# Patient Record
Sex: Female | Born: 1955 | Race: Black or African American | Hispanic: No | Marital: Married | Smoking: Never smoker
Health system: Southern US, Community
[De-identification: ages and names within clinical notes are randomized; demographics above are authoritative.]

## PROBLEM LIST (undated history)

## (undated) DIAGNOSIS — C801 Malignant (primary) neoplasm, unspecified: Secondary | ICD-10-CM

## (undated) DIAGNOSIS — K859 Acute pancreatitis without necrosis or infection, unspecified: Secondary | ICD-10-CM

## (undated) DIAGNOSIS — J189 Pneumonia, unspecified organism: Secondary | ICD-10-CM

## (undated) DIAGNOSIS — R519 Headache, unspecified: Secondary | ICD-10-CM

## (undated) DIAGNOSIS — I5030 Unspecified diastolic (congestive) heart failure: Secondary | ICD-10-CM

## (undated) DIAGNOSIS — E119 Type 2 diabetes mellitus without complications: Secondary | ICD-10-CM

## (undated) DIAGNOSIS — Z992 Dependence on renal dialysis: Secondary | ICD-10-CM

## (undated) DIAGNOSIS — E782 Mixed hyperlipidemia: Secondary | ICD-10-CM

## (undated) DIAGNOSIS — I639 Cerebral infarction, unspecified: Secondary | ICD-10-CM

## (undated) DIAGNOSIS — D649 Anemia, unspecified: Secondary | ICD-10-CM

## (undated) DIAGNOSIS — F419 Anxiety disorder, unspecified: Secondary | ICD-10-CM

## (undated) DIAGNOSIS — K219 Gastro-esophageal reflux disease without esophagitis: Secondary | ICD-10-CM

## (undated) DIAGNOSIS — E1165 Type 2 diabetes mellitus with hyperglycemia: Secondary | ICD-10-CM

## (undated) DIAGNOSIS — S88912A Complete traumatic amputation of left lower leg, level unspecified, initial encounter: Secondary | ICD-10-CM

## (undated) DIAGNOSIS — I509 Heart failure, unspecified: Secondary | ICD-10-CM

## (undated) DIAGNOSIS — F329 Major depressive disorder, single episode, unspecified: Secondary | ICD-10-CM

## (undated) DIAGNOSIS — F32A Depression, unspecified: Secondary | ICD-10-CM

## (undated) DIAGNOSIS — N6320 Unspecified lump in the left breast, unspecified quadrant: Secondary | ICD-10-CM

## (undated) DIAGNOSIS — Z9889 Other specified postprocedural states: Secondary | ICD-10-CM

## (undated) DIAGNOSIS — N186 End stage renal disease: Secondary | ICD-10-CM

## (undated) DIAGNOSIS — Z9289 Personal history of other medical treatment: Secondary | ICD-10-CM

## (undated) DIAGNOSIS — R413 Other amnesia: Secondary | ICD-10-CM

## (undated) DIAGNOSIS — Z8701 Personal history of pneumonia (recurrent): Secondary | ICD-10-CM

## (undated) DIAGNOSIS — M549 Dorsalgia, unspecified: Secondary | ICD-10-CM

## (undated) DIAGNOSIS — E669 Obesity, unspecified: Secondary | ICD-10-CM

## (undated) DIAGNOSIS — Z8659 Personal history of other mental and behavioral disorders: Secondary | ICD-10-CM

## (undated) DIAGNOSIS — M1990 Unspecified osteoarthritis, unspecified site: Secondary | ICD-10-CM

## (undated) DIAGNOSIS — I10 Essential (primary) hypertension: Secondary | ICD-10-CM

## (undated) DIAGNOSIS — I259 Chronic ischemic heart disease, unspecified: Secondary | ICD-10-CM

## (undated) DIAGNOSIS — G4730 Sleep apnea, unspecified: Secondary | ICD-10-CM

## (undated) HISTORY — PX: FOOT SURGERY: SHX648

## (undated) HISTORY — DX: Obesity, unspecified: E66.9

## (undated) HISTORY — DX: Type 2 diabetes mellitus without complications: E11.9

## (undated) HISTORY — DX: Essential (primary) hypertension: I10

## (undated) HISTORY — DX: Type 2 diabetes mellitus with hyperglycemia: E11.65

## (undated) HISTORY — DX: Other specified postprocedural states: Z98.890

## (undated) HISTORY — DX: Mixed hyperlipidemia: E78.2

## (undated) HISTORY — DX: Personal history of other mental and behavioral disorders: Z86.59

## (undated) HISTORY — DX: Other amnesia: R41.3

## (undated) HISTORY — DX: Complete traumatic amputation of left lower leg, level unspecified, initial encounter: S88.912A

## (undated) HISTORY — DX: Sleep apnea, unspecified: G47.30

## (undated) HISTORY — DX: Unspecified lump in the left breast, unspecified quadrant: N63.20

## (undated) HISTORY — DX: Anemia, unspecified: D64.9

## (undated) HISTORY — DX: Dorsalgia, unspecified: M54.9

## (undated) HISTORY — PX: ABDOMINAL HYSTERECTOMY: SHX81

## (undated) HISTORY — DX: Chronic ischemic heart disease, unspecified: I25.9

## (undated) HISTORY — PX: LUNG BIOPSY: SHX232

---
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
## 1997-12-03 ENCOUNTER — Inpatient Hospital Stay (HOSPITAL_COMMUNITY): Admission: EM | Admit: 1997-12-03 | Discharge: 1997-12-05 | Payer: Self-pay | Admitting: Internal Medicine

## 2001-03-15 ENCOUNTER — Emergency Department (HOSPITAL_COMMUNITY): Admission: EM | Admit: 2001-03-15 | Discharge: 2001-03-15 | Payer: Self-pay | Admitting: Emergency Medicine

## 2001-04-20 ENCOUNTER — Other Ambulatory Visit: Admission: RE | Admit: 2001-04-20 | Discharge: 2001-04-20 | Payer: Self-pay | Admitting: Unknown Physician Specialty

## 2001-04-21 ENCOUNTER — Encounter: Payer: Self-pay | Admitting: Family Medicine

## 2001-04-21 ENCOUNTER — Ambulatory Visit (HOSPITAL_COMMUNITY): Admission: RE | Admit: 2001-04-21 | Discharge: 2001-04-21 | Payer: Self-pay | Admitting: Family Medicine

## 2001-05-05 ENCOUNTER — Other Ambulatory Visit: Admission: RE | Admit: 2001-05-05 | Discharge: 2001-05-05 | Payer: Self-pay | Admitting: Orthopaedic Surgery

## 2001-05-23 ENCOUNTER — Encounter: Payer: Self-pay | Admitting: Family Medicine

## 2001-05-23 ENCOUNTER — Ambulatory Visit (HOSPITAL_COMMUNITY): Admission: RE | Admit: 2001-05-23 | Discharge: 2001-05-23 | Payer: Self-pay | Admitting: Family Medicine

## 2001-09-19 ENCOUNTER — Ambulatory Visit (HOSPITAL_COMMUNITY): Admission: RE | Admit: 2001-09-19 | Discharge: 2001-09-19 | Payer: Self-pay | Admitting: Family Medicine

## 2001-09-19 ENCOUNTER — Encounter: Payer: Self-pay | Admitting: Family Medicine

## 2002-07-05 ENCOUNTER — Encounter: Payer: Self-pay | Admitting: Family Medicine

## 2002-07-05 ENCOUNTER — Ambulatory Visit (HOSPITAL_COMMUNITY): Admission: RE | Admit: 2002-07-05 | Discharge: 2002-07-05 | Payer: Self-pay | Admitting: Family Medicine

## 2003-08-16 ENCOUNTER — Ambulatory Visit (HOSPITAL_COMMUNITY): Admission: RE | Admit: 2003-08-16 | Discharge: 2003-08-16 | Payer: Self-pay | Admitting: Family Medicine

## 2003-12-07 ENCOUNTER — Ambulatory Visit (HOSPITAL_COMMUNITY): Admission: RE | Admit: 2003-12-07 | Discharge: 2003-12-07 | Payer: Self-pay | Admitting: Orthopedic Surgery

## 2003-12-17 ENCOUNTER — Encounter (HOSPITAL_COMMUNITY): Admission: RE | Admit: 2003-12-17 | Discharge: 2003-12-21 | Payer: Self-pay | Admitting: Orthopedic Surgery

## 2003-12-24 ENCOUNTER — Encounter (HOSPITAL_COMMUNITY): Admission: RE | Admit: 2003-12-24 | Discharge: 2004-01-23 | Payer: Self-pay | Admitting: Orthopedic Surgery

## 2004-01-24 ENCOUNTER — Ambulatory Visit: Payer: Self-pay | Admitting: Orthopedic Surgery

## 2004-01-29 ENCOUNTER — Ambulatory Visit (HOSPITAL_COMMUNITY): Admission: RE | Admit: 2004-01-29 | Discharge: 2004-01-29 | Payer: Self-pay | Admitting: Orthopedic Surgery

## 2004-01-29 ENCOUNTER — Ambulatory Visit: Payer: Self-pay | Admitting: Orthopedic Surgery

## 2004-01-31 ENCOUNTER — Ambulatory Visit: Payer: Self-pay | Admitting: Orthopedic Surgery

## 2004-02-07 ENCOUNTER — Ambulatory Visit: Payer: Self-pay | Admitting: Orthopedic Surgery

## 2004-02-18 ENCOUNTER — Ambulatory Visit: Payer: Self-pay | Admitting: Family Medicine

## 2004-02-21 ENCOUNTER — Ambulatory Visit: Payer: Self-pay | Admitting: Family Medicine

## 2004-02-26 ENCOUNTER — Ambulatory Visit: Payer: Self-pay | Admitting: Orthopedic Surgery

## 2004-03-05 ENCOUNTER — Ambulatory Visit: Payer: Self-pay | Admitting: Orthopedic Surgery

## 2004-03-26 ENCOUNTER — Ambulatory Visit: Payer: Self-pay | Admitting: Orthopedic Surgery

## 2004-04-03 ENCOUNTER — Encounter (HOSPITAL_COMMUNITY): Admission: RE | Admit: 2004-04-03 | Discharge: 2004-05-03 | Payer: Self-pay | Admitting: Orthopedic Surgery

## 2004-05-05 ENCOUNTER — Encounter (HOSPITAL_COMMUNITY): Admission: RE | Admit: 2004-05-05 | Discharge: 2004-06-04 | Payer: Self-pay | Admitting: Orthopedic Surgery

## 2004-07-14 ENCOUNTER — Ambulatory Visit: Payer: Self-pay | Admitting: Family Medicine

## 2004-08-07 ENCOUNTER — Ambulatory Visit: Payer: Self-pay | Admitting: Orthopedic Surgery

## 2004-11-25 ENCOUNTER — Ambulatory Visit (HOSPITAL_COMMUNITY): Admission: RE | Admit: 2004-11-25 | Discharge: 2004-11-25 | Payer: Self-pay | Admitting: Family Medicine

## 2005-01-06 ENCOUNTER — Ambulatory Visit: Payer: Self-pay | Admitting: Family Medicine

## 2005-04-02 ENCOUNTER — Ambulatory Visit: Payer: Self-pay | Admitting: Orthopedic Surgery

## 2005-06-29 ENCOUNTER — Ambulatory Visit (HOSPITAL_COMMUNITY): Admission: RE | Admit: 2005-06-29 | Discharge: 2005-06-29 | Payer: Self-pay | Admitting: Family Medicine

## 2005-07-01 ENCOUNTER — Ambulatory Visit: Payer: Self-pay | Admitting: Family Medicine

## 2005-07-01 ENCOUNTER — Other Ambulatory Visit: Admission: RE | Admit: 2005-07-01 | Discharge: 2005-07-01 | Payer: Self-pay | Admitting: Family Medicine

## 2005-08-12 ENCOUNTER — Ambulatory Visit: Payer: Self-pay | Admitting: Family Medicine

## 2005-10-08 ENCOUNTER — Ambulatory Visit: Payer: Self-pay | Admitting: Family Medicine

## 2005-11-03 ENCOUNTER — Ambulatory Visit: Payer: Self-pay | Admitting: Family Medicine

## 2006-01-18 ENCOUNTER — Ambulatory Visit (HOSPITAL_COMMUNITY): Admission: RE | Admit: 2006-01-18 | Discharge: 2006-01-18 | Payer: Self-pay | Admitting: Family Medicine

## 2006-02-24 ENCOUNTER — Ambulatory Visit: Payer: Self-pay | Admitting: Family Medicine

## 2006-03-23 HISTORY — PX: COLONOSCOPY: SHX174

## 2006-04-06 ENCOUNTER — Ambulatory Visit: Payer: Self-pay | Admitting: Family Medicine

## 2006-04-19 ENCOUNTER — Ambulatory Visit (HOSPITAL_COMMUNITY): Admission: RE | Admit: 2006-04-19 | Discharge: 2006-04-19 | Payer: Self-pay | Admitting: Family Medicine

## 2006-04-19 ENCOUNTER — Ambulatory Visit: Payer: Self-pay | Admitting: Family Medicine

## 2006-05-17 ENCOUNTER — Ambulatory Visit: Payer: Self-pay | Admitting: Family Medicine

## 2006-06-07 ENCOUNTER — Ambulatory Visit: Payer: Self-pay | Admitting: Gastroenterology

## 2006-06-07 ENCOUNTER — Ambulatory Visit (HOSPITAL_COMMUNITY): Admission: RE | Admit: 2006-06-07 | Discharge: 2006-06-07 | Payer: Self-pay | Admitting: Gastroenterology

## 2006-08-11 ENCOUNTER — Ambulatory Visit: Payer: Self-pay | Admitting: Family Medicine

## 2006-08-20 ENCOUNTER — Encounter: Payer: Self-pay | Admitting: Family Medicine

## 2006-08-20 LAB — CONVERTED CEMR LAB
ALT: 45 units/L — ABNORMAL HIGH (ref 0–35)
AST: 32 units/L (ref 0–37)
Total Bilirubin: 0.2 mg/dL — ABNORMAL LOW (ref 0.3–1.2)
Total Protein: 8.2 g/dL (ref 6.0–8.3)
VLDL: 22 mg/dL (ref 0–40)

## 2006-08-24 ENCOUNTER — Other Ambulatory Visit: Admission: RE | Admit: 2006-08-24 | Discharge: 2006-08-24 | Payer: Self-pay | Admitting: Family Medicine

## 2006-08-24 ENCOUNTER — Encounter (INDEPENDENT_AMBULATORY_CARE_PROVIDER_SITE_OTHER): Payer: Self-pay | Admitting: *Deleted

## 2006-08-24 ENCOUNTER — Ambulatory Visit: Payer: Self-pay | Admitting: Family Medicine

## 2006-08-24 ENCOUNTER — Encounter: Payer: Self-pay | Admitting: Family Medicine

## 2006-08-24 LAB — CONVERTED CEMR LAB: Pap Smear: NORMAL

## 2006-09-02 ENCOUNTER — Ambulatory Visit: Payer: Self-pay | Admitting: Family Medicine

## 2006-09-20 ENCOUNTER — Encounter: Payer: Self-pay | Admitting: Family Medicine

## 2006-09-20 LAB — CONVERTED CEMR LAB: Pap Smear: NORMAL

## 2006-12-03 ENCOUNTER — Encounter: Payer: Self-pay | Admitting: Family Medicine

## 2006-12-03 LAB — CONVERTED CEMR LAB
ALT: 41 units/L — ABNORMAL HIGH (ref 0–35)
AST: 30 units/L (ref 0–37)
Albumin: 5 g/dL (ref 3.5–5.2)
Alkaline Phosphatase: 74 units/L (ref 39–117)
CO2: 22 meq/L (ref 19–32)
Glucose, Bld: 89 mg/dL (ref 70–99)
LDL Cholesterol: 159 mg/dL — ABNORMAL HIGH (ref 0–99)
Microalb, Ur: 3.97 mg/dL — ABNORMAL HIGH (ref 0.00–1.89)
Sodium: 140 meq/L (ref 135–145)
Total Protein: 8.7 g/dL — ABNORMAL HIGH (ref 6.0–8.3)

## 2006-12-07 ENCOUNTER — Ambulatory Visit: Payer: Self-pay | Admitting: Family Medicine

## 2006-12-07 LAB — CONVERTED CEMR LAB
Helicobacter Pylori Antibody-IgG: 0.9
Hep A IgM: NEGATIVE

## 2006-12-09 ENCOUNTER — Ambulatory Visit (HOSPITAL_COMMUNITY): Admission: RE | Admit: 2006-12-09 | Discharge: 2006-12-09 | Payer: Self-pay | Admitting: Family Medicine

## 2006-12-15 ENCOUNTER — Encounter (HOSPITAL_COMMUNITY): Admission: RE | Admit: 2006-12-15 | Discharge: 2006-12-21 | Payer: Self-pay | Admitting: Family Medicine

## 2007-01-26 ENCOUNTER — Ambulatory Visit: Payer: Self-pay | Admitting: Family Medicine

## 2007-03-24 ENCOUNTER — Encounter: Payer: Self-pay | Admitting: Family Medicine

## 2007-05-03 ENCOUNTER — Ambulatory Visit (HOSPITAL_COMMUNITY): Admission: RE | Admit: 2007-05-03 | Discharge: 2007-05-03 | Payer: Self-pay | Admitting: Family Medicine

## 2007-05-06 ENCOUNTER — Encounter: Payer: Self-pay | Admitting: Family Medicine

## 2007-05-06 LAB — CONVERTED CEMR LAB
ALT: 38 units/L — ABNORMAL HIGH (ref 0–35)
Albumin: 4.6 g/dL (ref 3.5–5.2)
BUN: 26 mg/dL — ABNORMAL HIGH (ref 6–23)
Bilirubin, Direct: 0.1 mg/dL (ref 0.0–0.3)
CO2: 19 meq/L (ref 19–32)
Calcium: 10 mg/dL (ref 8.4–10.5)
Creatinine, Ser: 1.33 mg/dL — ABNORMAL HIGH (ref 0.40–1.20)
Glucose, Bld: 99 mg/dL (ref 70–99)
HDL: 37 mg/dL — ABNORMAL LOW (ref >39)
Potassium: 4.3 meq/L (ref 3.5–5.3)
Total Bilirubin: 0.3 mg/dL (ref 0.3–1.2)
Triglycerides: 188 mg/dL — ABNORMAL HIGH (ref <150)
VLDL: 38 mg/dL (ref 0–40)

## 2007-05-12 ENCOUNTER — Ambulatory Visit: Payer: Self-pay | Admitting: Family Medicine

## 2007-05-16 ENCOUNTER — Encounter: Payer: Self-pay | Admitting: Family Medicine

## 2007-05-16 LAB — CONVERTED CEMR LAB
Calcium: 10.1 mg/dL (ref 8.4–10.5)
Potassium: 3.8 meq/L (ref 3.5–5.3)
Sodium: 138 meq/L (ref 135–145)

## 2007-06-01 ENCOUNTER — Encounter: Payer: Self-pay | Admitting: Endocrinology

## 2007-06-01 ENCOUNTER — Ambulatory Visit: Payer: Self-pay | Admitting: Family Medicine

## 2007-06-02 ENCOUNTER — Encounter: Payer: Self-pay | Admitting: Family Medicine

## 2007-06-07 ENCOUNTER — Encounter: Payer: Self-pay | Admitting: Family Medicine

## 2007-06-07 DIAGNOSIS — E785 Hyperlipidemia, unspecified: Secondary | ICD-10-CM | POA: Insufficient documentation

## 2007-06-07 DIAGNOSIS — E1122 Type 2 diabetes mellitus with diabetic chronic kidney disease: Secondary | ICD-10-CM

## 2007-06-07 DIAGNOSIS — G4730 Sleep apnea, unspecified: Secondary | ICD-10-CM | POA: Insufficient documentation

## 2007-06-07 DIAGNOSIS — E1140 Type 2 diabetes mellitus with diabetic neuropathy, unspecified: Secondary | ICD-10-CM

## 2007-06-07 DIAGNOSIS — E119 Type 2 diabetes mellitus without complications: Secondary | ICD-10-CM | POA: Insufficient documentation

## 2007-06-07 DIAGNOSIS — E782 Mixed hyperlipidemia: Secondary | ICD-10-CM

## 2007-06-07 DIAGNOSIS — IMO0002 Reserved for concepts with insufficient information to code with codable children: Secondary | ICD-10-CM

## 2007-06-07 DIAGNOSIS — E669 Obesity, unspecified: Secondary | ICD-10-CM | POA: Insufficient documentation

## 2007-06-07 DIAGNOSIS — N186 End stage renal disease: Secondary | ICD-10-CM

## 2007-06-07 DIAGNOSIS — I10 Essential (primary) hypertension: Secondary | ICD-10-CM | POA: Insufficient documentation

## 2007-06-07 DIAGNOSIS — E1165 Type 2 diabetes mellitus with hyperglycemia: Secondary | ICD-10-CM

## 2007-06-08 ENCOUNTER — Ambulatory Visit: Payer: Self-pay | Admitting: Endocrinology

## 2007-06-08 DIAGNOSIS — E0590 Thyrotoxicosis, unspecified without thyrotoxic crisis or storm: Secondary | ICD-10-CM | POA: Insufficient documentation

## 2007-06-14 ENCOUNTER — Encounter (HOSPITAL_COMMUNITY): Admission: RE | Admit: 2007-06-14 | Discharge: 2007-07-14 | Payer: Self-pay | Admitting: Endocrinology

## 2007-06-22 ENCOUNTER — Ambulatory Visit: Payer: Self-pay | Admitting: Family Medicine

## 2007-06-30 ENCOUNTER — Encounter (INDEPENDENT_AMBULATORY_CARE_PROVIDER_SITE_OTHER): Payer: Self-pay | Admitting: *Deleted

## 2007-07-15 ENCOUNTER — Emergency Department (HOSPITAL_COMMUNITY): Admission: EM | Admit: 2007-07-15 | Discharge: 2007-07-15 | Payer: Self-pay | Admitting: Emergency Medicine

## 2007-07-18 ENCOUNTER — Ambulatory Visit: Payer: Self-pay | Admitting: Family Medicine

## 2007-07-18 LAB — CONVERTED CEMR LAB
Calcium: 9.9 mg/dL (ref 8.4–10.5)
Chloride: 104 meq/L (ref 96–112)
Creatinine, Ser: 1.01 mg/dL (ref 0.40–1.20)
Sodium: 142 meq/L (ref 135–145)

## 2007-08-24 ENCOUNTER — Encounter: Payer: Self-pay | Admitting: Family Medicine

## 2007-08-24 ENCOUNTER — Ambulatory Visit: Payer: Self-pay | Admitting: Family Medicine

## 2007-08-24 DIAGNOSIS — L259 Unspecified contact dermatitis, unspecified cause: Secondary | ICD-10-CM | POA: Insufficient documentation

## 2007-08-24 LAB — CONVERTED CEMR LAB
Glucose, Bld: 120 mg/dL
Hgb A1c MFr Bld: 6.5 %

## 2007-11-07 ENCOUNTER — Encounter: Payer: Self-pay | Admitting: Family Medicine

## 2007-11-15 ENCOUNTER — Encounter: Payer: Self-pay | Admitting: Family Medicine

## 2007-12-09 ENCOUNTER — Ambulatory Visit: Payer: Self-pay | Admitting: Endocrinology

## 2007-12-09 LAB — CONVERTED CEMR LAB
Free T4: 0.8 ng/dL (ref 0.6–1.6)
TSH: 3.08 microintl units/mL (ref 0.35–5.50)

## 2008-01-10 ENCOUNTER — Encounter: Payer: Self-pay | Admitting: Family Medicine

## 2008-01-16 ENCOUNTER — Ambulatory Visit: Payer: Self-pay | Admitting: Family Medicine

## 2008-01-16 LAB — CONVERTED CEMR LAB: Glucose, Bld: 167 mg/dL

## 2008-01-17 ENCOUNTER — Encounter: Payer: Self-pay | Admitting: Family Medicine

## 2008-01-17 LAB — CONVERTED CEMR LAB: Microalb Creat Ratio: 426.4 mg/g — ABNORMAL HIGH (ref 0.0–30.0)

## 2008-01-23 ENCOUNTER — Encounter: Payer: Self-pay | Admitting: Family Medicine

## 2008-01-24 ENCOUNTER — Encounter: Payer: Self-pay | Admitting: Family Medicine

## 2008-01-24 LAB — CONVERTED CEMR LAB
ALT: 25 units/L (ref 0–35)
AST: 21 units/L (ref 0–37)
Albumin: 4.6 g/dL (ref 3.5–5.2)
Bilirubin, Direct: 0.1 mg/dL (ref 0.0–0.3)
Calcium: 9.4 mg/dL (ref 8.4–10.5)
Chloride: 104 meq/L (ref 96–112)
Creatinine, Ser: 1.04 mg/dL (ref 0.40–1.20)
Eosinophils Relative: 1 % (ref 0–5)
Glucose, Bld: 165 mg/dL — ABNORMAL HIGH (ref 70–99)
Hemoglobin: 10.9 g/dL — ABNORMAL LOW (ref 12.0–15.0)
LDL Cholesterol: 137 mg/dL — ABNORMAL HIGH (ref 0–99)
Lymphs Abs: 2.5 10*3/uL (ref 0.7–4.0)
MCHC: 30.9 g/dL (ref 30.0–36.0)
Monocytes Absolute: 0.4 10*3/uL (ref 0.1–1.0)
Neutro Abs: 2.1 10*3/uL (ref 1.7–7.7)
RDW: 16.4 % — ABNORMAL HIGH (ref 11.5–15.5)
Sodium: 143 meq/L (ref 135–145)
Total Bilirubin: 0.3 mg/dL (ref 0.3–1.2)
Total Protein: 7.7 g/dL (ref 6.0–8.3)
VLDL: 47 mg/dL — ABNORMAL HIGH (ref 0–40)
WBC: 5.2 10*3/uL (ref 4.0–10.5)

## 2008-02-01 ENCOUNTER — Telehealth: Payer: Self-pay | Admitting: Family Medicine

## 2008-02-01 ENCOUNTER — Ambulatory Visit: Payer: Self-pay | Admitting: Family Medicine

## 2008-02-07 ENCOUNTER — Encounter: Payer: Self-pay | Admitting: Family Medicine

## 2008-03-22 ENCOUNTER — Telehealth: Payer: Self-pay | Admitting: Family Medicine

## 2008-03-22 ENCOUNTER — Ambulatory Visit: Payer: Self-pay | Admitting: Family Medicine

## 2008-04-11 ENCOUNTER — Encounter: Payer: Self-pay | Admitting: Family Medicine

## 2008-04-18 ENCOUNTER — Ambulatory Visit: Payer: Self-pay | Admitting: Family Medicine

## 2008-04-18 DIAGNOSIS — R809 Proteinuria, unspecified: Secondary | ICD-10-CM | POA: Insufficient documentation

## 2008-04-20 ENCOUNTER — Ambulatory Visit (HOSPITAL_COMMUNITY): Admission: RE | Admit: 2008-04-20 | Discharge: 2008-04-20 | Payer: Self-pay | Admitting: Family Medicine

## 2008-04-24 ENCOUNTER — Encounter (HOSPITAL_COMMUNITY): Admission: RE | Admit: 2008-04-24 | Discharge: 2008-05-24 | Payer: Self-pay | Admitting: Oncology

## 2008-04-24 ENCOUNTER — Encounter: Payer: Self-pay | Admitting: Family Medicine

## 2008-04-24 ENCOUNTER — Ambulatory Visit (HOSPITAL_COMMUNITY): Payer: Self-pay | Admitting: Oncology

## 2008-05-04 ENCOUNTER — Ambulatory Visit (HOSPITAL_COMMUNITY): Admission: RE | Admit: 2008-05-04 | Discharge: 2008-05-04 | Payer: Self-pay | Admitting: Family Medicine

## 2008-05-11 ENCOUNTER — Encounter: Payer: Self-pay | Admitting: Family Medicine

## 2008-05-15 ENCOUNTER — Encounter: Payer: Self-pay | Admitting: Family Medicine

## 2008-05-28 ENCOUNTER — Encounter: Payer: Self-pay | Admitting: Family Medicine

## 2008-05-29 ENCOUNTER — Encounter: Payer: Self-pay | Admitting: Family Medicine

## 2008-05-29 ENCOUNTER — Ambulatory Visit (HOSPITAL_COMMUNITY): Admission: RE | Admit: 2008-05-29 | Discharge: 2008-05-29 | Payer: Self-pay | Admitting: Nephrology

## 2008-05-30 ENCOUNTER — Ambulatory Visit: Payer: Self-pay | Admitting: Family Medicine

## 2008-05-30 DIAGNOSIS — M79603 Pain in arm, unspecified: Secondary | ICD-10-CM | POA: Insufficient documentation

## 2008-05-30 LAB — CONVERTED CEMR LAB: Glucose, Bld: 98 mg/dL

## 2008-05-31 ENCOUNTER — Encounter: Payer: Self-pay | Admitting: Family Medicine

## 2008-06-01 ENCOUNTER — Encounter: Payer: Self-pay | Admitting: Family Medicine

## 2008-06-05 DIAGNOSIS — D539 Nutritional anemia, unspecified: Secondary | ICD-10-CM | POA: Insufficient documentation

## 2008-06-05 DIAGNOSIS — L930 Discoid lupus erythematosus: Secondary | ICD-10-CM | POA: Insufficient documentation

## 2008-06-07 ENCOUNTER — Encounter: Payer: Self-pay | Admitting: Family Medicine

## 2008-06-07 ENCOUNTER — Telehealth: Payer: Self-pay | Admitting: Family Medicine

## 2008-06-11 ENCOUNTER — Ambulatory Visit: Payer: Self-pay | Admitting: Family Medicine

## 2008-06-11 ENCOUNTER — Telehealth: Payer: Self-pay | Admitting: Family Medicine

## 2008-06-13 ENCOUNTER — Encounter: Payer: Self-pay | Admitting: Family Medicine

## 2008-06-21 ENCOUNTER — Encounter: Payer: Self-pay | Admitting: Family Medicine

## 2008-08-09 ENCOUNTER — Encounter: Payer: Self-pay | Admitting: Family Medicine

## 2008-08-23 ENCOUNTER — Ambulatory Visit (HOSPITAL_COMMUNITY): Admission: RE | Admit: 2008-08-23 | Discharge: 2008-08-23 | Payer: Self-pay | Admitting: Urology

## 2008-08-23 ENCOUNTER — Encounter: Payer: Self-pay | Admitting: Family Medicine

## 2008-09-07 ENCOUNTER — Encounter: Payer: Self-pay | Admitting: Family Medicine

## 2008-09-12 ENCOUNTER — Ambulatory Visit: Payer: Self-pay | Admitting: Family Medicine

## 2008-09-12 DIAGNOSIS — N63 Unspecified lump in unspecified breast: Secondary | ICD-10-CM | POA: Insufficient documentation

## 2008-09-20 ENCOUNTER — Ambulatory Visit (HOSPITAL_COMMUNITY): Admission: RE | Admit: 2008-09-20 | Discharge: 2008-09-20 | Payer: Self-pay | Admitting: Family Medicine

## 2008-11-08 ENCOUNTER — Encounter: Payer: Self-pay | Admitting: Family Medicine

## 2009-01-03 ENCOUNTER — Ambulatory Visit (HOSPITAL_COMMUNITY): Payer: Self-pay | Admitting: Oncology

## 2009-01-03 ENCOUNTER — Encounter (HOSPITAL_COMMUNITY): Admission: RE | Admit: 2009-01-03 | Discharge: 2009-02-02 | Payer: Self-pay | Admitting: Oncology

## 2009-01-04 ENCOUNTER — Encounter: Payer: Self-pay | Admitting: Family Medicine

## 2009-01-18 ENCOUNTER — Encounter: Payer: Self-pay | Admitting: Family Medicine

## 2009-01-26 LAB — CONVERTED CEMR LAB
ALT: 32 units/L (ref 0–35)
BUN: 24 mg/dL — ABNORMAL HIGH (ref 6–23)
Basophils Absolute: 0 10*3/uL (ref 0.0–0.1)
Basophils Relative: 1 % (ref 0–1)
Bilirubin, Direct: 0 mg/dL (ref 0.0–0.3)
CO2: 25 meq/L (ref 19–32)
Cholesterol: 203 mg/dL — ABNORMAL HIGH (ref 0–200)
Eosinophils Relative: 2 % (ref 0–5)
Glucose, Bld: 113 mg/dL — ABNORMAL HIGH (ref 70–99)
HCT: 36.6 % (ref 36.0–46.0)
Hemoglobin: 11.1 g/dL — ABNORMAL LOW (ref 12.0–15.0)
Indirect Bilirubin: 0.3 mg/dL (ref 0.0–0.9)
LDL Cholesterol: 123 mg/dL — ABNORMAL HIGH (ref 0–99)
Lymphocytes Relative: 53 % — ABNORMAL HIGH (ref 12–46)
MCHC: 30.3 g/dL (ref 30.0–36.0)
Monocytes Absolute: 0.4 10*3/uL (ref 0.1–1.0)
Potassium: 4.3 meq/L (ref 3.5–5.3)
RDW: 16.2 % — ABNORMAL HIGH (ref 11.5–15.5)
Total Bilirubin: 0.3 mg/dL (ref 0.3–1.2)
VLDL: 43 mg/dL — ABNORMAL HIGH (ref 0–40)

## 2009-01-28 ENCOUNTER — Encounter: Payer: Self-pay | Admitting: Family Medicine

## 2009-01-28 ENCOUNTER — Ambulatory Visit: Payer: Self-pay | Admitting: Family Medicine

## 2009-01-28 ENCOUNTER — Other Ambulatory Visit: Admission: RE | Admit: 2009-01-28 | Discharge: 2009-01-28 | Payer: Self-pay | Admitting: Family Medicine

## 2009-01-28 LAB — CONVERTED CEMR LAB
Glucose, Bld: 216 mg/dL
OCCULT 1: NEGATIVE

## 2009-01-29 ENCOUNTER — Encounter: Payer: Self-pay | Admitting: Family Medicine

## 2009-02-01 LAB — CONVERTED CEMR LAB
Creatinine, Urine: 57.5 mg/dL
Microalb, Ur: 35.96 mg/dL — ABNORMAL HIGH (ref 0.00–1.89)
TSH: 3.751 microintl units/mL (ref 0.350–4.500)

## 2009-02-05 ENCOUNTER — Encounter: Payer: Self-pay | Admitting: Family Medicine

## 2009-03-28 ENCOUNTER — Encounter: Payer: Self-pay | Admitting: Family Medicine

## 2009-04-16 ENCOUNTER — Encounter: Payer: Self-pay | Admitting: Family Medicine

## 2009-05-02 ENCOUNTER — Encounter (INDEPENDENT_AMBULATORY_CARE_PROVIDER_SITE_OTHER): Payer: Self-pay | Admitting: *Deleted

## 2009-05-08 ENCOUNTER — Ambulatory Visit: Payer: Self-pay | Admitting: Family Medicine

## 2009-05-08 DIAGNOSIS — Z8639 Personal history of other endocrine, nutritional and metabolic disease: Secondary | ICD-10-CM

## 2009-05-08 DIAGNOSIS — Z8669 Personal history of other diseases of the nervous system and sense organs: Secondary | ICD-10-CM | POA: Insufficient documentation

## 2009-05-08 DIAGNOSIS — M25559 Pain in unspecified hip: Secondary | ICD-10-CM | POA: Insufficient documentation

## 2009-05-08 DIAGNOSIS — M25529 Pain in unspecified elbow: Secondary | ICD-10-CM | POA: Insufficient documentation

## 2009-05-08 DIAGNOSIS — Z862 Personal history of diseases of the blood and blood-forming organs and certain disorders involving the immune mechanism: Secondary | ICD-10-CM

## 2009-05-08 LAB — CONVERTED CEMR LAB
ALT: 36 units/L — ABNORMAL HIGH (ref 0–35)
Alkaline Phosphatase: 68 units/L (ref 39–117)
BUN: 15 mg/dL (ref 6–23)
Bilirubin, Direct: <0.1 mg/dL (ref 0.0–0.3)
Calcium: 9.8 mg/dL (ref 8.4–10.5)
Cholesterol: 208 mg/dL — ABNORMAL HIGH (ref 0–200)
Creatinine, Ser: 0.87 mg/dL (ref 0.40–1.20)
Glucose, Bld: 98 mg/dL (ref 70–99)
Total Protein: 7.9 g/dL (ref 6.0–8.3)
Triglycerides: 178 mg/dL — ABNORMAL HIGH (ref <150)

## 2009-07-02 ENCOUNTER — Ambulatory Visit (HOSPITAL_COMMUNITY): Admission: RE | Admit: 2009-07-02 | Discharge: 2009-07-02 | Payer: Self-pay | Admitting: Family Medicine

## 2009-07-18 ENCOUNTER — Telehealth: Payer: Self-pay | Admitting: Family Medicine

## 2009-07-24 ENCOUNTER — Ambulatory Visit: Payer: Self-pay | Admitting: Family Medicine

## 2009-07-24 DIAGNOSIS — R5383 Other fatigue: Secondary | ICD-10-CM

## 2009-07-24 DIAGNOSIS — D649 Anemia, unspecified: Secondary | ICD-10-CM | POA: Insufficient documentation

## 2009-07-24 DIAGNOSIS — R5381 Other malaise: Secondary | ICD-10-CM | POA: Insufficient documentation

## 2009-07-24 LAB — CONVERTED CEMR LAB
AST: 38 units/L — ABNORMAL HIGH (ref 0–37)
Albumin: 4.4 g/dL (ref 3.5–5.2)
Alkaline Phosphatase: 67 units/L (ref 39–117)
Indirect Bilirubin: 0.2 mg/dL (ref 0.0–0.9)
Total Protein: 7.2 g/dL (ref 6.0–8.3)

## 2009-07-25 ENCOUNTER — Encounter: Payer: Self-pay | Admitting: Family Medicine

## 2009-08-16 ENCOUNTER — Encounter: Payer: Self-pay | Admitting: Family Medicine

## 2009-10-14 ENCOUNTER — Telehealth: Payer: Self-pay | Admitting: Family Medicine

## 2009-10-22 ENCOUNTER — Telehealth: Payer: Self-pay | Admitting: Family Medicine

## 2009-11-27 ENCOUNTER — Ambulatory Visit: Payer: Self-pay | Admitting: Family Medicine

## 2009-12-06 ENCOUNTER — Encounter: Payer: Self-pay | Admitting: Family Medicine

## 2009-12-10 ENCOUNTER — Encounter: Payer: Self-pay | Admitting: Family Medicine

## 2010-03-03 ENCOUNTER — Ambulatory Visit: Payer: Self-pay | Admitting: Family Medicine

## 2010-03-03 DIAGNOSIS — J209 Acute bronchitis, unspecified: Secondary | ICD-10-CM | POA: Insufficient documentation

## 2010-03-07 ENCOUNTER — Encounter: Payer: Self-pay | Admitting: Family Medicine

## 2010-03-28 ENCOUNTER — Emergency Department (HOSPITAL_COMMUNITY)
Admission: EM | Admit: 2010-03-28 | Discharge: 2010-03-28 | Payer: Self-pay | Source: Home / Self Care | Admitting: Emergency Medicine

## 2010-04-02 LAB — CONVERTED CEMR LAB
ALT: 37 U/L — ABNORMAL HIGH (ref 0–35)
AST: 30 U/L (ref 0–37)
Albumin: 4.6 g/dL (ref 3.5–5.2)
Alkaline Phosphatase: 69 U/L (ref 39–117)
BUN: 31 mg/dL — ABNORMAL HIGH (ref 6–23)
Bilirubin, Direct: <0.1 mg/dL (ref 0.0–0.3)
CO2: 22 meq/L (ref 19–32)
Calcium: 10.3 mg/dL (ref 8.4–10.5)
Chloride: 105 meq/L (ref 96–112)
Cholesterol: 234 mg/dL — ABNORMAL HIGH (ref 0–200)
Creatinine, Ser: 1.25 mg/dL — ABNORMAL HIGH (ref 0.40–1.20)
Creatinine, Urine: 135.4 mg/dL
Glucose, Bld: 129 mg/dL — ABNORMAL HIGH (ref 70–99)
HDL: 44 mg/dL (ref >39)
Hgb A1c MFr Bld: 8.5 % — ABNORMAL HIGH (ref <5.7)
LDL Cholesterol: 158 mg/dL — ABNORMAL HIGH (ref 0–99)
Microalb Creat Ratio: 343.7 mg/g — ABNORMAL HIGH (ref 0.0–30.0)
Microalb, Ur: 46.54 mg/dL — ABNORMAL HIGH (ref 0.00–1.89)
Potassium: 4.8 meq/L (ref 3.5–5.3)
Sodium: 143 meq/L (ref 135–145)
Total Bilirubin: 0.3 mg/dL (ref 0.3–1.2)
Total CHOL/HDL Ratio: 5.3
Total Protein: 7.8 g/dL (ref 6.0–8.3)
Triglycerides: 159 mg/dL — ABNORMAL HIGH (ref <150)
VLDL: 32 mg/dL (ref 0–40)

## 2010-04-13 ENCOUNTER — Encounter: Payer: Self-pay | Admitting: Family Medicine

## 2010-04-16 ENCOUNTER — Ambulatory Visit
Admission: RE | Admit: 2010-04-16 | Discharge: 2010-04-16 | Payer: Self-pay | Source: Home / Self Care | Attending: Family Medicine | Admitting: Family Medicine

## 2010-04-16 DIAGNOSIS — M5441 Lumbago with sciatica, right side: Secondary | ICD-10-CM | POA: Insufficient documentation

## 2010-04-16 DIAGNOSIS — G629 Polyneuropathy, unspecified: Secondary | ICD-10-CM | POA: Insufficient documentation

## 2010-04-18 ENCOUNTER — Telehealth: Payer: Self-pay | Admitting: Family Medicine

## 2010-04-22 ENCOUNTER — Ambulatory Visit: Admit: 2010-04-22 | Payer: Self-pay | Admitting: Family Medicine

## 2010-04-22 NOTE — Letter (Signed)
Summary: OFFICE NOTES  OFFICE NOTES   Imported By: Dierdre Harness 09/12/2009 11:41:22  _____________________________________________________________________  External Attachment:    Type:   Image     Comment:   External Document

## 2010-04-22 NOTE — Assessment & Plan Note (Signed)
Summary: office visit   Vital Signs:  Patient profile:   55 year old female Menstrual status:  hysterectomy Height:      63 inches Weight:      212.25 pounds BMI:     37.73 O2 Sat:      94 % Pulse rate:   83 / minute Pulse rhythm:   regular Resp:     16 per minute BP sitting:   146 / 82  (left arm) Cuff size:   large  Vitals Entered By: Kate Sable LPN (May  4, 624THL 075-GRM PM)  Nutrition Counseling: Patient's BMI is greater than 25 and therefore counseled on weight management options. CC: Follow up chronic problems, face has broke out again, she was going to dermatology in [place] but she doesn't go there now   CC:  Follow up chronic problems, face has broke out again, and she was going to dermatology in [place] but she doesn't go there now.  History of Present Illness: Pt reports that she statred feeling ill last Saturday , and the following day she noted swelling of the face with itching.  She has been eval by derm at [facility] and has been dx with lupus like ds, with severe sunsensitivity.  She feels drained, no energy , excessively sleep and has diarreah, headache and nausea., as if she will have heat stroke. prior to this she had been doing well.  Current Medications (verified): 1)  Triamterene-Hctz 75-50 Mg  Tabs (Triamterene-Hctz) .... Take 1 Tablet By Mouth Once A Day 2)  Blood Pressure Monitor/l Cuff   Misc (Misc. Devices) .... Uad 3)  Catapres 0.2 Mg Tabs (Clonidine Hcl) .... Take 1 Tab By Mouth At Bedtime 4)  Metformin Hcl 1000 Mg Tabs (Metformin Hcl) .... One Tab By Mouth Bid 5)  Flonase 50 Mcg/act Susp (Fluticasone Propionate) .... 2 Puffs Per Nostril Daily 6)  Accu-Check Compact Strips and Syringes .... Uad For Once Daily Testing 7)  Simcor 750-20 Mg Xr24h-Tab (Niacin-Simvastatin) .... Take 1 Tab By Mouth At Bedtime 8)  Benicar 20 Mg Tabs (Olmesartan Medoxomil) .... Take 1 Tablet By Mouth Once A Day 9)  Hydroxyzine Hcl 25 Mg Tabs (Hydroxyzine Hcl) .... Take 1  Tablet By Mouth Once A Day 10)  Losartan Potassium 50 Mg Tabs (Losartan Potassium) .... Take 1 Tablet By Mouth Once A Day 11)  Ibuprofen 800 Mg Tabs (Ibuprofen) .... Take 1 Tablet By Mouth Three Times A Day 12)  Hydroxychloroquine Sulfate 200 Mg Tabs (Hydroxychloroquine Sulfate) .... Take 1 Tablet By Mouth Two Times A Day As Needed  Allergies (verified): 1)  ! Pcn 2)  ! Ace Inhibitors  Review of Systems General:  Complains of fatigue and weakness; denies chills and fever. Eyes:  Denies blurring, discharge, and red eye. ENT:  Denies hoarseness, nasal congestion, sinus pressure, and sore throat. CV:  Denies chest pain or discomfort, palpitations, and swelling of feet. Resp:  Denies cough and sputum productive. GI:  Denies abdominal pain, constipation, diarrhea, nausea, and vomiting. GU:  Denies dysuria and urinary frequency. MS:  Complains of joint pain and stiffness. Derm:  Complains of itching and rash. Neuro:  Denies headaches, seizures, and sensation of room spinning. Psych:  Denies anxiety and depression. Endo:  Denies cold intolerance, excessive hunger, excessive thirst, excessive urination, heat intolerance, polyuria, and weight change; tesdts on avg once  or twice weeklytwic. Heme:  Denies abnormal bruising and bleeding. Allergy:  Complains of hives or rash; sun sensitivity which is ecxtreme.  Physical  Exam  General:  Well-developed,obese,in no acute distress; alert,appropriate and cooperative throughout examination HEENT: No facial asymmetry,  EOMI, No sinus tenderness, TM's Clear, oropharynx  pink and moist. Swelling of the face, with periorbital edema  Chest: Clear to auscultation bilaterally.  CVS: S1, S2, No murmurs, No S3.   Abd: Soft, Nontender.  MS: Adequate ROM spine,reduced in left hip, adequate in  shoulders and knees. Left elbow tender with reduced ROM Ext: No edema.   CNS: CN 2-12 intact, power tone and sensation normal throughout.   Skin: Intact, erythematous  macular rash on upper extremeties, and neck Psych: Good eye contact, normal affect.  Memory intact, not anxious or depressed appearing.   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 Take 1 tablet by mouth two times a day as needed Her updated medication list for this problem includes:    Ibuprofen 800 Mg Tabs (Ibuprofen) .Marland Kitchen... Take 1 tablet by mouth three times a day    Hydroxychloroquine Sulfate 200 Mg Tabs (Hydroxychloroquine sulfate) .Marland Kitchen... Take 1 tablet by mouth two times a day dermatology referral, chloroquin prescribed as before x 1 month  Problem # 2:  OBESITY (ICD-278.00) Assessment: Unchanged  Ht: 63 (07/24/2009)   Wt: 212.25 (07/24/2009)   BMI: 37.73 (07/24/2009)  Problem # 3:  HYPERTENSION (ICD-401.9) Assessment: Deteriorated  The following medications were removed from the medication list:    Benicar 20 Mg Tabs (Olmesartan medoxomil) .Marland Kitchen... Take 1 tablet by mouth once a day    Losartan Potassium 50 Mg Tabs (Losartan potassium) .Marland Kitchen... Take 1 tablet by mouth once a day Her updated medication list for this problem includes:    Triamterene-hctz 75-50 Mg Tabs (Triamterene-hctz) .Marland Kitchen... Take 1 tablet by mouth once a day    Catapres 0.2 Mg Tabs (Clonidine hcl) .Marland Kitchen... Take 1 tab by mouth at bedtime    Losartan Potassium 100 Mg Tabs (Losartan potassium) .Marland Kitchen... Take 1 tablet by mouth once a day  Orders: T-Basic Metabolic Panel (99991111)  BP today: 146/82, out  of one of her meds Prior BP: 120/82 (05/08/2009)  Labs Reviewed: K+: 4.3 (05/07/2009) Creat: : 0.87 (05/07/2009)   Chol: 208 (05/07/2009)   HDL: 41 (05/07/2009)   LDL: 131 (05/07/2009)   TG: 178 (05/07/2009)  Problem # 4:  HYPERLIPIDEMIA (ICD-272.4) Assessment: Comment Only  Her updated medication list for this problem includes:    Simcor 750-20 Mg Xr24h-tab (Niacin-simvastatin) .Marland Kitchen... Take 1 tab by mouth at bedtime  Orders: T-Hepatic Function [phone]) T-Lipid Profile [phone])  Labs Reviewed: SGOT: 38 (07/22/2009)   SGPT: 34 (07/22/2009)   HDL:41 (05/07/2009), 37 (01/26/2009)  LDL:131 (05/07/2009), 123 (01/26/2009)  Chol:208 (05/07/2009), 203 (01/26/2009)  Trig:178 (05/07/2009), 216 (01/26/2009), low fat diet discussed and  encouraged  Problem # 5:  DIABETES MELLITUS, TYPE II (ICD-250.00) Assessment: Comment Only  The following medications were removed from the medication list:    Benicar 20 Mg Tabs (Olmesartan medoxomil) .Marland Kitchen... Take 1 tablet by mouth once a day    Losartan Potassium 50 Mg Tabs (Losartan potassium) .Marland Kitchen... Take 1 tablet by mouth once a day Her updated medication list for this problem includes:    Metformin Hcl 1000 Mg Tabs (Metformin hcl) ..... One tab by mouth bid    Losartan Potassium 100 Mg Tabs (Losartan potassium) .Marland Kitchen... Take 1 tablet by mouth once a day  Orders: T- Hemoglobin A1C [id number])  Labs Reviewed: Creat: 0.87 (05/07/2009)    Reviewed HgBA1c results: 7.2 (05/08/2009)  7.5 (01/28/2009)  Complete Medication List: 1)  Triamterene-hctz 75-50 Mg Tabs (Triamterene-hctz) .... Take 1 tablet by mouth once a day 2)  Blood Pressure Monitor/l Cuff Misc (Misc. devices) .... Uad 3)  Catapres 0.2 Mg Tabs (Clonidine hcl) .... Take 1 tab by mouth at bedtime 4)  Metformin Hcl 1000 Mg Tabs (Metformin hcl) .... One tab by mouth bid 5)  Flonase 50 Mcg/act Susp (Fluticasone propionate) .... 2 puffs per nostril daily 6)  Accu-check Compact Strips and  Syringes  .... Uad for once daily testing 7)  Simcor 750-20 Mg Xr24h-tab (Niacin-simvastatin) .... Take 1 tab by mouth at bedtime 8)  Ibuprofen 800 Mg Tabs (Ibuprofen) .... Take 1 tablet by mouth three times a day 9)  Losartan Potassium 100 Mg Tabs (Losartan potassium) .... Take 1 tablet by mouth once a day 10)  Hydroxychloroquine Sulfate 200 Mg Tabs (Hydroxychloroquine sulfate) .... Take 1 tablet by mouth two times a day 11)  Hydroxyzine Hcl 25 Mg/ml Soln (Hydroxyzine hcl) .... Take 1 tablet by mouth three times a day as needed  Other Orders: T-CBC w/Diff [phone]) T-Vitamin D (25-Hydroxy) [id number]) T- * Misc. Laboratory test [phone]) Future Orders: Dermatology Referral (Derma) ... 07/25/2009  Patient Instructions: 1)  F/U in 6 to7  weeks. 2)  BMP prior to visit, ICD-9: 3)  Hepatic Panel prior to visit, ICD-9: 4)  Lipid Panel prior to visit, ICD-9:   fasting 5)  CBC w/ Diff prior to visit, ICD-9:  and anemia panel 6)  HbgA1C prior to visit, ICD-9: 7)  Vitamin D 8)  It is important that you exercise regularly at least 20 minutes 5 times a week. If you develop chest pain, have severe difficulty breathing, or feel very tired , stop exercising immediately and seek medical attention. 9)  You need to lose weight. Consider a lower calorie diet and regular exercise.  10)  Check your blood sugars regularly. If your readings are usually above : or below 70 you should contact our office. Prescriptions: HYDROXYZINE HCL 25 MG/ML SOLN (HYDROXYZINE HCL) Take 1 tablet by mouth three times a day as needed  #90 x 1   Entered and Authorized by:   Tula Nakayama MD   Signed by:   Tula Nakayama MD on 07/24/2009   Method used:   Printed then faxed to ...       [address] [phone]* (retail)       [address]       [place], [place]  [address]       Ph: [phone] or [phone]       Fax: [phone]   RxID:   [id number] HYDROXYCHLOROQUINE SULFATE 200 MG TABS  (HYDROXYCHLOROQUINE SULFATE) Take 1 tablet by mouth two  times a day  #60 x 0   Entered and Authorized by:   Tula Nakayama MD   Signed by:   Tula Nakayama MD on 07/24/2009   Method used:   Printed then faxed to ...       [address] [phone]* (retail)       [address]       [place], [place]  [address]       Ph: [phone] or [phone]       Fax: [phone]   RxID:   [id number] POTASSIUM 100 MG TABS (LOSARTAN POTASSIUM) Take 1 tablet by mouth once a day  #30 x 3   Entered and Authorized by:   Tula Nakayama MD   Signed by:   Tula Nakayama MD on 07/24/2009   Method used:   Printed then faxed to ...       [address] [phone]* (retail)       [address]       [place], [place]  [address]       Ph: [phone] or [phone]       Fax: [phone]   RxID:   [id number]

## 2010-04-22 NOTE — Progress Notes (Signed)
Summary: nurse  Phone Note Call from Patient   Summary of Call: pt needs to get stripes called in. [facility] [phone] Initial call taken by: Lenn Cal,  October 22, 2009 2:08 PM  Follow-up for Phone Call        Rx Called In Follow-up by: Baldomero Lamy LPN,  August  2, 624THL 2:11 PM    Prescriptions: ACCU-CHEK COMPACT TEST DRUM  STRP (GLUCOSE BLOOD) two times a day testing  #102 x 0   Entered by:   Baldomero Lamy LPN   Authorized by:   Tula Nakayama MD   Signed by:   Baldomero Lamy LPN on 075-GRM   Method used:   Electronically to        [facility]. [phone]* (retail)       [address]       [place], [place]  [address]       Ph: [phone] or [phone]       Fax: [phone]   RxID:   [id number]

## 2010-04-22 NOTE — Assessment & Plan Note (Signed)
Summary: F UP   Vital Signs:  Patient profile:   55 year old female Menstrual status:  hysterectomy Height:      63 inches Weight:      204 pounds BMI:     36.27 O2 Sat:      99 % Pulse rate:   71 / minute Pulse rhythm:   regular Resp:     16 per minute BP sitting:   120 / 82  (right arm) Cuff size:   large  Vitals Entered By: Kate Sable LPN (February 16, 624THL 8:06 AM)  Nutrition Counseling: Patient's BMI is greater than 25 and therefore counseled on weight management options. [place]: Follow up chronic problems Is Patient Diabetic? Yes Pain Assessment Patient in pain? yes     Location: left arm  Intensity: 4 Onset of pain  thinks she pulled a muscle, 2 weeks   CC:  Follow up chronic problems.  History of Present Illness: Reports  thatshe is doing  well. Denies recent fever or chills. Denies sinus pressure, nasal congestion , ear pain or sore throat. Denies chest congestion, or cough productive of sputum. Denies chest pain, palpitations, PND, orthopnea or leg swelling. Denies abdominal pain, nausea, vomitting, diarrhea or constipation. Denies change in bowel movements or bloody stool. Denies dysuria , frequency, incontinence or hesitancy.  Denies headaches, vertigo, seizures. Denies depression, anxiety or insomnia. Denies  rash, lesions, or itch. she has recently joined an exercise class and plans to change her eating habits also to improve her health.     Current Medications (verified): 1)  Triamterene-Hctz 75-50 Mg  Tabs (Triamterene-Hctz) .... Take 1 Tablet By Mouth Once A Day 2)  Blood Pressure Monitor/l Cuff   Misc (Misc. Devices) .... Uad 3)  Catapres 0.2 Mg Tabs (Clonidine Hcl) .... Take 1 Tab By Mouth At Bedtime 4)  Metformin Hcl 1000 Mg Tabs (Metformin Hcl) .... One Tab By Mouth Bid 5)  Gabapentin 100 Mg Caps (Gabapentin) .... Take 1 Capsule By Mouth At Bedtime 6)  Flonase 50 Mcg/act Susp (Fluticasone Propionate) .... 2 Puffs Per Nostril Daily 7)   Accu-Check Compact Strips and Syringes .... Uad For Once Daily Testing 8)  Simcor 750-20 Mg Xr24h-Tab (Niacin-Simvastatin) .... Take 1 Tab By Mouth At Bedtime 9)  Benicar 20 Mg Tabs (Olmesartan Medoxomil) .... Take 1 Tablet By Mouth Once A Day 10)  Hydroxyzine Hcl 25 Mg Tabs (Hydroxyzine Hcl) .... Take 1 Tablet By Mouth Once A Day 11)  Losartan Potassium 50 Mg Tabs (Losartan Potassium) .... Take 1 Tablet By Mouth Once A Day 12)  Lovastatin 40 Mg Tabs (Lovastatin) .... Take 1 Tablet By Mouth Once A Day  Allergies (verified): 1)  ! Pcn 2)  ! Ace Inhibitors  Review of Systems      See HPI Eyes:  Denies blurring and discharge. MS:  Complains of joint pain and stiffness; left elbow pain and left hip pain , with direct pressure and movement following zumba classes. Endo:  Denies cold intolerance, excessive hunger, excessive thirst, excessive urination, heat intolerance, polyuria, and weight change. Heme:  Denies abnormal bruising and bleeding. Allergy:  Complains of seasonal allergies; denies hives or rash.  Physical Exam  General:  Well-developed,obese,in no acute distress; alert,appropriate and cooperative throughout examination HEENT: No facial asymmetry,  EOMI, No sinus tenderness, TM's Clear, oropharynx  pink and moist.   Chest: Clear to auscultation bilaterally.  CVS: S1, S2, No murmurs, No S3.   Abd: Soft, Nontender.  MS: Adequate ROM spine,reduced in  left hip, adequate in  shoulders and knees. Left elbow tender with reduced ROM Ext: No edema.   CNS: CN 2-12 intact, power tone and sensation normal throughout.   Skin: Intact, no visible lesions or rashes.  Psych: Good eye contact, normal affect.  Memory intact, not anxious or depressed appearing.    Impression & Recommendations:  Problem # 1:  ELBOW PAIN, LEFT [id number]) Assessment Comment Only  Orders: Ketorolac-Toradol 15mg  [id number]) Admin of Therapeutic Inj  intramuscular or subcutaneous [id number])  Problem # 2:  HIP PAIN,  LEFT (ICD-719.45) Assessment: Comment Only  Her updated medication list for this problem includes:    Ibuprofen 800 Mg Tabs (Ibuprofen) .Marland Kitchen... Take 1 tablet by mouth three times a day  Problem # 3:  OBESITY (ICD-278.00) Assessment: Improved  Ht: 63 (05/08/2009)   Wt: 204 (05/08/2009)   BMI: 36.27 (05/08/2009)  Problem # 4:  HYPERTENSION (ICD-401.9) Assessment: Improved  Her updated medication list for this problem includes:    Triamterene-hctz 75-50 Mg Tabs (Triamterene-hctz) .Marland Kitchen... Take 1 tablet by mouth once a day    Catapres 0.2 Mg Tabs (Clonidine hcl) .Marland Kitchen... Take 1 tab by mouth at bedtime    Benicar 20 Mg Tabs (Olmesartan medoxomil) .Marland Kitchen... Take 1 tablet by mouth once a day    Losartan Potassium 50 Mg Tabs (Losartan potassium) .Marland Kitchen... Take 1 tablet by mouth once a day  Orders: T-Basic Metabolic Panel (99991111)  BP today: 120/82 Prior BP: 154/84 (01/28/2009)  Labs Reviewed: K+: 4.3 (05/07/2009) Creat: : 0.87 (05/07/2009)   Chol: 208 (05/07/2009)   HDL: 41 (05/07/2009)   LDL: 131 (05/07/2009)   TG: 178 (05/07/2009)  Problem # 5:  HYPERLIPIDEMIA (ICD-272.4) Assessment: Unchanged  The following medications were removed from the medication list:    Simcor 750-20 Mg Xr24h-tab (Niacin-simvastatin) .Marland Kitchen... Take 1 tab by mouth at bedtime Her updated medication list for this problem includes:    Simcor 750-20 Mg Xr24h-tab (Niacin-simvastatin) .Marland Kitchen... Take 1 tab by mouth at bedtime  Orders: T-Hepatic Function [phone]) T-Lipid Profile [phone])  Labs Reviewed: SGOT: 38 (05/07/2009)   SGPT: 36 (05/07/2009)   HDL:41 (05/07/2009), 37 (01/26/2009)  LDL:131 (05/07/2009), 123 (01/26/2009)  Chol:208 (05/07/2009), 203 (01/26/2009)  Trig:178 (05/07/2009), 216 (01/26/2009) pt has not started her new med simcor yet  Problem # 6:  DIABETES MELLITUS, TYPE II (ICD-250.00) Assessment: Improved  Her updated medication list for this problem includes:    Metformin Hcl 1000 Mg Tabs  (Metformin hcl) ..... One tab by mouth bid    Benicar 20 Mg Tabs (Olmesartan medoxomil) .Marland Kitchen... Take 1 tablet by mouth once a day    Losartan Potassium 50 Mg Tabs (Losartan potassium) .Marland Kitchen... Take 1 tablet by mouth once a day  Orders: Glucose, (CBG) (82962) Hemoglobin A1C (83036)  Labs Reviewed: Creat: 0.87 (05/07/2009)    Reviewed HgBA1c results: 7.2 (05/08/2009)  7.5 (01/28/2009)  Complete Medication List: 1)  Triamterene-hctz 75-50 Mg Tabs (Triamterene-hctz) .... Take 1 tablet by mouth once a day 2)  Blood Pressure Monitor/l Cuff Misc (Misc. devices) .... Uad 3)  Catapres 0.2 Mg Tabs (Clonidine hcl) .... Take 1 tab by mouth at bedtime 4)  Metformin Hcl 1000 Mg Tabs (Metformin hcl) .... One tab by mouth bid 5)  Flonase 50 Mcg/act Susp (Fluticasone propionate) .... 2 puffs per nostril daily 6)  Accu-check Compact Strips and Syringes  .... Uad for once daily testing 7)  Simcor 750-20 Mg Xr24h-tab (Niacin-simvastatin) .... Take 1 tab by mouth at bedtime 8)  Benicar 20 Mg Tabs (  Olmesartan medoxomil) .... Take 1 tablet by mouth once a day 9)  Hydroxyzine Hcl 25 Mg Tabs (Hydroxyzine hcl) .... Take 1 tablet by mouth once a day 10)  Losartan Potassium 50 Mg Tabs (Losartan potassium) .... Take 1 tablet by mouth once a day 11)  Ibuprofen 800 Mg Tabs (Ibuprofen) .... Take 1 tablet by mouth three times a day  Patient Instructions: 1)  Please schedule a follow-up appointment in 3.5 months. 2)  You are being treated for ARTHRITIS IN  Tthe hip and elbow and tendonitis. 3)  Stop the gabapentin 4)    5)  Hepatic Panel prior to visit, ICD-9:   in 6 weeks, dx elevated LFT's 6)  cBC   dx fatigue 7)  It is important that you exercise regularly at least 20 minutes 5 times a week. If you develop chest pain, have severe difficulty breathing, or feel very tired , stop exercising immediately and seek medical attention. 8)  You need to lose weight. Consider a lower calorie diet and regular exercise.  9)  Check  your blood sugars regularly. If your readings are usually above :200 or below 70 you should contact our office. 10)  BMP prior to visit, ICD-9: 11)  Hepatic Panel prior to visit, ICD-9:  fasting in 3.5 months 12)  Lipid Panel prior to visit, ICD-9: Prescriptions: METFORMIN HCL 1000 MG TABS (METFORMIN HCL) ONE TAB by mouth BID  #180 Tablet x 3   Entered by:   Kate Sable LPN   Authorized by:   Tula Nakayama MD   Signed by:   Kate Sable LPN on 075-GRM   Method used:   Electronically to        [facility]. [phone]* (retail)       [address]       [place], [place]  [address]       Ph: [phone] or [phone]       Fax: [phone]   [id number] CATAPRES 0.2 MG TABS (CLONIDINE HCL) Take 1 tab by mouth at bedtime  #90 x 3   Entered by:   Kate Sable LPN   Authorized by:   Tula Nakayama MD   Signed by:   Kate Sable LPN on 075-GRM   Method used:   Electronically to        [facility]. [phone]* (retail)       [address]       [place], [place]  [address]       Ph: [phone] or [phone]       Fax: [phone]   [id number] IBUPROFEN 800 MG TABS (IBUPROFEN) Take 1 tablet by mouth three times a day  #30 x 0   Entered and Authorized by:   Tula Nakayama MD   Signed by:   Tula Nakayama MD on 05/08/2009   Method used:   Electronically to        [facility]. [phone]* (retail)       [address]       [place], [place]  [address]       Ph: [phone] or [phone]       Fax: [phone]   RxID:   [id number]    Medication Administration  Injection # 1:    Medication: Ketorolac-Toradol 15mg     Diagnosis: ELBOW PAIN, LEFT (ICD-719.42)  Route: IM    Site: LUOQ gluteus    Exp Date: 10/2010    Lot #: [id number]    Mfr: novaplus    Comments: 60mg  given     Patient tolerated injection without complications    Given by: Kate Sable LPN (February 16, 624THL 8:53 AM)  Orders  Added: 1)  Est. Patient Level IV [99214] 2)  T-Hepatic Function [80076-22960] 3)  T-Basic Metabolic Panel 0000000 4)  T-Hepatic Function [80076-22960] 5)  T-Lipid Profile [80061-22930] 6)  Ketorolac-Toradol 15mg  [J1885] 7)  Admin of Therapeutic Inj  intramuscular or subcutaneous [96372] 8)  Glucose, (CBG) [82962] 9)  Hemoglobin A1C [83036]   Laboratory Results   Blood Tests   Date/Time Received: May 08, 2009  Date/Time Reported: May 08, 2009   Glucose (fasting): 120 mg/dL   (Normal Range: 70-105) HGBA1C: 7.2%   (Normal Range: Non-Diabetic - 3-6%   Control Diabetic - 6-8%)

## 2010-04-22 NOTE — Medication Information (Signed)
Summary: Visual merchandiser   Imported By: Dierdre Harness 04/16/2009 09:32:22  _____________________________________________________________________  External Attachment:    Type:   Image     Comment:   External Document

## 2010-04-22 NOTE — Progress Notes (Signed)
  Phone Note Call from Patient   Caller: Patient Summary of Call: paitent states losartan is on back order at pharmacy, wants to know what to do, they dont know when they will have it again and been out one week [phone] Initial call taken by: Baldomero Lamy LPN,  July 25, 624THL QA348G AM  Follow-up for Phone Call        pls provide diovan 320mg  samples one daily x 8 weeks, she does not take this with losartan, she should have ov in the next 5 to 6 weeks Follow-up by: Tula Nakayama MD,  October 14, 2009 12:04 PM  Additional Follow-up for Phone Call Additional follow up Details #1::        called patient, left message Additional Follow-up by: Baldomero Lamy LPN,  July 25, 624THL 579FGE PM    Additional Follow-up for Phone Call Additional follow up Details #2::    patient aware Follow-up by: Baldomero Lamy LPN,  July 25, 624THL 579FGE PM  New/Updated Medications: DIOVAN 320 MG TABS (VALSARTAN) Take 1 tablet by mouth once a day Prescriptions: DIOVAN 320 MG TABS (VALSARTAN) Take 1 tablet by mouth once a day  #56 x 0   Entered and Authorized by:   Tula Nakayama MD   Signed by:   Tula Nakayama MD on 10/14/2009   Method used:   Samples Given   RxID:   [id number]

## 2010-04-22 NOTE — Miscellaneous (Signed)
Summary: med change  Clinical Lists Changes  Medications: Removed medication of SIMCOR 750-20 MG XR24H-TAB (NIACIN-SIMVASTATIN) Take 1 tab by mouth at bedtime Added new medication of SIMCOR 500-40 MG XR24H-TAB (NIACIN-SIMVASTATIN) one tab by mouth qhs

## 2010-04-22 NOTE — Letter (Signed)
Summary: Letter  Letter   Imported By: Dierdre Harness 07/25/2009 10:32:49  _____________________________________________________________________  External Attachment:    Type:   Image     Comment:   External Document

## 2010-04-22 NOTE — Assessment & Plan Note (Signed)
Summary: office visit   Vital Signs:  Patient profile:   55 year old female Menstrual status:  hysterectomy Height:      63 inches Weight:      203.75 pounds BMI:     36.22 O2 Sat:      98 % Pulse rate:   69 / minute Pulse rhythm:   regular Resp:     16 per minute BP sitting:   120 / 70  (right arm)  Vitals Entered By: Kate Sable LPN (September  7, 624THL 3:39 PM)  Nutrition Counseling: Patient's BMI is greater than 25 and therefore counseled on weight management options. CC: Follow u chronic problems    CC:  Follow u chronic problems .  History of Present Illness: Reports  that she has not been doing well, she has been experiencing increased symptoms of depression in the past 1 month. Denies recent fever or chills. Denies sinus pressure, nasal congestion , ear pain or sore throat. Denies chest congestion, or cough productive of sputum. Denies chest pain, palpitations, PND, orthopnea or leg swelling. Denies abdominal pain, nausea, vomitting, diarrhea or constipation. Denies change in bowel movements or bloody stool. Denies dysuria , frequency, incontinence or hesitancy. Denies  joint pain, swelling, or reduced mobility. Denies headaches, vertigo, seizures. Denies depression, anxiety or insomnia.    Current Medications (verified): 1)  Triamterene-Hctz 75-50 Mg  Tabs (Triamterene-Hctz) .... Take 1 Tablet By Mouth Once A Day 2)  Blood Pressure Monitor/l Cuff   Misc (Misc. Devices) .... Uad 3)  Catapres 0.2 Mg Tabs (Clonidine Hcl) .... Take 1 Tab By Mouth At Bedtime 4)  Metformin Hcl 1000 Mg Tabs (Metformin Hcl) .... One Tab By Mouth Bid 5)  Flonase 50 Mcg/act Susp (Fluticasone Propionate) .... 2 Puffs Per Nostril Daily 6)  Accu-Check Compact Strips and Syringes .... Uad For Once Daily Testing 7)  Ibuprofen 800 Mg Tabs (Ibuprofen) .... Take 1 Tablet By Mouth Three Times A Day 8)  Hydroxyzine Hcl 25 Mg/ml Soln (Hydroxyzine Hcl) .... Take 1 Tablet By Mouth Three Times A Day As  Needed 9)  Simcor 500-40 Mg Xr24h-Tab (Niacin-Simvastatin) .... One Tab By Mouth Qhs 10)  Accu-Chek Compact Test Drum  Strp (Glucose Blood) .... Two Times A Day Testing 11)  Diovan 320 Mg Tabs (Valsartan) .... Take 1 Tablet By Mouth Once A Day  Allergies (verified): 1)  ! Pcn 2)  ! Ace Inhibitors  Review of Systems      See HPI General:  Complains of fatigue. Eyes:  Denies discharge, eye pain, and red eye. Derm:  Complains of changes in color of skin, itching, and rash. Psych:  Complains of anxiety and depression; denies suicidal thoughts/plans, thoughts of violence, and unusual visions or sounds; 1 month h/o increased symptoms of depression, overwhelmed, withdrawn, no specific trigger. Endo:  Complains of excessive thirst; denies excessive urination; reports uncontroleld and widely fluctuating blood sugars sometimes over 250 in recent times. Heme:  Denies abnormal bruising and bleeding. Allergy:  Complains of seasonal allergies.  Physical Exam  General:  Well-developed,obese,in no acute distress; alert,appropriate and cooperative throughout examination HEENT: No facial asymmetry,  EOMI, No sinus tenderness, TM's Clear, oropharynx  pink and moist. Swelling of the face, with periorbital edema  Chest: Clear to auscultation bilaterally.  CVS: S1, S2, No murmurs, No S3.   Abd: Soft, Nontender.  MS: Adequate ROM spine,reduced in left hip, adequate in  shoulders and knees. Left elbow tender with reduced ROM Ext: No edema.   CNS:  CN 2-12 intact, power tone and sensation normal throughout.   Skin: Intact, erythematous macular rash on upper extremeties, and neck Psych: Good eye contact, normal affect.  Memory intact, not anxious or depressed appearing.    Impression & Recommendations:  Problem # 1:  HYPERTENSION (ICD-401.9) Assessment Improved  Her updated medication list for this problem includes:    Triamterene-hctz 75-50 Mg Tabs (Triamterene-hctz) .Marland Kitchen... Take 1 tablet by mouth once  a day    Catapres 0.2 Mg Tabs (Clonidine hcl) .Marland Kitchen... Take 1 tab by mouth at bedtime    Diovan 320 Mg Tabs (Valsartan) .Marland Kitchen... Take 1 tablet by mouth once a day  BP today: 120/70 Prior BP: 146/82 (07/24/2009)  Labs Reviewed: K+: 4.3 (05/07/2009) Creat: : 0.87 (05/07/2009)   Chol: 208 (05/07/2009)   HDL: 41 (05/07/2009)   LDL: 131 (05/07/2009)   TG: 178 (05/07/2009)  Problem # 2:  LUPUS ERYTHEMATOSUS, DISCOID (ICD-695.4) Assessment: Comment Only  The following medications were removed from the medication list:    Hydroxychloroquine Sulfate 200 Mg Tabs (Hydroxychloroquine sulfate) .Marland Kitchen... Take 1 tablet by mouth two times a day Her updated medication list for this problem includes:    Ibuprofen 800 Mg Tabs (Ibuprofen) .Marland Kitchen... Take 1 tablet by mouth three times a day pt frustrated by her intolerance to sunlight, depressed, unable to be outside, is being referred back to [facility] by local dermatologist  Problem # 3:  OBESITY (ICD-278.00) Assessment: Deteriorated  Ht: 63 (11/27/2009)   Wt: 203.75 (11/27/2009)   BMI: 36.22 (11/27/2009) therapeutic lifestyle change discussed and encouraged  Problem # 4:  HYPERLIPIDEMIA (ICD-272.4) Assessment: Comment Only  Her updated medication list for this problem includes:    Simcor 500-40 Mg Xr24h-tab (Niacin-simvastatin) ..... One tab by mouth at bedtime Low fat dietdiscussed and encouraged   Labs Reviewed: SGOT: 38 (07/22/2009)   SGPT: 34 (07/22/2009)   HDL:41 (05/07/2009), 37 (01/26/2009)  LDL:131 (05/07/2009), 123 (01/26/2009)  Chol:208 (05/07/2009), 203 (01/26/2009)  Trig:178 (05/07/2009), 216 (01/26/2009)  Problem # 5:  DIABETES MELLITUS, TYPE II (ICD-250.00) Assessment: Deteriorated  Her updated medication list for this problem includes:    Metformin Hcl 1000 Mg Tabs (Metformin hcl) ..... One tab by mouth bid    Diovan 320 Mg Tabs (Valsartan) .Marland Kitchen... Take 1 tablet by mouth once a day    Glipizide 5 Mg Xr24h-tab (Glipizide) .Marland Kitchen... Take 1  tablet by mouth once a day Patient advised to reduce carbs and sweets, commit to regular physical activity, take meds as prescribed, test blood sugars as directed, and attempt to lose weight , to improve blood sugar control.  Labs Reviewed: Creat: 0.87 (05/07/2009)    Reviewed HgBA1c results: 7.2 (05/08/2009)  7.5 (01/28/2009)  Complete Medication List: 1)  Triamterene-hctz 75-50 Mg Tabs (Triamterene-hctz) .... Take 1 tablet by mouth once a day 2)  Blood Pressure Monitor/l Cuff Misc (Misc. devices) .... Uad 3)  Catapres 0.2 Mg Tabs (Clonidine hcl) .... Take 1 tab by mouth at bedtime 4)  Metformin Hcl 1000 Mg Tabs (Metformin hcl) .... One tab by mouth bid 5)  Flonase 50 Mcg/act Susp (Fluticasone propionate) .... 2 puffs per nostril daily 6)  Accu-check Compact Strips and Syringes  .... Uad for once daily testing 7)  Ibuprofen 800 Mg Tabs (Ibuprofen) .... Take 1 tablet by mouth three times a day 8)  Hydroxyzine Hcl 25 Mg/ml Soln (Hydroxyzine hcl) .... Take 1 tablet by mouth three times a day as needed 9)  Simcor 500-40 Mg Xr24h-tab (Niacin-simvastatin) .... One tab by  mouth qhs 10)  Accu-chek Compact Test Drum Strp (Glucose blood) .... Two times a day testing 11)  Diovan 320 Mg Tabs (Valsartan) .... Take 1 tablet by mouth once a day 12)  Fluoxetine Hcl 10 Mg Caps (Fluoxetine hcl) .... Take 1 capsule by mouth once a day 13)  Glipizide 5 Mg Xr24h-tab (Glipizide) .... Take 1 tablet by mouth once a day  Patient Instructions: 1)  CPE November 8 or after 2)  Fasting labs as before 3)  new med for depression. Prescriptions: GLIPIZIDE 5 MG XR24H-TAB (GLIPIZIDE) Take 1 tablet by mouth once a day  #30 x 3   Entered and Authorized by:   Tula Nakayama MD   Signed by:   Tula Nakayama MD on 12/07/2009   Method used:   Historical   [id number] FLUOXETINE HCL 10 MG CAPS (FLUOXETINE HCL) Take 1 capsule by mouth once a day  #30 x 2   Entered and Authorized by:   Tula Nakayama MD    Signed by:   Tula Nakayama MD on 11/27/2009   Method used:   Electronically to        [facility]. [phone]* (retail)       [address]       [place], [place]  [address]       Ph: [phone] or [phone]       Fax: [phone]   [id number] SIMCOR 500-40 MG XR24H-TAB (NIACIN-SIMVASTATIN) one tab by mouth qhs  #13 boxes x 0   Entered by:   Kate Sable LPN   Authorized by:   Tula Nakayama MD   Signed by:   Tula Nakayama MD on 11/27/2009   Method used:   Samples Given   RxID:   [id number]

## 2010-04-22 NOTE — Letter (Signed)
Summary: MISC  MISC   Imported By: Dierdre Harness 09/12/2009 11:40:25  _____________________________________________________________________  External Attachment:    Type:   Image     Comment:   External Document

## 2010-04-22 NOTE — Letter (Signed)
Summary: INS INFORMATION PAPERS  INS INFORMATION PAPERS   Imported By: Dierdre Harness 03/28/2009 09:03:41  _____________________________________________________________________  External Attachment:    Type:   Image     Comment:   External Document

## 2010-04-22 NOTE — Letter (Signed)
Summary: CONSULTS  CONSULTS   Imported By: Dierdre Harness 09/12/2009 11:31:51  _____________________________________________________________________  External Attachment:    Type:   Image     Comment:   External Document

## 2010-04-22 NOTE — Letter (Signed)
Summary: LABS  LABS   Imported By: Dierdre Harness 09/12/2009 11:38:50  _____________________________________________________________________  External Attachment:    Type:   Image     Comment:   External Document

## 2010-04-22 NOTE — Letter (Signed)
Summary: MEDICAL RELEASE  MEDICAL RELEASE   Imported By: Dierdre Harness 12/06/2009 09:16:36  _____________________________________________________________________  External Attachment:    Type:   Image     Comment:   External Document

## 2010-04-22 NOTE — Letter (Signed)
Summary: DEMO  DEMO   Imported By: Dierdre Harness 09/12/2009 11:32:26  _____________________________________________________________________  External Attachment:    Type:   Image     Comment:   External Document

## 2010-04-22 NOTE — Letter (Signed)
Summary: 1st Missed Appt.  [facility]  [address]   [place], [place] [address]   Phone: [phone]  Fax: [phone]    May 02, 2009  MRN: [id number]  [employer] [place], [place]  [address]  Dear Ms. Maranan,  At [facility], we make every attempt to fit patients into our schedule by reserving several appointment slots for same-day appointments.  However, we cannot always make appointments for patients the same day they are calling.  At the end of the day, we look back at our schedule and find that because of last-minute cancellations and patients not showing up for their scheduled appointments, we have several appointment slots that are left open and could have been used by another person who really needed it.  In the past, you may have been one of the patients who could not get in when you needed to.  But recently, you were one of the patients with an appointment that you didn't show up for or canceled too late for [place] to fill it.  We choose not to charge no-show or last minute cancellation fees to our patients, like many other offices do.  We do not wish to institute that policy and hope we never have to.  However, we kindly request that you assist [place] by providing at least 24 hours' notice if you can't make your appointment.  If no-shows or late cancellations become habitual (i.e. Three or more in a one-year period), we may terminate the physician-patient relationship.    Thank you for your consideration and cooperation.   Meyer Cory

## 2010-04-22 NOTE — Progress Notes (Signed)
Summary: samples  Phone Note Call from Patient   Summary of Call: would like to get samples of benicar [phone] Initial call taken by: Lenn Cal,  July 18, 2009 2:05 PM  Follow-up for Phone Call        called pt, no answer. NO samples at this time Follow-up by: Kate Sable LPN,  April 28, 624THL 4:14 PM  Additional Follow-up for Phone Call Additional follow up Details #1::        Patient aware Additional Follow-up by: Kate Sable LPN,  April 28, 624THL 4:16 PM

## 2010-04-22 NOTE — Letter (Signed)
Summary: Letter  Letter   Imported By: Dierdre Harness 12/10/2009 11:21:23  _____________________________________________________________________  External Attachment:    Type:   Image     Comment:   External Document

## 2010-04-22 NOTE — Letter (Signed)
Summary: X RAYS  X RAYS   Imported By: Dierdre Harness 09/12/2009 11:42:59  _____________________________________________________________________  External Attachment:    Type:   Image     Comment:   External Document

## 2010-04-23 ENCOUNTER — Other Ambulatory Visit: Payer: Self-pay | Admitting: Family Medicine

## 2010-04-23 DIAGNOSIS — M549 Dorsalgia, unspecified: Secondary | ICD-10-CM

## 2010-04-24 NOTE — Progress Notes (Signed)
  Phone Note Call from Patient   Summary of Call: Patient recieved oxycodone at the ER and was wanting to know if she could have refill or something similar for her leg pain. [facility] [place] Initial call taken by: Kate Sable LPN,  January 27, [id number] 10:26 AM    New/Updated Medications: VICODIN 5-500 MG TABS (HYDROCODONE-ACETAMINOPHEN) Take 1 tablet by mouth two times a day as needed Prescriptions: VICODIN 5-500 MG TABS (HYDROCODONE-ACETAMINOPHEN) Take 1 tablet by mouth two times a day as needed  #40 x 0   Entered by:   Kate Sable LPN   Authorized by:   Tula Nakayama MD   Signed by:   Kate Sable LPN on [id number]   Method used:   Handwritten   [id number]

## 2010-04-24 NOTE — Assessment & Plan Note (Signed)
Summary: CONGESTION, COUGHING   Vital Signs:  Patient profile:   55 year old female Menstrual status:  hysterectomy Height:      63 inches Weight:      204 pounds BMI:     36.27 O2 Sat:      98 % on Room air Pulse rate:   105 / minute Pulse rhythm:   regular Resp:     16 per minute BP sitting:   160 / 90  (right arm)  Vitals Entered By: Baldomero Lamy LPN (December 12, 624THL 11:38 AM)  Nutrition Counseling: Patient's BMI is greater than 25 and therefore counseled on weight management options.  O2 Flow:  Room air CC: cough, leg pain, heel pain Is Patient Diabetic? Yes Did you bring your meter with you today? No Comments did not bring meds but states list is accurate   CC:  cough, leg pain, and heel pain.  History of Present Illness: head and chest congestion x 2 weeks, drainage now clear, however right maxillary swelling and tenderness with infected upper tooth. Sharp left thigh pains as m,uch as a 10 with no specific aggravating or relieving factors. P also c/o left heel pain.   Allergies (verified): 1)  ! Pcn 2)  ! Ace Inhibitors  Review of Systems      See HPI General:  Complains of chills and fatigue. Eyes:  Denies discharge, eye pain, and red eye. ENT:  Complains of nasal congestion, postnasal drainage, and sinus pressure; denies nosebleeds and sore throat. CV:  Denies chest pain or discomfort, palpitations, and swelling of hands. Resp:  Complains of cough, shortness of breath, sputum productive, and wheezing. GI:  Denies abdominal pain, constipation, diarrhea, nausea, and vomiting. GU:  Denies dysuria and urinary frequency. MS:  Complains of joint pain; left thigh pain and inc right heel pain worse in the past 1 week. Derm:  Complains of rash; denies itching and lesion(s); malar rash. Psych:  Denies anxiety and depression; controlled on meds. Endo:  Denies excessive thirst and excessive urination; blood sugars reportedly still higher than they should be, does not  have meter or log. Heme:  Denies abnormal bruising and bleeding. Allergy:  Complains of seasonal allergies.  Physical Exam  General:  Well-developed,obese,in no acute distress; alert,appropriate and cooperative throughout examination HEENT: No facial asymmetry,  EOMI, No sinus tenderness, TM's Clear, oropharynx  pink and moist. decreased air entry bilateral crckles, no wheezesClear to auscultation bilaterally.  CVS: S1, S2, No murmurs, No S3.   Abd: Soft, Nontender.  MS: Adequate ROM spine, shoulders and knees. decreased right hip mobility, tender left heel Ext: No edema.   CNS: CN 2-12 intact, power tone and sensation normal throughout.   Skin: Intact, no visible lesions or rashes.  Psych: Good eye contact, normal affect.  Memory intact, not anxious or depressed appearing.    Impression & Recommendations:  Problem # 1:  ACUTE BRONCHITIS (ICD-466.0) Assessment Comment Only  Her updated medication list for this problem includes:    Penicillin V Potassium 500 Mg Tabs (Penicillin v potassium) .Marland Kitchen... Take 1 tablet by mouth three times a day    Tessalon Perles 100 Mg Caps (Benzonatate) .Marland Kitchen... Take 1 capsule by mouth three times a day  Orders: Rocephin  250mg  [id number])  Problem # 2:  HEEL PAIN (ICD-729.5) Assessment: Deteriorated  Orders: Depo- Medrol 80mg  (J1040) Ketorolac-Toradol 15mg  [id number])  Problem # 3:  HYPERTENSION (ICD-401.9) Assessment: Deteriorated  Her updated medication list for this problem includes:    Triamterene-hctz  75-50 Mg Tabs (Triamterene-hctz) .Marland Kitchen... Take 1 tablet by mouth once a day    Catapres 0.2 Mg Tabs (Clonidine hcl) .Marland Kitchen... Take 1 tab by mouth at bedtime    Diovan 320 Mg Tabs (Valsartan) .Marland Kitchen... Take 1 tablet by mouth once a day  Orders: T-Basic Metabolic Panel (99991111)  BP today: 160/90 Prior BP: 120/70 (11/27/2009)  Labs Reviewed: K+: 4.1 (11/28/2009) Creat: : 1.26 (11/28/2009)   Chol: 180 (11/28/2009)   HDL: 38 (11/28/2009)   LDL: 102  (11/28/2009)   TG: 198 (11/28/2009)  Problem # 4:  DIABETES MELLITUS, TYPE II (ICD-250.00) Assessment: Comment Only  Her updated medication list for this problem includes:    Metformin Hcl 1000 Mg Tabs (Metformin hcl) ..... One tab by mouth bid    Diovan 320 Mg Tabs (Valsartan) .Marland Kitchen... Take 1 tablet by mouth once a day    Glipizide 5 Mg Xr24h-tab (Glipizide) .Marland Kitchen... Take 1 tablet by mouth once a day  Orders: T- Hemoglobin A1C [id number]) T-Urine Microalbumin w/creat. ratio [phone]) Patient advised to reduce carbs and sweets, commit to regular physical activity, take meds as prescribed, test blood sugars as directed, and attempt to lose weight , to improve blood sugar control. currently involved in study Labs Reviewed: Creat: 1.26 (11/28/2009)    Reviewed HgBA1c results: 8.3 (11/28/2009)  7.2 (05/08/2009)  Problem # 5:  HYPERLIPIDEMIA (ICD-272.4) Assessment: Comment Only  Her updated medication list for this problem includes:    Simcor 500-40 Mg Xr24h-tab (Niacin-simvastatin) ..... One tab by mouth qhs  Orders: T-Lipid Profile [id number]) T-Hepatic Function [phone]) Low fat dietdiscussed and encouraged  Labs Reviewed: SGOT: 29 (11/28/2009)   SGPT: 30 (11/28/2009)   HDL:38 (11/28/2009), 41 (05/07/2009)  LDL:102 (11/28/2009), 131 (05/07/2009)  Chol:180 (11/28/2009), 208 (05/07/2009)  Trig:198 (11/28/2009), 178 (05/07/2009)  Complete Medication List: 1)  Triamterene-hctz 75-50 Mg Tabs (Triamterene-hctz) .... Take 1 tablet by mouth once a day 2)  Blood Pressure Monitor/l Cuff Misc (Misc. devices) .... Uad 3)  Catapres 0.2 Mg Tabs (Clonidine hcl) .... Take 1 tab by mouth at bedtime 4)  Metformin Hcl 1000 Mg Tabs (Metformin hcl) .... One tab by mouth bid 5)  Flonase 50 Mcg/act Susp (Fluticasone propionate) .... 2 puffs per nostril daily 6)  Accu-check Compact Strips and Syringes  .... Uad for once daily testing 7)  Ibuprofen 800 Mg Tabs (Ibuprofen) .... Take 1 tablet  by mouth three times a day 8)  Hydroxyzine Hcl 25 Mg/ml Soln (Hydroxyzine hcl) .... Take 1 tablet by mouth three times a day as needed 9)  Simcor 500-40 Mg Xr24h-tab (Niacin-simvastatin) .... One tab by mouth qhs 10)  Accu-chek Compact Test Drum Strp (Glucose blood) .... Two times a day testing 11)  Diovan 320 Mg Tabs (Valsartan) .... Take 1 tablet by mouth once a day 12)  Fluoxetine Hcl 10 Mg Caps (Fluoxetine hcl) .... Take 1 capsule by mouth once a day 13)  Glipizide 5 Mg Xr24h-tab (Glipizide) .... Take 1 tablet by mouth once a day 14)  Penicillin V Potassium 500 Mg Tabs (Penicillin v potassium) .... Take 1 tablet by mouth three times a day 15)  Tessalon Perles 100 Mg Caps (Benzonatate) .... Take 1 capsule by mouth three times a day 16)  Ibuprofen 800 Mg Tabs (Ibuprofen) .... Take 1 tablet by mouth three times a day for 5 days , then as needed for pain  Patient Instructions: 1)  cPE in 4 to 6 weeks 2)  injections for bronchits , also for heel pain 3)  microalb today. 4)  BMP prior to visit, ICD-9: 5)  Hepatic Panel prior to visit, ICD-9: 6)  Lipid Panel prior to visit, ICD-9:  fasting asap 7)  HbgA1C prior to visit, ICD-9: Prescriptions: IBUPROFEN 800 MG TABS (IBUPROFEN) Take 1 tablet by mouth three times a day for 5 days , then as needed for pain  #40 x 1   Entered and Authorized by:   Tula Nakayama MD   Signed by:   Tula Nakayama MD on 03/03/2010   Method used:   Electronically to        [facility] [phone]* (retail)       [address]       [place], [place]  [address]       Ph: [phone] or [phone]       Fax: [phone]   RxID:   [id number] PREDNISONE (PAK) 5 MG TABS (PREDNISONE) Use as directed  #21 x 0   Entered and Authorized by:   Tula Nakayama MD   Signed by:   Tula Nakayama MD on 03/03/2010   Method used:   Electronically to        [facility]. [phone]* (retail)       [address]       [place],  [place]  [address]       Ph: [phone] or [phone]       Fax: [phone]   RxID:   [id number] TESSALON PERLES 100 MG CAPS (BENZONATATE) Take 1 capsule by mouth three times a day  #30 x 0   Entered and Authorized by:   Tula Nakayama MD   Signed by:   Tula Nakayama MD on 03/03/2010   Method used:   Electronically to        [facility]. [phone]* (retail)       [address]       [place], [place]  [address]       Ph: [phone] or [phone]       Fax: [phone]   RxID:   [id number] PENICILLIN V POTASSIUM 500 MG TABS (PENICILLIN V POTASSIUM) Take 1 tablet by mouth three times a day  #30 x 0   Entered and Authorized by:   Tula Nakayama MD   Signed by:   Tula Nakayama MD on 03/03/2010   Method used:   Electronically to        [facility]. [phone]* (retail)       [address]       [place], [place]  [address]       Ph: [phone] or [phone]       Fax: [phone]   RxID:   [id number]    Medication Administration  Injection # 1:    Medication: Depo- Medrol 80mg     Diagnosis: HEEL PAIN (ICD-729.5)    Route: IM    Site: RUOQ gluteus    Exp Date: 07/12    Lot #: Marily Lente    Mfr: Pharmacia    Patient tolerated injection without complications    Given by: Baldomero Lamy LPN (December 12, 624THL 4:14 PM)  Injection # 2:    Medication: Rocephin  250mg     Diagnosis: ACUTE BRONCHITIS (ICD-466.0)    Route: IM    Site: RUOQ gluteus    Exp Date: 01/14  Lot #: [id number]    Mfr: novaplus    Comments: rocephin 500mg  given    Patient tolerated injection without complications    Given by: Baldomero Lamy LPN (December 12, 624THL 4:16 PM)  Injection # 3:    Medication: Ketorolac-Toradol 15mg     Diagnosis: HEEL PAIN (ICD-729.5)    Route: IM    Site: LUOQ gluteus    Exp Date: 08/22/2011    Lot #: [id number]    Mfr: novaplus    Comments: toradol 60mg  given    Patient tolerated injection without complications    Given by: Baldomero Lamy LPN (December 12, 624THL 4:18 PM)  Orders Added: 1)  Est. Patient Level IV [99214] 2)  T-Basic Metabolic Panel 0000000 3)  T-Lipid Profile [80061-22930] 4)  T-Hepatic Function [80076-22960] 5)  T- Hemoglobin A1C [83036-23375] 6)  T-Urine Microalbumin w/creat. ratio [82043-[address]] 7)  Depo- Medrol 80mg  [J1040] 8)  Rocephin  250mg  [J0696] 9)  Ketorolac-Toradol 15mg  [J1885]     Medication Administration  Injection # 1:    Medication: Depo- Medrol 80mg     Diagnosis: HEEL PAIN (ICD-729.5)    Route: IM    Site: RUOQ gluteus    Exp Date: 07/12    Lot #: Marily Lente    Mfr: Pharmacia    Patient tolerated injection without complications    Given by: Baldomero Lamy LPN (December 12, 624THL 4:14 PM)  Injection # 2:    Medication: Rocephin  250mg     Diagnosis: ACUTE BRONCHITIS (ICD-466.0)    Route: IM    Site: RUOQ gluteus    Exp Date: 01/14    Lot #: [id number]    Mfr: novaplus    Comments: rocephin 500mg  given    Patient tolerated injection without complications    Given by: Baldomero Lamy LPN (December 12, 624THL 4:16 PM)  Injection # 3:    Medication: Ketorolac-Toradol 15mg     Diagnosis: HEEL PAIN (ICD-729.5)    Route: IM    Site: LUOQ gluteus    Exp Date: 08/22/2011    Lot #: [id number]    Mfr: novaplus    Comments: toradol 60mg  given    Patient tolerated injection without complications    Given by: Baldomero Lamy LPN (December 12, 624THL 4:18 PM)  Orders Added: 1)  Est. Patient Level IV [99214] 2)  T-Basic Metabolic Panel 0000000 3)  T-Lipid Profile [80061-22930] 4)  T-Hepatic Function [80076-22960] 5)  T- Hemoglobin A1C [83036-23375] 6)  T-Urine Microalbumin w/creat. ratio [82043-[address]] 7)  Depo- Medrol 80mg  [J1040] 8)  Rocephin  250mg  [J0696] 9)  Ketorolac-Toradol 15mg  [J1885]

## 2010-04-24 NOTE — Letter (Signed)
Summary: medical release  medical release   Imported By: Dierdre Harness 03/07/2010 14:07:09  _____________________________________________________________________  External Attachment:    Type:   Image     Comment:   External Document

## 2010-04-25 ENCOUNTER — Telehealth (INDEPENDENT_AMBULATORY_CARE_PROVIDER_SITE_OTHER): Payer: Self-pay | Admitting: *Deleted

## 2010-04-25 NOTE — Letter (Signed)
Summary: HISTORY AND PHYSICAL  HISTORY AND PHYSICAL   Imported By: Dierdre Harness 09/12/2009 11:33:43  _____________________________________________________________________  External Attachment:    Type:   Image     Comment:   External Document

## 2010-04-28 ENCOUNTER — Encounter (HOSPITAL_COMMUNITY): Payer: Self-pay

## 2010-04-28 ENCOUNTER — Ambulatory Visit (HOSPITAL_COMMUNITY)
Admission: RE | Admit: 2010-04-28 | Discharge: 2010-04-28 | Disposition: A | Discharge status: Home / Self Care | Payer: 59 | Source: Ambulatory Visit | Attending: Family Medicine | Admitting: Family Medicine

## 2010-04-28 DIAGNOSIS — M47817 Spondylosis without myelopathy or radiculopathy, lumbosacral region: Secondary | ICD-10-CM | POA: Insufficient documentation

## 2010-04-28 DIAGNOSIS — M549 Dorsalgia, unspecified: Secondary | ICD-10-CM

## 2010-04-28 DIAGNOSIS — M545 Low back pain, unspecified: Secondary | ICD-10-CM | POA: Insufficient documentation

## 2010-04-28 DIAGNOSIS — M79609 Pain in unspecified limb: Secondary | ICD-10-CM | POA: Insufficient documentation

## 2010-04-30 NOTE — Progress Notes (Signed)
Summary: precert  Phone Note Call from Patient   Summary of Call: pt had to get a precert number on [id number] Initial call taken by: Lenn Cal,  April 25, 2010 1:58 PM

## 2010-05-08 NOTE — Assessment & Plan Note (Signed)
Summary: ov   Vital Signs:  Patient profile:   55 year old female Menstrual status:  hysterectomy Height:      63 inches Weight:      205.50 pounds BMI:     36.53 O2 Sat:      97 % Pulse rate:   83 / minute Pulse rhythm:   regular Resp:     16 per minute BP sitting:   130 / 80  (left arm) Cuff size:   large  Vitals Entered By: Kate Sable LPN   Nutrition Counseling: Patient's BMI is greater than 25 and therefore counseled on weight management options. CC: Follow up chronic problems, ER follow up, left leg pain    CC:  Follow up chronic problems, ER follow up, and left leg pain .  History of Present Illness: Pt was in the ED 2 weeks ago wih lef leg and lowback pain, doing better now, and she got a muscle relaxant and a pain  pill,denies incontnence of stool or urine, does have some tingling in the leg. Reports  that she is otherwise doing fairly well. Denies recent fever or chills. Denies sinus pressure, nasal congestion , ear pain or sore throat. Denies chest congestion, or cough productive of sputum. Denies chest pain, palpitations, PND, orthopnea or leg swelling. Denies abdominal pain, nausea, vomitting, diarrhea or constipation. Denies change in bowel movements or bloody stool. Denies dysuria , frequency, incontinence or hesitancy.  Denies headaches, vertigo, seizures. Denies depression, anxiety or insomnia.Treated adequately with meds. Denies  rash, lesions, or itch.     Current Medications (verified): 1)  Triamterene-Hctz 75-50 Mg  Tabs (Triamterene-Hctz) .... Take 1 Tablet By Mouth Once A Day 2)  Blood Pressure Monitor/l Cuff   Misc (Misc. Devices) .... Uad 3)  Catapres 0.2 Mg Tabs (Clonidine Hcl) .... Take 1 Tab By Mouth At Bedtime 4)  Metformin Hcl 1000 Mg Tabs (Metformin Hcl) .... One Tab By Mouth Bid 5)  Flonase 50 Mcg/act Susp (Fluticasone Propionate) .... 2 Puffs Per Nostril Daily 6)  Accu-Check Compact Strips and Syringes .... Uad For Once Daily  Testing 7)  Hydroxyzine Hcl 25 Mg/ml Soln (Hydroxyzine Hcl) .... Take 1 Tablet By Mouth Three Times A Day As Needed 8)  Simcor 500-40 Mg Xr24h-Tab (Niacin-Simvastatin) .... One Tab By Mouth Qhs 9)  Accu-Chek Compact Test Drum  Strp (Glucose Blood) .... Two Times A Day Testing 10)  Diovan 320 Mg Tabs (Valsartan) .... Take 1 Tablet By Mouth Once A Day 11)  Fluoxetine Hcl 10 Mg Caps (Fluoxetine Hcl) .... Take 1 Capsule By Mouth Once A Day 12)  Glipizide 5 Mg Xr24h-Tab (Glipizide) .... Take 1 Tablet By Mouth Once A Day 13)  Ibuprofen 800 Mg Tabs (Ibuprofen) .... Take 1 Tablet By Mouth Three Times A Day For 5 Days , Then As Needed For Pain 14)  Methocarbamol 500 Mg Tabs (Methocarbamol) .... Take 2 Tabs Four Times Daily  Allergies (verified): 1)  ! Pcn 2)  ! Ace Inhibitors  Review of Systems      See HPI Eyes:  Denies discharge, eye pain, and red eye. MS:  Complains of low back pain, mid back pain, cramps, and muscle weakness. Endo:  Denies excessive thirst and excessive urination; blood sugars atre seldom under 180 when checked. Heme:  Denies abnormal bruising and bleeding. Allergy:  Complains of seasonal allergies.  Physical Exam  General:  Well-developed,obese,in no acute distress; alert,appropriate and cooperative throughout examination HEENT: No facial asymmetry,  EOMI, No sinus  tenderness, TM's Clear, oropharynx  pink and moist.  chest:Clear to auscultation bilaterally.  CVS: S1, S2, No murmurs, No S3.   Abd: Soft, Nontender.  MS: decreased  ROM spine,adequate in  shoulders and knees.  Ext: No edema.   CNS: CN 2-12 intact, power tone and sensation normal throughout.   Skin: Intact, no visible lesions or rashes.  Psych: Good eye contact, normal affect.  Memory intact, not anxious or depressed appearing.    Impression & Recommendations:  Problem # 1:  BACK PAIN WITH RADICULOPATHY (ICD-729.2) Assessment Deteriorated  Future Orders: Radiology Referral (Radiology) ...  04/23/2010  Problem # 2:  HYPERLIPIDEMIA (ICD-272.4) Assessment: Deteriorated  Her updated medication list for this problem includes:    Simcor 500-40 Mg Xr24h-tab (Niacin-simvastatin) ..... One tab by mouth qhs Low fat dietdiscussed and encouraged  Orders: T-Lipid Profile [id number])  Labs Reviewed: SGOT: 30 (04/01/2010)   SGPT: 37 (04/01/2010)   HDL:44 (04/01/2010), 38 (11/28/2009)  LDL:158 (04/01/2010), 102 (11/28/2009)  Chol:234 (04/01/2010), 180 (11/28/2009)  Trig:159 (04/01/2010), 198 (11/28/2009)  Problem # 3:  HYPERTENSION (ICD-401.9) Assessment: Improved  Her updated medication list for this problem includes:    Triamterene-hctz 75-50 Mg Tabs (Triamterene-hctz) .Marland Kitchen... Take 1 tablet by mouth once a day    Catapres 0.2 Mg Tabs (Clonidine hcl) .Marland Kitchen... Take 1 tab by mouth at bedtime    Diovan 320 Mg Tabs (Valsartan) .Marland Kitchen... Take 1 tablet by mouth once a day  Orders: T-CMP with estimated GFR ([id number])  BP today: 130/80 Prior BP: 160/90 (03/03/2010)  Labs Reviewed: K+: 4.8 (04/01/2010) Creat: : 1.25 (04/01/2010)   Chol: 234 (04/01/2010)   HDL: 44 (04/01/2010)   LDL: 158 (04/01/2010)   TG: 159 (04/01/2010)  Problem # 4:  DIABETES MELLITUS, TYPE II (ICD-250.00) Assessment: Deteriorated  Her updated medication list for this problem includes:    Metformin Hcl 1000 Mg Tabs (Metformin hcl) ..... One tab by mouth bid    Diovan 320 Mg Tabs (Valsartan) .Marland Kitchen... Take 1 tablet by mouth once a day    Glipizide 5 Mg Xr24h-tab (Glipizide) .Marland Kitchen... Take 1 tablet by mouth once a day Patient advised to reduce carbs and sweets, commit to regular physical activity, take meds as prescribed, test blood sugars as directed, and attempt to lose weight , to improve blood sugar control. Pt enrolled in study Orders: T- Hemoglobin A1C [id number])  Labs Reviewed: Creat: 1.25 (04/01/2010)    Reviewed HgBA1c results: 8.5 (04/01/2010)  8.3 (11/28/2009)  Problem # 5:  SLEEP APNEA  (ICD-780.57) Assessment: Deteriorated not using CPAP, needs to sched appt for fitting  Complete Medication List: 1)  Triamterene-hctz 75-50 Mg Tabs (Triamterene-hctz) .... Take 1 tablet by mouth once a day 2)  Blood Pressure Monitor/l Cuff Misc (Misc. devices) .... Uad 3)  Catapres 0.2 Mg Tabs (Clonidine hcl) .... Take 1 tab by mouth at bedtime 4)  Metformin Hcl 1000 Mg Tabs (Metformin hcl) .... One tab by mouth bid 5)  Flonase 50 Mcg/act Susp (Fluticasone propionate) .... 2 puffs per nostril daily 6)  Accu-check Compact Strips and Syringes  .... Uad for once daily testing 7)  Hydroxyzine Hcl 25 Mg/ml Soln (Hydroxyzine hcl) .... Take 1 tablet by mouth three times a day as needed 8)  Simcor 500-40 Mg Xr24h-tab (Niacin-simvastatin) .... One tab by mouth qhs 9)  Accu-chek Compact Test Drum Strp (Glucose blood) .... Two times a day testing 10)  Diovan 320 Mg Tabs (Valsartan) .... Take 1 tablet by mouth once a day 11)  Fluoxetine Hcl  10 Mg Caps (Fluoxetine hcl) .... Take 1 capsule by mouth once a day 12)  Glipizide 5 Mg Xr24h-tab (Glipizide) .... Take 1 tablet by mouth once a day 13)  Ibuprofen 800 Mg Tabs (Ibuprofen) .... Take 1 tablet by mouth three times a day for 5 days , then as needed for pain 14)  Methocarbamol 500 Mg Tabs (Methocarbamol) .... 2 tablets four times daily as needed for pain 15)  Vicodin 5-500 Mg Tabs (Hydrocodone-acetaminophen) .... Take 1 tablet by mouth two times a day as needed  Patient Instructions: 1)  CPE in 3 months 2)  Pls sched appt with Dr. Merlene Laughter so you can get your sleep apnea treated, this will improve your energy, help with diet control and weight loss, protect your heart and lungs from damage from untreated sleep apnea. 3)  Yopur cholesterol is too high , pls change your eating to low fat foods and egg white. 4)  The blood sugar meds will need to be changed/increased and you will get copy of your most recent labs 5)  I am referring you for an MRI of your  low back Prescriptions: HYDROXYZINE HCL 25 MG/ML SOLN (HYDROXYZINE HCL) Take 1 tablet by mouth three times a day as needed  #90 x 1   Entered by:   Baldomero Lamy LPN   Authorized by:   Tula Nakayama MD   Signed by:   Baldomero Lamy LPN on 579FGE   Method used:   Electronically to        [facility]. [phone]* (retail)       [address]       [place], [place]  [address]       Ph: [phone] or [phone]       Fax: [phone]   RxID:   [id number] TRIAMTERENE-HCTZ 75-50 MG  TABS (TRIAMTERENE-HCTZ) Take 1 tablet by mouth once a day  #90 Tablet x 1   Entered by:   Baldomero Lamy LPN   Authorized by:   Tula Nakayama MD   Signed by:   Baldomero Lamy LPN on 579FGE   Method used:   Electronically to        [facility]. [phone]* (retail)       [address]       [place], [place]  [address]       Ph: [phone] or [phone]       Fax: [phone]   RxIDXO:[id number] METHOCARBAMOL 500 MG TABS (METHOCARBAMOL) 2 tablets four times daily as needed for pain  #120 x 2   Entered and Authorized by:   Tula Nakayama MD   Signed by:   Tula Nakayama MD on 04/16/2010   Method used:   Electronically to        [facility]. [phone]* (retail)       [address]       [place],   [address]       Ph: [phone] or [phone]       Fax: [phone]   RxID:   [id number]    Orders Added: 1)  Est. Patient Level IV [id number] 2)  T-CMP with estimated GFR [[address]] 3)  T-Lipid Profile [80061-22930] 4)  T- Hemoglobin A1C [83036-23375] 5)  Radiology Referral [Radiology]   diovan 160mg  samples given x12 understands 2 tabs to make her dose F1024 10/12 f1041 12/12  Baldomero Lamy LPN  January 25, [id number] 3:12 PM

## 2010-06-25 ENCOUNTER — Other Ambulatory Visit: Payer: Self-pay | Admitting: Family Medicine

## 2010-06-26 LAB — CBC
HCT: 37.1 % (ref 36.0–46.0)
Hemoglobin: 11.9 g/dL — ABNORMAL LOW (ref 12.0–15.0)
MCHC: 32.1 g/dL (ref 30.0–36.0)
MCV: 70.9 fL — ABNORMAL LOW (ref 78.0–100.0)
RBC: 5.24 MIL/uL — ABNORMAL HIGH (ref 3.87–5.11)
WBC: 5.1 10*3/uL (ref 4.0–10.5)

## 2010-06-30 LAB — CREATININE, SERUM
Creatinine, Ser: 0.89 mg/dL (ref 0.4–1.2)
GFR calc Af Amer: >60 mL/min (ref >60)
GFR calc non Af Amer: >60 mL/min (ref >60)

## 2010-07-08 ENCOUNTER — Encounter: Payer: Self-pay | Admitting: Family Medicine

## 2010-07-08 LAB — CBC
Platelets: 305 10*3/uL (ref 150–400)
RBC: 4.42 MIL/uL (ref 3.87–5.11)
WBC: 6.1 10*3/uL (ref 4.0–10.5)

## 2010-07-08 LAB — DIFFERENTIAL
Basophils Absolute: 0 10*3/uL (ref 0.0–0.1)
Eosinophils Absolute: 0.1 10*3/uL (ref 0.0–0.7)
Eosinophils Relative: 2 % (ref 0–5)
Lymphocytes Relative: 49 % — ABNORMAL HIGH (ref 12–46)
Lymphs Abs: 3 10*3/uL (ref 0.7–4.0)
Monocytes Absolute: 0.5 10*3/uL (ref 0.1–1.0)

## 2010-07-08 LAB — COMPREHENSIVE METABOLIC PANEL
ALT: 34 U/L (ref 0–35)
AST: 35 U/L (ref 0–37)
Albumin: 4.3 g/dL (ref 3.5–5.2)
CO2: 28 mEq/L (ref 19–32)
Chloride: 100 mEq/L (ref 96–112)
Creatinine, Ser: 1.22 mg/dL — ABNORMAL HIGH (ref 0.4–1.2)
GFR calc Af Amer: 56 mL/min — ABNORMAL LOW (ref >60)
GFR calc non Af Amer: 46 mL/min — ABNORMAL LOW (ref >60)
Sodium: 140 mEq/L (ref 135–145)
Total Bilirubin: 0.4 mg/dL (ref 0.3–1.2)

## 2010-07-08 LAB — HEMOGLOBINOPATHY EVALUATION
Hgb A: 97.4 % (ref 96.8–97.8)
Hgb S Quant: 0 % (ref 0.0–0.0)

## 2010-07-08 LAB — ANA: Anti Nuclear Antibody(ANA): NEGATIVE

## 2010-07-08 LAB — PROTEIN ELECTROPH W RFLX QUANT IMMUNOGLOBULINS
Albumin ELP: 56.8 % (ref 55.8–66.1)
Alpha-1-Globulin: 3.4 % (ref 2.9–4.9)
Beta 2: 7 % — ABNORMAL HIGH (ref 3.2–6.5)
Beta Globulin: 6.5 % (ref 4.7–7.2)
Gamma Globulin: 16.6 % (ref 11.1–18.8)

## 2010-07-08 LAB — RETICULOCYTES
RBC.: 4.42 MIL/uL (ref 3.87–5.11)
Retic Count, Absolute: 39.8 10*3/uL (ref 19.0–186.0)
Retic Ct Pct: 0.9 % (ref 0.4–3.1)

## 2010-07-08 LAB — IMMUNOFIXATION ELECTROPHORESIS: IgM, Serum: 77 mg/dL (ref 60–263)

## 2010-07-08 LAB — SEDIMENTATION RATE: Sed Rate: 27 mm/hr — ABNORMAL HIGH (ref 0–22)

## 2010-07-09 ENCOUNTER — Other Ambulatory Visit (HOSPITAL_COMMUNITY)
Admission: RE | Admit: 2010-07-09 | Discharge: 2010-07-09 | Disposition: A | Discharge status: Home / Self Care | Payer: 59 | Source: Ambulatory Visit | Attending: Family Medicine | Admitting: Family Medicine

## 2010-07-09 ENCOUNTER — Encounter: Payer: Self-pay | Admitting: Family Medicine

## 2010-07-09 ENCOUNTER — Ambulatory Visit (INDEPENDENT_AMBULATORY_CARE_PROVIDER_SITE_OTHER): Payer: 59 | Admitting: Family Medicine

## 2010-07-09 ENCOUNTER — Other Ambulatory Visit: Payer: Self-pay | Admitting: Family Medicine

## 2010-07-09 VITALS — BP 150/100 | HR 89 | Resp 16 | Ht 66.0 in | Wt 203.8 lb

## 2010-07-09 DIAGNOSIS — Z1211 Encounter for screening for malignant neoplasm of colon: Secondary | ICD-10-CM

## 2010-07-09 DIAGNOSIS — R5381 Other malaise: Secondary | ICD-10-CM

## 2010-07-09 DIAGNOSIS — E0590 Thyrotoxicosis, unspecified without thyrotoxic crisis or storm: Secondary | ICD-10-CM

## 2010-07-09 DIAGNOSIS — Z139 Encounter for screening, unspecified: Secondary | ICD-10-CM

## 2010-07-09 DIAGNOSIS — E785 Hyperlipidemia, unspecified: Secondary | ICD-10-CM

## 2010-07-09 DIAGNOSIS — I10 Essential (primary) hypertension: Secondary | ICD-10-CM

## 2010-07-09 DIAGNOSIS — E119 Type 2 diabetes mellitus without complications: Secondary | ICD-10-CM

## 2010-07-09 DIAGNOSIS — E039 Hypothyroidism, unspecified: Secondary | ICD-10-CM

## 2010-07-09 DIAGNOSIS — Z124 Encounter for screening for malignant neoplasm of cervix: Secondary | ICD-10-CM

## 2010-07-09 DIAGNOSIS — Z01419 Encounter for gynecological examination (general) (routine) without abnormal findings: Secondary | ICD-10-CM | POA: Insufficient documentation

## 2010-07-09 DIAGNOSIS — R5383 Other fatigue: Secondary | ICD-10-CM

## 2010-07-09 LAB — POC HEMOCCULT BLD/STL (OFFICE/1-CARD/DIAGNOSTIC): Fecal Occult Blood, POC: NEGATIVE

## 2010-07-09 MED ORDER — CLONIDINE HCL 0.2 MG PO TABS: 0.2000 mg | ORAL_TABLET | Freq: Two times a day (BID) | ORAL | Status: DC

## 2010-07-09 MED ORDER — GLIPIZIDE ER 5 MG PO TB24: 5.0000 mg | ORAL_TABLET | Freq: Every day | ORAL | Status: DC

## 2010-07-09 MED ORDER — HYDROXYZINE HCL 25 MG PO TABS: 25.0000 mg | ORAL_TABLET | Freq: Three times a day (TID) | ORAL | Status: DC | PRN

## 2010-07-09 MED ORDER — TRIAMTERENE-HCTZ 75-50 MG PO TABS: 1.0000 | ORAL_TABLET | Freq: Every day | ORAL | Status: DC

## 2010-07-09 NOTE — Patient Instructions (Addendum)
F/U in 6 weeks.  Dose increase on clonidine to TWICE daily.  Antibiotic and expectorant are sent in for your cough, and you will receive claritin samples , take one daily.  It is important that you exercise regularly at least 30 minutes 5 times a week. If you develop chest pain, have severe difficulty breathing, or feel very tired, stop exercising immediately and seek medical attention  A healthy diet is rich in fruit, vegetables and whole grains. Poultry fish, nuts and beans are a healthy choice for protein rather then red meat. A low sodium diet and drinking 64 ounces of water daily is generally recommended. Oils and sweet should be limited. Carbohydrates especially for those who are diabetic or overweight, should be limited to 34-45 gram per meal. It is important to eat on a regular schedule, at least 3 times daily. Snacks should be primarily fruits, vegetables or nuts.  Mammogram is due.  Fasting chem 7 and egfr also include hepatic, HBA1C and CBC asap also tsh

## 2010-07-10 ENCOUNTER — Encounter: Payer: Self-pay | Admitting: Family Medicine

## 2010-07-10 ENCOUNTER — Telehealth: Payer: Self-pay | Admitting: *Deleted

## 2010-07-10 ENCOUNTER — Telehealth: Payer: Self-pay | Admitting: Family Medicine

## 2010-07-10 MED ORDER — VALSARTAN 320 MG PO TABS: 320.0000 mg | ORAL_TABLET | Freq: Every day | ORAL | Status: DC

## 2010-07-10 MED ORDER — NIACIN-SIMVASTATIN ER 500-40 MG PO TB24: 1.0000 | ORAL_TABLET | Freq: Every day | ORAL | Status: DC

## 2010-07-10 NOTE — Telephone Encounter (Signed)
Meds sent

## 2010-07-10 NOTE — Telephone Encounter (Signed)
Dr. Moshe Cipro is aware

## 2010-07-10 NOTE — Progress Notes (Signed)
  Subjective:    Patient ID: Kathryn Beck, female    DOB: December 10, 1955, 55 y.o.   MRN: [id number]  HPI The PT is here for annual exam  and re-evaluation of chronic medical conditions and edication management. Lab and radiology data are past due.  Preventive health is updated, specifically  Cancer screening, Osteoporosis screening and Immunization.   Questions or concerns regarding consultations or procedures which the PT has had in the interim are  addressed. The PT denies any adverse reactions to current medications since the last visit.  There are no new concerns.  She has noted a flare of her tinea versicolor , which is not uncommon as the temperature increases. She also suffers from lupus and heat exhaustion , but neither has become a problem as of this time. Kathryn Beck checks her sugars daily and reports that her fasting sugars range from 120 to 160. She had been trying to enrol in a study , but this fell through. She reports cough productive of sputum for the past 2 weeks which is not clearing     Review of Systems See HPI .Denies recent fever or chills. Denies sinus pressure, nasal congestion, ear pain or sore throat.  Denies chest pains, palpitations, paroxysmal nocturnal dyspnea, orthopnea and leg swelling Denies abdominal pain, nausea, vomiting,diarrhea or constipation.  Denies rectal bleeding or change in bowel movement. Denies dysuria, frequency, hesitancy or incontinence. Denies joint pain, swelling and limitation and mobility. Denies headaches, seizure, numbness, or tingling. Denies depression, anxiety or insomnia.       Objective:   Physical Exam Pleasant well nourished female, alert and oriented x 3, in no cardio-pulmonary distress. Afebrile. HEENT No facial trauma or asymetry.   EOMI, PERTL, fundoscopic exam is negative for hemorhages or exudates. External ears normal, tympanic membranes clear. Oropharynx moist, no exudate, good dentition. Neck: supple, no  adenopathy,JVD or thyromegaly.No bruits.  Chest: Decreased air entry, scattered crackles, no wheezes Non tender to palpation  Breast: No asymetry,no masses. No nipple discharge or inversion. No axillary or supraclavicular adenopathy  Cardiovascular system; Heart sounds normal,  S1 and  S2 ,no S3.  No murmur, or thrill. Apical beat not displaced Peripheral pulses normal.  Abdomen: Soft, non tender, no organomegaly or masses. No bruits. Bowel sounds normal. No guarding, tenderness or rebound.  Rectal:  No mass. guaic negative stool.  GU: External genitalia normal. No lesions. Vaginal canal normal.No discharge. Uterus absent, no adnexal masses, no  adnexal tenderness.  Musculoskeletal exam: Full ROM of spine, hips , shoulders and knees. No deformity ,swelling or crepitus noted. No muscle wasting or atrophy.   Neurologic: Cranial nerves 2 to 12 intact. Power, tone ,sensation and reflexes normal throughout. No disturbance in gait. No tremor.  Skin: Intact,no ulceration. Tinea versicolor affecting  trunk and upper extremities Pigmentation normal throughout  Psych; Normal mood and affect. Judgement and concentration normal       Assessment & Plan:  Hypertension:Uncotrolled, medication compliance addressed. Changes in medication made as needed and the importance of commitment to lifestyle changes to improve blood pressure discussed and encouraged. 2. Type 2 DM: need HBA1C current to asses level of control 3.Obesity: lifestyle changes discussed to improve health and promote weight loss 4.Acute bronchitis: meds prescribed. Routine health: mammogram past due

## 2010-07-10 NOTE — Telephone Encounter (Signed)
noted 

## 2010-07-11 ENCOUNTER — Ambulatory Visit (HOSPITAL_COMMUNITY)
Admission: RE | Admit: 2010-07-11 | Discharge: 2010-07-11 | Disposition: A | Discharge status: Home / Self Care | Payer: 59 | Source: Ambulatory Visit | Attending: Family Medicine | Admitting: Family Medicine

## 2010-07-11 DIAGNOSIS — Z139 Encounter for screening, unspecified: Secondary | ICD-10-CM

## 2010-07-11 DIAGNOSIS — Z1231 Encounter for screening mammogram for malignant neoplasm of breast: Secondary | ICD-10-CM | POA: Insufficient documentation

## 2010-07-14 ENCOUNTER — Encounter: Payer: Self-pay | Admitting: *Deleted

## 2010-08-08 NOTE — Op Note (Signed)
NAMEJAMISON, BACARELLA               ACCOUNT NO.:  [id number]   MEDICAL RECORD NO.:  [id number]          PATIENT TYPE:  AMB   LOCATION:  DAY                           FACILITY:  [facility]   PHYSICIAN:  Carole Civil, M.D.DATE OF BIRTH:  [birth year]   DATE OF PROCEDURE:  01/29/2004  DATE OF DISCHARGE:                                 OPERATIVE REPORT   PREOPERATIVE DIAGNOSIS:  Haglund deformity, Achilles tendinosis, and spur,  left heel.   POSTOPERATIVE DIAGNOSIS:  Haglund deformity, Achilles tendinosis, and spur,  left heel.   ANESTHESIA:  General.   PROCEDURE:  Debridement of heel, removal of Haglund's deformity, debridement  of Achilles tendon.   SURGEON:  Carole Civil, M.D.   ASSISTANT:  None.   SPECIMENS:  Bone from the heel.   ESTIMATED BLOOD LOSS:  None.   COMPLICATIONS:  None.   COUNTS:  Correct.   After surgery, the patient went to PACU in good condition.   DETAILS OF PROCEDURE AND HISTORY AND INDICATIONS:  Kathryn Beck is 55 years  old.  She complained of left heel pain, had nonoperative therapy, did not  improve, and presented for left heel Haglund debridement, spur removal, and  debridement of Achilles tendon.   Indications:  Pain.   The patient was identified in the preoperative holding area as Kathryn Beck.  Her medical record revealed that she had signed a consent form to  have her left calcaneus and Achilles tendon operated on.  She marked her  left ankle and heel as the surgical site, and I signed my initials.  I gave  her a gram of Ancef in the preop holding area.  She was taken to the  operating room and intubated.  Once general anesthesia was satisfactory, she  was placed prone with appropriate padding and her left lower extremity was  prepped and draped as a sterile technique.  At that point we took a  mandatory time out.  We identified the patient by arm band as Kathryn Beck.  We confirmed that she was to have her left calcaneus  debrided, spur removed,  and the Achilles tendon debrided.  Everyone agreed, and we proceeded by  elevating the tourniquet after exsanguinating the limb with a four-inch  Esmarch.  The tourniquet was elevated to 300 mmHg.   A curvilinear incision was made over the heel and along the Achilles tendon,  and the subcutaneous tissue was divided bluntly.  The tendon was exposed,  subperiosteal dissection exposed the Haglund deformity.  The tendon was  retracted and a saw was used to remove the Haglund's bone deformity.  This  area was irrigated and covered with bone wax.  We then split the tendon  longitudinally and removed the intratendinous calcification and spurring  with a rasp.  We repaired the tendon side to side with 2-0 Ethibond and  through bone tunnels in the calcaneus with 2-0 Ethibond.  The wound was  irrigated and closed with 2-0 Monocryl and Steri-Strips.  We injected 30 mL  of Marcaine.  We placed her in an air cast, CryoCuff cooler.  After  we  applied the sterile dressings, we placed her supine and extubated her and  took her to the recovery room in stable condition.   Postop plan:  Nonweightbearing.     Kathryn Beck   SEH/MEDQ  D:  01/29/2004  T:  01/29/2004  Job:  [id number]

## 2010-08-08 NOTE — Op Note (Signed)
NAME:  Kathryn Beck, Kathryn Beck                         ACCOUNT NO.:  [id number]   MEDICAL RECORD NO.:  [id number]                   PATIENT TYPE:  AMB   LOCATION:  DAY                                  FACILITY:  [facility]   PHYSICIAN:  Carole Civil, M.D.           DATE OF BIRTH:  [birth year]   DATE OF PROCEDURE:  12/07/2003  DATE OF DISCHARGE:                                 OPERATIVE REPORT   HISTORY:  A 55 year old female with bilateral ankle pain with retrocalcaneal  bursitis and spurring of the Achilles tendon with tendinitis and tendinosis  of both feet, right worse than left. Pain present for 3 to 4 years. She was  treated with nonoperative therapy, included but limited to rest, bracing,  non weight bearing, injections, physical therapy. She did not improve. She  did not improve. She presents for removal of Haglund's deformity,  bursectomy, debridement of tendon as necessary.   INDICATIONS:  Pain.   PREOPERATIVE DIAGNOSIS:  Achilles tendinosis, tendinitis, and Haglund's  deformity, right ankle.   POSTOPERATIVE DIAGNOSIS:  Achilles tendinosis, tendinitis, and Haglund's  deformity, right ankle.   PROCEDURE:  Removal of Haglund's deformity.   SURGEON:  Dr. Aline Brochure.   ANESTHESIA:  General.   FINDINGS:  Achilles intraseptal spurring and tendinosis with Haglund's  deformity.   PROCEDURE:  [name] was identified by arm band in the preoperative  holding area. She marked her right ankle as the operative site. I signed my  initials over this site, gave her a gram of Ancef, and took her to the  operating room for general anesthesia. She was placed supine with a bump  under the right hip and a strap placed over the abdomen. After sterile prep  and drape, we took a time out as required and confirmed the patient's  identity, extremity, and operative site and procedure and confirmed her  antibiotics were given and proceeded after everyone agreed.   Lateral incision was made.  The subcutaneous tissue was divided. The  superficial nerves were preserved. Subperiosteal dissection exposed the  calcaneus. The Achilles tendon was inspected, found to have some  intrasubstance spurring which was noted by palpation at the insertional  site. We marked off the osteotomy site and osteotomized the dorsal portion  of the calcaneus until it became smooth with the insertional site of the  Achilles tendon.   We irrigated the wound, placed bone wax over the bone surfaces, and injected  with 30 cc of Marcaine, closed with 2-0 Vicryl and running nylon suture, and  placed her in a Cryo Cuff after the sterile dressings were applied. The  tourniquet was released, the foot was viable. Radiographs confirmed that the  bone had been removed to an  acceptable level, and she was taken to the recovery room after extubation in  stable condition. Follow up on Monday for dressing change. She can start  physical therapy on Tuesday.   DISCHARGE MEDICATIONS:  Percocet 5 mg 1 every 4 hours p.r.n. for pain #60.      ___________________________________________                                            Carole Civil, M.D.   SEH/MEDQ  D:  12/07/2003  T:  12/07/2003  Job:  [id number]

## 2010-08-08 NOTE — Op Note (Signed)
Kathryn Beck, Kathryn Beck               ACCOUNT NO.:  [id number]   MEDICAL RECORD NO.:  [id number]          PATIENT TYPE:  AMB   LOCATION:  DAY                           FACILITY:  [facility]   PHYSICIAN:  Caro Hight, M.D.      DATE OF BIRTH:  13-Jul-1955   DATE OF PROCEDURE:  06/07/2006  DATE OF DISCHARGE:                               OPERATIVE REPORT   REFERRING PHYSICIAN:  Norwood Levo. Moshe Cipro, M.D.   INDICATIONS FOR EXAM:  Ms. Leister is a 55 year old female with new-  onset and persistent reflux.  Her symptoms have not responded to H2  blockers.  She presents for average risk colon cancer screening.   PROCEDURE:  1. Colonoscopy.  2. Esophagogastroduodenoscopy.   FINDINGS:  1. Normal colon without evidence of polyps, masses, inflammatory      changes, diverticula, or AVMs.  2. Normal retroflexed view of the rectum.  3. Normal esophagus without evidence of Barrett's, erythema, erosions,      or ulcers.  4. Normal stomach.  5. Normal duodenal bulb and second portion of the duodenum.   RECOMMENDATIONS:  1. Begin omeprazole 20 mg b.i.d. for 2 weeks then once daily.  She      should take the omeprazole 30 minutes before breakfast and 30      minutes before dinner.  2. She is asked to follow the recommendations in the [facility] self-      care gastroesophageal reflux disease handout.  She is given a      handout on today.  She is also asked to follow a low-fat diet.  She      is given a handout on a low-fat diet.  3. She is also asked to follow a high-fiber diet.  She is given a      handout on high-fiber diet.  A high-fiber diet is associated with a      decreased risk of polyp formation and colon cancer.  4. Screening colonoscopy in 10 years.  5. Follow-up appointment with Dr. Caro Hight in 6 weeks.   MEDICATIONS:  1. Demerol 50 mg IV.  2. Versed 5 mg IV.   PROCEDURE TECHNIQUE:  A physical exam was performed and informed consent  was obtained from the patient after explaining  the benefits, risks and  alternatives to the procedure.  The patient's was connected to the  monitor and placed in the left lateral position.  Continuous oxygen was  provided by nasal cannula and IV medicine administered through an  indwelling cannula.  After administration of sedation and a rectal exam,  the patient's rectum was intubated and the scope was advanced under  direct visualization to the cecum.  The scope was subsequently removed  slowly by carefully examining the color, texture, anatomy and integrity  of the mucosa on the way out.   After the colonoscopy, the patient's esophagus was intubated and the  scope was advanced under direct visualization to the second portion of  the duodenum.  The scope was subsequently removed slowly by carefully  examining the color, texture, anatomy and integrity of the mucosa  on the  way out.  The patient was recovered in the endoscopy suite and  discharged home in satisfactory condition.      Caro Hight, M.D.  Electronically Signed     SM/MEDQ  D:  06/07/2006  T:  06/08/2006  Job:  [id number]   cc:   Norwood Levo. Moshe Cipro, M.D.  Fax: [phone]

## 2010-08-08 NOTE — H&P (Signed)
NAMESACHIKO, ALSTROM NO.:  [id number]   MEDICAL RECORD NO.:  [id number]          PATIENT TYPE:  REC   LOCATION:  REH                           FACILITY:  [facility]   PHYSICIAN:  Carole Civil, M.D.DATE OF BIRTH:  02-06-1956   DATE OF ADMISSION:  12/24/2003  DATE OF DISCHARGE:  11/02/2005LH                                HISTORY & PHYSICAL   CHIEF COMPLAINT:  Left ankle pain.   HISTORY:  A 55 year old female status post right retrocalcaneal bursectomy  and debridement of Achilles tendon and Achilles spur, who has had bilateral  pain for several years.  She now presents for a left retrocalcaneal  bursectomy with spur removal and debridement of Achilles tendon.  She had a  full course of nonoperative therapy including but not limited to rest,  bracing, non-weight bearing, injections, and physical therapy.  She  understands the risks and benefits of the procedure.  Her right side is  doing well at this time.   REVIEW OF SYSTEMS:  Negative except for the immunologic system, in which she  reports seasonal allergies.   ALLERGIES:  No allergies.   MEDICAL HISTORY:  Hypertension.   MEDICINES:  1.  Claritin.  2.  Zocor.  3.  Diovan.  4.  Lotrel.  She fills these at the [facility] in [place].   FAMILY HISTORY:  Negative.   FAMILY PHYSICIAN:  Dr. Moshe Cipro.   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  She does not  smoke or drink.  She completed grade 1-12.   EXAMINATION:  VITAL SIGNS:  Shows a weight of 210.  Pulse 82, respiratory  rate of 18.  GENERAL APPEARANCE:  Normal.  CARDIOVASCULAR:  No swelling,  pulses are intact.  EXTREMITIES:  Warm.  CERVICAL LYMPH NODES:  Negative.  MUSCULOSKELETAL:  Gait and station were normal.  The left ankle has  tenderness in the Achilles tendon.  There is retrocalcaneal bursitis with  swelling and tenderness.  There is painful range of motion of the joint.  Her right ankle joint was stable.  Plantar  flexion strength was normal.  SKIN:  Intact.  NEUROPSYCHIATRIC:  Exam was normal.   RADIOGRAPHS:  Show a calcaneal spur with Haglund's deformity, mild.  There  is a spur on the Achilles tendon also noted.   PLAN:  She presents for left retrocalcaneal bursectomy with debridement of  the Achilles tendon and removal of the spur.     Cherlynn Kaiser   SEH/MEDQ  D:  01/24/2004  T:  01/24/2004  Job:  [id number]   cc:   Norwood Levo. Moshe Cipro, M.D.  [address]  [place], [place] [address]  Fax: [facility]  Fax: [phone]

## 2010-08-08 NOTE — H&P (Signed)
NAME:  Kathryn Beck, Kathryn Beck                         ACCOUNT NO.:  [id number]   MEDICAL RECORD NO.:  [id number]                   PATIENT TYPE:  AMB   LOCATION:  DAY                                  FACILITY:  [facility]   PHYSICIAN:  Carole Civil, M.D.           DATE OF BIRTH:  06/29/1955   DATE OF ADMISSION:  DATE OF DISCHARGE:                                HISTORY & PHYSICAL   CHIEF COMPLAINT:  Ankles hurting.   This is a 55 year old female with bilateral ankle pain who has a  retrocalcaneal bursitis with spurring and Achilles tendonitis and tendinosis  in both feet.  She has had pain for 3-4 years.  She has had various forms of  nonoperative therapy including but not limited to rest, bracing,  nonweightbearing, injection, physical therapy, the right side is worse than  the left and she presents for right ankle surgery.   Review of systems are negative except for the immunologic system which she  reports seasonal allergies.  She has a past history with no allergies,  medical history of hypertension, medicines of Claritin, Zocor, Diovan,  Lotrel, she fills these at [facility].   Family history negative.  Family physician Dr. Moshe Cipro.   SOCIAL HISTORY:  She is married, a full time student, does not smoke or  drink, caffeine use yes, grades completed 1-12.   EXAMINATION:  Weight 212, pulse 82, respiratory rate 20.  General appearance  normal.  Cardiovascular exam no swelling, pulses are intact, extremities are  warm, cervical lymph nodes are normal, gait and station are normal.   Both ankles have retrocalcaneal swelling with tenderness, decreased range of  motion which is painful, weakness in plantar flexion, ankles joints are  stable.   Skin is intact.  Neuropsych exam is normal.  Radiographs show calcaneal  spurs with mild Haglund-type deformity and prominence.  Spurs are in the  Achilles tendon insertional area.   PLAN:  She presents for a right retrocalcaneal  bursectomy, debridement of  Achilles tendon and removal of spur.     ___________________________________________                                         Carole Civil, M.D.   SEH/MEDQ  D:  12/04/2003  T:  12/04/2003  Job:  [id number]

## 2010-08-19 ENCOUNTER — Encounter: Payer: Self-pay | Admitting: Family Medicine

## 2010-08-19 ENCOUNTER — Other Ambulatory Visit: Payer: Self-pay | Admitting: Family Medicine

## 2010-08-20 ENCOUNTER — Encounter: Payer: Self-pay | Admitting: Family Medicine

## 2010-08-20 ENCOUNTER — Ambulatory Visit (INDEPENDENT_AMBULATORY_CARE_PROVIDER_SITE_OTHER): Payer: 59 | Admitting: Family Medicine

## 2010-08-20 VITALS — BP 120/80 | HR 73 | Resp 16 | Ht 66.0 in | Wt 202.0 lb

## 2010-08-20 DIAGNOSIS — I10 Essential (primary) hypertension: Secondary | ICD-10-CM

## 2010-08-20 DIAGNOSIS — E785 Hyperlipidemia, unspecified: Secondary | ICD-10-CM

## 2010-08-20 DIAGNOSIS — E119 Type 2 diabetes mellitus without complications: Secondary | ICD-10-CM

## 2010-08-20 DIAGNOSIS — L930 Discoid lupus erythematosus: Secondary | ICD-10-CM

## 2010-08-20 LAB — CBC WITH DIFFERENTIAL/PLATELET
Basophils Relative: 1 % (ref 0–1)
Eosinophils Absolute: 0.1 10*3/uL (ref 0.0–0.7)
Eosinophils Relative: 1 % (ref 0–5)
Lymphs Abs: 3.3 10*3/uL (ref 0.7–4.0)
MCH: 22.1 pg — ABNORMAL LOW (ref 26.0–34.0)
MCHC: 31 g/dL (ref 30.0–36.0)
MCV: 71.5 fL — ABNORMAL LOW (ref 78.0–100.0)
Platelets: 296 10*3/uL (ref 150–400)
RBC: 4.7 MIL/uL (ref 3.87–5.11)

## 2010-08-20 LAB — ANEMIA PANEL
Ferritin: 362 ng/mL — ABNORMAL HIGH (ref 10–291)
Iron: 77 ug/dL (ref 42–145)
Retic Ct Pct: 1.2 % (ref 0.4–3.1)
Vitamin B-12: 239 pg/mL (ref 211–911)

## 2010-08-20 LAB — BASIC METABOLIC PANEL
CO2: 28 mEq/L (ref 19–32)
Chloride: 105 mEq/L (ref 96–112)
Potassium: 4.6 mEq/L (ref 3.5–5.3)
Sodium: 142 mEq/L (ref 135–145)

## 2010-08-20 LAB — HEPATIC FUNCTION PANEL
ALT: 32 U/L (ref 0–35)
AST: 36 U/L (ref 0–37)
Alkaline Phosphatase: 66 U/L (ref 39–117)
Bilirubin, Direct: <0.1 mg/dL (ref 0.0–0.3)

## 2010-08-20 MED ORDER — HYDROXYZINE HCL 25 MG PO TABS: 25.0000 mg | ORAL_TABLET | Freq: Three times a day (TID) | ORAL | Status: DC | PRN

## 2010-08-20 MED ORDER — TRIAMTERENE-HCTZ 75-50 MG PO TABS: 1.0000 | ORAL_TABLET | Freq: Every day | ORAL | Status: DC

## 2010-08-20 MED ORDER — GLIPIZIDE ER 10 MG PO TB24: 10.0000 mg | ORAL_TABLET | Freq: Every day | ORAL | Status: DC

## 2010-08-20 NOTE — Patient Instructions (Addendum)
F/U in 3 months.  You need to increase the metformin to one TWICE daily as prescribed, your blood sugar is uncontrolled. The dose of glipizide is increased to 10mg  once daily, the 5mg  tablets are to be taken TWO once daily till done  It is important that you exercise regularly at least 30 minutes 5 times a week. If you develop chest pain, have severe difficulty breathing, or feel very tired, stop exercising immediately and seek medical attention   PLS test sugars once daily  Fasting range is 80 to 120, and bedtime or 2 hours after any meal is 130 to 170

## 2010-08-20 NOTE — Progress Notes (Signed)
added

## 2010-08-21 LAB — LIPID PANEL
Cholesterol: 233 mg/dL — ABNORMAL HIGH (ref 0–200)
Triglycerides: 196 mg/dL — ABNORMAL HIGH (ref <150)
VLDL: 39 mg/dL (ref 0–40)

## 2010-08-25 MED ORDER — PRAVASTATIN SODIUM 80 MG PO TABS: 80.0000 mg | ORAL_TABLET | Freq: Every evening | ORAL | Status: DC

## 2010-08-25 NOTE — Progress Notes (Signed)
Addended by: Eual Fines on: 08/25/2010 10:41 AM   Modules accepted: Orders, Medications

## 2010-08-31 ENCOUNTER — Encounter: Payer: Self-pay | Admitting: Family Medicine

## 2010-08-31 NOTE — Assessment & Plan Note (Signed)
Controlled, no change in medication  

## 2010-08-31 NOTE — Assessment & Plan Note (Signed)
Unchanged, now that it is warmer , she is having urticarial flare

## 2010-08-31 NOTE — Progress Notes (Signed)
  Subjective:    Patient ID: Kathryn Beck, female    DOB: [birth year], 55 y.o.   MRN: [id number]  HPI  The PT is here for follow up and re-evaluation of chronic medical conditions, medication management and review of recent lab and radiology data.  Preventive health is updated, specifically  Cancer screening, Osteoporosis screening and Immunization.   Questions or concerns regarding consultations or procedures which the PT has had in the interim are  addressed. The PT denies any adverse reactions to current medications since the last visit.  There are no new concerns.  There are no specific complaints    HYPERTENSION Disease Monitoring Blood pressure range-unknown Chest pain- no      Dyspnea- no Medications Compliance- good Lightheadedness- no   Edema- no   DIABETES Disease Monitoring Blood Sugar ranges-fasting between 130 and 140 Polyuria- no New Visual problems- no Medications Compliance- poor, has been off med for the past approx 4 weeks Hypoglycemic symptoms- no   HYPERLIPIDEMIA Disease Monitoring See symptoms for Hypertension Medications Compliance- good RUQ pain- no  Muscle aches- no  Smoking Status noted    Review of Systems Denies recent fever or chills. Denies sinus pressure, nasal congestion, ear pain or sore throat. Denies chest congestion, productive cough or wheezing. Denies chest pains, palpitations, paroxysmal nocturnal dyspnea, orthopnea and leg swelling Denies abdominal pain, nausea, vomiting,diarrhea or constipation.  Denies rectal bleeding or change in bowel movement. Denies dysuria, frequency, hesitancy or incontinence. Denies joint pain, swelling and limitation in mobility. Denies headaches, seizure, numbness, or tingling. Denies depression, anxiety or insomnia. C/o rash with minimal sun exposure, this is not new, and she knows to avoid the heat as much as possible       Objective:   Physical Exam Patient alert and oriented and in no  Cardiopulmonary distress.  HEENT: No facial asymmetry, EOMI, no sinus tenderness, TM's clear, Oropharynx pink and moist.  Neck supple no adenopathy.  Chest: Clear to auscultation bilaterally.  CVS: S1, S2 no murmurs, no S3.  ABD: Soft non tender. Bowel sounds normal.  Ext: No edema  MS: Adequate ROM spine, shoulders, hips and knees.  Skin: Intact, urticaria on forearms  Psych: Good eye contact, normal affect. Memory intact not anxious or depressed appearing.  CNS: CN 2-12 intact, power, tone and sensation normal throughout.        Assessment & Plan:

## 2010-08-31 NOTE — Assessment & Plan Note (Signed)
Deteriorated, med compliance and following a low fat diet strssed

## 2010-08-31 NOTE — Assessment & Plan Note (Signed)
Uncontrolled, pt has been non compliant with med, importance of same is stressed, also regular testing

## 2010-10-27 ENCOUNTER — Ambulatory Visit: Payer: 59 | Admitting: Family Medicine

## 2010-11-21 ENCOUNTER — Encounter: Payer: Self-pay | Admitting: Family Medicine

## 2010-11-25 ENCOUNTER — Other Ambulatory Visit: Payer: Self-pay | Admitting: Family Medicine

## 2010-11-25 ENCOUNTER — Encounter: Payer: Self-pay | Admitting: Family Medicine

## 2010-11-25 ENCOUNTER — Ambulatory Visit (INDEPENDENT_AMBULATORY_CARE_PROVIDER_SITE_OTHER): Payer: 59 | Admitting: Family Medicine

## 2010-11-25 VITALS — BP 110/80 | HR 64 | Resp 14 | Ht 66.0 in | Wt 199.0 lb

## 2010-11-25 DIAGNOSIS — E119 Type 2 diabetes mellitus without complications: Secondary | ICD-10-CM

## 2010-11-25 DIAGNOSIS — E860 Dehydration: Secondary | ICD-10-CM

## 2010-11-25 DIAGNOSIS — R0989 Other specified symptoms and signs involving the circulatory and respiratory systems: Secondary | ICD-10-CM

## 2010-11-25 DIAGNOSIS — Z23 Encounter for immunization: Secondary | ICD-10-CM

## 2010-11-25 DIAGNOSIS — IMO0001 Reserved for inherently not codable concepts without codable children: Secondary | ICD-10-CM

## 2010-11-25 DIAGNOSIS — E785 Hyperlipidemia, unspecified: Secondary | ICD-10-CM

## 2010-11-25 DIAGNOSIS — M549 Dorsalgia, unspecified: Secondary | ICD-10-CM

## 2010-11-25 DIAGNOSIS — N3090 Cystitis, unspecified without hematuria: Secondary | ICD-10-CM

## 2010-11-25 DIAGNOSIS — I10 Essential (primary) hypertension: Secondary | ICD-10-CM

## 2010-11-25 DIAGNOSIS — R42 Dizziness and giddiness: Secondary | ICD-10-CM

## 2010-11-25 DIAGNOSIS — E1165 Type 2 diabetes mellitus with hyperglycemia: Secondary | ICD-10-CM

## 2010-11-25 DIAGNOSIS — IMO0002 Reserved for concepts with insufficient information to code with codable children: Secondary | ICD-10-CM

## 2010-11-25 LAB — POCT URINALYSIS DIPSTICK
Bilirubin, UA: NEGATIVE
Ketones, UA: NEGATIVE
Leukocytes, UA: NEGATIVE
Nitrite, UA: NEGATIVE
pH, UA: 5

## 2010-11-25 LAB — LIPID PANEL
HDL: 42 mg/dL (ref >39)
LDL Cholesterol: 143 mg/dL — ABNORMAL HIGH (ref 0–99)
Total CHOL/HDL Ratio: 5.7 Ratio
Triglycerides: 269 mg/dL — ABNORMAL HIGH (ref <150)
VLDL: 54 mg/dL — ABNORMAL HIGH (ref 0–40)

## 2010-11-25 LAB — HEMOGLOBIN A1C: Mean Plasma Glucose: 197 mg/dL — ABNORMAL HIGH (ref <117)

## 2010-11-25 MED ORDER — MECLIZINE HCL 12.5 MG PO TABS: 12.5000 mg | ORAL_TABLET | Freq: Three times a day (TID) | ORAL | Status: DC | PRN

## 2010-11-25 MED ORDER — CYCLOBENZAPRINE HCL 10 MG PO TABS: ORAL_TABLET | ORAL | Status: DC

## 2010-11-25 MED ORDER — VALSARTAN 320 MG PO TABS: 320.0000 mg | ORAL_TABLET | Freq: Every day | ORAL | Status: DC

## 2010-11-25 MED ORDER — INFLUENZA VAC TYPES A & B PF IM SUSP: 0.5000 mL | Freq: Once | INTRAMUSCULAR | Status: DC

## 2010-11-25 NOTE — Assessment & Plan Note (Signed)
Deteriorated, start muscle relaxant

## 2010-11-25 NOTE — Assessment & Plan Note (Signed)
Symptoms improving, had recent viral illness, antivert prescribed for symptomatic use

## 2010-11-25 NOTE — Assessment & Plan Note (Signed)
Uncontrolled, advised to follow diet and check blood sugars daily as well as attend class

## 2010-11-25 NOTE — Progress Notes (Signed)
  Subjective:    Patient ID: Kathryn Beck, female    DOB: [birth year], 55 y.o.   MRN: [id number]  HPI The PT is here for follow up and re-evaluation of chronic medical conditions, medication management and review of any available recent lab and radiology data.  Preventive health is updated, specifically  Cancer screening and Immunization.   Questions or concerns regarding consultations or procedures which the PT has had in the interim are  addressed. The PT denies any adverse reactions to current medications since the last visit.  Testing sugars on average 3 times weekly, fasting values avg between 140 to 160, denies hypoglycemic episodes, polyuria or polydipsia. Acute severe vertigo x 6 days, had preceding viral illness, symptoms improving slightly. Reports recurrent episodes of light headedness, and near passing out spells, denies chest pain, palpitations, PND , orthopnea or leg swelling    Review of Systems See HPI Denies recent fever or chills. Denies sinus pressure, nasal congestion, ear pain or sore throat. Denies chest congestion, productive cough or wheezing. Denies chest pains, palpitations and leg swelling Denies abdominal pain, nausea, vomiting,diarrhea or constipation.   Denies dysuria, frequency, hesitancy or incontinence. Denies joint pain, swelling and limitation in mobility. Denies headaches, seizures, numbness, or tingling. Denies depression, anxiety or insomnia. Denies skin break down or rash.         Objective:   Physical Exam  Patient alert and oriented and in no cardiopulmonary distress.  HEENT: No facial asymmetry, EOMI, no sinus tenderness,  oropharynx pink and moist.  Neck supple no adenopathy.carotid bruit  Chest: Clear to auscultation bilaterally.  CVS: S1, S2 no murmurs, no S3.  ABD: Soft non tender. Bowel sounds normal.  Ext: No edema  MS: Adequate ROM spine, shoulders, hips and knees.  Skin: Intact, tinea pedis and onychomycosis  Psych:  Good eye contact, normal affect. Memory intact not anxious or depressed appearing.  CNS: CN 2-12 intact, power, tone and sensation normal throughout.  Diabetic Foot Check:  Appearance -  Calluses and tinea pedis Skin - no unusual pallor or redness Sensation - grossly intact to light touch Monofilament testing -  Right - Great toe, medial, central, lateral ball and posterior foot diminished Left - Great toe, medial, central, lateral ball and posterior foot diminished Pulses Left - Dorsalis Pedis and Posterior Tibia normal Right - Dorsalis Pedis and Posterior Tibia normal      Assessment & Plan:

## 2010-11-25 NOTE — Patient Instructions (Addendum)
F/u in mid January.  Flu vaccine today.  Labs today, fasting.   hBA1C and, CMP, lipid fasting  in January BEFORE visit.  LABWORK  NEEDS TO BE DONE BETWEEN 3 TO 7 DAYS BEFORE YOUR NEXT SCEDULED  VISIT.  THIS WILL IMPROVE THE QUALITY OF YOUR CARE.   Medication sent in for "inner ear" or vertigo.  You are being referred for carotid doppler to ensure no blockage in neck arteries  New muscle relaxant for spasm.  Urine is being tested , I will treat if abnormal.  Pls keep eye appt and sched appt with nephrology.  TEST sugars ONCE EVERY day, pls and call and attend group session to improve the way you eat sio your sugars will improve

## 2010-11-25 NOTE — Assessment & Plan Note (Signed)
Controlled, no change in medication  

## 2010-11-25 NOTE — Assessment & Plan Note (Signed)
Symptomatic with recurrent light headedness, carotid doppler ordered

## 2010-11-25 NOTE — Assessment & Plan Note (Signed)
C/o back pain with mild dysuria, urinalysis is normal pt reassured

## 2010-11-25 NOTE — Assessment & Plan Note (Signed)
Uncontrolled, lab today

## 2010-11-26 LAB — COMPLETE METABOLIC PANEL WITH GFR
ALT: 67 U/L — ABNORMAL HIGH (ref 0–35)
AST: 74 U/L — ABNORMAL HIGH (ref 0–37)
Alkaline Phosphatase: 70 U/L (ref 39–117)
Creat: 1.75 mg/dL — ABNORMAL HIGH (ref 0.50–1.10)
Sodium: 139 mEq/L (ref 135–145)
Total Bilirubin: 0.3 mg/dL (ref 0.3–1.2)
Total Protein: 7.9 g/dL (ref 6.0–8.3)

## 2010-11-27 ENCOUNTER — Other Ambulatory Visit: Payer: Self-pay | Admitting: Family Medicine

## 2010-11-27 ENCOUNTER — Telehealth: Payer: Self-pay | Admitting: Family Medicine

## 2010-11-27 ENCOUNTER — Ambulatory Visit (HOSPITAL_COMMUNITY)
Admission: RE | Admit: 2010-11-27 | Discharge: 2010-11-27 | Disposition: A | Discharge status: Home / Self Care | Payer: 59 | Source: Ambulatory Visit | Attending: Family Medicine | Admitting: Family Medicine

## 2010-11-27 DIAGNOSIS — K759 Inflammatory liver disease, unspecified: Secondary | ICD-10-CM

## 2010-11-27 DIAGNOSIS — R55 Syncope and collapse: Secondary | ICD-10-CM | POA: Insufficient documentation

## 2010-11-27 DIAGNOSIS — I10 Essential (primary) hypertension: Secondary | ICD-10-CM | POA: Insufficient documentation

## 2010-11-27 DIAGNOSIS — R0989 Other specified symptoms and signs involving the circulatory and respiratory systems: Secondary | ICD-10-CM | POA: Insufficient documentation

## 2010-11-27 DIAGNOSIS — IMO0001 Reserved for inherently not codable concepts without codable children: Secondary | ICD-10-CM

## 2010-11-27 DIAGNOSIS — H538 Other visual disturbances: Secondary | ICD-10-CM | POA: Insufficient documentation

## 2010-11-27 DIAGNOSIS — E119 Type 2 diabetes mellitus without complications: Secondary | ICD-10-CM | POA: Insufficient documentation

## 2010-11-27 MED ORDER — INSULIN DETEMIR 100 UNIT/ML ~~LOC~~ SOLN: 30.0000 [IU] | Freq: Every day | SUBCUTANEOUS | Status: DC

## 2010-11-27 NOTE — Telephone Encounter (Signed)
Attempted to speak  With pt to verify ok to spk with her daughter, no res[ponse at the numbers, however, daughter states Mom had given verbal permission earlier to office staff, which I accept as true.So i will discuss the result s with her daughter at this time

## 2010-11-27 NOTE — Telephone Encounter (Signed)
Spoke with patient 's daughter she understands what is going, she will make every effort to attend the next session with her mOm

## 2010-11-30 LAB — HEPATITIS PANEL, ACUTE
HCV Ab: NEGATIVE
Hep A IgM: NEGATIVE
Hepatitis B Surface Ag: NEGATIVE

## 2010-12-01 ENCOUNTER — Telehealth: Payer: Self-pay | Admitting: Family Medicine

## 2010-12-02 ENCOUNTER — Other Ambulatory Visit: Payer: Self-pay

## 2010-12-02 ENCOUNTER — Ambulatory Visit (HOSPITAL_COMMUNITY)
Admission: RE | Admit: 2010-12-02 | Discharge: 2010-12-02 | Disposition: A | Discharge status: Home / Self Care | Payer: 59 | Source: Ambulatory Visit | Attending: Family Medicine | Admitting: Family Medicine

## 2010-12-02 DIAGNOSIS — K759 Inflammatory liver disease, unspecified: Secondary | ICD-10-CM

## 2010-12-02 DIAGNOSIS — IMO0001 Reserved for inherently not codable concepts without codable children: Secondary | ICD-10-CM

## 2010-12-02 DIAGNOSIS — R110 Nausea: Secondary | ICD-10-CM | POA: Insufficient documentation

## 2010-12-02 DIAGNOSIS — E119 Type 2 diabetes mellitus without complications: Secondary | ICD-10-CM | POA: Insufficient documentation

## 2010-12-02 DIAGNOSIS — R748 Abnormal levels of other serum enzymes: Secondary | ICD-10-CM | POA: Insufficient documentation

## 2010-12-02 LAB — BASIC METABOLIC PANEL
BUN: 17 mg/dL (ref 6–23)
CO2: 20 mEq/L (ref 19–32)
Calcium: 9.5 mg/dL (ref 8.4–10.5)
Chloride: 105 mEq/L (ref 96–112)
Creat: 0.89 mg/dL (ref 0.50–1.10)

## 2010-12-02 MED ORDER — INSULIN DETEMIR 100 UNIT/ML ~~LOC~~ SOLN: 30.0000 [IU] | Freq: Every day | SUBCUTANEOUS | Status: DC

## 2010-12-02 NOTE — Telephone Encounter (Signed)
Patient was here this morning and was made aware of titration.

## 2010-12-02 NOTE — Progress Notes (Signed)
Addended by: Eual Fines on: 12/02/2010 08:52 AM   Modules accepted: Orders

## 2010-12-02 NOTE — Progress Notes (Signed)
Patient came in for titration teaching. Understands and levemir was sent to [facility]

## 2010-12-03 ENCOUNTER — Telehealth: Payer: Self-pay | Admitting: *Deleted

## 2010-12-03 NOTE — Telephone Encounter (Signed)
Patient aware of test results.

## 2010-12-03 NOTE — Telephone Encounter (Signed)
Patient aware of lab results.

## 2010-12-03 NOTE — Telephone Encounter (Signed)
Message copied by Christia Reading on Wed Dec 03, 2010 10:18 AM ------      Message from: Tula Nakayama MD E      Created: Wed Dec 03, 2010  8:35 AM       pls advise blood test much improved and back to normal values, keep water intake as she is doing now  GREAT!

## 2010-12-03 NOTE — Telephone Encounter (Signed)
Message copied by Christia Reading on Wed Dec 03, 2010 10:17 AM ------      Message from: Tula Nakayama MD E      Created: Tue Dec 02, 2010 10:07 AM       pls advise she appears to have fatty liver, weight loss will help this, no gallstones seen

## 2010-12-16 LAB — BASIC METABOLIC PANEL
BUN: 25 — ABNORMAL HIGH
Calcium: 9.3
GFR calc non Af Amer: 35 — ABNORMAL LOW
Glucose, Bld: 196 — ABNORMAL HIGH
Potassium: 4
Sodium: 139

## 2011-01-08 ENCOUNTER — Encounter: Payer: Self-pay | Admitting: Family Medicine

## 2011-01-13 ENCOUNTER — Encounter: Payer: Self-pay | Admitting: Family Medicine

## 2011-01-13 ENCOUNTER — Ambulatory Visit (INDEPENDENT_AMBULATORY_CARE_PROVIDER_SITE_OTHER): Payer: 59 | Admitting: Family Medicine

## 2011-01-13 VITALS — BP 130/82 | HR 69 | Resp 16 | Ht 66.0 in | Wt 189.1 lb

## 2011-01-13 DIAGNOSIS — E119 Type 2 diabetes mellitus without complications: Secondary | ICD-10-CM

## 2011-01-13 DIAGNOSIS — E785 Hyperlipidemia, unspecified: Secondary | ICD-10-CM

## 2011-01-13 DIAGNOSIS — I10 Essential (primary) hypertension: Secondary | ICD-10-CM

## 2011-01-13 DIAGNOSIS — R42 Dizziness and giddiness: Secondary | ICD-10-CM

## 2011-01-13 DIAGNOSIS — G4730 Sleep apnea, unspecified: Secondary | ICD-10-CM

## 2011-01-13 DIAGNOSIS — N189 Chronic kidney disease, unspecified: Secondary | ICD-10-CM

## 2011-01-13 DIAGNOSIS — N184 Chronic kidney disease, stage 4 (severe): Secondary | ICD-10-CM | POA: Insufficient documentation

## 2011-01-13 MED ORDER — MECLIZINE HCL 12.5 MG PO TABS: 12.5000 mg | ORAL_TABLET | Freq: Three times a day (TID) | ORAL | Status: DC | PRN

## 2011-01-13 MED ORDER — HYDROCODONE-ACETAMINOPHEN 5-500 MG PO TABS: 1.0000 | ORAL_TABLET | Freq: Every day | ORAL | Status: DC | PRN

## 2011-01-13 MED ORDER — INSULIN DETEMIR 100 UNIT/ML ~~LOC~~ SOLN: SUBCUTANEOUS | Status: DC

## 2011-01-13 NOTE — Patient Instructions (Signed)
F/u mid December , pls cancel January appt.  HBA1C, chem 7, lipid, non fasting before visit.  You have been referred to Dr Birdie Sons about your kidneys, pls keep appt.  You NEED to use your CPAP machine every night for sleep apnea.  Increase levimir to 30 units daily starting tomorrow, after one week if blood sugars are still high increase to 35 units daily.  Please follow a low carb diet amd commit to 30 minutes daily of exercise

## 2011-02-10 NOTE — Assessment & Plan Note (Signed)
Controlled, no change in medication  

## 2011-02-10 NOTE — Assessment & Plan Note (Signed)
Low fat diet only, marked elevation in liver enzymes so will not use statins at this time

## 2011-02-10 NOTE — Assessment & Plan Note (Signed)
Due to worsening proteinuria , pt referred for nephrology eval

## 2011-02-10 NOTE — Progress Notes (Signed)
  Subjective:    Patient ID: Kathryn Beck, female    DOB: 02-24-1956, 55 y.o.   MRN: [id number]  HPI The PT is here for follow up and re-evaluation of chronic medical conditions, medication management and review of any available recent lab and radiology data.  Preventive health is updated, specifically  Cancer screening and Immunization.   Questions or concerns regarding consultations or procedures which the PT has had in the interim are  addressed. The PT denies any adverse reactions to current medications since the last visit.  Fasting blood sugars are still elevated and uncontrolled, often over 160. Concerned about the worsening proteinuria and will f/u nephrology, who she has seen in the past C/o fatigue, however , non compliant with CPAP and also not exercising regularly      Review of Systems See HPI Denies recent fever or chills. Denies sinus pressure, nasal congestion, ear pain or sore throat. Denies chest congestion, productive cough or wheezing. Denies chest pains, palpitations and leg swelling Denies abdominal pain, nausea, vomiting,diarrhea or constipation.   Denies dysuria, frequency, hesitancy or incontinence. Denies joint pain, swelling and limitation in mobility. Denies headaches, seizures, numbness, or tingling. Denies depression, anxiety or insomnia.        Objective:   Physical Exam  Patient alert and oriented and in no cardiopulmonary distress.  HEENT: No facial asymmetry, EOMI, no sinus tenderness,  oropharynx pink and moist.  Neck supple no adenopathy.  Chest: Clear to auscultation bilaterally.  CVS: S1, S2 no murmurs, no S3.  ABD: Soft non tender. Bowel sounds normal.  Ext: No edema  MS: Adequate ROM spine, shoulders, hips and knees.  Skin: Intact, no ulcerations or rash noted.  Psych: Good eye contact, normal affect. Memory intact not anxious or depressed appearing.  CNS: CN 2-12 intact, power, tone and sensation normal  throughout.       Assessment & Plan:

## 2011-02-10 NOTE — Assessment & Plan Note (Addendum)
Uncontrolled, dose increase on medication, as well as increased commitment to lifestyle change

## 2011-02-10 NOTE — Assessment & Plan Note (Signed)
Encouraged to use CPAP daily, which will help to protect heart and lungs and improve energy

## 2011-03-04 ENCOUNTER — Encounter: Payer: Self-pay | Admitting: Family Medicine

## 2011-03-10 ENCOUNTER — Ambulatory Visit: Payer: 59 | Admitting: Family Medicine

## 2011-04-06 ENCOUNTER — Ambulatory Visit: Payer: 59 | Admitting: Family Medicine

## 2011-05-11 ENCOUNTER — Encounter: Payer: Self-pay | Admitting: Family Medicine

## 2011-05-11 ENCOUNTER — Ambulatory Visit (INDEPENDENT_AMBULATORY_CARE_PROVIDER_SITE_OTHER): Payer: 59 | Admitting: Family Medicine

## 2011-05-11 VITALS — BP 170/100 | HR 75 | Resp 18 | Ht 66.0 in | Wt 207.1 lb

## 2011-05-11 DIAGNOSIS — E785 Hyperlipidemia, unspecified: Secondary | ICD-10-CM

## 2011-05-11 DIAGNOSIS — E1165 Type 2 diabetes mellitus with hyperglycemia: Secondary | ICD-10-CM

## 2011-05-11 DIAGNOSIS — E119 Type 2 diabetes mellitus without complications: Secondary | ICD-10-CM

## 2011-05-11 DIAGNOSIS — N189 Chronic kidney disease, unspecified: Secondary | ICD-10-CM

## 2011-05-11 DIAGNOSIS — E669 Obesity, unspecified: Secondary | ICD-10-CM

## 2011-05-11 DIAGNOSIS — I10 Essential (primary) hypertension: Secondary | ICD-10-CM

## 2011-05-11 DIAGNOSIS — N058 Unspecified nephritic syndrome with other morphologic changes: Secondary | ICD-10-CM

## 2011-05-11 DIAGNOSIS — E1129 Type 2 diabetes mellitus with other diabetic kidney complication: Secondary | ICD-10-CM

## 2011-05-11 MED ORDER — VALSARTAN 320 MG PO TABS: 320.0000 mg | ORAL_TABLET | Freq: Every day | ORAL | Status: DC

## 2011-05-11 NOTE — Patient Instructions (Signed)
F/u after March 6  Fasting lipid, hepatic, HBA1C on march 4, and results to Dr Dorris Fetch   It is important that you exercise regularly at least 30 minutes 5 times a week. If you develop chest pain, have severe difficulty breathing, or feel very tired, stop exercising immediately and seek medical attention   A healthy diet is rich in fruit, vegetables and whole grains. Poultry fish, nuts and beans are a healthy choice for protein rather then red meat. A low sodium diet and drinking 64 ounces of water daily is generally recommended. Oils and sweet should be limited. Carbohydrates especially for those who are diabetic or overweight, should be limited to 34-45 gram per meal. It is important to eat on a regular schedule, at least 3 times daily. Snacks should be primarily fruits, vegetables or nuts.   You are referred to dr Dorris Fetch, mid March  Blood pressure high, ensure you take clonidine on a schedule every 8 hrs, eg 6am, 2pm and 10pm

## 2011-05-11 NOTE — Assessment & Plan Note (Addendum)
Uncontrolled, the importance of taking clonidine on a regular schedule is stressed

## 2011-05-12 NOTE — Assessment & Plan Note (Signed)
Deteriorated. Patient re-educated about  the importance of commitment to a  minimum of 150 minutes of exercise per week. The importance of healthy food choices with portion control discussed. Encouraged to start a food diary, count calories and to consider  joining a support group. Sample diet sheets offered. Goals set by the patient for the next several months.    

## 2011-05-12 NOTE — Assessment & Plan Note (Signed)
Uncontrolled with nephropathy refer to endocrine

## 2011-05-12 NOTE — Progress Notes (Signed)
  Subjective:    Patient ID: Kathryn Beck, female    DOB: September 01, 1955, 56 y.o.   MRN: [id number]  HPI The PT is here for follow up and re-evaluation of chronic medical conditions, medication management and review of any available recent lab and radiology data.  Preventive health is updated, specifically  Cancer screening and Immunization.   Questions or concerns regarding consultations or procedures which the PT has had in the interim are  addressed. The PT denies any adverse reactions to current medications since the last visit.  Reports blood sugars continue to fluctuate and remain uncontrolled, fasting sugars are seldom under 140     Review of Systems See HPI Denies recent fever or chills. Denies sinus pressure, nasal congestion, ear pain or sore throat. Denies chest congestion, productive cough or wheezing. Denies chest pains, palpitations and leg swelling Denies abdominal pain, nausea, vomiting,diarrhea or constipation.   Denies dysuria, frequency, hesitancy or incontinence. Generalized joint pains Denies headaches, seizures, numbness, or tingling. Denies depression, anxiety or insomnia. Chronic skin rash, non pruritic        Objective:   Physical Exam Patient alert and oriented and in no cardiopulmonary distress.  HEENT: No facial asymmetry, EOMI, no sinus tenderness,  oropharynx pink and moist.  Neck supple no adenopathy.  Chest: Clear to auscultation bilaterally.  CVS: S1, S2 no murmurs, no S3.  ABD: Soft non tender. Bowel sounds normal.  Ext: No edema  MS: Adequate ROM spine, shoulders, hips and knees.  Skin: Intact,hyperpigmented macular  rash noted.  Psych: Good eye contact, normal affect. Memory intact not anxious or depressed appearing.  CNS: CN 2-12 intact, power, tone and sensation normal throughout.        Assessment & Plan:

## 2011-05-12 NOTE — Assessment & Plan Note (Signed)
Now being followed by nephrology

## 2011-05-28 ENCOUNTER — Emergency Department (HOSPITAL_COMMUNITY)
Admission: EM | Admit: 2011-05-28 | Discharge: 2011-05-28 | Disposition: A | Discharge status: Home / Self Care | Payer: 59 | Attending: Emergency Medicine | Admitting: Emergency Medicine

## 2011-05-28 ENCOUNTER — Encounter (HOSPITAL_COMMUNITY): Payer: Self-pay | Admitting: *Deleted

## 2011-05-28 ENCOUNTER — Emergency Department (HOSPITAL_COMMUNITY): Payer: 59

## 2011-05-28 DIAGNOSIS — R51 Headache: Secondary | ICD-10-CM

## 2011-05-28 DIAGNOSIS — I10 Essential (primary) hypertension: Secondary | ICD-10-CM

## 2011-05-28 DIAGNOSIS — E119 Type 2 diabetes mellitus without complications: Secondary | ICD-10-CM | POA: Insufficient documentation

## 2011-05-28 DIAGNOSIS — Z79899 Other long term (current) drug therapy: Secondary | ICD-10-CM | POA: Insufficient documentation

## 2011-05-28 DIAGNOSIS — Z794 Long term (current) use of insulin: Secondary | ICD-10-CM | POA: Insufficient documentation

## 2011-05-28 DIAGNOSIS — E0590 Thyrotoxicosis, unspecified without thyrotoxic crisis or storm: Secondary | ICD-10-CM | POA: Insufficient documentation

## 2011-05-28 DIAGNOSIS — R110 Nausea: Secondary | ICD-10-CM | POA: Insufficient documentation

## 2011-05-28 MED ORDER — HYDROCODONE-ACETAMINOPHEN 5-325 MG PO TABS
1.0000 | ORAL_TABLET | Freq: Once | ORAL | Status: AC
  Administered 2011-05-28: 1 via ORAL
  Filled 2011-05-28: qty 1

## 2011-05-28 MED ORDER — HYDROCODONE-ACETAMINOPHEN 5-325 MG PO TABS: 1.0000 | ORAL_TABLET | Freq: Four times a day (QID) | ORAL | Status: AC | PRN

## 2011-05-28 NOTE — ED Notes (Signed)
Patient transported to CT 

## 2011-05-28 NOTE — Discharge Instructions (Signed)
Follow back up with your primary care Dr. Have blood pressures rechecked head CT today negative blood pressure improved with control of headache blood pressure improved to 161/87, you may still require some adjustment of your blood pressure medicine but nothing needs to be done emergently today. Return for new or worse symptoms.

## 2011-05-28 NOTE — ED Notes (Signed)
Seen MD today due to high BP and was told not to go home and to come here, MD increased BP med just today

## 2011-05-28 NOTE — ED Notes (Signed)
Pt states headache. Sent from PMD office for high blood pressure. BP medication recently changed

## 2011-05-28 NOTE — ED Provider Notes (Signed)
History    Scribed for Mervin Kung, MD, the patient was seen in room APA12/APA12. This chart was scribed by Lyndee Hensen.   CSN: [id number]  Arrival date & time 05/28/11  1215   First MD Initiated Contact with Patient 05/28/11 1252      Chief Complaint  Patient presents with  . Hypertension    (Consider location/radiation/quality/duration/timing/severity/associated sxs/prior treatment) HPI   Pt was seen at 1:16 PM   Kathryn Beck is a 56 y.o. female with hypertension  presents to the Emergency Department for moderately elevated blood pressure today.  Patient sent from PCP office for elevated blood pressure.  Patient states she stopped taking Klondine and was placed on 2 new medications for hypertension by PCP.  Episode associated with headache with nausea for past 3 weeks. Symptoms are gradually improving.  Symptoms are not associated with shortness of breath, stroke symptoms, vision changes or vomiting, chest pain, weakness.  Denies rash, back, neck or abdominal pain and dysuria.    Patient has diabetes mellitus and hyperthyroidism.     PCP Tula Nakayama, MD, MD    Past Medical History  Diagnosis Date  . Diabetes mellitus   . Hypertension   . Back pain   . Anemia   . Hyperthyroidism   . Obesity   . Sleep apnea   . Breast mass, left     Past Surgical History  Procedure Date  . Abdominal hysterectomy   . Lung biopsy   . Foot surgery     bilateral     No family history on file.  History  Substance Use Topics  . Smoking status: Never Smoker   . Smokeless tobacco: Not on file  . Alcohol Use: No    OB History    Grav Para Term Preterm Abortions TAB SAB Ect Mult Living                  Review of Systems  All other systems reviewed and are negative.   Remaining review of systems negative except as noted in the HPI.  Allergies  Ace inhibitors and Penicillins  Home Medications   Current Outpatient Rx  Name Route Sig Dispense Refill  .  VITAMIN D 1000 UNITS PO TABS Oral Take 1,000 Units by mouth daily.    . INSULIN DETEMIR 100 UNIT/ML [place] SOLN Subcutaneous Inject 30 Units into the skin at bedtime.     . INSULIN LISPRO (HUMAN) 100 UNIT/ML [place] SOLN Subcutaneous Inject 10-15 Units into the skin 3 (three) times daily before meals. Patient uses according to sliding scale at home    . LISINOPRIL 10 MG PO TABS Oral Take 10 mg by mouth daily.    Marland Kitchen VALSARTAN 320 MG PO TABS Oral Take 1 tablet (320 mg total) by mouth daily. 90 tablet 1  . HYDROCODONE-ACETAMINOPHEN 5-325 MG PO TABS Oral Take 1-2 tablets by mouth every 6 (six) hours as needed for pain. 10 tablet 0    BP 161/87  Pulse 70  Temp(Src) 98.3 F (36.8 C) (Oral)  Resp 16  Ht 5\' 6"  (1.676 m)  Wt 203 lb (92.08 kg)  BMI 32.76 kg/m2  SpO2 95%  Physical Exam  Nursing note and vitals reviewed. Constitutional: She is oriented to person, place, and time. She appears well-developed and well-nourished. No distress.  HENT:  Head: Normocephalic and atraumatic.  Eyes: Conjunctivae and EOM are normal.  Neck: Neck supple.  Cardiovascular: Normal rate, regular rhythm and normal heart sounds.   No  murmur heard. Pulmonary/Chest: Effort normal and breath sounds normal. No respiratory distress. She has no wheezes.  Abdominal: Soft. Bowel sounds are normal. There is no tenderness.  Musculoskeletal: Normal range of motion. She exhibits no edema.  Neurological: She is alert and oriented to person, place, and time. No cranial nerve deficit or sensory deficit. Coordination normal.  Skin: Skin is warm and dry. No rash noted.  Psychiatric: She has a normal mood and affect. Her behavior is normal.    ED Course  Procedures (including critical care time)   DIAGNOSTIC STUDIES: Oxygen Saturation is 96% on room air, normal by my interpretation.     COORDINATION OF CARE: 12:29 PM BP  193/107  1:21 PM  Physical exam complete.  Will CT head.    1:30 PM  Norco ordered.    2:33 PM  BP  186/86    LABS / RADIOLOGY:   Labs Reviewed - No data to display Ct Head Wo Contrast  05/28/2011  *RADIOLOGY REPORT*  Clinical Data: 56 year old female with headache.  CT HEAD WITHOUT CONTRAST  Technique:  Contiguous axial images were obtained from the base of the skull through the vertex without contrast.  Comparison: None.  Findings: Visualized paranasal sinuses and mastoids are clear. Hyperostosis of the calvarium. No acute osseous abnormality identified.  Visualized orbits and scalp soft tissues are within normal limits.  Cerebral volume is within normal limits for age.  No midline shift, ventriculomegaly, mass effect, evidence of mass lesion, intracranial hemorrhage or evidence of cortically based acute infarction.  Gray-white matter differentiation is within normal limits throughout the brain.  Partially empty sella suspected. No suspicious intracranial vascular hyperdensity.   Extensive dural calcifications.  IMPRESSION: Normal noncontrast CT appearance of the brain for age.  Original Report Authenticated By: Randall An, M.D.         MDM  Patient referred in for headache for several weeks and hypertension patient has known history of hypertension no severe headache no chest pain no shortness of breath no neurological focal deficits consistent with a stroke. CT negative for any evidence of head bleed blood pressure improved with pain medicine in the emergency department from initial blood pressure 193/107 down to blood pressure 161/87. Patient stable to go home to follow primary care provider for further close followup of the blood pressure if headache persist we'll need MRI of brain.  No evidence of endorgan dysfunction no hypertensive crisis.       MEDICATIONS GIVEN IN THE E.D. Scheduled Meds:    . HYDROcodone-acetaminophen  1 tablet Oral Once   Continuous Infusions:      IMPRESSION: 1. Headache   2. Hypertension      NEW MEDICATIONS: New Prescriptions    HYDROCODONE-ACETAMINOPHEN (NORCO) 5-325 MG PER TABLET    Take 1-2 tablets by mouth every 6 (six) hours as needed for pain.      I personally performed the services described in this documentation, which was scribed in my presence. The recorded information has been reviewed and considered.       Mervin Kung, MD 05/28/11 [phone]

## 2011-06-02 ENCOUNTER — Ambulatory Visit (INDEPENDENT_AMBULATORY_CARE_PROVIDER_SITE_OTHER): Payer: 59 | Admitting: Family Medicine

## 2011-06-02 ENCOUNTER — Encounter: Payer: Self-pay | Admitting: Family Medicine

## 2011-06-02 VITALS — BP 200/110 | HR 77 | Resp 16 | Ht 66.0 in | Wt 203.4 lb

## 2011-06-02 DIAGNOSIS — E669 Obesity, unspecified: Secondary | ICD-10-CM

## 2011-06-02 DIAGNOSIS — G4733 Obstructive sleep apnea (adult) (pediatric): Secondary | ICD-10-CM | POA: Insufficient documentation

## 2011-06-02 DIAGNOSIS — IMO0002 Reserved for concepts with insufficient information to code with codable children: Secondary | ICD-10-CM

## 2011-06-02 DIAGNOSIS — I10 Essential (primary) hypertension: Secondary | ICD-10-CM

## 2011-06-02 DIAGNOSIS — G4730 Sleep apnea, unspecified: Secondary | ICD-10-CM

## 2011-06-02 DIAGNOSIS — E785 Hyperlipidemia, unspecified: Secondary | ICD-10-CM

## 2011-06-02 DIAGNOSIS — N058 Unspecified nephritic syndrome with other morphologic changes: Secondary | ICD-10-CM

## 2011-06-02 DIAGNOSIS — E1129 Type 2 diabetes mellitus with other diabetic kidney complication: Secondary | ICD-10-CM

## 2011-06-02 MED ORDER — AMLODIPINE BESYLATE 10 MG PO TABS: 10.0000 mg | ORAL_TABLET | Freq: Every day | ORAL | Status: DC

## 2011-06-02 NOTE — Patient Instructions (Signed)
F/u in 10 days, 03/21 /2013  New additional medication for blood pressure, start today as soon as you get home, amlodipine, take once daily, at the same time every day.  Continue diovan and lisinopril as before.  Rest, no work, no stress, your blood pressure is still too high.  I have authorized the appt with Dr Merlene Laughter re your sleep apnea, this is very important

## 2011-06-02 NOTE — Assessment & Plan Note (Addendum)
Uncontrolled pt to start new medications

## 2011-06-11 ENCOUNTER — Ambulatory Visit (INDEPENDENT_AMBULATORY_CARE_PROVIDER_SITE_OTHER): Payer: 59 | Admitting: Family Medicine

## 2011-06-11 ENCOUNTER — Encounter: Payer: Self-pay | Admitting: Family Medicine

## 2011-06-11 VITALS — BP 150/80 | HR 96 | Resp 18 | Ht 66.0 in | Wt 205.1 lb

## 2011-06-11 DIAGNOSIS — E785 Hyperlipidemia, unspecified: Secondary | ICD-10-CM

## 2011-06-11 DIAGNOSIS — E669 Obesity, unspecified: Secondary | ICD-10-CM

## 2011-06-11 DIAGNOSIS — I10 Essential (primary) hypertension: Secondary | ICD-10-CM

## 2011-06-11 MED ORDER — TRIAMTERENE-HCTZ 37.5-25 MG PO TABS: 1.0000 | ORAL_TABLET | Freq: Every day | ORAL | Status: DC

## 2011-06-11 NOTE — Assessment & Plan Note (Signed)
Improved, though not at goal. Will d/c ACE currently on and start maxzide

## 2011-06-11 NOTE — Progress Notes (Signed)
  Subjective:    Patient ID: Kathryn Beck, female    DOB: 08-Mar-1956, 56 y.o.   MRN: [id number]  HPI Pt here for re evaluation of uncontrolled hypertension. She denies any intolerance to her meds. Of note she denies cough, swelling or difficulty breathing, no light headedness or palpitations or leg edema. Since in the past she has had an ACE allergy, this will be discontinued   Review of Systems See HPI Denies recent fever or chills. Denies sinus pressure, nasal congestion, ear pain or sore throat. Denies chest congestion, productive cough or wheezing. Denies chest pains, palpitations and leg swelling  Denies skin break down or rash.        Objective:   Physical Exam Patient alert and oriented and in no cardiopulmonary distress.  HEENT: No facial asymmetry, EOMI, no sinus tenderness,  oropharynx pink and moist.  Neck supple no adenopathy.  Chest: Clear to auscultation bilaterally.  CVS: S1, S2 no murmurs, no S3.  ABD: Soft non tender. Bowel sounds normal.  Ext: No edema        Assessment & Plan:

## 2011-06-11 NOTE — Patient Instructions (Signed)
F/u in 4 weeks.  Blood pressure is much better.  STOP LISINOPRIL, due to ACE allergy  NEW is maxzide/triamterene ONE  Daily  Chem 7 in 4 weeks  Blood pressure meds are AMLODIPINE, DIOVAN, and MAXZIDE

## 2011-06-13 NOTE — Assessment & Plan Note (Signed)
Deteriorated. Patient re-educated about  the importance of commitment to a  minimum of 150 minutes of exercise per week. The importance of healthy food choices with portion control discussed. Encouraged to start a food diary, count calories and to consider  joining a support group. Sample diet sheets offered. Goals set by the patient for the next several months.    

## 2011-06-13 NOTE — Assessment & Plan Note (Signed)
Hyperlipidemia:Low fat diet discussed and encouraged.   

## 2011-06-14 NOTE — Assessment & Plan Note (Signed)
Deteriorated. Patient re-educated about  the importance of commitment to a  minimum of 150 minutes of exercise per week. The importance of healthy food choices with portion control discussed. Encouraged to start a food diary, count calories and to consider  joining a support group. Sample diet sheets offered. Goals set by the patient for the next several months.    

## 2011-06-14 NOTE — Progress Notes (Signed)
  Subjective:    Patient ID: Kathryn Beck, female    DOB: [birth year], 56 y.o.   MRN: [id number]  HPI The PT is here for follow up and re-evaluation of chronic medical conditions, medication management and review of any available recent lab and radiology data.  Preventive health is updated, specifically  Cancer screening and Immunization.   Questions or concerns regarding consultations or procedures which the PT has had in the interim are  addressed. The PT denies any adverse reactions to current medications since the last visit.  There are no new concerns. Needs to re connect with sleep specialist There are no specific complaints       Review of Systems See HPI Denies recent fever or chills. Denies sinus pressure, nasal congestion, ear pain or sore throat. Denies chest congestion, productive cough or wheezing. Denies chest pains, palpitations and leg swelling Denies abdominal pain, nausea, vomiting,diarrhea or constipation.   Denies dysuria, frequency, hesitancy or incontinence. Denies joint pain, swelling and limitation in mobility. Denies headaches, seizures, numbness, or tingling. Denies depression, anxiety or insomnia. Denies skin break down or rash.        Objective:   Physical Exam Patient alert and oriented and in no cardiopulmonary distress.  HEENT: No facial asymmetry, EOMI, no sinus tenderness,  oropharynx pink and moist.  Neck supple no adenopathy.  Chest: Clear to auscultation bilaterally.  CVS: S1, S2 no murmurs, no S3.  ABD: Soft non tender. Bowel sounds normal.  Ext: No edema  MS: Adequate ROM spine, shoulders, hips and knees.  Skin: Intact, no ulcerations or rash noted.  Psych: Good eye contact, normal affect. Memory intact not anxious or depressed appearing.  CNS: CN 2-12 intact, power, tone and sensation normal throughout.        Assessment & Plan:

## 2011-06-14 NOTE — Assessment & Plan Note (Signed)
Uncontrolled and followed by endo

## 2011-06-14 NOTE — Assessment & Plan Note (Signed)
Uncontrolled, low fat diet discussed

## 2011-07-10 ENCOUNTER — Ambulatory Visit: Payer: 59 | Admitting: Family Medicine

## 2011-09-09 ENCOUNTER — Other Ambulatory Visit: Payer: Self-pay | Admitting: Family Medicine

## 2011-09-09 DIAGNOSIS — Z139 Encounter for screening, unspecified: Secondary | ICD-10-CM

## 2011-09-14 ENCOUNTER — Ambulatory Visit (HOSPITAL_COMMUNITY): Payer: 59

## 2011-09-17 ENCOUNTER — Ambulatory Visit (HOSPITAL_COMMUNITY)
Admission: RE | Admit: 2011-09-17 | Discharge: 2011-09-17 | Disposition: A | Discharge status: Home / Self Care | Payer: 59 | Source: Ambulatory Visit | Attending: Family Medicine | Admitting: Family Medicine

## 2011-09-17 DIAGNOSIS — Z1231 Encounter for screening mammogram for malignant neoplasm of breast: Secondary | ICD-10-CM | POA: Insufficient documentation

## 2011-09-17 DIAGNOSIS — Z139 Encounter for screening, unspecified: Secondary | ICD-10-CM

## 2011-10-15 ENCOUNTER — Telehealth: Payer: Self-pay | Admitting: Family Medicine

## 2011-10-15 DIAGNOSIS — I10 Essential (primary) hypertension: Secondary | ICD-10-CM

## 2011-10-15 DIAGNOSIS — E0590 Thyrotoxicosis, unspecified without thyrotoxic crisis or storm: Secondary | ICD-10-CM

## 2011-10-15 DIAGNOSIS — E785 Hyperlipidemia, unspecified: Secondary | ICD-10-CM

## 2011-10-15 DIAGNOSIS — IMO0002 Reserved for concepts with insufficient information to code with codable children: Secondary | ICD-10-CM

## 2011-10-15 DIAGNOSIS — R5381 Other malaise: Secondary | ICD-10-CM

## 2011-10-15 DIAGNOSIS — D649 Anemia, unspecified: Secondary | ICD-10-CM

## 2011-10-15 DIAGNOSIS — E1165 Type 2 diabetes mellitus with hyperglycemia: Secondary | ICD-10-CM

## 2011-10-15 NOTE — Telephone Encounter (Signed)
What labs need to be ordered 

## 2011-10-15 NOTE — Telephone Encounter (Signed)
Pt aware and her lab has been faxed in. No labs from dr Dorris Fetch

## 2011-10-15 NOTE — Telephone Encounter (Signed)
Needs cbc, fasting lipid, cmp and EGFR, tSH  ASk her if she has had lab from dr Dorris Fetch in past 3 to 4 months, if not needs HBA1C lso I will send the result to him, pLEASE!

## 2011-10-19 ENCOUNTER — Telehealth: Payer: Self-pay | Admitting: Family Medicine

## 2011-10-19 ENCOUNTER — Other Ambulatory Visit: Payer: Self-pay | Admitting: Family Medicine

## 2011-10-19 LAB — COMPLETE METABOLIC PANEL WITH GFR
ALT: 42 U/L — ABNORMAL HIGH (ref 0–35)
Albumin: 4.6 g/dL (ref 3.5–5.2)
Alkaline Phosphatase: 79 U/L (ref 39–117)
CO2: 26 mEq/L (ref 19–32)
GFR, Est African American: 62 mL/min
Glucose, Bld: 159 mg/dL — ABNORMAL HIGH (ref 70–99)
Potassium: 4.7 mEq/L (ref 3.5–5.3)
Sodium: 139 mEq/L (ref 135–145)
Total Bilirubin: 0.3 mg/dL (ref 0.3–1.2)
Total Protein: 8.1 g/dL (ref 6.0–8.3)

## 2011-10-19 LAB — LIPID PANEL
HDL: 40 mg/dL (ref >39)
LDL Cholesterol: 182 mg/dL — ABNORMAL HIGH (ref 0–99)

## 2011-10-20 LAB — CBC WITH DIFFERENTIAL/PLATELET
Basophils Relative: 1 % (ref 0–1)
Eosinophils Absolute: 0.1 10*3/uL (ref 0.0–0.7)
Eosinophils Relative: 1 % (ref 0–5)
Hemoglobin: 11.6 g/dL — ABNORMAL LOW (ref 12.0–15.0)
Lymphs Abs: 2.7 10*3/uL (ref 0.7–4.0)
MCH: 22.1 pg — ABNORMAL LOW (ref 26.0–34.0)
MCHC: 31.2 g/dL (ref 30.0–36.0)
MCV: 71 fL — ABNORMAL LOW (ref 78.0–100.0)
Monocytes Absolute: 0.3 10*3/uL (ref 0.1–1.0)
Monocytes Relative: 6 % (ref 3–12)
RBC: 5.24 MIL/uL — ABNORMAL HIGH (ref 3.87–5.11)

## 2011-10-21 ENCOUNTER — Ambulatory Visit (INDEPENDENT_AMBULATORY_CARE_PROVIDER_SITE_OTHER): Payer: 59 | Admitting: Family Medicine

## 2011-10-21 ENCOUNTER — Encounter: Payer: Self-pay | Admitting: Family Medicine

## 2011-10-21 VITALS — BP 130/82 | HR 96 | Resp 18 | Ht 66.0 in | Wt 201.0 lb

## 2011-10-21 DIAGNOSIS — N058 Unspecified nephritic syndrome with other morphologic changes: Secondary | ICD-10-CM

## 2011-10-21 DIAGNOSIS — L930 Discoid lupus erythematosus: Secondary | ICD-10-CM

## 2011-10-21 DIAGNOSIS — Z1211 Encounter for screening for malignant neoplasm of colon: Secondary | ICD-10-CM

## 2011-10-21 DIAGNOSIS — I10 Essential (primary) hypertension: Secondary | ICD-10-CM

## 2011-10-21 DIAGNOSIS — E1129 Type 2 diabetes mellitus with other diabetic kidney complication: Secondary | ICD-10-CM

## 2011-10-21 DIAGNOSIS — E785 Hyperlipidemia, unspecified: Secondary | ICD-10-CM

## 2011-10-21 DIAGNOSIS — IMO0002 Reserved for concepts with insufficient information to code with codable children: Secondary | ICD-10-CM

## 2011-10-21 MED ORDER — PRAVASTATIN SODIUM 20 MG PO TABS: 20.0000 mg | ORAL_TABLET | Freq: Every evening | ORAL | Status: DC

## 2011-10-21 NOTE — Patient Instructions (Addendum)
F/u in 3.5 to 4 month  Your recent labs will be faxed to Dr Dorris Fetch  Start pravastain 20mg  one every Monday, Wednesday and Friday for cholesterol (script says take every day, just take 3 times per week)  Hepatic panel, in 6 weeks non fasting  Fasting lipid and hepatic in 3.5 months , just before f/u  Stool test today in office  Blood pressure is excellent  Cholesterol extremely high, need to change to .low fat diet  It is important that you exercise regularly at least 30 minutes 5 times a week. If you develop chest pain, have severe difficulty breathing, or feel very tired, stop exercising immediately and seek medical attention  A healthy diet is rich in fruit, vegetables and whole grains. Poultry fish, nuts and beans are a healthy choice for protein rather then red meat. A low sodium diet and drinking 64 ounces of water daily is generally recommended. Oils and sweet should be limited. Carbohydrates especially for those who are diabetic or overweight, should be limited to 30-45 gram per meal. It is important to eat on a regular schedule, at least 3 times daily. Snacks should be primarily fruits, vegetables or nuts.

## 2011-10-22 LAB — VITAMIN D 25 HYDROXY (VIT D DEFICIENCY, FRACTURES): Vit D, 25-Hydroxy: 27 ng/mL — ABNORMAL LOW (ref 30–89)

## 2011-10-23 NOTE — Telephone Encounter (Signed)
Patient came in office after message left for OV

## 2011-10-25 NOTE — Assessment & Plan Note (Signed)
Controlled, no change in medication DASH diet and commitment to daily physical activity for a minimum of 30 minutes discussed and encouraged, as a part of hypertension management. The importance of attaining a healthy weight is also discussed.  

## 2011-10-25 NOTE — Progress Notes (Signed)
  Subjective:    Patient ID: Kathryn Beck, female    DOB: 04/28/1955, 56 y.o.   MRN: [id number]  HPI The PT is here for follow up and re-evaluation of chronic medical conditions, medication management and review of any available recent lab and radiology data.  Preventive health is updated, specifically  Cancer screening and Immunization.   Questions or concerns regarding consultations or procedures which the PT has had in the interim are  addressed. The PT denies any adverse reactions to current medications since the last visit.  States her skin condition and the heat are giving her a hard time. Has not been diligent with diabetic management and her blood sugar is worse      Review of Systems See HPI Denies recent fever or chills.c/o fatigue with the heat Denies sinus pressure, nasal congestion, ear pain or sore throat. Denies chest congestion, productive cough or wheezing. Denies chest pains, palpitations and leg swelling Denies abdominal pain, nausea, vomiting,diarrhea or constipation.   Denies dysuria, frequency, hesitancy or incontinence. Denies joint pain, swelling and limitation in mobility. Denies headaches, seizures, numbness, or tingling. Denies depression, or insomnia.Has anxiety over her health        Objective:   Physical Exam Patient alert and oriented and in no cardiopulmonary distress.  HEENT: No facial asymmetry, EOMI, no sinus tenderness,  oropharynx pink and moist.  Neck supple no adenopathy.  Chest: Clear to auscultation bilaterally.  CVS: S1, S2 no murmurs, no S3.  ABD: Soft non tender. Bowel sounds normal.  Ext: No edema  MS: Adequate ROM spine, shoulders, hips and knees.  Skin: Intact, no ulcerations or rash noted.  Psych: Good eye contact, normal affect. Memory intact  Mildly  anxious not  depressed appearing.  CNS: CN 2-12 intact, power, tone and sensation normal throughout.        Assessment & Plan:

## 2011-10-25 NOTE — Assessment & Plan Note (Signed)
Reports Summer flare of fatigue and rash, is being referred back to a tertiary center by local derm

## 2011-10-25 NOTE — Assessment & Plan Note (Signed)
Deteriorated, pt encouraged to test regualrly, comply with medication and treatment plan and return to endo

## 2011-10-25 NOTE — Assessment & Plan Note (Addendum)
Uncontrolled.Pt to resume pravastatin 3 to 4 times weekly close f/u of heapatic panel  Hyperlipidemia:Low fat diet discussed and encouraged.

## 2011-12-01 ENCOUNTER — Ambulatory Visit (INDEPENDENT_AMBULATORY_CARE_PROVIDER_SITE_OTHER): Payer: 59 | Admitting: Family Medicine

## 2011-12-01 ENCOUNTER — Encounter: Payer: Self-pay | Admitting: Family Medicine

## 2011-12-01 VITALS — BP 148/82 | HR 97 | Resp 16 | Ht 66.0 in | Wt 202.1 lb

## 2011-12-01 DIAGNOSIS — J40 Bronchitis, not specified as acute or chronic: Secondary | ICD-10-CM | POA: Insufficient documentation

## 2011-12-01 DIAGNOSIS — E1165 Type 2 diabetes mellitus with hyperglycemia: Secondary | ICD-10-CM

## 2011-12-01 DIAGNOSIS — I10 Essential (primary) hypertension: Secondary | ICD-10-CM

## 2011-12-01 DIAGNOSIS — R5381 Other malaise: Secondary | ICD-10-CM

## 2011-12-01 DIAGNOSIS — IMO0002 Reserved for concepts with insufficient information to code with codable children: Secondary | ICD-10-CM

## 2011-12-01 DIAGNOSIS — R5383 Other fatigue: Secondary | ICD-10-CM

## 2011-12-01 DIAGNOSIS — E1129 Type 2 diabetes mellitus with other diabetic kidney complication: Secondary | ICD-10-CM

## 2011-12-01 DIAGNOSIS — E785 Hyperlipidemia, unspecified: Secondary | ICD-10-CM

## 2011-12-01 DIAGNOSIS — J019 Acute sinusitis, unspecified: Secondary | ICD-10-CM

## 2011-12-01 DIAGNOSIS — E669 Obesity, unspecified: Secondary | ICD-10-CM

## 2011-12-01 DIAGNOSIS — N058 Unspecified nephritic syndrome with other morphologic changes: Secondary | ICD-10-CM

## 2011-12-01 MED ORDER — SULFAMETHOXAZOLE-TRIMETHOPRIM 800-160 MG PO TABS: 1.0000 | ORAL_TABLET | Freq: Two times a day (BID) | ORAL | Status: AC

## 2011-12-01 MED ORDER — PROMETHAZINE-DM 6.25-15 MG/5ML PO SYRP: ORAL_SOLUTION | ORAL | Status: AC

## 2011-12-01 MED ORDER — BENZONATATE 100 MG PO CAPS: 100.0000 mg | ORAL_CAPSULE | Freq: Three times a day (TID) | ORAL | Status: AC | PRN

## 2011-12-01 NOTE — Patient Instructions (Addendum)
F/u in December.  Please call if you need me before.  Hepatic panel today  Call for flu vaccine in 3 to 4 weeks Medication is sent in for sinusitis and bronchitis, also a cough suppressant.  Please keep appointment  with Dr. Dorris Fetch and ensure you have an eye exam every year.  I am concerned that your blood pressure is still to high, goal is 130/80 or less, please call for sooner appointment if you agree  That you actually do need more medication to get the blood pressure at goal where it should be  Fasting lipid, cmp and EGFR in December before follow up visit

## 2011-12-01 NOTE — Progress Notes (Signed)
  Subjective:    Patient ID: Kathryn Beck, female    DOB: [birth year], 56 y.o.   MRN: [id number]  HPI 2 week h/o head and chest congestion, should have come last week, started to feel better, did not keep appt but is here today. Has also been having back ache with cough. Blood sugars remain uncontrolled, however she is being followed by endo fo this    Review of Systems See HPI' .Denies chest pains, palpitations and leg swelling Denies abdominal pain, nausea, vomiting,diarrhea or constipation.   Denies dysuria, frequency, hesitancy or incontinence. Denies joint pain, swelling and limitation in mobility. Denies headaches, seizures, numbness, or tingling. Denies depression, anxiety or insomnia. Denies skin break down or rash.        Objective:   Physical Exam  Patient alert and oriented and in no cardiopulmonary distress.  HEENT: No facial asymmetry, EOMI, frontal and maxillary  sinus tenderness,  oropharynx pink and moist.  Neck supple no adenopathy.  Chest: decreased air entry, scattered crackles, no wheezes CVS: S1, S2 no murmurs, no S3.  ABD: Soft non tender. Bowel sounds normal.  Ext: No edema  MS: Adequate ROM spine, shoulders, hips and knees.  Skin: Intact, no ulcerations or rash noted.  Psych: Good eye contact, normal affect. Memory intact not anxious or depressed appearing.  CNS: CN 2-12 intact, power, tone and sensation normal throughout.       Assessment & Plan:

## 2011-12-02 LAB — HEPATIC FUNCTION PANEL
Albumin: 4.4 g/dL (ref 3.5–5.2)
Total Bilirubin: 0.3 mg/dL (ref 0.3–1.2)
Total Protein: 8.4 g/dL — ABNORMAL HIGH (ref 6.0–8.3)

## 2011-12-21 DIAGNOSIS — J0100 Acute maxillary sinusitis, unspecified: Secondary | ICD-10-CM | POA: Insufficient documentation

## 2011-12-21 DIAGNOSIS — J0140 Acute pansinusitis, unspecified: Secondary | ICD-10-CM | POA: Insufficient documentation

## 2011-12-21 NOTE — Assessment & Plan Note (Signed)
Antibiotics prescribed 

## 2011-12-21 NOTE — Assessment & Plan Note (Signed)
Hyperlipidemia:Low fat diet discussed and encouraged.  Uncontrolled needs to take med

## 2011-12-21 NOTE — Assessment & Plan Note (Signed)
Deteriorated. Patient re-educated about  the importance of commitment to a  minimum of 150 minutes of exercise per week. The importance of healthy food choices with portion control discussed. Encouraged to start a food diary, count calories and to consider  joining a support group. Sample diet sheets offered. Goals set by the patient for the next several months.    

## 2011-12-21 NOTE — Assessment & Plan Note (Signed)
Acute infection antibiotic prescribed and decongestants

## 2011-12-21 NOTE — Assessment & Plan Note (Signed)
Uncontrolled, dose increase in meds  DASH diet and commitment to daily physical activity for a minimum of 30 minutes discussed and encouraged, as a part of hypertension management. The importance of attaining a healthy weight is also discussed.

## 2012-01-26 ENCOUNTER — Ambulatory Visit: Payer: 59 | Admitting: Family Medicine

## 2012-03-01 LAB — COMPLETE METABOLIC PANEL WITH GFR
ALT: 26 U/L (ref 0–35)
AST: 26 U/L (ref 0–37)
CO2: 27 mEq/L (ref 19–32)
Chloride: 102 mEq/L (ref 96–112)
Creat: 1.09 mg/dL (ref 0.50–1.10)
Sodium: 136 mEq/L (ref 135–145)
Total Bilirubin: 0.3 mg/dL (ref 0.3–1.2)
Total Protein: 7.4 g/dL (ref 6.0–8.3)

## 2012-03-01 LAB — LIPID PANEL
HDL: 41 mg/dL (ref >39)
LDL Cholesterol: 152 mg/dL — ABNORMAL HIGH (ref 0–99)
Triglycerides: 203 mg/dL — ABNORMAL HIGH (ref <150)
VLDL: 41 mg/dL — ABNORMAL HIGH (ref 0–40)

## 2012-03-02 ENCOUNTER — Encounter: Payer: Self-pay | Admitting: Family Medicine

## 2012-03-02 ENCOUNTER — Ambulatory Visit (INDEPENDENT_AMBULATORY_CARE_PROVIDER_SITE_OTHER): Payer: 59 | Admitting: Family Medicine

## 2012-03-02 VITALS — BP 160/92 | HR 106 | Resp 18 | Ht 66.0 in | Wt 202.0 lb

## 2012-03-02 DIAGNOSIS — D638 Anemia in other chronic diseases classified elsewhere: Secondary | ICD-10-CM

## 2012-03-02 DIAGNOSIS — E1129 Type 2 diabetes mellitus with other diabetic kidney complication: Secondary | ICD-10-CM

## 2012-03-02 DIAGNOSIS — E785 Hyperlipidemia, unspecified: Secondary | ICD-10-CM

## 2012-03-02 DIAGNOSIS — E669 Obesity, unspecified: Secondary | ICD-10-CM

## 2012-03-02 DIAGNOSIS — IMO0002 Reserved for concepts with insufficient information to code with codable children: Secondary | ICD-10-CM

## 2012-03-02 DIAGNOSIS — L930 Discoid lupus erythematosus: Secondary | ICD-10-CM

## 2012-03-02 DIAGNOSIS — I10 Essential (primary) hypertension: Secondary | ICD-10-CM

## 2012-03-02 DIAGNOSIS — N058 Unspecified nephritic syndrome with other morphologic changes: Secondary | ICD-10-CM

## 2012-03-02 DIAGNOSIS — E1165 Type 2 diabetes mellitus with hyperglycemia: Secondary | ICD-10-CM

## 2012-03-02 MED ORDER — TRIAMTERENE-HCTZ 75-50 MG PO TABS: 1.0000 | ORAL_TABLET | Freq: Every day | ORAL | Status: DC

## 2012-03-02 NOTE — Patient Instructions (Addendum)
F/u in 6 to 8 weeks.  STOP furosemide please.  Blood pressure is high, NEW DOSE MAXZIDE is 50mg  once daily, please take TWO 25 mg maxzide tablets once daily till done  Cholesterol is much better and liver is fine, increase to FOUR pravastatin tablets per week, from 2. Follow low fat diet  Chem 7 non fasting before next visit  Flu shot today at the pharmacy  It is important that you exercise regularly at least 30 minutes 5 times a week. If you develop chest pain, have severe difficulty breathing, or feel very tired, stop exercising immediately and seek medical attention   A healthy diet is rich in fruit, vegetables and whole grains. Poultry fish, nuts and beans are a healthy choice for protein rather then red meat. A low sodium diet and drinking 64 ounces of water daily is generally recommended. Oils and sweet should be limited. Carbohydrates especially for those who are diabetic or overweight, should be limited to 30-45 gram per meal. It is important to eat on a regular schedule, at least 3 times daily. Snacks should be primarily fruits, vegetables or nuts.

## 2012-03-02 NOTE — Progress Notes (Signed)
  Subjective:    Patient ID: Kathryn Beck, female    DOB: 1955/08/07, 56 y.o.   MRN: [id number]  HPI The PT is here for follow up and re-evaluation of chronic medical conditions, medication management and review of any available recent lab and radiology data.  Preventive health is updated, specifically  Cancer screening and Immunization.   Questions or concerns regarding consultations or procedures which the PT has had in the interim are  addressed. The PT denies any adverse reactions to current medications since the last visit.  There are no new concerns.  There are no specific complaints       Review of Systems See HPI Denies recent fever or chills. Denies sinus pressure, nasal congestion, ear pain or sore throat. Denies chest congestion, productive cough or wheezing. Denies chest pains, palpitations and leg swelling Denies abdominal pain, nausea, vomiting,diarrhea or constipation.   Denies dysuria, frequency, hesitancy or incontinence. Denies joint pain, swelling and limitation in mobility. Denies headaches, seizures, numbness, or tingling. Denies depression, anxiety or insomnia. Denies skin break down or rash.        Objective:   Physical Exam Patient alert and oriented and in no cardiopulmonary distress.  HEENT: No facial asymmetry, EOMI, no sinus tenderness,  oropharynx pink and moist.  Neck supple no adenopathy.  Chest: Clear to auscultation bilaterally.  CVS: S1, S2 no murmurs, no S3.  ABD: Soft non tender. Bowel sounds normal.  Ext: No edema  MS: Adequate ROM spine, shoulders, hips and knees.  Skin: Intact, no ulcerations or rash noted.  Psych: Good eye contact, normal affect. Memory intact not anxious or depressed appearing.  CNS: CN 2-12 intact, power, tone and sensation normal throughout.        Assessment & Plan:

## 2012-03-04 MED ORDER — VALSARTAN 320 MG PO TABS: 320.0000 mg | ORAL_TABLET | Freq: Every day | ORAL | Status: DC

## 2012-03-06 NOTE — Assessment & Plan Note (Signed)
Uncontrolled, dose increase in maxzide DASH diet and commitment to daily physical activity for a minimum of 30 minutes discussed and encouraged, as a part of hypertension management. The importance of attaining a healthy weight is also discussed.  

## 2012-03-06 NOTE — Assessment & Plan Note (Signed)
No acute flare currently

## 2012-03-06 NOTE — Assessment & Plan Note (Signed)
Improved, dose increase in pravachol Hyperlipidemia:Low fat diet discussed and encouraged.

## 2012-03-06 NOTE — Assessment & Plan Note (Signed)
Followed by endo, blood sugar continues to fluctuate, but improved

## 2012-03-06 NOTE — Assessment & Plan Note (Signed)
Unchanged. Patient re-educated about  the importance of commitment to a  minimum of 150 minutes of exercise per week. The importance of healthy food choices with portion control discussed. Encouraged to start a food diary, count calories and to consider  joining a support group. Sample diet sheets offered. Goals set by the patient for the next several months.    

## 2012-03-06 NOTE — Assessment & Plan Note (Signed)
Chronic kidney disease likely te main contributor

## 2012-03-08 ENCOUNTER — Other Ambulatory Visit: Payer: Self-pay

## 2012-03-08 ENCOUNTER — Other Ambulatory Visit: Payer: Self-pay | Admitting: Family Medicine

## 2012-04-05 ENCOUNTER — Telehealth: Payer: Self-pay | Admitting: Family Medicine

## 2012-04-05 NOTE — Telephone Encounter (Signed)
pls let pt know labs from Dr Dorris Fetch show cholesterol tOTALLY out of control. She nEEDS to take the pravachol. 20 mg is on file , I would like to increase the dose to 40mg , and she needs to redcue fried and fatty foods, red meat pls let me know her response so I can send in the order Cholesterol is 265 and LDL 178, pls let her knwo

## 2012-04-06 NOTE — Telephone Encounter (Signed)
I have tried to contact this pt and all numbers have been disconnected. Will await return phonecall

## 2012-04-07 NOTE — Telephone Encounter (Signed)
Mailed letter to contact office

## 2012-04-11 ENCOUNTER — Telehealth: Payer: Self-pay

## 2012-04-11 ENCOUNTER — Other Ambulatory Visit: Payer: Self-pay

## 2012-04-11 DIAGNOSIS — E785 Hyperlipidemia, unspecified: Secondary | ICD-10-CM

## 2012-04-11 MED ORDER — PRAVASTATIN SODIUM 40 MG PO TABS: 40.0000 mg | ORAL_TABLET | Freq: Every evening | ORAL | Status: DC

## 2012-04-11 NOTE — Telephone Encounter (Signed)
Patient aware.

## 2012-04-11 NOTE — Telephone Encounter (Signed)
Liver enz are normal from July to December, should take the pravastatin daily unless having bad s/e like muscle pains, then she should call back and let me know

## 2012-04-27 ENCOUNTER — Ambulatory Visit: Payer: 59 | Admitting: Family Medicine

## 2012-05-13 LAB — BASIC METABOLIC PANEL
CO2: 26 mEq/L (ref 19–32)
Chloride: 101 mEq/L (ref 96–112)
Potassium: 4.5 mEq/L (ref 3.5–5.3)
Sodium: 139 mEq/L (ref 135–145)

## 2012-05-15 ENCOUNTER — Encounter (HOSPITAL_COMMUNITY): Payer: Self-pay | Admitting: Emergency Medicine

## 2012-05-15 ENCOUNTER — Emergency Department (HOSPITAL_COMMUNITY)
Admission: EM | Admit: 2012-05-15 | Discharge: 2012-05-15 | Disposition: A | Discharge status: Home / Self Care | Payer: 59 | Attending: Emergency Medicine | Admitting: Emergency Medicine

## 2012-05-15 ENCOUNTER — Emergency Department (HOSPITAL_COMMUNITY): Payer: 59

## 2012-05-15 DIAGNOSIS — Z8701 Personal history of pneumonia (recurrent): Secondary | ICD-10-CM | POA: Insufficient documentation

## 2012-05-15 DIAGNOSIS — Z79899 Other long term (current) drug therapy: Secondary | ICD-10-CM | POA: Insufficient documentation

## 2012-05-15 DIAGNOSIS — J40 Bronchitis, not specified as acute or chronic: Secondary | ICD-10-CM

## 2012-05-15 DIAGNOSIS — R079 Chest pain, unspecified: Secondary | ICD-10-CM | POA: Insufficient documentation

## 2012-05-15 DIAGNOSIS — I10 Essential (primary) hypertension: Secondary | ICD-10-CM | POA: Insufficient documentation

## 2012-05-15 DIAGNOSIS — E119 Type 2 diabetes mellitus without complications: Secondary | ICD-10-CM | POA: Insufficient documentation

## 2012-05-15 DIAGNOSIS — IMO0001 Reserved for inherently not codable concepts without codable children: Secondary | ICD-10-CM | POA: Insufficient documentation

## 2012-05-15 DIAGNOSIS — Z794 Long term (current) use of insulin: Secondary | ICD-10-CM | POA: Insufficient documentation

## 2012-05-15 DIAGNOSIS — M545 Low back pain, unspecified: Secondary | ICD-10-CM | POA: Insufficient documentation

## 2012-05-15 DIAGNOSIS — R059 Cough, unspecified: Secondary | ICD-10-CM | POA: Insufficient documentation

## 2012-05-15 DIAGNOSIS — Z8742 Personal history of other diseases of the female genital tract: Secondary | ICD-10-CM | POA: Insufficient documentation

## 2012-05-15 DIAGNOSIS — Z862 Personal history of diseases of the blood and blood-forming organs and certain disorders involving the immune mechanism: Secondary | ICD-10-CM | POA: Insufficient documentation

## 2012-05-15 DIAGNOSIS — E0590 Thyrotoxicosis, unspecified without thyrotoxic crisis or storm: Secondary | ICD-10-CM | POA: Insufficient documentation

## 2012-05-15 DIAGNOSIS — E669 Obesity, unspecified: Secondary | ICD-10-CM | POA: Insufficient documentation

## 2012-05-15 LAB — CBC WITH DIFFERENTIAL/PLATELET
Basophils Relative: 0 % (ref 0–1)
Eosinophils Relative: 2 % (ref 0–5)
Hemoglobin: 11.3 g/dL — ABNORMAL LOW (ref 12.0–15.0)
Lymphs Abs: 3.4 10*3/uL (ref 0.7–4.0)
MCH: 22.3 pg — ABNORMAL LOW (ref 26.0–34.0)
MCV: 69.2 fL — ABNORMAL LOW (ref 78.0–100.0)
Monocytes Absolute: 0.5 10*3/uL (ref 0.1–1.0)
RBC: 5.07 MIL/uL (ref 3.87–5.11)

## 2012-05-15 LAB — COMPREHENSIVE METABOLIC PANEL
ALT: 31 U/L (ref 0–35)
CO2: 25 mEq/L (ref 19–32)
Calcium: 10.3 mg/dL (ref 8.4–10.5)
GFR calc Af Amer: 67 mL/min — ABNORMAL LOW (ref >90)
GFR calc non Af Amer: 58 mL/min — ABNORMAL LOW (ref >90)
Glucose, Bld: 201 mg/dL — ABNORMAL HIGH (ref 70–99)
Sodium: 139 mEq/L (ref 135–145)
Total Bilirubin: 0.2 mg/dL — ABNORMAL LOW (ref 0.3–1.2)

## 2012-05-15 MED ORDER — AZITHROMYCIN 250 MG PO TABS: ORAL_TABLET | ORAL | Status: DC

## 2012-05-15 MED ORDER — OXYCODONE-ACETAMINOPHEN 5-325 MG PO TABS: 2.0000 | ORAL_TABLET | ORAL | Status: DC | PRN

## 2012-05-15 MED ORDER — ALBUTEROL SULFATE HFA 108 (90 BASE) MCG/ACT IN AERS: 1.0000 | INHALATION_SPRAY | Freq: Four times a day (QID) | RESPIRATORY_TRACT | Status: DC | PRN

## 2012-05-15 NOTE — ED Notes (Signed)
Pt requesting prescription for pain medication.  Notified Dr. Lacinda Axon.

## 2012-05-15 NOTE — ED Notes (Signed)
Patient with no complaints at this time. Respirations even and unlabored. Skin warm/dry. Discharge instructions reviewed with patient at this time. Patient given opportunity to voice concerns/ask questions. IV removed per policy and band-aid applied to site. Patient discharged at this time and left Emergency Department with steady gait.  

## 2012-05-15 NOTE — ED Notes (Signed)
States that she has been feeling short of breath for several weeks, states today she is having shortness of breath with exertion and chest pain with nausea.  States that it feels like pneumonia to her, similar to what she had in the past.

## 2012-05-15 NOTE — ED Provider Notes (Signed)
History    This chart was scribed for Nat Christen, MD by Marin Comment, ED Scribe. The patient was seen in room APA17/APA17. Patient's care was started at 1046.    CSN: [id number]  Arrival date & time 05/15/12  1028   First MD Initiated Contact with Patient 05/15/12 1046      Chief Complaint  Patient presents with  . Shortness of Breath  . Chest Pain    The history is provided by the patient.   Kathryn Beck is a 57 y.o. female who presents to the Emergency Department complaining of constant, gradually worsening, moderate generalized myalgias that started 2 weeks ago. She reports associated left sided chest pain, lower back pain, productive cough with phlegm and SOB that worsened last night. She has been taking a medication for her cough that Dr. Moshe Cipro prescribed 2 months ago. She reports a h/o pneumonia years ago and states her symptoms today feel similar. She denies any cardiac hx and denies smoking. She has had a flu vaccination.   PCP: Dr. Moshe Cipro   Past Medical History  Diagnosis Date  . Diabetes mellitus   . Hypertension   . Back pain   . Anemia   . Hyperthyroidism   . Obesity   . Sleep apnea   . Breast mass, left     Past Surgical History  Procedure Laterality Date  . Abdominal hysterectomy    . Lung biopsy    . Foot surgery      bilateral     No family history on file.  History  Substance Use Topics  . Smoking status: Never Smoker   . Smokeless tobacco: Not on file  . Alcohol Use: No    OB History   Grav Para Term Preterm Abortions TAB SAB Ect Mult Living                  Review of Systems  Respiratory: Positive for cough and shortness of breath.   Cardiovascular: Positive for chest pain.  Musculoskeletal: Positive for myalgias and back pain.  All other systems reviewed and are negative.    Allergies  Ace inhibitors and Penicillins  Home Medications   Current Outpatient Rx  Name  Route  Sig  Dispense  Refill  . amLODipine  (NORVASC) 10 MG tablet   Oral   Take 1 tablet (10 mg total) by mouth daily.   30 tablet   11   . cyclobenzaprine (FLEXERIL) 10 MG tablet   Oral   Take 10 mg by mouth at bedtime as needed.         Marland Kitchen HYDROcodone-acetaminophen (VICODIN) 5-500 MG per tablet   Oral   Take 1 tablet by mouth daily as needed.         . insulin detemir (LEVEMIR) 100 UNIT/ML injection   Subcutaneous   Inject 40 Units into the skin at bedtime.          . insulin lispro (HUMALOG) 100 UNIT/ML injection   Subcutaneous   Inject 10-15 Units into the skin 3 (three) times daily before meals. Patient uses according to sliding scale at home         . pravastatin (PRAVACHOL) 40 MG tablet   Oral   Take 1 tablet (40 mg total) by mouth every evening.   30 tablet   11     Dose Increase.   . triamterene-hydrochlorothiazide (MAXZIDE) 75-50 MG per tablet   Oral   Take 1 tablet by mouth daily.  30 tablet   11     Dose increase effective 03/02/2012. Discontinue m ...   . valsartan (DIOVAN) 320 MG tablet   Oral   Take 1 tablet (320 mg total) by mouth daily.   90 tablet   1   . Vitamin D, Ergocalciferol, (DRISDOL) 50000 UNITS CAPS   Oral   Take 50,000 Units by mouth every 7 (seven) days.           BP 185/77  Pulse 93  Temp(Src) 98 F (36.7 C) (Oral)  Resp 20  Ht 5\' 8"  (1.727 m)  Wt 204 lb (92.534 kg)  BMI 31.03 kg/m2  SpO2 95%  Physical Exam  Nursing note and vitals reviewed. Constitutional: She is oriented to person, place, and time. She appears well-developed and well-nourished.  HENT:  Head: Normocephalic and atraumatic.  Eyes: Conjunctivae and EOM are normal. Pupils are equal, round, and reactive to light.  Neck: Normal range of motion. Neck supple.  Cardiovascular: Normal rate, regular rhythm and normal heart sounds.   Pulmonary/Chest: Effort normal and breath sounds normal.  Abdominal: Soft. Bowel sounds are normal.  Musculoskeletal: Normal range of motion.  Neurological: She  is alert and oriented to person, place, and time.  Skin: Skin is warm and dry.  Psychiatric: She has a normal mood and affect.    ED Course  Procedures (including critical care time)  DIAGNOSTIC STUDIES: Oxygen Saturation is 95% on room air, adequate by my interpretation.    COORDINATION OF CARE:  11:20-Discussed planned course of treatment with the patient including a chest x-ray, CBC with diff and CMP, who is agreeable at this time.    Results for orders placed during the hospital encounter of 05/15/12  CBC WITH DIFFERENTIAL      Result Value Range   WBC 6.7  4.0 - 10.5 K/uL   RBC 5.07  3.87 - 5.11 MIL/uL   Hemoglobin 11.3 (*) 12.0 - 15.0 g/dL   HCT 35.1 (*) 36.0 - 46.0 %   MCV 69.2 (*) 78.0 - 100.0 fL   MCH 22.3 (*) 26.0 - 34.0 pg   MCHC 32.2  30.0 - 36.0 g/dL   RDW 16.0 (*) 11.5 - 15.5 %   Platelets 316  150 - 400 K/uL   Neutrophils Relative 40 (*) 43 - 77 %   Lymphocytes Relative 51 (*) 12 - 46 %   Monocytes Relative 7  3 - 12 %   Eosinophils Relative 2  0 - 5 %   Basophils Relative 0  0 - 1 %   Neutro Abs 2.7  1.7 - 7.7 K/uL   Lymphs Abs 3.4  0.7 - 4.0 K/uL   Monocytes Absolute 0.5  0.1 - 1.0 K/uL   Eosinophils Absolute 0.1  0.0 - 0.7 K/uL   Basophils Absolute 0.0  0.0 - 0.1 K/uL   RBC Morphology MICROCYTES    COMPREHENSIVE METABOLIC PANEL      Result Value Range   Sodium 139  135 - 145 mEq/L   Potassium 3.8  3.5 - 5.1 mEq/L   Chloride 99  96 - 112 mEq/L   CO2 25  19 - 32 mEq/L   Glucose, Bld 201 (*) 70 - 99 mg/dL   BUN 22  6 - 23 mg/dL   Creatinine, Ser 1.05  0.50 - 1.10 mg/dL   Calcium 10.3  8.4 - 10.5 mg/dL   Total Protein 7.8  6.0 - 8.3 g/dL   Albumin 3.7  3.5 - 5.2 g/dL  AST 25  0 - 37 U/L   ALT 31  0 - 35 U/L   Alkaline Phosphatase 89  39 - 117 U/L   Total Bilirubin 0.2 (*) 0.3 - 1.2 mg/dL   GFR calc non Af Amer 58 (*) >90 mL/min   GFR calc Af Amer 67 (*) >90 mL/min  TROPONIN I      Result Value Range   Troponin I <0.30  <0.30 ng/mL    Dg  Chest 2 View  05/15/2012  *RADIOLOGY REPORT*  Clinical Data: Shortness of breath and chest pain.  CHEST - 2 VIEW  Comparison: 04/20/2008  Findings: Chronic bibasilar scarring present. No evidence of infiltrate, edema or pleural fluid.  Heart size is within normal limits.  Bony thorax shows no significant abnormalities.  IMPRESSION: No active disease.  Chronic bibasilar scarring.   Original Report Authenticated By: Aletta Edouard, M.D.      No diagnosis found.   Date: 05/15/2012  Rate: 88  Rhythm: normal sinus rhythm  QRS Axis: normal  Intervals: normal  ST/T Wave abnormalities: normal  Conduction Disutrbances: none  Narrative Interpretation: unremarkable     MDM  Patient has had cough, productive sputum, with associated dyspnea.   She states it feels like when she had pneumonia in the past. Physical exam was normal. Oxygenation normal. Rx Zithromax and albuterol inhaler. She has primary care follow up     I personally performed the services described in this documentation, which was scribed in my presence. The recorded information has been reviewed and is accurate.     Nat Christen, MD 05/15/12 [phone]

## 2012-05-16 ENCOUNTER — Encounter: Payer: Self-pay | Admitting: Family Medicine

## 2012-05-16 ENCOUNTER — Ambulatory Visit (INDEPENDENT_AMBULATORY_CARE_PROVIDER_SITE_OTHER): Payer: 59 | Admitting: Family Medicine

## 2012-05-16 VITALS — BP 130/84 | HR 90 | Temp 98.5°F | Resp 16 | Ht 66.0 in | Wt 208.0 lb

## 2012-05-16 DIAGNOSIS — IMO0001 Reserved for inherently not codable concepts without codable children: Secondary | ICD-10-CM | POA: Insufficient documentation

## 2012-05-16 DIAGNOSIS — I10 Essential (primary) hypertension: Secondary | ICD-10-CM

## 2012-05-16 DIAGNOSIS — E0590 Thyrotoxicosis, unspecified without thyrotoxic crisis or storm: Secondary | ICD-10-CM

## 2012-05-16 DIAGNOSIS — N058 Unspecified nephritic syndrome with other morphologic changes: Secondary | ICD-10-CM

## 2012-05-16 DIAGNOSIS — E669 Obesity, unspecified: Secondary | ICD-10-CM

## 2012-05-16 DIAGNOSIS — E785 Hyperlipidemia, unspecified: Secondary | ICD-10-CM

## 2012-05-16 DIAGNOSIS — IMO0002 Reserved for concepts with insufficient information to code with codable children: Secondary | ICD-10-CM

## 2012-05-16 DIAGNOSIS — N189 Chronic kidney disease, unspecified: Secondary | ICD-10-CM

## 2012-05-16 DIAGNOSIS — E1129 Type 2 diabetes mellitus with other diabetic kidney complication: Secondary | ICD-10-CM

## 2012-05-16 DIAGNOSIS — J111 Influenza due to unidentified influenza virus with other respiratory manifestations: Secondary | ICD-10-CM

## 2012-05-16 MED ORDER — METHYLPREDNISOLONE ACETATE 80 MG/ML IJ SUSP
80.0000 mg | Freq: Once | INTRAMUSCULAR | Status: AC
  Administered 2012-05-16: 80 mg via INTRAMUSCULAR

## 2012-05-16 MED ORDER — PREDNISONE 5 MG PO TABS: 5.0000 mg | ORAL_TABLET | Freq: Two times a day (BID) | ORAL | Status: DC

## 2012-05-16 MED ORDER — KETOROLAC TROMETHAMINE 60 MG/2ML IM SOLN
60.0000 mg | Freq: Once | INTRAMUSCULAR | Status: AC
  Administered 2012-05-16: 60 mg via INTRAMUSCULAR

## 2012-05-16 MED ORDER — OSELTAMIVIR PHOSPHATE 75 MG PO CAPS: 75.0000 mg | ORAL_CAPSULE | Freq: Two times a day (BID) | ORAL | Status: DC

## 2012-05-16 NOTE — Patient Instructions (Addendum)
F/u in May, call if you need me before  It is important that you exercise regularly at least 30 minutes 5 times a week. If you develop chest pain, have severe difficulty breathing, or feel very tired, stop exercising immediately and seek medical attention    Toradol 60mg  and depo medrol 80mg  in office for generalized aches.  Prednisone sent to pharmacy for 5 days also tamiflu, complete antibiotics from the ED  CPK and CKMB , muscle enzymes will be added to test from Ed since you are having a lot of muscle pain   Fasting lipid, cmp and EGFr for May visit.  Pls bring all medication to next visit

## 2012-05-16 NOTE — Progress Notes (Signed)
  Subjective:    Patient ID: Kathryn Beck, female    DOB: 05-Jul-1955, 57 y.o.   MRN: [id number]  HPI Pt reports that 3 weeks ago she started having left lower back pain intermittentlly, states since last week Monday 1 week ago started having shortness of break, weak, chills and generalized pain. approx 10 day h/o cough productive of cream phlegm, clear nasal drainage which is chronic, but increased amt, intermittent sore throat , no ear pain  Feels as though she may have the flu with generalized aches, chills sore throat, had flu vaccine approx 2 weeks ago. Bad heartburn in the past  has used PPI in the past wants this back  Review of Systems See HPI  Denies chest pains, palpitations and leg swelling Denies nausea, vomiting,diarrhea or constipation.   Denies dysuria, frequency, hesitancy or incontinence. Denies joint pain, swelling and limitation in mobility. Denies headaches, seizures, numbness, or tingling. Denies depression, anxiety or insomnia. Denies skin break down or rash.        Objective:   Physical Exam  Patient alert and oriented and in no cardiopulmonary distress.Ill appearing  HEENT: No facial asymmetry, EOMI, frontal and maxillary  sinus tenderness,  oropharynx erythematous and moist.no exudate  Neck supple bilateral anterior cervical  adenopathy.  Chest: adequate air entry, few crackles no wheezes  CVS: S1, S2 no murmurs, no S3.  ABD: Soft non tender. Bowel sounds normal.  Ext: No edema  MS: Adequate ROM spine, shoulders, hips and knees.Tender to papation over large muscles of back   Skin: Intact, no ulcerations or rash noted.  Psych: Good eye contact, normal affect. Memory intact not anxious or depressed appearing.  CNS: CN 2-12 intact, power, tone and sensation normal throughout.       Assessment & Plan:

## 2012-05-24 NOTE — Assessment & Plan Note (Signed)
unchanged Patient re-educated about  the importance of commitment to a  minimum of 150 minutes of exercise per week. The importance of healthy food choices with portion control discussed. Encouraged to start a food diary, count calories and to consider  joining a support group. Sample diet sheets offered. Goals set by the patient for the next several months.    

## 2012-05-24 NOTE — Assessment & Plan Note (Signed)
Followed by nephrologist

## 2012-05-24 NOTE — Assessment & Plan Note (Signed)
Controlled, no change in medication DASH diet and commitment to daily physical activity for a minimum of 30 minutes discussed and encouraged, as a part of hypertension management. The importance of attaining a healthy weight is also discussed.  

## 2012-05-24 NOTE — Assessment & Plan Note (Signed)
Uncontrolled , treated by endo

## 2012-05-24 NOTE — Assessment & Plan Note (Signed)
Uncontrolled Hyperlipidemia:Low fat diet discussed and encouraged. ] No med change, lab f/u

## 2012-05-24 NOTE — Assessment & Plan Note (Signed)
Followed and treated by endo

## 2012-05-24 NOTE — Assessment & Plan Note (Signed)
Acute myalgias, fever . Sore throat will broaden coverage to include influenza treatment

## 2012-05-24 NOTE — Assessment & Plan Note (Signed)
Anti inflammatories in office and orally

## 2012-06-28 ENCOUNTER — Other Ambulatory Visit: Payer: Self-pay | Admitting: Family Medicine

## 2012-07-01 ENCOUNTER — Other Ambulatory Visit: Payer: Self-pay

## 2012-07-01 DIAGNOSIS — I10 Essential (primary) hypertension: Secondary | ICD-10-CM

## 2012-07-01 MED ORDER — AMLODIPINE BESYLATE 10 MG PO TABS: 10.0000 mg | ORAL_TABLET | Freq: Every day | ORAL | Status: DC

## 2012-07-30 ENCOUNTER — Other Ambulatory Visit: Payer: Self-pay | Admitting: Family Medicine

## 2012-08-17 ENCOUNTER — Ambulatory Visit: Payer: 59 | Admitting: Family Medicine

## 2012-08-19 LAB — COMPLETE METABOLIC PANEL WITH GFR
ALT: 30 U/L (ref 0–35)
Albumin: 4 g/dL (ref 3.5–5.2)
CO2: 28 mEq/L (ref 19–32)
Calcium: 9.4 mg/dL (ref 8.4–10.5)
Chloride: 101 mEq/L (ref 96–112)
Creat: 1.08 mg/dL (ref 0.50–1.10)
GFR, Est African American: 66 mL/min
Potassium: 4.2 mEq/L (ref 3.5–5.3)
Sodium: 139 mEq/L (ref 135–145)
Total Protein: 6.9 g/dL (ref 6.0–8.3)

## 2012-08-19 LAB — LIPID PANEL
LDL Cholesterol: 151 mg/dL — ABNORMAL HIGH (ref 0–99)
Triglycerides: 289 mg/dL — ABNORMAL HIGH (ref <150)

## 2012-08-22 ENCOUNTER — Ambulatory Visit (INDEPENDENT_AMBULATORY_CARE_PROVIDER_SITE_OTHER): Payer: 59 | Admitting: Family Medicine

## 2012-08-22 ENCOUNTER — Encounter: Payer: Self-pay | Admitting: Family Medicine

## 2012-08-22 VITALS — BP 150/84 | HR 102 | Resp 16 | Ht 66.0 in | Wt 208.4 lb

## 2012-08-22 DIAGNOSIS — IMO0002 Reserved for concepts with insufficient information to code with codable children: Secondary | ICD-10-CM

## 2012-08-22 DIAGNOSIS — E669 Obesity, unspecified: Secondary | ICD-10-CM

## 2012-08-22 DIAGNOSIS — I10 Essential (primary) hypertension: Secondary | ICD-10-CM

## 2012-08-22 DIAGNOSIS — J302 Other seasonal allergic rhinitis: Secondary | ICD-10-CM | POA: Insufficient documentation

## 2012-08-22 DIAGNOSIS — E1129 Type 2 diabetes mellitus with other diabetic kidney complication: Secondary | ICD-10-CM

## 2012-08-22 DIAGNOSIS — B351 Tinea unguium: Secondary | ICD-10-CM | POA: Insufficient documentation

## 2012-08-22 DIAGNOSIS — L930 Discoid lupus erythematosus: Secondary | ICD-10-CM

## 2012-08-22 DIAGNOSIS — E785 Hyperlipidemia, unspecified: Secondary | ICD-10-CM

## 2012-08-22 DIAGNOSIS — E1165 Type 2 diabetes mellitus with hyperglycemia: Secondary | ICD-10-CM

## 2012-08-22 DIAGNOSIS — L259 Unspecified contact dermatitis, unspecified cause: Secondary | ICD-10-CM

## 2012-08-22 DIAGNOSIS — K219 Gastro-esophageal reflux disease without esophagitis: Secondary | ICD-10-CM

## 2012-08-22 DIAGNOSIS — L309 Dermatitis, unspecified: Secondary | ICD-10-CM

## 2012-08-22 DIAGNOSIS — G4730 Sleep apnea, unspecified: Secondary | ICD-10-CM

## 2012-08-22 DIAGNOSIS — N058 Unspecified nephritic syndrome with other morphologic changes: Secondary | ICD-10-CM

## 2012-08-22 MED ORDER — METHYLPREDNISOLONE ACETATE 80 MG/ML IJ SUSP
80.0000 mg | Freq: Once | INTRAMUSCULAR | Status: AC
  Administered 2012-08-22: 80 mg via INTRAMUSCULAR

## 2012-08-22 MED ORDER — PRAVASTATIN SODIUM 80 MG PO TABS: 80.0000 mg | ORAL_TABLET | Freq: Every evening | ORAL | Status: DC

## 2012-08-22 MED ORDER — HYDROXYZINE HCL 25 MG PO TABS: 25.0000 mg | ORAL_TABLET | Freq: Three times a day (TID) | ORAL | Status: DC | PRN

## 2012-08-22 MED ORDER — TERBINAFINE HCL 250 MG PO TABS: 250.0000 mg | ORAL_TABLET | Freq: Every day | ORAL | Status: DC

## 2012-08-22 MED ORDER — OMEPRAZOLE 20 MG PO CPDR: 20.0000 mg | DELAYED_RELEASE_CAPSULE | Freq: Every day | ORAL | Status: DC

## 2012-08-22 MED ORDER — PREDNISONE 5 MG PO TABS: 5.0000 mg | ORAL_TABLET | Freq: Two times a day (BID) | ORAL | Status: DC

## 2012-08-22 NOTE — Assessment & Plan Note (Signed)
Uncontrolled, not at goal. No med change at this time DASH diet and commitment to daily physical activity for a minimum of 30 minutes discussed and encouraged, as a part of hypertension management. The importance of attaining a healthy weight is also discussed.

## 2012-08-22 NOTE — Assessment & Plan Note (Signed)
Followed by endo, still uncontrolled and "varies with diet" HBA1C in January was 8.5 Patient advised to reduce carb and sweets, commit to regular physical activity, take meds as prescribed, test blood as directed, and attempt to lose weight, to improve blood sugar control.

## 2012-08-22 NOTE — Assessment & Plan Note (Signed)
Flare x past 3 to  4 weeks, generalized pruritus and rash on face and neck and forearm Short course of prednisone and depo medrol, alsoreferred back to dermatology at [facility]

## 2012-08-22 NOTE — Assessment & Plan Note (Signed)
Bilateral onychomycosis of toenails , also tinea pedis and tinea corporis affecting forearm, terbinafine x 4 month toal

## 2012-08-22 NOTE — Assessment & Plan Note (Signed)
Deteriorated. Hyperlipidemia:Low fat diet discussed and encouraged.  Pt overindulges in cheese reportedly, intends to stop this  Dose increase in pravachol

## 2012-08-22 NOTE — Assessment & Plan Note (Signed)
Uncontrolled, currently non compliant with machine

## 2012-08-22 NOTE — Progress Notes (Signed)
  Subjective:    Patient ID: Kathryn Beck, female    DOB: 11-13-1955, 57 y.o.   MRN: [id number]  HPI The PT is here for follow up and re-evaluation of chronic medical conditions, medication management and review of any available recent lab and radiology data.  Preventive health is updated, specifically  Cancer screening and Immunization.   Questions or concerns regarding consultations or procedures which the PT has had in the interim are  Addressed.She continues to be followed by endo and also nephrology The PT denies any adverse reactions to current medications since the last visit.  2 to 3 week h/o pruritic rash on face, neck upper extremities, has h/o discoid lupus and sun sensitivity, diagnosed and treated at [facility], local dermatologist has told her that the are unable to help her so she needs to return there for current flare C/o thickening and crumbling of toenails Denies commitment to regular exercise, and does report dietary indiscretion with no consistent effort at healthy eating      Review of Systems See HPI Denies recent fever or chills. Denies sinus pressure, nasal congestion, ear pain or sore throat. Denies chest congestion, productive cough or wheezing. Denies chest pains, palpitations and leg swelling C/o increased epigastric [pain and reflux symptoms for the past month, denies dysphagia, denies, nausea, vomiting,diarrhea or constipation.   Denies dysuria, frequency, hesitancy or incontinence. Denies joint pain, swelling and limitation in mobility. Denies headaches, seizures, numbness, or tingling. Denies depression, anxiety or insomnia.        Objective:   Physical Exam  Patient alert and oriented and in no cardiopulmonary distress.  HEENT: No facial asymmetry, EOMI, no sinus tenderness,  oropharynx pink and moist.  Neck supple no adenopathy.  Chest: Clear to auscultation bilaterally.  CVS: S1, S2 no murmurs, no S3.  ABD: Soft non tender. Bowel sounds  normal.  Ext: No edema  MS: Adequate ROM spine, shoulders, hips and knees.  Skin:fungal rash on forearms, tinaeppedis and onychomycosis, also hypopigmented macular rash with irregular border on forearms  Psych: Good eye contact, normal affect. Memory intact not anxious or depressed appearing.  CNS: CN 2-12 intact, power, tone and sensation normal throughout.       Assessment & Plan:

## 2012-08-22 NOTE — Addendum Note (Signed)
Addended by: Eual Fines on: 08/22/2012 01:30 PM   Modules accepted: Orders

## 2012-08-22 NOTE — Assessment & Plan Note (Signed)
Unchanged. Patient re-educated about  the importance of commitment to a  minimum of 150 minutes of exercise per week. The importance of healthy food choices with portion control discussed. Encouraged to start a food diary, count calories and to consider  joining a support group. Sample diet sheets offered. Goals set by the patient for the next several months.    

## 2012-08-22 NOTE — Patient Instructions (Addendum)
F/u in 4 month, pls call if you need me before  Fasting lipid, cmp and EGFR in 4 month  Dose increase in pravachol to 80mg  daily, oK to take TWO 40mg  tabs till done.  Blood pressure is higher than it should be, reduce salt, exercise and lose weight  Please cut back on cheese, oils and canned foods  Six pound weight loss.  Omeprazole for GERD, no caffeine and no eating after 7pm  For skin today, depo medrol 80mg  IM in office, then 2 medications are sent to the pharmacy and you are referred back to [facility] dermatology.  Microalb from office today  It is important that you exercise regularly at least 30 minutes 5 times a week. If you develop chest pain, have severe difficulty breathing, or feel very tired, stop exercising immediately and seek medical attention  A healthy diet is rich in fruit, vegetables and whole grains. Poultry fish, nuts and beans are a healthy choice for protein rather then red meat. A low sodium diet and drinking 64 ounces of water daily is generally recommended. Oils and sweet should be limited. Carbohydrates especially for those who are diabetic or overweight, should be limited to 30-45 gram per meal. It is important to eat on a regular schedule, at least 3 times daily. Snacks should be primarily fruits, vegetables or nuts.

## 2012-08-22 NOTE — Assessment & Plan Note (Signed)
Uncontrolled, with reflux of gastric contents into mouth and nose. Pt to stop caffeine, lose weight and resume PPI, denies dysphagia at this time

## 2012-08-23 ENCOUNTER — Other Ambulatory Visit: Payer: Self-pay

## 2012-08-23 DIAGNOSIS — I10 Essential (primary) hypertension: Secondary | ICD-10-CM

## 2012-08-23 LAB — MICROALBUMIN / CREATININE URINE RATIO
Creatinine, Urine: 124.4 mg/dL
Microalb Creat Ratio: 3258 mg/g — ABNORMAL HIGH (ref 0.0–30.0)

## 2012-08-23 MED ORDER — CYCLOBENZAPRINE HCL 10 MG PO TABS: 10.0000 mg | ORAL_TABLET | Freq: Every evening | ORAL | Status: DC | PRN

## 2012-08-23 MED ORDER — AMLODIPINE BESYLATE 10 MG PO TABS: 10.0000 mg | ORAL_TABLET | Freq: Every day | ORAL | Status: DC

## 2012-08-23 MED ORDER — VALSARTAN 320 MG PO TABS: 320.0000 mg | ORAL_TABLET | Freq: Every day | ORAL | Status: DC

## 2012-09-29 ENCOUNTER — Ambulatory Visit (INDEPENDENT_AMBULATORY_CARE_PROVIDER_SITE_OTHER): Payer: 59 | Admitting: Family Medicine

## 2012-09-29 ENCOUNTER — Encounter: Payer: Self-pay | Admitting: Family Medicine

## 2012-09-29 ENCOUNTER — Other Ambulatory Visit: Payer: Self-pay | Admitting: Family Medicine

## 2012-09-29 VITALS — BP 152/88 | HR 94 | Temp 98.6°F | Resp 16 | Ht 66.0 in | Wt 204.8 lb

## 2012-09-29 DIAGNOSIS — R748 Abnormal levels of other serum enzymes: Secondary | ICD-10-CM

## 2012-09-29 DIAGNOSIS — E1129 Type 2 diabetes mellitus with other diabetic kidney complication: Secondary | ICD-10-CM

## 2012-09-29 DIAGNOSIS — K5289 Other specified noninfective gastroenteritis and colitis: Secondary | ICD-10-CM

## 2012-09-29 DIAGNOSIS — E1165 Type 2 diabetes mellitus with hyperglycemia: Secondary | ICD-10-CM

## 2012-09-29 DIAGNOSIS — R112 Nausea with vomiting, unspecified: Secondary | ICD-10-CM

## 2012-09-29 DIAGNOSIS — I10 Essential (primary) hypertension: Secondary | ICD-10-CM

## 2012-09-29 DIAGNOSIS — N189 Chronic kidney disease, unspecified: Secondary | ICD-10-CM

## 2012-09-29 DIAGNOSIS — L930 Discoid lupus erythematosus: Secondary | ICD-10-CM

## 2012-09-29 DIAGNOSIS — K529 Noninfective gastroenteritis and colitis, unspecified: Secondary | ICD-10-CM | POA: Insufficient documentation

## 2012-09-29 DIAGNOSIS — G4730 Sleep apnea, unspecified: Secondary | ICD-10-CM

## 2012-09-29 DIAGNOSIS — R110 Nausea: Secondary | ICD-10-CM

## 2012-09-29 DIAGNOSIS — E785 Hyperlipidemia, unspecified: Secondary | ICD-10-CM

## 2012-09-29 DIAGNOSIS — IMO0002 Reserved for concepts with insufficient information to code with codable children: Secondary | ICD-10-CM

## 2012-09-29 DIAGNOSIS — N058 Unspecified nephritic syndrome with other morphologic changes: Secondary | ICD-10-CM

## 2012-09-29 MED ORDER — HYOSCYAMINE SULFATE ER 0.375 MG PO TB12: 0.3750 mg | ORAL_TABLET | Freq: Two times a day (BID) | ORAL | Status: DC | PRN

## 2012-09-29 MED ORDER — ONDANSETRON HCL 4 MG/2ML IJ SOLN
4.0000 mg | Freq: Once | INTRAMUSCULAR | Status: AC
  Administered 2012-09-29: 4 mg via INTRAMUSCULAR

## 2012-09-29 MED ORDER — DIPHENOXYLATE-ATROPINE 2.5-0.025 MG PO TABS: 1.0000 | ORAL_TABLET | Freq: Four times a day (QID) | ORAL | Status: DC | PRN

## 2012-09-29 MED ORDER — ONDANSETRON HCL 4 MG PO TABS: 4.0000 mg | ORAL_TABLET | Freq: Every day | ORAL | Status: DC | PRN

## 2012-09-29 NOTE — Progress Notes (Signed)
  Subjective:    Patient ID: Kathryn Beck, female    DOB: 06/11/1955, 57 y.o.   MRN: [id number]  HPI 4 day h/o vomit , diarrheah, chills and body aches, visiting family member had GI bug. Vomiting up to today, did this 6 times yesterday, Watery stool brown up to 10 , no blood or mucus  Taking peptobismol, no med for vomitting. Abd cramps prescent, no blood or mucus in stool    Review of Systems See HPI Denies recent fever or chills. Denies sinus pressure, nasal congestion, ear pain or sore throat. Denies chest congestion, productive cough or wheezing. Denies chest pains, palpitations and leg swelling .   Denies dysuria, frequency, hesitancy or incontinence. Denies joint pain, swelling and limitation in mobility. Denies headaches, seizures, numbness, or tingling. Denies depression, anxiety or insomnia. Skin rash persists, not as pruritic, appt with derm in 2 month      Objective:   Physical Exam Patient alert and oriented and in no cardiopulmonary distress.Ill appearing  HEENT: No facial asymmetry, EOMI, no sinus tenderness,  oropharynx pink and moist.  Neck supple no adenopathy.  Chest: Clear to auscultation bilaterally.  CVS: S1, S2 no murmurs, no S3.  ABD: Soft diffuse superficial tenderness, no guarding or rebound, hyperactive bowel sounds  Ext: No edema  MS: Adequate ROM spine, shoulders, hips and knees.  Skin: Intact, no ulcerations or rash noted.  Psych: Good eye contact, normal affect. Memory intact not anxious or depressed appearing.  CNS: CN 2-12 intact, power, tone and sensation normal throughout.        Assessment & Plan:

## 2012-09-29 NOTE — Patient Instructions (Addendum)
F/u ias before, call if you need me before  Three medications are sent in for symptom management of acute gastroenteritis , whihc is what you are being treated for.  Good hand hygiene is vital to prevent spread.  You are referred to Dr Birdie Sons to follow up kidney function  CMP and EGFR today, copy to Dr Birdie Sons  Call back on Monday if no better, we will provide a stool container for testing for bacterial infection  Please follow the BRAT diet and ensure you stay hydrated  Diet for Diarrhea, Adult Frequent, runny stools (diarrhea) may be caused or worsened by food or drink. Diarrhea may be relieved by changing your diet. Since diarrhea can last up to 7 days, it is easy for you to lose too much fluid from the body and become dehydrated. Fluids that are lost need to be replaced. Along with a modified diet, make sure you drink enough fluids to keep your urine clear or pale yellow. DIET INSTRUCTIONS  Ensure adequate fluid intake (hydration): have 1 cup (8 oz) of fluid for each diarrhea episode. Avoid fluids that contain simple sugars or sports drinks, fruit juices, whole milk products, and sodas. Your urine should be clear or pale yellow if you are drinking enough fluids. Hydrate with an oral rehydration solution that you can purchase at pharmacies, retail stores, and online. You can prepare an oral rehydration solution at home by mixing the following ingredients together:    tsp table salt.   tsp baking soda.   tsp salt substitute containing potassium chloride.  1  tablespoons sugar.  1 L (34 oz) of water.  Certain foods and beverages may increase the speed at which food moves through the gastrointestinal (GI) tract. These foods and beverages should be avoided and include:  Caffeinated and alcoholic beverages.  High-fiber foods, such as raw fruits and vegetables, nuts, seeds, and whole grain breads and cereals.  Foods and beverages sweetened with sugar alcohols, such as xylitol,  sorbitol, and mannitol.  Some foods may be well tolerated and may help thicken stool including:  Starchy foods, such as rice, toast, pasta, low-sugar cereal, oatmeal, grits, baked potatoes, crackers, and bagels.   Bananas.   Applesauce.  Add probiotic-rich foods to help increase healthy bacteria in the GI tract, such as yogurt and fermented milk products. RECOMMENDED FOODS AND BEVERAGES Starches Choose foods with less than 2 g of fiber per serving.  Recommended:  White, [place], and pita breads, plain rolls, buns, bagels. Plain muffins, matzo. Soda, saltine, or graham crackers. Pretzels, melba toast, zwieback. Cooked cereals made with water: cornmeal, farina, cream cereals. Dry cereals: refined corn, wheat, rice. Potatoes prepared any way without skins, refined macaroni, spaghetti, noodles, refined rice.  Avoid:  Bread, rolls, or crackers made with whole wheat, multi-grains, rye, bran seeds, nuts, or coconut. Corn tortillas or taco shells. Cereals containing whole grains, multi-grains, bran, coconut, nuts, raisins. Cooked or dry oatmeal. Coarse wheat cereals, granola. Cereals advertised as "high-fiber." Potato skins. Whole grain pasta, wild or brown rice. Popcorn. Sweet potatoes, yams. Sweet rolls, doughnuts, waffles, pancakes, sweet breads. Vegetables  Recommended: Strained tomato and vegetable juices. Most well-cooked and canned vegetables without seeds. Fresh: Tender lettuce, cucumber without the skin, cabbage, spinach, bean sprouts.  Avoid: Fresh, cooked, or canned: Artichokes, baked beans, beet greens, broccoli, Brussels sprouts, corn, kale, legumes, peas, sweet potatoes. Cooked: Green or red cabbage, spinach. Avoid large servings of any vegetables because vegetables shrink when cooked, and they contain more fiber per serving than  fresh vegetables. Fruit  Recommended: Cooked or canned: Apricots, applesauce, cantaloupe, cherries, fruit cocktail, grapefruit, grapes, kiwi, mandarin  oranges, peaches, pears, plums, watermelon. Fresh: Apples without skin, ripe banana, grapes, cantaloupe, cherries, grapefruit, peaches, oranges, plums. Keep servings limited to  cup or 1 piece.  Avoid: Fresh: Apples with skin, apricots, mangoes, pears, raspberries, strawberries. Prune juice, stewed or dried prunes. Dried fruits, raisins, dates. Large servings of all fresh fruits. Protein  Recommended: Ground or well-cooked tender beef, ham, veal, lamb, pork, or poultry. Eggs. Fish, oysters, shrimp, lobster, other seafoods. Liver, organ meats.  Avoid: Tough, fibrous meats with gristle. Peanut butter, smooth or chunky. Cheese, nuts, seeds, legumes, dried peas, beans, lentils. Dairy  Recommended: Yogurt, lactose-free milk, kefir, drinkable yogurt, buttermilk, soy milk, or plain hard cheese.  Avoid: Milk, chocolate milk, beverages made with milk, such as milkshakes. Soups  Recommended: Bouillon, broth, or soups made from allowed foods. Any strained soup.  Avoid: Soups made from vegetables that are not allowed, cream or milk-based soups. Desserts and Sweets  Recommended: Sugar-free gelatin, sugar-free frozen ice pops made without sugar alcohol.  Avoid: Plain cakes and cookies, pie made with fruit, pudding, custard, cream pie. Gelatin, fruit, ice, sherbet, frozen ice pops. Ice cream, ice milk without nuts. Plain hard candy, honey, jelly, molasses, syrup, sugar, chocolate syrup, gumdrops, marshmallows. Fats and Oils  Recommended: Limit fats to less than 8 tsp per day.  Avoid: Seeds, nuts, olives, avocados. Margarine, butter, cream, mayonnaise, salad oils, plain salad dressings. Plain gravy, crisp bacon without rind. Beverages  Recommended: Water, decaffeinated teas, oral rehydration solutions, sugar-free beverages not sweetened with sugar alcohols.  Avoid: Fruit juices, caffeinated beverages (coffee, tea, soda), alcohol, sports drinks, or lemon-lime soda. Condiments  Recommended:  Ketchup, mustard, horseradish, vinegar, cocoa powder. Spices in moderation: allspice, basil, bay leaves, celery powder or leaves, cinnamon, cumin powder, curry powder, ginger, mace, marjoram, onion or garlic powder, oregano, paprika, parsley flakes, ground pepper, rosemary, sage, savory, tarragon, thyme, turmeric.  Avoid: Coconut, honey. Document Released: 05/30/2003 Document Revised: 12/02/2011 Document Reviewed: 07/24/2011 [facility] Patient Information 2014 [place].

## 2012-09-30 LAB — COMPLETE METABOLIC PANEL WITH GFR
ALT: 90 U/L — ABNORMAL HIGH (ref 0–35)
AST: 101 U/L — ABNORMAL HIGH (ref 0–37)
CO2: 25 mEq/L (ref 19–32)
Calcium: 9.1 mg/dL (ref 8.4–10.5)
Chloride: 106 mEq/L (ref 96–112)
GFR, Est African American: 69 mL/min
Potassium: 3.6 mEq/L (ref 3.5–5.3)
Sodium: 140 mEq/L (ref 135–145)
Total Protein: 6.4 g/dL (ref 6.0–8.3)

## 2012-10-02 DIAGNOSIS — R110 Nausea: Secondary | ICD-10-CM | POA: Insufficient documentation

## 2012-10-02 NOTE — Assessment & Plan Note (Signed)
Rash present , has appt at [facility] in September

## 2012-10-02 NOTE — Assessment & Plan Note (Signed)
Reports compliance with CPAP.

## 2012-10-02 NOTE — Assessment & Plan Note (Signed)
Patient advised to reduce carb and sweets, commit to regular physical activity, take meds as prescribed, test blood as directed, and attempt to lose weight, to improve blood sugar control. Has upcoming appt with endo, blood sugar when last checked was uncontrolled

## 2012-10-02 NOTE — Assessment & Plan Note (Signed)
Acute GE x 3 days, slight improvement x 1 day but still very symptomatic Supportive and symptomatic treatment only, pt to call back if worsens, check labs today Zofran in office

## 2012-10-02 NOTE — Assessment & Plan Note (Signed)
Severe proteinuria, needs to f/u with nephrology

## 2012-10-02 NOTE — Assessment & Plan Note (Signed)
Zofran in office and also prescribed

## 2012-10-02 NOTE — Assessment & Plan Note (Signed)
Marked elevatiupon in liver enzymes, needs to stop pravastatin, low fat diet only

## 2012-10-02 NOTE — Assessment & Plan Note (Signed)
Elevated at this visit , however pt has been having excessive vomiting, no med change

## 2012-10-05 NOTE — Addendum Note (Signed)
Addended by: Eual Fines on: 10/05/2012 12:30 PM   Modules accepted: Orders

## 2012-10-18 ENCOUNTER — Other Ambulatory Visit: Payer: Self-pay

## 2012-10-18 DIAGNOSIS — I10 Essential (primary) hypertension: Secondary | ICD-10-CM

## 2012-10-18 DIAGNOSIS — K219 Gastro-esophageal reflux disease without esophagitis: Secondary | ICD-10-CM

## 2012-10-18 MED ORDER — AMLODIPINE BESYLATE 10 MG PO TABS: 10.0000 mg | ORAL_TABLET | Freq: Every day | ORAL | Status: DC

## 2012-10-18 MED ORDER — OMEPRAZOLE 20 MG PO CPDR: 20.0000 mg | DELAYED_RELEASE_CAPSULE | Freq: Every day | ORAL | Status: DC

## 2012-10-18 MED ORDER — TRIAMTERENE-HCTZ 75-50 MG PO TABS: 1.0000 | ORAL_TABLET | Freq: Every day | ORAL | Status: DC

## 2012-12-02 ENCOUNTER — Other Ambulatory Visit: Payer: Self-pay | Admitting: Family Medicine

## 2012-12-20 ENCOUNTER — Other Ambulatory Visit: Payer: Self-pay | Admitting: Family Medicine

## 2012-12-21 LAB — COMPLETE METABOLIC PANEL WITH GFR
ALT: 31 U/L (ref 0–35)
AST: 32 U/L (ref 0–37)
Albumin: 3.9 g/dL (ref 3.5–5.2)
Alkaline Phosphatase: 80 U/L (ref 39–117)
BUN: 20 mg/dL (ref 6–23)
CO2: 27 mEq/L (ref 19–32)
Calcium: 9.9 mg/dL (ref 8.4–10.5)
Chloride: 101 mEq/L (ref 96–112)
Creat: 1.25 mg/dL — ABNORMAL HIGH (ref 0.50–1.10)
GFR, Est African American: 56 mL/min — ABNORMAL LOW
GFR, Est Non African American: 48 mL/min — ABNORMAL LOW
Glucose, Bld: 125 mg/dL — ABNORMAL HIGH (ref 70–99)
Potassium: 4.2 mEq/L (ref 3.5–5.3)
Sodium: 137 mEq/L (ref 135–145)
Total Bilirubin: 0.3 mg/dL (ref 0.3–1.2)
Total Protein: 7 g/dL (ref 6.0–8.3)

## 2012-12-21 LAB — LIPID PANEL
Cholesterol: 282 mg/dL — ABNORMAL HIGH (ref 0–200)
HDL: 45 mg/dL (ref >39)
Total CHOL/HDL Ratio: 6.3 Ratio
Triglycerides: 441 mg/dL — ABNORMAL HIGH (ref <150)

## 2012-12-21 LAB — LDL CHOLESTEROL, DIRECT: Direct LDL: 166 mg/dL — ABNORMAL HIGH

## 2012-12-22 ENCOUNTER — Encounter: Payer: Self-pay | Admitting: Family Medicine

## 2012-12-22 ENCOUNTER — Ambulatory Visit (INDEPENDENT_AMBULATORY_CARE_PROVIDER_SITE_OTHER): Payer: 59 | Admitting: Family Medicine

## 2012-12-22 VITALS — BP 140/96 | HR 100 | Resp 16 | Wt 207.1 lb

## 2012-12-22 DIAGNOSIS — R2233 Localized swelling, mass and lump, upper limb, bilateral: Secondary | ICD-10-CM

## 2012-12-22 DIAGNOSIS — Z23 Encounter for immunization: Secondary | ICD-10-CM

## 2012-12-22 DIAGNOSIS — R290 Tetany: Secondary | ICD-10-CM

## 2012-12-22 DIAGNOSIS — R252 Cramp and spasm: Secondary | ICD-10-CM

## 2012-12-22 DIAGNOSIS — D171 Benign lipomatous neoplasm of skin and subcutaneous tissue of trunk: Secondary | ICD-10-CM | POA: Insufficient documentation

## 2012-12-22 DIAGNOSIS — E1129 Type 2 diabetes mellitus with other diabetic kidney complication: Secondary | ICD-10-CM

## 2012-12-22 DIAGNOSIS — E785 Hyperlipidemia, unspecified: Secondary | ICD-10-CM

## 2012-12-22 DIAGNOSIS — N058 Unspecified nephritic syndrome with other morphologic changes: Secondary | ICD-10-CM

## 2012-12-22 DIAGNOSIS — G4730 Sleep apnea, unspecified: Secondary | ICD-10-CM

## 2012-12-22 DIAGNOSIS — K219 Gastro-esophageal reflux disease without esophagitis: Secondary | ICD-10-CM

## 2012-12-22 DIAGNOSIS — R229 Localized swelling, mass and lump, unspecified: Secondary | ICD-10-CM

## 2012-12-22 DIAGNOSIS — B351 Tinea unguium: Secondary | ICD-10-CM

## 2012-12-22 DIAGNOSIS — E669 Obesity, unspecified: Secondary | ICD-10-CM

## 2012-12-22 DIAGNOSIS — IMO0002 Reserved for concepts with insufficient information to code with codable children: Secondary | ICD-10-CM

## 2012-12-22 DIAGNOSIS — I10 Essential (primary) hypertension: Secondary | ICD-10-CM

## 2012-12-22 DIAGNOSIS — D1779 Benign lipomatous neoplasm of other sites: Secondary | ICD-10-CM

## 2012-12-22 DIAGNOSIS — L930 Discoid lupus erythematosus: Secondary | ICD-10-CM

## 2012-12-22 DIAGNOSIS — Z1239 Encounter for other screening for malignant neoplasm of breast: Secondary | ICD-10-CM

## 2012-12-22 LAB — MAGNESIUM: Magnesium: 1.8 mg/dL (ref 1.5–2.5)

## 2012-12-22 MED ORDER — CLONIDINE HCL 0.1 MG PO TABS: ORAL_TABLET | ORAL | Status: DC

## 2012-12-22 MED ORDER — AMLODIPINE BESYLATE 10 MG PO TABS: 10.0000 mg | ORAL_TABLET | Freq: Every day | ORAL | Status: DC

## 2012-12-22 MED ORDER — CYCLOBENZAPRINE HCL 10 MG PO TABS: 10.0000 mg | ORAL_TABLET | Freq: Every evening | ORAL | Status: DC | PRN

## 2012-12-22 MED ORDER — OMEPRAZOLE 20 MG PO CPDR: DELAYED_RELEASE_CAPSULE | ORAL | Status: DC

## 2012-12-22 MED ORDER — TRIAMTERENE-HCTZ 75-50 MG PO TABS: 1.0000 | ORAL_TABLET | Freq: Every day | ORAL | Status: DC

## 2012-12-22 MED ORDER — ALBUTEROL SULFATE HFA 108 (90 BASE) MCG/ACT IN AERS: 1.0000 | INHALATION_SPRAY | Freq: Four times a day (QID) | RESPIRATORY_TRACT | Status: DC | PRN

## 2012-12-22 MED ORDER — VALSARTAN 320 MG PO TABS: 320.0000 mg | ORAL_TABLET | Freq: Every day | ORAL | Status: DC

## 2012-12-22 MED ORDER — HYDROXYZINE HCL 25 MG PO TABS: 25.0000 mg | ORAL_TABLET | Freq: Three times a day (TID) | ORAL | Status: DC | PRN

## 2012-12-22 NOTE — Progress Notes (Signed)
  Subjective:    Patient ID: Kathryn Beck, female    DOB: 1955/04/23, 57 y.o.   MRN: [id number]  HPI The PT is here for follow up and re-evaluation of chronic medical conditions, medication management and review of any available recent lab and radiology data.  Preventive health is updated, specifically  Cancer screening and Immunization.   Questions or concerns regarding consultations or procedures which the PT has had in the interim are  Addressed.Has had eye exam , also has upcoming dermatology and endo exams The PT denies any adverse reactions to current medications since the last visit.  C/o persistent hypo pigmented rash on forearms despite oral antifungal, skin also dry and pruritic, States blood sugars are better, and often fasting is under 150, will get appropriate lab shortly C/o painless swellings in armpits and on back wants them removed if possible      Review of Systems See HPI Denies recent fever or chills. Denies sinus pressure, nasal congestion, ear pain or sore throat. Denies chest congestion, productive cough or wheezing. Denies chest pains, palpitations and leg swelling Denies abdominal pain, nausea, vomiting,diarrhea or constipation.   Denies dysuria, frequency, hesitancy or incontinence. Denies joint pain, swelling and limitation in mobility.c/o disabling spasm of hands in past several weeks Denies headaches, seizures, numbness, or tingling. Denies depression, anxiety or insomnia.       Objective:   Physical Exam  Patient alert and oriented and in no cardiopulmonary distress.  HEENT: No facial asymmetry, EOMI, no sinus tenderness,  oropharynx pink and moist.  Neck supple no adenopathy.  Chest: Clear to auscultation bilaterally.  CVS: S1, S2 no murmurs, no S3.  ABD: Soft non tender. Bowel sounds normal.  Ext: No edema  MS: Adequate ROM spine, shoulders, hips and knees.  Skin: Intact, hypopigmented  rash noted.on inner aspect forearms. Lipoma on  back, accessory tissue in both axilla, likely supplementary breast tissue  Psych: Good eye contact, normal affect. Memory intact not anxious or depressed appearing.  CNS: CN 2-12 intact, power, tone and sensation normal throughout.       Assessment & Plan:

## 2012-12-22 NOTE — Patient Instructions (Addendum)
F/u in mid January, call if you need me before  Fasting lipid and cmp and EGFR in mid January.  Change eating as we  Discussed , cut out egg yolks and cheese to improve your cholesterol  Liver is now normal   Discuss skin with dermatologist at [facility] when you see them  Use muscle relaxant one at bedtime to see if this helps with spasms in hands  You are referred to Dr Arnoldo Morale to check the swellings on your body, back and armpits  Stop at checkout for mammogram appt pls

## 2012-12-23 LAB — VITAMIN D 25 HYDROXY (VIT D DEFICIENCY, FRACTURES): Vit D, 25-Hydroxy: 27 ng/mL — ABNORMAL LOW (ref 30–89)

## 2012-12-24 DIAGNOSIS — M62838 Other muscle spasm: Secondary | ICD-10-CM | POA: Insufficient documentation

## 2012-12-24 NOTE — Assessment & Plan Note (Signed)
No electrolyte disturbance, trial of bedtime muscle relaxant , also advised trial of mustard

## 2012-12-24 NOTE — Assessment & Plan Note (Signed)
Controlled, no change in medication  

## 2012-12-24 NOTE — Assessment & Plan Note (Signed)
Deteriorated with discontinuation of statins, however markedly elvated LFT's have now normalized. Dietary management only at this time, counseling done

## 2012-12-24 NOTE — Assessment & Plan Note (Signed)
Continue oral med. Hypopigmentation on arms persists, has upcoming appt with derm at [facility] and will discuss at that time

## 2012-12-24 NOTE — Assessment & Plan Note (Signed)
Refer to genm surg for evaluation of lipoma and also excess axillary tissue

## 2012-12-24 NOTE — Assessment & Plan Note (Signed)
Compliant with CPAP, feels better since using regularly

## 2012-12-24 NOTE — Assessment & Plan Note (Signed)
Unchanged. Patient re-educated about  the importance of commitment to a  minimum of 150 minutes of exercise per week. The importance of healthy food choices with portion control discussed. Encouraged to start a food diary, count calories and to consider  joining a support group. Sample diet sheets offered. Goals set by the patient for the next several months.    

## 2012-12-24 NOTE — Assessment & Plan Note (Signed)
Patient advised to reduce carb and sweets, commit to regular physical activity, take meds as prescribed, test blood as directed, and attempt to lose weight, to improve blood sugar control. Followed by endo and has upcoming appt reportedly, states there has been improvement HBA1C needed for 2014

## 2012-12-26 ENCOUNTER — Ambulatory Visit (HOSPITAL_COMMUNITY): Payer: 59

## 2012-12-27 ENCOUNTER — Ambulatory Visit (HOSPITAL_COMMUNITY)
Admission: RE | Admit: 2012-12-27 | Discharge: 2012-12-27 | Disposition: A | Discharge status: Home / Self Care | Payer: 59 | Source: Ambulatory Visit | Attending: Family Medicine | Admitting: Family Medicine

## 2012-12-27 DIAGNOSIS — Z1239 Encounter for other screening for malignant neoplasm of breast: Secondary | ICD-10-CM

## 2012-12-27 DIAGNOSIS — Z1231 Encounter for screening mammogram for malignant neoplasm of breast: Secondary | ICD-10-CM | POA: Insufficient documentation

## 2012-12-27 NOTE — H&P (Signed)
  NTS SOAP Note  Vital Signs:  Vitals as of: [id number]: Systolic A999333: Diastolic 86: Heart Rate 123XX123: Temp 77F: Height 77ft 6in: Weight 210Lbs 0 Ounces: BMI 33.89  BMI : 33.89 kg/m2  Subjective: This 49 Years 66 Months old Female presents for of lumps in both axillas.  Have been present for some time, but are increasing in size and causing her discomfort.  No drainage noted.  Occ tenderness/irritation noted.  Review of Symptoms:  Constitutional:unremarkable   Head:unremarkable    Eyes:unremarkable   sinus problems Cardiovascular:  unremarkable   Respiratory:unremarkable   Gastrointestin    heartburn Genitourinary:unremarkable       joint and back pain dry Breast:unremarkable   Hematolgic/Lymphatic:unremarkable       hay fever   Past Medical History:    Reviewed   Past Medical History  Surgical History: foot surgery Medical Problems: HTN, NIDDM Allergies: nkda Medications: BP pill, glucose pill   Social History:Reviewed  Social History  Preferred Language: English Race:  Black or African American Ethnicity: Not Hispanic / Latino Age: 57 Years 8 Months Marital Status:  S Alcohol:  No Recreational drug(s):  No   Smoking Status: Never smoker reviewed on 12/27/2012 Functional Status reviewed on mm/dd/yyyy ------------------------------------------------ Bathing: Normal Cooking: Normal Dressing: Normal Driving: Normal Eating: Normal Managing Meds: Normal Oral Care: Normal Shopping: Normal Toileting: Normal Transferring: Normal Walking: Normal Cognitive Status reviewed on mm/dd/yyyy ------------------------------------------------ Attention: Normal Decision Making: Normal Language: Normal Memory: Normal Motor: Normal Perception: Normal Problem Solving: Normal Visual and Spatial: Normal   Family History:  Reviewed  Family Health History Mother, Living; Diabetes mellitus, unspecified type;  Father,  Living; Prostate cancer; Hypertension (high blood pressure);     Objective Information: General:  Well appearing, well nourished in no distress.      Large soft subcutaneous tissue masses in left axillas, mobile, no drainage noted.  No lymphadenopathy.  Also, large >6cm ovoid mass over left scapula, subcutaneous in nature. Neck:  Supple without lymphadenopathy.  Heart:  RRR, no murmur Lungs:    CTA bilaterally, no wheezes, rhonchi, rales.  Breathing unlabored.  Assessment:Soft tissue neoplasm of unknown etiology, axillas  Soft tissue neoplasm, back, appears benign  Diagnosis &amp; Procedure Smart Code   Plan:Scheduled for excision of soft tissue neoplasms, axillas on 01/09/13.   Patient Education:Alternative treatments to surgery were discussed with patient (and family).  Risks and benefits  of procedure were fully explained to the patient (and family) who gave informed consent. Patient/family questions were addressed.  Follow-up:PRN

## 2012-12-29 ENCOUNTER — Encounter (HOSPITAL_COMMUNITY): Payer: Self-pay | Admitting: Pharmacy Technician

## 2013-01-03 ENCOUNTER — Encounter (HOSPITAL_COMMUNITY)
Admission: RE | Admit: 2013-01-03 | Discharge: 2013-01-03 | Disposition: A | Discharge status: Home / Self Care | Payer: 59 | Source: Ambulatory Visit | Attending: General Surgery | Admitting: General Surgery

## 2013-01-03 ENCOUNTER — Encounter (HOSPITAL_COMMUNITY): Payer: Self-pay

## 2013-01-03 DIAGNOSIS — Z01812 Encounter for preprocedural laboratory examination: Secondary | ICD-10-CM | POA: Insufficient documentation

## 2013-01-03 LAB — CBC WITH DIFFERENTIAL/PLATELET
Basophils Relative: 0 % (ref 0–1)
Eosinophils Absolute: 0.1 10*3/uL (ref 0.0–0.7)
Eosinophils Relative: 2 % (ref 0–5)
HCT: 36.9 % (ref 36.0–46.0)
Hemoglobin: 11.9 g/dL — ABNORMAL LOW (ref 12.0–15.0)
MCH: 23 pg — ABNORMAL LOW (ref 26.0–34.0)
MCHC: 32.2 g/dL (ref 30.0–36.0)
MCV: 71.4 fL — ABNORMAL LOW (ref 78.0–100.0)
Monocytes Absolute: 0.4 10*3/uL (ref 0.1–1.0)
Monocytes Relative: 6 % (ref 3–12)

## 2013-01-03 LAB — BASIC METABOLIC PANEL
BUN: 16 mg/dL (ref 6–23)
CO2: 25 mEq/L (ref 19–32)
Chloride: 101 mEq/L (ref 96–112)
Glucose, Bld: 197 mg/dL — ABNORMAL HIGH (ref 70–99)
Potassium: 4.1 mEq/L (ref 3.5–5.1)
Sodium: 140 mEq/L (ref 135–145)

## 2013-01-03 MED ORDER — CHLORHEXIDINE GLUCONATE 4 % EX LIQD: 1.0000 "application " | Freq: Once | CUTANEOUS | Status: DC

## 2013-01-03 NOTE — Patient Instructions (Addendum)
Kathryn Beck  01/03/2013   Your procedure is scheduled on:  01/09/13  Report to [facility] at 06:15 AM.  Call this number if you have problems the morning of surgery: [phone]   Remember:   Do not eat food or drink liquids after midnight.   Take these medicines the morning of surgery with A SIP OF WATER: Amlodipine, Clonidine, Omeprazole, Triamterene-HCTZ and Valsartan. Take only half of your dose of Levemir the night before surgery. Do not take any insulin the morning of surgery.   Do not wear jewelry, make-up or nail polish.  Do not wear lotions, powders, or perfumes.   Do not shave 48 hours prior to surgery. Men may shave face and neck.  Do not bring valuables to the hospital.  [facility] is not responsible for any belongings or valuables.               Contacts, dentures or bridgework may not be worn into surgery.  Leave suitcase in the car. After surgery it may be brought to your room.  For patients admitted to the hospital, discharge time is determined by your treatment team.               Patients discharged the day of surgery will not be allowed to drive home.    Special Instructions: Shower using CHG 2 nights before surgery and the night before surgery.  If you shower the day of surgery use CHG.  Use special wash - you have one bottle of CHG for all showers.  You should use approximately 1/3 of the bottle for each shower.   Please read over the following fact sheets that you were given: Pain Booklet, Surgical Site Infection Prevention, Anesthesia Post-op Instructions and Care and Recovery After Surgery    PATIENT INSTRUCTIONS POST-ANESTHESIA  IMMEDIATELY FOLLOWING SURGERY:  Do not drive or operate machinery for the first twenty four hours after surgery.  Do not make any important decisions for twenty four hours after surgery or while taking narcotic pain medications or sedatives.  If you develop intractable nausea and vomiting or a severe headache please notify your  doctor immediately.  FOLLOW-UP:  Please make an appointment with your surgeon as instructed. You do not need to follow up with anesthesia unless specifically instructed to do so.  WOUND CARE INSTRUCTIONS (if applicable):  Keep a dry clean dressing on the anesthesia/puncture wound site if there is drainage.  Once the wound has quit draining you may leave it open to air.  Generally you should leave the bandage intact for twenty four hours unless there is drainage.  If the epidural site drains for more than 36-48 hours please call the anesthesia department.  QUESTIONS?:  Please feel free to call your physician or the hospital operator if you have any questions, and they will be happy to assist you.

## 2013-01-09 ENCOUNTER — Ambulatory Visit (HOSPITAL_COMMUNITY): Payer: 59 | Admitting: Anesthesiology

## 2013-01-09 ENCOUNTER — Encounter (HOSPITAL_COMMUNITY): Payer: 59 | Admitting: Anesthesiology

## 2013-01-09 ENCOUNTER — Encounter (HOSPITAL_COMMUNITY): Payer: Self-pay | Admitting: *Deleted

## 2013-01-09 ENCOUNTER — Encounter (HOSPITAL_COMMUNITY)
Admission: RE | Disposition: A | Discharge status: Home / Self Care | Payer: Self-pay | Source: Ambulatory Visit | Attending: General Surgery

## 2013-01-09 ENCOUNTER — Ambulatory Visit (HOSPITAL_COMMUNITY)
Admission: RE | Admit: 2013-01-09 | Discharge: 2013-01-09 | Disposition: A | Discharge status: Home / Self Care | Payer: 59 | Source: Ambulatory Visit | Attending: General Surgery | Admitting: General Surgery

## 2013-01-09 DIAGNOSIS — E119 Type 2 diabetes mellitus without complications: Secondary | ICD-10-CM | POA: Insufficient documentation

## 2013-01-09 DIAGNOSIS — Z01812 Encounter for preprocedural laboratory examination: Secondary | ICD-10-CM | POA: Insufficient documentation

## 2013-01-09 DIAGNOSIS — D1779 Benign lipomatous neoplasm of other sites: Secondary | ICD-10-CM | POA: Insufficient documentation

## 2013-01-09 DIAGNOSIS — I10 Essential (primary) hypertension: Secondary | ICD-10-CM | POA: Insufficient documentation

## 2013-01-09 HISTORY — PX: MASS EXCISION: SHX2000

## 2013-01-09 LAB — GLUCOSE, CAPILLARY
Glucose-Capillary: 174 mg/dL — ABNORMAL HIGH (ref 70–99)
Glucose-Capillary: 222 mg/dL — ABNORMAL HIGH (ref 70–99)

## 2013-01-09 SURGERY — EXCISION MASS
Anesthesia: General | Site: Axilla | Laterality: Right | Wound class: Clean

## 2013-01-09 MED ORDER — PROPOFOL 10 MG/ML IV BOLUS
INTRAVENOUS | Status: DC | PRN
  Administered 2013-01-09: 150 mg via INTRAVENOUS

## 2013-01-09 MED ORDER — ONDANSETRON HCL 4 MG/2ML IJ SOLN: 4.0000 mg | Freq: Once | INTRAMUSCULAR | Status: DC | PRN

## 2013-01-09 MED ORDER — LIDOCAINE HCL (PF) 1 % IJ SOLN
INTRAMUSCULAR | Status: AC
  Filled 2013-01-09: qty 5

## 2013-01-09 MED ORDER — EPHEDRINE SULFATE 50 MG/ML IJ SOLN
INTRAMUSCULAR | Status: AC
  Filled 2013-01-09: qty 1

## 2013-01-09 MED ORDER — MIDAZOLAM HCL 2 MG/2ML IJ SOLN
1.0000 mg | INTRAMUSCULAR | Status: DC | PRN
  Administered 2013-01-09: 2 mg via INTRAVENOUS
  Filled 2013-01-09: qty 2

## 2013-01-09 MED ORDER — ONDANSETRON HCL 4 MG/2ML IJ SOLN
4.0000 mg | Freq: Once | INTRAMUSCULAR | Status: AC
  Administered 2013-01-09: 4 mg via INTRAVENOUS
  Filled 2013-01-09: qty 2

## 2013-01-09 MED ORDER — FENTANYL CITRATE 0.05 MG/ML IJ SOLN
INTRAMUSCULAR | Status: AC
  Filled 2013-01-09: qty 5

## 2013-01-09 MED ORDER — LIDOCAINE HCL (CARDIAC) 20 MG/ML IV SOLN
INTRAVENOUS | Status: DC | PRN
  Administered 2013-01-09: 50 mg via INTRAVENOUS

## 2013-01-09 MED ORDER — PROPOFOL 10 MG/ML IV EMUL
INTRAVENOUS | Status: AC
  Filled 2013-01-09: qty 20

## 2013-01-09 MED ORDER — 0.9 % SODIUM CHLORIDE (POUR BTL) OPTIME
TOPICAL | Status: DC | PRN
  Administered 2013-01-09: 1000 mL

## 2013-01-09 MED ORDER — ALBUTEROL SULFATE HFA 108 (90 BASE) MCG/ACT IN AERS
INHALATION_SPRAY | RESPIRATORY_TRACT | Status: DC | PRN
  Administered 2013-01-09: 5 via RESPIRATORY_TRACT

## 2013-01-09 MED ORDER — KETOROLAC TROMETHAMINE 30 MG/ML IJ SOLN
30.0000 mg | Freq: Once | INTRAMUSCULAR | Status: AC
  Administered 2013-01-09: 30 mg via INTRAVENOUS
  Filled 2013-01-09: qty 1

## 2013-01-09 MED ORDER — HYDROCODONE-ACETAMINOPHEN 5-325 MG PO TABS: 1.0000 | ORAL_TABLET | ORAL | Status: DC | PRN

## 2013-01-09 MED ORDER — GLYCOPYRROLATE 0.2 MG/ML IJ SOLN
0.2000 mg | Freq: Once | INTRAMUSCULAR | Status: AC
  Administered 2013-01-09: 0.2 mg via INTRAVENOUS
  Filled 2013-01-09: qty 1

## 2013-01-09 MED ORDER — CEFAZOLIN SODIUM-DEXTROSE 2-3 GM-% IV SOLR
INTRAVENOUS | Status: AC
  Filled 2013-01-09: qty 50

## 2013-01-09 MED ORDER — EPHEDRINE SULFATE 50 MG/ML IJ SOLN
INTRAMUSCULAR | Status: DC | PRN
  Administered 2013-01-09: 5 mg via INTRAVENOUS
  Administered 2013-01-09: 10 mg via INTRAVENOUS

## 2013-01-09 MED ORDER — BUPIVACAINE HCL (PF) 0.5 % IJ SOLN
INTRAMUSCULAR | Status: DC | PRN
  Administered 2013-01-09: 9 mL
  Administered 2013-01-09: 10 mL

## 2013-01-09 MED ORDER — SUCCINYLCHOLINE CHLORIDE 20 MG/ML IJ SOLN
INTRAMUSCULAR | Status: AC
  Filled 2013-01-09: qty 1

## 2013-01-09 MED ORDER — FENTANYL CITRATE 0.05 MG/ML IJ SOLN: 25.0000 ug | INTRAMUSCULAR | Status: DC | PRN

## 2013-01-09 MED ORDER — BUPIVACAINE HCL (PF) 0.5 % IJ SOLN
INTRAMUSCULAR | Status: AC
  Filled 2013-01-09: qty 30

## 2013-01-09 MED ORDER — SODIUM CHLORIDE BACTERIOSTATIC 0.9 % IJ SOLN
INTRAMUSCULAR | Status: AC
  Filled 2013-01-09: qty 10

## 2013-01-09 MED ORDER — ALBUTEROL SULFATE HFA 108 (90 BASE) MCG/ACT IN AERS
INHALATION_SPRAY | RESPIRATORY_TRACT | Status: AC
  Filled 2013-01-09: qty 6.7

## 2013-01-09 MED ORDER — LACTATED RINGERS IV SOLN
INTRAVENOUS | Status: DC
  Administered 2013-01-09: 07:00:00 via INTRAVENOUS

## 2013-01-09 MED ORDER — FENTANYL CITRATE 0.05 MG/ML IJ SOLN
INTRAMUSCULAR | Status: DC | PRN
  Administered 2013-01-09: 50 ug via INTRAVENOUS
  Administered 2013-01-09: 100 ug via INTRAVENOUS
  Administered 2013-01-09: 50 ug via INTRAVENOUS

## 2013-01-09 MED ORDER — LACTATED RINGERS IV SOLN
INTRAVENOUS | Status: DC | PRN
  Administered 2013-01-09 (×2): via INTRAVENOUS

## 2013-01-09 SURGICAL SUPPLY — 29 items
BAG HAMPER (MISCELLANEOUS) ×2 IMPLANT
CLOTH BEACON ORANGE TIMEOUT ST (SAFETY) ×2 IMPLANT
COVER LIGHT HANDLE STERIS (MISCELLANEOUS) ×4 IMPLANT
DECANTER SPIKE VIAL GLASS SM (MISCELLANEOUS) ×2 IMPLANT
DERMABOND ADVANCED (GAUZE/BANDAGES/DRESSINGS) ×2
DERMABOND ADVANCED .7 DNX12 (GAUZE/BANDAGES/DRESSINGS) ×2 IMPLANT
DURAPREP 26ML APPLICATOR (WOUND CARE) ×4 IMPLANT
ELECT NEEDLE TIP 2.8 STRL (NEEDLE) IMPLANT
ELECT REM PT RETURN 9FT ADLT (ELECTROSURGICAL) ×2
ELECTRODE REM PT RTRN 9FT ADLT (ELECTROSURGICAL) ×1 IMPLANT
FORMALIN 10 PREFIL 120ML (MISCELLANEOUS) ×4 IMPLANT
GLOVE BIO SURGEON STRL SZ7.5 (GLOVE) ×2 IMPLANT
GLOVE BIOGEL PI IND STRL 7.0 (GLOVE) ×3 IMPLANT
GLOVE BIOGEL PI INDICATOR 7.0 (GLOVE) ×3
GLOVE OPTIFIT SS 6.5 STRL BRWN (GLOVE) ×2 IMPLANT
GOWN STRL REIN XL XLG (GOWN DISPOSABLE) ×4 IMPLANT
KIT ROOM TURNOVER APOR (KITS) ×2 IMPLANT
MANIFOLD NEPTUNE II (INSTRUMENTS) ×2 IMPLANT
NEEDLE HYPO 25X1 1.5 SAFETY (NEEDLE) ×2 IMPLANT
NS IRRIG 1000ML POUR BTL (IV SOLUTION) ×2 IMPLANT
PACK MINOR (CUSTOM PROCEDURE TRAY) ×2 IMPLANT
PAD ARMBOARD 7.5X6 YLW CONV (MISCELLANEOUS) ×2 IMPLANT
SET BASIN LINEN APH (SET/KITS/TRAYS/PACK) ×2 IMPLANT
SUT ETHILON 3 0 FSL (SUTURE) IMPLANT
SUT PROLENE 4 0 PS 2 18 (SUTURE) IMPLANT
SUT VIC AB 3-0 SH 27 (SUTURE) ×8
SUT VIC AB 3-0 SH 27X BRD (SUTURE) ×8 IMPLANT
SUT VIC AB 4-0 PS2 27 (SUTURE) ×4 IMPLANT
SYR CONTROL 10ML LL (SYRINGE) ×2 IMPLANT

## 2013-01-09 NOTE — Preoperative (Signed)
Beta Blockers   Reason not to administer Beta Blockers:Not Applicable 

## 2013-01-09 NOTE — Op Note (Signed)
Patient:  Kathryn Beck  DOB:  1955/05/03  MRN:  [id number]   Preop Diagnosis:  Soft tissue neoplasms, bilateral axillas  Postop Diagnosis:  Same  Procedure:  Excision of soft tissue neoplasms, bilateral axillas  Surgeon:  Aviva Signs, M.D.  Anes:  General endotracheal  Indications:  Patient is a 57 year old black female presents with enlarging bilateral subcutaneous soft tissue axillary masses. The risks and benefits of the procedure were fully explained to the patient, who gave informed consent.  Procedure note:  The patient is placed the supine position. After induction of general endotracheal anesthesia, the axillas were prepped and draped using usual sterile technique with DuraPrep. Surgical site confirmation was performed.   The right axilla was first approached. An elliptical incision was made around the soft tissue mass which measured greater than 6 cm in its greatest diameter in the right axilla. The incision did include skin in order to close the wound without skin flaps. The dissection was taken down to the subcutaneous tissue. A significant amount of adipose tissue was observed. The skin and subcutaneous tissue was well as deeper soft tissue were removed in total and sent to pathology further examination. A bleeding was controlled using Bovie electrocautery. The subcutaneous layer was reapproximated using 3-0 Vicryl interrupted suture. The skin was closed using a 4-0 Vicryl subcuticular suture. 0.5% Sensorcaine was instilled the surrounding wound. Dermabond was then applied.  The identical procedure was then performed on the left axilla.  The mass also measured greater than 6 cm in its greatest diameter.   All tape and needle counts were correct at the end of the procedures. The patient was extubated in the operating room and transferred to PACU in stable condition.  Complications:  None  EBL:  Minimal  Specimen:  Right axillary soft tissue mass, left axillary soft tissue  mass

## 2013-01-09 NOTE — Interval H&P Note (Signed)
History and Physical Interval Note:  01/09/2013 7:25 AM  Kathryn Beck  has presented today for surgery, with the diagnosis of soft tissue mass bilateral axillas  The various methods of treatment have been discussed with the patient and family. After consideration of risks, benefits and other options for treatment, the patient has consented to  Procedure(s): EXCISION OF NEOPLASM OF BILATERAL AXILLAS (Bilateral) as a surgical intervention .  The patient's history has been reviewed, patient examined, no change in status, stable for surgery.  I have reviewed the patient's chart and labs.  Questions were answered to the patient's satisfaction.     Aviva Signs A

## 2013-01-09 NOTE — Transfer of Care (Signed)
Immediate Anesthesia Transfer of Care Note  Patient: Kathryn Beck  Procedure(s) Performed: Procedure(s) with comments: EXCISION OF NEOPLASM OF RIGHT  AXILLA  AND EXCISION OF NEOPLASM OF LEFT AXILLA (Right) - procedure end @ 08:23  Patient Location: PACU  Anesthesia Type:General  Level of Consciousness: sedated and patient cooperative  Airway & Oxygen Therapy: Patient Spontanous Breathing and Patient connected to face mask oxygen  Post-op Assessment: Report given to PACU RN and Post -op Vital signs reviewed and stable  Post vital signs: Reviewed and stable  Complications: No apparent anesthesia complications

## 2013-01-09 NOTE — Anesthesia Procedure Notes (Signed)
Procedure Name: Intubation Date/Time: 01/09/2013 7:45 AM Performed by: Antony Contras, AMY L Pre-anesthesia Checklist: Patient identified, Patient being monitored, Timeout performed, Emergency Drugs available and Suction available Patient Re-evaluated:Patient Re-evaluated prior to inductionOxygen Delivery Method: Circle System Utilized Preoxygenation: Pre-oxygenation with 100% oxygen Intubation Type: IV induction, Rapid sequence and Cricoid Pressure applied Grade View: Grade I Tube type: Oral Tube size: 7.0 mm Number of attempts: 1 Airway Equipment and Method: stylet and Video-laryngoscopy Placement Confirmation: ETT inserted through vocal cords under direct vision,  positive ETCO2 and breath sounds checked- equal and bilateral Secured at: 21 cm Tube secured with: Tape Dental Injury: Teeth and Oropharynx as per pre-operative assessment  Difficulty Due To: Difficulty was anticipated

## 2013-01-09 NOTE — Anesthesia Preprocedure Evaluation (Signed)
Anesthesia Evaluation  Patient identified by MRN, date of birth, ID band Patient awake    Reviewed: Allergy & Precautions, H&P , NPO status , Patient's Chart, lab work & pertinent test results  Airway Mallampati: III TM Distance: >3 FB Neck ROM: Full    Dental  (+) Teeth Intact   Pulmonary sleep apnea ,  breath sounds clear to auscultation        Cardiovascular hypertension, Pt. on medications Rhythm:Regular Rate:Normal     Neuro/Psych    GI/Hepatic GERD-  Medicated,  Endo/Other  diabetes, Type 2Hyperthyroidism Morbid obesity  Renal/GU Renal disease     Musculoskeletal   Abdominal   Peds  Hematology   Anesthesia Other Findings   Reproductive/Obstetrics                           Anesthesia Physical Anesthesia Plan  ASA: III  Anesthesia Plan: General   Post-op Pain Management:    Induction: Intravenous, Rapid sequence and Cricoid pressure planned  Airway Management Planned: Oral ETT  Additional Equipment:   Intra-op Plan:   Post-operative Plan: Extubation in OR  Informed Consent: I have reviewed the patients History and Physical, chart, labs and discussed the procedure including the risks, benefits and alternatives for the proposed anesthesia with the patient or authorized representative who has indicated his/her understanding and acceptance.     Plan Discussed with:   Anesthesia Plan Comments:         Anesthesia Quick Evaluation

## 2013-01-09 NOTE — Anesthesia Postprocedure Evaluation (Signed)
  Anesthesia Post-op Note  Patient: Kathryn Beck  Procedure(s) Performed: Procedure(s) with comments: EXCISION OF NEOPLASM OF RIGHT  AXILLA  AND EXCISION OF NEOPLASM OF LEFT AXILLA (Right) - procedure end @ 08:23  Patient Location: PACU  Anesthesia Type:General  Level of Consciousness: sedated and patient cooperative  Airway and Oxygen Therapy: Patient Spontanous Breathing and Patient connected to face mask oxygen  Post-op Pain: mild  Post-op Assessment: Post-op Vital signs reviewed, Patient's Cardiovascular Status Stable, Respiratory Function Stable, Patent Airway, No signs of Nausea or vomiting and Pain level controlled  Post-op Vital Signs: Reviewed and stable  Complications: No apparent anesthesia complications

## 2013-01-10 ENCOUNTER — Encounter (HOSPITAL_COMMUNITY): Payer: Self-pay | Admitting: General Surgery

## 2013-01-15 ENCOUNTER — Encounter (HOSPITAL_COMMUNITY): Payer: Self-pay | Admitting: Emergency Medicine

## 2013-01-15 ENCOUNTER — Emergency Department (HOSPITAL_COMMUNITY)
Admission: EM | Admit: 2013-01-15 | Discharge: 2013-01-15 | Disposition: A | Discharge status: Home / Self Care | Payer: 59 | Attending: Emergency Medicine | Admitting: Emergency Medicine

## 2013-01-15 DIAGNOSIS — E669 Obesity, unspecified: Secondary | ICD-10-CM | POA: Insufficient documentation

## 2013-01-15 DIAGNOSIS — E119 Type 2 diabetes mellitus without complications: Secondary | ICD-10-CM | POA: Insufficient documentation

## 2013-01-15 DIAGNOSIS — D649 Anemia, unspecified: Secondary | ICD-10-CM | POA: Insufficient documentation

## 2013-01-15 DIAGNOSIS — Z79899 Other long term (current) drug therapy: Secondary | ICD-10-CM | POA: Insufficient documentation

## 2013-01-15 DIAGNOSIS — G4730 Sleep apnea, unspecified: Secondary | ICD-10-CM | POA: Insufficient documentation

## 2013-01-15 DIAGNOSIS — F411 Generalized anxiety disorder: Secondary | ICD-10-CM | POA: Insufficient documentation

## 2013-01-15 DIAGNOSIS — I10 Essential (primary) hypertension: Secondary | ICD-10-CM | POA: Insufficient documentation

## 2013-01-15 DIAGNOSIS — Y838 Other surgical procedures as the cause of abnormal reaction of the patient, or of later complication, without mention of misadventure at the time of the procedure: Secondary | ICD-10-CM | POA: Insufficient documentation

## 2013-01-15 DIAGNOSIS — Z794 Long term (current) use of insulin: Secondary | ICD-10-CM | POA: Insufficient documentation

## 2013-01-15 DIAGNOSIS — Z8742 Personal history of other diseases of the female genital tract: Secondary | ICD-10-CM | POA: Insufficient documentation

## 2013-01-15 DIAGNOSIS — Z8639 Personal history of other endocrine, nutritional and metabolic disease: Secondary | ICD-10-CM | POA: Insufficient documentation

## 2013-01-15 DIAGNOSIS — IMO0002 Reserved for concepts with insufficient information to code with codable children: Secondary | ICD-10-CM | POA: Insufficient documentation

## 2013-01-15 DIAGNOSIS — T888XXA Other specified complications of surgical and medical care, not elsewhere classified, initial encounter: Secondary | ICD-10-CM

## 2013-01-15 DIAGNOSIS — Z880 Allergy status to penicillin: Secondary | ICD-10-CM | POA: Insufficient documentation

## 2013-01-15 DIAGNOSIS — Z862 Personal history of diseases of the blood and blood-forming organs and certain disorders involving the immune mechanism: Secondary | ICD-10-CM | POA: Insufficient documentation

## 2013-01-15 NOTE — ED Notes (Signed)
Has surgery to removed abscesses under both arms Monday. States one under right arm has split open and is bleeding.

## 2013-01-15 NOTE — ED Notes (Signed)
MD at bedside. 

## 2013-01-15 NOTE — ED Provider Notes (Signed)
CSN: [id number]     Arrival date & time 01/15/13  1032 History  This chart was scribed for Babette Relic, MD by Maree Erie, ED Scribe. The patient was seen in room APA12/[facility]12. Patient's care was started at 10:41 AM.     Chief Complaint  Patient presents with  . Wound Check    The history is provided by the patient. No language interpreter was used.    HPI Comments: Kathryn Beck is a 57 y.o. female who presents to the Emergency Department for a wound check. She had outpatient surgery in [place] on 10/20 for painful bilateral axillary lipomas. Today she has noticed bleeding and slight superficial separation of the right armpit incision site. She came to the ED to get it checked as directed by her surgeon.  She has no fever no surrounding redness no pus drainage no foul odor from her incisions.    Past Medical History  Diagnosis Date  . Diabetes mellitus   . Hypertension   . Back pain   . Anemia   . Hyperthyroidism   . Obesity   . Breast mass, left   . Sleep apnea     supposed to wear CPAP, but doesn't   Past Surgical History  Procedure Laterality Date  . Abdominal hysterectomy    . Lung biopsy    . Foot surgery      bilateral   . Mass excision Right 01/09/2013    Procedure: EXCISION OF NEOPLASM OF RIGHT  AXILLA  AND EXCISION OF NEOPLASM OF LEFT AXILLA;  Surgeon: Jamesetta So, MD;  Location: [facility];  Service: General;  Laterality: Right;  procedure end @ 08:23   History reviewed. No pertinent family history. History  Substance Use Topics  . Smoking status: Never Smoker   . Smokeless tobacco: Not on file  . Alcohol Use: No   OB History   Grav Para Term Preterm Abortions TAB SAB Ect Mult Living                 Review of Systems 10 Systems reviewed and are negative for acute change except as noted in the HPI  Allergies  Ace inhibitors; Penicillins; and Statins  Home Medications   Current Outpatient Rx  Name  Route  Sig  Dispense  Refill  . amLODipine  (NORVASC) 10 MG tablet   Oral   Take 1 tablet (10 mg total) by mouth daily.   90 tablet   1   . cholecalciferol (VITAMIN D) 1000 UNITS tablet   Oral   Take 2,000 Units by mouth daily.         . cyclobenzaprine (FLEXERIL) 10 MG tablet   Oral   Take 1 tablet (10 mg total) by mouth at bedtime as needed for muscle spasms.   90 tablet   1   . hydrOXYzine (ATARAX/VISTARIL) 25 MG tablet   Oral   Take 25 mg by mouth 3 (three) times daily as needed for itching.         . insulin detemir (LEVEMIR) 100 UNIT/ML injection   Subcutaneous   Inject 40 Units into the skin at bedtime.          . insulin lispro (HUMALOG) 100 UNIT/ML injection   Subcutaneous   Inject 10 Units into the skin See admin instructions. Twice daily as needed before meals. Patient uses according to sliding scale at home         . omeprazole (PRILOSEC) 20 MG capsule   Oral  Take 20 mg by mouth daily.         Marland Kitchen terbinafine (LAMISIL) 250 MG tablet   Oral   Take 1 tablet (250 mg total) by mouth daily.   30 tablet   3   . triamterene-hydrochlorothiazide (MAXZIDE) 75-50 MG per tablet   Oral   Take 1 tablet by mouth daily.   90 tablet   2     Dose increase effective 03/02/2012. Discontinue m ...   . valsartan (DIOVAN) 320 MG tablet   Oral   Take 1 tablet (320 mg total) by mouth daily.   90 tablet   2   . albuterol (PROVENTIL HFA;VENTOLIN HFA) 108 (90 BASE) MCG/ACT inhaler   Inhalation   Inhale 1-2 puffs into the lungs every 6 (six) hours as needed for wheezing.   3 Inhaler   2   . cloNIDine (CATAPRES) 0.1 MG tablet   Oral   Take 0.1 mg by mouth at bedtime.         Marland Kitchen HYDROcodone-acetaminophen (NORCO/VICODIN) 5-325 MG per tablet   Oral   Take 1-2 tablets by mouth every 4 (four) hours as needed for pain.   40 tablet   0    Triage Vitals: BP 148/73  Pulse 102  Temp(Src) 98.8 F (37.1 C) (Oral)  Resp 18  SpO2 99%  Physical Exam  Nursing note and vitals reviewed. Constitutional:   Awake, alert, nontoxic appearance.  HENT:  Head: Atraumatic.  Eyes: Right eye exhibits no discharge. Left eye exhibits no discharge.  Neck: Neck supple.  Cardiovascular: Normal rate and regular rhythm.   No murmur heard. Pulmonary/Chest: Effort normal and breath sounds normal. No respiratory distress. She has no wheezes. She has no rales. She exhibits no tenderness.  Abdominal: Soft. There is no tenderness. There is no rebound.  Musculoskeletal: She exhibits no tenderness.  Baseline ROM, no obvious new focal weakness.Right axilla incision has a 1 cm area of superficial dehiscence with slight constant oozing of blood. No tenderness, no surrounding erythema, no purulence drainage, no full-thickness dehiscence.   Neurological:  Mental status and motor strength appears baseline for patient and situation.  Skin: No rash noted.  Psychiatric: She has a normal mood and affect.    ED Course  Procedures (including critical care time)  DIAGNOSTIC STUDIES: Oxygen Saturation is 99% on room air, normal by my interpretation.    COORDINATION OF CARE: 10:46 AM -Placed QR powder on slightly bleeding right incision site. Patient verbalizes understanding and agrees with treatment plan. QR powder applied with good hemostasis. Patient tolerated this well with no apparent immediate complications. 11:30 AM -Bleeding controlled with QR powder. Will discharge patient. Patient verbalizes understanding and agrees with treatment plan.      Labs Review Labs Reviewed - No data to display Imaging Review No results found.  EKG Interpretation   None       MDM   1. Post-op bleeding, initial encounter    I doubt any other EMC precluding discharge at this time including, but not necessarily limited to the following:wound infection.  I personally performed the services described in this documentation, which was scribed in my presence. The recorded information has been reviewed and is  accurate.    Babette Relic, MD 01/22/13 2136

## 2013-03-08 ENCOUNTER — Telehealth: Payer: Self-pay | Admitting: Family Medicine

## 2013-03-08 NOTE — Telephone Encounter (Signed)
Called patient to work in- left message

## 2013-03-08 NOTE — Telephone Encounter (Signed)
Work in [employer], pls let her know am appt

## 2013-03-09 ENCOUNTER — Encounter (INDEPENDENT_AMBULATORY_CARE_PROVIDER_SITE_OTHER): Payer: Self-pay

## 2013-03-09 ENCOUNTER — Ambulatory Visit (INDEPENDENT_AMBULATORY_CARE_PROVIDER_SITE_OTHER): Payer: 59 | Admitting: Family Medicine

## 2013-03-09 ENCOUNTER — Encounter: Payer: Self-pay | Admitting: Family Medicine

## 2013-03-09 VITALS — BP 112/74 | HR 84 | Temp 98.4°F | Resp 16 | Ht 66.0 in | Wt 204.8 lb

## 2013-03-09 DIAGNOSIS — E1129 Type 2 diabetes mellitus with other diabetic kidney complication: Secondary | ICD-10-CM

## 2013-03-09 DIAGNOSIS — I10 Essential (primary) hypertension: Secondary | ICD-10-CM

## 2013-03-09 DIAGNOSIS — J309 Allergic rhinitis, unspecified: Secondary | ICD-10-CM

## 2013-03-09 DIAGNOSIS — IMO0002 Reserved for concepts with insufficient information to code with codable children: Secondary | ICD-10-CM

## 2013-03-09 DIAGNOSIS — A499 Bacterial infection, unspecified: Secondary | ICD-10-CM

## 2013-03-09 DIAGNOSIS — E785 Hyperlipidemia, unspecified: Secondary | ICD-10-CM

## 2013-03-09 DIAGNOSIS — J302 Other seasonal allergic rhinitis: Secondary | ICD-10-CM

## 2013-03-09 DIAGNOSIS — N058 Unspecified nephritic syndrome with other morphologic changes: Secondary | ICD-10-CM

## 2013-03-09 DIAGNOSIS — E669 Obesity, unspecified: Secondary | ICD-10-CM

## 2013-03-09 DIAGNOSIS — B9689 Other specified bacterial agents as the cause of diseases classified elsewhere: Secondary | ICD-10-CM | POA: Insufficient documentation

## 2013-03-09 DIAGNOSIS — J209 Acute bronchitis, unspecified: Secondary | ICD-10-CM

## 2013-03-09 MED ORDER — IPRATROPIUM BROMIDE 0.02 % IN SOLN
0.5000 mg | Freq: Once | RESPIRATORY_TRACT | Status: AC
  Administered 2013-03-09: 0.5 mg via RESPIRATORY_TRACT

## 2013-03-09 MED ORDER — LEVOFLOXACIN 500 MG PO TABS: 500.0000 mg | ORAL_TABLET | Freq: Every day | ORAL | Status: DC

## 2013-03-09 MED ORDER — METHYLPREDNISOLONE ACETATE 80 MG/ML IJ SUSP
80.0000 mg | Freq: Once | INTRAMUSCULAR | Status: AC
  Administered 2013-03-09: 80 mg via INTRAMUSCULAR

## 2013-03-09 MED ORDER — ALBUTEROL SULFATE (2.5 MG/3ML) 0.083% IN NEBU
2.5000 mg | INHALATION_SOLUTION | Freq: Once | RESPIRATORY_TRACT | Status: AC
  Administered 2013-03-09: 2.5 mg via RESPIRATORY_TRACT

## 2013-03-09 MED ORDER — BENZONATATE 100 MG PO CAPS: 100.0000 mg | ORAL_CAPSULE | Freq: Three times a day (TID) | ORAL | Status: DC | PRN

## 2013-03-09 MED ORDER — LORATADINE 10 MG PO TABS: 10.0000 mg | ORAL_TABLET | Freq: Every day | ORAL | Status: DC | PRN

## 2013-03-09 MED ORDER — FLUCONAZOLE 150 MG PO TABS: ORAL_TABLET | ORAL | Status: DC

## 2013-03-09 MED ORDER — PREDNISONE 5 MG PO TABS: 5.0000 mg | ORAL_TABLET | Freq: Two times a day (BID) | ORAL | Status: DC

## 2013-03-09 NOTE — Telephone Encounter (Signed)
Patient seen in office today. 

## 2013-03-09 NOTE — Patient Instructions (Signed)
F/u as before, if no appt in 3 month, pls sched  You are treated for acute bacterial bronchitis and sinusitis and uncontrolled allergies  Neb treatment and depo medrol 80mg  IM in the office and medication  Is sent in

## 2013-03-09 NOTE — Progress Notes (Signed)
   Subjective:    Patient ID: Kathryn Beck, female    DOB: April 07, 1955, 57 y.o.   MRN: [id number]  HPI  4 day h/o increased head congestion , genneralized aches and pains, fever and chills, sneezing , tight chest  Review of Systems     Objective:   Physical Exam        Assessment & Plan:

## 2013-03-10 ENCOUNTER — Telehealth: Payer: Self-pay | Admitting: Family Medicine

## 2013-03-10 ENCOUNTER — Other Ambulatory Visit: Payer: Self-pay

## 2013-03-10 DIAGNOSIS — J302 Other seasonal allergic rhinitis: Secondary | ICD-10-CM | POA: Insufficient documentation

## 2013-03-10 DIAGNOSIS — B9689 Other specified bacterial agents as the cause of diseases classified elsewhere: Secondary | ICD-10-CM

## 2013-03-10 MED ORDER — BENZONATATE 100 MG PO CAPS: 100.0000 mg | ORAL_CAPSULE | Freq: Three times a day (TID) | ORAL | Status: AC | PRN

## 2013-03-10 MED ORDER — FLUCONAZOLE 150 MG PO TABS: ORAL_TABLET | ORAL | Status: AC

## 2013-03-10 MED ORDER — PREDNISONE 5 MG PO TABS: 5.0000 mg | ORAL_TABLET | Freq: Two times a day (BID) | ORAL | Status: AC

## 2013-03-10 MED ORDER — LEVOFLOXACIN 500 MG PO TABS: 500.0000 mg | ORAL_TABLET | Freq: Every day | ORAL | Status: AC

## 2013-03-10 NOTE — Assessment & Plan Note (Signed)
Controlled, no change in medication DASH diet and commitment to daily physical activity for a minimum of 30 minutes discussed and encouraged, as a part of hypertension management. The importance of attaining a healthy weight is also discussed.  

## 2013-03-10 NOTE — Assessment & Plan Note (Signed)
Unchanged. Patient re-educated about  the importance of commitment to a  minimum of 150 minutes of exercise per week. The importance of healthy food choices with portion control discussed. Encouraged to start a food diary, count calories and to consider  joining a support group. Sample diet sheets offered. Goals set by the patient for the next several months.    

## 2013-03-10 NOTE — Assessment & Plan Note (Signed)
Uncontrolled, needs a trial of a statin will call pt to discuss this Hyperlipidemia:Low fat diet discussed and encouraged.

## 2013-03-10 NOTE — Assessment & Plan Note (Signed)
Improving and managed by endo

## 2013-03-10 NOTE — Telephone Encounter (Signed)
Pls call pt ask her if willing to try lowest dose of pravachol 10mg  for her cholesterol, since it is extremely high. I know in past her LFT's have gone up but I think it is worth a try. If she agrees pls send in the pravachol 10mg  tab entered. She will need non fasting LFT 4 weeks adfter startinng , then fasting lipid and hepatic in 3 months, if she does take the med, these will need ordering I believe

## 2013-03-10 NOTE — Assessment & Plan Note (Signed)
Neb treatment and dep omedrol followed by prednisone, decongestant and antibiotic

## 2013-03-10 NOTE — Assessment & Plan Note (Signed)
Uncontrolled, with increased symptoms,  Dep medrol then prednisone dose pack and daily allergy meds with saline nasal flush

## 2013-03-31 ENCOUNTER — Other Ambulatory Visit: Payer: Self-pay

## 2013-03-31 ENCOUNTER — Other Ambulatory Visit: Payer: Self-pay | Admitting: Family Medicine

## 2013-03-31 DIAGNOSIS — E785 Hyperlipidemia, unspecified: Secondary | ICD-10-CM

## 2013-03-31 LAB — COMPLETE METABOLIC PANEL WITH GFR
ALT: 24 U/L (ref 0–35)
AST: 18 U/L (ref 0–37)
Albumin: 4 g/dL (ref 3.5–5.2)
Alkaline Phosphatase: 78 U/L (ref 39–117)
BUN: 23 mg/dL (ref 6–23)
CO2: 27 mEq/L (ref 19–32)
Calcium: 9.6 mg/dL (ref 8.4–10.5)
Chloride: 101 mEq/L (ref 96–112)
Creat: 1.18 mg/dL — ABNORMAL HIGH (ref 0.50–1.10)
GFR, Est African American: 60 mL/min
GFR, Est Non African American: 52 mL/min — ABNORMAL LOW
Glucose, Bld: 167 mg/dL — ABNORMAL HIGH (ref 70–99)
Potassium: 4.4 mEq/L (ref 3.5–5.3)
Sodium: 139 mEq/L (ref 135–145)
Total Bilirubin: 0.3 mg/dL (ref 0.3–1.2)
Total Protein: 7.2 g/dL (ref 6.0–8.3)

## 2013-03-31 LAB — LIPID PANEL
Cholesterol: 301 mg/dL — ABNORMAL HIGH (ref 0–200)
HDL: 49 mg/dL (ref >39)
LDL Cholesterol: 191 mg/dL — ABNORMAL HIGH (ref 0–99)
Total CHOL/HDL Ratio: 6.1 Ratio
Triglycerides: 306 mg/dL — ABNORMAL HIGH (ref <150)
VLDL: 61 mg/dL — ABNORMAL HIGH (ref 0–40)

## 2013-03-31 MED ORDER — PRAVASTATIN SODIUM 10 MG PO TABS: 10.0000 mg | ORAL_TABLET | Freq: Every day | ORAL | Status: DC

## 2013-03-31 NOTE — Telephone Encounter (Signed)
Late response.  Spoke with patient who states that she is willing to try medicine.  Medicine sent the requested pharmacy.  She has and appointment on 04/03/2013.  She is having her labs drawn today.

## 2013-04-03 ENCOUNTER — Ambulatory Visit (INDEPENDENT_AMBULATORY_CARE_PROVIDER_SITE_OTHER): Payer: 59 | Admitting: Family Medicine

## 2013-04-03 ENCOUNTER — Encounter: Payer: Self-pay | Admitting: Family Medicine

## 2013-04-03 VITALS — BP 122/78 | HR 81 | Resp 16 | Ht 66.0 in | Wt 206.0 lb

## 2013-04-03 DIAGNOSIS — R252 Cramp and spasm: Secondary | ICD-10-CM

## 2013-04-03 DIAGNOSIS — E1129 Type 2 diabetes mellitus with other diabetic kidney complication: Secondary | ICD-10-CM

## 2013-04-03 DIAGNOSIS — I10 Essential (primary) hypertension: Secondary | ICD-10-CM

## 2013-04-03 DIAGNOSIS — E669 Obesity, unspecified: Secondary | ICD-10-CM

## 2013-04-03 DIAGNOSIS — R290 Tetany: Secondary | ICD-10-CM

## 2013-04-03 DIAGNOSIS — E785 Hyperlipidemia, unspecified: Secondary | ICD-10-CM

## 2013-04-03 DIAGNOSIS — IMO0002 Reserved for concepts with insufficient information to code with codable children: Secondary | ICD-10-CM

## 2013-04-03 DIAGNOSIS — E1165 Type 2 diabetes mellitus with hyperglycemia: Secondary | ICD-10-CM

## 2013-04-03 LAB — MAGNESIUM: Magnesium: 1.8 mg/dL (ref 1.5–2.5)

## 2013-04-03 NOTE — Patient Instructions (Signed)
CPE in early my, call if you need me before  Blood pressure is excellent  Cholesterol VERY high, no butter, margerine or cooking oils please, and take medication every day as prescribed  It is important that you exercise regularly at least 30 minutes 5 times a week. If you develop chest pain, have severe difficulty breathing, or feel very tired, stop exercising immediately and seek medical attention    A healthy diet is rich in fruit, vegetables and whole grains. Poultry fish, nuts and beans are a healthy choice for protein rather then red meat. A low sodium diet and drinking 64 ounces of water daily is generally recommended. Oils and sweet should be limited. Carbohydrates especially for those who are diabetic or overweight, should be limited to 60-45 gram per meal. It is important to eat on a regular schedule, at least 3 times daily. Snacks should be primarily fruits, vegetables or nuts.  Fasting lipid cmp and eGFr in May before visit  Calcium and magnesium levels will be checked and you will be called with result, trial of OTC potassium 23m tablet and OTC calcium /magnesium tablet one daily may help with the cramps

## 2013-04-30 NOTE — Assessment & Plan Note (Signed)
Severely elevated. Hyperlipidemia:Low fat diet discussed and encouraged.

## 2013-04-30 NOTE — Assessment & Plan Note (Signed)
Ongoing spasms, check magnesium level

## 2013-04-30 NOTE — Assessment & Plan Note (Signed)
Deteriorated. Patient re-educated about  the importance of commitment to a  minimum of 150 minutes of exercise per week. The importance of healthy food choices with portion control discussed. Encouraged to start a food diary, count calories and to consider  joining a support group. Sample diet sheets offered. Goals set by the patient for the next several months.    

## 2013-04-30 NOTE — Progress Notes (Signed)
   Subjective:    Patient ID: Kathryn Beck, female    DOB: 09-05-1955, 58 y.o.   MRN: [id number]  HPI The PT is here for follow up and re-evaluation of chronic medical conditions, medication management and review of any available recent lab and radiology data.  Preventive health is updated, specifically  Cancer screening and Immunization.   Questions or concerns regarding consultations or procedures which the PT has had in the interim are  Addressed.Has upcoming appts with dermatology, re discoid lupus, and also with endo for uncontrolled blood sugar The PT denies any adverse reactions to current medications since the last visit.  There are no new concerns. Pt has completely recovered from recent acute bronchitis, but continues to experience significant spasms in toes There are no specific complaints       Review of Systems See HPI Denies recent fever or chills. Denies sinus pressure, nasal congestion, ear pain or sore throat. Denies chest congestion, productive cough or wheezing. Denies chest pains, palpitations and leg swelling Denies abdominal pain, nausea, vomiting,diarrhea or constipation.   Denies dysuria, frequency, hesitancy or incontinence. Denies joint pain, swelling and limitation in mobility. Denies headaches, seizures, numbness, or tingling. Denies depression, anxiety or insomnia.       Objective:   Physical Exam  Patient alert and oriented and in no cardiopulmonary distress.  HEENT: No facial asymmetry, EOMI, no sinus tenderness,  oropharynx pink and moist.  Neck supple no adenopathy.  Chest: Clear to auscultation bilaterally.  CVS: S1, S2 no murmurs, no S3.  ABD: Soft non tender. Bowel sounds normal.  Ext: No edema  MS: Adequate ROM spine, shoulders, hips and knees.  Skin: Intact, no ulcerations or rash noted.  Psych: Good eye contact, normal affect. Memory intact not anxious or depressed appearing.  CNS: CN 2-12 intact, power, tone and sensation  normal throughout.       Assessment & Plan:  HYPERTENSION Controlled, no change in medication DASH diet and commitment to daily physical activity for a minimum of 30 minutes discussed and encouraged, as a part of hypertension management. The importance of attaining a healthy weight is also discussed.   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   HYPERLIPIDEMIA Severely elevated. Hyperlipidemia:Low fat diet discussed and encouraged.    OBESITY Deteriorated. Patient re-educated about  the importance of commitment to a  minimum of 150 minutes of exercise per week. The importance of healthy food choices with portion control discussed. Encouraged to start a food diary, count calories and to consider  joining a support group. Sample diet sheets offered. Goals set by the patient for the next several months.     Hand or foot spasms Ongoing spasms, check magnesium level

## 2013-04-30 NOTE — Assessment & Plan Note (Signed)
Controlled, no change in medication DASH diet and commitment to daily physical activity for a minimum of 30 minutes discussed and encouraged, as a part of hypertension management. The importance of attaining a healthy weight is also discussed.  

## 2013-04-30 NOTE — Assessment & Plan Note (Signed)
Followed by endo and still not at goal based on scanned labs , Patient advised to reduce carb and sweets, commit to regular physical activity, take meds as prescribed, test blood as directed, and attempt to lose weight, to improve blood sugar control.

## 2013-05-02 ENCOUNTER — Other Ambulatory Visit: Payer: Self-pay | Admitting: Family Medicine

## 2013-05-03 ENCOUNTER — Other Ambulatory Visit: Payer: Self-pay | Admitting: Family Medicine

## 2013-05-22 ENCOUNTER — Telehealth: Payer: Self-pay | Admitting: Family Medicine

## 2013-05-22 NOTE — Telephone Encounter (Signed)
She was seeing Doc at [facility] so I thought she would go back there pls send her wherever she wants to go for the same skin prob already documented on original referral

## 2013-05-22 NOTE — Telephone Encounter (Signed)
Is there a reason why she was referred to [facility]? Wants to go to [place]. Ok to refer?

## 2013-05-30 ENCOUNTER — Telehealth: Payer: Self-pay | Admitting: Family Medicine

## 2013-05-30 NOTE — Telephone Encounter (Signed)
Patient is aware 

## 2013-07-31 ENCOUNTER — Encounter: Payer: Self-pay | Admitting: Family Medicine

## 2013-07-31 ENCOUNTER — Ambulatory Visit (INDEPENDENT_AMBULATORY_CARE_PROVIDER_SITE_OTHER): Payer: 59 | Admitting: Family Medicine

## 2013-07-31 ENCOUNTER — Encounter (INDEPENDENT_AMBULATORY_CARE_PROVIDER_SITE_OTHER): Payer: Self-pay

## 2013-07-31 VITALS — BP 180/82 | HR 94 | Temp 98.7°F | Resp 18 | Ht 66.0 in | Wt 205.0 lb

## 2013-07-31 DIAGNOSIS — J302 Other seasonal allergic rhinitis: Secondary | ICD-10-CM

## 2013-07-31 DIAGNOSIS — G4730 Sleep apnea, unspecified: Secondary | ICD-10-CM

## 2013-07-31 DIAGNOSIS — I10 Essential (primary) hypertension: Secondary | ICD-10-CM

## 2013-07-31 DIAGNOSIS — J309 Allergic rhinitis, unspecified: Secondary | ICD-10-CM

## 2013-07-31 MED ORDER — MONTELUKAST SODIUM 10 MG PO TABS: 10.0000 mg | ORAL_TABLET | Freq: Every day | ORAL | Status: DC

## 2013-07-31 MED ORDER — FLUTICASONE PROPIONATE 50 MCG/ACT NA SUSP: 2.0000 | Freq: Every day | NASAL | Status: DC

## 2013-07-31 MED ORDER — AZITHROMYCIN 250 MG PO TABS: ORAL_TABLET | ORAL | Status: DC

## 2013-07-31 NOTE — Patient Instructions (Addendum)
Physical exam in 4 to 6 weeks call if you need me before  Blood pressure is very high since you are missing tablets it is vital you take medication every day at same time  Try to get CPAP machine as soon as possible  You a re prescribed 2 medication s for uncontrolled allergies and a z pack

## 2013-07-31 NOTE — Progress Notes (Signed)
   Subjective:    Patient ID: Kathryn Beck, female    DOB: 12-13-1955, 58 y.o.   MRN: [id number]  HPI  1 week h/o intermittent sore throat, sneezing, diffuculty with speech, generalized aches, no cough, feels drained, has ahd intermittent chills, no documented fever. Wants to reschedule due to acute illness  Review of Systems See HPI . Denies chest congestion, productive cough or wheezing. Denies chest pains, palpitations and leg swelling Denies abdominal pain, nausea, vomiting,diarrhea or constipation.   Denies dysuria, frequency, hesitancy or incontinence. C/o generalized body aches and poor appetite since feeling ill Denies depression, anxiety or insomnia. States skin is improved, seeing dermatology in [place] who has been treating her for fungal infection and she states she was told she does not have lupus      Objective:   Physical Exam  BP 180/82  Pulse 94  Temp(Src) 98.7 F (37.1 C)  Resp 18  Ht 5\' 6"  (1.676 m)  Wt 205 lb 0.6 oz (93.006 kg)  BMI 33.11 kg/m2  SpO2 97% Patient alert and oriented and in no cardiopulmonary distress.  HEENT: No facial asymmetry, EOMI, frontal and maxillary sinus tenderness,  oropharynx pink and moist.  Neck supple no adenopathy.excessive clear nasal drainage, sneezing , watery eyes and erythema and edema of nasal mucosa  Chest: Clear to auscultation bilaterally.  CVS: S1, S2 no murmurs, no S3.  ABD: Soft non tender. Bowel sounds normal.  Ext: No edema  MS: Adequate ROM spine, shoulders, hips and knees.  CNS: CN 2-12 intact, power,  normal throughout.       Assessment & Plan:  Seasonal allergies Uncontrolled , pt tpo start daily medication and antibiotic also prescribed based on symptoms and high risk of infection as a diabetic  HYPERTENSION Uncontrolled, out of medication, pt to resume meds as prescribed, she understands  Sleep apnea Chronic fatigue, not using CPAP, needs to work on getting this, potential damage to heart  and lungs also discussed

## 2013-08-03 ENCOUNTER — Encounter: Payer: Self-pay | Admitting: Family Medicine

## 2013-08-03 ENCOUNTER — Ambulatory Visit (INDEPENDENT_AMBULATORY_CARE_PROVIDER_SITE_OTHER): Payer: 59 | Admitting: Family Medicine

## 2013-08-03 VITALS — BP 140/80 | HR 93 | Resp 18 | Ht 66.0 in | Wt 205.0 lb

## 2013-08-03 DIAGNOSIS — M25579 Pain in unspecified ankle and joints of unspecified foot: Secondary | ICD-10-CM

## 2013-08-03 DIAGNOSIS — M25571 Pain in right ankle and joints of right foot: Secondary | ICD-10-CM

## 2013-08-03 DIAGNOSIS — R7989 Other specified abnormal findings of blood chemistry: Secondary | ICD-10-CM

## 2013-08-03 DIAGNOSIS — E785 Hyperlipidemia, unspecified: Secondary | ICD-10-CM

## 2013-08-03 DIAGNOSIS — E790 Hyperuricemia without signs of inflammatory arthritis and tophaceous disease: Secondary | ICD-10-CM

## 2013-08-03 DIAGNOSIS — I10 Essential (primary) hypertension: Secondary | ICD-10-CM

## 2013-08-03 MED ORDER — METHYLPREDNISOLONE ACETATE 80 MG/ML IJ SUSP
80.0000 mg | Freq: Once | INTRAMUSCULAR | Status: AC
  Administered 2013-08-03: 80 mg via INTRAMUSCULAR

## 2013-08-03 MED ORDER — PREDNISONE 5 MG PO KIT: PACK | ORAL | Status: DC

## 2013-08-03 MED ORDER — IBUPROFEN 800 MG PO TABS: 800.0000 mg | ORAL_TABLET | Freq: Three times a day (TID) | ORAL | Status: DC

## 2013-08-03 MED ORDER — KETOROLAC TROMETHAMINE 60 MG/2ML IM SOLN
60.0000 mg | Freq: Once | INTRAMUSCULAR | Status: AC
  Administered 2013-08-03: 60 mg via INTRAMUSCULAR

## 2013-08-03 NOTE — Patient Instructions (Addendum)
F/u as before  Blood pressure improved today , not at goal likely due to pain  I believe you may have gout and am treating you for acute arthritic pain in right ankle  Injections in office and medication sent to your pharmacy  Call if no better next week you will need to see orthopedics

## 2013-08-04 DIAGNOSIS — E790 Hyperuricemia without signs of inflammatory arthritis and tophaceous disease: Secondary | ICD-10-CM | POA: Insufficient documentation

## 2013-08-04 DIAGNOSIS — M25579 Pain in unspecified ankle and joints of unspecified foot: Secondary | ICD-10-CM | POA: Insufficient documentation

## 2013-08-04 LAB — BASIC METABOLIC PANEL
BUN: 20 mg/dL (ref 6–23)
CHLORIDE: 97 meq/L (ref 96–112)
CO2: 26 meq/L (ref 19–32)
CREATININE: 1.22 mg/dL — AB (ref 0.50–1.10)
Calcium: 10.9 mg/dL — ABNORMAL HIGH (ref 8.4–10.5)
Glucose, Bld: 144 mg/dL — ABNORMAL HIGH (ref 70–99)
POTASSIUM: 4.2 meq/L (ref 3.5–5.3)
Sodium: 136 mEq/L (ref 135–145)

## 2013-08-04 LAB — URIC ACID: Uric Acid, Serum: 10.1 mg/dL — ABNORMAL HIGH (ref 2.4–7.0)

## 2013-08-04 MED ORDER — ALLOPURINOL 300 MG PO TABS: 300.0000 mg | ORAL_TABLET | Freq: Every day | ORAL | Status: DC

## 2013-08-04 NOTE — Assessment & Plan Note (Signed)
Acte painful warm joint with reduced mobility, no trauma, dorsum of foot also swollen, likely gout. Aggressive anti inflammatory use and check uric acid level. Reports this  As first episode

## 2013-08-04 NOTE — Assessment & Plan Note (Signed)
Chronic fatigue, not using CPAP, needs to work on getting this, potential damage to heart and lungs also discussed

## 2013-08-04 NOTE — Assessment & Plan Note (Signed)
Marked improve,ment , still mildly elevated, but pt in a lot of pain Continue current meds DASH diet and commitment to daily physical activity for a minimum of 30 minutes discussed and encouraged, as a part of hypertension management. The importance of attaining a healthy weight is also discussed.

## 2013-08-04 NOTE — Assessment & Plan Note (Signed)
uncontrolled Hyperlipidemia:Low fat diet discussed and encouraged.  Updated lab needed at/ before next visit.

## 2013-08-04 NOTE — Progress Notes (Signed)
   Subjective:    Patient ID: Kathryn Beck, female    DOB: Jun 11, 1955, 58 y.o.   MRN: [id number]  HPI 3 day h/o acute and progressive pain , swelling and warmth of right foot and ankle. No trauma . First episode. Barely able to weight bear. Now has her BP meds and this is much improved   Review of Systems See HPI Denies recent fever or chills. Denies sinus pressure, nasal congestion, ear pain or sore throat. Denies chest congestion, productive cough or wheezing. Denies chest pains, palpitations and leg swelling Denies abdominal pain, nausea, vomiting,diarrhea or constipation.   Denies dysuria, frequency, hesitancy or incontinence.  Denies headaches, seizures, numbness, or tingling. Denies depression, anxiety , has had  Insomnia due to pain         Objective:   Physical Exam  BP 140/80  Pulse 93  Resp 18  Ht 5\' 6"  (1.676 m)  Wt 205 lb (92.987 kg)  BMI 33.10 kg/m2  SpO2 98% Patient alert and oriented and in no cardiopulmonary distress.Pt in significant pain  HEENT: No facial asymmetry, EOMI, no sinus tenderness,  oropharynx pink and moist.  Neck supple no adenopathy.  Chest: Clear to auscultation bilaterally.  CVS: S1, S2 no murmurs, no S3.  ABD: Soft non tender. Bowel sounds normal.  Ext: No edema  MS: Adequate ROM spine, shoulders, hips and knees.Right ankle and foot tender , warm , red and swollen. Reduced ROM right ankle   Psych: Good eye contact, normal affect. Memory intact not anxious or depressed appearing.        Assessment & Plan:  Pain in joint, ankle and foot Acte painful warm joint with reduced mobility, no trauma, dorsum of foot also swollen, likely gout. Aggressive anti inflammatory use and check uric acid level. Reports this  As first episode  Hyperuricemia Will start allopurinol in next 7 days   HYPERTENSION Marked improve,ment , still mildly elevated, but pt in a lot of pain Continue current meds DASH diet and commitment to daily  physical activity for a minimum of 30 minutes discussed and encouraged, as a part of hypertension management. The importance of attaining a healthy weight is also discussed.   HYPERLIPIDEMIA uncontrolled Hyperlipidemia:Low fat diet discussed and encouraged.  Updated lab needed at/ before next visit.

## 2013-08-04 NOTE — Assessment & Plan Note (Signed)
Uncontrolled , pt tpo start daily medication and antibiotic also prescribed based on symptoms and high risk of infection as a diabetic

## 2013-08-04 NOTE — Assessment & Plan Note (Signed)
Uncontrolled, out of medication, pt to resume meds as prescribed, she understands

## 2013-08-04 NOTE — Assessment & Plan Note (Signed)
Will start allopurinol in next 7 days

## 2013-09-13 ENCOUNTER — Other Ambulatory Visit: Payer: Self-pay | Admitting: Family Medicine

## 2013-09-14 LAB — COMPLETE METABOLIC PANEL WITH GFR
ALK PHOS: 88 U/L (ref 39–117)
ALT: 28 U/L (ref 0–35)
AST: 30 U/L (ref 0–37)
Albumin: 4.3 g/dL (ref 3.5–5.2)
BILIRUBIN TOTAL: 0.3 mg/dL (ref 0.2–1.2)
BUN: 30 mg/dL — AB (ref 6–23)
CO2: 24 meq/L (ref 19–32)
CREATININE: 1.59 mg/dL — AB (ref 0.50–1.10)
Calcium: 10.1 mg/dL (ref 8.4–10.5)
Chloride: 100 mEq/L (ref 96–112)
GFR, EST AFRICAN AMERICAN: 41 mL/min — AB
GFR, EST NON AFRICAN AMERICAN: 36 mL/min — AB
GLUCOSE: 198 mg/dL — AB (ref 70–99)
Potassium: 4.6 mEq/L (ref 3.5–5.3)
Sodium: 137 mEq/L (ref 135–145)
Total Protein: 7.4 g/dL (ref 6.0–8.3)

## 2013-09-14 LAB — LIPID PANEL
CHOL/HDL RATIO: 6.4 ratio
CHOLESTEROL: 293 mg/dL — AB (ref 0–200)
HDL: 46 mg/dL (ref >39)
TRIGLYCERIDES: 402 mg/dL — AB (ref <150)

## 2013-09-14 LAB — LDL CHOLESTEROL, DIRECT: Direct LDL: 149 mg/dL — ABNORMAL HIGH

## 2013-09-18 ENCOUNTER — Other Ambulatory Visit (HOSPITAL_COMMUNITY)
Admission: RE | Admit: 2013-09-18 | Discharge: 2013-09-18 | Disposition: A | Discharge status: Home / Self Care | Payer: 59 | Source: Ambulatory Visit | Attending: Family Medicine | Admitting: Family Medicine

## 2013-09-18 ENCOUNTER — Ambulatory Visit (INDEPENDENT_AMBULATORY_CARE_PROVIDER_SITE_OTHER): Payer: 59 | Admitting: Family Medicine

## 2013-09-18 ENCOUNTER — Encounter: Payer: Self-pay | Admitting: Family Medicine

## 2013-09-18 VITALS — BP 160/82 | HR 84 | Resp 18 | Ht 66.0 in | Wt 201.0 lb

## 2013-09-18 DIAGNOSIS — E1165 Type 2 diabetes mellitus with hyperglycemia: Secondary | ICD-10-CM

## 2013-09-18 DIAGNOSIS — Z1151 Encounter for screening for human papillomavirus (HPV): Secondary | ICD-10-CM | POA: Insufficient documentation

## 2013-09-18 DIAGNOSIS — M5410 Radiculopathy, site unspecified: Secondary | ICD-10-CM | POA: Insufficient documentation

## 2013-09-18 DIAGNOSIS — E785 Hyperlipidemia, unspecified: Secondary | ICD-10-CM

## 2013-09-18 DIAGNOSIS — N183 Chronic kidney disease, stage 3 unspecified: Secondary | ICD-10-CM

## 2013-09-18 DIAGNOSIS — Z1211 Encounter for screening for malignant neoplasm of colon: Secondary | ICD-10-CM

## 2013-09-18 DIAGNOSIS — M549 Dorsalgia, unspecified: Secondary | ICD-10-CM

## 2013-09-18 DIAGNOSIS — E1129 Type 2 diabetes mellitus with other diabetic kidney complication: Secondary | ICD-10-CM

## 2013-09-18 DIAGNOSIS — IMO0002 Reserved for concepts with insufficient information to code with codable children: Secondary | ICD-10-CM

## 2013-09-18 DIAGNOSIS — Z01419 Encounter for gynecological examination (general) (routine) without abnormal findings: Secondary | ICD-10-CM | POA: Insufficient documentation

## 2013-09-18 DIAGNOSIS — Z124 Encounter for screening for malignant neoplasm of cervix: Secondary | ICD-10-CM

## 2013-09-18 DIAGNOSIS — I10 Essential (primary) hypertension: Secondary | ICD-10-CM

## 2013-09-18 DIAGNOSIS — Z0000 Encounter for general adult medical examination without abnormal findings: Secondary | ICD-10-CM

## 2013-09-18 LAB — POC HEMOCCULT BLD/STL (OFFICE/1-CARD/DIAGNOSTIC): Fecal Occult Blood, POC: NEGATIVE

## 2013-09-18 MED ORDER — GABAPENTIN 100 MG PO CAPS: 100.0000 mg | ORAL_CAPSULE | Freq: Three times a day (TID) | ORAL | Status: DC

## 2013-09-18 MED ORDER — HYDROCODONE-ACETAMINOPHEN 5-325 MG PO TABS: ORAL_TABLET | ORAL | Status: DC

## 2013-09-18 MED ORDER — SPIRONOLACTONE 25 MG PO TABS: 25.0000 mg | ORAL_TABLET | Freq: Two times a day (BID) | ORAL | Status: DC

## 2013-09-18 NOTE — Assessment & Plan Note (Signed)
Uncontrolled, add twice daily spironolactone DASH diet and commitment to daily physical activity for a minimum of 30 minutes discussed and encouraged, as a part of hypertension management. The importance of attaining a healthy weight is also discussed.

## 2013-09-18 NOTE — Patient Instructions (Addendum)
**Note De-Identified  Obfuscation** F/u in 4.5 weeks, call if you need me before  New additional medication for bP is spironolactone twice daily, continue current medication  Xray of low back, new for back pain is gabapentin take one at bedtime , inc every 3 days up to 3 at bedtime if needed for pain. Script says one 3 times daily, take only at bedtime  Vicodin for short term use for back pain and xray of back today  You are referred  To kidney specialist for kidney disease  Please join diabetic class   Non fasting chem 7 and EGFR July 13  Pls take cholesterol medication and change eating  Foot exam today is good

## 2013-09-19 ENCOUNTER — Ambulatory Visit (HOSPITAL_COMMUNITY)
Admission: RE | Admit: 2013-09-19 | Discharge: 2013-09-19 | Disposition: A | Discharge status: Home / Self Care | Payer: 59 | Source: Ambulatory Visit | Attending: Family Medicine | Admitting: Family Medicine

## 2013-09-19 DIAGNOSIS — M549 Dorsalgia, unspecified: Secondary | ICD-10-CM

## 2013-09-19 DIAGNOSIS — I889 Nonspecific lymphadenitis, unspecified: Secondary | ICD-10-CM | POA: Insufficient documentation

## 2013-09-19 DIAGNOSIS — M545 Low back pain, unspecified: Secondary | ICD-10-CM | POA: Insufficient documentation

## 2013-09-19 LAB — MICROALBUMIN / CREATININE URINE RATIO
Creatinine, Urine: 71.1 mg/dL
MICROALB UR: 80.83 mg/dL — AB (ref 0.00–1.89)
Microalb Creat Ratio: 1136.8 mg/g — ABNORMAL HIGH (ref 0.0–30.0)

## 2013-09-21 LAB — CYTOLOGY - PAP

## 2013-10-05 ENCOUNTER — Telehealth: Payer: Self-pay | Admitting: Family Medicine

## 2013-10-05 DIAGNOSIS — IMO0001 Reserved for inherently not codable concepts without codable children: Secondary | ICD-10-CM

## 2013-10-05 DIAGNOSIS — E1165 Type 2 diabetes mellitus with hyperglycemia: Principal | ICD-10-CM

## 2013-10-05 NOTE — Telephone Encounter (Signed)
LDL not ordered.  Recently resulted for 6/25 Order for A1c mailed to patient to complete before 8/4 ov with letter explaining.

## 2013-10-06 LAB — HEMOGLOBIN A1C: HEMOGLOBIN-A1C: 10.3

## 2013-10-13 LAB — HEMOGLOBIN A1C
Hgb A1c MFr Bld: 9.5 % — ABNORMAL HIGH (ref <5.7)
Mean Plasma Glucose: 226 mg/dL — ABNORMAL HIGH (ref <117)

## 2013-10-24 ENCOUNTER — Encounter (INDEPENDENT_AMBULATORY_CARE_PROVIDER_SITE_OTHER): Payer: Self-pay

## 2013-10-24 ENCOUNTER — Ambulatory Visit (INDEPENDENT_AMBULATORY_CARE_PROVIDER_SITE_OTHER): Payer: 59 | Admitting: Family Medicine

## 2013-10-24 ENCOUNTER — Encounter: Payer: Self-pay | Admitting: Family Medicine

## 2013-10-24 VITALS — BP 140/82 | HR 82 | Resp 16 | Ht 66.0 in | Wt 196.1 lb

## 2013-10-24 DIAGNOSIS — E785 Hyperlipidemia, unspecified: Secondary | ICD-10-CM

## 2013-10-24 DIAGNOSIS — I10 Essential (primary) hypertension: Secondary | ICD-10-CM

## 2013-10-24 DIAGNOSIS — M549 Dorsalgia, unspecified: Secondary | ICD-10-CM

## 2013-10-24 DIAGNOSIS — E1129 Type 2 diabetes mellitus with other diabetic kidney complication: Secondary | ICD-10-CM

## 2013-10-24 DIAGNOSIS — J302 Other seasonal allergic rhinitis: Secondary | ICD-10-CM

## 2013-10-24 DIAGNOSIS — IMO0002 Reserved for concepts with insufficient information to code with codable children: Secondary | ICD-10-CM

## 2013-10-24 DIAGNOSIS — L0293 Carbuncle, unspecified: Secondary | ICD-10-CM

## 2013-10-24 DIAGNOSIS — L0292 Furuncle, unspecified: Secondary | ICD-10-CM | POA: Insufficient documentation

## 2013-10-24 DIAGNOSIS — J309 Allergic rhinitis, unspecified: Secondary | ICD-10-CM

## 2013-10-24 DIAGNOSIS — E1165 Type 2 diabetes mellitus with hyperglycemia: Secondary | ICD-10-CM

## 2013-10-24 LAB — CBC WITH DIFFERENTIAL/PLATELET
Basophils Absolute: 0 10*3/uL (ref 0.0–0.1)
Basophils Relative: 0 % (ref 0–1)
Eosinophils Absolute: 0.2 10*3/uL (ref 0.0–0.7)
Eosinophils Relative: 3 % (ref 0–5)
HEMATOCRIT: 37.3 % (ref 36.0–46.0)
Hemoglobin: 12 g/dL (ref 12.0–15.0)
LYMPHS PCT: 48 % — AB (ref 12–46)
Lymphs Abs: 3.6 10*3/uL (ref 0.7–4.0)
MCH: 22.1 pg — AB (ref 26.0–34.0)
MCHC: 32.2 g/dL (ref 30.0–36.0)
MCV: 68.7 fL — ABNORMAL LOW (ref 78.0–100.0)
MONO ABS: 0.6 10*3/uL (ref 0.1–1.0)
Monocytes Relative: 8 % (ref 3–12)
Neutro Abs: 3.1 10*3/uL (ref 1.7–7.7)
Neutrophils Relative %: 41 % — ABNORMAL LOW (ref 43–77)
Platelets: 356 10*3/uL (ref 150–400)
RBC: 5.43 MIL/uL — ABNORMAL HIGH (ref 3.87–5.11)
RDW: 17.4 % — ABNORMAL HIGH (ref 11.5–15.5)
WBC: 7.6 10*3/uL (ref 4.0–10.5)

## 2013-10-24 LAB — COMPLETE METABOLIC PANEL WITH GFR
ALT: 29 U/L (ref 0–35)
AST: 23 U/L (ref 0–37)
Albumin: 4.3 g/dL (ref 3.5–5.2)
Alkaline Phosphatase: 84 U/L (ref 39–117)
BILIRUBIN TOTAL: 0.2 mg/dL (ref 0.2–1.2)
BUN: 30 mg/dL — ABNORMAL HIGH (ref 6–23)
CO2: 23 meq/L (ref 19–32)
Calcium: 10.7 mg/dL — ABNORMAL HIGH (ref 8.4–10.5)
Chloride: 103 mEq/L (ref 96–112)
Creat: 1.41 mg/dL — ABNORMAL HIGH (ref 0.50–1.10)
GFR, EST AFRICAN AMERICAN: 48 mL/min — AB
GFR, Est Non African American: 41 mL/min — ABNORMAL LOW
Glucose, Bld: 151 mg/dL — ABNORMAL HIGH (ref 70–99)
Potassium: 5 mEq/L (ref 3.5–5.3)
Sodium: 137 mEq/L (ref 135–145)
TOTAL PROTEIN: 7.9 g/dL (ref 6.0–8.3)

## 2013-10-24 MED ORDER — DOXYCYCLINE HYCLATE 100 MG PO TABS: 100.0000 mg | ORAL_TABLET | Freq: Two times a day (BID) | ORAL | Status: DC

## 2013-10-24 MED ORDER — HYDROCODONE-ACETAMINOPHEN 5-325 MG PO TABS: ORAL_TABLET | ORAL | Status: DC

## 2013-10-24 NOTE — Assessment & Plan Note (Addendum)
3 day h/o left inner thigh boil, antibiotic prescribed

## 2013-10-24 NOTE — Progress Notes (Signed)
   Subjective:    Patient ID: Kathryn Beck, female    DOB: 21-Jan-1956, 58 y.o.   MRN: [id number]  HPI P tin for re evaluation of uncontrolled hypertension and also of back pain recently treated , with no response , actually reports worsened symptoms, chronic buttock pain, left lower extremity weakness and numbness. Pain is limiting ambulation and ability to rest well Increased skin rash with hot climate currently   Review of Systems See HPI Denies recent fever or chills. Denies sinus pressure, nasal congestion, ear pain or sore throat. Denies chest congestion, productive cough or wheezing. Denies chest pains, palpitations and leg swelling Denies abdominal pain, nausea, vomiting,diarrhea or constipation.   Denies dysuria, frequency, hesitancy or incontinence. Denies headaches, seizures, . Denies depression, anxiety or insomnia.         Objective:   Physical Exam BP 140/82  Pulse 82  Resp 16  Ht 5\' 6"  (1.676 m)  Wt 196 lb 1.9 oz (88.959 kg)  BMI 31.67 kg/m2  SpO2 98% Patient alert and oriented and in no cardiopulmonary distress.  HEENT: No facial asymmetry, EOMI,   oropharynx pink and moist.  Neck supple no JVD, no mass.  Chest: Clear to auscultation bilaterally.  CVS: S1, S2 no murmurs, no S3.Regular rate.  ABD: Soft non tender.   Ext: No edema  MS: decreased  ROM thoraco lumbar  spine, adequate in shoulders, hips and knees.  Skin: Intact, boil on left inner, upper thigh, no drainage , redness or warmth from the boil, it is , however , tender  Psych: Good eye contact, normal affect. Memory intact not anxious or depressed appearing.  CNS: CN 2-12 intact, grade 4 power in left lower extremity, grade 5 power in the other 3 extremities. Decreased sensation in left lower extremity, otherwise normal.        Assessment & Plan:  Boil 3 day h/o left inner thigh boil, antibiotic prescribed  Back pain with radiation Disabling back pain, no response to  medication, worsening, associated with left lower extremity weaknwess and numbness, pain also involves right lower extremity at this time, needs MRI, denies incontinence Increase gabapentin to 300mg  daily  HYPERLIPIDEMIA Uncontrolled Hyperlipidemia:Low fat diet discussed and encouraged.  Updated lab needed at/ before next visit.   HYPERTENSION Uncontrolled dose increase in medication DASH diet and commitment to daily physical activity for a minimum of 30 minutes discussed and encouraged, as a part of hypertension management. The importance of attaining a healthy weight is also discussed.   BACK PAIN WITH RADICULOPATHY Worsened, with left lower extremity weakness and numbness, needs MRI No respone to therapy recently tried, will see if gabapentin will help the buttock discomfort she is experiencing  Seasonal allergies Worse in Summer time with excessive heat , pt compliant with treatment

## 2013-10-24 NOTE — Patient Instructions (Addendum)
Nurse visit in 2.5 weeks for BP re check  MD follow up early October  You are referred for MRI of thoracic and lumbar spine  Take gabapentin 144m one in the morning and 2 at bedtime   Doxycycline sent in for boil on inner thigh    CMP and eGFR today  I will be in touch about xray report by the end of this week  Fasting lipid, cmp in October

## 2013-10-25 ENCOUNTER — Encounter: Payer: 59 | Admitting: Family Medicine

## 2013-10-25 ENCOUNTER — Telehealth: Payer: Self-pay | Admitting: Family Medicine

## 2013-10-25 NOTE — Assessment & Plan Note (Signed)
Disabling back pain, no response to medication, worsening, associated with left lower extremity weaknwess and numbness, pain also involves right lower extremity at this time, needs MRI, denies incontinence Increase gabapentin to 300mg  daily

## 2013-10-25 NOTE — Assessment & Plan Note (Signed)
Uncontrolled. Hyperlipidemia:Low fat diet discussed and encouraged.  Updated lab needed at/ before next visit.  

## 2013-10-26 NOTE — Telephone Encounter (Signed)
Noted  

## 2013-10-28 NOTE — Assessment & Plan Note (Addendum)
Worsened, with left lower extremity weakness and numbness, needs MRI No respone to therapy recently tried, will see if gabapentin will help the buttock discomfort she is experiencing

## 2013-10-28 NOTE — Assessment & Plan Note (Signed)
Uncontrolled dose increase in medication DASH diet and commitment to daily physical activity for a minimum of 30 minutes discussed and encouraged, as a part of hypertension management. The importance of attaining a healthy weight is also discussed.

## 2013-10-28 NOTE — Assessment & Plan Note (Signed)
Worse in Summer time with excessive heat , pt compliant with treatment

## 2013-10-31 ENCOUNTER — Ambulatory Visit (HOSPITAL_COMMUNITY)
Admission: RE | Admit: 2013-10-31 | Discharge: 2013-10-31 | Disposition: A | Discharge status: Home / Self Care | Payer: 59 | Source: Ambulatory Visit | Attending: Family Medicine | Admitting: Family Medicine

## 2013-10-31 ENCOUNTER — Encounter (HOSPITAL_COMMUNITY): Payer: Self-pay

## 2013-10-31 DIAGNOSIS — R29898 Other symptoms and signs involving the musculoskeletal system: Secondary | ICD-10-CM | POA: Diagnosis not present

## 2013-10-31 DIAGNOSIS — M51379 Other intervertebral disc degeneration, lumbosacral region without mention of lumbar back pain or lower extremity pain: Secondary | ICD-10-CM | POA: Insufficient documentation

## 2013-10-31 DIAGNOSIS — M549 Dorsalgia, unspecified: Secondary | ICD-10-CM

## 2013-10-31 DIAGNOSIS — M5137 Other intervertebral disc degeneration, lumbosacral region: Secondary | ICD-10-CM | POA: Insufficient documentation

## 2013-10-31 DIAGNOSIS — E119 Type 2 diabetes mellitus without complications: Secondary | ICD-10-CM | POA: Diagnosis not present

## 2013-11-03 ENCOUNTER — Telehealth: Payer: Self-pay

## 2013-11-03 NOTE — Telephone Encounter (Signed)
Patient received injection.  Spoke with md.

## 2013-11-03 NOTE — Telephone Encounter (Signed)
Ok depo medrol 80mg  Im , I also would like to speak with her in a room today while here about her imaging studies, thanks

## 2013-11-15 ENCOUNTER — Ambulatory Visit: Payer: 59

## 2013-11-15 VITALS — BP 134/72

## 2013-11-15 DIAGNOSIS — I10 Essential (primary) hypertension: Secondary | ICD-10-CM

## 2013-11-16 NOTE — Progress Notes (Signed)
Patient in for nurse visit assessment of blood pressure.  Blood pressure taken manually in left arm.  No voiced complaints.  Blood pressure improved with changes in medication.  No further changes

## 2013-12-12 ENCOUNTER — Encounter: Payer: Self-pay | Admitting: *Deleted

## 2013-12-12 LAB — HEMOGLOBIN A1C: A1c: 10.3

## 2013-12-13 ENCOUNTER — Telehealth: Payer: Self-pay

## 2013-12-13 DIAGNOSIS — G4730 Sleep apnea, unspecified: Secondary | ICD-10-CM

## 2013-12-15 ENCOUNTER — Encounter: Payer: 59 | Attending: "Endocrinology | Admitting: Nutrition

## 2013-12-15 DIAGNOSIS — Z713 Dietary counseling and surveillance: Secondary | ICD-10-CM | POA: Insufficient documentation

## 2013-12-15 DIAGNOSIS — E1165 Type 2 diabetes mellitus with hyperglycemia: Principal | ICD-10-CM

## 2013-12-15 DIAGNOSIS — IMO0001 Reserved for inherently not codable concepts without codable children: Secondary | ICD-10-CM | POA: Insufficient documentation

## 2013-12-15 DIAGNOSIS — Z794 Long term (current) use of insulin: Secondary | ICD-10-CM | POA: Diagnosis not present

## 2013-12-15 DIAGNOSIS — E119 Type 2 diabetes mellitus without complications: Secondary | ICD-10-CM

## 2013-12-15 NOTE — Patient Instructions (Signed)
Plan:  Aim for 2-3 Carb Choices per meal (30-45 grams) +/- 1 either way  Aim for 0-1 Carbs per snack if hungry  Include protein in moderation with your meals and snacks Consider  increasing your activity level by 15 for 30 minutes daily as tolerated Consider checking BG at alternate times per day as directed by MD  Consider taking medications of insulin as directed by MD  Goals: 1. Follow the Plate Method. 2. Measure foods out and count carbs. 3. Lose 1 lb per week. 4. Get A1c down to 7% in three months.

## 2013-12-15 NOTE — Progress Notes (Signed)
  Medical Nutrition Therapy:  Appt start time: 1300 end time:  1400.   Assessment:  Primary concerns today: Uncontrolled Diabetes. She notes she has not been taking her insulin as prescribed. She sometimes forgets her insulin and takes it without eating. BS at home have been running in the 200-300's. She does her own cooking and shopping. Most recents A1C 9.7%. Has lost 11 lbs in the last 6 monthss. Taking 40 units of Levemir daily and 10 or more units of Humalog with meals depending on blood sugars. She saw her kidney doctor last week. Not very physically active. Has a job as an [employer]ar[employer] manager. [employer]   Preferred Learning Style:   No preference indicated   Learning Readiness:   Ready  MEDICATIONS: See list   DIETARY INTAKE:  24-hr recall:  B ( AM): egg, 1 slice toast and 2 slices bacon, water  Snk ( AM): none  L ( PM): Glucerna, sometimes skips lunch Snk ( PM): none D ( PM): chicken salad and 8 crackers, water Snk ( PM): varies, cheese, crackrs, chips or misc. Beverages: water mostly  Usual physical activity: ADL's  Estimated energy needs: 1500 calories 170 g carbohydrates 112 g protein 42 g fat  Progress Towards Goal(s):  In progress.   Nutritional Diagnosis:  NB-1.1 Food and nutrition-related knowledge deficit As related to not meeting with a dietitian before with uncotrolled DM.  As evidenced by A1C of 9.7%    Intervention:  Nutrition counseling, meal pattern, SBG, portion sizes and My Plate and benefits of exercise for weight loss.  Plan:  Aim for 2-3 Carb Choices per meal (30-45 grams) +/- 1 either way  Aim for 0-1 Carbs per snack if hungry  Include protein in moderation with your meals and snacks Consider  increasing your activity level by 15 for 30 minutes daily as tolerated Consider checking BG at alternate times per day as directed by MD  Consider taking medications of insulin as directed by MD  Goals: 1. Follow the Plate Method. 2. Measure foods out and count carbs. 3.  Lose 1 lb per week. 4. Get A1c down to 7% in three months.  Teaching Method Utilized:  Visual Auditory Hands on  Handouts given during visit include: Carb Counting Meal Plan Card My Plate  Barriers to learning/adherence to lifestyle change: none  Demonstrated degree of understanding via:  Teach Back   Monitoring/Evaluation:  Dietary intake, exercise, meal planning, My Plate, SBG, medication compliance,, and body weight in 1 month(s).

## 2013-12-18 ENCOUNTER — Other Ambulatory Visit: Payer: Self-pay | Admitting: Family Medicine

## 2013-12-18 NOTE — Telephone Encounter (Signed)
Referral entered  

## 2013-12-19 ENCOUNTER — Other Ambulatory Visit: Payer: Self-pay | Admitting: Family Medicine

## 2013-12-19 DIAGNOSIS — E278 Other specified disorders of adrenal gland: Secondary | ICD-10-CM

## 2013-12-19 DIAGNOSIS — R937 Abnormal findings on diagnostic imaging of other parts of musculoskeletal system: Secondary | ICD-10-CM

## 2013-12-21 ENCOUNTER — Ambulatory Visit: Payer: 59 | Admitting: Family Medicine

## 2013-12-25 LAB — HEMOGLOBIN A1C: A1c: 9.7

## 2014-01-02 ENCOUNTER — Other Ambulatory Visit: Payer: Self-pay | Admitting: Family Medicine

## 2014-01-02 ENCOUNTER — Telehealth: Payer: Self-pay | Admitting: Family Medicine

## 2014-01-02 DIAGNOSIS — Z794 Long term (current) use of insulin: Secondary | ICD-10-CM

## 2014-01-02 DIAGNOSIS — IMO0001 Reserved for inherently not codable concepts without codable children: Secondary | ICD-10-CM

## 2014-01-02 DIAGNOSIS — E786 Lipoprotein deficiency: Secondary | ICD-10-CM

## 2014-01-02 DIAGNOSIS — R0789 Other chest pain: Secondary | ICD-10-CM

## 2014-01-02 DIAGNOSIS — E1165 Type 2 diabetes mellitus with hyperglycemia: Secondary | ICD-10-CM

## 2014-01-02 DIAGNOSIS — T733XXA Exhaustion due to excessive exertion, initial encounter: Secondary | ICD-10-CM

## 2014-01-02 MED ORDER — PRAVASTATIN SODIUM 20 MG PO TABS
20.0000 mg | ORAL_TABLET | Freq: Every day | ORAL | Status: DC
Start: 2014-01-02 — End: 2014-01-17

## 2014-01-02 NOTE — Telephone Encounter (Signed)
Spoke directly with pt, she has marked elevatio i her lipids with high chol/hdl ratio. I have advised reduce /eliminate fried or fatty food, increase to pravachol 20mg  at night  She is being referred for cardioploy eval Pls call explain this to her again, ensure she understands Call on cell  Order non fast hepatic 2nd week in November, needs appt with me the week of Nov 15, pls also get appt info to her

## 2014-01-03 NOTE — Addendum Note (Signed)
Addended by: Denman George B on: 01/03/2014 11:28 AM   Modules accepted: Orders

## 2014-01-09 NOTE — Telephone Encounter (Signed)
Called and left message for patient reviewing message.  Lab order mailed.

## 2014-01-15 ENCOUNTER — Ambulatory Visit: Payer: 59 | Admitting: Nutrition

## 2014-01-15 DIAGNOSIS — Z1211 Encounter for screening for malignant neoplasm of colon: Secondary | ICD-10-CM | POA: Insufficient documentation

## 2014-01-15 DIAGNOSIS — Z0000 Encounter for general adult medical examination without abnormal findings: Secondary | ICD-10-CM | POA: Insufficient documentation

## 2014-01-15 DIAGNOSIS — Z09 Encounter for follow-up examination after completed treatment for conditions other than malignant neoplasm: Secondary | ICD-10-CM | POA: Insufficient documentation

## 2014-01-15 NOTE — Assessment & Plan Note (Signed)
Referred to renal specialist, importance of control of blood pressure and diabetes is stressed also

## 2014-01-15 NOTE — Progress Notes (Signed)
Subjective:    Patient ID: Kathryn Beck, female    DOB: Dec 23, 1955, 58 y.o.   MRN: [id number]  HPI Patient is in for annual physical exam. C/o increased and uncontrolled back pain, new onset, radiating to left foot, no inciting trauma. Review of recent lab shows uncontrolled lipids , worsened renal disease and her blood pressure is uncontrolled . These issues are discussed and addressed also, . Her blood sugar is uncontrolled, she is treated by endocrinology for this  Review of Systems See HPI     Objective:   Physical Exam BP 160/82  Pulse 84  Resp 18  Ht 5\' 6"  (1.676 m)  Wt 201 lb (91.173 kg)  BMI 32.46 kg/m2  SpO2 98%  Pleasant well nourished female, alert and oriented x 3, in no cardio-pulmonary distress. Afebrile. HEENT No facial trauma or asymetry. Sinuses non tender.  Extra occullar muscles intact, pupils equally reactive to light. External ears normal, tympanic membranes clear. Oropharynx moist, no exudate, fairly  good dentition. Neck: supple, no adenopathy,JVD or thyromegaly.No bruits.  Chest: Clear to ascultation bilaterally.No crackles or wheezes. Non tender to palpation  Breast: No asymetry,no masses or lumps. No tenderness. No nipple discharge or inversion. No axillary or supraclavicular adenopathy  Cardiovascular system; Heart sounds normal,  S1 and  S2 ,no S3.  No murmur, or thrill. Apical beat not displaced Peripheral pulses normal.  Abdomen: Soft, non tender, no organomegaly or masses. No bruits. Bowel sounds normal. No guarding, tenderness or rebound.  Rectal:  Normal sphincter tone. No mass.No rectal masses.  Guaiac negative stool.  GU: External genitalia normal female genitalia , female distribution of hair. No lesions. Urethral meatus normal in size, no  Prolapse, no lesions visibly  Present. Bladder non tender. Vagina pink and moist , with no visible lesions , discharge present . Adequate pelvic support no  cystocele or  rectocele noted  Uterus absent, no adnexal masses, no adnexal tenderness.   Musculoskeletal exam: Decreased  ROM of lumbar spine, adewqaute in right hip and right  Knee.Limited in left hip and left knee due to referred pain No deformity ,swelling or crepitus noted. No muscle wasting or atrophy.   Neurologic: Cranial nerves 2 to 12 intact. Power, tone ,sensation and reflexes normal throughout. No disturbance in gait. No tremor.  Skin: Intact, no ulceration or  erythema noted. Hypo Pigmentation in patches  throughout  Psych; Normal mood and affect. Judgement and concentration normal        Assessment & Plan:  Hypertension goal BP (blood pressure) < 130/80 Uncontrolled, add twice daily spironolactone DASH diet and commitment to daily physical activity for a minimum of 30 minutes discussed and encouraged, as a part of hypertension management. The importance of attaining a healthy weight is also discussed.   Encounter for annual physical exam Annual exam as documented. Counseling done  re healthy lifestyle involving commitment to 150 minutes exercise per week, heart healthy diet, and attaining healthy weight.The importance of adequate sleep also discussed. Regular seat belt use and home safety, is also discussed. Changes in health habits are decided on by the patient with goals and time frames  set for achieving them. Immunization and cancer screening needs are specifically addressed at this visit.   Back pain with left-sided radiculopathy uncontrolled pain , keeping pt awake at night. Short course of vicodin and imaging studies Trial of gabapentin for pain management also  CKD (chronic kidney disease) stage 3, GFR 30-59 ml/min Referred to renal specialist, importance of control  of blood pressure and diabetes is stressed also  Hyperlipidemia LDL goal <100 Uncontrolled Hyperlipidemia:Low fat diet discussed and encouraged.    Special screening for malignant neoplasms,  colon No rectal mass Heme negative stool

## 2014-01-15 NOTE — Assessment & Plan Note (Signed)
No rectal mass Heme negative stool

## 2014-01-15 NOTE — Assessment & Plan Note (Signed)
Uncontrolled Hyperlipidemia:Low fat diet discussed and encouraged.

## 2014-01-15 NOTE — Assessment & Plan Note (Addendum)
uncontrolled pain , keeping pt awake at night. Short course of vicodin and imaging studies Trial of gabapentin for pain management also

## 2014-01-15 NOTE — Assessment & Plan Note (Signed)

## 2014-01-17 ENCOUNTER — Ambulatory Visit (INDEPENDENT_AMBULATORY_CARE_PROVIDER_SITE_OTHER): Payer: 59 | Admitting: Cardiology

## 2014-01-17 ENCOUNTER — Encounter (INDEPENDENT_AMBULATORY_CARE_PROVIDER_SITE_OTHER): Payer: Self-pay

## 2014-01-17 ENCOUNTER — Encounter: Payer: Self-pay | Admitting: Cardiology

## 2014-01-17 VITALS — BP 148/78 | HR 83 | Ht 66.0 in | Wt 198.0 lb

## 2014-01-17 DIAGNOSIS — I10 Essential (primary) hypertension: Secondary | ICD-10-CM

## 2014-01-17 DIAGNOSIS — E785 Hyperlipidemia, unspecified: Secondary | ICD-10-CM

## 2014-01-17 DIAGNOSIS — R072 Precordial pain: Secondary | ICD-10-CM

## 2014-01-17 DIAGNOSIS — E782 Mixed hyperlipidemia: Secondary | ICD-10-CM

## 2014-01-17 DIAGNOSIS — R011 Cardiac murmur, unspecified: Secondary | ICD-10-CM

## 2014-01-17 DIAGNOSIS — R0602 Shortness of breath: Secondary | ICD-10-CM

## 2014-01-17 MED ORDER — PRAVASTATIN SODIUM 40 MG PO TABS: 40.0000 mg | ORAL_TABLET | Freq: Every day | ORAL | Status: DC

## 2014-01-17 MED ORDER — EZETIMIBE 10 MG PO TABS
10.0000 mg | ORAL_TABLET | Freq: Every day | ORAL | Status: DC
Start: 2014-01-17 — End: 2014-05-31

## 2014-01-17 NOTE — Patient Instructions (Signed)
Your physician recommends that you schedule a follow-up appointment in: 1 month    Your physician has recommended you make the following change in your medication:     INCREASE Pravachol to 40 mg daily  START Zetia 10 mg daily  Please get FASTING blood work (LIPID,LFT'S) in 8 weeks   Your physician has requested that you have an echocardiogram. Echocardiography is a painless test that uses sound waves to create images of your heart. It provides your doctor with information about the size and shape of your heart and how well your heart's chambers and valves are working. This procedure takes approximately one hour. There are no restrictions for this procedure.     Your physician has requested that you have a lexiscan myoview. For further information please visit [email or web address]. Please follow instruction sheet, as given.    Thank you for choosing [facility] !

## 2014-01-17 NOTE — Assessment & Plan Note (Signed)
Followed by Dr. Moshe Cipro and now Dr. Dorris Fetch. I stressed to her the importance of getting this under control.

## 2014-01-17 NOTE — Progress Notes (Signed)
Reason for visit: Exertional fatigue, chest pain, hyperlipidemia  Clinical Summary Kathryn Beck is a 58 y.o.female referred for cardiology consultation by Dr. Moshe Cipro. She reports a fairly long-standing history of generalized fatigue over the last few years. Intermittently she experiences mild to moderate intensity chest discomfort, leg and arm pain. This is not always with the same level of activity. She does not endorse any specific progression in symptoms. Does report stable dyspnea on exertion at NYHA class II. 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  She is now being followed by Dr. Dorris Fetch for diabetes management, recent adjustments made in insulin. She has a history of hyperlipidemia and previous LFT abnormalities on statin therapy, although reportedly able to tolerate low-dose Pravachol.  Additional medical history is outlined below. She had recent lab work in September showing total cholesterol 314, HDL 46, triglycerides 414, LDL not calculated. Additional studies showed BUN 33, creatinine 1.4, potassium 4.5, normal LFTs, hemoglobin A1c 9.7. It looks like Pravachol was increased to 20 mg daily by Dr. Moshe Cipro.  ECG shows sinus rhythm with poor R-wave progression. She has been told that she has a heart murmur, no ischemic or structural cardiac testing as yet arranged.   Allergies  Allergen Reactions  . Ace Inhibitors Anaphylaxis and Swelling  . Penicillins Itching and Swelling  . Statins Other (See Comments)    elevated LFT's    Current Outpatient Prescriptions  Medication Sig Dispense Refill  . allopurinol (ZYLOPRIM) 300 MG tablet Take 1 tablet (300 mg total) by mouth daily.  30 tablet  3  . Canagliflozin (INVOKANA) 100 MG TABS Take 1 tablet by mouth daily.      . cloNIDine (CATAPRES) 0.1 MG tablet Take 1 tablet by mouth  every evening at 9pm for  blood pressure  90 tablet  0  . doxycycline (VIBRA-TABS) 100 MG tablet Take 1 tablet (100 mg total) by mouth 2 (two) times daily.  14  tablet  0  . fluticasone (FLONASE) 50 MCG/ACT nasal spray Place 2 sprays into both nostrils daily.  16 g  6  . gabapentin (NEURONTIN) 100 MG capsule Take 1 capsule (100 mg total) by mouth 3 (three) times daily.  90 capsule  2  . HYDROcodone-acetaminophen (NORCO/VICODIN) 5-325 MG per tablet One tablet at bedtime, as needed, for uncontrolled back pain Thirty to last 3 months      . insulin detemir (LEVEMIR) 100 UNIT/ML injection Inject 40 Units into the skin at bedtime.       . insulin lispro (HUMALOG) 100 UNIT/ML injection Inject 10 Units into the skin See admin instructions. Twice daily as needed before meals. Patient uses according to sliding scale at home      . loratadine (CLARITIN) 10 MG tablet Take 1 tablet (10 mg total) by mouth daily as needed for allergies.  30 tablet  2  . montelukast (SINGULAIR) 10 MG tablet Take 1 tablet (10 mg total) by mouth at bedtime.  30 tablet  3  . omeprazole (PRILOSEC) 20 MG capsule Take 1 capsule by mouth  daily  90 capsule  0  . pravastatin (PRAVACHOL) 40 MG tablet Take 1 tablet (40 mg total) by mouth daily.  90 tablet  3  . spironolactone (ALDACTONE) 25 MG tablet Take 1 tablet (25 mg total) by mouth 2 (two) times daily.  60 tablet  2  . triamterene-hydrochlorothiazide (MAXZIDE) 75-50 MG per tablet Take 1 tablet by mouth  daily  90 tablet  0  . valsartan (DIOVAN) 320  MG tablet Take 1 tablet by mouth  daily  90 tablet  0  . amLODipine (NORVASC) 10 MG tablet Take 1 tablet (10 mg total) by mouth daily.  90 tablet  1  . ezetimibe (ZETIA) 10 MG tablet Take 1 tablet (10 mg total) by mouth daily.  90 tablet  3  . [DISCONTINUED] FLUoxetine (PROZAC) 10 MG capsule Take 10 mg by mouth daily.        . [DISCONTINUED] glipiZIDE (GLUCOTROL) 10 MG tablet Take 10 mg by mouth 2 (two) times daily before a meal.         Current Facility-Administered Medications  Medication Dose Route Frequency Provider Last Rate Last Dose  . Influenza (>/= 3 years) inactive virus vaccine  (FLVIRIN/FLUZONE) injection SUSP 0.5 mL  0.5 mL Intramuscular Once Fayrene Helper, MD        Past Medical History  Diagnosis Date  . Type 2 diabetes mellitus, uncontrolled   . Essential hypertension   . Back pain   . Anemia   . Obesity   . Axillary masses     Soft tissue - status post excision  . Sleep apnea     Noncompliant with CPAP  . Mixed hyperlipidemia     Past Surgical History  Procedure Laterality Date  . Abdominal hysterectomy    . Lung biopsy    . Foot surgery Bilateral   . Mass excision Right 01/09/2013    Procedure: EXCISION OF NEOPLASM OF RIGHT  AXILLA  AND EXCISION OF NEOPLASM OF LEFT AXILLA;  Surgeon: Jamesetta So, MD;  Location: [facility];  Service: General;  Laterality: Right;  procedure end @ 08:23    History reviewed. No pertinent family history.  Social History [name] reports that she has never smoked. She does not have any smokeless tobacco history on file. [name] reports that she does not drink alcohol.  Review of Systems Complete review of systems negative except as otherwise outlined in the clinical summary and also the following. No claudication.  Physical Examination Filed Vitals:   01/17/14 1014  BP: 148/78  Pulse: 83   Filed Weights   01/17/14 1014  Weight: 198 lb (89.812 kg)   Obese woman, appears comfortable at rest. HEENT: Conjunctiva and lids normal, oropharynx clear. Neck: Supple, no elevated JVP or carotid bruits, no thyromegaly. Lungs: Clear to auscultation, nonlabored breathing at rest. Cardiac: Regular rate and rhythm, no S3, 2/6 systolic murmur at base, no pericardial rub. Abdomen: Soft, nontender, bowel sounds present, no guarding or rebound. Extremities: No pitting edema, distal pulses 2+. Skin: Warm and dry. Musculoskeletal: No kyphosis. Neuropsychiatric: Alert and oriented x3, affect grossly appropriate.   Problem List and Plan   Precordial pain Intermittent, some associated with left arm discomfort,  baseline fatigue and mild dyspnea on exertion. She has significant cardiac risk factors including diabetes mellitus which has not been well controlled, hypertension, and hyperlipidemia. Also sleep apnea, intolerance of CPAP. ECG shows poor R-wave progression. We will obtain a Lexiscan Cardiolite for objective ischemic evaluation and to assess ischemic burden/overall risk.  Cardiac murmur Echocardiogram will be obtained to assess valvular structure, likely sclerotic aortic valve but need to exclude bicuspid.  DM (diabetes mellitus) type II uncontrolled with renal manifestation Followed by Dr. Moshe Cipro and now Dr. Dorris Fetch. I stressed to her the importance of getting this under control.  Mixed hyperlipidemia History reported of statin intolerance related to LFT abnormalities, I do not have the details about which dose or drug at this time  except that she has reportedly tolerated Pravachol. Given her recent baseline lipids, it is unlikely that Pravachol 20 mg will make a significant impact. I have recommended that we increase Pravachol to 40 mg daily and add Zetia, follow-up L LFTs and lipid panel in about 8 weeks. Unless we can make a significant impact on control, will plan to refer her to the lipid clinic.  Essential hypertension No changes made to current regimen. Keep follow-up with Dr. Moshe Cipro.    Satira Sark, M.D., F.A.C.C.

## 2014-01-17 NOTE — Assessment & Plan Note (Signed)
Echocardiogram will be obtained to assess valvular structure, likely sclerotic aortic valve but need to exclude bicuspid.

## 2014-01-17 NOTE — Assessment & Plan Note (Signed)
Intermittent, some associated with left arm discomfort, baseline fatigue and mild dyspnea on exertion. She has significant cardiac risk factors including diabetes mellitus which has not been well controlled, hypertension, and hyperlipidemia. Also sleep apnea, intolerance of CPAP. ECG shows poor R-wave progression. We will obtain a Lexiscan Cardiolite for objective ischemic evaluation and to assess ischemic burden/overall risk.

## 2014-01-17 NOTE — Assessment & Plan Note (Signed)
History reported of statin intolerance related to LFT abnormalities, I do not have the details about which dose or drug at this time except that she has reportedly tolerated Pravachol. Given her recent baseline lipids, it is unlikely that Pravachol 20 mg will make a significant impact. I have recommended that we increase Pravachol to 40 mg daily and add Zetia, follow-up L LFTs and lipid panel in about 8 weeks. Unless we can make a significant impact on control, will plan to refer her to the lipid clinic.

## 2014-01-17 NOTE — Assessment & Plan Note (Signed)
No changes made to current regimen. Keep follow-up with Dr. Moshe Cipro.

## 2014-01-18 ENCOUNTER — Encounter: Payer: Self-pay | Admitting: Cardiology

## 2014-01-24 ENCOUNTER — Ambulatory Visit (HOSPITAL_COMMUNITY)
Admission: RE | Admit: 2014-01-24 | Discharge: 2014-01-24 | Disposition: A | Discharge status: Home / Self Care | Payer: 59 | Source: Ambulatory Visit | Attending: Cardiology | Admitting: Cardiology

## 2014-01-24 ENCOUNTER — Telehealth: Payer: Self-pay | Admitting: *Deleted

## 2014-01-24 ENCOUNTER — Encounter (HOSPITAL_COMMUNITY)
Admission: RE | Admit: 2014-01-24 | Discharge: 2014-01-24 | Disposition: A | Discharge status: Home / Self Care | Payer: 59 | Source: Ambulatory Visit | Attending: Cardiology | Admitting: Cardiology

## 2014-01-24 ENCOUNTER — Encounter (HOSPITAL_COMMUNITY): Payer: Self-pay

## 2014-01-24 DIAGNOSIS — G4730 Sleep apnea, unspecified: Secondary | ICD-10-CM | POA: Insufficient documentation

## 2014-01-24 DIAGNOSIS — R011 Cardiac murmur, unspecified: Secondary | ICD-10-CM

## 2014-01-24 DIAGNOSIS — E669 Obesity, unspecified: Secondary | ICD-10-CM | POA: Insufficient documentation

## 2014-01-24 DIAGNOSIS — R072 Precordial pain: Secondary | ICD-10-CM

## 2014-01-24 DIAGNOSIS — E119 Type 2 diabetes mellitus without complications: Secondary | ICD-10-CM | POA: Diagnosis not present

## 2014-01-24 DIAGNOSIS — E785 Hyperlipidemia, unspecified: Secondary | ICD-10-CM | POA: Insufficient documentation

## 2014-01-24 DIAGNOSIS — I10 Essential (primary) hypertension: Secondary | ICD-10-CM | POA: Diagnosis not present

## 2014-01-24 DIAGNOSIS — R0602 Shortness of breath: Secondary | ICD-10-CM | POA: Insufficient documentation

## 2014-01-24 DIAGNOSIS — I361 Nonrheumatic tricuspid (valve) insufficiency: Secondary | ICD-10-CM | POA: Diagnosis not present

## 2014-01-24 DIAGNOSIS — R079 Chest pain, unspecified: Secondary | ICD-10-CM

## 2014-01-24 DIAGNOSIS — I517 Cardiomegaly: Secondary | ICD-10-CM

## 2014-01-24 DIAGNOSIS — Z6832 Body mass index (BMI) 32.0-32.9, adult: Secondary | ICD-10-CM | POA: Diagnosis not present

## 2014-01-24 MED ORDER — REGADENOSON 0.4 MG/5ML IV SOLN
INTRAVENOUS | Status: AC
  Filled 2014-01-24: qty 5

## 2014-01-24 MED ORDER — TECHNETIUM TC 99M SESTAMIBI - CARDIOLITE
30.0000 | Freq: Once | INTRAVENOUS | Status: AC | PRN
  Administered 2014-01-24: 10:00:00 30 via INTRAVENOUS

## 2014-01-24 MED ORDER — TECHNETIUM TC 99M SESTAMIBI GENERIC - CARDIOLITE
10.0000 | Freq: Once | INTRAVENOUS | Status: AC | PRN
  Administered 2014-01-24: 10 via INTRAVENOUS

## 2014-01-24 MED ORDER — SODIUM CHLORIDE 0.9 % IJ SOLN
10.0000 mL | INTRAMUSCULAR | Status: DC | PRN
  Administered 2014-01-24: 10 mL via INTRAVENOUS
  Filled 2014-01-24: qty 10

## 2014-01-24 MED ORDER — SODIUM CHLORIDE 0.9 % IJ SOLN
INTRAMUSCULAR | Status: AC
  Filled 2014-01-24: qty 10

## 2014-01-24 MED ORDER — REGADENOSON 0.4 MG/5ML IV SOLN
0.4000 mg | Freq: Once | INTRAVENOUS | Status: AC | PRN
  Administered 2014-01-24: 0.4 mg via INTRAVENOUS

## 2014-01-24 NOTE — Telephone Encounter (Signed)
-----   Message from Satira Sark, MD sent at 01/24/2014  1:04 PM EST ----- Reviewed report. Please her know that the stress test looked good overall. No evidence of prior heart attack or blood flow limiting blockages at this time. LVEF is normal at 60%. We will see her back in the office to discuss things further.

## 2014-01-24 NOTE — Telephone Encounter (Signed)
-----   Message from Satira Sark, MD sent at 01/24/2014  1:04 PM EST ----- Reviewed report. LV systolic function is normal. Mild calcification of the aortic and mitral valve, no major valvular abnormalities however. Heart murmur is benign.

## 2014-01-24 NOTE — Progress Notes (Signed)
  Echocardiogram 2D Echocardiogram has been performed.  [place], Hansen 01/24/2014, 12:16 PM

## 2014-01-24 NOTE — Telephone Encounter (Signed)
Pt aware, forwarded to Dr. Moshe Cipro

## 2014-01-24 NOTE — Progress Notes (Signed)
Stress Lab Nurses Notes - [facility]  DANYELA NALLEY 01/24/2014 Reason for doing test: Precordial pain Type of test: Wille Glaser Nurse performing test: Gerrit Halls, RN Nuclear Medicine Tech: Redmond Baseman Echo Tech: Not Applicable MD performing test: Branch/M.Lenze PA Family MD: Moshe Cipro Test explained and consent signed: Yes.   IV started: Saline lock flushed, No redness or edema and Saline lock started in radiology Symptoms: dizziness & flushed Treatment/Intervention: None Reason test stopped: protocol completed After recovery IV was: Discontinued via X-ray tech and No redness or edema Patient to return to Nuc. Med at : 11:45 Patient discharged: Home Patient's Condition upon discharge was: stable Comments: During test BP 111/65 & HR 94.  Recovery BP 126/71 & HR 82.  Symptoms resolved in recovery. Geanie Cooley T

## 2014-02-09 ENCOUNTER — Ambulatory Visit (INDEPENDENT_AMBULATORY_CARE_PROVIDER_SITE_OTHER): Payer: 59 | Admitting: Cardiology

## 2014-02-09 ENCOUNTER — Encounter: Payer: Self-pay | Admitting: Cardiology

## 2014-02-09 VITALS — BP 138/74 | HR 88 | Ht 66.0 in | Wt 201.0 lb

## 2014-02-09 DIAGNOSIS — E782 Mixed hyperlipidemia: Secondary | ICD-10-CM

## 2014-02-09 DIAGNOSIS — R072 Precordial pain: Secondary | ICD-10-CM

## 2014-02-09 NOTE — Assessment & Plan Note (Signed)
She is on Pravachol and Zetia. Follow-up arranged with Dr. Moshe Cipro next month for repeat lab work. Would recommend further up titration as tolerated. If she cannot get her lipid status more optimized, she could be referred to our lipid clinic in [place].

## 2014-02-09 NOTE — Patient Instructions (Signed)
Your physician recommends that you schedule a follow-up appointment in: only as needed    Your physician recommends that you continue on your current medications as directed. Please refer to the Current Medication list given to you today.     Thank you for choosing [facility] !

## 2014-02-09 NOTE — Progress Notes (Signed)
Reason for visit: Follow-up testing  Clinical Summary Ms. Olague is a 58 y.o.female seen recently in late October. She presents to review recent testing. Reports no progressive chest pain or shortness of breath.  Lexiscan Cardiolite from early November showed no evidence of scar or ischemia with LVEF 60%, low risk study. Echocardiogram at that time revealed LVEF 65-70% with grade 1 diastolic dysfunction, mildly sclerotic aortic valve without stenosis, mild left atrial enlargement. We reviewed these results today. Overall reassuring.  Lab work from September showed total cholesterol 314, HDL 46, triglycerides 414, LDL not calculated. Pravachol was increased to 40 g daily and Zetia was added. She states that she has tolerated these medications and will be following up with Dr. Moshe Cipro next month for repeat lab work.  At this point would recommend continued risk factor modification. Certainly if symptoms escalate, we can consider invasive cardiac evaluation.   Allergies  Allergen Reactions  . Ace Inhibitors Anaphylaxis and Swelling  . Penicillins Itching and Swelling  . Statins Other (See Comments)    elevated LFT's    Current Outpatient Prescriptions  Medication Sig Dispense Refill  . allopurinol (ZYLOPRIM) 300 MG tablet Take 1 tablet (300 mg total) by mouth daily. 30 tablet 3  . Canagliflozin (INVOKANA) 100 MG TABS Take 1 tablet by mouth daily.    . cloNIDine (CATAPRES) 0.1 MG tablet Take 1 tablet by mouth  every evening at 9pm for  blood pressure 90 tablet 0  . doxycycline (VIBRA-TABS) 100 MG tablet Take 1 tablet (100 mg total) by mouth 2 (two) times daily. 14 tablet 0  . ezetimibe (ZETIA) 10 MG tablet Take 1 tablet (10 mg total) by mouth daily. 90 tablet 3  . fluticasone (FLONASE) 50 MCG/ACT nasal spray Place 2 sprays into both nostrils daily. 16 g 6  . gabapentin (NEURONTIN) 100 MG capsule Take 1 capsule (100 mg total) by mouth 3 (three) times daily. 90 capsule 2  .  HYDROcodone-acetaminophen (NORCO/VICODIN) 5-325 MG per tablet One tablet at bedtime, as needed, for uncontrolled back pain Thirty to last 3 months    . insulin detemir (LEVEMIR) 100 UNIT/ML injection Inject 40 Units into the skin at bedtime.     . insulin lispro (HUMALOG) 100 UNIT/ML injection Inject 10 Units into the skin See admin instructions. Twice daily as needed before meals. Patient uses according to sliding scale at home    . loratadine (CLARITIN) 10 MG tablet Take 1 tablet (10 mg total) by mouth daily as needed for allergies. 30 tablet 2  . montelukast (SINGULAIR) 10 MG tablet Take 1 tablet (10 mg total) by mouth at bedtime. 30 tablet 3  . omeprazole (PRILOSEC) 20 MG capsule Take 1 capsule by mouth  daily 90 capsule 0  . pravastatin (PRAVACHOL) 40 MG tablet Take 1 tablet (40 mg total) by mouth daily. 90 tablet 3  . spironolactone (ALDACTONE) 25 MG tablet Take 1 tablet (25 mg total) by mouth 2 (two) times daily. 60 tablet 2  . triamterene-hydrochlorothiazide (MAXZIDE) 75-50 MG per tablet Take 1 tablet by mouth  daily 90 tablet 0  . valsartan (DIOVAN) 320 MG tablet Take 1 tablet by mouth  daily 90 tablet 0  . amLODipine (NORVASC) 10 MG tablet Take 1 tablet (10 mg total) by mouth daily. 90 tablet 1  . [DISCONTINUED] FLUoxetine (PROZAC) 10 MG capsule Take 10 mg by mouth daily.      . [DISCONTINUED] glipiZIDE (GLUCOTROL) 10 MG tablet Take 10 mg by mouth 2 (two) times daily  before a meal.       Current Facility-Administered Medications  Medication Dose Route Frequency Provider Last Rate Last Dose  . Influenza (>/= 3 years) inactive virus vaccine (FLVIRIN/FLUZONE) injection SUSP 0.5 mL  0.5 mL Intramuscular Once Fayrene Helper, MD        Past Medical History  Diagnosis Date  . Type 2 diabetes mellitus, uncontrolled   . Essential hypertension   . Back pain   . Anemia   . Obesity   . Axillary masses     Soft tissue - status post excision  . Sleep apnea     Noncompliant with CPAP  .  Mixed hyperlipidemia     Social History Ms. Beaubien reports that she has never smoked. She does not have any smokeless tobacco history on file. Ms. Rajput reports that she does not drink alcohol.  Review of Systems Complete review of systems negative except as otherwise outlined in the clinical summary.  Physical Examination Filed Vitals:   02/09/14 1518  BP: 138/74  Pulse: 88   Filed Weights   02/09/14 1518  Weight: 201 lb (91.173 kg)    Obese woman, appears comfortable at rest. HEENT: Conjunctiva and lids normal, oropharynx clear. Neck: Supple, no elevated JVP or carotid bruits, no thyromegaly. Lungs: Clear to auscultation, nonlabored breathing at rest. Cardiac: Regular rate and rhythm, no S3, 2/6 systolic murmur at base, no pericardial rub. Abdomen: Soft, nontender, bowel sounds present, no guarding or rebound. Extremities: No pitting edema, distal pulses 2+.   Problem List and Plan   Precordial pain Recent cardiac workup overall low risk, no active ischemia by Cardiolite, LVEF normal without major valvular abnormalities. Would recommend risk factor modification strategies. She will keep follow-up Dr. Moshe Cipro, we can certainty see her back if symptoms escalate.  Mixed hyperlipidemia She is on Pravachol and Zetia. Follow-up arranged with Dr. Moshe Cipro next month for repeat lab work. Would recommend further up titration as tolerated. If she cannot get her lipid status more optimized, she could be referred to our lipid clinic in [place].    Satira Sark, M.D., F.A.C.C.

## 2014-02-09 NOTE — Assessment & Plan Note (Signed)
Recent cardiac workup overall low risk, no active ischemia by Cardiolite, LVEF normal without major valvular abnormalities. Would recommend risk factor modification strategies. She will keep follow-up Dr. Moshe Cipro, we can certainty see her back if symptoms escalate.

## 2014-02-13 ENCOUNTER — Other Ambulatory Visit: Payer: Self-pay | Admitting: Family Medicine

## 2014-02-27 ENCOUNTER — Telehealth: Payer: Self-pay | Admitting: *Deleted

## 2014-02-27 MED ORDER — PREDNISONE 5 MG PO TABS: 5.0000 mg | ORAL_TABLET | Freq: Two times a day (BID) | ORAL | Status: DC

## 2014-02-27 NOTE — Telephone Encounter (Signed)
Patient is complaining of chest congestion and cough.  Would like to know if prednisone can be sent in.  Please advise.

## 2014-02-27 NOTE — Telephone Encounter (Signed)
Pt called stating she has a chest cold and she can get phun. Pt states if she can not be seen today can something be called in for her, I made pt aware I will send A message to [facility] we do not have any same day appts.. Please advise

## 2014-02-27 NOTE — Addendum Note (Signed)
Addended by: Denman George B on: 02/27/2014 10:00 AM   Modules accepted: Orders

## 2014-02-27 NOTE — Telephone Encounter (Signed)
Patient aware and med sent to [facility]

## 2014-02-27 NOTE — Telephone Encounter (Signed)
pred 5 mg one twice daily #10, advise needsm to be seenif fever , chills etc

## 2014-03-05 ENCOUNTER — Other Ambulatory Visit: Payer: Self-pay | Admitting: Family Medicine

## 2014-03-06 ENCOUNTER — Other Ambulatory Visit: Payer: Self-pay

## 2014-03-06 MED ORDER — PRAVASTATIN SODIUM 40 MG PO TABS: 40.0000 mg | ORAL_TABLET | Freq: Every day | ORAL | Status: DC

## 2014-03-21 ENCOUNTER — Ambulatory Visit (HOSPITAL_COMMUNITY)
Admission: RE | Admit: 2014-03-21 | Discharge: 2014-03-21 | Disposition: A | Discharge status: Home / Self Care | Payer: 59 | Source: Ambulatory Visit | Attending: Family Medicine | Admitting: Family Medicine

## 2014-03-21 ENCOUNTER — Other Ambulatory Visit: Payer: Self-pay | Admitting: Family Medicine

## 2014-03-21 ENCOUNTER — Encounter (INDEPENDENT_AMBULATORY_CARE_PROVIDER_SITE_OTHER): Payer: Self-pay

## 2014-03-21 ENCOUNTER — Encounter: Payer: Self-pay | Admitting: Family Medicine

## 2014-03-21 ENCOUNTER — Ambulatory Visit (INDEPENDENT_AMBULATORY_CARE_PROVIDER_SITE_OTHER): Payer: 59 | Admitting: Family Medicine

## 2014-03-21 VITALS — BP 158/80 | HR 108 | Temp 98.7°F | Resp 16 | Ht 66.0 in | Wt 202.8 lb

## 2014-03-21 DIAGNOSIS — R0789 Other chest pain: Secondary | ICD-10-CM | POA: Diagnosis not present

## 2014-03-21 DIAGNOSIS — E119 Type 2 diabetes mellitus without complications: Secondary | ICD-10-CM | POA: Insufficient documentation

## 2014-03-21 DIAGNOSIS — I10 Essential (primary) hypertension: Secondary | ICD-10-CM

## 2014-03-21 DIAGNOSIS — J42 Unspecified chronic bronchitis: Secondary | ICD-10-CM

## 2014-03-21 DIAGNOSIS — E1165 Type 2 diabetes mellitus with hyperglycemia: Secondary | ICD-10-CM

## 2014-03-21 DIAGNOSIS — J020 Streptococcal pharyngitis: Secondary | ICD-10-CM

## 2014-03-21 DIAGNOSIS — R6883 Chills (without fever): Secondary | ICD-10-CM | POA: Insufficient documentation

## 2014-03-21 DIAGNOSIS — R0602 Shortness of breath: Secondary | ICD-10-CM | POA: Insufficient documentation

## 2014-03-21 DIAGNOSIS — Z1231 Encounter for screening mammogram for malignant neoplasm of breast: Secondary | ICD-10-CM

## 2014-03-21 DIAGNOSIS — E1065 Type 1 diabetes mellitus with hyperglycemia: Secondary | ICD-10-CM

## 2014-03-21 DIAGNOSIS — Z794 Long term (current) use of insulin: Secondary | ICD-10-CM

## 2014-03-21 DIAGNOSIS — R05 Cough: Secondary | ICD-10-CM | POA: Diagnosis present

## 2014-03-21 DIAGNOSIS — E782 Mixed hyperlipidemia: Secondary | ICD-10-CM

## 2014-03-21 DIAGNOSIS — IMO0001 Reserved for inherently not codable concepts without codable children: Secondary | ICD-10-CM

## 2014-03-21 LAB — POCT RAPID STREP A (OFFICE): Rapid Strep A Screen: POSITIVE — AB

## 2014-03-21 MED ORDER — CLARITHROMYCIN 500 MG PO TABS: 500.0000 mg | ORAL_TABLET | Freq: Two times a day (BID) | ORAL | Status: DC

## 2014-03-21 MED ORDER — BENZONATATE 100 MG PO CAPS: 100.0000 mg | ORAL_CAPSULE | Freq: Two times a day (BID) | ORAL | Status: DC | PRN

## 2014-03-21 NOTE — Progress Notes (Signed)
   Subjective:    Patient ID: Kathryn Beck, female    DOB: [birth year], 58 y.o.   MRN: [id number]  HPI Acute onset of sore throat  And bilateral ear pain, no known sick contact. Intermittent chills and fever for 4 weeks also cough  Cough productive of yellow sputum and also nasal congestion with clear drainage   Review of Systems See HPI  Denies chest pains, palpitations and leg swelling Denies abdominal pain, nausea, vomiting,diarrhea or constipation.   Denies dysuria, frequency, hesitancy or incontinence. Denies joint pain, swelling and limitation in mobility. Denies headaches, seizures, numbness, or tingling. Denies depression, anxiety or insomnia. Denies skin break down or rash.        Objective:   Physical Exam BP 158/80 mmHg  Pulse 108  Temp(Src) 98.7 F (37.1 C) (Oral)  Resp 16  Ht 5\' 6"  (1.676 m)  Wt 202 lb 12.8 oz (91.989 kg)  BMI 32.75 kg/m2  SpO2 96% Patient alert and oriented and in no cardiopulmonary distress.Ill appearing  HEENT: No facial asymmetry, EOMI,   oropharynx erythematous and moist.  Neck supple no JVD, left anterior tender cervical adenitis, TM clear bilaterally, nasal mucosa eryhtematous .  Chest: decreased though adequate air entry, bi basilar crackles, no wheezes  CVS: S1, S2 no murmurs, no S3.Regular rate.  ABD: Soft non tender.   Ext: No edema  MS: Adequate ROM spine, shoulders, hips and knees.  Skin: Intact, no ulcerations or rash noted.  Psych: Good eye contact, normal affect. Memory intact not anxious or depressed appearing.  CNS: CN 2-12 intact, power,  normal throughout.no focal deficits noted.       Assessment & Plan:  Chronic bronchitis Antibiotic and decongestant prescribed, also CXR obtained   Essential hypertension Uncontrolled today , states she has been  Taking an excessive amt of decongestant , will review at next visit DASH diet and commitment to daily physical activity for a minimum of 30 minutes discussed  and encouraged, as a part of hypertension management. The importance of attaining a healthy weight is also discussed.   Diabetes mellitus, insulin dependent (IDDM), uncontrolled Treated by endo, still uncontrolled, but reportedly slightly  improved Patient advised to reduce carb and sweets, commit to regular physical activity, take meds as prescribed, test blood as directed, and attempt to lose weight, to improve blood sugar control.   Mixed hyperlipidemia Hyperlipidemia:Low fat diet discussed and encouraged.  Updated lab needed at/ before next visit.   Strep pharyngitis Documented  Strep infection, clarithromycin prescribed, 24 hour isolation recommended

## 2014-03-21 NOTE — Patient Instructions (Signed)
F/u as before  You are  Treated for strep throat and chronic  Bronchitis, 2 medications are sent to your pharmacy  CXR today   Pls schedule your mammogram since past due  Hope you feel better soon , and all the best for 2016!

## 2014-03-24 DIAGNOSIS — J020 Streptococcal pharyngitis: Secondary | ICD-10-CM | POA: Insufficient documentation

## 2014-03-24 NOTE — Assessment & Plan Note (Signed)
Hyperlipidemia:Low fat diet discussed and encouraged.  Updated lab needed at/ before next visit.  

## 2014-03-24 NOTE — Assessment & Plan Note (Signed)
Uncontrolled today , states she has been  Taking an excessive amt of decongestant , will review at next visit DASH diet and commitment to daily physical activity for a minimum of 30 minutes discussed and encouraged, as a part of hypertension management. The importance of attaining a healthy weight is also discussed.

## 2014-03-24 NOTE — Assessment & Plan Note (Signed)
Treated by endo, still uncontrolled, but reportedly slightly  improved Patient advised to reduce carb and sweets, commit to regular physical activity, take meds as prescribed, test blood as directed, and attempt to lose weight, to improve blood sugar control.

## 2014-03-24 NOTE — Assessment & Plan Note (Signed)
Documented  Strep infection, clarithromycin prescribed, 24 hour isolation recommended

## 2014-03-24 NOTE — Assessment & Plan Note (Signed)
Antibiotic and decongestant prescribed, also CXR obtained

## 2014-04-02 ENCOUNTER — Ambulatory Visit (HOSPITAL_COMMUNITY)
Admission: RE | Admit: 2014-04-02 | Discharge: 2014-04-02 | Disposition: A | Discharge status: Home / Self Care | Payer: 59 | Source: Ambulatory Visit | Attending: Family Medicine | Admitting: Family Medicine

## 2014-04-02 ENCOUNTER — Ambulatory Visit (INDEPENDENT_AMBULATORY_CARE_PROVIDER_SITE_OTHER): Payer: 59 | Admitting: Family Medicine

## 2014-04-02 ENCOUNTER — Encounter: Payer: Self-pay | Admitting: Family Medicine

## 2014-04-02 VITALS — BP 120/72 | HR 94 | Temp 98.6°F | Resp 16 | Ht 66.0 in | Wt 200.0 lb

## 2014-04-02 DIAGNOSIS — Z1231 Encounter for screening mammogram for malignant neoplasm of breast: Secondary | ICD-10-CM | POA: Insufficient documentation

## 2014-04-02 DIAGNOSIS — E1165 Type 2 diabetes mellitus with hyperglycemia: Secondary | ICD-10-CM

## 2014-04-02 DIAGNOSIS — J020 Streptococcal pharyngitis: Secondary | ICD-10-CM

## 2014-04-02 DIAGNOSIS — E790 Hyperuricemia without signs of inflammatory arthritis and tophaceous disease: Secondary | ICD-10-CM

## 2014-04-02 DIAGNOSIS — E782 Mixed hyperlipidemia: Secondary | ICD-10-CM

## 2014-04-02 DIAGNOSIS — Z794 Long term (current) use of insulin: Secondary | ICD-10-CM

## 2014-04-02 DIAGNOSIS — J329 Chronic sinusitis, unspecified: Secondary | ICD-10-CM | POA: Insufficient documentation

## 2014-04-02 DIAGNOSIS — J029 Acute pharyngitis, unspecified: Secondary | ICD-10-CM

## 2014-04-02 DIAGNOSIS — J040 Acute laryngitis: Secondary | ICD-10-CM | POA: Insufficient documentation

## 2014-04-02 DIAGNOSIS — E1065 Type 1 diabetes mellitus with hyperglycemia: Secondary | ICD-10-CM

## 2014-04-02 DIAGNOSIS — IMO0001 Reserved for inherently not codable concepts without codable children: Secondary | ICD-10-CM

## 2014-04-02 DIAGNOSIS — K219 Gastro-esophageal reflux disease without esophagitis: Secondary | ICD-10-CM

## 2014-04-02 DIAGNOSIS — I10 Essential (primary) hypertension: Secondary | ICD-10-CM

## 2014-04-02 DIAGNOSIS — E559 Vitamin D deficiency, unspecified: Secondary | ICD-10-CM

## 2014-04-02 LAB — POCT RAPID STREP A (OFFICE): RAPID STREP A SCREEN: NEGATIVE

## 2014-04-02 MED ORDER — CLINDAMYCIN HCL 150 MG PO CAPS: 150.0000 mg | ORAL_CAPSULE | Freq: Three times a day (TID) | ORAL | Status: DC

## 2014-04-02 NOTE — Progress Notes (Signed)
   Subjective:    Patient ID: Kathryn Beck, female    DOB: November 27, 1955, 59 y.o.   MRN: [id number]  HPI Pt in with c/o continiued head fullness and pressure, sore throat , ongoing green post nasal drainage and sore throat for ovrer 3 weeks , despite course of treatment for strep throat, states her chest congestion is better but still feels  Ill, still has intermittent chills , no documented fever Denies polyuria, polydipsia, blurred vision , or hypoglycemic episodes.    Review of Systems See HPI  Denies chest pains, palpitations and leg swelling Denies abdominal pain, nausea, vomiting,diarrhea or constipation.   Denies dysuria, frequency, hesitancy or incontinence. Denies joint pain, swelling and limitation in mobility. Denies headaches, seizures, numbness, or tingling. Denies depression, anxiety or insomnia. Denies skin break down or rash.        Objective:   Physical Exam BP 120/72 mmHg  Pulse 94  Temp(Src) 98.6 F (37 C) (Oral)  Resp 16  Ht 5\' 6"  (1.676 m)  Wt 200 lb (90.719 kg)  BMI 32.30 kg/m2  SpO2 97% Patient alert and oriented and in no cardiopulmonary distress.Hoares  HEENT: No facial asymmetry, EOMI,   oropharynx pink and moist.  Neck supple no JVD, bilateral tender cervical adenopathy, generalized sinus tenderness, TM clear bilaterally, edema and erythema of nasal mucosa  Chest: Clear to auscultation bilaterally.  CVS: S1, S2 no murmurs, no S3.Regular rate.  ABD: Soft non tender.   Ext: No edema  MS: Adequate ROM spine, shoulders, hips and knees.  Skin: Intact, no ulcerations or rash noted.  Psych: Good eye contact, normal affect. Memory intact not anxious or depressed appearing.  CNS: CN 2-12 intact, power,  normal throughout.no focal deficits noted.        Assessment & Plan:  Sinusitis, chronic Generalized facial pressure with bilateral ear pain and ongoing purulent post nasal drainage per history, despite recent 2 week antibiotic course,  also laryngitis and sore throat, treat with cleocin, obtain sinus xray, refer to ENT, drainage to be tested by c/s as soon as possible   Strep pharyngitis Due to c/o sore throat , swab re tested for strep, test is negative   Laryngitis Loss of voice associated with ongoing infection also GERd, ENT to evaluate   Essential hypertension Controlled, no change in medication DASH diet and commitment to daily physical activity for a minimum of 30 minutes discussed and encouraged, as a part of hypertension management. The importance of attaining a healthy weight is also discussed.    GERD (gastroesophageal reflux disease) Possibly uncontrolled and contributing to c/o laryngitis, ENT to eval   Diabetes mellitus, insulin dependent (IDDM), uncontrolled Uncontrolled, treated by endo Patient advised to reduce carb and sweets, commit to regular physical activity, take meds as prescribed, test blood as directed, and attempt to lose weight, to improve blood sugar control. Will follow along

## 2014-04-02 NOTE — Patient Instructions (Addendum)
F/u last week in January or first week in Feb cancel Jan 21  Antibiotic is prescribed  You will be referred for a scan of your sinuses  Pls submit sputum for culture as soon as possible  You are referred toENT  Blood pressure is excellent,   Fasting lipid, cmp and EGFr, CBc, uric acid and vit D for next visit

## 2014-04-03 ENCOUNTER — Other Ambulatory Visit: Payer: Self-pay

## 2014-04-03 DIAGNOSIS — J329 Chronic sinusitis, unspecified: Secondary | ICD-10-CM

## 2014-04-04 ENCOUNTER — Ambulatory Visit (HOSPITAL_COMMUNITY)
Admission: RE | Admit: 2014-04-04 | Discharge: 2014-04-04 | Disposition: A | Discharge status: Home / Self Care | Payer: 59 | Source: Ambulatory Visit | Attending: Family Medicine | Admitting: Family Medicine

## 2014-04-04 ENCOUNTER — Telehealth: Payer: Self-pay | Admitting: Family Medicine

## 2014-04-04 DIAGNOSIS — J329 Chronic sinusitis, unspecified: Secondary | ICD-10-CM | POA: Insufficient documentation

## 2014-04-04 NOTE — Assessment & Plan Note (Signed)
Controlled, no change in medication DASH diet and commitment to daily physical activity for a minimum of 30 minutes discussed and encouraged, as a part of hypertension management. The importance of attaining a healthy weight is also discussed.  

## 2014-04-04 NOTE — Assessment & Plan Note (Signed)
Uncontrolled, treated by endo Patient advised to reduce carb and sweets, commit to regular physical activity, take meds as prescribed, test blood as directed, and attempt to lose weight, to improve blood sugar control. Will follow along

## 2014-04-04 NOTE — Assessment & Plan Note (Signed)
Loss of voice associated with ongoing infection also GERd, ENT to evaluate

## 2014-04-04 NOTE — Assessment & Plan Note (Signed)
Possibly uncontrolled and contributing to c/o laryngitis, ENT to eval

## 2014-04-04 NOTE — Telephone Encounter (Signed)
pls cancel request for ct sinus, or disregard if unable to cancel. I have entered an xray of sinuses instead, please let pt know she is to get an xray of her sinusres , not a scan, and that she can go to xray dept at any time soon, as the order is already placed

## 2014-04-04 NOTE — Assessment & Plan Note (Signed)
Generalized facial pressure with bilateral ear pain and ongoing purulent post nasal drainage per history, despite recent 2 week antibiotic course, also laryngitis and sore throat, treat with cleocin, obtain sinus xray, refer to ENT, drainage to be tested by c/s as soon as possible

## 2014-04-04 NOTE — Assessment & Plan Note (Signed)
Due to c/o sore throat , swab re tested for strep, test is negative

## 2014-04-06 LAB — RESPIRATORY CULTURE OR RESPIRATORY AND SPUTUM CULTURE
GRAM STAIN: NONE SEEN
Organism ID, Bacteria: NORMAL

## 2014-04-08 ENCOUNTER — Other Ambulatory Visit: Payer: Self-pay | Admitting: Family Medicine

## 2014-04-12 ENCOUNTER — Ambulatory Visit: Payer: 59 | Admitting: Family Medicine

## 2014-04-19 ENCOUNTER — Ambulatory Visit (INDEPENDENT_AMBULATORY_CARE_PROVIDER_SITE_OTHER): Payer: 59 | Admitting: Otolaryngology

## 2014-04-19 DIAGNOSIS — K219 Gastro-esophageal reflux disease without esophagitis: Secondary | ICD-10-CM

## 2014-04-19 DIAGNOSIS — R490 Dysphonia: Secondary | ICD-10-CM

## 2014-04-19 DIAGNOSIS — H9011 Conductive hearing loss, unilateral, right ear, with unrestricted hearing on the contralateral side: Secondary | ICD-10-CM

## 2014-04-19 DIAGNOSIS — H6521 Chronic serous otitis media, right ear: Secondary | ICD-10-CM

## 2014-04-26 ENCOUNTER — Ambulatory Visit (INDEPENDENT_AMBULATORY_CARE_PROVIDER_SITE_OTHER): Payer: 59 | Admitting: Otolaryngology

## 2014-04-27 LAB — HM DIABETES EYE EXAM

## 2014-05-04 ENCOUNTER — Other Ambulatory Visit: Payer: Self-pay | Admitting: Family Medicine

## 2014-05-04 LAB — COMPLETE METABOLIC PANEL WITH GFR
ALBUMIN: 3.8 g/dL (ref 3.5–5.2)
ALK PHOS: 71 U/L (ref 39–117)
ALT: 20 U/L (ref 0–35)
AST: 19 U/L (ref 0–37)
BILIRUBIN TOTAL: 0.3 mg/dL (ref 0.2–1.2)
BUN: 23 mg/dL (ref 6–23)
CHLORIDE: 103 meq/L (ref 96–112)
CO2: 27 mEq/L (ref 19–32)
Calcium: 9.6 mg/dL (ref 8.4–10.5)
Creat: 1.39 mg/dL — ABNORMAL HIGH (ref 0.50–1.10)
GFR, Est African American: 48 mL/min — ABNORMAL LOW
GFR, Est Non African American: 42 mL/min — ABNORMAL LOW
Glucose, Bld: 194 mg/dL — ABNORMAL HIGH (ref 70–99)
Potassium: 4.2 mEq/L (ref 3.5–5.3)
Sodium: 140 mEq/L (ref 135–145)
TOTAL PROTEIN: 7.1 g/dL (ref 6.0–8.3)

## 2014-05-04 LAB — URIC ACID: Uric Acid, Serum: 10.3 mg/dL — ABNORMAL HIGH (ref 2.4–7.0)

## 2014-05-04 LAB — CBC
HCT: 35.6 % — ABNORMAL LOW (ref 36.0–46.0)
Hemoglobin: 10.9 g/dL — ABNORMAL LOW (ref 12.0–15.0)
MCH: 22 pg — ABNORMAL LOW (ref 26.0–34.0)
MCHC: 30.6 g/dL (ref 30.0–36.0)
MCV: 71.9 fL — AB (ref 78.0–100.0)
MPV: 10.7 fL (ref 8.6–12.4)
PLATELETS: 306 10*3/uL (ref 150–400)
RBC: 4.95 MIL/uL (ref 3.87–5.11)
RDW: 17.8 % — AB (ref 11.5–15.5)
WBC: 5.3 10*3/uL (ref 4.0–10.5)

## 2014-05-04 LAB — LIPID PANEL
CHOLESTEROL: 187 mg/dL (ref 0–200)
HDL: 38 mg/dL — AB (ref >39)
LDL Cholesterol: 89 mg/dL (ref 0–99)
TRIGLYCERIDES: 298 mg/dL — AB (ref <150)
Total CHOL/HDL Ratio: 4.9 Ratio
VLDL: 60 mg/dL — AB (ref 0–40)

## 2014-05-04 LAB — VITAMIN D 25 HYDROXY (VIT D DEFICIENCY, FRACTURES): Vit D, 25-Hydroxy: 25 ng/mL — ABNORMAL LOW (ref 30–100)

## 2014-05-07 ENCOUNTER — Ambulatory Visit: Payer: 59 | Admitting: Family Medicine

## 2014-05-07 LAB — IRON AND TIBC
%SAT: 16 % — AB (ref 20–55)
IRON: 52 ug/dL (ref 42–145)
TIBC: 320 ug/dL (ref 250–470)
UIBC: 268 ug/dL (ref 125–400)

## 2014-05-07 LAB — VITAMIN B12: Vitamin B-12: 528 pg/mL (ref 211–911)

## 2014-05-07 LAB — FOLATE: FOLATE: 12.1 ng/mL

## 2014-05-07 LAB — FERRITIN: Ferritin: 336 ng/mL — ABNORMAL HIGH (ref 10–291)

## 2014-05-17 ENCOUNTER — Ambulatory Visit (INDEPENDENT_AMBULATORY_CARE_PROVIDER_SITE_OTHER): Payer: 59 | Admitting: Otolaryngology

## 2014-05-17 DIAGNOSIS — R490 Dysphonia: Secondary | ICD-10-CM

## 2014-05-17 DIAGNOSIS — K219 Gastro-esophageal reflux disease without esophagitis: Secondary | ICD-10-CM

## 2014-05-17 DIAGNOSIS — H9011 Conductive hearing loss, unilateral, right ear, with unrestricted hearing on the contralateral side: Secondary | ICD-10-CM

## 2014-05-17 DIAGNOSIS — H6981 Other specified disorders of Eustachian tube, right ear: Secondary | ICD-10-CM

## 2014-05-17 DIAGNOSIS — H6521 Chronic serous otitis media, right ear: Secondary | ICD-10-CM

## 2014-05-31 ENCOUNTER — Other Ambulatory Visit: Payer: Self-pay

## 2014-05-31 DIAGNOSIS — J302 Other seasonal allergic rhinitis: Secondary | ICD-10-CM

## 2014-05-31 MED ORDER — EZETIMIBE 10 MG PO TABS: 10.0000 mg | ORAL_TABLET | Freq: Every day | ORAL | Status: DC

## 2014-05-31 MED ORDER — MONTELUKAST SODIUM 10 MG PO TABS: 10.0000 mg | ORAL_TABLET | Freq: Every day | ORAL | Status: DC

## 2014-06-14 ENCOUNTER — Ambulatory Visit (INDEPENDENT_AMBULATORY_CARE_PROVIDER_SITE_OTHER): Payer: 59 | Admitting: Otolaryngology

## 2014-06-18 ENCOUNTER — Ambulatory Visit: Payer: 59 | Admitting: Family Medicine

## 2014-06-22 ENCOUNTER — Other Ambulatory Visit: Payer: Self-pay

## 2014-06-22 MED ORDER — SPIRONOLACTONE 25 MG PO TABS: ORAL_TABLET | ORAL | Status: DC

## 2014-07-17 ENCOUNTER — Telehealth: Payer: Self-pay | Admitting: Family Medicine

## 2014-07-17 DIAGNOSIS — E782 Mixed hyperlipidemia: Secondary | ICD-10-CM

## 2014-07-18 NOTE — Telephone Encounter (Signed)
Patient recently had labs in Feb does she need any further before July appointment

## 2014-07-19 NOTE — Telephone Encounter (Signed)
Appointment 7.5.16 at 3:30

## 2014-07-20 NOTE — Telephone Encounter (Signed)
pls advise her Tg are too high. Ask iof taking zetia prescribed, if not and able to afford, pls re send the zetia sent in December, also NEEDS to work [employer] makes a big difference If unable to afford (there are coupons to try ) work just as hard on diet and continue pravachol  Recommend rept fasting lipid and hepatic for July

## 2014-07-23 NOTE — Telephone Encounter (Signed)
Lab order mailed to patient.

## 2014-07-23 NOTE — Telephone Encounter (Signed)
Called and left message for patient to return call.  

## 2014-07-23 NOTE — Addendum Note (Signed)
Addended by: Denman George B on: 07/23/2014 11:24 AM   Modules accepted: Orders

## 2014-09-25 ENCOUNTER — Encounter: Payer: Self-pay | Admitting: *Deleted

## 2014-09-25 ENCOUNTER — Encounter: Payer: 59 | Admitting: Family Medicine

## 2014-09-26 ENCOUNTER — Other Ambulatory Visit: Payer: Self-pay | Admitting: Family Medicine

## 2014-09-26 LAB — HEPATIC FUNCTION PANEL
ALK PHOS: 87 U/L (ref 39–117)
ALT: 20 U/L (ref 0–35)
AST: 16 U/L (ref 0–37)
Albumin: 3.8 g/dL (ref 3.5–5.2)
Bilirubin, Direct: <0.1 mg/dL (ref 0.0–0.3)
TOTAL PROTEIN: 7 g/dL (ref 6.0–8.3)
Total Bilirubin: 0.2 mg/dL (ref 0.2–1.2)

## 2014-09-26 LAB — LIPID PANEL
Cholesterol: 181 mg/dL (ref 0–200)
HDL: 32 mg/dL — ABNORMAL LOW (ref >=46)
Total CHOL/HDL Ratio: 5.7 Ratio
Triglycerides: 607 mg/dL — ABNORMAL HIGH (ref <150)

## 2014-09-27 LAB — LDL CHOLESTEROL, DIRECT: LDL DIRECT: 90 mg/dL

## 2014-10-02 ENCOUNTER — Ambulatory Visit (INDEPENDENT_AMBULATORY_CARE_PROVIDER_SITE_OTHER): Payer: 59 | Admitting: Family Medicine

## 2014-10-02 ENCOUNTER — Encounter: Payer: Self-pay | Admitting: Family Medicine

## 2014-10-02 VITALS — BP 134/78 | HR 98 | Resp 16 | Ht 66.0 in | Wt 196.0 lb

## 2014-10-02 DIAGNOSIS — IMO0001 Reserved for inherently not codable concepts without codable children: Secondary | ICD-10-CM

## 2014-10-02 DIAGNOSIS — E1165 Type 2 diabetes mellitus with hyperglycemia: Secondary | ICD-10-CM

## 2014-10-02 DIAGNOSIS — E1065 Type 1 diabetes mellitus with hyperglycemia: Secondary | ICD-10-CM

## 2014-10-02 DIAGNOSIS — I10 Essential (primary) hypertension: Secondary | ICD-10-CM

## 2014-10-02 DIAGNOSIS — D649 Anemia, unspecified: Secondary | ICD-10-CM

## 2014-10-02 DIAGNOSIS — Z23 Encounter for immunization: Secondary | ICD-10-CM

## 2014-10-02 DIAGNOSIS — E782 Mixed hyperlipidemia: Secondary | ICD-10-CM | POA: Diagnosis not present

## 2014-10-02 DIAGNOSIS — E669 Obesity, unspecified: Secondary | ICD-10-CM

## 2014-10-02 DIAGNOSIS — R6889 Other general symptoms and signs: Secondary | ICD-10-CM

## 2014-10-02 DIAGNOSIS — Z794 Long term (current) use of insulin: Secondary | ICD-10-CM

## 2014-10-02 LAB — COMPLETE METABOLIC PANEL WITH GFR
ALT: 28 U/L (ref 0–35)
AST: 26 U/L (ref 0–37)
Albumin: 4.2 g/dL (ref 3.5–5.2)
Alkaline Phosphatase: 95 U/L (ref 39–117)
BUN: 30 mg/dL — AB (ref 6–23)
CO2: 24 mEq/L (ref 19–32)
Calcium: 10.4 mg/dL (ref 8.4–10.5)
Chloride: 102 mEq/L (ref 96–112)
Creat: 1.6 mg/dL — ABNORMAL HIGH (ref 0.50–1.10)
GFR, EST NON AFRICAN AMERICAN: 35 mL/min — AB
GFR, Est African American: 41 mL/min — ABNORMAL LOW
GLUCOSE: 172 mg/dL — AB (ref 70–99)
Potassium: 5.1 mEq/L (ref 3.5–5.3)
Sodium: 142 mEq/L (ref 135–145)
TOTAL PROTEIN: 7.6 g/dL (ref 6.0–8.3)
Total Bilirubin: 0.3 mg/dL (ref 0.2–1.2)

## 2014-10-02 LAB — CBC
HCT: 37 % (ref 36.0–46.0)
HEMOGLOBIN: 11.7 g/dL — AB (ref 12.0–15.0)
MCH: 21.9 pg — ABNORMAL LOW (ref 26.0–34.0)
MCHC: 31.6 g/dL (ref 30.0–36.0)
MCV: 69.2 fL — ABNORMAL LOW (ref 78.0–100.0)
MPV: 10.4 fL (ref 8.6–12.4)
Platelets: 294 10*3/uL (ref 150–400)
RBC: 5.35 MIL/uL — ABNORMAL HIGH (ref 3.87–5.11)
RDW: 16.6 % — ABNORMAL HIGH (ref 11.5–15.5)
WBC: 5.5 10*3/uL (ref 4.0–10.5)

## 2014-10-02 LAB — FERRITIN: Ferritin: 397 ng/mL — ABNORMAL HIGH (ref 10–291)

## 2014-10-02 LAB — TSH: TSH: 2.848 u[IU]/mL (ref 0.350–4.500)

## 2014-10-02 LAB — IRON: IRON: 71 ug/dL (ref 42–145)

## 2014-10-02 MED ORDER — EZETIMIBE 10 MG PO TABS: 10.0000 mg | ORAL_TABLET | Freq: Every day | ORAL | Status: DC

## 2014-10-02 MED ORDER — ERGOCALCIFEROL 1.25 MG (50000 UT) PO CAPS: 50000.0000 [IU] | ORAL_CAPSULE | ORAL | Status: DC

## 2014-10-02 NOTE — Patient Instructions (Addendum)
F/u in 4 month, call if you need me before  TdAP today   HBA1C, chem 7 and EGFR and TSH, CBC , IRON AND FERRITIN  Microalb todaY   sTART LEVEMIr 70 UNITS AS OF TODAY  It is important that you exercise regularly at least 30 minutes 5 times a week. If you develop chest pain, have severe difficulty breathing, or feel very tired, stop exercising immediately and seek medical attention    pLEASE TRIGLYCERIDES ARE TOO HIGH, STOP EGG YOLKS, RED MEAT, FRIED AND FATTY FOODS AND BUTTER AND MARGERINE   NEW ADDITIONAL MED FOR TG IS ZETIA , CONTINUE PRAVACHOL TAKE VITAMIN D REGULARLY

## 2014-10-03 LAB — MICROALBUMIN / CREATININE URINE RATIO
Creatinine, Urine: 95.7 mg/dL
MICROALB UR: 92.2 mg/dL — AB (ref <2.0)
Microalb Creat Ratio: 963.4 mg/g — ABNORMAL HIGH (ref 0.0–30.0)

## 2014-10-03 LAB — HEMOGLOBIN A1C
HEMOGLOBIN A1C: 9.7 % — AB (ref <5.7)
Mean Plasma Glucose: 232 mg/dL — ABNORMAL HIGH (ref <117)

## 2014-10-07 DIAGNOSIS — Z23 Encounter for immunization: Secondary | ICD-10-CM | POA: Insufficient documentation

## 2014-10-07 DIAGNOSIS — R6889 Other general symptoms and signs: Secondary | ICD-10-CM | POA: Insufficient documentation

## 2014-10-07 NOTE — Assessment & Plan Note (Signed)
Current flare of erythematous rash on face and exposed areas of her arms, will send hydroxyzine for itch

## 2014-10-07 NOTE — Assessment & Plan Note (Signed)
Markedly elevated TG, needs to reduce fried and fatty foods, will try  to add zetia also Hyperlipidemia:Low fat diet discussed and encouraged.   Lipid Panel  Lab Results  Component Value Date   CHOL 181 09/26/2014   HDL 32* 09/26/2014   LDLCALC NOT CALC 09/26/2014   LDLDIRECT 90 09/26/2014   TRIG 607* 09/26/2014   CHOLHDL 5.7 09/26/2014

## 2014-10-07 NOTE — Assessment & Plan Note (Signed)
Controlled, no change in medication DASH diet and commitment to daily physical activity for a minimum of 30 minutes discussed and encouraged, as a part of hypertension management. The importance of attaining a healthy weight is also discussed.  BP/Weight 10/02/2014 04/02/2014 03/21/2014 02/09/2014 01/17/2014 [id number]  Systolic BP Q000111Q 123456 0000000 0000000 123456 Q000111Q [id number]  Diastolic BP 78 72 80 74 78 72 82  Wt. (Lbs) 196 200 202.8 201 198 - 196.12  BMI 31.65 32.3 32.75 32.46 31.97 - 31.67

## 2014-10-07 NOTE — Progress Notes (Signed)
Subjective:    Patient ID: Kathryn Beck, female    DOB: 08/19/1955, 59 y.o.   MRN: [id number]  HPI The PT is here for follow up and re-evaluation of chronic medical conditions, medication management and review of any available recent lab and radiology data.  Preventive health is updated, specifically  Cancer screening and Immunization.   Has not seen endo for months due to problems with ins coverage, states her sugar is uncontrolled, fluctuates a lot. Not able to exercise currently, has a flare of her skin condition with red rash, which occurs during the hot months, has little energy, feels drained. The PT denies any adverse reactions to current medications since the last visit.      Review of Systems See HPI Denies recent fever or chills. Denies sinus pressure, nasal congestion, ear pain or sore throat. Denies chest congestion, productive cough or wheezing. Denies chest pains, palpitations and leg swelling Denies abdominal pain, nausea, vomiting,diarrhea or constipation.   Denies dysuria, frequency, hesitancy or incontinence. Denies joint pain, swelling and limitation in mobility. Denies headaches, seizures, numbness, or tingling. Denies depression, anxiety or insomnia.        Objective:   Physical Exam  BP 134/78 mmHg  Pulse 98  Resp 16  Ht 5\' 6"  (1.676 m)  Wt 196 lb (88.905 kg)  BMI 31.65 kg/m2  SpO2 98% Patient alert and oriented and in no cardiopulmonary distress.  HEENT: No facial asymmetry, EOMI,   oropharynx pink and moist.  Neck supple no JVD, no mass.  Chest: Clear to auscultation bilaterally.  CVS: S1, S2 no murmurs, no S3.Regular rate.  ABD: Soft non tender.   Ext: No edema  MS: Adequate ROM spine, shoulders, hips and knees.  Skin: erythematous rash noted mainly on face and upper extremities, open skin present where pt has scratched excessively  Psych: Good eye contact, normal affect. Memory intact not anxious or depressed appearing.  CNS: CN  2-12 intact, power,  normal throughout.no focal deficits noted.       Assessment & Plan:  Essential hypertension Controlled, no change in medication DASH diet and commitment to daily physical activity for a minimum of 30 minutes discussed and encouraged, as a part of hypertension management. The importance of attaining a healthy weight is also discussed.  BP/Weight 10/02/2014 04/02/2014 03/21/2014 02/09/2014 01/17/2014 [id number]  Systolic BP Q000111Q 123456 0000000 0000000 123456 Q000111Q [id number]  Diastolic BP 78 72 80 74 78 72 82  Wt. (Lbs) 196 200 202.8 201 198 - 196.12  BMI 31.65 32.3 32.75 32.46 31.97 - 31.67        Diabetes mellitus, insulin dependent (IDDM), uncontrolled Kathryn Beck is reminded of the importance of commitment to daily physical activity for 30 minutes or more, as able and the need to limit carbohydrate intake to 30 to 60 grams per meal to help with blood sugar control.   The need to take medication as prescribed, test blood sugar as directed, and to call between visits if there is a concern that blood sugar is uncontrolled is also discussed.   Kathryn Beck is reminded of the importance of daily foot exam, annual eye examination, and good blood sugar, blood pressure and cholesterol control.  Diabetic Labs Latest Ref Rng 10/02/2014 09/26/2014 05/04/2014 10/24/2013 10/13/2013  HbA1c <5.7 % 9.7(H) - - - 9.5(H)  Microalbumin <2.0 mg/dL 92.2(H) - - - -  Micro/Creat Ratio 0.0 - 30.0 mg/g 963.4(H) - - - -  Chol 0 - 200 mg/dL - 181  187 - -  HDL >=46 mg/dL - 32(L) 38(L) - -  Calc LDL 0 - 99 mg/dL - NOT CALC 89 - -  Triglycerides <150 mg/dL - 607(H) 298(H) - -  Creatinine 0.50 - 1.10 mg/dL 1.60(H) - 1.39(H) 1.41(H) -   BP/Weight 10/02/2014 04/02/2014 03/21/2014 02/09/2014 01/17/2014 [id number]  Systolic BP Q000111Q 123456 0000000 0000000 123456 Q000111Q [id number]  Diastolic BP 78 72 80 74 78 72 82  Wt. (Lbs) 196 200 202.8 201 198 - 196.12  BMI 31.65 32.3 32.75 32.46 31.97 - 31.67   Foot/eye exam completion dates  Latest Ref Rng 04/27/2014 03/21/2014  Eye Exam No Retinopathy No Retinopathy -  Foot Form Completion - - Done   Increase levemir dose and needs to re establish with endo      Obesity Improved. Patient re-educated about  the importance of commitment to a  minimum of 150 minutes of exercise per week.  The importance of healthy food choices with portion control discussed. Encouraged to start a food diary, count calories and to consider  joining a support group. Sample diet sheets offered. Goals set by the patient for the next several months.   Weight /BMI 10/02/2014 04/02/2014 03/21/2014  WEIGHT 196 lb 200 lb 202 lb 12.8 oz  HEIGHT 5\' 6"  5\' 6"  5\' 6"   BMI 31.65 kg/m2 32.3 kg/m2 32.75 kg/m2    Current exercise per week 30 minutes.   Mixed hyperlipidemia Markedly elevated TG, needs to reduce fried and fatty foods, will try  to add zetia also Hyperlipidemia:Low fat diet discussed and encouraged.   Lipid Panel  Lab Results  Component Value Date   CHOL 181 09/26/2014   HDL 32* 09/26/2014   LDLCALC NOT CALC 09/26/2014   LDLDIRECT 90 09/26/2014   TRIG 607* 09/26/2014   CHOLHDL 5.7 09/26/2014        Heat sensitivity Current flare of erythematous rash on face and exposed areas of her arms, will send hydroxyzine for itch  Need for diphtheria-tetanus-pertussis (Tdap) vaccine, adult/adolescent After obtaining informed consent, the vaccine is  administered by LPN.

## 2014-10-07 NOTE — Assessment & Plan Note (Signed)
Improved. Patient re-educated about  the importance of commitment to a  minimum of 150 minutes of exercise per week.  The importance of healthy food choices with portion control discussed. Encouraged to start a food diary, count calories and to consider  joining a support group. Sample diet sheets offered. Goals set by the patient for the next several months.   Weight /BMI 10/02/2014 04/02/2014 03/21/2014  WEIGHT 196 lb 200 lb 202 lb 12.8 oz  HEIGHT 5\' 6"  5\' 6"  5\' 6"   BMI 31.65 kg/m2 32.3 kg/m2 32.75 kg/m2    Current exercise per week 30 minutes.

## 2014-10-07 NOTE — Assessment & Plan Note (Signed)
Kathryn Beck is reminded of the importance of commitment to daily physical activity for 30 minutes or more, as able and the need to limit carbohydrate intake to 30 to 60 grams per meal to help with blood sugar control.   The need to take medication as prescribed, test blood sugar as directed, and to call between visits if there is a concern that blood sugar is uncontrolled is also discussed.   Kathryn Beck is reminded of the importance of daily foot exam, annual eye examination, and good blood sugar, blood pressure and cholesterol control.  Diabetic Labs Latest Ref Rng 10/02/2014 09/26/2014 05/04/2014 10/24/2013 10/13/2013  HbA1c <5.7 % 9.7(H) - - - 9.5(H)  Microalbumin <2.0 mg/dL 92.2(H) - - - -  Micro/Creat Ratio 0.0 - 30.0 mg/g 963.4(H) - - - -  Chol 0 - 200 mg/dL - 181 187 - -  HDL >=46 mg/dL - 32(L) 38(L) - -  Calc LDL 0 - 99 mg/dL - NOT CALC 89 - -  Triglycerides <150 mg/dL - 607(H) 298(H) - -  Creatinine 0.50 - 1.10 mg/dL 1.60(H) - 1.39(H) 1.41(H) -   BP/Weight 10/02/2014 04/02/2014 03/21/2014 02/09/2014 01/17/2014 [id number]  Systolic BP Q000111Q 123456 0000000 0000000 123456 Q000111Q [id number]  Diastolic BP 78 72 80 74 78 72 82  Wt. (Lbs) 196 200 202.8 201 198 - 196.12  BMI 31.65 32.3 32.75 32.46 31.97 - 31.67   Foot/eye exam completion dates Latest Ref Rng 04/27/2014 03/21/2014  Eye Exam No Retinopathy No Retinopathy -  Foot Form Completion - - Done   Increase levemir dose and needs to re establish with endo

## 2014-10-07 NOTE — Assessment & Plan Note (Signed)
After obtaining informed consent, the vaccine is  administered by LPN.  

## 2015-01-27 ENCOUNTER — Other Ambulatory Visit: Payer: Self-pay | Admitting: Family Medicine

## 2015-01-30 ENCOUNTER — Ambulatory Visit: Payer: 59 | Admitting: Family Medicine

## 2015-01-31 ENCOUNTER — Other Ambulatory Visit: Payer: Self-pay | Admitting: "Endocrinology

## 2015-03-12 ENCOUNTER — Ambulatory Visit (INDEPENDENT_AMBULATORY_CARE_PROVIDER_SITE_OTHER): Payer: 59 | Admitting: Family Medicine

## 2015-03-12 ENCOUNTER — Other Ambulatory Visit: Payer: Self-pay | Admitting: Family Medicine

## 2015-03-12 ENCOUNTER — Encounter: Payer: Self-pay | Admitting: Family Medicine

## 2015-03-12 VITALS — BP 146/72 | HR 68 | Resp 18 | Wt 198.1 lb

## 2015-03-12 DIAGNOSIS — IMO0002 Reserved for concepts with insufficient information to code with codable children: Secondary | ICD-10-CM

## 2015-03-12 DIAGNOSIS — Z23 Encounter for immunization: Secondary | ICD-10-CM

## 2015-03-12 DIAGNOSIS — I10 Essential (primary) hypertension: Secondary | ICD-10-CM

## 2015-03-12 DIAGNOSIS — E1069 Type 1 diabetes mellitus with other specified complication: Secondary | ICD-10-CM

## 2015-03-12 DIAGNOSIS — E0849 Diabetes mellitus due to underlying condition with other diabetic neurological complication: Secondary | ICD-10-CM

## 2015-03-12 DIAGNOSIS — E1065 Type 1 diabetes mellitus with hyperglycemia: Secondary | ICD-10-CM

## 2015-03-12 DIAGNOSIS — E782 Mixed hyperlipidemia: Secondary | ICD-10-CM

## 2015-03-12 DIAGNOSIS — Z114 Encounter for screening for human immunodeficiency virus [HIV]: Secondary | ICD-10-CM

## 2015-03-12 DIAGNOSIS — J302 Other seasonal allergic rhinitis: Secondary | ICD-10-CM

## 2015-03-12 DIAGNOSIS — E669 Obesity, unspecified: Secondary | ICD-10-CM

## 2015-03-12 MED ORDER — GABAPENTIN 100 MG PO CAPS: 100.0000 mg | ORAL_CAPSULE | Freq: Three times a day (TID) | ORAL | Status: DC

## 2015-03-12 NOTE — Progress Notes (Signed)
Subjective:    Patient ID: Kathryn Beck, female    DOB: 1955-11-15, 59 y.o.   MRN: [id number]  HPI   Kathryn Beck     MRN: [id number]      DOB: Dec 19, 1955   HPI Kathryn Beck is here for follow up and re-evaluation of chronic medical conditions, medication management and review of any available recent lab and radiology data.  Preventive health is updated, specifically  Cancer screening and Immunization.   Needs to  Re establish with endo, reports elevated blood sugars, and she is not testing consistently. The PT denies any adverse reactions to current medications since the last visit.    ROS Denies recent fever or chills. Denies sinus pressure, nasal congestion, ear pain or sore throat. Denies chest congestion, productive cough or wheezing. Denies chest pains, palpitations and leg swelling Denies abdominal pain, nausea, vomiting,diarrhea or constipation.   Denies dysuria, frequency, hesitancy or incontinence. Denies uncontrolled  joint pain, swelling and limitation in mobility. Denies headaches, seizures, numbness, or tingling. Denies depression, anxiety or insomnia. Denies skin break down or rash.   PE  BP 146/72 mmHg  Pulse 68  Resp 18  Wt 198 lb 1.3 oz (89.848 kg)  SpO2 98%  Patient alert and oriented and in no cardiopulmonary distress.  HEENT: No facial asymmetry, EOMI,   oropharynx pink and moist.  Neck supple no JVD, no mass.  Chest: Clear to auscultation bilaterally.  CVS: S1, S2 no murmurs, no S3.Regular rate.  ABD: Soft non tender.   Ext: No edema  MS: Adequate ROM spine, shoulders, hips and knees.  Skin: Intact, no ulcerations or rash noted.  Psych: Good eye contact, normal affect. Memory intact not anxious or depressed appearing.  CNS: CN 2-12 intact, power,  normal throughout.no focal deficits noted.   Assessment & Plan   Diabetes mellitus, insulin dependent (IDDM), uncontrolled Deteriorated, needs to return to endo, states she is  "working on her insurance [name] is reminded of the importance of commitment to daily physical activity for 30 minutes or more, as able and the need to limit carbohydrate intake to 30 to 60 grams per meal to help with blood sugar control.   The need to take medication as prescribed, test blood sugar as directed, and to call between visits if there is a concern that blood sugar is uncontrolled is also discussed.   [name] is reminded of the importance of daily foot exam, annual eye examination, and good blood sugar, blood pressure and cholesterol control.  Diabetic Labs Latest Ref Rng 04/25/2015 03/12/2015 10/02/2014 09/26/2014 05/04/2014  HbA1c <5.7 % - 10.6(H) 9.7(H) - -  Microalbumin <2.0 mg/dL - - 92.2(H) - -  Micro/Creat Ratio 0.0 - 30.0 mg/g - - 963.4(H) - -  Chol 125 - 200 mg/dL - 245(H) - 181 187  HDL >=46 mg/dL - 38(L) - 32(L) 38(L)  Calc LDL <130 mg/dL - 134(H) - NOT CALC 89  Triglycerides <150 mg/dL - 366(H) - 607(H) 298(H)  Creatinine 0.50 - 1.05 mg/dL 1.97(H) - 1.60(H) - 1.39(H)   BP/Weight 04/25/2015 03/12/2015 10/02/2014 04/02/2014 03/21/2014 02/09/2014 [id number]  Systolic BP A999333 123456 Q000111Q 123456 0000000 0000000 123456  Diastolic BP 80 72 78 72 80 74 78  Wt. (Lbs) 203 198.08 196 200 202.8 201 198  BMI 32.78 31.99 31.65 32.3 32.75 32.46 31.97   Foot/eye exam completion dates Latest Ref Rng 03/12/2015 04/27/2014  Eye Exam No Retinopathy - No Retinopathy  Foot Form Completion - Done -  Essential hypertension UnControlled, no change in medication, will hope that Dash diet and weight loss and reduced sodium will help, recently saw nephrology who made no change DASH diet and commitment to daily physical activity for a minimum of 30 minutes discussed and encouraged, as a part of hypertension management. The importance of attaining a healthy weight is also discussed.  BP/Weight 04/25/2015 03/12/2015 10/02/2014 04/02/2014 03/21/2014 02/09/2014 [id number]  Systolic BP A999333 123456 Q000111Q 123456 158  0000000 123456  Diastolic BP 80 72 78 72 80 74 78  Wt. (Lbs) 203 198.08 196 200 202.8 201 198  BMI 32.78 31.99 31.65 32.3 32.75 32.46 31.97        Mixed hyperlipidemia Hyperlipidemia:Low fat diet discussed and encouraged.   Lipid Panel  Lab Results  Component Value Date   CHOL 245* 03/12/2015   HDL 38* 03/12/2015   LDLCALC 134* 03/12/2015   LDLDIRECT 90 09/26/2014   TRIG 366* 03/12/2015   CHOLHDL 6.4* 03/12/2015   Uncontrolled, encouraged to work on diet, statins are contraindicated in her due to elevated LFT    Obesity Deteriorated. Patient re-educated about  the importance of commitment to a  minimum of 150 minutes of exercise per week.  The importance of healthy food choices with portion control discussed. Encouraged to start a food diary, count calories and to consider  joining a support group. Sample diet sheets offered. Goals set by the patient for the next several months.   Weight /BMI 04/25/2015 03/12/2015 10/02/2014  WEIGHT 203 lb 198 lb 1.3 oz 196 lb  HEIGHT 5\' 6"  - 5\' 6"   BMI 32.78 kg/m2 31.99 kg/m2 31.65 kg/m2    Current exercise per week 60 minutes.   Seasonal allergies Controlled, no change in medication        Review of Systems     Objective:   Physical Exam        Assessment & Plan:

## 2015-03-12 NOTE — Patient Instructions (Addendum)
F/u in 4 month, call if you need me sooner  Take all medication daily as prescribed please, blood pressure slightly high today since out of one of your medication  Labs today  Flu vac today  Foot exam is good but please examine feet daily  Please work on good  health habits so that your health will improve. 1. Commitment to daily physical activity for 30 to 60  minutes, if you are able to do this.  2. Commitment to wise food choices. Aim for half of your  food intake to be vegetable and fruit, one quarter starchy foods, and one quarter protein. Try to eat on a regular schedule  3 meals per day, snacking between meals should be limited to vegetables or fruits or small portions of nuts. 64 ounces of water per day is generally recommended, unless you have specific health conditions, like heart failure or kidney failure where you will need to limit fluid intake.  3. Commitment to sufficient and a  good quality of physical and mental rest daily, generally between 6 to 8 hours per day.  WITH PERSISTANCE AND PERSEVERANCE, THE IMPOSSIBLE , BECOMES THE NORM! Thanks for choosing [facility], we consider it a privelige to serve you. All the best for 2017!

## 2015-03-13 LAB — HEMOGLOBIN A1C
HEMOGLOBIN A1C: 10.6 % — AB (ref <5.7)
MEAN PLASMA GLUCOSE: 258 mg/dL — AB (ref <117)

## 2015-03-13 LAB — LIPID PANEL
Cholesterol: 245 mg/dL — ABNORMAL HIGH (ref 125–200)
HDL: 38 mg/dL — ABNORMAL LOW (ref >=46)
LDL CALC: 134 mg/dL — AB (ref <130)
Total CHOL/HDL Ratio: 6.4 Ratio — ABNORMAL HIGH (ref <=5.0)
Triglycerides: 366 mg/dL — ABNORMAL HIGH (ref <150)
VLDL: 73 mg/dL — ABNORMAL HIGH (ref <30)

## 2015-03-13 LAB — HIV ANTIBODY (ROUTINE TESTING W REFLEX): HIV: NONREACTIVE

## 2015-03-14 LAB — HEPATIC FUNCTION PANEL
ALBUMIN: 4.2 g/dL (ref 3.6–5.1)
ALT: 22 U/L (ref 6–29)
AST: 21 U/L (ref 10–35)
Alkaline Phosphatase: 84 U/L (ref 33–130)
Bilirubin, Direct: <0.1 mg/dL (ref <=0.2)
TOTAL PROTEIN: 7.6 g/dL (ref 6.1–8.1)
Total Bilirubin: 0.3 mg/dL (ref 0.2–1.2)

## 2015-03-15 ENCOUNTER — Other Ambulatory Visit: Payer: Self-pay

## 2015-03-15 ENCOUNTER — Telehealth: Payer: Self-pay

## 2015-03-15 DIAGNOSIS — E782 Mixed hyperlipidemia: Secondary | ICD-10-CM

## 2015-03-15 DIAGNOSIS — I10 Essential (primary) hypertension: Secondary | ICD-10-CM

## 2015-03-15 MED ORDER — EZETIMIBE 10 MG PO TABS: 10.0000 mg | ORAL_TABLET | Freq: Every day | ORAL | Status: DC

## 2015-03-15 NOTE — Telephone Encounter (Signed)
Labs ordered and mailed to patient.  

## 2015-03-15 NOTE — Telephone Encounter (Signed)
-----   Message from Fayrene Helper, MD sent at 03/14/2015  3:02 PM EST ----- Normal liver function with very high cholesterol, needs zetia 10 mg, need to PA, has had marked elevation of liver enzymes with muscle cramps in the past Needs rept fasting lipid, and hepatic iin 3 month

## 2015-03-20 ENCOUNTER — Other Ambulatory Visit: Payer: Self-pay | Admitting: "Endocrinology

## 2015-03-29 ENCOUNTER — Other Ambulatory Visit: Payer: Self-pay | Admitting: Family Medicine

## 2015-04-05 ENCOUNTER — Other Ambulatory Visit: Payer: Self-pay | Admitting: Family Medicine

## 2015-04-05 ENCOUNTER — Other Ambulatory Visit: Payer: Self-pay | Admitting: "Endocrinology

## 2015-04-16 ENCOUNTER — Other Ambulatory Visit (HOSPITAL_COMMUNITY): Payer: Self-pay | Admitting: Respiratory Therapy

## 2015-04-16 DIAGNOSIS — F40240 Claustrophobia: Secondary | ICD-10-CM

## 2015-04-16 DIAGNOSIS — G4733 Obstructive sleep apnea (adult) (pediatric): Secondary | ICD-10-CM

## 2015-04-16 DIAGNOSIS — R5383 Other fatigue: Secondary | ICD-10-CM

## 2015-04-16 DIAGNOSIS — I10 Essential (primary) hypertension: Secondary | ICD-10-CM

## 2015-04-22 ENCOUNTER — Other Ambulatory Visit: Payer: Self-pay

## 2015-04-22 MED ORDER — HYDROCODONE-ACETAMINOPHEN 5-325 MG PO TABS: ORAL_TABLET | ORAL | Status: DC

## 2015-04-25 ENCOUNTER — Ambulatory Visit (INDEPENDENT_AMBULATORY_CARE_PROVIDER_SITE_OTHER): Payer: 59 | Admitting: Family Medicine

## 2015-04-25 ENCOUNTER — Encounter: Payer: Self-pay | Admitting: Family Medicine

## 2015-04-25 VITALS — BP 142/80 | HR 102 | Resp 16 | Ht 66.0 in | Wt 203.0 lb

## 2015-04-25 DIAGNOSIS — E1069 Type 1 diabetes mellitus with other specified complication: Secondary | ICD-10-CM

## 2015-04-25 DIAGNOSIS — I10 Essential (primary) hypertension: Secondary | ICD-10-CM

## 2015-04-25 DIAGNOSIS — E790 Hyperuricemia without signs of inflammatory arthritis and tophaceous disease: Secondary | ICD-10-CM

## 2015-04-25 DIAGNOSIS — E1065 Type 1 diabetes mellitus with hyperglycemia: Secondary | ICD-10-CM

## 2015-04-25 DIAGNOSIS — M791 Myalgia, unspecified site: Secondary | ICD-10-CM

## 2015-04-25 DIAGNOSIS — N183 Chronic kidney disease, stage 3 unspecified: Secondary | ICD-10-CM

## 2015-04-25 DIAGNOSIS — E669 Obesity, unspecified: Secondary | ICD-10-CM

## 2015-04-25 DIAGNOSIS — R42 Dizziness and giddiness: Secondary | ICD-10-CM | POA: Diagnosis not present

## 2015-04-25 DIAGNOSIS — R0989 Other specified symptoms and signs involving the circulatory and respiratory systems: Secondary | ICD-10-CM

## 2015-04-25 DIAGNOSIS — R319 Hematuria, unspecified: Secondary | ICD-10-CM

## 2015-04-25 DIAGNOSIS — IMO0002 Reserved for concepts with insufficient information to code with codable children: Secondary | ICD-10-CM

## 2015-04-25 DIAGNOSIS — D539 Nutritional anemia, unspecified: Secondary | ICD-10-CM

## 2015-04-25 DIAGNOSIS — E782 Mixed hyperlipidemia: Secondary | ICD-10-CM

## 2015-04-25 LAB — POCT URINALYSIS DIPSTICK
Bilirubin, UA: NEGATIVE
Glucose, UA: 500
Ketones, UA: NEGATIVE
LEUKOCYTES UA: NEGATIVE
NITRITE UA: NEGATIVE
PH UA: 7
Spec Grav, UA: 1.02
UROBILINOGEN UA: 0.2

## 2015-04-25 NOTE — Patient Instructions (Addendum)
F/u in April as befoore , call if you need me sooner   Labs today, including additional urine test, if there is still blood in urine you wil be referred to urologist  You are referred for ultrasound of kidneys also of the arteries in your neck since you are light headed  INo evidence of kidney infection  Hope that you will be ablle to get  Sleep apnea treated soon , this will inmperove how you feel. No medication changes at this time  [facility] you feel better soon

## 2015-04-25 NOTE — Progress Notes (Signed)
Subjective:    Patient ID: Kathryn Beck, female    DOB: [birth year], 60 y.o.   MRN: [id number]  HPI Cramps and poor sleep , nausea, jittery for 2 weeks, feels weak and shaky at times, smells ammonia, blood sugar when tested is high Reports light headedness intermittently States she recently saw her nephrologist and got a good report. Still neds to get back to endo, states she knows that her blood sugars are high sometimes over 400   Review of Systems See HPI Denies recent fever or chills. Denies sinus pressure, nasal congestion, ear pain or sore throat. Denies chest congestion, productive cough or wheezing. Denies chest pains, palpitations and leg swelling Denies  vomiting,diarrhea or constipation.   . Chronic joint pain unchanged Denies headaches, seizures, Denies depression, anxiety or insomnia. Denies skin break down or rash.        Objective:   Physical Exam BP 142/80 mmHg  Pulse 102  Resp 16  Ht 5\' 6"  (1.676 m)  Wt 203 lb (92.08 kg)  BMI 32.78 kg/m2  SpO2 95% Patient alert and oriented and in no cardiopulmonary distress.  HEENT: No facial asymmetry, EOMI,   oropharynx pink and moist.  Neck supple no JVD, no mass. Bilateral carotid bruits Chest: Clear to auscultation bilaterally.  CVS: S1, S2 no murmurs, no S3.Regular rate.  ABD: Soft non tender.   Ext: No edema  MS: Adequate ROM spine, shoulders, hips and knees.  Skin: Intact, no ulcerations or rash noted.  Psych: Good eye contact, normal affect. Memory intact not anxious or depressed appearing.  CNS: CN 2-12 intact, power,  normal throughout.no focal deficits noted.      Assessment & Plan:  Diabetes mellitus, insulin dependent (IDDM), uncontrolled Ms. Babayan is reminded of the importance of commitment to daily physical activity for 30 minutes or more, as able and the need to limit carbohydrate intake to 30 to 60 grams per meal to help with blood sugar control.   The need to take medication as  prescribed, test blood sugar as directed, and to call between visits if there is a concern that blood sugar is uncontrolled is also discussed.   Ms. Dodrill is reminded of the importance of daily foot exam, annual eye examination, and good blood sugar, blood pressure and cholesterol control. Worsened, pt non compliant with treatment plan of endo and not keeping appts as she should due to insurance purposes reportedly, I encouraged her to call the endo office re elevated blood sugars and geeneral malaise until she can get in  Diabetic Labs Latest Ref Rng 04/25/2015 03/12/2015 10/02/2014 09/26/2014 05/04/2014  HbA1c <5.7 % - 10.6(H) 9.7(H) - -  Microalbumin <2.0 mg/dL - - 92.2(H) - -  Micro/Creat Ratio 0.0 - 30.0 mg/g - - 963.4(H) - -  Chol 125 - 200 mg/dL - 245(H) - 181 187  HDL >=46 mg/dL - 38(L) - 32(L) 38(L)  Calc LDL <130 mg/dL - 134(H) - NOT CALC 89  Triglycerides <150 mg/dL - 366(H) - 607(H) 298(H)  Creatinine 0.50 - 1.05 mg/dL 1.97(H) - 1.60(H) - 1.39(H)   BP/Weight 04/25/2015 03/12/2015 10/02/2014 04/02/2014 03/21/2014 02/09/2014 123XX123  Systolic BP A999333 123456 Q000111Q 123456 0000000 0000000 123456  Diastolic BP 80 72 78 72 80 74 78  Wt. (Lbs) 203 198.08 196 200 202.8 201 198  BMI 32.78 31.99 31.65 32.3 32.75 32.46 31.97   Foot/eye exam completion dates Latest Ref Rng 03/12/2015 04/27/2014  Eye Exam No Retinopathy - No Retinopathy  Foot Form Completion -  Done -         CKD (chronic kidney disease) stage 3, GFR 30-59 ml/min Deteriorating due to uncontrolled diabetes and hypertension, avoidance of NSAIDS stressed and need to improve chronic illness Continue to follow with nephrology Renal US,  UA in office has trace blood will submit for re evaluation  Anemia, deficiency Resolved, lymphocytosis noted, will follow, may refer to hematology in the future  Mixed hyperlipidemia Hyperlipidemia:Low fat diet discussed and encouraged.   Lipid Panel  Lab Results  Component Value Date   CHOL 245*  03/12/2015   HDL 38* 03/12/2015   LDLCALC 134* 03/12/2015   LDLDIRECT 90 09/26/2014   TRIG 366* 03/12/2015   CHOLHDL 6.4* 03/12/2015    uncontrolled     Obesity Deteriorated. Patient re-educated about  the importance of commitment to a  minimum of 150 minutes of exercise per week.  The importance of healthy food choices with portion control discussed. Encouraged to start a food diary, count calories and to consider  joining a support group. Sample diet sheets offered. Goals set by the patient for the next several months.   Weight /BMI 04/25/2015 03/12/2015 10/02/2014  WEIGHT 203 lb 198 lb 1.3 oz 196 lb  HEIGHT 5\' 6"  - 5\' 6"   BMI 32.78 kg/m2 31.99 kg/m2 31.65 kg/m2    Current exercise per week 40 minutes.   Light headedness Light headed, hyperlipidemia and bruit, refer for carotid US

## 2015-04-26 LAB — CBC WITH DIFFERENTIAL/PLATELET
BASOS ABS: 0.1 10*3/uL (ref 0.0–0.1)
Basophils Relative: 1 % (ref 0–1)
EOS PCT: 1 % (ref 0–5)
Eosinophils Absolute: 0.1 10*3/uL (ref 0.0–0.7)
HEMATOCRIT: 38.7 % (ref 36.0–46.0)
Hemoglobin: 12.1 g/dL (ref 12.0–15.0)
LYMPHS ABS: 4 10*3/uL (ref 0.7–4.0)
LYMPHS PCT: 53 % — AB (ref 12–46)
MCH: 22 pg — AB (ref 26.0–34.0)
MCHC: 31.3 g/dL (ref 30.0–36.0)
MCV: 70.4 fL — AB (ref 78.0–100.0)
MONOS PCT: 7 % (ref 3–12)
MPV: 10.6 fL (ref 8.6–12.4)
Monocytes Absolute: 0.5 10*3/uL (ref 0.1–1.0)
NEUTROS PCT: 38 % — AB (ref 43–77)
Neutro Abs: 2.9 10*3/uL (ref 1.7–7.7)
Platelets: 358 10*3/uL (ref 150–400)
RBC: 5.5 MIL/uL — ABNORMAL HIGH (ref 3.87–5.11)
RDW: 16.2 % — ABNORMAL HIGH (ref 11.5–15.5)
WBC: 7.6 10*3/uL (ref 4.0–10.5)

## 2015-04-26 LAB — URINALYSIS
Bilirubin Urine: NEGATIVE
HGB URINE DIPSTICK: NEGATIVE
KETONES UR: NEGATIVE
LEUKOCYTES UA: NEGATIVE
NITRITE: NEGATIVE
Specific Gravity, Urine: 1.018 (ref 1.001–1.035)
pH: 7 (ref 5.0–8.0)

## 2015-04-26 LAB — TSH: TSH: 2.006 u[IU]/mL (ref 0.350–4.500)

## 2015-04-26 LAB — FERRITIN: Ferritin: 315 ng/mL — ABNORMAL HIGH (ref 10–291)

## 2015-04-26 LAB — GLUCOSE, POCT (MANUAL RESULT ENTRY): POC GLUCOSE: 215 mg/dL — AB (ref 70–99)

## 2015-04-26 LAB — VITAMIN D 25 HYDROXY (VIT D DEFICIENCY, FRACTURES): VIT D 25 HYDROXY: 16 ng/mL — AB (ref 30–100)

## 2015-04-27 ENCOUNTER — Ambulatory Visit (HOSPITAL_BASED_OUTPATIENT_CLINIC_OR_DEPARTMENT_OTHER): Payer: 59

## 2015-04-27 LAB — COMPLETE METABOLIC PANEL WITH GFR
ALT: 24 U/L (ref 6–29)
AST: 23 U/L (ref 10–35)
Albumin: 4.1 g/dL (ref 3.6–5.1)
Alkaline Phosphatase: 87 U/L (ref 33–130)
BILIRUBIN TOTAL: 0.2 mg/dL (ref 0.2–1.2)
BUN: 30 mg/dL — ABNORMAL HIGH (ref 7–25)
CHLORIDE: 101 mmol/L (ref 98–110)
CO2: 28 mmol/L (ref 20–31)
CREATININE: 1.97 mg/dL — AB (ref 0.50–1.05)
Calcium: 9.7 mg/dL (ref 8.6–10.4)
GFR, EST AFRICAN AMERICAN: 31 mL/min — AB (ref >=60)
GFR, Est Non African American: 27 mL/min — ABNORMAL LOW (ref >=60)
GLUCOSE: 188 mg/dL — AB (ref 65–99)
Potassium: 5.3 mmol/L (ref 3.5–5.3)
SODIUM: 138 mmol/L (ref 135–146)
TOTAL PROTEIN: 7.2 g/dL (ref 6.1–8.1)

## 2015-04-27 LAB — URIC ACID: URIC ACID, SERUM: 9.7 mg/dL — AB (ref 2.4–7.0)

## 2015-04-27 LAB — IRON: Iron: 56 ug/dL (ref 45–160)

## 2015-04-27 LAB — CK: CK TOTAL: 224 U/L — AB (ref 7–177)

## 2015-04-30 LAB — CREATININE KINASE MB
CK, MB: 0.9 ng/mL (ref 0.0–5.0)
RELATIVE INDEX: 0.4 (ref 0.0–4.0)

## 2015-05-01 ENCOUNTER — Ambulatory Visit (HOSPITAL_COMMUNITY)
Admission: RE | Admit: 2015-05-01 | Discharge: 2015-05-01 | Disposition: A | Discharge status: Home / Self Care | Payer: 59 | Source: Ambulatory Visit | Attending: Family Medicine | Admitting: Family Medicine

## 2015-05-01 DIAGNOSIS — E119 Type 2 diabetes mellitus without complications: Secondary | ICD-10-CM | POA: Insufficient documentation

## 2015-05-01 DIAGNOSIS — N281 Cyst of kidney, acquired: Secondary | ICD-10-CM | POA: Diagnosis not present

## 2015-05-01 DIAGNOSIS — R319 Hematuria, unspecified: Secondary | ICD-10-CM | POA: Insufficient documentation

## 2015-05-01 DIAGNOSIS — I10 Essential (primary) hypertension: Secondary | ICD-10-CM | POA: Diagnosis not present

## 2015-05-03 ENCOUNTER — Other Ambulatory Visit: Payer: Self-pay | Admitting: Family Medicine

## 2015-05-03 ENCOUNTER — Telehealth: Payer: Self-pay | Admitting: Family Medicine

## 2015-05-03 DIAGNOSIS — N281 Cyst of kidney, acquired: Secondary | ICD-10-CM

## 2015-05-03 NOTE — Telephone Encounter (Signed)
Pls try to contact pt today re US kidney and AT THE SAME TIME review her blood test results, as there are medication changes needed, this is in Courtney's box, thanks!

## 2015-05-05 NOTE — Assessment & Plan Note (Signed)
UnControlled, no change in medication, will hope that Dash diet and weight loss and reduced sodium will help, recently saw nephrology who made no change DASH diet and commitment to daily physical activity for a minimum of 30 minutes discussed and encouraged, as a part of hypertension management. The importance of attaining a healthy weight is also discussed.  BP/Weight 04/25/2015 03/12/2015 10/02/2014 04/02/2014 03/21/2014 02/09/2014 123XX123  Systolic BP A999333 123456 Q000111Q 123456 0000000 0000000 123456  Diastolic BP 80 72 78 72 80 74 78  Wt. (Lbs) 203 198.08 196 200 202.8 201 198  BMI 32.78 31.99 31.65 32.3 32.75 32.46 31.97

## 2015-05-05 NOTE — Assessment & Plan Note (Signed)
Controlled, no change in medication  

## 2015-05-05 NOTE — Assessment & Plan Note (Signed)
Deteriorated. Patient re-educated about  the importance of commitment to a  minimum of 150 minutes of exercise per week.  The importance of healthy food choices with portion control discussed. Encouraged to start a food diary, count calories and to consider  joining a support group. Sample diet sheets offered. Goals set by the patient for the next several months.   Weight /BMI 04/25/2015 03/12/2015 10/02/2014  WEIGHT 203 lb 198 lb 1.3 oz 196 lb  HEIGHT 5\' 6"  - 5\' 6"   BMI 32.78 kg/m2 31.99 kg/m2 31.65 kg/m2    Current exercise per week 60 minutes.

## 2015-05-05 NOTE — Assessment & Plan Note (Signed)
Deteriorated, needs to return to endo, states she is "working on her insurance Kathryn Beck is reminded of the importance of commitment to daily physical activity for 30 minutes or more, as able and the need to limit carbohydrate intake to 30 to 60 grams per meal to help with blood sugar control.   The need to take medication as prescribed, test blood sugar as directed, and to call between visits if there is a concern that blood sugar is uncontrolled is also discussed.   Kathryn Beck is reminded of the importance of daily foot exam, annual eye examination, and good blood sugar, blood pressure and cholesterol control.  Diabetic Labs Latest Ref Rng 04/25/2015 03/12/2015 10/02/2014 09/26/2014 05/04/2014  HbA1c <5.7 % - 10.6(H) 9.7(H) - -  Microalbumin <2.0 mg/dL - - 92.2(H) - -  Micro/Creat Ratio 0.0 - 30.0 mg/g - - 963.4(H) - -  Chol 125 - 200 mg/dL - 245(H) - 181 187  HDL >=46 mg/dL - 38(L) - 32(L) 38(L)  Calc LDL <130 mg/dL - 134(H) - NOT CALC 89  Triglycerides <150 mg/dL - 366(H) - 607(H) 298(H)  Creatinine 0.50 - 1.05 mg/dL 1.97(H) - 1.60(H) - 1.39(H)   BP/Weight 04/25/2015 03/12/2015 10/02/2014 04/02/2014 03/21/2014 02/09/2014 [id number]  Systolic BP A999333 123456 Q000111Q 123456 0000000 0000000 123456  Diastolic BP 80 72 78 72 80 74 78  Wt. (Lbs) 203 198.08 196 200 202.8 201 198  BMI 32.78 31.99 31.65 32.3 32.75 32.46 31.97   Foot/eye exam completion dates Latest Ref Rng 03/12/2015 04/27/2014  Eye Exam No Retinopathy - No Retinopathy  Foot Form Completion - Done -

## 2015-05-05 NOTE — Assessment & Plan Note (Signed)
Hyperlipidemia:Low fat diet discussed and encouraged.   Lipid Panel  Lab Results  Component Value Date   CHOL 245* 03/12/2015   HDL 38* 03/12/2015   LDLCALC 134* 03/12/2015   LDLDIRECT 90 09/26/2014   TRIG 366* 03/12/2015   CHOLHDL 6.4* 03/12/2015   Uncontrolled, encouraged to work on diet, statins are contraindicated in her due to elevated LFT

## 2015-05-06 ENCOUNTER — Other Ambulatory Visit: Payer: Self-pay

## 2015-05-06 MED ORDER — GLUCOSE BLOOD VI STRP: ORAL_STRIP | Status: DC

## 2015-05-06 NOTE — Assessment & Plan Note (Signed)
Ms. Yoshioka is reminded of the importance of commitment to daily physical activity for 30 minutes or more, as able and the need to limit carbohydrate intake to 30 to 60 grams per meal to help with blood sugar control.   The need to take medication as prescribed, test blood sugar as directed, and to call between visits if there is a concern that blood sugar is uncontrolled is also discussed.   Ms. Church is reminded of the importance of daily foot exam, annual eye examination, and good blood sugar, blood pressure and cholesterol control. Worsened, pt non compliant with treatment plan of endo and not keeping appts as she should due to insurance purposes reportedly, I encouraged her to call the endo office re elevated blood sugars and geeneral malaise until she can get in  Diabetic Labs Latest Ref Rng 04/25/2015 03/12/2015 10/02/2014 09/26/2014 05/04/2014  HbA1c <5.7 % - 10.6(H) 9.7(H) - -  Microalbumin <2.0 mg/dL - - 92.2(H) - -  Micro/Creat Ratio 0.0 - 30.0 mg/g - - 963.4(H) - -  Chol 125 - 200 mg/dL - 245(H) - 181 187  HDL >=46 mg/dL - 38(L) - 32(L) 38(L)  Calc LDL <130 mg/dL - 134(H) - NOT CALC 89  Triglycerides <150 mg/dL - 366(H) - 607(H) 298(H)  Creatinine 0.50 - 1.05 mg/dL 1.97(H) - 1.60(H) - 1.39(H)   BP/Weight 04/25/2015 03/12/2015 10/02/2014 04/02/2014 03/21/2014 02/09/2014 123XX123  Systolic BP A999333 123456 Q000111Q 123456 0000000 0000000 123456  Diastolic BP 80 72 78 72 80 74 78  Wt. (Lbs) 203 198.08 196 200 202.8 201 198  BMI 32.78 31.99 31.65 32.3 32.75 32.46 31.97   Foot/eye exam completion dates Latest Ref Rng 03/12/2015 04/27/2014  Eye Exam No Retinopathy - No Retinopathy  Foot Form Completion - Done -

## 2015-05-06 NOTE — Telephone Encounter (Signed)
Patient aware of both results

## 2015-05-06 NOTE — Assessment & Plan Note (Signed)
Hyperlipidemia:Low fat diet discussed and encouraged.   Lipid Panel  Lab Results  Component Value Date   CHOL 245* 03/12/2015   HDL 38* 03/12/2015   LDLCALC 134* 03/12/2015   LDLDIRECT 90 09/26/2014   TRIG 366* 03/12/2015   CHOLHDL 6.4* 03/12/2015    uncontrolled

## 2015-05-06 NOTE — Assessment & Plan Note (Signed)
Deteriorating due to uncontrolled diabetes and hypertension, avoidance of NSAIDS stressed and need to improve chronic illness Continue to follow with nephrology Renal US,  UA in office has trace blood will submit for re evaluation

## 2015-05-06 NOTE — Assessment & Plan Note (Addendum)
Resolved, lymphocytosis noted, will follow, may refer to hematology in the future

## 2015-05-06 NOTE — Assessment & Plan Note (Signed)
Deteriorated. Patient re-educated about  the importance of commitment to a  minimum of 150 minutes of exercise per week.  The importance of healthy food choices with portion control discussed. Encouraged to start a food diary, count calories and to consider  joining a support group. Sample diet sheets offered. Goals set by the patient for the next several months.   Weight /BMI 04/25/2015 03/12/2015 10/02/2014  WEIGHT 203 lb 198 lb 1.3 oz 196 lb  HEIGHT 5\' 6"  - 5\' 6"   BMI 32.78 kg/m2 31.99 kg/m2 31.65 kg/m2    Current exercise per week 40 minutes.

## 2015-05-06 NOTE — Assessment & Plan Note (Signed)
Light headed, hyperlipidemia and bruit, refer for carotid US

## 2015-05-09 ENCOUNTER — Ambulatory Visit (HOSPITAL_COMMUNITY)
Admission: RE | Admit: 2015-05-09 | Discharge: 2015-05-09 | Disposition: A | Discharge status: Home / Self Care | Payer: 59 | Source: Ambulatory Visit | Attending: Family Medicine | Admitting: Family Medicine

## 2015-05-09 DIAGNOSIS — R0989 Other specified symptoms and signs involving the circulatory and respiratory systems: Secondary | ICD-10-CM | POA: Insufficient documentation

## 2015-05-09 DIAGNOSIS — I10 Essential (primary) hypertension: Secondary | ICD-10-CM | POA: Insufficient documentation

## 2015-05-09 DIAGNOSIS — E785 Hyperlipidemia, unspecified: Secondary | ICD-10-CM | POA: Diagnosis not present

## 2015-05-09 DIAGNOSIS — R55 Syncope and collapse: Secondary | ICD-10-CM | POA: Insufficient documentation

## 2015-05-09 DIAGNOSIS — R42 Dizziness and giddiness: Secondary | ICD-10-CM

## 2015-05-10 ENCOUNTER — Other Ambulatory Visit: Payer: Self-pay | Admitting: Family Medicine

## 2015-05-14 ENCOUNTER — Other Ambulatory Visit: Payer: Self-pay

## 2015-05-14 MED ORDER — GLUCOSE BLOOD VI STRP: ORAL_STRIP | Status: DC

## 2015-05-14 MED ORDER — ONETOUCH DELICA LANCETS 33G MISC: Status: DC

## 2015-05-14 MED ORDER — ONETOUCH VERIO IQ SYSTEM W/DEVICE KIT: PACK | Status: DC

## 2015-05-17 ENCOUNTER — Other Ambulatory Visit: Payer: Self-pay

## 2015-05-17 MED ORDER — HYDROCODONE-ACETAMINOPHEN 5-325 MG PO TABS: ORAL_TABLET | ORAL | Status: DC

## 2015-05-22 ENCOUNTER — Other Ambulatory Visit: Payer: Self-pay | Admitting: Family Medicine

## 2015-05-25 ENCOUNTER — Other Ambulatory Visit: Payer: Self-pay | Admitting: Family Medicine

## 2015-05-28 ENCOUNTER — Telehealth: Payer: Self-pay | Admitting: Family Medicine

## 2015-05-28 ENCOUNTER — Other Ambulatory Visit: Payer: Self-pay

## 2015-05-28 MED ORDER — INSULIN DETEMIR 100 UNIT/ML FLEXPEN: 60.0000 [IU] | PEN_INJECTOR | Freq: Every day | SUBCUTANEOUS | Status: DC

## 2015-05-28 NOTE — Telephone Encounter (Signed)
Med sent to [facility] as requested  

## 2015-05-28 NOTE — Telephone Encounter (Signed)
Patient needs a 1 time box of levamir until hers comes by mail to [facility] in [place],

## 2015-06-18 ENCOUNTER — Ambulatory Visit (INDEPENDENT_AMBULATORY_CARE_PROVIDER_SITE_OTHER): Payer: 59 | Admitting: Urology

## 2015-06-18 DIAGNOSIS — N281 Cyst of kidney, acquired: Secondary | ICD-10-CM

## 2015-06-27 ENCOUNTER — Other Ambulatory Visit: Payer: Self-pay | Admitting: Family Medicine

## 2015-06-28 ENCOUNTER — Other Ambulatory Visit: Payer: Self-pay | Admitting: Family Medicine

## 2015-07-02 ENCOUNTER — Telehealth: Payer: Self-pay | Admitting: Family Medicine

## 2015-07-02 ENCOUNTER — Emergency Department (HOSPITAL_COMMUNITY)
Admission: EM | Admit: 2015-07-02 | Discharge: 2015-07-02 | Disposition: A | Discharge status: Home / Self Care | Payer: 59 | Attending: Emergency Medicine | Admitting: Emergency Medicine

## 2015-07-02 ENCOUNTER — Encounter (HOSPITAL_COMMUNITY): Payer: Self-pay | Admitting: Emergency Medicine

## 2015-07-02 ENCOUNTER — Emergency Department (HOSPITAL_COMMUNITY): Payer: 59

## 2015-07-02 DIAGNOSIS — Z79891 Long term (current) use of opiate analgesic: Secondary | ICD-10-CM | POA: Diagnosis not present

## 2015-07-02 DIAGNOSIS — E119 Type 2 diabetes mellitus without complications: Secondary | ICD-10-CM | POA: Insufficient documentation

## 2015-07-02 DIAGNOSIS — R531 Weakness: Secondary | ICD-10-CM | POA: Diagnosis present

## 2015-07-02 DIAGNOSIS — J111 Influenza due to unidentified influenza virus with other respiratory manifestations: Secondary | ICD-10-CM | POA: Diagnosis not present

## 2015-07-02 DIAGNOSIS — Z794 Long term (current) use of insulin: Secondary | ICD-10-CM | POA: Insufficient documentation

## 2015-07-02 DIAGNOSIS — Z79899 Other long term (current) drug therapy: Secondary | ICD-10-CM | POA: Diagnosis not present

## 2015-07-02 DIAGNOSIS — E782 Mixed hyperlipidemia: Secondary | ICD-10-CM | POA: Insufficient documentation

## 2015-07-02 DIAGNOSIS — I10 Essential (primary) hypertension: Secondary | ICD-10-CM | POA: Diagnosis not present

## 2015-07-02 DIAGNOSIS — R6889 Other general symptoms and signs: Secondary | ICD-10-CM

## 2015-07-02 DIAGNOSIS — E669 Obesity, unspecified: Secondary | ICD-10-CM | POA: Insufficient documentation

## 2015-07-02 MED ORDER — GUAIFENESIN-CODEINE 100-10 MG/5ML PO SOLN: 10.0000 mL | Freq: Once | ORAL | Status: DC

## 2015-07-02 MED ORDER — AZITHROMYCIN 250 MG PO TABS: 250.0000 mg | ORAL_TABLET | Freq: Every day | ORAL | Status: DC

## 2015-07-02 MED ORDER — GUAIFENESIN-CODEINE 100-10 MG/5ML PO SOLN
10.0000 mL | Freq: Once | ORAL | Status: AC
  Administered 2015-07-02: 10 mL via ORAL
  Filled 2015-07-02: qty 10

## 2015-07-02 NOTE — Telephone Encounter (Signed)
pls give work in appt tomorrow am

## 2015-07-02 NOTE — Telephone Encounter (Signed)
Patient is complaining of cough, congestion, coughing so much that her whole body is sore, shore throat, fever, since Wednesday she is asking for a antibiotic

## 2015-07-02 NOTE — Telephone Encounter (Signed)
Please advise 

## 2015-07-02 NOTE — ED Notes (Signed)
Having cold symptoms since last Wednesday.  Weakness, bodyache, cough (non-productive), earaches, and fever.

## 2015-07-03 NOTE — Telephone Encounter (Signed)
Noted,  Good to know

## 2015-07-03 NOTE — Telephone Encounter (Signed)
Patient called back this morning and stated she ended up in the ER and stated she had the flu she has the Rx for a Z-pak if no better by Friday and also was given cough medication. They took a chest x-ray and it was normal

## 2015-07-04 ENCOUNTER — Encounter: Payer: Self-pay | Admitting: Family Medicine

## 2015-07-04 ENCOUNTER — Ambulatory Visit: Payer: 59 | Admitting: Family Medicine

## 2015-07-10 NOTE — ED Provider Notes (Signed)
CSN: [id number]     Arrival date & time 07/02/15  1509 History   First MD Initiated Contact with Patient 07/02/15 1533     Chief Complaint  Patient presents with  . Generalized Body Aches     (Consider location/radiation/quality/duration/timing/severity/associated sxs/prior Treatment) HPI   60yf with generalized weakness. Onset Wednesday last week. Persistent since then. Body aches. Nonproductive cough. "scratchy" throat. Subjective fever. No rash. No urinary complaints. No sick contacts.   Past Medical History  Diagnosis Date  . Type 2 diabetes mellitus, uncontrolled ([place])   . Essential hypertension   . Back pain   . Anemia   . Obesity   . Axillary masses     Soft tissue - status post excision  . Sleep apnea     Noncompliant with CPAP  . Mixed hyperlipidemia    Past Surgical History  Procedure Laterality Date  . Abdominal hysterectomy    . Lung biopsy    . Foot surgery Bilateral   . Mass excision Right 01/09/2013    Procedure: EXCISION OF NEOPLASM OF RIGHT  AXILLA  AND EXCISION OF NEOPLASM OF LEFT AXILLA;  Surgeon: Jamesetta So, MD;  Location: [facility];  Service: General;  Laterality: Right;  procedure end @ 08:23   History reviewed. No pertinent family history. Social History  Substance Use Topics  . Smoking status: Never Smoker   . Smokeless tobacco: None  . Alcohol Use: No   OB History    No data available     Review of Systems  All systems reviewed and negative, other than as noted in HPI.   Allergies  Ace inhibitors; Penicillins; and Statins  Home Medications   Prior to Admission medications   Medication Sig Start Date End Date Taking? Authorizing Provider  allopurinol (ZYLOPRIM) 300 MG tablet TAKE 1 TABLET (300 MG TOTAL) BY MOUTH DAILY. 05/13/15  Yes Fayrene Helper, MD  amLODipine (NORVASC) 10 MG tablet Take 1 tablet by mouth  daily 05/27/15  Yes Fayrene Helper, MD  cloNIDine (CATAPRES) 0.1 MG tablet Take 1 tablet by mouth  every evening at 9pm  for  blood pressure 05/27/15  Yes Fayrene Helper, MD  cyclobenzaprine (FLEXERIL) 10 MG tablet Take 1 tablet by mouth  every night at bedtime as  needed for muscle spasm(s) 04/01/15  Yes Fayrene Helper, MD  ergocalciferol (VITAMIN D2) 50000 UNITS capsule Take 1 capsule (50,000 Units total) by mouth once a week. One capsule once weekly 10/02/14  Yes Fayrene Helper, MD  fluticasone [facility]) 50 MCG/ACT nasal spray Place 2 sprays into both nostrils daily. 07/31/13  Yes Fayrene Helper, MD  gabapentin (NEURONTIN) 100 MG capsule Take 1 capsule (100 mg total) by mouth 3 (three) times daily. 03/12/15  Yes Fayrene Helper, MD  HYDROcodone-acetaminophen (NORCO/VICODIN) 5-325 MG tablet One tablet at bedtime, as needed, for uncontrolled back pain Thirty to last 3 months Patient taking differently: Take 1 tablet by mouth at bedtime.  05/17/15  Yes Fayrene Helper, MD  insulin detemir (LEVEMIR) 100 UNIT/ML injection Inject 60 Units into the skin at bedtime.  01/13/11  Yes Fayrene Helper, MD  insulin lispro (HUMALOG) 100 UNIT/ML injection Inject 10-25 Units into the skin See admin instructions. Twice daily as needed before meals. Patient uses according to sliding scale at home   Yes Historical Provider, MD  INVOKANA 100 MG TABS tablet TAKE 1 TABLET EVERY DAY 04/08/15  Yes Fayrene Helper, MD  LEVEMIR FLEXTOUCH 100 UNIT/ML Pen  INJECT 60 UNITS INTO THE SKIN DAILY AT 10 PM. 06/28/15  Yes Fayrene Helper, MD  loratadine (CLARITIN) 10 MG tablet Take 1 tablet (10 mg total) by mouth daily as needed for allergies. 03/09/13 07/02/15 Yes Fayrene Helper, MD  montelukast (SINGULAIR) 10 MG tablet TAKE 1 TABLET (10 MG TOTAL) BY MOUTH AT BEDTIME. 06/28/15  Yes Fayrene Helper, MD  omeprazole (PRILOSEC) 20 MG capsule Take 1 capsule by mouth  daily 01/28/15  Yes Fayrene Helper, MD  spironolactone (ALDACTONE) 25 MG tablet Take 1 tablet by mouth two  times daily 05/23/15  Yes Fayrene Helper, MD   triamterene-hydrochlorothiazide [facility]) 75-50 MG tablet Take 1 tablet by mouth  daily 05/27/15  Yes Fayrene Helper, MD  valsartan (DIOVAN) 320 MG tablet Take 1 tablet by mouth  daily 01/28/15  Yes Fayrene Helper, MD  azithromycin (ZITHROMAX) 250 MG tablet Take 1 tablet (250 mg total) by mouth daily. Take first 2 tablets together, then 1 every day until finished. 07/02/15   Virgel Manifold, MD  Blood Glucose Monitoring Suppl [facility] VERIO IQ SYSTEM) w/Device KIT Three times daily testing dx E11.65 05/14/15   Fayrene Helper, MD  glucose blood test strip One touch verio strips, three times daily testing 05/14/15   Fayrene Helper, MD  guaiFENesin-codeine 100-10 MG/5ML syrup Take 10 mLs by mouth once. 07/02/15   Virgel Manifold, MD  [facility] DELICA LANCETS 10C MISC Three times daily testing dx E11.65 05/14/15   Fayrene Helper, MD   BP 137/84 mmHg  Pulse 69  Temp(Src) 98.2 F (36.8 C) (Oral)  Resp 17  Ht 5' 6"  (1.676 m)  Wt 198 lb (89.812 kg)  BMI 31.97 kg/m2  SpO2 96% Physical Exam  Constitutional: She appears well-developed and well-nourished. No distress.  HENT:  Head: Normocephalic and atraumatic.  Eyes: Conjunctivae are normal. Right eye exhibits no discharge. Left eye exhibits no discharge.  Neck: Neck supple.  Cardiovascular: Normal rate, regular rhythm and normal heart sounds.  Exam reveals no gallop and no friction rub.   No murmur heard. Pulmonary/Chest: Effort normal and breath sounds normal. No respiratory distress.  Abdominal: Soft. She exhibits no distension. There is no tenderness.  Musculoskeletal: She exhibits no edema or tenderness.  Lower extremities symmetric as compared to each other. No calf tenderness. Negative Homan's. No palpable cords.   Neurological: She is alert.  Skin: Skin is warm and dry.  Psychiatric: She has a normal mood and affect. Her behavior is normal. Thought content normal.  Nursing note and vitals reviewed.   ED Course  Procedures  (including critical care time) Labs Review Labs Reviewed - No data to display  Imaging Review No results found. I have personally reviewed and evaluated these images and lab results as part of my medical decision-making.   EKG Interpretation None      MDM   Final diagnoses:  Flu-like symptoms    60yF with flu like symptoms. Suspect viral illness. Pt pressing for abx. Ultimately gave script for azithromycin. Urged her not to fill though unless symptoms did not eging to improve in 2-3 days. It has been determined that no acute conditions requiring further emergency intervention are present at this time. The patient has been advised of the diagnosis and plan. I reviewed any labs and imaging including any potential incidental findings. We have discussed signs and symptoms that warrant return to the ED and they are listed in the discharge instructions.  Virgel Manifold, MD 07/10/15 [phone]

## 2015-07-24 LAB — COMPLETE METABOLIC PANEL WITH GFR
ALBUMIN: 3.6 g/dL (ref 3.6–5.1)
ALK PHOS: 84 U/L (ref 33–130)
ALT: 18 U/L (ref 6–29)
AST: 19 U/L (ref 10–35)
BILIRUBIN TOTAL: 0.2 mg/dL (ref 0.2–1.2)
BUN: 19 mg/dL (ref 7–25)
CALCIUM: 9.4 mg/dL (ref 8.6–10.4)
CO2: 26 mmol/L (ref 20–31)
Chloride: 103 mmol/L (ref 98–110)
Creat: 1.37 mg/dL — ABNORMAL HIGH (ref 0.50–1.05)
GFR, EST AFRICAN AMERICAN: 49 mL/min — AB (ref >=60)
GFR, EST NON AFRICAN AMERICAN: 42 mL/min — AB (ref >=60)
GLUCOSE: 138 mg/dL — AB (ref 65–99)
POTASSIUM: 4.2 mmol/L (ref 3.5–5.3)
Sodium: 139 mmol/L (ref 135–146)
TOTAL PROTEIN: 6.6 g/dL (ref 6.1–8.1)

## 2015-07-24 LAB — LIPID PANEL
CHOL/HDL RATIO: 5.6 ratio — AB (ref <=5.0)
CHOLESTEROL: 230 mg/dL — AB (ref 125–200)
HDL: 41 mg/dL — AB (ref >=46)
LDL Cholesterol: 109 mg/dL (ref <130)
TRIGLYCERIDES: 398 mg/dL — AB (ref <150)
VLDL: 80 mg/dL — ABNORMAL HIGH (ref <30)

## 2015-07-24 LAB — URIC ACID: URIC ACID, SERUM: 7.1 mg/dL — AB (ref 2.4–7.0)

## 2015-07-25 ENCOUNTER — Ambulatory Visit (INDEPENDENT_AMBULATORY_CARE_PROVIDER_SITE_OTHER): Payer: 59 | Admitting: Family Medicine

## 2015-07-25 ENCOUNTER — Encounter: Payer: Self-pay | Admitting: Family Medicine

## 2015-07-25 ENCOUNTER — Other Ambulatory Visit: Payer: Self-pay | Admitting: Family Medicine

## 2015-07-25 VITALS — BP 130/82 | HR 84 | Resp 18 | Ht 66.0 in | Wt 201.0 lb

## 2015-07-25 DIAGNOSIS — J302 Other seasonal allergic rhinitis: Secondary | ICD-10-CM

## 2015-07-25 DIAGNOSIS — E669 Obesity, unspecified: Secondary | ICD-10-CM

## 2015-07-25 DIAGNOSIS — Z23 Encounter for immunization: Secondary | ICD-10-CM | POA: Diagnosis not present

## 2015-07-25 DIAGNOSIS — IMO0002 Reserved for concepts with insufficient information to code with codable children: Secondary | ICD-10-CM

## 2015-07-25 DIAGNOSIS — E1065 Type 1 diabetes mellitus with hyperglycemia: Secondary | ICD-10-CM

## 2015-07-25 DIAGNOSIS — K219 Gastro-esophageal reflux disease without esophagitis: Secondary | ICD-10-CM

## 2015-07-25 DIAGNOSIS — I10 Essential (primary) hypertension: Secondary | ICD-10-CM

## 2015-07-25 DIAGNOSIS — E782 Mixed hyperlipidemia: Secondary | ICD-10-CM | POA: Diagnosis not present

## 2015-07-25 DIAGNOSIS — R490 Dysphonia: Secondary | ICD-10-CM

## 2015-07-25 DIAGNOSIS — E1069 Type 1 diabetes mellitus with other specified complication: Secondary | ICD-10-CM | POA: Diagnosis not present

## 2015-07-25 DIAGNOSIS — E790 Hyperuricemia without signs of inflammatory arthritis and tophaceous disease: Secondary | ICD-10-CM

## 2015-07-25 MED ORDER — RANITIDINE HCL 150 MG PO TABS: 150.0000 mg | ORAL_TABLET | Freq: Two times a day (BID) | ORAL | Status: DC

## 2015-07-25 MED ORDER — AZELASTINE HCL 0.1 % NA SOLN: 2.0000 | Freq: Two times a day (BID) | NASAL | Status: DC

## 2015-07-25 NOTE — Patient Instructions (Signed)
Annual physical exam in 4 month, call if you need me sooner  HBa1C today, we will call and discuss results with you Pneumonia 23 today  Change eating habits as discussed  Astelin sent in for daily use for allergies to mail order, Zantac twice daily as additional medication for reflux sent  Call to see ENT if hoarseness persists in next 6 to 8 weeks  Pls ask to have eye exam sent once done next month  Fasting labs for next visit

## 2015-07-25 NOTE — Progress Notes (Signed)
Subjective:    Patient ID: Kathryn Beck, female    DOB: 1955-05-04, 60 y.o.   MRN: [id number]  HPI   Kathryn Beck     MRN: [id number]      DOB: [birth year]   HPI Kathryn Beck is here for follow up and re-evaluation of chronic medical conditions, medication management and review of any available recent lab and radiology data.  Preventive health is updated, specifically  Cancer screening and Immunization.   Still not being treated for her uncontrolled diabetes  By endo, states ins will not cover. The PT denies any adverse reactions to current medications since the last visit.  Treated for viral URI in ED  Last month, still has increased nasal congestion and clear drainge, also excess sneezing and watery eyes, no fever , chills or productive  Cough C/o chronic hoarseness Not testing blood sugar, states has no supplies! Denies polyuria, polydipsia, blurred vision , or hypoglycemic episodes.    ROS Denies recent fever or chills. Denies sinus pressure,  ear pain or sore throat. Denies chest congestion, productive cough or wheezing. Denies chest pains, palpitations and leg swelling Denies abdominal pain, nausea, vomiting,diarrhea or constipation.   Denies dysuria, frequency, hesitancy or incontinence. Denies joint pain, swelling and limitation in mobility. Denies headaches, seizures, numbness, or tingling. Denies depression, anxiety or insomnia. Denies skin break down or rash.   PE  BP 130/82 mmHg  Pulse 84  Resp 18  Ht 5\' 6"  (1.676 m)  Wt 201 lb (91.173 kg)  BMI 32.46 kg/m2  SpO2 96%  Patient alert and oriented and in no cardiopulmonary distress.  HEENT: No facial asymmetry, EOMI,   oropharynx pink and moist.  Neck supple no JVD, no mass. No sinus tenderness, TM clear bilaterally. Erythema and edema of nasal mucosa Chest: Clear to auscultation bilaterally.  CVS: S1, S2 no murmurs, no S3.Regular rate.  ABD: Soft non tender.   Ext: No edema  MS: Adequate ROM  spine, shoulders, hips and knees.  Skin: Intact, no ulcerations or rash noted.  Psych: Good eye contact, normal affect. Memory intact not anxious or depressed appearing.  CNS: CN 2-12 intact, power,  normal throughout.no focal deficits noted.   Assessment & Plan  Essential hypertension Controlled, no change in medication DASH diet and commitment to daily physical activity for a minimum of 30 minutes discussed and encouraged, as a part of hypertension management. The importance of attaining a healthy weight is also discussed.  BP/Weight 07/25/2015 07/02/2015 04/25/2015 03/12/2015 10/02/2014 04/02/2014 Q000111Q  Systolic BP AB-123456789 0000000 A999333 123456 Q000111Q 123456 0000000  Diastolic BP 82 84 80 72 78 72 80  Wt. (Lbs) 201 198 203 198.08 196 200 202.8  BMI 32.46 31.97 32.78 31.99 31.65 32.3 32.75        Seasonal allergies Uncontrolled add Astelin  GERD (gastroesophageal reflux disease) Uncontrolled with symptoms of heartburn and chronic hoarseness. Advised to reduce caffeine, work on weight loss , elevate HOB and zantac added She is to call back in 6 weeks if hoarseness persists for ENT referral  Diabetes mellitus, insulin dependent (IDDM), uncontrolled Uncontrolled. Medication adjusted after visit when updated lab available. Needs to return to endo, gives financial reasons as barrier, states that her ins is not covering, she will follow up further, needs endo Kathryn Beck is reminded of the importance of commitment to daily physical activity for 30 minutes or more, as able and the need to limit carbohydrate intake to 30 to 60 grams per meal to help  with blood sugar control.   The need to take medication as prescribed, test blood sugar as directed, and to call between visits if there is a concern that blood sugar is uncontrolled is also discussed.   Kathryn Beck is reminded of the importance of daily foot exam, annual eye examination, and good blood sugar, blood pressure and cholesterol  control.  Diabetic Labs Latest Ref Rng 07/25/2015 07/23/2015 04/25/2015 03/12/2015 10/02/2014  HbA1c <5.7 % 10.3(H) - - 10.6(H) 9.7(H)  Microalbumin <2.0 mg/dL - - - - 92.2(H)  Micro/Creat Ratio 0.0 - 30.0 mg/g - - - - 963.4(H)  Chol 125 - 200 mg/dL - 230(H) - 245(H) -  HDL >=46 mg/dL - 41(L) - 38(L) -  Calc LDL <130 mg/dL - 109 - 134(H) -  Triglycerides <150 mg/dL - 398(H) - 366(H) -  Creatinine 0.50 - 1.05 mg/dL - 1.37(H) 1.97(H) - 1.60(H)   BP/Weight 07/25/2015 07/02/2015 04/25/2015 03/12/2015 10/02/2014 04/02/2014 Q000111Q  Systolic BP AB-123456789 0000000 A999333 123456 Q000111Q 123456 0000000  Diastolic BP 82 84 80 72 78 72 80  Wt. (Lbs) 201 198 203 198.08 196 200 202.8  BMI 32.46 31.97 32.78 31.99 31.65 32.3 32.75   Foot/eye exam completion dates Latest Ref Rng 03/12/2015 04/27/2014  Eye Exam No Retinopathy - No Retinopathy  Foot Form Completion - Done -         Mixed hyperlipidemia Hyperlipidemia:Low fat diet discussed and encouraged.   Lipid Panel  Lab Results  Component Value Date   CHOL 230* 07/23/2015   HDL 41* 07/23/2015   LDLCALC 109 07/23/2015   LDLDIRECT 90 09/26/2014   TRIG 398* 07/23/2015   CHOLHDL 5.6* 07/23/2015   Not at goal, but improved, would benefit from zetia unable to get coverage for the drug, unable to prescribe statin   Chronic hoarseness Likely due to inadequately treated GERD, will call back in 6 weeks if med management fails to correct this , for ENT referral  Obesity Deteriorated. Patient re-educated about  the importance of commitment to a  minimum of 150 minutes of exercise per week.  The importance of healthy food choices with portion control discussed. Encouraged to start a food diary, count calories and to consider  joining a support group. Sample diet sheets offered. Goals set by the patient for the next several months.   Weight /BMI 07/25/2015 07/02/2015 04/25/2015  WEIGHT 201 lb 198 lb 203 lb  HEIGHT 5\' 6"  5\' 6"  5\' 6"   BMI 32.46 kg/m2 31.97 kg/m2 32.78 kg/m2     Current exercise per week 90 minutes.        Review of Systems     Objective:   Physical Exam        Assessment & Plan:

## 2015-07-26 LAB — HEMOGLOBIN A1C
Hgb A1c MFr Bld: 10.3 % — ABNORMAL HIGH (ref <5.7)
MEAN PLASMA GLUCOSE: 249 mg/dL

## 2015-07-31 MED ORDER — INSULIN DETEMIR 100 UNIT/ML ~~LOC~~ SOLN: 90.0000 [IU] | Freq: Every day | SUBCUTANEOUS | Status: DC

## 2015-07-31 MED ORDER — INSULIN DETEMIR 100 UNIT/ML FLEXPEN: PEN_INJECTOR | SUBCUTANEOUS | Status: DC

## 2015-08-03 DIAGNOSIS — R490 Dysphonia: Secondary | ICD-10-CM | POA: Insufficient documentation

## 2015-08-03 NOTE — Assessment & Plan Note (Signed)
Uncontrolled add Astelin

## 2015-08-03 NOTE — Assessment & Plan Note (Signed)
Uncontrolled with symptoms of heartburn and chronic hoarseness. Advised to reduce caffeine, work on weight loss , elevate HOB and zantac added She is to call back in 6 weeks if hoarseness persists for ENT referral

## 2015-08-03 NOTE — Assessment & Plan Note (Signed)
Likely due to inadequately treated GERD, will call back in 6 weeks if med management fails to correct this , for ENT referral

## 2015-08-03 NOTE — Assessment & Plan Note (Signed)
Uncontrolled. Medication adjusted after visit when updated lab available. Needs to return to endo, gives financial reasons as barrier, states that her ins is not covering, she will follow up further, needs endo Ms. Evjen is reminded of the importance of commitment to daily physical activity for 30 minutes or more, as able and the need to limit carbohydrate intake to 30 to 60 grams per meal to help with blood sugar control.   The need to take medication as prescribed, test blood sugar as directed, and to call between visits if there is a concern that blood sugar is uncontrolled is also discussed.   Ms. Ralph is reminded of the importance of daily foot exam, annual eye examination, and good blood sugar, blood pressure and cholesterol control.  Diabetic Labs Latest Ref Rng 07/25/2015 07/23/2015 04/25/2015 03/12/2015 10/02/2014  HbA1c <5.7 % 10.3(H) - - 10.6(H) 9.7(H)  Microalbumin <2.0 mg/dL - - - - 92.2(H)  Micro/Creat Ratio 0.0 - 30.0 mg/g - - - - 963.4(H)  Chol 125 - 200 mg/dL - 230(H) - 245(H) -  HDL >=46 mg/dL - 41(L) - 38(L) -  Calc LDL <130 mg/dL - 109 - 134(H) -  Triglycerides <150 mg/dL - 398(H) - 366(H) -  Creatinine 0.50 - 1.05 mg/dL - 1.37(H) 1.97(H) - 1.60(H)   BP/Weight 07/25/2015 07/02/2015 04/25/2015 03/12/2015 10/02/2014 04/02/2014 Q000111Q  Systolic BP AB-123456789 0000000 A999333 123456 Q000111Q 123456 0000000  Diastolic BP 82 84 80 72 78 72 80  Wt. (Lbs) 201 198 203 198.08 196 200 202.8  BMI 32.46 31.97 32.78 31.99 31.65 32.3 32.75   Foot/eye exam completion dates Latest Ref Rng 03/12/2015 04/27/2014  Eye Exam No Retinopathy - No Retinopathy  Foot Form Completion - Done -

## 2015-08-03 NOTE — Assessment & Plan Note (Signed)
Controlled, no change in medication DASH diet and commitment to daily physical activity for a minimum of 30 minutes discussed and encouraged, as a part of hypertension management. The importance of attaining a healthy weight is also discussed.  BP/Weight 07/25/2015 07/02/2015 04/25/2015 03/12/2015 10/02/2014 04/02/2014 Q000111Q  Systolic BP AB-123456789 0000000 A999333 123456 Q000111Q 123456 0000000  Diastolic BP 82 84 80 72 78 72 80  Wt. (Lbs) 201 198 203 198.08 196 200 202.8  BMI 32.46 31.97 32.78 31.99 31.65 32.3 32.75

## 2015-08-03 NOTE — Assessment & Plan Note (Signed)
Hyperlipidemia:Low fat diet discussed and encouraged.   Lipid Panel  Lab Results  Component Value Date   CHOL 230* 07/23/2015   HDL 41* 07/23/2015   LDLCALC 109 07/23/2015   LDLDIRECT 90 09/26/2014   TRIG 398* 07/23/2015   CHOLHDL 5.6* 07/23/2015   Not at goal, but improved, would benefit from zetia unable to get coverage for the drug, unable to prescribe statin

## 2015-08-03 NOTE — Assessment & Plan Note (Signed)
Deteriorated. Patient re-educated about  the importance of commitment to a  minimum of 150 minutes of exercise per week.  The importance of healthy food choices with portion control discussed. Encouraged to start a food diary, count calories and to consider  joining a support group. Sample diet sheets offered. Goals set by the patient for the next several months.   Weight /BMI 07/25/2015 07/02/2015 04/25/2015  WEIGHT 201 lb 198 lb 203 lb  HEIGHT 5\' 6"  5\' 6"  5\' 6"   BMI 32.46 kg/m2 31.97 kg/m2 32.78 kg/m2    Current exercise per week 90 minutes.

## 2015-08-12 ENCOUNTER — Other Ambulatory Visit: Payer: Self-pay | Admitting: Family Medicine

## 2015-08-13 ENCOUNTER — Other Ambulatory Visit: Payer: Self-pay | Admitting: Family Medicine

## 2015-08-13 DIAGNOSIS — Z1231 Encounter for screening mammogram for malignant neoplasm of breast: Secondary | ICD-10-CM

## 2015-08-20 ENCOUNTER — Other Ambulatory Visit: Payer: Self-pay

## 2015-08-20 MED ORDER — INSULIN PEN NEEDLE 31G X 5 MM MISC: Status: DC

## 2015-08-20 MED ORDER — ACCU-CHEK FASTCLIX LANCETS MISC: Status: DC

## 2015-08-29 ENCOUNTER — Ambulatory Visit (HOSPITAL_COMMUNITY)
Admission: RE | Admit: 2015-08-29 | Discharge: 2015-08-29 | Disposition: A | Discharge status: Home / Self Care | Payer: 59 | Source: Ambulatory Visit | Attending: Family Medicine | Admitting: Family Medicine

## 2015-08-29 DIAGNOSIS — Z1231 Encounter for screening mammogram for malignant neoplasm of breast: Secondary | ICD-10-CM | POA: Insufficient documentation

## 2015-08-30 ENCOUNTER — Other Ambulatory Visit: Payer: Self-pay

## 2015-08-30 MED ORDER — INSULIN DETEMIR 100 UNIT/ML FLEXPEN: PEN_INJECTOR | SUBCUTANEOUS | Status: DC

## 2015-10-01 ENCOUNTER — Other Ambulatory Visit: Payer: Self-pay | Admitting: "Endocrinology

## 2015-10-01 DIAGNOSIS — E785 Hyperlipidemia, unspecified: Secondary | ICD-10-CM

## 2015-10-01 DIAGNOSIS — E1165 Type 2 diabetes mellitus with hyperglycemia: Secondary | ICD-10-CM

## 2015-10-01 DIAGNOSIS — E559 Vitamin D deficiency, unspecified: Secondary | ICD-10-CM

## 2015-10-01 DIAGNOSIS — E1169 Type 2 diabetes mellitus with other specified complication: Principal | ICD-10-CM

## 2015-10-01 DIAGNOSIS — IMO0002 Reserved for concepts with insufficient information to code with codable children: Secondary | ICD-10-CM

## 2015-10-05 ENCOUNTER — Other Ambulatory Visit: Payer: Self-pay | Admitting: Family Medicine

## 2015-10-07 ENCOUNTER — Telehealth: Payer: Self-pay

## 2015-10-07 DIAGNOSIS — E790 Hyperuricemia without signs of inflammatory arthritis and tophaceous disease: Secondary | ICD-10-CM

## 2015-10-07 LAB — HEMOGLOBIN A1C
Hgb A1c MFr Bld: 10.2 % — ABNORMAL HIGH (ref <5.7)
MEAN PLASMA GLUCOSE: 246 mg/dL

## 2015-10-07 LAB — URIC ACID: Uric Acid, Serum: 9.3 mg/dL — ABNORMAL HIGH (ref 2.5–7.0)

## 2015-10-07 NOTE — Telephone Encounter (Signed)
Labs ordered.

## 2015-10-08 LAB — VITAMIN D 25 HYDROXY (VIT D DEFICIENCY, FRACTURES): VIT D 25 HYDROXY: 15 ng/mL — AB (ref 30–100)

## 2015-10-08 LAB — BASIC METABOLIC PANEL
BUN: 39 mg/dL — ABNORMAL HIGH (ref 7–25)
CALCIUM: 10.3 mg/dL (ref 8.6–10.4)
CO2: 24 mmol/L (ref 20–31)
CREATININE: 2.14 mg/dL — AB (ref 0.50–1.05)
Chloride: 101 mmol/L (ref 98–110)
Glucose, Bld: 95 mg/dL (ref 65–99)
Potassium: 5.2 mmol/L (ref 3.5–5.3)
Sodium: 137 mmol/L (ref 135–146)

## 2015-10-08 LAB — LIPID PANEL
CHOLESTEROL: 304 mg/dL — AB (ref 125–200)
HDL: 54 mg/dL (ref >=46)
TRIGLYCERIDES: 711 mg/dL — AB (ref <150)
Total CHOL/HDL Ratio: 5.6 Ratio — ABNORMAL HIGH (ref <=5.0)

## 2015-10-09 ENCOUNTER — Other Ambulatory Visit (HOSPITAL_COMMUNITY)
Admission: RE | Admit: 2015-10-09 | Discharge: 2015-10-09 | Disposition: A | Discharge status: Home / Self Care | Payer: 59 | Source: Ambulatory Visit | Attending: Family Medicine | Admitting: Family Medicine

## 2015-10-09 ENCOUNTER — Telehealth: Payer: Self-pay | Admitting: Family Medicine

## 2015-10-09 ENCOUNTER — Encounter: Payer: Self-pay | Admitting: Family Medicine

## 2015-10-09 ENCOUNTER — Ambulatory Visit (INDEPENDENT_AMBULATORY_CARE_PROVIDER_SITE_OTHER): Payer: 59 | Admitting: Family Medicine

## 2015-10-09 VITALS — BP 142/86 | HR 89 | Resp 16 | Ht 66.0 in | Wt 199.8 lb

## 2015-10-09 DIAGNOSIS — K219 Gastro-esophageal reflux disease without esophagitis: Secondary | ICD-10-CM

## 2015-10-09 DIAGNOSIS — R290 Tetany: Secondary | ICD-10-CM

## 2015-10-09 DIAGNOSIS — E782 Mixed hyperlipidemia: Secondary | ICD-10-CM | POA: Diagnosis not present

## 2015-10-09 DIAGNOSIS — IMO0002 Reserved for concepts with insufficient information to code with codable children: Secondary | ICD-10-CM

## 2015-10-09 DIAGNOSIS — E1065 Type 1 diabetes mellitus with hyperglycemia: Secondary | ICD-10-CM

## 2015-10-09 DIAGNOSIS — E1069 Type 1 diabetes mellitus with other specified complication: Secondary | ICD-10-CM | POA: Insufficient documentation

## 2015-10-09 DIAGNOSIS — I10 Essential (primary) hypertension: Secondary | ICD-10-CM

## 2015-10-09 DIAGNOSIS — N183 Chronic kidney disease, stage 3 unspecified: Secondary | ICD-10-CM

## 2015-10-09 DIAGNOSIS — E790 Hyperuricemia without signs of inflammatory arthritis and tophaceous disease: Secondary | ICD-10-CM

## 2015-10-09 DIAGNOSIS — R252 Cramp and spasm: Secondary | ICD-10-CM

## 2015-10-09 DIAGNOSIS — R6889 Other general symptoms and signs: Secondary | ICD-10-CM

## 2015-10-09 NOTE — Assessment & Plan Note (Signed)
Uncontrolled, c/o abd pain when she eats, wants to holsd off on GI eval will call beack in next 2 to 3 weeks if persists Will increase omeprazole to 40 mg for next 2 months Will need to call her about this

## 2015-10-09 NOTE — Assessment & Plan Note (Signed)
Uncontrolled, treated by endo Kathryn Beck is reminded of the importance of commitment to daily physical activity for 30 minutes or more, as able and the need to limit carbohydrate intake to 30 to 60 grams per meal to help with blood sugar control.   The need to take medication as prescribed, test blood sugar as directed, and to call between visits if there is a concern that blood sugar is uncontrolled is also discussed.   Kathryn Beck is reminded of the importance of daily foot exam, annual eye examination, and good blood sugar, blood pressure and cholesterol control.  Diabetic Labs Latest Ref Rng 10/07/2015 07/25/2015 07/23/2015 04/25/2015 03/12/2015  HbA1c <5.7 % 10.2(H) 10.3(H) - - 10.6(H)  Microalbumin <2.0 mg/dL - - - - -  Micro/Creat Ratio 0.0 - 30.0 mg/g - - - - -  Chol 125 - 200 mg/dL 304(H) - 230(H) - 245(H)  HDL >=46 mg/dL 54 - 41(L) - 38(L)  Calc LDL <130 mg/dL NOT CALC - 109 - 134(H)  Triglycerides <150 mg/dL 711(H) - 398(H) - 366(H)  Creatinine 0.50 - 1.05 mg/dL 2.14(H) - 1.37(H) 1.97(H) -   BP/Weight 10/09/2015 07/25/2015 07/02/2015 04/25/2015 03/12/2015 10/02/2014 [id number]  Systolic BP A999333 AB-123456789 0000000 A999333 123456 Q000111Q 123456  Diastolic BP 86 82 84 80 72 78 72  Wt. (Lbs) 199.8 201 198 203 198.08 196 200  BMI 32.26 32.46 31.97 32.78 31.99 31.65 32.3   Foot/eye exam completion dates Latest Ref Rng 03/12/2015 04/27/2014  Eye Exam No Retinopathy - No Retinopathy  Foot Form Completion - Done -

## 2015-10-09 NOTE — Telephone Encounter (Signed)
Pls call pt and let her know on further thought , since she is having burning in the stomach with food , I recommend hiogher dose of the omeprazole short term for next 3 months to 40 mg daily, I hav entered 30 day supply only with refills , if mail order will need to change to 90 day if possible, she may take two 20 mg  Caps daily till done, pls send in after you  spk with her

## 2015-10-09 NOTE — Assessment & Plan Note (Signed)
Unchanged, having current flare now that it is the Summer, advised to limit sun exposure

## 2015-10-09 NOTE — Assessment & Plan Note (Signed)
deteriorated , no med change at this time

## 2015-10-09 NOTE — Patient Instructions (Signed)
Annual exam in early Nov , cancel September  ENSURE 64  To 72 ounces water every   Repeat labs NON fasting in 3 weeks, chem 7 and EGFR, CPK,, hepatic panel , magnesium and calcium   Fasting lipid, cmp and EGFR for early Nov  NO fatty foods in your diet please, butter, cheese, egg yolk,  Call if stomach pain continues you need GI to look in your stomac 

## 2015-10-09 NOTE — Assessment & Plan Note (Signed)
Uncontrolled, not at goal , no med change at this time DASH diet and commitment to daily physical activity for a minimum of 30 minutes discussed and encouraged, as a part of hypertension management. The importance of attaining a healthy weight is also discussed.  BP/Weight 10/09/2015 07/25/2015 07/02/2015 04/25/2015 03/12/2015 10/02/2014 [id number]  Systolic BP A999333 AB-123456789 0000000 A999333 123456 Q000111Q 123456  Diastolic BP 86 82 84 80 72 78 72  Wt. (Lbs) 199.8 201 198 203 198.08 196 200  BMI 32.26 32.46 31.97 32.78 31.99 31.65 32.3

## 2015-10-09 NOTE — Assessment & Plan Note (Signed)
deteriroated and statin therapy is contraindicated due to chronic severe myalgia and elevated CPK  Hyperlipidemia:Low fat diet discussed and encouraged.   Lipid Panel  Lab Results  Component Value Date   CHOL 304* 10/07/2015   HDL 54 10/07/2015   LDLCALC NOT CALC 10/07/2015   LDLDIRECT 90 09/26/2014   TRIG 711* 10/07/2015   CHOLHDL 5.6* 10/07/2015      Updated lab needed at/ before next visit.

## 2015-10-09 NOTE — Progress Notes (Signed)
Kathryn Beck     MRN: [id number]      DOB: [birth year]   HPI Kathryn Beck is here stating that she just feels very badly all over, with the heat her cramps are worse ansd she is neither eating much, states stomach hurts when sheeats , and she is not drinking enough water, labs show dehydration and deterioration in kidney function Denies being depressed , but states she worries a lot about her kidneys She also has  Follow up of chronic illnesses  And re evaluation of any  available recent lab and radiology data.  Preventive health is updated, specifically  Cancer screening and Immunization.   Has upcoming endo appt The PT denies any adverse reactions to current medications since the last visit.  ROS Denies recent fever or chills.c/o fatigue and generalized muscle pain Denies sinus pressure, nasal congestion, ear pain or sore throat. Denies chest congestion, productive cough or wheezing. Denies chest pains, palpitations and leg swelling Denies abdominal pain, nausea, vomiting,diarrhea or constipation.   Denies dysuria, frequency, hesitancy or incontinence.  Denies headaches, seizures, numbness, or tingling. C/o rash when exposed to the sun  PE  BP 142/86 mmHg  Pulse 89  Resp 16  Ht 5\' 6"  (1.676 m)  Wt 199 lb 12.8 oz (90.629 kg)  BMI 32.26 kg/m2  SpO2 100%  Patient alert and oriented and in no cardiopulmonary distress.  HEENT: No facial asymmetry, EOMI,   oropharynx pink and moist.  Neck supple no JVD, no mass.  Chest: Clear to auscultation bilaterally.  CVS: S1, S2 no murmurs, no S3.Regular rate.  ABD: Soft non tender.   Ext: No edema  MS: Adequate though reduced  ROM spine, shoulders, hips and knees.  Skin: Intact, no ulcerations or rash noted.  Psych: Good eye contact, normal affect. Memory intact not anxious or depressed appearing.  CNS: CN 2-12 intact, power,  normal throughout.no focal deficits noted.   Assessment & Plan  Essential  hypertension Uncontrolled, not at goal , no med change at this time DASH diet and commitment to daily physical activity for a minimum of 30 minutes discussed and encouraged, as a part of hypertension management. The importance of attaining a healthy weight is also discussed.  BP/Weight 10/09/2015 07/25/2015 07/02/2015 04/25/2015 03/12/2015 10/02/2014 [id number]  Systolic BP A999333 AB-123456789 0000000 A999333 123456 Q000111Q 123456  Diastolic BP 86 82 84 80 72 78 72  Wt. (Lbs) 199.8 201 198 203 198.08 196 200  BMI 32.26 32.46 31.97 32.78 31.99 31.65 32.3        GERD (gastroesophageal reflux disease) Uncontrolled, c/o abd pain when she eats, wants to holsd off on GI eval will call beack in next 2 to 3 weeks if persists Will increase omeprazole to 40 mg for next 2 months Will need to call her about this  Diabetes mellitus, insulin dependent (IDDM), uncontrolled Uncontrolled, treated by endo Kathryn Beck is reminded of the importance of commitment to daily physical activity for 30 minutes or more, as able and the need to limit carbohydrate intake to 30 to 60 grams per meal to help with blood sugar control.   The need to take medication as prescribed, test blood sugar as directed, and to call between visits if there is a concern that blood sugar is uncontrolled is also discussed.   Kathryn Beck is reminded of the importance of daily foot exam, annual eye examination, and good blood sugar, blood pressure and cholesterol control.  Diabetic Labs Latest Ref Rng 10/07/2015 07/25/2015  07/23/2015 04/25/2015 03/12/2015  HbA1c <5.7 % 10.2(H) 10.3(H) - - 10.6(H)  Microalbumin <2.0 mg/dL - - - - -  Micro/Creat Ratio 0.0 - 30.0 mg/g - - - - -  Chol 125 - 200 mg/dL 304(H) - 230(H) - 245(H)  HDL >=46 mg/dL 54 - 41(L) - 38(L)  Calc LDL <130 mg/dL NOT CALC - 109 - 134(H)  Triglycerides <150 mg/dL 711(H) - 398(H) - 366(H)  Creatinine 0.50 - 1.05 mg/dL 2.14(H) - 1.37(H) 1.97(H) -   BP/Weight 10/09/2015 07/25/2015 07/02/2015 04/25/2015 03/12/2015  10/02/2014 [id number]  Systolic BP A999333 AB-123456789 0000000 A999333 123456 Q000111Q 123456  Diastolic BP 86 82 84 80 72 78 72  Wt. (Lbs) 199.8 201 198 203 198.08 196 200  BMI 32.26 32.46 31.97 32.78 31.99 31.65 32.3   Foot/eye exam completion dates Latest Ref Rng 03/12/2015 04/27/2014  Eye Exam No Retinopathy - No Retinopathy  Foot Form Completion - Done -         Mixed hyperlipidemia deteriroated and statin therapy is contraindicated due to chronic severe myalgia and elevated CPK  Hyperlipidemia:Low fat diet discussed and encouraged.   Lipid Panel  Lab Results  Component Value Date   CHOL 304* 10/07/2015   HDL 54 10/07/2015   LDLCALC NOT CALC 10/07/2015   LDLDIRECT 90 09/26/2014   TRIG 711* 10/07/2015   CHOLHDL 5.6* 10/07/2015      Updated lab needed at/ before next visit.   Hyperuricemia deteriorated , no med change at this time  Heat sensitivity Unchanged, having current flare now that it is the Summer, advised to limit sun exposure

## 2015-10-10 ENCOUNTER — Telehealth: Payer: Self-pay | Admitting: Family Medicine

## 2015-10-10 ENCOUNTER — Inpatient Hospital Stay (HOSPITAL_COMMUNITY)
Admission: EM | Admit: 2015-10-10 | Discharge: 2015-10-16 | DRG: 684 | Disposition: A | Discharge status: Home / Self Care | Payer: 59 | Attending: Internal Medicine | Admitting: Internal Medicine

## 2015-10-10 ENCOUNTER — Encounter (HOSPITAL_COMMUNITY): Payer: Self-pay | Admitting: Emergency Medicine

## 2015-10-10 ENCOUNTER — Inpatient Hospital Stay (HOSPITAL_COMMUNITY): Payer: 59

## 2015-10-10 DIAGNOSIS — E1165 Type 2 diabetes mellitus with hyperglycemia: Secondary | ICD-10-CM | POA: Diagnosis present

## 2015-10-10 DIAGNOSIS — R1010 Upper abdominal pain, unspecified: Secondary | ICD-10-CM | POA: Diagnosis not present

## 2015-10-10 DIAGNOSIS — G4730 Sleep apnea, unspecified: Secondary | ICD-10-CM | POA: Diagnosis present

## 2015-10-10 DIAGNOSIS — I129 Hypertensive chronic kidney disease with stage 1 through stage 4 chronic kidney disease, or unspecified chronic kidney disease: Secondary | ICD-10-CM | POA: Diagnosis present

## 2015-10-10 DIAGNOSIS — N183 Chronic kidney disease, stage 3 (moderate): Secondary | ICD-10-CM | POA: Diagnosis present

## 2015-10-10 DIAGNOSIS — K3184 Gastroparesis: Secondary | ICD-10-CM | POA: Diagnosis present

## 2015-10-10 DIAGNOSIS — I10 Essential (primary) hypertension: Secondary | ICD-10-CM | POA: Diagnosis present

## 2015-10-10 DIAGNOSIS — N186 End stage renal disease: Secondary | ICD-10-CM

## 2015-10-10 DIAGNOSIS — E782 Mixed hyperlipidemia: Secondary | ICD-10-CM | POA: Diagnosis present

## 2015-10-10 DIAGNOSIS — Z9119 Patient's noncompliance with other medical treatment and regimen: Secondary | ICD-10-CM

## 2015-10-10 DIAGNOSIS — Z794 Long term (current) use of insulin: Secondary | ICD-10-CM

## 2015-10-10 DIAGNOSIS — K851 Biliary acute pancreatitis without necrosis or infection: Secondary | ICD-10-CM | POA: Diagnosis not present

## 2015-10-10 DIAGNOSIS — E860 Dehydration: Secondary | ICD-10-CM | POA: Diagnosis present

## 2015-10-10 DIAGNOSIS — E785 Hyperlipidemia, unspecified: Secondary | ICD-10-CM | POA: Diagnosis present

## 2015-10-10 DIAGNOSIS — R1314 Dysphagia, pharyngoesophageal phase: Secondary | ICD-10-CM | POA: Diagnosis present

## 2015-10-10 DIAGNOSIS — E1143 Type 2 diabetes mellitus with diabetic autonomic (poly)neuropathy: Secondary | ICD-10-CM | POA: Diagnosis present

## 2015-10-10 DIAGNOSIS — R109 Unspecified abdominal pain: Secondary | ICD-10-CM | POA: Diagnosis not present

## 2015-10-10 DIAGNOSIS — R1310 Dysphagia, unspecified: Secondary | ICD-10-CM | POA: Insufficient documentation

## 2015-10-10 DIAGNOSIS — K859 Acute pancreatitis without necrosis or infection, unspecified: Secondary | ICD-10-CM | POA: Diagnosis present

## 2015-10-10 DIAGNOSIS — R1013 Epigastric pain: Secondary | ICD-10-CM | POA: Diagnosis not present

## 2015-10-10 DIAGNOSIS — N179 Acute kidney failure, unspecified: Secondary | ICD-10-CM | POA: Diagnosis present

## 2015-10-10 DIAGNOSIS — Z90710 Acquired absence of both cervix and uterus: Secondary | ICD-10-CM | POA: Diagnosis not present

## 2015-10-10 DIAGNOSIS — K219 Gastro-esophageal reflux disease without esophagitis: Secondary | ICD-10-CM | POA: Insufficient documentation

## 2015-10-10 DIAGNOSIS — K2970 Gastritis, unspecified, without bleeding: Secondary | ICD-10-CM | POA: Diagnosis present

## 2015-10-10 DIAGNOSIS — K858 Other acute pancreatitis without necrosis or infection: Secondary | ICD-10-CM

## 2015-10-10 DIAGNOSIS — E669 Obesity, unspecified: Secondary | ICD-10-CM | POA: Diagnosis present

## 2015-10-10 DIAGNOSIS — E1122 Type 2 diabetes mellitus with diabetic chronic kidney disease: Secondary | ICD-10-CM | POA: Diagnosis present

## 2015-10-10 DIAGNOSIS — E1029 Type 1 diabetes mellitus with other diabetic kidney complication: Secondary | ICD-10-CM | POA: Diagnosis not present

## 2015-10-10 LAB — URINALYSIS, ROUTINE W REFLEX MICROSCOPIC
BILIRUBIN URINE: NEGATIVE
Glucose, UA: 250 mg/dL — AB
Ketones, ur: NEGATIVE mg/dL
Leukocytes, UA: NEGATIVE
Nitrite: NEGATIVE
Specific Gravity, Urine: 1.015 (ref 1.005–1.030)
pH: 6 (ref 5.0–8.0)

## 2015-10-10 LAB — MICROALBUMIN / CREATININE URINE RATIO
CREATININE, UR: 62.5 mg/dL
Microalb Creat Ratio: 5195 mg/g creat — ABNORMAL HIGH (ref 0.0–30.0)
Microalb, Ur: 3246.9 ug/mL — ABNORMAL HIGH

## 2015-10-10 LAB — COMPREHENSIVE METABOLIC PANEL
ALBUMIN: 3.7 g/dL (ref 3.5–5.0)
ALK PHOS: 95 U/L (ref 38–126)
ALT: 28 U/L (ref 14–54)
ANION GAP: 8 (ref 5–15)
AST: 23 U/L (ref 15–41)
BILIRUBIN TOTAL: 0.4 mg/dL (ref 0.3–1.2)
BUN: 40 mg/dL — AB (ref 6–20)
CO2: 22 mmol/L (ref 22–32)
Calcium: 9.6 mg/dL (ref 8.9–10.3)
Chloride: 103 mmol/L (ref 101–111)
Creatinine, Ser: 2.03 mg/dL — ABNORMAL HIGH (ref 0.44–1.00)
GFR calc Af Amer: 30 mL/min — ABNORMAL LOW (ref >60)
GFR calc non Af Amer: 26 mL/min — ABNORMAL LOW (ref >60)
GLUCOSE: 101 mg/dL — AB (ref 65–99)
POTASSIUM: 5 mmol/L (ref 3.5–5.1)
SODIUM: 133 mmol/L — AB (ref 135–145)
Total Protein: 7.9 g/dL (ref 6.5–8.1)

## 2015-10-10 LAB — CBC
HCT: 40.5 % (ref 36.0–46.0)
Hemoglobin: 13.1 g/dL (ref 12.0–15.0)
MCH: 22.6 pg — AB (ref 26.0–34.0)
MCHC: 32.3 g/dL (ref 30.0–36.0)
MCV: 69.9 fL — ABNORMAL LOW (ref 78.0–100.0)
PLATELETS: 380 10*3/uL (ref 150–400)
RBC: 5.79 MIL/uL — AB (ref 3.87–5.11)
RDW: 16 % — ABNORMAL HIGH (ref 11.5–15.5)
WBC: 8.5 10*3/uL (ref 4.0–10.5)

## 2015-10-10 LAB — GLUCOSE, CAPILLARY: Glucose-Capillary: 144 mg/dL — ABNORMAL HIGH (ref 65–99)

## 2015-10-10 LAB — LIPASE, BLOOD: LIPASE: 133 U/L — AB (ref 11–51)

## 2015-10-10 LAB — URINE MICROSCOPIC-ADD ON

## 2015-10-10 LAB — CBG MONITORING, ED: GLUCOSE-CAPILLARY: 129 mg/dL — AB (ref 65–99)

## 2015-10-10 MED ORDER — AMLODIPINE BESYLATE 5 MG PO TABS
10.0000 mg | ORAL_TABLET | Freq: Every day | ORAL | Status: DC
  Administered 2015-10-11 – 2015-10-16 (×6): 10 mg via ORAL
  Filled 2015-10-10 (×6): qty 2

## 2015-10-10 MED ORDER — INSULIN DETEMIR 100 UNIT/ML ~~LOC~~ SOLN
25.0000 [IU] | Freq: Every day | SUBCUTANEOUS | Status: DC
  Administered 2015-10-10 – 2015-10-12 (×3): 25 [IU] via SUBCUTANEOUS
  Filled 2015-10-10 (×4): qty 0.25

## 2015-10-10 MED ORDER — SODIUM CHLORIDE 0.9 % IV SOLN
INTRAVENOUS | Status: DC
  Administered 2015-10-10 – 2015-10-16 (×11): via INTRAVENOUS

## 2015-10-10 MED ORDER — GABAPENTIN 100 MG PO CAPS
100.0000 mg | ORAL_CAPSULE | Freq: Three times a day (TID) | ORAL | Status: DC
  Administered 2015-10-10 – 2015-10-16 (×19): 100 mg via ORAL
  Filled 2015-10-10 (×19): qty 1

## 2015-10-10 MED ORDER — INSULIN ASPART 100 UNIT/ML ~~LOC~~ SOLN
0.0000 [IU] | SUBCUTANEOUS | Status: DC
  Administered 2015-10-10 (×2): 2 [IU] via SUBCUTANEOUS
  Administered 2015-10-11 (×2): 3 [IU] via SUBCUTANEOUS
  Administered 2015-10-11: 8 [IU] via SUBCUTANEOUS
  Administered 2015-10-11: 3 [IU] via SUBCUTANEOUS
  Administered 2015-10-11: 5 [IU] via SUBCUTANEOUS
  Administered 2015-10-12: 3 [IU] via SUBCUTANEOUS

## 2015-10-10 MED ORDER — CLONIDINE HCL 0.1 MG PO TABS
0.1000 mg | ORAL_TABLET | Freq: Two times a day (BID) | ORAL | Status: DC
  Administered 2015-10-10 – 2015-10-16 (×12): 0.1 mg via ORAL
  Filled 2015-10-10 (×12): qty 1

## 2015-10-10 MED ORDER — ONDANSETRON HCL 4 MG/2ML IJ SOLN
4.0000 mg | Freq: Once | INTRAMUSCULAR | Status: AC
  Administered 2015-10-10: 4 mg via INTRAVENOUS
  Filled 2015-10-10: qty 2

## 2015-10-10 MED ORDER — HEPARIN SODIUM (PORCINE) 5000 UNIT/ML IJ SOLN
5000.0000 [IU] | Freq: Three times a day (TID) | INTRAMUSCULAR | Status: DC
  Administered 2015-10-10 – 2015-10-12 (×5): 5000 [IU] via SUBCUTANEOUS
  Filled 2015-10-10 (×7): qty 1

## 2015-10-10 MED ORDER — MORPHINE SULFATE (PF) 2 MG/ML IV SOLN
2.0000 mg | INTRAVENOUS | Status: DC | PRN
  Administered 2015-10-11 – 2015-10-15 (×6): 2 mg via INTRAVENOUS
  Filled 2015-10-10 (×7): qty 1

## 2015-10-10 MED ORDER — SODIUM CHLORIDE 0.9 % IV SOLN: INTRAVENOUS | Status: DC

## 2015-10-10 MED ORDER — FENTANYL CITRATE (PF) 100 MCG/2ML IJ SOLN
50.0000 ug | Freq: Once | INTRAMUSCULAR | Status: AC
  Administered 2015-10-10: 50 ug via INTRAVENOUS
  Filled 2015-10-10: qty 2

## 2015-10-10 MED ORDER — BISACODYL 10 MG RE SUPP: 10.0000 mg | Freq: Every day | RECTAL | Status: DC | PRN

## 2015-10-10 MED ORDER — HYDRALAZINE HCL 20 MG/ML IJ SOLN: 10.0000 mg | INTRAMUSCULAR | Status: DC | PRN

## 2015-10-10 MED ORDER — DOCUSATE SODIUM 100 MG PO CAPS
100.0000 mg | ORAL_CAPSULE | Freq: Two times a day (BID) | ORAL | Status: DC
  Administered 2015-10-11 – 2015-10-16 (×11): 100 mg via ORAL
  Filled 2015-10-10 (×14): qty 1

## 2015-10-10 NOTE — Telephone Encounter (Signed)
Wondering why she wasn't on cholesterol med and was advised that stain therapy is contraindicated due to elevated CPK and LFT's. Will discuss with hospitalist about starting medication andappt scheduled for follow up next week

## 2015-10-10 NOTE — H&P (Signed)
History and Physical    Kathryn Beck YBO:[id number] DOB: [birth year] DOA: 10/10/2015  PCP: Tula Nakayama, MD  Patient coming from: Home  Chief Complaint: abd pain  HPI: Kathryn Beck is a 60 y.o. female with medical history significant of type 2 diabetes mellitus, hypertension, obesity, sleep apnea, hyperlipidemia who initially presented to PCP on 10/09/2015 with complaints of feeling "bad all over" with abdominal cramping, decreased appetite and dehydration. Specifically, patient reported abdominal pain with eating. Patient was thought to have gastroesophageal reflux disease and was given a course of PPI. Symptoms worsened and patient subsequently presented to the emergency department.  ED Course: In emergency department, patient was noted have a lipase of 133 with triglycerides of 711. Presenting creatinine noted to be 2.03 (was 1.37 on 07/23/2015) disease. Patient was started on IV fluids with analgesics. Hospitalist service was consulted for consideration for admission  Review of Systems:  Review of Systems  Constitutional: Positive for malaise/fatigue. Negative for fever and chills.  HENT: Negative for ear pain and tinnitus.   Eyes: Negative for photophobia and pain.  Respiratory: Negative for sputum production and wheezing.   Cardiovascular: Negative for palpitations and orthopnea.  Gastrointestinal: Positive for abdominal pain. Negative for constipation.  Genitourinary: Negative for frequency and flank pain.  Musculoskeletal: Negative for back pain and falls.  Neurological: Positive for weakness. Negative for tingling, tremors and loss of consciousness.  Psychiatric/Behavioral: Negative for hallucinations and memory loss.     Past Medical History  Diagnosis Date  . Type 2 diabetes mellitus, uncontrolled ([place])   . Essential hypertension   . Back pain   . Anemia   . Obesity   . Axillary masses     Soft tissue - status post excision  . Sleep apnea     Noncompliant  with CPAP  . Mixed hyperlipidemia     Past Surgical History  Procedure Laterality Date  . Abdominal hysterectomy    . Lung biopsy    . Foot surgery Bilateral   . Mass excision Right 01/09/2013    Procedure: EXCISION OF NEOPLASM OF RIGHT  AXILLA  AND EXCISION OF NEOPLASM OF LEFT AXILLA;  Surgeon: Jamesetta So, MD;  Location: [facility];  Service: General;  Laterality: Right;  procedure end @ 08:23     reports that she has never smoked. She does not have any smokeless tobacco history on file. She reports that she does not drink alcohol or use illicit drugs.  Allergies  Allergen Reactions  . Ace Inhibitors Anaphylaxis and Swelling  . Penicillins Itching and Swelling    Has patient had a PCN reaction causing immediate rash, facial/tongue/throat swelling, SOB or lightheadedness with hypotension: Yes Has patient had a PCN reaction causing severe rash involving mucus membranes or skin necrosis: No Has patient had a PCN reaction that required hospitalization No Has patient had a PCN reaction occurring within the last 10 years: No If all of the above answers are "NO", then may proceed with Cephalosporin use.   . Statins Other (See Comments)    elevated LFT's    History reviewed. No pertinent family history.   Prior to Admission medications   Medication Sig Start Date End Date Taking? Authorizing Provider  acetaminophen (TYLENOL) 325 MG tablet Take 650 mg by mouth every 6 (six) hours as needed for mild pain.   Yes Historical Provider, MD  allopurinol (ZYLOPRIM) 300 MG tablet TAKE 1 TABLET (300 MG TOTAL) BY MOUTH DAILY. 05/13/15  Yes Fayrene Helper, MD  amLODipine (  NORVASC) 10 MG tablet Take 1 tablet by mouth  daily 10/07/15  Yes Fayrene Helper, MD  azelastine (ASTELIN) 0.1 % nasal spray Place 2 sprays into both nostrils 2 (two) times daily. Use in each nostril as directed 07/25/15  Yes Fayrene Helper, MD  cloNIDine (CATAPRES) 0.1 MG tablet Take 1 tablet by mouth  every evening at  9pm for  blood pressure 10/07/15  Yes Fayrene Helper, MD  cyclobenzaprine (FLEXERIL) 10 MG tablet Take 1 tablet by mouth  every night at bedtime as  needed for muscle spasm(s) 08/13/15  Yes Fayrene Helper, MD  ergocalciferol (VITAMIN D2) 50000 UNITS capsule Take 1 capsule (50,000 Units total) by mouth once a week. One capsule once weekly 10/02/14  Yes Fayrene Helper, MD  gabapentin (NEURONTIN) 100 MG capsule Take 1 capsule (100 mg total) by mouth 3 (three) times daily. 03/12/15  Yes Fayrene Helper, MD  Insulin Detemir (LEVEMIR FLEXTOUCH) 100 UNIT/ML Pen INJECT 90 UNITS INTO THE SKIN DAILY AT 10 PM. 08/30/15  Yes Fayrene Helper, MD  insulin lispro (HUMALOG) 100 UNIT/ML injection Inject 10-25 Units into the skin See admin instructions. Twice daily as needed before meals. Patient uses according to sliding scale at home   Yes Historical Provider, MD  INVOKANA 100 MG TABS tablet TAKE 1 TABLET EVERY DAY 04/08/15  Yes Fayrene Helper, MD  omeprazole (PRILOSEC) 40 MG capsule Take 1 capsule (40 mg total) by mouth daily. 10/09/15  Yes Fayrene Helper, MD  ranitidine (ZANTAC) 150 MG tablet Take 1 tablet (150 mg total) by mouth 2 (two) times daily. 07/25/15  Yes Fayrene Helper, MD  spironolactone (ALDACTONE) 25 MG tablet Take 1 tablet by mouth two  times daily 10/07/15  Yes Fayrene Helper, MD  triamterene-hydrochlorothiazide [facility]) 75-50 MG tablet Take 1 tablet by mouth  daily 10/07/15  Yes Fayrene Helper, MD  valsartan (DIOVAN) 320 MG tablet Take 1 tablet by mouth  daily 01/28/15  Yes Fayrene Helper, MD  vitamin B-12 (CYANOCOBALAMIN) 1000 MCG tablet Take 1,000 mcg by mouth daily.   Yes Historical Provider, MD  ACCU-CHEK FASTCLIX LANCETS MISC Use as instructed three times daily dx E11.65 08/20/15   Fayrene Helper, MD  Blood Glucose Monitoring Suppl [facility] VERIO IQ SYSTEM) w/Device KIT Three times daily testing dx E11.65 05/14/15   Fayrene Helper, MD  fluticasone [facility])  50 MCG/ACT nasal spray Place 2 sprays into both nostrils daily. Patient not taking: Reported on 10/10/2015 07/31/13   Fayrene Helper, MD  glucose blood test strip One touch verio strips, three times daily testing 05/14/15   Fayrene Helper, MD  HYDROcodone-acetaminophen (NORCO/VICODIN) 5-325 MG tablet One tablet at bedtime, as needed, for uncontrolled back pain Thirty to last 3 months Patient not taking: Reported on 10/10/2015 05/17/15   Fayrene Helper, MD  Insulin Pen Needle (B-D UF III MINI PEN NEEDLES) 31G X 5 MM MISC Use as instructed three times daily 08/20/15   Fayrene Helper, MD  montelukast (SINGULAIR) 10 MG tablet TAKE 1 TABLET (10 MG TOTAL) BY MOUTH AT BEDTIME. Patient not taking: Reported on 10/10/2015 06/28/15   Fayrene Helper, MD  montelukast (SINGULAIR) 10 MG tablet Take 1 tablet by mouth at  bedtime Patient not taking: Reported on 10/10/2015 08/13/15   Fayrene Helper, MD    Physical Exam: Filed Vitals:   10/10/15 1242  BP: 142/81  Pulse: 94  Temp: 98.3 F (36.8 C)  TempSrc: Oral  Resp: 20  Height: 5' 6"  (1.676 m)  Weight: 90.266 kg (199 lb)  SpO2: 97%      Constitutional: NAD, calm, comfortable Filed Vitals:   10/10/15 1242  BP: 142/81  Pulse: 94  Temp: 98.3 F (36.8 C)  TempSrc: Oral  Resp: 20  Height: 5' 6"  (1.676 m)  Weight: 90.266 kg (199 lb)  SpO2: 97%   Eyes: PERRL, lids and conjunctivae normal ENMT: Mucous membranes are moist. Posterior pharynx clear of any exudate or lesions.Normal dentition.  Neck: normal, supple, no masses, no thyromegaly Respiratory: clear to auscultation bilaterally, no wheezing, no crackles. Normal respiratory effort. No accessory muscle use.  Cardiovascular: Regular rate and rhythm.  Abdomen: no tenderness, no masses palpated. No hepatosplenomegaly. Bowel sounds positive.  Musculoskeletal: no clubbing / cyanosis. No joint deformity upper and lower extremities. Good ROM, no contractures. Normal muscle tone.    Skin: no rashes, lesions, ulcers. No induration Neurologic: CN 2-12 grossly intact. Sensation intact, DTR normal. Strength 5/5 in all 4.  Psychiatric: Normal judgment and insight. Alert and oriented x 3. Normal mood.    Labs on Admission: I have personally reviewed following labs and imaging studies  CBC:  Recent Labs Lab 10/10/15 1256  WBC 8.5  HGB 13.1  HCT 40.5  MCV 69.9*  PLT 756   Basic Metabolic Panel:  Recent Labs Lab 10/07/15 1425 10/10/15 1256  NA 137 133*  K 5.2 5.0  CL 101 103  CO2 24 22  GLUCOSE 95 101*  BUN 39* 40*  CREATININE 2.14* 2.03*  CALCIUM 10.3 9.6   GFR: Estimated Creatinine Clearance: 33.8 mL/min (by C-G formula based on Cr of 2.03). Liver Function Tests:  Recent Labs Lab 10/10/15 1256  AST 23  ALT 28  ALKPHOS 95  BILITOT 0.4  PROT 7.9  ALBUMIN 3.7    Recent Labs Lab 10/10/15 1256  LIPASE 133*   No results for input(s): AMMONIA in the last 168 hours. Coagulation Profile: No results for input(s): INR, PROTIME in the last 168 hours. Cardiac Enzymes: No results for input(s): CKTOTAL, CKMB, CKMBINDEX, TROPONINI in the last 168 hours. BNP (last 3 results) No results for input(s): PROBNP in the last 8760 hours. HbA1C: No results for input(s): HGBA1C in the last 72 hours. CBG: No results for input(s): GLUCAP in the last 168 hours. Lipid Profile: No results for input(s): CHOL, HDL, LDLCALC, TRIG, CHOLHDL, LDLDIRECT in the last 72 hours. Thyroid Function Tests: No results for input(s): TSH, T4TOTAL, FREET4, T3FREE, THYROIDAB in the last 72 hours. Anemia Panel: No results for input(s): VITAMINB12, FOLATE, FERRITIN, TIBC, IRON, RETICCTPCT in the last 72 hours. Urine analysis:    Component Value Date/Time   COLORURINE STRAW* 10/10/2015 1250   APPEARANCEUR CLEAR 10/10/2015 1250   LABSPEC 1.015 10/10/2015 1250   PHURINE 6.0 10/10/2015 1250   GLUCOSEU 250* 10/10/2015 1250   HGBUR SMALL* 10/10/2015 1250   BILIRUBINUR NEGATIVE  10/10/2015 1250   BILIRUBINUR neg 04/25/2015 1505   KETONESUR NEGATIVE 10/10/2015 1250   PROTEINUR >300* 10/10/2015 1250   PROTEINUR >300 04/25/2015 1505   UROBILINOGEN 0.2 04/25/2015 1505   NITRITE NEGATIVE 10/10/2015 1250   NITRITE neg 04/25/2015 1505   LEUKOCYTESUR NEGATIVE 10/10/2015 1250   Sepsis Labs: !!!!!!!!!!!!!!!!!!!!!!!!!!!!!!!!!!!!!!!!!!!! @LABRCNTIP (procalcitonin:4,lacticidven:4) )No results found for this or any previous visit (from the past 240 hour(s)).   Radiological Exams on Admission: No results found.  Assessment/Plan Principal Problem:   Pancreatitis Active Problems:   Diabetes mellitus, insulin dependent (IDDM), uncontrolled ([place])  Mixed hyperlipidemia   Obesity   Essential hypertension  Abdominal pain, suspected acute pancreatitis -Mildly elevated lipase on admission with upper quadrant abdominal pain. -Patient Nothing by mouth with hydration and analgesics as needed. -Admit to MedSurg for now -When symptoms improve, consider advancing diet as tolerated at that time  Diabetes mellitus -We'll continue patient on supplemental sliding regimen -Will cont on lower dose of levemir as pt is NPO  Hyperlipidemia -Seems stable at present  Obesity -Stable  Hypertension -Blood pressure stable at present  Acute renal failure -Patient with creatinine of over 2. -Continue patient with IV fluids as per above -Monitor renal function closely  HTN -Hold diuretics and nephrotoxic agents for now -Cont on PRN hydralazine  DVT prophylaxis: Heparin Code Status: Full code Family Communication: Patient in room Disposition Plan: Uncertain at this time Consults called:  Admission status: Admit inpatient med surge   Jasper Hanf, Orpah Melter MD [facility] Pager [phone]  If 7PM-7AM, please contact night-coverage [email or web address] Password [facility]  10/10/2015, 3:53 PM

## 2015-10-10 NOTE — Telephone Encounter (Signed)
Kathryn Beck is asking for a phone call from the nurse in regards to her mother Kathryn Beck, please advise?

## 2015-10-10 NOTE — Telephone Encounter (Signed)
Called patient and left message for them to return call at the office   

## 2015-10-10 NOTE — ED Notes (Signed)
Pt states she has been having generalized pain for the past few days.  Saw pcp yesterday and was told she was dehydrated and to come to ER if no better.  States her stomach hurts everytime she eats or drinks x 2 weeks.

## 2015-10-10 NOTE — ED Provider Notes (Signed)
CSN: [id number]     Arrival date & time 10/10/15  1227 History   First MD Initiated Contact with Patient 10/10/15 1356     Chief Complaint  Patient presents with  . Abdominal Pain     (Consider location/radiation/quality/duration/timing/severity/associated sxs/prior Treatment) HPI   Kathryn Beck is a 60 y.o. female who complains of intermittent, crampy upper abdominal pain, and generalized myalgia, for 2 weeks. He saw her PCP, several days ago and at that time was told that she was "dehydrated". She has been trying to drink more fluids, but having difficulty because, ED, and drinking cause abdominal pain. Her physician also did lipid panel testing, which was notable for marked elevation of cholesterol and triglycerides. Patient has previously been treated for hyper lipidemia, but had to stop medications because of "liver problems". She does not drink alcohol or use tobacco products. No prior history of pancreatitis. No fever, chills, cough, chest pain, focal weakness or paresthesia. There are no other no modifying factors.   Past Medical History  Diagnosis Date  . Type 2 diabetes mellitus, uncontrolled ([place])   . Essential hypertension   . Back pain   . Anemia   . Obesity   . Axillary masses     Soft tissue - status post excision  . Sleep apnea     Noncompliant with CPAP  . Mixed hyperlipidemia    Past Surgical History  Procedure Laterality Date  . Abdominal hysterectomy    . Lung biopsy    . Foot surgery Bilateral   . Mass excision Right 01/09/2013    Procedure: EXCISION OF NEOPLASM OF RIGHT  AXILLA  AND EXCISION OF NEOPLASM OF LEFT AXILLA;  Surgeon: Jamesetta So, MD;  Location: [facility];  Service: General;  Laterality: Right;  procedure end @ 08:23   History reviewed. No pertinent family history. Social History  Substance Use Topics  . Smoking status: Never Smoker   . Smokeless tobacco: None  . Alcohol Use: No   OB History    No data available     Review of Systems   All other systems reviewed and are negative.     Allergies  Ace inhibitors; Penicillins; and Statins  Home Medications   Prior to Admission medications   Medication Sig Start Date End Date Taking? Authorizing Provider  acetaminophen (TYLENOL) 325 MG tablet Take 650 mg by mouth every 6 (six) hours as needed for mild pain.   Yes Historical Provider, MD  allopurinol (ZYLOPRIM) 300 MG tablet TAKE 1 TABLET (300 MG TOTAL) BY MOUTH DAILY. 05/13/15  Yes Fayrene Helper, MD  amLODipine (NORVASC) 10 MG tablet Take 1 tablet by mouth  daily 10/07/15  Yes Fayrene Helper, MD  azelastine (ASTELIN) 0.1 % nasal spray Place 2 sprays into both nostrils 2 (two) times daily. Use in each nostril as directed 07/25/15  Yes Fayrene Helper, MD  cloNIDine (CATAPRES) 0.1 MG tablet Take 1 tablet by mouth  every evening at 9pm for  blood pressure 10/07/15  Yes Fayrene Helper, MD  cyclobenzaprine (FLEXERIL) 10 MG tablet Take 1 tablet by mouth  every night at bedtime as  needed for muscle spasm(s) 08/13/15  Yes Fayrene Helper, MD  ergocalciferol (VITAMIN D2) 50000 UNITS capsule Take 1 capsule (50,000 Units total) by mouth once a week. One capsule once weekly 10/02/14  Yes Fayrene Helper, MD  gabapentin (NEURONTIN) 100 MG capsule Take 1 capsule (100 mg total) by mouth 3 (three) times daily. 03/12/15  Yes Fayrene Helper, MD  Insulin Detemir (LEVEMIR FLEXTOUCH) 100 UNIT/ML Pen INJECT 90 UNITS INTO THE SKIN DAILY AT 10 PM. 08/30/15  Yes Fayrene Helper, MD  insulin lispro (HUMALOG) 100 UNIT/ML injection Inject 10-25 Units into the skin See admin instructions. Twice daily as needed before meals. Patient uses according to sliding scale at home   Yes Historical Provider, MD  INVOKANA 100 MG TABS tablet TAKE 1 TABLET EVERY DAY 04/08/15  Yes Fayrene Helper, MD  omeprazole (PRILOSEC) 40 MG capsule Take 1 capsule (40 mg total) by mouth daily. 10/09/15  Yes Fayrene Helper, MD  ranitidine (ZANTAC) 150 MG  tablet Take 1 tablet (150 mg total) by mouth 2 (two) times daily. 07/25/15  Yes Fayrene Helper, MD  spironolactone (ALDACTONE) 25 MG tablet Take 1 tablet by mouth two  times daily 10/07/15  Yes Fayrene Helper, MD  triamterene-hydrochlorothiazide [facility]) 75-50 MG tablet Take 1 tablet by mouth  daily 10/07/15  Yes Fayrene Helper, MD  valsartan (DIOVAN) 320 MG tablet Take 1 tablet by mouth  daily 01/28/15  Yes Fayrene Helper, MD  vitamin B-12 (CYANOCOBALAMIN) 1000 MCG tablet Take 1,000 mcg by mouth daily.   Yes Historical Provider, MD  ACCU-CHEK FASTCLIX LANCETS MISC Use as instructed three times daily dx E11.65 08/20/15   Fayrene Helper, MD  Blood Glucose Monitoring Suppl [facility] VERIO IQ SYSTEM) w/Device KIT Three times daily testing dx E11.65 05/14/15   Fayrene Helper, MD  fluticasone [facility]) 50 MCG/ACT nasal spray Place 2 sprays into both nostrils daily. Patient not taking: Reported on 10/10/2015 07/31/13   Fayrene Helper, MD  glucose blood test strip One touch verio strips, three times daily testing 05/14/15   Fayrene Helper, MD  HYDROcodone-acetaminophen (NORCO/VICODIN) 5-325 MG tablet One tablet at bedtime, as needed, for uncontrolled back pain Thirty to last 3 months Patient not taking: Reported on 10/10/2015 05/17/15   Fayrene Helper, MD  Insulin Pen Needle (B-D UF III MINI PEN NEEDLES) 31G X 5 MM MISC Use as instructed three times daily 08/20/15   Fayrene Helper, MD  montelukast (SINGULAIR) 10 MG tablet TAKE 1 TABLET (10 MG TOTAL) BY MOUTH AT BEDTIME. Patient not taking: Reported on 10/10/2015 06/28/15   Fayrene Helper, MD  montelukast (SINGULAIR) 10 MG tablet Take 1 tablet by mouth at  bedtime Patient not taking: Reported on 10/10/2015 08/13/15   Fayrene Helper, MD   BP 142/81 mmHg  Pulse 94  Temp(Src) 98.3 F (36.8 C) (Oral)  Resp 20  Ht _0  (1.676 m)  Wt 199 lb (90.266 kg)  BMI 32.13 kg/m2  SpO2 97% Physical Exam  Constitutional: She is  oriented to person, place, and time. She appears well-developed and well-nourished.  HENT:  Head: Normocephalic and atraumatic.  Right Ear: External ear normal.  Left Ear: External ear normal.  Eyes: Conjunctivae and EOM are normal. Pupils are equal, round, and reactive to light.  Neck: Normal range of motion and phonation normal. Neck supple.  Cardiovascular: Normal rate, regular rhythm and normal heart sounds.   Pulmonary/Chest: Effort normal and breath sounds normal. She exhibits no bony tenderness.  Abdominal: Soft. She exhibits no distension and no mass. There is tenderness (Epigastrium, right and left, moderate.). There is no rebound and no guarding.  Musculoskeletal: Normal range of motion. She exhibits no edema or tenderness.  Neurological: She is alert and oriented to person, place, and time. No cranial nerve deficit or  sensory deficit. She exhibits normal muscle tone. Coordination normal.  Skin: Skin is warm, dry and intact.  Psychiatric: She has a normal mood and affect. Her behavior is normal. Judgment and thought content normal.  Nursing note and vitals reviewed.   ED Course  Procedures (including critical care time) Medications  fentaNYL (SUBLIMAZE) injection 50 mcg (not administered)  ondansetron (ZOFRAN) injection 4 mg (not administered)    Patient Vitals for the past 24 hrs:  BP Temp Temp src Pulse Resp SpO2 Height Weight  10/10/15 1242 142/81 mmHg 98.3 F (36.8 C) Oral 94 20 97 % _0  (1.676 m) 199 lb (90.266 kg)    3:00 PM Reevaluation with update and discussion. After initial assessment and treatment, an updated evaluation reveals No change in clinical status. Findings discussed with patient, family members, all questions were answered.Daleen Bo L    3:06 PM-Consult complete with hospitalist. Patient case explained and discussed. He agrees to admit patient for further evaluation and treatment. Call ended at [place], BLOOD  - Abnormal; Notable for the following:    Lipase 133 (*)    All other components within normal limits  COMPREHENSIVE METABOLIC PANEL - Abnormal; Notable for the following:    Sodium 133 (*)    Glucose, Bld 101 (*)    BUN 40 (*)    Creatinine, Ser 2.03 (*)    GFR calc non Af Amer 26 (*)    GFR calc Af Amer 30 (*)    All other components within normal limits  CBC - Abnormal; Notable for the following:    RBC 5.79 (*)    MCV 69.9 (*)    MCH 22.6 (*)    RDW 16.0 (*)    All other components within normal limits  URINALYSIS, ROUTINE W REFLEX MICROSCOPIC (NOT AT [facility]) - Abnormal; Notable for the following:    Color, Urine STRAW (*)    Glucose, UA 250 (*)    Hgb urine dipstick SMALL (*)    Protein, ur >300 (*)    All other components within normal limits  URINE MICROSCOPIC-ADD ON - Abnormal; Notable for the following:    Squamous Epithelial / LPF 6-30 (*)    Bacteria, UA FEW (*)    All other components within normal limits    Imaging Review No results found. I have personally reviewed and evaluated these images and lab results as part of my medical decision-making.   EKG Interpretation None      MDM   Final diagnoses:  Other acute pancreatitis  Hyperlipidemia  AKI (acute kidney injury) ([place])    Acute pancreatitis, symptomatic, with pain, but essentially nontoxic. Likely source is hyperlipidemia. She has apparently failed treatment for hyperlipidemia in the past due to intolerance. She will require admission, for observation, IV fluids to treat AKI, and consideration for treatment of lipid elevation.   Nursing Notes Reviewed/ Care Coordinated, and agree without changes. Applicable Imaging Reviewed.  Interpretation of Laboratory Data incorporated into ED treatment  Plan: Admit for bowel rest, and symptomatic treatment.  Daleen Bo, MD 10/10/15 [phone]

## 2015-10-11 DIAGNOSIS — R109 Unspecified abdominal pain: Secondary | ICD-10-CM | POA: Insufficient documentation

## 2015-10-11 DIAGNOSIS — N179 Acute kidney failure, unspecified: Principal | ICD-10-CM

## 2015-10-11 DIAGNOSIS — R1310 Dysphagia, unspecified: Secondary | ICD-10-CM

## 2015-10-11 DIAGNOSIS — K219 Gastro-esophageal reflux disease without esophagitis: Secondary | ICD-10-CM

## 2015-10-11 DIAGNOSIS — R1013 Epigastric pain: Secondary | ICD-10-CM

## 2015-10-11 DIAGNOSIS — K859 Acute pancreatitis without necrosis or infection, unspecified: Secondary | ICD-10-CM

## 2015-10-11 LAB — COMPREHENSIVE METABOLIC PANEL
ALT: 25 U/L (ref 14–54)
ANION GAP: 7 (ref 5–15)
AST: 22 U/L (ref 15–41)
Albumin: 3 g/dL — ABNORMAL LOW (ref 3.5–5.0)
Alkaline Phosphatase: 81 U/L (ref 38–126)
BUN: 31 mg/dL — ABNORMAL HIGH (ref 6–20)
CHLORIDE: 102 mmol/L (ref 101–111)
CO2: 23 mmol/L (ref 22–32)
CREATININE: 1.84 mg/dL — AB (ref 0.44–1.00)
Calcium: 8.5 mg/dL — ABNORMAL LOW (ref 8.9–10.3)
GFR, EST AFRICAN AMERICAN: 34 mL/min — AB (ref >60)
GFR, EST NON AFRICAN AMERICAN: 29 mL/min — AB (ref >60)
Glucose, Bld: 194 mg/dL — ABNORMAL HIGH (ref 65–99)
POTASSIUM: 6.2 mmol/L — AB (ref 3.5–5.1)
SODIUM: 132 mmol/L — AB (ref 135–145)
Total Bilirubin: 0.3 mg/dL (ref 0.3–1.2)
Total Protein: 6.5 g/dL (ref 6.5–8.1)

## 2015-10-11 LAB — CBC
HEMATOCRIT: 37.2 % (ref 36.0–46.0)
HEMOGLOBIN: 11.7 g/dL — AB (ref 12.0–15.0)
MCH: 22.2 pg — AB (ref 26.0–34.0)
MCHC: 31.5 g/dL (ref 30.0–36.0)
MCV: 70.6 fL — AB (ref 78.0–100.0)
PLATELETS: 369 10*3/uL (ref 150–400)
RBC: 5.27 MIL/uL — AB (ref 3.87–5.11)
RDW: 16 % — ABNORMAL HIGH (ref 11.5–15.5)
WBC: 5.6 10*3/uL (ref 4.0–10.5)

## 2015-10-11 LAB — BASIC METABOLIC PANEL
ANION GAP: 9 (ref 5–15)
BUN: 28 mg/dL — AB (ref 6–20)
CO2: 20 mmol/L — AB (ref 22–32)
Calcium: 8.8 mg/dL — ABNORMAL LOW (ref 8.9–10.3)
Chloride: 102 mmol/L (ref 101–111)
Creatinine, Ser: 1.8 mg/dL — ABNORMAL HIGH (ref 0.44–1.00)
GFR calc Af Amer: 34 mL/min — ABNORMAL LOW (ref >60)
GFR calc non Af Amer: 30 mL/min — ABNORMAL LOW (ref >60)
GLUCOSE: 178 mg/dL — AB (ref 65–99)
Potassium: 5.7 mmol/L — ABNORMAL HIGH (ref 3.5–5.1)
Sodium: 131 mmol/L — ABNORMAL LOW (ref 135–145)

## 2015-10-11 LAB — GLUCOSE, CAPILLARY
GLUCOSE-CAPILLARY: 192 mg/dL — AB (ref 65–99)
GLUCOSE-CAPILLARY: 212 mg/dL — AB (ref 65–99)
GLUCOSE-CAPILLARY: 194 mg/dL — AB (ref 65–99)
GLUCOSE-CAPILLARY: 189 mg/dL — AB (ref 65–99)
Glucose-Capillary: 187 mg/dL — ABNORMAL HIGH (ref 65–99)
Glucose-Capillary: 254 mg/dL — ABNORMAL HIGH (ref 65–99)

## 2015-10-11 MED ORDER — ONDANSETRON HCL 4 MG/2ML IJ SOLN
4.0000 mg | Freq: Four times a day (QID) | INTRAMUSCULAR | Status: DC | PRN
  Administered 2015-10-11: 4 mg via INTRAVENOUS
  Filled 2015-10-11: qty 2

## 2015-10-11 MED ORDER — PANTOPRAZOLE SODIUM 40 MG PO TBEC
40.0000 mg | DELAYED_RELEASE_TABLET | Freq: Every day | ORAL | Status: DC
  Administered 2015-10-11: 40 mg via ORAL
  Filled 2015-10-11: qty 1

## 2015-10-11 MED ORDER — LIDOCAINE VISCOUS 2 % MT SOLN
15.0000 mL | Freq: Four times a day (QID) | OROMUCOSAL | Status: DC | PRN
  Administered 2015-10-11: 15 mL via OROMUCOSAL
  Filled 2015-10-11: qty 15

## 2015-10-11 MED ORDER — PANTOPRAZOLE SODIUM 40 MG PO TBEC
40.0000 mg | DELAYED_RELEASE_TABLET | Freq: Two times a day (BID) | ORAL | Status: DC
  Administered 2015-10-11 – 2015-10-16 (×11): 40 mg via ORAL
  Filled 2015-10-11 (×11): qty 1

## 2015-10-11 NOTE — Progress Notes (Signed)
PROGRESS NOTE    Kathryn Beck  [id number] DOB: 07/21/1955 DOA: 10/10/2015 PCP: Tula Nakayama, MD    Brief Narrative:  60 y.o. female with medical history significant of type 2 diabetes mellitus, hypertension, obesity, sleep apnea, hyperlipidemia who initially presented to PCP on 10/09/2015 with complaints of feeling "bad all over" with abdominal cramping, decreased appetite and dehydration. Specifically, patient reported abdominal pain with eating. Patient was thought to have gastroesophageal reflux disease and was given a course of PPI. Symptoms worsened and patient subsequently presented to the emergency department.   Assessment & Plan:   Principal Problem:   Pancreatitis Active Problems:   Diabetes mellitus, insulin dependent (IDDM), uncontrolled (HCC)   Mixed hyperlipidemia   Obesity   Essential hypertension   Acute pancreatitis   ARF (acute renal failure) (HCC)   Abdominal pain, suspected acute pancreatitis -Mildly elevated lipase on admission with upper quadrant abdominal pain. -Patient currently advancing diet with hydration and analgesics as needed. -Pt still with difficulty tolerating diet. Have consulted GI for assistance.  Diabetes mellitus -We'll continue patient on supplemental sliding regimen -Initially continued on lower dose of levemir as pt is NPO  Hyperlipidemia -Seems stable at present  Obesity -Stable  Hypertension -Blood pressure stable at present  Acute renal failure -Patient with creatinine of over 2 on presentation -Continue patient with IV fluids as per above -Cont to monitor renal function closely  HTN -Hold diuretics and nephrotoxic agents for now -Cont on PRN hydralazine   DVT prophylaxis: Heparin subQ Code Status: Full Family Communication: Pt in room Disposition Plan: Uncertain at this time   Consultants:     Procedures:     Antimicrobials:  Anti-infectives    None           Subjective: Was NPO this  AM, was willing to try clears  Objective: Filed Vitals:   10/10/15 1642 10/10/15 1754 10/10/15 1757 10/10/15 2033  BP: 145/80 158/85  140/66  Pulse: 82 91  88  Temp:  98.3 F (36.8 C)  98.9 F (37.2 C)  TempSrc:  Oral  Oral  Resp: 14 16  20   Height:  5\' 6"  (1.676 m)    Weight:   90.357 kg (199 lb 3.2 oz)   SpO2: 100% 99%  100%   No intake or output data in the 24 hours ending 10/11/15 1636 Filed Weights   10/10/15 1242 10/10/15 1757  Weight: 90.266 kg (199 lb) 90.357 kg (199 lb 3.2 oz)    Examination:  General exam: Appears calm and comfortable, laying in bed  Respiratory system: Clear to auscultation. Respiratory effort normal. Cardiovascular system: S1 & S2 heard, RRR. No JVD Gastrointestinal system: Abdomen is nondistended, soft and nontender. No organomegaly or masses felt. Normal bowel sounds heard. Central nervous system: Alert and oriented. No focal neurological deficits. Extremities: Symmetric 5 x 5 power. Skin: No rashes, lesions or ulcers Psychiatry: Judgement and insight appear normal. Mood & affect appropriate.     Data Reviewed: I have personally reviewed following labs and imaging studies  CBC:  Recent Labs Lab 10/10/15 1256 10/11/15 0520  WBC 8.5 5.6  HGB 13.1 11.7*  HCT 40.5 37.2  MCV 69.9* 70.6*  PLT 380 0000000   Basic Metabolic Panel:  Recent Labs Lab 10/07/15 1425 10/10/15 1256 10/11/15 0520 10/11/15 1241  NA 137 133* 132* 131*  K 5.2 5.0 6.2* 5.7*  CL 101 103 102 102  CO2 24 22 23  20*  GLUCOSE 95 101* 194* 178*  BUN 39* 40*  31* 28*  CREATININE 2.14* 2.03* 1.84* 1.80*  CALCIUM 10.3 9.6 8.5* 8.8*   GFR: Estimated Creatinine Clearance: 38.1 mL/min (by C-G formula based on Cr of 1.8). Liver Function Tests:  Recent Labs Lab 10/10/15 1256 10/11/15 0520  AST 23 22  ALT 28 25  ALKPHOS 95 81  BILITOT 0.4 0.3  PROT 7.9 6.5  ALBUMIN 3.7 3.0*    Recent Labs Lab 10/10/15 1256  LIPASE 133*   No results for input(s): AMMONIA in  the last 168 hours. Coagulation Profile: No results for input(s): INR, PROTIME in the last 168 hours. Cardiac Enzymes: No results for input(s): CKTOTAL, CKMB, CKMBINDEX, TROPONINI in the last 168 hours. BNP (last 3 results) No results for input(s): PROBNP in the last 8760 hours. HbA1C: No results for input(s): HGBA1C in the last 72 hours. CBG:  Recent Labs Lab 10/11/15 0201 10/11/15 0609 10/11/15 0805 10/11/15 1111 10/11/15 1622  GLUCAP 187* 192* 212* 194* 189*   Lipid Profile: No results for input(s): CHOL, HDL, LDLCALC, TRIG, CHOLHDL, LDLDIRECT in the last 72 hours. Thyroid Function Tests: No results for input(s): TSH, T4TOTAL, FREET4, T3FREE, THYROIDAB in the last 72 hours. Anemia Panel: No results for input(s): VITAMINB12, FOLATE, FERRITIN, TIBC, IRON, RETICCTPCT in the last 72 hours. Sepsis Labs: No results for input(s): PROCALCITON, LATICACIDVEN in the last 168 hours.  No results found for this or any previous visit (from the past 240 hour(s)).       Radiology Studies: Ct Abdomen Pelvis Wo Contrast  10/10/2015  CLINICAL DATA:  Abdominal cramping with decreased appetite and dehydration. Pain worse with eating. EXAM: CT ABDOMEN AND PELVIS WITHOUT CONTRAST TECHNIQUE: Multidetector CT imaging of the abdomen and pelvis was performed following the standard protocol without IV contrast. COMPARISON:  MRI abdomen 08/23/2008 and CT 09/19/2001 FINDINGS: Lower chest: Lung bases demonstrate scarring over the left base unchanged. 3 mm nodule right lower lobe. Hepatobiliary: No mass visualized on this un-enhanced exam. Pancreas: No mass or inflammatory process identified on this un-enhanced exam. Spleen: Within normal limits in size. 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 Kidneys are normal in size without hydronephrosis or nephrolithiasis. There is a 4.7 cm cyst over the lower pole right kidney. No  nephrolithiasis. Ureters are normal. The bladder is within normal. Stomach/Bowel: No evidence of obstruction, inflammatory process, or abnormal fluid collections. Stomach and small bowel are within normal. Appendix is normal. Colon is within normal. Vascular/Lymphatic: No pathologically enlarged lymph nodes. No evidence of abdominal aortic aneurysm. Very minimal calcified plaque over the abdominal aorta. Reproductive: Previous hysterectomy. Other: None. Musculoskeletal:  Mild degenerate change of the spine and hips. IMPRESSION: No acute findings in the abdomen/pelvis. 4.7 cm right renal cyst. 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 Minimal aortic atherosclerosis. Electronically Signed   By: Marin Olp M.D.   On: 10/10/2015 19:16        Scheduled Meds: . amLODipine  10 mg Oral Daily  . cloNIDine  0.1 mg Oral BID  . docusate sodium  100 mg Oral BID  . gabapentin  100 mg Oral TID  . heparin  5,000 Units Subcutaneous Q8H  . insulin aspart  0-15 Units Subcutaneous Q4H  . insulin detemir  25 Units Subcutaneous QHS  . pantoprazole  40 mg Oral Daily   Continuous Infusions: . sodium chloride 100 mL/hr at 10/10/15 1817     LOS: 1 day     CHIU, STEPHEN K,  MD [facility] Pager [phone]  If 7PM-7AM, please contact night-coverage [email or web address] Password [facility] 10/11/2015, 4:36 PM

## 2015-10-11 NOTE — Progress Notes (Addendum)
Inpatient Diabetes Program Recommendations  AACE/ADA: New Consensus Statement on Inpatient Glycemic Control (2015)  Target Ranges:  Prepandial:   less than 140 mg/dL      Peak postprandial:   less than 180 mg/dL (1-2 hours)      Critically ill patients:  140 - 180 mg/dL  Results for Kathryn Beck, Kathryn Beck (MRN [id number]) as of 10/11/2015 07:21  Ref. Range 10/10/2015 16:41 10/10/2015 20:30 10/11/2015 02:01 10/11/2015 06:09  Glucose-Capillary Latest Ref Range: 65-99 mg/dL 129 (H) 144 (H) 187 (H) 192 (H)   Results for Kathryn Beck, Kathryn Beck (MRN [id number]) as of 10/11/2015 07:21  Ref. Range 10/07/2015 11:35  Hemoglobin A1C Latest Ref Range: <5.7 % 10.2 (H)   Review of Glycemic Control  Diabetes history: DM2 Outpatient Diabetes medications: Levemir 90 units QHS, Humalog 10-25 units BID with meals Current orders for Inpatient glycemic control: Levemir 25 units QHS, Novolog 0-15 units Q4H  Inpatient Diabetes Program Recommendations: Insulin - Basal: Please consider increasing Lantus to 28 units QHS. HgbA1C: A1C 10.2% on 10/07/2015 indicating an average glucose of 246 mg/dl over the past 2-3 months.  Addendum 10/11/15@13 :53-Spoke with patient about diabetes and home regimen for diabetes control. Patient reports that she is followed by Dr. Dorris Fetch for diabetes management and currently she takes Levemir 90 units QHS and Humalog 10-25 units with meals (uses scale based on glucose) as an outpatient for diabetes control. Patient reports that she is taking insulin as prescribed. Patient reports that she has an appointment with Dr. Dorris Fetch on 10/21/15.  Patient states that she is suppose to check her glucose 2-3 times per day but she admits that she is not checking as often as she is suppose to. Patient reports that when she checks her glucose it usually ranges from 150-300 mg/dl.  Inquired about prior A1C and patient reports that her last A1C value was 8% range. Discussed A1C results (10.2% on 10/07/2015) and explained that her  current A1C indicates an average glucose of 246 mg/dl over the past 2-3 months. Discussed glucose and A1C goals. Discussed importance of checking CBGs and maintaining good CBG control to prevent long-term and short-term complications. Stressed to the patient the importance of improving glycemic control to prevent further complications from uncontrolled diabetes. Discussed impact of nutrition, exercise, stress, sickness, and medications on diabetes control. Encouraged patient to check her glucose 3-4 times per day (before meals and at bedtime) and to keep a log book of glucose readings and insulin taken which she will need to take to doctor appointments. Explained how Dr. Dorris Fetch can use the log book to continue to make insulin adjustments if needed. Patient verbalized understanding of information discussed and she states that she has no further questions at this time related to diabetes.  Thanks, Barnie Alderman, RN, MSN, CDE Diabetes Coordinator Inpatient Diabetes Program [phone] (Team Pager from [place] to [place]) [phone] ([place] office) [phone] [facility] office) [phone] [facility] office)

## 2015-10-11 NOTE — Consult Note (Signed)
Referring Provider: [facility] Primary Care Physician:  Tula Nakayama, MD Primary Gastroenterologist:  Dr. Gala Romney  Date of Admission: 10/10/15 Date of Consultation: 10/11/15  Reason for Consultation:  Dysphagia, abdominal pain/nausea unclear etiology  HPI:  Kathryn Beck is a 60 y.o. female with a past medical history of DM, hypertension, anemia, obesity, hyperlipidemia. She presented tot he ED on recommendation from PCP for feeling "bad all over", abdominal cramping, decreased apetite, dehydration. Pain worse with eating, was started on PPI for possible GERD but symptoms worsened and she came to the ER. In the ER noted lipase 133 and triglycerides 711 (unable to tolerate statin due to elevated CCK and LFTs), CBC without leucocytosis or anemia. CMP with low sodium and creatinine 2.03. CT of the abdomen without contrast (due to acute renal failure) showed no acute process and, specifically, no inflammatory process or mass in the pancreas. Her renal status has improved with dehydration (Cr 1.8 today).  No record of colonoscopy or endoscopy in our system.  Today she states she has a history of GERD. Has been on Prevaciv which has typically helped her symptoms. About 2 months ago she began having worsening breakthrough symptoms despite PPI. 2 weeks ago she began having dysphagia-like symptoms. No regurgitation, but has a constand feeling "of something stuck there." In the past week her pain and nausea started. Has bitter rugurgitation when laying flat out night ("It comes out of my mouth and nose, and is very acid/hot"). Esophageal burning, nausea, epigastric and upper abdominal pain as well. All symptoms are worse after eating or drinking, regardless of what it is. All of this is why she has had decreased po intake in the past week. Denies hematochezia, melena, fever, chills. Spoke with hospitalist who was going to d/c her to home and follow-up with [place] outpatient, but she was unable to  tolerate a diet (clear liquids). No other upper or lower GI symptoms.  Past Medical History  Diagnosis Date  . Type 2 diabetes mellitus, uncontrolled ([place])   . Essential hypertension   . Back pain   . Anemia   . Obesity   . Axillary masses     Soft tissue - status post excision  . Sleep apnea     Noncompliant with CPAP  . Mixed hyperlipidemia     Past Surgical History  Procedure Laterality Date  . Abdominal hysterectomy    . Lung biopsy    . Foot surgery Bilateral   . Mass excision Right 01/09/2013    Procedure: EXCISION OF NEOPLASM OF RIGHT  AXILLA  AND EXCISION OF NEOPLASM OF LEFT AXILLA;  Surgeon: Jamesetta So, MD;  Location: [facility];  Service: General;  Laterality: Right;  procedure end @ 08:23    Prior to Admission medications   Medication Sig Start Date End Date Taking? Authorizing Provider  acetaminophen (TYLENOL) 325 MG tablet Take 650 mg by mouth every 6 (six) hours as needed for mild pain.   Yes Historical Provider, MD  allopurinol (ZYLOPRIM) 300 MG tablet TAKE 1 TABLET (300 MG TOTAL) BY MOUTH DAILY. 05/13/15  Yes Fayrene Helper, MD  amLODipine (NORVASC) 10 MG tablet Take 1 tablet by mouth  daily 10/07/15  Yes Fayrene Helper, MD  azelastine (ASTELIN) 0.1 % nasal spray Place 2 sprays into both nostrils 2 (two) times daily. Use in each nostril as directed 07/25/15  Yes Fayrene Helper, MD  cloNIDine (CATAPRES) 0.1 MG tablet Take 1 tablet by mouth  every evening at 9pm for  blood pressure 10/07/15  Yes Fayrene Helper, MD  cyclobenzaprine (FLEXERIL) 10 MG tablet Take 1 tablet by mouth  every night at bedtime as  needed for muscle spasm(s) 08/13/15  Yes Fayrene Helper, MD  ergocalciferol (VITAMIN D2) 50000 UNITS capsule Take 1 capsule (50,000 Units total) by mouth once a week. One capsule once weekly 10/02/14  Yes Fayrene Helper, MD  gabapentin (NEURONTIN) 100 MG capsule Take 1 capsule (100 mg total) by mouth 3 (three) times daily. 03/12/15  Yes Fayrene Helper, MD  Insulin Detemir (LEVEMIR FLEXTOUCH) 100 UNIT/ML Pen INJECT 90 UNITS INTO THE SKIN DAILY AT 10 PM. 08/30/15  Yes Fayrene Helper, MD  insulin lispro (HUMALOG) 100 UNIT/ML injection Inject 10-25 Units into the skin See admin instructions. Twice daily as needed before meals. Patient uses according to sliding scale at home   Yes Historical Provider, MD  INVOKANA 100 MG TABS tablet TAKE 1 TABLET EVERY DAY 04/08/15  Yes Fayrene Helper, MD  omeprazole (PRILOSEC) 40 MG capsule Take 1 capsule (40 mg total) by mouth daily. 10/09/15  Yes Fayrene Helper, MD  ranitidine (ZANTAC) 150 MG tablet Take 1 tablet (150 mg total) by mouth 2 (two) times daily. 07/25/15  Yes Fayrene Helper, MD  spironolactone (ALDACTONE) 25 MG tablet Take 1 tablet by mouth two  times daily 10/07/15  Yes Fayrene Helper, MD  triamterene-hydrochlorothiazide [facility]) 75-50 MG tablet Take 1 tablet by mouth  daily 10/07/15  Yes Fayrene Helper, MD  valsartan (DIOVAN) 320 MG tablet Take 1 tablet by mouth  daily 01/28/15  Yes Fayrene Helper, MD  vitamin B-12 (CYANOCOBALAMIN) 1000 MCG tablet Take 1,000 mcg by mouth daily.   Yes Historical Provider, MD  ACCU-CHEK FASTCLIX LANCETS MISC Use as instructed three times daily dx E11.65 08/20/15   Fayrene Helper, MD  Blood Glucose Monitoring Suppl [facility] VERIO IQ SYSTEM) w/Device KIT Three times daily testing dx E11.65 05/14/15   Fayrene Helper, MD  fluticasone [facility]) 50 MCG/ACT nasal spray Place 2 sprays into both nostrils daily. Patient not taking: Reported on 10/10/2015 07/31/13   Fayrene Helper, MD  glucose blood test strip One touch verio strips, three times daily testing 05/14/15   Fayrene Helper, MD  HYDROcodone-acetaminophen (NORCO/VICODIN) 5-325 MG tablet One tablet at bedtime, as needed, for uncontrolled back pain Thirty to last 3 months Patient not taking: Reported on 10/10/2015 05/17/15   Fayrene Helper, MD  Insulin Pen Needle (B-D UF III MINI  PEN NEEDLES) 31G X 5 MM MISC Use as instructed three times daily 08/20/15   Fayrene Helper, MD  montelukast (SINGULAIR) 10 MG tablet TAKE 1 TABLET (10 MG TOTAL) BY MOUTH AT BEDTIME. Patient not taking: Reported on 10/10/2015 06/28/15   Fayrene Helper, MD  montelukast (SINGULAIR) 10 MG tablet Take 1 tablet by mouth at  bedtime Patient not taking: Reported on 10/10/2015 08/13/15   Fayrene Helper, MD    Current Facility-Administered Medications  Medication Dose Route Frequency Provider Last Rate Last Dose  . 0.9 %  sodium chloride infusion   Intravenous Continuous Donne Hazel, MD 100 mL/hr at 10/10/15 1817    . amLODipine (NORVASC) tablet 10 mg  10 mg Oral Daily Donne Hazel, MD   10 mg at 10/11/15 0912  . bisacodyl (DULCOLAX) suppository 10 mg  10 mg Rectal Daily PRN Donne Hazel, MD      . cloNIDine (CATAPRES) tablet 0.1 mg  0.1 mg Oral BID Donne Hazel, MD   0.1 mg at 10/11/15 0912  . docusate sodium (COLACE) capsule 100 mg  100 mg Oral BID Donne Hazel, MD   100 mg at 10/11/15 0912  . gabapentin (NEURONTIN) capsule 100 mg  100 mg Oral TID Donne Hazel, MD   100 mg at 10/11/15 0912  . heparin injection 5,000 Units  5,000 Units Subcutaneous Q8H Donne Hazel, MD   5,000 Units at 10/11/15 1347  . hydrALAZINE (APRESOLINE) injection 10 mg  10 mg Intravenous Q4H PRN Donne Hazel, MD      . insulin aspart (novoLOG) injection 0-15 Units  0-15 Units Subcutaneous Q4H Donne Hazel, MD   3 Units at 10/11/15 1205  . insulin detemir (LEVEMIR) injection 25 Units  25 Units Subcutaneous QHS Donne Hazel, MD   25 Units at 10/10/15 2233  . morphine 2 MG/ML injection 2 mg  2 mg Intravenous Q4H PRN Donne Hazel, MD   2 mg at 10/11/15 1347  . ondansetron (ZOFRAN) injection 4 mg  4 mg Intravenous Q6H PRN Donne Hazel, MD   4 mg at 10/11/15 1347  . pantoprazole (PROTONIX) EC tablet 40 mg  40 mg Oral Daily Donne Hazel, MD   40 mg at 10/11/15 1205    Allergies as of 10/10/2015 -  Review Complete 10/10/2015  Allergen Reaction Noted  . Ace inhibitors Anaphylaxis and Swelling 06/07/2007  . Penicillins Itching and Swelling 06/07/2007  . Statins Other (See Comments) 12/24/2012    History reviewed. No pertinent family history.  Social History   Social History  . Marital Status: Married    Spouse Name: N/A  . Number of Children: N/A  . Years of Education: N/A   Occupational History  . Not on file.   Social History Main Topics  . Smoking status: Never Smoker   . Smokeless tobacco: Not on file  . Alcohol Use: No  . Drug Use: No  . Sexual Activity: Not on file   Other Topics Concern  . Not on file   Social History Narrative    Review of Systems: General: Negative for anorexia, weight loss, fever, chills. Eyes: Negative for vision changes.  ENT: Negative for hoarseness, difficulty swallowing, nasal congestion. CV: Negative for chest pain, angina, palpitations, peripheral edema.  Respiratory: Negative for dyspnea at rest, cough, sputum, wheezing.  GI: See history of present illness. Endo: Negative for unusual weight change.  Heme: Negative for bruising or bleeding.  Physical Exam: Vital signs in last 24 hours: Temp:  [98.3 F (36.8 C)-98.9 F (37.2 C)] 98.9 F (37.2 C) (07/20 2033) Pulse Rate:  [82-91] 88 (07/20 2033) Resp:  [14-20] 20 (07/20 2033) BP: (140-158)/(66-85) 140/66 mmHg (07/20 2033) SpO2:  [98 %-100 %] 100 % (07/20 2033) Weight:  [199 lb 3.2 oz (90.357 kg)] 199 lb 3.2 oz (90.357 kg) (07/20 1757) Last BM Date: 10/10/15 General:   Alert,  Well-developed, well-nourished, pleasant and cooperative in NAD Head:  Normocephalic and atraumatic. Eyes:  Sclera clear, no icterus. Conjunctiva pink. Ears:  Normal auditory acuity. Neck:  Supple; no masses or thyromegaly. Lungs:  Clear throughout to auscultation.   No wheezes, crackles, or rhonchi. No acute distress. Heart:  Regular rate and rhythm; no murmurs, clicks, rubs,  or gallops. Abdomen:   Abdomen rounded but soft, and nondistended. Mild to moderate epigastric and upper abdominal TTP. No masses, hepatosplenomegaly or hernias noted. Normal bowel sounds, without guarding, and without rebound.  Rectal:  Deferred.   Msk:  Symmetrical without gross deformities. Normal posture. Pulses:  Normal bilateral DP pulses noted. Extremities:  Mild bilateral LE non-pitting edema (at baseline per patient). Neurologic:  Alert and  oriented x4; grossly normal neurologically. Skin:  Intact without significant lesions or rashes. Psych:  Alert and cooperative. Normal mood and affect.  Intake/Output from previous day:   Intake/Output this shift:    Lab Results:  Recent Labs  10/10/15 1256 10/11/15 0520  WBC 8.5 5.6  HGB 13.1 11.7*  HCT 40.5 37.2  PLT 380 369   BMET  Recent Labs  10/10/15 1256 10/11/15 0520 10/11/15 1241  NA 133* 132* 131*  K 5.0 6.2* 5.7*  CL 103 102 102  CO2 22 23 20*  GLUCOSE 101* 194* 178*  BUN 40* 31* 28*  CREATININE 2.03* 1.84* 1.80*  CALCIUM 9.6 8.5* 8.8*   LFT  Recent Labs  10/10/15 1256 10/11/15 0520  PROT 7.9 6.5  ALBUMIN 3.7 3.0*  AST 23 22  ALT 28 25  ALKPHOS 95 81  BILITOT 0.4 0.3   PT/INR No results for input(s): LABPROT, INR in the last 72 hours. Hepatitis Panel No results for input(s): HEPBSAG, HCVAB, HEPAIGM, HEPBIGM in the last 72 hours. C-Diff No results for input(s): CDIFFTOX in the last 72 hours.  Studies/Results: Ct Abdomen Pelvis Wo Contrast  10/10/2015  CLINICAL DATA:  Abdominal cramping with decreased appetite and dehydration. Pain worse with eating. EXAM: CT ABDOMEN AND PELVIS WITHOUT CONTRAST TECHNIQUE: Multidetector CT imaging of the abdomen and pelvis was performed following the standard protocol without IV contrast. COMPARISON:  MRI abdomen 08/23/2008 and CT 09/19/2001 FINDINGS: Lower chest: Lung bases demonstrate scarring over the left base unchanged. 3 mm nodule right lower lobe. Hepatobiliary: No mass visualized  on this un-enhanced exam. Pancreas: No mass or inflammatory process identified on this un-enhanced exam. Spleen: Within normal limits in size. 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 Kidneys are normal in size without hydronephrosis or nephrolithiasis. There is a 4.7 cm cyst over the lower pole right kidney. No nephrolithiasis. Ureters are normal. The bladder is within normal. Stomach/Bowel: No evidence of obstruction, inflammatory process, or abnormal fluid collections. Stomach and small bowel are within normal. Appendix is normal. Colon is within normal. Vascular/Lymphatic: No pathologically enlarged lymph nodes. No evidence of abdominal aortic aneurysm. Very minimal calcified plaque over the abdominal aorta. Reproductive: Previous hysterectomy. Other: None. Musculoskeletal:  Mild degenerate change of the spine and hips. IMPRESSION: No acute findings in the abdomen/pelvis. 4.7 cm right renal cyst. 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 Minimal aortic atherosclerosis. Electronically Signed   By: Marin Olp M.D.   On: 10/10/2015 19:16    Impression: 60 year old female admitted with acute pancreatitis with elevated lipase 133, no signs of pancreatitis on CT imaging. Describes poorly controlled GERD with worsening symptoms over the past couple months, new onset dysphagia about 2 weeks ago without regurgitation, and severe dyspepsia symptoms in the past week which has caused her to avoid adequate po intake due to pain and other symptoms. Prevacid as outpatient previously worked, per home med list patient is now on omeprazole which doesn't seem to have helped. As inpatient she has been on Protonix 40 mg daily. Failed diet trial today.  Her presentation does not seem consistent with pancreatitis. Likely poorly controlled GERD underlying. Differentials for acute symptoms include GERD exacerbation, esophagitis,  gastritis, PUD, H.  Pylori, duodenitis, duodenal ulcer. Given her other symptoms, dysphagia likely esophagitis in nature, although cannot rule out stricture, web, or ring. What she has eaten passes and she has had bowel movements (last one last night) so low threshold to suspect esophageal obstruction.  Plan: 1. Increase Protonix to 40 mg bid 2. Add viscous lidocaine 15 mL to swallow for symptomatic improvement 3. Will ultimately need EGD, likely as outpatient unless she fails to improve and/or has sudden change in condition. 4. Supportive measures 5. Continue to attempt diet advancement as she improves.   Walden Field, AGNP-C Adult & Gerontological Nurse Practitioner [facility]    LOS: 1 day     10/11/2015, 3:59 PM

## 2015-10-12 DIAGNOSIS — R1010 Upper abdominal pain, unspecified: Secondary | ICD-10-CM

## 2015-10-12 DIAGNOSIS — R1310 Dysphagia, unspecified: Secondary | ICD-10-CM

## 2015-10-12 DIAGNOSIS — R1013 Epigastric pain: Secondary | ICD-10-CM

## 2015-10-12 DIAGNOSIS — R110 Nausea: Secondary | ICD-10-CM

## 2015-10-12 DIAGNOSIS — K2970 Gastritis, unspecified, without bleeding: Secondary | ICD-10-CM

## 2015-10-12 LAB — AMYLASE: AMYLASE: 66 U/L (ref 28–100)

## 2015-10-12 LAB — CBC
HCT: 36.5 % (ref 36.0–46.0)
Hemoglobin: 11.4 g/dL — ABNORMAL LOW (ref 12.0–15.0)
MCH: 22.2 pg — ABNORMAL LOW (ref 26.0–34.0)
MCHC: 31.2 g/dL (ref 30.0–36.0)
MCV: 71.2 fL — ABNORMAL LOW (ref 78.0–100.0)
PLATELETS: 358 10*3/uL (ref 150–400)
RBC: 5.13 MIL/uL — AB (ref 3.87–5.11)
RDW: 15.9 % — ABNORMAL HIGH (ref 11.5–15.5)
WBC: 5.1 10*3/uL (ref 4.0–10.5)

## 2015-10-12 LAB — BASIC METABOLIC PANEL
ANION GAP: 8 (ref 5–15)
BUN: 25 mg/dL — ABNORMAL HIGH (ref 6–20)
CALCIUM: 8.5 mg/dL — AB (ref 8.9–10.3)
CO2: 22 mmol/L (ref 22–32)
CREATININE: 1.77 mg/dL — AB (ref 0.44–1.00)
Chloride: 104 mmol/L (ref 101–111)
GFR calc non Af Amer: 30 mL/min — ABNORMAL LOW (ref >60)
GFR, EST AFRICAN AMERICAN: 35 mL/min — AB (ref >60)
Glucose, Bld: 201 mg/dL — ABNORMAL HIGH (ref 65–99)
Potassium: 5 mmol/L (ref 3.5–5.1)
SODIUM: 134 mmol/L — AB (ref 135–145)

## 2015-10-12 LAB — GLUCOSE, CAPILLARY
GLUCOSE-CAPILLARY: 261 mg/dL — AB (ref 65–99)
GLUCOSE-CAPILLARY: 197 mg/dL — AB (ref 65–99)
GLUCOSE-CAPILLARY: 251 mg/dL — AB (ref 65–99)
GLUCOSE-CAPILLARY: 174 mg/dL — AB (ref 65–99)
GLUCOSE-CAPILLARY: 339 mg/dL — AB (ref 65–99)

## 2015-10-12 LAB — MAGNESIUM: MAGNESIUM: 1.9 mg/dL (ref 1.7–2.4)

## 2015-10-12 LAB — LIPASE, BLOOD: LIPASE: 91 U/L — AB (ref 11–51)

## 2015-10-12 MED ORDER — INSULIN ASPART 100 UNIT/ML ~~LOC~~ SOLN
0.0000 [IU] | Freq: Every day | SUBCUTANEOUS | Status: DC
  Administered 2015-10-12: 4 [IU] via SUBCUTANEOUS
  Administered 2015-10-13 – 2015-10-14 (×2): 3 [IU] via SUBCUTANEOUS
  Administered 2015-10-15: 4 [IU] via SUBCUTANEOUS

## 2015-10-12 MED ORDER — INSULIN ASPART 100 UNIT/ML ~~LOC~~ SOLN
0.0000 [IU] | Freq: Three times a day (TID) | SUBCUTANEOUS | Status: DC
  Administered 2015-10-12: 3 [IU] via SUBCUTANEOUS
  Administered 2015-10-12: 8 [IU] via SUBCUTANEOUS
  Administered 2015-10-13: 3 [IU] via SUBCUTANEOUS
  Administered 2015-10-13: 8 [IU] via SUBCUTANEOUS
  Administered 2015-10-13: 5 [IU] via SUBCUTANEOUS
  Administered 2015-10-14: 8 [IU] via SUBCUTANEOUS
  Administered 2015-10-14: 5 [IU] via SUBCUTANEOUS
  Administered 2015-10-14: 8 [IU] via SUBCUTANEOUS
  Administered 2015-10-15: 2 [IU] via SUBCUTANEOUS
  Administered 2015-10-15: 3 [IU] via SUBCUTANEOUS
  Administered 2015-10-15: 8 [IU] via SUBCUTANEOUS
  Administered 2015-10-16: 2 [IU] via SUBCUTANEOUS
  Administered 2015-10-16: 8 [IU] via SUBCUTANEOUS

## 2015-10-12 MED ORDER — HEPARIN SODIUM (PORCINE) 5000 UNIT/ML IJ SOLN
5000.0000 [IU] | Freq: Three times a day (TID) | INTRAMUSCULAR | Status: AC
  Administered 2015-10-12: 5000 [IU] via SUBCUTANEOUS
  Filled 2015-10-12: qty 1

## 2015-10-12 MED ORDER — METOCLOPRAMIDE HCL 5 MG/ML IJ SOLN
10.0000 mg | Freq: Four times a day (QID) | INTRAMUSCULAR | Status: AC
Start: 2015-10-12 — End: 2015-10-13
  Administered 2015-10-12 – 2015-10-13 (×4): 10 mg via INTRAVENOUS
  Filled 2015-10-12 (×4): qty 2

## 2015-10-12 NOTE — Progress Notes (Signed)
PROGRESS NOTE    Kathryn Beck  [id number] DOB: [birth year] DOA: 10/10/2015 PCP: Tula Nakayama, MD    Brief Narrative:  60 y.o. female with medical history significant of type 2 diabetes mellitus, hypertension, obesity, sleep apnea, hyperlipidemia who initially presented to PCP on 10/09/2015 with complaints of feeling "bad all over" with abdominal cramping, decreased appetite and dehydration. Specifically, patient reported abdominal pain with eating. Patient was thought to have gastroesophageal reflux disease and was given a course of PPI. Symptoms worsened and patient subsequently presented to the emergency department.   Assessment & Plan:   Principal Problem:   Pancreatitis Active Problems:   Diabetes mellitus, insulin dependent (IDDM), uncontrolled ([place])   Mixed hyperlipidemia   Obesity   Essential hypertension   Acute pancreatitis   ARF (acute renal failure) ([facility])   Abdominal pain   AKI (acute kidney injury) ([place])   Esophageal reflux   Dysphagia   Abdominal pain, suspected acute pancreatitis -Mildly elevated lipase on admission with upper quadrant abdominal pain. -Patient currently advancing diet with hydration and analgesics as needed. -Pt still with difficulty tolerating diet. Appreciate input by GI -Suspicion for gastroparesis versus peptic ulcer disease. Plans for Reglan scheduled and EGD tomorrow morning.  Diabetes mellitus -We'll continue patient on supplemental sliding regimen -Initially continued on lower dose of levemir as pt is with limited by mouth intake  Hyperlipidemia -Seems stable at present  Obesity -Stable  Hypertension -Blood pressure stable at present  Acute renal failure -Patient with creatinine of over 2 on presentation -Continue patient with IV fluids as per above -Renal function has shown improvement with hydration  HTN -Hold diuretics and nephrotoxic agents for now -Cont on PRN hydralazine   DVT prophylaxis: Heparin  subQ Code Status: Full Family Communication: Pt in room Disposition Plan: Uncertain at this time   Consultants:   Gastroenterology  Procedures:     Antimicrobials:  Anti-infectives    None          Subjective: Reports tolerating clears, still feeling nauseated though and still feels as if "something stuck in my throat"  Objective: Filed Vitals:   10/10/15 2033 10/11/15 2036 10/12/15 0619 10/12/15 1330  BP: 140/66 131/78 124/71 128/78  Pulse: 88 72 69 66  Temp: 98.9 F (37.2 C) 98.3 F (36.8 C) 98.2 F (36.8 C) 97.6 F (36.4 C)  TempSrc: Oral Oral Oral Oral  Resp: 20 20 20 20   Height:      Weight:      SpO2: 100% 98% 98% 100%    Intake/Output Summary (Last 24 hours) at 10/12/15 1637 Last data filed at 10/12/15 1157  Gross per 24 hour  Intake    360 ml  Output      0 ml  Net    360 ml   Filed Weights   10/10/15 1242 10/10/15 1757  Weight: 90.266 kg (199 lb) 90.357 kg (199 lb 3.2 oz)    Examination:  General exam: Appears calm and comfortable, Sitting in bed  Respiratory system: Clear to auscultation. Respiratory effort normal. Cardiovascular system: S1 & S2 heard, RRR. No JVD Gastrointestinal system: Abdomen is nondistended, soft and nontender. No organomegaly or masses felt. Normal bowel sounds heard. Central nervous system: Alert and oriented. No focal neurological deficits. Extremities: Symmetric 5 x 5 power. Skin: No rashes, lesions Psychiatry: Judgement and insight appear normal. Mood & affect appropriate.     Data Reviewed: I have personally reviewed following labs and imaging studies  CBC:  Recent Labs Lab 10/10/15 1256  10/11/15 0520 10/12/15 0500  WBC 8.5 5.6 5.1  HGB 13.1 11.7* 11.4*  HCT 40.5 37.2 36.5  MCV 69.9* 70.6* 71.2*  PLT 380 369 [id number]   Basic Metabolic Panel:  Recent Labs Lab 10/07/15 1425 10/10/15 1256 10/11/15 0520 10/11/15 1241 10/12/15 0515  NA 137 133* 132* 131* 134*  K 5.2 5.0 6.2* 5.7* 5.0  CL 101 103  102 102 104  CO2 24 22 23  20* 22  GLUCOSE 95 101* 194* 178* 201*  BUN 39* 40* 31* 28* 25*  CREATININE 2.14* 2.03* 1.84* 1.80* 1.77*  CALCIUM 10.3 9.6 8.5* 8.8* 8.5*  MG  --   --   --   --  1.9   GFR: Estimated Creatinine Clearance: 38.7 mL/min (by C-G formula based on Cr of 1.77). Liver Function Tests:  Recent Labs Lab 10/10/15 1256 10/11/15 0520  AST 23 22  ALT 28 25  ALKPHOS 95 81  BILITOT 0.4 0.3  PROT 7.9 6.5  ALBUMIN 3.7 3.0*    Recent Labs Lab 10/10/15 1256  LIPASE 133*   No results for input(s): AMMONIA in the last 168 hours. Coagulation Profile: No results for input(s): INR, PROTIME in the last 168 hours. Cardiac Enzymes: No results for input(s): CKTOTAL, CKMB, CKMBINDEX, TROPONINI in the last 168 hours. BNP (last 3 results) No results for input(s): PROBNP in the last 8760 hours. HbA1C: No results for input(s): HGBA1C in the last 72 hours. CBG:  Recent Labs Lab 10/11/15 1622 10/11/15 2033 10/12/15 0122 10/12/15 0745 10/12/15 1125  GLUCAP 189* 254* 261* 197* 251*   Lipid Profile: No results for input(s): CHOL, HDL, LDLCALC, TRIG, CHOLHDL, LDLDIRECT in the last 72 hours. Thyroid Function Tests: No results for input(s): TSH, T4TOTAL, FREET4, T3FREE, THYROIDAB in the last 72 hours. Anemia Panel: No results for input(s): VITAMINB12, FOLATE, FERRITIN, TIBC, IRON, RETICCTPCT in the last 72 hours. Sepsis Labs: No results for input(s): PROCALCITON, LATICACIDVEN in the last 168 hours.  No results found for this or any previous visit (from the past 240 hour(s)).       Radiology Studies: Ct Abdomen Pelvis Wo Contrast  10/10/2015  CLINICAL DATA:  Abdominal cramping with decreased appetite and dehydration. Pain worse with eating. EXAM: CT ABDOMEN AND PELVIS WITHOUT CONTRAST TECHNIQUE: Multidetector CT imaging of the abdomen and pelvis was performed following the standard protocol without IV contrast. COMPARISON:  MRI abdomen 08/23/2008 and CT 09/19/2001  FINDINGS: Lower chest: Lung bases demonstrate scarring over the left base unchanged. 3 mm nodule right lower lobe. Hepatobiliary: No mass visualized on this un-enhanced exam. Pancreas: No mass or inflammatory process identified on this un-enhanced exam. Spleen: Within normal limits in size. 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 Kidneys are normal in size without hydronephrosis or nephrolithiasis. There is a 4.7 cm cyst over the lower pole right kidney. No nephrolithiasis. Ureters are normal. The bladder is within normal. Stomach/Bowel: No evidence of obstruction, inflammatory process, or abnormal fluid collections. Stomach and small bowel are within normal. Appendix is normal. Colon is within normal. Vascular/Lymphatic: No pathologically enlarged lymph nodes. No evidence of abdominal aortic aneurysm. Very minimal calcified plaque over the abdominal aorta. Reproductive: Previous hysterectomy. Other: None. Musculoskeletal:  Mild degenerate change of the spine and hips. IMPRESSION: No acute findings in the abdomen/pelvis. 4.7 cm right renal cyst. 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 Minimal aortic atherosclerosis. Electronically Signed   By: Marin Olp M.D.  On: 10/10/2015 19:16        Scheduled Meds: . amLODipine  10 mg Oral Daily  . cloNIDine  0.1 mg Oral BID  . docusate sodium  100 mg Oral BID  . gabapentin  100 mg Oral TID  . heparin  5,000 Units Subcutaneous Q8H  . insulin aspart  0-15 Units Subcutaneous TID WC  . insulin aspart  0-5 Units Subcutaneous QHS  . insulin detemir  25 Units Subcutaneous QHS  . metoCLOPramide (REGLAN) injection  10 mg Intravenous Q6H  . pantoprazole  40 mg Oral BID AC   Continuous Infusions: . sodium chloride 100 mL/hr at 10/12/15 1208     LOS: 2 days     Fields Oros, Orpah Melter, MD [facility] Pager [phone]  If 7PM-7AM, please contact  night-coverage [email or web address] Password [facility] 10/12/2015, 4:37 PM

## 2015-10-12 NOTE — Progress Notes (Signed)
  Subjective: Patient continues complain of epigastric pain nausea and dysphagia. She says heartburns not well controlled with therapy. Rebaprazole is not working anymore. She denies melena or rectal bleeding.    Objective: Blood pressure 128/78, pulse 66, temperature 97.6 F (36.4 C), temperature source Oral, resp. rate 20, height 5\' 6"  (1.676 m), weight 199 lb 3.2 oz (90.357 kg), SpO2 100 %. Patient is alert and in no acute distress. Abdomen is full. Bowel sounds are normal. She has mild tenderness across upper abdomen. No LE edema or clubbing noted.  Labs/studies Results:   Recent Labs  10/10/15 1256 10/11/15 0520 10/12/15 0500  WBC 8.5 5.6 5.1  HGB 13.1 11.7* 11.4*  HCT 40.5 37.2 36.5  PLT 380 369 358    BMET   Recent Labs  10/11/15 0520 10/11/15 1241 10/12/15 0515  NA 132* 131* 134*  K 6.2* 5.7* 5.0  CL 102 102 104  CO2 23 20* 22  GLUCOSE 194* 178* 201*  BUN 31* 28* 25*  CREATININE 1.84* 1.80* 1.77*  CALCIUM 8.5* 8.8* 8.5*    LFT   Recent Labs  10/10/15 1256 10/11/15 0520  PROT 7.9 6.5  ALBUMIN 3.7 3.0*  AST 23 22  ALT 28 25  ALKPHOS 95 81  BILITOT 0.4 0.3      Assessment:  #1. Dyspeptic symptoms most likely secondary to gastroparesis but need to rule out peptic ulcer disease. Mildly elevated serum lipase felt to be nonspecific. Pancreas appears normal and unenhanced CT. She has been evaluated twice in the last 9 years with ultrasound and has been negative for cholelithiasis and HIDA scan 8 years ago revealed EF of 42%. #2. Solid food dysphagia. She could have esophageal ring or stricture  Recommendations  Metoclopramide 10 mg IV every 6 hours for 4 doses. Serum amylase and lipase in a.m. esophagogastroduodenoscopy with esophageal dilation tomorrow morning. Will hold off heparin after next dose tonight.

## 2015-10-13 ENCOUNTER — Encounter (HOSPITAL_COMMUNITY)
Admission: EM | Disposition: A | Discharge status: Home / Self Care | Payer: Self-pay | Source: Home / Self Care | Attending: Internal Medicine

## 2015-10-13 ENCOUNTER — Other Ambulatory Visit: Payer: Self-pay | Admitting: Family Medicine

## 2015-10-13 HISTORY — PX: ESOPHAGEAL DILATION: SHX303

## 2015-10-13 HISTORY — PX: ESOPHAGOGASTRODUODENOSCOPY: SHX5428

## 2015-10-13 LAB — COMPREHENSIVE METABOLIC PANEL
ALBUMIN: 2.9 g/dL — AB (ref 3.5–5.0)
ALK PHOS: 80 U/L (ref 38–126)
ALT: 25 U/L (ref 14–54)
ANION GAP: 7 (ref 5–15)
AST: 19 U/L (ref 15–41)
BILIRUBIN TOTAL: 0.3 mg/dL (ref 0.3–1.2)
BUN: 27 mg/dL — ABNORMAL HIGH (ref 6–20)
CALCIUM: 8.6 mg/dL — AB (ref 8.9–10.3)
CO2: 22 mmol/L (ref 22–32)
Chloride: 106 mmol/L (ref 101–111)
Creatinine, Ser: 1.79 mg/dL — ABNORMAL HIGH (ref 0.44–1.00)
GFR, EST AFRICAN AMERICAN: 35 mL/min — AB (ref >60)
GFR, EST NON AFRICAN AMERICAN: 30 mL/min — AB (ref >60)
GLUCOSE: 219 mg/dL — AB (ref 65–99)
POTASSIUM: 4.6 mmol/L (ref 3.5–5.1)
Sodium: 135 mmol/L (ref 135–145)
TOTAL PROTEIN: 6.4 g/dL — AB (ref 6.5–8.1)

## 2015-10-13 LAB — GLUCOSE, CAPILLARY
GLUCOSE-CAPILLARY: 220 mg/dL — AB (ref 65–99)
GLUCOSE-CAPILLARY: 190 mg/dL — AB (ref 65–99)
GLUCOSE-CAPILLARY: 289 mg/dL — AB (ref 65–99)
Glucose-Capillary: 289 mg/dL — ABNORMAL HIGH (ref 65–99)

## 2015-10-13 LAB — AMYLASE: AMYLASE: 71 U/L (ref 28–100)

## 2015-10-13 LAB — LIPASE, BLOOD: Lipase: 119 U/L — ABNORMAL HIGH (ref 11–51)

## 2015-10-13 SURGERY — EGD (ESOPHAGOGASTRODUODENOSCOPY)
Anesthesia: Moderate Sedation

## 2015-10-13 MED ORDER — ALLOPURINOL 300 MG PO TABS
300.0000 mg | ORAL_TABLET | Freq: Every day | ORAL | Status: DC
  Administered 2015-10-13 – 2015-10-16 (×4): 300 mg via ORAL
  Filled 2015-10-13 (×4): qty 1

## 2015-10-13 MED ORDER — MIDAZOLAM HCL 5 MG/5ML IJ SOLN
INTRAMUSCULAR | Status: DC | PRN
  Administered 2015-10-13: 3 mg via INTRAVENOUS
  Administered 2015-10-13: 1 mg via INTRAVENOUS

## 2015-10-13 MED ORDER — INSULIN DETEMIR 100 UNIT/ML ~~LOC~~ SOLN
35.0000 [IU] | Freq: Every day | SUBCUTANEOUS | Status: DC
  Administered 2015-10-13: 35 [IU] via SUBCUTANEOUS
  Filled 2015-10-13 (×2): qty 0.35

## 2015-10-13 MED ORDER — LIDOCAINE VISCOUS 2 % MT SOLN
OROMUCOSAL | Status: AC
  Filled 2015-10-13: qty 15

## 2015-10-13 MED ORDER — ACETAMINOPHEN 325 MG PO TABS
650.0000 mg | ORAL_TABLET | Freq: Four times a day (QID) | ORAL | Status: DC | PRN
  Administered 2015-10-13 – 2015-10-16 (×2): 650 mg via ORAL
  Filled 2015-10-13 (×2): qty 2

## 2015-10-13 MED ORDER — MIDAZOLAM HCL 5 MG/5ML IJ SOLN
INTRAMUSCULAR | Status: AC
  Filled 2015-10-13: qty 10

## 2015-10-13 MED ORDER — MEPERIDINE HCL 50 MG/ML IJ SOLN
INTRAMUSCULAR | Status: AC
  Filled 2015-10-13: qty 1

## 2015-10-13 MED ORDER — MEPERIDINE HCL 50 MG/ML IJ SOLN
INTRAMUSCULAR | Status: DC | PRN
  Administered 2015-10-13 (×2): 25 mg via INTRAVENOUS

## 2015-10-13 MED ORDER — BUTAMBEN-TETRACAINE-BENZOCAINE 2-2-14 % EX AERO
INHALATION_SPRAY | CUTANEOUS | Status: DC | PRN
  Administered 2015-10-13: 1 via TOPICAL

## 2015-10-13 NOTE — Op Note (Signed)
[facility] Patient Name: Kathryn Beck Procedure Date: 10/13/2015 8:41 AM MRN: [id number] Date of Birth: 1956/03/14 Attending MD: Hildred Laser , MD CSN: [id number] Age: 60 Admit Type: Ambulatory Procedure:                Upper GI endoscopy Indications:              Upper abdominal pain, Esophageal dysphagia, Nausea Providers:                Hildred Laser, MD, Otis Peak B. Sharon Seller, RN, Isabella Stalling, Technician Referring MD:             Marylu Lund, MD Medicines:                Cetacaine spray, Meperidine 50 mg IV, Midazolam 4                            mg IV Complications:            No immediate complications. Estimated Blood Loss:     Estimated blood loss was minimal. Procedure:                Pre-Anesthesia Assessment:                           - Prior to the procedure, a History and Physical                            was performed, and patient medications and                            allergies were reviewed. The patient's tolerance of                            previous anesthesia was also reviewed. The risks                            and benefits of the procedure and the sedation                            options and risks were discussed with the patient.                            All questions were answered, and informed consent                            was obtained. Prior Anticoagulants: The patient                            last took heparin 1 day prior to the procedure. ASA                            Grade Assessment: III - A patient with severe  systemic disease. After reviewing the risks and                            benefits, the patient was deemed in satisfactory                            condition to undergo the procedure.                           After obtaining informed consent, the endoscope was                            passed under direct vision. Throughout the   procedure, the patient's blood pressure, pulse, and                            oxygen saturations were monitored continuously. The                            [id number] scope was introduced through the                            mouth, and advanced to the second part of duodenum.                            The upper GI endoscopy was accomplished without                            difficulty. The patient tolerated the procedure                            well. Scope In: 9:27:33 AM Scope Out: 9:38:43 AM Total Procedure Duration: 0 hours 11 minutes 10 seconds  Findings:      The examined esophagus was normal.      The Z-line was regular and was found 38 cm from the incisors.      Patchy mild inflammation characterized by congestion (edema), erythema       and granularity was found in the gastric antrum. Biopsies were taken       with a cold forceps for histology.      The exam of the stomach was otherwise normal.      The duodenal bulb and second portion of the duodenum were normal.      No endoscopic abnormality was evident in the esophagus to explain the       patient's complaint of dysphagia. It was decided, however, to proceed       with dilation of the entire esophagus. The scope was withdrawn. Dilation       was performed with a Maloney dilator with mild resistance at 45 Fr. The       dilation site was examined and showed no bleeding, mucosal tear or       perforation. Impression:               - Normal esophagus.                           -  Z-line regular, 38 cm from the incisors.                           - Gastritis. Biopsied.                           - Normal duodenal bulb and second portion of the                            duodenum.                           - No endoscopic esophageal abnormality to explain                            patient's dysphagia. Esophagus dilated. Dilated. Moderate Sedation:      Moderate (conscious) sedation was administered by the  endoscopy nurse       and supervised by the endoscopist. The following parameters were       monitored: oxygen saturation, heart rate, blood pressure, CO2       capnography and response to care. Total physician intraservice time was       17 minutes. Recommendation:           - Return patient to hospital ward for ongoing care.                           - Diabetic (ADA) diet today.                           - Continue present medications.                           - Await pathology results.                           - Resume heparin at prior dose today. Procedure Code(s):        --- Professional ---                           [phone], Esophagogastroduodenoscopy, flexible,                            transoral; with biopsy, single or multiple                           43450, Dilation of esophagus, by unguided sound or                            bougie, single or multiple passes                           99152, Moderate sedation services provided by the                            same physician or other qualified health care  professional performing the diagnostic or                            therapeutic service that the sedation supports,                            requiring the presence of an independent trained                            observer to assist in the monitoring of the                            patient's level of consciousness and physiological                            status; initial 15 minutes of intraservice time,                            patient age 69 years or older Diagnosis Code(s):        --- Professional ---                           K29.70, Gastritis, unspecified, without bleeding                           R10.10, Upper abdominal pain, unspecified                           R13.14, Dysphagia, pharyngoesophageal phase                           R11.0, Nausea CPT copyright 2016 [employer]. All rights reserved. The codes  documented in this report are preliminary and upon coder review may  be revised to meet current compliance requirements. Hildred Laser, MD Hildred Laser, MD 10/13/2015 9:54:04 AM This report has been signed electronically. Number of Addenda: 0

## 2015-10-13 NOTE — Progress Notes (Signed)
PROGRESS NOTE    Kathryn Beck  [id number] DOB: May 16, 1955 DOA: 10/10/2015 PCP: Tula Nakayama, MD    Brief Narrative:  60 y.o. female with medical history significant of type 2 diabetes mellitus, hypertension, obesity, sleep apnea, hyperlipidemia who initially presented to PCP on 10/09/2015 with complaints of feeling "bad all over" with abdominal cramping, decreased appetite and dehydration. Specifically, patient reported abdominal pain with eating. Patient was thought to have gastroesophageal reflux disease and was given a course of PPI. Symptoms worsened and patient subsequently presented to the emergency department.   Assessment & Plan:   Principal Problem:   Pancreatitis Active Problems:   Diabetes mellitus, insulin dependent (IDDM), uncontrolled ([place])   Mixed hyperlipidemia   Obesity   Essential hypertension   Acute pancreatitis   ARF (acute renal failure) (HCC)   Abdominal pain   AKI (acute kidney injury) ([place])   Esophageal reflux   Dysphagia   Abdominal pain, suspected acute pancreatitis -Mildly elevated lipase on admission with upper quadrant abdominal pain. -Patient currently advancing diet with hydration and analgesics as needed. -Pt still with difficulty tolerating diet. Appreciate input by GI -Patient underwent EGD. Findings of gastritis with a normal-appearing esophagus noted. No endoscopic esophageal abnormality to explain patient's dysphagia noted. -Patient has been continued on a regular diet. Continue to monitor.  Diabetes mellitus -We'll continue patient on supplemental sliding regimen -Initially continued on lower dose of levemir as pt is with limited by mouth intake increase insulin as needed.  Hyperlipidemia -Seems stable at present  Obesity -Stable  Hypertension -Blood pressure stable at present  Acute renal failure -Patient with creatinine of over 2 on presentation -Continue patient with IV fluids as per above -Renal function has  shown improvement with hydration  HTN -Hold diuretics and nephrotoxic agents for now -Cont on PRN hydralazine   DVT prophylaxis: Heparin subQ Code Status: Full Family Communication: Pt in room Disposition Plan: Uncertain at this time   Consultants:   Gastroenterology  Procedures:     Antimicrobials:  Anti-infectives    None      Subjective: Still reports epigastric discomfort this morning.  Objective: Vitals:   10/13/15 0935 10/13/15 0940 10/13/15 0945 10/13/15 1115  BP: (!) 153/70 133/80 132/85   Pulse: 86 74 71   Resp: (!) 22 16 14    Temp:      TempSrc:      SpO2: 100% 100% 100% 95%  Weight:      Height:        Intake/Output Summary (Last 24 hours) at 10/13/15 1120 Last data filed at 10/13/15 0800  Gross per 24 hour  Intake             1380 ml  Output                0 ml  Net             1380 ml   Filed Weights   10/10/15 1242 10/10/15 1757  Weight: 90.3 kg (199 lb) 90.4 kg (199 lb 3.2 oz)    Examination:  General exam: Appears calm and comfortable, Lying in bed  Respiratory system: Clear to auscultation. Respiratory effort normal. Cardiovascular system: S1 & S2 heard, RRR. No JVD Gastrointestinal system: Abdomen is nondistended, soft and nontender. No organomegaly or masses felt. Normal bowel sounds heard. Central nervous system: Alert and oriented. No focal neurological deficits. Extremities: Symmetric 5 x 5 power. Skin: No rashes, lesions Psychiatry: Judgement and insight appear normal. Mood & affect appropriate.  Data Reviewed: I have personally reviewed following labs and imaging studies  CBC:  Recent Labs Lab 10/10/15 1256 10/11/15 0520 10/12/15 0500  WBC 8.5 5.6 5.1  HGB 13.1 11.7* 11.4*  HCT 40.5 37.2 36.5  MCV 69.9* 70.6* 71.2*  PLT 380 369 [id number]   Basic Metabolic Panel:  Recent Labs Lab 10/10/15 1256 10/11/15 0520 10/11/15 1241 10/12/15 0515 10/13/15 0733  NA 133* 132* 131* 134* 135  K 5.0 6.2* 5.7* 5.0 4.6  CL  103 102 102 104 106  CO2 22 23 20* 22 22  GLUCOSE 101* 194* 178* 201* 219*  BUN 40* 31* 28* 25* 27*  CREATININE 2.03* 1.84* 1.80* 1.77* 1.79*  CALCIUM 9.6 8.5* 8.8* 8.5* 8.6*  MG  --   --   --  1.9  --    GFR: Estimated Creatinine Clearance: 38.3 mL/min (by C-G formula based on SCr of 1.79 mg/dL). Liver Function Tests:  Recent Labs Lab 10/10/15 1256 10/11/15 0520 10/13/15 0733  AST 23 22 19   ALT 28 25 25   ALKPHOS 95 81 80  BILITOT 0.4 0.3 0.3  PROT 7.9 6.5 6.4*  ALBUMIN 3.7 3.0* 2.9*    Recent Labs Lab 10/10/15 1256 10/12/15 0500 10/13/15 0733  LIPASE 133* 91* 119*  AMYLASE  --  66 71   No results for input(s): AMMONIA in the last 168 hours. Coagulation Profile: No results for input(s): INR, PROTIME in the last 168 hours. Cardiac Enzymes: No results for input(s): CKTOTAL, CKMB, CKMBINDEX, TROPONINI in the last 168 hours. BNP (last 3 results) No results for input(s): PROBNP in the last 8760 hours. HbA1C: No results for input(s): HGBA1C in the last 72 hours. CBG:  Recent Labs Lab 10/12/15 0745 10/12/15 1125 10/12/15 1650 10/12/15 2231 10/13/15 0724  GLUCAP 197* 251* 174* 339* 220*   Lipid Profile: No results for input(s): CHOL, HDL, LDLCALC, TRIG, CHOLHDL, LDLDIRECT in the last 72 hours. Thyroid Function Tests: No results for input(s): TSH, T4TOTAL, FREET4, T3FREE, THYROIDAB in the last 72 hours. Anemia Panel: No results for input(s): VITAMINB12, FOLATE, FERRITIN, TIBC, IRON, RETICCTPCT in the last 72 hours. Sepsis Labs: No results for input(s): PROCALCITON, LATICACIDVEN in the last 168 hours.  No results found for this or any previous visit (from the past 240 hour(s)).       Radiology Studies: No results found.      Scheduled Meds: . amLODipine  10 mg Oral Daily  . cloNIDine  0.1 mg Oral BID  . docusate sodium  100 mg Oral BID  . gabapentin  100 mg Oral TID  . insulin aspart  0-15 Units Subcutaneous TID WC  . insulin aspart  0-5 Units  Subcutaneous QHS  . insulin detemir  35 Units Subcutaneous QHS  . meperidine      . metoCLOPramide (REGLAN) injection  10 mg Intravenous Q6H  . midazolam      . pantoprazole  40 mg Oral BID AC   Continuous Infusions: . sodium chloride 100 mL/hr at 10/12/15 2233     LOS: 3 days    CHIU, Orpah Melter, MD [facility] Pager [phone]  If 7PM-7AM, please contact night-coverage [email or web address] Password [facility] 10/13/2015, 11:20 AM

## 2015-10-13 NOTE — Progress Notes (Signed)
Report called to Scotty Court RN  To [phone] via strecher

## 2015-10-14 DIAGNOSIS — E1065 Type 1 diabetes mellitus with hyperglycemia: Secondary | ICD-10-CM

## 2015-10-14 DIAGNOSIS — E669 Obesity, unspecified: Secondary | ICD-10-CM

## 2015-10-14 DIAGNOSIS — E1029 Type 1 diabetes mellitus with other diabetic kidney complication: Secondary | ICD-10-CM

## 2015-10-14 LAB — COMPREHENSIVE METABOLIC PANEL
ALBUMIN: 2.7 g/dL — AB (ref 3.5–5.0)
ALT: 25 U/L (ref 14–54)
AST: 21 U/L (ref 15–41)
Alkaline Phosphatase: 74 U/L (ref 38–126)
Anion gap: 7 (ref 5–15)
BUN: 22 mg/dL — AB (ref 6–20)
CHLORIDE: 110 mmol/L (ref 101–111)
CO2: 20 mmol/L — AB (ref 22–32)
CREATININE: 1.68 mg/dL — AB (ref 0.44–1.00)
Calcium: 8.5 mg/dL — ABNORMAL LOW (ref 8.9–10.3)
GFR calc Af Amer: 37 mL/min — ABNORMAL LOW (ref >60)
GFR, EST NON AFRICAN AMERICAN: 32 mL/min — AB (ref >60)
GLUCOSE: 277 mg/dL — AB (ref 65–99)
POTASSIUM: 4.5 mmol/L (ref 3.5–5.1)
SODIUM: 137 mmol/L (ref 135–145)
Total Bilirubin: 0.3 mg/dL (ref 0.3–1.2)
Total Protein: 6 g/dL — ABNORMAL LOW (ref 6.5–8.1)

## 2015-10-14 LAB — GLUCOSE, CAPILLARY
GLUCOSE-CAPILLARY: 280 mg/dL — AB (ref 65–99)
GLUCOSE-CAPILLARY: 236 mg/dL — AB (ref 65–99)
GLUCOSE-CAPILLARY: 265 mg/dL — AB (ref 65–99)
Glucose-Capillary: 287 mg/dL — ABNORMAL HIGH (ref 65–99)

## 2015-10-14 LAB — LIPASE, BLOOD: Lipase: 101 U/L — ABNORMAL HIGH (ref 11–51)

## 2015-10-14 MED ORDER — ONDANSETRON HCL 4 MG/2ML IJ SOLN
4.0000 mg | Freq: Three times a day (TID) | INTRAMUSCULAR | Status: DC
  Administered 2015-10-14 – 2015-10-16 (×8): 4 mg via INTRAVENOUS
  Filled 2015-10-14 (×9): qty 2

## 2015-10-14 MED ORDER — DICYCLOMINE HCL 10 MG PO CAPS
10.0000 mg | ORAL_CAPSULE | Freq: Three times a day (TID) | ORAL | Status: DC
  Administered 2015-10-14 – 2015-10-15 (×4): 10 mg via ORAL
  Filled 2015-10-14 (×4): qty 1

## 2015-10-14 MED ORDER — INSULIN DETEMIR 100 UNIT/ML ~~LOC~~ SOLN
45.0000 [IU] | Freq: Every day | SUBCUTANEOUS | Status: DC
  Administered 2015-10-14: 45 [IU] via SUBCUTANEOUS
  Filled 2015-10-14: qty 0.45

## 2015-10-14 NOTE — Progress Notes (Signed)
Subjective: Overall feels much improved from admission. Some continued abdominal pain and nausea. Worse after eating. On prn Zofran which helps. Deneis hematochezia, melena. No other upper or lower GI symptoms.  Objective: Vital signs in last 24 hours: Temp:  [98 F (36.7 C)-98.7 F (37.1 C)] 98.2 F (36.8 C) (07/24 0525) Pulse Rate:  [67-87] 72 (07/24 0525) Resp:  [14-24] 16 (07/24 0525) BP: (132-157)/(68-94) 135/72 (07/24 0525) SpO2:  [94 %-100 %] 97 % (07/24 0525) Last BM Date: 10/13/15 General:   Obese female, alert and oriented, pleasant Eyes:  No icterus, sclera clear. Conjuctiva pink.  Mouth:  Without lesions, mucosa pink and moist.  Lungs: Clear to auscultation bilaterally, without wheezing, rales, or rhonchi.  Abdomen:  Bowel sounds present, rounded but soft, non-distended. Continued bt improved abdominal pain on palpation. No HSM or hernias noted. No rebound or guarding. No masses appreciated  Neurologic:  Alert and  oriented x4;  grossly normal neurologically. Skin:  Warm and dry, intact without significant lesions.  Psych:  Alert and cooperative. Normal mood and affect.  Intake/Output from previous day: 07/23 0701 - 07/24 0700 In: 480 [P.O.:480] Out: -  Intake/Output this shift: No intake/output data recorded.  Lab Results:  Recent Labs  10/12/15 0500  WBC 5.1  HGB 11.4*  HCT 36.5  PLT 358   BMET  Recent Labs  10/12/15 0515 10/13/15 0733 10/14/15 0627  NA 134* 135 137  K 5.0 4.6 4.5  CL 104 106 110  CO2 22 22 20*  GLUCOSE 201* 219* 277*  BUN 25* 27* 22*  CREATININE 1.77* 1.79* 1.68*  CALCIUM 8.5* 8.6* 8.5*   LFT  Recent Labs  10/13/15 0733 10/14/15 0627  PROT 6.4* 6.0*  ALBUMIN 2.9* 2.7*  AST 19 21  ALT 25 25  ALKPHOS 80 74  BILITOT 0.3 0.3   PT/INR No results for input(s): LABPROT, INR in the last 72 hours. Hepatitis Panel No results for input(s): HEPBSAG, HCVAB, HEPAIGM, HEPBIGM in the last 72 hours.   Studies/Results: No  results found.  Assessment: 60 year old female admitted with acute pancreatitis with elevated lipase 133, no signs of pancreatitis on CT imaging. Describes poorly controlled GERD with worsening symptoms over the past couple months, new onset dysphagia about 2 weeks ago without regurgitation, and severe dyspepsia symptoms in the past week which has caused her to avoid adequate po intake due to pain and other symptoms. Prevacid as outpatient previously worked, per home med list patient is now on omeprazole which doesn't seem to have helped.   Her presentation does not seem consistent with pancreatitis. Differentials for acute symptoms include GERD exacerbation, esophagitis, gastritis, PUD, H. Pylori, duodenitis, duodenal ulcer. Given her other symptoms, dysphagia likely esophagitis in nature, although cannot rule out stricture, web, or ring. What she has eaten passes and she has had bowel movements (last one last night) so low suspicion of esophageal obstruction.  Seen by Dr. Laural Golden over the weekend and noted dyspepsia likely due to gastroparesis but agreed on need to rule out PUD. Previous evaluations for cholelithiasis/cholecystitis noted normal ultrasound and HIDA 8 years ago EF 42%. She was given a 4 dose trial of Reglan, follow-up pancreatic enzyme labs, and arranged for EGD. Amylase normal, lipase mildly elevated (119).   EGD completed 10/13/15 and noted normal esophagus s/p dilation, gastritis s/p biopsy, normal duodenum. Pathology pending.  Today she states she's overall much improved from admission. Continued nausea after eating. Discussed dietary management of possible gastroparesis with her.  Advised to eat smaller amounts throughout the day. No red flag/warning signs/symptoms currently.   Plan: 1. Follow for pathology 2. GES in the future (possibly as outpatient): not likely able to tolerate currently due to pain/nausea 3. Continued PPI bid 4. Nausea management: will scheudle Zofran prior  to meals for better control. 5. Supportive measures.   Walden Field, AGNP-C Adult & Gerontological Nurse Practitioner [facility]     LOS: 4 days    10/14/2015, 9:16 AM

## 2015-10-14 NOTE — Progress Notes (Signed)
PROGRESS NOTE    Kathryn Beck  [id number] DOB: [birth year] DOA: 10/10/2015 PCP: Tula Nakayama, MD    Brief Narrative:  60 y.o. female with medical history significant of type 2 diabetes mellitus, hypertension, obesity, sleep apnea, hyperlipidemia who initially presented to PCP on 10/09/2015 with complaints of feeling "bad all over" with abdominal cramping, decreased appetite and dehydration. Specifically, patient reported abdominal pain with eating. Patient was thought to have gastroesophageal reflux disease and was given a course of PPI. Symptoms worsened and patient subsequently presented to the emergency department.   Assessment & Plan:   Principal Problem:   Pancreatitis Active Problems:   Diabetes mellitus, insulin dependent (IDDM), uncontrolled ([place])   Mixed hyperlipidemia   Obesity   Essential hypertension   Acute pancreatitis   ARF (acute renal failure) (HCC)   Abdominal pain   AKI (acute kidney injury) ([place])   Esophageal reflux   Dysphagia   Abdominal pain, suspected acute pancreatitis -Mildly elevated lipase on admission with upper quadrant abdominal pain. -Patient currently advancing diet with hydration and analgesics as needed. -Pt still with difficulty tolerating diet. Appreciate input by GI -Patient underwent EGD. Findings of gastritis with a normal-appearing esophagus noted. No endoscopic esophageal abnormality to explain patient's dysphagia noted. -Patient has been continued on a regular diet.  -Still complaining of pain. Discussed case with GI. Plan for starting dicyclomine  Diabetes mellitus -Will continue patient on supplemental sliding regimen -Initially continued on lower dose of levemir as pt is with limited by mouth intake increase insulin as needed.  Hyperlipidemia -Seems stable at present  Obesity -Stable  Hypertension -Blood pressure stable at present  Acute renal failure -Patient with creatinine of over 2 on presentation -Continue  patient with IV fluids as per above -Renal function has shown improvement with hydration -Cont to monitor  HTN -Hold diuretics and nephrotoxic agents for now -Cont on PRN hydralazine   DVT prophylaxis: Heparin subQ Code Status: Full Family Communication: Pt in room Disposition Plan: Uncertain at this time   Consultants:   Gastroenterology  Procedures:     Antimicrobials:  Anti-infectives    None      Subjective: Complaining of abd pain and nausea with eating  Objective: Vitals:   10/13/15 2123 10/14/15 0525 10/14/15 1408 10/14/15 1456  BP: (!) 141/68 135/72  (!) 159/84  Pulse: 67 72  69  Resp: 16 16    Temp: 98 F (36.7 C) 98.2 F (36.8 C)  98.2 F (36.8 C)  TempSrc: Oral Oral  Oral  SpO2: 98% 97% 97% 100%  Weight:      Height:        Intake/Output Summary (Last 24 hours) at 10/14/15 1546 Last data filed at 10/14/15 1300  Gross per 24 hour  Intake              720 ml  Output                0 ml  Net              720 ml   Filed Weights   10/10/15 1242 10/10/15 1757  Weight: 90.3 kg (199 lb) 90.4 kg (199 lb 3.2 oz)    Examination:  General exam: Appears calm and comfortable, Lying in bed  Respiratory system: Clear to auscultation. Respiratory effort normal. Cardiovascular system: S1 & S2 heard, RRR. No JVD Gastrointestinal system: Abdomen is nondistended, obese,, soft and nontender. No organomegaly or masses felt. Normal bowel sounds heard. Central nervous system: Alert  and oriented. No focal neurological deficits. Extremities: Symmetric 5 x 5 power. Skin: No rashes, lesions Psychiatry: Judgement and insight appear normal. Mood & affect appropriate.     Data Reviewed: I have personally reviewed following labs and imaging studies  CBC:  Recent Labs Lab 10/10/15 1256 10/11/15 0520 10/12/15 0500  WBC 8.5 5.6 5.1  HGB 13.1 11.7* 11.4*  HCT 40.5 37.2 36.5  MCV 69.9* 70.6* 71.2*  PLT 380 369 [id number]   Basic Metabolic Panel:  Recent  Labs Lab 10/11/15 0520 10/11/15 1241 10/12/15 0515 10/13/15 0733 10/14/15 0627  NA 132* 131* 134* 135 137  K 6.2* 5.7* 5.0 4.6 4.5  CL 102 102 104 106 110  CO2 23 20* 22 22 20*  GLUCOSE 194* 178* 201* 219* 277*  BUN 31* 28* 25* 27* 22*  CREATININE 1.84* 1.80* 1.77* 1.79* 1.68*  CALCIUM 8.5* 8.8* 8.5* 8.6* 8.5*  MG  --   --  1.9  --   --    GFR: Estimated Creatinine Clearance: 40.8 mL/min (by C-G formula based on SCr of 1.68 mg/dL). Liver Function Tests:  Recent Labs Lab 10/10/15 1256 10/11/15 0520 10/13/15 0733 10/14/15 0627  AST 23 22 19 21   ALT 28 25 25 25   ALKPHOS 95 81 80 74  BILITOT 0.4 0.3 0.3 0.3  PROT 7.9 6.5 6.4* 6.0*  ALBUMIN 3.7 3.0* 2.9* 2.7*    Recent Labs Lab 10/10/15 1256 10/12/15 0500 10/13/15 0733 10/14/15 0627  LIPASE 133* 91* 119* 101*  AMYLASE  --  66 71  --    No results for input(s): AMMONIA in the last 168 hours. Coagulation Profile: No results for input(s): INR, PROTIME in the last 168 hours. Cardiac Enzymes: No results for input(s): CKTOTAL, CKMB, CKMBINDEX, TROPONINI in the last 168 hours. BNP (last 3 results) No results for input(s): PROBNP in the last 8760 hours. HbA1C: No results for input(s): HGBA1C in the last 72 hours. CBG:  Recent Labs Lab 10/13/15 1120 10/13/15 1636 10/13/15 2033 10/14/15 0828 10/14/15 1151  GLUCAP 190* 289* 289* 280* 287*   Lipid Profile: No results for input(s): CHOL, HDL, LDLCALC, TRIG, CHOLHDL, LDLDIRECT in the last 72 hours. Thyroid Function Tests: No results for input(s): TSH, T4TOTAL, FREET4, T3FREE, THYROIDAB in the last 72 hours. Anemia Panel: No results for input(s): VITAMINB12, FOLATE, FERRITIN, TIBC, IRON, RETICCTPCT in the last 72 hours. Sepsis Labs: No results for input(s): PROCALCITON, LATICACIDVEN in the last 168 hours.  No results found for this or any previous visit (from the past 240 hour(s)).       Radiology Studies: No results found.      Scheduled Meds: .  allopurinol  300 mg Oral Daily  . amLODipine  10 mg Oral Daily  . cloNIDine  0.1 mg Oral BID  . dicyclomine  10 mg Oral TID AC  . docusate sodium  100 mg Oral BID  . gabapentin  100 mg Oral TID  . insulin aspart  0-15 Units Subcutaneous TID WC  . insulin aspart  0-5 Units Subcutaneous QHS  . insulin detemir  45 Units Subcutaneous QHS  . ondansetron (ZOFRAN) IV  4 mg Intravenous TID AC & HS  . pantoprazole  40 mg Oral BID AC   Continuous Infusions: . sodium chloride 100 mL/hr at 10/14/15 1248     LOS: 4 days    CHIU, Orpah Melter, MD [facility] Pager [phone]  If 7PM-7AM, please contact night-coverage [email or web address] Password [facility] 10/14/2015, 3:46 PM

## 2015-10-14 NOTE — Care Management Note (Signed)
Case Management Note  Patient Details  Name: Kathryn Beck MRN: [id number] Date of Birth: Apr 04, 1955  Subjective/Objective:                  Pt is from home, lives with husband and family and is ind with ADL's. Pt has PCP, transportation to appointments and no difficulty affording medications. Pt plans to return home with self care at DC.   Action/Plan: No CM needs.   Expected Discharge Date:     10/15/2015             Expected Discharge Plan:  Home/Self Care  In-House Referral:  NA  Discharge planning Services  CM Consult  Post Acute Care Choice:    Choice offered to:     DME Arranged:    DME Agency:     HH Arranged:    HH Agency:     Status of Service:  Completed, signed off  If discussed at [employer] of Stay Meetings, dates discussed:    Additional Comments:  Sherald Barge, RN 10/14/2015, 3:33 PM

## 2015-10-15 ENCOUNTER — Telehealth: Payer: Self-pay | Admitting: Gastroenterology

## 2015-10-15 LAB — BASIC METABOLIC PANEL
Anion gap: 3 — ABNORMAL LOW (ref 5–15)
BUN: 20 mg/dL (ref 6–20)
CHLORIDE: 112 mmol/L — AB (ref 101–111)
CO2: 22 mmol/L (ref 22–32)
Calcium: 8.4 mg/dL — ABNORMAL LOW (ref 8.9–10.3)
Creatinine, Ser: 1.53 mg/dL — ABNORMAL HIGH (ref 0.44–1.00)
GFR calc non Af Amer: 36 mL/min — ABNORMAL LOW (ref >60)
GFR, EST AFRICAN AMERICAN: 42 mL/min — AB (ref >60)
Glucose, Bld: 192 mg/dL — ABNORMAL HIGH (ref 65–99)
POTASSIUM: 4.2 mmol/L (ref 3.5–5.1)
SODIUM: 137 mmol/L (ref 135–145)

## 2015-10-15 LAB — GLUCOSE, CAPILLARY
GLUCOSE-CAPILLARY: 166 mg/dL — AB (ref 65–99)
GLUCOSE-CAPILLARY: 303 mg/dL — AB (ref 65–99)
Glucose-Capillary: 269 mg/dL — ABNORMAL HIGH (ref 65–99)

## 2015-10-15 MED ORDER — INSULIN DETEMIR 100 UNIT/ML ~~LOC~~ SOLN
55.0000 [IU] | Freq: Every day | SUBCUTANEOUS | Status: DC
Start: 2015-10-15 — End: 2015-10-15
  Filled 2015-10-15: qty 0.55

## 2015-10-15 MED ORDER — INSULIN DETEMIR 100 UNIT/ML ~~LOC~~ SOLN
23.0000 [IU] | Freq: Every day | SUBCUTANEOUS | Status: DC
  Administered 2015-10-15: 23 [IU] via SUBCUTANEOUS
  Filled 2015-10-15 (×2): qty 0.23

## 2015-10-15 NOTE — Progress Notes (Signed)
    Subjective: Notes postprandial urgency after eating. Abdominal pain in upper abdomen worse after eating. Tolerated breakfast. Required morphine after eating. Some mild improvement with dicyclomine.   Objective: Vital signs in last 24 hours: Temp:  [97.9 F (36.6 C)-98.2 F (36.8 C)] 98.2 F (36.8 C) (07/25 0723) Pulse Rate:  [64-70] 64 (07/25 0723) Resp:  [16-20] 20 (07/25 0723) BP: (133-159)/(62-84) 144/77 (07/25 0723) SpO2:  [97 %-100 %] 100 % (07/25 0723) Last BM Date: 10/13/15 General:   Alert and oriented, pleasant Head:  Normocephalic and atraumatic. Abdomen:  Bowel sounds present, soft, TTP diffusely to abdomen but moreso upper abdomen, distended but soft. Obese.  Msk:  Symmetrical without gross deformities. Normal posture. Psych:  Alert and cooperative. Normal mood and affect.  Intake/Output from previous day: 07/24 0701 - 07/25 0700 In: 480 [P.O.:480] Out: -  Intake/Output this shift: No intake/output data recorded. BMET  Recent Labs  10/13/15 0733 10/14/15 0627 10/15/15 0549  NA 135 137 137  K 4.6 4.5 4.2  CL 106 110 112*  CO2 22 20* 22  GLUCOSE 219* 277* 192*  BUN 27* 22* 20  CREATININE 1.79* 1.68* 1.53*  CALCIUM 8.6* 8.5* 8.4*   LFT  Recent Labs  10/13/15 0733 10/14/15 0627  PROT 6.4* 6.0*  ALBUMIN 2.9* 2.7*  AST 19 21  ALT 25 25  ALKPHOS 80 74  BILITOT 0.3 0.3   Lab Results  Component Value Date   LIPASE 101 (H) 10/14/2015     Assessment: 60 year old female admitted with abdominal pain and found to have non-specific elevated lipase but without findings of acute pancreatitis on NON-contrast CT. No elevation in LFTs. EGD this admission with gastritis s/p biopsy (focal gastritis). Dysphagia improved with empiric dilation.   Abdominal pain persists but she has noted slight improvement with dicyclomine. Some correlation with postprandial BMs after eating. Differentials including gastritis, IBS, unable to exclude biliary etiology as  gallbladder remains in situ. Discussed with patient further work-up as outpatient. Could pursue US/HIDA as outpatient, as this was last completed in remote past. If she continues to tolerate her diet without vomiting, would agree with discharge home and close outpatient follow up in our office.     Plan: Continue Bentyl before meals Continue Protonix  Will arrange outpatient follow-up in next 1-2 weeks for further evaluation Hopeful discharge in next 12-24 hours  Orvil Feil, ANP-BC [facility]     LOS: 5 days    10/15/2015, 10:11 AM

## 2015-10-15 NOTE — Telephone Encounter (Signed)
Attempted to reach patient.  Voicemail is full.   Unable to leave message.  Will await for patient to call office again.

## 2015-10-15 NOTE — Progress Notes (Signed)
PROGRESS NOTE    Kathryn Beck  [id number] DOB: 05-04-1955 DOA: 10/10/2015 PCP: Tula Nakayama, MD    Brief Narrative:  60 y.o. female with medical history significant of type 2 diabetes mellitus, hypertension, obesity, sleep apnea, hyperlipidemia who initially presented to PCP on 10/09/2015 with complaints of feeling "bad all over" with abdominal cramping, decreased appetite and dehydration. Specifically, patient reported abdominal pain with eating. Patient was thought to have gastroesophageal reflux disease and was given a course of PPI. Symptoms worsened and patient subsequently presented to the emergency department.   Assessment & Plan:   Principal Problem:   Pancreatitis Active Problems:   Diabetes mellitus, insulin dependent (IDDM), uncontrolled (HCC)   Mixed hyperlipidemia   Obesity   Essential hypertension   Acute pancreatitis   ARF (acute renal failure) (HCC)   Abdominal pain   AKI (acute kidney injury) ([place])   Esophageal reflux   Dysphagia   Abdominal pain, likely IBS vs gastritis -Mildly elevated lipase on admission with upper quadrant abdominal pain. -Patient underwent EGD. Findings of gastritis with a normal-appearing esophagus noted. No endoscopic esophageal abnormality to explain patient's dysphagia noted. -Patient has been continued on a regular diet, tolerating, however reports continued pain/nausea.  -Appreciate input by GI. Recs for possible outpt US/HIDA -Will d/c morphine as it is not indicated for routine management for either IBS or gastritis   Diabetes mellitus -Will continue patient on supplemental sliding regimen -Initially continued on lower dose of levemir as pt is with limited by mouth intake increase insulin as needed.  Hyperlipidemia -Seems stable at present  Obesity -Stable  Hypertension -Blood pressure stable at present  Acute renal failure -Patient with creatinine of over 2 on presentation -Continue patient with IV fluids as  per above -Renal function has shown improvement with hydration -Cont to monitor  HTN -Hold diuretics and nephrotoxic agents for now -Cont on PRN hydralazine   DVT prophylaxis: Heparin subQ Code Status: Full Family Communication: Pt in room Disposition Plan: Uncertain at this time   Consultants:   Gastroenterology  Procedures:     Antimicrobials:  Anti-infectives    None      Subjective: Reports pain and nausea with food  Objective: Vitals:   10/14/15 1456 10/14/15 2138 10/15/15 0723 10/15/15 1337  BP: (!) 159/84 133/62 (!) 144/77 131/74  Pulse: 69 70 64 (!) 58  Resp:  16 20 18   Temp: 98.2 F (36.8 C) 97.9 F (36.6 C) 98.2 F (36.8 C) 98.3 F (36.8 C)  TempSrc: Oral Oral Oral Oral  SpO2: 100% 100% 100% 98%  Weight:      Height:        Intake/Output Summary (Last 24 hours) at 10/15/15 1357 Last data filed at 10/15/15 1337  Gross per 24 hour  Intake              240 ml  Output                0 ml  Net              240 ml   Filed Weights   10/10/15 1242 10/10/15 1757  Weight: 90.3 kg (199 lb) 90.4 kg (199 lb 3.2 oz)    Examination:  General exam: Appears calm and comfortable, Lying in bed  Respiratory system: Clear to auscultation. Respiratory effort normal. Cardiovascular system: S1 & S2 heard, RRR. No JVD Gastrointestinal system: Abdomen is nondistended, obese,, soft and nontender. No organomegaly or masses felt. Normal bowel sounds heard. Central nervous  system: Alert and oriented. No focal neurological deficits. Extremities: Symmetric 5 x 5 power. Skin: No rashes, lesions Psychiatry: Judgement and insight appear normal. Mood & affect appropriate.     Data Reviewed: I have personally reviewed following labs and imaging studies  CBC:  Recent Labs Lab 10/10/15 1256 10/11/15 0520 10/12/15 0500  WBC 8.5 5.6 5.1  HGB 13.1 11.7* 11.4*  HCT 40.5 37.2 36.5  MCV 69.9* 70.6* 71.2*  PLT 380 369 [id number]   Basic Metabolic Panel:  Recent  Labs Lab 10/11/15 1241 10/12/15 0515 10/13/15 0733 10/14/15 0627 10/15/15 0549  NA 131* 134* 135 137 137  K 5.7* 5.0 4.6 4.5 4.2  CL 102 104 106 110 112*  CO2 20* 22 22 20* 22  GLUCOSE 178* 201* 219* 277* 192*  BUN 28* 25* 27* 22* 20  CREATININE 1.80* 1.77* 1.79* 1.68* 1.53*  CALCIUM 8.8* 8.5* 8.6* 8.5* 8.4*  MG  --  1.9  --   --   --    GFR: Estimated Creatinine Clearance: 44.8 mL/min (by C-G formula based on SCr of 1.53 mg/dL). Liver Function Tests:  Recent Labs Lab 10/10/15 1256 10/11/15 0520 10/13/15 0733 10/14/15 0627  AST 23 22 19 21   ALT 28 25 25 25   ALKPHOS 95 81 80 74  BILITOT 0.4 0.3 0.3 0.3  PROT 7.9 6.5 6.4* 6.0*  ALBUMIN 3.7 3.0* 2.9* 2.7*    Recent Labs Lab 10/10/15 1256 10/12/15 0500 10/13/15 0733 10/14/15 0627  LIPASE 133* 91* 119* 101*  AMYLASE  --  66 71  --    No results for input(s): AMMONIA in the last 168 hours. Coagulation Profile: No results for input(s): INR, PROTIME in the last 168 hours. Cardiac Enzymes: No results for input(s): CKTOTAL, CKMB, CKMBINDEX, TROPONINI in the last 168 hours. BNP (last 3 results) No results for input(s): PROBNP in the last 8760 hours. HbA1C: No results for input(s): HGBA1C in the last 72 hours. CBG:  Recent Labs Lab 10/14/15 1151 10/14/15 1621 10/14/15 2113 10/15/15 0811 10/15/15 1137  GLUCAP 287* 236* 265* 166* 269*   Lipid Profile: No results for input(s): CHOL, HDL, LDLCALC, TRIG, CHOLHDL, LDLDIRECT in the last 72 hours. Thyroid Function Tests: No results for input(s): TSH, T4TOTAL, FREET4, T3FREE, THYROIDAB in the last 72 hours. Anemia Panel: No results for input(s): VITAMINB12, FOLATE, FERRITIN, TIBC, IRON, RETICCTPCT in the last 72 hours. Sepsis Labs: No results for input(s): PROCALCITON, LATICACIDVEN in the last 168 hours.  No results found for this or any previous visit (from the past 240 hour(s)).       Radiology Studies: No results found.      Scheduled Meds: .  allopurinol  300 mg Oral Daily  . amLODipine  10 mg Oral Daily  . cloNIDine  0.1 mg Oral BID  . dicyclomine  10 mg Oral TID AC  . docusate sodium  100 mg Oral BID  . gabapentin  100 mg Oral TID  . insulin aspart  0-15 Units Subcutaneous TID WC  . insulin aspart  0-5 Units Subcutaneous QHS  . insulin detemir  55 Units Subcutaneous QHS  . ondansetron (ZOFRAN) IV  4 mg Intravenous TID AC & HS  . pantoprazole  40 mg Oral BID AC   Continuous Infusions: . sodium chloride 100 mL/hr at 10/15/15 0918     LOS: 5 days    Hasson Gaspard, Orpah Melter, MD [facility] Pager [phone]  If 7PM-7AM, please contact night-coverage [email or web address] Password [email or web address] 10/15/2015, 1:57 PM

## 2015-10-15 NOTE — Telephone Encounter (Signed)
APPT MADE AND CALLED THE NURSE AT [place] AND SHE WILL LET PATIENT KNOW

## 2015-10-15 NOTE — Telephone Encounter (Signed)
Please arrange outpatient visit with myself or Randall Hiss for hospital follow-up in 1-2 weeks.

## 2015-10-16 ENCOUNTER — Inpatient Hospital Stay (HOSPITAL_COMMUNITY): Payer: 59

## 2015-10-16 ENCOUNTER — Ambulatory Visit: Payer: 59 | Admitting: Family Medicine

## 2015-10-16 DIAGNOSIS — R1013 Epigastric pain: Secondary | ICD-10-CM

## 2015-10-16 DIAGNOSIS — I10 Essential (primary) hypertension: Secondary | ICD-10-CM

## 2015-10-16 DIAGNOSIS — K851 Biliary acute pancreatitis without necrosis or infection: Secondary | ICD-10-CM

## 2015-10-16 LAB — GLUCOSE, CAPILLARY
GLUCOSE-CAPILLARY: 139 mg/dL — AB (ref 65–99)
Glucose-Capillary: 212 mg/dL — ABNORMAL HIGH (ref 65–99)
Glucose-Capillary: 291 mg/dL — ABNORMAL HIGH (ref 65–99)

## 2015-10-16 MED ORDER — ONDANSETRON 8 MG PO TBDP: 8.0000 mg | ORAL_TABLET | Freq: Three times a day (TID) | ORAL | 0 refills | Status: DC | PRN

## 2015-10-16 MED ORDER — SODIUM CHLORIDE 0.9% FLUSH
INTRAVENOUS | Status: AC
  Administered 2015-10-16: 10 mL
  Filled 2015-10-16: qty 120

## 2015-10-16 MED ORDER — TECHNETIUM TC 99M MEBROFENIN IV KIT
5.0000 | PACK | Freq: Once | INTRAVENOUS | Status: AC | PRN
  Administered 2015-10-16: 5 via INTRAVENOUS

## 2015-10-16 NOTE — Progress Notes (Signed)
Patient with orders to be discharge home. Discharge instructions given, patient verbalized understanding. Patient stable. Patient left in private vehicle with family.  

## 2015-10-16 NOTE — Discharge Summary (Addendum)
Physician Discharge Summary  [name] LPF:[id number] DOB: 30-Dec-1955 DOA: 10/10/2015  PCP: Kathryn Nakayama, MD  Admit date: 10/10/2015 Discharge date: 10/16/2015  Time spent: 45 minutes  Recommendations for Outpatient Follow-up:  -We'll be discharged home today. -Advised to follow-up with primary care provider 2 weeks and with Dr. Gwenyth Beck scheduled for 8/8.   Discharge Diagnoses:  Principal Problem:   Pancreatitis Active Problems:   Diabetes mellitus, insulin dependent (IDDM), uncontrolled (HCC)   Mixed hyperlipidemia   Obesity   Essential hypertension   ARF (acute renal failure) (HCC)   Abdominal pain   AKI (acute kidney injury) ([place])   Esophageal reflux   Dysphagia   Postprandial epigastric pain   Discharge Condition: Stable and improved  Filed Weights   10/10/15 1242 10/10/15 1757  Weight: 90.3 kg (199 lb) 90.4 kg (199 lb 3.2 oz)    History of present illness:  As per Dr. Wyline Beck on 7/20: Kathryn Beck is a 60 y.o. female with medical history significant of type 2 diabetes mellitus, hypertension, obesity, sleep apnea, hyperlipidemia who initially presented to PCP on 10/09/2015 with complaints of feeling "bad all over" with abdominal cramping, decreased appetite and dehydration. Specifically, patient reported abdominal pain with eating. Patient was thought to have gastroesophageal reflux disease and was given a course of PPI. Symptoms worsened and patient subsequently presented to the emergency department.  ED Course: In emergency department, patient was noted have a lipase of 133 with triglycerides of 711. Presenting creatinine noted to be 2.03 (was 1.37 on 07/23/2015) disease. Patient was started on IV fluids with analgesics. Hospitalist service was consulted for consideration for admission  Hospital Course:   Abdominal pain -Etiology remains unclear at this time. -Patient underwent EGD with findings of gastritis with normal-appearing esophagus, HIDA scan  negative for gallbladder pathology. -Possibly has IBS versus maybe even diabetic gastroparesis. -Pain is improved and she is tolerating diet, anxious for discharge today. -Pancreatitis ruled out on CT scan.  Obesity -Noted  Insulin-dependent diabetes -Fair control, continue outpatient management  Hypertension -Stable  Acute renal failure on chronic kidney disease stage III -Baseline creatinine is around 1.3-1.5, was over 2 on admission, creatinine is down to 1.5 on discharge.  Procedures:  None   Consultations:  Gastroenterology  Discharge Instructions  Discharge Instructions    Diet - low sodium heart healthy    Complete by:  As directed   Increase activity slowly    Complete by:  As directed       Medication List    STOP taking these medications   fluticasone 50 MCG/ACT nasal spray Commonly known as:  FLONASE   HYDROcodone-acetaminophen 5-325 MG tablet Commonly known as:  NORCO/VICODIN   montelukast 10 MG tablet Commonly known as:  SINGULAIR     TAKE these medications   ACCU-CHEK FASTCLIX LANCETS Misc Use as instructed three times daily dx E11.65   acetaminophen 325 MG tablet Commonly known as:  TYLENOL Take 650 mg by mouth every 6 (six) hours as needed for mild pain.   allopurinol 300 MG tablet Commonly known as:  ZYLOPRIM TAKE 1 TABLET (300 MG TOTAL) BY MOUTH DAILY.   amLODipine 10 MG tablet Commonly known as:  NORVASC Take 1 tablet by mouth  daily   azelastine 0.1 % nasal spray Commonly known as:  ASTELIN Place 2 sprays into both nostrils 2 (two) times daily. Use in each nostril as directed   cloNIDine 0.1 MG tablet Commonly known as:  CATAPRES Take 1 tablet by  mouth  every evening at 9pm for  blood pressure   cyclobenzaprine 10 MG tablet Commonly known as:  FLEXERIL Take 1 tablet by mouth  every night at bedtime as  needed for muscle spasm(s)   ergocalciferol 50000 units capsule Commonly known as:  VITAMIN D2 Take 1 capsule (50,000  Units total) by mouth once a week. One capsule once weekly   gabapentin 100 MG capsule Commonly known as:  NEURONTIN Take 1 capsule (100 mg total) by mouth 3 (three) times daily.   glucose blood test strip One touch verio strips, three times daily testing   Insulin Detemir 100 UNIT/ML Pen Commonly known as:  LEVEMIR FLEXTOUCH INJECT 90 UNITS INTO THE SKIN DAILY AT 10 PM.   insulin lispro 100 UNIT/ML injection Commonly known as:  HUMALOG Inject 10-25 Units into the skin See admin instructions. Twice daily as needed before meals. Patient uses according to sliding scale at home   Insulin Pen Needle 31G X 5 MM Misc Commonly known as:  B-D UF III MINI PEN NEEDLES Use as instructed three times daily   INVOKANA 100 MG Tabs tablet Generic drug:  canagliflozin TAKE 1 TABLET EVERY DAY   omeprazole 40 MG capsule Commonly known as:  PRILOSEC Take 1 capsule (40 mg total) by mouth daily.   ondansetron 8 MG disintegrating tablet Commonly known as:  ZOFRAN ODT Take 1 tablet (8 mg total) by mouth every 8 (eight) hours as needed for nausea or vomiting.   ONETOUCH VERIO IQ SYSTEM w/Device Kit Three times daily testing dx E11.65   ranitidine 150 MG tablet Commonly known as:  ZANTAC Take 1 tablet (150 mg total) by mouth 2 (two) times daily.   spironolactone 25 MG tablet Commonly known as:  ALDACTONE Take 1 tablet by mouth two  times daily   triamterene-hydrochlorothiazide 75-50 MG tablet Commonly known as:  MAXZIDE Take 1 tablet by mouth  daily   valsartan 320 MG tablet Commonly known as:  DIOVAN Take 1 tablet by mouth  daily   vitamin B-12 1000 MCG tablet Commonly known as:  CYANOCOBALAMIN Take 1,000 mcg by mouth daily.      Allergies  Allergen Reactions  . Ace Inhibitors Anaphylaxis and Swelling  . Penicillins Itching and Swelling    Has patient had a PCN reaction causing immediate rash, facial/tongue/throat swelling, SOB or lightheadedness with hypotension: Yes Has  patient had a PCN reaction causing severe rash involving mucus membranes or skin necrosis: No Has patient had a PCN reaction that required hospitalization No Has patient had a PCN reaction occurring within the last 10 years: No If all of the above answers are "NO", then may proceed with Cephalosporin use.   . Statins Other (See Comments)    elevated LFT's   Follow-up Information    Laban Emperor, NP Follow up on 10/29/2015.   Specialty:  Gastroenterology Why:  at 11:30 am Contact information: [address] [place] [address] [phone]            The results of significant diagnostics from this hospitalization (including imaging, microbiology, ancillary and laboratory) are listed below for reference.    Significant Diagnostic Studies: Ct Abdomen Pelvis Wo Contrast  Result Date: 10/10/2015 CLINICAL DATA:  Abdominal cramping with decreased appetite and dehydration. Pain worse with eating. EXAM: CT ABDOMEN AND PELVIS WITHOUT CONTRAST TECHNIQUE: Multidetector CT imaging of the abdomen and pelvis was performed following the standard protocol without IV contrast. COMPARISON:  MRI abdomen 08/23/2008 and CT 09/19/2001 FINDINGS: Lower chest:  Lung bases demonstrate scarring over the left base unchanged. 3 mm nodule right lower lobe. Hepatobiliary: No mass visualized on this un-enhanced exam. Pancreas: No mass or inflammatory process identified on this un-enhanced exam. Spleen: Within normal limits in size. 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 Kidneys are normal in size without hydronephrosis or nephrolithiasis. There is a 4.7 cm cyst over the lower pole right kidney. No nephrolithiasis. Ureters are normal. The bladder is within normal. Stomach/Bowel: No evidence of obstruction, inflammatory process, or abnormal fluid collections. Stomach and small bowel are within normal. Appendix is normal. Colon is within  normal. Vascular/Lymphatic: No pathologically enlarged lymph nodes. No evidence of abdominal aortic aneurysm. Very minimal calcified plaque over the abdominal aorta. Reproductive: Previous hysterectomy. Other: None. Musculoskeletal:  Mild degenerate change of the spine and hips. IMPRESSION: No acute findings in the abdomen/pelvis. 4.7 cm right renal cyst. 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 Minimal aortic atherosclerosis. Electronically Signed   By: Marin Olp M.D.   On: 10/10/2015 19:16   Nm Hepatobiliary Liver Func  Result Date: 10/16/2015 CLINICAL DATA:  Severe stomach cramps for 3 weeks. EXAM: NUCLEAR MEDICINE HEPATOBILIARY IMAGING WITH GALLBLADDER EF TECHNIQUE: Sequential images of the abdomen were obtained out to 60 minutes following intravenous administration of radiopharmaceutical. After oral ingestion of Ensure, gallbladder ejection fraction was determined. At 60 min, normal ejection fraction is greater than 33%. RADIOPHARMACEUTICALS:  5.1 mCi Tc-13m Choletec IV COMPARISON:  CT, 10/10/2015 FINDINGS: Prompt uptake and biliary excretion of activity by the liver is seen. Gallbladder activity is visualized, consistent with patency of cystic duct. Biliary activity passes into small bowel, consistent with patent common bile duct. Calculated gallbladder ejection fraction is 82%%. (Normal gallbladder ejection fraction with Ensure is greater than 33%.) IMPRESSION: Normal hepatobiliary imaging with normal gallbladder function. Electronically Signed   By: DLajean ManesM.D.   On: 10/16/2015 11:58   Microbiology: No results found for this or any previous visit (from the past 240 hour(s)).   Labs: Basic Metabolic Panel:  Recent Labs Lab 10/11/15 1241 10/12/15 0515 10/13/15 0733 10/14/15 0627 10/15/15 0549  NA 131* 134* 135 137 137  K 5.7* 5.0 4.6 4.5 4.2  CL 102 104 106 110 112*  CO2 20* 22 22 20* 22  GLUCOSE 178* 201* 219* 277* 192*  BUN 28* 25* 27* 22* 20  CREATININE 1.80*  1.77* 1.79* 1.68* 1.53*  CALCIUM 8.8* 8.5* 8.6* 8.5* 8.4*  MG  --  1.9  --   --   --    Liver Function Tests:  Recent Labs Lab 10/10/15 1256 10/11/15 0520 10/13/15 0733 10/14/15 0627  AST _0 ALT _1 ALKPHOS 95 81 80 74  BILITOT 0.4 0.3 0.3 0.3  PROT 7.9 6.5 6.4* 6.0*  ALBUMIN 3.7 3.0* 2.9* 2.7*    Recent Labs Lab 10/10/15 1256 10/12/15 0500 10/13/15 0733 10/14/15 0627  LIPASE 133* 91* 119* 101*  AMYLASE  --  66 71  --    No results for input(s): AMMONIA in the last 168 hours. CBC:  Recent Labs Lab 10/10/15 1256 10/11/15 0520 10/12/15 0500  WBC 8.5 5.6 5.1  HGB 13.1 11.7* 11.4*  HCT 40.5 37.2 36.5  MCV 69.9* 70.6* 71.2*  PLT 380 369 358   Cardiac Enzymes: No results for input(s): CKTOTAL, CKMB, CKMBINDEX, TROPONINI in the last 168 hours. BNP: BNP (last 3 results) No  results for input(s): BNP in the last 8760 hours.  ProBNP (last 3 results) No results for input(s): PROBNP in the last 8760 hours.  CBG:  Recent Labs Lab 10/15/15 0811 10/15/15 1137 10/15/15 2055 10/16/15 0747 10/16/15 1148  GLUCAP 166* 269* 303* 212* 291*       Signed:  HERNANDEZ ACOSTA,Kathryn Beck  [facility] Pager: [phone] 10/16/2015, 4:31 PM

## 2015-10-16 NOTE — Progress Notes (Signed)
Subjective:  Continues to complain of postprandial upper abdominal pain and nausea.   Objective: Vital signs in last 24 hours: Temp:  [97.8 F (36.6 C)-98.3 F (36.8 C)] 97.8 F (36.6 C) (07/25 2200) Pulse Rate:  [58-65] 65 (07/25 2200) Resp:  [16-18] 16 (07/25 2200) BP: (131-141)/(72-74) 141/72 (07/25 2200) SpO2:  [97 %-98 %] 97 % (07/25 2200) Last BM Date: 10/14/15 General:   Alert,  Well-developed, well-nourished, pleasant and cooperative in NAD Head:  Normocephalic and atraumatic. Eyes:  Sclera clear, no icterus.  Abdomen:  Soft, epigastric tenderness and nondistended. Normal bowel sounds, without guarding, and without rebound.   Extremities:  Without clubbing, deformity or edema. Neurologic:  Alert and  oriented x4;  grossly normal neurologically. Skin:  Intact without significant lesions or rashes. Psych:  Alert and cooperative. Normal mood and affect.  Intake/Output from previous day: 07/25 0701 - 07/26 0700 In: 480 [P.O.:480] Out: -  Intake/Output this shift: No intake/output data recorded.  Lab Results: CBC No results for input(s): WBC, HGB, HCT, MCV, PLT in the last 72 hours. BMET  Recent Labs  10/14/15 0627 10/15/15 0549  NA 137 137  K 4.5 4.2  CL 110 112*  CO2 20* 22  GLUCOSE 277* 192*  BUN 22* 20  CREATININE 1.68* 1.53*  CALCIUM 8.5* 8.4*   LFTs  Recent Labs  10/14/15 0627  BILITOT 0.3  ALKPHOS 74  AST 21  ALT 25  PROT 6.0*  ALBUMIN 2.7*    Recent Labs  10/14/15 0627  LIPASE 101*   PT/INR No results for input(s): LABPROT, INR in the last 72 hours.    Imaging Studies: Ct Abdomen Pelvis Wo Contrast  Result Date: 10/10/2015 CLINICAL DATA:  Abdominal cramping with decreased appetite and dehydration. Pain worse with eating. EXAM: CT ABDOMEN AND PELVIS WITHOUT CONTRAST TECHNIQUE: Multidetector CT imaging of the abdomen and pelvis was performed following the standard protocol without IV contrast. COMPARISON:  MRI abdomen 08/23/2008 and CT  09/19/2001 FINDINGS: Lower chest: Lung bases demonstrate scarring over the left base unchanged. 3 mm nodule right lower lobe. Hepatobiliary: No mass visualized on this un-enhanced exam. Pancreas: No mass or inflammatory process identified on this un-enhanced exam. Spleen: Within normal limits in size. 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 Kidneys are normal in size without hydronephrosis or nephrolithiasis. There is a 4.7 cm cyst over the lower pole right kidney. No nephrolithiasis. Ureters are normal. The bladder is within normal. Stomach/Bowel: No evidence of obstruction, inflammatory process, or abnormal fluid collections. Stomach and small bowel are within normal. Appendix is normal. Colon is within normal. Vascular/Lymphatic: No pathologically enlarged lymph nodes. No evidence of abdominal aortic aneurysm. Very minimal calcified plaque over the abdominal aorta. Reproductive: Previous hysterectomy. Other: None. Musculoskeletal:  Mild degenerate change of the spine and hips. IMPRESSION: No acute findings in the abdomen/pelvis. 4.7 cm right renal cyst. 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 Minimal aortic atherosclerosis. Electronically Signed   By: Marin Olp M.D.   On: 10/10/2015 19:16  [2 weeks]   Assessment: 60 year old female admitted with abdominal pain and found to have non-specific elevated lipase but without findings of acute pancreatitis on NON-contrast CT. No elevation in LFTs. EGD this admission with gastritis s/p biopsy (focal gastritis). Dysphagia improved with empiric dilation.   Abdominal pain persists. Differentials including gastritis, IBS, unable to exclude biliary etiology as gallbladder remains in situ. HIDA scheduled today per Dr. Oneida Alar.  Would agree with discharge home if HIDA unremarkable and close outpatient follow up in our office.   Plan: 1. F/U HIDA.  2. Continue  protonix. 3. Outpatient follow up in 1-2 weeks for further evaluation.   Laureen Ochs. Bernarda Caffey [facility] [phone] 7/26/20179:27 AM     LOS: 6 days

## 2015-10-16 NOTE — Progress Notes (Signed)
Inpatient Diabetes Program Recommendations  AACE/ADA: New Consensus Statement on Inpatient Glycemic Control (2015)  Target Ranges:  Prepandial:   less than 140 mg/dL      Peak postprandial:   less than 180 mg/dL (1-2 hours)      Critically ill patients:  140 - 180 mg/dL   Results for QUIANNA, CRANKSHAW (MRN [id number]) as of 10/16/2015 13:44  Ref. Range 10/15/2015 08:11 10/15/2015 11:37 10/15/2015 20:55 10/16/2015 07:47 10/16/2015 11:48  Glucose-Capillary Latest Ref Range: 65 - 99 mg/dL 166 (H) 269 (H) 303 (H) 212 (H) 291 (H)   Review of Glycemic Control Diabetes history: DM2 Outpatient Diabetes medications: Levemir 90 units QHS, Humalog 10-25 units BID with meals Current orders for Inpatient glycemic control: Levemir 23 units QHS, Novolog 0-15 units TID with meals, Novolog 0-5 units QHS   Inpatient Diabetes Program Recommendations: Insulin - Basal: Noted patient was ordered to receive Levemir 55 units at bedtime on 10/15/15 and then order was changed to Levemir 23 units QHS last night as patient was scheduled for HIDA scan today at 1:30 pm and would be NPO after midnight. If patient remains inpatient, please increase Levemir dose.  Thanks, Barnie Alderman, RN, MSN, CDE Diabetes Coordinator Inpatient Diabetes Program [phone] (Team Pager from [place] to [place]) [phone] ([facility] office) [phone] [facility] office) [phone] [facility] office)

## 2015-10-17 ENCOUNTER — Telehealth: Payer: Self-pay

## 2015-10-17 ENCOUNTER — Encounter (HOSPITAL_COMMUNITY): Payer: Self-pay | Admitting: Internal Medicine

## 2015-10-17 MED ORDER — SUCRALFATE 1 GM/10ML PO SUSP: 1.0000 g | Freq: Three times a day (TID) | ORAL | 0 refills | Status: DC

## 2015-10-17 NOTE — Telephone Encounter (Signed)
Can try carafate for pp abdominal pain if she would like. RX sent.

## 2015-10-17 NOTE — Telephone Encounter (Signed)
pts daughter called- left voicemail- her mother was discharged from the hospital today. She said she wasn't given anything for pain. The pt has appt here on 10/29/15. They want to know if she can either be seen sooner or if we can give her something for pain until she can be seen in the office.   [place]

## 2015-10-17 NOTE — Telephone Encounter (Signed)
pts daughter is aware.

## 2015-10-21 ENCOUNTER — Encounter: Payer: Self-pay | Admitting: "Endocrinology

## 2015-10-21 ENCOUNTER — Ambulatory Visit (INDEPENDENT_AMBULATORY_CARE_PROVIDER_SITE_OTHER): Payer: 59 | Admitting: "Endocrinology

## 2015-10-21 VITALS — BP 168/83 | HR 92 | Ht 66.0 in | Wt 200.8 lb

## 2015-10-21 DIAGNOSIS — E1165 Type 2 diabetes mellitus with hyperglycemia: Secondary | ICD-10-CM | POA: Diagnosis not present

## 2015-10-21 DIAGNOSIS — E782 Mixed hyperlipidemia: Secondary | ICD-10-CM

## 2015-10-21 DIAGNOSIS — Z794 Long term (current) use of insulin: Secondary | ICD-10-CM | POA: Diagnosis not present

## 2015-10-21 DIAGNOSIS — I10 Essential (primary) hypertension: Secondary | ICD-10-CM | POA: Diagnosis not present

## 2015-10-21 DIAGNOSIS — IMO0002 Reserved for concepts with insufficient information to code with codable children: Secondary | ICD-10-CM

## 2015-10-21 DIAGNOSIS — E1122 Type 2 diabetes mellitus with diabetic chronic kidney disease: Secondary | ICD-10-CM

## 2015-10-21 DIAGNOSIS — Z91199 Patient's noncompliance with other medical treatment and regimen due to unspecified reason: Secondary | ICD-10-CM | POA: Insufficient documentation

## 2015-10-21 DIAGNOSIS — Z9119 Patient's noncompliance with other medical treatment and regimen: Secondary | ICD-10-CM

## 2015-10-21 DIAGNOSIS — N183 Chronic kidney disease, stage 3 (moderate): Secondary | ICD-10-CM

## 2015-10-21 NOTE — Patient Instructions (Signed)

## 2015-10-21 NOTE — Progress Notes (Signed)
Subjective:    Patient ID: Kathryn Beck, female    DOB: 1955/07/21, PCP Tula Nakayama, MD   Past Medical History:  Diagnosis Date  . Anemia   . Axillary masses    Soft tissue - status post excision  . Back pain   . Essential hypertension   . Mixed hyperlipidemia   . Obesity   . Sleep apnea    Noncompliant with CPAP  . Type 2 diabetes mellitus, uncontrolled ([place])    Past Surgical History:  Procedure Laterality Date  . ABDOMINAL HYSTERECTOMY    . ESOPHAGEAL DILATION N/A 10/13/2015   Procedure: ESOPHAGEAL DILATION;  Surgeon: Rogene Houston, MD;  Location: [facility]ITE;  Service: Endoscopy;  Laterality: N/A;  . ESOPHAGOGASTRODUODENOSCOPY N/A 10/13/2015   Procedure: ESOPHAGOGASTRODUODENOSCOPY (EGD);  Surgeon: Rogene Houston, MD;  Location: [facility] SUITE;  Service: Endoscopy;  Laterality: N/A;  . FOOT SURGERY Bilateral   . LUNG BIOPSY    . MASS EXCISION Right 01/09/2013   Procedure: EXCISION OF NEOPLASM OF RIGHT  AXILLA  AND EXCISION OF NEOPLASM OF LEFT AXILLA;  Surgeon: Jamesetta So, MD;  Location: [facility] ORS;  Service: General;  Laterality: Right;  procedure end @ 08:23   Social History   Social History  . Marital status: Married    Spouse name: N/A  . Number of children: N/A  . Years of education: N/A   Social History Main Topics  . Smoking status: Never Smoker  . Smokeless tobacco: Never Used  . Alcohol use No  . Drug use: No  . Sexual activity: Not Asked   Other Topics Concern  . None   Social History Narrative  . None   Outpatient Encounter Prescriptions as of 10/21/2015  Medication Sig  . ACCU-CHEK FASTCLIX LANCETS MISC Use as instructed three times daily dx E11.65  . acetaminophen (TYLENOL) 325 MG tablet Take 650 mg by mouth every 6 (six) hours as needed for mild pain.  Marland Kitchen allopurinol (ZYLOPRIM) 300 MG tablet TAKE 1 TABLET (300 MG TOTAL) BY MOUTH DAILY.  Marland Kitchen amLODipine (NORVASC) 10 MG tablet Take 1 tablet by mouth  daily  . azelastine (ASTELIN) 0.1 %  nasal spray Place 2 sprays into both nostrils 2 (two) times daily. Use in each nostril as directed  . Blood Glucose Monitoring Suppl (ONETOUCH VERIO IQ SYSTEM) w/Device KIT Three times daily testing dx E11.65  . cloNIDine (CATAPRES) 0.1 MG tablet Take 1 tablet by mouth  every evening at 9pm for  blood pressure  . cyclobenzaprine (FLEXERIL) 10 MG tablet Take 1 tablet by mouth  every night at bedtime as  needed for muscle spasm(s)  . ergocalciferol (VITAMIN D2) 50000 UNITS capsule Take 1 capsule (50,000 Units total) by mouth once a week. One capsule once weekly  . gabapentin (NEURONTIN) 100 MG capsule Take 1 capsule (100 mg total) by mouth 3 (three) times daily.  Marland Kitchen glucose blood test strip One touch verio strips, three times daily testing  . Insulin Detemir (LEVEMIR FLEXTOUCH [place]) Inject 60 Units into the skin at bedtime.  . insulin lispro (HUMALOG) 100 UNIT/ML injection Inject 10-16 Units into the skin See admin instructions. Twice daily as needed before meals. Patient uses according to sliding scale at home  . Insulin Pen Needle (B-D UF III MINI PEN NEEDLES) 31G X 5 MM MISC Use as instructed three times daily  . omeprazole (PRILOSEC) 40 MG capsule Take 1 capsule (40 mg total) by mouth daily.  . ondansetron (ZOFRAN ODT)  8 MG disintegrating tablet Take 1 tablet (8 mg total) by mouth every 8 (eight) hours as needed for nausea or vomiting.  . ranitidine (ZANTAC) 150 MG tablet Take 1 tablet (150 mg total) by mouth 2 (two) times daily.  Marland Kitchen spironolactone (ALDACTONE) 25 MG tablet Take 1 tablet by mouth two  times daily  . sucralfate (CARAFATE) 1 GM/10ML suspension Take 10 mLs (1 g total) by mouth 4 (four) times daily -  with meals and at bedtime.  . triamterene-hydrochlorothiazide (MAXZIDE) 75-50 MG tablet Take 1 tablet by mouth  daily  . valsartan (DIOVAN) 320 MG tablet Take 1 tablet by mouth  daily  . vitamin B-12 (CYANOCOBALAMIN) 1000 MCG tablet Take 1,000 mcg by mouth daily.  . [DISCONTINUED] Insulin  Detemir (LEVEMIR FLEXTOUCH) 100 UNIT/ML Pen INJECT 90 UNITS INTO THE SKIN DAILY AT 10 PM.  . [DISCONTINUED] INVOKANA 100 MG TABS tablet TAKE 1 TABLET EVERY DAY   No facility-administered encounter medications on file as of 10/21/2015.    ALLERGIES: Allergies  Allergen Reactions  . Ace Inhibitors Anaphylaxis and Swelling  . Penicillins Itching and Swelling    Has patient had a PCN reaction causing immediate rash, facial/tongue/throat swelling, SOB or lightheadedness with hypotension: Yes Has patient had a PCN reaction causing severe rash involving mucus membranes or skin necrosis: No Has patient had a PCN reaction that required hospitalization No Has patient had a PCN reaction occurring within the last 10 years: No If all of the above answers are "NO", then may proceed with Cephalosporin use.   . Statins Other (See Comments)    elevated LFT's   VACCINATION STATUS: Immunization History  Administered Date(s) Administered  . H1N1 01/16/2008  . Influenza Whole 12/28/2004, 02/24/2006, 12/07/2006, 02/01/2008, 01/28/2009, 11/25/2010  . Influenza,inj,Quad PF,36+ Mos 12/22/2012, 03/12/2015  . Pneumococcal Polysaccharide-23 01/28/2009, 07/25/2015  . Td 11/12/2003  . Tdap 10/02/2014    Diabetes  She presents for her follow-up diabetic visit. She has type 2 diabetes mellitus. Onset time: Kathryn Beck card was diagnosed with type 2 diabetes at approximate age of 27 years. Her disease course has been worsening (She saw me in 2015 at which time she was given basal/bolus insulin regimen. She subsequently no showed to clinic visits for various reasons including insurance issues.). There are no hypoglycemic associated symptoms. Pertinent negatives for hypoglycemia include no confusion, headaches, pallor or seizures. Associated symptoms include blurred vision, fatigue, polydipsia and polyuria. Pertinent negatives for diabetes include no chest pain and no polyphagia. There are no hypoglycemic complications.  Symptoms are worsening. Diabetic complications include nephropathy. Risk factors for coronary artery disease include diabetes mellitus, dyslipidemia, family history, obesity, hypertension and sedentary lifestyle. Current diabetic treatment includes insulin injections (She claims to have been using Levemir 90 units at bedtime and NovoLog 25 units 3 times a day before meals. However, she did not bring any meter nor logs to review today. And she admits that she does not monitor blood glucose regularly.). Her weight is stable. She is following a generally unhealthy diet. When asked about meal planning, she reported none. She has not had a previous visit with a dietitian. She never participates in exercise. An ACE inhibitor/angiotensin II receptor blocker is being taken. Eye exam is current.  Hypertension  This is a chronic problem. The current episode started more than 1 year ago. The problem is uncontrolled. Associated symptoms include blurred vision. Pertinent negatives include no chest pain, headaches, palpitations or shortness of breath. Risk factors for coronary artery disease include diabetes mellitus,  obesity and family history. Past treatments include angiotensin blockers. Compliance problems include diet, exercise and psychosocial issues.  Hypertensive end-organ damage includes kidney disease.     Review of Systems  Constitutional: Positive for fatigue. Negative for activity change, chills, fever and unexpected weight change.  HENT: Negative for trouble swallowing and voice change.   Eyes: Positive for blurred vision. Negative for visual disturbance.  Respiratory: Negative for cough, shortness of breath and wheezing.   Cardiovascular: Negative for chest pain, palpitations and leg swelling.  Gastrointestinal: Negative for diarrhea, nausea and vomiting.  Endocrine: Positive for polydipsia and polyuria. Negative for cold intolerance, heat intolerance and polyphagia.  Genitourinary: Positive for  frequency. Negative for dysuria and flank pain.  Musculoskeletal: Negative for arthralgias and myalgias.  Skin: Negative for color change, pallor, rash and wound.  Neurological: Negative for seizures and headaches.  Psychiatric/Behavioral: Negative for confusion, dysphoric mood and suicidal ideas.  All other systems reviewed and are negative.   Objective:    BP (!) 168/83 (BP Location: Right Arm, Patient Position: Sitting, Cuff Size: Large)   Pulse 92   Ht 5' 6"  (1.676 m)   Wt 200 lb 12.8 oz (91.1 kg)   SpO2 98%   BMI 32.41 kg/m   Wt Readings from Last 3 Encounters:  10/21/15 200 lb 12.8 oz (91.1 kg)  10/10/15 199 lb 3.2 oz (90.4 kg)  10/09/15 199 lb 12.8 oz (90.6 kg)    Physical Exam  Constitutional: She is oriented to person, place, and time. She appears well-developed.  HENT:  Head: Normocephalic and atraumatic.  Eyes: EOM are normal.  Neck: Normal range of motion. Neck supple. No tracheal deviation present. No thyromegaly present.  Cardiovascular: Normal rate and regular rhythm.   Pulmonary/Chest: Effort normal and breath sounds normal.  Abdominal: Soft. Bowel sounds are normal. There is no tenderness. There is no guarding.  Musculoskeletal: Normal range of motion. She exhibits no edema.  Neurological: She is alert and oriented to person, place, and time. She has normal reflexes. No cranial nerve deficit. Coordination normal.  Skin: Skin is warm and dry. No rash noted. No erythema. No pallor.  Psychiatric: She has a normal mood and affect. Judgment normal.     CMP ( most recent) CMP     Component Value Date/Time   NA 137 10/15/2015 0549   K 4.2 10/15/2015 0549   CL 112 (H) 10/15/2015 0549   CO2 22 10/15/2015 0549   GLUCOSE 192 (H) 10/15/2015 0549   BUN 20 10/15/2015 0549   CREATININE 1.53 (H) 10/15/2015 0549   CREATININE 2.14 (H) 10/07/2015 1425   CALCIUM 8.4 (L) 10/15/2015 0549   PROT 6.0 (L) 10/14/2015 0627   ALBUMIN 2.7 (L) 10/14/2015 0627   AST 21  10/14/2015 0627   ALT 25 10/14/2015 0627   ALKPHOS 74 10/14/2015 0627   BILITOT 0.3 10/14/2015 0627   GFRNONAA 36 (L) 10/15/2015 0549   GFRNONAA 42 (L) 07/23/2015 1115   GFRAA 42 (L) 10/15/2015 0549   GFRAA 49 (L) 07/23/2015 1115     Diabetic Labs (most recent): Lab Results  Component Value Date   HGBA1C 10.2 (H) 10/07/2015   HGBA1C 10.3 (H) 07/25/2015   HGBA1C 10.6 (H) 03/12/2015     Lipid Panel ( most recent) Lipid Panel     Component Value Date/Time   CHOL 304 (H) 10/07/2015 1422   TRIG 711 (H) 10/07/2015 1422   HDL 54 10/07/2015 1422   CHOLHDL 5.6 (H) 10/07/2015 1422   VLDL NOT  CALC 10/07/2015 1422   LDLCALC NOT CALC 10/07/2015 1422   LDLDIRECT 90 09/26/2014 0729         Assessment & Plan:   1. 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 These are all discussed in detail with the patient.  Patient came without the meter nor blood glucose profile, and  recent A1c of 10.2 %.    Recent labs reviewed.   - I have re-counseled the patient on diet management and weight loss  by adopting a carbohydrate restricted / protein rich  Diet.  - Suggestion is made for patient to avoid simple carbohydrates   from their diet including Cakes , Desserts, Ice Cream,  Soda (  diet and regular) , Sweet Tea , Candies,  Chips, Cookies, Artificial Sweeteners,   and "Sugar-free" Products .  This will help patient to have stable blood glucose profile and potentially avoid unintended  Weight gain.  - Patient is advised to stick to a routine mealtimes to eat 3 meals  a day and avoid unnecessary snacks ( to snack only to correct hypoglycemia).  - The patient  will be  scheduled with Jearld Fenton, RDN, CDE for  individualized DM education.  - I have approached patient with the following individualized plan to manage diabetes and patient agrees.  - Since she did not bring any meter nor log to review, it would be difficult to continue with the reported blah dose of insulin to her. - Advised her to lower Levemir to 60 units daily at bedtime, lower NovoLog to 10 units 3 times a day before meals for pre-meal BG readings of 90-119m/dl, plus patient specific correction dose of rapid acting insulin  for unexpected hyperglycemia above 1555mdl, associated with strict monitoring of glucose  AC and HS. - Patient is warned not to take insulin without proper monitoring per orders. -Adjustment parameters are given for hypo and hyperglycemia in writing. - Her care was discussed with her adult daughter , who is an LPN, in the room who is offering to help. -Patient is encouraged to call clinic for blood glucose levels less than 70 or above 300 mg /dl.  -Patient is not a candidate for SGLT 2 inhibitors nor metformin due to CKD. - I write her to discontinue Invokana. - Patient will be considered for incretin therapy as appropriate next visit. - Patient specific target  for A1c; LDL, HDL, Triglycerides, and  Waist Circumference were discussed in detail.  2) BP/HTN:  Uncontrolled. Continue current medications including ARB. 3) Lipids/HPL: Uncontrolled, due to high triglycerides in 700s her LDL was not calculated. She will need statin therapy. We'll consider during her next visit. 4)  Weight/Diet: CDE consult in progress, exercise, and carbohydrates information provided.  5) Chronic Care/Health Maintenance:  -Patient is on ARB medications and encouraged to continue to follow up with Ophthalmology, Podiatrist at least yearly or according to recommendations, and advised to  stay away from smoking. I have recommended yearly flu vaccine and pneumonia vaccination at least every 5 years; moderate intensity exercise for up to  150 minutes weekly; and  sleep for at least 7 hours a day.  - 25 minutes of time was spent on the care of this patient , 50% of which was applied for  counseling on diabetes complications and their preventions.  - I advised patient to maintain close follow up with Tula Nakayama, MD for primary care needs.  Patient is asked to bring meter and  blood glucose logs during their next visit.   Follow up plan: -Return in about 1 week (around 10/28/2015) for follow up with meter and logs- no labs.  Glade Lloyd, MD Phone: [phone]  Fax: [phone]   10/21/2015, 5:23 PM

## 2015-10-22 ENCOUNTER — Ambulatory Visit (INDEPENDENT_AMBULATORY_CARE_PROVIDER_SITE_OTHER): Payer: 59 | Admitting: Family Medicine

## 2015-10-22 ENCOUNTER — Encounter (INDEPENDENT_AMBULATORY_CARE_PROVIDER_SITE_OTHER): Payer: Self-pay

## 2015-10-22 VITALS — BP 146/82 | Resp 18 | Ht 66.0 in | Wt 199.1 lb

## 2015-10-22 DIAGNOSIS — Z09 Encounter for follow-up examination after completed treatment for conditions other than malignant neoplasm: Secondary | ICD-10-CM | POA: Diagnosis not present

## 2015-10-22 DIAGNOSIS — E1165 Type 2 diabetes mellitus with hyperglycemia: Secondary | ICD-10-CM

## 2015-10-22 DIAGNOSIS — M62838 Other muscle spasm: Secondary | ICD-10-CM

## 2015-10-22 DIAGNOSIS — Z794 Long term (current) use of insulin: Secondary | ICD-10-CM

## 2015-10-22 DIAGNOSIS — E559 Vitamin D deficiency, unspecified: Secondary | ICD-10-CM

## 2015-10-22 DIAGNOSIS — E782 Mixed hyperlipidemia: Secondary | ICD-10-CM

## 2015-10-22 DIAGNOSIS — N183 Chronic kidney disease, stage 3 (moderate): Secondary | ICD-10-CM

## 2015-10-22 DIAGNOSIS — G4730 Sleep apnea, unspecified: Secondary | ICD-10-CM

## 2015-10-22 DIAGNOSIS — I10 Essential (primary) hypertension: Secondary | ICD-10-CM

## 2015-10-22 DIAGNOSIS — E1122 Type 2 diabetes mellitus with diabetic chronic kidney disease: Secondary | ICD-10-CM

## 2015-10-22 DIAGNOSIS — IMO0002 Reserved for concepts with insufficient information to code with codable children: Secondary | ICD-10-CM

## 2015-10-22 DIAGNOSIS — R1084 Generalized abdominal pain: Secondary | ICD-10-CM

## 2015-10-22 MED ORDER — CYCLOBENZAPRINE HCL 10 MG PO TABS: 10.0000 mg | ORAL_TABLET | Freq: Every day | ORAL | 3 refills | Status: DC

## 2015-10-22 NOTE — Assessment & Plan Note (Addendum)
Nocturnal back spasm, start bedtime muscle relaxant

## 2015-10-22 NOTE — Patient Instructions (Addendum)
F/u in 3 month, call if you need me before  PLEASE follow healthy eating plan, so that your blood sugar and cholesterol improve, and keep apptswith Dr Dorris Fetch  Muscle relaxant for bedtime use only  Fasting lipid, cmp and EGFr, Vit D and K total for next visit please   Please use CPAP machine at night   Thank you  for choosing [facility]. We consider it a privelige to serve you.  Delivering excellent health care in a caring and  compassionate way is our goal.  Partnering with you,  so that together we can achieve this goal is our strategy.

## 2015-10-27 ENCOUNTER — Encounter: Payer: Self-pay | Admitting: Family Medicine

## 2015-10-27 DIAGNOSIS — Z09 Encounter for follow-up examination after completed treatment for conditions other than malignant neoplasm: Secondary | ICD-10-CM | POA: Insufficient documentation

## 2015-10-27 NOTE — Assessment & Plan Note (Addendum)
Admitted with dx of abdominal pain from 7/20 to 7/26, d/d is acute pancreatitis, further GI eval waranted as still unclear . Reports improved abdominal pain

## 2015-10-27 NOTE — Assessment & Plan Note (Addendum)
Needs to commit to CPAP usage, currently inadequately treated due to non compliance

## 2015-10-27 NOTE — Assessment & Plan Note (Signed)
Kathryn Beck is reminded of the importance of commitment to daily physical activity for 30 minutes or more, as able and the need to limit carbohydrate intake to 30 to 60 grams per meal to help with blood sugar control.   The need to take medication as prescribed, test blood sugar as directed, and to call between visits if there is a concern that blood sugar is uncontrolled is also discussed.  Uncontrolled followed by endo  Kathryn Beck is reminded of the importance of daily foot exam, annual eye examination, and good blood sugar, blood pressure and cholesterol control.  Diabetic Labs Latest Ref Rng & Units 10/15/2015 10/14/2015 10/13/2015 10/12/2015 10/11/2015  HbA1c <5.7 % - - - - -  Microalbumin Not Estab. ug/mL - - - - -  Micro/Creat Ratio 0.0 - 30.0 mg/g creat - - - - -  Chol 125 - 200 mg/dL - - - - -  HDL >=46 mg/dL - - - - -  Calc LDL <130 mg/dL - - - - -  Triglycerides <150 mg/dL - - - - -  Creatinine 0.44 - 1.00 mg/dL 1.53(H) 1.68(H) 1.79(H) 1.77(H) 1.80(H)   BP/Weight 10/22/2015 10/21/2015 10/16/2015 10/10/2015 10/09/2015 07/25/2015 [id number]  Systolic BP [id number] - [id number]  Diastolic BP 82 83 77 - 86 82 84  Wt. (Lbs) 199.12 200.8 - 199.2 199.8 201 198  BMI 32.14 32.41 - 32.17 32.26 32.46 31.97   Foot/eye exam completion dates Latest Ref Rng & Units 03/12/2015 04/27/2014  Eye Exam No Retinopathy - No Retinopathy  Foot Form Completion - Done -

## 2015-10-27 NOTE — Assessment & Plan Note (Signed)
Needs to commit to regular use of CPAP machine and control of blood sugar

## 2015-10-27 NOTE — Assessment & Plan Note (Signed)
Improved however GI evaluation is ongoing , pt encouraged to keep f/u next week, and she intends to do so

## 2015-10-27 NOTE — Progress Notes (Signed)
Kathryn Beck     MRN: [id number]      DOB: 04-09-1955   HPI Kathryn Beck  Patient in for follow up of recent hospitalization. Discharge summary, and laboratory and radiology data are reviewed, and any questions or concerns about recent hospitalization are discussed. Specific issues requiring follow up are specifically addressed. C/o uncontrolled back spasm esp at nigthj, disturbing sleep  ROS Denies recent fever or chills. Denies sinus pressure, nasal congestion, ear pain or sore throat. Denies chest congestion, productive cough or wheezing. Denies chest pains, palpitations and leg swelling Denies abdominal pain, nausea, vomiting,diarrhea or constipation.   Denies dysuria, frequency, hesitancy or incontinence.  Denies headaches, seizures, numbness, or tingling.  Denies skin break down or rash.   PE  BP (!) 146/82   Resp 18   Ht 5\' 6"  (1.676 m)   Wt 199 lb 1.9 oz (90.3 kg)   SpO2 97%   BMI 32.14 kg/m   Patient alert and oriented and in no cardiopulmonary distress.  HEENT: No facial asymmetry, EOMI,   oropharynx pink and moist.  Neck supple no JVD, no mass.  Chest: Clear to auscultation bilaterally.  CVS: S1, S2 no murmurs, no S3.Regular rate.  ABD: Soft non tender.   Ext: No edema  MS: Adequate though reduced  ROM spine, normal in  shoulders, hips and knees.  Skin: Intact, no ulcerations or rash noted.  Psych: Good eye contact, normal affect. Memory intact not anxious or depressed appearing.  CNS: CN 2-12 intact, power,  normal throughout.no focal deficits noted.   Assessment & Plan  Muscle spasm Nocturnal back spasm, start bedtime muscle relaxant  Hospital discharge follow-up Admitted with dx of abdominal pain from 7/20 to 7/26, d/d is acute pancreatitis, further GI eval waranted as still unclear . Reports improved abdominal pain  Sleep apnea Needs to commit to CPAP usage, currently inadequately treated due to non compliance  Essential  hypertension Sub optimal control, no change in medication  Uncontrolled type 2 diabetes mellitus with stage 3 chronic kidney disease ([place]) Kathryn Beck is reminded of the importance of commitment to daily physical activity for 30 minutes or more, as able and the need to limit carbohydrate intake to 30 to 60 grams per meal to help with blood sugar control.   The need to take medication as prescribed, test blood sugar as directed, and to call between visits if there is a concern that blood sugar is uncontrolled is also discussed.  Uncontrolled followed by endo  Kathryn Beck is reminded of the importance of daily foot exam, annual eye examination, and good blood sugar, blood pressure and cholesterol control.  Diabetic Labs Latest Ref Rng & Units 10/15/2015 10/14/2015 10/13/2015 10/12/2015 10/11/2015  HbA1c <5.7 % - - - - -  Microalbumin Not Estab. ug/mL - - - - -  Micro/Creat Ratio 0.0 - 30.0 mg/g creat - - - - -  Chol 125 - 200 mg/dL - - - - -  HDL >=46 mg/dL - - - - -  Calc LDL <130 mg/dL - - - - -  Triglycerides <150 mg/dL - - - - -  Creatinine 0.44 - 1.00 mg/dL 1.53(H) 1.68(H) 1.79(H) 1.77(H) 1.80(H)   BP/Weight 10/22/2015 10/21/2015 10/16/2015 10/10/2015 10/09/2015 07/25/2015 [id number]  Systolic BP 123456 [id number] - [id number]  Diastolic BP 82 83 77 - 86 82 84  Wt. (Lbs) 199.12 200.8 - 199.2 199.8 201 198  BMI 32.14 32.41 - 32.17 32.26 32.46 31.97   Foot/eye  exam completion dates Latest Ref Rng & Units 03/12/2015 04/27/2014  Eye Exam No Retinopathy - No Retinopathy  Foot Form Completion - Done -        Mixed hyperlipidemia Hyperlipidemia:Low fat diet discussed and encouraged.   Lipid Panel  Lab Results  Component Value Date   CHOL 304 (H) 10/07/2015   HDL 54 10/07/2015   LDLCALC NOT CALC 10/07/2015   LDLDIRECT 90 09/26/2014   TRIG 711 (H) 10/07/2015   CHOLHDL 5.6 (H) 10/07/2015  need to reduce fried and fatty food   FATIGUE Needs to commit to regular use of CPAP machine and control  of blood sugar  Abdominal pain Improved however GI evaluation is ongoing , pt encouraged to keep f/u next week, and she intends to do so

## 2015-10-27 NOTE — Assessment & Plan Note (Signed)
Sub optimal control, no change in medication

## 2015-10-27 NOTE — Assessment & Plan Note (Signed)
Hyperlipidemia:Low fat diet discussed and encouraged.   Lipid Panel  Lab Results  Component Value Date   CHOL 304 (H) 10/07/2015   HDL 54 10/07/2015   LDLCALC NOT CALC 10/07/2015   LDLDIRECT 90 09/26/2014   TRIG 711 (H) 10/07/2015   CHOLHDL 5.6 (H) 10/07/2015  need to reduce fried and fatty food

## 2015-10-28 ENCOUNTER — Other Ambulatory Visit: Payer: Self-pay | Admitting: Family Medicine

## 2015-10-29 ENCOUNTER — Ambulatory Visit (INDEPENDENT_AMBULATORY_CARE_PROVIDER_SITE_OTHER): Payer: 59 | Admitting: "Endocrinology

## 2015-10-29 ENCOUNTER — Encounter: Payer: Self-pay | Admitting: "Endocrinology

## 2015-10-29 ENCOUNTER — Ambulatory Visit (INDEPENDENT_AMBULATORY_CARE_PROVIDER_SITE_OTHER): Payer: 59 | Admitting: Gastroenterology

## 2015-10-29 ENCOUNTER — Encounter: Payer: Self-pay | Admitting: Gastroenterology

## 2015-10-29 VITALS — BP 160/88 | HR 77 | Temp 98.3°F | Ht 66.0 in | Wt 200.2 lb

## 2015-10-29 VITALS — BP 162/89 | HR 86 | Ht 66.0 in | Wt 201.0 lb

## 2015-10-29 DIAGNOSIS — Z9119 Patient's noncompliance with other medical treatment and regimen: Secondary | ICD-10-CM

## 2015-10-29 DIAGNOSIS — E1165 Type 2 diabetes mellitus with hyperglycemia: Secondary | ICD-10-CM | POA: Diagnosis not present

## 2015-10-29 DIAGNOSIS — Z794 Long term (current) use of insulin: Secondary | ICD-10-CM

## 2015-10-29 DIAGNOSIS — Z91199 Patient's noncompliance with other medical treatment and regimen due to unspecified reason: Secondary | ICD-10-CM

## 2015-10-29 DIAGNOSIS — N183 Chronic kidney disease, stage 3 (moderate): Secondary | ICD-10-CM

## 2015-10-29 DIAGNOSIS — K2970 Gastritis, unspecified, without bleeding: Secondary | ICD-10-CM

## 2015-10-29 DIAGNOSIS — I10 Essential (primary) hypertension: Secondary | ICD-10-CM

## 2015-10-29 DIAGNOSIS — E782 Mixed hyperlipidemia: Secondary | ICD-10-CM | POA: Diagnosis not present

## 2015-10-29 DIAGNOSIS — R1084 Generalized abdominal pain: Secondary | ICD-10-CM | POA: Diagnosis not present

## 2015-10-29 DIAGNOSIS — IMO0002 Reserved for concepts with insufficient information to code with codable children: Secondary | ICD-10-CM

## 2015-10-29 DIAGNOSIS — E1122 Type 2 diabetes mellitus with diabetic chronic kidney disease: Secondary | ICD-10-CM | POA: Diagnosis not present

## 2015-10-29 MED ORDER — PANTOPRAZOLE SODIUM 40 MG PO TBEC: 40.0000 mg | DELAYED_RELEASE_TABLET | Freq: Every day | ORAL | 3 refills | Status: DC

## 2015-10-29 MED ORDER — DICYCLOMINE HCL 10 MG PO CAPS: 10.0000 mg | ORAL_CAPSULE | Freq: Three times a day (TID) | ORAL | 3 refills | Status: DC

## 2015-10-29 NOTE — Patient Instructions (Signed)

## 2015-10-29 NOTE — Patient Instructions (Signed)
I have sent in Protonix to take once each morning, 30 minutes before breakfast.   I sent in Bentyl to take as needed, 30 minutes before meals and at bedtime.  You may stop Carafate if it is not helping.  Please complete the stool sample. If it is positive, we will proceed with a colonoscopy.   We will see you back in 3 months regardless!

## 2015-10-29 NOTE — Progress Notes (Signed)
Referring Provider: Fayrene Helper, MD Primary Care Physician:  Tula Nakayama, MD  Primary GI: Dr. Oneida Alar   Chief Complaint  Patient presents with  . Hospitalization Follow-up    abd pain     HPI:   Kathryn Beck is a 60 y.o. female presenting today in hospital follow-up, admitted with abdominal pain late July and found to have non-specific elevated lipase (highest 133)  but without findings of acute pancreatitis on non-contrast CT. No elevated LFTs. EGD with Dr. Laural Golden: chronic gastritis on path, no H.pylori. Empiric dilation completed due to reports of dysphagia. HIDA while inpatient noted normal EF of 82%. Last US abdomen in 2012 with fatty liver.    Cramps sometimes worse with eating. Sometimes will turn a certain way and will catch it in her legs, hands, all over her body. Cramps all over abdomen, upper and bottom of belly. Sometimes with loose stool, sometimes cramping goes away. Sometimes with normal BMs. Usually every time she eats, within 20-30 minutes will go to the bathroom. Very seldom constipated. Usually Bristol scale #4. Not taking Bentyl currently, as she was not discharged with this. Not on a PPI.  Noticed some improvement with Bentyl in the hospital. Stomach not as sore as it was while in the hospital. No rectal bleeding. Last colonoscopy over 10 years ago at [facility] by Dr. Oneida Alar was normal.   Past Medical History:  Diagnosis Date  . Anemia   . Axillary masses    Soft tissue - status post excision  . Back pain   . Essential hypertension   . Mixed hyperlipidemia   . Obesity   . Sleep apnea    Noncompliant with CPAP  . Type 2 diabetes mellitus, uncontrolled ([place])     Past Surgical History:  Procedure Laterality Date  . ABDOMINAL HYSTERECTOMY    . COLONOSCOPY  2008   Dr. Oneida Alar: normal   . ESOPHAGEAL DILATION N/A 10/13/2015   Procedure: ESOPHAGEAL DILATION;  Surgeon: Rogene Houston, MD;  Location: [facility];  Service: Endoscopy;  Laterality:  N/A;  . ESOPHAGOGASTRODUODENOSCOPY N/A 10/13/2015   Dr. Laural Golden: chronic gastritis on path, no H.pylori. Empiric dilation   . FOOT SURGERY Bilateral   . LUNG BIOPSY    . MASS EXCISION Right 01/09/2013   Procedure: EXCISION OF NEOPLASM OF RIGHT  AXILLA  AND EXCISION OF NEOPLASM OF LEFT AXILLA;  Surgeon: Jamesetta So, MD;  Location: [facility] ORS;  Service: General;  Laterality: Right;  procedure end @ 08:23    Current Outpatient Prescriptions  Medication Sig Dispense Refill  . allopurinol (ZYLOPRIM) 300 MG tablet Take 300 mg by mouth daily.  3  . amLODipine (NORVASC) 10 MG tablet     . azelastine (ASTELIN) 0.1 % nasal spray     . CARAFATE 1 GM/10ML suspension As needed    . cloNIDine (CATAPRES) 0.1 MG tablet daily.     . cyclobenzaprine (FLEXERIL) 10 MG tablet Take 1 tablet (10 mg total) by mouth at bedtime. 30 tablet 3  . Insulin Aspart (NOVOLOG FLEXPEN [place]) Inject 15-21 Units into the skin 3 (three) times daily with meals.    . Insulin Detemir (LEVEMIR FLEXTOUCH [place]) Inject 60 Units into the skin at bedtime.     . ondansetron (ZOFRAN-ODT) 8 MG disintegrating tablet every 8 (eight) hours as needed.     Marland Kitchen spironolactone (ALDACTONE) 25 MG tablet Take 25 mg by mouth 2 (two) times daily.    Marland Kitchen triamterene-hydrochlorothiazide (MAXZIDE) 75-50 MG  tablet daily.     . Vitamin D, Ergocalciferol, (DRISDOL) 50000 units CAPS capsule every 7 (seven) days.     Marland Kitchen allopurinol (ZYLOPRIM) 300 MG tablet TAKE 1 TABLET (300 MG TOTAL) BY MOUTH DAILY. 30 tablet 3  . dicyclomine (BENTYL) 10 MG capsule Take 1 capsule (10 mg total) by mouth 4 (four) times daily -  before meals and at bedtime. 120 capsule 3  . pantoprazole (PROTONIX) 40 MG tablet Take 1 tablet (40 mg total) by mouth daily. Take 30 minutes prior to breakfast each day. 90 tablet 3   No current facility-administered medications for this visit.     Allergies as of 10/29/2015 - Review Complete 10/29/2015  Allergen Reaction Noted  . Ace inhibitors Anaphylaxis  and Swelling 06/07/2007  . Penicillins Itching and Swelling 06/07/2007  . Statins Other (See Comments) 12/24/2012    No family history on file.  Social History   Social History  . Marital status: Married    Spouse name: N/A  . Number of children: N/A  . Years of education: N/A   Social History Main Topics  . Smoking status: Never Smoker  . Smokeless tobacco: Never Used  . Alcohol use No  . Drug use: No  . Sexual activity: Not Asked   Other Topics Concern  . None   Social History Narrative  . None    Review of Systems: As mentioned in HPI.   Physical Exam: BP (!) 160/88   Pulse 77   Temp 98.3 F (36.8 C) (Oral)   Ht 5\' 6"  (1.676 m)   Wt 200 lb 3.2 oz (90.8 kg)   BMI 32.31 kg/m  General:   Alert and oriented. No distress noted. Pleasant and cooperative.  Head:  Normocephalic and atraumatic. Eyes:  Conjuctiva clear without scleral icterus. Heart:  S1, S2 present without murmurs, rubs, or gallops. Regular rate and rhythm. Abdomen:  +BS, soft, non-tender and non-distended. No rebound or guarding. No HSM or masses noted. Msk:  Symmetrical without gross deformities. Normal posture. Extremities:  Without edema. Neurologic:  Alert and  oriented x4;  grossly normal neurologically. Psych:  Alert and cooperative. Normal mood and affect.

## 2015-10-29 NOTE — Assessment & Plan Note (Signed)
60 year old with non-specific elevated lipase while inpatient but without radiological findings of pancreatitis. Doubt acute pancreatitis. EGD with chronic gastritis, negative H.pylori. Empiric dilation with improved dysphagia symptoms. Currently not on a PPI. Seems to have an IBS presentation with abdominal cramping and bowel urgency, with improvement thereafter. Improvement in symptoms noted on  Bentyl, although she was not discharged with this.   1. Resume Bentyl before meals and at bedtime. 2. Resume Protonix once daily before breakfast 3. Check ifobt as her last colonoscopy was in 2008. If positive, proceed with colonoscopy with Dr. Oneida Alar (who performed her last procedure).  3 month return

## 2015-10-29 NOTE — Progress Notes (Signed)
Subjective:    Patient ID: Kathryn Beck, female    DOB: 1956/01/31, PCP Kathryn Nakayama, MD   Past Medical History:  Diagnosis Date  . Anemia   . Axillary masses    Soft tissue - status post excision  . Back pain   . Essential hypertension   . Mixed hyperlipidemia   . Obesity   . Sleep apnea    Noncompliant with CPAP  . Type 2 diabetes mellitus, uncontrolled ([place])    Past Surgical History:  Procedure Laterality Date  . ABDOMINAL HYSTERECTOMY    . ESOPHAGEAL DILATION N/A 10/13/2015   Procedure: ESOPHAGEAL DILATION;  Surgeon: Rogene Houston, MD;  [facility];  Service: Endoscopy;  Laterality: N/A;  . ESOPHAGOGASTRODUODENOSCOPY N/A 10/13/2015   Procedure: ESOPHAGOGASTRODUODENOSCOPY (EGD);  Surgeon: Rogene Houston, MD;  Location[facility];  Service: Endoscopy;  Laterality: N/A;  . FOOT SURGERY Bilateral   . LUNG BIOPSY    . MASS EXCISION Right 01/09/2013   Procedure: EXCISION OF NEOPLASM OF RIGHT  AXILLA  AND EXCISION OF NEOPLASM OF LEFT AXILLA;  Surgeon: Jamesetta So, MD;  Location: [facility] ORS;  Service: General;  Laterality: Right;  procedure end @ 08:23   Social History   Social History  . Marital status: Married    Spouse name: N/A  . Number of children: N/A  . Years of education: N/A   Social History Main Topics  . Smoking status: Never Smoker  . Smokeless tobacco: Never Used  . Alcohol use No  . Drug use: No  . Sexual activity: Not Asked   Other Topics Concern  . None   Social History Narrative  . None   Outpatient Encounter Prescriptions as of 10/29/2015  Medication Sig  . Insulin Aspart (NOVOLOG FLEXPEN [place]) Inject 15-21 Units into the skin 3 (three) times daily with meals.  . Insulin Detemir (LEVEMIR FLEXTOUCH [place]) Inject 70 Units into the skin at bedtime.  Marland Kitchen allopurinol (ZYLOPRIM) 300 MG tablet Take 300 mg by mouth daily.  Marland Kitchen allopurinol (ZYLOPRIM) 300 MG tablet TAKE 1 TABLET (300 MG TOTAL) BY MOUTH DAILY.  Marland Kitchen amLODipine (NORVASC) 10 MG  tablet   . azelastine (ASTELIN) 0.1 % nasal spray   . CARAFATE 1 GM/10ML suspension   . cloNIDine (CATAPRES) 0.1 MG tablet   . cyclobenzaprine (FLEXERIL) 10 MG tablet Take 1 tablet (10 mg total) by mouth at bedtime.  . ondansetron (ZOFRAN-ODT) 8 MG disintegrating tablet   . spironolactone (ALDACTONE) 25 MG tablet Take 25 mg by mouth 2 (two) times daily.  Marland Kitchen triamterene-hydrochlorothiazide (MAXZIDE) 75-50 MG tablet   . Vitamin D, Ergocalciferol, (DRISDOL) 50000 units CAPS capsule    No facility-administered encounter medications on file as of 10/29/2015.    ALLERGIES: Allergies  Allergen Reactions  . Ace Inhibitors Anaphylaxis and Swelling  . Penicillins Itching and Swelling    Has patient had a PCN reaction causing immediate rash, facial/tongue/throat swelling, SOB or lightheadedness with hypotension: Yes Has patient had a PCN reaction causing severe rash involving mucus membranes or skin necrosis: No Has patient had a PCN reaction that required hospitalization No Has patient had a PCN reaction occurring within the last 10 years: No If all of the above answers are "NO", then may proceed with Cephalosporin use.   . Statins Other (See Comments)    elevated LFT's   VACCINATION STATUS: Immunization History  Administered Date(s) Administered  . H1N1 01/16/2008  . Influenza Whole 12/28/2004, 02/24/2006, 12/07/2006, 02/01/2008, 01/28/2009,  11/25/2010  . Influenza,inj,Quad PF,36+ Mos 12/22/2012, 03/12/2015  . Pneumococcal Polysaccharide-23 01/28/2009, 07/25/2015  . Td 11/12/2003  . Tdap 10/02/2014    Diabetes  She presents for her follow-up diabetic visit. She has type 2 diabetes mellitus. Onset time: [name] card was diagnosed with type 2 diabetes at approximate age of 30 years. Her disease course has been stable (She saw me in 2015 at which time she was given basal/bolus insulin regimen. She subsequently no showed to clinic visits for various reasons including insurance issues.). There  are no hypoglycemic associated symptoms. Pertinent negatives for hypoglycemia include no confusion, headaches, pallor or seizures. Associated symptoms include blurred vision, fatigue, polydipsia and polyuria. Pertinent negatives for diabetes include no chest pain and no polyphagia. There are no hypoglycemic complications. Symptoms are stable. Diabetic complications include nephropathy. Risk factors for coronary artery disease include diabetes mellitus, dyslipidemia, family history, obesity, hypertension and sedentary lifestyle. Current diabetic treatment includes insulin injections (She claims to have been using Levemir 90 units at bedtime and NovoLog 25 units 3 times a day before meals. However, she did not bring any meter nor logs to review today. And she admits that she does not monitor blood glucose regularly.). Her weight is stable. She is following a generally unhealthy diet. When asked about meal planning, she reported none. She has not had a previous visit with a dietitian. She never participates in exercise. Her breakfast blood glucose range is generally >200 mg/dl. Her lunch blood glucose range is generally >200 mg/dl. Her dinner blood glucose range is generally >200 mg/dl. Her overall blood glucose range is >200 mg/dl. An ACE inhibitor/angiotensin II receptor blocker is being taken. Eye exam is current.  Hypertension  This is a chronic problem. The current episode started more than 1 year ago. The problem is uncontrolled. Associated symptoms include blurred vision. Pertinent negatives include no chest pain, headaches, palpitations or shortness of breath. Risk factors for coronary artery disease include diabetes mellitus, obesity and family history. Past treatments include angiotensin blockers. Compliance problems include diet, exercise and psychosocial issues.  Hypertensive end-organ damage includes kidney disease.     Review of Systems  Constitutional: Positive for fatigue. Negative for activity  change, chills, fever and unexpected weight change.  HENT: Negative for trouble swallowing and voice change.   Eyes: Positive for blurred vision. Negative for visual disturbance.  Respiratory: Negative for cough, shortness of breath and wheezing.   Cardiovascular: Negative for chest pain, palpitations and leg swelling.  Gastrointestinal: Negative for diarrhea, nausea and vomiting.  Endocrine: Positive for polydipsia and polyuria. Negative for cold intolerance, heat intolerance and polyphagia.  Genitourinary: Positive for frequency. Negative for dysuria and flank pain.  Musculoskeletal: Negative for arthralgias and myalgias.  Skin: Negative for color change, pallor, rash and wound.  Neurological: Negative for seizures and headaches.  Psychiatric/Behavioral: Negative for confusion, dysphoric mood and suicidal ideas.  All other systems reviewed and are negative.   Objective:    BP (!) 162/89   Pulse 86   Ht 5\' 6"  (1.676 m)   Wt 201 lb (91.2 kg)   BMI 32.44 kg/m   Wt Readings from Last 3 Encounters:  10/29/15 201 lb (91.2 kg)  10/22/15 199 lb 1.9 oz (90.3 kg)  10/21/15 200 lb 12.8 oz (91.1 kg)    Physical Exam  Constitutional: She is oriented to person, place, and time. She appears well-developed.  HENT:  Head: Normocephalic and atraumatic.  Eyes: EOM are normal.  Neck: Normal range of motion. Neck supple.  No tracheal deviation present. No thyromegaly present.  Cardiovascular: Normal rate and regular rhythm.   Pulmonary/Chest: Effort normal and breath sounds normal.  Abdominal: Soft. Bowel sounds are normal. There is no tenderness. There is no guarding.  Musculoskeletal: Normal range of motion. She exhibits no edema.  Neurological: She is alert and oriented to person, place, and time. She has normal reflexes. No cranial nerve deficit. Coordination normal.  Skin: Skin is warm and dry. No rash noted. No erythema. No pallor.  Psychiatric: She has a normal mood and affect. Judgment  normal.    CMP     Component Value Date/Time   NA 137 10/15/2015 0549   K 4.2 10/15/2015 0549   CL 112 (H) 10/15/2015 0549   CO2 22 10/15/2015 0549   GLUCOSE 192 (H) 10/15/2015 0549   BUN 20 10/15/2015 0549   CREATININE 1.53 (H) 10/15/2015 0549   CREATININE 2.14 (H) 10/07/2015 1425   CALCIUM 8.4 (L) 10/15/2015 0549   PROT 6.0 (L) 10/14/2015 0627   ALBUMIN 2.7 (L) 10/14/2015 0627   AST 21 10/14/2015 0627   ALT 25 10/14/2015 0627   ALKPHOS 74 10/14/2015 0627   BILITOT 0.3 10/14/2015 0627   GFRNONAA 36 (L) 10/15/2015 0549   GFRNONAA 42 (L) 07/23/2015 1115   GFRAA 42 (L) 10/15/2015 0549   GFRAA 49 (L) 07/23/2015 1115     Diabetic Labs (most recent): Lab Results  Component Value Date   HGBA1C 10.2 (H) 10/07/2015   HGBA1C 10.3 (H) 07/25/2015   HGBA1C 10.6 (H) 03/12/2015     Lipid Panel ( most recent) Lipid Panel     Component Value Date/Time   CHOL 304 (H) 10/07/2015 1422   TRIG 711 (H) 10/07/2015 1422   HDL 54 10/07/2015 1422   CHOLHDL 5.6 (H) 10/07/2015 1422   VLDL NOT CALC 10/07/2015 1422   LDLCALC NOT CALC 10/07/2015 1422   LDLDIRECT 90 09/26/2014 0729       Assessment & Plan:   1. 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 These are all discussed in detail with the patient.  Patient came without the meter nor blood glucose profile, and  recent A1c of 10.2 %.    Recent labs reviewed.   - I have re-counseled the patient on diet management and weight loss  by adopting a carbohydrate restricted / protein rich  Diet.  - Suggestion is made for patient to avoid simple carbohydrates   from their diet including Cakes , Desserts, Ice Cream,  Soda (  diet and regular) , Sweet Tea , Candies,   Chips, Cookies, Artificial Sweeteners,   and "Sugar-free" Products .  This will help patient to have stable blood glucose profile and potentially avoid unintended  Weight gain.  - Patient is advised to stick to a routine mealtimes to eat 3 meals  a day and avoid unnecessary snacks ( to snack only to correct hypoglycemia).  - The patient  will be  scheduled with Jearld Fenton, RDN, CDE for individualized DM education.  - I have approached patient with the following individualized plan to manage diabetes and patient agrees.   - Advised her to increase Levemir to 70 units daily at bedtime, increase NovoLog to 15 units 3 times a day before  meals for pre-meal BG readings of 90-150mg /dl, plus patient specific correction dose of rapid acting insulin  for unexpected hyperglycemia above 150mg /dl, associated with strict monitoring of glucose  AC and HS. - Patient is warned not to take insulin without proper monitoring per orders. -Adjustment parameters are given for hypo and hyperglycemia in writing. - Her care was discussed with her adult daughter , who is an LPN, in the room who is offering to help. -Patient is encouraged to call clinic for blood glucose levels less than 70 or above 300 mg /dl.  -Patient is not a candidate for SGLT 2 inhibitors nor metformin due to CKD. - I write her to discontinue Invokana. - Patient will be considered for incretin therapy as appropriate next visit. - Patient specific target  for A1c; LDL, HDL, Triglycerides, and  Waist Circumference were discussed in detail.  2) BP/HTN:  Uncontrolled. Continue current medications including ARB. 3) Lipids/HPL: Uncontrolled, due to high triglycerides in 700s her LDL was not calculated. She reports intolerance to statins. 4)  Weight/Diet: CDE consult in progress, exercise, and carbohydrates information provided.  5) Chronic Care/Health Maintenance:  -Patient is on ARB medications and encouraged to continue to follow up with  Ophthalmology, Podiatrist at least yearly or according to recommendations, and advised to  stay away from smoking. I have recommended yearly flu vaccine and pneumonia vaccination at least every 5 years; moderate intensity exercise for up to 150 minutes weekly; and  sleep for at least 7 hours a day.  - 25 minutes of time was spent on the care of this patient , 50% of which was applied for counseling on diabetes complications and their preventions.  - I advised patient to maintain close follow up with Kathryn Nakayama, MD for primary care needs.  Patient is asked to bring meter and  blood glucose logs during their next visit.   Follow up plan: -Return in about 4 weeks (around 11/26/2015) for follow up with meter and logs- no labs.  Glade Lloyd, MD Phone: [phone]  Fax: [phone]   10/29/2015, 10:58 AM

## 2015-10-30 ENCOUNTER — Encounter: Payer: 59 | Attending: "Endocrinology | Admitting: Nutrition

## 2015-10-30 VITALS — Ht 66.0 in | Wt 202.0 lb

## 2015-10-30 DIAGNOSIS — E118 Type 2 diabetes mellitus with unspecified complications: Secondary | ICD-10-CM

## 2015-10-30 DIAGNOSIS — E1165 Type 2 diabetes mellitus with hyperglycemia: Secondary | ICD-10-CM

## 2015-10-30 DIAGNOSIS — Z794 Long term (current) use of insulin: Secondary | ICD-10-CM

## 2015-10-30 DIAGNOSIS — Z713 Dietary counseling and surveillance: Secondary | ICD-10-CM | POA: Diagnosis not present

## 2015-10-30 DIAGNOSIS — E669 Obesity, unspecified: Secondary | ICD-10-CM | POA: Insufficient documentation

## 2015-10-30 DIAGNOSIS — E119 Type 2 diabetes mellitus without complications: Secondary | ICD-10-CM | POA: Diagnosis not present

## 2015-10-30 DIAGNOSIS — IMO0002 Reserved for concepts with insufficient information to code with codable children: Secondary | ICD-10-CM

## 2015-10-30 NOTE — Progress Notes (Signed)
  Medical Nutrition Therapy:  Appt start time: 1330 end time:  1430.  Assessment:  Primary concerns today: DM and Obesity. Her sister is here with her today. A1C 10.2%. LIves with her husband. She does the cooking.  Most foods are baked and broiled. 60 units of Levemir and 15 units of Novolog with meals plus sliding scale. FBS: 168 mg/dl. Eats 3 meals per day but usually forgets to eat some meals and forgets insulin at night or with meals if she falls asleep or eats out..  Was in the hospital a few weeks ago with stomach issues.  Motivate to make changes with diet.  DIet is inconsistent in carbs, protein and low in fresh fruits, vegetables and whole grains.   Lab Results  Component Value Date   HGBA1C 10.2 (H) 10/07/2015    Preferred Learning Style:  No preference indicated   Learning Readiness:  Not ready  Contemplating  Ready  Change in progress   MEDICATIONS:   DIETARY INTAKE:    24-hr recall:  B ( AM): Oatmeal or cereal or fruit, yogurt or eggs Snk ( AM): canteloupe L ( PM)  Chicken salad with water,  Snk ( PM): Misc... nabs D ( PM): Chicken wings, baked 6, canteloupe, water Snk ( PM): water; or yogurt Beverages:water  Usual physical activity: ADL   Estimated energy needs: 1500 calories 170  g carbohydrates 112 g protein 42 g fat  Progress Towards Goal(s):  In progress.   Nutritional Diagnosis:  NB-1.1 Food and nutrition-related knowledge deficit As related to Diabetes.  As evidenced by A1C 10.2%..    Intervention:  Nutrition and Diabetes education provided on My Plate, CHO counting, meal planning, portion sizes, timing of meals, avoiding snacks between meals unless having a low blood sugar, target ranges for A1C and blood sugars, signs/symptoms and treatment of hyper/hypoglycemia, monitoring blood sugars, taking medications as prescribed, benefits of exercising 30 minutes per day and prevention of complications of DM.  Goals .1. Follow My Plate 2 Eat  meals on time. 3. Eat 2-3 carb choices per meal 4. Take medications as prescribed. 5. Take insulin with you if you eat out along with your meter. 6. Drink only water 7. Get A1C down to 7% in three months 8. Exercise 30 minutes  Three times per work  Teaching Method Utilized:  S[employer] Hands on  Handouts given during visit include:  The Plate Method  Meal Plan  Diabetes Instructions.   Barriers to learning/adherence to lifestyle change: NOne  Demonstrated degree of understanding via:  Teach Back   Monitoring/Evaluation:  Dietary intake, exercise, meal planning, SBG, and body weight in 1 month(s).

## 2015-10-30 NOTE — Patient Instructions (Signed)
1. Follow My Plate 2 Eat meals on time. 3. Eat 2-3 carb choices per meal 4. Take medications as prescribed. 5. Take insulin with you if you eat out along with your meter. 6. Drink only water 7. Get A1C down to 7% in three months 8. Exercise 30 minutes  Three times per work

## 2015-10-30 NOTE — Progress Notes (Signed)
cc'ed to pcp °

## 2015-11-07 ENCOUNTER — Ambulatory Visit (INDEPENDENT_AMBULATORY_CARE_PROVIDER_SITE_OTHER): Payer: 59

## 2015-11-07 DIAGNOSIS — D539 Nutritional anemia, unspecified: Secondary | ICD-10-CM | POA: Diagnosis not present

## 2015-11-07 LAB — IFOBT (OCCULT BLOOD): IMMUNOLOGICAL FECAL OCCULT BLOOD TEST: NEGATIVE

## 2015-11-12 NOTE — Progress Notes (Signed)
Tried to call pt and Vm not set up. Mailed a letter with results.

## 2015-11-12 NOTE — Progress Notes (Signed)
ifobt is negative. Return in 3 months.

## 2015-11-27 ENCOUNTER — Encounter: Payer: 59 | Admitting: Family Medicine

## 2015-12-02 ENCOUNTER — Encounter: Payer: Self-pay | Admitting: Nutrition

## 2015-12-02 ENCOUNTER — Ambulatory Visit (INDEPENDENT_AMBULATORY_CARE_PROVIDER_SITE_OTHER): Payer: 59 | Admitting: "Endocrinology

## 2015-12-02 ENCOUNTER — Encounter: Payer: 59 | Attending: "Endocrinology | Admitting: Nutrition

## 2015-12-02 ENCOUNTER — Encounter: Payer: Self-pay | Admitting: "Endocrinology

## 2015-12-02 VITALS — Ht 66.0 in | Wt 206.0 lb

## 2015-12-02 VITALS — BP 160/76 | HR 69 | Ht 66.0 in | Wt 207.0 lb

## 2015-12-02 DIAGNOSIS — E119 Type 2 diabetes mellitus without complications: Secondary | ICD-10-CM | POA: Diagnosis not present

## 2015-12-02 DIAGNOSIS — E1122 Type 2 diabetes mellitus with diabetic chronic kidney disease: Secondary | ICD-10-CM | POA: Diagnosis not present

## 2015-12-02 DIAGNOSIS — Z91199 Patient's noncompliance with other medical treatment and regimen due to unspecified reason: Secondary | ICD-10-CM

## 2015-12-02 DIAGNOSIS — E1165 Type 2 diabetes mellitus with hyperglycemia: Secondary | ICD-10-CM

## 2015-12-02 DIAGNOSIS — Z9119 Patient's noncompliance with other medical treatment and regimen: Secondary | ICD-10-CM | POA: Diagnosis not present

## 2015-12-02 DIAGNOSIS — E669 Obesity, unspecified: Secondary | ICD-10-CM | POA: Diagnosis not present

## 2015-12-02 DIAGNOSIS — I10 Essential (primary) hypertension: Secondary | ICD-10-CM | POA: Diagnosis not present

## 2015-12-02 DIAGNOSIS — Z794 Long term (current) use of insulin: Secondary | ICD-10-CM

## 2015-12-02 DIAGNOSIS — Z713 Dietary counseling and surveillance: Secondary | ICD-10-CM | POA: Insufficient documentation

## 2015-12-02 DIAGNOSIS — N183 Chronic kidney disease, stage 3 (moderate): Secondary | ICD-10-CM

## 2015-12-02 DIAGNOSIS — IMO0002 Reserved for concepts with insufficient information to code with codable children: Secondary | ICD-10-CM

## 2015-12-02 DIAGNOSIS — E118 Type 2 diabetes mellitus with unspecified complications: Secondary | ICD-10-CM

## 2015-12-02 MED ORDER — INSULIN ASPART 100 UNIT/ML FLEXPEN: 15.0000 [IU] | PEN_INJECTOR | Freq: Three times a day (TID) | SUBCUTANEOUS | 2 refills | Status: DC

## 2015-12-02 NOTE — Progress Notes (Signed)
Subjective:    Patient ID: Kathryn Beck, female    DOB: Feb 06, 1956, PCP Tula Nakayama, MD   Past Medical History:  Diagnosis Date  . Anemia   . Axillary masses    Soft tissue - status post excision  . Back pain   . Essential hypertension   . Mixed hyperlipidemia   . Obesity   . Sleep apnea    Noncompliant with CPAP  . Type 2 diabetes mellitus, uncontrolled ([place])    Past Surgical History:  Procedure Laterality Date  . ABDOMINAL HYSTERECTOMY    . COLONOSCOPY  2008   Dr. Oneida Alar: normal   . ESOPHAGEAL DILATION N/A 10/13/2015   Procedure: ESOPHAGEAL DILATION;  Surgeon: Rogene Houston, MD;  Location: [facility];  Service: Endoscopy;  Laterality: N/A;  . ESOPHAGOGASTRODUODENOSCOPY N/A 10/13/2015   Dr. Laural Golden: chronic gastritis on path, no H.pylori. Empiric dilation   . FOOT SURGERY Bilateral   . LUNG BIOPSY    . MASS EXCISION Right 01/09/2013   Procedure: EXCISION OF NEOPLASM OF RIGHT  AXILLA  AND EXCISION OF NEOPLASM OF LEFT AXILLA;  Surgeon: Jamesetta So, MD;  Location: [facility] ORS;  Service: General;  Laterality: Right;  procedure end @ 08:23   Social History   Social History  . Marital status: Married    Spouse name: N/A  . Number of children: N/A  . Years of education: N/A   Social History Main Topics  . Smoking status: Never Smoker  . Smokeless tobacco: Never Used  . Alcohol use No  . Drug use: No  . Sexual activity: Not Asked   Other Topics Concern  . None   Social History Narrative  . None   Outpatient Encounter Prescriptions as of 12/02/2015  Medication Sig  . allopurinol (ZYLOPRIM) 300 MG tablet TAKE 1 TABLET (300 MG TOTAL) BY MOUTH DAILY.  Marland Kitchen amLODipine (NORVASC) 10 MG tablet   . azelastine (ASTELIN) 0.1 % nasal spray   . CARAFATE 1 GM/10ML suspension As needed  . cloNIDine (CATAPRES) 0.1 MG tablet daily.   . cyclobenzaprine (FLEXERIL) 10 MG tablet Take 1 tablet (10 mg total) by mouth at bedtime.  . dicyclomine (BENTYL) 10 MG capsule Take 1  capsule (10 mg total) by mouth 4 (four) times daily -  before meals and at bedtime.  . insulin aspart (NOVOLOG FLEXPEN) 100 UNIT/ML FlexPen Inject 15-21 Units into the skin 3 (three) times daily with meals.  . Insulin Detemir (LEVEMIR FLEXTOUCH [place]) Inject 80 Units into the skin at bedtime.  . ondansetron (ZOFRAN-ODT) 8 MG disintegrating tablet every 8 (eight) hours as needed.   . pantoprazole (PROTONIX) 40 MG tablet Take 1 tablet (40 mg total) by mouth daily. Take 30 minutes prior to breakfast each day.  . spironolactone (ALDACTONE) 25 MG tablet Take 25 mg by mouth 2 (two) times daily.  Marland Kitchen triamterene-hydrochlorothiazide (MAXZIDE) 75-50 MG tablet daily.   . Vitamin D, Ergocalciferol, (DRISDOL) 50000 units CAPS capsule every 7 (seven) days.   . [DISCONTINUED] allopurinol (ZYLOPRIM) 300 MG tablet Take 300 mg by mouth daily.  . [DISCONTINUED] Insulin Aspart (NOVOLOG FLEXPEN [place]) Inject 15-21 Units into the skin 3 (three) times daily with meals.   No facility-administered encounter medications on file as of 12/02/2015.    ALLERGIES: Allergies  Allergen Reactions  . Ace Inhibitors Anaphylaxis and Swelling  . Penicillins Itching and Swelling    Has patient had a PCN reaction causing immediate rash, facial/tongue/throat swelling, SOB or lightheadedness with hypotension: Yes  Has patient had a PCN reaction causing severe rash involving mucus membranes or skin necrosis: No Has patient had a PCN reaction that required hospitalization No Has patient had a PCN reaction occurring within the last 10 years: No If all of the above answers are "NO", then may proceed with Cephalosporin use.   . Statins Other (See Comments)    elevated LFT's   VACCINATION STATUS: Immunization History  Administered Date(s) Administered  . H1N1 01/16/2008  . Influenza Whole 12/28/2004, 02/24/2006, 12/07/2006, 02/01/2008, 01/28/2009, 11/25/2010  . Influenza,inj,Quad PF,36+ Mos 12/22/2012, 03/12/2015  . Pneumococcal  Polysaccharide-23 01/28/2009, 07/25/2015  . Td 11/12/2003  . Tdap 10/02/2014    Diabetes  She presents for her follow-up diabetic visit. She has type 2 diabetes mellitus. Onset time: Mrs. Kathryn Beck card was diagnosed with type 2 diabetes at approximate age of 16 years. Her disease course has been improving (She saw me in 2015 at which time she was given basal/bolus insulin regimen. She subsequently no showed to clinic visits for various reasons including insurance issues.). There are no hypoglycemic associated symptoms. Pertinent negatives for hypoglycemia include no confusion, headaches, pallor or seizures. Associated symptoms include blurred vision, fatigue, polydipsia and polyuria. Pertinent negatives for diabetes include no chest pain and no polyphagia. There are no hypoglycemic complications. Symptoms are improving. Diabetic complications include nephropathy. Risk factors for coronary artery disease include diabetes mellitus, dyslipidemia, family history, obesity, hypertension and sedentary lifestyle. Current diabetic treatment includes insulin injections (She claims to have been using Levemir 90 units at bedtime and NovoLog 25 units 3 times a day before meals. However, she did not bring any meter nor logs to review today. And she admits that she does not monitor blood glucose regularly.). Her weight is stable. She is following a generally unhealthy diet. When asked about meal planning, she reported none. She has not had a previous visit with a dietitian. She never participates in exercise. Her breakfast blood glucose range is generally >200 mg/dl. Her lunch blood glucose range is generally >200 mg/dl. Her dinner blood glucose range is generally >200 mg/dl. Her overall blood glucose range is >200 mg/dl. An ACE inhibitor/angiotensin II receptor blocker is being taken. Eye exam is current.  Hypertension  This is a chronic problem. The current episode started more than 1 year ago. The problem is uncontrolled.  Associated symptoms include blurred vision. Pertinent negatives include no chest pain, headaches, palpitations or shortness of breath. Risk factors for coronary artery disease include diabetes mellitus, obesity and family history. Past treatments include angiotensin blockers. Compliance problems include diet, exercise and psychosocial issues.  Hypertensive end-organ damage includes kidney disease.     Review of Systems  Constitutional: Positive for fatigue. Negative for activity change, chills, fever and unexpected weight change.  HENT: Negative for trouble swallowing and voice change.   Eyes: Positive for blurred vision. Negative for visual disturbance.  Respiratory: Negative for cough, shortness of breath and wheezing.   Cardiovascular: Negative for chest pain, palpitations and leg swelling.  Gastrointestinal: Negative for diarrhea, nausea and vomiting.  Endocrine: Positive for polydipsia and polyuria. Negative for cold intolerance, heat intolerance and polyphagia.  Genitourinary: Positive for frequency. Negative for dysuria and flank pain.  Musculoskeletal: Negative for arthralgias and myalgias.  Skin: Negative for color change, pallor, rash and wound.  Neurological: Negative for seizures and headaches.  Psychiatric/Behavioral: Negative for confusion, dysphoric mood and suicidal ideas.  All other systems reviewed and are negative.   Objective:    BP (!) 160/76   Pulse  69   Ht 5\' 6"  (1.676 m)   Wt 207 lb (93.9 kg)   BMI 33.41 kg/m   Wt Readings from Last 3 Encounters:  12/02/15 207 lb (93.9 kg)  10/30/15 202 lb (91.6 kg)  10/29/15 200 lb 3.2 oz (90.8 kg)    Physical Exam  Constitutional: She is oriented to person, place, and time. She appears well-developed.  HENT:  Head: Normocephalic and atraumatic.  Eyes: EOM are normal.  Neck: Normal range of motion. Neck supple. No tracheal deviation present. No thyromegaly present.  Cardiovascular: Normal rate and regular rhythm.    Pulmonary/Chest: Effort normal and breath sounds normal.  Abdominal: Soft. Bowel sounds are normal. There is no tenderness. There is no guarding.  Musculoskeletal: Normal range of motion. She exhibits no edema.  Neurological: She is alert and oriented to person, place, and time. She has normal reflexes. No cranial nerve deficit. Coordination normal.  Skin: Skin is warm and dry. No rash noted. No erythema. No pallor.  Psychiatric: She has a normal mood and affect. Judgment normal.    CMP     Component Value Date/Time   NA 137 10/15/2015 0549   K 4.2 10/15/2015 0549   CL 112 (H) 10/15/2015 0549   CO2 22 10/15/2015 0549   GLUCOSE 192 (H) 10/15/2015 0549   BUN 20 10/15/2015 0549   CREATININE 1.53 (H) 10/15/2015 0549   CREATININE 2.14 (H) 10/07/2015 1425   CALCIUM 8.4 (L) 10/15/2015 0549   PROT 6.0 (L) 10/14/2015 0627   ALBUMIN 2.7 (L) 10/14/2015 0627   AST 21 10/14/2015 0627   ALT 25 10/14/2015 0627   ALKPHOS 74 10/14/2015 0627   BILITOT 0.3 10/14/2015 0627   GFRNONAA 36 (L) 10/15/2015 0549   GFRNONAA 42 (L) 07/23/2015 1115   GFRAA 42 (L) 10/15/2015 0549   GFRAA 49 (L) 07/23/2015 1115     Diabetic Labs (most recent): Lab Results  Component Value Date   HGBA1C 10.2 (H) 10/07/2015   HGBA1C 10.3 (H) 07/25/2015   HGBA1C 10.6 (H) 03/12/2015     Lipid Panel ( most recent) Lipid Panel     Component Value Date/Time   CHOL 304 (H) 10/07/2015 1422   TRIG 711 (H) 10/07/2015 1422   HDL 54 10/07/2015 1422   CHOLHDL 5.6 (H) 10/07/2015 1422   VLDL NOT CALC 10/07/2015 1422   LDLCALC NOT CALC 10/07/2015 1422   LDLDIRECT 90 09/26/2014 0729       Assessment & Plan:   1. Uncontrolled type 2 diabetes mellitus with stage 3 chronic kidney disease, with long-term current use of insulin (HCC) Her diabetes is  complicated by Chronic kidney disease and patient remains at a high risk for more acute and chronic complications of diabetes which include CAD, CVA, CKD, retinopathy, and  neuropathy. These are all discussed in detail with the patient.  Patient came without the meter nor blood glucose profile, and  recent A1c of 10.2 %.    Recent labs reviewed.   - I have re-counseled the patient on diet management and weight loss  by adopting a carbohydrate restricted / protein rich  Diet.  - Suggestion is made for patient to avoid simple carbohydrates   from their diet including Cakes , Desserts, Ice Cream,  Soda (  diet and regular) , Sweet Tea , Candies,  Chips, Cookies, Artificial Sweeteners,   and "Sugar-free" Products .  This will help patient to have stable blood glucose profile and potentially avoid unintended  Weight gain.  -  Patient is advised to stick to a routine mealtimes to eat 3 meals  a day and avoid unnecessary snacks ( to snack only to correct hypoglycemia).  - The patient  will be  scheduled with Jearld Fenton, RDN, CDE for individualized DM education.  - I have approached patient with the following individualized plan to manage diabetes and patient agrees.   - Advised her to increase Levemir to 80 units daily at bedtime, increase NovoLog to 15 units 3 times a day before meals for pre-meal BG readings of 90-150mg /dl, plus patient specific correction dose of rapid acting insulin  for unexpected hyperglycemia above 150mg /dl, associated with strict monitoring of glucose  AC and HS. - Patient is warned not to take insulin without proper monitoring per orders. -Adjustment parameters are given for hypo and hyperglycemia in writing. - Her care was discussed with her adult daughter , who is an LPN, in the room who is offering to help. -Patient is encouraged to call clinic for blood glucose levels less than 70 or above 300 mg /dl.  -Patient is not a candidate for SGLT 2 inhibitors nor metformin due to CKD.  - Patient will be considered for incretin therapy as appropriate next visit. - Patient specific target  for A1c; LDL, HDL, Triglycerides, and  Waist  Circumference were discussed in detail.  2) BP/HTN:  Uncontrolled. Continue current medications including ARB. 3) Lipids/HPL: Uncontrolled, due to high triglycerides in 700s her LDL was not calculated. She reports intolerance to statins. 4)  Weight/Diet: CDE consult in progress, exercise, and carbohydrates information provided.  5) Chronic Care/Health Maintenance:  -Patient is on ARB medications and encouraged to continue to follow up with Ophthalmology, Podiatrist at least yearly or according to recommendations, and advised to  stay away from smoking. I have recommended yearly flu vaccine and pneumonia vaccination at least every 5 years; moderate intensity exercise for up to 150 minutes weekly; and  sleep for at least 7 hours a day.  - 25 minutes of time was spent on the care of this patient , 50% of which was applied for counseling on diabetes complications and their preventions.  - I advised patient to maintain close follow up with Tula Nakayama, MD for primary care needs.  Patient is asked to bring meter and  blood glucose logs during their next visit.   Follow up plan: -Return in about 3 months (around 03/02/2016) for follow up with pre-visit labs, meter, and logs.  Glade Lloyd, MD Phone: [phone]  Fax: [phone]   12/02/2015, 12:14 PM

## 2015-12-02 NOTE — Patient Instructions (Signed)
Goals 1. Eat three meals per day 2. Don't skip meals 3. Increase fresh fruits and vegetables. 4.Drink 5 bottles of water daily 5. Exercise 30 minutes x 5 days a week 6. Get A1C down to 8% or lower. 7. Lose 1-2 lbs per week.

## 2015-12-02 NOTE — Progress Notes (Signed)
  Medical Nutrition Therapy:  Appt start time: 1200 end time:  1230 Assessment:  Primary concerns today: DM and Obesity.   On Levrmir  70 units  and increased to 80 today by Dr. Dorris Fetch and Novolog 15 units TID.  Forgettting to eat at times due to work scheudle. Needs to eat meals on schedule.  Sometimes forgets to take her insulin at lunch time. Works as a Quarry manager  And gets busy and doesn't eat meals on time. Blister on left eye.. Going to make appt with eye doctor.  She notes she has made some changes with diet and better food choices but struggles with eating the right foods at the right times daily. Admits to need to exercise and going to start tonight.    Downloaded meter reveals AVG 245 mg/dl. DIet is inconsistent in carbs, protein and low in fresh fruits, vegetables and whole grains.   Lab Results  Component Value Date   HGBA1C 10.2 (H) 10/07/2015   Wt Readings from Last 3 Encounters:  12/02/15 206 lb (93.4 kg)  12/02/15 207 lb (93.9 kg)  10/30/15 202 lb (91.6 kg)   Ht Readings from Last 3 Encounters:  12/02/15 5\' 6"  (1.676 m)  12/02/15 5\' 6"  (1.676 m)  10/30/15 5\' 6"  (1.676 m)   Body mass index is 33.25 kg/m.  Preferred Learning Style:  No preference indicated   Learning Readiness:  Not ready  Contemplating  Ready  Change in progress   MEDICATIONS:   DIETARY INTAKE:    24-hr recall:  B ( AM): Sausage and coffee Snk ( AM): L ( PM)  Hamburger patty, coleslaw and baked beans,  8 oz juice Snk ( PM):  D ( PM): Hamburger patty. Snk ( PM):  Beverages:water  Usual physical activity: ADL   Estimated energy needs: 1500 calories 170  g carbohydrates 112 g protein 42 g fat  Progress Towards Goal(s):  In progress.   Nutritional Diagnosis:  NB-1.1 Food and nutrition-related knowledge deficit As related to Diabetes.  As evidenced by A1C 10.2%..    Intervention:  Nutrition and Diabetes education provided on My Plate, CHO counting, meal planning, portion sizes,  timing of meals, avoiding snacks between meals unless having a low blood sugar, target ranges for A1C and blood sugars, signs/symptoms and treatment of hyper/hypoglycemia, monitoring blood sugars, taking medications as prescribed, benefits of exercising 30 minutes per day and prevention of complications of DM.  Goals 1. Eat three meals per day 2. Don't skip meals 3. Increase fresh fruits and vegetables. 4.Drink 5 bottles of water daily 5. Exercise 30 minutes x 5 days a week 6. Get A1C down to 8% or lower. 7. Lose 1-2 lbs per week.  Teaching Method Utilized:  Visual Auditory Hands on  Handouts given during visit include:  The Plate Method  Meal Plan  Diabetes Instructions.   Barriers to learning/adherence to lifestyle change: NOne  Demonstrated degree of understanding via:  Teach Back   Monitoring/Evaluation:  Dietary intake, exercise, meal planning, SBG, and body weight in 3 month(s).

## 2015-12-02 NOTE — Patient Instructions (Signed)

## 2015-12-17 ENCOUNTER — Other Ambulatory Visit: Payer: Self-pay

## 2015-12-17 MED ORDER — INSULIN LISPRO 100 UNIT/ML (KWIKPEN): 15.0000 [IU] | PEN_INJECTOR | Freq: Three times a day (TID) | SUBCUTANEOUS | 2 refills | Status: DC

## 2015-12-18 ENCOUNTER — Other Ambulatory Visit: Payer: Self-pay | Admitting: "Endocrinology

## 2016-01-13 LAB — HM DIABETES EYE EXAM

## 2016-01-14 ENCOUNTER — Encounter: Payer: Self-pay | Admitting: Family Medicine

## 2016-01-14 ENCOUNTER — Other Ambulatory Visit: Payer: Self-pay | Admitting: Family Medicine

## 2016-01-14 ENCOUNTER — Ambulatory Visit (INDEPENDENT_AMBULATORY_CARE_PROVIDER_SITE_OTHER): Payer: 59 | Admitting: Family Medicine

## 2016-01-14 VITALS — BP 150/88 | HR 110 | Ht 66.0 in | Wt 211.4 lb

## 2016-01-14 DIAGNOSIS — E782 Mixed hyperlipidemia: Secondary | ICD-10-CM

## 2016-01-14 DIAGNOSIS — Z794 Long term (current) use of insulin: Secondary | ICD-10-CM

## 2016-01-14 DIAGNOSIS — E1165 Type 2 diabetes mellitus with hyperglycemia: Secondary | ICD-10-CM

## 2016-01-14 DIAGNOSIS — I10 Essential (primary) hypertension: Secondary | ICD-10-CM | POA: Diagnosis not present

## 2016-01-14 DIAGNOSIS — R600 Localized edema: Secondary | ICD-10-CM

## 2016-01-14 DIAGNOSIS — Z23 Encounter for immunization: Secondary | ICD-10-CM | POA: Diagnosis not present

## 2016-01-14 DIAGNOSIS — E1365 Other specified diabetes mellitus with hyperglycemia: Secondary | ICD-10-CM

## 2016-01-14 DIAGNOSIS — E1322 Other specified diabetes mellitus with diabetic chronic kidney disease: Secondary | ICD-10-CM

## 2016-01-14 DIAGNOSIS — E1122 Type 2 diabetes mellitus with diabetic chronic kidney disease: Secondary | ICD-10-CM

## 2016-01-14 DIAGNOSIS — IMO0002 Reserved for concepts with insufficient information to code with codable children: Secondary | ICD-10-CM

## 2016-01-14 DIAGNOSIS — N184 Chronic kidney disease, stage 4 (severe): Secondary | ICD-10-CM

## 2016-01-14 DIAGNOSIS — N183 Chronic kidney disease, stage 3 (moderate): Secondary | ICD-10-CM

## 2016-01-14 MED ORDER — FUROSEMIDE 40 MG PO TABS: 40.0000 mg | ORAL_TABLET | Freq: Every day | ORAL | 3 refills | Status: DC

## 2016-01-14 MED ORDER — FUROSEMIDE 40 MG PO TABS: 40.0000 mg | ORAL_TABLET | Freq: Two times a day (BID) | ORAL | 0 refills | Status: DC

## 2016-01-14 NOTE — Patient Instructions (Addendum)
F/u in November as before  Flu vaccine today  New medication to help with swelling lasix twice daily  pLEASE reduce salt intake, eat mainly fresh or frozen vegetables, no canned foods,, trade animal protein (meat) to plant protein , BEANS   Labs today  Rept chem 7 and eGFR in 1 week also  You are referred to cardiology. BP is high and this is causing strain on both kidneys and heart Since morning blood sugar stays high, need to increase levemir dose, ALWAYS contact Dr Dorris Fetch as he recommends if blood sugars are high. High blood sugars are causing kidneys to worsen , be more damaged, and increasing heart disease risk  Info re bariatric surgery will be provided, looking at this to corect diabetes and uncontrolled blood pressure may be your best option

## 2016-01-14 NOTE — Assessment & Plan Note (Signed)
Uncontrolled, has not had medication this am, encouraged compliance DASH diet and commitment to daily physical activity for a minimum of 30 minutes discussed and encouraged, as a part of hypertension management. The importance of attaining a healthy weight is also discussed.  BP/Weight 01/14/2016 12/02/2015 12/02/2015 10/30/2015 10/29/2015 07/26/14 06/23/9035  Systolic BP 955 - 831 - 674 255 258  Diastolic BP 88 - 76 - 89 88 82  Wt. (Lbs) 211.4 206 207 202 201 200.2 199.12  BMI 34.12 33.25 33.41 32.6 32.44 32.31 32.14

## 2016-01-15 LAB — COMPLETE METABOLIC PANEL WITH GFR
ALBUMIN: 3.3 g/dL — AB (ref 3.6–5.1)
ALT: 20 U/L (ref 6–29)
AST: 18 U/L (ref 10–35)
Alkaline Phosphatase: 84 U/L (ref 33–130)
BUN: 38 mg/dL — AB (ref 7–25)
CHLORIDE: 105 mmol/L (ref 98–110)
CO2: 23 mmol/L (ref 20–31)
Calcium: 9.5 mg/dL (ref 8.6–10.4)
Creat: 1.94 mg/dL — ABNORMAL HIGH (ref 0.50–1.05)
GFR, EST NON AFRICAN AMERICAN: 28 mL/min — AB (ref >=60)
GFR, Est African American: 32 mL/min — ABNORMAL LOW (ref >=60)
GLUCOSE: 207 mg/dL — AB (ref 65–99)
POTASSIUM: 5.1 mmol/L (ref 3.5–5.3)
SODIUM: 138 mmol/L (ref 135–146)
Total Bilirubin: 0.2 mg/dL (ref 0.2–1.2)
Total Protein: 6.3 g/dL (ref 6.1–8.1)

## 2016-01-15 LAB — LIPID PANEL
CHOL/HDL RATIO: 6.6 ratio — AB (ref <=5.0)
CHOLESTEROL: 308 mg/dL — AB (ref 125–200)
HDL: 47 mg/dL (ref >=46)
TRIGLYCERIDES: 591 mg/dL — AB (ref <150)

## 2016-01-16 LAB — HEMOGLOBIN A1C
Hgb A1c MFr Bld: 10 % — ABNORMAL HIGH (ref <5.7)
Mean Plasma Glucose: 240 mg/dL

## 2016-01-17 ENCOUNTER — Other Ambulatory Visit: Payer: Self-pay | Admitting: Family Medicine

## 2016-01-17 LAB — LDL CHOLESTEROL, DIRECT: Direct LDL: 145 mg/dL — ABNORMAL HIGH (ref <130)

## 2016-01-24 LAB — BASIC METABOLIC PANEL WITH GFR
BUN: 49 mg/dL — ABNORMAL HIGH (ref 7–25)
CALCIUM: 10 mg/dL (ref 8.6–10.4)
CO2: 23 mmol/L (ref 20–31)
CREATININE: 2.51 mg/dL — AB (ref 0.50–1.05)
Chloride: 103 mmol/L (ref 98–110)
GFR, EST AFRICAN AMERICAN: 23 mL/min — AB (ref >=60)
GFR, EST NON AFRICAN AMERICAN: 20 mL/min — AB (ref >=60)
Glucose, Bld: 200 mg/dL — ABNORMAL HIGH (ref 65–99)
Potassium: 4.6 mmol/L (ref 3.5–5.3)
SODIUM: 137 mmol/L (ref 135–146)

## 2016-01-26 ENCOUNTER — Encounter: Payer: Self-pay | Admitting: Family Medicine

## 2016-01-26 DIAGNOSIS — R600 Localized edema: Secondary | ICD-10-CM | POA: Insufficient documentation

## 2016-01-26 NOTE — Assessment & Plan Note (Signed)
Hyperlipidemia:Low fat diet discussed and encouraged.   Lipid Panel  Lab Results  Component Value Date   CHOL 308 (H) 01/14/2016   HDL 47 01/14/2016   LDLCALC NOT CALC 01/14/2016   LDLDIRECT 145 (H) 01/14/2016   TRIG 591 (H) 01/14/2016   CHOLHDL 6.6 (H) 01/14/2016   Markedly elevated lipids and statin intolerant , start zetia , cardiology to assist with lipid management also

## 2016-01-26 NOTE — Progress Notes (Signed)
   Kathryn Beck     MRN: [id number]      DOB: 1955/05/31   HPI Ms. Croft is here for follow up and re-evaluation of chronic medical conditions, medication management and review of any available recent lab and radiology data.  Preventive health is updated, specifically  Cancer screening and Immunization.   C/o worsening leg swelling, over past 2 months Reports fluctuation in b;lood sugar, generally higher than goal when checked ROS Denies recent fever or chills. Denies sinus pressure, nasal congestion, ear pain or sore throat. Denies chest congestion, productive cough or wheezing. Denies chest pains, palpitations  Denies abdominal pain, nausea, vomiting,diarrhea or constipation.   Denies dysuria, frequency, hesitancy or incontinence. Denies skin break down or rash.   PE  BP (!) 150/88   Pulse (!) 110   Ht 5\' 6"  (1.676 m)   Wt 211 lb 6.4 oz (95.9 kg)   SpO2 96%   BMI 34.12 kg/m   Patient alert and oriented and in no cardiopulmonary distress.  HEENT: No facial asymmetry, EOMI,   oropharynx pink and moist.  Neck supple no JVD, no mass.  Chest: Clear to auscultation bilaterally.  CVS: S1, S2 no murmurs, no S3.Regular rate.  ABD: Soft non tender.   Ext:Two plus pitting edema  MS: Adequate ROM spine, shoulders, hips and knees.  Skin: Intact, no ulcerations or rash noted.  Psych: Good eye contact, normal affect. Memory intact not anxious or depressed appearing.  CNS: CN 2-12 intact, power,  normal throughout.no focal deficits noted.   Assessment & Plan Essential hypertension Uncontrolled, has not had medication this am, encouraged compliance DASH diet and commitment to daily physical activity for a minimum of 30 minutes discussed and encouraged, as a part of hypertension management. The importance of attaining a healthy weight is also discussed.  BP/Weight 01/14/2016 12/02/2015 12/02/2015 10/30/2015 10/29/2015 04/26/5359 06/24/3152  Systolic BP 008 - 676 - 195 093 267    Diastolic BP 88 - 76 - 89 88 82  Wt. 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   Lipid Panel  Lab Results  Component Value Date   CHOL 308 (H) 01/14/2016   HDL 47 01/14/2016   LDLCALC NOT CALC 01/14/2016   LDLDIRECT 145 (H) 01/14/2016   TRIG 591 (H) 01/14/2016   CHOLHDL 6.6 (H) 01/14/2016   Markedly elevated lipids and statin intolerant , start zetia , cardiology to assist with lipid management also

## 2016-01-26 NOTE — Assessment & Plan Note (Signed)
Progressive leg swelling in pt with multiple CV risk, refer to cardiology for re eval for heart failure and CVD, also assistance with BP management, has progressive renal disease

## 2016-01-26 NOTE — Assessment & Plan Note (Addendum)
Worsening, needs follow up and more frequent management  by nephrology,  Lasix dose increased and will Needs re evaluation by nephrology, referral entered after visit

## 2016-01-28 NOTE — Progress Notes (Signed)
Cardiology Office Note   Date:  01/29/2016   ID:  Kathryn Beck, DOB March 13, 1956, MRN [id number]  PCP:  Tula Nakayama, MD  Cardiologist:   Johnny Bridge   No chief complaint on file.     History of Present Illness: Kathryn Beck is a 60 y.o. female who presents for evaluation of LE edema. History of HTN, elevated lipids , poorly  Controlled DM , Obesity and CRF.  Recent baseline Cr worse at 2.5 LDL 145.  Compliance issues with meds. Reviewed Notes from Dr Moshe Cipro 01/14/16 and she was concerned that progressive leg swelling may indicate heart failure as opposed to volume overload from renal failure that is more likely  Seen by Dr Skeet Latch in 2015 for chest pain and dyspnea W/u benign and felt risk factor modification all that is needed  Last echo reviewed from 01/24/14 EF 65-70% grade one Diastolic dysfunction no significant valve disease septal thickness only 9 mm   Myovue ordered normal with no ischemia EF 60% History of carotid bruit and duplex 05/09/15 plaque no stenosis   She has had more edema since July Dependant no history of DVT no PND orthopnea  Poor diet with carbs and salt   Past Medical History:  Diagnosis Date  . Anemia   . Axillary masses    Soft tissue - status post excision  . Back pain   . Essential hypertension   . Mixed hyperlipidemia   . Obesity   . Sleep apnea    Noncompliant with CPAP  . Type 2 diabetes mellitus, uncontrolled ([place])     Past Surgical History:  Procedure Laterality Date  . ABDOMINAL HYSTERECTOMY    . COLONOSCOPY  2008   Dr. Oneida Alar: normal   . ESOPHAGEAL DILATION N/A 10/13/2015   Procedure: ESOPHAGEAL DILATION;  Surgeon: Rogene Houston, MD;  Location: [facility];  Service: Endoscopy;  Laterality: N/A;  . ESOPHAGOGASTRODUODENOSCOPY N/A 10/13/2015   Dr. Laural Golden: chronic gastritis on path, no H.pylori. Empiric dilation   . FOOT SURGERY Bilateral   . LUNG BIOPSY    . MASS EXCISION Right 01/09/2013   Procedure: EXCISION OF  NEOPLASM OF RIGHT  AXILLA  AND EXCISION OF NEOPLASM OF LEFT AXILLA;  Surgeon: Jamesetta So, MD;  Location: [facility];  Service: General;  Laterality: Right;  procedure end @ 08:23     Current Outpatient Prescriptions  Medication Sig Dispense Refill  . allopurinol (ZYLOPRIM) 300 MG tablet TAKE 1 TABLET (300 MG TOTAL) BY MOUTH DAILY. 30 tablet 3  . amLODipine (NORVASC) 10 MG tablet     . azelastine (ASTELIN) 0.1 % nasal spray     . CARAFATE 1 GM/10ML suspension As needed    . cloNIDine (CATAPRES) 0.1 MG tablet daily.     . cyclobenzaprine (FLEXERIL) 10 MG tablet Take 1 tablet (10 mg total) by mouth at bedtime. 30 tablet 3  . dicyclomine (BENTYL) 10 MG capsule Take 1 capsule (10 mg total) by mouth 4 (four) times daily -  before meals and at bedtime. 120 capsule 3  . ezetimibe (ZETIA) 10 MG tablet Take 1 tablet (10 mg total) by mouth daily. 30 tablet 5  . furosemide (LASIX) 40 MG tablet Take 1 tablet (40 mg total) by mouth 2 (two) times daily. 60 tablet 0  . HUMALOG KWIKPEN 100 UNIT/ML KiwkPen INJECT SUBCUTANEOUSLY UP TO 24 UNITS 3 TIMES DAILY  BEFORE MEALS 75 mL 0  . insulin aspart (NOVOLOG FLEXPEN) 100 UNIT/ML FlexPen Inject 15-21 Units  into the skin 3 (three) times daily with meals. 20 mL 2  . Insulin Detemir (LEVEMIR FLEXTOUCH [place]) Inject 80 Units into the skin at bedtime.    . ondansetron (ZOFRAN-ODT) 8 MG disintegrating tablet every 8 (eight) hours as needed.     . pantoprazole (PROTONIX) 40 MG tablet Take 1 tablet (40 mg total) by mouth daily. Take 30 minutes prior to breakfast each day. 90 tablet 3  . pravastatin (PRAVACHOL) 20 MG tablet Take 1 tablet (20 mg total) by mouth daily. 30 tablet 3  . spironolactone (ALDACTONE) 25 MG tablet Take 25 mg by mouth 2 (two) times daily.    Marland Kitchen triamterene-hydrochlorothiazide (MAXZIDE) 75-50 MG tablet daily.     . Vitamin D, Ergocalciferol, (DRISDOL) 50000 units CAPS capsule every 7 (seven) days.      No current facility-administered medications for this  visit.     Allergies:   Ace inhibitors; Penicillins; and Statins    Social History:  The patient  reports that she has never smoked. She has never used smokeless tobacco. She reports that she does not drink alcohol or use drugs.   Family History:  The patient's family history includes Hypertension in her father.    ROS:  Please see the history of present illness.   Otherwise, review of systems are positive for none.   All other systems are reviewed and negative.    PHYSICAL EXAM: VS:  BP (!) 150/80   Pulse (!) 109   Ht 5\' 5"  (1.651 m)   Wt 94.8 kg (209 lb)   SpO2 98%   BMI 34.78 kg/m  , BMI Body mass index is 34.78 kg/m. Affect appropriate Obese black female  HEENT: normal Neck supple with no adenopathy JVP normal no bruits no thyromegaly Lungs clear with no wheezing and good diaphragmatic motion Heart:  S1/S2 no murmur, no rub, gallop or click PMI normal Abdomen: benighn, BS positve, no tenderness, no AAA no bruit.  No HSM or HJR Distal pulses intact with no bruits Plus one LE edema Neuro non-focal Skin warm and dry No muscular weakness    EKG:  01/18/14 NSR rate 78 normal low precordial voltage 01/29/16  SR rate 100 low lateral voltage From body habitus    Recent Labs: 04/25/2015: TSH 2.006 10/12/2015: Hemoglobin 11.4; Magnesium 1.9; Platelets 358 01/14/2016: ALT 20 01/23/2016: BUN 49; Creat 2.51; Potassium 4.6; Sodium 137    Lipid Panel    Component Value Date/Time   CHOL 308 (H) 01/14/2016 1033   TRIG 591 (H) 01/14/2016 1033   HDL 47 01/14/2016 1033   CHOLHDL 6.6 (H) 01/14/2016 1033   VLDL NOT CALC 01/14/2016 1033   LDLCALC NOT CALC 01/14/2016 1033   LDLDIRECT 145 (H) 01/14/2016 1033      Wt Readings from Last 3 Encounters:  01/29/16 94.8 kg (209 lb)  01/14/16 95.9 kg (211 lb 6.4 oz)  12/02/15 93.4 kg (206 lb)      Other studies Reviewed: Additional studies/ records that were reviewed today include: Notes Dr Moshe Cipro labs ECG and old myovue echo  .    ASSESSMENT AND PLAN:  1.  Edema:dependant related to edema obesity and CRF Continue diuretic low sodium diet 2. HTN:  Well controlled.  Continue current medications and low sodium Dash type diet.   3. CRF  Concerning f/u nephrology she will be on dialysis if she doesn't take better care of herself 4. DM  Discussed low carb diet.  Target hemoglobin A1c is 6.5 or less.  Continue  current medications. 5. Chol  Continue statin     Current medicines are reviewed at length with the patient today.  The patient does not have concerns regarding medicines.  The following changes have been made:  no change  Labs/ tests ordered today include: Echo   Orders Placed This Encounter  Procedures  . EKG 12-Lead  . ECHOCARDIOGRAM COMPLETE     Disposition:   FU with [place] PRN      Signed, Jenkins Rouge, MD  01/29/2016 1:28 PM    [facility] [address], [place], [place]  [address] Phone: [phone]; Fax: [phone]

## 2016-01-29 ENCOUNTER — Encounter: Payer: Self-pay | Admitting: Cardiovascular Disease

## 2016-01-29 ENCOUNTER — Ambulatory Visit (INDEPENDENT_AMBULATORY_CARE_PROVIDER_SITE_OTHER): Payer: 59 | Admitting: Cardiovascular Disease

## 2016-01-29 VITALS — BP 150/80 | HR 109 | Ht 65.0 in | Wt 209.0 lb

## 2016-01-29 DIAGNOSIS — I10 Essential (primary) hypertension: Secondary | ICD-10-CM | POA: Diagnosis not present

## 2016-01-29 DIAGNOSIS — R609 Edema, unspecified: Secondary | ICD-10-CM

## 2016-01-29 NOTE — Patient Instructions (Signed)
Medication Instructions:  Your physician recommends that you continue on your current medications as directed. Please refer to the Current Medication list given to you today.    Labwork: none  Testing/Procedures: Your physician has requested that you have an echocardiogram. Echocardiography is a painless test that uses sound waves to create images of your heart. It provides your doctor with information about the size and shape of your heart and how well your heart's chambers and valves are working. This procedure takes approximately one hour. There are no restrictions for this procedure.    Follow-Up: Your physician recommends that you schedule a follow-up appointment in: AS NEEDED    Any Other Special Instructions Will Be Listed Below (If Applicable).     If you need a refill on your cardiac medications before your next appointment, please call your pharmacy.

## 2016-01-31 ENCOUNTER — Telehealth: Payer: Self-pay

## 2016-01-31 DIAGNOSIS — E782 Mixed hyperlipidemia: Secondary | ICD-10-CM

## 2016-01-31 DIAGNOSIS — I10 Essential (primary) hypertension: Secondary | ICD-10-CM

## 2016-01-31 MED ORDER — EZETIMIBE 10 MG PO TABS: 10.0000 mg | ORAL_TABLET | Freq: Every day | ORAL | 5 refills | Status: DC

## 2016-01-31 MED ORDER — PRAVASTATIN SODIUM 20 MG PO TABS: 20.0000 mg | ORAL_TABLET | Freq: Every day | ORAL | 3 refills | Status: DC

## 2016-01-31 NOTE — Telephone Encounter (Signed)
-----   Message from Fayrene Helper, MD sent at 01/17/2016  6:51 AM EDT ----- pls add LDL Call pt with results, needs to get sooner appt with endo a her blood sugar is not significantly better, I will also send him a note asking to have appt moved fwd Cholesterol still too high, liver enz are good.I recommend trial of low dose statin pravachol 20 mg and she should see if can get zetia free form pharmaceutical company, no effect on liver and will help TG, I am entering both Needs rept hepatic panel in 6 weeks and 12 weeks if takes medications , and fasting lipid in 12 weeks  She has an appt with me in Nov so can get lab orders then ?? pls ask

## 2016-02-03 ENCOUNTER — Ambulatory Visit: Payer: 59 | Admitting: Family Medicine

## 2016-02-04 ENCOUNTER — Encounter: Payer: Self-pay | Admitting: Gastroenterology

## 2016-02-04 ENCOUNTER — Ambulatory Visit (INDEPENDENT_AMBULATORY_CARE_PROVIDER_SITE_OTHER): Payer: 59 | Admitting: Gastroenterology

## 2016-02-04 VITALS — BP 130/68 | HR 101 | Temp 98.4°F | Ht 65.0 in | Wt 209.6 lb

## 2016-02-04 DIAGNOSIS — K589 Irritable bowel syndrome without diarrhea: Secondary | ICD-10-CM | POA: Insufficient documentation

## 2016-02-04 DIAGNOSIS — K219 Gastro-esophageal reflux disease without esophagitis: Secondary | ICD-10-CM

## 2016-02-04 DIAGNOSIS — K580 Irritable bowel syndrome with diarrhea: Secondary | ICD-10-CM | POA: Diagnosis not present

## 2016-02-04 NOTE — Patient Instructions (Signed)
We will call you to schedule a colonoscopy over the phone in March 2018.   We will see you back in 1 year!

## 2016-02-04 NOTE — Progress Notes (Signed)
Referring Provider: Fayrene Helper, MD Primary Care Physician:  Tula Nakayama, MD Primary GI: Dr. Oneida Alar   Chief Complaint  Patient presents with  . Follow-up    HPI:   Kathryn Beck is a 60 y.o. female presenting today with a history of chronic gastritis, IBS, here for follow-up.    Taking Bentyl as needed and doing well with this. Very rare abdominal cramping. Still with postprandial symptoms but rare diarrhea. Protonix once each morning. Good appetite, states it is "too good". No N/V. No dysphagia. Very rare breakthrough reflux. Heme negative recently. Due for colonoscopy March 2018.     Past Medical History:  Diagnosis Date  . Anemia   . Axillary masses    Soft tissue - status post excision  . Back pain   . Essential hypertension   . Mixed hyperlipidemia   . Obesity   . Sleep apnea    Noncompliant with CPAP  . Type 2 diabetes mellitus, uncontrolled ([place])     Past Surgical History:  Procedure Laterality Date  . ABDOMINAL HYSTERECTOMY    . COLONOSCOPY  2008   Dr. Oneida Alar: normal   . ESOPHAGEAL DILATION N/A 10/13/2015   Procedure: ESOPHAGEAL DILATION;  Surgeon: Rogene Houston, MD;  [facility];  Service: Endoscopy;  Laterality: N/A;  . ESOPHAGOGASTRODUODENOSCOPY N/A 10/13/2015   Dr. Laural Golden: chronic gastritis on path, no H.pylori. Empiric dilation   . FOOT SURGERY Bilateral   . LUNG BIOPSY    . MASS EXCISION Right 01/09/2013   Procedure: EXCISION OF NEOPLASM OF RIGHT  AXILLA  AND EXCISION OF NEOPLASM OF LEFT AXILLA;  Surgeon: Jamesetta So, MD;  Location: [facility];  Service: General;  Laterality: Right;  procedure end @ 08:23    Current Outpatient Prescriptions  Medication Sig Dispense Refill  . allopurinol (ZYLOPRIM) 300 MG tablet TAKE 1 TABLET (300 MG TOTAL) BY MOUTH DAILY. 30 tablet 3  . amLODipine (NORVASC) 10 MG tablet     . azelastine (ASTELIN) 0.1 % nasal spray     . CARAFATE 1 GM/10ML suspension As needed    . cloNIDine (CATAPRES)  0.1 MG tablet daily.     . cyclobenzaprine (FLEXERIL) 10 MG tablet Take 1 tablet (10 mg total) by mouth at bedtime. 30 tablet 3  . dicyclomine (BENTYL) 10 MG capsule Take 1 capsule (10 mg total) by mouth 4 (four) times daily -  before meals and at bedtime. 120 capsule 3  . ezetimibe (ZETIA) 10 MG tablet Take 1 tablet (10 mg total) by mouth daily. 30 tablet 5  . furosemide (LASIX) 40 MG tablet Take 1 tablet (40 mg total) by mouth 2 (two) times daily. 60 tablet 0  . HUMALOG KWIKPEN 100 UNIT/ML KiwkPen INJECT SUBCUTANEOUSLY UP TO 24 UNITS 3 TIMES DAILY  BEFORE MEALS 75 mL 0  . insulin aspart (NOVOLOG FLEXPEN) 100 UNIT/ML FlexPen Inject 15-21 Units into the skin 3 (three) times daily with meals. 20 mL 2  . Insulin Detemir (LEVEMIR FLEXTOUCH [place]) Inject 80 Units into the skin at bedtime.    . ondansetron (ZOFRAN-ODT) 8 MG disintegrating tablet every 8 (eight) hours as needed.     . pantoprazole (PROTONIX) 40 MG tablet Take 1 tablet (40 mg total) by mouth daily. Take 30 minutes prior to breakfast each day. 90 tablet 3  . pravastatin (PRAVACHOL) 20 MG tablet Take 1 tablet (20 mg total) by mouth daily. 30 tablet 3  . spironolactone (ALDACTONE) 25 MG tablet Take 25 mg  by mouth 2 (two) times daily.    Marland Kitchen triamterene-hydrochlorothiazide (MAXZIDE) 75-50 MG tablet daily.     . Vitamin D, Ergocalciferol, (DRISDOL) 50000 units CAPS capsule every 7 (seven) days.      No current facility-administered medications for this visit.     Allergies as of 02/04/2016 - Review Complete 02/04/2016  Allergen Reaction Noted  . Ace inhibitors Anaphylaxis and Swelling 06/07/2007  . Penicillins Itching and Swelling 06/07/2007  . Statins Other (See Comments) 12/24/2012    Family History  Problem Relation Age of Onset  . Hypertension Father     Social History   Social History  . Marital status: Married    Spouse name: N/A  . Number of children: N/A  . Years of education: N/A   Social History Main Topics  . Smoking  status: Never Smoker  . Smokeless tobacco: Never Used  . Alcohol use No  . Drug use: No  . Sexual activity: Not Currently   Other Topics Concern  . None   Social History Narrative  . None    Review of Systems: As mentioned in HPI   Physical Exam: BP 130/68   Pulse (!) 101   Temp 98.4 F (36.9 C) (Oral)   Ht 5\' 5"  (1.651 m)   Wt 209 lb 9.6 oz (95.1 kg)   BMI 34.88 kg/m  General:   Alert and oriented. No distress noted. Pleasant and cooperative.  Head:  Normocephalic and atraumatic. Eyes:  Conjuctiva clear without scleral icterus. Mouth:  Oral mucosa pink and moist. Good dentition. No lesions. Abdomen:  +BS, soft, non-tender and non-distended. No rebound or guarding. No HSM or masses noted. Msk:  Symmetrical without gross deformities. Normal posture. Extremities:  Without edema. Neurologic:  Alert and  oriented x4;  grossly normal neurologically. Psych:  Alert and cooperative. Normal mood and affect.

## 2016-02-05 ENCOUNTER — Other Ambulatory Visit: Payer: Self-pay | Admitting: Family Medicine

## 2016-02-05 ENCOUNTER — Other Ambulatory Visit: Payer: Self-pay | Admitting: "Endocrinology

## 2016-02-05 DIAGNOSIS — J302 Other seasonal allergic rhinitis: Secondary | ICD-10-CM

## 2016-02-05 NOTE — Assessment & Plan Note (Signed)
Doing well, continue Protonix once daily.

## 2016-02-05 NOTE — Assessment & Plan Note (Signed)
Continue Bentyl prn. Routine screening colonoscopy March 2018; we will triage over the phone. Return in 1 year.

## 2016-02-06 NOTE — Progress Notes (Signed)
cc'ed to pcp °

## 2016-02-10 ENCOUNTER — Other Ambulatory Visit (HOSPITAL_COMMUNITY): Payer: 59

## 2016-02-19 ENCOUNTER — Telehealth: Payer: Self-pay

## 2016-02-19 ENCOUNTER — Encounter: Payer: 59 | Admitting: Family Medicine

## 2016-02-19 NOTE — Telephone Encounter (Signed)
Kathryn Beck has a process that patients need to go through before scheduling surgery and noone has anything by the end of the year and I left that message on the patients voicemail

## 2016-02-19 NOTE — Telephone Encounter (Signed)
Noted, thanks!

## 2016-02-19 NOTE — Telephone Encounter (Signed)
States she wants to be referred to a surgeon who can do her bariatric surgery by the end of this year so she can get it for no cost- per her insurance. She said that [facility] wasn't scheduling anymore surgeries by the end of the year. I tried to explain to her that the surgeons required a "process" and evaluation before they would schedule surgery and it most likely couldn't be done by the end of the year. She is still insisting we find a Psychologist, sport and exercise in her network that will do her surgery asap. Please advise

## 2016-02-19 NOTE — Telephone Encounter (Signed)
pls  See if she can get "sooner appt eg at [facility], otherwise remind her that there is a wait time for all non emnergency appointments ,

## 2016-02-21 ENCOUNTER — Other Ambulatory Visit: Payer: Self-pay | Admitting: Family Medicine

## 2016-02-28 ENCOUNTER — Other Ambulatory Visit: Payer: Self-pay | Admitting: "Endocrinology

## 2016-02-28 LAB — LIPID PANEL
CHOLESTEROL: 324 mg/dL — AB (ref <200)
HDL: 53 mg/dL (ref >50)
TRIGLYCERIDES: 827 mg/dL — AB (ref <150)
Total CHOL/HDL Ratio: 6.1 Ratio — ABNORMAL HIGH (ref <5.0)

## 2016-02-28 LAB — COMPLETE METABOLIC PANEL WITH GFR
ALBUMIN: 3.8 g/dL (ref 3.6–5.1)
ALK PHOS: 83 U/L (ref 33–130)
ALT: 24 U/L (ref 6–29)
AST: 18 U/L (ref 10–35)
BUN: 37 mg/dL — AB (ref 7–25)
CALCIUM: 9.8 mg/dL (ref 8.6–10.4)
CHLORIDE: 106 mmol/L (ref 98–110)
CO2: 25 mmol/L (ref 20–31)
Creat: 1.91 mg/dL — ABNORMAL HIGH (ref 0.50–1.05)
GFR, EST AFRICAN AMERICAN: 33 mL/min — AB (ref >=60)
GFR, EST NON AFRICAN AMERICAN: 28 mL/min — AB (ref >=60)
Glucose, Bld: 141 mg/dL — ABNORMAL HIGH (ref 65–99)
POTASSIUM: 4.6 mmol/L (ref 3.5–5.3)
Sodium: 139 mmol/L (ref 135–146)
Total Bilirubin: 0.3 mg/dL (ref 0.2–1.2)
Total Protein: 7.1 g/dL (ref 6.1–8.1)

## 2016-02-29 LAB — MICROALBUMIN / CREATININE URINE RATIO
CREATININE, URINE: 134 mg/dL (ref 20–320)
MICROALB UR: 75.9 mg/dL
MICROALB/CREAT RATIO: 566 ug/mg{creat} — AB (ref <30)

## 2016-02-29 LAB — HEMOGLOBIN A1C
Hgb A1c MFr Bld: 11.2 % — ABNORMAL HIGH (ref <5.7)
MEAN PLASMA GLUCOSE: 275 mg/dL

## 2016-03-02 ENCOUNTER — Ambulatory Visit (INDEPENDENT_AMBULATORY_CARE_PROVIDER_SITE_OTHER): Payer: 59 | Admitting: "Endocrinology

## 2016-03-02 ENCOUNTER — Encounter: Payer: 59 | Attending: Radiology | Admitting: Nutrition

## 2016-03-02 ENCOUNTER — Encounter: Payer: Self-pay | Admitting: "Endocrinology

## 2016-03-02 VITALS — Ht 65.0 in | Wt 210.0 lb

## 2016-03-02 VITALS — BP 156/81 | HR 118 | Ht 65.0 in | Wt 210.0 lb

## 2016-03-02 DIAGNOSIS — N183 Chronic kidney disease, stage 3 (moderate): Secondary | ICD-10-CM | POA: Insufficient documentation

## 2016-03-02 DIAGNOSIS — Z713 Dietary counseling and surveillance: Secondary | ICD-10-CM | POA: Insufficient documentation

## 2016-03-02 DIAGNOSIS — Z794 Long term (current) use of insulin: Secondary | ICD-10-CM | POA: Insufficient documentation

## 2016-03-02 DIAGNOSIS — Z6834 Body mass index (BMI) 34.0-34.9, adult: Secondary | ICD-10-CM | POA: Insufficient documentation

## 2016-03-02 DIAGNOSIS — E1122 Type 2 diabetes mellitus with diabetic chronic kidney disease: Secondary | ICD-10-CM | POA: Diagnosis present

## 2016-03-02 DIAGNOSIS — IMO0002 Reserved for concepts with insufficient information to code with codable children: Secondary | ICD-10-CM

## 2016-03-02 DIAGNOSIS — E1322 Other specified diabetes mellitus with diabetic chronic kidney disease: Secondary | ICD-10-CM

## 2016-03-02 DIAGNOSIS — E118 Type 2 diabetes mellitus with unspecified complications: Secondary | ICD-10-CM

## 2016-03-02 DIAGNOSIS — Z91199 Patient's noncompliance with other medical treatment and regimen due to unspecified reason: Secondary | ICD-10-CM

## 2016-03-02 DIAGNOSIS — E782 Mixed hyperlipidemia: Secondary | ICD-10-CM

## 2016-03-02 DIAGNOSIS — E1365 Other specified diabetes mellitus with hyperglycemia: Secondary | ICD-10-CM

## 2016-03-02 DIAGNOSIS — E1165 Type 2 diabetes mellitus with hyperglycemia: Secondary | ICD-10-CM

## 2016-03-02 DIAGNOSIS — Z9119 Patient's noncompliance with other medical treatment and regimen: Secondary | ICD-10-CM | POA: Diagnosis not present

## 2016-03-02 DIAGNOSIS — N184 Chronic kidney disease, stage 4 (severe): Secondary | ICD-10-CM

## 2016-03-02 DIAGNOSIS — I10 Essential (primary) hypertension: Secondary | ICD-10-CM | POA: Diagnosis not present

## 2016-03-02 DIAGNOSIS — E785 Hyperlipidemia, unspecified: Secondary | ICD-10-CM

## 2016-03-02 DIAGNOSIS — E781 Pure hyperglyceridemia: Secondary | ICD-10-CM

## 2016-03-02 NOTE — Patient Instructions (Signed)

## 2016-03-02 NOTE — Progress Notes (Signed)
Medical Nutrition Therapy:  Appt start time: 1030 end time:  1100Assessment:  Primary concerns today: DM and Obesity. A1C 11.2%.  Levemir 80 units and Novolog 15 units TID plus slding scale. She notes she is under a lot of stress and can't seem to get herself on track with her medications and meals.. She misses her meal time insulin and skips meals at times.  She is concerned about her kidney function and doesn't want to have to go on dialysis.  BS are running 200-300's most of the time.  Diet is inconsistent to meet her needs and improve her blood sugars.  Diet is high in saturated fat, salt, and low in fresh fruits, whole grains and high fiber foods. Diet is high in processed foods contributing to her elevated BS, cholesterol and kidney issues.  Going to see heart doctor for her high lipid profile.   CMP Latest Ref Rng & Units 02/28/2016 01/23/2016 01/14/2016  Glucose 65 - 99 mg/dL 141(H) 200(H) 207(H)  BUN 7 - 25 mg/dL 37(H) 49(H) 38(H)  Creatinine 0.50 - 1.05 mg/dL 1.91(H) 2.51(H) 1.94(H)  Sodium 135 - 146 mmol/L 139 137 138  Potassium 3.5 - 5.3 mmol/L 4.6 4.6 5.1  Chloride 98 - 110 mmol/L 106 103 105  CO2 20 - 31 mmol/L 25 23 23   Calcium 8.6 - 10.4 mg/dL 9.8 10.0 9.5  Total Protein 6.1 - 8.1 g/dL 7.1 - 6.3  Total Bilirubin 0.2 - 1.2 mg/dL 0.3 - 0.2  Alkaline Phos 33 - 130 U/L 83 - 84  AST 10 - 35 U/L 18 - 18  ALT 6 - 29 U/L 24 - 20    Lab Results  Component Value Date   HGBA1C 11.2 (H) 02/28/2016   . Lipid Panel     Component Value Date/Time   CHOL 324 (H) 02/28/2016 1114   TRIG 827 (H) 02/28/2016 1114   HDL 53 02/28/2016 1114   CHOLHDL 6.1 (H) 02/28/2016 1114   VLDL NOT CALC 02/28/2016 1114   LDLCALC NOT CALC 02/28/2016 1114   LDLDIRECT 145 (H) 01/14/2016 1033       Lab Results  Component Value Date   HGBA1C 11.2 (H) 02/28/2016   Wt Readings from Last 3 Encounters:  03/02/16 210 lb (95.3 kg)  02/04/16 209 lb 9.6 oz (95.1 kg)  01/29/16 209 lb (94.8 kg)   Ht  Readings from Last 3 Encounters:  03/02/16 5\' 5"  (1.651 m)  02/04/16 5\' 5"  (1.651 m)  01/29/16 5\' 5"  (1.651 m)   There is no height or weight on file to calculate BMI.  Preferred Learning Style:  No preference indicated   Learning Readiness:  Not ready  Contemplating  Ready  Change in progress   MEDICATIONS:   DIETARY INTAKE:    24-hr recall:  B ( AM): Grapes, Coffee OR Eggs, spam, pancake with sygrup, Coffee Snk ( AM): nothing L ( PM)  Skipped  Wasn't feeling good Snk ( PM):  D ( PM):  2 pieces baked pork chop and 2 slice bread, water Snk ( PM):   Beverages:water  Usual physical activity: ADL  Estimated energy needs: 1500 calories 170  g carbohydrates 112 g protein 42 g fat  Progress Towards Goal(s):  In progress.   Nutritional Diagnosis:  NB-1.1 Food and nutrition-related knowledge deficit As related to Diabetes.  As evidenced by A1C 10.2%..    Intervention:  Nutrition and Diabetes education provided on My Plate, CHO counting, meal planning, portion sizes, timing of meals, avoiding snacks  between meals unless having a low blood sugar, target ranges for A1C and blood sugars, signs/symptoms and treatment of hyper/hypoglycemia, monitoring blood sugars, taking medications as prescribed, benefits of exercising 30 minutes per day and prevention of complications of DM.  Marland KitchenGoals Follow My Plate Method Avoid processed foods and salty foods. Eat 3 carb choices per meal Eat meals on times as discussed Do not skip meals Take insulin as prescribed before meals and bedtime-use sliding scale correctly for meal time insulin Drink gallon water daily.  Cut out diet sodas Plan meals ahead and take meals to work Get A1C down to8% in three months Lose 1-2 lbs per week Call if questions.  INcrease low carb vegetables -2 per lunch and dinner daily.  Teaching Method Utilized:  Visual Auditory Hands on  Handouts given during visit include:  The Plate Method  Meal  Plan  Diabetes Instructions.   Barriers to learning/adherence to lifestyle change: NOne  Demonstrated degree of understanding via:  Teach Back   Monitoring/Evaluation:  Dietary intake, exercise, meal planning, SBG, and body weight in 3 month(s).

## 2016-03-02 NOTE — Progress Notes (Signed)
Subjective:    Patient ID: Kathryn Beck, female    DOB: Sep 10, 1955, PCP Kathryn Nakayama, MD   Past Medical History:  Diagnosis Date  . Anemia   . Axillary masses    Soft tissue - status post excision  . Back pain   . Essential hypertension   . Mixed hyperlipidemia   . Obesity   . Sleep apnea    Noncompliant with CPAP  . Type 2 diabetes mellitus, uncontrolled ([place])    Past Surgical History:  Procedure Laterality Date  . ABDOMINAL HYSTERECTOMY    . COLONOSCOPY  2008   Dr. Oneida Alar: normal   . ESOPHAGEAL DILATION N/A 10/13/2015   Procedure: ESOPHAGEAL DILATION;  Surgeon: Rogene Houston, MD;  Location: [facility];  Service: Endoscopy;  Laterality: N/A;  . ESOPHAGOGASTRODUODENOSCOPY N/A 10/13/2015   Dr. Laural Golden: chronic gastritis on path, no H.pylori. Empiric dilation   . FOOT SURGERY Bilateral   . LUNG BIOPSY    . MASS EXCISION Right 01/09/2013   Procedure: EXCISION OF NEOPLASM OF RIGHT  AXILLA  AND EXCISION OF NEOPLASM OF LEFT AXILLA;  Surgeon: Jamesetta So, MD;  Location: [facility];  Service: General;  Laterality: Right;  procedure end @ 08:23   Social History   Social History  . Marital status: Married    Spouse name: N/A  . Number of children: N/A  . Years of education: N/A   Social History Main Topics  . Smoking status: Never Smoker  . Smokeless tobacco: Never Used  . Alcohol use No  . Drug use: No  . Sexual activity: Not Currently   Other Topics Concern  . None   Social History Narrative  . None   Outpatient Encounter Prescriptions as of 03/02/2016  Medication Sig  . ACCU-CHEK FASTCLIX LANCETS MISC USE AS INSTRUCTED THREE  TIMES DAILY  . allopurinol (ZYLOPRIM) 300 MG tablet TAKE 1 TABLET (300 MG TOTAL) BY MOUTH DAILY.  Marland Kitchen amLODipine (NORVASC) 10 MG tablet TAKE 1 TABLET BY MOUTH  DAILY  . azelastine (ASTELIN) 0.1 % nasal spray   . azelastine (ASTELIN) 0.1 % nasal spray PLACE 2 SPRAYS INTO BOTH  NOSTRILS 2 (TWO) TIMES  DAILY AS DIRECTED  . CARAFATE 1  GM/10ML suspension As needed  . cloNIDine (CATAPRES) 0.1 MG tablet TAKE 1 TABLET BY MOUTH  EVERY EVENING AT 9PM FOR  BLOOD PRESSURE  . cyclobenzaprine (FLEXERIL) 10 MG tablet Take 1 tablet (10 mg total) by mouth at bedtime.  . dicyclomine (BENTYL) 10 MG capsule Take 1 capsule (10 mg total) by mouth 4 (four) times daily -  before meals and at bedtime.  Marland Kitchen ezetimibe (ZETIA) 10 MG tablet Take 1 tablet (10 mg total) by mouth daily.  . furosemide (LASIX) 40 MG tablet Take 1 tablet (40 mg total) by mouth 2 (two) times daily.  . insulin aspart (NOVOLOG FLEXPEN) 100 UNIT/ML FlexPen Inject 15-21 Units into the skin 3 (three) times daily with meals.  . Insulin Detemir (LEVEMIR FLEXTOUCH Kathryn Beck) Inject 80 Units into the skin at bedtime.  . ondansetron (ZOFRAN-ODT) 8 MG disintegrating tablet every 8 (eight) hours as needed.   . pantoprazole (PROTONIX) 40 MG tablet Take 1 tablet (40 mg total) by mouth daily. Take 30 minutes prior to breakfast each day.  . pravastatin (PRAVACHOL) 20 MG tablet Take 1 tablet (20 mg total) by mouth daily.  Marland Kitchen spironolactone (ALDACTONE) 25 MG tablet TAKE 1 TABLET BY MOUTH TWO  TIMES DAILY  . triamterene-hydrochlorothiazide (MAXZIDE) 75-50 MG  tablet TAKE 1 TABLET BY MOUTH  DAILY  . Vitamin D, Ergocalciferol, (DRISDOL) 50000 units CAPS capsule every 7 (seven) days.   . [DISCONTINUED] HUMALOG KWIKPEN 100 UNIT/ML KiwkPen INJECT SUBCUTANEOUSLY UP TO 24 UNITS 3 TIMES DAILY  BEFORE MEALS   No facility-administered encounter medications on file as of 03/02/2016.    ALLERGIES: Allergies  Allergen Reactions  . Ace Inhibitors Anaphylaxis and Swelling  . Penicillins Itching and Swelling    Has patient had a PCN reaction causing immediate rash, facial/tongue/throat swelling, SOB or lightheadedness with hypotension: Yes Has patient had a PCN reaction causing severe rash involving mucus membranes or skin necrosis: No Has patient had a PCN reaction that required hospitalization No Has patient had  a PCN reaction occurring within the last 10 years: No If all of the above answers are "NO", then may proceed with Cephalosporin use.   . Statins Other (See Comments)    elevated LFT's   VACCINATION STATUS: Immunization History  Administered Date(s) Administered  . H1N1 01/16/2008  . Influenza Whole 12/28/2004, 02/24/2006, 12/07/2006, 02/01/2008, 01/28/2009, 11/25/2010  . Influenza,inj,Quad PF,36+ Mos 12/22/2012, 03/12/2015, 01/14/2016  . Pneumococcal Polysaccharide-23 01/28/2009, 07/25/2015  . Td 11/12/2003  . Tdap 10/02/2014    Diabetes  She presents for her follow-up diabetic visit. She has type 2 diabetes mellitus. Onset time: Kathryn Beck card was diagnosed with type 2 diabetes at approximate age of 89 years. Her disease course has been worsening (She saw me in 2015 at which time she was given basal/bolus insulin regimen. She subsequently no showed to clinic visits for various reasons including insurance issues.). There are no hypoglycemic associated symptoms. Pertinent negatives for hypoglycemia include no confusion, headaches, pallor or seizures. Associated symptoms include blurred vision, fatigue, polydipsia and polyuria. Pertinent negatives for diabetes include no chest pain and no polyphagia. There are no hypoglycemic complications. Symptoms are worsening. Diabetic complications include nephropathy. Risk factors for coronary artery disease include diabetes mellitus, dyslipidemia, family history, obesity, hypertension and sedentary lifestyle. Current diabetic treatment includes insulin injections (She claims to have been using Levemir 90 units at bedtime and NovoLog 25 units 3 times a day before meals. However, she did not bring any meter nor logs to review today. And she admits that she does not monitor blood glucose regularly.). Her weight is stable. She is following a generally unhealthy diet. When asked about meal planning, she reported none. She has not had a previous visit with a dietitian.  She never participates in exercise. Her overall blood glucose range is >200 mg/dl. (She came with incomplete monitoring, 18 readings in the last 14 days averaging 290.) An ACE inhibitor/angiotensin II receptor blocker is being taken. Eye exam is current.  Hypertension  This is a chronic problem. The current episode started more than 1 year ago. The problem is uncontrolled. Associated symptoms include blurred vision. Pertinent negatives include no chest pain, headaches, palpitations or shortness of breath. Risk factors for coronary artery disease include diabetes mellitus, obesity and family history. Past treatments include angiotensin blockers. Compliance problems include diet, exercise and psychosocial issues.  Hypertensive end-organ damage includes kidney disease.     Review of Systems  Constitutional: Positive for fatigue. Negative for activity change, chills, fever and unexpected weight change.  HENT: Negative for trouble swallowing and voice change.   Eyes: Positive for blurred vision. Negative for visual disturbance.  Respiratory: Negative for cough, shortness of breath and wheezing.   Cardiovascular: Negative for chest pain, palpitations and leg swelling.  Gastrointestinal: Negative for diarrhea,  nausea and vomiting.  Endocrine: Positive for polydipsia and polyuria. Negative for cold intolerance, heat intolerance and polyphagia.  Genitourinary: Positive for frequency. Negative for dysuria and flank pain.  Musculoskeletal: Negative for arthralgias and myalgias.  Skin: Negative for color change, pallor, rash and wound.  Neurological: Negative for seizures and headaches.  Psychiatric/Behavioral: Negative for confusion, dysphoric mood and suicidal ideas.  All other systems reviewed and are negative.   Objective:    BP (!) 156/81   Pulse (!) 118   Ht 5\' 5"  (1.651 m)   Wt 210 lb (95.3 kg)   BMI 34.95 kg/m   Wt Readings from Last 3 Encounters:  03/02/16 210 lb (95.3 kg)  02/04/16 209  lb 9.6 oz (95.1 kg)  01/29/16 209 lb (94.8 kg)    Physical Exam  Constitutional: She is oriented to person, place, and time. She appears well-developed.  HENT:  Head: Normocephalic and atraumatic.  Eyes: EOM are normal.  Neck: Normal range of motion. Neck supple. No tracheal deviation present. No thyromegaly present.  Cardiovascular: Normal rate and regular rhythm.   Pulmonary/Chest: Effort normal and breath sounds normal.  Abdominal: Soft. Bowel sounds are normal. There is no tenderness. There is no guarding.  Musculoskeletal: Normal range of motion. She exhibits no edema.  Neurological: She is alert and oriented to person, place, and time. She has normal reflexes. No cranial nerve deficit. Coordination normal.  Skin: Skin is warm and dry. No rash noted. No erythema. No pallor.  Psychiatric: She has a normal mood and affect. Judgment normal.    CMP     Component Value Date/Time   NA 139 02/28/2016 1114   K 4.6 02/28/2016 1114   CL 106 02/28/2016 1114   CO2 25 02/28/2016 1114   GLUCOSE 141 (H) 02/28/2016 1114   BUN 37 (H) 02/28/2016 1114   CREATININE 1.91 (H) 02/28/2016 1114   CALCIUM 9.8 02/28/2016 1114   PROT 7.1 02/28/2016 1114   ALBUMIN 3.8 02/28/2016 1114   AST 18 02/28/2016 1114   ALT 24 02/28/2016 1114   ALKPHOS 83 02/28/2016 1114   BILITOT 0.3 02/28/2016 1114   GFRNONAA 28 (L) 02/28/2016 1114   GFRAA 33 (L) 02/28/2016 1114     Diabetic Labs (most recent): Lab Results  Component Value Date   HGBA1C 11.2 (H) 02/28/2016   HGBA1C 10.0 (H) 01/14/2016   HGBA1C 10.2 (H) 10/07/2015     Lipid Panel ( most recent) Lipid Panel     Component Value Date/Time   CHOL 324 (H) 02/28/2016 1114   TRIG 827 (H) 02/28/2016 1114   HDL 53 02/28/2016 1114   CHOLHDL 6.1 (H) 02/28/2016 1114   VLDL NOT CALC 02/28/2016 1114   LDLCALC NOT CALC 02/28/2016 1114   LDLDIRECT 145 (H) 01/14/2016 1033       Assessment & Plan:   1. Uncontrolled type 2 diabetes mellitus with stage 3  chronic kidney disease, with long-term current use of insulin ([facility]) Her diabetes is  complicated by Chronic kidney disease and patient remains at a high risk for more acute and chronic complications of diabetes which include CAD, CVA, CKD, retinopathy, and neuropathy. These are all discussed in detail with the patient.   - Her A1c is higher at 11.2% increasing from 10.2% last visit.  -   Recent labs reviewed.   - I have re-counseled the patient on diet management and weight loss  by adopting a carbohydrate restricted / protein rich  Diet.  - Suggestion is made for  patient to avoid simple carbohydrates   from their diet including Cakes , Desserts, Ice Cream,  Soda (  diet and regular) , Sweet Tea , Candies,  Chips, Cookies, Artificial Sweeteners,   and "Sugar-free" Products .  This will help patient to have stable blood glucose profile and potentially avoid unintended  Weight gain.  - Patient is advised to stick to a routine mealtimes to eat 3 meals  a day and avoid unnecessary snacks ( to snack only to correct hypoglycemia).  - The patient  will be  scheduled with Jearld Fenton, RDN, CDE for individualized DM education.  - I have approached patient with the following individualized plan to manage diabetes and patient agrees.  - Unfortunately, Kathryn Beck reverses back to serious noncompliance/nonadherence. She monitored only 18 times in the last 14 days which would not be enough for her to use insulin 70 and optimally.  - There is no need to adjust her insulin dose until she sees the effect of the prescribed insulin regimen based on the current schedule.   - I advised her to continue Levemir  80 units daily at bedtime, continue NovoLog  15 units 3 times a day before meals for pre-meal BG readings of 90-150mg /dl, plus patient specific correction dose of rapid acting insulin  for unexpected hyperglycemia above 150mg /dl, associated with strict monitoring of glucose  AC and HS. - Patient is warned  not to take insulin without proper monitoring per orders. -Adjustment parameters are given for hypo and hyperglycemia in writing.  -Patient is encouraged to call clinic for blood glucose levels less than 70 or above 300 mg /dl.  -Patient is not a candidate for SGLT 2 inhibitors nor metformin due to CKD.  - Patient will be considered for incretin therapy as appropriate next visit. - Patient specific target  for A1c; LDL, HDL, Triglycerides, and  Waist Circumference were discussed in detail.  2) BP/HTN:  Uncontrolled. Continue current medications including ARB. 3) Lipids/HPL: Uncontrolled, due to high triglycerides in 700s her LDL was not calculated. She reports intolerance to statins. 4)  Weight/Diet: CDE consult in progress, exercise, and carbohydrates information provided.  5) Chronic Care/Health Maintenance:  -Patient is on ARB medications and encouraged to continue to follow up with Ophthalmology, Podiatrist at least yearly or according to recommendations, and advised to  stay away from smoking. I have recommended yearly flu vaccine and pneumonia vaccination at least every 5 years; moderate intensity exercise for up to 150 minutes weekly; and  sleep for at least 7 hours a day.  - 25 minutes of time was spent on the care of this patient , 50% of which was applied for counseling on diabetes complications and their preventions.  - I advised patient to maintain close follow up with Kathryn Nakayama, MD for primary care needs.  Patient is asked to bring meter and  blood glucose logs during their next visit.   Follow up plan: -Return in about 3 months (around 05/31/2016) for follow up with pre-visit labs, meter, and logs.  Glade Lloyd, MD Phone: [phone]  Fax: [phone]   03/02/2016, 10:34 AM

## 2016-03-02 NOTE — Patient Instructions (Addendum)
Goals Follow My Plate Method Avoid processed foods and salty foods. Eat 3 carb choices per meal Eat meals on times as discussed Do not skip meals Take insulin as prescribed before meals and bedtime-use sliding scale correctly for meal time insulin Drink gallon water daily.  Cut out diet sodas Plan meals ahead and take meals to work Get A1C down to8% in three months Lose 1-2 lbs per week Call if questions.  INcrease low carb vegetables -2 per lunch and dinner daily.

## 2016-03-04 ENCOUNTER — Other Ambulatory Visit: Payer: Self-pay | Admitting: Family Medicine

## 2016-03-04 ENCOUNTER — Ambulatory Visit (HOSPITAL_COMMUNITY)
Admission: RE | Admit: 2016-03-04 | Discharge: 2016-03-04 | Disposition: A | Discharge status: Home / Self Care | Payer: 59 | Source: Ambulatory Visit | Attending: Cardiovascular Disease | Admitting: Cardiovascular Disease

## 2016-03-04 ENCOUNTER — Other Ambulatory Visit: Payer: Self-pay | Admitting: "Endocrinology

## 2016-03-04 DIAGNOSIS — I10 Essential (primary) hypertension: Secondary | ICD-10-CM | POA: Diagnosis not present

## 2016-03-04 NOTE — Progress Notes (Signed)
*  PRELIMINARY RESULTS* Echocardiogram 2D Echocardiogram has been performed.  Leavy Cella 03/04/2016, 12:06 PM

## 2016-03-09 ENCOUNTER — Ambulatory Visit: Payer: 59 | Admitting: "Endocrinology

## 2016-04-02 ENCOUNTER — Ambulatory Visit: Payer: 59 | Admitting: Nutrition

## 2016-04-16 ENCOUNTER — Ambulatory Visit: Payer: 59 | Admitting: Nutrition

## 2016-04-16 LAB — HEPATIC FUNCTION PANEL
ALT: 20 U/L (ref 6–29)
AST: 17 U/L (ref 10–35)
Albumin: 3.6 g/dL (ref 3.6–5.1)
Alkaline Phosphatase: 79 U/L (ref 33–130)
BILIRUBIN DIRECT: 0 mg/dL (ref <=0.2)
BILIRUBIN INDIRECT: 0.2 mg/dL (ref 0.2–1.2)
BILIRUBIN TOTAL: 0.2 mg/dL (ref 0.2–1.2)
Total Protein: 6.5 g/dL (ref 6.1–8.1)

## 2016-04-21 ENCOUNTER — Other Ambulatory Visit: Payer: Self-pay | Admitting: Family Medicine

## 2016-04-27 ENCOUNTER — Other Ambulatory Visit: Payer: Self-pay | Admitting: Family Medicine

## 2016-04-27 ENCOUNTER — Telehealth: Payer: Self-pay | Admitting: Gastroenterology

## 2016-04-27 NOTE — Telephone Encounter (Signed)
Recall for tcs °

## 2016-04-28 NOTE — Telephone Encounter (Signed)
Letter mailed to pt.  

## 2016-05-19 ENCOUNTER — Other Ambulatory Visit: Payer: Self-pay | Admitting: Family Medicine

## 2016-05-20 ENCOUNTER — Other Ambulatory Visit: Payer: Self-pay | Admitting: Family Medicine

## 2016-05-20 ENCOUNTER — Other Ambulatory Visit: Payer: Self-pay | Admitting: Urology

## 2016-05-20 ENCOUNTER — Other Ambulatory Visit: Payer: Self-pay | Admitting: "Endocrinology

## 2016-05-20 DIAGNOSIS — N281 Cyst of kidney, acquired: Secondary | ICD-10-CM

## 2016-05-20 LAB — LIPID PANEL
CHOL/HDL RATIO: 5.5 ratio — AB (ref <5.0)
Cholesterol: 249 mg/dL — ABNORMAL HIGH (ref <200)
HDL: 45 mg/dL — ABNORMAL LOW (ref >50)
TRIGLYCERIDES: 674 mg/dL — AB (ref <150)

## 2016-05-20 LAB — LDL CHOLESTEROL, DIRECT: LDL DIRECT: 107 mg/dL (ref <130)

## 2016-05-28 ENCOUNTER — Ambulatory Visit: Payer: 59 | Admitting: Family Medicine

## 2016-06-04 ENCOUNTER — Encounter: Payer: 59 | Attending: Family Medicine | Admitting: Nutrition

## 2016-06-04 ENCOUNTER — Ambulatory Visit: Payer: 59 | Admitting: "Endocrinology

## 2016-06-04 VITALS — Ht 64.0 in | Wt 213.0 lb

## 2016-06-04 DIAGNOSIS — N183 Chronic kidney disease, stage 3 (moderate): Secondary | ICD-10-CM | POA: Insufficient documentation

## 2016-06-04 DIAGNOSIS — Z713 Dietary counseling and surveillance: Secondary | ICD-10-CM | POA: Diagnosis not present

## 2016-06-04 DIAGNOSIS — Z794 Long term (current) use of insulin: Secondary | ICD-10-CM

## 2016-06-04 DIAGNOSIS — E1121 Type 2 diabetes mellitus with diabetic nephropathy: Secondary | ICD-10-CM

## 2016-06-04 DIAGNOSIS — E1122 Type 2 diabetes mellitus with diabetic chronic kidney disease: Secondary | ICD-10-CM | POA: Insufficient documentation

## 2016-06-04 DIAGNOSIS — Z6836 Body mass index (BMI) 36.0-36.9, adult: Secondary | ICD-10-CM | POA: Diagnosis not present

## 2016-06-04 NOTE — Progress Notes (Signed)
Medical Nutrition Therapy:  Appt start time: 1050 am end time:  1100 Assessment:  Primary concerns today: DM and Obesity. Wt up today.. Holding fluid. Saw kidney doctor yesterday Dr. Curt Bears and he is going to adjust her fluid pills.  Meter readings: 7 day avg 341 mg/dl and  14 days 349 mg/dl and 30 day 325 mg/dl.  Was suppose to get her labs for Dr. Dorris Fetch and missed appt to get it done. Admits to symptoms of fatigue, increased thirst, frequent urination, hunger and some depression. Needs better compliance with medications for improved blood sugars.     Is looking into gastric bypass with [facility]. Went thru the information session and working on taking care of paperwork now. She is working on eating healthier meals slowly. Doesn't have an appetite at times. Levemir 80 units once and Novolog 15 units with sliding scales  with meals. Has been missing doses, forgets her insulin pen at home and often doesn't get insulin at lunch or dinner. Uses sliding scale when she remembers her insulin. Sometimes forgets evening dose.   She admits she noticed tea and lemonade run her BS up. Now drinking only water.     Lipid profile is elevated.  Due to underlying depressed feelings, she would benefit from a referral to see a behavioral counselor.  Lipid Panel     Component Value Date/Time   CHOL 249 (H) 05/19/2016 1344   TRIG 674 (H) 05/19/2016 1344   HDL 45 (L) 05/19/2016 1344   CHOLHDL 5.5 (H) 05/19/2016 1344   VLDL NOT CALC 05/19/2016 1344   LDLCALC NOT CALC 05/19/2016 1344   LDLDIRECT 107 05/19/2016 1344    CMP Latest Ref Rng & Units 04/15/2016 02/28/2016 01/23/2016  Glucose 65 - 99 mg/dL - 141(H) 200(H)  BUN 7 - 25 mg/dL - 37(H) 49(H)  Creatinine 0.50 - 1.05 mg/dL - 1.91(H) 2.51(H)  Sodium 135 - 146 mmol/L - 139 137  Potassium 3.5 - 5.3 mmol/L - 4.6 4.6  Chloride 98 - 110 mmol/L - 106 103  CO2 20 - 31 mmol/L - 25 23  Calcium 8.6 - 10.4 mg/dL - 9.8 10.0  Total Protein 6.1 - 8.1 g/dL 6.5 7.1 -  Total  Bilirubin 0.2 - 1.2 mg/dL 0.2 0.3 -  Alkaline Phos 33 - 130 U/L 79 83 -  AST 10 - 35 U/L 17 18 -  ALT 6 - 29 U/L 20 24 -    Lab Results  Component Value Date   HGBA1C 11.2 (H) 02/28/2016   . Lipid Panel     Component Value Date/Time   CHOL 249 (H) 05/19/2016 1344   TRIG 674 (H) 05/19/2016 1344   HDL 45 (L) 05/19/2016 1344   CHOLHDL 5.5 (H) 05/19/2016 1344   VLDL NOT CALC 05/19/2016 1344   LDLCALC NOT CALC 05/19/2016 1344   LDLDIRECT 107 05/19/2016 1344       Lab Results  Component Value Date   HGBA1C 11.2 (H) 02/28/2016   Wt Readings from Last 3 Encounters:  03/02/16 210 lb (95.3 kg)  03/02/16 210 lb (95.3 kg)  02/04/16 209 lb 9.6 oz (95.1 kg)   Ht Readings from Last 3 Encounters:  03/02/16 5\' 5"  (1.651 m)  03/02/16 5\' 5"  (1.651 m)  02/04/16 5\' 5"  (1.651 m)    Preferred Learning Style:  No preference indicated   Learning Readiness:  Not ready  Contemplating  Ready  Change in progress   MEDICATIONS:   DIETARY INTAKE:    24-hr recall:  B ( AM): Generic breakfast drink or oatmeal, fruit or yogurt or egg   Snk ( AM): nothing L ( PM)   [place] sandwich with lettuce/tomato on white bread, 1 c yogurt dannon light, water Snk ( PM):  D ( PM):  Flounder fried, 1 slice bread, water Snk ( PM):   Beverages:water  Usual physical activity: ADL  Estimated energy needs: 1500 calories 170  g carbohydrates 112 g protein 42 g fat  Progress Towards Goal(s):  In progress.   Nutritional Diagnosis:  NB-1.1 Food and nutrition-related knowledge deficit As related to Diabetes.  As evidenced by A1C 10.2%..    Intervention:  Nutrition and Diabetes education provided on My Plate, CHO counting, meal planning, portion sizes, timing of meals, avoiding snacks between meals unless having a low blood sugar, target ranges for A1C and blood sugars, signs/symptoms and treatment of hyper/hypoglycemia, monitoring blood sugars, taking medications as prescribed, benefits of  exercising 30 minutes per day and prevention of complications of DM. Low Salt Heart Healthy Diet, importance of medication compliance and reducing complications to kidneys.  Goals 1. Take meal time insulin with you to work every day 2. Don't skip doses of insuiln or skip meals 3. Drink water only and no lemonaide or juices 4. Increase low carb vegetables. Get lab work done in the next  Week for Dr. Dorris Fetch Bring meter and BS log with you to all appts   Teaching Method Utilized:  Visual Auditory Hands on  Handouts given during visit include:  The Plate Method  Meal Plan  Diabetes Instructions.   Barriers to learning/adherence to lifestyle change: NOne  Demonstrated degree of understanding via:  Teach Back   Monitoring/Evaluation:  Dietary intake, exercise, meal planning, SBG, and body weight in 3 month(s) Recommend referral to a behavioral counseling to address depressed feeling and improved self care.Marland Kitchen

## 2016-06-04 NOTE — Patient Instructions (Signed)
Goals 1. Take meal time insulin with you to work every day 2. Don't skip doses of insuiln or skip meals 3. Drink water only and no lemonaide or juices 4. Increase low carb vegetables. Get lab work done in the next  Week for Dr. Dorris Fetch Bring meter and BS log with you to all appts

## 2016-06-09 ENCOUNTER — Other Ambulatory Visit: Payer: Self-pay | Admitting: "Endocrinology

## 2016-06-09 ENCOUNTER — Ambulatory Visit (HOSPITAL_COMMUNITY)
Admission: RE | Admit: 2016-06-09 | Discharge: 2016-06-09 | Disposition: A | Discharge status: Home / Self Care | Payer: 59 | Source: Ambulatory Visit | Attending: Urology | Admitting: Urology

## 2016-06-09 DIAGNOSIS — N281 Cyst of kidney, acquired: Secondary | ICD-10-CM | POA: Insufficient documentation

## 2016-06-10 LAB — COMPREHENSIVE METABOLIC PANEL
ALT: 24 U/L (ref 6–29)
AST: 19 U/L (ref 10–35)
Albumin: 4.1 g/dL (ref 3.6–5.1)
Alkaline Phosphatase: 79 U/L (ref 33–130)
BILIRUBIN TOTAL: 0.3 mg/dL (ref 0.2–1.2)
BUN: 47 mg/dL — AB (ref 7–25)
CALCIUM: 10.1 mg/dL (ref 8.6–10.4)
CHLORIDE: 103 mmol/L (ref 98–110)
CO2: 18 mmol/L — AB (ref 20–31)
CREATININE: 2.43 mg/dL — AB (ref 0.50–0.99)
Glucose, Bld: 253 mg/dL — ABNORMAL HIGH (ref 65–99)
Potassium: 5.9 mmol/L — ABNORMAL HIGH (ref 3.5–5.3)
Sodium: 134 mmol/L — ABNORMAL LOW (ref 135–146)
TOTAL PROTEIN: 7.3 g/dL (ref 6.1–8.1)

## 2016-06-10 LAB — HEMOGLOBIN A1C
Hgb A1c MFr Bld: 11.1 % — ABNORMAL HIGH (ref <5.7)
Mean Plasma Glucose: 272 mg/dL

## 2016-06-16 ENCOUNTER — Ambulatory Visit: Payer: 59 | Admitting: Urology

## 2016-06-17 ENCOUNTER — Ambulatory Visit (INDEPENDENT_AMBULATORY_CARE_PROVIDER_SITE_OTHER): Payer: 59 | Admitting: "Endocrinology

## 2016-06-17 ENCOUNTER — Encounter: Payer: Self-pay | Admitting: "Endocrinology

## 2016-06-17 VITALS — BP 147/98 | HR 91 | Ht 65.0 in | Wt 212.0 lb

## 2016-06-17 DIAGNOSIS — IMO0002 Reserved for concepts with insufficient information to code with codable children: Secondary | ICD-10-CM

## 2016-06-17 DIAGNOSIS — I10 Essential (primary) hypertension: Secondary | ICD-10-CM

## 2016-06-17 DIAGNOSIS — E1322 Other specified diabetes mellitus with diabetic chronic kidney disease: Secondary | ICD-10-CM

## 2016-06-17 DIAGNOSIS — N184 Chronic kidney disease, stage 4 (severe): Secondary | ICD-10-CM

## 2016-06-17 DIAGNOSIS — E782 Mixed hyperlipidemia: Secondary | ICD-10-CM

## 2016-06-17 DIAGNOSIS — E1365 Other specified diabetes mellitus with hyperglycemia: Secondary | ICD-10-CM | POA: Diagnosis not present

## 2016-06-17 MED ORDER — INSULIN DETEMIR 100 UNIT/ML FLEXPEN: 100.0000 [IU] | PEN_INJECTOR | Freq: Every day | SUBCUTANEOUS | 2 refills | Status: DC

## 2016-06-17 NOTE — Progress Notes (Signed)
Subjective:    Patient ID: Kathryn Beck, female    DOB: 04-21-1955, PCP Tula Nakayama, MD   Past Medical History:  Diagnosis Date  . Anemia   . Axillary masses    Soft tissue - status post excision  . Back pain   . Essential hypertension   . Mixed hyperlipidemia   . Obesity   . Sleep apnea    Noncompliant with CPAP  . Type 2 diabetes mellitus, uncontrolled ([place])    Past Surgical History:  Procedure Laterality Date  . ABDOMINAL HYSTERECTOMY    . COLONOSCOPY  2008   Dr. Oneida Alar: normal   . ESOPHAGEAL DILATION N/A 10/13/2015   Procedure: ESOPHAGEAL DILATION;  Surgeon: Rogene Houston, MD;  Location: [facility];  Service: Endoscopy;  Laterality: N/A;  . ESOPHAGOGASTRODUODENOSCOPY N/A 10/13/2015   Dr. Laural Golden: chronic gastritis on path, no H.pylori. Empiric dilation   . FOOT SURGERY Bilateral   . LUNG BIOPSY    . MASS EXCISION Right 01/09/2013   Procedure: EXCISION OF NEOPLASM OF RIGHT  AXILLA  AND EXCISION OF NEOPLASM OF LEFT AXILLA;  Surgeon: Jamesetta So, MD;  Location: [facility];  Service: General;  Laterality: Right;  procedure end @ 08:23   Social History   Social History  . Marital status: Married    Spouse name: N/A  . Number of children: N/A  . Years of education: N/A   Social History Main Topics  . Smoking status: Never Smoker  . Smokeless tobacco: Never Used  . Alcohol use No  . Drug use: No  . Sexual activity: Not Currently   Other Topics Concern  . None   Social History Narrative  . None   Outpatient Encounter Prescriptions as of 06/17/2016  Medication Sig  . ACCU-CHEK AVIVA PLUS test strip USE 4 TIMES DAILY  . ACCU-CHEK FASTCLIX LANCETS MISC USE AS INSTRUCTED THREE  TIMES DAILY  . allopurinol (ZYLOPRIM) 300 MG tablet TAKE 1 TABLET (300 MG TOTAL) BY MOUTH DAILY.  Marland Kitchen amLODipine (NORVASC) 10 MG tablet TAKE 1 TABLET BY MOUTH  DAILY  . azelastine (ASTELIN) 0.1 % nasal spray   . azelastine (ASTELIN) 0.1 % nasal spray PLACE 2 SPRAYS INTO BOTH   NOSTRILS 2 (TWO) TIMES  DAILY AS DIRECTED  . azelastine (ASTELIN) 0.1 % nasal spray USE 2 SPRAYS INTO BOTH  NOSTRILS 2 (TWO) TIMES  DAILY AS DIRECTED  . B-D UF III MINI PEN NEEDLES 31G X 5 MM MISC USE AS INSTRUCTED THREE  TIMES DAILY  . CARAFATE 1 GM/10ML suspension As needed  . cloNIDine (CATAPRES) 0.1 MG tablet TAKE 1 TABLET BY MOUTH  EVERY EVENING AT 9PM FOR  BLOOD PRESSURE  . cyclobenzaprine (FLEXERIL) 10 MG tablet Take 1 tablet (10 mg total) by mouth at bedtime.  . dicyclomine (BENTYL) 10 MG capsule Take 1 capsule (10 mg total) by mouth 4 (four) times daily -  before meals and at bedtime.  Marland Kitchen ezetimibe (ZETIA) 10 MG tablet Take 1 tablet (10 mg total) by mouth daily.  . furosemide (LASIX) 40 MG tablet Take 1 tablet (40 mg total) by mouth 2 (two) times daily.  Marland Kitchen HUMALOG KWIKPEN 100 UNIT/ML KiwkPen INJECT SUBCUTANEOUSLY UP TO 24 UNITS 3 TIMES DAILY  BEFORE MEALS  . insulin aspart (NOVOLOG FLEXPEN) 100 UNIT/ML FlexPen Inject 15-21 Units into the skin 3 (three) times daily with meals.  . Insulin Detemir (LEVEMIR FLEXTOUCH) 100 UNIT/ML Pen Inject 100 Units into the skin at bedtime.  . ondansetron (  ZOFRAN-ODT) 8 MG disintegrating tablet every 8 (eight) hours as needed.   . pantoprazole (PROTONIX) 40 MG tablet Take 1 tablet (40 mg total) by mouth daily. Take 30 minutes prior to breakfast each day.  . pravastatin (PRAVACHOL) 20 MG tablet Take 1 tablet (20 mg total) by mouth daily.  Marland Kitchen spironolactone (ALDACTONE) 25 MG tablet TAKE 1 TABLET BY MOUTH TWO  TIMES DAILY  . triamterene-hydrochlorothiazide (MAXZIDE) 75-50 MG tablet TAKE 1 TABLET BY MOUTH  DAILY  . Vitamin D, Ergocalciferol, (DRISDOL) 50000 units CAPS capsule every 7 (seven) days.   . [DISCONTINUED] Insulin Detemir (LEVEMIR FLEXTOUCH [place]) Inject 100 Units into the skin at bedtime.   No facility-administered encounter medications on file as of 06/17/2016.    ALLERGIES: Allergies  Allergen Reactions  . Ace Inhibitors Anaphylaxis and Swelling   . Penicillins Itching and Swelling    Has patient had a PCN reaction causing immediate rash, facial/tongue/throat swelling, SOB or lightheadedness with hypotension: Yes Has patient had a PCN reaction causing severe rash involving mucus membranes or skin necrosis: No Has patient had a PCN reaction that required hospitalization No Has patient had a PCN reaction occurring within the last 10 years: No If all of the above answers are "NO", then may proceed with Cephalosporin use.   . Statins Other (See Comments)    elevated LFT's   VACCINATION STATUS: Immunization History  Administered Date(s) Administered  . H1N1 01/16/2008  . Influenza Whole 12/28/2004, 02/24/2006, 12/07/2006, 02/01/2008, 01/28/2009, 11/25/2010  . Influenza,inj,Quad PF,36+ Mos 12/22/2012, 03/12/2015, 01/14/2016  . Pneumococcal Polysaccharide-23 01/28/2009, 07/25/2015  . Td 11/12/2003  . Tdap 10/02/2014    Diabetes  She presents for her follow-up diabetic visit. She has type 2 diabetes mellitus. Onset time: Kathryn Beck card was diagnosed with type 2 diabetes at approximate age of 36 years. Her disease course has been worsening (She saw me in 2015 at which time she was given basal/bolus insulin regimen. She subsequently no showed to clinic visits for various reasons including insurance issues.). There are no hypoglycemic associated symptoms. Pertinent negatives for hypoglycemia include no confusion, headaches, pallor or seizures. Associated symptoms include blurred vision, fatigue, polydipsia and polyuria. Pertinent negatives for diabetes include no chest pain and no polyphagia. There are no hypoglycemic complications. Symptoms are worsening. Diabetic complications include nephropathy. Risk factors for coronary artery disease include diabetes mellitus, dyslipidemia, family history, obesity, hypertension and sedentary lifestyle. Current diabetic treatment includes insulin injections (She claims to have been using Levemir 90 units at  bedtime and NovoLog 25 units 3 times a day before meals. However, she did not bring any meter nor logs to review today. And she admits that she does not monitor blood glucose regularly.). Her weight is stable. She is following a generally unhealthy diet. When asked about meal planning, she reported none. She has not had a previous visit with a dietitian. She never participates in exercise. Her overall blood glucose range is >200 mg/dl. (She came with incomplete monitoring, 2-3 times a day. Her average blood glucose is 218. She misses at least a third of her insulin opportunities. ) An ACE inhibitor/angiotensin II receptor blocker is being taken. Eye exam is current.  Hypertension  This is a chronic problem. The current episode started more than 1 year ago. The problem is uncontrolled. Associated symptoms include blurred vision. Pertinent negatives include no chest pain, headaches, palpitations or shortness of breath. Risk factors for coronary artery disease include diabetes mellitus, obesity and family history. Past treatments include angiotensin blockers. Compliance  problems include diet, exercise and psychosocial issues.  Hypertensive end-organ damage includes kidney disease.     Review of Systems  Constitutional: Positive for fatigue. Negative for activity change, chills, fever and unexpected weight change.  HENT: Negative for trouble swallowing and voice change.   Eyes: Positive for blurred vision. Negative for visual disturbance.  Respiratory: Negative for cough, shortness of breath and wheezing.   Cardiovascular: Negative for chest pain, palpitations and leg swelling.  Gastrointestinal: Negative for diarrhea, nausea and vomiting.  Endocrine: Positive for polydipsia and polyuria. Negative for cold intolerance, heat intolerance and polyphagia.  Genitourinary: Positive for frequency. Negative for dysuria and flank pain.  Musculoskeletal: Negative for arthralgias and myalgias.  Skin: Negative for  color change, pallor, rash and wound.  Neurological: Negative for seizures and headaches.  Psychiatric/Behavioral: Negative for confusion, dysphoric mood and suicidal ideas.  All other systems reviewed and are negative.   Objective:    BP (!) 147/98   Pulse 91   Ht 5\' 5"  (1.651 m)   Wt 212 lb (96.2 kg)   BMI 35.28 kg/m   Wt Readings from Last 3 Encounters:  06/17/16 212 lb (96.2 kg)  06/04/16 213 lb (96.6 kg)  03/02/16 210 lb (95.3 kg)    Physical Exam  Constitutional: She is oriented to person, place, and time. She appears well-developed.  HENT:  Head: Normocephalic and atraumatic.  Eyes: EOM are normal.  Neck: Normal range of motion. Neck supple. No tracheal deviation present. No thyromegaly present.  Cardiovascular: Normal rate and regular rhythm.   Pulmonary/Chest: Effort normal and breath sounds normal.  Abdominal: Soft. Bowel sounds are normal. There is no tenderness. There is no guarding.  Musculoskeletal: Normal range of motion. She exhibits no edema.  Neurological: She is alert and oriented to person, place, and time. She has normal reflexes. No cranial nerve deficit. Coordination normal.  Skin: Skin is warm and dry. No rash noted. No erythema. No pallor.  Psychiatric: She has a normal mood and affect. Judgment normal.    CMP     Component Value Date/Time   NA 134 (L) 06/09/2016 1450   K 5.9 (H) 06/09/2016 1450   CL 103 06/09/2016 1450   CO2 18 (L) 06/09/2016 1450   GLUCOSE 253 (H) 06/09/2016 1450   BUN 47 (H) 06/09/2016 1450   CREATININE 2.43 (H) 06/09/2016 1450   CALCIUM 10.1 06/09/2016 1450   PROT 7.3 06/09/2016 1450   ALBUMIN 4.1 06/09/2016 1450   AST 19 06/09/2016 1450   ALT 24 06/09/2016 1450   ALKPHOS 79 06/09/2016 1450   BILITOT 0.3 06/09/2016 1450   GFRNONAA 28 (L) 02/28/2016 1114   GFRAA 33 (L) 02/28/2016 1114     Diabetic Labs (most recent): Lab Results  Component Value Date   HGBA1C 11.1 (H) 06/09/2016   HGBA1C 11.2 (H) 02/28/2016    HGBA1C 10.0 (H) 01/14/2016     Lipid Panel ( most recent) Lipid Panel     Component Value Date/Time   CHOL 249 (H) 05/19/2016 1344   TRIG 674 (H) 05/19/2016 1344   HDL 45 (L) 05/19/2016 1344   CHOLHDL 5.5 (H) 05/19/2016 1344   VLDL NOT CALC 05/19/2016 1344   LDLCALC NOT CALC 05/19/2016 1344   LDLDIRECT 107 05/19/2016 1344       Assessment & Plan:   1. Uncontrolled type 2 diabetes mellitus with stage 3 chronic kidney disease, with long-term current use of insulin ([place]) Her diabetes is  complicated by Chronic kidney disease and patient  remains at a high risk for more acute and chronic complications of diabetes which include CAD, CVA, CKD, retinopathy, and neuropathy. These are all discussed in detail with the patient.   - Her A1c is higher at 11.1% increasing from 10.2% last visit.  -   Recent labs reviewed.   - I have re-counseled the patient on diet management and weight loss  by adopting a carbohydrate restricted / protein rich  Diet.  - Suggestion is made for patient to avoid simple carbohydrates   from their diet including Cakes , Desserts, Ice Cream,  Soda (  diet and regular) , Sweet Tea , Candies,  Chips, Cookies, Artificial Sweeteners,   and "Sugar-free" Products .  This will help patient to have stable blood glucose profile and potentially avoid unintended  Weight gain.  - Patient is advised to stick to a routine mealtimes to eat 3 meals  a day and avoid unnecessary snacks ( to snack only to correct hypoglycemia).  - The patient  will be  scheduled with Jearld Fenton, RDN, CDE for individualized DM education.  - I have approached patient with the following individualized plan to manage diabetes and patient agrees.  - Unfortunately, Ms. Cindi Carbon Remains noncompliant to dietary recommendations and monitoring schedule.  - She misses at least a third of her insulin opportunities.   - I advised her to increase Levemir  to 100 units daily at bedtime, increase NovoLog to 20   units 3 times a day before meals for pre-meal BG readings of 90-150mg /dl, plus patient specific correction dose of rapid acting insulin  for unexpected hyperglycemia above 150mg /dl, associated with strict monitoring of glucose  AC and HS. - Patient is warned not to take insulin without proper monitoring per orders. -Adjustment parameters are given for hypo and hyperglycemia in writing.  -Patient is encouraged to call clinic for blood glucose levels less than 70 or above 300 mg /dl.  -Patient is not a candidate for SGLT 2 inhibitors nor metformin due to CKD.  - Patient will be considered for incretin therapy as appropriate next visit. - Patient specific target  for A1c; LDL, HDL, Triglycerides, and  Waist Circumference were discussed in detail.  2) BP/HTN:  Uncontrolled. Continue current medications including ARB. 3) Lipids/HPL: Uncontrolled, due to high triglycerides in 700s her LDL was not calculated. She reports intolerance to statins. 4)  Weight/Diet: CDE consult in progress, exercise, and carbohydrates information provided.  5) Chronic Care/Health Maintenance:  -Patient is on ARB medications and encouraged to continue to follow up with Ophthalmology, Podiatrist at least yearly or according to recommendations, and advised to  stay away from smoking. I have recommended yearly flu vaccine and pneumonia vaccination at least every 5 years; moderate intensity exercise for up to 150 minutes weekly; and  sleep for at least 7 hours a day.  - 25 minutes of time was spent on the care of this patient , 50% of which was applied for counseling on diabetes complications and their preventions.  - I advised patient to maintain close follow up with Tula Nakayama, MD for primary care needs.  Patient is asked to bring meter and  blood glucose logs during their next visit.   Follow up plan: -Return in about 3 months (around 09/17/2016) for follow up with pre-visit labs, meter, and logs.  Glade Lloyd,  MD Phone: [phone]  Fax: [phone]   06/17/2016, 3:43 PM

## 2016-06-18 ENCOUNTER — Ambulatory Visit (INDEPENDENT_AMBULATORY_CARE_PROVIDER_SITE_OTHER): Payer: 59 | Admitting: Family Medicine

## 2016-06-18 ENCOUNTER — Encounter: Payer: Self-pay | Admitting: Family Medicine

## 2016-06-18 VITALS — BP 168/90 | HR 84 | Resp 14 | Ht 65.0 in | Wt 214.0 lb

## 2016-06-18 DIAGNOSIS — E782 Mixed hyperlipidemia: Secondary | ICD-10-CM | POA: Diagnosis not present

## 2016-06-18 DIAGNOSIS — H9201 Otalgia, right ear: Secondary | ICD-10-CM

## 2016-06-18 DIAGNOSIS — IMO0002 Reserved for concepts with insufficient information to code with codable children: Secondary | ICD-10-CM

## 2016-06-18 DIAGNOSIS — G4733 Obstructive sleep apnea (adult) (pediatric): Secondary | ICD-10-CM | POA: Diagnosis not present

## 2016-06-18 DIAGNOSIS — I10 Essential (primary) hypertension: Secondary | ICD-10-CM

## 2016-06-18 DIAGNOSIS — N184 Chronic kidney disease, stage 4 (severe): Secondary | ICD-10-CM

## 2016-06-18 DIAGNOSIS — E790 Hyperuricemia without signs of inflammatory arthritis and tophaceous disease: Secondary | ICD-10-CM

## 2016-06-18 DIAGNOSIS — E1322 Other specified diabetes mellitus with diabetic chronic kidney disease: Secondary | ICD-10-CM

## 2016-06-18 DIAGNOSIS — R6889 Other general symptoms and signs: Secondary | ICD-10-CM

## 2016-06-18 DIAGNOSIS — D509 Iron deficiency anemia, unspecified: Secondary | ICD-10-CM

## 2016-06-18 DIAGNOSIS — E1365 Other specified diabetes mellitus with hyperglycemia: Secondary | ICD-10-CM

## 2016-06-18 MED ORDER — ROSUVASTATIN CALCIUM 5 MG PO TABS: 5.0000 mg | ORAL_TABLET | Freq: Every day | ORAL | 3 refills | Status: DC

## 2016-06-18 MED ORDER — LABETALOL HCL 200 MG PO TABS: 200.0000 mg | ORAL_TABLET | Freq: Two times a day (BID) | ORAL | 3 refills | Status: DC

## 2016-06-18 NOTE — Patient Instructions (Addendum)
f/u early May, call if you need me sooner  New for blood pressure is labetalol one twice daily, continue other medication  You are referred to ENT re ear pain  New for cholesterol is crestor, continue zetia  NEED to test and record as directed , call Dr Dorris Fetch if numbers are not good , he will let you know what to do  You are being referred back to the heart specialist , I will also be in touch with him  Non fasting cmp and eGFR, tSH, cBC and anemia panel, vitamin D , uric acid 1 week before follow  Need to use CPAP every night  Together  And with all the help that you can get, you WILL improve

## 2016-06-18 NOTE — Progress Notes (Signed)
Kathryn Beck     MRN: [id number]      DOB: 19-Dec-1955   HPI Kathryn Beck in with 4 month h/o excess fatigue, states unable to walk even a short distance without feeling very tired, and unable to do housework, just wants to sit around all the time. She is accompanied by her 2 children. States blood sugar remains too high, and she is unfortunately not testing as she should, children say she is "hard headed" however, she clearly needs help to cope wit this illness which she has been unable to get ahead of since she has been diabetic Though  She none seen on exam, and appears as though cottoas a CPAP machine she has not been using it as regularly as she should , and I explain the connection between her fatigue and untreated sleep apnea and encourage her to commit to regular use  Has multiple cardiac risk factors, and though she has had an echocardiogram in the past year, with her current complaint , I do feel that further evaluation is warranted and will refer her back c/o right ear pain x 2 weeks, states a  Tube was placed in the past , no clear recall of tube falling out.  ROS  See HPI  Denies recent fever or chills. Denies sinus pressure, nasal congestion, or sore throat. Denies chest congestion, productive cough or wheezing. Denies PND, orthopnea, palpitations and leg swelling Denies abdominal pain, nausea, vomiting,diarrhea or constipation.   Denies dysuria, frequency, hesitancy or incontinence. Denies joint pain, swelling and limitation in mobility. Denies headaches, seizures,   PE BP (!) 168/90   Pulse 84   Resp 14   Ht 5\' 5"  (1.651 m)   Wt 214 lb (97.1 kg)   BMI 35.61 kg/m   Patient alert and oriented and in no cardiopulmonary distress.  HEENT: No facial asymmetry, EOMI,   oropharynx pink and moist.  Neck supple no JVD, no mass. right TM dull, no tube visible, cotton seen in ear canal Chest: Clear to auscultation bilaterally.  CVS: S1, S2 no murmurs, no S3.Regular  rate.  ABD: Soft non tender.   Ext: No edema  MS: Adequate ROM spine, shoulders, hips and knees.  Skin: Intact, no ulcerations or rash noted.  Psych: Good eye contact, normal affect. Memory intact not anxious , slightly tearful and  depressed appearing.  CNS: CN 2-12 intact, power,  normal throughout.no focal deficits noted.   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 Kathryn Beck is reminded of the importance of commitment to daily physical activity for 30 minutes or more, as able and the need to limit carbohydrate intake to 30 to 60 grams per meal to help with blood sugar control.   The need to take medication as prescribed, test blood sugar as directed, and to call between visits if there is a concern that blood sugar is uncontrolled  is also discussed.   Kathryn Beck is reminded of the importance of daily foot exam, annual eye examination, and good blood sugar, blood pressure and cholesterol control.  Diabetic Labs Latest Ref Rng & Units 06/09/2016 05/19/2016 02/28/2016 01/23/2016 01/14/2016  HbA1c <5.7 % 11.1(H) - 11.2(H) - 10.0(H)  Microalbumin Not estab mg/dL - - 75.9 - -  Micro/Creat Ratio <30 mcg/mg creat - - 566(H) - -  Chol <200 mg/dL - 249(H) 324(H) - 308(H)  HDL >50 mg/dL - 45(L) 53 - 47  Calc LDL <100 mg/dL - NOT CALC NOT CALC - NOT CALC  Triglycerides <150 mg/dL - 674(H) 827(H) - 591(H)  Creatinine  0.50 - 0.99 mg/dL 2.43(H) - 1.91(H) 2.51(H) 1.94(H)   BP/Weight 06/18/2016 06/17/2016 06/04/2016 03/02/2016 03/02/2016 02/04/2016 15/03/7614  Systolic BP 073 710 - 626 - 948 546  Diastolic BP 90 98 - 81 - 68 80  Wt. (Lbs) 214 212 213 210 210 209.6 209  BMI 35.61 35.28 36.56 34.95 34.95 34.88 34.78   Foot/eye exam completion dates Latest Ref Rng & Units 01/13/2016 03/12/2015  Eye Exam No Retinopathy No Retinopathy -  Foot Form Completion - - Done        Mixed hyperlipidemia Hyperlipidemia:Low fat diet discussed and encouraged.   Lipid Panel  Lab Results  Component Value Date   CHOL 249 (H) 05/19/2016   HDL 45 (L) 05/19/2016   LDLCALC NOT CALC 05/19/2016   LDLDIRECT 107 05/19/2016   TRIG 674 (H) 05/19/2016   CHOLHDL 5.5 (H) 05/19/2016  not at goal wit marked elevated TG, add low dose crestor     Otalgia, right 3 week h/o pain, h/o tube placement, none seen on exam, refer to ENT for re eval  Essential hypertension Uncontrolled, I add labetalol DASH diet and commitment to daily physical activity for a minimum of 30 minutes discussed and encouraged, as a part of hypertension management. The importance of attaining a healthy weight is also discussed.  BP/Weight 06/18/2016 06/17/2016 06/04/2016 03/02/2016 03/02/2016 02/04/2016 27/0/3500  Systolic BP 938 182 - 993 - 716 967  Diastolic BP 90 98 - 81 - 68 80  Wt. (Lbs) 214 212 213 210 210 209.6 209  BMI 35.61 35.28 36.56 34.95 34.95 34.88 34.78   f/u in 6 weeks

## 2016-06-20 DIAGNOSIS — H9201 Otalgia, right ear: Secondary | ICD-10-CM | POA: Insufficient documentation

## 2016-06-20 NOTE — Assessment & Plan Note (Signed)
3 week h/o pain, h/o tube placement, none seen on exam, refer to ENT for re eval

## 2016-06-20 NOTE — Assessment & Plan Note (Signed)
Reports non compliance with CPAP , uses it approx 50% of the time and has c/o worsened fatigue, I explained for approx 7 mins the importance of regular use to protect her heart , lungs and brain

## 2016-06-20 NOTE — Assessment & Plan Note (Signed)
Four month h/o worsened fatigue with extremely poor exercise tolerance in  Pt with multiple CV risk factors, needs re eval by cardiology asap

## 2016-06-20 NOTE — Assessment & Plan Note (Signed)
Deteriorated despite endo , needs to take a more active role in her disease and she needs the support of her children who accompany her, I explained this at the visit. Kathryn Beck is reminded of the importance of commitment to daily physical activity for 30 minutes or more, as able and the need to limit carbohydrate intake to 30 to 60 grams per meal to help with blood sugar control.   The need to take medication as prescribed, test blood sugar as directed, and to call between visits if there is a concern that blood sugar is uncontrolled is also discussed.   Kathryn Beck is reminded of the importance of daily foot exam, annual eye examination, and good blood sugar, blood pressure and cholesterol control.  Diabetic Labs Latest Ref Rng & Units 06/09/2016 05/19/2016 02/28/2016 01/23/2016 01/14/2016  HbA1c <5.7 % 11.1(H) - 11.2(H) - 10.0(H)  Microalbumin Not estab mg/dL - - 75.9 - -  Micro/Creat Ratio <30 mcg/mg creat - - 566(H) - -  Chol <200 mg/dL - 249(H) 324(H) - 308(H)  HDL >50 mg/dL - 45(L) 53 - 47  Calc LDL <100 mg/dL - NOT CALC NOT CALC - NOT CALC  Triglycerides <150 mg/dL - 674(H) 827(H) - 591(H)  Creatinine 0.50 - 0.99 mg/dL 2.43(H) - 1.91(H) 2.51(H) 1.94(H)   BP/Weight 06/18/2016 06/17/2016 06/04/2016 03/02/2016 03/02/2016 02/04/2016 56/09/139  Systolic BP 030 131 - 438 - 887 579  Diastolic BP 90 98 - 81 - 68 80  Wt. (Lbs) 214 212 213 210 210 209.6 209  BMI 35.61 35.28 36.56 34.95 34.95 34.88 34.78   Foot/eye exam completion dates Latest Ref Rng & Units 01/13/2016 03/12/2015  Eye Exam No Retinopathy No Retinopathy -  Foot Form Completion - - Done

## 2016-06-20 NOTE — Assessment & Plan Note (Signed)
Hyperlipidemia:Low fat diet discussed and encouraged.   Lipid Panel  Lab Results  Component Value Date   CHOL 249 (H) 05/19/2016   HDL 45 (L) 05/19/2016   LDLCALC NOT CALC 05/19/2016   LDLDIRECT 107 05/19/2016   TRIG 674 (H) 05/19/2016   CHOLHDL 5.5 (H) 05/19/2016  not at goal wit marked elevated TG, add low dose crestor

## 2016-06-20 NOTE — Assessment & Plan Note (Signed)
Uncontrolled, I add labetalol DASH diet and commitment to daily physical activity for a minimum of 30 minutes discussed and encouraged, as a part of hypertension management. The importance of attaining a healthy weight is also discussed.  BP/Weight 06/18/2016 06/17/2016 06/04/2016 03/02/2016 03/02/2016 02/04/2016 62/04/2977  Systolic BP 892 119 - 417 - 408 144  Diastolic BP 90 98 - 81 - 68 80  Wt. (Lbs) 214 212 213 210 210 209.6 209  BMI 35.61 35.28 36.56 34.95 34.95 34.88 34.78   f/u in 6 weeks

## 2016-06-24 NOTE — Progress Notes (Signed)
Cardiology Office Note   Date:  06/25/2016   ID:  Kathryn Beck, DOB March 18, 1956, MRN [id number]  PCP:  Kathryn Nakayama, MD  Cardiologist:   Kathryn Beck   No chief complaint on file.     History of Present Illness: Kathryn Beck is a 61 y.o. female who presents for f/u  of LE edema. History of HTN, elevated lipids , poorly controlled DM , Obesity and CRF.  Recent baseline Cr worse at 2.5 LDL 145.  Compliance issues with meds.  Reviewed notes from Dr Moshe Cipro 01/14/16 and she was concerned that progressive leg swelling may indicate heart failure as opposed to volume overload from renal failure that is more likely  Seen by Dr Skeet Latch in 2015 for chest pain and dyspnea W/u benign and felt risk factor modification all that is needed  Echo reviewed from 01/24/14 EF 65-70% grade one Diastolic dysfunction no significant valve disease septal thickness only 9 mm   Myovue ordered normal with no ischemia EF 60% History of carotid bruit and duplex 05/09/15 plaque no stenosis   She has had more edema since July Dependant no history of DVT no PND orthopnea  Poor diet with carbs and salt   Echo updated EF 65-70% grade one diastolic no significant valve dx  She has not had recent stress test DM not well controlled with A1c over 11   Past Medical History:  Diagnosis Date  . Anemia   . Axillary masses    Soft tissue - status post excision  . Back pain   . Essential hypertension   . Mixed hyperlipidemia   . Obesity   . Sleep apnea    Noncompliant with CPAP  . Type 2 diabetes mellitus, uncontrolled ([place])     Past Surgical History:  Procedure Laterality Date  . ABDOMINAL HYSTERECTOMY    . COLONOSCOPY  2008   Dr. Oneida Alar: normal   . ESOPHAGEAL DILATION N/A 10/13/2015   Procedure: ESOPHAGEAL DILATION;  Surgeon: Rogene Houston, MD;  [facility];  Service: Endoscopy;  Laterality: N/A;  . ESOPHAGOGASTRODUODENOSCOPY N/A 10/13/2015   Dr. Laural Golden: chronic gastritis on path, no  H.pylori. Empiric dilation   . FOOT SURGERY Bilateral   . LUNG BIOPSY    . MASS EXCISION Right 01/09/2013   Procedure: EXCISION OF NEOPLASM OF RIGHT  AXILLA  AND EXCISION OF NEOPLASM OF LEFT AXILLA;  Surgeon: Jamesetta So, MD;  Location: [facility];  Service: General;  Laterality: Right;  procedure end @ 08:23     Current Outpatient Prescriptions  Medication Sig Dispense Refill  . ACCU-CHEK AVIVA PLUS test strip USE 4 TIMES DAILY 300 each 2  . ACCU-CHEK FASTCLIX LANCETS MISC USE AS INSTRUCTED THREE  TIMES DAILY 306 each 5  . allopurinol (ZYLOPRIM) 300 MG tablet TAKE 1 TABLET (300 MG TOTAL) BY MOUTH DAILY. 30 tablet 3  . amLODipine (NORVASC) 10 MG tablet TAKE 1 TABLET BY MOUTH  DAILY 90 tablet 1  . azelastine (ASTELIN) 0.1 % nasal spray     . azelastine (ASTELIN) 0.1 % nasal spray USE 2 SPRAYS INTO BOTH  NOSTRILS 2 (TWO) TIMES  DAILY AS DIRECTED 60 mL 3  . B-D UF III MINI PEN NEEDLES 31G X 5 MM MISC USE AS INSTRUCTED THREE  TIMES DAILY 200 each 0  . cloNIDine (CATAPRES) 0.1 MG tablet TAKE 1 TABLET BY MOUTH  EVERY EVENING AT 9PM FOR  BLOOD PRESSURE 90 tablet 1  . cyclobenzaprine (FLEXERIL) 10 MG tablet  Take 1 tablet (10 mg total) by mouth at bedtime. 30 tablet 3  . dicyclomine (BENTYL) 10 MG capsule Take 1 capsule (10 mg total) by mouth 4 (four) times daily -  before meals and at bedtime. 120 capsule 3  . ezetimibe (ZETIA) 10 MG tablet Take 1 tablet (10 mg total) by mouth daily. 30 tablet 5  . furosemide (LASIX) 40 MG tablet Take 1 tablet (40 mg total) by mouth 2 (two) times daily. 60 tablet 0  . HUMALOG KWIKPEN 100 UNIT/ML KiwkPen INJECT SUBCUTANEOUSLY UP TO 24 UNITS 3 TIMES DAILY  BEFORE MEALS 75 mL 0  . Insulin Detemir (LEVEMIR FLEXTOUCH) 100 UNIT/ML Pen Inject 100 Units into the skin at bedtime. 30 pen 2  . labetalol (NORMODYNE) 200 MG tablet Take 1 tablet (200 mg total) by mouth 2 (two) times daily. 60 tablet 3  . montelukast (SINGULAIR) 10 MG tablet Take 10 mg by mouth at bedtime.    .  pravastatin (PRAVACHOL) 20 MG tablet Take 1 tablet (20 mg total) by mouth daily. 30 tablet 3  . rosuvastatin (CRESTOR) 5 MG tablet Take 1 tablet (5 mg total) by mouth daily. 30 tablet 3  . spironolactone (ALDACTONE) 25 MG tablet TAKE 1 TABLET BY MOUTH TWO  TIMES DAILY 180 tablet 1  . triamterene-hydrochlorothiazide (MAXZIDE) 75-50 MG tablet TAKE 1 TABLET BY MOUTH  DAILY 90 tablet 1  . valsartan (DIOVAN) 320 MG tablet Take 320 mg by mouth daily.    . Vitamin D, Ergocalciferol, (DRISDOL) 50000 units CAPS capsule every 7 (seven) days.      No current facility-administered medications for this visit.     Allergies:   Ace inhibitors; Penicillins; and Statins    Social History:  The patient  reports that she has never smoked. She has never used smokeless tobacco. She reports that she does not drink alcohol or use drugs.   Family History:  The patient's family history includes Hypertension in her father.    ROS:  Please see the history of present illness.   Otherwise, review of systems are positive for none.   All other systems are reviewed and negative.    PHYSICAL EXAM: VS:  BP (!) 144/82 (BP Location: Left Arm)   Pulse 96   Ht 5\' 4"  (1.626 m)   Wt 212 lb (96.2 kg)   SpO2 97%   BMI 36.39 kg/m  , BMI Body mass index is 36.39 kg/m. Affect appropriate Obese black female  HEENT: normal Neck supple with no adenopathy JVP normal no bruits no thyromegaly Lungs clear with no wheezing and good diaphragmatic motion Heart:  S1/S2 no murmur, no rub, gallop or click PMI normal Abdomen: benighn, BS positve, no tenderness, no AAA no bruit.  No HSM or HJR Distal pulses intact with no bruits Plus one LE edema improved  Neuro non-focal Skin warm and dry No muscular weakness    EKG:  01/18/14 NSR rate 78 normal low precordial voltage 01/29/16  SR rate 100 low lateral voltage From body habitus    Recent Labs: 10/12/2015: Hemoglobin 11.4; Magnesium 1.9; Platelets 358 06/09/2016: ALT 24; BUN  47; Creat 2.43; Potassium 5.9; Sodium 134    Lipid Panel    Component Value Date/Time   CHOL 249 (H) 05/19/2016 1344   TRIG 674 (H) 05/19/2016 1344   HDL 45 (L) 05/19/2016 1344   CHOLHDL 5.5 (H) 05/19/2016 1344   VLDL NOT CALC 05/19/2016 1344   LDLCALC NOT CALC 05/19/2016 1344   LDLDIRECT 107 05/19/2016  1344      Wt Readings from Last 3 Encounters:  06/25/16 212 lb (96.2 kg)  06/18/16 214 lb (97.1 kg)  06/17/16 212 lb (96.2 kg)      Other studies Reviewed: Additional studies/ records that were reviewed today include: Notes Dr Moshe Cipro labs ECG and old myovue echo .    ASSESSMENT AND PLAN:  1.  Edema:dependant related to edema obesity and CRF Continue diuretic low sodium diet 2. HTN:  Well controlled.  Continue current medications and low sodium Dash type diet.   3. CRF  Concerning f/u nephrology she will be on dialysis if she doesn't take better care of herself 4. DM  Discussed low carb diet.  Target hemoglobin A1c is 6.5 or less.  Continue current medications. 5. Chol  Continue statin  6. CAD: high risk given poorly controlled DM f/u lexiscan myovue  7. Fatigue related to polypharmacy and poorly controlled DM f/u Dr Moshe Cipro     Current medicines are reviewed at length with the patient today.  The patient does not have concerns regarding medicines.  The following changes have been made:  no change  Labs/ tests ordered today include:  Myovue   Orders Placed This Encounter  Procedures  . NM Myocar Multi W/Spect W/Wall Motion / EF     Disposition:   FU with [place] PRN      Signed, Jenkins Rouge, MD  06/25/2016 10:55 AM    [facility] [place], [place], [place]  [address] Phone: [phone]; Fax: [phone]

## 2016-06-25 ENCOUNTER — Ambulatory Visit (INDEPENDENT_AMBULATORY_CARE_PROVIDER_SITE_OTHER): Payer: 59 | Admitting: Cardiovascular Disease

## 2016-06-25 ENCOUNTER — Encounter: Payer: Self-pay | Admitting: Cardiovascular Disease

## 2016-06-25 VITALS — BP 144/82 | HR 96 | Ht 64.0 in | Wt 212.0 lb

## 2016-06-25 DIAGNOSIS — R0600 Dyspnea, unspecified: Secondary | ICD-10-CM | POA: Diagnosis not present

## 2016-06-25 NOTE — Patient Instructions (Signed)
Your physician wants you to follow-up in: 1 year Dr Blima Singer will receive a reminder letter in the mail two months in advance. If you don't receive a letter, please call our office to schedule the follow-up appointment.    Your physician recommends that you continue on your current medications as directed. Please refer to the Current Medication list given to you today.    Your physician has requested that you have a lexiscan myoview. For further information please visit [email or web address]. Please follow instruction sheet, as given.      Thank you for choosing [facility] !

## 2016-07-02 ENCOUNTER — Ambulatory Visit: Payer: 59 | Admitting: Family Medicine

## 2016-07-06 ENCOUNTER — Inpatient Hospital Stay (HOSPITAL_COMMUNITY): Admission: RE | Admit: 2016-07-06 | Payer: 59 | Source: Ambulatory Visit

## 2016-07-06 ENCOUNTER — Encounter (HOSPITAL_COMMUNITY)
Admission: RE | Admit: 2016-07-06 | Discharge: 2016-07-06 | Disposition: A | Discharge status: Home / Self Care | Payer: 59 | Source: Ambulatory Visit | Attending: Cardiovascular Disease | Admitting: Cardiovascular Disease

## 2016-07-06 ENCOUNTER — Encounter (HOSPITAL_COMMUNITY): Payer: Self-pay

## 2016-07-06 DIAGNOSIS — R0600 Dyspnea, unspecified: Secondary | ICD-10-CM | POA: Diagnosis not present

## 2016-07-06 LAB — NM MYOCAR MULTI W/SPECT W/WALL MOTION / EF
CHL CUP NUCLEAR SDS: 6
LHR: 0.52
LVDIAVOL: 75 mL (ref 46–106)
LVSYSVOL: 31 mL
NUC STRESS TID: 1.09
Peak HR: 109 {beats}/min
Rest HR: 72 {beats}/min
SRS: 4
SSS: 10

## 2016-07-06 MED ORDER — SODIUM CHLORIDE 0.9% FLUSH
INTRAVENOUS | Status: AC
  Administered 2016-07-06: 10 mL via INTRAVENOUS
  Filled 2016-07-06: qty 10

## 2016-07-06 MED ORDER — REGADENOSON 0.4 MG/5ML IV SOLN
INTRAVENOUS | Status: AC
  Administered 2016-07-06: 0.4 mg via INTRAVENOUS
  Filled 2016-07-06: qty 5

## 2016-07-06 MED ORDER — TECHNETIUM TC 99M TETROFOSMIN IV KIT
30.0000 | PACK | Freq: Once | INTRAVENOUS | Status: AC | PRN
  Administered 2016-07-06: 30.2 via INTRAVENOUS

## 2016-07-06 MED ORDER — TECHNETIUM TC 99M TETROFOSMIN IV KIT
10.0000 | PACK | Freq: Once | INTRAVENOUS | Status: AC | PRN
  Administered 2016-07-06: 10 via INTRAVENOUS

## 2016-07-08 NOTE — Progress Notes (Signed)
Cardiology Office Note  Date: 07/09/2016   ID: Kathryn Beck, DOB March 19, 1956, MRN [id number]  PCP: Tula Nakayama, MD  Primary Cardiologist: Rozann Lesches, MD   Chief Complaint  Patient presents with  . Follow-up testing    History of Present Illness: Kathryn Beck is a 61 y.o. female that I have not seen since November 2015. She has had follow-up with Dr. Johnsie Cancel most recently including November 2017 and just recently in April. I reviewed his recent note. He arranged a follow-up Lexiscan Myoview for ischemic evaluation. Additional concerns included dependent edema and obesity, also stage 3-4 CKD with creatinine 2.4 most recently. Stress test was abnormal as detailed below, and she was scheduled to come in and see me to discuss the results.  She is here today with her daughter who is an Corporate treasurer and works at [employer]. [name] does not report any chest pain but has dyspnea on exertion, also leg swelling. She states that she follows with Dr. Lowanda Foster for her renal disease on a regular basis.  I reviewed her medications. She is on multimodal antihypertensive therapy which includes diuretics and also ARB. Due to degree of renal insufficiency I did discuss with her risks for contrast nephropathy were we to consider a cardiac catheterization at this time. We will attempt medication adjustments first.   Past Medical History:  Diagnosis Date  . Anemia   . Axillary masses    Soft tissue - status post excision  . Back pain   . Essential hypertension   . Mixed hyperlipidemia   . Obesity   . Sleep apnea    Noncompliant with CPAP  . Type 2 diabetes mellitus, uncontrolled ([place])     Past Surgical History:  Procedure Laterality Date  . ABDOMINAL HYSTERECTOMY    . COLONOSCOPY  2008   Dr. Oneida Alar: normal   . ESOPHAGEAL DILATION N/A 10/13/2015   Procedure: ESOPHAGEAL DILATION;  Surgeon: Rogene Houston, MD;  [facility];  Service: Endoscopy;  Laterality: N/A;  .  ESOPHAGOGASTRODUODENOSCOPY N/A 10/13/2015   Dr. Laural Golden: chronic gastritis on path, no H.pylori. Empiric dilation   . FOOT SURGERY Bilateral   . LUNG BIOPSY    . MASS EXCISION Right 01/09/2013   Procedure: EXCISION OF NEOPLASM OF RIGHT  AXILLA  AND EXCISION OF NEOPLASM OF LEFT AXILLA;  Surgeon: Jamesetta So, MD;  Location: [facility];  Service: General;  Laterality: Right;  procedure end @ 08:23    Current Outpatient Prescriptions  Medication Sig Dispense Refill  . ACCU-CHEK AVIVA PLUS test strip USE 4 TIMES DAILY 300 each 2  . ACCU-CHEK FASTCLIX LANCETS MISC USE AS INSTRUCTED THREE  TIMES DAILY 306 each 5  . allopurinol (ZYLOPRIM) 300 MG tablet TAKE 1 TABLET (300 MG TOTAL) BY MOUTH DAILY. 30 tablet 3  . amLODipine (NORVASC) 10 MG tablet TAKE 1 TABLET BY MOUTH  DAILY 90 tablet 1  . azelastine (ASTELIN) 0.1 % nasal spray USE 2 SPRAYS INTO BOTH  NOSTRILS 2 (TWO) TIMES  DAILY AS DIRECTED 60 mL 3  . B-D UF III MINI PEN NEEDLES 31G X 5 MM MISC USE AS INSTRUCTED THREE  TIMES DAILY 200 each 0  . cloNIDine (CATAPRES) 0.1 MG tablet TAKE 1 TABLET BY MOUTH  EVERY EVENING AT 9PM FOR  BLOOD PRESSURE 90 tablet 1  . cyclobenzaprine (FLEXERIL) 10 MG tablet Take 1 tablet (10 mg total) by mouth at bedtime. 30 tablet 3  . dicyclomine (BENTYL) 10 MG capsule Take 1  capsule (10 mg total) by mouth 4 (four) times daily -  before meals and at bedtime. 120 capsule 3  . ezetimibe (ZETIA) 10 MG tablet Take 1 tablet (10 mg total) by mouth daily. 30 tablet 5  . furosemide (LASIX) 40 MG tablet Take 1 tablet (40 mg total) by mouth 2 (two) times daily. 60 tablet 0  . HUMALOG KWIKPEN 100 UNIT/ML KiwkPen INJECT SUBCUTANEOUSLY UP TO 24 UNITS 3 TIMES DAILY  BEFORE MEALS 75 mL 0  . Insulin Detemir (LEVEMIR FLEXTOUCH) 100 UNIT/ML Pen Inject 100 Units into the skin at bedtime. 30 pen 2  . montelukast (SINGULAIR) 10 MG tablet Take 10 mg by mouth at bedtime.    . rosuvastatin (CRESTOR) 5 MG tablet Take 1 tablet (5 mg total) by mouth  daily. 30 tablet 3  . spironolactone (ALDACTONE) 25 MG tablet TAKE 1 TABLET BY MOUTH TWO  TIMES DAILY 180 tablet 1  . triamterene-hydrochlorothiazide (MAXZIDE) 75-50 MG tablet TAKE 1 TABLET BY MOUTH  DAILY 90 tablet 1  . valsartan (DIOVAN) 320 MG tablet Take 320 mg by mouth daily.    . Vitamin D, Ergocalciferol, (DRISDOL) 50000 units CAPS capsule every 7 (seven) days.     Marland Kitchen labetalol (NORMODYNE) 200 MG tablet Take 2 tablets (400 mg total) by mouth 2 (two) times daily. 360 tablet 3   No current facility-administered medications for this visit.    Allergies:  Ace inhibitors; Penicillins; and Statins   Social History: The patient  reports that she has never smoked. She has never used smokeless tobacco. She reports that she does not drink alcohol or use drugs.   ROS:  Please see the history of present illness. Otherwise, complete review of systems is positive for intermittent leg swelling.  All other systems are reviewed and negative.   Physical Exam: VS:  BP (!) 188/98   Pulse (!) 101   Ht 5\' 4"  (1.626 m)   Wt 226 lb (102.5 kg)   SpO2 97%   BMI 38.79 kg/m , BMI Body mass index is 38.79 kg/m.  Wt Readings from Last 3 Encounters:  07/09/16 226 lb (102.5 kg)  06/25/16 212 lb (96.2 kg)  06/18/16 214 lb (97.1 kg)    Obese woman, appears comfortable at rest. HEENT: Conjunctiva and lids normal, oropharynx clear. Neck: Supple, no elevated JVP or carotid bruits, no thyromegaly. Lungs: Clear to auscultation, nonlabored breathing at rest. Cardiac: Regular rate and rhythm, no S3, 2/6 systolic murmur at base, no pericardial rub. Abdomen: Soft, nontender, bowel sounds present, no guarding or rebound. Extremities: Mild leg edema, distal pulses 2+. Skin: Warm and dry. Musculoskeletal: No kyphosis. Neuropsychiatric: Alert and oriented 3, affect appropriate.  ECG: I personally reviewed the tracing from 01/29/2016 which showed sinus rhythm with poor R-wave progression.  Recent  Labwork: 10/12/2015: Hemoglobin 11.4; Magnesium 1.9; Platelets 358 06/09/2016: ALT 24; AST 19; BUN 47; Creat 2.43; Potassium 5.9; Sodium 134     Component Value Date/Time   CHOL 249 (H) 05/19/2016 1344   TRIG 674 (H) 05/19/2016 1344   HDL 45 (L) 05/19/2016 1344   CHOLHDL 5.5 (H) 05/19/2016 1344   VLDL NOT CALC 05/19/2016 1344   LDLCALC NOT CALC 05/19/2016 1344   LDLDIRECT 107 05/19/2016 1344    Other Studies Reviewed Today:  Lexiscan Myoview 07/06/2016:  There was no ST segment deviation noted during stress.  Findings consistent with prior moderate anterior myocardial infarction with moderate peri-infarct ischemia. SSS 10 SRS 4 SDS 6  The left ventricular ejection fraction  is normal (55-65%).  This is an intermediate risk study.  Echocardiogram 03/04/2016: Study Conclusions  - Left ventricle: The cavity size was normal. Wall thickness was   increased in a pattern of moderate LVH. Systolic function was   vigorous. The estimated ejection fraction was in the range of 65%   to 70%. Wall motion was normal; there were no regional wall   motion abnormalities. Doppler parameters are consistent with   abnormal left ventricular relaxation (grade 1 diastolic   dysfunction). LA pressure is indeterminant. - Aortic valve: Valve area (VTI): 2.39 cm^2. Valve area (Vmax):   2.83 cm^2. - Technically adequate study.  Assessment and Plan:  1. Dyspnea on exertion, likely multifactorial. She has uncontrolled hypertension, obesity, OSA without CPAP use, diabetes mellitus, chronic kidney disease, and also evidence of ischemic heart disease based on recent Myoview. Her risk for contrast nephropathy with cardiac catheterization is significant, we discussed this today. Plan will be adjustment in medical therapy first. Increase labetalol to 400 mg twice daily. Might add Imdur and possibly hydralazine with discontinuation of some of her other baseline therapies presuming this is okay with Dr. Lowanda Foster.  Office follow-up arranged.  2. CKD, stage 3-4. Last creatinine 2.4. She follows with Dr. Lowanda Foster.  3. Hyperlipidemia. She is on Crestor with recent addition of Zetia per Dr. Moshe Cipro. Last LDL 107. She also has significant hypertriglyceridemia in the setting of diabetes mellitus.  4. Uncontrolled hypertension on multimodal therapy. She is following with Dr. Moshe Cipro and also Dr. Lowanda Foster. Control is complicated by chronic kidney disease. Continue to work on medication adjustments.  Current medicines were reviewed with the patient today.  Disposition: Follow up in 4-6 weeks.  Signed, Satira Sark, MD, [facility] 07/09/2016 9:06 AM    [place] [facility] [address] [place], [place] [address] Phone: [phone]; Fax: [phone]

## 2016-07-09 ENCOUNTER — Ambulatory Visit (INDEPENDENT_AMBULATORY_CARE_PROVIDER_SITE_OTHER): Payer: 59 | Admitting: Cardiology

## 2016-07-09 ENCOUNTER — Other Ambulatory Visit: Payer: Self-pay | Admitting: Family Medicine

## 2016-07-09 ENCOUNTER — Encounter: Payer: Self-pay | Admitting: Cardiology

## 2016-07-09 VITALS — BP 188/98 | HR 101 | Ht 64.0 in | Wt 226.0 lb

## 2016-07-09 DIAGNOSIS — N183 Chronic kidney disease, stage 3 unspecified: Secondary | ICD-10-CM

## 2016-07-09 DIAGNOSIS — R0609 Other forms of dyspnea: Secondary | ICD-10-CM

## 2016-07-09 DIAGNOSIS — I10 Essential (primary) hypertension: Secondary | ICD-10-CM | POA: Diagnosis not present

## 2016-07-09 DIAGNOSIS — R9439 Abnormal result of other cardiovascular function study: Secondary | ICD-10-CM

## 2016-07-09 DIAGNOSIS — E782 Mixed hyperlipidemia: Secondary | ICD-10-CM

## 2016-07-09 MED ORDER — LABETALOL HCL 200 MG PO TABS: 400.0000 mg | ORAL_TABLET | Freq: Two times a day (BID) | ORAL | 3 refills | Status: DC

## 2016-07-09 NOTE — Patient Instructions (Addendum)
Your physician recommends that you schedule a follow-up appointment in: 4-6 weeks    INCREASE Labetalol to 400 mg twice a day- take two (200 mg) tablets twice a day    STOP Pravastatin, continue Crestor and Zetia     Thank you for choosing [facility] !

## 2016-07-13 ENCOUNTER — Ambulatory Visit (INDEPENDENT_AMBULATORY_CARE_PROVIDER_SITE_OTHER): Payer: 59 | Admitting: Otolaryngology

## 2016-07-13 DIAGNOSIS — H6983 Other specified disorders of Eustachian tube, bilateral: Secondary | ICD-10-CM

## 2016-07-13 DIAGNOSIS — H6121 Impacted cerumen, right ear: Secondary | ICD-10-CM

## 2016-07-27 ENCOUNTER — Telehealth: Payer: Self-pay | Admitting: Family Medicine

## 2016-07-27 ENCOUNTER — Other Ambulatory Visit: Payer: Self-pay

## 2016-07-27 ENCOUNTER — Ambulatory Visit (INDEPENDENT_AMBULATORY_CARE_PROVIDER_SITE_OTHER): Payer: 59 | Admitting: Otolaryngology

## 2016-07-27 DIAGNOSIS — I10 Essential (primary) hypertension: Secondary | ICD-10-CM

## 2016-07-27 MED ORDER — VITAMIN D (ERGOCALCIFEROL) 1.25 MG (50000 UNIT) PO CAPS: 50000.0000 [IU] | ORAL_CAPSULE | ORAL | 0 refills | Status: DC

## 2016-07-27 MED ORDER — VALSARTAN 320 MG PO TABS: 320.0000 mg | ORAL_TABLET | Freq: Every day | ORAL | 0 refills | Status: DC

## 2016-07-27 MED ORDER — FUROSEMIDE 40 MG PO TABS: 40.0000 mg | ORAL_TABLET | Freq: Two times a day (BID) | ORAL | 0 refills | Status: DC

## 2016-07-27 NOTE — Telephone Encounter (Signed)
meds sent

## 2016-07-27 NOTE — Telephone Encounter (Signed)
Patient normally uses [facility] mail order pharmacy but is complete out of the following and is asking for 30 day supply for Valsartan 320mg  , Vitamin D-2 (50000 iu), and Furosemide 40mg .  Please call in @ [facility].  Please call if there is any problem with this request.

## 2016-07-27 NOTE — Progress Notes (Signed)
meds sent

## 2016-08-04 ENCOUNTER — Ambulatory Visit: Payer: 59 | Admitting: Family Medicine

## 2016-08-10 ENCOUNTER — Telehealth: Payer: Self-pay

## 2016-08-10 NOTE — Telephone Encounter (Signed)
Spoke with daughter explained her mom missed appt next week, has appt next week with cardiology and I had even tried to call her after the fact to find out why and was unable to speak with ehr. Her next appt is with cardiology next week and with me on June 4, I gave her appt inf and encouraged her to sign up on my chart to keep up with her mom's appointments

## 2016-08-10 NOTE — Telephone Encounter (Signed)
Daughter called stating her mom has increased pedal edema.  cb # [phone]

## 2016-08-11 ENCOUNTER — Telehealth: Payer: Self-pay | Admitting: Family Medicine

## 2016-08-11 NOTE — Telephone Encounter (Signed)
I called pt this morning to f/u on leg swelling. She states she has been doubling up on the lasix intermittently as needed, with success as directed by cardiology, and that she has sufficient medication to last until her appointments. She also states that her blood sugar is better now that her insulin is up to 100 and she is eating better. She states she did not miss her last appt , got confused, went to office downstairs, my "old office" then came upstairs to window at 4:15 and was told she was too late for her 4pm appt even after she explained that she had become confused when she came to the building initially. She also stated that she knew that she had not even been called to the back as in we were not ready to see her since all of her registration info was being taken at the front when she was attempting to check in I explained that we do have a policy of late arrivals, however I will also ask the office manager to give her a call as she did  Explain to front desk that she initially was in the wrong office and arrived 5 mins later than "cut off" toime

## 2016-08-12 NOTE — Telephone Encounter (Signed)
I called pt and talked with her regarding the visit to our office last week.  I ask her to talk with me is she has any other issue.  I have talked with the staff at the front desk and advised they need to check with the Physician prior to sending a patient out that was late do to the issues Kathryn Beck discussed.

## 2016-08-12 NOTE — Telephone Encounter (Signed)
I have spoken with daughter and pt

## 2016-08-17 ENCOUNTER — Encounter: Payer: Self-pay | Admitting: Cardiology

## 2016-08-17 NOTE — Progress Notes (Signed)
Cardiology Office Note  Date: 08/18/2016   ID: Kathryn Beck, DOB 09-10-1955, MRN [id number]  PCP: Kathryn Helper, MD  Primary Cardiologist: Kathryn Lesches, MD   Chief Complaint  Patient presents with  . Cardiac follow-up    History of Present Illness: Kathryn Beck is a 61 y.o. female last seen in April 2018. She is here with her daughter for a follow-up visit. States that she is doing reasonably well, no chest pain. Has had some mild leg edema, improves when she puts her legs up. We discussed a trial of compression hose today. Would be reluctant to push her diuretics too much higher in light of chronic kidney disease.  She continues to follow with Dr. Lowanda Beck.  We discussed her current medications. She continues on clonidine, Norvasc, Zetia, Lasix, low-dose Crestor, Aldactone, and Diovan.  Past Medical History:  Diagnosis Date  . Anemia   . Axillary masses    Soft tissue - status post excision  . Back pain   . Essential hypertension   . Ischemic heart disease    Abnormal Myoview April 2018 - medical therapy  . Mixed hyperlipidemia   . Obesity   . Sleep apnea    Noncompliant with CPAP  . Type 2 diabetes mellitus, uncontrolled ([name])     Past Surgical History:  Procedure Laterality Date  . ABDOMINAL HYSTERECTOMY    . COLONOSCOPY  2008   Dr. Oneida Beck: normal   . ESOPHAGEAL DILATION N/A 10/13/2015   Procedure: ESOPHAGEAL DILATION;  Surgeon: Kathryn Houston, MD;  Location: [facility];  Service: Endoscopy;  Laterality: N/A;  . ESOPHAGOGASTRODUODENOSCOPY N/A 10/13/2015   Dr. Laural Beck: chronic gastritis on path, no H.pylori. Empiric dilation   . FOOT SURGERY Bilateral   . LUNG BIOPSY    . MASS EXCISION Right 01/09/2013   Procedure: EXCISION OF NEOPLASM OF RIGHT  AXILLA  AND EXCISION OF NEOPLASM OF LEFT AXILLA;  Surgeon: Kathryn So, MD;  Location: [facility]S;  Service: General;  Laterality: Right;  procedure end @ 08:23    Current Outpatient Prescriptions    Medication Sig Dispense Refill  . ACCU-CHEK AVIVA PLUS test strip USE 4 TIMES DAILY 300 each 2  . ACCU-CHEK FASTCLIX LANCETS MISC USE AS INSTRUCTED THREE  TIMES DAILY 306 each 5  . allopurinol (ZYLOPRIM) 300 MG tablet TAKE 1 TABLET (300 MG TOTAL) BY MOUTH DAILY. 30 tablet 3  . amLODipine (NORVASC) 10 MG tablet TAKE 1 TABLET BY MOUTH  DAILY 90 tablet 1  . azelastine (ASTELIN) 0.1 % nasal spray USE 2 SPRAYS INTO BOTH  NOSTRILS 2 (TWO) TIMES  DAILY AS DIRECTED 60 mL 3  . B-D UF III MINI PEN NEEDLES 31G X 5 MM MISC USE AS INSTRUCTED THREE  TIMES DAILY 200 each 0  . cloNIDine (CATAPRES) 0.1 MG tablet TAKE 1 TABLET BY MOUTH  EVERY EVENING AT 9PM FOR  BLOOD PRESSURE 90 tablet 1  . cyclobenzaprine (FLEXERIL) 10 MG tablet Take 1 tablet (10 mg total) by mouth at bedtime. 30 tablet 3  . dicyclomine (BENTYL) 10 MG capsule Take 1 capsule (10 mg total) by mouth 4 (four) times daily -  before meals and at bedtime. 120 capsule 3  . ezetimibe (ZETIA) 10 MG tablet Take 1 tablet (10 mg total) by mouth daily. 30 tablet 5  . furosemide (LASIX) 40 MG tablet Take 1 tablet (40 mg total) by mouth 2 (two) times daily. 60 tablet 0  . HUMALOG KWIKPEN 100 UNIT/ML KiwkPen  INJECT SUBCUTANEOUSLY UP TO 24 UNITS 3 TIMES DAILY  BEFORE MEALS 75 mL 0  . Insulin Detemir (LEVEMIR FLEXTOUCH) 100 UNIT/ML Pen Inject 100 Units into the skin at bedtime. 30 pen 2  . labetalol (NORMODYNE) 200 MG tablet Take 2 tablets (400 mg total) by mouth 2 (two) times daily. 360 tablet 3  . montelukast (SINGULAIR) 10 MG tablet Take 10 mg by mouth at bedtime.    . rosuvastatin (CRESTOR) 5 MG tablet Take 1 tablet (5 mg total) by mouth daily. 30 tablet 3  . spironolactone (ALDACTONE) 25 MG tablet TAKE 1 TABLET BY MOUTH TWO  TIMES DAILY 180 tablet 1  . triamterene-hydrochlorothiazide (MAXZIDE) 75-50 MG tablet TAKE 1 TABLET BY MOUTH  DAILY 90 tablet 1  . valsartan (DIOVAN) 320 MG tablet Take 1 tablet (320 mg total) by mouth daily. 30 tablet 0  . Vitamin D,  Ergocalciferol, (DRISDOL) 50000 units CAPS capsule Take 1 capsule (50,000 Units total) by mouth every 7 (seven) days. 4 capsule 0   No current facility-administered medications for this visit.    Allergies:  Ace inhibitors; Penicillins; and Statins   Social History: The patient  reports that she has never smoked. She has never used smokeless tobacco. She reports that she does not drink alcohol or use drugs.   ROS:  Please see the history of present illness. Otherwise, complete review of systems is positive for NYHA class II dyspnea on exertion.  All other systems are reviewed and negative.   Physical Exam: VS:  BP (!) 158/88   Pulse 94   Ht 5\' 4"  (1.626 m)   Wt 222 lb (100.7 kg)   SpO2 97%   BMI 38.11 kg/m , BMI Body mass index is 38.11 kg/m.  Wt Readings from Last 3 Encounters:  08/18/16 222 lb (100.7 kg)  07/09/16 226 lb (102.5 kg)  06/25/16 212 lb (96.2 kg)    Obese woman, appears comfortable at rest. HEENT: Conjunctiva and lids normal, oropharynx clear. Neck: Supple, no elevated JVP or carotid bruits, no thyromegaly. Lungs: Clear to auscultation, nonlabored breathing at rest. Cardiac: Regular rate and rhythm, no S3, 2/6 systolic murmur at base, no pericardial rub. Abdomen: Soft, nontender, bowel sounds present, no guarding or rebound. Extremities: 1-2+ leg edema, distal pulses 2+. Skin: Warm and dry. Musculoskeletal: No kyphosis. Neuropsychiatric: Alert and oriented 3, affect appropriate.  ECG: I personally reviewed the tracing from 01/29/2016 which showed sinus rhythm with poor R-wave progression.  Recent Labwork: 10/12/2015: Hemoglobin 11.4; Magnesium 1.9; Platelets 358 06/09/2016: ALT 24; AST 19; BUN 47; Creat 2.43; Potassium 5.9; Sodium 134     Component Value Date/Time   CHOL 249 (H) 05/19/2016 1344   TRIG 674 (H) 05/19/2016 1344   HDL 45 (L) 05/19/2016 1344   CHOLHDL 5.5 (H) 05/19/2016 1344   VLDL NOT CALC 05/19/2016 1344   LDLCALC NOT CALC 05/19/2016 1344    LDLDIRECT 107 05/19/2016 1344    Other Studies Reviewed Today:  Lexiscan Myoview 07/06/2016:  There was no ST segment deviation noted during stress.  Findings consistent with prior moderate anterior myocardial infarction with moderate peri-infarct ischemia. SSS 10 SRS 4 SDS 6  The left ventricular ejection fraction is normal (55-65%).  This is an intermediate risk study.  Echocardiogram 03/04/2016: Study Conclusions  - Left ventricle: The cavity size was normal. Wall thickness was increased in a pattern of moderate LVH. Systolic function was vigorous. The estimated ejection fraction was in the range of 65% to 70%. Wall motion was normal;  there were no regional wall motion abnormalities. Doppler parameters are consistent with abnormal left ventricular relaxation (grade 1 diastolic dysfunction). LA pressure is indeterminant. - Aortic valve: Valve area (VTI): 2.39 cm^2. Valve area (Vmax): 2.83 cm^2. - Technically adequate study.  Assessment and Plan:  1. Ischemic heart disease based on abnormal Myoview study as outlined above. Plan is to continue medical therapy at this point with increased risk of contrast nephropathy were we to pursue a cardiac catheterization. She does not report active angina. Could add Imdur next if needed.  2. 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 Lowanda Beck, creatinine 2.5 range.  3. Hyperlipidemia on low-dose Crestor and Zetia. Would also consider omega-3 supplements in light of hypertriglyceridemia. Keep follow-up with Dr. Moshe Cipro.  4. Essential hypertension. No changes made present regimen.  5. Intermittent leg edema. Prescription given for low-level compression stockings.  Current medicines were reviewed with the patient today.  Disposition: Follow-up in 6 months.  Signed, Satira Sark, MD, [facility] 08/18/2016 10:39 AM    [facility] [place]. [address], [place], [place]  [address] Phone: [phone]; Fax: [phone]

## 2016-08-18 ENCOUNTER — Encounter: Payer: Self-pay | Admitting: Cardiology

## 2016-08-18 ENCOUNTER — Ambulatory Visit (INDEPENDENT_AMBULATORY_CARE_PROVIDER_SITE_OTHER): Payer: 59 | Admitting: Cardiology

## 2016-08-18 VITALS — BP 158/88 | HR 94 | Ht 64.0 in | Wt 222.0 lb

## 2016-08-18 DIAGNOSIS — N183 Chronic kidney disease, stage 3 unspecified: Secondary | ICD-10-CM

## 2016-08-18 DIAGNOSIS — R600 Localized edema: Secondary | ICD-10-CM | POA: Diagnosis not present

## 2016-08-18 DIAGNOSIS — I10 Essential (primary) hypertension: Secondary | ICD-10-CM

## 2016-08-18 DIAGNOSIS — E782 Mixed hyperlipidemia: Secondary | ICD-10-CM

## 2016-08-18 DIAGNOSIS — I259 Chronic ischemic heart disease, unspecified: Secondary | ICD-10-CM | POA: Diagnosis not present

## 2016-08-18 NOTE — Patient Instructions (Signed)
Your physician wants you to follow-up in:  6 months Dr.McDowell You will receive a reminder letter in the mail two months in advance. If you don't receive a letter, please call our office to schedule the follow-up appointment.    Your physician recommends that you continue on your current medications as directed. Please refer to the Current Medication list given to you today.    If you need a refill on your cardiac medications before your next appointment, please call your pharmacy.     Please get compression stockings for leg swelling,I have given you a prescription for these       Thank you for choosing [facility] !

## 2016-08-22 ENCOUNTER — Other Ambulatory Visit: Payer: Self-pay | Admitting: Family Medicine

## 2016-08-24 ENCOUNTER — Encounter: Payer: Self-pay | Admitting: Family Medicine

## 2016-08-24 ENCOUNTER — Other Ambulatory Visit: Payer: Self-pay | Admitting: Family Medicine

## 2016-08-24 ENCOUNTER — Ambulatory Visit (INDEPENDENT_AMBULATORY_CARE_PROVIDER_SITE_OTHER): Payer: 59 | Admitting: Family Medicine

## 2016-08-24 VITALS — BP 150/70 | HR 98 | Resp 16 | Ht 64.0 in | Wt 226.0 lb

## 2016-08-24 DIAGNOSIS — M25512 Pain in left shoulder: Secondary | ICD-10-CM | POA: Diagnosis not present

## 2016-08-24 DIAGNOSIS — E1322 Other specified diabetes mellitus with diabetic chronic kidney disease: Secondary | ICD-10-CM | POA: Diagnosis not present

## 2016-08-24 DIAGNOSIS — IMO0002 Reserved for concepts with insufficient information to code with codable children: Secondary | ICD-10-CM

## 2016-08-24 DIAGNOSIS — E782 Mixed hyperlipidemia: Secondary | ICD-10-CM

## 2016-08-24 DIAGNOSIS — G8929 Other chronic pain: Secondary | ICD-10-CM | POA: Diagnosis not present

## 2016-08-24 DIAGNOSIS — N184 Chronic kidney disease, stage 4 (severe): Secondary | ICD-10-CM | POA: Diagnosis not present

## 2016-08-24 DIAGNOSIS — E1365 Other specified diabetes mellitus with hyperglycemia: Secondary | ICD-10-CM | POA: Diagnosis not present

## 2016-08-24 DIAGNOSIS — J3089 Other allergic rhinitis: Secondary | ICD-10-CM

## 2016-08-24 DIAGNOSIS — I10 Essential (primary) hypertension: Secondary | ICD-10-CM

## 2016-08-24 DIAGNOSIS — G4733 Obstructive sleep apnea (adult) (pediatric): Secondary | ICD-10-CM

## 2016-08-24 NOTE — Patient Instructions (Addendum)
Annual physical exam in October, call if you need me sooner  Fasting lipid, cmp and eGFr 1 week before visit    Please get x ray of left shoulder and schedule your mammogram today    Please schedule yourcolonoscopy I am referring you to your GI Doc  Please try to use CPAP every night this will help heart and lngs  Thank you  for choosing [facility]. We consider it a privelige to serve you.  Delivering excellent health care in a caring and  compassionate way is our goal.  Partnering with you,  so that together we can achieve this goal is our strategy.   Foot exam today is goood, toenails need cutting

## 2016-08-24 NOTE — Assessment & Plan Note (Addendum)
6 month history of pain and reduced mobility, likely from pathology in c spine, will x ray shoulder per pt request

## 2016-08-25 ENCOUNTER — Ambulatory Visit (HOSPITAL_COMMUNITY)
Admission: RE | Admit: 2016-08-25 | Discharge: 2016-08-25 | Disposition: A | Discharge status: Home / Self Care | Payer: 59 | Source: Ambulatory Visit | Attending: Family Medicine | Admitting: Family Medicine

## 2016-08-25 DIAGNOSIS — G8929 Other chronic pain: Secondary | ICD-10-CM

## 2016-08-25 DIAGNOSIS — M25512 Pain in left shoulder: Secondary | ICD-10-CM

## 2016-08-25 DIAGNOSIS — M19012 Primary osteoarthritis, left shoulder: Secondary | ICD-10-CM | POA: Insufficient documentation

## 2016-08-29 ENCOUNTER — Encounter: Payer: Self-pay | Admitting: Family Medicine

## 2016-08-29 NOTE — Assessment & Plan Note (Signed)
Uncontrolled and not at goal, will not icrease medication however DASH diet and commitment to daily physical activity for a minimum of 30 minutes discussed and encouraged, as a part of hypertension management. The importance of attaining a healthy weight is also discussed. Currently on multiple meds BP/Weight 08/24/2016 08/18/2016 07/09/2016 06/25/2016 06/18/2016 06/17/2016 4/88/3014  Systolic BP 159 733 125 087 199 412 -  Diastolic BP 70 88 98 82 90 98 -  Wt. (Lbs) 226 222 226 212 214 212 213  BMI 38.79 38.11 38.79 36.39 35.61 35.28 36.56

## 2016-08-29 NOTE — Assessment & Plan Note (Signed)
Importance of regular use is discussed and stressed, still not committed to nightly use

## 2016-08-29 NOTE — Assessment & Plan Note (Signed)
Managed by endo, reports improvement but still remains uncontrolled Kathryn Beck is reminded of the importance of commitment to daily physical activity for 30 minutes or more, as able and the need to limit carbohydrate intake to 30 to 60 grams per meal to help with blood sugar control.   The need to take medication as prescribed, test blood sugar as directed, and to call between visits if there is a concern that blood sugar is uncontrolled is also discussed.   Kathryn Beck is reminded of the importance of daily foot exam, annual eye examination, and good blood sugar, blood pressure and cholesterol control.  Diabetic Labs Latest Ref Rng & Units 06/09/2016 05/19/2016 02/28/2016 01/23/2016 01/14/2016  HbA1c <5.7 % 11.1(H) - 11.2(H) - 10.0(H)  Microalbumin Not estab mg/dL - - 75.9 - -  Micro/Creat Ratio <30 mcg/mg creat - - 566(H) - -  Chol <200 mg/dL - 249(H) 324(H) - 308(H)  HDL >50 mg/dL - 45(L) 53 - 47  Calc LDL <100 mg/dL - NOT CALC NOT CALC - NOT CALC  Triglycerides <150 mg/dL - 674(H) 827(H) - 591(H)  Creatinine 0.50 - 0.99 mg/dL 2.43(H) - 1.91(H) 2.51(H) 1.94(H)   BP/Weight 08/24/2016 08/18/2016 07/09/2016 06/25/2016 06/18/2016 06/17/2016 10/31/9145  Systolic BP 829 562 130 865 784 696 -  Diastolic BP 70 88 98 82 90 98 -  Wt. (Lbs) 226 222 226 212 214 212 213  BMI 38.79 38.11 38.79 36.39 35.61 35.28 36.56   Foot/eye exam completion dates Latest Ref Rng & Units 08/24/2016 01/13/2016  Eye Exam No Retinopathy - No Retinopathy  Foot Form Completion - Done -

## 2016-08-29 NOTE — Progress Notes (Signed)
Kathryn Beck     MRN: [id number]      DOB: [birth year]   HPI Kathryn Beck is here for follow up and re-evaluation of chronic medical conditions, medication management and review of any available recent lab and radiology data.  Preventive health is updated, specifically  Cancer screening and Immunization.   Questions or concerns regarding consultations or procedures which the PT has had in the interim are  Addressed.Has seen both cardiology and endo since her last visit, states blood sugar is improved The PT denies any adverse reactions to current medications since the last visit.  6 month h/o left shoulder and upper arm pain C/o rash and whelps with sun exposure which is usual for her  ROS Denies recent fever or chills. Denies sinus pressure, nasal congestion, ear pain or sore throat. Denies chest congestion, productive cough or wheezing. Denies chest pains, palpitations and leg swelling Denies abdominal pain, nausea, vomiting,diarrhea or constipation.   Denies dysuria, frequency, hesitancy or incontinence. . Denies headaches, seizures, numbness, or tingling. Denies depression, anxiety or insomnia.  PE  BP (!) 150/70   Pulse 98   Resp 16   Ht 5\' 4"  (1.626 m)   Wt 226 lb (102.5 kg)   SpO2 95%   BMI 38.79 kg/m   Patient alert and oriented and in no cardiopulmonary distress.  HEENT: No facial asymmetry, EOMI,   oropharynx pink and moist.  Neck decreased ROM no JVD, no mass.  Chest: Clear to auscultation bilaterally.  CVS: S1, S2 no murmurs, no S3.Regular rate.  ABD: Soft non tender.   Ext: No edema  MS: Adequate ROM spine,decreased in left  Shoulder, normal in hips and knees.  Skin: Intact, no ulcerations or rash noted.  Psych: Good eye contact, normal affect. Memory intact not anxious or depressed appearing.  CNS: CN 2-12 intact, power,  normal throughout.no focal deficits noted.   Assessment & Plan  Shoulder pain, left 6 month history of pain and reduced  mobility, likely from pathology in c spine, will x ray shoulder per pt request  Essential hypertension Uncontrolled and not at goal, will not icrease medication however DASH diet and commitment to daily physical activity for a minimum of 30 minutes discussed and encouraged, as a part of hypertension management. The importance of attaining a healthy weight is also discussed. Currently on multiple meds BP/Weight 08/24/2016 08/18/2016 07/09/2016 06/25/2016 06/18/2016 06/17/2016 06/29/8117  Systolic BP 147 829 562 130 865 784 -  Diastolic BP 70 88 98 82 90 98 -  Wt. 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   The need to take medication as prescribed, test blood sugar as directed, and to call between visits if there is a concern that blood sugar is uncontrolled is also discussed.   Kathryn Beck is reminded of the importance of daily foot exam, annual eye examination, and good blood sugar, blood pressure and cholesterol control.  Diabetic Labs Latest Ref Rng & Units  06/09/2016 05/19/2016 02/28/2016 01/23/2016 01/14/2016  HbA1c <5.7 % 11.1(H) - 11.2(H) - 10.0(H)  Microalbumin Not estab mg/dL - - 75.9 - -  Micro/Creat Ratio <30 mcg/mg creat - - 566(H) - -  Chol <200 mg/dL - 249(H) 324(H) - 308(H)  HDL >50 mg/dL - 45(L) 53 - 47  Calc LDL <100 mg/dL - NOT CALC NOT CALC - NOT CALC  Triglycerides <150 mg/dL - 674(H) 827(H) - 591(H)  Creatinine 0.50 - 0.99 mg/dL 2.43(H) - 1.91(H) 2.51(H) 1.94(H)    BP/Weight 08/24/2016 08/18/2016 07/09/2016 06/25/2016 06/18/2016 06/17/2016 9/39/0300  Systolic BP 923 300 762 263 335 456 -  Diastolic BP 70 88 98 82 90 98 -  Wt. (Lbs) 226 222 226 212 214 212 213  BMI 38.79 38.11 38.79 36.39 35.61 35.28 36.56   Foot/eye exam completion dates Latest Ref Rng & Units 08/24/2016 01/13/2016  Eye Exam No Retinopathy - No Retinopathy  Foot Form Completion - Done -        Seasonal allergies Controlled, no change in medication   Obesity Deteriorated. Patient re-educated about  the importance of commitment to a  minimum of 150 minutes of exercise per week.  The importance of healthy food choices with portion control discussed. Encouraged to start a food diary, count calories and to consider  joining a support group. Sample diet sheets offered. Goals set by the patient for the next several months.   Weight /BMI 08/24/2016 08/18/2016 07/09/2016  WEIGHT 226 lb 222 lb 226 lb  HEIGHT 5\' 4"  5\' 4"  5\' 4"   BMI 38.79 kg/m2 38.11 kg/m2 38.79 kg/m2      Mixed hyperlipidemia Uncontrolled Updated lab needed at/ before next visit. Hyperlipidemia:Low fat diet discussed and encouraged.   Lipid Panel  Lab Results  Component Value Date   CHOL 249 (H) 05/19/2016   HDL 45 (L) 05/19/2016   LDLCALC NOT CALC 05/19/2016   LDLDIRECT 107 05/19/2016   TRIG 674 (H) 05/19/2016   CHOLHDL 5.5 (H) 05/19/2016

## 2016-08-29 NOTE — Assessment & Plan Note (Signed)
Deteriorated. Patient re-educated about  the importance of commitment to a  minimum of 150 minutes of exercise per week.  The importance of healthy food choices with portion control discussed. Encouraged to start a food diary, count calories and to consider  joining a support group. Sample diet sheets offered. Goals set by the patient for the next several months.   Weight /BMI 08/24/2016 08/18/2016 07/09/2016  WEIGHT 226 lb 222 lb 226 lb  HEIGHT 5\' 4"  5\' 4"  5\' 4"   BMI 38.79 kg/m2 38.11 kg/m2 38.79 kg/m2

## 2016-08-29 NOTE — Assessment & Plan Note (Signed)
Uncontrolled Updated lab needed at/ before next visit. Hyperlipidemia:Low fat diet discussed and encouraged.   Lipid Panel  Lab Results  Component Value Date   CHOL 249 (H) 05/19/2016   HDL 45 (L) 05/19/2016   LDLCALC NOT CALC 05/19/2016   LDLDIRECT 107 05/19/2016   TRIG 674 (H) 05/19/2016   CHOLHDL 5.5 (H) 05/19/2016

## 2016-08-29 NOTE — Assessment & Plan Note (Signed)
Controlled, no change in medication  

## 2016-09-02 ENCOUNTER — Telehealth: Payer: Self-pay

## 2016-09-02 NOTE — Telephone Encounter (Signed)
lmom regarding last visit at our office and getting turned around in another office making her late to our appointment.

## 2016-09-07 ENCOUNTER — Telehealth: Payer: Self-pay | Admitting: "Endocrinology

## 2016-09-07 MED ORDER — GLUCOSE BLOOD VI STRP: ORAL_STRIP | 2 refills | Status: DC

## 2016-09-07 NOTE — Telephone Encounter (Signed)
Needs Test Strips for Accu-chek Guide test 4 x's daily called to [facility] in [place]

## 2016-09-09 ENCOUNTER — Ambulatory Visit: Payer: 59 | Admitting: Nutrition

## 2016-09-15 ENCOUNTER — Ambulatory Visit (INDEPENDENT_AMBULATORY_CARE_PROVIDER_SITE_OTHER): Payer: 59 | Admitting: Family Medicine

## 2016-09-15 ENCOUNTER — Encounter: Payer: Self-pay | Admitting: Family Medicine

## 2016-09-15 VITALS — BP 138/84 | HR 87 | Resp 16 | Ht 64.0 in | Wt 225.1 lb

## 2016-09-15 DIAGNOSIS — G4733 Obstructive sleep apnea (adult) (pediatric): Secondary | ICD-10-CM

## 2016-09-15 DIAGNOSIS — R600 Localized edema: Secondary | ICD-10-CM | POA: Diagnosis not present

## 2016-09-15 DIAGNOSIS — E1322 Other specified diabetes mellitus with diabetic chronic kidney disease: Secondary | ICD-10-CM | POA: Diagnosis not present

## 2016-09-15 DIAGNOSIS — I10 Essential (primary) hypertension: Secondary | ICD-10-CM | POA: Diagnosis not present

## 2016-09-15 DIAGNOSIS — E1365 Other specified diabetes mellitus with hyperglycemia: Secondary | ICD-10-CM

## 2016-09-15 DIAGNOSIS — N184 Chronic kidney disease, stage 4 (severe): Secondary | ICD-10-CM

## 2016-09-15 DIAGNOSIS — IMO0002 Reserved for concepts with insufficient information to code with codable children: Secondary | ICD-10-CM

## 2016-09-15 MED ORDER — FUROSEMIDE 40 MG PO TABS: 40.0000 mg | ORAL_TABLET | Freq: Every day | ORAL | 0 refills | Status: DC

## 2016-09-15 NOTE — Patient Instructions (Signed)
F/u as before , call if you need me sooner  CLEAN out the house please  Reduce your blood pressure pills back to original once daily  New for one week only is furosemide 40 mg to help with swelling  Non fasting chem 7 in 1 week  ELEVATE legs and wear stocking all day as able  Surgery will benefit overall health  NO SALT 1200mg  /day  recommended

## 2016-09-15 NOTE — Assessment & Plan Note (Addendum)
Bilateral leg edema, lasix 40 mg daily for 1 week, leg elevation and sodium restriction

## 2016-09-19 ENCOUNTER — Other Ambulatory Visit: Payer: Self-pay | Admitting: Family Medicine

## 2016-09-21 NOTE — Telephone Encounter (Signed)
Seen 6 26 18

## 2016-09-29 NOTE — Assessment & Plan Note (Addendum)
Re educated pt re need to work very hard on diabetes and hypertension to prolong life of kidneys she has severe kidney disease whih is a majot contributor tpo her bilaterla leg swelling Kathryn Beck is reminded of the importance of commitment to daily physical activity for 30 minutes or more, as able and the need to limit carbohydrate intake to 30 to 60 grams per meal to help with blood sugar control.   The need to take medication as prescribed, test blood sugar as directed, and to call between visits if there is a concern that blood sugar is uncontrolled is also discussed.   Kathryn Beck is reminded of the importance of daily foot exam, annual eye examination, and good blood sugar, blood pressure and cholesterol control.  Diabetic Labs Latest Ref Rng & Units 06/09/2016 05/19/2016 02/28/2016 01/23/2016 01/14/2016  HbA1c <5.7 % 11.1(H) - 11.2(H) - 10.0(H)  Microalbumin Not estab mg/dL - - 75.9 - -  Micro/Creat Ratio <30 mcg/mg creat - - 566(H) - -  Chol <200 mg/dL - 249(H) 324(H) - 308(H)  HDL >50 mg/dL - 45(L) 53 - 47  Calc LDL <100 mg/dL - NOT CALC NOT CALC - NOT CALC  Triglycerides <150 mg/dL - 674(H) 827(H) - 591(H)  Creatinine 0.50 - 0.99 mg/dL 2.43(H) - 1.91(H) 2.51(H) 1.94(H)   BP/Weight 09/15/2016 08/24/2016 08/18/2016 07/09/2016 06/25/2016 06/18/2016 06/03/9700  Systolic BP 637 858 850 277 412 878 676  Diastolic BP 84 70 88 98 82 90 98  Wt. (Lbs) 225.12 226 222 226 212 214 212  BMI 38.64 38.79 38.11 38.79 36.39 35.61 35.28   Foot/eye exam completion dates Latest Ref Rng & Units 08/24/2016 01/13/2016  Eye Exam No Retinopathy - No Retinopathy  Foot Form Completion - Done -   managed by endo

## 2016-09-29 NOTE — Assessment & Plan Note (Signed)
Controlled, no change in medication DASH diet and commitment to daily physical activity for a minimum of 30 minutes discussed and encouraged, as a part of hypertension management. The importance of attaining a healthy weight is also discussed.  BP/Weight 09/15/2016 08/24/2016 08/18/2016 07/09/2016 06/25/2016 06/18/2016 8/67/5449  Systolic BP 201 007 121 975 883 254 982  Diastolic BP 84 70 88 98 82 90 98  Wt. (Lbs) 225.12 226 222 226 212 214 212  BMI 38.64 38.79 38.11 38.79 36.39 35.61 35.28

## 2016-09-29 NOTE — Assessment & Plan Note (Signed)
Still inconsistently using CPAP, re educated re need to use every night

## 2016-09-29 NOTE — Assessment & Plan Note (Signed)
Deteriorated. Patient re-educated about  the importance of commitment to a  minimum of 150 minutes of exercise per week.  The importance of healthy food choices with portion control discussed. Encouraged to start a food diary, count calories and to consider  joining a support group. Sample diet sheets offered. Goals set by the patient for the next several months.   Weight /BMI 09/15/2016 08/24/2016 08/18/2016  WEIGHT 225 lb 1.9 oz 226 lb 222 lb  HEIGHT 5\' 4"  5\' 4"  5\' 4"   BMI 38.64 kg/m2 38.79 kg/m2 38.11 kg/m2

## 2016-09-29 NOTE — Progress Notes (Signed)
Kathryn Beck     MRN: [id number]      DOB: April 11, 1955   HPI Kathryn Beck is here with her spouse, man c/o progressive bilateral leg swelling to the extent that her skin is cracking. Persistent dietary indiscretion is reported , in the form of excessive snacks, sweets and  Denies PND or orthopnea and recent cardiology eval does report diastolic dysfunction    ROS Denies recent fever or chills. Denies sinus pressure, nasal congestion, ear pain or sore throat. Denies chest congestion, productive cough or wheezing. Denies chest pains, palpitations  Denies abdominal pain, nausea, vomiting,diarrhea or constipation.   Denies dysuria, frequency, hesitancy or incontinence. Denies joint pain, swelling and limitation in mobility. Denies headaches, seizures, numbness, or tingling. Denies depression, anxiety or insomnia. Denies skin break down or rash.   PE  BP 138/84   Pulse 87   Resp 16   Ht 5\' 4"  (1.626 m)   Wt 225 lb 1.9 oz (102.1 kg)   SpO2 96%   BMI 38.64 kg/m   Patient alert and oriented and in no cardiopulmonary distress.  HEENT: No facial asymmetry, EOMI,   oropharynx pink and moist.  Neck supple no JVD, no mass.  Chest: Clear to auscultation bilaterally.  CVS: S1, S2 no murmurs, no S3.Regular rate.  ABD: Soft non tender.   Ext: 2 plus pitting  edema  MS: Adequate ROM spine, shoulders, hips and knees.  Skin: Intact, no ulcerations or rash noted.  Psych: Good eye contact, normal affect. Memory intact not anxious or depressed appearing.  CNS: CN 2-12 intact, power,  normal throughout.no focal deficits noted.   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   The need to take medication as prescribed, test blood sugar as directed, and to call between visits if there is a concern that blood sugar is uncontrolled is also discussed.   Kathryn Beck is reminded of the importance of daily foot exam, annual eye examination, and good blood sugar, blood pressure and cholesterol control.  Diabetic Labs Latest Ref Rng & Units 06/09/2016 05/19/2016 02/28/2016 01/23/2016 01/14/2016  HbA1c <5.7 % 11.1(H) - 11.2(H) - 10.0(H)  Microalbumin Not estab mg/dL - - 75.9 - -  Micro/Creat Ratio <30 mcg/mg creat - - 566(H) - -  Chol <200 mg/dL - 249(H) 324(H) - 308(H)  HDL >50 mg/dL - 45(L) 53 - 47  Calc LDL <100 mg/dL - NOT CALC NOT CALC - NOT CALC  Triglycerides <150 mg/dL - 674(H) 827(H) - 591(H)  Creatinine 0.50 - 0.99 mg/dL 2.43(H) - 1.91(H) 2.51(H) 1.94(H)   BP/Weight 09/15/2016 08/24/2016 08/18/2016 07/09/2016 06/25/2016 06/18/2016 12/14/2681  Systolic BP 419 622 297 989 211 941 740  Diastolic BP 84 70 88 98 82 90 98  Wt. (Lbs) 225.12 226 222 226 212 214 212  BMI 38.64 38.79 38.11 38.79 36.39 35.61 35.28   Foot/eye exam completion dates Latest Ref Rng & Units 08/24/2016  01/13/2016  Eye Exam No Retinopathy - No Retinopathy  Foot Form Completion - Done -   managed by endo     Sleep apnea Still inconsistently using CPAP, re educated re need to use every night  Essential hypertension Controlled, no change in medication DASH diet and commitment to daily physical activity for a minimum of 30 minutes discussed and encouraged, as a part of hypertension management. The importance of attaining a healthy weight is also discussed.  BP/Weight 09/15/2016 08/24/2016 08/18/2016 07/09/2016 06/25/2016 06/18/2016 05/14/7979  Systolic BP 025 486 282 417  530 104 045  Diastolic BP 84 70 88 98 82 90 98  Wt. (Lbs) 225.12 226 222 226 212 214 212  BMI 38.64 38.79 38.11 38.79 36.39 35.61 35.28       Obesity Deteriorated. Patient re-educated about  the importance of commitment to a  minimum of 150 minutes of exercise per week.  The importance of healthy food choices with portion control discussed. Encouraged to start a food diary, count calories and to consider  joining a support group. Sample diet sheets offered. Goals set by the patient for the next several months.   Weight /BMI 09/15/2016 08/24/2016 08/18/2016  WEIGHT 225 lb 1.9 oz 226 lb 222 lb  HEIGHT 5\' 4"  5\' 4"  5\' 4"   BMI 38.64 kg/m2 38.79 kg/m2 38.11 kg/m2

## 2016-10-05 ENCOUNTER — Ambulatory Visit: Payer: 59 | Admitting: "Endocrinology

## 2016-10-12 ENCOUNTER — Ambulatory Visit: Payer: 59 | Admitting: "Endocrinology

## 2016-10-15 ENCOUNTER — Telehealth: Payer: Self-pay

## 2016-10-15 NOTE — Telephone Encounter (Signed)
Pt  Was on recall for her next colonoscopy. I have her scheduled an OV with Roseanne Kaufman, NP on 11/05/2016 at 9:30 AM for low hemoglobin.  I have called and LMOM for pt to return call and discuss if she has had any recent issues with anemia.  After discussing with Vicente Males, pt might just need to be triaged and the OV cancelled.

## 2016-10-16 ENCOUNTER — Telehealth: Payer: Self-pay

## 2016-10-16 NOTE — Telephone Encounter (Signed)
Pt was returning a call from DS. I told her that DS was off today and would be back on Monday. Please call patient at [phone]

## 2016-10-19 NOTE — Telephone Encounter (Signed)
cc'ed to pcp °

## 2016-10-19 NOTE — Telephone Encounter (Signed)
I spoke to pt and she has constipation to diarrhea. She feels she also needs upper endoscopy due to indigestion and it feels like her food does not go down good. She has hx of anemia, but thinks she doing very well now with that.  We will keep the previous appt on 11/05/2016 at 9:30 Am with Roseanne Kaufman, NP.

## 2016-10-20 NOTE — Telephone Encounter (Signed)
I spoke to the pt. She is not having any problems with anemia at this time, but she is having BM problems, intermittent diarrhea to constipation. Also, indigestion and thinks she might need EGD as well. So we are keeping the appt on 11/05/2016.

## 2016-10-22 ENCOUNTER — Ambulatory Visit (INDEPENDENT_AMBULATORY_CARE_PROVIDER_SITE_OTHER): Payer: 59 | Admitting: Otolaryngology

## 2016-10-22 ENCOUNTER — Other Ambulatory Visit: Payer: Self-pay | Admitting: "Endocrinology

## 2016-10-22 DIAGNOSIS — H9011 Conductive hearing loss, unilateral, right ear, with unrestricted hearing on the contralateral side: Secondary | ICD-10-CM

## 2016-10-22 DIAGNOSIS — H6121 Impacted cerumen, right ear: Secondary | ICD-10-CM

## 2016-10-23 ENCOUNTER — Other Ambulatory Visit: Payer: Self-pay

## 2016-10-23 MED ORDER — INSULIN ASPART 100 UNIT/ML FLEXPEN: 20.0000 [IU] | PEN_INJECTOR | Freq: Three times a day (TID) | SUBCUTANEOUS | 0 refills | Status: DC

## 2016-10-31 ENCOUNTER — Other Ambulatory Visit: Payer: Self-pay | Admitting: Family Medicine

## 2016-11-02 ENCOUNTER — Other Ambulatory Visit: Payer: Self-pay

## 2016-11-02 MED ORDER — INSULIN ASPART 100 UNIT/ML FLEXPEN: 20.0000 [IU] | PEN_INJECTOR | Freq: Three times a day (TID) | SUBCUTANEOUS | 0 refills | Status: DC

## 2016-11-05 ENCOUNTER — Encounter: Payer: Self-pay | Admitting: Gastroenterology

## 2016-11-05 ENCOUNTER — Other Ambulatory Visit: Payer: Self-pay

## 2016-11-05 ENCOUNTER — Ambulatory Visit (INDEPENDENT_AMBULATORY_CARE_PROVIDER_SITE_OTHER): Payer: 59 | Admitting: Gastroenterology

## 2016-11-05 VITALS — BP 182/90 | HR 101 | Temp 97.3°F | Ht 64.0 in | Wt 220.0 lb

## 2016-11-05 DIAGNOSIS — K5900 Constipation, unspecified: Secondary | ICD-10-CM

## 2016-11-05 DIAGNOSIS — R110 Nausea: Secondary | ICD-10-CM

## 2016-11-05 DIAGNOSIS — K581 Irritable bowel syndrome with constipation: Secondary | ICD-10-CM

## 2016-11-05 DIAGNOSIS — R1310 Dysphagia, unspecified: Secondary | ICD-10-CM

## 2016-11-05 MED ORDER — ONDANSETRON HCL 4 MG PO TABS: 4.0000 mg | ORAL_TABLET | Freq: Three times a day (TID) | ORAL | 1 refills | Status: DC | PRN

## 2016-11-05 MED ORDER — NA SULFATE-K SULFATE-MG SULF 17.5-3.13-1.6 GM/177ML PO SOLN: 1.0000 | ORAL | 0 refills | Status: DC

## 2016-11-05 NOTE — Assessment & Plan Note (Addendum)
Recurrent esophageal dysphagia in setting of GERD. I do not see a PPI listed on medication list, but she reports taking one and unable to remember the name. Last EGD with empiric dilation July 2017 by Dr. Laural Golden. No BPE on file. Likely multifactorial in setting of GERD, question on PPI or not, possible underlying motility disorder.   Patient to call me with what she is taking (PPI) BPE Consider EGD if needed at time of colonoscopy although I doubt this will be necessary.

## 2016-11-05 NOTE — Patient Instructions (Addendum)
Please call me with what reflux medication you are taking. Stop taking Bentyl. This can cause constipation.   I have sent in nausea medication called Zofran to take every 8 hours as needed. Please follow the gastroparesis diet attached.   For constipation: we have given samples of Linzess. Take 1 capsule each morning, 30 minutes before breakfast. This can cause loose stool at first, but it should get better. If not, call me.   We have scheduled you for an xray of your esophagus. This will help [place] know if you need an upper endoscopy or not.  You have been scheduled for a colonoscopy with possible upper endoscopy/dilation with Dr. Oneida Alar. Only take 1/2 dose of Levemir the evening before the procedure.   We will see you back in 3 months!

## 2016-11-05 NOTE — Assessment & Plan Note (Signed)
Query diabetic gastroparesis. No vomiting, weight increasing. No alarm symptoms. EGD on file last year. Add Zofran as needed, follow gastroparesis diet, will have patient call to inform me of what PPI she is taking.

## 2016-11-05 NOTE — Progress Notes (Signed)
cc'd to pcp 

## 2016-11-05 NOTE — Progress Notes (Addendum)
REVIEWED-NO ADDITIONAL RECOMMENDATIONS.   Referring Provider: Fayrene Helper, MD Primary Care Physician:  Fayrene Helper, MD Primary GI: Dr. Oneida Alar    Chief Complaint  Patient presents with  . Gastroesophageal Reflux    feels like food gets stuck in chest  . Constipation  . Diarrhea  . Abdominal Pain    lower abd when eats    HPI:   Kathryn Beck is a 61 y.o. female presenting today with a history of chronic gastritis, IBS, here for follow-up. Due for routine screening colonoscopy now. Last EGD in July 2017 by Dr. Laural Golden with chronic gastritis, empiric dilation.   Notes lower abdominal pain after eating, similar to symptoms last year when she went to the hospital. Has heartburn. Food stops mid esophagus. "Sours before digesting". Tastes sour. No vomiting. Gets nauseated on stomach. Nausea happening "right regular now". Feels nauseated after eating. Has not had weight loss. Review of epic reveals weight gain since last year. States she does take a medication for acid reflux but it is not on the list.   Will be constipated sometimes and then have diarrhea. Feels like constipation is more predominant. Sometimes will take a laxative. Used to deal with postprandial stool, now more constipated. BM once a day. Has to strain. Stomach hurting. Still taking Bentyl BID.   States she is allergic to the sun. Feels like she gets sick when she is out in the sun. Still works as [employer]arry manager. Feels tired. Wants to stop as she is so tired after working.    Past Medical History:  Diagnosis Date  . Anemia   . Axillary masses    Soft tissue - status post excision  . Back pain   . Essential hypertension   . Ischemic heart disease    Abnormal Myoview April 2018 - medical therapy  . Mixed hyperlipidemia   . Obesity   . Sleep apnea    Noncompliant with CPAP  . Type 2 diabetes mellitus, uncontrolled ([place])     Past Surgical History:  Procedure Laterality Date  . ABDOMINAL HYSTERECTOMY      . COLONOSCOPY  2008   Dr. Oneida Alar: normal   . ESOPHAGEAL DILATION N/A 10/13/2015   Procedure: ESOPHAGEAL DILATION;  Surgeon: Rogene Houston, MD;  Location: [facility];  Service: Endoscopy;  Laterality: N/A;  . ESOPHAGOGASTRODUODENOSCOPY N/A 10/13/2015   Dr. Laural Golden: chronic gastritis on path, no H.pylori. Empiric dilation   . FOOT SURGERY Bilateral   . LUNG BIOPSY    . MASS EXCISION Right 01/09/2013   Procedure: EXCISION OF NEOPLASM OF RIGHT  AXILLA  AND EXCISION OF NEOPLASM OF LEFT AXILLA;  Surgeon: Jamesetta So, MD;  Location: [facility]S;  Service: General;  Laterality: Right;  procedure end @ 08:23    Current Outpatient Prescriptions  Medication Sig Dispense Refill  . ACCU-CHEK FASTCLIX LANCETS MISC USE AS INSTRUCTED THREE  TIMES DAILY 306 each 5  . allopurinol (ZYLOPRIM) 300 MG tablet TAKE 1 TABLET (300 MG TOTAL) BY MOUTH DAILY. 30 tablet 3  . amLODipine (NORVASC) 10 MG tablet TAKE 1 TABLET BY MOUTH  DAILY 90 tablet 1  . azelastine (ASTELIN) 0.1 % nasal spray USE 2 SPRAYS INTO BOTH  NOSTRILS 2 (TWO) TIMES  DAILY AS DIRECTED (Patient taking differently: USE 2 SPRAYS INTO BOTH  NOSTRILS 2 (TWO) TIMES  DAILY AS DIRECTED. Takes as needed) 60 mL 3  . B-D UF III MINI PEN NEEDLES 31G X 5 MM MISC USE AS INSTRUCTED THREE  TIMES DAILY 200 each 0  . calcitRIOL (ROCALTROL) 0.25 MCG capsule One capsule three times weekly 36 capsule 0  . cloNIDine (CATAPRES) 0.1 MG tablet TAKE 1 TABLET BY MOUTH  EVERY EVENING AT 9PM FOR  BLOOD PRESSURE 90 tablet 1  . cyclobenzaprine (FLEXERIL) 10 MG tablet Take 1 tablet (10 mg total) by mouth at bedtime. 30 tablet 3  . dicyclomine (BENTYL) 10 MG capsule Take 1 capsule (10 mg total) by mouth 4 (four) times daily -  before meals and at bedtime. 120 capsule 3  . ezetimibe (ZETIA) 10 MG tablet Take 1 tablet (10 mg total) by mouth daily. 30 tablet 5  . furosemide (LASIX) 40 MG tablet Take 1 tablet (40 mg total) by mouth daily. 7 tablet 0  . glucose blood (ACCU-CHEK AVIVA  PLUS) test strip USE 4 TIMES DAILY E11.65 300 each 2  . glucose blood (ACCU-CHEK AVIVA PLUS) test strip USE 4 TIMES DAILY E11.65 300 each 2  . insulin aspart (NOVOLOG FLEXPEN) 100 UNIT/ML FlexPen Inject 20-26 Units into the skin 3 (three) times daily with meals. 90 mL 0  . Insulin Detemir (LEVEMIR FLEXTOUCH) 100 UNIT/ML Pen Inject 100 Units into the skin at bedtime. 30 pen 2  . labetalol (NORMODYNE) 200 MG tablet Take 2 tablets (400 mg total) by mouth 2 (two) times daily. 360 tablet 3  . montelukast (SINGULAIR) 10 MG tablet Take 10 mg by mouth at bedtime.    . rosuvastatin (CRESTOR) 5 MG tablet TAKE 1 TABLET (5 MG TOTAL) BY MOUTH DAILY. 30 tablet 3  . spironolactone (ALDACTONE) 25 MG tablet TAKE 1 TABLET BY MOUTH TWO  TIMES DAILY 180 tablet 1  . triamterene-hydrochlorothiazide (MAXZIDE) 75-50 MG tablet TAKE 1 TABLET BY MOUTH  DAILY 90 tablet 1  . valsartan (DIOVAN) 320 MG tablet TAKE 1 TABLET BY MOUTH EVERY DAY 90 tablet 1  . valsartan (DIOVAN) 320 MG tablet TAKE 1 TABLET BY MOUTH EVERY DAY 90 tablet 1  . Vitamin D, Ergocalciferol, (DRISDOL) 50000 units CAPS capsule TAKE 1 CAPSULE (50,000 UNITS TOTAL) BY MOUTH EVERY 7 (SEVEN) DAYS. 4 capsule 0  . ondansetron (ZOFRAN) 4 MG tablet Take 1 tablet (4 mg total) by mouth every 8 (eight) hours as needed for nausea or vomiting. 30 tablet 1   No current facility-administered medications for this visit.     Allergies as of 11/05/2016 - Review Complete 11/05/2016  Allergen Reaction Noted  . Ace inhibitors Anaphylaxis and Swelling 06/07/2007  . Penicillins Itching and Swelling 06/07/2007  . Statins Other (See Comments) 12/24/2012    Family History  Problem Relation Age of Onset  . Hypertension Father   . Colon cancer Neg Hx   . Colon polyps Neg Hx     Social History   Social History  . Marital status: Married    Spouse name: N/A  . Number of children: N/A  . Years of education: N/A   Social History Main Topics  . Smoking status: Never  Smoker  . Smokeless tobacco: Never Used  . Alcohol use No  . Drug use: No  . Sexual activity: Not Currently   Other Topics Concern  . None   Social History Narrative  . None    Review of Systems: As mentioned in HPI   Physical Exam: BP (!) 182/90 (did not take BP medication this morning)  Pulse (!) 101   Temp (!) 97.3 F (36.3 C) (Oral)   Ht 5\' 4"  (1.626 m)   Wt 220 lb (99.8  kg)   BMI 37.76 kg/m  General:   Alert and oriented. No distress noted. Pleasant and cooperative.  Head:  Normocephalic and atraumatic. Eyes:  Conjuctiva clear without scleral icterus. Mouth:  Oral mucosa pink and moist.  Lungs: Clear to auscultation bilaterally  Abdomen:  +BS, soft, obese, difficult to appreciate HSM due to larger AP diameter. non-tender and non-distended. No rebound or guarding.  Msk:  Symmetrical without gross deformities. Normal posture. Extremities:  With 1+ lower extremity edema  Neurologic:  Alert and  oriented x4 Psych:  Alert and cooperative. Normal mood and affect.

## 2016-11-05 NOTE — Assessment & Plan Note (Signed)
61 year old female with history of IBS more diarrhea predominant, now dealing with constipation. Lower abdominal discomfort likely as result of IBS-C. No alarm signs. Due for routine screening colonoscopy now. Stop Bentyl, as this could be contributing to constipation.   Proceed with colonoscopy with Dr. Oneida Alar in the near future. The risks, benefits, and alternatives have been discussed in detail with the patient. They state understanding and desire to proceed.  Phenergan 12.5 mg IV on call

## 2016-11-09 ENCOUNTER — Ambulatory Visit (HOSPITAL_COMMUNITY): Payer: 59

## 2016-11-12 ENCOUNTER — Ambulatory Visit (INDEPENDENT_AMBULATORY_CARE_PROVIDER_SITE_OTHER): Payer: 59 | Admitting: Otolaryngology

## 2016-11-13 ENCOUNTER — Other Ambulatory Visit (HOSPITAL_COMMUNITY): Payer: Self-pay | Admitting: Surgery

## 2016-11-17 LAB — BASIC METABOLIC PANEL
BUN: 32 mg/dL — AB (ref 7–25)
CO2: 22 mmol/L (ref 20–32)
Calcium: 9.1 mg/dL (ref 8.6–10.4)
Chloride: 104 mmol/L (ref 98–110)
Creat: 2.39 mg/dL — ABNORMAL HIGH (ref 0.50–0.99)
GLUCOSE: 309 mg/dL — AB (ref 65–99)
POTASSIUM: 4.6 mmol/L (ref 3.5–5.3)
SODIUM: 137 mmol/L (ref 135–146)

## 2016-11-19 ENCOUNTER — Other Ambulatory Visit: Payer: Self-pay | Admitting: "Endocrinology

## 2016-11-19 ENCOUNTER — Telehealth: Payer: Self-pay | Admitting: Family Medicine

## 2016-11-19 ENCOUNTER — Telehealth: Payer: Self-pay | Admitting: "Endocrinology

## 2016-11-19 NOTE — Telephone Encounter (Signed)
Pt notified that she will not be able to get any refills until she is seen by Dr Dorris Fetch.

## 2016-11-19 NOTE — Telephone Encounter (Signed)
noted 

## 2016-11-19 NOTE — Telephone Encounter (Signed)
Please see below. Patient left message on nurse line, she is wanting Dr. Moshe Cipro to write rx for insulin.

## 2016-11-19 NOTE — Telephone Encounter (Signed)
Patient is asking if Dr. Dorris Fetch would call her Humalog to 90 day supply andInsulin Detemir (LEVEMIR FLEXTOUCH) 100 UNIT/ML Pen for 90 days, please advise? [facility] [place]

## 2016-11-20 ENCOUNTER — Other Ambulatory Visit: Payer: Self-pay | Admitting: Family Medicine

## 2016-11-20 ENCOUNTER — Telehealth: Payer: Self-pay

## 2016-11-20 ENCOUNTER — Telehealth: Payer: Self-pay | Admitting: Family Medicine

## 2016-11-20 MED ORDER — INSULIN ASPART 100 UNIT/ML FLEXPEN: PEN_INJECTOR | SUBCUTANEOUS | 11 refills | Status: DC

## 2016-11-20 MED ORDER — INSULIN DETEMIR 100 UNIT/ML FLEXPEN: 100.0000 [IU] | PEN_INJECTOR | Freq: Every day | SUBCUTANEOUS | 11 refills | Status: DC

## 2016-11-20 NOTE — Telephone Encounter (Signed)
Spoke to patient. Advised to make appt.

## 2016-11-20 NOTE — Telephone Encounter (Signed)
This is already taken care of  I have sent in the medication and left a message on the patient's machine

## 2016-11-20 NOTE — Telephone Encounter (Signed)
Addressed by dr Moshe Cipro

## 2016-11-20 NOTE — Telephone Encounter (Signed)
Patient is out of her insulin (3rd day without).   Insurance cancelled and Dr. Liliane Channel office was working to find Rx cheaper.  Patient has to keep a log with meter readings in order to see Dr. Dorris Fetch. Please advise.   Patient cancelled last appt because she could not afford visit.  Patient in waiting area now requesting Rx now.  Would like to explain to the nurse because she feels that it is not her fault and wants someone to help her.

## 2016-11-20 NOTE — Telephone Encounter (Signed)
Please call in insulin today

## 2016-11-20 NOTE — Telephone Encounter (Signed)
Message left omn pt's machine as unable to speak directly with her that prescriptions for insulin have been sent to [place]

## 2016-11-20 NOTE — Progress Notes (Unsigned)
levemir pen scri[pt sent in

## 2016-11-20 NOTE — Telephone Encounter (Signed)
Noted  

## 2016-11-20 NOTE — Progress Notes (Unsigned)
novolog flex

## 2016-11-24 ENCOUNTER — Ambulatory Visit (INDEPENDENT_AMBULATORY_CARE_PROVIDER_SITE_OTHER): Payer: 59 | Admitting: Family Medicine

## 2016-11-24 ENCOUNTER — Encounter: Payer: Self-pay | Admitting: Family Medicine

## 2016-11-24 VITALS — BP 180/100 | HR 110 | Temp 98.4°F | Resp 14 | Ht 64.0 in | Wt 213.0 lb

## 2016-11-24 DIAGNOSIS — I10 Essential (primary) hypertension: Secondary | ICD-10-CM | POA: Diagnosis not present

## 2016-11-24 DIAGNOSIS — G4733 Obstructive sleep apnea (adult) (pediatric): Secondary | ICD-10-CM

## 2016-11-24 DIAGNOSIS — E782 Mixed hyperlipidemia: Secondary | ICD-10-CM

## 2016-11-24 DIAGNOSIS — Z9119 Patient's noncompliance with other medical treatment and regimen: Secondary | ICD-10-CM

## 2016-11-24 DIAGNOSIS — E1322 Other specified diabetes mellitus with diabetic chronic kidney disease: Secondary | ICD-10-CM | POA: Diagnosis not present

## 2016-11-24 DIAGNOSIS — Z91199 Patient's noncompliance with other medical treatment and regimen due to unspecified reason: Secondary | ICD-10-CM

## 2016-11-24 DIAGNOSIS — Z23 Encounter for immunization: Secondary | ICD-10-CM

## 2016-11-24 DIAGNOSIS — E1365 Other specified diabetes mellitus with hyperglycemia: Secondary | ICD-10-CM | POA: Diagnosis not present

## 2016-11-24 DIAGNOSIS — IMO0002 Reserved for concepts with insufficient information to code with codable children: Secondary | ICD-10-CM

## 2016-11-24 DIAGNOSIS — N184 Chronic kidney disease, stage 4 (severe): Secondary | ICD-10-CM

## 2016-11-24 MED ORDER — OLMESARTAN MEDOXOMIL 40 MG PO TABS: 40.0000 mg | ORAL_TABLET | Freq: Every day | ORAL | 1 refills | Status: DC

## 2016-11-24 MED ORDER — INSULIN LISPRO PROT & LISPRO (75-25 MIX) 100 UNIT/ML KWIKPEN: PEN_INJECTOR | SUBCUTANEOUS | 11 refills | Status: DC

## 2016-11-24 MED ORDER — TRIAMTERENE-HCTZ 75-50 MG PO TABS: 1.0000 | ORAL_TABLET | Freq: Every day | ORAL | 1 refills | Status: DC

## 2016-11-24 MED ORDER — SPIRONOLACTONE 25 MG PO TABS: 25.0000 mg | ORAL_TABLET | Freq: Two times a day (BID) | ORAL | 1 refills | Status: DC

## 2016-11-24 MED ORDER — INSULIN DETEMIR 100 UNIT/ML FLEXPEN: 100.0000 [IU] | PEN_INJECTOR | Freq: Every day | SUBCUTANEOUS | 11 refills | Status: DC

## 2016-11-24 MED ORDER — LABETALOL HCL 200 MG PO TABS: 400.0000 mg | ORAL_TABLET | Freq: Two times a day (BID) | ORAL | 3 refills | Status: DC

## 2016-11-24 MED ORDER — CLONIDINE HCL 0.1 MG PO TABS: ORAL_TABLET | ORAL | 1 refills | Status: DC

## 2016-11-24 NOTE — Patient Instructions (Signed)
F/u with MD in 6 weeks  Need to take all BP medications as printed and as reviewed with the nurse  You are referred to new endocrinologist in [place], their office will call  NEED TO test and record 4 times daily, the sliding scale you use is te one provided by Dr Dorris Fetch until you see new endo  If bedtime blood sugarr is 130 or less hav a snack of peanut butter acrackker x 2  Non fasting cmp and EGFR and hBA1C today when you leave   Flu vaccine today

## 2016-11-24 NOTE — Assessment & Plan Note (Signed)
Uncontrolled , needs to establish with new endo, will refer, needs to commit to 4 times daily testing with recording numbers., change to humalog for coverage

## 2016-11-25 ENCOUNTER — Ambulatory Visit: Payer: 59 | Admitting: Skilled Nursing Facility1

## 2016-11-26 LAB — HEMOGLOBIN A1C
Hgb A1c MFr Bld: 12.5 % of total Hgb — ABNORMAL HIGH (ref <5.7)
Mean Plasma Glucose: 312 (calc)
eAG (mmol/L): 17.3 (calc)

## 2016-11-26 LAB — COMPLETE METABOLIC PANEL WITH GFR
AG Ratio: 1 (calc) (ref 1.0–2.5)
ALBUMIN MSPROF: 2.9 g/dL — AB (ref 3.6–5.1)
ALKALINE PHOSPHATASE (APISO): 97 U/L (ref 33–130)
ALT: 21 U/L (ref 6–29)
AST: 18 U/L (ref 10–35)
BUN/Creatinine Ratio: 15 (calc) (ref 6–22)
BUN: 39 mg/dL — AB (ref 7–25)
CALCIUM: 9.2 mg/dL (ref 8.6–10.4)
CO2: 22 mmol/L (ref 20–32)
CREATININE: 2.63 mg/dL — AB (ref 0.50–0.99)
Chloride: 106 mmol/L (ref 98–110)
GFR, EST NON AFRICAN AMERICAN: 19 mL/min/{1.73_m2} — AB (ref >=60)
GFR, Est African American: 22 mL/min/{1.73_m2} — ABNORMAL LOW (ref >=60)
GLUCOSE: 318 mg/dL — AB (ref 65–99)
Globulin: 3 g/dL (calc) (ref 1.9–3.7)
Potassium: 5 mmol/L (ref 3.5–5.3)
Sodium: 138 mmol/L (ref 135–146)
TOTAL PROTEIN: 5.9 g/dL — AB (ref 6.1–8.1)
Total Bilirubin: 0.2 mg/dL (ref 0.2–1.2)

## 2016-11-30 ENCOUNTER — Encounter: Payer: Self-pay | Admitting: Skilled Nursing Facility1

## 2016-11-30 ENCOUNTER — Encounter: Payer: 59 | Attending: Surgery | Admitting: Skilled Nursing Facility1

## 2016-11-30 DIAGNOSIS — Z6836 Body mass index (BMI) 36.0-36.9, adult: Secondary | ICD-10-CM | POA: Diagnosis not present

## 2016-11-30 DIAGNOSIS — E119 Type 2 diabetes mellitus without complications: Secondary | ICD-10-CM

## 2016-11-30 NOTE — Progress Notes (Signed)
Pre-Op Assessment Visit:  Pre-Operative RYGB Surgery  Medical Nutrition Therapy:  Appt start time: 9:30  End time:  10:30  Patient was seen on 11/30/2016 for Pre-Operative Nutrition Assessment. Assessment and letter of approval faxed to [facility] coordinator on 11/30/2016.   Pt arrives about 6 hours early for her appointment and had trouble signing the necessary paperwork. Pt was recently in the hospital for dehydration stating she is allergic to the sun stating she gets blisters when it is hot (dietitian clarified this pt states it only occurs when it is hot). Pt states she has been retaining fluid in her legs and feet. Pt states she has had the kidney disease for about 2-3 years and had the diabetes for about 10 years. Pt states she is supposed to check her blood sugars 4 times a day but  Does not but does check it every day: fasting and before a meal: 220-230 and 200+. Pt states she has had her blood sugar so high the meter did not register. Pt states she has been working with an endocrinologist and a CDE for over a year. Pt states her husband has diabetes as well.  Pt states she does not wear her C-PAP but states she is doing better than she was. Pts A1C 12.5. Pt states she is tired all the time and has trouble breathing. Pts abdomen appears distended and tight. Pt states she has been trying to move more and watching what she is eating. Pt states she needs the surgery and is ready to make the changes. Pt states she cannot do it on her own and needs the surgery to help her make the change. Pt states she has been working with Dr. Moshe Cipro to be more compliant with her C-PAP machine and her medications. Pts wt increased from 140 pounds to over 200 pounds within 1 year when her adult weight was steady around 150 pounds well into her 50's.  Pt expectation of surgery: to feel better and have better weight and get off some medications  Pt expectation of Dietitian: pt  unable to answer  Start weight at [facility]: 213.8 BMI: 36.13: discussed with the pt  24 hr Dietary Recall: First Meal: sausage biscuit Snack:  Second Meal: squash and chicken casserole and black eyed peas Snack:  Third Meal: squash and chicken casserole and black eyed peas Snack:  Beverages: water, soda, coffee  Encouraged to engage in 150 minutes of moderate physical activity including cardiovascular and weight baring weekly  Handouts given during visit include:  . Pre-Op Goals . Bariatric Surgery Protein Shakes During the appointment today the following Pre-Op Goals were reviewed with the patient: . Maintain or lose weight as instructed by your surgeon . Make healthy food choices . Begin to limit portion sizes . Limited concentrated sugars and fried foods . Keep fat/sugar in the single digits per serving on             food labels . Practice CHEWING your food  (aim for 30 chews per bite or until applesauce consistency) . Practice not drinking 15 minutes before, during, and 30 minutes after each meal/snack . Avoid all carbonated beverages  . Avoid/limit caffeinated beverages  . Avoid all sugar-sweetened beverages . Consume 3 meals per day; eat every 3-5 hours . Make a list of non-food related activities . Aim for 64-100 ounces of FLUID daily  . Aim for at least 60-80 grams of PROTEIN daily . Look for a liquid protein source  that contain ?15 g protein and ?5 g carbohydrate  (ex: shakes, drinks, shots)  -Follow diet recommendations listed below   Energy and Macronutrient Recomendations: Calories: 1500 Carbohydrate: 170 Protein: 112 Fat: 42  Demonstrated degree of understanding via:  Teach Back  Teaching Method Utilized:  Visual Auditory Hands on  Barriers to learning/adherence to lifestyle change: unidentified at this time  Patient to call the Nutrition and Diabetes Education Services to enroll in Pre-Op and Post-Op Nutrition Education when surgery date is  scheduled.

## 2016-12-02 ENCOUNTER — Ambulatory Visit (HOSPITAL_COMMUNITY)
Admission: RE | Admit: 2016-12-02 | Discharge: 2016-12-02 | Disposition: A | Discharge status: Home / Self Care | Payer: 59 | Source: Ambulatory Visit | Attending: Surgery | Admitting: Surgery

## 2016-12-02 DIAGNOSIS — I700 Atherosclerosis of aorta: Secondary | ICD-10-CM | POA: Insufficient documentation

## 2016-12-02 DIAGNOSIS — J984 Other disorders of lung: Secondary | ICD-10-CM | POA: Diagnosis not present

## 2016-12-02 DIAGNOSIS — Z01810 Encounter for preprocedural cardiovascular examination: Secondary | ICD-10-CM | POA: Insufficient documentation

## 2016-12-06 ENCOUNTER — Encounter: Payer: Self-pay | Admitting: Family Medicine

## 2016-12-06 NOTE — Assessment & Plan Note (Signed)
Importance of compliance stressed

## 2016-12-06 NOTE — Assessment & Plan Note (Signed)
Uncontrolled due to non compliance DASH diet and commitment to daily physical activity for a minimum of 30 minutes discussed and encouraged, as a part of hypertension management. The importance of attaining a healthy weight is also discussed.  BP/Weight 11/30/2016 11/24/2016 11/05/2016 09/15/2016 08/24/2016 08/18/2016 5/52/5894  Systolic BP - 834 758 307 460 029 847  Diastolic BP - 308 90 84 70 88 98  Wt. (Lbs) 213.8 213 220 225.12 226 222 226  BMI 36.13 36.56 37.76 38.64 38.79 38.11 38.79

## 2016-12-06 NOTE — Assessment & Plan Note (Signed)
Uncontrolled Hyperlipidemia:Low fat diet discussed and encouraged.   Lipid Panel  Lab Results  Component Value Date   CHOL 249 (H) 05/19/2016   HDL 45 (L) 05/19/2016   LDLCALC NOT CALC 05/19/2016   LDLDIRECT 107 05/19/2016   TRIG 674 (H) 05/19/2016   CHOLHDL 5.5 (H) 05/19/2016  Updated lab needed at/ before next visit.

## 2016-12-06 NOTE — Assessment & Plan Note (Signed)
Has not been taking antihypertensive medication as prescribed and presents again with elevated blood pressure, re educated re the dangers of this behavior

## 2016-12-06 NOTE — Progress Notes (Signed)
Kathryn Beck     MRN: [id number]      DOB: [birth year]   HPI [name] is here for follow up and re-evaluation of chronic medical conditions, medication management and review of any available recent lab and radiology data.  Preventive health is updated, specifically  Cancer screening and Immunization.   Pt needs to establish with a new endocrinologist and she continues to be severely challenged as far as blood sugar control is concerned, she has had significant difficulty obtaining affordable medication d her blood sugar is uncontrolled. Has seen bariatric surgon who is being cautious about moving forward as [name] will need to demonstrate greater commitment to her chronic disease management before such a big commitment as bariatric surgery ROS Denies recent fever or chills Denies sinus pressure, nasal congestion, ear pain or sore throat. Denies chest congestion, productive cough or wheezing. Denies chest pains, palpitations and leg swelling Denies abdominal pain, nausea, vomiting,diarrhea or constipation.   Denies dysuria, frequency, hesitancy or incontinence. Denies joint pain, swelling and limitation in mobility. Denies headaches, seizures, numbness, or tingling. Denies depression, does have  anxiety over her health, she denies  insomnia. Denies skin break down or rash.   PE  BP (!) 180/100 (BP Location: Left Arm, Patient Position: Sitting, Cuff Size: Large)   Pulse (!) 110   Temp 98.4 F (36.9 C)   Ht 5\' 4"  (1.626 m)   Wt 213 lb (96.6 kg)   SpO2 97%   BMI 36.56 kg/m   Patient alert and oriented and in no cardiopulmonary distress.  HEENT: No facial asymmetry, EOMI,   oropharynx pink and moist.  Neck supple no JVD, no mass.  Chest: Clear to auscultation bilaterally.  CVS: S1, S2 no murmurs, no S3.Regular rate.  ABD: Soft non tender.   Ext: No edema  MS: Adequate ROM spine, shoulders, hips and knees.  Skin: Intact, no ulcerations or rash noted.  Psych: Good  eye contact, normal affect. Memory intact not anxious or depressed appearing.  CNS: CN 2-12 intact, power,  normal throughout.no focal deficits noted.   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) (HCC) Uncontrolled , needs to establish with new endo, will refer, needs to commit to 4 times daily testing with recording numbers., change to humalog for coverage  Personal history of noncompliance with medical treatment, presenting hazards to health Has not been taking antihypertensive medication as prescribed and presents again with elevated blood pressure, re educated re the dangers of this behavior  Essential hypertension Uncontrolled due to non compliance DASH diet and commitment to daily physical activity for a minimum of 30 minutes discussed and encouraged, as a part of hypertension management. The importance of attaining a healthy weight is also discussed.  BP/Weight 11/30/2016 11/24/2016 11/05/2016 09/15/2016 08/24/2016 08/18/2016 6/33/3545  Systolic BP - 625 638 937 342 876 811  Diastolic BP - 572 90 84 70 88 98  Wt. (Lbs) 213.8 213 220 225.12 226 222 226  BMI 36.13 36.56 37.76 38.64 38.79 38.11 38.79       Mixed hyperlipidemia Uncontrolled Hyperlipidemia:Low fat diet discussed and encouraged.   Lipid Panel  Lab Results  Component Value Date   CHOL 249 (H) 05/19/2016   HDL 45 (L) 05/19/2016   LDLCALC NOT CALC 05/19/2016   LDLDIRECT 107 05/19/2016   TRIG 674 (H) 05/19/2016   CHOLHDL 5.5 (H) 05/19/2016  Updated lab needed at/ before next visit.     Sleep apnea Importance  of compliance stressed

## 2016-12-07 ENCOUNTER — Telehealth: Payer: Self-pay | Admitting: General Practice

## 2016-12-07 NOTE — Telephone Encounter (Signed)
Kathryn Beck with [facility] (Dr. Moshe Cipro) calling to check in on patient's referral to Dr. Cruzita Lederer. Please call and advise.

## 2016-12-09 ENCOUNTER — Telehealth: Payer: Self-pay

## 2016-12-09 NOTE — Telephone Encounter (Signed)
Left message advising patient to try claritin or zyrtec and if not better she needs an appointment.

## 2016-12-09 NOTE — Telephone Encounter (Signed)
OTC claritin or zyrtec one daily o

## 2016-12-09 NOTE — Telephone Encounter (Signed)
Patient called requesting something for a sinus infection.  She complains of pressure between her ears.  Please advise.

## 2016-12-15 ENCOUNTER — Other Ambulatory Visit: Payer: Self-pay | Admitting: Family Medicine

## 2016-12-15 DIAGNOSIS — Z1231 Encounter for screening mammogram for malignant neoplasm of breast: Secondary | ICD-10-CM

## 2016-12-16 ENCOUNTER — Ambulatory Visit (HOSPITAL_COMMUNITY)
Admission: RE | Admit: 2016-12-16 | Discharge: 2016-12-16 | Disposition: A | Discharge status: Home / Self Care | Payer: 59 | Source: Ambulatory Visit | Attending: Gastroenterology | Admitting: Gastroenterology

## 2016-12-16 ENCOUNTER — Ambulatory Visit (HOSPITAL_COMMUNITY)
Admission: RE | Admit: 2016-12-16 | Discharge: 2016-12-16 | Disposition: A | Discharge status: Home / Self Care | Payer: 59 | Source: Ambulatory Visit | Attending: Family Medicine | Admitting: Family Medicine

## 2016-12-16 DIAGNOSIS — Z1231 Encounter for screening mammogram for malignant neoplasm of breast: Secondary | ICD-10-CM

## 2016-12-16 DIAGNOSIS — R1310 Dysphagia, unspecified: Secondary | ICD-10-CM | POA: Diagnosis present

## 2016-12-17 ENCOUNTER — Ambulatory Visit (HOSPITAL_COMMUNITY): Payer: 59

## 2016-12-18 ENCOUNTER — Encounter (HOSPITAL_COMMUNITY): Payer: Self-pay | Admitting: *Deleted

## 2016-12-18 ENCOUNTER — Encounter (HOSPITAL_COMMUNITY)
Admission: RE | Disposition: A | Discharge status: Home Health | Payer: Self-pay | Source: Ambulatory Visit | Attending: Gastroenterology

## 2016-12-18 ENCOUNTER — Ambulatory Visit (HOSPITAL_COMMUNITY)
Admission: RE | Admit: 2016-12-18 | Discharge: 2016-12-18 | Disposition: A | Discharge status: Home Health | Payer: 59 | Source: Ambulatory Visit | Attending: Gastroenterology | Admitting: Gastroenterology

## 2016-12-18 DIAGNOSIS — Z6836 Body mass index (BMI) 36.0-36.9, adult: Secondary | ICD-10-CM | POA: Diagnosis not present

## 2016-12-18 DIAGNOSIS — Z79899 Other long term (current) drug therapy: Secondary | ICD-10-CM | POA: Insufficient documentation

## 2016-12-18 DIAGNOSIS — Q438 Other specified congenital malformations of intestine: Secondary | ICD-10-CM | POA: Diagnosis not present

## 2016-12-18 DIAGNOSIS — E669 Obesity, unspecified: Secondary | ICD-10-CM | POA: Insufficient documentation

## 2016-12-18 DIAGNOSIS — K5900 Constipation, unspecified: Secondary | ICD-10-CM

## 2016-12-18 DIAGNOSIS — N189 Chronic kidney disease, unspecified: Secondary | ICD-10-CM | POA: Diagnosis not present

## 2016-12-18 DIAGNOSIS — Z1211 Encounter for screening for malignant neoplasm of colon: Secondary | ICD-10-CM

## 2016-12-18 DIAGNOSIS — Z880 Allergy status to penicillin: Secondary | ICD-10-CM | POA: Insufficient documentation

## 2016-12-18 DIAGNOSIS — I129 Hypertensive chronic kidney disease with stage 1 through stage 4 chronic kidney disease, or unspecified chronic kidney disease: Secondary | ICD-10-CM | POA: Insufficient documentation

## 2016-12-18 DIAGNOSIS — I259 Chronic ischemic heart disease, unspecified: Secondary | ICD-10-CM | POA: Insufficient documentation

## 2016-12-18 DIAGNOSIS — R1310 Dysphagia, unspecified: Secondary | ICD-10-CM

## 2016-12-18 DIAGNOSIS — E1122 Type 2 diabetes mellitus with diabetic chronic kidney disease: Secondary | ICD-10-CM | POA: Insufficient documentation

## 2016-12-18 DIAGNOSIS — G4730 Sleep apnea, unspecified: Secondary | ICD-10-CM | POA: Insufficient documentation

## 2016-12-18 DIAGNOSIS — Z90710 Acquired absence of both cervix and uterus: Secondary | ICD-10-CM | POA: Insufficient documentation

## 2016-12-18 DIAGNOSIS — E782 Mixed hyperlipidemia: Secondary | ICD-10-CM | POA: Insufficient documentation

## 2016-12-18 DIAGNOSIS — K2970 Gastritis, unspecified, without bleeding: Secondary | ICD-10-CM | POA: Diagnosis not present

## 2016-12-18 DIAGNOSIS — D128 Benign neoplasm of rectum: Secondary | ICD-10-CM | POA: Diagnosis not present

## 2016-12-18 DIAGNOSIS — Z9119 Patient's noncompliance with other medical treatment and regimen: Secondary | ICD-10-CM | POA: Insufficient documentation

## 2016-12-18 DIAGNOSIS — Z888 Allergy status to other drugs, medicaments and biological substances status: Secondary | ICD-10-CM | POA: Diagnosis not present

## 2016-12-18 DIAGNOSIS — K648 Other hemorrhoids: Secondary | ICD-10-CM | POA: Diagnosis not present

## 2016-12-18 DIAGNOSIS — Z794 Long term (current) use of insulin: Secondary | ICD-10-CM | POA: Insufficient documentation

## 2016-12-18 HISTORY — PX: COLONOSCOPY: SHX5424

## 2016-12-18 HISTORY — PX: SAVORY DILATION: SHX5439

## 2016-12-18 HISTORY — PX: ESOPHAGOGASTRODUODENOSCOPY: SHX5428

## 2016-12-18 LAB — GLUCOSE, CAPILLARY
GLUCOSE-CAPILLARY: 191 mg/dL — AB (ref 65–99)
Glucose-Capillary: 255 mg/dL — ABNORMAL HIGH (ref 65–99)

## 2016-12-18 SURGERY — COLONOSCOPY
Anesthesia: Moderate Sedation

## 2016-12-18 MED ORDER — DEXLANSOPRAZOLE 60 MG PO CPDR: DELAYED_RELEASE_CAPSULE | ORAL | 11 refills | Status: DC

## 2016-12-18 MED ORDER — LIDOCAINE VISCOUS 2 % MT SOLN
OROMUCOSAL | Status: DC | PRN
  Administered 2016-12-18: 1 via OROMUCOSAL

## 2016-12-18 MED ORDER — PROMETHAZINE HCL 25 MG/ML IJ SOLN: 12.5000 mg | Freq: Once | INTRAMUSCULAR | Status: DC

## 2016-12-18 MED ORDER — STERILE WATER FOR IRRIGATION IR SOLN
Status: DC | PRN
  Administered 2016-12-18: 2.5 mL

## 2016-12-18 MED ORDER — FENTANYL CITRATE (PF) 100 MCG/2ML IJ SOLN
INTRAMUSCULAR | Status: AC
  Filled 2016-12-18: qty 2

## 2016-12-18 MED ORDER — SODIUM CHLORIDE 0.9% FLUSH
INTRAVENOUS | Status: AC
  Filled 2016-12-18: qty 10

## 2016-12-18 MED ORDER — MIDAZOLAM HCL 5 MG/5ML IJ SOLN
INTRAMUSCULAR | Status: AC
  Filled 2016-12-18: qty 10

## 2016-12-18 MED ORDER — MIDAZOLAM HCL 5 MG/5ML IJ SOLN
INTRAMUSCULAR | Status: DC | PRN
  Administered 2016-12-18 (×3): 2 mg via INTRAVENOUS
  Administered 2016-12-18: 1 mg via INTRAVENOUS

## 2016-12-18 MED ORDER — LIDOCAINE VISCOUS 2 % MT SOLN
OROMUCOSAL | Status: AC
  Filled 2016-12-18: qty 15

## 2016-12-18 MED ORDER — DICYCLOMINE HCL 10 MG PO CAPS: ORAL_CAPSULE | ORAL | 3 refills | Status: DC

## 2016-12-18 MED ORDER — FENTANYL CITRATE (PF) 100 MCG/2ML IJ SOLN
INTRAMUSCULAR | Status: DC | PRN
  Administered 2016-12-18 (×2): 25 ug via INTRAVENOUS
  Administered 2016-12-18: 50 ug via INTRAVENOUS

## 2016-12-18 MED ORDER — PROMETHAZINE HCL 25 MG/ML IJ SOLN
INTRAMUSCULAR | Status: AC
Start: 2016-12-18 — End: 2016-12-18
  Administered 2016-12-18: 12.5 mg
  Filled 2016-12-18: qty 1

## 2016-12-18 MED ORDER — SODIUM CHLORIDE 0.9 % IV SOLN
INTRAVENOUS | Status: DC
  Administered 2016-12-18: 1000 mL via INTRAVENOUS

## 2016-12-18 NOTE — H&P (Signed)
Primary Care Physician:  Fayrene Helper, MD Primary Gastroenterologist:  Dr. Oneida Alar  Pre-Procedure History & Physical: HPI:  Kathryn Beck is a 61 y.o. female here for DYSPHAGIA/screening.   Past Medical History:  Diagnosis Date  . Anemia   . Axillary masses    Soft tissue - status post excision  . Back pain   . Chronic kidney disease   . Essential hypertension   . Ischemic heart disease    Abnormal Myoview April 2018 - medical therapy  . Mixed hyperlipidemia   . Obesity   . Sleep apnea    Noncompliant with CPAP  . Type 2 diabetes mellitus, uncontrolled ([place])     Past Surgical History:  Procedure Laterality Date  . ABDOMINAL HYSTERECTOMY    . COLONOSCOPY  2008   Dr. Oneida Alar: normal   . ESOPHAGEAL DILATION N/A 10/13/2015   Procedure: ESOPHAGEAL DILATION;  Surgeon: Rogene Houston, MD;  Location: [facility];  Service: Endoscopy;  Laterality: N/A;  . ESOPHAGOGASTRODUODENOSCOPY N/A 10/13/2015   Dr. Laural Golden: chronic gastritis on path, no H.pylori. Empiric dilation   . FOOT SURGERY Bilateral   . LUNG BIOPSY    . MASS EXCISION Right 01/09/2013   Procedure: EXCISION OF NEOPLASM OF RIGHT  AXILLA  AND EXCISION OF NEOPLASM OF LEFT AXILLA;  Surgeon: Jamesetta So, MD;  Location: [facility] ORS;  Service: General;  Laterality: Right;  procedure end @ 08:23    Prior to Admission medications   Medication Sig Start Date End Date Taking? Authorizing Provider  ACCU-CHEK FASTCLIX LANCETS MISC USE AS INSTRUCTED THREE  TIMES DAILY 03/05/16  Yes Fayrene Helper, MD  allopurinol (ZYLOPRIM) 300 MG tablet TAKE 1 TABLET (300 MG TOTAL) BY MOUTH DAILY. 10/28/15  Yes Fayrene Helper, MD  amLODipine (NORVASC) 10 MG tablet TAKE 1 TABLET BY MOUTH  DAILY 03/05/16  Yes Fayrene Helper, MD  azelastine (ASTELIN) 0.1 % nasal spray Place 2 sprays into both nostrils 2 (two) times daily as needed for rhinitis. Use in each nostril as directed   Yes [provider]  B-D UF III MINI PEN NEEDLES  31G X 5 MM MISC USE AS INSTRUCTED THREE  TIMES DAILY 05/20/16  Yes Fayrene Helper, MD  calcitRIOL (ROCALTROL) 0.25 MCG capsule One capsule three times weekly ( Monday, Wednesday, Friday) 09/15/16  Yes Fayrene Helper, MD  cloNIDine (CATAPRES) 0.1 MG tablet TAKE 1 TABLET BY MOUTH  EVERY EVENING AT 9PM FOR  BLOOD PRESSURE 11/24/16  Yes Fayrene Helper, MD  cyclobenzaprine (FLEXERIL) 10 MG tablet Take 1 tablet (10 mg total) by mouth at bedtime. 10/22/15  Yes Fayrene Helper, MD  ezetimibe (ZETIA) 10 MG tablet Take 1 tablet (10 mg total) by mouth daily. 01/31/16  Yes Fayrene Helper, MD  furosemide (LASIX) 40 MG tablet Take 1 tablet (40 mg total) by mouth daily. 09/15/16  Yes Fayrene Helper, MD  Insulin Detemir (LEVEMIR FLEXTOUCH) 100 UNIT/ML Pen Inject 100 Units into the skin at bedtime. 06/17/16  Yes Nida, Marella Chimes, MD  Insulin Lispro Prot & Lispro (HUMALOG MIX 75/25 KWIKPEN) (75-25) 100 UNIT/ML Kwikpen Inject  20 to 26 units before each meal per sliding scale coverage 11/24/16  Yes Fayrene Helper, MD  labetalol (NORMODYNE) 200 MG tablet Take 2 tablets (400 mg total) by mouth 2 (two) times daily. 11/24/16  Yes Fayrene Helper, MD  montelukast (SINGULAIR) 10 MG tablet Take 10 mg by mouth at bedtime.   Yes [provider]  olmesartan (BENICAR) 40 MG tablet Take 1 tablet (40 mg total) by mouth daily. 11/24/16  Yes Fayrene Helper, MD  rosuvastatin (CRESTOR) 5 MG tablet TAKE 1 TABLET (5 MG TOTAL) BY MOUTH DAILY. 11/02/16  Yes Fayrene Helper, MD  spironolactone (ALDACTONE) 25 MG tablet Take 1 tablet (25 mg total) by mouth 2 (two) times daily. 11/24/16  Yes Fayrene Helper, MD  triamterene-hydrochlorothiazide (MAXZIDE) 75-50 MG tablet Take 1 tablet by mouth daily. 11/24/16  Yes Fayrene Helper, MD  Vitamin D, Ergocalciferol, (DRISDOL) 50000 units CAPS capsule TAKE 1 CAPSULE (50,000 UNITS TOTAL) BY MOUTH EVERY 7 (SEVEN) DAYS. Patient taking differently: Take  50,000 Units by mouth every 7 (seven) days. Takes on Monday 08/24/16  Yes Fayrene Helper, MD  acetaminophen (TYLENOL) 500 MG tablet Take 1,000 mg by mouth every 6 (six) hours as needed for mild pain, moderate pain or headache.    [provider]  ciprofloxacin-dexamethasone (CIPRODEX) OTIC suspension Place 4 drops into both ears 2 (two) times daily.    [provider]  dicyclomine (BENTYL) 10 MG capsule Take 1 capsule (10 mg total) by mouth 4 (four) times daily -  before meals and at bedtime. 10/29/15   Annitta Needs, NP  glucose blood (ACCU-CHEK AVIVA PLUS) test strip USE 4 TIMES DAILY E11.65 09/07/16   Cassandria Anger, MD  ondansetron (ZOFRAN) 4 MG tablet Take 1 tablet (4 mg total) by mouth every 8 (eight) hours as needed for nausea or vomiting. 11/05/16   Annitta Needs, NP    Allergies as of 11/05/2016 - Review Complete 11/05/2016  Allergen Reaction Noted  . Ace inhibitors Anaphylaxis and Swelling 06/07/2007  . Penicillins Itching and Swelling 06/07/2007  . Statins Other (See Comments) 12/24/2012    Family History  Problem Relation Age of Onset  . Hypertension Father   . Hypercholesterolemia Father   . Arthritis Father   . Hypertension Sister   . Hypercholesterolemia Sister   . Breast cancer Sister   . Hypertension Sister   . Colon cancer Neg Hx   . Colon polyps Neg Hx     Social History   Social History  . Marital status: Married    Spouse name: N/A  . Number of children: N/A  . Years of education: N/A   Occupational History  . Not on file.   Social History Main Topics  . Smoking status: Never Smoker  . Smokeless tobacco: Never Used  . Alcohol use No  . Drug use: No  . Sexual activity: Not Currently   Other Topics Concern  . Not on file   Social History Narrative  . No narrative on file    Review of Systems: See HPI, otherwise negative ROS   Physical Exam: BP 138/76   Pulse 93   Temp 98.8 F (37.1 C) (Oral)   Resp 18   Ht 5\' 4"   (1.626 m)   Wt 212 lb (96.2 kg)   SpO2 100%   BMI 36.39 kg/m  General:   Alert,  pleasant and cooperative in NAD Head:  Normocephalic and atraumatic. Neck:  Supple; Lungs:  Clear throughout to auscultation.    Heart:  Regular rate and rhythm. Abdomen:  Soft, nontender and nondistended. Normal bowel sounds, without guarding, and without rebound.   Neurologic:  Alert and  oriented x4;  grossly normal neurologically.  Impression/Plan:     DYSPHAGIA/screening  PLAN:  EGD/DIL/tcs TODAY DISCUSSED PROCEDURE, BENEFITS, & RISKS: < 1% chance of  medication reaction, bleeding, perforation, or rupture of spleen/liver.

## 2016-12-18 NOTE — Op Note (Signed)
[facility] Patient Name: Kathryn Beck Procedure Date: 12/18/2016 6:24 PM MRN: [id number] Date of Birth: 26-Sep-1955 Attending MD: Barney Drain MD, MD CSN: [id number] Age: 61 Admit Type: Outpatient Procedure:                Upper GI endoscopy with BRAVO CAPSULE PLACEMENT Indications:              Dysphagia Providers:                Barney Drain MD, MD, Janeece Riggers, RN, Lurline Del, RN Referring MD:             Norwood Levo. Simpson MD, MD Medicines:                TCS + Meperidine 25 mg IV, Midazolam 2 mg IV Complications:            No immediate complications. Estimated Blood Loss:     Estimated blood loss: none. Procedure:                Pre-Anesthesia Assessment:                           - Prior to the procedure, a History and Physical                            was performed, and patient medications and                            allergies were reviewed. The patient's tolerance of                            previous anesthesia was also reviewed. The risks                            and benefits of the procedure and the sedation                            options and risks were discussed with the patient.                            All questions were answered, and informed consent                            was obtained. Prior Anticoagulants: The patient has                            taken no previous anticoagulant or antiplatelet                            agents. ASA Grade Assessment: II - A patient with                            mild systemic disease. After reviewing the risks                            and benefits, the patient was deemed in  satisfactory condition to undergo the procedure.                            After obtaining informed consent, the endoscope was                            passed under direct vision. Throughout the                            procedure, the patient's blood pressure, pulse, and                            oxygen  saturations were monitored continuously. The                            [id number] ([id number]) scope was introduced through the                            mouth, and advanced to the second part of duodenum.                            The upper GI endoscopy was technically difficult                            and complex due to the patient's body habitus. The                            patient tolerated the procedure fairly well. Scope In: 6:30:23 PM Scope Out: 6:38:13 PM Total Procedure Duration: 0 hours 7 minutes 50 seconds  Findings:      The examined esophagus was normal. BRAVO CASPSULE PLACE 31 CM FROM THE       INCISORS. EGJ 37 CM FROM THE INCISORS.      Mild inflammation characterized by congestion (edema) and erythema was       found in the gastric fundus and in the gastric antrum. NO PYLORIC       STENOSIS      The examined duodenum was normal. Impression:               - NO SOURCE FOR DYSPHAGIA IDENTIFIED                           - MILD Gastritis. Moderate Sedation:      Moderate (conscious) sedation was administered by the endoscopy nurse       and supervised by the endoscopist. The following parameters were       monitored: oxygen saturation, heart rate, blood pressure, and response       to care. Total physician intraservice time was 53 minutes. Recommendation:           - High fiber diet.                           - Continue present medications.                           - Await pathology results. RETURN BRAVO  ON OCT 1.                           - Return to my office in 4 months.                           - Patient has a contact number available for                            emergencies. The signs and symptoms of potential                            delayed complications were discussed with the                            patient. Return to normal activities tomorrow.                            Written discharge instructions were provided to the                             patient. Procedure Code(s):        --- Professional ---                           [phone], Esophagogastroduodenoscopy, flexible,                            transoral; diagnostic, including collection of                            specimen(s) by brushing or washing, when performed                            (separate procedure)                           99152, Moderate sedation services provided by the                            same physician or other qualified health care                            professional performing the diagnostic or                            therapeutic service that the sedation supports,                            requiring the presence of an independent trained                            observer to assist in the monitoring of the                            patient's level  of consciousness and physiological                            status; initial 15 minutes of intraservice time,                            patient age 44 years or older                           [phone], Moderate sedation services; each additional                            15 minutes intraservice time                           99153, Moderate sedation services; each additional                            15 minutes intraservice time                           99153, Moderate sedation services; each additional                            15 minutes intraservice time Diagnosis Code(s):        --- Professional ---                           K29.70, Gastritis, unspecified, without bleeding                           R13.10, Dysphagia, unspecified CPT copyright 2016 [employer] Medical Association. All rights reserved. The codes documented in this report are preliminary and upon coder review may  be revised to meet current compliance requirements. Barney Drain, MD Barney Drain MD, MD 12/18/2016 6:55:06 PM This report has been signed electronically. Number of Addenda: 0

## 2016-12-18 NOTE — Op Note (Signed)
[facility] Patient Name: Kathryn Beck Procedure Date: 12/18/2016 5:54 PM MRN: [id number] Date of Birth: 1956/02/27 Attending MD: Barney Drain MD, MD CSN: [id number] Age: 61 Admit Type: Outpatient Procedure:                Colonoscopy with COLD FORCEPS/SNARE & SNARE CAUTERY                            POLYPECTOMY Indications:              Screening for colorectal malignant neoplasm Providers:                Barney Drain MD, MD, Janeece Riggers, RN, Lurline Del, RN Referring MD:             Norwood Levo. Simpson MD, MD Medicines:                Promethazine 12.5 mg IV, Meperidine 100 mg IV,                            Midazolam 5 mg IV Complications:            No immediate complications. Estimated Blood Loss:     Estimated blood loss was minimal. Procedure:                Pre-Anesthesia Assessment:                           - Prior to the procedure, a History and Physical                            was performed, and patient medications and                            allergies were reviewed. The patient's tolerance of                            previous anesthesia was also reviewed. The risks                            and benefits of the procedure and the sedation                            options and risks were discussed with the patient.                            All questions were answered, and informed consent                            was obtained. Prior Anticoagulants: The patient has                            taken no previous anticoagulant or antiplatelet                            agents. ASA Grade Assessment: II - A patient with  mild systemic disease. After reviewing the risks                            and benefits, the patient was deemed in                            satisfactory condition to undergo the procedure.                            After obtaining informed consent, the colonoscope                            was passed under direct  vision. Throughout the                            procedure, the patient's blood pressure, pulse, and                            oxygen saturations were monitored continuously. The                            [id number] ([id number]) scope was introduced through                            the anus and advanced to the the cecum, identified                            by appendiceal orifice and ileocecal valve. The                            colonoscopy was somewhat difficult due to a                            tortuous colon. Successful completion of the                            procedure was aided by straightening and shortening                            the scope to obtain bowel loop reduction and                            COLOWRAP. The patient tolerated the procedure                            fairly well. The quality of the bowel preparation                            was good. The ileocecal valve, appendiceal orifice,                            and rectum were photographed. Scope In: 6:01:11 PM Scope Out: 6:21:51 PM Scope Withdrawal Time: 0 hours 15 minutes 20 seconds  Total  Procedure Duration: 0 hours 20 minutes 40 seconds  Findings:      A 4 mm polyp was found in the rectum. The polyp was sessile. The polyp       was removed with a cold biopsy forceps. Resection and retrieval were       complete.      A 5 mm polyp was found in the rectum. The polyp was sessile. The polyp       was removed with a cold snare. Resection and retrieval were complete.      A 6 mm polyp was found in the rectum. The polyp was sessile. The polyp       was removed with a hot snare. Resection and retrieval were complete.      The recto-sigmoid colon and sigmoid colon were moderately redundant.      Internal hemorrhoids were found during retroflexion. The hemorrhoids       were large. Impression:               - One 4 mm polyp in the rectum, removed with a cold                            biopsy forceps. Resected  and retrieved.                           - One 5 mm polyp in the rectum, removed with a cold                            snare. Resected and retrieved.                           - One 6 mm polyp in the rectum, removed with a hot                            snare. Resected and retrieved.                           - Redundant colon.                           - Internal hemorrhoids. Moderate Sedation:      Moderate (conscious) sedation was administered by the endoscopy nurse       and supervised by the endoscopist. The following parameters were       monitored: oxygen saturation, heart rate, blood pressure, and response       to care. Total physician intraservice time was 53 minutes. Recommendation:           - Repeat colonoscopy in 5-10 years for surveillance.                           - High fiber diet.                           - Continue present medications.                           - Await pathology results.                           -  Patient has a contact number available for                            emergencies. The signs and symptoms of potential                            delayed complications were discussed with the                            patient. Return to normal activities tomorrow.                            Written discharge instructions were provided to the                            patient. Procedure Code(s):        --- Professional ---                           [phone], Colonoscopy, flexible; with removal of                            tumor(s), polyp(s), or other lesion(s) by snare                            technique                           [id number], 59, Colonoscopy, flexible; with biopsy,                            single or multiple                           99152, Moderate sedation services provided by the                            same physician or other qualified health care                            professional performing the diagnostic or                             therapeutic service that the sedation supports,                            requiring the presence of an independent trained                            observer to assist in the monitoring of the                            patient's level of consciousness and physiological                            status;  initial 15 minutes of intraservice time,                            patient age 51 years or older                           [phone], Moderate sedation services; each additional                            15 minutes intraservice time                           [phone], Moderate sedation services; each additional                            15 minutes intraservice time                           99153, Moderate sedation services; each additional                            15 minutes intraservice time Diagnosis Code(s):        --- Professional ---                           Z12.11, Encounter for screening for malignant                            neoplasm of colon                           K62.1, Rectal polyp                           K64.8, Other hemorrhoids                           Q43.8, Other specified congenital malformations of                            intestine CPT copyright 2016 American Medical Association. All rights reserved. The codes documented in this report are preliminary and upon coder review may  be revised to meet current compliance requirements. Barney Drain, MD Barney Drain MD, MD 12/18/2016 6:49:17 PM This report has been signed electronically. Number of Addenda: 0

## 2016-12-21 DIAGNOSIS — K219 Gastro-esophageal reflux disease without esophagitis: Secondary | ICD-10-CM

## 2016-12-22 ENCOUNTER — Encounter: Payer: Self-pay | Admitting: "Endocrinology

## 2016-12-22 ENCOUNTER — Ambulatory Visit: Payer: 59 | Admitting: "Endocrinology

## 2016-12-22 ENCOUNTER — Encounter (HOSPITAL_COMMUNITY): Payer: Self-pay | Admitting: Gastroenterology

## 2016-12-24 ENCOUNTER — Ambulatory Visit (INDEPENDENT_AMBULATORY_CARE_PROVIDER_SITE_OTHER): Payer: Self-pay | Admitting: Family Medicine

## 2016-12-24 ENCOUNTER — Encounter: Payer: Self-pay | Admitting: Family Medicine

## 2016-12-24 VITALS — BP 130/70 | HR 76 | Temp 97.1°F | Resp 16 | Ht 64.0 in | Wt 223.2 lb

## 2016-12-24 DIAGNOSIS — Z0000 Encounter for general adult medical examination without abnormal findings: Secondary | ICD-10-CM

## 2016-12-24 DIAGNOSIS — Z23 Encounter for immunization: Secondary | ICD-10-CM

## 2016-12-24 DIAGNOSIS — E782 Mixed hyperlipidemia: Secondary | ICD-10-CM

## 2016-12-24 LAB — LIPID PANEL
CHOL/HDL RATIO: 6.3 (calc) — AB (ref <5.0)
Cholesterol: 334 mg/dL — ABNORMAL HIGH (ref <200)
HDL: 53 mg/dL (ref >50)
NON-HDL CHOLESTEROL (CALC): 281 mg/dL — AB (ref <130)
Triglycerides: 1350 mg/dL — ABNORMAL HIGH (ref <150)

## 2016-12-24 LAB — TSH: TSH: 4.16 mIU/L (ref 0.40–4.50)

## 2016-12-24 MED ORDER — AZELASTINE HCL 0.1 % NA SOLN: 2.0000 | Freq: Two times a day (BID) | NASAL | 2 refills | Status: DC

## 2016-12-24 MED ORDER — INSULIN DETEMIR 100 UNIT/ML FLEXPEN: 100.0000 [IU] | PEN_INJECTOR | Freq: Every day | SUBCUTANEOUS | 11 refills | Status: DC

## 2016-12-24 NOTE — Patient Instructions (Addendum)
F/u in 4  Month, call if you need me before  Shingrix today  Please take script for the Estrella Deeds printed to your local pharmacy today with you  New additional allergy nasal spray Astelin twice daily  Continue with specialist appointments   Lipid, TSH today   .e1 Please work on good  health habits so that your health will improve. 1. Commitment to daily physical activity for 30 to 60  minutes, if you are able to do this.  2. Commitment to wise food choices. Aim for half of your  food intake to be vegetable and fruit, one quarter starchy foods, and one quarter protein. Try to eat on a regular schedule  3 meals per day, snacking between meals should be limited to vegetables or fruits or small portions of nuts. 64 ounces of water per day is generally recommended, unless you have specific health conditions, like heart failure or kidney failure where you will need to limit fluid intake.  3. Commitment to sufficient and a  good quality of physical and mental rest daily, generally between 6 to 8 hours per day.  WITH PERSISTANCE AND PERSEVERANCE, THE IMPOSSIBLE , BECOMES THE NORM! Thank you  for choosing [facility]. We consider it a privelige to serve you.  Delivering excellent health care in a caring and  compassionate way is our goal.  Partnering with you,  so that together we can achieve this goal is our strategy.

## 2016-12-28 ENCOUNTER — Other Ambulatory Visit: Payer: Self-pay | Admitting: Family Medicine

## 2016-12-28 ENCOUNTER — Telehealth: Payer: Self-pay | Admitting: Family Medicine

## 2016-12-28 DIAGNOSIS — E782 Mixed hyperlipidemia: Secondary | ICD-10-CM

## 2016-12-28 MED ORDER — ATORVASTATIN CALCIUM 20 MG PO TABS: 20.0000 mg | ORAL_TABLET | Freq: Every day | ORAL | 3 refills | Status: DC

## 2016-12-28 NOTE — Telephone Encounter (Signed)
-----   Message from Fayrene Helper, MD sent at 12/28/2016  6:10 AM EDT ----- Your cholesterol and triglyceride levels are EXTREMELY high, please discontinue all fried and fatty foods, butter, cheese, and I recommend trying a low dose cholesterol medication again , please let me know if you agree , so I can prescribe lipitor 20 mg daily

## 2016-12-28 NOTE — Addendum Note (Signed)
Addended by: Merceda Elks on: 12/28/2016 12:52 PM   Modules accepted: Orders

## 2016-12-28 NOTE — Telephone Encounter (Signed)
Please mail fasting lipid and hepatic to be drawn 1 week before her Feb follow up appt with me, I discussed with her

## 2016-12-28 NOTE — Telephone Encounter (Signed)
Patient informed of message below, verbalized understanding. Agreeable to medication

## 2016-12-30 ENCOUNTER — Telehealth: Payer: Self-pay | Admitting: Gastroenterology

## 2016-12-30 NOTE — Telephone Encounter (Signed)
Please call pt. She had THREE simple adenomas removed. HER BRAVO STUDY SHOWS OMEPRAZOLE DOES NOT CONTROL HER ACID REFLUX. HER DIFFICULTY SWALLOWING IS MOST LIKELY DUE TO UNCONTROLLED REFLUX.  SHE NEEDS TO AVOID TRIGGERS FOR REFLUX. IF YOUR DIABETES IS OUT OF CONTROL YOU WILL HAVE TROUBLE CONTROLLING YOUR REFLUX BECAUSE YOUR STOMACH WILL NOT EMPTY QUICKLY. CONTINUE DEXILANT.  FOLLOW A LOW FAT/DIABETIC DIET.  CONTINUE YOUR WEIGHT LOSS EFFORTS.  FOLLOW UP IN 4 MOS E30 GERD/DYSPHAGIA.  NEXT COLONOSCOPY IN 3 YEARS.

## 2016-12-30 NOTE — Discharge Instructions (Signed)
I PLACED A CAPSULE IN YOUR ESOPHAGUS.  IT WILL FALL OFF & PASS IN YOUR STOOL. You have mild gastritis & A SMALL HIATAL HERNIA. YOU HAD 3 POLYPS REMOVED. YOU HAVE LARGE INTERNAL HEMORRHOIDS.   IF YOUR DIABETES IS OUT OF CONTROL YOU WILL HAVE TROUBLE CONTROLLING YOUR REFLUX BECAUSE YOUR STOMACH WILL NOT EMPTY QUICKLY.  TAKE YOUR omeprazole tonight and a dose in the morning SEP 29 AND EVENING SEP 29. Pick up DEXILANT ON SEP 29 and start on SUN morning SEP 30.   RECORD EVERYTHING THAT GOES INTO YOUR MOUTH FOR THE NEXT 48 HOURS.  RETURN THE RECORDER ON  MON OCT 1.  FOLLOW A LOW FAT/DIABETIC DIET. SEE INFO BELOW.  CONTINUE YOUR WEIGHT LOSS EFFORTS.  WHILE I DO NOT WANT TO ALARM YOU, YOUR BODY MASS INDEX IS OVER 30 WHICH MEANS YOU ARE OBESE. OBESITY ACTIVATES CANCER GENES. OBESITY IS ASSOCIATED WITH AN INCREASED RISK FOR CIRRHOSIS AND FOR ALL CANCERS, INCLUDING ESOPHAGEAL AND COLON CANCER. A WEIGHT OF 165-170 LBS  WILL GET YOUR BODY MASS INDEX(BMI) UNDER 30.  YOUR BIOPSY RESULTS WILL BE AVAILABLE IN MY CHART AFTER  OCT 2 AND MY OFFICE WILL CONTACT YOU IN 10-14 DAYS WITH YOUR RESULTS.    FOLLOW UP IN 4 MOS.  NEXT COLONOSCOPY IN 5-10 YEARS.    ENDOSCOPY Care After Read the instructions outlined below and refer to this sheet in the next week. These discharge instructions provide you with general information on caring for yourself after you leave the hospital. While your treatment has been planned according to the most current medical practices available, unavoidable complications occasionally occur. If you have any problems or questions after discharge, call DR. Norvil Martensen, [phone].  ACTIVITY  You may resume your regular activity, but move at a slower pace for the next 24 hours.   Take frequent rest periods for the next 24 hours.   Walking will help get rid of the air and reduce the bloated feeling in your belly (abdomen).   No driving for 24 hours (because of the medicine (anesthesia)  used during the test).   You may shower.   Do not sign any important legal documents or operate any machinery for 24 hours (because of the anesthesia used during the test).    NUTRITION  Drink plenty of fluids.   You may resume your normal diet as instructed by your doctor.   Begin with a light meal and progress to your normal diet. Heavy or fried foods are harder to digest and may make you feel sick to your stomach (nauseated).   Avoid alcoholic beverages for 24 hours or as instructed.    MEDICATIONS  You may resume your normal medications.   WHAT YOU CAN EXPECT TODAY  Some feelings of bloating in the abdomen.   Passage of more gas than usual.   Spotting of blood in your stool or on the toilet paper  .  IF YOU HAD POLYPS REMOVED DURING THE ENDOSCOPY:  Eat a soft diet IF YOU HAVE NAUSEA, BLOATING, ABDOMINAL PAIN, OR VOMITING.    FINDING OUT THE RESULTS OF YOUR TEST Not all test results are available during your visit. DR. Oneida Alar WILL CALL YOU WITHIN 14 DAYS OF YOUR PROCEDUE WITH YOUR RESULTS. Do not assume everything is normal if you have not heard from DR. Caid Radin IN ONE WEEK, CALL HER OFFICE AT [phone].  SEEK IMMEDIATE MEDICAL ATTENTION AND CALL THE OFFICE: [phone] IF:  You have more than a spotting of blood in  your stool.   Your belly is swollen (abdominal distention).   You are nauseated or vomiting.   You have a temperature over 101F.   You have abdominal pain or discomfort that is severe or gets worse throughout the day.   Lifestyle and home remedies TO CONTROL REFLUX AND HEARTBURN  You may eliminate or reduce the frequency of heartburn by making the following lifestyle changes:  Control your weight. Being overweight is a major risk factor for heartburn and GERD. Excess pounds put pressure on your abdomen, pushing up your stomach and causing acid to back up into your esophagus.   Eat smaller meals. 4 TO 6 MEALS A DAY. This reduces pressure on  the lower esophageal sphincter, helping to prevent the valve from opening and acid from washing back into your esophagus.   Loosen your belt. Clothes that fit tightly around your waist put pressure on your abdomen and the lower esophageal sphincter.   Eliminate heartburn triggers. Everyone has specific triggers. Common triggers such as fatty or fried foods, spicy food, tomato sauce, carbonated beverages, alcohol, chocolate, mint, garlic, onion, caffeine and nicotine may make heartburn worse.   Avoid stooping or bending. Tying your shoes is OK. Bending over for longer periods to weed your garden isn't, especially soon after eating.   Don't lie down after a meal. Wait at least three to four hours after eating before going to bed, and don't lie down right after eating.   WHEN YOU SLEEP KEEP YOUR HEAD ABOVE YOUR HEART BY PUTTING THE HEAD OF YOUR BED ON 6 INCH BLOCKS OR BY SLEEPING ON A WEDGE.  Alternative medicine  Several home remedies exist for treating GERD, but they provide only temporary relief. They include drinking baking soda (sodium bicarbonate) added to water or drinking other fluids such as baking soda mixed with cream of tartar and water.  Although these liquids create temporary relief by neutralizing, washing away or buffering acids, eventually they aggravate the situation by adding gas and fluid to your stomach, increasing pressure and causing more acid reflux. Further, adding more sodium to your diet may increase your blood pressure and add stress to your heart, and excessive bicarbonate ingestion can alter the acid-base balance in your body.  Low-Fat Diet BREADS, CEREALS, PASTA, RICE, DRIED PEAS, AND BEANS These products are high in carbohydrates and most are low in fat. Therefore, they can be increased in the diet as substitutes for fatty foods. They too, however, contain calories and should not be eaten in excess. Cereals can be eaten for snacks as well as for breakfast.  Include  foods that contain fiber (fruits, vegetables, whole grains, and legumes). Research shows that fiber may lower blood cholesterol levels, especially the water-soluble fiber found in fruits, vegetables, oat products, and legumes.  FRUITS AND VEGETABLES It is good to eat fruits and vegetables. Besides being sources of fiber, both are rich in vitamins and some minerals. They help you get the daily allowances of these nutrients. Fruits and vegetables can be used for snacks and desserts.  MEATS Limit lean meat, chicken, [place], and fish to no more than 6 ounces per day.  Beef, Pork, and Lamb Use lean cuts of beef, pork, and lamb. Lean cuts include:  Extra-lean ground beef.  Arm roast.  Sirloin tip.  Center-cut ham.  Round steak.  Loin chops.  Rump roast.  Tenderloin.  Trim all fat off the outside of meats before cooking. It is not necessary to severely decrease the intake of red  meat, but lean choices should be made. Lean meat is rich in protein and contains a highly absorbable form of iron. Premenopausal women, in particular, should avoid reducing lean red meat because this could increase the risk for low red blood cells (iron-deficiency anemia).  Chicken and [place] These are good sources of protein. The fat of poultry can be reduced by removing the skin and underlying fat layers before cooking. Chicken and [place] can be substituted for lean red meat in the diet. Poultry should not be fried or covered with high-fat sauces.  Fish and Shellfish Fish is a good source of protein. Shellfish contain cholesterol, but they usually are low in saturated fatty acids. The preparation of fish is important. Like chicken and [place], they should not be fried or covered with high-fat sauces.  EGGS Egg whites contain no fat or cholesterol. They can be eaten often. Try 1 to 2 egg whites instead of whole eggs in recipes or use egg substitutes that do not contain yolk.  MILK AND DAIRY PRODUCTS Use skim or 1% milk  instead of 2% or whole milk. Decrease whole milk, natural, and processed cheeses. Use nonfat or low-fat (2%) cottage cheese or low-fat cheeses made from vegetable oils. Choose nonfat or low-fat (1 to 2%) yogurt. Experiment with evaporated skim milk in recipes that call for heavy cream. Substitute low-fat yogurt or low-fat cottage cheese for sour cream in dips and salad dressings. Have at least 2 servings of low-fat dairy products, such as 2 glasses of skim (or 1%) milk each day to help get your daily calcium intake.  FATS AND OILS Reduce the total intake of fats, especially saturated fat. Butterfat, lard, and beef fats are high in saturated fat and cholesterol. These should be avoided as much as possible. Vegetable fats do not contain cholesterol, but certain vegetable fats, such as coconut oil, palm oil, and palm kernel oil are very high in saturated fats. These should be limited. These fats are often used in bakery goods, processed foods, popcorn, oils, and nondairy creamers. Vegetable shortenings and some peanut butters contain hydrogenated oils, which are also saturated fats. Read the labels on these foods and check for saturated vegetable oils.  Unsaturated vegetable oils and fats do not raise blood cholesterol. However, they should be limited because they are fats and are high in calories. Total fat should still be limited to 30% of your daily caloric intake. Desirable liquid vegetable oils are corn oil, cottonseed oil, olive oil, canola oil, safflower oil, soybean oil, and sunflower oil. Peanut oil is not as good, but small amounts are acceptable. Buy a heart-healthy tub margarine that has no partially hydrogenated oils in the ingredients. Mayonnaise and salad dressings often are made from unsaturated fats, but they should also be limited because of their high calorie and fat content.  OTHER EATING TIPS Snacks  Most sweets should be limited as snacks. They tend to be rich in calories and fats, and  their caloric content outweighs their nutritional value. Some good choices in snacks are graham crackers, melba toast, soda crackers, bagels (no egg), English muffins, fruits, and vegetables. These snacks are preferable to snack crackers, [place] fries, and chips. Popcorn should be air-popped or cooked in small amounts of liquid vegetable oil.  Desserts Eat fruit, low-fat yogurt, and fruit ices instead of pastries, cake, and cookies. Sherbet, angel food cake, gelatin dessert, frozen low-fat yogurt, or other frozen products that do not contain saturated fat (pure fruit juice bars, frozen ice pops) are  also acceptable.   COOKING METHODS Choose those methods that use little or no fat. They include: Poaching.  Braising.  Steaming.  Grilling.  Baking.  Stir-frying.  Broiling.  Microwaving.  Foods can be cooked in a nonstick pan without added fat, or use a nonfat cooking spray in regular cookware. Limit fried foods and avoid frying in saturated fat. Add moisture to lean meats by using water, broth, cooking wines, and other nonfat or low-fat sauces along with the cooking methods mentioned above. Soups and stews should be chilled after cooking. The fat that forms on top after a few hours in the refrigerator should be skimmed off. When preparing meals, avoid using excess salt. Salt can contribute to raising blood pressure in some people . EATING AWAY FROM HOME Order entres, potatoes, and vegetables without sauces or butter. When meat exceeds the size of a deck of cards (3 to 4 ounces), the rest can be taken home for another meal.  Choose vegetable or fruit salads and ask for low-calorie salad dressings to be served on the side. Use dressings sparingly. Limit high-fat toppings, such as bacon, crumbled eggs, cheese, sunflower seeds, and olives. Ask for heart-healthy tub margarine instead of butter.

## 2016-12-31 NOTE — Telephone Encounter (Signed)
Reminder in epic °

## 2017-01-03 ENCOUNTER — Encounter: Payer: Self-pay | Admitting: Family Medicine

## 2017-01-03 DIAGNOSIS — Z23 Encounter for immunization: Secondary | ICD-10-CM | POA: Insufficient documentation

## 2017-01-03 NOTE — Assessment & Plan Note (Signed)
After obtaining informed consent, the vaccine is  administered by LPN.  

## 2017-01-03 NOTE — Progress Notes (Signed)
    Kathryn Beck     MRN: [id number]      DOB: [birth year]  HPI: Patient is in for annual physical exam. No other health concerns are expressed or addressed at the visit. Recent labs, if available are reviewed. Immunization is reviewed , and  updated if needed.   PE: Pleasant  female, alert and oriented x 3, in no cardio-pulmonary distress. Afebrile. HEENT No facial trauma or asymetry. Sinuses non tender.  Extra occullar muscles intact, pupils equally reactive to light. External ears normal, tympanic membranes clear. Oropharynx moist, no exudate. Neck: supple, no adenopathy,JVD or thyromegaly.No bruits.  Chest: Clear to ascultation bilaterally.No crackles or wheezes. Non tender to palpation  Breast: No asymetry,no masses or lumps. No tenderness. No nipple discharge or inversion. No axillary or supraclavicular adenopathy  Cardiovascular system; Heart sounds normal,  S1 and  S2 ,no S3.  No murmur, or thrill. Apical beat not displaced Peripheral pulses normal.  Abdomen: Soft, non tender, no organomegaly or masses. No bruits. Bowel sounds normal. No guarding, tenderness or rebound.  Rectal:  Not examined due to recent colonoscopy  GU: Not examined , asymptomatic  Musculoskeletal exam: Full ROM of spine, hips , shoulders and knees. No deformity ,swelling or crepitus noted. No muscle wasting or atrophy.   Neurologic: Cranial nerves 2 to 12 intact. Power, tone ,sensation and reflexes normal throughout. No disturbance in gait. No tremor.  Skin: Intact, no ulceration, erythema , scaling or rash noted. Pigmentation normal throughout  Psych; Normal mood and affect. Judgement and concentration normal   Assessment & Plan:  Annual physical exam Annual exam as documented. Counseling done  re healthy lifestyle involving commitment to 150 minutes exercise per week, heart healthy diet, and attaining healthy weight.The importance of adequate sleep also  discussed.  Immunization and cancer screening needs are specifically addressed at this visit.   Need for shingles vaccine After obtaining informed consent, the vaccine is  administered by LPN.

## 2017-01-03 NOTE — Assessment & Plan Note (Signed)
Annual exam as documented. Counseling done  re healthy lifestyle involving commitment to 150 minutes exercise per week, heart healthy diet, and attaining healthy weight.The importance of adequate sleep also discussed.  Immunization and cancer screening needs are specifically addressed at this visit.  

## 2017-01-04 NOTE — Telephone Encounter (Signed)
LMOM to call.

## 2017-01-04 NOTE — Telephone Encounter (Signed)
Pt is aware and said the Dexilant cost her a little over $100.00. She still has enough for about 2 weeks but would like to get something cheaper.  Please advise!

## 2017-01-05 MED ORDER — PANTOPRAZOLE SODIUM 40 MG PO TBEC: DELAYED_RELEASE_TABLET | ORAL | 11 refills | Status: DC

## 2017-01-05 NOTE — Telephone Encounter (Signed)
Pt is aware.  

## 2017-01-05 NOTE — Telephone Encounter (Signed)
PLEASE CALL PT. SHE CAN TRY PROTONIX BID. AVOID REFLUX TRIGGERS.

## 2017-01-05 NOTE — Addendum Note (Signed)
Addended by: Danie Binder on: 01/05/2017 02:32 PM   Modules accepted: Orders

## 2017-01-11 ENCOUNTER — Telehealth: Payer: Self-pay | Admitting: *Deleted

## 2017-01-11 NOTE — Telephone Encounter (Signed)
Fax received from [facility]: Alternative requested: pt paying $110 for dexilant DR 60 mg capsule. Pt requesting lower out of pocket cost. Alternatives: Omeprazole 20mg  - $30, Pantoprazol40mg  - $40

## 2017-01-12 NOTE — Telephone Encounter (Signed)
Called spoke with patient. She received pantoprazole 40 mg in the mail. Looking back in the chart this was sent in on 01/05/17 by Dr. Oneida Alar. Patient is going to try the pant 40 mg and see how this works. Nothing further needed.

## 2017-01-12 NOTE — Telephone Encounter (Signed)
It appears she's previously tried omeprazole and pantoprazole. She was likely put on Dexilant because they're not effective. Does she have commercial insurance or Medicare/Medicaid/VA, etc? If commercial she can pickup a copay card.   I can send in for one of the alternatives, but again, she's tried them. Let me know if she wants it sent in and which one she prefers.

## 2017-01-18 ENCOUNTER — Encounter: Payer: 59 | Attending: Surgery | Admitting: Registered"

## 2017-01-18 DIAGNOSIS — E669 Obesity, unspecified: Secondary | ICD-10-CM

## 2017-01-18 DIAGNOSIS — Z6836 Body mass index (BMI) 36.0-36.9, adult: Secondary | ICD-10-CM | POA: Diagnosis not present

## 2017-01-19 NOTE — Progress Notes (Signed)
  Pre-Operative Nutrition Class:  Appt start time: 8:15   End time:  9:15.  Patient was seen on 01/18/2017 for Pre-Operative Bariatric Surgery Education at [facility].   Surgery date: TBD Surgery type: RYGB Start weight at [facility]: 213.8 Weight today: 206.1   Samples given per MNT protocol. Patient educated on appropriate usage: Bariatric Advantage Multivitamin Lot # [id number] Exp: 09/2017  Bariatric Advantage Calcium Citrate (chocolate) Lot # [id number] Exp: 12/24/2017  Bariatric Advantage Calcium Citrate (lemon) Lot # [id number] Exp: 12/03/2017  Renee Pain Protein Shake Lot # [id number] Exp: 08/15/2017   The following the learning objectives were met by the patient during this course:  Identify Pre-Op Dietary Goals and will begin 2 weeks pre-operatively  Identify appropriate sources of fluids and proteins   State protein recommendations and appropriate sources pre and post-operatively  Identify Post-Operative Dietary Goals and will follow for 2 weeks post-operatively  Identify appropriate multivitamin and calcium sources  Describe the need for physical activity post-operatively and will follow MD recommendations  State when to call healthcare provider regarding medication questions or post-operative complications  Handouts given during class include:  Pre-Op Bariatric Surgery Diet Handout  Protein Shake Handout  Post-Op Bariatric Surgery Nutrition Handout  BELT Program Information Flyer  Support Group Information Flyer  [facility] Bariatric Supplements Price List  Follow-Up Plan: Patient will follow-up at [facility] 2 weeks post operatively for diet advancement per MD.

## 2017-01-27 ENCOUNTER — Encounter: Payer: Self-pay | Admitting: Internal Medicine

## 2017-01-27 ENCOUNTER — Ambulatory Visit (INDEPENDENT_AMBULATORY_CARE_PROVIDER_SITE_OTHER): Payer: 59 | Admitting: Internal Medicine

## 2017-01-27 VITALS — BP 142/82 | HR 108 | Wt 200.0 lb

## 2017-01-27 DIAGNOSIS — Z794 Long term (current) use of insulin: Secondary | ICD-10-CM

## 2017-01-27 DIAGNOSIS — N184 Chronic kidney disease, stage 4 (severe): Secondary | ICD-10-CM | POA: Diagnosis not present

## 2017-01-27 DIAGNOSIS — E1122 Type 2 diabetes mellitus with diabetic chronic kidney disease: Secondary | ICD-10-CM | POA: Diagnosis not present

## 2017-01-27 MED ORDER — INSULIN PEN NEEDLE 31G X 5 MM MISC: 5 refills | Status: DC

## 2017-01-27 MED ORDER — INSULIN LISPRO 200 UNIT/ML ~~LOC~~ SOPN: 20.0000 [IU] | PEN_INJECTOR | Freq: Three times a day (TID) | SUBCUTANEOUS | 5 refills | Status: DC

## 2017-01-27 NOTE — Progress Notes (Signed)
Patient ID: Kathryn Beck, female   DOB: 06/09/1955, 61 y.o.   MRN: [id number]  HPI: Kathryn Beck is a 61 y.o.-year-old female, referred by her PCP, Dr. Moshe Cipro, for management of DM2, dx in 2008, insulin-dependent since ~2015, uncontrolled, with complications (CKD stage 4, PN). She daw Dr. Dorris Fetch - last OV with him 05/2016.  Last hemoglobin A1c was: Lab Results  Component Value Date   HGBA1C 12.5 (H) 11/25/2016   HGBA1C 11.1 (H) 06/09/2016   HGBA1C 11.2 (H) 02/28/2016   Pt is on a regimen of: - Humalog 75/25 20-26 units before each meal, 2-3X a day depending on the CBG before the meal  - Levemir 100 units at bedtime  Pt checks her sugars 3-4 a day and they are: - am: 200-300, HI - 2h after b'fast: n/c - before lunch: 200s - 2h after lunch: n/c - before dinner: 200-300 - 2h after dinner: n/c - bedtime: 200-300s - nighttime: n/c Lowest sugar was 150; ? hypoglycemia awareness. Highest sugar was HI.  Glucometer: Accuchek  She is obese >> seeing nutrition, in preparation for R en Y GBP later this year. She lost 23 lbs in last month  - eliminated fluid.  Pt's meals are: - Breakfast: eggs, bacon, oatmeal, grits - Lunch: salads - Dinner: meat + veggie or sandwich + salad or nabs - Snacks: nabs, fruit cups, jello  - + stage 4 CKD (Dr. Juleen China), last BUN/creatinine:  Lab Results  Component Value Date   BUN 39 (H) 11/25/2016   BUN 32 (H) 11/16/2016   CREATININE 2.63 (H) 11/25/2016   CREATININE 2.39 (H) 11/16/2016  On Olmesartan 40. - + HTG; last set of lipids: Lab Results  Component Value Date   CHOL 334 (H) 12/24/2016   HDL 53 12/24/2016   LDLCALC NOT CALC 05/19/2016   LDLDIRECT 107 05/19/2016   TRIG 1,350 (H) 12/24/2016   CHOLHDL 6.3 (H) 12/24/2016  On Zetia 10, was on Crestor 5 >>  Now Lipitor 40. - last eye exam was on 01/13/2016 >> No DR.  - no numbness but + tingling in her feet.  Pt has FH of DM in sister.  She also has HTN.  ROS: Constitutional: no  weight gain/loss, no fatigue, no subjective hyperthermia/hypothermia Eyes: no blurry vision, no xerophthalmia ENT: no sore throat, no nodules palpated in throat, no dysphagia/odynophagia, no hoarseness Cardiovascular: no CP/SOB/palpitations/+ leg swelling (now resolved) Respiratory: no cough/SOB Gastrointestinal: + N/no V/+ D/no C Musculoskeletal: no muscle/joint aches Skin: no rashes Neurological: no tremors/numbness/tingling/dizziness Psychiatric: no depression/anxiety  Past Medical History:  Diagnosis Date  . Anemia   . Axillary masses    Soft tissue - status post excision  . Back pain   . Chronic kidney disease   . Essential hypertension   . Ischemic heart disease    Abnormal Myoview April 2018 - medical therapy  . Mixed hyperlipidemia   . Obesity   . Sleep apnea    Noncompliant with CPAP  . Type 2 diabetes mellitus, uncontrolled ([place])    Past Surgical History:  Procedure Laterality Date  . ABDOMINAL HYSTERECTOMY    . COLONOSCOPY  2008   Dr. Oneida Alar: normal   . FOOT SURGERY Bilateral   . LUNG BIOPSY     Social History   Socioeconomic History  . Marital status: Married    Spouse name: Not on file  . Number of children: 2  Occupational History  . CNA  Tobacco Use  . Smoking status: Never Smoker  .  Smokeless tobacco: Never Used  Substance and Sexual Activity  . Alcohol use: No  . Drug use: No   Current Outpatient Medications on File Prior to Visit  Medication Sig Dispense Refill  . ACCU-CHEK FASTCLIX LANCETS MISC USE AS INSTRUCTED THREE  TIMES DAILY 306 each 5  . acetaminophen (TYLENOL) 500 MG tablet Take 1,000 mg by mouth every 6 (six) hours as needed for mild pain, moderate pain or headache.    . allopurinol (ZYLOPRIM) 300 MG tablet TAKE 1 TABLET (300 MG TOTAL) BY MOUTH DAILY. 30 tablet 3  . amLODipine (NORVASC) 10 MG tablet TAKE 1 TABLET BY MOUTH  DAILY 90 tablet 1  . atorvastatin (LIPITOR) 20 MG tablet Take 1 tablet (20 mg total) by mouth daily. 90 tablet 3   . azelastine (ASTELIN) 0.1 % nasal spray Place 2 sprays into both nostrils 2 (two) times daily. Use in each nostril as directed 30 mL 2  . B-D UF III MINI PEN NEEDLES 31G X 5 MM MISC USE AS INSTRUCTED THREE  TIMES DAILY 200 each 0  . calcitRIOL (ROCALTROL) 0.25 MCG capsule One capsule three times weekly ( Monday, Wednesday, Friday) 36 capsule 0  . ciprofloxacin-dexamethasone (CIPRODEX) OTIC suspension Place 4 drops into both ears 2 (two) times daily.    . cloNIDine (CATAPRES) 0.1 MG tablet TAKE 1 TABLET BY MOUTH  EVERY EVENING AT 9PM FOR  BLOOD PRESSURE 90 tablet 1  . cyclobenzaprine (FLEXERIL) 10 MG tablet Take 1 tablet (10 mg total) by mouth at bedtime. 30 tablet 3  . dexlansoprazole (DEXILANT) 60 MG capsule 1 PO EVERY MORNING WITH BREAKFAST. 30 capsule 11  . dicyclomine (BENTYL) 10 MG capsule 1 po qachs if needed to prevent diarrhea and abdominal cramps 120 capsule 3  . ezetimibe (ZETIA) 10 MG tablet Take 1 tablet (10 mg total) by mouth daily. 30 tablet 5  . furosemide (LASIX) 40 MG tablet Take 1 tablet (40 mg total) by mouth daily. 7 tablet 0  . glucose blood (ACCU-CHEK AVIVA PLUS) test strip USE 4 TIMES DAILY E11.65 300 each 2  . Insulin Detemir (LEVEMIR) 100 UNIT/ML Pen Inject 100 Units into the skin daily at 10 pm. 30 mL 11  . Insulin Lispro Prot & Lispro (HUMALOG MIX 75/25 KWIKPEN) (75-25) 100 UNIT/ML Kwikpen Inject [place] 20 to 26 units before each meal per sliding scale coverage 15 mL 11  . labetalol (NORMODYNE) 200 MG tablet Take 2 tablets (400 mg total) by mouth 2 (two) times daily. 360 tablet 3  . montelukast (SINGULAIR) 10 MG tablet Take 10 mg by mouth at bedtime.    Marland Kitchen olmesartan (BENICAR) 40 MG tablet Take 1 tablet (40 mg total) by mouth daily. 90 tablet 1  . ondansetron (ZOFRAN) 4 MG tablet Take 1 tablet (4 mg total) by mouth every 8 (eight) hours as needed for nausea or vomiting. 30 tablet 1  . pantoprazole (PROTONIX) 40 MG tablet 1 PO 30 MINUTES PRIOR TO MEALS BID 60 tablet 11  .  rosuvastatin (CRESTOR) 5 MG tablet TAKE 1 TABLET (5 MG TOTAL) BY MOUTH DAILY. 30 tablet 3  . spironolactone (ALDACTONE) 25 MG tablet Take 1 tablet (25 mg total) by mouth 2 (two) times daily. 180 tablet 1  . triamterene-hydrochlorothiazide (MAXZIDE) 75-50 MG tablet Take 1 tablet by mouth daily. 90 tablet 1  . Vitamin D, Ergocalciferol, (DRISDOL) 50000 units CAPS capsule TAKE 1 CAPSULE (50,000 UNITS TOTAL) BY MOUTH EVERY 7 (SEVEN) DAYS. (Patient taking differently: Take 50,000 Units by mouth every  7 (seven) days. Takes on Monday) 4 capsule 0  . [DISCONTINUED] FLUoxetine (PROZAC) 10 MG capsule Take 10 mg by mouth daily.      . [DISCONTINUED] glipiZIDE (GLUCOTROL) 10 MG tablet Take 10 mg by mouth 2 (two) times daily before a meal.       No current facility-administered medications on file prior to visit.    Allergies  Allergen Reactions  . Ace Inhibitors Anaphylaxis and Swelling  . Penicillins Itching and Swelling    Has patient had a PCN reaction causing immediate rash, facial/tongue/throat swelling, SOB or lightheadedness with hypotension: Yes Has patient had a PCN reaction causing severe rash involving mucus membranes or skin necrosis: No Has patient had a PCN reaction that required hospitalization No Has patient had a PCN reaction occurring within the last 10 years: No If all of the above answers are "NO", then may proceed with Cephalosporin use.   . Statins Other (See Comments)    elevated LFT's   Family History  Problem Relation Age of Onset  . Hypertension Father   . Hypercholesterolemia Father   . Arthritis Father   . Hypertension Sister   . Hypercholesterolemia Sister   . Breast cancer Sister   . Hypertension Sister   . Colon cancer Neg Hx   . Colon polyps Neg Hx     PE: BP (!) 142/82 (BP Location: Left Arm, Patient Position: Sitting)   Pulse (!) 108   Wt 200 lb (90.7 kg)   SpO2 95%   BMI 34.33 kg/m  Wt Readings from Last 3 Encounters:  01/27/17 200 lb (90.7 kg)   01/19/17 206 lb 1.6 oz (93.5 kg)  12/24/16 223 lb 4 oz (101.3 kg)   Constitutional: obese, in NAD Eyes: PERRLA, EOMI, no exophthalmos ENT: moist mucous membranes, no thyromegaly, no cervical lymphadenopathy Cardiovascular: tachycardia, RR, No MRG Respiratory: CTA B Gastrointestinal: abdomen soft, NT, ND, BS+ Musculoskeletal: no deformities, strength intact in all 4 Skin: moist, warm, no rashes Neurological: no tremor with outstretched hands, DTR normal in all 4  ASSESSMENT: 1. DM2, insulin-dependent, uncontrolled, with complications - CKD stage 4 - PN  PLAN:  1. Patient with long-standing, uncontrolled diabetes, on premixed insulin + long acting insulin regimen, which became insufficient. Her sugars are very high and latest HbA1c 1.5 mo ago: 12.5%, also very high. - we discussed about the concept of insulin resistance And I made specific suggestions about improving her diet. - We also discussed that she is on a very large amount of long-acting insulin, both from Levemir and the 75/25 premixed insulin, but not enough mealtime insulin.  At this visit, we will stop the 75/25 insulin and switch to Humalog insulin before each meal.  I gave her a flexible regimen depending on the size of her meal.   - We also discussed about changing the way that she is injecting Levemir at night.  But she keeps the pen in while dialing up the dose and I advised her to separate the dose in 2 injections. - We also discussed about CBG management in situations in which she does not eat as she is not hungry or feels nauseated - I explained that this is only a starting point, and we need to adjust the doses based on how she responds to the regimen.  I encouraged her to contact me with her sugars through my chart. - I suggested to:  Patient Instructions  Please split Levemir in 50 units x 2 injections at bedtime.  Stop 75/25  Humalog.  Start Humalog 3x a day 15 min before a meal: - 20 units before a smaller  meal - 26 units before a larger meal  If you do not eat, you may need to inject ~10 units Humalog every 4 hours, keeping an eye on your sugars.  Please let me know if the sugars are consistently <80 or >200.  Please return in 1.5 months with your sugar log.   - Strongly advised her to start checking sugars at different times of the day - check 2-3 times a day, rotating checks - given sugar log and advised how to fill it and to bring it at next appt  - given foot care handout and explained the principles  - given instructions for hypoglycemia management "15-15 rule"  - advised for yearly eye exams  - Return to clinic in 1.5 mo with sugar log   [name], MD PhD [facility]

## 2017-01-27 NOTE — Patient Instructions (Signed)
Please split Levemir in 50 units x 2 injections at bedtime.  Stop 75/25 Humalog.  Start Humalog 3x a day 15 min before a meal: - 20 units before a smaller meal - 26 units before a larger meal  If you do not eat, you may need to inject ~10 units Humalog every 4 hours, keeping an eye on your sugars.  Please let me know if the sugars are consistently <80 or >200.  Please return in 1.5 months with your sugar log.   PATIENT INSTRUCTIONS FOR TYPE 2 DIABETES:  **Please join MyChart!** - see attached instructions about how to join if you have not done so already.  DIET AND EXERCISE Diet and exercise is an important part of diabetic treatment.  We recommended aerobic exercise in the form of brisk walking (working between 40-60% of maximal aerobic capacity, similar to brisk walking) for 150 minutes per week (such as 30 minutes five days per week) along with 3 times per week performing 'resistance' training (using various gauge rubber tubes with handles) 5-10 exercises involving the major muscle groups (upper body, lower body and core) performing 10-15 repetitions (or near fatigue) each exercise. Start at half the above goal but build slowly to reach the above goals. If limited by weight, joint pain, or disability, we recommend daily walking in a swimming pool with water up to waist to reduce pressure from joints while allow for adequate exercise.    BLOOD GLUCOSES Monitoring your blood glucoses is important for continued management of your diabetes. Please check your blood glucoses 2-4 times a day: fasting, before meals and at bedtime (you can rotate these measurements - e.g. one day check before the 3 meals, the next day check before 2 of the meals and before bedtime, etc.).   HYPOGLYCEMIA (low blood sugar) Hypoglycemia is usually a reaction to not eating, exercising, or taking too much insulin/ other diabetes drugs.  Symptoms include tremors, sweating, hunger, confusion, headache, etc. Treat  IMMEDIATELY with 15 grams of Carbs: . 4 glucose tablets .  cup regular juice/soda . 2 tablespoons raisins . 4 teaspoons sugar . 1 tablespoon honey Recheck blood glucose in 15 mins and repeat above if still symptomatic/blood glucose <100.  RECOMMENDATIONS TO REDUCE YOUR RISK OF DIABETIC COMPLICATIONS: * Take your prescribed MEDICATION(S) * Follow a DIABETIC diet: Complex carbs, fiber rich foods, (monounsaturated and polyunsaturated) fats * AVOID saturated/trans fats, high fat foods, >2,300 mg salt per day. * EXERCISE at least 5 times a week for 30 minutes or preferably daily.  * DO NOT SMOKE OR DRINK more than 1 drink a day. * Check your FEET every day. Do not wear tightfitting shoes. Contact us if you develop an ulcer * See your EYE doctor once a year or more if needed * Get a FLU shot once a year * Get a PNEUMONIA vaccine once before and once after age 43 years  GOALS:  * Your Hemoglobin A1c of <7%  * fasting sugars need to be <130 * after meals sugars need to be <180 (2h after you start eating) * Your Systolic BP should be 893 or lower  * Your Diastolic BP should be 80 or lower  * Your HDL (Good Cholesterol) should be 40 or higher  * Your LDL (Bad Cholesterol) should be 100 or lower. * Your Triglycerides should be 150 or lower  * Your Urine microalbumin (kidney function) should be <30 * Your Body Mass Index should be 25 or lower    Please consider  the following ways to cut down carbs and fat and increase fiber and micronutrients in your diet: - substitute whole grain for white bread or pasta - substitute brown rice for white rice - substitute 90-calorie flat bread pieces for slices of bread when possible - substitute sweet potatoes or yams for white potatoes - substitute humus for margarine - substitute tofu for cheese when possible - substitute almond or rice milk for regular milk (would not drink soy milk daily due to concern for soy estrogen influence on breast cancer  risk) - substitute dark chocolate for other sweets when possible - substitute water - can add lemon or orange slices for taste - for diet sodas (artificial sweeteners will trick your body that you can eat sweets without getting calories and will lead you to overeating and weight gain in the long run) - do not skip breakfast or other meals (this will slow down the metabolism and will result in more weight gain over time)  - can try smoothies made from fruit and almond/rice milk in am instead of regular breakfast - can also try old-fashioned (not instant) oatmeal made with almond/rice milk in am - order the dressing on the side when eating salad at a restaurant (pour less than half of the dressing on the salad) - eat as little meat as possible - can try juicing, but should not forget that juicing will get rid of the fiber, so would alternate with eating raw veg./fruits or drinking smoothies - use as little oil as possible, even when using olive oil - can dress a salad with a mix of balsamic vinegar and lemon juice, for e.g. - use agave nectar, stevia sugar, or regular sugar rather than artificial sweateners - steam or broil/roast veggies  - snack on veggies/fruit/nuts (unsalted, preferably) when possible, rather than processed foods - reduce or eliminate aspartame in diet (it is in diet sodas, chewing gum, etc) Read the labels!  Try to read Dr. Janene Harvey book: "Program for Reversing Diabetes" for other ideas for healthy eating.

## 2017-01-28 NOTE — Procedures (Signed)
DIET DIARY: SAUSAGE/EGGS, CHICKEN, STRING BEANS, MAC & CHEESE, GRITS, WEINER, BBQ, STRING BEANS, SALAD, RIBS. EATS AT 8 PM & LAYS DOWN AT 10P.  PROCEDURE:  Procedure(s): BRAVO PH STUDY ON OMEPRAZOLE   SURGEON:  Surgeon(s): Dorothyann Peng, MD  FINDINGS:  PT HAD RECORDER FOR 48 HOURS. STUDY DURATION: 19:48   FRACTION Ph <4 TOTAL: 15.4    # OF REFLUXES: 75   LONG REFLUX > 5: 6   LONGEST REFLUX: 54 MINS   REFLUX TABLE: UPRIGHT 106 SUPINE 33 EPISODES  DEMEESTER SCORE DAY 1:  68.5 (NL < 14.72)  DEMEESTER SCORE DAY 2:  55.4 (NL < 14.72) DEMEESTER SCORE TOTAL SCORE:  63.9(NL < 14.72)  DIAGNOSIS: UNCONTROLLED GERD  PLAN: 1. AVOID TRIGGERS FOR REFLUX. . 2. CONTINUE DEXILANT.  3. FOLLOW A LOW FAT/DIABETIC DIET.  4. CONTINUE YOUR WEIGHT LOSS EFFORTS.  5. FOLLOW UP IN 4 MOS E30 GERD/DYSPHAGIA.

## 2017-02-10 ENCOUNTER — Encounter: Payer: Self-pay | Admitting: Gastroenterology

## 2017-02-10 ENCOUNTER — Ambulatory Visit (INDEPENDENT_AMBULATORY_CARE_PROVIDER_SITE_OTHER): Payer: 59 | Admitting: Gastroenterology

## 2017-02-10 VITALS — BP 177/81 | HR 74 | Temp 97.3°F | Ht 64.0 in | Wt 207.8 lb

## 2017-02-10 DIAGNOSIS — K219 Gastro-esophageal reflux disease without esophagitis: Secondary | ICD-10-CM | POA: Diagnosis not present

## 2017-02-10 DIAGNOSIS — Z860100 Personal history of colon polyps, unspecified: Secondary | ICD-10-CM | POA: Insufficient documentation

## 2017-02-10 HISTORY — DX: Personal history of colon polyps, unspecified: Z86.0100

## 2017-02-10 NOTE — Assessment & Plan Note (Signed)
Multiple polyps with surveillance due in 2021.

## 2017-02-10 NOTE — Assessment & Plan Note (Signed)
61 year old with improved reflux control and resolved dysphagia on Protonix once daily instead of Prilosec. Both EGD and Bravo study on file. Continue dietary and behavior modifications and Protonix once daily. Return in 1 year.

## 2017-02-10 NOTE — Patient Instructions (Signed)
I am glad you are doing well with Protonix! Continue to take this once each morning, 30 minutes before breakfast.  Have a wonderful holiday season. Please call us if you need anything!  We will see you in 1 year!

## 2017-02-10 NOTE — Progress Notes (Signed)
Referring Provider: Fayrene Helper, MD Primary Care Physician:  Fayrene Helper, MD Primary GI: Dr. Oneida Alar   Chief Complaint  Patient presents with  . Gastroesophageal Reflux    Protonix helping    HPI:   Kathryn Beck is a 61 y.o. female presenting today with a history of dysphagia, GERD, multiple adenomas with surveillance due in 2021. Bravo study completed several months ago with uncontrolled reflux on Prilosec.   Taking Protonix once each morning. No dysphagia. Rare abdominal pain. No constipation. Very seldom has to take Bentyl. Rare nausea. Nausea depends on what she eats : fatty food, spicy foods.    Past Medical History:  Diagnosis Date  . Anemia   . Axillary masses    Soft tissue - status post excision  . Back pain   . Chronic kidney disease   . Essential hypertension   . Ischemic heart disease    Abnormal Myoview April 2018 - medical therapy  . Mixed hyperlipidemia   . Obesity   . Sleep apnea    Noncompliant with CPAP  . Type 2 diabetes mellitus, uncontrolled ([place])     Past Surgical History:  Procedure Laterality Date  . ABDOMINAL HYSTERECTOMY    . COLONOSCOPY  2008   Dr. Oneida Alar: normal   . COLONOSCOPY N/A 12/18/2016   Dr. Oneida Alar: multiple tubular adenomas, internal hemorrhoids. Surveillance in 3 years   . ESOPHAGEAL DILATION N/A 10/13/2015   Procedure: ESOPHAGEAL DILATION;  Surgeon: Rogene Houston, MD;  [facility];  Service: Endoscopy;  Laterality: N/A;  . ESOPHAGOGASTRODUODENOSCOPY N/A 10/13/2015   Dr. Laural Golden: chronic gastritis on path, no H.pylori. Empiric dilation   . ESOPHAGOGASTRODUODENOSCOPY N/A 12/18/2016   Dr. Oneida Alar: mild gastritis. BRAVO study revealed uncontrolled GERD. Dysphagia secondary to uncontrolled reflux  . FOOT SURGERY Bilateral   . LUNG BIOPSY    . MASS EXCISION Right 01/09/2013   Procedure: EXCISION OF NEOPLASM OF RIGHT  AXILLA  AND EXCISION OF NEOPLASM OF LEFT AXILLA;  Surgeon: Jamesetta So, MD;   Location: [facility];  Service: General;  Laterality: Right;  procedure end @ 08:23  . SAVORY DILATION N/A 12/18/2016   Procedure: SAVORY DILATION;  Surgeon: Danie Binder, MD;  Location: [facility];  Service: Endoscopy;  Laterality: N/A;    Current Outpatient Medications  Medication Sig Dispense Refill  . ACCU-CHEK FASTCLIX LANCETS MISC USE AS INSTRUCTED THREE  TIMES DAILY 306 each 5  . acetaminophen (TYLENOL) 500 MG tablet Take 1,000 mg by mouth every 6 (six) hours as needed for mild pain, moderate pain or headache.    . allopurinol (ZYLOPRIM) 300 MG tablet TAKE 1 TABLET (300 MG TOTAL) BY MOUTH DAILY. 30 tablet 3  . amLODipine (NORVASC) 10 MG tablet TAKE 1 TABLET BY MOUTH  DAILY 90 tablet 1  . atorvastatin (LIPITOR) 20 MG tablet Take 1 tablet (20 mg total) by mouth daily. 90 tablet 3  . azelastine (ASTELIN) 0.1 % nasal spray Place 2 sprays into both nostrils 2 (two) times daily. Use in each nostril as directed 30 mL 2  . calcitRIOL (ROCALTROL) 0.25 MCG capsule One capsule three times weekly ( Monday, Wednesday, Friday) 36 capsule 0  . ciprofloxacin-dexamethasone (CIPRODEX) OTIC suspension Place 4 drops into both ears 2 (two) times daily.    . cloNIDine (CATAPRES) 0.1 MG tablet TAKE 1 TABLET BY MOUTH  EVERY EVENING AT 9PM FOR  BLOOD PRESSURE 90 tablet 1  . cyclobenzaprine (FLEXERIL) 10 MG tablet  Take 1 tablet (10 mg total) by mouth at bedtime. 30 tablet 3  . dicyclomine (BENTYL) 10 MG capsule 1 po qachs if needed to prevent diarrhea and abdominal cramps 120 capsule 3  . ezetimibe (ZETIA) 10 MG tablet Take 1 tablet (10 mg total) by mouth daily. 30 tablet 5  . furosemide (LASIX) 40 MG tablet Take 1 tablet (40 mg total) by mouth daily. 7 tablet 0  . glucose blood (ACCU-CHEK AVIVA PLUS) test strip USE 4 TIMES DAILY E11.65 300 each 2  . Insulin Detemir (LEVEMIR) 100 UNIT/ML Pen Inject 100 Units into the skin daily at 10 pm. 30 mL 11  . Insulin Lispro (HUMALOG KWIKPEN) 200 UNIT/ML SOPN Inject 20-26  Units 3 (three) times daily before meals into the skin. 10 pen 5  . Insulin Pen Needle (B-D UF III MINI PEN NEEDLES) 31G X 5 MM MISC USE AS INSTRUCTED THREE  TIMES DAILY 300 each 5  . labetalol (NORMODYNE) 200 MG tablet Take 2 tablets (400 mg total) by mouth 2 (two) times daily. 360 tablet 3  . montelukast (SINGULAIR) 10 MG tablet Take 10 mg by mouth at bedtime.    Marland Kitchen olmesartan (BENICAR) 40 MG tablet Take 1 tablet (40 mg total) by mouth daily. 90 tablet 1  . ondansetron (ZOFRAN) 4 MG tablet Take 1 tablet (4 mg total) by mouth every 8 (eight) hours as needed for nausea or vomiting. 30 tablet 1  . pantoprazole (PROTONIX) 40 MG tablet 1 PO 30 MINUTES PRIOR TO MEALS BID 60 tablet 11  . spironolactone (ALDACTONE) 25 MG tablet Take 1 tablet (25 mg total) by mouth 2 (two) times daily. 180 tablet 1  . triamterene-hydrochlorothiazide (MAXZIDE) 75-50 MG tablet Take 1 tablet by mouth daily. 90 tablet 1  . Vitamin D, Ergocalciferol, (DRISDOL) 50000 units CAPS capsule TAKE 1 CAPSULE (50,000 UNITS TOTAL) BY MOUTH EVERY 7 (SEVEN) DAYS. (Patient taking differently: Take 50,000 Units by mouth every 7 (seven) days. Takes on Monday) 4 capsule 0   No current facility-administered medications for this visit.     Allergies as of 02/10/2017 - Review Complete 02/10/2017  Allergen Reaction Noted  . Ace inhibitors Anaphylaxis and Swelling 06/07/2007  . Penicillins Itching and Swelling 06/07/2007  . Statins Other (See Comments) 12/24/2012    Family History  Problem Relation Age of Onset  . Hypertension Father   . Hypercholesterolemia Father   . Arthritis Father   . Hypertension Sister   . Hypercholesterolemia Sister   . Breast cancer Sister   . Hypertension Sister   . Colon cancer Neg Hx   . Colon polyps Neg Hx     Social History   Socioeconomic History  . Marital status: Married    Spouse name: None  . Number of children: None  . Years of education: None  . Highest education level: None  Social Needs   . Financial resource strain: None  . Food insecurity - worry: None  . Food insecurity - inability: None  . Transportation needs - medical: None  . Transportation needs - non-medical: None  Occupational History  . None  Tobacco Use  . Smoking status: Never Smoker  . Smokeless tobacco: Never Used  Substance and Sexual Activity  . Alcohol use: No  . Drug use: No  . Sexual activity: Not Currently  Other Topics Concern  . None  Social History Narrative  . None    Review of Systems: Gen: Denies fever, chills, anorexia. Denies fatigue, weakness, weight loss.  CV:  Denies chest pain, palpitations, syncope, peripheral edema, and claudication. Resp: Denies dyspnea at rest, cough, wheezing, coughing up blood, and pleurisy. GI: see HPI  Derm: Denies rash, itching, dry skin Psych: Denies depression, anxiety, memory loss, confusion. No homicidal or suicidal ideation.  Heme: Denies bruising, bleeding, and enlarged lymph nodes.  Physical Exam: BP (!) 177/81   Pulse 74   Temp (!) 97.3 F (36.3 C) (Oral)   Ht 5\' 4"  (1.626 m)   Wt 207 lb 12.8 oz (94.3 kg)   BMI 35.67 kg/m  General:   Alert and oriented. No distress noted. Pleasant and cooperative.  Head:  Normocephalic and atraumatic. Eyes:  Conjuctiva clear without scleral icterus. Mouth:  Oral mucosa pink and moist.  Abdomen:  +BS, soft, non-tender and non-distended. No rebound or guarding. No HSM or masses noted. Msk:  Symmetrical without gross deformities. Normal posture. Extremities:  Without edema. Neurologic:  Alert and  oriented x4 Psych:  Alert and cooperative. Normal mood and affect.

## 2017-02-14 ENCOUNTER — Emergency Department (HOSPITAL_COMMUNITY)
Admission: EM | Admit: 2017-02-14 | Discharge: 2017-02-14 | Disposition: A | Discharge status: Home / Self Care | Payer: 59 | Attending: Emergency Medicine | Admitting: Emergency Medicine

## 2017-02-14 ENCOUNTER — Other Ambulatory Visit: Payer: Self-pay

## 2017-02-14 ENCOUNTER — Encounter (HOSPITAL_COMMUNITY): Payer: Self-pay

## 2017-02-14 ENCOUNTER — Emergency Department (HOSPITAL_COMMUNITY): Payer: 59

## 2017-02-14 DIAGNOSIS — Z79899 Other long term (current) drug therapy: Secondary | ICD-10-CM | POA: Insufficient documentation

## 2017-02-14 DIAGNOSIS — R0789 Other chest pain: Secondary | ICD-10-CM | POA: Insufficient documentation

## 2017-02-14 DIAGNOSIS — Z794 Long term (current) use of insulin: Secondary | ICD-10-CM | POA: Insufficient documentation

## 2017-02-14 DIAGNOSIS — E1122 Type 2 diabetes mellitus with diabetic chronic kidney disease: Secondary | ICD-10-CM | POA: Insufficient documentation

## 2017-02-14 DIAGNOSIS — R0600 Dyspnea, unspecified: Secondary | ICD-10-CM | POA: Diagnosis not present

## 2017-02-14 DIAGNOSIS — I129 Hypertensive chronic kidney disease with stage 1 through stage 4 chronic kidney disease, or unspecified chronic kidney disease: Secondary | ICD-10-CM | POA: Diagnosis not present

## 2017-02-14 DIAGNOSIS — N184 Chronic kidney disease, stage 4 (severe): Secondary | ICD-10-CM | POA: Diagnosis not present

## 2017-02-14 LAB — TROPONIN I: Troponin I: <0.03 ng/mL (ref <0.03)

## 2017-02-14 LAB — BASIC METABOLIC PANEL
Anion gap: 12 (ref 5–15)
BUN: 52 mg/dL — AB (ref 6–20)
CO2: 21 mmol/L — ABNORMAL LOW (ref 22–32)
CREATININE: 2.97 mg/dL — AB (ref 0.44–1.00)
Calcium: 9.5 mg/dL (ref 8.9–10.3)
Chloride: 104 mmol/L (ref 101–111)
GFR calc Af Amer: 19 mL/min — ABNORMAL LOW (ref >60)
GFR, EST NON AFRICAN AMERICAN: 16 mL/min — AB (ref >60)
GLUCOSE: 436 mg/dL — AB (ref 65–99)
Potassium: 4.9 mmol/L (ref 3.5–5.1)
SODIUM: 137 mmol/L (ref 135–145)

## 2017-02-14 LAB — CBC
HCT: 31.5 % — ABNORMAL LOW (ref 36.0–46.0)
Hemoglobin: 9.3 g/dL — ABNORMAL LOW (ref 12.0–15.0)
MCH: 22 pg — ABNORMAL LOW (ref 26.0–34.0)
MCHC: 29.5 g/dL — AB (ref 30.0–36.0)
MCV: 74.6 fL — ABNORMAL LOW (ref 78.0–100.0)
PLATELETS: 406 10*3/uL — AB (ref 150–400)
RBC: 4.22 MIL/uL (ref 3.87–5.11)
RDW: 16.7 % — AB (ref 11.5–15.5)
WBC: 7.1 10*3/uL (ref 4.0–10.5)

## 2017-02-14 LAB — HEPATIC FUNCTION PANEL
ALBUMIN: 3.1 g/dL — AB (ref 3.5–5.0)
ALT: 22 U/L (ref 14–54)
AST: 22 U/L (ref 15–41)
Alkaline Phosphatase: 113 U/L (ref 38–126)
BILIRUBIN TOTAL: 0.5 mg/dL (ref 0.3–1.2)
Bilirubin, Direct: <0.1 mg/dL — ABNORMAL LOW (ref 0.1–0.5)
TOTAL PROTEIN: 7.2 g/dL (ref 6.5–8.1)

## 2017-02-14 LAB — BRAIN NATRIURETIC PEPTIDE: B NATRIURETIC PEPTIDE 5: 51 pg/mL (ref 0.0–100.0)

## 2017-02-14 MED ORDER — ACETAMINOPHEN 325 MG PO TABS
650.0000 mg | ORAL_TABLET | Freq: Once | ORAL | Status: AC
  Administered 2017-02-14: 650 mg via ORAL
  Filled 2017-02-14: qty 2

## 2017-02-14 MED ORDER — SODIUM CHLORIDE 0.9 % IV BOLUS (SEPSIS)
1000.0000 mL | Freq: Once | INTRAVENOUS | Status: AC
  Administered 2017-02-14: 1000 mL via INTRAVENOUS

## 2017-02-14 NOTE — ED Provider Notes (Signed)
[facility] EMERGENCY DEPARTMENT Provider Note   CSN: [id number] Arrival date & time: 02/14/17  1102     History   Chief Complaint Chief Complaint  Patient presents with  . Chest Pain    HPI Kathryn Beck is a 61 y.o. female.  HPI Chest pain. She notes that she actually developed back pain 3 days ago, but over the course of the illness has developed superior chest pain as well. Back pain was initially in the right flank, but spread to include the entire back, and was sore. The chest pain is also sore, nonradiating, and the upper chest. Some associated dyspnea, cough, but no fever.  There is nausea, but no vomiting.  Patient acknowledges multiple medical issues including chronic kidney disease, hypertension, diabetes. She has not taken any medication for pain relief, but continues to take all regularly prescribed medication as directed. Past Medical History:  Diagnosis Date  . Anemia   . Axillary masses    Soft tissue - status post excision  . Back pain   . Chronic kidney disease   . Essential hypertension   . Ischemic heart disease    Abnormal Myoview April 2018 - medical therapy  . Mixed hyperlipidemia   . Obesity   . Sleep apnea    Noncompliant with CPAP  . Type 2 diabetes mellitus, uncontrolled ([place])     Patient Active Problem List   Diagnosis Date Noted  . History of colonic polyps 02/10/2017  . Need for shingles vaccine 01/03/2017  . Dysphagia 11/05/2016  . IBS (irritable bowel syndrome) 02/04/2016  . Personal history of noncompliance with medical treatment, presenting hazards to health 10/21/2015  . Postprandial epigastric pain   . Heat sensitivity 10/07/2014  . Precordial pain 01/17/2014  . Cardiac murmur 01/17/2014  . Annual physical exam 01/15/2014  . Colon cancer screening 01/15/2014  . Hyperuricemia 08/04/2013  . Seasonal allergies 03/10/2013  . Muscle spasm 12/24/2012  . Lipoma of back 12/22/2012  . Nausea without vomiting 10/02/2012  . GERD  (gastroesophageal reflux disease) 08/22/2012  . Sleep apnea 06/02/2011  . BACK PAIN WITH RADICULOPATHY 04/16/2010  . FATIGUE 07/24/2009  . LIVER FUNCTION TESTS, ABNORMAL, HX OF 05/08/2009  . Anemia, deficiency 06/05/2008  . LUPUS ERYTHEMATOSUS, DISCOID 06/05/2008  . Type 2 diabetes mellitus with stage 4 chronic kidney disease, with long-term current use of insulin ([place]) 06/07/2007  . Mixed hyperlipidemia 06/07/2007  . Obesity 06/07/2007  . Essential hypertension 06/07/2007    Past Surgical History:  Procedure Laterality Date  . ABDOMINAL HYSTERECTOMY    . COLONOSCOPY  2008   Dr. Oneida Alar: normal   . COLONOSCOPY N/A 12/18/2016   Dr. Oneida Alar: multiple tubular adenomas, internal hemorrhoids. Surveillance in 3 years   . ESOPHAGEAL DILATION N/A 10/13/2015   Procedure: ESOPHAGEAL DILATION;  Surgeon: Rogene Houston, MD;  [facility];  Service: Endoscopy;  Laterality: N/A;  . ESOPHAGOGASTRODUODENOSCOPY N/A 10/13/2015   Dr. Laural Golden: chronic gastritis on path, no H.pylori. Empiric dilation   . ESOPHAGOGASTRODUODENOSCOPY N/A 12/18/2016   Dr. Oneida Alar: mild gastritis. BRAVO study revealed uncontrolled GERD. Dysphagia secondary to uncontrolled reflux  . FOOT SURGERY Bilateral   . LUNG BIOPSY    . MASS EXCISION Right 01/09/2013   Procedure: EXCISION OF NEOPLASM OF RIGHT  AXILLA  AND EXCISION OF NEOPLASM OF LEFT AXILLA;  Surgeon: Jamesetta So, MD;  Location: [facility];  Service: General;  Laterality: Right;  procedure end @ 08:23  . SAVORY DILATION N/A 12/18/2016   Procedure:  SAVORY DILATION;  Surgeon: Danie Binder, MD;  Location: [facility];  Service: Endoscopy;  Laterality: N/A;    OB History    No data available       Home Medications    Prior to Admission medications   Medication Sig Start Date End Date Taking? Authorizing Provider  ACCU-CHEK FASTCLIX LANCETS MISC USE AS INSTRUCTED THREE  TIMES DAILY 03/05/16   Fayrene Helper, MD  acetaminophen (TYLENOL) 500 MG tablet  Take 1,000 mg by mouth every 6 (six) hours as needed for mild pain, moderate pain or headache.    [provider]  allopurinol (ZYLOPRIM) 300 MG tablet TAKE 1 TABLET (300 MG TOTAL) BY MOUTH DAILY. 10/28/15   Fayrene Helper, MD  amLODipine (NORVASC) 10 MG tablet TAKE 1 TABLET BY MOUTH  DAILY 03/05/16   Fayrene Helper, MD  atorvastatin (LIPITOR) 20 MG tablet Take 1 tablet (20 mg total) by mouth daily. 12/28/16   Fayrene Helper, MD  azelastine (ASTELIN) 0.1 % nasal spray Place 2 sprays into both nostrils 2 (two) times daily. Use in each nostril as directed 12/24/16   Fayrene Helper, MD  calcitRIOL (ROCALTROL) 0.25 MCG capsule One capsule three times weekly ( Monday, Wednesday, Friday) 09/15/16   Fayrene Helper, MD  ciprofloxacin-dexamethasone (CIPRODEX) OTIC suspension Place 4 drops into both ears 2 (two) times daily.    [provider]  cloNIDine (CATAPRES) 0.1 MG tablet TAKE 1 TABLET BY MOUTH  EVERY EVENING AT 9PM FOR  BLOOD PRESSURE 11/24/16   Fayrene Helper, MD  cyclobenzaprine (FLEXERIL) 10 MG tablet Take 1 tablet (10 mg total) by mouth at bedtime. 10/22/15   Fayrene Helper, MD  dicyclomine (BENTYL) 10 MG capsule 1 po qachs if needed to prevent diarrhea and abdominal cramps 12/18/16   Fields, Marga Melnick, MD  ezetimibe (ZETIA) 10 MG tablet Take 1 tablet (10 mg total) by mouth daily. 01/31/16   Fayrene Helper, MD  furosemide (LASIX) 40 MG tablet Take 1 tablet (40 mg total) by mouth daily. 09/15/16   Fayrene Helper, MD  glucose blood (ACCU-CHEK AVIVA PLUS) test strip USE 4 TIMES DAILY E11.65 09/07/16   Cassandria Anger, MD  Insulin Detemir (LEVEMIR) 100 UNIT/ML Pen Inject 100 Units into the skin daily at 10 pm. 12/24/16   Fayrene Helper, MD  Insulin Lispro (HUMALOG KWIKPEN) 200 UNIT/ML SOPN Inject 20-26 Units 3 (three) times daily before meals into the skin. 01/27/17   [name], MD  Insulin Pen Needle (B-D UF III MINI PEN NEEDLES) 31G X 5  MM MISC USE AS INSTRUCTED THREE  TIMES DAILY 01/27/17   [name], MD  labetalol (NORMODYNE) 200 MG tablet Take 2 tablets (400 mg total) by mouth 2 (two) times daily. 11/24/16   Fayrene Helper, MD  montelukast (SINGULAIR) 10 MG tablet Take 10 mg by mouth at bedtime.    [provider]  olmesartan (BENICAR) 40 MG tablet Take 1 tablet (40 mg total) by mouth daily. 11/24/16   Fayrene Helper, MD  ondansetron (ZOFRAN) 4 MG tablet Take 1 tablet (4 mg total) by mouth every 8 (eight) hours as needed for nausea or vomiting. 11/05/16   Annitta Needs, NP  pantoprazole (PROTONIX) 40 MG tablet 1 PO 30 MINUTES PRIOR TO MEALS BID 01/05/17   Fields, Marga Melnick, MD  spironolactone (ALDACTONE) 25 MG tablet Take 1 tablet (25 mg total) by mouth 2 (two) times daily. 11/24/16   Moshe Cipro,  Norwood Levo, MD  triamterene-hydrochlorothiazide (MAXZIDE) 75-50 MG tablet Take 1 tablet by mouth daily. 11/24/16   Fayrene Helper, MD  Vitamin D, Ergocalciferol, (DRISDOL) 50000 units CAPS capsule TAKE 1 CAPSULE (50,000 UNITS TOTAL) BY MOUTH EVERY 7 (SEVEN) DAYS. Patient taking differently: Take 50,000 Units by mouth every 7 (seven) days. Takes on Monday 08/24/16   Fayrene Helper, MD    Family History Family History  Problem Relation Age of Onset  . Hypertension Father   . Hypercholesterolemia Father   . Arthritis Father   . Hypertension Sister   . Hypercholesterolemia Sister   . Breast cancer Sister   . Hypertension Sister   . Colon cancer Neg Hx   . Colon polyps Neg Hx     Social History Social History   Tobacco Use  . Smoking status: Never Smoker  . Smokeless tobacco: Never Used  Substance Use Topics  . Alcohol use: No  . Drug use: No     Allergies   Ace inhibitors; Penicillins; and Statins   Review of Systems Review of Systems  Constitutional:       Per HPI, otherwise negative  HENT:       Per HPI, otherwise negative  Respiratory:       Per HPI, otherwise negative  Cardiovascular:        Per HPI, otherwise negative  Gastrointestinal: Negative for vomiting.  Endocrine:       Negative aside from HPI  Genitourinary:       Neg aside from HPI   Musculoskeletal:       Per HPI, otherwise negative  Skin: Negative.   Neurological: Negative for syncope.     Physical Exam Updated Vital Signs BP (!) 177/85 (BP Location: Right Arm)   Pulse 87   Temp 98 F (36.7 C) (Oral)   Resp 16   Ht 5\' 4"  (1.626 m)   Wt 92.1 kg (203 lb)   SpO2 100%   BMI 34.84 kg/m   Physical Exam  Constitutional: She is oriented to person, place, and time. She appears well-developed and well-nourished. No distress.  HENT:  Head: Normocephalic and atraumatic.  Eyes: Conjunctivae and EOM are normal.  Cardiovascular: Normal rate and regular rhythm.  Pulmonary/Chest: Effort normal and breath sounds normal. No stridor. No respiratory distress.  Abdominal: She exhibits no distension.  Musculoskeletal: She exhibits no edema.  Neurological: She is alert and oriented to person, place, and time. No cranial nerve deficit.  Skin: Skin is warm and dry.  Psychiatric: She has a normal mood and affect.  Nursing note and vitals reviewed.    ED Treatments / Results  Labs (all labs ordered are listed, but only abnormal results are displayed) Labs Reviewed  BASIC METABOLIC PANEL - Abnormal; Notable for the following components:      Result Value   CO2 21 (*)    Glucose, Bld 436 (*)    BUN 52 (*)    Creatinine, Ser 2.97 (*)    GFR calc non Af Amer 16 (*)    GFR calc Af Amer 19 (*)    All other components within normal limits  CBC - Abnormal; Notable for the following components:   Hemoglobin 9.3 (*)    HCT 31.5 (*)    MCV 74.6 (*)    MCH 22.0 (*)    MCHC 29.5 (*)    RDW 16.7 (*)    Platelets 406 (*)    All other components within normal limits  TROPONIN I  EKG  EKG Interpretation  Date/Time:  Sunday February 14 2017 11:09:44 EST Ventricular Rate:  80 PR Interval:  142 QRS  Duration: 82 QT Interval:  374 QTC Calculation: 431 R Axis:   -3 Text Interpretation:  Normal sinus rhythm Normal ECG Normal ECG Confirmed by Carmin Muskrat [phone]) on 02/14/2017 11:40:50 AM       Radiology Dg Chest 2 View  Result Date: 02/14/2017 CLINICAL DATA:  Chest pain since 02/12/2017. EXAM: CHEST  2 VIEW COMPARISON:  PA and lateral chest 12/02/2016 and 07/02/2015. FINDINGS: Small focus of discoid atelectasis in the lingula is noted. Lungs are otherwise clear. No pneumothorax or pleural effusion. Heart size is upper normal. No acute bony abnormality. IMPRESSION: No acute disease. Electronically Signed   By: Inge Rise M.D.   On: 02/14/2017 12:15    Procedures Procedures (including critical care time)  Medications Ordered in ED Medications  acetaminophen (TYLENOL) tablet 650 mg (650 mg Oral Given 02/14/17 1243)  sodium chloride 0.9 % bolus 1,000 mL (1,000 mLs Intravenous New Bag/Given 02/14/17 1413)     Initial Impression / Assessment and Plan / ED Course  I have reviewed the triage vital signs and the nursing notes.  Pertinent labs & imaging results that were available during my care of the patient were reviewed by me and considered in my medical decision making (see chart for details).  3:22 PM Patient notes that she feels substantially better, with minimal discomfort, no dyspnea, no nausea. I have previously discussed the initial results with her in multiple family members, and there is some suspicion for worsening renal function/dehydration is contributing to her soreness in multiple areas, but given the otherwise reassuring findings, with no evidence for ACS, pneumonia, pneumothorax, PE, and with improvement here following fluid resuscitation, the patient is appropriate for discharge with close outpatient follow-up.  Final Clinical Impressions(s) / ED Diagnoses   Final diagnoses:  Atypical chest pain     Carmin Muskrat, MD 02/14/17 1523

## 2017-02-14 NOTE — ED Triage Notes (Signed)
Reports of upper back pain that radiates into chest since Friday. States pain is worse when taking a deep breath in.

## 2017-02-14 NOTE — Discharge Instructions (Signed)
As discussed, your evaluation today has been largely reassuring.  But, it is important that you monitor your condition carefully, and do not hesitate to return to the ED if you develop new, or concerning changes in your condition. ? ?Otherwise, please follow-up with your physician for appropriate ongoing care. ? ?

## 2017-02-15 ENCOUNTER — Telehealth: Payer: Self-pay | Admitting: Gastroenterology

## 2017-02-15 ENCOUNTER — Other Ambulatory Visit (HOSPITAL_COMMUNITY)
Admission: RE | Admit: 2017-02-15 | Discharge: 2017-02-15 | Disposition: A | Discharge status: Home / Self Care | Payer: 59 | Source: Ambulatory Visit | Attending: Family Medicine | Admitting: Family Medicine

## 2017-02-15 ENCOUNTER — Other Ambulatory Visit: Payer: Self-pay

## 2017-02-15 ENCOUNTER — Encounter: Payer: Self-pay | Admitting: Family Medicine

## 2017-02-15 ENCOUNTER — Ambulatory Visit (HOSPITAL_COMMUNITY)
Admission: RE | Admit: 2017-02-15 | Discharge: 2017-02-15 | Disposition: A | Discharge status: Home / Self Care | Payer: 59 | Source: Ambulatory Visit | Attending: Family Medicine | Admitting: Family Medicine

## 2017-02-15 ENCOUNTER — Ambulatory Visit (INDEPENDENT_AMBULATORY_CARE_PROVIDER_SITE_OTHER): Payer: 59 | Admitting: Family Medicine

## 2017-02-15 VITALS — BP 130/82 | HR 82 | Resp 16 | Ht 64.0 in | Wt 206.0 lb

## 2017-02-15 DIAGNOSIS — I10 Essential (primary) hypertension: Secondary | ICD-10-CM | POA: Diagnosis not present

## 2017-02-15 DIAGNOSIS — R110 Nausea: Secondary | ICD-10-CM | POA: Diagnosis not present

## 2017-02-15 DIAGNOSIS — E782 Mixed hyperlipidemia: Secondary | ICD-10-CM

## 2017-02-15 DIAGNOSIS — R1011 Right upper quadrant pain: Secondary | ICD-10-CM | POA: Insufficient documentation

## 2017-02-15 DIAGNOSIS — K219 Gastro-esophageal reflux disease without esophagitis: Secondary | ICD-10-CM

## 2017-02-15 DIAGNOSIS — K769 Liver disease, unspecified: Secondary | ICD-10-CM | POA: Diagnosis not present

## 2017-02-15 DIAGNOSIS — N184 Chronic kidney disease, stage 4 (severe): Secondary | ICD-10-CM | POA: Diagnosis not present

## 2017-02-15 DIAGNOSIS — Z794 Long term (current) use of insulin: Secondary | ICD-10-CM | POA: Diagnosis not present

## 2017-02-15 DIAGNOSIS — Z1211 Encounter for screening for malignant neoplasm of colon: Secondary | ICD-10-CM

## 2017-02-15 DIAGNOSIS — R109 Unspecified abdominal pain: Secondary | ICD-10-CM

## 2017-02-15 DIAGNOSIS — E1122 Type 2 diabetes mellitus with diabetic chronic kidney disease: Secondary | ICD-10-CM

## 2017-02-15 DIAGNOSIS — R1310 Dysphagia, unspecified: Secondary | ICD-10-CM

## 2017-02-15 LAB — LIPASE, BLOOD: LIPASE: 163 U/L — AB (ref 11–51)

## 2017-02-15 LAB — POC HEMOCCULT BLD/STL (OFFICE/1-CARD/DIAGNOSTIC): Fecal Occult Blood, POC: NEGATIVE

## 2017-02-15 LAB — POCT URINALYSIS DIPSTICK
BILIRUBIN UA: NEGATIVE
GLUCOSE UA: 100
KETONES UA: NEGATIVE
Leukocytes, UA: NEGATIVE
Nitrite, UA: NEGATIVE
PH UA: 6 (ref 5.0–8.0)
Protein, UA: >300
SPEC GRAV UA: 1.02 (ref 1.010–1.025)
Urobilinogen, UA: 0.2 E.U./dL

## 2017-02-15 LAB — AMYLASE: Amylase: 82 U/L (ref 28–100)

## 2017-02-15 NOTE — Telephone Encounter (Signed)
[facility]. Appt scheduled for 04/16/17 at 1:00pm with Dr. Roosvelt Maser. She will receive packet in the mail from [place] with appt info. Referral info faxed to [facility] and informed pt of appt.

## 2017-02-15 NOTE — Patient Instructions (Addendum)
Please keep February appointment and call if you need me sooner  I am concerned that your abdominal pain may be related to acute pancreatitis, or gall stone problems  Rectal exam today shows no blood in the stool  Urine test shows no significant blood or sign of infection  I will convey to your GI Doc that you report difficulty swallowing mostly solids for at least 3 months  Labs today at the hospital on urgent basis at hospital are amylase, lipse and chem 7 and eGFR.you and your daughter will be contacted with the result, because if very abnormal you will need to go to the hospital [facility]  [phone], Kathryn Beck [phone])   [facility] are being  Referred for an [place] of your gall bladder

## 2017-02-15 NOTE — Telephone Encounter (Signed)
CONTACTED BY DR. Moshe Cipro. PT SEEN [facility] FOR CHEST PAIN AND DYSPHAGIA. EXPLAINED TO DR. Moshe Cipro THAT Pt just had an EGD/BRAVO STUDY. WHEN SHE EATS THE WRONG THINGS HER REFLUX IS UNCONTROLLED. THAT'S PROBABLY WHAT HAPPENED OVER THANKSGIVING. I EXPLAINED TO HER SHE CAN'T EAT ANYTHING AND EVERYTHING. IF HER REFLUX IS OUT OF CONTROL SHE WILL HAVE TROUBLE SWALLOWING.   REFER HER TO [facility] FOR DYSPHAGIA AND UNCONTROLLED REFLUX. REFER TO [facility].

## 2017-02-15 NOTE — Progress Notes (Signed)
cc'ed to pcp °

## 2017-02-25 LAB — HEPATIC FUNCTION PANEL
AG RATIO: 1.1 (calc) (ref 1.0–2.5)
ALT: 22 U/L (ref 6–29)
AST: 15 U/L (ref 10–35)
Albumin: 3.3 g/dL — ABNORMAL LOW (ref 3.6–5.1)
Alkaline phosphatase (APISO): 94 U/L (ref 33–130)
BILIRUBIN TOTAL: 0.2 mg/dL (ref 0.2–1.2)
Bilirubin, Direct: 0 mg/dL (ref 0.0–0.2)
GLOBULIN: 3.1 g/dL (ref 1.9–3.7)
Indirect Bilirubin: 0.2 mg/dL (calc) (ref 0.2–1.2)
TOTAL PROTEIN: 6.4 g/dL (ref 6.1–8.1)

## 2017-02-25 LAB — LIPID PANEL
CHOLESTEROL: 234 mg/dL — AB (ref <200)
HDL: 38 mg/dL — ABNORMAL LOW (ref >50)
Non-HDL Cholesterol (Calc): 196 mg/dL (calc) — ABNORMAL HIGH (ref <130)
Total CHOL/HDL Ratio: 6.2 (calc) — ABNORMAL HIGH (ref <5.0)
Triglycerides: 596 mg/dL — ABNORMAL HIGH (ref <150)

## 2017-03-01 ENCOUNTER — Other Ambulatory Visit: Payer: Self-pay | Admitting: Family Medicine

## 2017-03-02 ENCOUNTER — Encounter: Payer: Self-pay | Admitting: Family Medicine

## 2017-03-02 DIAGNOSIS — R10A1 Flank pain, right side: Secondary | ICD-10-CM | POA: Insufficient documentation

## 2017-03-02 DIAGNOSIS — R109 Unspecified abdominal pain: Secondary | ICD-10-CM | POA: Insufficient documentation

## 2017-03-02 NOTE — Progress Notes (Signed)
Kathryn Beck     MRN: [id number]      DOB: [birth year]   HPI Kathryn Beck is here for follow up of recent ED visit, was seen yesterday with c/o abdominal pain, here this morning stating the pain persists, radiates from back around to RUQ, associated with uncontrolled nausea and poor appetite. Reports uncontrolled GERD symptoms, states medication she is being treated with is ineffective, was just evaluated by GI 1 week ago, of note she has  markedly uncontrolled  IDDM complicated by gastroparesis which clearly contributes to her GI symptoms  ROS Denies recent fever or chills. Denies sinus pressure, nasal congestion, ear pain or sore throat. Denies chest congestion, productive cough or wheezing. Denies chest pains, palpitations and leg swelling Denies ,diarrhea or constipation.   Denies dysuria, frequency, hesitancy or incontinence. C/o  joint pain, denies  swelling  Does have some limitation in mobility. Denies headaches, seizures, numbness, or tingling. Denies depression, anxiety or insomnia. Denies skin break down or rash.   PE  BP 130/82   Pulse 82   Resp 16   Ht 5\' 4"  (1.626 m)   Wt 206 lb (93.4 kg)   SpO2 97%   BMI 35.36 kg/m   Patient alert and oriented and in no cardiopulmonary distress.Pt in pain  HEENT: No facial asymmetry, EOMI,   oropharynx pink and moist.  Neck supple no JVD, no mass.  Chest: Clear to auscultation bilaterally.  CVS: S1, S2 no murmurs, no S3.Regular rate.  ABD: Soft epigastric and RUQ tenderness , no guarding or rebound, normal BS, mild renal angle tenderness on right, no suprapubic tenderness Rectal: no mass, heme negative stool  Ext: No edema  MS: Adequate ROM spine, shoulders, hips and knees.  Skin: Intact, no ulcerations or rash noted.  Psych: Good eye contact, normal affect. Memory intact not anxious or depressed appearing.  CNS: CN 2-12 intact, power,  normal throughout.no focal deficits noted.   Assessment & Plan  Colicky RUQ  abdominal pain Needs [place] of gall bladder to evaluate for gall stones  Nausea without vomiting Check amylase and lipase to evaluate for pancreatitis  Acute right flank pain Office urinalysis to evaluate for blood or infection is negative  Essential hypertension Controlled, no change in medication DASH diet and commitment to daily physical activity for a minimum of 30 minutes discussed and encouraged, as a part of hypertension management. The importance of attaining a healthy weight is also discussed.  BP/Weight 02/15/2017 02/14/2017 02/10/2017 01/27/2017 01/18/2017 12/24/2016 1/91/4782  Systolic BP 956 213 086 578 - 469 629  Diastolic BP 82 66 81 82 - 70 61  Wt. (Lbs) 206 203 207.8 200 206.1 223.25 212  BMI 35.36 34.84 35.67 34.33 35.38 38.32 36.39       Mixed hyperlipidemia Uncontrolled Updated lab needed at/ before next visit.   GERD (gastroesophageal reflux disease) Uncontrolled with  C/o dysphagia, needs further GI evaluation  Type 2 diabetes mellitus with stage 4 chronic kidney disease, with long-term current use of insulin ([place]) Uncontrolled with complications, managed by endo Kathryn Beck is reminded of the importance of commitment to daily physical activity for 30 minutes or more, as able and the need to limit carbohydrate intake to 30 to 60 grams per meal to help with blood sugar control.   The need to take medication as prescribed, test blood sugar as directed, and to call between visits if there is a concern that blood sugar is uncontrolled is also discussed.   Kathryn Beck  is reminded of the importance of daily foot exam, annual eye examination, and good blood sugar, blood pressure and cholesterol control.  Diabetic Labs Latest Ref Rng & Units 02/25/2017 02/14/2017 12/24/2016 11/25/2016 11/16/2016  HbA1c <5.7 % of total Hgb - - - 12.5(H) -  Microalbumin Not estab mg/dL - - - - -  Micro/Creat Ratio <30 mcg/mg creat - - - - -  Chol <200 mg/dL 234(H) - 334(H) - -  HDL >50  mg/dL 38(L) - 53 - -  Calc LDL <100 mg/dL - - - - -  Triglycerides <150 mg/dL 596(H) - 1,350(H) - -  Creatinine 0.44 - 1.00 mg/dL - 2.97(H) - 2.63(H) 2.39(H)   BP/Weight 02/15/2017 02/14/2017 02/10/2017 01/27/2017 01/18/2017 12/24/2016 09/12/6331  Systolic BP 545 625 638 937 - 342 876  Diastolic BP 82 66 81 82 - 70 61  Wt. (Lbs) 206 203 207.8 200 206.1 223.25 212  BMI 35.36 34.84 35.67 34.33 35.38 38.32 36.39   Foot/eye exam completion dates Latest Ref Rng & Units 08/24/2016 01/13/2016  Eye Exam No Retinopathy - No Retinopathy  Foot Form Completion - Done -

## 2017-03-02 NOTE — Assessment & Plan Note (Signed)
Controlled, no change in medication DASH diet and commitment to daily physical activity for a minimum of 30 minutes discussed and encouraged, as a part of hypertension management. The importance of attaining a healthy weight is also discussed.  BP/Weight 02/15/2017 02/14/2017 02/10/2017 01/27/2017 01/18/2017 12/24/2016 11/19/5619  Systolic BP 308 657 846 962 - 952 841  Diastolic BP 82 66 81 82 - 70 61  Wt. (Lbs) 206 203 207.8 200 206.1 223.25 212  BMI 35.36 34.84 35.67 34.33 35.38 38.32 36.39

## 2017-03-02 NOTE — Assessment & Plan Note (Signed)
Office urinalysis to evaluate for blood or infection is negative

## 2017-03-02 NOTE — Assessment & Plan Note (Signed)
Uncontrolled with complications, managed by endo Kathryn Beck is reminded of the importance of commitment to daily physical activity for 30 minutes or more, as able and the need to limit carbohydrate intake to 30 to 60 grams per meal to help with blood sugar control.   The need to take medication as prescribed, test blood sugar as directed, and to call between visits if there is a concern that blood sugar is uncontrolled is also discussed.   Kathryn Beck is reminded of the importance of daily foot exam, annual eye examination, and good blood sugar, blood pressure and cholesterol control.  Diabetic Labs Latest Ref Rng & Units 02/25/2017 02/14/2017 12/24/2016 11/25/2016 11/16/2016  HbA1c <5.7 % of total Hgb - - - 12.5(H) -  Microalbumin Not estab mg/dL - - - - -  Micro/Creat Ratio <30 mcg/mg creat - - - - -  Chol <200 mg/dL 234(H) - 334(H) - -  HDL >50 mg/dL 38(L) - 53 - -  Calc LDL <100 mg/dL - - - - -  Triglycerides <150 mg/dL 596(H) - 1,350(H) - -  Creatinine 0.44 - 1.00 mg/dL - 2.97(H) - 2.63(H) 2.39(H)   BP/Weight 02/15/2017 02/14/2017 02/10/2017 01/27/2017 01/18/2017 12/24/2016 05/28/6576  Systolic BP 469 629 528 413 - 244 010  Diastolic BP 82 66 81 82 - 70 61  Wt. (Lbs) 206 203 207.8 200 206.1 223.25 212  BMI 35.36 34.84 35.67 34.33 35.38 38.32 36.39   Foot/eye exam completion dates Latest Ref Rng & Units 08/24/2016 01/13/2016  Eye Exam No Retinopathy - No Retinopathy  Foot Form Completion - Done -

## 2017-03-02 NOTE — Assessment & Plan Note (Signed)
Uncontrolled with  C/o dysphagia, needs further GI evaluation

## 2017-03-02 NOTE — Assessment & Plan Note (Signed)
Uncontrolled Updated lab needed at/ before next visit.  

## 2017-03-02 NOTE — Assessment & Plan Note (Signed)
Check amylase and lipase to evaluate for pancreatitis

## 2017-03-02 NOTE — Assessment & Plan Note (Signed)
Needs [place] of gall bladder to evaluate for gall stones

## 2017-03-11 ENCOUNTER — Ambulatory Visit (INDEPENDENT_AMBULATORY_CARE_PROVIDER_SITE_OTHER): Payer: PRIVATE HEALTH INSURANCE | Admitting: Psychiatry

## 2017-03-11 DIAGNOSIS — F509 Eating disorder, unspecified: Secondary | ICD-10-CM | POA: Diagnosis not present

## 2017-03-19 ENCOUNTER — Encounter: Payer: Self-pay | Admitting: Internal Medicine

## 2017-03-19 ENCOUNTER — Ambulatory Visit (INDEPENDENT_AMBULATORY_CARE_PROVIDER_SITE_OTHER): Payer: 59 | Admitting: Internal Medicine

## 2017-03-19 VITALS — BP 180/90 | HR 100 | Ht 64.0 in | Wt 208.0 lb

## 2017-03-19 DIAGNOSIS — N184 Chronic kidney disease, stage 4 (severe): Secondary | ICD-10-CM | POA: Diagnosis not present

## 2017-03-19 DIAGNOSIS — E782 Mixed hyperlipidemia: Secondary | ICD-10-CM

## 2017-03-19 DIAGNOSIS — Z794 Long term (current) use of insulin: Secondary | ICD-10-CM | POA: Diagnosis not present

## 2017-03-19 DIAGNOSIS — E1122 Type 2 diabetes mellitus with diabetic chronic kidney disease: Secondary | ICD-10-CM

## 2017-03-19 LAB — POCT GLYCOSYLATED HEMOGLOBIN (HGB A1C): Hemoglobin A1C: 11.5

## 2017-03-19 MED ORDER — INSULIN LISPRO 200 UNIT/ML ~~LOC~~ SOPN: 26.0000 [IU] | PEN_INJECTOR | Freq: Three times a day (TID) | SUBCUTANEOUS | 3 refills | Status: DC

## 2017-03-19 MED ORDER — INSULIN DETEMIR 100 UNIT/ML FLEXPEN: 100.0000 [IU] | PEN_INJECTOR | Freq: Every day | SUBCUTANEOUS | 3 refills | Status: DC

## 2017-03-19 MED ORDER — INSULIN PEN NEEDLE 31G X 5 MM MISC: 5 refills | Status: DC

## 2017-03-19 NOTE — Progress Notes (Signed)
Patient ID: Kathryn Beck, female   DOB: [birth year], 61 y.o.   MRN: [id number]  HPI: Kathryn Beck is a 61 y.o.-year-old female, initially referred by her PCP, Dr. Moshe Cipro, returning for follow-up for of DM2, dx in 2008, insulin-dependent since ~2015, uncontrolled, with complications (CKD stage 4, PN). She daw Dr. Dorris Fetch - last OV with him 05/2016.  Last visit with me 1.5 months ago.  She is preparing for gastric bypass surgery >> will be scheduled next year.  She had abdominal pain and chest pain related to acid reflux.  She was referred to GI at [facility].  She has a URI >> sugars higher.  Last hemoglobin A1c was: Lab Results  Component Value Date   HGBA1C 12.5 (H) 11/25/2016   HGBA1C 11.1 (H) 06/09/2016   HGBA1C 11.2 (H) 02/28/2016   Pt was on a regimen of: - Humalog 75/25 20-26 units before each meal, 2-3X a day depending on the CBG before the meal  - Levemir 100 units at bedtime  At last visit, we changed to: - Levemir 50 units x2 injections at bedtime. - Humalog 3x a day 15 min before a meal: - 20 units before a smaller meal - 26 units before a larger meal If you do not eat, you may need to inject ~10 units Humalog every 4 hours, keeping an eye on your sugars.  Pt checks her sugars 1-3 times a day: - am: 200-300, HI >> 165-387, 509, 540 (OJ) - 2h after b'fast: n/c - before lunch: 200s >> 161-250, 370 - 2h after lunch: n/c - before dinner: 200-300 >> 598 - 2h after dinner: n/c - bedtime: 200-300s >> 267 - nighttime: n/c Lowest sugar was 150 >> 161; ? hypoglycemia awareness. Highest sugar was HI >> 598  Glucometer: Accuchek  Pt's meals are: - Breakfast: eggs, bacon, oatmeal, grits - Lunch: salads - Dinner: meat + veggie or sandwich + salad or nabs - Snacks: nabs, fruit cups, jello  -Stage IV CKD (Dr. Juleen China), last BUN/creatinine:  Lab Results  Component Value Date   BUN 52 (H) 02/14/2017   BUN 39 (H) 11/25/2016   CREATININE 2.97 (H) 02/14/2017   CREATININE  2.63 (H) 11/25/2016  On  olmesartan 40 -She has HTG; last set of lipids: Lab Results  Component Value Date   CHOL 234 (H) 02/25/2017   HDL 38 (L) 02/25/2017   LDLCALC NOT CALC 05/19/2016   LDLDIRECT 107 05/19/2016   TRIG 596 (H) 02/25/2017   CHOLHDL 6.2 (H) 02/25/2017  On Zetia 10 and was previously on Crestor, now on Lipitor 40. - last eye exam was on 12/2015: No DR.  -No numbness but she has tingling in her feet.  She also has HTN.  ROS: Constitutional: no weight gain/no weight loss, no fatigue, no subjective hyperthermia, no subjective hypothermia Eyes: no blurry vision, no xerophthalmia ENT: no sore throat, no nodules palpated in throat, no dysphagia, no odynophagia, + hoarseness Cardiovascular: no CP/no SOB/no palpitations/no leg swelling Respiratory: + cough/no SOB/no wheezing Gastrointestinal: no N/no V/no D/no C/no acid reflux Musculoskeletal: no muscle aches/no joint aches Skin: no rashes, no hair loss Neurological: no tremors/no numbness/no tingling/no dizziness  I reviewed pt's medications, allergies, PMH, social hx, family hx, and changes were documented in the history of present illness. Otherwise, unchanged from my initial visit note.  Past Medical History:  Diagnosis Date  . Anemia   . Axillary masses    Soft tissue - status post excision  . Back pain   .  Chronic kidney disease   . Essential hypertension   . Ischemic heart disease    Abnormal Myoview April 2018 - medical therapy  . Mixed hyperlipidemia   . Obesity   . Sleep apnea    Noncompliant with CPAP  . Type 2 diabetes mellitus, uncontrolled ([place])    Past Surgical History:  Procedure Laterality Date  . ABDOMINAL HYSTERECTOMY    . COLONOSCOPY  2008   Dr. Oneida Alar: normal   . COLONOSCOPY N/A 12/18/2016   Dr. Oneida Alar: multiple tubular adenomas, internal hemorrhoids. Surveillance in 3 years   . ESOPHAGEAL DILATION N/A 10/13/2015   Procedure: ESOPHAGEAL DILATION;  Surgeon: Rogene Houston, MD;   Location: [facility];  Service: Endoscopy;  Laterality: N/A;  . ESOPHAGOGASTRODUODENOSCOPY N/A 10/13/2015   Dr. Laural Golden: chronic gastritis on path, no H.pylori. Empiric dilation   . ESOPHAGOGASTRODUODENOSCOPY N/A 12/18/2016   Dr. Oneida Alar: mild gastritis. BRAVO study revealed uncontrolled GERD. Dysphagia secondary to uncontrolled reflux  . FOOT SURGERY Bilateral   . LUNG BIOPSY    . MASS EXCISION Right 01/09/2013   Procedure: EXCISION OF NEOPLASM OF RIGHT  AXILLA  AND EXCISION OF NEOPLASM OF LEFT AXILLA;  Surgeon: Jamesetta So, MD;  Location: [facility] OR[facility];  Service: General;  Laterality: Right;  procedure end @ 08:23  . SAVORY DILATION N/A 12/18/2016   Procedure: SAVORY DILATION;  Surgeon: Danie Binder, MD;  Location: [facility];  Service: Endoscopy;  Laterality: N/A;   Social History   Socioeconomic History  . Marital status: Married    Spouse name: Not on file  . Number of children: 2  Occupational History  . CNA  Tobacco Use  . Smoking status: Never Smoker  . Smokeless tobacco: Never Used  Substance and Sexual Activity  . Alcohol use: No  . Drug use: No   Current Outpatient Medications on File Prior to Visit  Medication Sig Dispense Refill  . ACCU-CHEK FASTCLIX LANCETS MISC USE AS INSTRUCTED THREE  TIMES DAILY 306 each 5  . acetaminophen (TYLENOL) 500 MG tablet Take 1,000 mg by mouth every 6 (six) hours as needed for mild pain, moderate pain or headache.    . allopurinol (ZYLOPRIM) 300 MG tablet TAKE 1 TABLET (300 MG TOTAL) BY MOUTH DAILY. 30 tablet 3  . amLODipine (NORVASC) 10 MG tablet TAKE 1 TABLET BY MOUTH  DAILY 90 tablet 1  . atorvastatin (LIPITOR) 20 MG tablet Take 1 tablet (20 mg total) by mouth daily. 90 tablet 3  . azelastine (ASTELIN) 0.1 % nasal spray Place 2 sprays into both nostrils 2 (two) times daily. Use in each nostril as directed 30 mL 2  . calcitRIOL (ROCALTROL) 0.25 MCG capsule One capsule three times weekly ( Monday, Wednesday, Friday) 36 capsule 0  .  ciprofloxacin-dexamethasone (CIPRODEX) OTIC suspension Place 4 drops into both ears 2 (two) times daily.    . cloNIDine (CATAPRES) 0.1 MG tablet TAKE 1 TABLET BY MOUTH  EVERY EVENING AT 9PM FOR  BLOOD PRESSURE 90 tablet 1  . cyclobenzaprine (FLEXERIL) 10 MG tablet Take 1 tablet (10 mg total) by mouth at bedtime. 30 tablet 3  . dicyclomine (BENTYL) 10 MG capsule 1 po qachs if needed to prevent diarrhea and abdominal cramps 120 capsule 3  . ezetimibe (ZETIA) 10 MG tablet Take 1 tablet (10 mg total) by mouth daily. 30 tablet 5  . furosemide (LASIX) 40 MG tablet Take 1 tablet (40 mg total) by mouth daily. 7 tablet 0  . glucose blood (ACCU-CHEK AVIVA  PLUS) test strip USE 4 TIMES DAILY E11.65 300 each 2  . Insulin Detemir (LEVEMIR) 100 UNIT/ML Pen Inject 100 Units into the skin daily at 10 pm. 30 mL 11  . Insulin Lispro (HUMALOG KWIKPEN) 200 UNIT/ML SOPN Inject 20-26 Units 3 (three) times daily before meals into the skin. 10 pen 5  . Insulin Pen Needle (B-D UF III MINI PEN NEEDLES) 31G X 5 MM MISC USE AS INSTRUCTED THREE  TIMES DAILY 300 each 5  . labetalol (NORMODYNE) 200 MG tablet Take 2 tablets (400 mg total) by mouth 2 (two) times daily. 360 tablet 3  . montelukast (SINGULAIR) 10 MG tablet Take 10 mg by mouth at bedtime.    Marland Kitchen olmesartan (BENICAR) 40 MG tablet Take 1 tablet (40 mg total) by mouth daily. 90 tablet 1  . ondansetron (ZOFRAN) 4 MG tablet Take 1 tablet (4 mg total) by mouth every 8 (eight) hours as needed for nausea or vomiting. 30 tablet 1  . pantoprazole (PROTONIX) 40 MG tablet 1 PO 30 MINUTES PRIOR TO MEALS BID 60 tablet 11  . spironolactone (ALDACTONE) 25 MG tablet Take 1 tablet (25 mg total) by mouth 2 (two) times daily. 180 tablet 1  . triamterene-hydrochlorothiazide (MAXZIDE) 75-50 MG tablet Take 1 tablet by mouth daily. 90 tablet 1  . Vitamin D, Ergocalciferol, (DRISDOL) 50000 units CAPS capsule TAKE 1 CAPSULE (50,000 UNITS TOTAL) BY MOUTH EVERY 7 (SEVEN) DAYS. 4 capsule 0  .  [DISCONTINUED] FLUoxetine (PROZAC) 10 MG capsule Take 10 mg by mouth daily.      . [DISCONTINUED] glipiZIDE (GLUCOTROL) 10 MG tablet Take 10 mg by mouth 2 (two) times daily before a meal.       No current facility-administered medications on file prior to visit.    Allergies  Allergen Reactions  . Ace Inhibitors Anaphylaxis and Swelling  . Penicillins Itching and Swelling    Has patient had a PCN reaction causing immediate rash, facial/tongue/throat swelling, SOB or lightheadedness with hypotension: Yes Has patient had a PCN reaction causing severe rash involving mucus membranes or skin necrosis: No Has patient had a PCN reaction that required hospitalization No Has patient had a PCN reaction occurring within the last 10 years: No If all of the above answers are "NO", then may proceed with Cephalosporin use.   . Statins Other (See Comments)    elevated LFT's   Family History  Problem Relation Age of Onset  . Hypertension Father   . Hypercholesterolemia Father   . Arthritis Father   . Hypertension Sister   . Hypercholesterolemia Sister   . Breast cancer Sister   . Hypertension Sister   . Colon cancer Neg Hx   . Colon polyps Neg Hx     PE: BP (!) 180/90   Pulse 100   Ht 5\' 4"  (1.626 m)   Wt 208 lb (94.3 kg)   SpO2 95%   BMI 35.70 kg/m  Wt Readings from Last 3 Encounters:  03/19/17 208 lb (94.3 kg)  02/15/17 206 lb (93.4 kg)  02/14/17 203 lb (92.1 kg)   Constitutional: Obese, in NAD Eyes: PERRLA, EOMI, no exophthalmos ENT: moist mucous membranes, no thyromegaly, no cervical lymphadenopathy Cardiovascular: RRR, No MRG Respiratory: CTA B Gastrointestinal: abdomen soft, NT, ND, BS+ Musculoskeletal: no deformities, strength intact in all 4 Skin: moist, warm, no rashes Neurological: no tremor with outstretched hands, DTR normal in all 4  ASSESSMENT: 1. DM2, insulin-dependent, uncontrolled, with complications - CKD stage 4 - PN  2. HL  PLAN:  1. Patient with  long-standing, uncontrolled, diabetes, previously on premixed insulin and long-acting insulin, changed at last visit to basal-bolus insulin regimen.  She was taking the entire long-acting insulin dosing one injection and we discussed that this is not conducive to good absorption from the injection site so we split this injection into.  We also designed a more flexible regimen for her Humalog, adjusted depending on the size of her meals we also discussed at length about improving her diet..  - sugars improved after we made the above changes, but she now has a URI for the last week >> sugars higher. She is also hydrating with OJ >> advised to stop. She is not eating well and not injecting insulin but we discussed about the need to bolus Humalog q4h if sugars high as she does not eat. - I suggested to:  Patient Instructions  Please continue: - Levemir 50 units x2 injections at bedtime.  Please increase - Humalog 3x a day 15 min before a meal: - 26 units before a smaller meal - 30 units before a regular meal - 34 units before a larger meal  If you do not eat, you may need to inject ~10-15 units Humalog every 4 hours, keeping an eye on your sugars.  No juice!  Please return in 3 months with your sugar log.   - today, HbA1c is 11.5% (slightly better) - continue checking sugars at different times of the day - check 3x a day, rotating checks - advised for yearly eye exams >> she is UTD - Return to clinic in 3 mo with sugar log   2. HL - Reviewed latest lipid panel from earlier this month: Triglycerides very high,   HDL low, LDL could not be calculated - Continues on Zetia and Lipitor.  No side effects. - Discussed the importance of a low-fat diet, also low in concentrated sweets.  [name], MD PhD [facility]

## 2017-03-19 NOTE — Addendum Note (Signed)
Addended by: Drucilla Schmidt on: 03/19/2017 01:57 PM   Modules accepted: Orders

## 2017-03-19 NOTE — Patient Instructions (Addendum)
Please continue: - Levemir 50 units x2 injections at bedtime.  Please increase - Humalog 3x a day 15 min before a meal: - 26 units before a smaller meal - 30 units before a regular meal - 34 units before a larger meal  If you do not eat, you may need to inject ~10-15 units Humalog every 4 hours, keeping an eye on your sugars.  No juice!  Please return in 3 months with your sugar log.

## 2017-03-22 ENCOUNTER — Telehealth: Payer: Self-pay

## 2017-03-22 NOTE — Telephone Encounter (Signed)
Right foot gout flare x 2 weeks. Hurts to put pressure on her right foot. Wants med sent to [facility]

## 2017-03-22 NOTE — Telephone Encounter (Signed)
Pt is requesting Dr Moshe Cipro call in something for gout today is any way possible.

## 2017-03-23 DIAGNOSIS — Z8701 Personal history of pneumonia (recurrent): Secondary | ICD-10-CM

## 2017-03-23 HISTORY — DX: Personal history of pneumonia (recurrent): Z87.01

## 2017-03-24 ENCOUNTER — Emergency Department (HOSPITAL_COMMUNITY): Payer: 59

## 2017-03-24 ENCOUNTER — Encounter (HOSPITAL_COMMUNITY): Payer: Self-pay | Admitting: Emergency Medicine

## 2017-03-24 ENCOUNTER — Other Ambulatory Visit: Payer: Self-pay

## 2017-03-24 ENCOUNTER — Other Ambulatory Visit: Payer: Self-pay | Admitting: Family Medicine

## 2017-03-24 ENCOUNTER — Inpatient Hospital Stay (HOSPITAL_COMMUNITY)
Admission: EM | Admit: 2017-03-24 | Discharge: 2017-03-31 | DRG: 439 | Disposition: A | Discharge status: Home / Self Care | Payer: 59 | Attending: Family Medicine | Admitting: Family Medicine

## 2017-03-24 DIAGNOSIS — Z6835 Body mass index (BMI) 35.0-35.9, adult: Secondary | ICD-10-CM | POA: Diagnosis not present

## 2017-03-24 DIAGNOSIS — K8500 Idiopathic acute pancreatitis without necrosis or infection: Secondary | ICD-10-CM | POA: Diagnosis not present

## 2017-03-24 DIAGNOSIS — E1121 Type 2 diabetes mellitus with diabetic nephropathy: Secondary | ICD-10-CM

## 2017-03-24 DIAGNOSIS — Z79899 Other long term (current) drug therapy: Secondary | ICD-10-CM | POA: Diagnosis not present

## 2017-03-24 DIAGNOSIS — G4733 Obstructive sleep apnea (adult) (pediatric): Secondary | ICD-10-CM | POA: Diagnosis present

## 2017-03-24 DIAGNOSIS — K859 Acute pancreatitis without necrosis or infection, unspecified: Secondary | ICD-10-CM | POA: Diagnosis not present

## 2017-03-24 DIAGNOSIS — D509 Iron deficiency anemia, unspecified: Secondary | ICD-10-CM | POA: Diagnosis present

## 2017-03-24 DIAGNOSIS — E1165 Type 2 diabetes mellitus with hyperglycemia: Secondary | ICD-10-CM | POA: Diagnosis present

## 2017-03-24 DIAGNOSIS — N186 End stage renal disease: Secondary | ICD-10-CM

## 2017-03-24 DIAGNOSIS — D649 Anemia, unspecified: Secondary | ICD-10-CM | POA: Diagnosis present

## 2017-03-24 DIAGNOSIS — I2510 Atherosclerotic heart disease of native coronary artery without angina pectoris: Secondary | ICD-10-CM | POA: Diagnosis present

## 2017-03-24 DIAGNOSIS — G4730 Sleep apnea, unspecified: Secondary | ICD-10-CM | POA: Diagnosis present

## 2017-03-24 DIAGNOSIS — K219 Gastro-esophageal reflux disease without esophagitis: Secondary | ICD-10-CM | POA: Diagnosis present

## 2017-03-24 DIAGNOSIS — E1122 Type 2 diabetes mellitus with diabetic chronic kidney disease: Secondary | ICD-10-CM | POA: Diagnosis present

## 2017-03-24 DIAGNOSIS — I129 Hypertensive chronic kidney disease with stage 1 through stage 4 chronic kidney disease, or unspecified chronic kidney disease: Secondary | ICD-10-CM | POA: Diagnosis present

## 2017-03-24 DIAGNOSIS — I10 Essential (primary) hypertension: Secondary | ICD-10-CM | POA: Diagnosis not present

## 2017-03-24 DIAGNOSIS — N184 Chronic kidney disease, stage 4 (severe): Secondary | ICD-10-CM | POA: Diagnosis present

## 2017-03-24 DIAGNOSIS — Z794 Long term (current) use of insulin: Secondary | ICD-10-CM | POA: Diagnosis not present

## 2017-03-24 DIAGNOSIS — K210 Gastro-esophageal reflux disease with esophagitis: Secondary | ICD-10-CM | POA: Diagnosis not present

## 2017-03-24 DIAGNOSIS — D508 Other iron deficiency anemias: Secondary | ICD-10-CM | POA: Diagnosis not present

## 2017-03-24 DIAGNOSIS — E782 Mixed hyperlipidemia: Secondary | ICD-10-CM | POA: Diagnosis present

## 2017-03-24 DIAGNOSIS — K589 Irritable bowel syndrome without diarrhea: Secondary | ICD-10-CM | POA: Diagnosis present

## 2017-03-24 DIAGNOSIS — D638 Anemia in other chronic diseases classified elsewhere: Secondary | ICD-10-CM | POA: Diagnosis present

## 2017-03-24 DIAGNOSIS — R1013 Epigastric pain: Secondary | ICD-10-CM | POA: Diagnosis present

## 2017-03-24 DIAGNOSIS — E669 Obesity, unspecified: Secondary | ICD-10-CM | POA: Diagnosis present

## 2017-03-24 DIAGNOSIS — E1143 Type 2 diabetes mellitus with diabetic autonomic (poly)neuropathy: Secondary | ICD-10-CM | POA: Diagnosis present

## 2017-03-24 DIAGNOSIS — K3184 Gastroparesis: Secondary | ICD-10-CM | POA: Diagnosis present

## 2017-03-24 DIAGNOSIS — J302 Other seasonal allergic rhinitis: Secondary | ICD-10-CM | POA: Diagnosis present

## 2017-03-24 LAB — URINALYSIS, ROUTINE W REFLEX MICROSCOPIC
BILIRUBIN URINE: NEGATIVE
Glucose, UA: >=500 mg/dL — AB
HGB URINE DIPSTICK: NEGATIVE
KETONES UR: NEGATIVE mg/dL
LEUKOCYTES UA: NEGATIVE
NITRITE: NEGATIVE
Protein, ur: >=300 mg/dL — AB
Specific Gravity, Urine: 1.022 (ref 1.005–1.030)
pH: 5 (ref 5.0–8.0)

## 2017-03-24 LAB — CBC WITH DIFFERENTIAL/PLATELET
Basophils Absolute: 0 10*3/uL (ref 0.0–0.1)
Basophils Relative: 0 %
Eosinophils Absolute: 0.1 10*3/uL (ref 0.0–0.7)
Eosinophils Relative: 1 %
HEMATOCRIT: 32.9 % — AB (ref 36.0–46.0)
HEMOGLOBIN: 10 g/dL — AB (ref 12.0–15.0)
LYMPHS ABS: 1.9 10*3/uL (ref 0.7–4.0)
Lymphocytes Relative: 20 %
MCH: 22.4 pg — AB (ref 26.0–34.0)
MCHC: 30.4 g/dL (ref 30.0–36.0)
MCV: 73.6 fL — ABNORMAL LOW (ref 78.0–100.0)
MONOS PCT: 8 %
Monocytes Absolute: 0.7 10*3/uL (ref 0.1–1.0)
NEUTROS ABS: 6.9 10*3/uL (ref 1.7–7.7)
NEUTROS PCT: 71 %
Platelets: 379 10*3/uL (ref 150–400)
RBC: 4.47 MIL/uL (ref 3.87–5.11)
RDW: 16 % — ABNORMAL HIGH (ref 11.5–15.5)
WBC: 9.6 10*3/uL (ref 4.0–10.5)

## 2017-03-24 LAB — IRON AND TIBC
IRON: 28 ug/dL (ref 28–170)
SATURATION RATIOS: 12 % (ref 10.4–31.8)
TIBC: 232 ug/dL — AB (ref 250–450)
UIBC: 204 ug/dL

## 2017-03-24 LAB — COMPREHENSIVE METABOLIC PANEL
ALT: 21 U/L (ref 14–54)
ANION GAP: 12 (ref 5–15)
AST: 14 U/L — ABNORMAL LOW (ref 15–41)
Albumin: 2.5 g/dL — ABNORMAL LOW (ref 3.5–5.0)
Alkaline Phosphatase: 106 U/L (ref 38–126)
BUN: 33 mg/dL — ABNORMAL HIGH (ref 6–20)
CHLORIDE: 104 mmol/L (ref 101–111)
CO2: 21 mmol/L — AB (ref 22–32)
Calcium: 9 mg/dL (ref 8.9–10.3)
Creatinine, Ser: 2.38 mg/dL — ABNORMAL HIGH (ref 0.44–1.00)
GFR calc non Af Amer: 21 mL/min — ABNORMAL LOW (ref >60)
GFR, EST AFRICAN AMERICAN: 24 mL/min — AB (ref >60)
Glucose, Bld: 374 mg/dL — ABNORMAL HIGH (ref 65–99)
POTASSIUM: 4.4 mmol/L (ref 3.5–5.1)
SODIUM: 137 mmol/L (ref 135–145)
Total Bilirubin: 0.5 mg/dL (ref 0.3–1.2)
Total Protein: 6.7 g/dL (ref 6.5–8.1)

## 2017-03-24 LAB — TROPONIN I

## 2017-03-24 LAB — LIPASE, BLOOD: Lipase: 107 U/L — ABNORMAL HIGH (ref 11–51)

## 2017-03-24 LAB — TRIGLYCERIDES: Triglycerides: 414 mg/dL — ABNORMAL HIGH (ref <150)

## 2017-03-24 LAB — GLUCOSE, CAPILLARY: Glucose-Capillary: 299 mg/dL — ABNORMAL HIGH (ref 65–99)

## 2017-03-24 MED ORDER — DICYCLOMINE HCL 10 MG PO CAPS
10.0000 mg | ORAL_CAPSULE | Freq: Three times a day (TID) | ORAL | Status: DC
  Administered 2017-03-24 – 2017-03-31 (×25): 10 mg via ORAL
  Filled 2017-03-24 (×25): qty 1

## 2017-03-24 MED ORDER — INSULIN DETEMIR 100 UNIT/ML ~~LOC~~ SOLN
100.0000 [IU] | Freq: Every day | SUBCUTANEOUS | Status: DC
  Administered 2017-03-24: 50 [IU] via SUBCUTANEOUS
  Administered 2017-03-25 – 2017-03-27 (×3): 100 [IU] via SUBCUTANEOUS
  Filled 2017-03-24 (×5): qty 1

## 2017-03-24 MED ORDER — ACETAMINOPHEN 650 MG RE SUPP: 650.0000 mg | Freq: Four times a day (QID) | RECTAL | Status: DC | PRN

## 2017-03-24 MED ORDER — INSULIN ASPART 100 UNIT/ML ~~LOC~~ SOLN
0.0000 [IU] | SUBCUTANEOUS | Status: DC
  Administered 2017-03-24: 11 [IU] via SUBCUTANEOUS
  Administered 2017-03-25: 5 [IU] via SUBCUTANEOUS
  Administered 2017-03-25: 7 [IU] via SUBCUTANEOUS
  Administered 2017-03-25: 5 [IU] via SUBCUTANEOUS
  Administered 2017-03-25: 3 [IU] via SUBCUTANEOUS
  Administered 2017-03-25 (×2): 7 [IU] via SUBCUTANEOUS
  Administered 2017-03-26 (×2): 4 [IU] via SUBCUTANEOUS
  Administered 2017-03-26 (×2): 3 [IU] via SUBCUTANEOUS
  Administered 2017-03-27: 4 [IU] via SUBCUTANEOUS
  Administered 2017-03-27: 3 [IU] via SUBCUTANEOUS
  Administered 2017-03-27: 4 [IU] via SUBCUTANEOUS
  Administered 2017-03-28 – 2017-03-29 (×2): 3 [IU] via SUBCUTANEOUS

## 2017-03-24 MED ORDER — ONDANSETRON HCL 4 MG PO TABS: 4.0000 mg | ORAL_TABLET | Freq: Four times a day (QID) | ORAL | Status: DC | PRN

## 2017-03-24 MED ORDER — HYDROMORPHONE HCL 1 MG/ML IJ SOLN
1.0000 mg | Freq: Once | INTRAMUSCULAR | Status: AC
Start: 2017-03-24 — End: 2017-03-24
  Administered 2017-03-24: 1 mg via INTRAVENOUS
  Filled 2017-03-24: qty 1

## 2017-03-24 MED ORDER — HYDRALAZINE HCL 20 MG/ML IJ SOLN: 10.0000 mg | Freq: Four times a day (QID) | INTRAMUSCULAR | Status: DC | PRN

## 2017-03-24 MED ORDER — IRBESARTAN 300 MG PO TABS
300.0000 mg | ORAL_TABLET | Freq: Every day | ORAL | Status: DC
  Administered 2017-03-24 – 2017-03-31 (×8): 300 mg via ORAL
  Filled 2017-03-24 (×8): qty 1

## 2017-03-24 MED ORDER — SODIUM CHLORIDE 0.9 % IV SOLN
INTRAVENOUS | Status: DC
  Administered 2017-03-24 – 2017-03-30 (×15): via INTRAVENOUS

## 2017-03-24 MED ORDER — HEPARIN SODIUM (PORCINE) 5000 UNIT/ML IJ SOLN
5000.0000 [IU] | Freq: Three times a day (TID) | INTRAMUSCULAR | Status: DC
  Administered 2017-03-24 – 2017-03-31 (×16): 5000 [IU] via SUBCUTANEOUS
  Filled 2017-03-24 (×16): qty 1

## 2017-03-24 MED ORDER — AZELASTINE HCL 0.1 % NA SOLN
2.0000 | Freq: Two times a day (BID) | NASAL | Status: DC | PRN
  Administered 2017-03-30 – 2017-03-31 (×2): 2 via NASAL
  Filled 2017-03-24: qty 30

## 2017-03-24 MED ORDER — LABETALOL HCL 200 MG PO TABS
400.0000 mg | ORAL_TABLET | Freq: Two times a day (BID) | ORAL | Status: DC
  Administered 2017-03-24 – 2017-03-31 (×14): 400 mg via ORAL
  Filled 2017-03-24 (×14): qty 2

## 2017-03-24 MED ORDER — CALCITRIOL 0.25 MCG PO CAPS
0.2500 ug | ORAL_CAPSULE | ORAL | Status: DC
  Administered 2017-03-26 – 2017-03-31 (×3): 0.25 ug via ORAL
  Filled 2017-03-24 (×3): qty 1

## 2017-03-24 MED ORDER — FUROSEMIDE 40 MG PO TABS
40.0000 mg | ORAL_TABLET | Freq: Every day | ORAL | Status: DC
  Administered 2017-03-24 – 2017-03-31 (×8): 40 mg via ORAL
  Filled 2017-03-24: qty 1
  Filled 2017-03-24: qty 2
  Filled 2017-03-24 (×4): qty 1
  Filled 2017-03-24: qty 2
  Filled 2017-03-24: qty 1

## 2017-03-24 MED ORDER — CYCLOBENZAPRINE HCL 10 MG PO TABS
10.0000 mg | ORAL_TABLET | Freq: Every day | ORAL | Status: DC
  Administered 2017-03-24 – 2017-03-30 (×7): 10 mg via ORAL
  Filled 2017-03-24 (×7): qty 1

## 2017-03-24 MED ORDER — PREDNISONE 5 MG PO TABS: 5.0000 mg | ORAL_TABLET | Freq: Two times a day (BID) | ORAL | 0 refills | Status: DC

## 2017-03-24 MED ORDER — EZETIMIBE 10 MG PO TABS
10.0000 mg | ORAL_TABLET | Freq: Every day | ORAL | Status: DC
  Administered 2017-03-24 – 2017-03-31 (×8): 10 mg via ORAL
  Filled 2017-03-24 (×8): qty 1

## 2017-03-24 MED ORDER — MORPHINE SULFATE (PF) 2 MG/ML IV SOLN
2.0000 mg | INTRAVENOUS | Status: DC | PRN
  Administered 2017-03-24 – 2017-03-28 (×6): 2 mg via INTRAVENOUS
  Filled 2017-03-24 (×6): qty 1

## 2017-03-24 MED ORDER — ISOSORBIDE MONONITRATE ER 60 MG PO TB24
30.0000 mg | ORAL_TABLET | Freq: Every day | ORAL | Status: DC
  Administered 2017-03-24 – 2017-03-31 (×8): 30 mg via ORAL
  Filled 2017-03-24 (×8): qty 1

## 2017-03-24 MED ORDER — MORPHINE SULFATE (PF) 4 MG/ML IV SOLN
4.0000 mg | Freq: Once | INTRAVENOUS | Status: AC
Start: 2017-03-24 — End: 2017-03-24
  Administered 2017-03-24: 4 mg via INTRAVENOUS
  Filled 2017-03-24: qty 1

## 2017-03-24 MED ORDER — MONTELUKAST SODIUM 10 MG PO TABS
10.0000 mg | ORAL_TABLET | Freq: Every day | ORAL | Status: DC
  Administered 2017-03-24 – 2017-03-30 (×7): 10 mg via ORAL
  Filled 2017-03-24 (×7): qty 1

## 2017-03-24 MED ORDER — ALLOPURINOL 300 MG PO TABS
300.0000 mg | ORAL_TABLET | Freq: Every day | ORAL | Status: DC
  Administered 2017-03-24 – 2017-03-31 (×8): 300 mg via ORAL
  Filled 2017-03-24 (×8): qty 1

## 2017-03-24 MED ORDER — ACETAMINOPHEN 325 MG PO TABS
650.0000 mg | ORAL_TABLET | Freq: Four times a day (QID) | ORAL | Status: DC | PRN
  Administered 2017-03-25: 650 mg via ORAL
  Filled 2017-03-24: qty 2

## 2017-03-24 MED ORDER — CLONIDINE HCL 0.1 MG PO TABS
0.1000 mg | ORAL_TABLET | ORAL | Status: DC
  Administered 2017-03-24 – 2017-03-30 (×7): 0.1 mg via ORAL
  Filled 2017-03-24 (×7): qty 1

## 2017-03-24 MED ORDER — ONDANSETRON HCL 4 MG/2ML IJ SOLN
4.0000 mg | Freq: Four times a day (QID) | INTRAMUSCULAR | Status: DC | PRN
  Administered 2017-03-25 – 2017-03-29 (×6): 4 mg via INTRAVENOUS
  Filled 2017-03-24 (×6): qty 2

## 2017-03-24 MED ORDER — ATORVASTATIN CALCIUM 20 MG PO TABS
20.0000 mg | ORAL_TABLET | Freq: Every day | ORAL | Status: DC
  Administered 2017-03-24 – 2017-03-30 (×6): 20 mg via ORAL
  Filled 2017-03-24 (×6): qty 1

## 2017-03-24 MED ORDER — CIPROFLOXACIN-DEXAMETHASONE 0.3-0.1 % OT SUSP
4.0000 [drp] | Freq: Two times a day (BID) | OTIC | Status: DC
  Administered 2017-03-24 – 2017-03-31 (×12): 4 [drp] via OTIC
  Filled 2017-03-24 (×2): qty 7.5

## 2017-03-24 MED ORDER — MORPHINE SULFATE (PF) 4 MG/ML IV SOLN
4.0000 mg | Freq: Once | INTRAVENOUS | Status: AC
  Administered 2017-03-24: 4 mg via INTRAVENOUS
  Filled 2017-03-24: qty 1

## 2017-03-24 MED ORDER — SODIUM CHLORIDE 0.9 % IV SOLN
INTRAVENOUS | Status: DC
  Administered 2017-03-24: 15:00:00 via INTRAVENOUS

## 2017-03-24 NOTE — ED Provider Notes (Signed)
[facility] EMERGENCY DEPARTMENT Provider Note   CSN: [id number] Arrival date & time: 03/24/17  7673     History   Chief Complaint Chief Complaint  Patient presents with  . Back Pain    HPI Kathryn Beck is a 62 y.o. female.  Planes of diffuse back pain and diffuse abdominal pain for the past 4 days.  She vomited one time yesterday she denies nausea present.  Nausea is exacerbated by eating.  No treatment prior to coming here.  Back pain is worse by changing positions.  Improved with remaining still.  Other associated symptoms include cough productive of green sputum for approximately 3 weeks.  She denies any shortness of breath.  She reports temperature of 100.3 approximately a week ago.  No treatment prior to coming here.  No other associated symptoms  HPI  Past Medical History:  Diagnosis Date  . Anemia   . Axillary masses    Soft tissue - status post excision  . Back pain   . Chronic kidney disease   . Essential hypertension   . Ischemic heart disease    Abnormal Myoview April 2018 - medical therapy  . Mixed hyperlipidemia   . Obesity   . Sleep apnea    Noncompliant with CPAP  . Type 2 diabetes mellitus, uncontrolled ([place])     Patient Active Problem List   Diagnosis Date Noted  . Acute right flank pain 03/02/2017  . Colicky RUQ abdominal pain 02/15/2017  . History of colonic polyps 02/10/2017  . Need for shingles vaccine 01/03/2017  . Dysphagia 11/05/2016  . IBS (irritable bowel syndrome) 02/04/2016  . Personal history of noncompliance with medical treatment, presenting hazards to health 10/21/2015  . Postprandial epigastric pain   . Heat sensitivity 10/07/2014  . Precordial pain 01/17/2014  . Cardiac murmur 01/17/2014  . Colon cancer screening 01/15/2014  . Hyperuricemia 08/04/2013  . Seasonal allergies 03/10/2013  . Muscle spasm 12/24/2012  . Lipoma of back 12/22/2012  . Nausea without vomiting 10/02/2012  . GERD (gastroesophageal reflux disease)  08/22/2012  . Sleep apnea 06/02/2011  . BACK PAIN WITH RADICULOPATHY 04/16/2010  . FATIGUE 07/24/2009  . LIVER FUNCTION TESTS, ABNORMAL, HX OF 05/08/2009  . Anemia, deficiency 06/05/2008  . LUPUS ERYTHEMATOSUS, DISCOID 06/05/2008  . Type 2 diabetes mellitus with stage 4 chronic kidney disease, with long-term current use of insulin ([place]) 06/07/2007  . Mixed hyperlipidemia 06/07/2007  . Obesity 06/07/2007  . Essential hypertension 06/07/2007    Past Surgical History:  Procedure Laterality Date  . ABDOMINAL HYSTERECTOMY    . COLONOSCOPY  2008   Dr. Oneida Alar: normal   . COLONOSCOPY N/A 12/18/2016   Dr. Oneida Alar: multiple tubular adenomas, internal hemorrhoids. Surveillance in 3 years   . ESOPHAGEAL DILATION N/A 10/13/2015   Procedure: ESOPHAGEAL DILATION;  Surgeon: Rogene Houston, MD;  Location: [facility] SUITE;  Service: Endoscopy;  Laterality: N/A;  . ESOPHAGOGASTRODUODENOSCOPY N/A 10/13/2015   Dr. Laural Golden: chronic gastritis on path, no H.pylori. Empiric dilation   . ESOPHAGOGASTRODUODENOSCOPY N/A 12/18/2016   Dr. Oneida Alar: mild gastritis. BRAVO study revealed uncontrolled GERD. Dysphagia secondary to uncontrolled reflux  . FOOT SURGERY Bilateral   . LUNG BIOPSY    . MASS EXCISION Right 01/09/2013   Procedure: EXCISION OF NEOPLASM OF RIGHT  AXILLA  AND EXCISION OF NEOPLASM OF LEFT AXILLA;  Surgeon: Jamesetta So, MD;  Location: [facility] ORS;  Service: General;  Laterality: Right;  procedure end @ 08:23  . SAVORY DILATION N/A 12/18/2016  Procedure: SAVORY DILATION;  Surgeon: Danie Binder, MD;  Location: [facility] SUITE;  Service: Endoscopy;  Laterality: N/A;    OB History    No data available       Home Medications    Prior to Admission medications   Medication Sig Start Date End Date Taking? Authorizing Provider  ACCU-CHEK FASTCLIX LANCETS MISC USE AS INSTRUCTED THREE  TIMES DAILY 03/05/16   Fayrene Helper, MD  acetaminophen (TYLENOL) 500 MG tablet Take 1,000 mg by mouth every 6 (six)  hours as needed for mild pain, moderate pain or headache.    [provider]  allopurinol (ZYLOPRIM) 300 MG tablet TAKE 1 TABLET (300 MG TOTAL) BY MOUTH DAILY. 10/28/15   Fayrene Helper, MD  amLODipine (NORVASC) 10 MG tablet TAKE 1 TABLET BY MOUTH  DAILY 03/05/16   Fayrene Helper, MD  atorvastatin (LIPITOR) 20 MG tablet Take 1 tablet (20 mg total) by mouth daily. 12/28/16   Fayrene Helper, MD  azelastine (ASTELIN) 0.1 % nasal spray Place 2 sprays into both nostrils 2 (two) times daily. Use in each nostril as directed 12/24/16   Fayrene Helper, MD  calcitRIOL (ROCALTROL) 0.25 MCG capsule One capsule three times weekly ( Monday, Wednesday, Friday) 09/15/16   Fayrene Helper, MD  ciprofloxacin-dexamethasone (CIPRODEX) OTIC suspension Place 4 drops into both ears 2 (two) times daily.    [provider]  cloNIDine (CATAPRES) 0.1 MG tablet TAKE 1 TABLET BY MOUTH  EVERY EVENING AT 9PM FOR  BLOOD PRESSURE 11/24/16   Fayrene Helper, MD  cyclobenzaprine (FLEXERIL) 10 MG tablet Take 1 tablet (10 mg total) by mouth at bedtime. 10/22/15   Fayrene Helper, MD  dicyclomine (BENTYL) 10 MG capsule 1 po qachs if needed to prevent diarrhea and abdominal cramps 12/18/16   Fields, Marga Melnick, MD  ezetimibe (ZETIA) 10 MG tablet Take 1 tablet (10 mg total) by mouth daily. 01/31/16   Fayrene Helper, MD  furosemide (LASIX) 40 MG tablet Take 1 tablet (40 mg total) by mouth daily. 09/15/16   Fayrene Helper, MD  glucose blood (ACCU-CHEK AVIVA PLUS) test strip USE 4 TIMES DAILY E11.65 09/07/16   Cassandria Anger, MD  Insulin Detemir (LEVEMIR) 100 UNIT/ML Pen Inject 100 Units into the skin daily at 10 pm. 03/19/17   [name], MD  Insulin Lispro (HUMALOG KWIKPEN) 200 UNIT/ML SOPN Inject 26-34 Units into the skin 3 (three) times daily before meals. 03/19/17   [name], MD  Insulin Pen Needle (B-D UF III MINI PEN NEEDLES) 31G X 5 MM MISC USE AS INSTRUCTED THREE   TIMES DAILY 03/19/17   [name], MD  labetalol (NORMODYNE) 200 MG tablet Take 2 tablets (400 mg total) by mouth 2 (two) times daily. 11/24/16   Fayrene Helper, MD  montelukast (SINGULAIR) 10 MG tablet Take 10 mg by mouth at bedtime.    [provider]  olmesartan (BENICAR) 40 MG tablet Take 1 tablet (40 mg total) by mouth daily. 11/24/16   Fayrene Helper, MD  ondansetron (ZOFRAN) 4 MG tablet Take 1 tablet (4 mg total) by mouth every 8 (eight) hours as needed for nausea or vomiting. 11/05/16   Annitta Needs, NP  pantoprazole (PROTONIX) 40 MG tablet 1 PO 30 MINUTES PRIOR TO MEALS BID 01/05/17   Fields, Marga Melnick, MD  spironolactone (ALDACTONE) 25 MG tablet Take 1 tablet (25 mg total) by mouth 2 (two) times daily. 11/24/16   Moshe Cipro,  Norwood Levo, MD  triamterene-hydrochlorothiazide (MAXZIDE) 75-50 MG tablet Take 1 tablet by mouth daily. 11/24/16   Fayrene Helper, MD  Vitamin D, Ergocalciferol, (DRISDOL) 50000 units CAPS capsule TAKE 1 CAPSULE (50,000 UNITS TOTAL) BY MOUTH EVERY 7 (SEVEN) DAYS. 03/02/17   Fayrene Helper, MD    Family History Family History  Problem Relation Age of Onset  . Hypertension Father   . Hypercholesterolemia Father   . Arthritis Father   . Hypertension Sister   . Hypercholesterolemia Sister   . Breast cancer Sister   . Hypertension Sister   . Colon cancer Neg Hx   . Colon polyps Neg Hx     Social History Social History   Tobacco Use  . Smoking status: Never Smoker  . Smokeless tobacco: Never Used  Substance Use Topics  . Alcohol use: No  . Drug use: No     Allergies   Ace inhibitors; Penicillins; and Statins   Review of Systems Review of Systems  Respiratory: Positive for cough.   Gastrointestinal: Positive for abdominal pain.  Musculoskeletal: Positive for back pain.       Walks with crutches presently.  Had attack of gout in her foot a few days ago.  No longer with foot pain  Allergic/Immunologic: Positive for  immunocompromised state.       Diabetic  Neurological: Positive for numbness.       Chronic tingling in both feet for the past 4 months  All other systems reviewed and are negative.    Physical Exam Updated Vital Signs BP (!) 218/93 (BP Location: Right Arm)   Pulse 84   Temp 98.3 F (36.8 C) (Oral)   Resp (!) 24   SpO2 99%   Physical Exam  Constitutional: She is oriented to person, place, and time. She appears well-developed and well-nourished.  HENT:  Head: Normocephalic and atraumatic.  Eyes: Conjunctivae are normal. Pupils are equal, round, and reactive to light.  Neck: Neck supple. No tracheal deviation present. No thyromegaly present.  Cardiovascular: Normal rate, regular rhythm and normal heart sounds.  No murmur heard. Pulmonary/Chest: Effort normal and breath sounds normal.  Abdominal: Soft. Bowel sounds are normal. She exhibits no distension. There is no tenderness.  Morbidly obese mild diffuse tenderness  Musculoskeletal: Normal range of motion. She exhibits no edema or tenderness.  entire spine nontender.  All 4 extremities without redness swelling or tenderness neurovascular intact.  Neurological: She is alert and oriented to person, place, and time. Coordination normal.  Skin: Skin is warm and dry. No rash noted.  Psychiatric: She has a normal mood and affect.  Nursing note and vitals reviewed.    ED Treatments / Results  Labs (all labs ordered are listed, but only abnormal results are displayed) Labs Reviewed  URINALYSIS, ROUTINE W REFLEX MICROSCOPIC  COMPREHENSIVE METABOLIC PANEL  CBC WITH DIFFERENTIAL/PLATELET  LIPASE, BLOOD  TROPONIN I    EKG  EKG Interpretation  Date/Time:  Wednesday March 24 2017 10:18:14 EST Ventricular Rate:  76 PR Interval:    QRS Duration: 85 QT Interval:  387 QTC Calculation: 436 R Axis:   35 Text Interpretation:  Sinus rhythm Low voltage, precordial leads Baseline wander in lead(s) I III aVL aVF No significant  change since last tracing Confirmed by Orlie Dakin [phone]) on 03/24/2017 10:22:01 AM      Results for orders placed or performed during the hospital encounter of 03/24/17  Urinalysis, Routine w reflex microscopic  Result Value Ref Range   Color, Urine  YELLOW YELLOW   APPearance HAZY (A) CLEAR   Specific Gravity, Urine 1.022 1.005 - 1.030   pH 5.0 5.0 - 8.0   Glucose, UA >=500 (A) NEGATIVE mg/dL   Hgb urine dipstick NEGATIVE NEGATIVE   Bilirubin Urine NEGATIVE NEGATIVE   Ketones, ur NEGATIVE NEGATIVE mg/dL   Protein, ur >=300 (A) NEGATIVE mg/dL   Nitrite NEGATIVE NEGATIVE   Leukocytes, UA NEGATIVE NEGATIVE   RBC / HPF 0-5 0 - 5 RBC/hpf   WBC, UA 0-5 0 - 5 WBC/hpf   Bacteria, UA RARE (A) NONE SEEN   Squamous Epithelial / LPF 0-5 (A) NONE SEEN   Mucus PRESENT   Comprehensive metabolic panel  Result Value Ref Range   Sodium 137 135 - 145 mmol/L   Potassium 4.4 3.5 - 5.1 mmol/L   Chloride 104 101 - 111 mmol/L   CO2 21 (L) 22 - 32 mmol/L   Glucose, Bld 374 (H) 65 - 99 mg/dL   BUN 33 (H) 6 - 20 mg/dL   Creatinine, Ser 2.38 (H) 0.44 - 1.00 mg/dL   Calcium 9.0 8.9 - 10.3 mg/dL   Total Protein 6.7 6.5 - 8.1 g/dL   Albumin 2.5 (L) 3.5 - 5.0 g/dL   AST 14 (L) 15 - 41 U/L   ALT 21 14 - 54 U/L   Alkaline Phosphatase 106 38 - 126 U/L   Total Bilirubin 0.5 0.3 - 1.2 mg/dL   GFR calc non Af Amer 21 (L) >60 mL/min   GFR calc Af Amer 24 (L) >60 mL/min   Anion gap 12 5 - 15  CBC with Differential/Platelet  Result Value Ref Range   WBC 9.6 4.0 - 10.5 K/uL   RBC 4.47 3.87 - 5.11 MIL/uL   Hemoglobin 10.0 (L) 12.0 - 15.0 g/dL   HCT 32.9 (L) 36.0 - 46.0 %   MCV 73.6 (L) 78.0 - 100.0 fL   MCH 22.4 (L) 26.0 - 34.0 pg   MCHC 30.4 30.0 - 36.0 g/dL   RDW 16.0 (H) 11.5 - 15.5 %   Platelets 379 150 - 400 K/uL   Neutrophils Relative % 71 %   Neutro Abs 6.9 1.7 - 7.7 K/uL   Lymphocytes Relative 20 %   Lymphs Abs 1.9 0.7 - 4.0 K/uL   Monocytes Relative 8 %   Monocytes Absolute 0.7 0.1 - 1.0  K/uL   Eosinophils Relative 1 %   Eosinophils Absolute 0.1 0.0 - 0.7 K/uL   Basophils Relative 0 %   Basophils Absolute 0.0 0.0 - 0.1 K/uL  Lipase, blood  Result Value Ref Range   Lipase 107 (H) 11 - 51 U/L  Troponin I  Result Value Ref Range   Troponin I <0.03 <0.03 ng/mL   Ct Abdomen Pelvis Wo Contrast  Result Date: 03/24/2017 CLINICAL DATA:  Back pain and vomiting. EXAM: CT ABDOMEN AND PELVIS WITHOUT CONTRAST TECHNIQUE: Multidetector CT imaging of the abdomen and pelvis was performed following the standard protocol without IV contrast. COMPARISON:  CT 10/10/2015 FINDINGS: Lower chest: Lung bases are clear. Hepatobiliary:  No focal hepatic lesion.  Gallbladder normal Pancreas: fullness in the pancreatic head measuring 4.0 x 4.7 cm (image 34, series 2). There is mild inflammatory stranding in the adjacent retroperitoneal fat. No organized fluid collections. The body and tail the pancreas normal. No duct dilatation. Spleen: Normal spleen Adrenals/urinary tract: Low-density enlargement of the LEFT adrenal gland consistent small benign adenoma. Similar finding in the RIGHT adrenal gland which is slightly higher density cannot  be fully characterized as benign adenoma but favor such. Kidneys ureters and bladder normal. Simple fluid attenuation cyst in the lower pole of the RIGHT kidney Stomach/Bowel: Stomach, duodenum, small-bowel and cecum normal. Appendix is normal. The colon and rectosigmoid colon are normal. Vascular/Lymphatic: Abdominal aorta is normal caliber. There is no retroperitoneal or periportal lymphadenopathy. No pelvic lymphadenopathy. Reproductive: Post hysterectomy Other: No free fluid. Musculoskeletal: No aggressive osseous lesion IMPRESSION: 1. New enlargement of the pancreatic head with peripancreatic inflammation is most consistent with focal pancreatitis. Recommend serum biochemical correlation. Additionally, recommend follow-up CT abdomen with contrast following resolution of acute  symptomology (with 1 to 2 months) to exclude unlikely underlying lesion. 2. Bilateral adrenal adenomas. Electronically Signed   By: Suzy Bouchard M.D.   On: 03/24/2017 13:33   Dg Chest 2 View  Result Date: 03/24/2017 CLINICAL DATA:  Cough.  Hypertension. EXAM: CHEST  2 VIEW COMPARISON:  02/14/2017 FINDINGS: The heart size and mediastinal contours are within normal limits. Scarring identified within the left base. The visualized skeletal structures are unremarkable. IMPRESSION: No active cardiopulmonary disease. Electronically Signed   By: Kerby Moors M.D.   On: 03/24/2017 11:17   Radiology No results found.  Procedures Procedures (including critical care time)  Medications Ordered in ED Medications  morphine 4 MG/ML injection 4 mg (not administered)     Initial Impression / Assessment and Plan / ED Course  I have reviewed the triage vital signs and the nursing notes.  Pertinent labs & imaging results that were available during my care of the patient were reviewed by me and considered in my medical decision making (see chart for details).     10:30 AM pain improved after treatment with intravenous morphine 2:10 p.m. requesting additional pain medicine.  Intravenous hydromorphone ordered.  As well as intravenous normal saline drip hospitalist service consulted for admission.  Renal insufficiency is chronic.  I consulted Dr.Nishant from hospitalist service will arrange for overnight stay.  Patient should remain n.p.o. Final Clinical Impressions(s) / ED Diagnoses  Dx is #1 acute pancreatitis #2 hyperglycemia #3 chronic renal insufficiency Final diagnoses:  None    ED Discharge Orders    None       Orlie Dakin, MD 03/24/17 1520

## 2017-03-24 NOTE — Telephone Encounter (Signed)
I left a message on the listed home phone today that medication was sent to her pharmacy ([facility]), I have not been able to speak with her , 5 days prednisone sent, I said to call  And ask for Velna Hatchet if she has questions

## 2017-03-24 NOTE — ED Notes (Signed)
ED Provider at bedside. 

## 2017-03-24 NOTE — Progress Notes (Signed)
pred 

## 2017-03-24 NOTE — ED Triage Notes (Signed)
Pain off an on since Christmas.  Hurts to turn to left and right.  No radiation of pain, no pain on urination

## 2017-03-24 NOTE — ED Notes (Signed)
Patient to CT.

## 2017-03-24 NOTE — ED Notes (Signed)
Pt informed of need for urine sample, pt states she cannot provide one at this time due to pain. Informed pt that we could wait until after she has been given pain medication for sample.

## 2017-03-24 NOTE — H&P (Signed)
TRH H&P   Patient Demographics:    Neenah Canter, is a 62 y.o. female  MRN: [id number]   DOB - [birth year]  Admit Date - 03/24/2017  Outpatient Primary MD for the patient is Fayrene Helper, MD  Referring MD: Dr. Cathleen Fears  Outpatient Specialists:  Dr. Oneida Alar (GI)  Dr. Domenic Polite (cardiology)  Patient coming from: Home  Chief Complaint  Patient presents with  . Back Pain      HPI:    Yuette Putnam  is a 62 y.o. female, with history of IBS,? 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 Patient reports sharp epigastric pain frequently radiating to the mid back worsened with food intake. Symptoms have been progressive in the past 2 weeks associated with nausea and 2 episodes of vomiting yesterday. She denies any fevers, chills, headache, blurred vision, dizziness, chest pain, shortness of breath, palpitations, diarrhea, dysuria, muscle or joint aches. Denies weight loss. Patient reports having ongoing GERD symptoms for which she's had EGD and BRAVO capsule study showed mild gastritis only. She has been referred to [place] for ongoing GERD symptoms (has appointment on 1/25). Patient denies use of alcohol, recent change in her medications or NSAIDs use. Course in the ED Patient was hypertensive with blood pressure as high as 218/93 mmHg, blood work showed normal WBC, hemoglobin of 10, chemistry showed BUN of 33 and creatinine of 2.38 (around baseline), normal LFTs and mildly elevated lipase of 107. Blood glucose was elevated to  374.  CT of the abdomen and pelvis without contrast showed enlargement of the pancreatic head with prevent getting inflammation consistent with focal pancreatitis.  Patient given IV fluids and IV Dilaudid. Hospitalists consulted for admission to medical floor.   Review of systems:    In addition to the HPI above, (positive symptoms in bold) No Fever-chills, No Headache, No changes with Vision or hearing, No problems swallowing food or Liquids, No Chest pain, Cough or Shortness of Breath, Epigastric abdominal pain, nausea and vomiting, Bowel movements are regular, No Blood in stool or Urine, No dysuria, No new skin rashes or bruises, No new joints pains-aches,  No new weakness, tingling, numbness in any extremity, No recent weight gain or loss, No polyuria, polydypsia or polyphagia, No significant Mental Stressors.    With Past History of the following :    Past Medical History:  Diagnosis Date  . Anemia   . Axillary masses    Soft tissue - status post excision  . Back pain   . Chronic kidney disease   . Essential hypertension   . Ischemic heart  disease    Abnormal Myoview April 2018 - medical therapy  . Mixed hyperlipidemia   . Obesity   . Sleep apnea    Noncompliant with CPAP  . Type 2 diabetes mellitus, uncontrolled ([place])       Past Surgical History:  Procedure Laterality Date  . ABDOMINAL HYSTERECTOMY    . COLONOSCOPY  2008   Dr. Oneida Alar: normal   . COLONOSCOPY N/A 12/18/2016   Dr. Oneida Alar: multiple tubular adenomas, internal hemorrhoids. Surveillance in 3 years   . ESOPHAGEAL DILATION N/A 10/13/2015   Procedure: ESOPHAGEAL DILATION;  Surgeon: Rogene Houston, MD;  Location: [facility];  Service: Endoscopy;  Laterality: N/A;  . ESOPHAGOGASTRODUODENOSCOPY N/A 10/13/2015   Dr. Laural Golden: chronic gastritis on path, no H.pylori. Empiric dilation   . ESOPHAGOGASTRODUODENOSCOPY N/A 12/18/2016   Dr. Oneida Alar: mild gastritis. BRAVO study revealed uncontrolled GERD.  Dysphagia secondary to uncontrolled reflux  . FOOT SURGERY Bilateral   . LUNG BIOPSY    . MASS EXCISION Right 01/09/2013   Procedure: EXCISION OF NEOPLASM OF RIGHT  AXILLA  AND EXCISION OF NEOPLASM OF LEFT AXILLA;  Surgeon: Jamesetta So, MD;  Location: [facility];  Service: General;  Laterality: Right;  procedure end @ 08:23  . SAVORY DILATION N/A 12/18/2016   Procedure: SAVORY DILATION;  Surgeon: Danie Binder, MD;  Location: [facility];  Service: Endoscopy;  Laterality: N/A;      Social History:     Social History   Tobacco Use  . Smoking status: Never Smoker  . Smokeless tobacco: Never Used  Substance Use Topics  . Alcohol use: No     Lives - home  Mobility - independent     Family History :     Family History  Problem Relation Age of Onset  . Hypertension Father   . Hypercholesterolemia Father   . Arthritis Father   . Hypertension Sister   . Hypercholesterolemia Sister   . Breast cancer Sister   . Hypertension Sister   . Colon cancer Neg Hx   . Colon polyps Neg Hx      Home Medications:   Prior to Admission medications   Medication Sig Start Date End Date Taking? Authorizing Provider  acetaminophen (TYLENOL) 325 MG tablet Take 500 mg by mouth daily as needed for moderate pain.   Yes [provider]  allopurinol (ZYLOPRIM) 300 MG tablet TAKE 1 TABLET (300 MG TOTAL) BY MOUTH DAILY. 10/28/15  Yes Fayrene Helper, MD  amLODipine (NORVASC) 10 MG tablet TAKE 1 TABLET BY MOUTH  DAILY 03/05/16  Yes Fayrene Helper, MD  atorvastatin (LIPITOR) 20 MG tablet Take 1 tablet (20 mg total) by mouth daily. 12/28/16  Yes Fayrene Helper, MD  azelastine (ASTELIN) 0.1 % nasal spray Place 2 sprays into both nostrils 2 (two) times daily. Use in each nostril as directed Patient taking differently: Place 2 sprays into both nostrils 2 (two) times daily as needed for allergies. Use in each nostril as directed 12/24/16  Yes Fayrene Helper, MD  calcitRIOL  (ROCALTROL) 0.25 MCG capsule One capsule three times weekly ( Monday, Wednesday, Friday) 09/15/16  Yes Fayrene Helper, MD  ciprofloxacin-dexamethasone (CIPRODEX) OTIC suspension Place 4 drops into both ears 2 (two) times daily.   Yes [provider]  cloNIDine (CATAPRES) 0.1 MG tablet TAKE 1 TABLET BY MOUTH  EVERY EVENING AT 9PM FOR  BLOOD PRESSURE 11/24/16  Yes Fayrene Helper, MD  cyclobenzaprine (FLEXERIL) 10 MG tablet Take 1  tablet (10 mg total) by mouth at bedtime. 10/22/15  Yes Fayrene Helper, MD  dicyclomine (BENTYL) 10 MG capsule 1 po qachs if needed to prevent diarrhea and abdominal cramps 12/18/16  Yes Fields, Marga Melnick, MD  ezetimibe (ZETIA) 10 MG tablet Take 1 tablet (10 mg total) by mouth daily. 01/31/16  Yes Fayrene Helper, MD  furosemide (LASIX) 40 MG tablet Take 1 tablet (40 mg total) by mouth daily. 09/15/16  Yes Fayrene Helper, MD  Insulin Detemir (LEVEMIR) 100 UNIT/ML Pen Inject 100 Units into the skin daily at 10 pm. 03/19/17  Yes [name], MD  Insulin Lispro (HUMALOG KWIKPEN) 200 UNIT/ML SOPN Inject 26-34 Units into the skin 3 (three) times daily before meals. 03/19/17  Yes [name], MD  labetalol (NORMODYNE) 200 MG tablet Take 2 tablets (400 mg total) by mouth 2 (two) times daily. 11/24/16  Yes Fayrene Helper, MD  montelukast (SINGULAIR) 10 MG tablet Take 10 mg by mouth at bedtime.   Yes [provider]  olmesartan (BENICAR) 40 MG tablet Take 1 tablet (40 mg total) by mouth daily. 11/24/16  Yes Fayrene Helper, MD  pantoprazole (PROTONIX) 40 MG tablet 1 PO 30 MINUTES PRIOR TO MEALS BID 01/05/17  Yes Fields, Sandi L, MD  Phenyleph-Doxylamine-DM-APAP (ALKA-SELTZER PLS ALLERGY & [facility] PO) Take 2 tablets by mouth daily as needed (cold).   Yes [provider]  pseudoephedrine (SUDAFED) 30 MG tablet Take 30 mg by mouth daily as needed for congestion.   Yes [provider]  spironolactone (ALDACTONE) 25 MG tablet  Take 1 tablet (25 mg total) by mouth 2 (two) times daily. 11/24/16  Yes Fayrene Helper, MD  triamterene-hydrochlorothiazide (MAXZIDE) 75-50 MG tablet Take 1 tablet by mouth daily. 11/24/16  Yes Fayrene Helper, MD  Vitamin D, Ergocalciferol, (DRISDOL) 50000 units CAPS capsule TAKE 1 CAPSULE (50,000 UNITS TOTAL) BY MOUTH EVERY 7 (SEVEN) DAYS. Patient taking differently: Take 50,000 Units by mouth every 7 (seven) days. Take on Mondays. 03/02/17  Yes Fayrene Helper, MD  VITAMIN E PO Take 1 tablet by mouth daily.   Yes [provider]  ACCU-CHEK FASTCLIX LANCETS MISC USE AS INSTRUCTED THREE  TIMES DAILY 03/05/16   Fayrene Helper, MD  glucose blood (ACCU-CHEK AVIVA PLUS) test strip USE 4 TIMES DAILY E11.65 09/07/16   Cassandria Anger, MD  Insulin Pen Needle (B-D UF III MINI PEN NEEDLES) 31G X 5 MM MISC USE AS INSTRUCTED THREE  TIMES DAILY 03/19/17   [name], MD  predniSONE (DELTASONE) 5 MG tablet Take 1 tablet (5 mg total) by mouth 2 (two) times daily for 4 days. 03/24/17 03/28/17  Fayrene Helper, MD     Allergies:     Allergies  Allergen Reactions  . Ace Inhibitors Anaphylaxis and Swelling  . Penicillins Itching and Swelling    Has patient had a PCN reaction causing immediate rash, facial/tongue/throat swelling, SOB or lightheadedness with hypotension: Yes Has patient had a PCN reaction causing severe rash involving mucus membranes or skin necrosis: No Has patient had a PCN reaction that required hospitalization No Has patient had a PCN reaction occurring within the last 10 years: No If all of the above answers are "NO", then may proceed with Cephalosporin use.   . Statins Other (See Comments)    elevated LFT's     Physical Exam:   Vitals  Blood pressure (!) 171/94, pulse 79, temperature 98.3 F (36.8 C), temperature source Oral, resp. rate Marland Kitchen)  23, SpO2 95 %.   Gen.: Middle aged obese female appears fatigued, not in distress HEENT: Pupils  reactive bilaterally, EOMI, Pallor present, no icterus, dry oral mucosa, supple neck, no cervical lymphadenopathy, Chest: Clear to auscultation bilaterally, no added sounds CVS: Normal S1 and S2, no murmurs rub or gallop GI: Soft, nondistended, bowel sounds present, epigastric tenderness to pressure Musculoskeletal: Warm, no edema, normal skin CNS: Alert and oriented  Data Review:    CBC Recent Labs  Lab 03/24/17 1027  WBC 9.6  HGB 10.0*  HCT 32.9*  PLT 379  MCV 73.6*  MCH 22.4*  MCHC 30.4  RDW 16.0*  LYMPHSABS 1.9  MONOABS 0.7  EOSABS 0.1  BASOSABS 0.0   ------------------------------------------------------------------------------------------------------------------  Chemistries  Recent Labs  Lab 03/24/17 1027  NA 137  K 4.4  CL 104  CO2 21*  GLUCOSE 374*  BUN 33*  CREATININE 2.38*  CALCIUM 9.0  AST 14*  ALT 21  ALKPHOS 106  BILITOT 0.5   ------------------------------------------------------------------------------------------------------------------ estimated creatinine clearance is 28 mL/min (A) (by C-G formula based on SCr of 2.38 mg/dL (H)). ------------------------------------------------------------------------------------------------------------------ No results for input(s): TSH, T4TOTAL, T3FREE, THYROIDAB in the last 72 hours.  Invalid input(s): FREET3  Coagulation profile No results for input(s): INR, PROTIME in the last 168 hours. ------------------------------------------------------------------------------------------------------------------- No results for input(s): DDIMER in the last 72 hours. -------------------------------------------------------------------------------------------------------------------  Cardiac Enzymes Recent Labs  Lab 03/24/17 1027  TROPONINI <0.03   ------------------------------------------------------------------------------------------------------------------    Component Value Date/Time   BNP 51.0  02/14/2017 1122     ---------------------------------------------------------------------------------------------------------------  Urinalysis    Component Value Date/Time   COLORURINE YELLOW 03/24/2017 0951   APPEARANCEUR HAZY (A) 03/24/2017 0951   LABSPEC 1.022 03/24/2017 0951   PHURINE 5.0 03/24/2017 0951   GLUCOSEU >=500 (A) 03/24/2017 0951   HGBUR NEGATIVE 03/24/2017 0951   BILIRUBINUR NEGATIVE 03/24/2017 0951   BILIRUBINUR neg 02/15/2017 0913   KETONESUR NEGATIVE 03/24/2017 0951   PROTEINUR >=300 (A) 03/24/2017 0951   UROBILINOGEN 0.2 02/15/2017 0913   NITRITE NEGATIVE 03/24/2017 0951   LEUKOCYTESUR NEGATIVE 03/24/2017 0951    ----------------------------------------------------------------------------------------------------------------   Imaging Results:    Ct Abdomen Pelvis Wo Contrast  Result Date: 03/24/2017 CLINICAL DATA:  Back pain and vomiting. EXAM: CT ABDOMEN AND PELVIS WITHOUT CONTRAST TECHNIQUE: Multidetector CT imaging of the abdomen and pelvis was performed following the standard protocol without IV contrast. COMPARISON:  CT 10/10/2015 FINDINGS: Lower chest: Lung bases are clear. Hepatobiliary:  No focal hepatic lesion.  Gallbladder normal Pancreas: fullness in the pancreatic head measuring 4.0 x 4.7 cm (image 34, series 2). There is mild inflammatory stranding in the adjacent retroperitoneal fat. No organized fluid collections. The body and tail the pancreas normal. No duct dilatation. Spleen: Normal spleen Adrenals/urinary tract: Low-density enlargement of the LEFT adrenal gland consistent small benign adenoma. Similar finding in the RIGHT adrenal gland which is slightly higher density cannot be fully characterized as benign adenoma but favor such. Kidneys ureters and bladder normal. Simple fluid attenuation cyst in the lower pole of the RIGHT kidney Stomach/Bowel: Stomach, duodenum, small-bowel and cecum normal. Appendix is normal. The colon and rectosigmoid colon  are normal. Vascular/Lymphatic: Abdominal aorta is normal caliber. There is no retroperitoneal or periportal lymphadenopathy. No pelvic lymphadenopathy. Reproductive: Post hysterectomy Other: No free fluid. Musculoskeletal: No aggressive osseous lesion IMPRESSION: 1. New enlargement of the pancreatic head with peripancreatic inflammation is most consistent with focal pancreatitis. Recommend serum biochemical correlation. Additionally, recommend follow-up CT abdomen with contrast following resolution of acute symptomology (  with 1 to 2 months) to exclude unlikely underlying lesion. 2. Bilateral adrenal adenomas. Electronically Signed   By: Suzy Bouchard M.D.   On: 03/24/2017 13:33   Dg Chest 2 View  Result Date: 03/24/2017 CLINICAL DATA:  Cough.  Hypertension. EXAM: CHEST  2 VIEW COMPARISON:  02/14/2017 FINDINGS: The heart size and mediastinal contours are within normal limits. Scarring identified within the left base. The visualized skeletal structures are unremarkable. IMPRESSION: No active cardiopulmonary disease. Electronically Signed   By: Kerby Moors M.D.   On: 03/24/2017 11:17    My personal review of EKG: Sinus rhythm at 76, no ST-T changes.   Assessment & Plan:    Principal Problem:   Acute pancreatitis Etiology unclear. Has history of elevated triglycerides.. Was last hospitalized in July 2017 with similar symptoms. Keep nothing by mouth. Pain control with when necessary IV morphine. IV fluids. Serial abdominal exam and monitor lipase. Will discuss with her gastroenterologist tomorrow.    Active Problems:   Uncontrolled diabetes mellitus with diabetic nephropathy, with long-term current use of insulin (HCC) Hyperglycemia on presentation. Patient is nothing by mouth. I will resume her home dose Lantus (100 units). She was seen by endocrinologist Dr Cruzita Lederer recently who increased her pre-meal Humalog dose. A1c is 11.5.  Uncontrolled hypertension On multiple medications. Will resume  her clonidine, ARB, amlodipine and Lasix. I will discontinue her HCTZ-triamterene and Aldactone and given her CAD and Imdur and when necessary hydralazine. Will discuss antihypertensive regimen with her nephrologist Dr. Lowanda Foster during this hospital stay.   Hyperlipidemia Continue statin and Zetia. Check triglycerides.    Obesity Reportedly planning on gastric bypass this year.      Sleep apnea CPAP at night.    GERD (gastroesophageal reflux disease)/ ? Diabetic gastroparesis Persistent reflux symptoms. Had EGD with BRAVO capsule study for GERD and dysphagia in September 2018. Appears that Protonix does help with her symptoms of GERD. Will place on IV Protonix. She has been referred to [facility] [place] [place] and has appointment later this month. When necessary Zofran for nausea/vomiting.    IBS (irritable bowel syndrome) Continue dicyclomine.    Uncontrolled hypertension   Anemia of chronic disease/microcytic anemia  low MCV. Check iron panel.      DVT Prophylaxis : Subcutaneous heparin  AM Labs Ordered, also please review Full Orders  Family Communication: Admission, patients condition and plan of care including tests being ordered have been discussed with the patient and her husband at bedside   Code Status : Full code  Likely DC to  home  Condition: [place] called: None   Admission status: Inpatient   Time spent in minutes : 60   Anijah Spohr M.D on 03/24/2017 at 4:32 PM  Between 7am to 7pm - Pager - [phone]. After 7pm go to [email or web address] - password [facility]  [facility] - Office  [phone]

## 2017-03-25 ENCOUNTER — Other Ambulatory Visit: Payer: Self-pay

## 2017-03-25 DIAGNOSIS — D508 Other iron deficiency anemias: Secondary | ICD-10-CM

## 2017-03-25 LAB — BASIC METABOLIC PANEL
Anion gap: 11 (ref 5–15)
BUN: 34 mg/dL — ABNORMAL HIGH (ref 6–20)
CHLORIDE: 107 mmol/L (ref 101–111)
CO2: 20 mmol/L — ABNORMAL LOW (ref 22–32)
Calcium: 8.4 mg/dL — ABNORMAL LOW (ref 8.9–10.3)
Creatinine, Ser: 2.47 mg/dL — ABNORMAL HIGH (ref 0.44–1.00)
GFR calc non Af Amer: 20 mL/min — ABNORMAL LOW (ref >60)
GFR, EST AFRICAN AMERICAN: 23 mL/min — AB (ref >60)
Glucose, Bld: 201 mg/dL — ABNORMAL HIGH (ref 65–99)
POTASSIUM: 4.6 mmol/L (ref 3.5–5.1)
SODIUM: 138 mmol/L (ref 135–145)

## 2017-03-25 LAB — CBC
HEMATOCRIT: 28.9 % — AB (ref 36.0–46.0)
HEMOGLOBIN: 8.7 g/dL — AB (ref 12.0–15.0)
MCH: 22.4 pg — ABNORMAL LOW (ref 26.0–34.0)
MCHC: 30.1 g/dL (ref 30.0–36.0)
MCV: 74.5 fL — ABNORMAL LOW (ref 78.0–100.0)
Platelets: 375 10*3/uL (ref 150–400)
RBC: 3.88 MIL/uL (ref 3.87–5.11)
RDW: 16.2 % — ABNORMAL HIGH (ref 11.5–15.5)
WBC: 9.3 10*3/uL (ref 4.0–10.5)

## 2017-03-25 LAB — HIV ANTIBODY (ROUTINE TESTING W REFLEX): HIV Screen 4th Generation wRfx: NONREACTIVE

## 2017-03-25 LAB — GLUCOSE, CAPILLARY
GLUCOSE-CAPILLARY: 214 mg/dL — AB (ref 65–99)
GLUCOSE-CAPILLARY: 172 mg/dL — AB (ref 65–99)
GLUCOSE-CAPILLARY: 155 mg/dL — AB (ref 65–99)
GLUCOSE-CAPILLARY: 136 mg/dL — AB (ref 65–99)
Glucose-Capillary: 228 mg/dL — ABNORMAL HIGH (ref 65–99)
Glucose-Capillary: 241 mg/dL — ABNORMAL HIGH (ref 65–99)

## 2017-03-25 LAB — LIPASE, BLOOD: Lipase: 79 U/L — ABNORMAL HIGH (ref 11–51)

## 2017-03-25 MED ORDER — GUAIFENESIN-DM 100-10 MG/5ML PO SYRP
5.0000 mL | ORAL_SOLUTION | ORAL | Status: DC | PRN
  Administered 2017-03-25 – 2017-03-28 (×6): 5 mL via ORAL
  Filled 2017-03-25 (×7): qty 5

## 2017-03-25 MED ORDER — HYDROCODONE-ACETAMINOPHEN 5-325 MG PO TABS
2.0000 | ORAL_TABLET | ORAL | Status: DC | PRN
  Administered 2017-03-25 – 2017-03-29 (×9): 2 via ORAL
  Filled 2017-03-25 (×10): qty 2

## 2017-03-25 NOTE — Progress Notes (Signed)
Contacted Dr. Clementeen Graham that son is here and would like to talk to him.  Patient continues with nausea but has not vomited and is now on clear liquid diet

## 2017-03-25 NOTE — Progress Notes (Signed)
PROGRESS NOTE                                                                                                                                                                                                             Patient Demographics:    Kathryn Beck, is a 62 y.o. female, DOB - March 30, 1955, MPN:[id number]  Admit date - 03/24/2017   Admitting Physician Louellen Molder, MD  Outpatient Primary MD for the patient is Fayrene Helper, MD  LOS - 1  Outpatient Specialists: Dr. Oneida Alar (GI) Dr Cruzita Lederer (Endocrinology)  Chief Complaint  Patient presents with  . 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    Subjective:   Patient still having epigastric pain which is better since admission. Started on full liquid diet which caused her to have nausea.   Assessment  & Plan :    Principal Problem:   Acute pancreatitis No clear etiology except for hepatitis arrives (in 300s this admission, was >1300 , 1 month back. Continue IV hydration, pain control with when necessary morphine, added Vicodin for ongoing pain. Clear liquid diet. Zofran when necessary for nausea. Serial abdominal exam. Lipase improved in a.m. Labs.    Active Problems:   Uncontrolled diabetes mellitus with diabetic nephropathy, with long-term current use of insulin (HCC) Continue home dose Lantus with sliding scale coverage. I would add pre-meal aspart 7 units 3 times a day and titrate to her home dose based on me intake. Last A1c of  11.5.  Uncontrolled GERD +/-diabetic gastroparesis Has persistent reflux symptoms. Underwent EGD with BRAVO capsule study in September 2018 for dysphagia. Continue PPI. Has been referred to [facility] for second opinion and has appointment later this month.  Essential hypertension On multiple blood pressure medications for uncontrolled hypertension. Resume her clonidine, ARB, Lasix and amlodipine. Discontinued HCTZ-triamterene and Aldactone given her chronic kidney disease and added Imdur along with when necessary hydralazine.  Hypertriglyceridemia Continue Zetia. add gemfibrozil upon discharge.  Sleep apnea Nighttime CPAP  Iron deficiency anemia Low normal iron level. add supplement upon discharge. Noted for drop in H&H. Check stool for Hemoccult.   Code Status : Full code  Family Communication  : Son and daughter at bedside  Disposition Plan  : Home pending clinical improvement, possibly in the next 48 hours  Barriers For Discharge : Active symptoms  Consults  :  None  Procedures  : CT abdomen  DVT Prophylaxis  : Subcutaneous heparin  Lab Results  Component Value Date   PLT 375 03/25/2017    Antibiotics  :    Anti-infectives (From admission, onward)   None        Objective:   Vitals:   03/24/17 1852 03/24/17 1950 03/24/17 2000 03/25/17 0409  BP: (!) 186/80  (!) 179/80 109/63  Pulse: 69  82 73  Resp: 18  18 18   Temp: 98.4 F (36.9 C)  98.2 F (36.8 C) 98.9 F (37.2 C)  TempSrc: Oral  Oral Oral  SpO2: 96%  97% 98%  Weight:  95 kg (209 lb 7 oz)    Height:  5\' 4"  (1.626 m)      Wt Readings from Last 3 Encounters:  03/24/17 95 kg (209 lb 7 oz)  03/19/17 94.3 kg (208 lb)  02/15/17 93.4 kg (206 lb)     Intake/Output Summary (Last 24 hours) at 03/25/2017 1417 Last data filed at 03/25/2017 0700 Gross per 24 hour  Intake 1251.67 ml  Output -  Net 1251.67 ml     Physical Exam  Gen: In some distress with pain HEENT: Pallor present, dry mucosa,  supple neck Chest: clear b/l, no added sounds CVS: N S1&S2, no murmurs,  GI: soft, nondistended, epigastric tenderness to pressure, bowel sounds present Musculoskeletal: warm, no edema     Data Review:    CBC Recent Labs  Lab 03/24/17 1027 03/25/17 0408  WBC 9.6 9.3  HGB 10.0* 8.7*  HCT 32.9* 28.9*  PLT 379 375  MCV 73.6* 74.5*  MCH 22.4* 22.4*  MCHC 30.4 30.1  RDW 16.0* 16.2*  LYMPHSABS 1.9  --   MONOABS 0.7  --   EOSABS 0.1  --   BASOSABS 0.0  --     Chemistries  Recent Labs  Lab 03/24/17 1027 03/25/17 0408  NA 137 138  K 4.4 4.6  CL 104 107  CO2 21* 20*  GLUCOSE 374* 201*  BUN 33* 34*  CREATININE 2.38* 2.47*  CALCIUM 9.0 8.4*  AST 14*  --   ALT 21  --   ALKPHOS 106  --   BILITOT 0.5  --    ------------------------------------------------------------------------------------------------------------------ Recent Labs    03/24/17 1632  TRIG 414*    Lab Results  Component Value Date   HGBA1C 11.5 03/19/2017   ------------------------------------------------------------------------------------------------------------------ No results for input(s): TSH, T4TOTAL, T3FREE, THYROIDAB in the last 72 hours.  Invalid input(s): FREET3 ------------------------------------------------------------------------------------------------------------------ Recent Labs    03/24/17 1632  TIBC 232*  IRON 28    Coagulation profile No results for input(s): INR, PROTIME in the last 168 hours.  No results for input(s): DDIMER in the last 72 hours.  Cardiac Enzymes Recent Labs  Lab 03/24/17 1027  TROPONINI <0.03   ------------------------------------------------------------------------------------------------------------------    Component Value Date/Time   BNP 51.0 02/14/2017 1122    Inpatient Medications  Scheduled Meds: . allopurinol  300 mg Oral Daily  . atorvastatin  20 mg Oral q1800  . [START ON 03/26/2017] calcitRIOL  0.25 mcg Oral Once  per day on  Mon Wed Fri  . ciprofloxacin-dexamethasone  4 drop Both EARS BID  . cloNIDine  0.1 mg Oral Q24H  . cyclobenzaprine  10 mg Oral QHS  . dicyclomine  10 mg Oral TID AC & HS  . ezetimibe  10 mg Oral Daily  . furosemide  40 mg Oral Daily  . heparin  5,000 Units Subcutaneous Q8H  . insulin aspart  0-20 Units Subcutaneous Q4H  . insulin detemir  100 Units Subcutaneous Q2200  . irbesartan  300 mg Oral Daily  . isosorbide mononitrate  30 mg Oral Daily  . labetalol  400 mg Oral BID  . montelukast  10 mg Oral QHS   Continuous Infusions: . sodium chloride 100 mL/hr at 03/25/17 1005   PRN Meds:.acetaminophen **OR** acetaminophen, azelastine, hydrALAZINE, HYDROcodone-acetaminophen, morphine injection, ondansetron **OR** ondansetron (ZOFRAN) IV  Micro Results No results found for this or any previous visit (from the past 240 hour(s)).  Radiology Reports Ct Abdomen Pelvis Wo Contrast  Result Date: 03/24/2017 CLINICAL DATA:  Back pain and vomiting. EXAM: CT ABDOMEN AND PELVIS WITHOUT CONTRAST TECHNIQUE: Multidetector CT imaging of the abdomen and pelvis was performed following the standard protocol without IV contrast. COMPARISON:  CT 10/10/2015 FINDINGS: Lower chest: Lung bases are clear. Hepatobiliary:  No focal hepatic lesion.  Gallbladder normal Pancreas: fullness in the pancreatic head measuring 4.0 x 4.7 cm (image 34, series 2). There is mild inflammatory stranding in the adjacent retroperitoneal fat. No organized fluid collections. The body and tail the pancreas normal. No duct dilatation. Spleen: Normal spleen Adrenals/urinary tract: Low-density enlargement of the LEFT adrenal gland consistent small benign adenoma. Similar finding in the RIGHT adrenal gland which is slightly higher density cannot be fully characterized as benign adenoma but favor such. Kidneys ureters and bladder normal. Simple fluid attenuation cyst in the lower pole of the RIGHT kidney Stomach/Bowel: Stomach, duodenum, small-bowel  and cecum normal. Appendix is normal. The colon and rectosigmoid colon are normal. Vascular/Lymphatic: Abdominal aorta is normal caliber. There is no retroperitoneal or periportal lymphadenopathy. No pelvic lymphadenopathy. Reproductive: Post hysterectomy Other: No free fluid. Musculoskeletal: No aggressive osseous lesion IMPRESSION: 1. New enlargement of the pancreatic head with peripancreatic inflammation is most consistent with focal pancreatitis. Recommend serum biochemical correlation. Additionally, recommend follow-up CT abdomen with contrast following resolution of acute symptomology (with 1 to 2 months) to exclude unlikely underlying lesion. 2. Bilateral adrenal adenomas. Electronically Signed   By: Suzy Bouchard M.D.   On: 03/24/2017 13:33   Dg Chest 2 View  Result Date: 03/24/2017 CLINICAL DATA:  Cough.  Hypertension. EXAM: CHEST  2 VIEW COMPARISON:  02/14/2017 FINDINGS: The heart size and mediastinal contours are within normal limits. Scarring identified within the left base. The visualized skeletal structures are unremarkable. IMPRESSION: No active cardiopulmonary disease. Electronically Signed   By: Kerby Moors M.D.   On: 03/24/2017 11:17    Time Spent in minutes  25   Tashawnda Bleiler M.D on 03/25/2017 at 2:17 PM  Between 7am to 7pm - Pager - [phone]  After 7pm go to [email or web address] - password [email or web address]  [facility] -  Office  [phone]

## 2017-03-25 NOTE — Progress Notes (Signed)
Dr. Francesco Sor called and ordered to give the bp medications.  Patient started on full liqiuds and ate some breakfast and then began to get nauseated.  Received zofran. Contacted Dr. Clementeen Graham

## 2017-03-25 NOTE — Progress Notes (Addendum)
Inpatient Diabetes Program Recommendations  AACE/ADA: New Consensus Statement on Inpatient Glycemic Control (2015)  Target Ranges:  Prepandial:   less than 140 mg/dL      Peak postprandial:   less than 180 mg/dL (1-2 hours)      Critically ill patients:  140 - 180 mg/dL  Results for Kathryn Beck, Kathryn Beck (MRN [id number]) as of 03/25/2017 08:06  Ref. Range 03/24/2017 20:03 03/25/2017 00:04 03/25/2017 04:07 03/25/2017 07:48  Glucose-Capillary Latest Ref Range: 65 - 99 mg/dL 299 (H)  Novolog 11 units  Levemir 50 units @ 23:34 241 (H)  Novolog 7 units 214 (H)  Novolog 7 units 172 (H)   Results for Kathryn Beck, Kathryn Beck (MRN [id number]) as of 03/25/2017 08:06  Ref. Range 11/25/2016 08:02 03/19/2017 13:56  Hemoglobin A1C Unknown 12.5 (H) 11.5    Review of Glycemic Control  Diabetes history: DM2 Outpatient Diabetes medications: Levemir 100 units QHS, Humalog 22-26 units TID with meals Current orders for Inpatient glycemic control: Levemir 100 units QHS, Novolog 0-20 units Q4H  Inpatient Diabetes Program Recommendations:  Insulin - Basal: Patient would only take Levemir 50 units last night at bedtime and patient has received a total of Novolog 14 units for correction since receiving Levemir. Please consider decreasing Levemir to 60 units QHS. HgbA1C: A1C 11.5% on 03/19/17 indicating an average glucose of 283 mg/d. Prior A1C was 12.5% on 11/25/16. Patient is followed by Dr. Cruzita Lederer (Endocrinologist) and needs to follow up regarding DM control.  Addendum 03/25/17@10 :03-Spoke with patient about diabetes and home regimen for diabetes control. Patient reports that she is followed by Dr. Cruzita Lederer for diabetes management and currently she takes Levemir 100 units QHS and Novolog 22-26 units TID with meals (and 11-12 units if not eating a meal) as an outpatient for diabetes control. Patient reports that she is taking insulin as prescribed. Patient states that she checks her glucose 3 times per day and that it is usually in the  200's mg/dl even in the morning (fasting). Discussed las A1C results in the chart (11.5% on 03/19/17) and explained that her most current A1C indicates an average glucose of 283 mg/dl over the past 2-3 months. Discussed glucose and A1C goals. Discussed importance of checking CBGs and maintaining good CBG control to prevent long-term and short-term complications. Explained how hyperglycemia leads to damage within blood vessels which lead to the common complications seen with uncontrolled diabetes. Stressed to the patient the importance of improving glycemic control to prevent further complications from uncontrolled diabetes. Discussed impact of nutrition, exercise, stress, sickness, and medications on diabetes control. Patient states that she tries to follow a Carb Modified diet but admits that she drinks a regular soda about once a day. Discussed carbohydrates, carbohydrate goals per day and meal, along with portion sizes. Encouraged patient to eliminate sugary beverages. Encouraged patient to check her glucose 4 times per day (before meals and at bedtime) and to keep a log book of glucose readings and insulin taken which she will need to take to doctor appointments. Explained how Dr. Cruzita Lederer can use the log book to continue to make insulin adjustments if needed. Explained importance of writing down exactly how much insulin she is taking so Dr. Cruzita Lederer will know what she is taking and discussed how it could help Dr. Cruzita Lederer make insulin adjustments to help improve DM control. Patient verbalized understanding of information discussed and she states that she has no further questions at this time related to diabetes. Patient reports that she has been having abdominal pain  for about 2 weeks and she reports that pain has been worse today since she had full liquid for breakfast. Patient reported that RN gave her pain mediation about 7am today. Encouraged patient to call RN to let them know she is continuing to have  pain.  Thanks, Barnie Alderman, RN, MSN, CDE Diabetes Coordinator Inpatient Diabetes Program [phone] (Team Pager from 8am to 5pm)

## 2017-03-25 NOTE — Progress Notes (Signed)
Gave norco for pain.  Continues to feel somewhat nauseated.  Sister giving bath at this time . Sitting on side of bed.

## 2017-03-25 NOTE — Progress Notes (Signed)
BP 109/63 pulse 73.  Texted Dr. Clementeen Graham concerning giving BP medication

## 2017-03-26 LAB — CBC
HCT: 27.8 % — ABNORMAL LOW (ref 36.0–46.0)
Hemoglobin: 8.1 g/dL — ABNORMAL LOW (ref 12.0–15.0)
MCH: 21.8 pg — AB (ref 26.0–34.0)
MCHC: 29.1 g/dL — AB (ref 30.0–36.0)
MCV: 74.9 fL — AB (ref 78.0–100.0)
PLATELETS: 383 10*3/uL (ref 150–400)
RBC: 3.71 MIL/uL — ABNORMAL LOW (ref 3.87–5.11)
RDW: 16.6 % — ABNORMAL HIGH (ref 11.5–15.5)
WBC: 8.2 10*3/uL (ref 4.0–10.5)

## 2017-03-26 LAB — GLUCOSE, CAPILLARY
GLUCOSE-CAPILLARY: 140 mg/dL — AB (ref 65–99)
GLUCOSE-CAPILLARY: 172 mg/dL — AB (ref 65–99)
GLUCOSE-CAPILLARY: 88 mg/dL (ref 65–99)
Glucose-Capillary: 100 mg/dL — ABNORMAL HIGH (ref 65–99)
Glucose-Capillary: 131 mg/dL — ABNORMAL HIGH (ref 65–99)
Glucose-Capillary: 177 mg/dL — ABNORMAL HIGH (ref 65–99)

## 2017-03-26 LAB — BASIC METABOLIC PANEL
Anion gap: 11 (ref 5–15)
BUN: 35 mg/dL — AB (ref 6–20)
CO2: 20 mmol/L — ABNORMAL LOW (ref 22–32)
CREATININE: 2.57 mg/dL — AB (ref 0.44–1.00)
Calcium: 8 mg/dL — ABNORMAL LOW (ref 8.9–10.3)
Chloride: 110 mmol/L (ref 101–111)
GFR calc Af Amer: 22 mL/min — ABNORMAL LOW (ref >60)
GFR calc non Af Amer: 19 mL/min — ABNORMAL LOW (ref >60)
GLUCOSE: 142 mg/dL — AB (ref 65–99)
Potassium: 4.4 mmol/L (ref 3.5–5.1)
SODIUM: 141 mmol/L (ref 135–145)

## 2017-03-26 NOTE — Progress Notes (Addendum)
PROGRESS NOTE                                                                                                                                                                                                             Patient Demographics:    Kathryn Beck, is a 62 y.o. female, DOB - 1955-08-16, TKZ:[id number]  Admit date - 03/24/2017   Admitting Physician Louellen Molder, MD  Outpatient Primary MD for the patient is Fayrene Helper, MD  LOS - 2  Outpatient Specialists: Dr. Oneida Alar (GI) Dr Cruzita Lederer (Endocrinology)  Chief Complaint  Patient presents with  . 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    Subjective:   Patient tolerating diet advance to full liquid but still having off-and-on epigastric pain.  Assessment  & Plan :    Principal Problem:   Acute pancreatitis No clear etiology except for chronically elevated triglycerides (in 300s this admission, was >1300 , 1 month back. Continue IV hydration, pain control with when necessary morphine, added Vicodin for ongoing pain. Diet advance to full liquid. Zofran when necessary for nausea. Serial abdominal exam.     Active Problems:   Uncontrolled diabetes mellitus with diabetic nephropathy, with long-term current use of insulin (HCC) Continue home dose Lantus with sliding scale coverage. CBG stable. Last A1c of 11.5.  Uncontrolled GERD +/-diabetic gastroparesis Has persistent reflux symptoms. Underwent EGD with BRAVO capsule study in September 2018  for dysphagia. Continue PPI. Has been referred to [facility] for second opinion and has appointment later this month.  Essential hypertension On multiple blood pressure medications for uncontrolled hypertension. Continue clonidine ARB, Lasix and amlodipine. Discontinued HCTZ-triamterene and Aldactone given her chronic kidney disease and added Imdur along with when necessary hydralazine. Blood pressure currently stable.  Hypertriglyceridemia Continue Zetia. add gemfibrozil upon discharge.    Sleep apnea Nighttime CPAP  Iron deficiency anemia Low normal iron level. add supplement upon discharge. Noted for drop in H&H. Stool for Hemoccult still pending.  Code Status : Full code  Family Communication  : None at bedside today  Disposition Plan  : Home possibly  tomorrow if tolerating advanced diet and pain improved.  Barriers For Discharge : Active symptoms  Consults  :  None  Procedures  : CT abdomen  DVT Prophylaxis  : Subcutaneous heparin  Lab Results  Component Value Date   PLT 383 03/26/2017    Antibiotics  :    Anti-infectives (From admission, onward)   None        Objective:   Vitals:   03/25/17 0409 03/25/17 1448 03/25/17 2135 03/26/17 0531  BP: 109/63 117/64 137/68 138/68  Pulse: 73 74 68 68  Resp: 18 18 18 20   Temp: 98.9 F (37.2 C) 98.3 F (36.8 C) 98.2 F (36.8 C) 97.8 F (36.6 C)  TempSrc: Oral Oral Oral Oral  SpO2: 98% 95% 97% 98%  Weight:      Height:        Wt Readings from Last 3 Encounters:  03/24/17 95 kg (209 lb 7 oz)  03/19/17 94.3 kg (208 lb)  02/15/17 93.4 kg (206 lb)     Intake/Output Summary (Last 24 hours) at 03/26/2017 1536 Last data filed at 03/26/2017 1000 Gross per 24 hour  Intake 2380 ml  Output -  Net 2380 ml     Physical Exam General: Obese female not in distress HEENT: Pallor present, moist mucosa, supple neck Chest: Clear bilaterally CVS: Normal S1 and S2, no murmurs GI: Soft, epigastric tenderness,  nondistended, bowel sounds present Musculoskeletal: Warm, no edema     Data Review:    CBC Recent Labs  Lab 03/24/17 1027 03/25/17 0408 03/26/17 0445  WBC 9.6 9.3 8.2  HGB 10.0* 8.7* 8.1*  HCT 32.9* 28.9* 27.8*  PLT 379 375 383  MCV 73.6* 74.5* 74.9*  MCH 22.4* 22.4* 21.8*  MCHC 30.4 30.1 29.1*  RDW 16.0* 16.2* 16.6*  LYMPHSABS 1.9  --   --   MONOABS 0.7  --   --   EOSABS 0.1  --   --   BASOSABS 0.0  --   --     Chemistries  Recent Labs  Lab 03/24/17 1027 03/25/17 0408 03/26/17 0445  NA 137 138 141  K 4.4 4.6 4.4  CL 104 107 110  CO2 21* 20* 20*  GLUCOSE 374* 201* 142*  BUN 33* 34* 35*  CREATININE 2.38* 2.47* 2.57*  CALCIUM 9.0 8.4* 8.0*  AST 14*  --   --   ALT 21  --   --   ALKPHOS 106  --   --   BILITOT 0.5  --   --    ------------------------------------------------------------------------------------------------------------------ Recent Labs    03/24/17 1632  TRIG 414*    Lab Results  Component Value Date   HGBA1C 11.5 03/19/2017   ------------------------------------------------------------------------------------------------------------------ No results for input(s): TSH, T4TOTAL, T3FREE, THYROIDAB in the last 72 hours.  Invalid input(s): FREET3 ------------------------------------------------------------------------------------------------------------------ Recent Labs    03/24/17 1632  TIBC 232*  IRON 28    Coagulation profile No results for input(s): INR, PROTIME in the last 168 hours.  No results for input(s): DDIMER in the last 72 hours.  Cardiac Enzymes Recent Labs  Lab 03/24/17 1027  TROPONINI <0.03   ------------------------------------------------------------------------------------------------------------------    Component Value Date/Time   BNP 51.0 02/14/2017 1122    Inpatient Medications  Scheduled Meds: . allopurinol  300 mg Oral Daily  . atorvastatin  20 mg Oral q1800  . calcitRIOL  0.25 mcg  Oral Once per  day on Mon Wed Fri  . ciprofloxacin-dexamethasone  4 drop Both EARS BID  . cloNIDine  0.1 mg Oral Q24H  . cyclobenzaprine  10 mg Oral QHS  . dicyclomine  10 mg Oral TID AC & HS  . ezetimibe  10 mg Oral Daily  . furosemide  40 mg Oral Daily  . heparin  5,000 Units Subcutaneous Q8H  . insulin aspart  0-20 Units Subcutaneous Q4H  . insulin detemir  100 Units Subcutaneous Q2200  . irbesartan  300 mg Oral Daily  . isosorbide mononitrate  30 mg Oral Daily  . labetalol  400 mg Oral BID  . montelukast  10 mg Oral QHS   Continuous Infusions: . sodium chloride 100 mL/hr at 03/26/17 1451   PRN Meds:.acetaminophen **OR** acetaminophen, azelastine, guaiFENesin-dextromethorphan, hydrALAZINE, HYDROcodone-acetaminophen, morphine injection, ondansetron **OR** ondansetron (ZOFRAN) IV  Micro Results No results found for this or any previous visit (from the past 240 hour(s)).  Radiology Reports Ct Abdomen Pelvis Wo Contrast  Result Date: 03/24/2017 CLINICAL DATA:  Back pain and vomiting. EXAM: CT ABDOMEN AND PELVIS WITHOUT CONTRAST TECHNIQUE: Multidetector CT imaging of the abdomen and pelvis was performed following the standard protocol without IV contrast. COMPARISON:  CT 10/10/2015 FINDINGS: Lower chest: Lung bases are clear. Hepatobiliary:  No focal hepatic lesion.  Gallbladder normal Pancreas: fullness in the pancreatic head measuring 4.0 x 4.7 cm (image 34, series 2). There is mild inflammatory stranding in the adjacent retroperitoneal fat. No organized fluid collections. The body and tail the pancreas normal. No duct dilatation. Spleen: Normal spleen Adrenals/urinary tract: Low-density enlargement of the LEFT adrenal gland consistent small benign adenoma. Similar finding in the RIGHT adrenal gland which is slightly higher density cannot be fully characterized as benign adenoma but favor such. Kidneys ureters and bladder normal. Simple fluid attenuation cyst in the lower pole of the RIGHT kidney  Stomach/Bowel: Stomach, duodenum, small-bowel and cecum normal. Appendix is normal. The colon and rectosigmoid colon are normal. Vascular/Lymphatic: Abdominal aorta is normal caliber. There is no retroperitoneal or periportal lymphadenopathy. No pelvic lymphadenopathy. Reproductive: Post hysterectomy Other: No free fluid. Musculoskeletal: No aggressive osseous lesion IMPRESSION: 1. New enlargement of the pancreatic head with peripancreatic inflammation is most consistent with focal pancreatitis. Recommend serum biochemical correlation. Additionally, recommend follow-up CT abdomen with contrast following resolution of acute symptomology (with 1 to 2 months) to exclude unlikely underlying lesion. 2. Bilateral adrenal adenomas. Electronically Signed   By: Suzy Bouchard M.D.   On: 03/24/2017 13:33   Dg Chest 2 View  Result Date: 03/24/2017 CLINICAL DATA:  Cough.  Hypertension. EXAM: CHEST  2 VIEW COMPARISON:  02/14/2017 FINDINGS: The heart size and mediastinal contours are within normal limits. Scarring identified within the left base. The visualized skeletal structures are unremarkable. IMPRESSION: No active cardiopulmonary disease. Electronically Signed   By: Kerby Moors M.D.   On: 03/24/2017 11:17    Time Spent in minutes  25   Valaria Kohut M.D on 03/26/2017 at 3:36 PM  Between 7am to 7pm - Pager - [phone]  After 7pm go to [email or web address] - password [facility]  [facility] -  Office  [phone]

## 2017-03-26 NOTE — Progress Notes (Signed)
RN has educated pt multiple times on needing a stool specimen to see if there is blood in her stool. Pt has went to bathroom x2 in the toilet when the [facility] is beside the bed and both times she flushed her stool. Will continue to monitor pt

## 2017-03-27 LAB — GLUCOSE, CAPILLARY
GLUCOSE-CAPILLARY: 117 mg/dL — AB (ref 65–99)
GLUCOSE-CAPILLARY: 182 mg/dL — AB (ref 65–99)
Glucose-Capillary: 183 mg/dL — ABNORMAL HIGH (ref 65–99)
Glucose-Capillary: 161 mg/dL — ABNORMAL HIGH (ref 65–99)
Glucose-Capillary: 137 mg/dL — ABNORMAL HIGH (ref 65–99)
Glucose-Capillary: 110 mg/dL — ABNORMAL HIGH (ref 65–99)
Glucose-Capillary: 115 mg/dL — ABNORMAL HIGH (ref 65–99)

## 2017-03-27 LAB — CBC
HCT: 27 % — ABNORMAL LOW (ref 36.0–46.0)
Hemoglobin: 8 g/dL — ABNORMAL LOW (ref 12.0–15.0)
MCH: 22.3 pg — AB (ref 26.0–34.0)
MCHC: 29.6 g/dL — ABNORMAL LOW (ref 30.0–36.0)
MCV: 75.2 fL — AB (ref 78.0–100.0)
Platelets: 398 10*3/uL (ref 150–400)
RBC: 3.59 MIL/uL — AB (ref 3.87–5.11)
RDW: 16.9 % — AB (ref 11.5–15.5)
WBC: 8.2 10*3/uL (ref 4.0–10.5)

## 2017-03-27 NOTE — Progress Notes (Signed)
PROGRESS NOTE                                                                                                                                                                                                             Patient Demographics:    Kathryn Beck, is a 62 y.o. female, DOB - [birth year], TSV:[id number]  Admit date - 03/24/2017   Admitting Physician Louellen Molder, MD  Outpatient Primary MD for the patient is Fayrene Helper, MD  LOS - 3  Outpatient Specialists: Dr. Oneida Alar (GI) Dr Cruzita Lederer (Endocrinology)  Chief Complaint  Patient presents with  . 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    Subjective:   Reports having a lot of pain worsened with food intake. Denies nausea or vomiting.  Assessment  & Plan :    Principal Problem:   Acute pancreatitis No clear etiology except for chronically elevated triglycerides (in 300s this admission, was >1300 , 1 month back. Pain worsened with diet advanced. Will make her nothing by mouth and continue IV hydration and pain control with when necessary Percocet and morphine. Serial abdominal exam. Discussed with her gastroenterologist (Dr. fields) recommend that she will need EUS as outpatient and will arrange it.   Active Problems:   Uncontrolled diabetes mellitus with diabetic nephropathy, with long-term current use of insulin (HCC) CBG stable, I will reduce her Lantus dose by 40% patient will be nothing by mouth. Also on pre-meal aspart at home which is  held and monitor on sliding scale coverage. Last A1c of 11.5.  Uncontrolled GERD +/-diabetic gastroparesis Has persistent reflux symptoms. Underwent EGD with BRAVO capsule study in September 2018 for dysphagia. Continue PPI. Has been referred to [place] for second opinion and has appointment later this month.  Essential hypertension On multiple blood pressure medications for uncontrolled hypertension. Continue clonidine, ARB, Lasix and amlodipine. Discontinued HCTZ-triamterene and Aldactone given her chronic kidney disease and added Imdur along with when necessary hydralazine. Blood pressure currently  stable.  Hypertriglyceridemia Continue Zetia. add gemfibrozil upon discharge.    Sleep apnea Nighttime CPAP  Iron deficiency anemia Low normal iron level. add supplement upon discharge. Noted for drop in H&H. Stool for Hemoccult still pending.    Code Status : Full code  Family Communication  : None at bedside today  Disposition Plan  : Home possibly  tomorrow if tolerating advanced diet and pain improved.  Barriers For Discharge : Active symptoms  Consults  :  None  Procedures  : CT abdomen  DVT Prophylaxis  : Subcutaneous heparin  Lab Results  Component Value Date   PLT 398 03/27/2017    Antibiotics  :    Anti-infectives (From admission, onward)   None        Objective:   Vitals:   03/26/17 0531 03/26/17 1500 03/26/17 2147 03/27/17 0600  BP: 138/68 133/74 135/68 133/65  Pulse: 68 70 92 77  Resp: 20 19 18 18   Temp: 97.8 F (36.6 C) 98.3 F (36.8 C) 98.3 F (36.8 C) 98 F (36.7 C)  TempSrc: Oral Oral Oral Oral  SpO2: 98% 97% 96% 99%  Weight:      Height:        Wt Readings from Last 3 Encounters:  03/24/17 95 kg (209 lb 7 oz)  03/19/17 94.3 kg (208 lb)  02/15/17 93.4 kg (206 lb)     Intake/Output Summary (Last 24 hours) at 03/27/2017 1457 Last data filed at 03/27/2017 1000 Gross per 24 hour  Intake 3120 ml  Output 2 ml  Net 3118 ml      Physical Exam General: Obese female in some distress with pain HEENT: Pallor present, moist mucosa, supple neck Chest: Clear bilaterally CVS: Normal S1 and S2, no murmurs GI: Soft, nondistended, bowel sounds present, epigastric tenderness to pressure Musculoskeletal: Warm, no edema     Data Review:    CBC Recent Labs  Lab 03/24/17 1027 03/25/17 0408 03/26/17 0445 03/27/17 0815  WBC 9.6 9.3 8.2 8.2  HGB 10.0* 8.7* 8.1* 8.0*  HCT 32.9* 28.9* 27.8* 27.0*  PLT 379 375 383 398  MCV 73.6* 74.5* 74.9* 75.2*  MCH 22.4* 22.4* 21.8* 22.3*  MCHC 30.4 30.1 29.1* 29.6*  RDW 16.0* 16.2* 16.6* 16.9*  LYMPHSABS 1.9  --   --   --   MONOABS 0.7  --   --   --   EOSABS 0.1  --   --   --   BASOSABS 0.0  --   --   --     Chemistries  Recent Labs  Lab 03/24/17 1027 03/25/17 0408 03/26/17 0445  NA 137 138 141  K 4.4 4.6 4.4  CL 104 107 110  CO2 21* 20* 20*  GLUCOSE 374* 201* 142*  BUN 33* 34* 35*  CREATININE 2.38* 2.47* 2.57*  CALCIUM 9.0 8.4* 8.0*  AST 14*  --   --   ALT 21  --   --   ALKPHOS 106  --   --   BILITOT 0.5  --   --    ------------------------------------------------------------------------------------------------------------------ Recent Labs    03/24/17 1632  TRIG 414*    Lab Results  Component Value Date   HGBA1C 11.5 03/19/2017   ------------------------------------------------------------------------------------------------------------------ No results for input(s): TSH, T4TOTAL, T3FREE, THYROIDAB in the last 72 hours.  Invalid input(s): FREET3 ------------------------------------------------------------------------------------------------------------------ Recent Labs    03/24/17 1632  TIBC 232*  IRON 28    Coagulation profile No results for input(s): INR, PROTIME in the last 168  hours.  No results for input(s): DDIMER in the last 72 hours.  Cardiac Enzymes Recent Labs  Lab 03/24/17 1027  TROPONINI <0.03    ------------------------------------------------------------------------------------------------------------------    Component Value Date/Time   BNP 51.0 02/14/2017 1122    Inpatient Medications  Scheduled Meds: . allopurinol  300 mg Oral Daily  . atorvastatin  20 mg Oral q1800  . calcitRIOL  0.25 mcg Oral Once per day on Mon Wed Fri  . ciprofloxacin-dexamethasone  4 drop Both EARS BID  . cloNIDine  0.1 mg Oral Q24H  . cyclobenzaprine  10 mg Oral QHS  . dicyclomine  10 mg Oral TID AC & HS  . ezetimibe  10 mg Oral Daily  . furosemide  40 mg Oral Daily  . heparin  5,000 Units Subcutaneous Q8H  . insulin aspart  0-20 Units Subcutaneous Q4H  . insulin detemir  100 Units Subcutaneous Q2200  . irbesartan  300 mg Oral Daily  . isosorbide mononitrate  30 mg Oral Daily  . labetalol  400 mg Oral BID  . montelukast  10 mg Oral QHS   Continuous Infusions: . sodium chloride 100 mL/hr at 03/27/17 1126   PRN Meds:.acetaminophen **OR** acetaminophen, azelastine, guaiFENesin-dextromethorphan, hydrALAZINE, HYDROcodone-acetaminophen, morphine injection, ondansetron **OR** ondansetron (ZOFRAN) IV  Micro Results No results found for this or any previous visit (from the past 240 hour(s)).  Radiology Reports Ct Abdomen Pelvis Wo Contrast  Result Date: 03/24/2017 CLINICAL DATA:  Back pain and vomiting. EXAM: CT ABDOMEN AND PELVIS WITHOUT CONTRAST TECHNIQUE: Multidetector CT imaging of the abdomen and pelvis was performed following the standard protocol without IV contrast. COMPARISON:  CT 10/10/2015 FINDINGS: Lower chest: Lung bases are clear. Hepatobiliary:  No focal hepatic lesion.  Gallbladder normal Pancreas: fullness in the pancreatic head measuring 4.0 x 4.7 cm (image 34, series 2). There is mild inflammatory stranding in the adjacent retroperitoneal fat. No organized fluid collections. The body and tail the pancreas normal. No duct dilatation. Spleen: Normal spleen Adrenals/urinary tract:  Low-density enlargement of the LEFT adrenal gland consistent small benign adenoma. Similar finding in the RIGHT adrenal gland which is slightly higher density cannot be fully characterized as benign adenoma but favor such. Kidneys ureters and bladder normal. Simple fluid attenuation cyst in the lower pole of the RIGHT kidney Stomach/Bowel: Stomach, duodenum, small-bowel and cecum normal. Appendix is normal. The colon and rectosigmoid colon are normal. Vascular/Lymphatic: Abdominal aorta is normal caliber. There is no retroperitoneal or periportal lymphadenopathy. No pelvic lymphadenopathy. Reproductive: Post hysterectomy Other: No free fluid. Musculoskeletal: No aggressive osseous lesion IMPRESSION: 1. New enlargement of the pancreatic head with peripancreatic inflammation is most consistent with focal pancreatitis. Recommend serum biochemical correlation. Additionally, recommend follow-up CT abdomen with contrast following resolution of acute symptomology (with 1 to 2 months) to exclude unlikely underlying lesion. 2. Bilateral adrenal adenomas. Electronically Signed   By: Suzy Bouchard M.D.   On: 03/24/2017 13:33   Dg Chest 2 View  Result Date: 03/24/2017 CLINICAL DATA:  Cough.  Hypertension. EXAM: CHEST  2 VIEW COMPARISON:  02/14/2017 FINDINGS: The heart size and mediastinal contours are within normal limits. Scarring identified within the left base. The visualized skeletal structures are unremarkable. IMPRESSION: No active cardiopulmonary disease. Electronically Signed   By: Kerby Moors M.D.   On: 03/24/2017 11:17    Time Spent in minutes  25   Kasia Trego M.D on 03/27/2017 at 2:57 PM  Between 7am to 7pm - Pager - [phone]  After 7pm go to [email or web address] -  password [facility]  [facility] -  Office  [phone]

## 2017-03-28 DIAGNOSIS — K219 Gastro-esophageal reflux disease without esophagitis: Secondary | ICD-10-CM

## 2017-03-28 LAB — CBC
HCT: 27.9 % — ABNORMAL LOW (ref 36.0–46.0)
Hemoglobin: 8.2 g/dL — ABNORMAL LOW (ref 12.0–15.0)
MCH: 22.2 pg — AB (ref 26.0–34.0)
MCHC: 29.4 g/dL — ABNORMAL LOW (ref 30.0–36.0)
MCV: 75.6 fL — AB (ref 78.0–100.0)
Platelets: 427 10*3/uL — ABNORMAL HIGH (ref 150–400)
RBC: 3.69 MIL/uL — AB (ref 3.87–5.11)
RDW: 17.3 % — ABNORMAL HIGH (ref 11.5–15.5)
WBC: 8.3 10*3/uL (ref 4.0–10.5)

## 2017-03-28 LAB — OCCULT BLOOD X 1 CARD TO LAB, STOOL: Fecal Occult Bld: NEGATIVE

## 2017-03-28 LAB — BASIC METABOLIC PANEL
Anion gap: 9 (ref 5–15)
BUN: 30 mg/dL — AB (ref 6–20)
CO2: 21 mmol/L — AB (ref 22–32)
Calcium: 8.5 mg/dL — ABNORMAL LOW (ref 8.9–10.3)
Chloride: 116 mmol/L — ABNORMAL HIGH (ref 101–111)
Creatinine, Ser: 2.47 mg/dL — ABNORMAL HIGH (ref 0.44–1.00)
GFR calc Af Amer: 23 mL/min — ABNORMAL LOW (ref >60)
GFR, EST NON AFRICAN AMERICAN: 20 mL/min — AB (ref >60)
GLUCOSE: 87 mg/dL (ref 65–99)
POTASSIUM: 4.4 mmol/L (ref 3.5–5.1)
Sodium: 146 mmol/L — ABNORMAL HIGH (ref 135–145)

## 2017-03-28 LAB — GLUCOSE, CAPILLARY
GLUCOSE-CAPILLARY: 99 mg/dL (ref 65–99)
GLUCOSE-CAPILLARY: 69 mg/dL (ref 65–99)
GLUCOSE-CAPILLARY: 139 mg/dL — AB (ref 65–99)
Glucose-Capillary: 84 mg/dL (ref 65–99)
Glucose-Capillary: 83 mg/dL (ref 65–99)

## 2017-03-28 MED ORDER — DM-GUAIFENESIN ER 30-600 MG PO TB12
1.0000 | ORAL_TABLET | Freq: Two times a day (BID) | ORAL | Status: DC
  Administered 2017-03-28 – 2017-03-31 (×6): 1 via ORAL
  Filled 2017-03-28 (×6): qty 1

## 2017-03-28 MED ORDER — INSULIN DETEMIR 100 UNIT/ML ~~LOC~~ SOLN
60.0000 [IU] | Freq: Every day | SUBCUTANEOUS | Status: DC
  Administered 2017-03-28 – 2017-03-30 (×2): 60 [IU] via SUBCUTANEOUS
  Filled 2017-03-28 (×4): qty 0.6

## 2017-03-28 NOTE — Progress Notes (Signed)
PROGRESS NOTE                                                                                                                                                                                                             Patient Demographics:    Kathryn Beck, is a 62 y.o. female, DOB - 09-01-1955, [id number]  Admit date - 03/24/2017   Admitting Physician Louellen Molder, MD  Outpatient Primary MD for the patient is Fayrene Helper, MD  LOS - 4  Outpatient Specialists: Dr. Oneida Alar (GI) Dr Cruzita Lederer (Endocrinology)  Chief Complaint  Patient presents with  . 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    Subjective:   Abdominal pain psych he better after she was made nothing by mouth yesterday. Nausea or vomiting.  Assessment  & Plan :    Principal Problem:   Acute pancreatitis No clear etiology except for chronically elevated triglycerides (in 300s this admission, was >1300 , 1 month back. Pain worsened with diet advanced. Made nothing by mouth. continue IV hydration and pain control with when necessary Percocet and morphine. Serial abdominal exam. Discussed with her gastroenterologist (Dr. fields) recommend that she will need EUS as outpatient and will arrange it.   Active Problems:   Uncontrolled diabetes mellitus with diabetic nephropathy, with long-term current use of insulin (HCC) CBG stable, reduced her Lantus dose by 40% patient will be nothing by mouth. Also on pre-meal aspart at home which is held  and monitor on sliding scale coverage. Last A1c of 11.5.  Uncontrolled GERD +/-diabetic gastroparesis Has persistent reflux symptoms. Underwent EGD with BRAVO capsule study in September 2018 for dysphagia. Continue PPI. Has been referred to [place] for second opinion and has appointment later this month.  Essential hypertension On multiple blood pressure medications for uncontrolled hypertension. Continue clonidine, ARB, Lasix and amlodipine. Discontinued HCTZ-triamterene and Aldactone given her chronic kidney disease and added Imdur along with when necessary hydralazine. Blood pressure currently stable.  Hypertriglyceridemia  Continue Zetia. add gemfibrozil upon discharge.    Sleep apnea Nighttime CPAP  Iron deficiency anemia Low normal iron level. add supplement upon discharge. Noted for drop in H&H. Stool for Hemoccult still pending.    Code Status : Full code  Family Communication  : Discussed with son at bedside yesterday.  Disposition Plan  : Home in 2 days if able to resume diet and advance  Barriers For Discharge : Active symptoms  Consults  :  None  Procedures  : CT abdomen  DVT Prophylaxis  : Subcutaneous heparin  Lab Results  Component Value Date   PLT 427 (H) 03/28/2017    Antibiotics  :    Anti-infectives (From admission, onward)   None        Objective:   Vitals:   03/27/17 0600 03/27/17 1500 03/27/17 2022 03/28/17 0453  BP: 133/65 130/72 (!) 159/74 (!) 156/75  Pulse: 77 83 63 62  Resp: 18 19 18 18   Temp: 98 F (36.7 C) 98.6 F (37 C) 98.4 F (36.9 C)   TempSrc: Oral Oral Oral   SpO2: 99% 95% 94% 97%  Weight:      Height:        Wt Readings from Last 3 Encounters:  03/24/17 95 kg (209 lb 7 oz)  03/19/17 94.3 kg (208 lb)  02/15/17 93.4 kg (206 lb)     Intake/Output Summary (Last 24 hours) at 03/28/2017 1030 Last data filed at 03/28/2017 0500 Gross per 24 hour  Intake 2620 ml  Output -  Net 2620 ml     Physical Exam Gen.: Obese  female in no acute distress HEENT: Moist regular, supple neck Chest: Clear bilaterally   CVS: Normal S1 and S2, no murmurs GI: Soft, nondistended, bowel sounds present, epigastric tenderness to pressure (better from yesterday) Musculoskeletal: Warm, no edema     Data Review:    CBC Recent Labs  Lab 03/24/17 1027 03/25/17 0408 03/26/17 0445 03/27/17 0815 03/28/17 0717  WBC 9.6 9.3 8.2 8.2 8.3  HGB 10.0* 8.7* 8.1* 8.0* 8.2*  HCT 32.9* 28.9* 27.8* 27.0* 27.9*  PLT 379 375 383 398 427*  MCV 73.6* 74.5* 74.9* 75.2* 75.6*  MCH 22.4* 22.4* 21.8* 22.3* 22.2*  MCHC 30.4 30.1 29.1* 29.6* 29.4*  RDW 16.0* 16.2* 16.6* 16.9* 17.3*  LYMPHSABS 1.9  --   --   --   --   MONOABS 0.7  --   --   --   --   EOSABS 0.1  --   --   --   --   BASOSABS 0.0  --   --   --   --     Chemistries  Recent Labs  Lab 03/24/17 1027 03/25/17 0408 03/26/17 0445 03/28/17 0717  NA 137 138 141 146*  K 4.4 4.6 4.4 4.4  CL 104 107 110 116*  CO2 21* 20* 20* 21*  GLUCOSE 374* 201* 142* 87  BUN 33* 34* 35* 30*  CREATININE 2.38* 2.47* 2.57* 2.47*  CALCIUM 9.0 8.4* 8.0* 8.5*  AST 14*  --   --   --   ALT 21  --   --   --   ALKPHOS 106  --   --   --   BILITOT 0.5  --   --   --    ------------------------------------------------------------------------------------------------------------------ No results for input(s): CHOL, HDL, LDLCALC, TRIG, CHOLHDL, LDLDIRECT in the last 72 hours.  Lab Results  Component Value Date   HGBA1C 11.5 03/19/2017   ------------------------------------------------------------------------------------------------------------------  No results for input(s): TSH, T4TOTAL, T3FREE, THYROIDAB in the last 72 hours.  Invalid input(s): FREET3 ------------------------------------------------------------------------------------------------------------------ No results for input(s): VITAMINB12, FOLATE, FERRITIN, TIBC, IRON, RETICCTPCT in the last 72 hours.  Coagulation profile No  results for input(s): INR, PROTIME in the last 168 hours.  No results for input(s): DDIMER in the last 72 hours.  Cardiac Enzymes Recent Labs  Lab 03/24/17 1027  TROPONINI <0.03   ------------------------------------------------------------------------------------------------------------------    Component Value Date/Time   BNP 51.0 02/14/2017 1122    Inpatient Medications  Scheduled Meds: . allopurinol  300 mg Oral Daily  . atorvastatin  20 mg Oral q1800  . calcitRIOL  0.25 mcg Oral Once per day on Mon Wed Fri  . ciprofloxacin-dexamethasone  4 drop Both EARS BID  . cloNIDine  0.1 mg Oral Q24H  . cyclobenzaprine  10 mg Oral QHS  . dicyclomine  10 mg Oral TID AC & HS  . ezetimibe  10 mg Oral Daily  . furosemide  40 mg Oral Daily  . heparin  5,000 Units Subcutaneous Q8H  . insulin aspart  0-20 Units Subcutaneous Q4H  . insulin detemir  100 Units Subcutaneous Q2200  . irbesartan  300 mg Oral Daily  . isosorbide mononitrate  30 mg Oral Daily  . labetalol  400 mg Oral BID  . montelukast  10 mg Oral QHS   Continuous Infusions: . sodium chloride 100 mL/hr at 03/27/17 2321   PRN Meds:.acetaminophen **OR** acetaminophen, azelastine, guaiFENesin-dextromethorphan, hydrALAZINE, HYDROcodone-acetaminophen, morphine injection, ondansetron **OR** ondansetron (ZOFRAN) IV  Micro Results No results found for this or any previous visit (from the past 240 hour(s)).  Radiology Reports Ct Abdomen Pelvis Wo Contrast  Result Date: 03/24/2017 CLINICAL DATA:  Back pain and vomiting. EXAM: CT ABDOMEN AND PELVIS WITHOUT CONTRAST TECHNIQUE: Multidetector CT imaging of the abdomen and pelvis was performed following the standard protocol without IV contrast. COMPARISON:  CT 10/10/2015 FINDINGS: Lower chest: Lung bases are clear. Hepatobiliary:  No focal hepatic lesion.  Gallbladder normal Pancreas: fullness in the pancreatic head measuring 4.0 x 4.7 cm (image 34, series 2). There is mild inflammatory  stranding in the adjacent retroperitoneal fat. No organized fluid collections. The body and tail the pancreas normal. No duct dilatation. Spleen: Normal spleen Adrenals/urinary tract: Low-density enlargement of the LEFT adrenal gland consistent small benign adenoma. Similar finding in the RIGHT adrenal gland which is slightly higher density cannot be fully characterized as benign adenoma but favor such. Kidneys ureters and bladder normal. Simple fluid attenuation cyst in the lower pole of the RIGHT kidney Stomach/Bowel: Stomach, duodenum, small-bowel and cecum normal. Appendix is normal. The colon and rectosigmoid colon are normal. Vascular/Lymphatic: Abdominal aorta is normal caliber. There is no retroperitoneal or periportal lymphadenopathy. No pelvic lymphadenopathy. Reproductive: Post hysterectomy Other: No free fluid. Musculoskeletal: No aggressive osseous lesion IMPRESSION: 1. New enlargement of the pancreatic head with peripancreatic inflammation is most consistent with focal pancreatitis. Recommend serum biochemical correlation. Additionally, recommend follow-up CT abdomen with contrast following resolution of acute symptomology (with 1 to 2 months) to exclude unlikely underlying lesion. 2. Bilateral adrenal adenomas. Electronically Signed   By: Suzy Bouchard M.D.   On: 03/24/2017 13:33   Dg Chest 2 View  Result Date: 03/24/2017 CLINICAL DATA:  Cough.  Hypertension. EXAM: CHEST  2 VIEW COMPARISON:  02/14/2017 FINDINGS: The heart size and mediastinal contours are within normal limits. Scarring identified within the left base. The visualized skeletal structures are unremarkable. IMPRESSION: No active cardiopulmonary disease. Electronically Signed  By: Kerby Moors M.D.   On: 03/24/2017 11:17    Time Spent in minutes  25   Dirk Vanaman M.D on 03/28/2017 at 10:30 AM  Between 7am to 7pm - Pager - [phone]  After 7pm go to [email or web address] - password [facility]  [facility] -  Office   [phone]

## 2017-03-29 LAB — GLUCOSE, CAPILLARY
GLUCOSE-CAPILLARY: 100 mg/dL — AB (ref 65–99)
GLUCOSE-CAPILLARY: 128 mg/dL — AB (ref 65–99)
GLUCOSE-CAPILLARY: 97 mg/dL (ref 65–99)
Glucose-Capillary: 88 mg/dL (ref 65–99)
Glucose-Capillary: 82 mg/dL (ref 65–99)
Glucose-Capillary: 105 mg/dL — ABNORMAL HIGH (ref 65–99)

## 2017-03-29 MED ORDER — INSULIN ASPART 100 UNIT/ML ~~LOC~~ SOLN
0.0000 [IU] | Freq: Every day | SUBCUTANEOUS | Status: DC
  Administered 2017-03-30: 4 [IU] via SUBCUTANEOUS

## 2017-03-29 MED ORDER — AMLODIPINE BESYLATE 5 MG PO TABS
10.0000 mg | ORAL_TABLET | Freq: Every day | ORAL | Status: DC
  Administered 2017-03-29 – 2017-03-31 (×3): 10 mg via ORAL
  Filled 2017-03-29 (×3): qty 2

## 2017-03-29 MED ORDER — INSULIN ASPART 100 UNIT/ML ~~LOC~~ SOLN
0.0000 [IU] | Freq: Three times a day (TID) | SUBCUTANEOUS | Status: DC
  Administered 2017-03-30 – 2017-03-31 (×3): 8 [IU] via SUBCUTANEOUS
  Administered 2017-03-31: 5 [IU] via SUBCUTANEOUS

## 2017-03-29 MED ORDER — LOPERAMIDE HCL 2 MG PO CAPS
2.0000 mg | ORAL_CAPSULE | ORAL | Status: DC | PRN
  Administered 2017-03-29: 2 mg via ORAL
  Filled 2017-03-29: qty 1

## 2017-03-29 NOTE — Progress Notes (Addendum)
PROGRESS NOTE                                                                                                                                                                                                             Patient Demographics:    Kathryn Beck, is a 62 y.o. female, DOB - 12/26/1955, FIE:[id number]  Admit date - 03/24/2017   Admitting Physician Louellen Molder, MD  Outpatient Primary MD for the patient is Fayrene Helper, MD  LOS - 5  Outpatient Specialists: Dr. Oneida Alar (GI) Dr Cruzita Lederer (Endocrinology)  Chief Complaint  Patient presents with  . 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    Subjective:   Reports her abdominal pain has continued to improve. Willing to try some clears today.  Assessment  & Plan :    Principal Problem:   Acute pancreatitis No clear etiology except for chronically elevated triglycerides (in 300s this admission, was >1300 , 1 month back. Pain worsened with diet advanced so was made nothing by mouth and given IV fluids and supportive care with pain medications. -Symptoms better after making her nothing by mouth last 2 days, will start clears today and slowly advanced. Discussed with her gastroenterologist (Dr. fields) recommend that she will need EUS as outpatient and will arrange it.   Active Problems:   Uncontrolled diabetes mellitus with diabetic nephropathy, with long-term current use of insulin (HCC) CBG stable, reduced her Lantus dose by 40% as patient remains  nothing by mouth. Also on pre-meal aspart at home which is held and monitor on sliding scale coverage. Last A1c of 11.5.  Uncontrolled GERD +/-diabetic gastroparesis Has persistent reflux symptoms. Underwent EGD with BRAVO capsule study in September 2018 for dysphagia. Continue PPI. Has been referred to [facility] for second opinion and has appointment later this month.  Essential hypertension On multiple blood pressure medications for uncontrolled hypertension. Continue clonidine, ARB, Lasix and amlodipine. Discontinued HCTZ-triamterene and Aldactone given her chronic kidney disease and  added Imdur along with when necessary hydralazine. Blood pressure currently stable.  Hypertriglyceridemia Continue Zetia. add gemfibrozil upon discharge.    Sleep apnea Nighttime CPAP  Iron deficiency anemia Low normal iron level. Needs supplement upon discharge. Noted for drop in H&H. Stool for Hemoccult negative.   Code Status : Full code  Family Communication  : None at bedside today  Disposition Plan  : Home possibly in next 2 days if able to tolerate diet with advancement.  Barriers For Discharge : Active symptoms  Consults  :  None  Procedures  : CT abdomen  DVT Prophylaxis  : Subcutaneous heparin  Lab Results  Component Value Date   PLT 427 (H) 03/28/2017    Antibiotics  :    Anti-infectives (From admission, onward)   None        Objective:   Vitals:   03/28/17 1400 03/28/17 2000 03/28/17 2310 03/29/17 0424  BP: (!) 161/76 (!) 151/62 (!) 157/75 (!) 154/78  Pulse: 62 87 68 72  Resp: 18   (!) 21  Temp: 98.3 F (36.8 C) 98 F (36.7 C)  98.4 F (36.9 C)  TempSrc: Oral Oral  Oral  SpO2: 95% 99%  95%  Weight:      Height:        Wt Readings from Last 3 Encounters:  03/24/17 95 kg (209 lb 7 oz)  03/19/17 94.3 kg (208 lb)  02/15/17 93.4 kg (206 lb)     Intake/Output Summary (Last 24 hours) at 03/29/2017 1127 Last data filed at 03/29/2017 0945 Gross per 24 hour    Intake 1545 ml  Output -  Net 1545 ml     Physical Exam Gen.: Obese female not in distress HEENT: Moist mucosa, supple neck Chest: Clear bilaterally  CVS: Normal S1 and S2, no murmurs GI: Soft, nondistended, nontender, bowel sounds present, minimal epigastric tenderness (improved from yesterday) Musculoskeletal : Warm, no edema      Data Review:    CBC Recent Labs  Lab 03/24/17 1027 03/25/17 0408 03/26/17 0445 03/27/17 0815 03/28/17 0717  WBC 9.6 9.3 8.2 8.2 8.3  HGB 10.0* 8.7* 8.1* 8.0* 8.2*  HCT 32.9* 28.9* 27.8* 27.0* 27.9*  PLT 379 375 383 398 427*  MCV 73.6* 74.5* 74.9* 75.2* 75.6*  MCH 22.4* 22.4* 21.8* 22.3* 22.2*  MCHC 30.4 30.1 29.1* 29.6* 29.4*  RDW 16.0* 16.2* 16.6* 16.9* 17.3*  LYMPHSABS 1.9  --   --   --   --   MONOABS 0.7  --   --   --   --   EOSABS 0.1  --   --   --   --   BASOSABS 0.0  --   --   --   --     Chemistries  Recent Labs  Lab 03/24/17 1027 03/25/17 0408 03/26/17 0445 03/28/17 0717  NA 137 138 141 146*  K 4.4 4.6 4.4 4.4  CL 104 107 110 116*  CO2 21* 20* 20* 21*  GLUCOSE 374* 201* 142* 87  BUN 33* 34* 35* 30*  CREATININE 2.38* 2.47* 2.57* 2.47*  CALCIUM 9.0 8.4* 8.0* 8.5*  AST 14*  --   --   --   ALT 21  --   --   --   ALKPHOS 106  --   --   --   BILITOT 0.5  --   --   --    ------------------------------------------------------------------------------------------------------------------ No results for input(s): CHOL, HDL, LDLCALC, TRIG, CHOLHDL, LDLDIRECT in the last 72  hours.  Lab Results  Component Value Date   HGBA1C 11.5 03/19/2017   ------------------------------------------------------------------------------------------------------------------ No results for input(s): TSH, T4TOTAL, T3FREE, THYROIDAB in the last 72 hours.  Invalid input(s): FREET3 ------------------------------------------------------------------------------------------------------------------ No results for input(s): VITAMINB12, FOLATE,  FERRITIN, TIBC, IRON, RETICCTPCT in the last 72 hours.  Coagulation profile No results for input(s): INR, PROTIME in the last 168 hours.  No results for input(s): DDIMER in the last 72 hours.  Cardiac Enzymes Recent Labs  Lab 03/24/17 1027  TROPONINI <0.03   ------------------------------------------------------------------------------------------------------------------    Component Value Date/Time   BNP 51.0 02/14/2017 1122    Inpatient Medications  Scheduled Meds: . allopurinol  300 mg Oral Daily  . atorvastatin  20 mg Oral q1800  . calcitRIOL  0.25 mcg Oral Once per day on Mon Wed Fri  . ciprofloxacin-dexamethasone  4 drop Both EARS BID  . cloNIDine  0.1 mg Oral Q24H  . cyclobenzaprine  10 mg Oral QHS  . dextromethorphan-guaiFENesin  1 tablet Oral BID  . dicyclomine  10 mg Oral TID AC & HS  . ezetimibe  10 mg Oral Daily  . furosemide  40 mg Oral Daily  . heparin  5,000 Units Subcutaneous Q8H  . insulin aspart  0-15 Units Subcutaneous TID WC  . insulin aspart  0-5 Units Subcutaneous QHS  . insulin detemir  60 Units Subcutaneous Q2200  . irbesartan  300 mg Oral Daily  . isosorbide mononitrate  30 mg Oral Daily  . labetalol  400 mg Oral BID  . montelukast  10 mg Oral QHS   Continuous Infusions: . sodium chloride 100 mL/hr at 03/29/17 0949   PRN Meds:.acetaminophen **OR** acetaminophen, azelastine, guaiFENesin-dextromethorphan, hydrALAZINE, HYDROcodone-acetaminophen, morphine injection, ondansetron **OR** ondansetron (ZOFRAN) IV  Micro Results No results found for this or any previous visit (from the past 240 hour(s)).  Radiology Reports Ct Abdomen Pelvis Wo Contrast  Result Date: 03/24/2017 CLINICAL DATA:  Back pain and vomiting. EXAM: CT ABDOMEN AND PELVIS WITHOUT CONTRAST TECHNIQUE: Multidetector CT imaging of the abdomen and pelvis was performed following the standard protocol without IV contrast. COMPARISON:  CT 10/10/2015 FINDINGS: Lower chest: Lung bases are  clear. Hepatobiliary:  No focal hepatic lesion.  Gallbladder normal Pancreas: fullness in the pancreatic head measuring 4.0 x 4.7 cm (image 34, series 2). There is mild inflammatory stranding in the adjacent retroperitoneal fat. No organized fluid collections. The body and tail the pancreas normal. No duct dilatation. Spleen: Normal spleen Adrenals/urinary tract: Low-density enlargement of the LEFT adrenal gland consistent small benign adenoma. Similar finding in the RIGHT adrenal gland which is slightly higher density cannot be fully characterized as benign adenoma but favor such. Kidneys ureters and bladder normal. Simple fluid attenuation cyst in the lower pole of the RIGHT kidney Stomach/Bowel: Stomach, duodenum, small-bowel and cecum normal. Appendix is normal. The colon and rectosigmoid colon are normal. Vascular/Lymphatic: Abdominal aorta is normal caliber. There is no retroperitoneal or periportal lymphadenopathy. No pelvic lymphadenopathy. Reproductive: Post hysterectomy Other: No free fluid. Musculoskeletal: No aggressive osseous lesion IMPRESSION: 1. New enlargement of the pancreatic head with peripancreatic inflammation is most consistent with focal pancreatitis. Recommend serum biochemical correlation. Additionally, recommend follow-up CT abdomen with contrast following resolution of acute symptomology (with 1 to 2 months) to exclude unlikely underlying lesion. 2. Bilateral adrenal adenomas. Electronically Signed   By: Suzy Bouchard M.D.   On: 03/24/2017 13:33   Dg Chest 2 View  Result Date: 03/24/2017 CLINICAL DATA:  Cough.  Hypertension. EXAM: CHEST  2 VIEW  COMPARISON:  02/14/2017 FINDINGS: The heart size and mediastinal contours are within normal limits. Scarring identified within the left base. The visualized skeletal structures are unremarkable. IMPRESSION: No active cardiopulmonary disease. Electronically Signed   By: Kerby Moors M.D.   On: 03/24/2017 11:17    Time Spent in minutes   25   Raeli Wiens M.D on 03/29/2017 at 11:27 AM  Between 7am to 7pm - Pager - [phone]  After 7pm go to [email or web address] - password [facility]  [facility] -  Office  [phone]

## 2017-03-30 LAB — GLUCOSE, CAPILLARY
GLUCOSE-CAPILLARY: 167 mg/dL — AB (ref 65–99)
Glucose-Capillary: 284 mg/dL — ABNORMAL HIGH (ref 65–99)
Glucose-Capillary: 277 mg/dL — ABNORMAL HIGH (ref 65–99)
Glucose-Capillary: 302 mg/dL — ABNORMAL HIGH (ref 65–99)

## 2017-03-30 NOTE — Care Management Note (Signed)
Case Management Note  Patient Details  Name: Kathryn Beck MRN: [id number] Date of Birth: 18-Feb-1956  If discussed at Long Length of Stay Meetings, dates discussed:  03/30/2017  Additional Comments:  Rawn Quiroa, Chauncey Reading, RN 03/30/2017, 12:06 PM

## 2017-03-30 NOTE — Plan of Care (Signed)
Pt progressing

## 2017-03-30 NOTE — Progress Notes (Signed)
PROGRESS NOTE                                                                                                                                                                                                             Patient Demographics:    Kathryn Beck, is a 62 y.o. female, DOB - 11-23-1955, [id number]  Admit date - 03/24/2017   Admitting Physician Louellen Molder, MD  Outpatient Primary MD for the patient is Fayrene Helper, MD  LOS - 6  Outpatient Specialists: Dr. Oneida Alar (GI) Dr Cruzita Lederer (Endocrinology)  Chief Complaint  Patient presents with  . 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    Subjective:   Abdominal pain continues to improve. Tolerating clears.  Assessment  & Plan :    Principal Problem:   Acute pancreatitis No clear etiology except for chronically elevated triglycerides (in 300s this admission, was >1300 , 1 month back. Pain worsened with diet advanced so was made nothing by mouth and given IV fluids and supportive care with pain medications. -Symptoms better after making her nothing by mouth last 2 days, started clears yesterday and tolerating well. We'll advance to full liquid today, then possibly to soft diet tomorrow and discharge her home.  Discussed with her gastroenterologist (Dr. fields) recommend that she will need EUS as outpatient and will arrange it.   Active Problems:   Uncontrolled diabetes mellitus with diabetic nephropathy, with long-term current use of insulin  (HCC) CBG stable, reduced her Lantus dose by 40% as patient remains nothing by mouth. Also on pre-meal aspart at home which is held and monitor on sliding scale coverage. Last A1c of 11.5.  Uncontrolled GERD +/-diabetic gastroparesis Has persistent reflux symptoms. Underwent EGD with BRAVO capsule study in September 2018 for dysphagia. Continue PPI. Has been referred to [place] for second opinion and has appointment later this month.  Essential hypertension On multiple blood pressure medications for uncontrolled hypertension. Continue clonidine, ARB, Lasix and amlodipine. Discontinued  HCTZ-triamterene and Aldactone given her chronic kidney disease and added Imdur along with when necessary hydralazine. Blood pressure currently stable.  Hypertriglyceridemia Continue Zetia. add gemfibrozil upon discharge.    Sleep apnea Nighttime CPAP  Iron deficiency anemia Low normal iron level. Needs supplement upon discharge. Noted for drop in H&H. Stool for Hemoccult negative.   Code Status : Full code  Family Communication  : None at bedside today. Son involved in care.  Disposition Plan  : Home possibly tomorrow if tolerating advanced diet.  Barriers For Discharge : Active symptoms  Consults  :  None  Procedures  : CT abdomen  DVT Prophylaxis  : Subcutaneous heparin  Lab Results  Component Value Date   PLT 427 (H) 03/28/2017    Antibiotics  :    Anti-infectives (From admission, onward)   None        Objective:   Vitals:   03/29/17 1843 03/29/17 2052 03/30/17 0558 03/30/17 1108  BP: (!) 164/76 (!) 156/64 (!) 148/64 (!) 154/60  Pulse: 68 69 66   Resp:  18 18   Temp:  97.8 F (36.6 C) 98.4 F (36.9 C)   TempSrc:  Oral Oral   SpO2:  97% 98%   Weight:      Height:        Wt Readings from Last 3 Encounters:  03/24/17 95 kg (209 lb 7 oz)  03/19/17 94.3 kg (208 lb)  02/15/17 93.4 kg (206 lb)     Intake/Output Summary (Last 24 hours) at 03/30/2017 1122 Last data  filed at 03/30/2017 0300 Gross per 24 hour  Intake 1725 ml  Output -  Net 1725 ml     Physical Exam General: Obese female not in distress HEENT: Moist mucosa, supple neck Chest: Clear bilaterally CVS: Normal S1 and S2, no murmurs GI: Soft, nondistended, mild epigastric tenderness, bowel sounds present neck line musculoskeletal : Warm, no edema      Data Review:    CBC Recent Labs  Lab 03/24/17 1027 03/25/17 0408 03/26/17 0445 03/27/17 0815 03/28/17 0717  WBC 9.6 9.3 8.2 8.2 8.3  HGB 10.0* 8.7* 8.1* 8.0* 8.2*  HCT 32.9* 28.9* 27.8* 27.0* 27.9*  PLT 379 375 383 398 427*  MCV 73.6* 74.5* 74.9* 75.2* 75.6*  MCH 22.4* 22.4* 21.8* 22.3* 22.2*  MCHC 30.4 30.1 29.1* 29.6* 29.4*  RDW 16.0* 16.2* 16.6* 16.9* 17.3*  LYMPHSABS 1.9  --   --   --   --   MONOABS 0.7  --   --   --   --   EOSABS 0.1  --   --   --   --   BASOSABS 0.0  --   --   --   --     Chemistries  Recent Labs  Lab 03/24/17 1027 03/25/17 0408 03/26/17 0445 03/28/17 0717  NA 137 138 141 146*  K 4.4 4.6 4.4 4.4  CL 104 107 110 116*  CO2 21* 20* 20* 21*  GLUCOSE 374* 201* 142* 87  BUN 33* 34* 35* 30*  CREATININE 2.38* 2.47* 2.57* 2.47*  CALCIUM 9.0 8.4* 8.0* 8.5*  AST 14*  --   --   --   ALT 21  --   --   --   ALKPHOS 106  --   --   --   BILITOT 0.5  --   --   --    ------------------------------------------------------------------------------------------------------------------ No results for input(s): CHOL, HDL, LDLCALC, TRIG, CHOLHDL, LDLDIRECT in the last 72 hours.  Lab Results  Component Value Date   HGBA1C 11.5 03/19/2017   ------------------------------------------------------------------------------------------------------------------ No results for input(s): TSH, T4TOTAL, T3FREE, THYROIDAB in the last 72 hours.  Invalid input(s): FREET3 ------------------------------------------------------------------------------------------------------------------ No results for input(s): VITAMINB12,  FOLATE, FERRITIN, TIBC, IRON, RETICCTPCT in the last 72 hours.  Coagulation profile No results for input(s): INR, PROTIME in the last 168 hours.  No results for input(s): DDIMER in the last 72 hours.  Cardiac Enzymes Recent Labs  Lab 03/24/17 1027  TROPONINI <0.03   ------------------------------------------------------------------------------------------------------------------    Component Value Date/Time   BNP 51.0 02/14/2017 1122    Inpatient Medications  Scheduled Meds: . allopurinol  300 mg Oral Daily  . amLODipine  10 mg Oral Daily  . atorvastatin  20 mg Oral q1800  . calcitRIOL  0.25 mcg Oral Once per day on Mon Wed Fri  . ciprofloxacin-dexamethasone  4 drop Both EARS BID  . cloNIDine  0.1 mg Oral Q24H  . cyclobenzaprine  10 mg Oral QHS  . dextromethorphan-guaiFENesin  1 tablet Oral BID  . dicyclomine  10 mg Oral TID AC & HS  . ezetimibe  10 mg Oral Daily  . furosemide  40 mg Oral Daily  . heparin  5,000 Units Subcutaneous Q8H  . insulin aspart  0-15 Units Subcutaneous TID WC  . insulin aspart  0-5 Units Subcutaneous QHS  . insulin detemir  60 Units Subcutaneous Q2200  . irbesartan  300 mg Oral Daily  . isosorbide mononitrate  30 mg Oral Daily  . labetalol  400 mg Oral BID  . montelukast  10 mg Oral QHS   Continuous Infusions: . sodium chloride 100 mL/hr at 03/30/17 0732   PRN Meds:.acetaminophen **OR** acetaminophen, azelastine, guaiFENesin-dextromethorphan, hydrALAZINE, HYDROcodone-acetaminophen, loperamide, morphine injection, ondansetron **OR** ondansetron (ZOFRAN) IV  Micro Results No results found for this or any previous visit (from the past 240 hour(s)).  Radiology Reports Ct Abdomen Pelvis Wo Contrast  Result Date: 03/24/2017 CLINICAL DATA:  Back pain and vomiting. EXAM: CT ABDOMEN AND PELVIS WITHOUT CONTRAST TECHNIQUE: Multidetector CT imaging of the abdomen and pelvis was performed following the standard protocol without IV contrast. COMPARISON:   CT 10/10/2015 FINDINGS: Lower chest: Lung bases are clear. Hepatobiliary:  No focal hepatic lesion.  Gallbladder normal Pancreas: fullness in the pancreatic head measuring 4.0 x 4.7 cm (image 34, series 2). There is mild inflammatory stranding in the adjacent retroperitoneal fat. No organized fluid collections. The body and tail the pancreas normal. No duct dilatation. Spleen: Normal spleen Adrenals/urinary tract: Low-density enlargement of the LEFT adrenal gland consistent small benign adenoma. Similar finding in the RIGHT adrenal gland which is slightly higher density cannot be fully characterized as benign adenoma but favor such. Kidneys ureters and bladder normal. Simple fluid attenuation cyst in the lower pole of the RIGHT kidney Stomach/Bowel: Stomach, duodenum, small-bowel and cecum normal. Appendix is normal. The colon and rectosigmoid colon are normal. Vascular/Lymphatic: Abdominal aorta is normal caliber. There is no retroperitoneal or periportal lymphadenopathy. No pelvic lymphadenopathy. Reproductive: Post hysterectomy Other: No free fluid. Musculoskeletal: No aggressive osseous lesion IMPRESSION: 1. New enlargement of the pancreatic head with peripancreatic inflammation is most consistent with focal pancreatitis. Recommend serum biochemical correlation. Additionally, recommend follow-up CT abdomen with contrast following resolution of acute symptomology (with 1 to 2 months) to exclude unlikely underlying lesion. 2. Bilateral adrenal adenomas. Electronically Signed   By: Suzy Bouchard M.D.   On: 03/24/2017 13:33   Dg Chest 2 View  Result Date: 03/24/2017 CLINICAL DATA:  Cough.  Hypertension. EXAM: CHEST  2 VIEW COMPARISON:  02/14/2017 FINDINGS: The heart size and mediastinal contours are within normal limits. Scarring identified within the left base. The visualized skeletal structures are unremarkable. IMPRESSION: No active cardiopulmonary disease. Electronically Signed   By: Kerby Moors M.D.    On: 03/24/2017 11:17    Time Spent in minutes  25   Lannette Avellino M.D on 03/30/2017 at 11:22 AM  Between 7am to 7pm - Pager - [phone]  After 7pm go to [email or web address] - password [facility]  [facility] -  Office  [phone]

## 2017-03-31 DIAGNOSIS — K210 Gastro-esophageal reflux disease with esophagitis: Secondary | ICD-10-CM

## 2017-03-31 DIAGNOSIS — I10 Essential (primary) hypertension: Secondary | ICD-10-CM

## 2017-03-31 DIAGNOSIS — D638 Anemia in other chronic diseases classified elsewhere: Secondary | ICD-10-CM

## 2017-03-31 DIAGNOSIS — K8500 Idiopathic acute pancreatitis without necrosis or infection: Secondary | ICD-10-CM

## 2017-03-31 LAB — COMPREHENSIVE METABOLIC PANEL
ALBUMIN: 2.5 g/dL — AB (ref 3.5–5.0)
ALK PHOS: 117 U/L (ref 38–126)
ALT: 24 U/L (ref 14–54)
ANION GAP: 9 (ref 5–15)
AST: 21 U/L (ref 15–41)
BUN: 20 mg/dL (ref 6–20)
CHLORIDE: 113 mmol/L — AB (ref 101–111)
CO2: 21 mmol/L — AB (ref 22–32)
Calcium: 8.4 mg/dL — ABNORMAL LOW (ref 8.9–10.3)
Creatinine, Ser: 2.22 mg/dL — ABNORMAL HIGH (ref 0.44–1.00)
GFR calc non Af Amer: 23 mL/min — ABNORMAL LOW (ref >60)
GFR, EST AFRICAN AMERICAN: 27 mL/min — AB (ref >60)
GLUCOSE: 270 mg/dL — AB (ref 65–99)
POTASSIUM: 3.6 mmol/L (ref 3.5–5.1)
SODIUM: 143 mmol/L (ref 135–145)
Total Bilirubin: 0.1 mg/dL — ABNORMAL LOW (ref 0.3–1.2)
Total Protein: 6 g/dL — ABNORMAL LOW (ref 6.5–8.1)

## 2017-03-31 LAB — LIPASE, BLOOD: Lipase: 143 U/L — ABNORMAL HIGH (ref 11–51)

## 2017-03-31 LAB — GLUCOSE, CAPILLARY
GLUCOSE-CAPILLARY: 286 mg/dL — AB (ref 65–99)
Glucose-Capillary: 245 mg/dL — ABNORMAL HIGH (ref 65–99)

## 2017-03-31 MED ORDER — GEMFIBROZIL 600 MG PO TABS: 600.0000 mg | ORAL_TABLET | Freq: Two times a day (BID) | ORAL | 1 refills | Status: DC

## 2017-03-31 MED ORDER — FERROUS SULFATE 325 (65 FE) MG PO TABS: 325.0000 mg | ORAL_TABLET | Freq: Every day | ORAL | Status: DC

## 2017-03-31 MED ORDER — ISOSORBIDE MONONITRATE ER 30 MG PO TB24: 30.0000 mg | ORAL_TABLET | Freq: Every day | ORAL | 0 refills | Status: DC

## 2017-03-31 MED ORDER — FERROUS SULFATE 325 (65 FE) MG PO TABS: 325.0000 mg | ORAL_TABLET | Freq: Every day | ORAL | 3 refills | Status: DC

## 2017-03-31 MED ORDER — GEMFIBROZIL 600 MG PO TABS: 600.0000 mg | ORAL_TABLET | Freq: Two times a day (BID) | ORAL | Status: DC

## 2017-03-31 NOTE — Plan of Care (Signed)
Pt progressing

## 2017-03-31 NOTE — Discharge Summary (Signed)
Physician Discharge Summary      Patient: Kathryn Beck                   Admit date: 03/24/2017   DOB: 1955-06-16             Discharge date:03/31/2017/11:01 AM OMB:[id number]                           PCP: Fayrene Helper, MD Recommendations for Outpatient Follow-up:   1. Please follow-up with your primary care physician within 1-2 weeks.   Discharge Condition: Stable  CODE STATUS:  Full code  Diet recommendation:  Cardiac diet / Diabetic diet ----------------------------------------------------------------------------------------------------------------------  Discharge Diagnoses:   Principal Problem:   Acute pancreatitis Active Problems:   Uncontrolled diabetes mellitus with diabetic nephropathy, with long-term current use of insulin (HCC)   Obesity   Essential hypertension   Sleep apnea   GERD (gastroesophageal reflux disease)   IBS (irritable bowel syndrome)   Uncontrolled hypertension   Anemia of chronic disease   History of present illness : Kathryn Beck  is a 62 y.o. female, with history of IBS,? 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 Patient reports sharp epigastric pain frequently radiating to the mid back worsened with food intake. Symptoms have been progressive in the past 2 weeks associated with nausea and 2 episodes of vomiting yesterday. She denies any fevers, chills, headache, blurred vision, dizziness, chest pain, shortness of breath, palpitations, diarrhea, dysuria, muscle or joint aches. Denies weight loss. Patient reports having ongoing GERD symptoms for which she's had EGD and BRAVO capsule  study showed mild gastritis only. She has been referred to [facility] for ongoing GERD symptoms (has appointment on 1/25). Patient denies use of alcohol, recent change in her medications or NSAIDs use. Course in the ED Patient was hypertensive with blood pressure as high as 218/93 mmHg, blood work showed normal WBC, hemoglobin of 10, chemistry showed BUN of 33 and creatinine of 2.38 (around baseline), normal LFTs and mildly elevated lipase of 107. Blood glucose was elevated to 374.  CT of the abdomen and pelvis without contrast showed enlargement of the pancreatic head with prevent getting inflammation consistent with focal pancreatitis.  Hospital course / Brief Summary:  Acute pancreatitis -No clear etiology except for chronically elevated triglycerides (in 300s this admission, was >1300 , 1 month back. Pain worsened with diet advanced so was made nothing by mouth and given IV fluids and supportive care with pain medications -    -Symptoms better after making her nothing by mouth last 2 days, started clears yesterday and tolerating well. Was advanced to full liquid , then to soft diet, Now she is tolerating Full diet PO  and ready to be discharged her home.  Discussed with her gastroenterologist (Dr. fields) recommend that she will need EUS as outpatient and will arrange it.      Uncontrolled diabetes mellitus with diabetic nephropathy, with long-term current use of insulin ([facility]) since since she is still advancing her diet, able recommending to continue current home regimen Recommended to keep a log of her blood sugar checks, medications need to be adjusted for better  glycemic control. Strict diabetic diet recommended Last A1c of 11.5.  Uncontrolled GERD +/-diabetic gastroparesis Has persistent reflux symptoms. Underwent EGD with BRAVO capsule study in September 2018 for dysphagia. Continue PPI. Has been referred to [place] for second opinion and has appointment later this  month.  Essential hypertension On multiple blood pressure medications for uncontrolled hypertension. Continue clonidine, ARB, Lasix and amlodipine. Discontinued HCTZ-triamterene and Aldactone given her chronic kidney disease and added Imdur along with when necessary hydralazine. Blood pressure currently stable.  Hypertriglyceridemia Continue Zetia. add gemfibrozil upon discharge.  Sleep apnea - Nighttime CPA   Iron deficiency anemia Low normal iron level. Needs supplement upon discharge. Noted for drop in H&H. Stool for Hemoccult negative.   Consultations: None   Procedures: No admission procedures for hospital encounter.   ----------------------------------------------------------------------------------------------------------------------  Discharge Instructions:   Discharge Instructions    Activity as tolerated - No restrictions   Complete by:  As directed    Call MD for:  difficulty breathing, headache or visual disturbances   Complete by:  As directed    Call MD for:  hives   Complete by:  As directed    Call MD for:  persistant nausea and vomiting   Complete by:  As directed    Call MD for:  redness, tenderness, or signs of infection (pain, swelling, redness, odor or green/yellow discharge around incision site)   Complete by:  As directed    Call MD for:  severe uncontrolled pain   Complete by:  As directed    Diet - low sodium heart healthy   Complete by:  As directed    Diet Carb Modified   Complete by:  As directed    Discharge instructions   Complete by:  As directed    F/up with PCP in 1 wk   Increase activity slowly   Complete by:  As directed        Medication List    STOP taking these medications   ACCU-CHEK FASTCLIX LANCETS Misc   acetaminophen 325 MG tablet Commonly known as:  TYLENOL   cyclobenzaprine 10 MG tablet Commonly known as:  FLEXERIL   pantoprazole 40 MG tablet Commonly known as:  PROTONIX   pseudoephedrine 30 MG  tablet Commonly known as:  SUDAFED   spironolactone 25 MG tablet Commonly known as:  ALDACTONE   triamterene-hydrochlorothiazide 75-50 MG tablet Commonly known as:  MAXZIDE     TAKE these medications   ALKA-SELTZER PLS ALLERGY & CGH PO Take 2 tablets by mouth daily as needed (cold).   allopurinol 300 MG tablet Commonly known as:  ZYLOPRIM TAKE 1 TABLET (300 MG TOTAL) BY MOUTH DAILY.   amLODipine 10 MG tablet Commonly known as:  NORVASC TAKE 1 TABLET BY MOUTH  DAILY   atorvastatin 20 MG tablet Commonly known as:  LIPITOR Take 1 tablet (20 mg total) by mouth daily.   azelastine 0.1 % nasal spray Commonly known as:  ASTELIN Place 2 sprays into both nostrils 2 (two) times daily. Use in each nostril as directed What changed:    when to take this  reasons to take this  additional instructions   calcitRIOL 0.25 MCG capsule Commonly known as:  ROCALTROL One capsule three times weekly ( Monday, Wednesday, Friday)   ciprofloxacin-dexamethasone OTIC suspension Commonly known as:  [place] 4 drops into both ears 2 (two) times daily.   cloNIDine 0.1 MG tablet Commonly known as:  CATAPRES TAKE 1 TABLET BY MOUTH  EVERY EVENING AT 9PM  FOR  BLOOD PRESSURE   dicyclomine 10 MG capsule Commonly known as:  BENTYL 1 po qachs if needed to prevent diarrhea and abdominal cramps   ezetimibe 10 MG tablet Commonly known as:  ZETIA Take 1 tablet (10 mg total) by mouth daily.   furosemide 40 MG tablet Commonly known as:  LASIX Take 1 tablet (40 mg total) by mouth daily.   glucose blood test strip Commonly known as:  ACCU-CHEK AVIVA PLUS USE 4 TIMES DAILY E11.65   Insulin Detemir 100 UNIT/ML Pen Commonly known as:  LEVEMIR Inject 100 Units into the skin daily at 10 pm.   Insulin Lispro 200 UNIT/ML Sopn Commonly known as:  HUMALOG KWIKPEN Inject 26-34 Units into the skin 3 (three) times daily before meals.   Insulin Pen Needle 31G X 5 MM Misc Commonly known as:  B-D UF  III MINI PEN NEEDLES USE AS INSTRUCTED THREE  TIMES DAILY   isosorbide mononitrate 30 MG 24 hr tablet Commonly known as:  IMDUR Take 1 tablet (30 mg total) by mouth daily. Start taking on:  04/01/2017   labetalol 200 MG tablet Commonly known as:  NORMODYNE Take 2 tablets (400 mg total) by mouth 2 (two) times daily.   montelukast 10 MG tablet Commonly known as:  SINGULAIR Take 10 mg by mouth at bedtime.   olmesartan 40 MG tablet Commonly known as:  BENICAR Take 1 tablet (40 mg total) by mouth daily.   Vitamin D (Ergocalciferol) 50000 units Caps capsule Commonly known as:  DRISDOL TAKE 1 CAPSULE (50,000 UNITS TOTAL) BY MOUTH EVERY 7 (SEVEN) DAYS. What changed:  additional instructions   VITAMIN E PO Take 1 tablet by mouth daily.       Allergies  Allergen Reactions  . Ace Inhibitors Anaphylaxis and Swelling  . Penicillins Itching and Swelling    Has patient had a PCN reaction causing immediate rash, facial/tongue/throat swelling, SOB or lightheadedness with hypotension: Yes Has patient had a PCN reaction causing severe rash involving mucus membranes or skin necrosis: No Has patient had a PCN reaction that required hospitalization No Has patient had a PCN reaction occurring within the last 10 years: No If all of the above answers are "NO", then may proceed with Cephalosporin use.   . Statins Other (See Comments)    elevated LFT's      Procedures/Studies: Ct Abdomen Pelvis Wo Contrast  Result Date: 03/24/2017 CLINICAL DATA:  Back pain and vomiting. EXAM: CT ABDOMEN AND PELVIS WITHOUT CONTRAST TECHNIQUE: Multidetector CT imaging of the abdomen and pelvis was performed following the standard protocol without IV contrast. COMPARISON:  CT 10/10/2015 FINDINGS: Lower chest: Lung bases are clear. Hepatobiliary:  No focal hepatic lesion.  Gallbladder normal Pancreas: fullness in the pancreatic head measuring 4.0 x 4.7 cm (image 34, series 2). There is mild inflammatory stranding in  the adjacent retroperitoneal fat. No organized fluid collections. The body and tail the pancreas normal. No duct dilatation. Spleen: Normal spleen Adrenals/urinary tract: Low-density enlargement of the LEFT adrenal gland consistent small benign adenoma. Similar finding in the RIGHT adrenal gland which is slightly higher density cannot be fully characterized as benign adenoma but favor such. Kidneys ureters and bladder normal. Simple fluid attenuation cyst in the lower pole of the RIGHT kidney Stomach/Bowel: Stomach, duodenum, small-bowel and cecum normal. Appendix is normal. The colon and rectosigmoid colon are normal. Vascular/Lymphatic: Abdominal aorta is normal caliber. There is no retroperitoneal or periportal lymphadenopathy. No pelvic lymphadenopathy. Reproductive: Post hysterectomy Other: No free fluid.  Musculoskeletal: No aggressive osseous lesion IMPRESSION: 1. New enlargement of the pancreatic head with peripancreatic inflammation is most consistent with focal pancreatitis. Recommend serum biochemical correlation. Additionally, recommend follow-up CT abdomen with contrast following resolution of acute symptomology (with 1 to 2 months) to exclude unlikely underlying lesion. 2. Bilateral adrenal adenomas. Electronically Signed   By: Suzy Bouchard M.D.   On: 03/24/2017 13:33   Dg Chest 2 View  Result Date: 03/24/2017 CLINICAL DATA:  Cough.  Hypertension. EXAM: CHEST  2 VIEW COMPARISON:  02/14/2017 FINDINGS: The heart size and mediastinal contours are within normal limits. Scarring identified within the left base. The visualized skeletal structures are unremarkable. IMPRESSION: No active cardiopulmonary disease. Electronically Signed   By: Kerby Moors M.D.   On: 03/24/2017 11:17      Subjective: Patient was seen and examined 03/31/2017, 11:01 AM Patient stable  Today. No acute distress.  No issues overnight Stable for discharge.  Discharge Exam:  Vitals:   03/30/17 1800 03/30/17 2201  03/31/17 0545 03/31/17 0900  BP: (!) 126/56 116/86 (!) 171/89 (!) 155/70  Pulse: 86 99 75 80  Resp: 17 16 16 15   Temp: 98.6 F (37 C) 99.4 F (37.4 C) 98.8 F (37.1 C) 98.5 F (36.9 C)  TempSrc: Oral Oral Oral Oral  SpO2: 98% 98% 100% 99%  Weight:      Height:        General: Pt lying comfortably in bed & appears in no obvious distress. Cardiovascular: S1 & S2 heard, RRR, S1/S2 +. No murmurs, rubs, gallops or clicks. No JVD or pedal edema. Respiratory: Clear to auscultation without wheezing, rhonchi or crackles. No increased work of breathing. Abdominal:  Non distended, non tender & soft. No organomegaly or masses appreciated. Normal bowel sounds heard. CNS: Alert and oriented. No focal deficits. Extremities: no edema, no cyanosis    The results of significant diagnostics from this hospitalization (including imaging, microbiology, ancillary and laboratory) are listed below for reference.     Microbiology: No results found for this or any previous visit (from the past 240 hour(s)).   Labs: CBC: Recent Labs  Lab 03/25/17 0408 03/26/17 0445 03/27/17 0815 03/28/17 0717  WBC 9.3 8.2 8.2 8.3  HGB 8.7* 8.1* 8.0* 8.2*  HCT 28.9* 27.8* 27.0* 27.9*  MCV 74.5* 74.9* 75.2* 75.6*  PLT 375 383 398 409*   Basic Metabolic Panel: Recent Labs  Lab 03/25/17 0408 03/26/17 0445 03/28/17 0717 03/31/17 0845  NA 138 141 146* 143  K 4.6 4.4 4.4 3.6  CL 107 110 116* 113*  CO2 20* 20* 21* 21*  GLUCOSE 201* 142* 87 270*  BUN 34* 35* 30* 20  CREATININE 2.47* 2.57* 2.47* 2.22*  CALCIUM 8.4* 8.0* 8.5* 8.4*   Liver Function Tests: Recent Labs  Lab 03/31/17 0845  AST 21  ALT 24  ALKPHOS 117  BILITOT 0.1*  PROT 6.0*  ALBUMIN 2.5*   BNP (last 3 results) Recent Labs    02/14/17 1122  BNP 51.0   Cardiac Enzymes: No results for input(s): CKTOTAL, CKMB, CKMBINDEX, TROPONINI in the last 168 hours. CBG: Recent Labs  Lab 03/30/17 0733 03/30/17 1136 03/30/17 1659  03/30/17 2159 03/31/17 0734  GLUCAP 167* 284* 277* 302* 245*   Hgb A1c No results for input(s): HGBA1C in the last 72 hours. Lipid Profile No results for input(s): CHOL, HDL, LDLCALC, TRIG, CHOLHDL, LDLDIRECT in the last 72 hours. Thyroid function studies No results for input(s): TSH, T4TOTAL, T3FREE, THYROIDAB in the last 72 hours.  Invalid input(s): FREET3 Anemia work up No results for input(s): VITAMINB12, FOLATE, FERRITIN, TIBC, IRON, RETICCTPCT in the last 72 hours. Urinalysis    Component Value Date/Time   COLORURINE YELLOW 03/24/2017 0951   APPEARANCEUR HAZY (A) 03/24/2017 0951   LABSPEC 1.022 03/24/2017 0951   PHURINE 5.0 03/24/2017 0951   GLUCOSEU >=500 (A) 03/24/2017 0951   HGBUR NEGATIVE 03/24/2017 0951   BILIRUBINUR NEGATIVE 03/24/2017 0951   BILIRUBINUR neg 02/15/2017 0913   KETONESUR NEGATIVE 03/24/2017 0951   PROTEINUR >=300 (A) 03/24/2017 0951   UROBILINOGEN 0.2 02/15/2017 0913   NITRITE NEGATIVE 03/24/2017 0951   LEUKOCYTESUR NEGATIVE 03/24/2017 0951      Time coordinating discharge: Over 30 minutes  SIGNED: Deatra James, MD, FACP, FHM. [facility] Pager [phone]  If 7PM-7AM, please contact night-coverage [email or web address] Password [email or web address] 03/31/2017, 11:01 AM

## 2017-03-31 NOTE — Progress Notes (Signed)
Inpatient Diabetes Program Recommendations  AACE/ADA: New Consensus Statement on Inpatient Glycemic Control (2015)  Target Ranges:  Prepandial:   less than 140 mg/dL      Peak postprandial:   less than 180 mg/dL (1-2 hours)      Critically ill patients:  140 - 180 mg/dL   Results for Kathryn Beck, Kathryn Beck (MRN [id number]) as of 03/31/2017 09:40  Ref. Range 03/30/2017 07:33 03/30/2017 11:36 03/30/2017 16:59 03/30/2017 21:59 03/31/2017 07:34  Glucose-Capillary Latest Ref Range: 65 - 99 mg/dL 167 (H) 284 (H) 277 (H) 302 (H) 245 (H)   Review of Glycemic Control  Diabetes history: DM2 Outpatient Diabetes medications: Levemir 100 units QHS, Humalog 22-26 units TID with meals Current orders for Inpatient glycemic control: Levemir 60 units QHS, Novolog 0-15 units TID with meals, Novolog 0-5 units QHS  Inpatient Diabetes Program Recommendations:  Insulin - Basal:  Please consider increasing Levemir to 63 units QHS. Insulin-Meal Coverage: If patient is eating at least 50% of meals, please consider ordering Novolog 5 units TID with meals for meal coverage. HgbA1C: A1C 11.5% on 03/19/17 indicating an average glucose of 283 mg/d. Prior A1C was 12.5% on 11/25/16. Patient is followed by Dr. Cruzita Lederer (Endocrinologist) and needs to follow up regarding DM control.  Thanks, Barnie Alderman, RN, MSN, CDE Diabetes Coordinator Inpatient Diabetes Program [phone] (Team Pager from 8am to 5pm)

## 2017-04-05 ENCOUNTER — Ambulatory Visit (INDEPENDENT_AMBULATORY_CARE_PROVIDER_SITE_OTHER): Payer: 59 | Admitting: Otolaryngology

## 2017-04-05 DIAGNOSIS — H6123 Impacted cerumen, bilateral: Secondary | ICD-10-CM

## 2017-04-05 DIAGNOSIS — H9011 Conductive hearing loss, unilateral, right ear, with unrestricted hearing on the contralateral side: Secondary | ICD-10-CM

## 2017-04-05 DIAGNOSIS — H6981 Other specified disorders of Eustachian tube, right ear: Secondary | ICD-10-CM

## 2017-04-05 DIAGNOSIS — H6521 Chronic serous otitis media, right ear: Secondary | ICD-10-CM | POA: Diagnosis not present

## 2017-04-06 ENCOUNTER — Other Ambulatory Visit: Payer: Self-pay | Admitting: Otolaryngology

## 2017-04-07 ENCOUNTER — Telehealth: Payer: Self-pay | Admitting: Gastroenterology

## 2017-04-07 NOTE — Telephone Encounter (Signed)
Pt is aware.  

## 2017-04-07 NOTE — Telephone Encounter (Signed)
LMOM to call.

## 2017-04-07 NOTE — Telephone Encounter (Signed)
PLEASE CALL PT. SHE HAD AN EPISODE OF PANCREATITIS. SHE NEEDS TO STRICTLY FOLLOW A LOW FAT/DIABETIC DIET. SHE NEEDS AN ENDOSCOPIC ULTRASOUND TO LOOK AT HER PANCREAS AFTER FEB 2. SHE HAS TO GO TO [place] FOR THIS. IF SHE NEEDS A HANDOUT REGARDING THE LOW FAT PORTION OF THE DIET, SHE CAN PICK ONE UP OR WE CAN MAIL IT. SHE CAN  SEE ME IN THE OFC TO DISCUSS THE PLAN JAN 24 @1130 .

## 2017-04-07 NOTE — Telephone Encounter (Signed)
Pt is aware and I am mailing her a low fat diet. She would like the appt on 04/15/2017 at 11:30 AM with Dr. Oneida Alar. ( forwarding to [place] to put on schedule). She is aware she will need the Endoscopic [place] after 04/24/2017 in [place].  She will discuss with Dr. Oneida Alar on 04/15/2017. Pt said she already has an appt at [facility] on 04/16/2017 and wants to know if she should keep it. Dr. Oneida Alar, please advise!

## 2017-04-07 NOTE — Telephone Encounter (Signed)
YES SHE SHOULD KEEP APPT AT [facility].

## 2017-04-08 NOTE — Telephone Encounter (Signed)
PATIENT PUT ON SCHEDULE

## 2017-04-15 ENCOUNTER — Other Ambulatory Visit: Payer: Self-pay

## 2017-04-15 ENCOUNTER — Encounter: Payer: Self-pay | Admitting: Gastroenterology

## 2017-04-15 ENCOUNTER — Ambulatory Visit (INDEPENDENT_AMBULATORY_CARE_PROVIDER_SITE_OTHER): Payer: 59 | Admitting: Gastroenterology

## 2017-04-15 DIAGNOSIS — K8500 Idiopathic acute pancreatitis without necrosis or infection: Secondary | ICD-10-CM

## 2017-04-15 DIAGNOSIS — R1310 Dysphagia, unspecified: Secondary | ICD-10-CM

## 2017-04-15 DIAGNOSIS — K219 Gastro-esophageal reflux disease without esophagitis: Secondary | ICD-10-CM | POA: Diagnosis not present

## 2017-04-15 DIAGNOSIS — R1319 Other dysphagia: Secondary | ICD-10-CM

## 2017-04-15 MED ORDER — DEXLANSOPRAZOLE 60 MG PO CPDR: DELAYED_RELEASE_CAPSULE | ORAL | 11 refills | Status: DC

## 2017-04-15 NOTE — Progress Notes (Signed)
Subjective:    Patient ID: Kathryn Beck, female    DOB: 12-30-1955, 62 y.o.   MRN: [id number]  Fayrene Helper, MD  HPI Trouble swallowing solids and liquids. ALWAYS FEEL S LIKE SOMETHING STUCK IN HER CHEST. NO VOMITING BUT CAN BE NAUSEATED. CAN'T FINISH A WHOLE MEAL. FELT LIKE STRETCHING IN 2017 AND 2018 MADE THINGS BETTER BUT SYMPTOMS DID NOT RESOLVE. NOT REALLY HUNGRY. IT STARTS OFF LIKE PRESSURE/ACHY AND THEN IT HURTS. SYMPTOMS MAY LAST 30-40 MINS AT A TIME AND THEN GOES AWAY. MAY BE TRIGGERED BY MEDS, WATER, OR FOOD.   WENT TO ED FOR BACK AND STOMACH PAIN. SHE GOT FLUIDS AND PAIN MEDS FOR PANCREATITIS. CAN GO TO [place] FOR PANCREAS EVALUATION. NEVER TOBACCO OR ETOH. BMs: GOOD, MOSTLY NO PROBLEMS.STOMACH PAIN: IN FRONT, 2-3 TIMES A DAY PR WEEK, LASTS 3-4 HRS A DAY AND GOES AWAY AND THEN COMES BACK AGAIN AND STAYS, MOSTLY BACK FIRST STARTS TO HURT AND THEN STOMACH PAIN. IT DRAINS HER. SOMETIMES FEELS LIKE SHE LOSES HER BREATH FOR A MINUTE.HEARTBURN: BAD EVERY DAY. LAST MEDICINE DOESN'T HELP. YESETERDAY-BREAKFAST: EGG SANDWICH/BOLOGNA/TOMATO, LUNCH: NOTHING, BAKED CHICKEN, PINTOS. USUALLY DRINK WATER. RARE SODA. COFFEE: 1/2 CUP SOME MORNING. PROTONIX TWICE A DAY BUT DOESN'T DO GOOD.  PT DENIES FEVER, CHILLS, HEMATOCHEZIA, HEMATEMESIS, melena, diarrhea, CHANGE IN BOWEL IN HABITS, constipation, OR problems with sedation.  UNABLE TO WORK SINCE GETTING OUT OF THE HOSPITAL.  Past Medical History:  Diagnosis Date  . Anemia   . Axillary masses    Soft tissue - status post excision  . Back pain   . Chronic kidney disease   . Essential hypertension   . Ischemic heart disease    Abnormal Myoview April 2018 - medical therapy  . Mixed hyperlipidemia   . Obesity   . Sleep apnea    Noncompliant with CPAP  . Type 2 diabetes mellitus, uncontrolled ([place])    Past Surgical History:  Procedure Laterality Date  . ABDOMINAL HYSTERECTOMY    . COLONOSCOPY  2008   Dr. Oneida Alar: normal   .  COLONOSCOPY N/A 12/18/2016   Dr. Oneida Alar: multiple tubular adenomas, internal hemorrhoids. Surveillance in 3 years   . ESOPHAGEAL DILATION N/A 10/13/2015   Procedure: ESOPHAGEAL DILATION;  Surgeon: Rogene Houston, MD;  Location: [facility];  Service: Endoscopy;  Laterality: N/A;  . ESOPHAGOGASTRODUODENOSCOPY N/A 10/13/2015   Dr. Laural Golden: chronic gastritis on path, no H.pylori. Empiric dilation   . ESOPHAGOGASTRODUODENOSCOPY N/A 12/18/2016   Dr. Oneida Alar: mild gastritis. BRAVO study revealed uncontrolled GERD. Dysphagia secondary to uncontrolled reflux  . FOOT SURGERY Bilateral   . LUNG BIOPSY    . MASS EXCISION Right 01/09/2013   Procedure: EXCISION OF NEOPLASM OF RIGHT  AXILLA  AND EXCISION OF NEOPLASM OF LEFT AXILLA;  Surgeon: Jamesetta So, MD;  Location: [facility] ORS;  Service: General;  Laterality: Right;  procedure end @ 08:23  . SAVORY DILATION N/A 12/18/2016   Procedure: SAVORY DILATION;  Surgeon: Danie Binder, MD;  Location: [facility];  Service: Endoscopy;  Laterality: N/A;   Allergies  Allergen Reactions  . Ace Inhibitors Anaphylaxis and Swelling  . Penicillins Itching and Swelling      . Statins Other (See Comments)    elevated LFT's   Current Outpatient Medications  Medication Sig Dispense Refill  . allopurinol (ZYLOPRIM) 300 MG tablet TAKE 1 TABLET (300 MG TOTAL) BY MOUTH DAILY.    Marland Kitchen amLODipine (NORVASC) 10 MG tablet TAKE 1 TABLET BY MOUTH  DAILY    . atorvastatin (LIPITOR) 20 MG tablet Take 1 tablet (20 mg total) by mouth daily.    Marland Kitchen azelastine (ASTELIN) 0.1 % nasal spray Place 2 sprays into both needed for allergies. Use in each nostril as directed)    . calcitRIOL (ROCALTROL) 0.25 MCG capsule One capsule three times weekly ( Monday, Wednesday, Friday)    . ciprofloxacin-dexamethasone (CIPRODEX) OTIC suspension Place 4 drops into both ears 2 (two) times daily.    . cloNIDine (CATAPRES) 0.1 MG tablet TAKE 1 TABLET BY MOUTH  EVERY EVENING AT 9PM FOR  BLOOD PRESSURE    .  dicyclomine (BENTYL) 10 MG capsule 1 po qachs if needed to prevent diarrhea and abdominal cramps    . ezetimibe (ZETIA) 10 MG tablet Take 1 tablet (10 mg total) by mouth daily.    . ferrous sulfate 325 (65 FE) MG tablet Take 1 tablet (325 mg total) by mouth daily with breakfast.    . furosemide (LASIX) 40 MG tablet Take 1 tablet (40 mg total) by mouth daily.    Marland Kitchen gemfibrozil (LOPID) 600 MG tablet Take 1 tablet (600 mg total) by mouth 2 (two) times daily before a meal.    . glucose blood (ACCU-CHEK AVIVA PLUS) test strip USE 4 TIMES DAILY E11.65    . Insulin Detemir (LEVEMIR) 100 UNIT/ML Pen Inject 100 Units into the skin daily at 10 pm.    . Insulin Lispro (HUMALOG KWIKPEN) 200 UNIT/ML SOPN Inject 26-34 Units into the skin 3 (three) times daily before meals.    . Insulin Pen Needle (B-D UF III MINI PEN NEEDLES) 31G X 5 MM MISC USE AS INSTRUCTED THREE  TIMES DAILY    . isosorbide mononitrate (IMDUR) 30 MG 24 hr tablet Take 1 tablet (30 mg total) by mouth daily.    Marland Kitchen labetalol (NORMODYNE) 200 MG tablet Take 2 tablets (400 mg total) by mouth 2 (two) times daily.    . montelukast (SINGULAIR) 10 MG tablet Take 10 mg by mouth at bedtime.    Marland Kitchen olmesartan (BENICAR) 40 MG tablet Take 1 tablet (40 mg total) by mouth daily.    Marland Kitchen Phenyleph-Doxylamine-DM-APAP (ALKA-SELTZER PLS ALLERGY & CGH PO) Take 2 tablets by mouth daily as needed (cold).    . Vitamin D, Ergocalciferol, (DRISDOL) 50000 units CAPS capsule TAKE 1 CAPSULE (50,000 UNITS TOTAL) BY MOUTH EVERY 7 (SEVEN) DAYS. (Patient taking differently: Take 50,000 Units by mouth every 7 (seven) days. Take on Mondays.)    . PROTONIX BID     Review of Systems PER HPI OTHERWISE ALL SYSTEMS ARE NEGATIVE.    Objective:   Physical Exam  Constitutional: She is oriented to person, place, and time. She appears well-developed and well-nourished. No distress.  HENT:  Head: Normocephalic and atraumatic.  Mouth/Throat: Oropharynx is clear and moist. No oropharyngeal  exudate.  Eyes: Pupils are equal, round, and reactive to light. No scleral icterus.  Neck: Normal range of motion. Neck supple.  Cardiovascular: Normal rate, regular rhythm and normal heart sounds.  Pulmonary/Chest: Effort normal and breath sounds normal. No respiratory distress.  Abdominal: Soft. Bowel sounds are normal. She exhibits no distension. There is tenderness. There is no rebound and no guarding.  MILD to moderate tenderness in all 4 quadrants  Musculoskeletal: She exhibits no edema.  Lymphadenopathy:    She has no cervical adenopathy.  Neurological: She is alert and oriented to person, place, and time.  NO  NEW FOCAL DEFICITS  Psychiatric:  FLAT AFFECT, NORMAL MOOD  Vitals reviewed.     Assessment & Plan:

## 2017-04-15 NOTE — Assessment & Plan Note (Signed)
SYMPTOMS CONTROLLED/RESOLVED AND FIRST EPISODE OF MILD UNCOMPLICATED PANCREATITIS. LAST HGA1C 11.5.  CONTINUE YOUR WEIGHT LOSS EFFORTS. TO COMPLETE THE WORKUP FOR YOUR PANCREAS, COMPLETE ENDOSCOPIC ULTRASOUND IN 2-3 WEEK SWITH DR. Paulita Fujita. FOLLOW UP IN 2 MOS.

## 2017-04-15 NOTE — Progress Notes (Signed)
cc'ed to pcp °

## 2017-04-15 NOTE — Assessment & Plan Note (Signed)
SYMPTOMS NOT CONTROLLED DUE TO LIFESTYLE CHOICES. FEELS PROTONIX DOES NOT WORK BUT DEXILANT DID.   CONTINUE YOUR WEIGHT LOSS EFFORTS. AVOID REFLUX TRIGGERS.  HANDOUT GIVEN AND DISCUSSED. PT VOICED HER UNDERSTANDING. FOLLOW A LOW FAT/DIABETIC DIET. MEATS SHOULD BE BAKED, BROILED, OR BOILED. AVOID FRIED FOODS.  HANDOUT GIVEN.. CONTINUE [place] OR PROTONIX. TAKE 30 MINUTES PRIOR TO MEALS TWICE DAILY. FOLLOW UP IN 2 MOS.

## 2017-04-15 NOTE — Progress Notes (Signed)
Patient scheduled.

## 2017-04-15 NOTE — Patient Instructions (Addendum)
CONTINUE YOUR WEIGHT LOSS EFFORTS.  AVOID REFLUX TRIGGERS. SEE INFO BELOW.   FOLLOW A LOW FAT/DIABETIC DIET. MEATS SHOULD BE BAKED, BROILED, OR BOILED. AVOID FRIED FOODS.SEE INFO BELOW.  CONTINUE DEXILANT OR PROTONIX. TAKE 30 MINUTES PRIOR TO MEALS TWICE DAILY.   TO COMPLETE THE WORKUP FOR YOUR PANCREAS, COMPLETE ENDOSCOPIC ULTRASOUND IN 2-3 WEEK SWITH DR. Paulita Fujita.  FOLLOW UP IN 2 MOS.    Lifestyle and home remedies TO CONTROL HEARTBURN/REFLUX You may eliminate or reduce the frequency of heartburn by making the following lifestyle changes:  . Control your weight. Being overweight is a major risk factor for heartburn and GERD. Excess pounds put pressure on your abdomen, pushing up your stomach and causing acid to back up into your esophagus.   . Eat smaller meals. 4 TO 6 MEALS A DAY. This reduces pressure on the lower esophageal sphincter, helping to prevent the valve from opening and acid from washing back into your esophagus.   Dolphus Jenny your belt. Clothes that fit tightly around your waist put pressure on your abdomen and the lower esophageal sphincter.   . Eliminate heartburn triggers. Everyone has specific triggers. Common triggers such as fatty or fried foods, spicy food, tomato sauce, carbonated beverages, alcohol, chocolate, mint, garlic, onion, caffeine and nicotine may make heartburn worse.   Marland Kitchen Avoid stooping or bending. Tying your shoes is OK. Bending over for longer periods to weed your garden isn't, especially soon after eating.   . Don't lie down after a meal. Wait at least three to four hours after eating before going to bed, and don't lie down right after eating.   Alternative medicine . Several home remedies exist for treating GERD, but they provide only temporary relief. They include drinking baking soda (sodium bicarbonate) added to water or drinking other fluids such as baking soda mixed with cream of tartar and water. . Although these liquids create temporary relief  by neutralizing, washing away or buffering acids, eventually they aggravate the situation by adding gas and fluid to your stomach, increasing pressure and causing more acid reflux. Further, adding more sodium to your diet may increase your blood pressure and add stress to your heart, and excessive bicarbonate ingestion can alter the acid-base balance in your body.   Low-Fat Diet BREADS, CEREALS, PASTA, RICE, DRIED PEAS, AND BEANS These products are high in carbohydrates and most are low in fat. Therefore, they can be increased in the diet as substitutes for fatty foods. They too, however, contain calories and should not be eaten in excess. Cereals can be eaten for snacks as well as for breakfast.   FRUITS AND VEGETABLES It is good to eat fruits and vegetables. Besides being sources of fiber, both are rich in vitamins and some minerals. They help you get the daily allowances of these nutrients. Fruits and vegetables can be used for snacks and desserts.  MEATS Limit lean meat, chicken, [place], and fish to no more than 6 ounces per day. Beef, Pork, and Lamb Use lean cuts of beef, pork, and lamb. Lean cuts include:  Extra-lean ground beef.  Arm roast.  Sirloin tip.  Center-cut ham.  Round steak.  Loin chops.  Rump roast.  Tenderloin.  Trim all fat off the outside of meats before cooking. It is not necessary to severely decrease the intake of red meat, but lean choices should be made. Lean meat is rich in protein and contains a highly absorbable form of iron. Premenopausal women, in particular, should avoid reducing lean red  meat because this could increase the risk for low red blood cells (iron-deficiency anemia).  Chicken and [place] These are good sources of protein. The fat of poultry can be reduced by removing the skin and underlying fat layers before cooking. Chicken and [place] can be substituted for lean red meat in the diet. Poultry should not be fried or covered with high-fat  sauces. Fish and Shellfish Fish is a good source of protein. Shellfish contain cholesterol, but they usually are low in saturated fatty acids. The preparation of fish is important. Like chicken and [place], they should not be fried or covered with high-fat sauces. EGGS Egg whites contain no fat or cholesterol. They can be eaten often. Try 1 to 2 egg whites instead of whole eggs in recipes or use egg substitutes that do not contain yolk. MILK AND DAIRY PRODUCTS Use skim or 1% milk instead of 2% or whole milk. Decrease whole milk, natural, and processed cheeses. Use nonfat or low-fat (2%) cottage cheese or low-fat cheeses made from vegetable oils. Choose nonfat or low-fat (1 to 2%) yogurt. Experiment with evaporated skim milk in recipes that call for heavy cream. Substitute low-fat yogurt or low-fat cottage cheese for sour cream in dips and salad dressings. Have at least 2 servings of low-fat dairy products, such as 2 glasses of skim (or 1%) milk each day to help get your daily calcium intake. FATS AND OILS Reduce the total intake of fats, especially saturated fat. Butterfat, lard, and beef fats are high in saturated fat and cholesterol. These should be avoided as much as possible. Vegetable fats do not contain cholesterol, but certain vegetable fats, such as coconut oil, palm oil, and palm kernel oil are very high in saturated fats. These should be limited. These fats are often used in bakery goods, processed foods, popcorn, oils, and nondairy creamers. Vegetable shortenings and some peanut butters contain hydrogenated oils, which are also saturated fats. Read the labels on these foods and check for saturated vegetable oils. Unsaturated vegetable oils and fats do not raise blood cholesterol. However, they should be limited because they are fats and are high in calories. Total fat should still be limited to 30% of your daily caloric intake. Desirable liquid vegetable oils are corn oil, cottonseed oil, olive  oil, canola oil, safflower oil, soybean oil, and sunflower oil. Peanut oil is not as good, but small amounts are acceptable. Buy a heart-healthy tub margarine that has no partially hydrogenated oils in the ingredients. Mayonnaise and salad dressings often are made from unsaturated fats, but they should also be limited because of their high calorie and fat content. Seeds, nuts, peanut butter, olives, and avocados are high in fat, but the fat is mainly the unsaturated type. These foods should be limited mainly to avoid excess calories and fat. OTHER EATING TIPS Snacks  Most sweets should be limited as snacks. They tend to be rich in calories and fats, and their caloric content outweighs their nutritional value. Some good choices in snacks are graham crackers, melba toast, soda crackers, bagels (no egg), English muffins, fruits, and vegetables. These snacks are preferable to snack crackers, [place] fries, TORTILLA CHIPS, and POTATO chips. Popcorn should be air-popped or cooked in small amounts of liquid vegetable oil. Desserts Eat fruit, low-fat yogurt, and fruit ices instead of pastries, cake, and cookies. Sherbet, angel food cake, gelatin dessert, frozen low-fat yogurt, or other frozen products that do not contain saturated fat (pure fruit juice bars, frozen ice pops) are also acceptable.  COOKING METHODS Choose those methods that use little or no fat. They include: Poaching.  Braising.  Steaming.  Grilling.  Baking.  Stir-frying.  Broiling.  Microwaving.  Foods can be cooked in a nonstick pan without added fat, or use a nonfat cooking spray in regular cookware. Limit fried foods and avoid frying in saturated fat. Add moisture to lean meats by using water, broth, cooking wines, and other nonfat or low-fat sauces along with the cooking methods mentioned above. Soups and stews should be chilled after cooking. The fat that forms on top after a few hours in the refrigerator should be skimmed off. When  preparing meals, avoid using excess salt. Salt can contribute to raising blood pressure in some people.  EATING AWAY FROM HOME Order entres, potatoes, and vegetables without sauces or butter. When meat exceeds the size of a deck of cards (3 to 4 ounces), the rest can be taken home for another meal. Choose vegetable or fruit salads and ask for low-calorie salad dressings to be served on the side. Use dressings sparingly. Limit high-fat toppings, such as bacon, crumbled eggs, cheese, sunflower seeds, and olives. Ask for heart-healthy tub margarine instead of butter.

## 2017-04-15 NOTE — Assessment & Plan Note (Addendum)
SYMPTOMS NOT CONTROLLED IN SETTING ON UNCONTROLLED REFLUX. BRAVO SHOWS REFLUX/REGURGITATION AND LAST HGA1C WAS 11.24 Feb 2017.  CONTINUE YOUR WEIGHT LOSS EFFORTS. AVOID REFLUX TRIGGERS.  HANDOUT GIVEN. FOLLOW A LOW FAT/DIABETIC DIET. MEATS SHOULD BE BAKED, BROILED, OR BOILED. AVOID FRIED FOODS.  HANDOUT GIVEN. CONTINUE DEXILANT OR PROTONIX. TAKE 30 MINUTES PRIOR TO MEALS TWICE DAILY. TO COMPLETE THE WORKUP FOR DYSPHAGIA, SEE GI DOCS AT [facility].  FOLLOW UP IN 2 MOS. CONSIDER GES.

## 2017-04-18 ENCOUNTER — Other Ambulatory Visit: Payer: Self-pay | Admitting: Family Medicine

## 2017-04-18 DIAGNOSIS — E782 Mixed hyperlipidemia: Secondary | ICD-10-CM

## 2017-04-27 ENCOUNTER — Encounter: Payer: Self-pay | Admitting: Family Medicine

## 2017-04-27 ENCOUNTER — Ambulatory Visit (INDEPENDENT_AMBULATORY_CARE_PROVIDER_SITE_OTHER): Payer: 59 | Admitting: Family Medicine

## 2017-04-27 ENCOUNTER — Encounter (HOSPITAL_COMMUNITY): Payer: Self-pay | Admitting: General Practice

## 2017-04-27 VITALS — BP 168/84 | HR 89 | Resp 16 | Ht 64.0 in | Wt 200.0 lb

## 2017-04-27 DIAGNOSIS — I10 Essential (primary) hypertension: Secondary | ICD-10-CM | POA: Diagnosis not present

## 2017-04-27 DIAGNOSIS — E782 Mixed hyperlipidemia: Secondary | ICD-10-CM

## 2017-04-27 DIAGNOSIS — Z09 Encounter for follow-up examination after completed treatment for conditions other than malignant neoplasm: Secondary | ICD-10-CM | POA: Diagnosis not present

## 2017-04-27 NOTE — Progress Notes (Signed)
Anesthesia Chart Review:  Pt is a same day work up  Pt is a 62 year old female scheduled for R myringotomy tube placement on 04/28/2017 with Leta Baptist, MD  Providers:  - PCP is Tula Nakayama, MD - Endocrinologist is [name], MD. Last office visit note 03/19/17 documents "long standing uncontrolled diabetes" - GI is Barney Drain, MD - Cardiologist is Rozann Lesches, MD. Last office visit 08/18/16; 6 month f/u recommended but did not happen.  - Nephrologist is Dr. Lowanda Foster  PMH includes:  Ischemic heart disease (based on abnormal stress test 07/06/16: medical therapy planned in light of pt's advanced CKD), HTN, DM, hyperlipidemia, CKD (stage IV), OSA, pancreatitis, GERD. Never smoker. 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  - HbA1c was 11.5 on 03/19/18 - Pt reported during phone call with pre-admission testing RN that her fasting blood glucose is usually 250-300 (RN notified Dr. Deeann Saint office of uncontrolled DM)  CXR 03/24/17: No active cardiopulmonary disease.  EKG 03/24/17: Sinus rhythm. Low voltage, precordial leads. Baseline wander in lead(s) I III aVL aVF  Nuclear stress test 07/06/16:   There was no ST segment deviation noted during stress.  Findings consistent with prior moderate anterior myocardial infarction with moderate peri-infarct ischemia. SSS 10 SRS 4 SDS 6  The left ventricular ejection fraction is normal (55-65%).  This is an intermediate risk study.  Echo 03/04/16:  - Left ventricle: The cavity size was normal. Wall thickness was increased in a pattern of moderate LVH. Systolic function was vigorous. The estimated ejection fraction was in the range of 65% to 70%. Wall motion was normal; there were no regional  wall motion abnormalities. Doppler parameters are consistent with abnormal left ventricular relaxation (grade 1 diastolic dysfunction). LA pressure is indeterminant. - Aortic valve: Valve area (VTI): 2.39 cm^2. Valve area (Vmax): 2.83 cm^2. - Technically adequate study.  Carotid duplex 05/09/15:  - Minor carotid intimal thickening. No significant atherosclerosis. No hemodynamically significant ICA stenosis. Degree of narrowing less than 50% bilaterally  Reviewed case with Dr. Marcie Bal.  Pt with known uncontrolled DM, and suspected CAD based on 07/06/16 stress test results.  Per cardiology notes, cardiac cath/intervention deferred due to advanced CKD (not yet on dialysis).  No CV symptoms documented in pre-admission phone call note.  Pt will need further assessment by the assigned anesthesiologist day of surgery. If no acute CV symptoms and labs acceptable, I anticipate pt can proceed with surgery as scheduled.    Willeen Cass, FNP-BC [facility]/Anesthesiology Phone: [phone] 04/27/2017 4:11 PM

## 2017-04-27 NOTE — Patient Instructions (Signed)
F/u in end May, call if you need me before  Please take morning blood pressure medication  Between 8 am and 9am, the you may also take at 12 md , and twice daily blood pressure medication at 9am and 9pm , also evening bP medication at 9pm  Need to take consistently  Atorvastatin is your cholesterol medication  Furosemide is once daily    Fasting lip[id, cmp and eGFr and cBC 3 days before next visit  All the best with procedure on right ear and with procedure at [facility]

## 2017-04-27 NOTE — Progress Notes (Signed)
PCP - Dr. Tula Nakayama Endocrinologist - Dr. Cruzita Lederer Cardiologist -  Pt states she sees Dr. Domenic Polite or Dr. Johnsie Cancel  EKG -03/24/17 CXR - 03/24/17 Echo - 03/04/16 Stress test - 11/205   Patient states that her fasting glucose is 250-300; A1C 11.5 on 03/19/17.  Myra Gianotti and Willeen Cass made aware of patient's blood sugar.  Also, notified Timea at Dr. Deeann Saint office who stated that she was aware but would also notify Dr. Benjamine Mola.   Patient informed that if blood sugars are that elevated on DOS that she could be canceled.     Patient states that she has recently started seeing Dr. Cruzita Lederer but there have not been any changes to medications.  Patient informed nurse that she takes 100 units of Levemir at bedtime and has sliding scale Humalog and she checks her blood sugar before every meal.    Patient is seeing her PCP today for a follow-up from her recent hospitalization.  Patient complains of a cough with no fever for approximately 1 week.

## 2017-04-28 ENCOUNTER — Other Ambulatory Visit: Payer: Self-pay

## 2017-04-28 ENCOUNTER — Encounter (HOSPITAL_COMMUNITY): Payer: Self-pay | Admitting: Anesthesiology

## 2017-04-28 ENCOUNTER — Ambulatory Visit (HOSPITAL_COMMUNITY): Payer: 59 | Admitting: Emergency Medicine

## 2017-04-28 ENCOUNTER — Encounter (HOSPITAL_COMMUNITY)
Admission: RE | Disposition: A | Discharge status: Home / Self Care | Payer: Self-pay | Source: Ambulatory Visit | Attending: Otolaryngology

## 2017-04-28 ENCOUNTER — Ambulatory Visit (HOSPITAL_COMMUNITY)
Admission: RE | Admit: 2017-04-28 | Discharge: 2017-04-28 | Disposition: A | Discharge status: Home / Self Care | Payer: 59 | Source: Ambulatory Visit | Attending: Otolaryngology | Admitting: Otolaryngology

## 2017-04-28 DIAGNOSIS — Z794 Long term (current) use of insulin: Secondary | ICD-10-CM | POA: Insufficient documentation

## 2017-04-28 DIAGNOSIS — H65493 Other chronic nonsuppurative otitis media, bilateral: Secondary | ICD-10-CM | POA: Diagnosis not present

## 2017-04-28 DIAGNOSIS — E119 Type 2 diabetes mellitus without complications: Secondary | ICD-10-CM | POA: Insufficient documentation

## 2017-04-28 DIAGNOSIS — Z79899 Other long term (current) drug therapy: Secondary | ICD-10-CM | POA: Diagnosis not present

## 2017-04-28 DIAGNOSIS — Z888 Allergy status to other drugs, medicaments and biological substances status: Secondary | ICD-10-CM | POA: Insufficient documentation

## 2017-04-28 DIAGNOSIS — Z880 Allergy status to penicillin: Secondary | ICD-10-CM | POA: Insufficient documentation

## 2017-04-28 DIAGNOSIS — I10 Essential (primary) hypertension: Secondary | ICD-10-CM | POA: Insufficient documentation

## 2017-04-28 DIAGNOSIS — H9011 Conductive hearing loss, unilateral, right ear, with unrestricted hearing on the contralateral side: Secondary | ICD-10-CM | POA: Insufficient documentation

## 2017-04-28 DIAGNOSIS — H6983 Other specified disorders of Eustachian tube, bilateral: Secondary | ICD-10-CM | POA: Insufficient documentation

## 2017-04-28 HISTORY — DX: Gastro-esophageal reflux disease without esophagitis: K21.9

## 2017-04-28 HISTORY — PX: MYRINGOTOMY WITH TUBE PLACEMENT: SHX5663

## 2017-04-28 HISTORY — DX: Unspecified osteoarthritis, unspecified site: M19.90

## 2017-04-28 HISTORY — DX: Acute pancreatitis without necrosis or infection, unspecified: K85.90

## 2017-04-28 LAB — BASIC METABOLIC PANEL
Anion gap: 18 — ABNORMAL HIGH (ref 5–15)
BUN: 42 mg/dL — ABNORMAL HIGH (ref 6–20)
CO2: 14 mmol/L — AB (ref 22–32)
Calcium: 9 mg/dL (ref 8.9–10.3)
Chloride: 112 mmol/L — ABNORMAL HIGH (ref 101–111)
Creatinine, Ser: 2.91 mg/dL — ABNORMAL HIGH (ref 0.44–1.00)
GFR calc non Af Amer: 16 mL/min — ABNORMAL LOW (ref >60)
GFR, EST AFRICAN AMERICAN: 19 mL/min — AB (ref >60)
Glucose, Bld: 138 mg/dL — ABNORMAL HIGH (ref 65–99)
Potassium: 4.1 mmol/L (ref 3.5–5.1)
SODIUM: 144 mmol/L (ref 135–145)

## 2017-04-28 LAB — GLUCOSE, CAPILLARY
Glucose-Capillary: 150 mg/dL — ABNORMAL HIGH (ref 65–99)
Glucose-Capillary: 152 mg/dL — ABNORMAL HIGH (ref 65–99)

## 2017-04-28 LAB — CBC
HEMATOCRIT: 30.1 % — AB (ref 36.0–46.0)
Hemoglobin: 8.9 g/dL — ABNORMAL LOW (ref 12.0–15.0)
MCH: 21.9 pg — ABNORMAL LOW (ref 26.0–34.0)
MCHC: 29.6 g/dL — AB (ref 30.0–36.0)
MCV: 74.1 fL — ABNORMAL LOW (ref 78.0–100.0)
Platelets: UNDETERMINED 10*3/uL (ref 150–400)
RBC: 4.06 MIL/uL (ref 3.87–5.11)
RDW: 17.1 % — ABNORMAL HIGH (ref 11.5–15.5)
WBC: 10 10*3/uL (ref 4.0–10.5)

## 2017-04-28 SURGERY — MYRINGOTOMY WITH TUBE PLACEMENT
Anesthesia: Monitor Anesthesia Care | Site: Ear | Laterality: Bilateral

## 2017-04-28 MED ORDER — CIPROFLOXACIN-DEXAMETHASONE 0.3-0.1 % OT SUSP
OTIC | Status: AC
  Filled 2017-04-28: qty 7.5

## 2017-04-28 MED ORDER — LIDOCAINE HCL (CARDIAC) 20 MG/ML IV SOLN
INTRAVENOUS | Status: DC | PRN
  Administered 2017-04-28: 50 mg via INTRAVENOUS

## 2017-04-28 MED ORDER — CIPROFLOXACIN-DEXAMETHASONE 0.3-0.1 % OT SUSP
OTIC | Status: AC
Start: 2017-04-28 — End: ?
  Filled 2017-04-28: qty 7.5

## 2017-04-28 MED ORDER — SODIUM CHLORIDE 0.9 % IV SOLN
INTRAVENOUS | Status: DC
  Administered 2017-04-28: 10 mL/h via INTRAVENOUS

## 2017-04-28 MED ORDER — FENTANYL CITRATE (PF) 100 MCG/2ML IJ SOLN
INTRAMUSCULAR | Status: DC | PRN
  Administered 2017-04-28 (×2): 50 ug via INTRAVENOUS

## 2017-04-28 MED ORDER — MIDAZOLAM HCL 2 MG/2ML IJ SOLN
INTRAMUSCULAR | Status: AC
  Filled 2017-04-28: qty 2

## 2017-04-28 MED ORDER — FENTANYL CITRATE (PF) 250 MCG/5ML IJ SOLN
INTRAMUSCULAR | Status: AC
  Filled 2017-04-28: qty 5

## 2017-04-28 MED ORDER — CIPROFLOXACIN-DEXAMETHASONE 0.3-0.1 % OT SUSP
OTIC | Status: DC | PRN
  Administered 2017-04-28: 4 [drp] via OTIC

## 2017-04-28 MED ORDER — STERILE WATER FOR IRRIGATION IR SOLN
Status: DC | PRN
  Administered 2017-04-28: 500 mL

## 2017-04-28 MED ORDER — MIDAZOLAM HCL 5 MG/5ML IJ SOLN
INTRAMUSCULAR | Status: DC | PRN
  Administered 2017-04-28: 2 mg via INTRAVENOUS

## 2017-04-28 MED ORDER — LIDOCAINE 2% (20 MG/ML) 5 ML SYRINGE
INTRAMUSCULAR | Status: AC
  Filled 2017-04-28: qty 5

## 2017-04-28 MED ORDER — PROPOFOL 10 MG/ML IV BOLUS
INTRAVENOUS | Status: DC | PRN
  Administered 2017-04-28 (×3): 20 mg via INTRAVENOUS
  Administered 2017-04-28: 10 mg via INTRAVENOUS
  Administered 2017-04-28 (×2): 20 mg via INTRAVENOUS
  Administered 2017-04-28: 10 mg via INTRAVENOUS
  Administered 2017-04-28: 20 mg via INTRAVENOUS

## 2017-04-28 MED ORDER — OXYMETAZOLINE HCL 0.05 % NA SOLN
NASAL | Status: AC
  Filled 2017-04-28: qty 15

## 2017-04-28 SURGICAL SUPPLY — 20 items
BLADE MYRINGOTOMY 6 SPEAR HDL (BLADE) ×2 IMPLANT
BLADE SURG 15 STRL LF DISP TIS (BLADE) IMPLANT
BLADE SURG 15 STRL SS (BLADE)
CANISTER SUCT 3000ML PPV (MISCELLANEOUS) ×2 IMPLANT
COTTONBALL LRG STERILE PKG (GAUZE/BANDAGES/DRESSINGS) ×2 IMPLANT
COVER MAYO STAND STRL (DRAPES) ×2 IMPLANT
DRAPE HALF SHEET 40X57 (DRAPES) IMPLANT
GLOVE ECLIPSE 7.5 STRL STRAW (GLOVE) ×2 IMPLANT
GLOVE SURG SS PI 6.0 STRL IVOR (GLOVE) ×2 IMPLANT
KIT ROOM TURNOVER OR (KITS) ×2 IMPLANT
NEEDLE HYPO 25GX1X1/2 BEV (NEEDLE) IMPLANT
PAD ARMBOARD 7.5X6 YLW CONV (MISCELLANEOUS) ×4 IMPLANT
SYR BULB 3OZ (MISCELLANEOUS) IMPLANT
TOWEL OR 17X24 6PK STRL BLUE (TOWEL DISPOSABLE) ×2 IMPLANT
TUBE CONNECTING 12X1/4 (SUCTIONS) ×2 IMPLANT
TUBE EAR ARMSTRONG FL 1.14X3.5 (OTOLOGIC RELATED) IMPLANT
TUBE EAR SHEEHY BUTTON 1.27 (OTOLOGIC RELATED) IMPLANT
TUBE EAR T MOD 1.32X4.8 BL (OTOLOGIC RELATED) ×4 IMPLANT
TUBE EAR VENT PAPARELLA 1.02MM (OTOLOGIC RELATED) IMPLANT
TUBING EXTENTION W/L.L. (IV SETS) IMPLANT

## 2017-04-28 NOTE — Transfer of Care (Signed)
Immediate Anesthesia Transfer of Care Note  Patient: NONIE LOCHNER  Procedure(s) Performed: BILATERAL MYRINGOTOMY WITH TUBE PLACEMENT (Bilateral Ear)  Patient Location: PACU  Anesthesia Type:MAC  Level of Consciousness: awake and alert   Airway & Oxygen Therapy: Patient Spontanous Breathing and Patient connected to face mask oxygen  Post-op Assessment: Report given to RN and Post -op Vital signs reviewed and stable  Post vital signs: Reviewed and stable  Last Vitals:  Vitals:   04/28/17 0730  BP: (!) 178/78  Pulse: 73  Resp: 18  Temp: (!) 36.4 C  SpO2: 99%    Last Pain:  Vitals:   04/28/17 0730  TempSrc: Oral      Patients Stated Pain Goal: 3 (54/98/26 4158)  Complications: No apparent anesthesia complications

## 2017-04-28 NOTE — Anesthesia Preprocedure Evaluation (Signed)
Anesthesia Evaluation  Patient identified by MRN, date of birth, ID band Patient awake    Reviewed: Allergy & Precautions, H&P , Patient's Chart, lab work & pertinent test results  Airway Mallampati: II  TM Distance: <3 FB Neck ROM: full    Dental no notable dental hx. (+) Dental Advidsory Given   Pulmonary resolved,  No CPAP   Pulmonary exam normal        Cardiovascular hypertension, On Medications + Valvular Problems/Murmurs  Rhythm:regular  EF 70% per ECHO 2017   Neuro/Psych    GI/Hepatic Medicated,  Endo/Other  diabetes, Well Controlled, Type 2  Renal/GU      Musculoskeletal   Abdominal   Peds  Hematology  (+) anemia ,   Anesthesia Other Findings   Reproductive/Obstetrics negative OB ROS                             Anesthesia Physical Anesthesia Plan  ASA: III  Anesthesia Plan: MAC   Post-op Pain Management:    Induction:   PONV Risk Score and Plan: Ondansetron  Airway Management Planned:   Additional Equipment:   Intra-op Plan:   Post-operative Plan:   Informed Consent: I have reviewed the patients History and Physical, chart, labs and discussed the procedure including the risks, benefits and alternatives for the proposed anesthesia with the patient or authorized representative who has indicated his/her understanding and acceptance.   Dental Advisory Given  Plan Discussed with: Anesthesiologist  Anesthesia Plan Comments:         Anesthesia Quick Evaluation

## 2017-04-28 NOTE — Anesthesia Postprocedure Evaluation (Signed)
Anesthesia Post Note  Patient: Kathryn Beck  Procedure(s) Performed: BILATERAL MYRINGOTOMY WITH TUBE PLACEMENT (Bilateral Ear)     Patient location during evaluation: PACU Anesthesia Type: MAC Level of consciousness: awake and alert Pain management: pain level controlled Vital Signs Assessment: post-procedure vital signs reviewed and stable Respiratory status: spontaneous breathing, nonlabored ventilation, respiratory function stable and patient connected to nasal cannula oxygen Cardiovascular status: stable and blood pressure returned to baseline Postop Assessment: no apparent nausea or vomiting Anesthetic complications: no    Last Vitals:  Vitals:   04/28/17 1015 04/28/17 1030  BP: (!) 161/89 (!) 164/99  Pulse: 82 79  Resp: (!) 21 18  Temp:    SpO2: 98% 95%    Last Pain:  Vitals:   04/28/17 1000  TempSrc:   PainSc: 0-No pain                 Emilynn Srinivasan DAVID

## 2017-04-28 NOTE — Discharge Instructions (Addendum)
POSTOPERATIVE INSTRUCTIONS FOR PATIENTS HAVING MYRINGOTOMY AND TUBES  1. Please use the ear drops in each ear with a new tube as instructed. Use the drops as prescribed by your doctor, placing the drops into the outer opening of the ear canal with the head tilted to the opposite side. Place a clean piece of cotton into the ear after using drops. A small amount of blood tinged drainage is not uncommon for several days after the tubes are inserted. 2. Nausea and vomiting may be expected the first 6 hours after surgery. Offer liquids initially. If there is no nausea, small light meals are usually best tolerated the day of surgery. A normal diet may be resumed once nausea has passed. 3. The patient may experience mild ear discomfort the day of surgery, which is usually relieved by Tylenol. 4. A small amount of clear or blood-tinged drainage from the ears may occur a few days after surgery. If this should persists or become thick, green, yellow, or foul smelling, please contact our office at [phone]. 5. If you see clear, green, or yellow drainage from your childs ear during colds, clean the outer ear gently with a soft, damp washcloth. Begin the prescribed ear drops (4 drops, twice a day) for one week, as previously instructed.  The drainage should stop within 48 hours after starting the ear drops. If the drainage continues or becomes yellow or green, please call our office. If your child develops a fever greater than 102 F, or has and persistent bleeding from the ear(s), please call us. 6. Try to avoid getting water in the ears. Swimming is permitted as long as there is no deep diving or swimming under water deeper than 3 feet. If you think water has gotten into the ear(s), either bathing or swimming, place 4 drops of the prescribed ear drops into the ear in question. We do recommend drops after swimming in the ocean, rivers, or lakes. 7. It is important for you to return for your scheduled appointment  so that the status of the tubes can be determined.

## 2017-04-28 NOTE — H&P (Signed)
Cc: Right ear hearing loss, ear pain  HPI: The patient is a 62 year old female who returns today complaining of right ear hearing loss.  She also complains of bilateral otalgia.  She describes her pain as a pressure sensation.  The patient was last seen in 10/2016.  At that time, she was noted to have dense right cerumen impaction.  She was placed on Ciprodex eardrops.  However, she was lost to subsequent followup.  The patient currently denies any otorrhea.  She has a history of right eustachian tube dysfunction and right middle ear effusion. She previously underwent right-sided myringotomy and tube placement. No other ENT, GI, or respiratory issue noted since the last visit.   Exam: General: Communicates without difficulty, well nourished, no acute distress. Head: Normocephalic, no evidence injury, no tenderness, facial buttresses intact without stepoff. Eyes: PERRL, EOMI. No scleral icterus, conjunctivae clear. Neuro: CN II exam reveals vision grossly intact. No nystagmus at any point of gaze. Ears: Bilateral cerumen impaction.  Under the operating microscope, the cerumen is carefully removed with a combination of cerumen currette, alligator forceps, and suction catheters.  After the cerumen is removed, the TMs are noted to be intact. Middle ear effusion is noted on the right side. Nose: External evaluation reveals normal support and skin without lesions. Dorsum is intact. Anterior rhinoscopy reveals healthy pink mucosa over anterior aspect of inferior turbinates and intact septum. No purulence noted. Oral:  Oral cavity and oropharynx are intact, symmetric, without erythema or edema. Mucosa is moist without lesions. Neck: Full range of motion without pain. There is no significant lymphadenopathy. No masses palpable. Thyroid bed within normal limits to palpation. Parotid glands and submandibular glands equal bilaterally without mass. Trachea is midline. Neuro:  CN 2-12 grossly intact. Gait normal.  Vestibular: No nystagmus at any point of gaze.   AUDIOMETRIC TESTING:  I reviewed the audiometric result. The test shows right ear conductive hearing loss. The speech reception threshold is 45dB AD and 25dB AS. The discrimination score is 100% AD and 92% AS. The tympanogram is flat on the right.   Assessment 1.  Bilateral cerumen impaction.  The right tube has extruded, and is completely encased in the cerumen.   2.  After the cerumen disimpaction procedure, both tympanic membranes are noted to be intact.  Middle ear effusion is noted on the right side.  3.  Right ear conductive hearing loss.  The left ear hearing is normal.    Plan  1.  Otomicroscopy with bilateral cerumen disimpaction.  The right ventilating tube is also removed.   2.  The physical exam findings and the hearing test results are reviewed with the patient.  3.  In light of her recurrent middle ear effusion and conductive hearing loss, she will benefit from undergoing revision right myringotomy and tube placement.  The risks, benefits, details of the procedure are reviewed.  4.  The patient would like to proceed with the procedure.

## 2017-04-28 NOTE — Op Note (Signed)
DATE OF PROCEDURE:  04/28/2017                              OPERATIVE REPORT  SURGEON:  Leta Baptist, MD  PREOPERATIVE DIAGNOSES: 1. Right eustachian tube dysfunction. 2. Right chronic otitis media with effusion.  POSTOPERATIVE DIAGNOSES: 1. Bilateral eustachian tube dysfunction. 2. Bilateral chronic otitis media with effusion  PROCEDURE PERFORMED: 1) Bilateral myringotomy and tube placement.          ANESTHESIA:  General facemask anesthesia.  COMPLICATIONS:  None.  ESTIMATED BLOOD LOSS:  Minimal.  INDICATION FOR PROCEDURE:   Kathryn Beck is a 62 y.o. female with a history of eustachian tube dysfunction and right middle ear effusion. She was previously treated with right myringotomy and tube placement. The tube has since extruded. Since the tube extrusion, she was noted to have recurrent right middle ear effusion and conductive hearing loss. The decision was therefore made to proceed with revision right myringotomy and tube placement. According to the patient, she has also noted increasing clogging sensation in her left ear over the past week. Intraoperatively, she was noted to have a moderate amount of left middle ear effusion as well. As a result, bilateral myringotomy and tube placement was performed today. Preoperatively, the risks, benefits, alternatives, and details of the procedure were discussed with the patient.  Questions were invited and answered.  Informed consent was obtained.  DESCRIPTION:  The patient was taken to the operating room and placed supine on the operating table.  General facemask anesthesia was administered by the anesthesiologist.  Under the operating microscope, the right ear canal was cleaned of all cerumen.  The tympanic membrane was noted to be intact but mildly retracted.  A standard myringotomy incision was made at the anterior-inferior quadrant on the tympanic membrane.  A copious amount of serous fluid was suctioned from behind the tympanic membrane. A T tube  was placed, followed by antibiotic eardrops in the ear canal.  The same procedure was repeated on the left side without exception. She was noted to have a moderate amount of serous effusion in the left ear as well. The care of the patient was turned over to the anesthesiologist.  The patient was awakened from anesthesia without difficulty.  The patient was transferred to the recovery room in good condition.  OPERATIVE FINDINGS:  Serous middle ear effusion was noted bilaterally.  SPECIMEN:  None.  FOLLOWUP CARE:  The patient will be placed on Ciprodex eardrops 4 drops each ear b.i.d. for 3 days.  The patient will follow up in my office in approximately 4 weeks.  Alexanderia Gorby WOOI 04/28/2017

## 2017-04-28 NOTE — Anesthesia Procedure Notes (Signed)
Procedure Name: MAC Date/Time: 04/28/2017 9:37 AM Performed by: Lieutenant Diego, CRNA Pre-anesthesia Checklist: Patient identified, Emergency Drugs available, Suction available, Patient being monitored and Timeout performed Patient Re-evaluated:Patient Re-evaluated prior to induction Oxygen Delivery Method: Simple face mask

## 2017-04-29 ENCOUNTER — Telehealth: Payer: Self-pay

## 2017-04-29 ENCOUNTER — Encounter (HOSPITAL_COMMUNITY): Payer: Self-pay | Admitting: Otolaryngology

## 2017-04-29 NOTE — Telephone Encounter (Signed)
Called [facility] to f/u on referral for EUS. They tried to contact pt 04/19/17 and 04/26/17 but she hasn't called them back. Called and spoke to pt. She stated she has been at work. Gave her phone number for [facility].

## 2017-05-04 ENCOUNTER — Telehealth: Payer: Self-pay | Admitting: Gastroenterology

## 2017-05-04 NOTE — Telephone Encounter (Signed)
[facility] called to say that patient has OV with Dr Paulita Fujita on 05/19/2017 at 1015 with Dr Paulita Fujita. She said this was a referral that we sent.

## 2017-05-04 NOTE — Telephone Encounter (Signed)
noted 

## 2017-05-05 NOTE — Assessment & Plan Note (Signed)
Hospital course reviewed at the visit. Hopefully, when she is further evaluated at [facility] she will be able to avoid a recurrence of such a sever case of pancreatitis and have improved intestina health Medications are reviewed and sorted out with the patient at the visit

## 2017-05-05 NOTE — Assessment & Plan Note (Signed)
Hyperlipidemia:Low fat diet discussed and encouraged.   Lipid Panel  Lab Results  Component Value Date   CHOL 234 (H) 02/25/2017   HDL 38 (L) 02/25/2017   LDLCALC NOT CALC 05/19/2016   LDLDIRECT 107 05/19/2016   TRIG 414 (H) 03/24/2017   CHOLHDL 6.2 (H) 02/25/2017    atorvastaitn as prescribed and needs to reduce fat in diet

## 2017-05-05 NOTE — Progress Notes (Signed)
   Kathryn Beck     MRN: [id number]      DOB: 1955-09-08   HPI Kathryn Beck is here for follow up of general health and recent hospitalization from 01/02/to 03/31/2017 for acute pancreatitis. She was to have come in sooner but elected to defer the appt. Has been gradually improving with no complications Has brought in all her  medication bottles and wants to review and remove what is not needed, indeed she has duplicate bottles of some Has f/u of her pancreatic disease at [facility] and procedure on right ear to replace tube in th next several days ROS Denies recent fever or chills. Denies sinus pressure, nasal congestion, ear pain or sore throat. Denies chest congestion, productive cough or wheezing. Denies chest pains, palpitations and leg swelling .   Denies dysuria, frequency, hesitancy or incontinence. Denies joint pain, swelling and limitation in mobility. Denies c/o Denies depression and  anxiety  Because of health problems, not suicidal or homicidal, interested in therapy when she has more time Denies skin break down or rash.   PE  BP (!) 168/84   Pulse 89   Resp 16   Ht 5\' 4"  (1.626 m)   Wt 200 lb (90.7 kg)   SpO2 98%   BMI 34.33 kg/m   Patient alert and oriented and in no cardiopulmonary distress.  HEENT: No facial asymmetry, EOMI,   oropharynx pink and moist.  Neck supple no JVD, no mass.  Chest: Clear to auscultation bilaterally.  CVS: S1, S2 no murmurs, no S3.Regular rate.  ABD: Soft , obese, no localized tenderness, or guarding, no rebound, normal bowel spounds Ext: No edema  MS: Adequate ROM spine, shoulders, hips and knees.  Skin: Intact, no ulcerations or rash noted.  Psych: Good eye contact, normal affect. Memory intact not anxious or depressed appearing.  CNS: CN 2-12 intact, power,  normal throughout.no focal deficits noted.   Assessment & Plan  Malignant hypertension Pt on multiple meds and compliance is a challenge. Reviewed a schedule as  to when she should take the meds and laid them out for her Uncontrolled and currently not at goal  DASH diet and commitment to daily physical activity for a minimum of 30 minutes discussed and encouraged, as a part of hypertension management. The importance of attaining a healthy weight is also discussed.  BP/Weight 04/28/2017 04/27/2017 04/15/2017 03/31/2017 03/24/2017 03/19/2017 18/29/9371  Systolic BP 696 789 381 017 - 510 258  Diastolic BP 84 84 84 70 - 90 82  Wt. (Lbs) 200 200 203.6 - 209.44 208 206  BMI 34.33 34.33 34.95 - 35.95 35.7 35.36        Mixed hyperlipidemia Hyperlipidemia:Low fat diet discussed and encouraged.   Lipid Panel  Lab Results  Component Value Date   CHOL 234 (H) 02/25/2017   HDL 38 (L) 02/25/2017   LDLCALC NOT CALC 05/19/2016   LDLDIRECT 107 05/19/2016   TRIG 414 (H) 03/24/2017   CHOLHDL 6.2 (H) 02/25/2017    atorvastaitn as prescribed and needs to reduce fat in diet   Hospital discharge follow-up Hospital course reviewed at the visit. Hopefully, when she is further evaluated at [facility] she will be able to avoid a recurrence of such a sever case of pancreatitis and have improved intestina health Medications are reviewed and sorted out with the patient at the visit

## 2017-05-05 NOTE — Assessment & Plan Note (Addendum)
Pt on multiple meds and compliance is a challenge. Reviewed a schedule as to when she should take the meds and laid them out for her Uncontrolled and currently not at goal  DASH diet and commitment to daily physical activity for a minimum of 30 minutes discussed and encouraged, as a part of hypertension management. The importance of attaining a healthy weight is also discussed.  BP/Weight 04/28/2017 04/27/2017 04/15/2017 03/31/2017 03/24/2017 03/19/2017 41/14/6431  Systolic BP 427 670 110 034 - 961 164  Diastolic BP 84 84 84 70 - 90 82  Wt. (Lbs) 200 200 203.6 - 209.44 208 206  BMI 34.33 34.33 34.95 - 35.95 35.7 35.36

## 2017-05-19 ENCOUNTER — Other Ambulatory Visit: Payer: Self-pay | Admitting: Family Medicine

## 2017-05-19 ENCOUNTER — Other Ambulatory Visit: Payer: Self-pay | Admitting: Gastroenterology

## 2017-05-19 DIAGNOSIS — K861 Other chronic pancreatitis: Secondary | ICD-10-CM

## 2017-05-28 ENCOUNTER — Ambulatory Visit
Admission: RE | Admit: 2017-05-28 | Discharge: 2017-05-28 | Disposition: A | Discharge status: Home / Self Care | Payer: 59 | Source: Ambulatory Visit | Attending: Gastroenterology | Admitting: Gastroenterology

## 2017-05-28 DIAGNOSIS — K861 Other chronic pancreatitis: Secondary | ICD-10-CM

## 2017-05-31 ENCOUNTER — Other Ambulatory Visit: Payer: Self-pay

## 2017-05-31 ENCOUNTER — Ambulatory Visit (INDEPENDENT_AMBULATORY_CARE_PROVIDER_SITE_OTHER): Payer: 59 | Admitting: Otolaryngology

## 2017-05-31 ENCOUNTER — Telehealth: Payer: Self-pay

## 2017-05-31 DIAGNOSIS — Z794 Long term (current) use of insulin: Principal | ICD-10-CM

## 2017-05-31 DIAGNOSIS — E1165 Type 2 diabetes mellitus with hyperglycemia: Principal | ICD-10-CM

## 2017-05-31 DIAGNOSIS — H903 Sensorineural hearing loss, bilateral: Secondary | ICD-10-CM

## 2017-05-31 DIAGNOSIS — H6983 Other specified disorders of Eustachian tube, bilateral: Secondary | ICD-10-CM

## 2017-05-31 DIAGNOSIS — E1121 Type 2 diabetes mellitus with diabetic nephropathy: Secondary | ICD-10-CM

## 2017-05-31 DIAGNOSIS — H7203 Central perforation of tympanic membrane, bilateral: Secondary | ICD-10-CM

## 2017-05-31 DIAGNOSIS — E782 Mixed hyperlipidemia: Secondary | ICD-10-CM

## 2017-05-31 DIAGNOSIS — IMO0002 Reserved for concepts with insufficient information to code with codable children: Secondary | ICD-10-CM

## 2017-05-31 MED ORDER — LABETALOL HCL 200 MG PO TABS: 400.0000 mg | ORAL_TABLET | Freq: Two times a day (BID) | ORAL | 5 refills | Status: DC

## 2017-05-31 MED ORDER — OLMESARTAN MEDOXOMIL 40 MG PO TABS: 40.0000 mg | ORAL_TABLET | Freq: Every day | ORAL | 5 refills | Status: DC

## 2017-05-31 MED ORDER — ISOSORBIDE MONONITRATE ER 30 MG PO TB24: 30.0000 mg | ORAL_TABLET | Freq: Every day | ORAL | 5 refills | Status: DC

## 2017-05-31 MED ORDER — AMLODIPINE BESYLATE 10 MG PO TABS: 10.0000 mg | ORAL_TABLET | Freq: Every day | ORAL | 5 refills | Status: DC

## 2017-05-31 MED ORDER — VITAMIN D (ERGOCALCIFEROL) 1.25 MG (50000 UNIT) PO CAPS: 50000.0000 [IU] | ORAL_CAPSULE | ORAL | 5 refills | Status: DC

## 2017-05-31 MED ORDER — CLONIDINE HCL 0.1 MG PO TABS: ORAL_TABLET | ORAL | 5 refills | Status: DC

## 2017-05-31 MED ORDER — ATORVASTATIN CALCIUM 20 MG PO TABS: 20.0000 mg | ORAL_TABLET | Freq: Every day | ORAL | 5 refills | Status: DC

## 2017-05-31 MED ORDER — EZETIMIBE 10 MG PO TABS: 10.0000 mg | ORAL_TABLET | Freq: Every day | ORAL | 5 refills | Status: DC

## 2017-05-31 MED ORDER — AZELASTINE HCL 0.1 % NA SOLN: 2.0000 | Freq: Two times a day (BID) | NASAL | 5 refills | Status: DC

## 2017-05-31 NOTE — Telephone Encounter (Signed)
Requesting a referral to podiatry for foot exam

## 2017-05-31 NOTE — Telephone Encounter (Signed)
pls enter referal I will sign to whoever she wants and whoever takes hger ins , she is diabetic,  thanks

## 2017-06-01 NOTE — Telephone Encounter (Signed)
Referral entered  

## 2017-06-02 ENCOUNTER — Other Ambulatory Visit: Payer: Self-pay | Admitting: Family Medicine

## 2017-06-08 ENCOUNTER — Telehealth: Payer: Self-pay | Admitting: Family Medicine

## 2017-06-08 DIAGNOSIS — E782 Mixed hyperlipidemia: Secondary | ICD-10-CM

## 2017-06-08 DIAGNOSIS — I10 Essential (primary) hypertension: Secondary | ICD-10-CM

## 2017-06-08 NOTE — Telephone Encounter (Signed)
Please call Adam with [facility] regarding 2057-01-01 --[phone]

## 2017-06-09 ENCOUNTER — Other Ambulatory Visit: Payer: Self-pay | Admitting: Family Medicine

## 2017-06-09 ENCOUNTER — Encounter: Payer: Self-pay | Admitting: Gastroenterology

## 2017-06-09 ENCOUNTER — Ambulatory Visit (INDEPENDENT_AMBULATORY_CARE_PROVIDER_SITE_OTHER): Payer: 59 | Admitting: Gastroenterology

## 2017-06-09 ENCOUNTER — Other Ambulatory Visit: Payer: Self-pay

## 2017-06-09 DIAGNOSIS — K219 Gastro-esophageal reflux disease without esophagitis: Secondary | ICD-10-CM

## 2017-06-09 DIAGNOSIS — R1310 Dysphagia, unspecified: Secondary | ICD-10-CM | POA: Diagnosis not present

## 2017-06-09 DIAGNOSIS — K581 Irritable bowel syndrome with constipation: Secondary | ICD-10-CM | POA: Diagnosis not present

## 2017-06-09 DIAGNOSIS — R1319 Other dysphagia: Secondary | ICD-10-CM

## 2017-06-09 MED ORDER — HYDROCODONE-ACETAMINOPHEN 5-325 MG PO TABS: ORAL_TABLET | ORAL | 0 refills | Status: DC

## 2017-06-09 MED ORDER — FUROSEMIDE 40 MG PO TABS: 40.0000 mg | ORAL_TABLET | Freq: Every day | ORAL | 3 refills | Status: DC

## 2017-06-09 MED ORDER — GEMFIBROZIL 600 MG PO TABS: 600.0000 mg | ORAL_TABLET | Freq: Two times a day (BID) | ORAL | 3 refills | Status: DC

## 2017-06-09 MED ORDER — LIDOCAINE VISCOUS 2 % MT SOLN: OROMUCOSAL | 1 refills | Status: DC

## 2017-06-09 NOTE — Telephone Encounter (Signed)
I refilled and she will need   Fasting lipid, cmp and eGFR 1 week before her appt with me in July  since her lipids are very high she needs the med. Pls call the pharmacist tomorrow and refill, I tried to tel;l the pt , her telephone not available

## 2017-06-09 NOTE — Assessment & Plan Note (Signed)
WITH CONSTIPATION. SYMPTOMS FAIRLY WELL CONTROLLED.  DRINK WATER TO KEEP YOUR URINE LIGHT YELLOW. EAT FIBER USE LINZESS TO AVOID CONSTIPATION. FOLLOW UP IN 6 MOS.

## 2017-06-09 NOTE — Assessment & Plan Note (Signed)
SYMPTOMS IMPROVED. EVALUATION PENDING AT [facility].  CONTINUE WEIGHT LOSS EFFORTS. DRINK WATER TO KEEP YOUR URINE LIGHT YELLOW. FOR ENERGY, TRY HOT LEMON WATER WITH TWO TSP BRAGGS VINEGAR 2 TO 3 TIMES A DAY. AVOID REFLUX RIGGERS. IF YOU DO NOT AVOID TRIGGERS YOU WILL THROW UP.  HANDOUT GIVEN. IF NEEDED, USE VISCOUS LIDOCAINE 2 TSP TO MANAGE FLARES OF HEARTBURN, CHEST PAIN, OR UPPER ABDOMINAL PAIN. YOU CAN RE[EAT A DOSE EVERY 4 HOURS. IT WILL NUMB YOUR ESOPHAGUS AND STOMACH. I CAN GIVE YOU A ONE TIME PRESCRIPTION FOR PAIN MEDS.  DO NOT TAKE Tylenol and VICODIN AT THE SAME TIME. IT WILL KILL YOUR LIVER. USE ONE OR THE OTHER AND MAKE SURE DOSES ARE 6 HOURS APART.  COMPLETE YOUR EVALUATION AT [facility]. FOLLOW UP IN 6 MOS.

## 2017-06-09 NOTE — Progress Notes (Signed)
cc'ed to pcp °

## 2017-06-09 NOTE — Patient Instructions (Addendum)
   FOR ENERGY, TRY HOT LEMON WATER WITH TWO TSP BRAGGS VINEGAR 2 TO 3 TIMES A DAY. OTHERWISE DRINK WATER TO KEEP YOUR URINE LIGHT YELLOW.   CONTINUE YOUR WEIGHT LOSS EFFORTS.  WHILE I DO NOT WANT TO ALARM YOU, YOUR BODY MASS INDEX IS OVER 30 WHICH MEANS YOU ARE OBESE. OBESITY IS ASSOCIATED WITH AN INCREASED RISK FOR UNCONTROLLED REFLUX AND CIRRHOSIS. A WEIGHT OF 170 LBS   WILL GET YOUR BODY MASS INDEX(BMI) UNDER 30.  AVOID REFLUX TRIGGERS. IF YOU DO NOT AVOID TRIGGERS YOU WILL THROW UP. SEE INFO BELOW.  IF NEEDED, USE VISCOUS LIDOCAINE 2 TSP TO MANAGE FLARES OF HEARTBURN, CHEST PAIN, OR UPPER ABDOMINAL PAIN. YOU CAN RE[EAT A DOSE EVERY 4 HOURS. IT WILL NUMB YOUR ESOPHAGUS AND STOMACH.  I CAN GIVE YOU A ONE TIME PRESCRIPTION FOR PAIN MEDS.  DO NOT TAKE Tylenol and VICODIN AT THE SAME TIME. IT WILL KILL YOUR LIVER. USE ONE OR THE OTHER AND MAKE SURE DOSES ARE 6 HOURS APART.    USE LINZESS TO AVOID CONSTIPATION.  COMPLETE YOUR EVALUATION AT [facility].   FOLLOW UP IN 6 MOS.       Lifestyle and home remedies TO MANAGE REFLUX/CHEST PAIN  You may eliminate or reduce the frequency of heartburn by making the following lifestyle changes:  . Control your weight. Being overweight is a major risk factor for heartburn and GERD. Excess pounds put pressure on your abdomen, pushing up your stomach and causing acid to back up into your esophagus.   . Eat smaller meals. 4 TO 6 MEALS A DAY. This reduces pressure on the lower esophageal sphincter, helping to prevent the valve from opening and acid from washing back into your esophagus.   Dolphus Jenny your belt. Clothes that fit tightly around your waist put pressure on your abdomen and the lower esophageal sphincter.   . Eliminate heartburn triggers. Everyone has specific triggers. Common triggers such as fatty or fried foods, spicy food, tomato sauce, carbonated beverages, alcohol, chocolate, mint, garlic, onion, caffeine and nicotine may make heartburn  worse.   Marland Kitchen Avoid stooping or bending. Tying your shoes is OK. Bending over for longer periods to weed your garden isn't, especially soon after eating.   . Don't lie down after a meal. Wait at least three to four hours after eating before going to bed, and don't lie down right after eating.   Marland Kitchen PUT THE HEAD OF YOUR BED ON 6 INCH BLOCKS.   Alternative medicine . Several home remedies exist for treating GERD, but they provide only temporary relief. They include drinking baking soda (sodium bicarbonate) added to water or drinking other fluids such as baking soda mixed with cream of tartar and water.  . Although these liquids create temporary relief by neutralizing, washing away or buffering acids, eventually they aggravate the situation by adding gas and fluid to your stomach, increasing pressure and causing more acid reflux. Further, adding more sodium to your diet may increase your blood pressure and add stress to your heart, and excessive bicarbonate ingestion can alter the acid-base balance in your body.

## 2017-06-09 NOTE — Assessment & Plan Note (Signed)
SYMPTOMS NOT IDEALLY CONTROLLED BUT CLINICALLY IMPROVED. REMAINS SYMPTOMATIC LIKELY DUE TO DIETARY NON-ADHERENCE. WEIGHT DOWN 10 LBS IN 2 YRS BUT BMI > 30.  CONTINUE WEIGHT LOSS EFFORTS. DRINK WATER TO KEEP YOUR URINE LIGHT YELLOW. FOR ENERGY, TRY HOT LEMON WATER WITH TWO TSP BRAGGS VINEGAR 2 TO 3 TIMES A DAY. AVOID REFLUX RIGGERS. IF YOU DO NOT AVOID TRIGGERS YOU WILL THROW UP.  HANDOUT GIVEN. IF NEEDED, USE VISCOUS LIDOCAINE 2 TSP TO MANAGE FLARES OF HEARTBURN, CHEST PAIN, OR UPPER ABDOMINAL PAIN. YOU CAN RE[EAT A DOSE EVERY 4 HOURS. IT WILL NUMB YOUR ESOPHAGUS AND STOMACH. I CAN GIVE YOU A ONE TIME PRESCRIPTION FOR PAIN MEDS.  DO NOT TAKE Tylenol and VICODIN AT THE SAME TIME. IT WILL KILL YOUR LIVER. USE ONE OR THE OTHER AND MAKE SURE DOSES ARE 6 HOURS APART.  USE LINZESS TO AVOID CONSTIPATION. COMPLETE YOUR EVALUATION AT [facility].  FOLLOW UP IN 6 MOS.    Marland Kitchen

## 2017-06-09 NOTE — Progress Notes (Signed)
ON RECALL  °

## 2017-06-09 NOTE — Telephone Encounter (Signed)
Pharmacy wants to know if you want to refill her lopid 600mg  rx. It was filled by another dr last year but he is no longer at the officer the pharmacy contacted. Please advise if you want to refill

## 2017-06-09 NOTE — Progress Notes (Signed)
Subjective:    Patient ID: Kathryn Beck, female    DOB: 06-20-1955, 62 y.o.   MRN: [id number]  Fayrene Helper, MD   HPI Monday FIRST TIME THREW UP SINCE BEEN HURTING. NAUSEA A LOT. HAPPENED TWICE(NO BLOOD). STOMACH A LITTLE BETTER. HAPPENED AFTER EATING BREAKFAST. ATE A BEEF HOT DOG PRIOR TO EPISODE. COFFEE: DECAF. STAYS COLD. BMS: EVERY DAY. NO PAIN IN [place] TODAY. LAST PAIN YESTERDAY. RIGHT ACROSS THE MIDDLE. HAS ANOTHER ENDOSCOPY AT THE END OF April. CAN HAVE HEARTBURN AFTER SHE EATS. TAKING PROTONIX  ONCE A DAY BUT GOING TO TWICE DAILY. SINCE 2017 WEIGHT DOWN FROM 209 LBS TO 199 LBS. REQUESTING PAIN MEDS. MAY HAVE CHEST PAIN BUT THINKS IT'S FROM ACID REFLUX.   PT DENIES FEVER, CHILLS, HEMATOCHEZIA, HEMATEMESIS, melena, diarrhea,  SHORTNESS OF BREATH, CHANGE IN BOWEL IN HABITS, constipation, problems swallowing, problems with sedation, OR heartburn or indigestion.  Past Medical History:  Diagnosis Date  . Acid reflux   . Anemia   . Arthritis   . Axillary masses    Soft tissue - status post excision  . Back pain   . Chronic kidney disease   . Essential hypertension   . Ischemic heart disease    Abnormal Myoview April 2018 - medical therapy  . Mixed hyperlipidemia   . Obesity   . Pancreatitis   . Sleep apnea    Noncompliant with CPAP  . Type 2 diabetes mellitus, uncontrolled ([place])    Past Surgical History:  Procedure Laterality Date  . ABDOMINAL HYSTERECTOMY    . COLONOSCOPY  2008   Dr. Oneida Alar: normal   . COLONOSCOPY N/A 12/18/2016   Dr. Oneida Alar: multiple tubular adenomas, internal hemorrhoids. Surveillance in 3 years   . ESOPHAGEAL DILATION N/A 10/13/2015   Procedure: ESOPHAGEAL DILATION;  Surgeon: Rogene Houston, MD;  Location: [facility];  Service: Endoscopy;  Laterality: N/A;  . ESOPHAGOGASTRODUODENOSCOPY N/A 10/13/2015   Dr. Laural Golden: chronic gastritis on path, no H.pylori. Empiric dilation   . ESOPHAGOGASTRODUODENOSCOPY N/A 12/18/2016   Dr. Oneida Alar: mild  gastritis. BRAVO study revealed uncontrolled GERD. Dysphagia secondary to uncontrolled reflux  . FOOT SURGERY Bilateral   . LUNG BIOPSY    . MASS EXCISION Right 01/09/2013   Procedure: EXCISION OF NEOPLASM OF RIGHT  AXILLA  AND EXCISION OF NEOPLASM OF LEFT AXILLA;  Surgeon: Jamesetta So, MD;  Location: [facility];  Service: General;  Laterality: Right;  procedure end @ 08:23  . MYRINGOTOMY WITH TUBE PLACEMENT Bilateral 04/28/2017   Procedure: BILATERAL MYRINGOTOMY WITH TUBE PLACEMENT;  Surgeon: Leta Baptist, MD;  Location: [facility];  Service: ENT;  Laterality: Bilateral;  . SAVORY DILATION N/A 12/18/2016   Procedure: SAVORY DILATION;  Surgeon: Danie Binder, MD;  Location: [facility];  Service: Endoscopy;  Laterality: N/A;   Allergies  Allergen Reactions  . Ace Inhibitors Anaphylaxis and Swelling  . Penicillins Itching, Swelling and Other (See Comments)    Has patient had a PCN reaction causing immediate rash, facial/tongue/throat swelling, SOB or lightheadedness with hypotension: Yes Has patient had a PCN reaction causing severe rash involving mucus membranes or skin necrosis: No Has patient had a PCN reaction that required hospitalization No Has patient had a PCN reaction occurring within the last 10 years: No If all of the above answers are "NO", then may proceed with Cephalosporin use.   . Statins Other (See Comments)    elevated LFT's    Current Outpatient Medications  Medication Sig    .  acetaminophen (TYLENOL) 500 MG tablet Take 1,000 mg by mouth every 6 (six) hours as needed for moderate pain or headache.    . allopurinol (ZYLOPRIM) 300 MG tablet TAKE 1 TABLET (300 MG TOTAL) BY MOUTH DAILY. (Patient not taking: Reported on 04/27/2017)    . amLODipine (NORVASC) 10 MG tablet Take 1 tablet (10 mg total) by mouth daily.    . Artificial Tear Ointment (DRY EYES OP) Place 2 drops into both eyes daily as needed (for dry eyes).    Marland Kitchen atorvastatin (LIPITOR) 20 MG tablet Take 1 tablet (20 mg total)  by mouth daily.    Marland Kitchen azelastine (ASTELIN) 0.1 % nasal spray Place 2 sprays into both nostrils 2 (two) times daily. Use in each nostril as directed    . calcitRIOL (ROCALTROL) 0.25 MCG capsule Take 0.25 mcg by mouth every Monday, Wednesday, and Friday.     . Cholecalciferol (VITAMIN D3 PO) Take 1 capsule by mouth daily.    . cloNIDine (CATAPRES) 0.1 MG tablet TAKE 1 TABLET BY MOUTH  EVERY EVENING AT 9PM FOR  BLOOD PRESSURE    . cyclobenzaprine (FLEXERIL) 10 MG tablet Take 10 mg by mouth daily as needed for muscle spasms.    .      . PROTONIX DAILY    . ezetimibe (ZETIA) 10 MG tablet Take 1 tablet (10 mg total) by mouth daily.    . ferrous sulfate 325 (65 FE) MG tablet Take 1 tablet (325 mg total) by mouth daily with breakfast.    . furosemide (LASIX) 40 MG tablet Take 1 tablet (40 mg total) by mouth daily.    Marland Kitchen gabapentin (NEURONTIN) 100 MG capsule Take 100 mg by mouth 3 (three) times daily.    Marland Kitchen gemfibrozil (LOPID) 600 MG tablet Take 1 tablet (600 mg total) by mouth 2 (two) times daily before a meal.    . hydrOXYzine (ATARAX/VISTARIL) 25 MG tablet Take 25 mg by mouth 3 (three) times daily.    . Insulin Detemir (LEVEMIR) 100 UNIT/ML Pen Inject 100 Units into the skin daily at 10 pm.    . Insulin Lispro (HUMALOG KWIKPEN) 200 UNIT/ML SOPN Inject 26-34 Units into the skin 3 (three) times daily before meals. (Patient taking differently: Inject 8-28 Units into the skin 3 (three) times daily before meals. Per sliding scale)    . isosorbide mononitrate (IMDUR) 30 MG 24 hr tablet Take 1 tablet (30 mg total) by mouth daily.    Marland Kitchen labetalol (NORMODYNE) 200 MG tablet Take 2 tablets (400 mg total) by mouth 2 (two) times daily.    Marland Kitchen linaclotide (LINZESS) 145 MCG CAPS capsule Take 145 mcg by mouth daily before breakfast. SAMPLES   . montelukast (SINGULAIR) 10 MG tablet Take 10 mg by mouth at bedtime.    Marland Kitchen olmesartan (BENICAR) 40 MG tablet Take 1 tablet (40 mg total) by mouth daily.    .      .  Phenyleph-Doxylamine-DM-APAP (ALKA-SELTZER PLS ALLERGY & [facility] PO) Take 2 tablets by mouth daily as needed (for cold).     . rosuvastatin (CRESTOR) 5 MG tablet Take 5 mg by mouth daily.    Marland Kitchen spironolactone (ALDACTONE) 25 MG tablet Take 25 mg by mouth 2 (two) times daily.    Marland Kitchen triamterene-hydrochlorothiazide (MAXZIDE) 75-50 MG tablet Take 1 tablet by mouth daily.    . valsartan (DIOVAN) 320 MG tablet Take 320 mg by mouth daily.    . Vitamin D, Ergocalciferol, (DRISDOL) 50000 units CAPS capsule Take 1 capsule (50,000  Units total) by mouth every 7 (seven) days.    . Vitamin D, Ergocalciferol, (DRISDOL) 50000 units CAPS capsule TAKE 1 CAPSULE (50,000 UNITS TOTAL) BY MOUTH EVERY 7 (SEVEN) DAYS.     Review of Systems PER HPI OTHERWISE ALL SYSTEMS ARE NEGATIVE.    Objective:   Physical Exam  Constitutional: She is oriented to person, place, and time. She appears well-developed and well-nourished. No distress.  HENT:  Head: Normocephalic and atraumatic.  Mouth/Throat: Oropharynx is clear and moist. No oropharyngeal exudate.  Eyes: Pupils are equal, round, and reactive to light. No scleral icterus.  Neck: Normal range of motion. Neck supple.  Cardiovascular: Normal rate, regular rhythm and normal heart sounds.  Pulmonary/Chest: Effort normal and breath sounds normal. No respiratory distress.  Abdominal: Soft. Bowel sounds are normal. She exhibits no distension. There is tenderness. There is no rebound and no guarding.  MILD TTP x4  Musculoskeletal: She exhibits no edema.  Lymphadenopathy:    She has no cervical adenopathy.  Neurological: She is alert and oriented to person, place, and time.  NO  NEW FOCAL DEFICITS  Psychiatric:  FLAT AFFECT, NL MOOD  Vitals reviewed.     Assessment & Plan:

## 2017-06-10 ENCOUNTER — Ambulatory Visit: Payer: PRIVATE HEALTH INSURANCE | Admitting: Psychiatry

## 2017-06-15 ENCOUNTER — Telehealth: Payer: Self-pay | Admitting: Family Medicine

## 2017-06-15 NOTE — Telephone Encounter (Signed)
Message sent to pharmacy to contact cardiology

## 2017-06-15 NOTE — Addendum Note (Signed)
Addended by: Merceda Elks on: 06/15/2017 03:44 PM   Modules accepted: Orders

## 2017-06-15 NOTE — Telephone Encounter (Signed)
Please have pharmacist contact the original pre scriber , cardiology Practice , even if the prescribe is not there, for refill on gemfibrozil since they are questioning safety. This pt has severe hyperlipidemia

## 2017-06-17 ENCOUNTER — Other Ambulatory Visit: Payer: Self-pay

## 2017-06-17 ENCOUNTER — Telehealth: Payer: Self-pay

## 2017-06-17 NOTE — Telephone Encounter (Signed)
I last saw Kathryn Beck in May 2018.  At that time she was only on Crestor and Zetia.  I did mention the possibility of using omega-3 supplements, but at no point did I order gemfibrozil.  I am not going to make recommendations on a medication that I have not ordered.  It would seem to me that Dr. Moshe Cipro should be the one to address this.

## 2017-06-17 NOTE — Telephone Encounter (Signed)
Adam from [facility] called to ask if the patient should continue gemfibrozil 600 mg BID. Dr. Moshe Cipro originally ordered this medication, but when he called to ask her she deferred the question to her cardiologist since she is also taking atorvastatin 20 mg daily. Please advise as the pharmacist is trying to get her medications in the bubble packaging.

## 2017-06-17 NOTE — Telephone Encounter (Signed)
Noted, we are getting the lipid panel and cmp and will address this when the pt is next seen at her visit

## 2017-06-17 NOTE — Telephone Encounter (Signed)
Called Adam, advised him to defer medication questions to Dr. Moshe Cipro.

## 2017-06-18 ENCOUNTER — Encounter: Payer: Self-pay | Admitting: Internal Medicine

## 2017-06-18 ENCOUNTER — Ambulatory Visit (INDEPENDENT_AMBULATORY_CARE_PROVIDER_SITE_OTHER): Payer: 59 | Admitting: Internal Medicine

## 2017-06-18 VITALS — BP 180/100 | HR 105 | Ht 64.0 in | Wt 197.6 lb

## 2017-06-18 DIAGNOSIS — E1121 Type 2 diabetes mellitus with diabetic nephropathy: Secondary | ICD-10-CM

## 2017-06-18 DIAGNOSIS — E782 Mixed hyperlipidemia: Secondary | ICD-10-CM | POA: Diagnosis not present

## 2017-06-18 DIAGNOSIS — Z794 Long term (current) use of insulin: Secondary | ICD-10-CM

## 2017-06-18 DIAGNOSIS — E1165 Type 2 diabetes mellitus with hyperglycemia: Secondary | ICD-10-CM | POA: Diagnosis not present

## 2017-06-18 DIAGNOSIS — IMO0002 Reserved for concepts with insufficient information to code with codable children: Secondary | ICD-10-CM

## 2017-06-18 LAB — POCT GLYCOSYLATED HEMOGLOBIN (HGB A1C): Hemoglobin A1C: 11.2

## 2017-06-18 MED ORDER — INSULIN GLARGINE 300 UNIT/ML ~~LOC~~ SOPN: 100.0000 [IU] | PEN_INJECTOR | Freq: Every day | SUBCUTANEOUS | 11 refills | Status: DC

## 2017-06-18 MED ORDER — INSULIN LISPRO 200 UNIT/ML ~~LOC~~ SOPN: 32.0000 [IU] | PEN_INJECTOR | Freq: Three times a day (TID) | SUBCUTANEOUS | 3 refills | Status: DC

## 2017-06-18 NOTE — Patient Instructions (Addendum)
Please continue: - Levemir 50 units x 2 injections at bedtime But try to use instead, Toujeo 50 units x2 injections  Please increase: - Humalog 3x a day 15 min before a meal: - 32 units before a smaller meal - 36 units before a larger meal If you do not eat, you may need to inject ~10-15 units Humalog every 4 hours, keeping an eye on your sugars.  Try not to take Humalog before EVERY meal.  Please return in 3 months with your sugar log.

## 2017-06-18 NOTE — Addendum Note (Signed)
Addended by: Milford Cage on: 06/18/2017 03:47 PM   Modules accepted: Orders

## 2017-06-18 NOTE — Progress Notes (Signed)
Patient ID: Kathryn Beck, female   DOB: 23-Apr-1955, 62 y.o.   MRN: [id number]  HPI: Kathryn Beck is a 62 y.o.-year-old female, initially referred by her PCP, Dr. Moshe Cipro, returning for follow-up for of DM2, dx in 2008, insulin-dependent since ~2015, uncontrolled, with complications (CKD stage 4, PN). She daw Dr. Dorris Fetch - last OV with him 05/2016.  Last visit with me 1.5 months ago.  She decided for gastric bypass surgery>> will be scheduled next year.  However, we need to get her HbA1c down before then.  Last hemoglobin A1c was: Lab Results  Component Value Date   HGBA1C 11.5 03/19/2017   HGBA1C 12.5 (H) 11/25/2016   HGBA1C 11.1 (H) 06/09/2016   Pt was on a regimen of: - Humalog 75/25 20-26 units before each meal, 2-3X a day depending on the CBG before the meal  - Levemir 100 units at bedtime  At last visit, we changed to: - Levemir 50 units x 2 injections at bedtime - Humalog 3x a day 15 min before a meal - but not using the scale at gave her!:  Takes: 26-30 depending on the CBG before meals If you do not eat, you may need to inject ~10-15 units Humalog every 4 hours, keeping an eye on your sugars.  Pt checks her sugars 1-3 times a day -per review of her glucometer download: - am: 200-300, HI >> 165-387, 509, 540 (OJ) >> 232-493 - 2h after b'fast: n/c >> 202, 296, 462 - before lunch: 200s >> 161-250, 370 >> 164, 181, 487 - 2h after lunch: n/c - before dinner: 200-300 >> 598 >> 280-436 - 2h after dinner: n/c - bedtime: 200-300s >> 267 >> 422, 448 - nighttime: n/c Lowest sugar was 150 >> 161 >> 164; it is unclear at which level she has hypoglycemia awareness Highest sugar was HI >> 598 >> 487  Glucometer: Accuchek  Pt's meals are: - Breakfast: eggs, bacon, oatmeal, grits - Lunch: salads - Dinner: meat + veggie or sandwich + salad or nabs - Snacks: nabs, fruit cups, jello  -+ Stage IV CKD (Dr. Juleen China), last BUN/creatinine:  Lab Results  Component Value Date   BUN 42  (H) 04/28/2017   BUN 20 03/31/2017   CREATININE 2.91 (H) 04/28/2017   CREATININE 2.22 (H) 03/31/2017  On olmesartan 40. -+ HL; last set of lipids: Component     Latest Ref Rng & Units 02/25/2017  Cholesterol     <200 mg/dL 234 (H)  HDL Cholesterol     >50 mg/dL 38 (L)  Triglycerides     <150 mg/dL 596 (H)  LDL Cholesterol (Calc)     mg/dL (calc)   Total CHOL/HDL Ratio     <5.0 (calc) 6.2 (H)  Non-HDL Cholesterol (Calc)     <130 mg/dL (calc) 196 (H)   Previously: Component     Latest Ref Rng & Units 12/24/2016  Cholesterol     <200 mg/dL 334 (H)  HDL Cholesterol     >50 mg/dL 53  Triglycerides     <150 mg/dL 1,350 (H)  LDL Cholesterol (Calc)     mg/dL (calc)   Total CHOL/HDL Ratio     <5.0 (calc) 6.3 (H)  Non-HDL Cholesterol (Calc)     <130 mg/dL (calc) 281 (H)   On Zetia 10 and Lipitor 40. - last eye exam was on 09/2016: No DR -no numbness but she has tingling in her feet.  She also has HTN.  ROS: Constitutional: no weight gain/no weight  loss, + fatigue, no subjective hyperthermia, no subjective hypothermia, + nocturia Eyes: no blurry vision, no xerophthalmia ENT: no sore throat, no nodules palpated in throat, no dysphagia, no odynophagia, no hoarseness Cardiovascular: no CP/no SOB/no palpitations/+ leg swelling Respiratory: no cough/no SOB/no wheezing Gastrointestinal: + Nausea, heartburn, diarrhea, constipation Musculoskeletal: no muscle aches/no joint aches Skin: no rashes, no hair loss Neurological: no tremors/no numbness/no tingling/no dizziness  I reviewed pt's medications, allergies, PMH, social hx, family hx, and changes were documented in the history of present illness. Otherwise, unchanged from my initial visit note.  Past Medical History:  Diagnosis Date  . Acid reflux   . Anemia   . Arthritis   . Axillary masses    Soft tissue - status post excision  . Back pain   . Chronic kidney disease   . Essential hypertension   . Ischemic heart disease     Abnormal Myoview April 2018 - medical therapy  . Mixed hyperlipidemia   . Obesity   . Pancreatitis   . Sleep apnea    Noncompliant with CPAP  . Type 2 diabetes mellitus, uncontrolled ([place])    Past Surgical History:  Procedure Laterality Date  . ABDOMINAL HYSTERECTOMY    . COLONOSCOPY  2008   Dr. Oneida Alar: normal   . COLONOSCOPY N/A 12/18/2016   Dr. Oneida Alar: multiple tubular adenomas, internal hemorrhoids. Surveillance in 3 years   . ESOPHAGEAL DILATION N/A 10/13/2015   Procedure: ESOPHAGEAL DILATION;  Surgeon: Rogene Houston, MD;  Location: [facility];  Service: Endoscopy;  Laterality: N/A;  . ESOPHAGOGASTRODUODENOSCOPY N/A 10/13/2015   Dr. Laural Golden: chronic gastritis on path, no H.pylori. Empiric dilation   . ESOPHAGOGASTRODUODENOSCOPY N/A 12/18/2016   Dr. Oneida Alar: mild gastritis. BRAVO study revealed uncontrolled GERD. Dysphagia secondary to uncontrolled reflux  . FOOT SURGERY Bilateral   . LUNG BIOPSY    . MASS EXCISION Right 01/09/2013   Procedure: EXCISION OF NEOPLASM OF RIGHT  AXILLA  AND EXCISION OF NEOPLASM OF LEFT AXILLA;  Surgeon: Jamesetta So, MD;  Location: [facility];  Service: General;  Laterality: Right;  procedure end @ 08:23  . MYRINGOTOMY WITH TUBE PLACEMENT Bilateral 04/28/2017   Procedure: BILATERAL MYRINGOTOMY WITH TUBE PLACEMENT;  Surgeon: Leta Baptist, MD;  Location: [facility];  Service: ENT;  Laterality: Bilateral;  . SAVORY DILATION N/A 12/18/2016   Procedure: SAVORY DILATION;  Surgeon: Danie Binder, MD;  Location: [facility];  Service: Endoscopy;  Laterality: N/A;   Social History   Socioeconomic History  . Marital status: Married    Spouse name: Not on file  . Number of children: 2  Occupational History  . CNA  Tobacco Use  . Smoking status: Never Smoker  . Smokeless tobacco: Never Used  Substance and Sexual Activity  . Alcohol use: No  . Drug use: No   Current Outpatient Medications on File Prior to Visit  Medication Sig Dispense Refill  .  acetaminophen (TYLENOL) 500 MG tablet Take 1,000 mg by mouth every 6 (six) hours as needed for moderate pain or headache.    . allopurinol (ZYLOPRIM) 300 MG tablet TAKE 1 TABLET (300 MG TOTAL) BY MOUTH DAILY. 30 tablet 3  . amLODipine (NORVASC) 10 MG tablet Take 1 tablet (10 mg total) by mouth daily. 30 tablet 5  . Artificial Tear Ointment (DRY EYES OP) Place 2 drops into both eyes daily as needed (for dry eyes).    Marland Kitchen atorvastatin (LIPITOR) 20 MG tablet Take 1 tablet (20 mg total) by mouth  daily. 30 tablet 5  . azelastine (ASTELIN) 0.1 % nasal spray Place 2 sprays into both nostrils 2 (two) times daily. Use in each nostril as directed 30 mL 5  . calcitRIOL (ROCALTROL) 0.25 MCG capsule Take 0.25 mcg by mouth every Monday, Wednesday, and Friday.  36 capsule 0  . Cholecalciferol (VITAMIN D3 PO) Take 1 capsule by mouth daily.    . cloNIDine (CATAPRES) 0.1 MG tablet TAKE 1 TABLET BY MOUTH  EVERY EVENING AT 9PM FOR  BLOOD PRESSURE 30 tablet 5  . cyclobenzaprine (FLEXERIL) 10 MG tablet Take 10 mg by mouth daily as needed for muscle spasms.    Marland Kitchen dexlansoprazole (DEXILANT) 60 MG capsule 1 PO EVERY MORNING WITH BREAKFAST. 30 capsule 11  . dicyclomine (BENTYL) 10 MG capsule 1 po qachs if needed to prevent diarrhea and abdominal cramps 120 capsule 3  . ezetimibe (ZETIA) 10 MG tablet Take 1 tablet (10 mg total) by mouth daily. 30 tablet 5  . ferrous sulfate 325 (65 FE) MG tablet Take 1 tablet (325 mg total) by mouth daily with breakfast. 30 tablet 3  . furosemide (LASIX) 40 MG tablet Take 1 tablet (40 mg total) by mouth daily. 30 tablet 3  . gabapentin (NEURONTIN) 100 MG capsule Take 100 mg by mouth 3 (three) times daily.    Marland Kitchen HYDROcodone-acetaminophen (NORCO/VICODIN) 5-325 MG tablet 1/2 OR 1 EVERY 6 H PRN FOR PAIN 15 tablet 0  . hydrOXYzine (ATARAX/VISTARIL) 25 MG tablet Take 25 mg by mouth 3 (three) times daily.    . Insulin Detemir (LEVEMIR) 100 UNIT/ML Pen Inject 100 Units into the skin daily at 10 pm. 15  pen 3  . Insulin Lispro (HUMALOG KWIKPEN) 200 UNIT/ML SOPN Inject 26-34 Units into the skin 3 (three) times daily before meals. 15 pen 3  . isosorbide mononitrate (IMDUR) 30 MG 24 hr tablet Take 1 tablet (30 mg total) by mouth daily. 30 tablet 5  . labetalol (NORMODYNE) 200 MG tablet Take 2 tablets (400 mg total) by mouth 2 (two) times daily. 360 tablet 5  . lidocaine (XYLOCAINE) 2 % solution 2 TSP  WHEN NEEDED TO RELIVE FOR ABDOMINAL OR CHEST PAIN. MAY REPEAT DOSE EVERY 4 HOURS. NO MORE THAN 8 DOSES A DAY. 300 mL 1  . linaclotide (LINZESS) 145 MCG CAPS capsule Take 145 mcg by mouth daily before breakfast.    . montelukast (SINGULAIR) 10 MG tablet Take 10 mg by mouth at bedtime.    Marland Kitchen olmesartan (BENICAR) 40 MG tablet Take 1 tablet (40 mg total) by mouth daily. 30 tablet 5  . pantoprazole (PROTONIX) 40 MG tablet Take 40 mg by mouth 2 (two) times daily before a meal.     . Phenyleph-Doxylamine-DM-APAP (ALKA-SELTZER PLS ALLERGY & [facility] PO) Take 2 tablets by mouth daily as needed (for cold).     . rosuvastatin (CRESTOR) 5 MG tablet Take 5 mg by mouth daily.    Marland Kitchen spironolactone (ALDACTONE) 25 MG tablet Take 25 mg by mouth 2 (two) times daily.    Marland Kitchen triamterene-hydrochlorothiazide (MAXZIDE) 75-50 MG tablet Take 1 tablet by mouth daily.    . valsartan (DIOVAN) 320 MG tablet Take 320 mg by mouth daily.    . Vitamin D, Ergocalciferol, (DRISDOL) 50000 units CAPS capsule Take 1 capsule (50,000 Units total) by mouth every 7 (seven) days. 4 capsule 5  . Vitamin D, Ergocalciferol, (DRISDOL) 50000 units CAPS capsule TAKE 1 CAPSULE (50,000 UNITS TOTAL) BY MOUTH EVERY 7 (SEVEN) DAYS. 4 capsule 0  . [  DISCONTINUED] FLUoxetine (PROZAC) 10 MG capsule Take 10 mg by mouth daily.      . [DISCONTINUED] glipiZIDE (GLUCOTROL) 10 MG tablet Take 10 mg by mouth 2 (two) times daily before a meal.       No current facility-administered medications on file prior to visit.    Allergies  Allergen Reactions  . Ace Inhibitors  Anaphylaxis and Swelling  . Penicillins Itching, Swelling and Other (See Comments)    Has patient had a PCN reaction causing immediate rash, facial/tongue/throat swelling, SOB or lightheadedness with hypotension: Yes Has patient had a PCN reaction causing severe rash involving mucus membranes or skin necrosis: No Has patient had a PCN reaction that required hospitalization No Has patient had a PCN reaction occurring within the last 10 years: No If all of the above answers are "NO", then may proceed with Cephalosporin use.   . Statins Other (See Comments)    elevated LFT's   Family History  Problem Relation Age of Onset  . Hypertension Father   . Hypercholesterolemia Father   . Arthritis Father   . Hypertension Sister   . Hypercholesterolemia Sister   . Breast cancer Sister   . Hypertension Sister   . Colon cancer Neg Hx   . Colon polyps Neg Hx     PE: BP (!) 180/100 (BP Location: Left Arm, Cuff Size: Normal)   Pulse (!) 105   Ht 5\' 4"  (1.626 m)   Wt 197 lb 9.6 oz (89.6 kg)   SpO2 98%   BMI 33.92 kg/m  Wt Readings from Last 3 Encounters:  06/18/17 197 lb 9.6 oz (89.6 kg)  06/09/17 199 lb 6.4 oz (90.4 kg)  04/28/17 200 lb (90.7 kg)   Constitutional: overweight, in NAD Eyes: PERRLA, EOMI, no exophthalmos ENT: moist mucous membranes, no thyromegaly, no cervical lymphadenopathy Cardiovascular: Tachycardia, RR, No MRG Respiratory: CTA B Gastrointestinal: abdomen soft, NT, ND, BS+ Musculoskeletal: no deformities, strength intact in all 4 Skin: moist, warm, no rashes Neurological: no tremor with outstretched hands, DTR normal in all 4  ASSESSMENT: 1. DM2, insulin-dependent, uncontrolled, with complications - CKD stage 4 - PN  2. HL  3. HTN  PLAN:  1. Patient with long-standing, uncontrolled, diabetes, previously on premixed insulin and long-acting insulin, changed to basal-bolus insulin regimen before last visit.  Sugars improved but at last visit she was still  drinking OJ and I advised her to stop.  She was skipping insulin doses, also as she was not eating well due to a URI.  We discussed about sick day rules - At this visit, she is not using the recommended doses of Humalog, but uses a sliding scale.  She cannot remember specifics.  She is also missing many Humalog doses especially when she is out.  We discussed about taking the pen of Humalog with her and taking it before every single meal.  We will also increase her Humalog and advised her how to use it. - We will also try to switch from Levemir to [facility] for better site absorption since she is using high doses. - I suggested to:  Patient Instructions  Please continue: - Levemir 50 units x 2 injections at bedtime But try to use instead, Toujeo 50 units x2 injections  Please increase: - Humalog 3x a day 15 min before a meal: - 32 units before a smaller meal - 36 units before a larger meal If you do not eat, you may need to inject ~10-15 units Humalog every 4 hours, keeping  an eye on your sugars.  Try not to take Humalog before EVERY meal.  Please return in 3 months with your sugar log.    - today, HbA1c is 11.2% (still high) - continue checking sugars at different times of the day - check 4x a day, rotating checks - advised for yearly eye exams >> she is UTD - Return to clinic in 3 mo with sugar log    2. HL -Reviewed latest lipid panel from 02/2017: Triglycerides still very high, but improving.  LDL could not be calculated. - Continue Zetia and Lipitor without side effects -Again discussed about the importance of a low fat diet, also low in concentrated sweets  3. HTN -Missed blood pressure medicines in the last 3 days -Advised her to go home and immediately start taking them  [name], MD PhD [facility] Endocrinology

## 2017-06-18 NOTE — Telephone Encounter (Signed)
Per dr, I d/c'd the gemfibrozil that was refilled from our office and advised [place] to tell pt to discuss with cardiologist

## 2017-06-24 ENCOUNTER — Other Ambulatory Visit: Payer: Self-pay

## 2017-06-24 DIAGNOSIS — IMO0002 Reserved for concepts with insufficient information to code with codable children: Secondary | ICD-10-CM

## 2017-06-24 DIAGNOSIS — E1121 Type 2 diabetes mellitus with diabetic nephropathy: Secondary | ICD-10-CM

## 2017-06-24 DIAGNOSIS — E1165 Type 2 diabetes mellitus with hyperglycemia: Principal | ICD-10-CM

## 2017-06-24 MED ORDER — BASAGLAR KWIKPEN 100 UNIT/ML ~~LOC~~ SOPN: 100.0000 [IU] | PEN_INJECTOR | Freq: Every day | SUBCUTANEOUS | 11 refills | Status: DC

## 2017-07-05 ENCOUNTER — Ambulatory Visit (INDEPENDENT_AMBULATORY_CARE_PROVIDER_SITE_OTHER): Payer: 59 | Admitting: Podiatry

## 2017-07-05 ENCOUNTER — Encounter: Payer: Self-pay | Admitting: Podiatry

## 2017-07-05 DIAGNOSIS — E0843 Diabetes mellitus due to underlying condition with diabetic autonomic (poly)neuropathy: Secondary | ICD-10-CM

## 2017-07-05 DIAGNOSIS — M79673 Pain in unspecified foot: Secondary | ICD-10-CM | POA: Diagnosis not present

## 2017-07-05 MED ORDER — GABAPENTIN 100 MG PO CAPS: 100.0000 mg | ORAL_CAPSULE | Freq: Three times a day (TID) | ORAL | 3 refills | Status: DC

## 2017-07-08 ENCOUNTER — Encounter: Payer: Self-pay | Admitting: Family Medicine

## 2017-07-08 ENCOUNTER — Ambulatory Visit (INDEPENDENT_AMBULATORY_CARE_PROVIDER_SITE_OTHER): Payer: 59 | Admitting: Family Medicine

## 2017-07-08 VITALS — BP 120/70 | HR 73 | Resp 16 | Ht 64.0 in | Wt 212.0 lb

## 2017-07-08 DIAGNOSIS — R296 Repeated falls: Secondary | ICD-10-CM | POA: Diagnosis not present

## 2017-07-08 DIAGNOSIS — N184 Chronic kidney disease, stage 4 (severe): Secondary | ICD-10-CM

## 2017-07-08 DIAGNOSIS — R2681 Unsteadiness on feet: Secondary | ICD-10-CM | POA: Diagnosis not present

## 2017-07-08 DIAGNOSIS — D631 Anemia in chronic kidney disease: Secondary | ICD-10-CM | POA: Diagnosis not present

## 2017-07-08 DIAGNOSIS — E782 Mixed hyperlipidemia: Secondary | ICD-10-CM

## 2017-07-08 DIAGNOSIS — Z794 Long term (current) use of insulin: Secondary | ICD-10-CM | POA: Diagnosis not present

## 2017-07-08 DIAGNOSIS — E66812 Obesity, class 2: Secondary | ICD-10-CM

## 2017-07-08 DIAGNOSIS — E1121 Type 2 diabetes mellitus with diabetic nephropathy: Secondary | ICD-10-CM

## 2017-07-08 DIAGNOSIS — IMO0002 Reserved for concepts with insufficient information to code with codable children: Secondary | ICD-10-CM

## 2017-07-08 DIAGNOSIS — I10 Essential (primary) hypertension: Secondary | ICD-10-CM | POA: Diagnosis not present

## 2017-07-08 DIAGNOSIS — E1165 Type 2 diabetes mellitus with hyperglycemia: Secondary | ICD-10-CM | POA: Diagnosis not present

## 2017-07-08 DIAGNOSIS — Z6838 Body mass index (BMI) 38.0-38.9, adult: Secondary | ICD-10-CM | POA: Diagnosis not present

## 2017-07-08 MED ORDER — VITAMIN D (ERGOCALCIFEROL) 1.25 MG (50000 UNIT) PO CAPS: 50000.0000 [IU] | ORAL_CAPSULE | ORAL | 5 refills | Status: DC

## 2017-07-08 NOTE — Patient Instructions (Addendum)
F/u in 4 months, call if you need me sooner  Need eye exam  CBC, fasting lipid, cmp and EGFr as soon as possible    pls make and keep appt with nephrology , I will refer   Excellent bP  Lasix is changed to twice daily as needed  You are being referred to PT twice weekly for 6 weeks out patient  Thank you  for choosing [facility]. We consider it a privelige to serve you.  Delivering excellent health care in a caring and  compassionate way is our goal.  Partnering with you,  so that together we can achieve this goal is our strategy.

## 2017-07-08 NOTE — Progress Notes (Signed)
Kathryn Beck     MRN: [id number]      DOB: [birth year]   HPI Kathryn Beck is here for follow up and re-evaluation of chronic medical conditions, medication management and review of any available recent lab and radiology data.  Preventive health is updated, specifically  Cancer screening and Immunization.   Questions or concerns regarding consultations or procedures which the PT has had in the interim are  Addressed. Feels good about referrals, more hopeful as far far as her diabetes is concerned, has had cardiology evaluation and needs eye exam Needs to see nephrology, c/o bilateral leg swelling, she has CKD The PT denies any adverse reactions to current medications since the last visit.  Lower extremity weakness and approximately 3 falls in past 3 months, weakness noted especially in left leg fell last week when she was working in her flowers  ROS Denies recent fever or chills. Denies sinus pressure, nasal congestion, ear pain or sore throat. Denies chest congestion, productive cough or wheezing. Denies chest pains, palpitations , PND or orthopnea Denies abdominal pain, nausea, vomiting,diarrhea or constipation.   Denies dysuria, frequency, hesitancy or incontinence. Denies joint pain, swelling . Denies headaches, seizures, numbness, or tingling. Denies depression, anxiety or insomnia. Denies skin break down or rash.   PE  BP 138/82   Pulse 73   Resp 16   Ht 5\' 4"  (1.626 m)   Wt 212 lb (96.2 kg)   SpO2 98%   BMI 36.39 kg/m   Patient alert and oriented and in no cardiopulmonary distress.  HEENT: No facial asymmetry, EOMI,   oropharynx pink and moist.  Neck supple no JVD, no mass.  Chest: Clear to auscultation bilaterally.  CVS: S1, S2 no murmurs, no S3.Regular rate.  ABD: Soft non tender.   Ext: 2 plus pitting edema  MS: Adequate ROM spine, shoulders, hips and knees.  Skin: Intact, no ulcerations or rash noted.  Psych: Good eye contact, normal affect. Memory  intact not anxious or depressed appearing.  CNS: CN 2-12 intact, power,  normal throughout.no focal deficits noted.   Assessment & Plan  CKD (chronic kidney disease) stage 4, GFR 15-29 ml/min (HCC) diabetic and hypertensive nephropathy with anemia, needs to re establish with nephrology. Importance improved blood sugar control is stressed.and plant based low sodium diet Pt has bilateral edema, increase furosemide to twice daily, she reports improved diuresis on that dose   FATIGUE Multifactorial, however, anemia of CKD undoubtedly playing a part, and when this is addressed, she will undoubtedly improve  Mixed hyperlipidemia Hyperlipidemia:Low fat diet discussed and encouraged. Updated lab needed at/ before next visit. uncontrolled   Lipid Panel  Lab Results  Component Value Date   CHOL 234 (H) 02/25/2017   HDL 38 (L) 02/25/2017   [place]  02/25/2017     Comment:     . LDL cholesterol not calculated. Triglyceride levels greater than 400 mg/dL invalidate calculated LDL results. . Reference range: <100 . Desirable range <100 mg/dL for primary prevention;   <70 mg/dL for patients with CHD or diabetic patients  with > or = 2 CHD risk factors. Marland Kitchen LDL-C is now calculated using the Martin-Hopkins  calculation, which is a validated novel method providing  better accuracy than the Friedewald equation in the  estimation of LDL-C.  Cresenciano Genre et al. Annamaria Helling. 0109;323(55): 2061-2068  (http://education.[employer].com/faq/FAQ164)    LDLDIRECT 107 05/19/2016   TRIG 414 (H) 03/24/2017   CHOLHDL 6.2 (H) 02/25/2017       Malignant  hypertension Controlled, no change in medication DASH diet and commitment to daily physical activity for a minimum of 30 minutes discussed and encouraged, as a part of hypertension management. The importance of attaining a healthy weight is also discussed.  BP/Weight 07/08/2017 06/18/2017 06/09/2017 04/28/2017 04/27/2017 1/60/1093 04/26/5571  Systolic BP 220  254 270 623 762 831 517  Diastolic BP 70 616 86 84 84 84 70  Wt. (Lbs) 212 197.6 199.4 200 200 203.6 -  BMI 36.39 33.92 34.23 34.33 34.33 34.95 -       Uncontrolled diabetes mellitus with diabetic nephropathy, with long-term current use of insulin (HCC) Uncontrolled, needs eye exam Reports being more engaged and feeling better about her diabetes with a new Provider Kathryn Beck is reminded of the importance of commitment to daily physical activity for 30 minutes or more, as able and the need to limit carbohydrate intake to 30 to 60 grams per meal to help with blood sugar control.   The need to take medication as prescribed, test blood sugar as directed, and to call between visits if there is a concern that blood sugar is uncontrolled is also discussed.   Kathryn Beck is reminded of the importance of daily foot exam, annual eye examination, and good blood sugar, blood pressure and cholesterol control.  Diabetic Labs Latest Ref Rng & Units 06/18/2017 04/28/2017 03/31/2017 03/28/2017 03/26/2017  HbA1c - 11.2 - - - -  Microalbumin Not estab mg/dL - - - - -  Micro/Creat Ratio <30 mcg/mg creat - - - - -  Chol <200 mg/dL - - - - -  HDL >50 mg/dL - - - - -  Calc LDL <100 mg/dL - - - - -  Triglycerides <150 mg/dL - - - - -  Creatinine 0.44 - 1.00 mg/dL - 2.91(H) 2.22(H) 2.47(H) 2.57(H)   BP/Weight 07/08/2017 06/18/2017 06/09/2017 04/28/2017 04/27/2017 0/73/7106 04/29/9483  Systolic BP 462 703 500 938 182 993 716  Diastolic BP 70 967 86 84 84 84 70  Wt. (Lbs) 212 197.6 199.4 200 200 203.6 -  BMI 36.39 33.92 34.23 34.33 34.33 34.95 -   Foot/eye exam completion dates Latest Ref Rng & Units 08/24/2016 01/13/2016  Eye Exam No Retinopathy - No Retinopathy  Foot Form Completion - Done -         Obesity Deteriorated. Patient re-educated about  the importance of commitment to a  minimum of 150 minutes of exercise per week.  The importance of healthy food choices with portion control discussed. Encouraged to  start a food diary, count calories and to consider  joining a support group. Sample diet sheets offered. Goals set by the patient for the next several months.   Weight /BMI 07/08/2017 06/18/2017 06/09/2017  WEIGHT 212 lb 197 lb 9.6 oz 199 lb 6.4 oz  HEIGHT 5\' 4"  5\' 4"  5\' 4"   BMI 36.39 kg/m2 33.92 kg/m2 34.23 kg/m2      Recurrent falls Pt  Reports recurrent falls  And lower extremity weakness in the past 3 months, requests physical therapy to improve gait and reduce fall risk

## 2017-07-09 NOTE — Progress Notes (Signed)
   HPI: 62 year old female with PMHx of T2DM presenting today as a new patient with a chief complaint of tingling to the plantar aspects of bilateral feet that began about 7 months ago. There are no modifying factors noted. She has not had any treatment for the symptoms. Patient is here for further evaluation and treatment.   Past Medical History:  Diagnosis Date  . Acid reflux   . Anemia   . Arthritis   . Axillary masses    Soft tissue - status post excision  . Back pain   . Chronic kidney disease   . Essential hypertension   . Ischemic heart disease    Abnormal Myoview April 2018 - medical therapy  . Mixed hyperlipidemia   . Obesity   . Pancreatitis   . Sleep apnea    Noncompliant with CPAP  . Type 2 diabetes mellitus, uncontrolled ([place])      Physical Exam: General: The patient is alert and oriented x3 in no acute distress.  Dermatology: Skin is warm, dry and supple bilateral lower extremities. Negative for open lesions or macerations.  Vascular: Palpable pedal pulses bilaterally. No edema or erythema noted. Capillary refill within normal limits.  Neurological: Epicritic and protective threshold diminished bilaterally.   Musculoskeletal Exam: Range of motion within normal limits to all pedal and ankle joints bilateral. Muscle strength 5/5 in all groups bilateral.   Assessment: 1. T2DM with peripheral neuropathy    Plan of Care:  1. Patient evaluated.   2. Prescription for gabapentin 100 mg TID provided to patient.  3. Recommended New Balance walking shoes.  4. Return to clinic annually.       Edrick Kins, DPM [facility]  Dr. Edrick Kins, DPM    [address], [place] [address]                Office [phone]  Fax [phone]

## 2017-07-11 DIAGNOSIS — R296 Repeated falls: Secondary | ICD-10-CM | POA: Insufficient documentation

## 2017-07-11 DIAGNOSIS — R2681 Unsteadiness on feet: Secondary | ICD-10-CM | POA: Insufficient documentation

## 2017-07-11 MED ORDER — FUROSEMIDE 40 MG PO TABS: ORAL_TABLET | ORAL | 3 refills | Status: DC

## 2017-07-11 NOTE — Assessment & Plan Note (Signed)
Controlled, no change in medication DASH diet and commitment to daily physical activity for a minimum of 30 minutes discussed and encouraged, as a part of hypertension management. The importance of attaining a healthy weight is also discussed.  BP/Weight 07/08/2017 06/18/2017 06/09/2017 04/28/2017 04/27/2017 5/36/6440 05/25/7423  Systolic BP 956 387 564 332 951 884 166  Diastolic BP 70 063 86 84 84 84 70  Wt. (Lbs) 212 197.6 199.4 200 200 203.6 -  BMI 36.39 33.92 34.23 34.33 34.33 34.95 -

## 2017-07-11 NOTE — Assessment & Plan Note (Signed)
Deteriorated. Patient re-educated about  the importance of commitment to a  minimum of 150 minutes of exercise per week.  The importance of healthy food choices with portion control discussed. Encouraged to start a food diary, count calories and to consider  joining a support group. Sample diet sheets offered. Goals set by the patient for the next several months.   Weight /BMI 07/08/2017 06/18/2017 06/09/2017  WEIGHT 212 lb 197 lb 9.6 oz 199 lb 6.4 oz  HEIGHT 5\' 4"  5\' 4"  5\' 4"   BMI 36.39 kg/m2 33.92 kg/m2 34.23 kg/m2

## 2017-07-11 NOTE — Assessment & Plan Note (Addendum)
diabetic and hypertensive nephropathy with anemia, needs to re establish with nephrology. Importance improved blood sugar control is stressed.and plant based low sodium diet Pt has bilateral edema, increase furosemide to twice daily, she reports improved diuresis on that dose

## 2017-07-11 NOTE — Assessment & Plan Note (Signed)
Uncontrolled, needs eye exam Reports being more engaged and feeling better about her diabetes with a new Provider [name] is reminded of the importance of commitment to daily physical activity for 30 minutes or more, as able and the need to limit carbohydrate intake to 30 to 60 grams per meal to help with blood sugar control.   The need to take medication as prescribed, test blood sugar as directed, and to call between visits if there is a concern that blood sugar is uncontrolled is also discussed.   Ms. Favor is reminded of the importance of daily foot exam, annual eye examination, and good blood sugar, blood pressure and cholesterol control.  Diabetic Labs Latest Ref Rng & Units 06/18/2017 04/28/2017 03/31/2017 03/28/2017 03/26/2017  HbA1c - 11.2 - - - -  Microalbumin Not estab mg/dL - - - - -  Micro/Creat Ratio <30 mcg/mg creat - - - - -  Chol <200 mg/dL - - - - -  HDL >50 mg/dL - - - - -  Calc LDL <100 mg/dL - - - - -  Triglycerides <150 mg/dL - - - - -  Creatinine 0.44 - 1.00 mg/dL - 2.91(H) 2.22(H) 2.47(H) 2.57(H)   BP/Weight 07/08/2017 06/18/2017 06/09/2017 04/28/2017 04/27/2017 0/63/0160 1/0/9323  Systolic BP 557 322 025 427 062 376 283  Diastolic BP 70 151 86 84 84 84 70  Wt. (Lbs) 212 197.6 199.4 200 200 203.6 -  BMI 36.39 33.92 34.23 34.33 34.33 34.95 -   Foot/eye exam completion dates Latest Ref Rng & Units 08/24/2016 01/13/2016  Eye Exam No Retinopathy - No Retinopathy  Foot Form Completion - Done -

## 2017-07-11 NOTE — Assessment & Plan Note (Signed)
Hyperlipidemia:Low fat diet discussed and encouraged. Updated lab needed at/ before next visit. uncontrolled   Lipid Panel  Lab Results  Component Value Date   CHOL 234 (H) 02/25/2017   HDL 38 (L) 02/25/2017   [place]  02/25/2017     Comment:     . LDL cholesterol not calculated. Triglyceride levels greater than 400 mg/dL invalidate calculated LDL results. . Reference range: <100 . Desirable range <100 mg/dL for primary prevention;   <70 mg/dL for patients with CHD or diabetic patients  with > or = 2 CHD risk factors. Marland Kitchen LDL-C is now calculated using the Martin-Hopkins  calculation, which is a validated novel method providing  better accuracy than the Friedewald equation in the  estimation of LDL-C.  Cresenciano Genre et al. Annamaria Helling. 9191;660(60): 2061-2068  (http://education.[facility].com/faq/FAQ164)    LDLDIRECT 107 05/19/2016   TRIG 414 (H) 03/24/2017   CHOLHDL 6.2 (H) 02/25/2017

## 2017-07-11 NOTE — Assessment & Plan Note (Signed)
Multifactorial, however, anemia of CKD undoubtedly playing a part, and when this is addressed, she will undoubtedly improve

## 2017-07-11 NOTE — Assessment & Plan Note (Signed)
Pt  Reports recurrent falls  And lower extremity weakness in the past 3 months, requests physical therapy to improve gait and reduce fall risk

## 2017-07-12 NOTE — Addendum Note (Signed)
Addended by: Eual Fines on: 07/12/2017 08:16 AM   Modules accepted: Orders

## 2017-07-15 ENCOUNTER — Telehealth: Payer: Self-pay | Admitting: Family Medicine

## 2017-07-15 NOTE — Telephone Encounter (Signed)
Mitzi Hansen, pharmacist at [facility] in [place], left message on nurse line regarding patient. He states patient is taking olmesartan which is currently on back order. He states that irbesartan is also on back order. He states he has losartan, that is not affected by the recall, in stock, as well as candesartan and valsartan. He states olmesartan should be back in stock June 1st. Please advise.  Callback# [id number]

## 2017-07-16 ENCOUNTER — Other Ambulatory Visit: Payer: Self-pay

## 2017-07-16 MED ORDER — OLMESARTAN MEDOXOMIL 40 MG PO TABS: 40.0000 mg | ORAL_TABLET | Freq: Every day | ORAL | 2 refills | Status: DC

## 2017-07-16 NOTE — Telephone Encounter (Signed)
Will send to [facility]. Pt is aware

## 2017-07-16 NOTE — Telephone Encounter (Signed)
pls call [facility] and see if they have access to benicar 40 mg 2 month supply if so advise pt to have the prescription filled there and send there I will cosign,pefer not to keep changing to med equivalents, thanks, send me back if a problem please

## 2017-07-20 ENCOUNTER — Ambulatory Visit (INDEPENDENT_AMBULATORY_CARE_PROVIDER_SITE_OTHER): Payer: PRIVATE HEALTH INSURANCE | Admitting: Psychiatry

## 2017-07-20 ENCOUNTER — Telehealth: Payer: Self-pay

## 2017-07-20 DIAGNOSIS — F509 Eating disorder, unspecified: Secondary | ICD-10-CM

## 2017-07-20 NOTE — Telephone Encounter (Signed)
Adam with [facility] calling regarding patients Benicar. Please call back. [phone]

## 2017-07-20 NOTE — Telephone Encounter (Signed)
Spoke with Kathryn Beck at [facility]. He said they received the fax stating Benicar had been sent to another pharmacy because it was on back order for them. He is letting us know that this patient participates in the compliance package program (the medication is bubble packaged for her). If Dr. Moshe Cipro is willing to change the prescription, they can add it to her packages. She only has a couple of days left. I told him I would find out and let him know either way.

## 2017-07-20 NOTE — Telephone Encounter (Signed)
Spoke with Velna Hatchet regarding patient and her Benicar and she stated Dr.Simpson wanted to keep patient on Benicar and the prescription into [place] and patient had been notified to pick it up. I called Adam with [facility] and let him know.

## 2017-07-28 ENCOUNTER — Encounter (HOSPITAL_COMMUNITY): Payer: Self-pay

## 2017-07-28 ENCOUNTER — Ambulatory Visit (HOSPITAL_COMMUNITY): Payer: 59 | Attending: Family Medicine

## 2017-07-28 DIAGNOSIS — M6281 Muscle weakness (generalized): Secondary | ICD-10-CM | POA: Diagnosis not present

## 2017-07-28 DIAGNOSIS — R262 Difficulty in walking, not elsewhere classified: Secondary | ICD-10-CM

## 2017-07-28 DIAGNOSIS — Z9181 History of falling: Secondary | ICD-10-CM | POA: Diagnosis present

## 2017-07-28 NOTE — Therapy (Signed)
[place], [place], [address] Phone: [phone]   Fax:  [phone]  Physical Therapy Evaluation  Patient Details  Name: Kathryn Beck MRN: [id number] Date of Birth: 28-Mar-1955 Referring Provider: Fayrene Helper, MD   Encounter Date: 07/28/2017  PT End of Session - 07/28/17 1435    Visit Number  1    Number of Visits  13    Date for PT Re-Evaluation  09/08/17 mini reassess 08/18/17    Authorization Type  [employer]    Authorization Time Period  07/28/17 to 09/08/16    Authorization - Visit Number  1    Authorization - Number of Visits  15    PT Start Time  5462 pt late    PT Stop Time  1344    PT Time Calculation (min)  27 min    Equipment Utilized During Treatment  Gait belt    Activity Tolerance  Patient tolerated treatment well    Behavior During Therapy  [facility] for tasks assessed/performed       Past Medical History:  Diagnosis Date  . Acid reflux   . Anemia   . Arthritis   . Axillary masses    Soft tissue - status post excision  . Back pain   . Chronic kidney disease   . Essential hypertension   . Ischemic heart disease    Abnormal Myoview April 2018 - medical therapy  . Mixed hyperlipidemia   . Obesity   . Pancreatitis   . Sleep apnea    Noncompliant with CPAP  . Type 2 diabetes mellitus, uncontrolled ([place])     Past Surgical History:  Procedure Laterality Date  . ABDOMINAL HYSTERECTOMY    . COLONOSCOPY  2008   Dr. Oneida Alar: normal   . COLONOSCOPY N/A 12/18/2016   Dr. Oneida Alar: multiple tubular adenomas, internal hemorrhoids. Surveillance in 3 years   . ESOPHAGEAL DILATION N/A 10/13/2015   Procedure: ESOPHAGEAL DILATION;  Surgeon: Rogene Houston, MD;  Location: [facility];  Service: Endoscopy;  Laterality: N/A;  . ESOPHAGOGASTRODUODENOSCOPY N/A 10/13/2015   Dr. Laural Golden: chronic gastritis on path, no H.pylori. Empiric dilation   . ESOPHAGOGASTRODUODENOSCOPY N/A 12/18/2016   Dr.  Oneida Alar: mild gastritis. BRAVO study revealed uncontrolled GERD. Dysphagia secondary to uncontrolled reflux  . FOOT SURGERY Bilateral   . LUNG BIOPSY    . MASS EXCISION Right 01/09/2013   Procedure: EXCISION OF NEOPLASM OF RIGHT  AXILLA  AND EXCISION OF NEOPLASM OF LEFT AXILLA;  Surgeon: Jamesetta So, MD;  Location: [facility];  Service: General;  Laterality: Right;  procedure end @ 08:23  . MYRINGOTOMY WITH TUBE PLACEMENT Bilateral 04/28/2017   Procedure: BILATERAL MYRINGOTOMY WITH TUBE PLACEMENT;  Surgeon: Leta Baptist, MD;  Location: [place] [place];  Service: ENT;  Laterality: Bilateral;  . SAVORY DILATION N/A 12/18/2016   Procedure: SAVORY DILATION;  Surgeon: Danie Binder, MD;  Location: [facility];  Service: Endoscopy;  Laterality: N/A;    There were no vitals filed for this visit.   Subjective Assessment - 07/28/17 1319    Subjective  Pt states that her legs give out and they don't have any strenght in them. Her ankles and feet are real weak; sometimes she has to use her BUE to assist with moving/lifting her legs. She reports that this has been going on for 6 months, maybe longer, and it started insidiously. She has had probably 2 falls in the last 6 months, when her  legs gave out or she had difficulty picking up her legs and tripped over a threshold. She denies any bouts of vertigo or dizziness when she has fallen.     Limitations  Walking;Standing    How long can you sit comfortably?  no issues    How long can you stand comfortably?  about 30 mins    How long can you walk comfortably?  5-10 mins    Patient Stated Goals  legs not bother me and walk better    Currently in Pain?  No/denies         [facility] PT Assessment - 07/28/17 0001      Assessment   Medical Diagnosis  unsteady gait, recurrent falls    Referring Provider  Fayrene Helper, MD    Onset Date/Surgical Date  01/28/17    Next MD Visit  June 2019    Prior Therapy  years ago for ankles      Precautions   Precautions  Fall       Balance Screen   Has the patient fallen in the past 6 months  Yes    How many times?  2    Has the patient had a decrease in activity level because of a fear of falling?   Yes    Is the patient reluctant to leave their home because of a fear of falling?   No      Prior Function   Level of Independence  Independent    Vocation  Full time employment    Vocation Requirements  CNA    Leisure  garden, [place], church      Observation/Other Assessments   Focus on Therapeutic Outcomes (FOTO)   to be completed next visit      Sensation   Light Touch  Appears Intact      Functional Tests   Functional tests  Sit to Stand      Sit to Stand   Comments  30 sec chair rise test: 10      ROM / Strength   AROM / PROM / Strength  Strength      Strength   Strength Assessment Site  Hip;Knee;Ankle    Right Hip Flexion  3-/5    Right Hip Extension  3+/5    Right Hip ABduction  3+/5    Left Hip Flexion  3-/5    Left Hip Extension  3+/5    Left Hip ABduction  3-/5    Right Knee Flexion  4+/5    Right Knee Extension  5/5    Left Knee Flexion  4+/5    Left Knee Extension  5/5    Right Ankle Dorsiflexion  5/5    Left Ankle Dorsiflexion  4+/5      Balance   Balance Assessed  Yes      Static Standing Balance   Static Standing - Balance Support  No upper extremity supported    Static Standing Balance -  Activities   Single Leg Stance - Right Leg;Single Leg Stance - Left Leg    Static Standing - Comment/# of Minutes  R: 1 sec or < L: 2 sec or <      Standardized Balance Assessment   Standardized Balance Assessment  Dynamic Gait Index      Dynamic Gait Index   Level Surface  Normal    Change in Gait Speed  Mild Impairment    Gait with Horizontal Head Turns  Normal    Gait  with Vertical Head Turns  Mild Impairment    Gait and Pivot Turn  Normal    Step Over Obstacle  Moderate Impairment    Step Around Obstacles  Normal    Steps  Mild Impairment    Total Score  19             Objective measurements completed on examination: See above findings.        PT Short Term Goals - 07/28/17 1439      PT SHORT TERM GOAL #1   Title  Pt will be independent with HEP and perform consistently in order to improve strength and derease risk for falls.    Time  3    Period  Weeks    Status  New    Target Date  08/18/17      PT SHORT TERM GOAL #2   Title  Ptw will score 22/24 on the DGI to demo improved dynamic balance and decrease risk for falls during ambulation.    Time  3    Period  Weeks    Status  New      PT SHORT TERM GOAL #3   Title  Pt will be able to perform bil SLS for 5 sec wiht no UE to maximize gait and demo improved balance and functional strength.    Time  3    Period  Weeks    Status  New      PT SHORT TERM GOAL #4   Title  --        PT Long Term Goals - 07/28/17 1443      PT LONG TERM GOAL #1   Title  Pt will have improved MMT of proximal hip musculature to at least 4/5 throughout in order to maximize pt's gait and ability to ambulate stairs with greater ease.    Time  6    Period  Weeks    Status  New    Target Date  09/08/17      PT LONG TERM GOAL #2   Title  Pt will be able to perform bil SLS for 10 sec or > with no UE to further demo improved balance maximize gait and decrease risk for falls.    Time  6    Period  Weeks    Status  New      PT LONG TERM GOAL #3   Title  Pt will be able to perform 13 or more STS during 30s chair rise test in order to demo improved functional strength and balance to decrease her risk for falls.    Time  6    Period  Weeks    Status  New      PT LONG TERM GOAL #4   Title  Pt will improve in the 6MWT by 130ft or > without requring a rest break in order to demo improved muscular endurance, tolerance to ambulation, and maximize her community access.     Time  6    Period  Weeks    Status  New             Plan - 07/28/17 1436    Clinical Impression Statement  Pt is pleasant 62YO F who  presents to OPPT with c/o BLE weakness and 2 falls in last 6 months. Evaluation limited as she arrived late for her appointment so further objective measures will be performed at her f/u visit. She currently presents with deficits in BLE strength, especially  of proximal hip musculature, balance, functional strength, and dynamic balance. She is at increased risk for falls AEB SLS time, performing <12 STS during 30 sec chair rise test, and 19/24 on the DGI. Pt needs skilled PT intervention to address these deficits in order to improve strength and decrease risk for falls.     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 Moshe Cipro following    Clinical Presentation  Stable    Clinical Presentation due to:  MMT, SLS, gait, functional strength, 30s chair rise test, DGI    Clinical Decision Making  Low    Rehab Potential  Fair    PT Frequency  2x / week    PT Duration  6 weeks    PT Treatment/Interventions  ADLs/Self Care Home Management;Cryotherapy;Electrical Stimulation;Moist Heat;Traction;Ultrasound;DME Instruction;Gait training;Stair training;Functional mobility training;Therapeutic activities;Therapeutic exercise;Balance training;Neuromuscular re-education;Patient/family education;Manual techniques;Manual lymph drainage;Compression bandaging;Passive range of motion;Dry needling;Energy conservation;Taping;Vestibular    PT Next Visit Plan  review goals; perform FOTO, 6MWT; initiate BLE, core, functinoal strengthening and balance training within pt's tolerance    PT Home Exercise Plan  eval: STS no UE, bridging    Consulted and Agree with Plan of Care  Patient       Patient will benefit from skilled therapeutic intervention in order to improve the following deficits  and impairments:  Decreased activity tolerance, Decreased balance, Decreased endurance, Decreased mobility, Decreased strength, Difficulty walking, Hypomobility, Increased edema, Postural dysfunction, Improper body mechanics, Pain, Obesity  Visit Diagnosis: Muscle weakness (generalized) - Plan: PT plan of care cert/re-cert  History of falling - Plan: PT plan of care cert/re-cert  Difficulty in walking, not elsewhere classified - Plan: PT plan of care cert/re-cert     Problem List Patient Active Problem List   Diagnosis Date Noted  . Unsteady gait 07/11/2017  . Recurrent falls 07/11/2017  . Anemia of chronic disease 03/24/2017  . History of colonic polyps 02/10/2017  . Dysphagia 11/05/2016  . IBS (irritable bowel syndrome) 02/04/2016  . Personal history of noncompliance with medical treatment, presenting hazards to health 10/21/2015  . Postprandial epigastric pain   . Heat sensitivity 10/07/2014  . Cardiac murmur 01/17/2014  . Hyperuricemia 08/04/2013  . Seasonal allergies 03/10/2013  . Lipoma of back 12/22/2012  . GERD (gastroesophageal reflux disease) 08/22/2012  . Sleep apnea 06/02/2011  . CKD (chronic kidney disease) stage 4, GFR 15-29 ml/min (HCC) 01/13/2011  . BACK PAIN WITH RADICULOPATHY 04/16/2010  . FATIGUE 07/24/2009  . LIVER FUNCTION TESTS, ABNORMAL, HX OF 05/08/2009  . LUPUS ERYTHEMATOSUS, DISCOID 06/05/2008  . Uncontrolled diabetes mellitus with diabetic nephropathy, with long-term current use of insulin ([place]) 06/07/2007  . Mixed hyperlipidemia 06/07/2007  . Obesity 06/07/2007  . Malignant hypertension 06/07/2007        Geraldine Solar PT, DPT  [facility] [address] [place], [place], [address] Phone: [phone]   Fax:  [phone]  Name: Kathryn Beck MRN: [id number] Date of Birth: 04/28/1955

## 2017-08-04 ENCOUNTER — Encounter (HOSPITAL_COMMUNITY): Payer: Self-pay

## 2017-08-04 ENCOUNTER — Telehealth (HOSPITAL_COMMUNITY): Payer: Self-pay | Admitting: Family Medicine

## 2017-08-04 ENCOUNTER — Ambulatory Visit (HOSPITAL_COMMUNITY): Payer: 59

## 2017-08-04 DIAGNOSIS — Z9181 History of falling: Secondary | ICD-10-CM

## 2017-08-04 DIAGNOSIS — R262 Difficulty in walking, not elsewhere classified: Secondary | ICD-10-CM

## 2017-08-04 DIAGNOSIS — M6281 Muscle weakness (generalized): Secondary | ICD-10-CM | POA: Diagnosis not present

## 2017-08-04 NOTE — Telephone Encounter (Signed)
08/04/17  pt cx because she said she had another appt tomorrow 5/16

## 2017-08-04 NOTE — Therapy (Signed)
[place], [place], [address] Phone: [phone]   Fax:  [phone]  Physical Therapy Treatment  Patient Details  Name: Kathryn Beck MRN: [id number] Date of Birth: 12/07/1955 Referring Provider: Fayrene Helper, MD   Encounter Date: 08/04/2017  PT End of Session - 08/04/17 1516    Visit Number  2    Number of Visits  13    Date for PT Re-Evaluation  09/08/17 [place] 08/18/17    Authorization Type  [employer]    Authorization Time Period  07/28/17 to 09/08/16    Authorization - Visit Number  2    Authorization - Number of Visits  60    PT Start Time  1440 pt late for apt    PT Stop Time  1518    PT Time Calculation (min)  38 min    Activity Tolerance  Patient limited by fatigue;Patient tolerated treatment well    Behavior During Therapy  [facility] for tasks assessed/performed       Past Medical History:  Diagnosis Date  . Acid reflux   . Anemia   . Arthritis   . Axillary masses    Soft tissue - status post excision  . Back pain   . Chronic kidney disease   . Essential hypertension   . Ischemic heart disease    Abnormal Myoview April 2018 - medical therapy  . Mixed hyperlipidemia   . Obesity   . Pancreatitis   . Sleep apnea    Noncompliant with CPAP  . Type 2 diabetes mellitus, uncontrolled ([place])     Past Surgical History:  Procedure Laterality Date  . ABDOMINAL HYSTERECTOMY    . COLONOSCOPY  2008   Dr. Oneida Alar: normal   . COLONOSCOPY N/A 12/18/2016   Dr. Oneida Alar: multiple tubular adenomas, internal hemorrhoids. Surveillance in 3 years   . ESOPHAGEAL DILATION N/A 10/13/2015   Procedure: ESOPHAGEAL DILATION;  Surgeon: Rogene Houston, MD;  Location: [facility];  Service: Endoscopy;  Laterality: N/A;  . ESOPHAGOGASTRODUODENOSCOPY N/A 10/13/2015   Dr. Laural Golden: chronic gastritis on path, no H.pylori. Empiric dilation   . ESOPHAGOGASTRODUODENOSCOPY N/A 12/18/2016   Dr. Oneida Alar: mild gastritis.  BRAVO study revealed uncontrolled GERD. Dysphagia secondary to uncontrolled reflux  . FOOT SURGERY Bilateral   . LUNG BIOPSY    . MASS EXCISION Right 01/09/2013   Procedure: EXCISION OF NEOPLASM OF RIGHT  AXILLA  AND EXCISION OF NEOPLASM OF LEFT AXILLA;  Surgeon: Jamesetta So, MD;  Location: [facility];  Service: General;  Laterality: Right;  procedure end @ 08:23  . MYRINGOTOMY WITH TUBE PLACEMENT Bilateral 04/28/2017   Procedure: BILATERAL MYRINGOTOMY WITH TUBE PLACEMENT;  Surgeon: Leta Baptist, MD;  Location: [facility];  Service: ENT;  Laterality: Bilateral;  . SAVORY DILATION N/A 12/18/2016   Procedure: SAVORY DILATION;  Surgeon: Danie Binder, MD;  Location: [facility];  Service: Endoscopy;  Laterality: N/A;    There were no vitals filed for this visit.  Subjective Assessment - 08/04/17 1443    Subjective  Pt stated her legs have increased pain and swelling today, currently taking fluid pills.  Current pain scale 4/10 bil LE.    Patient Stated Goals  legs not bother me and walk better    Currently in Pain?  Yes    Pain Score  4     Pain Location  Leg    Pain Orientation  Right;Left    Pain Descriptors / Indicators  Aching    Pain Type  Chronic pain    Pain Onset  More than a month ago    Pain Frequency  Constant    Aggravating Factors   unsure    Pain Relieving Factors  fluid pills    Effect of Pain on Daily Activities  slows down         [facility] PT Assessment - 08/04/17 0001      Assessment   Medical Diagnosis  unsteady gait, recurrent falls    Referring Provider  Fayrene Helper, MD    Onset Date/Surgical Date  01/28/17    Next MD Visit  June 2019    Prior Therapy  years ago for ankles      Precautions   Precautions  Fall      Observation/Other Assessments   Focus on Therapeutic Outcomes (FOTO)   60% limitation      Ambulation/Gait   Ambulation/Gait  Yes    Ambulation/Gait Assistance  5: Supervision    Ambulation/Gait Assistance Details  678 6 min walk test, no AD no  LOB episodes      6 Minute Walk- Baseline   6 Minute Walk- Baseline  yes    HR (bpm)  88    02 Sat (%RA)  98 %                   [facility] Adult PT Treatment/Exercise - 08/04/17 0001      Exercises   Exercises  Knee/Hip      Knee/Hip Exercises: Standing   Heel Raises  10 reps    Functional Squat  10 reps    Functional Squat Limitations  tactile/verbal cueing for proper form    Gait Training  6MWT 660ft, no AD    Other Standing Knee Exercises  marching 10x 5"      Knee/Hip Exercises: Seated   Sit to Sand  10 reps;without UE support      Knee/Hip Exercises: Supine   Bridges  2 sets;10 reps 3" holds             PT Education - 08/04/17 1524    Education provided  Yes    Education Details  Reviewed goals, educated importance of increased compliance with HEP and copy of eval given to pt.      Person(s) Educated  Patient    Methods  Explanation;Handout;Demonstration    Comprehension  Verbalized understanding;Returned demonstration       PT Short Term Goals - 07/28/17 1439      PT SHORT TERM GOAL #1   Title  Pt will be independent with HEP and perform consistently in order to improve strength and derease risk for falls.    Time  3    Period  Weeks    Status  New    Target Date  08/18/17      PT SHORT TERM GOAL #2   Title  Ptw will score 22/24 on the DGI to demo improved dynamic balance and decrease risk for falls during ambulation.    Time  3    Period  Weeks    Status  New      PT SHORT TERM GOAL #3   Title  Pt will be able to perform bil SLS for 5 sec wiht no UE to maximize gait and demo improved balance and functional strength.    Time  3    Period  Weeks    Status  New  PT SHORT TERM GOAL #4   Title  --        PT Long Term Goals - 07/28/17 1443      PT LONG TERM GOAL #1   Title  Pt will have improved MMT of proximal hip musculature to at least 4/5 throughout in order to maximize pt's gait and ability to ambulate stairs with greater  ease.    Time  6    Period  Weeks    Status  New    Target Date  09/08/17      PT LONG TERM GOAL #2   Title  Pt will be able to perform bil SLS for 10 sec or > with no UE to further demo improved balance maximize gait and decrease risk for falls.    Time  6    Period  Weeks    Status  New      PT LONG TERM GOAL #3   Title  Pt will be able to perform 13 or more STS during 30s chair rise test in order to demo improved functional strength and balance to decrease her risk for falls.    Time  6    Period  Weeks    Status  New      PT LONG TERM GOAL #4   Title  Pt will improve in the 6MWT by 116ft or > without requring a rest break in order to demo improved muscular endurance, tolerance to ambulation, and maximize her community access.     Time  6    Period  Weeks    Status  New            Plan - 08/04/17 1517    Clinical Impression Statement  Reviewed goals, assured compliance iwth HEP and copy of eval given to pt.  Pt educated on benefits of increasing compliance with HEP for maximal benefits.  6MWT complete in 630ft, pt c/o SOB and required close to a minute seated rest break before able to complete test.  Vitals checked upon s/s on SOB with O2sat at 98% and pulse at 88bmp.  FOTO complete this session with 60% limitations.  Session focus on LE functional strengthening incorporating some balance as well with SLS activities like marching, HHA required for safety.  EOS pt reports fatigue, no reoprts of increased pain through session.      Rehab Potential  Fair    PT Duration  6 weeks    PT Treatment/Interventions  ADLs/Self Care Home Management;Cryotherapy;Electrical Stimulation;Moist Heat;Traction;Ultrasound;DME Instruction;Gait training;Stair training;Functional mobility training;Therapeutic activities;Therapeutic exercise;Balance training;Neuromuscular re-education;Patient/family education;Manual techniques;Manual lymph drainage;Compression bandaging;Passive range of motion;Dry  needling;Energy conservation;Taping;Vestibular    PT Next Visit Plan  Continue to progress BLE, core, functinoal strengthening and balance training within pt's tolerance.  Add standing abduction and rockerboard next session.  Assess compliance iwth HEP.      PT Home Exercise Plan  eval: STS no UE, bridging       Patient will benefit from skilled therapeutic intervention in order to improve the following deficits and impairments:  Decreased activity tolerance, Decreased balance, Decreased endurance, Decreased mobility, Decreased strength, Difficulty walking, Hypomobility, Increased edema, Postural dysfunction, Improper body mechanics, Pain, Obesity  Visit Diagnosis: Muscle weakness (generalized)  History of falling  Difficulty in walking, not elsewhere classified     Problem List Patient Active Problem List   Diagnosis Date Noted  . Unsteady gait 07/11/2017  . Recurrent falls 07/11/2017  . Anemia of chronic disease 03/24/2017  . History of  colonic polyps 02/10/2017  . Dysphagia 11/05/2016  . IBS (irritable bowel syndrome) 02/04/2016  . Personal history of noncompliance with medical treatment, presenting hazards to health 10/21/2015  . Postprandial epigastric pain   . Heat sensitivity 10/07/2014  . Cardiac murmur 01/17/2014  . Hyperuricemia 08/04/2013  . Seasonal allergies 03/10/2013  . Lipoma of back 12/22/2012  . GERD (gastroesophageal reflux disease) 08/22/2012  . Sleep apnea 06/02/2011  . CKD (chronic kidney disease) stage 4, GFR 15-29 ml/min ([facility]) 01/13/2011  . BACK PAIN WITH RADICULOPATHY 04/16/2010  . FATIGUE 07/24/2009  . LIVER FUNCTION TESTS, ABNORMAL, HX OF 05/08/2009  . LUPUS ERYTHEMATOSUS, DISCOID 06/05/2008  . Uncontrolled diabetes mellitus with diabetic nephropathy, with long-term current use of insulin ([place]) 06/07/2007  . Mixed hyperlipidemia 06/07/2007  . Obesity 06/07/2007  . Malignant hypertension 06/07/2007   Ihor Austin, [place];  [place]  Aldona Lento 08/04/2017, 3:26 PM  [place] [name] [place], [place], [address] Phone: [phone]   Fax:  [phone]  Name: Kathryn Beck MRN: [id number] Date of Birth: 1955-08-20

## 2017-08-05 ENCOUNTER — Encounter (HOSPITAL_COMMUNITY): Payer: Self-pay

## 2017-08-09 ENCOUNTER — Ambulatory Visit: Payer: Self-pay | Admitting: Family Medicine

## 2017-08-11 ENCOUNTER — Ambulatory Visit (HOSPITAL_COMMUNITY): Payer: 59

## 2017-08-11 ENCOUNTER — Telehealth (HOSPITAL_COMMUNITY): Payer: Self-pay

## 2017-08-11 NOTE — Telephone Encounter (Signed)
I called the patient mobile phone number and left a message letting her know she had missed her appointment for today at 1:45 PM. I reminded her that her next appointment is this Friday at 4:45 PM and asked that she call our front office at [phone] if she cannot make the appointment.  Kipp Brood, PT, DPT Physical Therapist with [facility]  08/11/2017 2:12 PM

## 2017-08-13 ENCOUNTER — Telehealth (HOSPITAL_COMMUNITY): Payer: Self-pay | Admitting: Family Medicine

## 2017-08-13 ENCOUNTER — Ambulatory Visit (HOSPITAL_COMMUNITY): Payer: 59

## 2017-08-13 NOTE — Telephone Encounter (Signed)
08/13/17  pt left a message and said that she was still sick and can't come today

## 2017-08-15 ENCOUNTER — Emergency Department (HOSPITAL_COMMUNITY): Payer: 59

## 2017-08-15 ENCOUNTER — Emergency Department (HOSPITAL_COMMUNITY)
Admission: EM | Admit: 2017-08-15 | Discharge: 2017-08-15 | Disposition: A | Discharge status: Home / Self Care | Payer: 59 | Source: Home / Self Care | Attending: Emergency Medicine | Admitting: Emergency Medicine

## 2017-08-15 ENCOUNTER — Encounter (HOSPITAL_COMMUNITY): Payer: Self-pay | Admitting: Cardiology

## 2017-08-15 DIAGNOSIS — E1140 Type 2 diabetes mellitus with diabetic neuropathy, unspecified: Secondary | ICD-10-CM | POA: Diagnosis not present

## 2017-08-15 DIAGNOSIS — J189 Pneumonia, unspecified organism: Secondary | ICD-10-CM | POA: Diagnosis not present

## 2017-08-15 DIAGNOSIS — I129 Hypertensive chronic kidney disease with stage 1 through stage 4 chronic kidney disease, or unspecified chronic kidney disease: Secondary | ICD-10-CM | POA: Insufficient documentation

## 2017-08-15 DIAGNOSIS — R609 Edema, unspecified: Secondary | ICD-10-CM | POA: Insufficient documentation

## 2017-08-15 DIAGNOSIS — K219 Gastro-esophageal reflux disease without esophagitis: Secondary | ICD-10-CM | POA: Diagnosis not present

## 2017-08-15 DIAGNOSIS — J181 Lobar pneumonia, unspecified organism: Secondary | ICD-10-CM | POA: Insufficient documentation

## 2017-08-15 DIAGNOSIS — Z79899 Other long term (current) drug therapy: Secondary | ICD-10-CM

## 2017-08-15 DIAGNOSIS — G4733 Obstructive sleep apnea (adult) (pediatric): Secondary | ICD-10-CM | POA: Diagnosis not present

## 2017-08-15 DIAGNOSIS — Z794 Long term (current) use of insulin: Secondary | ICD-10-CM | POA: Insufficient documentation

## 2017-08-15 DIAGNOSIS — R0602 Shortness of breath: Secondary | ICD-10-CM | POA: Diagnosis present

## 2017-08-15 DIAGNOSIS — E669 Obesity, unspecified: Secondary | ICD-10-CM | POA: Diagnosis not present

## 2017-08-15 DIAGNOSIS — E1165 Type 2 diabetes mellitus with hyperglycemia: Secondary | ICD-10-CM

## 2017-08-15 DIAGNOSIS — N184 Chronic kidney disease, stage 4 (severe): Secondary | ICD-10-CM | POA: Insufficient documentation

## 2017-08-15 DIAGNOSIS — E872 Acidosis: Secondary | ICD-10-CM | POA: Diagnosis not present

## 2017-08-15 DIAGNOSIS — Z6835 Body mass index (BMI) 35.0-35.9, adult: Secondary | ICD-10-CM | POA: Diagnosis not present

## 2017-08-15 DIAGNOSIS — E1122 Type 2 diabetes mellitus with diabetic chronic kidney disease: Secondary | ICD-10-CM | POA: Diagnosis not present

## 2017-08-15 DIAGNOSIS — I10 Essential (primary) hypertension: Secondary | ICD-10-CM

## 2017-08-15 DIAGNOSIS — N179 Acute kidney failure, unspecified: Secondary | ICD-10-CM | POA: Diagnosis not present

## 2017-08-15 DIAGNOSIS — R739 Hyperglycemia, unspecified: Secondary | ICD-10-CM

## 2017-08-15 DIAGNOSIS — Z7951 Long term (current) use of inhaled steroids: Secondary | ICD-10-CM | POA: Diagnosis not present

## 2017-08-15 DIAGNOSIS — E782 Mixed hyperlipidemia: Secondary | ICD-10-CM | POA: Diagnosis not present

## 2017-08-15 DIAGNOSIS — N183 Chronic kidney disease, stage 3 (moderate): Secondary | ICD-10-CM | POA: Diagnosis not present

## 2017-08-15 LAB — CBC WITH DIFFERENTIAL/PLATELET
BASOS PCT: 0 %
Basophils Absolute: 0 10*3/uL (ref 0.0–0.1)
Eosinophils Absolute: 0.1 10*3/uL (ref 0.0–0.7)
Eosinophils Relative: 1 %
HCT: 31.7 % — ABNORMAL LOW (ref 36.0–46.0)
Hemoglobin: 9.5 g/dL — ABNORMAL LOW (ref 12.0–15.0)
Lymphocytes Relative: 27 %
Lymphs Abs: 2.5 10*3/uL (ref 0.7–4.0)
MCH: 22 pg — AB (ref 26.0–34.0)
MCHC: 30 g/dL (ref 30.0–36.0)
MCV: 73.5 fL — ABNORMAL LOW (ref 78.0–100.0)
MONO ABS: 0.6 10*3/uL (ref 0.1–1.0)
MONOS PCT: 6 %
NEUTROS ABS: 6.2 10*3/uL (ref 1.7–7.7)
Neutrophils Relative %: 66 %
Platelets: 492 10*3/uL — ABNORMAL HIGH (ref 150–400)
RBC: 4.31 MIL/uL (ref 3.87–5.11)
RDW: 15.5 % (ref 11.5–15.5)
WBC: 9.4 10*3/uL (ref 4.0–10.5)

## 2017-08-15 LAB — COMPREHENSIVE METABOLIC PANEL
ALT: 23 U/L (ref 14–54)
ANION GAP: 10 (ref 5–15)
AST: 17 U/L (ref 15–41)
Albumin: 2.5 g/dL — ABNORMAL LOW (ref 3.5–5.0)
Alkaline Phosphatase: 98 U/L (ref 38–126)
BUN: 28 mg/dL — ABNORMAL HIGH (ref 6–20)
CHLORIDE: 103 mmol/L (ref 101–111)
CO2: 25 mmol/L (ref 22–32)
Calcium: 8.5 mg/dL — ABNORMAL LOW (ref 8.9–10.3)
Creatinine, Ser: 2.8 mg/dL — ABNORMAL HIGH (ref 0.44–1.00)
GFR, EST AFRICAN AMERICAN: 20 mL/min — AB (ref >60)
GFR, EST NON AFRICAN AMERICAN: 17 mL/min — AB (ref >60)
Glucose, Bld: 445 mg/dL — ABNORMAL HIGH (ref 65–99)
Potassium: 3.9 mmol/L (ref 3.5–5.1)
Sodium: 138 mmol/L (ref 135–145)
Total Bilirubin: 0.6 mg/dL (ref 0.3–1.2)
Total Protein: 6.6 g/dL (ref 6.5–8.1)

## 2017-08-15 LAB — CBG MONITORING, ED: GLUCOSE-CAPILLARY: 439 mg/dL — AB (ref 65–99)

## 2017-08-15 LAB — BRAIN NATRIURETIC PEPTIDE: B NATRIURETIC PEPTIDE 5: 198 pg/mL — AB (ref 0.0–100.0)

## 2017-08-15 MED ORDER — INSULIN ASPART 100 UNIT/ML ~~LOC~~ SOLN
10.0000 [IU] | Freq: Once | SUBCUTANEOUS | Status: AC
  Administered 2017-08-15: 10 [IU] via SUBCUTANEOUS
  Filled 2017-08-15: qty 1

## 2017-08-15 MED ORDER — LEVOFLOXACIN 500 MG PO TABS
500.0000 mg | ORAL_TABLET | Freq: Once | ORAL | Status: AC
  Administered 2017-08-15: 500 mg via ORAL
  Filled 2017-08-15: qty 1

## 2017-08-15 MED ORDER — LABETALOL HCL 200 MG PO TABS
400.0000 mg | ORAL_TABLET | Freq: Two times a day (BID) | ORAL | Status: DC
  Administered 2017-08-15: 400 mg via ORAL
  Filled 2017-08-15: qty 2

## 2017-08-15 MED ORDER — AMLODIPINE BESYLATE 5 MG PO TABS
10.0000 mg | ORAL_TABLET | Freq: Every day | ORAL | Status: DC
  Administered 2017-08-15: 10 mg via ORAL
  Filled 2017-08-15: qty 2

## 2017-08-15 MED ORDER — ALBUTEROL (5 MG/ML) CONTINUOUS INHALATION SOLN
10.0000 mg/h | INHALATION_SOLUTION | RESPIRATORY_TRACT | Status: AC
  Administered 2017-08-15: 10 mg/h via RESPIRATORY_TRACT
  Filled 2017-08-15: qty 20

## 2017-08-15 MED ORDER — PREDNISONE 20 MG PO TABS
40.0000 mg | ORAL_TABLET | Freq: Once | ORAL | Status: AC
  Administered 2017-08-15: 40 mg via ORAL
  Filled 2017-08-15: qty 2

## 2017-08-15 MED ORDER — IPRATROPIUM-ALBUTEROL 0.5-2.5 (3) MG/3ML IN SOLN
RESPIRATORY_TRACT | Status: AC
  Administered 2017-08-15: 3 mL
  Filled 2017-08-15: qty 3

## 2017-08-15 MED ORDER — CLONIDINE HCL 0.2 MG PO TABS
0.2000 mg | ORAL_TABLET | ORAL | Status: AC
  Administered 2017-08-15: 0.2 mg via ORAL
  Filled 2017-08-15: qty 1

## 2017-08-15 MED ORDER — BENZONATATE 100 MG PO CAPS: 100.0000 mg | ORAL_CAPSULE | Freq: Three times a day (TID) | ORAL | 0 refills | Status: DC

## 2017-08-15 MED ORDER — IPRATROPIUM-ALBUTEROL 0.5-2.5 (3) MG/3ML IN SOLN: 3.0000 mL | Freq: Once | RESPIRATORY_TRACT | Status: DC

## 2017-08-15 MED ORDER — BENZONATATE 100 MG PO CAPS
200.0000 mg | ORAL_CAPSULE | Freq: Once | ORAL | Status: AC
  Administered 2017-08-15: 200 mg via ORAL
  Filled 2017-08-15: qty 2

## 2017-08-15 MED ORDER — LEVOFLOXACIN 500 MG PO TABS: 500.0000 mg | ORAL_TABLET | Freq: Every day | ORAL | 0 refills | Status: DC

## 2017-08-15 MED ORDER — ALBUTEROL SULFATE HFA 108 (90 BASE) MCG/ACT IN AERS: 2.0000 | INHALATION_SPRAY | RESPIRATORY_TRACT | 3 refills | Status: DC | PRN

## 2017-08-15 NOTE — Discharge Instructions (Signed)
Your testing today shows that you have a pneumonia, please take Levaquin daily for 7 days Tessalon 100 mg every 8 hours as needed for coughing Albuterol inhaler 2 puffs every 4 hours as needed for wheezing or coughing Your blood sugar was elevated over 400, please make sure that you are eating a healthy low sugar diabetic diet and taking her medications exactly as prescribed Your blood pressure was also elevated but she did not take her medications this morning, please make sure that you take them exactly as prescribed starting with your next dose of medications this evening as we gave you your daily medicines here Emergency department for severe or worsening symptoms including difficulty breathing coughing wheezing fevers chest pain or any other severe or worsening symptoms

## 2017-08-15 NOTE — ED Provider Notes (Signed)
[facility] EMERGENCY DEPARTMENT Provider Note   CSN: [id number] Arrival date & time: 08/15/17  5053     History   Chief Complaint Chief Complaint  Patient presents with  . Shortness of Breath    HPI Kathryn Beck is a 62 y.o. female.  HPI  The patient is a 62 year old female, she has a known history of type 2 diabetes, high blood pressure, she denies any prior history of tobacco use COPD or asthma.  She has had several days of worsening shortness of breath with coughing and wheezing, she has had productive phlegm, she denies any increased swelling of her legs.  Her shortness of breath has been persistent, the coughing is persistent, it is become severe.  She has not had any medications at home to treat this.    She reports that she was told that she may have some ischemic heart disease and had an abnormal stress test in April 2018 with medical therapy recommended at that time.  Review of the medical record shows that the patient was seen by the cardiology service on Aug 18, 2016 and at that time she reported that she was following with Dr. Lowanda Foster for her kidneys, was taking Norvasc and clonidine Aldactone and Diovan.  The Leane Call was reported on July 06, 2016 to have no ST segment deviation, there was some findings that was consistent with a prior moderate anterior myocardial infarction but a normal left ventricular ejection fraction.  They were hesitant to pursue cardiac catheterization due to the risk of contrast nephropathy.  The patient was not having any chest pain at that time.  Creatinine was around 2.5 at that time.  Medical therapy was recommended and controlling blood pressure, hyperlipidemia and was told to wear compression stockings at that time.  She has not followed up since that time with cardiology based on the medical record.  Past Medical History:  Diagnosis Date  . Acid reflux   . Anemia   . Arthritis   . Axillary masses    Soft tissue - status post  excision  . Back pain   . Chronic kidney disease   . Essential hypertension   . Ischemic heart disease    Abnormal Myoview April 2018 - medical therapy  . Mixed hyperlipidemia   . Obesity   . Pancreatitis   . Sleep apnea    Noncompliant with CPAP  . Type 2 diabetes mellitus, uncontrolled ([place])     Patient Active Problem List   Diagnosis Date Noted  . Unsteady gait 07/11/2017  . Recurrent falls 07/11/2017  . Anemia of chronic disease 03/24/2017  . History of colonic polyps 02/10/2017  . Dysphagia 11/05/2016  . IBS (irritable bowel syndrome) 02/04/2016  . Personal history of noncompliance with medical treatment, presenting hazards to health 10/21/2015  . Postprandial epigastric pain   . Heat sensitivity 10/07/2014  . Cardiac murmur 01/17/2014  . Hyperuricemia 08/04/2013  . Seasonal allergies 03/10/2013  . Lipoma of back 12/22/2012  . GERD (gastroesophageal reflux disease) 08/22/2012  . Sleep apnea 06/02/2011  . CKD (chronic kidney disease) stage 4, GFR 15-29 ml/min (HCC) 01/13/2011  . BACK PAIN WITH RADICULOPATHY 04/16/2010  . FATIGUE 07/24/2009  . LIVER FUNCTION TESTS, ABNORMAL, HX OF 05/08/2009  . LUPUS ERYTHEMATOSUS, DISCOID 06/05/2008  . Uncontrolled diabetes mellitus with diabetic nephropathy, with long-term current use of insulin ([place]) 06/07/2007  . Mixed hyperlipidemia 06/07/2007  . Obesity 06/07/2007  . Malignant hypertension 06/07/2007    Past Surgical History:  Procedure  Laterality Date  . ABDOMINAL HYSTERECTOMY    . COLONOSCOPY  2008   Dr. Oneida Alar: normal   . COLONOSCOPY N/A 12/18/2016   Dr. Oneida Alar: multiple tubular adenomas, internal hemorrhoids. Surveillance in 3 years   . ESOPHAGEAL DILATION N/A 10/13/2015   Procedure: ESOPHAGEAL DILATION;  Surgeon: Rogene Houston, MD;  Location: [facility] END[facility] SUITE;  Service: Endoscopy;  Laterality: N/A;  . ESOPHAGOGASTRODUODENOSCOPY N/A 10/13/2015   Dr. Laural Golden: chronic gastritis on path, no H.pylori. Empiric dilation   .  ESOPHAGOGASTRODUODENOSCOPY N/A 12/18/2016   Dr. Oneida Alar: mild gastritis. BRAVO study revealed uncontrolled GERD. Dysphagia secondary to uncontrolled reflux  . FOOT SURGERY Bilateral   . LUNG BIOPSY    . MASS EXCISION Right 01/09/2013   Procedure: EXCISION OF NEOPLASM OF RIGHT  AXILLA  AND EXCISION OF NEOPLASM OF LEFT AXILLA;  Surgeon: Jamesetta So, MD;  Location: [facility] ORS;  Service: General;  Laterality: Right;  procedure end @ 08:23  . MYRINGOTOMY WITH TUBE PLACEMENT Bilateral 04/28/2017   Procedure: BILATERAL MYRINGOTOMY WITH TUBE PLACEMENT;  Surgeon: Leta Baptist, MD;  Location: [place];  Service: ENT;  Laterality: Bilateral;  . SAVORY DILATION N/A 12/18/2016   Procedure: SAVORY DILATION;  Surgeon: Danie Binder, MD;  Location: [facility];  Service: Endoscopy;  Laterality: N/A;     OB History   None      Home Medications    Prior to Admission medications   Medication Sig Start Date End Date Taking? Authorizing Provider  acetaminophen (TYLENOL) 500 MG tablet Take 1,000 mg by mouth every 6 (six) hours as needed for moderate pain or headache.   Yes [provider]  amLODipine (NORVASC) 10 MG tablet Take 1 tablet (10 mg total) by mouth daily. 05/31/17  Yes Fayrene Helper, MD  atorvastatin (LIPITOR) 20 MG tablet Take 1 tablet (20 mg total) by mouth daily. 05/31/17  Yes Fayrene Helper, MD  azelastine (ASTELIN) 0.1 % nasal spray Place 2 sprays into both nostrils 2 (two) times daily. Use in each nostril as directed 05/31/17  Yes Fayrene Helper, MD  calcitRIOL (ROCALTROL) 0.25 MCG capsule Take 0.25 mcg by mouth every Monday, Wednesday, and Friday.  09/15/16  Yes Fayrene Helper, MD  cloNIDine (CATAPRES) 0.1 MG tablet TAKE 1 TABLET BY MOUTH  EVERY EVENING AT 9PM FOR  BLOOD PRESSURE 05/31/17  Yes Fayrene Helper, MD  dexlansoprazole (DEXILANT) 60 MG capsule 1 PO EVERY MORNING WITH BREAKFAST. 04/15/17  Yes Fields, Marga Melnick, MD  dicyclomine (BENTYL) 10 MG capsule 1 po qachs if  needed to prevent diarrhea and abdominal cramps 12/18/16  Yes Fields, Marga Melnick, MD  ezetimibe (ZETIA) 10 MG tablet Take 1 tablet (10 mg total) by mouth daily. 05/31/17  Yes Fayrene Helper, MD  furosemide (LASIX) 40 MG tablet One tablet two times daily, as needed, for leg swelling 07/11/17  Yes Fayrene Helper, MD  gabapentin (NEURONTIN) 100 MG capsule Take 1 capsule (100 mg total) by mouth 3 (three) times daily. 07/05/17  Yes Edrick Kins, DPM  HYDROcodone-acetaminophen (NORCO/VICODIN) 5-325 MG tablet 1/2 OR 1 EVERY 6 H PRN FOR PAIN 06/09/17  Yes Fields, Marga Melnick, MD  Insulin Detemir (LEVEMIR) 100 UNIT/ML Pen Inject 100 Units into the skin daily at 10 pm. 03/19/17  Yes [name], MD  Insulin Lispro (HUMALOG KWIKPEN) 200 UNIT/ML SOPN Inject 32-36 Units into the skin 3 (three) times daily before meals. 06/18/17  Yes [name], MD  isosorbide mononitrate (IMDUR) 30 MG  24 hr tablet Take 1 tablet (30 mg total) by mouth daily. 05/31/17  Yes Fayrene Helper, MD  labetalol (NORMODYNE) 200 MG tablet Take 2 tablets (400 mg total) by mouth 2 (two) times daily. 05/31/17  Yes Fayrene Helper, MD  olmesartan (BENICAR) 40 MG tablet Take 1 tablet (40 mg total) by mouth daily. 07/16/17  Yes Fayrene Helper, MD  Vitamin D, Ergocalciferol, (DRISDOL) 50000 units CAPS capsule Take 1 capsule (50,000 Units total) by mouth every 7 (seven) days. 07/08/17  Yes Fayrene Helper, MD  albuterol (PROVENTIL HFA;VENTOLIN HFA) 108 (90 Base) MCG/ACT inhaler Inhale 2 puffs into the lungs every 4 (four) hours as needed for wheezing or shortness of breath. 08/15/17   Noemi Chapel, MD  benzonatate (TESSALON) 100 MG capsule Take 1 capsule (100 mg total) by mouth every 8 (eight) hours. 08/15/17   Noemi Chapel, MD  levofloxacin (LEVAQUIN) 500 MG tablet Take 1 tablet (500 mg total) by mouth daily. 08/15/17   Noemi Chapel, MD  FLUoxetine (PROZAC) 10 MG capsule Take 10 mg by mouth daily.    05/28/11  [provider]  glipiZIDE (GLUCOTROL) 10 MG tablet Take 10 mg by mouth 2 (two) times daily before a meal.    05/28/11  [provider]    Family History Family History  Problem Relation Age of Onset  . Hypertension Father   . Hypercholesterolemia Father   . Arthritis Father   . Hypertension Sister   . Hypercholesterolemia Sister   . Breast cancer Sister   . Hypertension Sister   . Colon cancer Neg Hx   . Colon polyps Neg Hx     Social History Social History   Tobacco Use  . Smoking status: Never Smoker  . Smokeless tobacco: Never Used  Substance Use Topics  . Alcohol use: No  . Drug use: No     Allergies   Ace inhibitors; Penicillins; and Statins   Review of Systems Review of Systems  All other systems reviewed and are negative.    Physical Exam Updated Vital Signs BP (!) 202/96 (BP Location: Right Arm)   Pulse (!) 118   Temp 98.5 F (36.9 C) (Oral)   Resp 19   Ht 5\' 4"  (1.626 m)   Wt 94.3 kg (208 lb)   SpO2 95%   BMI 35.70 kg/m   Physical Exam  Constitutional: She appears well-developed and well-nourished. No distress.  HENT:  Head: Normocephalic and atraumatic.  Mouth/Throat: Oropharynx is clear and moist. No oropharyngeal exudate.  Eyes: Pupils are equal, round, and reactive to light. Conjunctivae and EOM are normal. Right eye exhibits no discharge. Left eye exhibits no discharge. No scleral icterus.  Neck: Normal range of motion. Neck supple. No JVD present. No thyromegaly present.  Cardiovascular: Normal rate, regular rhythm, normal heart sounds and intact distal pulses. Exam reveals no gallop and no friction rub.  No murmur heard. Pulmonary/Chest: Effort normal. No respiratory distress. She has wheezes. She has rales.  The patient is able to speak in full sentences, she does not appear to be in respiratory distress.  She does have diffuse expiratory wheezing with some rales at the bases left greater than right.  There is a slight prolonged  expiratory phase.  She coughs occasionally.  She does have some productive phlegm.  Abdominal: Soft. Bowel sounds are normal. She exhibits no distension and no mass. There is no tenderness.  Musculoskeletal: Normal range of motion. She exhibits edema ( Symmetrical bilateral lower extremity  edema, mild). She exhibits no tenderness.  Lymphadenopathy:    She has no cervical adenopathy.  Neurological: She is alert. Coordination normal.  Skin: Skin is warm and dry. No rash noted. No erythema.  Psychiatric: She has a normal mood and affect. Her behavior is normal.  Nursing note and vitals reviewed.    ED Treatments / Results  Labs (all labs ordered are listed, but only abnormal results are displayed) Labs Reviewed  CBC WITH DIFFERENTIAL/PLATELET - Abnormal; Notable for the following components:      Result Value   Hemoglobin 9.5 (*)    HCT 31.7 (*)    MCV 73.5 (*)    MCH 22.0 (*)    Platelets 492 (*)    All other components within normal limits  BRAIN NATRIURETIC PEPTIDE - Abnormal; Notable for the following components:   B Natriuretic Peptide 198.0 (*)    All other components within normal limits  COMPREHENSIVE METABOLIC PANEL - Abnormal; Notable for the following components:   Glucose, Bld 445 (*)    BUN 28 (*)    Creatinine, Ser 2.80 (*)    Calcium 8.5 (*)    Albumin 2.5 (*)    GFR calc non Af Amer 17 (*)    GFR calc Af Amer 20 (*)    All other components within normal limits  CBG MONITORING, ED - Abnormal; Notable for the following components:   Glucose-Capillary 439 (*)    All other components within normal limits    EKG EKG Interpretation  Date/Time:  Sunday Aug 15 2017 09:37:01 EDT Ventricular Rate:  78 PR Interval:  144 QRS Duration: 76 QT Interval:  400 QTC Calculation: 456 R Axis:   44 Text Interpretation:  Normal sinus rhythm Nonspecific T wave abnormality Abnormal ECG since last tracing no significant change Confirmed by Noemi Chapel [phone]) on 08/15/2017  9:54:21 AM   Radiology Dg Chest 2 View  Result Date: 08/15/2017 CLINICAL DATA:  Cough, wheezing, SOB today EXAM: CHEST - 2 VIEW COMPARISON:  03/24/2017 FINDINGS: Ill-defined airspace opacities in the left mid lung and both lung bases left greater than right, increased since prior study. Heart size upper limits normal. No effusion. Visualized bones unremarkable. IMPRESSION: 1. Patchy bibasilar airspace opacities left greater than right, possibly early pneumonia. Consider follow-up to confirm appropriate resolution. Electronically Signed   By: Lucrezia Europe M.D.   On: 08/15/2017 10:42    Procedures Procedures (including critical care time)  Medications Ordered in ED Medications  ipratropium-albuterol (DUONEB) 0.5-2.5 (3) MG/3ML nebulizer solution 3 mL (3 mLs Nebulization Not Given 08/15/17 0943)  albuterol (PROVENTIL,VENTOLIN) solution continuous neb (0 mg/hr Nebulization Stopped 08/15/17 1223)  amLODipine (NORVASC) tablet 10 mg (10 mg Oral Given 08/15/17 1218)  labetalol (NORMODYNE) tablet 400 mg (400 mg Oral Given 08/15/17 1218)  ipratropium-albuterol (DUONEB) 0.5-2.5 (3) MG/3ML nebulizer solution (3 mLs  Given 08/15/17 0943)  predniSONE (DELTASONE) tablet 40 mg (40 mg Oral Given 08/15/17 1048)  benzonatate (TESSALON) capsule 200 mg (200 mg Oral Given 08/15/17 1048)  levofloxacin (LEVAQUIN) tablet 500 mg (500 mg Oral Given 08/15/17 1217)  cloNIDine (CATAPRES) tablet 0.2 mg (0.2 mg Oral Given 08/15/17 1218)  insulin aspart (novoLOG) injection 10 Units (10 Units Subcutaneous Given 08/15/17 1232)     Initial Impression / Assessment and Plan / ED Course  I have reviewed the triage vital signs and the nursing notes.  Pertinent labs & imaging results that were available during my care of the patient were reviewed by me and considered in  my medical decision making (see chart for details).  Clinical Course as of Aug 16 1331  Sun Aug 15, 2017  1225 CBG is elevated - > 400, she has no AG acidosis -  insulin ordered   [BM]    Clinical Course User Index [BM] Noemi Chapel, MD   Of particular concern is the patient's ongoing respiratory symptoms despite getting 2 nebulized treatments prior to my pulmonary evaluation.  She has some rales in the bases which could indicate a pneumonia.  We will add some lab work, her EKG is unchanged and nonischemic and she is not having any particular chest pain, just the tightness with her breathing.  Prednisone, albuterol, labs, more aggressive blood pressure control as she is currently significantly hypertensive which may be related to both not taking medications this morning as well as her increasing shortness of breath and bronchodilator therapy which would cause some hypertension.  The patient ambulated with oxygen dropping only to 96 to 97%.  She still has some mild expiratory wheezing but has significantly improved after nebulized treatments and antibiotics for what appears to be a pneumonia on the x-ray.  There is no leukocytosis, no significant change in renal dysfunction, in fact her blood work today is similar to baseline, creatinine is elevated at 2.8 which is at her baseline  The patient was given insulin, she was given her morning blood pressure medications due to her hypertension, she feels better after the interventions and is stable going home, she agrees with this plan  Final Clinical Impressions(s) / ED Diagnoses   Final diagnoses:  Community acquired pneumonia of left lower lobe of lung ([place])  Essential hypertension  Hyperglycemia    ED Discharge Orders        Ordered    levofloxacin (LEVAQUIN) 500 MG tablet  Daily     08/15/17 1331    albuterol (PROVENTIL HFA;VENTOLIN HFA) 108 (90 Base) MCG/ACT inhaler  Every 4 hours PRN     08/15/17 1331    benzonatate (TESSALON) 100 MG capsule  Every 8 hours     08/15/17 1331       Noemi Chapel, MD 08/15/17 1333

## 2017-08-15 NOTE — ED Triage Notes (Signed)
Coughing and sob times one week.

## 2017-08-15 NOTE — ED Notes (Signed)
Ambulated patient to bathroom with pulse ox. Pt's o2 stayed at 97% during ambulation

## 2017-08-17 ENCOUNTER — Telehealth: Payer: Self-pay | Admitting: Family Medicine

## 2017-08-17 NOTE — Telephone Encounter (Signed)
Still having trouble breathing, pt was diagnosis with pneumonia over the weekend [facility]) still having trouble breathing and would like to be seen, any suggestions on a time. --about 20 mins from office.  [phone]

## 2017-08-17 NOTE — Telephone Encounter (Signed)
June 3 or one day next week ,  is the soonest available, takes time for the antibiotic to work, BUT if she continues to have significant shortness of breath or feels worse,  she should return to the ED for re evaulation, please explain

## 2017-08-18 ENCOUNTER — Other Ambulatory Visit: Payer: Self-pay

## 2017-08-18 ENCOUNTER — Telehealth (HOSPITAL_COMMUNITY): Payer: Self-pay | Admitting: Family Medicine

## 2017-08-18 ENCOUNTER — Encounter (HOSPITAL_COMMUNITY): Payer: Self-pay | Admitting: Emergency Medicine

## 2017-08-18 ENCOUNTER — Emergency Department (HOSPITAL_COMMUNITY): Payer: 59

## 2017-08-18 ENCOUNTER — Observation Stay (HOSPITAL_COMMUNITY)
Admission: EM | Admit: 2017-08-18 | Discharge: 2017-08-20 | DRG: 194 | Disposition: A | Discharge status: Home / Self Care | Payer: 59 | Attending: Internal Medicine | Admitting: Internal Medicine

## 2017-08-18 ENCOUNTER — Ambulatory Visit (HOSPITAL_COMMUNITY): Payer: 59

## 2017-08-18 DIAGNOSIS — N183 Chronic kidney disease, stage 3 unspecified: Secondary | ICD-10-CM | POA: Diagnosis present

## 2017-08-18 DIAGNOSIS — E669 Obesity, unspecified: Secondary | ICD-10-CM | POA: Diagnosis present

## 2017-08-18 DIAGNOSIS — Z6835 Body mass index (BMI) 35.0-35.9, adult: Secondary | ICD-10-CM

## 2017-08-18 DIAGNOSIS — E782 Mixed hyperlipidemia: Secondary | ICD-10-CM | POA: Diagnosis not present

## 2017-08-18 DIAGNOSIS — E1121 Type 2 diabetes mellitus with diabetic nephropathy: Secondary | ICD-10-CM

## 2017-08-18 DIAGNOSIS — Z794 Long term (current) use of insulin: Secondary | ICD-10-CM

## 2017-08-18 DIAGNOSIS — R0602 Shortness of breath: Secondary | ICD-10-CM

## 2017-08-18 DIAGNOSIS — N179 Acute kidney failure, unspecified: Secondary | ICD-10-CM | POA: Diagnosis present

## 2017-08-18 DIAGNOSIS — E1165 Type 2 diabetes mellitus with hyperglycemia: Secondary | ICD-10-CM

## 2017-08-18 DIAGNOSIS — E872 Acidosis: Secondary | ICD-10-CM | POA: Diagnosis present

## 2017-08-18 DIAGNOSIS — R739 Hyperglycemia, unspecified: Secondary | ICD-10-CM

## 2017-08-18 DIAGNOSIS — G4733 Obstructive sleep apnea (adult) (pediatric): Secondary | ICD-10-CM | POA: Diagnosis present

## 2017-08-18 DIAGNOSIS — J181 Lobar pneumonia, unspecified organism: Secondary | ICD-10-CM | POA: Diagnosis not present

## 2017-08-18 DIAGNOSIS — Z79899 Other long term (current) drug therapy: Secondary | ICD-10-CM

## 2017-08-18 DIAGNOSIS — J302 Other seasonal allergic rhinitis: Secondary | ICD-10-CM | POA: Diagnosis present

## 2017-08-18 DIAGNOSIS — E1140 Type 2 diabetes mellitus with diabetic neuropathy, unspecified: Secondary | ICD-10-CM | POA: Diagnosis present

## 2017-08-18 DIAGNOSIS — K219 Gastro-esophageal reflux disease without esophagitis: Secondary | ICD-10-CM | POA: Diagnosis present

## 2017-08-18 DIAGNOSIS — Z7951 Long term (current) use of inhaled steroids: Secondary | ICD-10-CM

## 2017-08-18 DIAGNOSIS — G4730 Sleep apnea, unspecified: Secondary | ICD-10-CM | POA: Diagnosis present

## 2017-08-18 DIAGNOSIS — E1122 Type 2 diabetes mellitus with diabetic chronic kidney disease: Secondary | ICD-10-CM | POA: Diagnosis present

## 2017-08-18 DIAGNOSIS — IMO0002 Reserved for concepts with insufficient information to code with codable children: Secondary | ICD-10-CM | POA: Insufficient documentation

## 2017-08-18 DIAGNOSIS — I129 Hypertensive chronic kidney disease with stage 1 through stage 4 chronic kidney disease, or unspecified chronic kidney disease: Secondary | ICD-10-CM | POA: Diagnosis present

## 2017-08-18 DIAGNOSIS — J189 Pneumonia, unspecified organism: Principal | ICD-10-CM | POA: Diagnosis present

## 2017-08-18 DIAGNOSIS — E785 Hyperlipidemia, unspecified: Secondary | ICD-10-CM

## 2017-08-18 LAB — BASIC METABOLIC PANEL
ANION GAP: 10 (ref 5–15)
BUN: 39 mg/dL — ABNORMAL HIGH (ref 6–20)
CO2: 22 mmol/L (ref 22–32)
Calcium: 8.1 mg/dL — ABNORMAL LOW (ref 8.9–10.3)
Chloride: 106 mmol/L (ref 101–111)
Creatinine, Ser: 3.73 mg/dL — ABNORMAL HIGH (ref 0.44–1.00)
GFR calc Af Amer: 14 mL/min — ABNORMAL LOW (ref >60)
GFR, EST NON AFRICAN AMERICAN: 12 mL/min — AB (ref >60)
Glucose, Bld: 509 mg/dL (ref 65–99)
POTASSIUM: 4.1 mmol/L (ref 3.5–5.1)
SODIUM: 138 mmol/L (ref 135–145)

## 2017-08-18 LAB — CBC WITH DIFFERENTIAL/PLATELET
BASOS ABS: 0 10*3/uL (ref 0.0–0.1)
BASOS PCT: 0 %
EOS ABS: 0 10*3/uL (ref 0.0–0.7)
Eosinophils Relative: 0 %
HEMATOCRIT: 28.2 % — AB (ref 36.0–46.0)
Hemoglobin: 8.4 g/dL — ABNORMAL LOW (ref 12.0–15.0)
LYMPHS ABS: 1.4 10*3/uL (ref 0.7–4.0)
Lymphocytes Relative: 16 %
MCH: 21.8 pg — AB (ref 26.0–34.0)
MCHC: 29.8 g/dL — ABNORMAL LOW (ref 30.0–36.0)
MCV: 73.1 fL — ABNORMAL LOW (ref 78.0–100.0)
MONOS PCT: 5 %
Monocytes Absolute: 0.4 10*3/uL (ref 0.1–1.0)
NEUTROS ABS: 7.1 10*3/uL (ref 1.7–7.7)
Neutrophils Relative %: 79 %
Platelets: 469 10*3/uL — ABNORMAL HIGH (ref 150–400)
RBC: 3.86 MIL/uL — ABNORMAL LOW (ref 3.87–5.11)
RDW: 16 % — AB (ref 11.5–15.5)
WBC: 8.9 10*3/uL (ref 4.0–10.5)

## 2017-08-18 LAB — MAGNESIUM: Magnesium: 1.4 mg/dL — ABNORMAL LOW (ref 1.7–2.4)

## 2017-08-18 LAB — URINALYSIS, ROUTINE W REFLEX MICROSCOPIC
BILIRUBIN URINE: NEGATIVE
Glucose, UA: >=500 mg/dL — AB
Ketones, ur: NEGATIVE mg/dL
Leukocytes, UA: NEGATIVE
NITRITE: NEGATIVE
Protein, ur: >=300 mg/dL — AB
Specific Gravity, Urine: 1.018 (ref 1.005–1.030)
pH: 5 (ref 5.0–8.0)

## 2017-08-18 LAB — GLUCOSE, CAPILLARY
GLUCOSE-CAPILLARY: 215 mg/dL — AB (ref 65–99)
GLUCOSE-CAPILLARY: 209 mg/dL — AB (ref 65–99)

## 2017-08-18 LAB — HEMOGLOBIN A1C
Hgb A1c MFr Bld: 11 % — ABNORMAL HIGH (ref 4.8–5.6)
Mean Plasma Glucose: 269 mg/dL

## 2017-08-18 LAB — BRAIN NATRIURETIC PEPTIDE: B NATRIURETIC PEPTIDE 5: 77 pg/mL (ref 0.0–100.0)

## 2017-08-18 LAB — PHOSPHORUS: PHOSPHORUS: 3.8 mg/dL (ref 2.5–4.6)

## 2017-08-18 LAB — LACTIC ACID, PLASMA
LACTIC ACID, VENOUS: 2.7 mmol/L — AB (ref 0.5–1.9)
LACTIC ACID, VENOUS: 2.9 mmol/L — AB (ref 0.5–1.9)

## 2017-08-18 LAB — TROPONIN I: Troponin I: <0.03 ng/mL (ref <0.03)

## 2017-08-18 MED ORDER — ISOSORBIDE MONONITRATE ER 60 MG PO TB24
30.0000 mg | ORAL_TABLET | Freq: Every day | ORAL | Status: DC
  Administered 2017-08-19 – 2017-08-20 (×2): 30 mg via ORAL
  Filled 2017-08-18 (×2): qty 1

## 2017-08-18 MED ORDER — SODIUM CHLORIDE 0.9 % IV SOLN
INTRAVENOUS | Status: DC
  Administered 2017-08-18 – 2017-08-20 (×5): via INTRAVENOUS

## 2017-08-18 MED ORDER — ALBUTEROL SULFATE (2.5 MG/3ML) 0.083% IN NEBU
2.5000 mg | INHALATION_SOLUTION | Freq: Once | RESPIRATORY_TRACT | Status: AC
  Administered 2017-08-18: 2.5 mg via RESPIRATORY_TRACT
  Filled 2017-08-18: qty 3

## 2017-08-18 MED ORDER — LEVOFLOXACIN IN D5W 750 MG/150ML IV SOLN: 750.0000 mg | INTRAVENOUS | Status: DC

## 2017-08-18 MED ORDER — AZELASTINE HCL 0.1 % NA SOLN
2.0000 | Freq: Two times a day (BID) | NASAL | Status: DC
  Administered 2017-08-18 – 2017-08-20 (×4): 2 via NASAL
  Filled 2017-08-18 (×2): qty 30

## 2017-08-18 MED ORDER — DM-GUAIFENESIN ER 30-600 MG PO TB12
1.0000 | ORAL_TABLET | Freq: Two times a day (BID) | ORAL | Status: DC
  Administered 2017-08-18 – 2017-08-20 (×5): 1 via ORAL
  Filled 2017-08-18 (×5): qty 1

## 2017-08-18 MED ORDER — IPRATROPIUM BROMIDE 0.02 % IN SOLN: 0.5000 mg | Freq: Once | RESPIRATORY_TRACT | Status: DC

## 2017-08-18 MED ORDER — ALBUTEROL SULFATE (2.5 MG/3ML) 0.083% IN NEBU
2.5000 mg | INHALATION_SOLUTION | Freq: Once | RESPIRATORY_TRACT | Status: AC
Start: 2017-08-18 — End: 2017-08-18
  Administered 2017-08-18: 2.5 mg via RESPIRATORY_TRACT
  Filled 2017-08-18: qty 3

## 2017-08-18 MED ORDER — LEVOFLOXACIN IN D5W 750 MG/150ML IV SOLN
750.0000 mg | Freq: Once | INTRAVENOUS | Status: AC
  Administered 2017-08-18: 750 mg via INTRAVENOUS
  Filled 2017-08-18: qty 150

## 2017-08-18 MED ORDER — CLONIDINE HCL 0.1 MG PO TABS
0.1000 mg | ORAL_TABLET | ORAL | Status: DC
  Administered 2017-08-18 – 2017-08-19 (×2): 0.1 mg via ORAL
  Filled 2017-08-18 (×2): qty 1

## 2017-08-18 MED ORDER — IPRATROPIUM-ALBUTEROL 0.5-2.5 (3) MG/3ML IN SOLN
3.0000 mL | Freq: Four times a day (QID) | RESPIRATORY_TRACT | Status: DC
  Administered 2017-08-18 – 2017-08-20 (×8): 3 mL via RESPIRATORY_TRACT
  Filled 2017-08-18 (×8): qty 3

## 2017-08-18 MED ORDER — ACETAMINOPHEN 650 MG RE SUPP: 650.0000 mg | Freq: Four times a day (QID) | RECTAL | Status: DC | PRN

## 2017-08-18 MED ORDER — AMLODIPINE BESYLATE 5 MG PO TABS
10.0000 mg | ORAL_TABLET | Freq: Every day | ORAL | Status: DC
  Administered 2017-08-19 – 2017-08-20 (×2): 10 mg via ORAL
  Filled 2017-08-18 (×2): qty 2

## 2017-08-18 MED ORDER — BUDESONIDE 0.5 MG/2ML IN SUSP
0.5000 mg | Freq: Two times a day (BID) | RESPIRATORY_TRACT | Status: DC
  Administered 2017-08-18 – 2017-08-20 (×4): 0.5 mg via RESPIRATORY_TRACT
  Filled 2017-08-18 (×8): qty 2

## 2017-08-18 MED ORDER — ACETAMINOPHEN 325 MG PO TABS
650.0000 mg | ORAL_TABLET | Freq: Four times a day (QID) | ORAL | Status: DC | PRN
  Administered 2017-08-19 – 2017-08-20 (×3): 650 mg via ORAL
  Filled 2017-08-18 (×3): qty 2

## 2017-08-18 MED ORDER — DICYCLOMINE HCL 10 MG PO CAPS
10.0000 mg | ORAL_CAPSULE | Freq: Three times a day (TID) | ORAL | Status: DC
  Administered 2017-08-19 – 2017-08-20 (×6): 10 mg via ORAL
  Filled 2017-08-18 (×6): qty 1

## 2017-08-18 MED ORDER — EZETIMIBE 10 MG PO TABS
10.0000 mg | ORAL_TABLET | Freq: Every day | ORAL | Status: DC
  Administered 2017-08-19 – 2017-08-20 (×2): 10 mg via ORAL
  Filled 2017-08-18 (×2): qty 1

## 2017-08-18 MED ORDER — HEPARIN SODIUM (PORCINE) 5000 UNIT/ML IJ SOLN
5000.0000 [IU] | Freq: Three times a day (TID) | INTRAMUSCULAR | Status: DC
  Administered 2017-08-18 – 2017-08-20 (×7): 5000 [IU] via SUBCUTANEOUS
  Filled 2017-08-18 (×7): qty 1

## 2017-08-18 MED ORDER — INSULIN ASPART 100 UNIT/ML ~~LOC~~ SOLN
0.0000 [IU] | Freq: Every day | SUBCUTANEOUS | Status: DC
  Administered 2017-08-18: 2 [IU] via SUBCUTANEOUS

## 2017-08-18 MED ORDER — BENZONATATE 100 MG PO CAPS: 100.0000 mg | ORAL_CAPSULE | Freq: Three times a day (TID) | ORAL | Status: DC | PRN

## 2017-08-18 MED ORDER — ALBUTEROL SULFATE (2.5 MG/3ML) 0.083% IN NEBU
5.0000 mg | INHALATION_SOLUTION | Freq: Once | RESPIRATORY_TRACT | Status: AC
  Administered 2017-08-18: 5 mg via RESPIRATORY_TRACT
  Filled 2017-08-18: qty 6

## 2017-08-18 MED ORDER — INSULIN ASPART 100 UNIT/ML ~~LOC~~ SOLN
4.0000 [IU] | Freq: Three times a day (TID) | SUBCUTANEOUS | Status: DC
  Administered 2017-08-18 – 2017-08-20 (×7): 4 [IU] via SUBCUTANEOUS

## 2017-08-18 MED ORDER — GABAPENTIN 100 MG PO CAPS
100.0000 mg | ORAL_CAPSULE | Freq: Three times a day (TID) | ORAL | Status: DC
  Administered 2017-08-18 – 2017-08-20 (×6): 100 mg via ORAL
  Filled 2017-08-18 (×6): qty 1

## 2017-08-18 MED ORDER — VITAMIN D (ERGOCALCIFEROL) 1.25 MG (50000 UNIT) PO CAPS
50000.0000 [IU] | ORAL_CAPSULE | ORAL | Status: DC
  Administered 2017-08-19: 50000 [IU] via ORAL
  Filled 2017-08-18 (×2): qty 1

## 2017-08-18 MED ORDER — PANTOPRAZOLE SODIUM 40 MG PO TBEC
40.0000 mg | DELAYED_RELEASE_TABLET | Freq: Every day | ORAL | Status: DC
  Administered 2017-08-19 – 2017-08-20 (×2): 40 mg via ORAL
  Filled 2017-08-18 (×2): qty 1

## 2017-08-18 MED ORDER — ALBUTEROL SULFATE (2.5 MG/3ML) 0.083% IN NEBU: 5.0000 mg | INHALATION_SOLUTION | Freq: Once | RESPIRATORY_TRACT | Status: DC

## 2017-08-18 MED ORDER — ATORVASTATIN CALCIUM 20 MG PO TABS
20.0000 mg | ORAL_TABLET | Freq: Every day | ORAL | Status: DC
  Administered 2017-08-18 – 2017-08-20 (×3): 20 mg via ORAL
  Filled 2017-08-18 (×3): qty 1

## 2017-08-18 MED ORDER — INSULIN DETEMIR 100 UNIT/ML ~~LOC~~ SOLN
65.0000 [IU] | Freq: Every day | SUBCUTANEOUS | Status: DC
  Administered 2017-08-18: 65 [IU] via SUBCUTANEOUS
  Filled 2017-08-18 (×3): qty 0.65

## 2017-08-18 MED ORDER — IPRATROPIUM-ALBUTEROL 0.5-2.5 (3) MG/3ML IN SOLN
3.0000 mL | Freq: Once | RESPIRATORY_TRACT | Status: AC
  Administered 2017-08-18: 3 mL via RESPIRATORY_TRACT
  Filled 2017-08-18: qty 3

## 2017-08-18 MED ORDER — ONDANSETRON HCL 4 MG/2ML IJ SOLN: 4.0000 mg | Freq: Four times a day (QID) | INTRAMUSCULAR | Status: DC | PRN

## 2017-08-18 MED ORDER — ONDANSETRON HCL 4 MG PO TABS: 4.0000 mg | ORAL_TABLET | Freq: Four times a day (QID) | ORAL | Status: DC | PRN

## 2017-08-18 MED ORDER — LABETALOL HCL 200 MG PO TABS
400.0000 mg | ORAL_TABLET | Freq: Two times a day (BID) | ORAL | Status: DC
  Administered 2017-08-18 – 2017-08-20 (×4): 400 mg via ORAL
  Filled 2017-08-18 (×4): qty 2

## 2017-08-18 MED ORDER — SODIUM CHLORIDE 0.9 % IV BOLUS
500.0000 mL | Freq: Once | INTRAVENOUS | Status: AC
  Administered 2017-08-18: 500 mL via INTRAVENOUS

## 2017-08-18 MED ORDER — INSULIN ASPART 100 UNIT/ML ~~LOC~~ SOLN
0.0000 [IU] | Freq: Three times a day (TID) | SUBCUTANEOUS | Status: DC
  Administered 2017-08-18 – 2017-08-19 (×2): 5 [IU] via SUBCUTANEOUS
  Administered 2017-08-19: 3 [IU] via SUBCUTANEOUS
  Administered 2017-08-19: 5 [IU] via SUBCUTANEOUS
  Administered 2017-08-20: 3 [IU] via SUBCUTANEOUS
  Administered 2017-08-20: 2 [IU] via SUBCUTANEOUS

## 2017-08-18 MED ORDER — INSULIN ASPART 100 UNIT/ML ~~LOC~~ SOLN
10.0000 [IU] | Freq: Once | SUBCUTANEOUS | Status: AC
  Administered 2017-08-18: 10 [IU] via SUBCUTANEOUS
  Filled 2017-08-18: qty 1

## 2017-08-18 NOTE — Progress Notes (Signed)
Pt states she only uses her home CPAP sometimes. Pt refused to use hospital CPAP when offered one for this hospital stay. Pt encouraged to use CPAP but continues to refuse.

## 2017-08-18 NOTE — Telephone Encounter (Signed)
08/18/17  someone called for patient and cx because patient has pneumonia

## 2017-08-18 NOTE — H&P (Signed)
History and Physical    Kathryn Beck EXB:[id number] DOB: [birth year] DOA: 08/18/2017  PCP: Fayrene Helper, MD   I have briefly reviewed patients previous medical reports in [facility]  Patient coming from: home  Chief Complaint: SOB, anorexia, not feeling good.   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  Patient was found with community-acquired pneumonia and started on antibiotics.  She received IV fluids and so nebulizer treatment prior to be discharged.  In the last 2 days after that visit to the emergency department patient reported that her symptom has worsened, even that she is taking the antibiotics prescribed she is having difficulty eating, having nausea, no feeling good with worsening breathing especially with activity.  Patient reports continued to have productive cough and is spiking fevers at home.  Due to lack of response to outpatient treatment she contacted her PCP and was instructed to return to the emergency department for further evaluation.  Patient felt that she can no take care of herself currently. She denies headaches, focal deficits, vomiting, melena, hematochezia, hematuria, hemoptysis, hematemesis, abdominal pain or any other acute complaints.  ED Course: Chest x-ray and CT scan of the chest repeated; no pulmonary embolism, positive changes demonstrating left lung community-acquired pneumonia.  Patient also found with acute on chronic renal failure and hyperglycemia (CBG 549, normal anion gap).  Positive nausea and anorexia.  Due to new component of worsening renal function patient will be admitted to the hospital for further evaluation and management. IVF's initiated,  cultures taken and antibiotics started.   Review of Systems:  All other systems reviewed and apart from HPI, are negative.  Past Medical History:  Diagnosis Date  . Acid reflux   . Anemia   . Arthritis   . Axillary masses    Soft tissue - status post excision  . Back pain   . Chronic kidney disease   . Essential hypertension   . Ischemic heart disease    Abnormal Myoview April 2018 - medical therapy  . Mixed hyperlipidemia   . Obesity   . Pancreatitis   . Sleep apnea    Noncompliant with CPAP  . Type 2 diabetes mellitus, uncontrolled ([place])     Past Surgical History:  Procedure Laterality Date  . ABDOMINAL HYSTERECTOMY    . COLONOSCOPY  2008   Dr. Oneida Alar: normal   . COLONOSCOPY N/A 12/18/2016   Dr. Oneida Alar: multiple tubular adenomas, internal hemorrhoids. Surveillance in 3 years   . ESOPHAGEAL DILATION N/A 10/13/2015   Procedure: ESOPHAGEAL DILATION;  Surgeon: Rogene Houston, MD;  Location: [facility];  Service: Endoscopy;  Laterality: N/A;  . ESOPHAGOGASTRODUODENOSCOPY N/A 10/13/2015   Dr. Laural Golden: chronic gastritis on path, no H.pylori. Empiric dilation   . ESOPHAGOGASTRODUODENOSCOPY N/A 12/18/2016   Dr. Oneida Alar: mild gastritis. BRAVO study revealed uncontrolled GERD. Dysphagia secondary to uncontrolled reflux  . FOOT SURGERY Bilateral   . LUNG BIOPSY    . MASS EXCISION Right 01/09/2013   Procedure: EXCISION OF NEOPLASM OF RIGHT  AXILLA  AND EXCISION OF NEOPLASM OF LEFT AXILLA;  Surgeon: Jamesetta So, MD;  Location: [facility];  Service: General;  Laterality: Right;  procedure end @ 08:23  . MYRINGOTOMY WITH TUBE PLACEMENT Bilateral  04/28/2017   Procedure: BILATERAL MYRINGOTOMY WITH TUBE PLACEMENT;  Surgeon: Leta Baptist, MD;  Location: [place];  Service: ENT;  Laterality: Bilateral;  . SAVORY DILATION N/A 12/18/2016   Procedure: SAVORY DILATION;  Surgeon: Danie Binder, MD;  Location: [facility];  Service: Endoscopy;  Laterality: N/A;    Social History  reports that she has  never smoked. She has never used smokeless tobacco. She reports that she does not drink alcohol or use drugs.  Allergies  Allergen Reactions  . Ace Inhibitors Anaphylaxis and Swelling  . Penicillins Itching, Swelling and Other (See Comments)    Has patient had a PCN reaction causing immediate rash, facial/tongue/throat swelling, SOB or lightheadedness with hypotension: Yes Has patient had a PCN reaction causing severe rash involving mucus membranes or skin necrosis: No Has patient had a PCN reaction that required hospitalization No Has patient had a PCN reaction occurring within the last 10 years: No If all of the above answers are "NO", then may proceed with Cephalosporin use.   . Statins Other (See Comments)    elevated LFT's    Family History  Problem Relation Age of Onset  . Hypertension Father   . Hypercholesterolemia Father   . Arthritis Father   . Hypertension Sister   . Hypercholesterolemia Sister   . Breast cancer Sister   . Hypertension Sister   . Colon cancer Neg Hx   . Colon polyps Neg Hx      Prior to Admission medications   Medication Sig Start Date End Date Taking? Authorizing Provider  acetaminophen (TYLENOL) 500 MG tablet Take 1,000 mg by mouth every 6 (six) hours as needed for moderate pain or headache.   Yes [provider]  albuterol (PROVENTIL HFA;VENTOLIN HFA) 108 (90 Base) MCG/ACT inhaler Inhale 2 puffs into the lungs every 4 (four) hours as needed for wheezing or shortness of breath. 08/15/17  Yes Noemi Chapel, MD  amLODipine (NORVASC) 10 MG tablet Take 1 tablet (10 mg total) by mouth daily. 05/31/17  Yes Fayrene Helper, MD  atorvastatin (LIPITOR) 20 MG tablet Take 1 tablet (20 mg total) by mouth daily. 05/31/17  Yes Fayrene Helper, MD  azelastine (ASTELIN) 0.1 % nasal spray Place 2 sprays into both nostrils 2 (two) times daily. Use in each nostril as directed 05/31/17  Yes Fayrene Helper, MD  benzonatate (TESSALON) 100 MG capsule Take  1 capsule (100 mg total) by mouth every 8 (eight) hours. 08/15/17  Yes Noemi Chapel, MD  calcitRIOL (ROCALTROL) 0.25 MCG capsule Take 0.25 mcg by mouth every Monday, Wednesday, and Friday.  09/15/16  Yes Fayrene Helper, MD  cloNIDine (CATAPRES) 0.1 MG tablet TAKE 1 TABLET BY MOUTH  EVERY EVENING AT 9PM FOR  BLOOD PRESSURE 05/31/17  Yes Fayrene Helper, MD  dexlansoprazole (DEXILANT) 60 MG capsule 1 PO EVERY MORNING WITH BREAKFAST. 04/15/17  Yes Fields, Marga Melnick, MD  dicyclomine (BENTYL) 10 MG capsule 1 po qachs if needed to prevent diarrhea and abdominal cramps 12/18/16  Yes Fields, Marga Melnick, MD  ezetimibe (ZETIA) 10 MG tablet Take 1 tablet (10 mg total) by mouth daily. 05/31/17  Yes Fayrene Helper, MD  furosemide (LASIX) 40 MG tablet One tablet two times daily, as needed, for leg swelling 07/11/17  Yes Fayrene Helper, MD  gabapentin (NEURONTIN) 100 MG capsule Take 1 capsule (100 mg total) by mouth 3 (three) times daily. 07/05/17  Yes Edrick Kins, DPM  Insulin Detemir (LEVEMIR) 100 UNIT/ML Pen  Inject 100 Units into the skin daily at 10 pm. 03/19/17  Yes [name], MD  Insulin Lispro (HUMALOG KWIKPEN) 200 UNIT/ML SOPN Inject 32-36 Units into the skin 3 (three) times daily before meals. 06/18/17  Yes [name], MD  isosorbide mononitrate (IMDUR) 30 MG 24 hr tablet Take 1 tablet (30 mg total) by mouth daily. 05/31/17  Yes Fayrene Helper, MD  labetalol (NORMODYNE) 200 MG tablet Take 2 tablets (400 mg total) by mouth 2 (two) times daily. 05/31/17  Yes Fayrene Helper, MD  levofloxacin (LEVAQUIN) 500 MG tablet Take 1 tablet (500 mg total) by mouth daily. 08/15/17  Yes Noemi Chapel, MD  olmesartan (BENICAR) 40 MG tablet Take 1 tablet (40 mg total) by mouth daily. 07/16/17  Yes Fayrene Helper, MD  Vitamin D, Ergocalciferol, (DRISDOL) 50000 units CAPS capsule Take 1 capsule (50,000 Units total) by mouth every 7 (seven) days. 07/08/17  Yes Fayrene Helper, MD    FLUoxetine (PROZAC) 10 MG capsule Take 10 mg by mouth daily.    05/28/11  [provider]  glipiZIDE (GLUCOTROL) 10 MG tablet Take 10 mg by mouth 2 (two) times daily before a meal.    05/28/11  [provider]    Physical Exam: Vitals:   08/18/17 1500 08/18/17 1530 08/18/17 1600 08/18/17 1647  BP: (!) 174/84 (!) 169/81 (!) 172/79 (!) 173/82  Pulse: 84 81 76 73  Resp: (!) 22 (!) 35  20  Temp:    98.2 F (36.8 C)  TempSrc:    Oral  SpO2: 98% 98% 98% 100%  Weight:      Height:    5\' 4"  (1.626 m)    Constitutional: Feeling short of breath especially with exertion, no chest pain, no nausea, no vomiting.  Afebrile. Eyes: PERTLA, lids and conjunctivae normal, no icterus, no nystagmus. ENMT: Mucous membranes are dried and examined. Posterior pharynx clear of any exudate or lesions. No thrush. Neck: supple, no masses, no thyromegaly, no JVD Respiratory: Decreased breath sounds at the bases, positive rhonchi and scattered mild end expiratory wheezing; no crackles, no using accessory muscles. Cardiovascular: S1 & S2 heard, regular rate and rhythm, no murmurs / rubs / gallops. No extremity edema. 2+ pedal pulses. No carotid bruits.  Abdomen: Obese, no distension, no tenderness, no masses palpated. No hepatosplenomegaly. Bowel sounds normal.  Musculoskeletal: no clubbing / cyanosis. No joint deformity upper and lower extremities. Good ROM, no contractures. Normal muscle tone.  Skin: no rashes, lesions, ulcers. No induration Neurologic: CN 2-12 grossly intact. Sensation intact, DTR normal. Strength 5/5 in all 4 limbs.  Psychiatric: Normal judgment and insight. Alert and oriented x 3. Normal mood.   Labs on Admission: I have personally reviewed following labs and imaging studies  CBC: Recent Labs  Lab 08/15/17 1018 08/18/17 1210  WBC 9.4 8.9  NEUTROABS 6.2 7.1  HGB 9.5* 8.4*  HCT 31.7* 28.2*  MCV 73.5* 73.1*  PLT 492* 528*   Basic Metabolic Panel: Recent Labs  Lab  08/15/17 1018 08/18/17 1210  NA 138 138  K 3.9 4.1  CL 103 106  CO2 25 22  GLUCOSE 445* 509*  BUN 28* 39*  CREATININE 2.80* 3.73*  CALCIUM 8.5* 8.1*   Liver Function Tests: Recent Labs  Lab 08/15/17 1018  AST 17  ALT 23  ALKPHOS 98  BILITOT 0.6  PROT 6.6  ALBUMIN 2.5*   Cardiac Enzymes: Recent Labs  Lab 08/18/17 1210  TROPONINI <0.03   CBG: Recent Labs  Lab 08/15/17 1223 08/18/17 1720  GLUCAP 439* 215*   Urine analysis:    Component Value Date/Time   COLORURINE YELLOW 08/18/2017 1129   APPEARANCEUR HAZY (A) 08/18/2017 1129   LABSPEC 1.018 08/18/2017 1129   PHURINE 5.0 08/18/2017 1129   GLUCOSEU >=500 (A) 08/18/2017 1129   HGBUR SMALL (A) 08/18/2017 1129   BILIRUBINUR NEGATIVE 08/18/2017 1129   BILIRUBINUR neg 02/15/2017 0913   KETONESUR NEGATIVE 08/18/2017 1129   PROTEINUR >=300 (A) 08/18/2017 1129   UROBILINOGEN 0.2 02/15/2017 0913   NITRITE NEGATIVE 08/18/2017 1129   LEUKOCYTESUR NEGATIVE 08/18/2017 1129   Radiological Exams on Admission: Dg Chest 2 View  Result Date: 08/18/2017 CLINICAL DATA:  Shortness of breath, cough, chest soreness EXAM: CHEST - 2 VIEW COMPARISON:  08/17/2017 FINDINGS: Heart is borderline in size. Lingular linear atelectasis or scarring. Right lung clear. No effusions or acute bony abnormality. IMPRESSION: Lingular atelectasis or scarring. Electronically Signed   By: Rolm Baptise M.D.   On: 08/18/2017 11:53   Ct Chest Wo Contrast  Result Date: 08/18/2017 CLINICAL DATA:  Recent diagnosis of pneumonia with persistent symptoms. Shortness of breath. EXAM: CT CHEST WITHOUT CONTRAST TECHNIQUE: Multidetector CT imaging of the chest was performed following the standard protocol without IV contrast. COMPARISON:  Chest radiography same day. FINDINGS: Cardiovascular: Heart size upper limits of normal. No pericardial fluid. Minimal visible coronary artery calcification. Mild calcification in the region of the aortic valve and aortic arch.  Mediastinum/Nodes: No mass or lymphadenopathy. Lungs/Pleura: The right lung is clear and normal. No pleural fluid on the right. The left lung shows mild patchy density in the left upper lobe, lingula and left lower lobe consistent with mild or resolving pneumonia. There is a small amount of pleural fluid on the left with mild dependent atelectasis. No finding to suggest underlying mass lesion. Upper Abdomen: Bilateral adrenal enlargement. This could represent adrenal hyperplasia or bilateral adrenal masses. These are unchanged from the previous abdominal CT of January 2019. Pancreatic head not included in the region studied. Musculoskeletal: Ordinary thoracic degenerative changes. IMPRESSION: Patchy density and volume loss in the left upper lobe, lingula and left lower lobe with a small left effusion. Findings would be consistent with resolving pneumonia. No specific features. Aortic atherosclerosis.  Mild coronary artery calcification. Bilateral adrenal enlargement redemonstrated, not visibly changed since the abdominal CT of January. Electronically Signed   By: Nelson Chimes M.D.   On: 08/18/2017 14:40    EKG: No acute ischemic changes.  Normal axis, sinus rhythm.  Assessment/Plan 1-community acquired pneumonia: Patient with diagnosis of community-acquired pneumonia that was confirmed by chest x-ray and CT scan. -No pulmonary embolism appreciated on CT scan -Will be admitted for IV antibiotics, Pulmicort nebulizer, as needed DuoNeb, flutter valve and supportive care. -expecting full recovery and improvement. -follow sputum culture, blood culture, urine strep antigen and Legionella antigen  2-lactic acidosis -Lactic acid 2.9 -Most likely in the setting of acute pneumonia along with significant use of albuterol nebulization -Will provide IV fluids -minimize The use of albuterol -follow clinical response -no signs of systemic infection or sepsis.  3-HLD (hyperlipidemia) -Continue statins and  Zetia  4-obesity: In the setting of increased calorie intake -Body mass index is 35.7 kg/m. -low calorie diet, portion control and increase physical activities discussed with patient   5-Sleep apnea -no using CPAP -will monitor -advise to lose weight   6-GERD (gastroesophageal reflux disease) -continue PPI  7-Uncontrolled type 2 diabetes mellitus with nephropathy (HCC) -CBG elevated in the setting of  no using insulin as prescribed as she was not eating good prior to admission -will resume levemir at 65 units -start SSI and meal coverage -low carb diet -IVF"s -follow CBG's -further adjustments to her hypoglycemic regimen bse on CBG fluctuation -patient started to be follow by endocrinologist in [place] (Dr. Cruzita Lederer)  8-Acute renal failure superimposed on stage 3 chronic kidney disease ([place]) -pre-renal in nature -will hold diuretics and ARB -will provide IVF's -follow renal function trend    Time: 70 minutes.   DVT prophylaxis: Heparin Code Status: Full code Family Communication: No family at bedside Disposition Plan: Patient discharged home once breathing status improved and renal function back to baseline. Consults called: None Admission status: Inpatient, MedSurg bed, LOS more than 2 midnights.   Barton Dubois MD [facility] Pager [phone]  If 7PM-7AM, please contact night-coverage [email or web address] Password [facility]  08/18/2017, 7:34 PM

## 2017-08-18 NOTE — ED Notes (Signed)
Date and time results received: 08/18/17 1504 (use smartphrase ".now" to insert current time)  Test: lac acid Critical Value: 2.9  Name of Provider Notified: [email or web address]  Orders Received? Or Actions Taken?: none

## 2017-08-18 NOTE — ED Triage Notes (Signed)
Patient states she was diagnosed with pneumonia last Saturday and told to come back if symptoms do not improve. Patient presents in triage short of breath with labored breathing. Patient state she can't catch her breath even at rest and can't sleep due to shortness of breath.

## 2017-08-18 NOTE — ED Provider Notes (Signed)
[facility] EMERGENCY DEPARTMENT Provider Note   CSN: [id number] Arrival date & time: 08/18/17  1058     History   Chief Complaint Chief Complaint  Patient presents with  . Shortness of Breath    HPI Kathryn Beck is a 62 y.o. female.   Shortness of Breath     Pt was seen at 1130. Per pt, c/o gradual onset and persistence of constant cough, wheezing, and SOB for the past 1.5 weeks. Pt was evaluated by her PMD last week and in the ED (3 days ago) for her symptoms. Pt states she was dx pneumonia and rx antibiotics and MDI. Pt states she has been taking the medications as prescribed and does not feel any better. Denies CP/palpitations, no back pain, no abd pain, no N/V/D, no fevers, no calf/LE pain or unilateral swelling.   Past Medical History:  Diagnosis Date  . Acid reflux   . Anemia   . Arthritis   . Axillary masses    Soft tissue - status post excision  . Back pain   . Chronic kidney disease   . Essential hypertension   . Ischemic heart disease    Abnormal Myoview April 2018 - medical therapy  . Mixed hyperlipidemia   . Obesity   . Pancreatitis   . Sleep apnea    Noncompliant with CPAP  . Type 2 diabetes mellitus, uncontrolled ([place])     Patient Active Problem List   Diagnosis Date Noted  . Unsteady gait 07/11/2017  . Recurrent falls 07/11/2017  . Anemia of chronic disease 03/24/2017  . History of colonic polyps 02/10/2017  . Dysphagia 11/05/2016  . IBS (irritable bowel syndrome) 02/04/2016  . Personal history of noncompliance with medical treatment, presenting hazards to health 10/21/2015  . Postprandial epigastric pain   . Heat sensitivity 10/07/2014  . Cardiac murmur 01/17/2014  . Hyperuricemia 08/04/2013  . Seasonal allergies 03/10/2013  . Lipoma of back 12/22/2012  . GERD (gastroesophageal reflux disease) 08/22/2012  . Sleep apnea 06/02/2011  . CKD (chronic kidney disease) stage 4, GFR 15-29 ml/min (HCC) 01/13/2011  . BACK PAIN WITH RADICULOPATHY  04/16/2010  . FATIGUE 07/24/2009  . LIVER FUNCTION TESTS, ABNORMAL, HX OF 05/08/2009  . LUPUS ERYTHEMATOSUS, DISCOID 06/05/2008  . Uncontrolled diabetes mellitus with diabetic nephropathy, with long-term current use of insulin ([place]) 06/07/2007  . Mixed hyperlipidemia 06/07/2007  . Obesity 06/07/2007  . Malignant hypertension 06/07/2007    Past Surgical History:  Procedure Laterality Date  . ABDOMINAL HYSTERECTOMY    . COLONOSCOPY  2008   Dr. Oneida Alar: normal   . COLONOSCOPY N/A 12/18/2016   Dr. Oneida Alar: multiple tubular adenomas, internal hemorrhoids. Surveillance in 3 years   . ESOPHAGEAL DILATION N/A 10/13/2015   Procedure: ESOPHAGEAL DILATION;  Surgeon: Rogene Houston, MD;  Location: [facility];  Service: Endoscopy;  Laterality: N/A;  . ESOPHAGOGASTRODUODENOSCOPY N/A 10/13/2015   Dr. Laural Golden: chronic gastritis on path, no H.pylori. Empiric dilation   . ESOPHAGOGASTRODUODENOSCOPY N/A 12/18/2016   Dr. Oneida Alar: mild gastritis. BRAVO study revealed uncontrolled GERD. Dysphagia secondary to uncontrolled reflux  . FOOT SURGERY Bilateral   . LUNG BIOPSY    . MASS EXCISION Right 01/09/2013   Procedure: EXCISION OF NEOPLASM OF RIGHT  AXILLA  AND EXCISION OF NEOPLASM OF LEFT AXILLA;  Surgeon: Jamesetta So, MD;  Location: [facility];  Service: General;  Laterality: Right;  procedure end @ 08:23  . MYRINGOTOMY WITH TUBE PLACEMENT Bilateral 04/28/2017   Procedure: BILATERAL MYRINGOTOMY WITH TUBE  PLACEMENT;  Surgeon: Leta Baptist, MD;  Location: [place];  Service: ENT;  Laterality: Bilateral;  . SAVORY DILATION N/A 12/18/2016   Procedure: SAVORY DILATION;  Surgeon: Danie Binder, MD;  Location: [facility];  Service: Endoscopy;  Laterality: N/A;     OB History   None      Home Medications    Prior to Admission medications   Medication Sig Start Date End Date Taking? Authorizing Provider  acetaminophen (TYLENOL) 500 MG tablet Take 1,000 mg by mouth every 6 (six) hours as needed for moderate pain  or headache.   Yes [provider]  albuterol (PROVENTIL HFA;VENTOLIN HFA) 108 (90 Base) MCG/ACT inhaler Inhale 2 puffs into the lungs every 4 (four) hours as needed for wheezing or shortness of breath. 08/15/17  Yes Noemi Chapel, MD  amLODipine (NORVASC) 10 MG tablet Take 1 tablet (10 mg total) by mouth daily. 05/31/17  Yes Fayrene Helper, MD  atorvastatin (LIPITOR) 20 MG tablet Take 1 tablet (20 mg total) by mouth daily. 05/31/17  Yes Fayrene Helper, MD  azelastine (ASTELIN) 0.1 % nasal spray Place 2 sprays into both nostrils 2 (two) times daily. Use in each nostril as directed 05/31/17  Yes Fayrene Helper, MD  benzonatate (TESSALON) 100 MG capsule Take 1 capsule (100 mg total) by mouth every 8 (eight) hours. 08/15/17  Yes Noemi Chapel, MD  calcitRIOL (ROCALTROL) 0.25 MCG capsule Take 0.25 mcg by mouth every Monday, Wednesday, and Friday.  09/15/16  Yes Fayrene Helper, MD  cloNIDine (CATAPRES) 0.1 MG tablet TAKE 1 TABLET BY MOUTH  EVERY EVENING AT 9PM FOR  BLOOD PRESSURE 05/31/17  Yes Fayrene Helper, MD  dexlansoprazole (DEXILANT) 60 MG capsule 1 PO EVERY MORNING WITH BREAKFAST. 04/15/17  Yes Fields, Marga Melnick, MD  dicyclomine (BENTYL) 10 MG capsule 1 po qachs if needed to prevent diarrhea and abdominal cramps 12/18/16  Yes Fields, Marga Melnick, MD  ezetimibe (ZETIA) 10 MG tablet Take 1 tablet (10 mg total) by mouth daily. 05/31/17  Yes Fayrene Helper, MD  furosemide (LASIX) 40 MG tablet One tablet two times daily, as needed, for leg swelling 07/11/17  Yes Fayrene Helper, MD  gabapentin (NEURONTIN) 100 MG capsule Take 1 capsule (100 mg total) by mouth 3 (three) times daily. 07/05/17  Yes Edrick Kins, DPM  Insulin Detemir (LEVEMIR) 100 UNIT/ML Pen Inject 100 Units into the skin daily at 10 pm. 03/19/17  Yes [name], MD  Insulin Lispro (HUMALOG KWIKPEN) 200 UNIT/ML SOPN Inject 32-36 Units into the skin 3 (three) times daily before meals. 06/18/17  Yes [name], MD  isosorbide mononitrate (IMDUR) 30 MG 24 hr tablet Take 1 tablet (30 mg total) by mouth daily. 05/31/17  Yes Fayrene Helper, MD  labetalol (NORMODYNE) 200 MG tablet Take 2 tablets (400 mg total) by mouth 2 (two) times daily. 05/31/17  Yes Fayrene Helper, MD  levofloxacin (LEVAQUIN) 500 MG tablet Take 1 tablet (500 mg total) by mouth daily. 08/15/17  Yes Noemi Chapel, MD  olmesartan (BENICAR) 40 MG tablet Take 1 tablet (40 mg total) by mouth daily. 07/16/17  Yes Fayrene Helper, MD  Vitamin D, Ergocalciferol, (DRISDOL) 50000 units CAPS capsule Take 1 capsule (50,000 Units total) by mouth every 7 (seven) days. 07/08/17  Yes Fayrene Helper, MD  FLUoxetine (PROZAC) 10 MG capsule Take 10 mg by mouth daily.    05/28/11  [provider]  glipiZIDE (GLUCOTROL) 10 MG tablet  Take 10 mg by mouth 2 (two) times daily before a meal.    05/28/11  [provider]    Family History Family History  Problem Relation Age of Onset  . Hypertension Father   . Hypercholesterolemia Father   . Arthritis Father   . Hypertension Sister   . Hypercholesterolemia Sister   . Breast cancer Sister   . Hypertension Sister   . Colon cancer Neg Hx   . Colon polyps Neg Hx     Social History Social History   Tobacco Use  . Smoking status: Never Smoker  . Smokeless tobacco: Never Used  Substance Use Topics  . Alcohol use: No  . Drug use: No     Allergies   Ace inhibitors; Penicillins; and Statins   Review of Systems Review of Systems  Respiratory: Positive for shortness of breath.   ROS: Statement: All systems negative except as marked or noted in the HPI; Constitutional: Negative for fever and chills. ; ; Eyes: Negative for eye pain, redness and discharge. ; ; ENMT: Negative for ear pain, hoarseness, nasal congestion, sinus pressure and sore throat. ; ; Cardiovascular: Negative for chest pain, palpitations, diaphoresis, and peripheral edema. ; ; Respiratory: +SOB, cough,  wheezing. Negative for stridor. ; ; Gastrointestinal: Negative for nausea, vomiting, diarrhea, abdominal pain, blood in stool, hematemesis, jaundice and rectal bleeding. . ; ; Genitourinary: Negative for dysuria, flank pain and hematuria. ; ; Musculoskeletal: Negative for back pain and neck pain. Negative for swelling and trauma.; ; Skin: Negative for pruritus, rash, abrasions, blisters, bruising and skin lesion.; ; Neuro: Negative for headache, lightheadedness and neck stiffness. Negative for weakness, altered level of consciousness, altered mental status, extremity weakness, paresthesias, involuntary movement, seizure and syncope.       Physical Exam Updated Vital Signs BP (!) 198/89 (BP Location: Right Arm)   Pulse (!) 107   Temp 98.9 F (37.2 C) (Oral)   Resp (!) 22   Ht 5\' 4"  (1.626 m)   Wt 94.3 kg (208 lb)   SpO2 99%   BMI 35.70 kg/m    Patient Vitals for the past 24 hrs:  BP Temp Temp src Pulse Resp SpO2 Height Weight  08/18/17 1447 (!) 175/88 - - - - - - -  08/18/17 1442 - - - - - 97 % - -  08/18/17 1355 (!) 157/80 97.9 F (36.6 C) Oral 86 18 99 % - -  08/18/17 1330 - - - 95 (!) 28 99 % - -  08/18/17 1230 - - - 89 (!) 23 100 % - -  08/18/17 1215 - - - - - 99 % - -  08/18/17 1202 - - - - - 96 % - -  08/18/17 1116 - - - - - - 5\' 4"  (1.626 m) 94.3 kg (208 lb)  08/18/17 1110 (!) 198/89 98.9 F (37.2 C) Oral (!) 107 (!) 22 98 % - -     Physical Exam 1135: Physical examination:  Nursing notes reviewed; Vital signs and O2 SAT reviewed;  Constitutional: Well developed, Well nourished, Well hydrated, Uncomfortable appearing.;; Head:  Normocephalic, atraumatic; Eyes: EOMI, PERRL, No scleral icterus; ENMT: TM's clear bilat. +edemetous nasal turbinates bilat with clear rhinorrhea. Mouth and pharynx normal, Mucous membranes moist; Neck: Supple, Full range of motion, No lymphadenopathy; Cardiovascular: Tachycardic rate and rhythm, No gallop; Respiratory: Breath sounds coarse & equal  bilaterally, insp/exp wheezes bilat. No audible wheezing. +frequent coughing during exam. Speaking short sentences, sitting upright, tachypneic.; Chest: Nontender,  Movement normal; Abdomen: Soft, Nontender, Nondistended, Normal bowel sounds; Genitourinary: No CVA tenderness; Extremities: Pulses normal, No tenderness, +1 pedal edema bilat. No calf asymmetry.; Neuro: AA&Ox3, Major CN grossly intact.  Speech clear. No gross focal motor or sensory deficits in extremities.; Skin: Color normal, Warm, Dry.   ED Treatments / Results  Labs (all labs ordered are listed, but only abnormal results are displayed)   EKG EKG Interpretation  Date/Time:  Wednesday Aug 18 2017 11:20:35 EDT Ventricular Rate:  98 PR Interval:    QRS Duration: 93 QT Interval:  391 QTC Calculation: 500 R Axis:   66 Text Interpretation:  Sinus rhythm Low voltage, precordial leads Borderline repolarization abnormality Borderline prolonged QT interval Baseline wander When compared with ECG of 08/15/2017 QT has lengthened Confirmed by Francine Graven [phone]) on 08/18/2017 1:06:13 PM   Radiology   Procedures Procedures (including critical care time)  Medications Ordered in ED Medications  levofloxacin (LEVAQUIN) IVPB 750 mg (750 mg Intravenous New Bag/Given 08/18/17 1453)  0.9 %  sodium chloride infusion ( Intravenous New Bag/Given 08/18/17 1542)  ipratropium-albuterol (DUONEB) 0.5-2.5 (3) MG/3ML nebulizer solution 3 mL (has no administration in time range)  albuterol (PROVENTIL) (2.5 MG/3ML) 0.083% nebulizer solution 5 mg (5 mg Nebulization Given 08/18/17 1158)  ipratropium-albuterol (DUONEB) 0.5-2.5 (3) MG/3ML nebulizer solution 3 mL (3 mLs Nebulization Given 08/18/17 1214)  albuterol (PROVENTIL) (2.5 MG/3ML) 0.083% nebulizer solution 2.5 mg (2.5 mg Nebulization Given 08/18/17 1215)  ipratropium-albuterol (DUONEB) 0.5-2.5 (3) MG/3ML nebulizer solution 3 mL (3 mLs Nebulization Given 08/18/17 1440)  sodium chloride 0.9 % bolus  500 mL (0 mLs Intravenous Stopped 08/18/17 1533)  insulin aspart (novoLOG) injection 10 Units (10 Units Subcutaneous Given 08/18/17 1346)  albuterol (PROVENTIL) (2.5 MG/3ML) 0.083% nebulizer solution 2.5 mg (2.5 mg Nebulization Given 08/18/17 1441)  sodium chloride 0.9 % bolus 500 mL (0 mLs Intravenous Stopped 08/18/17 1446)     Initial Impression / Assessment and Plan / ED Course  I have reviewed the triage vital signs and the nursing notes.  Pertinent labs & imaging results that were available during my care of the patient were reviewed by me and considered in my medical decision making (see chart for details).  MDM Reviewed: previous chart, nursing note and vitals Reviewed previous: labs, ECG and x-ray Interpretation: labs, ECG, x-ray and CT scan Total time providing critical care: 30-74 minutes. This excludes time spent performing separately reportable procedures and services. Consults: admitting MD   CRITICAL CARE Performed by: Alfonzo Feller Total critical care time: 35 minutes Critical care time was exclusive of separately billable procedures and treating other patients. Critical care was necessary to treat or prevent imminent or life-threatening deterioration. Critical care was time spent personally by me on the following activities: development of treatment plan with patient and/or surrogate as well as nursing, discussions with consultants, evaluation of patient's response to treatment, examination of patient, obtaining history from patient or surrogate, ordering and performing treatments and interventions, ordering and review of laboratory studies, ordering and review of radiographic studies, pulse oximetry and re-evaluation of patient's condition.   Results for orders placed or performed during the hospital encounter of 52/84/13  Basic metabolic panel  Result Value Ref Range   Sodium 138 135 - 145 mmol/L   Potassium 4.1 3.5 - 5.1 mmol/L   Chloride 106 101 - 111 mmol/L    CO2 22 22 - 32 mmol/L   Glucose, Bld 509 (HH) 65 - 99 mg/dL   BUN 39 (H) 6 - 20 mg/dL  Creatinine, Ser 3.73 (H) 0.44 - 1.00 mg/dL   Calcium 8.1 (L) 8.9 - 10.3 mg/dL   GFR calc non Af Amer 12 (L) >60 mL/min   GFR calc Af Amer 14 (L) >60 mL/min   Anion gap 10 5 - 15  Brain natriuretic peptide  Result Value Ref Range   B Natriuretic Peptide 77.0 0.0 - 100.0 pg/mL  Troponin I  Result Value Ref Range   Troponin I <0.03 <0.03 ng/mL  Lactic acid, plasma  Result Value Ref Range   Lactic Acid, Venous 2.7 (HH) 0.5 - 1.9 mmol/L  Lactic acid, plasma  Result Value Ref Range   Lactic Acid, Venous 2.9 (HH) 0.5 - 1.9 mmol/L  CBC with Differential  Result Value Ref Range   WBC 8.9 4.0 - 10.5 K/uL   RBC 3.86 (L) 3.87 - 5.11 MIL/uL   Hemoglobin 8.4 (L) 12.0 - 15.0 g/dL   HCT 28.2 (L) 36.0 - 46.0 %   MCV 73.1 (L) 78.0 - 100.0 fL   MCH 21.8 (L) 26.0 - 34.0 pg   MCHC 29.8 (L) 30.0 - 36.0 g/dL   RDW 16.0 (H) 11.5 - 15.5 %   Platelets 469 (H) 150 - 400 K/uL   Neutrophils Relative % 79 %   Lymphocytes Relative 16 %   Monocytes Relative 5 %   Eosinophils Relative 0 %   Basophils Relative 0 %   Neutro Abs 7.1 1.7 - 7.7 K/uL   Lymphs Abs 1.4 0.7 - 4.0 K/uL   Monocytes Absolute 0.4 0.1 - 1.0 K/uL   Eosinophils Absolute 0.0 0.0 - 0.7 K/uL   Basophils Absolute 0.0 0.0 - 0.1 K/uL   RBC Morphology MORPHOLOGY UNREMARKABLE   Urinalysis, Routine w reflex microscopic  Result Value Ref Range   Color, Urine YELLOW YELLOW   APPearance HAZY (A) CLEAR   Specific Gravity, Urine 1.018 1.005 - 1.030   pH 5.0 5.0 - 8.0   Glucose, UA >=500 (A) NEGATIVE mg/dL   Hgb urine dipstick SMALL (A) NEGATIVE   Bilirubin Urine NEGATIVE NEGATIVE   Ketones, ur NEGATIVE NEGATIVE mg/dL   Protein, ur >=300 (A) NEGATIVE mg/dL   Nitrite NEGATIVE NEGATIVE   Leukocytes, UA NEGATIVE NEGATIVE   RBC / HPF 0-5 0 - 5 RBC/hpf   WBC, UA 0-5 0 - 5 WBC/hpf   Bacteria, UA RARE (A) NONE SEEN   Squamous Epithelial / LPF 0-5 0 - 5    Mucus PRESENT    Hyaline Casts, UA PRESENT    Dg Chest 2 View Result Date: 08/18/2017 CLINICAL DATA:  Shortness of breath, cough, chest soreness EXAM: CHEST - 2 VIEW COMPARISON:  08/17/2017 FINDINGS: Heart is borderline in size. Lingular linear atelectasis or scarring. Right lung clear. No effusions or acute bony abnormality. IMPRESSION: Lingular atelectasis or scarring. Electronically Signed   By: Rolm Baptise M.D.   On: 08/18/2017 11:53    Ct Chest Wo Contrast Result Date: 08/18/2017 CLINICAL DATA:  Recent diagnosis of pneumonia with persistent symptoms. Shortness of breath. EXAM: CT CHEST WITHOUT CONTRAST TECHNIQUE: Multidetector CT imaging of the chest was performed following the standard protocol without IV contrast. COMPARISON:  Chest radiography same day. FINDINGS: Cardiovascular: Heart size upper limits of normal. No pericardial fluid. Minimal visible coronary artery calcification. Mild calcification in the region of the aortic valve and aortic arch. Mediastinum/Nodes: No mass or lymphadenopathy. Lungs/Pleura: The right lung is clear and normal. No pleural fluid on the right. The left lung shows mild patchy density in the  left upper lobe, lingula and left lower lobe consistent with mild or resolving pneumonia. There is a small amount of pleural fluid on the left with mild dependent atelectasis. No finding to suggest underlying mass lesion. Upper Abdomen: Bilateral adrenal enlargement. This could represent adrenal hyperplasia or bilateral adrenal masses. These are unchanged from the previous abdominal CT of January 2019. Pancreatic head not included in the region studied. Musculoskeletal: Ordinary thoracic degenerative changes. IMPRESSION: Patchy density and volume loss in the left upper lobe, lingula and left lower lobe with a small left effusion. Findings would be consistent with resolving pneumonia. No specific features. Aortic atherosclerosis.  Mild coronary artery calcification. Bilateral adrenal  enlargement redemonstrated, not visibly changed since the abdominal CT of January. Electronically Signed   By: Nelson Chimes M.D.   On: 08/18/2017 14:40    Results for DELMA, DRONE (MRN [id number]) as of 08/18/2017 16:00  Ref. Range 03/26/2017 04:45 03/28/2017 07:17 03/31/2017 08:45 04/28/2017 07:53 08/15/2017 10:18 08/18/2017 12:10  BUN Latest Ref Range: 6 - 20 mg/dL 35 (H) 30 (H) 20 42 (H) 28 (H) 39 (H)  Creatinine Latest Ref Range: 0.44 - 1.00 mg/dL 2.57 (H) 2.47 (H) 2.22 (H) 2.91 (H) 2.80 (H) 3.73 (H)   Results for KARLYN, GLASCO (MRN [id number]) as of 08/18/2017 16:00  Ref. Range 03/26/2017 04:45 03/27/2017 08:15 03/28/2017 07:17 04/28/2017 07:53 08/15/2017 10:18 08/18/2017 12:10  Hemoglobin Latest Ref Range: 12.0 - 15.0 g/dL 8.1 (L) 8.0 (L) 8.2 (L) 8.9 (L) 9.5 (L) 8.4 (L)  HCT Latest Ref Range: 36.0 - 46.0 % 27.8 (L) 27.0 (L) 27.9 (L) 30.1 (L) 31.7 (L) 28.2 (L)    1445: On arrival: pt sitting upright, tachypneic, tachycardic, Sats 98 % R/A, lungs coarse with wheezing and frequent coughing. No hx COPD/asthma, so steroids held and 3 short nebs given. After nebs: pt appears more comfortable at rest, less tachypneic, Sats 96-99 % R/A, lungs continue coarse. Pt ambulated in hallway: pt's O2 Sats maintained 95-97 % R/A but pt c/o increasing SOB, with increasing RR and HR to 140's. Pt escorted back to stretcher with O2 Sats slowly increasing to 97-99 % R/A. IV levaquin give for CAP.  Glucose elevated but AG normal; IVF and SQ insulin given. IVF continued for new AKI.  H/H per baseline. Dx and testing d/w pt and family.  Questions answered.  Verb understanding, agreeable to admit. T/C returned from [facility] Dr. Dyann Kief, case discussed, including:  HPI, pertinent PM/SHx, VS/PE, dx testing, ED course and treatment:  Agreeable to admit.     Final Clinical Impressions(s) / ED Diagnoses   Final diagnoses:  None    ED Discharge Orders    None       Francine Graven, DO 08/21/17 1438

## 2017-08-18 NOTE — ED Notes (Signed)
Date and time results received: 08/18/17 1510 (use smartphrase ".now" to insert current time)  Test: lactic acid Critical Value: 2.9  Name of Provider Notified: Dr. Dyann Kief   Orders Received? Or Actions Taken?: yes

## 2017-08-18 NOTE — ED Notes (Signed)
CRITICAL VALUE ALERT  Critical Value:  Lactic Acid 2.7, Glucose 509  Date & Time Notied:  08/18/17, 1308  Provider Notified: Dr. Thurnell Garbe  Orders Received/Actions taken: no new orders at this time

## 2017-08-19 ENCOUNTER — Telehealth (HOSPITAL_COMMUNITY): Payer: Self-pay | Admitting: Family Medicine

## 2017-08-19 DIAGNOSIS — J181 Lobar pneumonia, unspecified organism: Secondary | ICD-10-CM | POA: Diagnosis not present

## 2017-08-19 DIAGNOSIS — N179 Acute kidney failure, unspecified: Secondary | ICD-10-CM | POA: Diagnosis not present

## 2017-08-19 DIAGNOSIS — K219 Gastro-esophageal reflux disease without esophagitis: Secondary | ICD-10-CM | POA: Diagnosis not present

## 2017-08-19 DIAGNOSIS — E782 Mixed hyperlipidemia: Secondary | ICD-10-CM | POA: Diagnosis not present

## 2017-08-19 LAB — CBC
HCT: 26.4 % — ABNORMAL LOW (ref 36.0–46.0)
HEMOGLOBIN: 7.9 g/dL — AB (ref 12.0–15.0)
MCH: 21.9 pg — ABNORMAL LOW (ref 26.0–34.0)
MCHC: 29.9 g/dL — AB (ref 30.0–36.0)
MCV: 73.1 fL — ABNORMAL LOW (ref 78.0–100.0)
Platelets: 451 10*3/uL — ABNORMAL HIGH (ref 150–400)
RBC: 3.61 MIL/uL — ABNORMAL LOW (ref 3.87–5.11)
RDW: 16 % — ABNORMAL HIGH (ref 11.5–15.5)
WBC: 9.8 10*3/uL (ref 4.0–10.5)

## 2017-08-19 LAB — GLUCOSE, CAPILLARY
GLUCOSE-CAPILLARY: 219 mg/dL — AB (ref 65–99)
GLUCOSE-CAPILLARY: 169 mg/dL — AB (ref 65–99)
Glucose-Capillary: 214 mg/dL — ABNORMAL HIGH (ref 65–99)
Glucose-Capillary: 171 mg/dL — ABNORMAL HIGH (ref 65–99)

## 2017-08-19 LAB — BASIC METABOLIC PANEL
ANION GAP: 10 (ref 5–15)
BUN: 38 mg/dL — AB (ref 6–20)
CALCIUM: 7.8 mg/dL — AB (ref 8.9–10.3)
CO2: 22 mmol/L (ref 22–32)
CREATININE: 3.49 mg/dL — AB (ref 0.44–1.00)
Chloride: 111 mmol/L (ref 101–111)
GFR calc Af Amer: 15 mL/min — ABNORMAL LOW (ref >60)
GFR calc non Af Amer: 13 mL/min — ABNORMAL LOW (ref >60)
Glucose, Bld: 239 mg/dL — ABNORMAL HIGH (ref 65–99)
Potassium: 3.6 mmol/L (ref 3.5–5.1)
Sodium: 143 mmol/L (ref 135–145)

## 2017-08-19 LAB — STREP PNEUMONIAE URINARY ANTIGEN: Strep Pneumo Urinary Antigen: NEGATIVE

## 2017-08-19 LAB — LACTIC ACID, PLASMA: LACTIC ACID, VENOUS: 1.7 mmol/L (ref 0.5–1.9)

## 2017-08-19 MED ORDER — INSULIN DETEMIR 100 UNIT/ML ~~LOC~~ SOLN
75.0000 [IU] | Freq: Every day | SUBCUTANEOUS | Status: DC
  Administered 2017-08-19 – 2017-08-20 (×2): 75 [IU] via SUBCUTANEOUS
  Filled 2017-08-19 (×5): qty 0.75

## 2017-08-19 MED ORDER — LEVOFLOXACIN IN D5W 500 MG/100ML IV SOLN
500.0000 mg | INTRAVENOUS | Status: DC
  Administered 2017-08-20: 500 mg via INTRAVENOUS
  Filled 2017-08-19: qty 100

## 2017-08-19 NOTE — Progress Notes (Signed)
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  Patient was found with community-acquired pneumonia and started on antibiotics.  She received IV fluids and so nebulizer treatment prior to be discharged.  In the last 2 days after that visit to the emergency department patient reported that her symptom has worsened, even that she is taking the antibiotics prescribed she is having difficulty eating, having nausea, no feeling good with worsening breathing especially with activity.  Patient reports continued to have productive cough and is spiking fevers at home.  Due to lack of response to outpatient treatment she contacted her PCP and was instructed to return to the emergency department for further evaluation.  Patient felt that she can no take care of herself currently. She denies headaches, focal deficits, vomiting, melena, hematochezia, hematuria, hemoptysis, hematemesis, abdominal pain or any other acute complaints  Assessment/Plan: 1-community-acquired pneumonia: -No requiring oxygen supplementation Still feeling short of breath and complaining of productive cough Afebrile -Continue current antibiotics -continue flutter valve, pulmicort and PRN antitussives  -follow cultures   2-lactic acidosis -resolved with IVF's  3-HLD -continue statins and Zetia  4-class 2 obesity -Body mass index is 35.7 kg/m. -low calorie diet and diet emphasized   5- OSA -patient decline to use CPAP while inpatient -education about importance using machine provided and explained as well potential  complications of not using it (like pulmonary HTN, right heart failure and arrhythmias)  6-GERD -continue PPI  7-acute on chronic renal failure: stage 3 at baseline -Cr trending down -will continue holding/minimizing nephrotoxic agents -follow renal function trend  -continue IVF's  8-uncontrolled type 2 diabetes with nephropathy  -continue levemir and SSI -follow CBG's and adjust regimen as needed   9-HTN -stable overall -continue current antihypertensive regimen    Code Status: Full Code Family Communication: Son at bedside Disposition Plan: Remains inpatient, continue current antibiotics, continue IV fluids, continue holding nephrotoxic agents.  Repeat basic metabolic panel in a.m.   Consultants:  None  Procedures:  See below for x-ray reports  Antibiotics:  Levaquin  HPI/Subjective: Feeling better, denying nausea or vomiting, no chest pain; reports breathing continued improving.  Still complaining of coughing spells.  Objective: Vitals:   08/19/17 0804 08/19/17 0806  BP:    Pulse:    Resp:    Temp:    SpO2: 98% 98%    Intake/Output Summary (Last 24 hours) at 08/19/2017 0957 Last data filed at 08/19/2017 0500 Gross per 24 hour  Intake 1330 ml  Output -  Net 1330 ml   Filed Weights   08/18/17 1116  Weight: 94.3 kg (208 lb)    Exam:   General: Afebrile, no chest pain, no nausea, no vomiting; breathing continue improving.  Reports still productive coughing spells.  Cardiovascular: No murmurs, no rubs, no gallops, regular rate and rhythm.  Respiratory: No using accessory muscles, positive rhonchi, mild and expiratory wheezing, improved air movement overall.  Abdomen: soft, nontender, nondistended, positive bowel sounds  Musculoskeletal: No edema, no cyanosis, no clubbing.  Data Reviewed: Basic Metabolic Panel: Recent Labs  Lab 08/15/17 1018 08/18/17 1210 08/19/17 0606  NA 138 138 143  K 3.9 4.1 3.6  CL 103 106 111  CO2 25 22 22    GLUCOSE 445* 509* 239*  BUN 28* 39* 38*  CREATININE 2.80* 3.73* 3.49*  CALCIUM 8.5* 8.1* 7.8*  MG  --  1.4*  --   PHOS  --  3.8  --    Liver Function Tests: Recent Labs  Lab 08/15/17 1018  AST 17  ALT 23  ALKPHOS 98  BILITOT 0.6  PROT 6.6  ALBUMIN 2.5*   CBC: Recent Labs  Lab 08/15/17 1018 08/18/17 1210 08/19/17 0606  WBC 9.4 8.9 9.8  NEUTROABS 6.2 7.1  --   HGB 9.5* 8.4* 7.9*  HCT 31.7* 28.2* 26.4*  MCV 73.5* 73.1* 73.1*  PLT 492* 469* 451*   Cardiac Enzymes: Recent Labs  Lab 08/18/17 1210  TROPONINI <0.03   BNP (last 3 results) Recent Labs    02/14/17 1122 08/15/17 1018 08/18/17 1211  BNP 51.0 198.0* 77.0    CBG: Recent Labs  Lab 08/15/17 1223 08/18/17 1720 08/18/17 2038 08/19/17 0720  GLUCAP 439* 215* 209* 214*    Recent Results (from the past 240 hour(s))  Culture, blood (routine x 2) Call MD if unable to obtain prior to antibiotics being given     Status: None (Preliminary result)   Collection Time: 08/18/17  9:21 PM  Result Value Ref Range Status   Specimen Description BLOOD LEFT ARM  Final   Special Requests   Final    BOTTLES DRAWN AEROBIC AND ANAEROBIC Blood Culture adequate volume   Culture   Final    NO GROWTH < 12 HOURS Performed at [facility], [address], [place], [place] [address]    Report Status PENDING  Incomplete  Culture, blood (routine x 2) Call MD if unable to obtain prior to antibiotics being given     Status: None (Preliminary result)   Collection Time: 08/18/17  9:27 PM  Result Value Ref Range Status   Specimen Description BLOOD LEFT ARM  Final   Special Requests   Final    BOTTLES DRAWN AEROBIC AND ANAEROBIC Blood Culture adequate volume   Culture   Final    NO GROWTH < 12 HOURS Performed at [facility], [address], [place], [place] [address]    Report Status PENDING  Incomplete     Studies: Dg Chest 2 View  Result Date: 08/18/2017 CLINICAL DATA:  Shortness of breath, cough, chest soreness EXAM:  CHEST - 2 VIEW COMPARISON:  08/17/2017 FINDINGS: Heart is borderline in size. Lingular linear atelectasis or scarring. Right lung clear. No effusions or acute bony abnormality. IMPRESSION: Lingular atelectasis or scarring. Electronically Signed   By: Rolm Baptise M.D.   On: 08/18/2017 11:53   Ct Chest Wo Contrast  Result Date: 08/18/2017 CLINICAL DATA:  Recent diagnosis of pneumonia with persistent symptoms. Shortness of breath. EXAM: CT CHEST WITHOUT CONTRAST TECHNIQUE: Multidetector CT imaging of the chest was performed following the standard protocol without IV contrast. COMPARISON:  Chest radiography same day. FINDINGS: Cardiovascular: Heart size upper limits of normal. No pericardial fluid. Minimal visible coronary artery calcification. Mild calcification in the region of the aortic valve and aortic arch. Mediastinum/Nodes: No mass or lymphadenopathy. Lungs/Pleura: The right lung is clear and normal. No pleural fluid on the right. The left lung shows mild patchy density in the left upper lobe, lingula and left lower lobe consistent with mild or resolving pneumonia. There is a small amount of pleural fluid on the left with mild dependent atelectasis. No finding to suggest underlying mass  lesion. Upper Abdomen: Bilateral adrenal enlargement. This could represent adrenal hyperplasia or bilateral adrenal masses. These are unchanged from the previous abdominal CT of January 2019. Pancreatic head not included in the region studied. Musculoskeletal: Ordinary thoracic degenerative changes. IMPRESSION: Patchy density and volume loss in the left upper lobe, lingula and left lower lobe with a small left effusion. Findings would be consistent with resolving pneumonia. No specific features. Aortic atherosclerosis.  Mild coronary artery calcification. Bilateral adrenal enlargement redemonstrated, not visibly changed since the abdominal CT of January. Electronically Signed   By: Nelson Chimes M.D.   On: 08/18/2017 14:40     Scheduled Meds: . amLODipine  10 mg Oral Daily  . atorvastatin  20 mg Oral Daily  . azelastine  2 spray Each Nare BID  . budesonide (PULMICORT) nebulizer solution  0.5 mg Nebulization BID  . cloNIDine  0.1 mg Oral Q24H  . dextromethorphan-guaiFENesin  1 tablet Oral BID  . dicyclomine  10 mg Oral TID AC  . ezetimibe  10 mg Oral Daily  . gabapentin  100 mg Oral TID  . heparin injection (subcutaneous)  5,000 Units Subcutaneous Q8H  . insulin aspart  0-15 Units Subcutaneous TID WC  . insulin aspart  0-5 Units Subcutaneous QHS  . insulin aspart  4 Units Subcutaneous TID WC  . insulin detemir  75 Units Subcutaneous Daily  . ipratropium-albuterol  3 mL Nebulization Q6H  . isosorbide mononitrate  30 mg Oral Daily  . labetalol  400 mg Oral BID  . pantoprazole  40 mg Oral Daily  . Vitamin D (Ergocalciferol)  50,000 Units Oral Q7 days   Continuous Infusions: . sodium chloride 100 mL/hr at 08/18/17 2350  . [START ON 08/20/2017] levofloxacin (LEVAQUIN) IV      Principal Problem:   Community acquired pneumonia Active Problems:   HLD (hyperlipidemia)   Obesity   Sleep apnea   GERD (gastroesophageal reflux disease)   Uncontrolled type 2 diabetes mellitus with nephropathy ([place])   Acute renal failure superimposed on stage 3 chronic kidney disease ([place])    Time spent: 30 minutes    [facility] Pager [phone]. If 7PM-7AM, please contact night-coverage at [email or web address], password [facility] 08/19/2017, 9:57 AM  LOS: 1 day

## 2017-08-19 NOTE — Telephone Encounter (Signed)
Pt admitted to hospital yesterday.

## 2017-08-19 NOTE — Progress Notes (Signed)
Inpatient Diabetes Program Recommendations  AACE/ADA: New Consensus Statement on Inpatient Glycemic Control (2015)  Target Ranges:  Prepandial:   less than 140 mg/dL      Peak postprandial:   less than 180 mg/dL (1-2 hours)      Critically ill patients:  140 - 180 mg/dL   Lab Results  Component Value Date   GLUCAP 219 (H) 08/19/2017   HGBA1C 11.0 (H) 08/18/2017    Review of Glycemic Control  Diabetes history: DM 2, Sees Dr. Cruzita Lederer Outpatient Diabetes medications: Levemir 100 units qhs, Humalog 32-36 units tid with meals Current orders for Inpatient glycemic control: Levemir 75 units, Novolog Moderate Correction 0-15 units, Novolog HS scale 0-5 units, Novolog 4 units tide meal coverage  Inpatient Diabetes Program Recommendations:    Patient's A1c this admission 11% unchanged from last appointment with Dr. Cruzita Lederer on 3/29. Patient not taking insulin when sick due to not eating. Will attach sick day guidelines to d/c paperwork for patient to refer to.  Spoke with patient over the phone. Patient is skipping meals sometimes but when she is feeling sick she is atill taking her long acting insulin. Discussed with patient about using a sliding scale for when she is unable to eat to cover her hyperglycemia. Patient tells me that any little thing she eats increases her sugar. Explained what foods had carbohydrate in them. Discussed some meal options during the day based on what she told me she ate (rice crispies in the morning and sometimes a sandwich for lunch). Will attach meal planning information for patient as well. Informed patient A1c is unchanged from check at Dr. Arman Filter office.   Thanks,  Tama Headings RN, MSN, BC-ADM, [facility] Inpatient Diabetes Coordinator Team Pager [phone] (8a-5p)

## 2017-08-19 NOTE — Telephone Encounter (Signed)
08/19/17  pt called and is in the hospital with pneumonia and asked that we put her on hold for now

## 2017-08-20 ENCOUNTER — Encounter (HOSPITAL_COMMUNITY): Payer: Self-pay

## 2017-08-20 DIAGNOSIS — J181 Lobar pneumonia, unspecified organism: Secondary | ICD-10-CM | POA: Diagnosis not present

## 2017-08-20 DIAGNOSIS — R0602 Shortness of breath: Secondary | ICD-10-CM | POA: Diagnosis not present

## 2017-08-20 DIAGNOSIS — K219 Gastro-esophageal reflux disease without esophagitis: Secondary | ICD-10-CM | POA: Diagnosis not present

## 2017-08-20 DIAGNOSIS — N179 Acute kidney failure, unspecified: Secondary | ICD-10-CM | POA: Diagnosis not present

## 2017-08-20 LAB — BASIC METABOLIC PANEL
ANION GAP: 5 (ref 5–15)
BUN: 35 mg/dL — ABNORMAL HIGH (ref 6–20)
CO2: 21 mmol/L — AB (ref 22–32)
Calcium: 8 mg/dL — ABNORMAL LOW (ref 8.9–10.3)
Chloride: 119 mmol/L — ABNORMAL HIGH (ref 101–111)
Creatinine, Ser: 3.28 mg/dL — ABNORMAL HIGH (ref 0.44–1.00)
GFR calc non Af Amer: 14 mL/min — ABNORMAL LOW (ref >60)
GFR, EST AFRICAN AMERICAN: 16 mL/min — AB (ref >60)
GLUCOSE: 133 mg/dL — AB (ref 65–99)
POTASSIUM: 4 mmol/L (ref 3.5–5.1)
Sodium: 145 mmol/L (ref 135–145)

## 2017-08-20 LAB — GLUCOSE, CAPILLARY
Glucose-Capillary: 132 mg/dL — ABNORMAL HIGH (ref 65–99)
Glucose-Capillary: 190 mg/dL — ABNORMAL HIGH (ref 65–99)
Glucose-Capillary: 107 mg/dL — ABNORMAL HIGH (ref 65–99)

## 2017-08-20 LAB — URINE CULTURE

## 2017-08-20 LAB — HIV ANTIBODY (ROUTINE TESTING W REFLEX): HIV SCREEN 4TH GENERATION: NONREACTIVE

## 2017-08-20 MED ORDER — OXYCODONE HCL 5 MG PO TABS
5.0000 mg | ORAL_TABLET | Freq: Once | ORAL | Status: AC
  Administered 2017-08-20: 5 mg via ORAL
  Filled 2017-08-20: qty 1

## 2017-08-20 MED ORDER — LEVOFLOXACIN 500 MG PO TABS: 500.0000 mg | ORAL_TABLET | ORAL | 0 refills | Status: AC

## 2017-08-20 MED ORDER — OLMESARTAN MEDOXOMIL 40 MG PO TABS: 40.0000 mg | ORAL_TABLET | Freq: Every day | ORAL | Status: DC

## 2017-08-20 MED ORDER — DM-GUAIFENESIN ER 30-600 MG PO TB12
1.0000 | ORAL_TABLET | Freq: Two times a day (BID) | ORAL | 0 refills | Status: DC
Start: 2017-08-20 — End: 2018-01-10

## 2017-08-20 MED ORDER — METHOCARBAMOL 500 MG PO TABS: 500.0000 mg | ORAL_TABLET | Freq: Three times a day (TID) | ORAL | 0 refills | Status: DC | PRN

## 2017-08-20 NOTE — Discharge Summary (Signed)
Physician Discharge Summary  Kathryn Beck QQV:[id number] DOB: [birth year] DOA: 08/18/2017  PCP: Fayrene Helper, MD  Admit date: 08/18/2017 Discharge date: 08/20/2017  Time spent: 35 minutes  Recommendations for Outpatient Follow-up:  1. Repeat basic metabolic panel to follow electrolytes and renal function 2. Repeat chest x-ray in 4-6 weeks to assure complete resolution of infiltrates. 3. Patient to continue close follow-up with her endocrinologist as an outpatient for further adjustment to her hypoglycemic regimen as needed.   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  Patient discharged home with instruction to follow-up with PCP in 10 days.  Diet recommendation: Low calorie diet, heart healthy diet, modified carbohydrates.  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 Patient was found with community-acquired pneumonia and started on antibiotics. She received IV fluids and so nebulizer treatment prior to be discharged. In the last 2 days after that visit to the emergency department patient reported that her symptom has worsened, even that she is taking the antibiotics prescribed she is having difficulty eating, having nausea, no feeling good with worsening breathing especially with  activity. Patient reports continued to have productive cough and is spiking fevers at home. Due to lack of response to outpatient treatment she contacted her PCP and was instructed to return to the emergency department for further evaluation. Patient felt that she can no take care of herself currently. She denies headaches, focal deficits, vomiting, melena, hematochezia, hematuria, hemoptysis, hematemesis, abdominal pain or any other acute complaints  Hospital Course:  1-community-acquired pneumonia: -No requiring oxygen supplementation; good O2 sat on RA. -reports breathing much improved and able to tolerate diet and meds w/o problems.  -Afebrile and improved. -continue flutter valve, home PRN inhaler and PRN antitussives  -will discharge on oral levaquin to complete 3 more doses (given q48 hours due to renal function) -cultures remained negative at time of discharge.  2-lactic acidosis -resolved with IVF's  3-HLD -continue statins and Zetia -heart healthy diet encouraged -once sugar under better control, that would also help controlling her cholesterolemia.  4-class 2 obesity -Body mass index is 35.7 kg/m. -low calorie diet and diet emphasized   5- OSA -patient decline to use CPAP and expressed that due to claustrophobia she had not been very compliant with these at home. -education about importance using machine provided and explained as well potential complications of not using it (like pulmonary HTN, right heart failure and arrhythmias) -Patient planning to do better after discharge.  6-GERD -continue PPI  7-acute on chronic renal failure: stage 3 at baseline -Cr trending down (3.2) and inflated close to her baseline (creatinine 2.8 - 2.9). -will continue holding/minimizing nephrotoxic agents (she will hold Benicar to follow-up with her PCP). -Repeat basic metabolic panel to follow renal function trend  -Advised to keep herself well-hydrated.  8-uncontrolled type  2 diabetes with  nephropathy  -Resume home Levemir and NovoLog (as prescribed by her endocrinologist). -Patient advised to keep herself well-hydrated (mainly drinking water) -And instructed to follow modified carbohydrate diet.  9-HTN -stable overall -continue current antihypertensive regimen -advise to follow heart healthy diet   10-left side musculoskeletal pain/spasm -Continue Tylenol for pain control and as needed use of Robaxin.   Procedures:  See below for x-ray reports   Consultations:  None   Discharge Exam: Vitals:   08/20/17 1405 08/20/17 1410  BP: (!) 145/76   Pulse: 60   Resp: 18   Temp: (!) 97.5 F (36.4 C)   SpO2: 92% 96%     General: Afebrile, no chest pain, no nausea, no vomiting; breathing much better and tolerating diet. Significant improvement in her coughing spells reported.   Cardiovascular: No murmurs, no rubs, no gallops, regular rate and rhythm.  Respiratory: No using accessory muscles, positive scattered rhonchi, mild end expiratory wheezing, improved air movement bilaterally.  Abdomen: soft, nontender, nondistended, positive bowel sounds  Musculoskeletal: No edema, no cyanosis, no clubbing.  Discharge Instructions   Discharge Instructions    Diet - low sodium heart healthy   Complete by:  As directed    Diet Carb Modified   Complete by:  As directed    Discharge instructions   Complete by:  As directed    Medication as prescribed Arrange follow-up with PCP in 10 days Keep yourself well-hydrated Remember to hold your Benicar medication until follow-up with PCP. Follow heart healthy diet (watching the amount of sodium and try not to ingest more than 2500 mg/day) and follow modified carbohydrate diet.     Allergies as of 08/20/2017      Reactions   Ace Inhibitors Anaphylaxis, Swelling   Penicillins Itching, Swelling, Other (See Comments)   Has patient had a PCN reaction causing immediate rash, facial/tongue/throat swelling, SOB or  lightheadedness with hypotension: Yes Has patient had a PCN reaction causing severe rash involving mucus membranes or skin necrosis: No Has patient had a PCN reaction that required hospitalization No Has patient had a PCN reaction occurring within the last 10 years: No If all of the above answers are "NO", then may proceed with Cephalosporin use.   Statins Other (See Comments)   elevated LFT's      Medication List    STOP taking these medications   benzonatate 100 MG capsule Commonly known as:  TESSALON     TAKE these medications   acetaminophen 500 MG tablet Commonly known as:  TYLENOL Take 1,000 mg by mouth every 6 (six) hours as needed for moderate pain or headache.   albuterol 108 (90 Base) MCG/ACT inhaler Commonly known as:  PROVENTIL HFA;VENTOLIN HFA Inhale 2 puffs into the lungs every 4 (four) hours as needed for wheezing or shortness of breath.   amLODipine 10 MG tablet Commonly known as:  NORVASC Take 1 tablet (10 mg total) by mouth daily.   atorvastatin 20 MG tablet Commonly known as:  LIPITOR Take 1 tablet (20 mg total) by mouth daily.   azelastine 0.1 % nasal spray Commonly known as:  ASTELIN Place 2 sprays into both nostrils 2 (two) times daily. Use in each nostril as directed   calcitRIOL 0.25 MCG capsule Commonly known as:  ROCALTROL Take 0.25 mcg by mouth every Monday, Wednesday, and Friday.   cloNIDine 0.1 MG tablet Commonly known as:  CATAPRES TAKE 1 TABLET BY MOUTH  EVERY EVENING AT 9PM FOR  BLOOD PRESSURE   dexlansoprazole 60 MG  capsule Commonly known as:  DEXILANT 1 PO EVERY MORNING WITH BREAKFAST.   dextromethorphan-guaiFENesin 30-600 MG 12hr tablet Commonly known as:  MUCINEX DM Take 1 tablet by mouth 2 (two) times daily.   dicyclomine 10 MG capsule Commonly known as:  BENTYL 1 po qachs if needed to prevent diarrhea and abdominal cramps   ezetimibe 10 MG tablet Commonly known as:  ZETIA Take 1 tablet (10 mg total) by mouth daily.    furosemide 40 MG tablet Commonly known as:  LASIX One tablet two times daily, as needed, for leg swelling   gabapentin 100 MG capsule Commonly known as:  NEURONTIN Take 1 capsule (100 mg total) by mouth 3 (three) times daily.   Insulin Detemir 100 UNIT/ML Pen Commonly known as:  LEVEMIR Inject 100 Units into the skin daily at 10 pm.   Insulin Lispro 200 UNIT/ML Sopn Commonly known as:  HUMALOG KWIKPEN Inject 32-36 Units into the skin 3 (three) times daily before meals.   isosorbide mononitrate 30 MG 24 hr tablet Commonly known as:  IMDUR Take 1 tablet (30 mg total) by mouth daily.   labetalol 200 MG tablet Commonly known as:  NORMODYNE Take 2 tablets (400 mg total) by mouth 2 (two) times daily.   levofloxacin 500 MG tablet Commonly known as:  LEVAQUIN Take 1 tablet (500 mg total) by mouth every other day for 3 doses. What changed:  when to take this   methocarbamol 500 MG tablet Commonly known as:  ROBAXIN Take 1 tablet (500 mg total) by mouth every 8 (eight) hours as needed for muscle spasms.   olmesartan 40 MG tablet Commonly known as:  BENICAR Take 1 tablet (40 mg total) by mouth daily. Hold until follow up with PCP What changed:  additional instructions   Vitamin D (Ergocalciferol) 50000 units Caps capsule Commonly known as:  DRISDOL Take 1 capsule (50,000 Units total) by mouth every 7 (seven) days.      Allergies  Allergen Reactions  . Ace Inhibitors Anaphylaxis and Swelling  . Penicillins Itching, Swelling and Other (See Comments)    Has patient had a PCN reaction causing immediate rash, facial/tongue/throat swelling, SOB or lightheadedness with hypotension: Yes Has patient had a PCN reaction causing severe rash involving mucus membranes or skin necrosis: No Has patient had a PCN reaction that required hospitalization No Has patient had a PCN reaction occurring within the last 10 years: No If all of the above answers are "NO", then may proceed with  Cephalosporin use.   . Statins Other (See Comments)    elevated LFT's   Follow-up Information    Fayrene Helper, MD. Schedule an appointment as soon as possible for a visit in 10 day(s).   Specialty:  Family Medicine Contact information: [address], [place] [address] [phone]           The results of significant diagnostics from this hospitalization (including imaging, microbiology, ancillary and laboratory) are listed below for reference.    Significant Diagnostic Studies: Dg Chest 2 View  Result Date: 08/18/2017 CLINICAL DATA:  Shortness of breath, cough, chest soreness EXAM: CHEST - 2 VIEW COMPARISON:  08/17/2017 FINDINGS: Heart is borderline in size. Lingular linear atelectasis or scarring. Right lung clear. No effusions or acute bony abnormality. IMPRESSION: Lingular atelectasis or scarring. Electronically Signed   By: Rolm Baptise M.D.   On: 08/18/2017 11:53   Dg Chest 2 View  Result Date: 08/15/2017 CLINICAL DATA:  Cough, wheezing, SOB today  EXAM: CHEST - 2 VIEW COMPARISON:  03/24/2017 FINDINGS: Ill-defined airspace opacities in the left mid lung and both lung bases left greater than right, increased since prior study. Heart size upper limits normal. No effusion. Visualized bones unremarkable. IMPRESSION: 1. Patchy bibasilar airspace opacities left greater than right, possibly early pneumonia. Consider follow-up to confirm appropriate resolution. Electronically Signed   By: Lucrezia Europe M.D.   On: 08/15/2017 10:42   Ct Chest Wo Contrast  Result Date: 08/18/2017 CLINICAL DATA:  Recent diagnosis of pneumonia with persistent symptoms. Shortness of breath. EXAM: CT CHEST WITHOUT CONTRAST TECHNIQUE: Multidetector CT imaging of the chest was performed following the standard protocol without IV contrast. COMPARISON:  Chest radiography same day. FINDINGS: Cardiovascular: Heart size upper limits of normal. No pericardial fluid. Minimal visible coronary artery  calcification. Mild calcification in the region of the aortic valve and aortic arch. Mediastinum/Nodes: No mass or lymphadenopathy. Lungs/Pleura: The right lung is clear and normal. No pleural fluid on the right. The left lung shows mild patchy density in the left upper lobe, lingula and left lower lobe consistent with mild or resolving pneumonia. There is a small amount of pleural fluid on the left with mild dependent atelectasis. No finding to suggest underlying mass lesion. Upper Abdomen: Bilateral adrenal enlargement. This could represent adrenal hyperplasia or bilateral adrenal masses. These are unchanged from the previous abdominal CT of January 2019. Pancreatic head not included in the region studied. Musculoskeletal: Ordinary thoracic degenerative changes. IMPRESSION: Patchy density and volume loss in the left upper lobe, lingula and left lower lobe with a small left effusion. Findings would be consistent with resolving pneumonia. No specific features. Aortic atherosclerosis.  Mild coronary artery calcification. Bilateral adrenal enlargement redemonstrated, not visibly changed since the abdominal CT of January. Electronically Signed   By: Nelson Chimes M.D.   On: 08/18/2017 14:40    Microbiology: Recent Results (from the past 240 hour(s))  Urine culture     Status: Abnormal   Collection Time: 08/18/17  1:09 PM  Result Value Ref Range Status   Specimen Description   Final    URINE, CLEAN CATCH Performed at [facility], [address], [place], [place] [address]    Special Requests   Final    NONE Performed at [facility], [address] [place], [place] [address]    Culture MULTIPLE SPECIES PRESENT, SUGGEST RECOLLECTION (A)  Final   Report Status 08/20/2017 FINAL  Final  Culture, blood (routine x 2) Call MD if unable to obtain prior to antibiotics being given     Status: None (Preliminary result)   Collection Time: 08/18/17  9:21 PM  Result Value Ref Range Status   Specimen Description  BLOOD LEFT ARM  Final   Special Requests   Final    BOTTLES DRAWN AEROBIC AND ANAEROBIC Blood Culture adequate volume   Culture   Final    NO GROWTH 2 DAYS Performed at [facility], [address], [place], [place] [address]    Report Status PENDING  Incomplete  Culture, blood (routine x 2) Call MD if unable to obtain prior to antibiotics being given     Status: None (Preliminary result)   Collection Time: 08/18/17  9:27 PM  Result Value Ref Range Status   Specimen Description BLOOD LEFT ARM  Final   Special Requests   Final    BOTTLES DRAWN AEROBIC AND ANAEROBIC Blood Culture adequate volume   Culture   Final    NO GROWTH 2 DAYS Performed  at [facility], [address], [place], [place] [address]    Report Status PENDING  Incomplete     Labs: Basic Metabolic Panel: Recent Labs  Lab 08/15/17 1018 08/18/17 1210 08/19/17 0606 08/20/17 0825  NA 138 138 143 145  K 3.9 4.1 3.6 4.0  CL 103 106 111 119*  CO2 25 22 22  21*  GLUCOSE 445* 509* 239* 133*  BUN 28* 39* 38* 35*  CREATININE 2.80* 3.73* 3.49* 3.28*  CALCIUM 8.5* 8.1* 7.8* 8.0*  MG  --  1.4*  --   --   PHOS  --  3.8  --   --    Liver Function Tests: Recent Labs  Lab 08/15/17 1018  AST 17  ALT 23  ALKPHOS 98  BILITOT 0.6  PROT 6.6  ALBUMIN 2.5*   CBC: Recent Labs  Lab 08/15/17 1018 08/18/17 1210 08/19/17 0606  WBC 9.4 8.9 9.8  NEUTROABS 6.2 7.1  --   HGB 9.5* 8.4* 7.9*  HCT 31.7* 28.2* 26.4*  MCV 73.5* 73.1* 73.1*  PLT 492* 469* 451*   Cardiac Enzymes: Recent Labs  Lab 08/18/17 1210  TROPONINI <0.03   BNP: BNP (last 3 results) Recent Labs    02/14/17 1122 08/15/17 1018 08/18/17 1211  BNP 51.0 198.0* 77.0    CBG: Recent Labs  Lab 08/19/17 1127 08/19/17 1654 08/19/17 2124 08/20/17 0718 08/20/17 1139  GLUCAP 219* 169* 171* 132* 190*    Signed:  Barton Dubois MD.  [facility] 08/20/2017, 4:53 PM

## 2017-08-20 NOTE — Progress Notes (Signed)
IV removed and discharge instructions reviewed with patient and daughter.  FU appointments made and scripts given

## 2017-08-23 ENCOUNTER — Telehealth: Payer: Self-pay

## 2017-08-23 ENCOUNTER — Ambulatory Visit (INDEPENDENT_AMBULATORY_CARE_PROVIDER_SITE_OTHER): Payer: 59 | Admitting: Family Medicine

## 2017-08-23 ENCOUNTER — Encounter: Payer: Self-pay | Admitting: Family Medicine

## 2017-08-23 VITALS — BP 170/84 | HR 102 | Resp 16 | Ht 64.0 in | Wt 226.0 lb

## 2017-08-23 DIAGNOSIS — Z09 Encounter for follow-up examination after completed treatment for conditions other than malignant neoplasm: Secondary | ICD-10-CM

## 2017-08-23 DIAGNOSIS — D3500 Benign neoplasm of unspecified adrenal gland: Secondary | ICD-10-CM

## 2017-08-23 DIAGNOSIS — J189 Pneumonia, unspecified organism: Secondary | ICD-10-CM

## 2017-08-23 DIAGNOSIS — I10 Essential (primary) hypertension: Secondary | ICD-10-CM

## 2017-08-23 LAB — CBC
HEMATOCRIT: 26.6 % — AB (ref 35.0–45.0)
HEMOGLOBIN: 8 g/dL — AB (ref 11.7–15.5)
MCH: 21.6 pg — AB (ref 27.0–33.0)
MCHC: 30.1 g/dL — ABNORMAL LOW (ref 32.0–36.0)
MCV: 71.9 fL — AB (ref 80.0–100.0)
MPV: 10.5 fL (ref 7.5–12.5)
PLATELETS: 455 10*3/uL — AB (ref 140–400)
RBC: 3.7 10*6/uL — AB (ref 3.80–5.10)
RDW: 16.1 % — ABNORMAL HIGH (ref 11.0–15.0)
WBC: 9.1 10*3/uL (ref 3.8–10.8)

## 2017-08-23 LAB — CULTURE, BLOOD (ROUTINE X 2)
CULTURE: NO GROWTH
Culture: NO GROWTH
SPECIAL REQUESTS: ADEQUATE
Special Requests: ADEQUATE

## 2017-08-23 LAB — BASIC METABOLIC PANEL WITH GFR
BUN/Creatinine Ratio: 10 (calc) (ref 6–22)
BUN: 36 mg/dL — AB (ref 7–25)
CHLORIDE: 110 mmol/L (ref 98–110)
CO2: 26 mmol/L (ref 20–32)
CREATININE: 3.74 mg/dL — AB (ref 0.50–0.99)
Calcium: 8.2 mg/dL — ABNORMAL LOW (ref 8.6–10.4)
GFR, EST AFRICAN AMERICAN: 14 mL/min/{1.73_m2} — AB (ref >=60)
GFR, EST NON AFRICAN AMERICAN: 12 mL/min/{1.73_m2} — AB (ref >=60)
Glucose, Bld: 352 mg/dL — ABNORMAL HIGH (ref 65–139)
POTASSIUM: 4.2 mmol/L (ref 3.5–5.3)
SODIUM: 145 mmol/L (ref 135–146)

## 2017-08-23 NOTE — Patient Instructions (Addendum)
F/U in August as before, call if you need me sooner  Labs today CBC , chem 7 and EGFR  Please complete antibiotic  CXR is ordered for first week in August 5 or after  I will tell endocrine and kidney Doctor about scan and adrenal; glands  Please complete the antibiotic course  We will send for [facility] d/c summary  OK to use the inhaler  Need to return  for appt with Dr Birdie Sons after she gets labs today

## 2017-08-23 NOTE — Telephone Encounter (Signed)
Transition Care Management Follow-up Telephone Call   Date discharged?  08/20/17              How have you been since you were released from the hospital? Tired   Do you understand why you were in the hospital? Yes, pneumonia   Do you understand the discharge instructions? yes   Where were you discharged to? home   Items Reviewed:  Medications reviewed: yes  Allergies reviewed: yes  Dietary changes reviewed: yes  Referrals reviewed: yes, none made   Functional Questionnaire:   Activities of Daily Living (ADLs): has help at home    Any transportation issues/concerns?: no   Any patient concerns? no   Confirmed importance and date/time of follow-up visits scheduled yes, patient was seen in the office today     Confirmed with patient if condition begins to worsen call PCP or go to the ER.  Patient was given the office number and encouraged to call back with question or concerns. Yes with verbal understanding

## 2017-08-25 ENCOUNTER — Encounter (HOSPITAL_COMMUNITY): Payer: Self-pay

## 2017-08-27 ENCOUNTER — Encounter (HOSPITAL_COMMUNITY): Payer: Self-pay

## 2017-08-31 ENCOUNTER — Encounter (HOSPITAL_COMMUNITY): Payer: Self-pay

## 2017-08-31 ENCOUNTER — Ambulatory Visit: Payer: Self-pay | Admitting: Family Medicine

## 2017-08-31 ENCOUNTER — Emergency Department (HOSPITAL_COMMUNITY): Payer: 59

## 2017-08-31 ENCOUNTER — Other Ambulatory Visit: Payer: Self-pay

## 2017-08-31 ENCOUNTER — Inpatient Hospital Stay (HOSPITAL_COMMUNITY)
Admission: EM | Admit: 2017-08-31 | Discharge: 2017-09-08 | DRG: 264 | Disposition: A | Discharge status: Skilled Nursing Facility | Payer: 59 | Attending: Family Medicine | Admitting: Family Medicine

## 2017-08-31 DIAGNOSIS — E1121 Type 2 diabetes mellitus with diabetic nephropathy: Secondary | ICD-10-CM | POA: Diagnosis present

## 2017-08-31 DIAGNOSIS — D638 Anemia in other chronic diseases classified elsewhere: Secondary | ICD-10-CM | POA: Diagnosis present

## 2017-08-31 DIAGNOSIS — M549 Dorsalgia, unspecified: Secondary | ICD-10-CM | POA: Diagnosis present

## 2017-08-31 DIAGNOSIS — I259 Chronic ischemic heart disease, unspecified: Secondary | ICD-10-CM | POA: Diagnosis present

## 2017-08-31 DIAGNOSIS — Z6835 Body mass index (BMI) 35.0-35.9, adult: Secondary | ICD-10-CM | POA: Diagnosis not present

## 2017-08-31 DIAGNOSIS — Z880 Allergy status to penicillin: Secondary | ICD-10-CM

## 2017-08-31 DIAGNOSIS — D509 Iron deficiency anemia, unspecified: Secondary | ICD-10-CM | POA: Diagnosis present

## 2017-08-31 DIAGNOSIS — R5381 Other malaise: Secondary | ICD-10-CM | POA: Diagnosis present

## 2017-08-31 DIAGNOSIS — I5033 Acute on chronic diastolic (congestive) heart failure: Secondary | ICD-10-CM | POA: Diagnosis present

## 2017-08-31 DIAGNOSIS — K219 Gastro-esophageal reflux disease without esophagitis: Secondary | ICD-10-CM | POA: Diagnosis present

## 2017-08-31 DIAGNOSIS — R609 Edema, unspecified: Secondary | ICD-10-CM | POA: Diagnosis not present

## 2017-08-31 DIAGNOSIS — E8809 Other disorders of plasma-protein metabolism, not elsewhere classified: Secondary | ICD-10-CM | POA: Diagnosis present

## 2017-08-31 DIAGNOSIS — I5032 Chronic diastolic (congestive) heart failure: Secondary | ICD-10-CM | POA: Diagnosis not present

## 2017-08-31 DIAGNOSIS — I509 Heart failure, unspecified: Secondary | ICD-10-CM

## 2017-08-31 DIAGNOSIS — Z01810 Encounter for preprocedural cardiovascular examination: Secondary | ICD-10-CM | POA: Diagnosis not present

## 2017-08-31 DIAGNOSIS — N179 Acute kidney failure, unspecified: Secondary | ICD-10-CM

## 2017-08-31 DIAGNOSIS — IMO0002 Reserved for concepts with insufficient information to code with codable children: Secondary | ICD-10-CM

## 2017-08-31 DIAGNOSIS — E11649 Type 2 diabetes mellitus with hypoglycemia without coma: Secondary | ICD-10-CM | POA: Diagnosis present

## 2017-08-31 DIAGNOSIS — N186 End stage renal disease: Secondary | ICD-10-CM

## 2017-08-31 DIAGNOSIS — G4730 Sleep apnea, unspecified: Secondary | ICD-10-CM | POA: Diagnosis present

## 2017-08-31 DIAGNOSIS — E1122 Type 2 diabetes mellitus with diabetic chronic kidney disease: Secondary | ICD-10-CM

## 2017-08-31 DIAGNOSIS — N2581 Secondary hyperparathyroidism of renal origin: Secondary | ICD-10-CM | POA: Diagnosis present

## 2017-08-31 DIAGNOSIS — Z79899 Other long term (current) drug therapy: Secondary | ICD-10-CM

## 2017-08-31 DIAGNOSIS — E119 Type 2 diabetes mellitus without complications: Secondary | ICD-10-CM | POA: Diagnosis not present

## 2017-08-31 DIAGNOSIS — D631 Anemia in chronic kidney disease: Secondary | ICD-10-CM | POA: Diagnosis present

## 2017-08-31 DIAGNOSIS — E782 Mixed hyperlipidemia: Secondary | ICD-10-CM | POA: Diagnosis present

## 2017-08-31 DIAGNOSIS — D649 Anemia, unspecified: Secondary | ICD-10-CM | POA: Diagnosis present

## 2017-08-31 DIAGNOSIS — R630 Anorexia: Secondary | ICD-10-CM | POA: Diagnosis present

## 2017-08-31 DIAGNOSIS — G4733 Obstructive sleep apnea (adult) (pediatric): Secondary | ICD-10-CM | POA: Diagnosis present

## 2017-08-31 DIAGNOSIS — N185 Chronic kidney disease, stage 5: Secondary | ICD-10-CM | POA: Diagnosis present

## 2017-08-31 DIAGNOSIS — N184 Chronic kidney disease, stage 4 (severe): Secondary | ICD-10-CM | POA: Diagnosis present

## 2017-08-31 DIAGNOSIS — E1165 Type 2 diabetes mellitus with hyperglycemia: Secondary | ICD-10-CM | POA: Diagnosis present

## 2017-08-31 DIAGNOSIS — Z794 Long term (current) use of insulin: Secondary | ICD-10-CM | POA: Diagnosis not present

## 2017-08-31 DIAGNOSIS — I5030 Unspecified diastolic (congestive) heart failure: Secondary | ICD-10-CM | POA: Diagnosis not present

## 2017-08-31 DIAGNOSIS — I132 Hypertensive heart and chronic kidney disease with heart failure and with stage 5 chronic kidney disease, or end stage renal disease: Principal | ICD-10-CM | POA: Diagnosis present

## 2017-08-31 DIAGNOSIS — R262 Difficulty in walking, not elsewhere classified: Secondary | ICD-10-CM | POA: Diagnosis present

## 2017-08-31 DIAGNOSIS — E785 Hyperlipidemia, unspecified: Secondary | ICD-10-CM | POA: Diagnosis present

## 2017-08-31 DIAGNOSIS — Z888 Allergy status to other drugs, medicaments and biological substances status: Secondary | ICD-10-CM

## 2017-08-31 DIAGNOSIS — Z9119 Patient's noncompliance with other medical treatment and regimen: Secondary | ICD-10-CM

## 2017-08-31 HISTORY — DX: Unspecified diastolic (congestive) heart failure: I50.30

## 2017-08-31 LAB — URINALYSIS, ROUTINE W REFLEX MICROSCOPIC
Bilirubin Urine: NEGATIVE
Glucose, UA: >=500 mg/dL — AB
Ketones, ur: NEGATIVE mg/dL
Leukocytes, UA: NEGATIVE
NITRITE: NEGATIVE
SPECIFIC GRAVITY, URINE: 1.016 (ref 1.005–1.030)
pH: 5 (ref 5.0–8.0)

## 2017-08-31 LAB — HEPATIC FUNCTION PANEL
ALBUMIN: 2 g/dL — AB (ref 3.5–5.0)
ALT: 18 U/L (ref 14–54)
AST: 16 U/L (ref 15–41)
Alkaline Phosphatase: 94 U/L (ref 38–126)
BILIRUBIN TOTAL: 0.5 mg/dL (ref 0.3–1.2)
Bilirubin, Direct: <0.1 mg/dL — ABNORMAL LOW (ref 0.1–0.5)
Total Protein: 6.3 g/dL — ABNORMAL LOW (ref 6.5–8.1)

## 2017-08-31 LAB — I-STAT CHEM 8, ED
BUN: 42 mg/dL — AB (ref 6–20)
CALCIUM ION: 1.15 mmol/L (ref 1.15–1.40)
Chloride: 106 mmol/L (ref 101–111)
Creatinine, Ser: 4 mg/dL — ABNORMAL HIGH (ref 0.44–1.00)
GLUCOSE: 208 mg/dL — AB (ref 65–99)
HCT: 25 % — ABNORMAL LOW (ref 36.0–46.0)
Hemoglobin: 8.5 g/dL — ABNORMAL LOW (ref 12.0–15.0)
Potassium: 3.7 mmol/L (ref 3.5–5.1)
SODIUM: 144 mmol/L (ref 135–145)
TCO2: 25 mmol/L (ref 22–32)

## 2017-08-31 LAB — CBC
HCT: 26.5 % — ABNORMAL LOW (ref 36.0–46.0)
Hemoglobin: 7.7 g/dL — ABNORMAL LOW (ref 12.0–15.0)
MCH: 21.5 pg — AB (ref 26.0–34.0)
MCHC: 29.1 g/dL — ABNORMAL LOW (ref 30.0–36.0)
MCV: 74 fL — ABNORMAL LOW (ref 78.0–100.0)
PLATELETS: 403 10*3/uL — AB (ref 150–400)
RBC: 3.58 MIL/uL — AB (ref 3.87–5.11)
RDW: 15.9 % — AB (ref 11.5–15.5)
WBC: 14 10*3/uL — AB (ref 4.0–10.5)

## 2017-08-31 LAB — BRAIN NATRIURETIC PEPTIDE: B NATRIURETIC PEPTIDE 5: 84.5 pg/mL (ref 0.0–100.0)

## 2017-08-31 LAB — BASIC METABOLIC PANEL
Anion gap: 12 (ref 5–15)
BUN: 42 mg/dL — ABNORMAL HIGH (ref 6–20)
CALCIUM: 8.8 mg/dL — AB (ref 8.9–10.3)
CO2: 26 mmol/L (ref 22–32)
CREATININE: 4.32 mg/dL — AB (ref 0.44–1.00)
Chloride: 107 mmol/L (ref 101–111)
GFR calc non Af Amer: 10 mL/min — ABNORMAL LOW (ref >60)
GFR, EST AFRICAN AMERICAN: 12 mL/min — AB (ref >60)
Glucose, Bld: 202 mg/dL — ABNORMAL HIGH (ref 65–99)
Potassium: 3.7 mmol/L (ref 3.5–5.1)
Sodium: 145 mmol/L (ref 135–145)

## 2017-08-31 LAB — GLUCOSE, CAPILLARY
GLUCOSE-CAPILLARY: 78 mg/dL (ref 65–99)
Glucose-Capillary: 58 mg/dL — ABNORMAL LOW (ref 65–99)

## 2017-08-31 LAB — I-STAT TROPONIN, ED: TROPONIN I, POC: 0.04 ng/mL (ref 0.00–0.08)

## 2017-08-31 LAB — CBG MONITORING, ED: Glucose-Capillary: 85 mg/dL (ref 65–99)

## 2017-08-31 LAB — PHOSPHORUS: PHOSPHORUS: 4.4 mg/dL (ref 2.5–4.6)

## 2017-08-31 MED ORDER — SODIUM CHLORIDE 0.9% FLUSH
3.0000 mL | INTRAVENOUS | Status: DC | PRN
  Administered 2017-09-06: 3 mL via INTRAVENOUS
  Filled 2017-08-31: qty 3

## 2017-08-31 MED ORDER — ENOXAPARIN SODIUM 30 MG/0.3ML ~~LOC~~ SOLN
30.0000 mg | SUBCUTANEOUS | Status: DC
  Administered 2017-08-31 – 2017-09-07 (×8): 30 mg via SUBCUTANEOUS
  Filled 2017-08-31 (×9): qty 0.3

## 2017-08-31 MED ORDER — INSULIN DETEMIR 100 UNIT/ML ~~LOC~~ SOLN
100.0000 [IU] | Freq: Every day | SUBCUTANEOUS | Status: DC
  Filled 2017-08-31: qty 1

## 2017-08-31 MED ORDER — ALBUTEROL SULFATE (2.5 MG/3ML) 0.083% IN NEBU: 3.0000 mL | INHALATION_SOLUTION | RESPIRATORY_TRACT | Status: DC | PRN

## 2017-08-31 MED ORDER — ISOSORBIDE MONONITRATE ER 30 MG PO TB24
30.0000 mg | ORAL_TABLET | Freq: Every day | ORAL | Status: DC
  Administered 2017-09-01 – 2017-09-08 (×8): 30 mg via ORAL
  Filled 2017-08-31 (×8): qty 1

## 2017-08-31 MED ORDER — GABAPENTIN 100 MG PO CAPS
100.0000 mg | ORAL_CAPSULE | Freq: Three times a day (TID) | ORAL | Status: DC
  Administered 2017-08-31 – 2017-09-08 (×25): 100 mg via ORAL
  Filled 2017-08-31 (×25): qty 1

## 2017-08-31 MED ORDER — PANTOPRAZOLE SODIUM 40 MG PO TBEC
40.0000 mg | DELAYED_RELEASE_TABLET | Freq: Every day | ORAL | Status: DC
  Administered 2017-09-01 – 2017-09-08 (×8): 40 mg via ORAL
  Filled 2017-08-31 (×8): qty 1

## 2017-08-31 MED ORDER — CALCIUM CARBONATE ANTACID 1250 MG/5ML PO SUSP
500.0000 mg | Freq: Four times a day (QID) | ORAL | Status: DC | PRN
  Filled 2017-08-31: qty 5

## 2017-08-31 MED ORDER — SORBITOL 70 % SOLN
30.0000 mL | Status: DC | PRN
  Filled 2017-08-31: qty 30

## 2017-08-31 MED ORDER — EZETIMIBE 10 MG PO TABS
10.0000 mg | ORAL_TABLET | Freq: Every day | ORAL | Status: DC
  Administered 2017-09-01 – 2017-09-08 (×8): 10 mg via ORAL
  Filled 2017-08-31 (×8): qty 1

## 2017-08-31 MED ORDER — ACETAMINOPHEN 325 MG PO TABS
650.0000 mg | ORAL_TABLET | ORAL | Status: DC | PRN
  Administered 2017-08-31 – 2017-09-08 (×8): 650 mg via ORAL
  Filled 2017-08-31 (×8): qty 2

## 2017-08-31 MED ORDER — SODIUM CHLORIDE 0.9 % IV SOLN
510.0000 mg | Freq: Once | INTRAVENOUS | Status: AC
  Administered 2017-09-01: 510 mg via INTRAVENOUS
  Filled 2017-08-31: qty 17

## 2017-08-31 MED ORDER — CAMPHOR-MENTHOL 0.5-0.5 % EX LOTN
1.0000 "application " | TOPICAL_LOTION | Freq: Three times a day (TID) | CUTANEOUS | Status: DC | PRN
  Filled 2017-08-31: qty 222

## 2017-08-31 MED ORDER — SODIUM CHLORIDE 0.9 % IV SOLN
INTRAVENOUS | Status: DC
  Administered 2017-08-31: 18:00:00 via INTRAVENOUS

## 2017-08-31 MED ORDER — SODIUM CHLORIDE 0.9% FLUSH
3.0000 mL | Freq: Two times a day (BID) | INTRAVENOUS | Status: DC
  Administered 2017-08-31 – 2017-09-08 (×15): 3 mL via INTRAVENOUS

## 2017-08-31 MED ORDER — SODIUM CHLORIDE 0.9 % IV SOLN: 250.0000 mL | INTRAVENOUS | Status: DC | PRN

## 2017-08-31 MED ORDER — CALCITRIOL 0.25 MCG PO CAPS
0.2500 ug | ORAL_CAPSULE | ORAL | Status: DC
  Administered 2017-09-01 – 2017-09-08 (×4): 0.25 ug via ORAL
  Filled 2017-08-31 (×4): qty 1

## 2017-08-31 MED ORDER — CLONIDINE HCL 0.1 MG PO TABS
0.1000 mg | ORAL_TABLET | ORAL | Status: DC
  Administered 2017-08-31 – 2017-09-08 (×9): 0.1 mg via ORAL
  Filled 2017-08-31 (×9): qty 1

## 2017-08-31 MED ORDER — ASPIRIN EC 81 MG PO TBEC
81.0000 mg | DELAYED_RELEASE_TABLET | Freq: Every day | ORAL | Status: DC
  Administered 2017-09-01 – 2017-09-08 (×8): 81 mg via ORAL
  Filled 2017-08-31 (×8): qty 1

## 2017-08-31 MED ORDER — ATORVASTATIN CALCIUM 20 MG PO TABS
20.0000 mg | ORAL_TABLET | Freq: Every day | ORAL | Status: DC
  Administered 2017-09-01 – 2017-09-08 (×8): 20 mg via ORAL
  Filled 2017-08-31 (×8): qty 1

## 2017-08-31 MED ORDER — INSULIN ASPART 100 UNIT/ML ~~LOC~~ SOLN
0.0000 [IU] | Freq: Three times a day (TID) | SUBCUTANEOUS | Status: DC
  Administered 2017-09-01: 5 [IU] via SUBCUTANEOUS
  Administered 2017-09-01: 2 [IU] via SUBCUTANEOUS
  Administered 2017-09-02: 8 [IU] via SUBCUTANEOUS
  Administered 2017-09-03: 15 [IU] via SUBCUTANEOUS
  Administered 2017-09-03: 8 [IU] via SUBCUTANEOUS
  Administered 2017-09-03: 5 [IU] via SUBCUTANEOUS
  Administered 2017-09-04 (×2): 8 [IU] via SUBCUTANEOUS
  Administered 2017-09-04: 5 [IU] via SUBCUTANEOUS
  Administered 2017-09-05 (×3): 3 [IU] via SUBCUTANEOUS
  Administered 2017-09-06: 5 [IU] via SUBCUTANEOUS
  Administered 2017-09-06: 3 [IU] via SUBCUTANEOUS
  Administered 2017-09-06: 5 [IU] via SUBCUTANEOUS
  Administered 2017-09-07 (×2): 2 [IU] via SUBCUTANEOUS

## 2017-08-31 MED ORDER — LABETALOL HCL 200 MG PO TABS
400.0000 mg | ORAL_TABLET | Freq: Two times a day (BID) | ORAL | Status: DC
  Administered 2017-08-31 – 2017-09-08 (×17): 400 mg via ORAL
  Filled 2017-08-31 (×17): qty 2

## 2017-08-31 MED ORDER — METHOCARBAMOL 500 MG PO TABS
500.0000 mg | ORAL_TABLET | Freq: Three times a day (TID) | ORAL | Status: DC | PRN
  Administered 2017-09-06 – 2017-09-07 (×2): 500 mg via ORAL
  Filled 2017-08-31 (×2): qty 1

## 2017-08-31 MED ORDER — NEPRO/CARBSTEADY PO LIQD
237.0000 mL | Freq: Three times a day (TID) | ORAL | Status: DC | PRN
  Filled 2017-08-31: qty 237

## 2017-08-31 MED ORDER — HYDROXYZINE HCL 25 MG PO TABS: 25.0000 mg | ORAL_TABLET | Freq: Three times a day (TID) | ORAL | Status: DC | PRN

## 2017-08-31 MED ORDER — ZOLPIDEM TARTRATE 5 MG PO TABS
5.0000 mg | ORAL_TABLET | Freq: Every evening | ORAL | Status: DC | PRN
  Administered 2017-09-06 – 2017-09-07 (×2): 5 mg via ORAL
  Filled 2017-08-31 (×2): qty 1

## 2017-08-31 MED ORDER — DOCUSATE SODIUM 283 MG RE ENEM
1.0000 | ENEMA | RECTAL | Status: DC | PRN
  Filled 2017-08-31: qty 1

## 2017-08-31 MED ORDER — FUROSEMIDE 10 MG/ML IJ SOLN
120.0000 mg | Freq: Three times a day (TID) | INTRAVENOUS | Status: DC
  Administered 2017-08-31 – 2017-09-03 (×8): 120 mg via INTRAVENOUS
  Filled 2017-08-31 (×5): qty 12
  Filled 2017-08-31: qty 2
  Filled 2017-08-31: qty 12
  Filled 2017-08-31: qty 10
  Filled 2017-08-31: qty 12
  Filled 2017-08-31: qty 10
  Filled 2017-08-31: qty 12
  Filled 2017-08-31: qty 10

## 2017-08-31 MED ORDER — ONDANSETRON HCL 4 MG/2ML IJ SOLN: 4.0000 mg | Freq: Four times a day (QID) | INTRAMUSCULAR | Status: DC | PRN

## 2017-08-31 MED ORDER — INSULIN ASPART 100 UNIT/ML ~~LOC~~ SOLN
0.0000 [IU] | Freq: Every day | SUBCUTANEOUS | Status: DC
  Administered 2017-09-01: 3 [IU] via SUBCUTANEOUS
  Administered 2017-09-02 – 2017-09-03 (×2): 4 [IU] via SUBCUTANEOUS

## 2017-08-31 NOTE — Progress Notes (Signed)
Hypoglycemic Event  CBG: 58  Treatment: 15 GM carbohydrate snack  Symptoms: None  Follow-up CBG: Time:2316 CBG Result:78  Possible Reasons for Event: Inadequate meal intake  Comments/MD notified:per protocol, K. Schorr    Branda Chaudhary L

## 2017-08-31 NOTE — ED Triage Notes (Signed)
[facility]- pt was d/c last Friday from [facility] diagnosed with pneumonia. Pt states she has been having bilateral leg/foot soreness. Swelling noted to the legs. Pt has increased her lasix and takes 120mg  in the AM and 80mg  at night. Vitals stable with EMS.

## 2017-08-31 NOTE — ED Provider Notes (Addendum)
[facility] EMERGENCY DEPARTMENT Provider Note   CSN: [id number] Arrival date & time: 08/31/17  1329     History   Chief Complaint Chief Complaint  Patient presents with  . Shortness of Breath    HPI Kathryn Beck is a 62 y.o. female hx of CKD, HTN, diastolic heart failure, here presenting with shortness of breath.  Patient was admitted to the hospital about a week ago with pneumonia.  Patient was given IV fluids as well as Levaquin.  Patient just finished a course of Levaquin.  Patient states that for the last 3 to 4 days, she has worsening leg swelling and shortness of breath.  She went to [facility] 3 days ago and was told that her kidney function is at 15%.  She continues to take Lasix 120 mg in the morning and 80 mg in the evening.  Denies any persistent cough or fevers.  The history is provided by the patient.    Past Medical History:  Diagnosis Date  . Acid reflux   . Anemia   . Arthritis   . Axillary masses    Soft tissue - status post excision  . Back pain   . Chronic kidney disease   . Essential hypertension   . Ischemic heart disease    Abnormal Myoview April 2018 - medical therapy  . Mixed hyperlipidemia   . Obesity   . Pancreatitis   . Sleep apnea    Noncompliant with CPAP  . Type 2 diabetes mellitus, uncontrolled ([place])     Patient Active Problem List   Diagnosis Date Noted  . Shortness of breath   . Community acquired pneumonia 08/18/2017  . Uncontrolled type 2 diabetes mellitus with nephropathy ([place]) 08/18/2017  . Acute renal failure superimposed on stage 3 chronic kidney disease ([place]) 08/18/2017  . Unsteady gait 07/11/2017  . Recurrent falls 07/11/2017  . Anemia of chronic disease 03/24/2017  . History of colonic polyps 02/10/2017  . Dysphagia 11/05/2016  . IBS (irritable bowel syndrome) 02/04/2016  . Personal history of noncompliance with medical treatment, presenting hazards to health 10/21/2015  . Postprandial  epigastric pain   . Heat sensitivity 10/07/2014  . Cardiac murmur 01/17/2014  . Hyperuricemia 08/04/2013  . Seasonal allergies 03/10/2013  . Lipoma of back 12/22/2012  . GERD (gastroesophageal reflux disease) 08/22/2012  . Sleep apnea 06/02/2011  . CKD (chronic kidney disease) stage 4, GFR 15-29 ml/min ([facility]) 01/13/2011  . BACK PAIN WITH RADICULOPATHY 04/16/2010  . FATIGUE 07/24/2009  . LIVER FUNCTION TESTS, ABNORMAL, HX OF 05/08/2009  . LUPUS ERYTHEMATOSUS, DISCOID 06/05/2008  . Uncontrolled diabetes mellitus with diabetic nephropathy, with long-term current use of insulin ([place]) 06/07/2007  . HLD (hyperlipidemia) 06/07/2007  . Obesity 06/07/2007  . Malignant hypertension 06/07/2007    Past Surgical History:  Procedure Laterality Date  . ABDOMINAL HYSTERECTOMY    . COLONOSCOPY  2008   Dr. Oneida Alar: normal   . COLONOSCOPY N/A 12/18/2016   Dr. Oneida Alar: multiple tubular adenomas, internal hemorrhoids. Surveillance in 3 years   . ESOPHAGEAL DILATION N/A 10/13/2015   Procedure: ESOPHAGEAL DILATION;  Surgeon: Rogene Houston, MD;  Location: [facility];  Service: Endoscopy;  Laterality: N/A;  . ESOPHAGOGASTRODUODENOSCOPY N/A 10/13/2015   Dr. Laural Golden: chronic gastritis on path, no H.pylori. Empiric dilation   . ESOPHAGOGASTRODUODENOSCOPY N/A 12/18/2016   Dr. Oneida Alar: mild gastritis. BRAVO study revealed uncontrolled GERD. Dysphagia secondary to uncontrolled reflux  . FOOT SURGERY Bilateral   . LUNG BIOPSY    .  MASS EXCISION Right 01/09/2013   Procedure: EXCISION OF NEOPLASM OF RIGHT  AXILLA  AND EXCISION OF NEOPLASM OF LEFT AXILLA;  Surgeon: Jamesetta So, MD;  Location: [facility] ORS;  Service: General;  Laterality: Right;  procedure end @ 08:23  . MYRINGOTOMY WITH TUBE PLACEMENT Bilateral 04/28/2017   Procedure: BILATERAL MYRINGOTOMY WITH TUBE PLACEMENT;  Surgeon: Leta Baptist, MD;  Location: [facility];  Service: ENT;  Laterality: Bilateral;  . SAVORY DILATION N/A 12/18/2016   Procedure: SAVORY DILATION;   Surgeon: Danie Binder, MD;  Location: [facility];  Service: Endoscopy;  Laterality: N/A;     OB History   None      Home Medications    Prior to Admission medications   Medication Sig Start Date End Date Taking? Authorizing Provider  acetaminophen (TYLENOL) 500 MG tablet Take 1,000 mg by mouth every 6 (six) hours as needed for moderate pain or headache.    [provider]  albuterol (PROVENTIL HFA;VENTOLIN HFA) 108 (90 Base) MCG/ACT inhaler Inhale 2 puffs into the lungs every 4 (four) hours as needed for wheezing or shortness of breath. 08/15/17   Noemi Chapel, MD  amLODipine (NORVASC) 10 MG tablet Take 1 tablet (10 mg total) by mouth daily. 05/31/17   Fayrene Helper, MD  atorvastatin (LIPITOR) 20 MG tablet Take 1 tablet (20 mg total) by mouth daily. 05/31/17   Fayrene Helper, MD  azelastine (ASTELIN) 0.1 % nasal spray Place 2 sprays into both nostrils 2 (two) times daily. Use in each nostril as directed 05/31/17   Fayrene Helper, MD  calcitRIOL (ROCALTROL) 0.25 MCG capsule Take 0.25 mcg by mouth every Monday, Wednesday, and Friday.  09/15/16   Fayrene Helper, MD  cloNIDine (CATAPRES) 0.1 MG tablet TAKE 1 TABLET BY MOUTH  EVERY EVENING AT 9PM FOR  BLOOD PRESSURE 05/31/17   Fayrene Helper, MD  dexlansoprazole (DEXILANT) 60 MG capsule 1 PO EVERY MORNING WITH BREAKFAST. 04/15/17   Fields, Marga Melnick, MD  dextromethorphan-guaiFENesin (MUCINEX DM) 30-600 MG 12hr tablet Take 1 tablet by mouth 2 (two) times daily. 08/20/17   Barton Dubois, MD  dicyclomine (BENTYL) 10 MG capsule 1 po qachs if needed to prevent diarrhea and abdominal cramps 12/18/16   Fields, Marga Melnick, MD  ezetimibe (ZETIA) 10 MG tablet Take 1 tablet (10 mg total) by mouth daily. 05/31/17   Fayrene Helper, MD  furosemide (LASIX) 40 MG tablet One tablet two times daily, as needed, for leg swelling 07/11/17   Fayrene Helper, MD  gabapentin (NEURONTIN) 100 MG capsule Take 1 capsule (100 mg total) by  mouth 3 (three) times daily. 07/05/17   Edrick Kins, DPM  Insulin Detemir (LEVEMIR) 100 UNIT/ML Pen Inject 100 Units into the skin daily at 10 pm. 03/19/17   [name], MD  Insulin Lispro (HUMALOG KWIKPEN) 200 UNIT/ML SOPN Inject 32-36 Units into the skin 3 (three) times daily before meals. 06/18/17   [name], MD  isosorbide mononitrate (IMDUR) 30 MG 24 hr tablet Take 1 tablet (30 mg total) by mouth daily. 05/31/17   Fayrene Helper, MD  labetalol (NORMODYNE) 200 MG tablet Take 2 tablets (400 mg total) by mouth 2 (two) times daily. 05/31/17   Fayrene Helper, MD  methocarbamol (ROBAXIN) 500 MG tablet Take 1 tablet (500 mg total) by mouth every 8 (eight) hours as needed for muscle spasms. 08/20/17   Barton Dubois, MD  olmesartan (BENICAR) 40 MG tablet Take 1 tablet (40 mg  total) by mouth daily. Hold until follow up with PCP 08/20/17   Barton Dubois, MD  Vitamin D, Ergocalciferol, (DRISDOL) 50000 units CAPS capsule Take 1 capsule (50,000 Units total) by mouth every 7 (seven) days. 07/08/17   Fayrene Helper, MD  FLUoxetine (PROZAC) 10 MG capsule Take 10 mg by mouth daily.    05/28/11  [provider]  glipiZIDE (GLUCOTROL) 10 MG tablet Take 10 mg by mouth 2 (two) times daily before a meal.    05/28/11  [provider]    Family History Family History  Problem Relation Age of Onset  . Hypertension Father   . Hypercholesterolemia Father   . Arthritis Father   . Hypertension Sister   . Hypercholesterolemia Sister   . Breast cancer Sister   . Hypertension Sister   . Colon cancer Neg Hx   . Colon polyps Neg Hx     Social History Social History   Tobacco Use  . Smoking status: Never Smoker  . Smokeless tobacco: Never Used  Substance Use Topics  . Alcohol use: No  . Drug use: No     Allergies   Ace inhibitors; Albuterol; Penicillins; and Statins   Review of Systems Review of Systems  Respiratory: Positive for shortness of breath.   All  other systems reviewed and are negative.    Physical Exam Updated Vital Signs BP 120/65 (BP Location: Left Arm)   Pulse 84   Temp 98.5 F (36.9 C) (Oral)   Resp (!) 22   SpO2 98%   Physical Exam  Constitutional:  Chronically ill   HENT:  Head: Normocephalic.  Mouth/Throat: Oropharynx is clear and moist.  Eyes: Pupils are equal, round, and reactive to light. EOM are normal.  Neck: Normal range of motion.  Cardiovascular: Normal rate.  Pulmonary/Chest:  Crackles bilateral bases   Abdominal: Soft. Bowel sounds are normal.  Musculoskeletal:       Right lower leg: Normal. She exhibits edema.       Left lower leg: Normal. She exhibits edema.  2+ edema bilaterally   Neurological: She is alert.  Skin: Skin is warm.  Psychiatric: She has a normal mood and affect.  Nursing note and vitals reviewed.    ED Treatments / Results  Labs (all labs ordered are listed, but only abnormal results are displayed) Labs Reviewed  BASIC METABOLIC PANEL - Abnormal; Notable for the following components:      Result Value   Glucose, Bld 202 (*)    BUN 42 (*)    Creatinine, Ser 4.32 (*)    Calcium 8.8 (*)    GFR calc non Af Amer 10 (*)    GFR calc Af Amer 12 (*)    All other components within normal limits  CBC - Abnormal; Notable for the following components:   WBC 14.0 (*)    RBC 3.58 (*)    Hemoglobin 7.7 (*)    HCT 26.5 (*)    MCV 74.0 (*)    MCH 21.5 (*)    MCHC 29.1 (*)    RDW 15.9 (*)    Platelets 403 (*)    All other components within normal limits  HEPATIC FUNCTION PANEL - Abnormal; Notable for the following components:   Total Protein 6.3 (*)    Albumin 2.0 (*)    Bilirubin, Direct <0.1 (*)    All other components within normal limits  I-STAT CHEM 8, ED - Abnormal; Notable for the following components:   BUN 42 (*)  Creatinine, Ser 4.00 (*)    Glucose, Bld 208 (*)    Hemoglobin 8.5 (*)    HCT 25.0 (*)    All other components within normal limits  BRAIN  NATRIURETIC PEPTIDE  URINALYSIS, ROUTINE W REFLEX MICROSCOPIC  I-STAT TROPONIN, ED    EKG EKG Interpretation  Date/Time:  Tuesday August 31 2017 13:37:05 EDT Ventricular Rate:  83 PR Interval:    QRS Duration: 84 QT Interval:  378 QTC Calculation: 445 R Axis:   29 Text Interpretation:  Sinus rhythm Abnormal T, consider ischemia, lateral leads No significant change since last tracing Confirmed by Wandra Arthurs ([id number]) on 08/31/2017 1:44:39 PM   Radiology Dg Chest 2 View  Result Date: 08/31/2017 CLINICAL DATA:  Recent diagnosis of pneumonia. Bilateral lower extremity swelling and pain. EXAM: CHEST - 2 VIEW COMPARISON:  Chest radiograph Aug 18, 2017 FINDINGS: Cardiac silhouette is mildly enlarged. Mediastinal silhouette is not suspicious. Mildly calcified aortic arch. Pulmonary vascular congestion and mild interstitial prominence. LEFT lung base pleural thickening without pleural effusion or focal consolidation. No pneumothorax. Soft tissue planes and included osseous structures are unchanged. IMPRESSION: Mild cardiomegaly. Mild interstitial prominence, possible pulmonary edema. Aortic Atherosclerosis (ICD10-I70.0). Electronically Signed   By: Elon Alas M.D.   On: 08/31/2017 14:12    Procedures Procedures (including critical care time)  Medications Ordered in ED Medications - No data to display   Initial Impression / Assessment and Plan / ED Course  I have reviewed the triage vital signs and the nursing notes.  Pertinent labs & imaging results that were available during my care of the patient were reviewed by me and considered in my medical decision making (see chart for details).     ZANAYA BAIZE is a 63 y.o. female here with SOB. Worse SOB after being admitted for pneumonia. No infectious symptoms. She is on lasix 120 mg in AM and 80 mg in PM. Will check labs, BNP, CXR.   2:30 pm CXR showed pulmonary edema. Unfortunately, her Cr went up to 4 and she is on large doses of  lasix. I called cardiology to see patient regarding how best to diurese patient. They recommend that patient be admitted to medicine service and they will consult. Will admit to hospitalist service for CHF exacerbation, renal failure.   3:14 PM Dr. Lorin Mercy to admit. Recommend nephrology consult given worsening renal failure. She is not on dialysis and has no access for dialysis. I called Dr. Joelyn Oms to see patient as consult.   Final Clinical Impressions(s) / ED Diagnoses   Final diagnoses:  AKI (acute kidney injury) ([place])  Acute on chronic diastolic congestive heart failure [facility])    ED Discharge Orders    None       Drenda Freeze, MD 08/31/17 1437    Drenda Freeze, MD 08/31/17 [phone]

## 2017-08-31 NOTE — ED Notes (Signed)
Light green, dark green, blue, and lavender tops collected. Dark green top on rocker in [employer]. The rest are in the Pt's room.

## 2017-08-31 NOTE — ED Notes (Signed)
ED TO INPATIENT HANDOFF REPORT  Name/Age/Gender Kathryn Beck 62 y.o. female  Code Status    Code Status Orders  (From admission, onward)        Start     Ordered   08/31/17 1635  Full code  Continuous     08/31/17 1635    Code Status History    Date Active Date Inactive Code Status Order ID Comments User Context   08/18/2017 1933 08/20/2017 2137 Full Code [id number]  Barton Dubois, MD Inpatient   03/24/2017 1622 03/31/2017 1604 Full Code [id number]  Louellen Molder, MD ED   10/10/2015 1623 10/16/2015 2021 Full Code [id number]  Donne Hazel, MD ED      Home/SNF/Other Home  Chief Complaint chf sob   Level of Care/Admitting Diagnosis ED Disposition    ED Disposition Condition Comment   [place]: [facility] [[id number]]  Level of Care: Telemetry [5]  Diagnosis: Acute exacerbation of CHF (congestive heart failure) [facility]) [[id number]]  Admitting Physician: Karmen Bongo [[id number]]  Attending Physician: Karmen Bongo [[id number]]  Estimated length of stay: 3 - 4 days  Certification:: I certify this patient will need inpatient services for at least 2 midnights  PT Class (Do Not Modify): Inpatient [101]  PT Acc Code (Do Not Modify): Private [1]       Medical History Past Medical History:  Diagnosis Date  . Acid reflux   . Anemia   . Arthritis   . Axillary masses    Soft tissue - status post excision  . Back pain   . Chronic kidney disease   . Diastolic heart failure ([place])   . Essential hypertension   . Ischemic heart disease    Abnormal Myoview April 2018 - medical therapy  . Mixed hyperlipidemia   . Obesity   . Pancreatitis   . Sleep apnea    Noncompliant with CPAP  . Type 2 diabetes mellitus, uncontrolled (H[facility])     Allergies Allergies  Allergen Reactions  . Ace Inhibitors Anaphylaxis and Swelling  . Albuterol Swelling  . Penicillins Itching, Swelling and Other (See Comments)    Has patient had a PCN reaction causing immediate rash,  facial/tongue/throat swelling, SOB or lightheadedness with hypotension: Yes Has patient had a PCN reaction causing severe rash involving mucus membranes or skin necrosis: No Has patient had a PCN reaction that required hospitalization No Has patient had a PCN reaction occurring within the last 10 years: No If all of the above answers are "NO", then may proceed with Cephalosporin use.   . Statins Other (See Comments)    elevated LFT's    IV Location/Drains/Wounds Patient Lines/Drains/Airways Status   Active Line/Drains/Airways    Name:   Placement date:   Placement time:   Site:   Days:   Myringotomy Tube   04/28/17    0950    Bilateral Ears   125   Incision 01/09/13 Axilla Right   01/09/13    0844     1695   Incision 01/09/13 Axilla Left   01/09/13    0844     1695   Incision (Closed) 04/28/17 Ear Other (Comment)   04/28/17    0950     125          Labs/Imaging Results for orders placed or performed during the hospital encounter of 08/31/17 (from the past 48 hour(s))  Basic metabolic panel     Status: Abnormal   Collection Time: 08/31/17  1:43 PM  Result Value Ref Range   Sodium 145 135 - 145 mmol/L   Potassium 3.7 3.5 - 5.1 mmol/L   Chloride 107 101 - 111 mmol/L   CO2 26 22 - 32 mmol/L   Glucose, Bld 202 (H) 65 - 99 mg/dL   BUN 42 (H) 6 - 20 mg/dL   Creatinine, Ser 4.32 (H) 0.44 - 1.00 mg/dL   Calcium 8.8 (L) 8.9 - 10.3 mg/dL   GFR calc non Af Amer 10 (L) >60 mL/min   GFR calc Af Amer 12 (L) >60 mL/min    Comment: (NOTE) The eGFR has been calculated using the CKD EPI equation. This calculation has not been validated in all clinical situations. eGFR's persistently <60 mL/min signify possible Chronic Kidney Disease.    Anion gap 12 5 - 15    Comment: Performed at [facility] [address], [place], [place] [address]  CBC     Status: Abnormal   Collection Time: 08/31/17  1:43 PM  Result Value Ref Range   WBC 14.0 (H) 4.0 - 10.5 K/uL   RBC 3.58 (L) 3.87 - 5.11  MIL/uL   Hemoglobin 7.7 (L) 12.0 - 15.0 g/dL   HCT 26.5 (L) 36.0 - 46.0 %   MCV 74.0 (L) 78.0 - 100.0 fL   MCH 21.5 (L) 26.0 - 34.0 pg   MCHC 29.1 (L) 30.0 - 36.0 g/dL   RDW 15.9 (H) 11.5 - 15.5 %   Platelets 403 (H) 150 - 400 K/uL    Comment: Performed at [facility], [place] [address] [place], [place] [address]  Brain natriuretic peptide     Status: None   Collection Time: 08/31/17  1:43 PM  Result Value Ref Range   B Natriuretic Peptide 84.5 0.0 - 100.0 pg/mL    Comment: Performed at [facility] [address] [place], [place] [address]  Hepatic function panel     Status: Abnormal   Collection Time: 08/31/17  1:43 PM  Result Value Ref Range   Total Protein 6.3 (L) 6.5 - 8.1 g/dL   Albumin 2.0 (L) 3.5 - 5.0 g/dL   AST 16 15 - 41 U/L   ALT 18 14 - 54 U/L   Alkaline Phosphatase 94 38 - 126 U/L   Total Bilirubin 0.5 0.3 - 1.2 mg/dL   Bilirubin, Direct <0.1 (L) 0.1 - 0.5 mg/dL   Indirect Bilirubin NOT CALCULATED 0.3 - 0.9 mg/dL    Comment: Performed at [facility] [address], [place],  [address]  I-stat troponin, ED     Status: None   Collection Time: 08/31/17  1:50 PM  Result Value Ref Range   Troponin i, poc 0.04 0.00 - 0.08 ng/mL   Comment 3            Comment: Due to the release kinetics of cTnI, a negative result within the first hours of the onset of symptoms does not rule out myocardial infarction with certainty. If myocardial infarction is still suspected, repeat the test at appropriate intervals.   I-stat chem 8, ed     Status: Abnormal   Collection Time: 08/31/17  1:52 PM  Result Value Ref Range   Sodium 144 135 - 145 mmol/L   Potassium 3.7 3.5 - 5.1 mmol/L   Chloride 106 101 - 111 mmol/L   BUN 42 (H) 6 - 20 mg/dL   Creatinine, Ser 4.00 (H) 0.44 - 1.00 mg/dL   Glucose, Bld 208 (H)  65 - 99 mg/dL   Calcium, Ion 1.15 1.15 - 1.40 mmol/L   TCO2 25 22 - 32 mmol/L   Hemoglobin 8.5 (L) 12.0 - 15.0 g/dL   HCT 25.0 (L) 36.0 - 46.0 %   Urinalysis, Routine w reflex microscopic     Status: Abnormal   Collection Time: 08/31/17  1:56 PM  Result Value Ref Range   Color, Urine YELLOW YELLOW   APPearance HAZY (A) CLEAR   Specific Gravity, Urine 1.016 1.005 - 1.030   pH 5.0 5.0 - 8.0   Glucose, UA >=500 (A) NEGATIVE mg/dL   Hgb urine dipstick SMALL (A) NEGATIVE   Bilirubin Urine NEGATIVE NEGATIVE   Ketones, ur NEGATIVE NEGATIVE mg/dL   Protein, ur >=300 (A) NEGATIVE mg/dL   Nitrite NEGATIVE NEGATIVE   Leukocytes, UA NEGATIVE NEGATIVE   RBC / HPF 0-5 0 - 5 RBC/hpf   WBC, UA >50 (H) 0 - 5 WBC/hpf   Bacteria, UA FEW (A) NONE SEEN   Squamous Epithelial / LPF 11-20 0 - 5   Mucus PRESENT     Comment: Performed at [facility], [place] [address] [place], [place] [address]  CBG monitoring, ED     Status: None   Collection Time: 08/31/17  5:46 PM  Result Value Ref Range   Glucose-Capillary 85 65 - 99 mg/dL   Dg Chest 2 View  Result Date: 08/31/2017 CLINICAL DATA:  Recent diagnosis of pneumonia. Bilateral lower extremity swelling and pain. EXAM: CHEST - 2 VIEW COMPARISON:  Chest radiograph Aug 18, 2017 FINDINGS: Cardiac silhouette is mildly enlarged. Mediastinal silhouette is not suspicious. Mildly calcified aortic arch. Pulmonary vascular congestion and mild interstitial prominence. LEFT lung base pleural thickening without pleural effusion or focal consolidation. No pneumothorax. Soft tissue planes and included osseous structures are unchanged. IMPRESSION: Mild cardiomegaly. Mild interstitial prominence, possible pulmonary edema. Aortic Atherosclerosis (ICD10-I70.0). Electronically Signed   By: Elon Alas M.D.   On: 08/31/2017 14:12    Pending Labs Unresulted Labs (From admission, onward)   Start     Ordered   09/01/17 0500  Parathyroid hormone, intact (no Ca)  Tomorrow morning,   R     08/31/17 1617   09/01/17 0500  Iron and TIBC  Tomorrow morning,   R     08/31/17 1630   09/01/17 0500  Ferritin  Tomorrow  morning,   R     08/31/17 1630   09/01/17 8527  Basic metabolic panel  Daily,   R     08/31/17 1635   09/01/17 0000  CBC WITH DIFFERENTIAL  Tomorrow morning,   R     08/31/17 1635   08/31/17 1618  Phosphorus  Add-on,   R     08/31/17 1617   08/31/17 1617  Protein / creatinine ratio, urine  Once,   R     08/31/17 1617      Vitals/Pain Today's Vitals   08/31/17 1639 08/31/17 1645 08/31/17 1745 08/31/17 1755  BP:      Pulse:  95 100   Resp:  18 (!) 23   Temp:      TempSrc:      SpO2:  96% 95%   PainSc: 0-No pain   0-No pain    Isolation Precautions No active isolations  Medications Medications  furosemide (LASIX) 120 mg in dextrose 5 % 50 mL IVPB (0 mg Intravenous Stopped 08/31/17 1843)  albuterol (PROVENTIL) (2.5 MG/3ML) 0.083% nebulizer solution 3 mL (has no administration in time  range)  atorvastatin (LIPITOR) tablet 20 mg (has no administration in time range)  calcitRIOL (ROCALTROL) capsule 0.25 mcg (has no administration in time range)  cloNIDine (CATAPRES) tablet 0.1 mg (has no administration in time range)  pantoprazole (PROTONIX) EC tablet 40 mg (has no administration in time range)  ezetimibe (ZETIA) tablet 10 mg (has no administration in time range)  gabapentin (NEURONTIN) capsule 100 mg (100 mg Oral Given 08/31/17 1743)  insulin detemir (LEVEMIR) injection 100 Units (has no administration in time range)  isosorbide mononitrate (IMDUR) 24 hr tablet 30 mg (has no administration in time range)  labetalol (NORMODYNE) tablet 400 mg (has no administration in time range)  methocarbamol (ROBAXIN) tablet 500 mg (has no administration in time range)  sodium chloride flush (NS) 0.9 % injection 3 mL (has no administration in time range)  sodium chloride flush (NS) 0.9 % injection 3 mL (has no administration in time range)  0.9 %  sodium chloride infusion (has no administration in time range)  aspirin EC tablet 81 mg (has no administration in time range)  acetaminophen  (TYLENOL) tablet 650 mg (has no administration in time range)  ondansetron (ZOFRAN) injection 4 mg (has no administration in time range)  insulin aspart (novoLOG) injection 0-15 Units (0 Units Subcutaneous Not Given 08/31/17 1752)  zolpidem (AMBIEN) tablet 5 mg (has no administration in time range)  sorbitol 70 % solution 30 mL (has no administration in time range)  docusate sodium (ENEMEEZ) enema 283 mg (has no administration in time range)  camphor-menthol (SARNA) lotion 1 application (has no administration in time range)    And  hydrOXYzine (ATARAX/VISTARIL) tablet 25 mg (has no administration in time range)  calcium carbonate (dosed in mg elemental calcium) suspension 500 mg of elemental calcium (has no administration in time range)  feeding supplement (NEPRO CARB STEADY) liquid 237 mL (has no administration in time range)  enoxaparin (LOVENOX) injection 30 mg (has no administration in time range)  insulin aspart (novoLOG) injection 0-5 Units (has no administration in time range)  0.9 %  sodium chloride infusion ( Intravenous Stopped 08/31/17 1908)  ferumoxytol (FERAHEME) 510 mg in sodium chloride 0.9 % 100 mL IVPB (has no administration in time range)    Mobility walks

## 2017-08-31 NOTE — Consult Note (Signed)
Kathryn Beck Admit Date: 08/31/2017 08/31/2017 Rexene Agent Requesting Physician:  Darl Householder MD  Reason for Consult:  Progressive CKD, Edema, Dyspnea HPI:  62 year old female presented to the emergency room today with progressive dyspnea and lower extremity edema.  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  On calcitriol for secondary hyperparathyroidism  Patient was seen in the emergency room with husband and daughter.  She recently had labs with primary care with a creatinine around 4 and was referred to see nephrology, Dr. Lowanda Foster.  That visit was last week and furosemide was increased to 120 mg in the morning and 80 mg in the evening.  Despite increased diuretics her edema worsened.  Of note she was also scheduled for an infusion of iron for her anemia.  Patient denies any nausea, vomiting.  She has some anorexia.  She does not follow a low-salt diet.  Denies angina.  No nonsteroidal use.  No recent contrast exposures.  2V CXR with possible edema.     Creat (mg/dL)  Date Value  08/23/2017 3.74 (H)  11/25/2016 2.63 (H)  11/16/2016 2.39 (H)  06/09/2016 2.43 (H)  02/28/2016 1.91 (H)  01/23/2016 2.51 (H)  01/14/2016 1.94 (H)  10/07/2015 2.14 (H)  07/23/2015 1.37 (H)  04/25/2015 1.97 (H)   Creatinine, Ser (mg/dL)  Date Value  08/31/2017 4.00 (H)  08/31/2017 4.32 (H)  08/20/2017 3.28 (H)  08/19/2017 3.49 (H)  08/18/2017 3.73 (H)  08/15/2017 2.80 (H)  04/28/2017 2.91 (H)  03/31/2017 2.22 (H)  03/28/2017 2.47 (H)   03/26/2017 2.57 (H)  ] ROS NSAIDS: denies IV Contrast no exposure TMP/SMX no exposure Hypotension not present Balance of 12 systems is negative w/ exceptions as above  PMH  Past Medical History:  Diagnosis Date  . Acid reflux   . Anemia   . Arthritis   . Axillary masses    Soft tissue - status post excision  . Back pain   . Chronic kidney disease   . Diastolic heart failure ([place])   . Essential hypertension   . Ischemic heart disease    Abnormal Myoview April 2018 - medical therapy  . Mixed hyperlipidemia   . Obesity   . Pancreatitis   . Sleep apnea    Noncompliant with CPAP  . Type 2 diabetes mellitus, uncontrolled ([place])    PSH  Past Surgical History:  Procedure Laterality Date  . ABDOMINAL HYSTERECTOMY    . COLONOSCOPY  2008   Dr. Oneida Alar: normal   . COLONOSCOPY N/A 12/18/2016   Dr. Oneida Alar: multiple tubular adenomas, internal hemorrhoids. Surveillance in 3 years   . ESOPHAGEAL DILATION N/A 10/13/2015   Procedure: ESOPHAGEAL DILATION;  Surgeon: Rogene Houston, MD;  Location: [facility];  Service: Endoscopy;  Laterality: N/A;  . ESOPHAGOGASTRODUODENOSCOPY N/A 10/13/2015   Dr. Laural Golden: chronic gastritis on path, no H.pylori. Empiric dilation   . ESOPHAGOGASTRODUODENOSCOPY N/A 12/18/2016   Dr. Oneida Alar: mild gastritis. BRAVO study revealed uncontrolled GERD. Dysphagia secondary to uncontrolled reflux  . FOOT SURGERY Bilateral   . LUNG BIOPSY    . MASS EXCISION Right 01/09/2013   Procedure: EXCISION OF  NEOPLASM OF RIGHT  AXILLA  AND EXCISION OF NEOPLASM OF LEFT AXILLA;  Surgeon: Jamesetta So, MD;  Location: [facility];  Service: General;  Laterality: Right;  procedure end @ 08:23  . MYRINGOTOMY WITH TUBE PLACEMENT Bilateral 04/28/2017   Procedure: BILATERAL MYRINGOTOMY WITH TUBE PLACEMENT;  Surgeon: Leta Baptist, MD;  Location: [facility];  Service: ENT;  Laterality: Bilateral;  . SAVORY DILATION N/A 12/18/2016   Procedure: SAVORY DILATION;  Surgeon: Danie Binder, MD;  Location: [facility];  Service: Endoscopy;  Laterality: N/A;   FH  Family History  Problem Relation Age of Onset  . Hypertension Father   . Hypercholesterolemia Father   . Arthritis Father   . Hypertension Sister   . Hypercholesterolemia Sister   . Breast cancer Sister   . Hypertension Sister   . Colon cancer Neg Hx   . Colon polyps Neg Hx    SH  reports that she has never smoked. She has never used smokeless tobacco. She reports that she does not drink alcohol or use drugs. Allergies  Allergies  Allergen Reactions  . Ace Inhibitors Anaphylaxis and Swelling  . Albuterol Swelling  . Penicillins Itching, Swelling and Other (See Comments)    Has patient had a PCN reaction causing immediate rash, facial/tongue/throat swelling, SOB or lightheadedness with hypotension: Yes Has patient had a PCN reaction causing severe rash involving mucus membranes or skin necrosis: No Has patient had a PCN reaction that required hospitalization No Has patient had a PCN reaction occurring within the last 10 years: No If all of the above answers are "NO", then may proceed with Cephalosporin use.   . Statins Other (See Comments)    elevated LFT's   Home medications Prior to Admission medications   Medication Sig Start Date End Date Taking? Authorizing Provider  acetaminophen (TYLENOL) 500 MG tablet Take 1,000 mg by mouth every 6 (six) hours as needed for moderate pain or headache.   Yes [provider]  albuterol (PROVENTIL HFA;VENTOLIN HFA) 108 (90 Base) MCG/ACT inhaler Inhale 2 puffs into the lungs every 4 (four) hours as needed for wheezing or shortness of breath. 08/15/17  Yes Noemi Chapel, MD  amLODipine (NORVASC) 10 MG tablet Take 1 tablet (10 mg total) by mouth daily. 05/31/17  Yes Fayrene Helper, MD  atorvastatin (LIPITOR) 20 MG tablet Take 1 tablet (20 mg total) by mouth daily. 05/31/17  Yes Fayrene Helper, MD  azelastine (ASTELIN) 0.1 % nasal spray Place 2 sprays into both nostrils 2  (two) times daily. Use in each nostril as directed Patient taking differently: Place 2 sprays into both nostrils 2 (two) times daily as needed for rhinitis or allergies. Use in each nostril as directed 05/31/17  Yes Fayrene Helper, MD  calcitRIOL (ROCALTROL) 0.25 MCG capsule Take 0.25 mcg by mouth every Monday, Wednesday, and Friday.  09/15/16  Yes Fayrene Helper, MD  cloNIDine (CATAPRES) 0.1 MG tablet TAKE 1 TABLET BY MOUTH  EVERY EVENING AT 9PM FOR  BLOOD PRESSURE Patient taking differently: Take 0.1 mg by mouth every evening.  05/31/17  Yes Fayrene Helper, MD  dexlansoprazole (DEXILANT) 60 MG capsule 1 PO EVERY MORNING WITH BREAKFAST. Patient taking differently: Take 60 mg by mouth daily. . 04/15/17  Yes Fields, Sandi L, MD  dextromethorphan-guaiFENesin (MUCINEX DM) 30-600 MG 12hr tablet Take 1 tablet by mouth 2 (two) times daily. Patient taking differently: Take 1 tablet by mouth daily.  08/20/17  Yes Barton Dubois, MD  diclofenac (VOLTAREN) 75 MG EC tablet Take 75 mg by mouth 2 (two) times daily as needed for mild pain or moderate pain.  08/21/17  Yes [provider]  dicyclomine (BENTYL) 10 MG capsule 1 po qachs if needed to prevent diarrhea and abdominal cramps Patient taking differently: Take 10 mg by mouth at bedtime as needed. as needed to prevent diarrhea and abdominal cramps. 12/18/16  Yes Fields, Sandi L, MD  ezetimibe (ZETIA) 10 MG tablet Take 1 tablet (10 mg total) by mouth daily. 05/31/17  Yes Fayrene Helper, MD  furosemide (LASIX) 40 MG tablet One tablet two times daily, as needed, for leg swelling Patient taking differently: Take 40 mg by mouth 2 (two) times daily.  07/11/17  Yes Fayrene Helper, MD  gabapentin (NEURONTIN) 100 MG capsule Take 1 capsule (100 mg total) by mouth 3 (three) times daily. 07/05/17  Yes Edrick Kins, DPM  Insulin Detemir (LEVEMIR) 100 UNIT/ML Pen Inject 100 Units into the skin daily at 10 pm. Patient taking differently: Inject 100  Units into the skin daily.  03/19/17  Yes [name], MD  Insulin Lispro (HUMALOG KWIKPEN) 200 UNIT/ML SOPN Inject 32-36 Units into the skin 3 (three) times daily before meals. Patient taking differently: Inject 32-36 Units into the skin 3 (three) times daily before meals. Per sliding scale 06/18/17  Yes [name], MD  isosorbide mononitrate (IMDUR) 30 MG 24 hr tablet Take 1 tablet (30 mg total) by mouth daily. 05/31/17  Yes Fayrene Helper, MD  labetalol (NORMODYNE) 200 MG tablet Take 2 tablets (400 mg total) by mouth 2 (two) times daily. 05/31/17  Yes Fayrene Helper, MD  methocarbamol (ROBAXIN) 500 MG tablet Take 1 tablet (500 mg total) by mouth every 8 (eight) hours as needed for muscle spasms. 08/20/17  Yes Barton Dubois, MD  olmesartan (BENICAR) 40 MG tablet Take 1 tablet (40 mg total) by mouth daily. Hold until follow up with PCP Patient taking differently: Take 40 mg by mouth every evening.  08/20/17  Yes Barton Dubois, MD  Vitamin D, Ergocalciferol, (DRISDOL) 50000 units CAPS capsule Take 1 capsule (50,000 Units total) by mouth every 7 (seven) days. Patient taking differently: Take 50,000 Units by mouth every 7 (seven) days. Take every Tuesday 07/08/17  Yes Fayrene Helper, MD  FLUoxetine (PROZAC) 10 MG capsule Take 10 mg by mouth daily.    05/28/11  [provider]  glipiZIDE (GLUCOTROL) 10 MG tablet Take 10 mg by mouth 2 (two) times daily before a meal.    05/28/11  [provider]    Current Medications Scheduled Meds: Continuous Infusions: . furosemide     PRN Meds:.  CBC Recent Labs  Lab 08/31/17 1343 08/31/17 1352  WBC 14.0*  --   HGB 7.7* 8.5*  HCT 26.5* 25.0*  MCV 74.0*  --   PLT 403*  --    Basic Metabolic Panel Recent Labs  Lab 08/31/17 1343 08/31/17 1352  NA 145 144  K 3.7 3.7  CL 107 106  CO2 26  --   GLUCOSE 202* 208*  BUN 42* 42*  CREATININE 4.32* 4.00*  CALCIUM 8.8*  --     Physical Exam  Blood pressure  120/65, pulse 84, temperature 98.5 F (36.9 C), temperature source Oral, resp. rate (!) 22, SpO2 98 %. 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  1. 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 Hypervoelmia / LEE / SOB / Pulm Edema likely primarily from #1 3. Anemia, microcytic, was told had IDA 4. HTN on CCB and clonidine daily 5. Recent CAP of LLL s/p levofloxacin 6. ? 2HPTH, on C3 7. GERD  Plan 1. Lasix 120mg  IV TID 2. Dietician consult for dietary Na education 3. Check PTH, PHos, TSAT, Ferritin 4. Broached why might need to discuss dialysis, how proactive approach benefits her; might need HD prior to DC if diuretics fail; might need AVF prior to discharge as well 5. ESA and Fe based on anemia w/u   Pearson Grippe MD [phone] pgr 08/31/2017, 4:18 PM

## 2017-08-31 NOTE — ED Notes (Signed)
Admitting MD at bedside.

## 2017-08-31 NOTE — ED Notes (Signed)
Cardiology at bedside.

## 2017-08-31 NOTE — Consult Note (Addendum)
Cardiology Consultation:   Patient ID: SAMHITA KRETSCH; [id number]; 1956/02/05   Admit date: 08/31/2017 Date of Consult: 08/31/2017  Primary Care Provider: Fayrene Helper, MD Primary Cardiologist: Rozann Lesches, MD Primary Electrophysiologist:  None   Patient Profile:   JANIYLAH HANNIS is a 62 y.o. female with a PMH of chest pain,  chronic diastolic CHF, HTN, HLD, DM type 2, CKD stage III-IV, OSA not compliant with CPAP, obesity, and recent admission for PNA (discharged 08/20/17), who is being seen today for the evaluation of LE edema and SOB at the request of Dr. Darl Householder.  History of Present Illness:   Ms. Critz has been feeling poorly for 2 weeks now. She initially presented to [facility] on 08/18/17 with complaints of SOB and a productive cough, found to have PNA on CT chest and was admitted to the hospital for IVF, antibiotics, and nebulizer treatments. She was discharged home 08/20/17, however continued to feel poorly so she presented to [facility] where she was discharged from the ED with the diagnosis of pleurisy. Since that time, she states her SOB has improved, however she has had progressively worsening LE edema. She was evaluated by her Nephrologist (Dr. Lowanda Foster) on 08/25/17 and her po lasix was increased to 120mg  qAM and 80mg  qPM. She states her weight has remained stable if not slightly decreased since that visit (218lbs this AM), however her LE edema has progressed, making it difficult to ambulate.   Her last echocardiogram 02/2016 revealed EF 65-70% and G1DD. She was last evaluated outpatient by Dr. Domenic Polite 07/2016 at which time she had mild LE edema. Given her CKD, he was reluctant to push her diuretics higher and no medication changes occurred at that visit   In  06/2016  She had lexiscan myovue   This was read as anterior scar with periinfarct ischemia  With periinfarct ischemia  LVEF normal with normal wall motion   Plan  for medical management.    At the time of  this evaluation, she states her breathing is stable and has been improving over the past week, however her LE edema is her primary complaint. She has chronic orthopnea and PND and is generally non-compliant with her CPAP. She has occasional DOE which she states is unchanged. She denies chest pain, dizziness, lightheadedness, syncope, or abdominal pain.  ED course: VSS. Labs notable for electrolytes wnl, Cr 4.0 (baseline has been steadily increasing this year - 2.82.4 03/2017 to 3.5 07/2017), Hgb 8.5 (at baseline), PLT 403, trop 0.04. CXR with possible pulmonary edema. EKG with sinus rhythm with TWI in lateral leads (new from previous), no STE/D. Cardiology asked to evaluate for acute on chronic diastolic CHF.  Past Medical History:  Diagnosis Date  . Acid reflux   . Anemia   . Arthritis   . Axillary masses    Soft tissue - status post excision  . Back pain   . Chronic kidney disease   . Essential hypertension   . Ischemic heart disease    Abnormal Myoview April 2018 - medical therapy  . Mixed hyperlipidemia   . Obesity   . Pancreatitis   . Sleep apnea    Noncompliant with CPAP  . Type 2 diabetes mellitus, uncontrolled ([place])     Past Surgical History:  Procedure Laterality Date  . ABDOMINAL HYSTERECTOMY    . COLONOSCOPY  2008   Dr. Oneida Alar: normal   . COLONOSCOPY N/A 12/18/2016   Dr. Oneida Alar: multiple tubular adenomas, internal hemorrhoids. Surveillance in  3 years   . ESOPHAGEAL DILATION N/A 10/13/2015   Procedure: ESOPHAGEAL DILATION;  Surgeon: Rogene Houston, MD;  Location: [facility];  Service: Endoscopy;  Laterality: N/A;  . ESOPHAGOGASTRODUODENOSCOPY N/A 10/13/2015   Dr. Laural Golden: chronic gastritis on path, no H.pylori. Empiric dilation   . ESOPHAGOGASTRODUODENOSCOPY N/A 12/18/2016   Dr. Oneida Alar: mild gastritis. BRAVO study revealed uncontrolled GERD. Dysphagia secondary to uncontrolled reflux  . FOOT SURGERY Bilateral   . LUNG BIOPSY    . MASS EXCISION Right 01/09/2013    Procedure: EXCISION OF NEOPLASM OF RIGHT  AXILLA  AND EXCISION OF NEOPLASM OF LEFT AXILLA;  Surgeon: Jamesetta So, MD;  Location: [facility];  Service: General;  Laterality: Right;  procedure end @ 08:23  . MYRINGOTOMY WITH TUBE PLACEMENT Bilateral 04/28/2017   Procedure: BILATERAL MYRINGOTOMY WITH TUBE PLACEMENT;  Surgeon: Leta Baptist, MD;  Location: [facility];  Service: ENT;  Laterality: Bilateral;  . SAVORY DILATION N/A 12/18/2016   Procedure: SAVORY DILATION;  Surgeon: Danie Binder, MD;  Location: [facility];  Service: Endoscopy;  Laterality: N/A;     Home Medications:  Prior to Admission medications   Medication Sig Start Date End Date Taking? Authorizing Provider  acetaminophen (TYLENOL) 500 MG tablet Take 1,000 mg by mouth every 6 (six) hours as needed for moderate pain or headache.   Yes [provider]  albuterol (PROVENTIL HFA;VENTOLIN HFA) 108 (90 Base) MCG/ACT inhaler Inhale 2 puffs into the lungs every 4 (four) hours as needed for wheezing or shortness of breath. 08/15/17  Yes Noemi Chapel, MD  amLODipine (NORVASC) 10 MG tablet Take 1 tablet (10 mg total) by mouth daily. 05/31/17  Yes Fayrene Helper, MD  atorvastatin (LIPITOR) 20 MG tablet Take 1 tablet (20 mg total) by mouth daily. 05/31/17  Yes Fayrene Helper, MD  azelastine (ASTELIN) 0.1 % nasal spray Place 2 sprays into both nostrils 2 (two) times daily. Use in each nostril as directed Patient taking differently: Place 2 sprays into both nostrils 2 (two) times daily as needed for rhinitis or allergies. Use in each nostril as directed 05/31/17  Yes Fayrene Helper, MD  calcitRIOL (ROCALTROL) 0.25 MCG capsule Take 0.25 mcg by mouth every Monday, Wednesday, and Friday.  09/15/16  Yes Fayrene Helper, MD  cloNIDine (CATAPRES) 0.1 MG tablet TAKE 1 TABLET BY MOUTH  EVERY EVENING AT 9PM FOR  BLOOD PRESSURE Patient taking differently: Take 0.1 mg by mouth every evening.  05/31/17  Yes Fayrene Helper, MD    dexlansoprazole (DEXILANT) 60 MG capsule 1 PO EVERY MORNING WITH BREAKFAST. Patient taking differently: Take 60 mg by mouth daily. . 04/15/17  Yes Fields, Sandi L, MD  dextromethorphan-guaiFENesin (MUCINEX DM) 30-600 MG 12hr tablet Take 1 tablet by mouth 2 (two) times daily. Patient taking differently: Take 1 tablet by mouth daily.  08/20/17  Yes Barton Dubois, MD  diclofenac (VOLTAREN) 75 MG EC tablet Take 75 mg by mouth 2 (two) times daily as needed for mild pain or moderate pain.  08/21/17  Yes [provider]  dicyclomine (BENTYL) 10 MG capsule 1 po qachs if needed to prevent diarrhea and abdominal cramps Patient taking differently: Take 10 mg by mouth at bedtime as needed. as needed to prevent diarrhea and abdominal cramps. 12/18/16  Yes Fields, Sandi L, MD  ezetimibe (ZETIA) 10 MG tablet Take 1 tablet (10 mg total) by mouth daily. 05/31/17  Yes Fayrene Helper, MD  furosemide (LASIX) 40 MG tablet One  tablet two times daily, as needed, for leg swelling Patient taking differently: Take 40 mg by mouth 2 (two) times daily.  07/11/17  Yes Fayrene Helper, MD  gabapentin (NEURONTIN) 100 MG capsule Take 1 capsule (100 mg total) by mouth 3 (three) times daily. 07/05/17  Yes Edrick Kins, DPM  Insulin Detemir (LEVEMIR) 100 UNIT/ML Pen Inject 100 Units into the skin daily at 10 pm. Patient taking differently: Inject 100 Units into the skin daily.  03/19/17  Yes [name], MD  Insulin Lispro (HUMALOG KWIKPEN) 200 UNIT/ML SOPN Inject 32-36 Units into the skin 3 (three) times daily before meals. Patient taking differently: Inject 32-36 Units into the skin 3 (three) times daily before meals. Per sliding scale 06/18/17  Yes [name], MD  isosorbide mononitrate (IMDUR) 30 MG 24 hr tablet Take 1 tablet (30 mg total) by mouth daily. 05/31/17  Yes Fayrene Helper, MD  labetalol (NORMODYNE) 200 MG tablet Take 2 tablets (400 mg total) by mouth 2 (two) times daily. 05/31/17  Yes  Fayrene Helper, MD  methocarbamol (ROBAXIN) 500 MG tablet Take 1 tablet (500 mg total) by mouth every 8 (eight) hours as needed for muscle spasms. 08/20/17  Yes Barton Dubois, MD  olmesartan (BENICAR) 40 MG tablet Take 1 tablet (40 mg total) by mouth daily. Hold until follow up with PCP Patient taking differently: Take 40 mg by mouth every evening.  08/20/17  Yes Barton Dubois, MD  Vitamin D, Ergocalciferol, (DRISDOL) 50000 units CAPS capsule Take 1 capsule (50,000 Units total) by mouth every 7 (seven) days. Patient taking differently: Take 50,000 Units by mouth every 7 (seven) days. Take every Tuesday 07/08/17  Yes Fayrene Helper, MD  FLUoxetine (PROZAC) 10 MG capsule Take 10 mg by mouth daily.    05/28/11  [provider]  glipiZIDE (GLUCOTROL) 10 MG tablet Take 10 mg by mouth 2 (two) times daily before a meal.    05/28/11  [provider]    Inpatient Medications: Scheduled Meds:  Continuous Infusions:  PRN Meds:   Allergies:    Allergies  Allergen Reactions  . Ace Inhibitors Anaphylaxis and Swelling  . Albuterol Swelling  . Penicillins Itching, Swelling and Other (See Comments)    Has patient had a PCN reaction causing immediate rash, facial/tongue/throat swelling, SOB or lightheadedness with hypotension: Yes Has patient had a PCN reaction causing severe rash involving mucus membranes or skin necrosis: No Has patient had a PCN reaction that required hospitalization No Has patient had a PCN reaction occurring within the last 10 years: No If all of the above answers are "NO", then may proceed with Cephalosporin use.   . Statins Other (See Comments)    elevated LFT's    Social History:   Social History   Socioeconomic History  . Marital status: Married    Spouse name: Not on file  . Number of children: Not on file  . Years of education: Not on file  . Highest education level: Not on file  Occupational History  . Not on file  Social Needs  .  Financial resource strain: Not on file  . Food insecurity:    Worry: Not on file    Inability: Not on file  . Transportation needs:    Medical: Not on file    Non-medical: Not on file  Tobacco Use  . Smoking status: Never Smoker  . Smokeless tobacco: Never Used  Substance and Sexual Activity  . Alcohol use: No  .  Drug use: No  . Sexual activity: Not Currently  Lifestyle  . Physical activity:    Days per week: Not on file    Minutes per session: Not on file  . Stress: Not on file  Relationships  . Social connections:    Talks on phone: Not on file    Gets together: Not on file    Attends religious service: Not on file    Active member of club or organization: Not on file    Attends meetings of clubs or organizations: Not on file    Relationship status: Not on file  . Intimate partner violence:    Fear of current or ex partner: Not on file    Emotionally abused: Not on file    Physically abused: Not on file    Forced sexual activity: Not on file  Other Topics Concern  . Not on file  Social History Narrative  . Not on file    Family History:    Family History  Problem Relation Age of Onset  . Hypertension Father   . Hypercholesterolemia Father   . Arthritis Father   . Hypertension Sister   . Hypercholesterolemia Sister   . Breast cancer Sister   . Hypertension Sister   . Colon cancer Neg Hx   . Colon polyps Neg Hx      ROS:  Please see the history of present illness.   All other ROS reviewed and negative.     Physical Exam/Data:   Vitals:   08/31/17 1330 08/31/17 1338  BP:  120/65  Pulse:  84  Resp:  (!) 22  Temp:  98.5 F (36.9 C)  TempSrc:  Oral  SpO2: 97% 98%   No intake or output data in the 24 hours ending 08/31/17 1510 There were no vitals filed for this visit. There is no height or weight on file to calculate BMI.  General:  Obese AAF laying in bed in no acute distress HEENT: sclera anicteric  Neck: unable to assess JVD given body  habitus Endocrine:  No thryomegaly Vascular: No carotid bruits; distal pulses 2+ bilaterally Cardiac:  normal S1, S2; RRR; no murmurs, gallops, or rubs Lungs:  clear to auscultation bilaterally, no wheezing, rhonchi or rales  Abd: NABS, obese, soft, nontender, no hepatomegaly Ext: 2+ b/l LE edema Musculoskeletal:  No deformities, BUE and BLE strength normal and equal Skin: warm and dry  Neuro:  CNs 2-12 intact, no focal abnormalities noted Psych:  Normal affect   EKG:  The EKG was personally reviewed and demonstrates:  sinus rhythm with TWI in lateral leads (new from previous), no STE/D. Telemetry:  Telemetry was personally reviewed and demonstrates:  Sinus rhythm  Relevant CV Studies: Echocardiogram 02/2016: Study Conclusions  - Left ventricle: The cavity size was normal. Wall thickness was   increased in a pattern of moderate LVH. Systolic function was   vigorous. The estimated ejection fraction was in the range of 65%   to 70%. Wall motion was normal; there were no regional wall   motion abnormalities. Doppler parameters are consistent with   abnormal left ventricular relaxation (grade 1 diastolic   dysfunction). LA pressure is indeterminant. - Aortic valve: Valve area (VTI): 2.39 cm^2. Valve area (Vmax):   2.83 cm^2. - Technically adequate study.  Nuclear Stress Test 06/2017: Study Result    There was no ST segment deviation noted during stress.  Findings consistent with prior moderate anterior myocardial infarction with moderate peri-infarct ischemia. SSS 10 SRS 4  SDS 6  The left ventricular ejection fraction is normal (55-65%).  This is an intermediate risk study.      Laboratory Data:  Chemistry Recent Labs  Lab 08/31/17 1343 08/31/17 1352  NA 145 144  K 3.7 3.7  CL 107 106  CO2 26  --   GLUCOSE 202* 208*  BUN 42* 42*  CREATININE 4.32* 4.00*  CALCIUM 8.8*  --   GFRNONAA 10*  --   GFRAA 12*  --   ANIONGAP 12  --     Recent Labs  Lab  08/31/17 1343  PROT 6.3*  ALBUMIN 2.0*  AST 16  ALT 18  ALKPHOS 94  BILITOT 0.5   Hematology Recent Labs  Lab 08/31/17 1343 08/31/17 1352  WBC 14.0*  --   RBC 3.58*  --   HGB 7.7* 8.5*  HCT 26.5* 25.0*  MCV 74.0*  --   MCH 21.5*  --   MCHC 29.1*  --   RDW 15.9*  --   PLT 403*  --    Cardiac EnzymesNo results for input(s): TROPONINI in the last 168 hours.  Recent Labs  Lab 08/31/17 1350  TROPIPOC 0.04    BNP Recent Labs  Lab 08/31/17 1343  BNP 84.5    DDimer No results for input(s): DDIMER in the last 168 hours.  Radiology/Studies:  Dg Chest 2 View  Result Date: 08/31/2017 CLINICAL DATA:  Recent diagnosis of pneumonia. Bilateral lower extremity swelling and pain. EXAM: CHEST - 2 VIEW COMPARISON:  Chest radiograph Aug 18, 2017 FINDINGS: Cardiac silhouette is mildly enlarged. Mediastinal silhouette is not suspicious. Mildly calcified aortic arch. Pulmonary vascular congestion and mild interstitial prominence. LEFT lung base pleural thickening without pleural effusion or focal consolidation. No pneumothorax. Soft tissue planes and included osseous structures are unchanged. IMPRESSION: Mild cardiomegaly. Mild interstitial prominence, possible pulmonary edema. Aortic Atherosclerosis (ICD10-I70.0). Electronically Signed   By: Elon Alas M.D.   On: 08/31/2017 14:12    Assessment and Plan:   1. Acute on chronic diastolic CHF: patient presents with worsening LE edema, not responsive to higher po lasix dosing (120mg  qAM and 80mg  qPM). CXR this admission with possible pulmonary edema. Her lungs are clear on exam but she has 1-2+ LE edema. Last echo 02/2016 with EF 65-70% with G1DD. Acute exacerbation could be 2/2 recent admission and IVF administration. Unfortunately her Cr is up to 4.0 today.   - Will repeat echocardiogram to reconfirm LVEF with exacerbation of edema - Recommend starting IV lasix 120mg  now and monitor for response  - Recommend nephrology consult for  assistance with diuresis going forward given progression of kidney disease.  - Continue labetalol - Would continue to hold home olmesartan (likely this has not been restarted since discharge from the hospital 08/20/17)  2. Hx chest pain; Pt denies at present   Pt underwent nuclear stress test in 2018   This showed anterior changes that could be interpretted as soft tissue attenuation (breast) with no significant ischemia  LVEF was noraml with normal wlall motion  She has never been cathed  CT of chest shows minimal coronary calcification   Follow   Continue statin  3. Acute on chronic renal insufficiency: Cr 4.0 on presentation; baseline has been steadily increasing this year - 2.82.4 03/2017 to 3.5 07/2017. Follows with Dr. Lowanda Foster, last seen 08/25/17 at which time her lasix was increased to 120mg  qAM and 80mg  qPM given c/o LE edema - Recommend nephrology consult for assistance with diuresis given progression of  kidney disease.   4. HTN: BP stable on presentation to the ED; has been difficult to control in the past  - Continue amlodipine, clonidine, and labetalol   5. Anemia: recently referred to hematology outpatient and was scheduled to receive IV iron tomorrow. Hgb 8.5 today (about baseline) - Management per primary team  6. HLD:  - Continue crestor and zetia  7. DM type 2: poorly controlled - last A1C 11 08/18/17; goal <7 - Continue management per primary team  For questions or updates, please contact [place] Please consult [email or web address] for contact info under Cardiology/STEMI.   Signed, Abigail Butts, PA-C  08/31/2017 3:10 PM [phone]   Pt seen and examined   I have amended note above by Teodoro Kil above to reflect my findings   Pt presents to ED with worsening LE edema ON exam:  Pti is morbidly obese.   Neck:  JVP is increased Lungs are Clear with minimal ralres at L base   Cardiac exam:  RRR   Ext with 1 to 2+ edema     Impression: May reflect diastolic  dysfunction in setting of severe renal insufficiency Renal service is followingf and making recommendations for diuresis.   May need dialysis if not effective   Will continue to follow   Dorris Carnes

## 2017-08-31 NOTE — H&P (Signed)
History and Physical    Kathryn Beck TMH:[id number] DOB: 1955/05/19 DOA: 08/31/2017  PCP: Fayrene Helper, MD Consultants:  Lowanda Foster - nephrology; Harl Bowie - cardiology Patient coming from:  Home - lives with husband; NOK: Husband, [phone]; daughter, [phone]  Chief Complaint:  Edema, SOB  HPI: Kathryn Beck is a 62 y.o. female with medical history significant of DM; OSA not on CPAP; obesity; HLD: HTN; and CKD presenting with SOB.   LE edema, very sore, unable to bear weight.  Initially they went to the ER and were hospitalized through the end of May, treated for PNA.  She was d/c on 5/31 and she awoke the next AM with "even more pain" in her right chest, side, and flank.  They went to [facility] and she was discharged; blood work showed >300 proteinuria; 250 glycosuria; small hematuria; WBC 12.2; Hgb 8.5; Plt 483; BUN 34/creatinine 3.91/GFR 14.  She went to Dr. Moshe Cipro that Monday and had blood work and she was sent to Dr. Lowanda Foster.  She saw him that Wednesday and he increased Lasix to 120 mg in the AM and 80 in the evening.  She was able to bear weight at that point, although she did have edema.  He also ordered IV iron and hematology appointment.  Since then, edema is progressive.  She has an appt for IV iron tomorrow in [place].  She is now no longer able to walk, more SOB.  No cough.  She is excessively fatigued.  Poor appetite.   ED Course:  Very difficult diuresis.  Cardiology is consulting.  H/o diastolic CHF with worsening SOB - received IVF and abx in hospital.  [facility] Majestic to [facility] - edema, SOB, increased Lasix but worsening edema and SOB.  Creatinine worse, 4 from 3.5.  Cardiology is consulting.  They will dose the Lasix.  They will also consult nephrology.    Review of Systems: As per HPI; otherwise review of systems reviewed and negative.   Ambulatory Status:  Ambulated without assistance prior  Past Medical History:  Diagnosis Date  . Acid reflux   . Anemia   .  Arthritis   . Axillary masses    Soft tissue - status post excision  . Back pain   . Chronic kidney disease   . Diastolic heart failure ([place])   . Essential hypertension   . Ischemic heart disease    Abnormal Myoview April 2018 - medical therapy  . Mixed hyperlipidemia   . Obesity   . Pancreatitis   . Sleep apnea    Noncompliant with CPAP  . Type 2 diabetes mellitus, uncontrolled ([place])     Past Surgical History:  Procedure Laterality Date  . ABDOMINAL HYSTERECTOMY    . COLONOSCOPY  2008   Dr. Oneida Alar: normal   . COLONOSCOPY N/A 12/18/2016   Dr. Oneida Alar: multiple tubular adenomas, internal hemorrhoids. Surveillance in 3 years   . ESOPHAGEAL DILATION N/A 10/13/2015   Procedure: ESOPHAGEAL DILATION;  Surgeon: Rogene Houston, MD;  Location: [facility];  Service: Endoscopy;  Laterality: N/A;  . ESOPHAGOGASTRODUODENOSCOPY N/A 10/13/2015   Dr. Laural Golden: chronic gastritis on path, no H.pylori. Empiric dilation   . ESOPHAGOGASTRODUODENOSCOPY N/A 12/18/2016   Dr. Oneida Alar: mild gastritis. BRAVO study revealed uncontrolled GERD. Dysphagia secondary to uncontrolled reflux  . FOOT SURGERY Bilateral   . LUNG BIOPSY    . MASS EXCISION Right 01/09/2013   Procedure: EXCISION OF NEOPLASM OF RIGHT  AXILLA  AND EXCISION OF NEOPLASM OF LEFT AXILLA;  Surgeon:  Jamesetta So, MD;  Location: [facility] OR[facility];  Service: General;  Laterality: Right;  procedure end @ 08:23  . MYRINGOTOMY WITH TUBE PLACEMENT Bilateral 04/28/2017   Procedure: BILATERAL MYRINGOTOMY WITH TUBE PLACEMENT;  Surgeon: Leta Baptist, MD;  Location: [place];  Service: ENT;  Laterality: Bilateral;  . SAVORY DILATION N/A 12/18/2016   Procedure: SAVORY DILATION;  Surgeon: Danie Binder, MD;  Location: [facility];  Service: Endoscopy;  Laterality: N/A;    Social History   Socioeconomic History  . Marital status: Married    Spouse name: Not on file  . Number of children: Not on file  . Years of education: Not on file  . Highest education level: Not  on file  Occupational History  . Not on file  Social Needs  . Financial resource strain: Not on file  . Food insecurity:    Worry: Not on file    Inability: Not on file  . Transportation needs:    Medical: Not on file    Non-medical: Not on file  Tobacco Use  . Smoking status: Never Smoker  . Smokeless tobacco: Never Used  Substance and Sexual Activity  . Alcohol use: No  . Drug use: No  . Sexual activity: Not Currently  Lifestyle  . Physical activity:    Days per week: Not on file    Minutes per session: Not on file  . Stress: Not on file  Relationships  . Social connections:    Talks on phone: Not on file    Gets together: Not on file    Attends religious service: Not on file    Active member of club or organization: Not on file    Attends meetings of clubs or organizations: Not on file    Relationship status: Not on file  . Intimate partner violence:    Fear of current or ex partner: Not on file    Emotionally abused: Not on file    Physically abused: Not on file    Forced sexual activity: Not on file  Other Topics Concern  . Not on file  Social History Narrative  . Not on file    Allergies  Allergen Reactions  . Ace Inhibitors Anaphylaxis and Swelling  . Albuterol Swelling  . Penicillins Itching, Swelling and Other (See Comments)    Has patient had a PCN reaction causing immediate rash, facial/tongue/throat swelling, SOB or lightheadedness with hypotension: Yes Has patient had a PCN reaction causing severe rash involving mucus membranes or skin necrosis: No Has patient had a PCN reaction that required hospitalization No Has patient had a PCN reaction occurring within the last 10 years: No If all of the above answers are "NO", then may proceed with Cephalosporin use.   . Statins Other (See Comments)    elevated LFT's    Family History  Problem Relation Age of Onset  . Hypertension Father   . Hypercholesterolemia Father   . Arthritis Father   .  Hypertension Sister   . Hypercholesterolemia Sister   . Breast cancer Sister   . Hypertension Sister   . Colon cancer Neg Hx   . Colon polyps Neg Hx     Prior to Admission medications   Medication Sig Start Date End Date Taking? Authorizing Provider  acetaminophen (TYLENOL) 500 MG tablet Take 1,000 mg by mouth every 6 (six) hours as needed for moderate pain or headache.    [provider]  albuterol (PROVENTIL HFA;VENTOLIN HFA) 108 (90 Base) MCG/ACT inhaler  Inhale 2 puffs into the lungs every 4 (four) hours as needed for wheezing or shortness of breath. 08/15/17   Noemi Chapel, MD  amLODipine (NORVASC) 10 MG tablet Take 1 tablet (10 mg total) by mouth daily. 05/31/17   Fayrene Helper, MD  atorvastatin (LIPITOR) 20 MG tablet Take 1 tablet (20 mg total) by mouth daily. 05/31/17   Fayrene Helper, MD  azelastine (ASTELIN) 0.1 % nasal spray Place 2 sprays into both nostrils 2 (two) times daily. Use in each nostril as directed 05/31/17   Fayrene Helper, MD  calcitRIOL (ROCALTROL) 0.25 MCG capsule Take 0.25 mcg by mouth every Monday, Wednesday, and Friday.  09/15/16   Fayrene Helper, MD  cloNIDine (CATAPRES) 0.1 MG tablet TAKE 1 TABLET BY MOUTH  EVERY EVENING AT 9PM FOR  BLOOD PRESSURE 05/31/17   Fayrene Helper, MD  dexlansoprazole (DEXILANT) 60 MG capsule 1 PO EVERY MORNING WITH BREAKFAST. 04/15/17   Fields, Marga Melnick, MD  dextromethorphan-guaiFENesin (MUCINEX DM) 30-600 MG 12hr tablet Take 1 tablet by mouth 2 (two) times daily. 08/20/17   Barton Dubois, MD  dicyclomine (BENTYL) 10 MG capsule 1 po qachs if needed to prevent diarrhea and abdominal cramps 12/18/16   Fields, Marga Melnick, MD  ezetimibe (ZETIA) 10 MG tablet Take 1 tablet (10 mg total) by mouth daily. 05/31/17   Fayrene Helper, MD  furosemide (LASIX) 40 MG tablet One tablet two times daily, as needed, for leg swelling 07/11/17   Fayrene Helper, MD  gabapentin (NEURONTIN) 100 MG capsule Take 1 capsule (100 mg  total) by mouth 3 (three) times daily. 07/05/17   Edrick Kins, DPM  Insulin Detemir (LEVEMIR) 100 UNIT/ML Pen Inject 100 Units into the skin daily at 10 pm. 03/19/17   [name], MD  Insulin Lispro (HUMALOG KWIKPEN) 200 UNIT/ML SOPN Inject 32-36 Units into the skin 3 (three) times daily before meals. 06/18/17   [name], MD  isosorbide mononitrate (IMDUR) 30 MG 24 hr tablet Take 1 tablet (30 mg total) by mouth daily. 05/31/17   Fayrene Helper, MD  labetalol (NORMODYNE) 200 MG tablet Take 2 tablets (400 mg total) by mouth 2 (two) times daily. 05/31/17   Fayrene Helper, MD  methocarbamol (ROBAXIN) 500 MG tablet Take 1 tablet (500 mg total) by mouth every 8 (eight) hours as needed for muscle spasms. 08/20/17   Barton Dubois, MD  olmesartan (BENICAR) 40 MG tablet Take 1 tablet (40 mg total) by mouth daily. Hold until follow up with PCP 08/20/17   Barton Dubois, MD  Vitamin D, Ergocalciferol, (DRISDOL) 50000 units CAPS capsule Take 1 capsule (50,000 Units total) by mouth every 7 (seven) days. 07/08/17   Fayrene Helper, MD  FLUoxetine (PROZAC) 10 MG capsule Take 10 mg by mouth daily.    05/28/11  [provider]  glipiZIDE (GLUCOTROL) 10 MG tablet Take 10 mg by mouth 2 (two) times daily before a meal.    05/28/11  [provider]    Physical Exam: Vitals:   08/31/17 1330 08/31/17 1338 08/31/17 1627  BP:  120/65 115/62  Pulse:  84 93  Resp:  (!) 22 18  Temp:  98.5 F (36.9 C)   TempSrc:  Oral   SpO2: 97% 98% 96%     General:  Appears calm and comfortable and is NAD but she is somnolent and intermittently sleeping during evaluation Eyes:  PERRL, EOMI, normal lids, iris ENT:  grossly normal hearing, lips & tongue,  mmm Neck:  no LAD, masses or thyromegaly Cardiovascular:  RRR, no m/r/g. Respiratory:   CTA bilaterally with no wheezes/rales/rhonchi.  Normal respiratory effort. Abdomen:  soft, NT, ND, NABS Back:   normal alignment, no CVAT Skin:  no  rash or induration seen on limited exam Musculoskeletal:  grossly normal tone BUE/BLE, good ROM, no bony abnormality Lower extremity: Taut LE edema, borderline anasarca.  Limited foot exam with no ulcerations.  2+ distal pulses. Psychiatric:  grossly normal mood and affect, speech fluent and appropriate, AOx3; she is, however, somnolent Neurologic:  CN 2-12 grossly intact, moves all extremities in coordinated fashion, sensation intact    Radiological Exams on Admission: Dg Chest 2 View  Result Date: 08/31/2017 CLINICAL DATA:  Recent diagnosis of pneumonia. Bilateral lower extremity swelling and pain. EXAM: CHEST - 2 VIEW COMPARISON:  Chest radiograph Aug 18, 2017 FINDINGS: Cardiac silhouette is mildly enlarged. Mediastinal silhouette is not suspicious. Mildly calcified aortic arch. Pulmonary vascular congestion and mild interstitial prominence. LEFT lung base pleural thickening without pleural effusion or focal consolidation. No pneumothorax. Soft tissue planes and included osseous structures are unchanged. IMPRESSION: Mild cardiomegaly. Mild interstitial prominence, possible pulmonary edema. Aortic Atherosclerosis (ICD10-I70.0). Electronically Signed   By: Elon Alas M.D.   On: 08/31/2017 14:12    EKG: Independently reviewed.  NSR with rate 83; nonspecific ST changes with no evidence of acute ischemia; NSCSLT   Labs on Admission: I have personally reviewed the available labs and imaging studies at the time of the admission.  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 (HCC) Active Problems:   Uncontrolled diabetes mellitus with diabetic nephropathy, with long-term current use of insulin (HCC)   HLD (hyperlipidemia)   Sleep apnea   Anemia of chronic disease   Acute  exacerbation of CHF (congestive heart failure) (HCC)   Edema due to hypoalbuminemia   CKD -Patient with progressive CKD, now with volume overload -She has resultant very low albumin, which is leading to third spacing -Nephrology has been consulted; the patient is progressing towards need for HD due to ESRD -For now, will admit for aggressive attempt at diuresis with 120 mg IV Lasix TID - with close nephrology supervision  -Likely needs to d/c home Losartan as well as diclofenac -Continue calcitriol  CHF exacerbation -Resulting from primarily progressive renal failure, the patient has volume overload that causes her to appear to be in CHF -She did receive recent IVF during hospitalization for PNA and this may also be contributing -Cardiology has consulted -Echo is pending -Continue BB -Patient with suspected ischemic heart disease that is being medically managed to avoid use of contrast dye for heart cath -Likely needs to d/c home Losartan -Hold Norvasc due to edema side effect  Anemia of chronic disease -Appears to be fairly stable -She is likely to benefit from erythropoietin but will defer to nephrology  DM -This is likely the underlying etiology for her progressive renal failure -Her last A1c, on 5/29, was 11 -Obviously, she needs improved control -Continue Levemir -Cover with moderate-scale SSI  OSA -Trial of CPAP with autopap  HTN -Hold Norvasc -d/c Losartan -Continue Clonidine and Labetalol -BP is expected to improve with aggressive diuresis and so will not add an additional agent at this time    DVT prophylaxis:  Lovenox  Code Status: Full - confirmed with patient/family Family Communication: Daughter and husband were present throughout entire encounter  Disposition Plan:  Home once clinically improved Consults called: Nephrology; Cardiology; CM; Nutrition  Admission status: Admit - It is my clinical opinion that admission to INPATIENT is reasonable and  necessary because of the expectation that this patient will require hospital care that crosses at least 2 midnights to treat this condition based on the medical complexity of the problems presented.  Given the aforementioned information, the predictability of an adverse outcome is felt to be significant.    Karmen Bongo MD [facility]  If note is complete, please contact covering daytime or nighttime physician. [email or web address] Password [facility]  08/31/2017, 4:56 PM

## 2017-09-01 ENCOUNTER — Encounter (HOSPITAL_COMMUNITY): Payer: Self-pay

## 2017-09-01 ENCOUNTER — Inpatient Hospital Stay (HOSPITAL_COMMUNITY): Payer: 59

## 2017-09-01 ENCOUNTER — Encounter (HOSPITAL_COMMUNITY)
Admission: RE | Admit: 2017-09-01 | Discharge: 2017-09-01 | Disposition: A | Discharge status: Home / Self Care | Payer: 59 | Source: Ambulatory Visit | Attending: Nephrology | Admitting: Nephrology

## 2017-09-01 DIAGNOSIS — E1165 Type 2 diabetes mellitus with hyperglycemia: Secondary | ICD-10-CM

## 2017-09-01 DIAGNOSIS — N185 Chronic kidney disease, stage 5: Secondary | ICD-10-CM

## 2017-09-01 DIAGNOSIS — N189 Chronic kidney disease, unspecified: Secondary | ICD-10-CM | POA: Insufficient documentation

## 2017-09-01 DIAGNOSIS — I5030 Unspecified diastolic (congestive) heart failure: Secondary | ICD-10-CM

## 2017-09-01 DIAGNOSIS — R609 Edema, unspecified: Secondary | ICD-10-CM

## 2017-09-01 DIAGNOSIS — E782 Mixed hyperlipidemia: Secondary | ICD-10-CM

## 2017-09-01 DIAGNOSIS — E119 Type 2 diabetes mellitus without complications: Secondary | ICD-10-CM

## 2017-09-01 DIAGNOSIS — D638 Anemia in other chronic diseases classified elsewhere: Secondary | ICD-10-CM

## 2017-09-01 DIAGNOSIS — K219 Gastro-esophageal reflux disease without esophagitis: Secondary | ICD-10-CM | POA: Insufficient documentation

## 2017-09-01 DIAGNOSIS — M1990 Unspecified osteoarthritis, unspecified site: Secondary | ICD-10-CM | POA: Insufficient documentation

## 2017-09-01 DIAGNOSIS — I5032 Chronic diastolic (congestive) heart failure: Secondary | ICD-10-CM | POA: Insufficient documentation

## 2017-09-01 DIAGNOSIS — Z794 Long term (current) use of insulin: Secondary | ICD-10-CM

## 2017-09-01 DIAGNOSIS — K861 Other chronic pancreatitis: Secondary | ICD-10-CM | POA: Insufficient documentation

## 2017-09-01 DIAGNOSIS — Z01810 Encounter for preprocedural cardiovascular examination: Secondary | ICD-10-CM

## 2017-09-01 DIAGNOSIS — E8809 Other disorders of plasma-protein metabolism, not elsewhere classified: Secondary | ICD-10-CM

## 2017-09-01 DIAGNOSIS — E1121 Type 2 diabetes mellitus with diabetic nephropathy: Secondary | ICD-10-CM

## 2017-09-01 LAB — CBC WITH DIFFERENTIAL/PLATELET
Abs Immature Granulocytes: 0.1 10*3/uL (ref 0.0–0.1)
BASOS ABS: 0 10*3/uL (ref 0.0–0.1)
BASOS PCT: 0 %
EOS ABS: 0.1 10*3/uL (ref 0.0–0.7)
EOS PCT: 1 %
HCT: 23 % — ABNORMAL LOW (ref 36.0–46.0)
HEMOGLOBIN: 6.8 g/dL — AB (ref 12.0–15.0)
Immature Granulocytes: 1 %
Lymphocytes Relative: 19 %
Lymphs Abs: 2.6 10*3/uL (ref 0.7–4.0)
MCH: 21.7 pg — AB (ref 26.0–34.0)
MCHC: 29.6 g/dL — AB (ref 30.0–36.0)
MCV: 73.2 fL — ABNORMAL LOW (ref 78.0–100.0)
MONO ABS: 1.6 10*3/uL — AB (ref 0.1–1.0)
Monocytes Relative: 11 %
Neutro Abs: 9.5 10*3/uL — ABNORMAL HIGH (ref 1.7–7.7)
Neutrophils Relative %: 68 %
Platelets: 363 10*3/uL (ref 150–400)
RBC: 3.14 MIL/uL — ABNORMAL LOW (ref 3.87–5.11)
RDW: 15.4 % (ref 11.5–15.5)
WBC: 13.8 10*3/uL — AB (ref 4.0–10.5)

## 2017-09-01 LAB — BASIC METABOLIC PANEL
Anion gap: 10 (ref 5–15)
BUN: 45 mg/dL — AB (ref 6–20)
CHLORIDE: 109 mmol/L (ref 101–111)
CO2: 24 mmol/L (ref 22–32)
Calcium: 8.3 mg/dL — ABNORMAL LOW (ref 8.9–10.3)
Creatinine, Ser: 4.45 mg/dL — ABNORMAL HIGH (ref 0.44–1.00)
GFR calc Af Amer: 11 mL/min — ABNORMAL LOW (ref >60)
GFR calc non Af Amer: 10 mL/min — ABNORMAL LOW (ref >60)
GLUCOSE: 78 mg/dL (ref 65–99)
POTASSIUM: 4.2 mmol/L (ref 3.5–5.1)
SODIUM: 143 mmol/L (ref 135–145)

## 2017-09-01 LAB — ECHOCARDIOGRAM COMPLETE
Height: 64 in
Weight: 3418.01 oz

## 2017-09-01 LAB — IRON AND TIBC
IRON: 7 ug/dL — AB (ref 28–170)
Saturation Ratios: 4 % — ABNORMAL LOW (ref 10.4–31.8)
TIBC: 158 ug/dL — AB (ref 250–450)
UIBC: 151 ug/dL

## 2017-09-01 LAB — FERRITIN: Ferritin: 288 ng/mL (ref 11–307)

## 2017-09-01 LAB — GLUCOSE, CAPILLARY
GLUCOSE-CAPILLARY: 137 mg/dL — AB (ref 65–99)
GLUCOSE-CAPILLARY: 211 mg/dL — AB (ref 65–99)
Glucose-Capillary: 94 mg/dL (ref 65–99)
Glucose-Capillary: 264 mg/dL — ABNORMAL HIGH (ref 65–99)

## 2017-09-01 LAB — HEMOGLOBIN AND HEMATOCRIT, BLOOD
HCT: 25.9 % — ABNORMAL LOW (ref 36.0–46.0)
Hemoglobin: 7.8 g/dL — ABNORMAL LOW (ref 12.0–15.0)

## 2017-09-01 LAB — ABO/RH: ABO/RH(D): B POS

## 2017-09-01 LAB — PROTEIN / CREATININE RATIO, URINE
CREATININE, URINE: 64.12 mg/dL
PROTEIN CREATININE RATIO: 8.69 mg/mg{creat} — AB (ref 0.00–0.15)
Total Protein, Urine: 557 mg/dL

## 2017-09-01 LAB — PREPARE RBC (CROSSMATCH)

## 2017-09-01 MED ORDER — VANCOMYCIN HCL IN DEXTROSE 1-5 GM/200ML-% IV SOLN
1000.0000 mg | INTRAVENOUS | Status: AC
  Administered 2017-09-02: 1000 mg via INTRAVENOUS
  Filled 2017-09-01: qty 200

## 2017-09-01 MED ORDER — SODIUM CHLORIDE 0.9 % IV SOLN: Freq: Once | INTRAVENOUS | Status: DC

## 2017-09-01 NOTE — Progress Notes (Signed)
Progress Note  Patient Name: ENDIA MONCUR Date of Encounter: 09/01/2017  Primary Cardiologist: Rozann Lesches, MD   Subjective   No CP  Breathing is unchanged    Inpatient Medications    Scheduled Meds: . aspirin EC  81 mg Oral Daily  . atorvastatin  20 mg Oral Daily  . calcitRIOL  0.25 mcg Oral Q M,W,F  . cloNIDine  0.1 mg Oral Q24H  . enoxaparin (LOVENOX) injection  30 mg Subcutaneous Q24H  . ezetimibe  10 mg Oral Daily  . gabapentin  100 mg Oral TID  . insulin aspart  0-15 Units Subcutaneous TID WC  . insulin aspart  0-5 Units Subcutaneous QHS  . isosorbide mononitrate  30 mg Oral Daily  . labetalol  400 mg Oral BID  . pantoprazole  40 mg Oral Daily  . sodium chloride flush  3 mL Intravenous Q12H   Continuous Infusions: . sodium chloride    . sodium chloride Stopped (08/31/17 1908)  . sodium chloride    . ferumoxytol    . furosemide 120 mg (09/01/17 3790)   PRN Meds: sodium chloride, acetaminophen, albuterol, calcium carbonate (dosed in mg elemental calcium), camphor-menthol **AND** hydrOXYzine, docusate sodium, feeding supplement (NEPRO CARB STEADY), methocarbamol, ondansetron (ZOFRAN) IV, sodium chloride flush, sorbitol, zolpidem   Vital Signs    Vitals:   09/01/17 0300 09/01/17 0558 09/01/17 0700 09/01/17 0750  BP: 111/64 123/68  138/67  Pulse: 85 93  91  Resp: 18 16  17   Temp: 99.3 F (37.4 C) 98.7 F (37.1 C)  98.7 F (37.1 C)  TempSrc: Oral Oral  Oral  SpO2: 97% 96%  98%  Weight:   96.9 kg (213 lb 10 oz)   Height:        Intake/Output Summary (Last 24 hours) at 09/01/2017 0914 Last data filed at 09/01/2017 0711 Gross per 24 hour  Intake 485.74 ml  Output 900 ml  Net -414.26 ml   Filed Weights   08/31/17 2014 09/01/17 0700  Weight: 95.8 kg (211 lb 3.2 oz) 96.9 kg (213 lb 10 oz)    Telemetry    SR  - Personally Reviewed  ECG      Physical Exam   GEN: No acute distress.   Neck: JVP is increased    Cardiac: RRR, no murmurs,  rubs, or gallops.  Respiratory: Rales at R base   GI: Soft, nontender, non-distended  MS: 1+ edema; No deformity. Neuro:  Nonfocal  Psych: Normal affect   Labs    Chemistry Recent Labs  Lab 08/31/17 1343 08/31/17 1352 09/01/17 0542  NA 145 144 143  K 3.7 3.7 4.2  CL 107 106 109  CO2 26  --  24  GLUCOSE 202* 208* 78  BUN 42* 42* 45*  CREATININE 4.32* 4.00* 4.45*  CALCIUM 8.8*  --  8.3*  PROT 6.3*  --   --   ALBUMIN 2.0*  --   --   AST 16  --   --   ALT 18  --   --   ALKPHOS 94  --   --   BILITOT 0.5  --   --   GFRNONAA 10*  --  10*  GFRAA 12*  --  11*  ANIONGAP 12  --  10     Hematology Recent Labs  Lab 08/31/17 1343 08/31/17 1352 08/31/17 2330 09/01/17 0747  WBC 14.0*  --  13.8*  --   RBC 3.58*  --  3.14*  --  HGB 7.7* 8.5* 6.8* 7.8*  HCT 26.5* 25.0* 23.0* 25.9*  MCV 74.0*  --  73.2*  --   MCH 21.5*  --  21.7*  --   MCHC 29.1*  --  29.6*  --   RDW 15.9*  --  15.4  --   PLT 403*  --  363  --     Cardiac EnzymesNo results for input(s): TROPONINI in the last 168 hours.  Recent Labs  Lab 08/31/17 1350  TROPIPOC 0.04     BNP Recent Labs  Lab 08/31/17 1343  BNP 84.5     DDimer No results for input(s): DDIMER in the last 168 hours.   Radiology    Dg Chest 2 View  Result Date: 08/31/2017 CLINICAL DATA:  Recent diagnosis of pneumonia. Bilateral lower extremity swelling and pain. EXAM: CHEST - 2 VIEW COMPARISON:  Chest radiograph Aug 18, 2017 FINDINGS: Cardiac silhouette is mildly enlarged. Mediastinal silhouette is not suspicious. Mildly calcified aortic arch. Pulmonary vascular congestion and mild interstitial prominence. LEFT lung base pleural thickening without pleural effusion or focal consolidation. No pneumothorax. Soft tissue planes and included osseous structures are unchanged. IMPRESSION: Mild cardiomegaly. Mild interstitial prominence, possible pulmonary edema. Aortic Atherosclerosis (ICD10-I70.0). Electronically Signed   By: Elon Alas  M.D.   On: 08/31/2017 14:12    Cardiac Studies    Patient Profile     61 y.o. female hx of CP, diastolic CHF, CKD (Cr 4) admitted with worsening LE edema    Assessment & Plan    1  Acute on chronic diastolic CHF On IV lasix   Wt and I/O up from yesterday    Cr elevated today at 4.45   Renal is seeling patient Will wait for furhter recommendations   Echo pending to reassess LVEF    2  CKD  Cr up to 4.45   Renal is seein pt   3  Hx CP  No CP at present   No documented CAD   (Review of myovue from 2018, not cliearly infarct.   Sugg of breast attenuation   LVEF normal at time)  4  HTN  BP is well controlled    5  Anemia  Hg 7.7 yesterday then 6.8   Now 7.8   Defer to primary team  Was sched for Fe infusion   ? GI eval   Defer work up to primary service   Goal to keep closer to 8 or higher   6  HL    For questions or updates, please contact [place] Please consult [email or web address] for contact info under Cardiology/STEMI.      Signed, Dorris Carnes, MD  09/01/2017, 9:14 AM

## 2017-09-01 NOTE — Progress Notes (Signed)
CRITICAL VALUE ALERT  Critical Value:  Hemoglobin 6.8  Date & Time Notied:  09/01/17 0044  Provider Notified: Lamar Blinks  Orders Received/Actions taken: awaiting

## 2017-09-01 NOTE — Progress Notes (Signed)
Inpatient Diabetes Program Recommendations  AACE/ADA: New Consensus Statement on Inpatient Glycemic Control (2015)  Target Ranges:  Prepandial:   less than 140 mg/dL      Peak postprandial:   less than 180 mg/dL (1-2 hours)      Critically ill patients:  140 - 180 mg/dL   Review of Glycemic Control  Diabetes history: DM 2 Outpatient Diabetes medications: Levemir 100 units Daily, Humalog 32-36 units tid with meals Current orders for Inpatient glycemic control: Novolog Moderate Correction 0-15 units tid + Novolog HS scale 0-5 units  Inpatient Diabetes Program Recommendations:    Due to increased renal function, please decrease Novolog Correction to sensitive 0-9 units tid. Noted patient has not had insulin since admission. Patient is on a large amount of insulin at home. Patient will need to contact and follow up with Dr. Cruzita Lederer outpatient for insulin dosing when discharged as patient would not need to be on such high doses.  Once glucose reaches >180 mg/dl all the time would consider a portion of patients home Levemir that is weight based.  Thanks,  Tama Headings RN, MSN, BC-ADM, [facility] Inpatient Diabetes Coordinator Team Pager [phone] (8a-5p)

## 2017-09-01 NOTE — Care Management Note (Signed)
Case Management Note  Patient Details  Name: Kathryn Beck MRN: [id number] Date of Birth: 11/12/1955  Subjective/Objective:  CKD                 Action/Plan: Patient lives at home; PCP: Fayrene Helper, MD; has private insurance with [facility] with prescription drug coverage; patient is for vein mapping today; CM will continue to follow for progression of care.   Expected Discharge Date:  Possibly 09/05/2017             Expected Discharge Plan:  [place] possibly, will await for Physical Therapy eval for disposition needs  Discharge planning Services  CM Consult  Status of Service:  In process, will continue to follow  Sherrilyn Rist [phone] 09/01/2017, 3:21 PM

## 2017-09-01 NOTE — Progress Notes (Signed)
Bilateral Upper Extremity Vein Map  Right Upper Extremity Vein Map  Cephalic  Segment Diameter Depth Comment  1. Axilla 0.50 cm 1.19 cm   2. Mid upper arm 0.58 cm 0.32 cm   3. Above AC 0.55 cm 0.58 cm Branch  4. In AC 0.70 cm 0.30 cm Branch  5. Below AC 0.64 cm 0.76 cm Branch  6. Mid forearm 0.38 cm 0.79 cm   7. Wrist 0.33 cm 0.42 cm    Left Upper Extremity Vein Map  Cephalic  Segment Diameter Depth Comment  1. Axilla 0.50 cm 1.46 cm   2. Mid upper arm 0.53 cm 0.61 cm Branch  3. Above AC 0.61 cm 0.38 cm   4. In AC 0.62 cm 0.69 cm Branch  5. Below AC 0.37 cm 1.10 cm Branch  6. Mid forearm 0.31 cm 0.29 cm   7. Wrist 0.35 cm 0.40 cm    09/01/17 4:31 PM Kathryn Beck RVT

## 2017-09-01 NOTE — Evaluation (Signed)
Physical Therapy Evaluation Patient Details Name: Kathryn Beck MRN: [id number] DOB: 1955/04/04 Today's Date: 09/01/2017   History of Present Illness  62 y.o. female admitted 6/11 with SOB and BLE swelling. PMH includes: HTN, Diastolic CHF, DM2, Anemia, CKD, Foot surgery.     Clinical Impression  Pt admitted with above diagnosis. Pt currently with functional limitations due to the deficits listed below (see PT Problem List). PTA, pt independent living with husband, going to OP PT. Upon eval pt limited with high levels of BLE pain. Refusing transfers and OOB mobility this session citing 10/10 pain with motion. Focused on education and bed level therex. Anticipate patient will progress well with therapy once pain is resolved, and be able to safely return home. If patient does not improve will need ST-SNF to recover.   Pt will benefit from skilled PT to increase their independence and safety with mobility to allow discharge to the venue listed below.   .        Follow Up Recommendations Home health PT;Supervision/Assistance - 24 hour (pending progress).     Equipment Recommendations  (TBD)    Recommendations for Other Services OT consult     Precautions / Restrictions Precautions Precautions: Fall Restrictions Weight Bearing Restrictions: No      Mobility  Bed Mobility Overal bed mobility: Needs Assistance             General bed mobility comments: Patient unable to tolerate bed mobility this visit 2/2 pain   Transfers                    Ambulation/Gait             General Gait Details: defered 2/2 10/10 pain in BLE   Stairs            Wheelchair Mobility    Modified Rankin (Stroke Patients Only)       Balance Overall balance assessment: (N/T)                                           Pertinent Vitals/Pain Pain Assessment: Faces Faces Pain Scale: Hurts a little bit Pain Location: BLE Pain Descriptors / Indicators:  Aching;Throbbing Pain Intervention(s): Limited activity within patient's tolerance;Monitored during session;Repositioned    Home Living Family/patient expects to be discharged to:: Private residence Living Arrangements: Spouse/significant other Available Help at Discharge: Family;Available 24 hours/day Type of Home: House Home Access: Stairs to enter Entrance Stairs-Rails: Can reach both Entrance Stairs-Number of Steps: 2 Home Layout: One level Home Equipment: Tub bench;Walker - 2 wheels      Prior Function Level of Independence: Independent         Comments: Community ambulation, driving, independent with ADL, going to OP PT for BLE weakness.      Hand Dominance        Extremity/Trunk Assessment   Upper Extremity Assessment Upper Extremity Assessment: Overall WFL for tasks assessed    Lower Extremity Assessment Lower Extremity Assessment: (N/T due to pain)       Communication   Communication: No difficulties  Cognition Arousal/Alertness: Awake/alert Behavior During Therapy: WFL for tasks assessed/performed Overall Cognitive Status: Within Functional Limits for tasks assessed  General Comments General comments (skin integrity, edema, etc.): pt and family report improvement in BLE edema    Exercises General Exercises - Lower Extremity Ankle Circles/Pumps: 10 reps Quad Sets: 10 reps Heel Slides: 10 reps Hip ABduction/ADduction: 10 reps Straight Leg Raises: 5 reps;AAROM   Assessment/Plan    PT Assessment Patient needs continued PT services  PT Problem List Decreased strength;Decreased range of motion;Decreased activity tolerance;Decreased mobility;Pain;Obesity       PT Treatment Interventions DME instruction;Gait training;Functional mobility training;Stair training;Therapeutic exercise;Therapeutic activities    PT Goals (Current goals can be found in the Care Plan section)  Acute Rehab PT  Goals Patient Stated Goal: reduce pain, return home with husband care PT Goal Formulation: With patient Time For Goal Achievement: 09/08/17 Potential to Achieve Goals: Fair    Frequency Min 3X/week   Barriers to discharge        Co-evaluation               AM-PAC PT "6 Clicks" Daily Activity  Outcome Measure Difficulty turning over in bed (including adjusting bedclothes, sheets and blankets)?: Unable Difficulty moving from lying on back to sitting on the side of the bed? : Unable Difficulty sitting down on and standing up from a chair with arms (e.g., wheelchair, bedside commode, etc,.)?: Unable Help needed moving to and from a bed to chair (including a wheelchair)?: Total Help needed walking in hospital room?: Total Help needed climbing 3-5 steps with a railing? : Total 6 Click Score: 6    End of Session   Activity Tolerance: Patient limited by pain Patient left: in bed;with call bell/phone within reach;with nursing/sitter in room Nurse Communication: Mobility status PT Visit Diagnosis: Pain;Muscle weakness (generalized) (M62.81) Pain - Right/Left: (BLE) Pain - part of body: Leg    Time: 1445-1510 PT Time Calculation (min) (ACUTE ONLY): 25 min   Charges:   PT Evaluation $PT Eval Low Complexity: 1 Low PT Treatments $Therapeutic Exercise: 8-22 mins   PT G Codes:        Reinaldo Berber, PT, DPT Acute Rehab Services Pager: [phone]    Reinaldo Berber 09/01/2017, 3:41 PM

## 2017-09-01 NOTE — Plan of Care (Signed)
Nutrition Education Note  RD consulted for nutrition education regarding CHF.  Lab Results  Component Value Date   HGBA1C 11.0 (H) 08/18/2017   CBGS: 94. Inpatient orders for glycemic control are 0-15 units insulin aspart TID with meals and 0-5 units insulin aspart q HS.   PTA DM medications are 100 units insulin determir q HS, 32-36 insulin lispro TID, and 10 mg gluctorol BID (however, pt reports taking only insulins PTA).   Spoke with pt at bedside, who reports declining appetite over the past week related to taste changes. Pt shares "nothing tastes right". Pt usually consumes 3 meals per day (Breakfast: eggs with bacon or hot dog OR cereal (cheerios) OR grits and water; Lunch: sandwich OR salad OR vegetable plate; DInner: similar to lunch). Commonly consumed beverages are water, low calorie drink mixes, and regular soda (which she reports she consumes 3 times per week at maximum).   Pt reports her DM has always been poorly controlled. She recently switched endocrinology providers to  Dr, Cruzita Lederer in [place]. Pt shares minimal improvement in blood sugar control "by trying to watch what I eat". Pt is waiting to schedule bariatric surgery (she has already been approved per her report). Her her report she is trying to follow her bariatric diet, however, her intake has drastically changed over the past 1-2 weeks as pt does not feel well enough to cook, so she has been going out to eat more often (commonly visited restaurants are placed such as [employer], [employer], and [employer]). Pt shares she tries to get vegetables or a grilled chicken salad, as she is trying to reduce intake on fried foods.   Had long discussion with pt about how excess sodium in diet can lead to fluid overload. Discussed ways pt could decrease sodium in diet, including rinsing canned vegetables (or using frozen or fresh), sodium-free seasonings, and healthier alternatives when eating out. Encouraged continued  follow-up with outpatient RD for further support.   RD provided "Heart Healthy, Consistent Carbohydrate Nutrition Therapy" handout from the Academy of [employer]. Reviewed patient's dietary recall. Provided examples on ways to decrease sodium intake in diet. Discouraged intake of processed foods and use of salt shaker. Encouraged fresh fruits and vegetables as well as whole grain sources of carbohydrates to maximize fiber intake.   RD discussed why it is important for patient to adhere to diet recommendations, and emphasized the role of fluids, foods to avoid, and importance of weighing self daily.   Discussed different food groups and their effects on blood sugar, emphasizing carbohydrate-containing foods. Provided list of carbohydrates and recommended serving sizes of common foods.  Discussed importance of controlled and consistent carbohydrate intake throughout the day. Provided examples of ways to balance meals/snacks and encouraged intake of high-fiber, whole grain complex carbohydrates. Teach back method used.  Expect fair to poor compliance.  Body mass index is 36.67 kg/m. Pt meets criteria for obesity, class II based on current BMI.  Current diet order is Heart Healthy/ carb modified, patient is consuming approximately 50% of meals at this time. Labs and medications reviewed. No further nutrition interventions warranted at this time. RD contact information provided. If additional nutrition issues arise, please re-consult RD.   Kathryn Beck A. Kathryn Beck, RD, LDN, CDE Pager: [phone] After hours Pager: [phone]

## 2017-09-01 NOTE — Consult Note (Addendum)
Hospital Consult    Reason for Consult:  In need of dialysis access Requesting Physician:  Joelyn Oms MRN #:  [id number]  History of Present Illness: This is a 62 y.o. female who presented to the ED yesterday with increasing shortness of breath and lower extremity swelling.  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  She has a hx of OSA but is not compliant with her CPAP.  The pt is on a statin for cholesterol management. She is on insulin for diabetes.  PTA, she was on a beta blocker, ARB, & CCB for blood pressure control.    She is right hand dominant.   Past Medical History:  Diagnosis Date  . Acid reflux   . Anemia   . Arthritis   . Axillary masses    Soft tissue - status post excision  . Back pain   . Chronic kidney disease   . Diastolic heart failure ([place])   . Essential hypertension   . Ischemic heart disease    Abnormal Myoview April 2018 - medical therapy  . Mixed hyperlipidemia   . Obesity   . Pancreatitis   . Sleep apnea    Noncompliant with CPAP  . Type 2 diabetes mellitus, uncontrolled ([place])     Past Surgical History:  Procedure Laterality Date  . ABDOMINAL HYSTERECTOMY    . COLONOSCOPY  2008   Dr. Oneida Alar: normal   . COLONOSCOPY N/A 12/18/2016   Dr. Oneida Alar: multiple tubular adenomas, internal hemorrhoids. Surveillance in 3 years   . ESOPHAGEAL DILATION N/A 10/13/2015   Procedure: ESOPHAGEAL DILATION;  Surgeon: Rogene Houston, MD;  Location: [facility];  Service: Endoscopy;  Laterality: N/A;  . ESOPHAGOGASTRODUODENOSCOPY N/A 10/13/2015   Dr. Laural Golden: chronic gastritis on path, no H.pylori. Empiric dilation   . ESOPHAGOGASTRODUODENOSCOPY N/A 12/18/2016   Dr. Oneida Alar: mild gastritis. BRAVO study revealed uncontrolled GERD. Dysphagia secondary to uncontrolled reflux  . FOOT SURGERY Bilateral   . LUNG BIOPSY    . MASS EXCISION Right 01/09/2013   Procedure: EXCISION OF NEOPLASM OF RIGHT  AXILLA  AND EXCISION OF NEOPLASM OF LEFT AXILLA;  Surgeon: Jamesetta So,  MD;  Location: [facility];  Service: General;  Laterality: Right;  procedure end @ 08:23  . MYRINGOTOMY WITH TUBE PLACEMENT Bilateral 04/28/2017   Procedure: BILATERAL MYRINGOTOMY WITH TUBE PLACEMENT;  Surgeon: Leta Baptist, MD;  Location: [facility];  Service: ENT;  Laterality: Bilateral;  . SAVORY DILATION N/A 12/18/2016   Procedure: SAVORY DILATION;  Surgeon: Danie Binder, MD;  Location: [facility];  Service: Endoscopy;  Laterality: N/A;    Allergies  Allergen Reactions  . Ace Inhibitors Anaphylaxis and Swelling  . Albuterol Swelling  . Penicillins Itching, Swelling and Other (See Comments)    Has patient had a PCN reaction causing immediate rash, facial/tongue/throat swelling, SOB or lightheadedness with hypotension: Yes Has patient had a PCN reaction causing severe rash involving mucus membranes or skin necrosis: No Has patient had a PCN reaction that required hospitalization No Has patient had a PCN reaction occurring within the last 10 years: No If all of the above answers are "NO", then may proceed with Cephalosporin use.   . Statins Other (See Comments)    elevated LFT's    Prior to Admission medications   Medication Sig Start Date End Date Taking? Authorizing Provider  acetaminophen (TYLENOL) 500 MG tablet Take 1,000 mg by mouth every 6 (six) hours as needed for moderate pain or headache.   Yes [provider]  albuterol (PROVENTIL HFA;VENTOLIN HFA) 108 (90 Base) MCG/ACT inhaler Inhale 2 puffs into the lungs every 4 (four) hours as needed for wheezing or shortness of breath. 08/15/17  Yes Noemi Chapel, MD  amLODipine (NORVASC) 10 MG tablet Take 1 tablet (10 mg total) by mouth daily. 05/31/17  Yes Fayrene Helper, MD  atorvastatin (LIPITOR) 20 MG tablet Take 1 tablet (20 mg total) by mouth daily. 05/31/17  Yes Fayrene Helper, MD  azelastine (ASTELIN) 0.1 % nasal spray Place 2 sprays into both nostrils 2 (two) times daily. Use in each nostril as directed Patient taking  differently: Place 2 sprays into both nostrils 2 (two) times daily as needed for rhinitis or allergies. Use in each nostril as directed 05/31/17  Yes Fayrene Helper, MD  calcitRIOL (ROCALTROL) 0.25 MCG capsule Take 0.25 mcg by mouth every Monday, Wednesday, and Friday.  09/15/16  Yes Fayrene Helper, MD  cloNIDine (CATAPRES) 0.1 MG tablet TAKE 1 TABLET BY MOUTH  EVERY EVENING AT 9PM FOR  BLOOD PRESSURE Patient taking differently: Take 0.1 mg by mouth every evening.  05/31/17  Yes Fayrene Helper, MD  dexlansoprazole (DEXILANT) 60 MG capsule 1 PO EVERY MORNING WITH BREAKFAST. Patient taking differently: Take 60 mg by mouth daily. . 04/15/17  Yes Fields, Sandi L, MD  dextromethorphan-guaiFENesin (MUCINEX DM) 30-600 MG 12hr tablet Take 1 tablet by mouth 2 (two) times daily. Patient taking differently: Take 1 tablet by mouth daily.  08/20/17  Yes Barton Dubois, MD  dicyclomine (BENTYL) 10 MG capsule 1 po qachs if needed to prevent diarrhea and abdominal cramps Patient taking differently: Take 10 mg by mouth at bedtime as needed. as needed to prevent diarrhea and abdominal cramps. 12/18/16  Yes Fields, Sandi L, MD  ezetimibe (ZETIA) 10 MG tablet Take 1 tablet (10 mg total) by mouth daily. 05/31/17  Yes Fayrene Helper, MD  furosemide (LASIX) 40 MG tablet One tablet two times daily, as needed, for leg swelling Patient taking differently: Take 40 mg by mouth 2 (two) times daily.  07/11/17  Yes Fayrene Helper, MD  gabapentin (NEURONTIN) 100 MG capsule Take 1 capsule (100 mg total) by mouth 3 (three) times daily. 07/05/17  Yes Edrick Kins, DPM  Insulin Detemir (LEVEMIR) 100 UNIT/ML Pen Inject 100 Units into the skin daily at 10 pm. Patient taking differently: Inject 100 Units into the skin daily.  03/19/17  Yes [name], MD  Insulin Lispro (HUMALOG KWIKPEN) 200 UNIT/ML SOPN Inject 32-36 Units into the skin 3 (three) times daily before meals. Patient taking differently: Inject 32-36  Units into the skin 3 (three) times daily before meals. Per sliding scale 06/18/17  Yes [name], MD  isosorbide mononitrate (IMDUR) 30 MG 24 hr tablet Take 1 tablet (30 mg total) by mouth daily. 05/31/17  Yes Fayrene Helper, MD  labetalol (NORMODYNE) 200 MG tablet Take 2 tablets (400 mg total) by mouth 2 (two) times daily. 05/31/17  Yes Fayrene Helper, MD  methocarbamol (ROBAXIN) 500 MG tablet Take 1 tablet (500 mg total) by mouth every 8 (eight) hours as needed for muscle spasms. 08/20/17  Yes Barton Dubois, MD  olmesartan (BENICAR) 40 MG tablet Take 1 tablet (40 mg total) by mouth daily. Hold until follow up with PCP Patient taking differently: Take 40 mg by mouth every evening.  08/20/17  Yes Barton Dubois, MD  Vitamin D, Ergocalciferol, (DRISDOL) 50000 units CAPS capsule Take 1 capsule (50,000 Units total) by mouth  every 7 (seven) days. Patient taking differently: Take 50,000 Units by mouth every 7 (seven) days. Take every Tuesday 07/08/17  Yes Fayrene Helper, MD  FLUoxetine (PROZAC) 10 MG capsule Take 10 mg by mouth daily.    05/28/11  [provider]  glipiZIDE (GLUCOTROL) 10 MG tablet Take 10 mg by mouth 2 (two) times daily before a meal.    05/28/11  [provider]    Social History   Socioeconomic History  . Marital status: Married    Spouse name: Not on file  . Number of children: Not on file  . Years of education: Not on file  . Highest education level: Not on file  Occupational History  . Not on file  Social Needs  . Financial resource strain: Not on file  . Food insecurity:    Worry: Not on file    Inability: Not on file  . Transportation needs:    Medical: Not on file    Non-medical: Not on file  Tobacco Use  . Smoking status: Never Smoker  . Smokeless tobacco: Never Used  Substance and Sexual Activity  . Alcohol use: No  . Drug use: No  . Sexual activity: Not Currently  Lifestyle  . Physical activity:    Days per week: Not on  file    Minutes per session: Not on file  . Stress: Not on file  Relationships  . Social connections:    Talks on phone: Not on file    Gets together: Not on file    Attends religious service: Not on file    Active member of club or organization: Not on file    Attends meetings of clubs or organizations: Not on file    Relationship status: Not on file  . Intimate partner violence:    Fear of current or ex partner: Not on file    Emotionally abused: Not on file    Physically abused: Not on file    Forced sexual activity: Not on file  Other Topics Concern  . Not on file  Social History Narrative  . Not on file     Family History  Problem Relation Age of Onset  . Hypertension Father   . Hypercholesterolemia Father   . Arthritis Father   . Hypertension Sister   . Hypercholesterolemia Sister   . Breast cancer Sister   . Hypertension Sister   . Colon cancer Neg Hx   . Colon polyps Neg Hx     ROS: [x]  Positive   [ ]  Negative   [ ]  All sytems reviewed and are negative  Cardiac: []  chest pain/pressure []  palpitations [x]  SOB  [x]  DOE [x]  CAD  Vascular: []  pain in legs while walking []  pain in legs at rest []  pain in legs at night []  non-healing ulcers []  hx of DVT [x]  swelling in legs  Pulmonary: []  productive cough []  asthma/wheezing []  home O2 [x]  OSA  Neurologic: []  weakness in []  arms []  legs []  numbness in []  arms []  legs []  hx of CVA []  mini stroke [] difficulty speaking or slurred speech []  temporary loss of vision in one eye []  dizziness  Hematologic: []  hx of cancer []  bleeding problems []  problems with blood clotting easily  Endocrine:   [x]  diabetes []  thyroid disease  GI []  vomiting blood []  blood in stool [x]  acid reflux  GU: [x]  CKD/renal failure []  HD--[]  M/W/F or []  T/T/S []  burning with urination []  blood in urine  Psychiatric: []   anxiety []  depression  Musculoskeletal: [x]  arthritis []  joint pain  Integumentary: []   rashes []  ulcers  Constitutional: []  fever []  chills   Physical Examination  Vitals:   09/01/17 0750 09/01/17 1028  BP: 138/67 136/62  Pulse: 91 100  Resp: 17   Temp: 98.7 F (37.1 C)   SpO2: 98% 96%   Body mass index is 36.67 kg/m.  General:  WDWN in NAD Gait: Not observed HENT: WNL, normocephalic Pulmonary: normal non-labored breathing, without Rales, rhonchi,  wheezing Cardiac: regula Abdomen:  obese Skin: without rashes Vascular Exam/Pulses:  Right Left  Radial 2+ (normal) 2+ (normal)  Ulnar 2+ (normal) 2+ (normal)  DP 2+ (normal) 2+ (normal)  PT Unable to palpate  Unable to palpate    Extremities: without ischemic changes, without Gangrene , without cellulitis; without open wounds; BLE edema; Iv in left forearm Musculoskeletal: no muscle wasting or atrophy  Neurologic: A&O X 3;  No focal weakness or paresthesias are detected; speech is fluent/normal Psychiatric:  The pt has Normal affect.  CBC    Component Value Date/Time   WBC 13.8 (H) 08/31/2017 2330   RBC 3.14 (L) 08/31/2017 2330   HGB 7.8 (L) 09/01/2017 0747   HCT 25.9 (L) 09/01/2017 0747   PLT 363 08/31/2017 2330   MCV 73.2 (L) 08/31/2017 2330   MCH 21.7 (L) 08/31/2017 2330   MCHC 29.6 (L) 08/31/2017 2330   RDW 15.4 08/31/2017 2330   LYMPHSABS 2.6 08/31/2017 2330   MONOABS 1.6 (H) 08/31/2017 2330   EOSABS 0.1 08/31/2017 2330   BASOSABS 0.0 08/31/2017 2330    BMET    Component Value Date/Time   NA 143 09/01/2017 0542   K 4.2 09/01/2017 0542   CL 109 09/01/2017 0542   CO2 24 09/01/2017 0542   GLUCOSE 78 09/01/2017 0542   BUN 45 (H) 09/01/2017 0542   CREATININE 4.45 (H) 09/01/2017 0542   CREATININE 3.74 (H) 08/23/2017 1407   CALCIUM 8.3 (L) 09/01/2017 0542   GFRNONAA 10 (L) 09/01/2017 0542   GFRNONAA 12 (L) 08/23/2017 1407   GFRAA 11 (L) 09/01/2017 0542   GFRAA 14 (L) 08/23/2017 1407    COAGS: No results found for: INR, PROTIME   Non-Invasive Vascular Imaging:   BUE Vein mapping  is pending  Statin:  Yes.   Beta Blocker:  Yes.   Aspirin:  No. ACEI:  No. ARB:  Yes.   CCB use:  Yes Other antiplatelets/anticoagulants:  Yes.   Lovenox for DVT prophylaxis   ASSESSMENT/PLAN: This is a 62 y.o. female admitted with acute on chronic diastolic heart failure and acute on chronic insufficiency in need of dialysis access   -pt is right hand dominant.  BUE vein mapping is pending.  Will determine access once this is done.  I have spoken to the vascular lab and they will try to do this today to plan for surgery tomorrow.   Leontine Locket, PA-C [facility] [phone]   I agree with the above.  I have seen and evaluated the patient.  She is in need of permanent access.  I will discuss proceeding with a left arm fistula.  Risks and benefits including the risk of not maturity, the need for future interventions, the risk of steal syndrome were discussed with the patient.  She will be n.p.o. after midnight.  Wells Azarius Lambson  Bilateral Upper Extremity Vein Map  Right Upper Extremity Vein Map  Cephalic  Segment Diameter Depth Comment  1. Axilla 0.50 cm 1.19 cm  2. Mid upper arm 0.58 cm 0.32 cm   3. Above AC 0.55 cm 0.58 cm Branch  4. In AC 0.70 cm 0.30 cm Branch  5. Below AC 0.64 cm 0.76 cm Branch  6. Mid forearm 0.38 cm 0.79 cm   7. Wrist 0.33 cm 0.42 cm    Left Upper Extremity Vein Map  Cephalic  Segment Diameter Depth Comment  1. Axilla 0.50 cm 1.46 cm   2. Mid upper arm 0.53 cm 0.61 cm Branch  3. Above AC 0.61 cm 0.38 cm   4. In AC 0.62 cm 0.69 cm Branch  5. Below AC 0.37 cm 1.10 cm Branch  6. Mid forearm 0.31 cm 0.29 cm   7. Wrist 0.35 cm 0.40 cm

## 2017-09-01 NOTE — Progress Notes (Signed)
PROGRESS NOTE  Kathryn Beck  AQT:[id number] DOB: 11-19-1955 DOA: 08/31/2017 PCP: Fayrene Helper, MD   Brief Narrative: Kathryn Beck is a 62 y.o. female with medical history significant of T2DM, Stage IV CKD, chronic HFpEF, HTN, HLD, OSA not compliant with CPAP, morbid obesity whot presented to the ED for dyspnea and leg swelling for the past 2 weeks following recent admission for pneumonia (got IVF's, discharged 5/31). She was evaluated by her Nephrologist (Dr. Lowanda Foster) on 08/25/17 and her po lasix was increased to 120mg  qAM and 80mg  qPM. She states her weight has remained stable if not slightly decreased since that visit (218lbs this AM), however her LE edema has progressed, making it difficult to ambulate. Evaluation showed creatinine 4, steadily increased.   Assessment & Plan: Principal Problem:   CKD (chronic kidney disease) stage 5, GFR less than 15 ml/min (HCC) Active Problems:   Uncontrolled diabetes mellitus with diabetic nephropathy, with long-term current use of insulin (HCC)   HLD (hyperlipidemia)   Sleep apnea   Anemia of chronic disease   Acute exacerbation of CHF (congestive heart failure) (HCC)   Edema due to hypoalbuminemia  Progressive CKD stage IV-V: Suspected diabetic nephropathy with nephrotic-range proteinuria.  - Plan vein mapping today, access placement tomorrow per [facility]. - Hold NSAIDs, ARB.   Acute on chronic diastolic CHF: DC weight 562BWL, currently 213lbs.  - Continue lasix IV TID per nephrology. Will likely need HD in the near future.  - Appreciate cardiology assistance  T2DM: HbA1c 11% (poor control). Initially insulin held due to hypoglycemia.  - Will cover with moderate SSI for now given erratic po intake and hypoglycemia on arrival. Home doses of levemir and humalog suggest this will not be nearly enough once po intake picks up.  Iron deficiency anemia: Scheduled to get feraheme today as outpatient.  - Given 1u PRBCs, up to 7.8g/dl. Monitor in  AM. - Will give IV feraheme x1, EPO per nephrology  HTN: At goal. - Continue home norvasc, clonidine, labetalol. Consider DC norvasc to r/o side effect of edema.  HLD:  - Continue statin, zetia  DVT prophylaxis: Lovenox Code Status: Full Family Communication: Son at bedside this AM Disposition Plan: Home when completed work up and management.  Consultants:   Nephrology  Cardiology  Procedures:   Echocardiogram 6/12:  - Left ventricle: The cavity size was normal. Systolic function was   normal. The estimated ejection fraction was in the range of 55%   to 60%. Wall motion was normal; there were no regional wall   motion abnormalities. Doppler parameters are consistent with   abnormal left ventricular relaxation (grade 1 diastolic   dysfunction). - Left atrium: The atrium was mildly dilated.  Antimicrobials:  None   Subjective: Swelling nearly the same, fair diuresis. No chest pain or dyspnea. Swelling is "all over."   Objective: Vitals:   09/01/17 0558 09/01/17 0700 09/01/17 0750 09/01/17 1028  BP: 123/68  138/67 136/62  Pulse: 93  91 100  Resp: 16  17   Temp: 98.7 F (37.1 C)  98.7 F (37.1 C)   TempSrc: Oral  Oral   SpO2: 96%  98% 96%  Weight:  96.9 kg (213 lb 10 oz)    Height:        Intake/Output Summary (Last 24 hours) at 09/01/2017 1747 Last data filed at 09/01/2017 1629 Gross per 24 hour  Intake 485.74 ml  Output 2450 ml  Net -1964.26 ml   Filed Weights   08/31/17 2014  09/01/17 0700  Weight: 95.8 kg (211 lb 3.2 oz) 96.9 kg (213 lb 10 oz)    Gen: Obese chronically ill-appearing female in no distress Pulm: Non-labored breathing. Crackles bilaterally at bases CV: Regular rate and rhythm. No murmur, rub, or gallop. No JVD, 3+ LE edema. GI: Abdomen soft, non-tender, non-distended, with normoactive bowel sounds. No organomegaly or masses felt. Ext: Warm, no deformities Skin: No rashes, lesions, ulcers Neuro: Alert and oriented. No focal neurological  deficits. Psych: Judgement and insight appear normal. Mood & affect appropriate.   Data Reviewed: I have personally reviewed following labs and imaging studies  CBC: Recent Labs  Lab 08/31/17 1343 08/31/17 1352 08/31/17 2330 09/01/17 0747  WBC 14.0*  --  13.8*  --   NEUTROABS  --   --  9.5*  --   HGB 7.7* 8.5* 6.8* 7.8*  HCT 26.5* 25.0* 23.0* 25.9*  MCV 74.0*  --  73.2*  --   PLT 403*  --  363  --    Basic Metabolic Panel: Recent Labs  Lab 08/31/17 1343 08/31/17 1352 08/31/17 2009 09/01/17 0542  NA 145 144  --  143  K 3.7 3.7  --  4.2  CL 107 106  --  109  CO2 26  --   --  24  GLUCOSE 202* 208*  --  78  BUN 42* 42*  --  45*  CREATININE 4.32* 4.00*  --  4.45*  CALCIUM 8.8*  --   --  8.3*  PHOS  --   --  4.4  --    GFR: Estimated Creatinine Clearance: 15 mL/min (A) (by C-G formula based on SCr of 4.45 mg/dL (H)). Liver Function Tests: Recent Labs  Lab 08/31/17 1343  AST 16  ALT 18  ALKPHOS 94  BILITOT 0.5  PROT 6.3*  ALBUMIN 2.0*   No results for input(s): LIPASE, AMYLASE in the last 168 hours. No results for input(s): AMMONIA in the last 168 hours. Coagulation Profile: No results for input(s): INR, PROTIME in the last 168 hours. Cardiac Enzymes: No results for input(s): CKTOTAL, CKMB, CKMBINDEX, TROPONINI in the last 168 hours. BNP (last 3 results) No results for input(s): PROBNP in the last 8760 hours. HbA1C: No results for input(s): HGBA1C in the last 72 hours. CBG: Recent Labs  Lab 08/31/17 2250 08/31/17 2316 09/01/17 0915 09/01/17 1138 09/01/17 1706  GLUCAP 58* 78 94 137* 211*   Lipid Profile: No results for input(s): CHOL, HDL, LDLCALC, TRIG, CHOLHDL, LDLDIRECT in the last 72 hours. Thyroid Function Tests: No results for input(s): TSH, T4TOTAL, FREET4, T3FREE, THYROIDAB in the last 72 hours. Anemia Panel: Recent Labs    09/01/17 0542  FERRITIN 288  TIBC 158*  IRON 7*   Urine analysis:    Component Value Date/Time   COLORURINE  YELLOW 08/31/2017 1356   APPEARANCEUR HAZY (A) 08/31/2017 1356   LABSPEC 1.016 08/31/2017 1356   PHURINE 5.0 08/31/2017 1356   GLUCOSEU >=500 (A) 08/31/2017 1356   HGBUR SMALL (A) 08/31/2017 1356   BILIRUBINUR NEGATIVE 08/31/2017 1356   BILIRUBINUR neg 02/15/2017 0913   KETONESUR NEGATIVE 08/31/2017 1356   PROTEINUR >=300 (A) 08/31/2017 1356   UROBILINOGEN 0.2 02/15/2017 0913   NITRITE NEGATIVE 08/31/2017 1356   LEUKOCYTESUR NEGATIVE 08/31/2017 1356   No results found for this or any previous visit (from the past 240 hour(s)).    Radiology Studies: Dg Chest 2 View  Result Date: 08/31/2017 CLINICAL DATA:  Recent diagnosis of pneumonia. Bilateral lower extremity swelling  and pain. EXAM: CHEST - 2 VIEW COMPARISON:  Chest radiograph Aug 18, 2017 FINDINGS: Cardiac silhouette is mildly enlarged. Mediastinal silhouette is not suspicious. Mildly calcified aortic arch. Pulmonary vascular congestion and mild interstitial prominence. LEFT lung base pleural thickening without pleural effusion or focal consolidation. No pneumothorax. Soft tissue planes and included osseous structures are unchanged. IMPRESSION: Mild cardiomegaly. Mild interstitial prominence, possible pulmonary edema. Aortic Atherosclerosis (ICD10-I70.0). Electronically Signed   By: Elon Alas M.D.   On: 08/31/2017 14:12    Scheduled Meds: . aspirin EC  81 mg Oral Daily  . atorvastatin  20 mg Oral Daily  . calcitRIOL  0.25 mcg Oral Q M,W,F  . cloNIDine  0.1 mg Oral Q24H  . enoxaparin (LOVENOX) injection  30 mg Subcutaneous Q24H  . ezetimibe  10 mg Oral Daily  . gabapentin  100 mg Oral TID  . insulin aspart  0-15 Units Subcutaneous TID WC  . insulin aspart  0-5 Units Subcutaneous QHS  . isosorbide mononitrate  30 mg Oral Daily  . labetalol  400 mg Oral BID  . pantoprazole  40 mg Oral Daily  . sodium chloride flush  3 mL Intravenous Q12H   Continuous Infusions: . sodium chloride    . sodium chloride Stopped (08/31/17  1908)  . sodium chloride    . furosemide Stopped (09/01/17 1418)  . [START ON 09/02/2017] vancomycin       LOS: 1 day   Time spent: 25 minutes.  Patrecia Pour, MD [facility] [email or web address] Password [facility] 09/01/2017, 5:47 PM

## 2017-09-01 NOTE — Progress Notes (Signed)
  Echocardiogram 2D Echocardiogram has been performed.  Jennette Dubin 09/01/2017, 8:54 AM

## 2017-09-01 NOTE — H&P (View-Only) (Signed)
Hospital Consult    Reason for Consult:  In need of dialysis access Requesting Physician:  Joelyn Oms MRN #:  [id number]  History of Present Illness: This is a 62 y.o. female who presented to the ED yesterday with increasing shortness of breath and lower extremity swelling.  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  She has a hx of OSA but is not compliant with her CPAP.  The pt is on a statin for cholesterol management. She is on insulin for diabetes.  PTA, she was on a beta blocker, ARB, & CCB for blood pressure control.    She is right hand dominant.   Past Medical History:  Diagnosis Date  . Acid reflux   . Anemia   . Arthritis   . Axillary masses    Soft tissue - status post excision  . Back pain   . Chronic kidney disease   . Diastolic heart failure ([place])   . Essential hypertension   . Ischemic heart disease    Abnormal Myoview April 2018 - medical therapy  . Mixed hyperlipidemia   . Obesity   . Pancreatitis   . Sleep apnea    Noncompliant with CPAP  . Type 2 diabetes mellitus, uncontrolled ([place])     Past Surgical History:  Procedure Laterality Date  . ABDOMINAL HYSTERECTOMY    . COLONOSCOPY  2008   Dr. Oneida Alar: normal   . COLONOSCOPY N/A 12/18/2016   Dr. Oneida Alar: multiple tubular adenomas, internal hemorrhoids. Surveillance in 3 years   . ESOPHAGEAL DILATION N/A 10/13/2015   Procedure: ESOPHAGEAL DILATION;  Surgeon: Rogene Houston, MD;  Location: [facility];  Service: Endoscopy;  Laterality: N/A;  . ESOPHAGOGASTRODUODENOSCOPY N/A 10/13/2015   Dr. Laural Golden: chronic gastritis on path, no H.pylori. Empiric dilation   . ESOPHAGOGASTRODUODENOSCOPY N/A 12/18/2016   Dr. Oneida Alar: mild gastritis. BRAVO study revealed uncontrolled GERD. Dysphagia secondary to uncontrolled reflux  . FOOT SURGERY Bilateral   . LUNG BIOPSY    . MASS EXCISION Right 01/09/2013   Procedure: EXCISION OF NEOPLASM OF RIGHT  AXILLA  AND EXCISION OF NEOPLASM OF LEFT AXILLA;  Surgeon: Jamesetta So,  MD;  Location: [facility];  Service: General;  Laterality: Right;  procedure end @ 08:23  . MYRINGOTOMY WITH TUBE PLACEMENT Bilateral 04/28/2017   Procedure: BILATERAL MYRINGOTOMY WITH TUBE PLACEMENT;  Surgeon: Leta Baptist, MD;  Location: [place];  Service: ENT;  Laterality: Bilateral;  . SAVORY DILATION N/A 12/18/2016   Procedure: SAVORY DILATION;  Surgeon: Danie Binder, MD;  Location: [facility];  Service: Endoscopy;  Laterality: N/A;    Allergies  Allergen Reactions  . Ace Inhibitors Anaphylaxis and Swelling  . Albuterol Swelling  . Penicillins Itching, Swelling and Other (See Comments)    Has patient had a PCN reaction causing immediate rash, facial/tongue/throat swelling, SOB or lightheadedness with hypotension: Yes Has patient had a PCN reaction causing severe rash involving mucus membranes or skin necrosis: No Has patient had a PCN reaction that required hospitalization No Has patient had a PCN reaction occurring within the last 10 years: No If all of the above answers are "NO", then may proceed with Cephalosporin use.   . Statins Other (See Comments)    elevated LFT's    Prior to Admission medications   Medication Sig Start Date End Date Taking? Authorizing Provider  acetaminophen (TYLENOL) 500 MG tablet Take 1,000 mg by mouth every 6 (six) hours as needed for moderate pain or headache.   Yes [provider]  albuterol (PROVENTIL HFA;VENTOLIN HFA) 108 (90 Base) MCG/ACT inhaler Inhale 2 puffs into the lungs every 4 (four) hours as needed for wheezing or shortness of breath. 08/15/17  Yes Noemi Chapel, MD  amLODipine (NORVASC) 10 MG tablet Take 1 tablet (10 mg total) by mouth daily. 05/31/17  Yes Fayrene Helper, MD  atorvastatin (LIPITOR) 20 MG tablet Take 1 tablet (20 mg total) by mouth daily. 05/31/17  Yes Fayrene Helper, MD  azelastine (ASTELIN) 0.1 % nasal spray Place 2 sprays into both nostrils 2 (two) times daily. Use in each nostril as directed Patient taking  differently: Place 2 sprays into both nostrils 2 (two) times daily as needed for rhinitis or allergies. Use in each nostril as directed 05/31/17  Yes Fayrene Helper, MD  calcitRIOL (ROCALTROL) 0.25 MCG capsule Take 0.25 mcg by mouth every Monday, Wednesday, and Friday.  09/15/16  Yes Fayrene Helper, MD  cloNIDine (CATAPRES) 0.1 MG tablet TAKE 1 TABLET BY MOUTH  EVERY EVENING AT 9PM FOR  BLOOD PRESSURE Patient taking differently: Take 0.1 mg by mouth every evening.  05/31/17  Yes Fayrene Helper, MD  dexlansoprazole (DEXILANT) 60 MG capsule 1 PO EVERY MORNING WITH BREAKFAST. Patient taking differently: Take 60 mg by mouth daily. . 04/15/17  Yes Fields, Sandi L, MD  dextromethorphan-guaiFENesin (MUCINEX DM) 30-600 MG 12hr tablet Take 1 tablet by mouth 2 (two) times daily. Patient taking differently: Take 1 tablet by mouth daily.  08/20/17  Yes Barton Dubois, MD  dicyclomine (BENTYL) 10 MG capsule 1 po qachs if needed to prevent diarrhea and abdominal cramps Patient taking differently: Take 10 mg by mouth at bedtime as needed. as needed to prevent diarrhea and abdominal cramps. 12/18/16  Yes Fields, Sandi L, MD  ezetimibe (ZETIA) 10 MG tablet Take 1 tablet (10 mg total) by mouth daily. 05/31/17  Yes Fayrene Helper, MD  furosemide (LASIX) 40 MG tablet One tablet two times daily, as needed, for leg swelling Patient taking differently: Take 40 mg by mouth 2 (two) times daily.  07/11/17  Yes Fayrene Helper, MD  gabapentin (NEURONTIN) 100 MG capsule Take 1 capsule (100 mg total) by mouth 3 (three) times daily. 07/05/17  Yes Edrick Kins, DPM  Insulin Detemir (LEVEMIR) 100 UNIT/ML Pen Inject 100 Units into the skin daily at 10 pm. Patient taking differently: Inject 100 Units into the skin daily.  03/19/17  Yes [name], MD  Insulin Lispro (HUMALOG KWIKPEN) 200 UNIT/ML SOPN Inject 32-36 Units into the skin 3 (three) times daily before meals. Patient taking differently: Inject 32-36  Units into the skin 3 (three) times daily before meals. Per sliding scale 06/18/17  Yes [name], MD  isosorbide mononitrate (IMDUR) 30 MG 24 hr tablet Take 1 tablet (30 mg total) by mouth daily. 05/31/17  Yes Fayrene Helper, MD  labetalol (NORMODYNE) 200 MG tablet Take 2 tablets (400 mg total) by mouth 2 (two) times daily. 05/31/17  Yes Fayrene Helper, MD  methocarbamol (ROBAXIN) 500 MG tablet Take 1 tablet (500 mg total) by mouth every 8 (eight) hours as needed for muscle spasms. 08/20/17  Yes Barton Dubois, MD  olmesartan (BENICAR) 40 MG tablet Take 1 tablet (40 mg total) by mouth daily. Hold until follow up with PCP Patient taking differently: Take 40 mg by mouth every evening.  08/20/17  Yes Barton Dubois, MD  Vitamin D, Ergocalciferol, (DRISDOL) 50000 units CAPS capsule Take 1 capsule (50,000 Units total) by mouth  every 7 (seven) days. Patient taking differently: Take 50,000 Units by mouth every 7 (seven) days. Take every Tuesday 07/08/17  Yes Fayrene Helper, MD  FLUoxetine (PROZAC) 10 MG capsule Take 10 mg by mouth daily.    05/28/11  [provider]  glipiZIDE (GLUCOTROL) 10 MG tablet Take 10 mg by mouth 2 (two) times daily before a meal.    05/28/11  [provider]    Social History   Socioeconomic History  . Marital status: Married    Spouse name: Not on file  . Number of children: Not on file  . Years of education: Not on file  . Highest education level: Not on file  Occupational History  . Not on file  Social Needs  . Financial resource strain: Not on file  . Food insecurity:    Worry: Not on file    Inability: Not on file  . Transportation needs:    Medical: Not on file    Non-medical: Not on file  Tobacco Use  . Smoking status: Never Smoker  . Smokeless tobacco: Never Used  Substance and Sexual Activity  . Alcohol use: No  . Drug use: No  . Sexual activity: Not Currently  Lifestyle  . Physical activity:    Days per week: Not on  file    Minutes per session: Not on file  . Stress: Not on file  Relationships  . Social connections:    Talks on phone: Not on file    Gets together: Not on file    Attends religious service: Not on file    Active member of club or organization: Not on file    Attends meetings of clubs or organizations: Not on file    Relationship status: Not on file  . Intimate partner violence:    Fear of current or ex partner: Not on file    Emotionally abused: Not on file    Physically abused: Not on file    Forced sexual activity: Not on file  Other Topics Concern  . Not on file  Social History Narrative  . Not on file     Family History  Problem Relation Age of Onset  . Hypertension Father   . Hypercholesterolemia Father   . Arthritis Father   . Hypertension Sister   . Hypercholesterolemia Sister   . Breast cancer Sister   . Hypertension Sister   . Colon cancer Neg Hx   . Colon polyps Neg Hx     ROS: [x]  Positive   [ ]  Negative   [ ]  All sytems reviewed and are negative  Cardiac: []  chest pain/pressure []  palpitations [x]  SOB  [x]  DOE [x]  CAD  Vascular: []  pain in legs while walking []  pain in legs at rest []  pain in legs at night []  non-healing ulcers []  hx of DVT [x]  swelling in legs  Pulmonary: []  productive cough []  asthma/wheezing []  home O2 [x]  OSA  Neurologic: []  weakness in []  arms []  legs []  numbness in []  arms []  legs []  hx of CVA []  mini stroke [] difficulty speaking or slurred speech []  temporary loss of vision in one eye []  dizziness  Hematologic: []  hx of cancer []  bleeding problems []  problems with blood clotting easily  Endocrine:   [x]  diabetes []  thyroid disease  GI []  vomiting blood []  blood in stool [x]  acid reflux  GU: [x]  CKD/renal failure []  HD--[]  M/W/F or []  T/T/S []  burning with urination []  blood in urine  Psychiatric: []   anxiety []  depression  Musculoskeletal: [x]  arthritis []  joint pain  Integumentary: []   rashes []  ulcers  Constitutional: []  fever []  chills   Physical Examination  Vitals:   09/01/17 0750 09/01/17 1028  BP: 138/67 136/62  Pulse: 91 100  Resp: 17   Temp: 98.7 F (37.1 C)   SpO2: 98% 96%   Body mass index is 36.67 kg/m.  General:  WDWN in NAD Gait: Not observed HENT: WNL, normocephalic Pulmonary: normal non-labored breathing, without Rales, rhonchi,  wheezing Cardiac: regula Abdomen:  obese Skin: without rashes Vascular Exam/Pulses:  Right Left  Radial 2+ (normal) 2+ (normal)  Ulnar 2+ (normal) 2+ (normal)  DP 2+ (normal) 2+ (normal)  PT Unable to palpate  Unable to palpate    Extremities: without ischemic changes, without Gangrene , without cellulitis; without open wounds; BLE edema; Iv in left forearm Musculoskeletal: no muscle wasting or atrophy  Neurologic: A&O X 3;  No focal weakness or paresthesias are detected; speech is fluent/normal Psychiatric:  The pt has Normal affect.  CBC    Component Value Date/Time   WBC 13.8 (H) 08/31/2017 2330   RBC 3.14 (L) 08/31/2017 2330   HGB 7.8 (L) 09/01/2017 0747   HCT 25.9 (L) 09/01/2017 0747   PLT 363 08/31/2017 2330   MCV 73.2 (L) 08/31/2017 2330   MCH 21.7 (L) 08/31/2017 2330   MCHC 29.6 (L) 08/31/2017 2330   RDW 15.4 08/31/2017 2330   LYMPHSABS 2.6 08/31/2017 2330   MONOABS 1.6 (H) 08/31/2017 2330   EOSABS 0.1 08/31/2017 2330   BASOSABS 0.0 08/31/2017 2330    BMET    Component Value Date/Time   NA 143 09/01/2017 0542   K 4.2 09/01/2017 0542   CL 109 09/01/2017 0542   CO2 24 09/01/2017 0542   GLUCOSE 78 09/01/2017 0542   BUN 45 (H) 09/01/2017 0542   CREATININE 4.45 (H) 09/01/2017 0542   CREATININE 3.74 (H) 08/23/2017 1407   CALCIUM 8.3 (L) 09/01/2017 0542   GFRNONAA 10 (L) 09/01/2017 0542   GFRNONAA 12 (L) 08/23/2017 1407   GFRAA 11 (L) 09/01/2017 0542   GFRAA 14 (L) 08/23/2017 1407    COAGS: No results found for: INR, PROTIME   Non-Invasive Vascular Imaging:   BUE Vein mapping  is pending  Statin:  Yes.   Beta Blocker:  Yes.   Aspirin:  No. ACEI:  No. ARB:  Yes.   CCB use:  Yes Other antiplatelets/anticoagulants:  Yes.   Lovenox for DVT prophylaxis   ASSESSMENT/PLAN: This is a 62 y.o. female admitted with acute on chronic diastolic heart failure and acute on chronic insufficiency in need of dialysis access   -pt is right hand dominant.  BUE vein mapping is pending.  Will determine access once this is done.  I have spoken to the vascular lab and they will try to do this today to plan for surgery tomorrow.   Leontine Locket, PA-C [facility] [phone]   I agree with the above.  I have seen and evaluated the patient.  She is in need of permanent access.  I will discuss proceeding with a left arm fistula.  Risks and benefits including the risk of not maturity, the need for future interventions, the risk of steal syndrome were discussed with the patient.  She will be n.p.o. after midnight.  Wells Braya Habermehl  Bilateral Upper Extremity Vein Map  Right Upper Extremity Vein Map  Cephalic  Segment Diameter Depth Comment  1. Axilla 0.50 cm 1.19 cm  2. Mid upper arm 0.58 cm 0.32 cm   3. Above AC 0.55 cm 0.58 cm Branch  4. In AC 0.70 cm 0.30 cm Branch  5. Below AC 0.64 cm 0.76 cm Branch  6. Mid forearm 0.38 cm 0.79 cm   7. Wrist 0.33 cm 0.42 cm    Left Upper Extremity Vein Map  Cephalic  Segment Diameter Depth Comment  1. Axilla 0.50 cm 1.46 cm   2. Mid upper arm 0.53 cm 0.61 cm Branch  3. Above AC 0.61 cm 0.38 cm   4. In AC 0.62 cm 0.69 cm Branch  5. Below AC 0.37 cm 1.10 cm Branch  6. Mid forearm 0.31 cm 0.29 cm   7. Wrist 0.35 cm 0.40 cm

## 2017-09-01 NOTE — Progress Notes (Signed)
Pt refused to wear CPAP for the night. RT will continue to monitor as needed.

## 2017-09-01 NOTE — Progress Notes (Signed)
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  Subjective:   Decent initial diuresis thus far  Son in room updated  Discussed possibility of needing to start dialysis prior to discharge or soon thereafter; discussed modalities; her father was on hemodialysis; she has a strong interest in peritoneal dialysis.  Discussed rationale for back-up AV fistula, she understands and agrees to pursue  Feels that swelling has improved.  Met with dietitian, discussed dietary sodium  SCr up to 4.45  Rx Fereheme today already  06/11 0701 - 06/12 0700 In: 485.7 [I.V.:14.7; Blood:345; IV Piggyback:126.1] Out: -   Filed Weights   08/31/17 2014 09/01/17 0700  Weight: 95.8 kg (211 lb 3.2 oz) 96.9 kg (213 lb 10 oz)    Scheduled Meds: . aspirin EC  81 mg Oral Daily  . atorvastatin  20 mg Oral Daily  . calcitRIOL  0.25 mcg Oral Q M,W,F  . cloNIDine  0.1 mg Oral Q24H  . enoxaparin (LOVENOX) injection  30 mg Subcutaneous Q24H  . ezetimibe  10 mg Oral Daily  . gabapentin  100 mg Oral TID  . insulin aspart  0-15 Units Subcutaneous TID WC  . insulin aspart  0-5 Units Subcutaneous QHS  . isosorbide mononitrate  30 mg Oral Daily  . labetalol  400 mg Oral BID  . pantoprazole  40 mg Oral Daily  . sodium chloride flush  3 mL Intravenous Q12H   Continuous Infusions: . sodium chloride    . sodium chloride Stopped (08/31/17 1908)  . sodium chloride    . furosemide 120 mg (09/01/17 1318)   PRN Meds:.sodium chloride, acetaminophen, albuterol, calcium carbonate (dosed in mg elemental calcium), camphor-menthol **AND** hydrOXYzine, docusate sodium, feeding supplement (NEPRO CARB STEADY), methocarbamol, ondansetron (ZOFRAN) IV, sodium chloride flush, sorbitol, zolpidem  Current Labs: reviewed  Results for MODUPE, SHAMPINE (MRN [id number]) as of 09/01/2017 14:46  Ref. Range 09/01/2017 05:42  Saturation Ratios Latest Ref Range: 10.4  - 31.8 % 4 (L)  Ferritin Latest Ref Range: 11 - 307 ng/mL 288   Physical Exam:  Blood pressure 136/62, pulse 100, temperature 98.7 F (37.1 C), temperature source Oral, resp. rate 17, height 5' 4" (1.626 m), weight 96.9 kg (213 lb 10 oz), SpO2 96 %. GEN: chronically ill appearing, NAD ENT: Poor dentition NAD EYES: EOMI CV: RRR no rub, nl s1s2 PULM: diminished in bases with crackles, clear in upper lobes ABD: s/nt/nd SKIN: no rashes/lesions EXT:3+ LEE, pitting into knees -- improved this AM  A 1. 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 Hypervoelmia / LEE / SOB / Pulm Edema likely primarily from #1 3. Anemia, microcytic, was told had IDA - s/p 532m IV Fereheme 09/01/17 4. HTN on CCB and clonidine daily 5. Recent CAP of LLL s/p levofloxacin 6. ? 2HPTH, on C3; PTH pending, P WNL 7. GERD  P 1. No immediate dialysis needs, patient with strong preference for peritoneal dialysis, will need close follow-up with outpatient nephrology to make this happen 2. I think she should have fistula placed prior to discharge to be used either if PD is not an option or as back-up; VVS consulted; vein mapping ordered 3. Continue diuretics at current dosing 4. Daily weights, Daily Renal Panel, Strict I/Os, Avoid nephrotoxins (NSAIDs, judicious IV Contrast)  Medication Issues;  Preferred narcotic agents for pain control are hydromorphone, fentanyl, and methadone. Morphine should not be used. Oxydone is not preferred.   Baclofen should  not be used.  Avoid Fleets and Magnesium Citrate Bowel Preps    Pearson Grippe MD 09/01/2017, 2:44 PM  Recent Labs  Lab 08/31/17 1343 08/31/17 1352 08/31/17 2009 09/01/17 0542  NA 145 144  --  143  K 3.7 3.7  --  4.2  CL 107 106  --  109  CO2 26  --   --  24  GLUCOSE 202* 208*  --  78  BUN 42* 42*  --  45*  CREATININE 4.32* 4.00*  --  4.45*  CALCIUM 8.8*  --   --  8.3*  PHOS  --   --  4.4  --    Recent Labs  Lab 08/31/17 1343  08/31/17 1352 08/31/17 2330 09/01/17 0747  WBC 14.0*  --  13.8*  --   NEUTROABS  --   --  9.5*  --   HGB 7.7* 8.5* 6.8* 7.8*  HCT 26.5* 25.0* 23.0* 25.9*  MCV 74.0*  --  73.2*  --   PLT 403*  --  363  --

## 2017-09-01 NOTE — Progress Notes (Signed)
Patient is in the hospital at this time.  Will need to reschedule Feraheme.

## 2017-09-02 ENCOUNTER — Inpatient Hospital Stay (HOSPITAL_COMMUNITY): Payer: 59 | Admitting: Certified Registered"

## 2017-09-02 ENCOUNTER — Telehealth: Payer: Self-pay | Admitting: Surgery

## 2017-09-02 ENCOUNTER — Encounter (HOSPITAL_COMMUNITY)
Admission: EM | Disposition: A | Discharge status: Skilled Nursing Facility | Payer: Self-pay | Source: Home / Self Care | Attending: Family Medicine

## 2017-09-02 ENCOUNTER — Encounter (HOSPITAL_COMMUNITY): Payer: Self-pay | Admitting: *Deleted

## 2017-09-02 HISTORY — PX: AV FISTULA PLACEMENT: SHX1204

## 2017-09-02 LAB — CBC
HCT: 24.5 % — ABNORMAL LOW (ref 36.0–46.0)
Hemoglobin: 7.3 g/dL — ABNORMAL LOW (ref 12.0–15.0)
MCH: 22.6 pg — ABNORMAL LOW (ref 26.0–34.0)
MCHC: 29.8 g/dL — AB (ref 30.0–36.0)
MCV: 75.9 fL — ABNORMAL LOW (ref 78.0–100.0)
Platelets: 350 10*3/uL (ref 150–400)
RBC: 3.23 MIL/uL — ABNORMAL LOW (ref 3.87–5.11)
RDW: 16.2 % — AB (ref 11.5–15.5)
WBC: 11.3 10*3/uL — ABNORMAL HIGH (ref 4.0–10.5)

## 2017-09-02 LAB — TYPE AND SCREEN
ABO/RH(D): B POS
Antibody Screen: NEGATIVE
Unit division: 0

## 2017-09-02 LAB — GLUCOSE, CAPILLARY
GLUCOSE-CAPILLARY: 315 mg/dL — AB (ref 65–99)
GLUCOSE-CAPILLARY: 232 mg/dL — AB (ref 65–99)
GLUCOSE-CAPILLARY: 234 mg/dL — AB (ref 65–99)
GLUCOSE-CAPILLARY: 274 mg/dL — AB (ref 65–99)
GLUCOSE-CAPILLARY: 301 mg/dL — AB (ref 65–99)
Glucose-Capillary: 312 mg/dL — ABNORMAL HIGH (ref 65–99)
Glucose-Capillary: 283 mg/dL — ABNORMAL HIGH (ref 65–99)
Glucose-Capillary: 258 mg/dL — ABNORMAL HIGH (ref 65–99)

## 2017-09-02 LAB — BASIC METABOLIC PANEL
Anion gap: 14 (ref 5–15)
BUN: 50 mg/dL — ABNORMAL HIGH (ref 6–20)
CALCIUM: 8.6 mg/dL — AB (ref 8.9–10.3)
CO2: 27 mmol/L (ref 22–32)
CREATININE: 4.61 mg/dL — AB (ref 0.44–1.00)
Chloride: 102 mmol/L (ref 101–111)
GFR, EST AFRICAN AMERICAN: 11 mL/min — AB (ref >60)
GFR, EST NON AFRICAN AMERICAN: 9 mL/min — AB (ref >60)
Glucose, Bld: 315 mg/dL — ABNORMAL HIGH (ref 65–99)
Potassium: 4.2 mmol/L (ref 3.5–5.1)
SODIUM: 143 mmol/L (ref 135–145)

## 2017-09-02 LAB — BPAM RBC
Blood Product Expiration Date: 201907062359
ISSUE DATE / TIME: 201906120237
Unit Type and Rh: 7300

## 2017-09-02 LAB — PARATHYROID HORMONE, INTACT (NO CA): PTH: 128 pg/mL — AB (ref 15–65)

## 2017-09-02 LAB — SURGICAL PCR SCREEN
MRSA, PCR: NEGATIVE
Staphylococcus aureus: NEGATIVE

## 2017-09-02 LAB — PROTIME-INR
INR: 1.17
PROTHROMBIN TIME: 14.8 s (ref 11.4–15.2)

## 2017-09-02 SURGERY — ARTERIOVENOUS (AV) FISTULA CREATION
Anesthesia: Monitor Anesthesia Care | Site: Arm Upper | Laterality: Left

## 2017-09-02 MED ORDER — ONDANSETRON HCL 4 MG/2ML IJ SOLN
INTRAMUSCULAR | Status: DC | PRN
  Administered 2017-09-02: 4 mg via INTRAVENOUS

## 2017-09-02 MED ORDER — SODIUM CHLORIDE 0.9 % IV SOLN
INTRAVENOUS | Status: DC | PRN
  Administered 2017-09-02: 500 mL

## 2017-09-02 MED ORDER — PROPOFOL 500 MG/50ML IV EMUL
INTRAVENOUS | Status: DC | PRN
  Administered 2017-09-02: 75 ug/kg/min via INTRAVENOUS

## 2017-09-02 MED ORDER — LIDOCAINE-EPINEPHRINE (PF) 1 %-1:200000 IJ SOLN
INTRAMUSCULAR | Status: DC | PRN
  Administered 2017-09-02: 14 mL

## 2017-09-02 MED ORDER — SODIUM CHLORIDE 0.9 % IV SOLN
INTRAVENOUS | Status: AC
  Filled 2017-09-02: qty 1.2

## 2017-09-02 MED ORDER — LIDOCAINE-EPINEPHRINE (PF) 1 %-1:200000 IJ SOLN
INTRAMUSCULAR | Status: AC
  Filled 2017-09-02: qty 30

## 2017-09-02 MED ORDER — HEMOSTATIC AGENTS (NO CHARGE) OPTIME
TOPICAL | Status: DC | PRN
  Administered 2017-09-02: 1 via TOPICAL

## 2017-09-02 MED ORDER — PROPOFOL 10 MG/ML IV BOLUS
INTRAVENOUS | Status: AC
  Filled 2017-09-02: qty 20

## 2017-09-02 MED ORDER — PROPOFOL 10 MG/ML IV BOLUS
INTRAVENOUS | Status: DC | PRN
  Administered 2017-09-02: 20 mg via INTRAVENOUS

## 2017-09-02 MED ORDER — INSULIN DETEMIR 100 UNIT/ML ~~LOC~~ SOLN
15.0000 [IU] | Freq: Every day | SUBCUTANEOUS | Status: DC
  Filled 2017-09-02: qty 0.15

## 2017-09-02 MED ORDER — MIDAZOLAM HCL 2 MG/2ML IJ SOLN
INTRAMUSCULAR | Status: AC
  Filled 2017-09-02: qty 2

## 2017-09-02 MED ORDER — INSULIN ASPART 100 UNIT/ML ~~LOC~~ SOLN
SUBCUTANEOUS | Status: AC
  Filled 2017-09-02: qty 1

## 2017-09-02 MED ORDER — 0.9 % SODIUM CHLORIDE (POUR BTL) OPTIME
TOPICAL | Status: DC | PRN
  Administered 2017-09-02: 1000 mL

## 2017-09-02 MED ORDER — INSULIN DETEMIR 100 UNIT/ML ~~LOC~~ SOLN
15.0000 [IU] | Freq: Every day | SUBCUTANEOUS | Status: DC
  Administered 2017-09-02: 15 [IU] via SUBCUTANEOUS
  Filled 2017-09-02 (×3): qty 0.15

## 2017-09-02 MED ORDER — MIDAZOLAM HCL 5 MG/5ML IJ SOLN
INTRAMUSCULAR | Status: DC | PRN
  Administered 2017-09-02: 2 mg via INTRAVENOUS

## 2017-09-02 MED ORDER — FENTANYL CITRATE (PF) 250 MCG/5ML IJ SOLN
INTRAMUSCULAR | Status: AC
  Filled 2017-09-02: qty 5

## 2017-09-02 MED ORDER — FENTANYL CITRATE (PF) 250 MCG/5ML IJ SOLN
INTRAMUSCULAR | Status: DC | PRN
  Administered 2017-09-02: 50 ug via INTRAVENOUS

## 2017-09-02 MED ORDER — ONDANSETRON HCL 4 MG/2ML IJ SOLN
INTRAMUSCULAR | Status: AC
  Filled 2017-09-02: qty 2

## 2017-09-02 MED ORDER — SODIUM CHLORIDE 0.9 % IV SOLN
INTRAVENOUS | Status: DC
  Administered 2017-09-02: 10:00:00 via INTRAVENOUS

## 2017-09-02 MED ORDER — INSULIN ASPART 100 UNIT/ML ~~LOC~~ SOLN
10.0000 [IU] | Freq: Once | SUBCUTANEOUS | Status: AC
  Administered 2017-09-02: 10 [IU] via SUBCUTANEOUS

## 2017-09-02 MED ORDER — INSULIN ASPART 100 UNIT/ML ~~LOC~~ SOLN
10.0000 [IU] | Freq: Once | SUBCUTANEOUS | Status: AC
  Administered 2017-09-02: 10 [IU] via SUBCUTANEOUS
  Filled 2017-09-02: qty 0.1

## 2017-09-02 SURGICAL SUPPLY — 40 items
ARMBAND PINK RESTRICT EXTREMIT (MISCELLANEOUS) ×4 IMPLANT
CANISTER SUCT 3000ML PPV (MISCELLANEOUS) ×2 IMPLANT
CLIP VESOCCLUDE MED 6/CT (CLIP) ×2 IMPLANT
CLIP VESOCCLUDE SM WIDE 6/CT (CLIP) ×2 IMPLANT
COVER PROBE W GEL 5X96 (DRAPES) ×2 IMPLANT
DERMABOND ADHESIVE PROPEN (GAUZE/BANDAGES/DRESSINGS) ×1
DERMABOND ADVANCED (GAUZE/BANDAGES/DRESSINGS) ×1
DERMABOND ADVANCED .7 DNX12 (GAUZE/BANDAGES/DRESSINGS) ×1 IMPLANT
DERMABOND ADVANCED .7 DNX6 (GAUZE/BANDAGES/DRESSINGS) IMPLANT
ELECT REM PT RETURN 9FT ADLT (ELECTROSURGICAL) ×2
ELECTRODE REM PT RTRN 9FT ADLT (ELECTROSURGICAL) ×1 IMPLANT
GLOVE BIO SURGEON STRL SZ 6.5 (GLOVE) ×1 IMPLANT
GLOVE BIOGEL PI IND STRL 6.5 (GLOVE) IMPLANT
GLOVE BIOGEL PI IND STRL 7.0 (GLOVE) IMPLANT
GLOVE BIOGEL PI IND STRL 7.5 (GLOVE) ×1 IMPLANT
GLOVE BIOGEL PI INDICATOR 6.5 (GLOVE) ×1
GLOVE BIOGEL PI INDICATOR 7.0 (GLOVE) ×2
GLOVE BIOGEL PI INDICATOR 7.5 (GLOVE) ×1
GLOVE ECLIPSE 7.0 STRL STRAW (GLOVE) ×1 IMPLANT
GLOVE SURG SS PI 6.5 STRL IVOR (GLOVE) ×1 IMPLANT
GLOVE SURG SS PI 7.5 STRL IVOR (GLOVE) ×2 IMPLANT
GOWN STRL REUS W/ TWL LRG LVL3 (GOWN DISPOSABLE) ×2 IMPLANT
GOWN STRL REUS W/ TWL XL LVL3 (GOWN DISPOSABLE) ×1 IMPLANT
GOWN STRL REUS W/TWL LRG LVL3 (GOWN DISPOSABLE) ×2
GOWN STRL REUS W/TWL XL LVL3 (GOWN DISPOSABLE) ×1
HEMOSTAT SNOW SURGICEL 2X4 (HEMOSTASIS) ×1 IMPLANT
KIT BASIN OR (CUSTOM PROCEDURE TRAY) ×2 IMPLANT
KIT TURNOVER KIT B (KITS) ×2 IMPLANT
NS IRRIG 1000ML POUR BTL (IV SOLUTION) ×2 IMPLANT
PACK CV ACCESS (CUSTOM PROCEDURE TRAY) ×2 IMPLANT
PAD ARMBOARD 7.5X6 YLW CONV (MISCELLANEOUS) ×4 IMPLANT
SUT PROLENE 6 0 BV (SUTURE) ×2 IMPLANT
SUT PROLENE 6 0 CC (SUTURE) ×3 IMPLANT
SUT PROLENE 7 0 BV 1 (SUTURE) ×1 IMPLANT
SUT VIC AB 3-0 SH 27 (SUTURE) ×2
SUT VIC AB 3-0 SH 27X BRD (SUTURE) ×1 IMPLANT
SUT VICRYL 4-0 PS2 18IN ABS (SUTURE) ×3 IMPLANT
TOWEL GREEN STERILE (TOWEL DISPOSABLE) ×2 IMPLANT
UNDERPAD 30X30 (UNDERPADS AND DIAPERS) ×2 IMPLANT
WATER STERILE IRR 1000ML POUR (IV SOLUTION) ×2 IMPLANT

## 2017-09-02 NOTE — Telephone Encounter (Signed)
sch appt spk to daughter 10/11/17 8am Dialysis Duplex p/o MD s/p Failed L RC AVF / L BC AVF

## 2017-09-02 NOTE — Op Note (Signed)
Patient name: Kathryn Beck MRN: [id number] DOB: 1955/07/04 Sex: female  09/02/2017 Pre-operative Diagnosis: CKD 5 Post-operative diagnosis:  Same Surgeon:  Annamarie Major Assistants:  Laurence Slate Procedure:   #1: Failed left radiocephalic fistula   #2: Left brachiocephalic fistula Anesthesia: MAC Blood Loss: Minimal Specimens: None  Findings: The cephalic vein at the wrist looked adequate on ultrasound however once it was exposed it had gone into spasm and was very small.  I did perform a radiocephalic fistula however the vein did not distend following fistula creation, and therefore I elected to place a left brachiocephalic fistula.  The cephalic vein at this level was of excellent caliber measuring greater than 4 mm.  Indications: We have been asked to provide dialysis access.  Preoperative vein mapping revealed an adequate left cephalic vein.  I discussed the risks and benefits of the operation with the patient and family prior to the procedure.  They were willing to proceed.  Procedure:  The patient was identified in the holding area and taken to [facility]  The patient was then placed supine on the table. MAC anesthesia was administered.  The patient was prepped and draped in the usual sterile fashion.  A time out was called and antibiotics were administered.  Ultrasound was used to evaluate the left cephalic vein in the upper arm.  It appeared to be adequate down to the level of the wrist and therefore I elected to proceed with a left radiocephalic fistula.  A longitudinal incision was made just proximal to the wrist after anesthetizing the skin with 1% lidocaine.  I dissected out the cephalic vein.  It was much smaller when I had exposed and what it appeared on ultrasound.  I suspected that it had gone into spasm.  I then dissected the radial artery which was a 2.5 mm disease-free artery.  I elected to attempt a radiocephalic fistula.  I marked the vein for orientation and ligated it  distally.  It was instilled with heparin saline but did not distend as I had expected.  I occluded the radial artery with vascular clamps and used a #11 blade to make an arteriotomy which was extended longitudinally with Potts scissors.  The vein was then spatulated and a running end-to-side anastomosis was created with 7-0 Prolene.  Once this was completed the clamps were released.  The vein did not distend and I did not feel that this was going to be a usable fistula.  I therefore evaluated the left cephalic vein at the antecubital crease.  It was adequate at this level measuring about 4 mm.  I made a transverse incision at the antecubital crease after anesthetizing the skin with 1% lidocaine.  I dissected out the cephalic vein at this level.  It was a healthy vein at approximately 4 mm in diameter.  It was marked for orientation.  I then dissected out the brachial artery.  This was a 3.5 mm artery which was disease-free.  I then ligated the vein distally and instilled heparinized saline into the cephalic vein which distended nicely.  The brachial artery was then occluded with vascular clamps and a #11 blade was used to make an arteriotomy which was extended longitudinally with Potts scissors.  The vein was cut the appropriate length and spatulated to fit the size of the arteriotomy.  A running anastomosis was created with 6-0 Prolene.  Prior to completion the appropriate flushing maneuvers were performed and the anastomosis was completed.  The vein  distended nicely.  I inspected the course of the vein to make sure there were no kinks.  I divided a large cephalic vein branch.  The patient had a palpable radial pulse.  There was excellent Doppler flow in the radial and ulnar artery as well as the cephalic vein.  Hemostasis was then achieved.  The 2 incisions were then closed with a layer of 3 oh followed by 4-0 Vicryl and Dermabond.  There were no immediate complications.   Disposition: To PACU stable   V.  Annamarie Major, M.D. [facility] Office: [phone] Pager:  [phone]

## 2017-09-02 NOTE — Progress Notes (Signed)
Called report to Short Stay.

## 2017-09-02 NOTE — Progress Notes (Signed)
PROGRESS NOTE  [name]  EPP:[id number] DOB: [birth year] DOA: 08/31/2017 PCP: Fayrene Helper, MD   Brief Narrative: Kathryn Beck is a 62 y.o. female with medical history significant of T2DM, Stage IV CKD, chronic HFpEF, HTN, HLD, OSA not compliant with CPAP, morbid obesity whot presented to the ED for dyspnea and leg swelling for the past 2 weeks following recent admission for pneumonia (got IVF's, discharged 5/31). She was evaluated by her Nephrologist (Dr. Lowanda Foster) on 08/25/17 and her po lasix was increased to 120mg  qAM and 80mg  qPM. She states her weight has remained stable if not slightly decreased since that visit (218lbs this AM), however her LE edema has progressed, making it difficult to ambulate. Evaluation showed creatinine 4, steadily increased.   Assessment & Plan: Principal Problem:   CKD (chronic kidney disease) stage 5, GFR less than 15 ml/min (HCC) Active Problems:   Uncontrolled diabetes mellitus with diabetic nephropathy, with long-term current use of insulin (HCC)   HLD (hyperlipidemia)   Sleep apnea   Anemia of chronic disease   Acute exacerbation of CHF (congestive heart failure) (HCC)   Edema due to hypoalbuminemia  Progressive CKD stage V: Suspected diabetic nephropathy with nephrotic-range proteinuria.  - Left brachiocephalic fistula placed 08/27/3014 by Dr. Trula Slade for back up, as pt wishes to have peritoneal dialysis primarily. Will follow up with Dr. Joelyn Oms as outpatient. Having good UOP in response to lasix with only mild creatinine creep upward. No urgent HD needs.  - Hold NSAIDs, ARB.   Acute on chronic diastolic CHF: DC weight [id number]. Has improved 213lbs > 208lbs during this hospitalization. Echo 6/12 w/EF 55-60%, G1DD, no WMA's. - Continue lasix 120mg  IV q8h per nephrology. - Appreciate cardiology assistance  T2DM: HbA1c 11% (poor control). Initially insulin held due to hypoglycemia.  - Will cover with moderate SSI for now given erratic po  intake and hypoglycemia on arrival. Home doses of levemir and humalog suggest this will not be nearly enough once po intake picks up. Started levemir 15u (renal weight-based dosing) this AM and will titrate as needed.  Iron deficiency anemia: Scheduled to get feraheme today as outpatient.  - Given 1u PRBCs, up to 7.8g/dl. Monitor in AM, may need additional unit. - Given feraheme x1, EPO per nephrology  HTN: At goal. - Continue clonidine, labetalol.   HLD:  - Continue statin, zetia  DVT prophylaxis: Lovenox Code Status: Full Family Communication: Daughter at bedside this AM Disposition Plan: Home when completed work up and management.  Consultants:   Nephrology  Cardiology  Procedures:   Echocardiogram 6/12:  - Left ventricle: The cavity size was normal. Systolic function was   normal. The estimated ejection fraction was in the range of 55%   to 60%. Wall motion was normal; there were no regional wall   motion abnormalities. Doppler parameters are consistent with   abnormal left ventricular relaxation (grade 1 diastolic   dysfunction). - Left atrium: The atrium was mildly dilated.  09/02/2017 Dr. Trula Slade:  #1: Failed left radiocephalic fistula #2: Left brachiocephalic fistula  Antimicrobials:  None   Subjective: Leg swelling improved, no trouble breathing. Making good amounts of urine. Feels like she has no energy.   Objective: BP 116/61 (BP Location: Right Arm)   Pulse 74   Temp 97.8 F (36.6 C) (Oral)   Resp 14   Ht 5\' 4"  (1.626 m)   Wt 94.3 kg (208 lb)   SpO2 97%   BMI 35.70 kg/m    Gen: Obese  chronically ill-appearing female in no distress Pulm: Non-labored breathing. Crackles bilaterally at bases CV: Regular rate and rhythm. No murmur, rub, or gallop. No JVD, 1+ LE edema. GI: Abdomen soft, non-tender, non-distended, with normoactive bowel sounds. No organomegaly or masses felt. Ext: Warm, no deformities Skin: No rashes, lesions, ulcers Neuro: Alert  and oriented. No focal neurological deficits. Psych: Judgement and insight appear normal. Mood & affect appropriate.   Data Reviewed: I have personally reviewed following labs and imaging studies  CBC: Recent Labs  Lab 08/31/17 1343 08/31/17 1352 08/31/17 2330 09/01/17 0747 09/02/17 0628  WBC 14.0*  --  13.8*  --  11.3*  NEUTROABS  --   --  9.5*  --   --   HGB 7.7* 8.5* 6.8* 7.8* 7.3*  HCT 26.5* 25.0* 23.0* 25.9* 24.5*  MCV 74.0*  --  73.2*  --  75.9*  PLT 403*  --  363  --  270   Basic Metabolic Panel: Recent Labs  Lab 08/31/17 1343 08/31/17 1352 08/31/17 2009 09/01/17 0542 09/02/17 0628  NA 145 144  --  143 143  K 3.7 3.7  --  4.2 4.2  CL 107 106  --  109 102  CO2 26  --   --  24 27  GLUCOSE 202* 208*  --  78 315*  BUN 42* 42*  --  45* 50*  CREATININE 4.32* 4.00*  --  4.45* 4.61*  CALCIUM 8.8*  --   --  8.3* 8.6*  PHOS  --   --  4.4  --   --    GFR: Estimated Creatinine Clearance: 14.3 mL/min (A) (by C-G formula based on SCr of 4.61 mg/dL (H)). Liver Function Tests: Recent Labs  Lab 08/31/17 1343  AST 16  ALT 18  ALKPHOS 94  BILITOT 0.5  PROT 6.3*  ALBUMIN 2.0*   No results for input(s): LIPASE, AMYLASE in the last 168 hours. No results for input(s): AMMONIA in the last 168 hours. Coagulation Profile: Recent Labs  Lab 09/02/17 0628  INR 1.17   Cardiac Enzymes: No results for input(s): CKTOTAL, CKMB, CKMBINDEX, TROPONINI in the last 168 hours. BNP (last 3 results) No results for input(s): PROBNP in the last 8760 hours. HbA1C: No results for input(s): HGBA1C in the last 72 hours. CBG: Recent Labs  Lab 09/02/17 0927 09/02/17 1017 09/02/17 1226 09/02/17 1330 09/02/17 1503  GLUCAP 315* 283* 258* 232* 234*   Lipid Profile: No results for input(s): CHOL, HDL, LDLCALC, TRIG, CHOLHDL, LDLDIRECT in the last 72 hours. Thyroid Function Tests: No results for input(s): TSH, T4TOTAL, FREET4, T3FREE, THYROIDAB in the last 72 hours. Anemia  Panel: Recent Labs    09/01/17 0542  FERRITIN 288  TIBC 158*  IRON 7*   Urine analysis:    Component Value Date/Time   COLORURINE YELLOW 08/31/2017 1356   APPEARANCEUR HAZY (A) 08/31/2017 1356   LABSPEC 1.016 08/31/2017 1356   PHURINE 5.0 08/31/2017 1356   GLUCOSEU >=500 (A) 08/31/2017 1356   HGBUR SMALL (A) 08/31/2017 1356   BILIRUBINUR NEGATIVE 08/31/2017 1356   BILIRUBINUR neg 02/15/2017 0913   KETONESUR NEGATIVE 08/31/2017 1356   PROTEINUR >=300 (A) 08/31/2017 1356   UROBILINOGEN 0.2 02/15/2017 0913   NITRITE NEGATIVE 08/31/2017 1356   LEUKOCYTESUR NEGATIVE 08/31/2017 1356   Recent Results (from the past 240 hour(s))  Surgical pcr screen     Status: None   Collection Time: 09/01/17 11:05 PM  Result Value Ref Range Status   MRSA, PCR NEGATIVE NEGATIVE  Final   Staphylococcus aureus NEGATIVE NEGATIVE Final    Comment: (NOTE) The Xpert SA Assay (FDA approved for NASAL specimens in patients 65 years of age and older), is one component of a comprehensive surveillance program. It is not intended to diagnose infection nor to guide or monitor treatment. Performed at [facility], [place] [address] [place], [place] [address]       Radiology Studies: No results found.  Scheduled Meds: . aspirin EC  81 mg Oral Daily  . atorvastatin  20 mg Oral Daily  . calcitRIOL  0.25 mcg Oral Q M,W,F  . cloNIDine  0.1 mg Oral Q24H  . enoxaparin (LOVENOX) injection  30 mg Subcutaneous Q24H  . ezetimibe  10 mg Oral Daily  . gabapentin  100 mg Oral TID  . insulin aspart      . insulin aspart  0-15 Units Subcutaneous TID WC  . insulin aspart  0-5 Units Subcutaneous QHS  . insulin detemir  15 Units Subcutaneous Daily  . isosorbide mononitrate  30 mg Oral Daily  . labetalol  400 mg Oral BID  . pantoprazole  40 mg Oral Daily  . sodium chloride flush  3 mL Intravenous Q12H   Continuous Infusions: . sodium chloride    . sodium chloride Stopped (08/31/17 1908)  . sodium chloride     . sodium chloride 10 mL/hr at 09/02/17 1020  . furosemide Stopped (09/01/17 2243)     LOS: 2 days   Time spent: 25 minutes.  Kathryn Pour, MD [facility] [email or web address] Password [facility] 09/02/2017, 3:18 PM

## 2017-09-02 NOTE — Anesthesia Postprocedure Evaluation (Signed)
Anesthesia Post Note  Patient: Kathryn Beck  Procedure(s) Performed: creation of left arm ARTERIOVENOUS (AV) FISTULA (Left Arm Upper)     Patient location during evaluation: PACU Anesthesia Type: MAC Level of consciousness: awake and alert Pain management: pain level controlled Vital Signs Assessment: post-procedure vital signs reviewed and stable Respiratory status: spontaneous breathing, nonlabored ventilation, respiratory function stable and patient connected to nasal cannula oxygen Cardiovascular status: stable and blood pressure returned to baseline Postop Assessment: no apparent nausea or vomiting Anesthetic complications: no    Last Vitals:  Vitals:   09/02/17 1252 09/02/17 1322  BP: 117/68 116/61  Pulse: 80 74  Resp: 14   Temp: 36.6 C 36.6 C  SpO2: 90% 97%    Last Pain:  Vitals:   09/02/17 1510  TempSrc:   PainSc: 3                  [name]

## 2017-09-02 NOTE — Progress Notes (Signed)
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  Subjective:   Seen with daughter and husband in room  For AV fistula today, appreciate assistance of vascular surgery  Confirmed desire to pursue peritoneal dialysis of primary ESRD treatment  Diuresing well on current regimen, edema improving, potassium 4.2  Expresses desire to continue care with me upon discharge  Weight down 5lb from yesterday  06/12 0701 - 06/13 0700 In: 302 [P.O.:240; IV Piggyback:62] Out: 3850 [Urine:3850]  Filed Weights   09/01/17 0700 09/02/17 0111 09/02/17 0920  Weight: 96.9 kg (213 lb 10 oz) 94.8 kg (208 lb 15.9 oz) 94.3 kg (208 lb)    Scheduled Meds: . [MAR Hold] aspirin EC  81 mg Oral Daily  . [MAR Hold] atorvastatin  20 mg Oral Daily  . [MAR Hold] calcitRIOL  0.25 mcg Oral Q M,W,F  . [MAR Hold] cloNIDine  0.1 mg Oral Q24H  . [MAR Hold] enoxaparin (LOVENOX) injection  30 mg Subcutaneous Q24H  . [MAR Hold] ezetimibe  10 mg Oral Daily  . [MAR Hold] gabapentin  100 mg Oral TID  . [MAR Hold] insulin aspart  0-15 Units Subcutaneous TID WC  . [MAR Hold] insulin aspart  0-5 Units Subcutaneous QHS  . insulin detemir  15 Units Subcutaneous Daily  . [MAR Hold] isosorbide mononitrate  30 mg Oral Daily  . [MAR Hold] labetalol  400 mg Oral BID  . [MAR Hold] pantoprazole  40 mg Oral Daily  . [MAR Hold] sodium chloride flush  3 mL Intravenous Q12H   Continuous Infusions: . [MAR Hold] sodium chloride    . sodium chloride Stopped (08/31/17 1908)  . [MAR Hold] sodium chloride    . sodium chloride 10 mL/hr at 09/02/17 1020  . [MAR Hold] furosemide Stopped (09/01/17 2243)   PRN Meds:.[MAR Hold] sodium chloride, 0.9 % irrigation (POUR BTL), [MAR Hold] acetaminophen, [MAR Hold] albuterol, [MAR Hold] calcium carbonate (dosed in mg elemental calcium), [MAR Hold] camphor-menthol **AND** [MAR Hold] hydrOXYzine, [MAR Hold] docusate sodium, [MAR  Hold] feeding supplement (NEPRO CARB STEADY), heparin irrigation 6000 unit, lidocaine-EPINEPHrine, [MAR Hold] methocarbamol, [MAR Hold] ondansetron (ZOFRAN) IV, [MAR Hold] sodium chloride flush, [MAR Hold] sorbitol, [MAR Hold] zolpidem  Current Labs: reviewed  Results for RANIA, PROTHERO (MRN [id number]) as of 09/01/2017 14:46  Ref. Range 09/01/2017 05:42  Saturation Ratios Latest Ref Range: 10.4 - 31.8 % 4 (L)  Ferritin Latest Ref Range: 11 - 307 ng/mL 288   Physical Exam:  Blood pressure 132/69, pulse 84, temperature 98.6 F (37 C), temperature source Oral, resp. rate 20, height 5\' 4"  (1.626 m), weight 94.3 kg (208 lb), SpO2 97 %. GEN: chronically ill appearing, NAD ENT: Poor dentition NAD EYES: EOMI CV: RRR no rub, nl s1s2 PULM: diminished in bases with crackles, clear in upper lobes ABD: s/nt/nd SKIN: no rashes/lesions EXT:3+ LEE, pitting into knees -- improved this AM  A 1. 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 Hypervoelmia / LEE / SOB / Pulm Edema likely primarily from #1; improving with diuresis 3. Anemia, microcytic, was told had IDA - s/p 510mg  IV Madilyn Hook 09/01/17; not on ESA 4. HTN on CCB and clonidine daily 5. Recent CAP of LLL s/p levofloxacin 6. ? 2HPTH, on C3; PTH 128, P WNL 7. GERD  P 1. No immediate dialysis needs, patient with strong preference for peritoneal dialysis,  2. As she has requested to follow-up with me upon discharge I have reached  out to home therapies and they will begin plans for peritoneal dialysis, first home visit; very likely will need to pursue PD catheter in the very near future if does not start dialysis during this admission 3. Given that renal function is holding stable with aggressive diuresis I am hopeful we will not need to start hemodialysis during this admission 4. I am glad that she is pursuing AV fistula as back-up today 5. If hemoglobin does not improve with IV iron she will need another dose as well as initiation of  erythropoietin 6. Continue diuretics at current dosing 7. Daily weights, Daily Renal Panel, Strict I/Os, Avoid nephrotoxins (NSAIDs, judicious IV Contrast)  Medication Issues;  Preferred narcotic agents for pain control are hydromorphone, fentanyl, and methadone. Morphine should not be used. Oxydone is not preferred.   Baclofen should not be used.  Avoid Fleets and Magnesium Citrate Bowel Preps    Pearson Grippe MD 09/02/2017, 11:27 AM  Recent Labs  Lab 08/31/17 1343 08/31/17 1352 08/31/17 2009 09/01/17 0542 09/02/17 0628  NA 145 144  --  143 143  K 3.7 3.7  --  4.2 4.2  CL 107 106  --  109 102  CO2 26  --   --  24 27  GLUCOSE 202* 208*  --  78 315*  BUN 42* 42*  --  45* 50*  CREATININE 4.32* 4.00*  --  4.45* 4.61*  CALCIUM 8.8*  --   --  8.3* 8.6*  PHOS  --   --  4.4  --   --    Recent Labs  Lab 08/31/17 1343  08/31/17 2330 09/01/17 0747 09/02/17 0628  WBC 14.0*  --  13.8*  --  11.3*  NEUTROABS  --   --  9.5*  --   --   HGB 7.7*   < > 6.8* 7.8* 7.3*  HCT 26.5*   < > 23.0* 25.9* 24.5*  MCV 74.0*  --  73.2*  --  75.9*  PLT 403*  --  363  --  350   < > = values in this interval not displayed.

## 2017-09-02 NOTE — Progress Notes (Signed)
Called anesthesia regarding CBG 315. MD stated to give Novolog 10 units SQ and Levemir 15 units SQ now and recheck CBG in 30 minutes. Orders received and carried out.

## 2017-09-02 NOTE — Transfer of Care (Signed)
Immediate Anesthesia Transfer of Care Note  Patient: Kathryn Beck  Procedure(s) Performed: creation of left arm ARTERIOVENOUS (AV) FISTULA (Left Arm Upper)  Patient Location: PACU  Anesthesia Type:MAC  Level of Consciousness: drowsy and patient cooperative  Airway & Oxygen Therapy: Patient Spontanous Breathing and Patient connected to face mask oxygen  Post-op Assessment: Report given to RN, Post -op Vital signs reviewed and stable and Patient moving all extremities X 4  Post vital signs: Reviewed and stable  Last Vitals:  Vitals Value Taken Time  BP 117/67 09/02/2017 12:22 PM  Temp 36.2 C 09/02/2017 12:22 PM  Pulse 81 09/02/2017 12:25 PM  Resp 21 09/02/2017 12:25 PM  SpO2 99 % 09/02/2017 12:25 PM  Vitals shown include unvalidated device data.  Last Pain:  Vitals:   09/02/17 1222  TempSrc:   PainSc: (P) 0-No pain         Complications: No apparent anesthesia complications

## 2017-09-02 NOTE — Interval H&P Note (Signed)
History and Physical Interval Note:  09/02/2017 9:40 AM  Kathryn Beck  has presented today for surgery, with the diagnosis of CHRONIC KIDNEY DISEASE V FOR HEMODIALYSIS ACCESS  The various methods of treatment have been discussed with the patient and family. After consideration of risks, benefits and other options for treatment, the patient has consented to  Procedure(s): ARTERIOVENOUS (AV) FISTULA CREATION VERSUS GRAFT LEFT ARM (Left) as a surgical intervention .  The patient's history has been reviewed, patient examined, no change in status, stable for surgery.  I have reviewed the patient's chart and labs.  Questions were answered to the patient's satisfaction.     Annamarie Major

## 2017-09-02 NOTE — Anesthesia Preprocedure Evaluation (Signed)
Anesthesia Evaluation  Patient identified by MRN, date of birth, ID band Patient awake    Reviewed: Allergy & Precautions, H&P , NPO status , Patient's Chart, lab work & pertinent test results  History of Anesthesia Complications Negative for: history of anesthetic complications  Airway Mallampati: IV  TM Distance: <3 FB Neck ROM: full    Dental  (+) Dental Advidsory Given   Pulmonary shortness of breath and at rest, sleep apnea ,  No CPAP   breath sounds clear to auscultation       Cardiovascular hypertension, On Medications and Pt. on home beta blockers +CHF  + Valvular Problems/Murmurs  Rhythm:regular  EF 70% per ECHO 2017   Neuro/Psych negative neurological ROS  negative psych ROS   GI/Hepatic GERD  ,  Endo/Other  diabetes, Well Controlled, Type 2, Insulin Dependent  Renal/GU CRFRenal disease     Musculoskeletal   Abdominal   Peds  Hematology  (+) anemia ,   Anesthesia Other Findings   Reproductive/Obstetrics negative OB ROS                             Anesthesia Physical Anesthesia Plan  ASA: III  Anesthesia Plan: MAC   Post-op Pain Management:    Induction: Intravenous  PONV Risk Score and Plan: 2 and Ondansetron and Treatment may vary due to age or medical condition  Airway Management Planned: Nasal Cannula  Additional Equipment: None  Intra-op Plan:   Post-operative Plan:   Informed Consent: I have reviewed the patients History and Physical, chart, labs and discussed the procedure including the risks, benefits and alternatives for the proposed anesthesia with the patient or authorized representative who has indicated his/her understanding and acceptance.   Dental advisory given  Plan Discussed with: CRNA and Surgeon  Anesthesia Plan Comments:         Anesthesia Quick Evaluation

## 2017-09-03 ENCOUNTER — Encounter (HOSPITAL_COMMUNITY): Payer: Self-pay

## 2017-09-03 DIAGNOSIS — D3500 Benign neoplasm of unspecified adrenal gland: Secondary | ICD-10-CM | POA: Insufficient documentation

## 2017-09-03 LAB — CBC
HEMATOCRIT: 24 % — AB (ref 36.0–46.0)
HEMOGLOBIN: 7.2 g/dL — AB (ref 12.0–15.0)
MCH: 22.6 pg — ABNORMAL LOW (ref 26.0–34.0)
MCHC: 30 g/dL (ref 30.0–36.0)
MCV: 75.2 fL — ABNORMAL LOW (ref 78.0–100.0)
Platelets: 353 10*3/uL (ref 150–400)
RBC: 3.19 MIL/uL — AB (ref 3.87–5.11)
RDW: 16.3 % — ABNORMAL HIGH (ref 11.5–15.5)
WBC: 8.4 10*3/uL (ref 4.0–10.5)

## 2017-09-03 LAB — BASIC METABOLIC PANEL
ANION GAP: 11 (ref 5–15)
BUN: 54 mg/dL — ABNORMAL HIGH (ref 6–20)
CO2: 26 mmol/L (ref 22–32)
Calcium: 8.5 mg/dL — ABNORMAL LOW (ref 8.9–10.3)
Chloride: 104 mmol/L (ref 101–111)
Creatinine, Ser: 4.63 mg/dL — ABNORMAL HIGH (ref 0.44–1.00)
GFR, EST AFRICAN AMERICAN: 11 mL/min — AB (ref >60)
GFR, EST NON AFRICAN AMERICAN: 9 mL/min — AB (ref >60)
GLUCOSE: 278 mg/dL — AB (ref 65–99)
POTASSIUM: 4 mmol/L (ref 3.5–5.1)
Sodium: 141 mmol/L (ref 135–145)

## 2017-09-03 LAB — GLUCOSE, CAPILLARY
GLUCOSE-CAPILLARY: 276 mg/dL — AB (ref 65–99)
GLUCOSE-CAPILLARY: 239 mg/dL — AB (ref 65–99)
GLUCOSE-CAPILLARY: 401 mg/dL — AB (ref 65–99)
Glucose-Capillary: 358 mg/dL — ABNORMAL HIGH (ref 65–99)
Glucose-Capillary: 335 mg/dL — ABNORMAL HIGH (ref 65–99)

## 2017-09-03 MED ORDER — INSULIN DETEMIR 100 UNIT/ML ~~LOC~~ SOLN
40.0000 [IU] | Freq: Every day | SUBCUTANEOUS | Status: DC
  Administered 2017-09-03: 40 [IU] via SUBCUTANEOUS
  Filled 2017-09-03 (×2): qty 0.4

## 2017-09-03 MED ORDER — INSULIN ASPART 100 UNIT/ML ~~LOC~~ SOLN
10.0000 [IU] | Freq: Three times a day (TID) | SUBCUTANEOUS | Status: DC
  Administered 2017-09-03: 10 [IU] via SUBCUTANEOUS

## 2017-09-03 MED ORDER — SODIUM CHLORIDE 0.9 % IV SOLN
510.0000 mg | Freq: Once | INTRAVENOUS | Status: AC
  Administered 2017-09-04: 510 mg via INTRAVENOUS
  Filled 2017-09-03 (×2): qty 17

## 2017-09-03 MED ORDER — FUROSEMIDE 10 MG/ML IJ SOLN
160.0000 mg | Freq: Three times a day (TID) | INTRAVENOUS | Status: DC
  Administered 2017-09-03 – 2017-09-07 (×12): 160 mg via INTRAVENOUS
  Filled 2017-09-03 (×2): qty 16
  Filled 2017-09-03: qty 2
  Filled 2017-09-03 (×3): qty 16
  Filled 2017-09-03: qty 14
  Filled 2017-09-03: qty 10
  Filled 2017-09-03 (×4): qty 16
  Filled 2017-09-03: qty 14

## 2017-09-03 MED ORDER — HYDROMORPHONE HCL 2 MG PO TABS
1.0000 mg | ORAL_TABLET | ORAL | Status: DC | PRN
  Administered 2017-09-03 – 2017-09-04 (×2): 1 mg via ORAL
  Filled 2017-09-03 (×2): qty 1

## 2017-09-03 MED ORDER — INSULIN ASPART 100 UNIT/ML ~~LOC~~ SOLN
5.0000 [IU] | Freq: Three times a day (TID) | SUBCUTANEOUS | Status: DC
  Administered 2017-09-03: 5 [IU] via SUBCUTANEOUS

## 2017-09-03 MED ORDER — DARBEPOETIN ALFA 60 MCG/0.3ML IJ SOSY
60.0000 ug | PREFILLED_SYRINGE | INTRAMUSCULAR | Status: DC
  Administered 2017-09-03: 60 ug via SUBCUTANEOUS
  Filled 2017-09-03 (×2): qty 0.3

## 2017-09-03 NOTE — Progress Notes (Signed)
Progress Note  Patient Name: ALEJAH ARISTIZABAL Date of Encounter: 09/03/2017  Primary Cardiologist: Rozann Lesches, MD   Subjective   Patient feels better   NO CP  No SOB  Inpatient Medications    Scheduled Meds: . aspirin EC  81 mg Oral Daily  . atorvastatin  20 mg Oral Daily  . calcitRIOL  0.25 mcg Oral Q M,W,F  . cloNIDine  0.1 mg Oral Q24H  . enoxaparin (LOVENOX) injection  30 mg Subcutaneous Q24H  . ezetimibe  10 mg Oral Daily  . gabapentin  100 mg Oral TID  . insulin aspart  0-15 Units Subcutaneous TID WC  . insulin aspart  0-5 Units Subcutaneous QHS  . insulin detemir  15 Units Subcutaneous Daily  . isosorbide mononitrate  30 mg Oral Daily  . labetalol  400 mg Oral BID  . pantoprazole  40 mg Oral Daily  . sodium chloride flush  3 mL Intravenous Q12H   Continuous Infusions: . sodium chloride    . sodium chloride Stopped (08/31/17 1908)  . sodium chloride    . sodium chloride 10 mL/hr at 09/02/17 1020  . furosemide 120 mg (09/03/17 0552)   PRN Meds: sodium chloride, acetaminophen, albuterol, calcium carbonate (dosed in mg elemental calcium), camphor-menthol **AND** hydrOXYzine, docusate sodium, feeding supplement (NEPRO CARB STEADY), methocarbamol, ondansetron (ZOFRAN) IV, sodium chloride flush, sorbitol, zolpidem   Vital Signs    Vitals:   09/02/17 2003 09/02/17 2006 09/02/17 2256 09/03/17 0442  BP: 127/61  130/65 128/65  Pulse: 81  80 81  Resp: 18   20  Temp:  98.4 F (36.9 C)  97.6 F (36.4 C)  TempSrc:  Oral  Oral  SpO2: 94%   95%  Weight:    96.6 kg (212 lb 15.4 oz)  Height:        Intake/Output Summary (Last 24 hours) at 09/03/2017 0625 Last data filed at 09/03/2017 0557 Gross per 24 hour  Intake 1073 ml  Output 1375 ml  Net -302 ml   Filed Weights   09/02/17 0111 09/02/17 0920 09/03/17 0442  Weight: 94.8 kg (208 lb 15.9 oz) 94.3 kg (208 lb) 96.6 kg (212 lb 15.4 oz)    Telemetry    SR  - Personally Reviewed  ECG      Physical Exam     GEN: No acute distress.   Neck: JVP is increased    Cardiac: RRR, no murmurs, rubs, or gallops.  Respiratory: Rales at R base   GI: Soft, nontender, non-distended  MS: 1+ edema; No deformity. Neuro:  Nonfocal  Psych: Normal affect   Labs    Chemistry Recent Labs  Lab 08/31/17 1343 08/31/17 1352 09/01/17 0542 09/02/17 0628  NA 145 144 143 143  K 3.7 3.7 4.2 4.2  CL 107 106 109 102  CO2 26  --  24 27  GLUCOSE 202* 208* 78 315*  BUN 42* 42* 45* 50*  CREATININE 4.32* 4.00* 4.45* 4.61*  CALCIUM 8.8*  --  8.3* 8.6*  PROT 6.3*  --   --   --   ALBUMIN 2.0*  --   --   --   AST 16  --   --   --   ALT 18  --   --   --   ALKPHOS 94  --   --   --   BILITOT 0.5  --   --   --   GFRNONAA 10*  --  10* 9*  GFRAA 12*  --  11* 11*  ANIONGAP 12  --  10 14     Hematology Recent Labs  Lab 08/31/17 1343  08/31/17 2330 09/01/17 0747 09/02/17 0628  WBC 14.0*  --  13.8*  --  11.3*  RBC 3.58*  --  3.14*  --  3.23*  HGB 7.7*   < > 6.8* 7.8* 7.3*  HCT 26.5*   < > 23.0* 25.9* 24.5*  MCV 74.0*  --  73.2*  --  75.9*  MCH 21.5*  --  21.7*  --  22.6*  MCHC 29.1*  --  29.6*  --  29.8*  RDW 15.9*  --  15.4  --  16.2*  PLT 403*  --  363  --  350   < > = values in this interval not displayed.    Cardiac EnzymesNo results for input(s): TROPONINI in the last 168 hours.  Recent Labs  Lab 08/31/17 1350  TROPIPOC 0.04     BNP Recent Labs  Lab 08/31/17 1343  BNP 84.5     DDimer No results for input(s): DDIMER in the last 168 hours.   Radiology    No results found.  Cardiac Studies   Echo 6/12:  LVEF 55 to 36%  Gr 1 diasltilic dysfunction    Patient Profile     62 y.o. female hx of CP, diastolic CHF, CKD (Cr 4) admitted with worsening LE edema    Assessment & Plan    1  Acute on chronic diastolic CHF  Clinically improved   Diuresing  Renal following with recommendations for lasix dosing    Pt after d/c will follow up with Myles Gip in [place] office    2  CKD  Pt  being prepped for peritoneal dialysis   Renal following     3  Hx CP  No CP at present   No documented CAD   (Review of myovue from 2018, not cliearly infarct.   Sugg of breast attenuation   LVEF normal at time)  4  HTN  Good control      5  Anemia   Hgb 7.2 today    6  HL  Continue medical Rx    For questions or updates, please contact [place] Please consult [email or web address] for contact info under Cardiology/STEMI.      Signed, Dorris Carnes, MD  09/03/2017, 6:25 AM

## 2017-09-03 NOTE — Progress Notes (Signed)
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  Subjective:   SCr stable this AM, K 4.0  1.3L UOP reported  S/p L BC AVF, L RC Did not work  Weights really not showing decline  Less edema, denies dyspnea  Needs PT to see today   06/13 0701 - 06/14 0700 In: 1073 [P.O.:720; I.V.:253; IV Piggyback:100] Out: 1610 [Urine:1300; Blood:75]  Filed Weights   09/02/17 0111 09/02/17 0920 09/03/17 0442  Weight: 94.8 kg (208 lb 15.9 oz) 94.3 kg (208 lb) 96.6 kg (212 lb 15.4 oz)    Scheduled Meds: . aspirin EC  81 mg Oral Daily  . atorvastatin  20 mg Oral Daily  . calcitRIOL  0.25 mcg Oral Q M,W,F  . cloNIDine  0.1 mg Oral Q24H  . enoxaparin (LOVENOX) injection  30 mg Subcutaneous Q24H  . ezetimibe  10 mg Oral Daily  . gabapentin  100 mg Oral TID  . insulin aspart  0-15 Units Subcutaneous TID WC  . insulin aspart  0-5 Units Subcutaneous QHS  . insulin aspart  5 Units Subcutaneous TID WC  . insulin detemir  40 Units Subcutaneous Daily  . isosorbide mononitrate  30 mg Oral Daily  . labetalol  400 mg Oral BID  . pantoprazole  40 mg Oral Daily  . sodium chloride flush  3 mL Intravenous Q12H   Continuous Infusions: . sodium chloride    . sodium chloride Stopped (08/31/17 1908)  . sodium chloride    . sodium chloride 10 mL/hr at 09/02/17 1020  . furosemide 120 mg (09/03/17 0552)   PRN Meds:.sodium chloride, acetaminophen, albuterol, calcium carbonate (dosed in mg elemental calcium), camphor-menthol **AND** hydrOXYzine, docusate sodium, feeding supplement (NEPRO CARB STEADY), methocarbamol, ondansetron (ZOFRAN) IV, sodium chloride flush, sorbitol, zolpidem  Current Labs: reviewed  Results for Kathryn Beck, Kathryn Beck (MRN [id number]) as of 09/01/2017 14:46  Ref. Range 09/01/2017 05:42  Saturation Ratios Latest Ref Range: 10.4 - 31.8 % 4 (L)  Ferritin Latest Ref Range: 11 - 307 ng/mL 288   Results for Kathryn Beck, Kathryn Beck (MRN [id number]) as of 09/03/2017 09:00  Ref. Range 08/31/2017 22:57  Protein Creatinine Ratio Latest Ref Range: 0.00 - 0.15 mg/mgCre 8.69 (H)   Physical Exam:  Blood pressure 128/65, pulse 81, temperature 97.6 F (36.4 C), temperature source Oral, resp. rate 20, height 5\' 4"  (1.626 m), weight 96.6 kg (212 lb 15.4 oz), SpO2 95 %. GEN: chronically ill appearing, NAD ENT: Poor dentition NAD EYES: EOMI CV: RRR no rub, nl s1s2 PULM: diminished in bases with crackles, clear in upper lobes ABD: s/nt/nd SKIN: no rashes/lesions EXT:2+ LEE, pitting into knees -- improved this AM LUE AVF, +B/T  A 1. 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 Hypervoelmia / LEE / SOB / Pulm Edema likely primarily from #1; suboptimal diuresis 3. Anemia, microcytic, was told had IDA - s/p 510mg  IV Kathryn Beck 09/01/17; starrt ESA today 4. HTN on CCB and clonidine daily 5. Recent CAP of LLL s/p levofloxacin 6. ? 2HPTH, on C3; PTH pending, P WNL 7. GERD 8. S/p LUE BC AVF 09/02/17  P 1. Inc lasix to 160mg  today 2. Begin  aranesp 82mcg qFriday 3. Will need Fereheme 2nd dose tomorrow 4. PT 5. No immediate dialysis needs, patient with strong preference for peritoneal dialysis, will need close follow-up with outpatient nephrology to make this happen; she wishes to cont nephrology f/u with me/[facility] 6. Have begun referral to  HT 7. Daily weights, Daily Renal Panel, Strict I/Os, Avoid nephrotoxins (NSAIDs, judicious IV Contrast)  8. Medication Issues;  Preferred narcotic agents for pain control are hydromorphone, fentanyl, and methadone. Morphine should not be used. Oxydone is not preferred.   Baclofen should not be used.  Avoid Fleets and Magnesium Citrate Bowel Preps    Kathryn Grippe MD 09/03/2017, 8:46 AM  Recent Labs  Lab 08/31/17 2009 09/01/17 0542 09/02/17 0628 09/03/17 0534  NA  --  143 143 141  K  --  4.2 4.2 4.0  CL  --  109 102 104  CO2  --  24 27 26   GLUCOSE  --  78 315* 278*  BUN   --  45* 50* 54*  CREATININE  --  4.45* 4.61* 4.63*  CALCIUM  --  8.3* 8.6* 8.5*  PHOS 4.4  --   --   --    Recent Labs  Lab 08/31/17 2330 09/01/17 0747 09/02/17 0628 09/03/17 0534  WBC 13.8*  --  11.3* 8.4  NEUTROABS 9.5*  --   --   --   HGB 6.8* 7.8* 7.3* 7.2*  HCT 23.0* 25.9* 24.5* 24.0*  MCV 73.2*  --  75.9* 75.2*  PLT 363  --  350 353

## 2017-09-03 NOTE — Progress Notes (Signed)
Physical Therapy Treatment Patient Details Name: SEREN CHALOUX MRN: [id number] DOB: [birth year] Today's Date: 09/03/2017    History of Present Illness 62 y.o. female admitted 6/11 with SOB and BLE swelling. PMH includes: HTN, Diastolic CHF, DM2, Anemia, CKD, Foot surgery.     PT Comments    Pt less limited by pain this visit, able to mobilize to EOB and tolerate standing for 10 minutes inside RW. Patient very weak and reports stiffness in BLE, unable to safely ambulate this visit. Patient required mod A to assist with standing from elevated bed, and was able to side step along EOB with min guard. As patient wishes to return home, she will need to progress OOB mobility over next several days. Discussed with RN and encouraged more mobility between PT visits. Reviewed therex with pt and sister.      Follow Up Recommendations  Home health PT;Supervision/Assistance - 24 hour     Equipment Recommendations  (TBD)    Recommendations for Other Services OT consult     Precautions / Restrictions Precautions Precautions: Fall Restrictions Weight Bearing Restrictions: No    Mobility  Bed Mobility Overal bed mobility: Needs Assistance Bed Mobility: Supine to Sit     Supine to sit: Supervision     General bed mobility comments: increased time and effort able to supine to sit with physical assistance.  Transfers Overall transfer level: Needs assistance Equipment used: Rolling walker (2 wheeled) Transfers: Sit to/from Stand Sit to Stand: Mod assist         General transfer comment: Mod A to help power up off elevated bed.   Ambulation/Gait             General Gait Details: unable to 2/2 weakness/dizziness   Stairs             Wheelchair Mobility    Modified Rankin (Stroke Patients Only)       Balance                                            Cognition Arousal/Alertness: Awake/alert Behavior During Therapy: WFL for tasks  assessed/performed Overall Cognitive Status: Within Functional Limits for tasks assessed                                        Exercises General Exercises - Lower Extremity Long Arc Quad: 10 reps;Right;Left    General Comments        Pertinent Vitals/Pain Pain Assessment: Faces Faces Pain Scale: Hurts a little bit Pain Location: BLE Pain Descriptors / Indicators: Aching;Throbbing Pain Intervention(s): Limited activity within patient's tolerance    Home Living                      Prior Function            PT Goals (current goals can now be found in the care plan section) Acute Rehab PT Goals Patient Stated Goal: reduce pain, return home with husband care PT Goal Formulation: With patient Time For Goal Achievement: 09/08/17 Potential to Achieve Goals: Fair Progress towards PT goals: Progressing toward goals    Frequency    Min 3X/week      PT Plan Current plan remains appropriate    Co-evaluation  AM-PAC PT "6 Clicks" Daily Activity  Outcome Measure  Difficulty turning over in bed (including adjusting bedclothes, sheets and blankets)?: A Little Difficulty moving from lying on back to sitting on the side of the bed? : A Little Difficulty sitting down on and standing up from a chair with arms (e.g., wheelchair, bedside commode, etc,.)?: A Little Help needed moving to and from a bed to chair (including a wheelchair)?: A Little Help needed walking in hospital room?: Total Help needed climbing 3-5 steps with a railing? : Total 6 Click Score: 14    End of Session Equipment Utilized During Treatment: Gait belt Activity Tolerance: Patient limited by pain Patient left: in bed;with call bell/phone within reach;with nursing/sitter in room Nurse Communication: Mobility status PT Visit Diagnosis: Pain;Muscle weakness (generalized) (M62.81) Pain - Right/Left: (BLE) Pain - part of body: Leg     Time: 1445-1510 PT Time  Calculation (min) (ACUTE ONLY): 25 min  Charges:  $Therapeutic Exercise: 8-22 mins                    G Codes:       Reinaldo Berber, PT, DPT Acute Rehab Services Pager: [phone]     Reinaldo Berber 09/03/2017, 3:29 PM

## 2017-09-03 NOTE — Progress Notes (Signed)
Kathryn Beck     MRN: [id number]      DOB: 1956-01-13   HPI Kathryn Beck is here for follow up of recent hospitalization for CAP from 5/29 to 08/20/2017.   Her active problems noted include uncontrolled IDDM and acute on chronic renal failure. Kathryn Beck is accompanied by her son, who states that the day following her discharge from  The hospital , she had to go to [name] ED in [place] because of increasing difficulty breathing where she was nebulized and advised that she needed to be started on breathing treatments at home [facility] continues to c/o difficulty breathing, leg swelling and back pain. Of note she has not seen her nephrologist for some time, and unfortunately her renal function is severely compromised resulting in significant leg swelling ,and  anemia,   I  I spent some time during the visit explaining to Kathryn Beck and her son how important it is that she get her diabetes under control to try to prolong the life of her kidneys and also the urgent need to see nephrology and he real possibility that she may require dialysis, and that she also needs medication to help to stimulate red cell production , an appointment was obtained prior to conc;lusion of the visit  Review of abdominal scan described an adrenal nodule which will need to be followed/ evaluated by her nephrologist and endocrinologist    ROS See HPI  Denies current fever or chills.c/o fatigue,  Denies sinus pressure, nasal congestion , sore throat  C/o  chest congestion,  cough  And shortness of breath. C/o posterior  chest pains,  and leg swelling Denies abdominal pain, nausea, vomiting,diarrhea or constipation. Has poor appetitie  Denies dysuria, frequency, hesitancy or incontinence. C/o generalized joint pain and poor mobility  Denies headaches, seizures, c/o lower extremity  numbness,  And ingling. Denies depression, anxiety or insomnia. Denies skin break down or rash.   PE  BP (!) 170/84   Pulse (!) 102   Resp  16   Ht 5\' 4"  (1.626 m)   Wt 226 lb (102.5 kg)   SpO2 97%   BMI 38.79 kg/m   Patient alert and oriented, chronically ill appearing  and in mild to moderate cardiopulmonary distress.  HEENT: No facial asymmetry, EOMI,   oropharynx pink and moist.  Neck supple no JVD, no mass.  Chest: Clear to auscultation bilaterally.decreased no wheezes heard  CVS: S1, S2 no murmurs, no S3.Regular rate.  ABD: Soft non tender.   Ext: bilateral pitting edema, one plus pitting   Kathryn: Adequate though reduced 5/29 to 5/31 ROM spine, shoulders, hips and knees.  Skin: Intact, no ulcerations or rash noted.  Psych: Good eye contact, normal affect. Memory intact not anxious or depressed appearing.  CNS: CN 2-12 intact, power,  normal throughout.no focal deficits noted.   [facility] discharge follow-up Patient hospitalized with CAP from 5/29 to 07/23/2017 for CAP, with acute on chronic renal failure , still very symptomatic , however , main complaint is of shortness of breath and leg swelling, overall clinical picture at visit is more concerning for worsening renal failure. Appt made for pt to be evaluate by her nephrologist in 2 days and labs ordered per recommendation . No indication for neb machine at home as per ED recommendation, and without a clear indication/ diagnosis to support this.th her uncontrolled hypertension I would not elect to go this route without a clear  Adrenal hyperplasia noted on scans  should be further addressed by her endocrinologist and nephrologist  Malignant hypertension Elevated at visit, however pt reports that she has not taken medication on the day of the isit, no adjustment made in her medication

## 2017-09-03 NOTE — Assessment & Plan Note (Addendum)
Patient hospitalized with CAP from 5/29 to 07/23/2017 for CAP, with acute on chronic renal failure , still very symptomatic , however , main complaint is of shortness of breath and leg swelling, overall clinical picture at visit is more concerning for worsening renal failure. Appt made for pt to be evaluate by her nephrologist in 2 days and labs ordered per recommendation . No indication for neb machine at home as per ED recommendation, and without a clear indication/ diagnosis to support this.th her uncontrolled hypertension I would not elect to go this route without a clear  Adrenal hyperplasia noted on scans should be further addressed by her endocrinologist and nephrologist

## 2017-09-03 NOTE — Progress Notes (Signed)
PROGRESS NOTE  Kathryn Beck  FYB:[id number] DOB: [birth year] DOA: 08/31/2017 PCP: Fayrene Helper, MD   Brief Narrative: Kathryn Beck is a 62 y.o. female with medical history significant of T2DM, Stage IV CKD, chronic HFpEF, HTN, HLD, OSA not compliant with CPAP, morbid obesity whot presented to the ED for dyspnea and leg swelling for the past 2 weeks following recent admission for pneumonia (got IVF's, discharged 5/31). She was evaluated by her Nephrologist (Dr. Lowanda Foster) on 08/25/17 and her po lasix was increased to 120mg  qAM and 80mg  qPM. She states her weight has remained stable if not slightly decreased since that visit (218lbs this AM), however her LE edema has progressed, making it difficult to ambulate. Evaluation showed creatinine 4, having steadily increased. Nephrology was consulted and after discussion with patient the decision was made to initiate dialysis. LUE AVF was placed 6/13 as back up to the patient's preferred method, peritoneal dialysis. Outpatient nephrology reported an M spike in urine, so SPEP and light chains were sent and pending. Lasix has been ineffective in diuresis so dose is increased by nephrology.  Assessment & Plan: Principal Problem:   CKD (chronic kidney disease) stage 5, GFR less than 15 ml/min (HCC) Active Problems:   Uncontrolled diabetes mellitus with diabetic nephropathy, with long-term current use of insulin (HCC)   HLD (hyperlipidemia)   Sleep apnea   Anemia of chronic disease   Acute exacerbation of CHF (congestive heart failure) (HCC)   Edema due to hypoalbuminemia  Progressive CKD stage V: Suspected diabetic nephropathy with nephrotic-range proteinuria.  - Left brachiocephalic fistula placed 08/17/7822 by Dr. Trula Slade for back up, as pt wishes to have peritoneal dialysis primarily. Will give tylenol and low dose po hydromorphone for pain control. - Will follow up with Dr. Joelyn Oms as outpatient. No urgent HD needs, Cr stable today.  - Hold  NSAIDs, ARB.  - SPEP, kappa/lambda light chains sent to further evaluate M spike on urine as outpatient. Note Ca is low, not elevated.   Acute on chronic diastolic CHF: DC weight 235TIR. Has improved 213lbs > 208lbs during this hospitalization. Echo 6/12 w/EF 55-60%, G1DD, no WMA's. - Wt stable, still edematous, lasix increased today. - Appreciate cardiology assistance  T2DM: HbA1c 11% (poor control). Initially insulin held due to hypoglycemia.  - Increasing CBGs, will give levemir 40u this AM and monitor, will likely need additional PM dose. Add mealtime novolog and titrate as needed. Home doses of levemir and humalog suggest this will not be nearly enough once po intake picks up.  Iron deficiency anemia and anemia of chronic renal disease:  - Given 1u PRBCs, up to 7.8g/dl and slowly trending downward without bleeding.  - Given feraheme x1, aranesp qFriday; repeat feraheme 6/15.   HTN: At goal. - Continue clonidine, labetalol.   HLD:  - Continue statin, zetia  DVT prophylaxis: Lovenox Code Status: Full Family Communication: None at bedside this AM Disposition Plan: Home when completed work up and management.  Consultants:   Nephrology  Cardiology  Procedures:   Echocardiogram 6/12:  - Left ventricle: The cavity size was normal. Systolic function was   normal. The estimated ejection fraction was in the range of 55%   to 60%. Wall motion was normal; there were no regional wall   motion abnormalities. Doppler parameters are consistent with   abnormal left ventricular relaxation (grade 1 diastolic   dysfunction). - Left atrium: The atrium was mildly dilated.  09/02/2017 Dr. Trula Slade:  #1: Failed left radiocephalic fistula #2:  Left brachiocephalic fistula  Antimicrobials:  None   Subjective: Leg swelling stable today. Denies dyspnea, chest pain. Left arm pain surrounding incision sites is severe and not relieved by tylenol. Denies any bleeding.   Objective: BP (!)  130/57 (BP Location: Right Arm)   Pulse 87   Temp 98.2 F (36.8 C) (Oral)   Resp 20   Ht 5\' 4"  (1.626 m)   Wt 96.6 kg (212 lb 15.4 oz)   SpO2 99%   BMI 36.56 kg/m    Gen: Obese chronically ill-appearing female in no distress Pulm: Non-labored breathing. Clear CV: Regular rate and rhythm. No murmur, rub, or gallop. No JVD, 1+ edema in legs stable from prior exam. GI: Abdomen soft, non-tender, non-distended, with normoactive bowel sounds. No organomegaly or masses felt. Ext: Warm, no deformities Skin: No rashes, lesions, ulcers. No bleeding noted. Neuro: Alert and oriented. No focal neurological deficits. Psych: Judgement and insight appear normal. Mood & affect appropriate.   Data Reviewed: I have personally reviewed following labs and imaging studies  CBC: Recent Labs  Lab 08/31/17 1343 08/31/17 1352 08/31/17 2330 09/01/17 0747 09/02/17 0628 09/03/17 0534  WBC 14.0*  --  13.8*  --  11.3* 8.4  NEUTROABS  --   --  9.5*  --   --   --   HGB 7.7* 8.5* 6.8* 7.8* 7.3* 7.2*  HCT 26.5* 25.0* 23.0* 25.9* 24.5* 24.0*  MCV 74.0*  --  73.2*  --  75.9* 75.2*  PLT 403*  --  363  --  350 993   Basic Metabolic Panel: Recent Labs  Lab 08/31/17 1343 08/31/17 1352 08/31/17 2009 09/01/17 0542 09/02/17 0628 09/03/17 0534  NA 145 144  --  143 143 141  K 3.7 3.7  --  4.2 4.2 4.0  CL 107 106  --  109 102 104  CO2 26  --   --  24 27 26   GLUCOSE 202* 208*  --  78 315* 278*  BUN 42* 42*  --  45* 50* 54*  CREATININE 4.32* 4.00*  --  4.45* 4.61* 4.63*  CALCIUM 8.8*  --   --  8.3* 8.6* 8.5*  PHOS  --   --  4.4  --   --   --    GFR: Estimated Creatinine Clearance: 14.4 mL/min (A) (by C-G formula based on SCr of 4.63 mg/dL (H)). Liver Function Tests: Recent Labs  Lab 08/31/17 1343  AST 16  ALT 18  ALKPHOS 94  BILITOT 0.5  PROT 6.3*  ALBUMIN 2.0*   No results for input(s): LIPASE, AMYLASE in the last 168 hours. No results for input(s): AMMONIA in the last 168 hours. Coagulation  Profile: Recent Labs  Lab 09/02/17 0628  INR 1.17   Cardiac Enzymes: No results for input(s): CKTOTAL, CKMB, CKMBINDEX, TROPONINI in the last 168 hours. BNP (last 3 results) No results for input(s): PROBNP in the last 8760 hours. HbA1C: No results for input(s): HGBA1C in the last 72 hours. CBG: Recent Labs  Lab 09/02/17 1503 09/02/17 1647 09/02/17 2127 09/03/17 0748 09/03/17 1150  GLUCAP 234* 274* 301* 276* 358*   Lipid Profile: No results for input(s): CHOL, HDL, LDLCALC, TRIG, CHOLHDL, LDLDIRECT in the last 72 hours. Thyroid Function Tests: No results for input(s): TSH, T4TOTAL, FREET4, T3FREE, THYROIDAB in the last 72 hours. Anemia Panel: Recent Labs    09/01/17 0542  FERRITIN 288  TIBC 158*  IRON 7*   Urine analysis:    Component Value Date/Time  COLORURINE YELLOW 08/31/2017 1356   APPEARANCEUR HAZY (A) 08/31/2017 1356   LABSPEC 1.016 08/31/2017 1356   PHURINE 5.0 08/31/2017 1356   GLUCOSEU >=500 (A) 08/31/2017 1356   HGBUR SMALL (A) 08/31/2017 1356   BILIRUBINUR NEGATIVE 08/31/2017 1356   BILIRUBINUR neg 02/15/2017 0913   KETONESUR NEGATIVE 08/31/2017 1356   PROTEINUR >=300 (A) 08/31/2017 1356   UROBILINOGEN 0.2 02/15/2017 0913   NITRITE NEGATIVE 08/31/2017 1356   LEUKOCYTESUR NEGATIVE 08/31/2017 1356   Recent Results (from the past 240 hour(s))  Surgical pcr screen     Status: None   Collection Time: 09/01/17 11:05 PM  Result Value Ref Range Status   MRSA, PCR NEGATIVE NEGATIVE Final   Staphylococcus aureus NEGATIVE NEGATIVE Final    Comment: (NOTE) The Xpert SA Assay (FDA approved for NASAL specimens in patients 4 years of age and older), is one component of a comprehensive surveillance program. It is not intended to diagnose infection nor to guide or monitor treatment. Performed at [facility], [place] [address], [place], [place] [address]       Radiology Studies: No results found.  Scheduled Meds: . aspirin EC  81 mg Oral Daily  .  atorvastatin  20 mg Oral Daily  . calcitRIOL  0.25 mcg Oral Q M,W,F  . cloNIDine  0.1 mg Oral Q24H  . darbepoetin (ARANESP) injection - NON-DIALYSIS  60 mcg Subcutaneous Q Fri-1800  . enoxaparin (LOVENOX) injection  30 mg Subcutaneous Q24H  . ezetimibe  10 mg Oral Daily  . gabapentin  100 mg Oral TID  . insulin aspart  0-15 Units Subcutaneous TID WC  . insulin aspart  0-5 Units Subcutaneous QHS  . insulin aspart  10 Units Subcutaneous TID WC  . insulin detemir  40 Units Subcutaneous Daily  . isosorbide mononitrate  30 mg Oral Daily  . labetalol  400 mg Oral BID  . pantoprazole  40 mg Oral Daily  . sodium chloride flush  3 mL Intravenous Q12H   Continuous Infusions: . sodium chloride    . sodium chloride Stopped (08/31/17 1908)  . sodium chloride    . sodium chloride 10 mL/hr at 09/02/17 1020  . [START ON 09/04/2017] ferumoxytol    . furosemide       LOS: 3 days   Time spent: 25 minutes.  Patrecia Pour, MD [facility] [email or web address] Password [email or web address] 09/03/2017, 1:46 PM

## 2017-09-03 NOTE — Progress Notes (Addendum)
[facility]  Subjective  - doing well without pain, weakness, or numbness.   Objective 128/65 81 97.6 F (36.4 C) (Oral) 20 95%  Intake/Output Summary (Last 24 hours) at 09/03/2017 0806 Last data filed at 09/03/2017 0557 Gross per 24 hour  Intake 1073 ml  Output 1375 ml  Net -302 ml    Left UE with well healing incision, palpable thrill in fistula Palpable radial pulse, sensation intact with grip 5/5  Assessment/Planning: POD # 1 left AV fistula creation   Stable disposition Plan scheduled f/u in 6 weeks for fistula duplex.  Do not stick for at least 12 weeks.   Roxy Horseman 09/03/2017 8:06 AM --  Laboratory Lab Results: Recent Labs    09/02/17 0628 09/03/17 0534  WBC 11.3* 8.4  HGB 7.3* 7.2*  HCT 24.5* 24.0*  PLT 350 353   BMET Recent Labs    09/02/17 0628 09/03/17 0534  NA 143 141  K 4.2 4.0  CL 102 104  CO2 27 26  GLUCOSE 315* 278*  BUN 50* 54*  CREATININE 4.61* 4.63*  CALCIUM 8.6* 8.5*    COAG Lab Results  Component Value Date   INR 1.17 09/02/2017   No results found for: PTT   I have independently interviewed and examined patient and agree with PA assessment and plan above.   Shuntavia Yerby C. Donzetta Matters, MD [facility] Office: [phone] Pager: [phone]

## 2017-09-03 NOTE — Progress Notes (Signed)
Patient c/o surgical pain in left arm at fistula insertion site and requested stronger pain med other than Acetaminophen.  DR. Bonner Puna informed via text page and order for Dilaudid 1mg  every 4 hours given.t dose of prn Dilaudid given.  Patient instructed she needs to inform staff when she needs pain med since it is ordered prn.  Patient stated understanding.

## 2017-09-03 NOTE — Progress Notes (Signed)
Inpatient Diabetes Program Recommendations  AACE/ADA: New Consensus Statement on Inpatient Glycemic Control (2015)  Target Ranges:  Prepandial:   less than 140 mg/dL      Peak postprandial:   less than 180 mg/dL (1-2 hours)      Critically ill patients:  140 - 180 mg/dL   Results for JARRAH, SEHER (MRN [id number]) as of 09/03/2017 12:37  Ref. Range 09/02/2017 07:19 09/02/2017 09:27 09/02/2017 10:17 09/02/2017 12:26 09/02/2017 13:30 09/02/2017 15:03 09/02/2017 16:47 09/02/2017 21:27  Glucose-Capillary Latest Ref Range: 65 - 99 mg/dL 312 (H) 315 (H)  10 units NOVOLOG  283 (H)  15 units LEVEMIR 258 (H)  10 units NOVOLOG  232 (H) 234 (H) 274 (H)  8 units NOVOLOG  301 (H)  4 units NOVOLOG    Results for MARIKO, NOWAKOWSKI (MRN [id number]) as of 09/03/2017 12:37  Ref. Range 09/03/2017 07:48 09/03/2017 11:50  Glucose-Capillary Latest Ref Range: 65 - 99 mg/dL 276 (H)  8 units NOVOLOG +  40 units LEVEMIR at 10am  358 (H)  20 units NOVOLOG     Home DM Meds: Levemir 100 units daily       Humalog 32-36 units TID with meals  Current Insulin Orders: Levemir 40 units daily      Novolog Moderate Correction Scale/ SSI (0-15 units) TID AC + HS      Novolog 5 units TID with meals      Last saw her Endocrinologist Dr. [name]) on 06/18/2017.  At that visit, patient was instructed to take: Toujeo 50 units BID Humalog 32 units with small meals Humalog 36 units with large meals  Has Follow-up visit with Dr. Cruzita Lederer scheduled for 09/30/2017.    MD- Note that patient takes significantly more insulin at home.  May consider the following adjustments if CBGs remain elevated despite adjustments made today:  1. Increase Levemir to 40 units BID (80% total home dose)  2. Increase Novolog Meal Coverage to: Novolog 10 units TID with meals (hold if pt eats <50% of meal) This would be approximately 1/3 total home dose of rapid-acting insulin      --Will follow  patient during hospitalization--  Wyn Quaker RN, MSN, CDE Diabetes Coordinator Inpatient Glycemic Control Team Team Pager: [phone] (8a-5p)

## 2017-09-04 ENCOUNTER — Encounter: Payer: Self-pay | Admitting: Family Medicine

## 2017-09-04 LAB — BASIC METABOLIC PANEL
ANION GAP: 9 (ref 5–15)
BUN: 59 mg/dL — AB (ref 6–20)
CALCIUM: 8.6 mg/dL — AB (ref 8.9–10.3)
CO2: 27 mmol/L (ref 22–32)
Chloride: 105 mmol/L (ref 101–111)
Creatinine, Ser: 4.22 mg/dL — ABNORMAL HIGH (ref 0.44–1.00)
GFR calc Af Amer: 12 mL/min — ABNORMAL LOW (ref >60)
GFR calc non Af Amer: 10 mL/min — ABNORMAL LOW (ref >60)
GLUCOSE: 323 mg/dL — AB (ref 65–99)
Potassium: 4 mmol/L (ref 3.5–5.1)
SODIUM: 141 mmol/L (ref 135–145)

## 2017-09-04 LAB — GLUCOSE, CAPILLARY
GLUCOSE-CAPILLARY: 292 mg/dL — AB (ref 65–99)
GLUCOSE-CAPILLARY: 298 mg/dL — AB (ref 65–99)
GLUCOSE-CAPILLARY: 194 mg/dL — AB (ref 65–99)
Glucose-Capillary: 232 mg/dL — ABNORMAL HIGH (ref 65–99)

## 2017-09-04 MED ORDER — INSULIN DETEMIR 100 UNIT/ML ~~LOC~~ SOLN
50.0000 [IU] | Freq: Two times a day (BID) | SUBCUTANEOUS | Status: DC
  Administered 2017-09-05 – 2017-09-06 (×3): 50 [IU] via SUBCUTANEOUS
  Filled 2017-09-04 (×3): qty 0.5

## 2017-09-04 MED ORDER — TRAMADOL HCL 50 MG PO TABS
50.0000 mg | ORAL_TABLET | Freq: Two times a day (BID) | ORAL | Status: DC | PRN
  Administered 2017-09-05 – 2017-09-08 (×5): 50 mg via ORAL
  Filled 2017-09-04 (×6): qty 1

## 2017-09-04 MED ORDER — INSULIN DETEMIR 100 UNIT/ML ~~LOC~~ SOLN
60.0000 [IU] | Freq: Every day | SUBCUTANEOUS | Status: DC
  Administered 2017-09-04: 60 [IU] via SUBCUTANEOUS
  Filled 2017-09-04: qty 0.6

## 2017-09-04 MED ORDER — INSULIN ASPART 100 UNIT/ML ~~LOC~~ SOLN
15.0000 [IU] | Freq: Three times a day (TID) | SUBCUTANEOUS | Status: DC
  Administered 2017-09-04 – 2017-09-07 (×12): 15 [IU] via SUBCUTANEOUS

## 2017-09-04 MED ORDER — INSULIN DETEMIR 100 UNIT/ML ~~LOC~~ SOLN
40.0000 [IU] | Freq: Every day | SUBCUTANEOUS | Status: AC
  Administered 2017-09-04: 40 [IU] via SUBCUTANEOUS
  Filled 2017-09-04 (×2): qty 0.4

## 2017-09-04 NOTE — Progress Notes (Signed)
Physical Therapy Treatment Patient Details Name: Kathryn Beck MRN: [id number] DOB: [birth year] Today's Date: 09/04/2017    History of Present Illness 62 y.o. female admitted 6/11 with SOB and BLE swelling. PMH includes: HTN, Diastolic CHF, DM2, Anemia, CKD, Foot surgery.     PT Comments    Patient with cont slow yet consistent progression. Pt now supervision level with standing, performed x5 sit to stands, encouraged continued mobility with nursing staff. Able to take first few steps in days with min guard. Still limited by pain and weakness in BLE, improving.  Will cont to follow and progress as tolerated.    Follow Up Recommendations  Home health PT;Supervision/Assistance - 24 hour     Equipment Recommendations  (TBD)    Recommendations for Other Services OT consult     Precautions / Restrictions Precautions Precautions: Fall Restrictions Weight Bearing Restrictions: No    Mobility  Bed Mobility               General bed mobility comments: in chair at entry  Transfers Overall transfer level: Needs assistance Equipment used: Rolling walker (2 wheeled) Transfers: Sit to/from Stand Sit to Stand: Supervision         General transfer comment: Pt with increased time and effort but able to sit<>stand from lower surface today without physical assistance  Ambulation/Gait                 Stairs             Wheelchair Mobility    Modified Rankin (Stroke Patients Only)       Balance                                            Cognition Arousal/Alertness: Awake/alert Behavior During Therapy: WFL for tasks assessed/performed Overall Cognitive Status: Within Functional Limits for tasks assessed                                        Exercises      General Comments        Pertinent Vitals/Pain Pain Assessment: Faces Faces Pain Scale: Hurts a little bit Pain Location: BLE Pain Descriptors /  Indicators: Aching;Throbbing Pain Intervention(s): Limited activity within patient's tolerance;Monitored during session;Premedicated before session    Home Living                      Prior Function            PT Goals (current goals can now be found in the care plan section) Acute Rehab PT Goals Patient Stated Goal: reduce pain, return home with husband care PT Goal Formulation: With patient Time For Goal Achievement: 09/08/17 Potential to Achieve Goals: Fair Progress towards PT goals: Progressing toward goals    Frequency    Min 3X/week      PT Plan Current plan remains appropriate    Co-evaluation              AM-PAC PT "6 Clicks" Daily Activity  Outcome Measure  Difficulty turning over in bed (including adjusting bedclothes, sheets and blankets)?: A Little Difficulty moving from lying on back to sitting on the side of the bed? : A Little Difficulty sitting down on and standing up from a chair with  arms (e.g., wheelchair, bedside commode, etc,.)?: A Little Help needed moving to and from a bed to chair (including a wheelchair)?: A Little Help needed walking in hospital room?: A Lot Help needed climbing 3-5 steps with a railing? : Total 6 Click Score: 15    End of Session Equipment Utilized During Treatment: Gait belt Activity Tolerance: Patient limited by pain Patient left: in bed;with call bell/phone within reach;with nursing/sitter in room Nurse Communication: Mobility status PT Visit Diagnosis: Pain;Muscle weakness (generalized) (M62.81) Pain - part of body: Leg     Time: 1140-1200 PT Time Calculation (min) (ACUTE ONLY): 20 min  Charges:  $Therapeutic Activity: 8-22 mins                    G Codes:      Reinaldo Berber, PT, DPT Acute Rehab Services Pager: [phone]     Reinaldo Berber 09/04/2017, 12:32 PM

## 2017-09-04 NOTE — Progress Notes (Signed)
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  Subjective:   Feels better, able to stand up some but not ambulating very well  Swelling definitely improved in the legs  Weights really have not shown a downtrend, net In-N-Out has shown that negative more than 3 L  Stable renal function this morning  1.8 L urine output yesterday  Saw PT yesterday   06/14 0701 - 06/15 0700 In: 1322 [P.O.:1080; IV Piggyback:242] Out: 1750 [WOEHO:1224]  Filed Weights   09/02/17 0920 09/03/17 0442 09/04/17 0518  Weight: 94.3 kg (208 lb) 96.6 kg (212 lb 15.4 oz) 96.2 kg (212 lb)    Scheduled Meds: . aspirin EC  81 mg Oral Daily  . atorvastatin  20 mg Oral Daily  . calcitRIOL  0.25 mcg Oral Q M,W,F  . cloNIDine  0.1 mg Oral Q24H  . darbepoetin (ARANESP) injection - NON-DIALYSIS  60 mcg Subcutaneous Q Fri-1800  . enoxaparin (LOVENOX) injection  30 mg Subcutaneous Q24H  . ezetimibe  10 mg Oral Daily  . gabapentin  100 mg Oral TID  . insulin aspart  0-15 Units Subcutaneous TID WC  . insulin aspart  0-5 Units Subcutaneous QHS  . insulin aspart  15 Units Subcutaneous TID WC  . insulin detemir  60 Units Subcutaneous Daily  . isosorbide mononitrate  30 mg Oral Daily  . labetalol  400 mg Oral BID  . pantoprazole  40 mg Oral Daily  . sodium chloride flush  3 mL Intravenous Q12H   Continuous Infusions: . sodium chloride    . sodium chloride Stopped (08/31/17 1908)  . sodium chloride    . sodium chloride 10 mL/hr at 09/02/17 1020  . furosemide Stopped (09/04/17 0753)   PRN Meds:.sodium chloride, acetaminophen, albuterol, calcium carbonate (dosed in mg elemental calcium), camphor-menthol **AND** hydrOXYzine, docusate sodium, feeding supplement (NEPRO CARB STEADY), HYDROmorphone, methocarbamol, ondansetron (ZOFRAN) IV, sodium chloride flush, sorbitol, zolpidem  Current Labs: reviewed  Results for Kathryn, Beck (MRN  [id number]) as of 09/01/2017 14:46  Ref. Range 09/01/2017 05:42  Saturation Ratios Latest Ref Range: 10.4 - 31.8 % 4 (L)  Ferritin Latest Ref Range: 11 - 307 ng/mL 288   Results for Kathryn, Beck (MRN [id number]) as of 09/03/2017 09:00  Ref. Range 08/31/2017 22:57  Protein Creatinine Ratio Latest Ref Range: 0.00 - 0.15 mg/mgCre 8.69 (H)   Physical Exam:  Blood pressure (!) 120/57, pulse 68, temperature (!) 97.5 F (36.4 C), temperature source Oral, resp. rate 20, height 5\' 4"  (1.626 m), weight 96.2 kg (212 lb), SpO2 98 %. GEN: chronically ill appearing, NAD ENT: Poor dentition NAD EYES: EOMI CV: RRR no rub, nl s1s2 PULM: diminished in bases with crackles, clear in upper lobes ABD: s/nt/nd SKIN: no rashes/lesions EXT:2+ LEE, pitting into knees -- improved this AM LUE AVF, +B/T  A 1. 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 Hypervoelmia / LEE / SOB / Pulm Edema likely primarily from #1;  3. Anemia, microcytic, was told had IDA - s/p 510mg  IV Madilyn Hook 09/01/17; starrt ESA 6/14 4. HTN on CCB and clonidine daily 5. Recent CAP of LLL s/p levofloxacin 6. ? 2HPTH, on C3; PTH 128, P WNL 7. GERD 8. S/p LUE BC AVF 09/02/17 9. ? M Spike on UPEP at outpt [facility] eval, repeat SPEP and sFLC pending  P 1. Cont lasix as she is clinically much improved 2. Essentially transition to oral diuretics tomorrow, would start  torsemide 100 mg each morning 3. Will need Fereheme 2nd dose today 4. PT 5. No immediate dialysis needs, patient with strong preference for peritoneal dialysis, will need close follow-up with outpatient nephrology to make this happen; she wishes to cont nephrology f/u with me/CK[facility] 6. Have begun referral to HT  7. Daily weights, Daily Renal Panel, Strict I/Os, Avoid nephrotoxins (NSAIDs, judicious IV Contrast)  8. Medication Issues;  Preferred narcotic agents for pain control are hydromorphone, fentanyl, and methadone. Morphine should not be used. Oxydone is not  preferred.   Baclofen should not be used.  Avoid Fleets and Magnesium Citrate Bowel Preps    Pearson Grippe MD 09/04/2017, 12:29 PM  Recent Labs  Lab 08/31/17 2009  09/02/17 7473 09/03/17 0534 09/04/17 0800  NA  --    < > 143 141 141  K  --    < > 4.2 4.0 4.0  CL  --    < > 102 104 105  CO2  --    < > 27 26 27   GLUCOSE  --    < > 315* 278* 323*  BUN  --    < > 50* 54* 59*  CREATININE  --    < > 4.61* 4.63* 4.22*  CALCIUM  --    < > 8.6* 8.5* 8.6*  PHOS 4.4  --   --   --   --    < > = values in this interval not displayed.   Recent Labs  Lab 08/31/17 2330 09/01/17 0747 09/02/17 0628 09/03/17 0534  WBC 13.8*  --  11.3* 8.4  NEUTROABS 9.5*  --   --   --   HGB 6.8* 7.8* 7.3* 7.2*  HCT 23.0* 25.9* 24.5* 24.0*  MCV 73.2*  --  75.9* 75.2*  PLT 363  --  350 353

## 2017-09-04 NOTE — Progress Notes (Signed)
RT NOTE:  Pt refuses to wear CPAP. Pt states she does not wear @ home.

## 2017-09-04 NOTE — Plan of Care (Signed)
  Problem: Cardiac: Goal: Ability to achieve and maintain adequate cardiopulmonary perfusion will improve Outcome: Completed/Met

## 2017-09-04 NOTE — Assessment & Plan Note (Signed)
Elevated at visit, however pt reports that she has not taken medication on the day of the isit, no adjustment made in her medication

## 2017-09-04 NOTE — Progress Notes (Signed)
PROGRESS NOTE  Kathryn Beck  ALP:[id number] DOB: Jun 29, 1955 DOA: 08/31/2017 PCP: Fayrene Helper, MD   Brief Narrative: Kathryn Beck is a 62 y.o. female with medical history significant of T2DM, Stage IV CKD, chronic HFpEF, HTN, HLD, OSA not compliant with CPAP, morbid obesity whot presented to the ED for dyspnea and leg swelling for the past 2 weeks following recent admission for pneumonia (got IVF's, discharged 5/31). She was evaluated by her Nephrologist (Dr. Lowanda Foster) on 08/25/17 and her po lasix was increased to 120mg  qAM and 80mg  qPM. She states her weight has remained stable if not slightly decreased since that visit (218lbs this AM), however her LE edema has progressed, making it difficult to ambulate. Evaluation showed creatinine 4, having steadily increased. Nephrology was consulted and after discussion with patient the decision was made to initiate dialysis. LUE AVF was placed 6/13 as back up to the patient's preferred method, peritoneal dialysis. Outpatient nephrology reported an M spike in urine, so SPEP and light chains were sent and pending. Lasix has been ineffective in diuresis so dose is increased by nephrology.  Assessment & Plan: Principal Problem:   CKD (chronic kidney disease) stage 5, GFR less than 15 ml/min (HCC) Active Problems:   Uncontrolled diabetes mellitus with diabetic nephropathy, with long-term current use of insulin (HCC)   HLD (hyperlipidemia)   Sleep apnea   Anemia of chronic disease   Acute exacerbation of CHF (congestive heart failure) (HCC)   Edema due to hypoalbuminemia  Progressive CKD stage V: Suspected diabetic nephropathy with nephrotic-range proteinuria.  - Left brachiocephalic fistula placed 3/53/2992 by Dr. Trula Slade for back up, as pt wishes to have peritoneal dialysis primarily. Will give tylenol and deescalate to tramadol prn severe pain - Will follow up with Dr. Joelyn Oms as outpatient. No urgent HD needs, Cr stabilizing. Diuresis per  nephrology as below. - Hold NSAIDs, ARB.  - SPEP, kappa/lambda light chains sent to further evaluate M spike on urine as outpatient. Note Ca is low, not elevated.   Acute on chronic diastolic CHF: Last DC weight 208lbs. Echo 6/12 w/EF 55-60%, G1DD, no WMA's. - Wt stable but edema significantly improving. Per nephrology, continue IV diuresis with hope to transition to torsemide 100mg  daily 6/16.  T2DM: HbA1c 11% (poor control). Initially insulin held due to hypoglycemia, though patient and family report high sugars as outpatient.  - Increasing levemir to 60u this AM, will give 40u this PM. Increased mealtime insulin and continuing SSI.   Iron deficiency anemia and anemia of chronic renal disease:  - Given 1u PRBCs, up to 7.8g/dl and slowly trending downward without bleeding.  - Given feraheme x2, aranesp qFriday   HTN: At goal. - Continue clonidine, labetalol.   HLD:  - Continue statin, zetia  DVT prophylaxis: Lovenox Code Status: Full Family Communication: Sister and son at bedside this AM Disposition Plan: Home when stable on po regimen, possibly 6/16 or 6/17.  Consultants:   Nephrology  Cardiology  Procedures:   Echocardiogram 6/12:  - Left ventricle: The cavity size was normal. Systolic function was   normal. The estimated ejection fraction was in the range of 55%   to 60%. Wall motion was normal; there were no regional wall   motion abnormalities. Doppler parameters are consistent with   abnormal left ventricular relaxation (grade 1 diastolic   dysfunction). - Left atrium: The atrium was mildly dilated.  09/02/2017 Dr. Trula Slade:  #1: Failed left radiocephalic fistula #2: Left brachiocephalic fistula  Antimicrobials:  None  Subjective: Leg swelling has improved. Got up with PT to the chair, stood for 10 minutes but did not feel steady enough to walk. No dyspnea. Pain in left arm described as soreness.   Objective: BP (!) 120/57 (BP Location: Left Arm)   Pulse  68   Temp (!) 97.5 F (36.4 C) (Oral)   Resp 20   Ht 5\' 4"  (1.626 m)   Wt 96.2 kg (212 lb)   SpO2 98%   BMI 36.39 kg/m    Gen: Pleasant, chronically ill-appearing female in no distress Pulm: Nonlabored breathing room air. Clear without crackles. CV: Regular rate and rhythm. No murmur, rub, or gallop. No JVD, trace LE pitting dependent edema. GI: Abdomen soft, non-tender, non-distended, with normoactive bowel sounds.  Ext: Left arm with incision sites for RC AVF attempt c/d/i with appropriate mild tenderness to palpation, no erythema or induration. BC site c/d/i without significant erythema or swelling, +thrill proximally.  Skin: As above.  Neuro: Alert and oriented. No focal neurological deficits. Psych: Judgement and insight appear fair. Mood euthymic & affect congruent. Behavior is appropriate.    Data Reviewed: I have personally reviewed following labs and imaging studies  CBC: Recent Labs  Lab 08/31/17 1343 08/31/17 1352 08/31/17 2330 09/01/17 0747 09/02/17 0628 09/03/17 0534  WBC 14.0*  --  13.8*  --  11.3* 8.4  NEUTROABS  --   --  9.5*  --   --   --   HGB 7.7* 8.5* 6.8* 7.8* 7.3* 7.2*  HCT 26.5* 25.0* 23.0* 25.9* 24.5* 24.0*  MCV 74.0*  --  73.2*  --  75.9* 75.2*  PLT 403*  --  363  --  350 536   Basic Metabolic Panel: Recent Labs  Lab 08/31/17 1343 08/31/17 1352 08/31/17 2009 09/01/17 0542 09/02/17 0628 09/03/17 0534 09/04/17 0800  NA 145 144  --  143 143 141 141  K 3.7 3.7  --  4.2 4.2 4.0 4.0  CL 107 106  --  109 102 104 105  CO2 26  --   --  24 27 26 27   GLUCOSE 202* 208*  --  78 315* 278* 323*  BUN 42* 42*  --  45* 50* 54* 59*  CREATININE 4.32* 4.00*  --  4.45* 4.61* 4.63* 4.22*  CALCIUM 8.8*  --   --  8.3* 8.6* 8.5* 8.6*  PHOS  --   --  4.4  --   --   --   --    GFR: Estimated Creatinine Clearance: 15.8 mL/min (A) (by C-G formula based on SCr of 4.22 mg/dL (H)). Liver Function Tests: Recent Labs  Lab 08/31/17 1343  AST 16  ALT 18  ALKPHOS  94  BILITOT 0.5  PROT 6.3*  ALBUMIN 2.0*   No results for input(s): LIPASE, AMYLASE in the last 168 hours. No results for input(s): AMMONIA in the last 168 hours. Coagulation Profile: Recent Labs  Lab 09/02/17 0628  INR 1.17   Cardiac Enzymes: No results for input(s): CKTOTAL, CKMB, CKMBINDEX, TROPONINI in the last 168 hours. BNP (last 3 results) No results for input(s): PROBNP in the last 8760 hours. HbA1C: No results for input(s): HGBA1C in the last 72 hours. CBG: Recent Labs  Lab 09/03/17 1632 09/03/17 2140 09/03/17 2248 09/04/17 0747 09/04/17 1150  GLUCAP 239* 401* 335* 292* 298*   Lipid Profile: No results for input(s): CHOL, HDL, LDLCALC, TRIG, CHOLHDL, LDLDIRECT in the last 72 hours. Thyroid Function Tests: No results for input(s): TSH, T4TOTAL, FREET4,  T3FREE, THYROIDAB in the last 72 hours. Anemia Panel: No results for input(s): VITAMINB12, FOLATE, FERRITIN, TIBC, IRON, RETICCTPCT in the last 72 hours. Urine analysis:    Component Value Date/Time   COLORURINE YELLOW 08/31/2017 1356   APPEARANCEUR HAZY (A) 08/31/2017 1356   LABSPEC 1.016 08/31/2017 1356   PHURINE 5.0 08/31/2017 1356   GLUCOSEU >=500 (A) 08/31/2017 1356   HGBUR SMALL (A) 08/31/2017 1356   BILIRUBINUR NEGATIVE 08/31/2017 1356   BILIRUBINUR neg 02/15/2017 0913   KETONESUR NEGATIVE 08/31/2017 1356   PROTEINUR >=300 (A) 08/31/2017 1356   UROBILINOGEN 0.2 02/15/2017 0913   NITRITE NEGATIVE 08/31/2017 1356   LEUKOCYTESUR NEGATIVE 08/31/2017 1356   Recent Results (from the past 240 hour(s))  Surgical pcr screen     Status: None   Collection Time: 09/01/17 11:05 PM  Result Value Ref Range Status   MRSA, PCR NEGATIVE NEGATIVE Final   Staphylococcus aureus NEGATIVE NEGATIVE Final    Comment: (NOTE) The Xpert SA Assay (FDA approved for NASAL specimens in patients 43 years of age and older), is one component of a comprehensive surveillance program. It is not intended to diagnose infection nor  to guide or monitor treatment. Performed at [facility], [address] [place], [place] [address]       Radiology Studies: No results found.  Scheduled Meds: . aspirin EC  81 mg Oral Daily  . atorvastatin  20 mg Oral Daily  . calcitRIOL  0.25 mcg Oral Q M,W,F  . cloNIDine  0.1 mg Oral Q24H  . darbepoetin (ARANESP) injection - NON-DIALYSIS  60 mcg Subcutaneous Q Fri-1800  . enoxaparin (LOVENOX) injection  30 mg Subcutaneous Q24H  . ezetimibe  10 mg Oral Daily  . gabapentin  100 mg Oral TID  . insulin aspart  0-15 Units Subcutaneous TID WC  . insulin aspart  0-5 Units Subcutaneous QHS  . insulin aspart  15 Units Subcutaneous TID WC  . insulin detemir  60 Units Subcutaneous Daily  . isosorbide mononitrate  30 mg Oral Daily  . labetalol  400 mg Oral BID  . pantoprazole  40 mg Oral Daily  . sodium chloride flush  3 mL Intravenous Q12H   Continuous Infusions: . sodium chloride    . sodium chloride Stopped (08/31/17 1908)  . sodium chloride    . sodium chloride 10 mL/hr at 09/02/17 1020  . furosemide 160 mg (09/04/17 1336)     LOS: 4 days   Time spent: 25 minutes.  Patrecia Pour, MD [facility] [email or web address] Password [facility] 09/04/2017, 3:33 PM

## 2017-09-05 LAB — BASIC METABOLIC PANEL
ANION GAP: 11 (ref 5–15)
BUN: 59 mg/dL — ABNORMAL HIGH (ref 6–20)
CALCIUM: 9.1 mg/dL (ref 8.9–10.3)
CO2: 28 mmol/L (ref 22–32)
Chloride: 105 mmol/L (ref 101–111)
Creatinine, Ser: 4.22 mg/dL — ABNORMAL HIGH (ref 0.44–1.00)
GFR, EST AFRICAN AMERICAN: 12 mL/min — AB (ref >60)
GFR, EST NON AFRICAN AMERICAN: 10 mL/min — AB (ref >60)
Glucose, Bld: 171 mg/dL — ABNORMAL HIGH (ref 65–99)
Potassium: 3.7 mmol/L (ref 3.5–5.1)
Sodium: 144 mmol/L (ref 135–145)

## 2017-09-05 LAB — GLUCOSE, CAPILLARY
GLUCOSE-CAPILLARY: 178 mg/dL — AB (ref 65–99)
GLUCOSE-CAPILLARY: 198 mg/dL — AB (ref 65–99)
GLUCOSE-CAPILLARY: 165 mg/dL — AB (ref 65–99)
GLUCOSE-CAPILLARY: 122 mg/dL — AB (ref 65–99)

## 2017-09-05 LAB — CBC
HCT: 25.6 % — ABNORMAL LOW (ref 36.0–46.0)
HEMOGLOBIN: 7.5 g/dL — AB (ref 12.0–15.0)
MCH: 22.2 pg — ABNORMAL LOW (ref 26.0–34.0)
MCHC: 29.3 g/dL — ABNORMAL LOW (ref 30.0–36.0)
MCV: 75.7 fL — ABNORMAL LOW (ref 78.0–100.0)
Platelets: 426 10*3/uL — ABNORMAL HIGH (ref 150–400)
RBC: 3.38 MIL/uL — AB (ref 3.87–5.11)
RDW: 16.6 % — ABNORMAL HIGH (ref 11.5–15.5)
WBC: 8.9 10*3/uL (ref 4.0–10.5)

## 2017-09-05 MED ORDER — METOLAZONE 5 MG PO TABS
5.0000 mg | ORAL_TABLET | Freq: Once | ORAL | Status: AC
  Administered 2017-09-05: 5 mg via ORAL
  Filled 2017-09-05: qty 1

## 2017-09-05 NOTE — Progress Notes (Signed)
RT Note :  Patient refused CPAP. Does not use at home.

## 2017-09-05 NOTE — Progress Notes (Signed)
Physical Therapy Treatment Patient Details Name: Kathryn Beck MRN: [id number] DOB: 24-Apr-1955 Today's Date: 09/05/2017    History of Present Illness 62 y.o. female admitted 6/11 with SOB and BLE swelling. PMH includes: HTN, Diastolic CHF, DM2, Anemia, CKD, Foot surgery.     PT Comments    Patient with slow progress with therapy. Continues to be limited by pain and stiffness in legs with increased time and effort requiring hands on assistance. Ambulating several steps to bathroom in hospital room only much more fatigued from short bouts of activity than her baseline, unable to safely progress to stair training for home entry at this time. Family present, discussed concerns and level of physical assistance needed to prevent falling. Family reports they are comfortable with guarding. Still believe over next several days patient can progress well enough to get home, if not may need SNF for reconditioning which she does not want. Will continue to assess this week prior to D/C.    Follow Up Recommendations  Home health PT;Supervision/Assistance - 24 hour     Equipment Recommendations  Rolling walker with 5" wheels    Recommendations for Other Services OT consult     Precautions / Restrictions Precautions Precautions: Fall Restrictions Weight Bearing Restrictions: No    Mobility  Bed Mobility Overal bed mobility: Needs Assistance Bed Mobility: Supine to Sit     Supine to sit: Supervision        Transfers Overall transfer level: Needs assistance Equipment used: Rolling walker (2 wheeled) Transfers: Sit to/from Stand Sit to Stand: Supervision         General transfer comment: patient with more effort than prior visits, reports she is tired from going to bathroom earlier   Ambulation/Gait Ambulation/Gait assistance: Min guard Gait Distance (Feet): 10 Feet Assistive device: Rolling walker (2 wheeled) Gait Pattern/deviations: Step-to pattern Gait velocity: decreased    General Gait Details: patient ambulating extremly slow and painful with limited mobility due to pain and stiffness. Pt with guarding, no overt LOB but unsafe to do alone at this point.    Stairs             Wheelchair Mobility    Modified Rankin (Stroke Patients Only)       Balance                                            Cognition Arousal/Alertness: Awake/alert Behavior During Therapy: WFL for tasks assessed/performed Overall Cognitive Status: Within Functional Limits for tasks assessed                                        Exercises General Exercises - Lower Extremity Long Arc Quad: 10 reps;Right;Left    General Comments        Pertinent Vitals/Pain Pain Assessment: Faces Faces Pain Scale: Hurts little more Pain Location: BLE Pain Descriptors / Indicators: Aching;Throbbing Pain Intervention(s): Limited activity within patient's tolerance;Monitored during session;Premedicated before session;Repositioned    Home Living                      Prior Function            PT Goals (current goals can now be found in the care plan section) Acute Rehab PT Goals Patient Stated Goal: reduce pain,  return home with husband care PT Goal Formulation: With patient Time For Goal Achievement: 09/08/17 Potential to Achieve Goals: Fair Progress towards PT goals: Progressing toward goals    Frequency    Min 3X/week      PT Plan Current plan remains appropriate    Co-evaluation              AM-PAC PT "6 Clicks" Daily Activity  Outcome Measure  Difficulty turning over in bed (including adjusting bedclothes, sheets and blankets)?: A Little Difficulty moving from lying on back to sitting on the side of the bed? : A Little Difficulty sitting down on and standing up from a chair with arms (e.g., wheelchair, bedside commode, etc,.)?: A Little Help needed moving to and from a bed to chair (including a wheelchair)?: A  Little Help needed walking in hospital room?: A Lot Help needed climbing 3-5 steps with a railing? : Total 6 Click Score: 15    End of Session Equipment Utilized During Treatment: Gait belt Activity Tolerance: Patient limited by pain Patient left: in bed;with call bell/phone within reach;with nursing/sitter in room Nurse Communication: Mobility status PT Visit Diagnosis: Pain;Muscle weakness (generalized) (M62.81) Pain - part of body: Leg     Time: 6116-4353 PT Time Calculation (min) (ACUTE ONLY): 17 min  Charges:  $Gait Training: 8-22 mins                    G Codes:       Reinaldo Berber, PT, DPT Acute Rehab Services Pager: [place] 09/05/2017, 10:22 PM

## 2017-09-05 NOTE — Progress Notes (Signed)
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  Subjective:   Worked with therapy yesterday, able to stand up with quite a bit of pain in her feet  Edema overall is improved but significant pedal edema persists  Weight down 7 pounds from admission  4.2 L net negative since admission  Not a very robust diuresis with high-dose furosemide   06/15 0701 - 06/16 0700 In: 4097 [P.O.:840; I.V.:3; IV Piggyback:299] Out: 1600 [Urine:1600]  Filed Weights   09/03/17 0442 09/04/17 0518 09/05/17 0443  Weight: 96.6 kg (212 lb 15.4 oz) 96.2 kg (212 lb) 92.7 kg (204 lb 4.8 oz)    Scheduled Meds: . aspirin EC  81 mg Oral Daily  . atorvastatin  20 mg Oral Daily  . calcitRIOL  0.25 mcg Oral Q M,W,F  . cloNIDine  0.1 mg Oral Q24H  . darbepoetin (ARANESP) injection - NON-DIALYSIS  60 mcg Subcutaneous Q Fri-1800  . enoxaparin (LOVENOX) injection  30 mg Subcutaneous Q24H  . ezetimibe  10 mg Oral Daily  . gabapentin  100 mg Oral TID  . insulin aspart  0-15 Units Subcutaneous TID WC  . insulin aspart  0-5 Units Subcutaneous QHS  . insulin aspart  15 Units Subcutaneous TID WC  . insulin detemir  50 Units Subcutaneous BID  . isosorbide mononitrate  30 mg Oral Daily  . labetalol  400 mg Oral BID  . metolazone  5 mg Oral Once  . pantoprazole  40 mg Oral Daily  . sodium chloride flush  3 mL Intravenous Q12H   Continuous Infusions: . sodium chloride    . sodium chloride Stopped (08/31/17 1908)  . sodium chloride    . sodium chloride 10 mL/hr at 09/02/17 1020  . furosemide Stopped (09/05/17 0706)   PRN Meds:.sodium chloride, acetaminophen, albuterol, calcium carbonate (dosed in mg elemental calcium), camphor-menthol **AND** hydrOXYzine, docusate sodium, feeding supplement (NEPRO CARB STEADY), methocarbamol, ondansetron (ZOFRAN) IV, sodium chloride flush, sorbitol, traMADol, zolpidem  Current Labs: reviewed  Results for  SAMAIYA, AWADALLAH (MRN [id number]) as of 09/01/2017 14:46  Ref. Range 09/01/2017 05:42  Saturation Ratios Latest Ref Range: 10.4 - 31.8 % 4 (L)  Ferritin Latest Ref Range: 11 - 307 ng/mL 288   Results for VERENISE, MOULIN (MRN [id number]) as of 09/03/2017 09:00  Ref. Range 08/31/2017 22:57  Protein Creatinine Ratio Latest Ref Range: 0.00 - 0.15 mg/mgCre 8.69 (H)   Physical Exam:  Blood pressure (!) 144/75, pulse 74, temperature 98.3 F (36.8 C), temperature source Oral, resp. rate 18, height 5\' 4"  (1.626 m), weight 92.7 kg (204 lb 4.8 oz), SpO2 94 %. GEN: chronically ill appearing, NAD ENT: Poor dentition NAD EYES: EOMI CV: RRR no rub, nl s1s2 PULM: diminished in bases with crackles, clear in upper lobes ABD: s/nt/nd SKIN: no rashes/lesions EXT:1+ LEE, esp pedal edema LUE AVF, +B/T  A 1. 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 Hypervoelmia / LEE / SOB / Pulm Edema likely primarily from #1;  3. Anemia, microcytic, was told had IDA - s/p 510mg  IV Fereheme 09/01/17 + 6/15; started ESA 6/14 4. HTN on CCB and clonidine daily 5. Recent CAP of LLL s/p levofloxacin 6. ? 2HPTH, on C3; PTH 128, P WNL 7. GERD 8. S/p LUE BC AVF 09/02/17 9. ? M Spike on UPEP at outpt [place] eval, repeat SPEP and sFLC pending  P 1. Overall much better than when she presented but still with  edema and suboptimal diuresis; continue Lasix at current dosing but will give single dose of metolazone today 2. Not ready to transition to oral diuretics 3. No immediate dialysis needs, patient with strong preference for peritoneal dialysis, she wishes to follow-up with me upon discharge.  Home therapies is aware of need for rapid home visits and referral for PD catheter placement; I think her best interest is for a transition to PD without delay, she and family are in agreement 4. Daily weights, Daily Renal Panel, Strict I/Os, Avoid nephrotoxins (NSAIDs, judicious IV Contrast)  5. Medication Issues;  Preferred  narcotic agents for pain control are hydromorphone, fentanyl, and methadone. Morphine should not be used. Oxydone is not preferred.   Baclofen should not be used.  Avoid Fleets and Magnesium Citrate Bowel Preps    Pearson Grippe MD 09/05/2017, 9:15 AM  Recent Labs  Lab 08/31/17 2009  09/03/17 0534 09/04/17 0800 09/05/17 0652  NA  --    < > 141 141 144  K  --    < > 4.0 4.0 3.7  CL  --    < > 104 105 105  CO2  --    < > 26 27 28   GLUCOSE  --    < > 278* 323* 171*  BUN  --    < > 54* 59* 59*  CREATININE  --    < > 4.63* 4.22* 4.22*  CALCIUM  --    < > 8.5* 8.6* 9.1  PHOS 4.4  --   --   --   --    < > = values in this interval not displayed.   Recent Labs  Lab 08/31/17 2330  09/02/17 0628 09/03/17 0534 09/05/17 0652  WBC 13.8*  --  11.3* 8.4 8.9  NEUTROABS 9.5*  --   --   --   --   HGB 6.8*   < > 7.3* 7.2* 7.5*  HCT 23.0*   < > 24.5* 24.0* 25.6*  MCV 73.2*  --  75.9* 75.2* 75.7*  PLT 363  --  350 353 426*   < > = values in this interval not displayed.

## 2017-09-05 NOTE — Progress Notes (Signed)
PROGRESS NOTE  Kathryn Beck  GQQ:[id number] DOB: 1955-11-10 DOA: 08/31/2017 PCP: Fayrene Helper, MD   Brief Narrative: Kathryn Beck is a 62 y.o. female with medical history significant of T2DM, Stage IV CKD, chronic HFpEF, HTN, HLD, OSA not compliant with CPAP, morbid obesity whot presented to the ED for dyspnea and leg swelling for the past 2 weeks following recent admission for pneumonia (got IVF's, discharged 5/31). She was evaluated by her Nephrologist (Dr. Lowanda Foster) on 08/25/17 and her po lasix was increased to 120mg  qAM and 80mg  qPM. She states her weight has remained stable if not slightly decreased since that visit (218lbs this AM), however her LE edema has progressed, making it difficult to ambulate. Evaluation showed creatinine 4, having steadily increased. Nephrology was consulted and after discussion with patient the decision was made to initiate dialysis. LUE AVF was placed 6/13 as back up to the patient's preferred method, peritoneal dialysis. Outpatient nephrology reported an M spike in urine, so SPEP and light chains were sent and pending. High dose IV lasix has been required for diuresis with slow improvement. Plan is to initiate PD following discharge once she's stable on a po regimen.  Assessment & Plan: Principal Problem:   CKD (chronic kidney disease) stage 5, GFR less than 15 ml/min (HCC) Active Problems:   Uncontrolled diabetes mellitus with diabetic nephropathy, with long-term current use of insulin (HCC)   HLD (hyperlipidemia)   Sleep apnea   Anemia of chronic disease   Acute exacerbation of CHF (congestive heart failure) (HCC)   Edema due to hypoalbuminemia  Progressive CKD stage V: Suspected diabetic nephropathy with nephrotic-range proteinuria with now ESRD will start peritoneal dialysis following discharge. - Left brachiocephalic fistula placed 6/71/2458 by Dr. Trula Slade for back up, as pt wishes to have peritoneal dialysis primarily.  - Will follow up with Dr.  Joelyn Oms as outpatient.  - Hold NSAIDs, ARB.  - SPEP, kappa/lambda light chains sent to further evaluate M spike on urine as outpatient. Note Ca is low, not elevated.   Acute on chronic diastolic CHF: Last DC weight 208lbs. Echo 6/12 w/EF 55-60%, G1DD, no WMA's. - Requiring high dose IV lasix for diuresis, 1.6L out yesterday weight trend is erratic but improving. Continue IV diuresis and added metolazone today.  - Monitor I/O, weights.  T2DM: HbA1c 11% (poor control). Initially insulin held due to hypoglycemia, though patient and family report high sugars as outpatient.  - Much better control w/levemir 50u BID, 15u novolog TIDWC + mod SSI. Continue and monitor.  Iron deficiency anemia and anemia of chronic renal disease:  - Given 1u PRBCs, up to 7.8g/dl and stable - Given feraheme x2, aranesp qFriday   HTN:  - Continue clonidine, labetalol.   HLD:  - Continue statin, zetia  DVT prophylaxis: Lovenox Code Status: Full Family Communication: Multiple family members at bedside Disposition Plan: Home when stable on po regimen, possibly 6/17-6/18.  Consultants:   Nephrology  Cardiology  Procedures:   Echocardiogram 6/12:  - Left ventricle: The cavity size was normal. Systolic function was   normal. The estimated ejection fraction was in the range of 55%   to 60%. Wall motion was normal; there were no regional wall   motion abnormalities. Doppler parameters are consistent with   abnormal left ventricular relaxation (grade 1 diastolic   dysfunction). - Left atrium: The atrium was mildly dilated.  09/02/2017 Dr. Trula Slade:  #1: Failed left radiocephalic fistula #2: Left brachiocephalic fistula  Antimicrobials:  None   Subjective:  Legs/feet hurt when walking around, swelling improved but remains worst in feet. No trouble breathing.   Objective: BP 140/65 (BP Location: Right Arm)   Pulse 76   Temp 98.2 F (36.8 C) (Oral)   Resp 18   Ht 5\' 4"  (1.626 m)   Wt 92.7 kg (204  lb 4.8 oz)   SpO2 96%   BMI 35.07 kg/m    Gen: 62 y.o. female in no distress Pulm: Nonlabored breathing room air. Clear. CV: Regular rate and rhythm. No murmur, rub, or gallop. No JVD, TED hose bilateral legs with pitting pedal edema. Ext: Left arm incisions appear normal, +thrill on palpation to [facility] site.   Data Reviewed: I have personally reviewed following labs and imaging studies  CBC: Recent Labs  Lab 08/31/17 1343  08/31/17 2330 09/01/17 0747 09/02/17 0628 09/03/17 0534 09/05/17 0652  WBC 14.0*  --  13.8*  --  11.3* 8.4 8.9  NEUTROABS  --   --  9.5*  --   --   --   --   HGB 7.7*   < > 6.8* 7.8* 7.3* 7.2* 7.5*  HCT 26.5*   < > 23.0* 25.9* 24.5* 24.0* 25.6*  MCV 74.0*  --  73.2*  --  75.9* 75.2* 75.7*  PLT 403*  --  363  --  350 353 426*   < > = values in this interval not displayed.   Basic Metabolic Panel: Recent Labs  Lab 08/31/17 2009 09/01/17 0542 09/02/17 0628 09/03/17 0534 09/04/17 0800 09/05/17 0652  NA  --  143 143 141 141 144  K  --  4.2 4.2 4.0 4.0 3.7  CL  --  109 102 104 105 105  CO2  --  24 27 26 27 28   GLUCOSE  --  78 315* 278* 323* 171*  BUN  --  45* 50* 54* 59* 59*  CREATININE  --  4.45* 4.61* 4.63* 4.22* 4.22*  CALCIUM  --  8.3* 8.6* 8.5* 8.6* 9.1  PHOS 4.4  --   --   --   --   --    GFR: Estimated Creatinine Clearance: 15.4 mL/min (A) (by C-G formula based on SCr of 4.22 mg/dL (H)). Liver Function Tests: Recent Labs  Lab 08/31/17 1343  AST 16  ALT 18  ALKPHOS 94  BILITOT 0.5  PROT 6.3*  ALBUMIN 2.0*   No results for input(s): LIPASE, AMYLASE in the last 168 hours. No results for input(s): AMMONIA in the last 168 hours. Coagulation Profile: Recent Labs  Lab 09/02/17 0628  INR 1.17   Cardiac Enzymes: No results for input(s): CKTOTAL, CKMB, CKMBINDEX, TROPONINI in the last 168 hours. BNP (last 3 results) No results for input(s): PROBNP in the last 8760 hours. HbA1C: No results for input(s): HGBA1C in the last 72  hours. CBG: Recent Labs  Lab 09/04/17 1150 09/04/17 1637 09/04/17 2114 09/05/17 0754 09/05/17 1143  GLUCAP 298* 232* 194* 178* 198*   Lipid Profile: No results for input(s): CHOL, HDL, LDLCALC, TRIG, CHOLHDL, LDLDIRECT in the last 72 hours. Thyroid Function Tests: No results for input(s): TSH, T4TOTAL, FREET4, T3FREE, THYROIDAB in the last 72 hours. Anemia Panel: No results for input(s): VITAMINB12, FOLATE, FERRITIN, TIBC, IRON, RETICCTPCT in the last 72 hours. Urine analysis:    Component Value Date/Time   COLORURINE YELLOW 08/31/2017 1356   APPEARANCEUR HAZY (A) 08/31/2017 1356   LABSPEC 1.016 08/31/2017 1356   PHURINE 5.0 08/31/2017 1356   GLUCOSEU >=500 (A) 08/31/2017 1356  HGBUR SMALL (A) 08/31/2017 1356   BILIRUBINUR NEGATIVE 08/31/2017 1356   BILIRUBINUR neg 02/15/2017 0913   KETONESUR NEGATIVE 08/31/2017 1356   PROTEINUR >=300 (A) 08/31/2017 1356   UROBILINOGEN 0.2 02/15/2017 0913   NITRITE NEGATIVE 08/31/2017 1356   LEUKOCYTESUR NEGATIVE 08/31/2017 1356   Recent Results (from the past 240 hour(s))  Surgical pcr screen     Status: None   Collection Time: 09/01/17 11:05 PM  Result Value Ref Range Status   MRSA, PCR NEGATIVE NEGATIVE Final   Staphylococcus aureus NEGATIVE NEGATIVE Final    Comment: (NOTE) The Xpert SA Assay (FDA approved for NASAL specimens in patients 98 years of age and older), is one component of a comprehensive surveillance program. It is not intended to diagnose infection nor to guide or monitor treatment. Performed at [facility], [place] [address] [place], [place] [address]       Radiology Studies: No results found.  Scheduled Meds: . aspirin EC  81 mg Oral Daily  . atorvastatin  20 mg Oral Daily  . calcitRIOL  0.25 mcg Oral Q M,W,F  . cloNIDine  0.1 mg Oral Q24H  . darbepoetin (ARANESP) injection - NON-DIALYSIS  60 mcg Subcutaneous Q Fri-1800  . enoxaparin (LOVENOX) injection  30 mg Subcutaneous Q24H  . ezetimibe  10 mg  Oral Daily  . gabapentin  100 mg Oral TID  . insulin aspart  0-15 Units Subcutaneous TID WC  . insulin aspart  0-5 Units Subcutaneous QHS  . insulin aspart  15 Units Subcutaneous TID WC  . insulin detemir  50 Units Subcutaneous BID  . isosorbide mononitrate  30 mg Oral Daily  . labetalol  400 mg Oral BID  . pantoprazole  40 mg Oral Daily  . sodium chloride flush  3 mL Intravenous Q12H   Continuous Infusions: . sodium chloride    . sodium chloride Stopped (08/31/17 1908)  . sodium chloride    . sodium chloride 10 mL/hr at 09/02/17 1020  . furosemide 160 mg (09/05/17 1400)     LOS: 5 days   Time spent: 15 minutes.  Patrecia Pour, MD [facility] [email or web address] Password [facility] 09/05/2017, 3:04 PM

## 2017-09-06 ENCOUNTER — Encounter (HOSPITAL_COMMUNITY): Payer: Self-pay | Admitting: Surgery

## 2017-09-06 LAB — GLUCOSE, CAPILLARY
GLUCOSE-CAPILLARY: 227 mg/dL — AB (ref 65–99)
Glucose-Capillary: 199 mg/dL — ABNORMAL HIGH (ref 65–99)
Glucose-Capillary: 207 mg/dL — ABNORMAL HIGH (ref 65–99)
Glucose-Capillary: 183 mg/dL — ABNORMAL HIGH (ref 65–99)

## 2017-09-06 LAB — CBC
HCT: 25.6 % — ABNORMAL LOW (ref 36.0–46.0)
Hemoglobin: 7.5 g/dL — ABNORMAL LOW (ref 12.0–15.0)
MCH: 22.2 pg — ABNORMAL LOW (ref 26.0–34.0)
MCHC: 29.3 g/dL — ABNORMAL LOW (ref 30.0–36.0)
MCV: 75.7 fL — AB (ref 78.0–100.0)
PLATELETS: 441 10*3/uL — AB (ref 150–400)
RBC: 3.38 MIL/uL — ABNORMAL LOW (ref 3.87–5.11)
RDW: 16.9 % — AB (ref 11.5–15.5)
WBC: 10.3 10*3/uL (ref 4.0–10.5)

## 2017-09-06 LAB — PROTEIN ELECTROPHORESIS, SERUM
A/G RATIO SPE: 0.6 — AB (ref 0.7–1.7)
ALPHA-2-GLOBULIN: 1.1 g/dL — AB (ref 0.4–1.0)
Albumin ELP: 2 g/dL — ABNORMAL LOW (ref 2.9–4.4)
Alpha-1-Globulin: 0.4 g/dL (ref 0.0–0.4)
BETA GLOBULIN: 1.3 g/dL (ref 0.7–1.3)
Gamma Globulin: 0.6 g/dL (ref 0.4–1.8)
Globulin, Total: 3.4 g/dL (ref 2.2–3.9)
M-Spike, %: 0.2 g/dL — ABNORMAL HIGH
Total Protein ELP: 5.4 g/dL — ABNORMAL LOW (ref 6.0–8.5)

## 2017-09-06 LAB — BASIC METABOLIC PANEL
Anion gap: 12 (ref 5–15)
BUN: 58 mg/dL — AB (ref 6–20)
CALCIUM: 9.1 mg/dL (ref 8.9–10.3)
CHLORIDE: 101 mmol/L (ref 101–111)
CO2: 29 mmol/L (ref 22–32)
CREATININE: 4.04 mg/dL — AB (ref 0.44–1.00)
GFR calc Af Amer: 13 mL/min — ABNORMAL LOW (ref >60)
GFR, EST NON AFRICAN AMERICAN: 11 mL/min — AB (ref >60)
Glucose, Bld: 315 mg/dL — ABNORMAL HIGH (ref 65–99)
Potassium: 3.5 mmol/L (ref 3.5–5.1)
SODIUM: 142 mmol/L (ref 135–145)

## 2017-09-06 LAB — KAPPA/LAMBDA LIGHT CHAINS
KAPPA, LAMDA LIGHT CHAIN RATIO: 2.04 — AB (ref 0.26–1.65)
Kappa free light chain: 176.3 mg/L — ABNORMAL HIGH (ref 3.3–19.4)
Lambda free light chains: 86.5 mg/L — ABNORMAL HIGH (ref 5.7–26.3)

## 2017-09-06 MED ORDER — METOLAZONE 5 MG PO TABS
5.0000 mg | ORAL_TABLET | Freq: Once | ORAL | Status: AC
  Administered 2017-09-06: 5 mg via ORAL
  Filled 2017-09-06: qty 1

## 2017-09-06 MED ORDER — INSULIN DETEMIR 100 UNIT/ML ~~LOC~~ SOLN
55.0000 [IU] | Freq: Two times a day (BID) | SUBCUTANEOUS | Status: DC
  Administered 2017-09-06 – 2017-09-08 (×4): 55 [IU] via SUBCUTANEOUS
  Filled 2017-09-06 (×5): qty 0.55

## 2017-09-06 NOTE — Progress Notes (Signed)
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  Subjective:   Feels better than previously- "lungs are clearer"  Pt and family updated at bedside- dtr with good questions about PD initiation  Pt's legs are still sore but LE edema better   06/16 0701 - 06/17 0700 In: 1319 [P.O.:1200; I.V.:3; IV Piggyback:116] Out: 1950 [HYQMV:7846]  Filed Weights   09/04/17 0518 09/05/17 0443 09/06/17 0422  Weight: 96.2 kg (212 lb) 92.7 kg (204 lb 4.8 oz) 92 kg (202 lb 13.2 oz)    Scheduled Meds: . aspirin EC  81 mg Oral Daily  . atorvastatin  20 mg Oral Daily  . calcitRIOL  0.25 mcg Oral Q M,W,F  . cloNIDine  0.1 mg Oral Q24H  . darbepoetin (ARANESP) injection - NON-DIALYSIS  60 mcg Subcutaneous Q Fri-1800  . enoxaparin (LOVENOX) injection  30 mg Subcutaneous Q24H  . ezetimibe  10 mg Oral Daily  . gabapentin  100 mg Oral TID  . insulin aspart  0-15 Units Subcutaneous TID WC  . insulin aspart  0-5 Units Subcutaneous QHS  . insulin aspart  15 Units Subcutaneous TID WC  . insulin detemir  50 Units Subcutaneous BID  . isosorbide mononitrate  30 mg Oral Daily  . labetalol  400 mg Oral BID  . pantoprazole  40 mg Oral Daily  . sodium chloride flush  3 mL Intravenous Q12H   Continuous Infusions: . sodium chloride    . sodium chloride Stopped (08/31/17 1908)  . sodium chloride    . sodium chloride Stopped (09/06/17 1017)  . furosemide Stopped (09/06/17 0710)   PRN Meds:.sodium chloride, acetaminophen, albuterol, calcium carbonate (dosed in mg elemental calcium), camphor-menthol **AND** hydrOXYzine, docusate sodium, feeding supplement (NEPRO CARB STEADY), methocarbamol, ondansetron (ZOFRAN) IV, sodium chloride flush, sorbitol, traMADol, zolpidem  Current Labs: reviewed  Results for BROCHA, GILLIAM (MRN [id number]) as of 09/01/2017 14:46  Ref. Range 09/01/2017 05:42  Saturation Ratios Latest Ref Range: 10.4 - 31.8 % 4  (L)  Ferritin Latest Ref Range: 11 - 307 ng/mL 288   Results for SUSI, GOSLIN (MRN [id number]) as of 09/03/2017 09:00  Ref. Range 08/31/2017 22:57  Protein Creatinine Ratio Latest Ref Range: 0.00 - 0.15 mg/mgCre 8.69 (H)   Physical Exam:  Blood pressure (!) 144/66, pulse 78, temperature 97.9 F (36.6 C), temperature source Oral, resp. rate 14, height 5\' 4"  (1.626 m), weight 92 kg (202 lb 13.2 oz), SpO2 96 %. GEN: chronically ill appearing, NAD ENT: Poor dentition NAD EYES: EOMI CV: RRR no rub, nl s1s2 PULM: diminished in bases, no frank crackles this AM ABD: s/nt/nd SKIN: no rashes/lesions EXT:1+ pedal edema, improved LUE AVF, +B/T  A/P 1. 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  She wishes to do PD- pt has number for HT, dtr has already been in contact.  No immediate need for dialysis right now.  2. Hypervoelmia / LEE / SOB / Pulm Edema likely primarily from #1- continue Lasix 160 IV TID and give another dose of metolazone today 6/17.  Follow daily weights and fluid restriction closely.  Hopeful transition in the next 1-2 days  3. Anemia, microcytic, was told had IDA - s/p 510mg  IV Fereheme 09/01/17 + 6/15; started ESA 6/14 4. HTN on CCB and clonidine daily 5. Recent CAP of LLL s/p levofloxacin 6. ? 2HPTH, on C3; PTH 128, P WNL 7. GERD 8. S/p LUE BC AVF 09/02/17 9. ? M Spike  on UPEP at outpt [facility] eval, repeat SPEP and sFLC pending 10. Dispo: pending transition to oral diuretics.   Madelon Lips MD 09/06/2017, 10:19 AM  Recent Labs  Lab 08/31/17 2009  09/04/17 0800 09/05/17 0652 09/06/17 0450  NA  --    < > 141 144 142  K  --    < > 4.0 3.7 3.5  CL  --    < > 105 105 101  CO2  --    < > 27 28 29   GLUCOSE  --    < > 323* 171* 315*  BUN  --    < > 59* 59* 58*  CREATININE  --    < > 4.22* 4.22* 4.04*  CALCIUM  --    < > 8.6* 9.1 9.1  PHOS 4.4  --   --   --   --    < > = values in this interval not displayed.   Recent Labs  Lab 08/31/17 2330   09/03/17 0534 09/05/17 0652 09/06/17 0450  WBC 13.8*   < > 8.4 8.9 10.3  NEUTROABS 9.5*  --   --   --   --   HGB 6.8*   < > 7.2* 7.5* 7.5*  HCT 23.0*   < > 24.0* 25.6* 25.6*  MCV 73.2*   < > 75.2* 75.7* 75.7*  PLT 363   < > 353 426* 441*   < > = values in this interval not displayed.

## 2017-09-06 NOTE — Progress Notes (Signed)
PROGRESS NOTE  Kathryn Beck  EXB:[id number] DOB: 10/29/1955 DOA: 08/31/2017 PCP: Fayrene Helper, MD   Brief Narrative: Kathryn Beck is a 63 y.o. female with medical history significant of T2DM, Stage IV CKD, chronic HFpEF, HTN, HLD, OSA not compliant with CPAP, morbid obesity whot presented to the ED for dyspnea and leg swelling for the past 2 weeks following recent admission for pneumonia (got IVF's, discharged 5/31). She was evaluated by her Nephrologist (Dr. Lowanda Foster) on 08/25/17 and her po lasix was increased to 120mg  qAM and 80mg  qPM. She states her weight has remained stable if not slightly decreased since that visit (218lbs this AM), however her LE edema has progressed, making it difficult to ambulate. Evaluation showed creatinine 4, having steadily increased. Nephrology was consulted and after discussion with patient the decision was made to initiate dialysis. LUE AVF was placed 6/13 as back up to the patient's preferred method, peritoneal dialysis. Outpatient nephrology reported an M spike in urine, so SPEP and light chains were sent and pending. High dose IV lasix has been required for diuresis with slow improvement now that metolazone has been added. Plan is to initiate PD following discharge once she's stable on a po regimen.  Assessment & Plan: Principal Problem:   CKD (chronic kidney disease) stage 5, GFR less than 15 ml/min (HCC) Active Problems:   Uncontrolled diabetes mellitus with diabetic nephropathy, with long-term current use of insulin (HCC)   HLD (hyperlipidemia)   Sleep apnea   Anemia of chronic disease   Acute exacerbation of CHF (congestive heart failure) (HCC)   Edema due to hypoalbuminemia  Progressive CKD stage V: Suspected diabetic nephropathy with nephrotic-range proteinuria with now ESRD will start peritoneal dialysis following discharge. - Left brachiocephalic fistula placed 03/25/7251 (failed attempted left radiocephalic fistula) by Dr. Trula Slade for back  up, as pt wishes to have peritoneal dialysis primarily.  - Will follow up with Dr. Joelyn Oms as outpatient.  - Hold NSAIDs, ARB.  - SPEP, kappa/lambda light chains sent to further evaluate M spike on urine as outpatient. These tests are still pending.  Acute on chronic diastolic CHF: Last DC weight 208lbs. Echo 6/12 w/EF 55-60%, G1DD, no WMA's. - Requiring high dose IV lasix for diuresis with additional metolazone has started turning the corner. Cr improved modestly and edema improved as well. No pulmonary involvement currently. 2L out yesterday. I think she will need another day or two of diuresis. - Monitor I/O, weights.  T2DM: HbA1c 11% (poor control). Having fasting CBGs still elevated, daytime sugars pretty well controlled. Though regimen is heavy on long-acting, will increase this modestly and monitor.  - Levemir 50u BID > 55u BID, continue 15u novolog TIDWC + mod SSI.  Iron deficiency anemia and anemia of chronic renal disease:  - Given 1u PRBCs, up to 7.8g/dl and stable - Given feraheme x2, aranesp qFriday   HTN:  - Continue clonidine, labetalol.   HLD:  - Continue statin, zetia  DVT prophylaxis: Lovenox 30mg  Code Status: Full Family Communication: None at bedside this AM Disposition Plan: Home when stable on po regimen, possibly 6/18-6/19  Consultants:   Nephrology  Cardiology  Procedures:   Echocardiogram 6/12:  - Left ventricle: The cavity size was normal. Systolic function was   normal. The estimated ejection fraction was in the range of 55%   to 60%. Wall motion was normal; there were no regional wall   motion abnormalities. Doppler parameters are consistent with   abnormal left ventricular relaxation (grade 1 diastolic  dysfunction). - Left atrium: The atrium was mildly dilated.  09/02/2017 Dr. Trula Slade:  #1: Failed left radiocephalic fistula #2: Left brachiocephalic fistula  Antimicrobials:  None   Subjective: Feet tender when bearing weight. Not  consistent with prior gout attacks. Gradual onset, actually has been improving over past day or two. No trouble breathing.   Objective: BP (!) 118/53 (BP Location: Right Arm)   Pulse 76   Temp 98.4 F (36.9 C) (Oral)   Resp 20   Ht 5\' 4"  (1.626 m)   Wt 92 kg (202 lb 13.2 oz)   SpO2 97%   BMI 34.81 kg/m   Gen: 62 y.o. female in no distress Pulm: Nonlabored, mildly tachypneic. No crackles or wheezes. CV: Regular rate and rhythm. No murmur, rub, or gallop. No JVD, 1+ pitting pedal edema bilaterally GI: Abdomen soft, non-tender, non-distended, with normoactive bowel sounds.  Ext: Warm, no deformities or tenderness to palpation. No erythema or joint swelling. Left arm incision sites c/d/i without significant erythema or fluctuance. +Thrill.  Skin: No rashes, lesions or ulcers on visualized skin.  Neuro: Alert and oriented. No focal neurological deficits. Psych: Judgement and insight appear fair. Mood euthymic & affect congruent. Behavior is appropriate.    Data Reviewed: I have personally reviewed following labs and imaging studies  CBC: Recent Labs  Lab 08/31/17 2330 09/01/17 0747 09/02/17 0628 09/03/17 0534 09/05/17 0652 09/06/17 0450  WBC 13.8*  --  11.3* 8.4 8.9 10.3  NEUTROABS 9.5*  --   --   --   --   --   HGB 6.8* 7.8* 7.3* 7.2* 7.5* 7.5*  HCT 23.0* 25.9* 24.5* 24.0* 25.6* 25.6*  MCV 73.2*  --  75.9* 75.2* 75.7* 75.7*  PLT 363  --  350 353 426* 741*   Basic Metabolic Panel: Recent Labs  Lab 08/31/17 2009  09/02/17 0628 09/03/17 0534 09/04/17 0800 09/05/17 0652 09/06/17 0450  NA  --    < > 143 141 141 144 142  K  --    < > 4.2 4.0 4.0 3.7 3.5  CL  --    < > 102 104 105 105 101  CO2  --    < > 27 26 27 28 29   GLUCOSE  --    < > 315* 278* 323* 171* 315*  BUN  --    < > 50* 54* 59* 59* 58*  CREATININE  --    < > 4.61* 4.63* 4.22* 4.22* 4.04*  CALCIUM  --    < > 8.6* 8.5* 8.6* 9.1 9.1  PHOS 4.4  --   --   --   --   --   --    < > = values in this interval not  displayed.   GFR: Estimated Creatinine Clearance: 16.1 mL/min (A) (by C-G formula based on SCr of 4.04 mg/dL (H)). Liver Function Tests: Recent Labs  Lab 08/31/17 1343  AST 16  ALT 18  ALKPHOS 94  BILITOT 0.5  PROT 6.3*  ALBUMIN 2.0*   No results for input(s): LIPASE, AMYLASE in the last 168 hours. No results for input(s): AMMONIA in the last 168 hours. Coagulation Profile: Recent Labs  Lab 09/02/17 0628  INR 1.17   Cardiac Enzymes: No results for input(s): CKTOTAL, CKMB, CKMBINDEX, TROPONINI in the last 168 hours. BNP (last 3 results) No results for input(s): PROBNP in the last 8760 hours. HbA1C: No results for input(s): HGBA1C in the last 72 hours. CBG: Recent Labs  Lab 09/05/17 1143  09/05/17 1625 09/05/17 2135 09/06/17 0733 09/06/17 1129  GLUCAP 198* 165* 122* 227* 199*   Lipid Profile: No results for input(s): CHOL, HDL, LDLCALC, TRIG, CHOLHDL, LDLDIRECT in the last 72 hours. Thyroid Function Tests: No results for input(s): TSH, T4TOTAL, FREET4, T3FREE, THYROIDAB in the last 72 hours. Anemia Panel: No results for input(s): VITAMINB12, FOLATE, FERRITIN, TIBC, IRON, RETICCTPCT in the last 72 hours. Urine analysis:    Component Value Date/Time   COLORURINE YELLOW 08/31/2017 1356   APPEARANCEUR HAZY (A) 08/31/2017 1356   LABSPEC 1.016 08/31/2017 1356   PHURINE 5.0 08/31/2017 1356   GLUCOSEU >=500 (A) 08/31/2017 1356   HGBUR SMALL (A) 08/31/2017 1356   BILIRUBINUR NEGATIVE 08/31/2017 1356   BILIRUBINUR neg 02/15/2017 0913   KETONESUR NEGATIVE 08/31/2017 1356   PROTEINUR >=300 (A) 08/31/2017 1356   UROBILINOGEN 0.2 02/15/2017 0913   NITRITE NEGATIVE 08/31/2017 1356   LEUKOCYTESUR NEGATIVE 08/31/2017 1356   Recent Results (from the past 240 hour(s))  Surgical pcr screen     Status: None   Collection Time: 09/01/17 11:05 PM  Result Value Ref Range Status   MRSA, PCR NEGATIVE NEGATIVE Final   Staphylococcus aureus NEGATIVE NEGATIVE Final    Comment:  (NOTE) The Xpert SA Assay (FDA approved for NASAL specimens in patients 103 years of age and older), is one component of a comprehensive surveillance program. It is not intended to diagnose infection nor to guide or monitor treatment. Performed at [facility], [place] [address] [place], [place] [address]       Radiology Studies: No results found.  Scheduled Meds: . aspirin EC  81 mg Oral Daily  . atorvastatin  20 mg Oral Daily  . calcitRIOL  0.25 mcg Oral Q M,W,F  . cloNIDine  0.1 mg Oral Q24H  . darbepoetin (ARANESP) injection - NON-DIALYSIS  60 mcg Subcutaneous Q Fri-1800  . enoxaparin (LOVENOX) injection  30 mg Subcutaneous Q24H  . ezetimibe  10 mg Oral Daily  . gabapentin  100 mg Oral TID  . insulin aspart  0-15 Units Subcutaneous TID WC  . insulin aspart  0-5 Units Subcutaneous QHS  . insulin aspart  15 Units Subcutaneous TID WC  . insulin detemir  50 Units Subcutaneous BID  . isosorbide mononitrate  30 mg Oral Daily  . labetalol  400 mg Oral BID  . pantoprazole  40 mg Oral Daily  . sodium chloride flush  3 mL Intravenous Q12H   Continuous Infusions: . sodium chloride    . sodium chloride Stopped (08/31/17 1908)  . sodium chloride    . sodium chloride Stopped (09/06/17 1017)  . furosemide Stopped (09/06/17 0710)     LOS: 6 days   Time spent: 25 minutes.  Patrecia Pour, MD [facility] [email or web address] Password [facility] 09/06/2017, 12:37 PM

## 2017-09-06 NOTE — Plan of Care (Signed)
Nutrition Education Note  RD consulted for Renal Education.   Case discussed with RN prior to visit, who confirms pt plan to transition to PD at discharge. Pt family continues to bring in high sodium foods, such as [place] to pt.   Spoke with pt and sister. Per pt, pt daughter (who plans to stay with pt at discharge) has many questions about diet when pt discharges. Per pt, she recalls last RD visit but "I'm forgetful" and relies on family members to assist with education. Spent a lot of time with pt and sister reinforcing low sodium diet education, which is the basis on renal diet. Discussed importance of complying with low sodium diet to help prevent further readmissions.   Pt unsure when daughter will be available. RD provided handout with RD contact information. RD will attempt to follow-up with education when daughter is present.   Provided "[place] For Patient with Kidney disease" to patient/family. Reviewed food groups and provided written recommended serving sizes specifically determined for patient's current nutritional status.   Explained why diet restrictions are needed and provided lists of foods to limit/avoid that are high potassium, sodium, and phosphorus. Provided specific recommendations on safer alternatives of these foods. Strongly encouraged compliance of this diet.   Discussed importance of protein intake at each meal and snack. Provided examples of how to maximize protein intake throughout the day. Discussed need for fluid restriction with dialysis, importance of minimizing weight gain between HD treatments, and renal-friendly beverage options.  Encouraged pt to discuss specific diet questions/concerns with RD at HD outpatient facility. Teach back method used.  Expect fair compliance.  Body mass index is 34.81 kg/m. Pt meets criteria for obesity, class II based on current BMI.  Current diet order is renal with 1200 ml fluid restriction, patient is  consuming approximately 30-100% of meals at this time. Labs and medications reviewed. No further nutrition interventions warranted at this time. RD contact information provided. If additional nutrition issues arise, please re-consult RD.  Keiandra Sullenger A. [name], RD, LDN, CDE Pager: [phone] After hours Pager: [phone]

## 2017-09-07 ENCOUNTER — Other Ambulatory Visit: Payer: Self-pay

## 2017-09-07 DIAGNOSIS — Z48812 Encounter for surgical aftercare following surgery on the circulatory system: Secondary | ICD-10-CM

## 2017-09-07 DIAGNOSIS — N185 Chronic kidney disease, stage 5: Secondary | ICD-10-CM

## 2017-09-07 LAB — BASIC METABOLIC PANEL
Anion gap: 12 (ref 5–15)
BUN: 58 mg/dL — ABNORMAL HIGH (ref 6–20)
CALCIUM: 9.2 mg/dL (ref 8.9–10.3)
CHLORIDE: 101 mmol/L (ref 101–111)
CO2: 29 mmol/L (ref 22–32)
CREATININE: 4.1 mg/dL — AB (ref 0.44–1.00)
GFR calc non Af Amer: 11 mL/min — ABNORMAL LOW (ref >60)
GFR, EST AFRICAN AMERICAN: 13 mL/min — AB (ref >60)
GLUCOSE: 180 mg/dL — AB (ref 65–99)
Potassium: 3.4 mmol/L — ABNORMAL LOW (ref 3.5–5.1)
Sodium: 142 mmol/L (ref 135–145)

## 2017-09-07 LAB — HEPATITIS B SURFACE ANTIGEN: Hepatitis B Surface Ag: NEGATIVE

## 2017-09-07 LAB — GLUCOSE, CAPILLARY
GLUCOSE-CAPILLARY: 139 mg/dL — AB (ref 65–99)
GLUCOSE-CAPILLARY: 149 mg/dL — AB (ref 65–99)
Glucose-Capillary: 120 mg/dL — ABNORMAL HIGH (ref 65–99)
Glucose-Capillary: 144 mg/dL — ABNORMAL HIGH (ref 65–99)

## 2017-09-07 MED ORDER — POTASSIUM CHLORIDE 20 MEQ PO PACK
40.0000 meq | PACK | Freq: Every day | ORAL | Status: DC
  Administered 2017-09-07 – 2017-09-08 (×2): 40 meq via ORAL
  Filled 2017-09-07 (×2): qty 2

## 2017-09-07 MED ORDER — METOLAZONE 5 MG PO TABS
5.0000 mg | ORAL_TABLET | Freq: Once | ORAL | Status: AC
  Administered 2017-09-07: 5 mg via ORAL
  Filled 2017-09-07: qty 1

## 2017-09-07 MED ORDER — TORSEMIDE 20 MG PO TABS
100.0000 mg | ORAL_TABLET | Freq: Two times a day (BID) | ORAL | Status: DC
Start: 2017-09-07 — End: 2017-09-08
  Administered 2017-09-07 – 2017-09-08 (×3): 100 mg via ORAL
  Filled 2017-09-07 (×3): qty 5

## 2017-09-07 NOTE — Progress Notes (Signed)
Brief Nutrition Follow-Up Note  Re-visited pt today; noted pt was sleeping soundly and pt sister was on phone at time of visit. Handout provided yesterday has been removed from bedside table. Pt has received education on both 09/01/16 and 09/06/17; compliance of low sodium diet was reinforced at both sessions.   Noted new plan to discharge to short term SNF. RD at SNF facility can also assist with pt education when pt transitions to home (pt will also have access to RD at outpatient dialysis facility).   No further educational needs identified at this time, but can reinforce education with pt daughter upon request.   Temisha Murley A. [name], RD, LDN, CDE Pager: [phone] After hours Pager: [phone]

## 2017-09-07 NOTE — Progress Notes (Signed)
PROGRESS NOTE  Kathryn Beck  KGM:[id number] DOB: 1955/10/16 DOA: 08/31/2017 PCP: Fayrene Helper, MD   Brief Narrative: Kathryn Beck is a 62 y.o. female with medical history significant of T2DM, Stage IV CKD, chronic HFpEF, HTN, HLD, OSA not compliant with CPAP, morbid obesity whot presented to the ED for dyspnea and leg swelling for the past 2 weeks following recent admission for pneumonia (got IVF's, discharged 5/31). She was evaluated by her Nephrologist (Dr. Lowanda Foster) on 08/25/17 and her po lasix was increased to 120mg  qAM and 80mg  qPM. She states her weight has remained stable if not slightly decreased since that visit (218lbs this AM), however her LE edema has progressed, making it difficult to ambulate. Evaluation showed creatinine 4, having steadily increased. Nephrology was consulted and after discussion with patient the decision was made to initiate dialysis. LUE AVF was placed 6/13 as back up to the patient's preferred method, peritoneal dialysis. Outpatient nephrology reported an M spike in urine, so SPEP and light chains were sent and pending. High dose IV lasix has been required for diuresis with slow improvement now that metolazone has been added. Plan is to initiate PD following discharge once she's stable on a po regimen. With increased swelling in the legs, the patient has been much less mobile and seems to have grown deconditioned to the point the SNF rehabilitation may be necessary prior to return home. Social work has been consulted.  Assessment & Plan: Principal Problem:   CKD (chronic kidney disease) stage 5, GFR less than 15 ml/min (HCC) Active Problems:   Uncontrolled diabetes mellitus with diabetic nephropathy, with long-term current use of insulin (HCC)   HLD (hyperlipidemia)   Sleep apnea   Anemia of chronic disease   Acute exacerbation of CHF (congestive heart failure) (HCC)   Edema due to hypoalbuminemia  Progressive CKD stage V: Suspected diabetic nephropathy  with nephrotic-range proteinuria with now ESRD will start peritoneal dialysis following discharge. - Left brachiocephalic fistula placed 6/44/0347 (failed attempted left radiocephalic fistula) by Dr. Trula Slade for back up, as pt wishes to have peritoneal dialysis primarily.  - Plans PD as outpatient, though this may be delayed if she needs to discharge to SNF and may necessitate HD initially. She plans to follow up with Dr. Joelyn Oms as outpatient.  - Hold NSAIDs, ARB.  - SPEP w/M-spike 0.2 and expected K/L LC ratio in CKD. Will need outpatient evaluation.  Acute on chronic diastolic CHF: Last DC weight 208lbs. Echo 6/12 w/EF 55-60%, G1DD, no WMA's. - Transition to torsemide 100mg  po BID + 5mg  metolazone and monitor diuresis. SCr stable. - Monitor I/O, weights.  T2DM: HbA1c 11% (poor control). Having fasting CBGs still elevated, daytime sugars pretty well controlled. Though regimen is heavy on long-acting, will increase this modestly and monitor.  - Levemir 50u BID > 55u BID, continue 15u novolog TIDWC + mod SSI. Inpatient values much improved with titration.  Iron deficiency anemia and anemia of chronic renal disease:  - Given 1u PRBCs, up to 7.8g/dl and stable - Given feraheme x2, aranesp qFriday   HTN:  - Continue clonidine, labetalol.   HLD:  - Continue statin, zetia  DVT prophylaxis: Lovenox 30mg  Code Status: Full Family Communication: At bedside this morning. Disposition Plan: Pending stability, improvement on po diuretics. Recheck volume status, renal function in AM. PT recommending SNF, so CSW performing bed search.  Consultants:   Nephrology  Cardiology  Procedures:   Echocardiogram 6/12:  - Left ventricle: The cavity size was normal. Systolic  function was   normal. The estimated ejection fraction was in the range of 55%   to 60%. Wall motion was normal; there were no regional wall   motion abnormalities. Doppler parameters are consistent with   abnormal left ventricular  relaxation (grade 1 diastolic   dysfunction). - Left atrium: The atrium was mildly dilated.  09/02/2017 Dr. Trula Slade:  #1: Failed left radiocephalic fistula #2: Left brachiocephalic fistula  Antimicrobials:  None   Subjective: Overall swelling in legs is significantly improved as is pain in them, but still has pain that limits her ability/motivation to walk. Denies any breathing troubles. Making good urine (2.75L yest)   Objective: BP (!) 112/59 (BP Location: Right Arm)   Pulse 68   Temp 98.7 F (37.1 C) (Oral)   Resp 18   Ht 5\' 4"  (1.626 m)   Wt 91.6 kg (201 lb 15.1 oz)   SpO2 98%   BMI 34.66 kg/m    Gen: 62 y.o. female in no distress Pulm: Nonlabored breathing room air. Clear without crackles. CV: Regular rate and rhythm. No murmur, rub, or gallop. No JVD, 1+ pitting symmetric LE dependent mostly pedal edema. GI: Abdomen soft, non-tender, non-distended, with normoactive bowel sounds.  Ext: Warm, no deformities Skin: Left arm incisions w/dermabond c/d/i, no erythema or discharge. +thrill in [facility] site Neuro: Alert and oriented. No focal neurological deficits. Psych: Judgement and insight appear fair. Mood euthymic & affect congruent. Behavior is appropriate.    Data Reviewed: I have personally reviewed following labs and imaging studies  CBC: Recent Labs  Lab 08/31/17 2330 09/01/17 0747 09/02/17 0628 09/03/17 0534 09/05/17 0652 09/06/17 0450  WBC 13.8*  --  11.3* 8.4 8.9 10.3  NEUTROABS 9.5*  --   --   --   --   --   HGB 6.8* 7.8* 7.3* 7.2* 7.5* 7.5*  HCT 23.0* 25.9* 24.5* 24.0* 25.6* 25.6*  MCV 73.2*  --  75.9* 75.2* 75.7* 75.7*  PLT 363  --  350 353 426* 790*   Basic Metabolic Panel: Recent Labs  Lab 08/31/17 2009  09/03/17 0534 09/04/17 0800 09/05/17 0652 09/06/17 0450 09/07/17 0429  NA  --    < > 141 141 144 142 142  K  --    < > 4.0 4.0 3.7 3.5 3.4*  CL  --    < > 104 105 105 101 101  CO2  --    < > 26 27 28 29 29   GLUCOSE  --    < > 278* 323* 171*  315* 180*  BUN  --    < > 54* 59* 59* 58* 58*  CREATININE  --    < > 4.63* 4.22* 4.22* 4.04* 4.10*  CALCIUM  --    < > 8.5* 8.6* 9.1 9.1 9.2  PHOS 4.4  --   --   --   --   --   --    < > = values in this interval not displayed.   GFR: Estimated Creatinine Clearance: 15.8 mL/min (A) (by C-G formula based on SCr of 4.1 mg/dL (H)). Liver Function Tests: No results for input(s): AST, ALT, ALKPHOS, BILITOT, PROT, ALBUMIN in the last 168 hours. No results for input(s): LIPASE, AMYLASE in the last 168 hours. No results for input(s): AMMONIA in the last 168 hours. Coagulation Profile: Recent Labs  Lab 09/02/17 0628  INR 1.17   Cardiac Enzymes: No results for input(s): CKTOTAL, CKMB, CKMBINDEX, TROPONINI in the last 168 hours. BNP (last  3 results) No results for input(s): PROBNP in the last 8760 hours. HbA1C: No results for input(s): HGBA1C in the last 72 hours. CBG: Recent Labs  Lab 09/06/17 1129 09/06/17 1622 09/06/17 2059 09/07/17 0738 09/07/17 1141  GLUCAP 199* 207* 183* 139* 149*   Lipid Profile: No results for input(s): CHOL, HDL, LDLCALC, TRIG, CHOLHDL, LDLDIRECT in the last 72 hours. Thyroid Function Tests: No results for input(s): TSH, T4TOTAL, FREET4, T3FREE, THYROIDAB in the last 72 hours. Anemia Panel: No results for input(s): VITAMINB12, FOLATE, FERRITIN, TIBC, IRON, RETICCTPCT in the last 72 hours. Urine analysis:    Component Value Date/Time   COLORURINE YELLOW 08/31/2017 1356   APPEARANCEUR HAZY (A) 08/31/2017 1356   LABSPEC 1.016 08/31/2017 1356   PHURINE 5.0 08/31/2017 1356   GLUCOSEU >=500 (A) 08/31/2017 1356   HGBUR SMALL (A) 08/31/2017 1356   BILIRUBINUR NEGATIVE 08/31/2017 1356   BILIRUBINUR neg 02/15/2017 0913   KETONESUR NEGATIVE 08/31/2017 1356   PROTEINUR >=300 (A) 08/31/2017 1356   UROBILINOGEN 0.2 02/15/2017 0913   NITRITE NEGATIVE 08/31/2017 1356   LEUKOCYTESUR NEGATIVE 08/31/2017 1356   Recent Results (from the past 240 hour(s))    Surgical pcr screen     Status: None   Collection Time: 09/01/17 11:05 PM  Result Value Ref Range Status   MRSA, PCR NEGATIVE NEGATIVE Final   Staphylococcus aureus NEGATIVE NEGATIVE Final    Comment: (NOTE) The Xpert SA Assay (FDA approved for NASAL specimens in patients 44 years of age and older), is one component of a comprehensive surveillance program. It is not intended to diagnose infection nor to guide or monitor treatment. Performed at [facility], [place] [address] [place], [place] [address]       Radiology Studies: No results found.  Scheduled Meds: . aspirin EC  81 mg Oral Daily  . atorvastatin  20 mg Oral Daily  . calcitRIOL  0.25 mcg Oral Q M,W,F  . cloNIDine  0.1 mg Oral Q24H  . darbepoetin (ARANESP) injection - NON-DIALYSIS  60 mcg Subcutaneous Q Fri-1800  . enoxaparin (LOVENOX) injection  30 mg Subcutaneous Q24H  . ezetimibe  10 mg Oral Daily  . gabapentin  100 mg Oral TID  . insulin aspart  0-15 Units Subcutaneous TID WC  . insulin aspart  0-5 Units Subcutaneous QHS  . insulin aspart  15 Units Subcutaneous TID WC  . insulin detemir  55 Units Subcutaneous BID  . isosorbide mononitrate  30 mg Oral Daily  . labetalol  400 mg Oral BID  . pantoprazole  40 mg Oral Daily  . potassium chloride  40 mEq Oral Daily  . sodium chloride flush  3 mL Intravenous Q12H  . torsemide  100 mg Oral BID   Continuous Infusions: . sodium chloride    . sodium chloride Stopped (08/31/17 1908)  . sodium chloride    . sodium chloride Stopped (09/06/17 1017)     LOS: 7 days   Time spent: 25 minutes.  Patrecia Pour, MD [facility] [email or web address] Password [email or web address] 09/07/2017, 2:15 PM

## 2017-09-07 NOTE — Progress Notes (Signed)
Patient refused CPAP.

## 2017-09-07 NOTE — Clinical Social Work Note (Signed)
Clinical Social Work Assessment  Patient Details  Name: Kathryn Beck MRN: [id number] Date of Birth: 09/26/1955  Date of referral:  09/07/17               Reason for consult:  Facility Placement, Discharge Planning                Permission sought to share information with:  Family Supports Permission granted to share information::  Yes, Verbal Permission Granted  Name::     Larry Ruhe  Agency::  snf  Relationship::  spouse  Contact Information:  [phone]  Housing/Transportation Living arrangements for the past 2 months:  Single Family Home Source of Information:  Patient Patient Interpreter Needed:  None Criminal Activity/Legal Involvement Pertinent to Current Situation/Hospitalization:    Significant Relationships:  Adult Children, Other Family Members, Spouse, Siblings Lives with:  Spouse Do you feel safe going back to the place where you live?  Yes Need for family participation in patient care:  Yes (Comment)  Care giving concerns:  CSW met pateint at bedside but patient was asleep. Patient had daughter in law and grandchildren at bedside. Daughter in law stated that CSW will need to talk to patients daughter since patient is currently asleep.  Social Worker assessment / plan:  CSW spoke with daughter via phone. CSW went over the process of SNF with daughter. Daughter stated that patients lives at home with spouse and has support from family in the area. Daughter stated she would like patient to go to rehab in the [place] area since she will be close to support system. Daughter stated she will talk to patients spouse and his brother before picking a SNF bed. Daughter stated she will also talk to family about Home Health. CSW stated that Home Health will not be at home everyday. Daughter stated she will talk to family and reach back out to floor CSW once decision has been made. CSW has worked up patient and faxed her out to facilities in the [place] area.  Employment  status:    Insurance information:  Other (Comment Required)([employer]) PT Recommendations:  Skilled Nursing Facility Information / Referral to community resources:  Skilled Nursing Facility  Patient/Family's Response to care:  Family has clear understanding of patients diagnoses. Family supportive of patients care  Patient/Family's Understanding of and Emotional Response to Diagnosis, Current Treatment, and Prognosis:  Family will discuss patients discharge plan Emotional Assessment Appearance:  Appears stated age Attitude/Demeanor/Rapport:  Unable to Assess Affect (typically observed):  Unable to Assess Orientation:  Oriented to Self, Oriented to Place, Oriented to  Time, Oriented to Situation Alcohol / Substance use:  Not Applicable Psych involvement (Current and /or in the community):  No (Comment)  Discharge Needs  Concerns to be addressed:  Care Coordination Readmission within the last 30 days:  No Current discharge risk:  None Barriers to Discharge:  Continued Medical Work up    C , LCSW 09/07/2017, 4:34 PM  

## 2017-09-07 NOTE — Care Management Note (Signed)
Case Management Note  Patient Details  Name: Kathryn Beck MRN: [id number] Date of Birth: April 11, 1955  Subjective/Objective:                 Spoke w patient and sister at bedside. Patient lives at home w spouse. At current time she is too weak to ambulate in house safely, MD requested CM to speak to patient about SNF. Patient and sister stated they are agreeable to SNF, they prefer SNF in [place]. They understand this would help maximize mobility prior to returning home with spouse. UPdated CSW and laced consult for placement.    Action/Plan:   Expected Discharge Date:  09/02/17               Expected Discharge Plan:  Skilled Nursing Facility  In-House Referral:  Clinical Social Work  Discharge planning Services  CM Consult  Post Acute Care Choice:    Choice offered to:     DME Arranged:    DME Agency:     HH Arranged:    [place] Agency:     Status of Service:  In process, will continue to follow  If discussed at Long Length of Stay Meetings, dates discussed:    Additional Comments:  Carles Collet, RN 09/07/2017, 11:28 AM

## 2017-09-07 NOTE — NC FL2 (Signed)
[place] MEDICAID FL2 LEVEL OF CARE SCREENING TOOL     IDENTIFICATION  Patient Name: Kathryn Beck Birthdate: [birth year] Sex: female Admission Date (Current Location): 08/31/2017  [facility] and [place] Number:  Herbalist and Address:  [facility], [place] [address] [place],  [address]      Provider Number: [id number]  Attending Physician Name and Address:  Patrecia Pour, MD  Relative Name and Phone Number:  Artemisia Auvil [phone]    Current Level of Care: Hospital Recommended Level of Care: [place] Prior Approval Number:    Date Approved/Denied:   PASRR Number: [id number]  Discharge Plan: SNF    Current Diagnoses: Patient Active Problem List   Diagnosis Date Noted  . Adrenal adenoma 09/03/2017  . Acute exacerbation of CHF (congestive heart failure) ([place]) 08/31/2017  . Edema due to hypoalbuminemia 08/31/2017  . Shortness of breath   . Community acquired pneumonia 08/18/2017  . Uncontrolled type 2 diabetes mellitus with nephropathy ([place]) 08/18/2017  . Acute renal failure superimposed on stage 3 chronic Beck disease ([place]) 08/18/2017  . Unsteady gait 07/11/2017  . Recurrent falls 07/11/2017  . Anemia of chronic disease 03/24/2017  . History of colonic polyps 02/10/2017  . Dysphagia 11/05/2016  . IBS (irritable bowel syndrome) 02/04/2016  . Hospital discharge follow-up 10/27/2015  . Personal history of noncompliance with medical treatment, presenting hazards to health 10/21/2015  . Postprandial epigastric pain   . Heat sensitivity 10/07/2014  . Cardiac murmur 01/17/2014  . Hyperuricemia 08/04/2013  . Seasonal allergies 03/10/2013  . Lipoma of back 12/22/2012  . GERD (gastroesophageal reflux disease) 08/22/2012  . Sleep apnea 06/02/2011  . CKD (chronic Beck disease) stage 5, GFR less than 15 ml/min (HCC) 01/13/2011  . BACK PAIN WITH RADICULOPATHY 04/16/2010  . FATIGUE 07/24/2009  . LIVER FUNCTION  TESTS, ABNORMAL, HX OF 05/08/2009  . LUPUS ERYTHEMATOSUS, DISCOID 06/05/2008  . Uncontrolled diabetes mellitus with diabetic nephropathy, with long-term current use of insulin ([place]) 06/07/2007  . HLD (hyperlipidemia) 06/07/2007  . Obesity 06/07/2007  . Malignant hypertension 06/07/2007    Orientation RESPIRATION BLADDER Height & Weight     Self, Time, Situation, Place  Normal External catheter Weight: 201 lb 15.1 oz (91.6 kg) Height:  5\' 4"  (162.6 cm)  BEHAVIORAL SYMPTOMS/MOOD NEUROLOGICAL BOWEL NUTRITION STATUS      Continent Diet(renal with fluid restriction)  AMBULATORY STATUS COMMUNICATION OF NEEDS Skin   Limited Assist Verbally                         Personal Care Assistance Level of Assistance  Bathing, Feeding, Dressing Bathing Assistance: Limited assistance Feeding assistance: Independent Dressing Assistance: Limited assistance     Functional Limitations Info  Sight, Speech, Hearing Sight Info: Adequate Hearing Info: Adequate Speech Info: Adequate    SPECIAL CARE FACTORS FREQUENCY  PT (By licensed PT)     PT Frequency: 5x wk              Contractures Contractures Info: Not present    Additional Factors Info  Code Status, Allergies Code Status Info: Full code Allergies Info: ACE INHIBITORS, PENICILLINS, STATINS, ALBUTEROL           Current Medications (09/07/2017):  This is the current hospital active medication list Current Facility-Administered Medications  Medication Dose Route Frequency Provider Last Rate Last Dose  . 0.9 %  sodium chloride infusion  250 mL Intravenous PRN Ulyses Amor, PA-C      .  0.9 %  sodium chloride infusion   Intravenous Continuous Ulyses Amor, PA-C   Stopped at 08/31/17 1908  . 0.9 %  sodium chloride infusion   Intravenous Once Marquez M, PA-C      . 0.9 %  sodium chloride infusion   Intravenous Continuous Ulyses Amor, PA-C   Stopped at 09/06/17 1017  . acetaminophen (TYLENOL) tablet 650 mg  650 mg  Oral Q4H PRN Ulyses Amor, PA-C   650 mg at 09/07/17 1229  . albuterol (PROVENTIL) (2.5 MG/3ML) 0.083% nebulizer solution 3 mL  3 mL Inhalation Q4H PRN Laurence Slate M, PA-C      . aspirin EC tablet 81 mg  81 mg Oral Daily Laurence Slate M, PA-C   81 mg at 09/07/17 0910  . atorvastatin (LIPITOR) tablet 20 mg  20 mg Oral Daily Laurence Slate M, PA-C   20 mg at 09/07/17 0910  . calcitRIOL (ROCALTROL) capsule 0.25 mcg  0.25 mcg Oral Q M,W,F Laurence Slate M, PA-C   0.25 mcg at 09/06/17 1010  . calcium carbonate (dosed in mg elemental calcium) suspension 500 mg of elemental calcium  500 mg of elemental calcium Oral Q6H PRN Laurence Slate M, PA-C      . camphor-menthol [facility]) lotion 1 application  1 application Topical X1G PRN Laurence Slate M, PA-C       And  . hydrOXYzine (ATARAX/VISTARIL) tablet 25 mg  25 mg Oral Q8H PRN Laurence Slate M, PA-C      . cloNIDine (CATAPRES) tablet 0.1 mg  0.1 mg Oral Q24H Laurence Slate M, PA-C   0.1 mg at 09/06/17 2128  . Darbepoetin Alfa (ARANESP) injection 60 mcg  60 mcg Subcutaneous Q Fri-1800 Pearson Grippe B, MD   60 mcg at 09/03/17 2057  . docusate sodium (ENEMEEZ) enema 283 mg  1 enema Rectal PRN Ulyses Amor, PA-C      . enoxaparin (LOVENOX) injection 30 mg  30 mg Subcutaneous Q24H Laurence Slate M, PA-C   30 mg at 09/06/17 2128  . ezetimibe (ZETIA) tablet 10 mg  10 mg Oral Daily Laurence Slate M, PA-C   10 mg at 09/07/17 0910  . feeding supplement (NEPRO CARB STEADY) liquid 237 mL  237 mL Oral TID PRN Ulyses Amor, PA-C      . gabapentin (NEURONTIN) capsule 100 mg  100 mg Oral TID Ulyses Amor, PA-C   100 mg at 09/07/17 0910  . insulin aspart (novoLOG) injection 0-15 Units  0-15 Units Subcutaneous TID WC Ulyses Amor, PA-C   2 Units at 09/07/17 1230  . insulin aspart (novoLOG) injection 0-5 Units  0-5 Units Subcutaneous QHS Ulyses Amor, PA-C   4 Units at 09/03/17 2300  . insulin aspart (novoLOG) injection 15 Units  15 Units Subcutaneous TID WC Patrecia Pour, MD   15 Units at 09/07/17 1230  . insulin detemir (LEVEMIR) injection 55 Units  55 Units Subcutaneous BID Patrecia Pour, MD   55 Units at 09/07/17 [phone]  . isosorbide mononitrate (IMDUR) 24 hr tablet 30 mg  30 mg Oral Daily Laurence Slate M, PA-C   30 mg at 09/07/17 4854  . labetalol (NORMODYNE) tablet 400 mg  400 mg Oral BID Laurence Slate M, PA-C   400 mg at 09/07/17 0910  . methocarbamol (ROBAXIN) tablet 500 mg  500 mg Oral Q8H PRN Ulyses Amor, PA-C   500 mg at 09/07/17 1229  . ondansetron (ZOFRAN) injection 4 mg  4 mg Intravenous Q6H PRN Laurence Slate M, PA-C      . pantoprazole (PROTONIX) EC tablet 40 mg  40 mg Oral Daily Laurence Slate M, PA-C   40 mg at 09/07/17 0910  . potassium chloride (KLOR-CON) packet 40 mEq  40 mEq Oral Daily Madelon Lips, MD   40 mEq at 09/07/17 1301  . sodium chloride flush (NS) 0.9 % injection 3 mL  3 mL Intravenous Q12H Laurence Slate M, PA-C   3 mL at 09/07/17 1232  . sodium chloride flush (NS) 0.9 % injection 3 mL  3 mL Intravenous PRN Ulyses Amor, PA-C   3 mL at 09/06/17 1435  . sorbitol 70 % solution 30 mL  30 mL Oral PRN Ulyses Amor, PA-C      . torsemide [facility]) tablet 100 mg  100 mg Oral BID Madelon Lips, MD      . traMADol [facility]at[facility]) tablet 50 mg  50 mg Oral Q12H PRN Patrecia Pour, MD   50 mg at 09/07/17 5102  . zolpidem (AMBIEN) tablet 5 mg  5 mg Oral QHS PRN Ulyses Amor, PA-C   5 mg at 09/06/17 2140     Discharge Medications: Please see discharge summary for a list of discharge medications.  Relevant Imaging Results:  Relevant Lab Results:   Additional Information SS# [id number]  Wende Neighbors, LCSW

## 2017-09-07 NOTE — Progress Notes (Signed)
[facility] Progress Note    Assessment/ Plan:   1. 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  She wishes to do PD- pt has number for HT, dtr has already been in contact and they have appt on Friday.  No immediate need for dialysis right now.  If she does go to SNF will need to wait until at home for initiation of PD, so may need HD in the interim if progresses to needing dialysis as OP.  Cr 4.1 today. 2. Hypervoelmia / LEE / SOB / Pulm Edema likely primarily from #1-  transitioning to torsemide 100 BID with metolazone 5 mg. 3. Anemia, microcytic, was told had IDA - s/p 510mg  IV Fereheme 09/01/17 + 6/15; started ESA 6/14 4. HTN on CCB and clonidine daily 5. Recent CAP of LLL s/p levofloxacin 6. ? 2HPTH, on C3; PTH 128, P WNL 7. GERD 8. S/p LUE BC AVF 09/02/17 9. ? M Spike on UPEP at outpt [facility] eval, repeat SPEP here with M-spike 0.2 and expected K/L light chain ratio in CKD--> can do OP eval.   10. Dispo: pending transition to oral diuretics.     Subjective:    Good diuresis.  Getting up to chair- not a lot of motivation to move.  If she is discharged to SNF, she can't train for PD immediately as planned previously--> will have to see how she responds to PO diuretics for dispo planning   Objective:   BP (!) 112/59 (BP Location: Right Arm)   Pulse 68   Temp 98.7 F (37.1 C) (Oral)   Resp 18   Ht 5\' 4"  (1.626 m)   Wt 91.6 kg (201 lb 15.1 oz)   SpO2 98%   BMI 34.66 kg/m   Intake/Output Summary (Last 24 hours) at 09/07/2017 1313 Last data filed at 09/07/2017 1300 Gross per 24 hour  Intake 1742 ml  Output 2650 ml  Net -908 ml   Weight change: -0.4 kg (-14.1 oz)  Physical Exam: OIB:[id number] ill appearing, NAD HWT:UUEK dentition NAD EYES:EOMI CV:RRR no rub, nl s1s2 PULM:clear bilaterally, off O2  ABD:s/nt/nd SKIN:no rashes/lesions EXT:1+ pedal edema, improved LUE AVF, +B/T    Imaging: No results found.  Labs: BMET Recent  Labs  Lab 08/31/17 2009 09/01/17 0542 09/02/17 8003 09/03/17 0534 09/04/17 0800 09/05/17 0652 09/06/17 0450 09/07/17 0429  NA  --  143 143 141 141 144 142 142  K  --  4.2 4.2 4.0 4.0 3.7 3.5 3.4*  CL  --  109 102 104 105 105 101 101  CO2  --  24 27 26 27 28 29 29   GLUCOSE  --  78 315* 278* 323* 171* 315* 180*  BUN  --  45* 50* 54* 59* 59* 58* 58*  CREATININE  --  4.45* 4.61* 4.63* 4.22* 4.22* 4.04* 4.10*  CALCIUM  --  8.3* 8.6* 8.5* 8.6* 9.1 9.1 9.2  PHOS 4.4  --   --   --   --   --   --   --    CBC Recent Labs  Lab 08/31/17 2330  09/02/17 0628 09/03/17 0534 09/05/17 0652 09/06/17 0450  WBC 13.8*  --  11.3* 8.4 8.9 10.3  NEUTROABS 9.5*  --   --   --   --   --   HGB 6.8*   < > 7.3* 7.2* 7.5* 7.5*  HCT 23.0*   < > 24.5* 24.0* 25.6* 25.6*  MCV 73.2*  --  75.9* 75.2* 75.7* 75.7*  PLT 363  --  350 353 426* 441*   < > = values in this interval not displayed.    Medications:    . aspirin EC  81 mg Oral Daily  . atorvastatin  20 mg Oral Daily  . calcitRIOL  0.25 mcg Oral Q M,W,F  . cloNIDine  0.1 mg Oral Q24H  . darbepoetin (ARANESP) injection - NON-DIALYSIS  60 mcg Subcutaneous Q Fri-1800  . enoxaparin (LOVENOX) injection  30 mg Subcutaneous Q24H  . ezetimibe  10 mg Oral Daily  . gabapentin  100 mg Oral TID  . insulin aspart  0-15 Units Subcutaneous TID WC  . insulin aspart  0-5 Units Subcutaneous QHS  . insulin aspart  15 Units Subcutaneous TID WC  . insulin detemir  55 Units Subcutaneous BID  . isosorbide mononitrate  30 mg Oral Daily  . labetalol  400 mg Oral BID  . pantoprazole  40 mg Oral Daily  . potassium chloride  40 mEq Oral Daily  . sodium chloride flush  3 mL Intravenous Q12H  . torsemide  100 mg Oral BID      Madelon Lips, MD 09/07/2017, 1:13 PM

## 2017-09-07 NOTE — Progress Notes (Signed)
Physical Therapy Treatment Patient Details Name: Kathryn Beck MRN: [id number] DOB: 07/18/1955 Today's Date: 09/07/2017    History of Present Illness Pt is a 62 y.o. female admitted 08/31/17 with SOB and BLE swelling.  Worked up for progressive CKD stage V, suspected diabetic nephropathy with nephrotic-range proteinuria with now ESRD; plan to start peritoneal dialysis following discharge. L brachiocephalic fistula placed 9/79. PMH includes HTN, diastolic CHF, DM2, anemia, CKD, foot sx.   PT Comments    Pt slowly progressing with mobility. Required minA to stand and take pivotal steps to recliner with RW; significantly fatigued after this minimal activity. Continues to c/o BLE pain limiting mobility. Educ on BLE therex while seated in recliner as pt declining ambulation this session. Discussed recommendation for short-term SNF-level therapies prior to return home due to continued limitations with functional mobility; pt and family agreeable to this. Discharged recommendations update to reflect this. Will continue to follow acutely.   Follow Up Recommendations  SNF;Supervision for mobility/OOB     Equipment Recommendations  Rolling walker with 5" wheels    Recommendations for Other Services       Precautions / Restrictions Precautions Precautions: Fall Restrictions Weight Bearing Restrictions: No    Mobility  Bed Mobility Overal bed mobility: Needs Assistance Bed Mobility: Supine to Sit     Supine to sit: Min guard        Transfers Overall transfer level: Needs assistance Equipment used: Rolling walker (2 wheeled) Transfers: Sit to/from Stand Sit to Stand: Min assist         General transfer comment: Pt reports significant increased effort to stand and transfer to recliner  Ambulation/Gait             General Gait Details: Pt just transferred to recliner with minA from NT, declining further mobility/ambulation secondary to fatigue and BLE pain   Stairs              Wheelchair Mobility    Modified Rankin (Stroke Patients Only)       Balance Overall balance assessment: Needs assistance   Sitting balance-Leahy Scale: Good       Standing balance-Leahy Scale: Poor Standing balance comment: Reliant on UE support                            Cognition Arousal/Alertness: Awake/alert Behavior During Therapy: WFL for tasks assessed/performed Overall Cognitive Status: Within Functional Limits for tasks assessed                                        Exercises General Exercises - Lower Extremity Ankle Circles/Pumps: AROM;Both;10 reps Other Exercises Other Exercises: Reviewed ankle pumps, heel slides, LAQ, seated marching to perform throughout day (family present for education)    General Comments General comments (skin integrity, edema, etc.): Pt and family agreeable to SNF      Pertinent Vitals/Pain Pain Assessment: Faces Faces Pain Scale: Hurts whole lot Pain Location: BLE Pain Descriptors / Indicators: Aching;Throbbing Pain Intervention(s): Monitored during session;Limited activity within patient's tolerance    Home Living                      Prior Function            PT Goals (current goals can now be found in the care plan section) Acute Rehab PT Goals Patient Stated  Goal: Get stronger with rehab before returning home; less BLE pain PT Goal Formulation: With patient Time For Goal Achievement: 09/14/17 Potential to Achieve Goals: Good Progress towards PT goals: Progressing toward goals    Frequency    Min 2X/week      PT Plan Discharge plan needs to be updated;Frequency needs to be updated    Co-evaluation              AM-PAC PT "6 Clicks" Daily Activity  Outcome Measure  Difficulty turning over in bed (including adjusting bedclothes, sheets and blankets)?: A Little Difficulty moving from lying on back to sitting on the side of the bed? : A Little Difficulty  sitting down on and standing up from a chair with arms (e.g., wheelchair, bedside commode, etc,.)?: Unable Help needed moving to and from a bed to chair (including a wheelchair)?: A Little Help needed walking in hospital room?: A Lot Help needed climbing 3-5 steps with a railing? : A Lot 6 Click Score: 14    End of Session Equipment Utilized During Treatment: Gait belt Activity Tolerance: Patient limited by pain;Patient limited by fatigue Patient left: in chair;with call bell/phone within reach;with nursing/sitter in room;with family/visitor present Nurse Communication: Mobility status PT Visit Diagnosis: Pain;Muscle weakness (generalized) (M62.81) Pain - part of body: Leg     Time: 6067-7034 PT Time Calculation (min) (ACUTE ONLY): 12 min  Charges:  $Therapeutic Exercise: 8-22 mins                    G Codes:      Mabeline Caras, PT, DPT Acute Rehab Services  Pager: [place] 09/07/2017, 10:02 AM

## 2017-09-08 ENCOUNTER — Encounter (HOSPITAL_COMMUNITY): Payer: Self-pay

## 2017-09-08 ENCOUNTER — Encounter (HOSPITAL_COMMUNITY): Payer: 59

## 2017-09-08 ENCOUNTER — Inpatient Hospital Stay
Admission: RE | Admit: 2017-09-08 | Discharge: 2017-09-16 | Disposition: A | Discharge status: Nursing Home (Includes ICF) | Payer: 59 | Source: Ambulatory Visit | Attending: Internal Medicine | Admitting: Internal Medicine

## 2017-09-08 LAB — CBC
HEMATOCRIT: 28.5 % — AB (ref 36.0–46.0)
Hemoglobin: 8.3 g/dL — ABNORMAL LOW (ref 12.0–15.0)
MCH: 22.4 pg — ABNORMAL LOW (ref 26.0–34.0)
MCHC: 29.1 g/dL — ABNORMAL LOW (ref 30.0–36.0)
MCV: 77 fL — ABNORMAL LOW (ref 78.0–100.0)
PLATELETS: 489 10*3/uL — AB (ref 150–400)
RBC: 3.7 MIL/uL — ABNORMAL LOW (ref 3.87–5.11)
RDW: 17.3 % — AB (ref 11.5–15.5)
WBC: 10.6 10*3/uL — AB (ref 4.0–10.5)

## 2017-09-08 LAB — BASIC METABOLIC PANEL
Anion gap: 13 (ref 5–15)
BUN: 61 mg/dL — AB (ref 6–20)
CALCIUM: 9 mg/dL (ref 8.9–10.3)
CO2: 28 mmol/L (ref 22–32)
CREATININE: 4.33 mg/dL — AB (ref 0.44–1.00)
Chloride: 98 mmol/L — ABNORMAL LOW (ref 101–111)
GFR calc Af Amer: 12 mL/min — ABNORMAL LOW (ref >60)
GFR, EST NON AFRICAN AMERICAN: 10 mL/min — AB (ref >60)
GLUCOSE: 155 mg/dL — AB (ref 65–99)
POTASSIUM: 3.8 mmol/L (ref 3.5–5.1)
SODIUM: 139 mmol/L (ref 135–145)

## 2017-09-08 LAB — GLUCOSE, CAPILLARY
GLUCOSE-CAPILLARY: 236 mg/dL — AB (ref 65–99)
GLUCOSE-CAPILLARY: 332 mg/dL — AB (ref 65–99)
Glucose-Capillary: 137 mg/dL — ABNORMAL HIGH (ref 65–99)

## 2017-09-08 MED ORDER — ONDANSETRON 4 MG PO TBDP: 4.0000 mg | ORAL_TABLET | Freq: Three times a day (TID) | ORAL | 0 refills | Status: DC | PRN

## 2017-09-08 MED ORDER — POTASSIUM CHLORIDE 20 MEQ PO PACK: 40.0000 meq | PACK | Freq: Every day | ORAL | 4 refills | Status: DC

## 2017-09-08 MED ORDER — INSULIN ASPART 100 UNIT/ML ~~LOC~~ SOLN: 0.0000 [IU] | Freq: Three times a day (TID) | SUBCUTANEOUS | Status: DC

## 2017-09-08 MED ORDER — INSULIN ASPART 100 UNIT/ML ~~LOC~~ SOLN: 15.0000 [IU] | Freq: Three times a day (TID) | SUBCUTANEOUS | 11 refills | Status: DC

## 2017-09-08 MED ORDER — INSULIN ASPART 100 UNIT/ML ~~LOC~~ SOLN
15.0000 [IU] | Freq: Three times a day (TID) | SUBCUTANEOUS | Status: DC
  Administered 2017-09-08 (×2): 15 [IU] via SUBCUTANEOUS

## 2017-09-08 MED ORDER — DARBEPOETIN ALFA 100 MCG/0.5ML IJ SOSY: 100.0000 ug | PREFILLED_SYRINGE | INTRAMUSCULAR | Status: DC

## 2017-09-08 MED ORDER — GABAPENTIN 100 MG PO CAPS: 100.0000 mg | ORAL_CAPSULE | Freq: Two times a day (BID) | ORAL | 3 refills | Status: DC

## 2017-09-08 MED ORDER — HYDROXYZINE HCL 25 MG PO TABS: 25.0000 mg | ORAL_TABLET | Freq: Three times a day (TID) | ORAL | 0 refills | Status: DC | PRN

## 2017-09-08 MED ORDER — TORSEMIDE 100 MG PO TABS: 100.0000 mg | ORAL_TABLET | Freq: Two times a day (BID) | ORAL | 4 refills | Status: DC

## 2017-09-08 MED ORDER — INSULIN ASPART 100 UNIT/ML ~~LOC~~ SOLN: 0.0000 [IU] | Freq: Three times a day (TID) | SUBCUTANEOUS | 11 refills | Status: DC

## 2017-09-08 MED ORDER — INSULIN ASPART 100 UNIT/ML ~~LOC~~ SOLN
0.0000 [IU] | Freq: Three times a day (TID) | SUBCUTANEOUS | Status: DC
  Administered 2017-09-08: 11 [IU] via SUBCUTANEOUS
  Administered 2017-09-08: 2 [IU] via SUBCUTANEOUS
  Administered 2017-09-08: 5 [IU] via SUBCUTANEOUS

## 2017-09-08 MED ORDER — INSULIN ASPART 100 UNIT/ML ~~LOC~~ SOLN: 0.0000 [IU] | Freq: Every day | SUBCUTANEOUS | 11 refills | Status: DC

## 2017-09-08 MED ORDER — METOLAZONE 5 MG PO TABS: ORAL_TABLET | ORAL | 1 refills | Status: DC

## 2017-09-08 MED ORDER — CAMPHOR-MENTHOL 0.5-0.5 % EX LOTN: 1.0000 "application " | TOPICAL_LOTION | Freq: Three times a day (TID) | CUTANEOUS | 0 refills | Status: DC | PRN

## 2017-09-08 MED ORDER — INSULIN DETEMIR 100 UNIT/ML ~~LOC~~ SOLN: 55.0000 [IU] | Freq: Two times a day (BID) | SUBCUTANEOUS | 11 refills | Status: DC

## 2017-09-08 MED ORDER — NEPRO/CARBSTEADY PO LIQD: 237.0000 mL | Freq: Three times a day (TID) | ORAL | 0 refills | Status: DC | PRN

## 2017-09-08 MED ORDER — CALCIUM CARBONATE ANTACID 1250 MG/5ML PO SUSP: 500.0000 mg | Freq: Four times a day (QID) | ORAL | 1 refills | Status: DC | PRN

## 2017-09-08 MED ORDER — SENNOSIDES-DOCUSATE SODIUM 8.6-50 MG PO TABS: 2.0000 | ORAL_TABLET | Freq: Every evening | ORAL | 1 refills | Status: DC | PRN

## 2017-09-08 MED ORDER — ASPIRIN 81 MG PO TBEC: 81.0000 mg | DELAYED_RELEASE_TABLET | Freq: Every day | ORAL | 1 refills | Status: DC

## 2017-09-08 NOTE — Clinical Social Work Note (Addendum)
Received call from patient's daughter. Patient told her [facility] is full. They have offered a bed so CSW left voicemail for admissions coordinator to check on bed availability.  Dayton Scrape, [place] [phone]  11:30 am [facility] does have beds available and will start insurance authorization. CSW called and updated patient's daughter.  Dayton Scrape, [place] [phone]  1:14 pm Patient's insurance company has concerns about patient refusing PT yesterday and need more information on ADL's. This information can be provided by PT rather than doing an OT evaluation. CSW paged PT that has worked with patient multiple times to see if he can provide this information.  Dayton Scrape, [place]  2:04 pm Correction: insurance company has concerns about patient refusing to ambulate with PT yesterday. CSW spoke with that physical therapist. She will have physical therapist that has worked with patient more call CSW.  Dayton Scrape, [place] [phone]  2:33 pm Spoke with physical therapist. He will call SNF admissions coordinator to answer questions insurance company had about ADL's. CSW will follow up with SNF later.  Dayton Scrape, [place] [phone]  3:53 pm Insurance authorization approved. SNF needs discharge summary by 4:30 pm. MD aware. CSW updated patient, daughter at bedside, and daughter Donald Prose by phone. Patient will need PTAR.  Dayton Scrape, [place]

## 2017-09-08 NOTE — Clinical Social Work Note (Signed)
CSW facilitated patient discharge including contacting patient family and facility to confirm patient discharge plans. Clinical information faxed to facility and family agreeable with plan. CSW arranged ambulance transport via [place] to [facility] at 7:45 pm per family request. Daughter and husband want to be here when she transfers. RN to call report prior to discharge [phone]).  CSW will sign off for now as social work intervention is no longer needed. Please consult [place] again if new needs arise.  Kathryn Beck, [place]

## 2017-09-08 NOTE — Discharge Instructions (Signed)
1)Very low-salt diet advised 2)Weigh yourself daily, call if you gain more than 3 pounds in 1 day or more than 5 pounds in 1 week as your diuretic medications may need to be adjusted 3)Limit your Fluid  intake to no more than 60 ounces (1.8 Liters) per day 4)Avoid ibuprofen/Advil/Aleve/Motrin/Goody Powders/Naproxen/BC powders/Meloxicam/Diclofenac/Indomethacin and other Nonsteroidal anti-inflammatory medications as these will make you more likely to bleed and can cause stomach ulcers, can also cause Kidney problems.  5)Repeat BMP on Friday 09/10/17 6)Follow-up with Nephrologist Dr. Joelyn Oms next week as previously advised 7)Epo Agent (Aranesp) as outpatient as per nephrologist

## 2017-09-08 NOTE — Clinical Social Work Placement (Signed)
   CLINICAL SOCIAL WORK PLACEMENT  NOTE  Date:  09/08/2017  Patient Details  Name: Kathryn Beck MRN: [id number] Date of Birth: 07/19/1955  Clinical Social Work is seeking post-discharge placement for this patient at the [place] level of care (*CSW will initial, date and re-position this form in  chart as items are completed):  Yes   Patient/family provided with [place] Work Department's list of facilities offering this level of care within the geographic area requested by the patient (or if unable, by the patient's family).  Yes   Patient/family informed of their freedom to choose among providers that offer the needed level of care, that participate in Medicare, Medicaid or managed care program needed by the patient, have an available bed and are willing to accept the patient.  Yes   Patient/family informed of [place]'s ownership interest in [facility] and [facility], as well as of the fact that they are under no obligation to receive care at these facilities.  PASRR submitted to EDS on 09/07/17     PASRR number received on 09/07/17     Existing PASRR number confirmed on       FL2 transmitted to all facilities in geographic area requested by pt/family on 09/07/17     FL2 transmitted to all facilities within larger geographic area on       Patient informed that his/her managed care company has contracts with or will negotiate with certain facilities, including the following:        Yes   Patient/family informed of bed offers received.  Patient chooses bed at [facility]     Physician recommends and patient chooses bed at      Patient to be transferred to [facility] on 09/08/17.  Patient to be transferred to facility by PTAR     Patient family notified on 09/08/17 of transfer.  Name of family member notified:  Dagoberto Ligas     PHYSICIAN Please prepare prescriptions     Additional Comment:     _______________________________________________ Candie Chroman, LCSW 09/08/2017, 4:41 PM

## 2017-09-08 NOTE — Progress Notes (Signed)
[facility] Progress Note    Assessment/ Plan:   1. 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  She wishes to do PD and to follow with Dr. Joelyn Oms. pt has number for HT, dtr has already been in contact and they have appt on Friday.  No immediate need for dialysis right now.  If she does go to SNF will need to wait until at home for initiation of PD, so may need HD in the interim if progresses to needing dialysis as OP.  Cr 4.1--4.3.  Doing OK on PO diuretics- has close f/u with Dr. Joelyn Oms per dtr, will confirm.  Hopeful discharge today/ tomorrow. 2. Hypervoelmia / LEE / SOB / Pulm Edema likely primarily from #1-  transitioning to torsemide 100 BID with metolazone 5 mg. 3. Anemia, microcytic, was told had IDA - s/p 510mg  IV Fereheme 09/01/17 + 6/15; started ESA 6/14 4. HTN on CCB and clonidine daily 5. Recent CAP of LLL s/p levofloxacin 6. ? 2HPTH, on C3; PTH 128, P WNL 7. GERD 8. S/p LUE BC AVF 09/02/17 9. ? M Spike on UPEP at outpt [facility] eval, repeat SPEP here with M-spike 0.2 and expected K/L light chain ratio in CKD--> can do OP eval.   10. Dispo: pending transition to oral diuretics- OK from our perspective to d/c with close followup     Subjective:    Adequate response to PO diuretics. Cr essentially the same.  Dtr updated via facetime today- hoping to go to SNF for a week or so with PT.  Per dtr, has appt with Dr. Joelyn Oms next Monday and HT this coming Friday. Pt still having some leg pain.   Objective:   BP 139/66 (BP Location: Right Arm)   Pulse 79   Temp 98.3 F (36.8 C) (Oral)   Resp 18   Ht 5\' 4"  (1.626 m)   Wt 91.6 kg (201 lb 15.1 oz)   SpO2 96%   BMI 34.66 kg/m   Intake/Output Summary (Last 24 hours) at 09/08/2017 1105 Last data filed at 09/08/2017 1038 Gross per 24 hour  Intake 966 ml  Output 1725 ml  Net -759 ml   Weight change: 0 kg (0 lb)  Physical Exam: HYQ:MVHQIONGEXB ill appearing, NAD, lying in bed MWU:XLKG dentition  NAD EYES:EOMI CV:RRR no rub, nl s1s2 PULM:clear bilaterally, off O2  ABD:s/nt/nd SKIN:no rashes/lesions MWN:UUVOZ pedal edema, improved LUE AVF, +B/T    Imaging: No results found.  Labs: BMET Recent Labs  Lab 09/02/17 0628 09/03/17 0534 09/04/17 0800 09/05/17 0652 09/06/17 0450 09/07/17 0429 09/08/17 0454  NA 143 141 141 144 142 142 139  K 4.2 4.0 4.0 3.7 3.5 3.4* 3.8  CL 102 104 105 105 101 101 98*  CO2 27 26 27 28 29 29 28   GLUCOSE 315* 278* 323* 171* 315* 180* 155*  BUN 50* 54* 59* 59* 58* 58* 61*  CREATININE 4.61* 4.63* 4.22* 4.22* 4.04* 4.10* 4.33*  CALCIUM 8.6* 8.5* 8.6* 9.1 9.1 9.2 9.0   CBC Recent Labs  Lab 09/03/17 0534 09/05/17 0652 09/06/17 0450 09/08/17 0454  WBC 8.4 8.9 10.3 10.6*  HGB 7.2* 7.5* 7.5* 8.3*  HCT 24.0* 25.6* 25.6* 28.5*  MCV 75.2* 75.7* 75.7* 77.0*  PLT 353 426* 441* 489*    Medications:    . aspirin EC  81 mg Oral Daily  . atorvastatin  20 mg Oral Daily  . calcitRIOL  0.25 mcg Oral Q M,W,F  . cloNIDine  0.1 mg Oral Q24H  . [  START ON 09/10/2017] darbepoetin (ARANESP) injection - NON-DIALYSIS  100 mcg Subcutaneous Q Fri-1800  . enoxaparin (LOVENOX) injection  30 mg Subcutaneous Q24H  . ezetimibe  10 mg Oral Daily  . gabapentin  100 mg Oral TID  . insulin aspart  0-15 Units Subcutaneous TID WC  . insulin aspart  0-5 Units Subcutaneous QHS  . insulin aspart  15 Units Subcutaneous TID WC  . insulin detemir  55 Units Subcutaneous BID  . isosorbide mononitrate  30 mg Oral Daily  . labetalol  400 mg Oral BID  . pantoprazole  40 mg Oral Daily  . potassium chloride  40 mEq Oral Daily  . sodium chloride flush  3 mL Intravenous Q12H  . torsemide  100 mg Oral BID      Madelon Lips, MD 09/08/2017, 11:05 AM

## 2017-09-08 NOTE — Discharge Summary (Signed)
Kathryn Beck, is a 62 y.o. female  DOB 01-19-1956  MRN [id number].  Admission date:  08/31/2017  Admitting Physician  Karmen Bongo, MD  Discharge Date:  09/08/2017   Primary MD  Fayrene Helper, MD  Recommendations for primary care physician for things to follow:   1)Very low-salt diet advised 2)Weigh yourself daily, call if you gain more than 3 pounds in 1 day or more than 5 pounds in 1 week as your diuretic medications may need to be adjusted 3)Limit your Fluid  intake to no more than 60 ounces (1.8 Liters) per day 4)Avoid ibuprofen/Advil/Aleve/Motrin/Goody Powders/Naproxen/BC powders/Meloxicam/Diclofenac/Indomethacin and other Nonsteroidal anti-inflammatory medications as these will make you more likely to bleed and can cause stomach ulcers, can also cause Kidney problems.  5)Repeat BMP on Friday 09/10/17 6)Follow-up with Nephrologist Dr. Joelyn Oms next week as previously advised 7)Epo Agent (Aranesp) as outpatient as per nephrologist   Admission Diagnosis  Acute on chronic diastolic congestive heart failure ([place]) [I50.33] AKI (acute kidney injury) ([place]) [N17.9]   Discharge Diagnosis  Acute on chronic diastolic congestive heart failure ([place]) [I50.33] AKI (acute kidney injury) ([place]) [N17.9]    Principal Problem:   CKD (chronic kidney disease) stage 5, GFR less than 15 ml/min (HCC) Active Problems:   Uncontrolled diabetes mellitus with diabetic nephropathy, with long-term current use of insulin (HCC)   HLD (hyperlipidemia)   Sleep apnea   Anemia of chronic disease   Acute exacerbation of CHF (congestive heart failure) ([place])   Edema due to hypoalbuminemia      Past Medical History:  Diagnosis Date  . Acid reflux   . Anemia   . Arthritis   . Axillary masses    Soft tissue - status post excision  . Back pain   . Chronic kidney disease   . Diastolic heart failure ([place])   . Essential  hypertension   . Ischemic heart disease    Abnormal Myoview April 2018 - medical therapy  . Mixed hyperlipidemia   . Obesity   . Pancreatitis   . Sleep apnea    Noncompliant with CPAP  . Type 2 diabetes mellitus, uncontrolled ([place])     Past Surgical History:  Procedure Laterality Date  . ABDOMINAL HYSTERECTOMY    . AV FISTULA PLACEMENT Left 09/02/2017   Procedure: creation of left arm ARTERIOVENOUS (AV) FISTULA;  Surgeon: Serafina Mitchell, MD;  Location: [facility] OR;  Service: Vascular;  Laterality: Left;  . COLONOSCOPY  2008   Dr. Oneida Alar: normal   . COLONOSCOPY N/A 12/18/2016   Dr. Oneida Alar: multiple tubular adenomas, internal hemorrhoids. Surveillance in 3 years   . ESOPHAGEAL DILATION N/A 10/13/2015   Procedure: ESOPHAGEAL DILATION;  Surgeon: Rogene Houston, MD;  Location[facility] END[facility] SU[facility];  Service: Endoscopy;  Laterality: N/A;  . ESOPHAGOGASTRODUODENOSCOPY N/A 10/13/2015   Dr. Laural Golden: chronic gastritis on path, no H.pylori. Empiric dilation   . ESOPHAGOGASTRODUODENOSCOPY N/A 12/18/2016   Dr. Oneida Alar: mild gastritis. BRAVO study revealed uncontrolled GERD. Dysphagia secondary to uncontrolled reflux  . FOOT SURGERY  Bilateral   . LUNG BIOPSY    . MASS EXCISION Right 01/09/2013   Procedure: EXCISION OF NEOPLASM OF RIGHT  AXILLA  AND EXCISION OF NEOPLASM OF LEFT AXILLA;  Surgeon: Jamesetta So, MD;  Location: [facility] ORS;  Service: General;  Laterality: Right;  procedure end @ 08:23  . MYRINGOTOMY WITH TUBE PLACEMENT Bilateral 04/28/2017   Procedure: BILATERAL MYRINGOTOMY WITH TUBE PLACEMENT;  Surgeon: Leta Baptist, MD;  Location: [facility];  Service: ENT;  Laterality: Bilateral;  . SAVORY DILATION N/A 12/18/2016   Procedure: SAVORY DILATION;  Surgeon: Danie Binder, MD;  Location: [facility];  Service: Endoscopy;  Laterality: N/A;       HPI  from the history and physical done on the day of admission:     Chief Complaint:  Edema, SOB  HPI: Kathryn Beck is a 62 y.o. female with medical history  significant of DM; OSA not on CPAP; obesity; HLD: HTN; and CKD presenting with SOB.   LE edema, very sore, unable to bear weight.  Initially they went to the ER and were hospitalized through the end of May, treated for PNA.  She was d/c on 5/31 and she awoke the next AM with "even more pain" in her right chest, side, and flank.  They went to [facility] and she was discharged; blood work showed >300 proteinuria; 250 glycosuria; small hematuria; WBC 12.2; Hgb 8.5; Plt 483; BUN 34/creatinine 3.91/GFR 14.  She went to Dr. Moshe Cipro that Monday and had blood work and she was sent to Dr. Lowanda Foster.  She saw him that Wednesday and he increased Lasix to 120 mg in the AM and 80 in the evening.  She was able to bear weight at that point, although she did have edema.  He also ordered IV iron and hematology appointment.  Since then, edema is progressive.  She has an appt for IV iron tomorrow in [place].  She is now no longer able to walk, more SOB.  No cough.  She is excessively fatigued.  Poor appetite.   ED Course:  Very difficult diuresis.  Cardiology is consulting.  H/o diastolic CHF with worsening SOB - received IVF and abx in hospital.  Martin Majestic to [facility] - edema, SOB, increased Lasix but worsening edema and SOB.  Creatinine worse, 4 from 3.5.  Cardiology is consulting.  They will dose the Lasix.  They will also consult nephrology.        Hospital Course:    Brief Narrative: [name] a 62 y.o.femalewith medical history significant ofT2DM, Stage IV CKD, chronic HFpEF, HTN, HLD, OSA not compliant with CPAP, morbid obesity whot presented to the ED for dyspnea and leg swelling for the past 2 weeks following recent admission for pneumonia (got IVF's, discharged 5/31). She was evaluated by her Nephrologist (Dr. Lowanda Foster) on 08/25/17 and her po lasix was increased to 120mg  qAM and 80mg  qPM. She states her weight has remained stable if not slightly decreased since that visit (218lbs this AM), however her LE  edema has progressed, making it difficult to ambulate. Evaluation showed creatinine 4, having steadily increased. Nephrology was consulted and after discussion with patient the decision was made to initiate dialysis. LUE AVF was placed 6/13 as back up to the patient's preferred method, peritoneal dialysis. Outpatient nephrology reported an M spike in urine, so SPEP and light chains were sent and pending. High dose IV lasix has been required for diuresis with slow improvement now that metolazone has been added. Plan is  to initiate PD following discharge once she's stable on a po regimen. With increased swelling in the legs, the patient has been much less mobile and seems to have grown deconditioned to the point the SNF rehabilitation may be necessary prior to return home   Plan:- 1) acute on chronic kidney disease with progressive nephrotic CKD 4-5- presumed to be secondary to diabetic nephropathy with nephrotic range proteinuria, left arm AV fistula placed, plan is for peritoneal dialysis down the road, should patient decompensate in the meantime she may need hemodialysis as outpatient.  Patient to follow-up with Dr. Joelyn Oms on Friday, nephrologist recommends torsemide 100 mg twice daily and metolazone 5 mg as ordered  2) chronic microcytic anemia----history of iron deficiency,, treated with IV Fereheme, received Aranesp on 09/03/2017, Hgb stable around 8  3)HTN-improved, continue labetalol 400 mg twice daily, isosorbide 30 mg daily, clonidine 0.1 mg nightly and diuretics as above  4)HFpEF--acute on chronic diastolic CHF exacerbation, last known EF is 55 to 60% with grade 1 diastolic dysfunction, echo without wall motion abnormalities, volume overload status was addressed with diuretics, nephrologist advised discharge on torsemide 100 mg twice daily along with metolazone 5 mg as ordered  5)S/p LUE BC AVF 09/02/17 by Dr Trula Slade    6)DM2--uncontrolled type 2 diabetes, A1c 11, Levemir adjusted to 55 units  twice daily, give NovoLog insulin 15 units 3 times daily with meals along with sliding scale coverage  7)? M Spike on UPEP at outpt [place] eval, repeat SPEP here with M-spike 0.2 and expected K/L light chain ratio in CKD--> can do OP eval.   8)Dyslipidemia--continue Lipitor 20 mg daily, Zetia and aspirin 81 mg daily   Consults- Nephrology, vascular surgery and cardiology   Discharge Condition: Stable  Follow UP   Contact information for follow-up providers    Serafina Mitchell, MD Follow up in 6 week(s).   Specialties:  Vascular Surgery, Cardiology Contact information: [address] [place] [address] [phone]            Contact information for after-discharge care    [facility] .   Service:  Skilled Nursing Contact information: [address] [place] [address] [phone]                   Diet and Activity recommendation:  As advised, renal diet, carb modified  Discharge Instructions     Discharge Instructions    (HEART FAILURE PATIENTS) Call MD:  Anytime you have any of the following symptoms: 1) 3 pound weight gain in 24 hours or 5 pounds in 1 week 2) shortness of breath, with or without a dry hacking cough 3) swelling in the hands, feet or stomach 4) if you have to sleep on extra pillows at night in order to breathe.   Complete by:  As directed    Ambulatory referral to Nutrition and Diabetic Education   Complete by:  As directed    Call MD for:  difficulty breathing, headache or visual disturbances   Complete by:  As directed    Call MD for:  persistant dizziness or light-headedness   Complete by:  As directed    Call MD for:  persistant nausea and vomiting   Complete by:  As directed    Call MD for:  severe uncontrolled pain   Complete by:  As directed    Call MD for:  temperature >100.4   Complete by:  As directed  Diet - low sodium heart healthy   Complete by:  As directed     Discharge instructions   Complete by:  As directed    1)Very low-salt diet advised 2)Weigh yourself daily, call if you gain more than 3 pounds in 1 day or more than 5 pounds in 1 week as your diuretic medications may need to be adjusted 3)Limit your Fluid  intake to no more than 60 ounces (1.8 Liters) per day 4)Avoid ibuprofen/Advil/Aleve/Motrin/Goody Powders/Naproxen/BC powders/Meloxicam/Diclofenac/Indomethacin and other Nonsteroidal anti-inflammatory medications as these will make you more likely to bleed and can cause stomach ulcers, can also cause Kidney problems.  5)Repeat BMP on Friday 09/10/17 6)Follow-up with Nephrologist Dr. Joelyn Oms next week as previously advised 7)Epo Agent (Aranesp) as outpatient as per nephrologist   Increase activity slowly   Complete by:  As directed         Discharge Medications     Allergies as of 09/08/2017      Reactions   Ace Inhibitors Anaphylaxis, Swelling   Penicillins Itching, Swelling, Other (See Comments)   SWELLING REACTION UNSPECIFIED  PATIENT HAS HAD A PCN REACTION WITH IMMEDIATE RASH, FACIAL/TONGUE/THROAT SWELLING, SOB, OR LIGHTHEADEDNESS WITH HYPOTENSION:  #  #  YES  #  #  Has patient had a PCN reaction causing severe rash involving mucus membranes or skin necrosis: No Has patient had a PCN reaction that required hospitalization No Has patient had a PCN reaction occurring within the last 10 years: No If all of the above answers are "NO", then may proceed with Cephalosporin use.   Statins Other (See Comments)   elevated LFT's   Albuterol Swelling   SWELLING REACTION UNSPECIFIED       Medication List    STOP taking these medications   amLODipine 10 MG tablet Commonly known as:  NORVASC   furosemide 40 MG tablet Commonly known as:  LASIX   Insulin Lispro 200 UNIT/ML Sopn Commonly known as:  HUMALOG KWIKPEN   olmesartan 40 MG tablet Commonly known as:  BENICAR     TAKE these medications   acetaminophen 500 MG  tablet Commonly known as:  TYLENOL Take 1,000 mg by mouth every 6 (six) hours as needed for moderate pain or headache.   albuterol 108 (90 Base) MCG/ACT inhaler Commonly known as:  PROVENTIL HFA;VENTOLIN HFA Inhale 2 puffs into the lungs every 4 (four) hours as needed for wheezing or shortness of breath.   aspirin 81 MG EC tablet Take 1 tablet (81 mg total) by mouth daily. With Food Start taking on:  09/09/2017   atorvastatin 20 MG tablet Commonly known as:  LIPITOR Take 1 tablet (20 mg total) by mouth daily.   azelastine 0.1 % nasal spray Commonly known as:  ASTELIN Place 2 sprays into both nostrils 2 (two) times daily. Use in each nostril as directed What changed:    when to take this  reasons to take this  additional instructions   calcitRIOL 0.25 MCG capsule Commonly known as:  ROCALTROL Take 0.25 mcg by mouth every Monday, Wednesday, and Friday.   calcium carbonate (dosed in mg elemental calcium) 1250 MG/5ML Susp Take 5 mLs (500 mg of elemental calcium total) by mouth every 6 (six) hours as needed for indigestion.   camphor-menthol lotion Commonly known as:  SARNA Apply 1 application topically every 8 (eight) hours as needed for itching.   cloNIDine 0.1 MG tablet Commonly known as:  CATAPRES TAKE 1 TABLET BY MOUTH  EVERY EVENING AT 9PM FOR  BLOOD  PRESSURE What changed:    how much to take  how to take this  when to take this  additional instructions   dexlansoprazole 60 MG capsule Commonly known as:  DEXILANT 1 PO EVERY MORNING WITH BREAKFAST. What changed:    how much to take  how to take this  when to take this  additional instructions   dextromethorphan-guaiFENesin 30-600 MG 12hr tablet Commonly known as:  MUCINEX DM Take 1 tablet by mouth 2 (two) times daily. What changed:  when to take this   dicyclomine 10 MG capsule Commonly known as:  BENTYL 1 po qachs if needed to prevent diarrhea and abdominal cramps What changed:    how much to  take  how to take this  when to take this  reasons to take this  additional instructions   ezetimibe 10 MG tablet Commonly known as:  ZETIA Take 1 tablet (10 mg total) by mouth daily.   feeding supplement (NEPRO CARB STEADY) Liqd Take 237 mLs by mouth 3 (three) times daily as needed (Supplement).   gabapentin 100 MG capsule Commonly known as:  NEURONTIN Take 1 capsule (100 mg total) by mouth 2 (two) times daily. What changed:  when to take this   hydrOXYzine 25 MG tablet Commonly known as:  ATARAX/VISTARIL Take 1 tablet (25 mg total) by mouth every 8 (eight) hours as needed for itching.   insulin aspart 100 UNIT/ML injection Commonly known as:  novoLOG Inject 0-15 Units into the skin 3 (three) times daily with meals.   insulin aspart 100 UNIT/ML injection Commonly known as:  novoLOG Inject 0-5 Units into the skin at bedtime.   insulin aspart 100 UNIT/ML injection Commonly known as:  novoLOG Inject 15 Units into the skin 3 (three) times daily with meals.   insulin detemir 100 UNIT/ML injection Commonly known as:  LEVEMIR Inject 0.55 mLs (55 Units total) into the skin 2 (two) times daily. What changed:    how much to take  when to take this   isosorbide mononitrate 30 MG 24 hr tablet Commonly known as:  IMDUR Take 1 tablet (30 mg total) by mouth daily.   labetalol 200 MG tablet Commonly known as:  NORMODYNE Take 2 tablets (400 mg total) by mouth 2 (two) times daily.   methocarbamol 500 MG tablet Commonly known as:  ROBAXIN Take 1 tablet (500 mg total) by mouth every 8 (eight) hours as needed for muscle spasms.   metolazone 5 MG tablet Commonly known as:  ZAROXOLYN Take 1 tablet 5 mg every Monday, Wednesday, Friday and Sundays   ondansetron 4 MG disintegrating tablet Commonly known as:  ZOFRAN ODT Take 1 tablet (4 mg total) by mouth every 8 (eight) hours as needed for nausea or vomiting.   potassium chloride 20 MEQ packet Commonly known as:   KLOR-CON Take 40 mEq by mouth daily. Start taking on:  09/09/2017   senna-docusate 8.6-50 MG tablet Commonly known as:  Senokot-S Take 2 tablets by mouth at bedtime as needed for mild constipation or moderate constipation.   torsemide 100 MG tablet Commonly known as:  DEMADEX Take 1 tablet (100 mg total) by mouth 2 (two) times daily.   Vitamin D (Ergocalciferol) 50000 units Caps capsule Commonly known as:  DRISDOL Take 1 capsule (50,000 Units total) by mouth every 7 (seven) days. What changed:  additional instructions       Major procedures and Radiology Reports - PLEASE review detailed and final reports for all details, in brief -  Dg Chest 2 View  Result Date: 08/31/2017 CLINICAL DATA:  Recent diagnosis of pneumonia. Bilateral lower extremity swelling and pain. EXAM: CHEST - 2 VIEW COMPARISON:  Chest radiograph Aug 18, 2017 FINDINGS: Cardiac silhouette is mildly enlarged. Mediastinal silhouette is not suspicious. Mildly calcified aortic arch. Pulmonary vascular congestion and mild interstitial prominence. LEFT lung base pleural thickening without pleural effusion or focal consolidation. No pneumothorax. Soft tissue planes and included osseous structures are unchanged. IMPRESSION: Mild cardiomegaly. Mild interstitial prominence, possible pulmonary edema. Aortic Atherosclerosis (ICD10-I70.0). Electronically Signed   By: Elon Alas M.D.   On: 08/31/2017 14:12   Dg Chest 2 View  Result Date: 08/18/2017 CLINICAL DATA:  Shortness of breath, cough, chest soreness EXAM: CHEST - 2 VIEW COMPARISON:  08/17/2017 FINDINGS: Heart is borderline in size. Lingular linear atelectasis or scarring. Right lung clear. No effusions or acute bony abnormality. IMPRESSION: Lingular atelectasis or scarring. Electronically Signed   By: Rolm Baptise M.D.   On: 08/18/2017 11:53   Dg Chest 2 View  Result Date: 08/15/2017 CLINICAL DATA:  Cough, wheezing, SOB today EXAM: CHEST - 2 VIEW COMPARISON:   03/24/2017 FINDINGS: Ill-defined airspace opacities in the left mid lung and both lung bases left greater than right, increased since prior study. Heart size upper limits normal. No effusion. Visualized bones unremarkable. IMPRESSION: 1. Patchy bibasilar airspace opacities left greater than right, possibly early pneumonia. Consider follow-up to confirm appropriate resolution. Electronically Signed   By: Lucrezia Europe M.D.   On: 08/15/2017 10:42   Ct Chest Wo Contrast  Result Date: 08/18/2017 CLINICAL DATA:  Recent diagnosis of pneumonia with persistent symptoms. Shortness of breath. EXAM: CT CHEST WITHOUT CONTRAST TECHNIQUE: Multidetector CT imaging of the chest was performed following the standard protocol without IV contrast. COMPARISON:  Chest radiography same day. FINDINGS: Cardiovascular: Heart size upper limits of normal. No pericardial fluid. Minimal visible coronary artery calcification. Mild calcification in the region of the aortic valve and aortic arch. Mediastinum/Nodes: No mass or lymphadenopathy. Lungs/Pleura: The right lung is clear and normal. No pleural fluid on the right. The left lung shows mild patchy density in the left upper lobe, lingula and left lower lobe consistent with mild or resolving pneumonia. There is a small amount of pleural fluid on the left with mild dependent atelectasis. No finding to suggest underlying mass lesion. Upper Abdomen: Bilateral adrenal enlargement. This could represent adrenal hyperplasia or bilateral adrenal masses. These are unchanged from the previous abdominal CT of January 2019. Pancreatic head not included in the region studied. Musculoskeletal: Ordinary thoracic degenerative changes. IMPRESSION: Patchy density and volume loss in the left upper lobe, lingula and left lower lobe with a small left effusion. Findings would be consistent with resolving pneumonia. No specific features. Aortic atherosclerosis.  Mild coronary artery calcification. Bilateral adrenal  enlargement redemonstrated, not visibly changed since the abdominal CT of January. Electronically Signed   By: Nelson Chimes M.D.   On: 08/18/2017 14:40    Micro Results     Recent Results (from the past 240 hour(s))  Surgical pcr screen     Status: None   Collection Time: 09/01/17 11:05 PM  Result Value Ref Range Status   MRSA, PCR NEGATIVE NEGATIVE Final   Staphylococcus aureus NEGATIVE NEGATIVE Final    Comment: (NOTE) The Xpert SA Assay (FDA approved for NASAL specimens in patients 12 years of age and older), is one component of a comprehensive surveillance program. It is not intended to diagnose infection nor to guide or monitor  treatment. Performed at [facility], [place] [address], [place], [place] [address]        Today   Subjective    Tocara Mennen today has no new complaints, daughter at bedside, questions answered          Patient has been seen and examined prior to discharge   Objective   Blood pressure 112/64, pulse 75, temperature 98.2 F (36.8 C), temperature source Oral, resp. rate 18, height 5\' 4"  (1.626 m), weight 91.6 kg (201 lb 15.1 oz), SpO2 96 %.   Intake/Output Summary (Last 24 hours) at 09/08/2017 1630 Last data filed at 09/08/2017 1038 Gross per 24 hour  Intake 723 ml  Output 1725 ml  Net -1002 ml    Exam Gen:- Awake Alert,  In no apparent distress, speaking in complete sentences HEENT:- .AT, No sclera icterus Neck-Supple Neck,No JVD,.  Lungs-  CTAB , metrical and movement CV- S1, S2 normal Abd-  +ve B.Sounds, Abd Soft, No tenderness,    Extremity/Skin:-Upper extremity AV fistula area with thrill , lower extremities with trace to +1 pitting edema psych-affect is appropriate, oriented x3 Neuro-no new focal deficits, no tremors   Data Review   CBC w Diff:  Lab Results  Component Value Date   WBC 10.6 (H) 09/08/2017   HGB 8.3 (L) 09/08/2017   HCT 28.5 (L) 09/08/2017   PLT 489 (H) 09/08/2017   LYMPHOPCT 19 08/31/2017    MONOPCT 11 08/31/2017   EOSPCT 1 08/31/2017   BASOPCT 0 08/31/2017    CMP:  Lab Results  Component Value Date   NA 139 09/08/2017   K 3.8 09/08/2017   CL 98 (L) 09/08/2017   CO2 28 09/08/2017   BUN 61 (H) 09/08/2017   CREATININE 4.33 (H) 09/08/2017   CREATININE 3.74 (H) 08/23/2017   PROT 6.3 (L) 08/31/2017   ALBUMIN 2.0 (L) 08/31/2017   BILITOT 0.5 08/31/2017   ALKPHOS 94 08/31/2017   AST 16 08/31/2017   ALT 18 08/31/2017  .   Total Discharge time is about 33 minutes  Roxan Hockey M.D on 09/08/2017 at 4:30 PM  [facility]   Office  [phone]  Voice Recognition Viviann Spare dictation system was used to create this note, attempts have been made to correct errors. Please contact the author with questions and/or clarifications.

## 2017-09-08 NOTE — Progress Notes (Signed)
Inpatient Diabetes Program Recommendations  AACE/ADA: New Consensus Statement on Inpatient Glycemic Control (2015)  Target Ranges:  Prepandial:   less than 140 mg/dL      Peak postprandial:   less than 180 mg/dL (1-2 hours)      Critically ill patients:  140 - 180 mg/dL   Lab Results  Component Value Date   GLUCAP 236 (H) 09/08/2017   HGBA1C 11.0 (H) 08/18/2017    Review of Glycemic Control Results for Kathryn Beck, Kathryn Beck (MRN [id number]) as of 09/08/2017 15:18  Ref. Range 09/07/2017 16:45 09/07/2017 21:26 09/08/2017 07:36 09/08/2017 11:41  Glucose-Capillary Latest Ref Range: 65 - 99 mg/dL 120 (H) 144 (H) 137 (H) 236 (H)   Diabetes history: Type 2 DM Outpatient Diabetes medications: Levemir 120 units QD, Humalog 32-36 units TID Current orders for Inpatient glycemic control: Novolog 0-15 units TID, Novolog 15 units TID, Levemir 55 units BID, Novolog 0-5 units QHS  Inpatient Diabetes Program Recommendations:    Spoke with patient regarding outpatient diabetes management. Patient sees Kathryn Beck, endocrinologist for outpatient and has plans for follow up 09/30/17. Patient reports that she feels that her insulin "still isn't right." Patient also reports that Kathryn Beck just increased Levemir to 120 units QHS. Patient admits to missing doses a couple of times per week because of errands, etc.  Reviewed patient's current A1c of 11.0%. Explained what a A1c is and what it measures. Also reviewed goal A1c with patient, importance of good glucose control @ home, and blood sugar goals. Reviewed patho of DM, need for insulin, role of pancreas, vascular changes over time and long term co morbidities associated with poor control.  Patient reports checking BS 2-3 times per day with a range of 170-300's. Encouraged to write these values down in a journal and take them back to Kathryn Beck for additional adjustments if necessary. Encouraged to find creative ways to work towards health goals such as, not missing insulin  doses, checking BS, healthy eating and eliminating sugary beverages. Brainstormed on possible solutions.   Educated patient about outpatient education for additional reinforcement and patient is interested. Will plan to coordinate this following her stay at SNF. Additionally, patient does not remember meeting with a dietitian and has questions.   Thanks, Bronson Curb, MSN, RNC-OB Diabetes Coordinator [phone] (8a-5p)

## 2017-09-08 NOTE — Progress Notes (Signed)
Reviewed AVS with patient. Answered her questions. Pt's states she talked with her daughter, so I do not need to. Called report to [facility] in [place] and gave report to nurse, Myna Bright. Pt is stable and ready for discharge. Waiting on PTAR.

## 2017-09-08 NOTE — Progress Notes (Signed)
Brief Nutrition Education Follow-Up Note  Received another consult for diet education.   RD has educated pt multiple times during this admission (refer to notes dated 09/01/17, 09/06/17, and 09/07/17). Of note, pt is very forgetful and is relying on family members to assist with care. Pt daughter plans to assist pt with meal planning once she returns home. Pt family has multiple educational handouts and RD contact information.   Noted plan to discharge to SNF. RD at SNF can provide additional reinforcement of education.   RD will sign off, but re-consult if further nutritional needs arise.   Theola Cuellar A. [name], RD, LDN, CDE Pager: [phone] After hours Pager: [phone]

## 2017-09-09 ENCOUNTER — Non-Acute Institutional Stay (SKILLED_NURSING_FACILITY): Payer: 59 | Admitting: Internal Medicine

## 2017-09-09 ENCOUNTER — Encounter: Payer: Self-pay | Admitting: Internal Medicine

## 2017-09-09 DIAGNOSIS — E1121 Type 2 diabetes mellitus with diabetic nephropathy: Secondary | ICD-10-CM | POA: Diagnosis not present

## 2017-09-09 DIAGNOSIS — E1165 Type 2 diabetes mellitus with hyperglycemia: Secondary | ICD-10-CM | POA: Diagnosis not present

## 2017-09-09 DIAGNOSIS — N179 Acute kidney failure, unspecified: Secondary | ICD-10-CM | POA: Diagnosis not present

## 2017-09-09 DIAGNOSIS — N183 Chronic kidney disease, stage 3 (moderate): Secondary | ICD-10-CM | POA: Diagnosis not present

## 2017-09-09 DIAGNOSIS — I5033 Acute on chronic diastolic (congestive) heart failure: Secondary | ICD-10-CM | POA: Diagnosis not present

## 2017-09-09 DIAGNOSIS — R29898 Other symptoms and signs involving the musculoskeletal system: Secondary | ICD-10-CM | POA: Insufficient documentation

## 2017-09-09 DIAGNOSIS — E782 Mixed hyperlipidemia: Secondary | ICD-10-CM

## 2017-09-09 DIAGNOSIS — N185 Chronic kidney disease, stage 5: Secondary | ICD-10-CM | POA: Diagnosis not present

## 2017-09-09 DIAGNOSIS — IMO0002 Reserved for concepts with insufficient information to code with codable children: Secondary | ICD-10-CM

## 2017-09-09 DIAGNOSIS — D638 Anemia in other chronic diseases classified elsewhere: Secondary | ICD-10-CM

## 2017-09-09 NOTE — Progress Notes (Signed)
Provider:  Veleta Miners  Location:   [facility] Room Number: 143/P Place of Service:  SNF (31)  PCP: Fayrene Helper, MD Patient Care Team: Fayrene Helper, MD as PCP - General Domenic Polite Aloha Gell, MD as PCP - Cardiology (Cardiology) Gala Romney Cristopher Estimable, MD as Consulting Physician (Gastroenterology)  Extended Emergency Contact Information Primary Emergency Contact: Galan,Larry Address: [address]          [place], [place] [address] [place] Phone: [phone] Mobile Phone: [phone] Relation: Spouse Secondary Emergency Contact: Deon Pilling , [place] Phone: [phone] Mobile Phone: [phone] Relation: Daughter  Code Status: Full Code Goals of Care: Advanced Directive information Advanced Directives 09/09/2017  Does Patient Have a Medical Advance Directive? Yes  Type of Advance Directive (No Data)  Does patient want to make changes to medical advance directive? No - Patient declined  Would patient like information on creating a medical advance directive? No - Patient declined  Pre-existing out of facility DNR order (yellow form or pink MOST form) -      Chief Complaint  Patient presents with  . New Admit To SNF    New Admission Visit    HPI: Patient is a 62 y.o. female seen today for admission to SNF for therapy after staying in the hospital from 06/11-06/19 with Worsening Renal Function and LE edema.  Patient has h/o CKD Stage 5 s/p AV fistula, Uncontrolled Diabetes, HTN, HLD, Diastolic CHF, Anemia She was admitted to the Hospital after she continued to have worsening LE edema and SOB with inability to walk. Per Patient and her family she was Doing well until her discharge from the hospital in 05/19 for CAP. Since then she has been progressively getting weak and her Legs are progressively getting swollen. She was seen by her Nephrologist and increased  Diuretics but she  continued to get worse and was admitted to the hospital. In the Hospital she was diuresed aggressively and had AV fistula Placed on 06/13. She has done well with Diuresis. Denies any Cough or SOB or Chest pain . Her main complain is Bilateral Leg Pain. With inability to walk due to pain and Weakness. She lives with her husband and Supportive Kids. They are planning to try  Peritoneal Dialysis. She was working before and was independent.  Past Medical History:  Diagnosis Date  . Acid reflux   . Anemia   . Arthritis   . Axillary masses    Soft tissue - status post excision  . Back pain   . Chronic kidney disease   . Diastolic heart failure ([place])   . Essential hypertension   . Ischemic heart disease    Abnormal Myoview April 2018 - medical therapy  . Mixed hyperlipidemia   . Obesity   . Pancreatitis   . Sleep apnea    Noncompliant with CPAP  . Type 2 diabetes mellitus, uncontrolled ([place])    Past Surgical History:  Procedure Laterality Date  . ABDOMINAL HYSTERECTOMY    . AV FISTULA PLACEMENT Left 09/02/2017   Procedure: creation of left arm ARTERIOVENOUS (AV) FISTULA;  Surgeon: Serafina Mitchell, MD;  Location: [facility] OR;  Service: Vascular;  Laterality: Left;  . COLONOSCOPY  2008   Dr. Oneida Alar: normal   . COLONOSCOPY N/A 12/18/2016   Dr. Oneida Alar: multiple tubular adenomas, internal hemorrhoids. Surveillance in 3 years   . ESOPHAGEAL DILATION N/A 10/13/2015  Procedure: ESOPHAGEAL DILATION;  Surgeon: Rogene Houston, MD;  Location[facility];  Service: Endoscopy;  Laterality: N/A;  . ESOPHAGOGASTRODUODENOSCOPY N/A 10/13/2015   Dr. Laural Golden: chronic gastritis on path, no H.pylori. Empiric dilation   . ESOPHAGOGASTRODUODENOSCOPY N/A 12/18/2016   Dr. Oneida Alar: mild gastritis. BRAVO study revealed uncontrolled GERD. Dysphagia secondary to uncontrolled reflux  . FOOT SURGERY Bilateral   . LUNG BIOPSY    . MASS EXCISION Right 01/09/2013   Procedure: EXCISION OF NEOPLASM OF RIGHT  AXILLA  AND  EXCISION OF NEOPLASM OF LEFT AXILLA;  Surgeon: Jamesetta So, MD;  Location: [facility] ORS;  Service: General;  Laterality: Right;  procedure end @ 08:23  . MYRINGOTOMY WITH TUBE PLACEMENT Bilateral 04/28/2017   Procedure: BILATERAL MYRINGOTOMY WITH TUBE PLACEMENT;  Surgeon: Leta Baptist, MD;  Location: [facility];  Service: ENT;  Laterality: Bilateral;  . SAVORY DILATION N/A 12/18/2016   Procedure: SAVORY DILATION;  Surgeon: Danie Binder, MD;  Location: [facility];  Service: Endoscopy;  Laterality: N/A;    reports that she has never smoked. She has never used smokeless tobacco. She reports that she does not drink alcohol or use drugs. Social History   Socioeconomic History  . Marital status: Married    Spouse name: Not on file  . Number of children: Not on file  . Years of education: Not on file  . Highest education level: Not on file  Occupational History  . Not on file  Social Needs  . Financial resource strain: Not on file  . Food insecurity:    Worry: Not on file    Inability: Not on file  . Transportation needs:    Medical: Not on file    Non-medical: Not on file  Tobacco Use  . Smoking status: Never Smoker  . Smokeless tobacco: Never Used  Substance and Sexual Activity  . Alcohol use: No  . Drug use: No  . Sexual activity: Not Currently  Lifestyle  . Physical activity:    Days per week: Not on file    Minutes per session: Not on file  . Stress: Not on file  Relationships  . Social connections:    Talks on phone: Not on file    Gets together: Not on file    Attends religious service: Not on file    Active member of club or organization: Not on file    Attends meetings of clubs or organizations: Not on file    Relationship status: Not on file  . Intimate partner violence:    Fear of current or ex partner: Not on file    Emotionally abused: Not on file    Physically abused: Not on file    Forced sexual activity: Not on file  Other Topics Concern  . Not on file  Social  History Narrative  . Not on file    Functional Status Survey:    Family History  Problem Relation Age of Onset  . Hypertension Father   . Hypercholesterolemia Father   . Arthritis Father   . Hypertension Sister   . Hypercholesterolemia Sister   . Breast cancer Sister   . Hypertension Sister   . Colon cancer Neg Hx   . Colon polyps Neg Hx     Health Maintenance  Topic Date Due  . FOOT EXAM  08/29/2017  . OPHTHALMOLOGY EXAM  10/09/2017 (Originally 01/12/2017)  . URINE MICROALBUMIN  10/09/2017 (Originally 02/27/2017)  . PAP SMEAR  01/28/2023 (Originally 09/18/2016)  . INFLUENZA  VACCINE  10/21/2017  . HEMOGLOBIN A1C  02/18/2018  . MAMMOGRAM  12/17/2018  . TETANUS/TDAP  10/01/2024  . COLONOSCOPY  12/19/2026  . PNEUMOCOCCAL POLYSACCHARIDE VACCINE  Completed  . Hepatitis C Screening  Completed  . HIV Screening  Completed    Allergies  Allergen Reactions  . Ace Inhibitors Anaphylaxis and Swelling  . Penicillins Itching, Swelling and Other (See Comments)    SWELLING REACTION UNSPECIFIED   PATIENT HAS HAD A PCN REACTION WITH IMMEDIATE RASH, FACIAL/TONGUE/THROAT SWELLING, SOB, OR LIGHTHEADEDNESS WITH HYPOTENSION:  #  #  YES  #  #  Has patient had a PCN reaction causing severe rash involving mucus membranes or skin necrosis: No Has patient had a PCN reaction that required hospitalization No Has patient had a PCN reaction occurring within the last 10 years: No If all of the above answers are "NO", then may proceed with Cephalosporin use.   . Statins Other (See Comments)    elevated LFT's  . Albuterol Swelling    SWELLING REACTION UNSPECIFIED     Allergies as of 09/09/2017      Reactions   Ace Inhibitors Anaphylaxis, Swelling   Penicillins Itching, Swelling, Other (See Comments)   SWELLING REACTION UNSPECIFIED  PATIENT HAS HAD A PCN REACTION WITH IMMEDIATE RASH, FACIAL/TONGUE/THROAT SWELLING, SOB, OR LIGHTHEADEDNESS WITH HYPOTENSION:  #  #  YES  #  #  Has patient had a PCN  reaction causing severe rash involving mucus membranes or skin necrosis: No Has patient had a PCN reaction that required hospitalization No Has patient had a PCN reaction occurring within the last 10 years: No If all of the above answers are "NO", then may proceed with Cephalosporin use.   Statins Other (See Comments)   elevated LFT's   Albuterol Swelling   SWELLING REACTION UNSPECIFIED       Medication List        Accurate as of 09/09/17  9:43 AM. Always use your most recent med list.          acetaminophen 500 MG tablet Commonly known as:  TYLENOL Take 1,000 mg by mouth every 6 (six) hours as needed for moderate pain or headache.   aspirin 81 MG EC tablet Take 1 tablet (81 mg total) by mouth daily. With Food   atorvastatin 20 MG tablet Commonly known as:  LIPITOR Take 1 tablet (20 mg total) by mouth daily.   azelastine 0.1 % nasal spray Commonly known as:  ASTELIN Place 2 sprays into both nostrils 2 (two) times daily. Use in each nostril as directed   BENTYL 10 MG capsule Generic drug:  dicyclomine Take 10 mg by mouth 4 (four) times daily as needed for spasms.   calcitRIOL 0.25 MCG capsule Commonly known as:  ROCALTROL Take 0.25 mcg by mouth every Monday, Wednesday, and Friday.   calcium carbonate (dosed in mg elemental calcium) 1250 MG/5ML Susp Take 5 mLs (500 mg of elemental calcium total) by mouth every 6 (six) hours as needed for indigestion.   camphor-menthol lotion Commonly known as:  SARNA Apply 1 application topically every 8 (eight) hours as needed for itching.   cloNIDine 0.1 MG tablet Commonly known as:  CATAPRES TAKE 1 TABLET BY MOUTH  EVERY EVENING AT 9PM FOR  BLOOD PRESSURE   dexlansoprazole 60 MG capsule Commonly known as:  DEXILANT 1 PO EVERY MORNING WITH BREAKFAST.   dextromethorphan-guaiFENesin 30-600 MG 12hr tablet Commonly known as:  MUCINEX DM Take 1 tablet by mouth 2 (two) times daily.  ezetimibe 10 MG tablet Commonly known as:   ZETIA Take 1 tablet (10 mg total) by mouth daily.   gabapentin 100 MG capsule Commonly known as:  NEURONTIN Take 1 capsule (100 mg total) by mouth 2 (two) times daily.   hydrOXYzine 25 MG tablet Commonly known as:  ATARAX/VISTARIL Take 1 tablet (25 mg total) by mouth every 8 (eight) hours as needed for itching.   insulin aspart 100 UNIT/ML injection Commonly known as:  novoLOG Inject 0-15 Units into the skin 3 (three) times daily with meals.   insulin detemir 100 UNIT/ML injection Commonly known as:  LEVEMIR Inject 0.55 mLs (55 Units total) into the skin 2 (two) times daily.   isosorbide mononitrate 30 MG 24 hr tablet Commonly known as:  IMDUR Take 1 tablet (30 mg total) by mouth daily.   labetalol 200 MG tablet Commonly known as:  NORMODYNE Take 2 tablets (400 mg total) by mouth 2 (two) times daily.   methocarbamol 500 MG tablet Commonly known as:  ROBAXIN Take 1 tablet (500 mg total) by mouth every 8 (eight) hours as needed for muscle spasms.   metolazone 5 MG tablet Commonly known as:  ZAROXOLYN Take 1 tablet 5 mg every Monday, Wednesday, Friday and Sundays   ondansetron 4 MG disintegrating tablet Commonly known as:  ZOFRAN ODT Take 1 tablet (4 mg total) by mouth every 8 (eight) hours as needed for nausea or vomiting.   potassium chloride 20 MEQ packet Commonly known as:  KLOR-CON Take 40 mEq by mouth daily.   senna-docusate 8.6-50 MG tablet Commonly known as:  Senokot-S Take 2 tablets by mouth at bedtime as needed for mild constipation or moderate constipation.   torsemide 100 MG tablet Commonly known as:  DEMADEX Take 1 tablet (100 mg total) by mouth 2 (two) times daily.   Vitamin D (Ergocalciferol) 50000 units Caps capsule Commonly known as:  DRISDOL Take 1 capsule (50,000 Units total) by mouth every 7 (seven) days.       Review of Systems Review of Systems  Constitutional: Negative for activity change, appetite change, chills, diaphoresis, fatigue and  fever.  HENT: Negative for mouth sores, postnasal drip, rhinorrhea, sinus pain and sore throat.   Respiratory: Negative for apnea, cough, chest tightness, shortness of breath and wheezing.   Cardiovascular: Negative for chest pain, palpitations and leg swelling.  Gastrointestinal: Negative for abdominal distention, abdominal pain, constipation, diarrhea, nausea and vomiting.  Genitourinary: Negative for dysuria and frequency.  Musculoskeletal: Negative for arthralgias, joint swelling and myalgias.  Skin: Negative for rash.  Neurological: Negative for dizziness, syncope, weakness, light-headedness and numbness.  Psychiatric/Behavioral: Negative for behavioral problems, confusion and sleep disturbance.     Vitals:   09/09/17 0942  BP: (!) 150/70  Pulse: 77  Resp: 20  Temp: 98.6 F (37 C)  TempSrc: Oral   There is no height or weight on file to calculate BMI. Physical Exam  Constitutional: She is oriented to person, place, and time. She appears well-developed and well-nourished.  HENT:  Head: Normocephalic.  Mouth/Throat: Oropharynx is clear and moist.  Eyes: Pupils are equal, round, and reactive to light.  Neck: Neck supple.  Cardiovascular: Normal rate and regular rhythm.  Pulmonary/Chest: Effort normal and breath sounds normal. No stridor. No respiratory distress. She has no wheezes.  Abdominal: Soft. Bowel sounds are normal. She exhibits no distension. There is no tenderness. There is no guarding.  Musculoskeletal:  Trace edema  Neurological: She is alert and oriented to person, place, and  time.  Good strength in UE   Bilateral LE 3/5  Skin: Skin is warm and dry.  Psychiatric: She has a normal mood and affect. Her behavior is normal. Thought content normal.    Labs reviewed: Basic Metabolic Panel: Recent Labs    08/18/17 1210  08/31/17 2009  09/06/17 0450 09/07/17 0429 09/08/17 0454  NA 138   < >  --    < > 142 142 139  K 4.1   < >  --    < > 3.5 3.4* 3.8  CL 106    < >  --    < > 101 101 98*  CO2 22   < >  --    < > 29 29 28   GLUCOSE 509*   < >  --    < > 315* 180* 155*  BUN 39*   < >  --    < > 58* 58* 61*  CREATININE 3.73*   < >  --    < > 4.04* 4.10* 4.33*  CALCIUM 8.1*   < >  --    < > 9.1 9.2 9.0  MG 1.4*  --   --   --   --   --   --   PHOS 3.8  --  4.4  --   --   --   --    < > = values in this interval not displayed.   Liver Function Tests: Recent Labs    03/31/17 0845 08/15/17 1018 08/31/17 1343  AST 21 17 16   ALT 24 23 18   ALKPHOS 117 98 94  BILITOT 0.1* 0.6 0.5  PROT 6.0* 6.6 6.3*  ALBUMIN 2.5* 2.5* 2.0*   Recent Labs    02/15/17 1031 03/24/17 1027 03/25/17 0408 03/31/17 0845  LIPASE 163* 107* 79* 143*  AMYLASE 82  --   --   --    No results for input(s): AMMONIA in the last 8760 hours. CBC: Recent Labs    08/15/17 1018 08/18/17 1210  08/31/17 2330  09/05/17 0652 09/06/17 0450 09/08/17 0454  WBC 9.4 8.9   < > 13.8*   < > 8.9 10.3 10.6*  NEUTROABS 6.2 7.1  --  9.5*  --   --   --   --   HGB 9.5* 8.4*   < > 6.8*   < > 7.5* 7.5* 8.3*  HCT 31.7* 28.2*   < > 23.0*   < > 25.6* 25.6* 28.5*  MCV 73.5* 73.1*   < > 73.2*   < > 75.7* 75.7* 77.0*  PLT 492* 469*   < > 363   < > 426* 441* 489*   < > = values in this interval not displayed.   Cardiac Enzymes: Recent Labs    02/14/17 1122 03/24/17 1027 08/18/17 1210  TROPONINI <0.03 <0.03 <0.03   BNP: Invalid input(s): POCBNP Lab Results  Component Value Date   HGBA1C 11.0 (H) 08/18/2017   Lab Results  Component Value Date   TSH 4.16 12/24/2016   Lab Results  Component Value Date   VITAMINB12 528 05/04/2014   Lab Results  Component Value Date   FOLATE 12.1 05/04/2014   Lab Results  Component Value Date   IRON 7 (L) 09/01/2017   TIBC 158 (L) 09/01/2017   FERRITIN 288 09/01/2017    Imaging and Procedures obtained prior to SNF admission: No results found.  Assessment/Plan  Acute on chronic diastolic CHF with LE edema Due to Worsening Renal  Function Her Echo showed EF of 55% She is on High Dose of Diuretics. Continue to monitor weights   CKD (chronic kidney disease) stage 5,  S/P Fistula. Patient planning to start Dialysis soon. Continue Volume restrictions and Daily Weights Plan for follow up With Nephrology  Uncontrolled type 2 diabetes mellitus On High Dose of Insulin A!C was 11 in hospital. Continue Accu Checks Will restart on Bolus Insulin  Mixed hyperlipidemia On Lipitor  Anemia of chronic disease And Iron Def On IV Aranesp HGB is stable  Essential Hypertension Controlled on Labetalol,Clonidine and Duretics Bilateral leg weakness Patient is  significantly weak in both lower extremities We will start her on therapy. She is also complaining of pain.  Her family does not want her on any narcotics as she gets confused and drowsy. Will try some Ultram as needed Already on Neurontin Discharge planning Patient family planning to take her home when she is more independent in transfers    Family/ staff Communication:   Labs/tests ordered: Total time spent in this patient care encounter was 45_ minutes; greater than 50% of the visit spent counseling patient, reviewing records , Labs and coordinating care for problems addressed at this encounter.

## 2017-09-10 ENCOUNTER — Encounter (HOSPITAL_COMMUNITY): Payer: Self-pay

## 2017-09-10 ENCOUNTER — Non-Acute Institutional Stay (SKILLED_NURSING_FACILITY): Payer: 59 | Admitting: Internal Medicine

## 2017-09-10 ENCOUNTER — Encounter: Payer: Self-pay | Admitting: Internal Medicine

## 2017-09-10 DIAGNOSIS — N185 Chronic kidney disease, stage 5: Secondary | ICD-10-CM | POA: Diagnosis not present

## 2017-09-10 DIAGNOSIS — I5033 Acute on chronic diastolic (congestive) heart failure: Secondary | ICD-10-CM | POA: Diagnosis not present

## 2017-09-10 DIAGNOSIS — M79604 Pain in right leg: Secondary | ICD-10-CM

## 2017-09-10 DIAGNOSIS — M79605 Pain in left leg: Secondary | ICD-10-CM | POA: Diagnosis not present

## 2017-09-10 DIAGNOSIS — F329 Major depressive disorder, single episode, unspecified: Secondary | ICD-10-CM

## 2017-09-10 DIAGNOSIS — F32A Depression, unspecified: Secondary | ICD-10-CM

## 2017-09-10 NOTE — Progress Notes (Signed)
Location:   [facility] Room Number: 143/P Place of Service:  SNF [phone]) Provider:  Tera Helper, MD  Patient Care Team: Fayrene Helper, MD as PCP - General Domenic Polite Aloha Gell, MD as PCP - Cardiology (Cardiology) Gala Romney Cristopher Estimable, MD as Consulting Physician (Gastroenterology)  Extended Emergency Contact Information Primary Emergency Contact: Poet,Larry Address: [address]          [place], [place] [address] [address] [place] Phone: [phone] Mobile Phone: [phone] Relation: Spouse Secondary Emergency Contact: Deon Pilling , [place] Phone: [phone] Mobile Phone: [phone] Relation: Daughter  Code Status:  Full Code Goals of care: Advanced Directive information Advanced Directives 09/10/2017  Does Patient Have a Medical Advance Directive? Yes  Type of Advance Directive (No Data)  Does patient want to make changes to medical advance directive? No - Patient declined  Would patient like information on creating a medical advance directive? No - Patient declined  Pre-existing out of facility DNR order (yellow form or pink MOST form) -     Chief Complaint  Patient presents with  . Acute Visit    Patients c/o Depression  Follow-up CHF with history of renal insufficiency  HPI:  Pt is a 61 y.o. female seen today for an acute visit for depression- as well as follow-up with CHF with renal insufficiency She has just come to the facility earlier this week after hospitalization for worsening renal function with lower extremity edema Patient has h/o CKD Stage 5 s/p AV fistula, Uncontrolled Diabetes, HTN, HLD, Diastolic CHF, Anemia She was admitted to the Hospital after she continued to have worsening LE edema and SOB with inability to walk. Per Patient and her family she was Doing well until her discharge from the hospital in 05/19 for CAP. Since then she has been progressively  getting weak and her Legs are progressively getting swollen. She was seen by her Nephrologist and increased  Diuretics but she continued to get worse and was admitted to the hospital. In the Hospital she was diuresed aggressively and had AV fistula Placed on 06/13. She has done well with Diuresis  Yesterday she did complain of leg pain to Dr. Lyndel Safe Family does not want her to be on any narcotics because she gets confused and appears drowsy.  She has been prescribed tramadol as needed she is also on Neurontin it looks like she is doing a bit better today there is concern all the changes recently medically that patient somewhat in a down mood.  Speaking with her this afternoon she confirms this she is open to trying an antidepressant- she does not exhibit severe depression but has felt somewhat down dealing with her recent medical issues.  Her weight is somewhat down from her admission weight she is on numerous diuretics and appears to be tolerating this well --eventual plan is to go on dialysis       Past Medical History:  Diagnosis Date  . Acid reflux   . Anemia   . Arthritis   . Axillary masses    Soft tissue - status post excision  . Back pain   . Chronic kidney disease   . Diastolic heart failure ([place])   . Essential hypertension   . Ischemic heart disease    Abnormal Myoview April 2018 - medical therapy  . Mixed hyperlipidemia   . Obesity   . Pancreatitis   .  Sleep apnea    Noncompliant with CPAP  . Type 2 diabetes mellitus, uncontrolled ([place])    Past Surgical History:  Procedure Laterality Date  . ABDOMINAL HYSTERECTOMY    . AV FISTULA PLACEMENT Left 09/02/2017   Procedure: creation of left arm ARTERIOVENOUS (AV) FISTULA;  Surgeon: Serafina Mitchell, MD;  Location: [facility] OR;  Service: Vascular;  Laterality: Left;  . COLONOSCOPY  2008   Dr. Oneida Alar: normal   . COLONOSCOPY N/A 12/18/2016   Dr. Oneida Alar: multiple tubular adenomas, internal hemorrhoids. Surveillance in 3 years   .  ESOPHAGEAL DILATION N/A 10/13/2015   Procedure: ESOPHAGEAL DILATION;  Surgeon: Rogene Houston, MD;  [facility];  Service: Endoscopy;  Laterality: N/A;  . ESOPHAGOGASTRODUODENOSCOPY N/A 10/13/2015   Dr. Laural Golden: chronic gastritis on path, no H.pylori. Empiric dilation   . ESOPHAGOGASTRODUODENOSCOPY N/A 12/18/2016   Dr. Oneida Alar: mild gastritis. BRAVO study revealed uncontrolled GERD. Dysphagia secondary to uncontrolled reflux  . FOOT SURGERY Bilateral   . LUNG BIOPSY    . MASS EXCISION Right 01/09/2013   Procedure: EXCISION OF NEOPLASM OF RIGHT  AXILLA  AND EXCISION OF NEOPLASM OF LEFT AXILLA;  Surgeon: Jamesetta So, MD;  Location: [facility] ORS;  Service: General;  Laterality: Right;  procedure end @ 08:23  . MYRINGOTOMY WITH TUBE PLACEMENT Bilateral 04/28/2017   Procedure: BILATERAL MYRINGOTOMY WITH TUBE PLACEMENT;  Surgeon: Leta Baptist, MD;  Location: [facility];  Service: ENT;  Laterality: Bilateral;  . SAVORY DILATION N/A 12/18/2016   Procedure: SAVORY DILATION;  Surgeon: Danie Binder, MD;  Location: [facility];  Service: Endoscopy;  Laterality: N/A;    Allergies  Allergen Reactions  . Ace Inhibitors Anaphylaxis and Swelling  . Penicillins Itching, Swelling and Other (See Comments)    SWELLING REACTION UNSPECIFIED   PATIENT HAS HAD A PCN REACTION WITH IMMEDIATE RASH, FACIAL/TONGUE/THROAT SWELLING, SOB, OR LIGHTHEADEDNESS WITH HYPOTENSION:  #  #  YES  #  #  Has patient had a PCN reaction causing severe rash involving mucus membranes or skin necrosis: No Has patient had a PCN reaction that required hospitalization No Has patient had a PCN reaction occurring within the last 10 years: No If all of the above answers are "NO", then may proceed with Cephalosporin use.   . Statins Other (See Comments)    elevated LFT's  . Albuterol Swelling    SWELLING REACTION UNSPECIFIED     Outpatient Encounter Medications as of 09/10/2017  Medication Sig  . acetaminophen (TYLENOL) 500 MG tablet Take  1,000 mg by mouth every 6 (six) hours as needed for moderate pain or headache.  Marland Kitchen aspirin EC 81 MG EC tablet Take 1 tablet (81 mg total) by mouth daily. With Food  . atorvastatin (LIPITOR) 20 MG tablet Take 1 tablet (20 mg total) by mouth daily.  Marland Kitchen azelastine (ASTELIN) 0.1 % nasal spray Place 2 sprays into both nostrils 2 (two) times daily. Use in each nostril as directed  . calcitRIOL (ROCALTROL) 0.25 MCG capsule Take 0.25 mcg by mouth every Monday, Wednesday, and Friday.   . Calcium Carbonate Antacid (CALCIUM CARBONATE, DOSED IN MG ELEMENTAL CALCIUM,) 1250 MG/5ML SUSP Take 5 mLs (500 mg of elemental calcium total) by mouth every 6 (six) hours as needed for indigestion.  . camphor-menthol (SARNA) lotion Apply 1 application topically every 8 (eight) hours as needed for itching.  . cloNIDine (CATAPRES) 0.1 MG tablet TAKE 1 TABLET BY MOUTH  EVERY EVENING AT 9PM FOR  BLOOD PRESSURE  .  dexlansoprazole (DEXILANT) 60 MG capsule 1 PO EVERY MORNING WITH BREAKFAST.  Marland Kitchen dextromethorphan-guaiFENesin (MUCINEX DM) 30-600 MG 12hr tablet Take 1 tablet by mouth 2 (two) times daily.  Marland Kitchen dicyclomine (BENTYL) 10 MG capsule Take 10 mg by mouth 4 (four) times daily as needed for spasms.  Marland Kitchen ezetimibe (ZETIA) 10 MG tablet Take 1 tablet (10 mg total) by mouth daily.  Marland Kitchen gabapentin (NEURONTIN) 100 MG capsule Take 1 capsule (100 mg total) by mouth 2 (two) times daily.  . hydrOXYzine (ATARAX/VISTARIL) 25 MG tablet Take 1 tablet (25 mg total) by mouth every 8 (eight) hours as needed for itching.  . insulin aspart (NOVOLOG) 100 UNIT/ML injection Inject 8 Units into the skin 3 (three) times daily with meals.  . insulin detemir (LEVEMIR) 100 UNIT/ML injection Inject 0.55 mLs (55 Units total) into the skin 2 (two) times daily.  . isosorbide mononitrate (IMDUR) 30 MG 24 hr tablet Take 1 tablet (30 mg total) by mouth daily.  Marland Kitchen labetalol (NORMODYNE) 200 MG tablet Take 2 tablets (400 mg total) by mouth 2 (two) times daily.  .  methocarbamol (ROBAXIN) 500 MG tablet Take 1 tablet (500 mg total) by mouth every 8 (eight) hours as needed for muscle spasms.  . metolazone (ZAROXOLYN) 5 MG tablet Take 1 tablet 5 mg every Monday, Wednesday, Friday and Sundays  . ondansetron (ZOFRAN ODT) 4 MG disintegrating tablet Take 1 tablet (4 mg total) by mouth every 8 (eight) hours as needed for nausea or vomiting.  . potassium chloride (KLOR-CON) 20 MEQ packet Take 40 mEq by mouth daily.  Marland Kitchen senna-docusate (SENOKOT-S) 8.6-50 MG tablet Take 2 tablets by mouth at bedtime as needed for mild constipation or moderate constipation.  . torsemide (DEMADEX) 100 MG tablet Take 1 tablet (100 mg total) by mouth 2 (two) times daily.  . traMADol (ULTRAM) 50 MG tablet Take 25 mg by mouth every 6 (six) hours as needed.  . Vitamin D, Ergocalciferol, (DRISDOL) 50000 units CAPS capsule Take 1 capsule (50,000 Units total) by mouth every 7 (seven) days.  . [DISCONTINUED] FLUoxetine (PROZAC) 10 MG capsule Take 10 mg by mouth daily.    . [DISCONTINUED] glipiZIDE (GLUCOTROL) 10 MG tablet Take 10 mg by mouth 2 (two) times daily before a meal.    . [DISCONTINUED] insulin aspart (NOVOLOG) 100 UNIT/ML injection Inject 0-15 Units into the skin 3 (three) times daily with meals. (Patient taking differently: Inject 8 Units into the skin 3 (three) times daily with meals. )   No facility-administered encounter medications on file as of 09/10/2017.     Review of Systems   General she is not complaining of fever or chills has had some mild weight loss.  Skin does not complain of rashes or itching.  Ears eyes nose mouth and throat does not complain of visual changes difficulty swallowing or sore throat.  Respiratory does not complain of increased shortness of breath or cough.  Cardiac is not complaining of chest pain appears to have relatively baseline lower extremity edema.  GI does not complain of abdominal discomfort nausea vomiting diarrhea or constipation GU does  not complain of dysuria.  Musculoskeletal complaint of leg pain but apparently this is a bit better today.  Neurologic is not complaining of dizziness or headache.  Psych noted above does admit to some feelings of depression  Immunization History  Administered Date(s) Administered  . H1N1 01/16/2008  . Influenza Whole 12/28/2004, 02/24/2006, 12/07/2006, 02/01/2008, 01/28/2009, 11/25/2010  . Influenza,inj,Quad PF,6+ Mos 12/22/2012, 03/12/2015, 01/14/2016, 11/24/2016  .  Pneumococcal Polysaccharide-23 01/28/2009, 07/25/2015  . Td 11/12/2003  . Tdap 10/02/2014  . Zoster Recombinat (Shingrix) 12/24/2016   Pertinent  Health Maintenance Due  Topic Date Due  . FOOT EXAM  08/29/2017  . OPHTHALMOLOGY EXAM  10/09/2017 (Originally 01/12/2017)  . URINE MICROALBUMIN  10/09/2017 (Originally 02/27/2017)  . PAP SMEAR  01/28/2023 (Originally 09/18/2016)  . INFLUENZA VACCINE  10/21/2017  . HEMOGLOBIN A1C  02/18/2018  . MAMMOGRAM  12/17/2018  . COLONOSCOPY  12/19/2026   Fall Risk  07/08/2017 11/24/2016 06/17/2016 06/04/2016 03/02/2016  Falls in the past year? Yes Yes No No No  Number falls in past yr: 2 or more 2 or more - - -  Injury with Fall? - No - - -  Follow up - - - - -   Functional Status Survey:    Temperature is 97.9 pulse 79 respirations of 18 blood pressure 140/74  Physical Exam In general this is a pleasant middle-age female distress lying in bed.  Skin is warm and dry.  Eyes visual acuity appears to be intact sclera and conjunctive are clear.  Chest is clear to auscultation there is no labored breathing.  Heart is regular rate and rhythm with somewhat distant heart sounds she has mild lower extremity edema.  Abdomen is soft nontender with positive bowel sounds.  Muscle--skeletal has some lower extremity weakness compared to upper extremities is able to move all extremities  Neurologic is grossly intact her speech is clear no lateralizing findings  Psych she is alert and  oriented pleasant and appropriate--but when asked says she does feel somewhat down by her recent medical issues and would be open to trying an antidepressant  Labs reviewed: Recent Labs    08/18/17 1210  08/31/17 2009  09/06/17 0450 09/07/17 0429 09/08/17 0454  NA 138   < >  --    < > 142 142 139  K 4.1   < >  --    < > 3.5 3.4* 3.8  CL 106   < >  --    < > 101 101 98*  CO2 22   < >  --    < > 29 29 28   GLUCOSE 509*   < >  --    < > 315* 180* 155*  BUN 39*   < >  --    < > 58* 58* 61*  CREATININE 3.73*   < >  --    < > 4.04* 4.10* 4.33*  CALCIUM 8.1*   < >  --    < > 9.1 9.2 9.0  MG 1.4*  --   --   --   --   --   --   PHOS 3.8  --  4.4  --   --   --   --    < > = values in this interval not displayed.   Recent Labs    03/31/17 0845 08/15/17 1018 08/31/17 1343  AST 21 17 16   ALT 24 23 18   ALKPHOS 117 98 94  BILITOT 0.1* 0.6 0.5  PROT 6.0* 6.6 6.3*  ALBUMIN 2.5* 2.5* 2.0*   Recent Labs    08/15/17 1018 08/18/17 1210  08/31/17 2330  09/05/17 0652 09/06/17 0450 09/08/17 0454  WBC 9.4 8.9   < > 13.8*   < > 8.9 10.3 10.6*  NEUTROABS 6.2 7.1  --  9.5*  --   --   --   --   HGB 9.5* 8.4*   < >  6.8*   < > 7.5* 7.5* 8.3*  HCT 31.7* 28.2*   < > 23.0*   < > 25.6* 25.6* 28.5*  MCV 73.5* 73.1*   < > 73.2*   < > 75.7* 75.7* 77.0*  PLT 492* 469*   < > 363   < > 426* 441* 489*   < > = values in this interval not displayed.   Lab Results  Component Value Date   TSH 4.16 12/24/2016   Lab Results  Component Value Date   HGBA1C 11.0 (H) 08/18/2017   Lab Results  Component Value Date   CHOL 234 (H) 02/25/2017   HDL 38 (L) 02/25/2017   [place]  02/25/2017     Comment:     . LDL cholesterol not calculated. Triglyceride levels greater than 400 mg/dL invalidate calculated LDL results. . Reference range: <100 . Desirable range <100 mg/dL for primary prevention;   <70 mg/dL for patients with CHD or diabetic patients  with > or = 2 CHD risk factors. Marland Kitchen LDL-C is now  calculated using the Martin-Hopkins  calculation, which is a validated novel method providing  better accuracy than the Friedewald equation in the  estimation of LDL-C.  Cresenciano Genre et al. Annamaria Helling. 2248;250(03): 2061-2068  (http://education.[facility].com/faq/FAQ164)    LDLDIRECT 107 05/19/2016   TRIG 414 (H) 03/24/2017   CHOLHDL 6.2 (H) 02/25/2017    Significant Diagnostic Results in last 30 days:  Dg Chest 2 View  Result Date: 08/31/2017 CLINICAL DATA:  Recent diagnosis of pneumonia. Bilateral lower extremity swelling and pain. EXAM: CHEST - 2 VIEW COMPARISON:  Chest radiograph Aug 18, 2017 FINDINGS: Cardiac silhouette is mildly enlarged. Mediastinal silhouette is not suspicious. Mildly calcified aortic arch. Pulmonary vascular congestion and mild interstitial prominence. LEFT lung base pleural thickening without pleural effusion or focal consolidation. No pneumothorax. Soft tissue planes and included osseous structures are unchanged. IMPRESSION: Mild cardiomegaly. Mild interstitial prominence, possible pulmonary edema. Aortic Atherosclerosis (ICD10-I70.0). Electronically Signed   By: Elon Alas M.D.   On: 08/31/2017 14:12   Dg Chest 2 View  Result Date: 08/18/2017 CLINICAL DATA:  Shortness of breath, cough, chest soreness EXAM: CHEST - 2 VIEW COMPARISON:  08/17/2017 FINDINGS: Heart is borderline in size. Lingular linear atelectasis or scarring. Right lung clear. No effusions or acute bony abnormality. IMPRESSION: Lingular atelectasis or scarring. Electronically Signed   By: Rolm Baptise M.D.   On: 08/18/2017 11:53   Dg Chest 2 View  Result Date: 08/15/2017 CLINICAL DATA:  Cough, wheezing, SOB today EXAM: CHEST - 2 VIEW COMPARISON:  03/24/2017 FINDINGS: Ill-defined airspace opacities in the left mid lung and both lung bases left greater than right, increased since prior study. Heart size upper limits normal. No effusion. Visualized bones unremarkable. IMPRESSION: 1. Patchy bibasilar  airspace opacities left greater than right, possibly early pneumonia. Consider follow-up to confirm appropriate resolution. Electronically Signed   By: Lucrezia Europe M.D.   On: 08/15/2017 10:42   Ct Chest Wo Contrast  Result Date: 08/18/2017 CLINICAL DATA:  Recent diagnosis of pneumonia with persistent symptoms. Shortness of breath. EXAM: CT CHEST WITHOUT CONTRAST TECHNIQUE: Multidetector CT imaging of the chest was performed following the standard protocol without IV contrast. COMPARISON:  Chest radiography same day. FINDINGS: Cardiovascular: Heart size upper limits of normal. No pericardial fluid. Minimal visible coronary artery calcification. Mild calcification in the region of the aortic valve and aortic arch. Mediastinum/Nodes: No mass or lymphadenopathy. Lungs/Pleura: The right lung is clear and normal. No pleural fluid on the  right. The left lung shows mild patchy density in the left upper lobe, lingula and left lower lobe consistent with mild or resolving pneumonia. There is a small amount of pleural fluid on the left with mild dependent atelectasis. No finding to suggest underlying mass lesion. Upper Abdomen: Bilateral adrenal enlargement. This could represent adrenal hyperplasia or bilateral adrenal masses. These are unchanged from the previous abdominal CT of January 2019. Pancreatic head not included in the region studied. Musculoskeletal: Ordinary thoracic degenerative changes. IMPRESSION: Patchy density and volume loss in the left upper lobe, lingula and left lower lobe with a small left effusion. Findings would be consistent with resolving pneumonia. No specific features. Aortic atherosclerosis.  Mild coronary artery calcification. Bilateral adrenal enlargement redemonstrated, not visibly changed since the abdominal CT of January. Electronically Signed   By: Nelson Chimes M.D.   On: 08/18/2017 14:40    Assessment/Plan  #1- acute on chronic diastolic CHF with lower extremity edema---on metolazone  5 mg Sundays Mondays Wednesdays and Fridays- as well as on torsemide 100 mg twice a day--her weight appears to be trending slightly down-at this point will monitor  2 chronic kidney disease stage V status post fistula placement again plan is to start dialysis at some point- at this point continue volume restrictions and monitor weights--update lab is pending on Monday, June 24  #3-leg pain she has been started on low-dose tramadol --25 mg every 6 hours as needed she does not appear to be drowsy or confused today -but this will have to be monitored--she is also on Neurontin twice a day.  4.-  Depressive symptoms- will start low-dose Zoloft 25 mg a day reevaluate in approximately 1 week- does not appear to be acutely depressed but has dealt with a number of medical issues recently which are significant  [id number]- of note greater than 25 minutes spent assessing patient-reviewing her chart and labs- coordinating and formulating a plan of care- of note greater than 50% of time spent coordinating a plan of care with input as noted above

## 2017-09-11 ENCOUNTER — Encounter: Payer: Self-pay | Admitting: Internal Medicine

## 2017-09-13 ENCOUNTER — Encounter (HOSPITAL_COMMUNITY)
Admission: RE | Admit: 2017-09-13 | Discharge: 2017-09-13 | Disposition: A | Discharge status: Home / Self Care | Payer: 59 | Source: Skilled Nursing Facility | Attending: *Deleted | Admitting: *Deleted

## 2017-09-13 ENCOUNTER — Encounter (HOSPITAL_COMMUNITY): Payer: Self-pay

## 2017-09-13 ENCOUNTER — Telehealth (HOSPITAL_COMMUNITY): Payer: Self-pay | Admitting: Family Medicine

## 2017-09-13 ENCOUNTER — Telehealth: Payer: Self-pay

## 2017-09-13 DIAGNOSIS — M1990 Unspecified osteoarthritis, unspecified site: Secondary | ICD-10-CM | POA: Diagnosis not present

## 2017-09-13 DIAGNOSIS — N189 Chronic kidney disease, unspecified: Secondary | ICD-10-CM | POA: Diagnosis present

## 2017-09-13 DIAGNOSIS — I5032 Chronic diastolic (congestive) heart failure: Secondary | ICD-10-CM | POA: Diagnosis not present

## 2017-09-13 DIAGNOSIS — K861 Other chronic pancreatitis: Secondary | ICD-10-CM | POA: Diagnosis not present

## 2017-09-13 DIAGNOSIS — K219 Gastro-esophageal reflux disease without esophagitis: Secondary | ICD-10-CM | POA: Diagnosis not present

## 2017-09-13 LAB — CBC
HEMATOCRIT: 31.8 % — AB (ref 36.0–46.0)
HEMOGLOBIN: 9.4 g/dL — AB (ref 12.0–15.0)
MCH: 22.9 pg — ABNORMAL LOW (ref 26.0–34.0)
MCHC: 29.6 g/dL — ABNORMAL LOW (ref 30.0–36.0)
MCV: 77.6 fL — AB (ref 78.0–100.0)
Platelets: 610 10*3/uL — ABNORMAL HIGH (ref 150–400)
RBC: 4.1 MIL/uL (ref 3.87–5.11)
RDW: 17.2 % — ABNORMAL HIGH (ref 11.5–15.5)
WBC: 9.2 10*3/uL (ref 4.0–10.5)

## 2017-09-13 LAB — COMPREHENSIVE METABOLIC PANEL
ALT: 38 U/L (ref 14–54)
ANION GAP: 16 — AB (ref 5–15)
AST: 34 U/L (ref 15–41)
Albumin: 2.8 g/dL — ABNORMAL LOW (ref 3.5–5.0)
Alkaline Phosphatase: 160 U/L — ABNORMAL HIGH (ref 38–126)
BILIRUBIN TOTAL: 0.4 mg/dL (ref 0.3–1.2)
BUN: 103 mg/dL — ABNORMAL HIGH (ref 6–20)
CHLORIDE: 96 mmol/L — AB (ref 101–111)
CO2: 27 mmol/L (ref 22–32)
Calcium: 9.9 mg/dL (ref 8.9–10.3)
Creatinine, Ser: 5.89 mg/dL — ABNORMAL HIGH (ref 0.44–1.00)
GFR calc Af Amer: 8 mL/min — ABNORMAL LOW (ref >60)
GFR, EST NON AFRICAN AMERICAN: 7 mL/min — AB (ref >60)
Glucose, Bld: 289 mg/dL — ABNORMAL HIGH (ref 65–99)
POTASSIUM: 5 mmol/L (ref 3.5–5.1)
Sodium: 139 mmol/L (ref 135–145)
Total Protein: 8.4 g/dL — ABNORMAL HIGH (ref 6.5–8.1)

## 2017-09-13 LAB — PHOSPHORUS: Phosphorus: 7.9 mg/dL — ABNORMAL HIGH (ref 2.5–4.6)

## 2017-09-13 NOTE — Telephone Encounter (Signed)
Just needs to be advised of standard procedure for FMLA forms, that are done

## 2017-09-13 NOTE — Therapy (Signed)
[facility] [place], [place], [address] Phone: [phone]   Fax:  [phone]  Patient Details  Name: Kathryn Beck MRN: [id number] Date of Birth: [birth year] Referring Provider:  No ref. provider found  Encounter Date: 09/13/2017    Pt currently in [facility] receiving therapy and pt's dtr said to go ahead and discharge her from therapy.   PHYSICAL THERAPY DISCHARGE SUMMARY  Visits from Start of Care: 2  Current functional level related to goals / functional outcomes: See last treatment note   Remaining deficits: See last treatment note   Education / Equipment: See last treatment note  Plan: Patient agrees to discharge.  Patient goals were not met. Patient is being discharged due to the patient's request.  ?????     Geraldine Solar PT, [place] [address] [place], [place], [address] Phone: [phone]   Fax:  [phone]

## 2017-09-13 NOTE — Telephone Encounter (Signed)
Patients daughter [name] came in and stated that her mother was going to be placed on home dialysis and her and her brother were required to attend a week long class to receive training on how to care for their mother and assist her with this. [name] is able to take time off but her brother, Hilda Blades needs a short term FMLA filled out so he can attend the class for the dialysis. She will provide the dates of the classes but just wanted you to me aware now so that everything would go smoothly when the time came. Please advise

## 2017-09-13 NOTE — Telephone Encounter (Signed)
09/13/17  I was asked to call and see if patient wanted to continue with therapy or be discharged.... I spoke to her daughter and she said that she was currently in the [facility] receiving therapy and to go ahead and discharge her.

## 2017-09-13 NOTE — Telephone Encounter (Signed)
Message left on daughters voice mail. 

## 2017-09-16 ENCOUNTER — Emergency Department (HOSPITAL_COMMUNITY)
Admission: EM | Admit: 2017-09-16 | Discharge: 2017-09-16 | Disposition: A | Discharge status: Home / Self Care | Payer: 59 | Attending: Emergency Medicine | Admitting: Emergency Medicine

## 2017-09-16 ENCOUNTER — Emergency Department (HOSPITAL_COMMUNITY): Payer: 59

## 2017-09-16 ENCOUNTER — Encounter (HOSPITAL_COMMUNITY): Payer: Self-pay | Admitting: Emergency Medicine

## 2017-09-16 ENCOUNTER — Other Ambulatory Visit: Payer: Self-pay

## 2017-09-16 DIAGNOSIS — N185 Chronic kidney disease, stage 5: Secondary | ICD-10-CM | POA: Diagnosis not present

## 2017-09-16 DIAGNOSIS — E1122 Type 2 diabetes mellitus with diabetic chronic kidney disease: Secondary | ICD-10-CM | POA: Diagnosis not present

## 2017-09-16 DIAGNOSIS — Z79899 Other long term (current) drug therapy: Secondary | ICD-10-CM | POA: Insufficient documentation

## 2017-09-16 DIAGNOSIS — I951 Orthostatic hypotension: Secondary | ICD-10-CM

## 2017-09-16 DIAGNOSIS — I120 Hypertensive chronic kidney disease with stage 5 chronic kidney disease or end stage renal disease: Secondary | ICD-10-CM | POA: Diagnosis not present

## 2017-09-16 DIAGNOSIS — E860 Dehydration: Secondary | ICD-10-CM | POA: Insufficient documentation

## 2017-09-16 DIAGNOSIS — R55 Syncope and collapse: Secondary | ICD-10-CM | POA: Diagnosis present

## 2017-09-16 LAB — CBC WITH DIFFERENTIAL/PLATELET
Basophils Absolute: 0 10*3/uL (ref 0.0–0.1)
Basophils Relative: 0 %
EOS PCT: 2 %
Eosinophils Absolute: 0.3 10*3/uL (ref 0.0–0.7)
HCT: 30.6 % — ABNORMAL LOW (ref 36.0–46.0)
Hemoglobin: 9.3 g/dL — ABNORMAL LOW (ref 12.0–15.0)
LYMPHS ABS: 2.9 10*3/uL (ref 0.7–4.0)
LYMPHS PCT: 28 %
MCH: 23.1 pg — AB (ref 26.0–34.0)
MCHC: 30.4 g/dL (ref 30.0–36.0)
MCV: 75.9 fL — AB (ref 78.0–100.0)
MONOS PCT: 8 %
Monocytes Absolute: 0.9 10*3/uL (ref 0.1–1.0)
Neutro Abs: 6.5 10*3/uL (ref 1.7–7.7)
Neutrophils Relative %: 62 %
PLATELETS: 555 10*3/uL — AB (ref 150–400)
RBC: 4.03 MIL/uL (ref 3.87–5.11)
RDW: 16.8 % — ABNORMAL HIGH (ref 11.5–15.5)
WBC: 10.6 10*3/uL — AB (ref 4.0–10.5)

## 2017-09-16 LAB — BASIC METABOLIC PANEL
ANION GAP: 16 — AB (ref 5–15)
BUN: 57 mg/dL — ABNORMAL HIGH (ref 8–23)
CHLORIDE: 99 mmol/L (ref 98–111)
CO2: 26 mmol/L (ref 22–32)
Calcium: 10 mg/dL (ref 8.9–10.3)
Creatinine, Ser: 7.03 mg/dL — ABNORMAL HIGH (ref 0.44–1.00)
GFR calc non Af Amer: 6 mL/min — ABNORMAL LOW (ref >60)
GFR, EST AFRICAN AMERICAN: 7 mL/min — AB (ref >60)
Glucose, Bld: 177 mg/dL — ABNORMAL HIGH (ref 70–99)
POTASSIUM: 4.6 mmol/L (ref 3.5–5.1)
Sodium: 141 mmol/L (ref 135–145)

## 2017-09-16 LAB — URINALYSIS, ROUTINE W REFLEX MICROSCOPIC
BILIRUBIN URINE: NEGATIVE
GLUCOSE, UA: 50 mg/dL — AB
Ketones, ur: NEGATIVE mg/dL
LEUKOCYTES UA: NEGATIVE
NITRITE: NEGATIVE
PH: 5 (ref 5.0–8.0)
Protein, ur: >=300 mg/dL — AB
Specific Gravity, Urine: 1.014 (ref 1.005–1.030)

## 2017-09-16 LAB — TROPONIN I: Troponin I: <0.03 ng/mL (ref <0.03)

## 2017-09-16 LAB — CBG MONITORING, ED: Glucose-Capillary: 177 mg/dL — ABNORMAL HIGH (ref 70–99)

## 2017-09-16 MED ORDER — SODIUM CHLORIDE 0.9 % IV SOLN
INTRAVENOUS | Status: DC
  Administered 2017-09-16: 15:00:00 via INTRAVENOUS

## 2017-09-16 MED ORDER — METOLAZONE 5 MG PO TABS: ORAL_TABLET | ORAL | 1 refills | Status: DC

## 2017-09-16 NOTE — ED Notes (Signed)
Eating meal. Kathryn Beck.

## 2017-09-16 NOTE — ED Provider Notes (Signed)
[facility] EMERGENCY DEPARTMENT Provider Note   CSN: [id number] Arrival date & time: 09/16/17  1437     History   Chief Complaint Chief Complaint  Patient presents with  . Loss of Consciousness    HPI Kathryn Beck is a 62 y.o. female.  Pt presents to the ED today with loc.  Pt admitted to [facility] from 6/11-19 for CHF, LE edema and difficulty ambulating.  While in the hospital, she had a left UE AVF placed due to her renal failure (although she has not yet started dialysis).  She had her diuretic changed from lasix to torsemide and metolazone and was finally able to diurese some fluid.  The pt was d/c from [facility] to the [facility] center as she had grown so deconditioned that she was unable to walk.  The pt had been doing well at the [facility] center and weight had been trending down.  The pt was sitting in the bath tub when she had a syncopal event.  This was witnessed by staff.  Pt was orthostatic at the [facility].  The pt denies any pain, she just feels tired.       Past Medical History:  Diagnosis Date  . Acid reflux   . Anemia   . Arthritis   . Axillary masses    Soft tissue - status post excision  . Back pain   . Chronic kidney disease   . Diastolic heart failure ([place])   . Essential hypertension   . Ischemic heart disease    Abnormal Myoview April 2018 - medical therapy  . Mixed hyperlipidemia   . Obesity   . Pancreatitis   . Sleep apnea    Noncompliant with CPAP  . Type 2 diabetes mellitus, uncontrolled ([place])     Patient Active Problem List   Diagnosis Date Noted  . Bilateral leg weakness 09/09/2017  . Adrenal adenoma 09/03/2017  . Acute exacerbation of CHF (congestive heart failure) ([place]) 08/31/2017  . Edema due to hypoalbuminemia 08/31/2017  . Shortness of breath   . Community acquired pneumonia 08/18/2017  . Uncontrolled type 2 diabetes mellitus with nephropathy ([place]) 08/18/2017  . Acute renal failure superimposed on stage 3 chronic kidney disease ([place])  08/18/2017  . Unsteady gait 07/11/2017  . Recurrent falls 07/11/2017  . Anemia of chronic disease 03/24/2017  . History of colonic polyps 02/10/2017  . Dysphagia 11/05/2016  . IBS (irritable bowel syndrome) 02/04/2016  . Hospital discharge follow-up 10/27/2015  . Personal history of noncompliance with medical treatment, presenting hazards to health 10/21/2015  . Postprandial epigastric pain   . Heat sensitivity 10/07/2014  . Cardiac murmur 01/17/2014  . Hyperuricemia 08/04/2013  . Seasonal allergies 03/10/2013  . Lipoma of back 12/22/2012  . GERD (gastroesophageal reflux disease) 08/22/2012  . Sleep apnea 06/02/2011  . CKD (chronic kidney disease) stage 5, GFR less than 15 ml/min (HCC) 01/13/2011  . BACK PAIN WITH RADICULOPATHY 04/16/2010  . FATIGUE 07/24/2009  . LIVER FUNCTION TESTS, ABNORMAL, HX OF 05/08/2009  . LUPUS ERYTHEMATOSUS, DISCOID 06/05/2008  . Uncontrolled diabetes mellitus with diabetic nephropathy, with long-term current use of insulin ([place]) 06/07/2007  . HLD (hyperlipidemia) 06/07/2007  . Obesity 06/07/2007  . Malignant hypertension 06/07/2007    Past Surgical History:  Procedure Laterality Date  . ABDOMINAL HYSTERECTOMY    . AV FISTULA PLACEMENT Left 09/02/2017   Procedure: creation of left arm ARTERIOVENOUS (AV) FISTULA;  Surgeon: Serafina Mitchell, MD;  Location: [place];  Service: Vascular;  Laterality: Left;  .  COLONOSCOPY  2008   Dr. Oneida Alar: normal   . COLONOSCOPY N/A 12/18/2016   Dr. Oneida Alar: multiple tubular adenomas, internal hemorrhoids. Surveillance in 3 years   . ESOPHAGEAL DILATION N/A 10/13/2015   Procedure: ESOPHAGEAL DILATION;  Surgeon: Rogene Houston, MD;  [facility];  Service: Endoscopy;  Laterality: N/A;  . ESOPHAGOGASTRODUODENOSCOPY N/A 10/13/2015   Dr. Laural Golden: chronic gastritis on path, no H.pylori. Empiric dilation   . ESOPHAGOGASTRODUODENOSCOPY N/A 12/18/2016   Dr. Oneida Alar: mild gastritis. BRAVO study revealed uncontrolled GERD.  Dysphagia secondary to uncontrolled reflux  . FOOT SURGERY Bilateral   . LUNG BIOPSY    . MASS EXCISION Right 01/09/2013   Procedure: EXCISION OF NEOPLASM OF RIGHT  AXILLA  AND EXCISION OF NEOPLASM OF LEFT AXILLA;  Surgeon: Jamesetta So, MD;  Location: [facility];  Service: General;  Laterality: Right;  procedure end @ 08:23  . MYRINGOTOMY WITH TUBE PLACEMENT Bilateral 04/28/2017   Procedure: BILATERAL MYRINGOTOMY WITH TUBE PLACEMENT;  Surgeon: Leta Baptist, MD;  Location: [place];  Service: ENT;  Laterality: Bilateral;  . SAVORY DILATION N/A 12/18/2016   Procedure: SAVORY DILATION;  Surgeon: Danie Binder, MD;  [facility];  Service: Endoscopy;  Laterality: N/A;     OB History   None      Home Medications    Prior to Admission medications   Medication Sig Start Date End Date Taking? Authorizing Provider  acetaminophen (TYLENOL) 500 MG tablet Take 1,000 mg by mouth every 6 (six) hours as needed for moderate pain or headache.    [provider]  aspirin EC 81 MG EC tablet Take 1 tablet (81 mg total) by mouth daily. With Food 09/09/17   Roxan Hockey, MD  atorvastatin (LIPITOR) 20 MG tablet Take 1 tablet (20 mg total) by mouth daily. 05/31/17   Fayrene Helper, MD  azelastine (ASTELIN) 0.1 % nasal spray Place 2 sprays into both nostrils 2 (two) times daily. Use in each nostril as directed 05/31/17   Fayrene Helper, MD  calcitRIOL (ROCALTROL) 0.25 MCG capsule Take 0.25 mcg by mouth every Monday, Wednesday, and Friday.  09/15/16   Fayrene Helper, MD  Calcium Carbonate Antacid (CALCIUM CARBONATE, DOSED IN MG ELEMENTAL CALCIUM,) 1250 MG/5ML SUSP Take 5 mLs (500 mg of elemental calcium total) by mouth every 6 (six) hours as needed for indigestion. 09/08/17   Roxan Hockey, MD  camphor-menthol [facility]) lotion Apply 1 application topically every 8 (eight) hours as needed for itching. 09/08/17   Roxan Hockey, MD  cloNIDine (CATAPRES) 0.1 MG tablet TAKE 1 TABLET BY MOUTH   EVERY EVENING AT 9PM FOR  BLOOD PRESSURE 05/31/17   Fayrene Helper, MD  dexlansoprazole (DEXILANT) 60 MG capsule 1 PO EVERY MORNING WITH BREAKFAST. 04/15/17   Fields, Marga Melnick, MD  dextromethorphan-guaiFENesin (MUCINEX DM) 30-600 MG 12hr tablet Take 1 tablet by mouth 2 (two) times daily. 08/20/17   Barton Dubois, MD  dicyclomine (BENTYL) 10 MG capsule Take 10 mg by mouth 4 (four) times daily as needed for spasms.    [provider]  ezetimibe (ZETIA) 10 MG tablet Take 1 tablet (10 mg total) by mouth daily. 05/31/17   Fayrene Helper, MD  gabapentin (NEURONTIN) 100 MG capsule Take 1 capsule (100 mg total) by mouth 2 (two) times daily. 09/08/17   Roxan Hockey, MD  hydrOXYzine (ATARAX/VISTARIL) 25 MG tablet Take 1 tablet (25 mg total) by mouth every 8 (eight) hours as needed for itching. 09/08/17  Roxan Hockey, MD  insulin aspart (NOVOLOG) 100 UNIT/ML injection Inject 8 Units into the skin 3 (three) times daily with meals.    [provider]  insulin detemir (LEVEMIR) 100 UNIT/ML injection Inject 0.55 mLs (55 Units total) into the skin 2 (two) times daily. 09/08/17   Roxan Hockey, MD  isosorbide mononitrate (IMDUR) 30 MG 24 hr tablet Take 1 tablet (30 mg total) by mouth daily. 05/31/17   Fayrene Helper, MD  labetalol (NORMODYNE) 200 MG tablet Take 2 tablets (400 mg total) by mouth 2 (two) times daily. 05/31/17   Fayrene Helper, MD  methocarbamol (ROBAXIN) 500 MG tablet Take 1 tablet (500 mg total) by mouth every 8 (eight) hours as needed for muscle spasms. 08/20/17   Barton Dubois, MD  metolazone (ZAROXOLYN) 5 MG tablet Take 1 tablet 5 mg every Monday, Wednesday, Friday 09/16/17   Isla Pence, MD  ondansetron (ZOFRAN ODT) 4 MG disintegrating tablet Take 1 tablet (4 mg total) by mouth every 8 (eight) hours as needed for nausea or vomiting. 09/08/17   Roxan Hockey, MD  potassium chloride (KLOR-CON) 20 MEQ packet Take 40 mEq by mouth daily. 09/09/17   Roxan Hockey, MD  senna-docusate (SENOKOT-S) 8.6-50 MG tablet Take 2 tablets by mouth at bedtime as needed for mild constipation or moderate constipation. 09/08/17 09/08/18  Roxan Hockey, MD  torsemide (DEMADEX) 100 MG tablet Take 1 tablet (100 mg total) by mouth 2 (two) times daily. 09/08/17   Roxan Hockey, MD  traMADol (ULTRAM) 50 MG tablet Take 25 mg by mouth every 6 (six) hours as needed.    [provider]  Vitamin D, Ergocalciferol, (DRISDOL) 50000 units CAPS capsule Take 1 capsule (50,000 Units total) by mouth every 7 (seven) days. 07/08/17   Fayrene Helper, MD  FLUoxetine (PROZAC) 10 MG capsule Take 10 mg by mouth daily.    05/28/11  [provider]  glipiZIDE (GLUCOTROL) 10 MG tablet Take 10 mg by mouth 2 (two) times daily before a meal.    05/28/11  [provider]    Family History Family History  Problem Relation Age of Onset  . Hypertension Father   . Hypercholesterolemia Father   . Arthritis Father   . Hypertension Sister   . Hypercholesterolemia Sister   . Breast cancer Sister   . Hypertension Sister   . Colon cancer Neg Hx   . Colon polyps Neg Hx     Social History Social History   Tobacco Use  . Smoking status: Never Smoker  . Smokeless tobacco: Never Used  Substance Use Topics  . Alcohol use: No  . Drug use: No     Allergies   Ace inhibitors; Penicillins; Statins; and Albuterol   Review of Systems Review of Systems  Neurological: Positive for syncope.  All other systems reviewed and are negative.    Physical Exam Updated Vital Signs BP (!) 148/77   Pulse 70   Temp 98 F (36.7 C) (Oral)   Resp 10   SpO2 98%   Physical Exam  Constitutional: She is oriented to person, place, and time. She appears well-developed and well-nourished.  HENT:  Head: Normocephalic and atraumatic.  Right Ear: External ear normal.  Left Ear: External ear normal.  Nose: Nose normal.  Mouth/Throat: Oropharynx is clear and moist.  Eyes:  Pupils are equal, round, and reactive to light. Conjunctivae and EOM are normal.  Neck: Normal range of motion. Neck supple.  Cardiovascular: Normal rate, regular rhythm, normal heart  sounds and intact distal pulses.  Pulmonary/Chest: Effort normal and breath sounds normal.  Abdominal: Soft. Bowel sounds are normal.  Neurological: She is alert and oriented to person, place, and time.  Skin: Skin is warm. Capillary refill takes less than 2 seconds.  Psychiatric: She has a normal mood and affect. Her behavior is normal. Judgment and thought content normal.  Nursing note and vitals reviewed.    ED Treatments / Results  Labs (all labs ordered are listed, but only abnormal results are displayed) Labs Reviewed  BASIC METABOLIC PANEL - Abnormal; Notable for the following components:      Result Value   Glucose, Bld 177 (*)    BUN 57 (*)    Creatinine, Ser 7.03 (*)    GFR calc non Af Amer 6 (*)    GFR calc Af Amer 7 (*)    Anion gap 16 (*)    All other components within normal limits  URINALYSIS, ROUTINE W REFLEX MICROSCOPIC - Abnormal; Notable for the following components:   APPearance HAZY (*)    Glucose, UA 50 (*)    Hgb urine dipstick SMALL (*)    Protein, ur >=300 (*)    Bacteria, UA RARE (*)    All other components within normal limits  CBC WITH DIFFERENTIAL/PLATELET - Abnormal; Notable for the following components:   WBC 10.6 (*)    Hemoglobin 9.3 (*)    HCT 30.6 (*)    MCV 75.9 (*)    MCH 23.1 (*)    RDW 16.8 (*)    Platelets 555 (*)    All other components within normal limits  CBG MONITORING, ED - Abnormal; Notable for the following components:   Glucose-Capillary 177 (*)    All other components within normal limits  TROPONIN I    EKG EKG Interpretation  Date/Time:  Thursday September 16 2017 14:42:42 EDT Ventricular Rate:  67 PR Interval:    QRS Duration: 90 QT Interval:  433 QTC Calculation: 458 R Axis:   59 Text Interpretation:  Sinus rhythm Baseline wander in  lead(s) II III aVF Confirmed by Isla Pence [phone]) on 09/16/2017 5:23:01 PM   Radiology Dg Chest 2 View  Result Date: 09/16/2017 CLINICAL DATA:  Syncopal episode while sitting today. EXAM: CHEST - 2 VIEW COMPARISON:  08/31/2017 FINDINGS: Lungs are hypoinflated and otherwise clear. Cardiomediastinal silhouette and remainder of the exam is unchanged. IMPRESSION: Hypoinflation without acute cardiopulmonary disease. Electronically Signed   By: Marin Olp M.D.   On: 09/16/2017 15:41    Procedures Procedures (including critical care time)  Medications Ordered in ED Medications  0.9 %  sodium chloride infusion ( Intravenous New Bag/Given 09/16/17 1501)     Initial Impression / Assessment and Plan / ED Course  I have reviewed the triage vital signs and the nursing notes.  Pertinent labs & imaging results that were available during my care of the patient were reviewed by me and considered in my medical decision making (see chart for details).    Kidney function is worse.  Other labs are stable.  Pt has been diuresed too much, so she was gently hydrated while here.  The pt is told to decrease metolazone from sun, mwf to just mwf.  Return if worse.  Final Clinical Impressions(s) / ED Diagnoses   Final diagnoses:  Orthostatic hypotension  CKD (chronic kidney disease) stage 5, GFR less than 15 ml/min (HCC)  Dehydration    ED Discharge Orders  Ordered    metolazone (ZAROXOLYN) 5 MG tablet     09/16/17 1804       Isla Pence, MD 09/16/17 1806

## 2017-09-16 NOTE — ED Triage Notes (Signed)
Pt resident at [facility]. Had syncopal episode in w/c and did not fall. Was witnessed by staff. A/o. Pt c/o being tired before loc and now. C/o chronic leg pain. Nad. Pt had positive orthostatics at [facility].

## 2017-09-16 NOTE — ED Notes (Addendum)
Pt dc with [facility] staff via w/c. nad

## 2017-09-16 NOTE — Discharge Instructions (Signed)
Decrease metolazone to MWF.

## 2017-09-17 ENCOUNTER — Encounter: Payer: Self-pay | Admitting: Internal Medicine

## 2017-09-17 ENCOUNTER — Non-Acute Institutional Stay (SKILLED_NURSING_FACILITY): Payer: 59 | Admitting: Internal Medicine

## 2017-09-17 ENCOUNTER — Encounter (HOSPITAL_COMMUNITY)
Admission: RE | Admit: 2017-09-17 | Discharge: 2017-09-17 | Disposition: A | Discharge status: Home / Self Care | Payer: 59 | Source: Skilled Nursing Facility | Attending: *Deleted | Admitting: *Deleted

## 2017-09-17 DIAGNOSIS — M79605 Pain in left leg: Secondary | ICD-10-CM

## 2017-09-17 DIAGNOSIS — I5033 Acute on chronic diastolic (congestive) heart failure: Secondary | ICD-10-CM | POA: Diagnosis not present

## 2017-09-17 DIAGNOSIS — M79604 Pain in right leg: Secondary | ICD-10-CM

## 2017-09-17 DIAGNOSIS — N185 Chronic kidney disease, stage 5: Secondary | ICD-10-CM | POA: Diagnosis not present

## 2017-09-17 DIAGNOSIS — R55 Syncope and collapse: Secondary | ICD-10-CM

## 2017-09-17 LAB — RENAL FUNCTION PANEL
ALBUMIN: 2.9 g/dL — AB (ref 3.5–5.0)
Anion gap: 15 (ref 5–15)
BUN: 118 mg/dL — ABNORMAL HIGH (ref 8–23)
CO2: 24 mmol/L (ref 22–32)
Calcium: 9.7 mg/dL (ref 8.9–10.3)
Chloride: 102 mmol/L (ref 98–111)
Creatinine, Ser: 7.07 mg/dL — ABNORMAL HIGH (ref 0.44–1.00)
GFR calc Af Amer: 6 mL/min — ABNORMAL LOW (ref >60)
GFR, EST NON AFRICAN AMERICAN: 6 mL/min — AB (ref >60)
GLUCOSE: 275 mg/dL — AB (ref 70–99)
PHOSPHORUS: 8.1 mg/dL — AB (ref 2.5–4.6)
POTASSIUM: 4.6 mmol/L (ref 3.5–5.1)
Sodium: 141 mmol/L (ref 135–145)

## 2017-09-17 NOTE — Progress Notes (Signed)
Location:   [facility] Room Number: 143/P Place of Service:  SNF [phone]) Provider:  Tera Helper, MD  Patient Care Team: Fayrene Helper, MD as PCP - General Domenic Polite Aloha Gell, MD as PCP - Cardiology (Cardiology) Gala Romney Cristopher Estimable, MD as Consulting Physician (Gastroenterology)  Extended Emergency Contact Information Primary Emergency Contact: Slaymaker,Larry Address: [address]          [place], [place] [address] [place] Phone: [phone] Mobile Phone: [phone] Relation: Spouse Secondary Emergency Contact: Deon Pilling , [place] Phone: [phone] Mobile Phone: [phone] Relation: Daughter  Code Status:  Full Code Goals of care: Advanced Directive information Advanced Directives 09/17/2017  Does Patient Have a Medical Advance Directive? Yes  Type of Advance Directive (No Data)  Does patient want to make changes to medical advance directive? No - Patient declined  Would patient like information on creating a medical advance directive? No - Patient declined  Pre-existing out of facility DNR order (yellow form or pink MOST form) -     Chief complaint-acute visit follow-up ER visit yesterday for syncopal episode hypotension renal insufficiency  HPI:  Pt is a 62 y.o. female seen today for an acute visit for follow-up of an ER visit yesterday.  Patient is here for rehab and strengthening after hospitalization for worsening renal function with lower extremity edema.  Patient has h/o CKD Stage 5 s/p AV fistula, Uncontrolled Diabetes, HTN, HLD, Diastolic CHF, Anemia She was admitted to the Hospital after she continued to have worsening LE edema and SOB with inability to walk. Per Patient and her family she was Doing well until her discharge from the hospital in 05/19 for CAP. Since then she has been progressively getting weak and her Legs are progressively getting swollen.  She was seen by her Nephrologist and increased Diuretics but she continued to get worse and was admitted to the hospital. In the Hospital she was diuresed aggressively and had AV fistula Placed on 06/13. --She did well with diuresis- but did see nephrology earlier this week and her diuretics were decreased secondary to concerns of worsening renal function.  Her metolazone was held and Demadex was reduced to 100 mg once a day    Yesterday in facility patient was noted to have a presyncopal episode-blood pressure was taken was was low with systolic in the 62E.   was sent to the ER for evaluation  Work-up in ER showed some worsening renal function with a creatinine of 7.09 and BUN of 57--  She was hydrated- also orders were to decrease her metolazone however it appears her metolazone is already on hold  Chest x-ray did not show any acute process- other laboratory work appear to be baseline.  Today she says she is feeling significantly better she actually tolerated therapy well with no evidence of syncope- did take her blood pressure and got 140/80 manually  Updated labs today show relative stability with yesterday except BUN is now 118- creatinine is 7.07-sodium 141 and potassium 4.6    Past Medical History:  Diagnosis Date  . Acid reflux   . Anemia   . Arthritis   . Axillary masses    Soft tissue - status post excision  . Back pain   . Chronic kidney disease   . Diastolic heart failure ([place])   . Essential hypertension   . Ischemic heart disease  Abnormal Myoview April 2018 - medical therapy  . Mixed hyperlipidemia   . Obesity   . Pancreatitis   . Sleep apnea    Noncompliant with CPAP  . Type 2 diabetes mellitus, uncontrolled ([place])    Past Surgical History:  Procedure Laterality Date  . ABDOMINAL HYSTERECTOMY    . AV FISTULA PLACEMENT Left 09/02/2017   Procedure: creation of left arm ARTERIOVENOUS (AV) FISTULA;  Surgeon: Serafina Mitchell, MD;  Location: [facility] OR;  Service:  Vascular;  Laterality: Left;  . COLONOSCOPY  2008   Dr. Oneida Alar: normal   . COLONOSCOPY N/A 12/18/2016   Dr. Oneida Alar: multiple tubular adenomas, internal hemorrhoids. Surveillance in 3 years   . ESOPHAGEAL DILATION N/A 10/13/2015   Procedure: ESOPHAGEAL DILATION;  Surgeon: Rogene Houston, MD;  Location: [facility]O SUITE;  Service: Endoscopy;  Laterality: N/A;  . ESOPHAGOGASTRODUODENOSCOPY N/A 10/13/2015   Dr. Laural Golden: chronic gastritis on path, no H.pylori. Empiric dilation   . ESOPHAGOGASTRODUODENOSCOPY N/A 12/18/2016   Dr. Oneida Alar: mild gastritis. BRAVO study revealed uncontrolled GERD. Dysphagia secondary to uncontrolled reflux  . FOOT SURGERY Bilateral   . LUNG BIOPSY    . MASS EXCISION Right 01/09/2013   Procedure: EXCISION OF NEOPLASM OF RIGHT  AXILLA  AND EXCISION OF NEOPLASM OF LEFT AXILLA;  Surgeon: Jamesetta So, MD;  Location: [facility] ORS;  Service: General;  Laterality: Right;  procedure end @ 08:23  . MYRINGOTOMY WITH TUBE PLACEMENT Bilateral 04/28/2017   Procedure: BILATERAL MYRINGOTOMY WITH TUBE PLACEMENT;  Surgeon: Leta Baptist, MD;  Location: [facility];  Service: ENT;  Laterality: Bilateral;  . SAVORY DILATION N/A 12/18/2016   Procedure: SAVORY DILATION;  Surgeon: Danie Binder, MD;  Location: [facility];  Service: Endoscopy;  Laterality: N/A;    Allergies  Allergen Reactions  . Ace Inhibitors Anaphylaxis and Swelling  . Penicillins Itching, Swelling and Other (See Comments)    SWELLING REACTION UNSPECIFIED   PATIENT HAS HAD A PCN REACTION WITH IMMEDIATE RASH, FACIAL/TONGUE/THROAT SWELLING, SOB, OR LIGHTHEADEDNESS WITH HYPOTENSION:  #  #  YES  #  #  Has patient had a PCN reaction causing severe rash involving mucus membranes or skin necrosis: No Has patient had a PCN reaction that required hospitalization No Has patient had a PCN reaction occurring within the last 10 years: No If all of the above answers are "NO", then may proceed with Cephalosporin use.   . Statins Other (See Comments)     elevated LFT's  . Albuterol Swelling    SWELLING REACTION UNSPECIFIED     Outpatient Encounter Medications as of 09/17/2017  Medication Sig  . acetaminophen (TYLENOL) 500 MG tablet Take 1,000 mg by mouth every 6 (six) hours as needed for moderate pain or headache.  Marland Kitchen aspirin EC 81 MG EC tablet Take 1 tablet (81 mg total) by mouth daily. With Food  . atorvastatin (LIPITOR) 20 MG tablet Take 1 tablet (20 mg total) by mouth daily.  Marland Kitchen azelastine (ASTELIN) 0.1 % nasal spray Place 2 sprays into both nostrils 2 (two) times daily. Use in each nostril as directed  . calcitRIOL (ROCALTROL) 0.25 MCG capsule Take 0.25 mcg by mouth every Monday, Wednesday, and Friday.   . Calcium Carbonate Antacid (CALCIUM CARBONATE, DOSED IN MG ELEMENTAL CALCIUM,) 1250 MG/5ML SUSP Take 5 mLs (500 mg of elemental calcium total) by mouth every 6 (six) hours as needed for indigestion.  . camphor-menthol (SARNA) lotion Apply 1 application topically every 8 (eight) hours as needed for itching.  Marland Kitchen  cloNIDine (CATAPRES) 0.1 MG tablet TAKE 1 TABLET BY MOUTH  EVERY EVENING AT 9PM FOR  BLOOD PRESSURE  . dexlansoprazole (DEXILANT) 60 MG capsule 1 PO EVERY MORNING WITH BREAKFAST.  Marland Kitchen dextromethorphan-guaiFENesin (MUCINEX DM) 30-600 MG 12hr tablet Take 1 tablet by mouth 2 (two) times daily.  Marland Kitchen dicyclomine (BENTYL) 10 MG capsule Take 10 mg by mouth 4 (four) times daily as needed for spasms.  Marland Kitchen ezetimibe (ZETIA) 10 MG tablet Take 1 tablet (10 mg total) by mouth daily.  Marland Kitchen gabapentin (NEURONTIN) 100 MG capsule Take 1 capsule (100 mg total) by mouth 2 (two) times daily.  . hydrOXYzine (ATARAX/VISTARIL) 25 MG tablet Take 1 tablet (25 mg total) by mouth every 8 (eight) hours as needed for itching.  . insulin aspart (NOVOLOG) 100 UNIT/ML injection Inject 8 Units into the skin 3 (three) times daily with meals.  . insulin detemir (LEVEMIR) 100 UNIT/ML injection Inject 0.55 mLs (55 Units total) into the skin 2 (two) times daily.  . isosorbide  mononitrate (IMDUR) 30 MG 24 hr tablet Take 1 tablet (30 mg total) by mouth daily.  Marland Kitchen labetalol (NORMODYNE) 200 MG tablet Take 2 tablets (400 mg total) by mouth 2 (two) times daily.  . methocarbamol (ROBAXIN) 500 MG tablet Take 1 tablet (500 mg total) by mouth every 8 (eight) hours as needed for muscle spasms.  . ondansetron (ZOFRAN ODT) 4 MG disintegrating tablet Take 1 tablet (4 mg total) by mouth every 8 (eight) hours as needed for nausea or vomiting.  . senna-docusate (SENOKOT-S) 8.6-50 MG tablet Take 2 tablets by mouth at bedtime as needed for mild constipation or moderate constipation.  . sertraline (ZOLOFT) 25 MG tablet Take 25 mg by mouth daily.  Marland Kitchen torsemide (DEMADEX) 100 MG tablet Take 100 mg by mouth daily.  . traMADol (ULTRAM) 50 MG tablet Take 25 mg by mouth every 6 (six) hours as needed for moderate pain or severe pain.   . Vitamin D, Ergocalciferol, (DRISDOL) 50000 units CAPS capsule Take 1 capsule (50,000 Units total) by mouth every 7 (seven) days.  . [DISCONTINUED] FLUoxetine (PROZAC) 10 MG capsule Take 10 mg by mouth daily.    . [DISCONTINUED] glipiZIDE (GLUCOTROL) 10 MG tablet Take 10 mg by mouth 2 (two) times daily before a meal.    . [DISCONTINUED] metolazone (ZAROXOLYN) 5 MG tablet Take 1 tablet 5 mg every Monday, Wednesday, Friday (Patient taking differently: Take 5 mg by mouth See admin instructions. Take 1 tablet 5 mg every Sunday, Monday, Wednesday, Friday)  . [DISCONTINUED] potassium chloride (KLOR-CON) 20 MEQ packet Take 40 mEq by mouth daily.  . [DISCONTINUED] torsemide (DEMADEX) 100 MG tablet Take 1 tablet (100 mg total) by mouth 2 (two) times daily. (Patient taking differently: Take 100 mg by mouth daily. )   No facility-administered encounter medications on file as of 09/17/2017.     Review of Systems   General she is not complaining of any fever chills weight appears to be relatively stabilized.  Skin does not complain of rashes itching or diaphoresis.  Head  ears eyes nose mouth and throat does not complain of visual changes occultly swallowing or sore throat.  Respiratory is not complaining of being short of breath at this time or having a cough.  Cardiac is not complaining of chest pain edema appears to be relatively baseline with previous exams.  No presyncopal episode noted today.  GI is not complaining of abdominal discomfort nausea vomiting diarrhea constipation.  GU does not complain of dysuria again  she does have significant renal disease.  Musculoskeletal says her leg pain is somewhat improved.  Neurologic does not complain of dizziness headache or syncope today.  Psych does not complain of being depressed or anxious  Immunization History  Administered Date(s) Administered  . H1N1 01/16/2008  . Influenza Whole 12/28/2004, 02/24/2006, 12/07/2006, 02/01/2008, 01/28/2009, 11/25/2010  . Influenza,inj,Quad PF,6+ Mos 12/22/2012, 03/12/2015, 01/14/2016, 11/24/2016  . Pneumococcal Polysaccharide-23 01/28/2009, 07/25/2015  . Td 11/12/2003  . Tdap 10/02/2014  . Zoster Recombinat (Shingrix) 12/24/2016   Pertinent  Health Maintenance Due  Topic Date Due  . FOOT EXAM  08/29/2017  . OPHTHALMOLOGY EXAM  10/09/2017 (Originally 01/12/2017)  . URINE MICROALBUMIN  10/09/2017 (Originally 02/27/2017)  . PAP SMEAR  01/28/2023 (Originally 09/18/2016)  . INFLUENZA VACCINE  10/21/2017  . HEMOGLOBIN A1C  02/18/2018  . MAMMOGRAM  12/17/2018  . COLONOSCOPY  12/19/2026   Fall Risk  07/08/2017 11/24/2016 06/17/2016 06/04/2016 03/02/2016  Falls in the past year? Yes Yes No No No  Number falls in past yr: 2 or more 2 or more - - -  Injury with Fall? - No - - -  Follow up - - - - -   Functional Status Survey:    Temperature is 98.0 pulse is 84 respirations of 20 blood pressure is 140/80- weight is 182.2  Physical Exam   In general this is a very pleasant middle-age female in no distress lying comfortably in bed she says she feels better  today.  Her skin is warm and dry.  Eyes visual acuity appears to be intact sclera and conjunctive are clear.  Oropharynx is clear mucous membranes moist.  Chest is clear to auscultation there is no labored breathing.  Heart is regular rate and rhythm without murmur gallop or rub she has baseline lower extremity edema.  Abdomen is obese soft nontender with positive bowel sounds.  Musculoskeletal Limited exam since she is in bed but strength appears to be baseline all 4 extremities.  Neurologic is grossly intact her speech is clear and could not really appreciate lateralizing findings.  Psych she is alert and oriented pleasant and appropriate  Labs reviewed: Recent Labs    08/18/17 1210  08/31/17 2009  09/13/17 0710 09/16/17 1503 09/17/17 0733  NA 138   < >  --    < > 139 141 141  K 4.1   < >  --    < > 5.0 4.6 4.6  CL 106   < >  --    < > 96* 99 102  CO2 22   < >  --    < > 27 26 24   GLUCOSE 509*   < >  --    < > 289* 177* 275*  BUN 39*   < >  --    < > 103* 57* 118*  CREATININE 3.73*   < >  --    < > 5.89* 7.03* 7.07*  CALCIUM 8.1*   < >  --    < > 9.9 10.0 9.7  MG 1.4*  --   --   --   --   --   --   PHOS 3.8  --  4.4  --  7.9*  --  8.1*   < > = values in this interval not displayed.   Recent Labs    08/15/17 1018 08/31/17 1343 09/13/17 0710 09/17/17 0733  AST 17 16 34  --   ALT 23 18 38  --   ALKPHOS  98 94 160*  --   BILITOT 0.6 0.5 0.4  --   PROT 6.6 6.3* 8.4*  --   ALBUMIN 2.5* 2.0* 2.8* 2.9*   Recent Labs    08/18/17 1210  08/31/17 2330  09/08/17 0454 09/13/17 0710 09/16/17 1503  WBC 8.9   < > 13.8*   < > 10.6* 9.2 10.6*  NEUTROABS 7.1  --  9.5*  --   --   --  6.5  HGB 8.4*   < > 6.8*   < > 8.3* 9.4* 9.3*  HCT 28.2*   < > 23.0*   < > 28.5* 31.8* 30.6*  MCV 73.1*   < > 73.2*   < > 77.0* 77.6* 75.9*  PLT 469*   < > 363   < > 489* 610* 555*   < > = values in this interval not displayed.   Lab Results  Component Value Date   TSH 4.16 12/24/2016    Lab Results  Component Value Date   HGBA1C 11.0 (H) 08/18/2017   Lab Results  Component Value Date   CHOL 234 (H) 02/25/2017   HDL 38 (L) 02/25/2017   Natural Steps  02/25/2017     Comment:     . LDL cholesterol not calculated. Triglyceride levels greater than 400 mg/dL invalidate calculated LDL results. . Reference range: <100 . Desirable range <100 mg/dL for primary prevention;   <70 mg/dL for patients with CHD or diabetic patients  with > or = 2 CHD risk factors. Marland Kitchen LDL-C is now calculated using the Martin-Hopkins  calculation, which is a validated novel method providing  better accuracy than the Friedewald equation in the  estimation of LDL-C.  Cresenciano Genre et al. Annamaria Helling. 7106;269(48): 2061-2068  (http://education.[facility].com/faq/FAQ164)    LDLDIRECT 107 05/19/2016   TRIG 414 (H) 03/24/2017   CHOLHDL 6.2 (H) 02/25/2017    Significant Diagnostic Results in last 30 days:  Dg Chest 2 View  Result Date: 09/16/2017 CLINICAL DATA:  Syncopal episode while sitting today. EXAM: CHEST - 2 VIEW COMPARISON:  08/31/2017 FINDINGS: Lungs are hypoinflated and otherwise clear. Cardiomediastinal silhouette and remainder of the exam is unchanged. IMPRESSION: Hypoinflation without acute cardiopulmonary disease. Electronically Signed   By: Marin Olp M.D.   On: 09/16/2017 15:41   Dg Chest 2 View  Result Date: 08/31/2017 CLINICAL DATA:  Recent diagnosis of pneumonia. Bilateral lower extremity swelling and pain. EXAM: CHEST - 2 VIEW COMPARISON:  Chest radiograph Aug 18, 2017 FINDINGS: Cardiac silhouette is mildly enlarged. Mediastinal silhouette is not suspicious. Mildly calcified aortic arch. Pulmonary vascular congestion and mild interstitial prominence. LEFT lung base pleural thickening without pleural effusion or focal consolidation. No pneumothorax. Soft tissue planes and included osseous structures are unchanged. IMPRESSION: Mild cardiomegaly. Mild interstitial prominence, possible  pulmonary edema. Aortic Atherosclerosis (ICD10-I70.0). Electronically Signed   By: Elon Alas M.D.   On: 08/31/2017 14:12    Assessment/Plan  #1-history of presyncope question dehydration- nephrology has been contacted recommendation is to continue to hold the metolazone and continue Demadex at reduced dose  They have ordered labs for next Monday and said to monitor for any changes including bad taste in mouth shortness of breath-signs of worsening renal function.  Today she appears to be feeling significantly better than yesterday she is visiting with friends and family in the room but will monitor and will await updated labs.    Blood pressure appears to be stable she is on clonidine low-dose at night as well as labetalol-400 mg twice  daily- she appears to be tolerating this well but will have to be watched.  2.  History of diastolic CHF-again she is now on Demadex 100 mg a day diuretics have been reduced nonetheless weight appears to be relatively stable again this is a challenging Robaxin as needed and Tylenol as needed situation with her renal issues-but at this point appears relatively stabilized--she continues on a beta-blocker as well   #3 leg pain this appears to be controlled speaking with her nurse she did not receive her tramadol to day-she is on Neurontin twice a day as well as Robaxin as needed and Tylenol as needed- apparently she got her muscle relaxer and Tylenol and did well- at this point will hold the tramadol- unclear if this contributed at all to her issues yesterday but if she can tolerate pain without tramadol would be preferable.  [id number]

## 2017-09-18 ENCOUNTER — Encounter: Payer: Self-pay | Admitting: Internal Medicine

## 2017-09-19 ENCOUNTER — Emergency Department (HOSPITAL_COMMUNITY): Payer: 59

## 2017-09-19 ENCOUNTER — Non-Acute Institutional Stay (SKILLED_NURSING_FACILITY): Payer: 59 | Admitting: Internal Medicine

## 2017-09-19 ENCOUNTER — Encounter (HOSPITAL_COMMUNITY): Payer: Self-pay | Admitting: Emergency Medicine

## 2017-09-19 ENCOUNTER — Inpatient Hospital Stay (HOSPITAL_COMMUNITY)
Admission: EM | Admit: 2017-09-19 | Discharge: 2017-09-23 | DRG: 312 | Disposition: A | Discharge status: Skilled Nursing Facility | Payer: 59 | Source: Skilled Nursing Facility | Attending: Internal Medicine | Admitting: Internal Medicine

## 2017-09-19 ENCOUNTER — Encounter: Payer: Self-pay | Admitting: Internal Medicine

## 2017-09-19 ENCOUNTER — Encounter (HOSPITAL_COMMUNITY): Payer: Self-pay

## 2017-09-19 DIAGNOSIS — K219 Gastro-esophageal reflux disease without esophagitis: Secondary | ICD-10-CM | POA: Diagnosis present

## 2017-09-19 DIAGNOSIS — Z95828 Presence of other vascular implants and grafts: Secondary | ICD-10-CM

## 2017-09-19 DIAGNOSIS — J302 Other seasonal allergic rhinitis: Secondary | ICD-10-CM | POA: Diagnosis present

## 2017-09-19 DIAGNOSIS — Z794 Long term (current) use of insulin: Secondary | ICD-10-CM

## 2017-09-19 DIAGNOSIS — N189 Chronic kidney disease, unspecified: Secondary | ICD-10-CM

## 2017-09-19 DIAGNOSIS — N179 Acute kidney failure, unspecified: Secondary | ICD-10-CM | POA: Diagnosis present

## 2017-09-19 DIAGNOSIS — E870 Hyperosmolality and hypernatremia: Secondary | ICD-10-CM | POA: Diagnosis not present

## 2017-09-19 DIAGNOSIS — M5410 Radiculopathy, site unspecified: Secondary | ICD-10-CM | POA: Diagnosis present

## 2017-09-19 DIAGNOSIS — R569 Unspecified convulsions: Secondary | ICD-10-CM | POA: Diagnosis not present

## 2017-09-19 DIAGNOSIS — Z7982 Long term (current) use of aspirin: Secondary | ICD-10-CM

## 2017-09-19 DIAGNOSIS — R55 Syncope and collapse: Secondary | ICD-10-CM

## 2017-09-19 DIAGNOSIS — R4182 Altered mental status, unspecified: Secondary | ICD-10-CM

## 2017-09-19 DIAGNOSIS — G8929 Other chronic pain: Secondary | ICD-10-CM | POA: Diagnosis present

## 2017-09-19 DIAGNOSIS — I951 Orthostatic hypotension: Principal | ICD-10-CM

## 2017-09-19 DIAGNOSIS — E669 Obesity, unspecified: Secondary | ICD-10-CM | POA: Diagnosis present

## 2017-09-19 DIAGNOSIS — E875 Hyperkalemia: Secondary | ICD-10-CM | POA: Diagnosis present

## 2017-09-19 DIAGNOSIS — G4733 Obstructive sleep apnea (adult) (pediatric): Secondary | ICD-10-CM | POA: Diagnosis present

## 2017-09-19 DIAGNOSIS — Z79899 Other long term (current) drug therapy: Secondary | ICD-10-CM

## 2017-09-19 DIAGNOSIS — Z9181 History of falling: Secondary | ICD-10-CM

## 2017-09-19 DIAGNOSIS — E1140 Type 2 diabetes mellitus with diabetic neuropathy, unspecified: Secondary | ICD-10-CM | POA: Diagnosis present

## 2017-09-19 DIAGNOSIS — Z803 Family history of malignant neoplasm of breast: Secondary | ICD-10-CM

## 2017-09-19 DIAGNOSIS — I5032 Chronic diastolic (congestive) heart failure: Secondary | ICD-10-CM

## 2017-09-19 DIAGNOSIS — Z8349 Family history of other endocrine, nutritional and metabolic diseases: Secondary | ICD-10-CM

## 2017-09-19 DIAGNOSIS — E785 Hyperlipidemia, unspecified: Secondary | ICD-10-CM | POA: Diagnosis present

## 2017-09-19 DIAGNOSIS — N184 Chronic kidney disease, stage 4 (severe): Secondary | ICD-10-CM | POA: Diagnosis present

## 2017-09-19 DIAGNOSIS — Z8261 Family history of arthritis: Secondary | ICD-10-CM

## 2017-09-19 DIAGNOSIS — I10 Essential (primary) hypertension: Secondary | ICD-10-CM | POA: Diagnosis present

## 2017-09-19 DIAGNOSIS — E782 Mixed hyperlipidemia: Secondary | ICD-10-CM | POA: Diagnosis present

## 2017-09-19 DIAGNOSIS — Z8249 Family history of ischemic heart disease and other diseases of the circulatory system: Secondary | ICD-10-CM

## 2017-09-19 DIAGNOSIS — Z888 Allergy status to other drugs, medicaments and biological substances status: Secondary | ICD-10-CM

## 2017-09-19 DIAGNOSIS — N185 Chronic kidney disease, stage 5: Secondary | ICD-10-CM | POA: Diagnosis present

## 2017-09-19 DIAGNOSIS — N2581 Secondary hyperparathyroidism of renal origin: Secondary | ICD-10-CM | POA: Diagnosis present

## 2017-09-19 DIAGNOSIS — R5381 Other malaise: Secondary | ICD-10-CM | POA: Diagnosis present

## 2017-09-19 DIAGNOSIS — M62838 Other muscle spasm: Secondary | ICD-10-CM | POA: Diagnosis present

## 2017-09-19 DIAGNOSIS — I132 Hypertensive heart and chronic kidney disease with heart failure and with stage 5 chronic kidney disease, or end stage renal disease: Secondary | ICD-10-CM | POA: Diagnosis present

## 2017-09-19 DIAGNOSIS — D631 Anemia in chronic kidney disease: Secondary | ICD-10-CM | POA: Diagnosis present

## 2017-09-19 DIAGNOSIS — Z90710 Acquired absence of both cervix and uterus: Secondary | ICD-10-CM

## 2017-09-19 DIAGNOSIS — Z9119 Patient's noncompliance with other medical treatment and regimen: Secondary | ICD-10-CM

## 2017-09-19 DIAGNOSIS — Z6831 Body mass index (BMI) 31.0-31.9, adult: Secondary | ICD-10-CM | POA: Diagnosis not present

## 2017-09-19 DIAGNOSIS — Z86010 Personal history of colonic polyps: Secondary | ICD-10-CM

## 2017-09-19 DIAGNOSIS — Z880 Allergy status to penicillin: Secondary | ICD-10-CM

## 2017-09-19 DIAGNOSIS — L930 Discoid lupus erythematosus: Secondary | ICD-10-CM | POA: Diagnosis present

## 2017-09-19 DIAGNOSIS — E1122 Type 2 diabetes mellitus with diabetic chronic kidney disease: Secondary | ICD-10-CM

## 2017-09-19 DIAGNOSIS — N186 End stage renal disease: Secondary | ICD-10-CM

## 2017-09-19 LAB — CBG MONITORING, ED: GLUCOSE-CAPILLARY: 282 mg/dL — AB (ref 70–99)

## 2017-09-19 LAB — CBC
HEMATOCRIT: 30.9 % — AB (ref 36.0–46.0)
HEMOGLOBIN: 9.4 g/dL — AB (ref 12.0–15.0)
MCH: 22.8 pg — ABNORMAL LOW (ref 26.0–34.0)
MCHC: 30.4 g/dL (ref 30.0–36.0)
MCV: 75 fL — ABNORMAL LOW (ref 78.0–100.0)
Platelets: 499 10*3/uL — ABNORMAL HIGH (ref 150–400)
RBC: 4.12 MIL/uL (ref 3.87–5.11)
RDW: 17.1 % — ABNORMAL HIGH (ref 11.5–15.5)
WBC: 8.8 10*3/uL (ref 4.0–10.5)

## 2017-09-19 LAB — BASIC METABOLIC PANEL
ANION GAP: 16 — AB (ref 5–15)
BUN: 116 mg/dL — ABNORMAL HIGH (ref 8–23)
CALCIUM: 9.9 mg/dL (ref 8.9–10.3)
CO2: 22 mmol/L (ref 22–32)
Chloride: 101 mmol/L (ref 98–111)
Creatinine, Ser: 7.02 mg/dL — ABNORMAL HIGH (ref 0.44–1.00)
GFR, EST AFRICAN AMERICAN: 7 mL/min — AB (ref >60)
GFR, EST NON AFRICAN AMERICAN: 6 mL/min — AB (ref >60)
Glucose, Bld: 322 mg/dL — ABNORMAL HIGH (ref 70–99)
POTASSIUM: 4.2 mmol/L (ref 3.5–5.1)
Sodium: 139 mmol/L (ref 135–145)

## 2017-09-19 LAB — URINALYSIS, ROUTINE W REFLEX MICROSCOPIC
Bilirubin Urine: NEGATIVE
GLUCOSE, UA: 150 mg/dL — AB
KETONES UR: NEGATIVE mg/dL
LEUKOCYTES UA: NEGATIVE
NITRITE: NEGATIVE
PH: 5 (ref 5.0–8.0)
Protein, ur: >=300 mg/dL — AB
SPECIFIC GRAVITY, URINE: 1.015 (ref 1.005–1.030)

## 2017-09-19 LAB — TROPONIN I: Troponin I: <0.03 ng/mL (ref <0.03)

## 2017-09-19 LAB — GLUCOSE, CAPILLARY
Glucose-Capillary: 327 mg/dL — ABNORMAL HIGH (ref 70–99)
Glucose-Capillary: 284 mg/dL — ABNORMAL HIGH (ref 70–99)

## 2017-09-19 MED ORDER — ATORVASTATIN CALCIUM 20 MG PO TABS
20.0000 mg | ORAL_TABLET | Freq: Every day | ORAL | Status: DC
  Administered 2017-09-20 – 2017-09-22 (×3): 20 mg via ORAL
  Filled 2017-09-19 (×3): qty 1

## 2017-09-19 MED ORDER — AZELASTINE HCL 0.1 % NA SOLN
2.0000 | Freq: Two times a day (BID) | NASAL | Status: DC
  Administered 2017-09-19 – 2017-09-23 (×6): 2 via NASAL
  Filled 2017-09-19 (×2): qty 30

## 2017-09-19 MED ORDER — ASPIRIN EC 81 MG PO TBEC
81.0000 mg | DELAYED_RELEASE_TABLET | Freq: Every day | ORAL | Status: DC
  Administered 2017-09-20 – 2017-09-23 (×4): 81 mg via ORAL
  Filled 2017-09-19 (×4): qty 1

## 2017-09-19 MED ORDER — ACETAMINOPHEN 325 MG PO TABS: 650.0000 mg | ORAL_TABLET | Freq: Four times a day (QID) | ORAL | Status: DC | PRN

## 2017-09-19 MED ORDER — INSULIN ASPART 100 UNIT/ML ~~LOC~~ SOLN: 8.0000 [IU] | Freq: Three times a day (TID) | SUBCUTANEOUS | Status: DC

## 2017-09-19 MED ORDER — METHOCARBAMOL 500 MG PO TABS
500.0000 mg | ORAL_TABLET | Freq: Three times a day (TID) | ORAL | Status: DC | PRN
  Administered 2017-09-20: 500 mg via ORAL
  Filled 2017-09-19: qty 1

## 2017-09-19 MED ORDER — CALCIUM CARBONATE ANTACID 1250 MG/5ML PO SUSP: 500.0000 mg | Freq: Four times a day (QID) | ORAL | Status: DC | PRN

## 2017-09-19 MED ORDER — VITAMIN D (ERGOCALCIFEROL) 1.25 MG (50000 UNIT) PO CAPS
50000.0000 [IU] | ORAL_CAPSULE | ORAL | Status: DC
  Administered 2017-09-22: 50000 [IU] via ORAL
  Filled 2017-09-19: qty 1

## 2017-09-19 MED ORDER — SENNOSIDES-DOCUSATE SODIUM 8.6-50 MG PO TABS: 2.0000 | ORAL_TABLET | Freq: Every evening | ORAL | Status: DC | PRN

## 2017-09-19 MED ORDER — HYDROXYZINE HCL 25 MG PO TABS: 25.0000 mg | ORAL_TABLET | Freq: Three times a day (TID) | ORAL | Status: DC | PRN

## 2017-09-19 MED ORDER — INSULIN DETEMIR 100 UNIT/ML ~~LOC~~ SOLN
40.0000 [IU] | Freq: Two times a day (BID) | SUBCUTANEOUS | Status: DC
  Administered 2017-09-19 – 2017-09-20 (×2): 40 [IU] via SUBCUTANEOUS
  Filled 2017-09-19 (×4): qty 0.4

## 2017-09-19 MED ORDER — ENOXAPARIN SODIUM 30 MG/0.3ML ~~LOC~~ SOLN
30.0000 mg | SUBCUTANEOUS | Status: DC
  Administered 2017-09-20 – 2017-09-23 (×4): 30 mg via SUBCUTANEOUS
  Filled 2017-09-19 (×4): qty 0.3

## 2017-09-19 MED ORDER — EZETIMIBE 10 MG PO TABS
10.0000 mg | ORAL_TABLET | Freq: Every day | ORAL | Status: DC
  Administered 2017-09-20 – 2017-09-23 (×4): 10 mg via ORAL
  Filled 2017-09-19 (×4): qty 1

## 2017-09-19 MED ORDER — TRAMADOL HCL 50 MG PO TABS
25.0000 mg | ORAL_TABLET | Freq: Four times a day (QID) | ORAL | Status: DC | PRN
  Administered 2017-09-22: 25 mg via ORAL
  Filled 2017-09-19: qty 1

## 2017-09-19 MED ORDER — SODIUM CHLORIDE 0.9 % IV SOLN: INTRAVENOUS | Status: DC

## 2017-09-19 MED ORDER — CALCITRIOL 0.25 MCG PO CAPS
0.2500 ug | ORAL_CAPSULE | ORAL | Status: DC
  Administered 2017-09-20 – 2017-09-22 (×2): 0.25 ug via ORAL
  Filled 2017-09-19 (×2): qty 1

## 2017-09-19 MED ORDER — INSULIN ASPART 100 UNIT/ML ~~LOC~~ SOLN
0.0000 [IU] | Freq: Three times a day (TID) | SUBCUTANEOUS | Status: DC
  Administered 2017-09-19 – 2017-09-20 (×3): 7 [IU] via SUBCUTANEOUS
  Administered 2017-09-20 – 2017-09-21 (×4): 3 [IU] via SUBCUTANEOUS
  Administered 2017-09-22 (×2): 2 [IU] via SUBCUTANEOUS
  Administered 2017-09-22: 1 [IU] via SUBCUTANEOUS
  Administered 2017-09-23 (×2): 2 [IU] via SUBCUTANEOUS

## 2017-09-19 MED ORDER — GABAPENTIN 100 MG PO CAPS: 100.0000 mg | ORAL_CAPSULE | Freq: Two times a day (BID) | ORAL | Status: DC

## 2017-09-19 MED ORDER — DM-GUAIFENESIN ER 30-600 MG PO TB12: 1.0000 | ORAL_TABLET | Freq: Two times a day (BID) | ORAL | Status: DC

## 2017-09-19 MED ORDER — ATORVASTATIN CALCIUM 10 MG PO TABS: 20.0000 mg | ORAL_TABLET | Freq: Every day | ORAL | Status: DC

## 2017-09-19 MED ORDER — SODIUM CHLORIDE 0.9 % IV SOLN
INTRAVENOUS | Status: DC
  Administered 2017-09-19 – 2017-09-21 (×5): via INTRAVENOUS

## 2017-09-19 MED ORDER — ONDANSETRON 4 MG PO TBDP
4.0000 mg | ORAL_TABLET | Freq: Three times a day (TID) | ORAL | Status: DC | PRN
  Administered 2017-09-21 (×2): 4 mg via ORAL
  Filled 2017-09-19 (×2): qty 1

## 2017-09-19 MED ORDER — SERTRALINE HCL 50 MG PO TABS: 25.0000 mg | ORAL_TABLET | Freq: Every day | ORAL | Status: DC

## 2017-09-19 MED ORDER — GABAPENTIN 100 MG PO CAPS
100.0000 mg | ORAL_CAPSULE | Freq: Every day | ORAL | Status: DC
  Administered 2017-09-19 – 2017-09-22 (×4): 100 mg via ORAL
  Filled 2017-09-19 (×4): qty 1

## 2017-09-19 MED ORDER — DICYCLOMINE HCL 10 MG PO CAPS: 10.0000 mg | ORAL_CAPSULE | Freq: Four times a day (QID) | ORAL | Status: DC | PRN

## 2017-09-19 MED ORDER — SERTRALINE HCL 50 MG PO TABS
25.0000 mg | ORAL_TABLET | Freq: Every day | ORAL | Status: DC
  Administered 2017-09-20 – 2017-09-23 (×4): 25 mg via ORAL
  Filled 2017-09-19 (×4): qty 1

## 2017-09-19 MED ORDER — INSULIN DETEMIR 100 UNIT/ML ~~LOC~~ SOLN: 55.0000 [IU] | Freq: Two times a day (BID) | SUBCUTANEOUS | Status: DC

## 2017-09-19 NOTE — ED Triage Notes (Signed)
EMS reports pt had a syncopal episode while in the bathroom at the [facility].  States pt was having a bowel movement when this happened and pt was diaphoretic and hypotensive.  Pt states she just feels weak all over and this is the same that happened a few days ago.

## 2017-09-19 NOTE — ED Notes (Signed)
Patient returned from CT

## 2017-09-19 NOTE — Progress Notes (Signed)
This is an acute visit.  Level of care is skilled.  Facility is [facility] complaint-acute visit secondary to change in mental status-questionable seizure  History of present illness.  Patient is a pleasant 62 year old female- she is here for rehab and strengthening after hospitalization for worsening renal function and lower extremity edema.  She has a complicated history including stage V chronic kidney disease status post AV fistula- controlled diabetes- as well as hypertension hyperlipidemia diastolic CHF and anemia.  She was admitted to the hospital for worsening lower extremity edema and shortness of breath- he was discharged from the hospital recently after being treated for pneumonia  However she became progressively weak and had edema- nephrology increased her diuretics but she still got worse and was admitted to the hospital.  She was diuresed aggressively and had an AV fistula placed on June 13  She did well with diuresis but did see nephrology subsequently and diuretics were decreased because of concerns of worsening renal function  Earlier this week she was noted to have a presyncopal episode and was sent to the ER--  \Work-up there showed worsening renal function chest x-ray did not show any acute process other laboratory work appear to be baseline-  There were orders to reduce metolazone but appears  she had already been taken off metolazone Diuretics are  now down to Demadex 100 mg a day   she did improve-- blood pressures appeared to be stable--per chart review systolics in the 423-536R range--- she was participating apparently well with therapy  She still had significant renal issues--with renal panel showing a BUN of 118 and creatinine of 7.07 on lab done 2 days ago   Nephrology was made aware and recommendation was to monitor for variation in condition update labs tomorrow  She appeared to be doing well this weekend and even earlier this  morning--but apparently was doing personal care and washing her face she had what nursing staff described as somewhat of a seizure episode-- eyes rolling back in her head-decreased responsiveness--and then apparently snoring  After couple minutes apparently she became more responsive--by the time I arrived in room she was answering questions slowly but appropriately  Blood pressure was noted to be low with a systolic high 44R- O2 saturation was satisfactory at 96%--blood sugar was in the 200s-- pulse appear to be around 80 and largely regular-she did not really complain of shortness of breath or chest pain-but did say she felt really weak   Past Medical History:  Diagnosis Date  . Acid reflux   . Anemia   . Arthritis   . Axillary masses    Soft tissue - status post excision  . Back pain   . Chronic kidney disease   . Diastolic heart failure ([place])   . Essential hypertension   . Ischemic heart disease    Abnormal Myoview April 2018 - medical therapy  . Mixed hyperlipidemia   . Obesity   . Pancreatitis   . Sleep apnea    Noncompliant with CPAP  . Type 2 diabetes mellitus, uncontrolled ([place])         Past Surgical History:  Procedure Laterality Date  . ABDOMINAL HYSTERECTOMY    . AV FISTULA PLACEMENT Left 09/02/2017   Procedure: creation of left arm ARTERIOVENOUS (AV) FISTULA;  Surgeon: Serafina Mitchell, MD;  Location: [place];  Service: Vascular;  Laterality: Left;  . COLONOSCOPY  2008   Dr. Oneida Alar: normal   . COLONOSCOPY N/A 12/18/2016  Dr. Oneida Alar: multiple tubular adenomas, internal hemorrhoids. Surveillance in 3 years   . ESOPHAGEAL DILATION N/A 10/13/2015   Procedure: ESOPHAGEAL DILATION;  Surgeon: Rogene Houston, MD;  [facility];  Service: Endoscopy;  Laterality: N/A;  . ESOPHAGOGASTRODUODENOSCOPY N/A 10/13/2015   Dr. Laural Golden: chronic gastritis on path, no H.pylori. Empiric dilation   . ESOPHAGOGASTRODUODENOSCOPY N/A 12/18/2016   Dr.  Oneida Alar: mild gastritis. BRAVO study revealed uncontrolled GERD. Dysphagia secondary to uncontrolled reflux  . FOOT SURGERY Bilateral   . LUNG BIOPSY    . MASS EXCISION Right 01/09/2013   Procedure: EXCISION OF NEOPLASM OF RIGHT  AXILLA  AND EXCISION OF NEOPLASM OF LEFT AXILLA;  Surgeon: Jamesetta So, MD;  Location: [facility];  Service: General;  Laterality: Right;  procedure end @ 08:23  . MYRINGOTOMY WITH TUBE PLACEMENT Bilateral 04/28/2017   Procedure: BILATERAL MYRINGOTOMY WITH TUBE PLACEMENT;  Surgeon: Leta Baptist, MD;  Location: [place];  Service: ENT;  Laterality: Bilateral;  . SAVORY DILATION N/A 12/18/2016   Procedure: SAVORY DILATION;  Surgeon: Danie Binder, MD;  [facility]: [facility];  Service: Endoscopy;  Laterality: N/A;         Allergies  Allergen Reactions  . Ace Inhibitors Anaphylaxis and Swelling  . Penicillins Itching, Swelling and Other (See Comments)    SWELLING REACTION UNSPECIFIED   PATIENT HAS HAD A PCN REACTION WITH IMMEDIATE RASH, FACIAL/TONGUE/THROAT SWELLING, SOB, OR LIGHTHEADEDNESS WITH HYPOTENSION:  #  #  YES  #  #  Has patient had a PCN reaction causing severe rash involving mucus membranes or skin necrosis: No Has patient had a PCN reaction that required hospitalization No Has patient had a PCN reaction occurring within the last 10 years: No If all of the above answers are "NO", then may proceed with Cephalosporin use.   . Statins Other (See Comments)    elevated LFT's  . Albuterol Swelling    SWELLING REACTION UNSPECIFIED         Outpatient Encounter Medications as of 09/17/2017  Medication Sig  . acetaminophen (TYLENOL) 500 MG tablet Take 1,000 mg by mouth every 6 (six) hours as needed for moderate pain or headache.  Marland Kitchen aspirin EC 81 MG EC tablet Take 1 tablet (81 mg total) by mouth daily. With Food  . atorvastatin (LIPITOR) 20 MG tablet Take 1 tablet (20 mg total) by mouth daily.  Marland Kitchen azelastine (ASTELIN) 0.1 % nasal spray Place 2 sprays  into both nostrils 2 (two) times daily. Use in each nostril as directed  . calcitRIOL (ROCALTROL) 0.25 MCG capsule Take 0.25 mcg by mouth every Monday, Wednesday, and Friday.   . Calcium Carbonate Antacid (CALCIUM CARBONATE, DOSED IN MG ELEMENTAL CALCIUM,) 1250 MG/5ML SUSP Take 5 mLs (500 mg of elemental calcium total) by mouth every 6 (six) hours as needed for indigestion.  . camphor-menthol (SARNA) lotion Apply 1 application topically every 8 (eight) hours as needed for itching.  . cloNIDine (CATAPRES) 0.1 MG tablet TAKE 1 TABLET BY MOUTH  EVERY EVENING AT 9PM FOR  BLOOD PRESSURE  . dexlansoprazole (DEXILANT) 60 MG capsule 1 PO EVERY MORNING WITH BREAKFAST.  Marland Kitchen dextromethorphan-guaiFENesin (MUCINEX DM) 30-600 MG 12hr tablet Take 1 tablet by mouth 2 (two) times daily.  Marland Kitchen dicyclomine (BENTYL) 10 MG capsule Take 10 mg by mouth 4 (four) times daily as needed for spasms.  Marland Kitchen ezetimibe (ZETIA) 10 MG tablet Take 1 tablet (10 mg total) by mouth daily.  Marland Kitchen gabapentin (NEURONTIN) 100 MG capsule Take 1  capsule (100 mg total) by mouth 2 (two) times daily.  . hydrOXYzine (ATARAX/VISTARIL) 25 MG tablet Take 1 tablet (25 mg total) by mouth every 8 (eight) hours as needed for itching.  . insulin aspart (NOVOLOG) 100 UNIT/ML injection Inject 8 Units into the skin 3 (three) times daily with meals.  . insulin detemir (LEVEMIR) 100 UNIT/ML injection Inject 0.55 mLs (55 Units total) into the skin 2 (two) times daily.  . isosorbide mononitrate (IMDUR) 30 MG 24 hr tablet Take 1 tablet (30 mg total) by mouth daily.  Marland Kitchen labetalol (NORMODYNE) 200 MG tablet Take 2 tablets (400 mg total) by mouth 2 (two) times daily.  . methocarbamol (ROBAXIN) 500 MG tablet Take 1 tablet (500 mg total) by mouth every 8 (eight) hours as needed for muscle spasms.  . ondansetron (ZOFRAN ODT) 4 MG disintegrating tablet Take 1 tablet (4 mg total) by mouth every 8 (eight) hours as needed for nausea or vomiting.  . senna-docusate (SENOKOT-S) 8.6-50 MG  tablet Take 2 tablets by mouth at bedtime as needed for mild constipation or moderate constipation.  . sertraline (ZOLOFT) 25 MG tablet Take 25 mg by mouth daily.  Marland Kitchen torsemide (DEMADEX) 100 MG tablet Take 100 mg by mouth daily.  . traMADol (ULTRAM) 50 MG tablet Take 25 mg by mouth every 6 (six) hours as needed for moderate pain or severe pain.   . Vitamin D, Ergocalciferol, (DRISDOL) 50000 units CAPS capsule Take 1 capsule (50,000 Units total) by mouth every 7 (seven) days.  . [DISCONTINUED] FLUoxetine (PROZAC) 10 MG capsule Take 10 mg by mouth daily.    . [DISCONTINUED] glipiZIDE (GLUCOTROL) 10 MG tablet Take 10 mg by mouth 2 (two) times daily before a meal.    . [DISCONTINUED] metolazone (ZAROXOLYN) 5 MG tablet Take 1 tablet 5 mg every Monday, Wednesday, Friday (Patient taking differently: Take 5 mg by mouth See admin instructions. Take 1 tablet 5 mg every Sunday, Monday, Wednesday, Friday)  . [DISCONTINUED] potassium chloride (KLOR-CON) 20 MEQ packet Take 40 mEq by mouth daily.  . [DISCONTINUED] torsemide (DEMADEX) 100 MG tablet Take 1 tablet (100 mg total) by mouth 2 (two) times daily. (Patient taking differently: Take 100 mg by mouth daily. )   No facility-administered encounter medications on file as of 09/17/2017.     Review of systems  This is limited since patient is not speaking much --but she is denying chest pain or shortness of breath--her main complaint is she feels really weak    Physical exam.  Temperature  97.2 pulse 68 respirations 18 blood pressure 77/53-O2 saturation was 96%  In general this is a weak appearing elderly female does not appear to be in acute distress but very weak-  Her skin is warm and slightly clammy.  Eyes visual acuity appears intact sclera and conjunctive are clear.  Oropharynx appears relatively clear tongue is midline with what appears to be full range of motion.  Chest is clear to auscultation with somewhat poor respiratory effort-there is  no labored breathing  Heart is largely regular rate and rhythm somewhat distant heart sounds  Abdomen is obese soft nontender with somewhat hypoactive bowel sounds agraffe  Musculoskeletal appears able to move her extremities but quite weak- grip strength appears to be intact  Neurologic she is now alert but somewhat slow to speak cannot really appreciate true lateralizing findings.  Psych-she appears alert largely oriented but is slow to speak but does respond appropriately   Labs.  September 17, 2017  Sodium 141  potassium 4.6 CO2 level 24  BUN 118--creatinine 7.07  September 16, 2017    WBC 10.6 hemoglobin 9.3 platelets 555   Assessment and plan.  1.  Change in mental status-question seizure-question syncopal episode---with history of stage V chronic kidney disease-- we will send her to the ER for expedient evaluation.  Patient status continues to be quite fragile with her renal issues-complicated with what appears to be at times hypotensive episodes-  She is now alert and responding--but cannot rule out an acute process  [id number] -

## 2017-09-19 NOTE — ED Provider Notes (Signed)
[facility] EMERGENCY DEPARTMENT Provider Note   CSN: [id number] Arrival date & time: 09/19/17  1134     History   Chief Complaint Chief Complaint  Patient presents with  . Loss of Consciousness    HPI Kathryn Beck is a 62 y.o. female.  HPI  Pt was seen at 1345. Per NH report and pt: c/o sudden onset and resolution of one episode of syncope that occurred today PTA. Pt states she was sitting on the commode after having a BM "then I just woke up sitting on the commode." NH staff states pt's "eyes rolled back," she had snoring resps, was diaphoretic and hypotensive during this episode. BP was "77/53." Pt did not fall off the commode.  Pt states she "just feels weak all over" on arrival to ED. Pt was evaluated in the ED 3 days ago for similar symptoms, with diuretic medications adjusted due to orthostatic hypotension. Pt states she is planning on started dialysis, but has not yet. AV fistula was placed 09/02/2017 and pt has not had PD catheter placed yet. Denies CP/SOB, no abd pain, no N/V/D, no focal motor weakness, no tingling/numbness in extremities.   Past Medical History:  Diagnosis Date  . Acid reflux   . Anemia   . Arthritis   . Axillary masses    Soft tissue - status post excision  . Back pain   . Chronic kidney disease   . Diastolic heart failure ([place])   . Essential hypertension   . Ischemic heart disease    Abnormal Myoview April 2018 - medical therapy  . Mixed hyperlipidemia   . Obesity   . Pancreatitis   . Sleep apnea    Noncompliant with CPAP  . Type 2 diabetes mellitus, uncontrolled ([place])     Patient Active Problem List   Diagnosis Date Noted  . Bilateral leg weakness 09/09/2017  . Adrenal adenoma 09/03/2017  . Acute exacerbation of CHF (congestive heart failure) ([place]) 08/31/2017  . Edema due to hypoalbuminemia 08/31/2017  . Shortness of breath   . Community acquired pneumonia 08/18/2017  . Uncontrolled type 2 diabetes mellitus with nephropathy ([place])  08/18/2017  . Acute renal failure superimposed on stage 3 chronic kidney disease ([place]) 08/18/2017  . Unsteady gait 07/11/2017  . Recurrent falls 07/11/2017  . Anemia of chronic disease 03/24/2017  . History of colonic polyps 02/10/2017  . Dysphagia 11/05/2016  . IBS (irritable bowel syndrome) 02/04/2016  . Hospital discharge follow-up 10/27/2015  . Personal history of noncompliance with medical treatment, presenting hazards to health 10/21/2015  . Postprandial epigastric pain   . Heat sensitivity 10/07/2014  . Cardiac murmur 01/17/2014  . Hyperuricemia 08/04/2013  . Seasonal allergies 03/10/2013  . Lipoma of back 12/22/2012  . GERD (gastroesophageal reflux disease) 08/22/2012  . Sleep apnea 06/02/2011  . CKD (chronic kidney disease) stage 5, GFR less than 15 ml/min ([facility]) 01/13/2011  . BACK PAIN WITH RADICULOPATHY 04/16/2010  . FATIGUE 07/24/2009  . LIVER FUNCTION TESTS, ABNORMAL, HX OF 05/08/2009  . LUPUS ERYTHEMATOSUS, DISCOID 06/05/2008  . Uncontrolled diabetes mellitus with diabetic nephropathy, with long-term current use of insulin ([place]) 06/07/2007  . HLD (hyperlipidemia) 06/07/2007  . Obesity 06/07/2007  . Malignant hypertension 06/07/2007    Past Surgical History:  Procedure Laterality Date  . ABDOMINAL HYSTERECTOMY    . AV FISTULA PLACEMENT Left 09/02/2017   Procedure: creation of left arm ARTERIOVENOUS (AV) FISTULA;  Surgeon: Serafina Mitchell, MD;  Location: [place];  Service: Vascular;  Laterality: Left;  .  COLONOSCOPY  2008   Dr. Oneida Alar: normal   . COLONOSCOPY N/A 12/18/2016   Dr. Oneida Alar: multiple tubular adenomas, internal hemorrhoids. Surveillance in 3 years   . ESOPHAGEAL DILATION N/A 10/13/2015   Procedure: ESOPHAGEAL DILATION;  Surgeon: Rogene Houston, MD;  [facility];  Service: Endoscopy;  Laterality: N/A;  . ESOPHAGOGASTRODUODENOSCOPY N/A 10/13/2015   Dr. Laural Golden: chronic gastritis on path, no H.pylori. Empiric dilation   . ESOPHAGOGASTRODUODENOSCOPY  N/A 12/18/2016   Dr. Oneida Alar: mild gastritis. BRAVO study revealed uncontrolled GERD. Dysphagia secondary to uncontrolled reflux  . FOOT SURGERY Bilateral   . LUNG BIOPSY    . MASS EXCISION Right 01/09/2013   Procedure: EXCISION OF NEOPLASM OF RIGHT  AXILLA  AND EXCISION OF NEOPLASM OF LEFT AXILLA;  Surgeon: Jamesetta So, MD;  Location: [facility];  Service: General;  Laterality: Right;  procedure end @ 08:23  . MYRINGOTOMY WITH TUBE PLACEMENT Bilateral 04/28/2017   Procedure: BILATERAL MYRINGOTOMY WITH TUBE PLACEMENT;  Surgeon: Leta Baptist, MD;  Location: [place];  Service: ENT;  Laterality: Bilateral;  . SAVORY DILATION N/A 12/18/2016   Procedure: SAVORY DILATION;  Surgeon: Danie Binder, MD;  Location: [facility];  Service: Endoscopy;  Laterality: N/A;     OB History   None      Home Medications    Prior to Admission medications   Medication Sig Start Date End Date Taking? Authorizing Provider  acetaminophen (TYLENOL) 500 MG tablet Take 1,000 mg by mouth every 6 (six) hours as needed for moderate pain or headache.   Yes [provider]  aspirin EC 81 MG EC tablet Take 1 tablet (81 mg total) by mouth daily. With Food 09/09/17  Yes Emokpae, Courage, MD  atorvastatin (LIPITOR) 20 MG tablet Take 1 tablet (20 mg total) by mouth daily. 05/31/17  Yes Fayrene Helper, MD  azelastine (ASTELIN) 0.1 % nasal spray Place 2 sprays into both nostrils 2 (two) times daily. Use in each nostril as directed 05/31/17  Yes Fayrene Helper, MD  calcitRIOL (ROCALTROL) 0.25 MCG capsule Take 0.25 mcg by mouth every Monday, Wednesday, and Friday.  09/15/16  Yes Fayrene Helper, MD  Calcium Carbonate Antacid (CALCIUM CARBONATE, DOSED IN MG ELEMENTAL CALCIUM,) 1250 MG/5ML SUSP Take 5 mLs (500 mg of elemental calcium total) by mouth every 6 (six) hours as needed for indigestion. 09/08/17  Yes Roxan Hockey, MD  camphor-menthol (SARNA) lotion Apply 1 application topically every 8 (eight) hours as needed  for itching. 09/08/17  Yes Emokpae, Courage, MD  cloNIDine (CATAPRES) 0.1 MG tablet TAKE 1 TABLET BY MOUTH  EVERY EVENING AT 9PM FOR  BLOOD PRESSURE 05/31/17  Yes Fayrene Helper, MD  dexlansoprazole (DEXILANT) 60 MG capsule 1 PO EVERY MORNING WITH BREAKFAST. 04/15/17  Yes Fields, Sandi L, MD  dextromethorphan-guaiFENesin (MUCINEX DM) 30-600 MG 12hr tablet Take 1 tablet by mouth 2 (two) times daily. 08/20/17  Yes Barton Dubois, MD  dicyclomine (BENTYL) 10 MG capsule Take 10 mg by mouth 4 (four) times daily as needed for spasms.   Yes [provider]  ezetimibe (ZETIA) 10 MG tablet Take 1 tablet (10 mg total) by mouth daily. 05/31/17  Yes Fayrene Helper, MD  gabapentin (NEURONTIN) 100 MG capsule Take 1 capsule (100 mg total) by mouth 2 (two) times daily. 09/08/17  Yes Roxan Hockey, MD  hydrOXYzine (ATARAX/VISTARIL) 25 MG tablet Take 1 tablet (25 mg total) by mouth every 8 (eight) hours as needed for itching. 09/08/17  Yes Emokpae, Courage, MD  insulin aspart (NOVOLOG) 100 UNIT/ML injection Inject 8 Units into the skin 3 (three) times daily with meals.   Yes [provider]  insulin detemir (LEVEMIR) 100 UNIT/ML injection Inject 0.55 mLs (55 Units total) into the skin 2 (two) times daily. 09/08/17  Yes Emokpae, Courage, MD  isosorbide mononitrate (IMDUR) 30 MG 24 hr tablet Take 1 tablet (30 mg total) by mouth daily. 05/31/17  Yes Fayrene Helper, MD  labetalol (NORMODYNE) 200 MG tablet Take 2 tablets (400 mg total) by mouth 2 (two) times daily. 05/31/17  Yes Fayrene Helper, MD  methocarbamol (ROBAXIN) 500 MG tablet Take 1 tablet (500 mg total) by mouth every 8 (eight) hours as needed for muscle spasms. 08/20/17  Yes Barton Dubois, MD  ondansetron (ZOFRAN ODT) 4 MG disintegrating tablet Take 1 tablet (4 mg total) by mouth every 8 (eight) hours as needed for nausea or vomiting. 09/08/17  Yes Emokpae, Courage, MD  senna-docusate (SENOKOT-S) 8.6-50 MG tablet Take 2 tablets by  mouth at bedtime as needed for mild constipation or moderate constipation. 09/08/17 09/08/18 Yes Emokpae, Courage, MD  sertraline (ZOLOFT) 25 MG tablet Take 25 mg by mouth daily.   Yes [provider]  torsemide (DEMADEX) 100 MG tablet Take 100 mg by mouth daily.   Yes [provider]  Vitamin D, Ergocalciferol, (DRISDOL) 50000 units CAPS capsule Take 1 capsule (50,000 Units total) by mouth every 7 (seven) days. 07/08/17  Yes Fayrene Helper, MD  traMADol (ULTRAM) 50 MG tablet Take 25 mg by mouth every 6 (six) hours as needed for moderate pain or severe pain.     [provider]  FLUoxetine (PROZAC) 10 MG capsule Take 10 mg by mouth daily.    05/28/11  [provider]  glipiZIDE (GLUCOTROL) 10 MG tablet Take 10 mg by mouth 2 (two) times daily before a meal.    05/28/11  [provider]    Family History Family History  Problem Relation Age of Onset  . Hypertension Father   . Hypercholesterolemia Father   . Arthritis Father   . Hypertension Sister   . Hypercholesterolemia Sister   . Breast cancer Sister   . Hypertension Sister   . Colon cancer Neg Hx   . Colon polyps Neg Hx     Social History Social History   Tobacco Use  . Smoking status: Never Smoker  . Smokeless tobacco: Never Used  Substance Use Topics  . Alcohol use: No  . Drug use: No     Allergies   Ace inhibitors; Penicillins; Statins; and Albuterol   Review of Systems Review of Systems ROS: Statement: All systems negative except as marked or noted in the HPI; Constitutional: Negative for fever and chills. ; ; Eyes: Negative for eye pain, redness and discharge. ; ; ENMT: Negative for ear pain, hoarseness, nasal congestion, sinus pressure and sore throat. ; ; Cardiovascular: Negative for chest pain, palpitations, diaphoresis, dyspnea and peripheral edema. ; ; Respiratory: Negative for cough, wheezing and stridor. ; ; Gastrointestinal: Negative for nausea, vomiting, diarrhea,  abdominal pain, blood in stool, hematemesis, jaundice and rectal bleeding. . ; ; Genitourinary: Negative for dysuria, flank pain and hematuria. ; ; Musculoskeletal: Negative for back pain and neck pain. Negative for swelling and trauma.; ; Skin: Negative for pruritus, rash, abrasions, blisters, bruising and skin lesion.; ; Neuro: Negative for headache, lightheadedness and neck stiffness. Negative for extremity weakness, paresthesias, involuntary movement, seizure and +generalized weakness, syncope.  Physical Exam Updated Vital Signs BP 128/64   Pulse 80   Temp 97.9 F (36.6 C) (Oral)   Resp 15   Ht 5\' 4"  (1.626 m)   Wt 91.2 kg (201 lb)   SpO2 98%   BMI 34.50 kg/m    Patient Vitals for the past 24 hrs:  BP Temp Temp src Pulse Resp SpO2 Height Weight  09/19/17 1530 128/64 - - 80 15 98 % - -  09/19/17 1500 128/69 - - 77 16 96 % - -  09/19/17 1445 - - - 78 (!) 21 95 % - -  09/19/17 1430 - - - 75 12 97 % - -  09/19/17 1415 - - - 77 13 95 % - -  09/19/17 1400 136/67 - - 72 16 96 % - -  09/19/17 1345 - - - 71 19 96 % - -  09/19/17 1330 128/60 - - 72 14 96 % - -  09/19/17 1315 - - - 73 17 95 % - -  09/19/17 1300 (!) 120/48 - - 73 17 97 % - -  09/19/17 1139 123/65 97.9 F (36.6 C) Oral 74 14 95 % - -  09/19/17 1138 - - - - - - 5\' 4"  (1.626 m) 91.2 kg (201 lb)   14:32 Orthostatic Vital Signs VB  Orthostatic Lying   BP- Lying: 112/50   Pulse- Lying: 79       Orthostatic Sitting  BP- Sitting: 113/57   Pulse- Sitting: 81       Orthostatic Standing at 0 minutes  BP- Standing at 0 minutes: (Pt states she is unable to stand)      Physical Exam 1350: Physical examination:  Nursing notes reviewed; Vital signs and O2 SAT reviewed;  Constitutional: Well developed, Well nourished, Well hydrated, In no acute distress; Head:  Normocephalic, atraumatic. +mild edema to face.; Eyes: EOMI, PERRL, No scleral icterus; ENMT: Mouth and pharynx normal, Mucous membranes moist; Neck: Supple,  Full range of motion, No lymphadenopathy; Cardiovascular: Regular rate and rhythm, No gallop; Respiratory: Breath sounds clear & equal bilaterally, No wheezes.  Speaking full sentences with ease, Normal respiratory effort/excursion; Chest: Nontender, Movement normal; Abdomen: Soft, Nontender, Nondistended, Normal bowel sounds; Genitourinary: No CVA tenderness; Extremities: Peripheral pulses normal, No tenderness, +1 pedal edema, No calf asymmetry.; Neuro: AA&Ox3, Major CN grossly intact.  Speech clear. No gross focal motor or sensory deficits in extremities.; Skin: Color normal, Warm, Dry.   ED Treatments / Results  Labs (all labs ordered are listed, but only abnormal results are displayed)   EKG EKG Interpretation  Date/Time:  Sunday September 19 2017 11:39:35 EDT Ventricular Rate:  74 PR Interval:    QRS Duration: 86 QT Interval:  424 QTC Calculation: 471 R Axis:   71 Text Interpretation:  Sinus rhythm Borderline T abnormalities, lateral leads Baseline wander Artifact When compared with ECG of 09/16/2017 No significant change was found Confirmed by Francine Graven [phone]) on 09/19/2017 2:57:47 PM   Radiology   Procedures Procedures (including critical care time)  Medications Ordered in ED Medications - No data to display   Initial Impression / Assessment and Plan / ED Course  I have reviewed the triage vital signs and the nursing notes.  Pertinent labs & imaging results that were available during my care of the patient were reviewed by me and considered in my medical decision making (see chart for details).  MDM Reviewed: previous chart, nursing note and vitals Reviewed previous: labs and ECG Interpretation: labs,  ECG, x-ray and CT scan   Results for orders placed or performed during the hospital encounter of 09/13/74  Basic metabolic panel  Result Value Ref Range   Sodium 139 135 - 145 mmol/L   Potassium 4.2 3.5 - 5.1 mmol/L   Chloride 101 98 - 111 mmol/L   CO2 22 22 -  32 mmol/L   Glucose, Bld 322 (H) 70 - 99 mg/dL   BUN 116 (H) 8 - 23 mg/dL   Creatinine, Ser 7.02 (H) 0.44 - 1.00 mg/dL   Calcium 9.9 8.9 - 10.3 mg/dL   GFR calc non Af Amer 6 (L) >60 mL/min   GFR calc Af Amer 7 (L) >60 mL/min   Anion gap 16 (H) 5 - 15  CBC  Result Value Ref Range   WBC 8.8 4.0 - 10.5 K/uL   RBC 4.12 3.87 - 5.11 MIL/uL   Hemoglobin 9.4 (L) 12.0 - 15.0 g/dL   HCT 30.9 (L) 36.0 - 46.0 %   MCV 75.0 (L) 78.0 - 100.0 fL   MCH 22.8 (L) 26.0 - 34.0 pg   MCHC 30.4 30.0 - 36.0 g/dL   RDW 17.1 (H) 11.5 - 15.5 %   Platelets 499 (H) 150 - 400 K/uL  Urinalysis, Routine w reflex microscopic  Result Value Ref Range   Color, Urine YELLOW YELLOW   APPearance HAZY (A) CLEAR   Specific Gravity, Urine 1.015 1.005 - 1.030   pH 5.0 5.0 - 8.0   Glucose, UA 150 (A) NEGATIVE mg/dL   Hgb urine dipstick SMALL (A) NEGATIVE   Bilirubin Urine NEGATIVE NEGATIVE   Ketones, ur NEGATIVE NEGATIVE mg/dL   Protein, ur >=300 (A) NEGATIVE mg/dL   Nitrite NEGATIVE NEGATIVE   Leukocytes, UA NEGATIVE NEGATIVE   RBC / HPF 0-5 0 - 5 RBC/hpf   WBC, UA 0-5 0 - 5 WBC/hpf   Bacteria, UA RARE (A) NONE SEEN   Squamous Epithelial / LPF 11-20 0 - 5   Mucus PRESENT    Budding Yeast PRESENT    Hyaline Casts, UA PRESENT   Troponin I  Result Value Ref Range   Troponin I <0.03 <0.03 ng/mL  CBG monitoring, ED  Result Value Ref Range   Glucose-Capillary 282 (H) 70 - 99 mg/dL   Dg Chest 1 View Result Date: 09/19/2017 CLINICAL DATA:  Recent syncopal episode EXAM: CHEST  1 VIEW COMPARISON:  09/16/2017 FINDINGS: Cardiac shadow is stable. The lungs are well aerated bilaterally. No focal infiltrate or sizable effusion is seen. No acute bony abnormality is noted. IMPRESSION: No active disease. Electronically Signed   By: Inez Catalina M.D.   On: 09/19/2017 15:33    Ct Head Wo Contrast Result Date: 09/19/2017 CLINICAL DATA:  Syncopal episode during bowel movement EXAM: CT HEAD WITHOUT CONTRAST TECHNIQUE: Contiguous  axial images were obtained from the base of the skull through the vertex without intravenous contrast. COMPARISON:  05/28/2011 FINDINGS: Brain: No evidence of acute infarction, hemorrhage, hydrocephalus, extra-axial collection or mass lesion/mass effect. Vascular: No hyperdense vessel or unexpected calcification. Skull: Normal. Negative for fracture or focal lesion. Sinuses/Orbits: No acute finding. Other: None. IMPRESSION: Normal head CT for age Electronically Signed   By: Inez Catalina M.D.   On: 09/19/2017 15:31   Results for Kathryn Beck, Kathryn Beck (MRN [id number]) as of 09/19/2017 16:36  Ref. Range 09/08/2017 04:54 09/13/2017 07:10 09/16/2017 15:03 09/17/2017 07:33 09/19/2017 11:44  BUN Latest Ref Range: 8 - 23 mg/dL 61 (H) 103 (H) 57 (H) 118 (H) 116 (H)  Creatinine Latest Ref Range: 0.44 - 1.00 mg/dL 4.33 (H) 5.89 (H) 7.03 (H) 7.07 (H) 7.02 (H)    1555:  BUN/Cr gradually increasing. No acute fluid overload on CXR and electrolytes normal. No resp distress. Neuro exam remains intact. Pt unable to stand for orthostatic VS due to generalized weakness. T/C returned from [facility] Dr. Lowanda Foster, case discussed, including:  HPI, pertinent PM/SHx, VS/PE, dx testing, ED course and treatment:  No emergent need for HD at this time, he will consult.   1600:  T/C returned from [facility] Dr. Hampton Abbot, case discussed, including:  HPI, pertinent PM/SHx, VS/PE, dx testing, ED course and treatment:  Agreeable to admit.      Final Clinical Impressions(s) / ED Diagnoses   Final diagnoses:  Syncope    ED Discharge Orders    None       Francine Graven, DO 09/23/17 1442

## 2017-09-19 NOTE — Consult Note (Signed)
Reason for Consult: Acute in chronic rnal failure Referring Physician: Wing Gfeller is an 62 y.o. female.  HPI: She is a patient with history of sleep apnea, Diabetes,ischemic heart diseas, chronic renal failure stge V being follwed by [place] [facility] presently came with complaint of syncopy and weeknes. When evaluated patient was hypotesnive with significanlty elavate BUN and creatinine hence consult is called. Patient denies any nausea or vomiting. Her appetite is good. No difficulty in brreathing  Past Medical History:  Diagnosis Date  . Acid reflux   . Anemia   . Arthritis   . Axillary masses    Soft tissue - status post excision  . Back pain   . Chronic kidney disease   . Diastolic heart failure ([place])   . Essential hypertension   . Ischemic heart disease    Abnormal Myoview April 2018 - medical therapy  . Mixed hyperlipidemia   . Obesity   . Pancreatitis   . Sleep apnea    Noncompliant with CPAP  . Type 2 diabetes mellitus, uncontrolled ([place])     Past Surgical History:  Procedure Laterality Date  . ABDOMINAL HYSTERECTOMY    . AV FISTULA PLACEMENT Left 09/02/2017   Procedure: creation of left arm ARTERIOVENOUS (AV) FISTULA;  Surgeon: Serafina Mitchell, MD;  Location: [facility] OR;  Service: Vascular;  Laterality: Left;  . COLONOSCOPY  2008   Dr. Oneida Alar: normal   . COLONOSCOPY N/A 12/18/2016   Dr. Oneida Alar: multiple tubular adenomas, internal hemorrhoids. Surveillance in 3 years   . ESOPHAGEAL DILATION N/A 10/13/2015   Procedure: ESOPHAGEAL DILATION;  Surgeon: Rogene Houston, MD;  [facility]: [facility];  Service: Endoscopy;  Laterality: N/A;  . ESOPHAGOGASTRODUODENOSCOPY N/A 10/13/2015   Dr. Laural Golden: chronic gastritis on path, no H.pylori. Empiric dilation   . ESOPHAGOGASTRODUODENOSCOPY N/A 12/18/2016   Dr. Oneida Alar: mild gastritis. BRAVO study revealed uncontrolled GERD. Dysphagia secondary to uncontrolled reflux  . FOOT SURGERY Bilateral   . LUNG BIOPSY    .  MASS EXCISION Right 01/09/2013   Procedure: EXCISION OF NEOPLASM OF RIGHT  AXILLA  AND EXCISION OF NEOPLASM OF LEFT AXILLA;  Surgeon: Jamesetta So, MD;  Location: [facility] ORS;  Service: General;  Laterality: Right;  procedure end @ 08:23  . MYRINGOTOMY WITH TUBE PLACEMENT Bilateral 04/28/2017   Procedure: BILATERAL MYRINGOTOMY WITH TUBE PLACEMENT;  Surgeon: Leta Baptist, MD;  Location: [facility];  Service: ENT;  Laterality: Bilateral;  . SAVORY DILATION N/A 12/18/2016   Procedure: SAVORY DILATION;  Surgeon: Danie Binder, MD;  [facility];  Service: Endoscopy;  Laterality: N/A;    Family History  Problem Relation Age of Onset  . Hypertension Father   . Hypercholesterolemia Father   . Arthritis Father   . Hypertension Sister   . Hypercholesterolemia Sister   . Breast cancer Sister   . Hypertension Sister   . Colon cancer Neg Hx   . Colon polyps Neg Hx     Social History:  reports that she has never smoked. She has never used smokeless tobacco. She reports that she does not drink alcohol or use drugs.  Allergies:  Allergies  Allergen Reactions  . Ace Inhibitors Anaphylaxis and Swelling  . Penicillins Itching, Swelling and Other (See Comments)    SWELLING REACTION UNSPECIFIED   PATIENT HAS HAD A PCN REACTION WITH IMMEDIATE RASH, FACIAL/TONGUE/THROAT SWELLING, SOB, OR LIGHTHEADEDNESS WITH HYPOTENSION:  #  #  YES  #  #  Has patient had a  PCN reaction causing severe rash involving mucus membranes or skin necrosis: No Has patient had a PCN reaction that required hospitalization No Has patient had a PCN reaction occurring within the last 10 years: No If all of the above answers are "NO", then may proceed with Cephalosporin use.   . Statins Other (See Comments)    elevated LFT's  . Albuterol Swelling    SWELLING REACTION UNSPECIFIED     Medications: I have reviewed the patient's current medications.  Results for orders placed or performed during the hospital encounter of 09/19/17  (from the past 48 hour(s))  CBG monitoring, ED     Status: Abnormal   Collection Time: 09/19/17 11:37 AM  Result Value Ref Range   Glucose-Capillary 282 (H) 70 - 99 mg/dL  Basic metabolic panel     Status: Abnormal   Collection Time: 09/19/17 11:44 AM  Result Value Ref Range   Sodium 139 135 - 145 mmol/L   Potassium 4.2 3.5 - 5.1 mmol/L   Chloride 101 98 - 111 mmol/L    Comment: Please note change in reference range.   CO2 22 22 - 32 mmol/L   Glucose, Bld 322 (H) 70 - 99 mg/dL    Comment: Please note change in reference range.   BUN 116 (H) 8 - 23 mg/dL    Comment: RESULTS CONFIRMED BY MANUAL DILUTION   Creatinine, Ser 7.02 (H) 0.44 - 1.00 mg/dL   Calcium 9.9 8.9 - 10.3 mg/dL   GFR calc non Af Amer 6 (L) >60 mL/min   GFR calc Af Amer 7 (L) >60 mL/min    Comment: (NOTE) The eGFR has been calculated using the CKD EPI equation. This calculation has not been validated in all clinical situations. eGFR's persistently <60 mL/min signify possible Chronic Kidney Disease.    Anion gap 16 (H) 5 - 15    Comment: Performed at [facility], [address], [place], [place] [address]  CBC     Status: Abnormal   Collection Time: 09/19/17 11:44 AM  Result Value Ref Range   WBC 8.8 4.0 - 10.5 K/uL   RBC 4.12 3.87 - 5.11 MIL/uL   Hemoglobin 9.4 (L) 12.0 - 15.0 g/dL   HCT 30.9 (L) 36.0 - 46.0 %   MCV 75.0 (L) 78.0 - 100.0 fL   MCH 22.8 (L) 26.0 - 34.0 pg   MCHC 30.4 30.0 - 36.0 g/dL   RDW 17.1 (H) 11.5 - 15.5 %   Platelets 499 (H) 150 - 400 K/uL    Comment: Performed at [facility], [address], [place], [place] [address]  Urinalysis, Routine w reflex microscopic     Status: Abnormal   Collection Time: 09/19/17 11:44 AM  Result Value Ref Range   Color, Urine YELLOW YELLOW   APPearance HAZY (A) CLEAR   Specific Gravity, Urine 1.015 1.005 - 1.030   pH 5.0 5.0 - 8.0   Glucose, UA 150 (A) NEGATIVE mg/dL   Hgb urine dipstick SMALL (A) NEGATIVE   Bilirubin Urine NEGATIVE NEGATIVE    Ketones, ur NEGATIVE NEGATIVE mg/dL   Protein, ur >=300 (A) NEGATIVE mg/dL   Nitrite NEGATIVE NEGATIVE   Leukocytes, UA NEGATIVE NEGATIVE   RBC / HPF 0-5 0 - 5 RBC/hpf   WBC, UA 0-5 0 - 5 WBC/hpf   Bacteria, UA RARE (A) NONE SEEN   Squamous Epithelial / LPF 11-20 0 - 5   Mucus PRESENT    Budding Yeast PRESENT    Hyaline Casts, UA  PRESENT     Comment: Performed at [facility], [address], [place], [place] [address]  Troponin I     Status: None   Collection Time: 09/19/17  2:38 PM  Result Value Ref Range   Troponin I <0.03 <0.03 ng/mL    Comment: Performed at [facility], [address] [place],  [address]    Dg Chest 1 View  Result Date: 09/19/2017 CLINICAL DATA:  Recent syncopal episode EXAM: CHEST  1 VIEW COMPARISON:  09/16/2017 FINDINGS: Cardiac shadow is stable. The lungs are well aerated bilaterally. No focal infiltrate or sizable effusion is seen. No acute bony abnormality is noted. IMPRESSION: No active disease. Electronically Signed   By: Inez Catalina M.D.   On: 09/19/2017 15:33   Ct Head Wo Contrast  Result Date: 09/19/2017 CLINICAL DATA:  Syncopal episode during bowel movement EXAM: CT HEAD WITHOUT CONTRAST TECHNIQUE: Contiguous axial images were obtained from the base of the skull through the vertex without intravenous contrast. COMPARISON:  05/28/2011 FINDINGS: Brain: No evidence of acute infarction, hemorrhage, hydrocephalus, extra-axial collection or mass lesion/mass effect. Vascular: No hyperdense vessel or unexpected calcification. Skull: Normal. Negative for fracture or focal lesion. Sinuses/Orbits: No acute finding. Other: None. IMPRESSION: Normal head CT for age Electronically Signed   By: Inez Catalina M.D.   On: 09/19/2017 15:31    Review of Systems  Respiratory: Negative for shortness of breath.   Cardiovascular: Negative for orthopnea.  Gastrointestinal: Negative for nausea and vomiting.  Neurological: Positive for dizziness and weakness.        Syncopal episode   Blood pressure 118/64, pulse 80, temperature 97.9 F (36.6 C), temperature source Oral, resp. rate 12, height 5' 4"  (1.626 m), weight 91.2 kg (201 lb), SpO2 99 %. Physical Exam  Constitutional: She is oriented to person, place, and time. No distress.  Neck: No JVD present.  Cardiovascular: Regular rhythm.  Respiratory: No respiratory distress. She has no wheezes.  GI: She exhibits no distension. There is no tenderness.  Musculoskeletal: She exhibits no edema.  Neurological: She is alert and oriented to person, place, and time.    Assessment/Plan: 1] Acute kidney injury super imposed on chronic. Her creatinine has been increasing slowly for the last couple of  From 3.49 on 08/19/17 to 4.63 on 09/03/17 about 2 weeks ago to presnt level of 7.1. Possibly due to prerenal synderome/ATN 2] Chronic renal failure: Possibly stage V. Patient with new fistula. Etiology Diabetes/Hypertension 3] Hypotension : Patient on multiple antihypertenisve medication 4]DM 5] Sleep apnea: On CPAP 6] Weekness and syncope: ? hypotesion 7] Anemia Plan: We will hold all antihypertensive medication 2]D/C Demadex 3] Normal saline at 125 cc/hr 4] We will consider diuretics if urine out is inadquate 5]  Renal panel in am 6] If renal function does not recover we will consider initiating hemodialysis    Marijke Guadiana S 09/19/2017, 4:48 PM

## 2017-09-19 NOTE — H&P (Signed)
History and Physical  Kathryn Beck XLK:[id number] DOB: [birth year] DOA: 09/19/2017 1134  PCP: Fayrene Helper, MD  Outpatient Specialists: Francene Finders (nephrology)  Chief Complaint: syncope and hypotension  HPI: Kathryn Beck is a 62 y.o. female with hx of HFpEF, OSA (not on CPAP), CKD stage 4-5 (new AV fistula in LUE placed ~ 2 weeks ago), IDDM who presented with recurrent syncope x 2 episodes in the past 3 days. Patient had a recent hospital admission for (HFpEF) CHF exacerbation and underwent IV diuresis and was on maintenance torsemide 100mg  daily, then discharged to [facility] SNF due to overall weakness and deconditioning. The first episode was while she was taking a bath (witnessed, while in the bath tub); this time, she was on the toilet when she felt acutely dizzy then lost consciousness. Regained consciousness within 2 minutes. Initial concern for seizure due to patient's "eyes rolling back" during the event. However, she reportedly was responsive within 5 minutes of the event. No incontinence or tongue biting reported. Her BP was notably 70s/50s.  Still makes urine although reports UOP has decreased over the past week. Also notes reduced appetite. Denies fever, cough or dysuria. No GI sx. No chest pain or palpitations. She reports that over the past few weeks she has had near-syncope and dizziness with postural changes ie lying to sitting.   Regarding her CKD approaching ESRD -- she has AV fistula placed on 09/02/17 but has been in discussion to start peritoneal dialysis and would prefer PD over HD.    ED course: BP 123/65; T 97.9 HR 74; RR 14; SO2 95% on RA; weight 201 lbs   Review of Systems: + syncope and orthostasis sx - no fevers/chills - no cough - no chest pain, dyspnea on exertion - no edema, PND, orthopnea - no nausea/vomiting; no tarry, melanotic or bloody stools - no dysuria;  - no weight changes  Rest of systems reviewed are negative, except as per above  history.   Past Medical History:  Diagnosis Date  . Acid reflux   . Anemia   . Arthritis   . Axillary masses    Soft tissue - status post excision  . Back pain   . Chronic kidney disease   . Diastolic heart failure ([place])   . Essential hypertension   . Ischemic heart disease    Abnormal Myoview April 2018 - medical therapy  . Mixed hyperlipidemia   . Obesity   . Pancreatitis   . Sleep apnea    Noncompliant with CPAP  . Type 2 diabetes mellitus, uncontrolled ([place])    Past Surgical History:  Procedure Laterality Date  . ABDOMINAL HYSTERECTOMY    . AV FISTULA PLACEMENT Left 09/02/2017   Procedure: creation of left arm ARTERIOVENOUS (AV) FISTULA;  Surgeon: Serafina Mitchell, MD;  Location: [facility] OR;  Service: Vascular;  Laterality: Left;  . COLONOSCOPY  2008   Dr. Oneida Alar: normal   . COLONOSCOPY N/A 12/18/2016   Dr. Oneida Alar: multiple tubular adenomas, internal hemorrhoids. Surveillance in 3 years   . ESOPHAGEAL DILATION N/A 10/13/2015   Procedure: ESOPHAGEAL DILATION;  Surgeon: Rogene Houston, MD;  Location: [facility];  Service: Endoscopy;  Laterality: N/A;  . ESOPHAGOGASTRODUODENOSCOPY N/A 10/13/2015   Dr. Laural Golden: chronic gastritis on path, no H.pylori. Empiric dilation   . ESOPHAGOGASTRODUODENOSCOPY N/A 12/18/2016   Dr. Oneida Alar: mild gastritis. BRAVO study revealed uncontrolled GERD. Dysphagia secondary to uncontrolled reflux  . FOOT SURGERY Bilateral   . LUNG BIOPSY    .  MASS EXCISION Right 01/09/2013   Procedure: EXCISION OF NEOPLASM OF RIGHT  AXILLA  AND EXCISION OF NEOPLASM OF LEFT AXILLA;  Surgeon: Jamesetta So, MD;  Location: [facility] ORS;  Service: General;  Laterality: Right;  procedure end @ 08:23  . MYRINGOTOMY WITH TUBE PLACEMENT Bilateral 04/28/2017   Procedure: BILATERAL MYRINGOTOMY WITH TUBE PLACEMENT;  Surgeon: Leta Baptist, MD;  Location: [place];  Service: ENT;  Laterality: Bilateral;  . SAVORY DILATION N/A 12/18/2016   Procedure: SAVORY DILATION;  Surgeon: Danie Binder, MD;   Location: [facility];  Service: Endoscopy;  Laterality: N/A;   Social History:  reports that she has never smoked. She has never used smokeless tobacco. She reports that she does not drink alcohol or use drugs.  Allergies  Allergen Reactions  . Ace Inhibitors Anaphylaxis and Swelling  . Penicillins Itching, Swelling and Other (See Comments)    SWELLING REACTION UNSPECIFIED   PATIENT HAS HAD A PCN REACTION WITH IMMEDIATE RASH, FACIAL/TONGUE/THROAT SWELLING, SOB, OR LIGHTHEADEDNESS WITH HYPOTENSION:  #  #  YES  #  #  Has patient had a PCN reaction causing severe rash involving mucus membranes or skin necrosis: No Has patient had a PCN reaction that required hospitalization No Has patient had a PCN reaction occurring within the last 10 years: No If all of the above answers are "NO", then may proceed with Cephalosporin use.   . Statins Other (See Comments)    elevated LFT's  . Albuterol Swelling    SWELLING REACTION UNSPECIFIED     Family History  Problem Relation Age of Onset  . Hypertension Father   . Hypercholesterolemia Father   . Arthritis Father   . Hypertension Sister   . Hypercholesterolemia Sister   . Breast cancer Sister   . Hypertension Sister   . Colon cancer Neg Hx   . Colon polyps Neg Hx       Prior to Admission medications   Medication Sig Start Date End Date Taking? Authorizing Provider  acetaminophen (TYLENOL) 500 MG tablet Take 1,000 mg by mouth every 6 (six) hours as needed for moderate pain or headache.   Yes [provider]  aspirin EC 81 MG EC tablet Take 1 tablet (81 mg total) by mouth daily. With Food 09/09/17  Yes Emokpae, Courage, MD  atorvastatin (LIPITOR) 20 MG tablet Take 1 tablet (20 mg total) by mouth daily. 05/31/17  Yes Fayrene Helper, MD  azelastine (ASTELIN) 0.1 % nasal spray Place 2 sprays into both nostrils 2 (two) times daily. Use in each nostril as directed 05/31/17  Yes Fayrene Helper, MD  calcitRIOL (ROCALTROL) 0.25 MCG  capsule Take 0.25 mcg by mouth every Monday, Wednesday, and Friday.  09/15/16  Yes Fayrene Helper, MD  Calcium Carbonate Antacid (CALCIUM CARBONATE, DOSED IN MG ELEMENTAL CALCIUM,) 1250 MG/5ML SUSP Take 5 mLs (500 mg of elemental calcium total) by mouth every 6 (six) hours as needed for indigestion. 09/08/17  Yes Roxan Hockey, MD  camphor-menthol (SARNA) lotion Apply 1 application topically every 8 (eight) hours as needed for itching. 09/08/17  Yes Emokpae, Courage, MD  cloNIDine (CATAPRES) 0.1 MG tablet TAKE 1 TABLET BY MOUTH  EVERY EVENING AT 9PM FOR  BLOOD PRESSURE 05/31/17  Yes Fayrene Helper, MD  dexlansoprazole (DEXILANT) 60 MG capsule 1 PO EVERY MORNING WITH BREAKFAST. 04/15/17  Yes Fields, Sandi L, MD  dextromethorphan-guaiFENesin (MUCINEX DM) 30-600 MG 12hr tablet Take 1 tablet by mouth 2 (two) times daily. 08/20/17  Yes  Barton Dubois, MD  dicyclomine (BENTYL) 10 MG capsule Take 10 mg by mouth 4 (four) times daily as needed for spasms.   Yes [provider]  ezetimibe (ZETIA) 10 MG tablet Take 1 tablet (10 mg total) by mouth daily. 05/31/17  Yes Fayrene Helper, MD  gabapentin (NEURONTIN) 100 MG capsule Take 1 capsule (100 mg total) by mouth 2 (two) times daily. 09/08/17  Yes Roxan Hockey, MD  hydrOXYzine (ATARAX/VISTARIL) 25 MG tablet Take 1 tablet (25 mg total) by mouth every 8 (eight) hours as needed for itching. 09/08/17  Yes Emokpae, Courage, MD  insulin aspart (NOVOLOG) 100 UNIT/ML injection Inject 8 Units into the skin 3 (three) times daily with meals.   Yes [provider]  insulin detemir (LEVEMIR) 100 UNIT/ML injection Inject 0.55 mLs (55 Units total) into the skin 2 (two) times daily. 09/08/17  Yes Emokpae, Courage, MD  isosorbide mononitrate (IMDUR) 30 MG 24 hr tablet Take 1 tablet (30 mg total) by mouth daily. 05/31/17  Yes Fayrene Helper, MD  labetalol (NORMODYNE) 200 MG tablet Take 2 tablets (400 mg total) by mouth 2 (two) times daily. 05/31/17   Yes Fayrene Helper, MD  methocarbamol (ROBAXIN) 500 MG tablet Take 1 tablet (500 mg total) by mouth every 8 (eight) hours as needed for muscle spasms. 08/20/17  Yes Barton Dubois, MD  ondansetron (ZOFRAN ODT) 4 MG disintegrating tablet Take 1 tablet (4 mg total) by mouth every 8 (eight) hours as needed for nausea or vomiting. 09/08/17  Yes Emokpae, Courage, MD  senna-docusate (SENOKOT-S) 8.6-50 MG tablet Take 2 tablets by mouth at bedtime as needed for mild constipation or moderate constipation. 09/08/17 09/08/18 Yes Emokpae, Courage, MD  sertraline (ZOLOFT) 25 MG tablet Take 25 mg by mouth daily.   Yes [provider]  torsemide (DEMADEX) 100 MG tablet Take 100 mg by mouth daily.   Yes [provider]  Vitamin D, Ergocalciferol, (DRISDOL) 50000 units CAPS capsule Take 1 capsule (50,000 Units total) by mouth every 7 (seven) days. 07/08/17  Yes Fayrene Helper, MD  traMADol (ULTRAM) 50 MG tablet Take 25 mg by mouth every 6 (six) hours as needed for moderate pain or severe pain.     [provider]  FLUoxetine (PROZAC) 10 MG capsule Take 10 mg by mouth daily.    05/28/11  [provider]  glipiZIDE (GLUCOTROL) 10 MG tablet Take 10 mg by mouth 2 (two) times daily before a meal.    05/28/11  [provider]    Temp:  [97.2 F (36.2 C)-98 F (36.7 C)] 97.2 F (36.2 C) (06/30 1208) Pulse Rate:  [68-84] 80 (06/30 1630) Resp:  [11-21] 12 (06/30 1630) BP: (77-140)/(48-80) 118/64 (06/30 1630) SpO2:  [95 %-99 %] 99 % (06/30 1630) Weight:  [91.2 kg (201 lb)] 91.2 kg (201 lb) (06/30 1138)  PHYSICAL EXAM:  BP 118/64   Pulse 80   Temp 97.9 F (36.6 C) (Oral)   Resp 12   Ht 5\' 4"  (1.626 m)   Wt 91.2 kg (201 lb)   SpO2 99%   BMI 34.50 kg/m   GEN obese young african-american female; resting in bed comfortably, appears chronically ill  HEENT NCAT EOM PERRL; clear oropharynx, no cervical LAD; moist mucus membranes  JVP estimated 4-5 cm H2O above RA; no HJR  ; no carotid bruits b/l ;  CV regular normal rate; normal S1 and S2; +1/9 LUSB systolic murmur, non radiating; no S3/S4; PMI non displaced; no parasternal  heave  RESP CTA b/l; breathing unlabored and symmetric  ABD soft NT ND +normoactive BS  EXT warm throughout b/l; no peripheral edema b/l  PULSES  DP and radials intact b/l; LUE AV fistula in place with faint thrill  SKIN/MSK no rashes or lesions  NEURO/PSYCH no focal deficits; AAOx4 and conversant   LABS ON ADMISSION:  Basic Metabolic Panel: Recent Labs  Lab 09/13/17 0710 09/16/17 1503 09/17/17 0733 09/19/17 1144  NA 139 141 141 139  K 5.0 4.6 4.6 4.2  CL 96* 99 102 101  CO2 27 26 24 22   GLUCOSE 289* 177* 275* 322*  BUN 103* 57* 118* 116*  CREATININE 5.89* 7.03* 7.07* 7.02*  CALCIUM 9.9 10.0 9.7 9.9  PHOS 7.9*  --  8.1*  --    CBC: Recent Labs  Lab 09/13/17 0710 09/16/17 1503 09/19/17 1144  WBC 9.2 10.6* 8.8  NEUTROABS  --  6.5  --   HGB 9.4* 9.3* 9.4*  HCT 31.8* 30.6* 30.9*  MCV 77.6* 75.9* 75.0*  PLT 610* 555* 499*   Liver Function Tests: Recent Labs  Lab 09/13/17 0710 09/17/17 0733  AST 34  --   ALT 38  --   ALKPHOS 160*  --   BILITOT 0.4  --   PROT 8.4*  --   ALBUMIN 2.8* 2.9*   No results for input(s): LIPASE, AMYLASE in the last 168 hours. No results for input(s): AMMONIA in the last 168 hours. Coagulation:  Lab Results  Component Value Date   INR 1.17 09/02/2017   No results found for: PTT Lactic Acid, Venous:     Component Value Date/Time   LATICACIDVEN 1.7 08/19/2017 0606   Cardiac Enzymes: Recent Labs  Lab 09/16/17 1503 09/19/17 1438  TROPONINI <0.03 <0.03    BNP (last 3 results) No results for input(s): PROBNP in the last 8760 hours. CBG: Recent Labs  Lab 09/16/17 1445 09/19/17 1137  GLUCAP 177* 282*    Radiological Exams on Admission: Dg Chest 1 View  Result Date: 09/19/2017 CLINICAL DATA:  Recent syncopal episode EXAM: CHEST  1 VIEW COMPARISON:  09/16/2017 FINDINGS:  Cardiac shadow is stable. The lungs are well aerated bilaterally. No focal infiltrate or sizable effusion is seen. No acute bony abnormality is noted. IMPRESSION: No active disease. Electronically Signed   By: Inez Catalina M.D.   On: 09/19/2017 15:33   Ct Head Wo Contrast  Result Date: 09/19/2017 CLINICAL DATA:  Syncopal episode during bowel movement EXAM: CT HEAD WITHOUT CONTRAST TECHNIQUE: Contiguous axial images were obtained from the base of the skull through the vertex without intravenous contrast. COMPARISON:  05/28/2011 FINDINGS: Brain: No evidence of acute infarction, hemorrhage, hydrocephalus, extra-axial collection or mass lesion/mass effect. Vascular: No hyperdense vessel or unexpected calcification. Skull: Normal. Negative for fracture or focal lesion. Sinuses/Orbits: No acute finding. Other: None. IMPRESSION: Normal head CT for age Electronically Signed   By: Inez Catalina M.D.   On: 09/19/2017 15:31    EKG: Independently reviewed. NSR with rate 74 bpm  Assessment/Plan  Kathryn Beck is a 62 y.o. female with hx of HFpEF, OSA (not on CPAP), CKD stage 4-5 (new AV fistula in LUE placed ~ 2 weeks ago), IDDM who had recent admission for lower extremity edema and was started on diuretics in May-June this year. Home regimen had included metolazone and toresemide. Although edema had resolved, she began experiencing syncope/near syncope likely secondary to orthostasis and documented low blood pressures with systolics as low as 15-72I. Creatinine also  jumped from her prior baseline in May-June of 3.5-4.2 to Cr 7.0 on this admission (with BUN in 110s). Suspect AKI on CKD due to overdiuresis and concurrent home anti-HTN regimen. Potassium is stable at 4.2. Still making urine although reports UOP has decreased to about ~300cc/day. Appears euvolemic to slightly dry on exam. Mentating appropriately. No evidence of infection at this time. Initial BP in 120/50s while in ED and no longer orthostatic. We will  hold her labetalol/clonidine/imdur and PO diuretics and do gentle hydration overnight. At this time, no acute indication for emergent dialysis; however pending renal function tomorrow, she may need initiation of dialysis on this admission via placement of a tunneled catheter. If renal function improves back closer to baseline of Cr ~ 4s, can then proceed with outpatient planning for PD, which patient has already discussed with nephrology. Of note, AV fistula has not matured yet.   Active Problems:   AKI (acute kidney injury) ([place])   # AKI on CKD - suspect prerenal due to recent diuretics  > Baseline Cr 3.7-4.5 in May-June. On admission Cr 7 and K 4.2. Dry weight estimated 202-205 lbs - discontinued home torsemide (100mg  qd)  - overnight NS at 100 cc/hr for total 8 hours = 800 cc - urine urea, Na, Cr pending - pending repeat BMP, will decide need for dialysis on this admission - repeat orthostats tomorrow - holding home anti-HTN meds as below - strict ins/outs and daily weights  # IDDM - home regimen 55 units levemir BID  - given renal function, starting at 40 units BID levemir and can uptitrate pending sugars - fingersticks ACHS and low dose sliding scale  # HTN - home regimen labetalol 400mg  BID; imdur 30mg  daily; clonidine 0.1mg  BID > hypotensive at SNF and normotensive at ED - holding all HTN meds as above - can add back lower dose of labetalol ie 200mg  bid if BP persistently increased > 150/90  # Neuropathic pain - on home gabapentin at 100mg  BID - reduced dosing to 100mg  qhs given AKI  # Hypocalcemia 2/2 CKD - resume calcitriol 0.25 mcg MWF  # Muscle spasms and chronic pain - robaxin 500mg  q6h prn for spasm - tramadol and tylenol prn  # Anemia 2/2 CKD  - stable, continue to monitor daily CBC   # HLD - resumed home atorvastatin and zetia  # Recent concern for depressive sx per PCP note, started on zoloft - resume sertraline at 25mg  daily  # Weakness, deconditioning.  Patient was receiving rehab at [facility] - PT/OT and SW consult ordered - will likely benefit from continued PT at SNF for discharge   DVT Prophylaxis: lovenox Code Status:  Full Code Family Communication: husband and daughter at bedside  Disposition Plan:   Extended Emergency Contact Information Primary Emergency Contact: Francis,Larry Address: [address]          [place], [place] [address] [place] Phone: [phone] Mobile Phone: [phone] Relation: Spouse Secondary Emergency Contact: Deon Pilling , [place] Phone: [phone] Mobile Phone: [phone] Relation: Daughter  Time spent: > 55 minutes  Colbert Ewing, MD [facility] Pager [phone]  If 7PM-7AM, please contact night-coverage [email or web address] Password [facility] 09/19/2017, 4:48 PM

## 2017-09-20 ENCOUNTER — Other Ambulatory Visit: Payer: Self-pay

## 2017-09-20 ENCOUNTER — Ambulatory Visit: Payer: Self-pay | Admitting: Cardiology

## 2017-09-20 DIAGNOSIS — I951 Orthostatic hypotension: Secondary | ICD-10-CM

## 2017-09-20 DIAGNOSIS — R55 Syncope and collapse: Secondary | ICD-10-CM

## 2017-09-20 DIAGNOSIS — N185 Chronic kidney disease, stage 5: Secondary | ICD-10-CM

## 2017-09-20 DIAGNOSIS — I5032 Chronic diastolic (congestive) heart failure: Secondary | ICD-10-CM

## 2017-09-20 LAB — GLUCOSE, CAPILLARY
GLUCOSE-CAPILLARY: 313 mg/dL — AB (ref 70–99)
GLUCOSE-CAPILLARY: 322 mg/dL — AB (ref 70–99)
Glucose-Capillary: 215 mg/dL — ABNORMAL HIGH (ref 70–99)
Glucose-Capillary: 264 mg/dL — ABNORMAL HIGH (ref 70–99)

## 2017-09-20 LAB — CBC
HCT: 30.6 % — ABNORMAL LOW (ref 36.0–46.0)
Hemoglobin: 9.3 g/dL — ABNORMAL LOW (ref 12.0–15.0)
MCH: 23 pg — ABNORMAL LOW (ref 26.0–34.0)
MCHC: 30.4 g/dL (ref 30.0–36.0)
MCV: 75.6 fL — ABNORMAL LOW (ref 78.0–100.0)
Platelets: 476 10*3/uL — ABNORMAL HIGH (ref 150–400)
RBC: 4.05 MIL/uL (ref 3.87–5.11)
RDW: 17 % — ABNORMAL HIGH (ref 11.5–15.5)
WBC: 9.8 10*3/uL (ref 4.0–10.5)

## 2017-09-20 LAB — RENAL FUNCTION PANEL
ALBUMIN: 2.9 g/dL — AB (ref 3.5–5.0)
ANION GAP: 15 (ref 5–15)
BUN: 116 mg/dL — ABNORMAL HIGH (ref 8–23)
CO2: 23 mmol/L (ref 22–32)
Calcium: 9.8 mg/dL (ref 8.9–10.3)
Chloride: 104 mmol/L (ref 98–111)
Creatinine, Ser: 6.45 mg/dL — ABNORMAL HIGH (ref 0.44–1.00)
GFR calc Af Amer: 7 mL/min — ABNORMAL LOW (ref >60)
GFR calc non Af Amer: 6 mL/min — ABNORMAL LOW (ref >60)
GLUCOSE: 210 mg/dL — AB (ref 70–99)
PHOSPHORUS: 6.3 mg/dL — AB (ref 2.5–4.6)
Potassium: 3.9 mmol/L (ref 3.5–5.1)
Sodium: 142 mmol/L (ref 135–145)

## 2017-09-20 LAB — URINE CULTURE: Culture: <10000 — AB

## 2017-09-20 LAB — MAGNESIUM: MAGNESIUM: 1.8 mg/dL (ref 1.7–2.4)

## 2017-09-20 MED ORDER — LABETALOL HCL 200 MG PO TABS
200.0000 mg | ORAL_TABLET | Freq: Two times a day (BID) | ORAL | Status: DC
  Administered 2017-09-20 – 2017-09-23 (×7): 200 mg via ORAL
  Filled 2017-09-20 (×7): qty 1

## 2017-09-20 MED ORDER — INSULIN DETEMIR 100 UNIT/ML ~~LOC~~ SOLN
45.0000 [IU] | Freq: Two times a day (BID) | SUBCUTANEOUS | Status: DC
  Administered 2017-09-20 – 2017-09-21 (×2): 45 [IU] via SUBCUTANEOUS
  Filled 2017-09-20 (×4): qty 0.45

## 2017-09-20 MED ORDER — FUROSEMIDE 10 MG/ML IJ SOLN
80.0000 mg | Freq: Two times a day (BID) | INTRAMUSCULAR | Status: DC
  Administered 2017-09-20 (×2): 80 mg via INTRAVENOUS
  Filled 2017-09-20 (×2): qty 8

## 2017-09-20 MED ORDER — SEVELAMER CARBONATE 800 MG PO TABS
800.0000 mg | ORAL_TABLET | ORAL | Status: DC
  Administered 2017-09-20: 800 mg via ORAL
  Filled 2017-09-20: qty 1

## 2017-09-20 MED ORDER — SEVELAMER CARBONATE 800 MG PO TABS
1600.0000 mg | ORAL_TABLET | Freq: Three times a day (TID) | ORAL | Status: DC
Start: 2017-09-20 — End: 2017-09-21
  Administered 2017-09-20 (×2): 1600 mg via ORAL
  Filled 2017-09-20 (×2): qty 2

## 2017-09-20 MED ORDER — ACETAMINOPHEN 325 MG PO TABS
650.0000 mg | ORAL_TABLET | Freq: Four times a day (QID) | ORAL | Status: DC | PRN
Start: 2017-09-20 — End: 2017-09-23
  Administered 2017-09-20: 650 mg via ORAL
  Filled 2017-09-20: qty 2

## 2017-09-20 NOTE — Progress Notes (Signed)
Pt has home CPAP. CPAP plugged into red outlet with 2L O2 in line. No signs of damage

## 2017-09-20 NOTE — Evaluation (Signed)
Physical Therapy Evaluation Patient Details Name: Kathryn Beck MRN: [id number] DOB: [birth year] Today's Date: 09/20/2017   History of Present Illness  Kathryn Beck is a 62 y.o. female with hx of HFpEF, OSA (not on CPAP), CKD stage 4-5 (new AV fistula in LUE placed ~ 2 weeks ago), IDDM who presented with recurrent syncope x 2 episodes in the past 3 days. Patient had a recent hospital admission for (HFpEF) CHF exacerbation and underwent IV diuresis and was on maintenance torsemide 100mg  daily, then discharged to [facility] SNF due to overall weakness and deconditioning. The first episode was while she was taking a bath (witnessed, while in the bath tub); this time, she was on the toilet when she felt acutely dizzy then lost consciousness. Regained consciousness within 2 minutes. Initial concern for seizure due to patient's "eyes rolling back" during the event. However, she reportedly was responsive within 5 minutes of the event. No incontinence or tongue biting reported. Her BP was notably 70s/50s.  Still makes urine although reports UOP has decreased over the past week. Also notes reduced appetite. Denies fever, cough or dysuria. No GI sx. No chest pain or palpitations. She reports that over the past few weeks she has had near-syncope and dizziness with postural changes ie lying to sitting  Clinical Impression  Pt states that it is to painful to stand.  Explained to pt that her pain is at a 10/10 already so she has nothing to lose but pt still will not attempt ambulation.    Follow Up Recommendations SNF    Equipment Recommendations  None recommended by PT    Recommendations for Other Services       Precautions / Restrictions Precautions Precautions: Fall Restrictions Weight Bearing Restrictions: No      Mobility  Bed Mobility Overal bed mobility: Needs Assistance Bed Mobility: Supine to Sit     Supine to sit: Supervision        Transfers Overall transfer level: Needs  assistance Equipment used: Rolling walker (2 wheeled) Transfers: Sit to/from Stand Sit to Stand: Mod assist         General transfer comment: bed at elevated postition; attempted to side step at bed but pt would not.    Ambulation/Gait             General Gait Details: Pt just transferred to recliner with minA from NT, declining further mobility/ambulation secondary to fatigue and BLE pain            Pertinent Vitals/Pain Pain Assessment: No/denies pain Faces Pain Scale: Hurts worst Pain Location: B LE  Pain Descriptors / Indicators: Aching Pain Intervention(s): Limited activity within patient's tolerance    Home Living Family/patient expects to be discharged to:: Private residence Living Arrangements: Spouse/significant other Available Help at Discharge: Family Type of Home: House Home Access: Stairs to enter   Technical brewer of Steps: 2 Home Layout: Able to live on main level with bedroom/bathroom        Prior Function Level of Independence: Needs assistance   Gait / Transfers Assistance Needed: for three weeks pt has borrowed her sisters walker due to pain in both legs left more than right.  Her legs swelled  and have been painful ever since   ADL's / Homemaking Assistance Needed: needs assistance            Extremity/Trunk Assessment        Lower Extremity Assessment Lower Extremity Assessment: Generalized weakness       Communication  Communication: No difficulties  Cognition Arousal/Alertness: Awake/alert Behavior During Therapy: WFL for tasks assessed/performed Overall Cognitive Status: Within Functional Limits for tasks assessed                                           Exercises General Exercises - Lower Extremity Ankle Circles/Pumps: 5 reps Quad Sets: 5 reps;Both Gluteal Sets: Both;5 reps Heel Slides: 5 reps;Both Hip ABduction/ADduction: 5 reps;Both Straight Leg Raises: 5 reps;Both   Assessment/Plan     PT Assessment Patient needs continued PT services  PT Problem List Decreased strength;Decreased activity tolerance;Decreased balance;Pain;Decreased mobility       PT Treatment Interventions Gait training;Therapeutic activities;Functional mobility training;Therapeutic exercise    PT Goals (Current goals can be found in the Care Plan section)  Acute Rehab PT Goals Patient Stated Goal: Get stronger with rehab before returning home; less BLE pain PT Goal Formulation: With patient Time For Goal Achievement: 09/14/17 Potential to Achieve Goals: Good    Frequency Min 5X/week           End of Session Equipment Utilized During Treatment: Gait belt Activity Tolerance: Patient tolerated treatment well Patient left: in bed   PT Visit Diagnosis: Unsteadiness on feet (R26.81);Repeated falls (R29.6);Muscle weakness (generalized) (M62.81);History of falling (Z91.81);Difficulty in walking, not elsewhere classified (R26.2)    Time: 4782-9562 PT Time Calculation (min) (ACUTE ONLY): 37 min   Charges:   PT Evaluation $PT Eval Moderate Complexity: 1 Mod     PT G CodesRayetta Humphrey, PT CLT [phone] 09/20/2017, 4:42 PM

## 2017-09-20 NOTE — Progress Notes (Addendum)
Subjective: Interval History: has no complaint of nausea or vomiting.  Patient also denies any difficulty in breathing..  Objective: Vital signs in last 24 hours: Temp:  [97.2 F (36.2 C)-98.4 F (36.9 C)] 98.4 F (36.9 C) (07/01 0616) Pulse Rate:  [68-96] 96 (07/01 0616) Resp:  [11-21] 18 (07/01 0616) BP: (77-173)/(48-71) 173/67 (07/01 0616) SpO2:  [95 %-99 %] 99 % (07/01 0616) Weight:  [83.9 kg (184 lb 15.5 oz)-91.2 kg (201 lb)] 83.9 kg (184 lb 15.5 oz) (07/01 0616) Weight change:   Intake/Output from previous day: 06/30 0701 - 07/01 0700 In: 1314.6 [I.V.:1314.6] Out: -  Intake/Output this shift: No intake/output data recorded.  General appearance: alert, cooperative and no distress Resp: clear to auscultation bilaterally Cardio: regular rate and rhythm Extremities: No edema  Lab Results: Recent Labs    09/19/17 1144 09/20/17 0450  WBC 8.8 9.8  HGB 9.4* 9.3*  HCT 30.9* 30.6*  PLT 499* 476*   BMET:  Recent Labs    09/19/17 1144 09/20/17 0450  NA 139 142  K 4.2 3.9  CL 101 104  CO2 22 23  GLUCOSE 322* 210*  BUN 116* 116*  CREATININE 7.02* 6.45*  CALCIUM 9.9 9.8   No results for input(s): PTH in the last 72 hours. Iron Studies: No results for input(s): IRON, TIBC, TRANSFERRIN, FERRITIN in the last 72 hours.  Studies/Results: Dg Chest 1 View  Result Date: 09/19/2017 CLINICAL DATA:  Recent syncopal episode EXAM: CHEST  1 VIEW COMPARISON:  09/16/2017 FINDINGS: Cardiac shadow is stable. The lungs are well aerated bilaterally. No focal infiltrate or sizable effusion is seen. No acute bony abnormality is noted. IMPRESSION: No active disease. Electronically Signed   By: Inez Catalina M.D.   On: 09/19/2017 15:33   Ct Head Wo Contrast  Result Date: 09/19/2017 CLINICAL DATA:  Syncopal episode during bowel movement EXAM: CT HEAD WITHOUT CONTRAST TECHNIQUE: Contiguous axial images were obtained from the base of the skull through the vertex without intravenous contrast.  COMPARISON:  05/28/2011 FINDINGS: Brain: No evidence of acute infarction, hemorrhage, hydrocephalus, extra-axial collection or mass lesion/mass effect. Vascular: No hyperdense vessel or unexpected calcification. Skull: Normal. Negative for fracture or focal lesion. Sinuses/Orbits: No acute finding. Other: None. IMPRESSION: Normal head CT for age Electronically Signed   By: Inez Catalina M.D.   On: 09/19/2017 15:31    I have reviewed the patient's current medications.  Assessment/Plan: Acute kidney injury superimposed on chronic.  Her BUN is 110 remains high but her creatinine is coming down to 6.45.  Presently patient does not have any uremic signs and symptoms.  Potassium is normal. 2] chronic renal failure: Stage V.  Possibly secondary to diabetes. 3] hypertension: Her blood pressure seems to be increasing.  Her antihypertensive medications were discontinued because of hypotension. 4] anemia: Her hemoglobin is below our target goal. 5] bone and mineral disorder: Her calcium is range but phosphorus is high. 6] history of CHF: Patient is he denies any difficulty breathing.  Patient states that she is making urine but his output is not documented.  Plan: 1] we will start her on Lasix 80 mg IV twice daily 2] we will put her back on labetalol 200 mg p.o. twice daily 3] we will start her Renvela 800 mg 2 tablet p.o. 3 times daily with meals one with snack 4] we will check her renal panel and CBC in the morning.  LOS: 1 day   Kaylenn Civil S 09/20/2017,9:01 AM

## 2017-09-20 NOTE — Clinical Social Work Note (Signed)
Patient is currently at [facility] for short term rehab.  She has been there for a couple of weeks.  Patient is totally independent at baseline. Patient can return to the facility at discharge and will not need a new FL2.     Kathryn Beck, Clydene Pugh, LCSW

## 2017-09-20 NOTE — Progress Notes (Signed)
PROGRESS NOTE    Kathryn Beck  XFG:[id number] DOB: 1956-02-01 DOA: 09/19/2017 PCP: Fayrene Helper, MD     Brief Narrative:  62 year old woman admitted from skilled nursing facility on 6/30 with complaints of syncope and hypotension.  She has a history significant for diastolic heart failure, obstructive sleep apnea, stage IV-V kidney disease and insulin-dependent diabetes who has had at least 2 syncopal episodes in the past 3 days.  Patient had a recent hospital admission for diastolic heart failure exacerbation, underwent IV diuresis and was on maintenance torsemide and then discharged to the [facility] skilled nursing facility due to overall weakness and deconditioning.  She was found to be significantly hypotensive and orthostatic and admission was requested.   Assessment & Plan:   Principal Problem:   Orthostatic hypotension Active Problems:   Syncope   Uncontrolled diabetes mellitus with diabetic nephropathy, with long-term current use of insulin (HCC)   HLD (hyperlipidemia)   Malignant hypertension   CKD (chronic kidney disease) stage 5, GFR less than 15 ml/min (HCC)   GERD (gastroesophageal reflux disease)   AKI (acute kidney injury) (HCC)   Chronic diastolic CHF (congestive heart failure) (HCC)   Syncope -Suspect due to orthostasis likely due to overdiuresis given her recent admission for diastolic heart failure. -Holding blood pressure medication for now. -Nephrology has started IV Lasix today, monitor closely with orthostasis, may need to back off if no improvement in orthostasis.  Acute on chronic kidney disease stage V -Nephrology is on board, creatinine is improved today as compared to yesterday. -Patient does not have any uremic signs, potassium is normal, no acute indication for hemodialysis. -She has a left fistula that was done on 6/13, however it is still immature and not ready to use for dialysis.  Insulin-dependent diabetes -CBGs remain  elevated. -We will increase Levemir from 40 to 45 units, continue to monitor and adjust as needed.  Hypertension -Labetalol, Imdur, clonidine are on hold. -Continue to follow, if blood pressure stable may consider  reinitiation of some of her blood pressure medications over the next 24 hours.  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  Hypocalcemia -Secondary to chronic kidney disease -On calcitriol.  Hyperlipidemia -Continue atorvastatin and Zetia.  Weakness and deconditioning -Should return back to skilled nursing once medically stable.   DVT prophylaxis: Lovenox   Code Status: Full code Family Communication: Patient and son at bedside updated on plan of care and all questions answered Disposition Plan: Back to SNF once medically stable  Consultants:   Nephrology  Procedures:   None  Antimicrobials:  Anti-infectives (From admission, onward)   None       Subjective: Lying in bed, no complaints, denies chest pain or shortness of breath.  Objective: Vitals:   09/19/17 2042 09/19/17 2327 09/20/17 0616 09/20/17 1320  BP: (!) 152/63  (!) 173/67 (!) 158/70  Pulse: 78 83 96 100  Resp: 15 16 18 18   Temp: 98.2 F (36.8 C)  98.4 F (36.9 C) 98.7 F (37.1 C)  TempSrc: Oral  Oral Oral  SpO2: 99% 98% 99% 98%  Weight:   83.9 kg (184 lb 15.5 oz)   Height:        Intake/Output Summary (Last 24 hours) at 09/20/2017 1617 Last data filed at 09/20/2017 1400 Gross per 24 hour  Intake 2844.58 ml  Output -  Net 2844.58 ml   Filed Weights   09/19/17 1138 09/20/17 0616  Weight: 91.2 kg (201 lb) 83.9 kg (184 lb 15.5 oz)  Examination:  General exam: Alert, awake, oriented x 3, morbidly obese Respiratory system: Clear to auscultation. Respiratory effort normal. Cardiovascular system:RRR. No murmurs, rubs, gallops. Gastrointestinal system: Abdomen is nondistended, soft and nontender. No organomegaly or masses felt. Normal bowel sounds heard. Central  nervous system: Alert and oriented. No focal neurological deficits. Extremities: No C/C/E, +pedal pulses Skin: No rashes, lesions or ulcers Psychiatry: Judgement and insight appear normal. Mood & affect appropriate.     Data Reviewed: I have personally reviewed following labs and imaging studies  CBC: Recent Labs  Lab 09/16/17 1503 09/19/17 1144 09/20/17 0450  WBC 10.6* 8.8 9.8  NEUTROABS 6.5  --   --   HGB 9.3* 9.4* 9.3*  HCT 30.6* 30.9* 30.6*  MCV 75.9* 75.0* 75.6*  PLT 555* 499* 001*   Basic Metabolic Panel: Recent Labs  Lab 09/16/17 1503 09/17/17 0733 09/19/17 1144 09/20/17 0450  NA 141 141 139 142  K 4.6 4.6 4.2 3.9  CL 99 102 101 104  CO2 26 24 22 23   GLUCOSE 177* 275* 322* 210*  BUN 57* 118* 116* 116*  CREATININE 7.03* 7.07* 7.02* 6.45*  CALCIUM 10.0 9.7 9.9 9.8  MG  --   --   --  1.8  PHOS  --  8.1*  --  6.3*   GFR: Estimated Creatinine Clearance: 9.6 mL/min (A) (by C-G formula based on SCr of 6.45 mg/dL (H)). Liver Function Tests: Recent Labs  Lab 09/17/17 0733 09/20/17 0450  ALBUMIN 2.9* 2.9*   No results for input(s): LIPASE, AMYLASE in the last 168 hours. No results for input(s): AMMONIA in the last 168 hours. Coagulation Profile: No results for input(s): INR, PROTIME in the last 168 hours. Cardiac Enzymes: Recent Labs  Lab 09/16/17 1503 09/19/17 1438  TROPONINI <0.03 <0.03   BNP (last 3 results) No results for input(s): PROBNP in the last 8760 hours. HbA1C: No results for input(s): HGBA1C in the last 72 hours. CBG: Recent Labs  Lab 09/19/17 1137 09/19/17 1851 09/19/17 2045 09/20/17 0731 09/20/17 1106  GLUCAP 282* 327* 284* 215* 313*   Lipid Profile: No results for input(s): CHOL, HDL, LDLCALC, TRIG, CHOLHDL, LDLDIRECT in the last 72 hours. Thyroid Function Tests: No results for input(s): TSH, T4TOTAL, FREET4, T3FREE, THYROIDAB in the last 72 hours. Anemia Panel: No results for input(s): VITAMINB12, FOLATE, FERRITIN, TIBC,  IRON, RETICCTPCT in the last 72 hours. Urine analysis:    Component Value Date/Time   COLORURINE YELLOW 09/19/2017 1144   APPEARANCEUR HAZY (A) 09/19/2017 1144   LABSPEC 1.015 09/19/2017 1144   PHURINE 5.0 09/19/2017 1144   GLUCOSEU 150 (A) 09/19/2017 1144   HGBUR SMALL (A) 09/19/2017 1144   BILIRUBINUR NEGATIVE 09/19/2017 1144   BILIRUBINUR neg 02/15/2017 0913   KETONESUR NEGATIVE 09/19/2017 1144   PROTEINUR >=300 (A) 09/19/2017 1144   UROBILINOGEN 0.2 02/15/2017 0913   NITRITE NEGATIVE 09/19/2017 1144   LEUKOCYTESUR NEGATIVE 09/19/2017 1144   Sepsis Labs: @LABRCNTIP (procalcitonin:4,lacticidven:4)  ) Recent Results (from the past 240 hour(s))  Urine culture     Status: Abnormal   Collection Time: 09/19/17  2:35 PM  Result Value Ref Range Status   Specimen Description   Final    URINE, CLEAN CATCH Performed at [facility], [address], [place], [place] [address]    Special Requests   Final    NONE Performed at [facility], [address] [place], [place] [address]    Culture (A)  Final    <10,000 COLONIES/mL INSIGNIFICANT GROWTH Performed at [facility], [place] [address], [place], [place] [address]    Report Status 09/20/2017 FINAL  Final         Radiology Studies: Dg Chest 1 View  Result Date: 09/19/2017 CLINICAL DATA:  Recent syncopal episode EXAM: CHEST  1 VIEW COMPARISON:  09/16/2017 FINDINGS: Cardiac shadow is stable. The lungs are well aerated bilaterally. No focal infiltrate or sizable effusion is seen. No acute bony abnormality is noted. IMPRESSION: No active disease. Electronically Signed   By: Inez Catalina M.D.   On: 09/19/2017 15:33   Ct Head Wo Contrast  Result Date: 09/19/2017 CLINICAL DATA:  Syncopal episode during bowel movement EXAM: CT HEAD WITHOUT CONTRAST TECHNIQUE: Contiguous axial images were obtained from the base of the skull through the vertex without intravenous contrast. COMPARISON:  05/28/2011 FINDINGS: Brain: No evidence of  acute infarction, hemorrhage, hydrocephalus, extra-axial collection or mass lesion/mass effect. Vascular: No hyperdense vessel or unexpected calcification. Skull: Normal. Negative for fracture or focal lesion. Sinuses/Orbits: No acute finding. Other: None. IMPRESSION: Normal head CT for age Electronically Signed   By: Inez Catalina M.D.   On: 09/19/2017 15:31        Scheduled Meds: . aspirin EC  81 mg Oral Daily  . atorvastatin  20 mg Oral q1800  . azelastine  2 spray Each Nare BID  . calcitRIOL  0.25 mcg Oral Q M,W,F  . enoxaparin (LOVENOX) injection  30 mg Subcutaneous Q24H  . ezetimibe  10 mg Oral Daily  . furosemide  80 mg Intravenous BID  . gabapentin  100 mg Oral QHS  . insulin aspart  0-9 Units Subcutaneous TID WC  . insulin detemir  40 Units Subcutaneous BID  . labetalol  200 mg Oral BID  . sertraline  25 mg Oral Daily  . sevelamer carbonate  1,600 mg Oral TID WC  . sevelamer carbonate  800 mg Oral With snacks  . [START ON 09/22/2017] Vitamin D (Ergocalciferol)  50,000 Units Oral Q7 days   Continuous Infusions: . sodium chloride 125 mL/hr at 09/20/17 1223     LOS: 1 day    Time spent: 35 minutes. Greater than 50% of this time was spent in direct contact with the patient and with patient's son, coordinating care and discussing relevant ongoing clinical issues, including acute on chronic kidney disease, management of fluid status.     Lelon Frohlich, MD [facility] Pager [phone]  If 7PM-7AM, please contact night-coverage [email or web address] Password [facility] 09/20/2017, 4:17 PM

## 2017-09-21 LAB — CBC
HCT: 30.1 % — ABNORMAL LOW (ref 36.0–46.0)
HEMOGLOBIN: 9 g/dL — AB (ref 12.0–15.0)
MCH: 22.4 pg — AB (ref 26.0–34.0)
MCHC: 29.9 g/dL — AB (ref 30.0–36.0)
MCV: 75.1 fL — AB (ref 78.0–100.0)
PLATELETS: 459 10*3/uL — AB (ref 150–400)
RBC: 4.01 MIL/uL (ref 3.87–5.11)
RDW: 16.9 % — AB (ref 11.5–15.5)
WBC: 10.8 10*3/uL — ABNORMAL HIGH (ref 4.0–10.5)

## 2017-09-21 LAB — GLUCOSE, CAPILLARY
GLUCOSE-CAPILLARY: 218 mg/dL — AB (ref 70–99)
Glucose-Capillary: 235 mg/dL — ABNORMAL HIGH (ref 70–99)
Glucose-Capillary: 234 mg/dL — ABNORMAL HIGH (ref 70–99)
Glucose-Capillary: 180 mg/dL — ABNORMAL HIGH (ref 70–99)

## 2017-09-21 LAB — RENAL FUNCTION PANEL
Albumin: 2.7 g/dL — ABNORMAL LOW (ref 3.5–5.0)
Anion gap: 14 (ref 5–15)
BUN: 95 mg/dL — ABNORMAL HIGH (ref 8–23)
CHLORIDE: 106 mmol/L (ref 98–111)
CO2: 23 mmol/L (ref 22–32)
Calcium: 9.7 mg/dL (ref 8.9–10.3)
Creatinine, Ser: 4.99 mg/dL — ABNORMAL HIGH (ref 0.44–1.00)
GFR calc non Af Amer: 9 mL/min — ABNORMAL LOW (ref >60)
GFR, EST AFRICAN AMERICAN: 10 mL/min — AB (ref >60)
Glucose, Bld: 257 mg/dL — ABNORMAL HIGH (ref 70–99)
POTASSIUM: 3.7 mmol/L (ref 3.5–5.1)
Phosphorus: 4.6 mg/dL (ref 2.5–4.6)
Sodium: 143 mmol/L (ref 135–145)

## 2017-09-21 LAB — MAGNESIUM: Magnesium: 1.6 mg/dL — ABNORMAL LOW (ref 1.7–2.4)

## 2017-09-21 MED ORDER — INSULIN DETEMIR 100 UNIT/ML ~~LOC~~ SOLN
48.0000 [IU] | Freq: Two times a day (BID) | SUBCUTANEOUS | Status: DC
  Administered 2017-09-21 – 2017-09-23 (×4): 48 [IU] via SUBCUTANEOUS
  Filled 2017-09-21 (×6): qty 0.48

## 2017-09-21 MED ORDER — SODIUM CHLORIDE 0.9 % IV SOLN
INTRAVENOUS | Status: DC
  Administered 2017-09-21 – 2017-09-22 (×2): via INTRAVENOUS

## 2017-09-21 MED ORDER — TORSEMIDE 20 MG PO TABS
40.0000 mg | ORAL_TABLET | Freq: Every day | ORAL | Status: DC
  Administered 2017-09-21: 40 mg via ORAL
  Filled 2017-09-21: qty 2

## 2017-09-21 MED ORDER — SEVELAMER CARBONATE 800 MG PO TABS
800.0000 mg | ORAL_TABLET | Freq: Three times a day (TID) | ORAL | Status: DC
  Administered 2017-09-21 – 2017-09-23 (×7): 800 mg via ORAL
  Filled 2017-09-21 (×8): qty 1

## 2017-09-21 MED ORDER — ONDANSETRON HCL 4 MG/2ML IJ SOLN
4.0000 mg | Freq: Four times a day (QID) | INTRAMUSCULAR | Status: DC | PRN
  Administered 2017-09-21 – 2017-09-22 (×3): 4 mg via INTRAVENOUS
  Filled 2017-09-21 (×3): qty 2

## 2017-09-21 NOTE — Progress Notes (Signed)
Subjective: Interval History: Patient is feeling much better.  She had episode of nausea and vomiting this morning..  Denies also any difficulty breathing.  Objective: Vital signs in last 24 hours: Temp:  [98.7 F (37.1 C)-99.7 F (37.6 C)] 99.3 F (37.4 C) (07/01 2130) Pulse Rate:  [89-100] 89 (07/01 2130) Resp:  [17-24] 17 (07/01 2130) BP: (142-178)/(67-78) 178/73 (07/01 2130) SpO2:  [94 %-99 %] 97 % (07/01 2130) Weight:  [82.7 kg (182 lb 5.1 oz)] 82.7 kg (182 lb 5.1 oz) (07/02 0500) Weight change: -8.473 kg (-18 lb 10.9 oz)  Intake/Output from previous day: 07/01 0701 - 07/02 0700 In: 1770 [P.O.:720; I.V.:1050] Out: 500 [Urine:500] Intake/Output this shift: No intake/output data recorded.  General appearance: alert, cooperative and no distress Resp: clear to auscultation bilaterally Cardio: regular rate and rhythm Extremities: No edema  Lab Results: Recent Labs    09/20/17 0450 09/21/17 0451  WBC 9.8 10.8*  HGB 9.3* 9.0*  HCT 30.6* 30.1*  PLT 476* 459*   BMET:  Recent Labs    09/20/17 0450 09/21/17 0451  NA 142 143  K 3.9 3.7  CL 104 106  CO2 23 23  GLUCOSE 210* 257*  BUN 116* 95*  CREATININE 6.45* 4.99*  CALCIUM 9.8 9.7   No results for input(s): PTH in the last 72 hours. Iron Studies: No results for input(s): IRON, TIBC, TRANSFERRIN, FERRITIN in the last 72 hours.  Studies/Results: Dg Chest 1 View  Result Date: 09/19/2017 CLINICAL DATA:  Recent syncopal episode EXAM: CHEST  1 VIEW COMPARISON:  09/16/2017 FINDINGS: Cardiac shadow is stable. The lungs are well aerated bilaterally. No focal infiltrate or sizable effusion is seen. No acute bony abnormality is noted. IMPRESSION: No active disease. Electronically Signed   By: Inez Catalina M.D.   On: 09/19/2017 15:33   Ct Head Wo Contrast  Result Date: 09/19/2017 CLINICAL DATA:  Syncopal episode during bowel movement EXAM: CT HEAD WITHOUT CONTRAST TECHNIQUE: Contiguous axial images were obtained from the  base of the skull through the vertex without intravenous contrast. COMPARISON:  05/28/2011 FINDINGS: Brain: No evidence of acute infarction, hemorrhage, hydrocephalus, extra-axial collection or mass lesion/mass effect. Vascular: No hyperdense vessel or unexpected calcification. Skull: Normal. Negative for fracture or focal lesion. Sinuses/Orbits: No acute finding. Other: None. IMPRESSION: Normal head CT for age Electronically Signed   By: Inez Catalina M.D.   On: 09/19/2017 15:31    I have reviewed the patient's current medications.  Assessment/Plan: Acute kidney injury superimposed on chronic.  Her renal function continue to improve.  Her BUN is 95 and creatinine is 4.99.  Probably her baseline. 2] chronic renal failure: Stage V.  Possibly secondary to diabetes. 3] hypertension: Her blood pressure is high.  She was started back on labetalol. 4] anemia: Her hemoglobin is below our target goal. 5] bone and mineral disorder: Her calcium and phosphorus is range.  Patient is started on binder. 6] history of CHF: Patient is he denies any difficulty breathing.  She is on Lasix.  She is nonoliguric. Plan: 1] we will decrease IV fluid to 50 cc/h and DC Lasix 2] we will decrease Renvela to 800 mg 1 tablet p.o. 3 times daily with meals 4] we will check her renal panel and CBC in the morning. 5] at this point patient does require dialysis and could be referred back to be followed as an outpatient by her primary nephrologist. 6] we will start her on Demadex 40 mg p.o. daily  LOS: 2 days  Oslo Huntsman S 09/21/2017,7:25 AM

## 2017-09-21 NOTE — Progress Notes (Signed)
Physical Therapy Treatment Patient Details Name: Kathryn Beck MRN: [id number] DOB: 08/13/1955 Today's Date: 09/21/2017    History of Present Illness Kathryn Beck is a 62 y.o. female with hx of HFpEF, OSA (not on CPAP), CKD stage 4-5 (new AV fistula in LUE placed ~ 2 weeks ago), IDDM who presented with recurrent syncope x 2 episodes in the past 3 days. Patient had a recent hospital admission for (HFpEF) CHF exacerbation and underwent IV diuresis and was on maintenance torsemide 100mg  daily, then discharged to [facility] SNF due to overall weakness and deconditioning. The first episode was while she was taking a bath (witnessed, while in the bath tub); this time, she was on the toilet when she felt acutely dizzy then lost consciousness. Regained consciousness within 2 minutes. Initial concern for seizure due to patient's "eyes rolling back" during the event. However, she reportedly was responsive within 5 minutes of the event. No incontinence or tongue biting reported. Her BP was notably 70s/50s.  Still makes urine although reports UOP has decreased over the past week. Also notes reduced appetite. Denies fever, cough or dysuria. No GI sx. No chest pain or palpitations. She reports that over the past few weeks she has had near-syncope and dizziness with postural changes ie lying to sitting    PT Comments    Patient received supine in bed and denied pain at start of session, agreeable to participating in physical therapy session. Patient continues to require Mod assist for sit to stand transfers and verbal/tacile cuing for safe sequencing and positioning. She initiated gait training today and was able to ambulate with RW to bathroom. Verbal cues for sequencing step pattern and assistance required for management of RW. Patient requested to remain on toilet for a little while to use the bathroom as she reported feeling constipated. I instructed her to use the pull/call bell on the bathroom wall when she  was ready to get up and move to her bedside recliner. I notified her RN, Lonn Georgia, who was outside of the patients room and available to assist her when patient completed toileting.  Acute PT will follow throughout hospital stay to progress functional gait and mobility.    Follow Up Recommendations  SNF     Equipment Recommendations  None recommended by PT    Recommendations for Other Services OT consult     Precautions / Restrictions Precautions Precautions: Fall Restrictions Weight Bearing Restrictions: No    Mobility  Bed Mobility Overal bed mobility: Needs Assistance Bed Mobility: Supine to Sit     Transfers Overall transfer level: Needs assistance Equipment used: Rolling walker (2 wheeled) Transfers: Sit to/from Stand Sit to Stand: Mod assist;From elevated surface      General transfer comment: bed at elevated postition  Ambulation/Gait Ambulation/Gait assistance: Min assist Gait Distance (Feet): 12 Feet Assistive device: Rolling walker (2 wheeled) Gait Pattern/deviations: Step-to pattern;Decreased step length - right;Decreased step length - left;Decreased stride length;Trunk flexed;Wide base of support Gait velocity: slow and labored Gait velocity interpretation: <1.31 ft/sec, indicative of household ambulator General Gait Details: Min assist with cues and manual assistance to manage RW, and verbal cues for sequencin step pattern, patient ambulated to bathroom       Balance Overall balance assessment: Needs assistance Sitting-balance support: No upper extremity supported;Feet supported Sitting balance-Leahy Scale: Good     Standing balance support: Bilateral upper extremity supported Standing balance-Leahy Scale: Poor Standing balance comment: Reliant on UE support     Cognition Arousal/Alertness: Awake/alert Behavior During  Therapy: WFL for tasks assessed/performed Overall Cognitive Status: Within Functional Limits for tasks assessed                Pertinent Vitals/Pain Pain Assessment: Faces Faces Pain Scale: Hurts even more Pain Location: B LE  Pain Descriptors / Indicators: Aching Pain Intervention(s): Limited activity within patient's tolerance;Monitored during session           PT Goals (current goals can now be found in the care plan section) Acute Rehab PT Goals Patient Stated Goal: Get stronger with rehab before returning home; less BLE pain PT Goal Formulation: With patient Time For Goal Achievement: 09/14/17 Potential to Achieve Goals: Good Progress towards PT goals: Progressing toward goals    Frequency    Min 5X/week      PT Plan Current plan remains appropriate       AM-PAC PT "6 Clicks" Daily Activity  Outcome Measure  Difficulty turning over in bed (including adjusting bedclothes, sheets and blankets)?: A Little Difficulty moving from lying on back to sitting on the side of the bed? : A Little Difficulty sitting down on and standing up from a chair with arms (e.g., wheelchair, bedside commode, etc,.)?: Unable Help needed moving to and from a bed to chair (including a wheelchair)?: A Little Help needed walking in hospital room?: A Little Help needed climbing 3-5 steps with a railing? : A Lot 6 Click Score: 15    End of Session Equipment Utilized During Treatment: Gait belt Activity Tolerance: Patient tolerated treatment well Patient left: with call bell/phone within reach;Other (comment)(patient on toilet in bathroom, RN notifeid and outside of room ready to assist patinet when finished) Nurse Communication: Mobility status PT Visit Diagnosis: Unsteadiness on feet (R26.81);Repeated falls (R29.6);Muscle weakness (generalized) (M62.81);History of falling (Z91.81);Difficulty in walking, not elsewhere classified (R26.2) Pain - Right/Left: (Bil LE) Pain - part of body: Leg     Time: 4854-6270 PT Time Calculation (min) (ACUTE ONLY): 26 min  Charges:  $Gait Training: 8-22 mins $Therapeutic  Activity: 8-22 mins                    G Codes:       Kipp Brood, PT, DPT Physical Therapist with [facility]  09/21/2017 11:56 AM

## 2017-09-21 NOTE — Progress Notes (Signed)
OT Cancellation Note  Patient Details Name: Kathryn Beck MRN: [id number] DOB: 1955-12-28   Cancelled Treatment:    Reason Eval/Treat Not Completed: Patient at procedure or test/ unavailable. Attempted evaluation x2 this am, pt with MD on first attempt, working with nsg staff on second attempt. Will check back on pt at a later time today or tomorrow morning.   Guadelupe Sabin, OTR/L  [phone] 09/21/2017, 9:09 AM

## 2017-09-21 NOTE — Progress Notes (Signed)
PROGRESS NOTE    Kathryn Beck  ZOX:[id number] DOB: 1955/04/17 DOA: 09/19/2017 PCP: Fayrene Helper, MD     Brief Narrative:  62 year old woman admitted from skilled nursing facility on 6/30 with complaints of syncope and hypotension.  She has a history significant for diastolic heart failure, obstructive sleep apnea, stage IV-V kidney disease and insulin-dependent diabetes who has had at least 2 syncopal episodes in the past 3 days.  Patient had a recent hospital admission for diastolic heart failure exacerbation, underwent IV diuresis and was on maintenance torsemide and then discharged to the [facility] skilled nursing facility due to overall weakness and deconditioning.  She was found to be significantly hypotensive and orthostatic and admission was requested.   Assessment & Plan:   Principal Problem:   Orthostatic hypotension Active Problems:   Syncope   Uncontrolled diabetes mellitus with diabetic nephropathy, with long-term current use of insulin (HCC)   HLD (hyperlipidemia)   Malignant hypertension   CKD (chronic kidney disease) stage 5, GFR less than 15 ml/min (HCC)   GERD (gastroesophageal reflux disease)   AKI (acute kidney injury) (HCC)   Chronic diastolic CHF (congestive heart failure) (HCC)   Syncope -Suspect due to orthostasis likely due to overdiuresis given her recent admission for diastolic heart failure. -Holding blood pressure medication for now. -Nephrology has discontinued lasix and started torsemide, unfortunately she continues to be symptomatic and dizzy upon standing (still orthostatic as well). Will hold demadex today.  Acute on chronic kidney disease stage V -Nephrology is on board, creatinine is improved today as compared to yesterday. -Patient does not have any uremic signs, potassium is normal, no acute indication for hemodialysis. -She has a left fistula that was done on 6/13, however it is still immature and not ready to use for  dialysis.  Insulin-dependent diabetes -CBGs remain elevated but improved with increased lantus dosing. -Will further increase Levemir from 45 to 48 units, continue to monitor and adjust as needed.  Hypertension -Labetalol, Imdur, clonidine are on hold. -Continue to follow, if blood pressure stable may consider  reinitiation of some of her blood pressure medications over the next 24 hours.  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  Hypocalcemia -Secondary to chronic kidney disease -On calcitriol.  Hyperlipidemia -Continue atorvastatin and Zetia.  Weakness and deconditioning -Should return back to skilled nursing once medically stable.   DVT prophylaxis: Lovenox   Code Status: Full code Family Communication: Patient only Disposition Plan: Back to SNF once medically stable, anticipate 48-72 hours.  Consultants:   Nephrology  Procedures:   None  Antimicrobials:  Anti-infectives (From admission, onward)   None       Subjective: In bed, nauseous today.  Objective: Vitals:   09/20/17 2130 09/21/17 0500 09/21/17 1254 09/21/17 1322  BP: (!) 178/73  (!) 162/72   Pulse: 89  88   Resp: 17  18   Temp: 99.3 F (37.4 C)  97.9 F (36.6 C)   TempSrc: Oral  Oral   SpO2: 97%  95% 96%  Weight:  82.7 kg (182 lb 5.1 oz)    Height:        Intake/Output Summary (Last 24 hours) at 09/21/2017 1552 Last data filed at 09/21/2017 1534 Gross per 24 hour  Intake 815.83 ml  Output 1400 ml  Net -584.17 ml   Filed Weights   09/19/17 1138 09/20/17 0616 09/21/17 0500  Weight: 91.2 kg (201 lb) 83.9 kg (184 lb 15.5 oz) 82.7 kg (182 lb 5.1 oz)  Examination:  General exam: Alert, awake, oriented x 3 Respiratory system: Clear to auscultation. Respiratory effort normal. Cardiovascular system:RRR. No murmurs, rubs, gallops. Gastrointestinal system: Abdomen is nondistended, soft and nontender. No organomegaly or masses felt. Normal bowel sounds heard. Central nervous  system: Alert and oriented. No focal neurological deficits. Extremities: No C/C/E, +pedal pulses Skin: No rashes, lesions or ulcers Psychiatry: Judgement and insight appear normal. Mood & affect appropriate.      Data Reviewed: I have personally reviewed following labs and imaging studies  CBC: Recent Labs  Lab 09/16/17 1503 09/19/17 1144 09/20/17 0450 09/21/17 0451  WBC 10.6* 8.8 9.8 10.8*  NEUTROABS 6.5  --   --   --   HGB 9.3* 9.4* 9.3* 9.0*  HCT 30.6* 30.9* 30.6* 30.1*  MCV 75.9* 75.0* 75.6* 75.1*  PLT 555* 499* 476* 093*   Basic Metabolic Panel: Recent Labs  Lab 09/16/17 1503 09/17/17 0733 09/19/17 1144 09/20/17 0450 09/21/17 0451  NA 141 141 139 142 143  K 4.6 4.6 4.2 3.9 3.7  CL 99 102 101 104 106  CO2 26 24 22 23 23   GLUCOSE 177* 275* 322* 210* 257*  BUN 57* 118* 116* 116* 95*  CREATININE 7.03* 7.07* 7.02* 6.45* 4.99*  CALCIUM 10.0 9.7 9.9 9.8 9.7  MG  --   --   --  1.8 1.6*  PHOS  --  8.1*  --  6.3* 4.6   GFR: Estimated Creatinine Clearance: 12.3 mL/min (A) (by C-G formula based on SCr of 4.99 mg/dL (H)). Liver Function Tests: Recent Labs  Lab 09/17/17 0733 09/20/17 0450 09/21/17 0451  ALBUMIN 2.9* 2.9* 2.7*   No results for input(s): LIPASE, AMYLASE in the last 168 hours. No results for input(s): AMMONIA in the last 168 hours. Coagulation Profile: No results for input(s): INR, PROTIME in the last 168 hours. Cardiac Enzymes: Recent Labs  Lab 09/16/17 1503 09/19/17 1438  TROPONINI <0.03 <0.03   BNP (last 3 results) No results for input(s): PROBNP in the last 8760 hours. HbA1C: No results for input(s): HGBA1C in the last 72 hours. CBG: Recent Labs  Lab 09/20/17 1106 09/20/17 1630 09/20/17 2129 09/21/17 0758 09/21/17 1109  GLUCAP 313* 322* 264* 235* 234*   Lipid Profile: No results for input(s): CHOL, HDL, LDLCALC, TRIG, CHOLHDL, LDLDIRECT in the last 72 hours. Thyroid Function Tests: No results for input(s): TSH, T4TOTAL, FREET4,  T3FREE, THYROIDAB in the last 72 hours. Anemia Panel: No results for input(s): VITAMINB12, FOLATE, FERRITIN, TIBC, IRON, RETICCTPCT in the last 72 hours. Urine analysis:    Component Value Date/Time   COLORURINE YELLOW 09/19/2017 1144   APPEARANCEUR HAZY (A) 09/19/2017 1144   LABSPEC 1.015 09/19/2017 1144   PHURINE 5.0 09/19/2017 1144   GLUCOSEU 150 (A) 09/19/2017 1144   HGBUR SMALL (A) 09/19/2017 1144   BILIRUBINUR NEGATIVE 09/19/2017 1144   BILIRUBINUR neg 02/15/2017 0913   KETONESUR NEGATIVE 09/19/2017 1144   PROTEINUR >=300 (A) 09/19/2017 1144   UROBILINOGEN 0.2 02/15/2017 0913   NITRITE NEGATIVE 09/19/2017 1144   LEUKOCYTESUR NEGATIVE 09/19/2017 1144   Sepsis Labs: @LABRCNTIP (procalcitonin:4,lacticidven:4)  ) Recent Results (from the past 240 hour(s))  Urine culture     Status: Abnormal   Collection Time: 09/19/17  2:35 PM  Result Value Ref Range Status   Specimen Description   Final    URINE, CLEAN CATCH Performed at [facility], [address] [place], [place] [address]    Special Requests   Final    NONE Performed at [facility]  [address] [place], [place] [address]    Culture (A)  Final    <10,000 COLONIES/mL INSIGNIFICANT GROWTH Performed at [facility] [address], [place], [place] [address]    Report Status 09/20/2017 FINAL  Final         Radiology Studies: No results found.      Scheduled Meds: . aspirin EC  81 mg Oral Daily  . atorvastatin  20 mg Oral q1800  . azelastine  2 spray Each Nare BID  . calcitRIOL  0.25 mcg Oral Q M,W,F  . enoxaparin (LOVENOX) injection  30 mg Subcutaneous Q24H  . ezetimibe  10 mg Oral Daily  . gabapentin  100 mg Oral QHS  . insulin aspart  0-9 Units Subcutaneous TID WC  . insulin detemir  45 Units Subcutaneous BID  . labetalol  200 mg Oral BID  . sertraline  25 mg Oral Daily  . sevelamer carbonate  800 mg Oral TID WC  . torsemide  40 mg Oral Daily  . [START ON 09/22/2017] Vitamin D  (Ergocalciferol)  50,000 Units Oral Q7 days   Continuous Infusions: . sodium chloride 50 mL/hr at 09/21/17 0851     LOS: 2 days    Time spent: 25 minutes.     Lelon Frohlich, MD [facility] Pager [phone]  If 7PM-7AM, please contact night-coverage [email or web address] Password [email or web address] 09/21/2017, 3:52 PM

## 2017-09-22 DIAGNOSIS — I951 Orthostatic hypotension: Principal | ICD-10-CM

## 2017-09-22 LAB — RENAL FUNCTION PANEL
Albumin: 2.6 g/dL — ABNORMAL LOW (ref 3.5–5.0)
Anion gap: 11 (ref 5–15)
BUN: 85 mg/dL — ABNORMAL HIGH (ref 8–23)
CHLORIDE: 111 mmol/L (ref 98–111)
CO2: 25 mmol/L (ref 22–32)
CREATININE: 4.3 mg/dL — AB (ref 0.44–1.00)
Calcium: 10.1 mg/dL (ref 8.9–10.3)
GFR calc Af Amer: 12 mL/min — ABNORMAL LOW (ref >60)
GFR calc non Af Amer: 10 mL/min — ABNORMAL LOW (ref >60)
GLUCOSE: 180 mg/dL — AB (ref 70–99)
Phosphorus: 3.7 mg/dL (ref 2.5–4.6)
Potassium: 4.1 mmol/L (ref 3.5–5.1)
Sodium: 147 mmol/L — ABNORMAL HIGH (ref 135–145)

## 2017-09-22 LAB — GLUCOSE, CAPILLARY
Glucose-Capillary: 163 mg/dL — ABNORMAL HIGH (ref 70–99)
Glucose-Capillary: 193 mg/dL — ABNORMAL HIGH (ref 70–99)
Glucose-Capillary: 136 mg/dL — ABNORMAL HIGH (ref 70–99)
Glucose-Capillary: 205 mg/dL — ABNORMAL HIGH (ref 70–99)

## 2017-09-22 LAB — MAGNESIUM: MAGNESIUM: 1.6 mg/dL — AB (ref 1.7–2.4)

## 2017-09-22 MED ORDER — LACTATED RINGERS IV BOLUS
500.0000 mL | Freq: Once | INTRAVENOUS | Status: AC
  Administered 2017-09-22: 500 mL via INTRAVENOUS

## 2017-09-22 MED ORDER — ISOSORBIDE MONONITRATE ER 60 MG PO TB24
30.0000 mg | ORAL_TABLET | Freq: Every day | ORAL | Status: DC
  Administered 2017-09-22: 30 mg via ORAL
  Filled 2017-09-22 (×3): qty 1

## 2017-09-22 MED ORDER — CLONIDINE HCL 0.1 MG PO TABS
0.1000 mg | ORAL_TABLET | Freq: Every day | ORAL | Status: DC
  Administered 2017-09-22 – 2017-09-23 (×2): 0.1 mg via ORAL
  Filled 2017-09-22 (×2): qty 1

## 2017-09-22 MED ORDER — LACTATED RINGERS IV BOLUS
250.0000 mL | Freq: Once | INTRAVENOUS | Status: AC
  Administered 2017-09-22: 250 mL via INTRAVENOUS

## 2017-09-22 NOTE — Progress Notes (Signed)
PT Cancellation Note  Patient Details Name: KAO BERKHEIMER MRN: [id number] DOB: 02-05-1956   Cancelled Treatment:    Reason Eval/Treat Not Completed: Other (comment)(Pt with several visiters and nursing staff in room, will try again later if available)  Ihor Austin, [place]; [place]  Aldona Lento 09/22/2017, 4:15 PM

## 2017-09-22 NOTE — Plan of Care (Signed)
Patient has had poor appetite today with nausea this shift. She was however able to tolerate a cup of chicken broth.

## 2017-09-22 NOTE — Progress Notes (Addendum)
PROGRESS NOTE    Kathryn Beck  SAY:[id number] DOB: 03/24/1955 DOA: 09/19/2017 PCP: Fayrene Helper, MD   Brief Narrative:   62 year old woman admitted from skilled nursing facility on 6/30 with complaints of syncope and hypotension.  She has a history significant for diastolic heart failure, obstructive sleep apnea, stage IV-V kidney disease and insulin-dependent diabetes who has had at least 2 syncopal episodes in the past 3 days.  Patient had a recent hospital admission for diastolic heart failure exacerbation, underwent IV diuresis and was on maintenance torsemide and then discharged to the [facility] skilled nursing facility due to overall weakness and deconditioning.  She was found to be significantly hypotensive and orthostatic and admission was requested.  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 Syncope likely secondary to orthostasis.  Patient continues to have orthostasis as demonstrated on orthostatic vitals today.  Will DC IV fluid as currently ordered due to some developing hypernatremia and hyperchloremia.  Will administer 250 cc boluses with recheck on orthostatics this afternoon to see if this starts to help.  Appreciate further nephrology recommendations regarding volume management. 2. AKI on CKD stage V.  Appreciate nephrology recommendations with likely plans for hemodialysis initiation in the outpatient setting. 3. IDDM.  Blood glucose demonstrates improvement with Levemir to 48 units yesterday.  Continue on current dose as she has poor  appetite. 4. Hypertension.  Initiate home medications of Imdur, and clonidine as blood pressure has now elevated.  Continue to hold labetalol due to orthostasis to assess heart rate response.  Heart rate was noted to be elevated at 116 bpm in the standing position with blood pressure dropping to 139/82 compared to 182/82 supine at 94 bpm. 5. Diabetic neuropathy.  Continue gabapentin. 6. Hypocalcemia secondary to chronic kidney disease.  Maintain on calcitriol. 7. Dyslipidemia.  Continue atorvastatin and Zetia. 8. Weakness and deconditioning.  Return to skilled nursing facility once medically stable.   DVT prophylaxis:Lovenox Code Status: Full Family Communication: Cousin at bedside Disposition Plan: SNF once stable, hopefully 24-48 hours   Consultants:   Nephrology  Procedures:   None  Antimicrobials:   None   Subjective: Patient seen and evaluated today with no new acute complaints or concerns. No acute concerns or events noted overnight.  She continues to feel weak and dizzy upon standing.  Objective: Vitals:   09/22/17 0902 09/22/17 0949 09/22/17 0953 09/22/17 1000  BP: (!) 179/74 (!) 182/82 (!) 172/77 139/82  Pulse: 88 94 99 (!) 116  Resp: 17 18    Temp: 97.7 F (36.5 C)     TempSrc: Oral     SpO2: 98% 97% 96% 95%  Weight:      Height:        Intake/Output Summary (Last 24 hours) at 09/22/2017 1305 Last data filed at 09/22/2017 0910 Gross per 24 hour  Intake 815.83 ml  Output 650 ml  Net 165.83 ml   Filed Weights   09/20/17 0616 09/21/17 0500 09/22/17 0452  Weight: 83.9 kg (184 lb 15.5 oz) 82.7 kg (182 lb 5.1 oz) 83.9 kg (184 lb 15.5 oz)  Examination:  General exam: Appears calm and comfortable  Respiratory system: Clear to auscultation. Respiratory effort normal. Cardiovascular system: S1 & S2 heard, RRR. No JVD, murmurs, rubs, gallops or clicks. No pedal edema. Gastrointestinal system: Abdomen is nondistended, soft and nontender. No organomegaly or  masses felt. Normal bowel sounds heard. Central nervous system: Alert and oriented. No focal neurological deficits. Extremities: Symmetric 5 x 5 power. Skin: No rashes, lesions or ulcers Psychiatry: Judgement and insight appear normal. Mood & affect appropriate.     Data Reviewed: I have personally reviewed following labs and imaging studies  CBC: Recent Labs  Lab 09/16/17 1503 09/19/17 1144 09/20/17 0450 09/21/17 0451  WBC 10.6* 8.8 9.8 10.8*  NEUTROABS 6.5  --   --   --   HGB 9.3* 9.4* 9.3* 9.0*  HCT 30.6* 30.9* 30.6* 30.1*  MCV 75.9* 75.0* 75.6* 75.1*  PLT 555* 499* 476* 408*   Basic Metabolic Panel: Recent Labs  Lab 09/17/17 0733 09/19/17 1144 09/20/17 0450 09/21/17 0451 09/22/17 0436  NA 141 139 142 143 147*  K 4.6 4.2 3.9 3.7 4.1  CL 102 101 104 106 111  CO2 24 22 23 23 25   GLUCOSE 275* 322* 210* 257* 180*  BUN 118* 116* 116* 95* 85*  CREATININE 7.07* 7.02* 6.45* 4.99* 4.30*  CALCIUM 9.7 9.9 9.8 9.7 10.1  MG  --   --  1.8 1.6* 1.6*  PHOS 8.1*  --  6.3* 4.6 3.7   GFR: Estimated Creatinine Clearance: 14.4 mL/min (A) (by C-G formula based on SCr of 4.3 mg/dL (H)). Liver Function Tests: Recent Labs  Lab 09/17/17 0733 09/20/17 0450 09/21/17 0451 09/22/17 0436  ALBUMIN 2.9* 2.9* 2.7* 2.6*   No results for input(s): LIPASE, AMYLASE in the last 168 hours. No results for input(s): AMMONIA in the last 168 hours. Coagulation Profile: No results for input(s): INR, PROTIME in the last 168 hours. Cardiac Enzymes: Recent Labs  Lab 09/16/17 1503 09/19/17 1438  TROPONINI <0.03 <0.03   BNP (last 3 results) No results for input(s): PROBNP in the last 8760 hours. HbA1C: No results for input(s): HGBA1C in the last 72 hours. CBG: Recent Labs  Lab 09/21/17 1109 09/21/17 1621 09/21/17 2104 09/22/17 0725 09/22/17 1119  GLUCAP 234* 218* 180* 163* 193*   Lipid Profile: No results for input(s): CHOL, HDL, LDLCALC, TRIG, CHOLHDL, LDLDIRECT in the last 72  hours. Thyroid Function Tests: No results for input(s): TSH, T4TOTAL, FREET4, T3FREE, THYROIDAB in the last 72 hours. Anemia Panel: No results for input(s): VITAMINB12, FOLATE, FERRITIN, TIBC, IRON, RETICCTPCT in the last 72 hours. Sepsis Labs: No results for input(s): PROCALCITON, LATICACIDVEN in the last 168 hours.  Recent Results (from the past 240 hour(s))  Urine culture     Status: Abnormal   Collection Time: 09/19/17  2:35 PM  Result Value Ref Range Status   Specimen Description   Final    URINE, CLEAN CATCH Performed at [facility], [address], [place], [place] [address]    Special Requests   Final    NONE Performed at [facility], [address], [place], [place] [address]    Culture (A)  Final    <10,000 COLONIES/mL INSIGNIFICANT GROWTH Performed at [facility] [address], [place], [place] [address]    Report Status 09/20/2017 FINAL  Final         Radiology Studies: No results found.      Scheduled Meds: . aspirin EC  81 mg Oral Daily  .  atorvastatin  20 mg Oral q1800  . azelastine  2 spray Each Nare BID  . calcitRIOL  0.25 mcg Oral Q M,W,F  . enoxaparin (LOVENOX) injection  30 mg Subcutaneous Q24H  . ezetimibe  10 mg Oral Daily  . gabapentin  100 mg Oral QHS  . insulin aspart  0-9 Units Subcutaneous TID WC  . insulin detemir  48 Units Subcutaneous BID  . labetalol  200 mg Oral BID  . sertraline  25 mg Oral Daily  . sevelamer carbonate  800 mg Oral TID WC  . Vitamin D (Ergocalciferol)  50,000 Units Oral Q7 days   Continuous Infusions: . lactated ringers 250 mL (09/22/17 1156)     LOS: 3 days    Time spent: 30 minutes    Pratik Darleen Crocker, DO [facility] Pager [phone]  If 7PM-7AM, please contact night-coverage [email or web address] Password [email or web address] 09/22/2017, 1:05 PM

## 2017-09-22 NOTE — Progress Notes (Signed)
Patient sleeping on right side. No distress noted. IV infusing. Family member at bedside. Call bell within reach, bed in low position. Will continue to monitor.  Harless Litten, RN 09/22/2017 3:58 AM

## 2017-09-22 NOTE — Progress Notes (Signed)
Kathryn Beck  MRN: [id number]  DOB/AGE: 1955-09-30 62 y.o.  Primary Care Physician:Simpson, Norwood Levo, MD  Admit date: 09/19/2017  Chief Complaint:  Chief Complaint  Patient presents with  . Loss of Consciousness    S-Pt presented on  09/19/2017 with  Chief Complaint  Patient presents with  . Loss of Consciousness  .    Pt today feels better.    Pt offers no new complaints.    Pt daughter and son are in the room.    Pt family asked  extensive questions about dialysis, need/ benefit. Different modalities of dialysis.   I answered the questions to best of my ability. I reviewed pt creat data going back to  2008.   Meds . aspirin EC  81 mg Oral Daily  . atorvastatin  20 mg Oral q1800  . azelastine  2 spray Each Nare BID  . calcitRIOL  0.25 mcg Oral Q M,W,F  . enoxaparin (LOVENOX) injection  30 mg Subcutaneous Q24H  . ezetimibe  10 mg Oral Daily  . gabapentin  100 mg Oral QHS  . insulin aspart  0-9 Units Subcutaneous TID WC  . insulin detemir  48 Units Subcutaneous BID  . labetalol  200 mg Oral BID  . sertraline  25 mg Oral Daily  . sevelamer carbonate  800 mg Oral TID WC  . Vitamin D (Ergocalciferol)  50,000 Units Oral Q7 days     Physical Exam: Vital signs in last 24 hours: Temp:  [97.7 F (36.5 C)-99 F (37.2 C)] 97.7 F (36.5 C) (07/03 0902) Pulse Rate:  [86-116] 116 (07/03 1000) Resp:  [17-18] 18 (07/03 0949) BP: (139-183)/(70-82) 139/82 (07/03 1000) SpO2:  [95 %-99 %] 95 % (07/03 1000) Weight:  [184 lb 15.5 oz (83.9 kg)] 184 lb 15.5 oz (83.9 kg) (07/03 0452) Weight change: 2 lb 10.3 oz (1.2 kg) Last BM Date: 09/21/17  Intake/Output from previous day: 07/02 0701 - 07/03 0700 In: 815.8 [P.O.:480; I.V.:335.8] Out: 1150 [Urine:1150] Total I/O In: 240 [P.O.:240] Out: -    Physical Exam: General- pt is awake,alert, oriented to time place and person Resp- No acute REsp distress, NO Rhonchi CVS- S1S2 regular ij rate and rhythm GIT- BS+, soft, NT,  ND EXT- NO LE Edema, NO Cyanosis Access- left AVF ( bruit present)  Lab Results: CBC Recent Labs    09/20/17 0450 09/21/17 0451  WBC 9.8 10.8*  HGB 9.3* 9.0*  HCT 30.6* 30.1*  PLT 476* 459*    BMET Recent Labs    09/21/17 0451 09/22/17 0436  NA 143 147*  K 3.7 4.1  CL 106 111  CO2 23 25  GLUCOSE 257* 180*  BUN 95* 85*  CREATININE 4.99* 4.30*  CALCIUM 9.7 10.1   Creat trend 2019 4.0=> 7.0=> 4.9=>4.3     2.8--3.7  2018  2.3--2.6 2017  1.4--2.5 2016  1.4--1.6 2014  1.0--1.2 2012  0.9--1.75 2010  0.8--1.3 2009  1.1--1.5 2008 1.26   MICRO Recent Results (from the past 240 hour(s))  Urine culture     Status: Abnormal   Collection Time: 09/19/17  2:35 PM  Result Value Ref Range Status   Specimen Description   Final    URINE, CLEAN CATCH Performed at [facility], [address], [place], [place] [address]    Special Requests   Final    NONE Performed at [facility], [address] [place], [place] [address]    Culture (A)  Final    <10,000 COLONIES/mL INSIGNIFICANT  GROWTH Performed at [facility], [place] [address], [place], [place] [address]    Report Status 09/20/2017 FINAL  Final      Lab Results  Component Value Date   PTH 128 (H) 09/01/2017   CALCIUM 10.1 09/22/2017   CAION 1.15 08/31/2017   PHOS 3.7 09/22/2017               Impression: 1)Renal  AKI secondary to ATN                AKI on CKD               CKD stage 5.               CKD since 2008               CKD secondary to DM/HTN                Progression of CKD marked with multiple AKI                Proteinura  Present              2)HTN  Medication- On Alpha and beta Blockers  3)Anemia HGb at goal (9--11)   4)CKD Mineral-Bone Disorder PTH acceptable Secondary Hyperparathyroidism  present  Phosphorus at goal.   5)CHF-distolic Primary MD following  6)Electrolytes  Normokalemic  Hypernatremic   Sec to IVF  7)Acid base Co2 at  goal     Plan:  Will encourage free water intake Will continue the current tx Will ask for BMET. I discussed CKD/Dialysis/differnet modalities of dialysis . I spent more than 45 mins answering the questions to best of my ability.     Kharter Sestak S 09/22/2017, 10:01 AM

## 2017-09-23 ENCOUNTER — Encounter: Payer: Self-pay | Admitting: Internal Medicine

## 2017-09-23 ENCOUNTER — Inpatient Hospital Stay
Admission: RE | Admit: 2017-09-23 | Discharge: 2017-10-01 | Disposition: A | Discharge status: Home / Self Care | Payer: 59 | Source: Ambulatory Visit | Attending: Internal Medicine | Admitting: Internal Medicine

## 2017-09-23 ENCOUNTER — Non-Acute Institutional Stay (SKILLED_NURSING_FACILITY): Payer: 59 | Admitting: Internal Medicine

## 2017-09-23 DIAGNOSIS — E1121 Type 2 diabetes mellitus with diabetic nephropathy: Secondary | ICD-10-CM

## 2017-09-23 DIAGNOSIS — I5032 Chronic diastolic (congestive) heart failure: Secondary | ICD-10-CM

## 2017-09-23 DIAGNOSIS — N185 Chronic kidney disease, stage 5: Secondary | ICD-10-CM | POA: Diagnosis not present

## 2017-09-23 DIAGNOSIS — IMO0002 Reserved for concepts with insufficient information to code with codable children: Secondary | ICD-10-CM

## 2017-09-23 DIAGNOSIS — R55 Syncope and collapse: Secondary | ICD-10-CM | POA: Diagnosis not present

## 2017-09-23 DIAGNOSIS — I951 Orthostatic hypotension: Secondary | ICD-10-CM

## 2017-09-23 DIAGNOSIS — E1165 Type 2 diabetes mellitus with hyperglycemia: Secondary | ICD-10-CM

## 2017-09-23 DIAGNOSIS — D638 Anemia in other chronic diseases classified elsewhere: Secondary | ICD-10-CM

## 2017-09-23 LAB — BASIC METABOLIC PANEL
Anion gap: 9 (ref 5–15)
BUN: 71 mg/dL — AB (ref 8–23)
CHLORIDE: 111 mmol/L (ref 98–111)
CO2: 25 mmol/L (ref 22–32)
Calcium: 9.8 mg/dL (ref 8.9–10.3)
Creatinine, Ser: 3.95 mg/dL — ABNORMAL HIGH (ref 0.44–1.00)
GFR calc Af Amer: 13 mL/min — ABNORMAL LOW (ref >60)
GFR calc non Af Amer: 11 mL/min — ABNORMAL LOW (ref >60)
Glucose, Bld: 174 mg/dL — ABNORMAL HIGH (ref 70–99)
POTASSIUM: 4.3 mmol/L (ref 3.5–5.1)
SODIUM: 145 mmol/L (ref 135–145)

## 2017-09-23 LAB — CBC
HEMATOCRIT: 29.1 % — AB (ref 36.0–46.0)
HEMOGLOBIN: 8.5 g/dL — AB (ref 12.0–15.0)
MCH: 22.2 pg — ABNORMAL LOW (ref 26.0–34.0)
MCHC: 29.2 g/dL — ABNORMAL LOW (ref 30.0–36.0)
MCV: 76 fL — AB (ref 78.0–100.0)
Platelets: 381 10*3/uL (ref 150–400)
RBC: 3.83 MIL/uL — AB (ref 3.87–5.11)
RDW: 16.9 % — AB (ref 11.5–15.5)
WBC: 9.6 10*3/uL (ref 4.0–10.5)

## 2017-09-23 LAB — GLUCOSE, CAPILLARY
GLUCOSE-CAPILLARY: 157 mg/dL — AB (ref 70–99)
Glucose-Capillary: 193 mg/dL — ABNORMAL HIGH (ref 70–99)

## 2017-09-23 LAB — MAGNESIUM: Magnesium: 1.6 mg/dL — ABNORMAL LOW (ref 1.7–2.4)

## 2017-09-23 MED ORDER — SODIUM CHLORIDE 0.9 % IV BOLUS
500.0000 mL | Freq: Once | INTRAVENOUS | Status: AC
  Administered 2017-09-23: 500 mL via INTRAVENOUS

## 2017-09-23 MED ORDER — TORSEMIDE 20 MG PO TABS: 40.0000 mg | ORAL_TABLET | Freq: Every day | ORAL | 0 refills | Status: DC | PRN

## 2017-09-23 MED ORDER — ACETAMINOPHEN 325 MG PO TABS: 650.0000 mg | ORAL_TABLET | Freq: Four times a day (QID) | ORAL | 0 refills | Status: DC | PRN

## 2017-09-23 MED ORDER — SEVELAMER CARBONATE 800 MG PO TABS: 800.0000 mg | ORAL_TABLET | Freq: Three times a day (TID) | ORAL | 0 refills | Status: AC

## 2017-09-23 NOTE — Progress Notes (Signed)
Physical Therapy Treatment Patient Details Name: Kathryn Beck MRN: [id number] DOB: [birth year] Today's Date: 09/23/2017    History of Present Illness (P) Kathryn Beck is a 62 y.o. female with hx of HFpEF, OSA (not on CPAP), CKD stage 4-5 (new AV fistula in LUE placed ~ 2 weeks ago), IDDM who presented with recurrent syncope x 2 episodes in the past 3 days. Patient had a recent hospital admission for (HFpEF) CHF exacerbation and underwent IV diuresis and was on maintenance torsemide 100mg  daily, then discharged to [facility] due to overall weakness and deconditioning. The first episode was while she was taking a bath (witnessed, while in the bath tub); this time, she was on the toilet when she felt acutely dizzy then lost consciousness. Regained consciousness within 2 minutes. Initial concern for seizure due to patient's "eyes rolling back" during the event. However, she reportedly was responsive within 5 minutes of the event. No incontinence or tongue biting reported. Her BP was notably 70s/50s.  Still makes urine although reports UOP has decreased over the past week. Also notes reduced appetite. Denies fever, cough or dysuria. No GI sx. No chest pain or palpitations. She reports that over the past few weeks she has had near-syncope and dizziness with postural changes ie lying to sitting    PT Comments    Pt sitting in chair with family in room at entrance.  Pt willing to participate with therapy today.  Noted Bil ankle edema present with some difficulty as family members put on shoes.  Mod A with sit to stand and min A with gait training.  Pt increased distance with gait to 20 ft slow and labored, no LOB episodes.  EOS pt left in chair with call bell within reach.  Pt LE elevated and instructed ankle pumps to assist with edema/pain control.    Follow Up Recommendations  SNF     Equipment Recommendations  None recommended by PT    Recommendations for Other Services       Precautions  / Restrictions Precautions Precautions: (P) Fall Restrictions Weight Bearing Restrictions: (P) No    Mobility  Bed Mobility                  Transfers                    Ambulation/Gait                 Stairs             Wheelchair Mobility    Modified Rankin (Stroke Patients Only)       Balance                                            Cognition                                              Exercises General Exercises - Lower Extremity Ankle Circles/Pumps: AROM;10 reps;Seated    General Comments        Pertinent Vitals/Pain Pain Score: 4  Pain Location: B LE  Pain Descriptors / Indicators: Aching(Edema present Bil ankle) Pain Intervention(s): Limited activity within patient's tolerance;Monitored during session;Repositioned(LE elevated and instructed ankle pumps)    Home  Living Family/patient expects to be discharged to:: (P) Private residence Living Arrangements: (P) Spouse/significant other Available Help at Discharge: (P) Family Type of Home: (P) House Home Access: (P) Stairs to enter Entrance Stairs-Rails: (P) Can reach both Home Layout: (P) Able to live on main level with bedroom/bathroom        Prior Function            PT Goals (current goals can now be found in the care plan section)      Frequency    Min 5X/week      PT Plan Current plan remains appropriate    Co-evaluation              AM-PAC PT "6 Clicks" Daily Activity  Outcome Measure  Difficulty turning over in bed (including adjusting bedclothes, sheets and blankets)?: A Little Difficulty moving from lying on back to sitting on the side of the bed? : A Little Difficulty sitting down on and standing up from a chair with arms (e.g., wheelchair, bedside commode, etc,.)?: Unable Help needed moving to and from a bed to chair (including a wheelchair)?: A Little Help needed walking in hospital room?: A  Little Help needed climbing 3-5 steps with a railing? : A Lot 6 Click Score: 15    End of Session Equipment Utilized During Treatment: Gait belt Activity Tolerance: Patient tolerated treatment well Patient left: in chair;with call bell/phone within reach;with chair alarm set;with family/visitor present Nurse Communication: Mobility status PT Visit Diagnosis: Unsteadiness on feet (R26.81);Repeated falls (R29.6);Muscle weakness (generalized) (M62.81);History of falling (Z91.81);Difficulty in walking, not elsewhere classified (R26.2) Pain - part of body: Leg     Time: 9323-5573 PT Time Calculation (min) (ACUTE ONLY): 20 min  Charges:  $Therapeutic Activity: 8-22 mins                    G Codes:       Ihor Austin, LPTA; CBIS [phone]   Aldona Lento 09/23/2017, 10:53 AM

## 2017-09-23 NOTE — Progress Notes (Signed)
This is an acute visit  Level of care is skilled.  Facility is [facility], operational complaint.  Acute visit status post hospitalization for syncope and hypotension  History of present illness.  Patient is a pleasant 62 year old female recently admitted to skilled nursing with a history of stage IV-5 chronic kidney disease- complicated with diastolic CHF-   She was recently hospitalized for diastolic CHF exacerbation- underwent IV diuresis and was on maintenance Demadex  She was originally admitted to skilled nursing with weakness and deconditioning.  She did have.  Syncope however in skilled nursing with suspected orthostatic hypotension.  Her diuretics had been reduced but continued to have a syncopal episode  She was readmitted to the hospital and started on IV fluid hydration.  Apparently she improved.--- Diuretics were stopped-with recommendation to restart torsemide 40 mg--if weight gain greater than 3 pounds in the day-5 pounds in a week  Her orthostatics apparently improved.  Renal function also improved somewhat-with a creatinine of 3.95 and BUN of 71 on discharge.  Her labetalol also has been held she continues on clonidine at night she is also on Imdur  This afternoon she says she feels significantly better-apparently she did ambulate some in the hospital earlier today---she is currently resting comfortably.  Blood pressure taken manually is 140/62  Past Medical History:  Diagnosis Date  . Acid reflux   . Anemia   . Arthritis   . Axillary masses    Soft tissue - status post excision  . Back pain   . Chronic kidney disease   . Diastolic heart failure ([facility])   . Essential hypertension   . Ischemic heart disease    Abnormal Myoview April 2018 - medical therapy  . Mixed hyperlipidemia   . Obesity   . Pancreatitis   . Sleep apnea    Noncompliant with CPAP  . Type 2 diabetes mellitus, uncontrolled ([place])     Patient Active Problem List     Diagnosis Date Noted  . Bilateral leg weakness 09/09/2017  . Adrenal adenoma 09/03/2017  . Acute exacerbation of CHF (congestive heart failure) ([place]) 08/31/2017  . Edema due to hypoalbuminemia 08/31/2017  . Shortness of breath   . Community acquired pneumonia 08/18/2017  . Uncontrolled type 2 diabetes mellitus with nephropathy ([place]) 08/18/2017  . Acute renal failure superimposed on stage 3 chronic kidney disease ([place]) 08/18/2017  . Unsteady gait 07/11/2017  . Recurrent falls 07/11/2017  . Anemia of chronic disease 03/24/2017  . History of colonic polyps 02/10/2017  . Dysphagia 11/05/2016  . IBS (irritable bowel syndrome) 02/04/2016  . Hospital discharge follow-up 10/27/2015  . Personal history of noncompliance with medical treatment, presenting hazards to health 10/21/2015  . Postprandial epigastric pain   . Heat sensitivity 10/07/2014  . Cardiac murmur 01/17/2014  . Hyperuricemia 08/04/2013  . Seasonal allergies 03/10/2013  . Lipoma of back 12/22/2012  . GERD (gastroesophageal reflux disease) 08/22/2012  . Sleep apnea 06/02/2011  . CKD (chronic kidney disease) stage 5, GFR less than 15 ml/min ([facility]) 01/13/2011  . BACK PAIN WITH RADICULOPATHY 04/16/2010  . FATIGUE 07/24/2009  . LIVER FUNCTION TESTS, ABNORMAL, HX OF 05/08/2009  . LUPUS ERYTHEMATOSUS, DISCOID 06/05/2008  . Uncontrolled diabetes mellitus with diabetic nephropathy, with long-term current use of insulin ([place]) 06/07/2007  . HLD (hyperlipidemia) 06/07/2007  . Obesity 06/07/2007  . Malignant hypertension 06/07/2007         Past Surgical History:  Procedure Laterality Date  . ABDOMINAL HYSTERECTOMY    . AV  FISTULA PLACEMENT Left 09/02/2017   Procedure: creation of left arm ARTERIOVENOUS (AV) FISTULA;  Surgeon: Serafina Mitchell, MD;  Location: [facility] OR;  Service: Vascular;  Laterality: Left;  . COLONOSCOPY  2008   Dr. Oneida Alar: normal   . COLONOSCOPY N/A 12/18/2016   Dr. Oneida Alar: multiple tubular adenomas,  internal hemorrhoids. Surveillance in 3 years   . ESOPHAGEAL DILATION N/A 10/13/2015   Procedure: ESOPHAGEAL DILATION;  Surgeon: Rogene Houston, MD;  Location: [facility] SUITE;  Service: Endoscopy;  Laterality: N/A;  . ESOPHAGOGASTRODUODENOSCOPY N/A 10/13/2015   Dr. Laural Golden: chronic gastritis on path, no H.pylori. Empiric dilation   . ESOPHAGOGASTRODUODENOSCOPY N/A 12/18/2016   Dr. Oneida Alar: mild gastritis. BRAVO study revealed uncontrolled GERD. Dysphagia secondary to uncontrolled reflux  . FOOT SURGERY Bilateral   . LUNG BIOPSY    . MASS EXCISION Right 01/09/2013   Procedure: EXCISION OF NEOPLASM OF RIGHT  AXILLA  AND EXCISION OF NEOPLASM OF LEFT AXILLA;  Surgeon: Jamesetta So, MD;  Location: [facility] ORS;  Service: General;  Laterality: Right;  procedure end @ 08:23  . MYRINGOTOMY WITH TUBE PLACEMENT Bilateral 04/28/2017   Procedure: BILATERAL MYRINGOTOMY WITH TUBE PLACEMENT;  Surgeon: Leta Baptist, MD;  Location: [place];  Service: ENT;  Laterality: Bilateral;  . SAVORY DILATION N/A 12/18/2016   Procedure: SAVORY DILATION;  Surgeon: Danie Binder, MD;  Location: [facility];  Service: Endoscopy;  Laterality: N/A;                OB History   None     MEDICATIONS   acetaminophen 325 MG tablet Commonly known as:  TYLENOL Take 2 tablets (650 mg total) by mouth every 6 (six) hours as needed for mild pain or headache. What changed:    medication strength  how much to take  reasons to take this   aspirin 81 MG EC tablet Take 1 tablet (81 mg total) by mouth daily. With Food   atorvastatin 20 MG tablet Commonly known as:  LIPITOR Take 1 tablet (20 mg total) by mouth daily.   azelastine 0.1 % nasal spray Commonly known as:  ASTELIN Place 2 sprays into both nostrils 2 (two) times daily. Use in each nostril as directed   BENTYL 10 MG capsule Generic drug:  dicyclomine Take 10 mg by mouth 4 (four) times daily as needed for spasms.   calcitRIOL 0.25 MCG capsule Commonly known  as:  ROCALTROL Take 0.25 mcg by mouth every Monday, Wednesday, and Friday.   calcium carbonate (dosed in mg elemental calcium) 1250 MG/5ML Susp Take 5 mLs (500 mg of elemental calcium total) by mouth every 6 (six) hours as needed for indigestion.   camphor-menthol lotion Commonly known as:  SARNA Apply 1 application topically every 8 (eight) hours as needed for itching.   cloNIDine 0.1 MG tablet Commonly known as:  CATAPRES TAKE 1 TABLET BY MOUTH  EVERY EVENING AT 9PM FOR  BLOOD PRESSURE   dexlansoprazole 60 MG capsule Commonly known as:  DEXILANT 1 PO EVERY MORNING WITH BREAKFAST.   dextromethorphan-guaiFENesin 30-600 MG 12hr tablet Commonly known as:  MUCINEX DM Take 1 tablet by mouth 2 (two) times daily.   ezetimibe 10 MG tablet Commonly known as:  ZETIA Take 1 tablet (10 mg total) by mouth daily.   gabapentin 100 MG capsule Commonly known as:  NEURONTIN Take 1 capsule (100 mg total) by mouth 2 (two) times daily.   hydrOXYzine 25 MG tablet Commonly known as:  ATARAX/VISTARIL Take 1 tablet (  25 mg total) by mouth every 8 (eight) hours as needed for itching.   insulin detemir 100 UNIT/ML injection Commonly known as:  LEVEMIR Inject 0.55 mLs (55 Units total) into the skin 2 (two) times daily.   isosorbide mononitrate 30 MG 24 hr tablet Commonly known as:  IMDUR Take 1 tablet (30 mg total) by mouth daily.   NOVOLOG 100 UNIT/ML injection Generic drug:  insulin aspart Inject 8 Units into the skin 3 (three) times daily with meals.   ondansetron 4 MG disintegrating tablet Commonly known as:  ZOFRAN ODT Take 1 tablet (4 mg total) by mouth every 8 (eight) hours as needed for nausea or vomiting.   senna-docusate 8.6-50 MG tablet Commonly known as:  Senokot-S Take 2 tablets by mouth at bedtime as needed for mild constipation or moderate constipation.   sertraline 25 MG tablet Commonly known as:  ZOLOFT Take 25 mg by mouth daily.   sevelamer carbonate 800  MG tablet Commonly known as:  RENVELA Take 1 tablet (800 mg total) by mouth 3 (three) times daily with meals.   torsemide 20 MG tablet Commonly known as:  DEMADEX Take 2 tablets (40 mg total) by mouth daily as needed (edema or weight gain). What changed:    medication strength  how much to take  when to take this  reasons to take this   traMADol 50 MG tablet Commonly known as:  ULTRAM Take 25 mg by mouth every 6 (six) hours as needed for moderate pain or severe pain.   Vitamin D (Ergocalciferol) 50000 units Caps capsule Commonly known as:  DRISDOL Take 1 capsule (50,000 Units total) by mouth every 7 (seven) days.       Review of systems.  In general she says she is feeling significantly better does not complain of fever chills.  Skin is not complaining of rashes or itching.  Head ears eyes nose mouth and throat is not complaining of sore throat or visual changes.  Respiratory does not complain of being short of breath or having a cough at this time.  Cardiac does not complain of chest pain appears to have some fairly baseline mild lower extremity edema  GI is not complaining of abdominal pain nausea vomiting diarrhea or constipation  GU is not complaining of dysuria she does have chronic kidney disease  Musculoskeletal complains of leg pain at times- Otherwise is not complaining of pain  Neurologic is not complaining of dizziness headache or syncope-does have a history of neuropathy   Psych does not complain of being depressed or anxious appears to be in good spirits       Physical exam.  Temp is 99.5-pulse is 80-respirations 18- blood pressure 140/62   In general this is a pleasant middle-age female in no distress lying comfortably in bed  Her skin is warm and dry   Eyes visual acuity appears to be intact sclera and conjunctive are clear  Oropharynx is clear mucous membranes moist   Chest is clear to auscultation there is no labored  breathing  Heart is regular rate and rhythm without murmur gallop or rub she has mild lower extremity edema.  Abdomen is obese soft nontender with positive bowel sounds  Muscle skeletal limited exam since she is in bed but is able to move all extremities x4 appears to be intact all extremities with some deconditioning l with resulting lower extremity weakness   Neurologic appears to be grossly intact no lateralizing findings her speech is clear she is alert  Psych she is alert and oriented pleasant and appropriate   Labs.  September 23, 2017  WBC 9.6-hemoglobin 8.5 platelets 381  Sodium 145 potassium 4.3 BUN 71 creatinine 3.95  Magnesium 1.6   Assessment and plan.  1.  History of syncope thought secondary to orthostasis- diuretics have been held labetalol also is on hold- she has improved significantly-with recommendation to avoid aggressive diuresis.   In regards to CHF She does have an order for torsemide 40 mg for significant weight gain of greater than 3 pounds in a day or 5 pounds in a week  #2 acute on chronic kidney disease stage V- nephrology has followed her with recommendations for likely dialysis at some point in the future in the outpatient setting- follow-up apparently will be done in the next 2 weeks Creatinine was improved at discharge 3.95 with BUN of 71  #3 history of insulin-dependent diabetes-she is on Lantus 5 units twice daily as well as NovoLog 8 units at lunch and supper if CBG is greater than 1 50-at this point will monitor blood sugars it appears today in the hospital were in the higher mid 100s   #4- hypertension at this point will monitor again hypotension has been an issue in the past- manual blood pressure today was 140/80- recommendation is to continue Imdur also has clonidine at night holding the labetalol as well as the diuretic.  5.  History of diabetic neuropathy she does continue on Neurontin  #6 history of hypocalcemia this was thought  secondary to chronic kidney disease she is on calcium supplementation-is on Calcitrol.  7 history of hyperlipidemia she is on a statin as well as Zetia.  8.  History of GERD continues on Dexilant.  9.  History of leg pain she does have an order for tramadol as needed- at this point will monitor.  10 Anemia of chronic disease at this point will monitor hemoglobin was 8.5 at discharge  Again she does have nephrology follow-up scheduled as well as endocrinology follow-up  Updated labs are pending for Saturday, July 6 including a CBC and metabolic panel-will add a magnesium level as well   [id number]-of note greater than 40 minutes spent assessing patient-reviewing her chart and labs- coordinating and formulating a plan of care for numerous diagnoses- of note greater than 50% of time spent coordinating a plan of care

## 2017-09-23 NOTE — Progress Notes (Signed)
Report called into the [facility] at 1240. IV removed, patient tolerated procedure well. Updated daughter at bedside.

## 2017-09-23 NOTE — Discharge Summary (Signed)
Physician Discharge Summary  Kathryn Beck GHW:[id number] DOB: July 14, 1955 DOA: 09/19/2017  PCP: Fayrene Helper, MD  Admit date: 09/19/2017  Discharge date: 09/23/2017  Admitted From:SNF  Disposition:  SNF  Recommendations for Outpatient Follow-up:  1. Follow up with PCP in 1 month 2. Follow-up with nephrology in 2 weeks as scheduled; patient and daughter to decide where to follow-up  Home Health: None  Equipment/Devices: None  Discharge Condition: Stable  CODE STATUS: Full  Diet recommendation: Heart Healthy-no fluid restriction  Brief/Interim Summary:  62 year old woman admitted from skilled nursing facility on 6/30 with complaints of syncope and hypotension. She has a history significant for diastolic heart failure, obstructive sleep apnea, stage IV-V kidney disease and insulin-dependent diabetes who has had at least 2 syncopal episodes in the past 3 days. Patient had a recent hospital admission for diastolic heart failure exacerbation, underwent IV diuresis and was on maintenance torsemide and then discharged to the [facility] skilled nursing facility due to overall weakness and deconditioning. She was found to be significantly hypotensive and orthostatic for which she was started on IV fluid hydration with gradual improvement noted.  Her orthostatics are improved this morning and she has no further symptomatology upon sitting and standing.  Function has continued to improve with BUN 71 and creatinine 3.95 on day of discharge.  She was previously on torsemide 100 mg daily which appears to have been too much diuresis.  She will now be discharged on torsemide 40 mg as needed for significant weight gain of 3 pounds over the course of 24 hours or 5 pounds over the course of [redacted] week along with symptoms of shortness of breath or leg edema.  Additionally, she will not be on her labetalol but will continue the rest of her blood pressure medications.  She has been counseled by nephrology  on initiation of dialysis with close follow-up recommended per Dr. Lowanda Foster.   Discharge Diagnoses:  Principal Problem:   Orthostatic hypotension Active Problems:   Uncontrolled diabetes mellitus with diabetic nephropathy, with long-term current use of insulin (HCC)   HLD (hyperlipidemia)   Malignant hypertension   CKD (chronic kidney disease) stage 5, GFR less than 15 ml/min (HCC)   GERD (gastroesophageal reflux disease)   AKI (acute kidney injury) ([place])   Syncope   Chronic diastolic CHF (congestive heart failure) ([place])   1. Syncope secondary to orthostasis.  No further orthostasis noted after fluid bolusing.  She is much less symptomatic and otherwise feeling better this morning.  Avoid aggressive diuresis and use torsemide 40 mg as needed. 2. AKI on CKD stage V.  Appreciate nephrology recommendations with likely plans for hemodialysis initiation in the outpatient setting.  This was discussed with patient and daughter with plans for follow-up in the next 2 weeks. 3. IDDM.    Continue on home medications. 4. Hypertension.    Continue home medications of Imdur and clonidine and hold labetalol for orthostasis.  Torsemide as needed as described above. 5. Diabetic neuropathy.  Continue gabapentin. 6. Hypocalcemia secondary to chronic kidney disease.  Maintain on calcitriol. 7. Dyslipidemia.  Continue atorvastatin and Zetia. 8. Weakness and deconditioning.  Return to skilled nursing facility today.Marland Kitchen    Discharge Instructions  Discharge Instructions    Diet - low sodium heart healthy   Complete by:  As directed    Increase activity slowly   Complete by:  As directed      Allergies as of 09/23/2017      Reactions   Ace Inhibitors  Anaphylaxis, Swelling   Penicillins Itching, Swelling, Other (See Comments)   SWELLING REACTION UNSPECIFIED  PATIENT HAS HAD A PCN REACTION WITH IMMEDIATE RASH, FACIAL/TONGUE/THROAT SWELLING, SOB, OR LIGHTHEADEDNESS WITH HYPOTENSION:  #  #  YES  #  #  Has  patient had a PCN reaction causing severe rash involving mucus membranes or skin necrosis: No Has patient had a PCN reaction that required hospitalization No Has patient had a PCN reaction occurring within the last 10 years: No If all of the above answers are "NO", then may proceed with Cephalosporin use.   Statins Other (See Comments)   elevated LFT's   Albuterol Swelling   SWELLING REACTION UNSPECIFIED       Medication List    STOP taking these medications   labetalol 200 MG tablet Commonly known as:  NORMODYNE   methocarbamol 500 MG tablet Commonly known as:  ROBAXIN     TAKE these medications   acetaminophen 325 MG tablet Commonly known as:  TYLENOL Take 2 tablets (650 mg total) by mouth every 6 (six) hours as needed for mild pain or headache. What changed:    medication strength  how much to take  reasons to take this   aspirin 81 MG EC tablet Take 1 tablet (81 mg total) by mouth daily. With Food   atorvastatin 20 MG tablet Commonly known as:  LIPITOR Take 1 tablet (20 mg total) by mouth daily.   azelastine 0.1 % nasal spray Commonly known as:  ASTELIN Place 2 sprays into both nostrils 2 (two) times daily. Use in each nostril as directed   BENTYL 10 MG capsule Generic drug:  dicyclomine Take 10 mg by mouth 4 (four) times daily as needed for spasms.   calcitRIOL 0.25 MCG capsule Commonly known as:  ROCALTROL Take 0.25 mcg by mouth every Monday, Wednesday, and Friday.   calcium carbonate (dosed in mg elemental calcium) 1250 MG/5ML Susp Take 5 mLs (500 mg of elemental calcium total) by mouth every 6 (six) hours as needed for indigestion.   camphor-menthol lotion Commonly known as:  SARNA Apply 1 application topically every 8 (eight) hours as needed for itching.   cloNIDine 0.1 MG tablet Commonly known as:  CATAPRES TAKE 1 TABLET BY MOUTH  EVERY EVENING AT 9PM FOR  BLOOD PRESSURE   dexlansoprazole 60 MG capsule Commonly known as:  DEXILANT 1 PO EVERY  MORNING WITH BREAKFAST.   dextromethorphan-guaiFENesin 30-600 MG 12hr tablet Commonly known as:  MUCINEX DM Take 1 tablet by mouth 2 (two) times daily.   ezetimibe 10 MG tablet Commonly known as:  ZETIA Take 1 tablet (10 mg total) by mouth daily.   gabapentin 100 MG capsule Commonly known as:  NEURONTIN Take 1 capsule (100 mg total) by mouth 2 (two) times daily.   hydrOXYzine 25 MG tablet Commonly known as:  ATARAX/VISTARIL Take 1 tablet (25 mg total) by mouth every 8 (eight) hours as needed for itching.   insulin detemir 100 UNIT/ML injection Commonly known as:  LEVEMIR Inject 0.55 mLs (55 Units total) into the skin 2 (two) times daily.   isosorbide mononitrate 30 MG 24 hr tablet Commonly known as:  IMDUR Take 1 tablet (30 mg total) by mouth daily.   NOVOLOG 100 UNIT/ML injection Generic drug:  insulin aspart Inject 8 Units into the skin 3 (three) times daily with meals.   ondansetron 4 MG disintegrating tablet Commonly known as:  ZOFRAN ODT Take 1 tablet (4 mg total) by mouth every 8 (eight) hours  as needed for nausea or vomiting.   senna-docusate 8.6-50 MG tablet Commonly known as:  Senokot-S Take 2 tablets by mouth at bedtime as needed for mild constipation or moderate constipation.   sertraline 25 MG tablet Commonly known as:  ZOLOFT Take 25 mg by mouth daily.   sevelamer carbonate 800 MG tablet Commonly known as:  RENVELA Take 1 tablet (800 mg total) by mouth 3 (three) times daily with meals.   torsemide 20 MG tablet Commonly known as:  DEMADEX Take 2 tablets (40 mg total) by mouth daily as needed (edema or weight gain). What changed:    medication strength  how much to take  when to take this  reasons to take this   traMADol 50 MG tablet Commonly known as:  ULTRAM Take 25 mg by mouth every 6 (six) hours as needed for moderate pain or severe pain.   Vitamin D (Ergocalciferol) 50000 units Caps capsule Commonly known as:  DRISDOL Take 1 capsule  (50,000 Units total) by mouth every 7 (seven) days.      Follow-up Information    Fran Lowes, MD Follow up in 2 week(s).   Specialty:  Nephrology Contact information: [address] [address] [phone]        Fayrene Helper, MD Follow up in 1 month(s).   Specialty:  Family Medicine Contact information: [address] [place] [address] [phone]        Satira Sark, MD .   Specialty:  Cardiology Contact information: [place] [address] [phone]          Allergies  Allergen Reactions  . Ace Inhibitors Anaphylaxis and Swelling  . Penicillins Itching, Swelling and Other (See Comments)    SWELLING REACTION UNSPECIFIED   PATIENT HAS HAD A PCN REACTION WITH IMMEDIATE RASH, FACIAL/TONGUE/THROAT SWELLING, SOB, OR LIGHTHEADEDNESS WITH HYPOTENSION:  #  #  YES  #  #  Has patient had a PCN reaction causing severe rash involving mucus membranes or skin necrosis: No Has patient had a PCN reaction that required hospitalization No Has patient had a PCN reaction occurring within the last 10 years: No If all of the above answers are "NO", then may proceed with Cephalosporin use.   . Statins Other (See Comments)    elevated LFT's  . Albuterol Swelling    SWELLING REACTION UNSPECIFIED     Consultations:  Nephrology   Procedures/Studies: Dg Chest 1 View  Result Date: 09/19/2017 CLINICAL DATA:  Recent syncopal episode EXAM: CHEST  1 VIEW COMPARISON:  09/16/2017 FINDINGS: Cardiac shadow is stable. The lungs are well aerated bilaterally. No focal infiltrate or sizable effusion is seen. No acute bony abnormality is noted. IMPRESSION: No active disease. Electronically Signed   By: Inez Catalina M.D.   On: 09/19/2017 15:33   Dg Chest 2 View  Result Date: 09/16/2017 CLINICAL DATA:  Syncopal episode while sitting today. EXAM: CHEST - 2 VIEW COMPARISON:  08/31/2017 FINDINGS: Lungs are hypoinflated and  otherwise clear. Cardiomediastinal silhouette and remainder of the exam is unchanged. IMPRESSION: Hypoinflation without acute cardiopulmonary disease. Electronically Signed   By: Marin Olp M.D.   On: 09/16/2017 15:41   Dg Chest 2 View  Result Date: 08/31/2017 CLINICAL DATA:  Recent diagnosis of pneumonia. Bilateral lower extremity swelling and pain. EXAM: CHEST - 2 VIEW COMPARISON:  Chest radiograph Aug 18, 2017 FINDINGS: Cardiac silhouette is mildly enlarged. Mediastinal silhouette is not suspicious. Mildly calcified aortic arch. Pulmonary  vascular congestion and mild interstitial prominence. LEFT lung base pleural thickening without pleural effusion or focal consolidation. No pneumothorax. Soft tissue planes and included osseous structures are unchanged. IMPRESSION: Mild cardiomegaly. Mild interstitial prominence, possible pulmonary edema. Aortic Atherosclerosis (ICD10-I70.0). Electronically Signed   By: Elon Alas M.D.   On: 08/31/2017 14:12   Ct Head Wo Contrast  Result Date: 09/19/2017 CLINICAL DATA:  Syncopal episode during bowel movement EXAM: CT HEAD WITHOUT CONTRAST TECHNIQUE: Contiguous axial images were obtained from the base of the skull through the vertex without intravenous contrast. COMPARISON:  05/28/2011 FINDINGS: Brain: No evidence of acute infarction, hemorrhage, hydrocephalus, extra-axial collection or mass lesion/mass effect. Vascular: No hyperdense vessel or unexpected calcification. Skull: Normal. Negative for fracture or focal lesion. Sinuses/Orbits: No acute finding. Other: None. IMPRESSION: Normal head CT for age Electronically Signed   By: Inez Catalina M.D.   On: 09/19/2017 15:31   Discharge Exam: Vitals:   09/23/17 0546 09/23/17 0550  BP: (!) 149/79 125/68  Pulse: 88 94  Resp:    Temp:    SpO2: 100% 99%   Vitals:   09/23/17 0500 09/23/17 0545 09/23/17 0546 09/23/17 0550  BP:  (!) 142/75 (!) 149/79 125/68  Pulse:  88 88 94  Resp:      Temp:  98.3 F (36.8  C)    TempSrc:  Oral    SpO2:  100% 100% 99%  Weight: 83.6 kg (184 lb 4.9 oz)     Height:        General: Pt is alert, awake, not in acute distress Cardiovascular: RRR, S1/S2 +, no rubs, no gallops Respiratory: CTA bilaterally, no wheezing, no rhonchi Abdominal: Soft, NT, ND, bowel sounds + Extremities: no edema, no cyanosis    The results of significant diagnostics from this hospitalization (including imaging, microbiology, ancillary and laboratory) are listed below for reference.     Microbiology: Recent Results (from the past 240 hour(s))  Urine culture     Status: Abnormal   Collection Time: 09/19/17  2:35 PM  Result Value Ref Range Status   Specimen Description   Final    URINE, CLEAN CATCH Performed at [facility], [address], [place], [place] [address]    Special Requests   Final    NONE Performed at [facility], [address] [place], [place] [address]    Culture (A)  Final    <10,000 COLONIES/mL INSIGNIFICANT GROWTH Performed at [facility] [address], [place], [place] [address]    Report Status 09/20/2017 FINAL  Final     Labs: BNP (last 3 results) Recent Labs    08/15/17 1018 08/18/17 1211 08/31/17 1343  BNP 198.0* 77.0 56.3   Basic Metabolic Panel: Recent Labs  Lab 09/17/17 0733 09/19/17 1144 09/20/17 0450 09/21/17 0451 09/22/17 0436 09/23/17 0451  NA 141 139 142 143 147* 145  K 4.6 4.2 3.9 3.7 4.1 4.3  CL 102 101 104 106 111 111  CO2 24 22 23 23 25 25   GLUCOSE 275* 322* 210* 257* 180* 174*  BUN 118* 116* 116* 95* 85* 71*  CREATININE 7.07* 7.02* 6.45* 4.99* 4.30* 3.95*  CALCIUM 9.7 9.9 9.8 9.7 10.1 9.8  MG  --   --  1.8 1.6* 1.6* 1.6*  PHOS 8.1*  --  6.3* 4.6 3.7  --    Liver Function Tests: Recent Labs  Lab 09/17/17 0733 09/20/17 0450 09/21/17 0451 09/22/17 0436  ALBUMIN 2.9* 2.9* 2.7* 2.6*   No results for input(s):  LIPASE, AMYLASE in the last 168 hours. No results for input(s): AMMONIA in the last 168  hours. CBC: Recent Labs  Lab 09/16/17 1503 09/19/17 1144 09/20/17 0450 09/21/17 0451 09/23/17 0451  WBC 10.6* 8.8 9.8 10.8* 9.6  NEUTROABS 6.5  --   --   --   --   HGB 9.3* 9.4* 9.3* 9.0* 8.5*  HCT 30.6* 30.9* 30.6* 30.1* 29.1*  MCV 75.9* 75.0* 75.6* 75.1* 76.0*  PLT 555* 499* 476* 459* 381   Cardiac Enzymes: Recent Labs  Lab 09/16/17 1503 09/19/17 1438  TROPONINI <0.03 <0.03   BNP: Invalid input(s): POCBNP CBG: Recent Labs  Lab 09/22/17 0725 09/22/17 1119 09/22/17 1721 09/22/17 2104 09/23/17 0746  GLUCAP 163* 193* 136* 205* 157*   D-Dimer No results for input(s): DDIMER in the last 72 hours. Hgb A1c No results for input(s): HGBA1C in the last 72 hours. Lipid Profile No results for input(s): CHOL, HDL, LDLCALC, TRIG, CHOLHDL, LDLDIRECT in the last 72 hours. Thyroid function studies No results for input(s): TSH, T4TOTAL, T3FREE, THYROIDAB in the last 72 hours.  Invalid input(s): FREET3 Anemia work up No results for input(s): VITAMINB12, FOLATE, FERRITIN, TIBC, IRON, RETICCTPCT in the last 72 hours. Urinalysis    Component Value Date/Time   COLORURINE YELLOW 09/19/2017 1144   APPEARANCEUR HAZY (A) 09/19/2017 1144   LABSPEC 1.015 09/19/2017 1144   PHURINE 5.0 09/19/2017 1144   GLUCOSEU 150 (A) 09/19/2017 1144   HGBUR SMALL (A) 09/19/2017 1144   BILIRUBINUR NEGATIVE 09/19/2017 1144   BILIRUBINUR neg 02/15/2017 0913   KETONESUR NEGATIVE 09/19/2017 1144   PROTEINUR >=300 (A) 09/19/2017 1144   UROBILINOGEN 0.2 02/15/2017 0913   NITRITE NEGATIVE 09/19/2017 1144   LEUKOCYTESUR NEGATIVE 09/19/2017 1144   Sepsis Labs Invalid input(s): PROCALCITONIN,  WBC,  LACTICIDVEN Microbiology Recent Results (from the past 240 hour(s))  Urine culture     Status: Abnormal   Collection Time: 09/19/17  2:35 PM  Result Value Ref Range Status   Specimen Description   Final    URINE, CLEAN CATCH Performed at [facility], [address] [place], [place] [address]     Special Requests   Final    NONE Performed at [facility], [address] [place], [place] [address]    Culture (A)  Final    <10,000 COLONIES/mL INSIGNIFICANT GROWTH Performed at [facility], [place] [address] [place], [place] [address]    Report Status 09/20/2017 FINAL  Final     Time coordinating discharge: 40 minutes  SIGNED:   Rodena Goldmann, DO [facility] 09/23/2017, 10:35 AM Pager [phone]  If 7PM-7AM, please contact night-coverage [email or web address] Password [email or web address]

## 2017-09-23 NOTE — Progress Notes (Signed)
Orthostatic blood pressure taken, see flowsheet. [facility] notified of orthostasis and history of CKD and CHF. Will continue to monitor. Orders placed.

## 2017-09-23 NOTE — Progress Notes (Signed)
Subjective: Interval History: Patient continued to feel better.  She denies any difficulty breathing.  Denies any dizziness or lightheadedness and no difficulty breathing.  Objective: Vital signs in last 24 hours: Temp:  [98.3 F (36.8 C)-99.7 F (37.6 C)] 98.3 F (36.8 C) (07/04 0545) Pulse Rate:  [82-116] 94 (07/04 0550) Resp:  [18-20] 20 (07/03 1500) BP: (125-182)/(65-87) 125/68 (07/04 0550) SpO2:  [95 %-100 %] 99 % (07/04 0550) Weight:  [83.6 kg (184 lb 4.9 oz)] 83.6 kg (184 lb 4.9 oz) (07/04 0500) Weight change: -0.3 kg (-10.6 oz)  Intake/Output from previous day: 07/03 0701 - 07/04 0700 In: 592.1 [P.O.:240; IV Piggyback:352.1] Out: 550 [Urine:550] Intake/Output this shift: No intake/output data recorded.  General appearance: alert, cooperative and no distress Resp: clear to auscultation bilaterally Cardio: regular rate and rhythm Extremities: No edema  Lab Results: Recent Labs    09/21/17 0451 09/23/17 0451  WBC 10.8* 9.6  HGB 9.0* 8.5*  HCT 30.1* 29.1*  PLT 459* 381   BMET:  Recent Labs    09/22/17 0436 09/23/17 0451  NA 147* 145  K 4.1 4.3  CL 111 111  CO2 25 25  GLUCOSE 180* 174*  BUN 85* 71*  CREATININE 4.30* 3.95*  CALCIUM 10.1 9.8   No results for input(s): PTH in the last 72 hours. Iron Studies: No results for input(s): IRON, TIBC, TRANSFERRIN, FERRITIN in the last 72 hours.  Studies/Results: No results found.  I have reviewed the patient's current medications.  Assessment/Plan: Acute kidney injury superimposed on chronic.  Her renal function continue to improve.  Her BUN is 71 and creatinine is 3.95 today.  Patient presently less symptomatic. 2] chronic renal failure: Stage V.  Possibly secondary to diabetes. 3] hypertension: Her blood pressure is reasonably controlled.. 4] anemia: Her hemoglobin is below our target goal.  Most likely secondary to chronic renal failure.  Her hemoglobin is stable. 5] bone and mineral disorder: Her calcium  and phosphorus is range.  Patient is started on binder. 6] history of CHF: Patient is he denies any difficulty breathing.  She is on Demadex 40 mg. Plan: 1] we we will continue his Demadex 40 mg once a day as needed.  I have discussed with the patient and also her daughter. 2]we will check her renal panel and CBC in the morning. 3] since the patient has been followed in my office for long period  before she was admitted to [facility] and had fistula placed she asked where she will be going to dialysis.  I told her she need to make a decision where she need to be followed as an outpatient since she has an appointment from [facility].  If she decide to be followed by exam she need to go to [facility] dialysis unit if not then she need to come to [facility] clinic [place].  I have left the decision to the patient.  Her daughter was also present during that time.  I have answered also about PD. 4] if she decided to come to the office we will see her in 2 weeks  LOS: 4 days   Lanna Labella S 09/23/2017,9:36 AM

## 2017-09-23 NOTE — Clinical Social Work Note (Signed)
Pt stable for dc back to [facility] today per MD. Had spoken with Marianna Fuss at [facility] yesterday to update. Per Marianna Fuss, they had been given the insurance authorization for pt to re-admit. Pt's insurance had reminded Marianna Fuss that pt only has 30 days of SNF rehab benefits in a calendar year and they wanted pt to be updated on that.   LCSW, Ambrose Pancoast, informed pt of insurance coverage benefits for rehab yesterday and pt indicated that she understood and that she wanted to return to [facility] for more rehab.   Notified DON at [facility] that pt has dc orders for today. DC clinical will be sent through the hub. Updated RN who will call report and assist pt in transferring over there.  There are no other CSW needs for dc.

## 2017-09-23 NOTE — Evaluation (Signed)
Occupational Therapy Evaluation Patient Details Name: Kathryn Beck MRN: [id number] DOB: 1955/12/08 Today's Date: 09/23/2017    History of Present Illness Kathryn Beck is a 62 y.o. female with hx of HFpEF, OSA (not on CPAP), CKD stage 4-5 (new AV fistula in LUE placed ~ 2 weeks ago), IDDM who presented with recurrent syncope x 2 episodes in the past 3 days. Patient had a recent hospital admission for (HFpEF) CHF exacerbation and underwent IV diuresis and was on maintenance torsemide 100mg  daily, then discharged to [facility] SNF due to overall weakness and deconditioning. The first episode was while she was taking a bath (witnessed, while in the bath tub); this time, she was on the toilet when she felt acutely dizzy then lost consciousness. Regained consciousness within 2 minutes. Initial concern for seizure due to patient's "eyes rolling back" during the event. However, she reportedly was responsive within 5 minutes of the event. No incontinence or tongue biting reported. Her BP was notably 70s/50s.  Still makes urine although reports UOP has decreased over the past week. Also notes reduced appetite. Denies fever, cough or dysuria. No GI sx. No chest pain or palpitations. She reports that over the past few weeks she has had near-syncope and dizziness with postural changes ie lying to sitting   Clinical Impression   Patient evaluated by Occupational Therapy with no further acute OT needs identified. All education has been completed and the patient has no further questions. See below for any follow-up Occupational Therapy or equipment needs. OT is signing off. Thank you for this referral.     Follow Up Recommendations  SNF    Equipment Recommendations       Recommendations for Other Services       Precautions / Restrictions Precautions Precautions: Fall Restrictions Weight Bearing Restrictions: No      Mobility Bed Mobility                  Transfers                      Balance                                           ADL either performed or assessed with clinical judgement   ADL                                         General ADL Comments: patient reports completing all BADLs independently after setup while at [facility] prior to admit.  patient was not agreeable to washing her face or brushing her hair this date, reporting that she has no trouble completing these tasks.      Vision Baseline Vision/History: Wears glasses Vision Assessment?: No apparent visual deficits     Perception Perception Perception Tested?: No   Praxis Praxis Praxis tested?: Not tested    Pertinent Vitals/Pain Pain Assessment: 0-10 Pain Score: 0-No pain Pain Location: B LE  Pain Descriptors / Indicators: Aching(Edema present Bil ankle) Pain Intervention(s): Limited activity within patient's tolerance;Monitored during session;Repositioned(LE elevated and instructed ankle pumps)     Hand Dominance     Extremity/Trunk Assessment Upper Extremity Assessment Upper Extremity Assessment: Overall WFL for tasks assessed   Lower Extremity Assessment Lower Extremity Assessment: Defer to PT evaluation  Communication Communication Communication: No difficulties   Cognition Arousal/Alertness: Awake/alert Behavior During Therapy: WFL for tasks assessed/performed Overall Cognitive Status: Within Functional Limits for tasks assessed                                     General Comments       Exercises General Exercises - Lower Extremity Ankle Circles/Pumps: AROM;10 reps;Seated   Shoulder Instructions      Home Living Family/patient expects to be discharged to:: Private residence Living Arrangements: Spouse/significant other Available Help at Discharge: Family Type of Home: House Home Access: Stairs to enter Technical brewer of Steps: 2 Entrance Stairs-Rails: Can reach both Home Layout: Able to live  on main level with bedroom/bathroom     Bathroom Shower/Tub: Teacher, early years/pre: Standard Bathroom Accessibility: Yes   Home Equipment: Tub bench;Walker - 2 wheels          Prior Functioning/Environment Level of Independence: Needs assistance  Gait / Transfers Assistance Needed: for three weeks pt has borrowed her sisters walker due to pain in both legs left more than right.  Her legs swelled  and have been painful ever since  ADL's / [place] Needed: needs assistance  Communication / Swallowing Assistance Needed: I  Comments: Community ambulation, driving, independent with ADL, going to OP PT for BLE weakness.         OT Problem List:        OT Treatment/Interventions:      OT Goals(Current goals can be found in the care plan section)    OT Frequency:     Barriers to D/C:            Co-evaluation              AM-PAC PT "6 Clicks" Daily Activity     Outcome Measure Help from another person eating meals?: A Little Help from another person taking care of personal grooming?: A Little Help from another person toileting, which includes using toliet, bedpan, or urinal?: A Little Help from another person bathing (including washing, rinsing, drying)?: A Little Help from another person to put on and taking off regular upper body clothing?: A Little Help from another person to put on and taking off regular lower body clothing?: A Little 6 Click Score: 18   End of Session    Activity Tolerance:   Patient left:    OT Visit Diagnosis: Muscle weakness (generalized) (M62.81)                Time: 7026-3785 OT Time Calculation (min): 10 min Charges:  OT General Charges $OT Visit: 1 Visit OT Evaluation $OT Eval Low Complexity: 1 Low G-Codes:     Vangie Bicker, [place], OTR/L [phone]   Arbutus Ped 09/23/2017, 11:09 AM

## 2017-09-24 ENCOUNTER — Encounter: Payer: Self-pay | Admitting: Internal Medicine

## 2017-09-24 NOTE — Progress Notes (Signed)
Location:   [place] Room Number: 143/P Place of Service:  SNF [phone]) Provider:  Tera Helper, MD  Patient Care Team: Fayrene Helper, MD as PCP - General Domenic Polite Aloha Gell, MD as PCP - Cardiology (Cardiology) Gala Romney Cristopher Estimable, MD as Consulting Physician (Gastroenterology)  Extended Emergency Contact Information Primary Emergency Contact: Stipes,Larry Address: [address]          [place], [place] [address] Johnnette L[place] of [place] Phone: [phone] Mobile Phone: [phone] Relation: Spouse Secondary Emergency Contact: Deon Pilling , [place] Phone: [phone] Mobile Phone: [phone] Relation: Daughter  Code Status:  Full Code Goals of care: Advanced Directive information Advanced Directives 09/24/2017  Does Patient Have a Medical Advance Directive? Yes  Type of Advance Directive (No Data)  Does patient want to make changes to medical advance directive? No - Patient declined  Would patient like information on creating a medical advance directive? No - Patient declined  Pre-existing out of facility DNR order (yellow form or pink MOST form) -     Chief Complaint  Patient presents with  . Hospitalization Follow-up    Patient is being seen for Hospitalization F.U    HPI:  Pt is a 62 y.o. female seen today for a hospital f/u s/p admission from  Past Medical History:  Diagnosis Date  . Acid reflux   . Anemia   . Arthritis   . Axillary masses    Soft tissue - status post excision  . Back pain   . Chronic kidney disease   . Diastolic heart failure ([place])   . Essential hypertension   . Ischemic heart disease    Abnormal Myoview April 2018 - medical therapy  . Mixed hyperlipidemia   . Obesity   . Pancreatitis   . Sleep apnea    Noncompliant with CPAP  . Type 2 diabetes mellitus, uncontrolled ([place])    Past Surgical History:  Procedure Laterality Date  . ABDOMINAL  HYSTERECTOMY    . AV FISTULA PLACEMENT Left 09/02/2017   Procedure: creation of left arm ARTERIOVENOUS (AV) FISTULA;  Surgeon: Serafina Mitchell, MD;  Location: [facility] OR;  Service: Vascular;  Laterality: Left;  . COLONOSCOPY  2008   Dr. Oneida Alar: normal   . COLONOSCOPY N/A 12/18/2016   Dr. Oneida Alar: multiple tubular adenomas, internal hemorrhoids. Surveillance in 3 years   . ESOPHAGEAL DILATION N/A 10/13/2015   Procedure: ESOPHAGEAL DILATION;  Surgeon: Rogene Houston, MD;  Location[facility];  Service: Endoscopy;  Laterality: N/A;  . ESOPHAGOGASTRODUODENOSCOPY N/A 10/13/2015   Dr. Laural Golden: chronic gastritis on path, no H.pylori. Empiric dilation   . ESOPHAGOGASTRODUODENOSCOPY N/A 12/18/2016   Dr. Oneida Alar: mild gastritis. BRAVO study revealed uncontrolled GERD. Dysphagia secondary to uncontrolled reflux  . FOOT SURGERY Bilateral   . LUNG BIOPSY    . MASS EXCISION Right 01/09/2013   Procedure: EXCISION OF NEOPLASM OF RIGHT  AXILLA  AND EXCISION OF NEOPLASM OF LEFT AXILLA;  Surgeon: Jamesetta So, MD;  Location: [facility] ORS;  Service: General;  Laterality: Right;  procedure end @ 08:23  . MYRINGOTOMY WITH TUBE PLACEMENT Bilateral 04/28/2017   Procedure: BILATERAL MYRINGOTOMY WITH TUBE PLACEMENT;  Surgeon: Leta Baptist, MD;  Location: [place];  Service: ENT;  Laterality: Bilateral;  . SAVORY DILATION N/A 12/18/2016   Procedure: SAVORY DILATION;  Surgeon: Danie Binder, MD;  Location: [facility];  Service:  Endoscopy;  Laterality: N/A;    Allergies  Allergen Reactions  . Ace Inhibitors Anaphylaxis and Swelling  . Penicillins Itching, Swelling and Other (See Comments)    SWELLING REACTION UNSPECIFIED   PATIENT HAS HAD A PCN REACTION WITH IMMEDIATE RASH, FACIAL/TONGUE/THROAT SWELLING, SOB, OR LIGHTHEADEDNESS WITH HYPOTENSION:  #  #  YES  #  #  Has patient had a PCN reaction causing severe rash involving mucus membranes or skin necrosis: No Has patient had a PCN reaction that required hospitalization No Has  patient had a PCN reaction occurring within the last 10 years: No If all of the above answers are "NO", then may proceed with Cephalosporin use.   . Statins Other (See Comments)    elevated LFT's  . Albuterol Swelling    SWELLING REACTION UNSPECIFIED     Allergies as of 09/24/2017      Reactions   Ace Inhibitors Anaphylaxis, Swelling   Penicillins Itching, Swelling, Other (See Comments)   SWELLING REACTION UNSPECIFIED  PATIENT HAS HAD A PCN REACTION WITH IMMEDIATE RASH, FACIAL/TONGUE/THROAT SWELLING, SOB, OR LIGHTHEADEDNESS WITH HYPOTENSION:  #  #  YES  #  #  Has patient had a PCN reaction causing severe rash involving mucus membranes or skin necrosis: No Has patient had a PCN reaction that required hospitalization No Has patient had a PCN reaction occurring within the last 10 years: No If all of the above answers are "NO", then may proceed with Cephalosporin use.   Statins Other (See Comments)   elevated LFT's   Albuterol Swelling   SWELLING REACTION UNSPECIFIED       Medication List        Accurate as of 09/24/17  9:27 AM. Always use your most recent med list.          aspirin 81 MG EC tablet Take 1 tablet (81 mg total) by mouth daily. With Food   azelastine 0.1 % nasal spray Commonly known as:  ASTELIN Place 2 sprays into both nostrils 2 (two) times daily. Use in each nostril as directed   BENTYL 10 MG capsule Generic drug:  dicyclomine Take 10 mg by mouth 4 (four) times daily as needed for spasms.   calcitRIOL 0.25 MCG capsule Commonly known as:  ROCALTROL Take 0.25 mcg by mouth every Monday, Wednesday, and Friday.   calcium carbonate (dosed in mg elemental calcium) 1250 MG/5ML Susp Take 5 mLs (500 mg of elemental calcium total) by mouth every 6 (six) hours as needed for indigestion.   camphor-menthol lotion Commonly known as:  SARNA Apply 1 application topically every 8 (eight) hours as needed for itching.   cloNIDine 0.1 MG tablet Commonly known as:   CATAPRES TAKE 1 TABLET BY MOUTH  EVERY EVENING AT 9PM FOR  BLOOD PRESSURE   dexlansoprazole 60 MG capsule Commonly known as:  DEXILANT 1 PO EVERY MORNING WITH BREAKFAST.   dextromethorphan-guaiFENesin 30-600 MG 12hr tablet Commonly known as:  MUCINEX DM Take 1 tablet by mouth 2 (two) times daily.   ezetimibe 10 MG tablet Commonly known as:  ZETIA Take 1 tablet (10 mg total) by mouth daily.   gabapentin 100 MG capsule Commonly known as:  NEURONTIN Take 1 capsule (100 mg total) by mouth 2 (two) times daily.   hydrOXYzine 25 MG tablet Commonly known as:  ATARAX/VISTARIL Take 1 tablet (25 mg total) by mouth every 8 (eight) hours as needed for itching.   insulin detemir 100 UNIT/ML injection Commonly known as:  LEVEMIR Inject 0.55 mLs (55  Units total) into the skin 2 (two) times daily.   isosorbide mononitrate 30 MG 24 hr tablet Commonly known as:  IMDUR Take 1 tablet (30 mg total) by mouth daily.   NOVOLOG 100 UNIT/ML injection Generic drug:  insulin aspart Inject 8 Units into the skin 3 (three) times daily with meals.   ondansetron 4 MG disintegrating tablet Commonly known as:  ZOFRAN ODT Take 1 tablet (4 mg total) by mouth every 8 (eight) hours as needed for nausea or vomiting.   senna-docusate 8.6-50 MG tablet Commonly known as:  Senokot-S Take 2 tablets by mouth at bedtime as needed for mild constipation or moderate constipation.   sertraline 25 MG tablet Commonly known as:  ZOLOFT Take 25 mg by mouth daily.   sevelamer carbonate 800 MG tablet Commonly known as:  RENVELA Take 1 tablet (800 mg total) by mouth 3 (three) times daily with meals.   torsemide 20 MG tablet Commonly known as:  DEMADEX Take 2 tablets (40 mg total) by mouth daily as needed (edema or weight gain).   traMADol 50 MG tablet Commonly known as:  ULTRAM Take 25 mg by mouth every 6 (six) hours as needed for moderate pain or severe pain.   Vitamin D (Ergocalciferol) 50000 units Caps  capsule Commonly known as:  DRISDOL Take 1 capsule (50,000 Units total) by mouth every 7 (seven) days.       Review of Systems  Immunization History  Administered Date(s) Administered  . H1N1 01/16/2008  . Influenza Whole 12/28/2004, 02/24/2006, 12/07/2006, 02/01/2008, 01/28/2009, 11/25/2010  . Influenza,inj,Quad PF,6+ Mos 12/22/2012, 03/12/2015, 01/14/2016, 11/24/2016  . Pneumococcal Polysaccharide-23 01/28/2009, 07/25/2015  . Td 11/12/2003  . Tdap 10/02/2014  . Zoster Recombinat (Shingrix) 12/24/2016   Pertinent  Health Maintenance Due  Topic Date Due  . FOOT EXAM  08/29/2017  . OPHTHALMOLOGY EXAM  10/09/2017 (Originally 01/12/2017)  . URINE MICROALBUMIN  10/09/2017 (Originally 02/27/2017)  . PAP SMEAR  01/28/2023 (Originally 09/18/2016)  . INFLUENZA VACCINE  10/21/2017  . HEMOGLOBIN A1C  02/18/2018  . MAMMOGRAM  12/17/2018  . COLONOSCOPY  12/19/2026   Fall Risk  07/08/2017 11/24/2016 06/17/2016 06/04/2016 03/02/2016  Falls in the past year? Yes Yes No No No  Number falls in past yr: 2 or more 2 or more - - -  Injury with Fall? - No - - -  Follow up - - - - -   Functional Status Survey:    Vitals:   09/24/17 0854  BP: (!) 144/68  Pulse: 75  Resp: 20  Temp: 98.2 F (36.8 C)  TempSrc: Oral  Weight: 182 lb 12.8 oz (82.9 kg)   Body mass index is 31.38 kg/m. Physical Exam  Labs reviewed: Recent Labs    09/20/17 0450 09/21/17 0451 09/22/17 0436 09/23/17 0451  NA 142 143 147* 145  K 3.9 3.7 4.1 4.3  CL 104 106 111 111  CO2 23 23 25 25   GLUCOSE 210* 257* 180* 174*  BUN 116* 95* 85* 71*  CREATININE 6.45* 4.99* 4.30* 3.95*  CALCIUM 9.8 9.7 10.1 9.8  MG 1.8 1.6* 1.6* 1.6*  PHOS 6.3* 4.6 3.7  --    Recent Labs    08/15/17 1018 08/31/17 1343 09/13/17 0710  09/20/17 0450 09/21/17 0451 09/22/17 0436  AST 17 16 34  --   --   --   --   ALT 23 18 38  --   --   --   --   ALKPHOS 98 94 160*  --   --   --   --  BILITOT 0.6 0.5 0.4  --   --   --   --   PROT  6.6 6.3* 8.4*  --   --   --   --   ALBUMIN 2.5* 2.0* 2.8*   < > 2.9* 2.7* 2.6*   < > = values in this interval not displayed.   Recent Labs    08/18/17 1210  08/31/17 2330  09/16/17 1503  09/20/17 0450 09/21/17 0451 09/23/17 0451  WBC 8.9   < > 13.8*   < > 10.6*   < > 9.8 10.8* 9.6  NEUTROABS 7.1  --  9.5*  --  6.5  --   --   --   --   HGB 8.4*   < > 6.8*   < > 9.3*   < > 9.3* 9.0* 8.5*  HCT 28.2*   < > 23.0*   < > 30.6*   < > 30.6* 30.1* 29.1*  MCV 73.1*   < > 73.2*   < > 75.9*   < > 75.6* 75.1* 76.0*  PLT 469*   < > 363   < > 555*   < > 476* 459* 381   < > = values in this interval not displayed.   Lab Results  Component Value Date   TSH 4.16 12/24/2016   Lab Results  Component Value Date   HGBA1C 11.0 (H) 08/18/2017   Lab Results  Component Value Date   CHOL 234 (H) 02/25/2017   HDL 38 (L) 02/25/2017   [place]  02/25/2017     Comment:     . LDL cholesterol not calculated. Triglyceride levels greater than 400 mg/dL invalidate calculated LDL results. . Reference range: <100 . Desirable range <100 mg/dL for primary prevention;   <70 mg/dL for patients with CHD or diabetic patients  with > or = 2 CHD risk factors. Marland Kitchen LDL-C is now calculated using the Martin-Hopkins  calculation, which is a validated novel method providing  better accuracy than the Friedewald equation in the  estimation of LDL-C.  Cresenciano Genre et al. Annamaria Helling. 3536;144(31): 2061-2068  (http://education.[facility].com/faq/FAQ164)    LDLDIRECT 107 05/19/2016   TRIG 414 (H) 03/24/2017   CHOLHDL 6.2 (H) 02/25/2017    Significant Diagnostic Results in last 30 days:  Dg Chest 1 View  Result Date: 09/19/2017 CLINICAL DATA:  Recent syncopal episode EXAM: CHEST  1 VIEW COMPARISON:  09/16/2017 FINDINGS: Cardiac shadow is stable. The lungs are well aerated bilaterally. No focal infiltrate or sizable effusion is seen. No acute bony abnormality is noted. IMPRESSION: No active disease. Electronically Signed    By: Inez Catalina M.D.   On: 09/19/2017 15:33   Ct Head Wo Contrast  Result Date: 09/19/2017 CLINICAL DATA:  Syncopal episode during bowel movement EXAM: CT HEAD WITHOUT CONTRAST TECHNIQUE: Contiguous axial images were obtained from the base of the skull through the vertex without intravenous contrast. COMPARISON:  05/28/2011 FINDINGS: Brain: No evidence of acute infarction, hemorrhage, hydrocephalus, extra-axial collection or mass lesion/mass effect. Vascular: No hyperdense vessel or unexpected calcification. Skull: Normal. Negative for fracture or focal lesion. Sinuses/Orbits: No acute finding. Other: None. IMPRESSION: Normal head CT for age Electronically Signed   By: Inez Catalina M.D.   On: 09/19/2017 15:31    Assessment/Plan There are no diagnoses linked to this encounter.   Family/ staff Communication:   Labs/tests ordered:       This encounter was created in error - please disregard.

## 2017-09-27 ENCOUNTER — Encounter: Payer: Self-pay | Admitting: Internal Medicine

## 2017-09-27 ENCOUNTER — Other Ambulatory Visit (HOSPITAL_COMMUNITY)
Admission: RE | Admit: 2017-09-27 | Discharge: 2017-09-27 | Disposition: A | Discharge status: Home / Self Care | Payer: 59 | Source: Skilled Nursing Facility | Attending: Internal Medicine | Admitting: Internal Medicine

## 2017-09-27 DIAGNOSIS — N189 Chronic kidney disease, unspecified: Secondary | ICD-10-CM | POA: Insufficient documentation

## 2017-09-27 DIAGNOSIS — K861 Other chronic pancreatitis: Secondary | ICD-10-CM | POA: Insufficient documentation

## 2017-09-27 DIAGNOSIS — K219 Gastro-esophageal reflux disease without esophagitis: Secondary | ICD-10-CM | POA: Insufficient documentation

## 2017-09-27 DIAGNOSIS — I5032 Chronic diastolic (congestive) heart failure: Secondary | ICD-10-CM | POA: Insufficient documentation

## 2017-09-27 DIAGNOSIS — M1990 Unspecified osteoarthritis, unspecified site: Secondary | ICD-10-CM | POA: Insufficient documentation

## 2017-09-27 LAB — CBC WITH DIFFERENTIAL/PLATELET
BASOS PCT: 0 %
Basophils Absolute: 0 10*3/uL (ref 0.0–0.1)
EOS ABS: 0.2 10*3/uL (ref 0.0–0.7)
Eosinophils Relative: 2 %
HEMATOCRIT: 29.1 % — AB (ref 36.0–46.0)
Hemoglobin: 8.6 g/dL — ABNORMAL LOW (ref 12.0–15.0)
Lymphocytes Relative: 36 %
Lymphs Abs: 3.2 10*3/uL (ref 0.7–4.0)
MCH: 22.1 pg — ABNORMAL LOW (ref 26.0–34.0)
MCHC: 29.6 g/dL — AB (ref 30.0–36.0)
MCV: 74.8 fL — ABNORMAL LOW (ref 78.0–100.0)
MONO ABS: 0.8 10*3/uL (ref 0.1–1.0)
Monocytes Relative: 9 %
NEUTROS ABS: 4.7 10*3/uL (ref 1.7–7.7)
Neutrophils Relative %: 53 %
PLATELETS: 409 10*3/uL — AB (ref 150–400)
RBC: 3.89 MIL/uL (ref 3.87–5.11)
RDW: 16.9 % — AB (ref 11.5–15.5)
WBC: 8.9 10*3/uL (ref 4.0–10.5)

## 2017-09-27 LAB — BASIC METABOLIC PANEL
Anion gap: 9 (ref 5–15)
BUN: 54 mg/dL — ABNORMAL HIGH (ref 8–23)
CO2: 26 mmol/L (ref 22–32)
CREATININE: 3.17 mg/dL — AB (ref 0.44–1.00)
Calcium: 9.7 mg/dL (ref 8.9–10.3)
Chloride: 111 mmol/L (ref 98–111)
GFR calc non Af Amer: 15 mL/min — ABNORMAL LOW (ref >60)
GFR, EST AFRICAN AMERICAN: 17 mL/min — AB (ref >60)
Glucose, Bld: 124 mg/dL — ABNORMAL HIGH (ref 70–99)
Potassium: 4 mmol/L (ref 3.5–5.1)
SODIUM: 146 mmol/L — AB (ref 135–145)

## 2017-09-27 LAB — MAGNESIUM: Magnesium: 1.9 mg/dL (ref 1.7–2.4)

## 2017-09-27 NOTE — Progress Notes (Signed)
Provider:  Veleta Miners Location:   [place] Room Number: 143/P Place of Service:  SNF (31)  PCP: Fayrene Helper, MD Patient Care Team: Fayrene Helper, MD as PCP - General Domenic Polite Aloha Gell, MD as PCP - Cardiology (Cardiology) Gala Romney Cristopher Estimable, MD as Consulting Physician (Gastroenterology)  Extended Emergency Contact Information Primary Emergency Contact: Rosello,Larry Address: [address]          [place], [place] [address] [place] Phone: [phone] Mobile Phone: [phone] Relation: Spouse Secondary Emergency Contact: Deon Pilling , [place] Phone: [phone] Mobile Phone: [phone] Relation: Daughter  Code Status: Full Code Goals of Care: Advanced Directive information Advanced Directives 09/27/2017  Does Patient Have a Medical Advance Directive? Yes  Type of Advance Directive (No Data)  Does patient want to make changes to medical advance directive? No - Patient declined  Would patient like information on creating a medical advance directive? No - Patient declined  Pre-existing out of facility DNR order (yellow form or pink MOST form) -      Chief Complaint  Patient presents with  . Readmit To SNF    Patient is being seen for Readmission Visit  . Acute Visit    Patient c/o Ankle Pain    HPI: Patient is a 62 y.o. female seen today for admission to  Past Medical History:  Diagnosis Date  . Acid reflux   . Anemia   . Arthritis   . Axillary masses    Soft tissue - status post excision  . Back pain   . Chronic kidney disease   . Diastolic heart failure ([place])   . Essential hypertension   . Ischemic heart disease    Abnormal Myoview April 2018 - medical therapy  . Mixed hyperlipidemia   . Obesity   . Pancreatitis   . Sleep apnea    Noncompliant with CPAP  . Type 2 diabetes mellitus, uncontrolled ([place])    Past Surgical History:  Procedure Laterality Date    . ABDOMINAL HYSTERECTOMY    . AV FISTULA PLACEMENT Left 09/02/2017   Procedure: creation of left arm ARTERIOVENOUS (AV) FISTULA;  Surgeon: Serafina Mitchell, MD;  Location: [facility] OR;  Service: Vascular;  Laterality: Left;  . COLONOSCOPY  2008   Dr. Oneida Alar: normal   . COLONOSCOPY N/A 12/18/2016   Dr. Oneida Alar: multiple tubular adenomas, internal hemorrhoids. Surveillance in 3 years   . ESOPHAGEAL DILATION N/A 10/13/2015   Procedure: ESOPHAGEAL DILATION;  Surgeon: Rogene Houston, MD;  Location: [facility];  Service: Endoscopy;  Laterality: N/A;  . ESOPHAGOGASTRODUODENOSCOPY N/A 10/13/2015   Dr. Laural Golden: chronic gastritis on path, no H.pylori. Empiric dilation   . ESOPHAGOGASTRODUODENOSCOPY N/A 12/18/2016   Dr. Oneida Alar: mild gastritis. BRAVO study revealed uncontrolled GERD. Dysphagia secondary to uncontrolled reflux  . FOOT SURGERY Bilateral   . LUNG BIOPSY    . MASS EXCISION Right 01/09/2013   Procedure: EXCISION OF NEOPLASM OF RIGHT  AXILLA  AND EXCISION OF NEOPLASM OF LEFT AXILLA;  Surgeon: Jamesetta So, MD;  Location: [facility] ORS;  Service: General;  Laterality: Right;  procedure end @ 08:23  . MYRINGOTOMY WITH TUBE PLACEMENT Bilateral 04/28/2017   Procedure: BILATERAL MYRINGOTOMY WITH TUBE PLACEMENT;  Surgeon: Leta Baptist, MD;  Location: [place];  Service: ENT;  Laterality: Bilateral;  . SAVORY DILATION N/A 12/18/2016   Procedure: SAVORY DILATION;  Surgeon: Danie Binder,  MD;  Location: [facility];  Service: Endoscopy;  Laterality: N/A;    reports that she has never smoked. She has never used smokeless tobacco. She reports that she does not drink alcohol or use drugs. Social History   Socioeconomic History  . Marital status: Married    Spouse name: Not on file  . Number of children: Not on file  . Years of education: Not on file  . Highest education level: Not on file  Occupational History  . Not on file  Social Needs  . Financial resource strain: Not on file  . Food insecurity:    Worry:  Not on file    Inability: Not on file  . Transportation needs:    Medical: Not on file    Non-medical: Not on file  Tobacco Use  . Smoking status: Never Smoker  . Smokeless tobacco: Never Used  Substance and Sexual Activity  . Alcohol use: No  . Drug use: No  . Sexual activity: Not Currently  Lifestyle  . Physical activity:    Days per week: Not on file    Minutes per session: Not on file  . Stress: Not on file  Relationships  . Social connections:    Talks on phone: Not on file    Gets together: Not on file    Attends religious service: Not on file    Active member of club or organization: Not on file    Attends meetings of clubs or organizations: Not on file    Relationship status: Not on file  . Intimate partner violence:    Fear of current or ex partner: Not on file    Emotionally abused: Not on file    Physically abused: Not on file    Forced sexual activity: Not on file  Other Topics Concern  . Not on file  Social History Narrative  . Not on file    Functional Status Survey:    Family History  Problem Relation Age of Onset  . Hypertension Father   . Hypercholesterolemia Father   . Arthritis Father   . Hypertension Sister   . Hypercholesterolemia Sister   . Breast cancer Sister   . Hypertension Sister   . Colon cancer Neg Hx   . Colon polyps Neg Hx     Health Maintenance  Topic Date Due  . FOOT EXAM  08/29/2017  . OPHTHALMOLOGY EXAM  10/09/2017 (Originally 01/12/2017)  . URINE MICROALBUMIN  10/09/2017 (Originally 02/27/2017)  . PAP SMEAR  01/28/2023 (Originally 09/18/2016)  . INFLUENZA VACCINE  10/21/2017  . HEMOGLOBIN A1C  02/18/2018  . MAMMOGRAM  12/17/2018  . TETANUS/TDAP  10/01/2024  . COLONOSCOPY  12/19/2026  . PNEUMOCOCCAL POLYSACCHARIDE VACCINE  Completed  . Hepatitis C Screening  Completed  . HIV Screening  Completed    Allergies  Allergen Reactions  . Ace Inhibitors Anaphylaxis and Swelling  . Penicillins Itching, Swelling and Other  (See Comments)    SWELLING REACTION UNSPECIFIED   PATIENT HAS HAD A PCN REACTION WITH IMMEDIATE RASH, FACIAL/TONGUE/THROAT SWELLING, SOB, OR LIGHTHEADEDNESS WITH HYPOTENSION:  #  #  YES  #  #  Has patient had a PCN reaction causing severe rash involving mucus membranes or skin necrosis: No Has patient had a PCN reaction that required hospitalization No Has patient had a PCN reaction occurring within the last 10 years: No If all of the above answers are "NO", then may proceed with Cephalosporin use.   . Statins Other (See Comments)  elevated LFT's  . Albuterol Swelling    SWELLING REACTION UNSPECIFIED     Allergies as of 09/27/2017      Reactions   Ace Inhibitors Anaphylaxis, Swelling   Penicillins Itching, Swelling, Other (See Comments)   SWELLING REACTION UNSPECIFIED  PATIENT HAS HAD A PCN REACTION WITH IMMEDIATE RASH, FACIAL/TONGUE/THROAT SWELLING, SOB, OR LIGHTHEADEDNESS WITH HYPOTENSION:  #  #  YES  #  #  Has patient had a PCN reaction causing severe rash involving mucus membranes or skin necrosis: No Has patient had a PCN reaction that required hospitalization No Has patient had a PCN reaction occurring within the last 10 years: No If all of the above answers are "NO", then may proceed with Cephalosporin use.   Statins Other (See Comments)   elevated LFT's   Albuterol Swelling   SWELLING REACTION UNSPECIFIED       Medication List        Accurate as of 09/27/17  9:38 AM. Always use your most recent med list.          aspirin 81 MG EC tablet Take 1 tablet (81 mg total) by mouth daily. With Food   azelastine 0.1 % nasal spray Commonly known as:  ASTELIN Place 2 sprays into both nostrils 2 (two) times daily. Use in each nostril as directed   BENTYL 10 MG capsule Generic drug:  dicyclomine Take 10 mg by mouth 4 (four) times daily as needed for spasms.   calcitRIOL 0.25 MCG capsule Commonly known as:  ROCALTROL Take 0.25 mcg by mouth every Monday, Wednesday, and  Friday.   calcium carbonate (dosed in mg elemental calcium) 1250 MG/5ML Susp Take 5 mLs (500 mg of elemental calcium total) by mouth every 6 (six) hours as needed for indigestion.   camphor-menthol lotion Commonly known as:  SARNA Apply 1 application topically every 8 (eight) hours as needed for itching.   cloNIDine 0.1 MG tablet Commonly known as:  CATAPRES TAKE 1 TABLET BY MOUTH  EVERY EVENING AT 9PM FOR  BLOOD PRESSURE   dexlansoprazole 60 MG capsule Commonly known as:  DEXILANT 1 PO EVERY MORNING WITH BREAKFAST.   dextromethorphan-guaiFENesin 30-600 MG 12hr tablet Commonly known as:  MUCINEX DM Take 1 tablet by mouth 2 (two) times daily.   ezetimibe 10 MG tablet Commonly known as:  ZETIA Take 1 tablet (10 mg total) by mouth daily.   gabapentin 100 MG capsule Commonly known as:  NEURONTIN Take 1 capsule (100 mg total) by mouth 2 (two) times daily.   hydrOXYzine 25 MG tablet Commonly known as:  ATARAX/VISTARIL Take 1 tablet (25 mg total) by mouth every 8 (eight) hours as needed for itching.   insulin detemir 100 UNIT/ML injection Commonly known as:  LEVEMIR Inject 0.55 mLs (55 Units total) into the skin 2 (two) times daily.   isosorbide mononitrate 30 MG 24 hr tablet Commonly known as:  IMDUR Take 1 tablet (30 mg total) by mouth daily.   NOVOLOG 100 UNIT/ML injection Generic drug:  insulin aspart Inject 8 Units into the skin 3 (three) times daily with meals.   ondansetron 4 MG disintegrating tablet Commonly known as:  ZOFRAN ODT Take 1 tablet (4 mg total) by mouth every 8 (eight) hours as needed for nausea or vomiting.   senna-docusate 8.6-50 MG tablet Commonly known as:  Senokot-S Take 2 tablets by mouth at bedtime as needed for mild constipation or moderate constipation.   sertraline 25 MG tablet Commonly known as:  ZOLOFT Take 25 mg  by mouth daily.   sevelamer carbonate 800 MG tablet Commonly known as:  RENVELA Take 1 tablet (800 mg total) by mouth 3  (three) times daily with meals.   torsemide 20 MG tablet Commonly known as:  DEMADEX Take 2 tablets (40 mg total) by mouth daily as needed (edema or weight gain).   traMADol 50 MG tablet Commonly known as:  ULTRAM Take 25 mg by mouth every 6 (six) hours as needed for moderate pain or severe pain.   Vitamin D (Ergocalciferol) 50000 units Caps capsule Commonly known as:  DRISDOL Take 1 capsule (50,000 Units total) by mouth every 7 (seven) days.       Review of Systems  Vitals:   09/27/17 0928  BP: (!) 144/75  Pulse: 73  Resp: 18  Temp: 98.2 F (36.8 C)  TempSrc: Oral   There is no height or weight on file to calculate BMI. Physical Exam  Labs reviewed: Basic Metabolic Panel: Recent Labs    09/20/17 0450 09/21/17 0451 09/22/17 0436 09/23/17 0451 09/27/17 0833  NA 142 143 147* 145 146*  K 3.9 3.7 4.1 4.3 4.0  CL 104 106 111 111 111  CO2 23 23 25 25 26   GLUCOSE 210* 257* 180* 174* 124*  BUN 116* 95* 85* 71* 54*  CREATININE 6.45* 4.99* 4.30* 3.95* 3.17*  CALCIUM 9.8 9.7 10.1 9.8 9.7  MG 1.8 1.6* 1.6* 1.6* 1.9  PHOS 6.3* 4.6 3.7  --   --    Liver Function Tests: Recent Labs    08/15/17 1018 08/31/17 1343 09/13/17 0710  09/20/17 0450 09/21/17 0451 09/22/17 0436  AST 17 16 34  --   --   --   --   ALT 23 18 38  --   --   --   --   ALKPHOS 98 94 160*  --   --   --   --   BILITOT 0.6 0.5 0.4  --   --   --   --   PROT 6.6 6.3* 8.4*  --   --   --   --   ALBUMIN 2.5* 2.0* 2.8*   < > 2.9* 2.7* 2.6*   < > = values in this interval not displayed.   Recent Labs    02/15/17 1031 03/24/17 1027 03/25/17 0408 03/31/17 0845  LIPASE 163* 107* 79* 143*  AMYLASE 82  --   --   --    No results for input(s): AMMONIA in the last 8760 hours. CBC: Recent Labs    08/31/17 2330  09/16/17 1503  09/21/17 0451 09/23/17 0451 09/27/17 0833  WBC 13.8*   < > 10.6*   < > 10.8* 9.6 8.9  NEUTROABS 9.5*  --  6.5  --   --   --  PENDING  HGB 6.8*   < > 9.3*   < > 9.0* 8.5*  8.6*  HCT 23.0*   < > 30.6*   < > 30.1* 29.1* 29.1*  MCV 73.2*   < > 75.9*   < > 75.1* 76.0* 74.8*  PLT 363   < > 555*   < > 459* 381 409*   < > = values in this interval not displayed.   Cardiac Enzymes: Recent Labs    08/18/17 1210 09/16/17 1503 09/19/17 1438  TROPONINI <0.03 <0.03 <0.03   BNP: Invalid input(s): POCBNP Lab Results  Component Value Date   HGBA1C 11.0 (H) 08/18/2017   Lab Results  Component Value Date  TSH 4.16 12/24/2016   Lab Results  Component Value Date   VITAMINB12 528 05/04/2014   Lab Results  Component Value Date   FOLATE 12.1 05/04/2014   Lab Results  Component Value Date   IRON 7 (L) 09/01/2017   TIBC 158 (L) 09/01/2017   FERRITIN 288 09/01/2017    Imaging and Procedures obtained prior to SNF admission: No results found.  Assessment/Plan There are no diagnoses linked to this encounter.   Family/ staff Communication:   Labs/tests ordered:   This encounter was created in error - please disregard.

## 2017-09-28 ENCOUNTER — Encounter (HOSPITAL_COMMUNITY)
Admission: RE | Admit: 2017-09-28 | Discharge: 2017-09-28 | Disposition: A | Discharge status: Home / Self Care | Payer: 59 | Source: Skilled Nursing Facility | Attending: Internal Medicine | Admitting: Internal Medicine

## 2017-09-28 DIAGNOSIS — K219 Gastro-esophageal reflux disease without esophagitis: Secondary | ICD-10-CM | POA: Insufficient documentation

## 2017-09-28 DIAGNOSIS — K861 Other chronic pancreatitis: Secondary | ICD-10-CM | POA: Insufficient documentation

## 2017-09-28 DIAGNOSIS — I951 Orthostatic hypotension: Secondary | ICD-10-CM | POA: Insufficient documentation

## 2017-09-28 DIAGNOSIS — N189 Chronic kidney disease, unspecified: Secondary | ICD-10-CM | POA: Insufficient documentation

## 2017-09-28 DIAGNOSIS — I5032 Chronic diastolic (congestive) heart failure: Secondary | ICD-10-CM | POA: Insufficient documentation

## 2017-09-29 LAB — VITAMIN D 25 HYDROXY (VIT D DEFICIENCY, FRACTURES): VIT D 25 HYDROXY: 30.9 ng/mL (ref 30.0–100.0)

## 2017-09-30 ENCOUNTER — Ambulatory Visit: Payer: Self-pay | Admitting: Internal Medicine

## 2017-09-30 ENCOUNTER — Encounter: Payer: Self-pay | Admitting: Internal Medicine

## 2017-09-30 ENCOUNTER — Non-Acute Institutional Stay (SKILLED_NURSING_FACILITY): Payer: 59 | Admitting: Internal Medicine

## 2017-09-30 DIAGNOSIS — Z87898 Personal history of other specified conditions: Secondary | ICD-10-CM

## 2017-09-30 DIAGNOSIS — I10 Essential (primary) hypertension: Secondary | ICD-10-CM | POA: Diagnosis not present

## 2017-09-30 DIAGNOSIS — E1121 Type 2 diabetes mellitus with diabetic nephropathy: Secondary | ICD-10-CM | POA: Diagnosis not present

## 2017-09-30 DIAGNOSIS — N185 Chronic kidney disease, stage 5: Secondary | ICD-10-CM | POA: Diagnosis not present

## 2017-09-30 DIAGNOSIS — I5032 Chronic diastolic (congestive) heart failure: Secondary | ICD-10-CM

## 2017-09-30 DIAGNOSIS — E1165 Type 2 diabetes mellitus with hyperglycemia: Secondary | ICD-10-CM

## 2017-09-30 DIAGNOSIS — IMO0002 Reserved for concepts with insufficient information to code with codable children: Secondary | ICD-10-CM

## 2017-09-30 NOTE — Progress Notes (Signed)
Location:    [place] Room Number: 143/P Place of Service:  SNF [phone])  Provider: Granville Lewis PA-C  PCP: Fayrene Helper, MD Patient Care Team: Fayrene Helper, MD as PCP - General Satira Sark, MD as PCP - Cardiology (Cardiology) Gala Romney Cristopher Estimable, MD as Consulting Physician (Gastroenterology)  Extended Emergency Contact Information Primary Emergency Contact: Devera,Larry Address: [address]          [place], [place] [address] [place] Phone: [phone] Mobile Phone: [phone] Relation: Spouse Secondary Emergency Contact: Deon Pilling , [place] Phone: [phone] Mobile Phone: [phone] Relation: Daughter  Code Status: Full Code Goals of care:  Advanced Directive information Advanced Directives 09/30/2017  Does Patient Have a Medical Advance Directive? Yes  Type of Advance Directive (No Data)  Does patient want to make changes to medical advance directive? No - Patient declined  Would patient like information on creating a medical advance directive? No - Patient declined  Pre-existing out of facility DNR order (yellow form or pink MOST form) -     Allergies  Allergen Reactions  . Ace Inhibitors Anaphylaxis and Swelling  . Penicillins Itching, Swelling and Other (See Comments)    SWELLING REACTION UNSPECIFIED   PATIENT HAS HAD A PCN REACTION WITH IMMEDIATE RASH, FACIAL/TONGUE/THROAT SWELLING, SOB, OR LIGHTHEADEDNESS WITH HYPOTENSION:  #  #  YES  #  #  Has patient had a PCN reaction causing severe rash involving mucus membranes or skin necrosis: No Has patient had a PCN reaction that required hospitalization No Has patient had a PCN reaction occurring within the last 10 years: No If all of the above answers are "NO", then may proceed with Cephalosporin use.   . Statins Other (See Comments)    elevated LFT's  . Albuterol Swelling    SWELLING REACTION UNSPECIFIED      Chief Complaint  Patient presents with  . Discharge Note    Patient is being seen for Discharge Visit    HPI:  62 y.o. female seen today for discharge scheduled for tomorrow   She has a complex medical history but lately appears to be stabilized.  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  During her initial hospitalization she was hospitalized for diastolic CHF exacerbation.  She underwent IV diuresis and was maintained on Demadex.  She did have syncopal episodes however in skilled nursing-with suspected orthostatic hypotension.  Her diuretics were reduced but she had another syncopal episode and was readmitted to the hospital  She received IV fluids diuretics were held with recommendation to restart Demadex 40 mg if her weight gain was greater than 3 pounds- this has not really occurred.   Her kidney function also improved somewhat continues to improve with a creatinine 3.17 BUN before on lab done July 8  During her hospitalization her labetalol also was held she was continued on clonidine 0.1 mg at night for hypertension as well as Imdur  Clinically she is been stable since her discharge and appears to be getting stronger- there is been no further syncopal episodes.  She also is a diabetic on Levemir 55 units twice a day as well as NovoLog 8 units at lunch and supper if her CBG is greater than 150    Blood sugars in the morning appear to be more in the mid  100s- later in the day more variability at noon averaging more in the higher 100s- at 530 quite a bit of variability ranging from 72 up to 237- at bedtime blood sugars are usually in the higher 100s averaging  She also has a history of hypercalcemia and is on calcium supplementation-as well as Calcitrol--calcium level was 9.7 on July 8 lab  She also has a history of anemia of chronic disease hemoglobin is relatively stable at 8.6 on  lab done earlier this week as well.  She does have nephrology follow-up scheduled as well as endocrinology-apparently canceled her endocrinology appointment today but will reschedule this.  She does have a home from family who is very supportive she will need continued PT OT for strengthening as well as nursing support for her numerous medical issues CNA support to help with activities of daily living- she will need a transferred to a bench in wheelchair secondary to some continued weakness and difficulties ambulating.  She has no complaints says she is feeling significantly better and looking forward to going home--manual blood pressure today was 170/8 appearsat times she does have some elevated systolics in the 025+ area-again we have been concerned about hypotension.  In the past with syncope       Past Medical History:  Diagnosis Date  . Acid reflux   . Anemia   . Arthritis   . Axillary masses    Soft tissue - status post excision  . Back pain   . Chronic kidney disease   . Diastolic heart failure ([place])   . Essential hypertension   . Ischemic heart disease    Abnormal Myoview April 2018 - medical therapy  . Mixed hyperlipidemia   . Obesity   . Pancreatitis   . Sleep apnea    Noncompliant with CPAP  . Type 2 diabetes mellitus, uncontrolled ([place])     Past Surgical History:  Procedure Laterality Date  . ABDOMINAL HYSTERECTOMY    . AV FISTULA PLACEMENT Left 09/02/2017   Procedure: creation of left arm ARTERIOVENOUS (AV) FISTULA;  Surgeon: Serafina Mitchell, MD;  Location: [facility] OR;  Service: Vascular;  Laterality: Left;  . COLONOSCOPY  2008   Dr. Oneida Alar: normal   . COLONOSCOPY N/A 12/18/2016   Dr. Oneida Alar: multiple tubular adenomas, internal hemorrhoids. Surveillance in 3 years   . ESOPHAGEAL DILATION N/A 10/13/2015   Procedure: ESOPHAGEAL DILATION;  Surgeon: Rogene Houston, MD;  Location: [facility];  Service: Endoscopy;  Laterality: N/A;  . ESOPHAGOGASTRODUODENOSCOPY N/A  10/13/2015   Dr. Laural Golden: chronic gastritis on path, no H.pylori. Empiric dilation   . ESOPHAGOGASTRODUODENOSCOPY N/A 12/18/2016   Dr. Oneida Alar: mild gastritis. BRAVO study revealed uncontrolled GERD. Dysphagia secondary to uncontrolled reflux  . FOOT SURGERY Bilateral   . LUNG BIOPSY    . MASS EXCISION Right 01/09/2013   Procedure: EXCISION OF NEOPLASM OF RIGHT  AXILLA  AND EXCISION OF NEOPLASM OF LEFT AXILLA;  Surgeon: Jamesetta So, MD;  Location: [facility] ORS;  Service: General;  Laterality: Right;  procedure end @ 08:23  . MYRINGOTOMY WITH TUBE PLACEMENT Bilateral 04/28/2017   Procedure: BILATERAL MYRINGOTOMY WITH TUBE PLACEMENT;  Surgeon: Leta Baptist, MD;  Location: [place];  Service: ENT;  Laterality: Bilateral;  . SAVORY DILATION N/A 12/18/2016   Procedure: SAVORY DILATION;  Surgeon: Danie Binder, MD;  Location: [facility];  Service: Endoscopy;  Laterality: N/A;      reports that she has never smoked. She has never used smokeless tobacco. She  reports that she does not drink alcohol or use drugs. Social History   Socioeconomic History  . Marital status: Married    Spouse name: Not on file  . Number of children: Not on file  . Years of education: Not on file  . Highest education level: Not on file  Occupational History  . Not on file  Social Needs  . Financial resource strain: Not on file  . Food insecurity:    Worry: Not on file    Inability: Not on file  . Transportation needs:    Medical: Not on file    Non-medical: Not on file  Tobacco Use  . Smoking status: Never Smoker  . Smokeless tobacco: Never Used  Substance and Sexual Activity  . Alcohol use: No  . Drug use: No  . Sexual activity: Not Currently  Lifestyle  . Physical activity:    Days per week: Not on file    Minutes per session: Not on file  . Stress: Not on file  Relationships  . Social connections:    Talks on phone: Not on file    Gets together: Not on file    Attends religious service: Not on file    Active  member of club or organization: Not on file    Attends meetings of clubs or organizations: Not on file    Relationship status: Not on file  . Intimate partner violence:    Fear of current or ex partner: Not on file    Emotionally abused: Not on file    Physically abused: Not on file    Forced sexual activity: Not on file  Other Topics Concern  . Not on file  Social History Narrative  . Not on file   Functional Status Survey:    Allergies  Allergen Reactions  . Ace Inhibitors Anaphylaxis and Swelling  . Penicillins Itching, Swelling and Other (See Comments)    SWELLING REACTION UNSPECIFIED   PATIENT HAS HAD A PCN REACTION WITH IMMEDIATE RASH, FACIAL/TONGUE/THROAT SWELLING, SOB, OR LIGHTHEADEDNESS WITH HYPOTENSION:  #  #  YES  #  #  Has patient had a PCN reaction causing severe rash involving mucus membranes or skin necrosis: No Has patient had a PCN reaction that required hospitalization No Has patient had a PCN reaction occurring within the last 10 years: No If all of the above answers are "NO", then may proceed with Cephalosporin use.   . Statins Other (See Comments)    elevated LFT's  . Albuterol Swelling    SWELLING REACTION UNSPECIFIED     Pertinent  Health Maintenance Due  Topic Date Due  . FOOT EXAM  08/29/2017  . OPHTHALMOLOGY EXAM  10/09/2017 (Originally 01/12/2017)  . URINE MICROALBUMIN  10/09/2017 (Originally 02/27/2017)  . PAP SMEAR  01/28/2023 (Originally 09/18/2016)  . INFLUENZA VACCINE  10/21/2017  . HEMOGLOBIN A1C  02/18/2018  . MAMMOGRAM  12/17/2018  . COLONOSCOPY  12/19/2026    Medications: Outpatient Encounter Medications as of 09/30/2017  Medication Sig  . aspirin EC 81 MG EC tablet Take 1 tablet (81 mg total) by mouth daily. With Food  . azelastine (ASTELIN) 0.1 % nasal spray Place 2 sprays into both nostrils 2 (two) times daily. Use in each nostril as directed  . calcitRIOL (ROCALTROL) 0.25 MCG capsule Take 0.25 mcg by mouth every Monday,  Wednesday, and Friday.   . Calcium Carbonate Antacid (CALCIUM CARBONATE, DOSED IN MG ELEMENTAL CALCIUM,) 1250 MG/5ML SUSP Take 5 mLs (500 mg of elemental calcium  total) by mouth every 6 (six) hours as needed for indigestion.  . camphor-menthol (SARNA) lotion Apply 1 application topically every 8 (eight) hours as needed for itching.  . cloNIDine (CATAPRES) 0.1 MG tablet TAKE 1 TABLET BY MOUTH  EVERY EVENING AT 9PM FOR  BLOOD PRESSURE  . dexlansoprazole (DEXILANT) 60 MG capsule 1 PO EVERY MORNING WITH BREAKFAST.  Marland Kitchen dextromethorphan-guaiFENesin (MUCINEX DM) 30-600 MG 12hr tablet Take 1 tablet by mouth 2 (two) times daily.  Marland Kitchen dicyclomine (BENTYL) 10 MG capsule Take 10 mg by mouth 4 (four) times daily as needed for spasms.  Marland Kitchen ezetimibe (ZETIA) 10 MG tablet Take 1 tablet (10 mg total) by mouth daily.  Marland Kitchen gabapentin (NEURONTIN) 100 MG capsule Take 1 capsule (100 mg total) by mouth 2 (two) times daily.  . hydrOXYzine (ATARAX/VISTARIL) 25 MG tablet Take 1 tablet (25 mg total) by mouth every 8 (eight) hours as needed for itching.  . insulin aspart (NOVOLOG) 100 UNIT/ML injection Inject 8 Units into the skin 3 (three) times daily with meals.  . insulin detemir (LEVEMIR) 100 UNIT/ML injection Inject 0.55 mLs (55 Units total) into the skin 2 (two) times daily.  . isosorbide mononitrate (IMDUR) 30 MG 24 hr tablet Take 1 tablet (30 mg total) by mouth daily.  . ondansetron (ZOFRAN ODT) 4 MG disintegrating tablet Take 1 tablet (4 mg total) by mouth every 8 (eight) hours as needed for nausea or vomiting.  . senna-docusate (SENOKOT-S) 8.6-50 MG tablet Take 2 tablets by mouth at bedtime as needed for mild constipation or moderate constipation.  . sertraline (ZOLOFT) 25 MG tablet Take 25 mg by mouth daily.  . sevelamer carbonate (RENVELA) 800 MG tablet Take 1 tablet (800 mg total) by mouth 3 (three) times daily with meals.  . torsemide (DEMADEX) 20 MG tablet Take 2 tablets (40 mg total) by mouth daily as needed (edema or  weight gain).  . traMADol (ULTRAM) 50 MG tablet Take 50 mg by mouth every 6 (six) hours as needed for moderate pain or severe pain.   . traMADol (ULTRAM) 50 MG tablet Take 50 mg by mouth at bedtime.  . Vitamin D, Ergocalciferol, (DRISDOL) 50000 units CAPS capsule Take 1 capsule (50,000 Units total) by mouth every 7 (seven) days.  . [DISCONTINUED] FLUoxetine (PROZAC) 10 MG capsule Take 10 mg by mouth daily.    . [DISCONTINUED] glipiZIDE (GLUCOTROL) 10 MG tablet Take 10 mg by mouth 2 (two) times daily before a meal.     No facility-administered encounter medications on file as of 09/30/2017.      Review of Systems   General she is not complaining of any fever chills-her weight appears to be stable  Skin does not complain of rashes itching or diaphoresis.  Head ears eyes nose mouth and throat does not complain of a sore throat or visual changes.  RESP- does not complain of being short of breath or having increased cough  Cardiac does not complain of chest pain edema appears to be stable and fairly mild  GI does not complain of abdominal pain nausea vomiting diarrhea constipation  GU does not complain of dysuria.   Musculoskeletal says her leg pain is significantly improved  Neurologic does not complain of dizziness headache does have a history of peripheral neuropathy does not complain of syncope  Psych is not complaining of being anxious or depressed she is looking forward to going home.    Vitals:   09/30/17 1304  BP: (!) 166/85  Pulse: 68  Resp:  20  Temp: (!) 97.5 F (36.4 C)  TempSrc: Oral  Manual blood pressure was 170/82 Weight is 186 pounds Physical Exam  In general this is a pleasant obese middle-aged female in no distress  Her skin is warm and dry.  Eyes visual acuity appears to be intact sclera and conjunctive are clear  Oropharynx is clear mucous membranes moist  Chest is clear to auscultation there is no labored breathing  Heart is regular rate and  rhythm without murmur gallop or rub she has mild lower extremity edema  Abdomen is obese soft nontender with positive bowel sounds.  Muscle skeletal is able to move all extremities x4 strength appears to be intact-she is ambulating about 70 feet with a rolling walker  Neurologic is grossly intact her speech is clear no lateralizing findings.  Psych she is alert and oriented pleasant and appropriate     Labs reviewed: Basic Metabolic Panel: Recent Labs    09/20/17 0450 09/21/17 0451 09/22/17 0436 09/23/17 0451 09/27/17 0833  NA 142 143 147* 145 146*  K 3.9 3.7 4.1 4.3 4.0  CL 104 106 111 111 111  CO2 23 23 25 25 26   GLUCOSE 210* 257* 180* 174* 124*  BUN 116* 95* 85* 71* 54*  CREATININE 6.45* 4.99* 4.30* 3.95* 3.17*  CALCIUM 9.8 9.7 10.1 9.8 9.7  MG 1.8 1.6* 1.6* 1.6* 1.9  PHOS 6.3* 4.6 3.7  --   --    Liver Function Tests: Recent Labs    08/15/17 1018 08/31/17 1343 09/13/17 0710  09/20/17 0450 09/21/17 0451 09/22/17 0436  AST 17 16 34  --   --   --   --   ALT 23 18 38  --   --   --   --   ALKPHOS 98 94 160*  --   --   --   --   BILITOT 0.6 0.5 0.4  --   --   --   --   PROT 6.6 6.3* 8.4*  --   --   --   --   ALBUMIN 2.5* 2.0* 2.8*   < > 2.9* 2.7* 2.6*   < > = values in this interval not displayed.   Recent Labs    02/15/17 1031 03/24/17 1027 03/25/17 0408 03/31/17 0845  LIPASE 163* 107* 79* 143*  AMYLASE 82  --   --   --    No results for input(s): AMMONIA in the last 8760 hours. CBC: Recent Labs    08/31/17 2330  09/16/17 1503  09/21/17 0451 09/23/17 0451 09/27/17 0833  WBC 13.8*   < > 10.6*   < > 10.8* 9.6 8.9  NEUTROABS 9.5*  --  6.5  --   --   --  4.7  HGB 6.8*   < > 9.3*   < > 9.0* 8.5* 8.6*  HCT 23.0*   < > 30.6*   < > 30.1* 29.1* 29.1*  MCV 73.2*   < > 75.9*   < > 75.1* 76.0* 74.8*  PLT 363   < > 555*   < > 459* 381 409*   < > = values in this interval not displayed.   Cardiac Enzymes: Recent Labs    08/18/17 1210 09/16/17 1503  09/19/17 1438  TROPONINI <0.03 <0.03 <0.03   BNP: Invalid input(s): POCBNP CBG: Recent Labs    09/22/17 2104 09/23/17 0746 09/23/17 1147  GLUCAP 205* 157* 193*    Procedures and Imaging Studies During Stay: Dg Chest 1  View  Result Date: 09/19/2017 CLINICAL DATA:  Recent syncopal episode EXAM: CHEST  1 VIEW COMPARISON:  09/16/2017 FINDINGS: Cardiac shadow is stable. The lungs are well aerated bilaterally. No focal infiltrate or sizable effusion is seen. No acute bony abnormality is noted. IMPRESSION: No active disease. Electronically Signed   By: Inez Catalina M.D.   On: 09/19/2017 15:33   Dg Chest 2 View  Result Date: 09/16/2017 CLINICAL DATA:  Syncopal episode while sitting today. EXAM: CHEST - 2 VIEW COMPARISON:  08/31/2017 FINDINGS: Lungs are hypoinflated and otherwise clear. Cardiomediastinal silhouette and remainder of the exam is unchanged. IMPRESSION: Hypoinflation without acute cardiopulmonary disease. Electronically Signed   By: Marin Olp M.D.   On: 09/16/2017 15:41   Dg Chest 2 View  Result Date: 08/31/2017 CLINICAL DATA:  Recent diagnosis of pneumonia. Bilateral lower extremity swelling and pain. EXAM: CHEST - 2 VIEW COMPARISON:  Chest radiograph Aug 18, 2017 FINDINGS: Cardiac silhouette is mildly enlarged. Mediastinal silhouette is not suspicious. Mildly calcified aortic arch. Pulmonary vascular congestion and mild interstitial prominence. LEFT lung base pleural thickening without pleural effusion or focal consolidation. No pneumothorax. Soft tissue planes and included osseous structures are unchanged. IMPRESSION: Mild cardiomegaly. Mild interstitial prominence, possible pulmonary edema. Aortic Atherosclerosis (ICD10-I70.0). Electronically Signed   By: Elon Alas M.D.   On: 08/31/2017 14:12   Ct Head Wo Contrast  Result Date: 09/19/2017 CLINICAL DATA:  Syncopal episode during bowel movement EXAM: CT HEAD WITHOUT CONTRAST TECHNIQUE: Contiguous axial images were  obtained from the base of the skull through the vertex without intravenous contrast. COMPARISON:  05/28/2011 FINDINGS: Brain: No evidence of acute infarction, hemorrhage, hydrocephalus, extra-axial collection or mass lesion/mass effect. Vascular: No hyperdense vessel or unexpected calcification. Skull: Normal. Negative for fracture or focal lesion. Sinuses/Orbits: No acute finding. Other: None. IMPRESSION: Normal head CT for age Electronically Signed   By: Inez Catalina M.D.   On: 09/19/2017 15:31    Assessment/Plan:    #1 history of syncope-secondary to orthostasis-there have been no further occurrences since diuretics were discontinued-in blood pressure medicine reduce down to clonidine 0.1 mg at night.  Will need follow-up by primary care provider- her blood pressure is somewhat elevated at times as noted above but would like to avoid further syncopal episodes especially since she is about to go home- will need expedient follow-up by primary care provider.   2.  History of CHF she does have an order for Demadex 40 mg for significant weight gain of greater than 3 pounds in a day or 5 pounds in a week- this has not occurred since her readmission from hospital  Will need expedient follow-up again by primary care provider but she has done well since her readmission.  3.  History of acute on chronic kidney disease stage V- recommendation by nephrology is likely dialysis at some point they will determine this apparently as an outpatient.  Recent labs look somewhat improved will need follow-up again by nephrology   #4- history of insulin-dependent diabetes-she continues on Levemir 55 units twice a day as well as NovoLog 8 units at lunch and supper if her CBG is greater than 150- as noted above this appears relatively stable with some variability later in the day-she is followed by endocrinology and apparently will have a follow-up appointment- apparently her appointment today was canceled but will be  rescheduled  #5-history of diabetic neuropathy she is on Neurontin and this is been stable during her stay here.  6.  History of hypocalcemia  this is being supplemented as noted above thought secondary to chronic kidney disease- calcium on lab earlier this week was 9.7.  7.  History of hyperlipidemia she is on a statin as well as Zetia.  8.  History of GERD she is on Dexilant this is been stable during her stay.  9 history of leg pain this appears to be improved she does receive tramadol at night as well as as needed during the day she says her legs are feeling much better  #10-depression she continues on Zoloft this appears stable.  #11 history of anemia of chronic disease hemoglobin of 8.6 on lab done earlier this week appears relatively stable  Again she will be going home she has a very supportive family will need extensive PT and OT for strengthening as well as nursing support for her multiple medical issues as well as CNA support to help with activities of daily living- she will need a transfer tub bench in a wheelchair secondary to continued weakness also will need expedient follow-up with endocrinology as well as nephrology her primary care provider.  [id number]- of note greater than 30 minutes spent on this discharge summary- greater than 50% of time spent coordinating a plan of care for numerous diagnoses

## 2017-10-04 ENCOUNTER — Telehealth: Payer: Self-pay | Admitting: Family Medicine

## 2017-10-04 NOTE — Telephone Encounter (Signed)
Caryl Pina from [employer] calling to request verbal order for skill nursing. [phone]

## 2017-10-05 NOTE — Telephone Encounter (Signed)
Spoke with Caryl Pina from [employer] and gave verbal order for skilled nursing for patient

## 2017-10-11 ENCOUNTER — Encounter: Payer: Self-pay | Admitting: Surgery

## 2017-10-11 ENCOUNTER — Encounter (HOSPITAL_COMMUNITY): Payer: Self-pay

## 2017-10-11 ENCOUNTER — Encounter: Payer: Self-pay | Admitting: Cardiology

## 2017-10-11 NOTE — Progress Notes (Deleted)
Cardiology Office Note  Date: 10/11/2017   ID: MARLISA Beck, DOB 1955/06/23, MRN [id number]  PCP: Fayrene Helper, MD  Primary Cardiologist: Rozann Lesches, MD   No chief complaint on file.   History of Present Illness: Kathryn Beck is a 62 y.o. female last seen in May 2018.  Records indicate recent hospitalization with orthostasis and syncope.  She was hydrated with adjustments in medical therapy during hospital stay, renal function as of discharge had improved somewhat with BUN/creatinine 71 and 3.95.  Discharge dose of diuretics were down titrated and she continued to have follow-up assessment with nephrology.  Recent follow-up echocardiogram and ECG are outlined below.  Past Medical History:  Diagnosis Date  . Acid reflux   . Anemia   . Arthritis   . Axillary masses    Soft tissue - status post excision  . Back pain   . CKD (chronic kidney disease) stage 5, GFR less than 15 ml/min (HCC)   . Diastolic heart failure ([place])   . Essential hypertension   . Ischemic heart disease    Abnormal Myoview April 2018 - medical therapy  . Mixed hyperlipidemia   . Obesity   . Pancreatitis   . Sleep apnea    Noncompliant with CPAP  . Type 2 diabetes mellitus, uncontrolled ([place])     Past Surgical History:  Procedure Laterality Date  . ABDOMINAL HYSTERECTOMY    . AV FISTULA PLACEMENT Left 09/02/2017   Procedure: creation of left arm ARTERIOVENOUS (AV) FISTULA;  Surgeon: Serafina Mitchell, MD;  Location: [facility] OR;  Service: Vascular;  Laterality: Left;  . COLONOSCOPY  2008   Dr. Oneida Alar: normal   . COLONOSCOPY N/A 12/18/2016   Dr. Oneida Alar: multiple tubular adenomas, internal hemorrhoids. Surveillance in 3 years   . ESOPHAGEAL DILATION N/A 10/13/2015   Procedure: ESOPHAGEAL DILATION;  Surgeon: Rogene Houston, MD;  Location: [facility];  Service: Endoscopy;  Laterality: N/A;  . ESOPHAGOGASTRODUODENOSCOPY N/A 10/13/2015   Dr. Laural Golden: chronic gastritis on path, no H.pylori.  Empiric dilation   . ESOPHAGOGASTRODUODENOSCOPY N/A 12/18/2016   Dr. Oneida Alar: mild gastritis. BRAVO study revealed uncontrolled GERD. Dysphagia secondary to uncontrolled reflux  . FOOT SURGERY Bilateral   . LUNG BIOPSY    . MASS EXCISION Right 01/09/2013   Procedure: EXCISION OF NEOPLASM OF RIGHT  AXILLA  AND EXCISION OF NEOPLASM OF LEFT AXILLA;  Surgeon: Jamesetta So, MD;  Location: [facility];  Service: General;  Laterality: Right;  procedure end @ 08:23  . MYRINGOTOMY WITH TUBE PLACEMENT Bilateral 04/28/2017   Procedure: BILATERAL MYRINGOTOMY WITH TUBE PLACEMENT;  Surgeon: Leta Baptist, MD;  Location: [facility];  Service: ENT;  Laterality: Bilateral;  . SAVORY DILATION N/A 12/18/2016   Procedure: SAVORY DILATION;  Surgeon: Danie Binder, MD;  Location: [facility];  Service: Endoscopy;  Laterality: N/A;    Current Outpatient Medications  Medication Sig Dispense Refill  . aspirin EC 81 MG EC tablet Take 1 tablet (81 mg total) by mouth daily. With Food 30 tablet 1  . azelastine (ASTELIN) 0.1 % nasal spray Place 2 sprays into both nostrils 2 (two) times daily. Use in each nostril as directed 30 mL 5  . calcitRIOL (ROCALTROL) 0.25 MCG capsule Take 0.25 mcg by mouth every Monday, Wednesday, and Friday.  36 capsule 0  . Calcium Carbonate Antacid (CALCIUM CARBONATE, DOSED IN MG ELEMENTAL CALCIUM,) 1250 MG/5ML SUSP Take 5 mLs (500 mg of elemental calcium total) by mouth every 6 (  six) hours as needed for indigestion. 450 mL 1  . camphor-menthol (SARNA) lotion Apply 1 application topically every 8 (eight) hours as needed for itching. 222 mL 0  . cloNIDine (CATAPRES) 0.1 MG tablet TAKE 1 TABLET BY MOUTH  EVERY EVENING AT 9PM FOR  BLOOD PRESSURE 30 tablet 5  . dexlansoprazole (DEXILANT) 60 MG capsule 1 PO EVERY MORNING WITH BREAKFAST. 30 capsule 11  . dextromethorphan-guaiFENesin (MUCINEX DM) 30-600 MG 12hr tablet Take 1 tablet by mouth 2 (two) times daily. 20 tablet 0  . dicyclomine (BENTYL) 10 MG capsule Take  10 mg by mouth 4 (four) times daily as needed for spasms.    Marland Kitchen ezetimibe (ZETIA) 10 MG tablet Take 1 tablet (10 mg total) by mouth daily. 30 tablet 5  . gabapentin (NEURONTIN) 100 MG capsule Take 1 capsule (100 mg total) by mouth 2 (two) times daily. 90 capsule 3  . hydrOXYzine (ATARAX/VISTARIL) 25 MG tablet Take 1 tablet (25 mg total) by mouth every 8 (eight) hours as needed for itching. 30 tablet 0  . insulin aspart (NOVOLOG) 100 UNIT/ML injection Inject 8 Units into the skin 3 (three) times daily with meals.    . insulin detemir (LEVEMIR) 100 UNIT/ML injection Inject 0.55 mLs (55 Units total) into the skin 2 (two) times daily. 10 mL 11  . isosorbide mononitrate (IMDUR) 30 MG 24 hr tablet Take 1 tablet (30 mg total) by mouth daily. 30 tablet 5  . ondansetron (ZOFRAN ODT) 4 MG disintegrating tablet Take 1 tablet (4 mg total) by mouth every 8 (eight) hours as needed for nausea or vomiting. 20 tablet 0  . senna-docusate (SENOKOT-S) 8.6-50 MG tablet Take 2 tablets by mouth at bedtime as needed for mild constipation or moderate constipation. 120 tablet 1  . sertraline (ZOLOFT) 25 MG tablet Take 25 mg by mouth daily.    . sevelamer carbonate (RENVELA) 800 MG tablet Take 1 tablet (800 mg total) by mouth 3 (three) times daily with meals. 90 tablet 0  . torsemide (DEMADEX) 20 MG tablet Take 2 tablets (40 mg total) by mouth daily as needed (edema or weight gain). 30 tablet 0  . traMADol (ULTRAM) 50 MG tablet Take 50 mg by mouth every 6 (six) hours as needed for moderate pain or severe pain.     . traMADol (ULTRAM) 50 MG tablet Take 50 mg by mouth at bedtime.    . Vitamin D, Ergocalciferol, (DRISDOL) 50000 units CAPS capsule Take 1 capsule (50,000 Units total) by mouth every 7 (seven) days. 4 capsule 5   No current facility-administered medications for this visit.    Allergies:  Ace inhibitors; Penicillins; Statins; and Albuterol   Social History: The patient  reports that she has never smoked. She has  never used smokeless tobacco. She reports that she does not drink alcohol or use drugs.   Family History: The patient's family history includes Arthritis in her father; Breast cancer in her sister; Hypercholesterolemia in her father and sister; Hypertension in her father, sister, and sister.   ROS:  Please see the history of present illness. Otherwise, complete review of systems is positive for {NONE DEFAULTED:18576::"none"}.  All other systems are reviewed and negative.   Physical Exam: VS:  There were no vitals taken for this visit., BMI There is no height or weight on file to calculate BMI.  Wt Readings from Last 3 Encounters:  09/24/17 182 lb 12.8 oz (82.9 kg)  09/23/17 184 lb 4.9 oz (83.6 kg)  09/08/17 201 lb  15.1 oz (91.6 kg)    General: Patient appears comfortable at rest. HEENT: Conjunctiva and lids normal, oropharynx clear with moist mucosa. Neck: Supple, no elevated JVP or carotid bruits, no thyromegaly. Lungs: Clear to auscultation, nonlabored breathing at rest. Cardiac: Regular rate and rhythm, no S3 or significant systolic murmur, no pericardial rub. Abdomen: Soft, nontender, no hepatomegaly, bowel sounds present, no guarding or rebound. Extremities: No pitting edema, distal pulses 2+. Skin: Warm and dry. Musculoskeletal: No kyphosis. Neuropsychiatric: Alert and oriented x3, affect grossly appropriate.  ECG: I personally reviewed the tracing from 08/23/2017 which showed sinus rhythm with nonspecific T wave changes.  Recent Labwork: 12/24/2016: TSH 4.16 08/31/2017: B Natriuretic Peptide 84.5 09/13/2017: ALT 38; AST 34 09/27/2017: BUN 54; Creatinine, Ser 3.17; Hemoglobin 8.6; Magnesium 1.9; Platelets 409; Potassium 4.0; Sodium 146     Component Value Date/Time   CHOL 234 (H) 02/25/2017 0804   TRIG 414 (H) 03/24/2017 1632   HDL 38 (L) 02/25/2017 0804   CHOLHDL 6.2 (H) 02/25/2017 0804   VLDL NOT CALC 05/19/2016 1344   [place]  02/25/2017 0804     Comment:     . LDL  cholesterol not calculated. Triglyceride levels greater than 400 mg/dL invalidate calculated LDL results. . Reference range: <100 . Desirable range <100 mg/dL for primary prevention;   <70 mg/dL for patients with CHD or diabetic patients  with > or = 2 CHD risk factors. Marland Kitchen LDL-C is now calculated using the Martin-Hopkins  calculation, which is a validated novel method providing  better accuracy than the Friedewald equation in the  estimation of LDL-C.  Cresenciano Genre et al. Annamaria Helling. 8588;502(77): 2061-2068  (http://education.[employer].com/faq/FAQ164)    LDLDIRECT 107 05/19/2016 1344    Other Studies Reviewed Today:  Echocardiogram 09/01/2017: Study Conclusions  - Left ventricle: The cavity size was normal. Systolic function was   normal. The estimated ejection fraction was in the range of 55%   to 60%. Wall motion was normal; there were no regional wall   motion abnormalities. Doppler parameters are consistent with   abnormal left ventricular relaxation (grade 1 diastolic   dysfunction). - Left atrium: The atrium was mildly dilated.  Assessment and Plan:   Current medicines were reviewed with the patient today.  No orders of the defined types were placed in this encounter.   Disposition:  Signed, Satira Sark, MD, [facility] 10/11/2017 2:02 PM    [facility] [address] [place], [place] [address] Phone: [phone]; Fax: [phone]

## 2017-10-12 ENCOUNTER — Ambulatory Visit: Payer: Self-pay | Admitting: Cardiology

## 2017-10-13 ENCOUNTER — Encounter: Payer: Self-pay | Admitting: Family Medicine

## 2017-10-13 ENCOUNTER — Ambulatory Visit (INDEPENDENT_AMBULATORY_CARE_PROVIDER_SITE_OTHER): Payer: 59 | Admitting: Family Medicine

## 2017-10-13 ENCOUNTER — Other Ambulatory Visit: Payer: Self-pay

## 2017-10-13 ENCOUNTER — Encounter: Payer: Self-pay | Admitting: Cardiology

## 2017-10-13 VITALS — BP 188/80 | HR 79 | Ht 64.0 in | Wt 183.0 lb

## 2017-10-13 DIAGNOSIS — IMO0002 Reserved for concepts with insufficient information to code with codable children: Secondary | ICD-10-CM

## 2017-10-13 DIAGNOSIS — N184 Chronic kidney disease, stage 4 (severe): Secondary | ICD-10-CM

## 2017-10-13 DIAGNOSIS — I10 Essential (primary) hypertension: Secondary | ICD-10-CM

## 2017-10-13 DIAGNOSIS — I5032 Chronic diastolic (congestive) heart failure: Secondary | ICD-10-CM

## 2017-10-13 DIAGNOSIS — E1121 Type 2 diabetes mellitus with diabetic nephropathy: Secondary | ICD-10-CM | POA: Diagnosis not present

## 2017-10-13 DIAGNOSIS — E1165 Type 2 diabetes mellitus with hyperglycemia: Secondary | ICD-10-CM

## 2017-10-13 DIAGNOSIS — R296 Repeated falls: Secondary | ICD-10-CM

## 2017-10-13 DIAGNOSIS — Z794 Long term (current) use of insulin: Secondary | ICD-10-CM

## 2017-10-13 MED ORDER — HYDROCHLOROTHIAZIDE 12.5 MG PO CAPS: 12.5000 mg | ORAL_CAPSULE | Freq: Every day | ORAL | 3 refills | Status: DC

## 2017-10-13 MED ORDER — CLONIDINE HCL 0.2 MG PO TABS: ORAL_TABLET | ORAL | 3 refills | Status: DC

## 2017-10-13 NOTE — Patient Instructions (Signed)
F/u in 3.weeks , call if you need me sooner  Increase in clonidine to 0.2 mg one at bedtime Additional blood pressure medication is HCTZ 12.5 mg one every morning, Contunue Imdur one every day as before  You are referred to Dr Birdie Sons , his office will call you

## 2017-10-15 ENCOUNTER — Telehealth: Payer: Self-pay | Admitting: Family Medicine

## 2017-10-15 ENCOUNTER — Encounter (HOSPITAL_COMMUNITY)
Admission: RE | Admit: 2017-10-15 | Discharge: 2017-10-15 | Disposition: A | Discharge status: Home / Self Care | Payer: 59 | Source: Ambulatory Visit | Attending: Nephrology | Admitting: Nephrology

## 2017-10-15 ENCOUNTER — Encounter (HOSPITAL_COMMUNITY)
Admission: RE | Admit: 2017-10-15 | Discharge: 2017-10-15 | Disposition: A | Discharge status: Home / Self Care | Payer: 59 | Source: Skilled Nursing Facility | Attending: Nephrology | Admitting: Nephrology

## 2017-10-15 ENCOUNTER — Encounter (HOSPITAL_COMMUNITY): Payer: Self-pay

## 2017-10-15 ENCOUNTER — Ambulatory Visit (HOSPITAL_COMMUNITY): Payer: Self-pay

## 2017-10-15 DIAGNOSIS — K219 Gastro-esophageal reflux disease without esophagitis: Secondary | ICD-10-CM | POA: Diagnosis not present

## 2017-10-15 DIAGNOSIS — K861 Other chronic pancreatitis: Secondary | ICD-10-CM | POA: Insufficient documentation

## 2017-10-15 DIAGNOSIS — M1990 Unspecified osteoarthritis, unspecified site: Secondary | ICD-10-CM | POA: Diagnosis not present

## 2017-10-15 DIAGNOSIS — N189 Chronic kidney disease, unspecified: Secondary | ICD-10-CM | POA: Insufficient documentation

## 2017-10-15 DIAGNOSIS — I5032 Chronic diastolic (congestive) heart failure: Secondary | ICD-10-CM | POA: Diagnosis not present

## 2017-10-15 LAB — RENAL FUNCTION PANEL
ANION GAP: 10 (ref 5–15)
Albumin: 3 g/dL — ABNORMAL LOW (ref 3.5–5.0)
BUN: 49 mg/dL — AB (ref 8–23)
CHLORIDE: 109 mmol/L (ref 98–111)
CO2: 24 mmol/L (ref 22–32)
Calcium: 9.4 mg/dL (ref 8.9–10.3)
Creatinine, Ser: 2.98 mg/dL — ABNORMAL HIGH (ref 0.44–1.00)
GFR calc Af Amer: 18 mL/min — ABNORMAL LOW (ref >60)
GFR, EST NON AFRICAN AMERICAN: 16 mL/min — AB (ref >60)
GLUCOSE: 166 mg/dL — AB (ref 70–99)
PHOSPHORUS: 2.9 mg/dL (ref 2.5–4.6)
POTASSIUM: 4.5 mmol/L (ref 3.5–5.1)
Sodium: 143 mmol/L (ref 135–145)

## 2017-10-15 LAB — FERRITIN: FERRITIN: 760 ng/mL — AB (ref 11–307)

## 2017-10-15 LAB — IRON AND TIBC
IRON: 47 ug/dL (ref 28–170)
SATURATION RATIOS: 18 % (ref 10.4–31.8)
TIBC: 266 ug/dL (ref 250–450)
UIBC: 219 ug/dL

## 2017-10-15 LAB — POCT HEMOGLOBIN-HEMACUE: Hemoglobin: 8.6 g/dL — ABNORMAL LOW (ref 12.0–15.0)

## 2017-10-15 MED ORDER — DARBEPOETIN ALFA 100 MCG/0.5ML IJ SOSY
100.0000 ug | PREFILLED_SYRINGE | Freq: Once | INTRAMUSCULAR | Status: AC
  Administered 2017-10-15: 100 ug via SUBCUTANEOUS

## 2017-10-15 MED ORDER — DARBEPOETIN ALFA 100 MCG/0.5ML IJ SOSY
PREFILLED_SYRINGE | INTRAMUSCULAR | Status: AC
  Filled 2017-10-15: qty 0.5

## 2017-10-15 NOTE — Discharge Instructions (Signed)

## 2017-10-15 NOTE — Progress Notes (Signed)
Patient is new to [facility].  Teaching done with she and her daughter.  Written material provided.  Verbalized understanding.

## 2017-10-15 NOTE — Telephone Encounter (Signed)
Dan Physical Therapist w/ [facility] left voicemail requesting verbal for 1x for 1 week, then 3x for 2 weeks. Cb# [id number]/ [id number]

## 2017-10-15 NOTE — Telephone Encounter (Signed)
Verbal order given  

## 2017-10-18 LAB — PTH, INTACT AND CALCIUM
CALCIUM TOTAL (PTH): 9.4 mg/dL (ref 8.7–10.3)
PTH: 78 pg/mL — ABNORMAL HIGH (ref 15–65)

## 2017-10-19 ENCOUNTER — Telehealth: Payer: Self-pay | Admitting: Family Medicine

## 2017-10-19 DIAGNOSIS — N185 Chronic kidney disease, stage 5: Secondary | ICD-10-CM

## 2017-10-19 DIAGNOSIS — E1122 Type 2 diabetes mellitus with diabetic chronic kidney disease: Secondary | ICD-10-CM

## 2017-10-19 DIAGNOSIS — I5032 Chronic diastolic (congestive) heart failure: Secondary | ICD-10-CM

## 2017-10-19 DIAGNOSIS — I951 Orthostatic hypotension: Secondary | ICD-10-CM

## 2017-10-19 DIAGNOSIS — E1140 Type 2 diabetes mellitus with diabetic neuropathy, unspecified: Secondary | ICD-10-CM

## 2017-10-19 DIAGNOSIS — E669 Obesity, unspecified: Secondary | ICD-10-CM

## 2017-10-19 DIAGNOSIS — D631 Anemia in chronic kidney disease: Secondary | ICD-10-CM

## 2017-10-19 DIAGNOSIS — M545 Low back pain: Secondary | ICD-10-CM

## 2017-10-19 DIAGNOSIS — I132 Hypertensive heart and chronic kidney disease with heart failure and with stage 5 chronic kidney disease, or end stage renal disease: Secondary | ICD-10-CM

## 2017-10-19 DIAGNOSIS — G4733 Obstructive sleep apnea (adult) (pediatric): Secondary | ICD-10-CM

## 2017-10-19 DIAGNOSIS — K219 Gastro-esophageal reflux disease without esophagitis: Secondary | ICD-10-CM

## 2017-10-19 DIAGNOSIS — Z992 Dependence on renal dialysis: Secondary | ICD-10-CM

## 2017-10-19 DIAGNOSIS — M1990 Unspecified osteoarthritis, unspecified site: Secondary | ICD-10-CM

## 2017-10-19 NOTE — Telephone Encounter (Signed)
Message regarding Kathryn Beck home health pt being discontinued because patient is doing great is noted

## 2017-10-19 NOTE — Telephone Encounter (Signed)
Cat with [facility] is calling to advise PT is doing great, and they are discontinuing the Home Health at this time.

## 2017-10-20 ENCOUNTER — Other Ambulatory Visit: Payer: Self-pay | Admitting: Family Medicine

## 2017-10-22 ENCOUNTER — Encounter: Payer: Self-pay | Admitting: Family Medicine

## 2017-10-22 NOTE — Assessment & Plan Note (Addendum)
Currently receiving PT/ OT following prolonged hospitalization and nursing home care in home Home safety reviewed briefly

## 2017-10-22 NOTE — Assessment & Plan Note (Signed)
Currently stable, no s/s of decompensation currently

## 2017-10-22 NOTE — Progress Notes (Signed)
Kathryn Beck     MRN: [id number]      DOB: [birth year]   HPI Kathryn Beck is here for follow up and re-evaluation of chronic medical conditions, medication management and review of any available recent lab and radiology data.  She is coming from home , but had prolonged  Illness requiring multiple hospitalizations and nursing home stay over a  6 .week period. SNF stay following hospitalization from 5/29 to 08/20/2017 with community acquired pneumonia, then from 06/11 to 6/19 for acute on chronic diastolic congestive heart failure and acute kidney injury Transferred to [facility] from 6/20 readmitted to hospital on 06/30 with syncope from [facility], discharged back to the center on 09/23/2017 and discharged home on 09/30/2017 Currently has left arm aV fistula placed on 09/02/2017 in anticipation of the need for dialysis, but is still voiding on her own Arrangements for PT/OT  At home are in place She reports gradually regaining strength, denies leg swelling, appetite is fair Concerned about blood pressure  ROS Denies recent fever or chills.c/o fatigue and generalized weakness Denies sinus pressure, nasal congestion, ear pain or sore throat. Denies chest congestion, productive cough or wheezing. Denies chest pains, palpitations and leg swelling Denies abdominal pain, nausea, vomiting,diarrhea or constipation.   Denies dysuria, frequency, hesitancy or incontinence. C/o  limitation in mobility. Denies headaches, seizures, c/o lower extremity numbness,and  tingling. Denies uncontrolled  depression, anxiety or insomnia. Denies skin break down or rash.   PE  BP (!) 188/80   Pulse 79   Ht 5\' 4"  (1.626 m)   Wt 183 lb (83 kg)   SpO2 97%   BMI 31.41 kg/m   Patient alert and oriented, chronically ill appearing  HEENT: No facial asymmetry, EOMI,   oropharynx pink and moist.  Neck decreased ROM no JVD, no mass.  Chest: Clear to auscultation bilaterally.  CVS: S1, S2 no murmurs,  no S3.Regular rate.  ABD: Soft non tender.   Ext: No edema  MS: Decreased ROM spine, shoulders, hips and knees.  Skin: Intact, no ulcerations or rash noted.  Psych: Good eye contact, normal affect. Memory intact not anxious or depressed appearing.  CNS: CN 2-12 intact, power,  normal throughout.no focal deficits noted.   Assessment & Plan  Malignant hypertension Uncontrolled with medication adjustment made, and f/u in 3 weeks  CKD (chronic kidney disease) stage 4, GFR 15-29 ml/min (HCC) Needs follow up with nephrology asap. Fistula in place but currently adequately diuresisng  Uncontrolled diabetes mellitus with diabetic nephropathy, with long-term current use of insulin ([place]) Followed by endocrinology andneeds follow up appt Kathryn Beck is reminded of the importance of commitment to daily physical activity for 30 minutes or more, as able and the need to limit carbohydrate intake to 30 to 60 grams per meal to help with blood sugar control.   The need to take medication as prescribed, test blood sugar as directed, and to call between visits if there is a concern that blood sugar is uncontrolled is also discussed.   Kathryn Beck is reminded of the importance of daily foot exam, annual eye examination, and good blood sugar, blood pressure and cholesterol control.  Diabetic Labs Latest Ref Rng & Units 10/15/2017 09/27/2017 09/23/2017 09/22/2017 09/21/2017  HbA1c 4.8 - 5.6 % - - - - -  Microalbumin Not estab mg/dL - - - - -  Micro/Creat Ratio <30 mcg/mg creat - - - - -  Chol <200 mg/dL - - - - -  HDL >  50 mg/dL - - - - -  Calc LDL <100 mg/dL - - - - -  Triglycerides <150 mg/dL - - - - -  Creatinine 0.44 - 1.00 mg/dL 2.98(H) 3.17(H) 3.95(H) 4.30(H) 4.99(H)   BP/Weight 10/15/2017 10/13/2017 09/30/2017 09/27/2017 09/24/2017 08/29/6166 05/27/2900  Systolic BP 111 552 080 223 361 224 497  Diastolic BP 72 80 85 75 68 62 68  Wt. (Lbs) - 183 - - 182.8 - 184.3  BMI - 31.41 - - 31.38 - 31.64   Foot/eye  exam completion dates Latest Ref Rng & Units 08/24/2016 01/13/2016  Eye Exam No Retinopathy - No Retinopathy  Foot Form Completion - Done -         Chronic diastolic CHF (congestive heart failure) (HCC) Currently stable, no s/s of decompensation currently  Recurrent falls Currently receiving PT/ OT following prolonged hospitalization and nursing home care in home Home safety reviewed briefly

## 2017-10-22 NOTE — Assessment & Plan Note (Signed)
Uncontrolled with medication adjustment made, and f/u in 3 weeks

## 2017-10-22 NOTE — Assessment & Plan Note (Signed)
Followed by endocrinology andneeds follow up appt Kathryn Beck is reminded of the importance of commitment to daily physical activity for 30 minutes or more, as able and the need to limit carbohydrate intake to 30 to 60 grams per meal to help with blood sugar control.   The need to take medication as prescribed, test blood sugar as directed, and to call between visits if there is a concern that blood sugar is uncontrolled is also discussed.   Kathryn Beck is reminded of the importance of daily foot exam, annual eye examination, and good blood sugar, blood pressure and cholesterol control.  Diabetic Labs Latest Ref Rng & Units 10/15/2017 09/27/2017 09/23/2017 09/22/2017 09/21/2017  HbA1c 4.8 - 5.6 % - - - - -  Microalbumin Not estab mg/dL - - - - -  Micro/Creat Ratio <30 mcg/mg creat - - - - -  Chol <200 mg/dL - - - - -  HDL >50 mg/dL - - - - -  Calc LDL <100 mg/dL - - - - -  Triglycerides <150 mg/dL - - - - -  Creatinine 0.44 - 1.00 mg/dL 2.98(H) 3.17(H) 3.95(H) 4.30(H) 4.99(H)   BP/Weight 10/15/2017 10/13/2017 09/30/2017 09/27/2017 09/24/2017 05/25/4560 07/27/3891  Systolic BP 734 287 681 157 262 035 597  Diastolic BP 72 80 85 75 68 62 68  Wt. (Lbs) - 183 - - 182.8 - 184.3  BMI - 31.41 - - 31.38 - 31.64   Foot/eye exam completion dates Latest Ref Rng & Units 08/24/2016 01/13/2016  Eye Exam No Retinopathy - No Retinopathy  Foot Form Completion - Done -

## 2017-10-22 NOTE — Assessment & Plan Note (Signed)
Needs follow up with nephrology asap. Fistula in place but currently adequately diuresisng

## 2017-10-25 ENCOUNTER — Other Ambulatory Visit: Payer: Self-pay

## 2017-10-25 ENCOUNTER — Ambulatory Visit (HOSPITAL_COMMUNITY)
Admit: 2017-10-25 | Discharge: 2017-10-25 | Disposition: A | Discharge status: Home / Self Care | Payer: 59 | Attending: Surgery | Admitting: Surgery

## 2017-10-25 ENCOUNTER — Ambulatory Visit (INDEPENDENT_AMBULATORY_CARE_PROVIDER_SITE_OTHER): Payer: Self-pay | Admitting: Surgery

## 2017-10-25 ENCOUNTER — Encounter: Payer: Self-pay | Admitting: Surgery

## 2017-10-25 VITALS — BP 179/72 | HR 72 | Temp 97.6°F | Resp 16 | Ht 64.0 in | Wt 186.0 lb

## 2017-10-25 DIAGNOSIS — N185 Chronic kidney disease, stage 5: Secondary | ICD-10-CM

## 2017-10-25 DIAGNOSIS — Z48812 Encounter for surgical aftercare following surgery on the circulatory system: Secondary | ICD-10-CM | POA: Diagnosis not present

## 2017-10-25 NOTE — Progress Notes (Signed)
Patient name: Kathryn Beck MRN: [id number] DOB: Jul 22, 1955 Sex: female  REASON FOR VISIT:     post op  HISTORY OF PRESENT ILLNESS:   Kathryn Beck is a 62 y.o. female who returns today for follow up.  She is status post left brachiocephalic fistula on 1/32/4401.  She is not yet on dialysis.  She has no complaints of steal  CURRENT MEDICATIONS:    Current Outpatient Medications  Medication Sig Dispense Refill  . aspirin EC 81 MG EC tablet Take 1 tablet (81 mg total) by mouth daily. With Food 30 tablet 1  . azelastine (ASTELIN) 0.1 % nasal spray Place 2 sprays into both nostrils 2 (two) times daily. Use in each nostril as directed 30 mL 5  . calcitRIOL (ROCALTROL) 0.25 MCG capsule Take 0.25 mcg by mouth every Monday, Wednesday, and Friday.  36 capsule 0  . Calcium Carbonate Antacid (CALCIUM CARBONATE, DOSED IN MG ELEMENTAL CALCIUM,) 1250 MG/5ML SUSP Take 5 mLs (500 mg of elemental calcium total) by mouth every 6 (six) hours as needed for indigestion. 450 mL 1  . camphor-menthol (SARNA) lotion Apply 1 application topically every 8 (eight) hours as needed for itching. 222 mL 0  . cloNIDine (CATAPRES) 0.2 MG tablet One tablet at bedtime every night for blood pressure at 9 pm 30 tablet 3  . dexlansoprazole (DEXILANT) 60 MG capsule TAKE 1 CAPSULE IN THE MORNING WITH FOOD 28 capsule 0  . dextromethorphan-guaiFENesin (MUCINEX DM) 30-600 MG 12hr tablet Take 1 tablet by mouth 2 (two) times daily. 20 tablet 0  . dicyclomine (BENTYL) 10 MG capsule TAKE (1) CAPSULE FOUR TIMES DAILY AS NEEDED FOR DIARRHEA OR ABDOMINALCRAMPS. 30 capsule 0  . ezetimibe (ZETIA) 10 MG tablet Take 1 tablet (10 mg total) by mouth daily. 30 tablet 5  . gabapentin (NEURONTIN) 100 MG capsule TAKE (1) CAPSULE BY MOUTH TWICE DAILY. 56 capsule 0  . HUMALOG KWIKPEN 100 UNIT/ML KiwkPen INJECT 8 UNITS WITH BREAKFAST LUNCH AND SUPPER IF SUGAR IS ABOVE 150. 6 mL 0  . hydrochlorothiazide  (MICROZIDE) 12.5 MG capsule Take 1 capsule (12.5 mg total) by mouth daily. 30 capsule 3  . hydrOXYzine (ATARAX/VISTARIL) 25 MG tablet Take 1 tablet (25 mg total) by mouth every 8 (eight) hours as needed for itching. 30 tablet 0  . insulin aspart (NOVOLOG) 100 UNIT/ML injection Inject 8 Units into the skin 3 (three) times daily with meals.    . insulin detemir (LEVEMIR) 100 UNIT/ML injection Inject 0.55 mLs (55 Units total) into the skin 2 (two) times daily. 10 mL 11  . isosorbide mononitrate (IMDUR) 30 MG 24 hr tablet Take 1 tablet (30 mg total) by mouth daily. 30 tablet 5  . LEVEMIR FLEXTOUCH 100 UNIT/ML Pen INJECT 55 UNITS INTO THE SKIN TWICE DAILY 30 mL 0  . ondansetron (ZOFRAN ODT) 4 MG disintegrating tablet Take 1 tablet (4 mg total) by mouth every 8 (eight) hours as needed for nausea or vomiting. 20 tablet 0  . senna-docusate (SENOKOT-S) 8.6-50 MG tablet Take 2 tablets by mouth at bedtime as needed for mild constipation or moderate constipation. 120 tablet 1  . sertraline (ZOLOFT) 25 MG tablet TAKE 1 TABLET BY MOUTH ONCE DAILY. 28 tablet 0  . torsemide (DEMADEX) 20 MG tablet TAKE (2) TABLETS BY MOUTH ONCE DAILY AS NEEDED (EDEMA OR WEIGHT GAIN;3 POUNDS IN A DAY OR 5 POUNDS IN A WEEK). 30 tablet 0  . traMADol (ULTRAM) 50 MG tablet Take 50 mg by mouth every  6 (six) hours as needed for moderate pain or severe pain.     Marland Kitchen ULTICARE MINI PEN NEEDLES 31G X 6 MM MISC USE AS DIRECTED 100 each 0  . Vitamin D, Ergocalciferol, (DRISDOL) 50000 units CAPS capsule Take 1 capsule (50,000 Units total) by mouth every 7 (seven) days. 4 capsule 5   No current facility-administered medications for this visit.     REVIEW OF SYSTEMS:   [X]  denotes positive finding, [ ]  denotes negative finding Cardiac  Comments:  Chest pain or chest pressure:    Shortness of breath upon exertion:    Short of breath when lying flat:    Irregular heart rhythm:    Constitutional    Fever or chills:      PHYSICAL EXAM:    Vitals:   10/25/17 1029  BP: (!) 179/72  Pulse: 72  Resp: 16  Temp: 97.6 F (36.4 C)  TempSrc: Oral  SpO2: 98%  Weight: 186 lb (84.4 kg)  Height: 5\' 4"  (1.626 m)    GENERAL: The patient is a well-nourished female, in no acute distress. The vital signs are documented above. CARDIOVASCULAR: There is a regular rate and rhythm. PULMONARY: Non-labored respirations All incisions have healed nicely.  There is an excellent thrill within the fistula  STUDIES:   +------------+----------+-------------+----------+--------------+ OUTFLOW VEINPSV (cm/s)Diameter (cm)Depth (cm)  Describe   +------------+----------+-------------+----------+--------------+ Prox UA     224    0.68     1.12          +------------+----------+-------------+----------+--------------+ Mid UA     154    0.74     0.48  branch 0.365cm +------------+----------+-------------+----------+--------------+ Dist UA     184    0.51     0.48          +------------+----------+-------------+----------+--------------+ AC Fossa    445    1.07     0.27          +------------+----------+-------------+----------+--------------+   MEDICAL ISSUES:   Status post left brachiocephalic fistula.  This has matured nicely.  He will be ready for use on December 03, 2017.  Annamarie Major, MD [facility] [phone] Pager [phone]

## 2017-10-26 ENCOUNTER — Other Ambulatory Visit: Payer: Self-pay | Admitting: Family Medicine

## 2017-10-28 ENCOUNTER — Encounter: Payer: Self-pay | Admitting: Internal Medicine

## 2017-11-01 ENCOUNTER — Ambulatory Visit (INDEPENDENT_AMBULATORY_CARE_PROVIDER_SITE_OTHER): Payer: 59 | Admitting: Orthopedic Surgery

## 2017-11-01 ENCOUNTER — Encounter: Payer: Self-pay | Admitting: Orthopedic Surgery

## 2017-11-01 ENCOUNTER — Ambulatory Visit (INDEPENDENT_AMBULATORY_CARE_PROVIDER_SITE_OTHER): Payer: 59

## 2017-11-01 ENCOUNTER — Ambulatory Visit (INDEPENDENT_AMBULATORY_CARE_PROVIDER_SITE_OTHER): Payer: 59 | Admitting: Family Medicine

## 2017-11-01 ENCOUNTER — Encounter: Payer: Self-pay | Admitting: Family Medicine

## 2017-11-01 VITALS — BP 178/84 | HR 88 | Resp 16 | Ht 64.0 in | Wt 189.0 lb

## 2017-11-01 VITALS — Temp 97.8°F | Resp 16 | Ht 64.0 in | Wt 187.0 lb

## 2017-11-01 DIAGNOSIS — I10 Essential (primary) hypertension: Secondary | ICD-10-CM

## 2017-11-01 DIAGNOSIS — M25511 Pain in right shoulder: Secondary | ICD-10-CM | POA: Diagnosis not present

## 2017-11-01 DIAGNOSIS — I5032 Chronic diastolic (congestive) heart failure: Secondary | ICD-10-CM | POA: Diagnosis not present

## 2017-11-01 DIAGNOSIS — M25519 Pain in unspecified shoulder: Secondary | ICD-10-CM | POA: Insufficient documentation

## 2017-11-01 MED ORDER — ACETAMINOPHEN-CODEINE #3 300-30 MG PO TABS: 1.0000 | ORAL_TABLET | Freq: Four times a day (QID) | ORAL | 0 refills | Status: DC | PRN

## 2017-11-01 NOTE — Assessment & Plan Note (Addendum)
5 day h/o acute right shoulder pain and swelling with limited mobility, no trauma, refer to orthopedics for acute management due to severity of pain and limitation in mobility

## 2017-11-01 NOTE — Patient Instructions (Addendum)
F/u as before , call if yu tyou need me sooner  You  Have an appointment  This evening with Dr Aline Brochure, please keep the appointment

## 2017-11-01 NOTE — Progress Notes (Signed)
   Kathryn Beck     MRN: [id number]      DOB: [birth year]   HPI Kathryn Beck is here with a 4 day h/o pain and swelling of right shoulder severe;ly  limiting mobility , no trauma, first episode   ROS Denies recent fever or chills. Denies sinus pressure, nasal congestion, ear pain or sore throat. Denies chest congestion, productive cough or wheezing. Denies chest pains, palpitations and leg swelling Denies abdominal pain, nausea, vomiting,diarrhea or constipation.   Denies dysuria, frequency, hesitancy or incontinence. Denies headaches, seizures, numbness, or tingling. Denies depression, anxiety or insomnia. Denies skin break down or rash.   PE  BP (!) 178/84   Pulse 88   Resp 16   Ht 5\' 4"  (1.626 m)   Wt 189 lb (85.7 kg)   SpO2 99%   BMI 32.44 kg/m   Patient alert and oriented and in no cardiopulmonary distress.Pt in severe pain with marked limitation  / no mobility in right shoulder  HEENT: No facial asymmetry, EOMI,   oropharynx pink and moist.  Neck supple no JVD, no mass.  Chest: Clear to auscultation bilaterally.  CVS: S1, S2 no murmurs, no S3.Regular rate.  ABD: Soft non tender.   Ext: No edema  MS: Adequate ROM spine, , hips and knees. Decreased ROM right shoulder wich is swollen  Skin: Intact, no ulcerations or rash noted.  Psych: Good eye contact, normal affect. Memory intact not anxious or depressed appearing.  CNS: CN 2-12 intact, power,  normal throughout.no focal deficits noted.   Assessment & Plan  Nontraumatic shoulder pain, right 5 day h/o acute right shoulder pain and swelling with limited mobility, no trauma, refer to orthopedics for acute management due to severity of pain and limitation in mobility  Malignant hypertension Uncontrolled , however,  Patient in severe pain,no change in medication at this viist  Chronic diastolic CHF (congestive heart failure) (HCC) Stable, no signs/ symptoms of decompensation at this  visit

## 2017-11-01 NOTE — Progress Notes (Signed)
Chief Complaint  Patient presents with  . Shoulder Pain    right shoulder pain and swelling since 10/29/17 no injury     Patient new problem  Acute consult request by Dr. Moshe Cipro  62 year old female diabetic kidney disease recently placed AV fistula on the left recently released from the hospital and using a walker for the last 3 to 4 weeks comes in with acute pain right shoulder over the acromioclavicular joint since Friday no history of trauma patient has pain over the [facility] joint right shoulder right upper arm with decreased range of motion and inability to dress  Pain is severe dull aching nonradiating.   Current Outpatient Medications:  .  aspirin EC 81 MG EC tablet, Take 1 tablet (81 mg total) by mouth daily. With Food, Disp: 30 tablet, Rfl: 1 .  azelastine (ASTELIN) 0.1 % nasal spray, Place 2 sprays into both nostrils 2 (two) times daily. Use in each nostril as directed, Disp: 30 mL, Rfl: 5 .  calcitRIOL (ROCALTROL) 0.25 MCG capsule, Take 0.25 mcg by mouth every Monday, Wednesday, and Friday. , Disp: 36 capsule, Rfl: 0 .  Calcium Carbonate Antacid (CALCIUM CARBONATE, DOSED IN MG ELEMENTAL CALCIUM,) 1250 MG/5ML SUSP, Take 5 mLs (500 mg of elemental calcium total) by mouth every 6 (six) hours as needed for indigestion., Disp: 450 mL, Rfl: 1 .  camphor-menthol (SARNA) lotion, Apply 1 application topically every 8 (eight) hours as needed for itching., Disp: 222 mL, Rfl: 0 .  cloNIDine (CATAPRES) 0.2 MG tablet, One tablet at bedtime every night for blood pressure at 9 pm, Disp: 30 tablet, Rfl: 3 .  dexlansoprazole (DEXILANT) 60 MG capsule, TAKE 1 CAPSULE IN THE MORNING WITH FOOD, Disp: 28 capsule, Rfl: 0 .  dextromethorphan-guaiFENesin (MUCINEX DM) 30-600 MG 12hr tablet, Take 1 tablet by mouth 2 (two) times daily., Disp: 20 tablet, Rfl: 0 .  dicyclomine (BENTYL) 10 MG capsule, TAKE (1) CAPSULE FOUR TIMES DAILY AS NEEDED FOR DIARRHEA OR ABDOMINALCRAMPS., Disp: 30 capsule, Rfl: 0 .  ezetimibe  (ZETIA) 10 MG tablet, Take 1 tablet (10 mg total) by mouth daily., Disp: 30 tablet, Rfl: 5 .  gabapentin (NEURONTIN) 100 MG capsule, TAKE (1) CAPSULE BY MOUTH TWICE DAILY., Disp: 56 capsule, Rfl: 0 .  HUMALOG KWIKPEN 100 UNIT/ML KiwkPen, INJECT 8 UNITS WITH BREAKFAST LUNCH AND SUPPER IF SUGAR IS ABOVE 150., Disp: 6 mL, Rfl: 0 .  hydrochlorothiazide (MICROZIDE) 12.5 MG capsule, Take 1 capsule (12.5 mg total) by mouth daily., Disp: 30 capsule, Rfl: 3 .  hydrOXYzine (ATARAX/VISTARIL) 25 MG tablet, Take 1 tablet (25 mg total) by mouth every 8 (eight) hours as needed for itching., Disp: 30 tablet, Rfl: 0 .  insulin aspart (NOVOLOG) 100 UNIT/ML injection, Inject 8 Units into the skin 3 (three) times daily with meals., Disp: , Rfl:  .  isosorbide mononitrate (IMDUR) 30 MG 24 hr tablet, Take 1 tablet (30 mg total) by mouth daily., Disp: 30 tablet, Rfl: 5 .  LEVEMIR FLEXTOUCH 100 UNIT/ML Pen, INJECT 55 UNITS INTO THE SKIN TWICE DAILY, Disp: 30 mL, Rfl: 0 .  ondansetron (ZOFRAN ODT) 4 MG disintegrating tablet, Take 1 tablet (4 mg total) by mouth every 8 (eight) hours as needed for nausea or vomiting., Disp: 20 tablet, Rfl: 0 .  senna-docusate (SENOKOT-S) 8.6-50 MG tablet, Take 2 tablets by mouth at bedtime as needed for mild constipation or moderate constipation., Disp: 120 tablet, Rfl: 1 .  sertraline (ZOLOFT) 25 MG tablet, TAKE 1 TABLET BY MOUTH ONCE DAILY.,  Disp: 28 tablet, Rfl: 0 .  torsemide (DEMADEX) 20 MG tablet, TAKE (2) TABLETS BY MOUTH ONCE DAILY AS NEEDED (EDEMA OR WEIGHT GAIN;3 POUNDS IN A DAY OR 5 POUNDS IN A WEEK)., Disp: 30 tablet, Rfl: 0 .  traMADol (ULTRAM) 50 MG tablet, TAKE ONE TABLET BY MOUTH AT BEDTIME. MAY TAKE 1 TABLET UP TO EVERY 6 HOURS AS NEEDED FOR PAIN. (MAX OF 4 PER DAY), Disp: 30 tablet, Rfl: 0 .  ULTICARE MINI PEN NEEDLES 31G X 6 MM MISC, USE AS DIRECTED, Disp: 100 each, Rfl: 0 .  Vitamin D, Ergocalciferol, (DRISDOL) 50000 units CAPS capsule, Take 1 capsule (50,000 Units total) by  mouth every 7 (seven) days., Disp: 4 capsule, Rfl: 5 .  insulin detemir (LEVEMIR) 100 UNIT/ML injection, Inject 0.55 mLs (55 Units total) into the skin 2 (two) times daily. (Patient not taking: Reported on 11/01/2017), Disp: 10 mL, Rfl: 11   Allergies  Allergen Reactions  . Ace Inhibitors Anaphylaxis and Swelling  . Penicillins Itching, Swelling and Other (See Comments)    SWELLING REACTION UNSPECIFIED   PATIENT HAS HAD A PCN REACTION WITH IMMEDIATE RASH, FACIAL/TONGUE/THROAT SWELLING, SOB, OR LIGHTHEADEDNESS WITH HYPOTENSION:  #  #  YES  #  #  Has patient had a PCN reaction causing severe rash involving mucus membranes or skin necrosis: No Has patient had a PCN reaction that required hospitalization No Has patient had a PCN reaction occurring within the last 10 years: No If all of the above answers are "NO", then may proceed with Cephalosporin use.   . Statins Other (See Comments)    elevated LFT's  . Albuterol Swelling    SWELLING REACTION UNSPECIFIED     Past Medical History:  Diagnosis Date  . Acid reflux   . Anemia   . Arthritis   . Axillary masses    Soft tissue - status post excision  . Back pain   . CKD (chronic kidney disease) stage 5, GFR less than 15 ml/min (HCC)   . Diastolic heart failure ([place])   . Essential hypertension   . Ischemic heart disease    Abnormal Myoview April 2018 - medical therapy  . Mixed hyperlipidemia   . Obesity   . Pancreatitis   . Sleep apnea    Noncompliant with CPAP  . Type 2 diabetes mellitus, uncontrolled (HCC)     Temp 97.8 F (36.6 C)   Resp 16   Ht 5\' 4"  (1.626 m)   Wt 187 lb (84.8 kg)   BMI 32.10 kg/m    Patient is in a wheelchair today.  Grooming hygiene normal oriented x3 mood pleasant affect flat gait again is supported by walker not observed patient in wheelchair.  Inspection of the right shoulder shows no redness erythema no warmth but tenderness and prominence of the distal clavicle and tenderness of the [facility] joint she  has pain and limited range of motion because of the pain in all planes of the right shoulder basically not examinable.  Stability could not be tested muscle tone normal skin looks normal.  Pulse and perfusion normal lymph nodes are negative and sensation is intact  Left shoulder limited external rotation palpable functional AV fistula in the left distal upper arm  X-ray shows normal glenohumeral joint prominence of the distal clavicle without any bony erosion  Inject right AC joint  Patient consented for right acromioclavicular joint injection  Sterile prep 25-gauge needle 40 mg Deprol 3 to 4 cc of lidocaine tolerated well no complications  follow-up in a week next Wednesday afternoon stop tramadol start Tylenol with codeine   [email or web address] search completed  10/20/2017  3  10/20/2017  Tramadol Hcl 50 Mg Tablet  30.00 8 Ma Sim  [id number]  [place] [phone])  1/1 18.75 MME Comm Ins  [place] 10/01/2017  3  10/01/2017  Tramadol Hcl 50 Mg Tablet  30.00 8 An Gup  [id number]  [place] ([id number])  1/1 18.75 MME Comm Ins  [place] 09/28/2017  2  09/27/2017  Tramadol Hcl 50 Mg Tablet  45.00 9 An Gup  [id number]  [place] ([id number])  1/1 25.00 MME Medicare  [place] 09/09/2017  2  09/09/2017  Tramadol Hcl 50 Mg Tablet  30.00 15 An Gup  [id number]  [place] ([id number])  1/1 10.00 MME Medicare  [place] 06/10/2017  1  06/09/2017  Hydrocodone-Acetamin 5-325 Mg  15.00 4 [place]  [id number]  [place] ([id number])  1/1 18.75 MME Comm Ins  [place]

## 2017-11-02 ENCOUNTER — Telehealth: Payer: Self-pay | Admitting: Family Medicine

## 2017-11-02 NOTE — Telephone Encounter (Signed)
PT son Kalayah Leske dropped of FMLA Copied Sleeved  NOTED

## 2017-11-04 ENCOUNTER — Encounter: Payer: Self-pay | Admitting: Family Medicine

## 2017-11-04 NOTE — Assessment & Plan Note (Signed)
Uncontrolled , however,  Patient in severe pain,no change in medication at this viist

## 2017-11-04 NOTE — Assessment & Plan Note (Signed)
Stable, no signs/ symptoms of decompensation at this  visit

## 2017-11-04 NOTE — Telephone Encounter (Signed)
Tavis stopped in the office, wanted to know if the forms was completed. I advised that the Dr had 10 days to complete the forms.

## 2017-11-05 ENCOUNTER — Ambulatory Visit (HOSPITAL_COMMUNITY)
Admission: RE | Admit: 2017-11-05 | Discharge: 2017-11-05 | Disposition: A | Discharge status: Home / Self Care | Payer: 59 | Source: Ambulatory Visit | Attending: Nurse Practitioner | Admitting: Nurse Practitioner

## 2017-11-05 ENCOUNTER — Encounter (HOSPITAL_COMMUNITY): Payer: Self-pay | Admitting: Hematology

## 2017-11-05 ENCOUNTER — Inpatient Hospital Stay (HOSPITAL_COMMUNITY): Payer: 59 | Attending: Hematology | Admitting: Hematology

## 2017-11-05 ENCOUNTER — Inpatient Hospital Stay (HOSPITAL_COMMUNITY): Payer: 59

## 2017-11-05 ENCOUNTER — Other Ambulatory Visit: Payer: Self-pay

## 2017-11-05 ENCOUNTER — Telehealth: Payer: Self-pay | Admitting: Family Medicine

## 2017-11-05 VITALS — BP 168/73 | HR 82 | Temp 98.7°F | Resp 16

## 2017-11-05 DIAGNOSIS — Z79899 Other long term (current) drug therapy: Secondary | ICD-10-CM | POA: Insufficient documentation

## 2017-11-05 DIAGNOSIS — Z8781 Personal history of (healed) traumatic fracture: Secondary | ICD-10-CM | POA: Diagnosis not present

## 2017-11-05 DIAGNOSIS — M25511 Pain in right shoulder: Secondary | ICD-10-CM | POA: Diagnosis not present

## 2017-11-05 DIAGNOSIS — R29898 Other symptoms and signs involving the musculoskeletal system: Secondary | ICD-10-CM

## 2017-11-05 DIAGNOSIS — E1122 Type 2 diabetes mellitus with diabetic chronic kidney disease: Secondary | ICD-10-CM | POA: Diagnosis not present

## 2017-11-05 DIAGNOSIS — M129 Arthropathy, unspecified: Secondary | ICD-10-CM | POA: Diagnosis not present

## 2017-11-05 DIAGNOSIS — I5032 Chronic diastolic (congestive) heart failure: Secondary | ICD-10-CM | POA: Diagnosis not present

## 2017-11-05 DIAGNOSIS — D472 Monoclonal gammopathy: Secondary | ICD-10-CM

## 2017-11-05 DIAGNOSIS — R2681 Unsteadiness on feet: Secondary | ICD-10-CM

## 2017-11-05 DIAGNOSIS — E0590 Thyrotoxicosis, unspecified without thyrotoxic crisis or storm: Secondary | ICD-10-CM

## 2017-11-05 DIAGNOSIS — E669 Obesity, unspecified: Secondary | ICD-10-CM

## 2017-11-05 DIAGNOSIS — K219 Gastro-esophageal reflux disease without esophagitis: Secondary | ICD-10-CM | POA: Insufficient documentation

## 2017-11-05 DIAGNOSIS — M7989 Other specified soft tissue disorders: Secondary | ICD-10-CM | POA: Diagnosis not present

## 2017-11-05 DIAGNOSIS — R5383 Other fatigue: Secondary | ICD-10-CM | POA: Insufficient documentation

## 2017-11-05 DIAGNOSIS — E782 Mixed hyperlipidemia: Secondary | ICD-10-CM | POA: Insufficient documentation

## 2017-11-05 DIAGNOSIS — D649 Anemia, unspecified: Secondary | ICD-10-CM

## 2017-11-05 DIAGNOSIS — M1990 Unspecified osteoarthritis, unspecified site: Secondary | ICD-10-CM | POA: Diagnosis not present

## 2017-11-05 DIAGNOSIS — E785 Hyperlipidemia, unspecified: Secondary | ICD-10-CM

## 2017-11-05 DIAGNOSIS — D509 Iron deficiency anemia, unspecified: Secondary | ICD-10-CM | POA: Diagnosis not present

## 2017-11-05 DIAGNOSIS — K5900 Constipation, unspecified: Secondary | ICD-10-CM | POA: Diagnosis not present

## 2017-11-05 DIAGNOSIS — Z794 Long term (current) use of insulin: Secondary | ICD-10-CM | POA: Diagnosis not present

## 2017-11-05 DIAGNOSIS — I132 Hypertensive heart and chronic kidney disease with heart failure and with stage 5 chronic kidney disease, or end stage renal disease: Secondary | ICD-10-CM

## 2017-11-05 DIAGNOSIS — G4730 Sleep apnea, unspecified: Secondary | ICD-10-CM

## 2017-11-05 DIAGNOSIS — N184 Chronic kidney disease, stage 4 (severe): Secondary | ICD-10-CM

## 2017-11-05 LAB — LACTATE DEHYDROGENASE: LDH: 290 U/L — ABNORMAL HIGH (ref 98–192)

## 2017-11-05 NOTE — Telephone Encounter (Signed)
They are complete. He wants his FMLA faxed back today and can come collect the original

## 2017-11-05 NOTE — Telephone Encounter (Signed)
Left patient a voicemail that her paperwork was ready for pickup.

## 2017-11-05 NOTE — Assessment & Plan Note (Signed)
1.  Monoclonal gammopathy of uncertain significance: - Patient with history of CKD stage IV, work-up for monoclonal gammopathy on 08/09/2017 showed an M spike of 0.1 g/dL of IgG kappa monoclonal protein.  Free kappa light chains were elevated at 171 and lambda light chains 75.7.  Ratio was 2.27.  24-hour urine showed nephrotic range proteinuria.  This was 18 g.  M spike was positive. -Patient had a recent admission to the hospital with pneumonia, spent a month in rehab, currently at home for the past 1 month.  Denies any new onset pains except right shoulder pain for which she had a shot. - Currently walks with the help of walker at home.  Denies any B symptoms including fevers, night sweats or weight loss. - I have discussed with the patient and her family and her daughter over the phone about MGUS, normal prognosis and management. -I plan to repeat SPEP, free light chain assay, beta-2 microglobulin, LDH and a skeletal survey.  I will see her back after these results. -If MGUS is confirmed, she will need follow-up visits once every 6 months with repeat blood work.  We will repeat skeletal survey once a year.  1% of people with MGUS per year transform to myeloma.  2.  CKD: Thought to be secondary to long history of diabetes, hypertension, obesity related glomerulopathy. -She also has nephrotic range proteinuria.  She follows up with Dr. Theador Hawthorne.  3.  Microcytic anemia: - She has been receiving intermittent iron infusions.  She is still microcytic.  She could have a component of thalassemia trait.

## 2017-11-05 NOTE — Progress Notes (Signed)
CONSULT NOTE  Patient Care Team: Fayrene Helper, MD as PCP - General Domenic Polite Aloha Gell, MD as PCP - Cardiology (Cardiology) Gala Romney Cristopher Estimable, MD as Consulting Physician (Gastroenterology) Fran Lowes, MD as Consulting Physician (Nephrology)  CHIEF COMPLAINTS/PURPOSE OF CONSULTATION:  M spike on serum protein after versus.  HISTORY OF PRESENTING ILLNESS:  Kathryn Beck 62 y.o. female is seen in consultation today at the request of Dr. Mahala Menghini for further work-up of M spike.  She has stage IV chronic kidney disease thought to be from underlying long-standing diabetes and hypertension.  She underwent serum protein electrophoresis on 08/09/2017 which showed an M spike of 0.1 g/dL.  Immunofixation showed IgG kappa light chain specificity.  Free kappa light chains were elevated at 171.9, free lambda light chain 75.7 and ratio of 2.27.  24-hour urine showed nephrotic range proteinuria of 18.7 g.  24-hour urine also showed M spike and positive immunofixation.  She was recently admitted to the hospital for pneumonia.  She is spent about a month in the rehab.  She was finally discharged home in a month ago.  She is also undergoing rehab at home.  She is able to walk with the help of walker.  Denies any fevers, night sweats or weight loss in the last 6 months.  Denies any nausea, vomiting, diarrhea, bleeding per rectum.  She did have some right shoulder pain for which she received a shot recently.  She is independent of all ADLs.  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  Denies any chemical exposure. Family history does not reveal any sickle cell anemia or thalassemia or myeloma.   MEDICAL HISTORY:  Past Medical History:  Diagnosis Date  . Acid reflux   . Anemia   . Arthritis   . Axillary masses    Soft tissue - status post excision  . Back pain   . CKD (chronic kidney disease) stage 5, GFR less than 15 ml/min (HCC)   . Diastolic heart failure ([place])   . Essential  hypertension   . Ischemic heart disease    Abnormal Myoview April 2018 - medical therapy  . Mixed hyperlipidemia   . Obesity   . Pancreatitis   . Sleep apnea    Noncompliant with CPAP  . Type 2 diabetes mellitus, uncontrolled ([place])     SURGICAL HISTORY: Past Surgical History:  Procedure Laterality Date  . ABDOMINAL HYSTERECTOMY    . AV FISTULA PLACEMENT Left 09/02/2017   Procedure: creation of left arm ARTERIOVENOUS (AV) FISTULA;  Surgeon: Serafina Mitchell, MD;  Location: [facility] OR;  Service: Vascular;  Laterality: Left;  . COLONOSCOPY  2008   Dr. Oneida Alar: normal   . COLONOSCOPY N/A 12/18/2016   Dr. Oneida Alar: multiple tubular adenomas, internal hemorrhoids. Surveillance in 3 years   . ESOPHAGEAL DILATION N/A 10/13/2015   Procedure: ESOPHAGEAL DILATION;  Surgeon: Rogene Houston, MD;  Location: [facility];  Service: Endoscopy;  Laterality: N/A;  . ESOPHAGOGASTRODUODENOSCOPY N/A 10/13/2015   Dr. Laural Golden: chronic gastritis on path, no H.pylori. Empiric dilation   . ESOPHAGOGASTRODUODENOSCOPY N/A 12/18/2016   Dr. Oneida Alar: mild gastritis. BRAVO study revealed uncontrolled GERD. Dysphagia secondary to uncontrolled reflux  . FOOT SURGERY Bilateral   . LUNG BIOPSY    . MASS EXCISION Right 01/09/2013   Procedure: EXCISION OF NEOPLASM OF RIGHT  AXILLA  AND EXCISION OF NEOPLASM OF LEFT AXILLA;  Surgeon: Jamesetta So, MD;  Location: [facility] ORS;  Service: General;  Laterality: Right;  procedure end @ 08:23  . MYRINGOTOMY WITH TUBE PLACEMENT Bilateral 04/28/2017   Procedure: BILATERAL MYRINGOTOMY WITH TUBE PLACEMENT;  Surgeon: Leta Baptist, MD;  Location: [facility];  Service: ENT;  Laterality: Bilateral;  . SAVORY DILATION N/A 12/18/2016   Procedure: SAVORY DILATION;  Surgeon: Danie Binder, MD;  Location: [facility];  Service: Endoscopy;  Laterality: N/A;    SOCIAL HISTORY: Social History   Socioeconomic History  . Marital status: Married    Spouse name: Not on file  . Number of children: Not on file  .  Years of education: Not on file  . Highest education level: Not on file  Occupational History  . Not on file  Social Needs  . Financial resource strain: Not on file  . Food insecurity:    Worry: Not on file    Inability: Not on file  . Transportation needs:    Medical: Not on file    Non-medical: Not on file  Tobacco Use  . Smoking status: Never Smoker  . Smokeless tobacco: Never Used  Substance and Sexual Activity  . Alcohol use: No  . Drug use: No  . Sexual activity: Not Currently  Lifestyle  . Physical activity:    Days per week: Not on file    Minutes per session: Not on file  . Stress: Not on file  Relationships  . Social connections:    Talks on phone: Not on file    Gets together: Not on file    Attends religious service: Not on file    Active member of club or organization: Not on file    Attends meetings of clubs or organizations: Not on file    Relationship status: Not on file  . Intimate partner violence:    Fear of current or ex partner: Not on file    Emotionally abused: Not on file    Physically abused: Not on file    Forced sexual activity: Not on file  Other Topics Concern  . Not on file  Social History Narrative  . Not on file    FAMILY HISTORY: Family History  Problem Relation Age of Onset  . Hypertension Father   . Hypercholesterolemia Father   . Arthritis Father   . Hypertension Sister   . Hypercholesterolemia Sister   . Breast cancer Sister   . Hypertension Sister   . Colon cancer Neg Hx   . Colon polyps Neg Hx     ALLERGIES:  is allergic to ace inhibitors; penicillins; statins; and albuterol.  MEDICATIONS:  Current Outpatient Medications  Medication Sig Dispense Refill  . acetaminophen (TYLENOL) 500 MG tablet Take by mouth.    Marland Kitchen acetaminophen-codeine (TYLENOL #3) 300-30 MG tablet Take 1 tablet by mouth every 6 (six) hours as needed for moderate pain. 30 tablet 0  . amLODipine (NORVASC) 10 MG tablet Take by mouth.    Marland Kitchen aspirin EC  81 MG EC tablet Take 1 tablet (81 mg total) by mouth daily. With Food 30 tablet 1  . atorvastatin (LIPITOR) 20 MG tablet Take by mouth.    Marland Kitchen azelastine (ASTELIN) 0.1 % nasal spray Place 2 sprays into both nostrils 2 (two) times daily. Use in each nostril as directed 30 mL 5  . calcitRIOL (ROCALTROL) 0.25 MCG capsule Take 0.25 mcg by mouth every Monday, Wednesday, and Friday.  36 capsule 0  . Calcium Carbonate Antacid (CALCIUM CARBONATE, DOSED IN MG ELEMENTAL CALCIUM,) 1250 MG/5ML SUSP Take 5 mLs (500 mg of elemental calcium  total) by mouth every 6 (six) hours as needed for indigestion. 450 mL 1  . camphor-menthol (SARNA) lotion Apply 1 application topically every 8 (eight) hours as needed for itching. 222 mL 0  . cloNIDine (CATAPRES) 0.2 MG tablet One tablet at bedtime every night for blood pressure at 9 pm 30 tablet 3  . dexlansoprazole (DEXILANT) 60 MG capsule TAKE 1 CAPSULE IN THE MORNING WITH FOOD 28 capsule 0  . dextromethorphan-guaiFENesin (MUCINEX DM) 30-600 MG 12hr tablet Take 1 tablet by mouth 2 (two) times daily. 20 tablet 0  . dicyclomine (BENTYL) 10 MG capsule TAKE (1) CAPSULE FOUR TIMES DAILY AS NEEDED FOR DIARRHEA OR ABDOMINALCRAMPS. 30 capsule 0  . ezetimibe (ZETIA) 10 MG tablet Take 1 tablet (10 mg total) by mouth daily. 30 tablet 5  . furosemide (LASIX) 40 MG tablet Take by mouth.    . gabapentin (NEURONTIN) 100 MG capsule TAKE (1) CAPSULE BY MOUTH TWICE DAILY. 56 capsule 0  . HUMALOG KWIKPEN 100 UNIT/ML KiwkPen INJECT 8 UNITS WITH BREAKFAST LUNCH AND SUPPER IF SUGAR IS ABOVE 150. 6 mL 0  . hydrochlorothiazide (MICROZIDE) 12.5 MG capsule Take 1 capsule (12.5 mg total) by mouth daily. 30 capsule 3  . hydrOXYzine (ATARAX/VISTARIL) 25 MG tablet Take 1 tablet (25 mg total) by mouth every 8 (eight) hours as needed for itching. 30 tablet 0  . insulin aspart (NOVOLOG) 100 UNIT/ML injection Inject 8 Units into the skin 3 (three) times daily with meals.    . insulin detemir (LEVEMIR) 100  UNIT/ML injection Inject 0.55 mLs (55 Units total) into the skin 2 (two) times daily. 10 mL 11  . isosorbide mononitrate (IMDUR) 30 MG 24 hr tablet Take 1 tablet (30 mg total) by mouth daily. 30 tablet 5  . LEVEMIR FLEXTOUCH 100 UNIT/ML Pen INJECT 55 UNITS INTO THE SKIN TWICE DAILY 30 mL 0  . methocarbamol (ROBAXIN) 500 MG tablet Take by mouth.    . ondansetron (ZOFRAN ODT) 4 MG disintegrating tablet Take 1 tablet (4 mg total) by mouth every 8 (eight) hours as needed for nausea or vomiting. 20 tablet 0  . senna-docusate (SENOKOT-S) 8.6-50 MG tablet Take 2 tablets by mouth at bedtime as needed for mild constipation or moderate constipation. 120 tablet 1  . sertraline (ZOLOFT) 25 MG tablet TAKE 1 TABLET BY MOUTH ONCE DAILY. 28 tablet 0  . torsemide (DEMADEX) 20 MG tablet TAKE (2) TABLETS BY MOUTH ONCE DAILY AS NEEDED (EDEMA OR WEIGHT GAIN;3 POUNDS IN A DAY OR 5 POUNDS IN A WEEK). 30 tablet 0  . traMADol (ULTRAM) 50 MG tablet TAKE ONE TABLET BY MOUTH AT BEDTIME. MAY TAKE 1 TABLET UP TO EVERY 6 HOURS AS NEEDED FOR PAIN. (MAX OF 4 PER DAY) 30 tablet 0  . ULTICARE MINI PEN NEEDLES 31G X 6 MM MISC USE AS DIRECTED 100 each 0  . Vitamin D, Ergocalciferol, (DRISDOL) 50000 units CAPS capsule Take 1 capsule (50,000 Units total) by mouth every 7 (seven) days. 4 capsule 5   No current facility-administered medications for this visit.     REVIEW OF SYSTEMS:   Constitutional: Denies fevers, chills or abnormal night sweats.  Fatigue. Eyes: Denies blurriness of vision, double vision or watery eyes Ears, nose, mouth, throat, and face: Denies mucositis or sore throat Respiratory: Denies cough, dyspnea or wheezes Cardiovascular: Denies palpitation, chest discomfort or lower extremity swelling Gastrointestinal:  Denies nausea, heartburn or change in bowel habits Skin: Denies abnormal skin rashes Lymphatics: Denies new lymphadenopathy or easy bruising  Neurological: Denies any new weakness.  Numbness in the feet  positive.  Behavioral/Psych: Mood is stable, no new changes  All other systems were reviewed with the patient and are negative.  PHYSICAL EXAMINATION: ECOG PERFORMANCE STATUS: 1 - Symptomatic but completely ambulatory  Vitals:   11/05/17 1412  BP: (!) 168/73  Pulse: 82  Resp: 16  Temp: 98.7 F (37.1 C)  SpO2: 100%   There were no vitals filed for this visit.  GENERAL:alert, no distress and comfortable SKIN: skin color, texture, turgor are normal, no rashes or significant lesions EYES: normal, conjunctiva are pink and non-injected, sclera clear OROPHARYNX:no exudate, no erythema and lips, buccal mucosa, and tongue normal  NECK: supple, thyroid normal size, non-tender, without nodularity LYMPH:  no palpable lymphadenopathy in the cervical, axillary or inguinal LUNGS: clear to auscultation and percussion with normal breathing effort HEART: regular rate & rhythm and no murmurs and positive for lower extremity edema.  ABDOMEN:abdomen soft, non-tender and normal bowel sounds Musculoskeletal:no cyanosis of digits and no clubbing  PSYCH: alert & oriented x 3 with fluent speech NEURO: no focal motor/sensory deficits  LABORATORY DATA:  I have reviewed the data as listed I have also obtained and reviewed her labs from the referring doctor.  RADIOGRAPHIC STUDIES: I have personally reviewed the radiological images as listed and agreed with the findings in the report. Dg Shoulder Right  Result Date: 11/01/2017 [facility] lateral right shoulder [facility] Right shoulder pain and swelling X-ray shows normal glenohumeral joint space normal greater tuberosity no fracture dislocation or head abnormality or graph prominent distal clavicle degenerative changes right acromioclavicular joint Impression acromioclavicular joint arthrosis   ASSESSMENT & PLAN:  MGUS (monoclonal gammopathy of unknown significance) 1.  Monoclonal gammopathy of uncertain significance: - Patient with history  of CKD stage IV, work-up for monoclonal gammopathy on 08/09/2017 showed an M spike of 0.1 g/dL of IgG kappa monoclonal protein.  Free kappa light chains were elevated at 171 and lambda light chains 75.7.  Ratio was 2.27.  24-hour urine showed nephrotic range proteinuria.  This was 18 g.  M spike was positive. -Patient had a recent admission to the hospital with pneumonia, spent a month in rehab, currently at home for the past 1 month.  Denies any new onset pains except right shoulder pain for which she had a shot. - Currently walks with the help of walker at home.  Denies any B symptoms including fevers, night sweats or weight loss. - I have discussed with the patient and her family and her daughter over the phone about MGUS, normal prognosis and management. -I plan to repeat SPEP, free light chain assay, beta-2 microglobulin, LDH and a skeletal survey.  I will see her back after these results. -If MGUS is confirmed, she will need follow-up visits once every 6 months with repeat blood work.  We will repeat skeletal survey once a year.  1% of people with MGUS per year transform to myeloma.  2.  CKD: Thought to be secondary to long history of diabetes, hypertension, obesity related glomerulopathy. -She also has nephrotic range proteinuria.  She follows up with Dr. Theador Hawthorne.  3.  Microcytic anemia: - She has been receiving intermittent iron infusions.  She is still microcytic.  She could have a component of thalassemia trait.    All questions were answered. The patient knows to call the clinic with any problems, questions or concerns.     Derek Jack, MD 11/05/17 3:24 PM

## 2017-11-06 LAB — BETA 2 MICROGLOBULIN, SERUM: Beta-2 Microglobulin: 9.9 mg/L — ABNORMAL HIGH (ref 0.6–2.4)

## 2017-11-07 ENCOUNTER — Emergency Department (HOSPITAL_COMMUNITY)
Admission: EM | Admit: 2017-11-07 | Discharge: 2017-11-08 | Disposition: A | Discharge status: Home / Self Care | Payer: 59 | Attending: Emergency Medicine | Admitting: Emergency Medicine

## 2017-11-07 ENCOUNTER — Other Ambulatory Visit: Payer: Self-pay

## 2017-11-07 ENCOUNTER — Encounter (HOSPITAL_COMMUNITY): Payer: Self-pay | Admitting: Emergency Medicine

## 2017-11-07 DIAGNOSIS — Z794 Long term (current) use of insulin: Secondary | ICD-10-CM | POA: Diagnosis not present

## 2017-11-07 DIAGNOSIS — N185 Chronic kidney disease, stage 5: Secondary | ICD-10-CM | POA: Diagnosis not present

## 2017-11-07 DIAGNOSIS — Z79899 Other long term (current) drug therapy: Secondary | ICD-10-CM | POA: Insufficient documentation

## 2017-11-07 DIAGNOSIS — I132 Hypertensive heart and chronic kidney disease with heart failure and with stage 5 chronic kidney disease, or end stage renal disease: Secondary | ICD-10-CM | POA: Diagnosis not present

## 2017-11-07 DIAGNOSIS — M5431 Sciatica, right side: Secondary | ICD-10-CM | POA: Insufficient documentation

## 2017-11-07 DIAGNOSIS — E1122 Type 2 diabetes mellitus with diabetic chronic kidney disease: Secondary | ICD-10-CM | POA: Insufficient documentation

## 2017-11-07 DIAGNOSIS — R1031 Right lower quadrant pain: Secondary | ICD-10-CM | POA: Diagnosis present

## 2017-11-07 DIAGNOSIS — I5032 Chronic diastolic (congestive) heart failure: Secondary | ICD-10-CM | POA: Diagnosis not present

## 2017-11-07 DIAGNOSIS — Z7982 Long term (current) use of aspirin: Secondary | ICD-10-CM | POA: Diagnosis not present

## 2017-11-07 LAB — CBC
HCT: 29.5 % — ABNORMAL LOW (ref 36.0–46.0)
HEMOGLOBIN: 8.7 g/dL — AB (ref 12.0–15.0)
MCH: 21.9 pg — AB (ref 26.0–34.0)
MCHC: 29.5 g/dL — ABNORMAL LOW (ref 30.0–36.0)
MCV: 74.3 fL — AB (ref 78.0–100.0)
Platelets: 490 10*3/uL — ABNORMAL HIGH (ref 150–400)
RBC: 3.97 MIL/uL (ref 3.87–5.11)
RDW: 20.2 % — ABNORMAL HIGH (ref 11.5–15.5)
WBC: 10.3 10*3/uL (ref 4.0–10.5)

## 2017-11-07 LAB — URINALYSIS, ROUTINE W REFLEX MICROSCOPIC
Bilirubin Urine: NEGATIVE
Glucose, UA: 150 mg/dL — AB
Hgb urine dipstick: NEGATIVE
Ketones, ur: NEGATIVE mg/dL
Leukocytes, UA: NEGATIVE
Nitrite: NEGATIVE
Protein, ur: >=300 mg/dL — AB
SPECIFIC GRAVITY, URINE: 1.017 (ref 1.005–1.030)
pH: 5 (ref 5.0–8.0)

## 2017-11-07 LAB — BASIC METABOLIC PANEL
ANION GAP: 10 (ref 5–15)
BUN: 75 mg/dL — ABNORMAL HIGH (ref 8–23)
CHLORIDE: 104 mmol/L (ref 98–111)
CO2: 26 mmol/L (ref 22–32)
Calcium: 9.1 mg/dL (ref 8.9–10.3)
Creatinine, Ser: 3.32 mg/dL — ABNORMAL HIGH (ref 0.44–1.00)
GFR calc non Af Amer: 14 mL/min — ABNORMAL LOW (ref >60)
GFR, EST AFRICAN AMERICAN: 16 mL/min — AB (ref >60)
Glucose, Bld: 208 mg/dL — ABNORMAL HIGH (ref 70–99)
Potassium: 4.7 mmol/L (ref 3.5–5.1)
Sodium: 140 mmol/L (ref 135–145)

## 2017-11-07 NOTE — ED Triage Notes (Signed)
Pt c/o right flank pain that radiates down to ankle x 1 day

## 2017-11-08 ENCOUNTER — Emergency Department (HOSPITAL_COMMUNITY): Payer: 59

## 2017-11-08 ENCOUNTER — Other Ambulatory Visit: Payer: Self-pay | Admitting: Family Medicine

## 2017-11-08 LAB — PROTEIN ELECTROPHORESIS, SERUM
A/G RATIO SPE: 0.8 (ref 0.7–1.7)
ALBUMIN ELP: 3 g/dL (ref 2.9–4.4)
ALPHA-2-GLOBULIN: 1 g/dL (ref 0.4–1.0)
Alpha-1-Globulin: 0.2 g/dL (ref 0.0–0.4)
Beta Globulin: 1.6 g/dL — ABNORMAL HIGH (ref 0.7–1.3)
GLOBULIN, TOTAL: 3.8 g/dL (ref 2.2–3.9)
Gamma Globulin: 1 g/dL (ref 0.4–1.8)
M-Spike, %: 0.4 g/dL — ABNORMAL HIGH
Total Protein ELP: 6.8 g/dL (ref 6.0–8.5)

## 2017-11-08 LAB — KAPPA/LAMBDA LIGHT CHAINS
KAPPA FREE LGHT CHN: 186 mg/L — AB (ref 3.3–19.4)
Kappa, lambda light chain ratio: 1.76 — ABNORMAL HIGH (ref 0.26–1.65)
Lambda free light chains: 105.8 mg/L — ABNORMAL HIGH (ref 5.7–26.3)

## 2017-11-08 LAB — IMMUNOFIXATION ELECTROPHORESIS
IGA: 438 mg/dL — AB (ref 87–352)
IGG (IMMUNOGLOBIN G), SERUM: 1222 mg/dL (ref 700–1600)
IgM (Immunoglobulin M), Srm: 63 mg/dL (ref 26–217)
Total Protein ELP: 6.7 g/dL (ref 6.0–8.5)

## 2017-11-08 MED ORDER — PREDNISONE 10 MG PO TABS
60.0000 mg | ORAL_TABLET | Freq: Once | ORAL | Status: AC
  Administered 2017-11-08: 60 mg via ORAL
  Filled 2017-11-08: qty 1

## 2017-11-08 MED ORDER — HYDROMORPHONE HCL 1 MG/ML IJ SOLN
1.0000 mg | Freq: Once | INTRAMUSCULAR | Status: AC
  Administered 2017-11-08: 1 mg via INTRAMUSCULAR
  Filled 2017-11-08: qty 1

## 2017-11-08 MED ORDER — DIAZEPAM 5 MG PO TABS
5.0000 mg | ORAL_TABLET | Freq: Once | ORAL | Status: AC
  Administered 2017-11-08: 5 mg via ORAL
  Filled 2017-11-08: qty 1

## 2017-11-08 MED ORDER — PREDNISONE 50 MG PO TABS: ORAL_TABLET | ORAL | 0 refills | Status: DC

## 2017-11-08 NOTE — ED Notes (Signed)
Ambulated pt with walker,pt moved slow and steady.
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
## 2017-11-08 NOTE — Discharge Instructions (Signed)
Watch your blood sugar closely while taking the steroids.  He may need to increase your insulin.  Take the pain medication that you have at home but use caution with the Tylenol (acetaminophen) component as you should not have more than 3000 total milligrams of acetaminophen per day. Follow-up with your doctor.  Return to the ED if you have worsening pain, weakness, numbness, bowel or bladder incontinence, fever or any other concerns.

## 2017-11-09 ENCOUNTER — Ambulatory Visit (INDEPENDENT_AMBULATORY_CARE_PROVIDER_SITE_OTHER): Payer: 59 | Admitting: Family Medicine

## 2017-11-09 ENCOUNTER — Encounter: Payer: Self-pay | Admitting: Family Medicine

## 2017-11-09 VITALS — BP 178/88 | HR 91 | Resp 16 | Ht 64.0 in | Wt 193.0 lb

## 2017-11-09 DIAGNOSIS — M5441 Lumbago with sciatica, right side: Secondary | ICD-10-CM | POA: Diagnosis not present

## 2017-11-09 DIAGNOSIS — I10 Essential (primary) hypertension: Secondary | ICD-10-CM

## 2017-11-09 DIAGNOSIS — Z794 Long term (current) use of insulin: Secondary | ICD-10-CM

## 2017-11-09 DIAGNOSIS — Z23 Encounter for immunization: Secondary | ICD-10-CM

## 2017-11-09 DIAGNOSIS — E782 Mixed hyperlipidemia: Secondary | ICD-10-CM

## 2017-11-09 DIAGNOSIS — E279 Disorder of adrenal gland, unspecified: Secondary | ICD-10-CM

## 2017-11-09 DIAGNOSIS — E1165 Type 2 diabetes mellitus with hyperglycemia: Secondary | ICD-10-CM

## 2017-11-09 DIAGNOSIS — E1065 Type 1 diabetes mellitus with hyperglycemia: Secondary | ICD-10-CM | POA: Diagnosis not present

## 2017-11-09 DIAGNOSIS — E278 Other specified disorders of adrenal gland: Secondary | ICD-10-CM | POA: Insufficient documentation

## 2017-11-09 DIAGNOSIS — IMO0002 Reserved for concepts with insufficient information to code with codable children: Secondary | ICD-10-CM

## 2017-11-09 DIAGNOSIS — E1121 Type 2 diabetes mellitus with diabetic nephropathy: Secondary | ICD-10-CM

## 2017-11-09 LAB — URINE CULTURE: Culture: <10000 — AB

## 2017-11-09 MED ORDER — AMLODIPINE BESYLATE 5 MG PO TABS: 5.0000 mg | ORAL_TABLET | Freq: Every day | ORAL | 5 refills | Status: DC

## 2017-11-09 NOTE — Patient Instructions (Addendum)
F/U early October, call if you need me before  Flu vaccine today  Please calll Dr Monna Fam for appointment, and remember to discuss adrenal mass ( get tele number)  Add amlodipine 5 mg one  Daily for blood pressure start tomorrow  Fasting lipid, hBA1C tomorrow and I will request labs be sent to your endocrine Doc as well  Thankful better for acute scuiatic pain, take all meds as diirected  Blood sugar seems improved and YOU ALSO!!!

## 2017-11-09 NOTE — Progress Notes (Signed)
Kathryn Beck     MRN: [id number]      DOB: 1955-08-30   HPI Kathryn Beck is here for follow up of recent ED visit 2 days ago when she was evaluated for disabling severe back pain radiating to RLE. 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 She reports improved blood sugars, and states feels better overall ROS Denies recent fever or chills. Denies sinus pressure, nasal congestion, ear pain or sore throat. Denies chest congestion, productive cough or wheezing. Denies chest pains, palpitations and leg swelling Denies abdominal pain, nausea, vomiting,diarrhea or constipation.   Denies dysuria, frequency, hesitancy or incontinence.  Denies headaches, seizures, numbness, or tingling. Denies depression, anxiety or insomnia. Denies skin break down or rash.   PE  BP (!) 178/88   Pulse 91   Resp 16   Ht 5\' 4"  (1.626 m)   Wt 193 lb (87.5 kg)   SpO2 98%   BMI 33.13 kg/m   Patient alert and oriented and in no cardiopulmonary distress.Pt in pain  HEENT: No facial asymmetry, EOMI,   oropharynx pink and moist.  Neck supple no JVD, no mass.  Chest: Clear to auscultation bilaterally.  CVS: S1, S2 no murmurs, no S3.Regular rate.  ABD: Soft non tender.   Ext: No edema  [id number]  ROM lumbar spine, adequate in shoulders, hips and knees.  Skin: Intact, no ulcerations or rash noted.  Psych: Good eye contact, normal affect. Memory intact not anxious or depressed appearing.  CNS: CN 2-12 intact, power,  normal throughout.no focal deficits noted.   Assessment & Plan  Adrenal mass, left ([place]) Noted since 2003, slowly enlarging needs endo f/u will refer  Malignant hypertension Uncontrolled ,ad amlodipine 5 mg  DASH diet and commitment to daily physical  activity for a minimum of 30 minutes discussed and encouraged, as a part of hypertension management. The importance of attaining a healthy weight is also discussed.  BP/Weight 11/26/2017 11/18/2017 11/10/2017 11/09/2017 11/08/2017 11/07/2017 4/48/1856  Systolic BP 314 970 263 785 885 - 027  Diastolic BP 74 73 80 88 79 - 73  Wt. (Lbs) - 199 186 193 - 184 -  BMI - 34.16 31.93 33.13 - 31.58 -       Acute back pain with sciatica, right F/u from recent ED visit, improved, continue tramadol for use for pain  Uncontrolled diabetes mellitus with diabetic nephropathy, with long-term current use of insulin (HCC) Uncontrolled , needs to schedule f/u with endo, has been hospitalized for a long period , then was in SNF. Blood sugar is improving , but still not at goal Kathryn Beck is reminded of the importance of commitment to daily physical activity for 30 minutes or more, as able and the need to limit carbohydrate intake to 30 to 60 grams per meal to help with blood sugar control.   The need to take medication as prescribed, test blood sugar as directed, and to call between visits if there is a concern that blood sugar is uncontrolled is also discussed.   Kathryn Beck is reminded of the importance of daily foot exam, annual eye examination, and good blood sugar, blood pressure and cholesterol control.  Diabetic Labs Latest Ref Rng & Units 11/26/2017 11/18/2017 11/12/2017 11/07/2017 10/15/2017  HbA1c <5.7 % of total Hgb - -  8.3(H) - -  Microalbumin Not estab mg/dL - - - - -  Micro/Creat Ratio <30 mcg/mg creat - - - - -  Chol <200 mg/dL - - 198 - -  HDL >50 mg/dL - - 48(L) - -  Calc LDL mg/dL (calc) - - 109(H) - -  Triglycerides <150 mg/dL - - 305(H) - -  Creatinine 0.44 - 1.00 mg/dL 3.10(H) 2.88(H) - 3.32(H) 2.98(H)   BP/Weight 11/26/2017 11/18/2017 11/10/2017 11/09/2017 11/08/2017 11/07/2017 1/61/0960  Systolic BP 454 098 119 147 829 - 562  Diastolic BP 74 73 80 88 79 - 73  Wt. (Lbs) - 199 186 193 - 184 -  BMI  - 34.16 31.93 33.13 - 31.58 -   Foot/eye exam completion dates Latest Ref Rng & Units 08/24/2016 01/13/2016  Eye Exam No Retinopathy - No Retinopathy  Foot Form Completion - Done -         Morbid obesity (HCC) Deteriorated. Patient re-educated about  the importance of commitment to a  minimum of 150 minutes of exercise per week.  The importance of healthy food choices with portion control discussed. Encouraged to start a food diary, count calories and to consider  joining a support group. Sample diet sheets offered. Goals set by the patient for the next several months.   Weight /BMI 11/18/2017 11/10/2017 11/09/2017  WEIGHT 199 lb 186 lb 193 lb  HEIGHT - 5\' 4"  5\' 4"   BMI 34.16 kg/m2 31.93 kg/m2 33.13 kg/m2

## 2017-11-09 NOTE — Assessment & Plan Note (Signed)
Noted since 2003, slowly enlarging needs endo f/u will refer

## 2017-11-10 ENCOUNTER — Ambulatory Visit (INDEPENDENT_AMBULATORY_CARE_PROVIDER_SITE_OTHER): Payer: 59 | Admitting: Orthopedic Surgery

## 2017-11-10 VITALS — BP 148/80 | HR 80 | Ht 64.0 in | Wt 186.0 lb

## 2017-11-10 DIAGNOSIS — M7551 Bursitis of right shoulder: Secondary | ICD-10-CM | POA: Diagnosis not present

## 2017-11-10 NOTE — Progress Notes (Signed)
Chief Complaint  Patient presents with  . Follow-up    Recheck on right shoulder.     62 history of pain right shoulder treated with injection for possible bursitis comes in pain-free  She has regained her range of motion she has no tenderness  Encounter Diagnosis  Name Primary?  . Bursitis of right shoulder Yes    Follow-up as needed

## 2017-11-12 ENCOUNTER — Ambulatory Visit (HOSPITAL_COMMUNITY): Payer: Self-pay

## 2017-11-12 ENCOUNTER — Other Ambulatory Visit (HOSPITAL_COMMUNITY): Payer: Self-pay

## 2017-11-13 LAB — LIPID PANEL
CHOLESTEROL: 198 mg/dL (ref <200)
HDL: 48 mg/dL — ABNORMAL LOW (ref >50)
LDL Cholesterol (Calc): 109 mg/dL (calc) — ABNORMAL HIGH
Non-HDL Cholesterol (Calc): 150 mg/dL (calc) — ABNORMAL HIGH (ref <130)
Total CHOL/HDL Ratio: 4.1 (calc) (ref <5.0)
Triglycerides: 305 mg/dL — ABNORMAL HIGH (ref <150)

## 2017-11-13 LAB — HEMOGLOBIN A1C
Hgb A1c MFr Bld: 8.3 % of total Hgb — ABNORMAL HIGH (ref <5.7)
Mean Plasma Glucose: 192 (calc)
eAG (mmol/L): 10.6 (calc)

## 2017-11-18 ENCOUNTER — Inpatient Hospital Stay (HOSPITAL_BASED_OUTPATIENT_CLINIC_OR_DEPARTMENT_OTHER): Payer: 59 | Admitting: Hematology

## 2017-11-18 ENCOUNTER — Other Ambulatory Visit: Payer: Self-pay

## 2017-11-18 ENCOUNTER — Encounter (HOSPITAL_COMMUNITY): Payer: Self-pay | Admitting: Hematology

## 2017-11-18 ENCOUNTER — Inpatient Hospital Stay (HOSPITAL_COMMUNITY): Payer: 59

## 2017-11-18 VITALS — BP 182/73 | HR 81 | Temp 98.1°F | Resp 18 | Wt 199.0 lb

## 2017-11-18 DIAGNOSIS — I5032 Chronic diastolic (congestive) heart failure: Secondary | ICD-10-CM

## 2017-11-18 DIAGNOSIS — D472 Monoclonal gammopathy: Secondary | ICD-10-CM

## 2017-11-18 DIAGNOSIS — K5900 Constipation, unspecified: Secondary | ICD-10-CM

## 2017-11-18 DIAGNOSIS — N184 Chronic kidney disease, stage 4 (severe): Secondary | ICD-10-CM

## 2017-11-18 DIAGNOSIS — Z79899 Other long term (current) drug therapy: Secondary | ICD-10-CM

## 2017-11-18 DIAGNOSIS — E1122 Type 2 diabetes mellitus with diabetic chronic kidney disease: Secondary | ICD-10-CM

## 2017-11-18 DIAGNOSIS — M129 Arthropathy, unspecified: Secondary | ICD-10-CM

## 2017-11-18 DIAGNOSIS — M25511 Pain in right shoulder: Secondary | ICD-10-CM

## 2017-11-18 DIAGNOSIS — R5383 Other fatigue: Secondary | ICD-10-CM

## 2017-11-18 DIAGNOSIS — D509 Iron deficiency anemia, unspecified: Secondary | ICD-10-CM

## 2017-11-18 DIAGNOSIS — K219 Gastro-esophageal reflux disease without esophagitis: Secondary | ICD-10-CM

## 2017-11-18 DIAGNOSIS — I132 Hypertensive heart and chronic kidney disease with heart failure and with stage 5 chronic kidney disease, or end stage renal disease: Secondary | ICD-10-CM | POA: Diagnosis not present

## 2017-11-18 DIAGNOSIS — E669 Obesity, unspecified: Secondary | ICD-10-CM

## 2017-11-18 DIAGNOSIS — E782 Mixed hyperlipidemia: Secondary | ICD-10-CM

## 2017-11-18 DIAGNOSIS — Z8781 Personal history of (healed) traumatic fracture: Secondary | ICD-10-CM

## 2017-11-18 DIAGNOSIS — Z794 Long term (current) use of insulin: Secondary | ICD-10-CM

## 2017-11-18 DIAGNOSIS — M1990 Unspecified osteoarthritis, unspecified site: Secondary | ICD-10-CM

## 2017-11-18 DIAGNOSIS — D649 Anemia, unspecified: Secondary | ICD-10-CM

## 2017-11-18 DIAGNOSIS — G4730 Sleep apnea, unspecified: Secondary | ICD-10-CM

## 2017-11-18 DIAGNOSIS — M7989 Other specified soft tissue disorders: Secondary | ICD-10-CM

## 2017-11-18 LAB — RETICULOCYTES
RBC.: 4.23 MIL/uL (ref 3.87–5.11)
RETIC COUNT ABSOLUTE: 88.8 10*3/uL (ref 19.0–186.0)
Retic Ct Pct: 2.1 % (ref 0.4–3.1)

## 2017-11-18 LAB — COMPREHENSIVE METABOLIC PANEL
ALBUMIN: 2.9 g/dL — AB (ref 3.5–5.0)
ALT: 34 U/L (ref 0–44)
AST: 21 U/L (ref 15–41)
Alkaline Phosphatase: 130 U/L — ABNORMAL HIGH (ref 38–126)
Anion gap: 9 (ref 5–15)
BUN: 50 mg/dL — AB (ref 8–23)
CHLORIDE: 109 mmol/L (ref 98–111)
CO2: 26 mmol/L (ref 22–32)
Calcium: 9.4 mg/dL (ref 8.9–10.3)
Creatinine, Ser: 2.88 mg/dL — ABNORMAL HIGH (ref 0.44–1.00)
GFR calc Af Amer: 19 mL/min — ABNORMAL LOW (ref >60)
GFR calc non Af Amer: 17 mL/min — ABNORMAL LOW (ref >60)
GLUCOSE: 157 mg/dL — AB (ref 70–99)
POTASSIUM: 4.8 mmol/L (ref 3.5–5.1)
Sodium: 144 mmol/L (ref 135–145)
Total Bilirubin: 0.6 mg/dL (ref 0.3–1.2)
Total Protein: 7.3 g/dL (ref 6.5–8.1)

## 2017-11-18 LAB — CBC WITH DIFFERENTIAL/PLATELET
Basophils Absolute: 0 10*3/uL (ref 0.0–0.1)
Basophils Relative: 0 %
EOS PCT: 1 %
Eosinophils Absolute: 0.1 10*3/uL (ref 0.0–0.7)
HCT: 31.4 % — ABNORMAL LOW (ref 36.0–46.0)
Hemoglobin: 9.3 g/dL — ABNORMAL LOW (ref 12.0–15.0)
LYMPHS ABS: 2.8 10*3/uL (ref 0.7–4.0)
LYMPHS PCT: 34 %
MCH: 22 pg — ABNORMAL LOW (ref 26.0–34.0)
MCHC: 29.6 g/dL — ABNORMAL LOW (ref 30.0–36.0)
MCV: 74.2 fL — AB (ref 78.0–100.0)
MONO ABS: 0.7 10*3/uL (ref 0.1–1.0)
Monocytes Relative: 8 %
Neutro Abs: 4.8 10*3/uL (ref 1.7–7.7)
Neutrophils Relative %: 57 %
PLATELETS: 370 10*3/uL (ref 150–400)
RBC: 4.23 MIL/uL (ref 3.87–5.11)
RDW: 21 % — AB (ref 11.5–15.5)
WBC: 8.4 10*3/uL (ref 4.0–10.5)

## 2017-11-18 LAB — IRON AND TIBC
IRON: 57 ug/dL (ref 28–170)
SATURATION RATIOS: 21 % (ref 10.4–31.8)
TIBC: 271 ug/dL (ref 250–450)
UIBC: 214 ug/dL

## 2017-11-18 LAB — LACTATE DEHYDROGENASE: LDH: 276 U/L — AB (ref 98–192)

## 2017-11-18 LAB — FERRITIN: FERRITIN: 673 ng/mL — AB (ref 11–307)

## 2017-11-18 LAB — VITAMIN B12: VITAMIN B 12: 407 pg/mL (ref 180–914)

## 2017-11-18 NOTE — Patient Instructions (Signed)
[facility] at [facility] Discharge Instructions     Thank you for choosing [facility] at [facility] to provide your oncology and hematology care.  To afford each patient quality time with our provider, please arrive at least 15 minutes before your scheduled appointment time.   If you have a lab appointment with the Cancer Center please come in thru the  Main Entrance and check in at the main information desk  You need to re-schedule your appointment should you arrive 10 or more minutes late.  We strive to give you quality time with our providers, and arriving late affects you and other patients whose appointments are after yours.  Also, if you no show three or more times for appointments you may be dismissed from the clinic at the providers discretion.     Again, thank you for choosing [facility].  Our hope is that these requests will decrease the amount of time that you wait before being seen by our physicians.       _____________________________________________________________  Should you have questions after your visit to [facility], please contact our office at [phone] between the hours of 8:00 a.m. and 4:30 p.m.  Voicemails left after 4:00 p.m. will not be returned until the following business day.  For prescription refill requests, have your pharmacy contact our office and allow 72 hours.    Cancer Center Support Programs:   > Cancer Support Group  2nd Tuesday of the month 1pm-2pm, Journey Room    

## 2017-11-18 NOTE — Progress Notes (Signed)
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  CURRENT THERAPY: Observation for MGUS and procrit injections weekly and intermittent iron infusions   INTERVAL HISTORY:  [name] 62 y.o. female returns for routine follow-up for MGUS. Patient is here today with her daughter. Patient is fatiuged throughout the day. She feels weak and walks with a walker. She tries to remain active at home and performs all her own ADLs. Patient has intermittent constipation. She has numbness in her toes which is stable at this time. She reports her appetite at 100% and she is maintaining her weight. Her energy is 50%.    REVIEW OF SYSTEMS:  Review of Systems  Constitutional: Positive for fatigue.  Cardiovascular: Positive for leg swelling.  Gastrointestinal: Positive for constipation.  Skin: Positive for itching.  Neurological: Positive for extremity weakness and numbness (feet).  All other systems reviewed and are negative.    PAST MEDICAL/SURGICAL HISTORY:  Past Medical History:  Diagnosis Date  . Acid reflux   . Anemia   . Arthritis   . Axillary masses    Soft tissue - status post excision  . Back pain   . CKD (chronic kidney disease) stage 5, GFR less than 15 ml/min (HCC)   . Diastolic heart failure ([place])   . Essential hypertension   . Ischemic heart disease    Abnormal Myoview April 2018 - medical therapy  . Mixed hyperlipidemia   . Obesity   . Pancreatitis   . Sleep apnea    Noncompliant with CPAP  . Type 2 diabetes mellitus, uncontrolled ([place])    Past Surgical History:  Procedure Laterality Date  . ABDOMINAL HYSTERECTOMY    . AV FISTULA PLACEMENT Left 09/02/2017   Procedure: creation of left arm ARTERIOVENOUS (AV) FISTULA;  Surgeon: Serafina Mitchell, MD;  Location: [facility] OR;  Service: Vascular;  Laterality: Left;  . COLONOSCOPY  2008   Dr. Oneida Alar: normal   . COLONOSCOPY N/A 12/18/2016   Dr. Oneida Alar: multiple tubular adenomas, internal hemorrhoids. Surveillance in 3 years   . ESOPHAGEAL DILATION N/A 10/13/2015   Procedure: ESOPHAGEAL DILATION;  Surgeon: Rogene Houston, MD;  Location: [facility] SU[facility];  Service: Endoscopy;  Laterality: N/A;  . ESOPHAGOGASTRODUODENOSCOPY N/A 10/13/2015   Dr. Laural Golden: chronic gastritis on path, no H.pylori. Empiric dilation   . ESOPHAGOGASTRODUODENOSCOPY N/A 12/18/2016   Dr. Oneida Alar: mild gastritis. BRAVO study revealed uncontrolled GERD. Dysphagia secondary to uncontrolled reflux  . FOOT SURGERY Bilateral   . LUNG BIOPSY    . MASS EXCISION Right 01/09/2013   Procedure: EXCISION OF NEOPLASM OF RIGHT  AXILLA  AND EXCISION OF NEOPLASM OF LEFT AXILLA;  Surgeon: Jamesetta So, MD;  Location: [facility] ORS;  Service: General;  Laterality: Right;  procedure end @ 08:23  . MYRINGOTOMY WITH TUBE PLACEMENT Bilateral 04/28/2017   Procedure: BILATERAL MYRINGOTOMY WITH TUBE PLACEMENT;  Surgeon: Leta Baptist, MD;  Location: [facility];  Service: ENT;  Laterality: Bilateral;  . SAVORY DILATION N/A 12/18/2016   Procedure: SAVORY DILATION;  Surgeon: Danie Binder, MD;  Location: [facility] SUITE;  Service: Endoscopy;  Laterality: N/A;     SOCIAL HISTORY:  Social History   Socioeconomic History  . Marital status: Married    Spouse name: Not on file  .  Number of children: Not on file  . Years of education: Not on file  . Highest education level: Not on file  Occupational History  . Not on file  Social Needs  . Financial resource strain: Not on file  . Food insecurity:    Worry: Not on file    Inability: Not on file  . Transportation needs:    Medical: Not on file    Non-medical: Not on file  Tobacco Use  . Smoking status: Never Smoker  . Smokeless tobacco: Never Used  Substance and Sexual Activity  . Alcohol use: No  . Drug  use: No  . Sexual activity: Not Currently  Lifestyle  . Physical activity:    Days per week: Not on file    Minutes per session: Not on file  . Stress: Not on file  Relationships  . Social connections:    Talks on phone: Not on file    Gets together: Not on file    Attends religious service: Not on file    Active member of club or organization: Not on file    Attends meetings of clubs or organizations: Not on file    Relationship status: Not on file  . Intimate partner violence:    Fear of current or ex partner: Not on file    Emotionally abused: Not on file    Physically abused: Not on file    Forced sexual activity: Not on file  Other Topics Concern  . Not on file  Social History Narrative  . Not on file    FAMILY HISTORY:  Family History  Problem Relation Age of Onset  . Hypertension Father   . Hypercholesterolemia Father   . Arthritis Father   . Hypertension Sister   . Hypercholesterolemia Sister   . Breast cancer Sister   . Hypertension Sister   . Colon cancer Neg Hx   . Colon polyps Neg Hx     CURRENT MEDICATIONS:  Outpatient Encounter Medications as of 11/18/2017  Medication Sig  . acetaminophen (TYLENOL) 500 MG tablet Take by mouth.  Marland Kitchen acetaminophen-codeine (TYLENOL #3) 300-30 MG tablet Take 1 tablet by mouth every 6 (six) hours as needed for moderate pain.  Marland Kitchen amLODipine (NORVASC) 5 MG tablet Take 1 tablet (5 mg total) by mouth daily.  Marland Kitchen azelastine (ASTELIN) 0.1 % nasal spray Place 2 sprays into both nostrils 2 (two) times daily. Use in each nostril as directed  . calcitRIOL (ROCALTROL) 0.25 MCG capsule Take 0.25 mcg by mouth every Monday, Wednesday, and Friday.   . Calcium Carbonate Antacid (CALCIUM CARBONATE, DOSED IN MG ELEMENTAL CALCIUM,) 1250 MG/5ML SUSP Take 5 mLs (500 mg of elemental calcium total) by mouth every 6 (six) hours as needed for indigestion.  . camphor-menthol (SARNA) lotion Apply 1 application topically every 8 (eight) hours as needed for  itching.  . cloNIDine (CATAPRES) 0.2 MG tablet One tablet at bedtime every night for blood pressure at 9 pm  . dexlansoprazole (DEXILANT) 60 MG capsule TAKE 1 CAPSULE IN THE MORNING WITH FOOD  . dextromethorphan-guaiFENesin (MUCINEX DM) 30-600 MG 12hr tablet Take 1 tablet by mouth 2 (two) times daily.  Marland Kitchen ezetimibe (ZETIA) 10 MG tablet Take 1 tablet (10 mg total) by mouth daily.  Marland Kitchen gabapentin (NEURONTIN) 100 MG capsule TAKE (1) CAPSULE BY MOUTH TWICE DAILY.  Marland Kitchen HUMALOG KWIKPEN 100 UNIT/ML KiwkPen INJECT 8 UNITS WITH BREAKFAST LUNCH AND SUPPER IF SUGAR IS ABOVE 150.  . hydrochlorothiazide (MICROZIDE) 12.5 MG capsule Take 1  capsule (12.5 mg total) by mouth daily.  . hydrOXYzine (ATARAX/VISTARIL) 25 MG tablet Take 1 tablet (25 mg total) by mouth every 8 (eight) hours as needed for itching.  . insulin aspart (NOVOLOG) 100 UNIT/ML injection Inject 8 Units into the skin 3 (three) times daily with meals.  . insulin detemir (LEVEMIR) 100 UNIT/ML injection Inject 0.55 mLs (55 Units total) into the skin 2 (two) times daily.  . isosorbide mononitrate (IMDUR) 30 MG 24 hr tablet Take 1 tablet (30 mg total) by mouth daily.  . ondansetron (ZOFRAN ODT) 4 MG disintegrating tablet Take 1 tablet (4 mg total) by mouth every 8 (eight) hours as needed for nausea or vomiting.  . predniSONE (DELTASONE) 50 MG tablet 1 tablet PO daily  . senna-docusate (SENOKOT-S) 8.6-50 MG tablet Take 2 tablets by mouth at bedtime as needed for mild constipation or moderate constipation.  . sertraline (ZOLOFT) 25 MG tablet TAKE 1 TABLET BY MOUTH ONCE DAILY.  Marland Kitchen torsemide (DEMADEX) 20 MG tablet TAKE (2) TABLETS BY MOUTH ONCE DAILY AS NEEDED (EDEMA OR WEIGHT GAIN;3 POUNDS IN A DAY OR 5 POUNDS IN A WEEK).  . traMADol (ULTRAM) 50 MG tablet TAKE ONE TABLET BY MOUTH AT BEDTIME. MAY TAKE 1 TABLET UP TO EVERY 6 HOURS AS NEEDED FOR PAIN. (MAX OF 4 PER DAY)  . ULTICARE MINI PEN NEEDLES 31G X 6 MM MISC USE AS DIRECTED  . Vitamin D, Ergocalciferol,  (DRISDOL) 50000 units CAPS capsule Take 1 capsule (50,000 Units total) by mouth every 7 (seven) days.  . [DISCONTINUED] FLUoxetine (PROZAC) 10 MG capsule Take 10 mg by mouth daily.    . [DISCONTINUED] glipiZIDE (GLUCOTROL) 10 MG tablet Take 10 mg by mouth 2 (two) times daily before a meal.     No facility-administered encounter medications on file as of 11/18/2017.     ALLERGIES:  Allergies  Allergen Reactions  . Ace Inhibitors Anaphylaxis and Swelling  . Penicillins Itching, Swelling and Other (See Comments)    SWELLING REACTION UNSPECIFIED   PATIENT HAS HAD A PCN REACTION WITH IMMEDIATE RASH, FACIAL/TONGUE/THROAT SWELLING, SOB, OR LIGHTHEADEDNESS WITH HYPOTENSION:  #  #  YES  #  #  Has patient had a PCN reaction causing severe rash involving mucus membranes or skin necrosis: No Has patient had a PCN reaction that required hospitalization No Has patient had a PCN reaction occurring within the last 10 years: No If all of the above answers are "NO", then may proceed with Cephalosporin use.   . Statins Other (See Comments)    elevated LFT's  . Albuterol Swelling    SWELLING REACTION UNSPECIFIED      PHYSICAL EXAM:  ECOG Performance status: 1  Vitals:   11/18/17 1014  BP: (!) 182/73  Pulse: 81  Resp: 18  Temp: 98.1 F (36.7 C)  SpO2: 100%   Filed Weights   11/18/17 1014  Weight: 199 lb (90.3 kg)    Physical Exam  Constitutional: She is oriented to person, place, and time. She appears well-developed and well-nourished.  Cardiovascular: Normal rate, regular rhythm and normal heart sounds.  Pulmonary/Chest: Effort normal and breath sounds normal.  Neurological: She is alert and oriented to person, place, and time.  Skin: Skin is warm and dry.     LABORATORY DATA:  I have reviewed the labs as listed.  CBC    Component Value Date/Time   WBC 8.4 11/18/2017 1109   RBC 4.23 11/18/2017 1109   RBC 4.23 11/18/2017 1109   HGB 9.3 (  L) 11/18/2017 1109   HCT 31.4 (L)  11/18/2017 1109   PLT 370 11/18/2017 1109   MCV 74.2 (L) 11/18/2017 1109   MCH 22.0 (L) 11/18/2017 1109   MCHC 29.6 (L) 11/18/2017 1109   RDW 21.0 (H) 11/18/2017 1109   LYMPHSABS 2.8 11/18/2017 1109   MONOABS 0.7 11/18/2017 1109   EOSABS 0.1 11/18/2017 1109   BASOSABS 0.0 11/18/2017 1109   CMP Latest Ref Rng & Units 11/18/2017 11/07/2017 10/15/2017  Glucose 70 - 99 mg/dL 157(H) 208(H) 166(H)  BUN 8 - 23 mg/dL 50(H) 75(H) 49(H)  Creatinine 0.44 - 1.00 mg/dL 2.88(H) 3.32(H) 2.98(H)  Sodium 135 - 145 mmol/L 144 140 143  Potassium 3.5 - 5.1 mmol/L 4.8 4.7 4.5  Chloride 98 - 111 mmol/L 109 104 109  CO2 22 - 32 mmol/L 26 26 24   Calcium 8.9 - 10.3 mg/dL 9.4 9.1 9.4  Total Protein 6.5 - 8.1 g/dL 7.3 - -  Total Bilirubin 0.3 - 1.2 mg/dL 0.6 - -  Alkaline Phos 38 - 126 U/L 130(H) - -  AST 15 - 41 U/L 21 - -  ALT 0 - 44 U/L 34 - -       DIAGNOSTIC IMAGING:  I have independently reviewed images of the x-rays.     ASSESSMENT & PLAN:   MGUS (monoclonal gammopathy of unknown significance) 1.  Monoclonal gammopathy of uncertain significance: - Patient with history of CKD stage IV, work-up for monoclonal gammopathy on 08/09/2017 showed an M spike of 0.1 g/dL of IgG kappa monoclonal protein.  Free kappa light chains were elevated at 171 and lambda light chains 75.7.  Ratio was 2.27.  24-hour urine showed nephrotic range proteinuria.  This was 18 g.  M spike was positive. -Patient had a recent admission to the hospital with pneumonia, spent a month in rehab, currently at home for the past 1 month.  Denies any new onset pains except right shoulder pain for which she had a shot. - Currently walks with the help of walker at home.  Denies any B symptoms including fevers, night sweats or weight loss. - Today we discussed the results of the blood work done on 11/05/2017.  M spike was 0.4, up from 0.2 on prior testing. -Serum immunofixation shows polyclonal gammopathy with IgA kappa and lambda typing  appear increased. - Free light chain ratio is 1.76, down from 2.04 at last visit.  LDH was elevated at 290.  Beta-2 microglobulin was elevated at 9.9. -I have reviewed the results of the skeletal survey which did not reveal any lytic lesions. -I plan to repeat SPEP, SIFX and FLC in 3 to 4 months.  2.  CKD: Thought to be secondary to long history of diabetes, hypertension, obesity related glomerulopathy. 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 - Creatinine is more or less stable.  3.  Microcytic anemia: - She did receive 2 units of PRBC during hospitalization in June.  She also received Feraheme at that time. -Today hemoglobin is 8.7 with an MCV of 74.3.  LDH was elevated at 290.  We will do iron panel and ferritin. - Patient would like to get iron and Procrit infusions at our office.  We have called her insurance company and got prior authorization for both. -We will schedule her for Feraheme x1 and Procrit injections at 20,000 units weekly.  Will titrated up as needed.  We discussed side effects of Feraheme and Procrit in detail. -I will see her back in 2  months for follow-up with repeat labs.       Orders placed this encounter:  Orders Placed This Encounter  Procedures  . CBC with Differential/Platelet  . Comprehensive metabolic panel  . Lactate dehydrogenase  . Reticulocytes  . Iron and TIBC  . Ferritin  . Vitamin B12  . Lactate dehydrogenase  . Reticulocytes  . CBC with Differential/Platelet  . Comprehensive metabolic panel  . Ferritin  . Iron and TIBC  . Vitamin B12  . Protein electrophoresis, serum  . Kappa/lambda light chains      Derek Jack, [place] [phone]

## 2017-11-18 NOTE — Assessment & Plan Note (Signed)
1.  Monoclonal gammopathy of uncertain significance: - Patient with history of CKD stage IV, work-up for monoclonal gammopathy on 08/09/2017 showed an M spike of 0.1 g/dL of IgG kappa monoclonal protein.  Free kappa light chains were elevated at 171 and lambda light chains 75.7.  Ratio was 2.27.  24-hour urine showed nephrotic range proteinuria.  This was 18 g.  M spike was positive. -Patient had a recent admission to the hospital with pneumonia, spent a month in rehab, currently at home for the past 1 month.  Denies any new onset pains except right shoulder pain for which she had a shot. - Currently walks with the help of walker at home.  Denies any B symptoms including fevers, night sweats or weight loss. - Today we discussed the results of the blood work done on 11/05/2017.  M spike was 0.4, up from 0.2 on prior testing. -Serum immunofixation shows polyclonal gammopathy with IgA kappa and lambda typing appear increased. - Free light chain ratio is 1.76, down from 2.04 at last visit.  LDH was elevated at 290.  Beta-2 microglobulin was elevated at 9.9. -I have reviewed the results of the skeletal survey which did not reveal any lytic lesions. -I plan to repeat SPEP, SIFX and FLC in 3 to 4 months.  2.  CKD: Thought to be secondary to long history of diabetes, hypertension, obesity related glomerulopathy. 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 - Creatinine is more or less stable.  3.  Microcytic anemia: - She did receive 2 units of PRBC during hospitalization in June.  She also received Feraheme at that time. -Today hemoglobin is 8.7 with an MCV of 74.3.  LDH was elevated at 290.  We will do iron panel and ferritin. - Patient would like to get iron and Procrit infusions at our office.  We have called her insurance company and got prior authorization for both. -We will schedule her for Feraheme x1 and Procrit injections at 20,000 units weekly.  Will titrated up as needed.  We  discussed side effects of Feraheme and Procrit in detail. -I will see her back in 2 months for follow-up with repeat labs.

## 2017-11-23 ENCOUNTER — Other Ambulatory Visit: Payer: Self-pay | Admitting: Family Medicine

## 2017-11-24 ENCOUNTER — Other Ambulatory Visit: Payer: Self-pay | Admitting: Family Medicine

## 2017-11-24 ENCOUNTER — Other Ambulatory Visit (HOSPITAL_COMMUNITY): Payer: Self-pay

## 2017-11-24 DIAGNOSIS — D472 Monoclonal gammopathy: Secondary | ICD-10-CM

## 2017-11-26 ENCOUNTER — Inpatient Hospital Stay (HOSPITAL_COMMUNITY): Payer: 59 | Attending: Hematology

## 2017-11-26 ENCOUNTER — Other Ambulatory Visit (HOSPITAL_COMMUNITY): Payer: Self-pay

## 2017-11-26 ENCOUNTER — Inpatient Hospital Stay (HOSPITAL_COMMUNITY): Payer: 59

## 2017-11-26 ENCOUNTER — Encounter (HOSPITAL_COMMUNITY): Payer: Self-pay

## 2017-11-26 ENCOUNTER — Other Ambulatory Visit: Payer: Self-pay | Admitting: Family Medicine

## 2017-11-26 VITALS — BP 174/74 | HR 72 | Temp 97.5°F | Resp 18

## 2017-11-26 DIAGNOSIS — D472 Monoclonal gammopathy: Secondary | ICD-10-CM

## 2017-11-26 DIAGNOSIS — Z79899 Other long term (current) drug therapy: Secondary | ICD-10-CM | POA: Diagnosis not present

## 2017-11-26 DIAGNOSIS — N184 Chronic kidney disease, stage 4 (severe): Secondary | ICD-10-CM | POA: Insufficient documentation

## 2017-11-26 DIAGNOSIS — I129 Hypertensive chronic kidney disease with stage 1 through stage 4 chronic kidney disease, or unspecified chronic kidney disease: Secondary | ICD-10-CM | POA: Insufficient documentation

## 2017-11-26 DIAGNOSIS — D631 Anemia in chronic kidney disease: Secondary | ICD-10-CM | POA: Insufficient documentation

## 2017-11-26 DIAGNOSIS — D638 Anemia in other chronic diseases classified elsewhere: Secondary | ICD-10-CM

## 2017-11-26 LAB — COMPREHENSIVE METABOLIC PANEL
ALK PHOS: 121 U/L (ref 38–126)
ALT: 34 U/L (ref 0–44)
AST: 18 U/L (ref 15–41)
Albumin: 2.9 g/dL — ABNORMAL LOW (ref 3.5–5.0)
Anion gap: 8 (ref 5–15)
BILIRUBIN TOTAL: 0.6 mg/dL (ref 0.3–1.2)
BUN: 51 mg/dL — AB (ref 8–23)
CHLORIDE: 110 mmol/L (ref 98–111)
CO2: 24 mmol/L (ref 22–32)
CREATININE: 3.1 mg/dL — AB (ref 0.44–1.00)
Calcium: 8.9 mg/dL (ref 8.9–10.3)
GFR calc Af Amer: 18 mL/min — ABNORMAL LOW (ref >60)
GFR, EST NON AFRICAN AMERICAN: 15 mL/min — AB (ref >60)
Glucose, Bld: 233 mg/dL — ABNORMAL HIGH (ref 70–99)
Potassium: 4.5 mmol/L (ref 3.5–5.1)
Sodium: 142 mmol/L (ref 135–145)
Total Protein: 6.8 g/dL (ref 6.5–8.1)

## 2017-11-26 LAB — CBC WITH DIFFERENTIAL/PLATELET
BASOS ABS: 0 10*3/uL (ref 0.0–0.1)
Basophils Relative: 0 %
EOS PCT: 1 %
Eosinophils Absolute: 0.1 10*3/uL (ref 0.0–0.7)
HEMATOCRIT: 30.5 % — AB (ref 36.0–46.0)
Hemoglobin: 9 g/dL — ABNORMAL LOW (ref 12.0–15.0)
LYMPHS ABS: 2.4 10*3/uL (ref 0.7–4.0)
LYMPHS PCT: 31 %
MCH: 22 pg — AB (ref 26.0–34.0)
MCHC: 29.5 g/dL — AB (ref 30.0–36.0)
MCV: 74.6 fL — AB (ref 78.0–100.0)
MONOS PCT: 9 %
Monocytes Absolute: 0.7 10*3/uL (ref 0.1–1.0)
NEUTROS ABS: 4.5 10*3/uL (ref 1.7–7.7)
Neutrophils Relative %: 59 %
PLATELETS: 344 10*3/uL (ref 150–400)
RBC: 4.09 MIL/uL (ref 3.87–5.11)
RDW: 21 % — ABNORMAL HIGH (ref 11.5–15.5)
WBC: 7.8 10*3/uL (ref 4.0–10.5)

## 2017-11-26 LAB — IRON AND TIBC
IRON: 66 ug/dL (ref 28–170)
SATURATION RATIOS: 24 % (ref 10.4–31.8)
TIBC: 274 ug/dL (ref 250–450)
UIBC: 208 ug/dL

## 2017-11-26 LAB — LACTATE DEHYDROGENASE: LDH: 280 U/L — ABNORMAL HIGH (ref 98–192)

## 2017-11-26 LAB — FERRITIN: Ferritin: 576 ng/mL — ABNORMAL HIGH (ref 11–307)

## 2017-11-26 MED ORDER — EPOETIN ALFA 20000 UNIT/ML IJ SOLN
20000.0000 [IU] | Freq: Once | INTRAMUSCULAR | Status: AC
  Administered 2017-11-26: 20000 [IU] via SUBCUTANEOUS
  Filled 2017-11-26: qty 1

## 2017-11-26 MED ORDER — FULVESTRANT 250 MG/5ML IM SOLN
INTRAMUSCULAR | Status: AC
  Filled 2017-11-26: qty 5

## 2017-11-26 NOTE — Patient Instructions (Signed)
[facility] at [facility] Discharge Instructions  Received Procrit injection today. Follow-up as scheduled. Call clinic for any questions or concerns   Thank you for choosing [facility] at [facility] to provide your oncology and hematology care.  To afford each patient quality time with our provider, please arrive at least 15 minutes before your scheduled appointment time.   If you have a lab appointment with the Cancer Center please come in thru the  Main Entrance and check in at the main information desk  You need to re-schedule your appointment should you arrive 10 or more minutes late.  We strive to give you quality time with our providers, and arriving late affects you and other patients whose appointments are after yours.  Also, if you no show three or more times for appointments you may be dismissed from the clinic at the providers discretion.     Again, thank you for choosing [facility].  Our hope is that these requests will decrease the amount of time that you wait before being seen by our physicians.       _____________________________________________________________  Should you have questions after your visit to [facility], please contact our office at [phone] between the hours of 8:00 a.m. and 4:30 p.m.  Voicemails left after 4:00 p.m. will not be returned until the following business day.  For prescription refill requests, have your pharmacy contact our office and allow 72 hours.    Cancer Center Support Programs:   > Cancer Support Group  2nd Tuesday of the month 1pm-2pm, Journey Room   

## 2017-11-26 NOTE — Progress Notes (Signed)
Kathryn Beck tolerated Procrit injection well without complaints or incident. Hgb 9 today. VSS Pt discharged self ambulatory using a cane in satisfactory condition accompanied by a family member

## 2017-11-27 ENCOUNTER — Encounter: Payer: Self-pay | Admitting: Family Medicine

## 2017-11-27 NOTE — Assessment & Plan Note (Signed)
Uncontrolled , needs to schedule f/u with endo, has been hospitalized for a long period , then was in SNF. Blood sugar is improving , but still not at goal Kathryn Beck is reminded of the importance of commitment to daily physical activity for 30 minutes or more, as able and the need to limit carbohydrate intake to 30 to 60 grams per meal to help with blood sugar control.   The need to take medication as prescribed, test blood sugar as directed, and to call between visits if there is a concern that blood sugar is uncontrolled is also discussed.   Kathryn Beck is reminded of the importance of daily foot exam, annual eye examination, and good blood sugar, blood pressure and cholesterol control.  Diabetic Labs Latest Ref Rng & Units 11/26/2017 11/18/2017 11/12/2017 11/07/2017 10/15/2017  HbA1c <5.7 % of total Hgb - - 8.3(H) - -  Microalbumin Not estab mg/dL - - - - -  Micro/Creat Ratio <30 mcg/mg creat - - - - -  Chol <200 mg/dL - - 198 - -  HDL >50 mg/dL - - 48(L) - -  Calc LDL mg/dL (calc) - - 109(H) - -  Triglycerides <150 mg/dL - - 305(H) - -  Creatinine 0.44 - 1.00 mg/dL 3.10(H) 2.88(H) - 3.32(H) 2.98(H)   BP/Weight 11/26/2017 11/18/2017 11/10/2017 11/09/2017 11/08/2017 11/07/2017 9/62/9528  Systolic BP 413 244 010 272 536 - 644  Diastolic BP 74 73 80 88 79 - 73  Wt. (Lbs) - 199 186 193 - 184 -  BMI - 34.16 31.93 33.13 - 31.58 -   Foot/eye exam completion dates Latest Ref Rng & Units 08/24/2016 01/13/2016  Eye Exam No Retinopathy - No Retinopathy  Foot Form Completion - Done -

## 2017-11-27 NOTE — Assessment & Plan Note (Signed)
Uncontrolled ,ad amlodipine 5 mg  DASH diet and commitment to daily physical activity for a minimum of 30 minutes discussed and encouraged, as a part of hypertension management. The importance of attaining a healthy weight is also discussed.  BP/Weight 11/26/2017 11/18/2017 11/10/2017 11/09/2017 11/08/2017 11/07/2017 04/24/3341  Systolic BP 568 616 837 290 211 - 155  Diastolic BP 74 73 80 88 79 - 73  Wt. (Lbs) - 199 186 193 - 184 -  BMI - 34.16 31.93 33.13 - 31.58 -

## 2017-11-27 NOTE — Assessment & Plan Note (Signed)
Deteriorated. Patient re-educated about  the importance of commitment to a  minimum of 150 minutes of exercise per week.  The importance of healthy food choices with portion control discussed. Encouraged to start a food diary, count calories and to consider  joining a support group. Sample diet sheets offered. Goals set by the patient for the next several months.   Weight /BMI 11/18/2017 11/10/2017 11/09/2017  WEIGHT 199 lb 186 lb 193 lb  HEIGHT - 5\' 4"  5\' 4"   BMI 34.16 kg/m2 31.93 kg/m2 33.13 kg/m2

## 2017-11-27 NOTE — Assessment & Plan Note (Signed)
F/u from recent ED visit, improved, continue tramadol for use for pain

## 2017-11-29 ENCOUNTER — Telehealth: Payer: Self-pay | Admitting: Family Medicine

## 2017-11-29 ENCOUNTER — Other Ambulatory Visit: Payer: Self-pay

## 2017-11-29 ENCOUNTER — Ambulatory Visit (INDEPENDENT_AMBULATORY_CARE_PROVIDER_SITE_OTHER): Payer: Self-pay | Admitting: Otolaryngology

## 2017-11-29 ENCOUNTER — Encounter (HOSPITAL_COMMUNITY): Payer: Self-pay

## 2017-11-29 ENCOUNTER — Inpatient Hospital Stay (HOSPITAL_COMMUNITY): Payer: 59

## 2017-11-29 VITALS — BP 159/70 | HR 78 | Temp 98.0°F | Resp 18

## 2017-11-29 DIAGNOSIS — N184 Chronic kidney disease, stage 4 (severe): Secondary | ICD-10-CM

## 2017-11-29 DIAGNOSIS — D638 Anemia in other chronic diseases classified elsewhere: Secondary | ICD-10-CM

## 2017-11-29 DIAGNOSIS — I129 Hypertensive chronic kidney disease with stage 1 through stage 4 chronic kidney disease, or unspecified chronic kidney disease: Secondary | ICD-10-CM | POA: Diagnosis not present

## 2017-11-29 MED ORDER — ISOSORBIDE MONONITRATE ER 30 MG PO TB24: 30.0000 mg | ORAL_TABLET | Freq: Every day | ORAL | 5 refills | Status: DC

## 2017-11-29 MED ORDER — EZETIMIBE 10 MG PO TABS: 10.0000 mg | ORAL_TABLET | Freq: Every day | ORAL | 5 refills | Status: DC

## 2017-11-29 MED ORDER — SODIUM CHLORIDE 0.9 % IV SOLN
510.0000 mg | Freq: Once | INTRAVENOUS | Status: AC
  Administered 2017-11-29: 510 mg via INTRAVENOUS
  Filled 2017-11-29: qty 17

## 2017-11-29 MED ORDER — SODIUM CHLORIDE 0.9 % IV SOLN
INTRAVENOUS | Status: DC
  Administered 2017-11-29: 14:00:00 via INTRAVENOUS

## 2017-11-29 NOTE — Telephone Encounter (Signed)
Pharmacy called and said that Kathryn Beck is out of med.  This was on our voice mail and they did not leave the type of med she was out of.  Call back # is [phone]

## 2017-11-29 NOTE — Patient Instructions (Signed)
[facility] at [facility] Discharge Instructions  Received Feraheme infusion today. Follow-up as scheduled. Call clinic for any questions or concerns   Thank you for choosing  [facility] at [facility] to provide your oncology and hematology care.  To afford each patient quality time with our provider, please arrive at least 15 minutes before your scheduled appointment time.   If you have a lab appointment with the Cancer Center please come in thru the  Main Entrance and check in at the main information desk  You need to re-schedule your appointment should you arrive 10 or more minutes late.  We strive to give you quality time with our providers, and arriving late affects you and other patients whose appointments are after yours.  Also, if you no show three or more times for appointments you may be dismissed from the clinic at the providers discretion.     Again, thank you for choosing [facility].  Our hope is that these requests will decrease the amount of time that you wait before being seen by our physicians.       _____________________________________________________________  Should you have questions after your visit to [facility], please contact our office at [phone] between the hours of 8:00 a.m. and 4:30 p.m.  Voicemails left after 4:00 p.m. will not be returned until the following business day.  For prescription refill requests, have your pharmacy contact our office and allow 72 hours.    Cancer Center Support Programs:   > Cancer Support Group  2nd Tuesday of the month 1pm-2pm, Journey Room   

## 2017-11-29 NOTE — Telephone Encounter (Signed)
Nephrology to prescribe

## 2017-11-29 NOTE — Progress Notes (Signed)
Kathryn Beck tolerated Feraheme infusion well without complaints or incident. VSS upon discharge. Pt discharged self ambulatory using cane in satisfactory condition accompanied by her husband

## 2017-11-29 NOTE — Telephone Encounter (Signed)
Pharmacy called and said that pt is currently out of renvella 800mg  1 tab tid. She was getting it when she was in rehab but now she is out and the pharmacist wants to know if you will fill it or if it should be another dr she is seeing? Please advise

## 2017-11-30 ENCOUNTER — Ambulatory Visit (HOSPITAL_COMMUNITY): Payer: Self-pay

## 2017-11-30 ENCOUNTER — Encounter: Payer: Self-pay | Admitting: Internal Medicine

## 2017-11-30 ENCOUNTER — Telehealth: Payer: Self-pay | Admitting: Internal Medicine

## 2017-11-30 ENCOUNTER — Ambulatory Visit (INDEPENDENT_AMBULATORY_CARE_PROVIDER_SITE_OTHER): Payer: 59 | Admitting: Internal Medicine

## 2017-11-30 VITALS — BP 140/80 | HR 83 | Ht 64.0 in | Wt 199.0 lb

## 2017-11-30 DIAGNOSIS — D3500 Benign neoplasm of unspecified adrenal gland: Secondary | ICD-10-CM | POA: Diagnosis not present

## 2017-11-30 DIAGNOSIS — N184 Chronic kidney disease, stage 4 (severe): Secondary | ICD-10-CM

## 2017-11-30 DIAGNOSIS — E1122 Type 2 diabetes mellitus with diabetic chronic kidney disease: Secondary | ICD-10-CM | POA: Diagnosis not present

## 2017-11-30 DIAGNOSIS — E782 Mixed hyperlipidemia: Secondary | ICD-10-CM

## 2017-11-30 DIAGNOSIS — Z794 Long term (current) use of insulin: Secondary | ICD-10-CM | POA: Diagnosis not present

## 2017-11-30 LAB — POTASSIUM: Potassium: 4 mEq/L (ref 3.5–5.1)

## 2017-11-30 MED ORDER — INSULIN LISPRO 100 UNIT/ML (KWIKPEN): PEN_INJECTOR | SUBCUTANEOUS | 3 refills | Status: DC

## 2017-11-30 MED ORDER — DEXAMETHASONE 1 MG PO TABS: ORAL_TABLET | ORAL | 0 refills | Status: DC

## 2017-11-30 MED ORDER — INSULIN DETEMIR 100 UNIT/ML FLEXPEN: PEN_INJECTOR | SUBCUTANEOUS | 3 refills | Status: DC

## 2017-11-30 NOTE — Progress Notes (Signed)
Patient ID: Kathryn Beck, female   DOB: [birth year], 62 y.o.   MRN: [id number]  HPI: Kathryn Beck is a 62 y.o.-year-old female, initially referred by her PCP, Dr. Moshe Cipro, returning for follow-up for of DM2, dx in 2008, insulin-dependent since ~2015, uncontrolled, with complications (CKD stage 4, PN). She daw Dr. Dorris Fetch - last OV with him 05/2016.  Last visit with me 3.5 months ago.  Her kidney fxn is worse >> got fistula in L arm >> may need to start HD soon.  Plans for GBP Sx are on hold.  PCP also recommended to evaluate her for an adrenal mass.  05/28/2017: MRI abd: Bilateral adrenal nodules with signal dropout on out of phase imaging, consistent with adenomas.   Example at 2.5 cm on the left and on the order of 1.6 cm on the right.  10/10/2015: CT abdomen and pelvis without contrast: Stable 2.2 cm left adrenal nodule with Hounsfield unit measurements 28 unchanged likely a lipid poor adenoma  08/23/2008: MRI of the abdomen with and without contrast: Left adrenal mass measuring 1.6 x 1.5 cm (previously measuring 1.4 x 1.6 cm on the CT scan from 2003 -stable in size),  Of note, she has an enlarging pancreatic head mass - pt reports a h/o pancreatitis. (This will need to be eval. By GI)  DM2: Last hemoglobin A1c was: Lab Results  Component Value Date   HGBA1C 8.3 (H) 11/12/2017   HGBA1C 11.0 (H) 08/18/2017   HGBA1C 11.2 06/18/2017   Pt was on a regimen of: - Humalog 75/25 20-26 units before each meal, 2-3X a day depending on the CBG before the meal  - Levemir 100 units at bedtime  Then on: - Levemir 50 units x2 injections at bedtime  >> 50 units 2x a day (changed inhouse) - Humalog 3x a day 15 min before a meal: -32 units before a smaller meal -36 units before a larger meal  Pt checks her sugars 1-3 times a day per review of her meter download: - am: 165-387, 509, 540 (OJ) >> 232-493 >> 93-336 - 2h after b'fast: n/c >> 202, 296, 462 >> n/c - before lunch: 161-250, 370 >> 164,  181, 487 >> 31, 89-127 - 2h after lunch: n/c - before dinner: 200-300 >> 598 >> 280-436 >> 217, 300 - 2h after dinner: n/c - bedtime: 200-300s >> 267 >> 422, 448 >> 167-242, 362 - nighttime: n/c Lowest sugar was 164 >> 31 (???); it is unclear at which level she has hypoglycemia awareness. Highest sugar was 487 >> 362  Glucometer: Accuchek  Pt's meals are: - Breakfast: eggs, bacon, oatmeal, grits - Lunch: salads - Dinner: meat + veggie or sandwich + salad or nabs - Snacks: nabs, fruit cups, jello  -Stage IV CKD (Dr. Lowanda Foster now), last BUN/creatinine:  Lab Results  Component Value Date   BUN 51 (H) 11/26/2017   BUN 50 (H) 11/18/2017   CREATININE 3.10 (H) 11/26/2017   CREATININE 2.88 (H) 11/18/2017  On olmesartan 40. -+ HL; last set of lipids: Lab Results  Component Value Date   CHOL 198 11/12/2017   HDL 48 (L) 11/12/2017   LDLCALC 109 (H) 11/12/2017   LDLDIRECT 107 05/19/2016   TRIG 305 (H) 11/12/2017   CHOLHDL 4.1 11/12/2017  On Zetia 10, Lipitor 40. - last eye exam was on 08/2017: No DR reportedly. -No numbness but she has tingling in her feet.  She also has HTN.  ROS: Constitutional: + weight loss, no fatigue, no subjective  hyperthermia, no subjective hypothermia, + nocturia Eyes: no blurry vision, no xerophthalmia ENT: no sore throat, no nodules palpated in throat, no dysphagia, no odynophagia, + hoarseness Cardiovascular: no CP/no SOB/no palpitations/+ leg swelling Respiratory: no cough/no SOB/no wheezing Gastrointestinal: no N/no V/no D/no C/no acid reflux Musculoskeletal: + muscle aches/n+ joint aches Skin: no rashes, no hair loss Neurological: no tremors/no numbness/no tingling/no dizziness  I reviewed pt's medications, allergies, PMH, social hx, family hx, and changes were documented in the history of present illness. Otherwise, unchanged from my initial visit note.  Past Medical History:  Diagnosis Date  . Acid reflux   . Anemia   . Arthritis   .  Axillary masses    Soft tissue - status post excision  . Back pain   . CKD (chronic kidney disease) stage 5, GFR less than 15 ml/min (HCC)   . Diastolic heart failure ([place])   . Essential hypertension   . Ischemic heart disease    Abnormal Myoview April 2018 - medical therapy  . Mixed hyperlipidemia   . Obesity   . Pancreatitis   . Sleep apnea    Noncompliant with CPAP  . Type 2 diabetes mellitus, uncontrolled ([place])    Past Surgical History:  Procedure Laterality Date  . ABDOMINAL HYSTERECTOMY    . AV FISTULA PLACEMENT Left 09/02/2017   Procedure: creation of left arm ARTERIOVENOUS (AV) FISTULA;  Surgeon: Serafina Mitchell, MD;  Location: [facility] OR;  Service: Vascular;  Laterality: Left;  . COLONOSCOPY  2008   Dr. Oneida Alar: normal   . COLONOSCOPY N/A 12/18/2016   Dr. Oneida Alar: multiple tubular adenomas, internal hemorrhoids. Surveillance in 3 years   . ESOPHAGEAL DILATION N/A 10/13/2015   Procedure: ESOPHAGEAL DILATION;  Surgeon: Rogene Houston, MD;  Location: [facility];  Service: Endoscopy;  Laterality: N/A;  . ESOPHAGOGASTRODUODENOSCOPY N/A 10/13/2015   Dr. Laural Golden: chronic gastritis on path, no H.pylori. Empiric dilation   . ESOPHAGOGASTRODUODENOSCOPY N/A 12/18/2016   Dr. Oneida Alar: mild gastritis. BRAVO study revealed uncontrolled GERD. Dysphagia secondary to uncontrolled reflux  . FOOT SURGERY Bilateral   . LUNG BIOPSY    . MASS EXCISION Right 01/09/2013   Procedure: EXCISION OF NEOPLASM OF RIGHT  AXILLA  AND EXCISION OF NEOPLASM OF LEFT AXILLA;  Surgeon: Jamesetta So, MD;  Location: [facility] ORS;  Service: General;  Laterality: Right;  procedure end @ 08:23  . MYRINGOTOMY WITH TUBE PLACEMENT Bilateral 04/28/2017   Procedure: BILATERAL MYRINGOTOMY WITH TUBE PLACEMENT;  Surgeon: Leta Baptist, MD;  Location: [facility];  Service: ENT;  Laterality: Bilateral;  . SAVORY DILATION N/A 12/18/2016   Procedure: SAVORY DILATION;  Surgeon: Danie Binder, MD;  Location: [facility];  Service: Endoscopy;   Laterality: N/A;   Social History   Socioeconomic History  . Marital status: Married    Spouse name: Not on file  . Number of children: 2  Occupational History  . CNA  Tobacco Use  . Smoking status: Never Smoker  . Smokeless tobacco: Never Used  Substance and Sexual Activity  . Alcohol use: No  . Drug use: No   Current Outpatient Medications on File Prior to Visit  Medication Sig Dispense Refill  . acetaminophen (TYLENOL) 500 MG tablet Take by mouth.    Marland Kitchen acetaminophen-codeine (TYLENOL #3) 300-30 MG tablet Take 1 tablet by mouth every 6 (six) hours as needed for moderate pain. 30 tablet 0  . amLODipine (NORVASC) 5 MG tablet Take 1 tablet (5 mg total) by mouth daily. [place]  tablet 5  . azelastine (ASTELIN) 0.1 % nasal spray Place 2 sprays into both nostrils 2 (two) times daily. Use in each nostril as directed 30 mL 5  . calcitRIOL (ROCALTROL) 0.25 MCG capsule Take 0.25 mcg by mouth every Monday, Wednesday, and Friday.  36 capsule 0  . Calcium Carbonate Antacid (CALCIUM CARBONATE, DOSED IN MG ELEMENTAL CALCIUM,) 1250 MG/5ML SUSP Take 5 mLs (500 mg of elemental calcium total) by mouth every 6 (six) hours as needed for indigestion. 450 mL 1  . camphor-menthol (SARNA) lotion Apply 1 application topically every 8 (eight) hours as needed for itching. 222 mL 0  . cloNIDine (CATAPRES) 0.2 MG tablet One tablet at bedtime every night for blood pressure at 9 pm 30 tablet 3  . dexlansoprazole (DEXILANT) 60 MG capsule TAKE 1 CAPSULE IN THE MORNING WITH FOOD 30 capsule 3  . dextromethorphan-guaiFENesin (MUCINEX DM) 30-600 MG 12hr tablet Take 1 tablet by mouth 2 (two) times daily. 20 tablet 0  . ezetimibe (ZETIA) 10 MG tablet Take 1 tablet (10 mg total) by mouth daily. 30 tablet 5  . gabapentin (NEURONTIN) 100 MG capsule TAKE (1) CAPSULE BY MOUTH TWICE DAILY. 60 capsule 3  . HUMALOG KWIKPEN 100 UNIT/ML KiwkPen INJECT 8 UNITS WITH BREAKFAST LUNCH AND SUPPER IF SUGAR IS ABOVE 150. 6 mL 3  .  hydrochlorothiazide (MICROZIDE) 12.5 MG capsule Take 1 capsule (12.5 mg total) by mouth daily. 30 capsule 3  . hydrOXYzine (ATARAX/VISTARIL) 25 MG tablet Take 1 tablet (25 mg total) by mouth every 8 (eight) hours as needed for itching. 30 tablet 0  . insulin aspart (NOVOLOG) 100 UNIT/ML injection Inject 8 Units into the skin 3 (three) times daily with meals.    . insulin detemir (LEVEMIR) 100 UNIT/ML injection Inject 0.55 mLs (55 Units total) into the skin 2 (two) times daily. 10 mL 11  . isosorbide mononitrate (IMDUR) 30 MG 24 hr tablet Take 1 tablet (30 mg total) by mouth daily. 30 tablet 5  . LEVEMIR FLEXTOUCH 100 UNIT/ML Pen INJECT 55 UNITS INTO THE SKIN TWICE DAILY 30 mL 3  . ondansetron (ZOFRAN ODT) 4 MG disintegrating tablet Take 1 tablet (4 mg total) by mouth every 8 (eight) hours as needed for nausea or vomiting. 20 tablet 0  . predniSONE (DELTASONE) 50 MG tablet 1 tablet PO daily 4 tablet 0  . senna-docusate (SENOKOT-S) 8.6-50 MG tablet Take 2 tablets by mouth at bedtime as needed for mild constipation or moderate constipation. 120 tablet 1  . sertraline (ZOLOFT) 25 MG tablet TAKE 1 TABLET BY MOUTH ONCE DAILY. 30 tablet 3  . torsemide (DEMADEX) 20 MG tablet TAKE (2) TABLETS BY MOUTH ONCE DAILY AS NEEDED (EDEMA OR WEIGHT GAIN;3 POUNDS IN A DAY OR 5 POUNDS IN A WEEK). 30 tablet 5  . traMADol (ULTRAM) 50 MG tablet TAKE ONE TABLET BY MOUTH AT BEDTIME. MAY TAKE 1 TABLET UP TO EVERY 6 HOURS AS NEEDED FOR PAIN. (MAX OF 4 PER DAY) 30 tablet 0  . ULTICARE MINI PEN NEEDLES 31G X 6 MM MISC USE AS DIRECTED 100 each 0  . Vitamin D, Ergocalciferol, (DRISDOL) 50000 units CAPS capsule Take 1 capsule (50,000 Units total) by mouth every 7 (seven) days. 4 capsule 5  . [DISCONTINUED] FLUoxetine (PROZAC) 10 MG capsule Take 10 mg by mouth daily.      . [DISCONTINUED] glipiZIDE (GLUCOTROL) 10 MG tablet Take 10 mg by mouth 2 (two) times daily before a meal.       No  current facility-administered medications on  file prior to visit.    Allergies  Allergen Reactions  . Ace Inhibitors Anaphylaxis and Swelling  . Penicillins Itching, Swelling and Other (See Comments)    SWELLING REACTION UNSPECIFIED   PATIENT HAS HAD A PCN REACTION WITH IMMEDIATE RASH, FACIAL/TONGUE/THROAT SWELLING, SOB, OR LIGHTHEADEDNESS WITH HYPOTENSION:  #  #  YES  #  #  Has patient had a PCN reaction causing severe rash involving mucus membranes or skin necrosis: No Has patient had a PCN reaction that required hospitalization No Has patient had a PCN reaction occurring within the last 10 years: No If all of the above answers are "NO", then may proceed with Cephalosporin use.   . Statins Other (See Comments)    elevated LFT's  . Albuterol Swelling    SWELLING REACTION UNSPECIFIED    Family History  Problem Relation Age of Onset  . Hypertension Father   . Hypercholesterolemia Father   . Arthritis Father   . Hypertension Sister   . Hypercholesterolemia Sister   . Breast cancer Sister   . Hypertension Sister   . Colon cancer Neg Hx   . Colon polyps Neg Hx     PE: BP 140/80   Pulse 83   Ht 5\' 4"  (1.626 m)   Wt 199 lb (90.3 kg)   SpO2 96%   BMI 34.16 kg/m  Wt Readings from Last 3 Encounters:  11/30/17 199 lb (90.3 kg)  11/18/17 199 lb (90.3 kg)  11/10/17 186 lb (84.4 kg)   Constitutional: overweight, in NAD Eyes: PERRLA, EOMI, no exophthalmos ENT: moist mucous membranes, no thyromegaly, no cervical lymphadenopathy Cardiovascular: RRR, No MRG, + B pitting edema Respiratory: CTA B Gastrointestinal: abdomen soft, NT, ND, BS+ Musculoskeletal: no deformities, strength intact in all 4 Skin: moist, warm, no rashes Neurological: no tremor with outstretched hands, DTR normal in all 4  ASSESSMENT: 1. DM2, insulin-dependent, uncontrolled, with complications - CKD stage 4 - PN  2. HL  3. B adrenal adenomas  PLAN:  1. Patient with long-standing, uncontrolled, type 2 diabetes, previously on premixed insulin and  long-acting insulin, changed to basal-bolus insulin regimen.  At last visit, she was not using the recommended doses of Humalog only a sliding scale.  She also could not remember the specifics of the sliding scale.  She was also missing many insulin doses especially when she was eating out.  I strongly advised her to take the insulin pen with her and take it before every single meal.  We also increased her Humalog doses and switched from Levemir to [facility] for better site absorption since she is on high doses of basal insulin -Recent HbA1c obtained a month ago was much better, at 8.3%, decreased from 11.2% - I suggested to:  Patient Instructions  Please continue: - Toujeo 50 units x2 injections at bedtime - Humalog 3x a day 15 min before a meal: -32 units before a smaller meal -36 units before a larger meal If you do not eat, you may need to inject ~10-15 units Humalog every 4 hours, keeping an eye on your sugars.  Please return in 3 months with your sugar log.    - continue checking sugars at different times of the day - check 4x a day, rotating checks - advised for yearly eye exams >> she is UTD - Return to clinic in 3 mo with sugar log     2. HL - Reviewed latest lipid panel from 10/2017: LDL still above target, triglycerides  high. Lab Results  Component Value Date   CHOL 198 11/12/2017   HDL 48 (L) 11/12/2017   LDLCALC 109 (H) 11/12/2017   LDLDIRECT 107 05/19/2016   TRIG 305 (H) 11/12/2017   CHOLHDL 4.1 11/12/2017  - Continues Zetia and Lipitor without side effects. - At last visit, we discussed about the importance of a low-fat diet, also low in concentrated sweets.  3. B adrenal adenomas - new pb for me -Patient has bilateral adrenal adenomas per review of her recent abdominal MRI.  The imaging characteristics point towards benign tumors, of 2.5 and 1.6 cm respectively.  However, they have appeared to have increased in size over time.  Reviewing the MRI of the abdomen from 2010  and the CT of the abdomen and pelvis from 2017, only one nodule was seen then, on the left adrenal. - There is no clear sign of hormone production, but we discussed about checking her for:  Cushing syndrome, by checking a dexamethasone suppression test  Primary aldosteronoma, by checking an aldosterone, renin activity, and a potassium level  Pheochromocytoma, by taking plasma fractionated metanephrines and catecholamines - At today's visit, we will check the following: Orders Placed This Encounter  Procedures  . Metanephrines, plasma  . Catecholamines, fractionated, plasma  . Aldosterone + renin activity w/ ratio  . Potassium  - I sent a prescription for dexamethasone to her pharmacy and she will return at 8 AM for a cortisol level after taking dexamethasone at 11 PM the night before.  Ideally cortisol level is lower than 2.5, but tolerated up to 5.  - time spent with the patient: 40 minutes, of which >50% was spent in obtaining information about her symptoms, reviewing her previous labs, evaluations, and treatments, counseling her about her conditions (please see the discussed topics above), and developing a plan to further investigate and treat them; she had a number of questions which I addressed.  Component     Latest Ref Rng & Units 11/30/2017          Epinephrine     pg/mL see note  Norepinephrine     pg/mL 99  Dopamine     pg/mL see note  Catecholamines, Total     pg/mL 99  Metanephrine, Pl     <=57 pg/mL <25  Normetanephrine, Pl     <=148 pg/mL 71  Total Metanephrines-Plasma     <=205 pg/mL 71  ALDOSTERONE     ng/dL 3  Renin Activity     0.25 - 5.82 ng/mL/h 1.20  ALDO / PRA Ratio     0.9 - 28.9 Ratio 2.5  Potassium     3.5 - 5.1 mEq/L 4.0   Cortisol level pending.  The rest of the adrenal labs are normal.  [name], MD PhD [facility] Endocrinology

## 2017-11-30 NOTE — Patient Instructions (Addendum)
Please continue: - Levemir 50 units x2 a day  Please change: - Humalog 3x a day 15 min before a meal: -36 units before a smaller meal -40 units before a larger meal  Please stop at the lab.  Please take Dexamethasone at 11 pm the night before coming for labs at 8 am.  Please return in 3 months with your sugar log.

## 2017-11-30 NOTE — Telephone Encounter (Signed)
error 

## 2017-11-30 NOTE — Telephone Encounter (Signed)
insulin lispro (HUMALOG KWIKPEN) 100 UNIT/ML [place] is calling to clarify the dosage that was sent in for patient  Ph- [phone] Mitzi Hansen

## 2017-11-30 NOTE — Telephone Encounter (Signed)
Verified that the dose is correct

## 2017-12-01 NOTE — Telephone Encounter (Signed)
Message sent to pharmacy for neph to prescribe

## 2017-12-03 ENCOUNTER — Encounter (HOSPITAL_COMMUNITY): Payer: Self-pay

## 2017-12-03 ENCOUNTER — Inpatient Hospital Stay (HOSPITAL_COMMUNITY): Payer: 59

## 2017-12-03 ENCOUNTER — Other Ambulatory Visit: Payer: Self-pay

## 2017-12-03 VITALS — BP 119/52 | HR 92 | Temp 98.8°F | Resp 18

## 2017-12-03 DIAGNOSIS — I129 Hypertensive chronic kidney disease with stage 1 through stage 4 chronic kidney disease, or unspecified chronic kidney disease: Secondary | ICD-10-CM | POA: Diagnosis not present

## 2017-12-03 DIAGNOSIS — N184 Chronic kidney disease, stage 4 (severe): Secondary | ICD-10-CM

## 2017-12-03 DIAGNOSIS — D638 Anemia in other chronic diseases classified elsewhere: Secondary | ICD-10-CM

## 2017-12-03 DIAGNOSIS — D472 Monoclonal gammopathy: Secondary | ICD-10-CM

## 2017-12-03 LAB — CBC WITH DIFFERENTIAL/PLATELET
BASOS ABS: 0 10*3/uL (ref 0.0–0.1)
Basophils Relative: 0 %
EOS PCT: 0 %
Eosinophils Absolute: 0 10*3/uL (ref 0.0–0.7)
HEMATOCRIT: 31.1 % — AB (ref 36.0–46.0)
Hemoglobin: 9.2 g/dL — ABNORMAL LOW (ref 12.0–15.0)
LYMPHS ABS: 0.8 10*3/uL (ref 0.7–4.0)
Lymphocytes Relative: 8 %
MCH: 22.2 pg — ABNORMAL LOW (ref 26.0–34.0)
MCHC: 29.6 g/dL — AB (ref 30.0–36.0)
MCV: 75.1 fL — ABNORMAL LOW (ref 78.0–100.0)
Monocytes Absolute: 0.6 10*3/uL (ref 0.1–1.0)
Monocytes Relative: 6 %
NEUTROS ABS: 8.1 10*3/uL — AB (ref 1.7–7.7)
Neutrophils Relative %: 86 %
Platelets: 334 10*3/uL (ref 150–400)
RBC: 4.14 MIL/uL (ref 3.87–5.11)
RDW: 22.2 % — AB (ref 11.5–15.5)
WBC: 9.5 10*3/uL (ref 4.0–10.5)

## 2017-12-03 MED ORDER — EPOETIN ALFA 20000 UNIT/ML IJ SOLN
INTRAMUSCULAR | Status: AC
  Filled 2017-12-03: qty 1

## 2017-12-03 MED ORDER — EPOETIN ALFA 20000 UNIT/ML IJ SOLN
20000.0000 [IU] | Freq: Once | INTRAMUSCULAR | Status: AC
  Administered 2017-12-03: 20000 [IU] via SUBCUTANEOUS

## 2017-12-03 NOTE — Progress Notes (Signed)
Kathryn Beck presents today for injection per the provider's orders.  Procrit administration without incident; see MAR for injection details.  Patient tolerated procedure well and without incident.  No questions or complaints noted at this time.  Discharged via wheelchair in c/o family.

## 2017-12-08 ENCOUNTER — Other Ambulatory Visit (HOSPITAL_COMMUNITY): Payer: Self-pay

## 2017-12-08 DIAGNOSIS — D638 Anemia in other chronic diseases classified elsewhere: Secondary | ICD-10-CM

## 2017-12-08 DIAGNOSIS — D472 Monoclonal gammopathy: Secondary | ICD-10-CM

## 2017-12-08 DIAGNOSIS — N184 Chronic kidney disease, stage 4 (severe): Secondary | ICD-10-CM

## 2017-12-09 LAB — CATECHOLAMINES, FRACTIONATED, PLASMA
Catecholamines, Total: 99 pg/mL
Norepinephrine: 99 pg/mL

## 2017-12-09 LAB — ALDOSTERONE + RENIN ACTIVITY W/ RATIO
ALDO / PRA Ratio: 2.5 Ratio (ref 0.9–28.9)
Aldosterone: 3 ng/dL
Renin Activity: 1.2 ng/mL/h (ref 0.25–5.82)

## 2017-12-09 LAB — METANEPHRINES, PLASMA
NORMETANEPHRINE FREE: 71 pg/mL (ref <=148)
TOTAL METANEPHRINES-PLASMA: 71 pg/mL (ref <=205)

## 2017-12-10 ENCOUNTER — Inpatient Hospital Stay (HOSPITAL_COMMUNITY): Payer: 59

## 2017-12-10 ENCOUNTER — Encounter (HOSPITAL_COMMUNITY): Payer: Self-pay

## 2017-12-10 VITALS — BP 166/67 | HR 78 | Temp 97.8°F | Resp 18

## 2017-12-10 DIAGNOSIS — D638 Anemia in other chronic diseases classified elsewhere: Secondary | ICD-10-CM

## 2017-12-10 DIAGNOSIS — N184 Chronic kidney disease, stage 4 (severe): Secondary | ICD-10-CM

## 2017-12-10 DIAGNOSIS — D472 Monoclonal gammopathy: Secondary | ICD-10-CM

## 2017-12-10 DIAGNOSIS — I129 Hypertensive chronic kidney disease with stage 1 through stage 4 chronic kidney disease, or unspecified chronic kidney disease: Secondary | ICD-10-CM | POA: Diagnosis not present

## 2017-12-10 LAB — CBC WITH DIFFERENTIAL/PLATELET
Basophils Absolute: 0.1 10*3/uL (ref 0.0–0.1)
Basophils Relative: 1 %
EOS ABS: 0.1 10*3/uL (ref 0.0–0.7)
EOS PCT: 1 %
HEMATOCRIT: 33.7 % — AB (ref 36.0–46.0)
Hemoglobin: 9.9 g/dL — ABNORMAL LOW (ref 12.0–15.0)
LYMPHS ABS: 2.7 10*3/uL (ref 0.7–4.0)
LYMPHS PCT: 34 %
MCH: 22.2 pg — ABNORMAL LOW (ref 26.0–34.0)
MCHC: 29.4 g/dL — AB (ref 30.0–36.0)
MCV: 75.7 fL — AB (ref 78.0–100.0)
MONO ABS: 0.6 10*3/uL (ref 0.1–1.0)
Monocytes Relative: 7 %
Neutro Abs: 4.6 10*3/uL (ref 1.7–7.7)
Neutrophils Relative %: 57 %
PLATELETS: 401 10*3/uL — AB (ref 150–400)
RBC: 4.45 MIL/uL (ref 3.87–5.11)
RDW: 21.6 % — AB (ref 11.5–15.5)
WBC: 8 10*3/uL (ref 4.0–10.5)

## 2017-12-10 MED ORDER — EPOETIN ALFA 20000 UNIT/ML IJ SOLN
20000.0000 [IU] | Freq: Once | INTRAMUSCULAR | Status: AC
  Administered 2017-12-10: 20000 [IU] via SUBCUTANEOUS

## 2017-12-10 MED ORDER — EPOETIN ALFA 20000 UNIT/ML IJ SOLN
INTRAMUSCULAR | Status: AC
  Filled 2017-12-10: qty 1

## 2017-12-10 NOTE — Patient Instructions (Signed)
[facility] at [facility] Discharge Instructions  Received Procrit injection today. Follow-up as scheduled. Call clinic for any questions or concerns   Thank you for choosing [facility] at [facility] to provide your oncology and hematology care.  To afford each patient quality time with our provider, please arrive at least 15 minutes before your scheduled appointment time.   If you have a lab appointment with the Cancer Center please come in thru the  Main Entrance and check in at the main information desk  You need to re-schedule your appointment should you arrive 10 or more minutes late.  We strive to give you quality time with our providers, and arriving late affects you and other patients whose appointments are after yours.  Also, if you no show three or more times for appointments you may be dismissed from the clinic at the providers discretion.     Again, thank you for choosing [facility].  Our hope is that these requests will decrease the amount of time that you wait before being seen by our physicians.       _____________________________________________________________  Should you have questions after your visit to [facility], please contact our office at [phone] between the hours of 8:00 a.m. and 4:30 p.m.  Voicemails left after 4:00 p.m. will not be returned until the following business day.  For prescription refill requests, have your pharmacy contact our office and allow 72 hours.    Cancer Center Support Programs:   > Cancer Support Group  2nd Tuesday of the month 1pm-2pm, Journey Room   

## 2017-12-10 NOTE — Progress Notes (Signed)
Kathryn Beck tolerated Procrit injection well without complaints or incident. Hgb 9.9 today. VSS Pt discharged via wheelchair in satisfactory condition accompanied by a family member

## 2017-12-14 ENCOUNTER — Telehealth: Payer: Self-pay

## 2017-12-14 NOTE — Telephone Encounter (Signed)
Notified patient of message from Dr. Gherghe, patient expressed understanding and agreement. No further questions.  

## 2017-12-14 NOTE — Telephone Encounter (Signed)
-----   Message from [name], MD sent at 12/10/2017 11:42 AM EDT ----- [name], can you please call pt: Her adrenal labs are normal.  I am still waiting for the cortisol level to come back.

## 2017-12-15 ENCOUNTER — Ambulatory Visit (INDEPENDENT_AMBULATORY_CARE_PROVIDER_SITE_OTHER): Payer: 59 | Admitting: Cardiology

## 2017-12-15 ENCOUNTER — Encounter: Payer: Self-pay | Admitting: Cardiology

## 2017-12-15 VITALS — BP 152/78 | HR 68 | Ht 66.0 in | Wt 197.0 lb

## 2017-12-15 DIAGNOSIS — N184 Chronic kidney disease, stage 4 (severe): Secondary | ICD-10-CM | POA: Diagnosis not present

## 2017-12-15 DIAGNOSIS — E782 Mixed hyperlipidemia: Secondary | ICD-10-CM | POA: Diagnosis not present

## 2017-12-15 DIAGNOSIS — I10 Essential (primary) hypertension: Secondary | ICD-10-CM | POA: Diagnosis not present

## 2017-12-15 DIAGNOSIS — I259 Chronic ischemic heart disease, unspecified: Secondary | ICD-10-CM

## 2017-12-15 NOTE — Patient Instructions (Addendum)
Your physician wants you to follow-up in: 1 year with Dr.McDowell You will receive a reminder letter in the mail two months in advance. If you don't receive a letter, please call our office to schedule the follow-up appointment.    Take Aspirin 81 mg daily    If you need a refill on your cardiac medications before your next appointment, please call your pharmacy.    No lab work or testing ordered today.      Thank you for choosing [facility] !

## 2017-12-15 NOTE — Progress Notes (Signed)
Cardiology Office Note  Date: 12/15/2017   ID: Kathryn Beck, DOB [birth year], MRN [id number]  PCP: Fayrene Helper, MD  Primary Cardiologist: Rozann Lesches, MD   Chief Complaint  Patient presents with  . Cardiac follow-up    History of Present Illness: Kathryn Beck is a 62 y.o. female last seen in May 2018.  She presents for a routine follow-up visit.  She does not report any obvious angina symptoms, no progressive shortness of breath with baseline ADLs.  She is following with Dr. Delton Coombes for monoclonal gammopathy of uncertain significance.  She also has CKD stage IV and follows with Dr. Lowanda Foster.  She has undergone placement of a left brachiocephalic fistula as of June in anticipation of hemodialysis.  I reviewed interval lab work which is outlined below.  We went over her medications.  Current cardiac regimen includes aspirin, Norvasc, clonidine, Benicar, Zetia, Imdur, and Demadex.  I reviewed her interval ECG from July as described below.  We continue medical therapy and observation with previous abnormal Myoview from April of last year suggestive of underlying ischemic heart disease with anterior infarct scar and moderate peri-infarct ischemia.  Past Medical History:  Diagnosis Date  . Acid reflux   . Anemia   . Arthritis   . Axillary masses    Soft tissue - status post excision  . Back pain   . CKD (chronic kidney disease) stage 5, GFR less than 15 ml/min (HCC)   . Diastolic heart failure ([place])   . Essential hypertension   . Ischemic heart disease    Abnormal Myoview April 2018 - medical therapy  . Mixed hyperlipidemia   . Obesity   . Pancreatitis   . Sleep apnea    Noncompliant with CPAP  . Type 2 diabetes mellitus, uncontrolled ([place])     Past Surgical History:  Procedure Laterality Date  . ABDOMINAL HYSTERECTOMY    . AV FISTULA PLACEMENT Left 09/02/2017   Procedure: creation of left arm ARTERIOVENOUS (AV) FISTULA;  Surgeon: Serafina Mitchell, MD;   Location: [facility] OR;  Service: Vascular;  Laterality: Left;  . COLONOSCOPY  2008   Dr. Oneida Alar: normal   . COLONOSCOPY N/A 12/18/2016   Dr. Oneida Alar: multiple tubular adenomas, internal hemorrhoids. Surveillance in 3 years   . ESOPHAGEAL DILATION N/A 10/13/2015   Procedure: ESOPHAGEAL DILATION;  Surgeon: Rogene Houston, MD;  Location: [facility] END[facility] SUITE;  Service: Endoscopy;  Laterality: N/A;  . ESOPHAGOGASTRODUODENOSCOPY N/A 10/13/2015   Dr. Laural Golden: chronic gastritis on path, no H.pylori. Empiric dilation   . ESOPHAGOGASTRODUODENOSCOPY N/A 12/18/2016   Dr. Oneida Alar: mild gastritis. BRAVO study revealed uncontrolled GERD. Dysphagia secondary to uncontrolled reflux  . FOOT SURGERY Bilateral   . LUNG BIOPSY    . MASS EXCISION Right 01/09/2013   Procedure: EXCISION OF NEOPLASM OF RIGHT  AXILLA  AND EXCISION OF NEOPLASM OF LEFT AXILLA;  Surgeon: Jamesetta So, MD;  Location: [facility] ORS;  Service: General;  Laterality: Right;  procedure end @ 08:23  . MYRINGOTOMY WITH TUBE PLACEMENT Bilateral 04/28/2017   Procedure: BILATERAL MYRINGOTOMY WITH TUBE PLACEMENT;  Surgeon: Leta Baptist, MD;  Location: [facility];  Service: ENT;  Laterality: Bilateral;  . SAVORY DILATION N/A 12/18/2016   Procedure: SAVORY DILATION;  Surgeon: Danie Binder, MD;  Location: [facility];  Service: Endoscopy;  Laterality: N/A;    Current Outpatient Medications  Medication Sig Dispense Refill  . acetaminophen (TYLENOL) 500 MG tablet Take by mouth.    Marland Kitchen acetaminophen-codeine (  TYLENOL #3) 300-30 MG tablet Take 1 tablet by mouth every 6 (six) hours as needed for moderate pain. 30 tablet 0  . amLODipine (NORVASC) 5 MG tablet Take 1 tablet (5 mg total) by mouth daily. 30 tablet 5  . aspirin EC 81 MG tablet Take 81 mg by mouth daily.    Marland Kitchen azelastine (ASTELIN) 0.1 % nasal spray Place 2 sprays into both nostrils 2 (two) times daily. Use in each nostril as directed 30 mL 5  . calcitRIOL (ROCALTROL) 0.25 MCG capsule Take 0.25 mcg by mouth every Monday,  Wednesday, and Friday.  36 capsule 0  . Calcium Carbonate Antacid (CALCIUM CARBONATE, DOSED IN MG ELEMENTAL CALCIUM,) 1250 MG/5ML SUSP Take 5 mLs (500 mg of elemental calcium total) by mouth every 6 (six) hours as needed for indigestion. 450 mL 1  . camphor-menthol (SARNA) lotion Apply 1 application topically every 8 (eight) hours as needed for itching. 222 mL 0  . cloNIDine (CATAPRES) 0.2 MG tablet One tablet at bedtime every night for blood pressure at 9 pm 30 tablet 3  . dexamethasone (DECADRON) 1 MG tablet Take 1 tablet by mouth once at 11 pm, before coming for labs at 8 am the next morning 1 tablet 0  . dexlansoprazole (DEXILANT) 60 MG capsule TAKE 1 CAPSULE IN THE MORNING WITH FOOD 30 capsule 3  . dextromethorphan-guaiFENesin (MUCINEX DM) 30-600 MG 12hr tablet Take 1 tablet by mouth 2 (two) times daily. 20 tablet 0  . ezetimibe (ZETIA) 10 MG tablet Take 1 tablet (10 mg total) by mouth daily. 30 tablet 5  . gabapentin (NEURONTIN) 100 MG capsule TAKE (1) CAPSULE BY MOUTH TWICE DAILY. 60 capsule 3  . hydrochlorothiazide (MICROZIDE) 12.5 MG capsule Take 1 capsule (12.5 mg total) by mouth daily. 30 capsule 3  . hydrOXYzine (ATARAX/VISTARIL) 25 MG tablet Take 1 tablet (25 mg total) by mouth every 8 (eight) hours as needed for itching. 30 tablet 0  . insulin aspart (NOVOLOG) 100 UNIT/ML injection Inject 8 Units into the skin 3 (three) times daily with meals.    . Insulin Detemir (LEVEMIR FLEXTOUCH) 100 UNIT/ML Pen INJECT 50 UNITS INTO THE SKIN TWICE DAILY 90 mL 3  . insulin lispro (HUMALOG KWIKPEN) 100 UNIT/ML KiwkPen INJECT 36-40 units 3x a day before meals 90 mL 3  . isosorbide mononitrate (IMDUR) 30 MG 24 hr tablet Take 1 tablet (30 mg total) by mouth daily. 30 tablet 5  . olmesartan (BENICAR) 40 MG tablet Take 40 mg by mouth daily.    . ondansetron (ZOFRAN ODT) 4 MG disintegrating tablet Take 1 tablet (4 mg total) by mouth every 8 (eight) hours as needed for nausea or vomiting. 20 tablet 0  .  predniSONE (DELTASONE) 50 MG tablet 1 tablet PO daily 4 tablet 0  . senna-docusate (SENOKOT-S) 8.6-50 MG tablet Take 2 tablets by mouth at bedtime as needed for mild constipation or moderate constipation. 120 tablet 1  . sertraline (ZOLOFT) 25 MG tablet TAKE 1 TABLET BY MOUTH ONCE DAILY. 30 tablet 3  . sevelamer carbonate (RENVELA) 800 MG tablet   6  . torsemide (DEMADEX) 20 MG tablet TAKE (2) TABLETS BY MOUTH ONCE DAILY AS NEEDED (EDEMA OR WEIGHT GAIN;3 POUNDS IN A DAY OR 5 POUNDS IN A WEEK). 30 tablet 5  . traMADol (ULTRAM) 50 MG tablet TAKE ONE TABLET BY MOUTH AT BEDTIME. MAY TAKE 1 TABLET UP TO EVERY 6 HOURS AS NEEDED FOR PAIN. (MAX OF 4 PER DAY) 30 tablet 0  .  ULTICARE MINI PEN NEEDLES 31G X 6 MM MISC USE AS DIRECTED 100 each 0  . Vitamin D, Ergocalciferol, (DRISDOL) 50000 units CAPS capsule Take 1 capsule (50,000 Units total) by mouth every 7 (seven) days. 4 capsule 5   No current facility-administered medications for this visit.    Allergies:  Ace inhibitors; Penicillins; Statins; and Albuterol   Social History: The patient  reports that she has never smoked. She has never used smokeless tobacco. She reports that she does not drink alcohol or use drugs.   ROS:  Please see the history of present illness. Otherwise, complete review of systems is positive for fatigue.  All other systems are reviewed and negative.   Physical Exam: VS:  BP (!) 152/78 (BP Location: Right Arm)   Pulse 68   Ht 5\' 6"  (1.676 m)   Wt 197 lb (89.4 kg)   SpO2 95%   BMI 31.80 kg/m , BMI Body mass index is 31.8 kg/m.  Wt Readings from Last 3 Encounters:  12/15/17 197 lb (89.4 kg)  11/30/17 199 lb (90.3 kg)  11/18/17 199 lb (90.3 kg)    General: Patient appears comfortable at rest.  Using a walker. HEENT: Conjunctiva and lids normal, oropharynx clear. Neck: Supple, no elevated JVP or carotid bruits, no thyromegaly. Lungs: Clear to auscultation, nonlabored breathing at rest. Cardiac: Regular rate and  rhythm, no S3, 2/6 systolic murmur. Abdomen: Soft, nontender, bowel sounds present. Extremities: Mild lower leg edema, distal pulses 2+.  AV fistula left forearm. Skin: Warm and dry. Musculoskeletal: No kyphosis. Neuropsychiatric: Alert and oriented x3, affect grossly appropriate.  ECG: I personally reviewed the tracing from 09/19/2017 which showed sinus rhythm with nonspecific T wave changes.  Recent Labwork: 12/24/2016: TSH 4.16 08/31/2017: B Natriuretic Peptide 84.5 09/27/2017: Magnesium 1.9 11/26/2017: ALT 34; AST 18; BUN 51; Creatinine, Ser 3.10; Sodium 142 11/30/2017: Potassium 4.0 12/10/2017: Hemoglobin 9.9; Platelets 401     Component Value Date/Time   CHOL 198 11/12/2017 0936   TRIG 305 (H) 11/12/2017 0936   HDL 48 (L) 11/12/2017 0936   CHOLHDL 4.1 11/12/2017 0936   VLDL NOT CALC 05/19/2016 1344   LDLCALC 109 (H) 11/12/2017 0936   LDLDIRECT 107 05/19/2016 1344    Other Studies Reviewed Today:  Echocardiogram 09/01/2017: Study Conclusions  - Left ventricle: The cavity size was normal. Systolic function was   normal. The estimated ejection fraction was in the range of 55%   to 60%. Wall motion was normal; there were no regional wall   motion abnormalities. Doppler parameters are consistent with   abnormal left ventricular relaxation (grade 1 diastolic   dysfunction). - Left atrium: The atrium was mildly dilated.  Chest x-ray 09/19/2017: FINDINGS: Cardiac shadow is stable. The lungs are well aerated bilaterally. No focal infiltrate or sizable effusion is seen. No acute bony abnormality is noted.  IMPRESSION: No active disease.  Assessment and Plan:  1.  Ischemic heart disease based on noninvasive myocardial perfusion imaging from last year.  She does not report any active angina symptoms on medical therapy.  We have not pursued cardiac catheterization in light of no progressive symptoms and with concurrent CKD stage IV.  Continue with current medical therapy.  2.   CKD stage IV, following with Dr. Lowanda Foster.  She now has left forearm AV fistula in anticipation of hemodialysis ultimately.  3.  Essential hypertension, continue with multimodal therapy.  Keep follow-up with Dr. Moshe Cipro.  4.  Mixed hyperlipidemia, currently on Zetia with follow-up per Dr. Moshe Cipro.  Recent LDL 109.  Current medicines were reviewed with the patient today.  Disposition: Follow-up in 1 year, sooner if needed.  Signed, Satira Sark, MD, [facility] 12/15/2017 10:25 AM    [place] at [facility] [address], [place], [place] [address] Phone: [phone]; Fax: [phone]

## 2017-12-17 ENCOUNTER — Other Ambulatory Visit (HOSPITAL_COMMUNITY): Payer: Self-pay

## 2017-12-17 DIAGNOSIS — N184 Chronic kidney disease, stage 4 (severe): Secondary | ICD-10-CM

## 2017-12-17 DIAGNOSIS — D638 Anemia in other chronic diseases classified elsewhere: Secondary | ICD-10-CM

## 2017-12-17 DIAGNOSIS — D472 Monoclonal gammopathy: Secondary | ICD-10-CM

## 2017-12-20 ENCOUNTER — Inpatient Hospital Stay (HOSPITAL_COMMUNITY): Payer: 59

## 2017-12-20 ENCOUNTER — Other Ambulatory Visit: Payer: Self-pay

## 2017-12-20 ENCOUNTER — Encounter (HOSPITAL_COMMUNITY): Payer: Self-pay

## 2017-12-20 ENCOUNTER — Other Ambulatory Visit: Payer: Self-pay | Admitting: Family Medicine

## 2017-12-20 ENCOUNTER — Ambulatory Visit (INDEPENDENT_AMBULATORY_CARE_PROVIDER_SITE_OTHER): Payer: 59 | Admitting: Family Medicine

## 2017-12-20 ENCOUNTER — Encounter: Payer: Self-pay | Admitting: Family Medicine

## 2017-12-20 VITALS — BP 180/80 | HR 93 | Resp 12 | Ht 63.0 in | Wt 195.0 lb

## 2017-12-20 VITALS — BP 159/76 | HR 76 | Temp 98.4°F | Resp 18

## 2017-12-20 DIAGNOSIS — M25562 Pain in left knee: Secondary | ICD-10-CM

## 2017-12-20 DIAGNOSIS — E1122 Type 2 diabetes mellitus with diabetic chronic kidney disease: Secondary | ICD-10-CM

## 2017-12-20 DIAGNOSIS — I10 Essential (primary) hypertension: Secondary | ICD-10-CM | POA: Diagnosis not present

## 2017-12-20 DIAGNOSIS — D638 Anemia in other chronic diseases classified elsewhere: Secondary | ICD-10-CM

## 2017-12-20 DIAGNOSIS — Z1231 Encounter for screening mammogram for malignant neoplasm of breast: Secondary | ICD-10-CM

## 2017-12-20 DIAGNOSIS — D472 Monoclonal gammopathy: Secondary | ICD-10-CM

## 2017-12-20 DIAGNOSIS — N184 Chronic kidney disease, stage 4 (severe): Secondary | ICD-10-CM

## 2017-12-20 DIAGNOSIS — Z794 Long term (current) use of insulin: Secondary | ICD-10-CM

## 2017-12-20 DIAGNOSIS — I129 Hypertensive chronic kidney disease with stage 1 through stage 4 chronic kidney disease, or unspecified chronic kidney disease: Secondary | ICD-10-CM | POA: Diagnosis not present

## 2017-12-20 LAB — CBC WITH DIFFERENTIAL/PLATELET
Basophils Absolute: 0 10*3/uL (ref 0.0–0.1)
Basophils Relative: 0 %
EOS ABS: 0.1 10*3/uL (ref 0.0–0.7)
Eosinophils Relative: 1 %
HCT: 35.4 % — ABNORMAL LOW (ref 36.0–46.0)
HEMOGLOBIN: 10.5 g/dL — AB (ref 12.0–15.0)
Lymphocytes Relative: 36 %
Lymphs Abs: 2.5 10*3/uL (ref 0.7–4.0)
MCH: 22.7 pg — ABNORMAL LOW (ref 26.0–34.0)
MCHC: 29.7 g/dL — AB (ref 30.0–36.0)
MCV: 76.6 fL — ABNORMAL LOW (ref 78.0–100.0)
MONOS PCT: 11 %
Monocytes Absolute: 0.8 10*3/uL (ref 0.1–1.0)
NEUTROS PCT: 52 %
Neutro Abs: 3.7 10*3/uL (ref 1.7–7.7)
Platelets: 439 10*3/uL — ABNORMAL HIGH (ref 150–400)
RBC: 4.62 MIL/uL (ref 3.87–5.11)
RDW: 20.9 % — ABNORMAL HIGH (ref 11.5–15.5)
WBC: 7.1 10*3/uL (ref 4.0–10.5)

## 2017-12-20 MED ORDER — EPOETIN ALFA 20000 UNIT/ML IJ SOLN
20000.0000 [IU] | Freq: Once | INTRAMUSCULAR | Status: AC
  Administered 2017-12-20: 20000 [IU] via SUBCUTANEOUS
  Filled 2017-12-20: qty 1

## 2017-12-20 MED ORDER — AMLODIPINE BESYLATE 10 MG PO TABS: 10.0000 mg | ORAL_TABLET | Freq: Every day | ORAL | 3 refills | Status: DC

## 2017-12-20 NOTE — Assessment & Plan Note (Signed)
Unchanged Patient re-educated about  the importance of commitment to a  minimum of 150 minutes of exercise per week.  The importance of healthy food choices with portion control discussed. Encouraged to start a food diary, count calories and to consider  joining a support group. Sample diet sheets offered. Goals set by the patient for the next several months.   Weight /BMI 12/20/2017 12/15/2017 11/30/2017  WEIGHT 195 lb 0.6 oz 197 lb 199 lb  HEIGHT 5\' 3"  5\' 6"  5\' 4"   BMI 34.55 kg/m2 31.8 kg/m2 34.16 kg/m2

## 2017-12-20 NOTE — Assessment & Plan Note (Signed)
Uncontrolled, increase amlodipine to 10 mg  DASH diet and commitment to daily physical activity for a minimum of 30 minutes discussed and encouraged, as a part of hypertension management. The importance of attaining a healthy weight is also discussed.  BP/Weight 12/20/2017 12/20/2017 12/15/2017 12/10/2017 12/03/2017 7/95/3692 04/26/95  Systolic BP 949 971 820 990 689 340 684  Diastolic BP 80 76 78 67 52 80 70  Wt. (Lbs) 195.04 - 197 - - 199 -  BMI 34.55 - 31.8 - - 34.16 -     F/u in 2 to 4 weeks

## 2017-12-20 NOTE — Progress Notes (Signed)
Kathryn Beck     MRN: [id number]      DOB: 11/12/1955   HPI Kathryn Beck is here for follow up ov uncontrolled blood pressure and re-evaluation of chronic medical conditions, medication management and review of any available recent lab and radiology data.  Preventive health is updated, specifically  Diabetic foot exam is completed and she does qualify for shoes as she has neuropathy and flat feet .   Heme/ Oncology has asked that she come in for blood pressure management as her blood pressure is elevated at several visits, and she also confirms that her home readings are 150 and above Denies polyuria, polydipsia, blurred vision , or hypoglycemic episodes. Blood sugar has improved, but still eating excess carbs with spikes ROS Denies recent fever or chills. Denies sinus pressure, nasal congestion, ear pain or sore throat. Denies chest congestion, productive cough or wheezing. Denies chest pains, palpitations and leg swelling Denies abdominal pain, nausea, vomiting,diarrhea or constipation.   Denies dysuria, frequency, hesitancy or incontinence. C/o left knee pain and instability, nor recent trauma, no falls, now using a cane for safety, needs ortho eval. Denies headaches, seizures, numbness, or tingling. Denies uncontrolled depression, anxiety or insomnia. Denies skin break down or rash.   PE  BP (!) 180/80   Pulse 93   Resp 12   Ht 5\' 3"  (1.6 m)   Wt 195 lb 0.6 oz (88.5 kg)   SpO2 97% Comment: room air  BMI 34.55 kg/m   Patient alert and oriented and in no cardiopulmonary distress.  HEENT: No facial asymmetry, EOMI,   oropharynx pink and moist.  Neck supple no JVD, no mass.  Chest: Clear to auscultation bilaterally.  CVS: S1, S2 no murmurs, no S3.Regular rate.  ABD: Soft non tender.   Ext: No edema  MS: Adequate ROM spine, shoulders, hips and reduced in left knee with tenderness on lateral aspect.and crepitus, mild swelling noted  Skin: Intact, no ulcerations or  rash noted.  Psych: Good eye contact, normal affect. Memory intact not anxious or depressed appearing.  CNS: CN 2-12 intact, power,  normal throughout.no focal deficits noted.   Assessment & Plan  Malignant hypertension Uncontrolled, increase amlodipine to 10 mg  DASH diet and commitment to daily physical activity for a minimum of 30 minutes discussed and encouraged, as a part of hypertension management. The importance of attaining a healthy weight is also discussed.  BP/Weight 12/20/2017 12/20/2017 12/15/2017 12/10/2017 12/03/2017 5/46/2703 5/0/0938  Systolic BP 182 993 716 967 893 810 175  Diastolic BP 80 76 78 67 52 80 70  Wt. (Lbs) 195.04 - 197 - - 199 -  BMI 34.55 - 31.8 - - 34.16 -     F/u in 2 to 4 weeks  Anemia of chronic disease Being treated with Procrit with improvement  Type 2 diabetes mellitus with stage 4 chronic kidney disease, with long-term current use of insulin (HCC) Followed by endo and has improved, though still not at goal Kathryn Beck is reminded of the importance of commitment to daily physical activity for 30 minutes or more, as able and the need to limit carbohydrate intake to 30 to 60 grams per meal to help with blood sugar control.   The need to take medication as prescribed, test blood sugar as directed, and to call between visits if there is a concern that blood sugar is uncontrolled is also discussed.   Kathryn Beck is reminded of the importance of daily foot exam, annual eye examination,  and good blood sugar, blood pressure and cholesterol control.  Diabetic Labs Latest Ref Rng & Units 11/26/2017 11/18/2017 11/12/2017 11/07/2017 10/15/2017  HbA1c <5.7 % of total Hgb - - 8.3(H) - -  Microalbumin Not estab mg/dL - - - - -  Micro/Creat Ratio <30 mcg/mg creat - - - - -  Chol <200 mg/dL - - 198 - -  HDL >50 mg/dL - - 48(L) - -  Calc LDL mg/dL (calc) - - 109(H) - -  Triglycerides <150 mg/dL - - 305(H) - -  Creatinine 0.44 - 1.00 mg/dL 3.10(H) 2.88(H) - 3.32(H)  2.98(H)   BP/Weight 12/20/2017 12/20/2017 12/15/2017 12/10/2017 12/03/2017 12/06/3844 08/25/9933  Systolic BP 701 779 390 300 923 300 762  Diastolic BP 80 76 78 67 52 80 70  Wt. (Lbs) 195.04 - 197 - - 199 -  BMI 34.55 - 31.8 - - 34.16 -   Foot/eye exam completion dates Latest Ref Rng & Units 12/20/2017 08/24/2016  Eye Exam No Retinopathy - -  Foot Form Completion - Done Done        Morbid obesity (HCC) Unchanged Patient re-educated about  the importance of commitment to a  minimum of 150 minutes of exercise per week.  The importance of healthy food choices with portion control discussed. Encouraged to start a food diary, count calories and to consider  joining a support group. Sample diet sheets offered. Goals set by the patient for the next several months.   Weight /BMI 12/20/2017 12/15/2017 11/30/2017  WEIGHT 195 lb 0.6 oz 197 lb 199 lb  HEIGHT 5\' 3"  5\' 6"  5\' 4"   BMI 34.55 kg/m2 31.8 kg/m2 34.16 kg/m2      Acute pain of left knee 3 week h/o unprovoked left knee pain and instability, using cane for safety. Tender on lateral aspect, refer ortho for management asap

## 2017-12-20 NOTE — Assessment & Plan Note (Signed)
3 week h/o unprovoked left knee pain and instability, using cane for safety. Tender on lateral aspect, refer ortho for management asap

## 2017-12-20 NOTE — Patient Instructions (Addendum)
F/Un in 3 weeks , call if you need me before  Blood pressure is high, new dose of amlodipine is 10 mg one daily, start when next due, no later than tomorrow  You are referred to Dr Aline Brochure re left knee pain and instability x  3 weeks his office will call you  You  DO qualify for diabetic shoes, you can check [facility], PLS examine feet every day  Congrats on improved blood pressure  Thank you  for choosing [facility]. We consider it a privelige to serve you.  Delivering excellent health care in a caring and  compassionate way is our goal.  Partnering with you,  so that together we can achieve this goal is our strategy.

## 2017-12-20 NOTE — Assessment & Plan Note (Signed)
Followed by endo and has improved, though still not at goal Kathryn Beck is reminded of the importance of commitment to daily physical activity for 30 minutes or more, as able and the need to limit carbohydrate intake to 30 to 60 grams per meal to help with blood sugar control.   The need to take medication as prescribed, test blood sugar as directed, and to call between visits if there is a concern that blood sugar is uncontrolled is also discussed.   Kathryn Beck is reminded of the importance of daily foot exam, annual eye examination, and good blood sugar, blood pressure and cholesterol control.  Diabetic Labs Latest Ref Rng & Units 11/26/2017 11/18/2017 11/12/2017 11/07/2017 10/15/2017  HbA1c <5.7 % of total Hgb - - 8.3(H) - -  Microalbumin Not estab mg/dL - - - - -  Micro/Creat Ratio <30 mcg/mg creat - - - - -  Chol <200 mg/dL - - 198 - -  HDL >50 mg/dL - - 48(L) - -  Calc LDL mg/dL (calc) - - 109(H) - -  Triglycerides <150 mg/dL - - 305(H) - -  Creatinine 0.44 - 1.00 mg/dL 3.10(H) 2.88(H) - 3.32(H) 2.98(H)   BP/Weight 12/20/2017 12/20/2017 12/15/2017 12/10/2017 12/03/2017 6/96/2952 10/25/1322  Systolic BP 401 027 253 664 403 474 259  Diastolic BP 80 76 78 67 52 80 70  Wt. (Lbs) 195.04 - 197 - - 199 -  BMI 34.55 - 31.8 - - 34.16 -   Foot/eye exam completion dates Latest Ref Rng & Units 12/20/2017 08/24/2016  Eye Exam No Retinopathy - -  Foot Form Completion - Done Done

## 2017-12-20 NOTE — Assessment & Plan Note (Signed)
Being treated with Procrit with improvement

## 2017-12-20 NOTE — Progress Notes (Signed)
Gilberte L Rieves tolerated Procrit injection well without complaints or incident. Hgb 10.5 today. VSS Pt discharged via wheelchair in satisfactory condition accompanied by family member

## 2017-12-20 NOTE — Patient Instructions (Signed)
[place] at [facility] Discharge Instructions  Received Procrit injection. Follow-up as scheduled. Call clinic for any questions or concerns   Thank you for choosing [place] at [facility] to provide your oncology and hematology care.  To afford each patient quality time with our provider, please arrive at least 15 minutes before your scheduled appointment time.   If you have a lab appointment with the [facility] please come in thru the  Main Entrance and check in at the main information desk  You need to re-schedule your appointment should you arrive 10 or more minutes late.  We strive to give you quality time with our providers, and arriving late affects you and other patients whose appointments are after yours.  Also, if you no show three or more times for appointments you may be dismissed from the clinic at the providers discretion.     Again, thank you for choosing [facility].  Our hope is that these requests will decrease the amount of time that you wait before being seen by our physicians.       _____________________________________________________________  Should you have questions after your visit to [facility], please contact our office at [phone] between the hours of 8:00 a.m. and 4:30 p.m.  Voicemails left after 4:00 p.m. will not be returned until the following business day.  For prescription refill requests, have your pharmacy contact our office and allow 72 hours.    Cancer Center Support Programs:   > Cancer Support Group  2nd Tuesday of the month 1pm-2pm, Journey Room

## 2017-12-24 ENCOUNTER — Other Ambulatory Visit (HOSPITAL_COMMUNITY): Payer: Self-pay

## 2017-12-24 DIAGNOSIS — N184 Chronic kidney disease, stage 4 (severe): Secondary | ICD-10-CM

## 2017-12-24 DIAGNOSIS — D638 Anemia in other chronic diseases classified elsewhere: Secondary | ICD-10-CM

## 2017-12-26 ENCOUNTER — Other Ambulatory Visit (HOSPITAL_COMMUNITY): Payer: Self-pay | Admitting: Hematology

## 2017-12-27 ENCOUNTER — Inpatient Hospital Stay (HOSPITAL_COMMUNITY): Payer: 59 | Attending: Hematology

## 2017-12-27 ENCOUNTER — Other Ambulatory Visit: Payer: Self-pay | Admitting: Family Medicine

## 2017-12-27 ENCOUNTER — Inpatient Hospital Stay (HOSPITAL_COMMUNITY): Payer: 59

## 2017-12-27 DIAGNOSIS — Z79899 Other long term (current) drug therapy: Secondary | ICD-10-CM | POA: Insufficient documentation

## 2017-12-27 DIAGNOSIS — N185 Chronic kidney disease, stage 5: Secondary | ICD-10-CM | POA: Insufficient documentation

## 2017-12-27 DIAGNOSIS — I120 Hypertensive chronic kidney disease with stage 5 chronic kidney disease or end stage renal disease: Secondary | ICD-10-CM | POA: Insufficient documentation

## 2017-12-27 DIAGNOSIS — D631 Anemia in chronic kidney disease: Secondary | ICD-10-CM | POA: Insufficient documentation

## 2017-12-27 DIAGNOSIS — D638 Anemia in other chronic diseases classified elsewhere: Secondary | ICD-10-CM

## 2017-12-27 DIAGNOSIS — N184 Chronic kidney disease, stage 4 (severe): Secondary | ICD-10-CM

## 2017-12-27 LAB — CBC WITH DIFFERENTIAL/PLATELET
BASOS ABS: 0 10*3/uL (ref 0.0–0.1)
Basophils Relative: 1 %
EOS PCT: 2 %
Eosinophils Absolute: 0.1 10*3/uL (ref 0.0–0.7)
HCT: 40.1 % (ref 36.0–46.0)
Hemoglobin: 11.8 g/dL — ABNORMAL LOW (ref 12.0–15.0)
LYMPHS PCT: 37 %
Lymphs Abs: 2.7 10*3/uL (ref 0.7–4.0)
MCH: 22.7 pg — ABNORMAL LOW (ref 26.0–34.0)
MCHC: 29.4 g/dL — ABNORMAL LOW (ref 30.0–36.0)
MCV: 77.3 fL — AB (ref 78.0–100.0)
Monocytes Absolute: 0.6 10*3/uL (ref 0.1–1.0)
Monocytes Relative: 8 %
Neutro Abs: 3.9 10*3/uL (ref 1.7–7.7)
Neutrophils Relative %: 54 %
PLATELETS: 431 10*3/uL — AB (ref 150–400)
RBC: 5.19 MIL/uL — AB (ref 3.87–5.11)
RDW: 20.9 % — ABNORMAL HIGH (ref 11.5–15.5)
WBC: 7.3 10*3/uL (ref 4.0–10.5)

## 2017-12-27 NOTE — Progress Notes (Signed)
Hgb 11.8 today. Procrit injection held per parameters. Reviewed with pt who verbalized understanding

## 2017-12-30 ENCOUNTER — Encounter: Payer: Self-pay | Admitting: Internal Medicine

## 2017-12-30 ENCOUNTER — Other Ambulatory Visit (HOSPITAL_COMMUNITY): Payer: Self-pay

## 2017-12-30 DIAGNOSIS — N184 Chronic kidney disease, stage 4 (severe): Secondary | ICD-10-CM

## 2017-12-30 DIAGNOSIS — D638 Anemia in other chronic diseases classified elsewhere: Secondary | ICD-10-CM

## 2017-12-30 DIAGNOSIS — D472 Monoclonal gammopathy: Secondary | ICD-10-CM

## 2018-01-03 ENCOUNTER — Encounter (HOSPITAL_COMMUNITY): Payer: Self-pay

## 2018-01-03 ENCOUNTER — Inpatient Hospital Stay (HOSPITAL_COMMUNITY): Payer: 59

## 2018-01-03 ENCOUNTER — Telehealth: Payer: Self-pay | Admitting: Cardiology

## 2018-01-03 ENCOUNTER — Other Ambulatory Visit: Payer: Self-pay

## 2018-01-03 VITALS — BP 160/70 | HR 95 | Temp 98.0°F | Resp 18

## 2018-01-03 DIAGNOSIS — N184 Chronic kidney disease, stage 4 (severe): Secondary | ICD-10-CM

## 2018-01-03 DIAGNOSIS — D638 Anemia in other chronic diseases classified elsewhere: Secondary | ICD-10-CM

## 2018-01-03 DIAGNOSIS — D472 Monoclonal gammopathy: Secondary | ICD-10-CM

## 2018-01-03 DIAGNOSIS — I120 Hypertensive chronic kidney disease with stage 5 chronic kidney disease or end stage renal disease: Secondary | ICD-10-CM | POA: Diagnosis not present

## 2018-01-03 LAB — CBC
HEMATOCRIT: 36.9 % (ref 36.0–46.0)
HEMOGLOBIN: 10.6 g/dL — AB (ref 12.0–15.0)
MCH: 22 pg — ABNORMAL LOW (ref 26.0–34.0)
MCHC: 28.7 g/dL — ABNORMAL LOW (ref 30.0–36.0)
MCV: 76.6 fL — ABNORMAL LOW (ref 80.0–100.0)
Platelets: 358 10*3/uL (ref 150–400)
RBC: 4.82 MIL/uL (ref 3.87–5.11)
RDW: 20.5 % — ABNORMAL HIGH (ref 11.5–15.5)
WBC: 6.9 10*3/uL (ref 4.0–10.5)
nRBC: 0 % (ref 0.0–0.2)

## 2018-01-03 MED ORDER — ASPIRIN EC 81 MG PO TBEC: 81.0000 mg | DELAYED_RELEASE_TABLET | Freq: Every day | ORAL | 1 refills | Status: DC

## 2018-01-03 MED ORDER — EPOETIN ALFA 20000 UNIT/ML IJ SOLN
20000.0000 [IU] | Freq: Once | INTRAMUSCULAR | Status: AC
  Administered 2018-01-03: 20000 [IU] via SUBCUTANEOUS
  Filled 2018-01-03: qty 1

## 2018-01-03 NOTE — Telephone Encounter (Signed)
PLEASE SEND RX FOR aspirin EC 81 MG tablet [[id number]]  TO [facility] SO THEY CAN PUT IT IN HER PACKS

## 2018-01-03 NOTE — Telephone Encounter (Signed)
Medication sent to pharmacy  

## 2018-01-03 NOTE — Progress Notes (Signed)
TOMORROW DEHAAS presents today for injection per the provider's orders.  Procrit administration without incident; see MAR for injection details.  Patient tolerated procedure well and without incident.  No questions or complaints noted at this time.  Discharged via wheelchair in c/o spouse.

## 2018-01-10 ENCOUNTER — Ambulatory Visit (INDEPENDENT_AMBULATORY_CARE_PROVIDER_SITE_OTHER): Payer: 59 | Admitting: Family Medicine

## 2018-01-10 ENCOUNTER — Inpatient Hospital Stay (HOSPITAL_COMMUNITY): Payer: 59

## 2018-01-10 ENCOUNTER — Telehealth: Payer: Self-pay | Admitting: Family Medicine

## 2018-01-10 ENCOUNTER — Encounter: Payer: Self-pay | Admitting: Family Medicine

## 2018-01-10 VITALS — BP 188/80 | HR 100 | Resp 12 | Ht 64.0 in | Wt 204.0 lb

## 2018-01-10 DIAGNOSIS — D472 Monoclonal gammopathy: Secondary | ICD-10-CM

## 2018-01-10 DIAGNOSIS — I10 Essential (primary) hypertension: Secondary | ICD-10-CM | POA: Diagnosis not present

## 2018-01-10 DIAGNOSIS — D638 Anemia in other chronic diseases classified elsewhere: Secondary | ICD-10-CM

## 2018-01-10 DIAGNOSIS — N184 Chronic kidney disease, stage 4 (severe): Secondary | ICD-10-CM

## 2018-01-10 DIAGNOSIS — I120 Hypertensive chronic kidney disease with stage 5 chronic kidney disease or end stage renal disease: Secondary | ICD-10-CM | POA: Diagnosis not present

## 2018-01-10 DIAGNOSIS — E782 Mixed hyperlipidemia: Secondary | ICD-10-CM | POA: Diagnosis not present

## 2018-01-10 LAB — CBC
HCT: 39.4 % (ref 36.0–46.0)
HEMOGLOBIN: 11.2 g/dL — AB (ref 12.0–15.0)
MCH: 21.7 pg — ABNORMAL LOW (ref 26.0–34.0)
MCHC: 28.4 g/dL — AB (ref 30.0–36.0)
MCV: 76.4 fL — ABNORMAL LOW (ref 80.0–100.0)
NRBC: 0.4 % — AB (ref 0.0–0.2)
Platelets: 439 10*3/uL — ABNORMAL HIGH (ref 150–400)
RBC: 5.16 MIL/uL — ABNORMAL HIGH (ref 3.87–5.11)
RDW: 20.8 % — ABNORMAL HIGH (ref 11.5–15.5)
WBC: 7.5 10*3/uL (ref 4.0–10.5)

## 2018-01-10 MED ORDER — CLONIDINE HCL 0.3 MG PO TABS: ORAL_TABLET | ORAL | 3 refills | Status: DC

## 2018-01-10 NOTE — Assessment & Plan Note (Signed)
Hyperlipidemia:Low fat diet discussed and encouraged.   Lipid Panel  Lab Results  Component Value Date   CHOL 198 11/12/2017   HDL 48 (L) 11/12/2017   LDLCALC 109 (H) 11/12/2017   LDLDIRECT 107 05/19/2016   TRIG 305 (H) 11/12/2017   CHOLHDL 4.1 11/12/2017   Not at goal , needs to lower fried and fatty foods

## 2018-01-10 NOTE — Patient Instructions (Addendum)
F/u in 8 weeks, blood pressure still high, goal is 140 or less  Please call back and confirm if you are taking amlodipine 10 mg and clonidine 0.2 mg one of each at night  I will need to increase the dose of the clonidine if you are since bP averages over 160  Great job checking and documenting, please bring ALL medication and your blood pressure cuff to your visits

## 2018-01-10 NOTE — Assessment & Plan Note (Signed)
Uncontrolled, increase dose to 0.3 twice daily, continue  Amlodipine as before DASH diet and commitment to daily physical activity for a minimum of 30 minutes discussed and encouraged, as a part of hypertension management. The importance of attaining a healthy weight is also discussed.  BP/Weight 01/10/2018 01/03/2018 12/20/2017 12/20/2017 12/15/2017 12/10/2017 06/06/7423  Systolic BP 525 894 834 758 307 460 029  Diastolic BP 80 70 80 76 78 67 52  Wt. (Lbs) 204 - 195.04 - 197 - -  BMI 35.02 - 34.55 - 31.8 - -

## 2018-01-10 NOTE — Telephone Encounter (Signed)
I tried to call mobile # busy, documented # ion msg states it was changed She needs to increase the clonidine to 0.3 mg two times daly 12 hours apart, the dose is 0.3 mg not 0.2 , so tablet strength is increased and is sent in, pls call and let her know

## 2018-01-10 NOTE — Progress Notes (Signed)
Hgb 11.2 today. Epogen injection held per parameters. Informed pt who verbalized understanding

## 2018-01-10 NOTE — Assessment & Plan Note (Signed)
Deteriorated. Patient re-educated about  the importance of commitment to a  minimum of 150 minutes of exercise per week.  The importance of healthy food choices with portion control discussed. Encouraged to start a food diary, count calories and to consider  joining a support group. Sample diet sheets offered. Goals set by the patient for the next several months.   Weight /BMI 01/10/2018 12/20/2017 12/15/2017  WEIGHT 204 lb 195 lb 0.6 oz 197 lb  HEIGHT 5\' 4"  5\' 3"  5\' 6"   BMI 35.02 kg/m2 34.55 kg/m2 31.8 kg/m2

## 2018-01-10 NOTE — Telephone Encounter (Signed)
Clonidine 0.2 mg--taken at night  Amlodipine stopped taken that in the nursing home.

## 2018-01-10 NOTE — Progress Notes (Signed)
Kathryn Beck     MRN: [id number]      DOB: 1955-12-30   HPI [name] is here for follow up and re-evaluation of chronic medical conditions,in particular uncontroled hypertension. She has been testing  Daily and   SBP averages around 160 to 170.  Preventive health is updated, specifically  Cancer screening and Immunization.   Questions or concerns regarding consultations or procedures which the PT has had in the interim are  addressed. The PT denies any adverse reactions to current medications since the last visit.   Denies polyuria, polydipsia, blurred vision , or hypoglycemic episodes.  ROS Denies recent fever or chills. Denies sinus pressure, nasal congestion, ear pain or sore throat. Denies chest congestion, productive cough or wheezing. Denies chest pains, palpitations and c/o mild  leg swelling Denies abdominal pain, nausea, vomiting,diarrhea or constipation.   Denies dysuria, frequency, hesitancy or incontinence. Denies joint pain, swelling and limitation in mobility. Denies headaches, seizures, numbness, or tingling. Denies depression, anxiety or insomnia. Denies skin break down or rash.   PE  BP (!) 188/80   Pulse 100   Resp 12   Ht 5\' 4"  (1.626 m)   Wt 204 lb (92.5 kg)   SpO2 98% Comment: room air  BMI 35.02 kg/m   Patient alert and oriented and in no cardiopulmonary distress.  HEENT: No facial asymmetry, EOMI,   oropharynx pink and moist.  Neck supple no JVD, no mass.  Chest: Clear to auscultation bilaterally.  CVS: S1, S2 no murmurs, no S3.Regular rate.  ABD: Soft non tender.   Ext: No edema  MS: Adequate though reduced  ROM spine, shoulders, hips and knees.  Skin: Intact, no ulcerations or rash noted.  Psych: Good eye contact, normal affect. Memory intact not anxious or depressed appearing.  CNS: CN 2-12 intact, power,  normal throughout.no focal deficits noted.   Assessment & Plan  Malignant hypertension Uncontrolled, increase dose to  0.3 twice daily, continue  Amlodipine as before DASH diet and commitment to daily physical activity for a minimum of 30 minutes discussed and encouraged, as a part of hypertension management. The importance of attaining a healthy weight is also discussed.  BP/Weight 01/10/2018 01/03/2018 12/20/2017 12/20/2017 12/15/2017 12/10/2017 8/58/8502  Systolic BP 774 128 786 767 209 470 962  Diastolic BP 80 70 80 76 78 67 52  Wt. (Lbs) 204 - 195.04 - 197 - -  BMI 35.02 - 34.55 - 31.8 - -       Morbid obesity (HCC) Deteriorated. Patient re-educated about  the importance of commitment to a  minimum of 150 minutes of exercise per week.  The importance of healthy food choices with portion control discussed. Encouraged to start a food diary, count calories and to consider  joining a support group. Sample diet sheets offered. Goals set by the patient for the next several months.   Weight /BMI 01/10/2018 12/20/2017 12/15/2017  WEIGHT 204 lb 195 lb 0.6 oz 197 lb  HEIGHT 5\' 4"  5\' 3"  5\' 6"   BMI 35.02 kg/m2 34.55 kg/m2 31.8 kg/m2      Mixed hyperlipidemia Hyperlipidemia:Low fat diet discussed and encouraged.   Lipid Panel  Lab Results  Component Value Date   CHOL 198 11/12/2017   HDL 48 (L) 11/12/2017   LDLCALC 109 (H) 11/12/2017   LDLDIRECT 107 05/19/2016   TRIG 305 (H) 11/12/2017   CHOLHDL 4.1 11/12/2017   Not at goal , needs to lower fried and fatty foods

## 2018-01-11 NOTE — Telephone Encounter (Signed)
Left message on patients mobile number requesting call back to discuss medication changes.

## 2018-01-11 NOTE — Telephone Encounter (Signed)
Patient's daughter aware of medication change

## 2018-01-17 ENCOUNTER — Encounter (HOSPITAL_COMMUNITY): Payer: Self-pay

## 2018-01-17 ENCOUNTER — Inpatient Hospital Stay (HOSPITAL_COMMUNITY): Payer: 59

## 2018-01-17 ENCOUNTER — Encounter: Payer: Self-pay | Admitting: Orthopedic Surgery

## 2018-01-17 ENCOUNTER — Other Ambulatory Visit: Payer: Self-pay

## 2018-01-17 ENCOUNTER — Ambulatory Visit (INDEPENDENT_AMBULATORY_CARE_PROVIDER_SITE_OTHER): Payer: 59

## 2018-01-17 ENCOUNTER — Ambulatory Visit (INDEPENDENT_AMBULATORY_CARE_PROVIDER_SITE_OTHER): Payer: 59 | Admitting: Orthopedic Surgery

## 2018-01-17 ENCOUNTER — Other Ambulatory Visit: Payer: Self-pay | Admitting: Orthopedic Surgery

## 2018-01-17 ENCOUNTER — Telehealth: Payer: Self-pay | Admitting: Radiology

## 2018-01-17 VITALS — BP 118/56 | HR 56 | Temp 97.8°F | Resp 18

## 2018-01-17 DIAGNOSIS — I120 Hypertensive chronic kidney disease with stage 5 chronic kidney disease or end stage renal disease: Secondary | ICD-10-CM | POA: Diagnosis not present

## 2018-01-17 DIAGNOSIS — M25562 Pain in left knee: Secondary | ICD-10-CM

## 2018-01-17 DIAGNOSIS — D472 Monoclonal gammopathy: Secondary | ICD-10-CM

## 2018-01-17 DIAGNOSIS — D638 Anemia in other chronic diseases classified elsewhere: Secondary | ICD-10-CM

## 2018-01-17 DIAGNOSIS — M79605 Pain in left leg: Secondary | ICD-10-CM

## 2018-01-17 DIAGNOSIS — M545 Low back pain, unspecified: Secondary | ICD-10-CM

## 2018-01-17 DIAGNOSIS — N184 Chronic kidney disease, stage 4 (severe): Secondary | ICD-10-CM

## 2018-01-17 DIAGNOSIS — M1710 Unilateral primary osteoarthritis, unspecified knee: Secondary | ICD-10-CM

## 2018-01-17 LAB — IRON AND TIBC
Iron: 61 ug/dL (ref 28–170)
Saturation Ratios: 22 % (ref 10.4–31.8)
TIBC: 276 ug/dL (ref 250–450)
UIBC: 215 ug/dL

## 2018-01-17 LAB — RETICULOCYTES
Immature Retic Fract: 15.3 % (ref 2.3–15.9)
RBC.: 4.79 MIL/uL (ref 3.87–5.11)
Retic Count, Absolute: 119.3 10*3/uL (ref 19.0–186.0)
Retic Ct Pct: 2.5 % (ref 0.4–3.1)

## 2018-01-17 LAB — CBC WITH DIFFERENTIAL/PLATELET
Abs Immature Granulocytes: 0.04 10*3/uL (ref 0.00–0.07)
BASOS ABS: 0 10*3/uL (ref 0.0–0.1)
Basophils Relative: 1 %
EOS PCT: 1 %
Eosinophils Absolute: 0.1 10*3/uL (ref 0.0–0.5)
HCT: 36.1 % (ref 36.0–46.0)
Hemoglobin: 10.6 g/dL — ABNORMAL LOW (ref 12.0–15.0)
IMMATURE GRANULOCYTES: 1 %
LYMPHS PCT: 40 %
Lymphs Abs: 2.6 10*3/uL (ref 0.7–4.0)
MCH: 22.1 pg — AB (ref 26.0–34.0)
MCHC: 29.4 g/dL — AB (ref 30.0–36.0)
MCV: 75.4 fL — AB (ref 80.0–100.0)
Monocytes Absolute: 0.5 10*3/uL (ref 0.1–1.0)
Monocytes Relative: 7 %
NEUTROS PCT: 50 %
NRBC: 0 % (ref 0.0–0.2)
Neutro Abs: 3.3 10*3/uL (ref 1.7–7.7)
Platelets: 360 10*3/uL (ref 150–400)
RBC: 4.79 MIL/uL (ref 3.87–5.11)
RDW: 20.5 % — AB (ref 11.5–15.5)
WBC: 6.5 10*3/uL (ref 4.0–10.5)

## 2018-01-17 LAB — COMPREHENSIVE METABOLIC PANEL
ALBUMIN: 3 g/dL — AB (ref 3.5–5.0)
ALT: 26 U/L (ref 0–44)
ANION GAP: 12 (ref 5–15)
AST: 20 U/L (ref 15–41)
Alkaline Phosphatase: 86 U/L (ref 38–126)
BILIRUBIN TOTAL: 0.5 mg/dL (ref 0.3–1.2)
BUN: 77 mg/dL — ABNORMAL HIGH (ref 8–23)
CALCIUM: 9 mg/dL (ref 8.9–10.3)
CO2: 20 mmol/L — ABNORMAL LOW (ref 22–32)
Chloride: 109 mmol/L (ref 98–111)
Creatinine, Ser: 4.64 mg/dL — ABNORMAL HIGH (ref 0.44–1.00)
GFR, EST AFRICAN AMERICAN: 11 mL/min — AB (ref >60)
GFR, EST NON AFRICAN AMERICAN: 9 mL/min — AB (ref >60)
Glucose, Bld: 104 mg/dL — ABNORMAL HIGH (ref 70–99)
POTASSIUM: 4.4 mmol/L (ref 3.5–5.1)
Sodium: 141 mmol/L (ref 135–145)
TOTAL PROTEIN: 6.7 g/dL (ref 6.5–8.1)

## 2018-01-17 LAB — LACTATE DEHYDROGENASE: LDH: 278 U/L — ABNORMAL HIGH (ref 98–192)

## 2018-01-17 LAB — FERRITIN: FERRITIN: 522 ng/mL — AB (ref 11–307)

## 2018-01-17 LAB — VITAMIN B12: Vitamin B-12: 426 pg/mL (ref 180–914)

## 2018-01-17 MED ORDER — ALPRAZOLAM 0.25 MG PO TABS: 0.2500 mg | ORAL_TABLET | Freq: Every evening | ORAL | 0 refills | Status: DC | PRN

## 2018-01-17 MED ORDER — EPOETIN ALFA 20000 UNIT/ML IJ SOLN
20000.0000 [IU] | Freq: Once | INTRAMUSCULAR | Status: AC
  Administered 2018-01-17: 20000 [IU] via SUBCUTANEOUS

## 2018-01-17 MED ORDER — EPOETIN ALFA 20000 UNIT/ML IJ SOLN
INTRAMUSCULAR | Status: AC
  Filled 2018-01-17: qty 1

## 2018-01-17 NOTE — Progress Notes (Signed)
Kathryn Beck presents today for injection per the provider's orders.  Procrit administration without incident; see MAR for injection details.  Patient tolerated procedure well and without incident.  No questions or complaints noted at this time.  Discharged via wheelchair in c/o spouse.

## 2018-01-17 NOTE — Telephone Encounter (Signed)
Called to schedule MRI for Oct 31st at 2pm arrive at 1:30 called patient to advise.   Unable to reach on cell called home number. No answer there either.

## 2018-01-17 NOTE — Progress Notes (Signed)
NEW problem OFFICE VISIT  Chief Complaint  Patient presents with  . Knee Pain    left    HPI My entire left side hurts  Patient presents with a greater than 1 month history of pain on the left lower extremity starting in her left lower back left hip left leg left knee all the way down to her left foot.  She says the pain is severe.  It is constant.  It is worse with any activity it is better with rest it is associated with a tingling down the left leg and pain in throbbing in the left knee Review of Systems  Constitutional: Positive for malaise/fatigue. Negative for chills, fever and weight loss.  Gastrointestinal: Negative.   Genitourinary: Positive for flank pain.  Musculoskeletal: Positive for back pain and joint pain.  Neurological: Positive for tingling.     Past Medical History:  Diagnosis Date  . Acid reflux   . Anemia   . Arthritis   . Axillary masses    Soft tissue - status post excision  . Back pain   . CKD (chronic kidney disease) stage 5, GFR less than 15 ml/min (HCC)   . Diastolic heart failure ([place])   . Essential hypertension   . Ischemic heart disease    Abnormal Myoview April 2018 - medical therapy  . Mixed hyperlipidemia   . Obesity   . Pancreatitis   . Sleep apnea    Noncompliant with CPAP  . Type 2 diabetes mellitus, uncontrolled ([place])     Past Surgical History:  Procedure Laterality Date  . ABDOMINAL HYSTERECTOMY    . AV FISTULA PLACEMENT Left 09/02/2017   Procedure: creation of left arm ARTERIOVENOUS (AV) FISTULA;  Surgeon: Serafina Mitchell, MD;  [facility] OR;  Service: Vascular;  Laterality: Left;  . COLONOSCOPY  2008   Dr. Oneida Alar: normal   . COLONOSCOPY N/A 12/18/2016   Dr. Oneida Alar: multiple tubular adenomas, internal hemorrhoids. Surveillance in 3 years   . ESOPHAGEAL DILATION N/A 10/13/2015   Procedure: ESOPHAGEAL DILATION;  Surgeon: Rogene Houston, MD;  Location: [facility];  Service: Endoscopy;  Laterality: N/A;  .  ESOPHAGOGASTRODUODENOSCOPY N/A 10/13/2015   Dr. Laural Golden: chronic gastritis on path, no H.pylori. Empiric dilation   . ESOPHAGOGASTRODUODENOSCOPY N/A 12/18/2016   Dr. Oneida Alar: mild gastritis. BRAVO study revealed uncontrolled GERD. Dysphagia secondary to uncontrolled reflux  . FOOT SURGERY Bilateral   . LUNG BIOPSY    . MASS EXCISION Right 01/09/2013   Procedure: EXCISION OF NEOPLASM OF RIGHT  AXILLA  AND EXCISION OF NEOPLASM OF LEFT AXILLA;  Surgeon: Jamesetta So, MD;  Location: [facility] ORS;  Service: General;  Laterality: Right;  procedure end @ 08:23  . MYRINGOTOMY WITH TUBE PLACEMENT Bilateral 04/28/2017   Procedure: BILATERAL MYRINGOTOMY WITH TUBE PLACEMENT;  Surgeon: Leta Baptist, MD;  Location: [place];  Service: ENT;  Laterality: Bilateral;  . SAVORY DILATION N/A 12/18/2016   Procedure: SAVORY DILATION;  Surgeon: Danie Binder, MD;  Location: [facility];  Service: Endoscopy;  Laterality: N/A;    Family History  Problem Relation Age of Onset  . Hypertension Father   . Hypercholesterolemia Father   . Arthritis Father   . Hypertension Sister   . Hypercholesterolemia Sister   . Breast cancer Sister   . Hypertension Sister   . Colon cancer Neg Hx   . Colon polyps Neg Hx    Social History   Tobacco Use  . Smoking status: Never Smoker  .  Smokeless tobacco: Never Used  Substance Use Topics  . Alcohol use: No  . Drug use: No    Allergies  Allergen Reactions  . Ace Inhibitors Anaphylaxis and Swelling  . Penicillins Itching, Swelling and Other (See Comments)    SWELLING REACTION UNSPECIFIED   PATIENT HAS HAD A PCN REACTION WITH IMMEDIATE RASH, FACIAL/TONGUE/THROAT SWELLING, SOB, OR LIGHTHEADEDNESS WITH HYPOTENSION:  #  #  YES  #  #  Has patient had a PCN reaction causing severe rash involving mucus membranes or skin necrosis: No Has patient had a PCN reaction that required hospitalization No Has patient had a PCN reaction occurring within the last 10 years: No If all of the above  answers are "NO", then may proceed with Cephalosporin use.   . Statins Other (See Comments)    elevated LFT's  . Albuterol Swelling    SWELLING REACTION UNSPECIFIED     Current Meds  Medication Sig  . acetaminophen (TYLENOL) 500 MG tablet Take by mouth.  Marland Kitchen aspirin EC 81 MG tablet Take 1 tablet (81 mg total) by mouth daily.  Marland Kitchen azelastine (ASTELIN) 0.1 % nasal spray Place 2 sprays into both nostrils 2 (two) times daily. Use in each nostril as directed  . Calcifediol ER 30 MCG CPCR Take by mouth.  . calcitRIOL (ROCALTROL) 0.25 MCG capsule Take 0.25 mcg by mouth every Monday, Wednesday, and Friday.   . camphor-menthol (SARNA) lotion Apply 1 application topically every 8 (eight) hours as needed for itching.  . cloNIDine (CATAPRES) 0.3 MG tablet Take one tablet two times daily  . dexamethasone (DECADRON) 1 MG tablet Take 1 tablet by mouth once at 11 pm, before coming for labs at 8 am the next morning  . dexlansoprazole (DEXILANT) 60 MG capsule TAKE 1 CAPSULE IN THE MORNING WITH FOOD  . ezetimibe (ZETIA) 10 MG tablet Take 1 tablet (10 mg total) by mouth daily.  Marland Kitchen gabapentin (NEURONTIN) 100 MG capsule TAKE (1) CAPSULE BY MOUTH TWICE DAILY.  . hydrochlorothiazide (MICROZIDE) 12.5 MG capsule TAKE (1) CAPSULE BY MOUTH ONCE DAILY.  Marland Kitchen Insulin Detemir (LEVEMIR FLEXTOUCH) 100 UNIT/ML Pen INJECT 50 UNITS INTO THE SKIN TWICE DAILY  . insulin lispro (HUMALOG KWIKPEN) 100 UNIT/ML KiwkPen INJECT 36-40 units 3x a day before meals  . isosorbide mononitrate (IMDUR) 30 MG 24 hr tablet Take 1 tablet (30 mg total) by mouth daily.  Marland Kitchen olmesartan (BENICAR) 40 MG tablet Take 40 mg by mouth daily.  Marland Kitchen senna-docusate (SENOKOT-S) 8.6-50 MG tablet Take 2 tablets by mouth at bedtime as needed for mild constipation or moderate constipation.  . sertraline (ZOLOFT) 25 MG tablet TAKE 1 TABLET BY MOUTH ONCE DAILY.  . sevelamer carbonate (RENVELA) 800 MG tablet   . torsemide (DEMADEX) 20 MG tablet TAKE (2) TABLETS BY MOUTH ONCE  DAILY AS NEEDED (EDEMA OR WEIGHT GAIN;3 POUNDS IN A DAY OR 5 POUNDS IN A WEEK).  Marland Kitchen ULTICARE MINI PEN NEEDLES 31G X 6 MM MISC USE AS DIRECTED  . Vitamin D, Ergocalciferol, (DRISDOL) 50000 units CAPS capsule TAKE 1 CAPSULE BY MOUTH ONCE A WEEK    There were no vitals taken for this visit.  Physical Exam  Ortho Exam  Gait is supported by a cane  She is tender in her lower back  Right lower extremity shows no malalignment full range of motion normal stability strength and skin pulse perfusion normal sensation intact no pathologic reflexes  Left knee is tender over the medial joint line she has normal range of motion  stability and strength skin is normal alignment is normal pulse and perfusion normal sensation is normal  Straight leg raise on the right normal straight leg raise on the left abnormal  MEDICAL DECISION SECTION  Xrays were done at [place] office please see dictated reports The knee x-ray shows mild osteoarthritis left knee  Her back film was negative her hip film degenerative change by report  Encounter Diagnoses  Name Primary?  . Low back pain radiating to left leg Yes  . Primary localized osteoarthritis of knee     PLAN: (Rx., injectx, surgery, frx, mri/ct) Inject left knee  MRI lumbar spine  Meds ordered this encounter  Medications  . ALPRAZolam (XANAX) 0.25 MG tablet    Sig: Take 1 tablet (0.25 mg total) by mouth at bedtime as needed for anxiety.    Dispense:  1 tablet    Refill:  0    Arther Abbott, MD  01/17/2018 6:10 PM

## 2018-01-18 LAB — KAPPA/LAMBDA LIGHT CHAINS
KAPPA, LAMDA LIGHT CHAIN RATIO: 2.33 — AB (ref 0.26–1.65)
Kappa free light chain: 175.5 mg/L — ABNORMAL HIGH (ref 3.3–19.4)
Lambda free light chains: 75.4 mg/L — ABNORMAL HIGH (ref 5.7–26.3)

## 2018-01-18 LAB — PROTEIN ELECTROPHORESIS, SERUM
A/G RATIO SPE: 0.9 (ref 0.7–1.7)
ALBUMIN ELP: 2.7 g/dL — AB (ref 2.9–4.4)
ALPHA-2-GLOBULIN: 0.9 g/dL (ref 0.4–1.0)
Alpha-1-Globulin: 0.1 g/dL (ref 0.0–0.4)
BETA GLOBULIN: 1.3 g/dL (ref 0.7–1.3)
GAMMA GLOBULIN: 0.7 g/dL (ref 0.4–1.8)
Globulin, Total: 3.1 g/dL (ref 2.2–3.9)
M-Spike, %: 0.3 g/dL — ABNORMAL HIGH
Total Protein ELP: 5.8 g/dL — ABNORMAL LOW (ref 6.0–8.5)

## 2018-01-18 NOTE — Telephone Encounter (Signed)
Called again, voice mail box not set up yet.

## 2018-01-20 ENCOUNTER — Other Ambulatory Visit (HOSPITAL_COMMUNITY): Payer: Self-pay

## 2018-01-20 ENCOUNTER — Ambulatory Visit (HOSPITAL_COMMUNITY): Payer: 59 | Attending: Orthopedic Surgery

## 2018-01-24 ENCOUNTER — Encounter (HOSPITAL_COMMUNITY): Payer: Self-pay | Admitting: Hematology

## 2018-01-24 ENCOUNTER — Other Ambulatory Visit: Payer: Self-pay

## 2018-01-24 ENCOUNTER — Inpatient Hospital Stay (HOSPITAL_COMMUNITY): Payer: 59 | Attending: Hematology | Admitting: Hematology

## 2018-01-24 ENCOUNTER — Inpatient Hospital Stay (HOSPITAL_COMMUNITY): Payer: 59

## 2018-01-24 VITALS — BP 168/71 | HR 68 | Temp 98.1°F | Resp 18 | Wt 209.5 lb

## 2018-01-24 DIAGNOSIS — D638 Anemia in other chronic diseases classified elsewhere: Secondary | ICD-10-CM

## 2018-01-24 DIAGNOSIS — E119 Type 2 diabetes mellitus without complications: Secondary | ICD-10-CM

## 2018-01-24 DIAGNOSIS — D472 Monoclonal gammopathy: Secondary | ICD-10-CM

## 2018-01-24 DIAGNOSIS — Z7982 Long term (current) use of aspirin: Secondary | ICD-10-CM

## 2018-01-24 DIAGNOSIS — N184 Chronic kidney disease, stage 4 (severe): Secondary | ICD-10-CM | POA: Diagnosis not present

## 2018-01-24 DIAGNOSIS — I10 Essential (primary) hypertension: Secondary | ICD-10-CM

## 2018-01-24 DIAGNOSIS — Z794 Long term (current) use of insulin: Secondary | ICD-10-CM

## 2018-01-24 DIAGNOSIS — Z79899 Other long term (current) drug therapy: Secondary | ICD-10-CM | POA: Diagnosis not present

## 2018-01-24 DIAGNOSIS — E669 Obesity, unspecified: Secondary | ICD-10-CM | POA: Diagnosis not present

## 2018-01-24 DIAGNOSIS — D509 Iron deficiency anemia, unspecified: Secondary | ICD-10-CM | POA: Diagnosis not present

## 2018-01-24 LAB — CBC
HEMATOCRIT: 39.8 % (ref 36.0–46.0)
Hemoglobin: 11.4 g/dL — ABNORMAL LOW (ref 12.0–15.0)
MCH: 22.1 pg — ABNORMAL LOW (ref 26.0–34.0)
MCHC: 28.6 g/dL — ABNORMAL LOW (ref 30.0–36.0)
MCV: 77.1 fL — ABNORMAL LOW (ref 80.0–100.0)
NRBC: 0.4 % — AB (ref 0.0–0.2)
PLATELETS: 397 10*3/uL (ref 150–400)
RBC: 5.16 MIL/uL — ABNORMAL HIGH (ref 3.87–5.11)
RDW: 21.4 % — AB (ref 11.5–15.5)
WBC: 7.9 10*3/uL (ref 4.0–10.5)

## 2018-01-24 NOTE — Patient Instructions (Addendum)
[place] at [facility] Discharge Instructions  Follow up in 3 months with labs .  We will change your procrit injections to every other week   Thank you for choosing [place] at [facility] to provide your oncology and hematology care.  To afford each patient quality time with our provider, please arrive at least 15 minutes before your scheduled appointment time.   If you have a lab appointment with the [facility] please come in thru the  Main Entrance and check in at the main information desk  You need to re-schedule your appointment should you arrive 10 or more minutes late.  We strive to give you quality time with our providers, and arriving late affects you and other patients whose appointments are after yours.  Also, if you no show three or more times for appointments you may be dismissed from the clinic at the providers discretion.     Again, thank you for choosing [facility].  Our hope is that these requests will decrease the amount of time that you wait before being seen by our physicians.       _____________________________________________________________  Should you have questions after your visit to [facility], please contact our office at [phone] between the hours of 8:00 a.m. and 4:30 p.m.  Voicemails left after 4:00 p.m. will not be returned until the following business day.  For prescription refill requests, have your pharmacy contact our office and allow 72 hours.    Cancer Center Support Programs:   > Cancer Support Group  2nd Tuesday of the month 1pm-2pm, Journey Room

## 2018-01-24 NOTE — Progress Notes (Signed)
Pt here today for Procrit injection and doctor appt with Dr. Delton Coombes. Labs drawn prior to appt.   HGB 11.4 today. Procrit held.   Discharged from clinic ambulatory. F/U with [facility] as scheduled.

## 2018-01-24 NOTE — Progress Notes (Signed)
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   INTERVAL HISTORY:  Kathryn Beck 62 y.o. female returns for routine follow-up for MGUS and anemia. She is here today with her husband and doing well. She reports she is feeling a lot better as far as energy levels. She is able to be more active throughout the day. She did fall once and denies injuries from is but she does walk with a cane now for stability. She has bilateral lower extremity edema. She reprots her appetite at % and her energy level at %. She is maintaining for weight well. She denies any bleeding or easy bruising. Denies any new pains. Denies any nausea, vomiting, or diarrhea. Denies dizziness or headaches. Denies any SOB.     REVIEW OF SYSTEMS:  Review of Systems  Constitutional: Positive for fatigue.  Cardiovascular: Positive for leg swelling.  Skin: Positive for itching.  Neurological: Positive for extremity weakness and numbness.  All other systems reviewed and are negative.    PAST MEDICAL/SURGICAL HISTORY:  Past Medical History:  Diagnosis Date  . Acid reflux   . Anemia   . Arthritis   . Axillary masses    Soft tissue - status post excision  . Back pain   . CKD (chronic kidney disease) stage 5, GFR less than 15 ml/min (HCC)   . Diastolic heart failure ([place])   . Essential hypertension   . Ischemic heart disease    Abnormal Myoview April 2018 - medical therapy  . Mixed hyperlipidemia   . Obesity   . Pancreatitis   . Sleep apnea    Noncompliant with CPAP  . Type 2 diabetes mellitus, uncontrolled ([place])    Past Surgical History:  Procedure Laterality Date  . ABDOMINAL HYSTERECTOMY    . AV  FISTULA PLACEMENT Left 09/02/2017   Procedure: creation of left arm ARTERIOVENOUS (AV) FISTULA;  Surgeon: Serafina Mitchell, MD;  Location: [facility] OR;  Service: Vascular;  Laterality: Left;  . COLONOSCOPY  2008   Dr. Oneida Alar: normal   . COLONOSCOPY N/A 12/18/2016   Dr. Oneida Alar: multiple tubular adenomas, internal hemorrhoids. Surveillance in 3 years   . ESOPHAGEAL DILATION N/A 10/13/2015   Procedure: ESOPHAGEAL DILATION;  Surgeon: Rogene Houston, MD;  Location: [facility] SUITE;  Service: Endoscopy;  Laterality: N/A;  . ESOPHAGOGASTRODUODENOSCOPY N/A 10/13/2015   Dr. Laural Golden: chronic gastritis on path, no H.pylori. Empiric dilation   . ESOPHAGOGASTRODUODENOSCOPY N/A 12/18/2016   Dr. Oneida Alar: mild gastritis. BRAVO study revealed uncontrolled GERD. Dysphagia secondary to uncontrolled reflux  . FOOT SURGERY Bilateral   . LUNG BIOPSY    . MASS EXCISION Right 01/09/2013   Procedure: EXCISION OF NEOPLASM OF RIGHT  AXILLA  AND EXCISION OF NEOPLASM OF LEFT AXILLA;  Surgeon: Jamesetta So, MD;  Location: [facility] ORS;  Service: General;  Laterality: Right;  procedure end @ 08:23  . MYRINGOTOMY WITH TUBE PLACEMENT Bilateral 04/28/2017   Procedure: BILATERAL MYRINGOTOMY WITH TUBE PLACEMENT;  Surgeon: Leta Baptist, MD;  Location: [facility];  Service: ENT;  Laterality: Bilateral;  . SAVORY DILATION N/A 12/18/2016   Procedure: SAVORY DILATION;  Surgeon: Danie Binder, MD;  Location: [facility];  Service: Endoscopy;  Laterality: N/A;     SOCIAL HISTORY:  Social History   Socioeconomic History  . Marital status: Married    Spouse name: Not on file  . Number of children: Not on file  . Years of education: Not on file  . Highest education level: Not on file  Occupational History  . Not on file  Social Needs  . Financial resource strain: Not on file  . Food insecurity:    Worry: Not on file    Inability: Not on file  . Transportation needs:    Medical: Not on file    Non-medical: Not on file  Tobacco Use  . Smoking  status: Never Smoker  . Smokeless tobacco: Never Used  Substance and Sexual Activity  . Alcohol use: No  . Drug use: No  . Sexual activity: Not Currently  Lifestyle  . Physical activity:    Days per week: Not on file    Minutes per session: Not on file  . Stress: Not on file  Relationships  . Social connections:    Talks on phone: Not on file    Gets together: Not on file    Attends religious service: Not on file    Active member of club or organization: Not on file    Attends meetings of clubs or organizations: Not on file    Relationship status: Not on file  . Intimate partner violence:    Fear of current or ex partner: Not on file    Emotionally abused: Not on file    Physically abused: Not on file    Forced sexual activity: Not on file  Other Topics Concern  . Not on file  Social History Narrative  . Not on file    FAMILY HISTORY:  Family History  Problem Relation Age of Onset  . Hypertension Father   . Hypercholesterolemia Father   . Arthritis Father   . Hypertension Sister   . Hypercholesterolemia Sister   . Breast cancer Sister   . Hypertension Sister   . Colon cancer Neg Hx   . Colon polyps Neg Hx     CURRENT MEDICATIONS:  Outpatient Encounter Medications as of 01/24/2018  Medication Sig  . acetaminophen (TYLENOL) 500 MG tablet Take by mouth.  . ALPRAZolam (XANAX) 0.25 MG tablet Take 1 tablet (0.25 mg total) by mouth at bedtime as needed for anxiety.  Marland Kitchen amLODipine (NORVASC) 10 MG tablet   . aspirin EC 81 MG tablet Take 1 tablet (81 mg total) by mouth daily.  Marland Kitchen azelastine (ASTELIN) 0.1 % nasal spray Place 2 sprays into both nostrils 2 (two) times daily. Use in each nostril as directed  . Calcifediol ER 30 MCG CPCR Take by mouth.  . calcitRIOL (ROCALTROL) 0.25 MCG capsule Take 0.25 mcg by mouth every Monday, Wednesday, and Friday.   . Calcium Carbonate Antacid (CALCIUM CARBONATE, DOSED IN MG ELEMENTAL CALCIUM,) 1250 MG/5ML SUSP Take 5 mLs (500 mg of  elemental calcium total) by mouth every 6 (six) hours as needed for indigestion.  . camphor-menthol (SARNA) lotion Apply 1 application topically every 8 (eight) hours as needed for itching.  . cloNIDine (CATAPRES) 0.3 MG tablet Take one tablet two times daily  . dexamethasone (DECADRON) 1 MG tablet Take 1 tablet by mouth once at 11 pm, before coming for labs at 8 am the next morning  . dexlansoprazole (DEXILANT) 60 MG capsule TAKE 1 CAPSULE IN THE MORNING WITH FOOD  . ezetimibe (ZETIA) 10 MG tablet Take 1 tablet (  10 mg total) by mouth daily.  Marland Kitchen gabapentin (NEURONTIN) 100 MG capsule TAKE (1) CAPSULE BY MOUTH TWICE DAILY.  . hydrochlorothiazide (MICROZIDE) 12.5 MG capsule TAKE (1) CAPSULE BY MOUTH ONCE DAILY.  Marland Kitchen Insulin Detemir (LEVEMIR FLEXTOUCH) 100 UNIT/ML Pen INJECT 50 UNITS INTO THE SKIN TWICE DAILY  . insulin lispro (HUMALOG KWIKPEN) 100 UNIT/ML KiwkPen INJECT 36-40 units 3x a day before meals  . isosorbide mononitrate (IMDUR) 30 MG 24 hr tablet Take 1 tablet (30 mg total) by mouth daily.  Marland Kitchen olmesartan (BENICAR) 40 MG tablet Take 40 mg by mouth daily.  Marland Kitchen senna-docusate (SENOKOT-S) 8.6-50 MG tablet Take 2 tablets by mouth at bedtime as needed for mild constipation or moderate constipation.  . sertraline (ZOLOFT) 25 MG tablet TAKE 1 TABLET BY MOUTH ONCE DAILY.  . sevelamer carbonate (RENVELA) 800 MG tablet   . torsemide (DEMADEX) 20 MG tablet TAKE (2) TABLETS BY MOUTH ONCE DAILY AS NEEDED (EDEMA OR WEIGHT GAIN;3 POUNDS IN A DAY OR 5 POUNDS IN A WEEK).  . traMADol (ULTRAM) 50 MG tablet TAKE ONE TABLET BY MOUTH AT BEDTIME. MAY TAKE 1 TABLET UP TO EVERY 6 HOURS AS NEEDED FOR PAIN. (MAX OF 4 PER DAY)  . ULTICARE MINI PEN NEEDLES 31G X 6 MM MISC USE AS DIRECTED  . Vitamin D, Ergocalciferol, (DRISDOL) 50000 units CAPS capsule TAKE 1 CAPSULE BY MOUTH ONCE A WEEK  . [DISCONTINUED] FLUoxetine (PROZAC) 10 MG capsule Take 10 mg by mouth daily.    . [DISCONTINUED] glipiZIDE (GLUCOTROL) 10 MG tablet Take 10  mg by mouth 2 (two) times daily before a meal.     No facility-administered encounter medications on file as of 01/24/2018.     ALLERGIES:  Allergies  Allergen Reactions  . Ace Inhibitors Anaphylaxis and Swelling  . Penicillins Itching, Swelling and Other (See Comments)    SWELLING REACTION UNSPECIFIED   PATIENT HAS HAD A PCN REACTION WITH IMMEDIATE RASH, FACIAL/TONGUE/THROAT SWELLING, SOB, OR LIGHTHEADEDNESS WITH HYPOTENSION:  #  #  YES  #  #  Has patient had a PCN reaction causing severe rash involving mucus membranes or skin necrosis: No Has patient had a PCN reaction that required hospitalization No Has patient had a PCN reaction occurring within the last 10 years: No If all of the above answers are "NO", then may proceed with Cephalosporin use.   . Statins Other (See Comments)    elevated LFT's  . Albuterol Swelling    SWELLING REACTION UNSPECIFIED      PHYSICAL EXAM:  ECOG Performance status: 1  Vitals:   01/24/18 1152  BP: (!) 168/71  Pulse: 68  Resp: 18  Temp: 98.1 F (36.7 C)  SpO2: 98%   Filed Weights   01/24/18 1152  Weight: 209 lb 8 oz (95 kg)    Physical Exam  Constitutional: She is oriented to person, place, and time. She appears well-developed and well-nourished.  Musculoskeletal: Normal range of motion.  Neurological: She is alert and oriented to person, place, and time.  Skin: Skin is warm and dry.  Psychiatric: She has a normal mood and affect. Her behavior is normal. Judgment and thought content normal.  Extremities: 2+ edema   LABORATORY DATA:  I have reviewed the labs as listed.  CBC    Component Value Date/Time   WBC 7.9 01/24/2018 1054   RBC 5.16 (H) 01/24/2018 1054   HGB 11.4 (L) 01/24/2018 1054   HCT 39.8 01/24/2018 1054   PLT 397 01/24/2018 1054   MCV  77.1 (L) 01/24/2018 1054   MCH 22.1 (L) 01/24/2018 1054   MCHC 28.6 (L) 01/24/2018 1054   RDW 21.4 (H) 01/24/2018 1054   LYMPHSABS 2.6 01/17/2018 0934   MONOABS 0.5 01/17/2018  0934   EOSABS 0.1 01/17/2018 0934   BASOSABS 0.0 01/17/2018 0934   CMP Latest Ref Rng & Units 01/17/2018 11/30/2017 11/26/2017  Glucose 70 - 99 mg/dL 104(H) - 233(H)  BUN 8 - 23 mg/dL 77(H) - 51(H)  Creatinine 0.44 - 1.00 mg/dL 4.64(H) - 3.10(H)  Sodium 135 - 145 mmol/L 141 - 142  Potassium 3.5 - 5.1 mmol/L 4.4 4.0 4.5  Chloride 98 - 111 mmol/L 109 - 110  CO2 22 - 32 mmol/L 20(L) - 24  Calcium 8.9 - 10.3 mg/dL 9.0 - 8.9  Total Protein 6.5 - 8.1 g/dL 6.7 - 6.8  Total Bilirubin 0.3 - 1.2 mg/dL 0.5 - 0.6  Alkaline Phos 38 - 126 U/L 86 - 121  AST 15 - 41 U/L 20 - 18  ALT 0 - 44 U/L 26 - 34       DIAGNOSTIC IMAGING:  I have independently reviewed images dated 11/05/2017.    ASSESSMENT & PLAN:   MGUS (monoclonal gammopathy of unknown significance) 1.  Monoclonal gammopathy of uncertain significance: - Patient with history of CKD stage IV, work-up for monoclonal gammopathy on 08/09/2017 showed an M spike of 0.1 g/dL of IgG kappa monoclonal protein.  Free kappa light chains were elevated at 171 and lambda light chains 75.7.  Ratio was 2.27.  24-hour urine showed nephrotic range proteinuria.  This was 18 g.  -Serum immunofixation shows polyclonal gammopathy with IgA kappa and lambda typing appear increased. - M spike was 0.3, free light chain ratio 2.33.  Beta-2 microglobulin was 9.9. -Skeletal survey on 11/05/2017 was negative for lytic lesions -We will monitor M spike labs once every 4 to 6 months.  2.  CKD: Thought to be secondary to long history of diabetes, hypertension, obesity related glomerulopathy. -She follows up with nephrology for her kidney disease.  3.  Microcytic anemia: - She did receive 2 units of PRBC during hospitalization in June.  She also received Feraheme at that time. -We give her him in our office on 11/29/2017.  She was started on weekly Procrit 20,000 units on 11/26/2017.  Hemoglobin traverse the end of September has improved and been staying. -She is requiring  Procrit once every 2 weeks. -She will be seen back in 3 months with a repeat ferritin, iron panel.      Orders placed this encounter:  Orders Placed This Encounter  Procedures  . Lactate dehydrogenase  . Protein electrophoresis, serum  . Immunofixation electrophoresis  . Kappa/lambda light chains  . CBC with Differential/Platelet  . Comprehensive metabolic panel  . Vitamin B12  . Folate  . Ferritin  . Iron and TIBC      Derek Jack, MD [place] [phone]

## 2018-01-24 NOTE — Assessment & Plan Note (Signed)
1.  Monoclonal gammopathy of uncertain significance: - Patient with history of CKD stage IV, work-up for monoclonal gammopathy on 08/09/2017 showed an M spike of 0.1 g/dL of IgG kappa monoclonal protein.  Free kappa light chains were elevated at 171 and lambda light chains 75.7.  Ratio was 2.27.  24-hour urine showed nephrotic range proteinuria.  This was 18 g.  -Serum immunofixation shows polyclonal gammopathy with IgA kappa and lambda typing appear increased. - M spike was 0.3, free light chain ratio 2.33.  Beta-2 microglobulin was 9.9. -Skeletal survey on 11/05/2017 was negative for lytic lesions -We will monitor M spike labs once every 4 to 6 months.  2.  CKD: Thought to be secondary to long history of diabetes, hypertension, obesity related glomerulopathy. -She follows up with nephrology for her kidney disease.  3.  Microcytic anemia: - She did receive 2 units of PRBC during hospitalization in June.  She also received Feraheme at that time. -We give her him in our office on 11/29/2017.  She was started on weekly Procrit 20,000 units on 11/26/2017.  Hemoglobin traverse the end of September has improved and been staying. -She is requiring Procrit once every 2 weeks. -She will be seen back in 3 months with a repeat ferritin, iron panel.

## 2018-01-25 ENCOUNTER — Ambulatory Visit (HOSPITAL_COMMUNITY): Payer: Self-pay | Admitting: Hematology

## 2018-01-31 ENCOUNTER — Other Ambulatory Visit: Payer: Self-pay

## 2018-01-31 DIAGNOSIS — N185 Chronic kidney disease, stage 5: Secondary | ICD-10-CM

## 2018-02-01 ENCOUNTER — Ambulatory Visit (INDEPENDENT_AMBULATORY_CARE_PROVIDER_SITE_OTHER): Payer: 59 | Admitting: Family Medicine

## 2018-02-01 ENCOUNTER — Encounter: Payer: Self-pay | Admitting: Family Medicine

## 2018-02-01 VITALS — BP 140/78 | HR 71 | Resp 16 | Ht 64.0 in | Wt 220.0 lb

## 2018-02-01 DIAGNOSIS — M5410 Radiculopathy, site unspecified: Secondary | ICD-10-CM | POA: Diagnosis not present

## 2018-02-01 DIAGNOSIS — IMO0002 Reserved for concepts with insufficient information to code with codable children: Secondary | ICD-10-CM

## 2018-02-01 DIAGNOSIS — I10 Essential (primary) hypertension: Secondary | ICD-10-CM

## 2018-02-01 DIAGNOSIS — J0100 Acute maxillary sinusitis, unspecified: Secondary | ICD-10-CM

## 2018-02-01 DIAGNOSIS — E1165 Type 2 diabetes mellitus with hyperglycemia: Secondary | ICD-10-CM

## 2018-02-01 DIAGNOSIS — E1121 Type 2 diabetes mellitus with diabetic nephropathy: Secondary | ICD-10-CM

## 2018-02-01 MED ORDER — AZITHROMYCIN 250 MG PO TABS: ORAL_TABLET | ORAL | 0 refills | Status: DC

## 2018-02-01 MED ORDER — TRAMADOL HCL 50 MG PO TABS: ORAL_TABLET | ORAL | 3 refills | Status: DC

## 2018-02-01 MED ORDER — CLONIDINE HCL 0.3 MG PO TABS: 0.3000 mg | ORAL_TABLET | Freq: Three times a day (TID) | ORAL | 3 refills | Status: DC

## 2018-02-01 NOTE — Patient Instructions (Addendum)
Please change the December visit to a physical with mD  Please schedule her mammogram at checkout between 10 to `12, or morning preferred)  Wellness with nurse is overdue, need this before year end if possible'  We will obtain your eye exam   INCREASE clonidine to THREE times daily, every 8 hours, 6am, 2 pm and 10 pm  Please stop any pain med except tyle`nol and trmadol which I will prescribe  No sodas, fast food, A LOT of vegetables , salads, fresh fruit  Drink only water, no more sodas!  Azithromycin is prescribed for your sinus infection  Tramadol one at bedtime is prescribed

## 2018-02-01 NOTE — Progress Notes (Signed)
01/17/2018  1   01/17/2018  Alprazolam 0.25 Mg Tablet  1.00 1 [address]  [id number]  [place] [phone]  0/0 0.50 LME Comm Ins  [place]  11/24/2017  1   10/26/2017  Tramadol Hcl 50 Mg Tablet  30.00 8 Ma Sim  [id number]  [place] [id number]  0/0 18.75 MME Comm Ins  [place]  11/01/2017  1   11/01/2017  Acetaminophen-Cod #3 Tablet  30.00 [address]  [id number]  [place] ([id number])  0/0 19.29 MME Comm Ins  [place]  10/20/2017  1   10/20/2017  Tramadol Hcl 50 Mg Tablet  30.00 8 Ma Sim  [id number]  [place] ([id number])  0/0 18.75 MME Comm Ins  [place]  10/01/2017  1   10/01/2017  Tramadol Hcl 50 Mg Tablet  30.00 8 An [place]  [id number]  [place] ([id number])  0/0 18.75 MME Comm Ins  [place]  09/28/2017  2   09/27/2017  Tramadol Hcl 50 Mg Tablet  45.00 9 An [place]  [id number]  Hol ([id number])  0/0 25.00 MME Medicare  [place]  09/09/2017  2   09/09/2017  Tramadol Hcl 50 Mg Tablet  30.00 15 An [place]  [id number]  Hol ([id number])  0/0 10.00 MME Medicare  [place]  06/10/2017  3   06/09/2017  Hydrocodone-Acetamin 5-325 Mg  15.00 4 [address] [place]  [id number]  [place] ([id number])  0/0 18.75 MME Comm Ins  [place]

## 2018-02-06 ENCOUNTER — Emergency Department (HOSPITAL_COMMUNITY)
Admission: EM | Admit: 2018-02-06 | Discharge: 2018-02-06 | Disposition: A | Discharge status: Home / Self Care | Payer: 59 | Attending: Emergency Medicine | Admitting: Emergency Medicine

## 2018-02-06 ENCOUNTER — Emergency Department (HOSPITAL_COMMUNITY): Payer: 59

## 2018-02-06 ENCOUNTER — Other Ambulatory Visit: Payer: Self-pay

## 2018-02-06 ENCOUNTER — Encounter (HOSPITAL_COMMUNITY): Payer: Self-pay | Admitting: Emergency Medicine

## 2018-02-06 DIAGNOSIS — J189 Pneumonia, unspecified organism: Secondary | ICD-10-CM | POA: Insufficient documentation

## 2018-02-06 DIAGNOSIS — N185 Chronic kidney disease, stage 5: Secondary | ICD-10-CM | POA: Diagnosis not present

## 2018-02-06 DIAGNOSIS — I132 Hypertensive heart and chronic kidney disease with heart failure and with stage 5 chronic kidney disease, or end stage renal disease: Secondary | ICD-10-CM | POA: Insufficient documentation

## 2018-02-06 DIAGNOSIS — E1122 Type 2 diabetes mellitus with diabetic chronic kidney disease: Secondary | ICD-10-CM | POA: Insufficient documentation

## 2018-02-06 DIAGNOSIS — R0602 Shortness of breath: Secondary | ICD-10-CM | POA: Diagnosis present

## 2018-02-06 DIAGNOSIS — Z7982 Long term (current) use of aspirin: Secondary | ICD-10-CM | POA: Insufficient documentation

## 2018-02-06 DIAGNOSIS — Z79899 Other long term (current) drug therapy: Secondary | ICD-10-CM | POA: Insufficient documentation

## 2018-02-06 DIAGNOSIS — Z794 Long term (current) use of insulin: Secondary | ICD-10-CM | POA: Insufficient documentation

## 2018-02-06 DIAGNOSIS — I5032 Chronic diastolic (congestive) heart failure: Secondary | ICD-10-CM | POA: Insufficient documentation

## 2018-02-06 LAB — BASIC METABOLIC PANEL
Anion gap: 7 (ref 5–15)
BUN: 78 mg/dL — ABNORMAL HIGH (ref 8–23)
CO2: 21 mmol/L — ABNORMAL LOW (ref 22–32)
Calcium: 9 mg/dL (ref 8.9–10.3)
Chloride: 113 mmol/L — ABNORMAL HIGH (ref 98–111)
Creatinine, Ser: 4.69 mg/dL — ABNORMAL HIGH (ref 0.44–1.00)
GFR calc Af Amer: 11 mL/min — ABNORMAL LOW (ref >60)
GFR calc non Af Amer: 9 mL/min — ABNORMAL LOW (ref >60)
Glucose, Bld: 70 mg/dL (ref 70–99)
Potassium: 5 mmol/L (ref 3.5–5.1)
Sodium: 141 mmol/L (ref 135–145)

## 2018-02-06 LAB — BRAIN NATRIURETIC PEPTIDE: B Natriuretic Peptide: 187 pg/mL — ABNORMAL HIGH (ref 0.0–100.0)

## 2018-02-06 LAB — CBC WITH DIFFERENTIAL/PLATELET
Abs Immature Granulocytes: 0.02 10*3/uL (ref 0.00–0.07)
Basophils Absolute: 0 10*3/uL (ref 0.0–0.1)
Basophils Relative: 1 %
Eosinophils Absolute: 0.1 10*3/uL (ref 0.0–0.5)
Eosinophils Relative: 2 %
HCT: 35.7 % — ABNORMAL LOW (ref 36.0–46.0)
Hemoglobin: 10.2 g/dL — ABNORMAL LOW (ref 12.0–15.0)
Immature Granulocytes: 0 %
Lymphocytes Relative: 35 %
Lymphs Abs: 2.2 10*3/uL (ref 0.7–4.0)
MCH: 22.1 pg — ABNORMAL LOW (ref 26.0–34.0)
MCHC: 28.6 g/dL — ABNORMAL LOW (ref 30.0–36.0)
MCV: 77.4 fL — ABNORMAL LOW (ref 80.0–100.0)
Monocytes Absolute: 0.7 10*3/uL (ref 0.1–1.0)
Monocytes Relative: 11 %
Neutro Abs: 3.3 10*3/uL (ref 1.7–7.7)
Neutrophils Relative %: 51 %
Platelets: 281 10*3/uL (ref 150–400)
RBC: 4.61 MIL/uL (ref 3.87–5.11)
RDW: 19.9 % — ABNORMAL HIGH (ref 11.5–15.5)
WBC: 6.4 10*3/uL (ref 4.0–10.5)
nRBC: 0 % (ref 0.0–0.2)

## 2018-02-06 LAB — CBG MONITORING, ED: Glucose-Capillary: 58 mg/dL — ABNORMAL LOW (ref 70–99)

## 2018-02-06 LAB — TROPONIN I: Troponin I: <0.03 ng/mL (ref <0.03)

## 2018-02-06 MED ORDER — BENZONATATE 100 MG PO CAPS: 100.0000 mg | ORAL_CAPSULE | Freq: Two times a day (BID) | ORAL | 0 refills | Status: DC | PRN

## 2018-02-06 MED ORDER — LEVOFLOXACIN 250 MG PO TABS: 250.0000 mg | ORAL_TABLET | Freq: Every day | ORAL | 0 refills | Status: DC

## 2018-02-06 MED ORDER — LEVOFLOXACIN IN D5W 500 MG/100ML IV SOLN
500.0000 mg | Freq: Once | INTRAVENOUS | Status: AC
  Administered 2018-02-06: 500 mg via INTRAVENOUS
  Filled 2018-02-06: qty 100

## 2018-02-06 MED ORDER — FUROSEMIDE 10 MG/ML IJ SOLN
80.0000 mg | Freq: Once | INTRAMUSCULAR | Status: AC
  Administered 2018-02-06: 80 mg via INTRAVENOUS
  Filled 2018-02-06: qty 8

## 2018-02-06 NOTE — ED Triage Notes (Signed)
Sob and wheezing for past few days

## 2018-02-06 NOTE — ED Provider Notes (Signed)
[facility] EMERGENCY DEPARTMENT Provider Note   CSN: [id number] Arrival date & time: 02/06/18  7564     History   Chief Complaint Chief Complaint  Patient presents with  . Shortness of Breath    HPI Kathryn Beck is a 62 y.o. female.  HPI   62yF with cough and shortness of breath. Some wheezing. Onset about 3 d ago. Persistent since. Cough nonproductive. No fever or chills. No unusual leg pain or swelling. No fever. No n/v.  Past Medical History:  Diagnosis Date  . Acid reflux   . Anemia   . Arthritis   . Axillary masses    Soft tissue - status post excision  . Back pain   . CKD (chronic kidney disease) stage 5, GFR less than 15 ml/min (HCC)   . Diastolic heart failure ([place])   . Essential hypertension   . Ischemic heart disease    Abnormal Myoview April 2018 - medical therapy  . Mixed hyperlipidemia   . Obesity   . Pancreatitis   . Sleep apnea    Noncompliant with CPAP  . Type 2 diabetes mellitus, uncontrolled ([place])     Patient Active Problem List   Diagnosis Date Noted  . Acute pain of left knee 12/20/2017  . Adrenal mass, left ([place]) 11/09/2017  . MGUS (monoclonal gammopathy of unknown significance) 11/05/2017  . Chronic diastolic CHF (congestive heart failure) ([place]) 09/20/2017  . Bilateral leg weakness 09/09/2017  . Adrenal adenoma 09/03/2017  . Shortness of breath   . 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 Unsteady gait 07/11/2017  . Recurrent falls 07/11/2017  . Anemia of chronic disease 03/24/2017  . History of colonic polyps 02/10/2017  . Dysphagia 11/05/2016  . IBS (irritable bowel syndrome) 02/04/2016  . Postprandial epigastric pain   . Heat sensitivity 10/07/2014  . Cardiac murmur 01/17/2014  . Back pain with left-sided radiculopathy 09/18/2013  . Hyperuricemia 08/04/2013  . Seasonal allergies 03/10/2013  . Lipoma of back 12/22/2012  . GERD (gastroesophageal reflux disease) 08/22/2012  . Sleep apnea  06/02/2011  . CKD (chronic kidney disease) stage 4, GFR 15-29 ml/min (HCC) 01/13/2011  . FATIGUE 07/24/2009  . LIVER FUNCTION TESTS, ABNORMAL, HX OF 05/08/2009  . LUPUS ERYTHEMATOSUS, DISCOID 06/05/2008  . Type 2 diabetes mellitus with stage 4 chronic kidney disease, with long-term current use of insulin ([place]) 06/07/2007  . Mixed hyperlipidemia 06/07/2007  . Morbid obesity ([place]) 06/07/2007  . Malignant hypertension 06/07/2007    Past Surgical History:  Procedure Laterality Date  . ABDOMINAL HYSTERECTOMY    . AV FISTULA PLACEMENT Left 09/02/2017   Procedure: creation of left arm ARTERIOVENOUS (AV) FISTULA;  Surgeon: Serafina Mitchell, MD;  Location: [facility] OR;  Service: Vascular;  Laterality: Left;  . COLONOSCOPY  2008   Dr. Oneida Alar: normal   . COLONOSCOPY N/A 12/18/2016   Dr. Oneida Alar: multiple tubular adenomas, internal hemorrhoids. Surveillance in 3 years   . ESOPHAGEAL DILATION N/A 10/13/2015   Procedure: ESOPHAGEAL DILATION;  Surgeon: Rogene Houston, MD;  Location: [facility] ENDO SUITE;  Service: Endoscopy;  Laterality: N/A;  . ESOPHAGOGASTRODUODENOSCOPY N/A 10/13/2015   Dr. Laural Golden: chronic gastritis on path, no H.pylori. Empiric dilation   . ESOPHAGOGASTRODUODENOSCOPY N/A 12/18/2016   Dr. Oneida Alar: mild gastritis. BRAVO study revealed uncontrolled GERD. Dysphagia secondary to uncontrolled reflux  . FOOT SURGERY Bilateral   . LUNG BIOPSY    . MASS EXCISION Right 01/09/2013   Procedure: EXCISION OF NEOPLASM OF RIGHT  AXILLA  AND EXCISION OF NEOPLASM OF LEFT AXILLA;  Surgeon: Jamesetta So, MD;  Location: [facility] ORS;  Service: General;  Laterality: Right;  procedure end @ 08:23  . MYRINGOTOMY WITH TUBE PLACEMENT Bilateral 04/28/2017   Procedure: BILATERAL MYRINGOTOMY WITH TUBE PLACEMENT;  Surgeon: Leta Baptist, MD;  Location: [place];  Service: ENT;  Laterality: Bilateral;  . SAVORY DILATION N/A 12/18/2016   Procedure: SAVORY DILATION;  Surgeon: Danie Binder, MD;  Location: [facility] ENDO SUITE;  Service: Endoscopy;   Laterality: N/A;     OB History   None      Home Medications    Prior to Admission medications   Medication Sig Start Date End Date Taking? Authorizing Provider  acetaminophen (TYLENOL) 500 MG tablet Take by mouth.   Yes [provider]  ALPRAZolam (XANAX) 0.25 MG tablet Take 1 tablet (0.25 mg total) by mouth at bedtime as needed for anxiety. 01/17/18  Yes Carole Civil, MD  amLODipine (NORVASC) 10 MG tablet Take 10 mg by mouth daily.  01/18/18  Yes [provider]  aspirin EC 81 MG tablet Take 1 tablet (81 mg total) by mouth daily. 01/03/18  Yes Satira Sark, MD  azithromycin [facility]) 250 MG tablet Two tablets on day one then one tablet once daily for an additional four days 02/01/18  Yes Fayrene Helper, MD  Calcifediol ER 30 MCG CPCR Take 1 capsule by mouth daily.  01/14/18 03/16/18 Yes [provider]  calcitRIOL (ROCALTROL) 0.25 MCG capsule Take 0.25 mcg by mouth every Monday, Wednesday, and Friday.  09/15/16  Yes Fayrene Helper, MD  Calcium Carbonate Antacid (CALCIUM CARBONATE, DOSED IN MG ELEMENTAL CALCIUM,) 1250 MG/5ML SUSP Take 5 mLs (500 mg of elemental calcium total) by mouth every 6 (six) hours as needed for indigestion. 09/08/17  Yes Roxan Hockey, MD  camphor-menthol (SARNA) lotion Apply 1 application topically every 8 (eight) hours as needed for itching. 09/08/17  Yes Emokpae, Courage, MD  cloNIDine (CATAPRES) 0.3 MG tablet Take 1 tablet (0.3 mg total) by mouth 3 (three) times daily. 02/01/18 02/02/19 Yes Fayrene Helper, MD  dexlansoprazole (DEXILANT) 60 MG capsule TAKE 1 CAPSULE IN THE MORNING WITH FOOD 11/24/17  Yes Fayrene Helper, MD  ezetimibe (ZETIA) 10 MG tablet Take 1 tablet (10 mg total) by mouth daily. 11/29/17  Yes Fayrene Helper, MD  gabapentin (NEURONTIN) 100 MG capsule TAKE (1) CAPSULE BY MOUTH TWICE DAILY. 11/24/17  Yes Fayrene Helper, MD  hydrochlorothiazide (MICROZIDE) 12.5 MG capsule TAKE (1)  CAPSULE BY MOUTH ONCE DAILY. 12/21/17  Yes Fayrene Helper, MD  Insulin Detemir (LEVEMIR FLEXTOUCH) 100 UNIT/ML Pen INJECT 50 UNITS INTO THE SKIN TWICE DAILY 11/30/17  Yes [name], MD  insulin lispro (HUMALOG KWIKPEN) 100 UNIT/ML KiwkPen INJECT 36-40 units 3x a day before meals 11/30/17  Yes [name], MD  isosorbide mononitrate (IMDUR) 30 MG 24 hr tablet Take 1 tablet (30 mg total) by mouth daily. 11/29/17  Yes Fayrene Helper, MD  olmesartan (BENICAR) 40 MG tablet Take 40 mg by mouth daily. 12/01/17  Yes [provider]  sertraline (ZOLOFT) 25 MG tablet TAKE 1 TABLET BY MOUTH ONCE DAILY. 11/24/17  Yes Fayrene Helper, MD  sevelamer carbonate (RENVELA) 800 MG tablet Take 800 mg by mouth 3 (three) times daily with meals.  11/30/17  Yes [provider]  torsemide (DEMADEX) 20 MG tablet TAKE (2) TABLETS BY MOUTH ONCE DAILY AS NEEDED (EDEMA OR WEIGHT GAIN;3 POUNDS IN A  DAY OR 5 POUNDS IN A WEEK). 11/24/17  Yes Fayrene Helper, MD  traMADol Veatrice Bourbon) 50 MG tablet One tablet at bedtime for neck and arm pain 02/01/18  Yes Fayrene Helper, MD  ULTICARE MINI PEN NEEDLES 31G X 6 MM MISC USE AS DIRECTED 10/20/17  Yes Fayrene Helper, MD  Vitamin D, Ergocalciferol, (DRISDOL) 50000 units CAPS capsule TAKE 1 CAPSULE BY MOUTH ONCE A WEEK 12/21/17  Yes Fayrene Helper, MD  azelastine (ASTELIN) 0.1 % nasal spray Place 2 sprays into both nostrils 2 (two) times daily. Use in each nostril as directed Patient not taking: Reported on 02/06/2018 05/31/17   Fayrene Helper, MD  senna-docusate (SENOKOT-S) 8.6-50 MG tablet Take 2 tablets by mouth at bedtime as needed for mild constipation or moderate constipation. Patient not taking: Reported on 02/06/2018 09/08/17 09/08/18  Roxan Hockey, MD  FLUoxetine (PROZAC) 10 MG capsule Take 10 mg by mouth daily.    05/28/11  [provider]  glipiZIDE (GLUCOTROL) 10 MG tablet Take 10 mg by mouth 2 (two) times daily before a  meal.    05/28/11  [provider]    Family History Family History  Problem Relation Age of Onset  . Hypertension Father   . Hypercholesterolemia Father   . Arthritis Father   . Hypertension Sister   . Hypercholesterolemia Sister   . Breast cancer Sister   . Hypertension Sister   . Colon cancer Neg Hx   . Colon polyps Neg Hx     Social History Social History   Tobacco Use  . Smoking status: Never Smoker  . Smokeless tobacco: Never Used  Substance Use Topics  . Alcohol use: No  . Drug use: No     Allergies   Ace inhibitors; Penicillins; Statins; and Albuterol   Review of Systems Review of Systems  All systems reviewed and negative, other than as noted in HPI.  Physical Exam Updated Vital Signs BP 131/61 (BP Location: Right Arm)   Pulse (!) 56   Temp (!) 97.5 F (36.4 C) (Oral)   Resp 19   Ht 5\' 4"  (1.626 m)   Wt 99.7 kg   SpO2 100%   BMI 37.73 kg/m   Physical Exam  Constitutional: She appears well-developed and well-nourished. No distress.  HENT:  Head: Normocephalic and atraumatic.  Eyes: Conjunctivae are normal. Right eye exhibits no discharge. Left eye exhibits no discharge.  Neck: Neck supple.  Cardiovascular: Normal rate, regular rhythm and normal heart sounds. Exam reveals no gallop and no friction rub.  No murmur heard. Pulmonary/Chest: Effort normal. No respiratory distress. She has wheezes.  Abdominal: Soft. She exhibits no distension. There is no tenderness.  Musculoskeletal: She exhibits no edema or tenderness.  Neurological: She is alert.  Skin: Skin is warm and dry.  Psychiatric: She has a normal mood and affect. Her behavior is normal. Thought content normal.  Nursing note and vitals reviewed.    ED Treatments / Results  Labs (all labs ordered are listed, but only abnormal results are displayed) Labs Reviewed  CBC WITH DIFFERENTIAL/PLATELET - Abnormal; Notable for the following components:      Result Value   Hemoglobin  10.2 (*)    HCT 35.7 (*)    MCV 77.4 (*)    MCH 22.1 (*)    MCHC 28.6 (*)    RDW 19.9 (*)    All other components within normal limits  BASIC METABOLIC PANEL - Abnormal; Notable for the following components:  Chloride 113 (*)    CO2 21 (*)    BUN 78 (*)    Creatinine, Ser 4.69 (*)    GFR calc non Af Amer 9 (*)    GFR calc Af Amer 11 (*)    All other components within normal limits  BRAIN NATRIURETIC PEPTIDE - Abnormal; Notable for the following components:   B Natriuretic Peptide 187.0 (*)    All other components within normal limits  TROPONIN I    EKG None  Radiology Dg Chest 2 View  Result Date: 02/06/2018 CLINICAL DATA:  Shortness of breath and wheezing for a few days EXAM: CHEST - 2 VIEW COMPARISON:  09/19/2017 FINDINGS: Cardiac shadow remains enlarged. The lungs are well aerated bilaterally. Mild vascular congestion is seen. Coarsened interstitial markings are again identified and stable. Mild patchy infiltrate is noted in the left lung base IMPRESSION: Chronic interstitial markings with acute mild superimposed left basilar infiltrate. Electronically Signed   By: Inez Catalina M.D.   On: 02/06/2018 10:44    Procedures Procedures (including critical care time)  Medications Ordered in ED Medications  furosemide (LASIX) injection 80 mg (80 mg Intravenous Given 02/06/18 1155)  levofloxacin (LEVAQUIN) IVPB 500 mg (0 mg Intravenous Stopped 02/06/18 1325)     Initial Impression / Assessment and Plan / ED Course  I have reviewed the triage vital signs and the nursing notes.  Pertinent labs & imaging results that were available during my care of the patient were reviewed by me and considered in my medical decision making (see chart for details).     62yF with dyspnea and cough.   CXR with infiltrate. WOB not increased at least at rest. o2 sats good.   Final Clinical Impressions(s) / ED Diagnoses   Final diagnoses:  Community acquired pneumonia, unspecified  laterality    ED Discharge Orders         Ordered    levofloxacin (LEVAQUIN) 250 MG tablet  Daily     02/06/18 1250    benzonatate (TESSALON) 100 MG capsule  2 times daily PRN     02/06/18 1325           Virgel Manifold, MD 02/11/18 1111

## 2018-02-07 ENCOUNTER — Inpatient Hospital Stay (HOSPITAL_COMMUNITY): Payer: 59

## 2018-02-07 VITALS — BP 158/67 | HR 64 | Temp 97.6°F | Resp 18

## 2018-02-07 DIAGNOSIS — D472 Monoclonal gammopathy: Secondary | ICD-10-CM

## 2018-02-07 DIAGNOSIS — N184 Chronic kidney disease, stage 4 (severe): Secondary | ICD-10-CM

## 2018-02-07 DIAGNOSIS — D638 Anemia in other chronic diseases classified elsewhere: Secondary | ICD-10-CM

## 2018-02-07 LAB — CBC
HEMATOCRIT: 36.7 % (ref 36.0–46.0)
Hemoglobin: 10.6 g/dL — ABNORMAL LOW (ref 12.0–15.0)
MCH: 22.2 pg — AB (ref 26.0–34.0)
MCHC: 28.9 g/dL — AB (ref 30.0–36.0)
MCV: 76.9 fL — ABNORMAL LOW (ref 80.0–100.0)
Platelets: 299 10*3/uL (ref 150–400)
RBC: 4.77 MIL/uL (ref 3.87–5.11)
RDW: 19.8 % — ABNORMAL HIGH (ref 11.5–15.5)
WBC: 6.9 10*3/uL (ref 4.0–10.5)
nRBC: 0 % (ref 0.0–0.2)

## 2018-02-07 MED ORDER — EPOETIN ALFA 20000 UNIT/ML IJ SOLN
20000.0000 [IU] | Freq: Once | INTRAMUSCULAR | Status: AC
  Administered 2018-02-07: 20000 [IU] via SUBCUTANEOUS

## 2018-02-07 NOTE — Progress Notes (Signed)
Kathryn Beck presents today for Procrit injection. Hgb 10.6, VSS. Pt tolerated injection without incident or complaint. Discharged via wheelchair in company of family member.

## 2018-02-07 NOTE — Patient Instructions (Signed)
[facility] _______________________________________________________________  Thank you for choosing [facility] to provide your oncology and hematology care.  To afford each patient quality time with our providers, please arrive at least 15 minutes before your scheduled appointment.  You need to re-schedule your appointment if you arrive 10 or more minutes late.  We strive to give you quality time with our providers, and arriving late affects you and other patients whose appointments are after yours.  Also, if you no show three or more times for appointments you may be dismissed from the clinic.  Again, thank you for choosing [facility]. Our hope is that these requests will allow you access to exceptional care and in a timely manner. _______________________________________________________________  If you have questions after your visit, please contact our office at [phone] between the hours of 8:30 a.m. and 5:00 p.m. Voicemails left after 4:30 p.m. will not be returned until the following business day. _______________________________________________________________  For prescription refill requests, have your pharmacy contact our office. _______________________________________________________________  Recommendations made by the consultant and any test results will be sent to your referring physician. _______________________________________________________________ 

## 2018-02-10 ENCOUNTER — Ambulatory Visit (INDEPENDENT_AMBULATORY_CARE_PROVIDER_SITE_OTHER): Payer: 59

## 2018-02-10 ENCOUNTER — Ambulatory Visit (INDEPENDENT_AMBULATORY_CARE_PROVIDER_SITE_OTHER): Payer: 59 | Admitting: Family Medicine

## 2018-02-10 ENCOUNTER — Encounter: Payer: Self-pay | Admitting: Family Medicine

## 2018-02-10 VITALS — BP 164/78 | HR 76 | Resp 10 | Ht 64.0 in | Wt 220.0 lb

## 2018-02-10 VITALS — BP 170/82 | HR 83 | Resp 16 | Ht 64.0 in | Wt 222.0 lb

## 2018-02-10 DIAGNOSIS — Z0000 Encounter for general adult medical examination without abnormal findings: Secondary | ICD-10-CM

## 2018-02-10 DIAGNOSIS — I5032 Chronic diastolic (congestive) heart failure: Secondary | ICD-10-CM

## 2018-02-10 DIAGNOSIS — Z794 Long term (current) use of insulin: Secondary | ICD-10-CM

## 2018-02-10 DIAGNOSIS — J189 Pneumonia, unspecified organism: Secondary | ICD-10-CM | POA: Insufficient documentation

## 2018-02-10 DIAGNOSIS — Z09 Encounter for follow-up examination after completed treatment for conditions other than malignant neoplasm: Secondary | ICD-10-CM

## 2018-02-10 DIAGNOSIS — J181 Lobar pneumonia, unspecified organism: Secondary | ICD-10-CM

## 2018-02-10 DIAGNOSIS — N184 Chronic kidney disease, stage 4 (severe): Secondary | ICD-10-CM

## 2018-02-10 DIAGNOSIS — E1122 Type 2 diabetes mellitus with diabetic chronic kidney disease: Secondary | ICD-10-CM

## 2018-02-10 DIAGNOSIS — I10 Essential (primary) hypertension: Secondary | ICD-10-CM

## 2018-02-10 NOTE — Patient Instructions (Signed)
F/U as before, call if you need me sooner  CXR due in 4 weeks,eady ordered  Please complete the levaquin prescribed  Submit sputum to lab for culture, we will give  You a cup to collect a GOOD specimen tomorrow is fine

## 2018-02-10 NOTE — Progress Notes (Deleted)
Subjective:   Kathryn Beck is a 62 y.o. female who presents for Medicare Annual (Subsequent) preventive examination.  Review of Systems:  ***       Objective:     Vitals: BP (!) 170/82   Pulse 83   Resp 16   Ht 5\' 4"  (1.626 m)   Wt 222 lb (100.7 kg)   SpO2 96%   BMI 38.11 kg/m   Body mass index is 38.11 kg/m.  Advanced Directives 02/07/2018 02/06/2018 01/24/2018 01/17/2018 01/03/2018 12/20/2017 12/10/2017  Does Patient Have a Medical Advance Directive? No No No No No No No  Type of Advance Directive - - - - - - -  Does patient want to make changes to medical advance directive? - - - - - - -  Would patient like information on creating a medical advance directive? No - Patient declined No - Patient declined No - Patient declined No - Patient declined No - Patient declined No - Patient declined No - Patient declined  Pre-existing out of facility DNR order (yellow form or pink MOST form) - - - - - - -    Tobacco Social History   Tobacco Use  Smoking Status Never Smoker  Smokeless Tobacco Never Used     Counseling given: Not Answered   Clinical Intake:     Pain Score: 2                  Past Medical History:  Diagnosis Date  . Acid reflux   . Anemia   . Arthritis   . Axillary masses    Soft tissue - status post excision  . Back pain   . CKD (chronic kidney disease) stage 5, GFR less than 15 ml/min (HCC)   . Diastolic heart failure ([place])   . Essential hypertension   . Ischemic heart disease    Abnormal Myoview April 2018 - medical therapy  . Mixed hyperlipidemia   . Obesity   . Pancreatitis   . Sleep apnea    Noncompliant with CPAP  . Type 2 diabetes mellitus, uncontrolled ([place])    Past Surgical History:  Procedure Laterality Date  . ABDOMINAL HYSTERECTOMY    . AV FISTULA PLACEMENT Left 09/02/2017   Procedure: creation of left arm ARTERIOVENOUS (AV) FISTULA;  Surgeon: Serafina Mitchell, MD;  Location: [facility] OR;  Service: Vascular;  Laterality:  Left;  . COLONOSCOPY  2008   Dr. Oneida Alar: normal   . COLONOSCOPY N/A 12/18/2016   Dr. Oneida Alar: multiple tubular adenomas, internal hemorrhoids. Surveillance in 3 years   . ESOPHAGEAL DILATION N/A 10/13/2015   Procedure: ESOPHAGEAL DILATION;  Surgeon: Rogene Houston, MD;  Location: [facility];  Service: Endoscopy;  Laterality: N/A;  . ESOPHAGOGASTRODUODENOSCOPY N/A 10/13/2015   Dr. Laural Golden: chronic gastritis on path, no H.pylori. Empiric dilation   . ESOPHAGOGASTRODUODENOSCOPY N/A 12/18/2016   Dr. Oneida Alar: mild gastritis. BRAVO study revealed uncontrolled GERD. Dysphagia secondary to uncontrolled reflux  . FOOT SURGERY Bilateral   . LUNG BIOPSY    . MASS EXCISION Right 01/09/2013   Procedure: EXCISION OF NEOPLASM OF RIGHT  AXILLA  AND EXCISION OF NEOPLASM OF LEFT AXILLA;  Surgeon: Jamesetta So, MD;  Location: [facility] ORS;  Service: General;  Laterality: Right;  procedure end @ 08:23  . MYRINGOTOMY WITH TUBE PLACEMENT Bilateral 04/28/2017   Procedure: BILATERAL MYRINGOTOMY WITH TUBE PLACEMENT;  Surgeon: Leta Baptist, MD;  Location: [facility];  Service: ENT;  Laterality: Bilateral;  . SAVORY DILATION N/A  12/18/2016   Procedure: SAVORY DILATION;  Surgeon: Danie Binder, MD;  Location: [facility];  Service: Endoscopy;  Laterality: N/A;   Family History  Problem Relation Age of Onset  . Hypertension Father   . Hypercholesterolemia Father   . Arthritis Father   . Hypertension Sister   . Hypercholesterolemia Sister   . Breast cancer Sister   . Hypertension Sister   . Colon cancer Neg Hx   . Colon polyps Neg Hx    Social History   Socioeconomic History  . Marital status: Married    Spouse name: Not on file  . Number of children: Not on file  . Years of education: Not on file  . Highest education level: Not on file  Occupational History  . Not on file  Social Needs  . Financial resource strain: Not on file  . Food insecurity:    Worry: Not on file    Inability: Not on file  . Transportation  needs:    Medical: Not on file    Non-medical: Not on file  Tobacco Use  . Smoking status: Never Smoker  . Smokeless tobacco: Never Used  Substance and Sexual Activity  . Alcohol use: No  . Drug use: No  . Sexual activity: Not Currently  Lifestyle  . Physical activity:    Days per week: Not on file    Minutes per session: Not on file  . Stress: Not on file  Relationships  . Social connections:    Talks on phone: Not on file    Gets together: Not on file    Attends religious service: Not on file    Active member of club or organization: Not on file    Attends meetings of clubs or organizations: Not on file    Relationship status: Not on file  Other Topics Concern  . Not on file  Social History Narrative  . Not on file    Outpatient Encounter Medications as of 02/10/2018  Medication Sig  . acetaminophen (TYLENOL) 500 MG tablet Take by mouth.  . ALPRAZolam (XANAX) 0.25 MG tablet Take 1 tablet (0.25 mg total) by mouth at bedtime as needed for anxiety.  Marland Kitchen amLODipine (NORVASC) 10 MG tablet Take 10 mg by mouth daily.   Marland Kitchen aspirin EC 81 MG tablet Take 1 tablet (81 mg total) by mouth daily.  Marland Kitchen azelastine (ASTELIN) 0.1 % nasal spray Place 2 sprays into both nostrils 2 (two) times daily. Use in each nostril as directed  . azithromycin (ZITHROMAX) 250 MG tablet Two tablets on day one then one tablet once daily for an additional four days  . benzonatate (TESSALON) 100 MG capsule Take 1 capsule (100 mg total) by mouth 2 (two) times daily as needed for cough.  . Calcifediol ER 30 MCG CPCR Take 1 capsule by mouth daily.   . calcitRIOL (ROCALTROL) 0.25 MCG capsule Take 0.25 mcg by mouth every Monday, Wednesday, and Friday.   . Calcium Carbonate Antacid (CALCIUM CARBONATE, DOSED IN MG ELEMENTAL CALCIUM,) 1250 MG/5ML SUSP Take 5 mLs (500 mg of elemental calcium total) by mouth every 6 (six) hours as needed for indigestion.  . camphor-menthol (SARNA) lotion Apply 1 application topically every 8  (eight) hours as needed for itching.  . cloNIDine (CATAPRES) 0.3 MG tablet Take 1 tablet (0.3 mg total) by mouth 3 (three) times daily.  Marland Kitchen dexlansoprazole (DEXILANT) 60 MG capsule TAKE 1 CAPSULE IN THE MORNING WITH FOOD  . ezetimibe (ZETIA) 10 MG tablet Take  1 tablet (10 mg total) by mouth daily.  Marland Kitchen gabapentin (NEURONTIN) 100 MG capsule TAKE (1) CAPSULE BY MOUTH TWICE DAILY.  . hydrochlorothiazide (MICROZIDE) 12.5 MG capsule TAKE (1) CAPSULE BY MOUTH ONCE DAILY.  Marland Kitchen Insulin Detemir (LEVEMIR FLEXTOUCH) 100 UNIT/ML Pen INJECT 50 UNITS INTO THE SKIN TWICE DAILY  . insulin lispro (HUMALOG KWIKPEN) 100 UNIT/ML KiwkPen INJECT 36-40 units 3x a day before meals  . isosorbide mononitrate (IMDUR) 30 MG 24 hr tablet Take 1 tablet (30 mg total) by mouth daily.  Marland Kitchen levofloxacin (LEVAQUIN) 250 MG tablet Take 1 tablet (250 mg total) by mouth daily.  Marland Kitchen olmesartan (BENICAR) 40 MG tablet Take 40 mg by mouth daily.  Marland Kitchen senna-docusate (SENOKOT-S) 8.6-50 MG tablet Take 2 tablets by mouth at bedtime as needed for mild constipation or moderate constipation.  . sertraline (ZOLOFT) 25 MG tablet TAKE 1 TABLET BY MOUTH ONCE DAILY.  . sevelamer carbonate (RENVELA) 800 MG tablet Take 800 mg by mouth 3 (three) times daily with meals.   . torsemide (DEMADEX) 20 MG tablet TAKE (2) TABLETS BY MOUTH ONCE DAILY AS NEEDED (EDEMA OR WEIGHT GAIN;3 POUNDS IN A DAY OR 5 POUNDS IN A WEEK).  . traMADol (ULTRAM) 50 MG tablet One tablet at bedtime for neck and arm pain  . ULTICARE MINI PEN NEEDLES 31G X 6 MM MISC USE AS DIRECTED  . Vitamin D, Ergocalciferol, (DRISDOL) 50000 units CAPS capsule TAKE 1 CAPSULE BY MOUTH ONCE A WEEK  . [DISCONTINUED] FLUoxetine (PROZAC) 10 MG capsule Take 10 mg by mouth daily.    . [DISCONTINUED] glipiZIDE (GLUCOTROL) 10 MG tablet Take 10 mg by mouth 2 (two) times daily before a meal.     No facility-administered encounter medications on file as of 02/10/2018.     Activities of Daily Living In your present  state of health, do you have any difficulty performing the following activities: 09/19/2017  Hearing? N  Vision? N  Difficulty concentrating or making decisions? N  Walking or climbing stairs? Y  Dressing or bathing? Y  Doing errands, shopping? Y  Some recent data might be hidden    Patient Care Team: Fayrene Helper, MD as PCP - General Domenic Polite Aloha Gell, MD as PCP - Cardiology (Cardiology) Gala Romney Cristopher Estimable, MD as Consulting Physician (Gastroenterology) Fran Lowes, MD as Consulting Physician (Nephrology)    Assessment:   This is a routine wellness examination for Willie.  Exercise Activities and Dietary recommendations    Goals   None     Fall Risk Fall Risk  02/01/2018 01/10/2018 12/20/2017 11/09/2017 07/08/2017  Falls in the past year? 1 No No Yes Yes  Number falls in past yr: 0 - - 2 or more 2 or more  Injury with Fall? 0 - - - -  Follow up - - - - -   Is the patient's home free of loose throw rugs in walkways, pet beds, electrical cords, etc?   {Blank single:19197::"yes","no"}      Grab bars in the bathroom? {Blank single:19197::"yes","no"}      Handrails on the stairs?   {Blank single:19197::"yes","no"}      Adequate lighting?   {Blank single:19197::"yes","no"}  Timed Get Up and Go performed: ***  Depression Screen PHQ 2/9 Scores 02/10/2018 02/01/2018 01/10/2018 12/20/2017  PHQ - 2 Score 0 0 0 1  PHQ- 9 Score 3 3 3 4      Cognitive Function        Immunization History  Administered Date(s) Administered  . H1N1 01/16/2008  .  Influenza Whole 12/28/2004, 02/24/2006, 12/07/2006, 02/01/2008, 01/28/2009, 11/25/2010  . Influenza,inj,Quad PF,6+ Mos 12/22/2012, 03/12/2015, 01/14/2016, 11/24/2016, 11/09/2017  . Pneumococcal Polysaccharide-23 01/28/2009, 07/25/2015  . Td 11/12/2003  . Tdap 10/02/2014  . Zoster Recombinat (Shingrix) 12/24/2016    Qualifies for Shingles Vaccine?***  Screening Tests Health Maintenance  Topic Date Due  . OPHTHALMOLOGY  EXAM  01/12/2017  . PAP SMEAR  01/28/2023 (Originally 09/18/2016)  . HEMOGLOBIN A1C  05/15/2018  . MAMMOGRAM  12/17/2018  . FOOT EXAM  12/21/2018  . TETANUS/TDAP  10/01/2024  . COLONOSCOPY  12/19/2026  . INFLUENZA VACCINE  Completed  . PNEUMOCOCCAL POLYSACCHARIDE VACCINE AGE 72-64 HIGH RISK  Completed  . Hepatitis C Screening  Completed  . HIV Screening  Completed    Cancer Screenings: Lung: Low Dose CT Chest recommended if Age 12-80 years, 30 pack-year currently smoking OR have quit w/in 15years. Patient {DOES NOT does:27190::"does not"} qualify. Breast:  Up to date on Mammogram? {Yes/No:30480221}   Up to date of Bone Density/Dexa? {Yes/No:30480221} Colorectal: ***  Additional Screenings: ***: Hepatitis C Screening:      Plan:   ***   I have personally reviewed and noted the following in the patient's chart:   . Medical and social history . Use of alcohol, tobacco or illicit drugs  . Current medications and supplements . Functional ability and status . Nutritional status . Physical activity . Advanced directives . List of other physicians . Hospitalizations, surgeries, and ER visits in previous 12 months . Vitals . Screenings to include cognitive, depression, and falls . Referrals and appointments  In addition, I have reviewed and discussed with patient certain preventive protocols, quality metrics, and best practice recommendations. A written personalized care plan for preventive services as well as general preventive health recommendations were provided to patient.     Hayden Pedro, LPN  [id number]

## 2018-02-10 NOTE — Progress Notes (Signed)
Subjective:   Kathryn Beck is a 62 y.o. female who presents for Medicare Annual (Subsequent) preventive examination.  Review of Systems:  Cardiac Risk Factors include: hypertension;diabetes mellitus;obesity (BMI >30kg/m2);dyslipidemia;sedentary lifestyle     Objective:     Vitals: BP (!) 164/78   Pulse 76   Resp 10   Ht 5\' 4"  (1.626 m)   Wt 220 lb (99.8 kg)   SpO2 100%   BMI 37.76 kg/m   Body mass index is 37.76 kg/m.  Advanced Directives 02/10/2018 02/07/2018 02/06/2018 01/24/2018 01/17/2018 01/03/2018 12/20/2017  Does Patient Have a Medical Advance Directive? No No No No No No No  Type of Advance Directive - - - - - - -  Does patient want to make changes to medical advance directive? - - - - - - -  Would patient like information on creating a medical advance directive? No - Patient declined No - Patient declined No - Patient declined No - Patient declined No - Patient declined No - Patient declined No - Patient declined  Pre-existing out of facility DNR order (yellow form or pink MOST form) - - - - - - -    Tobacco Social History   Tobacco Use  Smoking Status Never Smoker  Smokeless Tobacco Never Used     Counseling given: Not Answered   Clinical Intake:  Pre-visit preparation completed: Yes  Pain : 0-10 Pain Score: 4  Pain Type: Chronic pain Pain Location: Back Pain Orientation: Left Pain Descriptors / Indicators: Aching Pain Onset: More than a month ago Pain Frequency: Constant Pain Relieving Factors: tylenol   Pain Relieving Factors: tylenol   BMI - recorded: 38.1 Nutritional Status: BMI > 30  Obese Nutritional Risks: None Diabetes: Yes CBG done?: No Did pt. bring in CBG monitor from home?: No  How often do you need to have someone help you when you read instructions, pamphlets, or other written materials from your doctor or pharmacy?: 2 - Rarely What is the last grade level you completed in school?: 12 grade   Interpreter Needed?:  No  Information entered by :: Francena Hanly LPN   Past Medical History:  Diagnosis Date  . Acid reflux   . Anemia   . Arthritis   . Axillary masses    Soft tissue - status post excision  . Back pain   . CKD (chronic kidney disease) stage 5, GFR less than 15 ml/min (HCC)   . Diastolic heart failure ([place])   . Essential hypertension   . Ischemic heart disease    Abnormal Myoview April 2018 - medical therapy  . Mixed hyperlipidemia   . Obesity   . Pancreatitis   . Sleep apnea    Noncompliant with CPAP  . Type 2 diabetes mellitus, uncontrolled ([place])    Past Surgical History:  Procedure Laterality Date  . ABDOMINAL HYSTERECTOMY    . AV FISTULA PLACEMENT Left 09/02/2017   Procedure: creation of left arm ARTERIOVENOUS (AV) FISTULA;  Surgeon: Serafina Mitchell, MD;  Location: [facility] OR;  Service: Vascular;  Laterality: Left;  . COLONOSCOPY  2008   Dr. Oneida Alar: normal   . COLONOSCOPY N/A 12/18/2016   Dr. Oneida Alar: multiple tubular adenomas, internal hemorrhoids. Surveillance in 3 years   . ESOPHAGEAL DILATION N/A 10/13/2015   Procedure: ESOPHAGEAL DILATION;  Surgeon: Rogene Houston, MD;  [facility];  Service: Endoscopy;  Laterality: N/A;  . ESOPHAGOGASTRODUODENOSCOPY N/A 10/13/2015   Dr. Laural Golden: chronic gastritis on path, no H.pylori. Empiric dilation   .  ESOPHAGOGASTRODUODENOSCOPY N/A 12/18/2016   Dr. Oneida Alar: mild gastritis. BRAVO study revealed uncontrolled GERD. Dysphagia secondary to uncontrolled reflux  . FOOT SURGERY Bilateral   . LUNG BIOPSY    . MASS EXCISION Right 01/09/2013   Procedure: EXCISION OF NEOPLASM OF RIGHT  AXILLA  AND EXCISION OF NEOPLASM OF LEFT AXILLA;  Surgeon: Jamesetta So, MD;  Location: [facility];  Service: General;  Laterality: Right;  procedure end @ 08:23  . MYRINGOTOMY WITH TUBE PLACEMENT Bilateral 04/28/2017   Procedure: BILATERAL MYRINGOTOMY WITH TUBE PLACEMENT;  Surgeon: Leta Baptist, MD;  Location: [facility];  Service: ENT;  Laterality: Bilateral;  . SAVORY  DILATION N/A 12/18/2016   Procedure: SAVORY DILATION;  Surgeon: Danie Binder, MD;  [facility];  Service: Endoscopy;  Laterality: N/A;   Family History  Problem Relation Age of Onset  . Hypertension Father   . Hypercholesterolemia Father   . Arthritis Father   . Hypertension Sister   . Hypercholesterolemia Sister   . Breast cancer Sister   . Hypertension Sister   . Colon cancer Neg Hx   . Colon polyps Neg Hx    Social History   Socioeconomic History  . Marital status: Married    Spouse name: larry   . Number of children: 2  . Years of education: 3  . Highest education level: 12th grade  Occupational History  . Occupation: retired   Scientific laboratory technician  . Financial resource strain: Somewhat hard  . Food insecurity:    Worry: Never true    Inability: Never true  . Transportation needs:    Medical: No    Non-medical: No  Tobacco Use  . Smoking status: Never Smoker  . Smokeless tobacco: Never Used  Substance and Sexual Activity  . Alcohol use: No  . Drug use: No  . Sexual activity: Not Currently  Lifestyle  . Physical activity:    Days per week: 0 days    Minutes per session: 0 min  . Stress: Only a little  Relationships  . Social connections:    Talks on phone: More than three times a week    Gets together: More than three times a week    Attends religious service: Never    Active member of club or organization: No    Attends meetings of clubs or organizations: Never    Relationship status: Married  Other Topics Concern  . Not on file  Social History Narrative   Lives alone with husband     Outpatient Encounter Medications as of 02/10/2018  Medication Sig  . acetaminophen (TYLENOL) 500 MG tablet Take by mouth.  . ALPRAZolam (XANAX) 0.25 MG tablet Take 1 tablet (0.25 mg total) by mouth at bedtime as needed for anxiety.  Marland Kitchen amLODipine (NORVASC) 10 MG tablet Take 10 mg by mouth daily.   Marland Kitchen aspirin EC 81 MG tablet Take 1 tablet (81 mg total) by mouth  daily.  Marland Kitchen azelastine (ASTELIN) 0.1 % nasal spray Place 2 sprays into both nostrils 2 (two) times daily. Use in each nostril as directed  . azithromycin (ZITHROMAX) 250 MG tablet Two tablets on day one then one tablet once daily for an additional four days  . benzonatate (TESSALON) 100 MG capsule Take 1 capsule (100 mg total) by mouth 2 (two) times daily as needed for cough.  . Calcifediol ER 30 MCG CPCR Take 1 capsule by mouth daily.   . calcitRIOL (ROCALTROL) 0.25 MCG capsule Take 0.25 mcg by mouth every Monday,  Wednesday, and Friday.   . Calcium Carbonate Antacid (CALCIUM CARBONATE, DOSED IN MG ELEMENTAL CALCIUM,) 1250 MG/5ML SUSP Take 5 mLs (500 mg of elemental calcium total) by mouth every 6 (six) hours as needed for indigestion.  . camphor-menthol (SARNA) lotion Apply 1 application topically every 8 (eight) hours as needed for itching.  . cloNIDine (CATAPRES) 0.3 MG tablet Take 1 tablet (0.3 mg total) by mouth 3 (three) times daily.  Marland Kitchen dexlansoprazole (DEXILANT) 60 MG capsule TAKE 1 CAPSULE IN THE MORNING WITH FOOD  . ezetimibe (ZETIA) 10 MG tablet Take 1 tablet (10 mg total) by mouth daily.  Marland Kitchen gabapentin (NEURONTIN) 100 MG capsule TAKE (1) CAPSULE BY MOUTH TWICE DAILY.  . hydrochlorothiazide (MICROZIDE) 12.5 MG capsule TAKE (1) CAPSULE BY MOUTH ONCE DAILY.  Marland Kitchen Insulin Detemir (LEVEMIR FLEXTOUCH) 100 UNIT/ML Pen INJECT 50 UNITS INTO THE SKIN TWICE DAILY  . insulin lispro (HUMALOG KWIKPEN) 100 UNIT/ML KiwkPen INJECT 36-40 units 3x a day before meals  . isosorbide mononitrate (IMDUR) 30 MG 24 hr tablet Take 1 tablet (30 mg total) by mouth daily.  Marland Kitchen levofloxacin (LEVAQUIN) 250 MG tablet Take 1 tablet (250 mg total) by mouth daily.  Marland Kitchen olmesartan (BENICAR) 40 MG tablet Take 40 mg by mouth daily.  Marland Kitchen senna-docusate (SENOKOT-S) 8.6-50 MG tablet Take 2 tablets by mouth at bedtime as needed for mild constipation or moderate constipation.  . sertraline (ZOLOFT) 25 MG tablet TAKE 1 TABLET BY MOUTH ONCE  DAILY.  . sevelamer carbonate (RENVELA) 800 MG tablet Take 800 mg by mouth 3 (three) times daily with meals.   . torsemide (DEMADEX) 20 MG tablet TAKE (2) TABLETS BY MOUTH ONCE DAILY AS NEEDED (EDEMA OR WEIGHT GAIN;3 POUNDS IN A DAY OR 5 POUNDS IN A WEEK).  . traMADol (ULTRAM) 50 MG tablet One tablet at bedtime for neck and arm pain  . ULTICARE MINI PEN NEEDLES 31G X 6 MM MISC USE AS DIRECTED  . Vitamin D, Ergocalciferol, (DRISDOL) 50000 units CAPS capsule TAKE 1 CAPSULE BY MOUTH ONCE A WEEK  . [DISCONTINUED] FLUoxetine (PROZAC) 10 MG capsule Take 10 mg by mouth daily.    . [DISCONTINUED] glipiZIDE (GLUCOTROL) 10 MG tablet Take 10 mg by mouth 2 (two) times daily before a meal.     No facility-administered encounter medications on file as of 02/10/2018.     Activities of Daily Living In your present state of health, do you have any difficulty performing the following activities: 02/10/2018 09/19/2017  Hearing? Y N  Vision? N N  Difficulty concentrating or making decisions? Y N  Walking or climbing stairs? Y Y  Dressing or bathing? N Y  Doing errands, shopping? N Y  Conservation officer, nature and eating ? Y -  Using the Toilet? N -  In the past six months, have you accidently leaked urine? Y -  Do you have problems with loss of bowel control? N -  Managing your Medications? N -  Managing your Finances? N -  Housekeeping or managing your Housekeeping? N -  Some recent data might be hidden    Patient Care Team: Fayrene Helper, MD as PCP - General Domenic Polite Aloha Gell, MD as PCP - Cardiology (Cardiology) Gala Romney Cristopher Estimable, MD as Consulting Physician (Gastroenterology) Fran Lowes, MD as Consulting Physician (Nephrology)    Assessment:   This is a routine wellness examination for Taziah.  Exercise Activities and Dietary recommendations Current Exercise Habits: The patient does not participate in regular exercise at present, Exercise limited by: orthopedic  condition(s);cardiac  condition(s)  Goals    . DIET - REDUCE SUGAR INTAKE    . Increase physical activity       Fall Risk Fall Risk  02/10/2018 02/01/2018 01/10/2018 12/20/2017 11/09/2017  Falls in the past year? 1 1 No No Yes  Number falls in past yr: 1 0 - - 2 or more  Injury with Fall? 0 0 - - -  Risk for fall due to : History of fall(s);Medication side effect;Impaired balance/gait;Impaired mobility - - - -  Follow up - - - - -   Is the patient's home free of loose throw rugs in walkways, pet beds, electrical cords, etc?   yes      Grab bars in the bathroom? yes      Handrails on the stairs?   yes      Adequate lighting?   yes  Timed Get Up and Go performed: patient able to perform in 5 seconds without assistance   Depression Screen PHQ 2/9 Scores 02/10/2018 02/10/2018 02/01/2018 01/10/2018  PHQ - 2 Score 2 0 0 0  PHQ- 9 Score 3 3 3 3      Cognitive Function     6CIT Screen 02/10/2018  What Year? 0 points  What month? 0 points  What time? 0 points  Count back from 20 0 points  Months in reverse 2 points  Repeat phrase 2 points  Total Score 4    Immunization History  Administered Date(s) Administered  . H1N1 01/16/2008  . Influenza Whole 12/28/2004, 02/24/2006, 12/07/2006, 02/01/2008, 01/28/2009, 11/25/2010  . Influenza,inj,Quad PF,6+ Mos 12/22/2012, 03/12/2015, 01/14/2016, 11/24/2016, 11/09/2017  . Pneumococcal Polysaccharide-23 01/28/2009, 07/25/2015  . Td 11/12/2003  . Tdap 10/02/2014  . Zoster Recombinat (Shingrix) 12/24/2016    Qualifies for Shingles Vaccine?Up to date   Screening Tests Health Maintenance  Topic Date Due  . OPHTHALMOLOGY EXAM  01/12/2017  . PAP SMEAR  01/28/2023 (Originally 09/18/2016)  . HEMOGLOBIN A1C  05/15/2018  . MAMMOGRAM  12/17/2018  . FOOT EXAM  12/21/2018  . TETANUS/TDAP  10/01/2024  . COLONOSCOPY  12/19/2026  . INFLUENZA VACCINE  Completed  . PNEUMOCOCCAL POLYSACCHARIDE VACCINE AGE 48-64 HIGH RISK  Completed  . Hepatitis C Screening  Completed   . HIV Screening  Completed    Cancer Screenings: Lung: Low Dose CT Chest recommended if Age 84-80 years, 30 pack-year currently smoking OR have quit w/in 15years. Patient does not qualify. Breast:  Up to date on Mammogram? Yes   Up to date of Bone Density/Dexa? Yes Colorectal: up to date   Additional Screenings:  Hepatitis C Screening: complete      Plan:   Continue to increase physical activity   I have personally reviewed and noted the following in the patient's chart:   . Medical and social history . Use of alcohol, tobacco or illicit drugs  . Current medications and supplements . Functional ability and status . Nutritional status . Physical activity . Advanced directives . List of other physicians . Hospitalizations, surgeries, and ER visits in previous 12 months . Vitals . Screenings to include cognitive, depression, and falls . Referrals and appointments  In addition, I have reviewed and discussed with patient certain preventive protocols, quality metrics, and best practice recommendations. A written personalized care plan for preventive services as well as general preventive health recommendations were provided to patient.     Hayden Pedro, LPN  [id number]/05/[id number]       Subjective:   Kathryn Beck is a 62  y.o. female who presents for Medicare Annual (Subsequent) preventive examination.  Review of Systems:   Cardiac Risk Factors include: hypertension;diabetes mellitus;obesity (BMI >30kg/m2);dyslipidemia;sedentary lifestyle     Objective:     Vitals: BP (!) 164/78   Pulse 76   Resp 10   Ht 5\' 4"  (1.626 m)   Wt 220 lb (99.8 kg)   SpO2 100%   BMI 37.76 kg/m   Body mass index is 37.76 kg/m.  Advanced Directives 02/10/2018 02/07/2018 02/06/2018 01/24/2018 01/17/2018 01/03/2018 12/20/2017  Does Patient Have a Medical Advance Directive? No No No No No No No  Type of Advance Directive - - - - - - -  Does patient want to make changes to medical advance  directive? - - - - - - -  Would patient like information on creating a medical advance directive? No - Patient declined No - Patient declined No - Patient declined No - Patient declined No - Patient declined No - Patient declined No - Patient declined  Pre-existing out of facility DNR order (yellow form or pink MOST form) - - - - - - -    Tobacco Social History   Tobacco Use  Smoking Status Never Smoker  Smokeless Tobacco Never Used     Counseling given: Not Answered   Clinical Intake:  Pre-visit preparation completed: Yes  Pain : 0-10 Pain Score: 4  Pain Type: Chronic pain Pain Location: Back Pain Orientation: Left Pain Descriptors / Indicators: Aching Pain Onset: More than a month ago Pain Frequency: Constant Pain Relieving Factors: tylenol   Pain Relieving Factors: tylenol   BMI - recorded: 38.1 Nutritional Status: BMI > 30  Obese Nutritional Risks: None Diabetes: Yes CBG done?: No Did pt. bring in CBG monitor from home?: No  How often do you need to have someone help you when you read instructions, pamphlets, or other written materials from your doctor or pharmacy?: 2 - Rarely What is the last grade level you completed in school?: 12 grade   Interpreter Needed?: No  Information entered by :: Francena Hanly LPN   Past Medical History:  Diagnosis Date  . Acid reflux   . Anemia   . Arthritis   . Axillary masses    Soft tissue - status post excision  . Back pain   . CKD (chronic kidney disease) stage 5, GFR less than 15 ml/min (HCC)   . Diastolic heart failure ([place])   . Essential hypertension   . Ischemic heart disease    Abnormal Myoview April 2018 - medical therapy  . Mixed hyperlipidemia   . Obesity   . Pancreatitis   . Sleep apnea    Noncompliant with CPAP  . Type 2 diabetes mellitus, uncontrolled ([place])    Past Surgical History:  Procedure Laterality Date  . ABDOMINAL HYSTERECTOMY    . AV FISTULA PLACEMENT Left 09/02/2017   Procedure: creation of  left arm ARTERIOVENOUS (AV) FISTULA;  Surgeon: Serafina Mitchell, MD;  Location: [facility] OR;  Service: Vascular;  Laterality: Left;  . COLONOSCOPY  2008   Dr. Oneida Alar: normal   . COLONOSCOPY N/A 12/18/2016   Dr. Oneida Alar: multiple tubular adenomas, internal hemorrhoids. Surveillance in 3 years   . ESOPHAGEAL DILATION N/A 10/13/2015   Procedure: ESOPHAGEAL DILATION;  Surgeon: Rogene Houston, MD;  [facility];  Service: Endoscopy;  Laterality: N/A;  . ESOPHAGOGASTRODUODENOSCOPY N/A 10/13/2015   Dr. Laural Golden: chronic gastritis on path, no H.pylori. Empiric dilation   . ESOPHAGOGASTRODUODENOSCOPY N/A 12/18/2016  Dr. Oneida Alar: mild gastritis. BRAVO study revealed uncontrolled GERD. Dysphagia secondary to uncontrolled reflux  . FOOT SURGERY Bilateral   . LUNG BIOPSY    . MASS EXCISION Right 01/09/2013   Procedure: EXCISION OF NEOPLASM OF RIGHT  AXILLA  AND EXCISION OF NEOPLASM OF LEFT AXILLA;  Surgeon: Jamesetta So, MD;  Location: [facility];  Service: General;  Laterality: Right;  procedure end @ 08:23  . MYRINGOTOMY WITH TUBE PLACEMENT Bilateral 04/28/2017   Procedure: BILATERAL MYRINGOTOMY WITH TUBE PLACEMENT;  Surgeon: Leta Baptist, MD;  Location: [facility];  Service: ENT;  Laterality: Bilateral;  . SAVORY DILATION N/A 12/18/2016   Procedure: SAVORY DILATION;  Surgeon: Danie Binder, MD;  [facility];  Service: Endoscopy;  Laterality: N/A;   Family History  Problem Relation Age of Onset  . Hypertension Father   . Hypercholesterolemia Father   . Arthritis Father   . Hypertension Sister   . Hypercholesterolemia Sister   . Breast cancer Sister   . Hypertension Sister   . Colon cancer Neg Hx   . Colon polyps Neg Hx    Social History   Socioeconomic History  . Marital status: Married    Spouse name: larry   . Number of children: 2  . Years of education: 75  . Highest education level: 12th grade  Occupational History  . Occupation: retired   Scientific laboratory technician  . Financial resource strain:  Somewhat hard  . Food insecurity:    Worry: Never true    Inability: Never true  . Transportation needs:    Medical: No    Non-medical: No  Tobacco Use  . Smoking status: Never Smoker  . Smokeless tobacco: Never Used  Substance and Sexual Activity  . Alcohol use: No  . Drug use: No  . Sexual activity: Not Currently  Lifestyle  . Physical activity:    Days per week: 0 days    Minutes per session: 0 min  . Stress: Only a little  Relationships  . Social connections:    Talks on phone: More than three times a week    Gets together: More than three times a week    Attends religious service: Never    Active member of club or organization: No    Attends meetings of clubs or organizations: Never    Relationship status: Married  Other Topics Concern  . Not on file  Social History Narrative   Lives alone with husband     Outpatient Encounter Medications as of 02/10/2018  Medication Sig  . acetaminophen (TYLENOL) 500 MG tablet Take by mouth.  . ALPRAZolam (XANAX) 0.25 MG tablet Take 1 tablet (0.25 mg total) by mouth at bedtime as needed for anxiety.  Marland Kitchen amLODipine (NORVASC) 10 MG tablet Take 10 mg by mouth daily.   Marland Kitchen aspirin EC 81 MG tablet Take 1 tablet (81 mg total) by mouth daily.  Marland Kitchen azelastine (ASTELIN) 0.1 % nasal spray Place 2 sprays into both nostrils 2 (two) times daily. Use in each nostril as directed  . azithromycin (ZITHROMAX) 250 MG tablet Two tablets on day one then one tablet once daily for an additional four days  . benzonatate (TESSALON) 100 MG capsule Take 1 capsule (100 mg total) by mouth 2 (two) times daily as needed for cough.  . Calcifediol ER 30 MCG CPCR Take 1 capsule by mouth daily.   . calcitRIOL (ROCALTROL) 0.25 MCG capsule Take 0.25 mcg by mouth every Monday, Wednesday, and Friday.   Marland Kitchen  Calcium Carbonate Antacid (CALCIUM CARBONATE, DOSED IN MG ELEMENTAL CALCIUM,) 1250 MG/5ML SUSP Take 5 mLs (500 mg of elemental calcium total) by mouth every 6 (six) hours as  needed for indigestion.  . camphor-menthol (SARNA) lotion Apply 1 application topically every 8 (eight) hours as needed for itching.  . cloNIDine (CATAPRES) 0.3 MG tablet Take 1 tablet (0.3 mg total) by mouth 3 (three) times daily.  Marland Kitchen dexlansoprazole (DEXILANT) 60 MG capsule TAKE 1 CAPSULE IN THE MORNING WITH FOOD  . ezetimibe (ZETIA) 10 MG tablet Take 1 tablet (10 mg total) by mouth daily.  Marland Kitchen gabapentin (NEURONTIN) 100 MG capsule TAKE (1) CAPSULE BY MOUTH TWICE DAILY.  . hydrochlorothiazide (MICROZIDE) 12.5 MG capsule TAKE (1) CAPSULE BY MOUTH ONCE DAILY.  Marland Kitchen Insulin Detemir (LEVEMIR FLEXTOUCH) 100 UNIT/ML Pen INJECT 50 UNITS INTO THE SKIN TWICE DAILY  . insulin lispro (HUMALOG KWIKPEN) 100 UNIT/ML KiwkPen INJECT 36-40 units 3x a day before meals  . isosorbide mononitrate (IMDUR) 30 MG 24 hr tablet Take 1 tablet (30 mg total) by mouth daily.  Marland Kitchen levofloxacin (LEVAQUIN) 250 MG tablet Take 1 tablet (250 mg total) by mouth daily.  Marland Kitchen olmesartan (BENICAR) 40 MG tablet Take 40 mg by mouth daily.  Marland Kitchen senna-docusate (SENOKOT-S) 8.6-50 MG tablet Take 2 tablets by mouth at bedtime as needed for mild constipation or moderate constipation.  . sertraline (ZOLOFT) 25 MG tablet TAKE 1 TABLET BY MOUTH ONCE DAILY.  . sevelamer carbonate (RENVELA) 800 MG tablet Take 800 mg by mouth 3 (three) times daily with meals.   . torsemide (DEMADEX) 20 MG tablet TAKE (2) TABLETS BY MOUTH ONCE DAILY AS NEEDED (EDEMA OR WEIGHT GAIN;3 POUNDS IN A DAY OR 5 POUNDS IN A WEEK).  . traMADol (ULTRAM) 50 MG tablet One tablet at bedtime for neck and arm pain  . ULTICARE MINI PEN NEEDLES 31G X 6 MM MISC USE AS DIRECTED  . Vitamin D, Ergocalciferol, (DRISDOL) 50000 units CAPS capsule TAKE 1 CAPSULE BY MOUTH ONCE A WEEK  . [DISCONTINUED] FLUoxetine (PROZAC) 10 MG capsule Take 10 mg by mouth daily.    . [DISCONTINUED] glipiZIDE (GLUCOTROL) 10 MG tablet Take 10 mg by mouth 2 (two) times daily before a meal.     No facility-administered  encounter medications on file as of 02/10/2018.     Activities of Daily Living In your present state of health, do you have any difficulty performing the following activities: 02/10/2018 09/19/2017  Hearing? Y N  Vision? N N  Difficulty concentrating or making decisions? Y N  Walking or climbing stairs? Y Y  Dressing or bathing? N Y  Doing errands, shopping? N Y  Conservation officer, nature and eating ? Y -  Using the Toilet? N -  In the past six months, have you accidently leaked urine? Y -  Do you have problems with loss of bowel control? N -  Managing your Medications? N -  Managing your Finances? N -  Housekeeping or managing your Housekeeping? N -  Some recent data might be hidden    Patient Care Team: Fayrene Helper, MD as PCP - General Domenic Polite Aloha Gell, MD as PCP - Cardiology (Cardiology) Gala Romney Cristopher Estimable, MD as Consulting Physician (Gastroenterology) Fran Lowes, MD as Consulting Physician (Nephrology)    Assessment:   This is a routine wellness examination for Kathryn Beck.  Exercise Activities and Dietary recommendations Current Exercise Habits: The patient does not participate in regular exercise at present, Exercise limited by: orthopedic condition(s);cardiac condition(s)  Goals    .  DIET - REDUCE SUGAR INTAKE    . Increase physical activity       Fall Risk Fall Risk  02/10/2018 02/01/2018 01/10/2018 12/20/2017 11/09/2017  Falls in the past year? 1 1 No No Yes  Number falls in past yr: 1 0 - - 2 or more  Injury with Fall? 0 0 - - -  Risk for fall due to : History of fall(s);Medication side effect;Impaired balance/gait;Impaired mobility - - - -  Follow up - - - - -   Is the patient's home free of loose throw rugs in walkways, pet beds, electrical cords, etc?   no      Grab bars in the bathroom? yes      Handrails on the stairs?   yes      Adequate lighting?   yes  Timed Get Up and Go performed: Patient able to perform   Depression Screen PHQ 2/9 Scores  02/10/2018 02/10/2018 02/01/2018 01/10/2018  PHQ - 2 Score 2 0 0 0  PHQ- 9 Score 3 3 3 3      Cognitive Function     6CIT Screen 02/10/2018  What Year? 0 points  What month? 0 points  What time? 0 points  Count back from 20 0 points  Months in reverse 2 points  Repeat phrase 2 points  Total Score 4    Immunization History  Administered Date(s) Administered  . H1N1 01/16/2008  . Influenza Whole 12/28/2004, 02/24/2006, 12/07/2006, 02/01/2008, 01/28/2009, 11/25/2010  . Influenza,inj,Quad PF,6+ Mos 12/22/2012, 03/12/2015, 01/14/2016, 11/24/2016, 11/09/2017  . Pneumococcal Polysaccharide-23 01/28/2009, 07/25/2015  . Td 11/12/2003  . Tdap 10/02/2014  . Zoster Recombinat ([facility]) 12/24/2016    Qualifies for Shingles Vaccine?  Screening Tests Health Maintenance  Topic Date Due  . OPHTHALMOLOGY EXAM  01/12/2017  . PAP SMEAR  01/28/2023 (Originally 09/18/2016)  . HEMOGLOBIN A1C  05/15/2018  . MAMMOGRAM  12/17/2018  . FOOT EXAM  12/21/2018  . TETANUS/TDAP  10/01/2024  . COLONOSCOPY  12/19/2026  . INFLUENZA VACCINE  Completed  . PNEUMOCOCCAL POLYSACCHARIDE VACCINE AGE 43-64 HIGH RISK  Completed  . Hepatitis C Screening  Completed  . HIV Screening  Completed    Cancer Screenings: Lung: Low Dose CT Chest recommended if Age 58-80 years, 30 pack-year currently smoking OR have quit w/in 15years. Patient does not qualify. Breast:  Up to date on Mammogram? Yes   Up to date of Bone Density/Dexa? Yes Colorectal:   Additional Screenings: : Hepatitis C Screening:      Plan:     I have personally reviewed and noted the following in the patient's chart:   . Medical and social history . Use of alcohol, tobacco or illicit drugs  . Current medications and supplements . Functional ability and status . Nutritional status . Physical activity . Advanced directives . List of other physicians . Hospitalizations, surgeries, and ER visits in previous 12 months . Vitals . Screenings  to include cognitive, depression, and falls . Referrals and appointments  In addition, I have reviewed and discussed with patient certain preventive protocols, quality metrics, and best practice recommendations. A written personalized care plan for preventive services as well as general preventive health recommendations were provided to patient.     Hayden Pedro, LPN  27/08/2374

## 2018-02-10 NOTE — Patient Instructions (Signed)
[name] , Thank you for taking time to come for your Medicare Wellness Visit. I appreciate your ongoing commitment to your health goals. Please review the following plan we discussed and let me know if I can assist you in the future.   Screening recommendations/referrals: Colonoscopy: up to date  Mammogram: up to date  Bone Density: up to date  Recommended yearly ophthalmology/optometry visit for glaucoma screening and checkup Recommended yearly dental visit for hygiene and checkup  Vaccinations: Influenza vaccine: up to date   Pneumococcal vaccine: up to date  Tdap vaccine: up to date  Shingles vaccine:    Advanced directives:   Conditions/risks identified:   Next appointment:   Preventive Care 40-64 Years, Female Preventive care refers to lifestyle choices and visits with your health care provider that can promote health and wellness. What does preventive care include?  A yearly physical exam. This is also called an annual well check.  Dental exams once or twice a year.  Routine eye exams. Ask your health care provider how often you should have your eyes checked.  Personal lifestyle choices, including:  Daily care of your teeth and gums.  Regular physical activity.  Eating a healthy diet.  Avoiding tobacco and drug use.  Limiting alcohol use.  Practicing safe sex.  Taking low-dose aspirin daily starting at age 7.  Taking vitamin and mineral supplements as recommended by your health care provider. What happens during an annual well check? The services and screenings done by your health care provider during your annual well check will depend on your age, overall health, lifestyle risk factors, and family history of disease. Counseling  Your health care provider may ask you questions about your:  Alcohol use.  Tobacco use.  Drug use.  Emotional well-being.  Home and relationship well-being.  Sexual activity.  Eating habits.  Work and work  Statistician.  Method of birth control.  Menstrual cycle.  Pregnancy history. Screening  You may have the following tests or measurements:  Height, weight, and BMI.  Blood pressure.  Lipid and cholesterol levels. These may be checked every 5 years, or more frequently if you are over 30 years old.  Skin check.  Lung cancer screening. You may have this screening every year starting at age 36 if you have a 30-pack-year history of smoking and currently smoke or have quit within the past 15 years.  Fecal occult blood test (FOBT) of the stool. You may have this test every year starting at age 60.  Flexible sigmoidoscopy or colonoscopy. You may have a sigmoidoscopy every 5 years or a colonoscopy every 10 years starting at age 47.  Hepatitis C blood test.  Hepatitis B blood test.  Sexually transmitted disease (STD) testing.  Diabetes screening. This is done by checking your blood sugar (glucose) after you have not eaten for a while (fasting). You may have this done every 1-3 years.  Mammogram. This may be done every 1-2 years. Talk to your health care provider about when you should start having regular mammograms. This may depend on whether you have a family history of breast cancer.  BRCA-related cancer screening. This may be done if you have a family history of breast, ovarian, tubal, or peritoneal cancers.  Pelvic exam and Pap test. This may be done every 3 years starting at age 38. Starting at age 69, this may be done every 5 years if you have a Pap test in combination with an HPV test.  Bone density scan. This is  done to screen for osteoporosis. You may have this scan if you are at high risk for osteoporosis. Discuss your test results, treatment options, and if necessary, the need for more tests with your health care provider. Vaccines  Your health care provider may recommend certain vaccines, such as:  Influenza vaccine. This is recommended every year.  Tetanus, diphtheria,  and acellular pertussis (Tdap, Td) vaccine. You may need a Td booster every 10 years.  Zoster vaccine. You may need this after age 37.  Pneumococcal 13-valent conjugate (PCV13) vaccine. You may need this if you have certain conditions and were not previously vaccinated.  Pneumococcal polysaccharide (PPSV23) vaccine. You may need one or two doses if you smoke cigarettes or if you have certain conditions. Talk to your health care provider about which screenings and vaccines you need and how often you need them. This information is not intended to replace advice given to you by your health care provider. Make sure you discuss any questions you have with your health care provider. Document Released: 04/05/2015 Document Revised: 11/27/2015 Document Reviewed: 01/08/2015 Elsevier Interactive Patient Education  2017 [place] Prevention in the Home Falls can cause injuries. They can happen to people of all ages. There are many things you can do to make your home safe and to help prevent falls. What can I do on the outside of my home?  Regularly fix the edges of walkways and driveways and fix any cracks.  Remove anything that might make you trip as you walk through a door, such as a raised step or threshold.  Trim any bushes or trees on the path to your home.  Use bright outdoor lighting.  Clear any walking paths of anything that might make someone trip, such as rocks or tools.  Regularly check to see if handrails are loose or broken. Make sure that both sides of any steps have handrails.  Any raised decks and porches should have guardrails on the edges.  Have any leaves, snow, or ice cleared regularly.  Use sand or salt on walking paths during winter.  Clean up any spills in your garage right away. This includes oil or grease spills. What can I do in the bathroom?  Use night lights.  Install grab bars by the toilet and in the tub and shower. Do not use towel bars as grab  bars.  Use non-skid mats or decals in the tub or shower.  If you need to sit down in the shower, use a plastic, non-slip stool.  Keep the floor dry. Clean up any water that spills on the floor as soon as it happens.  Remove soap buildup in the tub or shower regularly.  Attach bath mats securely with double-sided non-slip rug tape.  Do not have throw rugs and other things on the floor that can make you trip. What can I do in the bedroom?  Use night lights.  Make sure that you have a light by your bed that is easy to reach.  Do not use any sheets or blankets that are too big for your bed. They should not hang down onto the floor.  Have a firm chair that has side arms. You can use this for support while you get dressed.  Do not have throw rugs and other things on the floor that can make you trip. What can I do in the kitchen?  Clean up any spills right away.  Avoid walking on wet floors.  Keep items  that you use a lot in easy-to-reach places.  If you need to reach something above you, use a strong step stool that has a grab bar.  Keep electrical cords out of the way.  Do not use floor polish or wax that makes floors slippery. If you must use wax, use non-skid floor wax.  Do not have throw rugs and other things on the floor that can make you trip. What can I do with my stairs?  Do not leave any items on the stairs.  Make sure that there are handrails on both sides of the stairs and use them. Fix handrails that are broken or loose. Make sure that handrails are as long as the stairways.  Check any carpeting to make sure that it is firmly attached to the stairs. Fix any carpet that is loose or worn.  Avoid having throw rugs at the top or bottom of the stairs. If you do have throw rugs, attach them to the floor with carpet tape.  Make sure that you have a light switch at the top of the stairs and the bottom of the stairs. If you do not have them, ask someone to add them for  you. What else can I do to help prevent falls?  Wear shoes that:  Do not have high heels.  Have rubber bottoms.  Are comfortable and fit you well.  Are closed at the toe. Do not wear sandals.  If you use a stepladder:  Make sure that it is fully opened. Do not climb a closed stepladder.  Make sure that both sides of the stepladder are locked into place.  Ask someone to hold it for you, if possible.  Clearly mark and make sure that you can see:  Any grab bars or handrails.  First and last steps.  Where the edge of each step is.  Use tools that help you move around (mobility aids) if they are needed. These include:  Canes.  Walkers.  Scooters.  Crutches.  Turn on the lights when you go into a dark area. Replace any light bulbs as soon as they burn out.  Set up your furniture so you have a clear path. Avoid moving your furniture around.  If any of your floors are uneven, fix them.  If there are any pets around you, be aware of where they are.  Review your medicines with your doctor. Some medicines can make you feel dizzy. This can increase your chance of falling. Ask your doctor what other things that you can do to help prevent falls. This information is not intended to replace advice given to you by your health care provider. Make sure you discuss any questions you have with your health care provider. Document Released: 01/03/2009 Document Revised: 08/15/2015 Document Reviewed: 04/13/2014 Elsevier Interactive Patient Education  2017 [employer].

## 2018-02-15 ENCOUNTER — Other Ambulatory Visit: Payer: Self-pay | Admitting: Family Medicine

## 2018-02-16 ENCOUNTER — Ambulatory Visit (HOSPITAL_COMMUNITY)
Admission: RE | Admit: 2018-02-16 | Discharge: 2018-02-16 | Disposition: A | Discharge status: Home / Self Care | Payer: 59 | Source: Ambulatory Visit | Attending: Family Medicine | Admitting: Family Medicine

## 2018-02-16 DIAGNOSIS — Z1231 Encounter for screening mammogram for malignant neoplasm of breast: Secondary | ICD-10-CM | POA: Insufficient documentation

## 2018-02-19 ENCOUNTER — Other Ambulatory Visit: Payer: Self-pay | Admitting: Family Medicine

## 2018-02-21 ENCOUNTER — Other Ambulatory Visit: Payer: Self-pay | Admitting: Family Medicine

## 2018-02-21 DIAGNOSIS — R928 Other abnormal and inconclusive findings on diagnostic imaging of breast: Secondary | ICD-10-CM

## 2018-02-22 ENCOUNTER — Other Ambulatory Visit (HOSPITAL_COMMUNITY): Payer: Self-pay

## 2018-02-22 ENCOUNTER — Encounter (HOSPITAL_COMMUNITY): Payer: Self-pay

## 2018-02-22 ENCOUNTER — Inpatient Hospital Stay (HOSPITAL_COMMUNITY): Payer: 59

## 2018-02-22 ENCOUNTER — Ambulatory Visit (HOSPITAL_COMMUNITY): Payer: Self-pay

## 2018-02-22 ENCOUNTER — Inpatient Hospital Stay (HOSPITAL_COMMUNITY): Payer: 59 | Attending: Hematology

## 2018-02-22 ENCOUNTER — Other Ambulatory Visit: Payer: Self-pay

## 2018-02-22 VITALS — BP 132/61 | HR 63 | Temp 96.9°F | Resp 20

## 2018-02-22 DIAGNOSIS — D638 Anemia in other chronic diseases classified elsewhere: Secondary | ICD-10-CM

## 2018-02-22 DIAGNOSIS — N184 Chronic kidney disease, stage 4 (severe): Secondary | ICD-10-CM | POA: Diagnosis not present

## 2018-02-22 DIAGNOSIS — D472 Monoclonal gammopathy: Secondary | ICD-10-CM | POA: Insufficient documentation

## 2018-02-22 DIAGNOSIS — D631 Anemia in chronic kidney disease: Secondary | ICD-10-CM | POA: Insufficient documentation

## 2018-02-22 DIAGNOSIS — I129 Hypertensive chronic kidney disease with stage 1 through stage 4 chronic kidney disease, or unspecified chronic kidney disease: Secondary | ICD-10-CM | POA: Insufficient documentation

## 2018-02-22 DIAGNOSIS — Z79899 Other long term (current) drug therapy: Secondary | ICD-10-CM | POA: Diagnosis not present

## 2018-02-22 LAB — CBC
HEMATOCRIT: 36 % (ref 36.0–46.0)
Hemoglobin: 10.6 g/dL — ABNORMAL LOW (ref 12.0–15.0)
MCH: 22.5 pg — ABNORMAL LOW (ref 26.0–34.0)
MCHC: 29.4 g/dL — AB (ref 30.0–36.0)
MCV: 76.4 fL — ABNORMAL LOW (ref 80.0–100.0)
NRBC: 0 % (ref 0.0–0.2)
Platelets: 314 10*3/uL (ref 150–400)
RBC: 4.71 MIL/uL (ref 3.87–5.11)
RDW: 19.5 % — AB (ref 11.5–15.5)
WBC: 6.1 10*3/uL (ref 4.0–10.5)

## 2018-02-22 MED ORDER — EPOETIN ALFA 20000 UNIT/ML IJ SOLN
20000.0000 [IU] | Freq: Once | INTRAMUSCULAR | Status: AC
  Administered 2018-02-22: 20000 [IU] via SUBCUTANEOUS
  Filled 2018-02-22: qty 1

## 2018-02-22 NOTE — Progress Notes (Signed)
Waldon Reining presents today for injection per MD orders. Procrit administered SQ in right Upper Arm. Administration without incident. Patient tolerated well.

## 2018-03-03 ENCOUNTER — Ambulatory Visit: Payer: Self-pay | Admitting: Internal Medicine

## 2018-03-07 ENCOUNTER — Ambulatory Visit (HOSPITAL_COMMUNITY)
Admission: RE | Admit: 2018-03-07 | Discharge: 2018-03-07 | Disposition: A | Discharge status: Home / Self Care | Payer: 59 | Source: Ambulatory Visit | Attending: Family Medicine | Admitting: Family Medicine

## 2018-03-07 DIAGNOSIS — J189 Pneumonia, unspecified organism: Secondary | ICD-10-CM

## 2018-03-07 DIAGNOSIS — J181 Lobar pneumonia, unspecified organism: Secondary | ICD-10-CM | POA: Diagnosis present

## 2018-03-08 ENCOUNTER — Inpatient Hospital Stay (HOSPITAL_COMMUNITY): Payer: 59

## 2018-03-08 ENCOUNTER — Other Ambulatory Visit (HOSPITAL_COMMUNITY): Payer: Self-pay

## 2018-03-08 ENCOUNTER — Telehealth: Payer: Self-pay

## 2018-03-08 ENCOUNTER — Other Ambulatory Visit: Payer: Self-pay

## 2018-03-08 ENCOUNTER — Ambulatory Visit (INDEPENDENT_AMBULATORY_CARE_PROVIDER_SITE_OTHER): Payer: 59 | Admitting: Family Medicine

## 2018-03-08 ENCOUNTER — Other Ambulatory Visit (HOSPITAL_COMMUNITY)
Admission: RE | Admit: 2018-03-08 | Discharge: 2018-03-08 | Disposition: A | Discharge status: Home / Self Care | Payer: 59 | Source: Ambulatory Visit | Attending: Family Medicine | Admitting: Family Medicine

## 2018-03-08 ENCOUNTER — Encounter: Payer: Self-pay | Admitting: Family Medicine

## 2018-03-08 ENCOUNTER — Encounter (HOSPITAL_COMMUNITY): Payer: Self-pay

## 2018-03-08 VITALS — BP 129/61 | HR 65 | Temp 97.6°F | Resp 18

## 2018-03-08 VITALS — BP 158/78 | HR 86 | Resp 12 | Ht 64.0 in | Wt 217.0 lb

## 2018-03-08 DIAGNOSIS — I129 Hypertensive chronic kidney disease with stage 1 through stage 4 chronic kidney disease, or unspecified chronic kidney disease: Secondary | ICD-10-CM | POA: Diagnosis not present

## 2018-03-08 DIAGNOSIS — N184 Chronic kidney disease, stage 4 (severe): Secondary | ICD-10-CM

## 2018-03-08 DIAGNOSIS — E1122 Type 2 diabetes mellitus with diabetic chronic kidney disease: Secondary | ICD-10-CM | POA: Diagnosis present

## 2018-03-08 DIAGNOSIS — D638 Anemia in other chronic diseases classified elsewhere: Secondary | ICD-10-CM

## 2018-03-08 DIAGNOSIS — E782 Mixed hyperlipidemia: Secondary | ICD-10-CM | POA: Diagnosis not present

## 2018-03-08 DIAGNOSIS — F331 Major depressive disorder, recurrent, moderate: Secondary | ICD-10-CM

## 2018-03-08 DIAGNOSIS — M79603 Pain in arm, unspecified: Secondary | ICD-10-CM

## 2018-03-08 DIAGNOSIS — D472 Monoclonal gammopathy: Secondary | ICD-10-CM

## 2018-03-08 DIAGNOSIS — F322 Major depressive disorder, single episode, severe without psychotic features: Secondary | ICD-10-CM | POA: Diagnosis not present

## 2018-03-08 DIAGNOSIS — Z794 Long term (current) use of insulin: Secondary | ICD-10-CM | POA: Insufficient documentation

## 2018-03-08 DIAGNOSIS — Z0000 Encounter for general adult medical examination without abnormal findings: Secondary | ICD-10-CM | POA: Diagnosis not present

## 2018-03-08 DIAGNOSIS — I10 Essential (primary) hypertension: Secondary | ICD-10-CM

## 2018-03-08 DIAGNOSIS — F489 Nonpsychotic mental disorder, unspecified: Secondary | ICD-10-CM

## 2018-03-08 LAB — LIPID PANEL
Cholesterol: 199 mg/dL (ref 0–200)
HDL: 37 mg/dL — AB (ref >40)
LDL CALC: 114 mg/dL — AB (ref 0–99)
Total CHOL/HDL Ratio: 5.4 RATIO
Triglycerides: 242 mg/dL — ABNORMAL HIGH (ref <150)
VLDL: 48 mg/dL — ABNORMAL HIGH (ref 0–40)

## 2018-03-08 LAB — CBC
HCT: 36.4 % (ref 36.0–46.0)
Hemoglobin: 10.5 g/dL — ABNORMAL LOW (ref 12.0–15.0)
MCH: 22.4 pg — AB (ref 26.0–34.0)
MCHC: 28.8 g/dL — ABNORMAL LOW (ref 30.0–36.0)
MCV: 77.6 fL — AB (ref 80.0–100.0)
NRBC: 0 % (ref 0.0–0.2)
PLATELETS: 328 10*3/uL (ref 150–400)
RBC: 4.69 MIL/uL (ref 3.87–5.11)
RDW: 18.6 % — AB (ref 11.5–15.5)
WBC: 6.8 10*3/uL (ref 4.0–10.5)

## 2018-03-08 LAB — HEPATIC FUNCTION PANEL
ALBUMIN: 3.1 g/dL — AB (ref 3.5–5.0)
ALT: 33 U/L (ref 0–44)
AST: 22 U/L (ref 15–41)
Alkaline Phosphatase: 93 U/L (ref 38–126)
BILIRUBIN INDIRECT: 0.4 mg/dL (ref 0.3–0.9)
Bilirubin, Direct: 0.1 mg/dL (ref 0.0–0.2)
TOTAL PROTEIN: 7.1 g/dL (ref 6.5–8.1)
Total Bilirubin: 0.5 mg/dL (ref 0.3–1.2)

## 2018-03-08 MED ORDER — EPOETIN ALFA 20000 UNIT/ML IJ SOLN
INTRAMUSCULAR | Status: AC
  Filled 2018-03-08: qty 1

## 2018-03-08 MED ORDER — EPOETIN ALFA 20000 UNIT/ML IJ SOLN
20000.0000 [IU] | Freq: Once | INTRAMUSCULAR | Status: AC
  Administered 2018-03-08: 20000 [IU] via SUBCUTANEOUS

## 2018-03-08 MED ORDER — SERTRALINE HCL 50 MG PO TABS: 50.0000 mg | ORAL_TABLET | Freq: Every day | ORAL | 5 refills | Status: DC

## 2018-03-08 NOTE — BH Specialist Note (Signed)
     No flowsheet data found.   No flowsheet data found.        Depression screen [facility] 2/9 03/08/2018 03/08/2018 02/10/2018 02/10/2018 02/01/2018  Decreased Interest 3 2 1  0 0  Down, Depressed, Hopeless 3 1 1  0 0  PHQ - 2 Score 6 3 2  0 0  Altered sleeping 2 1 0 1 1  Tired, decreased energy 3 1 1 1 1   Change in appetite 3 1 0 1 1  Feeling bad or failure about yourself  2 0 0 0 0  Trouble concentrating 3 0 0 0 0  Moving slowly or fidgety/restless 1 0 0 0 0  Suicidal thoughts 0 0 0 0 0  PHQ-9 Score 20 6 3 3 3   Difficult doing work/chores - Somewhat difficult Not difficult at all Somewhat difficult -  Some recent data might be hidden

## 2018-03-08 NOTE — BH Specialist Note (Signed)
Error in Charting

## 2018-03-08 NOTE — Progress Notes (Signed)
    Kathryn Beck     MRN: [id number]      DOB: Mar 11, 1956  HPI: Patient is in for annual physical exam. C/o depression, not suicidal or homicidal but cries often, and very symptomatic C/o left arm pain , currently being managed by Ortho, wanrts to stop tramadol and try topical agent  PE: BP (!) 158/78 (BP Location: Right Arm, Patient Position: Sitting, Cuff Size: Normal)   Pulse 86   Resp 12   Ht 5\' 4"  (1.626 m)   Wt 217 lb (98.4 kg)   SpO2 98% Comment: room air  BMI 37.25 kg/m   Pleasant  female, alert and oriented x 3, in no cardio-pulmonary distress. Afebrile. HEENT No facial trauma or asymetry. Sinuses non tender.  Extra occullar muscles intact, External ears normal, tympanic membranes clear. Oropharynx moist, no exudate. Neck: decreased ROM, no adenopathy,JVD or thyromegaly.No bruits.  Chest: Clear to ascultation bilaterally.No crackles or wheezes. Non tender to palpation  Breast: No asymetry,no masses or lumps. No tenderness. No nipple discharge or inversion. No axillary or supraclavicular adenopathy  Cardiovascular system; Heart sounds normal,  S1 and  S2 ,no S3.  No murmur, or thrill. Apical beat not displaced Peripheral pulses normal.  Abdomen: Soft, non tender, no organomegaly or masses. No bruits. Bowel sounds normal. No guarding, tenderness or rebound.    Musculoskeletal exam: Decreased  ROM of spine, hips , shoulders and knees. No deformity ,swelling or crepitus noted. No muscle wasting or atrophy.   Neurologic: Cranial nerves 2 to 12 intact. Power, tone ,sensation and reflexes normal throughout. No disturbance in gait. No tremor.  Skin: Intact, no ulceration, erythema , scaling or rash noted. Pigmentation normal throughout  Psych; Normal mood and affect. Judgement and concentration normal   Assessment & Plan:  Annual physical exam Annual exam as documented. Counseling done  re healthy lifestyle involving commitment to 150 minutes  exercise per week, heart healthy diet, and attaining healthy weight.The importance of adequate sleep also discussed. Regular seat belt use and home safety, is also discussed. Changes in health habits are decided on by the patient with goals and time frames  set for achieving them. Immunization and cancer screening needs are specifically addressed at this visit.   Depression, major, single episode, severe ([place]) Increase medication dose and refer to therapy, via telepsych, start at OV  Arm pain Generalized pain., most c/o upper extremity and neck pain currently, discontinue tramadol per pt request and start topical medication  Morbid obesity ([place]) Improved, encouraged to continue same Patient re-educated about  the importance of commitment to a  minimum of 150 minutes of exercise per week.  The importance of healthy food choices with portion control discussed. Encouraged to start a food diary, count calories and to consider  joining a support group. Sample diet sheets offered. Goals set by the patient for the next several months.   Weight /BMI 03/08/2018 02/10/2018 02/10/2018  WEIGHT 217 lb 222 lb 220 lb  HEIGHT 5\' 4"  5\' 4"  5\' 4"   BMI 37.25 kg/m2 38.11 kg/m2 37.76 kg/m2

## 2018-03-08 NOTE — BH Specialist Note (Signed)
[facility] Initial Clinical Assessment  MRN: [id number] NAME: Kathryn Beck Date: 03/08/18   Total time: 1 hour  Type of Contact: Video Patient consent obtained: Patient consent obtained for Virtual Visit: Yes Reason for Visit today: Reason for Your Call/Visit Today: Video [facility] Initial Intake Assessment    Treatment History Patient recently received Inpatient Treatment: Have You Recently Been in Any Inpatient Treatment (Hospital/Detox/Crisis Center/28-Day Program)?: No  Facility/Program:  NA  Date of discharge:  NA  Patient currently being seen by therapist/psychiatrist: Do You Currently Have a Therapist/Psychiatrist?: No   Patient currently receiving the following services: Patient Currently Receiving the Following Services:: Medication Management(PCP Prescribes psychiatric medication)   Psychiatric History  Past Psychiatric History/Hospitalization(s): Anxiety: Yes Bipolar Disorder: No Depression: Yes Mania: No Psychosis: No Schizophrenia: No Personality Disorder: No Hospitalization for psychiatric illness: No History of Electroconvulsive Shock Therapy: No Prior Suicide Attempts: No Decreased need for sleep: No  Euphoria: No Self Injurious behaviors No Family History of mental illness: No Family History of substance abuse: No  Substance Abuse: No  DUI: No  Insomnia: No  History of violence No  Physical, sexual or emotional abuse:No  Prior outpatient mental health therapy: No        Clinical Assessment:  PHQ-9 Assessments: Depression screen [facility] 2/9 03/08/2018 03/08/2018 02/10/2018  Decreased Interest 3 2 1   Down, Depressed, Hopeless 3 1 1   PHQ - 2 Score 6 3 2   Altered sleeping 2 1 0  Tired, decreased energy 3 1 1   Change in appetite 3 1 0  Feeling bad or failure about yourself  2 0 0  Trouble concentrating 3 0 0  Moving slowly or fidgety/restless 1 0 0  Suicidal thoughts 0 0 0  PHQ-9 Score 20 6 3   Difficult doing work/chores - Somewhat  difficult Not difficult at all  Some recent data might be hidden    GAD-7 Assessments: GAD 7 : Generalized Anxiety Score 03/08/2018  Nervous, Anxious, on Edge 1  Control/stop worrying 1  Worry too much - different things 0  Trouble relaxing 1  Restless 0  Easily annoyed or irritable 0  Afraid - awful might happen 2  Total GAD 7 Score 5  Anxiety Difficulty Somewhat difficult     Social Functioning Social maturity: Social Maturity: Responsible Social judgement: Social Judgement: Normal  Stress Current stressors: Current Stressors: Job loss/unemployment(Starting dialysis due to kidney failure  ) Familial stressors: Familial Stressors: None Sleep: Sleep: No problems Appetite: Appetite: No problems Coping ability: Coping ability: Exhausted, Overwhelmed  Patient taking medications as prescribed: Patient taking medications as prescribed: Yes  Current medications:  Outpatient Encounter Medications as of 03/08/2018  Medication Sig  . acetaminophen (TYLENOL) 500 MG tablet Take by mouth.  Marland Kitchen amLODipine (NORVASC) 10 MG tablet Take 10 mg by mouth daily.   Marland Kitchen aspirin EC 81 MG tablet Take 1 tablet (81 mg total) by mouth daily.  . camphor-menthol (SARNA) lotion Apply 1 application topically every 8 (eight) hours as needed for itching.  . cloNIDine (CATAPRES) 0.3 MG tablet Take 1 tablet (0.3 mg total) by mouth 3 (three) times daily.  Marland Kitchen dexlansoprazole (DEXILANT) 60 MG capsule TAKE 1 CAPSULE IN THE MORNING WITH FOOD  . ezetimibe (ZETIA) 10 MG tablet Take 1 tablet (10 mg total) by mouth daily.  Marland Kitchen gabapentin (NEURONTIN) 100 MG capsule TAKE (1) CAPSULE BY MOUTH TWICE DAILY.  Marland Kitchen Insulin Detemir (LEVEMIR FLEXTOUCH) 100 UNIT/ML Pen INJECT 50 UNITS INTO THE SKIN TWICE DAILY  . insulin  lispro (HUMALOG KWIKPEN) 100 UNIT/ML KiwkPen INJECT 36-40 units 3x a day before meals  . isosorbide mononitrate (IMDUR) 30 MG 24 hr tablet Take 1 tablet (30 mg total) by mouth daily.  . metolazone (ZAROXOLYN) 2.5 MG  tablet   . olmesartan (BENICAR) 40 MG tablet TAKE 1 TABLET BY MOUTH ONCE A DAY.  . sertraline (ZOLOFT) 50 MG tablet Take 1 tablet (50 mg total) by mouth daily.  . sevelamer (RENAGEL) 800 MG tablet Take by mouth.  . sevelamer carbonate (RENVELA) 800 MG tablet Take 800 mg by mouth 3 (three) times daily with meals.   . torsemide (DEMADEX) 20 MG tablet TAKE (2) TABLETS BY MOUTH ONCE DAILY AS NEEDED (EDEMA OR WEIGHT GAIN;3 POUNDS IN A DAY OR 5 POUNDS IN A WEEK).  Marland Kitchen ULTICARE MINI PEN NEEDLES 31G X 6 MM MISC USE AS DIRECTED  . [DISCONTINUED] FLUoxetine (PROZAC) 10 MG capsule Take 10 mg by mouth daily.    . [DISCONTINUED] glipiZIDE (GLUCOTROL) 10 MG tablet Take 10 mg by mouth 2 (two) times daily before a meal.     No facility-administered encounter medications on file as of 26-Mar-2018.     Self-harm Behaviors Risk Assessment Self-harm risk factors: Self-harm risk factors: (None Reported) Patient endorses recent thoughts of harming self: Have you recently had any thoughts about harming yourself?: No  [place] Suicide Severity Rating Scale:  C-SRSS 03-26-2018  1. Wish to be Dead No  2. Suicidal Thoughts No  6. Suicide Behavior Question No    Danger to Others Risk Assessment Danger to others risk factors: Danger to Others Risk Factors: No risk factors noted Patient endorses recent thoughts of harming others: Notification required: No need or identified person    Substance Use Assessment Patient recently consumed alcohol:  None Reported  Alcohol Use Disorder Identification Test (AUDIT):  Alcohol Use Disorder Test (AUDIT) 03-26-2018  1. How often do you have a drink containing alcohol? 0  2. How many drinks containing alcohol do you have on a typical day when you are drinking? 0  3. How often do you have six or more drinks on one occasion? 0  AUDIT-C Score 0  Intervention/Follow-up AUDIT Score <7 follow-up not indicated   Patient recently used drugs:  None Reported    Patient is concerned  about dependence or abuse of substances:  None Reported    Goals, Interventions and Follow-up Plan Goals: Increase healthy adjustment to current life circumstances Interventions: Motivational Interviewing and Supportive Counseling Follow-up Plan: VBH Phone Follow UP     Summary of Clinical Assessment Summary:  Video Initial V[facility] Intake Assessment:  Patient is a 62 year old female.  Patient was referred through the [facility] Referral Que.    Stressor: Patient has to receive dialysis three times a week.  Patient has been experiencing chronic pain from arthritis.  The pain from her arthritis causes her depression.  Motivational Interviewing;  Probation officer discussed patient feelings of fear and anxiety regarding her upcoming dialysis.  Patient acknowledged that she has issues with her kidney since 2005 but she did not know that it was this bad.  Writer praised client for taking the matte of of health very seriously now.     Supportive Counseling:  Writer encouraged patient to list all of the things in her life that bring her happiness and joy.  Writer discussed importance of allowing herself to express her feeling of anxiety of the unknown surrounding going to dialysis three times a day.  Social History: Married to her  husband for 42 years.  Awaiting to receive disability.  Previously working ass a Quarry manager with adults.    Support from her older and younger sister, husband, 2 adult children and four grandchildren.  For fun the patient enjoyed going to yard sales with her sister and cooking.  Medication: Patient reports compliant with taking Zoloft since June 2019.  Her PCP increased her Zoloft from 25mg  to 50mg .    Patient denies SI/HI/Psychosis/Substance Abuse. If your symptoms worsen or you have thoughts of suicide/homicide, PLEASE SEEK IMMEDIATE MEDICAL ATTENTION.  You may always call:  National Suicide Hotline: (956)329-0132;  [place] Crisis Line: (367)770-2417;  Crisis Recovery in [place]:  [phone].  These are available 24 hours a day, 7 days a week.  Denies prior inpatient psychiatric hospitalization or outpatient therapy.  Patient denies prior psychiatric inpatient hospitalization or outpatient therapy.     Graciella Freer [place], LCAS-A

## 2018-03-08 NOTE — Patient Instructions (Signed)
F/u in 6 weeks , call if you need me before.  New medication sent to [facility] to  use for pain, stop tramadol  Increase in dose of sertraline for depression to 50 mg daily, OK to take TWO 25 mg tablets together until done, but new dose has been prescribed   You will speak with the  therapist today ,in office then after this she will maintain contact at home , this will help you alot  Labs today at the hospital please, copy to your endocrinologist  We will send for your eye exam in [place] for 2020!

## 2018-03-08 NOTE — Progress Notes (Signed)
ARALI SOMERA presents today for injection per the provider's orders.  Procrit administration without incident; see MAR for injection details.  Patient tolerated procedure well and without incident.  No questions or complaints noted at this time.  Discharged via wheelchair in c/o spouse.

## 2018-03-09 ENCOUNTER — Encounter: Payer: Self-pay | Admitting: Family Medicine

## 2018-03-09 DIAGNOSIS — F322 Major depressive disorder, single episode, severe without psychotic features: Secondary | ICD-10-CM | POA: Insufficient documentation

## 2018-03-09 DIAGNOSIS — F32A Depression, unspecified: Secondary | ICD-10-CM | POA: Insufficient documentation

## 2018-03-09 LAB — HEMOGLOBIN A1C
Hgb A1c MFr Bld: 8.4 % — ABNORMAL HIGH (ref 4.8–5.6)
MEAN PLASMA GLUCOSE: 194.38 mg/dL

## 2018-03-09 NOTE — Assessment & Plan Note (Signed)
Generalized pain., most c/o upper extremity and neck pain currently, discontinue tramadol per pt request and start topical medication

## 2018-03-09 NOTE — Assessment & Plan Note (Signed)
Improved, encouraged to continue same Patient re-educated about  the importance of commitment to a  minimum of 150 minutes of exercise per week.  The importance of healthy food choices with portion control discussed. Encouraged to start a food diary, count calories and to consider  joining a support group. Sample diet sheets offered. Goals set by the patient for the next several months.   Weight /BMI 03/08/2018 02/10/2018 02/10/2018  WEIGHT 217 lb 222 lb 220 lb  HEIGHT 5\' 4"  5\' 4"  5\' 4"   BMI 37.25 kg/m2 38.11 kg/m2 37.76 kg/m2

## 2018-03-09 NOTE — Assessment & Plan Note (Signed)

## 2018-03-09 NOTE — Assessment & Plan Note (Signed)
Increase medication dose and refer to therapy, via telepsych, start at [place]

## 2018-03-09 NOTE — Progress Notes (Signed)
Kathryn Beck is a 62 y.o. 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 Worsening depression, anxiety in the context of upcoming dialysis. Applying for disability. Married for 42 years. Sertraline was started by PCP  # MDD Continue sertraline with uptitration.   Recommendation - Continue sertraline with uptitration (targeted dose 50-200 mg /day) - BH specialist to coach behavioral activation

## 2018-03-11 ENCOUNTER — Other Ambulatory Visit: Payer: Self-pay | Admitting: Family Medicine

## 2018-03-12 ENCOUNTER — Encounter: Payer: Self-pay | Admitting: Family Medicine

## 2018-03-12 NOTE — Assessment & Plan Note (Signed)
Clinically improving, however aware of the need to complete antibiotic course entirely, and have f/u imaging study

## 2018-03-12 NOTE — Assessment & Plan Note (Signed)
Currently stable , no decompensation at this time

## 2018-03-12 NOTE — Assessment & Plan Note (Signed)
Pain medication changed to tyenol only and one tramadol

## 2018-03-12 NOTE — Assessment & Plan Note (Signed)
Deteriorated. Patient re-educated about  the importance of commitment to a  minimum of 150 minutes of exercise per week.  The importance of healthy food choices with portion control discussed. Encouraged to start a food diary, count calories and to consider  joining a support group. Sample diet sheets offered. Goals set by the patient for the next several months.   Weight /BMI 03/08/2018 02/10/2018 02/10/2018  WEIGHT 217 lb 222 lb 220 lb  HEIGHT 5\' 4"  5\' 4"  5\' 4"   BMI 37.25 kg/m2 38.11 kg/m2 37.76 kg/m2

## 2018-03-12 NOTE — Assessment & Plan Note (Signed)
Elevated and uncontrolled, medication adjustment made and willf/u in 4 weeks

## 2018-03-12 NOTE — Progress Notes (Signed)
Kathryn Beck     MRN: [id number]      DOB: 03-10-1956   HPI Kathryn Beck is here for follow up of recent ED visit when she was diagnosed with pneumonia 4 days ago. At the time she had a lot of shortness of breath and malaise, states she is feeling somewhat better but understands the need to entirely complete the antibiotic course Still has sputum production but less thick, will have CXR repeated in 4 weeks Denies sinus pressure and drainage Denies polyuria, polydipsia, blurred vision , or hypoglycemic episodes. States blood sugar has been a bit higher in past 1 week ROS . Denies chest pains, palpitations and leg swelling Denies abdominal pain, nausea, vomiting,diarrhea or constipation.   Denies dysuria, frequency, hesitancy or incontinence. C/o increased  joint pain, swelling and limitation in mobility. Denies headaches, seizures, numbness, or tingling. Denies depression, anxiety or insomnia. Denies skin break down or rash.   PE  BP (!) 170/82   Pulse 83   Resp 16   Ht 5\' 4"  (1.626 m)   Wt 222 lb (100.7 kg)   SpO2 96%   BMI 38.11 kg/m   Patient alert and oriented and ill appearing HEENT: No facial asymmetry, EOMI,   oropharynx pink and moist.  Neck supple no JVD, no mass.  Chest: adequate though reduced air entry with bronchial breath sounds and few wheezes.  CVS: S1, S2 no murmurs, no S3.Regular rate.  ABD: Soft non tender.   Ext: No edema  MS: Adequate though reduced ROM spine, shoulders, hips and knees.  Skin: Intact, no ulcerations or rash noted.  Psych: Good eye contact, normal affect. Memory intact not anxious or depressed appearing.  CNS: CN 2-12 intact, power,  normal throughout.no focal deficits noted.   Assessment & Plan  Pneumonia Clinically improving, however aware of the need to complete antibiotic course entirely, and have f/u imaging study  Morbid obesity ([place]) Deteriorated. Patient re-educated about  the importance of commitment to a   minimum of 150 minutes of exercise per week.  The importance of healthy food choices with portion control discussed. Encouraged to start a food diary, count calories and to consider  joining a support group. Sample diet sheets offered. Goals set by the patient for the next several months.   Weight /BMI 03/08/2018 02/10/2018 02/10/2018  WEIGHT 217 lb 222 lb 220 lb  HEIGHT 5\' 4"  5\' 4"  5\' 4"   BMI 37.25 kg/m2 38.11 kg/m2 37.76 kg/m2      Chronic diastolic CHF (congestive heart failure) (HCC) Currently stable , no decompensation at this time  Malignant hypertension Elevated and uncontrolled, medication adjustment made and willf/u in 4 weeks  Type 2 diabetes mellitus with stage 4 chronic kidney disease, with long-term current use of insulin ([place]) Managed by endo, improving gradually but still not at goal Kathryn Beck is reminded of the importance of commitment to daily physical activity for 30 minutes or more, as able and the need to limit carbohydrate intake to 30 to 60 grams per meal to help with blood sugar control.   The need to take medication as prescribed, test blood sugar as directed, and to call between visits if there is a concern that blood sugar is uncontrolled is also discussed.   Kathryn Beck is reminded of the importance of daily foot exam, annual eye examination, and good blood sugar, blood pressure and cholesterol control.  Diabetic Labs Latest Ref Rng & Units 03/08/2018 02/06/2018 01/17/2018 11/26/2017 11/18/2017  HbA1c 4.8 -  5.6 % 8.4(H) - - - -  Microalbumin Not estab mg/dL - - - - -  Micro/Creat Ratio <30 mcg/mg creat - - - - -  Chol 0 - 200 mg/dL 199 - - - -  HDL >40 mg/dL 37(L) - - - -  Calc LDL 0 - 99 mg/dL 114(H) - - - -  Triglycerides <150 mg/dL 242(H) - - - -  Creatinine 0.44 - 1.00 mg/dL - 4.69(H) 4.64(H) 3.10(H) 2.88(H)   BP/Weight 03/08/2018 03/08/2018 02/22/2018 02/10/2018 02/10/2018 02/07/2018 52/10/221  Systolic BP 361 224 497 530 051 102 111  Diastolic  BP 61 78 61 82 78 67 60  Wt. (Lbs) - 217 - 222 220 - 219.8  BMI - 37.25 - 38.11 37.76 - 37.73   Foot/eye exam completion dates Latest Ref Rng & Units 12/20/2017 08/24/2016  Eye Exam No Retinopathy - -  Foot Form Completion - Done Done        Encounter for examination following treatment at hospital Diagnosed with LLL pneumonia on 02/06/2018, improving clinically on antibiotic course, will repeat cXR in 4 weeks to ensure clearing

## 2018-03-12 NOTE — Assessment & Plan Note (Signed)
Diagnosed with LLL pneumonia on 02/06/2018, improving clinically on antibiotic course, will repeat cXR in 4 weeks to ensure clearing

## 2018-03-12 NOTE — Assessment & Plan Note (Signed)
Managed by endo, improving gradually but still not at goal Kathryn Beck is reminded of the importance of commitment to daily physical activity for 30 minutes or more, as able and the need to limit carbohydrate intake to 30 to 60 grams per meal to help with blood sugar control.   The need to take medication as prescribed, test blood sugar as directed, and to call between visits if there is a concern that blood sugar is uncontrolled is also discussed.   Kathryn Beck is reminded of the importance of daily foot exam, annual eye examination, and good blood sugar, blood pressure and cholesterol control.  Diabetic Labs Latest Ref Rng & Units 03/08/2018 02/06/2018 01/17/2018 11/26/2017 11/18/2017  HbA1c 4.8 - 5.6 % 8.4(H) - - - -  Microalbumin Not estab mg/dL - - - - -  Micro/Creat Ratio <30 mcg/mg creat - - - - -  Chol 0 - 200 mg/dL 199 - - - -  HDL >40 mg/dL 37(L) - - - -  Calc LDL 0 - 99 mg/dL 114(H) - - - -  Triglycerides <150 mg/dL 242(H) - - - -  Creatinine 0.44 - 1.00 mg/dL - 4.69(H) 4.64(H) 3.10(H) 2.88(H)   BP/Weight 03/08/2018 03/08/2018 02/22/2018 02/10/2018 02/10/2018 02/07/2018 97/67/3419  Systolic BP 379 024 097 353 299 242 683  Diastolic BP 61 78 61 82 78 67 60  Wt. (Lbs) - 217 - 222 220 - 219.8  BMI - 37.25 - 38.11 37.76 - 37.73   Foot/eye exam completion dates Latest Ref Rng & Units 12/20/2017 08/24/2016  Eye Exam No Retinopathy - -  Foot Form Completion - Done Done

## 2018-03-12 NOTE — Assessment & Plan Note (Signed)
Improved but not at goal, medication adjustment made wit close f/u DASH diet and commitment to daily physical activity for a minimum of 30 minutes discussed and encouraged, as a part of hypertension management. The importance of attaining a healthy weight is also discussed.  BP/Weight 03/08/2018 03/08/2018 02/22/2018 02/10/2018 02/10/2018 02/07/2018 41/66/0630  Systolic BP 160 109 323 557 322 025 427  Diastolic BP 61 78 61 82 78 67 60  Wt. (Lbs) - 217 - 222 220 - 219.8  BMI - 37.25 - 38.11 37.76 - 37.73

## 2018-03-12 NOTE — Assessment & Plan Note (Signed)
Z pack prescribed, and saline nasal flushes recommended

## 2018-03-12 NOTE — Assessment & Plan Note (Signed)
Managed by endo and reports improvement Kathryn Beck is reminded of the importance of commitment to daily physical activity for 30 minutes or more, as able and the need to limit carbohydrate intake to 30 to 60 grams per meal to help with blood sugar control.   The need to take medication as prescribed, test blood sugar as directed, and to call between visits if there is a concern that blood sugar is uncontrolled is also discussed.   Kathryn Beck is reminded of the importance of daily foot exam, annual eye examination, and good blood sugar, blood pressure and cholesterol control.  Diabetic Labs Latest Ref Rng & Units 03/08/2018 02/06/2018 01/17/2018 11/26/2017 11/18/2017  HbA1c 4.8 - 5.6 % 8.4(H) - - - -  Microalbumin Not estab mg/dL - - - - -  Micro/Creat Ratio <30 mcg/mg creat - - - - -  Chol 0 - 200 mg/dL 199 - - - -  HDL >40 mg/dL 37(L) - - - -  Calc LDL 0 - 99 mg/dL 114(H) - - - -  Triglycerides <150 mg/dL 242(H) - - - -  Creatinine 0.44 - 1.00 mg/dL - 4.69(H) 4.64(H) 3.10(H) 2.88(H)   BP/Weight 03/08/2018 03/08/2018 02/22/2018 02/10/2018 02/10/2018 02/07/2018 77/41/2878  Systolic BP 676 720 947 096 283 662 947  Diastolic BP 61 78 61 82 78 67 60  Wt. (Lbs) - 217 - 222 220 - 219.8  BMI - 37.25 - 38.11 37.76 - 37.73   Foot/eye exam completion dates Latest Ref Rng & Units 12/20/2017 08/24/2016  Eye Exam No Retinopathy - -  Foot Form Completion - Done Done

## 2018-03-12 NOTE — Progress Notes (Signed)
Kathryn Beck     MRN: [id number]      DOB: January 22, 1956   HPI Kathryn Beck is here for follow up and re-evaluation of chronic medical conditions,in particular hypertension medication management and review of any available recent lab and radiology data.  Preventive health is updated, specifically  Cancer screening and Immunization.   Questions or concerns regarding consultations or c/o sinus pressure in cheeks with sore throat and thick drainage. Denies cough or wheezing. C/o uncontrolled back pain and leg swelling   ROS Denies recent fever or chills. Denies sinus pressure, nasal congestion, ear pain or sore throat. Denies chest congestion, productive cough or wheezing. Denies chest pains, palpitations , PND oroorthopnea Denies abdominal pain, nausea, vomiting,diarrhea or constipation.   Denies dysuria, frequency, hesitancy or incontinence.  Denies uncontrolled depression, anxiety or insomnia. Denies skin break down or rash.   PE  BP 140/78   Pulse 71   Resp 16   Ht 5\' 4"  (1.626 m)   Wt 220 lb (99.8 kg)   SpO2 98%   BMI 37.76 kg/m   Patient alert and oriented and in no cardiopulmonary distress.  HEENT: No facial asymmetry, EOMI,   oropharynx pink and moist.  Neck supple no JVD, no mass.Maxillary sinus tenderness  Chest: Clear to auscultation bilaterally.  CVS: S1, S2 no murmurs, no S3.Regular rate.  ABD: Soft non tender.   Ext: Trace  edema  MS: Decreased ROM spine, shoulders, hips and knees.  Skin: Intact, no ulcerations or rash noted.  Psych: Good eye contact, normal affect. Memory intact not anxious or depressed appearing.  CNS: CN 2-12 intact, power,  normal throughout.no focal deficits noted.   Assessment & Plan  Malignant hypertension Improved but not at goal, medication adjustment made wit close f/u DASH diet and commitment to daily physical activity for a minimum of 30 minutes discussed and encouraged, as a part of hypertension management. The  importance of attaining a healthy weight is also discussed.  BP/Weight 03/08/2018 03/08/2018 02/22/2018 02/10/2018 02/10/2018 02/07/2018 17/49/4496  Systolic BP 759 163 846 659 935 701 779  Diastolic BP 61 78 61 82 78 67 60  Wt. (Lbs) - 217 - 222 220 - 219.8  BMI - 37.25 - 38.11 37.76 - 37.73       Back pain with left-sided radiculopathy Pain medication changed to tyenol only and one tramadol  Uncontrolled type 2 diabetes mellitus with nephropathy ([place]) Managed by endo and reports improvement Kathryn Beck is reminded of the importance of commitment to daily physical activity for 30 minutes or more, as able and the need to limit carbohydrate intake to 30 to 60 grams per meal to help with blood sugar control.   The need to take medication as prescribed, test blood sugar as directed, and to call between visits if there is a concern that blood sugar is uncontrolled is also discussed.   Kathryn Beck is reminded of the importance of daily foot exam, annual eye examination, and good blood sugar, blood pressure and cholesterol control.  Diabetic Labs Latest Ref Rng & Units 03/08/2018 02/06/2018 01/17/2018 11/26/2017 11/18/2017  HbA1c 4.8 - 5.6 % 8.4(H) - - - -  Microalbumin Not estab mg/dL - - - - -  Micro/Creat Ratio <30 mcg/mg creat - - - - -  Chol 0 - 200 mg/dL 199 - - - -  HDL >40 mg/dL 37(L) - - - -  Calc LDL 0 - 99 mg/dL 114(H) - - - -  Triglycerides <150 mg/dL  242(H) - - - -  Creatinine 0.44 - 1.00 mg/dL - 4.69(H) 4.64(H) 3.10(H) 2.88(H)   BP/Weight 03/08/2018 03/08/2018 02/22/2018 02/10/2018 02/10/2018 02/07/2018 71/95/9747  Systolic BP 185 501 586 825 749 355 217  Diastolic BP 61 78 61 82 78 67 60  Wt. (Lbs) - 217 - 222 220 - 219.8  BMI - 37.25 - 38.11 37.76 - 37.73   Foot/eye exam completion dates Latest Ref Rng & Units 12/20/2017 08/24/2016  Eye Exam No Retinopathy - -  Foot Form Completion - Done Done        Acute maxillary sinusitis Z pack prescribed, and saline nasal  flushes recommended

## 2018-03-14 ENCOUNTER — Other Ambulatory Visit: Payer: Self-pay

## 2018-03-14 MED ORDER — INSULIN DETEMIR 100 UNIT/ML FLEXPEN: PEN_INJECTOR | SUBCUTANEOUS | 3 refills | Status: DC

## 2018-03-22 ENCOUNTER — Encounter (HOSPITAL_COMMUNITY): Payer: Self-pay

## 2018-03-22 ENCOUNTER — Ambulatory Visit (HOSPITAL_COMMUNITY)
Admission: RE | Admit: 2018-03-22 | Discharge: 2018-03-22 | Disposition: A | Discharge status: Home / Self Care | Payer: 59 | Source: Ambulatory Visit | Attending: Family Medicine | Admitting: Family Medicine

## 2018-03-22 ENCOUNTER — Ambulatory Visit (INDEPENDENT_AMBULATORY_CARE_PROVIDER_SITE_OTHER): Payer: 59

## 2018-03-22 ENCOUNTER — Inpatient Hospital Stay (HOSPITAL_COMMUNITY): Payer: 59

## 2018-03-22 ENCOUNTER — Ambulatory Visit (HOSPITAL_COMMUNITY): Payer: 59

## 2018-03-22 VITALS — BP 118/52 | HR 63 | Temp 97.6°F | Resp 18

## 2018-03-22 DIAGNOSIS — R928 Other abnormal and inconclusive findings on diagnostic imaging of breast: Secondary | ICD-10-CM | POA: Diagnosis present

## 2018-03-22 DIAGNOSIS — D472 Monoclonal gammopathy: Secondary | ICD-10-CM | POA: Diagnosis not present

## 2018-03-22 DIAGNOSIS — N184 Chronic kidney disease, stage 4 (severe): Secondary | ICD-10-CM

## 2018-03-22 DIAGNOSIS — Z111 Encounter for screening for respiratory tuberculosis: Secondary | ICD-10-CM

## 2018-03-22 DIAGNOSIS — D638 Anemia in other chronic diseases classified elsewhere: Secondary | ICD-10-CM

## 2018-03-22 LAB — CBC
HCT: 34.1 % — ABNORMAL LOW (ref 36.0–46.0)
Hemoglobin: 9.7 g/dL — ABNORMAL LOW (ref 12.0–15.0)
MCH: 22.1 pg — ABNORMAL LOW (ref 26.0–34.0)
MCHC: 28.4 g/dL — ABNORMAL LOW (ref 30.0–36.0)
MCV: 77.9 fL — ABNORMAL LOW (ref 80.0–100.0)
Platelets: 408 10*3/uL — ABNORMAL HIGH (ref 150–400)
RBC: 4.38 MIL/uL (ref 3.87–5.11)
RDW: 17.7 % — ABNORMAL HIGH (ref 11.5–15.5)
WBC: 6.4 10*3/uL (ref 4.0–10.5)
nRBC: 0 % (ref 0.0–0.2)

## 2018-03-22 MED ORDER — EPOETIN ALFA 20000 UNIT/ML IJ SOLN
20000.0000 [IU] | Freq: Once | INTRAMUSCULAR | Status: AC
  Administered 2018-03-22: 20000 [IU] via SUBCUTANEOUS
  Filled 2018-03-22: qty 1

## 2018-03-22 NOTE — Progress Notes (Signed)
Patient came in today to have TB skin test administered so that she can begin dialysis. Test administered in RIGHT forearm. Wheal obtained. Patient told to come back Thursday morning to have test read. She verbalized understanding.

## 2018-03-22 NOTE — Patient Instructions (Signed)
[facility]  Discharge Instructions:   _______________________________________________________________  Thank you for choosing [facility] to provide your oncology and hematology care.  To afford each patient quality time with our providers, please arrive at least 15 minutes before your scheduled appointment.  You need to re-schedule your appointment if you arrive 10 or more minutes late.  We strive to give you quality time with our providers, and arriving late affects you and other patients whose appointments are after yours.  Also, if you no show three or more times for appointments you may be dismissed from the clinic.  Again, thank you for choosing [facility]. Our hope is that these requests will allow you access to exceptional care and in a timely manner. _______________________________________________________________  If you have questions after your visit, please contact our office at [phone] between the hours of 8:30 a.m. and 5:00 p.m. Voicemails left after 4:30 p.m. will not be returned until the following business day. _______________________________________________________________  For prescription refill requests, have your pharmacy contact our office. _______________________________________________________________  Recommendations made by the consultant and any test results will be sent to your referring physician. _______________________________________________________________ 

## 2018-03-22 NOTE — Progress Notes (Signed)
Kathryn Beck presents today for  Procrit 20,000 injection per MD orders. Procrit 20,000 administered SQ in left Upper Arm. Administration without incident. Patient tolerated well.   Vital signs stable. No complaints at this time. Discharged from clinic ambulatory. F/U with [facility] as scheduled. Appt schedule printed off and given to patient. MAR updated and reviewed with patient.

## 2018-03-25 LAB — TB SKIN TEST
Induration: 0 mm
TB Skin Test: NEGATIVE

## 2018-04-04 ENCOUNTER — Other Ambulatory Visit (HOSPITAL_COMMUNITY): Payer: Self-pay

## 2018-04-04 DIAGNOSIS — N184 Chronic kidney disease, stage 4 (severe): Secondary | ICD-10-CM

## 2018-04-04 DIAGNOSIS — D638 Anemia in other chronic diseases classified elsewhere: Secondary | ICD-10-CM

## 2018-04-05 ENCOUNTER — Inpatient Hospital Stay (HOSPITAL_COMMUNITY): Payer: 59 | Attending: Hematology

## 2018-04-05 ENCOUNTER — Inpatient Hospital Stay (HOSPITAL_COMMUNITY): Payer: 59

## 2018-04-07 ENCOUNTER — Telehealth: Payer: Self-pay

## 2018-04-07 ENCOUNTER — Telehealth: Payer: Self-pay | Admitting: Orthopedic Surgery

## 2018-04-07 ENCOUNTER — Other Ambulatory Visit: Payer: Self-pay | Admitting: Family Medicine

## 2018-04-07 DIAGNOSIS — F331 Major depressive disorder, recurrent, moderate: Secondary | ICD-10-CM

## 2018-04-07 NOTE — Telephone Encounter (Signed)
I called her and gave her the number to [facility] to call and RS MRI, she did not have the scan in October.

## 2018-04-07 NOTE — Telephone Encounter (Signed)
I called this patient about a referral for her L Knee and she stated she had been waiting to hear from our office about an MRI appointment for her L Knee. In her chart I saw where you had tried to call her with that information and I told her this. She wanted to know if she can get the MRI scheduled again and I told her that I would send a message to Dr.Harrison's assistant. She stated if we could not in touch with her at her number than to try her husband's number: [phone] or her daughter's number [phone].    Please call and advise.

## 2018-04-11 ENCOUNTER — Other Ambulatory Visit: Payer: Self-pay | Admitting: Family Medicine

## 2018-04-13 ENCOUNTER — Other Ambulatory Visit: Payer: Self-pay | Admitting: Family Medicine

## 2018-04-14 ENCOUNTER — Telehealth: Payer: Self-pay | Admitting: Radiology

## 2018-04-14 ENCOUNTER — Telehealth: Payer: Self-pay | Admitting: Orthopedic Surgery

## 2018-04-14 NOTE — Telephone Encounter (Signed)
Note   [phone]    Approval has probably expired, since this was in October   Tried online and the Josem Kaufmann has expired.

## 2018-04-14 NOTE — Telephone Encounter (Signed)
-----   Message from Uvaldo Bristle sent at 04/14/2018 10:18 AM EST ----- Regarding: Voice msg from [facility] Amy, RE: [phone] [place] [facility] (did not leave contact name) [phone] had left a voice msg yesterday, 2:15pm, 04/13/2018, relaying that patient's MRI requires pre-authorization.  It appears that pre-cert# is in chart under MRI referral.  Thank you,Carol

## 2018-04-14 NOTE — Telephone Encounter (Signed)
A case was started by [facility], so this blocked me from completing online request, so I had to call and start ANOTHER case. The case # is [id number] fax [phone]  Faxed clinicals

## 2018-04-15 ENCOUNTER — Ambulatory Visit (HOSPITAL_COMMUNITY): Payer: 59

## 2018-04-15 NOTE — Telephone Encounter (Signed)
Has been approved  sent Email to April Pait, and put in system

## 2018-04-18 ENCOUNTER — Telehealth: Payer: Self-pay | Admitting: Emergency Medicine

## 2018-04-18 ENCOUNTER — Ambulatory Visit (HOSPITAL_COMMUNITY)
Admission: RE | Admit: 2018-04-18 | Discharge: 2018-04-18 | Disposition: A | Discharge status: Home / Self Care | Payer: 59 | Source: Ambulatory Visit | Attending: Vascular Surgery | Admitting: Vascular Surgery

## 2018-04-18 ENCOUNTER — Other Ambulatory Visit (HOSPITAL_COMMUNITY): Payer: Self-pay | Admitting: *Deleted

## 2018-04-18 ENCOUNTER — Ambulatory Visit (INDEPENDENT_AMBULATORY_CARE_PROVIDER_SITE_OTHER): Payer: 59 | Admitting: Vascular Surgery

## 2018-04-18 ENCOUNTER — Encounter: Payer: Self-pay | Admitting: Vascular Surgery

## 2018-04-18 VITALS — BP 160/71 | HR 104 | Temp 98.0°F | Resp 18 | Ht 64.0 in | Wt 211.8 lb

## 2018-04-18 DIAGNOSIS — Z992 Dependence on renal dialysis: Secondary | ICD-10-CM

## 2018-04-18 DIAGNOSIS — N185 Chronic kidney disease, stage 5: Secondary | ICD-10-CM

## 2018-04-18 DIAGNOSIS — N186 End stage renal disease: Secondary | ICD-10-CM | POA: Diagnosis not present

## 2018-04-18 NOTE — Progress Notes (Signed)
Vascular and Vein Specialist of [facility] office  Patient name: Kathryn Beck MRN: [id number] DOB: 1955-07-09 Sex: female  REASON FOR VISIT: Valuation left arm AV fistula  HPI: Kathryn Beck is a 63 y.o. female here today for evaluation of her left arm AV fistula.  She initially had a left radiocephalic fistula which failed to mature.  She subsequently underwent left brachiocephalic fistula in June 7253.  She was seen in our office in August 2019 and it was felt that this was maturing nicely.  She has subsequently initiated hemodialysis initially was undergoing hemodialysis via her catheter.  She has had several attempts at use of her fistula.  She reports that she had adequate dialysis for her first treatment but then has had infiltration and difficulty clotting the machine.  Does report that she is being evaluated for peritoneal dialysis but has not had a catheter placed yet.  Past Medical History:  Diagnosis Date  . Acid reflux   . Anemia   . Arthritis   . Axillary masses    Soft tissue - status post excision  . Back pain   . CKD (chronic kidney disease) stage 5, GFR less than 15 ml/min (HCC)   . Diastolic heart failure ([place])   . Essential hypertension   . Ischemic heart disease    Abnormal Myoview April 2018 - medical therapy  . Mixed hyperlipidemia   . Obesity   . Pancreatitis   . Sleep apnea    Noncompliant with CPAP  . Type 2 diabetes mellitus, uncontrolled (HCC)     Family History  Problem Relation Age of Onset  . Hypertension Father   . Hypercholesterolemia Father   . Arthritis Father   . Hypertension Sister   . Hypercholesterolemia Sister   . Breast cancer Sister   . Hypertension Sister   . Colon cancer Neg Hx   . Colon polyps Neg Hx     SOCIAL HISTORY: Social History   Tobacco Use  . Smoking status: Never Smoker  . Smokeless tobacco: Never Used  Substance Use Topics  . Alcohol use: No    Allergies   Allergen Reactions  . Ace Inhibitors Anaphylaxis and Swelling  . Penicillins Itching, Swelling and Other (See Comments)    SWELLING REACTION UNSPECIFIED   PATIENT HAS HAD A PCN REACTION WITH IMMEDIATE RASH, FACIAL/TONGUE/THROAT SWELLING, SOB, OR LIGHTHEADEDNESS WITH HYPOTENSION:  #  #  YES  #  #  Has patient had a PCN reaction causing severe rash involving mucus membranes or skin necrosis: No Has patient had a PCN reaction that required hospitalization No Has patient had a PCN reaction occurring within the last 10 years: No If all of the above answers are "NO", then may proceed with Cephalosporin use.   . Statins Other (See Comments)    elevated LFT's  . Albuterol Swelling    SWELLING REACTION UNSPECIFIED     Current Outpatient Medications  Medication Sig Dispense Refill  . acetaminophen (TYLENOL) 500 MG tablet Take by mouth.    Marland Kitchen amLODipine (NORVASC) 10 MG tablet TAKE 1 TABLET BY MOUTH ONCE DAILY. 30 tablet 5  . aspirin EC 81 MG tablet Take 1 tablet (81 mg total) by mouth daily. 90 tablet 1  . camphor-menthol (SARNA) lotion Apply 1 application topically every 8 (eight) hours as needed for itching. 222 mL 0  . cloNIDine (CATAPRES) 0.3 MG tablet TAKE (1) TABLET BY MOUTH THREE TIMES DAILY 84 tablet 4  . dexlansoprazole (DEXILANT) 60  MG capsule TAKE 1 CAPSULE IN THE MORNING WITH FOOD 28 capsule 10  . ezetimibe (ZETIA) 10 MG tablet Take 1 tablet (10 mg total) by mouth daily. 30 tablet 5  . gabapentin (NEURONTIN) 100 MG capsule TAKE (1) CAPSULE BY MOUTH TWICE DAILY. 56 capsule 10  . Insulin Detemir (LEVEMIR FLEXTOUCH) 100 UNIT/ML Pen INJECT 50 UNITS INTO THE SKIN TWICE DAILY 90 mL 3  . insulin lispro (HUMALOG KWIKPEN) 100 UNIT/ML KiwkPen INJECT 36-40 units 3x a day before meals 90 mL 3  . isosorbide mononitrate (IMDUR) 30 MG 24 hr tablet Take 1 tablet (30 mg total) by mouth daily. 30 tablet 5  . metolazone (ZAROXOLYN) 2.5 MG tablet   0  . metolazone (ZAROXOLYN) 2.5 MG tablet TAKE 1 TABLET  BY MOUTH TWICE DAILY    . olmesartan (BENICAR) 40 MG tablet TAKE 1 TABLET BY MOUTH ONCE A DAY. 30 tablet 0  . sertraline (ZOLOFT) 50 MG tablet Take 1 tablet (50 mg total) by mouth daily. 30 tablet 5  . sevelamer carbonate (RENVELA) 800 MG tablet Take 800 mg by mouth 3 (three) times daily with meals.   6  . torsemide (DEMADEX) 20 MG tablet TAKE (2) TABLETS BY MOUTH ONCE DAILY AS NEEDED (EDEMA OR WEIGHT GAIN;3 POUNDS IN A DAY OR 5 POUNDS IN A WEEK). 30 tablet 5  . torsemide (DEMADEX) 20 MG tablet TAKE 3 TABLETS BY MOUTH IN THE MORNING.    Marland Kitchen ULTICARE MINI PEN NEEDLES 31G X 6 MM MISC USE AS DIRECTED 100 each 10   No current facility-administered medications for this visit.     REVIEW OF SYSTEMS:  [X]  denotes positive finding, [ ]  denotes negative finding Cardiac  Comments:  Chest pain or chest pressure:    Shortness of breath upon exertion:    Short of breath when lying flat:    Irregular heart rhythm:        Vascular    Pain in calf, thigh, or hip brought on by ambulation: x   Pain in feet at night that wakes you up from your sleep:  x   Blood clot in your veins:    Leg swelling:  x         PHYSICAL EXAM: Vitals:   04/18/18 1330  BP: (!) 160/71  Pulse: (!) 104  Resp: 18  Temp: 98 F (36.7 C)  TempSrc: Temporal  Weight: 211 lb 12.8 oz (96.1 kg)  Height: 5\' 4"  (1.626 m)    GENERAL: The patient is a well-nourished female, in no acute distress. The vital signs are documented above. CARDIOVASCULAR: Palpable left radial pulse with scar over her prior radiocephalic fistula attempt.  She does have a patent left upper arm brachiocephalic AV fistula.  There is some bruising and obvious infiltration of blood under the surface of the skin.  The vein does appear to run deep to the surface of the skin PULMONARY: There is good air exchange  MUSCULOSKELETAL: There are no major deformities or cyanosis. NEUROLOGIC: No focal weakness or paresthesias are detected. SKIN: There are no ulcers or  rashes noted. PSYCHIATRIC: The patient has a normal affect.  DATA:  She does not have a formal venous duplex.  I did image her vein with SonoSite ultrasound.  This does show some blood around midportion of the fistula.  She does have several large side branches.  It appears that the main issue is regarding the depth of the fistula.  The vein diameter itself is very nice and adequate.  The vein does run deep to the surface of the skin under the subcutaneous fat.  MEDICAL ISSUES: I had a long discussion with the patient and her son present.  I do not feel that she will have adequate access of her fistula due to the depth.  I have recommended superficial translocation of her cephalic vein fistula.  I do feel that she has a very good chance that this will be functional for her since she is developed a nice size diameter of the fistula.  She is questioning the need for hemodialysis access since she is contemplating peritoneal dialysis.  I explained that we would defer this decision to Dr. Lowanda Foster.  We will tentatively schedule her for left upper arm translocation in approximately 2 weeks to allow for resolution of the subcutaneous blood.  If it is felt that this is not necessary we will cancel    Rosetta Posner, MD [facility] Vascular and Vein Specialists of [facility] Tel [phone] Pager [phone]

## 2018-04-18 NOTE — H&P (View-Only) (Signed)
Vascular and Vein Specialist of [facility] office  Patient name: Kathryn Beck MRN: [id number] DOB: 1955-10-29 Sex: female  REASON FOR VISIT: Valuation left arm AV fistula  HPI: Kathryn Beck is a 63 y.o. female here today for evaluation of her left arm AV fistula.  She initially had a left radiocephalic fistula which failed to mature.  She subsequently underwent left brachiocephalic fistula in June 6948.  She was seen in our office in August 2019 and it was felt that this was maturing nicely.  She has subsequently initiated hemodialysis initially was undergoing hemodialysis via her catheter.  She has had several attempts at use of her fistula.  She reports that she had adequate dialysis for her first treatment but then has had infiltration and difficulty clotting the machine.  Does report that she is being evaluated for peritoneal dialysis but has not had a catheter placed yet.  Past Medical History:  Diagnosis Date  . Acid reflux   . Anemia   . Arthritis   . Axillary masses    Soft tissue - status post excision  . Back pain   . CKD (chronic kidney disease) stage 5, GFR less than 15 ml/min (HCC)   . Diastolic heart failure ([place])   . Essential hypertension   . Ischemic heart disease    Abnormal Myoview April 2018 - medical therapy  . Mixed hyperlipidemia   . Obesity   . Pancreatitis   . Sleep apnea    Noncompliant with CPAP  . Type 2 diabetes mellitus, uncontrolled (HCC)     Family History  Problem Relation Age of Onset  . Hypertension Father   . Hypercholesterolemia Father   . Arthritis Father   . Hypertension Sister   . Hypercholesterolemia Sister   . Breast cancer Sister   . Hypertension Sister   . Colon cancer Neg Hx   . Colon polyps Neg Hx     SOCIAL HISTORY: Social History   Tobacco Use  . Smoking status: Never Smoker  . Smokeless tobacco: Never Used  Substance Use Topics  . Alcohol use: No    Allergies   Allergen Reactions  . Ace Inhibitors Anaphylaxis and Swelling  . Penicillins Itching, Swelling and Other (See Comments)    SWELLING REACTION UNSPECIFIED   PATIENT HAS HAD A PCN REACTION WITH IMMEDIATE RASH, FACIAL/TONGUE/THROAT SWELLING, SOB, OR LIGHTHEADEDNESS WITH HYPOTENSION:  #  #  YES  #  #  Has patient had a PCN reaction causing severe rash involving mucus membranes or skin necrosis: No Has patient had a PCN reaction that required hospitalization No Has patient had a PCN reaction occurring within the last 10 years: No If all of the above answers are "NO", then may proceed with Cephalosporin use.   . Statins Other (See Comments)    elevated LFT's  . Albuterol Swelling    SWELLING REACTION UNSPECIFIED     Current Outpatient Medications  Medication Sig Dispense Refill  . acetaminophen (TYLENOL) 500 MG tablet Take by mouth.    Marland Kitchen amLODipine (NORVASC) 10 MG tablet TAKE 1 TABLET BY MOUTH ONCE DAILY. 30 tablet 5  . aspirin EC 81 MG tablet Take 1 tablet (81 mg total) by mouth daily. 90 tablet 1  . camphor-menthol (SARNA) lotion Apply 1 application topically every 8 (eight) hours as needed for itching. 222 mL 0  . cloNIDine (CATAPRES) 0.3 MG tablet TAKE (1) TABLET BY MOUTH THREE TIMES DAILY 84 tablet 4  . dexlansoprazole (DEXILANT) 60  MG capsule TAKE 1 CAPSULE IN THE MORNING WITH FOOD 28 capsule 10  . ezetimibe (ZETIA) 10 MG tablet Take 1 tablet (10 mg total) by mouth daily. 30 tablet 5  . gabapentin (NEURONTIN) 100 MG capsule TAKE (1) CAPSULE BY MOUTH TWICE DAILY. 56 capsule 10  . Insulin Detemir (LEVEMIR FLEXTOUCH) 100 UNIT/ML Pen INJECT 50 UNITS INTO THE SKIN TWICE DAILY 90 mL 3  . insulin lispro (HUMALOG KWIKPEN) 100 UNIT/ML KiwkPen INJECT 36-40 units 3x a day before meals 90 mL 3  . isosorbide mononitrate (IMDUR) 30 MG 24 hr tablet Take 1 tablet (30 mg total) by mouth daily. 30 tablet 5  . metolazone (ZAROXOLYN) 2.5 MG tablet   0  . metolazone (ZAROXOLYN) 2.5 MG tablet TAKE 1 TABLET  BY MOUTH TWICE DAILY    . olmesartan (BENICAR) 40 MG tablet TAKE 1 TABLET BY MOUTH ONCE A DAY. 30 tablet 0  . sertraline (ZOLOFT) 50 MG tablet Take 1 tablet (50 mg total) by mouth daily. 30 tablet 5  . sevelamer carbonate (RENVELA) 800 MG tablet Take 800 mg by mouth 3 (three) times daily with meals.   6  . torsemide (DEMADEX) 20 MG tablet TAKE (2) TABLETS BY MOUTH ONCE DAILY AS NEEDED (EDEMA OR WEIGHT GAIN;3 POUNDS IN A DAY OR 5 POUNDS IN A WEEK). 30 tablet 5  . torsemide (DEMADEX) 20 MG tablet TAKE 3 TABLETS BY MOUTH IN THE MORNING.    Marland Kitchen ULTICARE MINI PEN NEEDLES 31G X 6 MM MISC USE AS DIRECTED 100 each 10   No current facility-administered medications for this visit.     REVIEW OF SYSTEMS:  [X]  denotes positive finding, [ ]  denotes negative finding Cardiac  Comments:  Chest pain or chest pressure:    Shortness of breath upon exertion:    Short of breath when lying flat:    Irregular heart rhythm:        Vascular    Pain in calf, thigh, or hip brought on by ambulation: x   Pain in feet at night that wakes you up from your sleep:  x   Blood clot in your veins:    Leg swelling:  x         PHYSICAL EXAM: Vitals:   04/18/18 1330  BP: (!) 160/71  Pulse: (!) 104  Resp: 18  Temp: 98 F (36.7 C)  TempSrc: Temporal  Weight: 211 lb 12.8 oz (96.1 kg)  Height: 5\' 4"  (1.626 m)    GENERAL: The patient is a well-nourished female, in no acute distress. The vital signs are documented above. CARDIOVASCULAR: Palpable left radial pulse with scar over her prior radiocephalic fistula attempt.  She does have a patent left upper arm brachiocephalic AV fistula.  There is some bruising and obvious infiltration of blood under the surface of the skin.  The vein does appear to run deep to the surface of the skin PULMONARY: There is good air exchange  MUSCULOSKELETAL: There are no major deformities or cyanosis. NEUROLOGIC: No focal weakness or paresthesias are detected. SKIN: There are no ulcers or  rashes noted. PSYCHIATRIC: The patient has a normal affect.  DATA:  She does not have a formal venous duplex.  I did image her vein with SonoSite ultrasound.  This does show some blood around midportion of the fistula.  She does have several large side branches.  It appears that the main issue is regarding the depth of the fistula.  The vein diameter itself is very nice and adequate.  The vein does run deep to the surface of the skin under the subcutaneous fat.  MEDICAL ISSUES: I had a long discussion with the patient and her son present.  I do not feel that she will have adequate access of her fistula due to the depth.  I have recommended superficial translocation of her cephalic vein fistula.  I do feel that she has a very good chance that this will be functional for her since she is developed a nice size diameter of the fistula.  She is questioning the need for hemodialysis access since she is contemplating peritoneal dialysis.  I explained that we would defer this decision to Dr. Lowanda Foster.  We will tentatively schedule her for left upper arm translocation in approximately 2 weeks to allow for resolution of the subcutaneous blood.  If it is felt that this is not necessary we will cancel    Rosetta Posner, MD [facility] Vascular and Vein Specialists of [facility] Tel [phone] Pager [phone]

## 2018-04-19 ENCOUNTER — Ambulatory Visit (HOSPITAL_COMMUNITY): Payer: Self-pay

## 2018-04-19 ENCOUNTER — Other Ambulatory Visit (HOSPITAL_COMMUNITY): Payer: Self-pay

## 2018-04-20 ENCOUNTER — Ambulatory Visit: Payer: Self-pay | Admitting: Family Medicine

## 2018-04-20 ENCOUNTER — Other Ambulatory Visit: Payer: Self-pay | Admitting: *Deleted

## 2018-04-20 ENCOUNTER — Telehealth: Payer: Self-pay | Admitting: *Deleted

## 2018-04-20 NOTE — Telephone Encounter (Signed)
Phone call to patient's daughter with pre-op instructions. To be at [facility] admitting department at 6:30 am on 05/04/2018 for surgery. NPO past MN night prior. Must have a driver and caregiver for discharge. Expect a call and follow the detailed surgery instructions received from the hospital pre-admission department. Verbalized understanding.

## 2018-04-22 ENCOUNTER — Telehealth: Payer: Self-pay

## 2018-04-22 ENCOUNTER — Encounter (HOSPITAL_COMMUNITY): Payer: Self-pay

## 2018-04-22 ENCOUNTER — Other Ambulatory Visit: Payer: Self-pay

## 2018-04-22 ENCOUNTER — Emergency Department (HOSPITAL_COMMUNITY)
Admission: EM | Admit: 2018-04-22 | Discharge: 2018-04-22 | Disposition: A | Discharge status: Home / Self Care | Payer: 59 | Attending: Emergency Medicine | Admitting: Emergency Medicine

## 2018-04-22 ENCOUNTER — Ambulatory Visit (HOSPITAL_COMMUNITY)
Admission: RE | Admit: 2018-04-22 | Discharge: 2018-04-22 | Disposition: A | Discharge status: Home / Self Care | Payer: 59 | Source: Ambulatory Visit | Attending: Orthopedic Surgery | Admitting: Orthopedic Surgery

## 2018-04-22 ENCOUNTER — Emergency Department (HOSPITAL_COMMUNITY): Payer: 59

## 2018-04-22 DIAGNOSIS — N185 Chronic kidney disease, stage 5: Secondary | ICD-10-CM | POA: Diagnosis not present

## 2018-04-22 DIAGNOSIS — M79672 Pain in left foot: Secondary | ICD-10-CM | POA: Insufficient documentation

## 2018-04-22 DIAGNOSIS — M545 Low back pain, unspecified: Secondary | ICD-10-CM

## 2018-04-22 DIAGNOSIS — E785 Hyperlipidemia, unspecified: Secondary | ICD-10-CM | POA: Diagnosis not present

## 2018-04-22 DIAGNOSIS — M109 Gout, unspecified: Secondary | ICD-10-CM | POA: Insufficient documentation

## 2018-04-22 DIAGNOSIS — I120 Hypertensive chronic kidney disease with stage 5 chronic kidney disease or end stage renal disease: Secondary | ICD-10-CM | POA: Diagnosis not present

## 2018-04-22 DIAGNOSIS — Z79899 Other long term (current) drug therapy: Secondary | ICD-10-CM | POA: Diagnosis not present

## 2018-04-22 DIAGNOSIS — M79605 Pain in left leg: Secondary | ICD-10-CM

## 2018-04-22 DIAGNOSIS — E119 Type 2 diabetes mellitus without complications: Secondary | ICD-10-CM | POA: Diagnosis not present

## 2018-04-22 DIAGNOSIS — M79671 Pain in right foot: Secondary | ICD-10-CM | POA: Insufficient documentation

## 2018-04-22 LAB — BASIC METABOLIC PANEL
ANION GAP: 14 (ref 5–15)
BUN: 16 mg/dL (ref 8–23)
CO2: 25 mmol/L (ref 22–32)
Calcium: 9.2 mg/dL (ref 8.9–10.3)
Chloride: 101 mmol/L (ref 98–111)
Creatinine, Ser: 3.49 mg/dL — ABNORMAL HIGH (ref 0.44–1.00)
GFR calc Af Amer: 15 mL/min — ABNORMAL LOW (ref >60)
GFR calc non Af Amer: 13 mL/min — ABNORMAL LOW (ref >60)
Glucose, Bld: 201 mg/dL — ABNORMAL HIGH (ref 70–99)
Potassium: 3.3 mmol/L — ABNORMAL LOW (ref 3.5–5.1)
Sodium: 140 mmol/L (ref 135–145)

## 2018-04-22 LAB — CBC WITH DIFFERENTIAL/PLATELET
Abs Immature Granulocytes: 0.07 10*3/uL (ref 0.00–0.07)
Basophils Absolute: 0 10*3/uL (ref 0.0–0.1)
Basophils Relative: 0 %
Eosinophils Absolute: 0.1 10*3/uL (ref 0.0–0.5)
Eosinophils Relative: 1 %
HCT: 29.1 % — ABNORMAL LOW (ref 36.0–46.0)
Hemoglobin: 8.7 g/dL — ABNORMAL LOW (ref 12.0–15.0)
IMMATURE GRANULOCYTES: 1 %
Lymphocytes Relative: 18 %
Lymphs Abs: 1.7 10*3/uL (ref 0.7–4.0)
MCH: 23.1 pg — ABNORMAL LOW (ref 26.0–34.0)
MCHC: 29.9 g/dL — ABNORMAL LOW (ref 30.0–36.0)
MCV: 77.2 fL — ABNORMAL LOW (ref 80.0–100.0)
Monocytes Absolute: 1.1 10*3/uL — ABNORMAL HIGH (ref 0.1–1.0)
Monocytes Relative: 11 %
Neutro Abs: 6.6 10*3/uL (ref 1.7–7.7)
Neutrophils Relative %: 69 %
Platelets: 380 10*3/uL (ref 150–400)
RBC: 3.77 MIL/uL — ABNORMAL LOW (ref 3.87–5.11)
RDW: 18.7 % — ABNORMAL HIGH (ref 11.5–15.5)
WBC: 9.6 10*3/uL (ref 4.0–10.5)
nRBC: 0.2 % (ref 0.0–0.2)

## 2018-04-22 LAB — URIC ACID: Uric Acid, Serum: 6.2 mg/dL (ref 2.5–7.1)

## 2018-04-22 MED ORDER — TRAMADOL HCL 50 MG PO TABS: 50.0000 mg | ORAL_TABLET | Freq: Four times a day (QID) | ORAL | 0 refills | Status: DC | PRN

## 2018-04-22 NOTE — Discharge Instructions (Signed)
Schedule to see Dr. Moshe Cipro for recheck.

## 2018-04-22 NOTE — ED Triage Notes (Signed)
Pt reports swelling in feet and lower legs.  Reports improves after dialysis but says they stay sore.  Pt had dialysis treatment yesterday.

## 2018-04-22 NOTE — Telephone Encounter (Signed)
[facility] - Left Message

## 2018-04-22 NOTE — BH Specialist Note (Signed)
[place] Telephone Follow-up  MRN: [id number] NAME: Kathryn Beck Date: 04/22/18   Total time: 30 minutes Call number: 2/6  Reason for call today: Reason for Contact: PHQ9-2 weeks  PHQ-9 Scores:  Depression screen [facility] 2/9 04/22/2018 03/08/2018 03/08/2018 02/10/2018 02/10/2018  Decreased Interest 2 3 2 1  0  Down, Depressed, Hopeless 2 3 1 1  0  PHQ - 2 Score 4 6 3 2  0  Altered sleeping 3 2 1  0 1  Tired, decreased energy 1 3 1 1 1   Change in appetite 0 3 1 0 1  Feeling bad or failure about yourself  2 2 0 0 0  Trouble concentrating 0 3 0 0 0  Moving slowly or fidgety/restless 0 1 0 0 0  Suicidal thoughts 0 0 0 0 0  PHQ-9 Score 10 20 6 3 3   Difficult doing work/chores Somewhat difficult - Somewhat difficult Not difficult at all Somewhat difficult  Some recent data might be hidden    GAD-7 Scores:  GAD 7 : Generalized Anxiety Score 04/22/2018 03/08/2018  Nervous, Anxious, on Edge 1 1  Control/stop worrying 1 1  Worry too much - different things 1 0  Trouble relaxing 1 1  Restless 1 0  Easily annoyed or irritable 0 0  Afraid - awful might happen 0 2  Total GAD 7 Score 5 5  Anxiety Difficulty Somewhat difficult Somewhat difficult    Stress Current stressors: Current Stressors: (Increased anxietym lack of sleep) Sleep: Sleep: Decreased, Difficulty staying asleep, Difficulty falling asleep Appetite: Appetite: Decreased, Loss of appetite Coping ability: Coping ability: Exhausted, Overwhelmed  Patient taking medications as prescribed: Patient taking medications as prescribed: Yes  Current medications:  Outpatient Encounter Medications as of 04/07/2018  Medication Sig  . acetaminophen (TYLENOL) 500 MG tablet Take by mouth.  Marland Kitchen amLODipine (NORVASC) 10 MG tablet TAKE 1 TABLET BY MOUTH ONCE DAILY.  Marland Kitchen aspirin EC 81 MG tablet Take 1 tablet (81 mg total) by mouth daily.  . camphor-menthol (SARNA) lotion Apply 1 application topically every 8 (eight) hours as needed for  itching.  Marland Kitchen dexlansoprazole (DEXILANT) 60 MG capsule TAKE 1 CAPSULE IN THE MORNING WITH FOOD  . ezetimibe (ZETIA) 10 MG tablet Take 1 tablet (10 mg total) by mouth daily.  Marland Kitchen gabapentin (NEURONTIN) 100 MG capsule TAKE (1) CAPSULE BY MOUTH TWICE DAILY.  Marland Kitchen Insulin Detemir (LEVEMIR FLEXTOUCH) 100 UNIT/ML Pen INJECT 50 UNITS INTO THE SKIN TWICE DAILY  . insulin lispro (HUMALOG KWIKPEN) 100 UNIT/ML KiwkPen INJECT 36-40 units 3x a day before meals  . isosorbide mononitrate (IMDUR) 30 MG 24 hr tablet Take 1 tablet (30 mg total) by mouth daily.  . metolazone (ZAROXOLYN) 2.5 MG tablet   . metolazone (ZAROXOLYN) 2.5 MG tablet TAKE 1 TABLET BY MOUTH TWICE DAILY  . sertraline (ZOLOFT) 50 MG tablet Take 1 tablet (50 mg total) by mouth daily.  . sevelamer carbonate (RENVELA) 800 MG tablet Take 800 mg by mouth 3 (three) times daily with meals.   . torsemide (DEMADEX) 20 MG tablet TAKE (2) TABLETS BY MOUTH ONCE DAILY AS NEEDED (EDEMA OR WEIGHT GAIN;3 POUNDS IN A DAY OR 5 POUNDS IN A WEEK).  . torsemide (DEMADEX) 20 MG tablet TAKE 3 TABLETS BY MOUTH IN THE MORNING.  Marland Kitchen ULTICARE MINI PEN NEEDLES 31G X 6 MM MISC USE AS DIRECTED  . [DISCONTINUED] cloNIDine (CATAPRES) 0.3 MG tablet Take 1 tablet (0.3 mg total) by mouth 3 (three) times daily.  . [DISCONTINUED] olmesartan (BENICAR) 40  MG tablet TAKE 1 TABLET BY MOUTH ONCE A DAY.  . [DISCONTINUED] sevelamer (RENAGEL) 800 MG tablet Take by mouth.   No facility-administered encounter medications on file as of 04/07/2018.      Self-harm Behaviors Risk Assessment Self-harm risk factors: Self-harm risk factors: (None Reported) Patient endorses recent thoughts of harming self: Have you recently had any thoughts about harming yourself?: No  [place] Suicide Severity Rating Scale: No flowsheet data found. C-SRSS 03/08/2018 06-May-2018  1. Wish to be Dead No No  2. Suicidal Thoughts No No  6. Suicide Behavior Question No No    Danger to Others Risk Assessment Danger to  others risk factors: Danger to Others Risk Factors: No risk factors noted Patient endorses recent thoughts of harming others: Notification required: No need or identified person    Substance Use Assessment Patient recently consumed alcohol:  None Reported  Alcohol Use Disorder Identification Test (AUDIT):  Alcohol Use Disorder Test (AUDIT) 03/08/2018  1. How often do you have a drink containing alcohol? 0  2. How many drinks containing alcohol do you have on a typical day when you are drinking? 0  3. How often do you have six or more drinks on one occasion? 0  AUDIT-C Score 0  Alcohol Brief Interventions/Follow-up AUDIT Score <7 follow-up not indicated   Patient recently used drugs:  None Reported     Goals, Interventions and Follow-up Plan Goals: Increase healthy adjustment to current life circumstances Interventions: Behavioral Activation and Supportive Counseling Follow-up Plan: [facility] Phone Follow UP:  Summary:   Follow UP:   Patient is a 63 year old female.  Patient has a decrease in her PHQ and GAD score.   Stressor:  Patient reports that she has not been sleeping well due to anxiety associated with her blood clotting during her dialysis session.  Patient started her first round of dialysis treatment in January 2020.  Patient reports that her mind continues to race at night and she cannot stop all of her intrusive thoughts about what can go wrong during dialysis.        Brief Interventions/Supportive Counseling:  Patient will place a journal at her night stand and write down what she is feeling.  Patient will incorporate positive self-talk regarding her current medical situation.     Medication:  Patient reports compliance with taking her psychiatric medication.  Patient reports that her medication is working for her.   Patient denies any negative side effects with her psychiatric medication.   Patient denies SI/HI/Psychosis/Substance Abuse. If your symptoms worsen or you have  thoughts of suicide/homicide, PLEASE SEEK IMMEDIATE MEDICAL ATTENTION.  You may always call:  National Suicide Hotline: (808)403-8110;   Crisis Line: 903-624-3091;  Crisis Recovery in [place]: [phone].  These are available 24 hours a day, 7 days a week.  During the next session the writer follow up the patient journaling, irrational thought process and meditation and medication compliance.     Graciella Freer [place], LCAS-A

## 2018-04-23 NOTE — ED Provider Notes (Signed)
[facility] EMERGENCY DEPARTMENT Provider Note   CSN: [id number] Arrival date & time: 04/22/18  0841     History   Chief Complaint Chief Complaint  Patient presents with  . Foot Swelling    HPI Kathryn Beck is a 63 y.o. female.  The history is provided by the patient. No language interpreter was used.  Leg Pain  Location:  Foot Injury: no   Foot location:  L foot and R foot Pain details:    Quality:  Aching   Radiates to:  Does not radiate   Severity:  Moderate   Onset quality:  Gradual   Timing:  Constant Chronicity:  Recurrent Prior injury to area:  No Relieved by:  Nothing Worsened by:  Nothing   Past Medical History:  Diagnosis Date  . Acid reflux   . Anemia   . Arthritis   . Axillary masses    Soft tissue - status post excision  . Back pain   . CKD (chronic kidney disease) stage 5, GFR less than 15 ml/min (HCC)   . Diastolic heart failure ([place])   . Essential hypertension   . Ischemic heart disease    Abnormal Myoview April 2018 - medical therapy  . Mixed hyperlipidemia   . Obesity   . Pancreatitis   . Sleep apnea    Noncompliant with CPAP  . Type 2 diabetes mellitus, uncontrolled ([place])     Patient Active Problem List   Diagnosis Date Noted  . Depression, major, single episode, severe ([place]) 03/09/2018  . Pneumonia 02/10/2018  . Adrenal mass, left ([place]) 11/09/2017  . MGUS (monoclonal gammopathy of unknown significance) 11/05/2017  . Chronic diastolic CHF (congestive heart failure) ([place]) 09/20/2017  . Bilateral leg weakness 09/09/2017  . Adrenal adenoma 09/03/2017  . Shortness of breath   . Uncontrolled type 2 diabetes mellitus with nephropathy ([place]) 08/18/2017  . Unsteady gait 07/11/2017  . Recurrent falls 07/11/2017  . Anemia of chronic disease 03/24/2017  . History of colonic polyps 02/10/2017  . Dysphagia 11/05/2016  . IBS (irritable bowel syndrome) 02/04/2016  . Postprandial epigastric pain   . Heat sensitivity 10/07/2014  .  Cardiac murmur 01/17/2014  . Encounter for examination following treatment at hospital 01/15/2014  . Back pain with left-sided radiculopathy 09/18/2013  . Hyperuricemia 08/04/2013  . Seasonal allergies 03/10/2013  . Lipoma of back 12/22/2012  . GERD (gastroesophageal reflux disease) 08/22/2012  . Acute maxillary sinusitis 12/21/2011  . Sleep apnea 06/02/2011  . CKD (chronic kidney disease) stage 4, GFR 15-29 ml/min (HCC) 01/13/2011  . FATIGUE 07/24/2009  . LIVER FUNCTION TESTS, ABNORMAL, HX OF 05/08/2009  . LUPUS ERYTHEMATOSUS, DISCOID 06/05/2008  . Arm pain 05/30/2008  . Type 2 diabetes mellitus with stage 4 chronic kidney disease, with long-term current use of insulin ([place]) 06/07/2007  . Mixed hyperlipidemia 06/07/2007  . Morbid obesity ([place]) 06/07/2007  . Malignant hypertension 06/07/2007    Past Surgical History:  Procedure Laterality Date  . ABDOMINAL HYSTERECTOMY    . AV FISTULA PLACEMENT Left 09/02/2017   Procedure: creation of left arm ARTERIOVENOUS (AV) FISTULA;  Surgeon: Serafina Mitchell, MD;  Location: [facility] OR;  Service: Vascular;  Laterality: Left;  . COLONOSCOPY  2008   Dr. Oneida Alar: normal   . COLONOSCOPY N/A 12/18/2016   Dr. Oneida Alar: multiple tubular adenomas, internal hemorrhoids. Surveillance in 3 years   . ESOPHAGEAL DILATION N/A 10/13/2015   Procedure: ESOPHAGEAL DILATION;  Surgeon: Rogene Houston, MD;  [facility];  Service:  Endoscopy;  Laterality: N/A;  . ESOPHAGOGASTRODUODENOSCOPY N/A 10/13/2015   Dr. Laural Golden: chronic gastritis on path, no H.pylori. Empiric dilation   . ESOPHAGOGASTRODUODENOSCOPY N/A 12/18/2016   Dr. Oneida Alar: mild gastritis. BRAVO study revealed uncontrolled GERD. Dysphagia secondary to uncontrolled reflux  . FOOT SURGERY Bilateral   . LUNG BIOPSY    . MASS EXCISION Right 01/09/2013   Procedure: EXCISION OF NEOPLASM OF RIGHT  AXILLA  AND EXCISION OF NEOPLASM OF LEFT AXILLA;  Surgeon: Jamesetta So, MD;  Location: [facility] ORS;  Service:  General;  Laterality: Right;  procedure end @ 08:23  . MYRINGOTOMY WITH TUBE PLACEMENT Bilateral 04/28/2017   Procedure: BILATERAL MYRINGOTOMY WITH TUBE PLACEMENT;  Surgeon: Leta Baptist, MD;  Location: [facility];  Service: ENT;  Laterality: Bilateral;  . SAVORY DILATION N/A 12/18/2016   Procedure: SAVORY DILATION;  Surgeon: Danie Binder, MD;  Location[facility] ENDO [facility];  Service: Endoscopy;  Laterality: N/A;     OB History   No obstetric history on file.      Home Medications    Prior to Admission medications   Medication Sig Start Date End Date Taking? Authorizing Provider  acetaminophen (TYLENOL) 500 MG tablet Take by mouth.    [provider]  amLODipine (NORVASC) 10 MG tablet TAKE 1 TABLET BY MOUTH ONCE DAILY. 03/14/18   Fayrene Helper, MD  aspirin EC 81 MG tablet Take 1 tablet (81 mg total) by mouth daily. 01/03/18   Satira Sark, MD  camphor-menthol [facility]) lotion Apply 1 application topically every 8 (eight) hours as needed for itching. 09/08/17   Roxan Hockey, MD  cloNIDine (CATAPRES) 0.3 MG tablet TAKE (1) TABLET BY MOUTH THREE TIMES DAILY 04/11/18   Fayrene Helper, MD  dexlansoprazole (DEXILANT) 60 MG capsule TAKE 1 CAPSULE IN THE MORNING WITH FOOD 04/07/18   Fayrene Helper, MD  ezetimibe (ZETIA) 10 MG tablet Take 1 tablet (10 mg total) by mouth daily. 11/29/17   Fayrene Helper, MD  gabapentin (NEURONTIN) 100 MG capsule TAKE (1) CAPSULE BY MOUTH TWICE DAILY. 04/07/18   Fayrene Helper, MD  Insulin Detemir (LEVEMIR FLEXTOUCH) 100 UNIT/ML Pen INJECT 50 UNITS INTO THE SKIN TWICE DAILY 03/14/18   [name], MD  insulin lispro (HUMALOG KWIKPEN) 100 UNIT/ML KiwkPen INJECT 36-40 units 3x a day before meals 11/30/17   [name], MD  isosorbide mononitrate (IMDUR) 30 MG 24 hr tablet Take 1 tablet (30 mg total) by mouth daily. 11/29/17   Fayrene Helper, MD  metolazone (ZAROXOLYN) 2.5 MG tablet  02/15/18   [provider]  metolazone  (ZAROXOLYN) 2.5 MG tablet TAKE 1 TABLET BY MOUTH TWICE DAILY 03/11/18   [provider]  olmesartan (BENICAR) 40 MG tablet TAKE 1 TABLET BY MOUTH ONCE A DAY. 04/13/18   Fayrene Helper, MD  sertraline (ZOLOFT) 50 MG tablet Take 1 tablet (50 mg total) by mouth daily. 03/08/18   Fayrene Helper, MD  sevelamer carbonate (RENVELA) 800 MG tablet Take 800 mg by mouth 3 (three) times daily with meals.  11/30/17   [provider]  torsemide (DEMADEX) 20 MG tablet TAKE (2) TABLETS BY MOUTH ONCE DAILY AS NEEDED (EDEMA OR WEIGHT GAIN;3 POUNDS IN A DAY OR 5 POUNDS IN A WEEK). 11/24/17   Fayrene Helper, MD  torsemide (DEMADEX) 20 MG tablet TAKE 3 TABLETS BY MOUTH IN THE MORNING. 03/11/18   [provider]  traMADol (ULTRAM) 50 MG tablet Take 1 tablet (50 mg total) by  mouth every 6 (six) hours as needed. 04/22/18 04/22/19  Fransico Meadow, PA-C  ULTICARE MINI PEN NEEDLES 31G X 6 MM MISC USE AS DIRECTED 04/07/18   Fayrene Helper, MD  FLUoxetine (PROZAC) 10 MG capsule Take 10 mg by mouth daily.    05/28/11  [provider]  glipiZIDE (GLUCOTROL) 10 MG tablet Take 10 mg by mouth 2 (two) times daily before a meal.    05/28/11  [provider]    Family History Family History  Problem Relation Age of Onset  . Hypertension Father   . Hypercholesterolemia Father   . Arthritis Father   . Hypertension Sister   . Hypercholesterolemia Sister   . Breast cancer Sister   . Hypertension Sister   . Colon cancer Neg Hx   . Colon polyps Neg Hx     Social History Social History   Tobacco Use  . Smoking status: Never Smoker  . Smokeless tobacco: Never Used  Substance Use Topics  . Alcohol use: No  . Drug use: No     Allergies   Ace inhibitors; Penicillins; Statins; and Albuterol   Review of Systems Review of Systems  All other systems reviewed and are negative.    Physical Exam Updated Vital Signs BP (!) 158/70   Pulse (!) 108   Temp 98.7 F (37.1  C) (Oral)   Resp 20   Ht 5\' 4"  (1.626 m)   Wt 94.3 kg   SpO2 98%   BMI 35.70 kg/m   Physical Exam Vitals signs reviewed.  HENT:     Head: Normocephalic.     Mouth/Throat:     Mouth: Mucous membranes are moist.  Neck:     Musculoskeletal: Normal range of motion.  Cardiovascular:     Rate and Rhythm: Normal rate.  Pulmonary:     Effort: Pulmonary effort is normal.  Abdominal:     General: Abdomen is flat.  Musculoskeletal:        General: Swelling and tenderness present.     Comments: Slight swelling in feet,    Skin:    General: Skin is warm.  Neurological:     General: No focal deficit present.  Psychiatric:        Mood and Affect: Mood normal.      ED Treatments / Results  Labs (all labs ordered are listed, but only abnormal results are displayed) Labs Reviewed  CBC WITH DIFFERENTIAL/PLATELET - Abnormal; Notable for the following components:      Result Value   RBC 3.77 (*)    Hemoglobin 8.7 (*)    HCT 29.1 (*)    MCV 77.2 (*)    MCH 23.1 (*)    MCHC 29.9 (*)    RDW 18.7 (*)    Monocytes Absolute 1.1 (*)    All other components within normal limits  BASIC METABOLIC PANEL - Abnormal; Notable for the following components:   Potassium 3.3 (*)    Glucose, Bld 201 (*)    Creatinine, Ser 3.49 (*)    GFR calc non Af Amer 13 (*)    GFR calc Af Amer 15 (*)    All other components within normal limits  URIC ACID    EKG None  Radiology Dg Chest 2 View  Result Date: 04/22/2018 CLINICAL DATA:  Leg and feet pain.  Feet swelling. EXAM: CHEST - 2 VIEW COMPARISON:  03/07/2018 FINDINGS: Heart size and pulmonary vascularity are normal. No acute infiltrates or effusions. Chronic scarring at the  left lung base with chronic blunting of the left costophrenic angle. Central venous catheter in place with the tip at the cavoatrial junction. IMPRESSION: No active cardiopulmonary disease. Electronically Signed   By: Lorriane Shire M.D.   On: 04/22/2018 10:06   Mr Lumbar  Spine Wo Contrast  Result Date: 04/22/2018 CLINICAL DATA:  Chronic low back pain with weakness and numbness and difficulty walking. EXAM: MRI LUMBAR SPINE WITHOUT CONTRAST TECHNIQUE: Multiplanar, multisequence MR imaging of the lumbar spine was performed. No intravenous contrast was administered. 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 Alignment:  Physiologic. Vertebrae:  No fracture, evidence of discitis, or bone lesion. Conus medullaris and cauda equina: Conus extends to the L1-2 level. Conus and cauda equina appear normal. Paraspinal and other soft tissues: Stable 19 mm nodule in the left adrenal gland, unchanged since 2015, 19 mm otherwise negative. Disc levels: T12-L1: Negative. L1-2: Negative. L2-3: Negative. L3-4: Small broad-based disc bulge without neural impingement. Focal slight arthritic changes of the left facet joint. Widely patent neural foramina. No spinal stenosis. L4-5: Small broad-based disc bulge with no neural impingement. No foraminal or spinal stenosis. L5-S1: Vestigial disc. No neural impingement. 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 IMPRESSION: No significant abnormality of the lumbar spine. Focal slight facet arthritis at L3-4 to the left. 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 Electronically Signed   By: Lorriane Shire M.D.   On: 04/22/2018 10:13    Procedures Procedures (including critical care time)  Medications Ordered in ED Medications - No data to display   Initial Impression / Assessment and Plan / ED Course  I have reviewed the triage vital signs and the nursing notes.  Pertinent labs & imaging results that were available during my care of the patient were reviewed by me and considered in my medical decision making (see chart for details).     MDM  Pt has a history of gout.  Uric acid is normal .   Pt counseled on anemia.  Pt advised to elevate feet.  Pt  given tramadol for pain   Final Clinical Impressions(s) / ED Diagnoses   Final diagnoses:  Bilateral foot pain    ED Discharge Orders         Ordered    traMADol (ULTRAM) 50 MG tablet  Every 6 hours PRN     04/22/18 1022        An After Visit Summary was printed and given to the patient.    Fransico Meadow, [place] 04/23/18 3710    Elnora Morrison, MD 04/23/18 [id number]

## 2018-05-02 ENCOUNTER — Encounter (HOSPITAL_COMMUNITY): Payer: Self-pay | Admitting: *Deleted

## 2018-05-02 ENCOUNTER — Telehealth: Payer: Self-pay

## 2018-05-02 ENCOUNTER — Other Ambulatory Visit: Payer: Self-pay

## 2018-05-02 NOTE — Telephone Encounter (Signed)
Transition Care Management Follow-up Telephone Call   Date discharged? 04-30-18            How have you been since you were released from the hospital? "Im better, still having this arthritis but I'm ok"   Do you understand why you were in the hospital? Yes, my blood was low and I had to get some blood    Do you understand the discharge instructions? Yes   Where were you discharged to? Home   Items Reviewed:  Medications reviewed: Yes   Allergies reviewed: Yes   Dietary changes reviewed: Yes low sodium heart healthy   Referrals reviewed: Yes    Functional Questionnaire:   Activities of Daily Living (ADLs):   Sister has been staying with her and helping her   Any transportation issues/concerns?: No    Any patient concerns? Just my appointment to see Dr. Moshe Cipro, need to make one on a day I don't have dialysis    Confirmed importance and date/time of follow-up visits scheduled Yes 05-11-18 at 2:20      Confirmed with patient if condition begins to worsen call PCP or go to the ER.  Patient was given the office number and encouraged to call back with question or concerns.  : Yes

## 2018-05-02 NOTE — Telephone Encounter (Signed)
Pt's daughter called to confirm pt could still have surgery this week, as she had been in hospital for neuropathy and is having difficulty walking. She is using a wheelchair and a 2 person assist, per daughter. I explained to her pt can still have surgery. No further questions at this time.

## 2018-05-03 ENCOUNTER — Ambulatory Visit (HOSPITAL_COMMUNITY): Payer: Self-pay | Admitting: Hematology

## 2018-05-03 ENCOUNTER — Other Ambulatory Visit (HOSPITAL_COMMUNITY): Payer: Self-pay

## 2018-05-03 ENCOUNTER — Ambulatory Visit (HOSPITAL_COMMUNITY): Payer: Self-pay

## 2018-05-03 ENCOUNTER — Telehealth: Payer: Self-pay

## 2018-05-03 DIAGNOSIS — F331 Major depressive disorder, recurrent, moderate: Secondary | ICD-10-CM

## 2018-05-03 NOTE — Progress Notes (Signed)
Mrs. Flewellen denies chest pain or shortness of breath.  Patient has type II diabetes. Mrs. Fesler ask me to speak to her daughter Ms Dara Lords to review medications and tell her what medications patient should take. I gave instructions for patient to take 1/2 of Levemir tonight, 25 units.  I instructed that if patient 's CBG >  70 that patient should take 25 units of Levemir. I instructed patient to check CBG after awaking and every 2 hours until arrival  to the hospital.  I Instructed patient if CBG is less than 70 to drink  1/2 cup of a clear juice. Recheck CBG in 15 minutes then call pre- op desk at [phone] for further instructions. If scheduled to receive Insulin, do not take Insulin.

## 2018-05-03 NOTE — Progress Notes (Deleted)
[place], [place] [address]   CLINIC:  Medical Oncology/Hematology  PCP:  Fayrene Helper, MD [address] [place] [address] [phone]   REASON FOR VISIT: Follow-up for MGUS AND anemia due to chronic kidney disease  CURRENT THERAPY: Observation AND procrit weekly and intermittent iron infusions.    INTERVAL HISTORY:  Kathryn Beck 63 y.o. female returns for routine follow-up for MGUS and anemia due to chronic kidney disease.   REVIEW OF SYSTEMS:  Review of Systems - Oncology   PAST MEDICAL/SURGICAL HISTORY:  Past Medical History:  Diagnosis Date  . Acid reflux   . Anemia   . Anxiety    clausterophoic  . Arthritis   . Axillary masses    Soft tissue - status post excision  . Back pain   . CHF (congestive heart failure) ([place])   . Depression   . Diastolic heart failure ([place])   . End-stage renal disease ([place])    TTHSat  in [place]  . Essential hypertension   . History of blood transfusion   . Ischemic heart disease    Abnormal Myoview April 2018 - medical therapy  . Mixed hyperlipidemia   . Obesity   . Pancreatitis   . Pneumonia 2019   two times in 2019  . Sleep apnea    Noncompliant with CPAP  . Type 2 diabetes mellitus, uncontrolled ([place])    Type II   Past Surgical History:  Procedure Laterality Date  . ABDOMINAL HYSTERECTOMY    . AV FISTULA PLACEMENT Left 09/02/2017   Procedure: creation of left arm ARTERIOVENOUS (AV) FISTULA;  Surgeon: Serafina Mitchell, MD;  Location: [facility] OR;  Service: Vascular;  Laterality: Left;  . COLONOSCOPY  2008   Dr. Oneida Alar: normal   . COLONOSCOPY N/A 12/18/2016   Dr. Oneida Alar: multiple tubular adenomas, internal hemorrhoids. Surveillance in 3 years   . ESOPHAGEAL DILATION N/A 10/13/2015   Procedure: ESOPHAGEAL DILATION;  Surgeon: Rogene Houston, MD;  [facility];  Service: Endoscopy;  Laterality: N/A;  . ESOPHAGOGASTRODUODENOSCOPY N/A 10/13/2015   Dr.  Laural Golden: chronic gastritis on path, no H.pylori. Empiric dilation   . ESOPHAGOGASTRODUODENOSCOPY N/A 12/18/2016   Dr. Oneida Alar: mild gastritis. BRAVO study revealed uncontrolled GERD. Dysphagia secondary to uncontrolled reflux  . FOOT SURGERY Bilateral    "nerve"    . LUNG BIOPSY    . MASS EXCISION Right 01/09/2013   Procedure: EXCISION OF NEOPLASM OF RIGHT  AXILLA  AND EXCISION OF NEOPLASM OF LEFT AXILLA;  Surgeon: Jamesetta So, MD;  Location: [facility] OR[facility];  Service: General;  Laterality: Right;  procedure end @ 08:23  . MYRINGOTOMY WITH TUBE PLACEMENT Bilateral 04/28/2017   Procedure: BILATERAL MYRINGOTOMY WITH TUBE PLACEMENT;  Surgeon: Leta Baptist, MD;  Location: [facility];  Service: ENT;  Laterality: Bilateral;  . SAVORY DILATION N/A 12/18/2016   Procedure: SAVORY DILATION;  Surgeon: Danie Binder, MD;  [facility];  Service: Endoscopy;  Laterality: N/A;     SOCIAL HISTORY:  Social History   Socioeconomic History  . Marital status: Married    Spouse name: larry   . Number of children: 2  . Years of education: 47  . Highest education level: 12th grade  Occupational History  . Occupation: retired   Scientific laboratory technician  . Financial resource strain: Somewhat hard  . Food insecurity:    Worry: Never true    Inability: Never true  . Transportation needs:  Medical: No    Non-medical: No  Tobacco Use  . Smoking status: Never Smoker  . Smokeless tobacco: Never Used  Substance and Sexual Activity  . Alcohol use: No  . Drug use: No  . Sexual activity: Not Currently  Lifestyle  . Physical activity:    Days per week: 0 days    Minutes per session: 0 min  . Stress: Only a little  Relationships  . Social connections:    Talks on phone: More than three times a week    Gets together: More than three times a week    Attends religious service: Never    Active member of club or organization: No    Attends meetings of clubs or organizations: Never    Relationship status: Married  .  Intimate partner violence:    Fear of current or ex partner: No    Emotionally abused: No    Physically abused: No    Forced sexual activity: No  Other Topics Concern  . Not on file  Social History Narrative   Lives alone with husband     FAMILY HISTORY:  Family History  Problem Relation Age of Onset  . Hypertension Father   . Hypercholesterolemia Father   . Arthritis Father   . Hypertension Sister   . Hypercholesterolemia Sister   . Breast cancer Sister   . Hypertension Sister   . Colon cancer Neg Hx   . Colon polyps Neg Hx     CURRENT MEDICATIONS:  Outpatient Encounter Medications as of 05/03/2018  Medication Sig  . acetaminophen (TYLENOL) 500 MG tablet Take by mouth.  Marland Kitchen amLODipine (NORVASC) 10 MG tablet TAKE 1 TABLET BY MOUTH ONCE DAILY.  Marland Kitchen aspirin EC 81 MG tablet Take 1 tablet (81 mg total) by mouth daily.  . camphor-menthol (SARNA) lotion Apply 1 application topically every 8 (eight) hours as needed for itching.  . cloNIDine (CATAPRES) 0.3 MG tablet TAKE (1) TABLET BY MOUTH THREE TIMES DAILY  . dexlansoprazole (DEXILANT) 60 MG capsule TAKE 1 CAPSULE IN THE MORNING WITH FOOD  . ezetimibe (ZETIA) 10 MG tablet Take 1 tablet (10 mg total) by mouth daily.  Marland Kitchen gabapentin (NEURONTIN) 100 MG capsule TAKE (1) CAPSULE BY MOUTH TWICE DAILY. (Patient not taking: Reported on 05/02/2018)  . gabapentin (NEURONTIN) 300 MG capsule Take 300 mg by mouth 3 (three) times daily.  . Insulin Detemir (LEVEMIR FLEXTOUCH) 100 UNIT/ML Pen INJECT 50 UNITS INTO THE SKIN TWICE DAILY  . insulin lispro (HUMALOG KWIKPEN) 100 UNIT/ML KiwkPen INJECT 36-40 units 3x a day before meals  . isosorbide mononitrate (IMDUR) 30 MG 24 hr tablet Take 1 tablet (30 mg total) by mouth daily.  . metolazone (ZAROXOLYN) 2.5 MG tablet   . metolazone (ZAROXOLYN) 2.5 MG tablet TAKE 1 TABLET BY MOUTH TWICE DAILY  . olmesartan (BENICAR) 40 MG tablet TAKE 1 TABLET BY MOUTH ONCE A DAY.  . sertraline (ZOLOFT) 50 MG tablet Take 1  tablet (50 mg total) by mouth daily.  . sevelamer carbonate (RENVELA) 800 MG tablet Take 800 mg by mouth 3 (three) times daily with meals.   . torsemide (DEMADEX) 20 MG tablet TAKE (2) TABLETS BY MOUTH ONCE DAILY AS NEEDED (EDEMA OR WEIGHT GAIN;3 POUNDS IN A DAY OR 5 POUNDS IN A WEEK).  . traMADol (ULTRAM) 50 MG tablet Take 1 tablet (50 mg total) by mouth every 6 (six) hours as needed.  Marland Kitchen ULTICARE MINI PEN NEEDLES 31G X 6 MM MISC USE AS DIRECTED  . [  DISCONTINUED] FLUoxetine (PROZAC) 10 MG capsule Take 10 mg by mouth daily.    . [DISCONTINUED] glipiZIDE (GLUCOTROL) 10 MG tablet Take 10 mg by mouth 2 (two) times daily before a meal.     No facility-administered encounter medications on file as of 05/03/2018.     ALLERGIES:  Allergies  Allergen Reactions  . Ace Inhibitors Anaphylaxis and Swelling  . Penicillins Itching, Swelling and Other (See Comments)    Did it involve swelling of the face/tongue/throat, SOB, or low BP? Unknown Did it involve sudden or severe rash/hives, skin peeling, or any reaction on the inside of your mouth or nose? Unknown Did you need to seek medical attention at a hospital or doctor's office? Unknown When did it last happen?years  If all above answers are "NO", may proceed with cephalosporin use.   . Statins Other (See Comments)    elevated LFT's  . Albuterol Swelling     PHYSICAL EXAM:  ECOG Performance status: ***  There were no vitals filed for this visit. There were no vitals filed for this visit.  Physical Exam   LABORATORY DATA:  I have reviewed the labs as listed.  CBC    Component Value Date/Time   WBC 9.6 04/22/2018 0916   RBC 3.77 (L) 04/22/2018 0916   HGB 8.7 (L) 04/22/2018 0916   HCT 29.1 (L) 04/22/2018 0916   PLT 380 04/22/2018 0916   MCV 77.2 (L) 04/22/2018 0916   MCH 23.1 (L) 04/22/2018 0916   MCHC 29.9 (L) 04/22/2018 0916   RDW 18.7 (H) 04/22/2018 0916   LYMPHSABS 1.7 04/22/2018 0916   MONOABS 1.1 (H) 04/22/2018 0916    EOSABS 0.1 04/22/2018 0916   BASOSABS 0.0 04/22/2018 0916   CMP Latest Ref Rng & Units 04/22/2018 03/08/2018 02/06/2018  Glucose 70 - 99 mg/dL 201(H) - 70  BUN 8 - 23 mg/dL 16 - 78(H)  Creatinine 0.44 - 1.00 mg/dL 3.49(H) - 4.69(H)  Sodium 135 - 145 mmol/L 140 - 141  Potassium 3.5 - 5.1 mmol/L 3.3(L) - 5.0  Chloride 98 - 111 mmol/L 101 - 113(H)  CO2 22 - 32 mmol/L 25 - 21(L)  Calcium 8.9 - 10.3 mg/dL 9.2 - 9.0  Total Protein 6.5 - 8.1 g/dL - 7.1 -  Total Bilirubin 0.3 - 1.2 mg/dL - 0.5 -  Alkaline Phos 38 - 126 U/L - 93 -  AST 15 - 41 U/L - 22 -  ALT 0 - 44 U/L - 33 -       DIAGNOSTIC IMAGING:  I have independently reviewed the scans and discussed with the patient.   I have reviewed Francene Finders, NP's note and agree with the documentation.  I personally performed a face-to-face visit, made revisions and my assessment and plan is as follows.    ASSESSMENT & PLAN:   No problem-specific Assessment & Plan notes found for this encounter.      Orders placed this encounter:  No orders of the defined types were placed in this encounter.     Derek Jack, MD [place] [phone]

## 2018-05-03 NOTE — BH Specialist Note (Signed)
[place] Telephone Follow-up  MRN: [id number] NAME: Kathryn Beck Date: 05/03/18   Total time: 30 minutes Call number: 3/6  Reason for call today: Reason for Contact: PHQ9-4 weeks    PHQ-9 Scores:  Depression screen [facility] 2/9 05/03/2018 04/22/2018 03/08/2018 03/08/2018 02/10/2018  Decreased Interest 1 2 3 2 1   Down, Depressed, Hopeless 1 2 3 1 1   PHQ - 2 Score 2 4 6 3 2   Altered sleeping 1 3 2 1  0  Tired, decreased energy 1 1 3 1 1   Change in appetite 0 0 3 1 0  Feeling bad or failure about yourself  1 2 2  0 0  Trouble concentrating 0 0 3 0 0  Moving slowly or fidgety/restless 0 0 1 0 0  Suicidal thoughts 0 0 0 0 0  PHQ-9 Score 5 10 20 6 3   Difficult doing work/chores Not difficult at all Somewhat difficult - Somewhat difficult Not difficult at all  Some recent data might be hidden    GAD-7 Scores:  GAD 7 : Generalized Anxiety Score 05/03/2018 04/22/2018 03/08/2018  Nervous, Anxious, on Edge 1 1 1   Control/stop worrying 1 1 1   Worry too much - different things 0 1 0  Trouble relaxing 1 1 1   Restless 0 1 0  Easily annoyed or irritable 0 0 0  Afraid - awful might happen 1 0 2  Total GAD 7 Score 4 5 5   Anxiety Difficulty Not difficult at all Somewhat difficult Somewhat difficult    Stress Current stressors: Current Stressors: (Recently discharged from the hospital due to medical condition ) Sleep: Sleep: Decreased, Difficulty falling asleep Appetite: Appetite: Decreased, Loss of appetite Coping ability: Coping ability: Exhausted, Overwhelmed Patient taking medications as prescribed: Patient taking medications as prescribed: Yes    Current medications:  Outpatient Encounter Medications as of 05/03/2018  Medication Sig  . acetaminophen (TYLENOL) 500 MG tablet Take by mouth.  Marland Kitchen amLODipine (NORVASC) 10 MG tablet TAKE 1 TABLET BY MOUTH ONCE DAILY.  Marland Kitchen aspirin EC 81 MG tablet Take 1 tablet (81 mg total) by mouth daily.  . camphor-menthol (SARNA) lotion Apply 1  application topically every 8 (eight) hours as needed for itching.  . cloNIDine (CATAPRES) 0.3 MG tablet TAKE (1) TABLET BY MOUTH THREE TIMES DAILY  . dexlansoprazole (DEXILANT) 60 MG capsule TAKE 1 CAPSULE IN THE MORNING WITH FOOD  . ezetimibe (ZETIA) 10 MG tablet Take 1 tablet (10 mg total) by mouth daily.  Marland Kitchen gabapentin (NEURONTIN) 100 MG capsule TAKE (1) CAPSULE BY MOUTH TWICE DAILY. (Patient not taking: Reported on 05/02/2018)  . gabapentin (NEURONTIN) 300 MG capsule Take 300 mg by mouth 3 (three) times daily.  . Insulin Detemir (LEVEMIR FLEXTOUCH) 100 UNIT/ML Pen INJECT 50 UNITS INTO THE SKIN TWICE DAILY  . insulin lispro (HUMALOG KWIKPEN) 100 UNIT/ML KiwkPen INJECT 36-40 units 3x a day before meals  . isosorbide mononitrate (IMDUR) 30 MG 24 hr tablet Take 1 tablet (30 mg total) by mouth daily.  . metolazone (ZAROXOLYN) 2.5 MG tablet   . metolazone (ZAROXOLYN) 2.5 MG tablet TAKE 1 TABLET BY MOUTH TWICE DAILY  . olmesartan (BENICAR) 40 MG tablet TAKE 1 TABLET BY MOUTH ONCE A DAY.  . sertraline (ZOLOFT) 50 MG tablet Take 1 tablet (50 mg total) by mouth daily.  . sevelamer carbonate (RENVELA) 800 MG tablet Take 800 mg by mouth 3 (three) times daily with meals.   . torsemide (DEMADEX) 20 MG tablet TAKE (2) TABLETS BY MOUTH ONCE  DAILY AS NEEDED (EDEMA OR WEIGHT GAIN;3 POUNDS IN A DAY OR 5 POUNDS IN A WEEK).  . traMADol (ULTRAM) 50 MG tablet Take 1 tablet (50 mg total) by mouth every 6 (six) hours as needed.  Marland Kitchen ULTICARE MINI PEN NEEDLES 31G X 6 MM MISC USE AS DIRECTED  . [DISCONTINUED] FLUoxetine (PROZAC) 10 MG capsule Take 10 mg by mouth daily.    . [DISCONTINUED] glipiZIDE (GLUCOTROL) 10 MG tablet Take 10 mg by mouth 2 (two) times daily before a meal.     No facility-administered encounter medications on file as of May 26, 2018.      Self-harm Behaviors Risk Assessment Self-harm risk factors: Self-harm risk factors: (None Reported) Patient endorses recent thoughts of harming self: Have you  recently had any thoughts about harming yourself?: No    [place] Suicide Severity Rating Scale: No flowsheet data found. C-SRSS 03/08/2018 04/22/2018 May 26, 2018  1. Wish to be Dead No No No  2. Suicidal Thoughts No No No  6. Suicide Behavior Question No No No     Danger to Others Risk Assessment Danger to others risk factors: Danger to Others Risk Factors: No risk factors noted Patient endorses recent thoughts of harming others: Notification required: No need or identified person    Substance Use Assessment Patient recently consumed alcohol:  NA  Alcohol Use Disorder Identification Test (AUDIT):  Alcohol Use Disorder Test (AUDIT) 03/08/2018  1. How often do you have a drink containing alcohol? 0  2. How many drinks containing alcohol do you have on a typical day when you are drinking? 0  3. How often do you have six or more drinks on one occasion? 0  AUDIT-C Score 0  Alcohol Brief Interventions/Follow-up AUDIT Score <7 follow-up not indicated   Patient recently used drugs:  None Reported    Goals, Interventions and Follow-up Plan Goals: Increase healthy adjustment to current life circumstances Interventions: Supportive Counseling and Sleep Hygiene Follow-up Plan: [facility] Phone Follow UP   Summary:   Follow UP:   Patient is a 63 year old female.  Patient has a decrease in her PHQ and GAD score.   Brief Interventions/Supportive Counseling:  Probation officer discussed patient feeling regarding being recently discharged from the hospital due to medical complications.  Writer praised the patient for prating mindfulness and focusing on no more blood clotting when she is at dialysis.  Patient reports that she would like to incorporate exercise into her daily routine as a means of stress reduction and a way to maintain her health.  Writer praised patient for attempting to l incorporate positive self-talk regarding her current medical situation.   Patient feels hopeful because she has an upcoming  phone appointment for her disability claim.   Medication:  Patient reports compliance with taking her psychiatric medication.  Patient reports that her medication is working for her.   Patient denies any negative side effects with her psychiatric medication.    During the next session:  Patient will continue to incorporate sleep hygiene techniques.  Patient will continue to utilize mindfulness techniques in order to stop intrusive thoughts about what could have gone wrong during her dialysis.  Patient reports that she did not place a journal at her night stand and write down what she is feeling due to being hospitalized.  Patient will continue to follow work on positive self-talk regarding her current medical condition    Patient denies SI/HI/Psychosis/Substance Abuse. If your symptoms worsen or you have thoughts of suicide/homicide, PLEASE SEEK IMMEDIATE MEDICAL ATTENTION.  You  may always call:  National Suicide Hotline: (779)058-0418;  [place] Crisis Line: 437 415 4313;  C[facility]is [facility] in [place]: 330 553 1610.  These are available 24 hours a day, 7 days a week.             Graciella Freer [place], LCAS-A

## 2018-05-03 NOTE — Anesthesia Preprocedure Evaluation (Addendum)
Anesthesia Evaluation  Patient identified by MRN, date of birth, ID band Patient awake    Reviewed: Allergy & Precautions, NPO status , Patient's Chart, lab work & pertinent test results  Airway Mallampati: IV  TM Distance: >3 FB Neck ROM: Full    Dental  (+) Dental Advisory Given   Pulmonary sleep apnea ,    breath sounds clear to auscultation       Cardiovascular hypertension, Pt. on medications +CHF   Rhythm:Regular Rate:Normal  06/2016 Myoview. Abnormal scan with moderate periinfarct ischemia. Continued medical therapy in light of ESRD per Cards notes.  Normal LVEF. Valves okay   Neuro/Psych negative neurological ROS     GI/Hepatic Neg liver ROS, GERD  ,  Endo/Other  diabetes, Type 2, Insulin Dependent  Renal/GU ESRF and DialysisRenal disease     Musculoskeletal  (+) Arthritis ,   Abdominal   Peds  Hematology  (+) anemia ,   Anesthesia Other Findings   Reproductive/Obstetrics                           Lab Results  Component Value Date   WBC 9.6 04/22/2018   HGB 9.9 (L) 05/04/2018   HCT 29.0 (L) 05/04/2018   MCV 77.2 (L) 04/22/2018   PLT 380 04/22/2018   Lab Results  Component Value Date   CREATININE 3.49 (H) 04/22/2018   BUN 16 04/22/2018   NA 138 05/04/2018   K 3.8 05/04/2018   CL 101 04/22/2018   CO2 25 04/22/2018    Anesthesia Physical Anesthesia Plan  ASA: IV  Anesthesia Plan: MAC   Post-op Pain Management:    Induction: Intravenous  PONV Risk Score and Plan: 2 and Propofol infusion, Ondansetron and Treatment may vary due to age or medical condition  Airway Management Planned: Simple Face Mask and Natural Airway  Additional Equipment:   Intra-op Plan:   Post-operative Plan:   Informed Consent: I have reviewed the patients History and Physical, chart, labs and discussed the procedure including the risks, benefits and alternatives for the proposed  anesthesia with the patient or authorized representative who has indicated his/her understanding and acceptance.       Plan Discussed with: CRNA  Anesthesia Plan Comments:       Anesthesia Quick Evaluation

## 2018-05-04 ENCOUNTER — Ambulatory Visit (HOSPITAL_COMMUNITY)
Admission: RE | Admit: 2018-05-04 | Discharge: 2018-05-04 | Disposition: A | Discharge status: Home / Self Care | Payer: 59 | Attending: Vascular Surgery | Admitting: Vascular Surgery

## 2018-05-04 ENCOUNTER — Ambulatory Visit (HOSPITAL_COMMUNITY): Payer: 59 | Admitting: Anesthesiology

## 2018-05-04 ENCOUNTER — Encounter (HOSPITAL_COMMUNITY): Payer: Self-pay | Admitting: Certified Registered Nurse Anesthetist

## 2018-05-04 ENCOUNTER — Other Ambulatory Visit: Payer: Self-pay

## 2018-05-04 ENCOUNTER — Encounter (HOSPITAL_COMMUNITY)
Admission: RE | Disposition: A | Discharge status: Home / Self Care | Payer: Self-pay | Source: Home / Self Care | Attending: Vascular Surgery

## 2018-05-04 DIAGNOSIS — Z880 Allergy status to penicillin: Secondary | ICD-10-CM | POA: Insufficient documentation

## 2018-05-04 DIAGNOSIS — E782 Mixed hyperlipidemia: Secondary | ICD-10-CM | POA: Diagnosis not present

## 2018-05-04 DIAGNOSIS — E669 Obesity, unspecified: Secondary | ICD-10-CM | POA: Diagnosis not present

## 2018-05-04 DIAGNOSIS — G4730 Sleep apnea, unspecified: Secondary | ICD-10-CM | POA: Insufficient documentation

## 2018-05-04 DIAGNOSIS — I132 Hypertensive heart and chronic kidney disease with heart failure and with stage 5 chronic kidney disease, or end stage renal disease: Secondary | ICD-10-CM | POA: Insufficient documentation

## 2018-05-04 DIAGNOSIS — K219 Gastro-esophageal reflux disease without esophagitis: Secondary | ICD-10-CM | POA: Insufficient documentation

## 2018-05-04 DIAGNOSIS — Z888 Allergy status to other drugs, medicaments and biological substances status: Secondary | ICD-10-CM | POA: Diagnosis not present

## 2018-05-04 DIAGNOSIS — Z7982 Long term (current) use of aspirin: Secondary | ICD-10-CM | POA: Insufficient documentation

## 2018-05-04 DIAGNOSIS — Z9119 Patient's noncompliance with other medical treatment and regimen: Secondary | ICD-10-CM | POA: Insufficient documentation

## 2018-05-04 DIAGNOSIS — Z6831 Body mass index (BMI) 31.0-31.9, adult: Secondary | ICD-10-CM | POA: Diagnosis not present

## 2018-05-04 DIAGNOSIS — T82898A Other specified complication of vascular prosthetic devices, implants and grafts, initial encounter: Secondary | ICD-10-CM | POA: Diagnosis present

## 2018-05-04 DIAGNOSIS — Z79899 Other long term (current) drug therapy: Secondary | ICD-10-CM | POA: Insufficient documentation

## 2018-05-04 DIAGNOSIS — Z992 Dependence on renal dialysis: Secondary | ICD-10-CM | POA: Insufficient documentation

## 2018-05-04 DIAGNOSIS — I5032 Chronic diastolic (congestive) heart failure: Secondary | ICD-10-CM | POA: Diagnosis not present

## 2018-05-04 DIAGNOSIS — E1122 Type 2 diabetes mellitus with diabetic chronic kidney disease: Secondary | ICD-10-CM | POA: Diagnosis not present

## 2018-05-04 DIAGNOSIS — Z794 Long term (current) use of insulin: Secondary | ICD-10-CM | POA: Insufficient documentation

## 2018-05-04 DIAGNOSIS — Y832 Surgical operation with anastomosis, bypass or graft as the cause of abnormal reaction of the patient, or of later complication, without mention of misadventure at the time of the procedure: Secondary | ICD-10-CM | POA: Diagnosis not present

## 2018-05-04 DIAGNOSIS — N186 End stage renal disease: Secondary | ICD-10-CM | POA: Diagnosis not present

## 2018-05-04 DIAGNOSIS — N185 Chronic kidney disease, stage 5: Secondary | ICD-10-CM

## 2018-05-04 HISTORY — DX: Anxiety disorder, unspecified: F41.9

## 2018-05-04 HISTORY — DX: Personal history of other medical treatment: Z92.89

## 2018-05-04 HISTORY — DX: Depression, unspecified: F32.A

## 2018-05-04 HISTORY — DX: End stage renal disease: N18.6

## 2018-05-04 HISTORY — DX: Major depressive disorder, single episode, unspecified: F32.9

## 2018-05-04 HISTORY — DX: Pneumonia, unspecified organism: J18.9

## 2018-05-04 HISTORY — DX: Heart failure, unspecified: I50.9

## 2018-05-04 HISTORY — PX: REVISION OF ARTERIOVENOUS GORETEX GRAFT: SHX6073

## 2018-05-04 LAB — POCT I-STAT 4, (NA,K, GLUC, HGB,HCT)
Glucose, Bld: 170 mg/dL — ABNORMAL HIGH (ref 70–99)
HCT: 29 % — ABNORMAL LOW (ref 36.0–46.0)
Hemoglobin: 9.9 g/dL — ABNORMAL LOW (ref 12.0–15.0)
Potassium: 3.8 mmol/L (ref 3.5–5.1)
Sodium: 138 mmol/L (ref 135–145)

## 2018-05-04 LAB — GLUCOSE, CAPILLARY
Glucose-Capillary: 164 mg/dL — ABNORMAL HIGH (ref 70–99)
Glucose-Capillary: 154 mg/dL — ABNORMAL HIGH (ref 70–99)

## 2018-05-04 SURGERY — REVISION OF ARTERIOVENOUS GORETEX GRAFT
Anesthesia: Monitor Anesthesia Care | Site: Arm Upper | Laterality: Left

## 2018-05-04 MED ORDER — LIDOCAINE HCL (CARDIAC) PF 100 MG/5ML IV SOSY
PREFILLED_SYRINGE | INTRAVENOUS | Status: DC | PRN
  Administered 2018-05-04: 40 mg via INTRAVENOUS

## 2018-05-04 MED ORDER — VANCOMYCIN HCL IN DEXTROSE 1-5 GM/200ML-% IV SOLN
1000.0000 mg | INTRAVENOUS | Status: AC
  Administered 2018-05-04: 1000 mg via INTRAVENOUS
  Filled 2018-05-04: qty 200

## 2018-05-04 MED ORDER — SODIUM CHLORIDE 0.9 % IV SOLN
INTRAVENOUS | Status: AC
  Filled 2018-05-04: qty 1.2

## 2018-05-04 MED ORDER — PROPOFOL 10 MG/ML IV BOLUS
INTRAVENOUS | Status: AC
  Filled 2018-05-04: qty 20

## 2018-05-04 MED ORDER — SODIUM CHLORIDE 0.9 % IV SOLN
INTRAVENOUS | Status: DC
  Administered 2018-05-04: 09:00:00 via INTRAVENOUS

## 2018-05-04 MED ORDER — CHLORHEXIDINE GLUCONATE 4 % EX LIQD: 60.0000 mL | Freq: Once | CUTANEOUS | Status: DC

## 2018-05-04 MED ORDER — ONDANSETRON HCL 4 MG/2ML IJ SOLN
INTRAMUSCULAR | Status: AC
  Filled 2018-05-04: qty 2

## 2018-05-04 MED ORDER — PROPOFOL 1000 MG/100ML IV EMUL
INTRAVENOUS | Status: AC
  Filled 2018-05-04: qty 200

## 2018-05-04 MED ORDER — TRAMADOL HCL 50 MG PO TABS: 50.0000 mg | ORAL_TABLET | Freq: Four times a day (QID) | ORAL | 0 refills | Status: DC | PRN

## 2018-05-04 MED ORDER — PROPOFOL 10 MG/ML IV BOLUS
INTRAVENOUS | Status: DC | PRN
  Administered 2018-05-04: 20 mg via INTRAVENOUS

## 2018-05-04 MED ORDER — LIDOCAINE-EPINEPHRINE 0.5 %-1:200000 IJ SOLN
INTRAMUSCULAR | Status: DC | PRN
  Administered 2018-05-04: 17 mL

## 2018-05-04 MED ORDER — FENTANYL CITRATE (PF) 100 MCG/2ML IJ SOLN: 25.0000 ug | INTRAMUSCULAR | Status: DC | PRN

## 2018-05-04 MED ORDER — ONDANSETRON HCL 4 MG/2ML IJ SOLN
INTRAMUSCULAR | Status: DC | PRN
  Administered 2018-05-04: 4 mg via INTRAVENOUS

## 2018-05-04 MED ORDER — FENTANYL CITRATE (PF) 250 MCG/5ML IJ SOLN
INTRAMUSCULAR | Status: AC
  Filled 2018-05-04: qty 5

## 2018-05-04 MED ORDER — LIDOCAINE-EPINEPHRINE 0.5 %-1:200000 IJ SOLN
INTRAMUSCULAR | Status: AC
  Filled 2018-05-04: qty 3

## 2018-05-04 MED ORDER — PROPOFOL 500 MG/50ML IV EMUL
INTRAVENOUS | Status: DC | PRN
  Administered 2018-05-04: 50 ug/kg/min via INTRAVENOUS

## 2018-05-04 MED ORDER — MIDAZOLAM HCL 2 MG/2ML IJ SOLN
INTRAMUSCULAR | Status: AC
  Filled 2018-05-04: qty 2

## 2018-05-04 MED ORDER — 0.9 % SODIUM CHLORIDE (POUR BTL) OPTIME
TOPICAL | Status: DC | PRN
  Administered 2018-05-04: 1000 mL

## 2018-05-04 MED ORDER — SODIUM CHLORIDE 0.9 % IV SOLN
INTRAVENOUS | Status: DC | PRN
  Administered 2018-05-04: 25 ug/min via INTRAVENOUS

## 2018-05-04 MED ORDER — MIDAZOLAM HCL 2 MG/2ML IJ SOLN
INTRAMUSCULAR | Status: DC | PRN
  Administered 2018-05-04: 1 mg via INTRAVENOUS

## 2018-05-04 MED ORDER — PHENYLEPHRINE 40 MCG/ML (10ML) SYRINGE FOR IV PUSH (FOR BLOOD PRESSURE SUPPORT)
PREFILLED_SYRINGE | INTRAVENOUS | Status: AC
  Filled 2018-05-04: qty 10

## 2018-05-04 MED ORDER — LIDOCAINE 2% (20 MG/ML) 5 ML SYRINGE
INTRAMUSCULAR | Status: AC
  Filled 2018-05-04: qty 5

## 2018-05-04 MED ORDER — SODIUM CHLORIDE 0.9 % IV SOLN
INTRAVENOUS | Status: DC | PRN
  Administered 2018-05-04: 500 mL

## 2018-05-04 MED ORDER — FENTANYL CITRATE (PF) 250 MCG/5ML IJ SOLN
INTRAMUSCULAR | Status: DC | PRN
  Administered 2018-05-04 (×2): 25 ug via INTRAVENOUS
  Administered 2018-05-04: 50 ug via INTRAVENOUS
  Administered 2018-05-04 (×3): 25 ug via INTRAVENOUS
  Administered 2018-05-04: 50 ug via INTRAVENOUS
  Administered 2018-05-04: 25 ug via INTRAVENOUS

## 2018-05-04 SURGICAL SUPPLY — 32 items
CANISTER SUCT 3000ML PPV (MISCELLANEOUS) ×2 IMPLANT
CANNULA VESSEL 3MM 2 BLNT TIP (CANNULA) ×2 IMPLANT
CLIP LIGATING EXTRA MED SLVR (CLIP) ×2 IMPLANT
CLIP LIGATING EXTRA SM BLUE (MISCELLANEOUS) ×2 IMPLANT
COVER WAND RF STERILE (DRAPES) ×2 IMPLANT
DECANTER SPIKE VIAL GLASS SM (MISCELLANEOUS) ×2 IMPLANT
DERMABOND ADVANCED (GAUZE/BANDAGES/DRESSINGS) ×1
DERMABOND ADVANCED .7 DNX12 (GAUZE/BANDAGES/DRESSINGS) ×1 IMPLANT
ELECT REM PT RETURN 9FT ADLT (ELECTROSURGICAL) ×2
ELECTRODE REM PT RTRN 9FT ADLT (ELECTROSURGICAL) ×1 IMPLANT
GLOVE BIO SURGEON STRL SZ7.5 (GLOVE) ×2 IMPLANT
GLOVE BIO SURGEON STRL SZ8 (GLOVE) ×2 IMPLANT
GLOVE BIOGEL PI IND STRL 6.5 (GLOVE) ×3 IMPLANT
GLOVE BIOGEL PI INDICATOR 6.5 (GLOVE) ×3
GLOVE ECLIPSE 6.5 STRL STRAW (GLOVE) ×4 IMPLANT
GLOVE SS BIOGEL STRL SZ 7.5 (GLOVE) ×1 IMPLANT
GLOVE SUPERSENSE BIOGEL SZ 7.5 (GLOVE) ×1
GOWN STRL REUS W/ TWL LRG LVL3 (GOWN DISPOSABLE) ×3 IMPLANT
GOWN STRL REUS W/TWL LRG LVL3 (GOWN DISPOSABLE) ×3
KIT BASIN OR (CUSTOM PROCEDURE TRAY) ×2 IMPLANT
KIT TURNOVER KIT B (KITS) ×2 IMPLANT
NEEDLE HYPO 25GX1X1/2 BEV (NEEDLE) ×2 IMPLANT
NS IRRIG 1000ML POUR BTL (IV SOLUTION) ×2 IMPLANT
PACK CV ACCESS (CUSTOM PROCEDURE TRAY) ×2 IMPLANT
PAD ARMBOARD 7.5X6 YLW CONV (MISCELLANEOUS) ×4 IMPLANT
SUT PROLENE 6 0 CC (SUTURE) ×4 IMPLANT
SUT SILK 2 0 PERMA HAND 18 BK (SUTURE) ×2 IMPLANT
SUT VIC AB 3-0 SH 27 (SUTURE) ×3
SUT VIC AB 3-0 SH 27X BRD (SUTURE) ×3 IMPLANT
TOWEL GREEN STERILE (TOWEL DISPOSABLE) ×2 IMPLANT
UNDERPAD 30X30 (UNDERPADS AND DIAPERS) ×2 IMPLANT
WATER STERILE IRR 1000ML POUR (IV SOLUTION) ×2 IMPLANT

## 2018-05-04 NOTE — Progress Notes (Signed)
Pharmacy tech called with no answer and also paged.  Waiting return call.

## 2018-05-04 NOTE — Anesthesia Postprocedure Evaluation (Signed)
Anesthesia Post Note  Patient: Kathryn Beck  Procedure(s) Performed: TRANSPOSITION OF CEPHALIC VEIN ARTERIOVENOUS FISTULA LEFT ARM (Left Arm Upper)     Patient location during evaluation: PACU Anesthesia Type: MAC Level of consciousness: awake and alert Pain management: pain level controlled Vital Signs Assessment: post-procedure vital signs reviewed and stable Respiratory status: spontaneous breathing, nonlabored ventilation, respiratory function stable and patient connected to nasal cannula oxygen Cardiovascular status: stable and blood pressure returned to baseline Postop Assessment: no apparent nausea or vomiting Anesthetic complications: no    Last Vitals:  Vitals:   05/04/18 1120 05/04/18 1130  BP: (!) 96/51 (!) 102/51  Pulse: 67 67  Resp: 11 16  Temp:    SpO2: 96% 97%    Last Pain:  Vitals:   05/04/18 1050  TempSrc:   PainSc: Tyler Deis

## 2018-05-04 NOTE — Progress Notes (Signed)
Improvement in PHQ9, GAD 7. No change in medication recommendation.

## 2018-05-04 NOTE — Discharge Instructions (Signed)
° °  [facility] ° °Discharge Instructions ° °AV Fistula or Graft Surgery for Dialysis Access ° °Please refer to the following instructions for your post-procedure care. Your surgeon or physician assistant will discuss any changes with you. ° °Activity ° °You may drive the day following your surgery, if you are comfortable and no longer taking prescription pain medication. Resume full activity as the soreness in your incision resolves. ° °Bathing/Showering ° °You may shower after you go home. Keep your incision dry for 48 hours. Do not soak in a bathtub, hot tub, or swim until the incision heals completely. You may not shower if you have a hemodialysis catheter. ° °Incision Care ° °Clean your incision with mild soap and water after 48 hours. Pat the area dry with a clean towel. You do not need a bandage unless otherwise instructed. Do not apply any ointments or creams to your incision. You may have skin glue on your incision. Do not peel it off. It will come off on its own in about one week. Your arm may swell a bit after surgery. To reduce swelling use pillows to elevate your arm so it is above your heart. Your doctor will tell you if you need to lightly wrap your arm with an ACE bandage. ° °Diet ° °Resume your normal diet. There are not special food restrictions following this procedure. In order to heal from your surgery, it is CRITICAL to get adequate nutrition. Your body requires vitamins, minerals, and protein. Vegetables are the best source of vitamins and minerals. Vegetables also provide the perfect balance of protein. Processed food has little nutritional value, so try to avoid this. ° °Medications ° °Resume taking all of your medications. If your incision is causing pain, you may take over-the counter pain relievers such as acetaminophen (Tylenol). If you were prescribed a stronger pain medication, please be aware these medications can cause nausea and constipation. Prevent  nausea by taking the medication with a snack or meal. Avoid constipation by drinking plenty of fluids and eating foods with high amount of fiber, such as fruits, vegetables, and grains. Do not take Tylenol if you are taking prescription pain medications. ° ° ° ° °Follow up °Your surgeon may want to see you in the office following your access surgery. If so, this will be arranged at the time of your surgery. ° °Please call us immediately for any of the following conditions: ° °Increased pain, redness, drainage (pus) from your incision site °Fever of 101 degrees or higher °Severe or worsening pain at your incision site °Hand pain or numbness. ° °Reduce your risk of vascular disease: ° °Stop smoking. If you would like help, call [facility] at 1-800-QUIT-NOW (1-800-784-8669) or [place] at 336-586-4000 ° °Manage your cholesterol °Maintain a desired weight °Control your diabetes °Keep your blood pressure down ° °Dialysis ° °It will take several weeks to several months for your new dialysis access to be ready for use. Your surgeon will determine when it is OK to use it. Your nephrologist will continue to direct your dialysis. You can continue to use your Permcath until your new access is ready for use. ° °If you have any questions, please call the office at [phone]. ° °

## 2018-05-04 NOTE — Op Note (Signed)
    OPERATIVE REPORT  DATE OF SURGERY: 05/04/2018  PATIENT: Kathryn Beck, 63 y.o. female MRN: [id number]  DOB: 1956/01/01  PRE-OPERATIVE DIAGNOSIS: End-stage renal disease with poorly functioning left upper arm brachiocephalic fistula  POST-OPERATIVE DIAGNOSIS:  Same  PROCEDURE: Transposition of the right cephalic vein brachial cephalic fistula  SURGEON:  Curt Jews, M.D.  PHYSICIAN ASSISTANT: Matt Eveland, PA-C  ANESTHESIA: Local with sedation  EBL: per anesthesia record  Total I/O In: 570 [P.O.:220; I.V.:350] Out: 50 [Blood:50]  BLOOD ADMINISTERED: none  DRAINS: none  SPECIMEN: none  COUNTS CORRECT:  YES  PATIENT DISPOSITION:  PACU - hemodynamically stable  PROCEDURE DETAILS: Patient was taken to the operating place evaluation with area the left arm prepped and draped in sterile fashion.  SonoSite ultrasound was used to visualize the cephalic vein from the antecubital space to the shoulder.  The vein was of large caliber but did run deep to the fat.  Incision was made over the prior brachiocephalic anastomosis and then 3 separate incisions over the mid upper arm and shoulder to expose the cephalic vein.  Tributary branches were ligated with 3 oh psych silk ties and divided.  The vein was mobilized circumferentially throughout the upper arm.  The vein was occluded near the arterial anastomosis and was transected.  The vein was of large caliber.  The vein was marked to reduce risk of twisting.  It was brought out through the upper shoulder incision.  The vein was gently dilated with heparinized saline.  Next a tunnel was created lateral to the vein harvest incisions.  The vein was brought through this tunnel taking care not to twist it.  The vein again was gently dilated.  The vein was cut to the appropriate length and was sewn end-to-end to the vein near the arterial anastomosis.  Clamps removed and excellent thrill was noted.  The wounds were irrigated with saline.   Hemostasis obtained after cautery.  Wounds were closed with 3-0 Vicryl in the subcutaneous and subcuticular tissue.  Sterile dressing was applied and the patient was taken taken to the recovery room in stable condition   Rosetta Posner, M.D., [facility] 05/04/2018 1:02 PM

## 2018-05-04 NOTE — Transfer of Care (Signed)
Immediate Anesthesia Transfer of Care Note  Patient: Kathryn Beck  Procedure(s) Performed: TRANSPOSITION OF CEPHALIC VEIN ARTERIOVENOUS FISTULA LEFT ARM (Left Arm Upper)  Patient Location: PACU  Anesthesia Type:MAC  Level of Consciousness: awake, alert , oriented, drowsy and patient cooperative  Airway & Oxygen Therapy: Patient Spontanous Breathing and Patient connected to face mask oxygen  Post-op Assessment: Report given to RN and Post -op Vital signs reviewed and stable  Post vital signs: Reviewed and stable  Last Vitals:  Vitals Value Taken Time  BP 100/55 05/04/2018 10:51 AM  Temp    Pulse 71 05/04/2018 10:51 AM  Resp 16 05/04/2018 10:51 AM  SpO2 100 % 05/04/2018 10:51 AM  Vitals shown include unvalidated device data.  Last Pain:  Vitals:   05/04/18 0755  TempSrc:   PainSc: 0-No pain      Patients Stated Pain Goal: 3 (38/17/71 1657)  Complications: No apparent anesthesia complications

## 2018-05-04 NOTE — Interval H&P Note (Signed)
History and Physical Interval Note:  05/04/2018 8:31 AM  Kathryn Beck  has presented today for surgery, with the diagnosis of HEMODIALYSIS ACCESS COMPLICATION OF FISTULA LEFT ARM  The various methods of treatment have been discussed with the patient and family. After consideration of risks, benefits and other options for treatment, the patient has consented to  Procedure(s): TRANSPOSITION OF CEPHALIC VEIN ARTERIOVENOUS FISTULA LEFT ARM (Left) as a surgical intervention .  The patient's history has been reviewed, patient examined, no change in status, stable for surgery.  I have reviewed the patient's chart and labs.  Questions were answered to the patient's satisfaction.     Curt Jews

## 2018-05-04 NOTE — Interval H&P Note (Signed)
History and Physical Interval Note:  05/04/2018 8:32 AM  Kathryn Beck  has presented today for surgery, with the diagnosis of HEMODIALYSIS ACCESS COMPLICATION OF FISTULA LEFT ARM  The various methods of treatment have been discussed with the patient and family. After consideration of risks, benefits and other options for treatment, the patient has consented to  Procedure(s): TRANSPOSITION OF CEPHALIC VEIN ARTERIOVENOUS FISTULA LEFT ARM (Left) as a surgical intervention .  The patient's history has been reviewed, patient examined, no change in status, stable for surgery.  I have reviewed the patient's chart and labs.  Questions were answered to the patient's satisfaction.     Curt Jews

## 2018-05-05 ENCOUNTER — Telehealth: Payer: Self-pay | Admitting: Family Medicine

## 2018-05-05 ENCOUNTER — Encounter (HOSPITAL_COMMUNITY): Payer: Self-pay | Admitting: Vascular Surgery

## 2018-05-05 ENCOUNTER — Telehealth: Payer: Self-pay | Admitting: Vascular Surgery

## 2018-05-05 ENCOUNTER — Ambulatory Visit: Payer: Self-pay | Admitting: Family Medicine

## 2018-05-05 NOTE — Telephone Encounter (Signed)
sch appt spk to pt mld ltr 06/06/2018 9am p/o MD

## 2018-05-05 NOTE — Telephone Encounter (Signed)
Called number and spoke with patient and she has a letter that she needs signed so her sister can move in and help her out. She will bring it by today

## 2018-05-05 NOTE — Telephone Encounter (Signed)
Kathryn Beck *---Pts daughter is calling wanting to speak with you -

## 2018-05-05 NOTE — Telephone Encounter (Signed)
-----   Message from Dagoberto Ligas, PA-C sent at 05/04/2018 10:40 AM EST -----  Can you schedule an appt for this pt with Dr. Donnetta Hutching in [place] in about 4 weeks.  PO L arm fistula translocation. Thanks, [facility]

## 2018-05-09 ENCOUNTER — Other Ambulatory Visit: Payer: Self-pay | Admitting: Family Medicine

## 2018-05-09 ENCOUNTER — Ambulatory Visit (INDEPENDENT_AMBULATORY_CARE_PROVIDER_SITE_OTHER): Payer: 59 | Admitting: Orthopedic Surgery

## 2018-05-09 ENCOUNTER — Encounter: Payer: Self-pay | Admitting: Orthopedic Surgery

## 2018-05-09 VITALS — BP 88/47 | HR 60 | Ht 64.0 in

## 2018-05-09 DIAGNOSIS — M1712 Unilateral primary osteoarthritis, left knee: Secondary | ICD-10-CM | POA: Diagnosis not present

## 2018-05-09 DIAGNOSIS — M545 Low back pain, unspecified: Secondary | ICD-10-CM

## 2018-05-09 DIAGNOSIS — M79605 Pain in left leg: Secondary | ICD-10-CM | POA: Diagnosis not present

## 2018-05-09 DIAGNOSIS — M1710 Unilateral primary osteoarthritis, unspecified knee: Secondary | ICD-10-CM

## 2018-05-09 DIAGNOSIS — M7552 Bursitis of left shoulder: Secondary | ICD-10-CM | POA: Diagnosis not present

## 2018-05-09 NOTE — Progress Notes (Signed)
FOLLOW UP VISIT : MRI RESULTS   Chief Complaint  Patient presents with  . Back Pain     HPI: The patient is here TO DISCUSS THE RESULTS OF MRI  Prior history  Patient said she had one episode where she was able to walk with just her cane but then the pain came back the weakness came back  She had an MRI  Report is below  HPI My entire left side hurts   Patient presents with a greater than 1 month history of pain on the left lower extremity starting in her left lower back left hip left leg left knee all the way down to her left foot.  She says the pain is severe.  It is constant.  It is worse with any activity it is better with rest it is associated with a tingling down the left leg and pain in throbbing in the left knee Review of Systems  Constitutional: Positive for malaise/fatigue. Negative for chills, fever and weight loss.  Gastrointestinal: Negative.   Genitourinary: Positive for flank pain.  Musculoskeletal: Positive for back pain and joint pain.  Neurological: Positive for tingling.    Review of Systems  Constitutional: Positive for malaise/fatigue. Negative for fever.  Neurological: Positive for weakness.     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   Alignment:  Physiologic.   Vertebrae:  No fracture, evidence of discitis, or bone lesion.   Conus medullaris and cauda equina: Conus extends to the L1-2 level. Conus and cauda equina appear normal.   Paraspinal and other soft tissues: Stable 19 mm nodule in the left adrenal gland, unchanged since 2015, 19 mm otherwise negative.   Disc levels:   T12-L1: Negative.   L1-2: Negative.   L2-3: Negative.   L3-4: Small broad-based disc bulge without neural impingement. Focal slight arthritic changes of the left facet joint. Widely  patent neural foramina. No spinal stenosis.   L4-5: Small broad-based disc bulge with no neural impingement. No foraminal or spinal stenosis.   L5-S1: Vestigial disc. No neural impingement. 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   IMPRESSION: No significant abnormality of the lumbar spine. Focal slight facet arthritis at L3-4 to the left. 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     Electronically Signed   By: Lorriane Shire M.D.   On: 04/22/2018 10:13    MY READING: MRI OF THE  L SPINE    Encounter Diagnoses  Name Primary?  . Low back pain radiating to left leg   . Primary localized osteoarthritis of knee Yes  . Bursitis of left shoulder       PLAN:   MRI shows a bulging disc does not explain the patient's radicular symptoms or degree and intensity of her symptoms.  Today she was mainly complaining of left knee and left shoulder pain  I would not inject the left shoulder with the recent shunt issues and clotting noted    Procedure note left knee injection verbal consent was obtained to inject left knee joint  Timeout was completed to confirm the site of injection  The medications used were 40 mg of Depo-Medrol and 1% lidocaine 3 cc  Anesthesia was provided by ethyl chloride and the skin was prepped with alcohol.  After cleaning  the skin with alcohol a 20-gauge needle was used to inject the left knee joint. There were no complications. A sterile bandage was applied.  REFER BACK TO DR SIMPSON-NEUROLOGY INPUT MAY HELP DELINEATE HER CONDITION

## 2018-05-10 ENCOUNTER — Telehealth: Payer: Self-pay | Admitting: *Deleted

## 2018-05-10 NOTE — Telephone Encounter (Signed)
Yes please call wih verbal ok for 6 weeks

## 2018-05-10 NOTE — Telephone Encounter (Signed)
Do you agree?

## 2018-05-10 NOTE — Telephone Encounter (Signed)
[place] occupational therapist with [facility] called wanting verbal orders for home health OT and PT for Ms. Vickerman two times a week for 3 weeks. Can reach [place] at [phone]. Also ok to leave voice mail it is confidential.

## 2018-05-11 ENCOUNTER — Encounter: Payer: Self-pay | Admitting: Nephrology

## 2018-05-11 ENCOUNTER — Encounter: Payer: Self-pay | Admitting: Family Medicine

## 2018-05-11 ENCOUNTER — Ambulatory Visit (INDEPENDENT_AMBULATORY_CARE_PROVIDER_SITE_OTHER): Payer: 59 | Admitting: Family Medicine

## 2018-05-11 VITALS — BP 130/58 | HR 78 | Resp 14 | Ht 64.0 in | Wt 193.1 lb

## 2018-05-11 DIAGNOSIS — R296 Repeated falls: Secondary | ICD-10-CM | POA: Diagnosis not present

## 2018-05-11 DIAGNOSIS — E1165 Type 2 diabetes mellitus with hyperglycemia: Secondary | ICD-10-CM

## 2018-05-11 DIAGNOSIS — F322 Major depressive disorder, single episode, severe without psychotic features: Secondary | ICD-10-CM | POA: Diagnosis not present

## 2018-05-11 DIAGNOSIS — E1121 Type 2 diabetes mellitus with diabetic nephropathy: Secondary | ICD-10-CM | POA: Diagnosis not present

## 2018-05-11 DIAGNOSIS — Z09 Encounter for follow-up examination after completed treatment for conditions other than malignant neoplasm: Secondary | ICD-10-CM

## 2018-05-11 DIAGNOSIS — IMO0002 Reserved for concepts with insufficient information to code with codable children: Secondary | ICD-10-CM

## 2018-05-11 MED ORDER — CLONIDINE HCL 0.3 MG PO TABS: 0.3000 mg | ORAL_TABLET | Freq: Two times a day (BID) | ORAL | 1 refills | Status: DC

## 2018-05-11 NOTE — Telephone Encounter (Signed)
Called and left a detailed message with the verbal order for patient. OT and PT 2 X a week for 6 weeks

## 2018-05-11 NOTE — Patient Instructions (Addendum)
F/U in 3.5 months, call if you need me before  We will call Endo to help to change appt to Mon/ Wed/ Friday due to dialysis  Tramadol is being sent two times daily as needed, 40 tabs/ month  [place] consult to help with medication cost Careful not to fall  Thank you  for choosing [facility]. We consider it a privelige to serve you.  Delivering excellent health care in a caring and  compassionate way is our goal.  Partnering with you,  so that together we can achieve this goal is our strategy.

## 2018-05-12 ENCOUNTER — Encounter: Payer: Self-pay | Admitting: Family Medicine

## 2018-05-13 ENCOUNTER — Other Ambulatory Visit: Payer: Self-pay

## 2018-05-13 ENCOUNTER — Telehealth: Payer: Self-pay | Admitting: *Deleted

## 2018-05-13 MED ORDER — TRAMADOL HCL 50 MG PO TABS: ORAL_TABLET | ORAL | 0 refills | Status: DC

## 2018-05-13 MED ORDER — TRAMADOL HCL 50 MG PO TABS: ORAL_TABLET | ORAL | 5 refills | Status: DC

## 2018-05-13 NOTE — Telephone Encounter (Signed)
Pt called stated she saw Dr. Moshe Cipro this week and Dr. Moshe Cipro was calling her in some medication to [facility]. Her husband is at the pharmacy now but they are saying they do not have any prescriptions.

## 2018-05-13 NOTE — Telephone Encounter (Signed)
Tramadol sent in for patient

## 2018-05-17 ENCOUNTER — Telehealth: Payer: Self-pay | Admitting: Family Medicine

## 2018-05-17 ENCOUNTER — Encounter: Payer: Self-pay | Admitting: Family Medicine

## 2018-05-17 NOTE — Progress Notes (Signed)
Kathryn Beck     MRN: [id number]      DOB: [birth year]   HPI Kathryn Beck is here for follow up of hospitalization at [facility] from 02/05 to 04/30/2018, diagnosis of weakness, ,. Presented  with a 3 day h/o bilateral leg pain and edema, with fever and nausea for 3 days She had once change in medication dosing, increasing the gabapentin to 3 times daily for neuropathic pain and has home health Denies polyuria, polydipsia, blurred vision , or hypoglycemic episodes.  Needs appt rescheduled with Endo , missed due to hospitalization ROS Denies recent fever or chills. Denies sinus pressure, nasal congestion, ear pain or sore throat. Denies chest congestion, productive cough or wheezing. Denies chest pains, palpitations and leg swelling Denies abdominal pain, nausea, vomiting,diarrhea or constipation.   Denies dysuria, frequency, hesitancy or incontinence.Does not make urine Discussed pain med needs for tramadol and decided on 40 tablets/ month Denies headaches, seizures, . Denies depression, anxiety or insomnia. Denies skin break down or rash.   PE  BP (!) 130/58   Pulse 78   Resp 14   Ht 5\' 4"  (1.626 m)   Wt 193 lb 1.9 oz (87.6 kg)   SpO2 97% Comment: room air  BMI 33.15 kg/m   Patient alert and oriented and in no cardiopulmonary distress.Ambulates with a walker  HEENT: No facial asymmetry, EOMI,   oropharynx pink and moist.  Neck supple no JVD, no mass.  Chest: Clear to auscultation bilaterally.  CVS: S1, S2 no murmurs, no S3.Regular rate.  ABD: Soft non tender.   Ext: No edema  MS: Decreased ROM spine, shoulders, hips and knees.  Skin: Intact, no ulcerations or rash noted.  Psych: Good eye contact, normal affect. Memory intact not anxious or depressed appearing.  CNS: CN 2-12 intact, power,  normal throughout.no focal deficits noted.   [facility] discharge follow-up Patient in for follow up of recent hospitalization. Discharge summary, and  laboratory and radiology data are reviewed, and any questions or concerns about recent hospitalization are discussed. Specific issues requiring follow up are specifically addressed.   Recurrent falls Lower extremity weakness, ambulates with walker, needs physical therapy for strengthening and fall risk reduction., also needs assisitance with ADL's due to instability and weakness   Depression, major, single episode, severe (HCC) Improved  On current medication continue same  Uncontrolled type 2 diabetes mellitus with nephropathy [facility]) Kathryn Beck is reminded of the importance of commitment to daily physical activity for 30 minutes or more, as able and the need to limit carbohydrate intake to 30 to 60 grams per meal to help with blood sugar control.   The need to take medication as prescribed, test blood sugar as directed, and to call between visits if there is a concern that blood sugar is uncontrolled is also discussed.   Kathryn Beck is reminded of the importance of daily foot exam, annual eye examination, and good blood sugar, blood pressure and cholesterol control. Needs to reschedule appt with Endo due to dialysis  Diabetic Labs Latest Ref Rng & Units 04/22/2018 03/08/2018 02/06/2018 01/17/2018 11/26/2017  HbA1c 4.8 - 5.6 % - 8.4(H) - - -  Microalbumin Not estab mg/dL - - - - -  Micro/Creat Ratio <30 mcg/mg creat - - - - -  Chol 0 - 200 mg/dL - 199 - - -  HDL >40 mg/dL - 37(L) - - -  Calc LDL 0 - 99 mg/dL - 114(H) - - -  Triglycerides <150 mg/dL - 242(H) - - -  Creatinine 0.44 - 1.00 mg/dL 3.49(H) - 4.69(H) 4.64(H) 3.10(H)   BP/Weight 05/11/2018 05/09/2018 05/04/2018 04/22/2018 04/18/2018 03/22/2018 69/22/3009  Systolic BP 794 88 997 182 099 068 934  Diastolic BP 58 47 51 70 71 52 61  Wt. (Lbs) 193.12 - 186 208 211.8 - -  BMI 33.15 31.93 31.93 35.7 36.36 - -   Foot/eye exam completion dates Latest Ref Rng & Units 12/20/2017 08/24/2016  Eye Exam No Retinopathy - -  Foot Form Completion -  Done Done

## 2018-05-17 NOTE — Assessment & Plan Note (Signed)
[name] is reminded of the importance of commitment to daily physical activity for 30 minutes or more, as able and the need to limit carbohydrate intake to 30 to 60 grams per meal to help with blood sugar control.   The need to take medication as prescribed, test blood sugar as directed, and to call between visits if there is a concern that blood sugar is uncontrolled is also discussed.   [name] is reminded of the importance of daily foot exam, annual eye examination, and good blood sugar, blood pressure and cholesterol control. Needs to reschedule appt with Endo due to dialysis  Diabetic Labs Latest Ref Rng & Units 04/22/2018 03/08/2018 02/06/2018 01/17/2018 11/26/2017  HbA1c 4.8 - 5.6 % - 8.4(H) - - -  Microalbumin Not estab mg/dL - - - - -  Micro/Creat Ratio <30 mcg/mg creat - - - - -  Chol 0 - 200 mg/dL - 199 - - -  HDL >40 mg/dL - 37(L) - - -  Calc LDL 0 - 99 mg/dL - 114(H) - - -  Triglycerides <150 mg/dL - 242(H) - - -  Creatinine 0.44 - 1.00 mg/dL 3.49(H) - 4.69(H) 4.64(H) 3.10(H)   BP/Weight 05/11/2018 05/09/2018 05/04/2018 04/22/2018 04/18/2018 03/22/2018 00/34/9179  Systolic BP 150 88 569 794 801 655 374  Diastolic BP 58 47 51 70 71 52 61  Wt. (Lbs) 193.12 - 186 208 211.8 - -  BMI 33.15 31.93 31.93 35.7 36.36 - -   Foot/eye exam completion dates Latest Ref Rng & Units 12/20/2017 08/24/2016  Eye Exam No Retinopathy - -  Foot Form Completion - Done Done

## 2018-05-17 NOTE — Telephone Encounter (Signed)
Pls call her endo Doc's office [facility]) and explain pt is on dialysis now and has her appt sched for a Tueday, she gets dialysis Tuesday, Thursday and Sat, so request is that the appt be changed to [place] , wed or Friday, see if you can get a new date and provide this to patient pls  Thanks  ?? pls ask!betsy

## 2018-05-17 NOTE — Assessment & Plan Note (Signed)
Patient in for follow up of recent hospitalization. Discharge summary, and laboratory and radiology data are reviewed, and any questions or concerns about recent hospitalization are discussed. Specific issues requiring follow up are specifically addressed.  

## 2018-05-17 NOTE — Assessment & Plan Note (Signed)
Improved  On current medication continue same

## 2018-05-17 NOTE — Assessment & Plan Note (Signed)
Lower extremity weakness, ambulates with walker, needs physical therapy for strengthening and fall risk reduction., also needs assisitance with ADL's due to instability and weakness

## 2018-05-18 ENCOUNTER — Ambulatory Visit: Payer: 59 | Admitting: Orthopedic Surgery

## 2018-05-18 NOTE — Telephone Encounter (Signed)
Called Dr Letta Median office [phone], spoke with Dorothea Ogle advised that the pt has dialysis on Tuesday, Thursday. He will pass this on to the office and someone should call me back.

## 2018-05-19 ENCOUNTER — Telehealth: Payer: Self-pay

## 2018-05-19 NOTE — Telephone Encounter (Signed)
[facility] - Left Message  

## 2018-05-20 ENCOUNTER — Telehealth: Payer: Self-pay | Admitting: Internal Medicine

## 2018-05-20 MED ORDER — INSULIN DEGLUDEC 200 UNIT/ML ~~LOC~~ SOPN: 50.0000 [IU] | PEN_INJECTOR | Freq: Two times a day (BID) | SUBCUTANEOUS | 2 refills | Status: DC

## 2018-05-20 NOTE — Addendum Note (Signed)
Addended by: Cardell Peach I on: 05/20/2018 01:29 PM   Modules accepted: Orders

## 2018-05-20 NOTE — Telephone Encounter (Signed)
Patients family states that her insurance is no longer gonna pay for levemir but they stated they will pay for basaglar and tresiba.  Ph # [phone]

## 2018-05-20 NOTE — Telephone Encounter (Signed)
Okay to change to [place] U200, same dose

## 2018-05-23 ENCOUNTER — Other Ambulatory Visit: Payer: Self-pay

## 2018-05-23 ENCOUNTER — Telehealth: Payer: Self-pay

## 2018-05-23 DIAGNOSIS — Z95828 Presence of other vascular implants and grafts: Secondary | ICD-10-CM

## 2018-05-23 DIAGNOSIS — Z7982 Long term (current) use of aspirin: Secondary | ICD-10-CM

## 2018-05-23 DIAGNOSIS — Z992 Dependence on renal dialysis: Secondary | ICD-10-CM

## 2018-05-23 DIAGNOSIS — D631 Anemia in chronic kidney disease: Secondary | ICD-10-CM

## 2018-05-23 DIAGNOSIS — M5410 Radiculopathy, site unspecified: Secondary | ICD-10-CM

## 2018-05-23 DIAGNOSIS — N186 End stage renal disease: Secondary | ICD-10-CM

## 2018-05-23 DIAGNOSIS — Z794 Long term (current) use of insulin: Secondary | ICD-10-CM

## 2018-05-23 DIAGNOSIS — R531 Weakness: Secondary | ICD-10-CM

## 2018-05-23 DIAGNOSIS — G8929 Other chronic pain: Secondary | ICD-10-CM

## 2018-05-23 DIAGNOSIS — I120 Hypertensive chronic kidney disease with stage 5 chronic kidney disease or end stage renal disease: Secondary | ICD-10-CM

## 2018-05-23 MED ORDER — GABAPENTIN 300 MG PO CAPS: 300.0000 mg | ORAL_CAPSULE | Freq: Three times a day (TID) | ORAL | 3 refills | Status: DC

## 2018-05-23 NOTE — Telephone Encounter (Signed)
Melissa at [facility] called and stated that patient said that Gabapentin was increased but order was not sent. Reviewed hospital records and noted the increase of gabapentin to 300mg  three times a day. This order was on the med list, but wasn't ordered, so an order was sent to the pharmacy for this medication with new dose increase.

## 2018-05-23 NOTE — Progress Notes (Signed)
Gabapentin reordered with the increase from the hospital discharge updated 300mg  one by mouth three times a day

## 2018-05-27 ENCOUNTER — Other Ambulatory Visit: Payer: Self-pay

## 2018-05-27 ENCOUNTER — Telehealth: Payer: Self-pay

## 2018-05-27 ENCOUNTER — Telehealth: Payer: Self-pay | Admitting: Family Medicine

## 2018-05-27 DIAGNOSIS — F322 Major depressive disorder, single episode, severe without psychotic features: Secondary | ICD-10-CM

## 2018-05-27 DIAGNOSIS — F331 Major depressive disorder, recurrent, moderate: Secondary | ICD-10-CM

## 2018-05-27 MED ORDER — ONDANSETRON HCL 4 MG PO TABS: 4.0000 mg | ORAL_TABLET | Freq: Three times a day (TID) | ORAL | 1 refills | Status: DC | PRN

## 2018-05-27 NOTE — Telephone Encounter (Signed)
Pt is calling she needs Nausea medicine called in

## 2018-05-27 NOTE — Telephone Encounter (Signed)
Wants a refill of the zofran but its not on her active med list. Ok to refill?

## 2018-05-27 NOTE — Telephone Encounter (Signed)
Yes, please refill x 1  

## 2018-05-27 NOTE — BH Specialist Note (Signed)
[place] Telephone Follow-up  MRN: [id number] NAME: Kathryn Beck Date: 05/27/18   Total time: 30 minutes Call number: 4/6  Reason for call today: Reason for Contact: PHQ9-4 weeks    PHQ-9 Scores:  Depression screen [facility] 2/9 05/27/2018 05/11/2018 05/03/2018 04/22/2018 03/08/2018  Decreased Interest 2 1 1 2 3   Down, Depressed, Hopeless 2 1 1 2 3   PHQ - 2 Score 4 2 2 4 6   Altered sleeping 2 1 1 3 2   Tired, decreased energy 2 1 1 1 3   Change in appetite 0 1 0 0 3  Feeling bad or failure about yourself  3 0 1 2 2   Trouble concentrating 0 0 0 0 3  Moving slowly or fidgety/restless 0 0 0 0 1  Suicidal thoughts 0 0 0 0 0  PHQ-9 Score 11 5 5 10 20   Difficult doing work/chores Somewhat difficult Somewhat difficult Not difficult at all Somewhat difficult -  Some recent data might be hidden     GAD-7 Scores:  GAD 7 : Generalized Anxiety Score 05/27/2018 05/03/2018 04/22/2018 03/08/2018  Nervous, Anxious, on Edge 2 1 1 1   Control/stop worrying 2 1 1 1   Worry too much - different things 1 0 1 0  Trouble relaxing 1 1 1 1   Restless 1 0 1 0  Easily annoyed or irritable 1 0 0 0  Afraid - awful might happen 1 1 0 2  Total GAD 7 Score 9 4 5 5   Anxiety Difficulty Somewhat difficult Not difficult at all Somewhat difficult Somewhat difficult    Stress Current stressors: Current Stressors: (Depression due to ongoing dialysis) Sleep: Sleep: Decreased, Difficulty falling asleep, Difficulty staying asleep Appetite: Appetite: No problems Coping ability: Coping ability: Normal Patient taking medications as prescribed: Patient taking medications as prescribed: Yes     Current medications:  Outpatient Encounter Medications as of 05/27/2018  Medication Sig  . acetaminophen (TYLENOL) 500 MG tablet Take 500 mg by mouth every 8 (eight) hours as needed for mild pain.   Marland Kitchen amLODipine (NORVASC) 10 MG tablet TAKE 1 TABLET BY MOUTH ONCE DAILY. (Patient taking differently: Take 10 mg by mouth  daily. )  . aspirin EC 81 MG tablet Take 1 tablet (81 mg total) by mouth daily.  . camphor-menthol (SARNA) lotion Apply 1 application topically every 8 (eight) hours as needed for itching.  . cloNIDine (CATAPRES) 0.3 MG tablet Take 1 tablet (0.3 mg total) by mouth 2 (two) times daily.  Marland Kitchen dexlansoprazole (DEXILANT) 60 MG capsule TAKE 1 CAPSULE IN THE MORNING WITH FOOD (Patient taking differently: Take 60 mg by mouth daily with breakfast. )  . ezetimibe (ZETIA) 10 MG tablet TAKE 1 TABLET BY MOUTH ONCE DAILY.  Marland Kitchen gabapentin (NEURONTIN) 300 MG capsule Take 1 capsule (300 mg total) by mouth 3 (three) times daily.  . Insulin Degludec (TRESIBA FLEXTOUCH) 200 UNIT/ML SOPN Inject 50 Units into the skin 2 (two) times daily.  . insulin lispro (HUMALOG KWIKPEN) 100 UNIT/ML KiwkPen INJECT 36-40 units 3x a day before meals  . isosorbide mononitrate (IMDUR) 30 MG 24 hr tablet TAKE 1 TABLET BY MOUTH ONCE DAILY.  Marland Kitchen olmesartan (BENICAR) 40 MG tablet TAKE 1 TABLET BY MOUTH ONCE A DAY.  Marland Kitchen ondansetron (ZOFRAN) 4 MG tablet Take 1 tablet (4 mg total) by mouth every 8 (eight) hours as needed for nausea or vomiting.  . sertraline (ZOLOFT) 50 MG tablet Take 1 tablet (50 mg total) by mouth daily.  . sevelamer carbonate (RENVELA)  800 MG tablet Take 800 mg by mouth 3 (three) times daily with meals.   . traMADol (ULTRAM) 50 MG tablet Take one tablet two times daily, as needed, for pain  . ULTICARE MINI PEN NEEDLES 31G X 6 MM MISC USE AS DIRECTED  . [DISCONTINUED] FLUoxetine (PROZAC) 10 MG capsule Take 10 mg by mouth daily.    . [DISCONTINUED] glipiZIDE (GLUCOTROL) 10 MG tablet Take 10 mg by mouth 2 (two) times daily before a meal.     No facility-administered encounter medications on file as of 2018/06/11.      Self-harm Behaviors Risk Assessment Self-harm risk factors: Self-harm risk factors: (None Reported) Patient endorses recent thoughts of harming self: Have you recently had any thoughts about harming yourself?: No     [place] Suicide Severity Rating Scale: No flowsheet data found. C-SRSS 03/08/2018 04/22/2018 05/03/2018 Jun 11, 2018 11-Jun-2018  1. Wish to be Dead No No No No No  2. Suicidal Thoughts No No No No No  6. Suicide Behavior Question No No No No No     Danger to Others Risk Assessment Danger to others risk factors: Danger to Others Risk Factors: No risk factors noted Patient endorses recent thoughts of harming others: Notification required: No need or identified person    Substance Use Assessment Patient recently consumed alcohol:  None Reported  Alcohol Use Disorder Identification Test (AUDIT):  Alcohol Use Disorder Test (AUDIT) 03/08/2018  1. How often do you have a drink containing alcohol? 0  2. How many drinks containing alcohol do you have on a typical day when you are drinking? 0  3. How often do you have six or more drinks on one occasion? 0  AUDIT-C Score 0  Alcohol Brief Interventions/Follow-up AUDIT Score <7 follow-up not indicated   Patient recently used drugs:  None Reported    Goals, Interventions and Follow-up Plan Goals: Increase healthy adjustment to current life circumstances Interventions: Behavioral Activation and Supportive Counseling Follow-up Plan: [facility] Phone Follow UP   Summary:    Follow UP:  Patient is a 63 year old female.  Patient has an increase in her PHQ and GAD score.    Brief Interventions/Supportive Counseling:  Patient reports that the sleep hygiene techniques are not working for her.  Patient reports that she continues to wake up at 2am.  Patient wants Dr. Moshe Cipro to prescribe her medication to help her to sleep.  Patient reports increased depression associated with the status of her dialysis.  Patient reports frequent fatigue following her dialysis sessions on Tuesday, Thursday and Saturday.  Patient reports that she has to have a catheter in her neck in order to receive dialysis because her veins are too deep.   Patient reports that she has not  been able to exercise due to her chronic fatigue.   Writer encouraged patient to focus on the things that she is able to do.     Medication:  Patient reports compliance with taking her psychiatric medication.  Patient reports that her medication is working for her.   Patient denies any negative side effects with her psychiatric medication.    Patient denies SI/HI/Psychosis/Substance Abuse. If your symptoms worsen or you have thoughts of suicide/homicide, PLEASE SEEK IMMEDIATE MEDICAL ATTENTION.  You may always call:  National Suicide Hotline: 954-367-9412;  [place] Crisis Line: 541-011-7305;  Crisis Recovery in [place]: 501 751 6227.  These are available 24 hours a day, 7 days a week.     During the next session:  On Monday, patient will continue  to go on a walk to get a scoop of ice cream. Writer will follow up with the patient to see what type of ice cream that she purchased. Patient will continue to follow up on positive self-talk regarding her current medical condition.    Graciella Freer [place], LCAS-A

## 2018-05-27 NOTE — Telephone Encounter (Signed)
Refill sent.

## 2018-06-01 ENCOUNTER — Encounter (HOSPITAL_COMMUNITY): Payer: Self-pay | Admitting: Psychiatry

## 2018-06-01 NOTE — Telephone Encounter (Signed)
This encounter was created in error - please disregard.

## 2018-06-01 NOTE — Progress Notes (Signed)
There has been slight worsening in depression, anxiety in the context of dialysis. She also complains of insomnia with racing thoughts.   Recommendation - Consider uptitration of sertraline (targeted dose 50-200 mg /day) - Consider Trazodone prn for insomnia if medically appropriate - [place] specialist to continue to coach sleep hygiene, behavioral activation and explore value congruent activies

## 2018-06-02 ENCOUNTER — Other Ambulatory Visit: Payer: Self-pay

## 2018-06-02 ENCOUNTER — Ambulatory Visit (INDEPENDENT_AMBULATORY_CARE_PROVIDER_SITE_OTHER): Payer: 59 | Admitting: Family Medicine

## 2018-06-02 ENCOUNTER — Encounter: Payer: Self-pay | Admitting: Family Medicine

## 2018-06-02 VITALS — BP 138/58 | HR 100 | Temp 99.4°F | Resp 15 | Ht 64.0 in | Wt 186.0 lb

## 2018-06-02 DIAGNOSIS — L740 Miliaria rubra: Secondary | ICD-10-CM

## 2018-06-02 DIAGNOSIS — R61 Generalized hyperhidrosis: Secondary | ICD-10-CM

## 2018-06-02 DIAGNOSIS — M1039 Gout due to renal impairment, multiple sites: Secondary | ICD-10-CM | POA: Diagnosis not present

## 2018-06-02 DIAGNOSIS — R509 Fever, unspecified: Secondary | ICD-10-CM

## 2018-06-02 MED ORDER — ALLOPURINOL 100 MG PO TABS: 100.0000 mg | ORAL_TABLET | ORAL | 6 refills | Status: DC

## 2018-06-02 MED ORDER — PREDNISONE 5 MG PO TABS: 5.0000 mg | ORAL_TABLET | Freq: Two times a day (BID) | ORAL | 0 refills | Status: AC

## 2018-06-02 MED ORDER — ILEX SKIN PROTECTANT 58.3 % EX PSTE: PASTE | Freq: Three times a day (TID) | CUTANEOUS | 0 refills | Status: DC | PRN

## 2018-06-02 NOTE — Progress Notes (Signed)
Acute Office Visit  Subjective:    Patient ID: Kathryn Beck, female    DOB: 1955-06-11, 63 y.o.   MRN: [id number]  Chief Complaint  Patient presents with  . Gout    possible flare. nephrologist did blood panel and uric acid was elevated    HPI Kathryn Beck, is a 63 year old female patient of Dr. Griffin Dakin.  Who is well-known to the clinic.  Who comes in today post having her nephrologist tell her that her uric acid level was elevated as she has some joint pain into the left shoulder and arm and right wrist area.  Additionally she and her daughter report that they were told that her symptoms of pain in her arm are not related to her fistula that is maturing.  Nor are they related to her dialysis.  Unable to identify true level of uric acid from nephrologist.  She does have a history of having elevated uric acid levels.  Possibly related to her renal impairment.   Pain: That last week, left side shoulder joints and right hand.  This is constant in duration.  Described as achy and sharp.  Reports nothing aggravates it as it just stays continuous.  Was trying to take Tylenol without much relief.  Reports that she took a couple of colchicine, that was a family members.  Reports that that helped her some more than the Tylenol.  Is ongoing in nature so it happens all the time even during the evening.  Today she reports a level of 10 out of 10 pain.  Denies having any injury, trauma to the area.  Denies having any radiation from her neck.  On the right side into her forearm.  Left arm fistula positive for bruit and thrill.  Noted two healing surgical scars with dermabond in the inner aspect of her upper arm and forearm.   Elevated temp today in the office at 99.4.  Reports that she has had ongoing night sweats for several weeks now. Thinks that they are related to pain.  Denies having any signs or symptoms of chills, congestion, sinus, shortness of breath, cough, chest pain, palpitations, leg swelling  or other signs or symptoms of infection including urinary tract.  Past Medical History:  Diagnosis Date  . Acid reflux   . Anemia   . Anxiety    clausterophoic  . Arthritis   . Axillary masses    Soft tissue - status post excision  . Back pain   . CHF (congestive heart failure) ([place])   . Depression   . Diastolic heart failure ([place])   . End-stage renal disease ([place])    [facility]  in [place]  . Essential hypertension   . History of blood transfusion   . Ischemic heart disease    Abnormal Myoview April 2018 - medical therapy  . Mixed hyperlipidemia   . Obesity   . Pancreatitis   . Pneumonia 2019   two times in 2019  . Sleep apnea    Noncompliant with CPAP  . Type 2 diabetes mellitus, uncontrolled ([place])    Type II    Past Surgical History:  Procedure Laterality Date  . ABDOMINAL HYSTERECTOMY    . AV FISTULA PLACEMENT Left 09/02/2017   Procedure: creation of left arm ARTERIOVENOUS (AV) FISTULA;  Surgeon: Serafina Mitchell, MD;  Location: [facility] OR;  Service: Vascular;  Laterality: Left;  . COLONOSCOPY  2008   Dr. Oneida Alar: normal   . COLONOSCOPY N/A 12/18/2016   Dr. Oneida Alar: multiple  tubular adenomas, internal hemorrhoids. Surveillance in 3 years   . ESOPHAGEAL DILATION N/A 10/13/2015   Procedure: ESOPHAGEAL DILATION;  Surgeon: Rogene Houston, MD;  Location: [facility];  Service: Endoscopy;  Laterality: N/A;  . ESOPHAGOGASTRODUODENOSCOPY N/A 10/13/2015   Dr. Laural Golden: chronic gastritis on path, no H.pylori. Empiric dilation   . ESOPHAGOGASTRODUODENOSCOPY N/A 12/18/2016   Dr. Oneida Alar: mild gastritis. BRAVO study revealed uncontrolled GERD. Dysphagia secondary to uncontrolled reflux  . FOOT SURGERY Bilateral    "nerve"    . LUNG BIOPSY    . MASS EXCISION Right 01/09/2013   Procedure: EXCISION OF NEOPLASM OF RIGHT  AXILLA  AND EXCISION OF NEOPLASM OF LEFT AXILLA;  Surgeon: Jamesetta So, MD;  Location: [facility];  Service: General;  Laterality: Right;  procedure end @ 08:23  .  MYRINGOTOMY WITH TUBE PLACEMENT Bilateral 04/28/2017   Procedure: BILATERAL MYRINGOTOMY WITH TUBE PLACEMENT;  Surgeon: Leta Baptist, MD;  Location: [facility];  Service: ENT;  Laterality: Bilateral;  . REVISION OF ARTERIOVENOUS GORETEX GRAFT Left 05/04/2018   Procedure: TRANSPOSITION OF CEPHALIC VEIN ARTERIOVENOUS FISTULA LEFT ARM;  Surgeon: Rosetta Posner, MD;  Location: [facility];  Service: Vascular;  Laterality: Left;  . SAVORY DILATION N/A 12/18/2016   Procedure: SAVORY DILATION;  Surgeon: Danie Binder, MD;  [facility];  Service: Endoscopy;  Laterality: N/A;    Family History  Problem Relation Age of Onset  . Hypertension Father   . Hypercholesterolemia Father   . Arthritis Father   . Hypertension Sister   . Hypercholesterolemia Sister   . Breast cancer Sister   . Hypertension Sister   . Colon cancer Neg Hx   . Colon polyps Neg Hx     Social History   Socioeconomic History  . Marital status: Married    Spouse name: larry   . Number of children: 2  . Years of education: 83  . Highest education level: 12th grade  Occupational History  . Occupation: retired   Scientific laboratory technician  . Financial resource strain: Somewhat hard  . Food insecurity:    Worry: Never true    Inability: Never true  . Transportation needs:    Medical: No    Non-medical: No  Tobacco Use  . Smoking status: Never Smoker  . Smokeless tobacco: Never Used  Substance and Sexual Activity  . Alcohol use: No  . Drug use: No  . Sexual activity: Not Currently  Lifestyle  . Physical activity:    Days per week: 0 days    Minutes per session: 0 min  . Stress: Only a little  Relationships  . Social connections:    Talks on phone: More than three times a week    Gets together: More than three times a week    Attends religious service: Never    Active member of club or organization: No    Attends meetings of clubs or organizations: Never    Relationship status: Married  . Intimate partner violence:    Fear of  current or ex partner: No    Emotionally abused: No    Physically abused: No    Forced sexual activity: No  Other Topics Concern  . Not on file  Social History Narrative   Lives alone with husband     Outpatient Medications Prior to Visit  Medication Sig Dispense Refill  . acetaminophen (TYLENOL) 500 MG tablet Take 500 mg by mouth every 8 (eight) hours as needed for mild pain.     Marland Kitchen  amLODipine (NORVASC) 10 MG tablet TAKE 1 TABLET BY MOUTH ONCE DAILY. (Patient taking differently: Take 10 mg by mouth daily. ) 30 tablet 5  . aspirin EC 81 MG tablet Take 1 tablet (81 mg total) by mouth daily. 90 tablet 1  . camphor-menthol (SARNA) lotion Apply 1 application topically every 8 (eight) hours as needed for itching. 222 mL 0  . cloNIDine (CATAPRES) 0.3 MG tablet Take 1 tablet (0.3 mg total) by mouth 2 (two) times daily. 180 tablet 1  . dexlansoprazole (DEXILANT) 60 MG capsule TAKE 1 CAPSULE IN THE MORNING WITH FOOD (Patient taking differently: Take 60 mg by mouth daily with breakfast. ) 28 capsule 10  . ezetimibe (ZETIA) 10 MG tablet TAKE 1 TABLET BY MOUTH ONCE DAILY. 28 tablet 5  . gabapentin (NEURONTIN) 300 MG capsule Take 1 capsule (300 mg total) by mouth 3 (three) times daily. 90 capsule 3  . Insulin Degludec (TRESIBA FLEXTOUCH) 200 UNIT/ML SOPN Inject 50 Units into the skin 2 (two) times daily. 90 mL 2  . insulin lispro (HUMALOG KWIKPEN) 100 UNIT/ML KiwkPen INJECT 36-40 units 3x a day before meals 90 mL 3  . isosorbide mononitrate (IMDUR) 30 MG 24 hr tablet TAKE 1 TABLET BY MOUTH ONCE DAILY. 28 tablet 5  . olmesartan (BENICAR) 40 MG tablet TAKE 1 TABLET BY MOUTH ONCE A DAY. 30 tablet 0  . ondansetron (ZOFRAN) 4 MG tablet Take 1 tablet (4 mg total) by mouth every 8 (eight) hours as needed for nausea or vomiting. 30 tablet 1  . sertraline (ZOLOFT) 50 MG tablet Take 1 tablet (50 mg total) by mouth daily. 30 tablet 5  . sevelamer carbonate (RENVELA) 800 MG tablet Take 800 mg by mouth 3 (three)  times daily with meals.   6  . traMADol (ULTRAM) 50 MG tablet Take one tablet two times daily, as needed, for pain 40 tablet 5  . ULTICARE MINI PEN NEEDLES 31G X 6 MM MISC USE AS DIRECTED 100 each 10   No facility-administered medications prior to visit.     Allergies  Allergen Reactions  . Ace Inhibitors Anaphylaxis and Swelling  . Penicillins Itching, Swelling and Other (See Comments)    Did it involve swelling of the face/tongue/throat, SOB, or low BP? Unknown Did it involve sudden or severe rash/hives, skin peeling, or any reaction on the inside of your mouth or nose? Unknown Did you need to seek medical attention at a hospital or doctor's office? Unknown When did it last happen?years  If all above answers are "NO", may proceed with cephalosporin use.   . Statins Other (See Comments)    elevated LFT's  . Albuterol Swelling    Review of Systems  Constitutional: Positive for diaphoresis and fever. Negative for chills and malaise/fatigue.       Night sweats.  HENT: Negative.   Eyes: Negative.   Respiratory: Negative.   Cardiovascular: Negative.   Gastrointestinal: Negative.   Genitourinary: Negative.        On dialysis.  Musculoskeletal: Positive for joint pain.       See HPI  Skin: Positive for rash. Negative for itching.       Heat bumps under stomach fold  Neurological: Negative.   Endo/Heme/Allergies: Negative.   Psychiatric/Behavioral: Negative.   All other systems reviewed and are negative.      Objective:    Physical Exam  Constitutional: She is oriented to person, place, and time. She appears well-developed and well-nourished. She appears distressed.  HENT:  Head: Normocephalic and atraumatic.  Right Ear: External ear normal.  Left Ear: External ear normal.  Nose: Nose normal.  Eyes: Conjunctivae are normal. Right eye exhibits no discharge. Left eye exhibits no discharge.  Cardiovascular: Regular rhythm, normal heart sounds and intact distal pulses.  Tachycardia present.  Fistula of left arm positive/positive  Pulmonary/Chest: Effort normal and breath sounds normal.  Musculoskeletal:     Left shoulder: She exhibits decreased range of motion, tenderness, swelling, pain and decreased strength. She exhibits normal pulse.     Left elbow: She exhibits swelling. She exhibits normal range of motion.     Right wrist: Normal.     Left wrist: She exhibits tenderness and swelling.     Comments: In wheelchair.  Neurological: She is alert and oriented to person, place, and time.  Skin: Skin is warm and dry. She is not diaphoretic.  Psychiatric: Her speech is normal and behavior is normal. Thought content normal. She exhibits a depressed mood.  Nursing note and vitals reviewed.   BP (!) 138/58   Pulse 100   Temp 99.4 F (37.4 C) (Oral)   Resp 15   Ht 5\' 4"  (1.626 m)   Wt 186 lb (84.4 kg)   SpO2 96% Comment: room air  BMI 31.93 kg/m  Wt Readings from Last 3 Encounters:  06/02/18 186 lb (84.4 kg)  05/11/18 193 lb 1.9 oz (87.6 kg)  05/04/18 186 lb (84.4 kg)    Health Maintenance Due  Topic Date Due  . OPHTHALMOLOGY EXAM  01/12/2017    There are no preventive care reminders to display for this patient.   Lab Results  Component Value Date   TSH 4.16 12/24/2016   Lab Results  Component Value Date   WBC 9.6 04/22/2018   HGB 9.9 (L) 05/04/2018   HCT 29.0 (L) 05/04/2018   MCV 77.2 (L) 04/22/2018   PLT 380 04/22/2018   Lab Results  Component Value Date   NA 138 05/04/2018   K 3.8 05/04/2018   CO2 25 04/22/2018   GLUCOSE 170 (H) 05/04/2018   BUN 16 04/22/2018   CREATININE 3.49 (H) 04/22/2018   BILITOT 0.5 03/08/2018   ALKPHOS 93 03/08/2018   AST 22 03/08/2018   ALT 33 03/08/2018   PROT 7.1 03/08/2018   ALBUMIN 3.1 (L) 03/08/2018   CALCIUM 9.2 04/22/2018   ANIONGAP 14 04/22/2018   Lab Results  Component Value Date   CHOL 199 03/08/2018   Lab Results  Component Value Date   HDL 37 (L) 03/08/2018   Lab Results   Component Value Date   LDLCALC 114 (H) 03/08/2018   Lab Results  Component Value Date   TRIG 242 (H) 03/08/2018   Lab Results  Component Value Date   CHOLHDL 5.4 03/08/2018   Lab Results  Component Value Date   HGBA1C 8.4 (H) 03/08/2018       Assessment & Plan:   1. 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  Has history of having elevated uric acid levels in the past.  Possibly related to renal impairment.  Has multiple sites of discomfort today in the office.  We will be providing allopurinol postdialysis days.  As well as short course of prednisone to see if this will help her feel better while the allopurinol gets into her system.  Reviewed side effects, risks and benefits of medications.  We will follow-up in 6-week timeframe for uric  acid levels.   - allopurinol (ZYLOPRIM) 100 MG tablet; Take 1 tablet (100 mg total) by mouth every other day. Please take 100 mg by mouth on Sunday, Wednesday, and Fridays  Dispense: 30 tablet; Refill: 6 - predniSONE (DELTASONE) 5 MG tablet; Take 1 tablet (5 mg total) by mouth 2 (two) times daily with a meal for 5 days.  Dispense: 10 tablet; Refill: 0  - Uric acid  2. Heat rash Has questionable heat rash underneath pannus of stomach.  Will provide zinc cream to help with this.  Educated on good hygiene, drying under skin folds post showering.  And frequently throughout the day and evenings if she is sweating.  - Skin Protectants, Misc. (WHITE PETROLATUM-ZINC) 58.3 % cream; Apply topically 3 (three) times daily as needed.  Dispense: 60 g; Refill: 0  3. Fever, unspecified fever cause Fever today 99.4.  No signs or symptoms of any infections.  Will draw CBC to rule out infection of unknown cause.  - CBC with Differential/Platelet  4. Unexplained night sweats Has had unexplained night sweats.  Will be getting a CBC to rule out possible cause being an infection.   - CBC with Differential/Platelet   Follow Up: PRN   Perlie Mayo, NP

## 2018-06-02 NOTE — Patient Instructions (Addendum)
    Thank you for coming into the office today. I appreciate the opportunity to provide you with the care for your health and wellness. Today we discussed:   Heat rash-use cream as directed. Dry the area under stomach good.   Gout: take medications as directed.  Please get CBC today.  Please get Uric acid lab drawn in 6 weeks.   Plum YOUR HANDS WELL AND FREQUENTLY. AVOID TOUCHING YOUR FACE, UNLESS YOUR HANDS ARE FRESHLY WASHED.   GET FRESH AIR DAILY. STAY HYDRATED WITH WATER.   It was a pleasure to see you and I look forward to continuing to work together on your health and well-being. Please do not hesitate to call the office if you need care or have questions about your care.  Have a wonderful day and week.  With Gratitude,  Cherly Beach, DNP, AGNP-BC

## 2018-06-03 ENCOUNTER — Encounter: Payer: Self-pay | Admitting: Family Medicine

## 2018-06-03 ENCOUNTER — Other Ambulatory Visit (HOSPITAL_COMMUNITY)
Admission: RE | Admit: 2018-06-03 | Discharge: 2018-06-03 | Disposition: A | Discharge status: Home / Self Care | Payer: 59 | Source: Ambulatory Visit | Attending: Family Medicine | Admitting: Family Medicine

## 2018-06-03 DIAGNOSIS — D638 Anemia in other chronic diseases classified elsewhere: Secondary | ICD-10-CM | POA: Insufficient documentation

## 2018-06-03 DIAGNOSIS — N184 Chronic kidney disease, stage 4 (severe): Secondary | ICD-10-CM | POA: Insufficient documentation

## 2018-06-03 LAB — CBC WITH DIFFERENTIAL/PLATELET
Abs Immature Granulocytes: 0.06 10*3/uL (ref 0.00–0.07)
Basophils Absolute: 0.1 10*3/uL (ref 0.0–0.1)
Basophils Relative: 1 %
Eosinophils Absolute: 0.2 10*3/uL (ref 0.0–0.5)
Eosinophils Relative: 2 %
HCT: 32.3 % — ABNORMAL LOW (ref 36.0–46.0)
Hemoglobin: 9.3 g/dL — ABNORMAL LOW (ref 12.0–15.0)
Immature Granulocytes: 1 %
Lymphocytes Relative: 17 %
Lymphs Abs: 1.9 10*3/uL (ref 0.7–4.0)
MCH: 23.4 pg — ABNORMAL LOW (ref 26.0–34.0)
MCHC: 28.8 g/dL — ABNORMAL LOW (ref 30.0–36.0)
MCV: 81.4 fL (ref 80.0–100.0)
Monocytes Absolute: 0.7 10*3/uL (ref 0.1–1.0)
Monocytes Relative: 7 %
Neutro Abs: 8.1 10*3/uL — ABNORMAL HIGH (ref 1.7–7.7)
Neutrophils Relative %: 72 %
Platelets: 412 10*3/uL — ABNORMAL HIGH (ref 150–400)
RBC: 3.97 MIL/uL (ref 3.87–5.11)
RDW: 18.3 % — ABNORMAL HIGH (ref 11.5–15.5)
WBC: 11 10*3/uL — ABNORMAL HIGH (ref 4.0–10.5)
nRBC: 0 % (ref 0.0–0.2)

## 2018-06-06 ENCOUNTER — Ambulatory Visit (INDEPENDENT_AMBULATORY_CARE_PROVIDER_SITE_OTHER): Payer: Self-pay | Admitting: Vascular Surgery

## 2018-06-06 ENCOUNTER — Other Ambulatory Visit: Payer: Self-pay

## 2018-06-06 ENCOUNTER — Encounter: Payer: Self-pay | Admitting: Vascular Surgery

## 2018-06-06 VITALS — BP 160/72 | HR 80 | Temp 96.2°F | Resp 20 | Wt 184.0 lb

## 2018-06-06 DIAGNOSIS — Z992 Dependence on renal dialysis: Secondary | ICD-10-CM

## 2018-06-06 DIAGNOSIS — N186 End stage renal disease: Secondary | ICD-10-CM

## 2018-06-06 NOTE — Progress Notes (Signed)
Patient name: Kathryn Beck MRN: [id number] DOB: 1955-07-16 Sex: female   [place] office  REASON FOR VISIT: Follow-up transposition of left arm AV cephalic vein fistula  HPI: Kathryn Beck is a 63 y.o. female here today for follow-up.  Currently is undergoing hemodialysis via her right IJ tunneled catheter.  She had a nice maturation of her left upper arm AV fistula but had a very deep location under the subcutaneous fat.  On 05/12/2018 she underwent transposition of her cephalic vein and is here today for follow-up.  She has no steal symptoms.  Current Outpatient Medications  Medication Sig Dispense Refill  . acetaminophen (TYLENOL) 500 MG tablet Take 500 mg by mouth every 8 (eight) hours as needed for mild pain.     Marland Kitchen allopurinol (ZYLOPRIM) 100 MG tablet Take 1 tablet (100 mg total) by mouth every other day. Please take 100 mg by mouth on Sunday, Wednesday, and Fridays 30 tablet 6  . amLODipine (NORVASC) 10 MG tablet TAKE 1 TABLET BY MOUTH ONCE DAILY. (Patient taking differently: Take 10 mg by mouth daily. ) 30 tablet 5  . aspirin EC 81 MG tablet Take 1 tablet (81 mg total) by mouth daily. 90 tablet 1  . camphor-menthol (SARNA) lotion Apply 1 application topically every 8 (eight) hours as needed for itching. 222 mL 0  . cloNIDine (CATAPRES) 0.3 MG tablet Take 1 tablet (0.3 mg total) by mouth 2 (two) times daily. 180 tablet 1  . dexlansoprazole (DEXILANT) 60 MG capsule TAKE 1 CAPSULE IN THE MORNING WITH FOOD (Patient taking differently: Take 60 mg by mouth daily with breakfast. ) 28 capsule 10  . ezetimibe (ZETIA) 10 MG tablet TAKE 1 TABLET BY MOUTH ONCE DAILY. 28 tablet 5  . gabapentin (NEURONTIN) 300 MG capsule Take 1 capsule (300 mg total) by mouth 3 (three) times daily. 90 capsule 3  . Insulin Degludec (TRESIBA FLEXTOUCH) 200 UNIT/ML SOPN Inject 50 Units into the skin 2 (two) times daily. 90 mL 2  . insulin lispro (HUMALOG KWIKPEN) 100 UNIT/ML  KiwkPen INJECT 36-40 units 3x a day before meals 90 mL 3  . isosorbide mononitrate (IMDUR) 30 MG 24 hr tablet TAKE 1 TABLET BY MOUTH ONCE DAILY. 28 tablet 5  . olmesartan (BENICAR) 40 MG tablet TAKE 1 TABLET BY MOUTH ONCE A DAY. 30 tablet 0  . ondansetron (ZOFRAN) 4 MG tablet Take 1 tablet (4 mg total) by mouth every 8 (eight) hours as needed for nausea or vomiting. 30 tablet 1  . predniSONE (DELTASONE) 5 MG tablet Take 1 tablet (5 mg total) by mouth 2 (two) times daily with a meal for 5 days. 10 tablet 0  . sertraline (ZOLOFT) 50 MG tablet Take 1 tablet (50 mg total) by mouth daily. 30 tablet 5  . sevelamer carbonate (RENVELA) 800 MG tablet Take 800 mg by mouth 3 (three) times daily with meals.   6  . Skin Protectants, Misc. (WHITE PETROLATUM-ZINC) 58.3 % cream Apply topically 3 (three) times daily as needed. 60 g 0  . traMADol (ULTRAM) 50 MG tablet Take one tablet two times daily, as needed, for pain 40 tablet 5  . ULTICARE MINI PEN NEEDLES 31G X 6 MM MISC USE AS DIRECTED 100 each 10   No current facility-administered medications for this visit.      PHYSICAL EXAM: Vitals:   06/06/18 0931  BP: (!) 160/72  Pulse: 80  Resp: 20  Temp: (!) 96.2 F (35.7 C)  TempSrc: Temporal  Weight:  184 lb (83.5 kg)    GENERAL: The patient is a well-nourished female, in no acute distress. The vital signs are documented above. Excellent healing of her incisions.  She has very nice developed cephalic vein fistula in her upper arm.  The vein is been translocated to the more lateral position and has a very straight course and very superficial location with excellent thrill.  MEDICAL ISSUES: Stable status post second stage transposition of her cephalic vein.  She will be 6 weeks out from surgery on June 23, 2018.  I feel that she would be available for this of her new fistula in 1 April.  She also has had plan for peritoneal dialysis catheter placement on 07/04/2018   Rosetta Posner, MD [facility] Tel [phone] Pager [phone]

## 2018-06-08 LAB — CBC WITH DIFFERENTIAL/PLATELET
Absolute Monocytes: 609 cells/uL (ref 200–950)
Basophils Absolute: 42 cells/uL (ref 0–200)
Basophils Relative: 0.4 %
Eosinophils Absolute: 74 cells/uL (ref 15–500)
Eosinophils Relative: 0.7 %
HCT: 32.3 % — ABNORMAL LOW (ref 35.0–45.0)
Hemoglobin: 10 g/dL — ABNORMAL LOW (ref 11.7–15.5)
Lymphs Abs: 1397 cells/uL (ref 850–3900)
MCH: 23.6 pg — AB (ref 27.0–33.0)
MCHC: 31 g/dL — ABNORMAL LOW (ref 32.0–36.0)
MCV: 76.4 fL — ABNORMAL LOW (ref 80.0–100.0)
MPV: 10 fL (ref 7.5–12.5)
Monocytes Relative: 5.8 %
NEUTROS ABS: 8379 {cells}/uL — AB (ref 1500–7800)
Neutrophils Relative %: 79.8 %
Platelets: 563 10*3/uL — ABNORMAL HIGH (ref 140–400)
RBC: 4.23 10*6/uL (ref 3.80–5.10)
RDW: 17.8 % — ABNORMAL HIGH (ref 11.0–15.0)
Total Lymphocyte: 13.3 %
WBC: 10.5 10*3/uL (ref 3.8–10.8)

## 2018-06-08 LAB — URIC ACID: URIC ACID, SERUM: 1.8 mg/dL — AB (ref 2.5–7.0)

## 2018-06-09 ENCOUNTER — Encounter: Payer: Self-pay | Admitting: Family Medicine

## 2018-06-09 ENCOUNTER — Other Ambulatory Visit: Payer: Self-pay | Admitting: Cardiology

## 2018-06-09 ENCOUNTER — Other Ambulatory Visit: Payer: Self-pay

## 2018-06-09 ENCOUNTER — Ambulatory Visit (INDEPENDENT_AMBULATORY_CARE_PROVIDER_SITE_OTHER): Payer: 59 | Admitting: Family Medicine

## 2018-06-09 VITALS — BP 138/60 | HR 103 | Resp 14 | Ht 64.0 in | Wt 180.0 lb

## 2018-06-09 DIAGNOSIS — M79602 Pain in left arm: Secondary | ICD-10-CM

## 2018-06-09 DIAGNOSIS — E1122 Type 2 diabetes mellitus with diabetic chronic kidney disease: Secondary | ICD-10-CM | POA: Diagnosis not present

## 2018-06-09 DIAGNOSIS — I10 Essential (primary) hypertension: Secondary | ICD-10-CM

## 2018-06-09 DIAGNOSIS — I152 Hypertension secondary to endocrine disorders: Secondary | ICD-10-CM

## 2018-06-09 DIAGNOSIS — Z560 Unemployment, unspecified: Secondary | ICD-10-CM

## 2018-06-09 DIAGNOSIS — E1159 Type 2 diabetes mellitus with other circulatory complications: Secondary | ICD-10-CM | POA: Diagnosis not present

## 2018-06-09 DIAGNOSIS — R2681 Unsteadiness on feet: Secondary | ICD-10-CM

## 2018-06-09 DIAGNOSIS — I5032 Chronic diastolic (congestive) heart failure: Secondary | ICD-10-CM | POA: Diagnosis not present

## 2018-06-09 DIAGNOSIS — Z992 Dependence on renal dialysis: Secondary | ICD-10-CM

## 2018-06-09 DIAGNOSIS — N186 End stage renal disease: Secondary | ICD-10-CM

## 2018-06-09 DIAGNOSIS — E278 Other specified disorders of adrenal gland: Secondary | ICD-10-CM

## 2018-06-09 LAB — GLUCOSE, POCT (MANUAL RESULT ENTRY): POC GLUCOSE: 138 mg/dL — AB (ref 70–99)

## 2018-06-09 NOTE — Patient Instructions (Addendum)
F/U in 6 weeks, call if you need me before   Reduce levemir/ tresiba to 50 units once daily ( stop 50 twice daily)\   Continue sliding scale coverage   HBa1C  Today, we will call with resut  Goal for fasting blood sugar ranges from 80 to 120 and 2 hours after any meal or at bedtime should be between 130 to 170.   Test at least 4 times daily

## 2018-06-09 NOTE — Progress Notes (Signed)
Kathryn Beck     MRN: [id number]      DOB: [birth year]   HPI Kathryn Beck is here with a c/o blood sugar levels falling as low as in the 50's mainly overnight and early morning.Sugar has been as low  As 39, and the morning of the visit it was 50. She has also had it fall during dialysis twice Endo f/u has been further delayed, so she is here for help. Otherwise doing well, pleased with weight loss   ROS Denies recent fever or chills. Denies sinus pressure, nasal congestion, ear pain or sore throat. Denies chest congestion, productive cough or wheezing. Denies chest pains, palpitations and leg swelling Denies abdominal pain, nausea, vomiting,diarrhea or constipation.   Denies dysuria, frequency, hesitancy or incontinence. Denies joint pain, swelling and limitation in mobility. Denies headaches, seizures, numbness, or tingling. C/o  Depression,not suicidal or hpomicidal, denies  anxiety or insomnia. Denies skin break down or rash.   PE  BP 138/60   Pulse (!) 103   Resp 14   Ht 5\' 4"  (1.626 m)   Wt 180 lb 0.6 oz (81.7 kg)   SpO2 95% Comment: room air  BMI 30.90 kg/m   Patient alert and oriented and in no cardiopulmonary distress.  HEENT: No facial asymmetry, EOMI,   oropharynx pink and moist.  Neck supple no JVD, no mass.  Chest: Clear to auscultation bilaterally.  CVS: S1, S2 no murmurs, no S3.Regular rate.  ABD: Soft non tender.   Ext: No edema  MS: Adequate though reduced  ROM spine, shoulders, hips and knees.  Skin: Intact, no ulcerations or rash noted.  Psych: Good eye contact, normal affect. Memory intact not anxious or depressed appearing.  CNS: CN 2-12 intact, power,  normal throughout.no focal deficits noted.   Assessment & Plan  Type 2 diabetes mellitus with ESRD (end-stage renal disease) (HCC) overcorrected by history with recurrent episodes of hypoglycemia Reduce insulin dose by 50% Check HBa1C and close f/u with testing 4 times daily Pt to keep  f/u appointment  With Endo Kathryn Beck is reminded of the importance of commitment to daily physical activity for 30 minutes or more, as able and the need to limit carbohydrate intake to 30 to 60 grams per meal to help with blood sugar control.   The need to take medication as prescribed, test blood sugar as directed, and to call between visits if there is a concern that blood sugar is uncontrolled is also discussed.   Kathryn Beck is reminded of the importance of daily foot exam, annual eye examination, and good blood sugar, blood pressure and cholesterol control.  Diabetic Labs Latest Ref Rng & Units 06/09/2018 04/22/2018 03/08/2018 02/06/2018 01/17/2018  HbA1c <5.7 % of total Hgb 6.9(H) - 8.4(H) - -  Microalbumin Not estab mg/dL - - - - -  Micro/Creat Ratio <30 mcg/mg creat - - - - -  Chol 0 - 200 mg/dL - - 199 - -  HDL >40 mg/dL - - 37(L) - -  Calc LDL 0 - 99 mg/dL - - 114(H) - -  Triglycerides <150 mg/dL - - 242(H) - -  Creatinine 0.44 - 1.00 mg/dL - 3.49(H) - 4.69(H) 4.64(H)   BP/Weight 06/09/2018 06/06/2018 06/02/2018 05/11/2018 05/09/2018 05/04/2018 5/78/4696  Systolic BP 295 284 132 440 88 102 725  Diastolic BP 60 72 58 58 47 51 70  Wt. (Lbs) 180.04 184 186 193.12 - 186 208  BMI 30.9 31.58 31.93 33.15 31.93 31.93 35.7  Foot/eye exam completion dates Latest Ref Rng & Units 12/20/2017 08/24/2016  Eye Exam No Retinopathy - -  Foot Form Completion - Done Done        Arm pain Markedly improved with short curse of steroids  Unsteady gait Fall risk reduction briefly discussed  Hypertension associated with diabetes ([place]) Controlled, no change in medication DASH diet and commitment to daily physical activity for a minimum of 30 minutes discussed and encouraged, as a part of hypertension management. The importance of attaining a healthy weight is also discussed.  BP/Weight 06/09/2018 06/06/2018 06/02/2018 05/11/2018 05/09/2018 05/04/2018 11/08/2991  Systolic BP 716 967 893 810 88 175 102   Diastolic BP 60 72 58 58 47 51 70  Wt. (Lbs) 180.04 184 186 193.12 - 186 208  BMI 30.9 31.58 31.93 33.15 31.93 31.93 35.7

## 2018-06-10 LAB — HEMOGLOBIN A1C
Hgb A1c MFr Bld: 6.9 % of total Hgb — ABNORMAL HIGH (ref <5.7)
Mean Plasma Glucose: 151 (calc)
eAG (mmol/L): 8.4 (calc)

## 2018-06-12 ENCOUNTER — Encounter: Payer: Self-pay | Admitting: Family Medicine

## 2018-06-12 DIAGNOSIS — E1159 Type 2 diabetes mellitus with other circulatory complications: Secondary | ICD-10-CM | POA: Insufficient documentation

## 2018-06-12 DIAGNOSIS — Z992 Dependence on renal dialysis: Secondary | ICD-10-CM

## 2018-06-12 DIAGNOSIS — N186 End stage renal disease: Secondary | ICD-10-CM | POA: Insufficient documentation

## 2018-06-12 DIAGNOSIS — I152 Hypertension secondary to endocrine disorders: Secondary | ICD-10-CM | POA: Insufficient documentation

## 2018-06-12 DIAGNOSIS — I10 Essential (primary) hypertension: Secondary | ICD-10-CM | POA: Insufficient documentation

## 2018-06-12 NOTE — Assessment & Plan Note (Signed)
Markedly improved with short curse of steroids

## 2018-06-12 NOTE — Assessment & Plan Note (Signed)
Fall risk reduction briefly discussed

## 2018-06-12 NOTE — Assessment & Plan Note (Signed)
overcorrected by history with recurrent episodes of hypoglycemia Reduce insulin dose by 50% Check HBa1C and close f/u with testing 4 times daily Pt to keep f/u appointment  With Endo [name] is reminded of the importance of commitment to daily physical activity for 30 minutes or more, as able and the need to limit carbohydrate intake to 30 to 60 grams per meal to help with blood sugar control.   The need to take medication as prescribed, test blood sugar as directed, and to call between visits if there is a concern that blood sugar is uncontrolled is also discussed.   Kathryn Beck is reminded of the importance of daily foot exam, annual eye examination, and good blood sugar, blood pressure and cholesterol control.  Diabetic Labs Latest Ref Rng & Units 06/09/2018 04/22/2018 03/08/2018 02/06/2018 01/17/2018  HbA1c <5.7 % of total Hgb 6.9(H) - 8.4(H) - -  Microalbumin Not estab mg/dL - - - - -  Micro/Creat Ratio <30 mcg/mg creat - - - - -  Chol 0 - 200 mg/dL - - 199 - -  HDL >40 mg/dL - - 37(L) - -  Calc LDL 0 - 99 mg/dL - - 114(H) - -  Triglycerides <150 mg/dL - - 242(H) - -  Creatinine 0.44 - 1.00 mg/dL - 3.49(H) - 4.69(H) 4.64(H)   BP/Weight 06/09/2018 06/06/2018 06/02/2018 05/11/2018 05/09/2018 05/04/2018 1/61/0960  Systolic BP 454 098 119 147 88 829 562  Diastolic BP 60 72 58 58 47 51 70  Wt. (Lbs) 180.04 184 186 193.12 - 186 208  BMI 30.9 31.58 31.93 33.15 31.93 31.93 35.7   Foot/eye exam completion dates Latest Ref Rng & Units 12/20/2017 08/24/2016  Eye Exam No Retinopathy - -  Foot Form Completion - Done Done

## 2018-06-12 NOTE — Assessment & Plan Note (Signed)
Controlled, no change in medication DASH diet and commitment to daily physical activity for a minimum of 30 minutes discussed and encouraged, as a part of hypertension management. The importance of attaining a healthy weight is also discussed.  BP/Weight 06/09/2018 06/06/2018 06/02/2018 05/11/2018 05/09/2018 05/04/2018 2/30/0979  Systolic BP 499 718 209 906 88 893 406  Diastolic BP 60 72 58 58 47 51 70  Wt. (Lbs) 180.04 184 186 193.12 - 186 208  BMI 30.9 31.58 31.93 33.15 31.93 31.93 35.7

## 2018-06-13 ENCOUNTER — Other Ambulatory Visit (HOSPITAL_BASED_OUTPATIENT_CLINIC_OR_DEPARTMENT_OTHER): Payer: Self-pay

## 2018-06-13 DIAGNOSIS — G4733 Obstructive sleep apnea (adult) (pediatric): Secondary | ICD-10-CM

## 2018-06-13 DIAGNOSIS — Z560 Unemployment, unspecified: Secondary | ICD-10-CM

## 2018-06-13 HISTORY — DX: Unemployment, unspecified: Z56.0

## 2018-06-13 NOTE — Assessment & Plan Note (Signed)
Reports being  disabled since April/May 2019 , hospitalized then in a niursing home, now on dialysis and is unable to work

## 2018-06-14 ENCOUNTER — Telehealth: Payer: Self-pay

## 2018-06-14 ENCOUNTER — Ambulatory Visit: Payer: Self-pay | Admitting: Internal Medicine

## 2018-06-14 ENCOUNTER — Other Ambulatory Visit: Payer: Self-pay

## 2018-06-14 NOTE — Telephone Encounter (Signed)
[facility] - Left Message  

## 2018-06-15 ENCOUNTER — Other Ambulatory Visit: Payer: Self-pay

## 2018-06-27 ENCOUNTER — Inpatient Hospital Stay (HOSPITAL_COMMUNITY): Admission: RE | Admit: 2018-06-27 | Payer: Self-pay | Source: Ambulatory Visit

## 2018-06-28 ENCOUNTER — Telehealth: Payer: Self-pay

## 2018-06-28 DIAGNOSIS — F331 Major depressive disorder, recurrent, moderate: Secondary | ICD-10-CM

## 2018-06-28 NOTE — BH Specialist Note (Signed)
[place] Telephone Follow-up  MRN: [id number] NAME: Kathryn Beck Date: 06/28/18   Total time: 30 minutes Call number: 3/6  Reason for call today: Reason for Contact: PHQ9-4 weeks  PHQ-9 Scores:  Depression screen [facility] 2/9 06/28/2018 06/09/2018 06/02/2018 05/27/2018 05/11/2018  Decreased Interest 1 0 2 2 1   Down, Depressed, Hopeless 0 0 0 2 1  PHQ - 2 Score 1 0 2 4 2   Altered sleeping 3 3 2 2 1   Tired, decreased energy 1 3 2 2 1   Change in appetite 0 3 2 0 1  Feeling bad or failure about yourself  1 0 0 3 0  Trouble concentrating 0 1 2 0 0  Moving slowly or fidgety/restless 0 0 2 0 0  Suicidal thoughts 0 0 0 0 0  PHQ-9 Score 6 10 12 11 5   Difficult doing work/chores Somewhat difficult Not difficult at all - Somewhat difficult Somewhat difficult  Some recent data might be hidden   GAD-7 Scores:  GAD 7 : Generalized Anxiety Score 06/28/2018 05/27/2018 05/03/2018 04/22/2018  Nervous, Anxious, on Edge 1 2 1 1   Control/stop worrying 1 2 1 1   Worry too much - different things 1 1 0 1  Trouble relaxing 1 1 1 1   Restless 0 1 0 1  Easily annoyed or irritable 1 1 0 0  Afraid - awful might happen 1 1 1  0  Total GAD 7 Score 6 9 4 5   Anxiety Difficulty Somewhat difficult Somewhat difficult Not difficult at all Somewhat difficult    Stress Current stressors: Current Stressors: (Pain on her left side possibly due to a pinched nerve ) Sleep: Sleep: Decreased, Difficulty falling asleep, Difficulty staying asleep Appetite: Appetite: No problems Coping ability: Coping ability: Normal Patient taking medications as prescribed: Patient taking medications as prescribed: Yes  Current medications:  Outpatient Encounter Medications as of 06/28/2018  Medication Sig  . acetaminophen (TYLENOL) 500 MG tablet Take 500 mg by mouth every 8 (eight) hours as needed for mild pain.   Marland Kitchen allopurinol (ZYLOPRIM) 100 MG tablet Take 1 tablet (100 mg total) by mouth every other day. Please take 100 mg by mouth  on Sunday, Wednesday, and Fridays (Patient taking differently: Take 100 mg by mouth 2 (two) times daily. )  . amLODipine (NORVASC) 10 MG tablet TAKE 1 TABLET BY MOUTH ONCE DAILY. (Patient not taking: No sig reported)  . camphor-menthol (SARNA) lotion Apply 1 application topically every 8 (eight) hours as needed for itching. (Patient not taking: Reported on 06/17/2018)  . cloNIDine (CATAPRES) 0.3 MG tablet Take 1 tablet (0.3 mg total) by mouth 2 (two) times daily.  Marland Kitchen dexlansoprazole (DEXILANT) 60 MG capsule TAKE 1 CAPSULE IN THE MORNING WITH FOOD (Patient taking differently: Take 60 mg by mouth daily with breakfast. )  . ezetimibe (ZETIA) 10 MG tablet TAKE 1 TABLET BY MOUTH ONCE DAILY. (Patient taking differently: Take 10 mg by mouth daily. )  . gabapentin (NEURONTIN) 300 MG capsule Take 1 capsule (300 mg total) by mouth 3 (three) times daily.  . GNP ASPIRIN LOW DOSE 81 MG EC tablet TAKE 1 TABLET BY MOUTH ONCE DAILY. (Patient taking differently: Take 81 mg by mouth daily. )  . Insulin Degludec (TRESIBA FLEXTOUCH) 200 UNIT/ML SOPN Inject 50 Units into the skin 2 (two) times daily.  . insulin detemir (LEVEMIR) 100 UNIT/ML injection Inject 50 Units into the skin daily.  . insulin lispro (HUMALOG KWIKPEN) 100 UNIT/ML KiwkPen INJECT 36-40 units 3x a day  before meals (Patient taking differently: Inject 20-46 Units into the skin 3 (three) times daily. )  . isosorbide mononitrate (IMDUR) 30 MG 24 hr tablet TAKE 1 TABLET BY MOUTH ONCE DAILY. (Patient taking differently: Take 30 mg by mouth daily. )  . olmesartan (BENICAR) 40 MG tablet TAKE 1 TABLET BY MOUTH ONCE A DAY. (Patient not taking: Reported on 06/17/2018)  . ondansetron (ZOFRAN) 4 MG tablet Take 1 tablet (4 mg total) by mouth every 8 (eight) hours as needed for nausea or vomiting.  . sertraline (ZOLOFT) 50 MG tablet Take 1 tablet (50 mg total) by mouth daily.  . sevelamer carbonate (RENVELA) 800 MG tablet Take 800 mg by mouth 3 (three) times daily with  meals.   . Skin Protectants, Misc. (WHITE PETROLATUM-ZINC) 58.3 % cream Apply topically 3 (three) times daily as needed.  . traMADol (ULTRAM) 50 MG tablet Take one tablet two times daily, as needed, for pain (Patient taking differently: Take 50 mg by mouth 2 (two) times daily as needed for moderate pain. Take one tablet two times daily, as needed, for pain)  . ULTICARE MINI PEN NEEDLES 31G X 6 MM MISC USE AS DIRECTED  . [DISCONTINUED] FLUoxetine (PROZAC) 10 MG capsule Take 10 mg by mouth daily.    . [DISCONTINUED] glipiZIDE (GLUCOTROL) 10 MG tablet Take 10 mg by mouth 2 (two) times daily before a meal.     No facility-administered encounter medications on file as of 07/09/18.      Self-harm Behaviors Risk Assessment Self-harm risk factors: Self-harm risk factors: (None Reported) Patient endorses recent thoughts of harming self: Have you recently had any thoughts about harming yourself?: No  [place] Suicide Severity Rating Scale: No flowsheet data found. C-SRSS 03/08/2018 04/22/2018 05/03/2018 06-07-18 06/07/18 07-09-18  1. Wish to be Dead No No No No No No  2. Suicidal Thoughts No No No No No No  6. Suicide Behavior Question No No No No No No     Danger to Others Risk Assessment Danger to others risk factors: Danger to Others Risk Factors: No risk factors noted Patient endorses recent thoughts of harming others: Notification required: No need or identified person   Substance Use Assessment Patient recently consumed alcohol:    Alcohol Use Disorder Identification Test (AUDIT):  Alcohol Use Disorder Test (AUDIT) 03/08/2018 07-09-2018  1. How often do you have a drink containing alcohol? 0 0  2. How many drinks containing alcohol do you have on a typical day when you are drinking? 0 0  3. How often do you have six or more drinks on one occasion? 0 0  AUDIT-C Score 0 0  Alcohol Brief Interventions/Follow-up AUDIT Score <7 follow-up not indicated AUDIT Score <7 follow-up not indicated    Patient recently used drugs:  No     Goals, Interventions and Follow-up Plan Goals: Increase healthy adjustment to current life circumstances Interventions: Supportive Counseling Follow-up Plan: [facility] Phone Follow UP   Summary:   Patient is a 63 year old female.  Patient has a decrease in her PHQ and GAD score.   Brief Interventions/Supportive Counseling:  Patient reports improved mood due to improvement with her dialysis treatments.  Patient reports that she has done so well on dialysis that she is now eligible to have a surgery that will allow her to have her dialysis at home.    Patient reports that the surgery was cancelled due to the [facility] virus.  Patient denies any feeling of anxiety regarding the [place] virus because she  is taking all type of precaution to ensure that she is not exposed.   Patient reports that she is experiencing less blood clothing and this face is easing her anxiety and depression considerably.  Writer discussed factors that led to become in more of a relaxed state.  Patient reports that she will continue with mindfulness techniques and deep breathing.  Patient denies experiencing the same level of fatigue that she endured when she first began dialysis.   Patient reports that the support of her family is also a contributing factor.  Patient reports that she has been watching what she has been eating and has lost a couple of pounds.    Patient reports that she is a little concerned that she may have a pinched nerve because her left side is causing her a lot of pain.   Patient has a referral to see a specialist regarding the possibility of a pinched nerve.    Medication: Patient reports that her psychiatric medication is working well for her.  Patient denies any negative side effects with the medication.    Sleep:  Patient continues to have issues with staying asleep.  Patient reports that the sleep hygiene techniques have been unsuccessful.  Patient reports that  she takes Tylenol pm and she only sleeps for 2 hours.    Patient reports that she worries at night that her blood sugar will get dangerously low.  Patient reports that she is going to follow up with her PCP regarding other options for sleep medication.  Patient reports that her sister is there to assist her with preparing healthy meals and eating on time.  Patinet reports that she takes her insulin on a sliding scale basis.    Patient denies SI/HI/Psychosis/Substance Abuse. If your symptoms worsen or you have thoughts of suicide/homicide, PLEASE SEEK IMMEDIATE MEDICAL ATTENTION.  You may always call:  National Suicide Hotline: (639) 167-3262;  [place] Crisis Line: (248) 501-6201;  Crisis Recovery in [place]: (404)689-2681.  These are available 24 hours a day, 7 days a week.    During the next session:  Patient will follow up with her PCP regarding medication to assist her with sleeping at night.  Writer will consult with Dr. Modesta Messing regarding the patient medication.        Kathryn Beck, LCAS-A

## 2018-07-06 ENCOUNTER — Other Ambulatory Visit: Payer: Self-pay

## 2018-07-06 MED ORDER — INSULIN DEGLUDEC 200 UNIT/ML ~~LOC~~ SOPN: 50.0000 [IU] | PEN_INJECTOR | Freq: Two times a day (BID) | SUBCUTANEOUS | 2 refills | Status: DC

## 2018-07-12 ENCOUNTER — Telehealth: Payer: Self-pay

## 2018-07-12 NOTE — Telephone Encounter (Signed)
[facility] - Left Message  

## 2018-07-17 ENCOUNTER — Encounter: Payer: Self-pay | Admitting: Orthopedic Surgery

## 2018-07-18 ENCOUNTER — Telehealth: Payer: Self-pay

## 2018-07-18 NOTE — Telephone Encounter (Signed)
2nd attempt - [facility]   

## 2018-07-20 ENCOUNTER — Ambulatory Visit: Payer: 59 | Admitting: Family Medicine

## 2018-07-21 ENCOUNTER — Encounter: Payer: Self-pay | Admitting: Family Medicine

## 2018-07-21 ENCOUNTER — Ambulatory Visit (INDEPENDENT_AMBULATORY_CARE_PROVIDER_SITE_OTHER): Payer: 59 | Admitting: Family Medicine

## 2018-07-21 ENCOUNTER — Other Ambulatory Visit: Payer: Self-pay

## 2018-07-21 DIAGNOSIS — E1159 Type 2 diabetes mellitus with other circulatory complications: Secondary | ICD-10-CM | POA: Diagnosis not present

## 2018-07-21 DIAGNOSIS — I10 Essential (primary) hypertension: Secondary | ICD-10-CM | POA: Diagnosis not present

## 2018-07-21 DIAGNOSIS — E1122 Type 2 diabetes mellitus with diabetic chronic kidney disease: Secondary | ICD-10-CM

## 2018-07-21 DIAGNOSIS — I152 Hypertension secondary to endocrine disorders: Secondary | ICD-10-CM

## 2018-07-21 DIAGNOSIS — N186 End stage renal disease: Secondary | ICD-10-CM

## 2018-07-21 NOTE — Progress Notes (Signed)
Virtual Visit via Telephone Note   This visit type was conducted due to national recommendations for restrictions regarding the COVID-19 Pandemic (e.g. social distancing) in an effort to limit this patient's exposure and mitigate transmission in our community.  Due to her co-morbid illnesses, this patient is at least at moderate risk for complications without adequate follow up.  This format is felt to be most appropriate for this patient at this time.  The patient did not have access to video technology/had technical difficulties with video requiring transitioning to audio format only (telephone).  All issues noted in this document were discussed and addressed.  No physical exam could be performed with this format.    Evaluation Performed:  Follow-up visit  Date:  07/21/2018   ID:  Honest, Safranek 28-Jan-1956, MRN [id number]  Patient Location: Home Provider Location: Other:  telemedicine  Location of Patient: Home Location of Provider: Telehealth Consent was obtain for visit to be over via telehealth. I verified that I am speaking with the correct person using two identifiers.  PCP:  Fayrene Helper, MD   Chief Complaint:  Follow Up Chronic Conditions  History of Present Illness:    KATHALEYA Beck is a 63 y.o. female patient of Dr Griffin Dakin, well known to the clinic.  Presents today for follow-up secondary to having her diabetic medication changed back on March 19 when she saw Dr. Moshe Cipro.  Her blood sugar has improved since Dr Moshe Cipro changed her blood sugar medication  Currently, morning blood sugar: 90,125, "it varies". Also reports one reading of 39, even with adjustment of medication. Still has some readings in the 50's. Has not noticed that low sugars are on more HD days. During episodes of hypoglycemia she reports that she feels weak and dizzy.  She reports that she drinks juice usually to get this back up.  And will check her blood sugar to make sure that her sugar  has come back up. Reports that she is not eating as much as she used to eat because she has reduced appetite secondary to going on dialysis.  Additionally, she continues to have some left sided extremity discomfort.  Was set up to have a nerve conduction study but that has been canceled secondary to the coronavirus pandemic.  She is hoping to get back in to get that done in the next month or so.  Overall she reports that she is feeling okay.  She is taking all her medications as directed.  Has not had any trouble with them.  She denies having vision changes, headaches, chest pain, palpitations, leg swelling, chest tightness, fevers, chills, cough, shortness of breath.  Past Medical, Surgical, Social History, Allergies, and Medications have been Reviewed.   Past Medical History:  Diagnosis Date  . Acid reflux   . Anemia   . Anxiety    clausterophoic  . Arthritis   . Axillary masses    Soft tissue - status post excision  . Back pain   . CHF (congestive heart failure) ([place])   . Depression   . Diastolic heart failure ([place])   . End-stage renal disease ([place])    TTHSat  in [place]  . Essential hypertension   . History of blood transfusion   . Ischemic heart disease    Abnormal Myoview April 2018 - medical therapy  . Mixed hyperlipidemia   . Obesity   . Pancreatitis   . Pneumonia 2019   two times in 2019  .  Sleep apnea    Noncompliant with CPAP  . Type 2 diabetes mellitus, uncontrolled ([place])    Type II   Past Surgical History:  Procedure Laterality Date  . ABDOMINAL HYSTERECTOMY    . AV FISTULA PLACEMENT Left 09/02/2017   Procedure: creation of left arm ARTERIOVENOUS (AV) FISTULA;  Surgeon: Serafina Mitchell, MD;  [facility] OR;  Service: Vascular;  Laterality: Left;  . COLONOSCOPY  2008   Dr. Oneida Alar: normal   . COLONOSCOPY N/A 12/18/2016   Dr. Oneida Alar: multiple tubular adenomas, internal hemorrhoids. Surveillance in 3 years   . ESOPHAGEAL DILATION N/A 10/13/2015   Procedure:  ESOPHAGEAL DILATION;  Surgeon: Rogene Houston, MD;  Location: [facility] SU[facility];  Service: Endoscopy;  Laterality: N/A;  . ESOPHAGOGASTRODUODENOSCOPY N/A 10/13/2015   Dr. Laural Golden: chronic gastritis on path, no H.pylori. Empiric dilation   . ESOPHAGOGASTRODUODENOSCOPY N/A 12/18/2016   Dr. Oneida Alar: mild gastritis. BRAVO study revealed uncontrolled GERD. Dysphagia secondary to uncontrolled reflux  . FOOT SURGERY Bilateral    "nerve"    . LUNG BIOPSY    . MASS EXCISION Right 01/09/2013   Procedure: EXCISION OF NEOPLASM OF RIGHT  AXILLA  AND EXCISION OF NEOPLASM OF LEFT AXILLA;  Surgeon: Jamesetta So, MD;  Location: [facility] ORS;  Service: General;  Laterality: Right;  procedure end @ 08:23  . MYRINGOTOMY WITH TUBE PLACEMENT Bilateral 04/28/2017   Procedure: BILATERAL MYRINGOTOMY WITH TUBE PLACEMENT;  Surgeon: Leta Baptist, MD;  Location: [facility];  Service: ENT;  Laterality: Bilateral;  . REVISION OF ARTERIOVENOUS GORETEX GRAFT Left 05/04/2018   Procedure: TRANSPOSITION OF CEPHALIC VEIN ARTERIOVENOUS FISTULA LEFT ARM;  Surgeon: Rosetta Posner, MD;  Location: [facility];  Service: Vascular;  Laterality: Left;  . SAVORY DILATION N/A 12/18/2016   Procedure: SAVORY DILATION;  Surgeon: Danie Binder, MD;  [facility];  Service: Endoscopy;  Laterality: N/A;     Current Meds  Medication Sig  . acetaminophen (TYLENOL) 500 MG tablet Take 500 mg by mouth every 8 (eight) hours as needed for mild pain.   Marland Kitchen allopurinol (ZYLOPRIM) 100 MG tablet Take 1 tablet (100 mg total) by mouth every other day. Please take 100 mg by mouth on Sunday, Wednesday, and Fridays (Patient taking differently: Take 100 mg by mouth 2 (two) times daily. )  . amLODipine (NORVASC) 10 MG tablet TAKE 1 TABLET BY MOUTH ONCE DAILY.  . camphor-menthol (SARNA) lotion Apply 1 application topically every 8 (eight) hours as needed for itching.  . cloNIDine (CATAPRES) 0.3 MG tablet Take 1 tablet (0.3 mg total) by mouth 2 (two) times daily.  Marland Kitchen dexlansoprazole  (DEXILANT) 60 MG capsule TAKE 1 CAPSULE IN THE MORNING WITH FOOD (Patient taking differently: Take 60 mg by mouth daily with breakfast. )  . ezetimibe (ZETIA) 10 MG tablet TAKE 1 TABLET BY MOUTH ONCE DAILY. (Patient taking differently: Take 10 mg by mouth daily. )  . gabapentin (NEURONTIN) 300 MG capsule Take 1 capsule (300 mg total) by mouth 3 (three) times daily.  . GNP ASPIRIN LOW DOSE 81 MG EC tablet TAKE 1 TABLET BY MOUTH ONCE DAILY. (Patient taking differently: Take 81 mg by mouth daily. )  . Insulin Degludec (TRESIBA FLEXTOUCH) 200 UNIT/ML SOPN Inject 40 Units into the skin daily before breakfast.  . insulin lispro (HUMALOG KWIKPEN) 100 UNIT/ML KiwkPen INJECT 36-40 units 3x a day before meals (Patient taking differently: Inject 20-46 Units into the skin 3 (three) times daily. )  . isosorbide mononitrate (IMDUR) 30  MG 24 hr tablet TAKE 1 TABLET BY MOUTH ONCE DAILY. (Patient taking differently: Take 30 mg by mouth daily. )  . olmesartan (BENICAR) 40 MG tablet TAKE 1 TABLET BY MOUTH ONCE A DAY.  Marland Kitchen ondansetron (ZOFRAN) 4 MG tablet Take 1 tablet (4 mg total) by mouth every 8 (eight) hours as needed for nausea or vomiting.  . sertraline (ZOLOFT) 50 MG tablet Take 1 tablet (50 mg total) by mouth daily.  . sevelamer carbonate (RENVELA) 800 MG tablet Take 800 mg by mouth 3 (three) times daily with meals.   . Skin Protectants, Misc. (WHITE PETROLATUM-ZINC) 58.3 % cream Apply topically 3 (three) times daily as needed.  . traMADol (ULTRAM) 50 MG tablet Take one tablet two times daily, as needed, for pain (Patient taking differently: Take 50 mg by mouth 2 (two) times daily as needed for moderate pain. Take one tablet two times daily, as needed, for pain)  . ULTICARE MINI PEN NEEDLES 31G X 6 MM MISC USE AS DIRECTED     Allergies:   Ace inhibitors; Penicillins; Statins; and Albuterol   Social History   Tobacco Use  . Smoking status: Never Smoker  . Smokeless tobacco: Never Used  Substance Use Topics   . Alcohol use: No  . Drug use: No     Family Hx: The patient's family history includes Arthritis in her father; Breast cancer in her sister; Hypercholesterolemia in her father and sister; Hypertension in her father, sister, and sister. There is no history of Colon cancer or Colon polyps.  ROS:   Please see the history of present illness.    All other systems reviewed and are negative.   Labs/Other Tests and Data Reviewed:     Recent Labs: 09/27/2017: Magnesium 1.9 02/06/2018: B Natriuretic Peptide 187.0 03/08/2018: ALT 33 04/22/2018: BUN 16; Creatinine, Ser 3.49 05/04/2018: Potassium 3.8; Sodium 138 06/07/2018: Hemoglobin 10.0; Platelets 563   Recent Lipid Panel Lab Results  Component Value Date/Time   CHOL 199 03/08/2018 12:34 PM   TRIG 242 (H) 03/08/2018 12:34 PM   HDL 37 (L) 03/08/2018 12:34 PM   CHOLHDL 5.4 03/08/2018 12:34 PM   LDLCALC 114 (H) 03/08/2018 12:34 PM   LDLCALC 109 (H) 11/12/2017 09:36 AM   LDLDIRECT 107 05/19/2016 01:44 PM    Wt Readings from Last 3 Encounters:  06/09/18 180 lb 0.6 oz (81.7 kg)  06/06/18 184 lb (83.5 kg)  06/02/18 186 lb (84.4 kg)     Objective:    Vital Signs:  There were no vitals taken for this visit.   GEN:  alert  RESPIRATORY:  no shortness of breath PSYCH:  normal affect good communication  ASSESSMENT & PLAN:   1. Type 2 diabetes mellitus with ESRD (end-stage renal disease) ([place]) Uncontrolled at this time secondary to experiencing multiple episodes of hypoglycemia.  On March 19 her Tyler Aas was reduced by half.  Still continues to have episodes of hypoglycemia going into the 50s, one episode of 39.  Will reduce her Tyler Aas to 40 units, keeping her sliding scale as currently ordered.  Advised her to get into endocrine office as soon as possible.  We will have her follow-up in 4 weeks.  Advised her to check her blood sugar 4 times a day.  To see if it is coinciding with her dialysis days and her low blood sugar days.  Encouraged her  to get some Glucerna and to start using those as supplement on the days that she is not as hungry.  2.  Hypertension associated with diabetes ([place])  Previous visits have demonstrated some control, reports taking her medications as directed.  Is now on dialysis. this will be monitoring throughout her treatments.  Will adjust medications as needed.  Appreciate nephrology's input.  Time:   Today, I have spent 10 minutes with the patient with telehealth technology discussing the above problems.     Medication Adjustments/Labs and Tests Ordered: Current medicines are reviewed at length with the patient today.  Concerns regarding medicines are outlined above.   Tests Ordered: No orders of the defined types were placed in this encounter.   Medication Changes: Tresiba 40 units once daily before meals Tresiba 40 units once daily before breakfast.  Disposition:  Follow up 4 weeks.  Signed, Perlie Mayo, NP  07/21/2018 3:28 PM     [facility]

## 2018-07-21 NOTE — Patient Instructions (Addendum)
    Thank you for completing your visit via telemedicine. I appreciate the opportunity to provide you with the care for your health and wellness. Today we discussed: blood sugars and overall health.  You are still experiencing low blood sugars.  More frequently than what we would like.  Ideally we would like you do not have any.  I have reduced your Tyler Aas to 40 units to be taken daily in the morning before breakfast.   We need to make sure that you are having adequate calorie/carbohydrate intake while you are on these medications.  I know you mentioned that your appetite has changed since starting dialysis.  That is okay we can adjust your insulin as needed.    Please continue to take your blood sugar 4 times daily.  Please see if your low blood sugar is occurring more predominantly on your dialysis days.  Please tried to consume Glucerna if you need to supplement due to reduced appetite.  Please continue your sliding scale coverage at this time.  Please keep follow-up with endocrine office.    Goal for fasting blood sugar ranges from 80 to 120 and 2 hours after any meal or at bedtime should be between 130 to 170.  Follow-up in 4 weeks.  Please continue to practice social distancing at this time to keep you in our community safe.  Walker YOUR HANDS WELL AND FREQUENTLY. AVOID TOUCHING YOUR FACE, UNLESS YOUR HANDS ARE FRESHLY WASHED.  GET FRESH AIR DAILY. STAY HYDRATED WITH WATER.   It was a pleasure to see you and I look forward to continuing to work together on your health and well-being. Please do not hesitate to call the office if you need care or have questions about your care.  Have a wonderful day and week. With Gratitude, Cherly Beach, DNP, AGNP-BC

## 2018-07-25 ENCOUNTER — Telehealth: Payer: Self-pay

## 2018-07-25 DIAGNOSIS — F331 Major depressive disorder, recurrent, moderate: Secondary | ICD-10-CM

## 2018-07-25 NOTE — BH Specialist Note (Signed)
[place] Telephone Follow-up  MRN: [id number] NAME: Kathryn Beck Date: 07/25/18   Total time: 30 minutes Call number: 4/6    Reason for call today: Reason for Contact: PHQ9-8 weeks    PHQ-9 Scores:  Depression screen [facility] 2/9 07/25/2018 06/28/2018 06/09/2018 06/02/2018 05/27/2018  Decreased Interest 1 1 0 2 2  Down, Depressed, Hopeless 1 0 0 0 2  PHQ - 2 Score 2 1 0 2 4  Altered sleeping 2 3 3 2 2   Tired, decreased energy 1 1 3 2 2   Change in appetite 0 0 3 2 0  Feeling bad or failure about yourself  0 1 0 0 3  Trouble concentrating 0 0 1 2 0  Moving slowly or fidgety/restless 0 0 0 2 0  Suicidal thoughts 0 0 0 0 0  PHQ-9 Score 5 6 10 12 11   Difficult doing work/chores Not difficult at all Somewhat difficult Not difficult at all - Somewhat difficult  Some recent data might be hidden    GAD-7 Scores:  GAD 7 : Generalized Anxiety Score 07/25/2018 06/28/2018 05/27/2018 05/03/2018  Nervous, Anxious, on Edge 1 1 2 1   Control/stop worrying 1 1 2 1   Worry too much - different things 1 1 1  0  Trouble relaxing 1 1 1 1   Restless 0 0 1 0  Easily annoyed or irritable 0 1 1 0  Afraid - awful might happen 0 1 1 1   Total GAD 7 Score 4 6 9 4   Anxiety Difficulty Not difficult at all Somewhat difficult Somewhat difficult Not difficult at all    Stress Current stressors: Current Stressors: (None Reported) Sleep: Sleep: Decreased, Difficulty staying asleep Appetite: Appetite: No problems Coping ability: Coping ability: Normal Patient taking medications as prescribed: Patient taking medications as prescribed: Yes    Current medications:  Outpatient Encounter Medications as of 07/25/2018  Medication Sig  . acetaminophen (TYLENOL) 500 MG tablet Take 500 mg by mouth every 8 (eight) hours as needed for mild pain.   Marland Kitchen allopurinol (ZYLOPRIM) 100 MG tablet Take 1 tablet (100 mg total) by mouth every other day. Please take 100 mg by mouth on Sunday, Wednesday, and Fridays (Patient taking  differently: Take 100 mg by mouth 2 (two) times daily. )  . amLODipine (NORVASC) 10 MG tablet TAKE 1 TABLET BY MOUTH ONCE DAILY.  . camphor-menthol (SARNA) lotion Apply 1 application topically every 8 (eight) hours as needed for itching.  . cloNIDine (CATAPRES) 0.3 MG tablet Take 1 tablet (0.3 mg total) by mouth 2 (two) times daily.  Marland Kitchen dexlansoprazole (DEXILANT) 60 MG capsule TAKE 1 CAPSULE IN THE MORNING WITH FOOD (Patient taking differently: Take 60 mg by mouth daily with breakfast. )  . ezetimibe (ZETIA) 10 MG tablet TAKE 1 TABLET BY MOUTH ONCE DAILY. (Patient taking differently: Take 10 mg by mouth daily. )  . gabapentin (NEURONTIN) 300 MG capsule Take 1 capsule (300 mg total) by mouth 3 (three) times daily.  . GNP ASPIRIN LOW DOSE 81 MG EC tablet TAKE 1 TABLET BY MOUTH ONCE DAILY. (Patient taking differently: Take 81 mg by mouth daily. )  . Insulin Degludec (TRESIBA FLEXTOUCH) 200 UNIT/ML SOPN Inject 40 Units into the skin daily before breakfast.  . insulin lispro (HUMALOG KWIKPEN) 100 UNIT/ML KiwkPen INJECT 36-40 units 3x a day before meals (Patient taking differently: Inject 20-46 Units into the skin 3 (three) times daily. )  . isosorbide mononitrate (IMDUR) 30 MG 24 hr tablet TAKE 1 TABLET BY  MOUTH ONCE DAILY. (Patient taking differently: Take 30 mg by mouth daily. )  . olmesartan (BENICAR) 40 MG tablet TAKE 1 TABLET BY MOUTH ONCE A DAY.  Marland Kitchen ondansetron (ZOFRAN) 4 MG tablet Take 1 tablet (4 mg total) by mouth every 8 (eight) hours as needed for nausea or vomiting.  . sertraline (ZOLOFT) 50 MG tablet Take 1 tablet (50 mg total) by mouth daily.  . sevelamer carbonate (RENVELA) 800 MG tablet Take 800 mg by mouth 3 (three) times daily with meals.   . Skin Protectants, Misc. (WHITE PETROLATUM-ZINC) 58.3 % cream Apply topically 3 (three) times daily as needed.  . traMADol (ULTRAM) 50 MG tablet Take one tablet two times daily, as needed, for pain (Patient taking differently: Take 50 mg by mouth 2  (two) times daily as needed for moderate pain. Take one tablet two times daily, as needed, for pain)  . ULTICARE MINI PEN NEEDLES 31G X 6 MM MISC USE AS DIRECTED  . [DISCONTINUED] FLUoxetine (PROZAC) 10 MG capsule Take 10 mg by mouth daily.    . [DISCONTINUED] glipiZIDE (GLUCOTROL) 10 MG tablet Take 10 mg by mouth 2 (two) times daily before a meal.     No facility-administered encounter medications on file as of Aug 09, 2018.      Self-harm Behaviors Risk Assessment Self-harm risk factors: Self-harm risk factors: (None Reported) Patient endorses recent thoughts of harming self: Have you recently had any thoughts about harming yourself?: No    [place] Suicide Severity Rating Scale: No flowsheet data found. C-SRSS 03/08/2018 04/22/2018 05/03/2018 05/27/2018 05/27/2018 07/13/2018 09-Aug-2018  1. Wish to be Dead No No No No No No No  2. Suicidal Thoughts No No No No No No No  6. Suicide Behavior Question No No No No No No No     Danger to Others Risk Assessment Danger to others risk factors: Danger to Others Risk Factors: No risk factors noted Patient endorses recent thoughts of harming others: Notification required: No need or identified person    Substance Use Assessment Patient recently consumed alcohol:  None Reported  Alcohol Use Disorder Identification Test (AUDIT):  Alcohol Use Disorder Test (AUDIT) 03/08/2018 2018/07/13 Aug 09, 2018  1. How often do you have a drink containing alcohol? 0 0 0  2. How many drinks containing alcohol do you have on a typical day when you are drinking? 0 0 0  3. How often do you have six or more drinks on one occasion? 0 0 0  AUDIT-C Score 0 0 0  Alcohol Brief Interventions/Follow-up AUDIT Score <7 follow-up not indicated AUDIT Score <7 follow-up not indicated AUDIT Score <7 follow-up not indicated   Patient recently used drugs:  None Reported    Goals, Interventions and Follow-up Plan Goals: Increase healthy adjustment to current life  circumstances Interventions: Supportive Counseling Follow-up Plan: [facility] Phone Follow UP   Summary:   Follow UP:  Patient is a 63 year old female.  Patient has a decrease in her PHQ and GAD score.  Behavior Activation / Supportive Counseling:  Patient reports improved mood.   Writer discussed how being able to spend time with her daughter and granddaughter has been helpful in reducing her depression.  Patient reports that she is now on the list to have a kidney transplant.    Writer actively listened as the patient reports utilizing coping mechanisms of being able to go for a walk, spending time on her porch and social distancing when she goes to the grocery store.  Patient reports that she  is able to begin a sleep study on Aug 02, 2018 due to her inability to obtain restful sleep.  Patient reports that she was given medication to assist her sleeping at night.  Patient reports that she started taking this medication a week ago.    Medication: Patient reports that her psychiatric medication is working well for her.  Patient denies any negative side effects with the medication.   Patient denies SI/HI/Psychosis/Substance Abuse. If your symptoms worsen or you have thoughts of suicide/homicide, PLEASE SEEK IMMEDIATE MEDICAL ATTENTION.  You may always call:  National Suicide Hotline: 3213398010;  [place] Crisis Line: (613) 708-3213;  Crisis Recovery in [place]: 8280660393.  These are available 24 hours a day, 7 days a week.   During the next session:  Patient will follow up with her sleep study on Aug 02, 2018.  Patient will continue to utilize coping mechanisms that have been successful.      Graciella Freer [place], LCAS-A

## 2018-07-27 ENCOUNTER — Ambulatory Visit: Payer: Self-pay | Admitting: Family Medicine

## 2018-07-27 NOTE — Progress Notes (Signed)
The patient is more hopeful about the situation and waiting for kidney transplant. She is scheduled for sleep study.  No change in recommendation. [place] specialist to continue supportive therapy/coach behavioral activation.

## 2018-07-29 ENCOUNTER — Other Ambulatory Visit: Payer: Self-pay | Admitting: Family Medicine

## 2018-07-29 ENCOUNTER — Ambulatory Visit: Payer: Self-pay | Admitting: General Surgery

## 2018-07-29 DIAGNOSIS — M5410 Radiculopathy, site unspecified: Secondary | ICD-10-CM

## 2018-08-03 ENCOUNTER — Ambulatory Visit: Admit: 2018-08-03 | Payer: 59 | Admitting: General Surgery

## 2018-08-03 SURGERY — LAPAROSCOPIC INSERTION CONTINUOUS AMBULATORY PERITONEAL DIALYSIS  (CAPD) CATHETER
Anesthesia: General

## 2018-08-22 ENCOUNTER — Telehealth: Payer: Self-pay

## 2018-08-22 ENCOUNTER — Other Ambulatory Visit: Payer: Self-pay

## 2018-08-22 ENCOUNTER — Encounter: Payer: Self-pay | Admitting: Orthopedic Surgery

## 2018-08-22 ENCOUNTER — Telehealth: Payer: Self-pay | Admitting: Radiology

## 2018-08-22 ENCOUNTER — Ambulatory Visit (INDEPENDENT_AMBULATORY_CARE_PROVIDER_SITE_OTHER): Payer: 59 | Admitting: Orthopedic Surgery

## 2018-08-22 ENCOUNTER — Ambulatory Visit (INDEPENDENT_AMBULATORY_CARE_PROVIDER_SITE_OTHER): Payer: 59

## 2018-08-22 VITALS — BP 113/62 | HR 71 | Ht 64.0 in | Wt 189.0 lb

## 2018-08-22 DIAGNOSIS — M25532 Pain in left wrist: Secondary | ICD-10-CM

## 2018-08-22 DIAGNOSIS — F331 Major depressive disorder, recurrent, moderate: Secondary | ICD-10-CM

## 2018-08-22 DIAGNOSIS — M67332 Transient synovitis, left wrist: Secondary | ICD-10-CM

## 2018-08-22 DIAGNOSIS — N289 Disorder of kidney and ureter, unspecified: Secondary | ICD-10-CM | POA: Insufficient documentation

## 2018-08-22 DIAGNOSIS — G8929 Other chronic pain: Secondary | ICD-10-CM

## 2018-08-22 DIAGNOSIS — M25512 Pain in left shoulder: Secondary | ICD-10-CM | POA: Diagnosis not present

## 2018-08-22 MED ORDER — DICLOFENAC SODIUM 1 % TD GEL: 2.0000 g | Freq: Four times a day (QID) | TRANSDERMAL | 5 refills | Status: DC

## 2018-08-22 NOTE — Telephone Encounter (Signed)
Patient asked for medicine as she was leaving office, I told her choices limited due to dialysis She wants to know if you will send something in for her.

## 2018-08-22 NOTE — Addendum Note (Signed)
Addended byCandice Camp on: 08/22/2018 02:56 PM   Modules accepted: Orders

## 2018-08-22 NOTE — Telephone Encounter (Signed)
No dr Moshe Cipro gave her rx may 29

## 2018-08-22 NOTE — Patient Instructions (Signed)
Recommend occupational therapy Functional splinting Topical NSAID Voltaren gel  Left shoulder injection

## 2018-08-22 NOTE — Progress Notes (Signed)
NEW PROBLEM OFFICE VISIT  Chief Complaint  Patient presents with  . Hand Pain    left  . Arm Pain    left    63 year old female with history of fistula placed for dialysis last year started dialysis in January of this year presents with a 54-month history of dull aching constant mild to moderate pain and swelling of the left upper extremity including the left wrist hand elbow and shoulder.  She is noticed decreased strength and range of motion in the left hand including all digits.  X-ray of the left shoulder showed mild arthritis this was taken about 2 years ago  She denies any trauma   Review of Systems  Constitutional: Negative for chills and fever.  Musculoskeletal: Positive for joint pain.  Skin: Negative.   Neurological: Negative for tingling.     Past Medical History:  Diagnosis Date  . Acid reflux   . Anemia   . Anxiety    clausterophoic  . Arthritis   . Axillary masses    Soft tissue - status post excision  . Back pain   . CHF (congestive heart failure) ([place])   . Depression   . Dialysis patient ([place])   . Diastolic heart failure ([place])   . End-stage renal disease ([place])    TTHSat  in [place]  . Essential hypertension   . History of blood transfusion   . Ischemic heart disease    Abnormal Myoview April 2018 - medical therapy  . Mixed hyperlipidemia   . Obesity   . Pancreatitis   . Pneumonia 2019   two times in 2019  . Sleep apnea    Noncompliant with CPAP  . Type 2 diabetes mellitus, uncontrolled ([place])    Type II    Past Surgical History:  Procedure Laterality Date  . ABDOMINAL HYSTERECTOMY    . AV FISTULA PLACEMENT Left 09/02/2017   Procedure: creation of left arm ARTERIOVENOUS (AV) FISTULA;  Surgeon: Serafina Mitchell, MD;  Location: [facility] OR;  Service: Vascular;  Laterality: Left;  . COLONOSCOPY  2008   Dr. Oneida Alar: normal   . COLONOSCOPY N/A 12/18/2016   Dr. Oneida Alar: multiple tubular adenomas, internal hemorrhoids. Surveillance in 3 years   .  ESOPHAGEAL DILATION N/A 10/13/2015   Procedure: ESOPHAGEAL DILATION;  Surgeon: Rogene Houston, MD;  [facility];  Service: Endoscopy;  Laterality: N/A;  . ESOPHAGOGASTRODUODENOSCOPY N/A 10/13/2015   Dr. Laural Golden: chronic gastritis on path, no H.pylori. Empiric dilation   . ESOPHAGOGASTRODUODENOSCOPY N/A 12/18/2016   Dr. Oneida Alar: mild gastritis. BRAVO study revealed uncontrolled GERD. Dysphagia secondary to uncontrolled reflux  . FOOT SURGERY Bilateral    "nerve"    . LUNG BIOPSY    . MASS EXCISION Right 01/09/2013   Procedure: EXCISION OF NEOPLASM OF RIGHT  AXILLA  AND EXCISION OF NEOPLASM OF LEFT AXILLA;  Surgeon: Jamesetta So, MD;  Location: [facility] ORS;  Service: General;  Laterality: Right;  procedure end @ 08:23  . MYRINGOTOMY WITH TUBE PLACEMENT Bilateral 04/28/2017   Procedure: BILATERAL MYRINGOTOMY WITH TUBE PLACEMENT;  Surgeon: Leta Baptist, MD;  Location: [facility];  Service: ENT;  Laterality: Bilateral;  . REVISION OF ARTERIOVENOUS GORETEX GRAFT Left 05/04/2018   Procedure: TRANSPOSITION OF CEPHALIC VEIN ARTERIOVENOUS FISTULA LEFT ARM;  Surgeon: Rosetta Posner, MD;  Location: [facility];  Service: Vascular;  Laterality: Left;  . SAVORY DILATION N/A 12/18/2016   Procedure: SAVORY DILATION;  Surgeon: Danie Binder, MD;  [facility];  Service:  Endoscopy;  Laterality: N/A;    Family History  Problem Relation Age of Onset  . Hypertension Father   . Hypercholesterolemia Father   . Arthritis Father   . Hypertension Sister   . Hypercholesterolemia Sister   . Breast cancer Sister   . Hypertension Sister   . Colon cancer Neg Hx   . Colon polyps Neg Hx    Social History   Tobacco Use  . Smoking status: Never Smoker  . Smokeless tobacco: Never Used  Substance Use Topics  . Alcohol use: No  . Drug use: No    Allergies  Allergen Reactions  . Ace Inhibitors Anaphylaxis and Swelling  . Penicillins Itching, Swelling and Other (See Comments)    Did it involve swelling of the  face/tongue/throat, SOB, or low BP? Unknown Did it involve sudden or severe rash/hives, skin peeling, or any reaction on the inside of your mouth or nose? Unknown Did you need to seek medical attention at a hospital or doctor's office? Unknown When did it last happen?years  If all above answers are "NO", may proceed with cephalosporin use.   . Statins Other (See Comments)    elevated LFT's  . Albuterol Swelling    Current Meds  Medication Sig  . acetaminophen (TYLENOL) 500 MG tablet Take 500 mg by mouth every 8 (eight) hours as needed for mild pain.   Marland Kitchen allopurinol (ZYLOPRIM) 100 MG tablet Take 1 tablet (100 mg total) by mouth every other day. Please take 100 mg by mouth on Sunday, Wednesday, and Fridays (Patient taking differently: Take 100 mg by mouth 2 (two) times daily. )  . amLODipine (NORVASC) 10 MG tablet TAKE 1 TABLET BY MOUTH ONCE DAILY.  . camphor-menthol (SARNA) lotion Apply 1 application topically every 8 (eight) hours as needed for itching.  . cloNIDine (CATAPRES) 0.3 MG tablet Take 1 tablet (0.3 mg total) by mouth 2 (two) times daily.  Marland Kitchen dexlansoprazole (DEXILANT) 60 MG capsule TAKE 1 CAPSULE IN THE MORNING WITH FOOD (Patient taking differently: Take 60 mg by mouth daily with breakfast. )  . ezetimibe (ZETIA) 10 MG tablet TAKE 1 TABLET BY MOUTH ONCE DAILY. (Patient taking differently: Take 10 mg by mouth daily. )  . gabapentin (NEURONTIN) 300 MG capsule TAKE (1) CAPSULE BY MOUTH THREE TIMES DAILY  . GNP ASPIRIN LOW DOSE 81 MG EC tablet TAKE 1 TABLET BY MOUTH ONCE DAILY. (Patient taking differently: Take 81 mg by mouth daily. )  . Insulin Degludec (TRESIBA FLEXTOUCH) 200 UNIT/ML SOPN Inject 40 Units into the skin daily before breakfast.  . insulin lispro (HUMALOG KWIKPEN) 100 UNIT/ML KiwkPen INJECT 36-40 units 3x a day before meals (Patient taking differently: Inject 20-46 Units into the skin 3 (three) times daily. )  . isosorbide mononitrate (IMDUR) 30 MG 24 hr tablet TAKE  1 TABLET BY MOUTH ONCE DAILY. (Patient taking differently: Take 30 mg by mouth daily. )  . olmesartan (BENICAR) 40 MG tablet TAKE 1 TABLET BY MOUTH ONCE A DAY.  Marland Kitchen ondansetron (ZOFRAN) 4 MG tablet Take 1 tablet (4 mg total) by mouth every 8 (eight) hours as needed for nausea or vomiting.  . sertraline (ZOLOFT) 50 MG tablet TAKE 1 TABLET BY MOUTH ONCE DAILY.  . sevelamer carbonate (RENVELA) 800 MG tablet Take 800 mg by mouth 3 (three) times daily with meals.   . Skin Protectants, Misc. (WHITE PETROLATUM-ZINC) 58.3 % cream Apply topically 3 (three) times daily as needed.  . traMADol (ULTRAM) 50 MG tablet Take one tablet  two times daily, as needed, for pain (Patient taking differently: Take 50 mg by mouth 2 (two) times daily as needed for moderate pain. Take one tablet two times daily, as needed, for pain)  . ULTICARE MINI PEN NEEDLES 31G X 6 MM MISC USE AS DIRECTED    BP 113/62   Pulse 71   Ht 5\' 4"  (1.626 m)   Wt 189 lb (85.7 kg)   BMI 32.44 kg/m   Physical Exam General appearance is normal she is oriented x3 mood is pleasant her affect is flat she is walking with supportive device  Ortho Exam  She has multiple incisions over the left upper extremity secondary to fistula placement her fistula is functioning she has good color temperature left upper extremity skin shows no rash she has decreased range of motion and strength in the left hand painful range of motion and tenderness over the left wrist joint with a swollen hand and wrist she has mild tenderness in the left elbow some pain but full flexion of the left shoulder and no tenderness lymph nodes in the axilla are normal there are no sensory deficits in the left shoulder  Right shoulder Range of motion is normal no tenderness   MEDICAL DECISION SECTION  Xrays were done at [facility] see report  My independent reading of xrays:  Osteopenia throughout the hand no fracture dislocation osteoarthritis or joint space  narrowing  Encounter Diagnoses  Name Primary?  . Chronic pain of left wrist   . Transient synovitis of left wrist   . Chronic left shoulder pain Yes    PLAN: (Rx., injectx, surgery, frx, mri/ct) Recommend occupational therapy Functional splinting Topical NSAID Voltaren gel Procedure note the subacromial injection shoulder left   Verbal consent was obtained to inject the  Left   Shoulder  Timeout was completed to confirm the injection site is a subacromial space of the  left  shoulder  Medication used Depo-Medrol 40 mg and lidocaine 1% 3 cc  Anesthesia was provided by ethyl chloride  The injection was performed in the left  posterior subacromial space. After pinning the skin with alcohol and anesthetized the skin with ethyl chloride the subacromial space was injected using a 20-gauge needle. There were no complications  Sterile dressing was applied.   Follow-up as needed  Meds ordered this encounter  Medications  . diclofenac sodium (VOLTAREN) 1 % GEL    Sig: Apply 2 g topically 4 (four) times daily.    Dispense:  3 Tube    Refill:  5    Arther Abbott, MD  08/22/2018 2:46 PM

## 2018-08-22 NOTE — BH Specialist Note (Signed)
[place] Telephone Follow-up  MRN: [id number] NAME: Kathryn Beck Date: 08/22/18   Total time: 30 minutes Call number: 5/6    Reason for call today: Reason for Contact: PHQ9-12 weeks    PHQ-9 Scores:  Depression screen [facility] 2/9 08/22/2018 07/25/2018 06/28/2018 06/09/2018 06/02/2018  Decreased Interest 1 1 1  0 2  Down, Depressed, Hopeless 1 1 0 0 0  PHQ - 2 Score 2 2 1  0 2  Altered sleeping 0 2 3 3 2   Tired, decreased energy 0 1 1 3 2   Change in appetite 0 0 0 3 2  Feeling bad or failure about yourself  1 0 1 0 0  Trouble concentrating 0 0 0 1 2  Moving slowly or fidgety/restless 0 0 0 0 2  Suicidal thoughts 0 0 0 0 0  PHQ-9 Score 3 5 6 10 12   Difficult doing work/chores Not difficult at all Not difficult at all Somewhat difficult Not difficult at all -  Some recent data might be hidden     GAD-7 Scores:  GAD 7 : Generalized Anxiety Score 08/22/2018 07/25/2018 06/28/2018 05/27/2018  Nervous, Anxious, on Edge 1 1 1 2   Control/stop worrying 1 1 1 2   Worry too much - different things 0 1 1 1   Trouble relaxing 0 1 1 1   Restless 0 0 0 1  Easily annoyed or irritable 0 0 1 1  Afraid - awful might happen 1 0 1 1  Total GAD 7 Score 3 4 6 9   Anxiety Difficulty Not difficult at all Not difficult at all Somewhat difficult Somewhat difficult    Stress Current stressors: Current Stressors: (None Reported) Sleep: Sleep: No problems Appetite: Appetite: No problems Coping ability: Coping ability: Normal Patient taking medications as prescribed: Patient taking medications as prescribed: Yes    Current medications:  Outpatient Encounter Medications as of 08/22/2018  Medication Sig  . acetaminophen (TYLENOL) 500 MG tablet Take 500 mg by mouth every 8 (eight) hours as needed for mild pain.   Marland Kitchen allopurinol (ZYLOPRIM) 100 MG tablet Take 1 tablet (100 mg total) by mouth every other day. Please take 100 mg by mouth on Sunday, Wednesday, and Fridays (Patient taking differently: Take 100 mg  by mouth 2 (two) times daily. )  . amLODipine (NORVASC) 10 MG tablet TAKE 1 TABLET BY MOUTH ONCE DAILY.  . camphor-menthol (SARNA) lotion Apply 1 application topically every 8 (eight) hours as needed for itching.  . cloNIDine (CATAPRES) 0.3 MG tablet Take 1 tablet (0.3 mg total) by mouth 2 (two) times daily.  Marland Kitchen dexlansoprazole (DEXILANT) 60 MG capsule TAKE 1 CAPSULE IN THE MORNING WITH FOOD (Patient taking differently: Take 60 mg by mouth daily with breakfast. )  . diclofenac sodium (VOLTAREN) 1 % GEL Apply 2 g topically 4 (four) times daily.  Marland Kitchen ezetimibe (ZETIA) 10 MG tablet TAKE 1 TABLET BY MOUTH ONCE DAILY. (Patient taking differently: Take 10 mg by mouth daily. )  . gabapentin (NEURONTIN) 300 MG capsule TAKE (1) CAPSULE BY MOUTH THREE TIMES DAILY  . GNP ASPIRIN LOW DOSE 81 MG EC tablet TAKE 1 TABLET BY MOUTH ONCE DAILY. (Patient taking differently: Take 81 mg by mouth daily. )  . Insulin Degludec (TRESIBA FLEXTOUCH) 200 UNIT/ML SOPN Inject 40 Units into the skin daily before breakfast.  . insulin lispro (HUMALOG KWIKPEN) 100 UNIT/ML KiwkPen INJECT 36-40 units 3x a day before meals (Patient taking differently: Inject 20-46 Units into the skin 3 (three) times daily. )  .  isosorbide mononitrate (IMDUR) 30 MG 24 hr tablet TAKE 1 TABLET BY MOUTH ONCE DAILY. (Patient taking differently: Take 30 mg by mouth daily. )  . olmesartan (BENICAR) 40 MG tablet TAKE 1 TABLET BY MOUTH ONCE A DAY.  Marland Kitchen ondansetron (ZOFRAN) 4 MG tablet Take 1 tablet (4 mg total) by mouth every 8 (eight) hours as needed for nausea or vomiting.  . sertraline (ZOLOFT) 50 MG tablet TAKE 1 TABLET BY MOUTH ONCE DAILY.  . sevelamer carbonate (RENVELA) 800 MG tablet Take 800 mg by mouth 3 (three) times daily with meals.   . Skin Protectants, Misc. (WHITE PETROLATUM-ZINC) 58.3 % cream Apply topically 3 (three) times daily as needed.  . traMADol (ULTRAM) 50 MG tablet Take one tablet two times daily, as needed, for pain (Patient taking  differently: Take 50 mg by mouth 2 (two) times daily as needed for moderate pain. Take one tablet two times daily, as needed, for pain)  . ULTICARE MINI PEN NEEDLES 31G X 6 MM MISC USE AS DIRECTED  . [DISCONTINUED] FLUoxetine (PROZAC) 10 MG capsule Take 10 mg by mouth daily.    . [DISCONTINUED] glipiZIDE (GLUCOTROL) 10 MG tablet Take 10 mg by mouth 2 (two) times daily before a meal.     No facility-administered encounter medications on file as of 09/11/2018.      Self-harm Behaviors Risk Assessment Self-harm risk factors: Self-harm risk factors: (None Reported) Patient endorses recent thoughts of harming self: Have you recently had any thoughts about harming yourself?: No    [place] Suicide Severity Rating Scale: No flowsheet data found. C-SRSS 04/22/2018 05/03/2018 05/27/2018 05/27/2018 06/28/2018 Aug 14, 2018 09/11/18  1. Wish to be Dead No No No No No No No  2. Suicidal Thoughts No No No No No No No  6. Suicide Behavior Question No No No No No No No     Danger to Others Risk Assessment Danger to others risk factors: Danger to Others Risk Factors: No risk factors noted Patient endorses recent thoughts of harming others: Notification required: No need or identified person    Substance Use Assessment Patient recently consumed alcohol:  None Reported  Alcohol Use Disorder Identification Test (AUDIT):  Alcohol Use Disorder Test (AUDIT) 03/08/2018 06/28/2018 2018-08-14 09-11-18  1. How often do you have a drink containing alcohol? 0 0 0 0  2. How many drinks containing alcohol do you have on a typical day when you are drinking? 0 0 0 0  3. How often do you have six or more drinks on one occasion? 0 0 0 0  AUDIT-C Score 0 0 0 0  Alcohol Brief Interventions/Follow-up AUDIT Score <7 follow-up not indicated AUDIT Score <7 follow-up not indicated AUDIT Score <7 follow-up not indicated AUDIT Score <7 follow-up not indicated   Patient recently used drugs: None Reported    Goals, Interventions and  Follow-up Plan Goals: Increase healthy adjustment to current life circumstances Interventions: Behavioral Activation and Supportive Counseling Follow-up Plan: [facility] Phone Follow UP:  Summary:   Doreen is a 63 year old female.  Patient PHQ and GAD score remains below 5 for over a month.   Sheana reports improved mood.  Writer discussed relapse prevention techniques.  Icyss reports that due to an improved state of mind regarding her dialysis she is feeling much better physically and mentally.   Florence reports improved sleep and appetite.  Talissa reports that her sleep study went well.  Olubunmi reports that she continues to utilize coping mechanisms of being able to go for a  walk, spending time on her porch and social distancing when she goes to the grocery store.  Writer informed Dwight that if her PHQ and GAD score remains below 5 then she will be placed discharged and placed on the inactive list.   Medication: Patient reports that her psychiatric medication is working well for her.  Patient denies any negative side effects with the medication.   Patient denies SI/HI/Psychosis/Substance Abuse. If your symptoms worsen or you have thoughts of suicide/homicide, PLEASE SEEK IMMEDIATE MEDICAL ATTENTION.  You may always call:  National Suicide Hotline: 726-581-0562;  [place] Crisis Line: 705-355-0109;  Crisis Recovery in [place]: 7121294930.  These are available 24 hours a day, 7 days a week.   During the next session:  Patient will continue to utilize coping mechanisms that have been successful.  If the patient PHQ and GAD score remains below 5 then the patient will be discharged and placed on the inactive list.      Graciella Freer LaVerne, LCAS-A

## 2018-08-23 ENCOUNTER — Ambulatory Visit (INDEPENDENT_AMBULATORY_CARE_PROVIDER_SITE_OTHER): Payer: 59 | Admitting: Family Medicine

## 2018-08-23 ENCOUNTER — Encounter: Payer: Self-pay | Admitting: Family Medicine

## 2018-08-23 VITALS — BP 134/66 | HR 72 | Temp 98.6°F | Resp 12 | Ht 64.0 in | Wt 183.1 lb

## 2018-08-23 DIAGNOSIS — E1122 Type 2 diabetes mellitus with diabetic chronic kidney disease: Secondary | ICD-10-CM

## 2018-08-23 DIAGNOSIS — Z992 Dependence on renal dialysis: Secondary | ICD-10-CM | POA: Diagnosis not present

## 2018-08-23 DIAGNOSIS — N186 End stage renal disease: Secondary | ICD-10-CM | POA: Diagnosis not present

## 2018-08-23 DIAGNOSIS — M79602 Pain in left arm: Secondary | ICD-10-CM

## 2018-08-23 NOTE — Progress Notes (Signed)
Subjective:     Patient ID: Kathryn Beck, female   DOB: November 24, 1955, 63 y.o.   MRN: [id number]  Kathryn Beck presents for Diabetes (follow up) and Medication Management  Kathryn Beck is a 63 y.o. female patient of Dr Griffin Dakin, well known to the clinic.  Presents today for follow-up secondary to having her diabetic medication changed back on April 30th when I saw her. Previous visit with Dr. Moshe Cipro in March also resulted in change in medication as well.  Her blood sugar has improved since Dr Moshe Cipro and I changed her blood sugar medication. Per her reports. She does not bring her blood sugar checks log or machine in today for verification or review.  Currently, morning blood sugar: 140's maybe 150's "it varies". Also reports some continued readings under 70, but states this has improved greatly since last visit with me. She even states some weeks this does not happen. She denies any increase of low sugars on more HD days. During episodes of hypoglycemia she reports that she feels weak and dizzy.  She reports that she drinks juice or has a snack usually to get this back up.  And will check her blood sugar to make sure that her sugar has come back up. Reports that she is not eating as much as she used to eat because she has reduced appetite secondary to going on dialysis, but did start drinking Glucerna as I encouraged at last visit.   Additionally, she continues to have some left sided extremity discomfort.    She saw Dr Aline Brochure who reported this was arthritis and might have something to do with the AV graft being in that arm. Also he recommended OT, function splinting, and provided her with injection. She reports voltaren gel is not helpful, neither is tylenol.  Overall she reports that she is feeling okay.  She is taking all her medications as directed.  Has not had any trouble with them.  She denies having vision changes, headaches, chest pain, palpitations, leg swelling, chest  tightness, fevers, chills, cough, shortness of breath.  Past Medical, Surgical, Social History, Allergies, and Medications have been Reviewed.    Past Medical History:  Diagnosis Date  . Acid reflux   . Anemia   . Anxiety    clausterophoic  . Arthritis   . Axillary masses    Soft tissue - status post excision  . Back pain   . CHF (congestive heart failure) ([place])   . Depression   . Dialysis patient ([place])   . Diastolic heart failure ([place])   . End-stage renal disease ([place] )    TTHSat  in [place]  . Essential hypertension   . History of blood transfusion   . Ischemic heart disease    Abnormal Myoview April 2018 - medical therapy  . Mixed hyperlipidemia   . Obesity   . Pancreatitis   . Pneumonia 2019   two times in 2019  . Sleep apnea    Noncompliant with CPAP  . Type 2 diabetes mellitus, uncontrolled ([place])    Type II   Past Surgical History:  Procedure Laterality Date  . ABDOMINAL HYSTERECTOMY    . AV FISTULA PLACEMENT Left 09/02/2017   Procedure: creation of left arm ARTERIOVENOUS (AV) FISTULA;  Surgeon: Serafina Mitchell, MD;  Location[facility];  Service: Vascular;  Laterality: Left;  . COLONOSCOPY  2008   Dr. Oneida Alar: normal   . COLONOSCOPY N/A 12/18/2016   Dr. Oneida Alar: multiple tubular adenomas, internal  hemorrhoids. Surveillance in 3 years   . ESOPHAGEAL DILATION N/A 10/13/2015   Procedure: ESOPHAGEAL DILATION;  Surgeon: Rogene Houston, MD;  [facility];  Service: Endoscopy;  Laterality: N/A;  . ESOPHAGOGASTRODUODENOSCOPY N/A 10/13/2015   Dr. Laural Golden: chronic gastritis on path, no H.pylori. Empiric dilation   . ESOPHAGOGASTRODUODENOSCOPY N/A 12/18/2016   Dr. Oneida Alar: mild gastritis. BRAVO study revealed uncontrolled GERD. Dysphagia secondary to uncontrolled reflux  . FOOT SURGERY Bilateral    "nerve"    . LUNG BIOPSY    . MASS EXCISION Right 01/09/2013   Procedure: EXCISION OF NEOPLASM OF RIGHT  AXILLA  AND EXCISION OF NEOPLASM OF LEFT AXILLA;  Surgeon: Jamesetta So, MD;  Location: [facility] ORS;  Service: General;  Laterality: Right;  procedure end @ 08:23  . MYRINGOTOMY WITH TUBE PLACEMENT Bilateral 04/28/2017   Procedure: BILATERAL MYRINGOTOMY WITH TUBE PLACEMENT;  Surgeon: Leta Baptist, MD;  Location: [facility];  Service: ENT;  Laterality: Bilateral;  . REVISION OF ARTERIOVENOUS GORETEX GRAFT Left 05/04/2018   Procedure: TRANSPOSITION OF CEPHALIC VEIN ARTERIOVENOUS FISTULA LEFT ARM;  Surgeon: Rosetta Posner, MD;  Location: [facility];  Service: Vascular;  Laterality: Left;  . SAVORY DILATION N/A 12/18/2016   Procedure: SAVORY DILATION;  Surgeon: Danie Binder, MD;  [facility];  Service: Endoscopy;  Laterality: N/A;   Social History   Socioeconomic History  . Marital status: Married    Spouse name: larry   . Number of children: 2  . Years of education: 51  . Highest education level: 12th grade  Occupational History  . Occupation: retired   Scientific laboratory technician  . Financial resource strain: Somewhat hard  . Food insecurity:    Worry: Never true    Inability: Never true  . Transportation needs:    Medical: No    Non-medical: No  Tobacco Use  . Smoking status: Never Smoker  . Smokeless tobacco: Never Used  Substance and Sexual Activity  . Alcohol use: No  . Drug use: No  . Sexual activity: Not Currently  Lifestyle  . Physical activity:    Days per week: 0 days    Minutes per session: 0 min  . Stress: Only a little  Relationships  . Social connections:    Talks on phone: More than three times a week    Gets together: More than three times a week    Attends religious service: Never    Active member of club or organization: No    Attends meetings of clubs or organizations: Never    Relationship status: Married  . Intimate partner violence:    Fear of current or ex partner: No    Emotionally abused: No    Physically abused: No    Forced sexual activity: No  Other Topics Concern  . Not on file  Social History Narrative   Lives alone with  husband     Outpatient Encounter Medications as of 08/23/2018  Medication Sig  . acetaminophen (TYLENOL) 500 MG tablet Take 500 mg by mouth every 8 (eight) hours as needed for mild pain.   Marland Kitchen allopurinol (ZYLOPRIM) 100 MG tablet Take 1 tablet (100 mg total) by mouth every other day. Please take 100 mg by mouth on Sunday, Wednesday, and Fridays (Patient taking differently: Take 100 mg by mouth 2 (two) times daily. )  . amLODipine (NORVASC) 10 MG tablet TAKE 1 TABLET BY MOUTH ONCE DAILY.  . camphor-menthol (SARNA) lotion Apply 1 application topically every 8 (  eight) hours as needed for itching.  . cloNIDine (CATAPRES) 0.3 MG tablet Take 1 tablet (0.3 mg total) by mouth 2 (two) times daily.  Marland Kitchen dexlansoprazole (DEXILANT) 60 MG capsule TAKE 1 CAPSULE IN THE MORNING WITH FOOD (Patient taking differently: Take 60 mg by mouth daily with breakfast. )  . diclofenac sodium (VOLTAREN) 1 % GEL Apply 2 g topically 4 (four) times daily.  Marland Kitchen ezetimibe (ZETIA) 10 MG tablet TAKE 1 TABLET BY MOUTH ONCE DAILY. (Patient taking differently: Take 10 mg by mouth daily. )  . gabapentin (NEURONTIN) 300 MG capsule TAKE (1) CAPSULE BY MOUTH THREE TIMES DAILY  . GNP ASPIRIN LOW DOSE 81 MG EC tablet TAKE 1 TABLET BY MOUTH ONCE DAILY. (Patient taking differently: Take 81 mg by mouth daily. )  . Insulin Degludec (TRESIBA FLEXTOUCH) 200 UNIT/ML SOPN Inject 40 Units into the skin daily before breakfast.  . insulin lispro (HUMALOG KWIKPEN) 100 UNIT/ML KiwkPen INJECT 36-40 units 3x a day before meals (Patient taking differently: Inject 20-46 Units into the skin 3 (three) times daily. )  . isosorbide mononitrate (IMDUR) 30 MG 24 hr tablet TAKE 1 TABLET BY MOUTH ONCE DAILY. (Patient taking differently: Take 30 mg by mouth daily. )  . olmesartan (BENICAR) 40 MG tablet TAKE 1 TABLET BY MOUTH ONCE A DAY.  Marland Kitchen ondansetron (ZOFRAN) 4 MG tablet Take 1 tablet (4 mg total) by mouth every 8 (eight) hours as needed for nausea or vomiting.  .  sertraline (ZOLOFT) 50 MG tablet TAKE 1 TABLET BY MOUTH ONCE DAILY.  . sevelamer carbonate (RENVELA) 800 MG tablet Take 800 mg by mouth 3 (three) times daily with meals.   . Skin Protectants, Misc. (WHITE PETROLATUM-ZINC) 58.3 % cream Apply topically 3 (three) times daily as needed.  . traMADol (ULTRAM) 50 MG tablet Take one tablet two times daily, as needed, for pain (Patient taking differently: Take 50 mg by mouth 2 (two) times daily as needed for moderate pain. Take one tablet two times daily, as needed, for pain)  . ULTICARE MINI PEN NEEDLES 31G X 6 MM MISC USE AS DIRECTED  . [DISCONTINUED] FLUoxetine (PROZAC) 10 MG capsule Take 10 mg by mouth daily.    . [DISCONTINUED] glipiZIDE (GLUCOTROL) 10 MG tablet Take 10 mg by mouth 2 (two) times daily before a meal.     No facility-administered encounter medications on file as of 08/23/2018.    Allergies  Allergen Reactions  . Ace Inhibitors Anaphylaxis and Swelling  . Penicillins Itching, Swelling and Other (See Comments)    Did it involve swelling of the face/tongue/throat, SOB, or low BP? Unknown Did it involve sudden or severe rash/hives, skin peeling, or any reaction on the inside of your mouth or nose? Unknown Did you need to seek medical attention at a hospital or doctor's office? Unknown When did it last happen?years  If all above answers are "NO", may proceed with cephalosporin use.   . Statins Other (See Comments)    elevated LFT's  . Albuterol Swelling    Review of Systems  Constitutional: Negative for activity change and appetite change.  HENT: Negative.   Eyes: Negative for visual disturbance.  Respiratory: Negative for cough and shortness of breath.   Cardiovascular: Negative for chest pain and leg swelling.  Gastrointestinal: Negative.   Endocrine: Negative for polydipsia, polyphagia and polyuria.  Genitourinary: Negative.   Musculoskeletal: Positive for arthralgias.       Left arm pain   Skin: Negative.    Allergic/Immunologic: Negative.  Neurological: Negative for dizziness and headaches.  Hematological: Negative.   Psychiatric/Behavioral: Negative.   All other systems reviewed and are negative.      Objective:     BP 134/66   Pulse 72   Temp 98.6 F (37 C) (Oral)   Resp 12   Ht 5\' 4"  (1.626 m)   Wt 183 lb 1.3 oz (83 kg)   SpO2 98%   BMI 31.43 kg/m   Physical Exam Vitals signs and nursing note reviewed.  Constitutional:      General: She is awake.     Appearance: Normal appearance. She is well-developed and well-groomed. She is obese.  HENT:     Head: Normocephalic and atraumatic.     Right Ear: External ear normal.     Left Ear: External ear normal.     Nose: Nose normal.  Eyes:     General: No scleral icterus.       Right eye: No discharge.        Left eye: No discharge.     Conjunctiva/sclera: Conjunctivae normal.  Neck:     Musculoskeletal: Normal range of motion.  Cardiovascular:     Rate and Rhythm: Normal rate and regular rhythm.     Pulses: Normal pulses.     Heart sounds: Normal heart sounds.     Arteriovenous access: left arteriovenous access is present.    Comments: Left upper arm AV fistula positive positive receive dialysis today Pulmonary:     Effort: Pulmonary effort is normal.     Breath sounds: Normal breath sounds.  Abdominal:     General: Bowel sounds are normal.  Musculoskeletal: Normal range of motion.  Skin:    General: Skin is warm and dry.  Neurological:     Mental Status: She is alert and oriented to person, place, and time.  Psychiatric:        Mood and Affect: Mood normal.        Behavior: Behavior normal. Behavior is cooperative.        Thought Content: Thought content normal.        Judgment: Judgment normal.        Assessment and Plan        1. Type 2 diabetes mellitus with ESRD (end-stage renal disease) ([place]) Questionable control at this time secondary to asked still experiencing some episodes of hypoglycemia, and  elevation of blood sugars in the 200s.  Last A1c was 6.9% in March.  She does have follow-up with endocrinology in a week appreciate collaboration in her care.  At last visit we had reduced her Tresiba to 40 units, keeping her sliding scale has it was currently ordered.  Advised her to get Glucerna to help supplement the days that she was not eating much as this might be possible because of her constant drop when she was experiencing.  Now she reports that she is only experiencing 1-2 times of hypoglycemia sometimes not at all.  It is greatly improved with the reduction in the [place].  Told her that she was supposed to bring a record of the last several weeks of her blood sugar checks, but she forgot to do this.  Due to that and the nature that she reports that she is feeling much better not having as many hypoglycemic episodes.  We will continue the current medication regime and allow endocrinology to decide if they want to further reduce the Tresiba or adjust her sliding scale. Patient acknowledged agreement and understanding of the plan.  2. Pain of left upper extremity She continues to have ongoing upper arm extremity pain.  Dr. Aline Brochure reported that she has some arthritis.  Additionally this is her arm that she gets her dialysis on as well.  And that might be part of the issue.  She is encouraged to take Tylenol, use a heating pad, possibly use Ultram that Dr. Moshe Cipro a given her previously.  And continue to take the gabapentin as directed.  Due to her kidney reduction infiltration unable to prescribe certain pain medications at this time.  Would like to avoid narcotics if possible. Patient acknowledged agreement and understanding of the plan.    3. ESRD on dialysis ([place]) Stable, reports that she is doing well in dialysis had dialysis this morning.  Is not having any trouble tolerating the dialysis.  But reports that she is not having as much hunger and sometimes feels sick postdialysis.  She  reports that this is feeling much better since her blood sugars got under control.  And that she is taking in Glucerna.  Hopefully in the next coming months this will reset allow even better.  As she has lost a pretty good amount of weight in the last 6 months to a year.  Follow-up: 3 months  Perlie Mayo, DNP, AGNP-BC [address] [place], [place] [address] Office Hours: Mon-Thurs 8 am-5 pm; Fri 8 am-12 pm Office Phone:  [phone]  Office Fax: [phone]

## 2018-08-23 NOTE — Patient Instructions (Addendum)
    Thank you for coming into the office today. I appreciate the opportunity to provide you with the care for your health and wellness. Today we discussed: diabetic   You are still experiencing some low blood sugars.  Ideally we would like you do not have any.  I reduced your Tyler Aas to 40 units, at the last visit. Please continue this. I will look for Endo to review and make other suggestions or changes as needed.  We need to make sure that you are having adequate calorie/carbohydrate intake while you are on these medications.    Please continue to take your blood sugar 4 times daily.  Please try to consume Glucerna, if you need to supplement due to reduced appetite.  Please continue your sliding scale coverage at this time.  Please keep follow-up with endocrine office.    Goal for fasting blood sugar ranges from 80 to 120 and 2 hours after any meal or at bedtime should be between 130 to 170.  We will see you back in 3 months.  Continue to practice social distancing. Stay safe.    [place] YOUR HANDS WELL AND FREQUENTLY. AVOID TOUCHING YOUR FACE, UNLESS YOUR HANDS ARE FRESHLY WASHED.  GET FRESH AIR DAILY. STAY HYDRATED WITH WATER.   It was a pleasure to see you and I look forward to continuing to work together on your health and well-being. Please do not hesitate to call the office if you need care or have questions about your care.  Have a wonderful day and week.  With Gratitude,  Cherly Beach, DNP, AGNP-BC

## 2018-08-24 NOTE — Progress Notes (Signed)
[facility] Psychiatric Consultant Case Review   Assessment # MDD  There has been steady improvement in depression/anxiety. Discussed relapse prevention.    Recommendation - She is on sertraline 50 mg daily.  Would recommend continue current sertraline at least for 6-12  months to avoid relapse in mood symptoms if the patient has less than two depressive episodes. It is generally advised to maintain treatment indefinitely if the patient has history of more than three depressive episodes.   Thank you for your consult. We will sign off. Please contact [place]  for any questions or concerns.   The above treatment considerations and suggestions are based on consultation with the [facility] specialist and/or PCP and a review of information available in the shared registry and the patient's [place] Record (EHR). I have not personally examined the patient. All recommendations should be implemented with consideration of the patient's relevant prior history and current clinical status. Please feel free to call me with any questions about the care of this patient.

## 2018-08-26 ENCOUNTER — Other Ambulatory Visit: Payer: Self-pay | Admitting: Family Medicine

## 2018-08-31 ENCOUNTER — Other Ambulatory Visit: Payer: Self-pay

## 2018-08-31 ENCOUNTER — Ambulatory Visit (INDEPENDENT_AMBULATORY_CARE_PROVIDER_SITE_OTHER): Payer: 59 | Admitting: Internal Medicine

## 2018-08-31 ENCOUNTER — Encounter: Payer: Self-pay | Admitting: Internal Medicine

## 2018-08-31 ENCOUNTER — Ambulatory Visit: Payer: Self-pay | Admitting: Family Medicine

## 2018-08-31 VITALS — BP 122/80 | HR 74 | Ht 64.0 in | Wt 215.0 lb

## 2018-08-31 DIAGNOSIS — N186 End stage renal disease: Secondary | ICD-10-CM

## 2018-08-31 DIAGNOSIS — E1122 Type 2 diabetes mellitus with diabetic chronic kidney disease: Secondary | ICD-10-CM

## 2018-08-31 DIAGNOSIS — E782 Mixed hyperlipidemia: Secondary | ICD-10-CM

## 2018-08-31 DIAGNOSIS — D3500 Benign neoplasm of unspecified adrenal gland: Secondary | ICD-10-CM

## 2018-08-31 LAB — POCT GLYCOSYLATED HEMOGLOBIN (HGB A1C): Hemoglobin A1C: 6.8 % — AB (ref 4.0–5.6)

## 2018-08-31 MED ORDER — INSULIN LISPRO 200 UNIT/ML ~~LOC~~ SOPN: 18.0000 [IU] | PEN_INJECTOR | Freq: Three times a day (TID) | SUBCUTANEOUS | 5 refills | Status: DC

## 2018-08-31 NOTE — Addendum Note (Signed)
Addended by: Cardell Peach I on: 08/31/2018 09:35 AM   Modules accepted: Orders

## 2018-08-31 NOTE — Progress Notes (Signed)
Patient ID: Kathryn Beck, female   DOB: 1956-03-04, 63 y.o.   MRN: [id number]  HPI: Kathryn Beck is a 63 y.o.-year-old female, initially referred by her PCP, Dr. Moshe Beck, returning for follow-up for of DM2, dx in 2008, insulin-dependent since ~2015, uncontrolled, with complications (CKD stage 4, PN). She saw Dr. Dorris Beck - last OV with him 05/2016.  Last visit with me 9 months ago.  Since last visit she started hemodialysis.  She was planning to get a gastric bypass but the plans are on hold for now.  Sugars started to improve after she started hemodialysis to the point of lows.  Her long-acting insulin dose was decreased to 50%.  Humalog dose remains the same, but she tells me that she is not taking this consistently, only when her sugars are high.  As a consequence, her blood sugars fluctuate significantly throughout the day.  Adrenal masses.  05/28/2017: MRI abd: Bilateral adrenal nodules with signal dropout on out of phase imaging, consistent with adenomas.   Example at 2.5 cm on the left and on the order of 1.6 cm on the right.  10/10/2015: CT abdomen and pelvis without contrast: Stable 2.2 cm left adrenal nodule with Hounsfield unit measurements 28 unchanged likely a lipid poor adenoma  08/23/2008: MRI of the abdomen with and without contrast: Left adrenal mass measuring 1.6 x 1.5 cm (previously measuring 1.4 x 1.6 cm on the CT scan from 2003 -stable in size),  Of note, she has an enlarging pancreatic head mass - pt reports a h/o pancreatitis. (This will need to be eval. By GI)  DM2: Last hemoglobin A1c was: Lab Results  Component Value Date   HGBA1C 6.9 (H) 06/09/2018   HGBA1C 8.4 (H) 03/08/2018   HGBA1C 8.3 (H) 11/12/2017   Pt was on a regimen of: - Humalog 75/25 20-26 units before each meal, 2-3X a day depending on the CBG before the meal  - Levemir 100 units at bedtime  Then on: - Levemir 50 units x2 injections at bedtime  >> 50 units 2x a day (changed inhouse) >> Tresiba U200  40 units twice a day >> 40 units once a day - Humalog 3x a day 15 min before a meal: -32 units before a smaller meal -36 units before a larger meal  Pt checks her sugars 1-3 times a day per review of her log: - am: 232-493 >> 93-336 >> 64-322, 356 - 2h after b'fast: n/c >> 202, 296, 462 >> n/c  - before lunch:  164, 181, 487 >> 31, 89-127 >> 55, 89-356, HI - 2h after lunch: n/c - before dinner:598 >> 280-436 >> 217, 300 >> 64-288, 340 - 2h after dinner: n/c - bedtime:422, 448 >> 167-242, 362 >> 90-207 - nighttime: n/c Lowest sugar was 164 >> 31 (???) >> 35 before decreasing the dose, now 55;  it is unclear at which level she has hypoglycemia awareness Highest sugar was 487 >> 362 >> HI  Glucometer: Accuchek  Pt's meals are: - Breakfast: eggs, bacon, oatmeal, grits - Lunch: salads - Dinner: meat + veggie or sandwich + salad or nabs - Snacks: nabs, fruit cups, jello  -+ Stage IV CKD (seen Kathryn Beck), last BUN/creatinine:  Lab Results  Component Value Date   BUN 16 04/22/2018   BUN 78 (H) 02/06/2018   CREATININE 3.49 (H) 04/22/2018   CREATININE 4.69 (H) 02/06/2018  Off olmesartan. -+ HL; last set of lipids: Lab Results  Component Value Date   CHOL 199  03/08/2018   HDL 37 (L) 03/08/2018   LDLCALC 114 (H) 03/08/2018   LDLDIRECT 107 05/19/2016   TRIG 242 (H) 03/08/2018   CHOLHDL 5.4 03/08/2018  On Zetia 10, Off Lipitor 40 - last eye exam was on 08/2017: Reportedly no DR. -No numbness but she has tingling in her feet. On Neurontin.  She also has HTN.  ROS: Constitutional: + weight gain/no weight loss, no fatigue, no subjective hyperthermia, no subjective hypothermia Eyes: no blurry vision, no xerophthalmia ENT: no sore throat, no nodules palpated in neck, no dysphagia, no odynophagia, no hoarseness Cardiovascular: no CP/no SOB/no palpitations/+ leg swelling Respiratory: no cough/no SOB/no wheezing Gastrointestinal: no N/no V/no D/no C/no acid reflux Musculoskeletal:  no muscle aches/+ joint aches Skin: no rashes, no hair loss Neurological: no tremors/no numbness/no tingling/no dizziness  I reviewed pt's medications, allergies, PMH, social hx, family hx, and changes were documented in the history of present illness. Otherwise, unchanged from my initial visit note.  Past Medical History:  Diagnosis Date  . Acid reflux   . Anemia   . Anxiety    clausterophoic  . Arthritis   . Axillary masses    Soft tissue - status post excision  . Back pain   . CHF (congestive heart failure) ([place])   . Depression   . Dialysis patient ([place])   . Diastolic heart failure ([place])   . End-stage renal disease ([place])    TTHSat  in [place]  . Essential hypertension   . History of blood transfusion   . Ischemic heart disease    Abnormal Myoview April 2018 - medical therapy  . Mixed hyperlipidemia   . Obesity   . Pancreatitis   . Pneumonia 2019   two times in 2019  . Sleep apnea    Noncompliant with CPAP  . Type 2 diabetes mellitus, uncontrolled ([place])    Type II   Past Surgical History:  Procedure Laterality Date  . ABDOMINAL HYSTERECTOMY    . AV FISTULA PLACEMENT Left 09/02/2017   Procedure: creation of left arm ARTERIOVENOUS (AV) FISTULA;  Surgeon: Kathryn Mitchell, MD;  [facility];  Service: Vascular;  Laterality: Left;  . COLONOSCOPY  2008   Dr. Oneida Beck: normal   . COLONOSCOPY N/A 12/18/2016   Dr. Oneida Beck: multiple tubular adenomas, internal hemorrhoids. Surveillance in 3 years   . ESOPHAGEAL DILATION N/A 10/13/2015   Procedure: ESOPHAGEAL DILATION;  Surgeon: Kathryn Houston, MD;  Location: [facility] SUITE;  Service: Endoscopy;  Laterality: N/A;  . ESOPHAGOGASTRODUODENOSCOPY N/A 10/13/2015   Dr. Laural Beck: chronic gastritis on path, no H.pylori. Empiric dilation   . ESOPHAGOGASTRODUODENOSCOPY N/A 12/18/2016   Dr. Oneida Beck: mild gastritis. BRAVO study revealed uncontrolled GERD. Dysphagia secondary to uncontrolled reflux  . FOOT SURGERY Bilateral    "nerve"    .  LUNG BIOPSY    . MASS EXCISION Right 01/09/2013   Procedure: EXCISION OF NEOPLASM OF RIGHT  AXILLA  AND EXCISION OF NEOPLASM OF LEFT AXILLA;  Surgeon: Jamesetta So, MD;  Location: [facility] ORS;  Service: General;  Laterality: Right;  procedure end @ 08:23  . MYRINGOTOMY WITH TUBE PLACEMENT Bilateral 04/28/2017   Procedure: BILATERAL MYRINGOTOMY WITH TUBE PLACEMENT;  Surgeon: Leta Baptist, MD;  Location: [facility];  Service: ENT;  Laterality: Bilateral;  . REVISION OF ARTERIOVENOUS GORETEX GRAFT Left 05/04/2018   Procedure: TRANSPOSITION OF CEPHALIC VEIN ARTERIOVENOUS FISTULA LEFT ARM;  Surgeon: Rosetta Posner, MD;  Location: [facility];  Service: Vascular;  Laterality: Left;  . SAVORY  DILATION N/A 12/18/2016   Procedure: SAVORY DILATION;  Surgeon: Danie Binder, MD;  [facility];  Service: Endoscopy;  Laterality: N/A;   Social History   Socioeconomic History  . Marital status: Married    Spouse name: Not on file  . Number of children: 2  Occupational History  . CNA  Tobacco Use  . Smoking status: Never Smoker  . Smokeless tobacco: Never Used  Substance and Sexual Activity  . Alcohol use: No  . Drug use: No   Current Outpatient Medications on File Prior to Visit  Medication Sig Dispense Refill  . acetaminophen (TYLENOL) 500 MG tablet Take 500 mg by mouth every 8 (eight) hours as needed for mild pain.     Marland Kitchen allopurinol (ZYLOPRIM) 100 MG tablet Take 1 tablet (100 mg total) by mouth every other day. Please take 100 mg by mouth on Sunday, Wednesday, and Fridays (Patient taking differently: Take 100 mg by mouth 2 (two) times daily. ) 30 tablet 6  . amLODipine (NORVASC) 10 MG tablet TAKE 1 TABLET BY MOUTH ONCE DAILY. 28 tablet 10  . camphor-menthol (SARNA) lotion Apply 1 application topically every 8 (eight) hours as needed for itching. 222 mL 0  . cloNIDine (CATAPRES) 0.3 MG tablet Take 1 tablet (0.3 mg total) by mouth 2 (two) times daily. 180 tablet 1  . dexlansoprazole (DEXILANT) 60 MG capsule  TAKE 1 CAPSULE IN THE MORNING WITH FOOD (Patient taking differently: Take 60 mg by mouth daily with breakfast. ) 28 capsule 10  . diclofenac sodium (VOLTAREN) 1 % GEL Apply 2 g topically 4 (four) times daily. 3 Tube 5  . ezetimibe (ZETIA) 10 MG tablet TAKE 1 TABLET BY MOUTH ONCE DAILY. (Patient taking differently: Take 10 mg by mouth daily. ) 28 tablet 5  . gabapentin (NEURONTIN) 300 MG capsule TAKE (1) CAPSULE BY MOUTH THREE TIMES DAILY 84 capsule 4  . GNP ASPIRIN LOW DOSE 81 MG EC tablet TAKE 1 TABLET BY MOUTH ONCE DAILY. (Patient taking differently: Take 81 mg by mouth daily. ) 28 tablet 10  . Insulin Degludec (TRESIBA FLEXTOUCH) 200 UNIT/ML SOPN Inject 40 Units into the skin daily before breakfast.    . insulin lispro (HUMALOG KWIKPEN) 100 UNIT/ML KiwkPen INJECT 36-40 units 3x a day before meals (Patient taking differently: Inject 20-46 Units into the skin 3 (three) times daily. ) 90 mL 3  . isosorbide mononitrate (IMDUR) 30 MG 24 hr tablet TAKE 1 TABLET BY MOUTH ONCE DAILY. (Patient taking differently: Take 30 mg by mouth daily. ) 28 tablet 5  . olmesartan (BENICAR) 40 MG tablet TAKE 1 TABLET BY MOUTH ONCE A DAY. 30 tablet 0  . ondansetron (ZOFRAN) 4 MG tablet Take 1 tablet (4 mg total) by mouth every 8 (eight) hours as needed for nausea or vomiting. 30 tablet 1  . sertraline (ZOLOFT) 50 MG tablet TAKE 1 TABLET BY MOUTH ONCE DAILY. 28 tablet 4  . sevelamer carbonate (RENVELA) 800 MG tablet Take 800 mg by mouth 3 (three) times daily with meals.   6  . Skin Protectants, Misc. (WHITE PETROLATUM-ZINC) 58.3 % cream Apply topically 3 (three) times daily as needed. 60 g 0  . traMADol (ULTRAM) 50 MG tablet Take one tablet two times daily, as needed, for pain (Patient taking differently: Take 50 mg by mouth 2 (two) times daily as needed for moderate pain. Take one tablet two times daily, as needed, for pain) 40 tablet 5  . ULTICARE MINI PEN NEEDLES 31G  X 6 MM MISC USE AS DIRECTED 100 each 10  .  [DISCONTINUED] FLUoxetine (PROZAC) 10 MG capsule Take 10 mg by mouth daily.      . [DISCONTINUED] glipiZIDE (GLUCOTROL) 10 MG tablet Take 10 mg by mouth 2 (two) times daily before a meal.       No current facility-administered medications on file prior to visit.    Allergies  Allergen Reactions  . Ace Inhibitors Anaphylaxis and Swelling  . Penicillins Itching, Swelling and Other (See Comments)    Did it involve swelling of the face/tongue/throat, SOB, or low BP? Unknown Did it involve sudden or severe rash/hives, skin peeling, or any reaction on the inside of your mouth or nose? Unknown Did you need to seek medical attention at a hospital or doctor's office? Unknown When did it last happen?years  If all above answers are "NO", may proceed with cephalosporin use.   . Statins Other (See Comments)    elevated LFT's  . Albuterol Swelling   Family History  Problem Relation Age of Onset  . Hypertension Father   . Hypercholesterolemia Father   . Arthritis Father   . Hypertension Sister   . Hypercholesterolemia Sister   . Breast cancer Sister   . Hypertension Sister   . Colon cancer Neg Hx   . Colon polyps Neg Hx     PE: BP 122/80   Pulse 74   Ht 5\' 4"  (1.626 m)   Wt 215 lb (97.5 kg)   SpO2 97%   BMI 36.90 kg/m  Wt Readings from Last 3 Encounters:  08/31/18 215 lb (97.5 kg)  08/23/18 183 lb 1.3 oz (83 kg)  08/22/18 189 lb (85.7 kg)   Constitutional: overweight, in NAD, appears in anasarca Eyes: PERRLA, EOMI, no exophthalmos ENT: moist mucous membranes, no thyromegaly, no cervical lymphadenopathy Cardiovascular: RRR, No MRG, + leg swelling bilaterally, pitting Respiratory: CTA B Gastrointestinal: abdomen soft, NT, ND, BS+ Musculoskeletal: no deformities, strength intact in all 4 Skin: moist, warm, no rashes Neurological: no tremor with outstretched hands, DTR normal in all 4  ASSESSMENT: 1. DM2, insulin-dependent, uncontrolled, with complications - CKD stage 4 -  PN  2. HL  3. B adrenal adenomas  PLAN:  1. Patient with longstanding, uncontrolled, type 2 diabetes, previously on a premixed insulin regimen and also long-acting insulin, change to basal/bolus insulin regimen.  At last visit, she was missing insulin doses and we discussed about taking this before every single meal.  We also increased her Humalog doses and switched from [place] to [place] for better side absorption since she is taking a high dose of basal insulin.  At last visit, HbA1c was lower than before, but since then, she had an excellent improvement in her HbA1c in 05/2018: 6.9%.  At that time, however she was having low blood sugars episodes and PCP decreased her Tresiba dose to only 40 units daily.  No lows since then -At this visit, sugars are very fluctuating and I suspect that this is due to the fact that she is not taking her Humalog consistently.  If her sugars are at or slightly above goal, she is not taking Humalog at all so the next CBG will be in the 200s or 300s.  We discussed that she most likely needs lower doses of Humalog but she needs to take these consistently, whenever she eats.  However, if the sugars are lower than 60, I advised her to hold Humalog before that meal.  For now, we will continue  with the same dose of Tresiba but I advised her to take this after dialysis.  She was taking this before dialysis in the past. -HbA1c is better, at 6.8% - I suggested to:  Patient Instructions  Please continue: - Tresiba 40 units daily after dialysis  Please decrease: - Humalog 18 units before a smaller meal - Humalog 22 units before a larger meal Please do not take Humalog if sugars before a meal are <60. Otherwise, take it whenever you eat.  Please return in 1.5 months with your sugar log.   - continue checking sugars at different times of the day - check 4x a day, rotating checks - advised for yearly eye exams >> she is UTD - Return to clinic in 1.5 mo with sugar  log     2. HL - Reviewed latest lipid panel from 02/2018: LDL above target, triglycerides high  Lab Results  Component Value Date   CHOL 199 03/08/2018   HDL 37 (L) 03/08/2018   LDLCALC 114 (H) 03/08/2018   LDLDIRECT 107 05/19/2016   TRIG 242 (H) 03/08/2018   CHOLHDL 5.4 03/08/2018  - Continues Zetia and Lipitor without side effects.  3. B adrenal adenomas -Patient with history of bilateral adrenal adenoma per review of her  abdominal MRI from 2019.  The imaging characteristics pointed towards benign tumors, of 2.5 to 1.6 cm, respectively.  However, they have appeared to increase in size over time. Reviewing the MRI of the abdomen from 2010 and the CT of the abdomen and pelvis from 2017, only one nodule was seen then, on the left adrenal. -The adenomas do  not appear to have excess hormone production per labs performed last year: Catecholamines, metanephrines, aldosterone normal.  She did not come back for a dexamethasone suppression test since last visit, but she presented earlier today for this. -At next visit, we will check blood work for her adrenal adenoma again.  We discussed that we need to repeat hormonal testing for a total of 5 years.  [name], MD PhD [facility] Endocrinology

## 2018-08-31 NOTE — Patient Instructions (Signed)
Please continue: - Tresiba 40 units daily after dialysis  Please decrease: - Humalog 18 units before a smaller meal - Humalog 22 units before a larger meal Please do not take Humalog if sugars before a meal are <60. Otherwise, take it whenever you eat.  Please return in 1.5 months with your sugar log.

## 2018-09-02 ENCOUNTER — Other Ambulatory Visit: Payer: Self-pay

## 2018-09-02 ENCOUNTER — Encounter: Payer: Self-pay | Admitting: Family Medicine

## 2018-09-02 ENCOUNTER — Ambulatory Visit (INDEPENDENT_AMBULATORY_CARE_PROVIDER_SITE_OTHER): Payer: 59 | Admitting: Family Medicine

## 2018-09-02 VITALS — BP 140/62 | HR 83 | Temp 98.3°F | Resp 12

## 2018-09-02 DIAGNOSIS — T783XXA Angioneurotic edema, initial encounter: Secondary | ICD-10-CM | POA: Diagnosis not present

## 2018-09-02 DIAGNOSIS — B370 Candidal stomatitis: Secondary | ICD-10-CM

## 2018-09-02 MED ORDER — NYSTATIN 100000 UNIT/ML MT SUSP: 5.0000 mL | Freq: Four times a day (QID) | OROMUCOSAL | 0 refills | Status: DC

## 2018-09-02 MED ORDER — METHYLPREDNISOLONE SODIUM SUCC 125 MG IJ SOLR: 60.0000 mg | Freq: Once | INTRAMUSCULAR | Status: DC

## 2018-09-02 MED ORDER — METHYLPREDNISOLONE ACETATE 80 MG/ML IJ SUSP
60.0000 mg | Freq: Once | INTRAMUSCULAR | Status: AC
  Administered 2018-09-02: 60 mg via INTRAMUSCULAR

## 2018-09-02 NOTE — Patient Instructions (Signed)
    Thank you for coming into the office today. I appreciate the opportunity to provide you with the care for your health and wellness. Today we discussed:      [place] YOUR HANDS WELL AND FREQUENTLY. AVOID TOUCHING YOUR FACE, UNLESS YOUR HANDS ARE FRESHLY WASHED.  GET FRESH AIR DAILY. STAY HYDRATED WITH WATER.   It was a pleasure to see you and I look forward to continuing to work together on your health and well-being. Please do not hesitate to call the office if you need care or have questions about your care.  Have a wonderful day and week.  With Gratitude,  Cherly Beach, DNP, AGNP-BC

## 2018-09-02 NOTE — Progress Notes (Signed)
Subjective:     Patient ID: Kathryn Beck, female   DOB: 02-23-1956, 63 y.o.   MRN: [id number]  Kathryn Beck presents for Sore Throat (brown yellow film over tongue, tongue seems a little swollen, able to talk without problems)  Kathryn Beck is a 63 year old female today who presents because her tongue has been swollen.  And is causing her to have problems talking.  She has dialysis here in a couple hours.  And is wanting to avoid going to the emergency room.  Reports that she has had this issue in the past.  Was given steroids and it made it better.   She has not had any issues or concerns prior to this.  She reports that she has been trying to use Biotene to rinse out her mouth.  She has been doing good hygiene brushing her teeth.  She is unsure of what caused it.  She denies having any changes in her diet or anything that she is ingested that could have caused it.  Reports that she can get Arrien without issue.  Does not feel like she has any compromising her airway.  She denies having any changes in activity, appetite, fever, chills, coughing, shortness of breath that is outside of her baseline as she does receive dialysis.  Denies having chest pain, leg swelling, palpitations.  Denies having any dizziness headaches or vision changes.   Past Medical, Surgical, Social History, Allergies, and Medications have been Reviewed.   Past Medical History:  Diagnosis Date  . Acid reflux   . Anemia   . Anxiety    clausterophoic  . Arthritis   . Axillary masses    Soft tissue - status post excision  . Back pain   . CHF (congestive heart failure) ([place])   . Depression   . Dialysis patient ([place])   . Diastolic heart failure ([place])   . End-stage renal disease ([place])    TTHSat  in [place]  . Essential hypertension   . History of blood transfusion   . Ischemic heart disease    Abnormal Myoview April 2018 - medical therapy  . Mixed hyperlipidemia   . Obesity   . Pancreatitis   . Pneumonia  2019   two times in 2019  . Sleep apnea    Noncompliant with CPAP  . Type 2 diabetes mellitus, uncontrolled ([place])    Type II   Past Surgical History:  Procedure Laterality Date  . ABDOMINAL HYSTERECTOMY    . AV FISTULA PLACEMENT Left 09/02/2017   Procedure: creation of left arm ARTERIOVENOUS (AV) FISTULA;  Surgeon: Serafina Mitchell, MD;  Location: [facility] OR;  Service: Vascular;  Laterality: Left;  . COLONOSCOPY  2008   Dr. Oneida Alar: normal   . COLONOSCOPY N/A 12/18/2016   Dr. Oneida Alar: multiple tubular adenomas, internal hemorrhoids. Surveillance in 3 years   . ESOPHAGEAL DILATION N/A 10/13/2015   Procedure: ESOPHAGEAL DILATION;  Surgeon: Rogene Houston, MD;  Location: [facility] SUITE;  Service: Endoscopy;  Laterality: N/A;  . ESOPHAGOGASTRODUODENOSCOPY N/A 10/13/2015   Dr. Laural Golden: chronic gastritis on path, no H.pylori. Empiric dilation   . ESOPHAGOGASTRODUODENOSCOPY N/A 12/18/2016   Dr. Oneida Alar: mild gastritis. BRAVO study revealed uncontrolled GERD. Dysphagia secondary to uncontrolled reflux  . FOOT SURGERY Bilateral    "nerve"    . LUNG BIOPSY    . MASS EXCISION Right 01/09/2013   Procedure: EXCISION OF NEOPLASM OF RIGHT  AXILLA  AND EXCISION OF NEOPLASM OF LEFT AXILLA;  Surgeon:  Jamesetta So, MD;  Location: [facility] ORS;  Service: General;  Laterality: Right;  procedure end @ 08:23  . MYRINGOTOMY WITH TUBE PLACEMENT Bilateral 04/28/2017   Procedure: BILATERAL MYRINGOTOMY WITH TUBE PLACEMENT;  Surgeon: Leta Baptist, MD;  Location: [facility];  Service: ENT;  Laterality: Bilateral;  . REVISION OF ARTERIOVENOUS GORETEX GRAFT Left 05/04/2018   Procedure: TRANSPOSITION OF CEPHALIC VEIN ARTERIOVENOUS FISTULA LEFT ARM;  Surgeon: Rosetta Posner, MD;  Location: [facility];  Service: Vascular;  Laterality: Left;  . SAVORY DILATION N/A 12/18/2016   Procedure: SAVORY DILATION;  Surgeon: Danie Binder, MD;  [facility];  Service: Endoscopy;  Laterality: N/A;   Social History   Socioeconomic History  . Marital  status: Married    Spouse name: larry   . Number of children: 2  . Years of education: 63  . Highest education level: 12th grade  Occupational History  . Occupation: retired   Scientific laboratory technician  . Financial resource strain: Somewhat hard  . Food insecurity    Worry: Never true    Inability: Never true  . Transportation needs    Medical: No    Non-medical: No  Tobacco Use  . Smoking status: Never Smoker  . Smokeless tobacco: Never Used  Substance and Sexual Activity  . Alcohol use: No  . Drug use: No  . Sexual activity: Not Currently  Lifestyle  . Physical activity    Days per week: 0 days    Minutes per session: 0 min  . Stress: Only a little  Relationships  . Social connections    Talks on phone: More than three times a week    Gets together: More than three times a week    Attends religious service: Never    Active member of club or organization: No    Attends meetings of clubs or organizations: Never    Relationship status: Married  . Intimate partner violence    Fear of current or ex partner: No    Emotionally abused: No    Physically abused: No    Forced sexual activity: No  Other Topics Concern  . Not on file  Social History Narrative   Lives alone with husband     Outpatient Encounter Medications as of 09/02/2018  Medication Sig  . acetaminophen (TYLENOL) 500 MG tablet Take 500 mg by mouth every 8 (eight) hours as needed for mild pain.   Marland Kitchen allopurinol (ZYLOPRIM) 100 MG tablet Take 1 tablet (100 mg total) by mouth every other day. Please take 100 mg by mouth on Sunday, Wednesday, and Fridays (Patient taking differently: Take 100 mg by mouth 2 (two) times daily. )  . amLODipine (NORVASC) 10 MG tablet TAKE 1 TABLET BY MOUTH ONCE DAILY.  . camphor-menthol (SARNA) lotion Apply 1 application topically every 8 (eight) hours as needed for itching.  . cloNIDine (CATAPRES) 0.3 MG tablet Take 1 tablet (0.3 mg total) by mouth 2 (two) times daily.  Marland Kitchen dexlansoprazole  (DEXILANT) 60 MG capsule TAKE 1 CAPSULE IN THE MORNING WITH FOOD (Patient taking differently: Take 60 mg by mouth daily with breakfast. )  . diclofenac sodium (VOLTAREN) 1 % GEL Apply 2 g topically 4 (four) times daily.  Marland Kitchen ezetimibe (ZETIA) 10 MG tablet TAKE 1 TABLET BY MOUTH ONCE DAILY. (Patient taking differently: Take 10 mg by mouth daily. )  . gabapentin (NEURONTIN) 300 MG capsule TAKE (1) CAPSULE BY MOUTH THREE TIMES DAILY  . GNP ASPIRIN LOW DOSE 81 MG EC  tablet TAKE 1 TABLET BY MOUTH ONCE DAILY. (Patient taking differently: Take 81 mg by mouth daily. )  . Insulin Degludec (TRESIBA FLEXTOUCH) 200 UNIT/ML SOPN Inject 40 Units into the skin daily.  . Insulin Lispro (HUMALOG KWIKPEN) 200 UNIT/ML SOPN Inject 18-25 Units into the skin 3 (three) times daily before meals.  . isosorbide mononitrate (IMDUR) 30 MG 24 hr tablet TAKE 1 TABLET BY MOUTH ONCE DAILY. (Patient taking differently: Take 30 mg by mouth daily. )  . olmesartan (BENICAR) 40 MG tablet TAKE 1 TABLET BY MOUTH ONCE A DAY.  Marland Kitchen ondansetron (ZOFRAN) 4 MG tablet Take 1 tablet (4 mg total) by mouth every 8 (eight) hours as needed for nausea or vomiting.  . sertraline (ZOLOFT) 50 MG tablet TAKE 1 TABLET BY MOUTH ONCE DAILY.  . sevelamer carbonate (RENVELA) 800 MG tablet Take 800 mg by mouth 3 (three) times daily with meals.   . Skin Protectants, Misc. (WHITE PETROLATUM-ZINC) 58.3 % cream Apply topically 3 (three) times daily as needed.  . traMADol (ULTRAM) 50 MG tablet Take one tablet two times daily, as needed, for pain (Patient taking differently: Take 50 mg by mouth 2 (two) times daily as needed for moderate pain. Take one tablet two times daily, as needed, for pain)  . ULTICARE MINI PEN NEEDLES 31G X 6 MM MISC USE AS DIRECTED  . nystatin (MYCOSTATIN) 100000 UNIT/ML suspension Take 5 mLs (500,000 Units total) by mouth 4 (four) times daily.  . [DISCONTINUED] FLUoxetine (PROZAC) 10 MG capsule Take 10 mg by mouth daily.    . [DISCONTINUED]  glipiZIDE (GLUCOTROL) 10 MG tablet Take 10 mg by mouth 2 (two) times daily before a meal.     Facility-Administered Encounter Medications as of 09/02/2018  Medication  . methylPREDNISolone sodium succinate (SOLU-MEDROL) 125 mg/2 mL injection 60 mg   Allergies  Allergen Reactions  . Ace Inhibitors Anaphylaxis and Swelling  . Penicillins Itching, Swelling and Other (See Comments)    Did it involve swelling of the face/tongue/throat, SOB, or low BP? Unknown Did it involve sudden or severe rash/hives, skin peeling, or any reaction on the inside of your mouth or nose? Unknown Did you need to seek medical attention at a hospital or doctor's office? Unknown When did it last happen?years  If all above answers are "NO", may proceed with cephalosporin use.   . Statins Other (See Comments)    elevated LFT's  . Albuterol Swelling    Review of Systems  All other systems reviewed and are negative. See HPI      Objective:     BP 140/62   Pulse 83   Temp 98.3 F (36.8 C) (Oral)   Resp 12   SpO2 97%   Physical Exam Vitals signs and nursing note reviewed.  Constitutional:      Appearance: She is well-developed. She is obese.  HENT:     Head: Normocephalic and atraumatic.     Mouth/Throat:     Lips: Pink.     Mouth: Angioedema present.     Tongue: No lesions.     Pharynx: Uvula midline.     Comments: Yellow/white coating on tongue Eyes:     Conjunctiva/sclera: Conjunctivae normal.  Neck:     Musculoskeletal: Normal range of motion and neck supple.  Cardiovascular:     Rate and Rhythm: Normal rate and regular rhythm.     Heart sounds: Normal heart sounds.  Pulmonary:     Effort: Pulmonary effort is normal.  Breath sounds: Normal breath sounds.  Neurological:     Mental Status: She is alert and oriented to person, place, and time.  Psychiatric:        Mood and Affect: Mood normal.        Behavior: Behavior normal.        Assessment and Plan        1. Oral  thrush S&S are consistent with thrush.  Provided with nystatin. Advised to practice good oral hygiene.   Reviewed side effects, risks and benefits of medication.   Patient acknowledged agreement and understanding of the plan.    - nystatin (MYCOSTATIN) 100000 UNIT/ML suspension; Take 5 mLs (500,000 Units total) by mouth 4 (four) times daily.  Dispense: 60 mL; Refill: 0  2. Angioedema, initial encounter Unsure of cause of tongue swelling. There is no airway compromise. Will give injection her ein office of depo.  Adden: Call patient back at 2:00pm she was feeling much better. Reported tongue swelling had gone down.   - methylPREDNISolone acetate (DEPO-MEDROL) injection 60 mg   No follow-ups on file.   Perlie Mayo, DNP, AGNP-BC [address], [place] [address] Office Hours: Mon-Thurs 8 am-5 pm; Fri 8 am-12 pm Office Phone:  [phone]  Office Fax: [phone]

## 2018-09-03 ENCOUNTER — Other Ambulatory Visit: Payer: Self-pay

## 2018-09-03 ENCOUNTER — Emergency Department (HOSPITAL_COMMUNITY): Payer: 59

## 2018-09-03 ENCOUNTER — Emergency Department (HOSPITAL_COMMUNITY)
Admission: EM | Admit: 2018-09-03 | Discharge: 2018-09-03 | Disposition: A | Discharge status: Home / Self Care | Payer: 59 | Attending: Emergency Medicine | Admitting: Emergency Medicine

## 2018-09-03 ENCOUNTER — Encounter (HOSPITAL_COMMUNITY): Payer: Self-pay | Admitting: Emergency Medicine

## 2018-09-03 DIAGNOSIS — R600 Localized edema: Secondary | ICD-10-CM

## 2018-09-03 DIAGNOSIS — Z794 Long term (current) use of insulin: Secondary | ICD-10-CM | POA: Diagnosis not present

## 2018-09-03 DIAGNOSIS — Z880 Allergy status to penicillin: Secondary | ICD-10-CM | POA: Diagnosis not present

## 2018-09-03 DIAGNOSIS — B370 Candidal stomatitis: Secondary | ICD-10-CM | POA: Diagnosis not present

## 2018-09-03 DIAGNOSIS — N186 End stage renal disease: Secondary | ICD-10-CM | POA: Insufficient documentation

## 2018-09-03 DIAGNOSIS — Z79899 Other long term (current) drug therapy: Secondary | ICD-10-CM | POA: Diagnosis not present

## 2018-09-03 DIAGNOSIS — R220 Localized swelling, mass and lump, head: Secondary | ICD-10-CM | POA: Diagnosis present

## 2018-09-03 DIAGNOSIS — I132 Hypertensive heart and chronic kidney disease with heart failure and with stage 5 chronic kidney disease, or end stage renal disease: Secondary | ICD-10-CM | POA: Insufficient documentation

## 2018-09-03 DIAGNOSIS — E1122 Type 2 diabetes mellitus with diabetic chronic kidney disease: Secondary | ICD-10-CM | POA: Diagnosis not present

## 2018-09-03 DIAGNOSIS — I5032 Chronic diastolic (congestive) heart failure: Secondary | ICD-10-CM | POA: Diagnosis not present

## 2018-09-03 DIAGNOSIS — Z992 Dependence on renal dialysis: Secondary | ICD-10-CM | POA: Diagnosis not present

## 2018-09-03 LAB — CBC WITH DIFFERENTIAL/PLATELET
Abs Immature Granulocytes: 0.01 10*3/uL (ref 0.00–0.07)
Basophils Absolute: 0 10*3/uL (ref 0.0–0.1)
Basophils Relative: 1 %
Eosinophils Absolute: 0.1 10*3/uL (ref 0.0–0.5)
Eosinophils Relative: 2 %
HCT: 30.6 % — ABNORMAL LOW (ref 36.0–46.0)
Hemoglobin: 9 g/dL — ABNORMAL LOW (ref 12.0–15.0)
Immature Granulocytes: 0 %
Lymphocytes Relative: 36 %
Lymphs Abs: 1.8 10*3/uL (ref 0.7–4.0)
MCH: 23.8 pg — ABNORMAL LOW (ref 26.0–34.0)
MCHC: 29.4 g/dL — ABNORMAL LOW (ref 30.0–36.0)
MCV: 81 fL (ref 80.0–100.0)
Monocytes Absolute: 0.5 10*3/uL (ref 0.1–1.0)
Monocytes Relative: 10 %
Neutro Abs: 2.6 10*3/uL (ref 1.7–7.7)
Neutrophils Relative %: 51 %
Platelets: 351 10*3/uL (ref 150–400)
RBC: 3.78 MIL/uL — ABNORMAL LOW (ref 3.87–5.11)
RDW: 17.7 % — ABNORMAL HIGH (ref 11.5–15.5)
WBC: 4.9 10*3/uL (ref 4.0–10.5)
nRBC: 0 % (ref 0.0–0.2)

## 2018-09-03 LAB — COMPREHENSIVE METABOLIC PANEL
ALT: 24 U/L (ref 0–44)
AST: 17 U/L (ref 15–41)
Albumin: 3.4 g/dL — ABNORMAL LOW (ref 3.5–5.0)
Alkaline Phosphatase: 129 U/L — ABNORMAL HIGH (ref 38–126)
Anion gap: 13 (ref 5–15)
BUN: 50 mg/dL — ABNORMAL HIGH (ref 8–23)
CO2: 26 mmol/L (ref 22–32)
Calcium: 8.9 mg/dL (ref 8.9–10.3)
Chloride: 100 mmol/L (ref 98–111)
Creatinine, Ser: 6 mg/dL — ABNORMAL HIGH (ref 0.44–1.00)
GFR calc Af Amer: 8 mL/min — ABNORMAL LOW (ref >60)
GFR calc non Af Amer: 7 mL/min — ABNORMAL LOW (ref >60)
Glucose, Bld: 242 mg/dL — ABNORMAL HIGH (ref 70–99)
Potassium: 4.8 mmol/L (ref 3.5–5.1)
Sodium: 139 mmol/L (ref 135–145)
Total Bilirubin: 0.4 mg/dL (ref 0.3–1.2)
Total Protein: 7.3 g/dL (ref 6.5–8.1)

## 2018-09-03 LAB — MAGNESIUM: Magnesium: 1.8 mg/dL (ref 1.7–2.4)

## 2018-09-03 MED ORDER — FLUCONAZOLE 100 MG PO TABS: 100.0000 mg | ORAL_TABLET | Freq: Every day | ORAL | 0 refills | Status: AC

## 2018-09-03 NOTE — ED Notes (Signed)
Report received   No patient in room when RN went to discharge

## 2018-09-03 NOTE — Discharge Instructions (Signed)
Your testing is reassuring -  No other acute findings Take Diflucan once daily for 7 days -I confirmed with the pharmacist that this is the correct dosing.  See your doctor as needed but return to the emergency department if you develop increasing or worsening swelling, difficulty breathing, or any other severe or worsening symptoms.

## 2018-09-03 NOTE — ED Provider Notes (Signed)
[facility] EMERGENCY DEPARTMENT Provider Note   CSN: [id number] Arrival date & time: 09/03/18  0753    History   Chief Complaint Chief Complaint  Patient presents with   Oral Swelling   Shortness of Breath    HPI Kathryn Beck is a 63 y.o. female.     HPI  The patient is a 63 year old female, she has a known history of acid reflux, she has a history of congestive heart failure, she also has end-stage renal disease and is currently on dialysis.  Last night was the first time that she tried peritoneal dialysis and states that it went well.  She is a diabetic and has hypertension.  She was seen yesterday at her family doctor's office by the nurse practitioner and was told that she likely had thrush and angioedema.  Unfortunately there is not much in the way of documentation other than it shows that the patient was given Solu-Medrol injection for possible angioedema and was given a prescription for nystatin oral suspension which the patient states she has taken the first dose.  She presents today because of ongoing tongue swelling.  In fact she states she feels swollen all over.  She denies fevers or chills or coughing but states that she does feel like she has some trouble breathing because she sleeps with her mouth open due to the way that her tongue feels.  She states there is a brown and yellow buildup and that the tongue feels abnormal, feels hard, it does not seem to be improving after the medications given yesterday.  She is not itchy.  She also endorses associated swelling of her bilateral eyelids and feels swollen all over her body.  Some of this is chronic edema.  She does have a known history of anaphylaxis and angioedema to an ACE inhibitor and is not taking that at this time.  Past Medical History:  Diagnosis Date   Acid reflux    Anemia    Anxiety    clausterophoic   Arthritis    Axillary masses    Soft tissue - status post excision   Back pain    CHF  (congestive heart failure) (HCC)    Depression    Dialysis patient ([place])    Diastolic heart failure (HCC)    End-stage renal disease ([place])    TTHSat  in [place] hypertension    History of blood transfusion    Ischemic heart disease    Abnormal Myoview April 2018 - medical therapy   Mixed hyperlipidemia    Obesity    Pancreatitis    Pneumonia 2019   two times in 2019   Sleep apnea    Noncompliant with CPAP   Type 2 diabetes mellitus, uncontrolled ([place])    Type II    Patient Active Problem List   Diagnosis Date Noted   Renal disease 08/22/2018   Not currently working due to disabled status 06/13/2018   ESRD on dialysis ([place]) 06/12/2018   Hypertension associated with diabetes (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[place]) 08/18/2017   Type 2 diabetes mellitus with hyperglycemia ([place]) 08/18/2017   Unsteady gait 07/11/2017   Recurrent falls 07/11/2017   Anemia  of chronic disease 03/24/2017   History of colonic polyps 02/10/2017   Dysphagia 11/05/2016   IBS (irritable bowel syndrome) 02/04/2016   Heat sensitivity 10/07/2014   Cardiac murmur 01/17/2014   Back pain with left-sided radiculopathy 09/18/2013   Seasonal allergies 03/10/2013   Lipoma of back 12/22/2012   GERD (gastroesophageal reflux disease) 08/22/2012   Sleep apnea 06/02/2011   CKD (chronic kidney disease) stage 4, GFR 15-29 ml/min (HCC) 01/13/2011   Neuropathy 04/16/2010   FATIGUE 07/24/2009   LIVER FUNCTION TESTS, ABNORMAL, HX OF 05/08/2009   LUPUS ERYTHEMATOSUS, DISCOID 06/05/2008   Arm pain 05/30/2008   Type 2 diabetes  mellitus with ESRD (end-stage renal disease) ([place]) 06/07/2007   Mixed hyperlipidemia 06/07/2007   Malignant hypertension 06/07/2007    Past Surgical History:  Procedure Laterality Date   ABDOMINAL HYSTERECTOMY     AV FISTULA PLACEMENT Left 09/02/2017   Procedure: creation of left arm ARTERIOVENOUS (AV) FISTULA;  Surgeon: Serafina Mitchell, MD;  Location: [facility];  Service: Vascular;  Laterality: Left;   COLONOSCOPY  2008   Dr. Oneida Alar: normal    COLONOSCOPY N/A 12/18/2016   Dr. Oneida Alar: multiple tubular adenomas, internal hemorrhoids. Surveillance in 3 years    ESOPHAGEAL DILATION N/A 10/13/2015   Procedure: ESOPHAGEAL DILATION;  Surgeon: Rogene Houston, MD;  [facility];  Service: Endoscopy;  Laterality: N/A;   ESOPHAGOGASTRODUODENOSCOPY N/A 10/13/2015   Dr. Laural Golden: chronic gastritis on path, no H.pylori. Empiric dilation    ESOPHAGOGASTRODUODENOSCOPY N/A 12/18/2016   Dr. Oneida Alar: mild gastritis. BRAVO study revealed uncontrolled GERD. Dysphagia secondary to uncontrolled reflux   FOOT SURGERY Bilateral    "nerve"     LUNG BIOPSY     MASS EXCISION Right 01/09/2013   Procedure: EXCISION OF NEOPLASM OF RIGHT  AXILLA  AND EXCISION OF NEOPLASM OF LEFT AXILLA;  Surgeon: Jamesetta So, MD;  Location: [facility];  Service: General;  Laterality: Right;  procedure end @ 08:23   MYRINGOTOMY WITH TUBE PLACEMENT Bilateral 04/28/2017   Procedure: BILATERAL MYRINGOTOMY WITH TUBE PLACEMENT;  Surgeon: Leta Baptist, MD;  Location: [facility];  Service: ENT;  Laterality: Bilateral;   REVISION OF ARTERIOVENOUS GORETEX GRAFT Left 05/04/2018   Procedure: TRANSPOSITION OF CEPHALIC VEIN ARTERIOVENOUS FISTULA LEFT ARM;  Surgeon: Rosetta Posner, MD;  Location: [place];  Service: Vascular;  Laterality: Left;   SAVORY DILATION N/A 12/18/2016   Procedure: SAVORY DILATION;  Surgeon: Danie Binder, MD;  [facility];  Service: Endoscopy;  Laterality: N/A;     OB History   No obstetric history on  file.      Home Medications    Prior to Admission medications   Medication Sig Start Date End Date Taking? Authorizing Provider  acetaminophen (TYLENOL) 500 MG tablet Take 500 mg by mouth every 8 (eight) hours as needed for mild pain.    Yes [provider]  allopurinol (ZYLOPRIM) 100 MG tablet Take 1 tablet (100 mg total) by mouth every other day. Please take 100 mg by mouth on Sunday, Wednesday, and Fridays Patient taking differently: Take 100 mg by mouth 2 (two) times daily.  06/02/18  Yes Perlie Mayo, NP  amLODipine (NORVASC) 10 MG tablet TAKE 1 TABLET BY MOUTH ONCE DAILY. 08/26/18  Yes Fayrene Helper, MD  cloNIDine (CATAPRES) 0.3 MG tablet Take 1 tablet (0.3 mg total) by mouth 2 (two) times daily. 05/11/18  Yes Fayrene Helper, MD  dexlansoprazole (DEXILANT) 60 MG capsule TAKE 1 CAPSULE IN  THE MORNING WITH FOOD Patient taking differently: Take 60 mg by mouth daily with breakfast.  04/07/18  Yes Fayrene Helper, MD  ezetimibe (ZETIA) 10 MG tablet TAKE 1 TABLET BY MOUTH ONCE DAILY. Patient taking differently: Take 10 mg by mouth daily.  05/09/18  Yes Fayrene Helper, MD  gabapentin (NEURONTIN) 300 MG capsule TAKE (1) CAPSULE BY MOUTH THREE TIMES DAILY 08/01/18  Yes Fayrene Helper, MD  GNP ASPIRIN LOW DOSE 81 MG EC tablet TAKE 1 TABLET BY MOUTH ONCE DAILY. Patient taking differently: Take 81 mg by mouth daily.  06/09/18  Yes Satira Sark, MD  Insulin Degludec (TRESIBA FLEXTOUCH) 200 UNIT/ML SOPN Inject 40 Units into the skin daily.   Yes [provider]  Insulin Lispro (HUMALOG KWIKPEN) 200 UNIT/ML SOPN Inject 18-25 Units into the skin 3 (three) times daily before meals. 08/31/18  Yes [name], MD  isosorbide mononitrate (IMDUR) 30 MG 24 hr tablet TAKE 1 TABLET BY MOUTH ONCE DAILY. Patient taking differently: Take 30 mg by mouth daily.  05/09/18  Yes Fayrene Helper, MD  nystatin (MYCOSTATIN) 100000 UNIT/ML suspension Take 5 mLs (500,000  Units total) by mouth 4 (four) times daily. 09/02/18  Yes Perlie Mayo, NP  ondansetron (ZOFRAN) 4 MG tablet Take 1 tablet (4 mg total) by mouth every 8 (eight) hours as needed for nausea or vomiting. 05/27/18  Yes Fayrene Helper, MD  traMADol (ULTRAM) 50 MG tablet Take one tablet two times daily, as needed, for pain Patient taking differently: Take 50 mg by mouth 2 (two) times daily as needed for moderate pain. Take one tablet two times daily, as needed, for pain 05/13/18  Yes Fayrene Helper, MD  ULTICARE MINI PEN NEEDLES 31G X 6 MM MISC USE AS DIRECTED 04/07/18  Yes Fayrene Helper, MD  fluconazole (DIFLUCAN) 100 MG tablet Take 1 tablet (100 mg total) by mouth daily for 7 days. 09/03/18 09/10/18  Noemi Chapel, MD  FLUoxetine (PROZAC) 10 MG capsule Take 10 mg by mouth daily.    05/28/11  [provider]  glipiZIDE (GLUCOTROL) 10 MG tablet Take 10 mg by mouth 2 (two) times daily before a meal.    05/28/11  [provider]  olmesartan (BENICAR) 40 MG tablet TAKE 1 TABLET BY MOUTH ONCE A DAY. Patient not taking: Reported on 09/03/2018 04/13/18 09/03/18  Fayrene Helper, MD  sertraline (ZOLOFT) 50 MG tablet TAKE 1 TABLET BY MOUTH ONCE DAILY. Patient not taking: Reported on 09/03/2018 08/01/18 09/03/18  Fayrene Helper, MD    Family History Family History  Problem Relation Age of Onset   Hypertension Father    Hypercholesterolemia Father    Arthritis Father    Hypertension Sister    Hypercholesterolemia Sister    Breast cancer Sister    Hypertension Sister    Colon cancer Neg Hx    Colon polyps Neg Hx     Social History Social History   Tobacco Use   Smoking status: Never Smoker   Smokeless tobacco: Never Used  Substance Use Topics   Alcohol use: No   Drug use: No     Allergies   Ace inhibitors, Penicillins, Statins, and Albuterol   Review of Systems Review of Systems  All other systems reviewed and are negative.    Physical  Exam Updated Vital Signs BP (!) 148/84    Pulse 70    Temp 98.9 F (37.2 C) (Oral)    Resp 14  Ht 1.626 m (5\' 4" )    Wt 84.8 kg    SpO2 96%    BMI 32.10 kg/m   Physical Exam Vitals signs and nursing note reviewed.  Constitutional:      General: She is not in acute distress.    Appearance: She is well-developed.  HENT:     Head: Normocephalic and atraumatic.     Comments: The tongue appears mildly enlarged, there does appear to be a dry surface of the tongue and a discolored buildup on the surface as well.  Her phonation is normal    Mouth/Throat:     Pharynx: No oropharyngeal exudate.  Eyes:     General: No scleral icterus.       Right eye: No discharge.        Left eye: No discharge.     Conjunctiva/sclera: Conjunctivae normal.     Pupils: Pupils are equal, round, and reactive to light.     Comments: Periorbital edema present  Neck:     Musculoskeletal: Normal range of motion and neck supple.     Thyroid: No thyromegaly.     Vascular: No JVD.  Cardiovascular:     Rate and Rhythm: Normal rate and regular rhythm.     Heart sounds: Normal heart sounds. No murmur. No friction rub. No gallop.   Pulmonary:     Effort: Pulmonary effort is normal. No respiratory distress.     Breath sounds: Normal breath sounds. No wheezing or rales.  Abdominal:     General: Bowel sounds are normal. There is no distension.     Palpations: Abdomen is soft. There is no mass.     Tenderness: There is no abdominal tenderness.  Musculoskeletal: Normal range of motion.        General: No tenderness.     Right lower leg: Edema present.     Left lower leg: Edema present.     Comments: Minimal edema of the lower extremities, symmetrical  Lymphadenopathy:     Cervical: No cervical adenopathy.  Skin:    General: Skin is warm and dry.     Findings: No erythema or rash.  Neurological:     Mental Status: She is alert.     Coordination: Coordination normal.  Psychiatric:        Behavior: Behavior  normal.      ED Treatments / Results  Labs (all labs ordered are listed, but only abnormal results are displayed) Labs Reviewed  CBC WITH DIFFERENTIAL/PLATELET - Abnormal; Notable for the following components:      Result Value   RBC 3.78 (*)    Hemoglobin 9.0 (*)    HCT 30.6 (*)    MCH 23.8 (*)    MCHC 29.4 (*)    RDW 17.7 (*)    All other components within normal limits  COMPREHENSIVE METABOLIC PANEL - Abnormal; Notable for the following components:   Glucose, Bld 242 (*)    BUN 50 (*)    Creatinine, Ser 6.00 (*)    Albumin 3.4 (*)    Alkaline Phosphatase 129 (*)    GFR calc non Af Amer 7 (*)    GFR calc Af Amer 8 (*)    All other components within normal limits  MAGNESIUM    EKG None  Radiology Ct Soft Tissue Neck Wo Contrast  Result Date: 09/03/2018 CLINICAL DATA:  Tongue and mouth swelling for 3 days. Shortness of breath. End-stage renal disease on dialysis. EXAM: CT NECK WITHOUT CONTRAST TECHNIQUE: Multidetector  CT imaging of the neck was performed following the standard protocol without intravenous contrast. COMPARISON:  None. FINDINGS: Pharynx and larynx: Absence of IV contrast and mild motion artifact through the pharynx and larynx limits assessment. Pharyngeal soft tissues are symmetric without evidence of significant swelling or mass. The airway is patent. There is no retropharyngeal fluid. There is mild haziness in the subcutaneous fat about the upper and lower lips and anterior chin area without fluid collection. Salivary glands: No inflammation, mass, or stone. Thyroid: Slightly prominent size of the thyroid gland without focal abnormality identified. Lymph nodes: No enlarged or suspicious lymph nodes identified in the neck. Vascular: Mild calcified atherosclerosis at the carotid bifurcations. Partially retropharyngeal course of both carotid arteries. Limited intracranial: Unremarkable. Visualized orbits: Not imaged. Mastoids and visualized paranasal sinuses: Minimal  mucosal thickening in the maxillary sinuses. Clear mastoid air cells. Skeleton: Mild cervical spondylosis. Upper chest: Reported separately. Other: None. IMPRESSION: Possible mild facial swelling. No fluid collection or other acute abnormality identified in the neck. Electronically Signed   By: Logan Bores M.D.   On: 09/03/2018 09:25   Ct Chest Wo Contrast  Result Date: 09/03/2018 CLINICAL DATA:  Shortness of breath. Facial swelling. End-stage renal disease on dialysis. EXAM: CT CHEST WITHOUT CONTRAST TECHNIQUE: Multidetector CT imaging of the chest was performed following the standard protocol without IV contrast. COMPARISON:  CT chest, abdomen, and pelvis 08/21/2017 FINDINGS: Cardiovascular: Aortic atherosclerosis without aneurysm. Three-vessel coronary artery atherosclerosis. Mild cardiomegaly. No pericardial effusion. Mediastinum/Nodes: No enlarged axillary, mediastinal, or hilar lymph nodes identified within limitations of noncontrast technique. Unremarkable esophagus. Lungs/Pleura: No pleural effusion or pneumothorax. Unchanged 6 mm right lower lobe nodule (series 6, image 71), considered benign given stability from 09/2015 abdominal CT. Unchanged 3 mm right lower lobe nodule (series 6, image 64). Similar appearance of pleuroparenchymal scarring with scattered calcification in the left lower lobe and lingula. Upper Abdomen: Small volume free fluid in the upper abdomen likely related to peritoneal dialysis. Unchanged chronic bilateral low-density adrenal gland enlargement. Musculoskeletal: Thoracic spondylosis. No suspicious osseous lesion. IMPRESSION: 1. No acute abnormality identified in the chest. 2. Chronic findings including left lung scarring. 3. Aortic Atherosclerosis (ICD10-I70.0). Electronically Signed   By: Logan Bores M.D.   On: 09/03/2018 09:33    Procedures Procedures (including critical care time)  Medications Ordered in ED Medications - No data to display   Initial Impression /  Assessment and Plan / ED Course  I have reviewed the triage vital signs and the nursing notes.  Pertinent labs & imaging results that were available during my care of the patient were reviewed by me and considered in my medical decision making (see chart for details).  Clinical Course as of Sep 02 945  Sat Sep 03, 2018  0943 The CT scans of the chest and the neck do not reveal any signs of obstructive mediastinal masses or superior vena cava syndrome.  The patient does have some edema of the face which is appreciable clinically but does not appear to relate to any underlying significant pathology.  She is not on an ACE inhibitor, she has no other reason to have angioedema and her tongue exam is more consistent with thrush which she is already being treated for.  We will add Diflucan, the patient will be given reassurance, she is otherwise stable for discharge   [BM]    Clinical Course User Index [BM] Noemi Chapel, MD       The patient is having edema, whether her tongue  is related to edema or related to a SVC syndrome or related to only thrush it is difficult to tell.  She was given steroids yesterday.  At this time she will need a CT scan of the chest and neck to rule out other sources of a possible SVC syndrome however I anticipate this more related to thrush.  Underlying metabolic issues may be present as well causing the edema especially as she is recently transitioned onto peritoneal dialysis.  She does not appear toxic, she is maintaining her airway, she is not febrile tachycardic or hypotensive.  Final Clinical Impressions(s) / ED Diagnoses   Final diagnoses:  Thrush  Facial edema    ED Discharge Orders         Ordered    fluconazole (DIFLUCAN) 100 MG tablet  Daily     09/03/18 0944           Noemi Chapel, MD 09/03/18 [phone]

## 2018-09-03 NOTE — ED Triage Notes (Signed)
Patient states tongue and mouth swelling x 3 days. States shortness of breath that started yesterday. Patient states started peritoneal dialysis last night.

## 2018-09-07 LAB — CORTISOL-AM, BLOOD: Cortisol - AM: 6.1 ug/dL

## 2018-09-07 LAB — DEXAMETHASONE, BLOOD: Dexamethasone, Serum: 446 ng/dL

## 2018-09-12 ENCOUNTER — Emergency Department (HOSPITAL_COMMUNITY)
Admission: EM | Admit: 2018-09-12 | Discharge: 2018-09-12 | Disposition: A | Discharge status: Home / Self Care | Payer: 59 | Attending: Emergency Medicine | Admitting: Emergency Medicine

## 2018-09-12 ENCOUNTER — Emergency Department (HOSPITAL_COMMUNITY): Payer: 59

## 2018-09-12 ENCOUNTER — Encounter (HOSPITAL_COMMUNITY): Payer: Self-pay | Admitting: Emergency Medicine

## 2018-09-12 ENCOUNTER — Other Ambulatory Visit: Payer: Self-pay

## 2018-09-12 DIAGNOSIS — N186 End stage renal disease: Secondary | ICD-10-CM | POA: Insufficient documentation

## 2018-09-12 DIAGNOSIS — R0602 Shortness of breath: Secondary | ICD-10-CM

## 2018-09-12 DIAGNOSIS — I132 Hypertensive heart and chronic kidney disease with heart failure and with stage 5 chronic kidney disease, or end stage renal disease: Secondary | ICD-10-CM | POA: Diagnosis not present

## 2018-09-12 DIAGNOSIS — Z992 Dependence on renal dialysis: Secondary | ICD-10-CM | POA: Diagnosis not present

## 2018-09-12 DIAGNOSIS — E119 Type 2 diabetes mellitus without complications: Secondary | ICD-10-CM | POA: Insufficient documentation

## 2018-09-12 DIAGNOSIS — I5030 Unspecified diastolic (congestive) heart failure: Secondary | ICD-10-CM | POA: Insufficient documentation

## 2018-09-12 DIAGNOSIS — I110 Hypertensive heart disease with heart failure: Secondary | ICD-10-CM | POA: Diagnosis not present

## 2018-09-12 DIAGNOSIS — J810 Acute pulmonary edema: Secondary | ICD-10-CM | POA: Diagnosis not present

## 2018-09-12 DIAGNOSIS — E875 Hyperkalemia: Secondary | ICD-10-CM

## 2018-09-12 HISTORY — DX: Dependence on renal dialysis: Z99.2

## 2018-09-12 LAB — CBC
HCT: 28.9 % — ABNORMAL LOW (ref 36.0–46.0)
Hemoglobin: 8.5 g/dL — ABNORMAL LOW (ref 12.0–15.0)
MCH: 23.6 pg — ABNORMAL LOW (ref 26.0–34.0)
MCHC: 29.4 g/dL — ABNORMAL LOW (ref 30.0–36.0)
MCV: 80.3 fL (ref 80.0–100.0)
Platelets: 315 10*3/uL (ref 150–400)
RBC: 3.6 MIL/uL — ABNORMAL LOW (ref 3.87–5.11)
RDW: 16.3 % — ABNORMAL HIGH (ref 11.5–15.5)
WBC: 7.4 10*3/uL (ref 4.0–10.5)
nRBC: 0 % (ref 0.0–0.2)

## 2018-09-12 LAB — COMPREHENSIVE METABOLIC PANEL
ALT: 33 U/L (ref 0–44)
AST: 25 U/L (ref 15–41)
Albumin: 3.1 g/dL — ABNORMAL LOW (ref 3.5–5.0)
Alkaline Phosphatase: 139 U/L — ABNORMAL HIGH (ref 38–126)
Anion gap: 10 (ref 5–15)
BUN: 33 mg/dL — ABNORMAL HIGH (ref 8–23)
CO2: 28 mmol/L (ref 22–32)
Calcium: 8.8 mg/dL — ABNORMAL LOW (ref 8.9–10.3)
Chloride: 104 mmol/L (ref 98–111)
Creatinine, Ser: 5.09 mg/dL — ABNORMAL HIGH (ref 0.44–1.00)
GFR calc Af Amer: 10 mL/min — ABNORMAL LOW (ref >60)
GFR calc non Af Amer: 8 mL/min — ABNORMAL LOW (ref >60)
Glucose, Bld: 144 mg/dL — ABNORMAL HIGH (ref 70–99)
Potassium: 5.2 mmol/L — ABNORMAL HIGH (ref 3.5–5.1)
Sodium: 142 mmol/L (ref 135–145)
Total Bilirubin: 0.2 mg/dL — ABNORMAL LOW (ref 0.3–1.2)
Total Protein: 7.4 g/dL (ref 6.5–8.1)

## 2018-09-12 LAB — TROPONIN I: Troponin I: <0.03 ng/mL (ref <0.03)

## 2018-09-12 LAB — BRAIN NATRIURETIC PEPTIDE: B Natriuretic Peptide: 283 pg/mL — ABNORMAL HIGH (ref 0.0–100.0)

## 2018-09-12 NOTE — ED Triage Notes (Signed)
Pt reports she woke up this morning with some shortness of breath and wheezing. States her mouth was full of blood. Was recently switched to peritoneal dialysis from hemo and has been doing this nightly. Has increased swelling to face and legs.

## 2018-09-12 NOTE — Discharge Instructions (Addendum)
You were evaluated in the Emergency Department and after careful evaluation, we did not find any emergent condition requiring admission or further testing in the hospital.  Your symptoms today seem to be due to mild fluid overload as your body adjusts to the new form of dialysis.  Your chest x-ray showed a small amount of pulmonary edema.  Your BNP was mildly elevated in the 200s.  Your potassium was 5.2.  We are confident that your kidney specialists can adjust your dialysis to improve these results.  Please keep your appointment for this afternoon and inform them of this information.  You should also ask about fluconazole and its dosing to help treat your continued oral thrush symptoms.  Please return to the Emergency Department if you experience any worsening of your condition.  We encourage you to follow up with a primary care provider.  Thank you for allowing [place] to be a part of your care.

## 2018-09-12 NOTE — ED Provider Notes (Signed)
[facility] Emergency Department Provider Note MRN:  [id number]  Arrival date & time: 09/12/18     Chief Complaint   Shortness of Breath   History of Present Illness   Kathryn Beck is a 63 y.o. year-old female with a history of ESRD presenting to the ED with chief complaint of shortness of breath.  2 to 3 days of persistent shortness of breath, worse with exertion.  Patient explains that she was recently transition from hemodialysis to peritoneal dialysis at home a week ago.  She has an appointment later today with her nephrologist to check on her status.  She is also here today because she woke up with blood in her mouth.  She explains that she was diagnosed with oral thrush and has been taking the nystatin swish and spit medication, but ran out a few days ago.  She explains that her tongue swells and hurts and this is worse when she wakes up in the morning.  Denies nosebleeds, no bleeding gums, no black stools, no blood in the stool.  Denies chest pain, no abdominal pain.  Review of Systems  A complete 10 system review of systems was obtained and all systems are negative except as noted in the HPI and PMH.   Patient's Health History    Past Medical History:  Diagnosis Date  . Acid reflux   . Anemia   . Anxiety    clausterophoic  . Arthritis   . Axillary masses    Soft tissue - status post excision  . Back pain   . CHF (congestive heart failure) ([place])   . Depression   . Dialysis patient ([place])   . Diastolic heart failure ([place])   . End-stage renal disease ([place])    TTHSat  in [place]  . Essential hypertension   . History of blood transfusion   . Ischemic heart disease    Abnormal Myoview April 2018 - medical therapy  . Mixed hyperlipidemia   . Obesity   . Pancreatitis   . Peritoneal dialysis catheter in place [facility])   . Pneumonia 2019   two times in 2019  . Sleep apnea    Noncompliant with CPAP  . Type 2 diabetes mellitus, uncontrolled ([place])    Type II    Past Surgical History:  Procedure Laterality Date  . ABDOMINAL HYSTERECTOMY    . AV FISTULA PLACEMENT Left 09/02/2017   Procedure: creation of left arm ARTERIOVENOUS (AV) FISTULA;  Surgeon: Serafina Mitchell, MD;  Location: [facility] OR;  Service: Vascular;  Laterality: Left;  . COLONOSCOPY  2008   Dr. Oneida Alar: normal   . COLONOSCOPY N/A 12/18/2016   Dr. Oneida Alar: multiple tubular adenomas, internal hemorrhoids. Surveillance in 3 years   . ESOPHAGEAL DILATION N/A 10/13/2015   Procedure: ESOPHAGEAL DILATION;  Surgeon: Rogene Houston, MD;  [facility];  Service: Endoscopy;  Laterality: N/A;  . ESOPHAGOGASTRODUODENOSCOPY N/A 10/13/2015   Dr. Laural Golden: chronic gastritis on path, no H.pylori. Empiric dilation   . ESOPHAGOGASTRODUODENOSCOPY N/A 12/18/2016   Dr. Oneida Alar: mild gastritis. BRAVO study revealed uncontrolled GERD. Dysphagia secondary to uncontrolled reflux  . FOOT SURGERY Bilateral    "nerve"    . LUNG BIOPSY    . MASS EXCISION Right 01/09/2013   Procedure: EXCISION OF NEOPLASM OF RIGHT  AXILLA  AND EXCISION OF NEOPLASM OF LEFT AXILLA;  Surgeon: Jamesetta So, MD;  Location: [facility];  Service: General;  Laterality: Right;  procedure end @ 08:23  . MYRINGOTOMY  WITH TUBE PLACEMENT Bilateral 04/28/2017   Procedure: BILATERAL MYRINGOTOMY WITH TUBE PLACEMENT;  Surgeon: Leta Baptist, MD;  Location: [facility];  Service: ENT;  Laterality: Bilateral;  . REVISION OF ARTERIOVENOUS GORETEX GRAFT Left 05/04/2018   Procedure: TRANSPOSITION OF CEPHALIC VEIN ARTERIOVENOUS FISTULA LEFT ARM;  Surgeon: Rosetta Posner, MD;  Location: [facility];  Service: Vascular;  Laterality: Left;  . SAVORY DILATION N/A 12/18/2016   Procedure: SAVORY DILATION;  Surgeon: Danie Binder, MD;  [facility];  Service: Endoscopy;  Laterality: N/A;    Family History  Problem Relation Age of Onset  . Hypertension Father   . Hypercholesterolemia Father   . Arthritis Father   . Hypertension Sister   .  Hypercholesterolemia Sister   . Breast cancer Sister   . Hypertension Sister   . Colon cancer Neg Hx   . Colon polyps Neg Hx     Social History   Socioeconomic History  . Marital status: Married    Spouse name: larry   . Number of children: 2  . Years of education: 43  . Highest education level: 12th grade  Occupational History  . Occupation: retired   Scientific laboratory technician  . Financial resource strain: Somewhat hard  . Food insecurity    Worry: Never true    Inability: Never true  . Transportation needs    Medical: No    Non-medical: No  Tobacco Use  . Smoking status: Never Smoker  . Smokeless tobacco: Never Used  Substance and Sexual Activity  . Alcohol use: No  . Drug use: No  . Sexual activity: Not Currently  Lifestyle  . Physical activity    Days per week: 0 days    Minutes per session: 0 min  . Stress: Only a little  Relationships  . Social connections    Talks on phone: More than three times a week    Gets together: More than three times a week    Attends religious service: Never    Active member of club or organization: No    Attends meetings of clubs or organizations: Never    Relationship status: Married  . Intimate partner violence    Fear of current or ex partner: No    Emotionally abused: No    Physically abused: No    Forced sexual activity: No  Other Topics Concern  . Not on file  Social History Narrative   Lives alone with husband      Physical Exam  Vital Signs and Nursing Notes reviewed Vitals:   09/12/18 1030 09/12/18 1100  BP: (!) 167/74 (!) 153/76  Pulse:    Resp: 16 18  Temp:    SpO2:      CONSTITUTIONAL: Well-appearing, NAD NEURO:  Alert and oriented x 3, no focal deficits EYES:  eyes equal and reactive ENT/NECK:  no LAD, no JVD CARDIO: Regular rate, well-perfused, normal S1 and S2 PULM:  CTAB no wheezing or rhonchi GI/GU:  normal bowel sounds, non-distended, non-tender MSK/SPINE:  No gross deformities, scant edema bilateral lower  extremities SKIN:  no rash, atraumatic PSYCH:  Appropriate speech and behavior  Diagnostic and Interventional Summary    EKG Interpretation  Date/Time:  Monday September 12 2018 08:59:23 EDT Ventricular Rate:  74 PR Interval:    QRS Duration: 84 QT Interval:  428 QTC Calculation: 475 R Axis:   36 Text Interpretation:  Sinus rhythm Borderline low voltage, extremity leads Confirmed by Gerlene Fee [phone]) on 09/12/2018 10:29:10 AM  Labs Reviewed  CBC - Abnormal; Notable for the following components:      Result Value   RBC 3.60 (*)    Hemoglobin 8.5 (*)    HCT 28.9 (*)    MCH 23.6 (*)    MCHC 29.4 (*)    RDW 16.3 (*)    All other components within normal limits  COMPREHENSIVE METABOLIC PANEL - Abnormal; Notable for the following components:   Potassium 5.2 (*)    Glucose, Bld 144 (*)    BUN 33 (*)    Creatinine, Ser 5.09 (*)    Calcium 8.8 (*)    Albumin 3.1 (*)    Alkaline Phosphatase 139 (*)    Total Bilirubin 0.2 (*)    GFR calc non Af Amer 8 (*)    GFR calc Af Amer 10 (*)    All other components within normal limits  BRAIN NATRIURETIC PEPTIDE - Abnormal; Notable for the following components:   B Natriuretic Peptide 283.0 (*)    All other components within normal limits  TROPONIN I    DG Chest 2 View  Final Result      Medications - No data to display   Procedures Critical Care  ED Course and Medical Decision Making  I have reviewed the triage vital signs and the nursing notes.  Pertinent labs & imaging results that were available during my care of the patient were reviewed by me and considered in my medical decision making (see below for details).  Suspect mild fluid overloaded state in the setting of switching to peritoneal dialysis in this 63 year old female complaining of mild shortness of breath.  Will check chest x-ray and BNP and reevaluate.  Patient's oral bleeding likely related to inflammation from thrush, no other obvious source of bleeding on my  exam, patient ran out of her nystatin, will refill and advise close follow-up with PCP.  Anticipating discharge.  Labs reveal mild fluid overloaded state and a mild hyperkalemia.  Patient continues to look and feel well.  She has an appointment with her nephrologist today in 2 hours.  Reasonable to have her keep this appointment and allow the nephrologist to make adjustments to her peritoneal dialysis to likely correct these abnormalities.  Patient also advised to discuss fluconazole and its dosing with her nephrologist.  She seems to have failed nystatin swish and spit for management of oral thrush.  After the discussed management above, the patient was determined to be safe for discharge.  The patient was in agreement with this plan and all questions regarding their care were answered.  ED return precautions were discussed and the patient will return to the ED with any significant worsening of condition.  Barth Kirks. Sedonia Small, [place] [email or web address]  Final Clinical Impressions(s) / ED Diagnoses     ICD-10-CM   1. Acute pulmonary edema (HCC)  J81.0   2. SOB (shortness of breath)  R06.02 DG Chest 2 View    DG Chest 2 View  3. Hyperkalemia  E87.5     ED Discharge Orders    None         Maudie Flakes, MD 09/12/18 1132

## 2018-09-12 NOTE — ED Notes (Signed)
Patient returned from CT

## 2018-09-12 NOTE — ED Notes (Signed)
Pt returned from X-ray.  

## 2018-09-14 ENCOUNTER — Other Ambulatory Visit: Payer: Self-pay | Admitting: Internal Medicine

## 2018-09-14 DIAGNOSIS — D3500 Benign neoplasm of unspecified adrenal gland: Secondary | ICD-10-CM

## 2018-09-16 ENCOUNTER — Telehealth: Payer: Self-pay

## 2018-09-16 NOTE — Telephone Encounter (Signed)
-----   Message from [name], MD sent at 09/14/2018  2:16 PM EDT ----- Kathryn Beck, can you please call pt: Patient cortisol level was higher than expected if she actually took the dexamethasone the night before.  I would like her to perform a 24-hour urine collection for cortisol  (will order the test).  Can you please ask her to come and pick up the jug, but the collection over the weekend and then bring it back afterwards:  Patient information (Up-to-Date): Collection of a 24-hour urine specimen   - You should collect every drop of urine during each 24-hour period. It does not matter how much or little urine is passed each time, as long as every drop is collected. - Begin the urine collection in the morning after you wake up, after you have emptied your bladder for the first time. - Urinate (empty the bladder) for the first time and flush it down the toilet. Note the exact time (eg, 6:15 AM). You will begin the urine collection at this time. - Collect every drop of urine during the day and night in an empty collection bottle. Store the bottle at room temperature or in the refrigerator. - If you need to have a bowel movement, any urine passed with the bowel movement should be collected. Try not to include feces with the urine collection. If feces does get mixed in, do not try to remove the feces from the urine collection bottle. - Finish by collecting the first urine passed the next morning, adding it to the collection bottle. This should be within ten minutes before or after the time of the first morning void on the first day (which was flushed). In this example, you would try to void between 6:05 and 6:25 on the second day. - If you need to urinate one hour before the final collection time, drink a full glass of water so that you can void again at the appropriate time. If you have to urinate 20 minutes before, try to hold the urine until the proper time. - Please note the exact time of the final  collection, even if it is not the same time as when collection began on day 1. - The bottle(s) may be kept at room temperature for a day or two, but should be kept cool or refrigerated for longer periods of time.

## 2018-09-16 NOTE — Telephone Encounter (Signed)
Left message for patient to return our call at [phone].  

## 2018-09-20 ENCOUNTER — Other Ambulatory Visit: Payer: Self-pay

## 2018-09-20 ENCOUNTER — Ambulatory Visit (INDEPENDENT_AMBULATORY_CARE_PROVIDER_SITE_OTHER): Payer: 59 | Admitting: Family Medicine

## 2018-09-20 ENCOUNTER — Other Ambulatory Visit (HOSPITAL_COMMUNITY)
Admission: RE | Admit: 2018-09-20 | Discharge: 2018-09-20 | Disposition: A | Discharge status: Home / Self Care | Payer: 59 | Source: Ambulatory Visit | Attending: Family Medicine | Admitting: Family Medicine

## 2018-09-20 ENCOUNTER — Telehealth: Payer: Self-pay | Admitting: *Deleted

## 2018-09-20 ENCOUNTER — Encounter: Payer: Self-pay | Admitting: Family Medicine

## 2018-09-20 VITALS — BP 124/64 | HR 88 | Temp 97.9°F | Resp 15 | Ht 64.0 in | Wt 210.0 lb

## 2018-09-20 DIAGNOSIS — R52 Pain, unspecified: Secondary | ICD-10-CM | POA: Insufficient documentation

## 2018-09-20 DIAGNOSIS — R4189 Other symptoms and signs involving cognitive functions and awareness: Secondary | ICD-10-CM

## 2018-09-20 DIAGNOSIS — G4733 Obstructive sleep apnea (adult) (pediatric): Secondary | ICD-10-CM

## 2018-09-20 DIAGNOSIS — I10 Essential (primary) hypertension: Secondary | ICD-10-CM

## 2018-09-20 DIAGNOSIS — R2681 Unsteadiness on feet: Secondary | ICD-10-CM

## 2018-09-20 DIAGNOSIS — E1122 Type 2 diabetes mellitus with diabetic chronic kidney disease: Secondary | ICD-10-CM

## 2018-09-20 DIAGNOSIS — I259 Chronic ischemic heart disease, unspecified: Secondary | ICD-10-CM

## 2018-09-20 DIAGNOSIS — R29898 Other symptoms and signs involving the musculoskeletal system: Secondary | ICD-10-CM

## 2018-09-20 DIAGNOSIS — N186 End stage renal disease: Secondary | ICD-10-CM

## 2018-09-20 MED ORDER — HYDROCODONE-ACETAMINOPHEN 5-325 MG PO TABS: ORAL_TABLET | ORAL | 0 refills | Status: AC

## 2018-09-20 NOTE — Telephone Encounter (Signed)
Pt wanted to let Dr. Moshe Cipro know that Dr. Aline Brochure had ordered physical therapy on her hand to start in a couple of weeks. Wanted to see if Dr. Moshe Cipro would order physical therapy for her legs while she was doing pt on her hand.

## 2018-09-20 NOTE — Progress Notes (Addendum)
Kathryn Beck     MRN: [id number]      DOB: 03/25/1955   HPI Kathryn Beck is here for follow up and re-evaluation following recent Ed visit  When she presented with dyspnea and thrush. Has upcoming appt at [facility] for  Renal transplant and needs Cardiology to evaluate her before this. C/o vivid dreams , very real, and also needs to get back on her CPAP machine for treatment of sleep apnea C/o uncontrolled neck and upper extremity pain , states tramadol of no benefit Denies polyuria, polydipsia, blurred vision , or hypoglycemic episodes. C/o lower extremity weakness and requests PT to eval and manage, and is currently getting therapy to her upper extremities    ROS Denies recent fever or chills. Denies sinus pressure, nasal congestion, ear pain or sore throat. Denies chest congestion, productive cough or wheezing. Denies chest pains, palpitations and leg swelling Denies abdominal pain, nausea, vomiting,diarrhea or constipation.   Denies dysuria, frequency, hesitancy or incontinence.  Denies headaches, seizures, numbness, or tingling. Denies uncontrolled  depression,  Denies skin break down or rash.   PE  BP 124/64   Pulse 88   Temp 97.9 F (36.6 C) (Temporal)   Resp 15   Ht 5\' 4"  (1.626 m)   Wt 210 lb (95.3 kg)   SpO2 99%   BMI 36.05 kg/m   Patient alert and oriented and in no cardiopulmonary distress.  HEENT: No facial asymmetry, EOMI,   oropharynx pink and moist.  Neck decreased ROM no JVD, no mass.  Chest: Clear to auscultation bilaterally.  CVS: S1, S2 no murmurs, no S3.Regular rate.  ABD: Soft non tender.   Ext: No edema  MS: Decreased  ROM spine and  Shoulders, normal in hips and knees.  Skin: Intact, no ulcerations or rash noted.  Psych: Good eye contact, normal affect. Memory intact not anxious or depressed appearing.  CNS: CN 2-12 intact, power,  normal throughout.no focal deficits noted.   Assessment & Plan  Sleep apnea Currently untreated  needs appt with sleep specialist to start treatment once more  Ischemic heart disease Needs evaluation by cardiology, recently pesented to Ed with dyspnea and has upcoming evaluation in [place] for renal transplant   Malignant hypertension Controlled, no change in medication DASH diet and commitment to daily physical activity for a minimum of 30 minutes discussed and encouraged, as a part of hypertension management. The importance of attaining a healthy weight is also discussed.  BP/Weight 09/20/2018 09/12/2018 09/03/2018 09/02/2018 08/31/2018 11/26/930 08/28/1243  Systolic BP 809 983 382 505 [phone]  Diastolic BP 64 72 89 62 80 66 62  Wt. (Lbs) 210 198.41 187 - 215 183.08 189  BMI 36.05 34.06 32.1 - 36.9 31.43 32.44       Type 2 diabetes mellitus with ESRD (end-stage renal disease) ([place]) . con1 DASH diet and commitment to daily physical activity for a minimum of 30 minutes discussed and encouraged, as a part of hypertension management. The importance of attaining a healthy weight is also discussed.  BP/Weight 09/20/2018 09/12/2018 09/03/2018 09/02/2018 08/31/2018 05/28/9022 0/11/7351  Systolic BP 299 242 683 419 [phone]  Diastolic BP 64 72 89 62 80 66 62  Wt. (Lbs) 210 198.41 187 - 215 183.08 189  BMI 36.05 34.06 32.1 - 36.9 31.43 32.44       Unsteady gait Unsteady gait secondary to neuropathy and lower extremity weakness, recommend physical therapy twice weekly x  Weeks for strengthening and fall risk reduction

## 2018-09-20 NOTE — Telephone Encounter (Signed)
Do you agree to pt request?

## 2018-09-20 NOTE — Patient Instructions (Addendum)
F/U with mD in 12 weeks, review pain management   I will contact Dr Merlene Laughter  re need for sleep study, vivid dreams that are so real that they are keeping you awake  I will refer you to cardiology asap , as you are going for an appointment for a kidney transplant in August and need this before you go  Congrtats on excellent bloodpressure and blood sugar  Stop tramadol for pain as ineffective  New for pain is hydrocodone 5/325 one daily  Pain contract discussed and signed with me today  Thanks for choosing [facility], we consider it a privelige to serve you.

## 2018-09-22 ENCOUNTER — Other Ambulatory Visit: Payer: Self-pay | Admitting: Family Medicine

## 2018-09-22 LAB — MISC LABCORP TEST (SEND OUT): Labcorp test code: 738526

## 2018-09-25 ENCOUNTER — Encounter: Payer: Self-pay | Admitting: Family Medicine

## 2018-09-25 DIAGNOSIS — I259 Chronic ischemic heart disease, unspecified: Secondary | ICD-10-CM | POA: Insufficient documentation

## 2018-09-25 MED ORDER — HYDROCODONE-ACETAMINOPHEN 5-325MG PREPACK (~~LOC~~: ORAL_TABLET | ORAL | 0 refills | Status: DC

## 2018-09-25 MED ORDER — HYDROCODONE-ACETAMINOPHEN 5-325 MG PO TABS: ORAL_TABLET | ORAL | 0 refills | Status: DC

## 2018-09-25 NOTE — Assessment & Plan Note (Signed)
Controlled, no change in medication DASH diet and commitment to daily physical activity for a minimum of 30 minutes discussed and encouraged, as a part of hypertension management. The importance of attaining a healthy weight is also discussed.  BP/Weight 09/20/2018 09/12/2018 09/03/2018 09/02/2018 08/31/2018 06/25/1710 09/27/7181  Systolic BP 672 550 016 429 [phone]  Diastolic BP 64 72 89 62 80 66 62  Wt. (Lbs) 210 198.41 187 - 215 183.08 189  BMI 36.05 34.06 32.1 - 36.9 31.43 32.44

## 2018-09-25 NOTE — Assessment & Plan Note (Signed)
.   con1 DASH diet and commitment to daily physical activity for a minimum of 30 minutes discussed and encouraged, as a part of hypertension management. The importance of attaining a healthy weight is also discussed.  BP/Weight 09/20/2018 09/12/2018 09/03/2018 09/02/2018 08/31/2018 10/27/5782 08/29/6293  Systolic BP 284 132 440 102 725 366 440  Diastolic BP 64 72 89 62 80 66 62  Wt. (Lbs) 210 198.41 187 - 215 183.08 189  BMI 36.05 34.06 32.1 - 36.9 31.43 32.44

## 2018-09-25 NOTE — Assessment & Plan Note (Signed)
Needs evaluation by cardiology, recently pesented to Ed with dyspnea and has upcoming evaluation in [place] for renal transplant

## 2018-09-25 NOTE — Assessment & Plan Note (Signed)
Currently untreated needs appt with sleep specialist to start treatment once more

## 2018-09-28 NOTE — Telephone Encounter (Signed)
Left message for patient to return our call at [phone].  

## 2018-09-29 ENCOUNTER — Other Ambulatory Visit: Payer: Self-pay | Admitting: Family Medicine

## 2018-09-30 ENCOUNTER — Other Ambulatory Visit: Payer: Self-pay

## 2018-10-04 ENCOUNTER — Encounter: Payer: Self-pay | Admitting: Internal Medicine

## 2018-10-04 ENCOUNTER — Other Ambulatory Visit: Payer: Self-pay

## 2018-10-04 ENCOUNTER — Ambulatory Visit (INDEPENDENT_AMBULATORY_CARE_PROVIDER_SITE_OTHER): Payer: 59 | Admitting: Internal Medicine

## 2018-10-04 VITALS — BP 130/70 | HR 70 | Ht 64.0 in | Wt 213.0 lb

## 2018-10-04 DIAGNOSIS — D3500 Benign neoplasm of unspecified adrenal gland: Secondary | ICD-10-CM

## 2018-10-04 DIAGNOSIS — N186 End stage renal disease: Secondary | ICD-10-CM

## 2018-10-04 DIAGNOSIS — E1122 Type 2 diabetes mellitus with diabetic chronic kidney disease: Secondary | ICD-10-CM

## 2018-10-04 LAB — POTASSIUM: Potassium: 4.9 mEq/L (ref 3.5–5.1)

## 2018-10-04 NOTE — Patient Instructions (Addendum)
Please move: - Tresiba 40 units daily after dialysis  Take Humalog EVERY TIME you eat, 15 min before the meal, if sugars are >60. Please change: - Humalog 18 units before a smaller meal - Humalog 22 units before a larger meal  Please add the following sliding scale: - 150-175: + 1 unit  - 176-200: + 2 units  - 201-225: + 3 units  - 226-250: + 4 units  - 251-275: + 5 units - 276-300: + 6 units

## 2018-10-04 NOTE — Progress Notes (Signed)
Patient ID: Kathryn Beck, female   DOB: November 10, 1955, 63 y.o.   MRN: [id number]  HPI: Kathryn Beck is a 63 y.o.-year-old female, initially referred by her PCP, Dr. Moshe Cipro, returning for follow-up for of DM2, dx in 2008, insulin-dependent since ~2015, uncontrolled, with complications (CKD stage 4, PN). She saw Dr. Dorris Fetch - last OV with him 05/2016.  Last visit with me 1.5 months ago.  Before last visit, she started hemodialysis.  She was originally planning to get a gastric bypass, but the plans are on hold for now.  Her sugars started to improve close and her long-acting insulin was decreased by 50%.  She was not taking her Humalog unless her sugars were high so at last visit we discussed about reducing the doses but take it consistently. At this visit, she tells me that she is taking Humalog only if sugars are higher than 160!  Adrenal masses.  05/28/2017: MRI abd: Bilateral adrenal nodules with signal dropout on out of phase imaging, consistent with adenomas.   Example at 2.5 cm on the left and on the order of 1.6 cm on the right.  10/10/2015: CT abdomen and pelvis without contrast: Stable 2.2 cm left adrenal nodule with Hounsfield unit measurements 28 unchanged likely a lipid poor adenoma  08/23/2008: MRI of the abdomen with and without contrast: Left adrenal mass measuring 1.6 x 1.5 cm (previously measuring 1.4 x 1.6 cm on the CT scan from 2003 -stable in size),  Of note, she has an enlarging pancreatic head mass - pt reports a h/o pancreatitis. (This will need to be eval. By GI)  Reviewed previous adrenal biochemical investigation:  Dexamethasone suppression test showed a nonsuppressed cortisol level, but I suspect that this is due to her end-stage renal disease and the deficiency cortisol clearance, rather than increased production: Component     Latest Ref Rng & Units 08/31/2018  Cortisol - AM     mcg/dL 6.1  Dexamethasone, Serum     ng/dL 446   I ordered a 24-hour urine cortisol   after her last visit, however, of course, this cannot be done as she is on dialysis.  Previous investigation was negative for hyperaldosteronism or pheochromocytoma: Component     Latest Ref Rng & Units 11/30/2017  Epinephrine     pg/mL  undetectable  Norepinephrine     pg/mL 99  Dopamine     pg/mL  undetectable  Catecholamines, Total     pg/mL 99  Metanephrine, Pl     <=57 pg/mL <25  Normetanephrine, Pl     <=148 pg/mL 71  Total Metanephrines-Plasma     <=205 pg/mL 71  ALDOSTERONE      ng/dL 3  Renin Activity     0.25 - 5.82 ng/mL/h 1.20  ALDO / PRA Ratio     0.9 - 28.9 Ratio 2.5  Potassium     3.5 - 5.1 mEq/L 4.0   DM2: Last hemoglobin A1c was: Lab Results  Component Value Date   HGBA1C 6.8 (A) 08/31/2018   HGBA1C 6.9 (H) 06/09/2018   HGBA1C 8.4 (H) 03/08/2018   Pt was on a regimen of: - Humalog 75/25 20-26 units before each meal, 2-3X a day depending on the CBG before the meal  - Levemir 100 units at bedtime  Then on: - Tresiba 40 units daily after dialysis - Humalog 20 units before a smaller meal - Humalog 32 units before a larger meal Please do not take Humalog if sugars before a meal are <  60. Otherwise, take it whenever you eat.   Pt checks her sugars 1-3 times a day per review of her log: - am: 232-493 >> 93-336 >> 64-322, 356 >> 68, 103-406, 530 - 2h after b'fast: n/c >> 202, 296, 462 >> n/c >> 31, 95-373 - before lunch: 31, 89-127 >> 55, 89-356, HI >> 68, 125-323 - 2h after lunch: n/c >> 178 - before dinner: 280-436 >> 217, 300 >> 64-288, 340 >> n/c - 2h after dinner: n/c - bedtime:422, 448 >> 167-242, 362 >> 90-207 >> n/c - nighttime: n/c Lowest sugar was 164 >> 31 (???) >> 35 before decreasing the insulin dose, then 55 >> 31; it is unclear at which level she has hypoglycemia awareness. Highest sugar was 487 >> 362 >> HI >> 530.  Glucometer: Accuchek  Pt's meals are: - Breakfast: eggs, bacon, oatmeal, grits - Lunch: salads - Dinner: meat +  veggie or sandwich + salad or nabs - Snacks: nabs, fruit cups, jello  -+ End-stage renal disease, now on dialysis, last BUN/creatinine:  Lab Results  Component Value Date   BUN 33 (H) 09/12/2018   BUN 50 (H) 09/03/2018   CREATININE 5.09 (H) 09/12/2018   CREATININE 6.00 (H) 09/03/2018  Off olmesartan. -+ HL; last set of lipids: Lab Results  Component Value Date   CHOL 199 03/08/2018   HDL 37 (L) 03/08/2018   LDLCALC 114 (H) 03/08/2018   LDLDIRECT 107 05/19/2016   TRIG 242 (H) 03/08/2018   CHOLHDL 5.4 03/08/2018  On Zetia 10.  Off Lipitor 40. - last eye exam was on 08/2017: Reportedly no DR -No numbness but she has tingling in her feet.  On Neurontin  She also has HTN.  ROS: Constitutional: + weight gain/no weight loss, no fatigue, no subjective hyperthermia, no subjective hypothermia Eyes: no blurry vision, no xerophthalmia ENT: no sore throat, no nodules palpated in neck, no dysphagia, no odynophagia, no hoarseness Cardiovascular: no CP/no SOB/no palpitations/+ leg swelling Respiratory: no cough/no SOB/no wheezing Gastrointestinal: no N/no V/no D/no C/no acid reflux Musculoskeletal: no muscle aches/+ joint aches Skin: no rashes, no hair loss Neurological: no tremors/no numbness/no tingling/no dizziness  I reviewed pt's medications, allergies, PMH, social hx, family hx, and changes were documented in the history of present illness. Otherwise, unchanged from my initial visit note.  Past Medical History:  Diagnosis Date  . Acid reflux   . Anemia   . Anxiety    clausterophoic  . Arthritis   . Axillary masses    Soft tissue - status post excision  . Back pain   . CHF (congestive heart failure) ([place])   . Depression   . Dialysis patient ([place])   . Diastolic heart failure ([place])   . End-stage renal disease ([place])    TTHSat  in [place]  . Essential hypertension   . History of blood transfusion   . Ischemic heart disease    Abnormal Myoview April 2018 - medical therapy   . Mixed hyperlipidemia   . Obesity   . Pancreatitis   . Peritoneal dialysis catheter in place [facility])   . Pneumonia 2019   two times in 2019  . Sleep apnea    Noncompliant with CPAP  . Type 2 diabetes mellitus, uncontrolled ([place])    Type II   Past Surgical History:  Procedure Laterality Date  . ABDOMINAL HYSTERECTOMY    . AV FISTULA PLACEMENT Left 09/02/2017   Procedure: creation of left arm ARTERIOVENOUS (AV) FISTULA;  Surgeon: Serafina Mitchell, MD;  Location: [facility] OR;  Service: Vascular;  Laterality: Left;  . COLONOSCOPY  2008   Dr. Oneida Alar: normal   . COLONOSCOPY N/A 12/18/2016   Dr. Oneida Alar: multiple tubular adenomas, internal hemorrhoids. Surveillance in 3 years   . ESOPHAGEAL DILATION N/A 10/13/2015   Procedure: ESOPHAGEAL DILATION;  Surgeon: Rogene Houston, MD;  Location: [facility] SU[facility];  Service: Endoscopy;  Laterality: N/A;  . ESOPHAGOGASTRODUODENOSCOPY N/A 10/13/2015   Dr. Laural Golden: chronic gastritis on path, no H.pylori. Empiric dilation   . ESOPHAGOGASTRODUODENOSCOPY N/A 12/18/2016   Dr. Oneida Alar: mild gastritis. BRAVO study revealed uncontrolled GERD. Dysphagia secondary to uncontrolled reflux  . FOOT SURGERY Bilateral    "nerve"    . LUNG BIOPSY    . MASS EXCISION Right 01/09/2013   Procedure: EXCISION OF NEOPLASM OF RIGHT  AXILLA  AND EXCISION OF NEOPLASM OF LEFT AXILLA;  Surgeon: Jamesetta So, MD;  Location: [facility] ORS;  Service: General;  Laterality: Right;  procedure end @ 08:23  . MYRINGOTOMY WITH TUBE PLACEMENT Bilateral 04/28/2017   Procedure: BILATERAL MYRINGOTOMY WITH TUBE PLACEMENT;  Surgeon: Leta Baptist, MD;  Location: [place];  Service: ENT;  Laterality: Bilateral;  . REVISION OF ARTERIOVENOUS GORETEX GRAFT Left 05/04/2018   Procedure: TRANSPOSITION OF CEPHALIC VEIN ARTERIOVENOUS FISTULA LEFT ARM;  Surgeon: Rosetta Posner, MD;  Location: [place];  Service: Vascular;  Laterality: Left;  . SAVORY DILATION N/A 12/18/2016   Procedure: SAVORY DILATION;  Surgeon: Danie Binder, MD;   [facility];  Service: Endoscopy;  Laterality: N/A;   Social History   Socioeconomic History  . Marital status: Married    Spouse name: Not on file  . Number of children: 2  Occupational History  . CNA  Tobacco Use  . Smoking status: Never Smoker  . Smokeless tobacco: Never Used  Substance and Sexual Activity  . Alcohol use: No  . Drug use: No   Current Outpatient Medications on File Prior to Visit  Medication Sig Dispense Refill  . acetaminophen (TYLENOL) 500 MG tablet Take 500 mg by mouth every 8 (eight) hours as needed for mild pain.     Marland Kitchen allopurinol (ZYLOPRIM) 100 MG tablet Take 1 tablet (100 mg total) by mouth every other day. Please take 100 mg by mouth on Sunday, Wednesday, and Fridays (Patient taking differently: Take 100 mg by mouth 2 (two) times daily. ) 30 tablet 6  . amLODipine (NORVASC) 10 MG tablet TAKE 1 TABLET BY MOUTH ONCE DAILY. 28 tablet 10  . cloNIDine (CATAPRES) 0.3 MG tablet TAKE 1 TABLET BY MOUTH TWICE DAILY 56 tablet 10  . dexlansoprazole (DEXILANT) 60 MG capsule TAKE 1 CAPSULE IN THE MORNING WITH FOOD (Patient taking differently: Take 60 mg by mouth daily with breakfast. ) 28 capsule 10  . ezetimibe (ZETIA) 10 MG tablet TAKE 1 TABLET BY MOUTH ONCE DAILY. (Patient taking differently: Take 10 mg by mouth daily. ) 28 tablet 5  . fluconazole (DIFLUCAN) 100 MG tablet Twice daily for 7 days    . gabapentin (NEURONTIN) 300 MG capsule TAKE (1) CAPSULE BY MOUTH THREE TIMES DAILY 84 capsule 4  . GNP ASPIRIN LOW DOSE 81 MG EC tablet TAKE 1 TABLET BY MOUTH ONCE DAILY. (Patient taking differently: Take 81 mg by mouth daily. ) 28 tablet 10  . HYDROcodone-acetaminophen (NORCO/VICODIN) 5-325 MG tablet Take on tablet by mouth once daily for pain 30 tablet 0  . [START ON 10/20/2018] HYDROcodone-acetaminophen (NORCO/VICODIN) 5-325 MG tablet Take one tablet by mouth once daily  for neck and upper extremity pain 30 tablet 0  . [START ON 11/20/2018]  HYDROcodone-acetaminophen (NORCO/VICODIN) 5-325 MG tablet Take one tablet by mouth once daily for neck and upper extremity pain 30 tablet 0  . [START ON 11/20/2018] HYDROcodone-acetaminophen (VICODIN) 5-325 mg TABS tablet Take one tablet by mouth once daily for neck and upper extremity pain 30 tablet 0  . Insulin Degludec (TRESIBA FLEXTOUCH) 200 UNIT/ML SOPN Inject 40 Units into the skin daily.    . Insulin Lispro (HUMALOG KWIKPEN) 200 UNIT/ML SOPN Inject 18-25 Units into the skin 3 (three) times daily before meals. 30 mL 5  . isosorbide mononitrate (IMDUR) 30 MG 24 hr tablet TAKE 1 TABLET BY MOUTH ONCE DAILY. (Patient taking differently: Take 30 mg by mouth daily. ) 28 tablet 5  . nystatin (MYCOSTATIN) 100000 UNIT/ML suspension Take 5 mLs (500,000 Units total) by mouth 4 (four) times daily. 60 mL 0  . olmesartan (BENICAR) 40 MG tablet TAKE 1 TABLET BY MOUTH ONCE A DAY. 30 tablet 0  . ondansetron (ZOFRAN) 4 MG tablet Take 1 tablet (4 mg total) by mouth every 8 (eight) hours as needed for nausea or vomiting. 30 tablet 1  . sertraline (ZOLOFT) 50 MG tablet Take 50 mg by mouth daily.    . sertraline (ZOLOFT) 50 MG tablet TAKE 1 TABLET BY MOUTH ONCE DAILY.    . sevelamer carbonate (RENVELA) 800 MG tablet Take 800 mg by mouth 3 (three) times daily with meals.    Marland Kitchen ULTICARE MINI PEN NEEDLES 31G X 6 MM MISC USE AS DIRECTED 100 each 10  . [DISCONTINUED] FLUoxetine (PROZAC) 10 MG capsule Take 10 mg by mouth daily.      . [DISCONTINUED] glipiZIDE (GLUCOTROL) 10 MG tablet Take 10 mg by mouth 2 (two) times daily before a meal.       No current facility-administered medications on file prior to visit.    Allergies  Allergen Reactions  . Ace Inhibitors Anaphylaxis and Swelling  . Penicillins Itching, Swelling and Other (See Comments)    Did it involve swelling of the face/tongue/throat, SOB, or low BP? Unknown Did it involve sudden or severe rash/hives, skin peeling, or any reaction on the inside of your  mouth or nose? Unknown Did you need to seek medical attention at a hospital or doctor's office? Unknown When did it last happen?years  If all above answers are "NO", may proceed with cephalosporin use.   . Statins Other (See Comments)    elevated LFT's    . Albuterol Swelling   Family History  Problem Relation Age of Onset  . Hypertension Father   . Hypercholesterolemia Father   . Arthritis Father   . Hypertension Sister   . Hypercholesterolemia Sister   . Breast cancer Sister   . Hypertension Sister   . Colon cancer Neg Hx   . Colon polyps Neg Hx     PE: BP 130/70   Pulse 70   Ht 5\' 4"  (1.626 m)   Wt 213 lb (96.6 kg)   SpO2 97%   BMI 36.56 kg/m  Wt Readings from Last 3 Encounters:  10/04/18 213 lb (96.6 kg)  09/20/18 210 lb (95.3 kg)  09/12/18 198 lb 6.6 oz (90 kg)   Constitutional: overweight, in NAD Eyes: PERRLA, EOMI, no exophthalmos ENT: moist mucous membranes, no thyromegaly, no cervical lymphadenopathy Cardiovascular: RRR, No MRG, + bilateral leg swelling, pitting Respiratory: CTA B Gastrointestinal: abdomen soft, NT, ND, BS+ Musculoskeletal: no deformities, strength intact in all 4  Skin: moist, warm, no rashes Neurological: no tremor with outstretched hands, DTR normal in all 4  ASSESSMENT: 1. DM2, insulin-dependent, uncontrolled, with complications - CKD stage 4 - PN  2. HL  3. B adrenal adenomas  PLAN:  1. Patient with longstanding, uncontrolled, type 2 diabetes, previously on a premixed insulin regimen and also long-acting insulin, now on basal-bolus insulin regimen.  Her sugars improved significantly after she started dialysis but her sugars were fluctuating at last visit since she was skipping Humalog doses.  She noticed that if she was taking the entire dose of Humalog, her sugars were too low.  We backed off Humalog then but I did advise her to take it consistently.  We also discussed about when to reduce the dose of Humalog and when to  skip it completely.  I also advised her to take [place] after dialysis since in the past she was taking it before dialysis. -At this visit, sugars are very variable, from 30s to the 500s and upon questioning she did not move [place] after dialysis, but takes it before she starts dialysis at night and also, she did not start taking Humalog before every meal, as advised, but only takes it if the sugars are higher than 160.  Also, she increased the Humalog doses whenever her sugars are high before a meal to 32 units which drops were too low after the meal. -At this visit, we again discussed about moving [place] to before dialysis, decreasing Humalog doses but she absolutely has to take them with every meal, and I also gave her a sliding scale -I reviewed most recent HbA1c from last visit and this was slightly better, at 6.8%. - I suggested to:  Patient Instructions  Please move: - Tresiba 40 units daily after dialysis  Take Humalog EVERY TIME you eat, 15 min before the meal, if sugars are >60. Please change: - Humalog 18 units before a smaller meal - Humalog 22 units before a larger meal  Please add the following sliding scale: - 150-175: + 1 unit  - 176-200: + 2 units  - 201-225: + 3 units  - 226-250: + 4 units  - 251-275: + 5 units - 276-300: + 6 units  - advised to check sugars at different times of the day - 3-4x a day, rotating check times - advised for yearly eye exams >> she is not UTD - return to clinic in 2 months    2. HL - Reviewed latest lipid panel from 02/2018: LDL slightly high, triglycerides high, HDL low Lab Results  Component Value Date   CHOL 199 03/08/2018   HDL 37 (L) 03/08/2018   LDLCALC 114 (H) 03/08/2018   LDLDIRECT 107 05/19/2016   TRIG 242 (H) 03/08/2018   CHOLHDL 5.4 03/08/2018  - Continues Zetia without side effects.  3.  Bilateral adrenal adenoma -Patient with history of bilateral adrenal adenomas per review of her abdominal MRI from 2019.  The imaging  characteristics pointed towards benign tumors, of 2.5 and 1.6 cm, respectively.  However, they have appeared to increase in size over time.  Reviewing the MRI of the abdomen from 2010 and the CT of the abdomen and pelvis from 2017, only one nodule was seen in both, in the left adrenal. -We checked plasma catecholamines, metanephrines, aldosterone and these were all normal.  However, cortisol after dexamethasone suppression was high, however, I suspect that this was due to incomplete clearance of cortisol due to her end-stage renal disease, and not actually increased  cortisol production.  We cannot check a 24-hour urine she is on dialysis I plan to check with the lab if we can do a late-night salivary cortisol, however, she started around 9 PM and the test should be done around 10-11 PM, so even this test will be a challenge for her... In this case, we may just need to follow her clinically. -We will repeat the adrenal work-up today excluding cortisol  Orders Placed This Encounter  Procedures  . Potassium  . Metanephrines, plasma  . Catecholamines, fractionated, plasma  . Aldosterone + renin activity w/ ratio   Component     Latest Ref Rng & Units 10/04/2018          Epinephrine     pg/mL 24  Norepinephrine     pg/mL 167 (L)  Dopamine     pg/mL <10  Total Catecholamines     pg/mL 191 (L)  Metanephrine, Pl     <=57 pg/mL <25  Normetanephrine, Pl     <=148 pg/mL 38  Total Metanephrines-Plasma     <=205 pg/mL 38  ALDOSTERONE      ng/dL 2  Renin Activity     0.25 - 5.82 ng/mL/h 1.48  ALDO / PRA Ratio     0.9 - 28.9 Ratio 1.4  Potassium     3.5 - 5.1 mEq/L 4.9   Adrenal labs normal.  [name], MD PhD [facility] Endocrinology

## 2018-10-06 NOTE — Addendum Note (Signed)
Addended by: Eual Fines on: 10/06/2018 05:05 PM   Modules accepted: Orders

## 2018-10-06 NOTE — Telephone Encounter (Signed)
Note is amended to support PT for unsteady gait due to neuropathy , recommend twice weekly for 6 weeks, thanks

## 2018-10-06 NOTE — Assessment & Plan Note (Signed)
Unsteady gait secondary to neuropathy and lower extremity weakness, recommend physical therapy twice weekly x  Weeks for strengthening and fall risk reduction

## 2018-10-06 NOTE — Telephone Encounter (Signed)
Referred to [facility]

## 2018-10-11 LAB — CATECHOLAMINES, FRACTIONATED, PLASMA
Dopamine: <10 pg/mL
Epinephrine: 24 pg/mL
Norepinephrine: 167 pg/mL — ABNORMAL LOW
Total Catecholamines: 191 pg/mL — ABNORMAL LOW

## 2018-10-11 LAB — ALDOSTERONE + RENIN ACTIVITY W/ RATIO
ALDO / PRA Ratio: 1.4 Ratio (ref 0.9–28.9)
Aldosterone: 2 ng/dL
Renin Activity: 1.48 ng/mL/h (ref 0.25–5.82)

## 2018-10-11 LAB — METANEPHRINES, PLASMA
Metanephrine, Free: <25 pg/mL (ref <=57)
Normetanephrine, Free: 38 pg/mL (ref <=148)
Total Metanephrines-Plasma: 38 pg/mL (ref <=205)

## 2018-10-12 ENCOUNTER — Telehealth: Payer: Self-pay

## 2018-10-12 NOTE — Telephone Encounter (Signed)
-----   Message from [name], MD sent at 10/12/2018  8:20 AM EDT ----- [name], can you please call pt: Kathryn Beck adrenal labs are all normal.

## 2018-10-12 NOTE — Telephone Encounter (Signed)
Left message for patient to return our call at [phone].  

## 2018-10-17 ENCOUNTER — Telehealth: Payer: Self-pay | Admitting: Family Medicine

## 2018-10-17 ENCOUNTER — Other Ambulatory Visit: Payer: Self-pay | Admitting: Family Medicine

## 2018-10-17 NOTE — Telephone Encounter (Signed)
I called the pt and she is taking hydrocodone for pain, so no need to prescribe tramadol

## 2018-10-18 ENCOUNTER — Telehealth: Payer: Self-pay | Admitting: Family Medicine

## 2018-10-18 NOTE — Telephone Encounter (Signed)
Amy is calling to let you know that the insurance has not approved this yet, they are waiting on a response

## 2018-10-18 NOTE — Telephone Encounter (Signed)
noted 

## 2018-10-21 ENCOUNTER — Telehealth: Payer: Self-pay | Admitting: Physician Assistant

## 2018-10-21 NOTE — Telephone Encounter (Signed)
PATIENT HAS SCALE AND BP CUFF     Virtual Visit Pre-Appointment Phone Call  "(Name), I am calling you today to discuss your upcoming appointment. We are currently trying to limit exposure to the virus that causes COVID-19 by seeing patients at home rather than in the office."  1. "What is the BEST phone number to call the day of the visit?" - include this in appointment notes  2. Do you have or have access to (through a family member/friend) a smartphone with video capability that we can use for your visit?" a. If yes - list this number in appt notes as cell (if different from BEST phone #) and list the appointment type as a VIDEO visit in appointment notes b. If no - list the appointment type as a PHONE visit in appointment notes  3. Confirm consent - "In the setting of the current Covid19 crisis, you are scheduled for a (phone or video) visit with your provider on (date) at (time).  Just as we do with many in-office visits, in order for you to participate in this visit, we must obtain consent.  If you'd like, I can send this to your mychart (if signed up) or email for you to review.  Otherwise, I can obtain your verbal consent now.  All virtual visits are billed to your insurance company just like a normal visit would be.  By agreeing to a virtual visit, we'd like you to understand that the technology does not allow for your provider to perform an examination, and thus may limit your provider's ability to fully assess your condition. If your provider identifies any concerns that need to be evaluated in person, we will make arrangements to do so.  Finally, though the technology is pretty good, we cannot assure that it will always work on either your or our end, and in the setting of a video visit, we may have to convert it to a phone-only visit.  In either situation, we cannot ensure that we have a secure connection.  Are you willing to proceed?" STAFF: Did the patient verbally acknowledge consent  to telehealth visit? Document YES/NO here: YES  4. Advise patient to be prepared - "Two hours prior to your appointment, go ahead and check your blood pressure, pulse, oxygen saturation, and your weight (if you have the equipment to check those) and write them all down. When your visit starts, your provider will ask you for this information. If you have an Apple Watch or Kardia device, please plan to have heart rate information ready on the day of your appointment. Please have a pen and paper handy nearby the day of the visit as well."  5. Give patient instructions for MyChart download to smartphone OR Doximity/Doxy.me as below if video visit (depending on what platform provider is using)  6. Inform patient they will receive a phone call 15 minutes prior to their appointment time (may be from unknown caller ID) so they should be prepared to answer    [facility] has been deemed a candidate for a follow-up tele-health visit to limit community exposure during the Covid-19 pandemic. I spoke with the patient via phone to ensure availability of phone/video source, confirm preferred email & phone number, and discuss instructions and expectations.  I reminded YORLEY BUCH to be prepared with any vital sign and/or heart rhythm information that could potentially be obtained via home monitoring, at the time of her visit. I reminded Gayathri Futrell  Monceaux to expect a phone call prior to her visit.  Howie Ill 10/21/2018 10:37 AM   INSTRUCTIONS FOR DOWNLOADING THE MYCHART APP TO SMARTPHONE  - The patient must first make sure to have activated MyChart and know their login information - If Apple, go to [employer] and type in MyChart in the search bar and download the app. If Android, ask patient to go to [employer] and type in [place] in the search bar and download the app. The app is free but as with any other app downloads, their phone may require them to verify saved payment  information or Apple/Android password.  - The patient will need to then log into the app with their MyChart username and password, and select [facility] as their healthcare provider to link the account. When it is time for your visit, go to the MyChart app, find appointments, and click Begin Video Visit. Be sure to Select Allow for your device to access the Microphone and Camera for your visit. You will then be connected, and your provider will be with you shortly.  **If they have any issues connecting, or need assistance please contact MyChart service desk [phone])**  **If using a computer, in order to ensure the best quality for their visit they will need to use either of the following Internet Browsers: [employer], or Google Chrome**  IF USING DOXIMITY or DOXY.ME - The patient will receive a link just prior to their visit by text.     FULL LENGTH CONSENT FOR TELE-HEALTH VISIT   I hereby voluntarily request, consent and authorize [facility] and its employed or contracted physicians, physician assistants, nurse practitioners or other licensed health care professionals (the Practitioner), to provide me with telemedicine health care services (the Services") as deemed necessary by the treating Practitioner. I acknowledge and consent to receive the Services by the Practitioner via telemedicine. I understand that the telemedicine visit will involve communicating with the Practitioner through live audiovisual communication technology and the disclosure of certain medical information by electronic transmission. I acknowledge that I have been given the opportunity to request an in-person assessment or other available alternative prior to the telemedicine visit and am voluntarily participating in the telemedicine visit.  I understand that I have the right to withhold or withdraw my consent to the use of telemedicine in the course of my care at any time, without affecting my right  to future care or treatment, and that the Practitioner or I may terminate the telemedicine visit at any time. I understand that I have the right to inspect all information obtained and/or recorded in the course of the telemedicine visit and may receive copies of available information for a reasonable fee.  I understand that some of the potential risks of receiving the Services via telemedicine include:   Delay or interruption in medical evaluation due to technological equipment failure or disruption;  Information transmitted may not be sufficient (e.g. poor resolution of images) to allow for appropriate medical decision making by the Practitioner; and/or   In rare instances, security protocols could fail, causing a breach of personal health information.  Furthermore, I acknowledge that it is my responsibility to provide information about my medical history, conditions and care that is complete and accurate to the best of my ability. I acknowledge that Practitioner's advice, recommendations, and/or decision may be based on factors not within their control, such as incomplete or inaccurate data provided by me or distortions of diagnostic images or  specimens that may result from electronic transmissions. I understand that the practice of medicine is not an exact science and that Practitioner makes no warranties or guarantees regarding treatment outcomes. I acknowledge that I will receive a copy of this consent concurrently upon execution via email to the email address I last provided but may also request a printed copy by calling the office of [facility].    I understand that my insurance will be billed for this visit.   I have read or had this consent read to me.  I understand the contents of this consent, which adequately explains the benefits and risks of the Services being provided via telemedicine.   I have been provided ample opportunity to ask questions regarding this consent and the Services  and have had my questions answered to my satisfaction.  I give my informed consent for the services to be provided through the use of telemedicine in my medical care  By participating in this telemedicine visit I agree to the above.

## 2018-10-22 ENCOUNTER — Other Ambulatory Visit: Payer: Self-pay | Admitting: Family Medicine

## 2018-10-25 ENCOUNTER — Other Ambulatory Visit (HOSPITAL_BASED_OUTPATIENT_CLINIC_OR_DEPARTMENT_OTHER): Payer: Self-pay

## 2018-10-25 DIAGNOSIS — G4733 Obstructive sleep apnea (adult) (pediatric): Secondary | ICD-10-CM

## 2018-10-26 ENCOUNTER — Other Ambulatory Visit (HOSPITAL_BASED_OUTPATIENT_CLINIC_OR_DEPARTMENT_OTHER): Payer: Self-pay

## 2018-10-26 NOTE — Progress Notes (Signed)
Virtual Visit via Telephone Note   This visit type was conducted due to national recommendations for restrictions regarding the COVID-19 Pandemic (e.g. social distancing) in an effort to limit this patient's exposure and mitigate transmission in our community.  Due to her co-morbid illnesses, this patient is at least at moderate risk for complications without adequate follow up.  This format is felt to be most appropriate for this patient at this time.  The patient did not have access to video technology/had technical difficulties with video requiring transitioning to audio format only (telephone).  All issues noted in this document were discussed and addressed.  No physical exam could be performed with this format.  Please refer to the patient's chart for her  consent to telehealth for [facility].  Evaluation Performed:  Follow-up visit  This visit type was conducted due to national recommendations for restrictions regarding the COVID-19 Pandemic (e.g. social distancing).  This format is felt to be most appropriate for this patient at this time.  All issues noted in this document were discussed and addressed.  No physical exam was performed (except for noted visual exam findings with Video Visits).  Please refer to the patient's chart (MyChart message for video visits and phone note for telephone visits) for the patient's consent to telehealth for [facility].  Date:  10/27/2018   ID:  Kathryn Beck, DOB 1955-11-01, MRN [id number]  Patient Location:  Home  Provider location:   Office  PCP:  Fayrene Helper, MD  Cardiologist:  Rozann Lesches, MD  12/15/2017 Electrophysiologist:  None   Chief Complaint:  Post-hospital follow up  History of Present Illness:    Kathryn Beck is a 63 y.o. female who presents via audio/video conferencing for a telehealth visit today.    63 y.o. 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  She still has some lower extremity edema, but generally feels better.  She does not feel as wiped out with peritoneal dialysis as she did with HD.  She is trying to be compliant with dietary restrictions.  She has an appointment today to go see the dialysis team.  They are working with her to get her weight down and get rid of the swelling.  Even if they are not able to do that, she likes peritoneal dialysis better than HD.  Has appt 08/14 for CPAP, the machine quit working.  She denies orthopnea.  She has some chronic PND that has not changed recently.  She has some lower extremity edema, but is not as bad as it used to be.  [facility] app[facility] w/ [facility] 09[facility].  If they require an ischemic evaluation with cardiac catheterization, she prefers it to be done in [place].  She has not had chest pain, at rest or with exertion.  She does not exercise, but has been working with physical therapy.  She has not had any chest pain with physical therapy.  That is the most strenuous thing that she does.  No palpitations, no presyncope or syncope.  The patient does not have symptoms concerning for COVID-19 infection (fever,  chills, cough, or new shortness of breath).    Prior CV studies:   The following studies were reviewed today:  ECHO: 09/01/2017 - Left ventricle: The cavity size was normal. Systolic function was   normal. The estimated ejection fraction was in the range of 55%   to 60%. Wall motion was normal; there were no regional wall   motion abnormalities. Doppler parameters are consistent with   abnormal left ventricular relaxation (grade 1 diastolic dysfunction). - Left atrium: The atrium was mildly dilated.  Lexiscan Myoview  07/06/2016:  There was no ST segment deviation noted during stress.  Findings consistent with prior moderate anterior myocardial infarction with moderate peri-infarct ischemia. SSS 10 SRS 4 SDS 6  The left ventricular ejection fraction is normal (55-65%).  This is an intermediate risk study. (per Dr Harrington Challenger' note 09/03/2017, not clearly infarct, ?breast attn)   Past Medical History:  Diagnosis Date   Acid reflux    Anemia    Anxiety    clausterophoic   Arthritis    Axillary masses    Soft tissue - status post excision   Back pain    CHF (congestive heart failure) (HCC)    Depression    Dialysis patient ([place])    Diastolic heart failure ([place])    End-stage renal disease ([place])    TTHSat  in [place] hypertension    History of blood transfusion    Ischemic heart disease    Abnormal Myoview April 2018 - medical therapy   Mixed hyperlipidemia    Obesity    Pancreatitis    Peritoneal dialysis catheter in place [facility])    Pneumonia 2019   two times in 2019   Sleep apnea    Noncompliant with CPAP   Type 2 diabetes mellitus, uncontrolled ([place])    Type II   Past Surgical History:  Procedure Laterality Date   ABDOMINAL HYSTERECTOMY     AV FISTULA PLACEMENT Left 09/02/2017   Procedure: creation of left arm ARTERIOVENOUS (AV) FISTULA;  Surgeon: Serafina Mitchell, MD;  Location: [facility] OR;  Service: Vascular;  Laterality: Left;   COLONOSCOPY  2008   Dr. Oneida Alar: normal    COLONOSCOPY N/A 12/18/2016   Dr. Oneida Alar: multiple tubular adenomas, internal hemorrhoids. Surveillance in 3 years    ESOPHAGEAL DILATION N/A 10/13/2015   Procedure: ESOPHAGEAL DILATION;  Surgeon: Rogene Houston, MD;  Location: [facility] ENDO SUITE;  Service: Endoscopy;  Laterality: N/A;   ESOPHAGOGASTRODUODENOSCOPY N/A 10/13/2015   Dr. Laural Golden: chronic gastritis on path, no H.pylori. Empiric dilation    ESOPHAGOGASTRODUODENOSCOPY N/A 12/18/2016   Dr. Oneida Alar: mild gastritis. BRAVO study  revealed uncontrolled GERD. Dysphagia secondary to uncontrolled reflux   FOOT SURGERY Bilateral    "nerve"     LUNG BIOPSY     MASS EXCISION Right 01/09/2013   Procedure: EXCISION OF NEOPLASM OF RIGHT  AXILLA  AND EXCISION OF NEOPLASM OF LEFT AXILLA;  Surgeon: Jamesetta So, MD;  Location: [facility] ORS;  Service: General;  Laterality: Right;  procedure end @ 08:23   MYRINGOTOMY WITH TUBE PLACEMENT Bilateral 04/28/2017   Procedure: BILATERAL MYRINGOTOMY WITH TUBE PLACEMENT;  Surgeon: Leta Baptist, MD;  Location: [place];  Service: ENT;  Laterality: Bilateral;   REVISION OF ARTERIOVENOUS GORETEX GRAFT Left 05/04/2018   Procedure: TRANSPOSITION OF CEPHALIC VEIN ARTERIOVENOUS FISTULA LEFT ARM;  Surgeon: Rosetta Posner, MD;  Location: [facility];  Service: Vascular;  Laterality: Left;   SAVORY DILATION N/A 12/18/2016   Procedure: SAVORY DILATION;  Surgeon: Danie Binder, MD;  Location[facility] END[facility];  Service: Endoscopy;  Laterality: N/A;     Current Meds  Medication Sig   acetaminophen (TYLENOL) 500 MG tablet Take 500 mg by mouth every 8 (eight) hours as needed for mild pain.    allopurinol (ZYLOPRIM) 100 MG tablet Take 1 tablet (100 mg total) by mouth every other day. Please take 100 mg by mouth on Sunday, Wednesday, and Fridays (Patient taking differently: Take 100 mg by mouth 2 (two) times daily. )   amLODipine (NORVASC) 10 MG tablet TAKE 1 TABLET BY MOUTH ONCE DAILY.   cloNIDine (CATAPRES) 0.3 MG tablet Take 1 tablet at 6 AM take 1/2 tablet at 2 PM and take 1 tablet at 10 PM every day   dexlansoprazole (DEXILANT) 60 MG capsule TAKE 1 CAPSULE IN THE MORNING WITH FOOD (Patient taking differently: Take 60 mg by mouth daily with breakfast. )   ezetimibe (ZETIA) 10 MG tablet TAKE 1 TABLET BY MOUTH ONCE DAILY.   gabapentin (NEURONTIN) 300 MG capsule TAKE (1) CAPSULE BY MOUTH THREE TIMES DAILY   GNP ASPIRIN LOW DOSE 81 MG EC tablet TAKE 1 TABLET BY MOUTH ONCE DAILY. (Patient taking differently: Take 81 mg  by mouth daily. )   HYDROcodone-acetaminophen (NORCO/VICODIN) 5-325 MG tablet Take 1 tablet by mouth daily as needed for moderate pain.   Insulin Degludec (TRESIBA FLEXTOUCH) 200 UNIT/ML SOPN Inject 40 Units into the skin daily.   Insulin Lispro (HUMALOG KWIKPEN) 200 UNIT/ML SOPN Inject 18-25 Units into the skin 3 (three) times daily before meals.   isosorbide mononitrate (IMDUR) 30 MG 24 hr tablet TAKE 1 TABLET BY MOUTH ONCE DAILY.   nystatin (MYCOSTATIN) 100000 UNIT/ML suspension Take 5 mLs (500,000 Units total) by mouth 4 (four) times daily.   olmesartan (BENICAR) 40 MG tablet TAKE 1 TABLET BY MOUTH ONCE A DAY.   ondansetron (ZOFRAN) 4 MG tablet Take 1 tablet (4 mg total) by mouth every 8 (eight) hours as needed for nausea or vomiting.   sertraline (ZOLOFT) 50 MG tablet Take 50 mg by mouth daily.   ULTICARE MINI PEN NEEDLES 31G X 6 MM MISC USE AS DIRECTED   [DISCONTINUED] sevelamer carbonate (RENVELA) 800 MG tablet Take 800 mg by mouth 3 (three) times daily with meals.     Allergies:   Ace inhibitors, Penicillins, Statins, and Albuterol   Social History   Tobacco Use   Smoking status: Never Smoker   Smokeless tobacco: Never Used  Substance Use Topics   Alcohol use: No   Drug use: No     Family Hx: The patient's family history includes Arthritis in her father; Breast cancer in her sister; Hypercholesterolemia in her father and sister; Hypertension in her father, sister, and sister. There is no history of Colon cancer or Colon polyps.  ROS:   Please see the history of present illness.    All other systems reviewed and are negative.   Labs/Other Tests and Data Reviewed:    Recent Labs: 09/03/2018: Magnesium 1.8 09/12/2018: ALT 33; B Natriuretic Peptide 283.0; BUN 33; Creatinine, Ser 5.09; Hemoglobin 8.5; Platelets 315; Sodium 142 10/04/2018: Potassium 4.9      Component Value Date/Time   WBC 7.4 09/12/2018 0915   RBC 3.60 (L) 09/12/2018 0915   HGB 8.5 (L)  09/12/2018 0915   HCT 28.9 (L) 09/12/2018 0915   PLT 315 09/12/2018 0915   MCV 80.3 09/12/2018 0915   MCH 23.6 (L) 09/12/2018 0915   MCHC 29.4 (L)  09/12/2018 0915   RDW 16.3 (H) 09/12/2018 0915   LYMPHSABS 1.8 09/03/2018 0810   MONOABS 0.5 09/03/2018 0810   EOSABS 0.1 09/03/2018 0810   BASOSABS 0.0 09/03/2018 0810   CMP Latest Ref Rng & Units 10/04/2018 09/12/2018 09/03/2018  Glucose 70 - 99 mg/dL - 144(H) 242(H)  BUN 8 - 23 mg/dL - 33(H) 50(H)  Creatinine 0.44 - 1.00 mg/dL - 5.09(H) 6.00(H)  Sodium 135 - 145 mmol/L - 142 139  Potassium 3.5 - 5.1 mEq/L 4.9 5.2(H) 4.8  Chloride 98 - 111 mmol/L - 104 100  CO2 22 - 32 mmol/L - 28 26  Calcium 8.9 - 10.3 mg/dL - 8.8(L) 8.9  Total Protein 6.5 - 8.1 g/dL - 7.4 7.3  Total Bilirubin 0.3 - 1.2 mg/dL - 0.2(L) 0.4  Alkaline Phos 38 - 126 U/L - 139(H) 129(H)  AST 15 - 41 U/L - 25 17  ALT 0 - 44 U/L - 33 24   Recent Lipid Panel Lab Results  Component Value Date/Time   CHOL 199 03/08/2018 12:34 PM   TRIG 242 (H) 03/08/2018 12:34 PM   HDL 37 (L) 03/08/2018 12:34 PM   CHOLHDL 5.4 03/08/2018 12:34 PM   LDLCALC 114 (H) 03/08/2018 12:34 PM   LDLCALC 109 (H) 11/12/2017 09:36 AM   LDLDIRECT 107 05/19/2016 01:44 PM    Wt Readings from Last 3 Encounters:  10/27/18 222 lb (100.7 kg)  10/27/18 222 lb (100.7 kg)  10/04/18 213 lb (96.6 kg)     Objective:    Vital Signs:  BP (!) 148/69    Ht 5\' 4"  (1.626 m)    Wt 222 lb (100.7 kg)    BMI 38.11 kg/m    63 y.o. female in no acute distress over the phone.   ASSESSMENT & PLAN:    1.  Chronic diastolic CHF: - Volume management is by peritoneal dialysis -She is working with them to get her dry weight down -She is encouraged to be diet and fluid compliant and and do the peritoneal dialysis as directed  2.  Ischemic heart disease: - She has had chest pain in the past, but is not having any at this time. - Her stress test was abnormal, but cath was not pursued at that time because she was  predialysis - Since she went on dialysis, she has not really been bothered by chest pain. - She was on aspirin when Dr. Donnetta Hutching saw her 06/06/2018, but that is the last note that included aspirin.  There was no obvious bleeding issues or other issues - Dr. Domenic Polite to address if she should restart aspirin. -Up to 2019, she was on labetalol.  The reason she is no longer on a beta-blocker is not clear. -Dr. Domenic Polite to address if she should be started on a beta-blocker  3.  Hypertension: - Dr. Moshe Cipro follows this closely - Management per Dr. Moshe Cipro, but her blood pressure generally runs high enough that I believe she could tolerate some beta-blocker  4.  Hyperlipidemia: - She has been intolerant of statins, currently on Zetia -Last lipid profile was above  COVID-19 Education: The signs and symptoms of COVID-19 were discussed with the patient and how to seek care for testing (follow up with PCP or arrange E-visit).  The importance of social distancing was discussed today.  Patient Risk:   After full review of this patient's clinical status, I feel that they are at least moderate risk at this time.  Time:   Today, I have  spent 15 minutes with the patient with telehealth technology discussing cardiology and other health issues.     Medication Adjustments/Labs and Tests Ordered: Current medicines are reviewed at length with the patient today.  Concerns regarding medicines are outlined above.  Tests Ordered: No orders of the defined types were placed in this encounter.  Medication Changes: No orders of the defined types were placed in this encounter.   Disposition:  Follow up with Rozann Lesches, MD   Signed, Rosaria Ferries, PA-C  10/27/2018 11:54 AM    [place]

## 2018-10-27 ENCOUNTER — Other Ambulatory Visit: Payer: Self-pay

## 2018-10-27 ENCOUNTER — Encounter: Payer: Self-pay | Admitting: Family Medicine

## 2018-10-27 ENCOUNTER — Encounter: Payer: Self-pay | Admitting: Physician Assistant

## 2018-10-27 ENCOUNTER — Ambulatory Visit (INDEPENDENT_AMBULATORY_CARE_PROVIDER_SITE_OTHER): Payer: 59 | Admitting: Family Medicine

## 2018-10-27 ENCOUNTER — Telehealth (INDEPENDENT_AMBULATORY_CARE_PROVIDER_SITE_OTHER): Payer: 59 | Admitting: Physician Assistant

## 2018-10-27 VITALS — BP 148/70 | HR 87 | Temp 97.3°F | Resp 15 | Ht 64.0 in | Wt 222.0 lb

## 2018-10-27 VITALS — BP 148/69 | Ht 64.0 in | Wt 222.0 lb

## 2018-10-27 DIAGNOSIS — I152 Hypertension secondary to endocrine disorders: Secondary | ICD-10-CM

## 2018-10-27 DIAGNOSIS — E1159 Type 2 diabetes mellitus with other circulatory complications: Secondary | ICD-10-CM

## 2018-10-27 DIAGNOSIS — E1122 Type 2 diabetes mellitus with diabetic chronic kidney disease: Secondary | ICD-10-CM

## 2018-10-27 DIAGNOSIS — Z992 Dependence on renal dialysis: Secondary | ICD-10-CM

## 2018-10-27 DIAGNOSIS — I5032 Chronic diastolic (congestive) heart failure: Secondary | ICD-10-CM | POA: Diagnosis not present

## 2018-10-27 DIAGNOSIS — I259 Chronic ischemic heart disease, unspecified: Secondary | ICD-10-CM | POA: Diagnosis not present

## 2018-10-27 DIAGNOSIS — I10 Essential (primary) hypertension: Secondary | ICD-10-CM

## 2018-10-27 DIAGNOSIS — N186 End stage renal disease: Secondary | ICD-10-CM

## 2018-10-27 DIAGNOSIS — E782 Mixed hyperlipidemia: Secondary | ICD-10-CM | POA: Diagnosis not present

## 2018-10-27 MED ORDER — CLONIDINE HCL 0.3 MG PO TABS: ORAL_TABLET | ORAL | 5 refills | Status: DC

## 2018-10-27 NOTE — Assessment & Plan Note (Signed)
Uncontrolled, ad d half clonidine at 2 pm DASH diet and commitment to daily physical activity for a minimum of 30 minutes discussed and encouraged, as a part of hypertension management. The importance of attaining a healthy weight is also discussed.  BP/Weight 10/27/2018 10/04/2018 09/20/2018 09/12/2018 09/03/2018 09/02/2018 2/42/6834  Systolic BP 196 222 979 892 119 417 408  Diastolic BP 70 70 64 72 89 62 80  Wt. (Lbs) 222 213 210 198.41 187 - 215  BMI 38.11 36.56 36.05 34.06 32.1 - 36.9

## 2018-10-27 NOTE — Patient Instructions (Signed)
Medication Instructions:  Your physician recommends that you continue on your current medications as directed. Please refer to the Current Medication list given to you today.  If you need a refill on your cardiac medications before your next appointment, please call your pharmacy.   Lab work: NONE  If you have labs (blood work) drawn today and your tests are completely normal, you will receive your results only by: Marland Kitchen MyChart Message (if you have MyChart) OR . A paper copy in the mail If you have any lab test that is abnormal or we need to change your treatment, we will call you to review the results.  Testing/Procedures: NONE   Follow-Up: At [facility], you and your health needs are our priority.  As part of our continuing mission to provide you with exceptional heart care, we have created designated Provider Care Teams.  These Care Teams include your primary Cardiologist (physician) and Advanced Practice Providers (APPs -  Physician Assistants and Nurse Practitioners) who all work together to provide you with the care you need, when you need it. You will need a follow up appointment in 1 years.  Please call our office 2 months in advance to schedule this appointment.  You may see Rozann Lesches, MD or one of the following Advanced Practice Providers on your designated Care Team:   Bernerd Pho, PA-C [facility]) . Ermalinda Barrios, PA-C ([facility])  Any Other Special Instructions Will Be Listed Below (If Applicable). Thank you for choosing [facility]!

## 2018-10-27 NOTE — Patient Instructions (Signed)
Follow-up as before call if you need me sooner.  For your blood pressure the dose is increased starting today.  Take an additional one half clonidine tablet at 2 PM every day.  Take clonidine 1 tablet at 6 AM and the evening tablet at 10 PM  As discussed please follow through with dialysis center regarding effectiveness of your current peritoneal dialysis regimen \ Thanks for choosing [facility], we consider it a privelige to serve you.   Social distancing. Frequent hand washing with soap and water Keeping your hands off of your face. These 3 practices will help to keep both you and your community healthy during this time. Please practice them faithfully!

## 2018-10-28 NOTE — Telephone Encounter (Signed)
Informed patient of information below.

## 2018-10-31 ENCOUNTER — Encounter: Payer: Self-pay | Admitting: Family Medicine

## 2018-10-31 DIAGNOSIS — Z992 Dependence on renal dialysis: Secondary | ICD-10-CM | POA: Insufficient documentation

## 2018-10-31 NOTE — Progress Notes (Signed)
Kathryn Beck     MRN: [id number]      DOB: April 25, 1955   HPI Kathryn Beck is here with a c/o leg swelling, wheezing a SOB and uncontrolled blood pressure, all since starting peritoneal dialysis , which is being adjusted. Feel and I believe rightly so, that her main problem stems from inadequate dialysis, has f/u at center this week  ROS Denies recent fever or chills. Denies sinus pressure, nasal congestion, ear pain or sore throat. Denies chest congestion, productive cough  Denies chest pains, palpitations  Denies abdominal pain, nausea, vomiting,diarrhea or constipation.   Denies dysuria, frequency, hesitancy or incontinence. C/o  joint pain, swelling and limitation in mobility. Denies headaches, seizures, numbness, or tingling. Denies depression, anxiety or insomnia. Denies skin break down or rash.   PE  BP (!) 148/70   Pulse 87   Temp (!) 97.3 F (36.3 C) (Temporal)   Resp 15   Ht 5\' 4"  (1.626 m)   Wt 222 lb (100.7 kg)   SpO2 98%   BMI 38.11 kg/m   Patient alert and oriented and in no cardiopulmonary distress.  HEENT: No facial asymmetry, EOMI,   oropharynx pink and moist.  Neck supple no JVD, no mass.  Chest: Clear to auscultation bilaterally.  CVS: S1, S2 no murmurs, no S3.Regular rate.  ABD: Soft non tender.   Ext: 2 plus pitting  edema  MS: Adequate though reduced  ROM spine, shoulders, hips and knees.  Skin: Intact, no ulcerations or rash noted.  Psych: Good eye contact, normal affect. Memory intact not anxious or depressed appearing.  CNS: CN 2-12 intact, power,  normal throughout.no focal deficits noted.   Assessment & Plan  Hypertension associated with diabetes ([place]) Uncontrolled, ad d half clonidine at 2 pm DASH diet and commitment to daily physical activity for a minimum of 30 minutes discussed and encouraged, as a part of hypertension management. The importance of attaining a healthy weight is also discussed.  BP/Weight 10/27/2018 10/04/2018  09/20/2018 09/12/2018 09/03/2018 09/02/2018 7/56/4332  Systolic BP 951 884 166 063 016 010 932  Diastolic BP 70 70 64 72 89 62 80  Wt. (Lbs) 222 213 210 198.41 187 - 215  BMI 38.11 36.56 36.05 34.06 32.1 - 36.9       Malignant hypertension Uncontrolled, increase clonidine  Dose DASH diet and commitment to daily physical activity for a minimum of 30 minutes discussed and encouraged, as a part of hypertension management. The importance of attaining a healthy weight is also discussed.  BP/Weight 10/27/2018 10/27/2018 10/04/2018 09/20/2018 09/12/2018 09/03/2018 3/55/7322  Systolic BP 025 427 062 376 283 151 761  Diastolic BP 70 69 70 64 72 89 62  Wt. (Lbs) 222 222 213 210 198.41 187 -  BMI 38.11 38.11 36.56 36.05 34.06 32.1 -       Peritoneal dialysis status (HCC) recetn start on pD, with possible inadequate dialysis, with leg edema and increased SOB, pt to contact dialysis center for reassessment of settings   Type 2 diabetes mellitus with ESRD (end-stage renal disease) ([place]) Kathryn Beck is reminded of the importance of commitment to daily physical activity for 30 minutes or more, as able and the need to limit carbohydrate intake to 30 to 60 grams per meal to help with blood sugar control.   The need to take medication as prescribed, test blood sugar as directed, and to call between visits if there is a concern that blood sugar is uncontrolled is also discussed.  Kathryn Beck is reminded of the importance of daily foot exam, annual eye examination, and good blood sugar, blood pressure and cholesterol control.  Diabetic Labs Latest Ref Rng & Units 09/12/2018 09/03/2018 08/31/2018 06/09/2018 04/22/2018  HbA1c 4.0 - 5.6 % - - 6.8(A) 6.9(H) -  Microalbumin Not estab mg/dL - - - - -  Micro/Creat Ratio <30 mcg/mg creat - - - - -  Chol 0 - 200 mg/dL - - - - -  HDL >40 mg/dL - - - - -  Calc LDL 0 - 99 mg/dL - - - - -  Triglycerides <150 mg/dL - - - - -  Creatinine 0.44 - 1.00 mg/dL 5.09(H) 6.00(H) - -  3.49(H)   BP/Weight 10/27/2018 10/27/2018 10/04/2018 09/20/2018 09/12/2018 09/03/2018 6/96/2952  Systolic BP 841 324 401 027 253 664 403  Diastolic BP 70 69 70 64 72 89 62  Wt. (Lbs) 222 222 213 210 198.41 187 -  BMI 38.11 38.11 36.56 36.05 34.06 32.1 -   Foot/eye exam completion dates Latest Ref Rng & Units 12/20/2017 08/24/2016  Eye Exam No Retinopathy - -  Foot Form Completion - Done Done   Veey well controlled and manged by endo     Morbid obesity ([place]) Obesity linked with hypertension and diabetes  Patient re-educated about  the importance of commitment to a  minimum of 150 minutes of exercise per week as able.  The importance of healthy food choices with portion control discussed, as well as eating regularly and within a 12 hour window most days. The need to choose "clean , green" food 50 to 75% of the time is discussed, as well as to make water the primary drink and set a goal of 64 ounces water daily.    Weight /BMI 10/27/2018 10/27/2018 10/04/2018  WEIGHT 222 lb 222 lb 213 lb  HEIGHT 5\' 4"  5\' 4"  5\' 4"   BMI 38.11 kg/m2 38.11 kg/m2 36.56 kg/m2     '

## 2018-10-31 NOTE — Assessment & Plan Note (Signed)
Obesity linked with hypertension and diabetes  Patient re-educated about  the importance of commitment to a  minimum of 150 minutes of exercise per week as able.  The importance of healthy food choices with portion control discussed, as well as eating regularly and within a 12 hour window most days. The need to choose "clean , green" food 50 to 75% of the time is discussed, as well as to make water the primary drink and set a goal of 64 ounces water daily.    Weight /BMI 10/27/2018 10/27/2018 10/04/2018  WEIGHT 222 lb 222 lb 213 lb  HEIGHT 5\' 4"  5\' 4"  5\' 4"   BMI 38.11 kg/m2 38.11 kg/m2 36.56 kg/m2

## 2018-10-31 NOTE — Assessment & Plan Note (Signed)
Kathryn Beck is reminded of the importance of commitment to daily physical activity for 30 minutes or more, as able and the need to limit carbohydrate intake to 30 to 60 grams per meal to help with blood sugar control.   The need to take medication as prescribed, test blood sugar as directed, and to call between visits if there is a concern that blood sugar is uncontrolled is also discussed.   Kathryn Beck is reminded of the importance of daily foot exam, annual eye examination, and good blood sugar, blood pressure and cholesterol control.  Diabetic Labs Latest Ref Rng & Units 09/12/2018 09/03/2018 08/31/2018 06/09/2018 04/22/2018  HbA1c 4.0 - 5.6 % - - 6.8(A) 6.9(H) -  Microalbumin Not estab mg/dL - - - - -  Micro/Creat Ratio <30 mcg/mg creat - - - - -  Chol 0 - 200 mg/dL - - - - -  HDL >40 mg/dL - - - - -  Calc LDL 0 - 99 mg/dL - - - - -  Triglycerides <150 mg/dL - - - - -  Creatinine 0.44 - 1.00 mg/dL 5.09(H) 6.00(H) - - 3.49(H)   BP/Weight 10/27/2018 10/27/2018 10/04/2018 09/20/2018 09/12/2018 09/03/2018 3/41/9379  Systolic BP 024 097 353 299 242 683 419  Diastolic BP 70 69 70 64 72 89 62  Wt. (Lbs) 222 222 213 210 198.41 187 -  BMI 38.11 38.11 36.56 36.05 34.06 32.1 -   Foot/eye exam completion dates Latest Ref Rng & Units 12/20/2017 08/24/2016  Eye Exam No Retinopathy - -  Foot Form Completion - Done Done   Veey well controlled and manged by endo

## 2018-10-31 NOTE — Assessment & Plan Note (Signed)
recetn start on pD, with possible inadequate dialysis, with leg edema and increased SOB, pt to contact dialysis center for reassessment of settings

## 2018-10-31 NOTE — Assessment & Plan Note (Signed)
Uncontrolled, increase clonidine  Dose DASH diet and commitment to daily physical activity for a minimum of 30 minutes discussed and encouraged, as a part of hypertension management. The importance of attaining a healthy weight is also discussed.  BP/Weight 10/27/2018 10/27/2018 10/04/2018 09/20/2018 09/12/2018 09/03/2018 8/88/2800  Systolic BP 349 179 150 569 794 801 655  Diastolic BP 70 69 70 64 72 89 62  Wt. (Lbs) 222 222 213 210 198.41 187 -  BMI 38.11 38.11 36.56 36.05 34.06 32.1 -

## 2018-11-01 ENCOUNTER — Other Ambulatory Visit (HOSPITAL_COMMUNITY)
Admission: RE | Admit: 2018-11-01 | Discharge: 2018-11-01 | Disposition: A | Discharge status: Home / Self Care | Payer: 59 | Source: Ambulatory Visit | Attending: Neurology | Admitting: Neurology

## 2018-11-01 ENCOUNTER — Other Ambulatory Visit: Payer: Self-pay

## 2018-11-01 DIAGNOSIS — Z01812 Encounter for preprocedural laboratory examination: Secondary | ICD-10-CM | POA: Insufficient documentation

## 2018-11-01 DIAGNOSIS — Z20828 Contact with and (suspected) exposure to other viral communicable diseases: Secondary | ICD-10-CM | POA: Diagnosis not present

## 2018-11-01 LAB — SARS CORONAVIRUS 2 (TAT 6-24 HRS): SARS Coronavirus 2: NEGATIVE

## 2018-11-04 ENCOUNTER — Other Ambulatory Visit: Payer: Self-pay

## 2018-11-04 ENCOUNTER — Ambulatory Visit: Payer: 59 | Attending: Neurology | Admitting: Neurology

## 2018-11-04 DIAGNOSIS — G4733 Obstructive sleep apnea (adult) (pediatric): Secondary | ICD-10-CM

## 2018-11-10 NOTE — Procedures (Signed)
[place] A. Merlene Laughter, MD     [email or web address]             NOCTURNAL POLYSOMNOGRAPHY   LOCATION: [facility]    Patient Name: Kathryn Beck, Kathryn Beck Date: 11/04/2018 Gender: Female D.O.B: 1956/02/27 Age (years): 69 Referring Provider: Phillips Odor MD, ABSM Height (inches): 64 Interpreting Physician: Phillips Odor MD, ABSM Weight (lbs): 222 RPSGT: Peak, Robert BMI: 38 MRN: [id number] Neck Size: 16.00 CLINICAL INFORMATION Sleep Study Type: NPSG     Indication for sleep study: N/A     Epworth Sleepiness Score:     SLEEP STUDY TECHNIQUE As per the AASM Manual for the Scoring of Sleep and Associated Events v2.3 (April 2016) with a hypopnea requiring 4% desaturations.  The channels recorded and monitored were frontal, central and occipital EEG, electrooculogram (EOG), submentalis EMG (chin), nasal and oral airflow, thoracic and abdominal wall motion, anterior tibialis EMG, snore microphone, electrocardiogram, and pulse oximetry.  MEDICATIONS Medications self-administered by patient taken the night of the study : N/A  Current Outpatient Medications:  .  acetaminophen (TYLENOL) 500 MG tablet, Take 500 mg by mouth every 8 (eight) hours as needed for mild pain. , Disp: , Rfl:  .  allopurinol (ZYLOPRIM) 100 MG tablet, Take 1 tablet (100 mg total) by mouth every other day. Please take 100 mg by mouth on Sunday, Wednesday, and Fridays (Patient taking differently: Take 100 mg by mouth 2 (two) times daily. ), Disp: 30 tablet, Rfl: 6 .  amLODipine (NORVASC) 10 MG tablet, TAKE 1 TABLET BY MOUTH ONCE DAILY., Disp: 28 tablet, Rfl: 10 .  cloNIDine (CATAPRES) 0.3 MG tablet, Take 1 tablet at 6 AM take 1/2 tablet at 2 PM and take 1 tablet at 10 PM every day, Disp: 75 tablet, Rfl: 5 .  dexlansoprazole (DEXILANT) 60 MG capsule, TAKE 1 CAPSULE IN THE MORNING WITH FOOD (Patient taking differently: Take 60 mg by mouth daily with breakfast. ), Disp: 28 capsule, Rfl: 10 .   ezetimibe (ZETIA) 10 MG tablet, TAKE 1 TABLET BY MOUTH ONCE DAILY., Disp: 28 tablet, Rfl: 0 .  gabapentin (NEURONTIN) 300 MG capsule, TAKE (1) CAPSULE BY MOUTH THREE TIMES DAILY, Disp: 84 capsule, Rfl: 4 .  GNP ASPIRIN LOW DOSE 81 MG EC tablet, TAKE 1 TABLET BY MOUTH ONCE DAILY. (Patient taking differently: Take 81 mg by mouth daily. ), Disp: 28 tablet, Rfl: 10 .  HYDROcodone-acetaminophen (NORCO/VICODIN) 5-325 MG tablet, Take 1 tablet by mouth daily as needed for moderate pain., Disp: , Rfl:  .  Insulin Degludec (TRESIBA FLEXTOUCH) 200 UNIT/ML SOPN, Inject 40 Units into the skin daily., Disp: , Rfl:  .  Insulin Lispro (HUMALOG KWIKPEN) 200 UNIT/ML SOPN, Inject 18-25 Units into the skin 3 (three) times daily before meals., Disp: 30 mL, Rfl: 5 .  isosorbide mononitrate (IMDUR) 30 MG 24 hr tablet, TAKE 1 TABLET BY MOUTH ONCE DAILY., Disp: 28 tablet, Rfl: 0 .  nystatin (MYCOSTATIN) 100000 UNIT/ML suspension, Take 5 mLs (500,000 Units total) by mouth 4 (four) times daily., Disp: 60 mL, Rfl: 0 .  olmesartan (BENICAR) 40 MG tablet, TAKE 1 TABLET BY MOUTH ONCE A DAY., Disp: 30 tablet, Rfl: 0 .  ondansetron (ZOFRAN) 4 MG tablet, Take 1 tablet (4 mg total) by mouth every 8 (eight) hours as needed for nausea or vomiting., Disp: 30 tablet, Rfl: 1 .  sertraline (ZOLOFT) 50 MG tablet, Take 50 mg by mouth daily., Disp: , Rfl:  .  ULTICARE MINI PEN NEEDLES 31G X 6 MM  MISC, USE AS DIRECTED, Disp: 100 each, Rfl: 10     SLEEP ARCHITECTURE The study was initiated at 9:39:21 PM and ended at 4:25:09 AM.  Sleep onset time was 16.2 minutes and the sleep efficiency was 72.3%%. The total sleep time was 293.5 minutes.  Stage REM latency was N/A minutes.  The patient spent 14.8%% of the night in stage N1 sleep, 85.0%% in stage N2 sleep, 0.2%% in stage N3 and 0% in REM.  Alpha intrusion was absent.  Supine sleep was 0.00%.  RESPIRATORY PARAMETERS The overall apnea/hypopnea index (AHI) was 72.0 per hour. There were  10 total apneas, including 6 obstructive, 4 central and 0 mixed apneas. There were 342 hypopneas and 6 RERAs.  The AHI during Stage REM sleep was N/A per hour.  AHI while supine was N/A per hour.  The mean oxygen saturation was 92.6%. The minimum SpO2 during sleep was 83.0%.  loud snoring was noted during this study.  CARDIAC DATA The 2 lead EKG demonstrated sinus rhythm. The mean heart rate was 86.1 beats per minute. Other EKG findings include: None.   LEG MOVEMENT DATA The total PLMS were 0 with a resulting PLMS index of 0.0. Associated arousal with leg movement index was 0.0.  IMPRESSIONS 1.  This recording shows severe obstructive sleep apnea syndrome.  AutoPap 8-15 is recommended.     Delano Metz, MD Diplomate, American Board of Sleep Medicine.  ELECTRONICALLY SIGNED ON:  11/10/2018, 4:17 PM [place] PH: [phone]   FX: [phone] [place]

## 2018-11-21 ENCOUNTER — Other Ambulatory Visit: Payer: Self-pay

## 2018-11-21 MED ORDER — HUMALOG KWIKPEN 200 UNIT/ML ~~LOC~~ SOPN: 18.0000 [IU] | PEN_INJECTOR | Freq: Three times a day (TID) | SUBCUTANEOUS | 5 refills | Status: DC

## 2018-11-22 ENCOUNTER — Ambulatory Visit: Payer: 59 | Admitting: Family Medicine

## 2018-11-22 DIAGNOSIS — N186 End stage renal disease: Secondary | ICD-10-CM | POA: Insufficient documentation

## 2018-11-22 DIAGNOSIS — Z992 Dependence on renal dialysis: Secondary | ICD-10-CM | POA: Insufficient documentation

## 2018-11-29 ENCOUNTER — Telehealth: Payer: Self-pay | Admitting: *Deleted

## 2018-11-29 NOTE — Telephone Encounter (Signed)
[name] called about Kathryn Beck. Said her daughter would be dropping off a letter and wanted to see if Dr. Moshe Cipro would review it and sign it as they are trying to get assistance for her dialysis and also she is on the kidney transplant list and this letter will help them with that. Said she would drop off sometime this week.

## 2018-11-29 NOTE — Telephone Encounter (Signed)
noted 

## 2018-11-30 ENCOUNTER — Telehealth: Payer: Self-pay | Admitting: Family Medicine

## 2018-11-30 NOTE — Telephone Encounter (Signed)
Attempt made to contact pt for clarification on the note handed in by family member or friend to front desk staff today, for clarification as to what the patient wants to be done with the info. Message k left for hwr to call back in am and speak with front desk please ( paper is in [place] area, as she handed this to me) In short, paper describes her debility and inability to care for herself and states that her spouse cares for her . It also names her known medical conditions.9 that she is aware of0 Please clarify when she calls back and send me the message. A copy of her note may be scanned into her record also please. Thank you

## 2018-12-05 ENCOUNTER — Ambulatory Visit (INDEPENDENT_AMBULATORY_CARE_PROVIDER_SITE_OTHER): Payer: 59 | Admitting: Family Medicine

## 2018-12-05 ENCOUNTER — Encounter (INDEPENDENT_AMBULATORY_CARE_PROVIDER_SITE_OTHER): Payer: Self-pay

## 2018-12-05 ENCOUNTER — Encounter: Payer: Self-pay | Admitting: Family Medicine

## 2018-12-05 ENCOUNTER — Emergency Department (HOSPITAL_COMMUNITY): Payer: 59

## 2018-12-05 ENCOUNTER — Other Ambulatory Visit: Payer: Self-pay

## 2018-12-05 ENCOUNTER — Encounter (HOSPITAL_COMMUNITY): Payer: Self-pay | Admitting: *Deleted

## 2018-12-05 ENCOUNTER — Emergency Department (HOSPITAL_COMMUNITY)
Admission: EM | Admit: 2018-12-05 | Discharge: 2018-12-05 | Disposition: A | Discharge status: Home / Self Care | Payer: 59 | Attending: Emergency Medicine | Admitting: Emergency Medicine

## 2018-12-05 VITALS — BP 88/70 | HR 104 | Temp 97.9°F | Resp 15 | Ht 64.0 in | Wt 191.0 lb

## 2018-12-05 DIAGNOSIS — E876 Hypokalemia: Secondary | ICD-10-CM

## 2018-12-05 DIAGNOSIS — Z794 Long term (current) use of insulin: Secondary | ICD-10-CM | POA: Diagnosis not present

## 2018-12-05 DIAGNOSIS — N186 End stage renal disease: Secondary | ICD-10-CM | POA: Insufficient documentation

## 2018-12-05 DIAGNOSIS — I132 Hypertensive heart and chronic kidney disease with heart failure and with stage 5 chronic kidney disease, or end stage renal disease: Secondary | ICD-10-CM | POA: Diagnosis not present

## 2018-12-05 DIAGNOSIS — M5410 Radiculopathy, site unspecified: Secondary | ICD-10-CM

## 2018-12-05 DIAGNOSIS — Z992 Dependence on renal dialysis: Secondary | ICD-10-CM | POA: Diagnosis not present

## 2018-12-05 DIAGNOSIS — I959 Hypotension, unspecified: Secondary | ICD-10-CM | POA: Diagnosis not present

## 2018-12-05 DIAGNOSIS — R531 Weakness: Secondary | ICD-10-CM | POA: Diagnosis not present

## 2018-12-05 DIAGNOSIS — E1122 Type 2 diabetes mellitus with diabetic chronic kidney disease: Secondary | ICD-10-CM | POA: Insufficient documentation

## 2018-12-05 DIAGNOSIS — Z79899 Other long term (current) drug therapy: Secondary | ICD-10-CM | POA: Diagnosis not present

## 2018-12-05 DIAGNOSIS — I509 Heart failure, unspecified: Secondary | ICD-10-CM | POA: Insufficient documentation

## 2018-12-05 DIAGNOSIS — R55 Syncope and collapse: Secondary | ICD-10-CM | POA: Insufficient documentation

## 2018-12-05 DIAGNOSIS — R42 Dizziness and giddiness: Secondary | ICD-10-CM | POA: Diagnosis present

## 2018-12-05 LAB — CBC WITH DIFFERENTIAL/PLATELET
Abs Immature Granulocytes: 0.07 10*3/uL (ref 0.00–0.07)
Basophils Absolute: 0.1 10*3/uL (ref 0.0–0.1)
Basophils Relative: 1 %
Eosinophils Absolute: 0.4 10*3/uL (ref 0.0–0.5)
Eosinophils Relative: 5 %
HCT: 30.5 % — ABNORMAL LOW (ref 36.0–46.0)
Hemoglobin: 8.9 g/dL — ABNORMAL LOW (ref 12.0–15.0)
Immature Granulocytes: 1 %
Lymphocytes Relative: 26 %
Lymphs Abs: 2.4 10*3/uL (ref 0.7–4.0)
MCH: 23.1 pg — ABNORMAL LOW (ref 26.0–34.0)
MCHC: 29.2 g/dL — ABNORMAL LOW (ref 30.0–36.0)
MCV: 79.2 fL — ABNORMAL LOW (ref 80.0–100.0)
Monocytes Absolute: 0.8 10*3/uL (ref 0.1–1.0)
Monocytes Relative: 9 %
Neutro Abs: 5.5 10*3/uL (ref 1.7–7.7)
Neutrophils Relative %: 58 %
Platelets: 386 10*3/uL (ref 150–400)
RBC: 3.85 MIL/uL — ABNORMAL LOW (ref 3.87–5.11)
RDW: 19.9 % — ABNORMAL HIGH (ref 11.5–15.5)
WBC: 9.3 10*3/uL (ref 4.0–10.5)
nRBC: 0 % (ref 0.0–0.2)

## 2018-12-05 LAB — BASIC METABOLIC PANEL
Anion gap: 13 (ref 5–15)
BUN: 19 mg/dL (ref 8–23)
CO2: 24 mmol/L (ref 22–32)
Calcium: 8.9 mg/dL (ref 8.9–10.3)
Chloride: 100 mmol/L (ref 98–111)
Creatinine, Ser: 3.93 mg/dL — ABNORMAL HIGH (ref 0.44–1.00)
GFR calc Af Amer: 13 mL/min — ABNORMAL LOW (ref >60)
GFR calc non Af Amer: 12 mL/min — ABNORMAL LOW (ref >60)
Glucose, Bld: 124 mg/dL — ABNORMAL HIGH (ref 70–99)
Potassium: 3.1 mmol/L — ABNORMAL LOW (ref 3.5–5.1)
Sodium: 137 mmol/L (ref 135–145)

## 2018-12-05 LAB — MAGNESIUM: Magnesium: 1.9 mg/dL (ref 1.7–2.4)

## 2018-12-05 LAB — TROPONIN I (HIGH SENSITIVITY)
Troponin I (High Sensitivity): 11 ng/L (ref <18)
Troponin I (High Sensitivity): 11 ng/L (ref <18)

## 2018-12-05 LAB — BRAIN NATRIURETIC PEPTIDE: B Natriuretic Peptide: 81 pg/mL (ref 0.0–100.0)

## 2018-12-05 LAB — CBG MONITORING, ED: Glucose-Capillary: 153 mg/dL — ABNORMAL HIGH (ref 70–99)

## 2018-12-05 MED ORDER — POTASSIUM CHLORIDE CRYS ER 20 MEQ PO TBCR
40.0000 meq | EXTENDED_RELEASE_TABLET | Freq: Once | ORAL | Status: AC
  Administered 2018-12-05: 40 meq via ORAL
  Filled 2018-12-05: qty 2

## 2018-12-05 MED ORDER — ONDANSETRON HCL 4 MG PO TABS
4.0000 mg | ORAL_TABLET | Freq: Once | ORAL | Status: AC
  Administered 2018-12-05: 4 mg via ORAL
  Filled 2018-12-05: qty 1

## 2018-12-05 NOTE — Discharge Instructions (Addendum)
Your blood pressure has improved significantly after fluids.  Your chemistries show your potassium to be low at 3.1.  You have been given oral potassium.  Please have your primary physician or your kidney doctor to monitor your potassium closely.  Please return to the emergency department if you continue to have sensation of passing out, you have any worsening of your symptoms, any changes in your condition, problems, or concerns.

## 2018-12-05 NOTE — ED Triage Notes (Signed)
Patient arrived to unit by [facility] for hypotension.  Patient had just completed all "but 20 minutes" of dialysis treatement, patient went to Dr. Moshe Cipro office for another scheduled appointment.  While patient was being seen, patient became weak.  EMS called by office.

## 2018-12-05 NOTE — Progress Notes (Signed)
   Kathryn Beck     MRN: [id number]      DOB: [birth year]   HPI Kathryn Beck is here for follow up and re-evaluation of chronic medical conditions, States she was in fair condition up to this morning prior to hemodialysis. Generally on peritoneal dialysis , but having shunt problems and has planned surgery. States developed cramps during dialysis earlier today which  Was taken off dialysis, put back on again, and overall was dialyzed for probably 20 to 30 minutes shorter than normal. On arrival , she was noted to be hypotensive and mildly tachycardic and slightly diaphoretic I went in to evaluate the patient , and for approximately 3 to 5 minutes , it was difficult to communicate with her and get  Response , and she developed a glazed look in her eyes. She at no time had localized weakness, no jerking noted, but minimal responsiveness occurred. Blood sugar was placed in her mouth , which seemed to be beneficial, blood sugar tested was 164 EMS was called and pt transported to the ED, alert , however still cx/o extreme weakness and less alert than normal I specifically asked about her need for ongoing pain medication for her arthritic pain , and she states not relying on this anymore, so this will be discontinued. ROS: See HPI     PE  BP (!) 88/70   Pulse (!) 104   Temp 97.9 F (36.6 C) (Temporal)   Resp 15   Ht 5\' 4"  (1.626 m)   Wt 191 lb (86.6 kg)   SpO2 98%   BMI 32.79 kg/m   Patient barely responsive for approx 3 minutes, sternal rub and continually speaking with pt to arouse her. At one time eye became glazed and rolled backward for approximately 2 minutes . She maintained a pulse and regular breathing throughout.  HEENT: No facial asymmetry,  EOMI  Chest: Clear to auscultation bilaterally.  CVS: S1, S2 no murmurs, no S3.Regular rate.     Ext: No edema   Assessment & Plan  Hypotension Symptomatic with decreased responsiveness following hemodialysis earlier today  which was shortened due to pt c/o cramps, and unsure what bP was at the center. EMS called to transport pt to ED due to her near syncopal and brief unresponsive episode  Back pain with left-sided radiculopathy Hydrocodone and tramdol discontinued , reports infrequent use

## 2018-12-05 NOTE — Assessment & Plan Note (Signed)
Hydrocodone and tramdol discontinued , reports infrequent use

## 2018-12-05 NOTE — Assessment & Plan Note (Signed)
Symptomatic with decreased responsiveness following hemodialysis earlier today which was shortened due to pt c/o cramps, and unsure what bP was at the center. EMS called to transport pt to ED due to her near syncopal and brief unresponsive episode

## 2018-12-05 NOTE — ED Provider Notes (Signed)
[facility] EMERGENCY DEPARTMENT Provider Note   CSN: [id number] Arrival date & time: 12/05/18  1631     History   Chief Complaint Chief Complaint  Patient presents with  . Hypotension    HPI Kathryn Beck is a 63 y.o. female.     Patient is a 63 year old female who presents to the emergency department by EMS because of hypotension.  The patient was in dialysis today, came to a doctor's appointment when she had an episode in which she says she "nearly passed out".  Patient states after dialysis she was not feeling well.  While in the doctor's office she started feeling hot and then she started feeling as though she was going to pass out.  EMS reports blood pressure was 88 systolic.  It is of note that the patient was previously doing peritoneal dialysis at home, she had complications with her access, and had to go to her regular dialysis for assistance today.  The patient says she is not sure of any recent fever issues.  She says she has had some episodes where she felt hot.  Last week she says she had blisters in her mouth, and most of those have cleared now.  Patient denies any open wounds.  She has not had any excessive shortness of breath.  She is not having any excessive pain.  No unusual headache.  No weakness of the upper or lower extremities.  Patient presents now for evaluation following this hypotension event.  The history is provided by the patient.    Past Medical History:  Diagnosis Date  . Acid reflux   . Anemia   . Anxiety    clausterophoic  . Arthritis   . Axillary masses    Soft tissue - status post excision  . Back pain   . CHF (congestive heart failure) ([place])   . Depression   . Dialysis patient ([place])   . Diastolic heart failure ([place])   . End-stage renal disease ([place])    TTHSat  in [place]  . Essential hypertension   . History of blood transfusion   . Ischemic heart disease    Abnormal Myoview April 2018 - medical therapy  . Mixed hyperlipidemia    . Obesity   . Pancreatitis   . Peritoneal dialysis catheter in place [facility])   . Pneumonia 2019   two times in 2019  . Sleep apnea    Noncompliant with CPAP  . Type 2 diabetes mellitus, uncontrolled ([place])    Type II    Patient Active Problem List   Diagnosis Date Noted  . Hypotension 12/05/2018  . Weakness 12/05/2018  . Near syncope 12/05/2018  . Peritoneal dialysis status ([place]) 10/31/2018  . Ischemic heart disease 09/25/2018  . Renal disease 08/22/2018  . Not currently working due to disabled status 06/13/2018  . ESRD on dialysis ([place]) 06/12/2018  . Hypertension associated with diabetes ([place]) 06/12/2018  . Depression, major, single episode, severe ([place]) 03/09/2018  . Adrenal mass, left ([place]) 11/09/2017  . MGUS (monoclonal gammopathy of unknown significance) 11/05/2017  . Chronic diastolic CHF (congestive heart failure) ([place]) 09/20/2017  . Bilateral leg weakness 09/09/2017  . Adrenal adenoma 09/03/2017  . 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 Unsteady gait 07/11/2017  . Recurrent falls 07/11/2017  . Anemia of chronic disease 03/24/2017  . History of colonic polyps 02/10/2017  . Dysphagia 11/05/2016  . IBS (irritable bowel syndrome) 02/04/2016  . Heat sensitivity 10/07/2014  . Cardiac murmur  01/17/2014  . Back pain with left-sided radiculopathy 09/18/2013  . Seasonal allergies 03/10/2013  . Lipoma of back 12/22/2012  . GERD (gastroesophageal reflux disease) 08/22/2012  . Sleep apnea 06/02/2011  . CKD (chronic kidney disease) stage 4, GFR 15-29 ml/min (HCC) 01/13/2011  . Neuropathy 04/16/2010  . FATIGUE 07/24/2009  . LIVER FUNCTION TESTS, ABNORMAL, HX OF 05/08/2009  . LUPUS ERYTHEMATOSUS, DISCOID 06/05/2008  . Arm pain 05/30/2008  . Type 2 diabetes mellitus with ESRD (end-stage renal disease) ([place]) 06/07/2007  . Mixed hyperlipidemia 06/07/2007  . Morbid obesity ([place]) 06/07/2007  . Malignant hypertension 06/07/2007    Past  Surgical History:  Procedure Laterality Date  . ABDOMINAL HYSTERECTOMY    . AV FISTULA PLACEMENT Left 09/02/2017   Procedure: creation of left arm ARTERIOVENOUS (AV) FISTULA;  Surgeon: Serafina Mitchell, MD;  Location: [facility] OR;  Service: Vascular;  Laterality: Left;  . COLONOSCOPY  2008   Dr. Oneida Alar: normal   . COLONOSCOPY N/A 12/18/2016   Dr. Oneida Alar: multiple tubular adenomas, internal hemorrhoids. Surveillance in 3 years   . ESOPHAGEAL DILATION N/A 10/13/2015   Procedure: ESOPHAGEAL DILATION;  Surgeon: Rogene Houston, MD;  Location[facility] ENDO SU[facility];  Service: Endoscopy;  Laterality: N/A;  . ESOPHAGOGASTRODUODENOSCOPY N/A 10/13/2015   Dr. Laural Golden: chronic gastritis on path, no H.pylori. Empiric dilation   . ESOPHAGOGASTRODUODENOSCOPY N/A 12/18/2016   Dr. Oneida Alar: mild gastritis. BRAVO study revealed uncontrolled GERD. Dysphagia secondary to uncontrolled reflux  . FOOT SURGERY Bilateral    "nerve"    . LUNG BIOPSY    . MASS EXCISION Right 01/09/2013   Procedure: EXCISION OF NEOPLASM OF RIGHT  AXILLA  AND EXCISION OF NEOPLASM OF LEFT AXILLA;  Surgeon: Jamesetta So, MD;  Location: [facility] ORS;  Service: General;  Laterality: Right;  procedure end @ 08:23  . MYRINGOTOMY WITH TUBE PLACEMENT Bilateral 04/28/2017   Procedure: BILATERAL MYRINGOTOMY WITH TUBE PLACEMENT;  Surgeon: Leta Baptist, MD;  Location: [place];  Service: ENT;  Laterality: Bilateral;  . REVISION OF ARTERIOVENOUS GORETEX GRAFT Left 05/04/2018   Procedure: TRANSPOSITION OF CEPHALIC VEIN ARTERIOVENOUS FISTULA LEFT ARM;  Surgeon: Rosetta Posner, MD;  Location: [place];  Service: Vascular;  Laterality: Left;  . SAVORY DILATION N/A 12/18/2016   Procedure: SAVORY DILATION;  Surgeon: Danie Binder, MD;  Location[facility] END[facility];  Service: Endoscopy;  Laterality: N/A;     OB History   No obstetric history on file.      Home Medications    Prior to Admission medications   Medication Sig Start Date End Date Taking? Authorizing Provider  acetaminophen  (TYLENOL) 500 MG tablet Take 500 mg by mouth every 8 (eight) hours as needed for mild pain.     [provider]  amLODipine (NORVASC) 10 MG tablet TAKE 1 TABLET BY MOUTH ONCE DAILY. 08/26/18   Fayrene Helper, MD  cloNIDine (CATAPRES) 0.3 MG tablet Take 1 tablet at 6 AM take 1/2 tablet at 2 PM and take 1 tablet at 10 PM every day 10/27/18   Fayrene Helper, MD  dexlansoprazole (DEXILANT) 60 MG capsule TAKE 1 CAPSULE IN THE MORNING WITH FOOD Patient taking differently: Take 60 mg by mouth daily with breakfast.  04/07/18   Fayrene Helper, MD  ezetimibe (ZETIA) 10 MG tablet TAKE 1 TABLET BY MOUTH ONCE DAILY. 10/24/18   Fayrene Helper, MD  gabapentin (NEURONTIN) 300 MG capsule TAKE (1) CAPSULE BY MOUTH THREE TIMES DAILY 08/01/18   Fayrene Helper, MD  GNP ASPIRIN LOW DOSE 81 MG EC tablet TAKE 1 TABLET BY MOUTH ONCE DAILY. Patient taking differently: Take 81 mg by mouth daily.  06/09/18   Satira Sark, MD  Insulin Degludec (TRESIBA FLEXTOUCH) 200 UNIT/ML SOPN Inject 40 Units into the skin daily.    [provider]  Insulin Lispro (HUMALOG KWIKPEN) 200 UNIT/ML SOPN Inject 18-25 Units into the skin 3 (three) times daily before meals. 11/21/18   [name], MD  isosorbide mononitrate (IMDUR) 30 MG 24 hr tablet TAKE 1 TABLET BY MOUTH ONCE DAILY. 10/24/18   Fayrene Helper, MD  nystatin (MYCOSTATIN) 100000 UNIT/ML suspension Take 5 mLs (500,000 Units total) by mouth 4 (four) times daily. 09/02/18   Perlie Mayo, NP  ondansetron (ZOFRAN) 4 MG tablet Take 1 tablet (4 mg total) by mouth every 8 (eight) hours as needed for nausea or vomiting. 05/27/18   Fayrene Helper, MD  sertraline (ZOLOFT) 50 MG tablet Take 50 mg by mouth daily.    [provider]  sevelamer (RENAGEL) 800 MG tablet Take 800 mg by mouth 3 (three) times daily with meals.    [provider]  ULTICARE MINI PEN NEEDLES 31G X 6 MM MISC USE AS DIRECTED 04/07/18   Fayrene Helper,  MD  FLUoxetine (PROZAC) 10 MG capsule Take 10 mg by mouth daily.    05/28/11  [provider]  glipiZIDE (GLUCOTROL) 10 MG tablet Take 10 mg by mouth 2 (two) times daily before a meal.    05/28/11  [provider]    Family History Family History  Problem Relation Age of Onset  . Hypertension Father   . Hypercholesterolemia Father   . Arthritis Father   . Hypertension Sister   . Hypercholesterolemia Sister   . Breast cancer Sister   . Hypertension Sister   . Colon cancer Neg Hx   . Colon polyps Neg Hx     Social History Social History   Tobacco Use  . Smoking status: Never Smoker  . Smokeless tobacco: Never Used  Substance Use Topics  . Alcohol use: No  . Drug use: No     Allergies   Ace inhibitors, Penicillins, Statins, and Albuterol   Review of Systems Review of Systems  Constitutional: Positive for diaphoresis. Negative for activity change, appetite change and fever.  HENT: Negative for congestion, ear discharge, ear pain, facial swelling, nosebleeds, rhinorrhea, sneezing and tinnitus.   Eyes: Negative for photophobia, pain and discharge.  Respiratory: Negative for cough, choking, shortness of breath and wheezing.   Cardiovascular: Negative for chest pain, palpitations and leg swelling.  Gastrointestinal: Negative for abdominal pain, blood in stool, constipation, diarrhea, nausea and vomiting.  Genitourinary: Negative for difficulty urinating, dysuria, flank pain, frequency and hematuria.  Musculoskeletal: Negative for back pain, gait problem, myalgias and neck pain.  Skin: Negative for color change, rash and wound.  Neurological: Positive for weakness and light-headedness. Negative for dizziness, seizures, syncope, facial asymmetry, speech difficulty and numbness.  Hematological: Negative for adenopathy. Does not bruise/bleed easily.  Psychiatric/Behavioral: Negative for agitation, confusion, hallucinations, self-injury and suicidal ideas. The patient  is not nervous/anxious.      Physical Exam Updated Vital Signs BP (!) 104/58 (BP Location: Right Arm)   Pulse 83   Temp 97.9 F (36.6 C) (Oral)   Resp 16   Ht 5\' 4"  (1.626 m)   Wt 86.6 kg   SpO2 96%   BMI 32.77 kg/m   Physical Exam Vitals signs and nursing  note reviewed.  Constitutional:      Appearance: She is well-developed. She is not toxic-appearing.  HENT:     Head: Normocephalic.     Right Ear: Tympanic membrane and external ear normal.     Left Ear: Tympanic membrane and external ear normal.  Eyes:     General: Lids are normal.     Pupils: Pupils are equal, round, and reactive to light.  Neck:     Musculoskeletal: Normal range of motion and neck supple.     Vascular: No carotid bruit.  Cardiovascular:     Rate and Rhythm: Normal rate and regular rhythm.     Pulses: Normal pulses.     Heart sounds: Normal heart sounds.  Pulmonary:     Effort: No respiratory distress.     Breath sounds: Normal breath sounds.  Abdominal:     General: Bowel sounds are normal.     Palpations: Abdomen is soft.     Tenderness: There is no abdominal tenderness. There is no guarding.  Musculoskeletal: Normal range of motion.     Comments: Patient has a dialysis catheter in the right arm.  It has a good thrill.  Lymphadenopathy:     Head:     Right side of head: No submandibular adenopathy.     Left side of head: No submandibular adenopathy.     Cervical: No cervical adenopathy.  Skin:    General: Skin is warm and dry.  Neurological:     Mental Status: She is alert and oriented to person, place, and time.     Cranial Nerves: No cranial nerve deficit.     Sensory: No sensory deficit.     Coordination: Coordination normal.  Psychiatric:        Speech: Speech normal.      ED Treatments / Results  Labs (all labs ordered are listed, but only abnormal results are displayed) Labs Reviewed  CBG MONITORING, ED - Abnormal; Notable for the following components:      Result Value    Glucose-Capillary 153 (*)    All other components within normal limits    EKG None  Radiology No results found.  Procedures Procedures (including critical care time)  Medications Ordered in ED Medications - No data to display   Initial Impression / Assessment and Plan / ED Course  I have reviewed the triage vital signs and the nursing notes.  Pertinent labs & imaging results that were available during my care of the patient were reviewed by me and considered in my medical decision making (see chart for details).          Final Clinical Impressions(s) / ED Diagnoses MDM  CBG was 153.  Patient was seen in the emergency department because of hypotension at her doctor's office.  The patient had dialysis earlier today, and states she was not feeling well after the dialysis.  The patient's systolic pressure within the 80s at the doctor's office.  Patient received IV fluid by EMS and is feeling some better now.  Complete blood count shows the patient to have some anemia present with a hemoglobin of 8.9, hematocrit of 30.5.  The remainder of the complete blood count is nonacute.  This is the patient's usual range.  The chest x-ray shows no acute cardiopulmonary disease.  There is chronic blunting of the left costophrenic angle favoring pleural-parenchymal scarring.  The B natruretic peptide is normal at 81, troponin is normal at 11.  Magnesium is normal at 1.9.  The potassium is low at 3.1, oral potassium has been given.  Recheck.  Patient's blood pressure is now up to 974 systolic.  Patient states she feels better.  I have asked the patient to resume her renal diet, and her intake instructions from her nephrology specialist.  I have asked her to return to the emergency department if any changes in her condition, if she continues to feel as though she is passing out, or any changes in her symptoms, problems or concerns.  Patient and husband are in agreement with this plan.   Final  diagnoses:  Hypotension, unspecified hypotension type  Hypokalemia  End stage renal disease [facility])    ED Discharge Orders    None       Lily Kocher, PA-C 12/07/18 1058    Fredia Sorrow, MD 12/12/18 [phone]

## 2018-12-05 NOTE — Patient Instructions (Signed)
You are sent to the ED via ambulance because of yoiur very low blood pressure and near syncope.   I have spoke directly with the ED physician who is expecting you.  F/U in 2 to 3 weeks, call if you need me sooner

## 2018-12-08 DIAGNOSIS — Z992 Dependence on renal dialysis: Secondary | ICD-10-CM | POA: Insufficient documentation

## 2018-12-08 DIAGNOSIS — N186 End stage renal disease: Secondary | ICD-10-CM | POA: Insufficient documentation

## 2018-12-15 ENCOUNTER — Other Ambulatory Visit: Payer: Self-pay

## 2018-12-15 DIAGNOSIS — Z20822 Contact with and (suspected) exposure to covid-19: Secondary | ICD-10-CM

## 2018-12-15 NOTE — Addendum Note (Signed)
Addended by: Matilde Sprang on: 12/15/2018 01:36 PM   Modules accepted: Orders

## 2018-12-16 ENCOUNTER — Other Ambulatory Visit: Payer: Self-pay | Admitting: Family Medicine

## 2018-12-16 DIAGNOSIS — M5410 Radiculopathy, site unspecified: Secondary | ICD-10-CM

## 2018-12-16 LAB — NOVEL CORONAVIRUS, NAA: SARS-CoV-2, NAA: NOT DETECTED

## 2018-12-19 ENCOUNTER — Telehealth: Payer: Self-pay | Admitting: *Deleted

## 2018-12-19 ENCOUNTER — Telehealth: Payer: Self-pay

## 2018-12-19 NOTE — Telephone Encounter (Signed)
Kathryn Beck with [facility] called had a question about pt allergies. She is allergic to penicillin. She has a procedure tomorrow and wanted to know if she could tolerate cephalosporins. He would like a call back at [phone]

## 2018-12-19 NOTE — Telephone Encounter (Signed)
Per pt. Request, due to scheduled surgery tomorrow, COVID result faxed to [facility], to  [phone].

## 2018-12-19 NOTE — Telephone Encounter (Signed)
Spoke with Joe the pharmacist and told him not to use cephalosporins because patients answers to the penicillin questions were unknown.

## 2018-12-22 ENCOUNTER — Telehealth: Payer: Self-pay | Admitting: Cardiovascular Disease

## 2018-12-22 NOTE — Telephone Encounter (Signed)
    COVID-19 Pre-Screening Questions:  . In the past 7 to 10 days have you had a cough,  shortness of breath, headache, congestion, fever (100 or greater) body aches, chills, sore throat, or sudden loss of taste or sense of smell? No . Have you been around anyone with known Covid 19. no . Have you been around anyone who is awaiting Covid 19 test results in the past 7 to 10 days? no . Have you been around anyone who has been exposed to Covid 19, or has mentioned symptoms of Covid 19 within the past 7 to 10 days?no  If you have any concerns/questions about symptoms patients report during screening (either on the phone or at threshold). Contact the provider seeing the patient or DOD for further guidance.  If neither are available contact a member of the leadership team.            

## 2018-12-23 ENCOUNTER — Ambulatory Visit (INDEPENDENT_AMBULATORY_CARE_PROVIDER_SITE_OTHER): Payer: 59 | Admitting: Cardiology

## 2018-12-23 ENCOUNTER — Other Ambulatory Visit: Payer: Self-pay

## 2018-12-23 ENCOUNTER — Encounter: Payer: Self-pay | Admitting: Cardiology

## 2018-12-23 ENCOUNTER — Other Ambulatory Visit: Payer: Self-pay | Admitting: Cardiology

## 2018-12-23 ENCOUNTER — Encounter: Payer: Self-pay | Admitting: *Deleted

## 2018-12-23 ENCOUNTER — Telehealth: Payer: Self-pay | Admitting: Cardiology

## 2018-12-23 VITALS — BP 142/50 | HR 101 | Ht 64.0 in | Wt 192.0 lb

## 2018-12-23 DIAGNOSIS — R011 Cardiac murmur, unspecified: Secondary | ICD-10-CM

## 2018-12-23 DIAGNOSIS — Z01810 Encounter for preprocedural cardiovascular examination: Secondary | ICD-10-CM

## 2018-12-23 DIAGNOSIS — Z01812 Encounter for preprocedural laboratory examination: Secondary | ICD-10-CM | POA: Diagnosis not present

## 2018-12-23 DIAGNOSIS — Z992 Dependence on renal dialysis: Secondary | ICD-10-CM

## 2018-12-23 DIAGNOSIS — N186 End stage renal disease: Secondary | ICD-10-CM

## 2018-12-23 DIAGNOSIS — E782 Mixed hyperlipidemia: Secondary | ICD-10-CM

## 2018-12-23 DIAGNOSIS — R9439 Abnormal result of other cardiovascular function study: Secondary | ICD-10-CM

## 2018-12-23 DIAGNOSIS — I10 Essential (primary) hypertension: Secondary | ICD-10-CM

## 2018-12-23 NOTE — Patient Instructions (Addendum)
Medication Instructions:  Continue all current medications.  Labwork: CBC, BMET - orders given today.   Testing/Procedures:  Your physician has requested that you have an echocardiogram. Echocardiography is a painless test that uses sound waves to create images of your heart. It provides your doctor with information about the size and shape of your heart and how well your heart's chambers and valves are working. This procedure takes approximately one hour. There are no restrictions for this procedure - can be done after the heart cath.   Office will contact with results via phone or letter.    Your physician has requested that you have a cardiac catheterization. Cardiac catheterization is used to diagnose and/or treat various heart conditions. Doctors may recommend this procedure for a number of different reasons. The most common reason is to evaluate chest pain. Chest pain can be a symptom of coronary artery disease (CAD), and cardiac catheterization can show whether plaque is narrowing or blocking your heart's arteries. This procedure is also used to evaluate the valves, as well as measure the blood flow and oxygen levels in different parts of your heart. For further information please visit [email or web address]. Please follow instruction sheet, as given.  Follow-Up: 1 month   Any Other Special Instructions Will Be Listed Below (If Applicable).  If you need a refill on your cardiac medications before your next appointment, please call your pharmacy.

## 2018-12-23 NOTE — Telephone Encounter (Signed)
°  Precert needed for: left heart cath - 10/8 - Kathryn Beck

## 2018-12-23 NOTE — H&P (View-Only) (Signed)
Cardiology Office Note  Date: 12/23/2018   ID: Kathryn Beck, Kathryn Beck Mar 21, 1956, MRN [id number]  PCP:  Fayrene Helper, MD  Cardiologist:  Rozann Lesches, MD Electrophysiologist:  None   Chief Complaint  Patient presents with  . Preoperative cardiac evaluation    History of Present Illness: Kathryn Beck is a 63 y.o. female last assessed via telehealth encounter by Ms. Barrett PA-C in July.  She presents today with a family member for follow-up.  Since I last saw her she did manifest progressive renal failure and has been on hemodialysis since earlier this year.  She was temporarily on a trial of peritoneal dialysis but had problems with her catheter and just had it revised.  Her hemodialysis sessions are now on Monday, Wednesday, and Friday.  She is undergoing renal transplant evaluation through [facility] and it has been requested that she undergo a follow-up echocardiogram as well as a stress echocardiogram, although I cannot tell based on review of the notes whether it was understood that she had already undergone prior noninvasive ischemic testing that was abnormal.  She underwent a Lexiscan Myoview in April 2018 that demonstrated evidence of anterior scar with moderate peri-infarct ischemia, overall intermediate risk study.  We did not pursue cardiac catheterization at that time in the setting of renal insufficiency that was not yet at the point of requiring hemodialysis.  She does not report chest pain with her dialysis sessions.  Current cardiac medications include aspirin, Toprol-XL, Imdur, and Norvasc.  Past Medical History:  Diagnosis Date  . Acid reflux   . Anemia   . Arthritis   . Axillary masses    Soft tissue - status post excision  . Back pain   . Depression   . Diastolic heart failure ([place])   . End-stage renal disease ([place])    TTHSat  in [place]  . Essential hypertension   . History of blood transfusion   . History of claustrophobia   . History of  pneumonia 2019  . Ischemic heart disease    Abnormal Myoview April 2018 - medical therapy  . Mixed hyperlipidemia   . Obesity   . Pancreatitis   . Peritoneal dialysis catheter in place [facility])   . Sleep apnea    Noncompliant with CPAP  . Type 2 diabetes mellitus ([place])     Past Surgical History:  Procedure Laterality Date  . ABDOMINAL HYSTERECTOMY    . AV FISTULA PLACEMENT Left 09/02/2017   Procedure: creation of left arm ARTERIOVENOUS (AV) FISTULA;  Surgeon: Serafina Mitchell, MD;  [facility];  Service: Vascular;  Laterality: Left;  . COLONOSCOPY  2008   Dr. Oneida Alar: normal   . COLONOSCOPY N/A 12/18/2016   Dr. Oneida Alar: multiple tubular adenomas, internal hemorrhoids. Surveillance in 3 years   . ESOPHAGEAL DILATION N/A 10/13/2015   Procedure: ESOPHAGEAL DILATION;  Surgeon: Rogene Houston, MD;  [facility];  Service: Endoscopy;  Laterality: N/A;  . ESOPHAGOGASTRODUODENOSCOPY N/A 10/13/2015   Dr. Laural Golden: chronic gastritis on path, no H.pylori. Empiric dilation   . ESOPHAGOGASTRODUODENOSCOPY N/A 12/18/2016   Dr. Oneida Alar: mild gastritis. BRAVO study revealed uncontrolled GERD. Dysphagia secondary to uncontrolled reflux  . FOOT SURGERY Bilateral    "nerve"    . LUNG BIOPSY    . MASS EXCISION Right 01/09/2013   Procedure: EXCISION OF NEOPLASM OF RIGHT  AXILLA  AND EXCISION OF NEOPLASM OF LEFT AXILLA;  Surgeon: Jamesetta So, MD;  Location: [facility] ORS;  Service: General;  Laterality: Right;  procedure end @ 08:23  . MYRINGOTOMY WITH TUBE PLACEMENT Bilateral 04/28/2017   Procedure: BILATERAL MYRINGOTOMY WITH TUBE PLACEMENT;  Surgeon: Leta Baptist, MD;  Location: [place];  Service: ENT;  Laterality: Bilateral;  . REVISION OF ARTERIOVENOUS GORETEX GRAFT Left 05/04/2018   Procedure: TRANSPOSITION OF CEPHALIC VEIN ARTERIOVENOUS FISTULA LEFT ARM;  Surgeon: Rosetta Posner, MD;  Location: [place];  Service: Vascular;  Laterality: Left;  . SAVORY DILATION N/A 12/18/2016   Procedure: SAVORY DILATION;   Surgeon: Danie Binder, MD;  [facility];  Service: Endoscopy;  Laterality: N/A;    Current Outpatient Medications  Medication Sig Dispense Refill  . acetaminophen (TYLENOL) 500 MG tablet Take 1,000 mg by mouth at bedtime.     Marland Kitchen amLODipine (NORVASC) 10 MG tablet TAKE 1 TABLET BY MOUTH ONCE DAILY. (Patient taking differently: Take 10 mg by mouth daily. ) 28 tablet 10  . cloNIDine (CATAPRES) 0.3 MG tablet Take 1 tablet at 6 AM take 1/2 tablet at 2 PM and take 1 tablet at 10 PM every day (Patient taking differently: Take 0.3 mg by mouth See admin instructions. Take 1 tablet at 6 AM take 1/2 tablet at 2 PM and take 1 tablet at 10 PM every day) 75 tablet 5  . dexlansoprazole (DEXILANT) 60 MG capsule TAKE 1 CAPSULE IN THE MORNING WITH FOOD (Patient taking differently: Take 60 mg by mouth daily with breakfast. ) 28 capsule 10  . ezetimibe (ZETIA) 10 MG tablet TAKE 1 TABLET BY MOUTH ONCE DAILY. 28 tablet 11  . furosemide (LASIX) 80 MG tablet Take 80 mg by mouth 2 (two) times daily.    Marland Kitchen gabapentin (NEURONTIN) 300 MG capsule TAKE (1) CAPSULE BY MOUTH THREE TIMES DAILY 84 capsule 3  . GNP ASPIRIN LOW DOSE 81 MG EC tablet TAKE 1 TABLET BY MOUTH ONCE DAILY. (Patient taking differently: Take 81 mg by mouth daily. ) 28 tablet 10  . Insulin Degludec (TRESIBA FLEXTOUCH) 200 UNIT/ML SOPN Inject 50 Units into the skin every morning.     . Insulin Lispro (HUMALOG KWIKPEN) 200 UNIT/ML SOPN Inject 18-25 Units into the skin 3 (three) times daily before meals. 30 mL 5  . isosorbide mononitrate (IMDUR) 30 MG 24 hr tablet TAKE 1 TABLET BY MOUTH ONCE DAILY. 28 tablet 11  . metoprolol succinate (TOPROL-XL) 50 MG 24 hr tablet Take 50 mg by mouth every morning.    . nystatin (MYCOSTATIN) 100000 UNIT/ML suspension Take 5 mLs (500,000 Units total) by mouth 4 (four) times daily. 60 mL 0  . ondansetron (ZOFRAN) 4 MG tablet Take 1 tablet (4 mg total) by mouth every 8 (eight) hours as needed for nausea or vomiting. 30  tablet 1  . sertraline (ZOLOFT) 50 MG tablet TAKE 1 TABLET BY MOUTH ONCE DAILY. 28 tablet 4  . [name] 500 MG chewable tablet Chew 500 mg by mouth See admin instructions. Take one tablet by mouth three times daily with meals and with take one tablet twice daily with snacks    . zolpidem (AMBIEN) 10 MG tablet Take 10 mg by mouth at bedtime.      No current facility-administered medications for this visit.    Allergies:  Ace inhibitors, Penicillins, Statins, and Albuterol   Social History: The patient  reports that she has never smoked. She has never used smokeless tobacco. She reports that she does not drink alcohol or use drugs.   Family History: The patient's family history includes Arthritis in her  father; Breast cancer in her sister; Hypercholesterolemia in her father and sister; Hypertension in her father, sister, and sister.   ROS:  Please see the history of present illness. Otherwise, complete review of systems is positive for none.  All other systems are reviewed and negative.   Physical Exam: VS:  BP (!) 142/50   Pulse (!) 101   Ht 5\' 4"  (1.626 m)   Wt 192 lb (87.1 kg)   SpO2 98%   BMI 32.96 kg/m , BMI Body mass index is 32.96 kg/m.  Wt Readings from Last 3 Encounters:  12/23/18 192 lb (87.1 kg)  12/05/18 190 lb 14.7 oz (86.6 kg)  12/05/18 191 lb (86.6 kg)    General: Chronically ill-appearing woman, no distress.  HEENT: Conjunctiva and lids normal, wearing a mask. Neck: Supple, no elevated JVP or carotid bruits, no thyromegaly. Lungs: Clear to auscultation, nonlabored breathing at rest. Cardiac: Regular rate and rhythm, no S3, 0-8/6 systolic murmur, no pericardial rub. Abdomen: Soft, nontender, bowel sounds present. Extremities: Mild ankle edema, distal pulses 2+. Skin: Warm and dry. Musculoskeletal: No kyphosis. Neuropsychiatric: Alert and oriented x3, affect grossly appropriate.  ECG:  An ECG dated 09/12/2018 was personally reviewed today and demonstrated:  Sinus  rhythm with low voltage.  Recent Labwork: 09/12/2018: ALT 33; AST 25 12/05/2018: B Natriuretic Peptide 81.0; BUN 19; Creatinine, Ser 3.93; Hemoglobin 8.9; Magnesium 1.9; Platelets 386; Potassium 3.1; Sodium 137     Component Value Date/Time   CHOL 199 03/08/2018 1234   TRIG 242 (H) 03/08/2018 1234   HDL 37 (L) 03/08/2018 1234   CHOLHDL 5.4 03/08/2018 1234   VLDL 48 (H) 03/08/2018 1234   LDLCALC 114 (H) 03/08/2018 1234   LDLCALC 109 (H) 11/12/2017 0936   LDLDIRECT 107 05/19/2016 1344    Other Studies Reviewed Today:  Echocardiogram 09/01/2017: Study Conclusions  - Left ventricle: The cavity size was normal. Systolic function was normal. The estimated ejection fraction was in the range of 55% to 60%. Wall motion was normal; there were no regional wall motion abnormalities. Doppler parameters are consistent with abnormal left ventricular relaxation (grade 1 diastolic dysfunction). - Left atrium: The atrium was mildly dilated.  Lexiscan Myoview 07/06/2016:  There was no ST segment deviation noted during stress.  Findings consistent with prior moderate anterior myocardial infarction with moderate peri-infarct ischemia. SSS 10 SRS 4 SDS 6  The left ventricular ejection fraction is normal (55-65%).  This is an intermediate risk study.  Assessment and Plan:  1.  Preoperative cardiac evaluation as part of ongoing renal transplant evaluation through [facility].  Echocardiogram requested and will be obtained particularly in light of heart murmur.  Since she already underwent a Lexiscan Myoview in April 2018 that was abnormal suggesting anterior scar with moderate peri-infarct ischemia and overall intermediate risk, follow-up noninvasive ischemic testing will not be pursued at this point and we will go ahead with a diagnostic cardiac catheterization.  She is on medical therapy without obvious angina at this point at low level activity.  Clarifying coronary anatomy will be important  prior to clearing her for renal transplantation.  Risks and benefits discussed and she is in agreement to proceed.  This will be scheduled for next week coordinated with her hemodialysis schedule.  2.  ESRD on hemodialysis.  3.  Essential hypertension.  4.  Mixed hyperlipidemia on Zetia.  Medication Adjustments/Labs and Tests Ordered: Current medicines are reviewed at length with the patient today.  Concerns regarding medicines are outlined above.   Tests  Ordered: Orders Placed This Encounter  Procedures  . CBC  . Basic metabolic panel  . ECHOCARDIOGRAM COMPLETE    Medication Changes: No orders of the defined types were placed in this encounter.   Disposition:  Follow up after procedure.  Signed, Satira Sark, MD, [facility] 12/23/2018 4:08 PM    [facility] [place], [place], [place] [address] Phone: [phone]; Fax: [phone]

## 2018-12-23 NOTE — Telephone Encounter (Signed)
°  Precert needed for: Echo  Location: [facility]    Date: Jan 05, 2019

## 2018-12-23 NOTE — Progress Notes (Signed)
Cardiology Office Note  Date: 12/23/2018   ID: Kathryn Beck, Kathryn Beck 1955/04/10, MRN [id number]  PCP:  Fayrene Helper, MD  Cardiologist:  Rozann Lesches, MD Electrophysiologist:  None   Chief Complaint  Patient presents with  . Preoperative cardiac evaluation    History of Present Illness: Kathryn Beck is a 63 y.o. female last assessed via telehealth encounter by Ms. Barrett PA-C in July.  She presents today with a family member for follow-up.  Since I last saw her she did manifest progressive renal failure and has been on hemodialysis since earlier this year.  She was temporarily on a trial of peritoneal dialysis but had problems with her catheter and just had it revised.  Her hemodialysis sessions are now on Monday, Wednesday, and Friday.  She is undergoing renal transplant evaluation through [facility] and it has been requested that she undergo a follow-up echocardiogram as well as a stress echocardiogram, although I cannot tell based on review of the notes whether it was understood that she had already undergone prior noninvasive ischemic testing that was abnormal.  She underwent a Lexiscan Myoview in April 2018 that demonstrated evidence of anterior scar with moderate peri-infarct ischemia, overall intermediate risk study.  We did not pursue cardiac catheterization at that time in the setting of renal insufficiency that was not yet at the point of requiring hemodialysis.  She does not report chest pain with her dialysis sessions.  Current cardiac medications include aspirin, Toprol-XL, Imdur, and Norvasc.  Past Medical History:  Diagnosis Date  . Acid reflux   . Anemia   . Arthritis   . Axillary masses    Soft tissue - status post excision  . Back pain   . Depression   . Diastolic heart failure ([place])   . End-stage renal disease ([place])    TTHSat  in [place]  . Essential hypertension   . History of blood transfusion   . History of claustrophobia   . History of  pneumonia 2019  . Ischemic heart disease    Abnormal Myoview April 2018 - medical therapy  . Mixed hyperlipidemia   . Obesity   . Pancreatitis   . Peritoneal dialysis catheter in place [facility])   . Sleep apnea    Noncompliant with CPAP  . Type 2 diabetes mellitus ([place])     Past Surgical History:  Procedure Laterality Date  . ABDOMINAL HYSTERECTOMY    . AV FISTULA PLACEMENT Left 09/02/2017   Procedure: creation of left arm ARTERIOVENOUS (AV) FISTULA;  Surgeon: Serafina Mitchell, MD;  Location: [facility] OR;  Service: Vascular;  Laterality: Left;  . COLONOSCOPY  2008   Dr. Oneida Alar: normal   . COLONOSCOPY N/A 12/18/2016   Dr. Oneida Alar: multiple tubular adenomas, internal hemorrhoids. Surveillance in 3 years   . ESOPHAGEAL DILATION N/A 10/13/2015   Procedure: ESOPHAGEAL DILATION;  Surgeon: Rogene Houston, MD;  [facility];  Service: Endoscopy;  Laterality: N/A;  . ESOPHAGOGASTRODUODENOSCOPY N/A 10/13/2015   Dr. Laural Golden: chronic gastritis on path, no H.pylori. Empiric dilation   . ESOPHAGOGASTRODUODENOSCOPY N/A 12/18/2016   Dr. Oneida Alar: mild gastritis. BRAVO study revealed uncontrolled GERD. Dysphagia secondary to uncontrolled reflux  . FOOT SURGERY Bilateral    "nerve"    . LUNG BIOPSY    . MASS EXCISION Right 01/09/2013   Procedure: EXCISION OF NEOPLASM OF RIGHT  AXILLA  AND EXCISION OF NEOPLASM OF LEFT AXILLA;  Surgeon: Jamesetta So, MD;  Location: [facility] ORS;  Service: General;  Laterality: Right;  procedure end @ 08:23  . MYRINGOTOMY WITH TUBE PLACEMENT Bilateral 04/28/2017   Procedure: BILATERAL MYRINGOTOMY WITH TUBE PLACEMENT;  Surgeon: Leta Baptist, MD;  Location: [place];  Service: ENT;  Laterality: Bilateral;  . REVISION OF ARTERIOVENOUS GORETEX GRAFT Left 05/04/2018   Procedure: TRANSPOSITION OF CEPHALIC VEIN ARTERIOVENOUS FISTULA LEFT ARM;  Surgeon: Rosetta Posner, MD;  Location: [place];  Service: Vascular;  Laterality: Left;  . SAVORY DILATION N/A 12/18/2016   Procedure: SAVORY DILATION;   Surgeon: Danie Binder, MD;  [facility];  Service: Endoscopy;  Laterality: N/A;    Current Outpatient Medications  Medication Sig Dispense Refill  . acetaminophen (TYLENOL) 500 MG tablet Take 1,000 mg by mouth at bedtime.     Marland Kitchen amLODipine (NORVASC) 10 MG tablet TAKE 1 TABLET BY MOUTH ONCE DAILY. (Patient taking differently: Take 10 mg by mouth daily. ) 28 tablet 10  . cloNIDine (CATAPRES) 0.3 MG tablet Take 1 tablet at 6 AM take 1/2 tablet at 2 PM and take 1 tablet at 10 PM every day (Patient taking differently: Take 0.3 mg by mouth See admin instructions. Take 1 tablet at 6 AM take 1/2 tablet at 2 PM and take 1 tablet at 10 PM every day) 75 tablet 5  . dexlansoprazole (DEXILANT) 60 MG capsule TAKE 1 CAPSULE IN THE MORNING WITH FOOD (Patient taking differently: Take 60 mg by mouth daily with breakfast. ) 28 capsule 10  . ezetimibe (ZETIA) 10 MG tablet TAKE 1 TABLET BY MOUTH ONCE DAILY. 28 tablet 11  . furosemide (LASIX) 80 MG tablet Take 80 mg by mouth 2 (two) times daily.    Marland Kitchen gabapentin (NEURONTIN) 300 MG capsule TAKE (1) CAPSULE BY MOUTH THREE TIMES DAILY 84 capsule 3  . GNP ASPIRIN LOW DOSE 81 MG EC tablet TAKE 1 TABLET BY MOUTH ONCE DAILY. (Patient taking differently: Take 81 mg by mouth daily. ) 28 tablet 10  . Insulin Degludec (TRESIBA FLEXTOUCH) 200 UNIT/ML SOPN Inject 50 Units into the skin every morning.     . Insulin Lispro (HUMALOG KWIKPEN) 200 UNIT/ML SOPN Inject 18-25 Units into the skin 3 (three) times daily before meals. 30 mL 5  . isosorbide mononitrate (IMDUR) 30 MG 24 hr tablet TAKE 1 TABLET BY MOUTH ONCE DAILY. 28 tablet 11  . metoprolol succinate (TOPROL-XL) 50 MG 24 hr tablet Take 50 mg by mouth every morning.    . nystatin (MYCOSTATIN) 100000 UNIT/ML suspension Take 5 mLs (500,000 Units total) by mouth 4 (four) times daily. 60 mL 0  . ondansetron (ZOFRAN) 4 MG tablet Take 1 tablet (4 mg total) by mouth every 8 (eight) hours as needed for nausea or vomiting. 30  tablet 1  . sertraline (ZOLOFT) 50 MG tablet TAKE 1 TABLET BY MOUTH ONCE DAILY. 28 tablet 4  . [name] 500 MG chewable tablet Chew 500 mg by mouth See admin instructions. Take one tablet by mouth three times daily with meals and with take one tablet twice daily with snacks    . zolpidem (AMBIEN) 10 MG tablet Take 10 mg by mouth at bedtime.      No current facility-administered medications for this visit.    Allergies:  Ace inhibitors, Penicillins, Statins, and Albuterol   Social History: The patient  reports that she has never smoked. She has never used smokeless tobacco. She reports that she does not drink alcohol or use drugs.   Family History: The patient's family history includes Arthritis in her  father; Breast cancer in her sister; Hypercholesterolemia in her father and sister; Hypertension in her father, sister, and sister.   ROS:  Please see the history of present illness. Otherwise, complete review of systems is positive for none.  All other systems are reviewed and negative.   Physical Exam: VS:  BP (!) 142/50   Pulse (!) 101   Ht 5\' 4"  (1.626 m)   Wt 192 lb (87.1 kg)   SpO2 98%   BMI 32.96 kg/m , BMI Body mass index is 32.96 kg/m.  Wt Readings from Last 3 Encounters:  12/23/18 192 lb (87.1 kg)  12/05/18 190 lb 14.7 oz (86.6 kg)  12/05/18 191 lb (86.6 kg)    General: Chronically ill-appearing woman, no distress.  HEENT: Conjunctiva and lids normal, wearing a mask. Neck: Supple, no elevated JVP or carotid bruits, no thyromegaly. Lungs: Clear to auscultation, nonlabored breathing at rest. Cardiac: Regular rate and rhythm, no S3, 6-2/3 systolic murmur, no pericardial rub. Abdomen: Soft, nontender, bowel sounds present. Extremities: Mild ankle edema, distal pulses 2+. Skin: Warm and dry. Musculoskeletal: No kyphosis. Neuropsychiatric: Alert and oriented x3, affect grossly appropriate.  ECG:  An ECG dated 09/12/2018 was personally reviewed today and demonstrated:  Sinus  rhythm with low voltage.  Recent Labwork: 09/12/2018: ALT 33; AST 25 12/05/2018: B Natriuretic Peptide 81.0; BUN 19; Creatinine, Ser 3.93; Hemoglobin 8.9; Magnesium 1.9; Platelets 386; Potassium 3.1; Sodium 137     Component Value Date/Time   CHOL 199 03/08/2018 1234   TRIG 242 (H) 03/08/2018 1234   HDL 37 (L) 03/08/2018 1234   CHOLHDL 5.4 03/08/2018 1234   VLDL 48 (H) 03/08/2018 1234   LDLCALC 114 (H) 03/08/2018 1234   LDLCALC 109 (H) 11/12/2017 0936   LDLDIRECT 107 05/19/2016 1344    Other Studies Reviewed Today:  Echocardiogram 09/01/2017: Study Conclusions  - Left ventricle: The cavity size was normal. Systolic function was normal. The estimated ejection fraction was in the range of 55% to 60%. Wall motion was normal; there were no regional wall motion abnormalities. Doppler parameters are consistent with abnormal left ventricular relaxation (grade 1 diastolic dysfunction). - Left atrium: The atrium was mildly dilated.  Lexiscan Myoview 07/06/2016:  There was no ST segment deviation noted during stress.  Findings consistent with prior moderate anterior myocardial infarction with moderate peri-infarct ischemia. SSS 10 SRS 4 SDS 6  The left ventricular ejection fraction is normal (55-65%).  This is an intermediate risk study.  Assessment and Plan:  1.  Preoperative cardiac evaluation as part of ongoing renal transplant evaluation through [facility].  Echocardiogram requested and will be obtained particularly in light of heart murmur.  Since she already underwent a Lexiscan Myoview in April 2018 that was abnormal suggesting anterior scar with moderate peri-infarct ischemia and overall intermediate risk, follow-up noninvasive ischemic testing will not be pursued at this point and we will go ahead with a diagnostic cardiac catheterization.  She is on medical therapy without obvious angina at this point at low level activity.  Clarifying coronary anatomy will be important  prior to clearing her for renal transplantation.  Risks and benefits discussed and she is in agreement to proceed.  This will be scheduled for next week coordinated with her hemodialysis schedule.  2.  ESRD on hemodialysis.  3.  Essential hypertension.  4.  Mixed hyperlipidemia on Zetia.  Medication Adjustments/Labs and Tests Ordered: Current medicines are reviewed at length with the patient today.  Concerns regarding medicines are outlined above.   Tests  Ordered: Orders Placed This Encounter  Procedures  . CBC  . Basic metabolic panel  . ECHOCARDIOGRAM COMPLETE    Medication Changes: No orders of the defined types were placed in this encounter.   Disposition:  Follow up after procedure.  Signed, Satira Sark, MD, [facility] 12/23/2018 4:08 PM    [facility] at [place], [place], [place] [address] Phone: [phone]; Fax: [phone]

## 2018-12-26 ENCOUNTER — Other Ambulatory Visit (HOSPITAL_COMMUNITY)
Admission: RE | Admit: 2018-12-26 | Discharge: 2018-12-26 | Disposition: A | Discharge status: Home / Self Care | Payer: 59 | Source: Ambulatory Visit | Attending: Interventional Cardiology | Admitting: Interventional Cardiology

## 2018-12-26 ENCOUNTER — Other Ambulatory Visit: Payer: Self-pay

## 2018-12-26 DIAGNOSIS — Z01818 Encounter for other preprocedural examination: Secondary | ICD-10-CM | POA: Diagnosis present

## 2018-12-26 DIAGNOSIS — Z20828 Contact with and (suspected) exposure to other viral communicable diseases: Secondary | ICD-10-CM | POA: Insufficient documentation

## 2018-12-26 LAB — SARS CORONAVIRUS 2 (TAT 6-24 HRS): SARS Coronavirus 2: NEGATIVE

## 2018-12-27 ENCOUNTER — Telehealth: Payer: Self-pay | Admitting: *Deleted

## 2018-12-27 NOTE — Telephone Encounter (Signed)
Pt contacted pre-catheterization scheduled at [facility] for: Thursday December 29, 2018 10:30 AM Verified arrival time and place: [facility]) at: 8 AM -needs lab.    No solid food after midnight prior to cath, clear liquids until 5 AM day of procedure. Contrast allergy: no  Hold: Insulin-AM of procedure. 1/2 usual long acting Insulin HS prior to procedure. Lasix-AM of procedure  Except hold medications AM meds can be  taken pre-cath with sip of water including: ASA 81 mg   Confirmed patient has responsible adult to drive home post procedure and observe 24 hours after arriving home: yes  Currently, due to Covid-19 pandemic, only one support person will be allowed with patient. Must be the same support person for that patient's entire stay, will be screened and required to wear a mask. They will be asked to wait in the waiting room for the duration of the patient's stay.  Patients are required to wear a mask when they enter the hospital.     COVID-19 Pre-Screening Questions:  . In the past 7 to 10 days have you had a cough,  shortness of breath, headache, congestion, fever (100 or greater) body aches, chills, sore throat, or sudden loss of taste or sense of smell? no . Have you been around anyone with known Covid 19? no . Have you been around anyone who is awaiting Covid 19 test results in the past 7 to 10 days? no . Have you been around anyone who has been exposed to Covid 19, or has mentioned symptoms of Covid 19 within the past 7 to 10 days? no   I reviewed procedure/mask/visitor instructions, Covid-19 screening questions with patient, she verbalized understanding, thanked me for call. Pt states she has quarantined since Covid-19 test after dialysis yesterday, will wear mask to dialysis tomorrow, continue to quarantine until procedure 12/29/18.

## 2018-12-29 ENCOUNTER — Encounter (HOSPITAL_COMMUNITY): Payer: Self-pay | Admitting: Interventional Cardiology

## 2018-12-29 ENCOUNTER — Ambulatory Visit (HOSPITAL_COMMUNITY)
Admission: RE | Admit: 2018-12-29 | Discharge: 2018-12-29 | Disposition: A | Discharge status: Home / Self Care | Payer: 59 | Attending: Interventional Cardiology | Admitting: Interventional Cardiology

## 2018-12-29 ENCOUNTER — Encounter (HOSPITAL_COMMUNITY)
Admission: RE | Disposition: A | Discharge status: Home / Self Care | Payer: 59 | Source: Home / Self Care | Attending: Interventional Cardiology

## 2018-12-29 ENCOUNTER — Other Ambulatory Visit: Payer: Self-pay

## 2018-12-29 DIAGNOSIS — Z79899 Other long term (current) drug therapy: Secondary | ICD-10-CM | POA: Diagnosis not present

## 2018-12-29 DIAGNOSIS — I5032 Chronic diastolic (congestive) heart failure: Secondary | ICD-10-CM | POA: Diagnosis not present

## 2018-12-29 DIAGNOSIS — Z880 Allergy status to penicillin: Secondary | ICD-10-CM | POA: Insufficient documentation

## 2018-12-29 DIAGNOSIS — Z6832 Body mass index (BMI) 32.0-32.9, adult: Secondary | ICD-10-CM | POA: Insufficient documentation

## 2018-12-29 DIAGNOSIS — Z95828 Presence of other vascular implants and grafts: Secondary | ICD-10-CM | POA: Diagnosis not present

## 2018-12-29 DIAGNOSIS — N186 End stage renal disease: Secondary | ICD-10-CM | POA: Diagnosis not present

## 2018-12-29 DIAGNOSIS — Z90710 Acquired absence of both cervix and uterus: Secondary | ICD-10-CM | POA: Insufficient documentation

## 2018-12-29 DIAGNOSIS — Z992 Dependence on renal dialysis: Secondary | ICD-10-CM | POA: Insufficient documentation

## 2018-12-29 DIAGNOSIS — K219 Gastro-esophageal reflux disease without esophagitis: Secondary | ICD-10-CM | POA: Insufficient documentation

## 2018-12-29 DIAGNOSIS — F329 Major depressive disorder, single episode, unspecified: Secondary | ICD-10-CM | POA: Diagnosis not present

## 2018-12-29 DIAGNOSIS — E1122 Type 2 diabetes mellitus with diabetic chronic kidney disease: Secondary | ICD-10-CM | POA: Insufficient documentation

## 2018-12-29 DIAGNOSIS — E669 Obesity, unspecified: Secondary | ICD-10-CM | POA: Diagnosis not present

## 2018-12-29 DIAGNOSIS — E782 Mixed hyperlipidemia: Secondary | ICD-10-CM | POA: Diagnosis not present

## 2018-12-29 DIAGNOSIS — M1990 Unspecified osteoarthritis, unspecified site: Secondary | ICD-10-CM | POA: Diagnosis not present

## 2018-12-29 DIAGNOSIS — Z794 Long term (current) use of insulin: Secondary | ICD-10-CM | POA: Diagnosis not present

## 2018-12-29 DIAGNOSIS — G4730 Sleep apnea, unspecified: Secondary | ICD-10-CM | POA: Diagnosis not present

## 2018-12-29 DIAGNOSIS — Z888 Allergy status to other drugs, medicaments and biological substances status: Secondary | ICD-10-CM | POA: Insufficient documentation

## 2018-12-29 DIAGNOSIS — I252 Old myocardial infarction: Secondary | ICD-10-CM | POA: Insufficient documentation

## 2018-12-29 DIAGNOSIS — R9439 Abnormal result of other cardiovascular function study: Secondary | ICD-10-CM

## 2018-12-29 DIAGNOSIS — I132 Hypertensive heart and chronic kidney disease with heart failure and with stage 5 chronic kidney disease, or end stage renal disease: Secondary | ICD-10-CM | POA: Diagnosis not present

## 2018-12-29 DIAGNOSIS — Z8249 Family history of ischemic heart disease and other diseases of the circulatory system: Secondary | ICD-10-CM | POA: Insufficient documentation

## 2018-12-29 HISTORY — PX: LEFT HEART CATH AND CORONARY ANGIOGRAPHY: CATH118249

## 2018-12-29 LAB — CBC
HCT: 28.2 % — ABNORMAL LOW (ref 36.0–46.0)
Hemoglobin: 8.4 g/dL — ABNORMAL LOW (ref 12.0–15.0)
MCH: 23.7 pg — ABNORMAL LOW (ref 26.0–34.0)
MCHC: 29.8 g/dL — ABNORMAL LOW (ref 30.0–36.0)
MCV: 79.7 fL — ABNORMAL LOW (ref 80.0–100.0)
Platelets: 408 10*3/uL — ABNORMAL HIGH (ref 150–400)
RBC: 3.54 MIL/uL — ABNORMAL LOW (ref 3.87–5.11)
RDW: 16.1 % — ABNORMAL HIGH (ref 11.5–15.5)
WBC: 8.6 10*3/uL (ref 4.0–10.5)
nRBC: 0 % (ref 0.0–0.2)

## 2018-12-29 LAB — BASIC METABOLIC PANEL
Anion gap: 13 (ref 5–15)
BUN: 21 mg/dL (ref 8–23)
CO2: 26 mmol/L (ref 22–32)
Calcium: 9.6 mg/dL (ref 8.9–10.3)
Chloride: 101 mmol/L (ref 98–111)
Creatinine, Ser: 4.56 mg/dL — ABNORMAL HIGH (ref 0.44–1.00)
GFR calc Af Amer: 11 mL/min — ABNORMAL LOW (ref >60)
GFR calc non Af Amer: 10 mL/min — ABNORMAL LOW (ref >60)
Glucose, Bld: 189 mg/dL — ABNORMAL HIGH (ref 70–99)
Potassium: 4 mmol/L (ref 3.5–5.1)
Sodium: 140 mmol/L (ref 135–145)

## 2018-12-29 LAB — GLUCOSE, CAPILLARY: Glucose-Capillary: 156 mg/dL — ABNORMAL HIGH (ref 70–99)

## 2018-12-29 SURGERY — LEFT HEART CATH AND CORONARY ANGIOGRAPHY
Anesthesia: LOCAL

## 2018-12-29 MED ORDER — HEPARIN (PORCINE) IN NACL 1000-0.9 UT/500ML-% IV SOLN
INTRAVENOUS | Status: AC
  Filled 2018-12-29: qty 1000

## 2018-12-29 MED ORDER — SODIUM CHLORIDE 0.9 % IV SOLN: 250.0000 mL | INTRAVENOUS | Status: DC | PRN

## 2018-12-29 MED ORDER — SODIUM CHLORIDE 0.9% FLUSH: 3.0000 mL | Freq: Two times a day (BID) | INTRAVENOUS | Status: DC

## 2018-12-29 MED ORDER — ASPIRIN 81 MG PO CHEW
81.0000 mg | CHEWABLE_TABLET | ORAL | Status: AC
  Administered 2018-12-29: 09:00:00 81 mg via ORAL
  Filled 2018-12-29: qty 1

## 2018-12-29 MED ORDER — LIDOCAINE HCL (PF) 1 % IJ SOLN
INTRAMUSCULAR | Status: AC
  Filled 2018-12-29: qty 30

## 2018-12-29 MED ORDER — FENTANYL CITRATE (PF) 100 MCG/2ML IJ SOLN
INTRAMUSCULAR | Status: DC | PRN
  Administered 2018-12-29: 25 ug via INTRAVENOUS

## 2018-12-29 MED ORDER — ONDANSETRON HCL 4 MG/2ML IJ SOLN: 4.0000 mg | Freq: Four times a day (QID) | INTRAMUSCULAR | Status: DC | PRN

## 2018-12-29 MED ORDER — LIDOCAINE HCL (PF) 1 % IJ SOLN
INTRAMUSCULAR | Status: DC | PRN
  Administered 2018-12-29: 15 mL

## 2018-12-29 MED ORDER — FENTANYL CITRATE (PF) 100 MCG/2ML IJ SOLN
INTRAMUSCULAR | Status: AC
  Filled 2018-12-29: qty 2

## 2018-12-29 MED ORDER — MIDAZOLAM HCL 2 MG/2ML IJ SOLN
INTRAMUSCULAR | Status: DC | PRN
  Administered 2018-12-29: 2 mg via INTRAVENOUS

## 2018-12-29 MED ORDER — LABETALOL HCL 5 MG/ML IV SOLN: 10.0000 mg | INTRAVENOUS | Status: DC | PRN

## 2018-12-29 MED ORDER — HEPARIN (PORCINE) IN NACL 1000-0.9 UT/500ML-% IV SOLN
INTRAVENOUS | Status: DC | PRN
  Administered 2018-12-29 (×2): 500 mL

## 2018-12-29 MED ORDER — MIDAZOLAM HCL 2 MG/2ML IJ SOLN
INTRAMUSCULAR | Status: AC
  Filled 2018-12-29: qty 2

## 2018-12-29 MED ORDER — SODIUM CHLORIDE 0.9 % IV SOLN
INTRAVENOUS | Status: DC
  Administered 2018-12-29: 09:00:00 via INTRAVENOUS

## 2018-12-29 MED ORDER — LABETALOL HCL 5 MG/ML IV SOLN
INTRAVENOUS | Status: AC
  Filled 2018-12-29: qty 4

## 2018-12-29 MED ORDER — LABETALOL HCL 5 MG/ML IV SOLN
INTRAVENOUS | Status: DC | PRN
  Administered 2018-12-29 (×2): 10 mg via INTRAVENOUS

## 2018-12-29 MED ORDER — SODIUM CHLORIDE 0.9% FLUSH: 3.0000 mL | INTRAVENOUS | Status: DC | PRN

## 2018-12-29 MED ORDER — HYDRALAZINE HCL 20 MG/ML IJ SOLN: 10.0000 mg | INTRAMUSCULAR | Status: DC | PRN

## 2018-12-29 MED ORDER — ACETAMINOPHEN 325 MG PO TABS: 650.0000 mg | ORAL_TABLET | ORAL | Status: DC | PRN

## 2018-12-29 SURGICAL SUPPLY — 9 items
CATH DXT MULTI JL4 JR4 ANG PIG (CATHETERS) ×1 IMPLANT
CLOSURE MYNX CONTROL 5F (Vascular Products) ×1 IMPLANT
KIT HEART LEFT (KITS) ×2 IMPLANT
PACK CARDIAC CATHETERIZATION (CUSTOM PROCEDURE TRAY) ×2 IMPLANT
SHEATH PINNACLE 5F 10CM (SHEATH) ×1 IMPLANT
SHEATH PROBE COVER 6X72 (BAG) ×1 IMPLANT
TRANSDUCER W/STOPCOCK (MISCELLANEOUS) ×2 IMPLANT
TUBING CIL FLEX 10 FLL-RA (TUBING) ×2 IMPLANT
WIRE EMERALD 3MM-J .035X150CM (WIRE) ×1 IMPLANT

## 2018-12-29 NOTE — Interval H&P Note (Signed)
Cath Lab Visit (complete for each Cath Lab visit)  Clinical Evaluation Leading to the Procedure:   ACS: No.  Non-ACS:    Anginal Classification: CCS II  Anti-ischemic medical therapy: Minimal Therapy (1 class of medications)  Non-Invasive Test Results: Intermediate-risk stress test findings: cardiac mortality 1-3%/year  Prior CABG: No previous CABG      History and Physical Interval Note:  12/29/2018 10:16 AM  Kathryn Beck  has presented today for surgery, with the diagnosis of abnormal stress test.  The various methods of treatment have been discussed with the patient and family. After consideration of risks, benefits and other options for treatment, the patient has consented to  Procedure(s): LEFT HEART CATH AND CORONARY ANGIOGRAPHY (N/A) as a surgical intervention.  The patient's history has been reviewed, patient examined, no change in status, stable for surgery.  I have reviewed the patient's chart and labs.  Questions were answered to the patient's satisfaction.     Larae Grooms

## 2018-12-29 NOTE — Discharge Instructions (Signed)
Femoral Site Care °This sheet gives you information about how to care for yourself after your procedure. Your health care provider may also give you more specific instructions. If you have problems or questions, contact your health care provider. °What can I expect after the procedure? °After the procedure, it is common to have: °· Bruising that usually fades within 1-2 weeks. °· Tenderness at the site. °Follow these instructions at home: °Wound care °· Follow instructions from your health care provider about how to take care of your insertion site. Make sure you: °? Wash your hands with soap and water before you change your bandage (dressing). If soap and water are not available, use hand sanitizer. °? Change your dressing as told by your health care provider. °? Leave stitches (sutures), skin glue, or adhesive strips in place. These skin closures may need to stay in place for 2 weeks or longer. If adhesive strip edges start to loosen and curl up, you may trim the loose edges. Do not remove adhesive strips completely unless your health care provider tells you to do that. °· Do not take baths, swim, or use a hot tub until your health care provider approves. °· You may shower 24-48 hours after the procedure or as told by your health care provider. °? Gently wash the site with plain soap and water. °? Pat the area dry with a clean towel. °? Do not rub the site. This may cause bleeding. °· Do not apply powder or lotion to the site. Keep the site clean and dry. °· Check your femoral site every day for signs of infection. Check for: °? Redness, swelling, or pain. °? Fluid or blood. °? Warmth. °? Pus or a bad smell. °Activity °· For the first 2-3 days after your procedure, or as long as directed: °? Avoid climbing stairs as much as possible. °? Do not squat. °· Do not lift anything that is heavier than 10 lb (4.5 kg), or the limit that you are told, until your health care provider says that it is safe. °· Rest as  directed. °? Avoid sitting for a long time without moving. Get up to take short walks every 1-2 hours. °· Do not drive for 24 hours if you were given a medicine to help you relax (sedative). °General instructions °· Take over-the-counter and prescription medicines only as told by your health care provider. °· Keep all follow-up visits as told by your health care provider. This is important. °Contact a health care provider if you have: °· A fever or chills. °· You have redness, swelling, or pain around your insertion site. °Get help right away if: °· The catheter insertion area swells very fast. °· You pass out. °· You suddenly start to sweat or your skin gets clammy. °· The catheter insertion area is bleeding, and the bleeding does not stop when you hold steady pressure on the area. °· The area near or just beyond the catheter insertion site becomes pale, cool, tingly, or numb. °These symptoms may represent a serious problem that is an emergency. Do not wait to see if the symptoms will go away. Get medical help right away. Call your local emergency services (911 in the [place] Do not drive yourself to the hospital. °Summary °· After the procedure, it is common to have bruising that usually fades within 1-2 weeks. °· Check your femoral site every day for signs of infection. °· Do not lift anything that is heavier than 10 lb (4.5 kg), or the   limit that you are told, until your health care provider says that it is safe. °This information is not intended to replace advice given to you by your health care provider. Make sure you discuss any questions you have with your health care provider. °Document Released: 11/10/2013 Document Revised: 03/22/2017 Document Reviewed: 03/22/2017 °Elsevier Patient Education © 2020 [employer] ° °

## 2018-12-30 ENCOUNTER — Ambulatory Visit: Payer: 59 | Admitting: Internal Medicine

## 2019-01-02 ENCOUNTER — Encounter: Payer: Self-pay | Admitting: Family Medicine

## 2019-01-02 ENCOUNTER — Ambulatory Visit (INDEPENDENT_AMBULATORY_CARE_PROVIDER_SITE_OTHER): Payer: 59 | Admitting: Family Medicine

## 2019-01-02 ENCOUNTER — Other Ambulatory Visit: Payer: Self-pay

## 2019-01-02 VITALS — BP 142/50 | Ht 64.0 in | Wt 192.0 lb

## 2019-01-02 DIAGNOSIS — I10 Essential (primary) hypertension: Secondary | ICD-10-CM | POA: Diagnosis not present

## 2019-01-02 DIAGNOSIS — N184 Chronic kidney disease, stage 4 (severe): Secondary | ICD-10-CM | POA: Diagnosis not present

## 2019-01-02 DIAGNOSIS — E1040 Type 1 diabetes mellitus with diabetic neuropathy, unspecified: Secondary | ICD-10-CM | POA: Insufficient documentation

## 2019-01-02 DIAGNOSIS — E1140 Type 2 diabetes mellitus with diabetic neuropathy, unspecified: Secondary | ICD-10-CM

## 2019-01-02 DIAGNOSIS — Z794 Long term (current) use of insulin: Secondary | ICD-10-CM

## 2019-01-02 DIAGNOSIS — Z1239 Encounter for other screening for malignant neoplasm of breast: Secondary | ICD-10-CM

## 2019-01-02 DIAGNOSIS — G4733 Obstructive sleep apnea (adult) (pediatric): Secondary | ICD-10-CM

## 2019-01-02 DIAGNOSIS — D472 Monoclonal gammopathy: Secondary | ICD-10-CM

## 2019-01-02 NOTE — Assessment & Plan Note (Addendum)
Increased tingling and pain in feet, refer neurology, multifactorial, diabetes long standing and uncontrolled, also has spine disease, currently on gabapentin , but reports worsening symptoms

## 2019-01-02 NOTE — Patient Instructions (Addendum)
Keep appointment as before, call if you need me sooner  PLEASE GET FLU VACCINE AT DIALYSIS DUE TO CURRENT QUARANTINE  YOU ARE REFERRED FOR MAMMOGRAM AND TO SEE DR [place] AS WE DISCUSSED.  THANKFUL THAT YOU ARE FEELING BETTER AND HAVE RECENTLY HAD GOOD TESTS  Thanks for choosing [facility], we consider it a privelige to serve you.

## 2019-01-02 NOTE — Progress Notes (Signed)
Virtual Visit via Telephone Note  I connected with Kathryn Beck on 01/02/19 at 11:00 AM EDT by telephone and verified that I am speaking with the correct person using two identifiers.  Location: Patient: home Provider:office   I discussed the limitations, risks, security and privacy concerns of performing an evaluation and management service by telephone and the availability of in person appointments. I also discussed with the patient that there may be a patient responsible charge related to this service. The patient expressed understanding and agreed to proceed.   History of Present Illness:   f/u chronic problems and evaluation following recent Ed visit when she was transported from the office by ambulance after a near syncopal episode in the office. She had been to dialysis that day ,and was exteremly hypotensive, which is not unusual for her Reports doing ell since that time, holds her BP meds until she completes dialysis and sometimes dose adjusts depending on her BP. Now self quarantining due to close exposure to her sister who had covid and was unaware. Kathryn Beck is asymptomatic , but is only going to dialysis Denies recent fever or chills. Denies sinus pressure, nasal congestion, ear pain or sore throat. Denies chest congestion, productive cough or wheezing. Denies chest pains, palpitations and leg swelling Denies abdominal pain, nausea, vomiting,diarrhea or constipation.   C/o numbness, tingling and pain in feet, wants neurology eval Denies depression, anxiety or insomnia. Denies skin break down or rash.     Observations/Objective: BP (!) 142/50   Ht 5\' 4"  (1.626 m)   Wt 192 lb (87.1 kg)   BMI 32.96 kg/m  ( pt reported vitals) Good communication with no confusion and intact memory. Alert and oriented x 3 No signs of respiratory distress during speech    Assessment and Plan:  Type 2 diabetes mellitus with diabetic neuropathy, unspecified (HCC) Increased tingling and  pain in feet, refer neurology, multifactorial, diabetes long standing and uncontrolled, also has spine disease, currently on gabapentin , but reports worsening symptoms  CKD (chronic kidney disease) stage 4, GFR 15-29 ml/min (HCC) Currently on hemodialysis but will transition to peritoneal dialysis  Malignant hypertension Improved control, sometimes significantly hypotensive post dialysis, however pt  Reports improved control over this  Sleep apnea Affirms compliance with CPAP  MGUS (monoclonal gammopathy of unknown significance) Followed by Hematology   Follow Up Instructions:    I discussed the assessment and treatment plan with the patient. The patient was provided an opportunity to ask questions and all were answered. The patient agreed with the plan and demonstrated an understanding of the instructions.   The patient was advised to call back or seek an in-person evaluation if the symptoms worsen or if the condition fails to improve as anticipated.  I provided 21 minutes of non-face-to-face time during this encounter.   Tula Nakayama, MD

## 2019-01-03 ENCOUNTER — Encounter: Payer: Self-pay | Admitting: Family Medicine

## 2019-01-03 NOTE — Assessment & Plan Note (Signed)
Improved control, sometimes significantly hypotensive post dialysis, however pt  Reports improved control over this

## 2019-01-03 NOTE — Assessment & Plan Note (Signed)
Currently on hemodialysis but will transition to peritoneal dialysis

## 2019-01-03 NOTE — Assessment & Plan Note (Signed)
Affirms compliance with CPAP

## 2019-01-03 NOTE — Assessment & Plan Note (Signed)
Followed by Hematology 

## 2019-01-04 ENCOUNTER — Ambulatory Visit: Payer: 59

## 2019-01-05 ENCOUNTER — Ambulatory Visit (HOSPITAL_COMMUNITY): Payer: 59 | Attending: Cardiology

## 2019-01-11 ENCOUNTER — Other Ambulatory Visit: Payer: Self-pay

## 2019-01-11 DIAGNOSIS — Z20822 Contact with and (suspected) exposure to covid-19: Secondary | ICD-10-CM

## 2019-01-12 LAB — NOVEL CORONAVIRUS, NAA: SARS-CoV-2, NAA: NOT DETECTED

## 2019-01-13 ENCOUNTER — Telehealth: Payer: Self-pay | Admitting: Family Medicine

## 2019-01-13 NOTE — Telephone Encounter (Signed)
Patient sister Artis Flock called in and received her covid test result

## 2019-01-17 ENCOUNTER — Ambulatory Visit (HOSPITAL_COMMUNITY)
Admission: RE | Admit: 2019-01-17 | Discharge: 2019-01-17 | Disposition: A | Discharge status: Home / Self Care | Payer: 59 | Source: Ambulatory Visit | Attending: Cardiology | Admitting: Cardiology

## 2019-01-17 ENCOUNTER — Other Ambulatory Visit: Payer: Self-pay

## 2019-01-17 DIAGNOSIS — R011 Cardiac murmur, unspecified: Secondary | ICD-10-CM | POA: Diagnosis present

## 2019-01-17 NOTE — Progress Notes (Signed)
*  PRELIMINARY RESULTS* Echocardiogram 2D Echocardiogram has been performed.  Kathryn Beck 01/17/2019, 11:34 AM

## 2019-01-18 ENCOUNTER — Telehealth: Payer: Self-pay | Admitting: *Deleted

## 2019-01-18 NOTE — Progress Notes (Signed)
Cardiology Office Note    Date:  01/24/2019   ID:  Kathryn Beck, DOB Nov 25, 1955, MRN [id number]  PCP:  Fayrene Helper, MD  Cardiologist: Rozann Lesches, MD EPS: None  No chief complaint on file.   History of Present Illness:  Kathryn Beck is a 63 y.o. female with history of hypertension, hyperlipidemia, ESRD on HD and heart murmur.  Patient saw Dr. Domenic Polite for preoperative clearance before undergoing renal transplant.  She has a history of an abnormal Lexiscan Myoview 06/2016 showing anterior scar with moderate peri-infarct ischemia overall considered intermediate risk study.  Cardiac cath was not pursued at that time because of renal disease and she was not on dialysis.  Cardiac cath 12/29/18 widely patent coronary arteries no AV stenosis.2 Decho 01/17/19 normal LVEF 60-65% mod LVH mod dilated LA, mild MR. Dr. Domenic Polite told her no further workup needed before undergoing renal transplant.  Patient comes in for post cath f/u. Denies cardiac complaints.   Past Medical History:  Diagnosis Date  . Acid reflux   . Anemia   . Arthritis   . Axillary masses    Soft tissue - status post excision  . Back pain   . Depression   . Diastolic heart failure ([place])   . End-stage renal disease ([place])    [facility]  in [place]  . Essential hypertension   . History of blood transfusion   . History of claustrophobia   . History of pneumonia 2019  . Ischemic heart disease    Abnormal Myoview April 2018 - medical therapy  . Mixed hyperlipidemia   . Obesity   . Pancreatitis   . Peritoneal dialysis catheter in place [facility])   . Sleep apnea    Noncompliant with CPAP  . Type 2 diabetes mellitus ([place])     Past Surgical History:  Procedure Laterality Date  . ABDOMINAL HYSTERECTOMY    . AV FISTULA PLACEMENT Left 09/02/2017   Procedure: creation of left arm ARTERIOVENOUS (AV) FISTULA;  Surgeon: Serafina Mitchell, MD;  Location: [facility] OR;  Service: Vascular;  Laterality: Left;  . COLONOSCOPY   2008   Dr. Oneida Alar: normal   . COLONOSCOPY N/A 12/18/2016   Dr. Oneida Alar: multiple tubular adenomas, internal hemorrhoids. Surveillance in 3 years   . ESOPHAGEAL DILATION N/A 10/13/2015   Procedure: ESOPHAGEAL DILATION;  Surgeon: Rogene Houston, MD;  [facility];  Service: Endoscopy;  Laterality: N/A;  . ESOPHAGOGASTRODUODENOSCOPY N/A 10/13/2015   Dr. Laural Golden: chronic gastritis on path, no H.pylori. Empiric dilation   . ESOPHAGOGASTRODUODENOSCOPY N/A 12/18/2016   Dr. Oneida Alar: mild gastritis. BRAVO study revealed uncontrolled GERD. Dysphagia secondary to uncontrolled reflux  . FOOT SURGERY Bilateral    "nerve"    . LEFT HEART CATH AND CORONARY ANGIOGRAPHY N/A 12/29/2018   Procedure: LEFT HEART CATH AND CORONARY ANGIOGRAPHY;  Surgeon: Jettie Booze, MD;  Location: [place] CV LAB;  Service: Cardiovascular;  Laterality: N/A;  . LUNG BIOPSY    . MASS EXCISION Right 01/09/2013   Procedure: EXCISION OF NEOPLASM OF RIGHT  AXILLA  AND EXCISION OF NEOPLASM OF LEFT AXILLA;  Surgeon: Jamesetta So, MD;  Location: [facility] ORS;  Service: General;  Laterality: Right;  procedure end @ 08:23  . MYRINGOTOMY WITH TUBE PLACEMENT Bilateral 04/28/2017   Procedure: BILATERAL MYRINGOTOMY WITH TUBE PLACEMENT;  Surgeon: Leta Baptist, MD;  Location: [place];  Service: ENT;  Laterality: Bilateral;  . REVISION OF ARTERIOVENOUS GORETEX GRAFT Left 05/04/2018   Procedure: TRANSPOSITION OF  CEPHALIC VEIN ARTERIOVENOUS FISTULA LEFT ARM;  Surgeon: Rosetta Posner, MD;  Location: [place];  Service: Vascular;  Laterality: Left;  . SAVORY DILATION N/A 12/18/2016   Procedure: SAVORY DILATION;  Surgeon: Danie Binder, MD;  [facility];  Service: Endoscopy;  Laterality: N/A;    Current Medications: Current Meds  Medication Sig  . acetaminophen (TYLENOL) 500 MG tablet Take 1,000 mg by mouth every 8 (eight) hours as needed for mild pain.   Marland Kitchen amLODipine (NORVASC) 10 MG tablet TAKE 1 TABLET BY MOUTH ONCE DAILY. (Patient  taking differently: Take 10 mg by mouth daily. )  . cloNIDine (CATAPRES) 0.3 MG tablet Take 1 tablet at 6 AM take 1/2 tablet at 2 PM and take 1 tablet at 10 PM every day (Patient taking differently: Take 0.3 mg by mouth See admin instructions. Take 0.3 mg at 6 AM take 0.15 mg at 2 PM and take 0.3 mg at 10 PM every day)  . dexlansoprazole (DEXILANT) 60 MG capsule TAKE 1 CAPSULE IN THE MORNING WITH FOOD (Patient taking differently: Take 60 mg by mouth daily with breakfast. )  . ezetimibe (ZETIA) 10 MG tablet TAKE 1 TABLET BY MOUTH ONCE DAILY. (Patient taking differently: Take 10 mg by mouth daily. )  . furosemide (LASIX) 80 MG tablet Take 80 mg by mouth 2 (two) times daily.  Marland Kitchen gabapentin (NEURONTIN) 300 MG capsule TAKE (1) CAPSULE BY MOUTH THREE TIMES DAILY (Patient taking differently: Take 300 mg by mouth 3 (three) times daily. )  . GNP ASPIRIN LOW DOSE 81 MG EC tablet TAKE 1 TABLET BY MOUTH ONCE DAILY. (Patient taking differently: Take 81 mg by mouth daily. )  . HYDROcodone-acetaminophen (NORCO/VICODIN) 5-325 MG tablet Take 1 tablet by mouth daily as needed for moderate pain.  . Insulin Degludec (TRESIBA FLEXTOUCH) 200 UNIT/ML SOPN Inject 50 Units into the skin every evening.   . Insulin Lispro (HUMALOG KWIKPEN) 200 UNIT/ML SOPN Inject 18-22 Units into the skin 3 (three) times daily before meals. 18 units before a smaller meal and 22 units before a larger meal  . isosorbide mononitrate (IMDUR) 30 MG 24 hr tablet TAKE 1 TABLET BY MOUTH ONCE DAILY. (Patient taking differently: Take 30 mg by mouth daily. )  . metoprolol succinate (TOPROL-XL) 50 MG 24 hr tablet Take 50 mg by mouth every morning.  . ondansetron (ZOFRAN) 4 MG tablet Take 1 tablet (4 mg total) by mouth every 8 (eight) hours as needed for nausea or vomiting.  . sertraline (ZOLOFT) 50 MG tablet TAKE 1 TABLET BY MOUTH ONCE DAILY. (Patient taking differently: Take 50 mg by mouth daily. )  . [name] 500 MG chewable tablet Chew 500 mg by mouth See  admin instructions. Take 500 mg with each snack and meal  . zolpidem (AMBIEN) 10 MG tablet Take 10 mg by mouth at bedtime as needed for sleep.      Allergies:   Ace inhibitors, Penicillins, Statins, and Albuterol   Social History   Socioeconomic History  . Marital status: Married    Spouse name: larry   . Number of children: 2  . Years of education: 51  . Highest education level: 12th grade  Occupational History  . Occupation: retired   Scientific laboratory technician  . Financial resource strain: Somewhat hard  . Food insecurity    Worry: Never true    Inability: Never true  . Transportation needs    Medical: No    Non-medical: No  Tobacco Use  .  Smoking status: Never Smoker  . Smokeless tobacco: Never Used  Substance and Sexual Activity  . Alcohol use: No  . Drug use: No  . Sexual activity: Not Currently  Lifestyle  . Physical activity    Days per week: 0 days    Minutes per session: 0 min  . Stress: Only a little  Relationships  . Social connections    Talks on phone: More than three times a week    Gets together: More than three times a week    Attends religious service: Never    Active member of club or organization: No    Attends meetings of clubs or organizations: Never    Relationship status: Married  Other Topics Concern  . Not on file  Social History Narrative   Lives alone with husband      Family History:  The patient's family history includes Arthritis in her father; Breast cancer in her sister; Hypercholesterolemia in her father and sister; Hypertension in her father, sister, and sister.   ROS:   Please see the history of present illness.    ROS All other systems reviewed and are negative.   PHYSICAL EXAM:   VS:  BP 136/62   Pulse 77   Temp (!) 97.3 F (36.3 C) (Temporal)   Ht 5\' 4"  (1.626 m)   Wt 229 lb (103.9 kg)   SpO2 97%   BMI 39.31 kg/m   Physical Exam  GEN: Well nourished, well developed, in no acute distress  Neck: no JVD, carotid bruits, or  masses Cardiac:RRR; no murmurs, rubs, or gallops  Respiratory:  clear to auscultation bilaterally, normal work of breathing GI: soft, nontender, nondistended, + BS Ext: right groin without hematoma or hemorrhage.without cyanosis, clubbing, or edema, Good distal pulses bilaterally Neuro:  Alert and Oriented x 3 Psych: euthymic mood, full affect  Wt Readings from Last 3 Encounters:  01/24/19 229 lb (103.9 kg)  01/02/19 192 lb (87.1 kg)  12/29/18 192 lb (87.1 kg)      Studies/Labs Reviewed:   EKG:  EKG is not ordered today.    Recent Labs: 09/12/2018: ALT 33 12/05/2018: B Natriuretic Peptide 81.0; Magnesium 1.9 12/29/2018: BUN 21; Creatinine, Ser 4.56; Hemoglobin 8.4; Platelets 408; Potassium 4.0; Sodium 140   Lipid Panel    Component Value Date/Time   CHOL 199 03/08/2018 1234   TRIG 242 (H) 03/08/2018 1234   HDL 37 (L) 03/08/2018 1234   CHOLHDL 5.4 03/08/2018 1234   VLDL 48 (H) 03/08/2018 1234   LDLCALC 114 (H) 03/08/2018 1234   LDLCALC 109 (H) 11/12/2017 0936   LDLDIRECT 107 05/19/2016 1344    Additional studies/ records that were reviewed today include:  Cardiac catheterization 12/29/2018  Large, widely patent coronary arteries.  LV end diastolic pressure is mildly elevated.  There is no aortic valve stenosis.   No significant CAD.  Continue medical therapy.   2D echo 10/27/2020IMPRESSIONS      1. Left ventricular ejection fraction, by visual estimation, is 60 to 65%. The left ventricle has normal function. There is moderately increased left ventricular hypertrophy.  2. Left ventricular diastolic Doppler parameters are consistent with impaired relaxation pattern of LV diastolic filling.  3. Global right ventricle has normal systolic function.The right ventricular size is normal. No increase in right ventricular wall thickness.  4. Left atrial size was moderately dilated.  5. Right atrial size was normal.  6. The mitral valve is grossly normal. Mild mitral valve  regurgitation.  7. The tricuspid  valve is grossly normal. Tricuspid valve regurgitation is mild.  8. The aortic valve is tricuspid Aortic valve regurgitation was not visualized by color flow Doppler.  9. The pulmonic valve was grossly normal. Pulmonic valve regurgitation is not visualized by color flow Doppler. 10. The inferior vena cava is dilated in size with >50% respiratory variability, suggesting right atrial pressure of 8 mmHg.   FINDINGS  Left Ventricle: Left ventricular ejection fraction, by visual estimation, is 60 to 65%. The left ventricle has normal function. No evidence of left ventricular regional wall motion abnormalities. There is moderately increased left ventricular  hypertrophy. Concentric left ventricular hypertrophy. Spectral Doppler shows Left ventricular diastolic Doppler parameters are consistent with impaired relaxation pattern of LV diastolic filling. Indeterminate filling pressures.   Right Ventricle: The right ventricular size is normal. No increase in right ventricular wall thickness. Global RV systolic function is has normal systolic function.   Left Atrium: Left atrial size was moderately dilated.   Right Atrium: Right atrial size was normal in size   Pericardium: There is no evidence of pericardial effusion.   Mitral Valve: The mitral valve is grossly normal. Mild mitral valve regurgitation.   Tricuspid Valve: The tricuspid valve is grossly normal. Tricuspid valve regurgitation is mild by color flow Doppler.   Aortic Valve: The aortic valve is tricuspid. Aortic valve regurgitation was not visualized by color flow Doppler. Aortic valve mean gradient measures 8.0 mmHg. Aortic valve peak gradient measures 17.6 mmHg. Aortic valve area, by VTI measures 2.83 cm.   Pulmonic Valve: The pulmonic valve was grossly normal. Pulmonic valve regurgitation is not visualized by color flow Doppler.   Aorta: The aortic root is normal in size and structure.   Venous: The  inferior vena cava is dilated in size with greater than 50% respiratory variability, suggesting right atrial pressure of 8 mmHg.   IAS/Shunts: No atrial level shunt detected by color flow Doppler.       LEFT VENTRICLE PLAX 2D LVIDd:         5.12 cm       Diastology LVIDs:         3.04 cm       LV e' lateral:   9.03 cm/s LV PW:         1.29 cm       LV E/e' lateral: 12.6 LV IVS:        1.37 cm       LV e' medial:    8.49 cm/s LVOT diam:     2.00 cm       LV E/e' medial:  13.4 LV SV:         89 ml LV SV Index:   44.08 LVOT Area:     3.14 cm   LV Volumes (MOD) LV area d, A2C:    31.20 cm LV area d, A4C:    29.90 cm LV area s, A2C:    14.00 cm LV area s, A4C:    13.50 cm LV major d, A2C:   8.23 cm LV major d, A4C:   7.99 cm LV major s, A2C:   6.10 cm LV major s, A4C:   6.30 cm LV vol d, MOD A2C: 100.0 ml LV vol d, MOD A4C: 92.1 ml LV vol s, MOD A2C: 28.6 ml LV vol s, MOD A4C: 24.3 ml LV SV MOD A2C:     71.4 ml LV SV MOD A4C:     92.1 ml LV SV MOD BP:  70.5 ml   RIGHT VENTRICLE RV S prime:     16.90 cm/s TAPSE (M-mode): 2.9 cm   LEFT ATRIUM             Index       RIGHT ATRIUM           Index LA diam:        4.30 cm 2.24 cm/m  RA Area:     14.80 cm LA Vol (A2C):   85.3 ml 44.36 ml/m RA Volume:   33.80 ml  17.58 ml/m LA Vol (A4C):   64.3 ml 33.44 ml/m LA Biplane Vol: 76.2 ml 39.63 ml/m  AORTIC VALVE AV Area (Vmax):    2.50 cm AV Area (Vmean):   2.91 cm AV Area (VTI):     2.83 cm AV Vmax:           210.00 cm/s AV Vmean:          123.000 cm/s AV VTI:            0.379 m AV Peak Grad:      17.6 mmHg AV Mean Grad:      8.0 mmHg LVOT Vmax:         167.00 cm/s LVOT Vmean:        114.000 cm/s LVOT VTI:          0.342 m LVOT/AV VTI ratio: 0.90   AORTA Ao Root diam: 3.30 cm   MITRAL VALVE MV Area (PHT): 4.39 cm              SHUNTS MV PHT:        50.17 msec            Systemic VTI:  0.34 m MV Decel Time: 173 msec              Systemic Diam: 2.00 cm MV  E velocity: 114.00 cm/s 103 cm/s MV A velocity: 130.00 cm/s 70.3 cm/s MV E/A ratio:  0.88        1.5     Kate Sable MD Electronically signed by Kate Sable MD Signature Date/Time: 01/17/2019/11:49:02 AM         Final       ASSESSMENT:    1. Hypertension associated with diabetes ([place])   2. Chronic diastolic CHF (congestive heart failure) ([place])   3. Abnormal myocardial perfusion study   4. Heart murmur      PLAN:  In order of problems listed above:  Hypertension well controlled  Chronic diastolic CHF 2D echo 46/50/3546 normal LVEF with impaired relaxation-on dialysis  History of abnormal myocardial perfusion study but cardiac cath 12/29/2018 showed widely patent coronary arteries-cleared for renal transplant list per Dr. Domenic Polite.  History of heart murmur 2D echo 01/17/2027 only show mild aortic regurgitation, mild MR mild TR  ESRD on peritoneal dialysis hoping for renal transplant at [facility].   Medication Adjustments/Labs and Tests Ordered: Current medicines are reviewed at length with the patient today.  Concerns regarding medicines are outlined above.  Medication changes, Labs and Tests ordered today are listed in the Patient Instructions below. There are no Patient Instructions on file for this visit.   Sumner Boast, PA-C  01/24/2019 11:56 AM    [facility] [address], [place], [place]  [address] Phone: [phone]; Fax: [phone]

## 2019-01-18 NOTE — Telephone Encounter (Signed)
Patient informed. Copy sent to PCP °

## 2019-01-18 NOTE — Telephone Encounter (Signed)
-----   Message from Satira Sark, MD sent at 01/17/2019 12:34 PM EDT ----- Results reviewed.  LVEF is normal at 60 to 65% with moderate LVH.  There are no substantial valvular abnormalities that would represent barrier to further work-up for renal transplantation.

## 2019-01-20 ENCOUNTER — Other Ambulatory Visit: Payer: Self-pay

## 2019-01-20 DIAGNOSIS — N186 End stage renal disease: Secondary | ICD-10-CM

## 2019-01-20 DIAGNOSIS — E1122 Type 2 diabetes mellitus with diabetic chronic kidney disease: Secondary | ICD-10-CM

## 2019-01-20 MED ORDER — HUMALOG KWIKPEN 200 UNIT/ML ~~LOC~~ SOPN: 18.0000 [IU] | PEN_INJECTOR | Freq: Three times a day (TID) | SUBCUTANEOUS | 5 refills | Status: DC

## 2019-01-24 ENCOUNTER — Ambulatory Visit (INDEPENDENT_AMBULATORY_CARE_PROVIDER_SITE_OTHER): Payer: 59 | Admitting: Physician Assistant

## 2019-01-24 ENCOUNTER — Other Ambulatory Visit: Payer: Self-pay

## 2019-01-24 ENCOUNTER — Encounter: Payer: Self-pay | Admitting: Physician Assistant

## 2019-01-24 VITALS — BP 136/62 | HR 77 | Temp 97.3°F | Ht 64.0 in | Wt 229.0 lb

## 2019-01-24 DIAGNOSIS — R9439 Abnormal result of other cardiovascular function study: Secondary | ICD-10-CM

## 2019-01-24 DIAGNOSIS — I5032 Chronic diastolic (congestive) heart failure: Secondary | ICD-10-CM

## 2019-01-24 DIAGNOSIS — Z992 Dependence on renal dialysis: Secondary | ICD-10-CM

## 2019-01-24 DIAGNOSIS — I10 Essential (primary) hypertension: Secondary | ICD-10-CM

## 2019-01-24 DIAGNOSIS — N186 End stage renal disease: Secondary | ICD-10-CM

## 2019-01-24 DIAGNOSIS — I152 Hypertension secondary to endocrine disorders: Secondary | ICD-10-CM

## 2019-01-24 DIAGNOSIS — R011 Cardiac murmur, unspecified: Secondary | ICD-10-CM | POA: Diagnosis not present

## 2019-01-24 DIAGNOSIS — E1159 Type 2 diabetes mellitus with other circulatory complications: Secondary | ICD-10-CM

## 2019-01-24 NOTE — Patient Instructions (Signed)
Medication Instructions:  Your physician recommends that you continue on your current medications as directed. Please refer to the Current Medication list given to you today.  *If you need a refill on your cardiac medications before your next appointment, please call your pharmacy*  Lab Work: NONE   If you have labs (blood work) drawn today and your tests are completely normal, you will receive your results only by: Marland Kitchen MyChart Message (if you have MyChart) OR . A paper copy in the mail If you have any lab test that is abnormal or we need to change your treatment, we will call you to review the results.  Testing/Procedures: NONE   Follow-Up: At [facility], you and your health needs are our priority.  As part of our continuing mission to provide you with exceptional heart care, we have created designated Provider Care Teams.  These Care Teams include your primary Cardiologist (physician) and Advanced Practice Providers (APPs -  Physician Assistants and Nurse Practitioners) who all work together to provide you with the care you need, when you need it.  Your next appointment:   12 months  The format for your next appointment:   In Person  Provider:   Rozann Lesches, MD  Other Instructions Thank you for choosing [facility]!

## 2019-01-25 ENCOUNTER — Other Ambulatory Visit (HOSPITAL_COMMUNITY): Payer: Self-pay | Admitting: *Deleted

## 2019-01-25 DIAGNOSIS — D638 Anemia in other chronic diseases classified elsewhere: Secondary | ICD-10-CM

## 2019-01-25 DIAGNOSIS — D472 Monoclonal gammopathy: Secondary | ICD-10-CM

## 2019-01-25 DIAGNOSIS — N184 Chronic kidney disease, stage 4 (severe): Secondary | ICD-10-CM

## 2019-01-26 ENCOUNTER — Inpatient Hospital Stay (HOSPITAL_COMMUNITY): Payer: 59 | Attending: Hematology

## 2019-01-26 ENCOUNTER — Ambulatory Visit (HOSPITAL_COMMUNITY): Payer: 59 | Admitting: Hematology

## 2019-01-30 ENCOUNTER — Other Ambulatory Visit: Payer: Self-pay

## 2019-01-30 LAB — HM DIABETES EYE EXAM

## 2019-01-31 ENCOUNTER — Encounter: Payer: Self-pay | Admitting: Internal Medicine

## 2019-01-31 ENCOUNTER — Ambulatory Visit (INDEPENDENT_AMBULATORY_CARE_PROVIDER_SITE_OTHER): Payer: 59 | Admitting: Internal Medicine

## 2019-01-31 DIAGNOSIS — N186 End stage renal disease: Secondary | ICD-10-CM | POA: Diagnosis not present

## 2019-01-31 DIAGNOSIS — E1122 Type 2 diabetes mellitus with diabetic chronic kidney disease: Secondary | ICD-10-CM | POA: Diagnosis not present

## 2019-01-31 LAB — POCT GLYCOSYLATED HEMOGLOBIN (HGB A1C): Hemoglobin A1C: 7.9 % — AB (ref 4.0–5.6)

## 2019-01-31 MED ORDER — HUMALOG KWIKPEN 200 UNIT/ML ~~LOC~~ SOPN: 24.0000 [IU] | PEN_INJECTOR | Freq: Three times a day (TID) | SUBCUTANEOUS | 5 refills | Status: DC

## 2019-01-31 MED ORDER — OZEMPIC (0.25 OR 0.5 MG/DOSE) 2 MG/1.5ML ~~LOC~~ SOPN: 0.5000 mg | PEN_INJECTOR | SUBCUTANEOUS | 5 refills | Status: DC

## 2019-01-31 MED ORDER — FREESTYLE LIBRE 14 DAY READER DEVI: 1.0000 | Freq: Once | 1 refills | Status: AC

## 2019-01-31 MED ORDER — FREESTYLE LIBRE 14 DAY SENSOR MISC: 1.0000 | 11 refills | Status: DC

## 2019-01-31 NOTE — Progress Notes (Signed)
Patient ID: Kathryn Beck, female   DOB: Nov 12, 1955, 63 y.o.   MRN: [id number]  HPI: NYLAH Beck is a 63 y.o.-year-old female, initially referred by her PCP, Dr. Moshe Cipro, returning for follow-up for of DM2, dx in 2008, insulin-dependent since ~2015, uncontrolled, with complications (CKD stage 4, PN). She saw Dr. Dorris Fetch - last OV with him 05/2016.  Last visit with me 4 months ago.  She was doing HD before, but 2 weeks before she started peritoneal dialysis 2 weeks ago. She was warned her sugars will be abnormal until the settings can be optimized.  Also, she retained a lot of fluid >> gained 37 pounds in the last month.  Adrenal masses.  Reviewed previous imaging reports: 05/28/2017: MRI abd: Bilateral adrenal nodules with signal dropout on out of phase imaging, consistent with adenomas.   Example at 2.5 cm on the left and on the order of 1.6 cm on the right.  10/10/2015: CT abdomen and pelvis without contrast: Stable 2.2 cm left adrenal nodule with Hounsfield unit measurements 28 unchanged likely a lipid poor adenoma  08/23/2008: MRI of the abdomen with and without contrast: Left adrenal mass measuring 1.6 x 1.5 cm (previously measuring 1.4 x 1.6 cm on the CT scan from 2003 -stable in size),  Of note, she has an enlarging pancreatic head mass - pt reports a h/o pancreatitis. (This will need to be eval. By GI)  Reviewed previous adrenal biochemical investigation:  Dexamethasone suppression test showed a nonsuppressed cortisol level, but I suspect that this is due to her end-stage renal disease and the deficiency cortisol clearance, rather than increased production: Component     Latest Ref Rng & Units 08/31/2018  Cortisol - AM     mcg/dL 6.1  Dexamethasone, Serum     ng/dL 446   A 24-hour urine cortisol cannot be done as she is on dialysis.  Previous investigation was negative for hyperaldosteronism or pheochromocytoma: Component     Latest Ref Rng & Units 10/04/2018           Epinephrine     pg/mL 24  Norepinephrine     pg/mL 167 (L)  Dopamine     pg/mL <10  Total Catecholamines     pg/mL 191 (L)  Metanephrine, Pl     <=57 pg/mL <25  Normetanephrine, Pl     <=148 pg/mL 38  Total Metanephrines-Plasma     <=205 pg/mL 38  ALDOSTERONE      ng/dL 2  Renin Activity     0.25 - 5.82 ng/mL/h 1.48  ALDO / PRA Ratio     0.9 - 28.9 Ratio 1.4  Potassium     3.5 - 5.1 mEq/L 4.9   Previously: Component     Latest Ref Rng & Units 11/30/2017  Epinephrine     pg/mL  undetectable  Norepinephrine     pg/mL 99  Dopamine     pg/mL  undetectable  Catecholamines, Total     pg/mL 99  Metanephrine, Pl     <=57 pg/mL <25  Normetanephrine, Pl     <=148 pg/mL 71  Total Metanephrines-Plasma     <=205 pg/mL 71  ALDOSTERONE      ng/dL 3  Renin Activity     0.25 - 5.82 ng/mL/h 1.20  ALDO / PRA Ratio     0.9 - 28.9 Ratio 2.5  Potassium     3.5 - 5.1 mEq/L 4.0   DM2: Reviewed latest HbA1c levels: Lab Results  Component Value  Date   HGBA1C 6.8 (A) 08/31/2018   HGBA1C 6.9 (H) 06/09/2018   HGBA1C 8.4 (H) 03/08/2018   Pt was on a regimen of: - Humalog 75/25 20-26 units before each meal, 2-3X a day depending on the CBG before the meal  - Levemir 100 units at bedtime  Then on: - Tresiba 40 units daily after dialysis - Humalog 20 units before a smaller meal - Humalog 32 units before a larger meal Please do not take Humalog if sugars before a meal are <60. Otherwise, take it whenever you eat.   At last visit, we changed to: - Tresiba 40 units after dialysis  - Humalog  18 units before a smaller meal 22 units before a larger meal Take Humalog EVERY TIME you eat, 15 min before the meal, if sugars are >60. - Humalog sliding scale: - 150-175: + 1 unit  - 176-200: + 2 units  - 201-225: + 3 units  - 226-250: + 4 units  - 251-275: + 5 units - 276-300: + 6 units  Pt checks her sugars 4 times a day per review of her log - am: 93-336 >> 64-322, 356 >> 68,  103-406, 530 >> 130, 144, 198-314 - 2h after b'fast: n/c >> 202, 296, 462 >> n/c >> 31, 95-373 >> n/c - before lunch: 31, 89-127 >> 55, 89-356, HI >> 68, 125-323 >> 151-344, 450 - 2h after lunch: n/c >> 178 >> 350 - before dinner: 280-436 >> 217, 300 >> 64-288, 340 >> n/c >> 218-367 - 2h after dinner: n/c - bedtime:422, 448 >> 167-242, 362 >> 90-207 >> n/c >> 209, 226 - nighttime: n/c Lowest sugar was 31 >> 130; it is unclear at which ever she has hypoglycemia awareness. Highest sugar was HI >> 530 >> 499  Glucometer: Accuchek  Pt's meals are: - Breakfast: eggs, bacon, oatmeal, grits - Lunch: salads - Dinner: meat + veggie or sandwich + salad or nabs - Snacks: nabs, fruit cups, jello  -+ End-stage renal disease, now on dialysis Lab Results  Component Value Date   BUN 21 12/29/2018   BUN 19 12/05/2018   CREATININE 4.56 (H) 12/29/2018   CREATININE 3.93 (H) 12/05/2018  Off olmesartan. -+ HL; last set of lipids: Lab Results  Component Value Date   CHOL 199 03/08/2018   HDL 37 (L) 03/08/2018   LDLCALC 114 (H) 03/08/2018   LDLDIRECT 107 05/19/2016   TRIG 242 (H) 03/08/2018   CHOLHDL 5.4 03/08/2018  Previously on Lipitor 40, now only on Zetia. - last eye exam was on 00/11/2018: Reportedly no DR - no numbness but she has tingling in her feet.  She is on Neurontin 300 mg TID.  She also has HTN.  ROS: Constitutional: + weight gain/no weight loss, no fatigue, no subjective hyperthermia, no subjective hypothermia Eyes: no blurry vision, no xerophthalmia ENT: no sore throat, no nodules palpated in neck, no dysphagia, no odynophagia, no hoarseness Cardiovascular: no CP/no SOB/no palpitations/+ leg swelling Respiratory: no cough/no SOB/no wheezing Gastrointestinal: no N/no V/no D/no C/no acid reflux Musculoskeletal: no muscle aches/no joint aches Skin: no rashes, no hair loss Neurological: no tremors/no numbness/no tingling/no dizziness  I reviewed pt's medications, allergies,  PMH, social hx, family hx, and changes were documented in the history of present illness. Otherwise, unchanged from my initial visit note.  Past Medical History:  Diagnosis Date  . Acid reflux   . Anemia   . Arthritis   . Axillary masses    Soft tissue -  status post excision  . Back pain   . Depression   . Diastolic heart failure ([place])   . End-stage renal disease ([place])    TTHSat  in [place]  . Essential hypertension   . History of blood transfusion   . History of claustrophobia   . History of pneumonia 2019  . Ischemic heart disease    Abnormal Myoview April 2018 - medical therapy  . Mixed hyperlipidemia   . Obesity   . Pancreatitis   . Peritoneal dialysis catheter in place [facility])   . Sleep apnea    Noncompliant with CPAP  . Type 2 diabetes mellitus ([place])    Past Surgical History:  Procedure Laterality Date  . ABDOMINAL HYSTERECTOMY    . AV FISTULA PLACEMENT Left 09/02/2017   Procedure: creation of left arm ARTERIOVENOUS (AV) FISTULA;  Surgeon: Serafina Mitchell, MD;  Location[facility] OR;  Service: Vascular;  Laterality: Left;  . COLONOSCOPY  2008   Dr. Oneida Alar: normal   . COLONOSCOPY N/A 12/18/2016   Dr. Oneida Alar: multiple tubular adenomas, internal hemorrhoids. Surveillance in 3 years   . ESOPHAGEAL DILATION N/A 10/13/2015   Procedure: ESOPHAGEAL DILATION;  Surgeon: Rogene Houston, MD;  Location: [facility];  Service: Endoscopy;  Laterality: N/A;  . ESOPHAGOGASTRODUODENOSCOPY N/A 10/13/2015   Dr. Laural Golden: chronic gastritis on path, no H.pylori. Empiric dilation   . ESOPHAGOGASTRODUODENOSCOPY N/A 12/18/2016   Dr. Oneida Alar: mild gastritis. BRAVO study revealed uncontrolled GERD. Dysphagia secondary to uncontrolled reflux  . FOOT SURGERY Bilateral    "nerve"    . LEFT HEART CATH AND CORONARY ANGIOGRAPHY N/A 12/29/2018   Procedure: LEFT HEART CATH AND CORONARY ANGIOGRAPHY;  Surgeon: Jettie Booze, MD;  Location: [place] LAB;  Service: Cardiovascular;  Laterality: N/A;   . LUNG BIOPSY    . MASS EXCISION Right 01/09/2013   Procedure: EXCISION OF NEOPLASM OF RIGHT  AXILLA  AND EXCISION OF NEOPLASM OF LEFT AXILLA;  Surgeon: Jamesetta So, MD;  Location: [facility] ORS;  Service: General;  Laterality: Right;  procedure end @ 08:23  . MYRINGOTOMY WITH TUBE PLACEMENT Bilateral 04/28/2017   Procedure: BILATERAL MYRINGOTOMY WITH TUBE PLACEMENT;  Surgeon: Leta Baptist, MD;  Location: [place];  Service: ENT;  Laterality: Bilateral;  . REVISION OF ARTERIOVENOUS GORETEX GRAFT Left 05/04/2018   Procedure: TRANSPOSITION OF CEPHALIC VEIN ARTERIOVENOUS FISTULA LEFT ARM;  Surgeon: Rosetta Posner, MD;  Location: [place];  Service: Vascular;  Laterality: Left;  . SAVORY DILATION N/A 12/18/2016   Procedure: SAVORY DILATION;  Surgeon: Danie Binder, MD;  [facility];  Service: Endoscopy;  Laterality: N/A;   Social History   Socioeconomic History  . Marital status: Married    Spouse name: Not on file  . Number of children: 2  Occupational History  . CNA  Tobacco Use  . Smoking status: Never Smoker  . Smokeless tobacco: Never Used  Substance and Sexual Activity  . Alcohol use: No  . Drug use: No   Current Outpatient Medications on File Prior to Visit  Medication Sig Dispense Refill  . acetaminophen (TYLENOL) 500 MG tablet Take 1,000 mg by mouth every 8 (eight) hours as needed for mild pain.     Marland Kitchen amLODipine (NORVASC) 10 MG tablet TAKE 1 TABLET BY MOUTH ONCE DAILY. (Patient taking differently: Take 10 mg by mouth daily. ) 28 tablet 10  . cloNIDine (CATAPRES) 0.3 MG tablet Take 1 tablet at 6 AM take 1/2 tablet at 2 PM and  take 1 tablet at 10 PM every day (Patient taking differently: Take 0.3 mg by mouth See admin instructions. Take 0.3 mg at 6 AM take 0.15 mg at 2 PM and take 0.3 mg at 10 PM every day) 75 tablet 5  . dexlansoprazole (DEXILANT) 60 MG capsule TAKE 1 CAPSULE IN THE MORNING WITH FOOD (Patient taking differently: Take 60 mg by mouth daily with breakfast. ) 28 capsule 10   . ezetimibe (ZETIA) 10 MG tablet TAKE 1 TABLET BY MOUTH ONCE DAILY. (Patient taking differently: Take 10 mg by mouth daily. ) 28 tablet 11  . furosemide (LASIX) 80 MG tablet Take 80 mg by mouth 2 (two) times daily.    Marland Kitchen gabapentin (NEURONTIN) 300 MG capsule TAKE (1) CAPSULE BY MOUTH THREE TIMES DAILY (Patient taking differently: Take 300 mg by mouth 3 (three) times daily. ) 84 capsule 3  . GNP ASPIRIN LOW DOSE 81 MG EC tablet TAKE 1 TABLET BY MOUTH ONCE DAILY. (Patient taking differently: Take 81 mg by mouth daily. ) 28 tablet 10  . HYDROcodone-acetaminophen (NORCO/VICODIN) 5-325 MG tablet Take 1 tablet by mouth daily as needed for moderate pain.    . Insulin Degludec (TRESIBA FLEXTOUCH) 200 UNIT/ML SOPN Inject 50 Units into the skin every evening.     . Insulin Lispro (HUMALOG KWIKPEN) 200 UNIT/ML SOPN Inject 18-22 Units into the skin 3 (three) times daily before meals. 18 units before a smaller meal and 22 units before a larger meal 30 mL 5  . isosorbide mononitrate (IMDUR) 30 MG 24 hr tablet TAKE 1 TABLET BY MOUTH ONCE DAILY. (Patient taking differently: Take 30 mg by mouth daily. ) 28 tablet 11  . metoprolol succinate (TOPROL-XL) 50 MG 24 hr tablet Take 50 mg by mouth every morning.    . ondansetron (ZOFRAN) 4 MG tablet Take 1 tablet (4 mg total) by mouth every 8 (eight) hours as needed for nausea or vomiting. 30 tablet 1  . sertraline (ZOLOFT) 50 MG tablet TAKE 1 TABLET BY MOUTH ONCE DAILY. (Patient taking differently: Take 50 mg by mouth daily. ) 28 tablet 4  . [name] 500 MG chewable tablet Chew 500 mg by mouth See admin instructions. Take 500 mg with each snack and meal    . zolpidem (AMBIEN) 10 MG tablet Take 10 mg by mouth at bedtime as needed for sleep.     . [DISCONTINUED] FLUoxetine (PROZAC) 10 MG capsule Take 10 mg by mouth daily.      . [DISCONTINUED] glipiZIDE (GLUCOTROL) 10 MG tablet Take 10 mg by mouth 2 (two) times daily before a meal.       No current facility-administered  medications on file prior to visit.    Allergies  Allergen Reactions  . Ace Inhibitors Anaphylaxis and Swelling  . Penicillins Itching, Swelling and Other (See Comments)    Did it involve swelling of the face/tongue/throat, SOB, or low BP? Unknown Did it involve sudden or severe rash/hives, skin peeling, or any reaction on the inside of your mouth or nose? Unknown Did you need to seek medical attention at a hospital or doctor's office? Unknown When did it last happen?years  If all above answers are "NO", may proceed with cephalosporin use.   . Statins Other (See Comments)    elevated LFT's    . Albuterol Swelling   Family History  Problem Relation Age of Onset  . Hypertension Father   . Hypercholesterolemia Father   . Arthritis Father   . Hypertension Sister   .  Hypercholesterolemia Sister   . Breast cancer Sister   . Hypertension Sister   . Colon cancer Neg Hx   . Colon polyps Neg Hx     PE: BP (!) 150/60   Pulse (!) 101   Ht 5\' 4"  (1.626 m)   Wt 229 lb (103.9 kg)   SpO2 95%   BMI 39.31 kg/m  Wt Readings from Last 3 Encounters:  01/31/19 229 lb (103.9 kg)  01/24/19 229 lb (103.9 kg)  01/02/19 192 lb (87.1 kg)   Constitutional: overweight, + facial and truncal swelling (appears in anasarca), in NAD Eyes: PERRLA, EOMI, no exophthalmos ENT: moist mucous membranes, no thyromegaly, no cervical lymphadenopathy Cardiovascular: Tachycardia, RR, + 1/6 SEM, No RG, + bilateral leg swelling, pitting Respiratory: CTA B Gastrointestinal: abdomen soft, NT, ND, BS+ Musculoskeletal: no deformities, strength intact in all 4 Skin: moist, warm, no rashes Neurological: no tremor with outstretched hands, DTR normal in all 4  ASSESSMENT: 1. DM2, insulin-dependent, uncontrolled, with complications - CKD stage 4 - PN  2. HL  3. B adrenal adenomas  PLAN:  1. Patient with longstanding, uncontrolled, type 2 diabetes, previously on a premixed insulin regimen and also  long-acting insulin, now on basal/bolus insulin regimen.  Her sugars improved significantly after she started dialysis but her sugars are still fluctuating. -At last visit, sugars are very variable, from 30s to 500s and upon questioning, she did not move [place] after dialysis.  Also, she was not taking Humalog with every meal only if sugars are higher than 160.  At that time, we discussed about moving [place] to after dialysis, decreasing Humalog doses but to take them with every meal and also I gave her a sliding scale. -Before last visit, the most recent HbA1c was slightly better, at 6.8%. -At this visit, her sugars are higher and this is possibly related to her significant fluid retention and changing dialysis parameters.  She started peritoneal dialysis 2 weeks ago and sugars are almost consistently elevated.  I would have suggested to start a weekly GLP-1 receptor agonist, however, upon questioning, she does have a history of pancreatitis, so this is contraindicated.  Therefore, the only option is to increase her insulin.  However, I advised her that the doses of insulin may need to be lowered after she loses the extra fluid. -We discussed about a CGM and I think she is a perfect candidate.  I sent a prescription for freestyle libre CGM to her pharmacy. - I suggested to:  Patient Instructions  Please increase: - Tresiba 50-56 units after dialysis - Humalog  24 units before a smaller meal 28 units before a larger meal Take Humalog EVERY TIME you eat, 15 min before the meal, if sugars are >60. - Humalog sliding scale: - 150-175: + 1 unit  - 176-200: + 2 units  - 201-225: + 3 units  - 226-250: + 4 units  - 251-275: + 5 units - 276-300: + 6 units  Please return in 3 months with your sugar log.   - we checked her HbA1c: 7.9% (higher) - advised to check sugars at different times of the day - 4x a day, rotating check times - advised for yearly eye exams >> she is UTD - return to clinic in 3  months   2. HL - Reviewed latest lipid panel from 02/2018: LDL above goal, iron, HDL low, triglycerides high Lab Results  Component Value Date   CHOL 199 03/08/2018   HDL 37 (L) 03/08/2018  LDLCALC 114 (H) 03/08/2018   LDLDIRECT 107 05/19/2016   TRIG 242 (H) 03/08/2018   CHOLHDL 5.4 03/08/2018  - Continues Zetia without side effects.  3.  Bilateral adrenal adenoma -Patient with history of bilateral adrenal adenomas per review of her abdominal MRI from 2019.  The imaging characteristics pointed towards benign tumors, of 2.5 and 1.6 cm, respectively.  However, they have appeared to increase in size over time.  Reviewing the MRI of the abdomen from 2010 and the CT of the abdomen and pelvis from 2017, only one nodule was seen in both, in the left adrenal. -We check a plasma catecholamines, metanephrines, and aldosterone and they were all normal in 11/2017, but her norepinephrine was slightly elevated, nonspecifically, and 09/2018.  We will repeat these in a year. -Of note, her dexamethasone suppression test was abnormal, but the cortisol was slightly high due to her end-stage renal disease and not actually increased cortisol production.  We cannot check a 24-hour urine cortisol as she is on dialysis.  We may need to late-night salivary cortisol in the future.  [name], MD PhD [facility]

## 2019-01-31 NOTE — Addendum Note (Signed)
Addended by: Cardell Peach I on: 01/31/2019 04:33 PM   Modules accepted: Orders

## 2019-01-31 NOTE — Patient Instructions (Addendum)
Please increase: - Tresiba 50-56 units after dialysis - Humalog  24 units before a smaller meal 28 units before a larger meal Take Humalog EVERY TIME you eat, 15 min before the meal, if sugars are >60. - Humalog sliding scale: - 150-175: + 1 unit  - 176-200: + 2 units  - 201-225: + 3 units  - 226-250: + 4 units  - 251-275: + 5 units - 276-300: + 6 units   Please return in 3 months with your sugar log.

## 2019-02-01 ENCOUNTER — Telehealth: Payer: Self-pay

## 2019-02-01 NOTE — Telephone Encounter (Signed)
Noted  

## 2019-02-01 NOTE — Telephone Encounter (Signed)
We cannot do it anyway, as she has pancreatitis.  I sent it by mistake to her pharmacy yesterday, before I found out that she had repeated episodes of pancreatitis.  I told her to let the pharmacy know that she will not be starting it.

## 2019-02-01 NOTE — Telephone Encounter (Signed)
Ozempic is not covered please advise.

## 2019-02-03 ENCOUNTER — Other Ambulatory Visit: Payer: Self-pay

## 2019-02-03 MED ORDER — TRESIBA FLEXTOUCH 200 UNIT/ML ~~LOC~~ SOPN: 50.0000 [IU] | PEN_INJECTOR | SUBCUTANEOUS | 2 refills | Status: DC

## 2019-02-09 ENCOUNTER — Other Ambulatory Visit: Payer: Self-pay | Admitting: Family Medicine

## 2019-02-14 ENCOUNTER — Telehealth: Payer: Self-pay | Admitting: *Deleted

## 2019-02-14 NOTE — Telephone Encounter (Signed)
Pt had flu shot at dialysis about a month ago

## 2019-02-14 NOTE — Telephone Encounter (Signed)
Chart updated

## 2019-02-15 ENCOUNTER — Ambulatory Visit: Payer: 59

## 2019-02-20 ENCOUNTER — Other Ambulatory Visit: Payer: Self-pay | Admitting: Nephrology

## 2019-02-20 ENCOUNTER — Other Ambulatory Visit: Payer: Self-pay

## 2019-02-20 ENCOUNTER — Ambulatory Visit (HOSPITAL_COMMUNITY)
Admission: RE | Admit: 2019-02-20 | Discharge: 2019-02-20 | Disposition: A | Discharge status: Home / Self Care | Payer: 59 | Source: Ambulatory Visit | Attending: Nephrology | Admitting: Nephrology

## 2019-02-20 DIAGNOSIS — N186 End stage renal disease: Secondary | ICD-10-CM

## 2019-02-20 DIAGNOSIS — Z992 Dependence on renal dialysis: Secondary | ICD-10-CM | POA: Insufficient documentation

## 2019-02-20 NOTE — Progress Notes (Signed)
PD cathter slow draining post fall at home Abdominal X ray ordered to evaluate for position Referred to surgeon for evaluation

## 2019-02-22 ENCOUNTER — Ambulatory Visit: Payer: 59 | Admitting: Family Medicine

## 2019-02-23 ENCOUNTER — Inpatient Hospital Stay (HOSPITAL_COMMUNITY): Payer: 59 | Attending: Hematology | Admitting: Hematology

## 2019-02-23 ENCOUNTER — Other Ambulatory Visit: Payer: Self-pay

## 2019-02-23 ENCOUNTER — Inpatient Hospital Stay (HOSPITAL_COMMUNITY): Payer: 59

## 2019-02-23 VITALS — BP 158/63 | HR 74 | Temp 97.6°F | Resp 20 | Wt 227.2 lb

## 2019-02-23 DIAGNOSIS — K219 Gastro-esophageal reflux disease without esophagitis: Secondary | ICD-10-CM | POA: Diagnosis not present

## 2019-02-23 DIAGNOSIS — Z803 Family history of malignant neoplasm of breast: Secondary | ICD-10-CM | POA: Insufficient documentation

## 2019-02-23 DIAGNOSIS — Z992 Dependence on renal dialysis: Secondary | ICD-10-CM | POA: Insufficient documentation

## 2019-02-23 DIAGNOSIS — Z79899 Other long term (current) drug therapy: Secondary | ICD-10-CM | POA: Diagnosis not present

## 2019-02-23 DIAGNOSIS — D509 Iron deficiency anemia, unspecified: Secondary | ICD-10-CM | POA: Insufficient documentation

## 2019-02-23 DIAGNOSIS — R5383 Other fatigue: Secondary | ICD-10-CM | POA: Diagnosis not present

## 2019-02-23 DIAGNOSIS — E669 Obesity, unspecified: Secondary | ICD-10-CM | POA: Insufficient documentation

## 2019-02-23 DIAGNOSIS — Z800 Family history of malignant neoplasm of digestive organs: Secondary | ICD-10-CM | POA: Diagnosis not present

## 2019-02-23 DIAGNOSIS — D638 Anemia in other chronic diseases classified elsewhere: Secondary | ICD-10-CM

## 2019-02-23 DIAGNOSIS — E782 Mixed hyperlipidemia: Secondary | ICD-10-CM | POA: Diagnosis not present

## 2019-02-23 DIAGNOSIS — Z794 Long term (current) use of insulin: Secondary | ICD-10-CM | POA: Diagnosis not present

## 2019-02-23 DIAGNOSIS — D472 Monoclonal gammopathy: Secondary | ICD-10-CM

## 2019-02-23 DIAGNOSIS — F329 Major depressive disorder, single episode, unspecified: Secondary | ICD-10-CM | POA: Diagnosis not present

## 2019-02-23 DIAGNOSIS — I5032 Chronic diastolic (congestive) heart failure: Secondary | ICD-10-CM | POA: Diagnosis not present

## 2019-02-23 DIAGNOSIS — M129 Arthropathy, unspecified: Secondary | ICD-10-CM | POA: Diagnosis not present

## 2019-02-23 DIAGNOSIS — I132 Hypertensive heart and chronic kidney disease with heart failure and with stage 5 chronic kidney disease, or end stage renal disease: Secondary | ICD-10-CM | POA: Insufficient documentation

## 2019-02-23 DIAGNOSIS — G4730 Sleep apnea, unspecified: Secondary | ICD-10-CM | POA: Insufficient documentation

## 2019-02-23 DIAGNOSIS — E1122 Type 2 diabetes mellitus with diabetic chronic kidney disease: Secondary | ICD-10-CM | POA: Insufficient documentation

## 2019-02-23 DIAGNOSIS — N184 Chronic kidney disease, stage 4 (severe): Secondary | ICD-10-CM

## 2019-02-23 LAB — CBC WITH DIFFERENTIAL/PLATELET
Abs Immature Granulocytes: 0.08 10*3/uL — ABNORMAL HIGH (ref 0.00–0.07)
Basophils Absolute: 0 10*3/uL (ref 0.0–0.1)
Basophils Relative: 0 %
Eosinophils Absolute: 0.1 10*3/uL (ref 0.0–0.5)
Eosinophils Relative: 2 %
HCT: 30.1 % — ABNORMAL LOW (ref 36.0–46.0)
Hemoglobin: 8.7 g/dL — ABNORMAL LOW (ref 12.0–15.0)
Immature Granulocytes: 1 %
Lymphocytes Relative: 39 %
Lymphs Abs: 2.7 10*3/uL (ref 0.7–4.0)
MCH: 23.4 pg — ABNORMAL LOW (ref 26.0–34.0)
MCHC: 28.9 g/dL — ABNORMAL LOW (ref 30.0–36.0)
MCV: 80.9 fL (ref 80.0–100.0)
Monocytes Absolute: 0.6 10*3/uL (ref 0.1–1.0)
Monocytes Relative: 8 %
Neutro Abs: 3.6 10*3/uL (ref 1.7–7.7)
Neutrophils Relative %: 50 %
Platelets: 315 10*3/uL (ref 150–400)
RBC: 3.72 MIL/uL — ABNORMAL LOW (ref 3.87–5.11)
RDW: 19.2 % — ABNORMAL HIGH (ref 11.5–15.5)
WBC: 7.1 10*3/uL (ref 4.0–10.5)
nRBC: 0.4 % — ABNORMAL HIGH (ref 0.0–0.2)

## 2019-02-23 LAB — COMPREHENSIVE METABOLIC PANEL
ALT: 22 U/L (ref 0–44)
AST: 21 U/L (ref 15–41)
Albumin: 3.5 g/dL (ref 3.5–5.0)
Alkaline Phosphatase: 143 U/L — ABNORMAL HIGH (ref 38–126)
Anion gap: 16 — ABNORMAL HIGH (ref 5–15)
BUN: 31 mg/dL — ABNORMAL HIGH (ref 8–23)
CO2: 24 mmol/L (ref 22–32)
Calcium: 9 mg/dL (ref 8.9–10.3)
Chloride: 100 mmol/L (ref 98–111)
Creatinine, Ser: 4.06 mg/dL — ABNORMAL HIGH (ref 0.44–1.00)
GFR calc Af Amer: 13 mL/min — ABNORMAL LOW (ref >60)
GFR calc non Af Amer: 11 mL/min — ABNORMAL LOW (ref >60)
Glucose, Bld: 63 mg/dL — ABNORMAL LOW (ref 70–99)
Potassium: 3.1 mmol/L — ABNORMAL LOW (ref 3.5–5.1)
Sodium: 140 mmol/L (ref 135–145)
Total Bilirubin: 0.3 mg/dL (ref 0.3–1.2)
Total Protein: 8 g/dL (ref 6.5–8.1)

## 2019-02-23 LAB — IRON AND TIBC
Iron: 42 ug/dL (ref 28–170)
Saturation Ratios: 15 % (ref 10.4–31.8)
TIBC: 278 ug/dL (ref 250–450)
UIBC: 236 ug/dL

## 2019-02-23 LAB — FOLATE: Folate: 8.1 ng/mL (ref >5.9)

## 2019-02-23 LAB — FERRITIN: Ferritin: 404 ng/mL — ABNORMAL HIGH (ref 11–307)

## 2019-02-23 LAB — LACTATE DEHYDROGENASE: LDH: 287 U/L — ABNORMAL HIGH (ref 98–192)

## 2019-02-23 LAB — VITAMIN B12: Vitamin B-12: 331 pg/mL (ref 180–914)

## 2019-02-23 NOTE — Assessment & Plan Note (Signed)
1.  Monoclonal gammopathy of uncertain significance: - Patient with history of CKD stage IV, work-up for monoclonal gammopathy on 08/09/2017 showed an M spike of 0.1 g/dL of IgG kappa monoclonal protein.  Free kappa light chains were elevated at 171 and lambda light chains 75.7.  Ratio was 2.27.  24-hour urine showed nephrotic range proteinuria.  This was 18 g.  -Skeletal survey on 11/05/2017 was negative for lytic lesions -Most recent myeloma labs are from October 2019, with kappa light chain elevated at 175.5 mg/L, lambda light chain 75.4, and ratio of 2.33.  Protein electro pheresis significant for M spike of 0.3 g/dL, IFE with faint band in the gamma region suspicious for monoclonal immunoglobulin. -Have recommended patient proceed with bone marrow biopsy with aspiration as this was not done at time of her initial diagnosis of MGUS.  Also, because patient is considering kidney transplant we will need to rule out multiple myeloma and help her achieve a remission so she can be a candidate for transplant. Further recommendation is to repeat skeletal survey.  Multiple myeloma labs are pending for today. -She will return to clinic 1 week after bone marrow biopsy.   2.  CKD: Thought to be secondary to long history of diabetes, hypertension, obesity related glomerulopathy. -She follows up with nephrology for her kidney disease.   3.  Microcytic anemia: - She did receive 2 units of PRBC during hospitalization in June.  She also received Feraheme at that time. -We give her him in our office on 11/29/2017.  She was started on weekly Procrit 20,000 units on 11/26/2017.  Hemoglobin traverse the end of September has improved and been staying. -She is requiring Procrit once every 2 weeks. -

## 2019-02-23 NOTE — Progress Notes (Signed)
Kathryn Beck, [place] [id number]   CLINIC:  Medical Oncology/Hematology  PCP:  Fayrene [facility], MD [address], [address] [name] [address] [phone]   REASON FOR VISIT:  Follow-up for MGUS  CURRENT THERAPY: Clinical surveillance    INTERVAL HISTORY:  Kathryn Beck 63 y.o. female presents today for follow-up.  She reports overall doing well.  She is currently on peritoneal dialysis.  She states she met with kidney transplant team.  She is currently in process to see if she is a candidate for a transplant.  She denies any other changes to her health history since her last visit.  No obvious signs of bleeding.  No new bone pain.  Denies any fevers, chills, night sweats.  She is here for repeat labs and office visit.    REVIEW OF SYSTEMS:  Review of Systems  Constitutional: Positive for fatigue.  HENT:  Negative.   Eyes: Negative.   Respiratory: Negative.   Cardiovascular: Negative.   Gastrointestinal: Negative.   Endocrine: Negative.   Genitourinary: Negative.    Musculoskeletal: Positive for arthralgias, back pain and gait problem.  Skin: Negative.   Neurological: Positive for extremity weakness and gait problem.  Hematological: Negative.   Psychiatric/Behavioral: Negative.      PAST MEDICAL/SURGICAL HISTORY:  Past Medical History:  Diagnosis Date   Acid reflux    Anemia    Arthritis    Axillary masses    Soft tissue - status post excision   Back pain    Depression    Diastolic heart failure (HCC)    End-stage renal disease (HCC)    TTHSat  in [place]   Essential hypertension    History of blood transfusion    History of claustrophobia    History of pneumonia 2019   Ischemic heart disease    Abnormal Myoview April 2018 - medical therapy   Mixed hyperlipidemia    Obesity    Pancreatitis    Peritoneal dialysis catheter in place [facility])    Sleep apnea    Noncompliant with CPAP   Type 2  diabetes mellitus ([place])    Past Surgical History:  Procedure Laterality Date   ABDOMINAL HYSTERECTOMY     AV FISTULA PLACEMENT Left 09/02/2017   Procedure: creation of left arm ARTERIOVENOUS (AV) FISTULA;  Surgeon: Serafina Mitchell, MD;  Location: [facility];  Service: Vascular;  Laterality: Left;   COLONOSCOPY  2008   Dr. Oneida Alar: normal    COLONOSCOPY N/A 12/18/2016   Dr. Oneida Alar: multiple tubular adenomas, internal hemorrhoids. Surveillance in 3 years    ESOPHAGEAL DILATION N/A 10/13/2015   Procedure: ESOPHAGEAL DILATION;  Surgeon: Rogene Houston, MD;  [facility];  Service: Endoscopy;  Laterality: N/A;   ESOPHAGOGASTRODUODENOSCOPY N/A 10/13/2015   Dr. Laural Golden: chronic gastritis on path, no H.pylori. Empiric dilation    ESOPHAGOGASTRODUODENOSCOPY N/A 12/18/2016   Dr. Oneida Alar: mild gastritis. BRAVO study revealed uncontrolled GERD. Dysphagia secondary to uncontrolled reflux   FOOT SURGERY Bilateral    "nerve"     LEFT HEART CATH AND CORONARY ANGIOGRAPHY N/A 12/29/2018   Procedure: LEFT HEART CATH AND CORONARY ANGIOGRAPHY;  Surgeon: Jettie Booze, MD;  Location: [facility];  Service: Cardiovascular;  Laterality: N/A;   LUNG BIOPSY     MASS EXCISION Right 01/09/2013   Procedure: EXCISION OF NEOPLASM OF RIGHT  AXILLA  AND EXCISION OF NEOPLASM OF LEFT AXILLA;  Surgeon: Jamesetta So, MD;  Location:  [facility];  Service: General;  Laterality: Right;  procedure end @ 08:23   MYRINGOTOMY WITH TUBE PLACEMENT Bilateral 04/28/2017   Procedure: BILATERAL MYRINGOTOMY WITH TUBE PLACEMENT;  Surgeon: Leta Baptist, MD;  Location: [facility];  Service: ENT;  Laterality: Bilateral;   REVISION OF ARTERIOVENOUS GORETEX GRAFT Left 05/04/2018   Procedure: TRANSPOSITION OF CEPHALIC VEIN ARTERIOVENOUS FISTULA LEFT ARM;  Surgeon: Rosetta Posner, MD;  Location: [facility];  Service: Vascular;  Laterality: Left;   SAVORY DILATION N/A 12/18/2016   Procedure: SAVORY DILATION;  Surgeon: Danie Binder, MD;   [facility];  Service: Endoscopy;  Laterality: N/A;     SOCIAL HISTORY:  Social History   Socioeconomic History   Marital status: Married    Spouse name: larry    Number of children: 2   Years of education: 12   Highest education level: 12th grade  Occupational History   Occupation: retired   Scientist, product/process development strain: Somewhat hard   Food insecurity    Worry: Never true    Inability: Never true   Transportation needs    Medical: No    Non-medical: No  Tobacco Use   Smoking status: Never Smoker   Smokeless tobacco: Never Used  Substance and Sexual Activity   Alcohol use: No   Drug use: No   Sexual activity: Not Currently  Lifestyle   Physical activity    Days per week: 0 days    Minutes per session: 0 min   Stress: Only a little  Relationships   Social connections    Talks on phone: More than three times a week    Gets together: More than three times a week    Attends religious service: Never    Active member of club or organization: No    Attends meetings of clubs or organizations: Never    Relationship status: Married   Intimate partner violence    Fear of current or ex partner: No    Emotionally abused: No    Physically abused: No    Forced sexual activity: No  Other Topics Concern   Not on file  Social History Narrative   Lives alone with husband     FAMILY HISTORY:  Family History  Problem Relation Age of Onset   Hypertension Father    Hypercholesterolemia Father    Arthritis Father    Hypertension Sister    Hypercholesterolemia Sister    Breast cancer Sister    Hypertension Sister    Colon cancer Neg Hx    Colon polyps Neg Hx     CURRENT MEDICATIONS:  Outpatient Encounter Medications as of 02/23/2019  Medication Sig Note   amLODipine (NORVASC) 10 MG tablet TAKE 1 TABLET BY MOUTH ONCE DAILY. (Patient taking differently: Take 10 mg by mouth daily. )    cloNIDine (CATAPRES) 0.3 MG tablet  Take 1 tablet at 6 AM take 1/2 tablet at 2 PM and take 1 tablet at 10 PM every day (Patient taking differently: Take 0.3 mg by mouth See admin instructions. Take 0.3 mg at 6 AM take 0.15 mg at 2 PM and take 0.3 mg at 10 PM every day)    Continuous Blood Gluc Sensor (FREESTYLE LIBRE 14 DAY SENSOR) MISC 1 each by Does not apply route every 14 (fourteen) days. Change every 2 weeks    dexlansoprazole (DEXILANT) 60 MG capsule TAKE 1 CAPSULE IN THE MORNING WITH FOOD    diclofenac Sodium (VOLTAREN) 1 % GEL  Apply topically.    ezetimibe (ZETIA) 10 MG tablet TAKE 1 TABLET BY MOUTH ONCE DAILY. (Patient taking differently: Take 10 mg by mouth daily. )    furosemide (LASIX) 80 MG tablet Take 80 mg by mouth 2 (two) times daily.    gabapentin (NEURONTIN) 300 MG capsule TAKE (1) CAPSULE BY MOUTH THREE TIMES DAILY (Patient taking differently: Take 300 mg by mouth 3 (three) times daily. )    GNP ASPIRIN LOW DOSE 81 MG EC tablet TAKE 1 TABLET BY MOUTH ONCE DAILY. (Patient taking differently: Take 81 mg by mouth daily. )    Insulin Degludec (TRESIBA FLEXTOUCH) 200 UNIT/ML SOPN Inject 50-56 Units into the skin See admin instructions. For use after dialysis    Insulin Lispro (HUMALOG KWIKPEN) 200 UNIT/ML SOPN Inject 24-28 Units into the skin 3 (three) times daily before meals.    isosorbide mononitrate (IMDUR) 30 MG 24 hr tablet TAKE 1 TABLET BY MOUTH ONCE DAILY. (Patient taking differently: Take 30 mg by mouth daily. )    metoprolol succinate (TOPROL-XL) 50 MG 24 hr tablet Take 50 mg by mouth every morning.    sertraline (ZOLOFT) 50 MG tablet TAKE 1 TABLET BY MOUTH ONCE DAILY. (Patient taking differently: Take 50 mg by mouth daily. )    [name] 500 MG chewable tablet Chew 500 mg by mouth See admin instructions. Take 500 mg with each snack and meal    zolpidem (AMBIEN) 10 MG tablet Take 10 mg by mouth at bedtime as needed for sleep.     acetaminophen (TYLENOL) 500 MG tablet Take 1,000 mg by mouth every 8  (eight) hours as needed for mild pain.     fluorometholone (FML) 0.1 % ophthalmic suspension     HYDROcodone-acetaminophen (NORCO/VICODIN) 5-325 MG tablet Take 1 tablet by mouth daily as needed for moderate pain.    ondansetron (ZOFRAN) 4 MG tablet Take 1 tablet (4 mg total) by mouth every 8 (eight) hours as needed for nausea or vomiting. (Patient not taking: Reported on 02/23/2019) 12/05/2018: Patient states that she was given this medication IV today at dialysis   [DISCONTINUED] FLUoxetine (PROZAC) 10 MG capsule Take 10 mg by mouth daily.      [DISCONTINUED] glipiZIDE (GLUCOTROL) 10 MG tablet Take 10 mg by mouth 2 (two) times daily before a meal.      No facility-administered encounter medications on file as of 02/23/2019.     ALLERGIES:  Allergies  Allergen Reactions   Ace Inhibitors Anaphylaxis and Swelling   Penicillins Itching, Swelling and Other (See Comments)    Did it involve swelling of the face/tongue/throat, SOB, or low BP? Unknown Did it involve sudden or severe rash/hives, skin peeling, or any reaction on the inside of your mouth or nose? Unknown Did you need to seek medical attention at a hospital or doctor's office? Unknown When did it last happen?years  If all above answers are NO, may proceed with cephalosporin use.    Statins Other (See Comments)    elevated LFT's     Albuterol Swelling     PHYSICAL EXAM:  ECOG Performance status: 1  Vitals:   02/23/19 1052  BP: (!) 158/63  Pulse: 74  Resp: 20  Temp: 97.6 F (36.4 C)  SpO2: 100%   Filed Weights   02/23/19 1052  Weight: 227 lb 3.2 oz (103.1 kg)    Physical Exam Constitutional:      Appearance: Normal appearance.  HENT:     Head: Normocephalic.     Right  Ear: External ear normal.     Left Ear: External ear normal.     Nose: Nose normal.  Eyes:     Conjunctiva/sclera: Conjunctivae normal.  Neck:     Musculoskeletal: Normal range of motion.  Cardiovascular:     Rate and Rhythm:  Normal rate and regular rhythm.     Pulses: Normal pulses.     Heart sounds: Normal heart sounds.  Pulmonary:     Effort: Pulmonary effort is normal.     Breath sounds: Stridor present.  Abdominal:     General: Bowel sounds are normal.  Musculoskeletal: Normal range of motion.  Skin:    General: Skin is warm.  Neurological:     General: No focal deficit present.     Mental Status: She is alert and oriented to person, place, and time.  Psychiatric:        Mood and Affect: Mood normal.        Behavior: Behavior normal.      LABORATORY DATA:  I have reviewed the labs as listed.  CBC    Component Value Date/Time   WBC 7.1 02/23/2019 1020   RBC 3.72 (L) 02/23/2019 1020   HGB 8.7 (L) 02/23/2019 1020   HCT 30.1 (L) 02/23/2019 1020   PLT 315 02/23/2019 1020   MCV 80.9 02/23/2019 1020   MCH 23.4 (L) 02/23/2019 1020   MCHC 28.9 (L) 02/23/2019 1020   RDW 19.2 (H) 02/23/2019 1020   LYMPHSABS 2.7 02/23/2019 1020   MONOABS 0.6 02/23/2019 1020   EOSABS 0.1 02/23/2019 1020   BASOSABS 0.0 02/23/2019 1020   CMP Latest Ref Rng & Units 02/23/2019 12/29/2018 12/05/2018  Glucose 70 - 99 mg/dL 63(L) 189(H) 124(H)  BUN 8 - 23 mg/dL 31(H) 21 19  Creatinine 0.44 - 1.00 mg/dL 4.06(H) 4.56(H) 3.93(H)  Sodium 135 - 145 mmol/L 140 140 137  Potassium 3.5 - 5.1 mmol/L 3.1(L) 4.0 3.1(L)  Chloride 98 - 111 mmol/L 100 101 100  CO2 22 - 32 mmol/L _0 Calcium 8.9 - 10.3 mg/dL 9.0 9.6 8.9  Total Protein 6.5 - 8.1 g/dL 8.0 - -  Total Bilirubin 0.3 - 1.2 mg/dL 0.3 - -  Alkaline Phos 38 - 126 U/L 143(H) - -  AST 15 - 41 U/L 21 - -  ALT 0 - 44 U/L 22 - -         ASSESSMENT & PLAN:   MGUS (monoclonal gammopathy of unknown significance) 1.  Monoclonal gammopathy of uncertain significance: - Patient with history of CKD stage IV, work-up for monoclonal gammopathy on 08/09/2017 showed an M spike of 0.1 g/dL of IgG kappa monoclonal protein.  Free kappa light chains were elevated at 171 and lambda  light chains 75.7.  Ratio was 2.27.  24-hour urine showed nephrotic range proteinuria.  This was 18 g.  -Skeletal survey on 11/05/2017 was negative for lytic lesions -Most recent myeloma labs are from October 2019, with kappa light chain elevated at 175.5 mg/L, lambda light chain 75.4, and ratio of 2.33.  Protein electro pheresis significant for M spike of 0.3 g/dL, IFE with faint band in the gamma region suspicious for monoclonal immunoglobulin. -Have recommended patient proceed with bone marrow biopsy with aspiration as this was not done at time of her initial diagnosis of MGUS.  Also, because patient is considering kidney transplant we will need to rule out multiple myeloma and help her achieve a remission so she can be a candidate for transplant. Further recommendation  is to repeat skeletal survey.  Multiple myeloma labs are pending for today. -She will return to clinic 1 week after bone marrow biopsy.   2.  CKD: Thought to be secondary to long history of diabetes, hypertension, obesity related glomerulopathy. -She follows up with nephrology for her kidney disease.   3.  Microcytic anemia: - She did receive 2 units of PRBC during hospitalization in June.  She also received Feraheme at that time. -We give her him in our office on 11/29/2017.  She was started on weekly Procrit 20,000 units on 11/26/2017.  Hemoglobin traverse the end of September has improved and been staying. -She is requiring Procrit once every 2 weeks. -      Orders placed this encounter:  Orders Placed This Encounter  Procedures   CT Biopsy   CT BONE MARROW [place] [phone]

## 2019-02-24 ENCOUNTER — Other Ambulatory Visit (HOSPITAL_COMMUNITY): Payer: Self-pay | Admitting: Hematology

## 2019-02-24 ENCOUNTER — Ambulatory Visit (HOSPITAL_COMMUNITY)
Admission: RE | Admit: 2019-02-24 | Discharge: 2019-02-24 | Disposition: A | Discharge status: Home / Self Care | Payer: 59 | Source: Ambulatory Visit | Attending: Hematology | Admitting: Hematology

## 2019-02-24 ENCOUNTER — Other Ambulatory Visit: Payer: Self-pay

## 2019-02-24 DIAGNOSIS — Z20822 Contact with and (suspected) exposure to covid-19: Secondary | ICD-10-CM

## 2019-02-24 DIAGNOSIS — D472 Monoclonal gammopathy: Secondary | ICD-10-CM

## 2019-02-24 LAB — KAPPA/LAMBDA LIGHT CHAINS
Kappa free light chain: 240.7 mg/L — ABNORMAL HIGH (ref 3.3–19.4)
Kappa, lambda light chain ratio: 1.79 — ABNORMAL HIGH (ref 0.26–1.65)
Lambda free light chains: 134.1 mg/L — ABNORMAL HIGH (ref 5.7–26.3)

## 2019-02-26 LAB — IMMUNOFIXATION ELECTROPHORESIS
IgA: 506 mg/dL — ABNORMAL HIGH (ref 87–352)
IgG (Immunoglobin G), Serum: 1367 mg/dL (ref 586–1602)
IgM (Immunoglobulin M), Srm: 84 mg/dL (ref 26–217)
Total Protein ELP: 7.3 g/dL (ref 6.0–8.5)

## 2019-02-26 LAB — NOVEL CORONAVIRUS, NAA: SARS-CoV-2, NAA: NOT DETECTED

## 2019-03-01 ENCOUNTER — Telehealth: Payer: Self-pay

## 2019-03-01 NOTE — Telephone Encounter (Signed)
Amy from [facility] needs result faxed over for patient to have surgery at [phone]. Ph: [phone]

## 2019-03-02 ENCOUNTER — Other Ambulatory Visit: Payer: Self-pay | Admitting: Physician Assistant

## 2019-03-02 ENCOUNTER — Other Ambulatory Visit: Payer: Self-pay | Admitting: Radiology

## 2019-03-02 ENCOUNTER — Ambulatory Visit (HOSPITAL_COMMUNITY): Payer: 59

## 2019-03-02 NOTE — Addendum Note (Signed)
Addended by: Candiss Norse A on: 03/02/2019 11:09 AM   Modules accepted: Orders

## 2019-03-03 ENCOUNTER — Ambulatory Visit (HOSPITAL_COMMUNITY)
Admission: RE | Admit: 2019-03-03 | Discharge: 2019-03-03 | Disposition: A | Discharge status: Home / Self Care | Payer: 59 | Source: Ambulatory Visit | Attending: Family Medicine | Admitting: Family Medicine

## 2019-03-03 ENCOUNTER — Other Ambulatory Visit: Payer: Self-pay

## 2019-03-03 ENCOUNTER — Ambulatory Visit (HOSPITAL_COMMUNITY)
Admission: RE | Admit: 2019-03-03 | Discharge: 2019-03-03 | Disposition: A | Discharge status: Home / Self Care | Payer: 59 | Source: Ambulatory Visit | Attending: Hematology | Admitting: Hematology

## 2019-03-03 ENCOUNTER — Encounter (HOSPITAL_COMMUNITY): Payer: Self-pay

## 2019-03-03 DIAGNOSIS — Z791 Long term (current) use of non-steroidal anti-inflammatories (NSAID): Secondary | ICD-10-CM | POA: Insufficient documentation

## 2019-03-03 DIAGNOSIS — M1990 Unspecified osteoarthritis, unspecified site: Secondary | ICD-10-CM | POA: Diagnosis not present

## 2019-03-03 DIAGNOSIS — Z794 Long term (current) use of insulin: Secondary | ICD-10-CM | POA: Diagnosis not present

## 2019-03-03 DIAGNOSIS — Z79899 Other long term (current) drug therapy: Secondary | ICD-10-CM | POA: Diagnosis not present

## 2019-03-03 DIAGNOSIS — N186 End stage renal disease: Secondary | ICD-10-CM | POA: Insufficient documentation

## 2019-03-03 DIAGNOSIS — I5030 Unspecified diastolic (congestive) heart failure: Secondary | ICD-10-CM | POA: Insufficient documentation

## 2019-03-03 DIAGNOSIS — I132 Hypertensive heart and chronic kidney disease with heart failure and with stage 5 chronic kidney disease, or end stage renal disease: Secondary | ICD-10-CM | POA: Diagnosis not present

## 2019-03-03 DIAGNOSIS — K219 Gastro-esophageal reflux disease without esophagitis: Secondary | ICD-10-CM | POA: Diagnosis not present

## 2019-03-03 DIAGNOSIS — G4730 Sleep apnea, unspecified: Secondary | ICD-10-CM | POA: Insufficient documentation

## 2019-03-03 DIAGNOSIS — Z7982 Long term (current) use of aspirin: Secondary | ICD-10-CM | POA: Insufficient documentation

## 2019-03-03 DIAGNOSIS — Z803 Family history of malignant neoplasm of breast: Secondary | ICD-10-CM | POA: Insufficient documentation

## 2019-03-03 DIAGNOSIS — Z8249 Family history of ischemic heart disease and other diseases of the circulatory system: Secondary | ICD-10-CM | POA: Insufficient documentation

## 2019-03-03 DIAGNOSIS — D472 Monoclonal gammopathy: Secondary | ICD-10-CM

## 2019-03-03 DIAGNOSIS — F40240 Claustrophobia: Secondary | ICD-10-CM | POA: Insufficient documentation

## 2019-03-03 DIAGNOSIS — Z6839 Body mass index (BMI) 39.0-39.9, adult: Secondary | ICD-10-CM | POA: Diagnosis not present

## 2019-03-03 DIAGNOSIS — Z90710 Acquired absence of both cervix and uterus: Secondary | ICD-10-CM | POA: Diagnosis not present

## 2019-03-03 DIAGNOSIS — Z880 Allergy status to penicillin: Secondary | ICD-10-CM | POA: Insufficient documentation

## 2019-03-03 DIAGNOSIS — C9000 Multiple myeloma not having achieved remission: Secondary | ICD-10-CM | POA: Diagnosis not present

## 2019-03-03 DIAGNOSIS — Z888 Allergy status to other drugs, medicaments and biological substances status: Secondary | ICD-10-CM | POA: Diagnosis not present

## 2019-03-03 DIAGNOSIS — Z8349 Family history of other endocrine, nutritional and metabolic diseases: Secondary | ICD-10-CM | POA: Insufficient documentation

## 2019-03-03 DIAGNOSIS — E1122 Type 2 diabetes mellitus with diabetic chronic kidney disease: Secondary | ICD-10-CM | POA: Insufficient documentation

## 2019-03-03 DIAGNOSIS — Z9119 Patient's noncompliance with other medical treatment and regimen: Secondary | ICD-10-CM | POA: Insufficient documentation

## 2019-03-03 DIAGNOSIS — E782 Mixed hyperlipidemia: Secondary | ICD-10-CM | POA: Diagnosis not present

## 2019-03-03 DIAGNOSIS — Z8261 Family history of arthritis: Secondary | ICD-10-CM | POA: Diagnosis not present

## 2019-03-03 DIAGNOSIS — E669 Obesity, unspecified: Secondary | ICD-10-CM | POA: Insufficient documentation

## 2019-03-03 DIAGNOSIS — F329 Major depressive disorder, single episode, unspecified: Secondary | ICD-10-CM | POA: Diagnosis not present

## 2019-03-03 LAB — GLUCOSE, CAPILLARY
Glucose-Capillary: 403 mg/dL — ABNORMAL HIGH (ref 70–99)
Glucose-Capillary: 367 mg/dL — ABNORMAL HIGH (ref 70–99)
Glucose-Capillary: 353 mg/dL — ABNORMAL HIGH (ref 70–99)
Glucose-Capillary: 271 mg/dL — ABNORMAL HIGH (ref 70–99)

## 2019-03-03 LAB — CBC WITH DIFFERENTIAL/PLATELET
Abs Immature Granulocytes: 0.04 10*3/uL (ref 0.00–0.07)
Basophils Absolute: 0 10*3/uL (ref 0.0–0.1)
Basophils Relative: 1 %
Eosinophils Absolute: 0.1 10*3/uL (ref 0.0–0.5)
Eosinophils Relative: 1 %
HCT: 30.5 % — ABNORMAL LOW (ref 36.0–46.0)
Hemoglobin: 9 g/dL — ABNORMAL LOW (ref 12.0–15.0)
Immature Granulocytes: 1 %
Lymphocytes Relative: 22 %
Lymphs Abs: 1.8 10*3/uL (ref 0.7–4.0)
MCH: 23.7 pg — ABNORMAL LOW (ref 26.0–34.0)
MCHC: 29.5 g/dL — ABNORMAL LOW (ref 30.0–36.0)
MCV: 80.3 fL (ref 80.0–100.0)
Monocytes Absolute: 0.6 10*3/uL (ref 0.1–1.0)
Monocytes Relative: 7 %
Neutro Abs: 5.7 10*3/uL (ref 1.7–7.7)
Neutrophils Relative %: 68 %
Platelets: 330 10*3/uL (ref 150–400)
RBC: 3.8 MIL/uL — ABNORMAL LOW (ref 3.87–5.11)
RDW: 18.9 % — ABNORMAL HIGH (ref 11.5–15.5)
WBC: 8.2 10*3/uL (ref 4.0–10.5)
nRBC: 0 % (ref 0.0–0.2)

## 2019-03-03 LAB — PROTIME-INR
INR: 1 (ref 0.8–1.2)
Prothrombin Time: 12.9 seconds (ref 11.4–15.2)

## 2019-03-03 MED ORDER — MIDAZOLAM HCL 2 MG/2ML IJ SOLN
INTRAMUSCULAR | Status: AC | PRN
  Administered 2019-03-03 (×2): 1 mg via INTRAVENOUS

## 2019-03-03 MED ORDER — FENTANYL CITRATE (PF) 100 MCG/2ML IJ SOLN
INTRAMUSCULAR | Status: AC | PRN
  Administered 2019-03-03 (×2): 50 ug via INTRAVENOUS

## 2019-03-03 MED ORDER — MIDAZOLAM HCL 2 MG/2ML IJ SOLN
INTRAMUSCULAR | Status: AC
  Filled 2019-03-03: qty 4

## 2019-03-03 MED ORDER — HYDROCODONE-ACETAMINOPHEN 5-325 MG PO TABS: 1.0000 | ORAL_TABLET | ORAL | Status: DC | PRN

## 2019-03-03 MED ORDER — FENTANYL CITRATE (PF) 100 MCG/2ML IJ SOLN
INTRAMUSCULAR | Status: AC
  Filled 2019-03-03: qty 2

## 2019-03-03 MED ORDER — INSULIN ASPART PROT & ASPART (70-30 MIX) 100 UNIT/ML ~~LOC~~ SUSP
12.0000 [IU] | Freq: Once | SUBCUTANEOUS | Status: AC
  Administered 2019-03-03: 12 [IU] via SUBCUTANEOUS
  Filled 2019-03-03: qty 10

## 2019-03-03 MED ORDER — SODIUM CHLORIDE 0.9 % IV SOLN
INTRAVENOUS | Status: DC
  Administered 2019-03-03: 08:00:00 via INTRAVENOUS

## 2019-03-03 NOTE — Procedures (Signed)
  Procedure: CT bone marrow biopsy R iliac EBL:   minimal Complications:  none immediate  See full dictation in [facility].  Dillard Cannon MD Main # [phone] Pager  [phone]

## 2019-03-03 NOTE — Consult Note (Signed)
Chief Complaint: Patient was seen in consultation today for CT-guided bone marrow biopsy  Referring Physician(s): Nester,Kim R,NP  Supervising Physician: Arne Cleveland  Patient Status: [facility] - Out-pt  History of Present Illness: Kathryn Beck is a 63 y.o. 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  She presents today for CT-guided bone marrow biopsy to rule out multiple myeloma.  Past Medical History:  Diagnosis Date  . Acid reflux   . Anemia   . Arthritis   . Axillary masses    Soft tissue - status post excision  . Back pain   . Depression   . Diastolic heart failure ([place])   . End-stage renal disease ([place])    TTHSat  in [place]  . Essential hypertension   . History of blood transfusion   . History of claustrophobia   . History of pneumonia 2019  . Ischemic heart disease    Abnormal Myoview April 2018 - medical therapy  . Mixed hyperlipidemia   . Obesity   . Pancreatitis   . Peritoneal dialysis catheter in place [facility])   . Sleep apnea    Noncompliant with CPAP  . Type 2 diabetes mellitus ([place])     Past Surgical History:  Procedure Laterality Date  . ABDOMINAL HYSTERECTOMY    . AV FISTULA PLACEMENT Left 09/02/2017   Procedure: creation of left arm ARTERIOVENOUS (AV) FISTULA;  Surgeon: Serafina Mitchell, MD;  Location: [facility] OR;  Service: Vascular;  Laterality: Left;  . COLONOSCOPY  2008   Dr. Oneida Alar: normal   . COLONOSCOPY N/A 12/18/2016   Dr. Oneida Alar: multiple tubular adenomas, internal hemorrhoids. Surveillance in 3 years   . ESOPHAGEAL DILATION N/A 10/13/2015   Procedure: ESOPHAGEAL DILATION;  Surgeon: Rogene Houston, MD;  Location: [facility];  Service: Endoscopy;  Laterality: N/A;  .  ESOPHAGOGASTRODUODENOSCOPY N/A 10/13/2015   Dr. Laural Golden: chronic gastritis on path, no H.pylori. Empiric dilation   . ESOPHAGOGASTRODUODENOSCOPY N/A 12/18/2016   Dr. Oneida Alar: mild gastritis. BRAVO study revealed uncontrolled GERD. Dysphagia secondary to uncontrolled reflux  . FOOT SURGERY Bilateral    "nerve"    . LEFT HEART CATH AND CORONARY ANGIOGRAPHY N/A 12/29/2018   Procedure: LEFT HEART CATH AND CORONARY ANGIOGRAPHY;  Surgeon: Jettie Booze, MD;  Location: [facility] LAB;  Service: Cardiovascular;  Laterality: N/A;  . LUNG BIOPSY    . MASS EXCISION Right 01/09/2013   Procedure: EXCISION OF NEOPLASM OF RIGHT  AXILLA  AND EXCISION OF NEOPLASM OF LEFT AXILLA;  Surgeon: Jamesetta So, MD;  Location: [facility] ORS;  Service: General;  Laterality: Right;  procedure end @ 08:23  . MYRINGOTOMY WITH TUBE PLACEMENT Bilateral 04/28/2017   Procedure: BILATERAL MYRINGOTOMY WITH TUBE PLACEMENT;  Surgeon: Leta Baptist, MD;  Location: [facility];  Service: ENT;  Laterality: Bilateral;  . REVISION OF ARTERIOVENOUS GORETEX GRAFT Left 05/04/2018   Procedure: TRANSPOSITION OF CEPHALIC VEIN ARTERIOVENOUS FISTULA LEFT ARM;  Surgeon: Rosetta Posner, MD;  Location: [facility];  Service: Vascular;  Laterality: Left;  . SAVORY DILATION N/A 12/18/2016   Procedure: SAVORY DILATION;  Surgeon: Danie Binder, MD;  [facility];  Service: Endoscopy;  Laterality: N/A;    Allergies: Ace inhibitors, Penicillins, Statins, and Albuterol  Medications: Prior to Admission medications   Medication Sig Start Date  End Date Taking? Authorizing Provider  acetaminophen (TYLENOL) 500 MG tablet Take 1,000 mg by mouth every 8 (eight) hours as needed for mild pain.    Yes [provider]  amLODipine (NORVASC) 10 MG tablet TAKE 1 TABLET BY MOUTH ONCE DAILY. Patient taking differently: Take 10 mg by mouth daily.  08/26/18  Yes Fayrene Helper, MD  cloNIDine (CATAPRES) 0.3 MG tablet Take 1 tablet at 6 AM take 1/2 tablet at 2 PM  and take 1 tablet at 10 PM every day Patient taking differently: Take 0.3 mg by mouth See admin instructions. Take 0.3 mg at 6 AM take 0.15 mg at 2 PM and take 0.3 mg at 10 PM every day 10/27/18  Yes Fayrene Helper, MD  Continuous Blood Gluc Sensor (FREESTYLE LIBRE 14 DAY SENSOR) MISC 1 each by Does not apply route every 14 (fourteen) days. Change every 2 weeks 01/31/19  Yes [name], MD  dexlansoprazole (DEXILANT) 60 MG capsule TAKE 1 CAPSULE IN THE MORNING WITH FOOD 02/09/19  Yes Fayrene Helper, MD  ezetimibe (ZETIA) 10 MG tablet TAKE 1 TABLET BY MOUTH ONCE DAILY. Patient taking differently: Take 10 mg by mouth daily.  12/16/18  Yes Fayrene Helper, MD  fluorometholone (FML) 0.1 % ophthalmic suspension  01/30/19  Yes [provider]  furosemide (LASIX) 80 MG tablet Take 80 mg by mouth 2 (two) times daily. 10/31/18  Yes [provider]  gabapentin (NEURONTIN) 300 MG capsule TAKE (1) CAPSULE BY MOUTH THREE TIMES DAILY Patient taking differently: Take 300 mg by mouth 3 (three) times daily.  12/19/18  Yes Fayrene Helper, MD  GNP ASPIRIN LOW DOSE 81 MG EC tablet TAKE 1 TABLET BY MOUTH ONCE DAILY. Patient taking differently: Take 81 mg by mouth daily.  06/09/18  Yes Satira Sark, MD  Insulin Degludec (TRESIBA FLEXTOUCH) 200 UNIT/ML SOPN Inject 50-56 Units into the skin See admin instructions. For use after dialysis 02/03/19  Yes [name], MD  Insulin Lispro (HUMALOG KWIKPEN) 200 UNIT/ML SOPN Inject 24-28 Units into the skin 3 (three) times daily before meals. 01/31/19  Yes [name], MD  isosorbide mononitrate (IMDUR) 30 MG 24 hr tablet TAKE 1 TABLET BY MOUTH ONCE DAILY. Patient taking differently: Take 30 mg by mouth daily.  12/16/18  Yes Fayrene Helper, MD  metoprolol succinate (TOPROL-XL) 50 MG 24 hr tablet Take 50 mg by mouth every morning. 11/07/18  Yes [provider]  sertraline (ZOLOFT) 50 MG tablet TAKE 1 TABLET BY MOUTH  ONCE DAILY. Patient taking differently: Take 50 mg by mouth daily.  12/19/18  Yes Fayrene Helper, MD  [name] 500 MG chewable tablet Chew 500 mg by mouth See admin instructions. Take 500 mg with each snack and meal 11/07/18  Yes [provider]  zolpidem (AMBIEN) 10 MG tablet Take 10 mg by mouth at bedtime as needed for sleep.  11/17/18  Yes [provider]  diclofenac Sodium (VOLTAREN) 1 % GEL Apply topically. 08/22/18   [provider]  HYDROcodone-acetaminophen (NORCO/VICODIN) 5-325 MG tablet Take 1 tablet by mouth daily as needed for moderate pain.    [provider]  ondansetron (ZOFRAN) 4 MG tablet Take 1 tablet (4 mg total) by mouth every 8 (eight) hours as needed for nausea or vomiting. Patient not taking: Reported on 02/23/2019 05/27/18   Fayrene Helper, MD  FLUoxetine (PROZAC) 10 MG capsule Take 10 mg by mouth daily.    05/28/11  [provider]  glipiZIDE (GLUCOTROL) 10 MG tablet Take 10 mg by mouth 2 (two) times daily before a meal.    05/28/11  [provider]     Family History  Problem Relation Age of Onset  . Hypertension Father   . Hypercholesterolemia Father   . Arthritis Father   . Hypertension Sister   . Hypercholesterolemia Sister   . Breast cancer Sister   . Hypertension Sister   . Colon cancer Neg Hx   . Colon polyps Neg Hx     Social History   Socioeconomic History  . Marital status: Married    Spouse name: larry   . Number of children: 2  . Years of education: 63  . Highest education level: 12th grade  Occupational History  . Occupation: retired   Tobacco Use  . Smoking status: Never Smoker  . Smokeless tobacco: Never Used  Substance and Sexual Activity  . Alcohol use: No  . Drug use: No  . Sexual activity: Not Currently  Other Topics Concern  . Not on file  Social History Narrative   Lives alone with husband    Social Determinants of Health   Financial Resource Strain:   . Difficulty of  Paying Living Expenses: Not on file  Food Insecurity:   . Worried About Charity fundraiser in the Last Year: Not on file  . Ran Out of Food in the Last Year: Not on file  Transportation Needs:   . Lack of Transportation (Medical): Not on file  . Lack of Transportation (Non-Medical): Not on file  Physical Activity:   . Days of Exercise per Week: Not on file  . Minutes of Exercise per Session: Not on file  Stress:   . Feeling of Stress : Not on file  Social Connections:   . Frequency of Communication with Friends and Family: Not on file  . Frequency of Social Gatherings with Friends and Family: Not on file  . Attends Religious Services: Not on file  . Active Member of Clubs or Organizations: Not on file  . Attends Archivist Meetings: Not on file  . Marital Status: Not on file      Review of Systems currently denies fever, headache, chest pain, worsening dyspnea, cough, back pain, nausea, vomiting or bleeding.  She does have some slight tenderness at right lower quadrant peritoneal dialysis catheter site.  Vital Signs: BP (!) 167/77 (BP Location: Right Arm)   Pulse 80   Temp 98.3 F (36.8 C) (Oral)   Resp 20   Ht 5' 4"  (1.626 m)   SpO2 100%   BMI 39.00 kg/m   Physical Exam awake, alert.  Chest with slightly diminished breath sounds at bases, heart with regular rate /rhythm.  Abdomen obese, soft, positive bowel sounds, right lower quadrant peritoneal dialysis catheter in place, insertion site mildly tender, bilateral lower extremity edema noted  Imaging: DG Abd 1 View  Result Date: 02/20/2019 CLINICAL DATA:  Peritoneal dialysis catheter malfunction. EXAM: ABDOMEN - 1 VIEW COMPARISON:  None. FINDINGS: A peritoneal dialysis catheter appears intact and terminates in the pelvis just left of midline. Surgical clips and multiple phleboliths are noted in the pelvis. No dilated loops of bowel are seen to suggest obstruction. No acute osseous abnormality is identified.  IMPRESSION: Peritoneal dialysis catheter terminating in the pelvis. Electronically Signed   By: Logan Bores M.D.   On: 02/20/2019 17:08   DG Bone Survey Met  Result Date: 02/25/2019 CLINICAL DATA:  Multiple myeloma. EXAM: METASTATIC  BONE SURVEY COMPARISON:  November 05, 2017 FINDINGS: No definite lytic lesions identified in the imaged bones. Opacity in left base could represent atelectasis or infiltrate. Vascular calcifications are noted. A peritoneal dialysis catheter terminates in the pelvis. No other abnormalities. IMPRESSION: No lytic lesions identified in the imaged bones. Opacity in the left lung base could represent atelectasis or infiltrate. Electronically Signed   By: Dorise Bullion III M.D   On: 02/25/2019 03:10    Labs:  CBC: Recent Labs    12/05/18 1813 12/29/18 0817 02/23/19 1020 03/03/19 0800  WBC 9.3 8.6 7.1 8.2  HGB 8.9* 8.4* 8.7* 9.0*  HCT 30.5* 28.2* 30.1* 30.5*  PLT 386 408* 315 330    COAGS: No results for input(s): INR, APTT in the last 8760 hours.  BMP: Recent Labs    09/12/18 0915 10/04/18 1207 12/05/18 1813 12/29/18 0817 02/23/19 1020  NA 142  --  137 140 140  K 5.2* 4.9 3.1* 4.0 3.1*  CL 104  --  100 101 100  CO2 28  --  24 26 24   GLUCOSE 144*  --  124* 189* 63*  BUN 33*  --  19 21 31*  CALCIUM 8.8*  --  8.9 9.6 9.0  CREATININE 5.09*  --  3.93* 4.56* 4.06*  GFRNONAA 8*  --  12* 10* 11*  GFRAA 10*  --  13* 11* 13*    LIVER FUNCTION TESTS: Recent Labs    03/08/18 1234 09/03/18 0810 09/12/18 0915 02/23/19 1020  BILITOT 0.5 0.4 0.2* 0.3  AST 22 17 25 21   ALT 33 24 33 22  ALKPHOS 93 129* 139* 143*  PROT 7.1 7.3 7.4 8.0  ALBUMIN 3.1* 3.4* 3.1* 3.5    TUMOR MARKERS: No results for input(s): AFPTM, CEA, CA199, CHROMGRNA in the last 8760 hours.  Assessment and Plan: 63 y.o. 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  She presents today for CT-guided bone marrow biopsy to rule out multiple myeloma.Risks and benefits of procedure was discussed with the patient  including, but not limited to bleeding, infection, damage to adjacent structures or low yield requiring additional tests.  All of the questions were answered and there is agreement to proceed.  Consent signed and in chart.     Thank you for this interesting consult.  I greatly enjoyed meeting RECHELLE NIEBLA and look forward to participating in their care.  A copy of this report was sent to the requesting provider on this date.  Electronically Signed: D. Rowe Robert, PA-C 03/03/2019, 8:26 AM   I spent a total of 20 minutes  in face to face in clinical consultation, greater than 50% of which was counseling/coordinating care for CT-guided bone marrow biopsy

## 2019-03-03 NOTE — Discharge Instructions (Signed)
Please call IR clinic [phone] or after hours and ask for Interventional Radiologist in call [phone].  You may remove your dressing and shower tomorrow.  Moderate Conscious Sedation, Adult, Care After These instructions provide you with information about caring for yourself after your procedure. Your health care provider may also give you more specific instructions. Your treatment has been planned according to current medical practices, but problems sometimes occur. Call your health care provider if you have any problems or questions after your procedure. What can I expect after the procedure? After your procedure, it is common:  To feel sleepy for several hours.  To feel clumsy and have poor balance for several hours.  To have poor judgment for several hours.  To vomit if you eat too soon. Follow these instructions at home: For at least 24 hours after the procedure:   Do not: ? Participate in activities where you could fall or become injured. ? Drive. ? Use heavy machinery. ? Drink alcohol. ? Take sleeping pills or medicines that cause drowsiness. ? Make important decisions or sign legal documents. ? Take care of children on your own.  Rest. Eating and drinking  Follow the diet recommended by your health care provider.  If you vomit: ? Drink water, juice, or soup when you can drink without vomiting. ? Make sure you have little or no nausea before eating solid foods. General instructions  Have a responsible adult stay with you until you are awake and alert.  Take over-the-counter and prescription medicines only as told by your health care provider.  If you smoke, do not smoke without supervision.  Keep all follow-up visits as told by your health care provider. This is important. Contact a health care provider if:  You keep feeling nauseous or you keep vomiting.  You feel light-headed.  You develop a rash.  You have a fever. Get help right away  if:  You have trouble breathing. This information is not intended to replace advice given to you by your health care provider. Make sure you discuss any questions you have with your health care provider. Document Released: 12/28/2012 Document Revised: 02/19/2017 Document Reviewed: 06/29/2015 Elsevier Patient Education  2020 [place] [employer].   Bone Marrow Aspiration and Bone Marrow Biopsy, Adult, Care After This sheet gives you information about how to care for yourself after your procedure. Your health care provider may also give you more specific instructions. If you have problems or questions, contact your health care provider. What can I expect after the procedure? After the procedure, it is common to have:  Mild pain and tenderness.  Swelling.  Bruising. Follow these instructions at home: Puncture site care      Follow instructions from your health care provider about how to take care of the puncture site. Make sure you: ? Wash your hands with soap and water before you change your bandage (dressing). If soap and water are not available, use hand sanitizer. ? Change your dressing as told by your health care provider.  Check your puncture siteevery day for signs of infection. Check for: ? More redness, swelling, or pain. ? More fluid or blood. ? Warmth. ? Pus or a bad smell. General instructions  Take over-the-counter and prescription medicines only as told by your health care provider.  Do not take baths, swim, or use a hot tub until your health care provider approves. Ask if you can take a shower or have a sponge bath.  Return to your normal activities as told  by your health care provider. Ask your health care provider what activities are safe for you.  Do not drive for 24 hours if you were given a medicine to help you relax (sedative) during your procedure.  Keep all follow-up visits as told by your health care provider. This is important. Contact a health care  provider if:  Your pain is not controlled with medicine. Get help right away if:  You have a fever.  You have more redness, swelling, or pain around the puncture site.  You have more fluid or blood coming from the puncture site.  Your puncture site feels warm to the touch.  You have pus or a bad smell coming from the puncture site. These symptoms may represent a serious problem that is an emergency. Do not wait to see if the symptoms will go away. Get medical help right away. Call your local emergency services (911 in the [place] Do not drive yourself to the hospital. Summary  After the procedure, it is common to have mild pain, tenderness, swelling, and bruising.  Follow instructions from your health care provider about how to take care of the puncture site.  Get help right away if you have any symptoms of infection or if you have more blood or fluid coming from the puncture site. This information is not intended to replace advice given to you by your health care provider. Make sure you discuss any questions you have with your health care provider. Document Released: 09/26/2004 Document Revised: 06/22/2017 Document Reviewed: 08/21/2015 Elsevier Patient Education  2020 [employer].

## 2019-03-13 ENCOUNTER — Encounter: Payer: 59 | Admitting: Family Medicine

## 2019-03-13 ENCOUNTER — Encounter (HOSPITAL_COMMUNITY): Payer: Self-pay | Admitting: Hematology

## 2019-03-19 LAB — SURGICAL PATHOLOGY

## 2019-03-20 ENCOUNTER — Inpatient Hospital Stay (HOSPITAL_BASED_OUTPATIENT_CLINIC_OR_DEPARTMENT_OTHER): Payer: 59 | Admitting: Nurse Practitioner

## 2019-03-20 ENCOUNTER — Other Ambulatory Visit: Payer: Self-pay

## 2019-03-20 ENCOUNTER — Encounter (HOSPITAL_COMMUNITY): Payer: Self-pay | Admitting: Nurse Practitioner

## 2019-03-20 VITALS — BP 152/61 | HR 85 | Temp 97.7°F | Resp 20 | Wt 212.1 lb

## 2019-03-20 DIAGNOSIS — D472 Monoclonal gammopathy: Secondary | ICD-10-CM

## 2019-03-20 DIAGNOSIS — D638 Anemia in other chronic diseases classified elsewhere: Secondary | ICD-10-CM

## 2019-03-20 NOTE — Assessment & Plan Note (Addendum)
1.  Monoclonal gammopathy of uncertain significance: -Patient with a history of CKD stage IV, work-up for monoclonal gammopathy on 08/09/2017 shows an M spike of 0.1 g/dL of IgG kappa monoclonal protein.  Free kappa light chains were elevated at 171 and lambda light chains 75.7.  Ratio was 2.27.  24-hour urine showed nephrotic range proteinuria.  This was 18 g. -Skeletal survey on 11/05/2017 was negative for lytic lesions. -Most recent myeloma labs are from October 2019, with kappa light chains elevated at 175.5 g/L, lambda light chain 75.4, and ratio 2.33.  Protein electrophoresis significant for M spike of 0.3 g/dL, IFE with faint band in the gamma region suspicious for monoclonal immunoglobulin. -Skeletal survey done on 02/24/2019 showed no lytic lesions. -Bone marrow biopsy done on 03/03/2019 showed -She will follow-up in 3 months with repeat labs.  2.  CKD: -Thought to be secondary to a long history of diabetes, hypertension, obesity related glomerulopathy. -She is being followed by a transplant team at [facility]. -They have worked her up and are waiting the results of the bone marrow biopsy. -She will follow-up with the transplant team for a kidney transplant.  3.  Microcytic anemia: -She did receive 2 units of PRBC during hospitalization in June.  She also received Feraheme at that time. -She received Procrit injections in 2019.  However her hemoglobin improved to the point where she no longer needed them. -Labs on 03/03/2019 showed hemoglobin 9.0 -Procrit injections were started back with her dialysis. -She received 8000 units with every treatment on Tuesday Thursday Saturday so that she gets 24,000 units weekly. -We will recheck her iron levels at her next visit.

## 2019-03-20 NOTE — Progress Notes (Signed)
Kathryn Beck, [place] [address]   CLINIC:  Medical Oncology/Hematology  PCP:  Kathryn Helper, MD [address] [place] [address] [phone]   REASON FOR VISIT: Follow-up for MGUS  CURRENT THERAPY: Observation   INTERVAL HISTORY:  Kathryn Beck 63 y.o. female returns for routine follow-up for MGUS.  Patient reports she has been doing well since her last visit.  She denies any bright red bleeding per rectum or melena.  She denies any easy bruising or bleeding.  She does report every day fatigue. Denies any nausea, vomiting, or diarrhea. Denies any new pains. Had not noticed any recent bleeding such as epistaxis, hematuria or hematochezia. Denies recent chest pain on exertion, shortness of breath on minimal exertion, pre-syncopal episodes, or palpitations. Denies any numbness or tingling in hands or feet. Denies any recent fevers, infections, or recent hospitalizations. Patient reports appetite at 75% and energy level at 25%.  She is eating well maintaining her weight at this time.    REVIEW OF SYSTEMS:  Review of Systems  Constitutional: Positive for fatigue.  Cardiovascular: Positive for leg swelling.  Neurological: Positive for numbness.  Psychiatric/Behavioral: Positive for sleep disturbance.  All other systems reviewed and are negative.    PAST MEDICAL/SURGICAL HISTORY:  Past Medical History:  Diagnosis Date  . Acid reflux   . Anemia   . Arthritis   . Axillary masses    Soft tissue - status post excision  . Back pain   . Depression   . Diastolic heart failure ([place])   . End-stage renal disease ([place])    TTHSat  in [place]  . Essential hypertension   . History of blood transfusion   . History of claustrophobia   . History of pneumonia 2019  . Ischemic heart disease    Abnormal Myoview April 2018 - medical therapy  . Mixed hyperlipidemia   . Obesity   . Pancreatitis   . Peritoneal dialysis catheter in place  [facility])   . Sleep apnea    Noncompliant with CPAP  . Type 2 diabetes mellitus ([place])    Past Surgical History:  Procedure Laterality Date  . ABDOMINAL HYSTERECTOMY    . AV FISTULA PLACEMENT Left 09/02/2017   Procedure: creation of left arm ARTERIOVENOUS (AV) FISTULA;  Surgeon: Serafina Mitchell, MD;  Location: [facility] OR;  Service: Vascular;  Laterality: Left;  . COLONOSCOPY  2008   Dr. Oneida Alar: normal   . COLONOSCOPY N/A 12/18/2016   Dr. Oneida Alar: multiple tubular adenomas, internal hemorrhoids. Surveillance in 3 years   . ESOPHAGEAL DILATION N/A 10/13/2015   Procedure: ESOPHAGEAL DILATION;  Surgeon: Rogene Houston, MD;  [facility];  Service: Endoscopy;  Laterality: N/A;  . ESOPHAGOGASTRODUODENOSCOPY N/A 10/13/2015   Dr. Laural Golden: chronic gastritis on path, no H.pylori. Empiric dilation   . ESOPHAGOGASTRODUODENOSCOPY N/A 12/18/2016   Dr. Oneida Alar: mild gastritis. BRAVO study revealed uncontrolled GERD. Dysphagia secondary to uncontrolled reflux  . FOOT SURGERY Bilateral    "nerve"    . LEFT HEART CATH AND CORONARY ANGIOGRAPHY N/A 12/29/2018   Procedure: LEFT HEART CATH AND CORONARY ANGIOGRAPHY;  Surgeon: Jettie Booze, MD;  Location: [facility] LAB;  Service: Cardiovascular;  Laterality: N/A;  . LUNG BIOPSY    . MASS EXCISION Right 01/09/2013   Procedure: EXCISION OF NEOPLASM OF RIGHT  AXILLA  AND EXCISION OF NEOPLASM OF LEFT AXILLA;  Surgeon: Jamesetta So, MD;  Location: [facility] ORS;  Service:  General;  Laterality: Right;  procedure end @ 08:23  . MYRINGOTOMY WITH TUBE PLACEMENT Bilateral 04/28/2017   Procedure: BILATERAL MYRINGOTOMY WITH TUBE PLACEMENT;  Surgeon: Leta Baptist, MD;  Location: [place];  Service: ENT;  Laterality: Bilateral;  . REVISION OF ARTERIOVENOUS GORETEX GRAFT Left 05/04/2018   Procedure: TRANSPOSITION OF CEPHALIC VEIN ARTERIOVENOUS FISTULA LEFT ARM;  Surgeon: Rosetta Posner, MD;  Location: [place];  Service: Vascular;  Laterality: Left;  . SAVORY DILATION N/A 12/18/2016    Procedure: SAVORY DILATION;  Surgeon: Danie Binder, MD;  [facility];  Service: Endoscopy;  Laterality: N/A;     SOCIAL HISTORY:  Social History   Socioeconomic History  . Marital status: Married    Spouse name: larry   . Number of children: 2  . Years of education: 35  . Highest education level: 12th grade  Occupational History  . Occupation: retired   Tobacco Use  . Smoking status: Never Smoker  . Smokeless tobacco: Never Used  Substance and Sexual Activity  . Alcohol use: No  . Drug use: No  . Sexual activity: Not Currently  Other Topics Concern  . Not on file  Social History Narrative   Lives alone with husband    Social Determinants of Health   Financial Resource Strain:   . Difficulty of Paying Living Expenses: Not on file  Food Insecurity:   . Worried About Charity fundraiser in the Last Year: Not on file  . Ran Out of Food in the Last Year: Not on file  Transportation Needs:   . Lack of Transportation (Medical): Not on file  . Lack of Transportation (Non-Medical): Not on file  Physical Activity:   . Days of Exercise per Week: Not on file  . Minutes of Exercise per Session: Not on file  Stress:   . Feeling of Stress : Not on file  Social Connections:   . Frequency of Communication with Friends and Family: Not on file  . Frequency of Social Gatherings with Friends and Family: Not on file  . Attends Religious Services: Not on file  . Active Member of Clubs or Organizations: Not on file  . Attends Archivist Meetings: Not on file  . Marital Status: Not on file  Intimate Partner Violence:   . Fear of Current or Ex-Partner: Not on file  . Emotionally Abused: Not on file  . Physically Abused: Not on file  . Sexually Abused: Not on file    FAMILY HISTORY:  Family History  Problem Relation Age of Onset  . Hypertension Father   . Hypercholesterolemia Father   . Arthritis Father   . Hypertension Sister   . Hypercholesterolemia  Sister   . Breast cancer Sister   . Hypertension Sister   . Colon cancer Neg Hx   . Colon polyps Neg Hx     CURRENT MEDICATIONS:  Outpatient Encounter Medications as of 03/20/2019  Medication Sig Note  . allopurinol (ZYLOPRIM) 300 MG tablet Take by mouth.   Marland Kitchen amLODipine (NORVASC) 10 MG tablet TAKE 1 TABLET BY MOUTH ONCE DAILY. (Patient taking differently: Take 10 mg by mouth daily. )   . cloNIDine (CATAPRES) 0.3 MG tablet Take 1 tablet at 6 AM take 1/2 tablet at 2 PM and take 1 tablet at 10 PM every day (Patient taking differently: Take 0.3 mg by mouth See admin instructions. Take 0.3 mg at 6 AM take 0.15 mg at 2 PM and take 0.3 mg at 10  PM every day)   . Continuous Blood Gluc Sensor (FREESTYLE LIBRE 14 DAY SENSOR) MISC 1 each by Does not apply route every 14 (fourteen) days. Change every 2 weeks   . dexlansoprazole (DEXILANT) 60 MG capsule TAKE 1 CAPSULE IN THE MORNING WITH FOOD   . ezetimibe (ZETIA) 10 MG tablet TAKE 1 TABLET BY MOUTH ONCE DAILY. (Patient taking differently: Take 10 mg by mouth daily. )   . furosemide (LASIX) 80 MG tablet Take 80 mg by mouth 2 (two) times daily.   Marland Kitchen gabapentin (NEURONTIN) 300 MG capsule TAKE (1) CAPSULE BY MOUTH THREE TIMES DAILY (Patient taking differently: Take 300 mg by mouth 3 (three) times daily. )   . GNP ASPIRIN LOW DOSE 81 MG EC tablet TAKE 1 TABLET BY MOUTH ONCE DAILY. (Patient taking differently: Take 81 mg by mouth daily. )   . Insulin Degludec (TRESIBA FLEXTOUCH) 200 UNIT/ML SOPN Inject 50-56 Units into the skin See admin instructions. For use after dialysis   . Insulin Lispro (HUMALOG KWIKPEN) 200 UNIT/ML SOPN Inject 24-28 Units into the skin 3 (three) times daily before meals.   . isosorbide mononitrate (IMDUR) 30 MG 24 hr tablet TAKE 1 TABLET BY MOUTH ONCE DAILY. (Patient taking differently: Take 30 mg by mouth daily. )   . labetalol (NORMODYNE) 200 MG tablet Take by mouth.   . metoprolol succinate (TOPROL-XL) 50 MG 24 hr tablet Take 50 mg by  mouth every morning.   . sertraline (ZOLOFT) 50 MG tablet TAKE 1 TABLET BY MOUTH ONCE DAILY. (Patient taking differently: Take 50 mg by mouth daily. )   . [name] 500 MG chewable tablet Chew 500 mg by mouth See admin instructions. Take 500 mg with each snack and meal   . acetaminophen (TYLENOL) 500 MG tablet Take 1,000 mg by mouth every 8 (eight) hours as needed for mild pain.    Marland Kitchen diclofenac Sodium (VOLTAREN) 1 % GEL Apply topically.   . dicyclomine (BENTYL) 10 MG capsule Take by mouth.   . fluorometholone (FML) 0.1 % ophthalmic suspension    . HYDROcodone-acetaminophen (NORCO/VICODIN) 5-325 MG tablet Take 1 tablet by mouth daily as needed for moderate pain.   Marland Kitchen ondansetron (ZOFRAN) 4 MG tablet Take 1 tablet (4 mg total) by mouth every 8 (eight) hours as needed for nausea or vomiting. (Patient not taking: Reported on 02/23/2019) 12/05/2018: Patient states that she was given this medication IV today at dialysis  . zolpidem (AMBIEN) 10 MG tablet Take 10 mg by mouth at bedtime as needed for sleep.    . [DISCONTINUED] FLUoxetine (PROZAC) 10 MG capsule Take 10 mg by mouth daily.     . [DISCONTINUED] glipiZIDE (GLUCOTROL) 10 MG tablet Take 10 mg by mouth 2 (two) times daily before a meal.      No facility-administered encounter medications on file as of 03/20/2019.    ALLERGIES:  Allergies  Allergen Reactions  . Ace Inhibitors Anaphylaxis and Swelling  . Penicillins Itching, Swelling and Other (See Comments)    Did it involve swelling of the face/tongue/throat, SOB, or low BP? Unknown Did it involve sudden or severe rash/hives, skin peeling, or any reaction on the inside of your mouth or nose? Unknown Did you need to seek medical attention at a hospital or doctor's office? Unknown When did it last happen?years  If all above answers are "NO", may proceed with cephalosporin use.   . Statins Other (See Comments)    elevated LFT's    . Albuterol Swelling  PHYSICAL EXAM:  ECOG  Performance status: 1  Vitals:   03/20/19 1407  BP: (!) 152/61  Pulse: 85  Resp: 20  Temp: 97.7 F (36.5 C)  SpO2: 93%   Filed Weights   03/20/19 1407  Weight: 212 lb 1.6 oz (96.2 kg)    Physical Exam Constitutional:      Appearance: Normal appearance. She is normal weight.  Cardiovascular:     Rate and Rhythm: Normal rate and regular rhythm.     Heart sounds: Normal heart sounds.  Pulmonary:     Effort: Pulmonary effort is normal.     Breath sounds: Normal breath sounds.  Abdominal:     General: Bowel sounds are normal.     Palpations: Abdomen is soft.  Musculoskeletal:        General: Normal range of motion.  Skin:    General: Skin is warm.  Neurological:     Mental Status: She is alert and oriented to person, place, and time. Mental status is at baseline.  Psychiatric:        Mood and Affect: Mood normal.        Behavior: Behavior normal.        Thought Content: Thought content normal.        Judgment: Judgment normal.      LABORATORY DATA:  I have reviewed the labs as listed.  CBC    Component Value Date/Time   WBC 8.2 03/03/2019 0800   RBC 3.80 (L) 03/03/2019 0800   HGB 9.0 (L) 03/03/2019 0800   HCT 30.5 (L) 03/03/2019 0800   PLT 330 03/03/2019 0800   MCV 80.3 03/03/2019 0800   MCH 23.7 (L) 03/03/2019 0800   MCHC 29.5 (L) 03/03/2019 0800   RDW 18.9 (H) 03/03/2019 0800   LYMPHSABS 1.8 03/03/2019 0800   MONOABS 0.6 03/03/2019 0800   EOSABS 0.1 03/03/2019 0800   BASOSABS 0.0 03/03/2019 0800   CMP Latest Ref Rng & Units 02/23/2019 12/29/2018 12/05/2018  Glucose 70 - 99 mg/dL 63(L) 189(H) 124(H)  BUN 8 - 23 mg/dL 31(H) 21 19  Creatinine 0.44 - 1.00 mg/dL 4.06(H) 4.56(H) 3.93(H)  Sodium 135 - 145 mmol/L 140 140 137  Potassium 3.5 - 5.1 mmol/L 3.1(L) 4.0 3.1(L)  Chloride 98 - 111 mmol/L 100 101 100  CO2 22 - 32 mmol/L _0 Calcium 8.9 - 10.3 mg/dL 9.0 9.6 8.9  Total Protein 6.5 - 8.1 g/dL 8.0 - -  Total Bilirubin 0.3 - 1.2 mg/dL 0.3 - -    Alkaline Phos 38 - 126 U/L 143(H) - -  AST 15 - 41 U/L 21 - -  ALT 0 - 44 U/L 22 - -    DIAGNOSTIC IMAGING:  I have independently reviewed the skeletal survey and bone marrow biopsy and discussed with the patient.    I personally performed a face-to-face visit.  All questions were answered to patient's stated satisfaction. Encouraged patient to call with any new concerns or questions before his next visit to the cancer center and we can certain see him sooner, if needed.     ASSESSMENT & PLAN:   MGUS (monoclonal gammopathy of unknown significance) 1.  Monoclonal gammopathy of uncertain significance: -Patient with a history of CKD stage IV, work-up for monoclonal gammopathy on 08/09/2017 shows an M spike of 0.1 g/dL of IgG kappa monoclonal protein.  Free kappa light chains were elevated at 171 and lambda light chains 75.7.  Ratio was 2.27.  24-hour urine showed nephrotic range  proteinuria.  This was 18 g. -Skeletal survey on 11/05/2017 was negative for lytic lesions. -Most recent myeloma labs are from October 2019, with kappa light chains elevated at 175.5 g/L, lambda light chain 75.4, and ratio 2.33.  Protein electrophoresis significant for M spike of 0.3 g/dL, IFE with faint band in the gamma region suspicious for monoclonal immunoglobulin. -Skeletal survey done on 02/24/2019 showed no lytic lesions. -Bone marrow biopsy done on 03/03/2019 showed -She will follow-up in 3 months with repeat labs.  2.  CKD: -Thought to be secondary to a long history of diabetes, hypertension, obesity related glomerulopathy. -She is being followed by a transplant team at [facility]. -They have worked her up and are waiting the results of the bone marrow biopsy. -She will follow-up with the transplant team for a kidney transplant.  3.  Microcytic anemia: -She did receive 2 units of PRBC during hospitalization in June.  She also received Feraheme at that time. -She received Procrit injections  in 2019.  However her hemoglobin improved to the point where she no longer needed them. -Labs on 03/03/2019 showed hemoglobin 9.0 -Procrit injections were started back with her dialysis. -She received 8000 units with every treatment on Tuesday Thursday Saturday so that she gets 24,000 units weekly. -We will recheck her iron levels at her next visit.      Orders placed this encounter:  Orders Placed This Encounter  Procedures  . Lactate dehydrogenase  . Protein electrophoresis, serum  . Kappa/lambda light chains  . CBC with Differential/Platelet  . Comprehensive metabolic panel  . Ferritin  . Iron and TIBC  . Vitamin B12  . Vitamin D 25 hydroxy      Francene Finders, FNP-C [facility] [phone]

## 2019-03-20 NOTE — Patient Instructions (Signed)
[place] at [facility] Discharge Instructions  Follow-up in 3 months with repeat labs   Thank you for choosing [place] at [facility] to provide your oncology and hematology care.  To afford each patient quality time with our provider, please arrive at least 15 minutes before your scheduled appointment time.   If you have a lab appointment with the [facility] please come in thru the Main Entrance and check in at the main information desk.  You need to re-schedule your appointment should you arrive 10 or more minutes late.  We strive to give you quality time with our providers, and arriving late affects you and other patients whose appointments are after yours.  Also, if you no show three or more times for appointments you may be dismissed from the clinic at the providers discretion.     Again, thank you for choosing [facility].  Our hope is that these requests will decrease the amount of time that you wait before being seen by our physicians.       _____________________________________________________________  Should you have questions after your visit to [facility], please contact our office at [phone] between the hours of 8:00 a.m. and 4:30 p.m.  Voicemails left after 4:00 p.m. will not be returned until the following business day.  For prescription refill requests, have your pharmacy contact our office and allow 72 hours.    Due to Covid, you will need to wear a mask upon entering the hospital. If you do not have a mask, a mask will be given to you at the Main Entrance upon arrival. For doctor visits, patients may have 1 support person with them. For treatment visits, patients can not have anyone with them due to social distancing guidelines and our immunocompromised population.

## 2019-03-23 ENCOUNTER — Other Ambulatory Visit: Payer: Self-pay

## 2019-03-23 ENCOUNTER — Ambulatory Visit (INDEPENDENT_AMBULATORY_CARE_PROVIDER_SITE_OTHER): Payer: 59 | Admitting: Family Medicine

## 2019-03-23 ENCOUNTER — Encounter: Payer: Self-pay | Admitting: Family Medicine

## 2019-03-23 VITALS — BP 144/70 | HR 88 | Temp 97.8°F | Resp 15 | Ht 64.0 in | Wt 209.0 lb

## 2019-03-23 DIAGNOSIS — E782 Mixed hyperlipidemia: Secondary | ICD-10-CM

## 2019-03-23 DIAGNOSIS — E1140 Type 2 diabetes mellitus with diabetic neuropathy, unspecified: Secondary | ICD-10-CM

## 2019-03-23 DIAGNOSIS — E559 Vitamin D deficiency, unspecified: Secondary | ICD-10-CM

## 2019-03-23 DIAGNOSIS — M542 Cervicalgia: Secondary | ICD-10-CM | POA: Diagnosis not present

## 2019-03-23 DIAGNOSIS — Z794 Long term (current) use of insulin: Secondary | ICD-10-CM

## 2019-03-23 DIAGNOSIS — Z0000 Encounter for general adult medical examination without abnormal findings: Secondary | ICD-10-CM

## 2019-03-23 DIAGNOSIS — I10 Essential (primary) hypertension: Secondary | ICD-10-CM

## 2019-03-23 MED ORDER — METOPROLOL SUCCINATE ER 50 MG PO TB24: 50.0000 mg | ORAL_TABLET | Freq: Every day | ORAL | 3 refills | Status: DC

## 2019-03-23 NOTE — Patient Instructions (Addendum)
F/U in office with MD in 4 months, cal;l if you need me sooner  Please get X ray of your neck in the next 1 to 2 weeks  Limit hydrocodoen to 2 per week maximum, use tylenol instead, you report about 6 weeks of hydrocodone currently so no prescription today.  Thankful you are in line for a kidney  Vit D, fasting lipid and hepatic and tSH in the next 1 to 4 weeks please  Thanks for choosing [facility], we consider it a privelige to serve you.

## 2019-03-23 NOTE — Progress Notes (Signed)
      Kathryn Beck     MRN: [id number]      DOB: Dec 10, 1955  HPI: Patient is in for annual physical exam. C/o chronic debilitating neck and upper extremity pain, relies more on tylenol than hydrocodone , pain generally an 8 and sometimes wakens her  Currently on list for kidney , has ahd to return to hemodialysis as peritoneal access remains occluded   Improved blood sugar reported , though still fluctuating more than desired  .   PE: BP (!) 144/70   Pulse 88   Temp 97.8 F (36.6 C) (Temporal)   Resp 15   Ht 5\' 4"  (1.626 m)   Wt 209 lb (94.8 kg)   SpO2 98%   BMI 35.87 kg/m   Pleasant  female, alert and oriented x 3, in no cardio-pulmonary distress. Afebrile. HEENT No facial trauma or asymetry. Sinuses non tender.  Extra occullar muscles intact.. External ears normal, . Neck: decreased ROM, no adenopathy,JVD or thyromegaly.No bruits.  Chest: Clear to ascultation bilaterally.No crackles or wheezes. Non tender to palpation  Breast: Not examined, asymptomatic, has mammogram scheduled in next month  Cardiovascular system; Heart sounds normal,  S1 and  S2 ,no S3.  No murmur, or thrill. Apical beat not displaced   Abdomen: Soft, non tender, No guarding, tenderness or rebound.   GU: Asymptomatic, not examined  Musculoskeletal exam: Decreased  ROM of spine,adequate in  hips , shoulders and knees. No deformity ,swelling or crepitus noted. No muscle wasting or atrophy.   Neurologic: Cranial nerves 2 to 12 intact. Power, tone ,sensation and reflexes normal throughout. No disturbance in gait. No tremor.  Skin: Intact, no ulceration, erythema , scaling or rash noted. Pigmentation normal throughout  Psych; Normal mood and affect. Judgement and concentration normal   Assessment & Plan:  Annual physical exam Annual exam as documented.   Neck pain Disabling left neck and upper extremity pain, will rely more on tylenol and limit hydrocodone use, needs X  ray of neck

## 2019-03-23 NOTE — Assessment & Plan Note (Addendum)
Annual exam as documented. 

## 2019-03-24 NOTE — Assessment & Plan Note (Signed)
Disabling left neck and upper extremity pain, will rely more on tylenol and limit hydrocodone use, needs X ray of neck

## 2019-03-28 ENCOUNTER — Other Ambulatory Visit: Payer: Self-pay

## 2019-03-28 ENCOUNTER — Encounter (HOSPITAL_COMMUNITY): Payer: Self-pay | Admitting: *Deleted

## 2019-03-28 ENCOUNTER — Emergency Department (HOSPITAL_COMMUNITY): Payer: 59

## 2019-03-28 ENCOUNTER — Emergency Department (HOSPITAL_COMMUNITY)
Admission: EM | Admit: 2019-03-28 | Discharge: 2019-03-28 | Disposition: A | Discharge status: Home / Self Care | Payer: 59 | Attending: Emergency Medicine | Admitting: Emergency Medicine

## 2019-03-28 DIAGNOSIS — N186 End stage renal disease: Secondary | ICD-10-CM | POA: Insufficient documentation

## 2019-03-28 DIAGNOSIS — I132 Hypertensive heart and chronic kidney disease with heart failure and with stage 5 chronic kidney disease, or end stage renal disease: Secondary | ICD-10-CM | POA: Diagnosis not present

## 2019-03-28 DIAGNOSIS — R531 Weakness: Secondary | ICD-10-CM | POA: Diagnosis present

## 2019-03-28 DIAGNOSIS — Z992 Dependence on renal dialysis: Secondary | ICD-10-CM | POA: Diagnosis not present

## 2019-03-28 DIAGNOSIS — U071 COVID-19: Secondary | ICD-10-CM | POA: Insufficient documentation

## 2019-03-28 DIAGNOSIS — Z79899 Other long term (current) drug therapy: Secondary | ICD-10-CM | POA: Diagnosis not present

## 2019-03-28 DIAGNOSIS — I5030 Unspecified diastolic (congestive) heart failure: Secondary | ICD-10-CM | POA: Insufficient documentation

## 2019-03-28 DIAGNOSIS — E1122 Type 2 diabetes mellitus with diabetic chronic kidney disease: Secondary | ICD-10-CM | POA: Diagnosis not present

## 2019-03-28 LAB — CBC
HCT: 32.9 % — ABNORMAL LOW (ref 36.0–46.0)
Hemoglobin: 9.7 g/dL — ABNORMAL LOW (ref 12.0–15.0)
MCH: 23.8 pg — ABNORMAL LOW (ref 26.0–34.0)
MCHC: 29.5 g/dL — ABNORMAL LOW (ref 30.0–36.0)
MCV: 80.6 fL (ref 80.0–100.0)
Platelets: 298 10*3/uL (ref 150–400)
RBC: 4.08 MIL/uL (ref 3.87–5.11)
RDW: 19.5 % — ABNORMAL HIGH (ref 11.5–15.5)
WBC: 8.4 10*3/uL (ref 4.0–10.5)
nRBC: 1.2 % — ABNORMAL HIGH (ref 0.0–0.2)

## 2019-03-28 LAB — BASIC METABOLIC PANEL
Anion gap: 15 (ref 5–15)
BUN: 38 mg/dL — ABNORMAL HIGH (ref 8–23)
CO2: 24 mmol/L (ref 22–32)
Calcium: 8.8 mg/dL — ABNORMAL LOW (ref 8.9–10.3)
Chloride: 97 mmol/L — ABNORMAL LOW (ref 98–111)
Creatinine, Ser: 5.71 mg/dL — ABNORMAL HIGH (ref 0.44–1.00)
GFR calc Af Amer: 9 mL/min — ABNORMAL LOW (ref >60)
GFR calc non Af Amer: 7 mL/min — ABNORMAL LOW (ref >60)
Glucose, Bld: 72 mg/dL (ref 70–99)
Potassium: 3.7 mmol/L (ref 3.5–5.1)
Sodium: 136 mmol/L (ref 135–145)

## 2019-03-28 LAB — POC SARS CORONAVIRUS 2 AG -  ED: SARS Coronavirus 2 Ag: POSITIVE — AB

## 2019-03-28 MED ORDER — ACETAMINOPHEN 325 MG PO TABS
650.0000 mg | ORAL_TABLET | Freq: Once | ORAL | Status: AC
  Administered 2019-03-28: 22:00:00 650 mg via ORAL
  Filled 2019-03-28: qty 2

## 2019-03-28 MED ORDER — SODIUM CHLORIDE 0.9% FLUSH: 3.0000 mL | Freq: Once | INTRAVENOUS | Status: DC

## 2019-03-28 NOTE — Discharge Instructions (Addendum)
Take Tylenol for fever breath and follow-up with your doctor if you become short of breath.  Go ahead and go to your dialysis

## 2019-03-28 NOTE — ED Triage Notes (Signed)
Pt c/o feeling weak today after her dialysis treatment; pt states she has been running a fever at home and having chills and a productive cough, pt states she has been coughing up yellow sputum

## 2019-03-28 NOTE — ED Provider Notes (Signed)
[facility] EMERGENCY DEPARTMENT Provider Note   CSN: [id number] Arrival date & time: 03/28/19  1603     History Chief Complaint  Patient presents with  . Weakness    Kathryn Beck is a 64 y.o. female.  Patient complains of fatigue cough and yellow sputum production.    mild fever  The history is provided by the patient. No language interpreter was used.  Weakness Severity:  Moderate Onset quality:  Sudden Timing:  Constant Progression:  Waxing and waning Chronicity:  New Context: not alcohol use   Relieved by:  Nothing Worsened by:  Nothing Ineffective treatments:  None tried Associated symptoms: cough   Associated symptoms: no abdominal pain, no chest pain, no diarrhea, no frequency, no headaches and no seizures        Past Medical History:  Diagnosis Date  . Acid reflux   . Anemia   . Arthritis   . Axillary masses    Soft tissue - status post excision  . Back pain   . Depression   . Diastolic heart failure ([place])   . End-stage renal disease ([place])    TTHSat  in [place]  . Essential hypertension   . History of blood transfusion   . History of claustrophobia   . History of pneumonia 2019  . Ischemic heart disease    Abnormal Myoview April 2018 - medical therapy  . Mixed hyperlipidemia   . Obesity   . Pancreatitis   . Peritoneal dialysis catheter in place [facility])   . Sleep apnea    Noncompliant with CPAP  . Type 2 diabetes mellitus [facility])     Patient Active Problem List   Diagnosis Date Noted  . Neck pain 03/23/2019  . Hypotension 12/05/2018  . Weakness 12/05/2018  . Near syncope 12/05/2018  . Peritoneal dialysis status ([place]) 10/31/2018  . Ischemic heart disease 09/25/2018  . Renal disease 08/22/2018  . Not currently working due to disabled status 06/13/2018  . ESRD on dialysis ([place]) 06/12/2018  . Hypertension associated with diabetes ([place]) 06/12/2018  . Depression, major, single episode, severe ([place]) 03/09/2018  . Adrenal mass, left ([place])  11/09/2017  . MGUS (monoclonal gammopathy of unknown significance) 11/05/2017  . Chronic diastolic CHF (congestive heart failure) ([place]) 09/20/2017  . Bilateral leg weakness 09/09/2017  . Adrenal adenoma 09/03/2017  . Type 2 diabetes mellitus with diabetic neuropathy, unspecified ([place]) 08/18/2017  . Unsteady gait 07/11/2017  . Recurrent falls 07/11/2017  . Anemia of chronic disease 03/24/2017  . History of colonic polyps 02/10/2017  . Dysphagia 11/05/2016  . IBS (irritable bowel syndrome) 02/04/2016  . Heat sensitivity 10/07/2014  . Cardiac murmur 01/17/2014  . Annual physical exam 01/15/2014  . Back pain with left-sided radiculopathy 09/18/2013  . Seasonal allergies 03/10/2013  . Lipoma of back 12/22/2012  . GERD (gastroesophageal reflux disease) 08/22/2012  . Sleep apnea 06/02/2011  . CKD (chronic kidney disease) stage 4, GFR 15-29 ml/min (HCC) 01/13/2011  . Neuropathy 04/16/2010  . FATIGUE 07/24/2009  . LIVER FUNCTION TESTS, ABNORMAL, HX OF 05/08/2009  . LUPUS ERYTHEMATOSUS, DISCOID 06/05/2008  . Arm pain 05/30/2008  . Mixed hyperlipidemia 06/07/2007  . Morbid obesity ([place]) 06/07/2007  . Malignant hypertension 06/07/2007    Past Surgical History:  Procedure Laterality Date  . ABDOMINAL HYSTERECTOMY    . AV FISTULA PLACEMENT Left 09/02/2017   Procedure: creation of left arm ARTERIOVENOUS (AV) FISTULA;  Surgeon: Serafina Mitchell, MD;  Location: [place];  Service: Vascular;  Laterality: Left;  .  COLONOSCOPY  2008   Dr. Oneida Alar: normal   . COLONOSCOPY N/A 12/18/2016   Dr. Oneida Alar: multiple tubular adenomas, internal hemorrhoids. Surveillance in 3 years   . ESOPHAGEAL DILATION N/A 10/13/2015   Procedure: ESOPHAGEAL DILATION;  Surgeon: Rogene Houston, MD;  [facility];  Service: Endoscopy;  Laterality: N/A;  . ESOPHAGOGASTRODUODENOSCOPY N/A 10/13/2015   Dr. Laural Golden: chronic gastritis on path, no H.pylori. Empiric dilation   . ESOPHAGOGASTRODUODENOSCOPY N/A 12/18/2016   Dr.  Oneida Alar: mild gastritis. BRAVO study revealed uncontrolled GERD. Dysphagia secondary to uncontrolled reflux  . FOOT SURGERY Bilateral    "nerve"    . LEFT HEART CATH AND CORONARY ANGIOGRAPHY N/A 12/29/2018   Procedure: LEFT HEART CATH AND CORONARY ANGIOGRAPHY;  Surgeon: Jettie Booze, MD;  Location: [place] [facility];  Service: Cardiovascular;  Laterality: N/A;  . LUNG BIOPSY    . MASS EXCISION Right 01/09/2013   Procedure: EXCISION OF NEOPLASM OF RIGHT  AXILLA  AND EXCISION OF NEOPLASM OF LEFT AXILLA;  Surgeon: Jamesetta So, MD;  Location: [facility];  Service: General;  Laterality: Right;  procedure end @ 08:23  . MYRINGOTOMY WITH TUBE PLACEMENT Bilateral 04/28/2017   Procedure: BILATERAL MYRINGOTOMY WITH TUBE PLACEMENT;  Surgeon: Leta Baptist, MD;  Location: [place];  Service: ENT;  Laterality: Bilateral;  . REVISION OF ARTERIOVENOUS GORETEX GRAFT Left 05/04/2018   Procedure: TRANSPOSITION OF CEPHALIC VEIN ARTERIOVENOUS FISTULA LEFT ARM;  Surgeon: Rosetta Posner, MD;  Location: [place];  Service: Vascular;  Laterality: Left;  . SAVORY DILATION N/A 12/18/2016   Procedure: SAVORY DILATION;  Surgeon: Danie Binder, MD;  [facility];  Service: Endoscopy;  Laterality: N/A;     OB History   No obstetric history on file.     Family History  Problem Relation Age of Onset  . Hypertension Father   . Hypercholesterolemia Father   . Arthritis Father   . Hypertension Sister   . Hypercholesterolemia Sister   . Breast cancer Sister   . Hypertension Sister   . Colon cancer Neg Hx   . Colon polyps Neg Hx     Social History   Tobacco Use  . Smoking status: Never Smoker  . Smokeless tobacco: Never Used  Substance Use Topics  . Alcohol use: No  . Drug use: No    Home Medications Prior to Admission medications   Medication Sig Start Date End Date Taking? Authorizing Provider  acetaminophen (TYLENOL) 500 MG tablet Take 1,000 mg by mouth every 8 (eight) hours as needed for mild pain  or fever.    Yes [provider]  allopurinol (ZYLOPRIM) 100 MG tablet Take 100 mg by mouth daily.   Yes [provider]  amLODipine (NORVASC) 10 MG tablet TAKE 1 TABLET BY MOUTH ONCE DAILY. Patient taking differently: Take 10 mg by mouth daily.  08/26/18  Yes Fayrene Helper, MD  cloNIDine (CATAPRES) 0.3 MG tablet Take 1 tablet at 6 AM take 1/2 tablet at 2 PM and take 1 tablet at 10 PM every day Patient taking differently: Take 0.3 mg by mouth See admin instructions. Take 0.3 mg at 6 AM take 0.15 mg at 2 PM and take 0.3 mg at 10 PM every day 10/27/18  Yes Fayrene Helper, MD  dexlansoprazole (DEXILANT) 60 MG capsule TAKE 1 CAPSULE IN THE MORNING WITH FOOD 02/09/19  Yes Fayrene Helper, MD  diclofenac Sodium (VOLTAREN) 1 % GEL Apply 2 g topically daily as needed (for  pain).  08/22/18  Yes [provider]  ezetimibe (ZETIA) 10 MG tablet TAKE 1 TABLET BY MOUTH ONCE DAILY. Patient taking differently: Take 10 mg by mouth daily.  12/16/18  Yes Fayrene Helper, MD  furosemide (LASIX) 80 MG tablet Take 80 mg by mouth 2 (two) times daily. 10/31/18  Yes [provider]  gabapentin (NEURONTIN) 300 MG capsule TAKE (1) CAPSULE BY MOUTH THREE TIMES DAILY Patient taking differently: Take 300 mg by mouth 3 (three) times daily.  12/19/18  Yes Fayrene Helper, MD  GNP ASPIRIN LOW DOSE 81 MG EC tablet TAKE 1 TABLET BY MOUTH ONCE DAILY. Patient taking differently: Take 81 mg by mouth daily.  06/09/18  Yes Satira Sark, MD  HYDROcodone-acetaminophen (NORCO/VICODIN) 5-325 MG tablet Take 1 tablet by mouth daily as needed for moderate pain.   Yes [provider]  Insulin Degludec (TRESIBA FLEXTOUCH) 200 UNIT/ML SOPN Inject 50-56 Units into the skin See admin instructions. For use after dialysis Patient taking differently: Inject 50-56 Units into the skin See admin instructions. Takes daily in the morning but takes after dialysis on Tuesdays Thursdays and  SaturdaysFor use after dialysis 02/03/19  Yes [name], MD  Insulin Lispro (HUMALOG KWIKPEN) 200 UNIT/ML SOPN Inject 24-28 Units into the skin 3 (three) times daily before meals. 01/31/19  Yes [name], MD  isosorbide mononitrate (IMDUR) 30 MG 24 hr tablet TAKE 1 TABLET BY MOUTH ONCE DAILY. Patient taking differently: Take 30 mg by mouth daily.  12/16/18  Yes Fayrene Helper, MD  ondansetron (ZOFRAN) 4 MG tablet Take 1 tablet (4 mg total) by mouth every 8 (eight) hours as needed for nausea or vomiting. 05/27/18  Yes Fayrene Helper, MD  sertraline (ZOLOFT) 50 MG tablet TAKE 1 TABLET BY MOUTH ONCE DAILY. Patient taking differently: Take 50 mg by mouth daily.  12/19/18  Yes Fayrene Helper, MD  [name] 500 MG chewable tablet Chew 500 mg by mouth See admin instructions. Take 500 mg with each snack and meal 11/07/18  Yes [provider]  zolpidem (AMBIEN) 10 MG tablet Take 10 mg by mouth at bedtime as needed for sleep.  11/17/18  Yes [provider]  Continuous Blood Gluc Sensor (FREESTYLE LIBRE 14 DAY SENSOR) MISC 1 each by Does not apply route every 14 (fourteen) days. Change every 2 weeks Patient not taking: Reported on 03/28/2019 01/31/19   [name], MD  metoprolol succinate (TOPROL-XL) 50 MG 24 hr tablet Take 1 tablet (50 mg total) by mouth daily. Take with or immediately following a meal. Patient not taking: Reported on 03/28/2019 03/23/19   Fayrene Helper, MD  FLUoxetine (PROZAC) 10 MG capsule Take 10 mg by mouth daily.    05/28/11  [provider]  glipiZIDE (GLUCOTROL) 10 MG tablet Take 10 mg by mouth 2 (two) times daily before a meal.    05/28/11  [provider]    Allergies    Ace inhibitors, Penicillins, Statins, and Albuterol  Review of Systems   Review of Systems  Constitutional: Negative for appetite change and fatigue.  HENT: Negative for congestion, ear discharge and sinus pressure.   Eyes: Negative for  discharge.  Respiratory: Positive for cough.   Cardiovascular: Negative for chest pain.  Gastrointestinal: Negative for abdominal pain and diarrhea.  Genitourinary: Negative for frequency and hematuria.  Musculoskeletal: Negative for back pain.  Skin: Negative for rash.  Neurological: Positive for weakness. Negative for seizures and headaches.  Psychiatric/Behavioral: Negative for hallucinations.  Physical Exam Updated Vital Signs BP (!) 142/59 (BP Location: Right Arm)   Pulse (!) 106   Temp (!) 101.9 F (38.8 C) (Oral)   Resp 16   Ht 5' (1.524 m)   Wt 76.7 kg   SpO2 100%   BMI 33.01 kg/m   Physical Exam Vitals and nursing note reviewed.  Constitutional:      Appearance: She is well-developed.  HENT:     Head: Normocephalic.     Nose: Nose normal.  Eyes:     General: No scleral icterus.    Conjunctiva/sclera: Conjunctivae normal.  Neck:     Thyroid: No thyromegaly.  Cardiovascular:     Rate and Rhythm: Normal rate and regular rhythm.     Heart sounds: No murmur. No friction rub. No gallop.   Pulmonary:     Breath sounds: No stridor. No wheezing or rales.  Chest:     Chest wall: No tenderness.  Abdominal:     General: There is no distension.     Tenderness: There is no abdominal tenderness. There is no rebound.  Musculoskeletal:        General: Normal range of motion.     Cervical back: Neck supple.  Lymphadenopathy:     Cervical: No cervical adenopathy.  Skin:    Findings: No erythema or rash.  Neurological:     Mental Status: She is oriented to person, place, and time.     Motor: No abnormal muscle tone.     Coordination: Coordination normal.  Psychiatric:        Behavior: Behavior normal.     ED Results / Procedures / Treatments   Labs (all labs ordered are listed, but only abnormal results are displayed) Labs Reviewed  BASIC METABOLIC PANEL - Abnormal; Notable for the following components:      Result Value   Chloride 97 (*)    BUN 38 (*)     Creatinine, Ser 5.71 (*)    Calcium 8.8 (*)    GFR calc non Af Amer 7 (*)    GFR calc Af Amer 9 (*)    All other components within normal limits  CBC - Abnormal; Notable for the following components:   Hemoglobin 9.7 (*)    HCT 32.9 (*)    MCH 23.8 (*)    MCHC 29.5 (*)    RDW 19.5 (*)    nRBC 1.2 (*)    All other components within normal limits  POC SARS CORONAVIRUS 2 AG -  ED - Abnormal; Notable for the following components:   SARS Coronavirus 2 Ag POSITIVE (*)    All other components within normal limits  CBG MONITORING, ED    EKG None  Radiology DG Chest Portable 1 View  Result Date: 03/28/2019 CLINICAL DATA:  Shortness of breath. EXAM: PORTABLE CHEST 1 VIEW COMPARISON:  December 05, 2018 FINDINGS: Mild, chronic appearing increased interstitial lung markings are seen. This is unchanged in appearance when compared to the prior study. Mild, stable linear scarring and/or atelectasis is seen within the left lung base. There is mild blunting of the left costophrenic angle. No pneumothorax is identified. The heart size and mediastinal contours are within normal limits. Degenerative changes seen throughout the thoracic spine. IMPRESSION: 1. Mild chronic appearing increased interstitial lung markings with mild stable left basilar linear scarring and/or atelectasis. Electronically Signed   By: Virgina Norfolk M.D.   On: 03/28/2019 21:31    Procedures Procedures (including critical care time)  Medications Ordered  in ED Medications  sodium chloride flush (NS) 0.9 % injection 3 mL (has no administration in time range)  acetaminophen (TYLENOL) tablet 650 mg (650 mg Oral Given 03/28/19 2137)    ED Course  I have reviewed the triage vital signs and the nursing notes.  Pertinent labs & imaging results that were available during my care of the patient were reviewed by me and considered in my medical decision making (see chart for details).    MDM Rules/Calculators/A&P                       Patient with positive Covid and fever but no hypoxia.  Patient will take Tylenol for fever and follow-up with her doctor if any problems with shortness of breath or worsening symptoms.  She will continue her dialysis Final Clinical Impression(s) / ED Diagnoses Final diagnoses:  COVID-19    Rx / DC Orders ED Discharge Orders    None       Milton Ferguson, MD 03/28/19 2212

## 2019-03-30 ENCOUNTER — Other Ambulatory Visit (HOSPITAL_COMMUNITY): Payer: 59

## 2019-03-30 ENCOUNTER — Ambulatory Visit (HOSPITAL_COMMUNITY): Payer: 59

## 2019-04-02 ENCOUNTER — Encounter (HOSPITAL_COMMUNITY): Payer: Self-pay

## 2019-04-02 ENCOUNTER — Other Ambulatory Visit: Payer: Self-pay

## 2019-04-02 ENCOUNTER — Emergency Department (HOSPITAL_COMMUNITY): Payer: 59

## 2019-04-02 ENCOUNTER — Inpatient Hospital Stay (HOSPITAL_COMMUNITY)
Admission: EM | Admit: 2019-04-02 | Discharge: 2019-04-07 | DRG: 177 | Disposition: A | Discharge status: Home / Self Care | Payer: 59 | Attending: Family Medicine | Admitting: Family Medicine

## 2019-04-02 DIAGNOSIS — I2510 Atherosclerotic heart disease of native coronary artery without angina pectoris: Secondary | ICD-10-CM | POA: Diagnosis present

## 2019-04-02 DIAGNOSIS — J1282 Pneumonia due to coronavirus disease 2019: Secondary | ICD-10-CM

## 2019-04-02 DIAGNOSIS — Z87892 Personal history of anaphylaxis: Secondary | ICD-10-CM

## 2019-04-02 DIAGNOSIS — E1140 Type 2 diabetes mellitus with diabetic neuropathy, unspecified: Secondary | ICD-10-CM | POA: Diagnosis present

## 2019-04-02 DIAGNOSIS — Z8701 Personal history of pneumonia (recurrent): Secondary | ICD-10-CM

## 2019-04-02 DIAGNOSIS — U071 COVID-19: Principal | ICD-10-CM | POA: Diagnosis present

## 2019-04-02 DIAGNOSIS — Z6839 Body mass index (BMI) 39.0-39.9, adult: Secondary | ICD-10-CM

## 2019-04-02 DIAGNOSIS — E1169 Type 2 diabetes mellitus with other specified complication: Secondary | ICD-10-CM | POA: Diagnosis present

## 2019-04-02 DIAGNOSIS — R0902 Hypoxemia: Secondary | ICD-10-CM | POA: Diagnosis present

## 2019-04-02 DIAGNOSIS — J9601 Acute respiratory failure with hypoxia: Secondary | ICD-10-CM | POA: Diagnosis present

## 2019-04-02 DIAGNOSIS — T380X5A Adverse effect of glucocorticoids and synthetic analogues, initial encounter: Secondary | ICD-10-CM | POA: Diagnosis present

## 2019-04-02 DIAGNOSIS — R197 Diarrhea, unspecified: Secondary | ICD-10-CM | POA: Diagnosis not present

## 2019-04-02 DIAGNOSIS — F329 Major depressive disorder, single episode, unspecified: Secondary | ICD-10-CM | POA: Diagnosis present

## 2019-04-02 DIAGNOSIS — F40240 Claustrophobia: Secondary | ICD-10-CM | POA: Diagnosis present

## 2019-04-02 DIAGNOSIS — G4733 Obstructive sleep apnea (adult) (pediatric): Secondary | ICD-10-CM | POA: Diagnosis present

## 2019-04-02 DIAGNOSIS — E1122 Type 2 diabetes mellitus with diabetic chronic kidney disease: Secondary | ICD-10-CM | POA: Diagnosis present

## 2019-04-02 DIAGNOSIS — Z992 Dependence on renal dialysis: Secondary | ICD-10-CM

## 2019-04-02 DIAGNOSIS — E1165 Type 2 diabetes mellitus with hyperglycemia: Secondary | ICD-10-CM | POA: Diagnosis present

## 2019-04-02 DIAGNOSIS — E782 Mixed hyperlipidemia: Secondary | ICD-10-CM | POA: Diagnosis present

## 2019-04-02 DIAGNOSIS — M1990 Unspecified osteoarthritis, unspecified site: Secondary | ICD-10-CM | POA: Diagnosis present

## 2019-04-02 DIAGNOSIS — I259 Chronic ischemic heart disease, unspecified: Secondary | ICD-10-CM | POA: Diagnosis not present

## 2019-04-02 DIAGNOSIS — D631 Anemia in chronic kidney disease: Secondary | ICD-10-CM | POA: Diagnosis present

## 2019-04-02 DIAGNOSIS — E1159 Type 2 diabetes mellitus with other circulatory complications: Secondary | ICD-10-CM | POA: Diagnosis present

## 2019-04-02 DIAGNOSIS — Z888 Allergy status to other drugs, medicaments and biological substances status: Secondary | ICD-10-CM

## 2019-04-02 DIAGNOSIS — E669 Obesity, unspecified: Secondary | ICD-10-CM | POA: Diagnosis present

## 2019-04-02 DIAGNOSIS — J302 Other seasonal allergic rhinitis: Secondary | ICD-10-CM | POA: Diagnosis present

## 2019-04-02 DIAGNOSIS — K219 Gastro-esophageal reflux disease without esophagitis: Secondary | ICD-10-CM | POA: Diagnosis present

## 2019-04-02 DIAGNOSIS — I5032 Chronic diastolic (congestive) heart failure: Secondary | ICD-10-CM | POA: Diagnosis present

## 2019-04-02 DIAGNOSIS — Z79899 Other long term (current) drug therapy: Secondary | ICD-10-CM

## 2019-04-02 DIAGNOSIS — I152 Hypertension secondary to endocrine disorders: Secondary | ICD-10-CM | POA: Diagnosis present

## 2019-04-02 DIAGNOSIS — I10 Essential (primary) hypertension: Secondary | ICD-10-CM | POA: Diagnosis not present

## 2019-04-02 DIAGNOSIS — Z880 Allergy status to penicillin: Secondary | ICD-10-CM

## 2019-04-02 DIAGNOSIS — Z794 Long term (current) use of insulin: Secondary | ICD-10-CM

## 2019-04-02 DIAGNOSIS — G629 Polyneuropathy, unspecified: Secondary | ICD-10-CM

## 2019-04-02 DIAGNOSIS — N186 End stage renal disease: Secondary | ICD-10-CM | POA: Diagnosis present

## 2019-04-02 DIAGNOSIS — I132 Hypertensive heart and chronic kidney disease with heart failure and with stage 5 chronic kidney disease, or end stage renal disease: Secondary | ICD-10-CM | POA: Diagnosis present

## 2019-04-02 DIAGNOSIS — R531 Weakness: Secondary | ICD-10-CM

## 2019-04-02 DIAGNOSIS — Z560 Unemployment, unspecified: Secondary | ICD-10-CM

## 2019-04-02 DIAGNOSIS — Z8249 Family history of ischemic heart disease and other diseases of the circulatory system: Secondary | ICD-10-CM

## 2019-04-02 DIAGNOSIS — G4730 Sleep apnea, unspecified: Secondary | ICD-10-CM | POA: Diagnosis present

## 2019-04-02 DIAGNOSIS — Y92230 Patient room in hospital as the place of occurrence of the external cause: Secondary | ICD-10-CM | POA: Diagnosis present

## 2019-04-02 DIAGNOSIS — Z8349 Family history of other endocrine, nutritional and metabolic diseases: Secondary | ICD-10-CM

## 2019-04-02 DIAGNOSIS — E785 Hyperlipidemia, unspecified: Secondary | ICD-10-CM | POA: Diagnosis present

## 2019-04-02 DIAGNOSIS — Z8261 Family history of arthritis: Secondary | ICD-10-CM

## 2019-04-02 HISTORY — DX: Pneumonia due to coronavirus disease 2019: J12.82

## 2019-04-02 HISTORY — DX: COVID-19: U07.1

## 2019-04-02 LAB — CBC WITH DIFFERENTIAL/PLATELET
Abs Immature Granulocytes: 0.05 10*3/uL (ref 0.00–0.07)
Basophils Absolute: 0 10*3/uL (ref 0.0–0.1)
Basophils Relative: 0 %
Eosinophils Absolute: 0 10*3/uL (ref 0.0–0.5)
Eosinophils Relative: 0 %
HCT: 31.6 % — ABNORMAL LOW (ref 36.0–46.0)
Hemoglobin: 9.1 g/dL — ABNORMAL LOW (ref 12.0–15.0)
Immature Granulocytes: 1 %
Lymphocytes Relative: 22 %
Lymphs Abs: 1.6 10*3/uL (ref 0.7–4.0)
MCH: 22.9 pg — ABNORMAL LOW (ref 26.0–34.0)
MCHC: 28.8 g/dL — ABNORMAL LOW (ref 30.0–36.0)
MCV: 79.4 fL — ABNORMAL LOW (ref 80.0–100.0)
Monocytes Absolute: 0.4 10*3/uL (ref 0.1–1.0)
Monocytes Relative: 6 %
Neutro Abs: 4.9 10*3/uL (ref 1.7–7.7)
Neutrophils Relative %: 71 %
Platelets: 290 10*3/uL (ref 150–400)
RBC: 3.98 MIL/uL (ref 3.87–5.11)
RDW: 18.5 % — ABNORMAL HIGH (ref 11.5–15.5)
WBC: 7 10*3/uL (ref 4.0–10.5)
nRBC: 1.4 % — ABNORMAL HIGH (ref 0.0–0.2)

## 2019-04-02 LAB — COMPREHENSIVE METABOLIC PANEL
ALT: 36 U/L (ref 0–44)
AST: 63 U/L — ABNORMAL HIGH (ref 15–41)
Albumin: 3.2 g/dL — ABNORMAL LOW (ref 3.5–5.0)
Alkaline Phosphatase: 99 U/L (ref 38–126)
Anion gap: 16 — ABNORMAL HIGH (ref 5–15)
BUN: 31 mg/dL — ABNORMAL HIGH (ref 8–23)
CO2: 26 mmol/L (ref 22–32)
Calcium: 7.9 mg/dL — ABNORMAL LOW (ref 8.9–10.3)
Chloride: 96 mmol/L — ABNORMAL LOW (ref 98–111)
Creatinine, Ser: 7.7 mg/dL — ABNORMAL HIGH (ref 0.44–1.00)
GFR calc Af Amer: 6 mL/min — ABNORMAL LOW (ref >60)
GFR calc non Af Amer: 5 mL/min — ABNORMAL LOW (ref >60)
Glucose, Bld: 117 mg/dL — ABNORMAL HIGH (ref 70–99)
Potassium: 4.3 mmol/L (ref 3.5–5.1)
Sodium: 138 mmol/L (ref 135–145)
Total Bilirubin: 0.5 mg/dL (ref 0.3–1.2)
Total Protein: 7.9 g/dL (ref 6.5–8.1)

## 2019-04-02 LAB — CBG MONITORING, ED
Glucose-Capillary: 172 mg/dL — ABNORMAL HIGH (ref 70–99)
Glucose-Capillary: 53 mg/dL — ABNORMAL LOW (ref 70–99)
Glucose-Capillary: 113 mg/dL — ABNORMAL HIGH (ref 70–99)

## 2019-04-02 MED ORDER — DEXTROSE 50 % IV SOLN
INTRAVENOUS | Status: AC
  Filled 2019-04-02: qty 50

## 2019-04-02 MED ORDER — SODIUM CHLORIDE 0.9 % IV SOLN
100.0000 mg | Freq: Every day | INTRAVENOUS | Status: DC
  Administered 2019-04-04 – 2019-04-07 (×3): 100 mg via INTRAVENOUS
  Filled 2019-04-02 (×4): qty 20
  Filled 2019-04-02: qty 100

## 2019-04-02 MED ORDER — SODIUM CHLORIDE 0.9 % IV SOLN
200.0000 mg | Freq: Once | INTRAVENOUS | Status: AC
  Administered 2019-04-03: 01:00:00 200 mg via INTRAVENOUS
  Filled 2019-04-02: qty 40

## 2019-04-02 NOTE — ED Provider Notes (Signed)
[facility] EMERGENCY DEPARTMENT Provider Note   CSN: [id number] Arrival date & time: 04/02/19  1825     History Chief Complaint  Patient presents with  . covid positive  . Weakness    Kathryn Beck is a 64 y.o. female.  Patient was diagnosed with Covid 19 5 days ago.  Patient complains of shortness of breath.  The history is provided by the patient. No language interpreter was used.  Weakness Severity:  Moderate Onset quality:  Sudden Timing:  Constant Progression:  Worsening Chronicity:  New Context: not alcohol use   Relieved by:  Nothing Worsened by:  Nothing Associated symptoms: no abdominal pain, no chest pain, no cough, no diarrhea, no frequency, no headaches and no seizures        Past Medical History:  Diagnosis Date  . Acid reflux   . Anemia   . Arthritis   . Axillary masses    Soft tissue - status post excision  . Back pain   . Depression   . Diastolic heart failure ([place])   . End-stage renal disease ([place])    TTHSat  in [place]  . Essential hypertension   . History of blood transfusion   . History of claustrophobia   . History of pneumonia 2019  . Ischemic heart disease    Abnormal Myoview April 2018 - medical therapy  . Mixed hyperlipidemia   . Obesity   . Pancreatitis   . Peritoneal dialysis catheter in place [facility])   . Sleep apnea    Noncompliant with CPAP  . Type 2 diabetes mellitus [facility])     Patient Active Problem List   Diagnosis Date Noted  . Neck pain 03/23/2019  . Hypotension 12/05/2018  . Weakness 12/05/2018  . Near syncope 12/05/2018  . Peritoneal dialysis status ([place]) 10/31/2018  . Ischemic heart disease 09/25/2018  . Renal disease 08/22/2018  . Not currently working due to disabled status 06/13/2018  . ESRD on dialysis ([place]) 06/12/2018  . Hypertension associated with diabetes ([place]) 06/12/2018  . Depression, major, single episode, severe ([place]) 03/09/2018  . Adrenal mass, left ([place]) 11/09/2017  . MGUS (monoclonal  gammopathy of unknown significance) 11/05/2017  . Chronic diastolic CHF (congestive heart failure) ([place]) 09/20/2017  . Bilateral leg weakness 09/09/2017  . Adrenal adenoma 09/03/2017  . Type 2 diabetes mellitus with diabetic neuropathy, unspecified ([place]) 08/18/2017  . Unsteady gait 07/11/2017  . Recurrent falls 07/11/2017  . Anemia of chronic disease 03/24/2017  . History of colonic polyps 02/10/2017  . Dysphagia 11/05/2016  . IBS (irritable bowel syndrome) 02/04/2016  . Heat sensitivity 10/07/2014  . Cardiac murmur 01/17/2014  . Annual physical exam 01/15/2014  . Back pain with left-sided radiculopathy 09/18/2013  . Seasonal allergies 03/10/2013  . Lipoma of back 12/22/2012  . GERD (gastroesophageal reflux disease) 08/22/2012  . Sleep apnea 06/02/2011  . CKD (chronic kidney disease) stage 4, GFR 15-29 ml/min (HCC) 01/13/2011  . Neuropathy 04/16/2010  . FATIGUE 07/24/2009  . LIVER FUNCTION TESTS, ABNORMAL, HX OF 05/08/2009  . LUPUS ERYTHEMATOSUS, DISCOID 06/05/2008  . Arm pain 05/30/2008  . Mixed hyperlipidemia 06/07/2007  . Morbid obesity ([place]) 06/07/2007  . Malignant hypertension 06/07/2007    Past Surgical History:  Procedure Laterality Date  . ABDOMINAL HYSTERECTOMY    . AV FISTULA PLACEMENT Left 09/02/2017   Procedure: creation of left arm ARTERIOVENOUS (AV) FISTULA;  Surgeon: Serafina Mitchell, MD;  Location: [place];  Service: Vascular;  Laterality: Left;  . COLONOSCOPY  2008  Dr. Oneida Alar: normal   . COLONOSCOPY N/A 12/18/2016   Dr. Oneida Alar: multiple tubular adenomas, internal hemorrhoids. Surveillance in 3 years   . ESOPHAGEAL DILATION N/A 10/13/2015   Procedure: ESOPHAGEAL DILATION;  Surgeon: Rogene Houston, MD;  [facility];  Service: Endoscopy;  Laterality: N/A;  . ESOPHAGOGASTRODUODENOSCOPY N/A 10/13/2015   Dr. Laural Golden: chronic gastritis on path, no H.pylori. Empiric dilation   . ESOPHAGOGASTRODUODENOSCOPY N/A 12/18/2016   Dr. Oneida Alar: mild gastritis. BRAVO  study revealed uncontrolled GERD. Dysphagia secondary to uncontrolled reflux  . FOOT SURGERY Bilateral    "nerve"    . LEFT HEART CATH AND CORONARY ANGIOGRAPHY N/A 12/29/2018   Procedure: LEFT HEART CATH AND CORONARY ANGIOGRAPHY;  Surgeon: Jettie Booze, MD;  Location: [place] [facility];  Service: Cardiovascular;  Laterality: N/A;  . LUNG BIOPSY    . MASS EXCISION Right 01/09/2013   Procedure: EXCISION OF NEOPLASM OF RIGHT  AXILLA  AND EXCISION OF NEOPLASM OF LEFT AXILLA;  Surgeon: Jamesetta So, MD;  Location: [facility];  Service: General;  Laterality: Right;  procedure end @ 08:23  . MYRINGOTOMY WITH TUBE PLACEMENT Bilateral 04/28/2017   Procedure: BILATERAL MYRINGOTOMY WITH TUBE PLACEMENT;  Surgeon: Leta Baptist, MD;  Location: [place];  Service: ENT;  Laterality: Bilateral;  . REVISION OF ARTERIOVENOUS GORETEX GRAFT Left 05/04/2018   Procedure: TRANSPOSITION OF CEPHALIC VEIN ARTERIOVENOUS FISTULA LEFT ARM;  Surgeon: Rosetta Posner, MD;  Location: [place];  Service: Vascular;  Laterality: Left;  . SAVORY DILATION N/A 12/18/2016   Procedure: SAVORY DILATION;  Surgeon: Danie Binder, MD;  [facility];  Service: Endoscopy;  Laterality: N/A;     OB History   No obstetric history on file.     Family History  Problem Relation Age of Onset  . Hypertension Father   . Hypercholesterolemia Father   . Arthritis Father   . Hypertension Sister   . Hypercholesterolemia Sister   . Breast cancer Sister   . Hypertension Sister   . Colon cancer Neg Hx   . Colon polyps Neg Hx     Social History   Tobacco Use  . Smoking status: Never Smoker  . Smokeless tobacco: Never Used  Substance Use Topics  . Alcohol use: No  . Drug use: No    Home Medications Prior to Admission medications   Medication Sig Start Date End Date Taking? Authorizing Provider  acetaminophen (TYLENOL) 500 MG tablet Take 1,000 mg by mouth every 8 (eight) hours as needed for mild pain or fever.    Yes [provider]  allopurinol (ZYLOPRIM) 100 MG tablet Take 100 mg by mouth daily.   Yes [provider]  amLODipine (NORVASC) 10 MG tablet TAKE 1 TABLET BY MOUTH ONCE DAILY. Patient taking differently: Take 10 mg by mouth daily.  08/26/18  Yes Fayrene Helper, MD  cloNIDine (CATAPRES) 0.3 MG tablet Take 1 tablet at 6 AM take 1/2 tablet at 2 PM and take 1 tablet at 10 PM every day Patient taking differently: Take 0.3 mg by mouth See admin instructions. Take 0.3 mg at 6 AM take 0.15 mg at 2 PM and take 0.3 mg at 10 PM every day 10/27/18  Yes Fayrene Helper, MD  Continuous Blood Gluc Sensor (FREESTYLE LIBRE 14 DAY SENSOR) MISC 1 each by Does not apply route every 14 (fourteen) days. Change every 2 weeks 01/31/19  Yes [name], MD  dexlansoprazole (DEXILANT) 60 MG capsule TAKE 1 CAPSULE  IN THE MORNING WITH FOOD 02/09/19  Yes Fayrene Helper, MD  diclofenac Sodium (VOLTAREN) 1 % GEL Apply 2 g topically daily as needed (for pain).  08/22/18  Yes [provider]  ezetimibe (ZETIA) 10 MG tablet TAKE 1 TABLET BY MOUTH ONCE DAILY. Patient taking differently: Take 10 mg by mouth daily.  12/16/18  Yes Fayrene Helper, MD  furosemide (LASIX) 80 MG tablet Take 80 mg by mouth 2 (two) times daily. 10/31/18  Yes [provider]  gabapentin (NEURONTIN) 300 MG capsule TAKE (1) CAPSULE BY MOUTH THREE TIMES DAILY Patient taking differently: Take 300 mg by mouth 3 (three) times daily.  12/19/18  Yes Fayrene Helper, MD  GNP ASPIRIN LOW DOSE 81 MG EC tablet TAKE 1 TABLET BY MOUTH ONCE DAILY. Patient taking differently: Take 81 mg by mouth daily.  06/09/18  Yes Satira Sark, MD  HYDROcodone-acetaminophen (NORCO/VICODIN) 5-325 MG tablet Take 1 tablet by mouth daily as needed for moderate pain.   Yes [provider]  Insulin Degludec (TRESIBA FLEXTOUCH) 200 UNIT/ML SOPN Inject 50-56 Units into the skin See admin instructions. For use after dialysis Patient taking  differently: Inject 50-56 Units into the skin See admin instructions. Takes daily in the morning but takes after dialysis on Tuesdays Thursdays and SaturdaysFor use after dialysis 02/03/19  Yes [name], MD  Insulin Lispro (HUMALOG KWIKPEN) 200 UNIT/ML SOPN Inject 24-28 Units into the skin 3 (three) times daily before meals. 01/31/19  Yes [name], MD  isosorbide mononitrate (IMDUR) 30 MG 24 hr tablet TAKE 1 TABLET BY MOUTH ONCE DAILY. Patient taking differently: Take 30 mg by mouth daily.  12/16/18  Yes Fayrene Helper, MD  ondansetron (ZOFRAN) 4 MG tablet Take 1 tablet (4 mg total) by mouth every 8 (eight) hours as needed for nausea or vomiting. 05/27/18  Yes Fayrene Helper, MD  sertraline (ZOLOFT) 50 MG tablet TAKE 1 TABLET BY MOUTH ONCE DAILY. Patient taking differently: Take 50 mg by mouth daily.  12/19/18  Yes Fayrene Helper, MD  [name] 500 MG chewable tablet Chew 500 mg by mouth See admin instructions. Take 500 mg with each snack and meal 11/07/18  Yes [provider]  zolpidem (AMBIEN) 10 MG tablet Take 10 mg by mouth at bedtime as needed for sleep.  11/17/18  Yes [provider]  metoprolol succinate (TOPROL-XL) 50 MG 24 hr tablet Take 1 tablet (50 mg total) by mouth daily. Take with or immediately following a meal. Patient not taking: Reported on 03/28/2019 03/23/19   Fayrene Helper, MD  FLUoxetine (PROZAC) 10 MG capsule Take 10 mg by mouth daily.    05/28/11  [provider]  glipiZIDE (GLUCOTROL) 10 MG tablet Take 10 mg by mouth 2 (two) times daily before a meal.    05/28/11  [provider]    Allergies    Ace inhibitors, Penicillins, Statins, and Albuterol  Review of Systems   Review of Systems  Constitutional: Negative for appetite change and fatigue.  HENT: Negative for congestion, ear discharge and sinus pressure.   Eyes: Negative for discharge.  Respiratory: Negative for cough.   Cardiovascular: Negative for  chest pain.  Gastrointestinal: Negative for abdominal pain and diarrhea.  Genitourinary: Negative for frequency and hematuria.  Musculoskeletal: Negative for back pain.  Skin: Negative for rash.  Neurological: Positive for weakness. Negative for seizures and headaches.  Psychiatric/Behavioral: Negative for hallucinations.    Physical Exam Updated Vital Signs BP 97/83  Pulse 83   Temp 100 F (37.8 C) (Oral)   Resp (!) 27   SpO2 95%   Physical Exam Vitals and nursing note reviewed.  Constitutional:      Appearance: She is well-developed.  HENT:     Head: Normocephalic.     Nose: Nose normal.  Eyes:     General: No scleral icterus.    Conjunctiva/sclera: Conjunctivae normal.  Neck:     Thyroid: No thyromegaly.  Cardiovascular:     Rate and Rhythm: Normal rate and regular rhythm.     Heart sounds: No murmur. No friction rub. No gallop.   Pulmonary:     Breath sounds: No stridor. No wheezing or rales.  Chest:     Chest wall: No tenderness.  Abdominal:     General: There is no distension.     Tenderness: There is no abdominal tenderness. There is no rebound.  Musculoskeletal:        General: Normal range of motion.     Cervical back: Neck supple.  Lymphadenopathy:     Cervical: No cervical adenopathy.  Skin:    Findings: No erythema or rash.  Neurological:     Mental Status: She is oriented to person, place, and time.     Motor: No abnormal muscle tone.     Coordination: Coordination normal.  Psychiatric:        Behavior: Behavior normal.     ED Results / Procedures / Treatments   Labs (all labs ordered are listed, but only abnormal results are displayed) Labs Reviewed  CBC WITH DIFFERENTIAL/PLATELET - Abnormal; Notable for the following components:      Result Value   Hemoglobin 9.1 (*)    HCT 31.6 (*)    MCV 79.4 (*)    MCH 22.9 (*)    MCHC 28.8 (*)    RDW 18.5 (*)    nRBC 1.4 (*)    All other components within normal limits  COMPREHENSIVE METABOLIC  PANEL - Abnormal; Notable for the following components:   Chloride 96 (*)    Glucose, Bld 117 (*)    BUN 31 (*)    Creatinine, Ser 7.70 (*)    Calcium 7.9 (*)    Albumin 3.2 (*)    AST 63 (*)    GFR calc non Af Amer 5 (*)    GFR calc Af Amer 6 (*)    Anion gap 16 (*)    All other components within normal limits  CBG MONITORING, ED - Abnormal; Notable for the following components:   Glucose-Capillary 53 (*)    All other components within normal limits  CBG MONITORING, ED - Abnormal; Notable for the following components:   Glucose-Capillary 172 (*)    All other components within normal limits  CBG MONITORING, ED - Abnormal; Notable for the following components:   Glucose-Capillary 113 (*)    All other components within normal limits    EKG EKG Interpretation  Date/Time:  Sunday April 02 2019 18:38:07 EST Ventricular Rate:  81 PR Interval:    QRS Duration: 82 QT Interval:  397 QTC Calculation: 461 R Axis:   72 Text Interpretation: Sinus rhythm Confirmed by Milton Ferguson [phone]) on 04/02/2019 9:33:09 PM   Radiology DG Chest Portable 1 View  Result Date: 04/02/2019 CLINICAL DATA:  COVID positive.  Weakness, shortness of breath EXAM: PORTABLE CHEST 1 VIEW COMPARISON:  03/28/2019 FINDINGS: Patchy peripheral bilateral airspace opacities compatible with pneumonia. Low lung volumes. No visible effusions or pneumothorax.  Heart is upper limits normal in size. No acute bony abnormality. IMPRESSION: Patchy peripheral bilateral airspace disease compatible with COVID pneumonia. Electronically Signed   By: Rolm Baptise M.D.   On: 04/02/2019 20:47    Procedures Procedures (including critical care time)  Medications Ordered in ED Medications  dextrose 50 % solution (  Given 04/02/19 1848)    ED Course  I have reviewed the triage vital signs and the nursing notes.  Pertinent labs & imaging results that were available during my care of the patient were reviewed by me and considered  in my medical decision making (see chart for details). PAUL TORPEY was evaluated in Emergency Department on 04/02/2019 for the symptoms described in the history of present illness. She was evaluated in the context of the global COVID-19 pandemic, which necessitated consideration that the patient might be at risk for infection with the SARS-CoV-2 virus that causes COVID-19. Institutional protocols and algorithms that pertain to the evaluation of patients at risk for COVID-19 are in a state of rapid change based on information released by regulatory bodies including the CDC and federal and state organizations. These policies and algorithms were followed during the patient's care in the ED.    MDM Rules/Calculators/A&P                     Patient has COVID-19 and room air sats are in the mid to upper 80s.  She will be admitted for treatment to the hospitalist service Final Clinical Impression(s) / ED Diagnoses Final diagnoses:  Hypoxia    Rx / DC Orders ED Discharge Orders    None       Milton Ferguson, MD 04/02/19 2218

## 2019-04-02 NOTE — ED Notes (Signed)
Pt family called by edp

## 2019-04-02 NOTE — ED Notes (Signed)
Pt O2 turned off for trial. Pt desat to 82% while asleep 88% after waking up. Oxygen turned back to 2l [place]. 93%

## 2019-04-02 NOTE — H&P (Signed)
TRH H&P    Patient Demographics:    Kathryn Beck, is a 64 y.o. female  MRN: [id number]  DOB - 1955/08/19  Admit Date - 04/02/2019  Referring MD/NP/PA: Dr. Roderic Palau  Outpatient Primary MD for the patient is Fayrene Helper, MD  Patient coming from: Home  Chief complaint-shortness of breath   HPI:    Kathryn Beck  is a 64 y.o. female, with history of hypertension, hyperlipidemia, type 2 diabetes mellitus, depression, chronic diastolic CHF, ESRD on peritoneal dialysis came to hospital with shortness of breath.  She was diagnosed with COVID-19 infection 5 days ago.  Patient is a poor historian, unable to provide any significant history.  She denies chest pain, has been coughing up phlegm.  She is on peritoneal dialysis at home. In the ED, lab work showed creatinine 7.70, BUN 31. Patient is requiring 2 L/min of oxygen via nasal cannula. Chest x-ray shows patchy bilateral bilateral airspace disease compatible with COVID-19 pneumonia    Review of systems:    In addition to the HPI above,    All other systems reviewed and are negative.    Past History of the following :    Past Medical History:  Diagnosis Date  . Acid reflux   . Anemia   . Arthritis   . Axillary masses    Soft tissue - status post excision  . Back pain   . Depression   . Diastolic heart failure ([place])   . End-stage renal disease ([place])    TTHSat  in [place]  . Essential hypertension   . History of blood transfusion   . History of claustrophobia   . History of pneumonia 2019  . Ischemic heart disease    Abnormal Myoview April 2018 - medical therapy  . Mixed hyperlipidemia   . Obesity   . Pancreatitis   . Peritoneal dialysis catheter in place [facility])   . Sleep apnea    Noncompliant with CPAP  . Type 2 diabetes mellitus ([place])       Past Surgical History:  Procedure Laterality Date  . ABDOMINAL HYSTERECTOMY    . AV  FISTULA PLACEMENT Left 09/02/2017   Procedure: creation of left arm ARTERIOVENOUS (AV) FISTULA;  Surgeon: Serafina Mitchell, MD;  Location: [facility] OR;  Service: Vascular;  Laterality: Left;  . COLONOSCOPY  2008   Dr. Oneida Alar: normal   . COLONOSCOPY N/A 12/18/2016   Dr. Oneida Alar: multiple tubular adenomas, internal hemorrhoids. Surveillance in 3 years   . ESOPHAGEAL DILATION N/A 10/13/2015   Procedure: ESOPHAGEAL DILATION;  Surgeon: Rogene Houston, MD;  [facility];  Service: Endoscopy;  Laterality: N/A;  . ESOPHAGOGASTRODUODENOSCOPY N/A 10/13/2015   Dr. Laural Golden: chronic gastritis on path, no H.pylori. Empiric dilation   . ESOPHAGOGASTRODUODENOSCOPY N/A 12/18/2016   Dr. Oneida Alar: mild gastritis. BRAVO study revealed uncontrolled GERD. Dysphagia secondary to uncontrolled reflux  . FOOT SURGERY Bilateral    "nerve"    . LEFT HEART CATH AND CORONARY ANGIOGRAPHY N/A 12/29/2018   Procedure: LEFT HEART CATH AND CORONARY ANGIOGRAPHY;  Surgeon:  Jettie Booze, MD;  Location: [place] CV LAB;  Service: Cardiovascular;  Laterality: N/A;  . LUNG BIOPSY    . MASS EXCISION Right 01/09/2013   Procedure: EXCISION OF NEOPLASM OF RIGHT  AXILLA  AND EXCISION OF NEOPLASM OF LEFT AXILLA;  Surgeon: Jamesetta So, MD;  Location: [facility] ORS;  Service: General;  Laterality: Right;  procedure end @ 08:23  . MYRINGOTOMY WITH TUBE PLACEMENT Bilateral 04/28/2017   Procedure: BILATERAL MYRINGOTOMY WITH TUBE PLACEMENT;  Surgeon: Leta Baptist, MD;  Location: [place];  Service: ENT;  Laterality: Bilateral;  . REVISION OF ARTERIOVENOUS GORETEX GRAFT Left 05/04/2018   Procedure: TRANSPOSITION OF CEPHALIC VEIN ARTERIOVENOUS FISTULA LEFT ARM;  Surgeon: Rosetta Posner, MD;  Location: [place];  Service: Vascular;  Laterality: Left;  . SAVORY DILATION N/A 12/18/2016   Procedure: SAVORY DILATION;  Surgeon: Danie Binder, MD;  [facility];  Service: Endoscopy;  Laterality: N/A;      Social History:      Social History    Tobacco Use  . Smoking status: Never Smoker  . Smokeless tobacco: Never Used  Substance Use Topics  . Alcohol use: No       Family History :     Family History  Problem Relation Age of Onset  . Hypertension Father   . Hypercholesterolemia Father   . Arthritis Father   . Hypertension Sister   . Hypercholesterolemia Sister   . Breast cancer Sister   . Hypertension Sister   . Colon cancer Neg Hx   . Colon polyps Neg Hx       Home Medications:   Prior to Admission medications   Medication Sig Start Date End Date Taking? Authorizing Provider  acetaminophen (TYLENOL) 500 MG tablet Take 1,000 mg by mouth every 8 (eight) hours as needed for mild pain or fever.    Yes [provider]  allopurinol (ZYLOPRIM) 100 MG tablet Take 100 mg by mouth daily.   Yes [provider]  amLODipine (NORVASC) 10 MG tablet TAKE 1 TABLET BY MOUTH ONCE DAILY. Patient taking differently: Take 10 mg by mouth daily.  08/26/18  Yes Fayrene Helper, MD  cloNIDine (CATAPRES) 0.3 MG tablet Take 1 tablet at 6 AM take 1/2 tablet at 2 PM and take 1 tablet at 10 PM every day Patient taking differently: Take 0.3 mg by mouth See admin instructions. Take 0.3 mg at 6 AM take 0.15 mg at 2 PM and take 0.3 mg at 10 PM every day 10/27/18  Yes Fayrene Helper, MD  Continuous Blood Gluc Sensor (FREESTYLE LIBRE 14 DAY SENSOR) MISC 1 each by Does not apply route every 14 (fourteen) days. Change every 2 weeks 01/31/19  Yes [name], MD  dexlansoprazole (DEXILANT) 60 MG capsule TAKE 1 CAPSULE IN THE MORNING WITH FOOD 02/09/19  Yes Fayrene Helper, MD  diclofenac Sodium (VOLTAREN) 1 % GEL Apply 2 g topically daily as needed (for pain).  08/22/18  Yes [provider]  ezetimibe (ZETIA) 10 MG tablet TAKE 1 TABLET BY MOUTH ONCE DAILY. Patient taking differently: Take 10 mg by mouth daily.  12/16/18  Yes Fayrene Helper, MD  furosemide (LASIX) 80 MG tablet Take 80 mg by mouth 2 (two)  times daily. 10/31/18  Yes [provider]  gabapentin (NEURONTIN) 300 MG capsule TAKE (1) CAPSULE BY MOUTH THREE TIMES DAILY Patient taking differently: Take 300 mg by mouth 3 (three) times daily.  12/19/18  Yes Tula Nakayama  E, MD  GNP ASPIRIN LOW DOSE 81 MG EC tablet TAKE 1 TABLET BY MOUTH ONCE DAILY. Patient taking differently: Take 81 mg by mouth daily.  06/09/18  Yes Satira Sark, MD  HYDROcodone-acetaminophen (NORCO/VICODIN) 5-325 MG tablet Take 1 tablet by mouth daily as needed for moderate pain.   Yes [provider]  Insulin Degludec (TRESIBA FLEXTOUCH) 200 UNIT/ML SOPN Inject 50-56 Units into the skin See admin instructions. For use after dialysis Patient taking differently: Inject 50-56 Units into the skin See admin instructions. Takes daily in the morning but takes after dialysis on Tuesdays Thursdays and SaturdaysFor use after dialysis 02/03/19  Yes [name], MD  Insulin Lispro (HUMALOG KWIKPEN) 200 UNIT/ML SOPN Inject 24-28 Units into the skin 3 (three) times daily before meals. 01/31/19  Yes [name], MD  isosorbide mononitrate (IMDUR) 30 MG 24 hr tablet TAKE 1 TABLET BY MOUTH ONCE DAILY. Patient taking differently: Take 30 mg by mouth daily.  12/16/18  Yes Fayrene Helper, MD  ondansetron (ZOFRAN) 4 MG tablet Take 1 tablet (4 mg total) by mouth every 8 (eight) hours as needed for nausea or vomiting. 05/27/18  Yes Fayrene Helper, MD  sertraline (ZOLOFT) 50 MG tablet TAKE 1 TABLET BY MOUTH ONCE DAILY. Patient taking differently: Take 50 mg by mouth daily.  12/19/18  Yes Fayrene Helper, MD  [name] 500 MG chewable tablet Chew 500 mg by mouth See admin instructions. Take 500 mg with each snack and meal 11/07/18  Yes [provider]  zolpidem (AMBIEN) 10 MG tablet Take 10 mg by mouth at bedtime as needed for sleep.  11/17/18  Yes [provider]  metoprolol succinate (TOPROL-XL) 50 MG 24 hr tablet Take 1 tablet (50 mg  total) by mouth daily. Take with or immediately following a meal. Patient not taking: Reported on 03/28/2019 03/23/19   Fayrene Helper, MD  FLUoxetine (PROZAC) 10 MG capsule Take 10 mg by mouth daily.    05/28/11  [provider]  glipiZIDE (GLUCOTROL) 10 MG tablet Take 10 mg by mouth 2 (two) times daily before a meal.    05/28/11  [provider]     Allergies:     Allergies  Allergen Reactions  . Ace Inhibitors Anaphylaxis and Swelling  . Penicillins Itching, Swelling and Other (See Comments)    Did it involve swelling of the face/tongue/throat, SOB, or low BP? Unknown Did it involve sudden or severe rash/hives, skin peeling, or any reaction on the inside of your mouth or nose? Unknown Did you need to seek medical attention at a hospital or doctor's office? Unknown When did it last happen?years  If all above answers are "NO", may proceed with cephalosporin use.   . Statins Other (See Comments)    elevated LFT's    . Albuterol Swelling     Physical Exam:   Vitals  Blood pressure (!) 104/54, pulse 86, temperature 100 F (37.8 C), temperature source Oral, resp. rate (!) 26, SpO2 95 %.  1.  General: Appears in no acute distress  2. Psychiatric: Somnolent but arousable, oriented x3  3. Neurologic: Moving all extremities, no focal deficit noted  4. HEENMT:  Atraumatic normocephalic, extraocular muscles are intact  5. Respiratory : Clear to auscultation bilaterally  6. Cardiovascular : S1-S2, regular, no murmur auscultated  7. Gastrointestinal:  Abdomen is soft, nontender, no organomegaly     Data Review:    CBC Recent Labs  Lab 03/28/19 1712 04/02/19 2031  WBC  8.4 7.0  HGB 9.7* 9.1*  HCT 32.9* 31.6*  PLT 298 290  MCV 80.6 79.4*  MCH 23.8* 22.9*  MCHC 29.5* 28.8*  RDW 19.5* 18.5*  LYMPHSABS  --  1.6  MONOABS  --  0.4  EOSABS  --  0.0  BASOSABS  --  0.0    ------------------------------------------------------------------------------------------------------------------  Results for orders placed or performed during the hospital encounter of 04/02/19 (from the past 48 hour(s))  CBG monitoring, ED     Status: Abnormal   Collection Time: 04/02/19  6:40 PM  Result Value Ref Range   Glucose-Capillary 53 (L) 70 - 99 mg/dL  CBG monitoring, ED     Status: Abnormal   Collection Time: 04/02/19  6:51 PM  Result Value Ref Range   Glucose-Capillary 172 (H) 70 - 99 mg/dL  CBG monitoring, ED     Status: Abnormal   Collection Time: 04/02/19  8:22 PM  Result Value Ref Range   Glucose-Capillary 113 (H) 70 - 99 mg/dL  CBC with Differential     Status: Abnormal   Collection Time: 04/02/19  8:31 PM  Result Value Ref Range   WBC 7.0 4.0 - 10.5 K/uL   RBC 3.98 3.87 - 5.11 MIL/uL   Hemoglobin 9.1 (L) 12.0 - 15.0 g/dL   HCT 31.6 (L) 36.0 - 46.0 %   MCV 79.4 (L) 80.0 - 100.0 fL   MCH 22.9 (L) 26.0 - 34.0 pg   MCHC 28.8 (L) 30.0 - 36.0 g/dL   RDW 18.5 (H) 11.5 - 15.5 %   Platelets 290 150 - 400 K/uL   nRBC 1.4 (H) 0.0 - 0.2 %   Neutrophils Relative % 71 %   Neutro Abs 4.9 1.7 - 7.7 K/uL   Lymphocytes Relative 22 %   Lymphs Abs 1.6 0.7 - 4.0 K/uL   Monocytes Relative 6 %   Monocytes Absolute 0.4 0.1 - 1.0 K/uL   Eosinophils Relative 0 %   Eosinophils Absolute 0.0 0.0 - 0.5 K/uL   Basophils Relative 0 %   Basophils Absolute 0.0 0.0 - 0.1 K/uL   Immature Granulocytes 1 %   Abs Immature Granulocytes 0.05 0.00 - 0.07 K/uL    Comment: Performed at [facility], [address], [place], [place] [address]  Comprehensive metabolic panel     Status: Abnormal   Collection Time: 04/02/19  8:31 PM  Result Value Ref Range   Sodium 138 135 - 145 mmol/L   Potassium 4.3 3.5 - 5.1 mmol/L   Chloride 96 (L) 98 - 111 mmol/L   CO2 26 22 - 32 mmol/L   Glucose, Bld 117 (H) 70 - 99 mg/dL   BUN 31 (H) 8 - 23 mg/dL   Creatinine, Ser 7.70 (H) 0.44 - 1.00 mg/dL    Calcium 7.9 (L) 8.9 - 10.3 mg/dL   Total Protein 7.9 6.5 - 8.1 g/dL   Albumin 3.2 (L) 3.5 - 5.0 g/dL   AST 63 (H) 15 - 41 U/L   ALT 36 0 - 44 U/L   Alkaline Phosphatase 99 38 - 126 U/L   Total Bilirubin 0.5 0.3 - 1.2 mg/dL   GFR calc non Af Amer 5 (L) >60 mL/min   GFR calc Af Amer 6 (L) >60 mL/min   Anion gap 16 (H) 5 - 15    Comment: Performed at [facility], [address] [place], [place] [address]    Chemistries  Recent Labs  Lab 03/28/19 1712 04/02/19 2031  NA 136 138  K 3.7 4.3  CL 97* 96*  CO2 24 26  GLUCOSE 72 117*  BUN 38* 31*  CREATININE 5.71* 7.70*  CALCIUM 8.8* 7.9*  AST  --  63*  ALT  --  36  ALKPHOS  --  99  BILITOT  --  0.5   ------------------------------------------------------------------------------------------------------------------  ------------------------------------------------------------------------------------------------------------------ GFR: Estimated Creatinine Clearance: 6.9 mL/min (A) (by C-G formula based on SCr of 7.7 mg/dL (H)). Liver Function Tests: Recent Labs  Lab 04/02/19 2031  AST 63*  ALT 36  ALKPHOS 99  BILITOT 0.5  PROT 7.9  ALBUMIN 3.2*    CBG: Recent Labs  Lab 04/02/19 1840 04/02/19 1851 04/02/19 2022  GLUCAP 53* 172* 113*    --------------------------------------------------------------------------------------------------------------- Urine analysis:    Component Value Date/Time   COLORURINE YELLOW 11/07/2017 2340   APPEARANCEUR HAZY (A) 11/07/2017 2340   LABSPEC 1.017 11/07/2017 2340   PHURINE 5.0 11/07/2017 2340   GLUCOSEU 150 (A) 11/07/2017 2340   HGBUR NEGATIVE 11/07/2017 2340   BILIRUBINUR NEGATIVE 11/07/2017 2340   BILIRUBINUR neg 02/15/2017 0913   KETONESUR NEGATIVE 11/07/2017 2340   PROTEINUR >=300 (A) 11/07/2017 2340   UROBILINOGEN 0.2 02/15/2017 0913   NITRITE NEGATIVE 11/07/2017 2340   LEUKOCYTESUR NEGATIVE 11/07/2017 2340      Imaging Results:    DG Chest Portable 1  View  Result Date: 04/02/2019 CLINICAL DATA:  COVID positive.  Weakness, shortness of breath EXAM: PORTABLE CHEST 1 VIEW COMPARISON:  03/28/2019 FINDINGS: Patchy peripheral bilateral airspace opacities compatible with pneumonia. Low lung volumes. No visible effusions or pneumothorax. Heart is upper limits normal in size. No acute bony abnormality. IMPRESSION: Patchy peripheral bilateral airspace disease compatible with COVID pneumonia. Electronically Signed   By: Rolm Baptise M.D.   On: 04/02/2019 20:47    My personal review of EKG: Rhythm NSR, no ST changes   Assessment & Plan:    Active Problems:   Pneumonia due to COVID-19 virus   1. Acute hypoxic respiratory failure due to SARS COVID-19 pneumonia-patient is currently requiring 2 L/min of oxygen, chest x-ray shows patchy peripheral bilateral airspace disease.  Will start remdesivir per pharmacy consultation, Decadron 6 mg p.o. daily.  Will obtain inflammatory markers, CRP, D-dimer, ferritin.  Follow inflammatory markers in a.m.  2. ESRD on peritoneal dialysis-patient will be transferred to [facility], called and discussed with Dr. Joelyn Oms from nephrology.  He will see patient in a.m.  Will hold Lasix.  3. History of hypertension-patient is on multiple antihypertensive medications including Catapres, amlodipine.  Will hold these medications as patient blood pressure is soft.  Consider restarting medications once blood pressure starts rising.   DVT Prophylaxis-   Heparin  AM Labs Ordered, also please review Full Orders  Family Communication: Admission, patients condition and plan of care including tests being ordered have been discussed with the patient  who indicate understanding and agree with the plan and Code Status.  Code Status: Full code  Admission status: Inpatient: Based on patients clinical presentation and evaluation of above clinical data, I have made determination that patient meets Inpatient criteria at this  time.  Time spent in minutes : 60 minutes   Raney Antwine S Pantelis Elgersma M.D

## 2019-04-02 NOTE — ED Notes (Signed)
Dr Roderic Palau in to see

## 2019-04-02 NOTE — ED Notes (Signed)
CBG 53 AMP D 50 adm

## 2019-04-02 NOTE — ED Notes (Signed)
Pt CBG 172. Pt alert and talking at this time

## 2019-04-02 NOTE — ED Triage Notes (Addendum)
Pt dx with covid on Tuesday 03/28/19. Pt has since been weak, not eating, fever up to 103. EMS report pt sat 84% on RA up to 95% on 2 l/min via [place]. Per EMS pt was talking up until she was brought into the room. CBG 75 . Pt is on peritoneal diaylsis

## 2019-04-03 ENCOUNTER — Other Ambulatory Visit: Payer: Self-pay

## 2019-04-03 DIAGNOSIS — R0902 Hypoxemia: Secondary | ICD-10-CM

## 2019-04-03 DIAGNOSIS — I259 Chronic ischemic heart disease, unspecified: Secondary | ICD-10-CM

## 2019-04-03 DIAGNOSIS — Z992 Dependence on renal dialysis: Secondary | ICD-10-CM

## 2019-04-03 DIAGNOSIS — N186 End stage renal disease: Secondary | ICD-10-CM

## 2019-04-03 DIAGNOSIS — E1159 Type 2 diabetes mellitus with other circulatory complications: Secondary | ICD-10-CM

## 2019-04-03 DIAGNOSIS — E782 Mixed hyperlipidemia: Secondary | ICD-10-CM

## 2019-04-03 DIAGNOSIS — K219 Gastro-esophageal reflux disease without esophagitis: Secondary | ICD-10-CM

## 2019-04-03 DIAGNOSIS — I10 Essential (primary) hypertension: Secondary | ICD-10-CM

## 2019-04-03 HISTORY — DX: Hypoxemia: R09.02

## 2019-04-03 LAB — HIV ANTIBODY (ROUTINE TESTING W REFLEX): HIV Screen 4th Generation wRfx: NONREACTIVE

## 2019-04-03 LAB — CBC WITH DIFFERENTIAL/PLATELET
Abs Immature Granulocytes: 0.04 10*3/uL (ref 0.00–0.07)
Basophils Absolute: 0 10*3/uL (ref 0.0–0.1)
Basophils Relative: 0 %
Eosinophils Absolute: 0 10*3/uL (ref 0.0–0.5)
Eosinophils Relative: 0 %
HCT: 30.5 % — ABNORMAL LOW (ref 36.0–46.0)
Hemoglobin: 8.9 g/dL — ABNORMAL LOW (ref 12.0–15.0)
Immature Granulocytes: 1 %
Lymphocytes Relative: 13 %
Lymphs Abs: 1.2 10*3/uL (ref 0.7–4.0)
MCH: 23.2 pg — ABNORMAL LOW (ref 26.0–34.0)
MCHC: 29.2 g/dL — ABNORMAL LOW (ref 30.0–36.0)
MCV: 79.6 fL — ABNORMAL LOW (ref 80.0–100.0)
Monocytes Absolute: 0.4 10*3/uL (ref 0.1–1.0)
Monocytes Relative: 5 %
Neutro Abs: 7.1 10*3/uL (ref 1.7–7.7)
Neutrophils Relative %: 81 %
Platelets: 307 10*3/uL (ref 150–400)
RBC: 3.83 MIL/uL — ABNORMAL LOW (ref 3.87–5.11)
RDW: 18.5 % — ABNORMAL HIGH (ref 11.5–15.5)
WBC: 8.7 10*3/uL (ref 4.0–10.5)
nRBC: 0.8 % — ABNORMAL HIGH (ref 0.0–0.2)

## 2019-04-03 LAB — COMPREHENSIVE METABOLIC PANEL
ALT: 37 U/L (ref 0–44)
AST: 62 U/L — ABNORMAL HIGH (ref 15–41)
Albumin: 3 g/dL — ABNORMAL LOW (ref 3.5–5.0)
Alkaline Phosphatase: 108 U/L (ref 38–126)
Anion gap: 20 — ABNORMAL HIGH (ref 5–15)
BUN: 38 mg/dL — ABNORMAL HIGH (ref 8–23)
CO2: 24 mmol/L (ref 22–32)
Calcium: 8.2 mg/dL — ABNORMAL LOW (ref 8.9–10.3)
Chloride: 95 mmol/L — ABNORMAL LOW (ref 98–111)
Creatinine, Ser: 8.45 mg/dL — ABNORMAL HIGH (ref 0.44–1.00)
GFR calc Af Amer: 5 mL/min — ABNORMAL LOW (ref >60)
GFR calc non Af Amer: 5 mL/min — ABNORMAL LOW (ref >60)
Glucose, Bld: 208 mg/dL — ABNORMAL HIGH (ref 70–99)
Potassium: 4.4 mmol/L (ref 3.5–5.1)
Sodium: 139 mmol/L (ref 135–145)
Total Bilirubin: 0.6 mg/dL (ref 0.3–1.2)
Total Protein: 7.9 g/dL (ref 6.5–8.1)

## 2019-04-03 LAB — GLUCOSE, CAPILLARY
Glucose-Capillary: 421 mg/dL — ABNORMAL HIGH (ref 70–99)
Glucose-Capillary: 180 mg/dL — ABNORMAL HIGH (ref 70–99)
Glucose-Capillary: 151 mg/dL — ABNORMAL HIGH (ref 70–99)

## 2019-04-03 LAB — FERRITIN: Ferritin: 1499 ng/mL — ABNORMAL HIGH (ref 11–307)

## 2019-04-03 LAB — PROCALCITONIN: Procalcitonin: 3.98 ng/mL

## 2019-04-03 LAB — BRAIN NATRIURETIC PEPTIDE: B Natriuretic Peptide: 45 pg/mL (ref 0.0–100.0)

## 2019-04-03 LAB — CBG MONITORING, ED: Glucose-Capillary: 258 mg/dL — ABNORMAL HIGH (ref 70–99)

## 2019-04-03 LAB — HEPATITIS B SURFACE ANTIGEN: Hepatitis B Surface Ag: NONREACTIVE

## 2019-04-03 LAB — D-DIMER, QUANTITATIVE
D-Dimer, Quant: 1.48 ug/mL-FEU — ABNORMAL HIGH (ref 0.00–0.50)
D-Dimer, Quant: 1.65 ug/mL-FEU — ABNORMAL HIGH (ref 0.00–0.50)

## 2019-04-03 LAB — C-REACTIVE PROTEIN: CRP: 30.7 mg/dL — ABNORMAL HIGH (ref <1.0)

## 2019-04-03 LAB — HEMOGLOBIN A1C
Hgb A1c MFr Bld: 8.7 % — ABNORMAL HIGH (ref 4.8–5.6)
Mean Plasma Glucose: 202.99 mg/dL

## 2019-04-03 LAB — ABO/RH: ABO/RH(D): B POS

## 2019-04-03 MED ORDER — INSULIN GLARGINE 100 UNIT/ML ~~LOC~~ SOLN
50.0000 [IU] | Freq: Every day | SUBCUTANEOUS | Status: DC
  Administered 2019-04-03 – 2019-04-04 (×2): 50 [IU] via SUBCUTANEOUS
  Filled 2019-04-03 (×4): qty 0.5

## 2019-04-03 MED ORDER — LIDOCAINE-PRILOCAINE 2.5-2.5 % EX CREA: 1.0000 "application " | TOPICAL_CREAM | CUTANEOUS | Status: DC | PRN

## 2019-04-03 MED ORDER — SERTRALINE HCL 50 MG PO TABS
50.0000 mg | ORAL_TABLET | Freq: Every day | ORAL | Status: DC
  Administered 2019-04-03 – 2019-04-07 (×5): 50 mg via ORAL
  Filled 2019-04-03 (×5): qty 1

## 2019-04-03 MED ORDER — PENTAFLUOROPROP-TETRAFLUOROETH EX AERO: 1.0000 "application " | INHALATION_SPRAY | CUTANEOUS | Status: DC | PRN

## 2019-04-03 MED ORDER — PANTOPRAZOLE SODIUM 40 MG PO TBEC
40.0000 mg | DELAYED_RELEASE_TABLET | Freq: Every day | ORAL | Status: DC
  Administered 2019-04-03 – 2019-04-07 (×5): 40 mg via ORAL
  Filled 2019-04-03 (×5): qty 1

## 2019-04-03 MED ORDER — CHLORHEXIDINE GLUCONATE CLOTH 2 % EX PADS
6.0000 | MEDICATED_PAD | Freq: Every day | CUTANEOUS | Status: DC
  Administered 2019-04-04 – 2019-04-07 (×2): 6 via TOPICAL

## 2019-04-03 MED ORDER — HEPARIN SODIUM (PORCINE) 5000 UNIT/ML IJ SOLN
5000.0000 [IU] | Freq: Three times a day (TID) | INTRAMUSCULAR | Status: DC
  Administered 2019-04-03 – 2019-04-07 (×9): 5000 [IU] via SUBCUTANEOUS
  Filled 2019-04-03 (×7): qty 1

## 2019-04-03 MED ORDER — SODIUM CHLORIDE 0.9% FLUSH
3.0000 mL | Freq: Two times a day (BID) | INTRAVENOUS | Status: DC
  Administered 2019-04-03 – 2019-04-07 (×9): 3 mL via INTRAVENOUS

## 2019-04-03 MED ORDER — INSULIN ASPART 100 UNIT/ML ~~LOC~~ SOLN
14.0000 [IU] | Freq: Once | SUBCUTANEOUS | Status: AC
  Administered 2019-04-03: 14 [IU] via SUBCUTANEOUS

## 2019-04-03 MED ORDER — INSULIN ASPART 100 UNIT/ML ~~LOC~~ SOLN
10.0000 [IU] | Freq: Three times a day (TID) | SUBCUTANEOUS | Status: DC
  Administered 2019-04-04 – 2019-04-07 (×7): 10 [IU] via SUBCUTANEOUS

## 2019-04-03 MED ORDER — SODIUM CHLORIDE 0.9 % IV SOLN: 100.0000 mL | INTRAVENOUS | Status: DC | PRN

## 2019-04-03 MED ORDER — DEXAMETHASONE SODIUM PHOSPHATE 10 MG/ML IJ SOLN
6.0000 mg | INTRAMUSCULAR | Status: DC
  Administered 2019-04-03 – 2019-04-07 (×5): 6 mg via INTRAVENOUS
  Filled 2019-04-03 (×5): qty 1

## 2019-04-03 MED ORDER — SODIUM CHLORIDE 0.9 % IV SOLN: 250.0000 mL | INTRAVENOUS | Status: DC | PRN

## 2019-04-03 MED ORDER — ACETAMINOPHEN 500 MG PO TABS: 1000.0000 mg | ORAL_TABLET | Freq: Three times a day (TID) | ORAL | Status: DC | PRN

## 2019-04-03 MED ORDER — SODIUM CHLORIDE 0.9 % IV SOLN: 100.0000 mg | Freq: Every day | INTRAVENOUS | Status: DC

## 2019-04-03 MED ORDER — ONDANSETRON HCL 4 MG/2ML IJ SOLN: 4.0000 mg | Freq: Four times a day (QID) | INTRAMUSCULAR | Status: DC | PRN

## 2019-04-03 MED ORDER — SUCROFERRIC OXYHYDROXIDE 500 MG PO CHEW
500.0000 mg | CHEWABLE_TABLET | Freq: Three times a day (TID) | ORAL | Status: DC
  Administered 2019-04-03 – 2019-04-07 (×9): 500 mg via ORAL
  Filled 2019-04-03 (×23): qty 1

## 2019-04-03 MED ORDER — ORAL CARE MOUTH RINSE
15.0000 mL | Freq: Two times a day (BID) | OROMUCOSAL | Status: DC
  Administered 2019-04-03 – 2019-04-07 (×7): 15 mL via OROMUCOSAL

## 2019-04-03 MED ORDER — ISOSORBIDE MONONITRATE ER 60 MG PO TB24
30.0000 mg | ORAL_TABLET | Freq: Every day | ORAL | Status: DC
  Administered 2019-04-03 – 2019-04-07 (×4): 30 mg via ORAL
  Filled 2019-04-03 (×8): qty 1

## 2019-04-03 MED ORDER — ALLOPURINOL 100 MG PO TABS
100.0000 mg | ORAL_TABLET | Freq: Every day | ORAL | Status: DC
  Administered 2019-04-03 – 2019-04-07 (×5): 100 mg via ORAL
  Filled 2019-04-03 (×8): qty 1

## 2019-04-03 MED ORDER — LIDOCAINE HCL (PF) 1 % IJ SOLN: 5.0000 mL | INTRAMUSCULAR | Status: DC | PRN

## 2019-04-03 MED ORDER — INSULIN ASPART 100 UNIT/ML ~~LOC~~ SOLN
0.0000 [IU] | Freq: Three times a day (TID) | SUBCUTANEOUS | Status: DC
  Administered 2019-04-03: 09:00:00 5 [IU] via SUBCUTANEOUS
  Administered 2019-04-03: 18:00:00 2 [IU] via SUBCUTANEOUS
  Administered 2019-04-04: 5 [IU] via SUBCUTANEOUS
  Administered 2019-04-04: 17:00:00 3 [IU] via SUBCUTANEOUS
  Administered 2019-04-04: 5 [IU] via SUBCUTANEOUS
  Administered 2019-04-05: 3 [IU] via SUBCUTANEOUS
  Administered 2019-04-05: 5 [IU] via SUBCUTANEOUS
  Administered 2019-04-06 (×3): 2 [IU] via SUBCUTANEOUS
  Administered 2019-04-07: 3 [IU] via SUBCUTANEOUS
  Administered 2019-04-07: 09:00:00 2 [IU] via SUBCUTANEOUS
  Filled 2019-04-03: qty 1

## 2019-04-03 MED ORDER — INSULIN GLARGINE 100 UNIT/ML ~~LOC~~ SOLN
50.0000 [IU] | SUBCUTANEOUS | Status: DC
  Filled 2019-04-03: qty 0.5

## 2019-04-03 MED ORDER — SODIUM CHLORIDE 0.9 % IV SOLN: 200.0000 mg | Freq: Once | INTRAVENOUS | Status: DC

## 2019-04-03 MED ORDER — SODIUM CHLORIDE 0.9% FLUSH: 3.0000 mL | INTRAVENOUS | Status: DC | PRN

## 2019-04-03 MED ORDER — ONDANSETRON HCL 4 MG PO TABS: 4.0000 mg | ORAL_TABLET | Freq: Four times a day (QID) | ORAL | Status: DC | PRN

## 2019-04-03 NOTE — ED Notes (Signed)
ED TO INPATIENT HANDOFF REPORT  ED Nurse Name and Phone #: [phone]  S Name/Age/Gender Kathryn Beck 64 y.o. female Room/Bed: APA11/APA11  Code Status   Code Status: Full Code  Home/SNF/Other Home Patient oriented to: self, place, time and situation Is this baseline? Yes   Triage Complete: Triage complete  Chief Complaint Pneumonia due to COVID-19 virus [U07.1, J12.82]  Triage Note Pt dx with covid on Tuesday 03/28/19. Pt has since been weak, not eating, fever up to 103. EMS report pt sat 84% on RA up to 95% on 2 l/min via [place]. Per EMS pt was talking up until she was brought into the room. CBG 75 . Pt is on peritoneal diaylsis    Allergies Allergies  Allergen Reactions  . Ace Inhibitors Anaphylaxis and Swelling  . Penicillins Itching, Swelling and Other (See Comments)    Did it involve swelling of the face/tongue/throat, SOB, or low BP? Unknown Did it involve sudden or severe rash/hives, skin peeling, or any reaction on the inside of your mouth or nose? Unknown Did you need to seek medical attention at a hospital or doctor's office? Unknown When did it last happen?years  If all above answers are "NO", may proceed with cephalosporin use.   . Statins Other (See Comments)    elevated LFT's    . Albuterol Swelling    Level of Care/Admitting Diagnosis ED Disposition    ED Disposition [facility]: [facility] [[id number]]  Level of Care: Med-Surg [16]  Covid Evaluation: Confirmed COVID Positive  Diagnosis: Pneumonia due to COVID-19 virus [[id number]]  Admitting Physician: [place], [place]  Attending Physician: Murlean Iba [[id number]]  Estimated length of stay: past midnight tomorrow  Certification:: I certify this patient will need inpatient services for at least 2 midnights       B Medical/Surgery History Past Medical History:  Diagnosis Date  . Acid reflux   . Anemia   . Arthritis   . Axillary masses    Soft tissue - status post excision  . Back pain   . Depression   . Diastolic heart failure ([place])   . End-stage renal disease ([place])    TTHSat  in [place]  . Essential hypertension   . History of blood transfusion   . History of claustrophobia   . History of pneumonia 2019  . Ischemic heart disease    Abnormal Myoview April 2018 - medical therapy  . Mixed hyperlipidemia   . Obesity   . Pancreatitis   . Peritoneal dialysis catheter in place [facility])   . Sleep apnea    Noncompliant with CPAP  . Type 2 diabetes mellitus ([place])    Past Surgical History:  Procedure Laterality Date  . ABDOMINAL HYSTERECTOMY    . AV FISTULA PLACEMENT Left 09/02/2017   Procedure: creation of left arm ARTERIOVENOUS (AV) FISTULA;  Surgeon: Serafina Mitchell, MD;  Location: [facility] OR;  Service: Vascular;  Laterality: Left;  . COLONOSCOPY  2008   Dr. Oneida Alar: normal   . COLONOSCOPY N/A 12/18/2016   Dr. Oneida Alar: multiple tubular adenomas, internal hemorrhoids. Surveillance in 3 years   . ESOPHAGEAL DILATION N/A 10/13/2015   Procedure: ESOPHAGEAL DILATION;  Surgeon: Rogene Houston, MD;  [facility];  Service: Endoscopy;  Laterality: N/A;  . ESOPHAGOGASTRODUODENOSCOPY N/A 10/13/2015   Dr. Laural Golden: chronic gastritis on path, no H.pylori. Empiric dilation   . ESOPHAGOGASTRODUODENOSCOPY N/A 12/18/2016   Dr. Oneida Alar: mild gastritis. BRAVO study revealed  uncontrolled GERD. Dysphagia secondary to uncontrolled reflux  . FOOT SURGERY Bilateral    "nerve"    . LEFT HEART CATH AND CORONARY ANGIOGRAPHY N/A 12/29/2018   Procedure: LEFT HEART CATH AND CORONARY ANGIOGRAPHY;  Surgeon: Jettie Booze, MD;  Location: [place] CV LAB;  Service: Cardiovascular;  Laterality: N/A;  . LUNG BIOPSY    . MASS EXCISION Right 01/09/2013   Procedure: EXCISION OF NEOPLASM OF RIGHT  AXILLA  AND EXCISION OF NEOPLASM OF LEFT AXILLA;  Surgeon: Jamesetta So, MD;  Location: [facility] ORS;  Service: General;  Laterality: Right;  procedure end @  08:23  . MYRINGOTOMY WITH TUBE PLACEMENT Bilateral 04/28/2017   Procedure: BILATERAL MYRINGOTOMY WITH TUBE PLACEMENT;  Surgeon: Leta Baptist, MD;  Location: [place];  Service: ENT;  Laterality: Bilateral;  . REVISION OF ARTERIOVENOUS GORETEX GRAFT Left 05/04/2018   Procedure: TRANSPOSITION OF CEPHALIC VEIN ARTERIOVENOUS FISTULA LEFT ARM;  Surgeon: Rosetta Posner, MD;  Location: [place];  Service: Vascular;  Laterality: Left;  . SAVORY DILATION N/A 12/18/2016   Procedure: SAVORY DILATION;  Surgeon: Danie Binder, MD;  [facility];  Service: Endoscopy;  Laterality: N/A;     A IV Location/Drains/Wounds Patient Lines/Drains/Airways Status   Active Line/Drains/Airways    Name:   Placement date:   Placement time:   Site:   Days:   Peripheral IV 04/02/19 Right Hand   04/02/19    1847    Hand   1   Fistula / Graft Left Upper arm Arteriovenous fistula   09/02/17    1150    Upper arm   578          Intake/Output Last 24 hours  Intake/Output Summary (Last 24 hours) at 04/03/2019 1114 Last data filed at 04/03/2019 0253 Gross per 24 hour  Intake 250 ml  Output -  Net 250 ml    Labs/Imaging Results for orders placed or performed during the hospital encounter of 04/02/19 (from the past 48 hour(s))  CBG monitoring, ED     Status: Abnormal   Collection Time: 04/02/19  6:40 PM  Result Value Ref Range   Glucose-Capillary 53 (L) 70 - 99 mg/dL  CBG monitoring, ED     Status: Abnormal   Collection Time: 04/02/19  6:51 PM  Result Value Ref Range   Glucose-Capillary 172 (H) 70 - 99 mg/dL  CBG monitoring, ED     Status: Abnormal   Collection Time: 04/02/19  8:22 PM  Result Value Ref Range   Glucose-Capillary 113 (H) 70 - 99 mg/dL  CBC with Differential     Status: Abnormal   Collection Time: 04/02/19  8:31 PM  Result Value Ref Range   WBC 7.0 4.0 - 10.5 K/uL   RBC 3.98 3.87 - 5.11 MIL/uL   Hemoglobin 9.1 (L) 12.0 - 15.0 g/dL   HCT 31.6 (L) 36.0 - 46.0 %   MCV 79.4 (L) 80.0 - 100.0 fL   MCH  22.9 (L) 26.0 - 34.0 pg   MCHC 28.8 (L) 30.0 - 36.0 g/dL   RDW 18.5 (H) 11.5 - 15.5 %   Platelets 290 150 - 400 K/uL   nRBC 1.4 (H) 0.0 - 0.2 %   Neutrophils Relative % 71 %   Neutro Abs 4.9 1.7 - 7.7 K/uL   Lymphocytes Relative 22 %   Lymphs Abs 1.6 0.7 - 4.0 K/uL   Monocytes Relative 6 %   Monocytes Absolute 0.4 0.1 - 1.0 K/uL   Eosinophils  Relative 0 %   Eosinophils Absolute 0.0 0.0 - 0.5 K/uL   Basophils Relative 0 %   Basophils Absolute 0.0 0.0 - 0.1 K/uL   Immature Granulocytes 1 %   Abs Immature Granulocytes 0.05 0.00 - 0.07 K/uL    Comment: Performed at [facility], [address], [place], [place] [address]  Comprehensive metabolic panel     Status: Abnormal   Collection Time: 04/02/19  8:31 PM  Result Value Ref Range   Sodium 138 135 - 145 mmol/L   Potassium 4.3 3.5 - 5.1 mmol/L   Chloride 96 (L) 98 - 111 mmol/L   CO2 26 22 - 32 mmol/L   Glucose, Bld 117 (H) 70 - 99 mg/dL   BUN 31 (H) 8 - 23 mg/dL   Creatinine, Ser 7.70 (H) 0.44 - 1.00 mg/dL   Calcium 7.9 (L) 8.9 - 10.3 mg/dL   Total Protein 7.9 6.5 - 8.1 g/dL   Albumin 3.2 (L) 3.5 - 5.0 g/dL   AST 63 (H) 15 - 41 U/L   ALT 36 0 - 44 U/L   Alkaline Phosphatase 99 38 - 126 U/L   Total Bilirubin 0.5 0.3 - 1.2 mg/dL   GFR calc non Af Amer 5 (L) >60 mL/min   GFR calc Af Amer 6 (L) >60 mL/min   Anion gap 16 (H) 5 - 15    Comment: Performed at [facility], [address] [place], [place] [address]  D-dimer, quantitative (not at [facility])     Status: Abnormal   Collection Time: 04/02/19  8:31 PM  Result Value Ref Range   D-Dimer, Quant 1.48 (H) 0.00 - 0.50 ug/mL-FEU    Comment: (NOTE) At the manufacturer cut-off of 0.50 ug/mL FEU, this assay has been documented to exclude PE with a sensitivity and negative predictive value of 97 to 99%.  At this time, this assay has not been approved by the FDA to exclude DVT/VTE. Results should be correlated with clinical presentation. Performed at [facility], [address]  [place], [place] [address]   Hemoglobin A1c     Status: Abnormal   Collection Time: 04/02/19  8:31 PM  Result Value Ref Range   Hgb A1c MFr Bld 8.7 (H) 4.8 - 5.6 %    Comment: (NOTE) Pre diabetes:          5.7%-6.4% Diabetes:              >6.4% Glycemic control for   <7.0% adults with diabetes    Mean Plasma Glucose 202.99 mg/dL    Comment: Performed at [facility] [address] [place], [place] [address]  C-reactive protein     Status: Abnormal   Collection Time: 04/03/19  3:51 AM  Result Value Ref Range   CRP 30.7 (H) <1.0 mg/dL    Comment: Performed at [facility], [address] [place], [place] [address]  Ferritin     Status: Abnormal   Collection Time: 04/03/19  3:51 AM  Result Value Ref Range   Ferritin 1,499 (H) 11 - 307 ng/mL    Comment: Performed at [facility], [address] [place], [place] [address]  HIV Antibody (routine testing w rflx)     Status: None   Collection Time: 04/03/19  3:51 AM  Result Value Ref Range   HIV Screen 4th Generation wRfx NON REACTIVE NON REACTIVE    Comment: Performed at [facility], [place] [address], [place], [place] [address]  CBC with Differential/Platelet  Status: Abnormal   Collection Time: 04/03/19  3:51 AM  Result Value Ref Range   WBC 8.7 4.0 - 10.5 K/uL   RBC 3.83 (L) 3.87 - 5.11 MIL/uL   Hemoglobin 8.9 (L) 12.0 - 15.0 g/dL   HCT 30.5 (L) 36.0 - 46.0 %   MCV 79.6 (L) 80.0 - 100.0 fL   MCH 23.2 (L) 26.0 - 34.0 pg   MCHC 29.2 (L) 30.0 - 36.0 g/dL   RDW 18.5 (H) 11.5 - 15.5 %   Platelets 307 150 - 400 K/uL   nRBC 0.8 (H) 0.0 - 0.2 %   Neutrophils Relative % 81 %   Neutro Abs 7.1 1.7 - 7.7 K/uL   Lymphocytes Relative 13 %   Lymphs Abs 1.2 0.7 - 4.0 K/uL   Monocytes Relative 5 %   Monocytes Absolute 0.4 0.1 - 1.0 K/uL   Eosinophils Relative 0 %   Eosinophils Absolute 0.0 0.0 - 0.5 K/uL   Basophils Relative 0 %   Basophils Absolute 0.0 0.0 - 0.1 K/uL   Immature Granulocytes 1 %   Abs Immature Granulocytes  0.04 0.00 - 0.07 K/uL    Comment: Performed at [facility], [address], [place], [place] [address]  Comprehensive metabolic panel     Status: Abnormal   Collection Time: 04/03/19  3:51 AM  Result Value Ref Range   Sodium 139 135 - 145 mmol/L   Potassium 4.4 3.5 - 5.1 mmol/L   Chloride 95 (L) 98 - 111 mmol/L   CO2 24 22 - 32 mmol/L   Glucose, Bld 208 (H) 70 - 99 mg/dL   BUN 38 (H) 8 - 23 mg/dL   Creatinine, Ser 8.45 (H) 0.44 - 1.00 mg/dL   Calcium 8.2 (L) 8.9 - 10.3 mg/dL   Total Protein 7.9 6.5 - 8.1 g/dL   Albumin 3.0 (L) 3.5 - 5.0 g/dL   AST 62 (H) 15 - 41 U/L   ALT 37 0 - 44 U/L   Alkaline Phosphatase 108 38 - 126 U/L   Total Bilirubin 0.6 0.3 - 1.2 mg/dL   GFR calc non Af Amer 5 (L) >60 mL/min   GFR calc Af Amer 5 (L) >60 mL/min   Anion gap 20 (H) 5 - 15    Comment: Performed at [facility], [address] [place], [place] [address]  D-dimer, quantitative (not at [facility])     Status: Abnormal   Collection Time: 04/03/19  3:51 AM  Result Value Ref Range   D-Dimer, Quant 1.65 (H) 0.00 - 0.50 ug/mL-FEU    Comment: (NOTE) At the manufacturer cut-off of 0.50 ug/mL FEU, this assay has been documented to exclude PE with a sensitivity and negative predictive value of 97 to 99%.  At this time, this assay has not been approved by the FDA to exclude DVT/VTE. Results should be correlated with clinical presentation. Performed at [facility], [address] [place], [place] [address]   Brain natriuretic peptide     Status: None   Collection Time: 04/03/19  3:51 AM  Result Value Ref Range   B Natriuretic Peptide 45.0 0.0 - 100.0 pg/mL    Comment: Performed at [facility], [address], [place], [place] [address]  Procalcitonin     Status: None   Collection Time: 04/03/19  3:51 AM  Result Value Ref Range   Procalcitonin 3.98 ng/mL    Comment:        Interpretation: PCT > 2 ng/mL: Systemic infection (sepsis) is likely,  unless other causes are known. (NOTE)       Sepsis PCT  Algorithm           Lower Respiratory Tract                                      Infection PCT Algorithm    ----------------------------     ----------------------------         PCT < 0.25 ng/mL                PCT < 0.10 ng/mL         Strongly encourage             Strongly discourage   discontinuation of antibiotics    initiation of antibiotics    ----------------------------     -----------------------------       PCT 0.25 - 0.50 ng/mL            PCT 0.10 - 0.25 ng/mL               OR       >80% decrease in PCT            Discourage initiation of                                            antibiotics      Encourage discontinuation           of antibiotics    ----------------------------     -----------------------------         PCT >= 0.50 ng/mL              PCT 0.26 - 0.50 ng/mL               AND       <80% decrease in PCT              Encourage initiation of                                             antibiotics       Encourage continuation           of antibiotics    ----------------------------     -----------------------------        PCT >= 0.50 ng/mL                  PCT > 0.50 ng/mL               AND         increase in PCT                  Strongly encourage                                      initiation of antibiotics    Strongly encourage escalation           of antibiotics                                     -----------------------------  PCT <= 0.25 ng/mL                                                 OR                                        > 80% decrease in PCT                                     Discontinue / Do not initiate                                             antibiotics Performed at [facility], [address] [place], [place] [address]   ABO/Rh     Status: None   Collection Time: 04/03/19  3:52 AM  Result Value Ref Range   ABO/RH(D)      B POS Performed at [facility], [address], [place],  [place] [address]   CBG monitoring, ED     Status: Abnormal   Collection Time: 04/03/19  8:20 AM  Result Value Ref Range   Glucose-Capillary 258 (H) 70 - 99 mg/dL   DG Chest Portable 1 View  Result Date: 04/02/2019 CLINICAL DATA:  COVID positive.  Weakness, shortness of breath EXAM: PORTABLE CHEST 1 VIEW COMPARISON:  03/28/2019 FINDINGS: Patchy peripheral bilateral airspace opacities compatible with pneumonia. Low lung volumes. No visible effusions or pneumothorax. Heart is upper limits normal in size. No acute bony abnormality. IMPRESSION: Patchy peripheral bilateral airspace disease compatible with COVID pneumonia. Electronically Signed   By: Rolm Baptise M.D.   On: 04/02/2019 20:47    Pending Labs Unresulted Labs (From admission, onward)    Start     Ordered   04/03/19 0500  CBC with Differential/Platelet  Daily,   R     04/03/19 0058   04/03/19 0500  Comprehensive metabolic panel  Daily,   R     04/03/19 0058   04/03/19 0500  C-reactive protein  Daily,   R     04/03/19 0058   04/03/19 0500  D-dimer, quantitative (not at [facility])  Daily,   R     04/03/19 0058   04/03/19 0500  Ferritin  Daily,   R     04/03/19 0058          Vitals/Pain Today's Vitals   04/03/19 0913 04/03/19 0930 04/03/19 1000 04/03/19 1030  BP: 125/66 117/71 140/61 (!) 129/56  Pulse:  81 91 84  Resp:  (!) 22 12 (!) 23  Temp:      TempSrc:      SpO2:  90% 92% 93%  PainSc:        Isolation Precautions Airborne and Contact precautions  Medications Medications  acetaminophen (TYLENOL) tablet 1,000 mg (has no administration in time range)  allopurinol (ZYLOPRIM) tablet 100 mg (has no administration in time range)  isosorbide mononitrate (IMDUR) 24 hr tablet 30 mg (has no administration in time range)  sertraline (ZOLOFT) tablet 50 mg (50 mg Oral Given 04/03/19 0909)  insulin glargine (LANTUS) injection  50 Units (has no administration in time range)  pantoprazole (PROTONIX) EC tablet 40 mg (40 mg Oral Given  04/03/19 0909)  heparin injection 5,000 Units (5,000 Units Subcutaneous Not Given 04/03/19 0528)  sodium chloride flush (NS) 0.9 % injection 3 mL (3 mLs Intravenous Given 04/03/19 0115)  sodium chloride flush (NS) 0.9 % injection 3 mL (has no administration in time range)  0.9 %  sodium chloride infusion (has no administration in time range)  ondansetron (ZOFRAN) tablet 4 mg (has no administration in time range)    Or  ondansetron (ZOFRAN) injection 4 mg (has no administration in time range)  dexamethasone (DECADRON) injection 6 mg (6 mg Intravenous Given 04/03/19 0114)  sucroferric oxyhydroxide ([name]) chewable tablet 500 mg (has no administration in time range)  remdesivir 200 mg in sodium chloride 0.9% 250 mL IVPB (0 mg Intravenous Stopped 04/03/19 0253)    Followed by  remdesivir 100 mg in sodium chloride 0.9 % 100 mL IVPB (has no administration in time range)  insulin aspart (novoLOG) injection 0-9 Units (5 Units Subcutaneous Given 04/03/19 0910)  Chlorhexidine Gluconate Cloth 2 % PADS 6 each (has no administration in time range)  dextrose 50 % solution (  Given 04/02/19 1848)    Mobility walks High fall risk   Focused Assessments   R Recommendations: See Admitting Provider Note  Report given to:   Additional Notes:

## 2019-04-03 NOTE — ED Notes (Signed)
Report given to [employer] [place] at [facility]

## 2019-04-03 NOTE — Procedures (Signed)
    HEMODIALYSIS TREATMENT NOTE:  3 hour heparin-free treatment completed via left upper arm AVF (15g/antegrade).  Unable to tolerate removal of 2 liters as ordered.  Pt is well below her EDW.  Had two episodes of hypotension requiring NS bolus.  Net UF 945cc.  All blood was returned and hemostasis was achieved in 15 minutes.  Outpatient HD orders from [facility]: [facility] MWF 4h (has been attending [facility] on TTS since last week) EDW 96kg 2K 2.5Ca LUE AVF 15g 400/600 Heparin infusion 1000u load + 1000u hourly Max UFR 1.5L/hr Epogen 8000u tiw   Rockwell [place], RN

## 2019-04-03 NOTE — Consult Note (Signed)
Kathryn Beck Admit Date: 04/02/2019 04/03/2019 Rexene Agent Requesting Physician:  Darrick Meigs MD  Reason for Consult:  ESRD, COVID19 Infection HPI:  43F presented with SOB yesterday, recent dx of COVID 19.  Has ESRD and she tells me was on PD but transitioned to HD recently due to catheter malfunction.  SHe is Kathryn Beck.  More details not immediately available.  She has LUE AVF +B/T.  Started on remdesivir and dexamethasone.  Req 3.5L . BP stable  Labs stable K 4.4, HCO3 24.  Hb 8.9.    She is groggy at this time  PMH Incudes:  DM2  HTN  OSA   Creat (mg/dL)  Date Value  08/23/2017 3.74 (H)  11/25/2016 2.63 (H)  11/16/2016 2.39 (H)  06/09/2016 2.43 (H)  02/28/2016 1.91 (H)  01/23/2016 2.51 (H)  01/14/2016 1.94 (H)  10/07/2015 2.14 (H)  07/23/2015 1.37 (H)  04/25/2015 1.97 (H)   Creatinine, Ser (mg/dL)  Date Value  04/03/2019 8.45 (H)  04/02/2019 7.70 (H)  03/28/2019 5.71 (H)  02/23/2019 4.06 (H)  12/29/2018 4.56 (H)  12/05/2018 3.93 (H)  09/12/2018 5.09 (H)  09/03/2018 6.00 (H)  04/22/2018 3.49 (H)  02/06/2018 4.69 (H)  ]  ROS Balance of 12 systems is negative w/ exceptions as above  PMH  Past Medical History:  Diagnosis Date  . Acid reflux   . Anemia   . Arthritis   . Axillary masses    Soft tissue - status post excision  . Back pain   . Depression   . Diastolic heart failure ([place])   . End-stage renal disease ([place])    TTHSat  in [place]  . Essential hypertension   . History of blood transfusion   . History of claustrophobia   . History of pneumonia 2019  . Ischemic heart disease    Abnormal Myoview April 2018 - medical therapy  . Mixed hyperlipidemia   . Obesity   . Pancreatitis   . Peritoneal dialysis catheter in place [facility])   . Sleep apnea    Noncompliant with CPAP  . Type 2 diabetes mellitus (HCC)    PSH  Past Surgical History:  Procedure Laterality Date  . ABDOMINAL HYSTERECTOMY    . AV FISTULA PLACEMENT Left 09/02/2017   Procedure: creation of left arm ARTERIOVENOUS (AV) FISTULA;  Surgeon: Serafina Mitchell, MD;  Location: [facility] OR;  Service: Vascular;  Laterality: Left;  . COLONOSCOPY  2008   Dr. Oneida Alar: normal   . COLONOSCOPY N/A 12/18/2016   Dr. Oneida Alar: multiple tubular adenomas, internal hemorrhoids. Surveillance in 3 years   . ESOPHAGEAL DILATION N/A 10/13/2015   Procedure: ESOPHAGEAL DILATION;  Surgeon: Rogene Houston, MD;  [facility];  Service: Endoscopy;  Laterality: N/A;  . ESOPHAGOGASTRODUODENOSCOPY N/A 10/13/2015   Dr. Laural Golden: chronic gastritis on path, no H.pylori. Empiric dilation   . ESOPHAGOGASTRODUODENOSCOPY N/A 12/18/2016   Dr. Oneida Alar: mild gastritis. BRAVO study revealed uncontrolled GERD. Dysphagia secondary to uncontrolled reflux  . FOOT SURGERY Bilateral    "nerve"    . LEFT HEART CATH AND CORONARY ANGIOGRAPHY N/A 12/29/2018   Procedure: LEFT HEART CATH AND CORONARY ANGIOGRAPHY;  Surgeon: Jettie Booze, MD;  Location: [place] CV LAB;  Service: Cardiovascular;  Laterality: N/A;  . LUNG BIOPSY    . MASS EXCISION Right 01/09/2013   Procedure: EXCISION OF NEOPLASM OF RIGHT  AXILLA  AND EXCISION OF NEOPLASM OF LEFT AXILLA;  Surgeon: Jamesetta So, MD;  Location: [facility] ORS;  Service:  General;  Laterality: Right;  procedure end @ 08:23  . MYRINGOTOMY WITH TUBE PLACEMENT Bilateral 04/28/2017   Procedure: BILATERAL MYRINGOTOMY WITH TUBE PLACEMENT;  Surgeon: Leta Baptist, MD;  Location: [facility];  Service: ENT;  Laterality: Bilateral;  . REVISION OF ARTERIOVENOUS GORETEX GRAFT Left 05/04/2018   Procedure: TRANSPOSITION OF CEPHALIC VEIN ARTERIOVENOUS FISTULA LEFT ARM;  Surgeon: Rosetta Posner, MD;  Location: [place];  Service: Vascular;  Laterality: Left;  . SAVORY DILATION N/A 12/18/2016   Procedure: SAVORY DILATION;  Surgeon: Danie Binder, MD;  [facility];  Service: Endoscopy;  Laterality: N/A;   FH  Family History  Problem Relation Age of Onset  . Hypertension Father   .  Hypercholesterolemia Father   . Arthritis Father   . Hypertension Sister   . Hypercholesterolemia Sister   . Breast cancer Sister   . Hypertension Sister   . Colon cancer Neg Hx   . Colon polyps Neg Hx    SH  reports that she has never smoked. She has never used smokeless tobacco. She reports that she does not drink alcohol or use drugs. Allergies  Allergies  Allergen Reactions  . Ace Inhibitors Anaphylaxis and Swelling  . Penicillins Itching, Swelling and Other (See Comments)    Did it involve swelling of the face/tongue/throat, SOB, or low BP? Unknown Did it involve sudden or severe rash/hives, skin peeling, or any reaction on the inside of your mouth or nose? Unknown Did you need to seek medical attention at a hospital or doctor's office? Unknown When did it last happen?years  If all above answers are "NO", may proceed with cephalosporin use.   . Statins Other (See Comments)    elevated LFT's    . Albuterol Swelling   Home medications Prior to Admission medications   Medication Sig Start Date End Date Taking? Authorizing Provider  acetaminophen (TYLENOL) 500 MG tablet Take 1,000 mg by mouth every 8 (eight) hours as needed for mild pain or fever.    Yes [provider]  allopurinol (ZYLOPRIM) 100 MG tablet Take 100 mg by mouth daily.   Yes [provider]  amLODipine (NORVASC) 10 MG tablet TAKE 1 TABLET BY MOUTH ONCE DAILY. Patient taking differently: Take 10 mg by mouth daily.  08/26/18  Yes Fayrene Helper, MD  cloNIDine (CATAPRES) 0.3 MG tablet Take 1 tablet at 6 AM take 1/2 tablet at 2 PM and take 1 tablet at 10 PM every day Patient taking differently: Take 0.3 mg by mouth See admin instructions. Take 0.3 mg at 6 AM take 0.15 mg at 2 PM and take 0.3 mg at 10 PM every day 10/27/18  Yes Fayrene Helper, MD  Continuous Blood Gluc Sensor (FREESTYLE LIBRE 14 DAY SENSOR) MISC 1 each by Does not apply route every 14 (fourteen) days. Change every 2 weeks  01/31/19  Yes [name], MD  dexlansoprazole (DEXILANT) 60 MG capsule TAKE 1 CAPSULE IN THE MORNING WITH FOOD 02/09/19  Yes Fayrene Helper, MD  diclofenac Sodium (VOLTAREN) 1 % GEL Apply 2 g topically daily as needed (for pain).  08/22/18  Yes [provider]  ezetimibe (ZETIA) 10 MG tablet TAKE 1 TABLET BY MOUTH ONCE DAILY. Patient taking differently: Take 10 mg by mouth daily.  12/16/18  Yes Fayrene Helper, MD  furosemide (LASIX) 80 MG tablet Take 80 mg by mouth 2 (two) times daily. 10/31/18  Yes [provider]  gabapentin (NEURONTIN) 300 MG capsule TAKE (1) CAPSULE  BY MOUTH THREE TIMES DAILY Patient taking differently: Take 300 mg by mouth 3 (three) times daily.  12/19/18  Yes Fayrene Helper, MD  GNP ASPIRIN LOW DOSE 81 MG EC tablet TAKE 1 TABLET BY MOUTH ONCE DAILY. Patient taking differently: Take 81 mg by mouth daily.  06/09/18  Yes Satira Sark, MD  HYDROcodone-acetaminophen (NORCO/VICODIN) 5-325 MG tablet Take 1 tablet by mouth daily as needed for moderate pain.   Yes [provider]  Insulin Degludec (TRESIBA FLEXTOUCH) 200 UNIT/ML SOPN Inject 50-56 Units into the skin See admin instructions. For use after dialysis Patient taking differently: Inject 50-56 Units into the skin See admin instructions. Takes daily in the morning but takes after dialysis on Tuesdays Thursdays and SaturdaysFor use after dialysis 02/03/19  Yes [name], MD  Insulin Lispro (HUMALOG KWIKPEN) 200 UNIT/ML SOPN Inject 24-28 Units into the skin 3 (three) times daily before meals. 01/31/19  Yes [name], MD  isosorbide mononitrate (IMDUR) 30 MG 24 hr tablet TAKE 1 TABLET BY MOUTH ONCE DAILY. Patient taking differently: Take 30 mg by mouth daily.  12/16/18  Yes Fayrene Helper, MD  ondansetron (ZOFRAN) 4 MG tablet Take 1 tablet (4 mg total) by mouth every 8 (eight) hours as needed for nausea or vomiting. 05/27/18  Yes Fayrene Helper, MD  sertraline  (ZOLOFT) 50 MG tablet TAKE 1 TABLET BY MOUTH ONCE DAILY. Patient taking differently: Take 50 mg by mouth daily.  12/19/18  Yes Fayrene Helper, MD  [name] 500 MG chewable tablet Chew 500 mg by mouth See admin instructions. Take 500 mg with each snack and meal 11/07/18  Yes [provider]  zolpidem (AMBIEN) 10 MG tablet Take 10 mg by mouth at bedtime as needed for sleep.  11/17/18  Yes [provider]  metoprolol succinate (TOPROL-XL) 50 MG 24 hr tablet Take 1 tablet (50 mg total) by mouth daily. Take with or immediately following a meal. Patient not taking: Reported on 03/28/2019 03/23/19   Fayrene Helper, MD  FLUoxetine (PROZAC) 10 MG capsule Take 10 mg by mouth daily.    05/28/11  [provider]  glipiZIDE (GLUCOTROL) 10 MG tablet Take 10 mg by mouth 2 (two) times daily before a meal.    05/28/11  [provider]    Current Medications Scheduled Meds: . allopurinol  100 mg Oral Daily  . dexamethasone (DECADRON) injection  6 mg Intravenous Q24H  . heparin  5,000 Units Subcutaneous Q8H  . insulin aspart  0-9 Units Subcutaneous TID WC  . insulin glargine  50 Units Subcutaneous See admin instructions  . isosorbide mononitrate  30 mg Oral Daily  . pantoprazole  40 mg Oral Daily  . sertraline  50 mg Oral Daily  . sodium chloride flush  3 mL Intravenous Q12H  . sucroferric oxyhydroxide  500 mg Oral TID WC   Continuous Infusions: . sodium chloride    . [START ON 04/04/2019] remdesivir 100 mg in NS 100 mL     PRN Meds:.sodium chloride, acetaminophen, ondansetron **OR** ondansetron (ZOFRAN) IV, sodium chloride flush  CBC Recent Labs  Lab 03/28/19 1712 04/02/19 2031 04/03/19 0351  WBC 8.4 7.0 8.7  NEUTROABS  --  4.9 7.1  HGB 9.7* 9.1* 8.9*  HCT 32.9* 31.6* 30.5*  MCV 80.6 79.4* 79.6*  PLT 298 290 540   Basic Metabolic Panel Recent Labs  Lab 03/28/19 1712 04/02/19 2031 04/03/19 0351  NA 136 138 139  K 3.7 4.3 4.4  CL  97* 96* 95*  CO2 24  26 24   GLUCOSE 72 117* 208*  BUN 38* 31* 38*  CREATININE 5.71* 7.70* 8.45*  CALCIUM 8.8* 7.9* 8.2*    Physical Exam  Blood pressure (!) 110/56, pulse 79, temperature 100 F (37.8 C), temperature source Oral, resp. rate 17, SpO2 94 %. GEN: Dissheveled, groggy, but answers questions slowly ENT: NCAT EYES: EOMI CV: RRR nl s1s2 no rub PULM: diminished throughout ABD: s/nt/nd SKIN: no rashes/lesions EXT:no LEE LUE AVF +B/T   Assessment 65F ESRD [facility] via LUE AVF, recent PD which she says has malfunctioned, presenting with COVID19 PNA on dex/remdesivir  1. ESRD [facility], LUE AVF. Has PD cath in place 2. COVID 19 3. DM2 4. HTN, BP stab 5. Anemia, Hb 8.9 6. CKD-BMD  Plan 1. HD today, LUE AVF, 3h, 2K, 2-3L UF, No heparin 400/800 2. Can stay at [facility], not on PD 3. Will follow along 4. Will req outpt records   Rexene Agent  [phone] pgr 04/03/2019, 8:54 AM

## 2019-04-03 NOTE — ED Notes (Signed)
Patient's contact is (patient's daughter) [name] [phone]

## 2019-04-03 NOTE — ED Notes (Signed)
Patient is due for dialysis today.

## 2019-04-03 NOTE — Progress Notes (Signed)
PROGRESS NOTE [facility]   Kathryn Beck  ONG:[id number]  DOB: 1955/10/14  DOA: 04/02/2019 PCP: Fayrene Helper, MD   Brief Admission Hx: 64 y.o. female, with history of hypertension, hyperlipidemia, type 2 diabetes mellitus, depression, chronic diastolic CHF, ESRD on hemodialysis came to hospital with shortness of breath and tested positive for Covid 19 infection several days prior to admission.  She now has a new oxygen requirement.   MDM/Assessment & Plan:   1. Acute hypoxic respiratory failure -likely secondary to COVID-19 infection with pneumonia now presenting with a new oxygen requirement.  She has chest x-ray findings of patchy bilateral airspace disease.  She has been started on steroids and remdesivir.  Follow inflammatory markers and continue supportive care. 2. End-stage renal disease on hemodialysis-she was seen by nephrology and she is on hemodialysis therefore she can remain at this institution for her treatments.  Cancel transfer to [facility]. 3. Hypertension-patient had soft blood pressures on admission and her home blood pressure medications were temporarily held.  DVT prophylaxis: Heparin Code Status: Full Family Communication: Patient updated at bedside Disposition Plan: Admit for IV fluids, IV remdesivir and steroids and supportive care  Consultants:  Nephrology  Procedures:  Hemodialysis  Antimicrobials:     Subjective: Patient reports that the oxygen has helped tremendously with her shortness of breath.  She denies chest pain.  She is having a congestive sound of cough.  Objective: Vitals:   04/03/19 0930 04/03/19 1000 04/03/19 1030 04/03/19 1100  BP: 117/71 140/61 (!) 129/56 (!) 128/57  Pulse: 81 91 84 86  Resp: (!) 22 12 (!) 23 19  Temp:      TempSrc:      SpO2: 90% 92% 93%     Intake/Output Summary (Last 24 hours) at 04/03/2019 1446 Last data filed at 04/03/2019 0253 Gross per 24 hour  Intake 250 ml  Output -  Net 250 ml    There were no vitals filed for this visit.  REVIEW OF SYSTEMS  As per history otherwise all reviewed and reported negative  Exam:  General exam: Chronically ill-appearing female she is awake and alert lying in bed she is in no apparent distress.  Nasal cannula oxygen at 3 L. Respiratory system: Rales heard posteriorly on both sides.  no increased work of breathing. Cardiovascular system: S1 & S2 heard. No JVD, murmurs, gallops, clicks or pedal edema. Gastrointestinal system: Abdomen is nondistended, soft and nontender. Normal bowel sounds heard. Central nervous system: Alert and oriented. No focal neurological deficits.  Data Reviewed: Basic Metabolic Panel: Recent Labs  Lab 03/28/19 1712 04/02/19 2031 04/03/19 0351  NA 136 138 139  K 3.7 4.3 4.4  CL 97* 96* 95*  CO2 24 26 24   GLUCOSE 72 117* 208*  BUN 38* 31* 38*  CREATININE 5.71* 7.70* 8.45*  CALCIUM 8.8* 7.9* 8.2*   Liver Function Tests: Recent Labs  Lab 04/02/19 2031 04/03/19 0351  AST 63* 62*  ALT 36 37  ALKPHOS 99 108  BILITOT 0.5 0.6  PROT 7.9 7.9  ALBUMIN 3.2* 3.0*   No results for input(s): LIPASE, AMYLASE in the last 168 hours. No results for input(s): AMMONIA in the last 168 hours. CBC: Recent Labs  Lab 03/28/19 1712 04/02/19 2031 04/03/19 0351  WBC 8.4 7.0 8.7  NEUTROABS  --  4.9 7.1  HGB 9.7* 9.1* 8.9*  HCT 32.9* 31.6* 30.5*  MCV 80.6 79.4* 79.6*  PLT 298 290 307   Cardiac Enzymes: No results for input(s):  CKTOTAL, CKMB, CKMBINDEX, TROPONINI in the last 168 hours. CBG (last 3)  Recent Labs    04/02/19 2022 04/03/19 0820 04/03/19 1234  GLUCAP 113* 258* 421*   No results found for this or any previous visit (from the past 240 hour(s)).   Studies: DG Chest Portable 1 View  Result Date: 04/02/2019 CLINICAL DATA:  COVID positive.  Weakness, shortness of breath EXAM: PORTABLE CHEST 1 VIEW COMPARISON:  03/28/2019 FINDINGS: Patchy peripheral bilateral airspace opacities compatible with  pneumonia. Low lung volumes. No visible effusions or pneumothorax. Heart is upper limits normal in size. No acute bony abnormality. IMPRESSION: Patchy peripheral bilateral airspace disease compatible with COVID pneumonia. Electronically Signed   By: Rolm Baptise M.D.   On: 04/02/2019 20:47     Scheduled Meds: . allopurinol  100 mg Oral Daily  . Chlorhexidine Gluconate Cloth  6 each Topical Q0600  . dexamethasone (DECADRON) injection  6 mg Intravenous Q24H  . heparin  5,000 Units Subcutaneous Q8H  . insulin aspart  0-9 Units Subcutaneous TID WC  . insulin aspart  10 Units Subcutaneous TID WC  . insulin glargine  50 Units Subcutaneous Daily  . isosorbide mononitrate  30 mg Oral Daily  . pantoprazole  40 mg Oral Daily  . sertraline  50 mg Oral Daily  . sodium chloride flush  3 mL Intravenous Q12H  . sucroferric oxyhydroxide  500 mg Oral TID WC   Continuous Infusions: . sodium chloride    . [START ON 04/04/2019] remdesivir 100 mg in NS 100 mL      Active Problems:   Mixed hyperlipidemia   Morbid obesity (HCC)   Sleep apnea   GERD (gastroesophageal reflux disease)   ESRD on dialysis ([place])   Hypertension associated with diabetes ([place])   Not currently working due to disabled status   Neuropathy   Ischemic heart disease   Weakness   Pneumonia due to COVID-19 virus   Hypoxia  Time spent:   Irwin Brakeman, MD [facility] 04/03/2019, 2:46 PM    LOS: 1 day  How to contact the [facility] Attending or Consulting provider [place] or covering provider during after hours [place], for this patient?  1. Check the care team in [facility] and look for a) attending/consulting [facility] provider listed and b) the [facility] team listed 2. Log into [email or web address] and use [place]'s universal password to access. If you do not have the password, please contact the hospital operator. 3. Locate the [facility] provider you are looking for under [facility] and page to a number that you can be directly reached. 4. If  you still have difficulty reaching the provider, please page the [facility] (Director on Call) for the Hospitalists listed on amion for assistance.

## 2019-04-04 LAB — CBC WITH DIFFERENTIAL/PLATELET
Abs Immature Granulocytes: 0.08 10*3/uL — ABNORMAL HIGH (ref 0.00–0.07)
Basophils Absolute: 0 10*3/uL (ref 0.0–0.1)
Basophils Relative: 0 %
Eosinophils Absolute: 0 10*3/uL (ref 0.0–0.5)
Eosinophils Relative: 0 %
HCT: 31.2 % — ABNORMAL LOW (ref 36.0–46.0)
Hemoglobin: 9.3 g/dL — ABNORMAL LOW (ref 12.0–15.0)
Immature Granulocytes: 1 %
Lymphocytes Relative: 9 %
Lymphs Abs: 0.9 10*3/uL (ref 0.7–4.0)
MCH: 23 pg — ABNORMAL LOW (ref 26.0–34.0)
MCHC: 29.8 g/dL — ABNORMAL LOW (ref 30.0–36.0)
MCV: 77.2 fL — ABNORMAL LOW (ref 80.0–100.0)
Monocytes Absolute: 0.5 10*3/uL (ref 0.1–1.0)
Monocytes Relative: 4 %
Neutro Abs: 9.1 10*3/uL — ABNORMAL HIGH (ref 1.7–7.7)
Neutrophils Relative %: 86 %
Platelets: 364 10*3/uL (ref 150–400)
RBC: 4.04 MIL/uL (ref 3.87–5.11)
RDW: 17.9 % — ABNORMAL HIGH (ref 11.5–15.5)
WBC: 10.6 10*3/uL — ABNORMAL HIGH (ref 4.0–10.5)
nRBC: 0.7 % — ABNORMAL HIGH (ref 0.0–0.2)

## 2019-04-04 LAB — COMPREHENSIVE METABOLIC PANEL
ALT: 39 U/L (ref 0–44)
AST: 52 U/L — ABNORMAL HIGH (ref 15–41)
Albumin: 3.1 g/dL — ABNORMAL LOW (ref 3.5–5.0)
Alkaline Phosphatase: 108 U/L (ref 38–126)
Anion gap: 18 — ABNORMAL HIGH (ref 5–15)
BUN: 37 mg/dL — ABNORMAL HIGH (ref 8–23)
CO2: 25 mmol/L (ref 22–32)
Calcium: 8.5 mg/dL — ABNORMAL LOW (ref 8.9–10.3)
Chloride: 92 mmol/L — ABNORMAL LOW (ref 98–111)
Creatinine, Ser: 5.9 mg/dL — ABNORMAL HIGH (ref 0.44–1.00)
GFR calc Af Amer: 8 mL/min — ABNORMAL LOW (ref >60)
GFR calc non Af Amer: 7 mL/min — ABNORMAL LOW (ref >60)
Glucose, Bld: 240 mg/dL — ABNORMAL HIGH (ref 70–99)
Potassium: 4 mmol/L (ref 3.5–5.1)
Sodium: 135 mmol/L (ref 135–145)
Total Bilirubin: 0.4 mg/dL (ref 0.3–1.2)
Total Protein: 8.3 g/dL — ABNORMAL HIGH (ref 6.5–8.1)

## 2019-04-04 LAB — GLUCOSE, CAPILLARY
Glucose-Capillary: 212 mg/dL — ABNORMAL HIGH (ref 70–99)
Glucose-Capillary: 278 mg/dL — ABNORMAL HIGH (ref 70–99)
Glucose-Capillary: 282 mg/dL — ABNORMAL HIGH (ref 70–99)
Glucose-Capillary: 210 mg/dL — ABNORMAL HIGH (ref 70–99)
Glucose-Capillary: 197 mg/dL — ABNORMAL HIGH (ref 70–99)

## 2019-04-04 LAB — FERRITIN: Ferritin: 1982 ng/mL — ABNORMAL HIGH (ref 11–307)

## 2019-04-04 LAB — D-DIMER, QUANTITATIVE: D-Dimer, Quant: 2.04 ug/mL-FEU — ABNORMAL HIGH (ref 0.00–0.50)

## 2019-04-04 LAB — C-REACTIVE PROTEIN: CRP: 29.9 mg/dL — ABNORMAL HIGH (ref <1.0)

## 2019-04-04 MED ORDER — HYDROCOD POLST-CPM POLST ER 10-8 MG/5ML PO SUER
5.0000 mL | Freq: Two times a day (BID) | ORAL | Status: DC | PRN
  Administered 2019-04-04 – 2019-04-05 (×3): 5 mL via ORAL
  Filled 2019-04-04 (×3): qty 5

## 2019-04-04 MED ORDER — DARBEPOETIN ALFA 60 MCG/0.3ML IJ SOSY
60.0000 ug | PREFILLED_SYRINGE | INTRAMUSCULAR | Status: DC
  Filled 2019-04-04: qty 0.3

## 2019-04-04 MED ORDER — INSULIN GLARGINE 100 UNIT/ML ~~LOC~~ SOLN
56.0000 [IU] | Freq: Every day | SUBCUTANEOUS | Status: DC
  Administered 2019-04-05 – 2019-04-07 (×3): 56 [IU] via SUBCUTANEOUS
  Filled 2019-04-04 (×6): qty 0.56

## 2019-04-04 MED ORDER — CHLORHEXIDINE GLUCONATE CLOTH 2 % EX PADS: 6.0000 | MEDICATED_PAD | Freq: Every day | CUTANEOUS | Status: DC

## 2019-04-04 NOTE — Progress Notes (Signed)
[facility] Progress Note  Name: Kathryn Beck MRN: [id number] DOB: Apr 18, 1955   Subjective:  HD orders were obtained per nursing note as below.  HD on 1/11 with 945 mL UF.  She was noted to be below dry weight and unable to tolerate the 2  Kg UF ordered per assessing MD.  Feels a little better today.  Has been on 3 liters oxygen.  Review of systems:  States shortness of breath is better today No n/v Ate breakfast No cp   Intake/Output Summary (Last 24 hours) at 04/04/2019 0833 Last data filed at 04/03/2019 1750 Gross per 24 hour  Intake -  Output 945 ml  Net -945 ml    Vitals:  Vitals:   04/03/19 1800 04/03/19 2013 04/03/19 2040 04/04/19 0544  BP: 125/63  128/62 138/69  Pulse: 84  85 92  Resp: 18  18 20   Temp:   97.9 F (36.6 C) 98.2 F (36.8 C)  TempSrc:   Oral Oral  SpO2:  94% 94% 94%  Weight:         Physical Exam:  General adult female in bed in no acute distress HEENT normocephalic atraumatic extraocular movements intact sclera anicteric Neck supple trachea midline Lungs reduced breath sounds bilaterally on auscultation; no crackles; unlabored at rest on 3 liters oxygen   Heart S1S2 no rub Abdomen soft nontender nondistended Extremities trace edema; LUE AVF with bruit and thrill  Psych normal mood and affect Neuro - alert and oriented x 3    Medications reviewed   Labs:  BMP Latest Ref Rng & Units 04/04/2019 04/03/2019 04/02/2019  Glucose 70 - 99 mg/dL 240(H) 208(H) 117(H)  BUN 8 - 23 mg/dL 37(H) 38(H) 31(H)  Creatinine 0.44 - 1.00 mg/dL 5.90(H) 8.45(H) 7.70(H)  BUN/Creat Ratio 6 - 22 (calc) - - -  Sodium 135 - 145 mmol/L 135 139 138  Potassium 3.5 - 5.1 mmol/L 4.0 4.4 4.3  Chloride 98 - 111 mmol/L 92(L) 95(L) 96(L)  CO2 22 - 32 mmol/L 25 24 26   Calcium 8.9 - 10.3 mg/dL 8.5(L) 8.2(L) 7.9(L)   Outpatient HD orders from [place]: [facility] MWF 4h (has been attending [facility] Covid HD on TTS since last week) EDW  96kg 2K 2.5Ca LUE AVF 15g 400/600 Heparin infusion 1000u load + 1000u hourly Max UFR 1.5L/hr Epogen 8000u tiw  Assessment/Plan:   1. ESRD [facility], LUE AVF  1. -normally on MWF schedule but has been TTS for the past week due to covid shift at [facility].  S/p HD here on 1/11.  Plan for next HD on 1/13 - Wed   2. COVID 19 PNA 1. remdesivir and decadron per primary team   3. Acute hypoxic resp failure 1. On supplemental oxygen 2. 2/2 covid  3. Optimize volume status with HD 4. HTN - acceptable control  5. Anemia of CKD - on epogen. Anemia improving.  aranesp 60 mcg IV once on Wednesday - ordered to start 1/13 6. CKD-BMD - ordered phos for AM. On velphoro  7. DM2 - per primary team   Claudia Desanctis, MD 04/04/2019 8:33 AM

## 2019-04-04 NOTE — Progress Notes (Signed)
PROGRESS NOTE    Kathryn Beck  SWF:[id number] DOB: Jan 29, 1956 DOA: 04/02/2019 PCP: Fayrene Helper, MD     Brief Narrative:  64 y.o.female,with history of hypertension, hyperlipidemia, type 2 diabetes mellitus, depression, chronic diastolic CHF, ESRD on hemodialysis came to hospital with shortness of breath and tested positive for Covid 19 infection several days prior to admission.  She now has a new oxygen requirement.    Assessment & Plan: 1-acute hypoxemic respiratory failure secondary to COVID-19 infection -Continue weaning of oxygen supplementation as tolerated -Continue IV steroids and remdesivir (day #3) -Continue to follow inflammatory markers -Continue vitamin C and zinc -Patient encouraged to use incentive spirometer/flutter valve to exercise her lungs.  2-end-stage renal disease on hemodialysis -Appreciate nephrology assistance and recommendation -Continue hemodialysis treatment while inpatient; next treatment anticipated for 04/05/2019.  3-anemia of chronic kidney disease -Continue Epogen and IV iron as per nephrology discretion. -No signs of overt bleeding. -Hemoglobin 9.3.  4-type 2 diabetes mellitus with nephropathy -CBGs elevated in the setting of steroids usage -Poor control appreciated with A1c of 8.7 -Continue sliding scale insulin, NovoLog meal coverage and Lantus; last one adjusted to home dose (56 units) for better control of her CBGs.  5-gastroesophageal flux disease -Continue PPI  6-morbid obesity with sleep apnea -Body mass index is 39.48 kg/m. -Low calorie diet, portion control and lifestyle changes discussed with patient.  7-essential hypertension/CAD -Continue Imdur -no CP   DVT prophylaxis: Heparin Code Status: Full code Family Communication: No family at bedside. Disposition Plan: Remains inpatient, continue IV steroids and remdesivir; follow inflammatory markers.  Continue hemodialysis as per nephrology service  recommendations.  Consultants:   Nephrology service  Procedures:   See below for x-ray reports.  Antimicrobials:  Anti-infectives (From admission, onward)   Start     Dose/Rate Route Frequency Ordered Stop   04/04/19 1000  remdesivir 100 mg in sodium chloride 0.9 % 100 mL IVPB  Status:  Discontinued     100 mg 200 mL/hr over 30 Minutes Intravenous Daily 04/03/19 0058 04/03/19 0102   04/04/19 1000  remdesivir 100 mg in sodium chloride 0.9 % 100 mL IVPB     100 mg 200 mL/hr over 30 Minutes Intravenous Daily 04/02/19 2335 04/08/19 0959   04/03/19 0100  remdesivir 200 mg in sodium chloride 0.9% 250 mL IVPB  Status:  Discontinued     200 mg 580 mL/hr over 30 Minutes Intravenous Once 04/03/19 0058 04/03/19 0102   04/03/19 0030  remdesivir 200 mg in sodium chloride 0.9% 250 mL IVPB     200 mg 580 mL/hr over 30 Minutes Intravenous Once 04/02/19 2335 04/03/19 0253      Subjective: Still feeling short of breath and using 2 L nasal cannula supplementation.  No chest pain, no nausea, no vomiting.  Patient complaining of diarrhea.  Objective: Vitals:   04/03/19 2013 04/03/19 2040 04/04/19 0544 04/04/19 1300  BP:  128/62 138/69 136/71  Pulse:  85 92 87  Resp:  18 20 18   Temp:  97.9 F (36.6 C) 98.2 F (36.8 C) 97.9 F (36.6 C)  TempSrc:  Oral Oral Oral  SpO2: 94% 94% 94% 94%  Weight:        Intake/Output Summary (Last 24 hours) at 04/04/2019 1826 Last data filed at 04/04/2019 1700 Gross per 24 hour  Intake 706.78 ml  Output --  Net 706.78 ml   Filed Weights   04/03/19 1425  Weight: 91.7 kg    Examination: General exam: Alert, awake, oriented x  3; no chest pain, no nausea, no vomiting.  Still feeling short of breath on exertion and requiring 2 L oxygen supplementation. Respiratory system: Decreased breath sounds at the bases, positive rhonchi right, no wheezing, no using accessory muscles.   Cardiovascular system: RRR. No murmurs, rubs, gallops. Gastrointestinal system:  Abdomen is nondistended, soft and nontender. No organomegaly or masses felt. Normal bowel sounds heard. Central nervous system: Alert and oriented. No focal neurological deficits. Extremities: No cyanosis or clubbing. Skin: No rashes, lesions or ulcers Psychiatry: Judgement and insight appear normal. Mood & affect appropriate.     Data Reviewed: I have personally reviewed following labs and imaging studies  CBC: Recent Labs  Lab 04/02/19 2031 04/03/19 0351 04/04/19 0503  WBC 7.0 8.7 10.6*  NEUTROABS 4.9 7.1 9.1*  HGB 9.1* 8.9* 9.3*  HCT 31.6* 30.5* 31.2*  MCV 79.4* 79.6* 77.2*  PLT 290 307 160   Basic Metabolic Panel: Recent Labs  Lab 04/02/19 2031 04/03/19 0351 04/04/19 0503  NA 138 139 135  K 4.3 4.4 4.0  CL 96* 95* 92*  CO2 26 24 25   GLUCOSE 117* 208* 240*  BUN 31* 38* 37*  CREATININE 7.70* 8.45* 5.90*  CALCIUM 7.9* 8.2* 8.5*   GFR: Estimated Creatinine Clearance: 10 mL/min (A) (by C-G formula based on SCr of 5.9 mg/dL (H)).   Liver Function Tests: Recent Labs  Lab 04/02/19 2031 04/03/19 0351 04/04/19 0503  AST 63* 62* 52*  ALT 36 37 39  ALKPHOS 99 108 108  BILITOT 0.5 0.6 0.4  PROT 7.9 7.9 8.3*  ALBUMIN 3.2* 3.0* 3.1*   HbA1C: Recent Labs    04/02/19 2031  HGBA1C 8.7*   CBG: Recent Labs  Lab 04/03/19 2042 04/04/19 0202 04/04/19 0810 04/04/19 1105 04/04/19 1555  GLUCAP 151* 212* 278* 282* 210*   Anemia Panel: Recent Labs    04/03/19 0351 04/04/19 0503  FERRITIN 1,499* 1,982*   Urine analysis:    Component Value Date/Time   COLORURINE YELLOW 11/07/2017 2340   APPEARANCEUR HAZY (A) 11/07/2017 2340   LABSPEC 1.017 11/07/2017 2340   PHURINE 5.0 11/07/2017 2340   GLUCOSEU 150 (A) 11/07/2017 2340   HGBUR NEGATIVE 11/07/2017 2340   BILIRUBINUR NEGATIVE 11/07/2017 2340   BILIRUBINUR neg 02/15/2017 0913   KETONESUR NEGATIVE 11/07/2017 2340   PROTEINUR >=300 (A) 11/07/2017 2340   UROBILINOGEN 0.2 02/15/2017 0913   NITRITE NEGATIVE  11/07/2017 2340   LEUKOCYTESUR NEGATIVE 11/07/2017 2340   Radiology Studies: DG Chest Portable 1 View  Result Date: 04/02/2019 CLINICAL DATA:  COVID positive.  Weakness, shortness of breath EXAM: PORTABLE CHEST 1 VIEW COMPARISON:  03/28/2019 FINDINGS: Patchy peripheral bilateral airspace opacities compatible with pneumonia. Low lung volumes. No visible effusions or pneumothorax. Heart is upper limits normal in size. No acute bony abnormality. IMPRESSION: Patchy peripheral bilateral airspace disease compatible with COVID pneumonia. Electronically Signed   By: Rolm Baptise M.D.   On: 04/02/2019 20:47    Scheduled Meds: . allopurinol  100 mg Oral Daily  . Chlorhexidine Gluconate Cloth  6 each Topical Q0600  . Chlorhexidine Gluconate Cloth  6 each Topical Q0600  . [START ON 04/05/2019] darbepoetin (ARANESP) injection - DIALYSIS  60 mcg Intravenous Q Wed-HD  . dexamethasone (DECADRON) injection  6 mg Intravenous Q24H  . heparin  5,000 Units Subcutaneous Q8H  . insulin aspart  0-9 Units Subcutaneous TID WC  . insulin aspart  10 Units Subcutaneous TID WC  . [START ON 04/05/2019] insulin glargine  56 Units Subcutaneous Daily  .  isosorbide mononitrate  30 mg Oral Daily  . mouth rinse  15 mL Mouth Rinse BID  . pantoprazole  40 mg Oral Daily  . sertraline  50 mg Oral Daily  . sodium chloride flush  3 mL Intravenous Q12H  . sucroferric oxyhydroxide  500 mg Oral TID WC   Continuous Infusions: . sodium chloride    . sodium chloride    . sodium chloride    . remdesivir 100 mg in NS 100 mL 100 mg (04/04/19 0923)     LOS: 2 days    Time spent: 35 minutes.    Barton Dubois, MD [facility] Pager [phone]  04/04/2019, 6:26 PM

## 2019-04-04 NOTE — Progress Notes (Signed)
Inpatient Diabetes Program Recommendations  AACE/ADA: New Consensus Statement on Inpatient Glycemic Control (2015)  Target Ranges:  Prepandial:   less than 140 mg/dL      Peak postprandial:   less than 180 mg/dL (1-2 hours)      Critically ill patients:  140 - 180 mg/dL   Lab Results  Component Value Date   GLUCAP 278 (H) 04/04/2019   HGBA1C 8.7 (H) 04/02/2019    Review of Glycemic Control Results for Kathryn Beck, Kathryn Beck (MRN [id number]) as of 04/04/2019 09:36  Ref. Range 04/03/2019 08:20 04/03/2019 12:34 04/03/2019 16:22 04/03/2019 20:42 04/04/2019 02:02 04/04/2019 08:10  Glucose-Capillary Latest Ref Range: 70 - 99 mg/dL 258 (H) 421 (H) 180 (H) 151 (H) 212 (H) 278 (H)   Diabetes history: DM 2 Outpatient Diabetes medications: Tresiba 50-56 units QAm, Humalog 24-28 units TID  Current orders for Inpatient glycemic control:  Lantus 50 units Daily Novolog 10 units tid meal coverage Novolog 0-9 units tid  Decadron 6mg  Q24 hours A1c 8.7% on 04/02/19 Hgb 9.1 BUN/Creat: 37/5.90 (HD today)   Inpatient Diabetes Program Recommendations:    Consider increasing Lantus to max home dose of 56 units.  Thanks,  Tama Headings RN, MSN, BC-ADM Inpatient Diabetes Coordinator Team Pager [phone] (8a-5p)

## 2019-04-05 LAB — COMPREHENSIVE METABOLIC PANEL
ALT: 31 U/L (ref 0–44)
AST: 30 U/L (ref 15–41)
Albumin: 3.1 g/dL — ABNORMAL LOW (ref 3.5–5.0)
Alkaline Phosphatase: 110 U/L (ref 38–126)
Anion gap: >20 — ABNORMAL HIGH (ref 5–15)
BUN: 68 mg/dL — ABNORMAL HIGH (ref 8–23)
CO2: 24 mmol/L (ref 22–32)
Calcium: 8.9 mg/dL (ref 8.9–10.3)
Chloride: 92 mmol/L — ABNORMAL LOW (ref 98–111)
Creatinine, Ser: 7.94 mg/dL — ABNORMAL HIGH (ref 0.44–1.00)
GFR calc Af Amer: 6 mL/min — ABNORMAL LOW (ref >60)
GFR calc non Af Amer: 5 mL/min — ABNORMAL LOW (ref >60)
Glucose, Bld: 226 mg/dL — ABNORMAL HIGH (ref 70–99)
Potassium: 4.3 mmol/L (ref 3.5–5.1)
Sodium: 138 mmol/L (ref 135–145)
Total Bilirubin: 0.5 mg/dL (ref 0.3–1.2)
Total Protein: 8 g/dL (ref 6.5–8.1)

## 2019-04-05 LAB — CBC WITH DIFFERENTIAL/PLATELET
Abs Immature Granulocytes: 0.09 10*3/uL — ABNORMAL HIGH (ref 0.00–0.07)
Basophils Absolute: 0 10*3/uL (ref 0.0–0.1)
Basophils Relative: 0 %
Eosinophils Absolute: 0 10*3/uL (ref 0.0–0.5)
Eosinophils Relative: 0 %
HCT: 34.1 % — ABNORMAL LOW (ref 36.0–46.0)
Hemoglobin: 10.2 g/dL — ABNORMAL LOW (ref 12.0–15.0)
Immature Granulocytes: 1 %
Lymphocytes Relative: 8 %
Lymphs Abs: 0.8 10*3/uL (ref 0.7–4.0)
MCH: 22.9 pg — ABNORMAL LOW (ref 26.0–34.0)
MCHC: 29.9 g/dL — ABNORMAL LOW (ref 30.0–36.0)
MCV: 76.5 fL — ABNORMAL LOW (ref 80.0–100.0)
Monocytes Absolute: 0.4 10*3/uL (ref 0.1–1.0)
Monocytes Relative: 4 %
Neutro Abs: 7.9 10*3/uL — ABNORMAL HIGH (ref 1.7–7.7)
Neutrophils Relative %: 87 %
Platelets: 535 10*3/uL — ABNORMAL HIGH (ref 150–400)
RBC: 4.46 MIL/uL (ref 3.87–5.11)
RDW: 17.7 % — ABNORMAL HIGH (ref 11.5–15.5)
WBC: 9.2 10*3/uL (ref 4.0–10.5)
nRBC: 1.1 % — ABNORMAL HIGH (ref 0.0–0.2)

## 2019-04-05 LAB — GLUCOSE, CAPILLARY
Glucose-Capillary: 175 mg/dL — ABNORMAL HIGH (ref 70–99)
Glucose-Capillary: 230 mg/dL — ABNORMAL HIGH (ref 70–99)
Glucose-Capillary: 269 mg/dL — ABNORMAL HIGH (ref 70–99)
Glucose-Capillary: 128 mg/dL — ABNORMAL HIGH (ref 70–99)
Glucose-Capillary: 131 mg/dL — ABNORMAL HIGH (ref 70–99)

## 2019-04-05 LAB — C-REACTIVE PROTEIN: CRP: 19.2 mg/dL — ABNORMAL HIGH (ref <1.0)

## 2019-04-05 LAB — FERRITIN: Ferritin: 2492 ng/mL — ABNORMAL HIGH (ref 11–307)

## 2019-04-05 LAB — PHOSPHORUS: Phosphorus: 6.9 mg/dL — ABNORMAL HIGH (ref 2.5–4.6)

## 2019-04-05 LAB — D-DIMER, QUANTITATIVE: D-Dimer, Quant: 1.79 ug/mL-FEU — ABNORMAL HIGH (ref 0.00–0.50)

## 2019-04-05 MED ORDER — GUAIFENESIN ER 600 MG PO TB12
600.0000 mg | ORAL_TABLET | Freq: Two times a day (BID) | ORAL | Status: DC
  Administered 2019-04-05 – 2019-04-07 (×5): 600 mg via ORAL
  Filled 2019-04-05 (×5): qty 1

## 2019-04-05 MED ORDER — GUAIFENESIN-DM 100-10 MG/5ML PO SYRP
10.0000 mL | ORAL_SOLUTION | ORAL | Status: DC | PRN
  Administered 2019-04-05: 15:00:00 10 mL via ORAL
  Filled 2019-04-05: qty 10

## 2019-04-05 NOTE — Progress Notes (Signed)
PROGRESS NOTE    Kathryn Beck  URK:[id number] DOB: Aug 14, 1955 DOA: 04/02/2019 PCP: Fayrene Helper, MD     Brief Narrative:  64 y.o.female,with history of hypertension, hyperlipidemia, type 2 diabetes mellitus, depression, chronic diastolic CHF, ESRD on hemodialysis came to hospital with shortness of breath and tested positive for Covid 19 infection several days prior to admission.  She now has a new oxygen requirement.    Assessment & Plan: 1-acute hypoxemic respiratory failure secondary to COVID-19 infection -Continue weaning of oxygen supplementation as tolerated -Continue IV steroids and remdesivir (day #3 of 5) -Continue to follow inflammatory markers -Continue vitamin C and zinc -Patient encouraged to use incentive spirometer/flutter valve to exercise her lungs.  2-end-stage renal disease on hemodialysis -Appreciate nephrology assistance and recommendation -Continue hemodialysis treatment while inpatient;  -HD on 04/05/2019  3-anemia of chronic kidney disease -Continue Epogen and IV iron as per nephrology discretion. -No signs of overt bleeding. -Hemoglobin stable  4-type 2 diabetes mellitus with nephropathy -CBGs elevated in the setting of steroids usage -Poor control appreciated with A1c of 8.7 -Continue sliding scale insulin, NovoLog meal coverage and Lantus; last one adjusted to home dose (56 units) for better control of her CBGs.  5-gastroesophageal flux disease -Continue PPI  6-morbid obesity with sleep apnea -Body mass index is 38.23 kg/m. -Low calorie diet, portion control and lifestyle changes discussed with patient.  7-essential hypertension/CAD -Continue Imdur -no CP   DVT prophylaxis: Heparin Code Status: Full code Family Communication: No family at bedside. Disposition Plan: Remains inpatient, continue IV steroids and remdesivir; follow inflammatory markers.  Continue hemodialysis as per nephrology service recommendations.  Consultants:    Nephrology service  Procedures:   See below for x-ray reports.  Antimicrobials:  Anti-infectives (From admission, onward)   Start     Dose/Rate Route Frequency Ordered Stop   04/04/19 1000  remdesivir 100 mg in sodium chloride 0.9 % 100 mL IVPB  Status:  Discontinued     100 mg 200 mL/hr over 30 Minutes Intravenous Daily 04/03/19 0058 04/03/19 0102   04/04/19 1000  remdesivir 100 mg in sodium chloride 0.9 % 100 mL IVPB     100 mg 200 mL/hr over 30 Minutes Intravenous Daily 04/02/19 2335 04/08/19 0959   04/03/19 0100  remdesivir 200 mg in sodium chloride 0.9% 250 mL IVPB  Status:  Discontinued     200 mg 580 mL/hr over 30 Minutes Intravenous Once 04/03/19 0058 04/03/19 0102   04/03/19 0030  remdesivir 200 mg in sodium chloride 0.9% 250 mL IVPB     200 mg 580 mL/hr over 30 Minutes Intravenous Once 04/02/19 2335 04/03/19 0253      Subjective:   Hypoxia and dyspnea persist, sounds congested, -For HD on 04/05/2019, Dr. Royce Macadamia for nephrology service at bedside.  Objective: Vitals:   04/05/19 1745 04/05/19 1800 04/05/19 1815 04/05/19 1830  BP: (!) 117/58 (!) 118/54 (!) 108/55 (P) 126/61  Pulse: 75 75 77 (P) 80  Resp:      Temp:      TempSrc:      SpO2:      Weight:        Intake/Output Summary (Last 24 hours) at 04/05/2019 1921 Last data filed at 04/05/2019 1600 Gross per 24 hour  Intake 460.49 ml  Output 1 ml  Net 459.49 ml   Filed Weights   04/03/19 1425 04/05/19 1452  Weight: 91.7 kg 88.8 kg    Examination: General exam: Alert, awake, oriented x 3; no chest pain,  no nausea, no vomiting.  Still feeling short of breath on exertion and requiring 2 L oxygen supplementation. Respiratory system: Decreased breath sounds at the bases, positive rhonchi right, no wheezing, no using accessory muscles.   Cardiovascular system: RRR. No murmurs, rubs, gallops. Gastrointestinal system: Abdomen is nondistended, soft and nontender. No organomegaly or masses felt. Normal bowel  sounds heard. Central nervous system: Alert and oriented. No focal neurological deficits. Extremities: No cyanosis or clubbing. Skin: No rashes, lesions or ulcers Psychiatry: Judgement and insight appear normal. Mood & affect appropriate.     Data Reviewed: I have personally reviewed following labs and imaging studies  CBC: Recent Labs  Lab 04/02/19 2031 04/03/19 0351 04/04/19 0503 04/05/19 0647  WBC 7.0 8.7 10.6* 9.2  NEUTROABS 4.9 7.1 9.1* 7.9*  HGB 9.1* 8.9* 9.3* 10.2*  HCT 31.6* 30.5* 31.2* 34.1*  MCV 79.4* 79.6* 77.2* 76.5*  PLT 290 307 364 101*   Basic Metabolic Panel: Recent Labs  Lab 04/02/19 2031 04/03/19 0351 04/04/19 0503 04/05/19 0647  NA 138 139 135 138  K 4.3 4.4 4.0 4.3  CL 96* 95* 92* 92*  CO2 26 24 25 24   GLUCOSE 117* 208* 240* 226*  BUN 31* 38* 37* 68*  CREATININE 7.70* 8.45* 5.90* 7.94*  CALCIUM 7.9* 8.2* 8.5* 8.9  PHOS  --   --   --  6.9*   GFR: Estimated Creatinine Clearance: 7.3 mL/min (A) (by C-G formula based on SCr of 7.94 mg/dL (H)).   Liver Function Tests: Recent Labs  Lab 04/02/19 2031 04/03/19 0351 04/04/19 0503 04/05/19 0647  AST 63* 62* 52* 30  ALT 36 37 39 31  ALKPHOS 99 108 108 110  BILITOT 0.5 0.6 0.4 0.5  PROT 7.9 7.9 8.3* 8.0  ALBUMIN 3.2* 3.0* 3.1* 3.1*   HbA1C: Recent Labs    04/02/19 2031  HGBA1C 8.7*   CBG: Recent Labs  Lab 04/04/19 2059 04/05/19 0145 04/05/19 0747 04/05/19 1130 04/05/19 1705  GLUCAP 197* 175* 230* 269* 128*   Anemia Panel: Recent Labs    04/04/19 0503 04/05/19 0647  FERRITIN 1,982* 2,492*   Urine analysis:    Component Value Date/Time   COLORURINE YELLOW 11/07/2017 2340   APPEARANCEUR HAZY (A) 11/07/2017 2340   LABSPEC 1.017 11/07/2017 2340   PHURINE 5.0 11/07/2017 2340   GLUCOSEU 150 (A) 11/07/2017 2340   HGBUR NEGATIVE 11/07/2017 2340   BILIRUBINUR NEGATIVE 11/07/2017 2340   BILIRUBINUR neg 02/15/2017 0913   KETONESUR NEGATIVE 11/07/2017 2340   PROTEINUR >=300 (A)  11/07/2017 2340   UROBILINOGEN 0.2 02/15/2017 0913   NITRITE NEGATIVE 11/07/2017 2340   LEUKOCYTESUR NEGATIVE 11/07/2017 2340   Radiology Studies: No results found.  Scheduled Meds: . allopurinol  100 mg Oral Daily  . Chlorhexidine Gluconate Cloth  6 each Topical Q0600  . darbepoetin (ARANESP) injection - DIALYSIS  60 mcg Intravenous Q Wed-HD  . dexamethasone (DECADRON) injection  6 mg Intravenous Q24H  . guaiFENesin  600 mg Oral BID  . heparin  5,000 Units Subcutaneous Q8H  . insulin aspart  0-9 Units Subcutaneous TID WC  . insulin aspart  10 Units Subcutaneous TID WC  . insulin glargine  56 Units Subcutaneous Daily  . isosorbide mononitrate  30 mg Oral Daily  . mouth rinse  15 mL Mouth Rinse BID  . pantoprazole  40 mg Oral Daily  . sertraline  50 mg Oral Daily  . sodium chloride flush  3 mL Intravenous Q12H  . sucroferric oxyhydroxide  500 mg  Oral TID WC   Continuous Infusions: . sodium chloride    . sodium chloride    . sodium chloride    . remdesivir 100 mg in NS 100 mL 100 mg (04/05/19 0849)     LOS: 3 days    Roxan Hockey, MD [facility]  04/05/2019, 7:21 PM

## 2019-04-05 NOTE — Procedures (Signed)
    HEMODIALYSIS TREATMENT NOTE:  3.5 hour heparin-free treatment completed via left upper arm AVF (15g/antegrade). Goal NOT met: Unable to tolerate removal of 2L.  UF was interrupted for a total of 45 minutes due to hypotension (as low as 78/43).  Able to tolerate rate of 540cc/h.  Net UF 1.6 liters.  All blood was returned.  Rockwell Alexandria, RN

## 2019-04-05 NOTE — Progress Notes (Signed)
Continued to C/O cough today unrelieved by tussionex.  Received order for robitussin and mucinex.  Saturations in low 90's on room air.

## 2019-04-05 NOTE — Progress Notes (Signed)
[facility] Progress Note  Name: Kathryn Beck MRN: [id number] DOB: 10/11/1955   Subjective:  She last had HD on 1/11 with 945 mL UF.  She was noted to be below dry weight per charting and didn't achieve her 2 liter goal at that time.  States that she is doing okay today except for the coughing.  She does feel little swollen but not bad  Review of systems:   Shortness of breath has improved Denies nausea or vomiting Eating breakfast No chest pain   Intake/Output Summary (Last 24 hours) at 04/05/2019 0853 Last data filed at 04/04/2019 1700 Gross per 24 hour  Intake 706.78 ml  Output --  Net 706.78 ml    Vitals:  Vitals:   04/04/19 1300 04/04/19 2053 04/04/19 2102 04/05/19 0610  BP: 136/71  (!) 155/71 (!) 160/72  Pulse: 87  84 83  Resp: 18  20 18   Temp: 97.9 F (36.6 C)  98.2 F (36.8 C) 98.1 F (36.7 C)  TempSrc: Oral  Oral Oral  SpO2: 94% 94% 94% 99%  Weight:         Physical Exam:  General adult female in bed in no acute distress HEENT NCAT extraocular movements intact sclera anicteric Neck supple trachea midline Lungs clearer but diminished breath sounds bilaterally no crackles appreciated on 1 L of oxygen Heart S1S2 no rub Abdomen soft nontender nondistended Extremities she has trace edema ; LUE AVF with bruit and thrill  Psych normal mood and affect Neuro - alert and oriented x 3   Medications reviewed    Labs:  BMP Latest Ref Rng & Units 04/05/2019 04/04/2019 04/03/2019  Glucose 70 - 99 mg/dL 226(H) 240(H) 208(H)  BUN 8 - 23 mg/dL 68(H) 37(H) 38(H)  Creatinine 0.44 - 1.00 mg/dL 7.94(H) 5.90(H) 8.45(H)  BUN/Creat Ratio 6 - 22 (calc) - - -  Sodium 135 - 145 mmol/L 138 135 139  Potassium 3.5 - 5.1 mmol/L 4.3 4.0 4.4  Chloride 98 - 111 mmol/L 92(L) 92(L) 95(L)  CO2 22 - 32 mmol/L 24 25 24   Calcium 8.9 - 10.3 mg/dL 8.9 8.5(L) 8.2(L)   Outpatient HD orders from [facility]:per nursing note [address] MWF 4h (has been  attending [name] Covid HD on TTS since last week) EDW 96kg 2K 2.5Ca LUE AVF 15g 400/600 Heparin infusion 1000u load + 1000u hourly Max UFR 1.5L/hr Epogen 8000u tiw  Assessment/Plan:   1. ESRD [facility], LUE AVF  1. -normally on MWF schedule but has been TTS for the past week due to covid shift at [facility].  S/p HD here on 1/11. For now, keep MWF schedule for today HD 1/13  2. COVID 19 PNA 1. remdesivir and decadron per primary team   3. Acute hypoxic resp failure 1. On supplemental oxygen 2. 2/2 covid  3. Optimize volume status with HD - challenging EDW 4. HTN - UF with HD as able 5. Anemia of CKD - on epogen.  aranesp 60 mcg IV once on Wednesday - ordered to start 1/13 6. CKD-BMD - hyperphos - follow on velphoro   7. DM2 - per primary team   Claudia Desanctis, MD 04/05/2019 9:26 AM

## 2019-04-06 DIAGNOSIS — U071 COVID-19: Secondary | ICD-10-CM

## 2019-04-06 HISTORY — DX: COVID-19: U07.1

## 2019-04-06 LAB — COMPREHENSIVE METABOLIC PANEL
ALT: 29 U/L (ref 0–44)
AST: 35 U/L (ref 15–41)
Albumin: 3.3 g/dL — ABNORMAL LOW (ref 3.5–5.0)
Alkaline Phosphatase: 110 U/L (ref 38–126)
Anion gap: 18 — ABNORMAL HIGH (ref 5–15)
BUN: 45 mg/dL — ABNORMAL HIGH (ref 8–23)
CO2: 26 mmol/L (ref 22–32)
Calcium: 9.2 mg/dL (ref 8.9–10.3)
Chloride: 92 mmol/L — ABNORMAL LOW (ref 98–111)
Creatinine, Ser: 5.66 mg/dL — ABNORMAL HIGH (ref 0.44–1.00)
GFR calc Af Amer: 9 mL/min — ABNORMAL LOW (ref >60)
GFR calc non Af Amer: 7 mL/min — ABNORMAL LOW (ref >60)
Glucose, Bld: 142 mg/dL — ABNORMAL HIGH (ref 70–99)
Potassium: 3.9 mmol/L (ref 3.5–5.1)
Sodium: 136 mmol/L (ref 135–145)
Total Bilirubin: 0.8 mg/dL (ref 0.3–1.2)
Total Protein: 8.5 g/dL — ABNORMAL HIGH (ref 6.5–8.1)

## 2019-04-06 LAB — GLUCOSE, CAPILLARY
Glucose-Capillary: 110 mg/dL — ABNORMAL HIGH (ref 70–99)
Glucose-Capillary: 179 mg/dL — ABNORMAL HIGH (ref 70–99)
Glucose-Capillary: 199 mg/dL — ABNORMAL HIGH (ref 70–99)
Glucose-Capillary: 189 mg/dL — ABNORMAL HIGH (ref 70–99)
Glucose-Capillary: 101 mg/dL — ABNORMAL HIGH (ref 70–99)

## 2019-04-06 LAB — CBC WITH DIFFERENTIAL/PLATELET
Abs Immature Granulocytes: 0.1 10*3/uL — ABNORMAL HIGH (ref 0.00–0.07)
Basophils Absolute: 0 10*3/uL (ref 0.0–0.1)
Basophils Relative: 0 %
Eosinophils Absolute: 0 10*3/uL (ref 0.0–0.5)
Eosinophils Relative: 0 %
HCT: 35.3 % — ABNORMAL LOW (ref 36.0–46.0)
Hemoglobin: 10.6 g/dL — ABNORMAL LOW (ref 12.0–15.0)
Immature Granulocytes: 1 %
Lymphocytes Relative: 11 %
Lymphs Abs: 0.8 10*3/uL (ref 0.7–4.0)
MCH: 23 pg — ABNORMAL LOW (ref 26.0–34.0)
MCHC: 30 g/dL (ref 30.0–36.0)
MCV: 76.7 fL — ABNORMAL LOW (ref 80.0–100.0)
Monocytes Absolute: 0.6 10*3/uL (ref 0.1–1.0)
Monocytes Relative: 9 %
Neutro Abs: 5.5 10*3/uL (ref 1.7–7.7)
Neutrophils Relative %: 79 %
Platelets: 541 10*3/uL — ABNORMAL HIGH (ref 150–400)
RBC: 4.6 MIL/uL (ref 3.87–5.11)
RDW: 17.6 % — ABNORMAL HIGH (ref 11.5–15.5)
WBC: 7 10*3/uL (ref 4.0–10.5)
nRBC: 1.4 % — ABNORMAL HIGH (ref 0.0–0.2)

## 2019-04-06 LAB — FERRITIN: Ferritin: 2937 ng/mL — ABNORMAL HIGH (ref 11–307)

## 2019-04-06 LAB — D-DIMER, QUANTITATIVE: D-Dimer, Quant: 1.62 ug/mL-FEU — ABNORMAL HIGH (ref 0.00–0.50)

## 2019-04-06 LAB — C-REACTIVE PROTEIN: CRP: 11 mg/dL — ABNORMAL HIGH (ref <1.0)

## 2019-04-06 NOTE — Progress Notes (Signed)
PROGRESS NOTE    Kathryn Beck  SEG:[id number] DOB: 1955/07/26 DOA: 04/02/2019 PCP: Fayrene Helper, MD     Brief Narrative:  64 y.o.female,with history of hypertension, hyperlipidemia, type 2 diabetes mellitus, depression, chronic diastolic CHF, ESRD on hemodialysis came to hospital with shortness of breath and tested positive for Covid 19 infection several days prior to admission.    Was admitted with hypoxia.    Assessment & Plan: 1-acute hypoxemic respiratory failure secondary to COVID-19 infection No longer requiring oxygen at rest, dyspnea on exertion persist -Continue IV steroids and remdesivir (day #4 of 5) --Anticipate discharge home on 04/07/2019 after completing 5 days of IV remdesivir -Continue to follow inflammatory markers -Continue vitamin C and zinc -Patient encouraged to use incentive spirometer/flutter valve to exercise her lungs.  2-end-stage renal disease on hemodialysis -Appreciate nephrology assistance and recommendation -Continue hemodialysis treatment while inpatient;  -HD on 04/05/2019  3-anemia of chronic kidney disease -Continue Epogen and IV iron as per nephrology discretion. -No signs of overt bleeding. -Hemoglobin stable  4-type 2 diabetes mellitus with nephropathy -CBGs elevated in the setting of steroids usage -Poor control appreciated with A1c of 8.7 -Continue sliding scale insulin, NovoLog meal coverage and Lantus; last one adjusted to home dose (56 units) for better control of her CBGs.  5-gastroesophageal flux disease -Continue PPI  6-morbid obesity with sleep apnea -Body mass index is 38.23 kg/m. -Low calorie diet, portion control and lifestyle changes discussed with patient.  7-essential hypertension/CAD -Continue Imdur -no CP   DVT prophylaxis: Heparin Code Status: Full code Family Communication: No family at bedside. Disposition Plan: Remains inpatient, continue IV steroids and remdesivir; follow inflammatory markers.   Continue hemodialysis as per nephrology service recommendations. -Anticipate discharge home on 04/07/2019 after completing 5 days of IV remdesivir  Consultants:   Nephrology service  Procedures:   See below for x-ray reports.  Antimicrobials:  Anti-infectives (From admission, onward)   Start     Dose/Rate Route Frequency Ordered Stop   04/04/19 1000  remdesivir 100 mg in sodium chloride 0.9 % 100 mL IVPB  Status:  Discontinued     100 mg 200 mL/hr over 30 Minutes Intravenous Daily 04/03/19 0058 04/03/19 0102   04/04/19 1000  remdesivir 100 mg in sodium chloride 0.9 % 100 mL IVPB     100 mg 200 mL/hr over 30 Minutes Intravenous Daily 04/02/19 2335 04/08/19 0959   04/03/19 0100  remdesivir 200 mg in sodium chloride 0.9% 250 mL IVPB  Status:  Discontinued     200 mg 580 mL/hr over 30 Minutes Intravenous Once 04/03/19 0058 04/03/19 0102   04/03/19 0030  remdesivir 200 mg in sodium chloride 0.9% 250 mL IVPB     200 mg 580 mL/hr over 30 Minutes Intravenous Once 04/02/19 2335 04/03/19 0253      Subjective:  -No longer requiring oxygen at rest, dyspnea on exertion and mild hypoxia on exertion persist -Did not tolerate HD well on 04/04/2018 due to soft BP  Objective: Vitals:   04/05/19 2102 04/05/19 2106 04/06/19 0527 04/06/19 1333  BP: (!) 150/65  (!) 133/56 (!) 157/70  Pulse: 78 80 80 92  Resp: 20  20 18   Temp: 98.5 F (36.9 C)  98.5 F (36.9 C) 98.3 F (36.8 C)  TempSrc: Oral  Oral Oral  SpO2: (!) 88% 94% 95% 92%  Weight:        Intake/Output Summary (Last 24 hours) at 04/06/2019 1846 Last data filed at 04/06/2019 1100 Gross per 24 hour  Intake 180 ml  Output --  Net 180 ml   Filed Weights   04/03/19 1425 04/05/19 1452  Weight: 91.7 kg 88.8 kg    Examination: General exam: Alert, awake, oriented x 3; no chest pain,  Respiratory system: Decreased breath sounds at the bases, positive rhonchi right, no wheezing, no using accessory muscles.  Cardiovascular system:  RRR. No murmurs, rubs, gallops. Gastrointestinal system: Abdomen is nondistended, soft and nontender.   Normal bowel sounds heard. Central nervous system: Alert and oriented. No focal neurological deficits. Extremities: No cyanosis or clubbing. Skin: No rashes, lesions or ulcers Psychiatry: Judgement and insight appear normal. Mood & affect appropriate.   Data Reviewed: I have personally reviewed following labs and imaging studies  CBC: Recent Labs  Lab 04/02/19 2031 04/03/19 0351 04/04/19 0503 04/05/19 0647 04/06/19 0606  WBC 7.0 8.7 10.6* 9.2 7.0  NEUTROABS 4.9 7.1 9.1* 7.9* 5.5  HGB 9.1* 8.9* 9.3* 10.2* 10.6*  HCT 31.6* 30.5* 31.2* 34.1* 35.3*  MCV 79.4* 79.6* 77.2* 76.5* 76.7*  PLT 290 307 364 535* 962*   Basic Metabolic Panel: Recent Labs  Lab 04/02/19 2031 04/03/19 0351 04/04/19 0503 04/05/19 0647 04/06/19 0606  NA 138 139 135 138 136  K 4.3 4.4 4.0 4.3 3.9  CL 96* 95* 92* 92* 92*  CO2 26 24 25 24 26   GLUCOSE 117* 208* 240* 226* 142*  BUN 31* 38* 37* 68* 45*  CREATININE 7.70* 8.45* 5.90* 7.94* 5.66*  CALCIUM 7.9* 8.2* 8.5* 8.9 9.2  PHOS  --   --   --  6.9*  --    GFR: Estimated Creatinine Clearance: 10.2 mL/min (A) (by C-G formula based on SCr of 5.66 mg/dL (H)).   Liver Function Tests: Recent Labs  Lab 04/02/19 2031 04/03/19 0351 04/04/19 0503 04/05/19 0647 04/06/19 0606  AST 63* 62* 52* 30 35  ALT 36 37 39 31 29  ALKPHOS 99 108 108 110 110  BILITOT 0.5 0.6 0.4 0.5 0.8  PROT 7.9 7.9 8.3* 8.0 8.5*  ALBUMIN 3.2* 3.0* 3.1* 3.1* 3.3*   HbA1C: No results for input(s): HGBA1C in the last 72 hours. CBG: Recent Labs  Lab 04/05/19 2056 04/06/19 0255 04/06/19 0725 04/06/19 1131 04/06/19 1701  GLUCAP 131* 110* 179* 199* 189*   Anemia Panel: Recent Labs    04/05/19 0647 04/06/19 0606  FERRITIN 2,492* 2,937*   Urine analysis:    Component Value Date/Time   COLORURINE YELLOW 11/07/2017 2340   APPEARANCEUR HAZY (A) 11/07/2017 2340   LABSPEC  1.017 11/07/2017 2340   PHURINE 5.0 11/07/2017 2340   GLUCOSEU 150 (A) 11/07/2017 2340   HGBUR NEGATIVE 11/07/2017 2340   BILIRUBINUR NEGATIVE 11/07/2017 2340   BILIRUBINUR neg 02/15/2017 0913   KETONESUR NEGATIVE 11/07/2017 2340   PROTEINUR >=300 (A) 11/07/2017 2340   UROBILINOGEN 0.2 02/15/2017 0913   NITRITE NEGATIVE 11/07/2017 2340   LEUKOCYTESUR NEGATIVE 11/07/2017 2340   Radiology Studies: No results found.  Scheduled Meds: . allopurinol  100 mg Oral Daily  . Chlorhexidine Gluconate Cloth  6 each Topical Q0600  . darbepoetin (ARANESP) injection - DIALYSIS  60 mcg Intravenous Q Wed-HD  . dexamethasone (DECADRON) injection  6 mg Intravenous Q24H  . guaiFENesin  600 mg Oral BID  . heparin  5,000 Units Subcutaneous Q8H  . insulin aspart  0-9 Units Subcutaneous TID WC  . insulin aspart  10 Units Subcutaneous TID WC  . insulin glargine  56 Units Subcutaneous Daily  . isosorbide mononitrate  30 mg Oral  Daily  . mouth rinse  15 mL Mouth Rinse BID  . pantoprazole  40 mg Oral Daily  . sertraline  50 mg Oral Daily  . sodium chloride flush  3 mL Intravenous Q12H  . sucroferric oxyhydroxide  500 mg Oral TID WC   Continuous Infusions: . sodium chloride    . sodium chloride    . sodium chloride    . remdesivir 100 mg in NS 100 mL Stopped (04/06/19 1009)     LOS: 4 days    Roxan Hockey, MD [facility]  04/06/2019, 6:46 PM

## 2019-04-06 NOTE — Progress Notes (Signed)
[facility] Progress Note  Name: Kathryn Beck MRN: [id number] DOB: 1956-02-07   Subjective:  HD yesterday 1.6L UF On RA, no c/o this AM    Intake/Output Summary (Last 24 hours) at 04/06/2019 0949 Last data filed at 04/05/2019 1830 Gross per 24 hour  Intake 340.49 ml  Output 1602 ml  Net -1261.51 ml    Vitals:  Vitals:   04/05/19 1845 04/05/19 2102 04/05/19 2106 04/06/19 0527  BP: 130/63 (!) 150/65  (!) 133/56  Pulse: 82 78 80 80  Resp:  20  20  Temp:  98.5 F (36.9 C)  98.5 F (36.9 C)  TempSrc:  Oral  Oral  SpO2:  (!) 88% 94% 95%  Weight:         Physical Exam:  General adult female in bed in no acute distress HEENT NCAT CTAB Heart S1S2 no rub, regular Abdomen soft nontender nondistended Extremities she has trace edema ; LUE AVF with bruit and thrill  Psych normal mood and affect Neuro - alert and oriented x 3   Medications reviewed    Labs:  BMP Latest Ref Rng & Units 04/06/2019 04/05/2019 04/04/2019  Glucose 70 - 99 mg/dL 142(H) 226(H) 240(H)  BUN 8 - 23 mg/dL 45(H) 68(H) 37(H)  Creatinine 0.44 - 1.00 mg/dL 5.66(H) 7.94(H) 5.90(H)  BUN/Creat Ratio 6 - 22 (calc) - - -  Sodium 135 - 145 mmol/L 136 138 135  Potassium 3.5 - 5.1 mmol/L 3.9 4.3 4.0  Chloride 98 - 111 mmol/L 92(L) 92(L) 92(L)  CO2 22 - 32 mmol/L 26 24 25   Calcium 8.9 - 10.3 mg/dL 9.2 8.9 8.5(L)   Outpatient HD orders from [facility]:per nursing note [address] MWF 4h (has been attending [name] on TTS since last week) EDW 96kg 2K 2.5Ca LUE AVF 15g 400/600 Heparin infusion 1000u load + 1000u hourly Max UFR 1.5L/hr Epogen 8000u tiw  Assessment/Plan:   1. ESRD [facility] , LUE AVF  1. -normally on MWF schedule but has been TTS for the past week due to covid shift at [facility]   2. Need to transition back to [facility], next HD planned 1/16 3. No inpatient renal issues, can DC back to outpt HD when medically ready  2. COVID 19  PNA 1. remdesivir and decadron per primary team   3. Acute hypoxic resp failure 1. On RA 4. HTN - UF with HD as able 5. Anemia of CKD - on epogen.  aranesp 60 mcg IV once on Wednesday - ordered to start 1/13. Stable 6. CKD-BMD - hyperphos - follow on velphoro   7. DM2 - per primary team   Rexene Agent, MD 04/06/2019 9:49 AM

## 2019-04-06 NOTE — Progress Notes (Signed)
Pt ambulated with pulse ox in room using walker; O2 stayed at 90-91% and pt denied shortness of breath. Tolerated activity well.

## 2019-04-06 NOTE — TOC Initial Note (Signed)
Transition of Care [facility]) - Initial/Assessment Note    Patient Details  Name: Kathryn Beck MRN: [id number] Date of Birth: 09-Dec-1955  [facility] CM/SW Contact:    Ihor Gully, LCSW Phone Number: 04/06/2019, 2:41 PM  Clinical Narrative:                 Patient from home with spouse who is also COVID+. Admitted for Pneumonia due to COVID-19 virus. Patient ambulates with a walker. She is agreeable to  Patient attends [facility] in [place]. She will not have to change facilities as they are seeing patients COVID patient's per [place] at [facility]. Tonya request that patient's discharge summary and COVID+ results be sent to them at discharge.    Expected Discharge Plan: Home/Self Care Barriers to Discharge: Continued Medical Work up   Patient Goals and CMS Choice Patient states their goals for this hospitalization and ongoing recovery are:: return home.      Expected Discharge Plan and Services Expected Discharge Plan: Home/Self Care                                              Prior Living Arrangements/Services   Lives with:: Spouse Patient language and need for interpreter reviewed:: Yes Do you feel safe going back to the place where you live?: Yes      Need for Family Participation in Patient Care: Yes (Comment) Care giver support system in place?: Yes (comment) Current home services: DME(walker) Criminal Activity/Legal Involvement Pertinent to Current Situation/Hospitalization: No - Comment as needed  Activities of Daily Living Home Assistive Devices/Equipment: CPAP ADL Screening (condition at time of admission) Patient's cognitive ability adequate to safely complete daily activities?: Yes Is the patient deaf or have difficulty hearing?: No Does the patient have difficulty seeing, even when wearing glasses/contacts?: No Does the patient have difficulty concentrating, remembering, or making decisions?: No Patient able to express need  for assistance with ADLs?: Yes Does the patient have difficulty dressing or bathing?: No Independently performs ADLs?: Yes (appropriate for developmental age) Does the patient have difficulty walking or climbing stairs?: No Weakness of Legs: None Weakness of Arms/Hands: None  Permission Sought/Granted                  Emotional Assessment Appearance:: Appears stated age   Affect (typically observed): Appropriate Orientation: : Oriented to Self, Oriented to Place, Oriented to  Time, Oriented to Situation Alcohol / Substance Use: Not Applicable Psych Involvement: No (comment)  Admission diagnosis:  Hypoxia [R09.02] Pneumonia due to COVID-19 virus [U07.1, J12.82] Patient Active Problem List   Diagnosis Date Noted  . Hypoxia 04/03/2019  . Pneumonia due to COVID-19 virus 04/02/2019  . Neck pain 03/23/2019  . Hypotension 12/05/2018  . Weakness 12/05/2018  . Near syncope 12/05/2018  . Ischemic heart disease 09/25/2018  . Renal disease 08/22/2018  . Not currently working due to disabled status 06/13/2018  . ESRD on dialysis ([place]) 06/12/2018  . Hypertension associated with diabetes ([place]) 06/12/2018  . Depression, major, single episode, severe ([place]) 03/09/2018  . Adrenal mass, left ([place]) 11/09/2017  . MGUS (monoclonal gammopathy of unknown significance) 11/05/2017  . Chronic diastolic CHF (congestive heart failure) ([place]) 09/20/2017  . Bilateral leg weakness 09/09/2017  . Adrenal adenoma 09/03/2017  . Type 2 diabetes mellitus with diabetic neuropathy, unspecified ([place]) 08/18/2017  . Unsteady gait 07/11/2017  .  Recurrent falls 07/11/2017  . Anemia of chronic disease 03/24/2017  . History of colonic polyps 02/10/2017  . Dysphagia 11/05/2016  . IBS (irritable bowel syndrome) 02/04/2016  . Heat sensitivity 10/07/2014  . Cardiac murmur 01/17/2014  . Annual physical exam 01/15/2014  . Back pain with left-sided radiculopathy 09/18/2013  . Seasonal allergies 03/10/2013  . Lipoma  of back 12/22/2012  . GERD (gastroesophageal reflux disease) 08/22/2012  . Sleep apnea 06/02/2011  . CKD (chronic kidney disease) stage 4, GFR 15-29 ml/min (HCC) 01/13/2011  . Neuropathy 04/16/2010  . FATIGUE 07/24/2009  . LIVER FUNCTION TESTS, ABNORMAL, HX OF 05/08/2009  . LUPUS ERYTHEMATOSUS, DISCOID 06/05/2008  . Arm pain 05/30/2008  . Mixed hyperlipidemia 06/07/2007  . Morbid obesity ([place]) 06/07/2007  . Malignant hypertension 06/07/2007   PCP:  Fayrene Helper, MD Pharmacy:   [facility], [place] [address] [place] [address] Phone: [phone] Fax: [phone]     Social Determinants of Health (SDOH) Interventions    Readmission Risk Interventions No flowsheet data found.

## 2019-04-07 LAB — COMPREHENSIVE METABOLIC PANEL
ALT: 36 U/L (ref 0–44)
AST: 50 U/L — ABNORMAL HIGH (ref 15–41)
Albumin: 3.4 g/dL — ABNORMAL LOW (ref 3.5–5.0)
Alkaline Phosphatase: 115 U/L (ref 38–126)
BUN: 74 mg/dL — ABNORMAL HIGH (ref 8–23)
CO2: 24 mmol/L (ref 22–32)
Calcium: 9.3 mg/dL (ref 8.9–10.3)
Chloride: 93 mmol/L — ABNORMAL LOW (ref 98–111)
Creatinine, Ser: 7.68 mg/dL — ABNORMAL HIGH (ref 0.44–1.00)
GFR calc Af Amer: 6 mL/min — ABNORMAL LOW (ref >60)
GFR calc non Af Amer: 5 mL/min — ABNORMAL LOW (ref >60)
Glucose, Bld: 128 mg/dL — ABNORMAL HIGH (ref 70–99)
Potassium: 4.6 mmol/L (ref 3.5–5.1)
Sodium: 140 mmol/L (ref 135–145)
Total Bilirubin: 0.9 mg/dL (ref 0.3–1.2)
Total Protein: 8.4 g/dL — ABNORMAL HIGH (ref 6.5–8.1)

## 2019-04-07 LAB — CBC WITH DIFFERENTIAL/PLATELET
Abs Immature Granulocytes: 0.21 10*3/uL — ABNORMAL HIGH (ref 0.00–0.07)
Basophils Absolute: 0 10*3/uL (ref 0.0–0.1)
Basophils Relative: 0 %
Eosinophils Absolute: 0 10*3/uL (ref 0.0–0.5)
Eosinophils Relative: 0 %
HCT: 37 % (ref 36.0–46.0)
Hemoglobin: 11 g/dL — ABNORMAL LOW (ref 12.0–15.0)
Immature Granulocytes: 3 %
Lymphocytes Relative: 16 %
Lymphs Abs: 1.2 10*3/uL (ref 0.7–4.0)
MCH: 22.7 pg — ABNORMAL LOW (ref 26.0–34.0)
MCHC: 29.7 g/dL — ABNORMAL LOW (ref 30.0–36.0)
MCV: 76.3 fL — ABNORMAL LOW (ref 80.0–100.0)
Monocytes Absolute: 0.5 10*3/uL (ref 0.1–1.0)
Monocytes Relative: 6 %
Neutro Abs: 5.7 10*3/uL (ref 1.7–7.7)
Neutrophils Relative %: 75 %
Platelets: 647 10*3/uL — ABNORMAL HIGH (ref 150–400)
RBC: 4.85 MIL/uL (ref 3.87–5.11)
RDW: 17.5 % — ABNORMAL HIGH (ref 11.5–15.5)
WBC: 7.5 10*3/uL (ref 4.0–10.5)
nRBC: 0.9 % — ABNORMAL HIGH (ref 0.0–0.2)

## 2019-04-07 LAB — C-REACTIVE PROTEIN: CRP: 6.5 mg/dL — ABNORMAL HIGH (ref <1.0)

## 2019-04-07 LAB — GLUCOSE, CAPILLARY
Glucose-Capillary: 104 mg/dL — ABNORMAL HIGH (ref 70–99)
Glucose-Capillary: 157 mg/dL — ABNORMAL HIGH (ref 70–99)
Glucose-Capillary: 223 mg/dL — ABNORMAL HIGH (ref 70–99)

## 2019-04-07 LAB — D-DIMER, QUANTITATIVE: D-Dimer, Quant: 1.57 ug/mL-FEU — ABNORMAL HIGH (ref 0.00–0.50)

## 2019-04-07 LAB — FERRITIN: Ferritin: 2413 ng/mL — ABNORMAL HIGH (ref 11–307)

## 2019-04-07 MED ORDER — CLONIDINE HCL 0.3 MG PO TABS: 0.1500 mg | ORAL_TABLET | Freq: Two times a day (BID) | ORAL | 1 refills | Status: DC

## 2019-04-07 MED ORDER — METOPROLOL SUCCINATE ER 50 MG PO TB24: 50.0000 mg | ORAL_TABLET | Freq: Every day | ORAL | 3 refills | Status: DC

## 2019-04-07 MED ORDER — PREDNISONE 20 MG PO TABS: 40.0000 mg | ORAL_TABLET | Freq: Every day | ORAL | 0 refills | Status: DC

## 2019-04-07 MED ORDER — GUAIFENESIN ER 600 MG PO TB12: 600.0000 mg | ORAL_TABLET | Freq: Two times a day (BID) | ORAL | 0 refills | Status: DC

## 2019-04-07 NOTE — Progress Notes (Signed)
This nurse spoke with [name] (daughter) and explained D/C instructions and isolation precautions. Kathryn Beck verbalized understanding. Instructions and isolation discussed with patient, verbalized understating. To be transported to private vehicle via wheelchair. Will continue to monitor.

## 2019-04-07 NOTE — Discharge Instructions (Signed)
1)You are strongly advised to  to isolate for at least 21 days from the date of your diagnoses with COVID-19 infection--please always wear a mask if you have to go outside the house  2)Avoid ibuprofen/Advil/Aleve/Motrin/Goody Powders/Naproxen/BC powders/Meloxicam/Diclofenac/Indomethacin and other Nonsteroidal anti-inflammatory medications as these will make you more likely to bleed and can cause stomach ulcers, can also cause Kidney problems.   3) continue hemodialysis on Tuesdays Thursdays and Saturdays per your usual schedule  4) please note that there is been changes to your medications please take your medications as advised  5) on your hemodialysis days do not take amlodipine 10 mg before dialysis (hold amlodipine 10 mg on your hemodialysis days)

## 2019-04-07 NOTE — Discharge Summary (Signed)
Kathryn Beck, is a 64 y.o. female  DOB 1955-11-03  MRN [id number].  Admission date:  04/02/2019  Admitting Physician  Murlean Iba, MD  Discharge Date:  04/07/2019   Primary MD  Fayrene Helper, MD  Recommendations for primary care physician for things to follow:    1)You are strongly advised to  to isolate for at least 21 days from the date of your diagnoses with COVID-19 infection--please always wear a mask if you have to go outside the house  2)Avoid ibuprofen/Advil/Aleve/Motrin/Goody Powders/Naproxen/BC powders/Meloxicam/Diclofenac/Indomethacin and other Nonsteroidal anti-inflammatory medications as these will make you more likely to bleed and can cause stomach ulcers, can also cause Kidney problems.   3) continue hemodialysis on Tuesdays Thursdays and Saturdays per your usual schedule  4) please note that there is been changes to your medications please take your medications as advised  5) on your hemodialysis days do not take amlodipine 10 mg before dialysis (hold amlodipine 10 mg on your hemodialysis days)  Admission Diagnosis  Hypoxia [R09.02] Pneumonia due to COVID-19 virus [U07.1, J12.82]   Discharge Diagnosis  Hypoxia [R09.02] Pneumonia due to COVID-19 virus [U07.1, J12.82]    Principal Problem:   Pneumonia due to COVID-19 virus Active Problems:   Mixed hyperlipidemia   Morbid obesity ([place])   Sleep apnea   GERD (gastroesophageal reflux disease)   ESRD on dialysis ([place])   Hypertension associated with diabetes ([place])   Not currently working due to disabled status   Neuropathy   Ischemic heart disease   Weakness   Hypoxia   Acute respiratory disease due to COVID-19 virus      Past Medical History:  Diagnosis Date  . Acid reflux   . Anemia   . Arthritis   . Axillary masses    Soft tissue - status post excision  . Back pain   . Depression   . Diastolic heart  failure ([place])   . End-stage renal disease ([place])    TTHSat  in [place]  . Essential hypertension   . History of blood transfusion   . History of claustrophobia   . History of pneumonia 2019  . Ischemic heart disease    Abnormal Myoview April 2018 - medical therapy  . Mixed hyperlipidemia   . Obesity   . Pancreatitis   . Peritoneal dialysis catheter in place [facility])   . Sleep apnea    Noncompliant with CPAP  . Type 2 diabetes mellitus ([place])     Past Surgical History:  Procedure Laterality Date  . ABDOMINAL HYSTERECTOMY    . AV FISTULA PLACEMENT Left 09/02/2017   Procedure: creation of left arm ARTERIOVENOUS (AV) FISTULA;  Surgeon: Serafina Mitchell, MD;  Location: [facility] OR;  Service: Vascular;  Laterality: Left;  . COLONOSCOPY  2008   Dr. Oneida Alar: normal   . COLONOSCOPY N/A 12/18/2016   Dr. Oneida Alar: multiple tubular adenomas, internal hemorrhoids. Surveillance in 3 years   . ESOPHAGEAL DILATION N/A 10/13/2015   Procedure: ESOPHAGEAL DILATION;  Surgeon: Rogene Houston, MD;  [facility];  Service: Endoscopy;  Laterality: N/A;  . ESOPHAGOGASTRODUODENOSCOPY N/A 10/13/2015   Dr. Laural Golden: chronic gastritis on path, no H.pylori. Empiric dilation   . ESOPHAGOGASTRODUODENOSCOPY N/A 12/18/2016   Dr. Oneida Alar: mild gastritis. BRAVO study revealed uncontrolled GERD. Dysphagia secondary to uncontrolled reflux  . FOOT SURGERY Bilateral    "nerve"    . LEFT HEART CATH AND CORONARY ANGIOGRAPHY N/A 12/29/2018   Procedure: LEFT HEART CATH AND CORONARY ANGIOGRAPHY;  Surgeon: Jettie Booze, MD;  Location: [facility] CV LAB;  Service: Cardiovascular;  Laterality: N/A;  . LUNG BIOPSY    . MASS EXCISION Right 01/09/2013   Procedure: EXCISION OF NEOPLASM OF RIGHT  AXILLA  AND EXCISION OF NEOPLASM OF LEFT AXILLA;  Surgeon: Jamesetta So, MD;  Location: [facility] ORS;  Service: General;  Laterality: Right;  procedure end @ 08:23  . MYRINGOTOMY WITH TUBE PLACEMENT Bilateral 04/28/2017   Procedure:  BILATERAL MYRINGOTOMY WITH TUBE PLACEMENT;  Surgeon: Leta Baptist, MD;  Location: [facility];  Service: ENT;  Laterality: Bilateral;  . REVISION OF ARTERIOVENOUS GORETEX GRAFT Left 05/04/2018   Procedure: TRANSPOSITION OF CEPHALIC VEIN ARTERIOVENOUS FISTULA LEFT ARM;  Surgeon: Rosetta Posner, MD;  Location: [facility];  Service: Vascular;  Laterality: Left;  . SAVORY DILATION N/A 12/18/2016   Procedure: SAVORY DILATION;  Surgeon: Danie Binder, MD;  [facility];  Service: Endoscopy;  Laterality: N/A;       HPI  from the history and physical done on the day of admission:   - Kathryn Beck  is a 64 y.o. female, with history of hypertension, hyperlipidemia, type 2 diabetes mellitus, depression, chronic diastolic CHF, ESRD on peritoneal dialysis came to hospital with shortness of breath.  She was diagnosed with COVID-19 infection 5 days ago.  Patient is a poor historian, unable to provide any significant history.  She denies chest pain, has been coughing up phlegm.  She is on peritoneal dialysis at home. In the ED, lab work showed creatinine 7.70, BUN 31. Patient is requiring 2 L/min of oxygen via nasal cannula. Chest x-ray shows patchy bilateral bilateral airspace disease compatible with COVID-19 pneumonia      Hospital Course:   Brief Narrative:  64 y.o.female,with history of hypertension, hyperlipidemia, type 2 diabetes mellitus, depression, chronic diastolic CHF, ESRD onhemodialysiscame to hospital with shortness of breath and tested positive for Covid 19 infection several days prior to admission.   Was admitted with hypoxia.   Assessment & Plan: 1-acute hypoxemic respiratory failure secondary to COVID-19 infection No longer requiring oxygen at rest, dyspnea on exertion persist -Completed IV steroids and remdesivir (day #5 of 5) --discharge home on 04/07/2019 after completing 5 days of IV remdesivir -Continue vitamin C and zinc -Patient encouraged to use incentive spirometer/flutter  valve   2-end-stage renal disease on hemodialysis -Appreciate nephrology assistance and recommendation -Continue hemodialysis treatment per usual schedule -Hold amlodipine on hemodialysis days to avoid hypotension  3-anemia of chronic kidney disease -Continue Epogen and IV iron as per nephrology discretion. -No signs of overt bleeding. -Hemoglobin stable  4-type 2 diabetes mellitus with nephropathy -Worsening hyperglycemia in the setting of steroid use -Poor control appreciated with A1c of 8.7 -Continue insulin regimen, follow-up with PCP for further adjustment  5-gastroesophageal flux disease -Continue PPI  6-morbid obesity with sleep apnea -Body mass index is 38.23 kg/m. -Low calorie diet, portion control and lifestyle changes discussed with patient.  7-essential hypertension/CAD -Continue Imdur, metoprolol, clonidine and amlodipine -Hold amlodipine on hemodialysis days  DVT prophylaxis: Heparin Code Status: Full code Family Communication: No family at bedside. Disposition Plan: Remains inpatient, continue IV steroids and remdesivir; follow inflammatory markers.  Continue hemodialysis as per nephrology service recommendations. -Anticipate discharge home on 04/07/2019 after completing 5 days of IV remdesivir  Consultants:   Nephrology service  Discharge Condition: Stable  Follow UP--PCP as advised   Consults obtained -nephrology  Diet and Activity recommendation:  As advised  Discharge Instructions    Discharge Instructions    Call MD for:  difficulty breathing, headache or visual disturbances   Complete by: As directed    Call MD for:  extreme fatigue   Complete by: As directed    Call MD for:  persistant dizziness or light-headedness   Complete by: As directed    Call MD for:  persistant nausea and vomiting   Complete by: As directed    Call MD for:  severe uncontrolled pain   Complete by: As directed    Call MD for:  temperature >100.4    Complete by: As directed    Diet - low sodium heart healthy   Complete by: As directed    Discharge instructions   Complete by: As directed    1)You are strongly advised to  to isolate for at least 21 days from the date of your diagnoses with COVID-19 infection--please always wear a mask if you have to go outside the house  2)Avoid ibuprofen/Advil/Aleve/Motrin/Goody Powders/Naproxen/BC powders/Meloxicam/Diclofenac/Indomethacin and other Nonsteroidal anti-inflammatory medications as these will make you more likely to bleed and can cause stomach ulcers, can also cause Kidney problems.   3) continue hemodialysis on Tuesdays Thursdays and Saturdays per your usual schedule  4) please note that there is been changes to your medications please take your medications as advised  5) on your hemodialysis days do not take amlodipine 10 mg before dialysis (hold amlodipine 10 mg on your hemodialysis days)   Increase activity slowly   Complete by: As directed         Discharge Medications     Allergies as of 04/07/2019      Reactions   Ace Inhibitors Anaphylaxis, Swelling   Penicillins Itching, Swelling, Other (See Comments)   Did it involve swelling of the face/tongue/throat, SOB, or low BP? Unknown Did it involve sudden or severe rash/hives, skin peeling, or any reaction on the inside of your mouth or nose? Unknown Did you need to seek medical attention at a hospital or doctor's office? Unknown When did it last happen?years  If all above answers are "NO", may proceed with cephalosporin use.   Statins Other (See Comments)   elevated LFT's   Albuterol Swelling      Medication List    TAKE these medications   acetaminophen 500 MG tablet Commonly known as: TYLENOL Take 1,000 mg by mouth every 8 (eight) hours as needed for mild pain or fever.   allopurinol 100 MG tablet Commonly known as: ZYLOPRIM Take 100 mg by mouth daily.   amLODipine 10 MG tablet Commonly known as:  NORVASC TAKE 1 TABLET BY MOUTH ONCE DAILY.   cloNIDine 0.3 MG tablet Commonly known as: Catapres Take 0.5 tablets (0.15 mg total) by mouth 2 (two) times daily. What changed:   how much to take  how to take this  when to take this  additional instructions   Dexilant 60 MG capsule Generic drug: dexlansoprazole TAKE 1 CAPSULE IN THE MORNING WITH FOOD   diclofenac Sodium 1 % Gel Commonly known as: VOLTAREN  Apply 2 g topically daily as needed (for pain).   ezetimibe 10 MG tablet Commonly known as: ZETIA TAKE 1 TABLET BY MOUTH ONCE DAILY.   FreeStyle Libre 14 Day Sensor Misc 1 each by Does not apply route every 14 (fourteen) days. Change every 2 weeks   furosemide 80 MG tablet Commonly known as: LASIX Take 80 mg by mouth 2 (two) times daily.   gabapentin 300 MG capsule Commonly known as: NEURONTIN TAKE (1) CAPSULE BY MOUTH THREE TIMES DAILY What changed: See the new instructions.   GNP Aspirin Low Dose 81 MG EC tablet Generic drug: aspirin TAKE 1 TABLET BY MOUTH ONCE DAILY. What changed: how much to take   guaiFENesin 600 MG 12 hr tablet Commonly known as: MUCINEX Take 1 tablet (600 mg total) by mouth 2 (two) times daily.   HumaLOG KwikPen 200 UNIT/ML Sopn Generic drug: Insulin Lispro Inject 24-28 Units into the skin 3 (three) times daily before meals.   HYDROcodone-acetaminophen 5-325 MG tablet Commonly known as: NORCO/VICODIN Take 1 tablet by mouth daily as needed for moderate pain.   isosorbide mononitrate 30 MG 24 hr tablet Commonly known as: IMDUR TAKE 1 TABLET BY MOUTH ONCE DAILY.   metoprolol succinate 50 MG 24 hr tablet Commonly known as: TOPROL-XL Take 1 tablet (50 mg total) by mouth daily. Take with or immediately following a meal.   ondansetron 4 MG tablet Commonly known as: ZOFRAN Take 1 tablet (4 mg total) by mouth every 8 (eight) hours as needed for nausea or vomiting.   predniSONE 20 MG tablet Commonly known as: Deltasone Take 2 tablets  (40 mg total) by mouth daily with breakfast.   sertraline 50 MG tablet Commonly known as: ZOLOFT TAKE 1 TABLET BY MOUTH ONCE DAILY.   Tyler Aas FlexTouch 200 UNIT/ML Sopn Generic drug: Insulin Degludec Inject 50-56 Units into the skin See admin instructions. For use after dialysis What changed: additional instructions   [name] 500 MG chewable tablet Generic drug: sucroferric oxyhydroxide Chew 500 mg by mouth See admin instructions. Take 500 mg with each snack and meal   zolpidem 10 MG tablet Commonly known as: AMBIEN Take 10 mg by mouth at bedtime as needed for sleep.      Major procedures and Radiology Reports - PLEASE review detailed and final reports for all details, in brief -   DG Chest Portable 1 View  Result Date: 04/02/2019 CLINICAL DATA:  COVID positive.  Weakness, shortness of breath EXAM: PORTABLE CHEST 1 VIEW COMPARISON:  03/28/2019 FINDINGS: Patchy peripheral bilateral airspace opacities compatible with pneumonia. Low lung volumes. No visible effusions or pneumothorax. Heart is upper limits normal in size. No acute bony abnormality. IMPRESSION: Patchy peripheral bilateral airspace disease compatible with COVID pneumonia. Electronically Signed   By: Rolm Baptise M.D.   On: 04/02/2019 20:47   DG Chest Portable 1 View  Result Date: 03/28/2019 CLINICAL DATA:  Shortness of breath. EXAM: PORTABLE CHEST 1 VIEW COMPARISON:  December 05, 2018 FINDINGS: Mild, chronic appearing increased interstitial lung markings are seen. This is unchanged in appearance when compared to the prior study. Mild, stable linear scarring and/or atelectasis is seen within the left lung base. There is mild blunting of the left costophrenic angle. No pneumothorax is identified. The heart size and mediastinal contours are within normal limits. Degenerative changes seen throughout the thoracic spine. IMPRESSION: 1. Mild chronic appearing increased interstitial lung markings with mild stable left basilar linear  scarring and/or atelectasis. Electronically Signed   By: Virgina Norfolk  M.D.   On: 03/28/2019 21:31    Micro Results   No results found for this or any previous visit (from the past 240 hour(s)).  Today   Subjective    Yocheved Depner today has no new complaints -No dyspnea at rest          Patient has been seen and examined prior to discharge   Objective   Blood pressure (!) 163/72, pulse 82, temperature 98.3 F (36.8 C), resp. rate 17, weight 88.8 kg, SpO2 98 %.  No intake or output data in the 24 hours ending 04/07/19 1214  Exam Gen:- Awake Alert, no acute distress  HEENT:- [place].AT, No sclera icterus Neck-Supple Neck,No JVD,.  Lungs-  CTAB , good air movement bilaterally  CV- S1, S2 normal, regular Abd-  +ve B.Sounds, Abd Soft, No tenderness,    Extremity/Skin:- trace  edema,   good pulses Psych-affect is appropriate, oriented x3 Neuro-no new focal deficits, no tremors  MSK-left upper extremity AV fistula with positive bruit and thrill   Data Review   CBC w Diff:  Lab Results  Component Value Date   WBC 7.5 04/07/2019   HGB 11.0 (L) 04/07/2019   HCT 37.0 04/07/2019   PLT 647 (H) 04/07/2019   LYMPHOPCT 16 04/07/2019   MONOPCT 6 04/07/2019   EOSPCT 0 04/07/2019   BASOPCT 0 04/07/2019    CMP:  Lab Results  Component Value Date   NA 140 04/07/2019   K 4.6 04/07/2019   CL 93 (L) 04/07/2019   CO2 24 04/07/2019   BUN 74 (H) 04/07/2019   CREATININE 7.68 (H) 04/07/2019   CREATININE 3.74 (H) 08/23/2017   PROT 8.4 (H) 04/07/2019   ALBUMIN 3.4 (L) 04/07/2019   BILITOT 0.9 04/07/2019   ALKPHOS 115 04/07/2019   AST 50 (H) 04/07/2019   ALT 36 04/07/2019  .   Total Discharge time is about 33 minutes  Roxan Hockey M.D on 04/07/2019 at 12:14 PM  Go to [email or web address] -  for contact info  [facility] - Office  [phone]

## 2019-04-07 NOTE — Progress Notes (Signed)
[facility] Progress Note  Name: Kathryn Beck MRN: [id number] DOB: 12-02-1955   Subjective:  No new complaints I spoke with outpatient HD unit: [facility] in [place], they are now taking Covid 19 patients, she will receive treatment on [facility] at 12 PM   Intake/Output Summary (Last 24 hours) at 04/07/2019 0956 Last data filed at 04/06/2019 1100 Gross per 24 hour  Intake 180 ml  Output --  Net 180 ml    Vitals:  Vitals:   04/06/19 2107 04/06/19 2147 04/07/19 0414 04/07/19 0543  BP: (!) 175/80 (!) 156/72 (!) 161/73 (!) 163/72  Pulse: 81 80 79 82  Resp: 18  17   Temp: 98.2 F (36.8 C)  98.3 F (36.8 C)   TempSrc:      SpO2: 94%  98%   Weight:         Physical Exam:  General adult female in bed in no acute distress HEENT NCAT CTAB Heart S1S2 no rub, regular Abdomen soft nontender nondistended Extremities she has trace edema ; LUE AVF with bruit and thrill  Psych normal mood and affect Neuro - alert and oriented x 3   Medications reviewed    Labs:  BMP Latest Ref Rng & Units 04/07/2019 04/06/2019 04/05/2019  Glucose 70 - 99 mg/dL 128(H) 142(H) 226(H)  BUN 8 - 23 mg/dL 74(H) 45(H) 68(H)  Creatinine 0.44 - 1.00 mg/dL 7.68(H) 5.66(H) 7.94(H)  BUN/Creat Ratio 6 - 22 (calc) - - -  Sodium 135 - 145 mmol/L 140 136 138  Potassium 3.5 - 5.1 mmol/L 4.6 3.9 4.3  Chloride 98 - 111 mmol/L 93(L) 92(L) 92(L)  CO2 22 - 32 mmol/L 24 26 24   Calcium 8.9 - 10.3 mg/dL 9.3 9.2 8.9   Outpatient HD orders from [facility]:per nursing note [facility] MWF 4h (has been attending Kathryn Beck Covid HD on TTS since last week) EDW 96kg 2K 2.5Ca LUE AVF 15g 400/600 Heparin infusion 1000u load + 1000u hourly Max UFR 1.5L/hr Epogen 8000u tiw  Assessment/Plan:   1. ESRD [facility], LUE AVF  1. -normally on MWF schedule but has been TTS for the past week due to covid shift at [facility].   2. Next HD will be tomorrow as an outpatient, at [facility]  at 12 PM, patient is aware 3. Please notify us if patient is not discharged so that we can arrange follow-up dialysis  2. COVID 19 PNA 1. remdesivir and decadron per primary team, last dose today  3. Acute hypoxic resp failure 1. On RA 4. HTN - UF with HD as able 5. Anemia of CKD - on epogen.  aranesp 60 mcg IV once on Wednesday - ordered to start 1/13. Stable 6. CKD-BMD - hyperphos - follow on velphoro   7. DM2 - per primary team   Rexene Agent, MD 04/07/2019 9:55 AM

## 2019-04-07 NOTE — Progress Notes (Signed)
Sats on room air at rest 95%, decreased to 94% while ambulating on room air. No c/o SOB. Dr. Denton Brick made aware. Will continue to monitor.

## 2019-04-10 ENCOUNTER — Ambulatory Visit (INDEPENDENT_AMBULATORY_CARE_PROVIDER_SITE_OTHER): Payer: 59 | Admitting: Family Medicine

## 2019-04-10 ENCOUNTER — Other Ambulatory Visit: Payer: Self-pay

## 2019-04-10 VITALS — BP 163/72 | Ht 64.0 in | Wt 195.0 lb

## 2019-04-10 DIAGNOSIS — U071 COVID-19: Secondary | ICD-10-CM

## 2019-04-10 DIAGNOSIS — J189 Pneumonia, unspecified organism: Secondary | ICD-10-CM

## 2019-04-10 DIAGNOSIS — Z09 Encounter for follow-up examination after completed treatment for conditions other than malignant neoplasm: Secondary | ICD-10-CM | POA: Diagnosis not present

## 2019-04-10 NOTE — Patient Instructions (Addendum)
F/U in April as before, call if you need me before  Please eat regularly, small amounts  , and often, this will help to rebuild your strength as you fight the infection  Check temperature daily and send in result please  If you develop shortness of breath, need to return to the hospital  Self isolate for 3 weeks  Thanks for choosing [facility], we consider it a privelige to serve you.  10 Things You Can Do to Manage Your COVID-19 Symptoms at Home If you have possible or confirmed COVID-19: 1. Stay home from work and school. And stay away from other public places. If you must go out, avoid using any kind of public transportation, ridesharing, or taxis. 2. Monitor your symptoms carefully. If your symptoms get worse, call your healthcare provider immediately. 3. Get rest and stay hydrated. 4. If you have a medical appointment, call the healthcare provider ahead of time and tell them that you have or may have COVID-19. 5. For medical emergencies, call 911 and notify the dispatch personnel that you have or may have COVID-19. 6. Cover your cough and sneezes with a tissue or use the inside of your elbow. 7. Wash your hands often with soap and water for at least 20 seconds or clean your hands with an alcohol-based hand sanitizer that contains at least 60% alcohol. 8. As much as possible, stay in a specific room and away from other people in your home. Also, you should use a separate bathroom, if available. If you need to be around other people in or outside of the home, wear a mask. 9. Avoid sharing personal items with other people in your household, like dishes, towels, and bedding. 10. Clean all surfaces that are touched often, like counters, tabletops, and doorknobs. Use household cleaning sprays or wipes according to the label instructions. [email or web address] 09/21/2018 This information is not intended to replace advice given to you by your health care provider. Make sure you  discuss any questions you have with your health care provider. Document Revised: 02/23/2019 Document Reviewed: 02/23/2019 Elsevier Patient Education  [employer].

## 2019-04-10 NOTE — Progress Notes (Signed)
Virtual Visit via Telephone Note  I connected with Kathryn Beck on 04/10/19 at 10:00 AM EST by telephone and verified that I am speaking with the correct person using two identifiers.  Location: Patient: home Provider: office   I discussed the limitations, risks, security and privacy concerns of performing an evaluation and management service by telephone and the availability of in person appointments. I also discussed with the patient that there may be a patient responsible charge related to this service. The patient expressed understanding and agreed to proceed.   History of Present Illness: Hospital f/u visit for admission from 1/10 to 04/07/2019 with a dx of covid 19 infection with the complication of pneumonia. Recommendation ar d/c is a 3 week isolation period Still weak wih shortness of breath and cough, slowly improving, treated witn 5 day course of remdesivir in hospital and also IV steroids avised at d/c to hold amlodipine on dialysis days due to becoming hypotensive Hospital course reviewed and all questions answered   Observations/Objective: BP (!) 163/72   Ht 5\' 4"  (1.626 m)   Wt 195 lb (88.5 kg)   BMI 33.47 kg/m  Good communication with no confusion and intact memory. Alert and oriented x 3 No signs of respiratory distress during speech    Assessment and Plan: Hospital discharge follow-up Patient in for follow up of recent hospitalization. Discharge summary, and laboratory and radiology data are reviewed, and any questions or concerns about recent hospitalization are discussed. Specific issues requiring follow up are specifically addressed. Repeat CXR e first week in March for clearance     Follow Up Instructions:    I discussed the assessment and treatment plan with the patient. The patient was provided an opportunity to ask questions and all were answered. The patient agreed with the plan and demonstrated an understanding of the instructions.   The  patient was advised to call back or seek an in-person evaluation if the symptoms worsen or if the condition fails to improve as anticipated.  I provided 18 minutes of non-face-to-face time during this encounter.   Tula Nakayama, MD

## 2019-04-11 ENCOUNTER — Telehealth: Payer: Self-pay

## 2019-04-11 NOTE — Telephone Encounter (Signed)
[id number]  Attempted to contact patient to complete [facility] telephone call. No answer. VM full and unable to leave vm. Will try again later. 1st attempt

## 2019-04-11 NOTE — Telephone Encounter (Signed)
Transition Care Management Follow-up Telephone Call   Date discharged?  04/07/19              How have you been since you were released from the hospital? feeling a little better   Do you understand why you were in the hospital? covid   Do you understand the discharge instructions? yes   Where were you discharged to? home   Items Reviewed:  Medications reviewed: yes  Allergies reviewed: yes  Dietary changes reviewed: yes  Referrals reviewed: no new referrals   Functional Questionnaire:   Activities of Daily Living (ADLs):  has help if she needs it    Any transportation issues/concerns?: no   Any patient concerns? no   Confirmed importance and date/time of follow-up visits scheduled 04/14/2019 with DNP phone     Confirmed with patient if condition begins to worsen call PCP or go to the ER.  Patient was given the office number and encouraged to call back with question or concerns.  :  Yes with verbal understanding.

## 2019-04-12 ENCOUNTER — Telehealth: Payer: 59 | Admitting: Cardiology

## 2019-04-13 ENCOUNTER — Ambulatory Visit (HOSPITAL_COMMUNITY): Payer: 59 | Admitting: Nurse Practitioner

## 2019-04-13 ENCOUNTER — Other Ambulatory Visit (HOSPITAL_COMMUNITY): Payer: Self-pay | Admitting: Nurse Practitioner

## 2019-04-13 ENCOUNTER — Ambulatory Visit (HOSPITAL_COMMUNITY): Payer: 59

## 2019-04-13 ENCOUNTER — Other Ambulatory Visit (HOSPITAL_COMMUNITY): Payer: 59

## 2019-04-14 ENCOUNTER — Ambulatory Visit (INDEPENDENT_AMBULATORY_CARE_PROVIDER_SITE_OTHER): Payer: 59 | Admitting: Family Medicine

## 2019-04-14 ENCOUNTER — Other Ambulatory Visit: Payer: Self-pay

## 2019-04-14 ENCOUNTER — Telehealth: Payer: Self-pay

## 2019-04-14 ENCOUNTER — Encounter: Payer: Self-pay | Admitting: Family Medicine

## 2019-04-14 VITALS — BP 124/44 | HR 94 | Ht 64.0 in | Wt 195.0 lb

## 2019-04-14 DIAGNOSIS — Z7689 Persons encountering health services in other specified circumstances: Secondary | ICD-10-CM | POA: Insufficient documentation

## 2019-04-14 DIAGNOSIS — U071 COVID-19: Secondary | ICD-10-CM

## 2019-04-14 DIAGNOSIS — Z992 Dependence on renal dialysis: Secondary | ICD-10-CM

## 2019-04-14 DIAGNOSIS — N186 End stage renal disease: Secondary | ICD-10-CM | POA: Diagnosis not present

## 2019-04-14 DIAGNOSIS — E1140 Type 2 diabetes mellitus with diabetic neuropathy, unspecified: Secondary | ICD-10-CM | POA: Diagnosis not present

## 2019-04-14 DIAGNOSIS — I5032 Chronic diastolic (congestive) heart failure: Secondary | ICD-10-CM

## 2019-04-14 DIAGNOSIS — I952 Hypotension due to drugs: Secondary | ICD-10-CM

## 2019-04-14 DIAGNOSIS — R531 Weakness: Secondary | ICD-10-CM

## 2019-04-14 DIAGNOSIS — J1282 Pneumonia due to coronavirus disease 2019: Secondary | ICD-10-CM

## 2019-04-14 DIAGNOSIS — Z794 Long term (current) use of insulin: Secondary | ICD-10-CM

## 2019-04-14 DIAGNOSIS — E1122 Type 2 diabetes mellitus with diabetic chronic kidney disease: Secondary | ICD-10-CM

## 2019-04-14 MED ORDER — HUMALOG KWIKPEN 200 UNIT/ML ~~LOC~~ SOPN: 24.0000 [IU] | PEN_INJECTOR | Freq: Three times a day (TID) | SUBCUTANEOUS | 5 refills | Status: DC

## 2019-04-14 NOTE — Telephone Encounter (Signed)
Kathryn Beck is not covered, please advise.

## 2019-04-14 NOTE — Telephone Encounter (Signed)
We can switch to [employer]

## 2019-04-14 NOTE — Progress Notes (Signed)
Virtual Visit via Telephone Note   This visit type was conducted due to national recommendations for restrictions regarding the COVID-19 Pandemic (e.g. social distancing) in an effort to limit this patient's exposure and mitigate transmission in our community.  Due to her co-morbid illnesses, this patient is at least at moderate risk for complications without adequate follow up.  This format is felt to be most appropriate for this patient at this time.  The patient did not have access to video technology/had technical difficulties with video requiring transitioning to audio format only (telephone).  All issues noted in this document were discussed and addressed.  No physical exam could be performed with this format.   Evaluation Performed:  Follow-up visit  Date:  04/16/2019   ID:  Kathryn, Beck May 18, 1955, MRN [id number]  Patient Location: Home Provider Location: Office  Location of Patient: Home Location of Provider: Telehealth Consent was obtain for visit to be over via telehealth. I verified that I am speaking with the correct person using two identifiers.  PCP:  Fayrene Helper, MD   Chief Complaint:  TOC- pt in isolate can not come in to office at this time  History of Present Illness:    Kathryn Beck is a 64 y.o. female with history of hypertension, hyperlipidemia, type 2 diabetes mellitus, depression, chronic diastolic CHF, ESRD on peritoneal dialysis went to hospital with shortness of breath on 04/02/2019.  She was diagnosed with COVID-19 infection 5 days prior. She denies chest pain. Reported coughing up phlegm.  She is on peritoneal dialysis at home.  In the ED, lab work showed creatinine 7.70, BUN 31. Patient required 2 L/min of oxygen via nasal cannula. Chest x-ray shows patchy bilateral bilateral airspace disease compatible with COVID-19 pneumonia  Hospital course:  Acute hypoxic respiratory failure due to SARS COVID-19 pneumonia.  Completed her 5-day  course of remdesivir and steroids. She was able to get dialysis while in the hospital. Her history of hypertension she is on multiple hypertensives but she was found to be soft on admission.  It was decided that she should not take her amlodipine on the days that she has her dialysis.   Today she reports that she has been feeling the best that she has felt since she got sick.  She reports she is taking all her medications as directed.  Without any issues.  She does report elevation in her blood sugars fasting blood sugar this morning was over 200.  She reports that she is trying to get that to be better.  Was on steroids for several days in the hospital.  But that was almost 7 days ago now.  Hopefully trend will start to go down.  Last A1c was 8.7 so not as well-controlled as it could be.  She denies having any blood pressure issues and reports that she has been holding her amlodipine as directed. She has been self isolating and has only been wearing her mask when she has to go out.  She denies having any issues or concerns or trouble today.  The patient does not have symptoms concerning for COVID-19 infection (fever, chills, cough, or new shortness of breath).   Past Medical, Surgical, Social History, Allergies, and Medications have been Reviewed.  Past Medical History:  Diagnosis Date  . Acid reflux   . Anemia   . Arthritis   . Axillary masses    Soft tissue - status post excision  . Back pain   .  Depression   . Diastolic heart failure ([place])   . End-stage renal disease ([place])    TTHSat  in [place]  . Essential hypertension   . History of blood transfusion   . History of claustrophobia   . History of pneumonia 2019  . Ischemic heart disease    Abnormal Myoview April 2018 - medical therapy  . Mixed hyperlipidemia   . Obesity   . Pancreatitis   . Peritoneal dialysis catheter in place [facility])   . Sleep apnea    Noncompliant with CPAP  . Type 2 diabetes mellitus ([place])    Past  Surgical History:  Procedure Laterality Date  . ABDOMINAL HYSTERECTOMY    . AV FISTULA PLACEMENT Left 09/02/2017   Procedure: creation of left arm ARTERIOVENOUS (AV) FISTULA;  Surgeon: Serafina Mitchell, MD;  Location: [facility] OR;  Service: Vascular;  Laterality: Left;  . COLONOSCOPY  2008   Dr. Oneida Alar: normal   . COLONOSCOPY N/A 12/18/2016   Dr. Oneida Alar: multiple tubular adenomas, internal hemorrhoids. Surveillance in 3 years   . ESOPHAGEAL DILATION N/A 10/13/2015   Procedure: ESOPHAGEAL DILATION;  Surgeon: Rogene Houston, MD;  [facility];  Service: Endoscopy;  Laterality: N/A;  . ESOPHAGOGASTRODUODENOSCOPY N/A 10/13/2015   Dr. Laural Golden: chronic gastritis on path, no H.pylori. Empiric dilation   . ESOPHAGOGASTRODUODENOSCOPY N/A 12/18/2016   Dr. Oneida Alar: mild gastritis. BRAVO study revealed uncontrolled GERD. Dysphagia secondary to uncontrolled reflux  . FOOT SURGERY Bilateral    "nerve"    . LEFT HEART CATH AND CORONARY ANGIOGRAPHY N/A 12/29/2018   Procedure: LEFT HEART CATH AND CORONARY ANGIOGRAPHY;  Surgeon: Jettie Booze, MD;  Location: [place] CV LAB;  Service: Cardiovascular;  Laterality: N/A;  . LUNG BIOPSY    . MASS EXCISION Right 01/09/2013   Procedure: EXCISION OF NEOPLASM OF RIGHT  AXILLA  AND EXCISION OF NEOPLASM OF LEFT AXILLA;  Surgeon: Jamesetta So, MD;  Location: [facility] ORS;  Service: General;  Laterality: Right;  procedure end @ 08:23  . MYRINGOTOMY WITH TUBE PLACEMENT Bilateral 04/28/2017   Procedure: BILATERAL MYRINGOTOMY WITH TUBE PLACEMENT;  Surgeon: Leta Baptist, MD;  Location: [place];  Service: ENT;  Laterality: Bilateral;  . REVISION OF ARTERIOVENOUS GORETEX GRAFT Left 05/04/2018   Procedure: TRANSPOSITION OF CEPHALIC VEIN ARTERIOVENOUS FISTULA LEFT ARM;  Surgeon: Rosetta Posner, MD;  Location: [place];  Service: Vascular;  Laterality: Left;  . SAVORY DILATION N/A 12/18/2016   Procedure: SAVORY DILATION;  Surgeon: Danie Binder, MD;  [facility];  Service:  Endoscopy;  Laterality: N/A;     Current Meds  Medication Sig  . acetaminophen (TYLENOL) 500 MG tablet Take 1,000 mg by mouth every 8 (eight) hours as needed for mild pain or fever.   Marland Kitchen allopurinol (ZYLOPRIM) 100 MG tablet Take 100 mg by mouth daily.  Marland Kitchen amLODipine (NORVASC) 10 MG tablet TAKE 1 TABLET BY MOUTH ONCE DAILY. (Patient taking differently: Take 10 mg by mouth daily. )  . cloNIDine (CATAPRES) 0.3 MG tablet Take 0.5 tablets (0.15 mg total) by mouth 2 (two) times daily.  . Continuous Blood Gluc Sensor (FREESTYLE LIBRE 14 DAY SENSOR) MISC 1 each by Does not apply route every 14 (fourteen) days. Change every 2 weeks  . dexlansoprazole (DEXILANT) 60 MG capsule TAKE 1 CAPSULE IN THE MORNING WITH FOOD  . diclofenac Sodium (VOLTAREN) 1 % GEL Apply 2 g topically daily as needed (for pain).   Marland Kitchen ezetimibe (ZETIA) 10 MG tablet TAKE 1  TABLET BY MOUTH ONCE DAILY. (Patient taking differently: Take 10 mg by mouth daily. )  . furosemide (LASIX) 80 MG tablet Take 80 mg by mouth 2 (two) times daily.  Marland Kitchen gabapentin (NEURONTIN) 300 MG capsule TAKE (1) CAPSULE BY MOUTH THREE TIMES DAILY (Patient taking differently: Take 300 mg by mouth 3 (three) times daily. )  . GNP ASPIRIN LOW DOSE 81 MG EC tablet TAKE 1 TABLET BY MOUTH ONCE DAILY. (Patient taking differently: Take 81 mg by mouth daily. )  . guaiFENesin (MUCINEX) 600 MG 12 hr tablet Take 1 tablet (600 mg total) by mouth 2 (two) times daily.  Marland Kitchen HYDROcodone-acetaminophen (NORCO/VICODIN) 5-325 MG tablet Take 1 tablet by mouth daily as needed for moderate pain.  . Insulin Degludec (TRESIBA FLEXTOUCH) 200 UNIT/ML SOPN Inject 50-56 Units into the skin See admin instructions. For use after dialysis (Patient taking differently: Inject 50-56 Units into the skin See admin instructions. Takes daily in the morning but takes after dialysis on Tuesdays Thursdays and SaturdaysFor use after dialysis)  . isosorbide mononitrate (IMDUR) 30 MG 24 hr tablet TAKE 1 TABLET BY MOUTH  ONCE DAILY. (Patient taking differently: Take 30 mg by mouth daily. )  . metoprolol succinate (TOPROL-XL) 50 MG 24 hr tablet Take 1 tablet (50 mg total) by mouth daily. Take with or immediately following a meal.  . ondansetron (ZOFRAN) 4 MG tablet Take 1 tablet (4 mg total) by mouth every 8 (eight) hours as needed for nausea or vomiting.  . predniSONE (DELTASONE) 20 MG tablet Take 2 tablets (40 mg total) by mouth daily with breakfast.  . sertraline (ZOLOFT) 50 MG tablet TAKE 1 TABLET BY MOUTH ONCE DAILY. (Patient taking differently: Take 50 mg by mouth daily. )  . [name] 500 MG chewable tablet Chew 500 mg by mouth See admin instructions. Take 500 mg with each snack and meal  . zolpidem (AMBIEN) 10 MG tablet Take 10 mg by mouth at bedtime as needed for sleep.   . [DISCONTINUED] Insulin Lispro (HUMALOG KWIKPEN) 200 UNIT/ML SOPN Inject 24-28 Units into the skin 3 (three) times daily before meals.     Allergies:   Ace inhibitors, Penicillins, Statins, and Albuterol   ROS:   Please see the history of present illness.    All other systems reviewed and are negative.   Labs/Other Tests and Data Reviewed:    Recent Labs: 12/05/2018: Magnesium 1.9 04/03/2019: B Natriuretic Peptide 45.0 04/07/2019: ALT 36; BUN 74; Creatinine, Ser 7.68; Hemoglobin 11.0; Platelets 647; Potassium 4.6; Sodium 140   Recent Lipid Panel Lab Results  Component Value Date/Time   CHOL 199 03/08/2018 12:34 PM   TRIG 242 (H) 03/08/2018 12:34 PM   HDL 37 (L) 03/08/2018 12:34 PM   CHOLHDL 5.4 03/08/2018 12:34 PM   LDLCALC 114 (H) 03/08/2018 12:34 PM   LDLCALC 109 (H) 11/12/2017 09:36 AM   LDLDIRECT 107 05/19/2016 01:44 PM    Wt Readings from Last 3 Encounters:  04/14/19 195 lb (88.5 kg)  04/10/19 195 lb (88.5 kg)  04/05/19 195 lb 12.3 oz (88.8 kg)     Objective:    Vital Signs:  BP (!) 124/44   Pulse 94   Ht 5\' 4"  (1.626 m)   Wt 195 lb (88.5 kg)   BMI 33.47 kg/m    VITAL SIGNS:  reviewed GEN:  Alert and  oriented RESPIRATORY:  No shortness of breath noted in conversation, no cough PSYCH:  Affect at baseline, good communication  ASSESSMENT & PLAN:  1. Encounter for support and coordination of transition of care  2. ESRD on dialysis ([place])  3. Type 2 diabetes mellitus with diabetic neuropathy, with long-term current use of insulin ([place])  4. Chronic diastolic CHF (congestive heart failure) ([place])   5. Pneumonia due to COVID-19 virus  6. Hypotension due to drugs  7. Weakness   Time:   Today, I have spent 25 minutes with the patient with telehealth technology discussing the above problems.     Medication Adjustments/Labs and Tests Ordered: Current medicines are reviewed at length with the patient today.  Concerns regarding medicines are outlined above.   Tests Ordered: No orders of the defined types were placed in this encounter.   Medication Changes: No orders of the defined types were placed in this encounter.   Disposition:  Follow up as previously scheduled Signed, Perlie Mayo, NP  04/16/2019 6:40 PM     [facility]

## 2019-04-16 ENCOUNTER — Encounter: Payer: Self-pay | Admitting: Family Medicine

## 2019-04-16 NOTE — Patient Instructions (Signed)
Happy New Year! May you have a year filled with hope, love, happiness and laughter.  I appreciate the opportunity to provide you with care for your health and wellness. Today we discussed: recent admission  Follow up: 07/20/2019 as scheduled  No labs or referrals today  Please continue to practice social distancing to keep you, your family, and our community safe.  If you must go out, please wear a mask and practice good handwashing.  It was a pleasure to see you and I look forward to continuing to work together on your health and well-being. Please do not hesitate to call the office if you need care or have questions about your care.  Have a wonderful day and week. With Gratitude, Cherly Beach, DNP, AGNP-BC

## 2019-04-16 NOTE — Assessment & Plan Note (Signed)
Stable-continue to monitor. Fluid restriction. Continue all medications as ordered Heart healthy-low fat/salt diet. Walking encouraged 30 minutes 5 days a week

## 2019-04-16 NOTE — Assessment & Plan Note (Signed)
Treatment provided inpatient. She reports breathing better and starting to feel better. About to finish her isolation.

## 2019-04-17 NOTE — Assessment & Plan Note (Signed)
Stable, has had a refer for neuropathy concerns. Reports taking meds and checking sugars.  They appear to be running high still, as she reports. She is advised to continue current medications.  Continue to check her blood sugars on a regular basis.  Close follow-up possible need for adjustment of medications if lifestyle changes still improved.

## 2019-04-17 NOTE — Assessment & Plan Note (Signed)
Improving daily

## 2019-04-17 NOTE — Assessment & Plan Note (Signed)
Followed by nephrology.  Stable at this time.

## 2019-04-17 NOTE — Assessment & Plan Note (Signed)
And patient advised for her to not take amlodipine on her dialysis days.  She has continued to do this and reports that her blood pressures are staying stable at this time.  She was advised to continue doing this.  If she notices any changes she is advised to call the office as soon as possible.

## 2019-04-17 NOTE — Assessment & Plan Note (Signed)
Patient was treated inpatient for acute hypoxic respiratory failure secondary to SARS COVID-19.  Hypoxia has resolved, she is doing much better breathing better and overall feeling much better.  She is not requiring any oxygen. She is not taking her amlodipine on her dialysis days.  She continues to have problems with her blood sugars.  Last A1c was 8.7 so she is not well controlled prior to this incident either.  Steroids probably made this a little bit worse.  She denies having any changes or problems with her blood pressure at this time. No labs are needed continue to follow-up closely almost done with outs elation at home.

## 2019-04-18 MED ORDER — TOUJEO MAX SOLOSTAR 300 UNIT/ML ~~LOC~~ SOPN: 50.0000 [IU] | PEN_INJECTOR | SUBCUTANEOUS | 11 refills | Status: DC

## 2019-04-18 NOTE — Telephone Encounter (Signed)
[place] sent

## 2019-04-24 ENCOUNTER — Encounter: Payer: Self-pay | Admitting: Family Medicine

## 2019-04-24 NOTE — Assessment & Plan Note (Addendum)
Patient in for follow up of recent hospitalization. Discharge summary, and laboratory and radiology data are reviewed, and any questions or concerns about recent hospitalization are discussed. Specific issues requiring follow up are specifically addressed. Repeat CXR e first week in March for clearance

## 2019-05-03 ENCOUNTER — Other Ambulatory Visit: Payer: Self-pay

## 2019-05-04 ENCOUNTER — Ambulatory Visit (INDEPENDENT_AMBULATORY_CARE_PROVIDER_SITE_OTHER): Payer: 59 | Admitting: Internal Medicine

## 2019-05-04 ENCOUNTER — Encounter: Payer: Self-pay | Admitting: Internal Medicine

## 2019-05-04 VITALS — BP 130/60 | HR 97 | Ht 64.0 in | Wt 195.0 lb

## 2019-05-04 DIAGNOSIS — D3500 Benign neoplasm of unspecified adrenal gland: Secondary | ICD-10-CM | POA: Diagnosis not present

## 2019-05-04 DIAGNOSIS — E1122 Type 2 diabetes mellitus with diabetic chronic kidney disease: Secondary | ICD-10-CM | POA: Diagnosis not present

## 2019-05-04 DIAGNOSIS — E782 Mixed hyperlipidemia: Secondary | ICD-10-CM | POA: Diagnosis not present

## 2019-05-04 DIAGNOSIS — N186 End stage renal disease: Secondary | ICD-10-CM

## 2019-05-04 NOTE — Progress Notes (Signed)
Patient ID: Kathryn Beck, female   DOB: January 03, 1956, 64 y.o.   MRN: [id number]  This visit occurred during the SARS-CoV-2 public health emergency.  Safety protocols were in place, including screening questions prior to the visit, additional usage of staff PPE, and extensive cleaning of exam room while observing appropriate contact time as indicated for disinfecting solutions.   HPI: Kathryn Beck is a 64 y.o.-year-old female, initially referred by her PCP, Dr. Moshe Cipro, returning for follow-up for of DM2, dx in 2008, insulin-dependent since ~2015, uncontrolled, with complications (ESRD, PN). She saw Dr. Dorris Fetch - last OV with him 05/2016.  Last visit with me 4 months ago.  She is here with her sister who offers part of the history especially about her insulin doses, blood sugars, and past medical history.  Since last visit, she had COVID-19 + PNA last month.  She was hospitalized for hypoxia on 04/02/2019.  She was previously on peritoneal dialysis now on hemodialysis.  Adrenal masses.  Reviewed previous imaging reports: 05/28/2017: MRI abd: Bilateral adrenal nodules with signal dropout on out of phase imaging, consistent with adenomas.   Example at 2.5 cm on the left and on the order of 1.6 cm on the right.  10/10/2015: CT abdomen and pelvis without contrast: Stable 2.2 cm left adrenal nodule with Hounsfield unit measurements 28 unchanged likely a lipid poor adenoma  08/23/2008: MRI of the abdomen with and without contrast: Left adrenal mass measuring 1.6 x 1.5 cm (previously measuring 1.4 x 1.6 cm on the CT scan from 2003 -stable in size),  Of note, she has an enlarging pancreatic head mass - pt reports a h/o pancreatitis. (This will need to be eval. By GI)  Reviewed previous adrenal biochemical investigation:  Dexamethasone suppression test showed a low normal, nonsuppressed cortisol, but I suspect that this is due to her end-stage renal disease and a deficiency in cortisol clearance, rather  than decreased production. Component     Latest Ref Rng & Units 08/31/2018  Cortisol - AM     mcg/dL 6.1  Dexamethasone, Serum     ng/dL 446   A 24-hour urine cortisol could not be done as she is on dialysis  Previous investigation was negative for hyperaldosteronism or pheochromocytoma: Component     Latest Ref Rng & Units 10/04/2018          Epinephrine     pg/mL 24  Norepinephrine     pg/mL 167 (L)  Dopamine     pg/mL <10  Total Catecholamines     pg/mL 191 (L)  Metanephrine, Pl     <=57 pg/mL <25  Normetanephrine, Pl     <=148 pg/mL 38  Total Metanephrines-Plasma     <=205 pg/mL 38  ALDOSTERONE      ng/dL 2  Renin Activity     0.25 - 5.82 ng/mL/h 1.48  ALDO / PRA Ratio     0.9 - 28.9 Ratio 1.4  Potassium     3.5 - 5.1 mEq/L 4.9   Previously: Component     Latest Ref Rng & Units 11/30/2017  Epinephrine     pg/mL  undetectable  Norepinephrine     pg/mL 99  Dopamine     pg/mL  undetectable  Catecholamines, Total     pg/mL 99  Metanephrine, Pl     <=57 pg/mL <25  Normetanephrine, Pl     <=148 pg/mL 71  Total Metanephrines-Plasma     <=205 pg/mL 71  ALDOSTERONE  ng/dL 3  Renin Activity     0.25 - 5.82 ng/mL/h 1.20  ALDO / PRA Ratio     0.9 - 28.9 Ratio 2.5  Potassium     3.5 - 5.1 mEq/L 4.0   DM2: Reviewed HbA1c levels: Lab Results  Component Value Date   HGBA1C 8.7 (H) 04/02/2019   HGBA1C 7.9 (A) 01/31/2019   HGBA1C 6.8 (A) 08/31/2018   Pt was on a regimen of: - Humalog 75/25 20-26 units before each meal, 2-3X a day depending on the CBG before the meal  - Levemir 100 units at bedtime  Then on: - Tresiba 40 units daily after dialysis - Humalog 20 units before a smaller meal - Humalog 32 units before a larger meal Please do not take Humalog if sugars before a meal are <60. Otherwise, take it whenever you eat.   She is currently on: - Tresiba 50 units daily -she takes it before dialysis despite advice to take it after dialysis -  Toujeo 50-56 units after dialysis  - apparently this was added by another doctor (?  They cannot remember name) to be taken 3 times a week after dialysis - Humalog  24 units before a smaller meal 28 units before a larger meal Take Humalog EVERY TIME you eat, 15 min before the meal, if sugars are >60. - Humalog sliding scale: - 150-175: + 1 unit  - 176-200: + 2 units  - 201-225: + 3 units  - 226-250: + 4 units  - 251-275: + 5 units - 276-300: + 6 units  Pt checks her sugars 4 times a day per review of her log: - am: 68, 103-406, 530 >> 130, 144, 198-314 >> 57, 152-223, 241 - 2h after b'fast:  31, 95-373 >> n/c >> 143, 164, 384 - before lunch: 68, 125-323 >> 151-344, 450 >> 98-201, 334, 361 - 2h after lunch: n/c >> 178 >> 350 >> 100-224 - before dinner:  64-288, 340 >> n/c >> 218-367 >> 69, 106-194 - 2h after dinner: n/c >> 190-226 - bedtime: 167-242, 362 >> 90-207 >> n/c >> 209, 226 >> 124-201, 232 - nighttime: n/c  Lowest sugar was 31 >> 130 >> 57; it is unclear at which level she has hypoglycemia awareness. Highest sugar was HI >> 530 >> 499 >> 241  Glucometer: Accuchek  Pt's meals are: - Breakfast: eggs, bacon, oatmeal, grits - Lunch: salads - Dinner: meat + veggie or sandwich + salad or nabs - Snacks: nabs, fruit cups, jello  -+ ESRD, now on dialysis Lab Results  Component Value Date   BUN 74 (H) 04/07/2019   BUN 45 (H) 04/06/2019   CREATININE 7.68 (H) 04/07/2019   CREATININE 5.66 (H) 04/06/2019  Off olmesartan. -+ HL; last set of lipids: Lab Results  Component Value Date   CHOL 199 03/08/2018   HDL 37 (L) 03/08/2018   LDLCALC 114 (H) 03/08/2018   LDLDIRECT 107 05/19/2016   TRIG 242 (H) 03/08/2018   CHOLHDL 5.4 03/08/2018  Previously on Lipitor 40, now on Zetia. - last eye exam was on 2020: Reportedly no DR -No numbness but she has tingling in her feet.  She is on Neurontin 300 mg 3 times a day  She also has HTN.  ROS: Constitutional: no weight gain/no  weight loss, no fatigue, no subjective hyperthermia, no subjective hypothermia Eyes: no blurry vision, no xerophthalmia ENT: no sore throat, no nodules palpated in neck, no dysphagia, no odynophagia, no hoarseness Cardiovascular: no CP/no SOB/no palpitations/no  leg swelling Respiratory: no cough/no SOB/no wheezing Gastrointestinal: no N/no V/no D/no C/no acid reflux Musculoskeletal: no muscle aches/no joint aches Skin: no rashes, no hair loss Neurological: no tremors/no numbness/+ tingling/no dizziness  I reviewed pt's medications, allergies, PMH, social hx, family hx, and changes were documented in the history of present illness. Otherwise, unchanged from my initial visit note.  Past Medical History:  Diagnosis Date  . Acid reflux   . Anemia   . Arthritis   . Axillary masses    Soft tissue - status post excision  . Back pain   . Depression   . Diastolic heart failure ([place])   . End-stage renal disease ([place])    TTHSat  in [place]  . Essential hypertension   . History of blood transfusion   . History of claustrophobia   . History of pneumonia 2019  . Ischemic heart disease    Abnormal Myoview April 2018 - medical therapy  . Mixed hyperlipidemia   . Obesity   . Pancreatitis   . Peritoneal dialysis catheter in place [facility])   . Sleep apnea    Noncompliant with CPAP  . Type 2 diabetes mellitus ([place])    Past Surgical History:  Procedure Laterality Date  . ABDOMINAL HYSTERECTOMY    . AV FISTULA PLACEMENT Left 09/02/2017   Procedure: creation of left arm ARTERIOVENOUS (AV) FISTULA;  Surgeon: Serafina Mitchell, MD;  Location: [facility] OR;  Service: Vascular;  Laterality: Left;  . COLONOSCOPY  2008   Dr. Oneida Alar: normal   . COLONOSCOPY N/A 12/18/2016   Dr. Oneida Alar: multiple tubular adenomas, internal hemorrhoids. Surveillance in 3 years   . ESOPHAGEAL DILATION N/A 10/13/2015   Procedure: ESOPHAGEAL DILATION;  Surgeon: Rogene Houston, MD;  [facility];  Service: Endoscopy;   Laterality: N/A;  . ESOPHAGOGASTRODUODENOSCOPY N/A 10/13/2015   Dr. Laural Golden: chronic gastritis on path, no H.pylori. Empiric dilation   . ESOPHAGOGASTRODUODENOSCOPY N/A 12/18/2016   Dr. Oneida Alar: mild gastritis. BRAVO study revealed uncontrolled GERD. Dysphagia secondary to uncontrolled reflux  . FOOT SURGERY Bilateral    "nerve"    . LEFT HEART CATH AND CORONARY ANGIOGRAPHY N/A 12/29/2018   Procedure: LEFT HEART CATH AND CORONARY ANGIOGRAPHY;  Surgeon: Jettie Booze, MD;  Location: [place] CV LAB;  Service: Cardiovascular;  Laterality: N/A;  . LUNG BIOPSY    . MASS EXCISION Right 01/09/2013   Procedure: EXCISION OF NEOPLASM OF RIGHT  AXILLA  AND EXCISION OF NEOPLASM OF LEFT AXILLA;  Surgeon: Jamesetta So, MD;  Location: [facility] ORS;  Service: General;  Laterality: Right;  procedure end @ 08:23  . MYRINGOTOMY WITH TUBE PLACEMENT Bilateral 04/28/2017   Procedure: BILATERAL MYRINGOTOMY WITH TUBE PLACEMENT;  Surgeon: Leta Baptist, MD;  Location: [place];  Service: ENT;  Laterality: Bilateral;  . REVISION OF ARTERIOVENOUS GORETEX GRAFT Left 05/04/2018   Procedure: TRANSPOSITION OF CEPHALIC VEIN ARTERIOVENOUS FISTULA LEFT ARM;  Surgeon: Rosetta Posner, MD;  Location: [place];  Service: Vascular;  Laterality: Left;  . SAVORY DILATION N/A 12/18/2016   Procedure: SAVORY DILATION;  Surgeon: Danie Binder, MD;  [facility];  Service: Endoscopy;  Laterality: N/A;   Social History   Socioeconomic History  . Marital status: Married    Spouse name: Not on file  . Number of children: 2  Occupational History  . CNA  Tobacco Use  . Smoking status: Never Smoker  . Smokeless tobacco: Never Used  Substance and Sexual Activity  . Alcohol use: No  .  Drug use: No   Current Outpatient Medications on File Prior to Visit  Medication Sig Dispense Refill  . acetaminophen (TYLENOL) 500 MG tablet Take 1,000 mg by mouth every 8 (eight) hours as needed for mild pain or fever.     Marland Kitchen allopurinol (ZYLOPRIM) 100  MG tablet Take 100 mg by mouth daily.    Marland Kitchen amLODipine (NORVASC) 10 MG tablet TAKE 1 TABLET BY MOUTH ONCE DAILY. (Patient taking differently: Take 10 mg by mouth daily. ) 28 tablet 10  . cloNIDine (CATAPRES) 0.3 MG tablet Take 0.5 tablets (0.15 mg total) by mouth 2 (two) times daily. 30 tablet 1  . Continuous Blood Gluc Sensor (FREESTYLE LIBRE 14 DAY SENSOR) MISC 1 each by Does not apply route every 14 (fourteen) days. Change every 2 weeks 2 each 11  . dexlansoprazole (DEXILANT) 60 MG capsule TAKE 1 CAPSULE IN THE MORNING WITH FOOD 28 capsule 6  . diclofenac Sodium (VOLTAREN) 1 % GEL Apply 2 g topically daily as needed (for pain).     Marland Kitchen ezetimibe (ZETIA) 10 MG tablet TAKE 1 TABLET BY MOUTH ONCE DAILY. (Patient taking differently: Take 10 mg by mouth daily. ) 28 tablet 11  . furosemide (LASIX) 80 MG tablet Take 80 mg by mouth 2 (two) times daily.    Marland Kitchen gabapentin (NEURONTIN) 300 MG capsule TAKE (1) CAPSULE BY MOUTH THREE TIMES DAILY (Patient taking differently: Take 300 mg by mouth 3 (three) times daily. ) 84 capsule 3  . GNP ASPIRIN LOW DOSE 81 MG EC tablet TAKE 1 TABLET BY MOUTH ONCE DAILY. (Patient taking differently: Take 81 mg by mouth daily. ) 28 tablet 10  . guaiFENesin (MUCINEX) 600 MG 12 hr tablet Take 1 tablet (600 mg total) by mouth 2 (two) times daily. 20 tablet 0  . HYDROcodone-acetaminophen (NORCO/VICODIN) 5-325 MG tablet Take 1 tablet by mouth daily as needed for moderate pain.    . Insulin Glargine, 2 Unit Dial, (TOUJEO MAX SOLOSTAR) 300 UNIT/ML SOPN Inject 50-56 Units into the skin See admin instructions. Take after dialysis 3 day a week. 3 pen 11  . Insulin Lispro (HUMALOG KWIKPEN) 200 UNIT/ML SOPN Inject 24-28 Units into the skin 3 (three) times daily before meals. 30 mL 5  . isosorbide mononitrate (IMDUR) 30 MG 24 hr tablet TAKE 1 TABLET BY MOUTH ONCE DAILY. (Patient taking differently: Take 30 mg by mouth daily. ) 28 tablet 11  . metoprolol succinate (TOPROL-XL) 50 MG 24 hr tablet  Take 1 tablet (50 mg total) by mouth daily. Take with or immediately following a meal. 90 tablet 3  . ondansetron (ZOFRAN) 4 MG tablet Take 1 tablet (4 mg total) by mouth every 8 (eight) hours as needed for nausea or vomiting. 30 tablet 1  . predniSONE (DELTASONE) 20 MG tablet Take 2 tablets (40 mg total) by mouth daily with breakfast. 10 tablet 0  . sertraline (ZOLOFT) 50 MG tablet TAKE 1 TABLET BY MOUTH ONCE DAILY. (Patient taking differently: Take 50 mg by mouth daily. ) 28 tablet 4  . [name] 500 MG chewable tablet Chew 500 mg by mouth See admin instructions. Take 500 mg with each snack and meal    . zolpidem (AMBIEN) 10 MG tablet Take 10 mg by mouth at bedtime as needed for sleep.     . [DISCONTINUED] FLUoxetine (PROZAC) 10 MG capsule Take 10 mg by mouth daily.      . [DISCONTINUED] glipiZIDE (GLUCOTROL) 10 MG tablet Take 10 mg by mouth 2 (two) times daily  before a meal.       No current facility-administered medications on file prior to visit.   Allergies  Allergen Reactions  . Ace Inhibitors Anaphylaxis and Swelling  . Penicillins Itching, Swelling and Other (See Comments)    Did it involve swelling of the face/tongue/throat, SOB, or low BP? Unknown Did it involve sudden or severe rash/hives, skin peeling, or any reaction on the inside of your mouth or nose? Unknown Did you need to seek medical attention at a hospital or doctor's office? Unknown When did it last happen?years  If all above answers are "NO", may proceed with cephalosporin use.   . Statins Other (See Comments)    elevated LFT's    . Albuterol Swelling   Family History  Problem Relation Age of Onset  . Hypertension Father   . Hypercholesterolemia Father   . Arthritis Father   . Hypertension Sister   . Hypercholesterolemia Sister   . Breast cancer Sister   . Hypertension Sister   . Colon cancer Neg Hx   . Colon polyps Neg Hx     PE: BP 130/60   Pulse 97   Ht 5\' 4"  (1.626 m)   Wt 195 lb (88.5 kg)    SpO2 98%   BMI 33.47 kg/m  Wt Readings from Last 3 Encounters:  05/04/19 195 lb (88.5 kg)  04/14/19 195 lb (88.5 kg)  04/10/19 195 lb (88.5 kg)   Constitutional: overweight, in NAD Eyes: PERRLA, EOMI, no exophthalmos ENT: moist mucous membranes, no thyromegaly, no cervical lymphadenopathy Cardiovascular: Tachycardia RR, No RG, +1/6 SEM, + bilateral leg swelling, pitting Respiratory: CTA B Gastrointestinal: abdomen soft, NT, ND, BS+ Musculoskeletal: no deformities, strength intact in all 4 Skin: moist, warm, no rashes Neurological: no tremor with outstretched hands, DTR normal in all 4  ASSESSMENT: 1. DM2, insulin-dependent, uncontrolled, with complications - CKD stage 4 - PN  2. HL  3. B adrenal adenomas  PLAN:  1. Patient with longstanding, uncontrolled, type 2 diabetes, previously on a premixed insulin regimen and also long-acting insulin, now on basal-bolus insulin regimen.  Her sugars improved significantly after she started dialysis but they continue to be fluctuating.  In the past, they were between 75s and 500s, but at last visit sugars were higher possibly related to significant fluid retention and changing dialysis parameters.  We have moved her insulin after dialysis as she was before taking it prior to dialysis.  We did try to add a GLP-1 receptor agonist but she has a history of pancreatitis so I would not suggest this for now.  We increased her insulin at last visit but we discussed that after she gets rid of the fluid, they may need to be decreased.  Since last visit she was also switched from [place] to [place] per insurance preference.  At that time I sent a prescription for CGM to her pharmacy, but she was not able to get this. -At this visit we reviewed her latest HbA1c which was higher than before, at 8.7% last month. -Upon questioning, they tell me that patient is taking [place] in the morning before dialysis despite repeated advice to take it after dialysis!  I am  not sure how much of this is dialyzed off.  However, approximately 2 weeks ago, Toujeo was added by another doctor (?)  3 times a week after dialysis.  Therefore, in the dialysis days, she is taking both insulins!  She only had 1 low at 57 since last visit.  However, she  has many highs mostly before meals and it is possible that these were due to part of the [place] being dialyzed off before she started to show. -At this visit I discussed at length with patient that her sister did she can only be on one long-acting insulin per day and ideally this is taken after dialysis.  We will also increase the dose of long-acting insulin since her sugars are higher than target before meals.  After meals, she is mostly at or close to target.  Therefore, we will keep the Humalog dose the same. - I suggested to:  Patient Instructions  Please use either Toujeo OR Tresiba.  Increase: - Tresiba/Toujeo 56 units in the evening  Continue - Humalog  24 units before a smaller meal 28 units before a larger meal Take Humalog EVERY TIME you eat, 15 min before the meal, if sugars are >60. - Humalog sliding scale: - 150-175: + 1 unit  - 176-200: + 2 units  - 201-225: + 3 units  - 226-250: + 4 units  - 251-275: + 5 units - 276-300: + 6 units  Please return in 3 months with your sugar log.   - advised to check sugars at different times of the day - 3x a day, rotating check times - advised for yearly eye exams >> she is UTD - return to clinic in 3-4 months  2. HL -Reviewed latest lipid panel from 02/2018: LDL above goal, HDL low, triglycerides high Lab Results  Component Value Date   CHOL 199 03/08/2018   HDL 37 (L) 03/08/2018   LDLCALC 114 (H) 03/08/2018   LDLDIRECT 107 05/19/2016   TRIG 242 (H) 03/08/2018   CHOLHDL 5.4 03/08/2018  -Continue Zetia without side effects -We will check this at last visit along with her adrenal labs  3.  Bilateral adrenal adenoma -Patient with history of bilateral adrenal  adenomas per review of her abdominal MRI from 2019.  The imaging characteristics pointed towards benign tumors, of 2.5 and 1.6 cm, respectively.  However, they have appeared to increase in size over time.  Reviewing the MRI of the abdomen from 2010 and the CT of the abdomen and pelvis from 2017, only one nodule was seen in both, in the left adrenal. -We checked plasma catecholamines, metanephrines, and aldosterone and they were all normal in 11/2017 but her norepinephrine was slightly elevated, not specifically in 09/2018.  We will repeat this at next visit -Of note, her dexamethasone suppression test was abnormal but the cortisol was slightly high due to her end-stage renal disease and not actually increased cortisol production.  We cannot check a 24-hour urine cortisol as she is on dialysis.  We may need to late-night salivary cortisol checked at next visit if not checked by PCP-we will need records.  [name], MD PhD [facility]

## 2019-05-04 NOTE — Patient Instructions (Addendum)
Please use either Toujeo OR [place].  Increase: - Tresiba/Toujeo 56 units in the evening  Continue - Humalog  24 units before a smaller meal 28 units before a larger meal Take Humalog EVERY TIME you eat, 15 min before the meal, if sugars are >60. - Humalog sliding scale: - 150-175: + 1 unit  - 176-200: + 2 units  - 201-225: + 3 units  - 226-250: + 4 units  - 251-275: + 5 units - 276-300: + 6 units  Please return in 3 months with your sugar log.

## 2019-05-09 ENCOUNTER — Other Ambulatory Visit: Payer: Self-pay | Admitting: Family Medicine

## 2019-05-09 ENCOUNTER — Other Ambulatory Visit: Payer: Self-pay | Admitting: Cardiology

## 2019-05-09 DIAGNOSIS — M5410 Radiculopathy, site unspecified: Secondary | ICD-10-CM

## 2019-05-15 ENCOUNTER — Encounter: Payer: Self-pay | Admitting: Family Medicine

## 2019-05-15 ENCOUNTER — Ambulatory Visit (INDEPENDENT_AMBULATORY_CARE_PROVIDER_SITE_OTHER): Payer: 59 | Admitting: Family Medicine

## 2019-05-15 ENCOUNTER — Other Ambulatory Visit: Payer: Self-pay

## 2019-05-15 VITALS — BP 130/60 | Ht 64.0 in | Wt 195.0 lb

## 2019-05-15 DIAGNOSIS — Z794 Long term (current) use of insulin: Secondary | ICD-10-CM

## 2019-05-15 DIAGNOSIS — E559 Vitamin D deficiency, unspecified: Secondary | ICD-10-CM

## 2019-05-15 DIAGNOSIS — E1140 Type 2 diabetes mellitus with diabetic neuropathy, unspecified: Secondary | ICD-10-CM | POA: Diagnosis not present

## 2019-05-15 DIAGNOSIS — J1282 Pneumonia due to coronavirus disease 2019: Secondary | ICD-10-CM

## 2019-05-15 DIAGNOSIS — E782 Mixed hyperlipidemia: Secondary | ICD-10-CM | POA: Diagnosis not present

## 2019-05-15 DIAGNOSIS — E1159 Type 2 diabetes mellitus with other circulatory complications: Secondary | ICD-10-CM

## 2019-05-15 DIAGNOSIS — K219 Gastro-esophageal reflux disease without esophagitis: Secondary | ICD-10-CM

## 2019-05-15 DIAGNOSIS — M5410 Radiculopathy, site unspecified: Secondary | ICD-10-CM

## 2019-05-15 DIAGNOSIS — I10 Essential (primary) hypertension: Secondary | ICD-10-CM

## 2019-05-15 DIAGNOSIS — U071 COVID-19: Secondary | ICD-10-CM

## 2019-05-15 DIAGNOSIS — I152 Hypertension secondary to endocrine disorders: Secondary | ICD-10-CM

## 2019-05-15 MED ORDER — CYCLOBENZAPRINE HCL 5 MG PO TABS: ORAL_TABLET | ORAL | 2 refills | Status: DC

## 2019-05-15 MED ORDER — PREGABALIN 25 MG PO CAPS: 25.0000 mg | ORAL_CAPSULE | Freq: Two times a day (BID) | ORAL | 2 refills | Status: DC

## 2019-05-15 NOTE — Patient Instructions (Addendum)
F/U in April as before , call if you need me sooner  PLEASE schedule mammogram at checkout, past due   New for pain is lyrica twice daily, STOP gabapentin once you start this.  New for muscle spasm is bedtime flexril , if needed.  Hope that both these medications help with your symptoms  Please get fasting lipid, hepatic , tSH and vitamin D levels as soon as possible  Think about what you will eat, plan ahead. Choose " clean, green, fresh or frozen" over canned, processed or packaged foods which are more sugary, salty and fatty. 70 to 75% of food eaten should be vegetables and fruit. Three meals at set times with snacks allowed between meals, but they must be fruit or vegetables. Aim to eat over a 12 hour period , example 7 am to 7 pm, and STOP after  your last meal of the day. Drink water,generally about 64 ounces per day, no other drink is as healthy. Fruit juice is best enjoyed in a healthy way, by EATING the fruit. '  Thanks for choosing [facility], we consider it a privelige to serve you.

## 2019-05-15 NOTE — Progress Notes (Signed)
Virtual Visit via Telephone Note  I connected with Kathryn Beck on 05/15/19 at  3:40 PM EST by telephone and verified that I am speaking with the correct person using two identifiers.  Location: Patient: home  Provider: office   I discussed the limitations, risks, security and privacy concerns of performing an evaluation and management service by telephone and the availability of in person appointments. I also discussed with the patient that there may be a patient responsible charge related to this service. The patient expressed understanding and agreed to proceed.   History of Present Illness:   F/U chronic problems, medication review, and refill medication when necessary. Review most recent labs and order labs which are due Review preventive health and update with necessary referrals or immunizations as indicated Denies recent fever or chills. Denies sinus pressure, nasal congestion, ear pain or sore throat. Denies chest congestion, productive cough or wheezing. Denies chest pains, palpitations and leg swelling Denies abdominal pain, nausea, vomiting,diarrhea or constipation.   Denies dysuria, frequency, hesitancy or incontinence. C/o uncontrolled back and lower extremity pain with numbness and tingling, requests alternate pain management as gabapentin not effective Denies uncontrolled depression, anxiety or insomnia. Denies skin break down or rash. Denies polyuria, polydipsia, blurred vision , or hypoglycemic episodes.      Observations/Objective: BP 130/60   Ht 5\' 4"  (1.626 m)   Wt 195 lb (88.5 kg)   BMI 33.47 kg/m  Good communication with no confusion and intact memory. Alert and oriented x 3 No signs of respiratory distress during speech    Assessment and Plan: Back pain with left-sided radiculopathy Uncontrolled nerve pain, stop gabapentin, start lyrica  Type 2 diabetes mellitus with diabetic neuropathy, unspecified ([place]) Uncontrolled neuropathy , trial of  lyrica, stop gabapentin Kathryn Beck is reminded of the importance of commitment to daily physical activity for 30 minutes or more, as able and the need to limit carbohydrate intake to 30 to 60 grams per meal to help with blood sugar control.   The need to take medication as prescribed, test blood sugar as directed, and to call between visits if there is a concern that blood sugar is uncontrolled is also discussed.   Kathryn Beck is reminded of the importance of daily foot exam, annual eye examination, and good blood sugar, blood pressure and cholesterol control.  Diabetic Labs Latest Ref Rng & Units 04/07/2019 04/06/2019 04/05/2019 04/04/2019 04/03/2019  HbA1c 4.8 - 5.6 % - - - - -  Microalbumin Not estab mg/dL - - - - -  Micro/Creat Ratio <30 mcg/mg creat - - - - -  Chol 0 - 200 mg/dL - - - - -  HDL >40 mg/dL - - - - -  Calc LDL 0 - 99 mg/dL - - - - -  Triglycerides <150 mg/dL - - - - -  Creatinine 0.44 - 1.00 mg/dL 7.68(H) 5.66(H) 7.94(H) 5.90(H) 8.45(H)   BP/Weight 05/15/2019 05/04/2019 04/14/2019 04/10/2019 04/07/2019 3/41/9379 0/04/4095  Systolic BP 353 299 242 683 419 - 622  Diastolic BP 60 60 44 72 72 - 64  Wt. (Lbs) 195 195 195 195 - 195.77 169  BMI 33.47 33.47 33.47 33.47 - 38.23 33.01   Foot/eye exam completion dates Latest Ref Rng & Units 03/23/2019 12/20/2017  Eye Exam No Retinopathy - -  Foot Form Completion - Done Done   Uncontrolled and not at goal on 03/2019, managed by Endo     Pneumonia due to COVID-19 virus rept CXR for clearance still outstanding  Hypertension associated with diabetes ([place]) Controlled, no change in medication DASH diet and commitment to daily physical activity for a minimum of 30 minutes discussed and encouraged, as a part of hypertension management. The importance of attaining a healthy weight is also discussed.  BP/Weight 05/15/2019 05/04/2019 04/14/2019 04/10/2019 04/07/2019 08/07/9840 1/0/3128  Systolic BP 118 867 737 366 815 - 947  Diastolic BP 60 60  44 72 72 - 64  Wt. (Lbs) 195 195 195 195 - 195.77 169  BMI 33.47 33.47 33.47 33.47 - 38.23 33.01        GERD (gastroesophageal reflux disease) Controlled, no change in medication     Follow Up Instructions:    I discussed the assessment and treatment plan with the patient. The patient was provided an opportunity to ask questions and all were answered. The patient agreed with the plan and demonstrated an understanding of the instructions.   The patient was advised to call back or seek an in-person evaluation if the symptoms worsen or if the condition fails to improve as anticipated.  I provided 20 minutes of non-face-to-face time during this encounter.   Tula Nakayama, MD

## 2019-05-21 ENCOUNTER — Encounter: Payer: Self-pay | Admitting: Family Medicine

## 2019-05-21 NOTE — Assessment & Plan Note (Signed)
rept CXR for clearance still outstanding

## 2019-05-21 NOTE — Assessment & Plan Note (Signed)
Controlled, no change in medication DASH diet and commitment to daily physical activity for a minimum of 30 minutes discussed and encouraged, as a part of hypertension management. The importance of attaining a healthy weight is also discussed.  BP/Weight 05/15/2019 05/04/2019 04/14/2019 04/10/2019 04/07/2019 08/09/8020 05/23/6120  Systolic BP 449 753 005 110 211 - 173  Diastolic BP 60 60 44 72 72 - 64  Wt. (Lbs) 195 195 195 195 - 195.77 169  BMI 33.47 33.47 33.47 33.47 - 38.23 33.01

## 2019-05-21 NOTE — Assessment & Plan Note (Signed)
Controlled, no change in medication  

## 2019-05-21 NOTE — Assessment & Plan Note (Signed)
Uncontrolled nerve pain, stop gabapentin, start lyrica

## 2019-05-21 NOTE — Assessment & Plan Note (Signed)
Uncontrolled neuropathy , trial of lyrica, stop gabapentin Kathryn Beck is reminded of the importance of commitment to daily physical activity for 30 minutes or more, as able and the need to limit carbohydrate intake to 30 to 60 grams per meal to help with blood sugar control.   The need to take medication as prescribed, test blood sugar as directed, and to call between visits if there is a concern that blood sugar is uncontrolled is also discussed.   Kathryn Beck is reminded of the importance of daily foot exam, annual eye examination, and good blood sugar, blood pressure and cholesterol control.  Diabetic Labs Latest Ref Rng & Units 04/07/2019 04/06/2019 04/05/2019 04/04/2019 04/03/2019  HbA1c 4.8 - 5.6 % - - - - -  Microalbumin Not estab mg/dL - - - - -  Micro/Creat Ratio <30 mcg/mg creat - - - - -  Chol 0 - 200 mg/dL - - - - -  HDL >40 mg/dL - - - - -  Calc LDL 0 - 99 mg/dL - - - - -  Triglycerides <150 mg/dL - - - - -  Creatinine 0.44 - 1.00 mg/dL 7.68(H) 5.66(H) 7.94(H) 5.90(H) 8.45(H)   BP/Weight 05/15/2019 05/04/2019 04/14/2019 04/10/2019 04/07/2019 0/63/0160 1/0/9323  Systolic BP 557 322 025 427 062 - 376  Diastolic BP 60 60 44 72 72 - 64  Wt. (Lbs) 195 195 195 195 - 195.77 169  BMI 33.47 33.47 33.47 33.47 - 38.23 33.01   Foot/eye exam completion dates Latest Ref Rng & Units 03/23/2019 12/20/2017  Eye Exam No Retinopathy - -  Foot Form Completion - Done Done   Uncontrolled and not at goal on 03/2019, managed by Endo

## 2019-05-25 ENCOUNTER — Ambulatory Visit (HOSPITAL_COMMUNITY)
Admission: RE | Admit: 2019-05-25 | Discharge: 2019-05-25 | Disposition: A | Discharge status: Home / Self Care | Payer: 59 | Source: Ambulatory Visit | Attending: Family Medicine | Admitting: Family Medicine

## 2019-05-25 ENCOUNTER — Other Ambulatory Visit: Payer: Self-pay

## 2019-05-25 DIAGNOSIS — J189 Pneumonia, unspecified organism: Secondary | ICD-10-CM

## 2019-05-25 DIAGNOSIS — Z1239 Encounter for other screening for malignant neoplasm of breast: Secondary | ICD-10-CM | POA: Insufficient documentation

## 2019-05-25 DIAGNOSIS — U071 COVID-19: Secondary | ICD-10-CM

## 2019-05-25 DIAGNOSIS — M542 Cervicalgia: Secondary | ICD-10-CM

## 2019-05-29 ENCOUNTER — Telehealth: Payer: Self-pay

## 2019-05-29 NOTE — Telephone Encounter (Signed)
Results given to patient

## 2019-05-29 NOTE — Telephone Encounter (Signed)
Pt is returning your call

## 2019-06-05 NOTE — Telephone Encounter (Signed)
error 

## 2019-06-07 ENCOUNTER — Other Ambulatory Visit: Payer: Self-pay | Admitting: Family Medicine

## 2019-06-13 DIAGNOSIS — G5600 Carpal tunnel syndrome, unspecified upper limb: Secondary | ICD-10-CM | POA: Insufficient documentation

## 2019-06-13 DIAGNOSIS — E1049 Type 1 diabetes mellitus with other diabetic neurological complication: Secondary | ICD-10-CM | POA: Insufficient documentation

## 2019-06-13 DIAGNOSIS — G894 Chronic pain syndrome: Secondary | ICD-10-CM | POA: Insufficient documentation

## 2019-06-20 ENCOUNTER — Inpatient Hospital Stay (HOSPITAL_COMMUNITY): Payer: 59 | Attending: Hematology

## 2019-06-20 ENCOUNTER — Ambulatory Visit (HOSPITAL_COMMUNITY): Payer: 59 | Admitting: Nurse Practitioner

## 2019-06-29 ENCOUNTER — Other Ambulatory Visit: Payer: Self-pay | Admitting: Family Medicine

## 2019-07-04 ENCOUNTER — Ambulatory Visit (INDEPENDENT_AMBULATORY_CARE_PROVIDER_SITE_OTHER): Payer: 59 | Admitting: Family Medicine

## 2019-07-04 ENCOUNTER — Other Ambulatory Visit: Payer: Self-pay

## 2019-07-04 ENCOUNTER — Encounter: Payer: Self-pay | Admitting: Family Medicine

## 2019-07-04 VITALS — BP 139/68 | HR 90 | Temp 97.8°F | Wt 195.0 lb

## 2019-07-04 DIAGNOSIS — M5410 Radiculopathy, site unspecified: Secondary | ICD-10-CM | POA: Diagnosis not present

## 2019-07-04 MED ORDER — CYCLOBENZAPRINE HCL 5 MG PO TABS: ORAL_TABLET | ORAL | 1 refills | Status: DC

## 2019-07-04 NOTE — Progress Notes (Addendum)
Established Patient Office Visit  Subjective:  Patient ID: Kathryn Beck, female    DOB: [birth year]  Age: 64 y.o. MRN: [id number] Prescriptions Total Prescriptions: 26   Total Private Pay: 0   Fill Date ID   Written Drug Qty Days Prescriber Rx # Pharmacy Refill   Daily Dose* Pymt Type PMP    06/08/2019  2   05/15/2019  Pregabalin 25 MG Capsule  56.00  28 Ma Sim   [id number]   [place] ([id number])   1  0.34 LME  Comm Ins   [place]  05/23/2019  3   05/23/2019  Hydrocodone-Acetamin 5-325 MG  20.00  5 Br Sha   [id number]   [place] ([id number])   0  20.00 MME  Comm Ins   [place]  05/16/2019  2   05/15/2019  Pregabalin 25 MG Capsule  42.00  21 Ma Sim   [id number]   [place] ([id number])   0  0.34 LME  Comm Ins   [place]  03/02/2019  3   03/02/2019  Hydrocodone-Acetamin 5-325 MG  15.00  5 Br Sha   [id number]   [place] ([id number])   0  15.00 MME  Comm Ins   [place]  12/20/2018  3   12/20/2018  Hydrocodone-Acetamin 5-325 MG  15.00  5 Br Sha   [id number]   [place] ([id number])   0  15.00 MME  Comm Ins   [place]  11/17/2018  2   10/24/2018  Zolpidem Tartrate 10 MG Tablet  30.00  30 Ko [place]   [id number]   Nor ([id number])   0  0.50 LME  Comm Ins   [place]  09/29/2018  2   07/12/2018  Zolpidem Tartrate 10 MG Tablet  30.00  30 [place]   [id number]   [place] ([id number])   2  0.50 LME  Comm Ins   [place]  09/29/2018  2   05/13/2018  Tramadol Hcl 50 MG Tablet  40.00  20 Ma Sim   [id number]   [place] ([id number])   5  10.00 MME  Comm Ins   [place]  09/20/2018  3   09/20/2018  Hydrocodone-Acetamin 5-325 MG  30.00  30 Ma Sim   [id number]   [place] ([id number])   0  5.00 MME  Comm Ins   [place]  08/19/2018  2   05/13/2018  Tramadol Hcl 50 MG Tablet  40.00  20 Ma Sim   [id number]   [place] ([id number])   4  10.00 MME  Comm Ins   [place]  08/19/2018  2   07/12/2018  Zolpidem Tartrate 10 MG Tablet  30.00  30 [place]   [id number]   [place] ([id number])   1  0.50 LME  Comm Ins   [place]  07/29/2018  2   05/13/2018  Tramadol Hcl 50 MG Tablet  40.00  20 Ma Sim   [id number]   Nor ([id number])   3  10.00 MME  Comm Ins   [place]  07/13/2018  2   07/12/2018  Zolpidem Tartrate 10 MG Tablet  30.00  30 [place]   [id number]    Nor ([id number])   0  0.50 LME  Comm Ins   [place]  06/27/2018  2   05/13/2018  Tramadol Hcl 50 MG Tablet  40.00  20 Ma Sim   [id number]   Nor ([id number])   2  10.00 MME       CC:  Chief Complaint  Patient presents with  . Sciatica    pain run down  left butt cheek down leg. This has been going on for 3 week now. Hx of pinched nerve. 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 L1-2: Negative. L2-3: Negative. L3-4: Small broad-based disc bulge without neural impingement. Focal slight arthritic changes of the left facet joint. Widely patent neural foramina. No spinal stenosis. L4-5: Small broad-based disc bulge with no neural impingement. No foraminal or spinal stenosis. L5-S1: Vestigial disc. No neural impingement. 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 IMPRESSION: No significant abnormality of the lumbar spine. Focal slight facet arthritis at L3-4 to the left. 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 HPI ALIESE Beck presents for back pain-radiation into the legs and buttocks-pain with standing and sitting and walking-pt taking tylenol 500mg  (2)/day to (4) /day. Pt has not experienced a change in bladder or bowel symptoms. Pt with long term concerns with MRI completed 1/20 Dr. Harrison-arthritis-pt can not take NSAIDs due to dialysis, injection left shoulder due to pain Dr. Elvina Mattes started twice a day at higher dose then pt states discontinued, Lyrica started by Dr. Moshe Cipro BID but does not seem to be helping symptoms either- feet tingling or back pain apin, flexeril taken in the past-ran out a week ago, Voltaren gel, icy hot Home dialysis daily  Pt completed 4 weeks of PT for back pain prior to surgery for revision of dialysis cath ith no improvement Past Medical History:   Diagnosis Date  . Acid reflux   . Anemia   . Arthritis   . Axillary masses    Soft tissue - status post excision  . Back pain   . COVID-19 virus infection 04/06/2019  . Depression   . Diastolic heart failure ([place])   . End-stage renal disease ([place])    TTHSat  in [place]  . Essential hypertension   . History of blood transfusion   . History of claustrophobia   . History of pneumonia 2019  . Hypoxia 04/03/2019  . Ischemic heart disease    Abnormal Myoview April 2018 - medical therapy  . Mixed hyperlipidemia   . Obesity   . Pancreatitis   . Peritoneal dialysis catheter in place [facility])   . Sleep apnea    Noncompliant with CPAP  . Type 2 diabetes mellitus ([place])     Past Surgical History:  Procedure Laterality Date  . ABDOMINAL HYSTERECTOMY    . AV FISTULA PLACEMENT Left 09/02/2017   Procedure: creation of left arm ARTERIOVENOUS (AV) FISTULA;  Surgeon: Serafina Mitchell, MD;  Location[facility] OR;  Service: Vascular;  Laterality: Left;  . COLONOSCOPY  2008   Dr. Oneida Alar: normal   . COLONOSCOPY N/A 12/18/2016   Dr. Oneida Alar: multiple tubular adenomas, internal hemorrhoids. Surveillance in 3 years   . ESOPHAGEAL DILATION N/A 10/13/2015   Procedure: ESOPHAGEAL DILATION;  Surgeon: Rogene Houston, MD;  Location: [facility] ENDO SUITE;  Service: Endoscopy;  Laterality: N/A;  . ESOPHAGOGASTRODUODENOSCOPY N/A 10/13/2015   Dr. Laural Golden: chronic gastritis on path, no H.pylori. Empiric dilation   . ESOPHAGOGASTRODUODENOSCOPY N/A 12/18/2016   Dr. Oneida Alar: mild gastritis. BRAVO study revealed uncontrolled GERD. Dysphagia secondary to uncontrolled reflux  . FOOT SURGERY Bilateral    "nerve"    . LEFT HEART CATH AND CORONARY ANGIOGRAPHY N/A 12/29/2018   Procedure: LEFT HEART CATH AND CORONARY ANGIOGRAPHY;  Surgeon: Jettie Booze, MD;  Location: [place] CV LAB;  Service: Cardiovascular;  Laterality: N/A;  . LUNG BIOPSY    . MASS EXCISION Right 01/09/2013   Procedure: EXCISION OF NEOPLASM OF RIGHT   AXILLA  AND EXCISION OF NEOPLASM OF LEFT AXILLA;  Surgeon: Jamesetta So, MD;  Location: [facility] ORS;  Service: General;  Laterality: Right;  procedure end @ 08:23  . MYRINGOTOMY WITH TUBE PLACEMENT Bilateral 04/28/2017   Procedure: BILATERAL MYRINGOTOMY WITH TUBE PLACEMENT;  Surgeon: Leta Baptist, MD;  Location: [place];  Service: ENT;  Laterality: Bilateral;  . REVISION OF ARTERIOVENOUS GORETEX GRAFT Left 05/04/2018   Procedure: TRANSPOSITION OF CEPHALIC VEIN ARTERIOVENOUS FISTULA LEFT ARM;  Surgeon: Rosetta Posner, MD;  Location: [place];  Service: Vascular;  Laterality: Left;  . SAVORY DILATION N/A 12/18/2016   Procedure: SAVORY DILATION;  Surgeon: Danie Binder, MD;  Location: [facility];  Service: Endoscopy;  Laterality: N/A;    Family History  Problem Relation Age of Onset  . Hypertension Father   . Hypercholesterolemia Father   . Arthritis Father   . Hypertension Sister   . Hypercholesterolemia Sister   . Breast cancer Sister   . Hypertension Sister   . Colon cancer Neg Hx   . Colon polyps Neg Hx     Social History   Socioeconomic History  . Marital status: Married    Spouse name: larry   . Number of children: 2  . Years of education: 2  . Highest education level: 12th grade  Occupational History  . Occupation: retired   Tobacco Use  . Smoking status: Never Smoker  . Smokeless tobacco: Never Used  Substance and Sexual Activity  . Alcohol use: No  . Drug use: No  . Sexual activity: Not Currently  Other Topics Concern  . Not on file  Social History Narrative   Lives alone with husband    Social Determinants of Health   Financial Resource Strain:   . Difficulty of Paying Living Expenses:   Food Insecurity:   . Worried About Charity fundraiser in the Last Year:   . Arboriculturist in the Last Year:   Transportation Needs:   . Film/video editor (Medical):   Marland Kitchen Lack of Transportation (Non-Medical):   Physical Activity:   . Days of Exercise per Week:   . Minutes of  Exercise per Session:   Stress:   . Feeling of Stress :   Social Connections:   . Frequency of Communication with Friends and Family:   . Frequency of Social Gatherings with Friends and Family:   . Attends Religious Services:   . Active Member of Clubs or Organizations:   . Attends Archivist Meetings:   Marland Kitchen Marital Status:   Intimate Partner Violence:   . Fear of Current or Ex-Partner:   . Emotionally Abused:   Marland Kitchen Physically Abused:   . Sexually Abused:     Outpatient Medications Prior to Visit  Medication Sig Dispense Refill  . acetaminophen (TYLENOL) 500 MG tablet Take 1,000 mg by mouth every 8 (eight) hours as needed for mild pain or fever.     Marland Kitchen allopurinol (ZYLOPRIM) 100 MG tablet Take 100 mg by mouth daily.    Marland Kitchen amLODipine (NORVASC) 10 MG tablet TAKE 1 TABLET BY MOUTH ONCE DAILY. (Patient taking differently: Take 10 mg by mouth daily. ) 28 tablet 10  . cloNIDine (CATAPRES) 0.3 MG tablet TAKE (1/2) TABLET BY MOUTH TWICE DAILY. 28 tablet 11  .  Continuous Blood Gluc Sensor (FREESTYLE LIBRE 14 DAY SENSOR) MISC 1 each by Does not apply route every 14 (fourteen) days. Change every 2 weeks 2 each 11  . cyclobenzaprine (FLEXERIL) 5 MG tablet TAKE ONE TABLET BY MOUTH AT BEDTIME AS NEEDED FOR MUSCLE SPASMS. 30 tablet 0  . dexlansoprazole (DEXILANT) 60 MG capsule TAKE 1 CAPSULE IN THE MORNING WITH FOOD 28 capsule 6  . diclofenac Sodium (VOLTAREN) 1 % GEL Apply 2 g topically daily as needed (for pain).     Marland Kitchen ezetimibe (ZETIA) 10 MG tablet TAKE 1 TABLET BY MOUTH ONCE DAILY. (Patient taking differently: Take 10 mg by mouth daily. ) 28 tablet 11  . furosemide (LASIX) 80 MG tablet Take 80 mg by mouth 2 (two) times daily.    . GNP ASPIRIN LOW DOSE 81 MG EC tablet TAKE 1 TABLET BY MOUTH ONCE DAILY. 30 tablet 11  . guaiFENesin (MUCINEX) 600 MG 12 hr tablet Take 1 tablet (600 mg total) by mouth 2 (two) times daily. 20 tablet 0  . Insulin Glargine, 2 Unit Dial, (TOUJEO MAX SOLOSTAR) 300  UNIT/ML SOPN Inject 50-56 Units into the skin See admin instructions. Take after dialysis 3 day a week. 3 pen 11  . Insulin Lispro (HUMALOG KWIKPEN) 200 UNIT/ML SOPN Inject 24-28 Units into the skin 3 (three) times daily before meals. 30 mL 5  . isosorbide mononitrate (IMDUR) 30 MG 24 hr tablet TAKE 1 TABLET BY MOUTH ONCE DAILY. (Patient taking differently: Take 30 mg by mouth daily. ) 28 tablet 11  . metoprolol succinate (TOPROL-XL) 50 MG 24 hr tablet Take 1 tablet (50 mg total) by mouth daily. Take with or immediately following a meal. 90 tablet 3  . ondansetron (ZOFRAN) 4 MG tablet Take 1 tablet (4 mg total) by mouth every 8 (eight) hours as needed for nausea or vomiting. 30 tablet 1  . pregabalin (LYRICA) 25 MG capsule Take 1 capsule (25 mg total) by mouth 2 (two) times daily. 60 capsule 2  . sertraline (ZOLOFT) 50 MG tablet TAKE 1 TABLET BY MOUTH ONCE DAILY. (Patient taking differently: Take 50 mg by mouth daily. ) 28 tablet 4  . ULTICARE MINI PEN NEEDLES 31G X 6 MM MISC USE AS DIRECTED 100 each 0  . [name] 500 MG chewable tablet Chew 500 mg by mouth See admin instructions. Take 500 mg with each snack and meal    . zolpidem (AMBIEN) 10 MG tablet Take 10 mg by mouth at bedtime as needed for sleep.      No facility-administered medications prior to visit.    Allergies  Allergen Reactions  . Ace Inhibitors Anaphylaxis and Swelling  . Penicillins Itching, Swelling and Other (See Comments)    Did it involve swelling of the face/tongue/throat, SOB, or low BP? Unknown Did it involve sudden or severe rash/hives, skin peeling, or any reaction on the inside of your mouth or nose? Unknown Did you need to seek medical attention at a hospital or doctor's office? Unknown When did it last happen?years  If all above answers are "NO", may proceed with cephalosporin use.   . Statins Other (See Comments)    elevated LFT's    . Albuterol Swelling    ROS Review of Systems  Constitutional:  Positive for fatigue.       Pt with COVID  Respiratory: Negative.   Cardiovascular: Negative.   Genitourinary:       Dialysis  Musculoskeletal: Positive for back pain and gait problem.  Objective:    Physical Exam  Constitutional: She is oriented to person, place, and time. She appears well-developed and well-nourished.  Eyes: Conjunctivae are normal.  Cardiovascular: Normal rate, regular rhythm, normal heart sounds and intact distal pulses.  Pulmonary/Chest: Effort normal and breath sounds normal.  Musculoskeletal:        General: Tenderness present.  Neurological: She is alert and oriented to person, place, and time.  Psychiatric: She has a normal mood and affect. Her behavior is normal.    BP 139/68 (BP Location: Right Arm, Patient Position: Sitting, Cuff Size: Large)   Pulse 90   Temp 97.8 F (36.6 C) (Temporal)   Wt 195 lb (88.5 kg)   SpO2 96%   BMI 33.47 kg/m  Wt Readings from Last 3 Encounters:  07/04/19 195 lb (88.5 kg)  05/15/19 195 lb (88.5 kg)  05/04/19 195 lb (88.5 kg)    Lab Results  Component Value Date   TSH 4.16 12/24/2016   Lab Results  Component Value Date   WBC 7.5 04/07/2019   HGB 11.0 (L) 04/07/2019   HCT 37.0 04/07/2019   MCV 76.3 (L) 04/07/2019   PLT 647 (H) 04/07/2019   Lab Results  Component Value Date   NA 140 04/07/2019   K 4.6 04/07/2019   CO2 24 04/07/2019   GLUCOSE 128 (H) 04/07/2019   BUN 74 (H) 04/07/2019   CREATININE 7.68 (H) 04/07/2019   BILITOT 0.9 04/07/2019   ALKPHOS 115 04/07/2019   AST 50 (H) 04/07/2019   ALT 36 04/07/2019   PROT 8.4 (H) 04/07/2019   ALBUMIN 3.4 (L) 04/07/2019   CALCIUM 9.3 04/07/2019   ANIONGAP 18 (H) 04/06/2019   Lab Results  Component Value Date   CHOL 199 03/08/2018   Lab Results  Component Value Date   HDL 37 (L) 03/08/2018   Lab Results  Component Value Date   LDLCALC 114 (H) 03/08/2018   Lab Results  Component Value Date   TRIG 242 (H) 03/08/2018   Lab Results   Component Value Date   CHOLHDL 5.4 03/08/2018   Lab Results  Component Value Date   HGBA1C 8.7 (H) 04/02/2019      Assessment & Plan:  1. Back pain with left-sided radiculopathy Refilled flexeril , max tylenol 500mg  6/day - Ambulatory referral to Orthopedic Surgery Worsening symptoms with radiation of the symptoms-burning from lower back into the left hip and lateral leg, no change in bowels, no acute injury-no NSAIDs due to dialysis-taking tylenol/flexeril and using topicals and heat Follow-up:  Ortho spine 45 min in discussion history, review of records-including lumbar MRI, physical , assessment and plan  Nance Mccombs Hannah Beat, MD

## 2019-07-04 NOTE — Patient Instructions (Addendum)
  Tylenol 500mg -max of 6/day-taken every 4-6 hours Refilled flexeril-take at night Ortho spine referral   If you have lab work done today you will be contacted with your lab results within the next 2 weeks.  If you have not heard from [place] then please contact us. The fastest way to get your results is to register for My Chart.   IF you received an x-ray today, you will receive an invoice from [facility]. Please contact [facility] at [phone] with questions or concerns regarding your invoice.   IF you received labwork today, you will receive an invoice from [facility]. Please contact [facility] at [phone] with questions or concerns regarding your invoice.   Our billing staff will not be able to assist you with questions regarding bills from these companies.  You will be contacted with the lab results as soon as they are available. The fastest way to get your results is to activate your My Chart account. Instructions are located on the last page of this paperwork. If you have not heard from [place] regarding the results in 2 weeks, please contact this office.

## 2019-07-11 ENCOUNTER — Other Ambulatory Visit: Payer: Self-pay | Admitting: Family Medicine

## 2019-07-12 ENCOUNTER — Other Ambulatory Visit: Payer: Self-pay | Admitting: Family Medicine

## 2019-07-12 MED ORDER — PREGABALIN 25 MG PO CAPS: 25.0000 mg | ORAL_CAPSULE | Freq: Two times a day (BID) | ORAL | 3 refills | Status: DC

## 2019-07-12 NOTE — Telephone Encounter (Signed)
Requesting refill

## 2019-07-13 ENCOUNTER — Encounter: Payer: Self-pay | Admitting: Orthopaedic Surgery

## 2019-07-13 ENCOUNTER — Ambulatory Visit (INDEPENDENT_AMBULATORY_CARE_PROVIDER_SITE_OTHER): Payer: 59 | Admitting: Orthopaedic Surgery

## 2019-07-13 ENCOUNTER — Other Ambulatory Visit: Payer: Self-pay

## 2019-07-13 VITALS — BP 173/93 | HR 107 | Ht 64.0 in | Wt 195.0 lb

## 2019-07-13 DIAGNOSIS — M5410 Radiculopathy, site unspecified: Secondary | ICD-10-CM

## 2019-07-13 NOTE — Progress Notes (Addendum)
Office Visit Note   Patient: Kathryn Beck           Date of Birth: 02-Apr-1955           MRN: [id number] Visit Date: 07/13/2019              Requested by: Kathryn Hancock, MD [address] [place],  [place] [address] PCP: Kathryn Helper, MD   Assessment & Plan: Visit Diagnoses: LBP with left L 3-4 facet arthroplasty  Plan: We discussed using Tylenol.  She does not need to take anti-inflammatories with her heart failure as well as renal failure and on dialysis.  She is on the transplant list currently.  She has a pool and when the weather warms up she can get in the pool to exercise work on weight loss which would also unload her back.  We reviewed images and discussed the facet arthropathy that is present.  She can return if she develops progressive radicular symptoms.  I discussed with her I do not think continues to narcotics intermittently is wise and she needs to wait so that when she does get surgery for transplant pain medication will be effective.  Follow-Up Instructions: No follow-ups on file.   Orders:  No orders of the defined types were placed in this encounter.  No orders of the defined types were placed in this encounter.     Procedures: No procedures performed   Clinical Data: No additional findings.   Subjective: Chief Complaint  Patient presents with  . Neck - Pain    HPI 64 year old female with chronic back pain and neck pain.  CT soft tissue done of her neck looking at facial swelling in her tongue showed some mild cervical spondylosis.  Previous MRI last year lumbar spine demonstrated some facet arthropathy at L3-4 on the left and also L5S1 congenital fusion.  No central foraminal stenosis was noted.  Patient had some hydrocodone intermittently usually small amounts 15 to 20tablets.  She is on renal dialysis and is waiting on kidney transplant and is followed at [facility] transplant center.  She has been told not to take anti-inflammatories due to her kidney  failure.  Patient seen by Kathryn Beck.   Review of Systems positive for Covid, chronic dialysis.  Back pain, chronic.  Chronic diastolic heart failure, depression.  Otherwise negative as pertains HPI.   Objective: Vital Signs: There were no vitals taken for this visit.  Physical Exam Constitutional:      Appearance: She is well-developed.  HENT:     Head: Normocephalic.     Right Ear: External ear normal.     Left Ear: External ear normal.  Eyes:     Pupils: Pupils are equal, round, and reactive to light.  Neck:     Thyroid: No thyromegaly.     Trachea: No tracheal deviation.  Cardiovascular:     Rate and Rhythm: Normal rate.  Pulmonary:     Effort: Pulmonary effort is normal.  Abdominal:     Palpations: Abdomen is soft.  Skin:    General: Skin is warm and dry.  Neurological:     Mental Status: She is alert and oriented to person, place, and time.  Psychiatric:        Behavior: Behavior normal.     Ortho Exam positive for dialysis site.  Negative logroll the hips knee and ankle jerk are 1+ and symmetrical negative straight leg raising 90 degrees.  Specialty Comments:  No specialty comments available.  Imaging: CLINICAL DATA:  Chronic low back pain with weakness and numbness and difficulty walking.  EXAM: MRI LUMBAR SPINE WITHOUT CONTRAST  TECHNIQUE: Multiplanar, multisequence MR imaging of the lumbar spine was performed. No intravenous contrast was administered.  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  Alignment:  Physiologic.  Vertebrae:  No fracture, evidence of discitis, or bone lesion.  Conus medullaris and cauda equina: Conus extends to the L1-2 level. Conus and cauda equina appear normal.  Paraspinal and other soft tissues: Stable 19 mm nodule in the left adrenal gland, unchanged since 2015, 19 mm otherwise negative.  Disc  levels:  T12-L1: Negative.  L1-2: Negative.  L2-3: Negative.  L3-4: Small broad-based disc bulge without neural impingement. Focal slight arthritic changes of the left facet joint. Widely patent neural foramina. No spinal stenosis.  L4-5: Small broad-based disc bulge with no neural impingement. No foraminal or spinal stenosis.  L5-S1: Vestigial disc. No neural impingement. 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  IMPRESSION: No significant abnormality of the lumbar spine. Focal slight facet arthritis at L3-4 to the left. 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   Electronically Signed   By: Kathryn Beck M.D.   On: 04/22/2018 10:13    PMFS History: Patient Active Problem List   Diagnosis Date Noted  . Pneumonia due to COVID-19 virus 04/02/2019  . Neck pain 03/23/2019  . Hypotension 12/05/2018  . Weakness 12/05/2018  . Near syncope 12/05/2018  . Ischemic heart disease 09/25/2018  . Renal disease 08/22/2018  . Not currently working due to disabled status 06/13/2018  . ESRD on dialysis ([place]) 06/12/2018  . Hypertension associated with diabetes ([place]) 06/12/2018  . Depression, major, single episode, severe ([place]) 03/09/2018  . Adrenal mass, left ([place]) 11/09/2017  . MGUS (monoclonal gammopathy of unknown significance) 11/05/2017  . Chronic diastolic CHF (congestive heart failure) ([place]) 09/20/2017  . Bilateral leg weakness 09/09/2017  . Adrenal adenoma 09/03/2017  . Type 2 diabetes mellitus with diabetic neuropathy, unspecified ([place]) 08/18/2017  . Unsteady gait 07/11/2017  . Recurrent falls 07/11/2017  . Anemia of chronic disease 03/24/2017  . History of colonic polyps 02/10/2017  . Dysphagia 11/05/2016  . IBS (irritable bowel syndrome) 02/04/2016  . Heat sensitivity 10/07/2014  . Cardiac murmur 01/17/2014  . Back pain with left-sided radiculopathy 09/18/2013  . Seasonal allergies 03/10/2013  . Lipoma of back 12/22/2012  . GERD (gastroesophageal reflux  disease) 08/22/2012  . Sleep apnea 06/02/2011  . CKD (chronic kidney disease) stage 4, GFR 15-29 ml/min (H[facility]) 01/13/2011  . Neuropathy 04/16/2010  . FATIGUE 07/24/2009  . LIVER FUNCTION TESTS, ABNORMAL, HX OF 05/08/2009  . LUPUS ERYTHEMATOSUS, DISCOID 06/05/2008  . Arm pain 05/30/2008  . Mixed hyperlipidemia 06/07/2007  . Morbid obesity ([place]) 06/07/2007  . Malignant hypertension 06/07/2007   Past Medical History:  Diagnosis Date  . Acid reflux   . Anemia   . Arthritis   . Axillary masses    Soft tissue - status post excision  . Back pain   . COVID-19 virus infection 04/06/2019  . Depression   . Diastolic heart failure ([place])   . End-stage renal disease ([place])    TTHSat  in [place]  . Essential hypertension   . History of blood transfusion   . History of claustrophobia   . History of pneumonia 2019  . Hypoxia 04/03/2019  . Ischemic heart disease    Abnormal Myoview  April 2018 - medical therapy  . Mixed hyperlipidemia   . Obesity   . Pancreatitis   . Peritoneal dialysis catheter in place [facility])   . Sleep apnea    Noncompliant with CPAP  . Type 2 diabetes mellitus (HCC)     Family History  Problem Relation Age of Onset  . Hypertension Father   . Hypercholesterolemia Father   . Arthritis Father   . Hypertension Sister   . Hypercholesterolemia Sister   . Breast cancer Sister   . Hypertension Sister   . Colon cancer Neg Hx   . Colon polyps Neg Hx     Past Surgical History:  Procedure Laterality Date  . ABDOMINAL HYSTERECTOMY    . AV FISTULA PLACEMENT Left 09/02/2017   Procedure: creation of left arm ARTERIOVENOUS (AV) FISTULA;  Surgeon: Serafina Mitchell, MD;  Location: [facility] OR;  Service: Vascular;  Laterality: Left;  . COLONOSCOPY  2008   Dr. Oneida Alar: normal   . COLONOSCOPY N/A 12/18/2016   Dr. Oneida Alar: multiple tubular adenomas, internal hemorrhoids. Surveillance in 3 years   . ESOPHAGEAL DILATION N/A 10/13/2015   Procedure: ESOPHAGEAL DILATION;  Surgeon: Rogene Houston, MD;  [facility];  Service: Endoscopy;  Laterality: N/A;  . ESOPHAGOGASTRODUODENOSCOPY N/A 10/13/2015   Dr. Laural Golden: chronic gastritis on path, no H.pylori. Empiric dilation   . ESOPHAGOGASTRODUODENOSCOPY N/A 12/18/2016   Dr. Oneida Alar: mild gastritis. BRAVO study revealed uncontrolled GERD. Dysphagia secondary to uncontrolled reflux  . FOOT SURGERY Bilateral    "nerve"    . LEFT HEART CATH AND CORONARY ANGIOGRAPHY N/A 12/29/2018   Procedure: LEFT HEART CATH AND CORONARY ANGIOGRAPHY;  Surgeon: Jettie Booze, MD;  Location: [facility];  Service: Cardiovascular;  Laterality: N/A;  . LUNG BIOPSY    . MASS EXCISION Right 01/09/2013   Procedure: EXCISION OF NEOPLASM OF RIGHT  AXILLA  AND EXCISION OF NEOPLASM OF LEFT AXILLA;  Surgeon: Jamesetta So, MD;  Location: [facility];  Service: General;  Laterality: Right;  procedure end @ 08:23  . MYRINGOTOMY WITH TUBE PLACEMENT Bilateral 04/28/2017   Procedure: BILATERAL MYRINGOTOMY WITH TUBE PLACEMENT;  Surgeon: Leta Baptist, MD;  Location: [facility];  Service: ENT;  Laterality: Bilateral;  . REVISION OF ARTERIOVENOUS GORETEX GRAFT Left 05/04/2018   Procedure: TRANSPOSITION OF CEPHALIC VEIN ARTERIOVENOUS FISTULA LEFT ARM;  Surgeon: Rosetta Posner, MD;  Location: [facility];  Service: Vascular;  Laterality: Left;  . SAVORY DILATION N/A 12/18/2016   Procedure: SAVORY DILATION;  Surgeon: Danie Binder, MD;  [facility];  Service: Endoscopy;  Laterality: N/A;   Social History   Occupational History  . Occupation: retired   Tobacco Use  . Smoking status: Never Smoker  . Smokeless tobacco: Never Used  Substance and Sexual Activity  . Alcohol use: No  . Drug use: No  . Sexual activity: Not Currently

## 2019-07-17 ENCOUNTER — Encounter: Payer: Self-pay | Admitting: Family Medicine

## 2019-07-17 ENCOUNTER — Encounter: Payer: Self-pay | Admitting: Internal Medicine

## 2019-07-17 ENCOUNTER — Ambulatory Visit (INDEPENDENT_AMBULATORY_CARE_PROVIDER_SITE_OTHER): Payer: 59 | Admitting: Family Medicine

## 2019-07-17 ENCOUNTER — Other Ambulatory Visit: Payer: Self-pay

## 2019-07-17 VITALS — BP 140/60 | HR 89 | Temp 97.8°F | Ht 64.0 in | Wt 217.0 lb

## 2019-07-17 DIAGNOSIS — I5032 Chronic diastolic (congestive) heart failure: Secondary | ICD-10-CM

## 2019-07-17 DIAGNOSIS — N186 End stage renal disease: Secondary | ICD-10-CM

## 2019-07-17 DIAGNOSIS — D3500 Benign neoplasm of unspecified adrenal gland: Secondary | ICD-10-CM

## 2019-07-17 DIAGNOSIS — Z992 Dependence on renal dialysis: Secondary | ICD-10-CM

## 2019-07-17 DIAGNOSIS — Z794 Long term (current) use of insulin: Secondary | ICD-10-CM

## 2019-07-17 DIAGNOSIS — E1159 Type 2 diabetes mellitus with other circulatory complications: Secondary | ICD-10-CM

## 2019-07-17 DIAGNOSIS — D638 Anemia in other chronic diseases classified elsewhere: Secondary | ICD-10-CM

## 2019-07-17 DIAGNOSIS — E1140 Type 2 diabetes mellitus with diabetic neuropathy, unspecified: Secondary | ICD-10-CM

## 2019-07-17 DIAGNOSIS — D472 Monoclonal gammopathy: Secondary | ICD-10-CM

## 2019-07-17 DIAGNOSIS — I10 Essential (primary) hypertension: Secondary | ICD-10-CM

## 2019-07-17 DIAGNOSIS — K219 Gastro-esophageal reflux disease without esophagitis: Secondary | ICD-10-CM

## 2019-07-17 DIAGNOSIS — I152 Hypertension secondary to endocrine disorders: Secondary | ICD-10-CM

## 2019-07-17 DIAGNOSIS — E782 Mixed hyperlipidemia: Secondary | ICD-10-CM

## 2019-07-17 LAB — CBC AND DIFFERENTIAL
HCT: 36 (ref 36–46)
Hemoglobin: 11.2 — AB (ref 12.0–16.0)
Neutrophils Absolute: 61
WBC: 5.2

## 2019-07-17 LAB — BASIC METABOLIC PANEL
BUN: 43 — AB (ref 4–21)
CO2: 26 — AB (ref 13–22)
Chloride: 98 — AB (ref 99–108)
Creatinine: 8.1 — AB (ref 0.5–1.1)
Glucose: 211
Potassium: 3.6 (ref 3.4–5.3)
Sodium: 139 (ref 137–147)

## 2019-07-17 LAB — CBC: RBC: 4.34 (ref 3.87–5.11)

## 2019-07-17 LAB — HEPATITIS B SURFACE ANTIGEN: Hepatitis B Surface Ag: NEGATIVE

## 2019-07-17 LAB — IRON,TIBC AND FERRITIN PANEL
Iron: 93
TIBC: 215
UIBC: 122

## 2019-07-17 LAB — COMPREHENSIVE METABOLIC PANEL
Albumin: 4.1 (ref 3.5–5.0)
Calcium: 9.2 (ref 8.7–10.7)
Globulin: 2.3

## 2019-07-17 LAB — HEPATIC FUNCTION PANEL: ALT: 21 (ref 7–35)

## 2019-07-17 LAB — HEMOGLOBIN A1C: Hemoglobin A1C: 7.8

## 2019-07-17 NOTE — Progress Notes (Signed)
Most recent HbA1c 7.8%.  Patient is cleared for transplant surgery from diabetes point of view.   We will continue to work with her towards better control of diabetes.

## 2019-07-17 NOTE — Progress Notes (Addendum)
Established Patient Office Visit  Subjective:  Patient ID: Kathryn Beck, female    DOB: 1955/07/22  Age: 64 y.o. MRN: [id number]  CC:  Pre-operative evaluation-clearance for transplant surgery Question for pap smear-none x 10 years  HPI Kathryn Beck presents for pre -operative exam-pt states she needs a pelvic exam-total hysterectomy 30 years ago-for bleeding and irritation, no cancer. Pt G3P2 with  D and C -no additional GYN procedures.   Pt had a pap smear 10 years ago but due to new recommendation-no further pap smear needed post hysterectomy for non cancer.patients who have undergone total hysterectomy and have no history of cervical cancer or CIN -recommendation to  not undergo screening for cervical cancer or screening for vaginal cancer.   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  Reviewed previous imaging reports: 05/28/2017: MRI abd: Bilateral adrenal nodules with signal dropout on out of phase imaging, consistent with adenomas.              Example at 2.5 cm on the left and on the order of 1.6 cm on the right.  10/10/2015: CT abdomen and pelvis without contrast: Stable 2.2 cm left adrenal nodule with Hounsfield unit measurements 28 unchanged likely a lipid poor adenoma  08/23/2008: MRI of the abdomen with and without contrast: Left adrenal mass measuring 1.6 x 1.5 cm (previously measuring 1.4 x 1.6 cm on the CT scan from 2003 -stable in size),  Of note, she has an enlarging pancreatic head mass - pt reports a  h/o pancreatitis. (This will need to be eval. By GI)  Reviewed previous adrenal biochemical investigation:  Dexamethasone suppression test showed a low normal, nonsuppressed cortisol, but I suspect that this is due to her end-stage renal disease and a deficiency in cortisol clearance, rather than decreased production. Component     Latest Ref Rng & Units 08/31/2018  Cortisol - AM     mcg/dL 6.1  Dexamethasone, Serum     ng/dL 446   A 24-hour urine cortisol could not be done as she is on dialysis  Previous investigation was negative for hyperaldosteronism or pheochromocytoma: Component     Latest Ref Rng & Units 10/04/2018          Epinephrine     pg/mL 24  Norepinephrine     pg/mL 167 (L)  Dopamine     pg/mL <10  Total Catecholamines     pg/mL 191 (L)  Metanephrine, Pl     <=57 pg/mL <25  Normetanephrine, Pl     <=148 pg/mL 38  Total Metanephrines-Plasma     <=205 pg/mL 38  ALDOSTERONE      ng/dL 2  Renin Activity     0.25 - 5.82 ng/mL/h 1.48  ALDO / PRA Ratio     0.9 - 28.9 Ratio 1.4  Potassium     3.5 - 5.1 mEq/L 4.9   Previously: Component     Latest Ref Rng & Units 11/30/2017  Epinephrine     pg/mL  undetectable  Norepinephrine     pg/mL 99  Dopamine     pg/mL  undetectable  Catecholamines, Total     pg/mL 99  Metanephrine, Pl     <=  57 pg/mL <25  Normetanephrine, Pl     <=148 pg/mL 71  Total Metanephrines-Plasma     <=205 pg/mL 71  ALDOSTERONE      ng/dL 3  Renin Activity     0.25 - 5.82 ng/mL/h 1.20  ALDO / PRA Ratio     0.9 - 28.9 Ratio 2.5  Potassium     3.5 - 5.1 mEq/L 4.0   DM2: Reviewed HbA1c levels: Recent Labs       Lab Results  Component Value Date   HGBA1C 8.7 (H) 04/02/2019   HGBA1C 7.9 (A) 01/31/2019   HGBA1C 6.8 (A) 08/31/2018     Pt was on a regimen of: - Humalog 75/25 20-26 units before each meal, 2-3X a day depending on the CBG before the meal  - Levemir 100 units at bedtime  Then on: - Tresiba 40 units  daily after dialysis - Humalog 20 units before a smaller meal - Humalog 32 units before a larger meal Please do not take Humalog if sugars before a meal are <60. Otherwise, take it whenever you eat.   She is currently on: - Tresiba 50 units daily -she takes it before dialysis despite advice to take it after dialysis - Toujeo 50-56 units after dialysis  - apparently this was added by another doctor (?  They cannot remember name) to be taken 3 times a week after dialysis - Humalog  24 units before a smaller meal 28 units before a larger meal Take Humalog EVERY TIME you eat, 15 min before the meal, if sugars are >60. - Humalog sliding scale: - 150-175: + 1 unit  - 176-200: + 2 units  - 201-225: + 3 units  - 226-250: + 4 units  - 251-275: + 5 units - 276-300: + 6 units  Pt checks her sugars 4 times a day per review of her log: - am: 68, 103-406, 530 >> 130, 144, 198-314 >> 57, 152-223, 241 - 2h after b'fast:  31, 95-373 >> n/c >> 143, 164, 384 - before lunch: 68, 125-323 >> 151-344, 450 >> 98-201, 334, 361 - 2h after lunch: n/c >> 178 >> 350 >> 100-224 - before dinner:  64-288, 340 >> n/c >> 218-367 >> 69, 106-194 - 2h after dinner: n/c >> 190-226 - bedtime: 167-242, 362 >> 90-207 >> n/c >> 209, 226 >> 124-201, 232 - nighttime: n/c  Lowest sugar was 31 >> 130 >> 57; it is unclear at which level she has hypoglycemia awareness. Highest sugar was HI >> 530 >> 499 >> 241  Glucometer: Accuchek  Pt's meals are: - Breakfast: eggs, bacon, oatmeal, grits - Lunch: salads - Dinner: meat + veggie or sandwich + salad or nabs - Snacks: nabs, fruit cups, jello  -+ ESRD, now on dialysis Recent Labs       Lab Results  Component Value Date   BUN 74 (H) 04/07/2019   BUN 45 (H) 04/06/2019   CREATININE 7.68 (H) 04/07/2019   CREATININE 5.66 (H) 04/06/2019    Off olmesartan. -+ HL; last set of lipids: Recent Labs       Lab Results  Component Value Date   CHOL 199 03/08/2018    HDL 37 (L) 03/08/2018   LDLCALC 114 (H) 03/08/2018   LDLDIRECT 107 05/19/2016   TRIG 242 (H) 03/08/2018   CHOLHDL 5.4 03/08/2018    Previously on Lipitor 40, now on Zetia. Statin allergy  ASSESSMENT: 1. DM2, insulin-dependent, uncontrolled, with complications - CKD stage 4 - PN  2. HL  3. B adrenal adenomas  PLAN:  1. Patient with longstanding, uncontrolled, type 2 diabetes, previously on a premixed insulin regimen and also long-acting insulin, now on basal-bolus insulin regimen.  Her sugars improved significantly after she started dialysis but they continue to be fluctuating.  In the past, they were between 2s and 500s, but at last visit sugars were higher possibly related to significant fluid retention and changing dialysis parameters.  We have moved her insulin after dialysis as she was before taking it prior to dialysis.  We did try to add a GLP-1 receptor agonist but she has a history of pancreatitis so I would not suggest this for now.  We increased her insulin at last visit but we discussed that after she gets rid of the fluid, they may need to be decreased.  Since last visit she was also switched from [place] to [place] per insurance preference.  At that time I sent a prescription for CGM to her pharmacy, but she was not able to get this. -At this visit we reviewed her latest HbA1c which was higher than before, at 8.7% last month. -Upon questioning, they tell me that patient is taking [place] in the morning before dialysis despite repeated advice to take it after dialysis!  I am not sure how much of this is dialyzed off.  However, approximately 2 weeks ago, Toujeo was added by another doctor (?)  3 times a week after dialysis.  Therefore, in the dialysis days, she is taking both insulins!  She only had 1 low at 57 since last visit.  However, she has many highs mostly before meals and it is possible that these were due to part of the [place] being dialyzed off before she  started to show. -At this visit I discussed at length with patient that her sister did she can only be on one long-acting insulin per day and ideally this is taken after dialysis.  We will also increase the dose of long-acting insulin since her sugars are higher than target before meals.  After meals, she is mostly at or close to target.  Therefore, we will keep the Humalog dose the same. - I suggested to:  Patient Instructions  Please use either Toujeo OR Tresiba.  Increase: - Tresiba/Toujeo 56 units in the evening  Continue - Humalog  24 units before a smaller meal 28 units before a larger meal Take Humalog EVERY TIME you eat, 15 min before the meal, if sugars are >60. - Humalog sliding scale: - 150-175: + 1 unit  - 176-200: + 2 units  - 201-225: + 3 units  - 226-250: + 4 units  - 251-275: + 5 units - 276-300: + 6 units  Please return in 3 months with your sugar log.   - advised to check sugars at different times of the day - 3x a day, rotating check times - advised for yearly eye exams >> she is UTD - return to clinic in 3-4 months  2. HL -Reviewed latest lipid panel from 02/2018: LDL above goal, HDL low, triglycerides high Recent Labs       Lab Results  Component Value Date   CHOL 199 03/08/2018   HDL 37 (L) 03/08/2018   LDLCALC 114 (H) 03/08/2018   LDLDIRECT 107 05/19/2016   TRIG 242 (H) 03/08/2018   CHOLHDL 5.4 03/08/2018    -Continue Zetia without side effects -We will check this at last visit along with her adrenal labs  3.  Bilateral  adrenal adenoma -Patient with history of bilateral adrenal adenomas per review of her abdominal MRI from 2019.  The imaging characteristics pointed towards benign tumors, of 2.5 and 1.6 cm, respectively.  However, they have appeared to increase in size over time.  Reviewing the MRI of the abdomen from 2010 and the CT of the abdomen and pelvis from 2017, only one nodule was seen in both, in the left adrenal. -We checked  plasma catecholamines, metanephrines, and aldosterone and they were all normal in 11/2017 but her norepinephrine was slightly elevated, not specifically in 09/2018.  We will repeat this at next visit -Of note, her dexamethasone suppression test was abnormal but the cortisol was slightly high due to her end-stage renal disease and not actually increased cortisol production.  We cannot check a 24-hour urine cortisol as she is on dialysis.  We may need to late-night salivary cortisol checked at next visit if not checked by PCP-we will need records.  [name], MD PhD [facility] Endocrinology  Diastolic heat failure /Ischemic heart disease-cardio following-clearance for surgery-Imdur Hyperlipidemia-Zetia-previously on Lipitor- Pancreatitis-3 years ago-trigger DM Gout-allopurinol -as needed-pt does not use daily HTN-amlodipine/Catapres/Toprol/Lasix-pt takes bp at home-more stable with peritoneal dialysis  Depression-Zoloft Insomnia-Ambien-prn   History of Present Illness: 04/10/19-admission for [facility] f/u visit for admission from 1/10 to 04/07/2019 with a dx of covid 19 infection with the complication of pneumonia. treated witn 5 day course of remdesivir in hospital and also IV steroids. Pt states symptoms resolved-ongoing fatigue   ASSESSMENT & PLAN: Oncology  MGUS (monoclonal gammopathy of unknown significance) 1.  Monoclonal gammopathy of uncertain significance: - Patient with history of CKD stage IV, work-up for monoclonal gammopathy on 08/09/2017 showed an M spike of 0.1 g/dL of IgG kappa monoclonal protein.  Free kappa light chains were elevated at 171 and lambda light chains 75.7.  Ratio was 2.27.  24-hour urine showed nephrotic range proteinuria.  This was 18 g.  -Skeletal survey on 11/05/2017 was negative for lytic lesions -Most recent myeloma labs are from October 2019, with kappa light chain elevated at 175.5 mg/L, lambda light chain 75.4, and ratio of 2.33.  Protein electro  pheresis significant for M spike of 0.3 g/dL, IFE with faint band in the gamma region suspicious for monoclonal immunoglobulin. -Have recommended patient proceed with bone marrow biopsy with aspiration as this was not done at time of her initial diagnosis of MGUS.  Also, because patient is considering kidney transplant we will need to rule out multiple myeloma and help her achieve a remission so she can be a candidate for transplant. Further recommendation is to repeat skeletal survey.  Multiple myeloma labs are pending for today. -She will return to clinic 1 week after bone marrow biopsy.   2.  CKD: Thought to be secondary to long history of diabetes, hypertension, obesity related glomerulopathy. -She follows up with nephrology for her kidney disease.   3.  Microcytic anemia: - She did receive 2 units of PRBC during hospitalization in June.  She also received Feraheme at that time. -We give her him in our office on 11/29/2017.  She was started on weekly Procrit 20,000 units on 11/26/2017.  Hemoglobin traverse the end of September has improved and been staying. -She is requiring Procrit once every 2 weeks. Scheduled for blood work 4/30 and follow up appointment same day.  01/24/19-Cardiology Patient saw Dr. Domenic Polite for preoperative clearance before undergoing renal transplant.  She has a history of an abnormal Lexiscan Myoview 06/2016 showing anterior scar with  moderate peri-infarct ischemia overall considered intermediate risk study.  Cardiac cath was not pursued at that time because of renal disease and she was not on dialysis.  Cardiac cath 12/29/18 widely patent coronary arteries no AV stenosis.2 Decho 01/17/19 normal LVEF 60-65% mod LVH mod dilated LA, mild MR. Dr. Domenic Polite told her no further workup needed before undergoing renal transplant. Plan-Hypertension well controlled Chronic diastolic CHF 2D echo 24/40/1027 normal LVEF with impaired relaxation-on dialysis History of abnormal  myocardial perfusion study but cardiac cath 12/29/2018 showed widely patent coronary arteries-cleared for renal transplant list per Dr. Domenic Polite. History of heart murmur 2D echo 01/17/2027 only show mild aortic regurgitation, mild MR mild TR ESRD on peritoneal dialysis hoping for renal transplant at [facility].  Past Medical History:  Diagnosis Date  . Acid reflux   . Anemia   . Arthritis   . Axillary masses    Soft tissue - status post excision  . Back pain   . COVID-19 virus infection 04/06/2019  . Depression   . Diastolic heart failure ([place])   . End-stage renal disease ([place])    TTHSat  in [place]  . Essential hypertension   . History of blood transfusion   . History of claustrophobia   . History of pneumonia 2019  . Hypoxia 04/03/2019  . Ischemic heart disease    Abnormal Myoview April 2018 - medical therapy  . Mixed hyperlipidemia   . Obesity   . Pancreatitis   . Peritoneal dialysis catheter in place [facility])   . Sleep apnea    Noncompliant with CPAP  . Type 2 diabetes mellitus ([place])     Past Surgical History:  Procedure Laterality Date  . ABDOMINAL HYSTERECTOMY    . AV FISTULA PLACEMENT Left 09/02/2017   Procedure: creation of left arm ARTERIOVENOUS (AV) FISTULA;  Surgeon: Serafina Mitchell, MD;  Location: [facility] OR;  Service: Vascular;  Laterality: Left;  . COLONOSCOPY  2008   Dr. Oneida Alar: normal   . COLONOSCOPY N/A 12/18/2016   Dr. Oneida Alar: multiple tubular adenomas, internal hemorrhoids. Surveillance in 3 years   . ESOPHAGEAL DILATION N/A 10/13/2015   Procedure: ESOPHAGEAL DILATION;  Surgeon: Rogene [place], MD;  Location: [facility] SU[facility];  Service: Endoscopy;  Laterality: N/A;  . ESOPHAGOGASTRODUODENOSCOPY N/A 10/13/2015   Dr. Laural Golden: chronic gastritis on path, no H.pylori. Empiric dilation   . ESOPHAGOGASTRODUODENOSCOPY N/A 12/18/2016   Dr. Oneida Alar: mild gastritis. BRAVO study revealed uncontrolled GERD. Dysphagia secondary to uncontrolled reflux  . FOOT SURGERY Bilateral     "nerve"    . LEFT HEART CATH AND CORONARY ANGIOGRAPHY N/A 12/29/2018   Procedure: LEFT HEART CATH AND CORONARY ANGIOGRAPHY;  Surgeon: Jettie Booze, MD;  Location: [place] CV LAB;  Service: Cardiovascular;  Laterality: N/A;  . LUNG BIOPSY    . MASS EXCISION Right 01/09/2013   Procedure: EXCISION OF NEOPLASM OF RIGHT  AXILLA  AND EXCISION OF NEOPLASM OF LEFT AXILLA;  Surgeon: Jamesetta So, MD;  Location: [facility] ORS;  Service: General;  Laterality: Right;  procedure end @ 08:23  . MYRINGOTOMY WITH TUBE PLACEMENT Bilateral 04/28/2017   Procedure: BILATERAL MYRINGOTOMY WITH TUBE PLACEMENT;  Surgeon: Leta Baptist, MD;  Location: [place];  Service: ENT;  Laterality: Bilateral;  . REVISION OF ARTERIOVENOUS GORETEX GRAFT Left 05/04/2018   Procedure: TRANSPOSITION OF CEPHALIC VEIN ARTERIOVENOUS FISTULA LEFT ARM;  Surgeon: Rosetta Posner, MD;  Location: [place];  Service: Vascular;  Laterality: Left;  . SAVORY DILATION N/A 12/18/2016   Procedure: SAVORY  DILATION;  Surgeon: Danie Binder, MD;  Location: [facility];  Service: Endoscopy;  Laterality: N/A;    Family History  Problem Relation Age of Onset  . Hypertension Father   . Hypercholesterolemia Father   . Arthritis Father   . Hypertension Sister   . Hypercholesterolemia Sister   . Breast cancer Sister   . Hypertension Sister   . Colon cancer Neg Hx   . Colon polyps Neg Hx     Social History   Socioeconomic History  . Marital status: Married    Spouse name: larry   . Number of children: 2  . Years of education: 81  . Highest education level: 12th grade  Occupational History  . Occupation: retired   Tobacco Use  . Smoking status: Never Smoker  . Smokeless tobacco: Never Used  Substance and Sexual Activity  . Alcohol use: No  . Drug use: No  . Sexual activity: Not Currently  Other Topics Concern  . Not on file  Social History Narrative   Lives alone with husband    Social Determinants of Health   Financial Resource Strain:    . Difficulty of Paying Living Expenses:   Food Insecurity:   . Worried About Charity fundraiser in the Last Year:   . Arboriculturist in the Last Year:   Transportation Needs:   . Film/video editor (Medical):   Marland Kitchen Lack of Transportation (Non-Medical):   Physical Activity:   . Days of Exercise per Week:   . Minutes of Exercise per Session:   Stress:   . Feeling of Stress :   Social Connections:   . Frequency of Communication with Friends and Family:   . Frequency of Social Gatherings with Friends and Family:   . Attends Religious Services:   . Active Member of Clubs or Organizations:   . Attends Archivist Meetings:   Marland Kitchen Marital Status:   Intimate Partner Violence:   . Fear of Current or Ex-Partner:   . Emotionally Abused:   Marland Kitchen Physically Abused:   . Sexually Abused:     Outpatient Medications Prior to Visit  Medication Sig Dispense Refill  . acetaminophen (TYLENOL) 500 MG tablet Take 1,000 mg by mouth every 8 (eight) hours as needed for mild pain or fever.     Marland Kitchen allopurinol (ZYLOPRIM) 100 MG tablet Take 100 mg by mouth daily.    Marland Kitchen amLODipine (NORVASC) 10 MG tablet TAKE 1 TABLET BY MOUTH ONCE DAILY. (Patient taking differently: Take 10 mg by mouth daily. ) 28 tablet 10  . cloNIDine (CATAPRES) 0.3 MG tablet TAKE (1/2) TABLET BY MOUTH TWICE DAILY. 28 tablet 11  . Continuous Blood Gluc Sensor (FREESTYLE LIBRE 14 DAY SENSOR) MISC 1 each by Does not apply route every 14 (fourteen) days. Change every 2 weeks 2 each 11  . cyclobenzaprine (FLEXERIL) 5 MG tablet TAKE ONE TABLET BY MOUTH AT BEDTIME AS NEEDED FOR MUSCLE SPASMS. 30 tablet 1  . dexlansoprazole (DEXILANT) 60 MG capsule TAKE 1 CAPSULE IN THE MORNING WITH FOOD 28 capsule 6  . diclofenac Sodium (VOLTAREN) 1 % GEL Apply 2 g topically daily as needed (for pain).     Marland Kitchen ezetimibe (ZETIA) 10 MG tablet TAKE 1 TABLET BY MOUTH ONCE DAILY. (Patient taking differently: Take 10 mg by mouth daily. ) 28 tablet 11  . furosemide  (LASIX) 80 MG tablet Take 80 mg by mouth 2 (two) times daily.    . GNP ASPIRIN LOW  DOSE 81 MG EC tablet TAKE 1 TABLET BY MOUTH ONCE DAILY. 30 tablet 11  . guaiFENesin (MUCINEX) 600 MG 12 hr tablet Take 1 tablet (600 mg total) by mouth 2 (two) times daily. 20 tablet 0  . Insulin Glargine, 2 Unit Dial, (TOUJEO MAX SOLOSTAR) 300 UNIT/ML SOPN Inject 50-56 Units into the skin See admin instructions. Take after dialysis 3 day a week. 3 pen 11  . Insulin Lispro (HUMALOG KWIKPEN) 200 UNIT/ML SOPN Inject 24-28 Units into the skin 3 (three) times daily before meals. 30 mL 5  . isosorbide mononitrate (IMDUR) 30 MG 24 hr tablet TAKE 1 TABLET BY MOUTH ONCE DAILY. (Patient taking differently: Take 30 mg by mouth daily. ) 28 tablet 11  . metoprolol succinate (TOPROL-XL) 50 MG 24 hr tablet Take 1 tablet (50 mg total) by mouth daily. Take with or immediately following a meal. 90 tablet 3  . ondansetron (ZOFRAN) 4 MG tablet Take 1 tablet (4 mg total) by mouth every 8 (eight) hours as needed for nausea or vomiting. 30 tablet 1  . pregabalin (LYRICA) 25 MG capsule Take 1 capsule (25 mg total) by mouth 2 (two) times daily. 56 capsule 3  . sertraline (ZOLOFT) 50 MG tablet TAKE 1 TABLET BY MOUTH ONCE DAILY. (Patient taking differently: Take 50 mg by mouth daily. ) 28 tablet 4  . ULTICARE MINI PEN NEEDLES 31G X 6 MM MISC USE AS DIRECTED 100 each 0  . [name] 500 MG chewable tablet Chew 500 mg by mouth See admin instructions. Take 500 mg with each snack and meal    . zolpidem (AMBIEN) 10 MG tablet Take 10 mg by mouth at bedtime as needed for sleep.      No facility-administered medications prior to visit.    Allergies  Allergen Reactions  . Ace Inhibitors Anaphylaxis and Swelling  . Penicillins Itching, Swelling and Other (See Comments)    Did it involve swelling of the face/tongue/throat, SOB, or low BP? Unknown Did it involve sudden or severe rash/hives, skin peeling, or any reaction on the inside of your mouth or  nose? Unknown Did you need to seek medical attention at a hospital or doctor's office? Unknown When did it last happen?years  If all above answers are "NO", may proceed with cephalosporin use.   . Statins Other (See Comments)    elevated LFT's    . Albuterol Swelling    ROS Review of Systems  HENT: Negative.   Eyes:       Reading glasses  Respiratory: Negative.   Cardiovascular:       CHF  Gastrointestinal:       GERD No blood in bowels, no constipation or diarrhea  Endocrine:       DM-type 2 -insulin  Genitourinary:       Dialysis   Musculoskeletal: Positive for arthralgias, back pain and gait problem.       Cane due to instablity  Skin: Negative.   Allergic/Immunologic: Negative.   Neurological: Positive for dizziness. Negative for seizures, light-headedness and headaches.       Dizziness with hypotension  Hematological:       Oncology following  Psychiatric/Behavioral: Negative.       Objective:    Physical Exam  Constitutional: She is oriented to person, place, and time. She appears well-developed and well-nourished.  HENT:  Head: Normocephalic and atraumatic.  Eyes: Conjunctivae are normal.  Cardiovascular: Normal rate, regular rhythm, normal heart sounds and intact distal pulses.  Pulmonary/Chest: Effort normal  and breath sounds normal.  Abdominal: Soft. Bowel sounds are normal.  Musculoskeletal:        General: Edema present. Normal range of motion.     Cervical back: Normal range of motion.     Comments: LE edema bilat  Neurological: She is alert and oriented to person, place, and time. She has normal reflexes.  Psychiatric: She has a normal mood and affect. Her behavior is normal.    BP 140/60 (BP Location: Right Arm, Patient Position: Sitting, Cuff Size: Large)   Pulse 89   Temp 97.8 F (36.6 C) (Temporal)   Ht 5' 4" (1.626 m)   Wt 217 lb (98.4 kg)   SpO2 96%   BMI 37.25 kg/m  Wt Readings from Last 3 Encounters:  07/17/19 217 lb (98.4  kg)  07/13/19 195 lb (88.5 kg)  07/04/19 195 lb (88.5 kg)     Health Maintenance Due  Topic Date Due  . COVID-19 Vaccine (1) Never done    Lab Results  Component Value Date   TSH 4.16 12/24/2016   Lab Results  Component Value Date   WBC 7.5 04/07/2019   HGB 11.0 (L) 04/07/2019   HCT 37.0 04/07/2019   MCV 76.3 (L) 04/07/2019   PLT 647 (H) 04/07/2019   Lab Results  Component Value Date   NA 140 04/07/2019   K 4.6 04/07/2019   CO2 24 04/07/2019   GLUCOSE 128 (H) 04/07/2019   BUN 74 (H) 04/07/2019   CREATININE 7.68 (H) 04/07/2019   BILITOT 0.9 04/07/2019   ALKPHOS 115 04/07/2019   AST 50 (H) 04/07/2019   ALT 36 04/07/2019   PROT 8.4 (H) 04/07/2019   ALBUMIN 3.4 (L) 04/07/2019   CALCIUM 9.3 04/07/2019   ANIONGAP 18 (H) 04/06/2019   Lab Results  Component Value Date   CHOL 199 03/08/2018   Lab Results  Component Value Date   HDL 37 (L) 03/08/2018   Lab Results  Component Value Date   LDLCALC 114 (H) 03/08/2018   Lab Results  Component Value Date   TRIG 242 (H) 03/08/2018   Lab Results  Component Value Date   CHOLHDL 5.4 03/08/2018   Lab Results  Component Value Date   HGBA1C 8.7 (H) 04/02/2019      Assessment & Plan:  1. Type 2 diabetes mellitus with diabetic neuropathy, with long-term current use of insulin (HCC) Endo following-A1c 7.8%-clearance for transplant  2. Mixed hyperlipidemia zetia-unable to tolerate statins  3. Hypertension associated with diabetes ([place]) Amlodipine/catapres/lasix-stable-cardio clearance for transplant-echo/cath 4. Adrenal adenoma, unspecified laterality Oncology following-have asked for clearance for transplant  5. Gastroesophageal reflux disease without esophagitis dexilant -stable  6. Chronic diastolic CHF (congestive heart failure) (HCC)  no CHF-stable on lasix-mild LE edema noted  7. MGUS (monoclonal gammopathy of unknown significance) Oncology following-awaiting clearance for transplant-pt with  appointment this week  8. Anemia of chronic disease Oncology following-awaiting clearance for transplant  9. ESRD on dialysis ([place]) nephro following-peritoneal dialysis  No indication for pap smear-total hysterectomy for non cancer-mammogram -no mammographic evidence of malignancy Follow-up: oncology for remaining clearance for transplant 82mnutes-contact endo for clearance-nephro with A1c 2 weeks ago, contact oncology for follow up recommendation, review of history and speciality notes, exam and assessment-clearance for transplant surgery  06/17/2019-d/w oncology-pt needs f/u testing /labwork for heme/onc clearance for transplant. Oncology/Heme will schedule  LHannah Beat MD

## 2019-07-17 NOTE — Patient Instructions (Addendum)
  Keep appointment with oncology for clearance for surgery     If you have lab work done today you will be contacted with your lab results within the next 2 weeks.  If you have not heard from [place] then please contact us. The fastest way to get your results is to register for My Chart.   IF you received an x-ray today, you will receive an invoice from [facility]. Please contact [facility] at [phone] with questions or concerns regarding your invoice.   IF you received labwork today, you will receive an invoice from [facility]. Please contact [facility] at [phone] with questions or concerns regarding your invoice.   Our billing staff will not be able to assist you with questions regarding bills from these companies.  You will be contacted with the lab results as soon as they are available. The fastest way to get your results is to activate your My Chart account. Instructions are located on the last page of this paperwork. If you have not heard from [place] regarding the results in 2 weeks, please contact this office.

## 2019-07-18 ENCOUNTER — Other Ambulatory Visit (HOSPITAL_COMMUNITY): Payer: Self-pay | Admitting: *Deleted

## 2019-07-18 ENCOUNTER — Encounter: Payer: Self-pay | Admitting: Family Medicine

## 2019-07-18 DIAGNOSIS — D472 Monoclonal gammopathy: Secondary | ICD-10-CM

## 2019-07-19 ENCOUNTER — Other Ambulatory Visit (HOSPITAL_COMMUNITY): Payer: Self-pay | Admitting: Nurse Practitioner

## 2019-07-19 ENCOUNTER — Other Ambulatory Visit (HOSPITAL_COMMUNITY): Payer: Self-pay | Admitting: *Deleted

## 2019-07-19 ENCOUNTER — Encounter: Payer: Self-pay | Admitting: Orthopedic Surgery

## 2019-07-19 ENCOUNTER — Ambulatory Visit (INDEPENDENT_AMBULATORY_CARE_PROVIDER_SITE_OTHER): Payer: 59 | Admitting: Orthopedic Surgery

## 2019-07-19 ENCOUNTER — Inpatient Hospital Stay (HOSPITAL_COMMUNITY): Payer: 59

## 2019-07-19 ENCOUNTER — Telehealth (HOSPITAL_COMMUNITY): Payer: Self-pay | Admitting: *Deleted

## 2019-07-19 ENCOUNTER — Other Ambulatory Visit: Payer: Self-pay

## 2019-07-19 ENCOUNTER — Encounter (HOSPITAL_COMMUNITY): Payer: Self-pay

## 2019-07-19 ENCOUNTER — Ambulatory Visit: Payer: 59

## 2019-07-19 ENCOUNTER — Inpatient Hospital Stay (HOSPITAL_COMMUNITY): Payer: 59 | Attending: Hematology

## 2019-07-19 VITALS — BP 178/74 | HR 115 | Ht 64.0 in | Wt 213.0 lb

## 2019-07-19 DIAGNOSIS — D472 Monoclonal gammopathy: Secondary | ICD-10-CM | POA: Insufficient documentation

## 2019-07-19 DIAGNOSIS — M545 Low back pain, unspecified: Secondary | ICD-10-CM

## 2019-07-19 DIAGNOSIS — E876 Hypokalemia: Secondary | ICD-10-CM

## 2019-07-19 DIAGNOSIS — M5410 Radiculopathy, site unspecified: Secondary | ICD-10-CM | POA: Diagnosis not present

## 2019-07-19 DIAGNOSIS — D638 Anemia in other chronic diseases classified elsewhere: Secondary | ICD-10-CM

## 2019-07-19 DIAGNOSIS — N184 Chronic kidney disease, stage 4 (severe): Secondary | ICD-10-CM

## 2019-07-19 LAB — COMPREHENSIVE METABOLIC PANEL
ALT: 27 U/L (ref 0–44)
AST: 19 U/L (ref 15–41)
Albumin: 3.5 g/dL (ref 3.5–5.0)
Alkaline Phosphatase: 129 U/L — ABNORMAL HIGH (ref 38–126)
Anion gap: 18 — ABNORMAL HIGH (ref 5–15)
BUN: 35 mg/dL — ABNORMAL HIGH (ref 8–23)
CO2: 25 mmol/L (ref 22–32)
Calcium: 8.8 mg/dL — ABNORMAL LOW (ref 8.9–10.3)
Chloride: 96 mmol/L — ABNORMAL LOW (ref 98–111)
Creatinine, Ser: 5.96 mg/dL — ABNORMAL HIGH (ref 0.44–1.00)
GFR calc Af Amer: 8 mL/min — ABNORMAL LOW (ref >60)
GFR calc non Af Amer: 7 mL/min — ABNORMAL LOW (ref >60)
Glucose, Bld: 166 mg/dL — ABNORMAL HIGH (ref 70–99)
Potassium: 2.5 mmol/L — CL (ref 3.5–5.1)
Sodium: 139 mmol/L (ref 135–145)
Total Bilirubin: 0.5 mg/dL (ref 0.3–1.2)
Total Protein: 7 g/dL (ref 6.5–8.1)

## 2019-07-19 LAB — CBC WITH DIFFERENTIAL/PLATELET
Abs Immature Granulocytes: 0.05 10*3/uL (ref 0.00–0.07)
Basophils Absolute: 0 10*3/uL (ref 0.0–0.1)
Basophils Relative: 0 %
Eosinophils Absolute: 0.1 10*3/uL (ref 0.0–0.5)
Eosinophils Relative: 1 %
HCT: 36.7 % (ref 36.0–46.0)
Hemoglobin: 11.2 g/dL — ABNORMAL LOW (ref 12.0–15.0)
Immature Granulocytes: 1 %
Lymphocytes Relative: 30 %
Lymphs Abs: 2.1 10*3/uL (ref 0.7–4.0)
MCH: 25.1 pg — ABNORMAL LOW (ref 26.0–34.0)
MCHC: 30.5 g/dL (ref 30.0–36.0)
MCV: 82.3 fL (ref 80.0–100.0)
Monocytes Absolute: 0.8 10*3/uL (ref 0.1–1.0)
Monocytes Relative: 12 %
Neutro Abs: 3.9 10*3/uL (ref 1.7–7.7)
Neutrophils Relative %: 56 %
Platelets: 334 10*3/uL (ref 150–400)
RBC: 4.46 MIL/uL (ref 3.87–5.11)
RDW: 15.8 % — ABNORMAL HIGH (ref 11.5–15.5)
WBC: 6.9 10*3/uL (ref 4.0–10.5)
nRBC: 0.9 % — ABNORMAL HIGH (ref 0.0–0.2)

## 2019-07-19 LAB — LACTATE DEHYDROGENASE: LDH: 308 U/L — ABNORMAL HIGH (ref 98–192)

## 2019-07-19 MED ORDER — PREGABALIN 50 MG PO CAPS: 50.0000 mg | ORAL_CAPSULE | Freq: Three times a day (TID) | ORAL | 2 refills | Status: DC

## 2019-07-19 MED ORDER — POTASSIUM CHLORIDE CRYS ER 20 MEQ PO TBCR
EXTENDED_RELEASE_TABLET | ORAL | Status: AC
  Filled 2019-07-19: qty 2

## 2019-07-19 MED ORDER — POTASSIUM CHLORIDE CRYS ER 20 MEQ PO TBCR
40.0000 meq | EXTENDED_RELEASE_TABLET | Freq: Once | ORAL | Status: AC
  Administered 2019-07-19: 40 meq via ORAL

## 2019-07-19 MED ORDER — SODIUM CHLORIDE 0.9 % IV SOLN: INTRAVENOUS | Status: AC

## 2019-07-19 MED ORDER — IBUPROFEN 800 MG PO TABS: 800.0000 mg | ORAL_TABLET | Freq: Three times a day (TID) | ORAL | 1 refills | Status: DC | PRN

## 2019-07-19 MED ORDER — POTASSIUM CHLORIDE 10 MEQ/100ML IV SOLN
INTRAVENOUS | Status: AC
  Filled 2019-07-19: qty 100

## 2019-07-19 MED ORDER — POTASSIUM CHLORIDE 10 MEQ/100ML IV SOLN
10.0000 meq | INTRAVENOUS | Status: AC
  Administered 2019-07-19 (×2): 10 meq via INTRAVENOUS

## 2019-07-19 NOTE — Telephone Encounter (Signed)
Dr. Benny Lennert called into clinic stating this pt needed to follow-up with this clinic for blood work and office visit. Pt is due to have a kidney transplant at [facility] and needed to be cleared by provider at this clinic. Pt has an appointment and lab work scheduled.

## 2019-07-19 NOTE — Progress Notes (Unsigned)
CRITICAL VALUE ALERT  Critical Value:  Potassium 2.5  Date & Time Notied:  06/23/19 @ 1300  Provider Notified: Delton Coombes  Orders Received/Actions taken:

## 2019-07-19 NOTE — Progress Notes (Signed)
Chief Complaint  Patient presents with  . Back Pain    left sided     64 year old female presents with a 1 month history of pain in the left side of her lower back rating down to her left foot.  She denies any injury.  She complains of pain stating and when she is getting up.  Her pain did not respond to Tylenol and hydrocodone  Is been on dialysis for about a year which has been converted to a home dialysis program which she does herself  She is diabetic as well.  Review of systems bowel and bladder function remain intact no malaise or weight loss  Past Medical History:  Diagnosis Date  . Acid reflux   . Anemia   . Arthritis   . Axillary masses    Soft tissue - status post excision  . Back pain   . COVID-19 virus infection 04/06/2019  . Depression   . Diastolic heart failure ([place])   . End-stage renal disease ([place])    TTHSat  in [place]  . Essential hypertension   . History of blood transfusion   . History of claustrophobia   . History of pneumonia 2019  . Hypoxia 04/03/2019  . Ischemic heart disease    Abnormal Myoview April 2018 - medical therapy  . Mixed hyperlipidemia   . Obesity   . Pancreatitis   . Peritoneal dialysis catheter in place [facility])   . Sleep apnea    Noncompliant with CPAP  . Type 2 diabetes mellitus (HCC)     BP (!) 178/74   Pulse (!) 115   Ht 5\' 4"  (1.626 m)   Wt 213 lb (96.6 kg)   BMI 36.56 kg/m    She is awake alert and oriented x3 her mood is pleasant her affect is flat  She is ambulating with a cane  Lumbar spine  She is tender in the middle of the back and the left side of the back  Lower extremities motor grade 5 proximal and distal  Right leg straight leg raises nonreactive left straight leg raise produces pain in the lower back and proximal thigh area  Reflexes are normal  Sensory exam normal  Pulse and perfusion normal  Encounter Diagnoses  Name Primary?  . Lumbar pain Yes  . Radicular leg pain     Meds ordered this  encounter  Medications  . pregabalin (LYRICA) 50 MG capsule    Sig: Take 1 capsule (50 mg total) by mouth 3 (three) times daily.    Dispense:  90 capsule    Refill:  2  . ibuprofen (ADVIL) 800 MG tablet    Sig: Take 1 tablet (800 mg total) by mouth every 8 (eight) hours as needed.    Dispense:  90 tablet    Refill:  1   I would rather avoid the steroids at this time increase the Lyrica for neurogenic pain and an anti-inflammatory and see how she does with a follow-up scheduled in 6 weeks

## 2019-07-20 ENCOUNTER — Inpatient Hospital Stay (HOSPITAL_COMMUNITY): Payer: 59

## 2019-07-20 ENCOUNTER — Other Ambulatory Visit (HOSPITAL_COMMUNITY): Payer: Self-pay | Admitting: Nurse Practitioner

## 2019-07-20 ENCOUNTER — Ambulatory Visit: Payer: 59 | Admitting: Family Medicine

## 2019-07-20 DIAGNOSIS — D472 Monoclonal gammopathy: Secondary | ICD-10-CM | POA: Diagnosis not present

## 2019-07-20 DIAGNOSIS — E876 Hypokalemia: Secondary | ICD-10-CM

## 2019-07-20 LAB — PROTEIN ELECTROPHORESIS, SERUM
A/G Ratio: 1.1 (ref 0.7–1.7)
Albumin ELP: 3.4 g/dL (ref 2.9–4.4)
Alpha-1-Globulin: 0.1 g/dL (ref 0.0–0.4)
Alpha-2-Globulin: 0.8 g/dL (ref 0.4–1.0)
Beta Globulin: 1.4 g/dL — ABNORMAL HIGH (ref 0.7–1.3)
Gamma Globulin: 0.8 g/dL (ref 0.4–1.8)
Globulin, Total: 3.1 g/dL (ref 2.2–3.9)
Total Protein ELP: 6.5 g/dL (ref 6.0–8.5)

## 2019-07-20 LAB — POTASSIUM: Potassium: 3 mmol/L — ABNORMAL LOW (ref 3.5–5.1)

## 2019-07-20 LAB — KAPPA/LAMBDA LIGHT CHAINS
Kappa free light chain: 161.8 mg/L — ABNORMAL HIGH (ref 3.3–19.4)
Kappa, lambda light chain ratio: 1.79 — ABNORMAL HIGH (ref 0.26–1.65)
Lambda free light chains: 90.6 mg/L — ABNORMAL HIGH (ref 5.7–26.3)

## 2019-07-20 LAB — IMMUNOFIXATION ELECTROPHORESIS
IgA: 376 mg/dL — ABNORMAL HIGH (ref 87–352)
IgG (Immunoglobin G), Serum: 937 mg/dL (ref 586–1602)
IgM (Immunoglobulin M), Srm: 87 mg/dL (ref 26–217)
Total Protein ELP: 6.8 g/dL (ref 6.0–8.5)

## 2019-07-20 MED ORDER — POTASSIUM CHLORIDE CRYS ER 20 MEQ PO TBCR: 20.0000 meq | EXTENDED_RELEASE_TABLET | Freq: Every day | ORAL | 1 refills | Status: DC

## 2019-07-20 NOTE — Care Plan (Signed)
Patient called multiple times with no answer.  It was left on her voicemail to pick up her prescription for potassium daily.

## 2019-07-25 ENCOUNTER — Other Ambulatory Visit (HOSPITAL_COMMUNITY): Payer: Self-pay

## 2019-07-25 ENCOUNTER — Inpatient Hospital Stay (HOSPITAL_COMMUNITY): Payer: 59 | Attending: Nurse Practitioner | Admitting: Nurse Practitioner

## 2019-07-25 ENCOUNTER — Inpatient Hospital Stay (HOSPITAL_COMMUNITY): Payer: 59

## 2019-07-25 ENCOUNTER — Other Ambulatory Visit: Payer: Self-pay

## 2019-07-25 VITALS — BP 152/58 | HR 71 | Temp 97.1°F | Resp 18 | Wt 220.5 lb

## 2019-07-25 DIAGNOSIS — I5032 Chronic diastolic (congestive) heart failure: Secondary | ICD-10-CM | POA: Diagnosis not present

## 2019-07-25 DIAGNOSIS — E876 Hypokalemia: Secondary | ICD-10-CM

## 2019-07-25 DIAGNOSIS — R42 Dizziness and giddiness: Secondary | ICD-10-CM | POA: Diagnosis not present

## 2019-07-25 DIAGNOSIS — N186 End stage renal disease: Secondary | ICD-10-CM | POA: Insufficient documentation

## 2019-07-25 DIAGNOSIS — R55 Syncope and collapse: Secondary | ICD-10-CM | POA: Diagnosis not present

## 2019-07-25 DIAGNOSIS — D638 Anemia in other chronic diseases classified elsewhere: Secondary | ICD-10-CM

## 2019-07-25 DIAGNOSIS — Z794 Long term (current) use of insulin: Secondary | ICD-10-CM | POA: Diagnosis not present

## 2019-07-25 DIAGNOSIS — D472 Monoclonal gammopathy: Secondary | ICD-10-CM

## 2019-07-25 DIAGNOSIS — D509 Iron deficiency anemia, unspecified: Secondary | ICD-10-CM | POA: Insufficient documentation

## 2019-07-25 DIAGNOSIS — E782 Mixed hyperlipidemia: Secondary | ICD-10-CM | POA: Diagnosis not present

## 2019-07-25 DIAGNOSIS — Z79899 Other long term (current) drug therapy: Secondary | ICD-10-CM | POA: Diagnosis not present

## 2019-07-25 DIAGNOSIS — E1122 Type 2 diabetes mellitus with diabetic chronic kidney disease: Secondary | ICD-10-CM | POA: Insufficient documentation

## 2019-07-25 DIAGNOSIS — I132 Hypertensive heart and chronic kidney disease with heart failure and with stage 5 chronic kidney disease, or end stage renal disease: Secondary | ICD-10-CM | POA: Diagnosis not present

## 2019-07-25 DIAGNOSIS — G4700 Insomnia, unspecified: Secondary | ICD-10-CM | POA: Insufficient documentation

## 2019-07-25 DIAGNOSIS — N184 Chronic kidney disease, stage 4 (severe): Secondary | ICD-10-CM

## 2019-07-25 DIAGNOSIS — E669 Obesity, unspecified: Secondary | ICD-10-CM | POA: Diagnosis not present

## 2019-07-25 DIAGNOSIS — K219 Gastro-esophageal reflux disease without esophagitis: Secondary | ICD-10-CM | POA: Diagnosis not present

## 2019-07-25 LAB — POTASSIUM: Potassium: 2.6 mmol/L — CL (ref 3.5–5.1)

## 2019-07-25 MED ORDER — POTASSIUM CHLORIDE CRYS ER 20 MEQ PO TBCR: 20.0000 meq | EXTENDED_RELEASE_TABLET | ORAL | Status: DC

## 2019-07-25 MED ORDER — SODIUM CHLORIDE 0.9 % IV SOLN: INTRAVENOUS | Status: DC

## 2019-07-25 MED ORDER — POTASSIUM CHLORIDE CRYS ER 20 MEQ PO TBCR
20.0000 meq | EXTENDED_RELEASE_TABLET | ORAL | Status: AC
  Administered 2019-07-25 (×2): 20 meq via ORAL
  Filled 2019-07-25 (×2): qty 1

## 2019-07-25 MED ORDER — POTASSIUM CHLORIDE CRYS ER 20 MEQ PO TBCR: 20.0000 meq | EXTENDED_RELEASE_TABLET | Freq: Every day | ORAL | 3 refills | Status: DC

## 2019-07-25 MED ORDER — POTASSIUM CHLORIDE 10 MEQ/100ML IV SOLN
INTRAVENOUS | Status: AC
  Filled 2019-07-25: qty 100

## 2019-07-25 MED ORDER — POTASSIUM CHLORIDE 10 MEQ/100ML IV SOLN
10.0000 meq | INTRAVENOUS | Status: AC
  Administered 2019-07-25 (×2): 10 meq via INTRAVENOUS
  Filled 2019-07-25: qty 100

## 2019-07-25 NOTE — Progress Notes (Signed)
Potassium given per orders. Patient tolerated it well without problems. Vitals stable and discharged home from clinic via wheelchair Follow up as scheduled.

## 2019-07-25 NOTE — Progress Notes (Signed)
Kathryn Beck, [place] [address]   CLINIC:  Medical Oncology/Hematology  PCP:  Fayrene Helper, MD [address] [place] [address] [phone]   REASON FOR VISIT: Follow-up for MGUS  CURRENT THERAPY: Observation   INTERVAL HISTORY:  Kathryn Beck 64 y.o. female returns for routine follow-up for MGUS.  Patient reports she is doing well since her last visit.  She denies any new bone pain.  She denies any bleeding per rectum or melena.  She denies any easy bruising or bleeding. Denies any nausea, vomiting, or diarrhea. Denies any new pains. Had not noticed any recent bleeding such as epistaxis, hematuria or hematochezia. Denies recent chest pain on exertion, shortness of breath on minimal exertion, pre-syncopal episodes, or palpitations. Denies any numbness or tingling in hands or feet. Denies any recent fevers, infections, or recent hospitalizations. Patient reports appetite at 75% and energy level at 25%.  She is eating well maintain her weight at this time.     REVIEW OF SYSTEMS:  Review of Systems  Neurological: Positive for dizziness and numbness.  Psychiatric/Behavioral: Positive for sleep disturbance.  All other systems reviewed and are negative.    PAST MEDICAL/SURGICAL HISTORY:  Past Medical History:  Diagnosis Date  . Acid reflux   . Anemia   . Arthritis   . Axillary masses    Soft tissue - status post excision  . Back pain   . COVID-19 virus infection 04/06/2019  . Depression   . Diastolic heart failure ([place])   . End-stage renal disease ([place])    TTHSat  in [place]  . Essential hypertension   . History of blood transfusion   . History of claustrophobia   . History of pneumonia 2019  . Hypoxia 04/03/2019  . Ischemic heart disease    Abnormal Myoview April 2018 - medical therapy  . Mixed hyperlipidemia   . Obesity   . Pancreatitis   . Peritoneal dialysis catheter in place [facility])   . Sleep apnea    Noncompliant with CPAP  . Type 2 diabetes mellitus ([place])    Past Surgical History:  Procedure Laterality Date  . ABDOMINAL HYSTERECTOMY    . AV FISTULA PLACEMENT Left 09/02/2017   Procedure: creation of left arm ARTERIOVENOUS (AV) FISTULA;  Surgeon: Serafina Mitchell, MD;  Location: [facility] OR;  Service: Vascular;  Laterality: Left;  . COLONOSCOPY  2008   Dr. Oneida Alar: normal   . COLONOSCOPY N/A 12/18/2016   Dr. Oneida Alar: multiple tubular adenomas, internal hemorrhoids. Surveillance in 3 years   . ESOPHAGEAL DILATION N/A 10/13/2015   Procedure: ESOPHAGEAL DILATION;  Surgeon: Rogene Houston, MD;  [facility];  Service: Endoscopy;  Laterality: N/A;  . ESOPHAGOGASTRODUODENOSCOPY N/A 10/13/2015   Dr. Laural Golden: chronic gastritis on path, no H.pylori. Empiric dilation   . ESOPHAGOGASTRODUODENOSCOPY N/A 12/18/2016   Dr. Oneida Alar: mild gastritis. BRAVO study revealed uncontrolled GERD. Dysphagia secondary to uncontrolled reflux  . FOOT SURGERY Bilateral    "nerve"    . LEFT HEART CATH AND CORONARY ANGIOGRAPHY N/A 12/29/2018   Procedure: LEFT HEART CATH AND CORONARY ANGIOGRAPHY;  Surgeon: Jettie Booze, MD;  Location: [facility] CV LAB;  Service: Cardiovascular;  Laterality: N/A;  . LUNG BIOPSY    . MASS EXCISION Right 01/09/2013   Procedure: EXCISION OF NEOPLASM OF RIGHT  AXILLA  AND EXCISION OF NEOPLASM OF LEFT AXILLA;  Surgeon: Jamesetta So, MD;  Location: [facility] ORS;  Service: General;  Laterality: Right;  procedure end @ 08:23  . MYRINGOTOMY WITH TUBE PLACEMENT Bilateral 04/28/2017   Procedure: BILATERAL MYRINGOTOMY WITH TUBE PLACEMENT;  Surgeon: Leta Baptist, MD;  Location: [facility];  Service: ENT;  Laterality: Bilateral;  . REVISION OF ARTERIOVENOUS GORETEX GRAFT Left 05/04/2018   Procedure: TRANSPOSITION OF CEPHALIC VEIN ARTERIOVENOUS FISTULA LEFT ARM;  Surgeon: Rosetta Posner, MD;  Location: [facility];  Service: Vascular;  Laterality: Left;  . SAVORY DILATION N/A 12/18/2016   Procedure: SAVORY DILATION;   Surgeon: Danie Binder, MD;  [facility];  Service: Endoscopy;  Laterality: N/A;     SOCIAL HISTORY:  Social History   Socioeconomic History  . Marital status: Married    Spouse name: larry   . Number of children: 2  . Years of education: 64  . Highest education level: 12th grade  Occupational History  . Occupation: retired   Tobacco Use  . Smoking status: Never Smoker  . Smokeless tobacco: Never Used  Substance and Sexual Activity  . Alcohol use: No  . Drug use: No  . Sexual activity: Not Currently  Other Topics Concern  . Not on file  Social History Narrative   Lives alone with husband    Social Determinants of Health   Financial Resource Strain:   . Difficulty of Paying Living Expenses:   Food Insecurity:   . Worried About Charity fundraiser in the Last Year:   . Arboriculturist in the Last Year:   Transportation Needs:   . Film/video editor (Medical):   Marland Kitchen Lack of Transportation (Non-Medical):   Physical Activity:   . Days of Exercise per Week:   . Minutes of Exercise per Session:   Stress:   . Feeling of Stress :   Social Connections:   . Frequency of Communication with Friends and Family:   . Frequency of Social Gatherings with Friends and Family:   . Attends Religious Services:   . Active Member of Clubs or Organizations:   . Attends Archivist Meetings:   Marland Kitchen Marital Status:   Intimate Partner Violence:   . Fear of Current or Ex-Partner:   . Emotionally Abused:   Marland Kitchen Physically Abused:   . Sexually Abused:     FAMILY HISTORY:  Family History  Problem Relation Age of Onset  . Hypertension Father   . Hypercholesterolemia Father   . Arthritis Father   . Hypertension Sister   . Hypercholesterolemia Sister   . Breast cancer Sister   . Hypertension Sister   . Colon cancer Neg Hx   . Colon polyps Neg Hx     CURRENT MEDICATIONS:  Outpatient Encounter Medications as of 07/25/2019  Medication Sig  . amLODipine (NORVASC) 10  MG tablet TAKE 1 TABLET BY MOUTH ONCE DAILY. (Patient taking differently: Take 10 mg by mouth daily. )  . calcitRIOL (ROCALTROL) 0.25 MCG capsule   . cloNIDine (CATAPRES) 0.3 MG tablet TAKE (1/2) TABLET BY MOUTH TWICE DAILY.  Marland Kitchen Continuous Blood Gluc Sensor (FREESTYLE LIBRE 14 DAY SENSOR) MISC 1 each by Does not apply route every 14 (fourteen) days. Change every 2 weeks  . dexlansoprazole (DEXILANT) 60 MG capsule TAKE 1 CAPSULE IN THE MORNING WITH FOOD  . ezetimibe (ZETIA) 10 MG tablet TAKE 1 TABLET BY MOUTH ONCE DAILY. (Patient taking differently: Take 10 mg by mouth daily. )  . furosemide (LASIX) 80 MG tablet Take 80 mg by mouth 2 (two) times daily.  . GNP ASPIRIN  LOW DOSE 81 MG EC tablet TAKE 1 TABLET BY MOUTH ONCE DAILY.  Marland Kitchen guaiFENesin (MUCINEX) 600 MG 12 hr tablet Take 1 tablet (600 mg total) by mouth 2 (two) times daily.  . Insulin Glargine, 2 Unit Dial, (TOUJEO MAX SOLOSTAR) 300 UNIT/ML SOPN Inject 50-56 Units into the skin See admin instructions. Take after dialysis 3 day a week.  . Insulin Lispro (HUMALOG KWIKPEN) 200 UNIT/ML SOPN Inject 24-28 Units into the skin 3 (three) times daily before meals.  . isosorbide mononitrate (IMDUR) 30 MG 24 hr tablet TAKE 1 TABLET BY MOUTH ONCE DAILY. (Patient taking differently: Take 30 mg by mouth daily. )  . metoprolol succinate (TOPROL-XL) 50 MG 24 hr tablet Take 1 tablet (50 mg total) by mouth daily. Take with or immediately following a meal.  . potassium chloride SA (KLOR-CON) 20 MEQ tablet Take 1 tablet (20 mEq total) by mouth daily.  . pregabalin (LYRICA) 50 MG capsule Take 1 capsule (50 mg total) by mouth 3 (three) times daily.  . sertraline (ZOLOFT) 50 MG tablet TAKE 1 TABLET BY MOUTH ONCE DAILY. (Patient taking differently: Take 50 mg by mouth daily. )  . ULTICARE MINI PEN NEEDLES 31G X 6 MM MISC USE AS DIRECTED  . [name] 500 MG chewable tablet Chew 500 mg by mouth See admin instructions. Take 500 mg with each snack and meal  . zolpidem  (AMBIEN) 10 MG tablet Take 10 mg by mouth at bedtime as needed for sleep.   Marland Kitchen acetaminophen (TYLENOL) 500 MG tablet Take 1,000 mg by mouth every 8 (eight) hours as needed for mild pain or fever.   Marland Kitchen allopurinol (ZYLOPRIM) 100 MG tablet Take 100 mg by mouth daily.  . cyclobenzaprine (FLEXERIL) 5 MG tablet TAKE ONE TABLET BY MOUTH AT BEDTIME AS NEEDED FOR MUSCLE SPASMS. (Patient not taking: Reported on 07/25/2019)  . diclofenac Sodium (VOLTAREN) 1 % GEL Apply 2 g topically daily as needed (for pain).   Marland Kitchen ibuprofen (ADVIL) 800 MG tablet Take 1 tablet (800 mg total) by mouth every 8 (eight) hours as needed. (Patient not taking: Reported on 07/25/2019)  . ondansetron (ZOFRAN) 4 MG tablet Take 1 tablet (4 mg total) by mouth every 8 (eight) hours as needed for nausea or vomiting. (Patient not taking: Reported on 07/25/2019)  . [DISCONTINUED] FLUoxetine (PROZAC) 10 MG capsule Take 10 mg by mouth daily.    . [DISCONTINUED] glipiZIDE (GLUCOTROL) 10 MG tablet Take 10 mg by mouth 2 (two) times daily before a meal.     Facility-Administered Encounter Medications as of 07/25/2019  Medication  . 0.9 %  sodium chloride infusion    ALLERGIES:  Allergies  Allergen Reactions  . Ace Inhibitors Anaphylaxis and Swelling  . Penicillins Itching, Swelling and Other (See Comments)    Did it involve swelling of the face/tongue/throat, SOB, or low BP? Unknown Did it involve sudden or severe rash/hives, skin peeling, or any reaction on the inside of your mouth or nose? Unknown Did you need to seek medical attention at a hospital or doctor's office? Unknown When did it last happen?years  If all above answers are "NO", may proceed with cephalosporin use.   . Statins Other (See Comments)    elevated LFT's    . Albuterol Swelling     PHYSICAL EXAM:  ECOG Performance status: 1  Vitals:   07/25/19 1018  BP: (!) 152/58  Pulse: 71  Resp: 18  Temp: (!) 97.1 F (36.2 C)  SpO2: 99%   Filed Weights  07/25/19  1018  Weight: 220 lb 8 oz (100 kg)    Physical Exam Constitutional:      Appearance: Normal appearance. She is normal weight.  Cardiovascular:     Rate and Rhythm: Normal rate and regular rhythm.     Heart sounds: Normal heart sounds.  Pulmonary:     Effort: Pulmonary effort is normal.     Breath sounds: Normal breath sounds.  Abdominal:     General: Bowel sounds are normal.     Palpations: Abdomen is soft.  Musculoskeletal:        General: Normal range of motion.  Skin:    General: Skin is warm.  Neurological:     Mental Status: She is alert and oriented to person, place, and time. Mental status is at baseline.  Psychiatric:        Mood and Affect: Mood normal.        Behavior: Behavior normal.        Thought Content: Thought content normal.        Judgment: Judgment normal.      LABORATORY DATA:  I have reviewed the labs as listed.  CBC    Component Value Date/Time   WBC 6.9 07/19/2019 1210   RBC 4.46 07/19/2019 1210   HGB 11.2 (L) 07/19/2019 1210   HCT 36.7 07/19/2019 1210   PLT 334 07/19/2019 1210   MCV 82.3 07/19/2019 1210   MCH 25.1 (L) 07/19/2019 1210   MCHC 30.5 07/19/2019 1210   RDW 15.8 (H) 07/19/2019 1210   LYMPHSABS 2.1 07/19/2019 1210   MONOABS 0.8 07/19/2019 1210   EOSABS 0.1 07/19/2019 1210   BASOSABS 0.0 07/19/2019 1210   CMP Latest Ref Rng & Units 07/25/2019 07/20/2019 07/19/2019  Glucose 70 - 99 mg/dL - - 166(H)  BUN 8 - 23 mg/dL - - 35(H)  Creatinine 0.44 - 1.00 mg/dL - - 5.96(H)  Sodium 135 - 145 mmol/L - - 139  Potassium 3.5 - 5.1 mmol/L 2.6(LL) 3.0(L) 2.5(LL)  Chloride 98 - 111 mmol/L - - 96(L)  CO2 22 - 32 mmol/L - - 25  Calcium 8.9 - 10.3 mg/dL - - 8.8(L)  Total Protein 6.5 - 8.1 g/dL - - 7.0  Total Bilirubin 0.3 - 1.2 mg/dL - - 0.5  Alkaline Phos 38 - 126 U/L - - 129(H)  AST 15 - 41 U/L - - 19  ALT 0 - 44 U/L - - 27    All questions were answered to patient's stated satisfaction. Encouraged patient to call with any new concerns or  questions before his next visit to the cancer center and we can certain see him sooner, if needed.     ASSESSMENT & PLAN:  MGUS (monoclonal gammopathy of unknown significance) 1.  Monoclonal gammopathy of uncertain significance: -Patient with a history of CKD stage IV, work-up for monoclonal gammopathy on 08/09/2017 shows an M spike of 0.1 g/dL of IgG kappa monoclonal protein.  Free kappa light chains were elevated at 171 and lambda light chains 75.7.  Ratio was 2.27.  24-hour urine showed nephrotic range proteinuria.  This was 18 g. -Skeletal survey on 11/05/2017 was negative for lytic lesions. -Most recent myeloma labs are from October 2019, with kappa light chains elevated at 175.5 g/L, lambda light chain 75.4, and ratio 2.33.  Protein electrophoresis significant for M spike of 0.3 g/dL, IFE with faint band in the gamma region suspicious for monoclonal immunoglobulin. -Skeletal survey done on 02/24/2019 showed no lytic lesions. -Bone marrow biopsy  done on 03/03/2019 showed slightly hypercellular marrow (50% with trilineage hematopoiesis) polytypic plasmacytosis. -Labs done on 07/19/2019 showed M spike negative.  Kappa light chains 161.8, lambda light chains 90.6, ratio 1.79.  Hemoglobin 11.2, platelets 334, WBC 6.9, creatinine 5.96, calcium 8.8. -She will follow-up in 3 months with repeat labs.  2.  CKD: -Thought to be secondary to a long history of diabetes, hypertension, obesity related glomerulopathy. -She is being followed by a transplant team at [facility]. -They have worked her up and are waiting the results of the bone marrow biopsy. -She will follow-up with the transplant team for a kidney transplant. -Labs done on 07/19/2019 showed creatinine 5.96  3.  Microcytic anemia: -She did receive 2 units of PRBC during hospitalization in June.  She also received Feraheme at that time. -She received Procrit injections in 2019.  However her hemoglobin improved to the point where she  no longer needed them. -Labs on 03/03/2019 showed hemoglobin 9.0 -Procrit injections were started back with her dialysis. -She received 8000 units with every treatment on Tuesday Thursday Saturday so that she gets 24,000 units weekly. -Labs done on 07/19/2019 showed hemoglobin 11.2 -We will recheck her iron levels at her next visit.  4.  Hypokalemia: -Labs done on 07/19/2019 showed potassium level 2.5.  She was brought in for IV and p.o. potassium at that time. -Labs rechecked on 07/20/2019 which showed potassium level came up to 3.0.  She was called in a prescription for potassium daily.  However she did not pick up her prescription. -Labs rechecked on 07/25/2019 which showed potassium was back down to 2.6.  Today she is given IV and p.o. potassium again.  She was told to pick up her prescription for potassium daily. -We will recheck labs     Orders placed this encounter:  Orders Placed This Encounter  Procedures  . Potassium  . Lactate dehydrogenase  . Protein electrophoresis, serum  . Kappa/lambda light chains  . CBC with Differential/Platelet  . Comprehensive metabolic panel  . Vitamin B12  . VITAMIN D 25 Hydroxy (Vit-D Deficiency, Fractures)      Francene Finders, FNP-C [place] [phone]

## 2019-07-25 NOTE — Patient Instructions (Signed)
[facility]  Discharge Instructions:   _______________________________________________________________  Thank you for choosing [facility] at [facility] to provide your oncology and hematology care.  To afford each patient quality time with our providers, please arrive at least 15 minutes before your scheduled appointment.  You need to re-schedule your appointment if you arrive 10 or more minutes late.  We strive to give you quality time with our providers, and arriving late affects you and other patients whose appointments are after yours.  Also, if you no show three or more times for appointments you may be dismissed from the clinic.  Again, thank you for choosing [facility] at [facility]. Our hope is that these requests will allow you access to exceptional care and in a timely manner. _______________________________________________________________  If you have questions after your visit, please contact our office at [phone] between the hours of 8:30 a.m. and 5:00 p.m. Voicemails left after 4:30 p.m. will not be returned until the following business day. _______________________________________________________________  For prescription refill requests, have your pharmacy contact our office. _______________________________________________________________  Recommendations made by the consultant and any test results will be sent to your referring physician. _______________________________________________________________ 

## 2019-07-25 NOTE — Assessment & Plan Note (Addendum)
1.  Monoclonal gammopathy of uncertain significance: -Patient with a history of CKD stage IV, work-up for monoclonal gammopathy on 08/09/2017 shows an M spike of 0.1 g/dL of IgG kappa monoclonal protein.  Free kappa light chains were elevated at 171 and lambda light chains 75.7.  Ratio was 2.27.  24-hour urine showed nephrotic range proteinuria.  This was 18 g. -Skeletal survey on 11/05/2017 was negative for lytic lesions. -Most recent myeloma labs are from October 2019, with kappa light chains elevated at 175.5 g/L, lambda light chain 75.4, and ratio 2.33.  Protein electrophoresis significant for M spike of 0.3 g/dL, IFE with faint band in the gamma region suspicious for monoclonal immunoglobulin. -Skeletal survey done on 02/24/2019 showed no lytic lesions. -Bone marrow biopsy done on 03/03/2019 showed slightly hypercellular marrow (50% with trilineage hematopoiesis) polytypic plasmacytosis. -Labs done on 07/19/2019 showed M spike negative.  Kappa light chains 161.8, lambda light chains 90.6, ratio 1.79.  Hemoglobin 11.2, platelets 334, WBC 6.9, creatinine 5.96, calcium 8.8. -She will follow-up in 3 months with repeat labs.  2.  CKD: -Thought to be secondary to a long history of diabetes, hypertension, obesity related glomerulopathy. -She is being followed by a transplant team at [facility]. -They have worked her up and are waiting the results of the bone marrow biopsy. -She will follow-up with the transplant team for a kidney transplant. -Labs done on 07/19/2019 showed creatinine 5.96  3.  Microcytic anemia: -She did receive 2 units of PRBC during hospitalization in June.  She also received Feraheme at that time. -She received Procrit injections in 2019.  However her hemoglobin improved to the point where she no longer needed them. -Labs on 03/03/2019 showed hemoglobin 9.0 -Procrit injections were started back with her dialysis. -She received 8000 units with every treatment on Tuesday  Thursday Saturday so that she gets 24,000 units weekly. -Labs done on 07/19/2019 showed hemoglobin 11.2 -We will recheck her iron levels at her next visit.  4.  Hypokalemia: -Labs done on 07/19/2019 showed potassium level 2.5.  She was brought in for IV and p.o. potassium at that time. -Labs rechecked on 07/20/2019 which showed potassium level came up to 3.0.  She was called in a prescription for potassium daily.  However she did not pick up her prescription. -Labs rechecked on 07/25/2019 which showed potassium was back down to 2.6.  Today she is given IV and p.o. potassium again.  She was told to pick up her prescription for potassium daily. -We will recheck labs

## 2019-07-25 NOTE — Progress Notes (Signed)
CRITICAL VALUE ALERT  Critical Value:  K+ 2.6  Date & Time Notied:  07/25/2019 at 1100  Provider Notified: Cristela Felt, NP  Orders Received/Actions taken: give K-Dur 20 mEq po q 1h x 2 doses and give KCl 20 mEq IV; pt also reminded to pick up K-dur Rx from her pharmacy that was called in last week and begin taking today.

## 2019-07-27 ENCOUNTER — Inpatient Hospital Stay (HOSPITAL_COMMUNITY): Payer: 59

## 2019-07-27 ENCOUNTER — Other Ambulatory Visit: Payer: Self-pay

## 2019-07-27 DIAGNOSIS — D472 Monoclonal gammopathy: Secondary | ICD-10-CM | POA: Diagnosis not present

## 2019-07-27 DIAGNOSIS — E876 Hypokalemia: Secondary | ICD-10-CM

## 2019-07-27 LAB — POTASSIUM: Potassium: 3.6 mmol/L (ref 3.5–5.1)

## 2019-07-28 ENCOUNTER — Inpatient Hospital Stay (HOSPITAL_COMMUNITY): Payer: 59

## 2019-08-01 ENCOUNTER — Other Ambulatory Visit: Payer: Self-pay | Admitting: Family Medicine

## 2019-08-02 ENCOUNTER — Other Ambulatory Visit (HOSPITAL_COMMUNITY)
Admission: RE | Admit: 2019-08-02 | Discharge: 2019-08-02 | Disposition: A | Discharge status: Home / Self Care | Payer: 59 | Source: Other Acute Inpatient Hospital | Attending: Family Medicine | Admitting: Family Medicine

## 2019-08-02 ENCOUNTER — Ambulatory Visit: Payer: 59 | Admitting: *Deleted

## 2019-08-02 ENCOUNTER — Other Ambulatory Visit: Payer: Self-pay

## 2019-08-02 DIAGNOSIS — R52 Pain, unspecified: Secondary | ICD-10-CM

## 2019-08-02 NOTE — Progress Notes (Signed)
Pt left urine for uds

## 2019-08-07 LAB — MISC LABCORP TEST (SEND OUT): Labcorp test code: 738526

## 2019-08-09 ENCOUNTER — Other Ambulatory Visit: Payer: Self-pay

## 2019-08-09 ENCOUNTER — Encounter: Payer: Self-pay | Admitting: Family Medicine

## 2019-08-09 ENCOUNTER — Telehealth (INDEPENDENT_AMBULATORY_CARE_PROVIDER_SITE_OTHER): Payer: 59 | Admitting: Family Medicine

## 2019-08-09 VITALS — BP 152/58 | Ht 64.0 in | Wt 220.0 lb

## 2019-08-09 DIAGNOSIS — R05 Cough: Secondary | ICD-10-CM | POA: Diagnosis not present

## 2019-08-09 DIAGNOSIS — J0140 Acute pansinusitis, unspecified: Secondary | ICD-10-CM | POA: Diagnosis not present

## 2019-08-09 DIAGNOSIS — R059 Cough, unspecified: Secondary | ICD-10-CM | POA: Insufficient documentation

## 2019-08-09 MED ORDER — PROMETHAZINE-DM 6.25-15 MG/5ML PO SYRP: 2.5000 mL | ORAL_SOLUTION | Freq: Two times a day (BID) | ORAL | 0 refills | Status: DC | PRN

## 2019-08-09 MED ORDER — AZITHROMYCIN 250 MG PO TABS: ORAL_TABLET | ORAL | 0 refills | Status: DC

## 2019-08-09 NOTE — Patient Instructions (Signed)
I appreciate the opportunity to provide you with care for your health and wellness. Today we discussed: sinus infection   Follow up: 3 months  No labs or referrals today  Please take the prescribed medication as directed and complete the full dose to prevent reinfection.  Please eat yogurt or take a probiotic if possible to avoid a yeast infection (this is common) from antibiotic. Should you develop a infection call the office or send a message in [place] to let us know. We will address it quickly.  As suggested for symptom management use Nasal saline spray to help ease congestion and dry nasal passages. Can be used throughout the day as needed. Avoid forceful blowing of nose. It will be common to have some blood if blowing nose a lot or if nose is very dry. Can you a humidifier at home as well. Remember to wash and air it out regularly. Tylenol (325 mg 2 tablets every 6 hours) and or Ibuprofen (200- 400 mg every 8 hours) for fever, sore throat and body aches. Can consider use of a Neti-pot to wash out sinus cavity (twice daily). Please be aware that you need to use distilled or boiled water for these. If congestion is increasing and or coughing can use Mucinex (twice daily) for cough and congestion with full glass of water.  If you have a sore throat warm fluids like hot tea or lemon water can help sooth this.   Please hydrate, rest, get fresh air daily and wash your hands well.  I hope you feel better soon.  Please continue to practice social distancing to keep you, your family, and our community safe.  If you must go out, please wear a mask and practice good handwashing.  It was a pleasure to see you and I look forward to continuing to work together on your health and well-being. Please do not hesitate to call the office if you need care or have questions about your care.  Have a wonderful day and week. With Gratitude, Cherly Beach, DNP, AGNP-BC

## 2019-08-09 NOTE — Assessment & Plan Note (Signed)
S&S consistent with Sinus infection. Antibiotics and Cough syrup ordered.

## 2019-08-09 NOTE — Assessment & Plan Note (Signed)
Cough syrup ordered Increased fluids encouraged

## 2019-08-09 NOTE — Progress Notes (Signed)
Virtual Visit via Telephone Note   This visit type was conducted due to national recommendations for restrictions regarding the COVID-19 Pandemic (e.g. social distancing) in an effort to limit this patient's exposure and mitigate transmission in our community.  Due to her co-morbid illnesses, this patient is at least at moderate risk for complications without adequate follow up.  This format is felt to be most appropriate for this patient at this time.  The patient did not have access to video technology/had technical difficulties with video requiring transitioning to audio format only (telephone).  All issues noted in this document were discussed and addressed.  No physical exam could be performed with this format.    Evaluation Performed:  Follow-up visit  Date:  08/09/2019   ID:  Kathryn Beck, DOB 14-Jan-1956, MRN [id number]  Patient Location: Home Provider Location: Office  Location of Patient: Home Location of Provider: Telehealth Consent was obtain for visit to be over via telehealth. I verified that I am speaking with the correct person using two identifiers.  PCP:  Fayrene Helper, MD   Chief Complaint:  Sinus issues   History of Present Illness:    Kathryn Beck is a 64 y.o. female with 2 weeks of bad cough, congestion, runny nose,  Sore throat, now ear pain bilaterally. Has tried tylenol, mucinex, robotussin and nyquil without relief. Just keeps getting worse.  Denies recent sick contacts or contacts with COVID. Has hx of PNA and COVID.  The patient does not have symptoms concerning for COVID-19 infection (fever, chills, cough, or new shortness of breath).   Past Medical, Surgical, Social History, Allergies, and Medications have been Reviewed.  Past Medical History:  Diagnosis Date  . Acid reflux   . Anemia   . Arthritis   . Axillary masses    Soft tissue - status post excision  . Back pain   . COVID-19 virus infection 04/06/2019  . Depression   .  Diastolic heart failure ([place])   . End-stage renal disease ([place])    TTHSat  in [place]  . Essential hypertension   . History of blood transfusion   . History of claustrophobia   . History of pneumonia 2019  . Hypoxia 04/03/2019  . Ischemic heart disease    Abnormal Myoview April 2018 - medical therapy  . Mixed hyperlipidemia   . Obesity   . Pancreatitis   . Peritoneal dialysis catheter in place [facility])   . Sleep apnea    Noncompliant with CPAP  . Type 2 diabetes mellitus ([place])    Past Surgical History:  Procedure Laterality Date  . ABDOMINAL HYSTERECTOMY    . AV FISTULA PLACEMENT Left 09/02/2017   Procedure: creation of left arm ARTERIOVENOUS (AV) FISTULA;  Surgeon: Serafina Mitchell, MD;  Location: [facility] OR;  Service: Vascular;  Laterality: Left;  . COLONOSCOPY  2008   Dr. Oneida Alar: normal   . COLONOSCOPY N/A 12/18/2016   Dr. Oneida Alar: multiple tubular adenomas, internal hemorrhoids. Surveillance in 3 years   . ESOPHAGEAL DILATION N/A 10/13/2015   Procedure: ESOPHAGEAL DILATION;  Surgeon: Rogene Houston, MD;  [facility];  Service: Endoscopy;  Laterality: N/A;  . ESOPHAGOGASTRODUODENOSCOPY N/A 10/13/2015   Dr. Laural Golden: chronic gastritis on path, no H.pylori. Empiric dilation   . ESOPHAGOGASTRODUODENOSCOPY N/A 12/18/2016   Dr. Oneida Alar: mild gastritis. BRAVO study revealed uncontrolled GERD. Dysphagia secondary to uncontrolled reflux  . FOOT SURGERY Bilateral    "nerve"    . LEFT HEART  CATH AND CORONARY ANGIOGRAPHY N/A 12/29/2018   Procedure: LEFT HEART CATH AND CORONARY ANGIOGRAPHY;  Surgeon: Jettie Booze, MD;  Location: [place] CV LAB;  Service: Cardiovascular;  Laterality: N/A;  . LUNG BIOPSY    . MASS EXCISION Right 01/09/2013   Procedure: EXCISION OF NEOPLASM OF RIGHT  AXILLA  AND EXCISION OF NEOPLASM OF LEFT AXILLA;  Surgeon: Jamesetta So, MD;  Location: [facility] ORS;  Service: General;  Laterality: Right;  procedure end @ 08:23  . MYRINGOTOMY WITH TUBE PLACEMENT  Bilateral 04/28/2017   Procedure: BILATERAL MYRINGOTOMY WITH TUBE PLACEMENT;  Surgeon: Leta Baptist, MD;  Location: [place];  Service: ENT;  Laterality: Bilateral;  . REVISION OF ARTERIOVENOUS GORETEX GRAFT Left 05/04/2018   Procedure: TRANSPOSITION OF CEPHALIC VEIN ARTERIOVENOUS FISTULA LEFT ARM;  Surgeon: Rosetta Posner, MD;  Location: [place];  Service: Vascular;  Laterality: Left;  . SAVORY DILATION N/A 12/18/2016   Procedure: SAVORY DILATION;  Surgeon: Danie Binder, MD;  [facility];  Service: Endoscopy;  Laterality: N/A;     Current Meds  Medication Sig  . acetaminophen (TYLENOL) 500 MG tablet Take 1,000 mg by mouth every 8 (eight) hours as needed for mild pain or fever.   Marland Kitchen allopurinol (ZYLOPRIM) 100 MG tablet Take 100 mg by mouth daily.  Marland Kitchen amLODipine (NORVASC) 10 MG tablet TAKE 1 TABLET BY MOUTH ONCE DAILY. (Patient taking differently: Take 10 mg by mouth daily. )  . calcitRIOL (ROCALTROL) 0.25 MCG capsule   . cloNIDine (CATAPRES) 0.3 MG tablet TAKE (1/2) TABLET BY MOUTH TWICE DAILY.  Marland Kitchen Continuous Blood Gluc Sensor (FREESTYLE LIBRE 14 DAY SENSOR) MISC 1 each by Does not apply route every 14 (fourteen) days. Change every 2 weeks  . cyclobenzaprine (FLEXERIL) 5 MG tablet TAKE ONE TABLET BY MOUTH AT BEDTIME AS NEEDED FOR MUSCLE SPASMS.  Marland Kitchen dexlansoprazole (DEXILANT) 60 MG capsule TAKE 1 CAPSULE IN THE MORNING WITH FOOD  . diclofenac Sodium (VOLTAREN) 1 % GEL Apply 2 g topically daily as needed (for pain).   Marland Kitchen ezetimibe (ZETIA) 10 MG tablet TAKE 1 TABLET BY MOUTH ONCE DAILY. (Patient taking differently: Take 10 mg by mouth daily. )  . furosemide (LASIX) 80 MG tablet Take 80 mg by mouth 2 (two) times daily.  . GNP ASPIRIN LOW DOSE 81 MG EC tablet TAKE 1 TABLET BY MOUTH ONCE DAILY.  Marland Kitchen guaiFENesin (MUCINEX) 600 MG 12 hr tablet Take 1 tablet (600 mg total) by mouth 2 (two) times daily.  Marland Kitchen ibuprofen (ADVIL) 800 MG tablet Take 1 tablet (800 mg total) by mouth every 8 (eight) hours as needed.    . Insulin Glargine, 2 Unit Dial, (TOUJEO MAX SOLOSTAR) 300 UNIT/ML SOPN Inject 50-56 Units into the skin See admin instructions. Take after dialysis 3 day a week.  . Insulin Lispro (HUMALOG KWIKPEN) 200 UNIT/ML SOPN Inject 24-28 Units into the skin 3 (three) times daily before meals.  . isosorbide mononitrate (IMDUR) 30 MG 24 hr tablet TAKE 1 TABLET BY MOUTH ONCE DAILY. (Patient taking differently: Take 30 mg by mouth daily. )  . metoprolol succinate (TOPROL-XL) 50 MG 24 hr tablet Take 1 tablet (50 mg total) by mouth daily. Take with or immediately following a meal.  . ondansetron (ZOFRAN) 4 MG tablet Take 1 tablet (4 mg total) by mouth every 8 (eight) hours as needed for nausea or vomiting.  . potassium chloride SA (KLOR-CON) 20 MEQ tablet Take 1 tablet (20 mEq total) by mouth daily.  Marland Kitchen  potassium chloride SA (KLOR-CON) 20 MEQ tablet Take 1 tablet (20 mEq total) by mouth daily.  . pregabalin (LYRICA) 50 MG capsule Take 1 capsule (50 mg total) by mouth 3 (three) times daily.  . sertraline (ZOLOFT) 50 MG tablet TAKE 1 TABLET BY MOUTH ONCE DAILY. (Patient taking differently: Take 50 mg by mouth daily. )  . ULTICARE MINI PEN NEEDLES 31G X 6 MM MISC USE AS DIRECTED  . [name] 500 MG chewable tablet Chew 500 mg by mouth See admin instructions. Take 500 mg with each snack and meal  . zolpidem (AMBIEN) 10 MG tablet Take 10 mg by mouth at bedtime as needed for sleep.      Allergies:   Ace inhibitors, Penicillins, Statins, and Albuterol   ROS:   Please see the history of present illness.    All other systems reviewed and are negative.   Labs/Other Tests and Data Reviewed:    Recent Labs: 12/05/2018: Magnesium 1.9 04/03/2019: B Natriuretic Peptide 45.0 07/19/2019: ALT 27; BUN 35; Creatinine, Ser 5.96; Hemoglobin 11.2; Platelets 334; Sodium 139 07/27/2019: Potassium 3.6   Recent Lipid Panel Lab Results  Component Value Date/Time   CHOL 199 03/08/2018 12:34 PM   TRIG 242 (H) 03/08/2018 12:34 PM    HDL 37 (L) 03/08/2018 12:34 PM   CHOLHDL 5.4 03/08/2018 12:34 PM   LDLCALC 114 (H) 03/08/2018 12:34 PM   LDLCALC 109 (H) 11/12/2017 09:36 AM   LDLDIRECT 107 05/19/2016 01:44 PM    Wt Readings from Last 3 Encounters:  08/09/19 220 lb (99.8 kg)  07/25/19 220 lb 8 oz (100 kg)  07/19/19 213 lb (96.6 kg)     Objective:    Vital Signs:  BP (!) 152/58   Ht 5\' 4"  (1.626 m)   Wt 220 lb (99.8 kg)   BMI 37.76 kg/m    VITAL SIGNS:  reviewed GEN:  alert and oriented  RESPIRATORY:  no shortness of breath in conversation  PSYCH:  normal affect and mood   ASSESSMENT & PLAN:    1. Acute non-recurrent pansinusitis  - azithromycin (ZITHROMAX) 250 MG tablet; Take 2 tablets (500 mg) on the first day. Then take 1 tablet (250mg ) on days 2-5.  Dispense: 6 tablet; Refill: 0  2. Cough  Time:   Today, I have spent 10 minutes with the patient with telehealth technology discussing the above problems.     Medication Adjustments/Labs and Tests Ordered: Current medicines are reviewed at length with the patient today.  Concerns regarding medicines are outlined above.   Tests Ordered: No orders of the defined types were placed in this encounter.   Medication Changes: No orders of the defined types were placed in this encounter.   Disposition:  Follow up 3 months   Signed, Perlie Mayo, NP  08/09/2019 4:20 PM     [facility]

## 2019-08-14 ENCOUNTER — Telehealth: Payer: Self-pay | Admitting: *Deleted

## 2019-08-14 NOTE — Telephone Encounter (Signed)
FMLA for son Kathryn Beck to take care of mother received copied noted and sleeved

## 2019-08-16 ENCOUNTER — Telehealth: Payer: 59 | Admitting: Family Medicine

## 2019-08-18 ENCOUNTER — Other Ambulatory Visit: Payer: Self-pay | Admitting: Family Medicine

## 2019-08-24 IMAGING — MG DIGITAL SCREENING BILATERAL MAMMOGRAM WITH TOMO AND CAD
8 series · 8 of 24 positions shown · non-contrast
Comparison: Previous exam(s).

CLINICAL DATA: Screening.

EXAM:
DIGITAL SCREENING BILATERAL MAMMOGRAM WITH TOMO AND CAD

[R CC synth-2D]
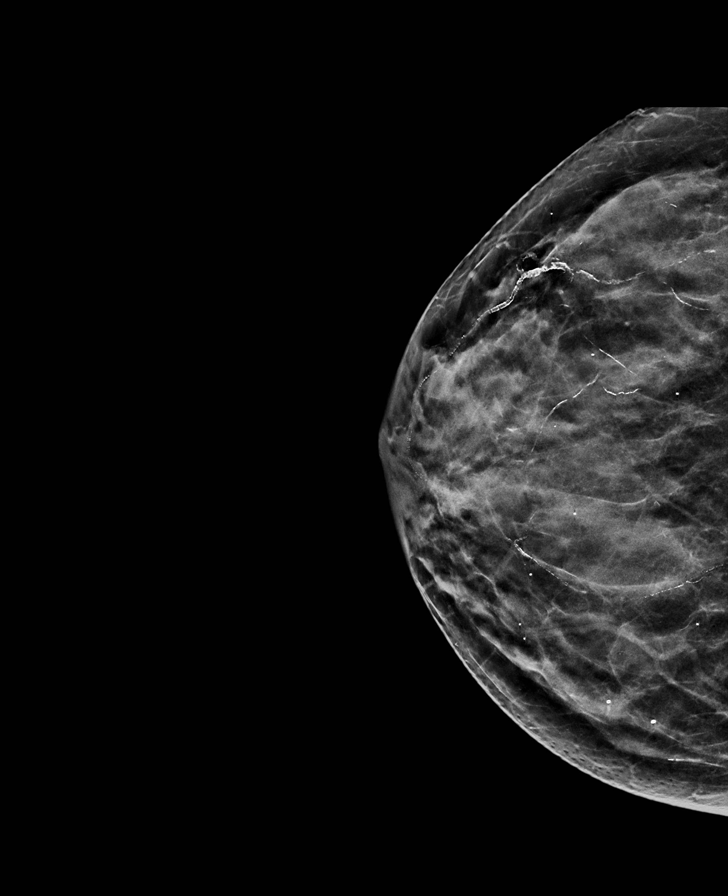

[R MLO synth-2D]
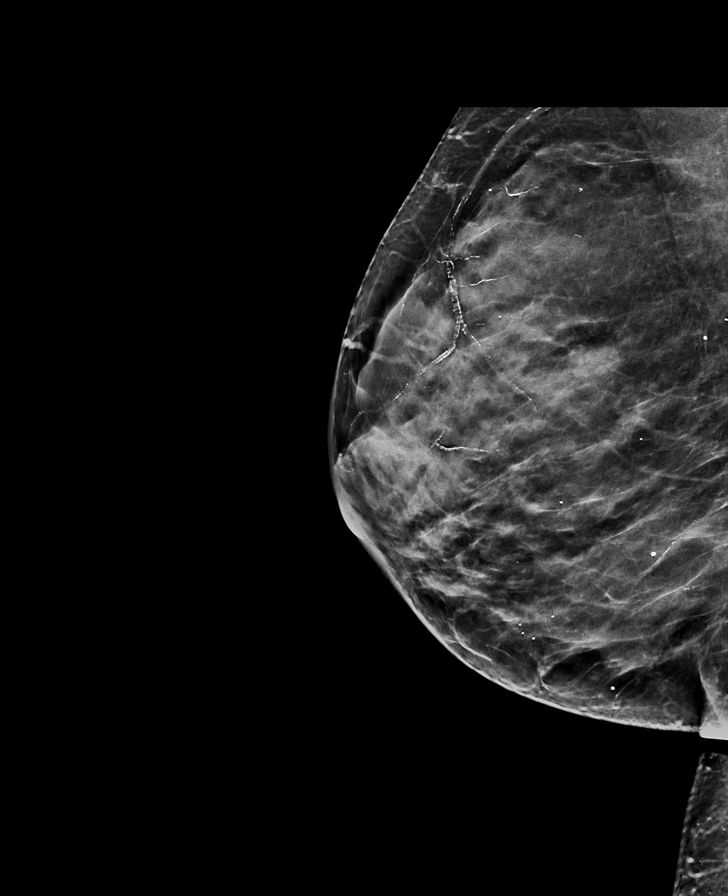

[L MLO synth-2D]
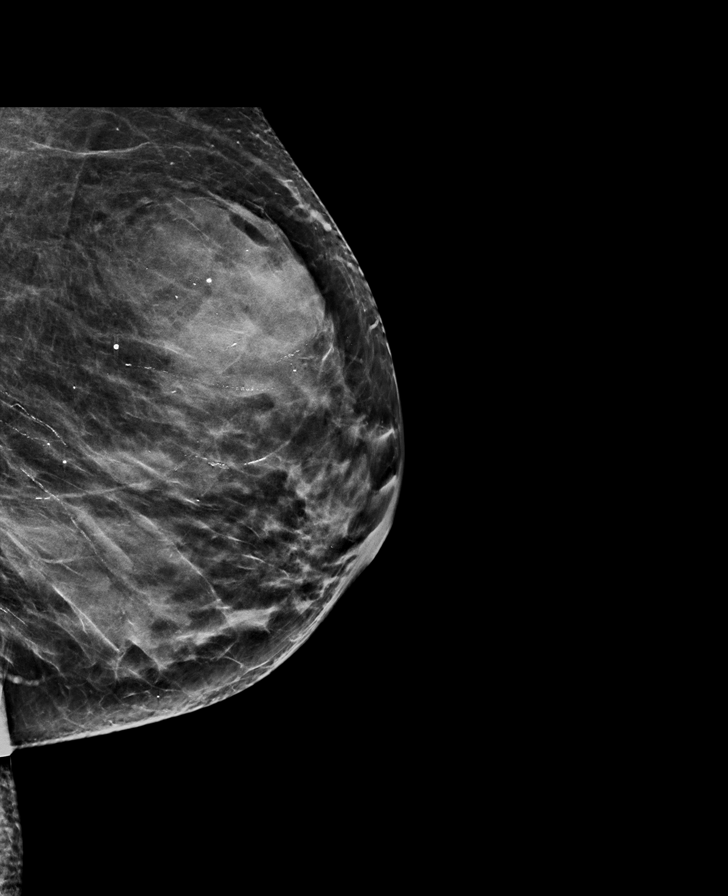

[L CC synth-2D]
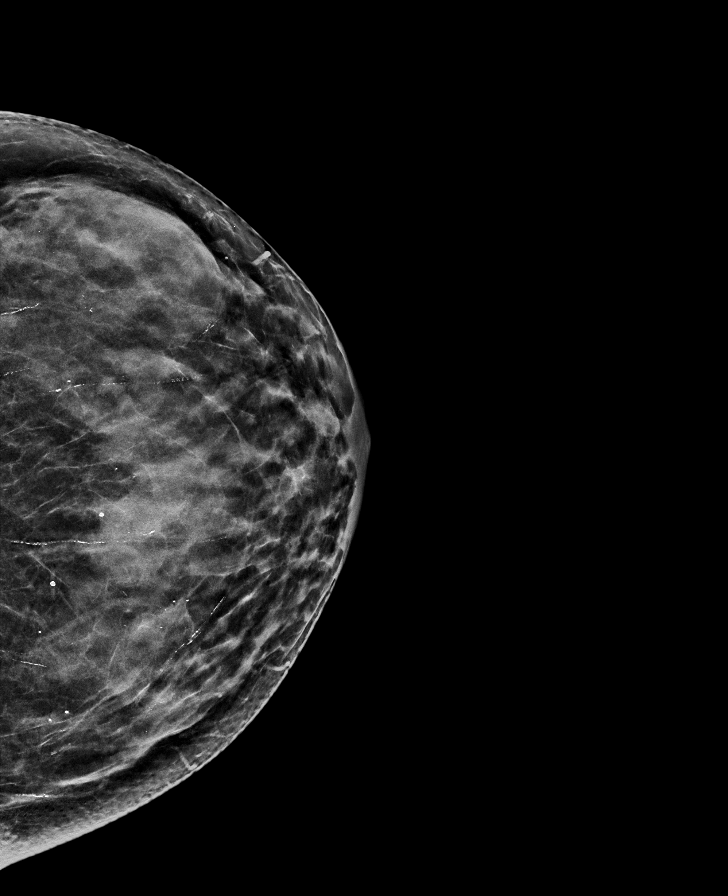

[L MLO tomo · tomo slice 33/66.0]
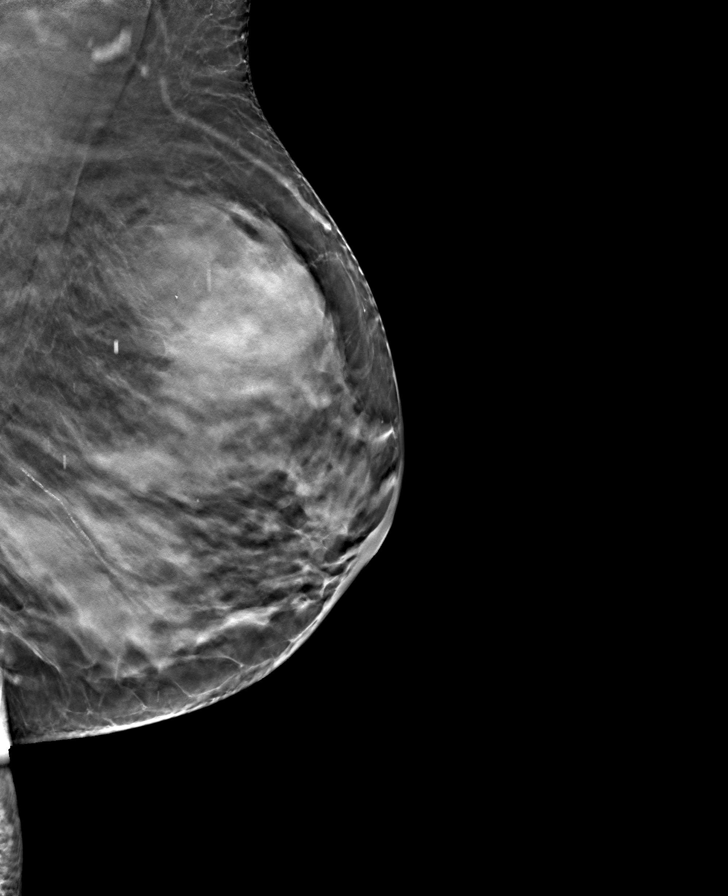

[R CC tomo · tomo slice 29/57.0]
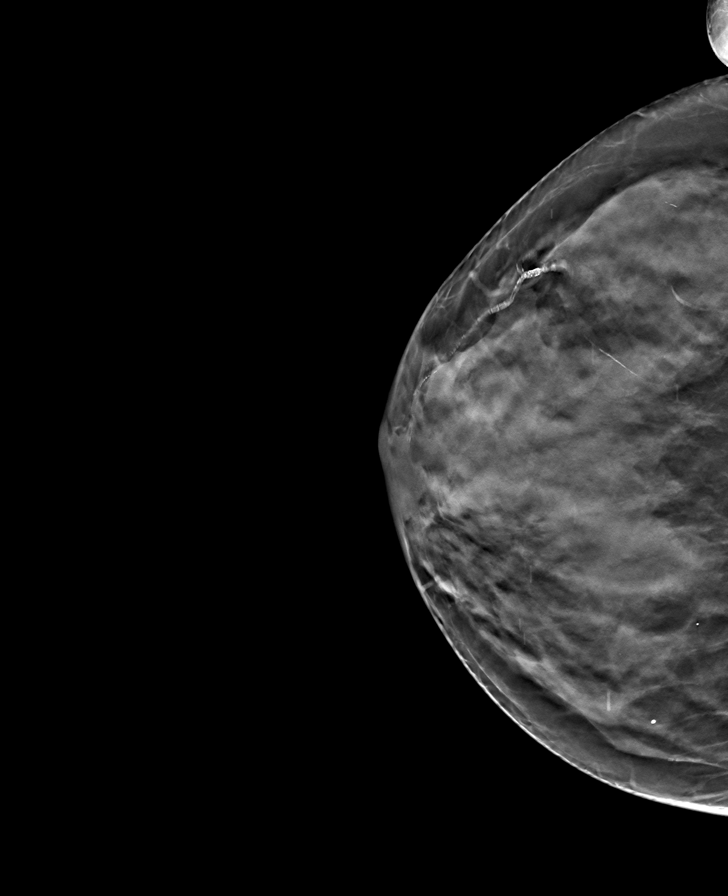

[L CC tomo · tomo slice 30/59.0]
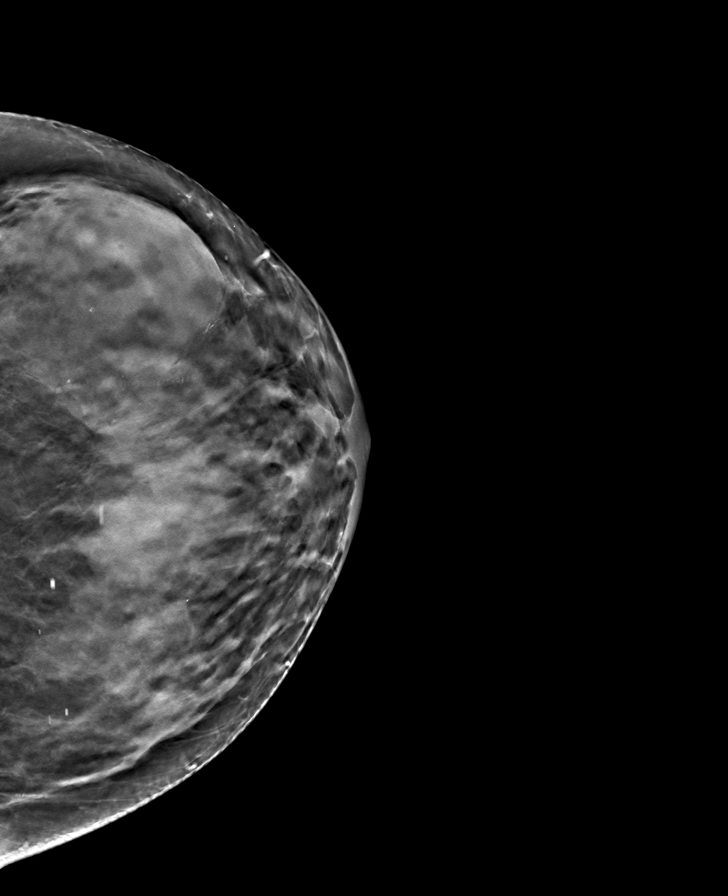

[R MLO tomo · tomo slice 32/63.0]
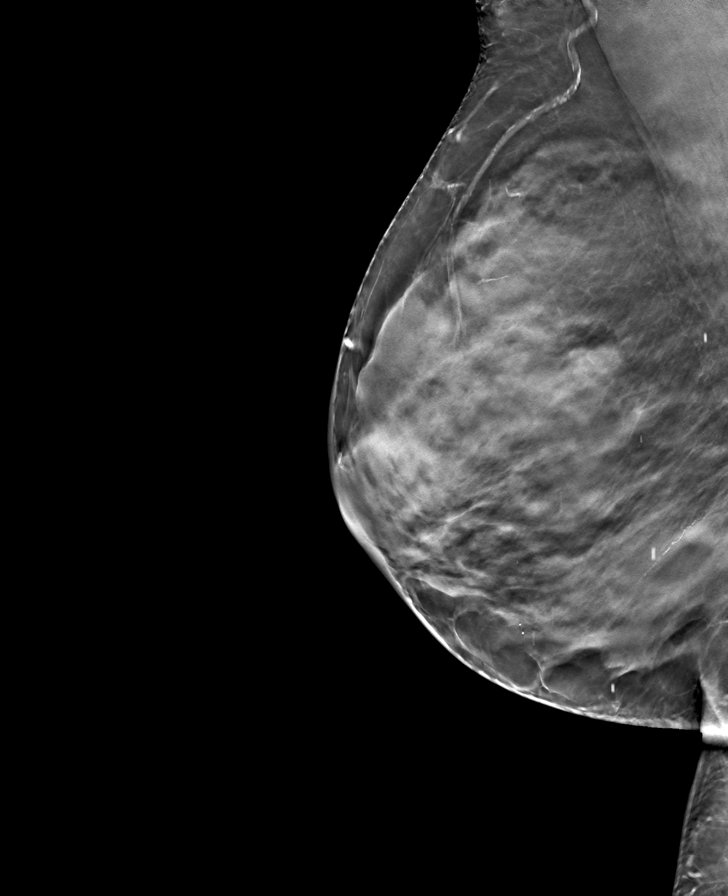

[8 of 24 positions shown; findings below may reference images not displayed]

ACR Breast Density Category d: The breast tissue is extremely dense,
which lowers the sensitivity of mammography.
FINDINGS: In the left breast, possible distortion warrants further evaluation.
In the right breast, no findings suspicious for malignancy. Images
were processed with CAD.
IMPRESSION: Further evaluation is suggested for possible distortion in the left
breast.

RECOMMENDATION:
Diagnostic mammogram and possibly ultrasound of the left breast.
(Code:[id number]-[id number])

The patient will be contacted regarding the findings, and additional
imaging will be scheduled.

BI-RADS CATEGORY  0: Incomplete. Need additional imaging evaluation
and/or prior mammograms for comparison.

## 2019-08-25 DIAGNOSIS — Z8489 Family history of other specified conditions: Secondary | ICD-10-CM

## 2019-08-30 ENCOUNTER — Other Ambulatory Visit: Payer: Self-pay | Admitting: Family Medicine

## 2019-08-31 ENCOUNTER — Ambulatory Visit: Payer: 59 | Admitting: Orthopedic Surgery

## 2019-09-05 ENCOUNTER — Ambulatory Visit: Payer: 59 | Admitting: Internal Medicine

## 2019-09-07 ENCOUNTER — Other Ambulatory Visit: Payer: Self-pay | Admitting: Family Medicine

## 2019-09-19 ENCOUNTER — Telehealth: Payer: Self-pay

## 2019-09-19 NOTE — Telephone Encounter (Signed)
  Patient Consent for Virtual Visit         Kathryn Beck has provided verbal consent on 09/19/2019 for a virtual visit (video or telephone).   CONSENT FOR VIRTUAL VISIT FOR:  Kathryn Beck  By participating in this virtual visit I agree to the following:  I hereby voluntarily request, consent and authorize [facility] and its employed or contracted physicians, physician assistants, nurse practitioners or other licensed health care professionals (the Practitioner), to provide me with telemedicine health care services (the "Services") as deemed necessary by the treating Practitioner. I acknowledge and consent to receive the Services by the Practitioner via telemedicine. I understand that the telemedicine visit will involve communicating with the Practitioner through live audiovisual communication technology and the disclosure of certain medical information by electronic transmission. I acknowledge that I have been given the opportunity to request an in-person assessment or other available alternative prior to the telemedicine visit and am voluntarily participating in the telemedicine visit.  I understand that I have the right to withhold or withdraw my consent to the use of telemedicine in the course of my care at any time, without affecting my right to future care or treatment, and that the Practitioner or I may terminate the telemedicine visit at any time. I understand that I have the right to inspect all information obtained and/or recorded in the course of the telemedicine visit and may receive copies of available information for a reasonable fee.  I understand that some of the potential risks of receiving the Services via telemedicine include:  Marland Kitchen Delay or interruption in medical evaluation due to technological equipment failure or disruption; . Information transmitted may not be sufficient (e.g. poor resolution of images) to allow for appropriate medical decision making by the Practitioner;  and/or  . In rare instances, security protocols could fail, causing a breach of personal health information.  Furthermore, I acknowledge that it is my responsibility to provide information about my medical history, conditions and care that is complete and accurate to the best of my ability. I acknowledge that Practitioner's advice, recommendations, and/or decision may be based on factors not within their control, such as incomplete or inaccurate data provided by me or distortions of diagnostic images or specimens that may result from electronic transmissions. I understand that the practice of medicine is not an exact science and that Practitioner makes no warranties or guarantees regarding treatment outcomes. I acknowledge that a copy of this consent can be made available to me via my patient portal ([place]), or I can request a printed copy by calling the office of [facility].    I understand that my insurance will be billed for this visit.   I have read or had this consent read to me. . I understand the contents of this consent, which adequately explains the benefits and risks of the Services being provided via telemedicine.  . I have been provided ample opportunity to ask questions regarding this consent and the Services and have had my questions answered to my satisfaction. . I give my informed consent for the services to be provided through the use of telemedicine in my medical care

## 2019-09-20 ENCOUNTER — Other Ambulatory Visit (HOSPITAL_COMMUNITY)
Admission: RE | Admit: 2019-09-20 | Discharge: 2019-09-20 | Disposition: A | Discharge status: Home / Self Care | Payer: 59 | Source: Ambulatory Visit | Attending: Family Medicine | Admitting: Family Medicine

## 2019-09-20 ENCOUNTER — Encounter: Payer: Self-pay | Admitting: Cardiology

## 2019-09-20 ENCOUNTER — Encounter: Payer: Self-pay | Admitting: Family Medicine

## 2019-09-20 ENCOUNTER — Ambulatory Visit (INDEPENDENT_AMBULATORY_CARE_PROVIDER_SITE_OTHER): Payer: 59 | Admitting: Family Medicine

## 2019-09-20 ENCOUNTER — Other Ambulatory Visit: Payer: Self-pay

## 2019-09-20 ENCOUNTER — Telehealth: Payer: 59 | Admitting: Cardiology

## 2019-09-20 VITALS — BP 158/69 | HR 94 | Resp 16 | Ht 64.0 in | Wt 197.0 lb

## 2019-09-20 DIAGNOSIS — Z1272 Encounter for screening for malignant neoplasm of vagina: Secondary | ICD-10-CM | POA: Diagnosis present

## 2019-09-20 DIAGNOSIS — Z01419 Encounter for gynecological examination (general) (routine) without abnormal findings: Secondary | ICD-10-CM

## 2019-09-20 MED ORDER — ZOLPIDEM TARTRATE 5 MG PO TABS: ORAL_TABLET | ORAL | 0 refills | Status: DC

## 2019-09-20 MED ORDER — ONDANSETRON HCL 4 MG PO TABS: 4.0000 mg | ORAL_TABLET | Freq: Three times a day (TID) | ORAL | 0 refills | Status: DC | PRN

## 2019-09-20 NOTE — Patient Instructions (Signed)
F/U in office as before call if you need me sooner.  Please ENSURE THAT YOU do GET YOUR 2ND COVID VACCINE, CHECK WITH PHARMACY TO SEE WHEN DUE AND GET IT!   Pap sent   Medications are prescribed for as needed use, use Ambien sparingly and practice good sleep habits  All the best as you move forward with kidney transplant also with your peritoneal dialysis at home  Thanks for choosing [facility], we consider it a privelige to serve you.

## 2019-09-21 ENCOUNTER — Other Ambulatory Visit: Payer: Self-pay | Admitting: Family Medicine

## 2019-09-21 LAB — CYTOLOGY - PAP
Comment: NEGATIVE
Diagnosis: NEGATIVE
High risk HPV: NEGATIVE

## 2019-09-21 MED ORDER — FLUCONAZOLE 150 MG PO TABS
150.0000 mg | ORAL_TABLET | Freq: Once | ORAL | 0 refills | Status: AC
Start: 2019-09-21 — End: 2019-09-21

## 2019-09-22 ENCOUNTER — Encounter: Payer: Self-pay | Admitting: Family Medicine

## 2019-09-22 DIAGNOSIS — Z01419 Encounter for gynecological examination (general) (routine) without abnormal findings: Secondary | ICD-10-CM | POA: Insufficient documentation

## 2019-09-22 NOTE — Progress Notes (Signed)
Acute Office Visit  Subjective:    Patient ID: Kathryn Beck, female    DOB: 1955/09/26, 64 y.o.   MRN: [id number]  Chief Complaint  Patient presents with  . Gynecologic Exam    Per [facility], she is needing a pelvic exam before July 12. Needs refill on ambien and zofran    HPI Patient is in today for gyne exam needed to complete requirements to be on kidney transplant list. She is s/p hysterectomy and has no recollection of an abnormal pap smear  Past Medical History:  Diagnosis Date  . Acid reflux   . Anemia   . Arthritis   . Axillary masses    Soft tissue - status post excision  . Back pain   . COVID-19 virus infection 04/06/2019  . Depression   . Diastolic heart failure ([place])   . End-stage renal disease ([place])    TTHSat  in [place]  . Essential hypertension   . History of blood transfusion   . History of claustrophobia   . History of pneumonia 2019  . Hypoxia 04/03/2019  . Ischemic heart disease    Abnormal Myoview April 2018 - medical therapy  . Mixed hyperlipidemia   . Obesity   . Pancreatitis   . Peritoneal dialysis catheter in place [facility])   . Pneumonia due to COVID-19 virus 04/02/2019  . Sleep apnea    Noncompliant with CPAP  . Type 2 diabetes mellitus ([place])     Past Surgical History:  Procedure Laterality Date  . ABDOMINAL HYSTERECTOMY    . AV FISTULA PLACEMENT Left 09/02/2017   Procedure: creation of left arm ARTERIOVENOUS (AV) FISTULA;  Surgeon: Serafina Mitchell, MD;  Location: [facility] OR;  Service: Vascular;  Laterality: Left;  . COLONOSCOPY  2008   Dr. Oneida Alar: normal   . COLONOSCOPY N/A 12/18/2016   Dr. Oneida Alar: multiple tubular adenomas, internal hemorrhoids. Surveillance in 3 years   . ESOPHAGEAL DILATION N/A 10/13/2015   Procedure: ESOPHAGEAL DILATION;  Surgeon: Rogene Houston, MD;  [facility];  Service: Endoscopy;  Laterality: N/A;  . ESOPHAGOGASTRODUODENOSCOPY N/A 10/13/2015   Dr. Laural Golden: chronic gastritis on path, no  H.pylori. Empiric dilation   . ESOPHAGOGASTRODUODENOSCOPY N/A 12/18/2016   Dr. Oneida Alar: mild gastritis. BRAVO study revealed uncontrolled GERD. Dysphagia secondary to uncontrolled reflux  . FOOT SURGERY Bilateral    "nerve"    . LEFT HEART CATH AND CORONARY ANGIOGRAPHY N/A 12/29/2018   Procedure: LEFT HEART CATH AND CORONARY ANGIOGRAPHY;  Surgeon: Jettie Booze, MD;  Location: [place] CV LAB;  Service: Cardiovascular;  Laterality: N/A;  . LUNG BIOPSY    . MASS EXCISION Right 01/09/2013   Procedure: EXCISION OF NEOPLASM OF RIGHT  AXILLA  AND EXCISION OF NEOPLASM OF LEFT AXILLA;  Surgeon: Jamesetta So, MD;  Location: [facility] ORS;  Service: General;  Laterality: Right;  procedure end @ 08:23  . MYRINGOTOMY WITH TUBE PLACEMENT Bilateral 04/28/2017   Procedure: BILATERAL MYRINGOTOMY WITH TUBE PLACEMENT;  Surgeon: Leta Baptist, MD;  Location: [place];  Service: ENT;  Laterality: Bilateral;  . REVISION OF ARTERIOVENOUS GORETEX GRAFT Left 05/04/2018   Procedure: TRANSPOSITION OF CEPHALIC VEIN ARTERIOVENOUS FISTULA LEFT ARM;  Surgeon: Rosetta Posner, MD;  Location: [place];  Service: Vascular;  Laterality: Left;  . SAVORY DILATION N/A 12/18/2016   Procedure: SAVORY DILATION;  Surgeon: Danie Binder, MD;  [facility];  Service: Endoscopy;  Laterality: N/A;    Family History  Problem Relation Age of Onset  . Hypertension Father   . Hypercholesterolemia Father   . Arthritis Father   . Hypertension Sister   . Hypercholesterolemia Sister   . Breast cancer Sister   . Hypertension Sister   . Colon cancer Neg Hx   . Colon polyps Neg Hx     Social History   Socioeconomic History  . Marital status: Married    Spouse name: larry   . Number of children: 2  . Years of education: 1  . Highest education level: 12th grade  Occupational History  . Occupation: retired   Tobacco Use  . Smoking status: Never Smoker  . Smokeless tobacco: Never Used  Vaping Use  . Vaping Use: Never used    Substance and Sexual Activity  . Alcohol use: No  . Drug use: No  . Sexual activity: Not Currently  Other Topics Concern  . Not on file  Social History Narrative   Lives alone with husband    Social Determinants of Health   Financial Resource Strain:   . Difficulty of Paying Living Expenses:   Food Insecurity:   . Worried About Charity fundraiser in the Last Year:   . Arboriculturist in the Last Year:   Transportation Needs:   . Film/video editor (Medical):   Marland Kitchen Lack of Transportation (Non-Medical):   Physical Activity:   . Days of Exercise per Week:   . Minutes of Exercise per Session:   Stress:   . Feeling of Stress :   Social Connections:   . Frequency of Communication with Friends and Family:   . Frequency of Social Gatherings with Friends and Family:   . Attends Religious Services:   . Active Member of Clubs or Organizations:   . Attends Archivist Meetings:   Marland Kitchen Marital Status:   Intimate Partner Violence:   . Fear of Current or Ex-Partner:   . Emotionally Abused:   Marland Kitchen Physically Abused:   . Sexually Abused:     Outpatient Medications Prior to Visit  Medication Sig Dispense Refill  . amLODipine (NORVASC) 10 MG tablet TAKE 1 TABLET BY MOUTH ONCE DAILY. 28 tablet 0  . aspirin EC 81 MG tablet Take 81 mg by mouth daily. Swallow whole.    . cloNIDine (CATAPRES) 0.1 MG tablet Take 0.1 mg by mouth 2 (two) times daily.    . Continuous Blood Gluc Sensor (FREESTYLE LIBRE 14 DAY SENSOR) MISC 1 each by Does not apply route every 14 (fourteen) days. Change every 2 weeks 2 each 11  . DULoxetine (CYMBALTA) 60 MG capsule Take 60 mg by mouth daily.    Marland Kitchen ezetimibe (ZETIA) 10 MG tablet TAKE 1 TABLET BY MOUTH ONCE DAILY. (Patient taking differently: Take 10 mg by mouth daily. ) 28 tablet 11  . furosemide (LASIX) 80 MG tablet Take 80 mg by mouth 2 (two) times daily.    Marland Kitchen ibuprofen (ADVIL) 800 MG tablet Take 1 tablet (800 mg total) by mouth every 8 (eight) hours as  needed. 90 tablet 1  . Insulin Glargine, 2 Unit Dial, (TOUJEO MAX SOLOSTAR) 300 UNIT/ML SOPN Inject 50-56 Units into the skin See admin instructions. Take after dialysis 3 day a week. 3 pen 11  . Insulin Lispro (HUMALOG KWIKPEN) 200 UNIT/ML SOPN Inject 24-28 Units into the skin 3 (three) times daily before meals. 30 mL 5  . isosorbide mononitrate (IMDUR) 30 MG 24 hr tablet TAKE 1 TABLET BY MOUTH ONCE DAILY. (  Patient taking differently: Take 30 mg by mouth daily. ) 28 tablet 11  . metoprolol succinate (TOPROL-XL) 50 MG 24 hr tablet Take 1 tablet (50 mg total) by mouth daily. Take with or immediately following a meal. 90 tablet 3  . midodrine (PROAMATINE) 10 MG tablet Take 10 mg by mouth in the morning and at bedtime.    . ondansetron (ZOFRAN) 4 MG tablet Take 1 tablet (4 mg total) by mouth every 8 (eight) hours as needed for nausea or vomiting. 30 tablet 1  . pregabalin (LYRICA) 50 MG capsule Take 1 capsule (50 mg total) by mouth 3 (three) times daily. 90 capsule 2  . sertraline (ZOLOFT) 50 MG tablet TAKE 1 TABLET BY MOUTH ONCE DAILY. (Patient taking differently: Take 50 mg by mouth daily. ) 28 tablet 4  . torsemide (DEMADEX) 100 MG tablet Take 100 mg by mouth 2 (two) times daily.    Marland Kitchen ULTICARE MINI PEN NEEDLES 31G X 6 MM MISC USE AS DIRECTED 100 each 0  . [name] 500 MG chewable tablet Chew 500 mg by mouth See admin instructions. Take 500 mg with each snack and meal    . cloNIDine (CATAPRES) 0.3 MG tablet TAKE (1/2) TABLET BY MOUTH TWICE DAILY. 28 tablet 11  . zolpidem (AMBIEN) 10 MG tablet Take 10 mg by mouth at bedtime as needed for sleep.     Marland Kitchen acetaminophen (TYLENOL) 500 MG tablet Take 1,000 mg by mouth every 8 (eight) hours as needed for mild pain or fever.     Marland Kitchen allopurinol (ZYLOPRIM) 100 MG tablet Take 100 mg by mouth daily.    Marland Kitchen azithromycin (ZITHROMAX) 250 MG tablet Take 2 tablets (500 mg) on the first day. Then take 1 tablet (250mg ) on days 2-5. 6 tablet 0  . calcitRIOL (ROCALTROL) 0.25  MCG capsule     . cyclobenzaprine (FLEXERIL) 5 MG tablet TAKE ONE TABLET BY MOUTH AT BEDTIME AS NEEDED FOR MUSCLE SPASMS. 30 tablet 0  . dexlansoprazole (DEXILANT) 60 MG capsule TAKE 1 CAPSULE IN THE MORNING WITH FOOD 28 capsule 6  . diclofenac Sodium (VOLTAREN) 1 % GEL Apply 2 g topically daily as needed (for pain).     . GNP ASPIRIN LOW DOSE 81 MG EC tablet TAKE 1 TABLET BY MOUTH ONCE DAILY. 30 tablet 11  . guaiFENesin (MUCINEX) 600 MG 12 hr tablet Take 1 tablet (600 mg total) by mouth 2 (two) times daily. 20 tablet 0  . potassium chloride SA (KLOR-CON) 20 MEQ tablet Take 1 tablet (20 mEq total) by mouth daily. 30 tablet 1  . potassium chloride SA (KLOR-CON) 20 MEQ tablet Take 1 tablet (20 mEq total) by mouth daily. 60 tablet 3  . promethazine-dextromethorphan (PROMETHAZINE-DM) 6.25-15 MG/5ML syrup Take 2.5 mLs by mouth 2 (two) times daily as needed for cough. 118 mL 0   Facility-Administered Medications Prior to Visit  Medication Dose Route Frequency Provider Last Rate Last Admin  . 0.9 %  sodium chloride infusion   Intravenous Continuous Lockamy, Randi L, NP-C 20 mL/hr at 07/19/19 1641 Rate Verify at 07/19/19 1641    Allergies  Allergen Reactions  . Ace Inhibitors Anaphylaxis and Swelling  . Penicillins Itching, Swelling and Other (See Comments)    Did it involve swelling of the face/tongue/throat, SOB, or low BP? Unknown Did it involve sudden or severe rash/hives, skin peeling, or any reaction on the inside of your mouth or nose? Unknown Did you need to seek medical attention at a hospital or doctor's office?  Unknown When did it last happen?years  If all above answers are "NO", may proceed with cephalosporin use.   . Statins Other (See Comments)    elevated LFT's    . Albuterol Swelling    Review of Systems     Objective:    Physical Exam  BP (!) 158/69   Pulse 94   Resp 16   Ht 5\' 4"  (1.626 m)   Wt 197 lb (89.4 kg)   SpO2 98%   BMI 33.81 kg/m  Wt Readings  from Last 3 Encounters:  09/20/19 197 lb (89.4 kg)  08/09/19 220 lb (99.8 kg)  07/25/19 220 lb 8 oz (100 kg)    Health Maintenance Due  Topic Date Due  . COVID-19 Vaccine (1) Never done    There are no preventive care reminders to display for this patient.   Lab Results  Component Value Date   TSH 4.16 12/24/2016   Lab Results  Component Value Date   WBC 6.9 07/19/2019   HGB 11.2 (L) 07/19/2019   HCT 36.7 07/19/2019   MCV 82.3 07/19/2019   PLT 334 07/19/2019   Lab Results  Component Value Date   NA 139 07/19/2019   K 3.6 07/27/2019   CO2 25 07/19/2019   GLUCOSE 166 (H) 07/19/2019   BUN 35 (H) 07/19/2019   CREATININE 5.96 (H) 07/19/2019   BILITOT 0.5 07/19/2019   ALKPHOS 129 (H) 07/19/2019   AST 19 07/19/2019   ALT 27 07/19/2019   PROT 7.0 07/19/2019   ALBUMIN 3.5 07/19/2019   CALCIUM 8.8 (L) 07/19/2019   ANIONGAP 18 (H) 07/19/2019   Lab Results  Component Value Date   CHOL 199 03/08/2018   Lab Results  Component Value Date   HDL 37 (L) 03/08/2018   Lab Results  Component Value Date   LDLCALC 114 (H) 03/08/2018   Lab Results  Component Value Date   TRIG 242 (H) 03/08/2018   Lab Results  Component Value Date   CHOLHDL 5.4 03/08/2018   Lab Results  Component Value Date   HGBA1C 7.8 07/17/2019   Chest : CTA bilaterally CVS: heart sound S1 and S 2 no murmurs Abdomen obese, tender superficially had procedure 1 day ago in 3rd attempt to  Be maintained on peritoneal dialysis. Bandages are clean and dry  Gyne:  External genitalia is normal, no ulcers noted, no inguinal adenopathy Int genitalia: vaginal walls normal, white discharge present Uterus absent, cervix absent, no adnexal masses, no tenderness on exam    Assessment & Plan:   Problem List Items Addressed This Visit    None    Visit Diagnoses    Encounter for screening for malignant neoplasm of vagina    -  Primary   Relevant Orders   Cytology - PAP (Completed)       Meds ordered  this encounter  Medications  . zolpidem (AMBIEN) 5 MG tablet    Sig: Take one tablet by mouth at night as needed for sleep.    Dispense:  30 tablet    Refill:  0  . ondansetron (ZOFRAN) 4 MG tablet    Sig: Take 1 tablet (4 mg total) by mouth every 8 (eight) hours as needed for nausea or vomiting.    Dispense:  20 tablet    Refill:  0   Encounter for gynecological examination Exam as documented and pap sent    Tula Nakayama, MD

## 2019-09-22 NOTE — Assessment & Plan Note (Signed)
Exam as documented and pap sent 

## 2019-10-02 ENCOUNTER — Ambulatory Visit: Payer: 59 | Admitting: Internal Medicine

## 2019-10-03 ENCOUNTER — Other Ambulatory Visit: Payer: Self-pay

## 2019-10-03 ENCOUNTER — Encounter: Payer: Self-pay | Admitting: Family Medicine

## 2019-10-03 ENCOUNTER — Telehealth (INDEPENDENT_AMBULATORY_CARE_PROVIDER_SITE_OTHER): Payer: 59 | Admitting: Family Medicine

## 2019-10-03 VITALS — BP 158/69 | Ht 67.0 in | Wt 197.0 lb

## 2019-10-03 DIAGNOSIS — Z0000 Encounter for general adult medical examination without abnormal findings: Secondary | ICD-10-CM

## 2019-10-03 NOTE — Progress Notes (Signed)
Subjective:   Kathryn Beck is a 64 y.o. female who presents for Medicare Annual (Subsequent) preventive examination.   Location of Patient: Home Location of Provider: Office Consent was obtain for visit to be over via telephone  I am speaking with the correct person using two identifiers.  Review of Systems    yes Cardiac Risk Factors include: hypertension;sedentary lifestyle;obesity (BMI >30kg/m2);dyslipidemia;diabetes mellitus     Objective:    Today's Vitals   10/03/19 1309  BP: (!) 158/69  Weight: 197 lb (89.4 kg)  Height: 5\' 7"  (1.702 m)  PainSc: 6   PainLoc: Abdomen   Body mass index is 30.85 kg/m.  Advanced Directives 07/25/2019 07/19/2019 04/03/2019 03/28/2019 03/20/2019 03/03/2019 02/23/2019  Does Patient Have a Medical Advance Directive? No No No No No No No  Type of Advance Directive - - - - - - -  Does patient want to make changes to medical advance directive? - - - - - - -  Would patient like information on creating a medical advance directive? No - Patient declined No - Patient declined No - Patient declined - No - Patient declined No - Patient declined No - Patient declined  Pre-existing out of facility DNR order (yellow form or pink MOST form) - - - - - - -    Current Medications (verified) Outpatient Encounter Medications as of 10/03/2019  Medication Sig  . amLODipine (NORVASC) 10 MG tablet TAKE 1 TABLET BY MOUTH ONCE DAILY.  Marland Kitchen aspirin EC 81 MG tablet Take 81 mg by mouth daily. Swallow whole.  . calcitRIOL (ROCALTROL) 0.25 MCG capsule Take 0.25 capsules by mouth.  . cloNIDine (CATAPRES) 0.1 MG tablet Take 0.1 mg by mouth 2 (two) times daily.  . Continuous Blood Gluc Sensor (FREESTYLE LIBRE 14 DAY SENSOR) MISC 1 each by Does not apply route every 14 (fourteen) days. Change every 2 weeks  . DULoxetine (CYMBALTA) 60 MG capsule Take 60 mg by mouth daily.  Marland Kitchen ezetimibe (ZETIA) 10 MG tablet TAKE 1 TABLET BY MOUTH ONCE DAILY. (Patient taking differently: Take 10  mg by mouth daily. )  . furosemide (LASIX) 80 MG tablet Take 80 mg by mouth 2 (two) times daily.  Marland Kitchen ibuprofen (ADVIL) 800 MG tablet Take 1 tablet (800 mg total) by mouth every 8 (eight) hours as needed.  . Insulin Glargine, 2 Unit Dial, (TOUJEO MAX SOLOSTAR) 300 UNIT/ML SOPN Inject 50-56 Units into the skin See admin instructions. Take after dialysis 3 day a week.  . Insulin Lispro (HUMALOG KWIKPEN) 200 UNIT/ML SOPN Inject 24-28 Units into the skin 3 (three) times daily before meals.  . isosorbide mononitrate (IMDUR) 30 MG 24 hr tablet TAKE 1 TABLET BY MOUTH ONCE DAILY. (Patient taking differently: Take 30 mg by mouth daily. )  . metoprolol succinate (TOPROL-XL) 50 MG 24 hr tablet Take 1 tablet (50 mg total) by mouth daily. Take with or immediately following a meal.  . midodrine (PROAMATINE) 10 MG tablet Take 10 mg by mouth in the morning and at bedtime.  . ondansetron (ZOFRAN) 4 MG tablet Take 1 tablet (4 mg total) by mouth every 8 (eight) hours as needed for nausea or vomiting.  . pregabalin (LYRICA) 50 MG capsule Take 1 capsule (50 mg total) by mouth 3 (three) times daily.  . sertraline (ZOLOFT) 50 MG tablet TAKE 1 TABLET BY MOUTH ONCE DAILY. (Patient taking differently: Take 50 mg by mouth daily. )  . torsemide (DEMADEX) 100 MG tablet Take 100 mg by mouth  2 (two) times daily.  Marland Kitchen ULTICARE MINI PEN NEEDLES 31G X 6 MM MISC USE AS DIRECTED  . [name] 500 MG chewable tablet Chew 500 mg by mouth See admin instructions. Take 500 mg with each snack and meal  . zolpidem (AMBIEN) 5 MG tablet Take one tablet by mouth at night as needed for sleep.  . [DISCONTINUED] ondansetron (ZOFRAN) 4 MG tablet Take 1 tablet (4 mg total) by mouth every 8 (eight) hours as needed for nausea or vomiting.  Marland Kitchen HYDROcodone-acetaminophen (NORCO/VICODIN) 5-325 MG tablet Take 1 tablet by mouth every 4 (four) hours as needed.  . [DISCONTINUED] FLUoxetine (PROZAC) 10 MG capsule Take 10 mg by mouth daily.    . [DISCONTINUED]  glipiZIDE (GLUCOTROL) 10 MG tablet Take 10 mg by mouth 2 (two) times daily before a meal.     Facility-Administered Encounter Medications as of 10/03/2019  Medication  . 0.9 %  sodium chloride infusion    Allergies (verified) Ace inhibitors, Penicillins, Statins, and Albuterol   History: Past Medical History:  Diagnosis Date  . Acid reflux   . Anemia   . Arthritis   . Axillary masses    Soft tissue - status post excision  . Back pain   . COVID-19 virus infection 04/06/2019  . Depression   . Diastolic heart failure ([place])   . End-stage renal disease ([place])    TTHSat  in [place]  . Essential hypertension   . History of blood transfusion   . History of claustrophobia   . History of pneumonia 2019  . Hypoxia 04/03/2019  . Ischemic heart disease    Abnormal Myoview April 2018 - medical therapy  . Mixed hyperlipidemia   . Obesity   . Pancreatitis   . Peritoneal dialysis catheter in place [facility] [place])   . Pneumonia due to COVID-19 virus 04/02/2019  . Sleep apnea    Noncompliant with CPAP  . Type 2 diabetes mellitus ([place])    Past Surgical History:  Procedure Laterality Date  . ABDOMINAL HYSTERECTOMY    . AV FISTULA PLACEMENT Left 09/02/2017   Procedure: creation of left arm ARTERIOVENOUS (AV) FISTULA;  Surgeon: Serafina Mitchell, MD;  Location: [facility] OR;  Service: Vascular;  Laterality: Left;  . COLONOSCOPY  2008   Dr. Oneida Alar: normal   . COLONOSCOPY N/A 12/18/2016   Dr. Oneida Alar: multiple tubular adenomas, internal hemorrhoids. Surveillance in 3 years   . ESOPHAGEAL DILATION N/A 10/13/2015   Procedure: ESOPHAGEAL DILATION;  Surgeon: Rogene Houston, MD;  Location: [facility];  Service: Endoscopy;  Laterality: N/A;  . ESOPHAGOGASTRODUODENOSCOPY N/A 10/13/2015   Dr. Laural Golden: chronic gastritis on path, no H.pylori. Empiric dilation   . ESOPHAGOGASTRODUODENOSCOPY N/A 12/18/2016   Dr. Oneida Alar: mild gastritis. BRAVO study revealed uncontrolled GERD. Dysphagia secondary to uncontrolled reflux  .  FOOT SURGERY Bilateral    "nerve"    . LEFT HEART CATH AND CORONARY ANGIOGRAPHY N/A 12/29/2018   Procedure: LEFT HEART CATH AND CORONARY ANGIOGRAPHY;  Surgeon: Jettie Booze, MD;  Location: [facility];  Service: Cardiovascular;  Laterality: N/A;  . LUNG BIOPSY    . MASS EXCISION Right 01/09/2013   Procedure: EXCISION OF NEOPLASM OF RIGHT  AXILLA  AND EXCISION OF NEOPLASM OF LEFT AXILLA;  Surgeon: Jamesetta So, MD;  Location: [facility] ORS;  Service: General;  Laterality: Right;  procedure end @ 08:23  . MYRINGOTOMY WITH TUBE PLACEMENT Bilateral 04/28/2017   Procedure: BILATERAL MYRINGOTOMY WITH TUBE PLACEMENT;  Surgeon: Leta Baptist, MD;  Location:  [facility] OR;  Service: ENT;  Laterality: Bilateral;  . REVISION OF ARTERIOVENOUS GORETEX GRAFT Left 05/04/2018   Procedure: TRANSPOSITION OF CEPHALIC VEIN ARTERIOVENOUS FISTULA LEFT ARM;  Surgeon: Rosetta Posner, MD;  Location: [place];  Service: Vascular;  Laterality: Left;  . SAVORY DILATION N/A 12/18/2016   Procedure: SAVORY DILATION;  Surgeon: Danie Binder, MD;  [facility];  Service: Endoscopy;  Laterality: N/A;   Family History  Problem Relation Age of Onset  . Hypertension Father   . Hypercholesterolemia Father   . Arthritis Father   . Hypertension Sister   . Hypercholesterolemia Sister   . Breast cancer Sister   . Hypertension Sister   . Colon cancer Neg Hx   . Colon polyps Neg Hx    Social History   Socioeconomic History  . Marital status: Married    Spouse name: larry   . Number of children: 2  . Years of education: 75  . Highest education level: 12th grade  Occupational History  . Occupation: retired   Tobacco Use  . Smoking status: Never Smoker  . Smokeless tobacco: Never Used  Vaping Use  . Vaping Use: Never used  Substance and Sexual Activity  . Alcohol use: No  . Drug use: No  . Sexual activity: Not Currently  Other Topics Concern  . Not on file  Social History Narrative   Lives alone with husband     Social Determinants of Health   Financial Resource Strain: Low Risk   . Difficulty of Paying Living Expenses: Not hard at all  Food Insecurity: No Food Insecurity  . Worried About Charity fundraiser in the Last Year: Never true  . Ran Out of Food in the Last Year: Never true  Transportation Needs: No Transportation Needs  . Lack of Transportation (Medical): No  . Lack of Transportation (Non-Medical): No  Physical Activity: Insufficiently Active  . Days of Exercise per Week: 2 days  . Minutes of Exercise per Session: 10 min  Stress: No Stress Concern Present  . Feeling of Stress : Only a little  Social Connections: Moderately Integrated  . Frequency of Communication with Friends and Family: More than three times a week  . Frequency of Social Gatherings with Friends and Family: More than three times a week  . Attends Religious Services: More than 4 times per year  . Active Member of Clubs or Organizations: No  . Attends Archivist Meetings: Never  . Marital Status: Married    Tobacco Counseling Counseling given: Not Answered   Clinical Intake:     Pain Score: 6            Diabetic?yes         Activities of Daily Living In your present state of health, do you have any difficulty performing the following activities: 10/03/2019 04/03/2019  Hearing? N N  Vision? N N  Difficulty concentrating or making decisions? N N  Walking or climbing stairs? Y N  Dressing or bathing? N N  Doing errands, shopping? Y N  Some recent data might be hidden    Patient Care Team: Fayrene Helper, MD as PCP - General Domenic Polite Aloha Gell, MD as PCP - Cardiology (Cardiology) Gala Romney Cristopher Estimable, MD as Consulting Physician (Gastroenterology) Fran Lowes, MD (Inactive) as Consulting Physician (Nephrology) Dialysis, Theressa Stamps  Indicate any recent Medical Services you may have received from other than [facility] providers in the past year (date may be  approximate).  Assessment:   This is a routine wellness examination for Kathryn Beck.  Hearing/Vision screen No exam data present  Dietary issues and exercise activities discussed: Current Exercise Habits: Home exercise routine, Time (Minutes): 10, Frequency (Times/Week): 2, Weekly Exercise (Minutes/Week): 20, Intensity: Mild  Goals    . DIET - EAT MORE FRUITS AND VEGETABLES     To increase her energy     . DIET - REDUCE SUGAR INTAKE    . Increase physical activity      Depression Screen PHQ 2/9 Scores 10/03/2019 08/09/2019 07/17/2019 07/04/2019 04/14/2019 03/23/2019 10/27/2018  PHQ - 2 Score 3 0 0 0 0 1 0  PHQ- 9 Score 12 0 - - - 4 -    Fall Risk Fall Risk  10/03/2019 09/20/2019 08/09/2019 07/17/2019 07/04/2019  Falls in the past year? 1 1 0 0 0  Number falls in past yr: 1 0 0 0 0  Injury with Fall? 1 0 0 0 0  Risk for fall due to : History of fall(s);Impaired balance/gait;Impaired mobility - No Fall Risks - -  Follow up Falls evaluation completed;Education provided;Falls prevention discussed;Follow up appointment - Falls evaluation completed Falls evaluation completed Falls evaluation completed    Any stairs in or around the home? Yes  If so, are there any without handrails? Yes  Home free of loose throw rugs in walkways, pet beds, electrical cords, etc? Yes  Adequate lighting in your home to reduce risk of falls? Yes   ASSISTIVE DEVICES UTILIZED TO PREVENT FALLS:  Life alert? No  Use of a cane, walker or w/c? Yes  Grab bars in the bathroom? No  Shower chair or bench in shower? Yes  Elevated toilet seat or a handicapped toilet? No   TIMED UP AND GO:  Was the test performed? No .  Length of time to ambulate 10 feet: NA sec.     Cognitive Function:     6CIT Screen 10/03/2019 02/10/2018  What Year? 0 points 0 points  What month? 0 points 0 points  What time? 0 points 0 points  Count back from 20 0 points 0 points  Months in reverse 0 points 2 points  Repeat phrase 0  points 2 points  Total Score 0 4    Immunizations Immunization History  Administered Date(s) Administered  . H1N1 01/16/2008  . Influenza Whole 12/28/2004, 02/24/2006, 12/07/2006, 02/01/2008, 01/28/2009, 11/25/2010  . Influenza,inj,Quad PF,6+ Mos 12/22/2012, 03/12/2015, 01/14/2016, 11/24/2016, 11/09/2017  . PPD Test 03/22/2018  . Pneumococcal Polysaccharide-23 01/28/2009, 07/25/2015  . Td 11/12/2003  . Tdap 10/02/2014  . Zoster Recombinat (Shingrix) 12/24/2016    TDAP status: Up to date Flu Vaccine status: Up to date Pneumococcal vaccine status: Up to date Covid-19 vaccine status: Completed vaccines  Qualifies for Shingles Vaccine? No   Zostavax completed Yes   Shingrix Completed?: Yes  Screening Tests Health Maintenance  Topic Date Due  . COVID-19 Vaccine (1) Never done  . INFLUENZA VACCINE  10/22/2019  . HEMOGLOBIN A1C  01/16/2020  . OPHTHALMOLOGY EXAM  01/30/2020  . FOOT EXAM  03/22/2020  . MAMMOGRAM  05/24/2021  . PAP SMEAR-Modifier  09/20/2022  . TETANUS/TDAP  10/01/2024  . COLONOSCOPY  12/19/2026  . PNEUMOCOCCAL POLYSACCHARIDE VACCINE AGE 66-64 HIGH RISK  Completed  . Hepatitis C Screening  Completed  . HIV Screening  Completed    Health Maintenance  Health Maintenance Due  Topic Date Due  . COVID-19 Vaccine (1) Never done    Colorectal cancer screening: Completed 2020. Repeat every 10  years Mammogram status: Completed 05/25/19. Repeat every year Bone Density status: Completed 2 . Results reflect: Bone density results: NORMAL. Repeat every 2 years.  Lung Cancer Screening: (Low Dose CT Chest recommended if Age 45-80 years, 30 pack-year currently smoking OR have quit w/in 15years.) does not qualify.   Lung Cancer Screening Referral: no  Additional Screening:  Hepatitis C Screening: does not qualify; Completed   Vision Screening: Recommended annual ophthalmology exams for early detection of glaucoma and other disorders of the eye. Is the patient up to  date with their annual eye exam?  Yes  Who is the provider or what is the name of the office in which the patient attends annual eye exams? no If pt is not established with a provider, would they like to be referred to a provider to establish care? No .   Dental Screening: Recommended annual dental exams for proper oral hygiene  Community Resource Referral / Chronic Care Management: CRR required this visit?  No   CCM required this visit?  No      Plan:     1. Encounter for Medicare annual wellness exam   I have personally reviewed and noted the following in the patient's chart:   . Medical and social history . Use of alcohol, tobacco or illicit drugs  . Current medications and supplements . Functional ability and status . Nutritional status . Physical activity . Advanced directives . List of other physicians . Hospitalizations, surgeries, and ER visits in previous 12 months . Vitals . Screenings to include cognitive, depression, and falls . Referrals and appointments  In addition, I have reviewed and discussed with patient certain preventive protocols, quality metrics, and best practice recommendations. A written personalized care plan for preventive services as well as general preventive health recommendations were provided to patient.     Perlie Mayo, NP   10/03/2019    I provided 20 minutes of non-face-to-face time during this encounter.

## 2019-10-03 NOTE — Patient Instructions (Signed)
Kathryn Beck , Thank you for taking time to come for your Medicare Wellness Visit. I appreciate your ongoing commitment to your health goals. Please review the following plan we discussed and let me know if I can assist you in the future.   Screening recommendations/referrals: Colonoscopy: up to date Mammogram: up todate Bone Density: Due at 4 Recommended yearly ophthalmology/optometry visit for glaucoma screening and checkup Recommended yearly dental visit for hygiene and checkup  Vaccinations: Influenza vaccine: up to date Pneumococcal vaccine: up to date Tdap vaccine: up to date Shingles vaccine: up to date  Next appointment: office will call   Preventive Care 40-64 Years, Female Preventive care refers to lifestyle choices and visits with your health care provider that can promote health and wellness. What does preventive care include?  A yearly physical exam. This is also called an annual well check.  Dental exams once or twice a year.  Routine eye exams. Ask your health care provider how often you should have your eyes checked.  Personal lifestyle choices, including:  Daily care of your teeth and gums.  Regular physical activity.  Eating a healthy diet.  Avoiding tobacco and drug use.  Limiting alcohol use.  Practicing safe sex.  Taking low-dose aspirin daily starting at age 58.  Taking vitamin and mineral supplements as recommended by your health care provider. What happens during an annual well check? The services and screenings done by your health care provider during your annual well check will depend on your age, overall health, lifestyle risk factors, and family history of disease. Counseling  Your health care provider may ask you questions about your:  Alcohol use.  Tobacco use.  Drug use.  Emotional well-being.  Home and relationship well-being.  Sexual activity.  Eating habits.  Work and work Statistician.  Method of birth  control.  Menstrual cycle.  Pregnancy history. Screening  You may have the following tests or measurements:  Height, weight, and BMI.  Blood pressure.  Lipid and cholesterol levels. These may be checked every 5 years, or more frequently if you are over 87 years old.  Skin check.  Lung cancer screening. You may have this screening every year starting at age 66 if you have a 30-pack-year history of smoking and currently smoke or have quit within the past 15 years.  Fecal occult blood test (FOBT) of the stool. You may have this test every year starting at age 70.  Flexible sigmoidoscopy or colonoscopy. You may have a sigmoidoscopy every 5 years or a colonoscopy every 10 years starting at age 6.  Hepatitis C blood test.  Hepatitis B blood test.  Sexually transmitted disease (STD) testing.  Diabetes screening. This is done by checking your blood sugar (glucose) after you have not eaten for a while (fasting). You may have this done every 1-3 years.  Mammogram. This may be done every 1-2 years. Talk to your health care provider about when you should start having regular mammograms. This may depend on whether you have a family history of breast cancer.  BRCA-related cancer screening. This may be done if you have a family history of breast, ovarian, tubal, or peritoneal cancers.  Pelvic exam and Pap test. This may be done every 3 years starting at age 49. Starting at age 35, this may be done every 5 years if you have a Pap test in combination with an HPV test.  Bone density scan. This is done to screen for osteoporosis. You may have this scan if you  are at high risk for osteoporosis. Discuss your test results, treatment options, and if necessary, the need for more tests with your health care provider. Vaccines  Your health care provider may recommend certain vaccines, such as:  Influenza vaccine. This is recommended every year.  Tetanus, diphtheria, and acellular pertussis (Tdap,  Td) vaccine. You may need a Td booster every 10 years.  Zoster vaccine. You may need this after age 50.  Pneumococcal 13-valent conjugate (PCV13) vaccine. You may need this if you have certain conditions and were not previously vaccinated.  Pneumococcal polysaccharide (PPSV23) vaccine. You may need one or two doses if you smoke cigarettes or if you have certain conditions. Talk to your health care provider about which screenings and vaccines you need and how often you need them. This information is not intended to replace advice given to you by your health care provider. Make sure you discuss any questions you have with your health care provider. Document Released: 04/05/2015 Document Revised: 11/27/2015 Document Reviewed: 01/08/2015 Elsevier Interactive Patient Education  2017 [employer] Prevention in the Home Falls can cause injuries. They can happen to people of all ages. There are many things you can do to make your home safe and to help prevent falls. What can I do on the outside of my home?  Regularly fix the edges of walkways and driveways and fix any cracks.  Remove anything that might make you trip as you walk through a door, such as a raised step or threshold.  Trim any bushes or trees on the path to your home.  Use bright outdoor lighting.  Clear any walking paths of anything that might make someone trip, such as rocks or tools.  Regularly check to see if handrails are loose or broken. Make sure that both sides of any steps have handrails.  Any raised decks and porches should have guardrails on the edges.  Have any leaves, snow, or ice cleared regularly.  Use sand or salt on walking paths during winter.  Clean up any spills in your garage right away. This includes oil or grease spills. What can I do in the bathroom?  Use night lights.  Install grab bars by the toilet and in the tub and shower. Do not use towel bars as grab bars.  Use non-skid mats or  decals in the tub or shower.  If you need to sit down in the shower, use a plastic, non-slip stool.  Keep the floor dry. Clean up any water that spills on the floor as soon as it happens.  Remove soap buildup in the tub or shower regularly.  Attach bath mats securely with double-sided non-slip rug tape.  Do not have throw rugs and other things on the floor that can make you trip. What can I do in the bedroom?  Use night lights.  Make sure that you have a light by your bed that is easy to reach.  Do not use any sheets or blankets that are too big for your bed. They should not hang down onto the floor.  Have a firm chair that has side arms. You can use this for support while you get dressed.  Do not have throw rugs and other things on the floor that can make you trip. What can I do in the kitchen?  Clean up any spills right away.  Avoid walking on wet floors.  Keep items that you use a lot in easy-to-reach places.  If you need  to reach something above you, use a strong step stool that has a grab bar.  Keep electrical cords out of the way.  Do not use floor polish or wax that makes floors slippery. If you must use wax, use non-skid floor wax.  Do not have throw rugs and other things on the floor that can make you trip. What can I do with my stairs?  Do not leave any items on the stairs.  Make sure that there are handrails on both sides of the stairs and use them. Fix handrails that are broken or loose. Make sure that handrails are as long as the stairways.  Check any carpeting to make sure that it is firmly attached to the stairs. Fix any carpet that is loose or worn.  Avoid having throw rugs at the top or bottom of the stairs. If you do have throw rugs, attach them to the floor with carpet tape.  Make sure that you have a light switch at the top of the stairs and the bottom of the stairs. If you do not have them, ask someone to add them for you. What else can I do to  help prevent falls?  Wear shoes that:  Do not have high heels.  Have rubber bottoms.  Are comfortable and fit you well.  Are closed at the toe. Do not wear sandals.  If you use a stepladder:  Make sure that it is fully opened. Do not climb a closed stepladder.  Make sure that both sides of the stepladder are locked into place.  Ask someone to hold it for you, if possible.  Clearly mark and make sure that you can see:  Any grab bars or handrails.  First and last steps.  Where the edge of each step is.  Use tools that help you move around (mobility aids) if they are needed. These include:  Canes.  Walkers.  Scooters.  Crutches.  Turn on the lights when you go into a dark area. Replace any light bulbs as soon as they burn out.  Set up your furniture so you have a clear path. Avoid moving your furniture around.  If any of your floors are uneven, fix them.  If there are any pets around you, be aware of where they are.  Review your medicines with your doctor. Some medicines can make you feel dizzy. This can increase your chance of falling. Ask your doctor what other things that you can do to help prevent falls. This information is not intended to replace advice given to you by your health care provider. Make sure you discuss any questions you have with your health care provider. Document Released: 01/03/2009 Document Revised: 08/15/2015 Document Reviewed: 04/13/2014 Elsevier Interactive Patient Education  2017 [employer].

## 2019-10-09 ENCOUNTER — Other Ambulatory Visit: Payer: Self-pay | Admitting: Family Medicine

## 2019-10-13 ENCOUNTER — Encounter: Payer: Self-pay | Admitting: Cardiology

## 2019-10-13 ENCOUNTER — Other Ambulatory Visit: Payer: Self-pay

## 2019-10-13 ENCOUNTER — Ambulatory Visit (INDEPENDENT_AMBULATORY_CARE_PROVIDER_SITE_OTHER): Payer: 59 | Admitting: Cardiology

## 2019-10-13 VITALS — BP 170/64 | HR 89 | Ht 64.0 in | Wt 190.0 lb

## 2019-10-13 DIAGNOSIS — N186 End stage renal disease: Secondary | ICD-10-CM

## 2019-10-13 DIAGNOSIS — E782 Mixed hyperlipidemia: Secondary | ICD-10-CM

## 2019-10-13 DIAGNOSIS — Z992 Dependence on renal dialysis: Secondary | ICD-10-CM | POA: Diagnosis not present

## 2019-10-13 DIAGNOSIS — I10 Essential (primary) hypertension: Secondary | ICD-10-CM

## 2019-10-13 NOTE — Progress Notes (Signed)
Cardiology Office Note  Date: 10/13/2019   ID: Kathryn, Beck [birth year], MRN [id number]  PCP:  Fayrene Helper, MD  Cardiologist:  Rozann Lesches, MD Electrophysiologist:  None   Chief Complaint  Patient presents with  . Cardiac follow-up    History of Present Illness: Kathryn Beck is a 64 y.o. female last seen by Kathryn Beck in November 2020.  She presents for a follow-up visit.  She states that she transitioned from peritoneal dialysis back to hemodialysis, tolerating this well via left arm fistula.  She is following with Kathryn Beck. Also remains on the renal transplant list at [facility], states that she has a family donor and has pending follow-up.  Cardiac evaluation in October of last year was reassuring.  LVEF was 60 to 65% with moderate LVH, mild mitral and tricuspid regurgitation noted as well.  Cardiac catheterization demonstrated normal coronary arteries.  I reviewed her medications which are outlined below.  She continues to follow with Kathryn Beck for primary care.  Past Medical History:  Diagnosis Date  . Acid reflux   . Anemia   . Arthritis   . Axillary masses    Soft tissue - status post excision  . Back pain   . COVID-19 virus infection 04/06/2019  . Depression   . End-stage renal disease ([place])    TTHSat  in [place]  . Essential hypertension   . History of blood transfusion   . History of cardiac catheterization    Normal coronary arteries October 2020  . History of claustrophobia   . History of pneumonia 2019  . Hypoxia 04/03/2019  . Mixed hyperlipidemia   . Obesity   . Pancreatitis   . Peritoneal dialysis catheter in place [facility])   . Pneumonia due to COVID-19 virus 04/02/2019  . Sleep apnea    Noncompliant with CPAP  . Type 2 diabetes mellitus ([place])     Past Surgical History:  Procedure Laterality Date  . ABDOMINAL HYSTERECTOMY    . AV FISTULA PLACEMENT Left 09/02/2017   Procedure: creation of left arm ARTERIOVENOUS (AV) FISTULA;   Surgeon: Serafina Mitchell, MD;  Location: [facility] OR;  Service: Vascular;  Laterality: Left;  . COLONOSCOPY  2008   Dr. Oneida Alar: normal   . COLONOSCOPY N/A 12/18/2016   Dr. Oneida Alar: multiple tubular adenomas, internal hemorrhoids. Surveillance in 3 years   . ESOPHAGEAL DILATION N/A 10/13/2015   Procedure: ESOPHAGEAL DILATION;  Surgeon: Rogene Houston, MD;  [facility];  Service: Endoscopy;  Laterality: N/A;  . ESOPHAGOGASTRODUODENOSCOPY N/A 10/13/2015   Dr. Laural Golden: chronic gastritis on path, no H.pylori. Empiric dilation   . ESOPHAGOGASTRODUODENOSCOPY N/A 12/18/2016   Dr. Oneida Alar: mild gastritis. BRAVO study revealed uncontrolled GERD. Dysphagia secondary to uncontrolled reflux  . FOOT SURGERY Bilateral    "nerve"    . LEFT HEART CATH AND CORONARY ANGIOGRAPHY N/A 12/29/2018   Procedure: LEFT HEART CATH AND CORONARY ANGIOGRAPHY;  Surgeon: Jettie Booze, MD;  Location: [place] CV LAB;  Service: Cardiovascular;  Laterality: N/A;  . LUNG BIOPSY    . MASS EXCISION Right 01/09/2013   Procedure: EXCISION OF NEOPLASM OF RIGHT  AXILLA  AND EXCISION OF NEOPLASM OF LEFT AXILLA;  Surgeon: Jamesetta So, MD;  Location: [facility] ORS;  Service: General;  Laterality: Right;  procedure end @ 08:23  . MYRINGOTOMY WITH TUBE PLACEMENT Bilateral 04/28/2017   Procedure: BILATERAL MYRINGOTOMY WITH TUBE PLACEMENT;  Surgeon: Leta Baptist, MD;  Location: [place];  Service: ENT;  Laterality: Bilateral;  . REVISION OF ARTERIOVENOUS GORETEX GRAFT Left 05/04/2018   Procedure: TRANSPOSITION OF CEPHALIC VEIN ARTERIOVENOUS FISTULA LEFT ARM;  Surgeon: Rosetta Posner, MD;  Location: [place];  Service: Vascular;  Laterality: Left;  . SAVORY DILATION N/A 12/18/2016   Procedure: SAVORY DILATION;  Surgeon: Danie Binder, MD;  [facility];  Service: Endoscopy;  Laterality: N/A;    Current Outpatient Medications  Medication Sig Dispense Refill  . amLODipine (NORVASC) 10 MG tablet TAKE 1 TABLET BY MOUTH ONCE DAILY. 28  tablet 0  . aspirin EC 81 MG tablet Take 81 mg by mouth daily. Swallow whole.    . calcitRIOL (ROCALTROL) 0.25 MCG capsule Take 0.25 capsules by mouth.    . cloNIDine (CATAPRES) 0.1 MG tablet Take 0.1 mg by mouth 2 (two) times daily.    . Continuous Blood Gluc Sensor (FREESTYLE LIBRE 14 DAY SENSOR) MISC 1 each by Does not apply route every 14 (fourteen) days. Change every 2 weeks 2 each 11  . DULoxetine (CYMBALTA) 60 MG capsule Take 60 mg by mouth daily.    Marland Kitchen ezetimibe (ZETIA) 10 MG tablet TAKE 1 TABLET BY MOUTH ONCE DAILY. 28 tablet 11  . furosemide (LASIX) 80 MG tablet Take 80 mg by mouth 2 (two) times daily.    Marland Kitchen HYDROcodone-acetaminophen (NORCO/VICODIN) 5-325 MG tablet Take 1 tablet by mouth every 4 (four) hours as needed.    Marland Kitchen ibuprofen (ADVIL) 800 MG tablet Take 1 tablet (800 mg total) by mouth every 8 (eight) hours as needed. 90 tablet 1  . Insulin Glargine, 2 Unit Dial, (TOUJEO MAX SOLOSTAR) 300 UNIT/ML SOPN Inject 50-56 Units into the skin See admin instructions. Take after dialysis 3 day a week. 3 pen 11  . Insulin Lispro (HUMALOG KWIKPEN) 200 UNIT/ML SOPN Inject 24-28 Units into the skin 3 (three) times daily before meals. 30 mL 5  . isosorbide mononitrate (IMDUR) 30 MG 24 hr tablet TAKE 1 TABLET BY MOUTH ONCE DAILY. (Patient taking differently: Take 30 mg by mouth daily. ) 28 tablet 11  . metoprolol succinate (TOPROL-XL) 50 MG 24 hr tablet Take 1 tablet (50 mg total) by mouth daily. Take with or immediately following a meal. 90 tablet 3  . midodrine (PROAMATINE) 10 MG tablet Take 10 mg by mouth in the morning and at bedtime.    . ondansetron (ZOFRAN) 4 MG tablet Take 1 tablet (4 mg total) by mouth every 8 (eight) hours as needed for nausea or vomiting. 20 tablet 0  . pregabalin (LYRICA) 50 MG capsule Take 1 capsule (50 mg total) by mouth 3 (three) times daily. 90 capsule 2  . sertraline (ZOLOFT) 50 MG tablet TAKE 1 TABLET BY MOUTH ONCE DAILY. (Patient taking differently: Take 50 mg by  mouth daily. ) 28 tablet 4  . torsemide (DEMADEX) 100 MG tablet Take 100 mg by mouth 2 (two) times daily.    Marland Kitchen ULTICARE MINI PEN NEEDLES 31G X 6 MM MISC USE AS DIRECTED 100 each 0  . [name] 500 MG chewable tablet Chew 500 mg by mouth See admin instructions. Take 500 mg with each snack and meal    . zolpidem (AMBIEN) 5 MG tablet Take one tablet by mouth at night as needed for sleep. 30 tablet 0   No current facility-administered medications for this visit.   Facility-Administered Medications Ordered in Other Visits  Medication Dose Route Frequency Provider Last Rate Last Admin  . 0.9 %  sodium chloride infusion  Intravenous Continuous Lockamy, Randi L, NP-C 20 mL/hr at 07/19/19 1641 Rate Verify at 07/19/19 1641   Allergies:  Ace inhibitors, Penicillins, Statins, and Albuterol   ROS:   No palpitations or syncope.  Physical Exam: VS:  BP (!) 170/64   Pulse 89   Ht 5\' 4"  (1.626 m)   Wt 190 lb (86.2 kg)   SpO2 96%   BMI 32.61 kg/m , BMI Body mass index is 32.61 kg/m.  Wt Readings from Last 3 Encounters:  10/13/19 190 lb (86.2 kg)  10/03/19 197 lb (89.4 kg)  09/20/19 197 lb (89.4 kg)    General: Patient appears comfortable at rest. HEENT: Conjunctiva and lids normal, wearing a mask. Neck: Supple, no elevated JVP or carotid bruits, no thyromegaly. Lungs: Clear to auscultation, nonlabored breathing at rest. Cardiac: Regular rate and rhythm, no S3, 2/6 systolic murmur, no pericardial rub. Abdomen: Soft, bowel sounds present. Extremities: Left arm AV fistula with bruit and thrill.  ECG:  An ECG dated 04/02/2019 was personally reviewed today and demonstrated:  Sinus rhythm.  Recent Labwork: 12/05/2018: Magnesium 1.9 04/03/2019: B Natriuretic Peptide 45.0 07/19/2019: ALT 27; AST 19; BUN 35; Creatinine, Ser 5.96; Hemoglobin 11.2; Platelets 334; Sodium 139 07/27/2019: Potassium 3.6     Component Value Date/Time   CHOL 199 03/08/2018 1234   TRIG 242 (H) 03/08/2018 1234   HDL 37 (L)  03/08/2018 1234   CHOLHDL 5.4 03/08/2018 1234   VLDL 48 (H) 03/08/2018 1234   LDLCALC 114 (H) 03/08/2018 1234   LDLCALC 109 (H) 11/12/2017 0936   LDLDIRECT 107 05/19/2016 1344    Other Studies Reviewed Today:  Echocardiogram 01/17/2019: 1. Left ventricular ejection fraction, by visual estimation, is 60 to  65%. The left ventricle has normal function. There is moderately increased  left ventricular hypertrophy.  2. Left ventricular diastolic Doppler parameters are consistent with  impaired relaxation pattern of LV diastolic filling.  3. Global right ventricle has normal systolic function.The right  ventricular size is normal. No increase in right ventricular wall  thickness.  4. Left atrial size was moderately dilated.  5. Right atrial size was normal.  6. The mitral valve is grossly normal. Mild mitral valve regurgitation.  7. The tricuspid valve is grossly normal. Tricuspid valve regurgitation  is mild.  8. The aortic valve is tricuspid Aortic valve regurgitation was not  visualized by color flow Doppler.  9. The pulmonic valve was grossly normal. Pulmonic valve regurgitation is  not visualized by color flow Doppler.  10. The inferior vena cava is dilated in size with >50% respiratory  variability, suggesting right atrial pressure of 8 mmHg.   Cardiac catheterization 12/29/2018:  Large, widely patent coronary arteries.  LV end diastolic pressure is mildly elevated.  There is no aortic valve stenosis.   No significant CAD.  Continue medical therapy.  Assessment and Plan:  1.  Normal coronary arteries by cardiac catheterization in October 2020.  2.  Hypertensive heart disease with LVEF 60 to 65% and moderate LVH.  She follows with Kathryn Beck on multimodal antihypertensive therapy.  No heart failure symptoms.  Currently on Norvasc, clonidine, Imdur, and Toprol-XL.  Also has some low blood pressures with hemodialysis and uses ProAmatine.  3.  ESRD on hemodialysis.   She is on the renal transplant list at [facility].  4.  Mixed hyperlipidemia on Zetia.  Medication Adjustments/Labs and Tests Ordered: Current medicines are reviewed at length with the patient today.  Concerns regarding medicines are outlined above.   Tests  Ordered: No orders of the defined types were placed in this encounter.   Medication Changes: No orders of the defined types were placed in this encounter.   Disposition:  Follow up with Kathryn Beck, we can see her back as needed.  Signed, Satira Sark, MD, [facility] 10/13/2019 10:02 AM    [facility] [place], [place], [place] [address] Phone: [phone]; Fax: [phone]

## 2019-10-13 NOTE — Patient Instructions (Signed)
Continue same medications  No ordered test  Follow up as needed

## 2019-10-20 ENCOUNTER — Other Ambulatory Visit: Payer: Self-pay | Admitting: Family Medicine

## 2019-10-23 ENCOUNTER — Encounter: Payer: Self-pay | Admitting: Orthopedic Surgery

## 2019-10-23 ENCOUNTER — Other Ambulatory Visit: Payer: Self-pay

## 2019-10-23 ENCOUNTER — Ambulatory Visit (INDEPENDENT_AMBULATORY_CARE_PROVIDER_SITE_OTHER): Payer: 59 | Admitting: Orthopedic Surgery

## 2019-10-23 ENCOUNTER — Ambulatory Visit: Payer: 59

## 2019-10-23 ENCOUNTER — Encounter: Payer: Self-pay | Admitting: Emergency Medicine

## 2019-10-23 ENCOUNTER — Ambulatory Visit
Admission: EM | Admit: 2019-10-23 | Discharge: 2019-10-23 | Disposition: A | Discharge status: Home / Self Care | Payer: 59 | Attending: Emergency Medicine | Admitting: Emergency Medicine

## 2019-10-23 ENCOUNTER — Ambulatory Visit (INDEPENDENT_AMBULATORY_CARE_PROVIDER_SITE_OTHER): Payer: 59 | Admitting: Family Medicine

## 2019-10-23 ENCOUNTER — Encounter: Payer: Self-pay | Admitting: Family Medicine

## 2019-10-23 ENCOUNTER — Other Ambulatory Visit: Payer: Self-pay | Admitting: Family Medicine

## 2019-10-23 VITALS — BP 150/70 | HR 84 | Temp 97.9°F | Resp 18 | Ht 64.0 in | Wt 196.0 lb

## 2019-10-23 VITALS — BP 179/81 | HR 80 | Ht 64.0 in | Wt 196.0 lb

## 2019-10-23 DIAGNOSIS — M545 Low back pain, unspecified: Secondary | ICD-10-CM

## 2019-10-23 DIAGNOSIS — E1140 Type 2 diabetes mellitus with diabetic neuropathy, unspecified: Secondary | ICD-10-CM

## 2019-10-23 DIAGNOSIS — M25561 Pain in right knee: Secondary | ICD-10-CM

## 2019-10-23 DIAGNOSIS — I10 Essential (primary) hypertension: Secondary | ICD-10-CM

## 2019-10-23 DIAGNOSIS — G8929 Other chronic pain: Secondary | ICD-10-CM

## 2019-10-23 DIAGNOSIS — Z20822 Contact with and (suspected) exposure to covid-19: Secondary | ICD-10-CM | POA: Diagnosis not present

## 2019-10-23 DIAGNOSIS — Z794 Long term (current) use of insulin: Secondary | ICD-10-CM

## 2019-10-23 DIAGNOSIS — I5032 Chronic diastolic (congestive) heart failure: Secondary | ICD-10-CM | POA: Diagnosis not present

## 2019-10-23 DIAGNOSIS — M5410 Radiculopathy, site unspecified: Secondary | ICD-10-CM

## 2019-10-23 DIAGNOSIS — Z01812 Encounter for preprocedural laboratory examination: Secondary | ICD-10-CM

## 2019-10-23 MED ORDER — HYDROCODONE-ACETAMINOPHEN 5-325 MG PO TABS: 1.0000 | ORAL_TABLET | Freq: Four times a day (QID) | ORAL | 0 refills | Status: AC | PRN

## 2019-10-23 NOTE — Discharge Instructions (Signed)
COVID testing ordered.  It will take between 2-5 days for test results.  Someone will contact you regarding abnormal results.    In the meantime:  If you were to develop symptoms: You should remain isolated in your home for 10 days from symptom onset AND greater than 72 hours after symptoms resolution (absence of fever without the use of fever-reducing medication and improvement in respiratory symptoms), whichever is longer If you have had exposure: you should remain in quarantine for 7 days from exposure.  However, if symptoms develop you must self isolate and return for retesting Get plenty of rest and push fluids Follow up with PCP as needed Call or go to the ED if you have any new symptoms such as fever, cough, shortness of breath, chest tightness, chest pain, turning blue, changes in mental status, etc...  

## 2019-10-23 NOTE — Patient Instructions (Signed)
F/U as before , call if you need me sooner  You are referred urgently to Orthopedics, we will give you appt before you leave  Careful not to fall  Appt preferred Mon, Wed or Friday as omn dialysis on Tuesday and Thursday.Able to go after 11 on Tuesday or Thursday  Thanks for choosing [facility], we consider it a privelige to serve you.

## 2019-10-23 NOTE — Assessment & Plan Note (Addendum)
3 week posterior right knee pain worsening, unable to bend knee, rated at 9, unstable, urgent Ortho eval

## 2019-10-23 NOTE — ED Provider Notes (Signed)
Kathryn Beck   [id number] 10/23/19 Arrival Time: 4270   CC: COVID test  SUBJECTIVE: History from: patient.  Kathryn Beck is a 64 y.o. female who presents for COVID testing for surgery.  Denies sick exposure to COVID, flu or strep.  Denies recent travel.  Denies aggravating or alleviating symptoms.  Denies previous COVID infection.   Denies fever, chills, fatigue, nasal congestion, rhinorrhea, sore throat, cough, SOB, wheezing, chest pain, nausea, vomiting, changes in bowel or bladder habits.    ROS: As per HPI.  All other pertinent ROS negative.     Past Medical History:  Diagnosis Date  . Acid reflux   . Anemia   . Arthritis   . Axillary masses    Soft tissue - status post excision  . Back pain   . COVID-19 virus infection 04/06/2019  . Depression   . End-stage renal disease ([place])    TTHSat  in [place]  . Essential hypertension   . History of blood transfusion   . History of cardiac catheterization    Normal coronary arteries October 2020  . History of claustrophobia   . History of pneumonia 2019  . Hypoxia 04/03/2019  . Mixed hyperlipidemia   . Obesity   . Pancreatitis   . Peritoneal dialysis catheter in place [facility])   . Pneumonia due to COVID-19 virus 04/02/2019  . Sleep apnea    Noncompliant with CPAP  . Type 2 diabetes mellitus ([place])    Past Surgical History:  Procedure Laterality Date  . ABDOMINAL HYSTERECTOMY    . AV FISTULA PLACEMENT Left 09/02/2017   Procedure: creation of left arm ARTERIOVENOUS (AV) FISTULA;  Surgeon: Serafina Mitchell, MD;  Location: [facility] OR;  Service: Vascular;  Laterality: Left;  . COLONOSCOPY  2008   Dr. Oneida Alar: normal   . COLONOSCOPY N/A 12/18/2016   Dr. Oneida Alar: multiple tubular adenomas, internal hemorrhoids. Surveillance in 3 years   . ESOPHAGEAL DILATION N/A 10/13/2015   Procedure: ESOPHAGEAL DILATION;  Surgeon: Rogene Houston, MD;  [facility];  Service: Endoscopy;  Laterality: N/A;  .  ESOPHAGOGASTRODUODENOSCOPY N/A 10/13/2015   Dr. Laural Golden: chronic gastritis on path, no H.pylori. Empiric dilation   . ESOPHAGOGASTRODUODENOSCOPY N/A 12/18/2016   Dr. Oneida Alar: mild gastritis. BRAVO study revealed uncontrolled GERD. Dysphagia secondary to uncontrolled reflux  . FOOT SURGERY Bilateral    "nerve"    . LEFT HEART CATH AND CORONARY ANGIOGRAPHY N/A 12/29/2018   Procedure: LEFT HEART CATH AND CORONARY ANGIOGRAPHY;  Surgeon: Jettie Booze, MD;  Location: [place] CV LAB;  Service: Cardiovascular;  Laterality: N/A;  . LUNG BIOPSY    . MASS EXCISION Right 01/09/2013   Procedure: EXCISION OF NEOPLASM OF RIGHT  AXILLA  AND EXCISION OF NEOPLASM OF LEFT AXILLA;  Surgeon: Jamesetta So, MD;  Location: [facility] ORS;  Service: General;  Laterality: Right;  procedure end @ 08:23  . MYRINGOTOMY WITH TUBE PLACEMENT Bilateral 04/28/2017   Procedure: BILATERAL MYRINGOTOMY WITH TUBE PLACEMENT;  Surgeon: Leta Baptist, MD;  Location: [place];  Service: ENT;  Laterality: Bilateral;  . REVISION OF ARTERIOVENOUS GORETEX GRAFT Left 05/04/2018   Procedure: TRANSPOSITION OF CEPHALIC VEIN ARTERIOVENOUS FISTULA LEFT ARM;  Surgeon: Rosetta Posner, MD;  Location: [place];  Service: Vascular;  Laterality: Left;  . SAVORY DILATION N/A 12/18/2016   Procedure: SAVORY DILATION;  Surgeon: Danie Binder, MD;  [facility];  Service: Endoscopy;  Laterality: N/A;   Allergies  Allergen Reactions  . Ace  Inhibitors Anaphylaxis and Swelling  . Penicillins Itching, Swelling and Other (See Comments)    Did it involve swelling of the face/tongue/throat, SOB, or low BP? Unknown Did it involve sudden or severe rash/hives, skin peeling, or any reaction on the inside of your mouth or nose? Unknown Did you need to seek medical attention at a hospital or doctor's office? Unknown When did it last happen?years  If all above answers are "NO", may proceed with cephalosporin use.   . Statins Other (See Comments)    elevated  LFT's    . Albuterol Swelling   Current Facility-Administered Medications on File Prior to Encounter  Medication Dose Route Frequency Provider Last Rate Last Admin  . 0.9 %  sodium chloride infusion   Intravenous Continuous Lockamy, Randi L, NP-C 20 mL/hr at 07/19/19 1641 Rate Verify at 07/19/19 1641   Current Outpatient Medications on File Prior to Encounter  Medication Sig Dispense Refill  . amLODipine (NORVASC) 10 MG tablet TAKE 1 TABLET BY MOUTH ONCE DAILY. 28 tablet 0  . aspirin EC 81 MG tablet Take 81 mg by mouth daily. Swallow whole.    . calcitRIOL (ROCALTROL) 0.25 MCG capsule Take 0.25 capsules by mouth.    . clindamycin (CLEOCIN) 900 MG/50ML IVPB Inject into the vein.    . cloNIDine (CATAPRES) 0.1 MG tablet Take 0.1 mg by mouth 2 (two) times daily.    . Continuous Blood Gluc Sensor (FREESTYLE LIBRE 14 DAY SENSOR) MISC 1 each by Does not apply route every 14 (fourteen) days. Change every 2 weeks 2 each 11  . DULoxetine (CYMBALTA) 60 MG capsule Take 60 mg by mouth daily.    Marland Kitchen ezetimibe (ZETIA) 10 MG tablet TAKE 1 TABLET BY MOUTH ONCE DAILY. 28 tablet 11  . furosemide (LASIX) 80 MG tablet Take 80 mg by mouth 2 (two) times daily.    Marland Kitchen HYDROcodone-acetaminophen (NORCO/VICODIN) 5-325 MG tablet Take 1 tablet by mouth every 6 (six) hours as needed for up to 5 days for moderate pain. 20 tablet 0  . ibuprofen (ADVIL) 800 MG tablet Take 1 tablet (800 mg total) by mouth every 8 (eight) hours as needed. 90 tablet 1  . Insulin Glargine, 2 Unit Dial, (TOUJEO MAX SOLOSTAR) 300 UNIT/ML SOPN Inject 50-56 Units into the skin See admin instructions. Take after dialysis 3 day a week. 3 pen 11  . Insulin Lispro (HUMALOG KWIKPEN) 200 UNIT/ML SOPN Inject 24-28 Units into the skin 3 (three) times daily before meals. 30 mL 5  . isosorbide mononitrate (IMDUR) 30 MG 24 hr tablet TAKE 1 TABLET BY MOUTH ONCE DAILY. (Patient taking differently: Take 30 mg by mouth daily. ) 28 tablet 11  . metoprolol succinate  (TOPROL-XL) 50 MG 24 hr tablet Take 1 tablet (50 mg total) by mouth daily. Take with or immediately following a meal. 90 tablet 3  . midodrine (PROAMATINE) 10 MG tablet Take 10 mg by mouth in the morning and at bedtime.    . ondansetron (ZOFRAN) 4 MG tablet Take 1 tablet (4 mg total) by mouth every 8 (eight) hours as needed for nausea or vomiting. 20 tablet 0  . pregabalin (LYRICA) 50 MG capsule Take 1 capsule (50 mg total) by mouth 3 (three) times daily. 90 capsule 2  . sertraline (ZOLOFT) 50 MG tablet TAKE 1 TABLET BY MOUTH ONCE DAILY. (Patient taking differently: Take 50 mg by mouth daily. ) 28 tablet 4  . torsemide (DEMADEX) 100 MG tablet Take 100 mg by mouth 2 (two) times  daily.    Marland Kitchen ULTICARE MINI PEN NEEDLES 31G X 6 MM MISC USE AS DIRECTED 100 each 0  . [name] 500 MG chewable tablet Chew 500 mg by mouth See admin instructions. Take 500 mg with each snack and meal    . zolpidem (AMBIEN) 5 MG tablet Take one tablet by mouth at night as needed for sleep. 30 tablet 0  . [DISCONTINUED] FLUoxetine (PROZAC) 10 MG capsule Take 10 mg by mouth daily.      . [DISCONTINUED] glipiZIDE (GLUCOTROL) 10 MG tablet Take 10 mg by mouth 2 (two) times daily before a meal.       Social History   Socioeconomic History  . Marital status: Married    Spouse name: larry   . Number of children: 2  . Years of education: 66  . Highest education level: 12th grade  Occupational History  . Occupation: retired   Tobacco Use  . Smoking status: Never Smoker  . Smokeless tobacco: Never Used  Vaping Use  . Vaping Use: Never used  Substance and Sexual Activity  . Alcohol use: No  . Drug use: No  . Sexual activity: Not Currently  Other Topics Concern  . Not on file  Social History Narrative   Lives alone with husband    Social Determinants of Health   Financial Resource Strain: Low Risk   . Difficulty of Paying Living Expenses: Not hard at all  Food Insecurity: No Food Insecurity  . Worried About Ship broker in the Last Year: Never true  . Ran Out of Food in the Last Year: Never true  Transportation Needs: No Transportation Needs  . Lack of Transportation (Medical): No  . Lack of Transportation (Non-Medical): No  Physical Activity: Insufficiently Active  . Days of Exercise per Week: 2 days  . Minutes of Exercise per Session: 10 min  Stress: No Stress Concern Present  . Feeling of Stress : Only a little  Social Connections: Moderately Integrated  . Frequency of Communication with Friends and Family: More than three times a week  . Frequency of Social Gatherings with Friends and Family: More than three times a week  . Attends Religious Services: More than 4 times per year  . Active Member of Clubs or Organizations: No  . Attends Archivist Meetings: Never  . Marital Status: Married  Human resources officer Violence: Not At Risk  . Fear of Current or Ex-Partner: No  . Emotionally Abused: No  . Physically Abused: No  . Sexually Abused: No   Family History  Problem Relation Age of Onset  . Hypertension Father   . Hypercholesterolemia Father   . Arthritis Father   . Hypertension Sister   . Hypercholesterolemia Sister   . Breast cancer Sister   . Hypertension Sister   . Colon cancer Neg Hx   . Colon polyps Neg Hx     OBJECTIVE:  Vitals:   10/23/19 1311 10/23/19 1313  BP:  (!) 162/74  Pulse:  81  Resp:  19  Temp:  97.9 F (36.6 C)  TempSrc:  Oral  SpO2:  95%  Weight: 194 lb 0.1 oz (88 kg)   Height: 5\' 4"  (1.626 m)      General appearance: alert; well-appearing, nontoxic; speaking in full sentences and tolerating own secretions HEENT: NCAT; Eyes: PERRL.  EOM grossly intact.Nose: nares patent without rhinorrhea, Throat: oropharynx clear, tonsils non erythematous or enlarged, uvula midline  Neck: supple without LAD Lungs: unlabored respirations, symmetrical air  entry; cough: absent; no respiratory distress; CTAB Heart: regular rate and rhythm.  Skin: warm and  dry Psychological: alert and cooperative; normal mood and affect   ASSESSMENT & PLAN:  1. Encounter for laboratory testing for COVID-19 virus   2. Encounter for preoperative screening laboratory testing for COVID-19 virus     COVID testing ordered.  It will take between 2-5 days for test results.  Someone will contact you regarding abnormal results.    In the meantime:  If you were to develop symptoms: You should remain isolated in your home for 10 days from symptom onset AND greater than 72 hours after symptoms resolution (absence of fever without the use of fever-reducing medication and improvement in respiratory symptoms), whichever is longer If you have had exposure: you should remain in quarantine for 7 days from exposure.  However, if symptoms develop you must self isolate and return for retesting Get plenty of rest and push fluids Follow up with PCP as needed Call or go to the ED if you have any new symptoms such as fever, cough, shortness of breath, chest tightness, chest pain, turning blue, changes in mental status, etc...   Reviewed expectations re: course of current medical issues. Questions answered. Outlined signs and symptoms indicating need for more acute intervention. Patient verbalized understanding. After Visit Summary given.         Lestine Box, PA-C 10/23/19 1340

## 2019-10-23 NOTE — Progress Notes (Signed)
Chief Complaint  Patient presents with  . Leg Pain    behind right knee painful into back of thigh x 3 weeks has leg weakness also     64 year old female presents with acute onset of pain behind the right knee and thigh right leg pain and weakness proximally with inability to ambulate  Patient planes of fatigue and overall weakness no fever  Past Medical History:  Diagnosis Date  . Acid reflux   . Anemia   . Arthritis   . Axillary masses    Soft tissue - status post excision  . Back pain   . COVID-19 virus infection 04/06/2019  . Depression   . End-stage renal disease ([place])    [facility]  in [place]  . Essential hypertension   . History of blood transfusion   . History of cardiac catheterization    Normal coronary arteries October 2020  . History of claustrophobia   . History of pneumonia 2019  . Hypoxia 04/03/2019  . Mixed hyperlipidemia   . Obesity   . Pancreatitis   . Peritoneal dialysis catheter in place [facility])   . Pneumonia due to COVID-19 virus 04/02/2019  . Sleep apnea    Noncompliant with CPAP  . Type 2 diabetes mellitus (HCC)     BP (!) 179/81   Pulse 80   Ht 5\' 4"  (1.626 m)   Wt 196 lb (88.9 kg)   BMI 33.64 kg/m   She is awake alert and oriented x3 mood and affect are normal  L5-S1 strength normal right leg L3 L2 weakness right lower extremity  Nontender spine  Tender posterior aspect right leg  X-ray knee negative for acute process  Recommend MRI to evaluate proximal weakness Patient had lumbar spine x-ray April 2021 showing spondylosis and degenerative disc disease L5-S1  Meds ordered this encounter  Medications  . HYDROcodone-acetaminophen (NORCO/VICODIN) 5-325 MG tablet    Sig: Take 1 tablet by mouth every 6 (six) hours as needed for up to 5 days for moderate pain.    Dispense:  20 tablet    Refill:  0

## 2019-10-23 NOTE — Progress Notes (Signed)
SHAWNTA SCHLEGEL     MRN: [id number]      DOB: 10-Dec-1955   HPI Ms. Cordner is here with a 3 week h/o right posterior knee pain and sometimes she feels as though she will fall. No aggravating factor, new for her. Called Ortho appt is in 3 weeks, will try to get her in sooner ROS Denies recent fever or chills. Denies sinus pressure, nasal congestion, ear pain or sore throat. Denies chest congestion, productive cough or wheezing. Denies chest pains, palpitations and leg swelling Denies abdominal pain, nausea, vomiting,diarrhea or constipation.   . Denies headaches, seizures, numbness, or tingling. Denies depression, anxiety or insomnia. Denies skin break down or rash.   PE BP (!) 150/70   Pulse 84   Temp 97.9 F (36.6 C) (Oral)   Resp 18   Ht 5\' 4"  (1.626 m)   Wt 196 lb (88.9 kg)   SpO2 95%   BMI 33.64 kg/m    Patient alert and oriented and in no cardiopulmonary distress.Pt in severe pain with markedly reduced mobility  HEENT: No facial asymmetry, EOMI,     Neck supple .  Chest: Clear to auscultation bilaterally.  CVS: S1, S2 no murmurs, no S3.Regular rate.  ABD: Soft non tender.   Ext: No edema  MS: decreased ROM spine,markedly reduced in right  Tender in posterior aspect right knee  Skin: Intact, no ulcerations or rash noted.  Psych: Good eye contact, normal affect. Memory intact not anxious or depressed appearing.  CNS: CN 2-12 intact, power,  normal throughout.no focal deficits noted.   Assessment & Plan  Knee pain, right 3 week posterior right knee pain worsening, unable to bend knee, rated at 9, unstable, urgent Ortho eval  Malignant hypertension Adequate;ly controlled, no change in management  Chronic diastolic CHF (congestive heart failure) (HCC) Currently stable , no decompensation at this time  Type 2 diabetes mellitus with diabetic neuropathy, with long-term current use of insulin [facility]) Ms. Zieske is reminded of the importance of commitment  to daily physical activity for 30 minutes or more, as able and the need to limit carbohydrate intake to 30 to 60 grams per meal to help with blood sugar control.   The need to take medication as prescribed, test blood sugar as directed, and to call between visits if there is a concern that blood sugar is uncontrolled is also discussed.   Ms. Ruddy is reminded of the importance of daily foot exam, annual eye examination, and good blood sugar, blood pressure and cholesterol control. Controlled and managed by Endo  Diabetic Labs Latest Ref Rng & Units 07/19/2019 07/17/2019 04/07/2019 04/06/2019 04/05/2019  HbA1c - - 7.8 - - -  Microalbumin Not estab mg/dL - - - - -  Micro/Creat Ratio <30 mcg/mg creat - - - - -  Chol 0 - 200 mg/dL - - - - -  HDL >40 mg/dL - - - - -  Calc LDL 0 - 99 mg/dL - - - - -  Triglycerides <150 mg/dL - - - - -  Creatinine 0.44 - 1.00 mg/dL 5.96(H) 8.1(A) 7.68(H) 5.66(H) 7.94(H)   BP/Weight 10/23/2019 10/23/2019 10/23/2019 10/13/2019 10/03/2019 09/20/2019 3/66/4403  Systolic BP 474 259 563 875 643 329 518  Diastolic BP 74 81 70 64 69 69 58  Wt. (Lbs) 194.01 196 196 190 197 197 220  BMI 33.3 33.64 33.64 32.61 30.85 33.81 37.76   Foot/eye exam completion dates Latest Ref Rng & Units 03/23/2019 12/20/2017  Eye Exam No  Retinopathy - -  Foot Form Completion - Done Done

## 2019-10-23 NOTE — ED Triage Notes (Signed)
Needs covid test for surgery on 10/30/19

## 2019-10-24 LAB — SARS-COV-2, NAA 2 DAY TAT

## 2019-10-24 LAB — NOVEL CORONAVIRUS, NAA: SARS-CoV-2, NAA: NOT DETECTED

## 2019-10-26 ENCOUNTER — Other Ambulatory Visit (HOSPITAL_COMMUNITY): Payer: 59

## 2019-10-26 ENCOUNTER — Other Ambulatory Visit: Payer: Self-pay | Admitting: Orthopedic Surgery

## 2019-10-26 ENCOUNTER — Other Ambulatory Visit: Payer: Self-pay | Admitting: Family Medicine

## 2019-10-26 DIAGNOSIS — M545 Low back pain, unspecified: Secondary | ICD-10-CM

## 2019-10-29 ENCOUNTER — Encounter: Payer: Self-pay | Admitting: Family Medicine

## 2019-10-29 NOTE — Assessment & Plan Note (Signed)
Kathryn Beck is reminded of the importance of commitment to daily physical activity for 30 minutes or more, as able and the need to limit carbohydrate intake to 30 to 60 grams per meal to help with blood sugar control.   The need to take medication as prescribed, test blood sugar as directed, and to call between visits if there is a concern that blood sugar is uncontrolled is also discussed.   Kathryn Beck is reminded of the importance of daily foot exam, annual eye examination, and good blood sugar, blood pressure and cholesterol control. Controlled and managed by Endo  Diabetic Labs Latest Ref Rng & Units 07/19/2019 07/17/2019 04/07/2019 04/06/2019 04/05/2019  HbA1c - - 7.8 - - -  Microalbumin Not estab mg/dL - - - - -  Micro/Creat Ratio <30 mcg/mg creat - - - - -  Chol 0 - 200 mg/dL - - - - -  HDL >40 mg/dL - - - - -  Calc LDL 0 - 99 mg/dL - - - - -  Triglycerides <150 mg/dL - - - - -  Creatinine 0.44 - 1.00 mg/dL 5.96(H) 8.1(A) 7.68(H) 5.66(H) 7.94(H)   BP/Weight 10/23/2019 10/23/2019 10/23/2019 10/13/2019 10/03/2019 09/20/2019 5/79/0383  Systolic BP 338 329 191 660 600 459 977  Diastolic BP 74 81 70 64 69 69 58  Wt. (Lbs) 194.01 196 196 190 197 197 220  BMI 33.3 33.64 33.64 32.61 30.85 33.81 37.76   Foot/eye exam completion dates Latest Ref Rng & Units 03/23/2019 12/20/2017  Eye Exam No Retinopathy - -  Foot Form Completion - Done Done

## 2019-10-29 NOTE — Assessment & Plan Note (Signed)
Adequate;ly controlled, no change in management

## 2019-10-29 NOTE — Assessment & Plan Note (Signed)
Currently stable , no decompensation at this time 

## 2019-11-02 ENCOUNTER — Ambulatory Visit (HOSPITAL_COMMUNITY): Payer: 59 | Admitting: Hematology

## 2019-11-08 ENCOUNTER — Ambulatory Visit (INDEPENDENT_AMBULATORY_CARE_PROVIDER_SITE_OTHER): Payer: 59 | Admitting: Orthopedic Surgery

## 2019-11-08 ENCOUNTER — Other Ambulatory Visit: Payer: Self-pay

## 2019-11-08 ENCOUNTER — Encounter: Payer: Self-pay | Admitting: Orthopedic Surgery

## 2019-11-08 VITALS — BP 118/71 | HR 101 | Ht 64.0 in

## 2019-11-08 DIAGNOSIS — M5410 Radiculopathy, site unspecified: Secondary | ICD-10-CM

## 2019-11-08 MED ORDER — HYDROCODONE-ACETAMINOPHEN 5-325 MG PO TABS: 1.0000 | ORAL_TABLET | Freq: Four times a day (QID) | ORAL | 0 refills | Status: DC | PRN

## 2019-11-08 NOTE — Patient Instructions (Signed)
MAKE APPT AFTER MRI

## 2019-11-08 NOTE — Progress Notes (Signed)
Chief Complaint  Patient presents with  . Knee Pain    Patient unable to stand on right leg, MRI is set for 60/46   65 year old female in the process of being worked up for weakness in her right lower extremity her MRI set for August 23  Not sure what happened may be the MRI got scheduled later than we thought but in any event I gave her some medicine for pain she should come back when her MRI is finished  Shouldn't be here make appt after MRI   Meds ordered this encounter  Medications  . HYDROcodone-acetaminophen (NORCO/VICODIN) 5-325 MG tablet    Sig: Take 1 tablet by mouth every 6 (six) hours as needed for moderate pain.    Dispense:  30 tablet    Refill:  0   Encounter Diagnosis  Name Primary?  . Radicular pain of right lower extremity Yes

## 2019-11-09 ENCOUNTER — Ambulatory Visit: Payer: 59 | Admitting: Family Medicine

## 2019-11-13 ENCOUNTER — Other Ambulatory Visit: Payer: Self-pay

## 2019-11-13 ENCOUNTER — Ambulatory Visit (HOSPITAL_COMMUNITY)
Admission: RE | Admit: 2019-11-13 | Discharge: 2019-11-13 | Disposition: A | Discharge status: Home / Self Care | Payer: 59 | Source: Ambulatory Visit | Attending: Orthopedic Surgery | Admitting: Orthopedic Surgery

## 2019-11-13 DIAGNOSIS — M5410 Radiculopathy, site unspecified: Secondary | ICD-10-CM | POA: Insufficient documentation

## 2019-11-16 ENCOUNTER — Ambulatory Visit: Payer: 59 | Admitting: Family Medicine

## 2019-11-23 ENCOUNTER — Ambulatory Visit (INDEPENDENT_AMBULATORY_CARE_PROVIDER_SITE_OTHER): Payer: 59 | Admitting: Orthopedic Surgery

## 2019-11-23 ENCOUNTER — Other Ambulatory Visit: Payer: Self-pay

## 2019-11-23 VITALS — BP 136/65 | HR 96

## 2019-11-23 DIAGNOSIS — R29898 Other symptoms and signs involving the musculoskeletal system: Secondary | ICD-10-CM | POA: Diagnosis not present

## 2019-11-23 NOTE — Progress Notes (Signed)
Chief Complaint  Patient presents with  . Back Pain    go over MRI 11/13/19    64 year old female with proximal weakness sent for MRI MRI was not very suggestive for any spinal stenosis she had another MRI in April saw Dr. Lorin Mercy had some L3-4 radiculopathy and stenosis but nothing that needed surgery  I discussed the MRI with her at length after reading it twice and despite her having mild disc bulges there is nothing to suggest or explain her proximal weakness  Recommend she see neurology for further work-up  Also note I reviewed her hip and knee x-rays and they did not show an explanation for her situation  T11-T12: Chronic disc desiccation and mild disc bulge or central disc protrusion. No stenosis.  T12-L1:  Negative.  L1-L2:  Negative.  L2-L3: Mild heterogeneity of the disc canal likely related to the L3 superior endplate Schmorl's node. Minimal circumferential disc bulge. No stenosis.  L3-L4: Mild foraminal and far lateral disc bulging appears stable along with mild posterior element hypertrophy. No significant stenosis.  L4-L5: Mild circumferential disc bulge. Mild endplate spurring. Mild posterior element hypertrophy. No spinal or lateral recess stenosis. Mild epidural lipomatosis at this level has increased. Borderline to mild L4 foraminal stenosis is stable.  L5-S1: Mostly sacralized. Vestigial L5-S1 disc space. No stenosis.  IMPRESSION: 1. 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 Correlation with radiographs is recommended prior to any operative intervention.  2. New 16 mm area of signal abnormality in the superior L3 body most compatible with a relatively large Schmorl's node (degenerative). No other acute osseous abnormality.  3. Otherwise stable and generally mild for age lumbar spine degeneration. No convincing neural impingement.   Electronically Signed   By: Genevie Ann M.D.   On:  11/13/2019 14:47

## 2019-12-12 ENCOUNTER — Other Ambulatory Visit: Payer: Self-pay | Admitting: Family Medicine

## 2019-12-13 ENCOUNTER — Ambulatory Visit: Payer: 59 | Admitting: Family Medicine

## 2019-12-14 ENCOUNTER — Other Ambulatory Visit: Payer: Self-pay | Admitting: Orthopedic Surgery

## 2019-12-14 DIAGNOSIS — M5410 Radiculopathy, site unspecified: Secondary | ICD-10-CM

## 2019-12-14 MED ORDER — HYDROCODONE-ACETAMINOPHEN 5-325 MG PO TABS: 1.0000 | ORAL_TABLET | Freq: Four times a day (QID) | ORAL | 0 refills | Status: DC | PRN

## 2019-12-14 NOTE — Telephone Encounter (Signed)
Patient requests refill of pain medication - relays she is scheduled for her referral appointment with Dr Merlene Laughter 12/20/19, as noted.  HYDROcodone-acetaminophen (NORCO/VICODIN) 5-325 MG tablet 30 tablet  -[facility], Linna Hoff

## 2019-12-15 ENCOUNTER — Other Ambulatory Visit: Payer: Self-pay | Admitting: Family Medicine

## 2020-01-01 ENCOUNTER — Ambulatory Visit: Payer: 59 | Admitting: Family Medicine

## 2020-01-01 ENCOUNTER — Encounter: Payer: Self-pay | Admitting: Internal Medicine

## 2020-01-09 ENCOUNTER — Other Ambulatory Visit: Payer: Self-pay | Admitting: Neurology

## 2020-01-09 DIAGNOSIS — G8311 Monoplegia of lower limb affecting right dominant side: Secondary | ICD-10-CM

## 2020-01-09 DIAGNOSIS — M4712 Other spondylosis with myelopathy, cervical region: Secondary | ICD-10-CM

## 2020-01-10 ENCOUNTER — Other Ambulatory Visit: Payer: Self-pay | Admitting: Family Medicine

## 2020-01-18 ENCOUNTER — Ambulatory Visit (HOSPITAL_COMMUNITY)
Admission: RE | Admit: 2020-01-18 | Discharge: 2020-01-18 | Disposition: A | Discharge status: Home / Self Care | Payer: 59 | Source: Ambulatory Visit | Attending: Neurology | Admitting: Neurology

## 2020-01-18 ENCOUNTER — Other Ambulatory Visit: Payer: Self-pay

## 2020-01-18 DIAGNOSIS — M4712 Other spondylosis with myelopathy, cervical region: Secondary | ICD-10-CM

## 2020-01-18 DIAGNOSIS — G8311 Monoplegia of lower limb affecting right dominant side: Secondary | ICD-10-CM | POA: Insufficient documentation

## 2020-01-24 ENCOUNTER — Encounter: Payer: 59 | Admitting: Internal Medicine

## 2020-01-24 ENCOUNTER — Other Ambulatory Visit: Payer: Self-pay

## 2020-02-20 ENCOUNTER — Telehealth: Payer: Self-pay | Admitting: Family Medicine

## 2020-02-20 ENCOUNTER — Other Ambulatory Visit: Payer: Self-pay | Admitting: Family Medicine

## 2020-02-20 NOTE — Telephone Encounter (Signed)
Copied Noted Sleeved 

## 2020-02-28 ENCOUNTER — Ambulatory Visit (INDEPENDENT_AMBULATORY_CARE_PROVIDER_SITE_OTHER): Payer: 59 | Admitting: Family Medicine

## 2020-02-28 ENCOUNTER — Other Ambulatory Visit: Payer: Self-pay

## 2020-02-28 ENCOUNTER — Encounter: Payer: Self-pay | Admitting: Family Medicine

## 2020-02-28 VITALS — BP 150/67 | HR 81 | Resp 16 | Ht 64.0 in | Wt 190.0 lb

## 2020-02-28 DIAGNOSIS — M79601 Pain in right arm: Secondary | ICD-10-CM

## 2020-02-28 DIAGNOSIS — E1140 Type 2 diabetes mellitus with diabetic neuropathy, unspecified: Secondary | ICD-10-CM

## 2020-02-28 DIAGNOSIS — M79602 Pain in left arm: Secondary | ICD-10-CM

## 2020-02-28 DIAGNOSIS — I152 Hypertension secondary to endocrine disorders: Secondary | ICD-10-CM

## 2020-02-28 DIAGNOSIS — Z794 Long term (current) use of insulin: Secondary | ICD-10-CM

## 2020-02-28 DIAGNOSIS — R29898 Other symptoms and signs involving the musculoskeletal system: Secondary | ICD-10-CM

## 2020-02-28 DIAGNOSIS — E782 Mixed hyperlipidemia: Secondary | ICD-10-CM

## 2020-02-28 DIAGNOSIS — R937 Abnormal findings on diagnostic imaging of other parts of musculoskeletal system: Secondary | ICD-10-CM | POA: Diagnosis not present

## 2020-02-28 DIAGNOSIS — E1159 Type 2 diabetes mellitus with other circulatory complications: Secondary | ICD-10-CM | POA: Diagnosis not present

## 2020-02-28 LAB — POCT GLYCOSYLATED HEMOGLOBIN (HGB A1C): HbA1c, POC (controlled diabetic range): 7.2 % — AB (ref 0.0–7.0)

## 2020-02-28 NOTE — Assessment & Plan Note (Signed)
Refer pT x 6 weeks

## 2020-02-28 NOTE — Patient Instructions (Addendum)
F/u in office with MD I 4 months, call if you need me sooner  Please schedule March 5 mammogram at checkout Please get eye exam form [place] and document done in [phone] ? February microalb if able , if not send home with cup please  GlycohB in office today  You are referred to PT twice weekly for 6 weeks for Right leg weakness (Nurse  please enter) You are referred to neurosurgery re neck pain with upper and lower extremity pain and weakness  Best for 2022!  Thanks for choosing [facility], we consider it a privelige to serve you.

## 2020-02-29 NOTE — Progress Notes (Signed)
Entered by error

## 2020-03-04 ENCOUNTER — Encounter: Payer: Self-pay | Admitting: Family Medicine

## 2020-03-04 DIAGNOSIS — R937 Abnormal findings on diagnostic imaging of other parts of musculoskeletal system: Secondary | ICD-10-CM

## 2020-03-04 HISTORY — DX: Abnormal findings on diagnostic imaging of other parts of musculoskeletal system: R93.7

## 2020-03-04 NOTE — Assessment & Plan Note (Signed)
Elevated at visit but pt has help medication as she will have dialysis DASH diet and commitment to daily physical activity for a minimum of 30 minutes discussed and encouraged, as a part of hypertension management. The importance of attaining a healthy weight is also discussed.  BP/Weight 02/28/2020 11/23/2019 11/08/2019 10/23/2019 10/23/2019 10/23/2019 2/32/0094  Systolic BP 179 199 579 009 200 415 930  Diastolic BP 67 65 71 74 81 70 64  Wt. (Lbs) 190.04 - - 194.01 196 196 190  BMI 32.62 - 33.3 33.3 33.64 33.64 32.61

## 2020-03-04 NOTE — Assessment & Plan Note (Signed)
Hyperlipidemia:Low fat diet discussed and encouraged.   Lipid Panel  Lab Results  Component Value Date   CHOL 199 03/08/2018   HDL 37 (L) 03/08/2018   LDLCALC 114 (H) 03/08/2018   LDLDIRECT 107 05/19/2016   TRIG 242 (H) 03/08/2018   CHOLHDL 5.4 03/08/2018   Updated lab needed at/ before next visit.

## 2020-03-04 NOTE — Assessment & Plan Note (Signed)
Abn MRI with rLE weakness and arm pain, neurosurgery to eval

## 2020-03-04 NOTE — Progress Notes (Signed)
Kathryn Beck     MRN: [id number]      DOB: August 23, 1955   HPI Kathryn Beck is here for follow up and re-evaluation of chronic medical conditions, medication management and review of any available recent lab and radiology data.  Preventive health is updated, specifically  Cancer screening and Immunization.   C/o right upper and lower extremity weakness, was recently dx with CVa on imaging, not acute however, will benefit from POT and needs neurosurgery eval The PT denies any adverse reactions to current medications since the last visit.  Denies polyuria, polydipsia, blurred vision , or hypoglycemic episodes.   ROS Denies recent fever or chills. Denies sinus pressure, nasal congestion, ear pain or sore throat. Denies chest congestion, productive cough or wheezing. Denies chest pains, palpitations and leg swelling Denies abdominal pain, nausea, vomiting,diarrhea or constipation.   Denies dysuria, frequency, hesitancy or incontinence.  Denies depression, anxiety or insomnia. Denies skin break down or rash.   PE  BP (!) 150/67   Pulse 81   Resp 16   Ht 5\' 4"  (1.626 m)   Wt 190 lb 0.6 oz (86.2 kg)   SpO2 99%   BMI 32.62 kg/m   Patient alert and oriented and in no cardiopulmonary distress.  HEENT: No facial asymmetry, EOMI,     Neck decreased ROM .  Chest: Clear to auscultation bilaterally.  CVS: S1, S2 no murmurs, no S3.Regular rate.  ABD: Soft non tender.   Ext: No edema  HQ:PRFFMBWGY  ROM spine,adequate in  shoulders, hips and knees.  Skin: Intact, no ulcerations or rash noted.  Psych: Good eye contact, normal affect. Memory intact not anxious or depressed appearing.  CNS: CN 2-12 intact,grade 4 right  lower ext power, reduced tone, sensation intact Assessment & Plan  Right leg weakness Refer pT x 6 weeks  Abnormal MRI, cervical spine Abn MRI with rLE weakness and arm pain, neurosurgery to eval  Hypertension associated with diabetes ([place]) Elevated at visit  but pt has help medication as she will have dialysis DASH diet and commitment to daily physical activity for a minimum of 30 minutes discussed and encouraged, as a part of hypertension management. The importance of attaining a healthy weight is also discussed.  BP/Weight 02/28/2020 11/23/2019 11/08/2019 10/23/2019 10/23/2019 10/23/2019 6/59/9357  Systolic BP 017 793 903 009 233 007 622  Diastolic BP 67 65 71 74 81 70 64  Wt. (Lbs) 190.04 - - 194.01 196 196 190  BMI 32.62 - 33.3 33.3 33.64 33.64 32.61       Type 2 diabetes mellitus with diabetic neuropathy, with long-term current use of insulin (HCC) Controlled, no change in medication Kathryn Beck is reminded of the importance of commitment to daily physical activity for 30 minutes or more, as able and the need to limit carbohydrate intake to 30 to 60 grams per meal to help with blood sugar control.   The need to take medication as prescribed, test blood sugar as directed, and to call between visits if there is a concern that blood sugar is uncontrolled is also discussed.   Kathryn Beck is reminded of the importance of daily foot exam, annual eye examination, and good blood sugar, blood pressure and cholesterol control.  Diabetic Labs Latest Ref Rng & Units 02/28/2020 07/19/2019 07/17/2019 04/07/2019 04/06/2019  HbA1c 0.0 - 7.0 % 7.2(A) - 7.8 - -  Microalbumin Not estab mg/dL - - - - -  Micro/Creat Ratio <30 mcg/mg creat - - - - -  Chol 0 - 200 mg/dL - - - - -  HDL >40 mg/dL - - - - -  Calc LDL 0 - 99 mg/dL - - - - -  Triglycerides <150 mg/dL - - - - -  Creatinine 0.44 - 1.00 mg/dL - 5.96(H) 8.1(A) 7.68(H) 5.66(H)   BP/Weight 02/28/2020 11/23/2019 11/08/2019 10/23/2019 10/23/2019 10/23/2019 2/42/6834  Systolic BP 196 222 979 892 119 417 408  Diastolic BP 67 65 71 74 81 70 64  Wt. (Lbs) 190.04 - - 194.01 196 196 190  BMI 32.62 - 33.3 33.3 33.64 33.64 32.61   Foot/eye exam completion dates Latest Ref Rng & Units 02/28/2020 03/23/2019  Eye Exam No  Retinopathy - -  Foot Form Completion - Done Done        Mixed hyperlipidemia Hyperlipidemia:Low fat diet discussed and encouraged.   Lipid Panel  Lab Results  Component Value Date   CHOL 199 03/08/2018   HDL 37 (L) 03/08/2018   LDLCALC 114 (H) 03/08/2018   LDLDIRECT 107 05/19/2016   TRIG 242 (H) 03/08/2018   CHOLHDL 5.4 03/08/2018   Updated lab needed at/ before next visit.     Morbid obesity ([place])  Patient re-educated about  the importance of commitment to a  minimum of 150 minutes of exercise per week as able.  The importance of healthy food choices with portion control discussed, as well as eating regularly and within a 12 hour window most days. The need to choose "clean , green" food 50 to 75% of the time is discussed, as well as to make water the primary drink and set a goal of 64 ounces water daily.    Weight /BMI 02/28/2020 11/08/2019 10/23/2019  WEIGHT 190 lb 0.6 oz - 194 lb 0.1 oz  HEIGHT 5\' 4"  5\' 4"  5\' 4"   BMI 32.62 kg/m2 33.3 kg/m2 33.3 kg/m2

## 2020-03-04 NOTE — Assessment & Plan Note (Signed)
Controlled, no change in medication Kathryn Beck is reminded of the importance of commitment to daily physical activity for 30 minutes or more, as able and the need to limit carbohydrate intake to 30 to 60 grams per meal to help with blood sugar control.   The need to take medication as prescribed, test blood sugar as directed, and to call between visits if there is a concern that blood sugar is uncontrolled is also discussed.   Kathryn Beck is reminded of the importance of daily foot exam, annual eye examination, and good blood sugar, blood pressure and cholesterol control.  Diabetic Labs Latest Ref Rng & Units 02/28/2020 07/19/2019 07/17/2019 04/07/2019 04/06/2019  HbA1c 0.0 - 7.0 % 7.2(A) - 7.8 - -  Microalbumin Not estab mg/dL - - - - -  Micro/Creat Ratio <30 mcg/mg creat - - - - -  Chol 0 - 200 mg/dL - - - - -  HDL >40 mg/dL - - - - -  Calc LDL 0 - 99 mg/dL - - - - -  Triglycerides <150 mg/dL - - - - -  Creatinine 0.44 - 1.00 mg/dL - 5.96(H) 8.1(A) 7.68(H) 5.66(H)   BP/Weight 02/28/2020 11/23/2019 11/08/2019 10/23/2019 10/23/2019 10/23/2019 12/15/9322  Systolic BP 199 144 458 483 507 573 225  Diastolic BP 67 65 71 74 81 70 64  Wt. (Lbs) 190.04 - - 194.01 196 196 190  BMI 32.62 - 33.3 33.3 33.64 33.64 32.61   Foot/eye exam completion dates Latest Ref Rng & Units 02/28/2020 03/23/2019  Eye Exam No Retinopathy - -  Foot Form Completion - Done Done

## 2020-03-04 NOTE — Assessment & Plan Note (Signed)
  Patient re-educated about  the importance of commitment to a  minimum of 150 minutes of exercise per week as able.  The importance of healthy food choices with portion control discussed, as well as eating regularly and within a 12 hour window most days. The need to choose "clean , green" food 50 to 75% of the time is discussed, as well as to make water the primary drink and set a goal of 64 ounces water daily.    Weight /BMI 02/28/2020 11/08/2019 10/23/2019  WEIGHT 190 lb 0.6 oz - 194 lb 0.1 oz  HEIGHT 5\' 4"  5\' 4"  5\' 4"   BMI 32.62 kg/m2 33.3 kg/m2 33.3 kg/m2

## 2020-03-07 ENCOUNTER — Other Ambulatory Visit: Payer: Self-pay | Admitting: Family Medicine

## 2020-03-07 ENCOUNTER — Other Ambulatory Visit: Payer: Self-pay | Admitting: Orthopedic Surgery

## 2020-03-07 DIAGNOSIS — M545 Low back pain, unspecified: Secondary | ICD-10-CM

## 2020-04-04 ENCOUNTER — Other Ambulatory Visit: Payer: Self-pay | Admitting: Family Medicine

## 2020-05-02 ENCOUNTER — Telehealth: Payer: 59 | Admitting: Family Medicine

## 2020-05-02 ENCOUNTER — Other Ambulatory Visit: Payer: Self-pay

## 2020-05-03 ENCOUNTER — Other Ambulatory Visit: Payer: Self-pay | Admitting: Family Medicine

## 2020-05-06 ENCOUNTER — Telehealth: Payer: 59 | Admitting: Family Medicine

## 2020-05-06 ENCOUNTER — Telehealth: Payer: 59 | Admitting: Nurse Practitioner

## 2020-05-20 ENCOUNTER — Other Ambulatory Visit: Payer: Self-pay | Admitting: Family Medicine

## 2020-05-22 DIAGNOSIS — N2581 Secondary hyperparathyroidism of renal origin: Secondary | ICD-10-CM | POA: Diagnosis not present

## 2020-05-22 DIAGNOSIS — Z992 Dependence on renal dialysis: Secondary | ICD-10-CM | POA: Diagnosis not present

## 2020-05-22 DIAGNOSIS — D509 Iron deficiency anemia, unspecified: Secondary | ICD-10-CM | POA: Diagnosis not present

## 2020-05-22 DIAGNOSIS — D631 Anemia in chronic kidney disease: Secondary | ICD-10-CM | POA: Diagnosis not present

## 2020-05-22 DIAGNOSIS — N186 End stage renal disease: Secondary | ICD-10-CM | POA: Diagnosis not present

## 2020-05-24 DIAGNOSIS — D509 Iron deficiency anemia, unspecified: Secondary | ICD-10-CM | POA: Diagnosis not present

## 2020-05-24 DIAGNOSIS — Z992 Dependence on renal dialysis: Secondary | ICD-10-CM | POA: Diagnosis not present

## 2020-05-24 DIAGNOSIS — N186 End stage renal disease: Secondary | ICD-10-CM | POA: Diagnosis not present

## 2020-05-24 DIAGNOSIS — D631 Anemia in chronic kidney disease: Secondary | ICD-10-CM | POA: Diagnosis not present

## 2020-05-24 DIAGNOSIS — N2581 Secondary hyperparathyroidism of renal origin: Secondary | ICD-10-CM | POA: Diagnosis not present

## 2020-05-25 ENCOUNTER — Other Ambulatory Visit: Payer: Self-pay | Admitting: Family Medicine

## 2020-05-27 ENCOUNTER — Telehealth: Payer: Self-pay

## 2020-05-27 ENCOUNTER — Other Ambulatory Visit: Payer: Self-pay | Admitting: Family Medicine

## 2020-05-27 DIAGNOSIS — D631 Anemia in chronic kidney disease: Secondary | ICD-10-CM | POA: Diagnosis not present

## 2020-05-27 DIAGNOSIS — D509 Iron deficiency anemia, unspecified: Secondary | ICD-10-CM | POA: Diagnosis not present

## 2020-05-27 DIAGNOSIS — Z992 Dependence on renal dialysis: Secondary | ICD-10-CM | POA: Diagnosis not present

## 2020-05-27 DIAGNOSIS — N186 End stage renal disease: Secondary | ICD-10-CM | POA: Diagnosis not present

## 2020-05-27 DIAGNOSIS — N2581 Secondary hyperparathyroidism of renal origin: Secondary | ICD-10-CM | POA: Diagnosis not present

## 2020-05-27 MED ORDER — ZOLPIDEM TARTRATE 5 MG PO TABS
5.0000 mg | ORAL_TABLET | Freq: Every evening | ORAL | 0 refills | Status: DC | PRN
Start: 2020-05-27 — End: 2020-07-08

## 2020-05-27 NOTE — Telephone Encounter (Signed)
Limited supply sent to pharmacy as not on regular med list, will address furhter at next visit

## 2020-05-27 NOTE — Telephone Encounter (Signed)
Requesting refill of zolpidem be sent to pharmacy

## 2020-05-29 DIAGNOSIS — N2581 Secondary hyperparathyroidism of renal origin: Secondary | ICD-10-CM | POA: Diagnosis not present

## 2020-05-29 DIAGNOSIS — N186 End stage renal disease: Secondary | ICD-10-CM | POA: Diagnosis not present

## 2020-05-29 DIAGNOSIS — D631 Anemia in chronic kidney disease: Secondary | ICD-10-CM | POA: Diagnosis not present

## 2020-05-29 DIAGNOSIS — Z992 Dependence on renal dialysis: Secondary | ICD-10-CM | POA: Diagnosis not present

## 2020-05-29 DIAGNOSIS — D509 Iron deficiency anemia, unspecified: Secondary | ICD-10-CM | POA: Diagnosis not present

## 2020-05-31 DIAGNOSIS — N186 End stage renal disease: Secondary | ICD-10-CM | POA: Diagnosis not present

## 2020-05-31 DIAGNOSIS — Z992 Dependence on renal dialysis: Secondary | ICD-10-CM | POA: Diagnosis not present

## 2020-05-31 DIAGNOSIS — N2581 Secondary hyperparathyroidism of renal origin: Secondary | ICD-10-CM | POA: Diagnosis not present

## 2020-05-31 DIAGNOSIS — D631 Anemia in chronic kidney disease: Secondary | ICD-10-CM | POA: Diagnosis not present

## 2020-05-31 DIAGNOSIS — D509 Iron deficiency anemia, unspecified: Secondary | ICD-10-CM | POA: Diagnosis not present

## 2020-06-03 DIAGNOSIS — Z992 Dependence on renal dialysis: Secondary | ICD-10-CM | POA: Diagnosis not present

## 2020-06-03 DIAGNOSIS — N2581 Secondary hyperparathyroidism of renal origin: Secondary | ICD-10-CM | POA: Diagnosis not present

## 2020-06-03 DIAGNOSIS — D509 Iron deficiency anemia, unspecified: Secondary | ICD-10-CM | POA: Diagnosis not present

## 2020-06-03 DIAGNOSIS — D631 Anemia in chronic kidney disease: Secondary | ICD-10-CM | POA: Diagnosis not present

## 2020-06-03 DIAGNOSIS — N186 End stage renal disease: Secondary | ICD-10-CM | POA: Diagnosis not present

## 2020-06-05 DIAGNOSIS — N2581 Secondary hyperparathyroidism of renal origin: Secondary | ICD-10-CM | POA: Diagnosis not present

## 2020-06-05 DIAGNOSIS — D631 Anemia in chronic kidney disease: Secondary | ICD-10-CM | POA: Diagnosis not present

## 2020-06-05 DIAGNOSIS — D509 Iron deficiency anemia, unspecified: Secondary | ICD-10-CM | POA: Diagnosis not present

## 2020-06-05 DIAGNOSIS — Z992 Dependence on renal dialysis: Secondary | ICD-10-CM | POA: Diagnosis not present

## 2020-06-05 DIAGNOSIS — N186 End stage renal disease: Secondary | ICD-10-CM | POA: Diagnosis not present

## 2020-06-07 DIAGNOSIS — D509 Iron deficiency anemia, unspecified: Secondary | ICD-10-CM | POA: Diagnosis not present

## 2020-06-07 DIAGNOSIS — N2581 Secondary hyperparathyroidism of renal origin: Secondary | ICD-10-CM | POA: Diagnosis not present

## 2020-06-07 DIAGNOSIS — D631 Anemia in chronic kidney disease: Secondary | ICD-10-CM | POA: Diagnosis not present

## 2020-06-07 DIAGNOSIS — N186 End stage renal disease: Secondary | ICD-10-CM | POA: Diagnosis not present

## 2020-06-07 DIAGNOSIS — Z992 Dependence on renal dialysis: Secondary | ICD-10-CM | POA: Diagnosis not present

## 2020-06-10 DIAGNOSIS — D509 Iron deficiency anemia, unspecified: Secondary | ICD-10-CM | POA: Diagnosis not present

## 2020-06-10 DIAGNOSIS — Z992 Dependence on renal dialysis: Secondary | ICD-10-CM | POA: Diagnosis not present

## 2020-06-10 DIAGNOSIS — N186 End stage renal disease: Secondary | ICD-10-CM | POA: Diagnosis not present

## 2020-06-10 DIAGNOSIS — D631 Anemia in chronic kidney disease: Secondary | ICD-10-CM | POA: Diagnosis not present

## 2020-06-10 DIAGNOSIS — N2581 Secondary hyperparathyroidism of renal origin: Secondary | ICD-10-CM | POA: Diagnosis not present

## 2020-06-12 DIAGNOSIS — D509 Iron deficiency anemia, unspecified: Secondary | ICD-10-CM | POA: Diagnosis not present

## 2020-06-12 DIAGNOSIS — N2581 Secondary hyperparathyroidism of renal origin: Secondary | ICD-10-CM | POA: Diagnosis not present

## 2020-06-12 DIAGNOSIS — D631 Anemia in chronic kidney disease: Secondary | ICD-10-CM | POA: Diagnosis not present

## 2020-06-12 DIAGNOSIS — Z992 Dependence on renal dialysis: Secondary | ICD-10-CM | POA: Diagnosis not present

## 2020-06-12 DIAGNOSIS — N186 End stage renal disease: Secondary | ICD-10-CM | POA: Diagnosis not present

## 2020-06-14 DIAGNOSIS — N186 End stage renal disease: Secondary | ICD-10-CM | POA: Diagnosis not present

## 2020-06-14 DIAGNOSIS — Z992 Dependence on renal dialysis: Secondary | ICD-10-CM | POA: Diagnosis not present

## 2020-06-14 DIAGNOSIS — D509 Iron deficiency anemia, unspecified: Secondary | ICD-10-CM | POA: Diagnosis not present

## 2020-06-14 DIAGNOSIS — N2581 Secondary hyperparathyroidism of renal origin: Secondary | ICD-10-CM | POA: Diagnosis not present

## 2020-06-14 DIAGNOSIS — D631 Anemia in chronic kidney disease: Secondary | ICD-10-CM | POA: Diagnosis not present

## 2020-06-17 DIAGNOSIS — D631 Anemia in chronic kidney disease: Secondary | ICD-10-CM | POA: Diagnosis not present

## 2020-06-17 DIAGNOSIS — D509 Iron deficiency anemia, unspecified: Secondary | ICD-10-CM | POA: Diagnosis not present

## 2020-06-17 DIAGNOSIS — N186 End stage renal disease: Secondary | ICD-10-CM | POA: Diagnosis not present

## 2020-06-17 DIAGNOSIS — N2581 Secondary hyperparathyroidism of renal origin: Secondary | ICD-10-CM | POA: Diagnosis not present

## 2020-06-17 DIAGNOSIS — Z992 Dependence on renal dialysis: Secondary | ICD-10-CM | POA: Diagnosis not present

## 2020-06-18 ENCOUNTER — Other Ambulatory Visit (HOSPITAL_COMMUNITY)
Admission: RE | Admit: 2020-06-18 | Discharge: 2020-06-18 | Disposition: A | Discharge status: Home / Self Care | Payer: 59 | Source: Ambulatory Visit | Attending: Internal Medicine | Admitting: Internal Medicine

## 2020-06-18 ENCOUNTER — Other Ambulatory Visit: Payer: Self-pay

## 2020-06-18 ENCOUNTER — Ambulatory Visit (INDEPENDENT_AMBULATORY_CARE_PROVIDER_SITE_OTHER): Payer: 59 | Admitting: Internal Medicine

## 2020-06-18 ENCOUNTER — Encounter: Payer: Self-pay | Admitting: Internal Medicine

## 2020-06-18 VITALS — BP 185/66 | HR 93 | Resp 18 | Ht 64.0 in | Wt 193.4 lb

## 2020-06-18 DIAGNOSIS — B379 Candidiasis, unspecified: Secondary | ICD-10-CM

## 2020-06-18 DIAGNOSIS — Z794 Long term (current) use of insulin: Secondary | ICD-10-CM

## 2020-06-18 DIAGNOSIS — L309 Dermatitis, unspecified: Secondary | ICD-10-CM | POA: Diagnosis not present

## 2020-06-18 DIAGNOSIS — H6690 Otitis media, unspecified, unspecified ear: Secondary | ICD-10-CM | POA: Diagnosis not present

## 2020-06-18 DIAGNOSIS — B3731 Acute candidiasis of vulva and vagina: Secondary | ICD-10-CM

## 2020-06-18 DIAGNOSIS — E1140 Type 2 diabetes mellitus with diabetic neuropathy, unspecified: Secondary | ICD-10-CM | POA: Diagnosis not present

## 2020-06-18 DIAGNOSIS — B373 Candidiasis of vulva and vagina: Secondary | ICD-10-CM

## 2020-06-18 LAB — POCT GLYCOSYLATED HEMOGLOBIN (HGB A1C): HbA1c, POC (controlled diabetic range): 8.4 % — AB (ref 0.0–7.0)

## 2020-06-18 MED ORDER — TRIAMCINOLONE ACETONIDE 0.1 % EX CREA: 1.0000 "application " | TOPICAL_CREAM | Freq: Two times a day (BID) | CUTANEOUS | 0 refills | Status: DC

## 2020-06-18 MED ORDER — FLUCONAZOLE 150 MG PO TABS: 150.0000 mg | ORAL_TABLET | Freq: Once | ORAL | 0 refills | Status: AC

## 2020-06-18 NOTE — Progress Notes (Signed)
Acute Office Visit  Subjective:    Patient ID: Kathryn Beck, female    DOB: 1955-05-17, 65 y.o.   MRN: [id number]  Chief Complaint  Patient presents with  . Acute Visit    Pt has had ear ache for about 2 months and also was taking penicllin for tooth infection and now feels like she has yeast infection. Is itching.     HPI Patient is in today for evaluation of b/l ear pain for the last 2 months. Ear pain is constant, dull and is non-radiating. She denies any ear discharge. She recently had dental procedure done on right side. Of note, she has had b/l ear tubes placed in the past and used to see Dr Benjamine Mola for it. She has not seen ENT specialist for last 2 years.  She also has been having perineal area itching and thick vaginal discharge intermittently since she took Penicillin for dental infection.  Past Medical History:  Diagnosis Date  . Acid reflux   . Anemia   . Arthritis   . Axillary masses    Soft tissue - status post excision  . Back pain   . COVID-19 virus infection 04/06/2019  . Depression   . End-stage renal disease ([place])    [facility]  in [place]  . Essential hypertension   . History of blood transfusion   . History of cardiac catheterization    Normal coronary arteries October 2020  . History of claustrophobia   . History of pneumonia 2019  . Hypoxia 04/03/2019  . Mixed hyperlipidemia   . Obesity   . Pancreatitis   . Peritoneal dialysis catheter in place [facility])   . Pneumonia due to COVID-19 virus 04/02/2019  . Sleep apnea    Noncompliant with CPAP  . Type 2 diabetes mellitus ([place])     Past Surgical History:  Procedure Laterality Date  . ABDOMINAL HYSTERECTOMY    . AV FISTULA PLACEMENT Left 09/02/2017   Procedure: creation of left arm ARTERIOVENOUS (AV) FISTULA;  Surgeon: Serafina Mitchell, MD;  Location: [facility] OR;  Service: Vascular;  Laterality: Left;  . COLONOSCOPY  2008   Dr. Oneida Alar: normal   . COLONOSCOPY N/A 12/18/2016   Dr. Oneida Alar: multiple tubular  adenomas, internal hemorrhoids. Surveillance in 3 years   . ESOPHAGEAL DILATION N/A 10/13/2015   Procedure: ESOPHAGEAL DILATION;  Surgeon: Rogene Houston, MD;  [facility];  Service: Endoscopy;  Laterality: N/A;  . ESOPHAGOGASTRODUODENOSCOPY N/A 10/13/2015   Dr. Laural Golden: chronic gastritis on path, no H.pylori. Empiric dilation   . ESOPHAGOGASTRODUODENOSCOPY N/A 12/18/2016   Dr. Oneida Alar: mild gastritis. BRAVO study revealed uncontrolled GERD. Dysphagia secondary to uncontrolled reflux  . FOOT SURGERY Bilateral    "nerve"    . LEFT HEART CATH AND CORONARY ANGIOGRAPHY N/A 12/29/2018   Procedure: LEFT HEART CATH AND CORONARY ANGIOGRAPHY;  Surgeon: Jettie Booze, MD;  Location: [place] [facility] LAB;  Service: Cardiovascular;  Laterality: N/A;  . LUNG BIOPSY    . MASS EXCISION Right 01/09/2013   Procedure: EXCISION OF NEOPLASM OF RIGHT  AXILLA  AND EXCISION OF NEOPLASM OF LEFT AXILLA;  Surgeon: Jamesetta So, MD;  Location: [facility] ORS;  Service: General;  Laterality: Right;  procedure end @ 08:23  . MYRINGOTOMY WITH TUBE PLACEMENT Bilateral 04/28/2017   Procedure: BILATERAL MYRINGOTOMY WITH TUBE PLACEMENT;  Surgeon: Leta Baptist, MD;  Location: [place];  Service: ENT;  Laterality: Bilateral;  . REVISION OF ARTERIOVENOUS GORETEX GRAFT Left 05/04/2018   Procedure:  TRANSPOSITION OF CEPHALIC VEIN ARTERIOVENOUS FISTULA LEFT ARM;  Surgeon: Rosetta Posner, MD;  Location: [place];  Service: Vascular;  Laterality: Left;  . SAVORY DILATION N/A 12/18/2016   Procedure: SAVORY DILATION;  Surgeon: Danie Binder, MD;  [facility];  Service: Endoscopy;  Laterality: N/A;    Family History  Problem Relation Age of Onset  . Hypertension Father   . Hypercholesterolemia Father   . Arthritis Father   . Hypertension Sister   . Hypercholesterolemia Sister   . Breast cancer Sister   . Hypertension Sister   . Colon cancer Neg Hx   . Colon polyps Neg Hx     Social History   Socioeconomic History  .  Marital status: Married    Spouse name: larry   . Number of children: 2  . Years of education: 37  . Highest education level: 12th grade  Occupational History  . Occupation: retired   Tobacco Use  . Smoking status: Never Smoker  . Smokeless tobacco: Never Used  Vaping Use  . Vaping Use: Never used  Substance and Sexual Activity  . Alcohol use: No  . Drug use: No  . Sexual activity: Not Currently  Other Topics Concern  . Not on file  Social History Narrative   Lives alone with husband    Social Determinants of Health   Financial Resource Strain: Low Risk   . Difficulty of Paying Living Expenses: Not hard at all  Food Insecurity: No Food Insecurity  . Worried About Charity fundraiser in the Last Year: Never true  . Ran Out of Food in the Last Year: Never true  Transportation Needs: No Transportation Needs  . Lack of Transportation (Medical): No  . Lack of Transportation (Non-Medical): No  Physical Activity: Insufficiently Active  . Days of Exercise per Week: 2 days  . Minutes of Exercise per Session: 10 min  Stress: No Stress Concern Present  . Feeling of Stress : Only a little  Social Connections: Moderately Integrated  . Frequency of Communication with Friends and Family: More than three times a week  . Frequency of Social Gatherings with Friends and Family: More than three times a week  . Attends Religious Services: More than 4 times per year  . Active Member of Clubs or Organizations: No  . Attends Archivist Meetings: Never  . Marital Status: Married  Human resources officer Violence: Not At Risk  . Fear of Current or Ex-Partner: No  . Emotionally Abused: No  . Physically Abused: No  . Sexually Abused: No    Outpatient Medications Prior to Visit  Medication Sig Dispense Refill  . amLODipine (NORVASC) 5 MG tablet TAKE 1 TABLET BY MOUTH ONCE DAILY. 28 tablet 11  . aspirin EC 81 MG tablet Take 81 mg by mouth daily. Swallow whole.    . calcitRIOL (ROCALTROL)  0.25 MCG capsule Take 0.25 capsules by mouth.    . Continuous Blood Gluc Sensor (FREESTYLE LIBRE 14 DAY SENSOR) MISC 1 each by Does not apply route every 14 (fourteen) days. Change every 2 weeks 2 each 11  . cyclobenzaprine (FLEXERIL) 5 MG tablet TAKE ONE TABLET BY MOUTH AT BEDTIME AS NEEDED FOR MUSCLE SPASMS. 30 tablet 0  . DULoxetine (CYMBALTA) 60 MG capsule TAKE (1) CAPSULE BY MOUTH ONCE DAILY. 28 capsule 11  . ezetimibe (ZETIA) 10 MG tablet TAKE 1 TABLET BY MOUTH ONCE DAILY. 28 tablet 11  . HYDROcodone-acetaminophen (NORCO/VICODIN) 5-325 MG tablet Take 1 tablet by  mouth every 6 (six) hours as needed for moderate pain. 30 tablet 0  . Insulin Glargine, 2 Unit Dial, (TOUJEO MAX SOLOSTAR) 300 UNIT/ML SOPN Inject 50-56 Units into the skin See admin instructions. Take after dialysis 3 day a week. 3 pen 11  . Insulin Lispro (HUMALOG KWIKPEN) 200 UNIT/ML SOPN Inject 24-28 Units into the skin 3 (three) times daily before meals. 30 mL 5  . isosorbide mononitrate (IMDUR) 30 MG 24 hr tablet TAKE 1 TABLET BY MOUTH ONCE DAILY. 28 tablet 11  . metoprolol succinate (TOPROL-XL) 50 MG 24 hr tablet TAKE (1) TABLET BY MOUTH DAILY WITH FOOD 28 tablet 11  . midodrine (PROAMATINE) 10 MG tablet TAKE 1 TABLET BY MOUTH TWICE DAILY 56 tablet 5  . ondansetron (ZOFRAN) 4 MG tablet Take 1 tablet (4 mg total) by mouth every 8 (eight) hours as needed for nausea or vomiting. 20 tablet 0  . pregabalin (LYRICA) 50 MG capsule TAKE (1) CAPSULE BY MOUTH THREE TIMES DAILY 84 capsule 11  . sertraline (ZOLOFT) 50 MG tablet TAKE 1 TABLET BY MOUTH ONCE DAILY. (Patient taking differently: Take 50 mg by mouth daily.) 28 tablet 4  . torsemide (DEMADEX) 100 MG tablet Take 100 mg by mouth 2 (two) times daily.    Marland Kitchen ULTICARE MINI PEN NEEDLES 31G X 6 MM MISC USE AS DIRECTED 100 each 11  . [name] 500 MG chewable tablet Chew 500 mg by mouth See admin instructions. Take 500 mg with each snack and meal    . zolpidem (AMBIEN) 5 MG tablet Take 1  tablet (5 mg total) by mouth at bedtime as needed for sleep. 15 tablet 0   Facility-Administered Medications Prior to Visit  Medication Dose Route Frequency Provider Last Rate Last Admin  . 0.9 %  sodium chloride infusion   Intravenous Continuous Lockamy, Randi L, NP-C 20 mL/hr at 07/19/19 1641 Rate Verify at 07/19/19 1641    Allergies  Allergen Reactions  . Ace Inhibitors Anaphylaxis and Swelling  . Penicillins Itching, Swelling and Other (See Comments)    Did it involve swelling of the face/tongue/throat, SOB, or low BP? Unknown Did it involve sudden or severe rash/hives, skin peeling, or any reaction on the inside of your mouth or nose? Unknown Did you need to seek medical attention at a hospital or doctor's office? Unknown When did it last happen?years  If all above answers are "NO", may proceed with cephalosporin use.   . Statins Other (See Comments)    elevated LFT's    . Albuterol Swelling    Review of Systems  Constitutional: Negative for chills and fever.  HENT: Positive for ear pain. Negative for ear discharge, sinus pressure and sinus pain.   Eyes: Negative for pain and discharge.  Respiratory: Negative for cough and shortness of breath.   Cardiovascular: Negative for chest pain and palpitations.  Genitourinary: Positive for vaginal discharge. Negative for dysuria, hematuria, vaginal bleeding and vaginal pain.  Skin: Positive for rash.       Objective:    Physical Exam Vitals reviewed.  Constitutional:      General: She is not in acute distress.    Appearance: She is obese. She is not diaphoretic.  HENT:     Head: Normocephalic and atraumatic.     Left Ear: Ear canal normal.     Ears:     Comments: Dried cerumen mixed with blood in the right ear canal    Nose: Nose normal.     Mouth/Throat:  Mouth: Mucous membranes are moist.  Eyes:     General: No scleral icterus.    Extraocular Movements: Extraocular movements intact.     Pupils: Pupils are  equal, round, and reactive to light.  Cardiovascular:     Rate and Rhythm: Normal rate and regular rhythm.     Pulses: Normal pulses.     Heart sounds: Normal heart sounds. No murmur heard.   Pulmonary:     Breath sounds: Normal breath sounds. No wheezing or rales.  Abdominal:     Palpations: Abdomen is soft.     Tenderness: There is no abdominal tenderness.  Musculoskeletal:     Cervical back: Neck supple. No tenderness.     Right lower leg: No edema.     Left lower leg: No edema.  Skin:    General: Skin is warm.     Findings: Rash (Dark brown plaques over left leg) present.  Neurological:     General: No focal deficit present.     Mental Status: She is alert and oriented to person, place, and time.  Psychiatric:        Mood and Affect: Mood normal.        Behavior: Behavior normal.     BP (!) 185/66 (BP Location: Right Arm, Patient Position: Sitting, Cuff Size: Normal)   Pulse 93   Resp 18   Ht 5\' 4"  (1.626 m)   Wt 193 lb 6.4 oz (87.7 kg)   SpO2 93%   BMI 33.20 kg/m  Wt Readings from Last 3 Encounters:  06/18/20 193 lb 6.4 oz (87.7 kg)  02/28/20 190 lb 0.6 oz (86.2 kg)  10/23/19 194 lb 0.1 oz (88 kg)    Health Maintenance Due  Topic Date Due  . OPHTHALMOLOGY EXAM  01/30/2020    There are no preventive care reminders to display for this patient.   Lab Results  Component Value Date   TSH 4.16 12/24/2016   Lab Results  Component Value Date   WBC 6.9 07/19/2019   HGB 11.2 (L) 07/19/2019   HCT 36.7 07/19/2019   MCV 82.3 07/19/2019   PLT 334 07/19/2019   Lab Results  Component Value Date   NA 139 07/19/2019   K 3.6 07/27/2019   CO2 25 07/19/2019   GLUCOSE 166 (H) 07/19/2019   BUN 35 (H) 07/19/2019   CREATININE 5.96 (H) 07/19/2019   BILITOT 0.5 07/19/2019   ALKPHOS 129 (H) 07/19/2019   AST 19 07/19/2019   ALT 27 07/19/2019   PROT 7.0 07/19/2019   ALBUMIN 3.5 07/19/2019   CALCIUM 8.8 (L) 07/19/2019   ANIONGAP 18 (H) 07/19/2019   Lab Results   Component Value Date   CHOL 199 03/08/2018   Lab Results  Component Value Date   HDL 37 (L) 03/08/2018   Lab Results  Component Value Date   LDLCALC 114 (H) 03/08/2018   Lab Results  Component Value Date   TRIG 242 (H) 03/08/2018   Lab Results  Component Value Date   CHOLHDL 5.4 03/08/2018   Lab Results  Component Value Date   HGBA1C 8.4 (A) 06/18/2020       Assessment & Plan:   Problem List Items Addressed This Visit      Chronic otitis media, unspecified otitis media type     Referred to ENT for further evaluation as patient has had ear tubes placed in the past Would avoid prescribing ear drops before ENT evaluation  Other Relevant Orders  Ambulatory referral to ENT  Candidal vaginitis     Check cervicovaginal smear   Relevant Medications   fluconazole (DIFLUCAN) 150 MG tablet   Dermatitis     Leg rash - likely eczematous Kenalog cream prescribed Advised to apply lotion/moisturizer   Relevant Medications   triamcinolone (KENALOG) 0.1 %   DM HbA1C: 8.4 Needs to comply with insulin regimen Continue to check blood glucose regularly    Meds ordered this encounter  Medications  . fluconazole (DIFLUCAN) 150 MG tablet    Sig: Take 1 tablet (150 mg total) by mouth once for 1 dose.    Dispense:  1 tablet    Refill:  0  . triamcinolone (KENALOG) 0.1 %    Sig: Apply 1 application topically 2 (two) times daily.    Dispense:  30 g    Refill:  0     Carlea Badour Keith Rake, MD

## 2020-06-18 NOTE — Patient Instructions (Signed)
Please take Fluconazole as prescribed for fungal infection.  You are being referred to ENT specialist for evaluation of ear pain.

## 2020-06-19 DIAGNOSIS — D509 Iron deficiency anemia, unspecified: Secondary | ICD-10-CM | POA: Diagnosis not present

## 2020-06-19 DIAGNOSIS — N186 End stage renal disease: Secondary | ICD-10-CM | POA: Diagnosis not present

## 2020-06-19 DIAGNOSIS — N2581 Secondary hyperparathyroidism of renal origin: Secondary | ICD-10-CM | POA: Diagnosis not present

## 2020-06-19 DIAGNOSIS — D631 Anemia in chronic kidney disease: Secondary | ICD-10-CM | POA: Diagnosis not present

## 2020-06-19 DIAGNOSIS — Z992 Dependence on renal dialysis: Secondary | ICD-10-CM | POA: Diagnosis not present

## 2020-06-19 LAB — URINE CYTOLOGY ANCILLARY ONLY
Bacterial Vaginitis (gardnerella): NEGATIVE
Candida Glabrata: POSITIVE — AB
Candida Vaginitis: NEGATIVE
Comment: NEGATIVE
Comment: NEGATIVE
Comment: NEGATIVE

## 2020-06-20 DIAGNOSIS — Z992 Dependence on renal dialysis: Secondary | ICD-10-CM | POA: Diagnosis not present

## 2020-06-20 DIAGNOSIS — N186 End stage renal disease: Secondary | ICD-10-CM | POA: Diagnosis not present

## 2020-06-24 ENCOUNTER — Ambulatory Visit: Payer: 59 | Admitting: Orthopedic Surgery

## 2020-07-03 ENCOUNTER — Ambulatory Visit: Payer: 59 | Admitting: Family Medicine

## 2020-07-06 ENCOUNTER — Other Ambulatory Visit: Payer: Self-pay | Admitting: Family Medicine

## 2020-07-11 LAB — HM DIABETES EYE EXAM

## 2020-07-18 ENCOUNTER — Ambulatory Visit: Payer: 59 | Admitting: Orthopedic Surgery

## 2020-07-20 DIAGNOSIS — N186 End stage renal disease: Secondary | ICD-10-CM | POA: Diagnosis not present

## 2020-07-20 DIAGNOSIS — Z992 Dependence on renal dialysis: Secondary | ICD-10-CM | POA: Diagnosis not present

## 2020-07-23 DIAGNOSIS — Z992 Dependence on renal dialysis: Secondary | ICD-10-CM | POA: Diagnosis not present

## 2020-07-23 DIAGNOSIS — E1122 Type 2 diabetes mellitus with diabetic chronic kidney disease: Secondary | ICD-10-CM | POA: Diagnosis not present

## 2020-07-23 DIAGNOSIS — Z794 Long term (current) use of insulin: Secondary | ICD-10-CM | POA: Diagnosis not present

## 2020-07-23 DIAGNOSIS — N186 End stage renal disease: Secondary | ICD-10-CM | POA: Diagnosis not present

## 2020-07-23 DIAGNOSIS — Z01818 Encounter for other preprocedural examination: Secondary | ICD-10-CM | POA: Diagnosis not present

## 2020-07-23 DIAGNOSIS — Z7682 Awaiting organ transplant status: Secondary | ICD-10-CM | POA: Diagnosis not present

## 2020-07-23 DIAGNOSIS — I129 Hypertensive chronic kidney disease with stage 1 through stage 4 chronic kidney disease, or unspecified chronic kidney disease: Secondary | ICD-10-CM | POA: Diagnosis not present

## 2020-07-30 ENCOUNTER — Other Ambulatory Visit: Payer: Self-pay | Admitting: Family Medicine

## 2020-07-30 DIAGNOSIS — N186 End stage renal disease: Secondary | ICD-10-CM

## 2020-07-30 DIAGNOSIS — E1122 Type 2 diabetes mellitus with diabetic chronic kidney disease: Secondary | ICD-10-CM

## 2020-08-01 ENCOUNTER — Other Ambulatory Visit: Payer: Self-pay | Admitting: Family Medicine

## 2020-08-07 LAB — HM DIABETES EYE EXAM

## 2020-08-20 ENCOUNTER — Telehealth: Payer: Self-pay

## 2020-08-20 ENCOUNTER — Other Ambulatory Visit: Payer: Self-pay | Admitting: Family Medicine

## 2020-08-20 DIAGNOSIS — N186 End stage renal disease: Secondary | ICD-10-CM | POA: Diagnosis not present

## 2020-08-20 DIAGNOSIS — Z992 Dependence on renal dialysis: Secondary | ICD-10-CM | POA: Diagnosis not present

## 2020-08-20 NOTE — Telephone Encounter (Signed)
Pt left VM that no one has called her back , I called there was no voicemail set up

## 2020-08-27 ENCOUNTER — Ambulatory Visit: Payer: 59 | Admitting: Family Medicine

## 2020-08-27 ENCOUNTER — Other Ambulatory Visit: Payer: Self-pay | Admitting: Orthopedic Surgery

## 2020-08-27 ENCOUNTER — Other Ambulatory Visit: Payer: Self-pay | Admitting: Family Medicine

## 2020-08-27 DIAGNOSIS — E1122 Type 2 diabetes mellitus with diabetic chronic kidney disease: Secondary | ICD-10-CM

## 2020-08-27 DIAGNOSIS — M545 Low back pain, unspecified: Secondary | ICD-10-CM

## 2020-08-29 ENCOUNTER — Encounter: Payer: Self-pay | Admitting: Family Medicine

## 2020-08-29 ENCOUNTER — Other Ambulatory Visit: Payer: Self-pay

## 2020-08-29 ENCOUNTER — Telehealth (INDEPENDENT_AMBULATORY_CARE_PROVIDER_SITE_OTHER): Payer: 59 | Admitting: Family Medicine

## 2020-08-29 ENCOUNTER — Ambulatory Visit: Payer: 59 | Admitting: Internal Medicine

## 2020-08-29 DIAGNOSIS — I152 Hypertension secondary to endocrine disorders: Secondary | ICD-10-CM | POA: Diagnosis not present

## 2020-08-29 DIAGNOSIS — R059 Cough, unspecified: Secondary | ICD-10-CM

## 2020-08-29 DIAGNOSIS — E1159 Type 2 diabetes mellitus with other circulatory complications: Secondary | ICD-10-CM

## 2020-08-29 NOTE — Progress Notes (Signed)
  Virtual Visit via Telephone Note  I connected with Kathryn Beck on 08/29/20 at  3:00 PM EDT by telephone and verified that I am speaking with the correct person using two identifiers.  Location: Patient: HOME Provider: OFFICE   I discussed the limitations, risks, security and privacy concerns of performing an evaluation and management service by telephone and the availability of in person appointments. I also discussed with the patient that there may be a patient responsible charge related to this service. The patient expressed understanding and agreed to proceed.   History of Present Illness: COUGH , BODY ACHES, soreness in back and side, , fever , no chills and c/o fatigue x 1 week C/o elevated and uncontrolled blood pressure, states nephrology recently changed the med I had started her on, and she still bottoms out on dialysis   Observations/Objective: There were no vitals taken for this visit. Good communication with no confusion and intact memory. Alert and oriented x 3 Sounds ill   Assessment and Plan: Cough Reports 1 week h/o increased cough, dyspnea, chills, I recommend urgent care eval, she agrees  Hypertension associated with diabetes ([place]) Reports elevated and uncontrolled blood pressure as well as hypotension at dialysis, I recommend she have nephrology manage her bP  Diabetes mellitus [facility]) Ms. Fillingim is reminded of the importance of commitment to daily physical activity for 30 minutes or more, as able and the need to limit carbohydrate intake to 30 to 60 grams per meal to help with blood sugar control.   The need to take medication as prescribed, test blood sugar as directed, and to call between visits if there is a concern that blood sugar is uncontrolled is also discussed.   Ms. Crandle is reminded of the importance of daily foot exam, annual eye examination, and good blood sugar, blood pressure and cholesterol control. Uncontrolled, managed by  Endo  Diabetic Labs Latest Ref Rng & Units 06/18/2020 02/28/2020 07/19/2019 07/17/2019 04/07/2019  HbA1c 0.0 - 7.0 % 8.4(A) 7.2(A) - 7.8 -  Microalbumin Not estab mg/dL - - - - -  Micro/Creat Ratio <30 mcg/mg creat - - - - -  Chol 0 - 200 mg/dL - - - - -  HDL >40 mg/dL - - - - -  Calc LDL 0 - 99 mg/dL - - - - -  Triglycerides <150 mg/dL - - - - -  Creatinine 0.44 - 1.00 mg/dL - - 5.96(H) 8.1(A) 7.68(H)   BP/Weight 06/18/2020 02/28/2020 11/23/2019 11/08/2019 10/23/2019 11/23/8099 09/25/1023  Systolic BP 852 778 242 353 614 431 540  Diastolic BP 66 67 65 71 74 81 70  Wt. (Lbs) 193.4 190.04 - - 194.01 196 196  BMI 33.2 32.62 - 33.3 33.3 33.64 33.64   Foot/eye exam completion dates Latest Ref Rng & Units 08/07/2020 02/28/2020  Eye Exam No Retinopathy No Retinopathy -  Foot Form Completion - - Done        Follow Up Instructions:    I discussed the assessment and treatment plan with the patient. The patient was provided an opportunity to ask questions and all were answered. The patient agreed with the plan and demonstrated an understanding of the instructions.   The patient was advised to call back or seek an in-person evaluation if the symptoms worsen or if the condition fails to improve as anticipated.  I provided 8 minutes of non-face-to-face time during this encounter.   Tula Nakayama, MD

## 2020-08-29 NOTE — Patient Instructions (Addendum)
I recommend that you get evaluated at urgent care as soon as possible re cough, fever, shortness of breath   I recommend that you have your kidney Doctor manage the blood pressure  Please keep July appointment  Thanks for choosing [facility], we consider it a privelige to serve you.

## 2020-08-29 NOTE — Progress Notes (Deleted)
Patient ID: Kathryn Beck, female   DOB: [birth year], 65 y.o.   MRN: [id number]  This visit occurred during the SARS-CoV-2 public health emergency.  Safety protocols were in place, including screening questions prior to the visit, additional usage of staff PPE, and extensive cleaning of exam room while observing appropriate contact time as indicated for disinfecting solutions.   HPI: Kathryn Beck is a 65 y.o.-year-old female, initially referred by her PCP, Dr. Moshe Cipro, returning for follow-up for of DM2, dx in 2008, insulin-dependent since ~2015, uncontrolled, with complications (ESRD, PN). She saw Dr. Dorris Fetch - last OV with him 05/2016.  Last visit with me 1 year and 4 months ago.  She is here with her sister who offers part of the history especially about her insulin doses, blood sugars, and past medical history.  Interim history: She continues on hemodialysis.  Adrenal masses.  Reviewed previous imaging reports: 05/28/2017: MRI abd: Bilateral adrenal nodules with signal dropout on out of phase imaging, consistent with adenomas.   Example at 2.5 cm on the left and on the order of 1.6 cm on the right.  10/10/2015: CT abdomen and pelvis without contrast: Stable 2.2 cm left adrenal nodule with Hounsfield unit measurements 28 unchanged likely a lipid poor adenoma  08/23/2008: MRI of the abdomen with and without contrast: Left adrenal mass measuring 1.6 x 1.5 cm (previously measuring 1.4 x 1.6 cm on the CT scan from 2003 -stable in size),  Of note, she has an enlarging pancreatic head mass - pt reports a h/o pancreatitis. (This will need to be eval. By GI)  Reviewed previous adrenal biochemical investigation:  Dexamethasone suppression test showed a low normal, nonsuppressed cortisol, but I suspect that this is due to her end-stage renal disease and a deficiency in cortisol clearance, rather than decreased production. Component     Latest Ref Rng & Units 08/31/2018  Cortisol - AM     mcg/dL 6.1   Dexamethasone, Serum     ng/dL 446   A 24-hour urine cortisol could not be done as she is on dialysis  Previous investigation was negative for hyperaldosteronism or pheochromocytoma: Component     Latest Ref Rng & Units 10/04/2018          Epinephrine     pg/mL 24  Norepinephrine     pg/mL 167 (L)  Dopamine     pg/mL <10  Total Catecholamines     pg/mL 191 (L)  Metanephrine, Pl     <=57 pg/mL <25  Normetanephrine, Pl     <=148 pg/mL 38  Total Metanephrines-Plasma     <=205 pg/mL 38  ALDOSTERONE      ng/dL 2  Renin Activity     0.25 - 5.82 ng/mL/h 1.48  ALDO / PRA Ratio     0.9 - 28.9 Ratio 1.4  Potassium     3.5 - 5.1 mEq/L 4.9   Previously: Component     Latest Ref Rng & Units 11/30/2017  Epinephrine     pg/mL  undetectable  Norepinephrine     pg/mL 99  Dopamine     pg/mL  undetectable  Catecholamines, Total     pg/mL 99  Metanephrine, Pl     <=57 pg/mL <25  Normetanephrine, Pl     <=148 pg/mL 71  Total Metanephrines-Plasma     <=205 pg/mL 71  ALDOSTERONE      ng/dL 3  Renin Activity     0.25 - 5.82 ng/mL/h 1.20  ALDO / PRA  Ratio     0.9 - 28.9 Ratio 2.5  Potassium     3.5 - 5.1 mEq/L 4.0   DM2: Reviewed HbA1c levels: Lab Results  Component Value Date   HGBA1C 8.4 (A) 06/18/2020   HGBA1C 7.2 (A) 02/28/2020   HGBA1C 7.8 07/17/2019   HGBA1C 8.7 (H) 04/02/2019   HGBA1C 7.9 (A) 01/31/2019   HGBA1C 6.8 (A) 08/31/2018   HGBA1C 6.9 (H) 06/09/2018   HGBA1C 8.4 (H) 03/08/2018   HGBA1C 8.3 (H) 11/12/2017   HGBA1C 11.0 (H) 08/18/2017   HGBA1C 11.2 06/18/2017   HGBA1C 11.5 03/19/2017   HGBA1C 12.5 (H) 11/25/2016   HGBA1C 11.1 (H) 06/09/2016   HGBA1C 11.2 (H) 02/28/2016   HGBA1C 10.0 (H) 01/14/2016   HGBA1C 10.2 (H) 10/07/2015   HGBA1C 10.3 (H) 07/25/2015   HGBA1C 10.6 (H) 03/12/2015   HGBA1C 9.7 (H) 10/02/2014    Pt was on a regimen of: - Humalog 75/25 20-26 units before each meal, 2-3X a day depending on the CBG before the meal  - Levemir  100 units at bedtime  Then on: - Tresiba 40 units daily after dialysis - Humalog 20 units before a smaller meal - Humalog 32 units before a larger meal Please do not take Humalog if sugars before a meal are <60. Otherwise, take it whenever you eat.   She is currently on: - Tresiba 56 units daily  - Humalog  24 units before a smaller meal 28 units before a larger meal Take Humalog EVERY TIME you eat, 15 min before the meal, if sugars are >60. - Humalog sliding scale: - 150-175: + 1 unit  - 176-200: + 2 units  - 201-225: + 3 units  - 226-250: + 4 units  - 251-275: + 5 units - 276-300: + 6 units  Pt checks her sugars 4 times a day per review of her log: - am: 68, 103-406, 530 >> 130, 144, 198-314 >> 57, 152-223, 241 - 2h after b'fast:  31, 95-373 >> n/c >> 143, 164, 384 - before lunch: 68, 125-323 >> 151-344, 450 >> 98-201, 334, 361 - 2h after lunch: n/c >> 178 >> 350 >> 100-224 - before dinner:  64-288, 340 >> n/c >> 218-367 >> 69, 106-194 - 2h after dinner: n/c >> 190-226 - bedtime: 167-242, 362 >> 90-207 >> n/c >> 209, 226 >> 124-201, 232 - nighttime: n/c  Lowest sugar was 31 >> 130 >> 57; it is unclear at which level she has hypoglycemia awareness. Highest sugar was HI >> 530 >> 499 >> 241  Glucometer: Accuchek  Pt's meals are: - Breakfast: eggs, bacon, oatmeal, grits - Lunch: salads - Dinner: meat + veggie or sandwich + salad or nabs - Snacks: nabs, fruit cups, jello  -+ ESRD, now on dialysis Lab Results  Component Value Date   BUN 35 (H) 07/19/2019   BUN 43 (A) 07/17/2019   CREATININE 5.96 (H) 07/19/2019   CREATININE 8.1 (A) 07/17/2019  Off olmesartan. -+ HL; last set of lipids: Lab Results  Component Value Date   CHOL 199 03/08/2018   HDL 37 (L) 03/08/2018   LDLCALC 114 (H) 03/08/2018   LDLDIRECT 107 05/19/2016   TRIG 242 (H) 03/08/2018   CHOLHDL 5.4 03/08/2018  Previously on Lipitor 40, then on Zetia 10 milligrams daily. - last eye exam was on  08/07/2020: No DR. -No numbness but she has tingling in her feet.  She is on Neurontin 300 mg 3 times a day  She also has  HTN.  ROS: Constitutional: no weight gain/no weight loss, no fatigue, no subjective hyperthermia, no subjective hypothermia Eyes: no blurry vision, no xerophthalmia ENT: no sore throat, no nodules palpated in neck, no dysphagia, no odynophagia, no hoarseness Cardiovascular: no CP/no SOB/no palpitations/no leg swelling Respiratory: no cough/no SOB/no wheezing Gastrointestinal: no N/no V/no D/no C/no acid reflux Musculoskeletal: no muscle aches/no joint aches Skin: no rashes, no hair loss Neurological: no tremors/no numbness/+ tingling/no dizziness  I reviewed pt's medications, allergies, PMH, social hx, family hx, and changes were documented in the history of present illness. Otherwise, unchanged from my initial visit note.  Past Medical History:  Diagnosis Date   Acid reflux    Anemia    Arthritis    Axillary masses    Soft tissue - status post excision   Back pain    COVID-19 virus infection 04/06/2019   Depression    End-stage renal disease (HCC)    TTHSat  in [place] hypertension    History of blood transfusion    History of cardiac catheterization    Normal coronary arteries October 2020   History of claustrophobia    History of pneumonia 2019   Hypoxia 04/03/2019   Mixed hyperlipidemia    Obesity    Pancreatitis    Peritoneal dialysis catheter in place [facility])    Pneumonia due to COVID-19 virus 04/02/2019   Sleep apnea    Noncompliant with CPAP   Type 2 diabetes mellitus ([place])    Past Surgical History:  Procedure Laterality Date   ABDOMINAL HYSTERECTOMY     AV FISTULA PLACEMENT Left 09/02/2017   Procedure: creation of left arm ARTERIOVENOUS (AV) FISTULA;  Surgeon: Serafina Mitchell, MD;  Location: [place];  Service: Vascular;  Laterality: Left;   COLONOSCOPY  2008   Dr. Oneida Alar: normal    COLONOSCOPY N/A 12/18/2016   Dr. Oneida Alar:  multiple tubular adenomas, internal hemorrhoids. Surveillance in 3 years    ESOPHAGEAL DILATION N/A 10/13/2015   Procedure: ESOPHAGEAL DILATION;  Surgeon: Rogene Houston, MD;  [facility];  Service: Endoscopy;  Laterality: N/A;   ESOPHAGOGASTRODUODENOSCOPY N/A 10/13/2015   Dr. Laural Golden: chronic gastritis on path, no H.pylori. Empiric dilation    ESOPHAGOGASTRODUODENOSCOPY N/A 12/18/2016   Dr. Oneida Alar: mild gastritis. BRAVO study revealed uncontrolled GERD. Dysphagia secondary to uncontrolled reflux   FOOT SURGERY Bilateral    "nerve"     LEFT HEART CATH AND CORONARY ANGIOGRAPHY N/A 12/29/2018   Procedure: LEFT HEART CATH AND CORONARY ANGIOGRAPHY;  Surgeon: Jettie Booze, MD;  Location: [place] [facility];  Service: Cardiovascular;  Laterality: N/A;   LUNG BIOPSY     MASS EXCISION Right 01/09/2013   Procedure: EXCISION OF NEOPLASM OF RIGHT  AXILLA  AND EXCISION OF NEOPLASM OF LEFT AXILLA;  Surgeon: Jamesetta So, MD;  Location: [facility];  Service: General;  Laterality: Right;  procedure end @ 08:23   MYRINGOTOMY WITH TUBE PLACEMENT Bilateral 04/28/2017   Procedure: BILATERAL MYRINGOTOMY WITH TUBE PLACEMENT;  Surgeon: Leta Baptist, MD;  Location: [place];  Service: ENT;  Laterality: Bilateral;   REVISION OF ARTERIOVENOUS GORETEX GRAFT Left 05/04/2018   Procedure: TRANSPOSITION OF CEPHALIC VEIN ARTERIOVENOUS FISTULA LEFT ARM;  Surgeon: Rosetta Posner, MD;  Location: [place];  Service: Vascular;  Laterality: Left;   SAVORY DILATION N/A 12/18/2016   Procedure: SAVORY DILATION;  Surgeon: Danie Binder, MD;  [facility];  Service: Endoscopy;  Laterality: N/A;   Social History  Socioeconomic History   Marital status: Married    Spouse name: Not on file   Number of children: 2  Occupational History   CNA  Tobacco Use   Smoking status: Never Smoker   Smokeless tobacco: Never Used  Substance and Sexual Activity   Alcohol use: No   Drug use: No   Current Outpatient Medications  on File Prior to Visit  Medication Sig Dispense Refill   amLODipine (NORVASC) 5 MG tablet TAKE 1 TABLET BY MOUTH ONCE DAILY. 28 tablet 11   aspirin 81 MG chewable tablet CHEW 2 TABLETS BY MOUTH ONCE DAILY 56 tablet 0   aspirin EC 81 MG tablet Take 81 mg by mouth daily. Swallow whole.     calcitRIOL (ROCALTROL) 0.25 MCG capsule Take 0.25 capsules by mouth.     Continuous Blood Gluc Sensor (FREESTYLE LIBRE 14 DAY SENSOR) MISC 1 each by Does not apply route every 14 (fourteen) days. Change every 2 weeks 2 each 11   cyclobenzaprine (FLEXERIL) 5 MG tablet TAKE ONE TABLET BY MOUTH AT BEDTIME AS NEEDED FOR MUSCLE SPASMS. 30 tablet 0   DULoxetine (CYMBALTA) 60 MG capsule TAKE (1) CAPSULE BY MOUTH ONCE DAILY. 28 capsule 11   ezetimibe (ZETIA) 10 MG tablet TAKE 1 TABLET BY MOUTH ONCE DAILY. 28 tablet 11   HUMALOG KWIKPEN 200 UNIT/ML KwikPen INJECT 24-28 UNITS S.Q. THREE TIMES A DAY BEFORE MEALS. 12 mL 0   HYDROcodone-acetaminophen (NORCO/VICODIN) 5-325 MG tablet Take 1 tablet by mouth every 6 (six) hours as needed for moderate pain. 30 tablet 0   Insulin Glargine, 2 Unit Dial, (TOUJEO MAX SOLOSTAR) 300 UNIT/ML SOPN Inject 50-56 Units into the skin See admin instructions. Take after dialysis 3 day a week. 3 pen 11   isosorbide mononitrate (IMDUR) 30 MG 24 hr tablet TAKE 1 TABLET BY MOUTH ONCE DAILY. 28 tablet 11   metoprolol succinate (TOPROL-XL) 50 MG 24 hr tablet TAKE (1) TABLET BY MOUTH DAILY WITH FOOD 28 tablet 11   midodrine (PROAMATINE) 10 MG tablet TAKE 1 TABLET BY MOUTH TWICE DAILY 56 tablet 5   ondansetron (ZOFRAN) 4 MG tablet Take 1 tablet (4 mg total) by mouth every 8 (eight) hours as needed for nausea or vomiting. 20 tablet 0   pregabalin (LYRICA) 50 MG capsule TAKE (1) CAPSULE BY MOUTH THREE TIMES DAILY 84 capsule 0   sertraline (ZOLOFT) 50 MG tablet TAKE 1 TABLET BY MOUTH ONCE DAILY. 28 tablet PRN   torsemide (DEMADEX) 100 MG tablet TAKE 1 TABLET BY MOUTH TWICE DAILY 56 tablet 0    triamcinolone (KENALOG) 0.1 % Apply 1 application topically 2 (two) times daily. 30 g 0   ULTICARE MINI PEN NEEDLES 31G X 6 MM MISC USE AS DIRECTED 100 each 11   [name] 500 MG chewable tablet Chew 500 mg by mouth See admin instructions. Take 500 mg with each snack and meal     zolpidem (AMBIEN) 5 MG tablet TAKE 1 TABLET BY MOUTH AT BEDTIME AS NEEDED. 30 tablet 0   [DISCONTINUED] FLUoxetine (PROZAC) 10 MG capsule Take 10 mg by mouth daily.       [DISCONTINUED] glipiZIDE (GLUCOTROL) 10 MG tablet Take 10 mg by mouth 2 (two) times daily before a meal.       Current Facility-Administered Medications on File Prior to Visit  Medication Dose Route Frequency Provider Last Rate Last Admin   0.9 %  sodium chloride infusion   Intravenous Continuous Lockamy, Randi L, NP-C 20 mL/hr at 07/19/19 1641  Rate Verify at 07/19/19 1641   Allergies  Allergen Reactions   Ace Inhibitors Anaphylaxis and Swelling   Penicillins Itching, Swelling and Other (See Comments)    Did it involve swelling of the face/tongue/throat, SOB, or low BP? Unknown Did it involve sudden or severe rash/hives, skin peeling, or any reaction on the inside of your mouth or nose? Unknown Did you need to seek medical attention at a hospital or doctor's office? Unknown When did it last happen?      years  If all above answers are "NO", may proceed with cephalosporin use.    Statins Other (See Comments)    elevated LFT's     Albuterol Swelling   Family History  Problem Relation Age of Onset   Hypertension Father    Hypercholesterolemia Father    Arthritis Father    Hypertension Sister    Hypercholesterolemia Sister    Breast cancer Sister    Hypertension Sister    Colon cancer Neg Hx    Colon polyps Neg Hx     PE: There were no vitals taken for this visit. Wt Readings from Last 3 Encounters:  06/18/20 193 lb 6.4 oz (87.7 kg)  02/28/20 190 lb 0.6 oz (86.2 kg)  10/23/19 194 lb 0.1 oz (88 kg)   Constitutional: overweight, in  NAD Eyes: PERRLA, EOMI, no exophthalmos ENT: moist mucous membranes, no thyromegaly, no cervical lymphadenopathy Cardiovascular: Tachycardia RR, No RG, +1/6 SEM, + bilateral leg swelling, pitting Respiratory: CTA B Gastrointestinal: abdomen soft, NT, ND, BS+ Musculoskeletal: no deformities, strength intact in all 4 Skin: moist, warm, no rashes Neurological: no tremor with outstretched hands, DTR normal in all 4  ASSESSMENT: 1. DM2, insulin-dependent, uncontrolled, with complications - CKD stage 4 - PN  2. HL  3. B adrenal adenomas  PLAN:  1. Patient with longstanding, uncontrolled, type 2 diabetes, previously on a premixed insulin regimen and also long-acting insulin, now basal-bolus insulin regimen.  Her sugars improved significantly after she started dialysis, but they continue to be fluctuating.  We tried to add a GLP-1 receptor agonist but she had repeated episodes of pancreatitis so she is now off the medication.  At last visit, patient was taking [place] in the morning, before dialysis, despite repeated advised to take it after dialysis.  At that time, she was also on Toujeo (?) added by another provider.  I advised her to stop either [place] or Toujeo, which ever is more expensive.  We did not change her Humalog doses at that time.  HbA1c was 8.7%, but she was lost for follow-up afterwards.  Her HbA1c decreased to 7.2% in 02/2020 but she had another HbA1c obtained on 07/23/2020 and this was 8.1%, higher.  - I suggested to:  Patient Instructions  Please continue: - Tresiba/Toujeo 56 units in the evening - Humalog  24 units before a smaller meal 28 units before a larger meal Take Humalog EVERY TIME you eat, 15 min before the meal, if sugars are >60. - Humalog sliding scale: - 150-175: + 1 unit  - 176-200: + 2 units  - 201-225: + 3 units  - 226-250: + 4 units  - 251-275: + 5 units - 276-300: + 6 units  Please return in 3 months with your sugar log.   - we checked her HbA1c:  7%  - advised to check sugars at different times of the day - 3-4x a day, rotating check times - advised for yearly eye exams >> she is UTD - return  to clinic in 3 months   2. HL -Reviewed latest lipid panel from 2019: LDL above goal, HDL low, to exercise high: Lab Results  Component Value Date   CHOL 199 03/08/2018   HDL 37 (L) 03/08/2018   LDLCALC 114 (H) 03/08/2018   LDLDIRECT 107 05/19/2016   TRIG 242 (H) 03/08/2018   CHOLHDL 5.4 03/08/2018  -Continues Zetia 10 mg daily without side effects -She is due for another lipid panel  3.  Bilateral adrenal adenoma -Patient with history of bilateral adrenal adenomas per review of her abdominal MRI report from 2019.  The imaging characteristics pointed towards benign tumors, of 2.5 x 1.6 cm, respectively.  However, they have appeared to increase in size over time.  Reviewing the MRI from 2010 and the CT from 2017, only 1 nodule was seen in both, in the left adrenal. -Recheck plasma catecholamines, metanephrines, and aldosterone and they were all normal in 11/2017 while norepinephrine was slightly elevated (which is nonspecific) in 09/2018.  Her dexamethasone suppression test was abnormal, but the cortisol was slightly high possibly due to her end-stage renal disease and not actually increased cortisol production.  We cannot check a 24-hour urine cortisol as she is on dialysis.   -She was lost to follow-up afterwards and returns after 1 year and 4 months.  At today's visit, we will repeat her plasma catecholamines and metanephrines and also PRA and aldosterone.  At today's visit, we discussed about checking a late-night salivary cortisol x2. -I do not feel we need to repeat her imaging studies for now.  Kathryn Kingdom, MD PhD [facility] Endocrinology

## 2020-08-30 NOTE — Assessment & Plan Note (Signed)
Reports elevated and uncontrolled blood pressure as well as hypotension at dialysis, I recommend she have nephrology manage her bP

## 2020-08-30 NOTE — Assessment & Plan Note (Addendum)
Reports 1 week h/o increased cough, dyspnea, chills, I recommend urgent care eval, she agrees

## 2020-08-30 NOTE — Assessment & Plan Note (Signed)
Kathryn Beck is reminded of the importance of commitment to daily physical activity for 30 minutes or more, as able and the need to limit carbohydrate intake to 30 to 60 grams per meal to help with blood sugar control.   The need to take medication as prescribed, test blood sugar as directed, and to call between visits if there is a concern that blood sugar is uncontrolled is also discussed.   Kathryn Beck is reminded of the importance of daily foot exam, annual eye examination, and good blood sugar, blood pressure and cholesterol control. Uncontrolled, managed by Endo  Diabetic Labs Latest Ref Rng & Units 06/18/2020 02/28/2020 07/19/2019 07/17/2019 04/07/2019  HbA1c 0.0 - 7.0 % 8.4(A) 7.2(A) - 7.8 -  Microalbumin Not estab mg/dL - - - - -  Micro/Creat Ratio <30 mcg/mg creat - - - - -  Chol 0 - 200 mg/dL - - - - -  HDL >40 mg/dL - - - - -  Calc LDL 0 - 99 mg/dL - - - - -  Triglycerides <150 mg/dL - - - - -  Creatinine 0.44 - 1.00 mg/dL - - 5.96(H) 8.1(A) 7.68(H)   BP/Weight 06/18/2020 02/28/2020 11/23/2019 11/08/2019 10/23/2019 0/05/7541 6/0/6770  Systolic BP 340 352 481 859 093 112 162  Diastolic BP 66 67 65 71 74 81 70  Wt. (Lbs) 193.4 190.04 - - 194.01 196 196  BMI 33.2 32.62 - 33.3 33.3 33.64 33.64   Foot/eye exam completion dates Latest Ref Rng & Units 08/07/2020 02/28/2020  Eye Exam No Retinopathy No Retinopathy -  Foot Form Completion - - Done

## 2020-09-03 ENCOUNTER — Ambulatory Visit: Payer: 59 | Admitting: Internal Medicine

## 2020-09-03 NOTE — Progress Notes (Deleted)
Patient ID: Kathryn Beck, female   DOB: 05/29/1955, 65 y.o.   MRN: [id number]  This visit occurred during the SARS-CoV-2 public health emergency.  Safety protocols were in place, including screening questions prior to the visit, additional usage of staff PPE, and extensive cleaning of exam room while observing appropriate contact time as indicated for disinfecting solutions.   HPI: Kathryn Beck is a 65 y.o.-year-old female, initially referred by her PCP, Dr. Moshe Cipro, returning for follow-up for of DM2, dx in 2008, insulin-dependent since ~2015, uncontrolled, with complications (ESRD, PN). She saw Dr. Dorris Fetch - last OV with him 05/2016.  Last visit with me 1 year and 4 months ago.  She is here with her sister who offers part of the history especially about her insulin doses, blood sugars, and past medical history.  Interim history: She continues on hemodialysis.  Adrenal masses.  Reviewed previous imaging reports: 05/28/2017: MRI abd: Bilateral adrenal nodules with signal dropout on out of phase imaging, consistent with adenomas.   Example at 2.5 cm on the left and on the order of 1.6 cm on the right.  10/10/2015: CT abdomen and pelvis without contrast: Stable 2.2 cm left adrenal nodule with Hounsfield unit measurements 28 unchanged likely a lipid poor adenoma  08/23/2008: MRI of the abdomen with and without contrast: Left adrenal mass measuring 1.6 x 1.5 cm (previously measuring 1.4 x 1.6 cm on the CT scan from 2003 -stable in size),  Of note, she has an enlarging pancreatic head mass - pt reports a h/o pancreatitis. (This will need to be eval. By GI)  Reviewed previous adrenal biochemical investigation:  Dexamethasone suppression test showed a low normal, nonsuppressed cortisol, but I suspect that this is due to her end-stage renal disease and a deficiency in cortisol clearance, rather than decreased production. Component     Latest Ref Rng & Units 08/31/2018  Cortisol - AM     mcg/dL 6.1   Dexamethasone, Serum     ng/dL 446   A 24-hour urine cortisol could not be done as she is on dialysis  Previous investigation was negative for hyperaldosteronism or pheochromocytoma: Component     Latest Ref Rng & Units 10/04/2018          Epinephrine     pg/mL 24  Norepinephrine     pg/mL 167 (L)  Dopamine     pg/mL <10  Total Catecholamines     pg/mL 191 (L)  Metanephrine, Pl     <=57 pg/mL <25  Normetanephrine, Pl     <=148 pg/mL 38  Total Metanephrines-Plasma     <=205 pg/mL 38  ALDOSTERONE      ng/dL 2  Renin Activity     0.25 - 5.82 ng/mL/h 1.48  ALDO / PRA Ratio     0.9 - 28.9 Ratio 1.4  Potassium     3.5 - 5.1 mEq/L 4.9   Previously: Component     Latest Ref Rng & Units 11/30/2017  Epinephrine     pg/mL  undetectable  Norepinephrine     pg/mL 99  Dopamine     pg/mL  undetectable  Catecholamines, Total     pg/mL 99  Metanephrine, Pl     <=57 pg/mL <25  Normetanephrine, Pl     <=148 pg/mL 71  Total Metanephrines-Plasma     <=205 pg/mL 71  ALDOSTERONE      ng/dL 3  Renin Activity     0.25 - 5.82 ng/mL/h 1.20  ALDO / PRA  Ratio     0.9 - 28.9 Ratio 2.5  Potassium     3.5 - 5.1 mEq/L 4.0   DM2: Reviewed HbA1c levels: 07/23/2020: HbA1c 8.1% Lab Results  Component Value Date   HGBA1C 8.4 (A) 06/18/2020   HGBA1C 7.2 (A) 02/28/2020   HGBA1C 7.8 07/17/2019   HGBA1C 8.7 (H) 04/02/2019   HGBA1C 7.9 (A) 01/31/2019   HGBA1C 6.8 (A) 08/31/2018   HGBA1C 6.9 (H) 06/09/2018   HGBA1C 8.4 (H) 03/08/2018   HGBA1C 8.3 (H) 11/12/2017   HGBA1C 11.0 (H) 08/18/2017   HGBA1C 11.2 06/18/2017   HGBA1C 11.5 03/19/2017   HGBA1C 12.5 (H) 11/25/2016   HGBA1C 11.1 (H) 06/09/2016   HGBA1C 11.2 (H) 02/28/2016   HGBA1C 10.0 (H) 01/14/2016   HGBA1C 10.2 (H) 10/07/2015   HGBA1C 10.3 (H) 07/25/2015   HGBA1C 10.6 (H) 03/12/2015   HGBA1C 9.7 (H) 10/02/2014    Pt was on a regimen of: - Humalog 75/25 20-26 units before each meal, 2-3X a day depending on the CBG before  the meal  - Levemir 100 units at bedtime  Then on: - Tresiba 40 units daily after dialysis - Humalog 20 units before a smaller meal - Humalog 32 units before a larger meal Please do not take Humalog if sugars before a meal are <60. Otherwise, take it whenever you eat.   She is currently on: - Tresiba 56 units daily  - Humalog  24 units before a smaller meal 28 units before a larger meal Take Humalog EVERY TIME you eat, 15 min before the meal, if sugars are >60. - Humalog sliding scale: - 150-175: + 1 unit  - 176-200: + 2 units  - 201-225: + 3 units  - 226-250: + 4 units  - 251-275: + 5 units - 276-300: + 6 units  Pt checks her sugars 4 times a day per review of her log: - am: 68, 103-406, 530 >> 130, 144, 198-314 >> 57, 152-223, 241 - 2h after b'fast:  31, 95-373 >> n/c >> 143, 164, 384 - before lunch: 68, 125-323 >> 151-344, 450 >> 98-201, 334, 361 - 2h after lunch: n/c >> 178 >> 350 >> 100-224 - before dinner:  64-288, 340 >> n/c >> 218-367 >> 69, 106-194 - 2h after dinner: n/c >> 190-226 - bedtime: 167-242, 362 >> 90-207 >> n/c >> 209, 226 >> 124-201, 232 - nighttime: n/c  Lowest sugar was 31 >> 130 >> 57; it is unclear at which level she has hypoglycemia awareness. Highest sugar was HI >> 530 >> 499 >> 241  Glucometer: Accuchek  Pt's meals are: - Breakfast: eggs, bacon, oatmeal, grits - Lunch: salads - Dinner: meat + veggie or sandwich + salad or nabs - Snacks: nabs, fruit cups, jello  -+ ESRD, now on dialysis Lab Results  Component Value Date   BUN 35 (H) 07/19/2019   BUN 43 (A) 07/17/2019   CREATININE 5.96 (H) 07/19/2019   CREATININE 8.1 (A) 07/17/2019  Off olmesartan. -+ HL; last set of lipids: Lab Results  Component Value Date   CHOL 199 03/08/2018   HDL 37 (L) 03/08/2018   LDLCALC 114 (H) 03/08/2018   LDLDIRECT 107 05/19/2016   TRIG 242 (H) 03/08/2018   CHOLHDL 5.4 03/08/2018  Previously on Lipitor 40, then on Zetia 10 milligrams daily. - last  eye exam was on 08/07/2020: No DR. -No numbness but she has tingling in her feet.  She is on Neurontin 300 mg 3 times a day  She also has HTN.  ROS: Constitutional: no weight gain/no weight loss, no fatigue, no subjective hyperthermia, no subjective hypothermia Eyes: no blurry vision, no xerophthalmia ENT: no sore throat, no nodules palpated in neck, no dysphagia, no odynophagia, no hoarseness Cardiovascular: no CP/no SOB/no palpitations/no leg swelling Respiratory: no cough/no SOB/no wheezing Gastrointestinal: no N/no V/no D/no C/no acid reflux Musculoskeletal: no muscle aches/no joint aches Skin: no rashes, no hair loss Neurological: no tremors/no numbness/+ tingling/no dizziness  I reviewed pt's medications, allergies, PMH, social hx, family hx, and changes were documented in the history of present illness. Otherwise, unchanged from my initial visit note.  Past Medical History:  Diagnosis Date   Acid reflux    Anemia    Arthritis    Axillary masses    Soft tissue - status post excision   Back pain    COVID-19 virus infection 04/06/2019   Depression    End-stage renal disease (HCC)    [facility]  in [place] hypertension    History of blood transfusion    History of cardiac catheterization    Normal coronary arteries October 2020   History of claustrophobia    History of pneumonia 2019   Hypoxia 04/03/2019   Mixed hyperlipidemia    Obesity    Pancreatitis    Peritoneal dialysis catheter in place [facility])    Pneumonia due to COVID-19 virus 04/02/2019   Sleep apnea    Noncompliant with CPAP   Type 2 diabetes mellitus ([place])    Past Surgical History:  Procedure Laterality Date   ABDOMINAL HYSTERECTOMY     AV FISTULA PLACEMENT Left 09/02/2017   Procedure: creation of left arm ARTERIOVENOUS (AV) FISTULA;  Surgeon: Serafina Mitchell, MD;  Location: [place];  Service: Vascular;  Laterality: Left;   COLONOSCOPY  2008   Dr. Oneida Alar: normal    COLONOSCOPY N/A 12/18/2016    Dr. Oneida Alar: multiple tubular adenomas, internal hemorrhoids. Surveillance in 3 years    ESOPHAGEAL DILATION N/A 10/13/2015   Procedure: ESOPHAGEAL DILATION;  Surgeon: Rogene Houston, MD;  [facility];  Service: Endoscopy;  Laterality: N/A;   ESOPHAGOGASTRODUODENOSCOPY N/A 10/13/2015   Dr. Laural Golden: chronic gastritis on path, no H.pylori. Empiric dilation    ESOPHAGOGASTRODUODENOSCOPY N/A 12/18/2016   Dr. Oneida Alar: mild gastritis. BRAVO study revealed uncontrolled GERD. Dysphagia secondary to uncontrolled reflux   FOOT SURGERY Bilateral    "nerve"     LEFT HEART CATH AND CORONARY ANGIOGRAPHY N/A 12/29/2018   Procedure: LEFT HEART CATH AND CORONARY ANGIOGRAPHY;  Surgeon: Jettie Booze, MD;  Location: [place] [facility];  Service: Cardiovascular;  Laterality: N/A;   LUNG BIOPSY     MASS EXCISION Right 01/09/2013   Procedure: EXCISION OF NEOPLASM OF RIGHT  AXILLA  AND EXCISION OF NEOPLASM OF LEFT AXILLA;  Surgeon: Jamesetta So, MD;  Location: [facility];  Service: General;  Laterality: Right;  procedure end @ 08:23   MYRINGOTOMY WITH TUBE PLACEMENT Bilateral 04/28/2017   Procedure: BILATERAL MYRINGOTOMY WITH TUBE PLACEMENT;  Surgeon: Leta Baptist, MD;  Location: [place];  Service: ENT;  Laterality: Bilateral;   REVISION OF ARTERIOVENOUS GORETEX GRAFT Left 05/04/2018   Procedure: TRANSPOSITION OF CEPHALIC VEIN ARTERIOVENOUS FISTULA LEFT ARM;  Surgeon: Rosetta Posner, MD;  Location: [place];  Service: Vascular;  Laterality: Left;   SAVORY DILATION N/A 12/18/2016   Procedure: SAVORY DILATION;  Surgeon: Danie Binder, MD;  [facility];  Service: Endoscopy;  Laterality: N/A;  Social History   Socioeconomic History   Marital status: Married    Spouse name: Not on file   Number of children: 2  Occupational History   CNA  Tobacco Use   Smoking status: Never Smoker   Smokeless tobacco: Never Used  Substance and Sexual Activity   Alcohol use: No   Drug use: No   Current Outpatient  Medications on File Prior to Visit  Medication Sig Dispense Refill   amLODipine (NORVASC) 5 MG tablet TAKE 1 TABLET BY MOUTH ONCE DAILY. 28 tablet 11   aspirin 81 MG chewable tablet CHEW 2 TABLETS BY MOUTH ONCE DAILY 56 tablet 0   aspirin EC 81 MG tablet Take 81 mg by mouth daily. Swallow whole.     calcitRIOL (ROCALTROL) 0.25 MCG capsule Take 0.25 capsules by mouth.     Continuous Blood Gluc Sensor (FREESTYLE LIBRE 14 DAY SENSOR) MISC 1 each by Does not apply route every 14 (fourteen) days. Change every 2 weeks 2 each 11   cyclobenzaprine (FLEXERIL) 5 MG tablet TAKE ONE TABLET BY MOUTH AT BEDTIME AS NEEDED FOR MUSCLE SPASMS. 30 tablet 0   DULoxetine (CYMBALTA) 60 MG capsule TAKE (1) CAPSULE BY MOUTH ONCE DAILY. 28 capsule 11   ezetimibe (ZETIA) 10 MG tablet TAKE 1 TABLET BY MOUTH ONCE DAILY. 28 tablet 11   HUMALOG KWIKPEN 200 UNIT/ML KwikPen INJECT 24-28 UNITS S.Q. THREE TIMES A DAY BEFORE MEALS. 12 mL 0   HYDROcodone-acetaminophen (NORCO/VICODIN) 5-325 MG tablet Take 1 tablet by mouth every 6 (six) hours as needed for moderate pain. 30 tablet 0   Insulin Glargine, 2 Unit Dial, (TOUJEO MAX SOLOSTAR) 300 UNIT/ML SOPN Inject 50-56 Units into the skin See admin instructions. Take after dialysis 3 day a week. 3 pen 11   isosorbide mononitrate (IMDUR) 30 MG 24 hr tablet TAKE 1 TABLET BY MOUTH ONCE DAILY. 28 tablet 11   metoprolol succinate (TOPROL-XL) 50 MG 24 hr tablet TAKE (1) TABLET BY MOUTH DAILY WITH FOOD 28 tablet 11   midodrine (PROAMATINE) 10 MG tablet TAKE 1 TABLET BY MOUTH TWICE DAILY 56 tablet 5   ondansetron (ZOFRAN) 4 MG tablet Take 1 tablet (4 mg total) by mouth every 8 (eight) hours as needed for nausea or vomiting. 20 tablet 0   pregabalin (LYRICA) 50 MG capsule TAKE (1) CAPSULE BY MOUTH THREE TIMES DAILY 84 capsule 0   sertraline (ZOLOFT) 50 MG tablet TAKE 1 TABLET BY MOUTH ONCE DAILY. 28 tablet PRN   torsemide (DEMADEX) 100 MG tablet TAKE 1 TABLET BY MOUTH TWICE DAILY 56 tablet 0    triamcinolone (KENALOG) 0.1 % Apply 1 application topically 2 (two) times daily. 30 g 0   ULTICARE MINI PEN NEEDLES 31G X 6 MM MISC USE AS DIRECTED 100 each 11   [name] 500 MG chewable tablet Chew 500 mg by mouth See admin instructions. Take 500 mg with each snack and meal     zolpidem (AMBIEN) 5 MG tablet TAKE 1 TABLET BY MOUTH AT BEDTIME AS NEEDED. 30 tablet 0   [DISCONTINUED] FLUoxetine (PROZAC) 10 MG capsule Take 10 mg by mouth daily.       [DISCONTINUED] glipiZIDE (GLUCOTROL) 10 MG tablet Take 10 mg by mouth 2 (two) times daily before a meal.       Current Facility-Administered Medications on File Prior to Visit  Medication Dose Route Frequency Provider Last Rate Last Admin   0.9 %  sodium chloride infusion   Intravenous Continuous Lockamy, Randi L, NP-C 20  mL/hr at 07/19/19 1641 Rate Verify at 07/19/19 1641   Allergies  Allergen Reactions   Ace Inhibitors Anaphylaxis and Swelling   Penicillins Itching, Swelling and Other (See Comments)    Did it involve swelling of the face/tongue/throat, SOB, or low BP? Unknown Did it involve sudden or severe rash/hives, skin peeling, or any reaction on the inside of your mouth or nose? Unknown Did you need to seek medical attention at a hospital or doctor's office? Unknown When did it last happen?      years  If all above answers are "NO", may proceed with cephalosporin use.    Statins Other (See Comments)    elevated LFT's     Albuterol Swelling   Family History  Problem Relation Age of Onset   Hypertension Father    Hypercholesterolemia Father    Arthritis Father    Hypertension Sister    Hypercholesterolemia Sister    Breast cancer Sister    Hypertension Sister    Colon cancer Neg Hx    Colon polyps Neg Hx     PE: There were no vitals taken for this visit. Wt Readings from Last 3 Encounters:  06/18/20 193 lb 6.4 oz (87.7 kg)  02/28/20 190 lb 0.6 oz (86.2 kg)  10/23/19 194 lb 0.1 oz (88 kg)   Constitutional: overweight, in  NAD Eyes: PERRLA, EOMI, no exophthalmos ENT: moist mucous membranes, no thyromegaly, no cervical lymphadenopathy Cardiovascular: Tachycardia RR, No RG, +1/6 SEM, + bilateral leg swelling, pitting Respiratory: CTA B Gastrointestinal: abdomen soft, NT, ND, BS+ Musculoskeletal: no deformities, strength intact in all 4 Skin: moist, warm, no rashes Neurological: no tremor with outstretched hands, DTR normal in all 4  ASSESSMENT: 1. DM2, insulin-dependent, uncontrolled, with complications - CKD stage 4 - PN  2. HL  3. B adrenal adenomas  PLAN:  1. Patient with longstanding, uncontrolled, type 2 diabetes, previously on the premixed insulin regimen and also long-acting insulin, now on basal-bolus insulin regimen.  Sugars improved significantly after she started dialysis but they continue to be fluctuating.  We tried to add a GLP-1 receptor agonist but she had repeated episodes of pancreatitis so she had to come off.  At last visit, patient was taking [place] in the morning, before dialysis, despite repeated advice to take it later in the day, after dialysis.  At that time, she was also on Toujeo (?)  Added by another provider.  I advised her to stop either [place] or Toujeo, whichever was more expensive.  We did not change her Humalog dose at that time.  HbA1c was 8.7%, but she was lost for follow-up afterwards.  Her HbA1c decreased to 7.2% in 02/2020 but she had another HbA1c obtained on 07/23/2020 and this was 8.1%, higher than at last visit, but improved from 05/2020.  - I suggested to:  Patient Instructions  Please continue: - Tresiba/Toujeo 56 units in the evening - Humalog  24 units before a smaller meal 28 units before a larger meal Take Humalog EVERY TIME you eat, 15 min before the meal, if sugars are >60. - Humalog sliding scale: - 150-175: + 1 unit  - 176-200: + 2 units  - 201-225: + 3 units  - 226-250: + 4 units  - 251-275: + 5 units - 276-300: + 6 units  Please return in 3  months with your sugar log.   - we checked her HbA1c: 7%  - advised to check sugars at different times of the day - 3-4x a day,  rotating check times - advised for yearly eye exams >> she is UTD - return to clinic in 3 months  2. HL -Reviewed latest lipid panel from 2019: LDL above goal, HDL low, triglycerides high: Lab Results  Component Value Date   CHOL 199 03/08/2018   HDL 37 (L) 03/08/2018   LDLCALC 114 (H) 03/08/2018   LDLDIRECT 107 05/19/2016   TRIG 242 (H) 03/08/2018   CHOLHDL 5.4 03/08/2018  -Continues Zetia 10 mg daily without side effects -She is due for another lipid panel  3.  Bilateral adrenal adenoma -Patient with history of bilateral adrenal adenomas per review of her abdominal MRI report from 2019.  Imaging characteristics pointing towards benign tumors, of 2.5 x 1.6 cm, respectively.  However, they appeared to increase in size over time.  Reviewing the MRI from 2010 and the CT from 2017, only 1 nodule was seen in both, in the left adrenal. -Plasma catecholamines, metanephrines, and aldosterone were all normal in 11/2017, while norepinephrine was slightly elevated (which is nonspecific) in 09/2018.  Her dexamethasone suppression test was slightly abnormal, but the cortisol was slightly high possibly due to her end-stage renal disease and not actually increased cortisol production.  We cannot check a 24-hour urine cortisol on her as she is on dialysis. -She was lost for follow-up afterwards, and returns after almost 1.5 years.  At today's visit, we will repeat her plasma catecholamines and metanephrines and also PRA and aldosterone.   -For now, we will continue to follow her clinically for her mildly elevated cortisol -We do not need to repeat her imaging studies for now  [name], MD PhD [facility] Endocrinology

## 2020-09-17 ENCOUNTER — Other Ambulatory Visit: Payer: Self-pay | Admitting: Family Medicine

## 2020-09-19 DIAGNOSIS — N186 End stage renal disease: Secondary | ICD-10-CM | POA: Diagnosis not present

## 2020-09-19 DIAGNOSIS — Z992 Dependence on renal dialysis: Secondary | ICD-10-CM | POA: Diagnosis not present

## 2020-09-24 ENCOUNTER — Ambulatory Visit (INDEPENDENT_AMBULATORY_CARE_PROVIDER_SITE_OTHER): Payer: 59 | Admitting: Family Medicine

## 2020-09-24 ENCOUNTER — Encounter: Payer: Self-pay | Admitting: Family Medicine

## 2020-09-24 ENCOUNTER — Other Ambulatory Visit: Payer: Self-pay

## 2020-09-24 VITALS — BP 157/72 | HR 84 | Temp 99.0°F | Resp 18 | Ht 64.0 in | Wt 197.0 lb

## 2020-09-24 DIAGNOSIS — R42 Dizziness and giddiness: Secondary | ICD-10-CM

## 2020-09-24 DIAGNOSIS — I152 Hypertension secondary to endocrine disorders: Secondary | ICD-10-CM

## 2020-09-24 DIAGNOSIS — R6889 Other general symptoms and signs: Secondary | ICD-10-CM

## 2020-09-24 DIAGNOSIS — Z992 Dependence on renal dialysis: Secondary | ICD-10-CM

## 2020-09-24 DIAGNOSIS — F322 Major depressive disorder, single episode, severe without psychotic features: Secondary | ICD-10-CM

## 2020-09-24 DIAGNOSIS — E1159 Type 2 diabetes mellitus with other circulatory complications: Secondary | ICD-10-CM

## 2020-09-24 DIAGNOSIS — E1122 Type 2 diabetes mellitus with diabetic chronic kidney disease: Secondary | ICD-10-CM

## 2020-09-24 DIAGNOSIS — R4701 Aphasia: Secondary | ICD-10-CM | POA: Diagnosis not present

## 2020-09-24 DIAGNOSIS — N186 End stage renal disease: Secondary | ICD-10-CM

## 2020-09-24 DIAGNOSIS — R413 Other amnesia: Secondary | ICD-10-CM

## 2020-09-24 DIAGNOSIS — H6123 Impacted cerumen, bilateral: Secondary | ICD-10-CM

## 2020-09-24 DIAGNOSIS — Z794 Long term (current) use of insulin: Secondary | ICD-10-CM

## 2020-09-24 DIAGNOSIS — H9203 Otalgia, bilateral: Secondary | ICD-10-CM

## 2020-09-24 DIAGNOSIS — R55 Syncope and collapse: Secondary | ICD-10-CM

## 2020-09-24 MED ORDER — SERTRALINE HCL 100 MG PO TABS: 100.0000 mg | ORAL_TABLET | Freq: Every day | ORAL | 3 refills | Status: DC
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
## 2020-09-24 NOTE — Patient Instructions (Addendum)
F/u in 6 weeks re evaluate depression  You are referred to Neurology and to ENT  Increased dose of zoloft to 100 mg daily and you are referred to Therapist who will call you  [place] get covid booster #2 and your shingles vaccines

## 2020-09-26 DIAGNOSIS — R413 Other amnesia: Secondary | ICD-10-CM | POA: Insufficient documentation

## 2020-09-26 DIAGNOSIS — H6123 Impacted cerumen, bilateral: Secondary | ICD-10-CM | POA: Insufficient documentation

## 2020-09-26 DIAGNOSIS — R42 Dizziness and giddiness: Secondary | ICD-10-CM | POA: Insufficient documentation

## 2020-09-26 DIAGNOSIS — R4701 Aphasia: Secondary | ICD-10-CM | POA: Insufficient documentation

## 2020-09-26 DIAGNOSIS — R6889 Other general symptoms and signs: Secondary | ICD-10-CM | POA: Insufficient documentation

## 2020-09-26 NOTE — Assessment & Plan Note (Signed)
Elevated at visit, however experiences recurrent hypotension at dialysis, Nephrology to manage

## 2020-09-26 NOTE — Assessment & Plan Note (Signed)
Uncontrolled increase the dose of Zoloft and refer for therapy.  Reevaluate in 6 weeks.

## 2020-09-26 NOTE — Assessment & Plan Note (Signed)
[name] is reminded of the importance of commitment to daily physical activity for 30 minutes or more, as able and the need to limit carbohydrate intake to 30 to 60 grams per meal to help with blood sugar control.   The need to take medication as prescribed, test blood sugar as directed, and to call between visits if there is a concern that blood sugar is uncontrolled is also discussed.   [name] is reminded of the importance of daily foot exam, annual eye examination, and good blood sugar, blood pressure and cholesterol control. Uncontrolled managed by endocrinology.  Diabetic Labs Latest Ref Rng & Units 06/18/2020 02/28/2020 07/19/2019 07/17/2019 04/07/2019  HbA1c 0.0 - 7.0 % 8.4(A) 7.2(A) - 7.8 -  Microalbumin Not estab mg/dL - - - - -  Micro/Creat Ratio <30 mcg/mg creat - - - - -  Chol 0 - 200 mg/dL - - - - -  HDL >40 mg/dL - - - - -  Calc LDL 0 - 99 mg/dL - - - - -  Triglycerides <150 mg/dL - - - - -  Creatinine 0.44 - 1.00 mg/dL - - 5.96(H) 8.1(A) 7.68(H)   BP/Weight 09/24/2020 06/18/2020 02/28/2020 11/23/2019 11/08/2019 10/24/1280 0/10/1386  Systolic BP 719 597 471 855 015 868 257  Diastolic BP 72 66 67 65 71 74 81  Wt. (Lbs) 197 193.4 190.04 - - 194.01 196  BMI 33.81 33.2 32.62 - 33.3 33.3 33.64   Foot/eye exam completion dates Latest Ref Rng & Units 08/07/2020 02/28/2020  Eye Exam No Retinopathy No Retinopathy -  Foot Form Completion - - Done

## 2020-09-26 NOTE — Assessment & Plan Note (Signed)
Reports recurrent/ persistent vertigo, refer eNT

## 2020-09-26 NOTE — Assessment & Plan Note (Signed)
Refer eNT

## 2020-09-26 NOTE — Assessment & Plan Note (Signed)
New concern of difficulty finding words at times, light headeness/ near syncope and memory loss, Neurology to eval

## 2020-09-26 NOTE — Assessment & Plan Note (Signed)
Notes increasing forgetfullness over past 3 to 4 months, Neurology to eval

## 2020-09-26 NOTE — Assessment & Plan Note (Signed)
Recurrent episodes ,  Most recently while driving , I dvised pt and her husband that she is not to drive, he stated he will come pick her up

## 2020-10-02 ENCOUNTER — Ambulatory Visit: Payer: 59

## 2020-10-02 ENCOUNTER — Ambulatory Visit (INDEPENDENT_AMBULATORY_CARE_PROVIDER_SITE_OTHER): Payer: 59 | Admitting: Licensed Clinical Social Worker

## 2020-10-02 ENCOUNTER — Other Ambulatory Visit: Payer: Self-pay

## 2020-10-02 DIAGNOSIS — T732XXA Exhaustion due to exposure, initial encounter: Secondary | ICD-10-CM

## 2020-10-02 DIAGNOSIS — F322 Major depressive disorder, single episode, severe without psychotic features: Secondary | ICD-10-CM

## 2020-10-02 NOTE — Progress Notes (Signed)
Writer called both numbers on file. Unable to leave voicemail.

## 2020-10-04 LAB — NOVEL CORONAVIRUS, NAA: SARS-CoV-2, NAA: NOT DETECTED

## 2020-10-04 LAB — SARS-COV-2, NAA 2 DAY TAT

## 2020-10-16 ENCOUNTER — Other Ambulatory Visit: Payer: Self-pay | Admitting: Family Medicine

## 2020-10-17 ENCOUNTER — Other Ambulatory Visit: Payer: Self-pay | Admitting: Orthopedic Surgery

## 2020-10-17 ENCOUNTER — Other Ambulatory Visit: Payer: Self-pay | Admitting: Family Medicine

## 2020-10-17 DIAGNOSIS — E1122 Type 2 diabetes mellitus with diabetic chronic kidney disease: Secondary | ICD-10-CM

## 2020-10-17 DIAGNOSIS — M545 Low back pain, unspecified: Secondary | ICD-10-CM

## 2020-10-20 DIAGNOSIS — Z992 Dependence on renal dialysis: Secondary | ICD-10-CM | POA: Diagnosis not present

## 2020-10-20 DIAGNOSIS — N186 End stage renal disease: Secondary | ICD-10-CM | POA: Diagnosis not present

## 2020-10-23 ENCOUNTER — Telehealth: Payer: Self-pay

## 2020-10-23 ENCOUNTER — Other Ambulatory Visit: Payer: Self-pay

## 2020-10-23 ENCOUNTER — Ambulatory Visit (INDEPENDENT_AMBULATORY_CARE_PROVIDER_SITE_OTHER): Payer: 59 | Admitting: Family Medicine

## 2020-10-23 VITALS — BP 130/67 | HR 71 | Resp 16 | Ht 64.0 in | Wt 200.4 lb

## 2020-10-23 DIAGNOSIS — E1159 Type 2 diabetes mellitus with other circulatory complications: Secondary | ICD-10-CM

## 2020-10-23 DIAGNOSIS — S90222A Contusion of left lesser toe(s) with damage to nail, initial encounter: Secondary | ICD-10-CM | POA: Diagnosis not present

## 2020-10-23 DIAGNOSIS — F5104 Psychophysiologic insomnia: Secondary | ICD-10-CM

## 2020-10-23 DIAGNOSIS — D472 Monoclonal gammopathy: Secondary | ICD-10-CM

## 2020-10-23 DIAGNOSIS — I152 Hypertension secondary to endocrine disorders: Secondary | ICD-10-CM

## 2020-10-23 DIAGNOSIS — F322 Major depressive disorder, single episode, severe without psychotic features: Secondary | ICD-10-CM | POA: Diagnosis not present

## 2020-10-23 DIAGNOSIS — E1122 Type 2 diabetes mellitus with diabetic chronic kidney disease: Secondary | ICD-10-CM

## 2020-10-23 DIAGNOSIS — Z992 Dependence on renal dialysis: Secondary | ICD-10-CM

## 2020-10-23 DIAGNOSIS — R2681 Unsteadiness on feet: Secondary | ICD-10-CM

## 2020-10-23 DIAGNOSIS — N186 End stage renal disease: Secondary | ICD-10-CM

## 2020-10-23 DIAGNOSIS — Z794 Long term (current) use of insulin: Secondary | ICD-10-CM

## 2020-10-23 MED ORDER — MELATONIN 3 MG PO TBDP
ORAL_TABLET | ORAL | 2 refills | Status: DC
Start: 2020-10-23 — End: 2021-11-06

## 2020-10-23 NOTE — Patient Instructions (Addendum)
Please reschedule August follow up to mid September  New medication , melatonin,  at bedtime for sleep and depression  We will get appt dates for your psychiatry and ENT referrals at checkout if possible, or follow up with a call to you please have voice mail available  The 2nd toenail has old/ dried blood under the nail.  Please use podiatry to cut toenalils and attend to your feet  Thanks for choosing [facility], we consider it a privelige to serve you.

## 2020-10-28 ENCOUNTER — Encounter: Payer: Self-pay | Admitting: Endocrinology

## 2020-10-28 ENCOUNTER — Other Ambulatory Visit: Payer: Self-pay

## 2020-10-28 ENCOUNTER — Encounter: Payer: Self-pay | Admitting: Family Medicine

## 2020-10-28 ENCOUNTER — Ambulatory Visit (INDEPENDENT_AMBULATORY_CARE_PROVIDER_SITE_OTHER): Payer: 59 | Admitting: Endocrinology

## 2020-10-28 ENCOUNTER — Telehealth: Payer: Self-pay | Admitting: Internal Medicine

## 2020-10-28 VITALS — BP 158/98 | HR 72 | Ht 64.0 in | Wt 201.0 lb

## 2020-10-28 DIAGNOSIS — E1165 Type 2 diabetes mellitus with hyperglycemia: Secondary | ICD-10-CM

## 2020-10-28 DIAGNOSIS — N186 End stage renal disease: Secondary | ICD-10-CM | POA: Diagnosis not present

## 2020-10-28 DIAGNOSIS — Z794 Long term (current) use of insulin: Secondary | ICD-10-CM | POA: Diagnosis not present

## 2020-10-28 DIAGNOSIS — E1122 Type 2 diabetes mellitus with diabetic chronic kidney disease: Secondary | ICD-10-CM | POA: Diagnosis not present

## 2020-10-28 DIAGNOSIS — G4700 Insomnia, unspecified: Secondary | ICD-10-CM | POA: Insufficient documentation

## 2020-10-28 DIAGNOSIS — S90222A Contusion of left lesser toe(s) with damage to nail, initial encounter: Secondary | ICD-10-CM | POA: Insufficient documentation

## 2020-10-28 LAB — POCT GLUCOSE (DEVICE FOR HOME USE): POC Glucose: 189 mg/dl — AB (ref 70–99)

## 2020-10-28 LAB — POCT GLYCOSYLATED HEMOGLOBIN (HGB A1C): Hemoglobin A1C: 9.1 % — AB (ref 4.0–5.6)

## 2020-10-28 MED ORDER — FREESTYLE LIBRE 14 DAY SENSOR MISC: 1.0000 | 0 refills | Status: DC

## 2020-10-28 NOTE — Assessment & Plan Note (Signed)
Uncontrolled, managed by Endo Ms. Catalfamo is reminded of the importance of commitment to daily physical activity for 30 minutes or more, as able and the need to limit carbohydrate intake to 30 to 60 grams per meal to help with blood sugar control.   The need to take medication as prescribed, test blood sugar as directed, and to call between visits if there is a concern that blood sugar is uncontrolled is also discussed.   Ms. Guilliams is reminded of the importance of daily foot exam, annual eye examination, and good blood sugar, blood pressure and cholesterol control.  Diabetic Labs Latest Ref Rng & Units 06/18/2020 02/28/2020 07/19/2019 07/17/2019 04/07/2019  HbA1c 0.0 - 7.0 % 8.4(A) 7.2(A) - 7.8 -  Microalbumin Not estab mg/dL - - - - -  Micro/Creat Ratio <30 mcg/mg creat - - - - -  Chol 0 - 200 mg/dL - - - - -  HDL >40 mg/dL - - - - -  Calc LDL 0 - 99 mg/dL - - - - -  Triglycerides <150 mg/dL - - - - -  Creatinine 0.44 - 1.00 mg/dL - - 5.96(H) 8.1(A) 7.68(H)   BP/Weight 10/23/2020 09/24/2020 06/18/2020 02/28/2020 11/23/2019 7/37/1062 08/29/4852  Systolic BP 627 035 009 381 829 937 169  Diastolic BP 67 72 66 67 65 71 74  Wt. (Lbs) 200.4 197 193.4 190.04 - - 194.01  BMI 34.4 33.81 33.2 32.62 - 33.3 33.3   Foot/eye exam completion dates Latest Ref Rng & Units 08/07/2020 02/28/2020  Eye Exam No Retinopathy No Retinopathy -  Foot Form Completion - - Done

## 2020-10-28 NOTE — Assessment & Plan Note (Signed)
Re evaluation past due refer to Oncology

## 2020-10-28 NOTE — Assessment & Plan Note (Signed)
Score of 18 in 10/2020, follow through on therapy appt

## 2020-10-28 NOTE — Telephone Encounter (Signed)
Patient daughter [name] requests to be called at ph#  [phone] re: Patient's bs were over 500 on 10/26/20-on 10/27/20 bs=read high and dropped down to 417. This morning bs=41. Request Patient be seen asap.

## 2020-10-28 NOTE — Progress Notes (Signed)
Patient ID: Kathryn Beck, female   DOB: 02-17-1956, 65 y.o.   MRN: [id number]           Reason for Appointment: Type II Diabetes follow-up   History of Present Illness    Recent history:     Non-insulin hypoglycemic drugs: None     Insulin regimen:      Toujeo 60 units at night daily, HUMALOG 15 to 32 units before meals  A1C = 9.1, previously 8.4  Current self management, blood sugar patterns and problems identified:  She has not been seen for follow-up since 2/21  She called because of her blood sugars being quite erratic recently  Her chart indicates she was supposed to take Toujeo 3 days a week but she is taking that every day, 60 units and has not changed the dose in a while She thinks she is taking Humalog based on Premeal blood sugar However if the blood sugars are near normal she will only take only about 15 units If the blood sugar is high she will take up to about 32 units Humalog She may sometimes take the insulin right after eating She has very little recall of how her blood sugars are doing and currently using her husband's monitor She had a freestyle libre sensor use previously but she ran out of sensors and has not checked any blood sugars for over a month  Diet management: She will sometimes drink sweet tea      Monitors blood glucose: Once a day.    Glucometer:?         Blood Glucose readings from: Patient recall:   PRE-MEAL Fasting Lunch Dinner Bedtime Overall  Glucose range: 41-127  Low-400+ Up to 400   Mean/median:     ?     Hypoglycemia: Every 3 to 4 days                        Dietician visit: Most recent:    ?  Weight control:  Wt Readings from Last 3 Encounters:  10/28/20 201 lb (91.2 kg)  10/23/20 200 lb 6.4 oz (90.9 kg)  09/24/20 197 lb (89.4 kg)          Complications:   ESRD  Diabetes labs:  Lab Results  Component Value Date   HGBA1C 9.1 (A) 10/28/2020   HGBA1C 8.4 (A) 06/18/2020   HGBA1C 7.2 (A) 02/28/2020   Lab Results   Component Value Date   MICROALBUR 75.9 02/28/2016   LDLCALC 114 (H) 03/08/2018   CREATININE 5.96 (H) 07/19/2019     Allergies as of 10/28/2020       Reactions   Ace Inhibitors Anaphylaxis, Swelling   Penicillins Itching, Swelling, Other (See Comments)   Did it involve swelling of the face/tongue/throat, SOB, or low BP? Unknown Did it involve sudden or severe rash/hives, skin peeling, or any reaction on the inside of your mouth or nose? Unknown Did you need to seek medical attention at a hospital or doctor's office? Unknown When did it last happen?      years  If all above answers are "NO", may proceed with cephalosporin use.   Statins Other (See Comments)   elevated LFT's   Albuterol Swelling        Medication List        Accurate as of October 28, 2020  2:10 PM. If you have any questions, ask your nurse or doctor.  amLODipine 5 MG tablet Commonly known as: NORVASC TAKE 1 TABLET BY MOUTH ONCE DAILY.   aspirin 81 MG chewable tablet CHEW 2 TABLETS BY MOUTH ONCE DAILY   calcitRIOL 0.25 MCG capsule Commonly known as: ROCALTROL Take 0.25 capsules by mouth.   cloNIDine 0.1 MG tablet Commonly known as: CATAPRES Take 0.1 mg by mouth 2 (two) times daily.   cyclobenzaprine 5 MG tablet Commonly known as: FLEXERIL TAKE ONE TABLET BY MOUTH AT BEDTIME AS NEEDED FOR MUSCLE SPASMS.   DULoxetine 60 MG capsule Commonly known as: CYMBALTA TAKE (1) CAPSULE BY MOUTH ONCE DAILY.   ezetimibe 10 MG tablet Commonly known as: ZETIA TAKE 1 TABLET BY MOUTH ONCE DAILY.   FreeStyle Libre 14 Day Sensor Misc 1 each by Does not apply route every 14 (fourteen) days. Change every 2 weeks   HumaLOG KwikPen 200 UNIT/ML KwikPen Generic drug: insulin lispro INJECT 24-28 UNITS S.Q. THREE TIMES A DAY BEFORE MEALS.   HYDROcodone-acetaminophen 5-325 MG tablet Commonly known as: NORCO/VICODIN Take 1 tablet by mouth every 6 (six) hours as needed for moderate pain.   isosorbide  mononitrate 30 MG 24 hr tablet Commonly known as: IMDUR TAKE 1 TABLET BY MOUTH ONCE DAILY.   Melatonin 3 MG Tbdp Take one tablet by mouth at bedtime , for sleep   metoprolol succinate 50 MG 24 hr tablet Commonly known as: TOPROL-XL TAKE (1) TABLET BY MOUTH DAILY WITH FOOD   midodrine 10 MG tablet Commonly known as: PROAMATINE TAKE 1 TABLET BY MOUTH TWICE DAILY   ondansetron 4 MG tablet Commonly known as: Zofran Take 1 tablet (4 mg total) by mouth every 8 (eight) hours as needed for nausea or vomiting.   pregabalin 50 MG capsule Commonly known as: LYRICA TAKE (1) CAPSULE BY MOUTH THREE TIMES DAILY   sertraline 50 MG tablet Commonly known as: ZOLOFT TAKE 1 TABLET BY MOUTH ONCE DAILY.   sertraline 100 MG tablet Commonly known as: ZOLOFT TAKE 1 TABLET BY MOUTH EVERY DAY   torsemide 100 MG tablet Commonly known as: DEMADEX TAKE 1 TABLET BY MOUTH TWICE DAILY   Toujeo Max SoloStar 300 UNIT/ML Solostar Pen Generic drug: insulin glargine (2 Unit Dial) Inject 50-56 Units into the skin See admin instructions. Take after dialysis 3 day a week.   triamcinolone cream 0.1 % Commonly known as: KENALOG Apply 1 application topically 2 (two) times daily.   UltiCare Mini Pen Needles 31G X 6 MM Misc Generic drug: Insulin Pen Needle USE AS DIRECTED   [name] 500 MG chewable tablet Generic drug: sucroferric oxyhydroxide Chew 500 mg by mouth See admin instructions. Take 500 mg with each snack and meal   zolpidem 5 MG tablet Commonly known as: AMBIEN TAKE 1 TABLET BY MOUTH AT BEDTIME AS NEEDED.        Allergies:  Allergies  Allergen Reactions   Ace Inhibitors Anaphylaxis and Swelling   Penicillins Itching, Swelling and Other (See Comments)    Did it involve swelling of the face/tongue/throat, SOB, or low BP? Unknown Did it involve sudden or severe rash/hives, skin peeling, or any reaction on the inside of your mouth or nose? Unknown Did you need to seek medical attention at  a hospital or doctor's office? Unknown When did it last happen?      years  If all above answers are "NO", may proceed with cephalosporin use.    Statins Other (See Comments)    elevated LFT's     Albuterol Swelling    Past Medical  History:  Diagnosis Date   Acid reflux    Anemia    Arthritis    Axillary masses    Soft tissue - status post excision   Back pain    COVID-19 virus infection 04/06/2019   Depression    End-stage renal disease (HCC)    [facility]Sat  in [place] hypertension    History of blood transfusion    History of cardiac catheterization    Normal coronary arteries October 2020   History of claustrophobia    History of pneumonia 2019   Hypoxia 04/03/2019   Mixed hyperlipidemia    Obesity    Pancreatitis    Peritoneal dialysis catheter in place [facility])    Pneumonia due to COVID-19 virus 04/02/2019   Sleep apnea    Noncompliant with CPAP   Type 2 diabetes mellitus ([place])     Past Surgical History:  Procedure Laterality Date   ABDOMINAL HYSTERECTOMY     AV FISTULA PLACEMENT Left 09/02/2017   Procedure: creation of left arm ARTERIOVENOUS (AV) FISTULA;  Surgeon: Serafina Mitchell, MD;  Location: [place];  Service: Vascular;  Laterality: Left;   COLONOSCOPY  2008   Dr. Oneida Alar: normal    COLONOSCOPY N/A 12/18/2016   Dr. Oneida Alar: multiple tubular adenomas, internal hemorrhoids. Surveillance in 3 years    ESOPHAGEAL DILATION N/A 10/13/2015   Procedure: ESOPHAGEAL DILATION;  Surgeon: Rogene Houston, MD;  [facility];  Service: Endoscopy;  Laterality: N/A;   ESOPHAGOGASTRODUODENOSCOPY N/A 10/13/2015   Dr. Laural Golden: chronic gastritis on path, no H.pylori. Empiric dilation    ESOPHAGOGASTRODUODENOSCOPY N/A 12/18/2016   Dr. Oneida Alar: mild gastritis. BRAVO study revealed uncontrolled GERD. Dysphagia secondary to uncontrolled reflux   FOOT SURGERY Bilateral    "nerve"     LEFT HEART CATH AND CORONARY ANGIOGRAPHY N/A 12/29/2018   Procedure: LEFT HEART CATH AND  CORONARY ANGIOGRAPHY;  Surgeon: Jettie Booze, MD;  Location: [place] [facility];  Service: Cardiovascular;  Laterality: N/A;   LUNG BIOPSY     MASS EXCISION Right 01/09/2013   Procedure: EXCISION OF NEOPLASM OF RIGHT  AXILLA  AND EXCISION OF NEOPLASM OF LEFT AXILLA;  Surgeon: Jamesetta So, MD;  Location: [facility];  Service: General;  Laterality: Right;  procedure end @ 08:23   MYRINGOTOMY WITH TUBE PLACEMENT Bilateral 04/28/2017   Procedure: BILATERAL MYRINGOTOMY WITH TUBE PLACEMENT;  Surgeon: Leta Baptist, MD;  Location: [place];  Service: ENT;  Laterality: Bilateral;   REVISION OF ARTERIOVENOUS GORETEX GRAFT Left 05/04/2018   Procedure: TRANSPOSITION OF CEPHALIC VEIN ARTERIOVENOUS FISTULA LEFT ARM;  Surgeon: Rosetta Posner, MD;  Location: [place];  Service: Vascular;  Laterality: Left;   SAVORY DILATION N/A 12/18/2016   Procedure: SAVORY DILATION;  Surgeon: Danie Binder, MD;  [facility];  Service: Endoscopy;  Laterality: N/A;    Family History  Problem Relation Age of Onset   Hypertension Father    Hypercholesterolemia Father    Arthritis Father    Hypertension Sister    Hypercholesterolemia Sister    Breast cancer Sister    Hypertension Sister    Colon cancer Neg Hx    Colon polyps Neg Hx     Social History:  reports that she has never smoked. She has never used smokeless tobacco. She reports that she does not drink alcohol and does not use drugs.  Review of Systems:  She is on treatment for hypertension from her nephrologist with amlodipine and Catapres  BP Readings from Last 3 Encounters:  10/28/20 (!) 158/98  10/23/20 130/67  09/24/20 (!) 157/72      Examination:   BP (!) 158/98   Pulse 72   Ht 5\' 4"  (1.626 m)   Wt 201 lb (91.2 kg)   SpO2 96%   BMI 34.50 kg/m   Body mass index is 34.5 kg/m.    ASSESSMENT/ PLAN:    Diabetes type 2 insulin-dependent:   Blood glucose control is poor She appears to be somewhat insulin resistant  Although she is on  basal bolus insulin regimen difficult to assess her level of control because of not having patient's glucose records or CGM She appears to have very erratic blood sugars and not clear what is causing the variability Likely is taking inadequate insulin when blood sugars are near normal  Discussed need for regular follow-up, timing of taking Humalog before every meal and more consistent glucose monitoring  Prescription for Freestyle libre sensors has been sent but she will need to check blood sugars several times a day to get adequate patterns Since she has had some overnight hypoglycemia she will reduce her Toujeo to 54 units every night  Have given her another set of instructions for Humalog She will take at least 24 units with meals but generally needs to take a base amount of 28-34 units with additional for high readings Although she may benefit from a closed-loop insulin pump unlikely she can be trained on this   She will need to follow-up as soon as possible  Patient Instructions  - Humalog take at least 24 units with meals  28 units before a smaller meal 34 units before a larger meal Take Humalog EVERY TIME you eat  - Humalog sliding scale: - 150-175: + 1 unit - 176-200: + 2 units - 201-225: + 3 units - 226-250: + 4 units - 251-275: + 5 units - 276-300: + 6 units    TOUJEO 54 UNITS EVERY NITE  Start Sensor  No sweet tea   Elayne Snare 10/28/2020, 2:10 PM

## 2020-10-28 NOTE — Patient Instructions (Addendum)
-   Humalog take at least 24 units with meals  28 units before a smaller meal 34 units before a larger meal Take Humalog EVERY TIME you eat  - Humalog sliding scale: - 150-175: + 1 unit - 176-200: + 2 units - 201-225: + 3 units - 226-250: + 4 units - 251-275: + 5 units - 276-300: + 6 units    TOUJEO 54 UNITS EVERY NITE  Start Sensor  No sweet tea

## 2020-10-28 NOTE — Assessment & Plan Note (Signed)
Home safety reviewed, subungual hematoma due to imbalance with trauma

## 2020-10-28 NOTE — Assessment & Plan Note (Signed)
Reassured pt that there will be resolution in 4 to 6 weeks, continue to monitor, will re address at next visit

## 2020-10-28 NOTE — Assessment & Plan Note (Signed)
Controlled, no change in medication  

## 2020-10-28 NOTE — Assessment & Plan Note (Signed)
Tolerating well

## 2020-10-28 NOTE — Assessment & Plan Note (Signed)
Sleep hygiene reviewed and written information offered also. Prescription sent for  medication needed. Start bedtime melatonin

## 2020-10-28 NOTE — Progress Notes (Signed)
Kathryn Beck     MRN: [id number]      DOB: 31-May-1955   HPI [name] is here with c/o left second toenail and she is aware that she did hit the toe some weeks back.  She is also complaining of ongoing vertigo and is awaiting an appointment with ENT.  She also has been referred to psychiatry therapist and still has not had a call. She does go to dialysis and several attempts to get in touch with her her voicemail has been full unable to leave messages we will try to address the situation today before she leaves.  Blood sugar fluctuates and is not at goal when last checked Denies polyuria, polydipsia, blurred vision , or hypoglycemic episodes.   ROS Denies recent fever or chills. Denies sinus pressure, nasal congestion, ear pain or sore throat. Denies chest congestion, productive cough or wheezing. Denies chest pains, palpitations and leg swelling Denies abdominal pain, nausea, vomiting,diarrhea or constipation.   Chronic  joint pain,  and limitation in mobility. Denies headaches, seizures, C/o  depression, anxiety and  insomnia. Denies skin break down or rash.   PE  BP 130/67   Pulse 71   Resp 16   Ht 5\' 4"  (1.626 m)   Wt 200 lb 6.4 oz (90.9 kg)   SpO2 93%   BMI 34.40 kg/m   Patient alert and oriented and in no cardiopulmonary distress.  HEENT: No facial asymmetry, EOMI,     Neck supple .  Chest: Clear to auscultation bilaterally.  CVS: S1, S2 no murmurs, no S3.Regular rate.  ABD: Soft non tender.   Ext: No edema  MS: Adequate  though reduced ROM spine, shoulders, hips and knees.  Skin: Intact, old blood under left 2nd toe, non tender , full ROM of toe  Psych: Good eye contact, normal affect. Memory intact not anxious or depressed appearing.  CNS: CN 2-12 intact, power,  normal throughout.no focal deficits noted.   Assessment & Plan  Depression, major, single episode, severe (HCC) Score of 18 in 10/2020, follow through on therapy appt  ESRD on  hemodialysis ([place]) Tolerating well  Hypertension associated with diabetes ([place]) Controlled, no change in medication   Unsteady gait Home safety reviewed, subungual hematoma due to imbalance with trauma  Subungual hematoma of second toe of left foot Reassured pt that there will be resolution in 4 to 6 weeks, continue to monitor, will re address at next visit  MGUS (monoclonal gammopathy of unknown significance) Re evaluation past due refer to Oncology  Insomnia Sleep hygiene reviewed and written information offered also. Prescription sent for  medication needed. Start bedtime melatonin  Diabetes mellitus ([place]) Uncontrolled, managed by Endo [name] is reminded of the importance of commitment to daily physical activity for 30 minutes or more, as able and the need to limit carbohydrate intake to 30 to 60 grams per meal to help with blood sugar control.   The need to take medication as prescribed, test blood sugar as directed, and to call between visits if there is a concern that blood sugar is uncontrolled is also discussed.   [name] is reminded of the importance of daily foot exam, annual eye examination, and good blood sugar, blood pressure and cholesterol control.  Diabetic Labs Latest Ref Rng & Units 06/18/2020 02/28/2020 07/19/2019 07/17/2019 04/07/2019  HbA1c 0.0 - 7.0 % 8.4(A) 7.2(A) - 7.8 -  Microalbumin Not estab mg/dL - - - - -  Micro/Creat Ratio <30 mcg/mg creat - - - - -  Chol 0 - 200 mg/dL - - - - -  HDL >40 mg/dL - - - - -  Calc LDL 0 - 99 mg/dL - - - - -  Triglycerides <150 mg/dL - - - - -  Creatinine 0.44 - 1.00 mg/dL - - 5.96(H) 8.1(A) 7.68(H)   BP/Weight 10/23/2020 09/24/2020 06/18/2020 02/28/2020 11/23/2019 9/64/3838 03/31/4035  Systolic BP 543 606 770 340 352 481 859  Diastolic BP 67 72 66 67 65 71 74  Wt. (Lbs) 200.4 197 193.4 190.04 - - 194.01  BMI 34.4 33.81 33.2 32.62 - 33.3 33.3   Foot/eye exam completion dates Latest Ref Rng & Units 08/07/2020 02/28/2020   Eye Exam No Retinopathy No Retinopathy -  Foot Form Completion - - Done

## 2020-10-31 ENCOUNTER — Ambulatory Visit: Payer: 59 | Admitting: Licensed Clinical Social Worker

## 2020-10-31 ENCOUNTER — Other Ambulatory Visit: Payer: Self-pay

## 2020-10-31 ENCOUNTER — Other Ambulatory Visit: Payer: Self-pay | Admitting: Internal Medicine

## 2020-10-31 ENCOUNTER — Telehealth (INDEPENDENT_AMBULATORY_CARE_PROVIDER_SITE_OTHER): Payer: 59 | Admitting: Licensed Clinical Social Worker

## 2020-10-31 DIAGNOSIS — F322 Major depressive disorder, single episode, severe without psychotic features: Secondary | ICD-10-CM

## 2020-10-31 DIAGNOSIS — L309 Dermatitis, unspecified: Secondary | ICD-10-CM

## 2020-10-31 NOTE — BH Specialist Note (Signed)
[place] Initial Clinical Assessment  MRN: [id number] NAME: CARYN GIENGER Date: 10/31/20  Start time:   1pEnd time:  120p Total time:  20 min Call number:  video  Patient location: office Clinician location: home office  Type of Contact:  video Patient consent obtained:  yes Reason for Visit today:  begin [facility] service  Treatment History Patient recently received Inpatient Treatment:  no  Facility/Program:    Date of discharge:   Patient currently being seen by therapist/psychiatrist:   Patient currently receiving the following services:    Past Psychiatric History/Hospitalization(s): Anxiety: No Bipolar Disorder: No Depression: No Mania: No Psychosis: No Schizophrenia: No Personality Disorder: No Hospitalization for psychiatric illness: No History of Electroconvulsive Shock Therapy: No Prior Suicide Attempts: No  Clinical Assessment:  PHQ-9 Assessments: Depression screen [facility] 2/9 10/23/2020 09/24/2020 08/29/2020  Decreased Interest 3 3 0  Down, Depressed, Hopeless 2 3 2   PHQ - 2 Score 5 6 2   Altered sleeping 3 3 3   Tired, decreased energy 3 3 3   Change in appetite 2 3 3   Feeling bad or failure about yourself  2 1 0  Trouble concentrating 1 3 0  Moving slowly or fidgety/restless 2 0 0  Suicidal thoughts 0 0 0  PHQ-9 Score 18 19 11   Difficult doing work/chores - Very difficult Somewhat difficult  Some recent data might be hidden    GAD-7 Assessments: GAD 7 : Generalized Anxiety Score 10/03/2019 08/09/2019 08/22/2018 07/25/2018  Nervous, Anxious, on Edge 1 0 1 1  Control/stop worrying 0 0 1 1  Worry too much - different things 0 0 0 1  Trouble relaxing 0 0 0 1  Restless 0 0 0 0  Easily annoyed or irritable 1 0 0 0  Afraid - awful might happen 0 0 1 0  Total GAD 7 Score 2 0 3 4  Anxiety Difficulty Not difficult at all Not difficult at all Not difficult at all Not difficult at all     Social Functioning Social maturity:  WNL Social judgement:   WNL  Stress Current stressors:  health Familial stressors:  denies Sleep:  fair Appetite:  fair Coping ability:  overwhelmed Patient taking medications as prescribed:  yes  Current medications:  Outpatient Encounter Medications as of 10/31/2020  Medication Sig   amLODipine (NORVASC) 5 MG tablet TAKE 1 TABLET BY MOUTH ONCE DAILY.   aspirin 81 MG chewable tablet CHEW 2 TABLETS BY MOUTH ONCE DAILY   calcitRIOL (ROCALTROL) 0.25 MCG capsule Take 0.25 capsules by mouth.   cloNIDine (CATAPRES) 0.1 MG tablet Take 0.1 mg by mouth 2 (two) times daily.   Continuous Blood Gluc Sensor (FREESTYLE LIBRE 14 DAY SENSOR) MISC 1 each by Does not apply route every 14 (fourteen) days. Change every 2 weeks   cyclobenzaprine (FLEXERIL) 5 MG tablet TAKE ONE TABLET BY MOUTH AT BEDTIME AS NEEDED FOR MUSCLE SPASMS.   DULoxetine (CYMBALTA) 60 MG capsule TAKE (1) CAPSULE BY MOUTH ONCE DAILY.   ezetimibe (ZETIA) 10 MG tablet TAKE 1 TABLET BY MOUTH ONCE DAILY.   HUMALOG KWIKPEN 200 UNIT/ML KwikPen INJECT 24-28 UNITS S.Q. THREE TIMES A DAY BEFORE MEALS.   HYDROcodone-acetaminophen (NORCO/VICODIN) 5-325 MG tablet Take 1 tablet by mouth every 6 (six) hours as needed for moderate pain.   Insulin Glargine, 2 Unit Dial, (TOUJEO MAX SOLOSTAR) 300 UNIT/ML SOPN Inject 50-56 Units into the skin See admin instructions. Take after dialysis 3 day a week.   isosorbide mononitrate (IMDUR) 30 MG  24 hr tablet TAKE 1 TABLET BY MOUTH ONCE DAILY.   Melatonin 3 MG TBDP Take one tablet by mouth at bedtime , for sleep   metoprolol succinate (TOPROL-XL) 50 MG 24 hr tablet TAKE (1) TABLET BY MOUTH DAILY WITH FOOD   midodrine (PROAMATINE) 10 MG tablet TAKE 1 TABLET BY MOUTH TWICE DAILY   ondansetron (ZOFRAN) 4 MG tablet Take 1 tablet (4 mg total) by mouth every 8 (eight) hours as needed for nausea or vomiting.   pregabalin (LYRICA) 50 MG capsule TAKE (1) CAPSULE BY MOUTH THREE TIMES DAILY   sertraline (ZOLOFT) 100 MG tablet TAKE 1 TABLET BY  MOUTH EVERY DAY   sertraline (ZOLOFT) 50 MG tablet TAKE 1 TABLET BY MOUTH ONCE DAILY.   torsemide (DEMADEX) 100 MG tablet TAKE 1 TABLET BY MOUTH TWICE DAILY   triamcinolone (KENALOG) 0.1 % Apply 1 application topically 2 (two) times daily.   ULTICARE MINI PEN NEEDLES 31G X 6 MM MISC USE AS DIRECTED   [name] 500 MG chewable tablet Chew 500 mg by mouth See admin instructions. Take 500 mg with each snack and meal   zolpidem (AMBIEN) 5 MG tablet TAKE 1 TABLET BY MOUTH AT BEDTIME AS NEEDED.   [DISCONTINUED] FLUoxetine (PROZAC) 10 MG capsule Take 10 mg by mouth daily.     [DISCONTINUED] glipiZIDE (GLUCOTROL) 10 MG tablet Take 10 mg by mouth 2 (two) times daily before a meal.     Facility-Administered Encounter Medications as of 10/31/2020  Medication   0.9 %  sodium chloride infusion    Self-harm Behaviors Risk Assessment Self-harm risk factors:   Patient endorses recent thoughts of harming self:    [place] Suicide Severity Rating Scale:  C-SRSS 04/22/2018 05/03/2018 05/27/2018 05/27/2018 06/28/2018 Jul 26, 2018 23-Aug-2018  1. Wish to be Dead No No No No No No No  2. Suicidal Thoughts No No No No No No No  6. Suicide Behavior Question No No No No No No No    Danger to Others Risk Assessment Danger to others risk factors:   Patient endorses recent thoughts of harming others:    Dynamic Appraisal of Situational Aggression (DASA): No flowsheet data found.  Substance Use Assessment Patient recently consumed alcohol:    Alcohol Use Disorder Identification Test (AUDIT):  Alcohol Use Disorder Test (AUDIT) 03/08/2018 06/28/2018 07-26-18 08-23-18 09/25/2018 10/03/2019 10/03/2019  1. How often do you have a drink containing alcohol? 0 0 0 0 0 0 0  2. How many drinks containing alcohol do you have on a typical day when you are drinking? 0 0 0 0 - 0 0  3. How often do you have six or more drinks on one occasion? 0 0 0 0 0 0 0  AUDIT-C Score 0 0 0 0 - 0 0  Alcohol Brief Interventions/Follow-up AUDIT Score <7  follow-up not indicated AUDIT Score <7 follow-up not indicated AUDIT Score <7 follow-up not indicated AUDIT Score <7 follow-up not indicated - - -   Patient recently used drugs:    Opioid Risk Assessment:  Patient is concerned about dependence or abuse of substances:    ASAM Multidimensional Assessment Summary:  Dimension 1:    Dimension 1 Rating:    Dimension 2:    Dimension 2 Rating:    Dimension 3:    Dimension 3 Rating:    Dimension 4:    Dimension 4 Rating:    Dimension 5:    Dimension 5 Rating:    Dimension 6:    Dimension 6 Rating:  ASAM's Severity Rating Score:   ASAM Recommended Level of Treatment:     Goals, Interventions and Follow-up Plan Goals: Increase healthy adjustment to current life circumstances Interventions: Solution-Focused Strategies Follow-up Plan:  Weekly VBH sessions  Summary of Clinical Assessment Summary: Kynli presented today with her daughter to begin [facility] service.  She reports that she has recently been diagnosed with several medical concerns that have caused poor mood.  She is currently on dialysis three times per week.  She has supportive family such as her husband, daughter and sister. She is unable to drive due to feeling light headed often.  She has sadness, crying spells, lack of motivation, poor energy, worried and on edge daily.   Lubertha South, LCSW

## 2020-10-31 NOTE — Telephone Encounter (Signed)
Close note

## 2020-11-01 DIAGNOSIS — R0602 Shortness of breath: Secondary | ICD-10-CM | POA: Diagnosis not present

## 2020-11-01 DIAGNOSIS — Z01810 Encounter for preprocedural cardiovascular examination: Secondary | ICD-10-CM | POA: Diagnosis not present

## 2020-11-04 ENCOUNTER — Other Ambulatory Visit (HOSPITAL_COMMUNITY): Payer: Self-pay

## 2020-11-04 DIAGNOSIS — D638 Anemia in other chronic diseases classified elsewhere: Secondary | ICD-10-CM

## 2020-11-04 DIAGNOSIS — D472 Monoclonal gammopathy: Secondary | ICD-10-CM

## 2020-11-05 ENCOUNTER — Ambulatory Visit (INDEPENDENT_AMBULATORY_CARE_PROVIDER_SITE_OTHER): Payer: 59 | Admitting: Licensed Clinical Social Worker

## 2020-11-05 ENCOUNTER — Other Ambulatory Visit: Payer: Self-pay

## 2020-11-05 ENCOUNTER — Inpatient Hospital Stay (HOSPITAL_COMMUNITY): Payer: Medicare Other | Attending: Hematology

## 2020-11-05 DIAGNOSIS — G894 Chronic pain syndrome: Secondary | ICD-10-CM | POA: Diagnosis not present

## 2020-11-05 DIAGNOSIS — Z8639 Personal history of other endocrine, nutritional and metabolic disease: Secondary | ICD-10-CM | POA: Insufficient documentation

## 2020-11-05 DIAGNOSIS — D638 Anemia in other chronic diseases classified elsewhere: Secondary | ICD-10-CM

## 2020-11-05 DIAGNOSIS — R296 Repeated falls: Secondary | ICD-10-CM | POA: Diagnosis not present

## 2020-11-05 DIAGNOSIS — E1142 Type 2 diabetes mellitus with diabetic polyneuropathy: Secondary | ICD-10-CM | POA: Diagnosis not present

## 2020-11-05 DIAGNOSIS — R2689 Other abnormalities of gait and mobility: Secondary | ICD-10-CM | POA: Diagnosis not present

## 2020-11-05 DIAGNOSIS — R55 Syncope and collapse: Secondary | ICD-10-CM | POA: Diagnosis not present

## 2020-11-05 DIAGNOSIS — I6930 Unspecified sequelae of cerebral infarction: Secondary | ICD-10-CM | POA: Diagnosis not present

## 2020-11-05 DIAGNOSIS — F322 Major depressive disorder, single episode, severe without psychotic features: Secondary | ICD-10-CM

## 2020-11-05 DIAGNOSIS — D472 Monoclonal gammopathy: Secondary | ICD-10-CM

## 2020-11-05 DIAGNOSIS — G5603 Carpal tunnel syndrome, bilateral upper limbs: Secondary | ICD-10-CM | POA: Diagnosis not present

## 2020-11-05 DIAGNOSIS — Z79899 Other long term (current) drug therapy: Secondary | ICD-10-CM | POA: Diagnosis not present

## 2020-11-05 LAB — COMPREHENSIVE METABOLIC PANEL
ALT: 45 U/L — ABNORMAL HIGH (ref 0–44)
AST: 23 U/L (ref 15–41)
Albumin: 3.6 g/dL (ref 3.5–5.0)
Alkaline Phosphatase: 69 U/L (ref 38–126)
Anion gap: 13 (ref 5–15)
BUN: 54 mg/dL — ABNORMAL HIGH (ref 8–23)
CO2: 23 mmol/L (ref 22–32)
Calcium: 8.6 mg/dL — ABNORMAL LOW (ref 8.9–10.3)
Chloride: 100 mmol/L (ref 98–111)
Creatinine, Ser: 7.69 mg/dL — ABNORMAL HIGH (ref 0.44–1.00)
GFR, Estimated: 5 mL/min — ABNORMAL LOW (ref >60)
Glucose, Bld: 253 mg/dL — ABNORMAL HIGH (ref 70–99)
Potassium: 4.7 mmol/L (ref 3.5–5.1)
Sodium: 136 mmol/L (ref 135–145)
Total Bilirubin: 0.7 mg/dL (ref 0.3–1.2)
Total Protein: 6.7 g/dL (ref 6.5–8.1)

## 2020-11-05 LAB — CBC WITH DIFFERENTIAL/PLATELET
Abs Immature Granulocytes: 0.04 10*3/uL (ref 0.00–0.07)
Basophils Absolute: 0 10*3/uL (ref 0.0–0.1)
Basophils Relative: 0 %
Eosinophils Absolute: 0.1 10*3/uL (ref 0.0–0.5)
Eosinophils Relative: 1 %
HCT: 34.9 % — ABNORMAL LOW (ref 36.0–46.0)
Hemoglobin: 10.7 g/dL — ABNORMAL LOW (ref 12.0–15.0)
Immature Granulocytes: 1 %
Lymphocytes Relative: 23 %
Lymphs Abs: 1.5 10*3/uL (ref 0.7–4.0)
MCH: 25.9 pg — ABNORMAL LOW (ref 26.0–34.0)
MCHC: 30.7 g/dL (ref 30.0–36.0)
MCV: 84.5 fL (ref 80.0–100.0)
Monocytes Absolute: 0.5 10*3/uL (ref 0.1–1.0)
Monocytes Relative: 7 %
Neutro Abs: 4.7 10*3/uL (ref 1.7–7.7)
Neutrophils Relative %: 68 %
Platelets: 243 10*3/uL (ref 150–400)
RBC: 4.13 MIL/uL (ref 3.87–5.11)
RDW: 18.4 % — ABNORMAL HIGH (ref 11.5–15.5)
WBC: 6.8 10*3/uL (ref 4.0–10.5)
nRBC: 0 % (ref 0.0–0.2)

## 2020-11-05 LAB — IRON AND TIBC
Iron: 127 ug/dL (ref 28–170)
Saturation Ratios: 53 % — ABNORMAL HIGH (ref 10.4–31.8)
TIBC: 239 ug/dL — ABNORMAL LOW (ref 250–450)
UIBC: 112 ug/dL

## 2020-11-05 LAB — FERRITIN: Ferritin: 1360 ng/mL — ABNORMAL HIGH (ref 11–307)

## 2020-11-05 LAB — LACTATE DEHYDROGENASE: LDH: 347 U/L — ABNORMAL HIGH (ref 98–192)

## 2020-11-05 LAB — MAGNESIUM: Magnesium: 2.2 mg/dL (ref 1.7–2.4)

## 2020-11-05 NOTE — Progress Notes (Signed)
Kathryn Beck requested a "call back, I am on the way to a doctor's appointment." Will reschedule

## 2020-11-06 ENCOUNTER — Telehealth: Payer: Self-pay | Admitting: Licensed Clinical Social Worker

## 2020-11-06 ENCOUNTER — Ambulatory Visit: Payer: 59 | Admitting: Family Medicine

## 2020-11-06 DIAGNOSIS — F322 Major depressive disorder, single episode, severe without psychotic features: Secondary | ICD-10-CM

## 2020-11-06 LAB — KAPPA/LAMBDA LIGHT CHAINS
Kappa free light chain: 158.3 mg/L — ABNORMAL HIGH (ref 3.3–19.4)
Kappa, lambda light chain ratio: 1.82 — ABNORMAL HIGH (ref 0.26–1.65)
Lambda free light chains: 86.8 mg/L — ABNORMAL HIGH (ref 5.7–26.3)

## 2020-11-06 NOTE — BH Specialist Note (Signed)
Virtual Behavioral Health Treatment Plan Team Note  MRN: [id number] NAME: Kathryn Beck  DATE: 11/06/20  Start time:   330pEnd time:  335p Total time:  5 min  Total number of Virtual [place] Treatment Team Plan encounters: 1/4  Treatment Team Attendees: Royal Piedra, LCSW, Dr. Modesta Messing, Psychiatrist  Diagnoses: No diagnosis found.  Goals, Interventions and Follow-up Plan Goals: Increase healthy adjustment to current life circumstances Interventions: Solution-Focused Strategies Medication Management Recommendations: consider consolidating to 1 antidepressant she is currently on Sertraline and Duloxetine Follow-up Plan: Weekly VBH sessions  History of the present illness Presenting Problem/Current Symptoms: continued symptoms of her diagnosis  Psychiatric History  Depression: No Anxiety: No Mania: No Psychosis: No PTSD symptoms: No  Past Psychiatric History/Hospitalization(s): Hospitalization for psychiatric illness: No Prior Suicide Attempts: No Prior Self-injurious behavior: No  Psychosocial stressors Flowsheet Row [facility] Phone Follow Up from 08/22/2018 in [facility]  Current Stressors --  [None Reported]  Familial Stressors None  Sleep No problems  Appetite No problems  Coping ability Normal  Patient taking medications as prescribed Yes       Self-harm Behaviors Risk Assessment Flowsheet Row Virtual BH Phone Follow Up from 08/22/2018 in [facility]  Self-harm risk factors --  [None Reported]  Have you recently had any thoughts about harming yourself? No       Screenings PHQ-9 Assessments:  Depression screen [facility] 2/9 10/23/2020 09/24/2020 08/29/2020  Decreased Interest 3 3 0  Down, Depressed, Hopeless 2 3 2   PHQ - 2 Score 5 6 2   Altered sleeping 3 3 3   Tired, decreased energy 3 3 3   Change in appetite 2 3 3   Feeling bad or failure about yourself  2 1 0  Trouble concentrating 1 3 0  Moving slowly or fidgety/restless 2 0 0  Suicidal  thoughts 0 0 0  PHQ-9 Score 18 19 11   Difficult doing work/chores - Very difficult Somewhat difficult  Some recent data might be hidden   GAD-7 Assessments:  GAD 7 : Generalized Anxiety Score 10/03/2019 08/09/2019 08/22/2018 07/25/2018  Nervous, Anxious, on Edge 1 0 1 1  Control/stop worrying 0 0 1 1  Worry too much - different things 0 0 0 1  Trouble relaxing 0 0 0 1  Restless 0 0 0 0  Easily annoyed or irritable 1 0 0 0  Afraid - awful might happen 0 0 1 0  Total GAD 7 Score 2 0 3 4  Anxiety Difficulty Not difficult at all Not difficult at all Not difficult at all Not difficult at all    Past Medical History Past Medical History:  Diagnosis Date   Acid reflux    Anemia    Arthritis    Axillary masses    Soft tissue - status post excision   Back pain    COVID-19 virus infection 04/06/2019   Depression    End-stage renal disease (HCC)    TTHSat  in [place] hypertension    History of blood transfusion    History of cardiac catheterization    Normal coronary arteries October 2020   History of claustrophobia    History of pneumonia 2019   Hypoxia 04/03/2019   Mixed hyperlipidemia    Obesity    Pancreatitis    Peritoneal dialysis catheter in place [facility])    Pneumonia due to COVID-19 virus 04/02/2019   Sleep apnea    Noncompliant with CPAP   Type 2 diabetes mellitus ([place])  Vital signs: There were no vitals filed for this visit.  Allergies:  Allergies as of 11/06/2020 - Review Complete 10/28/2020  Allergen Reaction Noted   Ace inhibitors Anaphylaxis and Swelling 06/07/2007   Penicillins Itching, Swelling, and Other (See Comments) 06/07/2007   Statins Other (See Comments) 12/24/2012   Albuterol Swelling 08/31/2017    Medication History Current medications:  Outpatient Encounter Medications as of 11/06/2020  Medication Sig   amLODipine (NORVASC) 5 MG tablet TAKE 1 TABLET BY MOUTH ONCE DAILY.   aspirin 81 MG chewable tablet CHEW 2 TABLETS BY MOUTH ONCE  DAILY   calcitRIOL (ROCALTROL) 0.25 MCG capsule Take 0.25 capsules by mouth.   cloNIDine (CATAPRES) 0.1 MG tablet Take 0.1 mg by mouth 2 (two) times daily.   Continuous Blood Gluc Sensor (FREESTYLE LIBRE 14 DAY SENSOR) MISC 1 each by Does not apply route every 14 (fourteen) days. Change every 2 weeks   cyclobenzaprine (FLEXERIL) 5 MG tablet TAKE ONE TABLET BY MOUTH AT BEDTIME AS NEEDED FOR MUSCLE SPASMS.   DULoxetine (CYMBALTA) 60 MG capsule TAKE (1) CAPSULE BY MOUTH ONCE DAILY.   ezetimibe (ZETIA) 10 MG tablet TAKE 1 TABLET BY MOUTH ONCE DAILY.   HUMALOG KWIKPEN 200 UNIT/ML KwikPen INJECT 24-28 UNITS S.Q. THREE TIMES A DAY BEFORE MEALS.   HYDROcodone-acetaminophen (NORCO/VICODIN) 5-325 MG tablet Take 1 tablet by mouth every 6 (six) hours as needed for moderate pain.   Insulin Glargine, 2 Unit Dial, (TOUJEO MAX SOLOSTAR) 300 UNIT/ML SOPN Inject 50-56 Units into the skin See admin instructions. Take after dialysis 3 day a week.   isosorbide mononitrate (IMDUR) 30 MG 24 hr tablet TAKE 1 TABLET BY MOUTH ONCE DAILY.   Melatonin 3 MG TBDP Take one tablet by mouth at bedtime , for sleep   metoprolol succinate (TOPROL-XL) 50 MG 24 hr tablet TAKE (1) TABLET BY MOUTH DAILY WITH FOOD   midodrine (PROAMATINE) 10 MG tablet TAKE 1 TABLET BY MOUTH TWICE DAILY   ondansetron (ZOFRAN) 4 MG tablet Take 1 tablet (4 mg total) by mouth every 8 (eight) hours as needed for nausea or vomiting.   pregabalin (LYRICA) 50 MG capsule TAKE (1) CAPSULE BY MOUTH THREE TIMES DAILY   sertraline (ZOLOFT) 100 MG tablet TAKE 1 TABLET BY MOUTH EVERY DAY   sertraline (ZOLOFT) 50 MG tablet TAKE 1 TABLET BY MOUTH ONCE DAILY.   torsemide (DEMADEX) 100 MG tablet TAKE 1 TABLET BY MOUTH TWICE DAILY   triamcinolone cream (KENALOG) 0.1 % APPLY TO AFFECTED AREA TWICE A DAY   ULTICARE MINI PEN NEEDLES 31G X 6 MM MISC USE AS DIRECTED   [name] 500 MG chewable tablet Chew 500 mg by mouth See admin instructions. Take 500 mg with each snack and  meal   zolpidem (AMBIEN) 5 MG tablet TAKE 1 TABLET BY MOUTH AT BEDTIME AS NEEDED.   [DISCONTINUED] FLUoxetine (PROZAC) 10 MG capsule Take 10 mg by mouth daily.     [DISCONTINUED] glipiZIDE (GLUCOTROL) 10 MG tablet Take 10 mg by mouth 2 (two) times daily before a meal.     Facility-Administered Encounter Medications as of 11/06/2020  Medication   0.9 %  sodium chloride infusion     Scribe for Treatment Team: Lubertha South, LCSW

## 2020-11-06 NOTE — Progress Notes (Signed)
[facility] Psychiatric Consultant Case Review   Kathryn Beck is a 65 y.o. year old female with a history of depression, CKD /on hemodialysis  secondary to hypertension, diabetes, MGUS, CHF, sleep apnea. Worsening depression, anxiety in the context of upcoming kidney transplant evaluation. She has a supportive husband, daughter and her sister. There is a concern of her doing no shows to her appointments due to her being overwhelmed. Per chart, she is on sertraline 100 mg daily, duloxetine 60 mg daily.   Assessment/Provisional Diagnosis # MDD She is on two antidepressants. Would advise to consolidate to one antidepressant to avoid polypharmacy. Also noted that it is generally recommended to avoid using duloxetine for patients who is on dialysis.   Recommendation  - Taper off duloxetine -  Uptitrate sertraline to 150 mg daily - BH specialist to offer weekly supportive therapy  Thank you for your consult. We will continue to follow the patient. Please contact [facility]  for any questions or concerns.   The above treatment considerations and suggestions are based on consultation with the [facility] specialist and/or PCP and a review of information available in the shared registry and the patient's [place] Record (EHR). I have not personally examined the patient. All recommendations should be implemented with consideration of the patient's relevant prior history and current clinical status. Please feel free to call me with any questions about the care of this patient.

## 2020-11-07 DIAGNOSIS — U071 COVID-19: Secondary | ICD-10-CM | POA: Diagnosis not present

## 2020-11-07 LAB — PROTEIN ELECTROPHORESIS, SERUM
A/G Ratio: 1.4 (ref 0.7–1.7)
Albumin ELP: 3.7 g/dL (ref 2.9–4.4)
Alpha-1-Globulin: 0.1 g/dL (ref 0.0–0.4)
Alpha-2-Globulin: 0.7 g/dL (ref 0.4–1.0)
Beta Globulin: 1.1 g/dL (ref 0.7–1.3)
Gamma Globulin: 0.7 g/dL (ref 0.4–1.8)
Globulin, Total: 2.6 g/dL (ref 2.2–3.9)
Total Protein ELP: 6.3 g/dL (ref 6.0–8.5)

## 2020-11-08 LAB — IMMUNOFIXATION ELECTROPHORESIS
IgA: 307 mg/dL (ref 87–352)
IgG (Immunoglobin G), Serum: 858 mg/dL (ref 586–1602)
IgM (Immunoglobulin M), Srm: 51 mg/dL (ref 26–217)
Total Protein ELP: 6.3 g/dL (ref 6.0–8.5)

## 2020-11-09 ENCOUNTER — Other Ambulatory Visit: Payer: Self-pay | Admitting: Orthopedic Surgery

## 2020-11-09 DIAGNOSIS — M545 Low back pain, unspecified: Secondary | ICD-10-CM

## 2020-11-11 ENCOUNTER — Other Ambulatory Visit: Payer: Self-pay | Admitting: Family Medicine

## 2020-11-13 ENCOUNTER — Other Ambulatory Visit: Payer: Self-pay | Admitting: Family Medicine

## 2020-11-14 ENCOUNTER — Inpatient Hospital Stay (HOSPITAL_BASED_OUTPATIENT_CLINIC_OR_DEPARTMENT_OTHER): Payer: Medicare Other | Admitting: Nurse Practitioner

## 2020-11-14 ENCOUNTER — Other Ambulatory Visit: Payer: Self-pay

## 2020-11-14 VITALS — BP 179/69 | HR 87 | Temp 96.8°F | Wt 200.0 lb

## 2020-11-14 DIAGNOSIS — D472 Monoclonal gammopathy: Secondary | ICD-10-CM

## 2020-11-14 NOTE — Patient Instructions (Addendum)
[place] at [facility] Discharge Instructions  You were seen today by Beckey Rutter, NP. She discussed your lab results and everything is looking good. She wants you to follow up with your kidney doctor to discuss the fatigue you are having.   Please follow up as scheduled.   Thank you for choosing [place] at [facility] to provide your oncology and hematology care.  To afford each patient quality time with our provider, please arrive at least 15 minutes before your scheduled appointment time.   If you have a lab appointment with the [facility] please come in thru the Main Entrance and check in at the main information desk.  You need to re-schedule your appointment should you arrive 10 or more minutes late.  We strive to give you quality time with our providers, and arriving late affects you and other patients whose appointments are after yours.  Also, if you no show three or more times for appointments you may be dismissed from the clinic at the providers discretion.     Again, thank you for choosing [facility].  Our hope is that these requests will decrease the amount of time that you wait before being seen by our physicians.       _____________________________________________________________  Should you have questions after your visit to [facility], please contact our office at [phone] and follow the prompts.  Our office hours are 8:00 a.m. and 4:30 p.m. Monday - Friday.  Please note that voicemails left after 4:00 p.m. may not be returned until the following business day.  We are closed weekends and major holidays.  You do have access to a nurse 24-7, just call the main number to the clinic [phone] and do not press any options, hold on the line and a nurse will answer the phone.    For prescription refill requests, have your pharmacy contact our office and allow 72 hours.    Due to Covid, you will  need to wear a mask upon entering the hospital. If you do not have a mask, a mask will be given to you at the Main Entrance upon arrival. For doctor visits, patients may have 1 support person age 56 or older with them. For treatment visits, patients can not have anyone with them due to social distancing guidelines and our immunocompromised population.

## 2020-11-14 NOTE — Progress Notes (Signed)
[place], [place] [address]   Virtual Visit Progress Note  I connected with Kathryn Beck on 11/14/20 at 10:40 AM EDT by video enabled telemedicine visit and verified that I am speaking with the correct person using two identifiers.   I discussed the limitations, risks, security and privacy concerns of performing an evaluation and management service by telemedicine and the availability of in-person appointments. I also discussed with the patient that there may be a patient responsible charge related to this service. The patient expressed understanding and agreed to proceed.   Other persons participating in the visit and their role in the encounter: RN, NP, Patient, patient's daughter  Patient's location: [facility]  Provider's location: [facility]   CLINIC:  Medical Oncology/Hematology  PCP:  Fayrene Helper, MD [address], [address] [place] [address] [phone]  REASON FOR VISIT: Follow-up for MGUS  CURRENT THERAPY: Observation   INTERVAL HISTORY:  Kathryn Beck 65 y.o. female returns for routine follow-up for MGUS. She has fatigue. She continues dialysis. No new bone pain, bleeding, melena or hematochezia. No nose bleedings, gum bleeding, hematuria. No chest pain or shortness of breath.  No numbness or tingling of the hands or feet.  Denies any recent fevers, chills, infections, recent hospitalizations.  She is eating well and her weight is stable.  She questions if restarting Retacrit would help improve her energy  REVIEW OF SYSTEMS:  Review of Systems  Constitutional:  Positive for fatigue. Negative for appetite change and unexpected weight change.  HENT:   Negative for mouth sores, sore throat and trouble swallowing.   Respiratory:  Negative for chest tightness and shortness of breath.   Cardiovascular:  Negative for leg swelling.  Gastrointestinal:  Negative for abdominal pain, constipation, diarrhea, nausea and vomiting.   Genitourinary:  Negative for bladder incontinence and dysuria.   Musculoskeletal:  Negative for flank pain and neck stiffness.  Skin:  Negative for itching, rash and wound.  Neurological:  Negative for dizziness, headaches, light-headedness and numbness.  Hematological:  Negative for adenopathy. Does not bruise/bleed easily.  Psychiatric/Behavioral:  Negative for confusion, depression and sleep disturbance. The patient is not nervous/anxious.     PAST MEDICAL/SURGICAL HISTORY:  Past Medical History:  Diagnosis Date   Acid reflux    Anemia    Arthritis    Axillary masses    Soft tissue - status post excision   Back pain    COVID-19 virus infection 04/06/2019   Depression    End-stage renal disease ([place])    TTHSat  in [place] hypertension    History of blood transfusion    History of cardiac catheterization    Normal coronary arteries October 2020   History of claustrophobia    History of pneumonia 2019   Hypoxia 04/03/2019   Mixed hyperlipidemia    Obesity    Pancreatitis    Peritoneal dialysis catheter in place [facility])    Pneumonia due to COVID-19 virus 04/02/2019   Sleep apnea    Noncompliant with CPAP   Type 2 diabetes mellitus ([place])    Past Surgical History:  Procedure Laterality Date   ABDOMINAL HYSTERECTOMY     AV FISTULA PLACEMENT Left 09/02/2017   Procedure: creation of left arm ARTERIOVENOUS (AV) FISTULA;  Surgeon: Serafina Mitchell, MD;  Location: [facility];  Service: Vascular;  Laterality: Left;   COLONOSCOPY  2008   Dr. Oneida Alar: normal    COLONOSCOPY N/A  12/18/2016   Dr. Oneida Alar: multiple tubular adenomas, internal hemorrhoids. Surveillance in 3 years    ESOPHAGEAL DILATION N/A 10/13/2015   Procedure: ESOPHAGEAL DILATION;  Surgeon: Rogene Houston, MD;  [facility];  Service: Endoscopy;  Laterality: N/A;   ESOPHAGOGASTRODUODENOSCOPY N/A 10/13/2015   Dr. Laural Golden: chronic gastritis on path, no H.pylori. Empiric dilation     ESOPHAGOGASTRODUODENOSCOPY N/A 12/18/2016   Dr. Oneida Alar: mild gastritis. BRAVO study revealed uncontrolled GERD. Dysphagia secondary to uncontrolled reflux   FOOT SURGERY Bilateral    "nerve"     LEFT HEART CATH AND CORONARY ANGIOGRAPHY N/A 12/29/2018   Procedure: LEFT HEART CATH AND CORONARY ANGIOGRAPHY;  Surgeon: Jettie Booze, MD;  Location: [facility];  Service: Cardiovascular;  Laterality: N/A;   LUNG BIOPSY     MASS EXCISION Right 01/09/2013   Procedure: EXCISION OF NEOPLASM OF RIGHT  AXILLA  AND EXCISION OF NEOPLASM OF LEFT AXILLA;  Surgeon: Jamesetta So, MD;  Location: [facility];  Service: General;  Laterality: Right;  procedure end @ 08:23   MYRINGOTOMY WITH TUBE PLACEMENT Bilateral 04/28/2017   Procedure: BILATERAL MYRINGOTOMY WITH TUBE PLACEMENT;  Surgeon: Leta Baptist, MD;  Location: [facility];  Service: ENT;  Laterality: Bilateral;   REVISION OF ARTERIOVENOUS GORETEX GRAFT Left 05/04/2018   Procedure: TRANSPOSITION OF CEPHALIC VEIN ARTERIOVENOUS FISTULA LEFT ARM;  Surgeon: Rosetta Posner, MD;  Location: [facility];  Service: Vascular;  Laterality: Left;   SAVORY DILATION N/A 12/18/2016   Procedure: SAVORY DILATION;  Surgeon: Danie Binder, MD;  [facility];  Service: Endoscopy;  Laterality: N/A;    SOCIAL HISTORY:  Social History   Socioeconomic History   Marital status: Married    Spouse name: larry    Number of children: 2   Years of education: 12   Highest education level: 12th grade  Occupational History   Occupation: retired   Tobacco Use   Smoking status: Never   Smokeless tobacco: Never  Scientific laboratory technician Use: Never used  Substance and Sexual Activity   Alcohol use: No   Drug use: No   Sexual activity: Not Currently  Other Topics Concern   Not on file  Social History Narrative   Lives alone with husband    Social Determinants of Health   Financial Resource Strain: Not on file  Food Insecurity: Not on file  Transportation Needs: Not on file   Physical Activity: Not on file  Stress: Not on file  Social Connections: Not on file  Intimate Partner Violence: Not on file    FAMILY HISTORY:  Family History  Problem Relation Age of Onset   Hypertension Father    Hypercholesterolemia Father    Arthritis Father    Hypertension Sister    Hypercholesterolemia Sister    Breast cancer Sister    Hypertension Sister    Colon cancer Neg Hx    Colon polyps Neg Hx     CURRENT MEDICATIONS:  Outpatient Encounter Medications as of 11/14/2020  Medication Sig   amLODipine (NORVASC) 5 MG tablet TAKE 1 TABLET BY MOUTH ONCE DAILY.   aspirin 81 MG chewable tablet CHEW 2 TABLETS BY MOUTH ONCE DAILY   calcitRIOL (ROCALTROL) 0.25 MCG capsule Take 0.25 capsules by mouth.   cloNIDine (CATAPRES) 0.1 MG tablet TAKE 1 TABLET BY MOUTH TWICE DAILY   Continuous Blood Gluc Sensor (FREESTYLE LIBRE 14 DAY SENSOR) MISC 1 each by Does not apply route every 14 (fourteen) days. Change every  2 weeks   cyclobenzaprine (FLEXERIL) 5 MG tablet TAKE ONE TABLET BY MOUTH AT BEDTIME AS NEEDED FOR MUSCLE SPASMS.   DULoxetine (CYMBALTA) 60 MG capsule TAKE (1) CAPSULE BY MOUTH ONCE DAILY.   ezetimibe (ZETIA) 10 MG tablet TAKE 1 TABLET BY MOUTH ONCE DAILY.   HUMALOG KWIKPEN 200 UNIT/ML KwikPen INJECT 24-28 UNITS S.Q. THREE TIMES A DAY BEFORE MEALS.   HYDROcodone-acetaminophen (NORCO/VICODIN) 5-325 MG tablet Take 1 tablet by mouth every 6 (six) hours as needed for moderate pain.   Insulin Glargine, 2 Unit Dial, (TOUJEO MAX SOLOSTAR) 300 UNIT/ML SOPN Inject 50-56 Units into the skin See admin instructions. Take after dialysis 3 day a week.   isosorbide mononitrate (IMDUR) 30 MG 24 hr tablet TAKE 1 TABLET BY MOUTH ONCE DAILY.   Melatonin 3 MG TBDP Take one tablet by mouth at bedtime , for sleep   metoprolol succinate (TOPROL-XL) 50 MG 24 hr tablet TAKE (1) TABLET BY MOUTH DAILY WITH FOOD   midodrine (PROAMATINE) 10 MG tablet TAKE 1 TABLET BY MOUTH TWICE DAILY   ondansetron  (ZOFRAN) 4 MG tablet Take 1 tablet (4 mg total) by mouth every 8 (eight) hours as needed for nausea or vomiting.   pregabalin (LYRICA) 50 MG capsule TAKE (1) CAPSULE BY MOUTH THREE TIMES DAILY   sertraline (ZOLOFT) 100 MG tablet TAKE 1 TABLET BY MOUTH EVERY DAY   sertraline (ZOLOFT) 50 MG tablet TAKE 1 TABLET BY MOUTH ONCE DAILY.   torsemide (DEMADEX) 100 MG tablet TAKE 1 TABLET BY MOUTH TWICE DAILY   triamcinolone cream (KENALOG) 0.1 % APPLY TO AFFECTED AREA TWICE A DAY   ULTICARE MINI PEN NEEDLES 31G X 6 MM MISC USE AS DIRECTED   [name] 500 MG chewable tablet Chew 500 mg by mouth See admin instructions. Take 500 mg with each snack and meal   zolpidem (AMBIEN) 5 MG tablet TAKE 1 TABLET BY MOUTH AT BEDTIME AS NEEDED.   [DISCONTINUED] FLUoxetine (PROZAC) 10 MG capsule Take 10 mg by mouth daily.     [DISCONTINUED] glipiZIDE (GLUCOTROL) 10 MG tablet Take 10 mg by mouth 2 (two) times daily before a meal.     Facility-Administered Encounter Medications as of 11/14/2020  Medication   0.9 %  sodium chloride infusion    ALLERGIES:  Allergies  Allergen Reactions   Ace Inhibitors Anaphylaxis and Swelling   Penicillins Itching, Swelling and Other (See Comments)    Did it involve swelling of the face/tongue/throat, SOB, or low BP? Unknown Did it involve sudden or severe rash/hives, skin peeling, or any reaction on the inside of your mouth or nose? Unknown Did you need to seek medical attention at a hospital or doctor's office? Unknown When did it last happen?      years  If all above answers are "NO", may proceed with cephalosporin use.    Statins Other (See Comments)    elevated LFT's     Albuterol Swelling     PHYSICAL EXAM:  ECOG Performance status: 1 Vitals:   11/14/20 1054  BP: (!) 179/69  Pulse: 87  Temp: (!) 96.8 F (36 C)  SpO2: 97%   Filed Weights   11/14/20 1054  Weight: 199 lb 15.3 oz (90.7 kg)    Physical Exam Constitutional:      General: She is not in acute  distress. HENT:     Head: Normocephalic.  Pulmonary:     Effort: No respiratory distress.  Neurological:     Mental Status: She is alert and  oriented to person, place, and time.  Psychiatric:        Mood and Affect: Mood normal.        Behavior: Behavior normal.     LABORATORY DATA:  I have reviewed the labs as listed.  CBC    Component Value Date/Time   WBC 6.8 11/05/2020 1015   RBC 4.13 11/05/2020 1015   HGB 10.7 (L) 11/05/2020 1015   HCT 34.9 (L) 11/05/2020 1015   PLT 243 11/05/2020 1015   MCV 84.5 11/05/2020 1015   MCH 25.9 (L) 11/05/2020 1015   MCHC 30.7 11/05/2020 1015   RDW 18.4 (H) 11/05/2020 1015   LYMPHSABS 1.5 11/05/2020 1015   MONOABS 0.5 11/05/2020 1015   EOSABS 0.1 11/05/2020 1015   BASOSABS 0.0 11/05/2020 1015   CMP Latest Ref Rng & Units 11/05/2020 07/27/2019 07/25/2019  Glucose 70 - 99 mg/dL 253(H) - -  BUN 8 - 23 mg/dL 54(H) - -  Creatinine 0.44 - 1.00 mg/dL 7.69(H) - -  Sodium 135 - 145 mmol/L 136 - -  Potassium 3.5 - 5.1 mmol/L 4.7 3.6 2.6(LL)  Chloride 98 - 111 mmol/L 100 - -  CO2 22 - 32 mmol/L 23 - -  Calcium 8.9 - 10.3 mg/dL 8.6(L) - -  Total Protein 6.5 - 8.1 g/dL 6.7 - -  Total Bilirubin 0.3 - 1.2 mg/dL 0.7 - -  Alkaline Phos 38 - 126 U/L 69 - -  AST 15 - 41 U/L 23 - -  ALT 0 - 44 U/L 45(H) - -    ASSESSMENT & PLAN:  No problem-specific Assessment & Plan notes found for this encounter.  1.  Monoclonal gammopathy of uncertain significance: -Patient with history of CKD, currently on dialysis, with work-up for monoclonal gammopathy on 08/09/2017 that showed elevated M spike of 0.1 g/dL of IgG kappa monoclonal protein.  Free kappa light chains were elevated at 171 and lambda light chains at 75.7.  Ratio was 2.27.  24-hour urine showed nephrotic range proteinuria, this was 18 g. -Skeletal survey on 11/05/2017 was negative for lytic lesions. -She has been on observation since that time. -Labs today reviewed: M spike is no longer observed.  Kappa  lambda light chain ratio remains elevated but overall stable and unchanged.  No crab criteria.  Clinically, asymptomatic.  We will plan to check bone survey annually.  Recommend continuing to monitor at this time.  Patient agreeable. -She will follow-up in 6 months with repeat labs and bone survey   2.  CKD: -Thought to be secondary to a long history of diabetes, hypertension, obesity related glomerulopathy. -She is being followed by a [facility] team at [facility]. - continue T, Th, S dialysis   3.  Microcytic anemia: -She did receive 2 units of PRBC during hospitalization in June.  She also received Feraheme at that time. -She received Procrit injections in 2019.  However her hemoglobin improved to the point where she no longer needed them. -Labs on 03/03/2019 showed hemoglobin 9.0 -Procrit injections were started back with her dialysis. - In setting of microcytosis iron studies were performed.  Ferritin elevated at 1360 which I suspect is reactive however, her iron saturation is also elevated at 53.  I recommend that she discuss with her kidney doctors   4.  Hypokalemia: -resolved  Disposition: 6 months - bone survey & labs and see MD    Orders placed this encounter:  No orders of the defined types were placed in this encounter.  I discussed the assessment and treatment plan with the patient. The patient was provided an opportunity to ask questions and all were answered. The patient agreed with the plan and demonstrated an understanding of the instructions.   The patient was advised to call back or seek an in-person evaluation if the symptoms worsen or if the condition fails to improve as anticipated.   I spent 20 minutes face-to-face video visit time dedicated to the care of this patient on the date of this encounter to include pre-visit review of heme/onc notes, labs, imaging, face-to-face time with the patient, and post visit ordering of testing/documentation.    Beckey Rutter, DNP, AGNP-C [place] [phone]

## 2020-11-15 ENCOUNTER — Encounter (HOSPITAL_COMMUNITY): Payer: Self-pay | Admitting: Hematology

## 2020-11-20 DIAGNOSIS — N186 End stage renal disease: Secondary | ICD-10-CM | POA: Diagnosis not present

## 2020-11-20 DIAGNOSIS — Z992 Dependence on renal dialysis: Secondary | ICD-10-CM | POA: Diagnosis not present

## 2020-11-21 ENCOUNTER — Ambulatory Visit (INDEPENDENT_AMBULATORY_CARE_PROVIDER_SITE_OTHER): Payer: 59 | Admitting: Internal Medicine

## 2020-11-21 ENCOUNTER — Other Ambulatory Visit: Payer: Self-pay

## 2020-11-21 ENCOUNTER — Encounter: Payer: Self-pay | Admitting: Internal Medicine

## 2020-11-21 VITALS — BP 128/82 | HR 76 | Ht 64.0 in | Wt 206.0 lb

## 2020-11-21 DIAGNOSIS — D3500 Benign neoplasm of unspecified adrenal gland: Secondary | ICD-10-CM

## 2020-11-21 DIAGNOSIS — E782 Mixed hyperlipidemia: Secondary | ICD-10-CM | POA: Diagnosis not present

## 2020-11-21 DIAGNOSIS — E1165 Type 2 diabetes mellitus with hyperglycemia: Secondary | ICD-10-CM | POA: Diagnosis not present

## 2020-11-21 DIAGNOSIS — E1122 Type 2 diabetes mellitus with diabetic chronic kidney disease: Secondary | ICD-10-CM

## 2020-11-21 DIAGNOSIS — N186 End stage renal disease: Secondary | ICD-10-CM | POA: Diagnosis not present

## 2020-11-21 DIAGNOSIS — IMO0002 Reserved for concepts with insufficient information to code with codable children: Secondary | ICD-10-CM

## 2020-11-21 LAB — LIPID PANEL
Cholesterol: 190 mg/dL (ref 0–200)
HDL: 43.1 mg/dL
NonHDL: 146.6
Total CHOL/HDL Ratio: 4
Triglycerides: 272 mg/dL — ABNORMAL HIGH (ref 0.0–149.0)
VLDL: 54.4 mg/dL — ABNORMAL HIGH (ref 0.0–40.0)

## 2020-11-21 LAB — POTASSIUM: Potassium: 4.1 meq/L (ref 3.5–5.1)

## 2020-11-21 LAB — LDL CHOLESTEROL, DIRECT: Direct LDL: 120 mg/dL

## 2020-11-21 MED ORDER — FREESTYLE LIBRE 2 SENSOR MISC: 1.0000 | 3 refills | Status: DC

## 2020-11-21 NOTE — Patient Instructions (Addendum)
Please decrease: - Toujeo 54 units in the evening  Please change: - Humalog : 24 units before a smaller meal 26-28 units before a regular meal 30 units before a larger meal Take Humalog EVERY TIME you eat, 15 min before the meal, if sugars are >70. - Humalog sliding scale: - 150-175: + 1 unit  - 176-200: + 2 units  - 201-225: + 3 units  - 226-250: + 4 units  - 251-275: + 5 units - 276-300: + 6 units  Try not to correct sugars at bedtime unless they are >300 and in that case, take no more than 4 units.  Check with your insurance if the following pumps are covered: - [employer] - Omnipod - t:slim x2 Let me know what they say.  Please return in 3 months with your sugar log.

## 2020-11-21 NOTE — Progress Notes (Addendum)
Patient ID: Kathryn Beck, female   DOB: 1955-03-26, 65 y.o.   MRN: [id number]  This visit occurred during the SARS-CoV-2 public health emergency.  Safety protocols were in place, including screening questions prior to the visit, additional usage of staff PPE, and extensive cleaning of exam room while observing appropriate contact time as indicated for disinfecting solutions.   HPI: Kathryn Beck is a 65 y.o.-year-old female, initially referred by her PCP, Dr. Moshe Cipro, returning for follow-up for of DM2, dx in 2008, insulin-dependent since ~2015, uncontrolled, with complications (ESRD, PN). She saw Dr. Dorris Fetch - last OV with him 05/2016.  Last visit with me 1 year and 6 months ago.  She is here with her daughter who offers part of the history especially related to medication doses, blood sugars, and other medical history.  Interim history: She was previously on peritoneal dialysis but changed to hemodialysis before our last visit.  She is now awaiting a kidney transplant. She was recently seen by my colleague, Dr. Dwyane Dee on 10/28/2020, after she called with very high blood sugars, more than 500s.  At that time, this was noticed that she was drinking sweet tea which Dr. Dwyane Dee advised her to stop.  At that time, he advised her to decrease the dose of Toujeo and vary the dose of Humalog.   She is otherwise feeling well, with no increased urination, blurry vision, nausea, chest pain. She has dizziness. Since last visit, she obtained CGM.  Adrenal masses.  Reviewed previous imaging reports: 04/22/2018: MRI abd.: Stable 19 mm nodule in the left adrenal gland, unchanged since 2015, 19 mm otherwise negative.  05/28/2017: MRI abd: Bilateral adrenal nodules with signal dropout on out of phase imaging, consistent with adenomas.   Example at 2.5 cm on the left and on the order of 1.6 cm on the right.  10/10/2015: CT abdomen and pelvis without contrast: Stable 2.2 cm left adrenal nodule with Hounsfield unit  measurements 28 unchanged likely a lipid poor adenoma  08/23/2008: MRI of the abdomen with and without contrast: Left adrenal mass measuring 1.6 x 1.5 cm (previously measuring 1.4 x 1.6 cm on the CT scan from 2003 -stable in size),  Of note, she has an enlarging pancreatic head mass - pt reports a h/o pancreatitis. (This will need to be eval. By GI)  Reviewed previous adrenal biochemical investigation:  Dexamethasone suppression test showed a low normal, nonsuppressed cortisol, but I suspect that this is due to her end-stage renal disease and a deficiency in cortisol clearance, rather than decreased production. Component     Latest Ref Rng & Units 08/31/2018  Cortisol - AM     mcg/dL 6.1  Dexamethasone, Serum     ng/dL 446   A 24-hour urine cortisol could not be done as she is on dialysis  Previous investigation was negative for hyperaldosteronism or pheochromocytoma: Component     Latest Ref Rng & Units 10/04/2018          Epinephrine     pg/mL 24  Norepinephrine     pg/mL 167 (L)  Dopamine     pg/mL <10  Total Catecholamines     pg/mL 191 (L)  Metanephrine, Pl     <=57 pg/mL <25  Normetanephrine, Pl     <=148 pg/mL 38  Total Metanephrines-Plasma     <=205 pg/mL 38  ALDOSTERONE      ng/dL 2  Renin Activity     0.25 - 5.82 ng/mL/h 1.48  ALDO / PRA Ratio  0.9 - 28.9 Ratio 1.4  Potassium     3.5 - 5.1 mEq/L 4.9   Previously: Component     Latest Ref Rng & Units 11/30/2017  Epinephrine     pg/mL  undetectable  Norepinephrine     pg/mL 99  Dopamine     pg/mL  undetectable  Catecholamines, Total     pg/mL 99  Metanephrine, Pl     <=57 pg/mL <25  Normetanephrine, Pl     <=148 pg/mL 71  Total Metanephrines-Plasma     <=205 pg/mL 71  ALDOSTERONE      ng/dL 3  Renin Activity     0.25 - 5.82 ng/mL/h 1.20  ALDO / PRA Ratio     0.9 - 28.9 Ratio 2.5  Potassium     3.5 - 5.1 mEq/L 4.0   DM2: Reviewed HbA1c levels: 11/01/2020: HbA1c 9.3% Lab Results  Component  Value Date   HGBA1C 9.1 (A) 10/28/2020   HGBA1C 8.4 (A) 06/18/2020   HGBA1C 7.2 (A) 02/28/2020  02/09/2020: HbA1c 6.8%  Pt was on a regimen of: - Humalog 75/25 20-26 units before each meal, 2-3X a day depending on the CBG before the meal  - Levemir 100 units at bedtime  Then on: - Tresiba 40 units daily after dialysis - Humalog 20 units before a smaller meal - Humalog 32 units before a larger meal Please do not take Humalog if sugars before a meal are <60. Otherwise, take it whenever you eat.   At last visit, she was not taking her medications correctly: - Tresiba 50 units daily -she takes it before dialysis despite advice to take it after dialysis - Toujeo 50-56 units after dialysis  - apparently this was added by another doctor (?  They cannot remember name) to be taken 3 times a week after dialysis - Humalog  24 units before a smaller meal 28 units before a larger meal Take Humalog EVERY TIME you eat, 15 min before the meal, if sugars are >60. - Humalog sliding scale: - 150-175: + 1 unit  - 176-200: + 2 units  - 201-225: + 3 units  - 226-250: + 4 units  - 251-275: + 5 units - 276-300: + 6 units  Currently on: - Toujeo 60 units at night (did not decrease the dose as advised by Dr. Dwyane Dee) - Humalog 32-34 units  before meals (did not change the dose as advised by Dr. Dwyane Dee)  Pt checks her sugars 4 times a day - now with a freestyle libre CGM:   Previously: - am: 68, 103-406, 530 >> 130, 144, 198-314 >> 57, 152-223, 241 - 2h after b'fast:  31, 95-373 >> n/c >> 143, 164, 384 - before lunch: 68, 125-323 >> 151-344, 450 >> 98-201, 334, 361 - 2h after lunch: n/c >> 178 >> 350 >> 100-224 - before dinner:  64-288, 340 >> n/c >> 218-367 >> 69, 106-194 - 2h after dinner: n/c >> 190-226 - bedtime: 167-242, 362 >> 90-207 >> n/c >> 209, 226 >> 124-201, 232 - nighttime: n/c  Lowest sugar was 31 >> 130 >> 57 >> 51 at waking up; it is unclear at which level she has hypoglycemia  awareness. Highest sugar was HI >> 530 >> 499 >> 241 >> 500, then 300s  Glucometer: Accuchek  Pt's meals are: - Breakfast: eggs, bacon, oatmeal, grits - Lunch: salads - Dinner: meat + veggie or sandwich + salad or nabs - Snacks: nabs, fruit cups, jello  -+ ESRD, on dialysis:  Lab Results  Component Value Date   BUN 54 (H) 11/05/2020   BUN 35 (H) 07/19/2019   CREATININE 7.69 (H) 11/05/2020   CREATININE 5.96 (H) 07/19/2019  Off olmesartan.  On irbesartan now.  -+ HL; last set of lipids: Lab Results  Component Value Date   CHOL 199 03/08/2018   HDL 37 (L) 03/08/2018   LDLCALC 114 (H) 03/08/2018   LDLDIRECT 107 05/19/2016   TRIG 242 (H) 03/08/2018   CHOLHDL 5.4 03/08/2018  Previously on Lipitor 40, now on Zetia.  - last eye exam was in 07/2020: no DR  -No numbness but she has tingling in her feet.  She is on Neurontin 300 mg 3 times a day  She also has HTN.  ROS: + See HPI  I reviewed pt's medications, allergies, PMH, social hx, family hx, and changes were documented in the history of present illness. Otherwise, unchanged from my initial visit note.  Past Medical History:  Diagnosis Date   Acid reflux    Anemia    Arthritis    Axillary masses    Soft tissue - status post excision   Back pain    COVID-19 virus infection 04/06/2019   Depression    End-stage renal disease (HCC)    [employer]  in [employer] hypertension    History of blood transfusion    History of cardiac catheterization    Normal coronary arteries October 2020   History of claustrophobia    History of pneumonia 2019   Hypoxia 04/03/2019   Mixed hyperlipidemia    Obesity    Pancreatitis    Peritoneal dialysis catheter in place [facility])    Pneumonia due to COVID-19 virus 04/02/2019   Sleep apnea    Noncompliant with CPAP   Type 2 diabetes mellitus ([place])    Past Surgical History:  Procedure Laterality Date   ABDOMINAL HYSTERECTOMY     AV FISTULA PLACEMENT Left 09/02/2017   Procedure:  creation of left arm ARTERIOVENOUS (AV) FISTULA;  Surgeon: Serafina Mitchell, MD;  Location: [place];  Service: Vascular;  Laterality: Left;   COLONOSCOPY  2008   Dr. Oneida Alar: normal    COLONOSCOPY N/A 12/18/2016   Dr. Oneida Alar: multiple tubular adenomas, internal hemorrhoids. Surveillance in 3 years    ESOPHAGEAL DILATION N/A 10/13/2015   Procedure: ESOPHAGEAL DILATION;  Surgeon: Rogene Houston, MD;  [facility];  Service: Endoscopy;  Laterality: N/A;   ESOPHAGOGASTRODUODENOSCOPY N/A 10/13/2015   Dr. Laural Golden: chronic gastritis on path, no H.pylori. Empiric dilation    ESOPHAGOGASTRODUODENOSCOPY N/A 12/18/2016   Dr. Oneida Alar: mild gastritis. BRAVO study revealed uncontrolled GERD. Dysphagia secondary to uncontrolled reflux   FOOT SURGERY Bilateral    "nerve"     LEFT HEART CATH AND CORONARY ANGIOGRAPHY N/A 12/29/2018   Procedure: LEFT HEART CATH AND CORONARY ANGIOGRAPHY;  Surgeon: Jettie Booze, MD;  Location: [place] [facility];  Service: Cardiovascular;  Laterality: N/A;   LUNG BIOPSY     MASS EXCISION Right 01/09/2013   Procedure: EXCISION OF NEOPLASM OF RIGHT  AXILLA  AND EXCISION OF NEOPLASM OF LEFT AXILLA;  Surgeon: Jamesetta So, MD;  Location: [facility];  Service: General;  Laterality: Right;  procedure end @ 08:23   MYRINGOTOMY WITH TUBE PLACEMENT Bilateral 04/28/2017   Procedure: BILATERAL MYRINGOTOMY WITH TUBE PLACEMENT;  Surgeon: Leta Baptist, MD;  Location: [place];  Service: ENT;  Laterality: Bilateral;   REVISION OF ARTERIOVENOUS GORETEX GRAFT Left 05/04/2018   Procedure: TRANSPOSITION  OF CEPHALIC VEIN ARTERIOVENOUS FISTULA LEFT ARM;  Surgeon: Rosetta Posner, MD;  Location[facility] OR;  Service: Vascular;  Laterality: Left;   SAVORY DILATION N/A 12/18/2016   Procedure: SAVORY DILATION;  Surgeon: Danie Binder, MD;  [facility];  Service: Endoscopy;  Laterality: N/A;   Social History   Socioeconomic History   Marital status: Married    Spouse name: Not on file   Number  of children: 2  Occupational History   CNA  Tobacco Use   Smoking status: Never Smoker   Smokeless tobacco: Never Used  Substance and Sexual Activity   Alcohol use: No   Drug use: No   Current Outpatient Medications on File Prior to Visit  Medication Sig Dispense Refill   amLODipine (NORVASC) 5 MG tablet TAKE 1 TABLET BY MOUTH ONCE DAILY. 28 tablet 11   aspirin 81 MG chewable tablet CHEW 2 TABLETS BY MOUTH ONCE DAILY 56 tablet 11   B Complex-C-Folic Acid (RENA-VITE RX) 1 MG TABS Take 1 tablet by mouth daily.     calcitRIOL (ROCALTROL) 0.25 MCG capsule Take 0.25 capsules by mouth.     cloNIDine (CATAPRES) 0.2 MG tablet Take 0.2 mg by mouth 2 (two) times daily.     Continuous Blood Gluc Sensor (FREESTYLE LIBRE 14 DAY SENSOR) MISC 1 each by Does not apply route every 14 (fourteen) days. Change every 2 weeks 2 each 0   cyclobenzaprine (FLEXERIL) 5 MG tablet TAKE ONE TABLET BY MOUTH AT BEDTIME AS NEEDED FOR MUSCLE SPASMS. 30 tablet 0   DULoxetine (CYMBALTA) 60 MG capsule TAKE (1) CAPSULE BY MOUTH ONCE DAILY. 28 capsule 11   ezetimibe (ZETIA) 10 MG tablet TAKE 1 TABLET BY MOUTH ONCE DAILY. 28 tablet 11   HUMALOG KWIKPEN 200 UNIT/ML KwikPen INJECT 24-28 UNITS S.Q. THREE TIMES A DAY BEFORE MEALS. 12 mL 11   HYDROcodone-acetaminophen (NORCO/VICODIN) 5-325 MG tablet Take 1 tablet by mouth every 6 (six) hours as needed for moderate pain. 30 tablet 0   Insulin Glargine, 2 Unit Dial, (TOUJEO MAX SOLOSTAR) 300 UNIT/ML SOPN Inject 50-56 Units into the skin See admin instructions. Take after dialysis 3 day a week. 3 pen 11   irbesartan (AVAPRO) 150 MG tablet SMARTSIG:1 Tablet(s) By Mouth Every Evening     isosorbide mononitrate (IMDUR) 30 MG 24 hr tablet TAKE 1 TABLET BY MOUTH ONCE DAILY. 28 tablet 11   Melatonin 3 MG TBDP Take one tablet by mouth at bedtime , for sleep 30 tablet 2   metoprolol succinate (TOPROL-XL) 50 MG 24 hr tablet TAKE (1) TABLET BY MOUTH DAILY WITH FOOD 28 tablet 11   midodrine  (PROAMATINE) 10 MG tablet TAKE 1 TABLET BY MOUTH TWICE DAILY 56 tablet 5   ondansetron (ZOFRAN) 4 MG tablet Take 1 tablet (4 mg total) by mouth every 8 (eight) hours as needed for nausea or vomiting. 20 tablet 0   pregabalin (LYRICA) 50 MG capsule TAKE (1) CAPSULE BY MOUTH THREE TIMES DAILY 84 capsule 3   sertraline (ZOLOFT) 100 MG tablet TAKE 1 TABLET BY MOUTH EVERY DAY 90 tablet 2   sertraline (ZOLOFT) 50 MG tablet TAKE 1 TABLET BY MOUTH ONCE DAILY. 28 tablet PRN   torsemide (DEMADEX) 100 MG tablet TAKE 1 TABLET BY MOUTH TWICE DAILY 56 tablet 11   triamcinolone cream (KENALOG) 0.1 % APPLY TO AFFECTED AREA TWICE A DAY 30 g 0   [employer] MINI PEN NEEDLES 31G X 6 MM MISC USE AS DIRECTED 100 each 0  [name] 500 MG chewable tablet Chew 500 mg by mouth See admin instructions. Take 500 mg with each snack and meal     zolpidem (AMBIEN) 5 MG tablet TAKE 1 TABLET BY MOUTH AT BEDTIME AS NEEDED. 30 tablet 0   [DISCONTINUED] FLUoxetine (PROZAC) 10 MG capsule Take 10 mg by mouth daily.       [DISCONTINUED] glipiZIDE (GLUCOTROL) 10 MG tablet Take 10 mg by mouth 2 (two) times daily before a meal.       Current Facility-Administered Medications on File Prior to Visit  Medication Dose Route Frequency Provider Last Rate Last Admin   0.9 %  sodium chloride infusion   Intravenous Continuous Lockamy, Randi L, NP-C 20 mL/hr at 07/19/19 1641 Rate Verify at 07/19/19 1641   Allergies  Allergen Reactions   Ace Inhibitors Anaphylaxis and Swelling   Penicillins Itching, Swelling and Other (See Comments)    Did it involve swelling of the face/tongue/throat, SOB, or low BP? Unknown Did it involve sudden or severe rash/hives, skin peeling, or any reaction on the inside of your mouth or nose? Unknown Did you need to seek medical attention at a hospital or doctor's office? Unknown When did it last happen?      years  If all above answers are "NO", may proceed with cephalosporin use.    Statins Other (See Comments)     elevated LFT's     Albuterol Swelling   Family History  Problem Relation Age of Onset   Hypertension Father    Hypercholesterolemia Father    Arthritis Father    Hypertension Sister    Hypercholesterolemia Sister    Breast cancer Sister    Hypertension Sister    Colon cancer Neg Hx    Colon polyps Neg Hx     PE: BP 128/82 (BP Location: Right Arm, Patient Position: Sitting, Cuff Size: Normal)   Pulse 76   Ht 5\' 4"  (1.626 m)   Wt 206 lb (93.4 kg)   SpO2 96%   BMI 35.36 kg/m  Wt Readings from Last 3 Encounters:  11/21/20 206 lb (93.4 kg)  11/14/20 199 lb 15.3 oz (90.7 kg)  10/28/20 201 lb (91.2 kg)   Constitutional: overweight, in NAD Eyes: PERRLA, EOMI, no exophthalmos ENT: moist mucous membranes, no thyromegaly, no cervical lymphadenopathy Cardiovascular: RRR, No RG, +1/6 SEM, + bilateral leg swelling, pitting Respiratory: CTA B Gastrointestinal: abdomen soft, NT, ND, BS+ Musculoskeletal: no deformities, strength intact in all 4 Skin: moist, warm, no rashes Neurological: no tremor with outstretched hands, DTR normal in all 4  ASSESSMENT: 1. DM2, insulin-dependent, uncontrolled, with complications - CKD stage 4 - PN  2. HL  3. B adrenal adenomas  PLAN:  1. Patient with longstanding, uncontrolled, type 2 diabetes, previously on a premixed insulin regimen, and also long-acting insulin, then on basal-bolus insulin regimen, returning after long absence of 1.5 years.  At last visit, she was taken to long-acting insulins and I advised her to only stay with 1.  We also discussed about varying the dose of Humalog based on the size of the meal and to always inject it 15 minutes before the meal. -She called with high blood sugars in the 500s approximately 3 weeks ago when she was seen by my colleague while I was out of office.  At that time, she was only taking 15 units of Humalog if the sugars were at goal and up to 32 units if they were high.  She was advised to increase the  dose to 24-32 units.  She was also advised to decrease the dose of Toujeo from 60 to 54 units.  At this visit she tells me that she is still on the previous doses of Toujeo and Humalog, as she forgot to make the changes suggested by Dr. Dwyane Dee... -Most recent HbA1c reviewed from 11/01/2020 was 9.3%. CGM interpretation: -At today's visit, we reviewed her CGM downloads: It appears that 46% of values are in target range (goal >70%), while 48% are higher than 180 (goal <25%), and 6% are lower than 70 (goal <4%).  The calculated average blood sugar is 185.  The projected HbA1c for the next 3 months ([facility]) is 7.7%. -Reviewing the CGM trends, the sugars are very fluctuating, slightly better overnight, but then increasing abruptly after breakfast and after lunch and less after dinner.  Sugars drop at bedtime and increase again around 4 AM.  Upon questioning, patient did not make the recommended changes by Dr. Dwyane Dee (see above) so she is still taking a higher dose of Toujeo and also high dose of Humalog but per patient's daughter, she does not take the Humalog if her sugars are at goal before a meal or if she eats a smaller meal.  We discussed that she needs to take the mealtime insulin dose even if her sugars are at goal, for now I only suggested to hold it if the sugars are lower than 70s.  We also discussed about the fact that if she forgets to take the insulin 15 minutes before a meal, to take half of the insulin if she takes the insulin right after she eats but to only take correction insulin if she has to take it more than an hour after starting eating.  I also advised him how to vary the dose of Humalog based on the size of the meal.  I advised him also to not correct blood sugars at bedtime unless they are more than 300s, and in that case, plan to take no more than 4 units. -At this visit, patient is interested in a sensor with alarms so I sent the prescription for the freestyle libre 2 to her pharmacy -They are  also interested in an insulin pump.  I am not sure whether it is completely capable to manage an insulin pump.  I did explain that this is a more complicated process than managing the pump basal-bolus insulin regimen.  I still advised him to call insurance and see which pumps are covered and we will then have her come and see the diabetes educator and decide whether she is willing or able to maneuver the pump.  I advised her about the available pumps on the market and there.  CGM systems. - I suggested to:  Patient Instructions  Please decrease: - Toujeo 54 units in the evening  Please change: - Humalog : 24 units before a smaller meal 26-28 units before a regular meal 30 units before a larger meal Take Humalog EVERY TIME you eat, 15 min before the meal, if sugars are >70. - Humalog sliding scale: - 150-175: + 1 unit  - 176-200: + 2 units  - 201-225: + 3 units  - 226-250: + 4 units  - 251-275: + 5 units - 276-300: + 6 units  Try not to correct sugars at bedtime unless they are >300 and in that case, take no more than 4 units.  Check with your insurance if the following pumps are covered: - [employer]tr[employer] - Omnipod - t:slim x2  Let me know what they say.  Please return in 3 months with your sugar log.     - advised to check sugars at different times of the day - 4x a day, rotating check times - advised for yearly eye exams >> she is UTD - return to clinic in 3 months  2. HL -Reviewed latest lipid panel from 02/2018: LDL above target, triglycerides high, HDL low: Lab Results  Component Value Date   CHOL 199 03/08/2018   HDL 37 (L) 03/08/2018   LDLCALC 114 (H) 03/08/2018   LDLDIRECT 107 05/19/2016   TRIG 242 (H) 03/08/2018   CHOLHDL 5.4 03/08/2018  -Continues Zetia 10 mg daily without side effects.  She previously had transaminitis from statins. -We will check a lipid panel today  3.  Bilateral adrenal adenoma -Patient with history of bilateral adrenal adenomas per review of her  abdominal MRI from 2019.  Imaging characteristics pointing towards benign tumors, of 2.5 x 1.6 cm respectively.  However, they have appeared to increase over time.  Reviewing the MRI of the abdomen from 2010 and the CT of the abdomen and pelvis from 2017, only 1 nodule was seen in both, in the left adrenal.  She had another MRI on 04/22/2018 which showed a stable left 1.9 cm adrenal nodule -We will check plasma catecholamines, metanephrines, and aldosterone and they were all normal in 11/2017, while her norepinephrine was slightly elevated (nonspecific) 09/2018. -Of note, her dexamethasone suppression test was abnormal, but the cortisol was slightly high due to her end-stage renal disease and not actually increased cortisol production.  We cannot check a 24-hour urine cortisol as she is on dialysis.   -At today's visit, we will check plasma catecholamines and metanephrines along with potassium, aldosterone, and renin activity.  I will also add a salivary cortisol.  - Total time spent for the visit: 40 minutes, in obtaining medical information from the patient and her daughter and also from the chart, reviewing her  previous labs, imaging evaluations, and treatments, reviewing her symptoms, counseling her about her conditions (please see the discussed topics above), and developing a plan to further investigate and treat them; she and her daughter had a number of questions which I addressed.  Component     Latest Ref Rng & Units 11/21/2020          Cholesterol     0 - 200 mg/dL 190  Triglycerides     0.0 - 149.0 mg/dL 272.0 (H)  HDL Cholesterol     >39.00 mg/dL 43.10  VLDL     0.0 - 40.0 mg/dL 54.4 (H)  Total CHOL/HDL Ratio      4  NonHDL      146.60  Epinephrine     pg/mL <20  Norepinephrine     pg/mL 332  Dopamine     pg/mL <10  Total Catecholamines     pg/mL 332  Metanephrine, Pl     <=57 pg/mL <25  Normetanephrine, Pl     <=148 pg/mL 56  Total Metanephrines-Plasma     <=205 pg/mL  56  Potassium     3.5 - 5.1 mEq/L 4.1  Direct LDL     mg/dL 120.0  Renin Activity     0.25 - 5.82 ng/mL/h 0.28   LDL is elevated, slightly increased.  Unfortunately, she cannot take statins.  We will discuss about improving diet.   The rest of the adrenal labs are normal.  Aldosterone is still pending, though.  Component  Latest Ref Rng & Units 11/21/2020 12/02/2020  ALDOSTERONE      ng/dL <1   Renin Activity     0.25 - 5.82 ng/mL/h 0.28   Aldosterone level is not elevated.  [name], MD PhD [facility] Endocrinology

## 2020-11-28 ENCOUNTER — Ambulatory Visit: Payer: 59 | Admitting: Family Medicine

## 2020-12-02 ENCOUNTER — Encounter (HOSPITAL_COMMUNITY): Payer: Self-pay | Admitting: Emergency Medicine

## 2020-12-02 ENCOUNTER — Emergency Department (HOSPITAL_COMMUNITY)
Admission: EM | Admit: 2020-12-02 | Discharge: 2020-12-02 | Disposition: A | Discharge status: Home / Self Care | Payer: Medicare Other | Attending: Emergency Medicine | Admitting: Emergency Medicine

## 2020-12-02 ENCOUNTER — Telehealth: Payer: Self-pay

## 2020-12-02 ENCOUNTER — Emergency Department (HOSPITAL_COMMUNITY): Payer: Medicare Other

## 2020-12-02 ENCOUNTER — Encounter (HOSPITAL_COMMUNITY): Payer: Self-pay | Admitting: Hematology

## 2020-12-02 ENCOUNTER — Encounter: Payer: Self-pay | Admitting: Nurse Practitioner

## 2020-12-02 ENCOUNTER — Other Ambulatory Visit: Payer: Self-pay

## 2020-12-02 ENCOUNTER — Ambulatory Visit (INDEPENDENT_AMBULATORY_CARE_PROVIDER_SITE_OTHER): Payer: 59 | Admitting: Nurse Practitioner

## 2020-12-02 DIAGNOSIS — Z7982 Long term (current) use of aspirin: Secondary | ICD-10-CM | POA: Insufficient documentation

## 2020-12-02 DIAGNOSIS — I132 Hypertensive heart and chronic kidney disease with heart failure and with stage 5 chronic kidney disease, or end stage renal disease: Secondary | ICD-10-CM | POA: Insufficient documentation

## 2020-12-02 DIAGNOSIS — J029 Acute pharyngitis, unspecified: Secondary | ICD-10-CM | POA: Diagnosis present

## 2020-12-02 DIAGNOSIS — J069 Acute upper respiratory infection, unspecified: Secondary | ICD-10-CM | POA: Insufficient documentation

## 2020-12-02 DIAGNOSIS — J189 Pneumonia, unspecified organism: Secondary | ICD-10-CM | POA: Diagnosis not present

## 2020-12-02 DIAGNOSIS — N186 End stage renal disease: Secondary | ICD-10-CM | POA: Diagnosis not present

## 2020-12-02 DIAGNOSIS — R0602 Shortness of breath: Secondary | ICD-10-CM | POA: Diagnosis not present

## 2020-12-02 DIAGNOSIS — Z992 Dependence on renal dialysis: Secondary | ICD-10-CM | POA: Diagnosis not present

## 2020-12-02 DIAGNOSIS — I5032 Chronic diastolic (congestive) heart failure: Secondary | ICD-10-CM | POA: Diagnosis not present

## 2020-12-02 DIAGNOSIS — Z794 Long term (current) use of insulin: Secondary | ICD-10-CM | POA: Diagnosis not present

## 2020-12-02 DIAGNOSIS — J441 Chronic obstructive pulmonary disease with (acute) exacerbation: Secondary | ICD-10-CM | POA: Diagnosis not present

## 2020-12-02 DIAGNOSIS — Z20822 Contact with and (suspected) exposure to covid-19: Secondary | ICD-10-CM | POA: Insufficient documentation

## 2020-12-02 DIAGNOSIS — Z8616 Personal history of COVID-19: Secondary | ICD-10-CM | POA: Diagnosis not present

## 2020-12-02 DIAGNOSIS — Z79899 Other long term (current) drug therapy: Secondary | ICD-10-CM | POA: Insufficient documentation

## 2020-12-02 DIAGNOSIS — E1022 Type 1 diabetes mellitus with diabetic chronic kidney disease: Secondary | ICD-10-CM | POA: Diagnosis not present

## 2020-12-02 DIAGNOSIS — Z7984 Long term (current) use of oral hypoglycemic drugs: Secondary | ICD-10-CM | POA: Diagnosis not present

## 2020-12-02 DIAGNOSIS — J181 Lobar pneumonia, unspecified organism: Secondary | ICD-10-CM | POA: Insufficient documentation

## 2020-12-02 LAB — CBC WITH DIFFERENTIAL/PLATELET
Abs Immature Granulocytes: 0.07 10*3/uL (ref 0.00–0.07)
Basophils Absolute: 0 10*3/uL (ref 0.0–0.1)
Basophils Relative: 0 %
Eosinophils Absolute: 0.1 10*3/uL (ref 0.0–0.5)
Eosinophils Relative: 0 %
HCT: 35.3 % — ABNORMAL LOW (ref 36.0–46.0)
Hemoglobin: 10.6 g/dL — ABNORMAL LOW (ref 12.0–15.0)
Immature Granulocytes: 1 %
Lymphocytes Relative: 11 %
Lymphs Abs: 1.3 10*3/uL (ref 0.7–4.0)
MCH: 25.5 pg — ABNORMAL LOW (ref 26.0–34.0)
MCHC: 30 g/dL (ref 30.0–36.0)
MCV: 85.1 fL (ref 80.0–100.0)
Monocytes Absolute: 1.2 10*3/uL — ABNORMAL HIGH (ref 0.1–1.0)
Monocytes Relative: 10 %
Neutro Abs: 9.5 10*3/uL — ABNORMAL HIGH (ref 1.7–7.7)
Neutrophils Relative %: 78 %
Platelets: 257 10*3/uL (ref 150–400)
RBC: 4.15 MIL/uL (ref 3.87–5.11)
RDW: 17.8 % — ABNORMAL HIGH (ref 11.5–15.5)
WBC: 12.1 10*3/uL — ABNORMAL HIGH (ref 4.0–10.5)
nRBC: 0.2 % (ref 0.0–0.2)

## 2020-12-02 LAB — COMPREHENSIVE METABOLIC PANEL
ALT: 37 U/L (ref 0–44)
AST: 13 U/L — ABNORMAL LOW (ref 15–41)
Albumin: 3.5 g/dL (ref 3.5–5.0)
Alkaline Phosphatase: 78 U/L (ref 38–126)
Anion gap: 15 (ref 5–15)
BUN: 86 mg/dL — ABNORMAL HIGH (ref 8–23)
CO2: 21 mmol/L — ABNORMAL LOW (ref 22–32)
Calcium: 8.5 mg/dL — ABNORMAL LOW (ref 8.9–10.3)
Chloride: 102 mmol/L (ref 98–111)
Creatinine, Ser: 10.13 mg/dL — ABNORMAL HIGH (ref 0.44–1.00)
GFR, Estimated: 4 mL/min — ABNORMAL LOW (ref >60)
Glucose, Bld: 348 mg/dL — ABNORMAL HIGH (ref 70–99)
Potassium: 4.6 mmol/L (ref 3.5–5.1)
Sodium: 138 mmol/L (ref 135–145)
Total Bilirubin: 0.9 mg/dL (ref 0.3–1.2)
Total Protein: 6.9 g/dL (ref 6.5–8.1)

## 2020-12-02 LAB — LACTIC ACID, PLASMA
Lactic Acid, Venous: 1.5 mmol/L (ref 0.5–1.9)
Lactic Acid, Venous: 1.6 mmol/L (ref 0.5–1.9)

## 2020-12-02 LAB — FERRITIN: Ferritin: 1402 ng/mL — ABNORMAL HIGH (ref 11–307)

## 2020-12-02 LAB — C-REACTIVE PROTEIN: CRP: 20.1 mg/dL — ABNORMAL HIGH (ref <1.0)

## 2020-12-02 LAB — FIBRINOGEN: Fibrinogen: 622 mg/dL — ABNORMAL HIGH (ref 210–475)

## 2020-12-02 LAB — PROCALCITONIN: Procalcitonin: 1.57 ng/mL

## 2020-12-02 LAB — RESP PANEL BY RT-PCR (FLU A&B, COVID) ARPGX2
Influenza A by PCR: NEGATIVE
Influenza B by PCR: NEGATIVE
SARS Coronavirus 2 by RT PCR: NEGATIVE

## 2020-12-02 LAB — LACTATE DEHYDROGENASE: LDH: 347 U/L — ABNORMAL HIGH (ref 98–192)

## 2020-12-02 LAB — TRIGLYCERIDES: Triglycerides: 259 mg/dL — ABNORMAL HIGH (ref <150)

## 2020-12-02 MED ORDER — DOXYCYCLINE HYCLATE 100 MG PO TABS
100.0000 mg | ORAL_TABLET | Freq: Once | ORAL | Status: AC
  Administered 2020-12-02: 100 mg via ORAL
  Filled 2020-12-02: qty 1

## 2020-12-02 MED ORDER — CEPHALEXIN 500 MG PO CAPS: 500.0000 mg | ORAL_CAPSULE | Freq: Two times a day (BID) | ORAL | 0 refills | Status: DC

## 2020-12-02 MED ORDER — PHENOL 1.4 % MT LIQD
1.0000 | OROMUCOSAL | 0 refills | Status: DC | PRN
Start: 2020-12-02 — End: 2021-07-13

## 2020-12-02 MED ORDER — LEVALBUTEROL HCL 1.25 MG/0.5ML IN NEBU
1.2500 mg | INHALATION_SOLUTION | Freq: Once | RESPIRATORY_TRACT | Status: AC
  Administered 2020-12-02: 1.25 mg via RESPIRATORY_TRACT
  Filled 2020-12-02: qty 0.5

## 2020-12-02 MED ORDER — METHYLPREDNISOLONE SODIUM SUCC 125 MG IJ SOLR
125.0000 mg | Freq: Once | INTRAMUSCULAR | Status: AC
  Administered 2020-12-02: 125 mg via INTRAVENOUS
  Filled 2020-12-02: qty 2

## 2020-12-02 MED ORDER — SODIUM CHLORIDE 0.9 % IV SOLN
2.0000 g | Freq: Once | INTRAVENOUS | Status: AC
  Administered 2020-12-02: 2 g via INTRAVENOUS
  Filled 2020-12-02: qty 20

## 2020-12-02 MED ORDER — PREDNISONE 10 MG PO TABS: 50.0000 mg | ORAL_TABLET | Freq: Every day | ORAL | 0 refills | Status: DC

## 2020-12-02 MED ORDER — LEVALBUTEROL TARTRATE 45 MCG/ACT IN AERO
2.0000 | INHALATION_SPRAY | RESPIRATORY_TRACT | Status: AC
  Administered 2020-12-02 (×2): 2 via RESPIRATORY_TRACT
  Filled 2020-12-02 (×2): qty 15

## 2020-12-02 MED ORDER — CORICIDIN HBP COUGH/COLD 4-30 MG PO TABS: 1.0000 | ORAL_TABLET | Freq: Four times a day (QID) | ORAL | 0 refills | Status: DC | PRN

## 2020-12-02 MED ORDER — DOXYCYCLINE HYCLATE 100 MG PO CAPS
100.0000 mg | ORAL_CAPSULE | Freq: Two times a day (BID) | ORAL | 0 refills | Status: DC
Start: 2020-12-02 — End: 2021-01-02

## 2020-12-02 MED ORDER — LEVALBUTEROL HCL 1.25 MG/0.5ML IN NEBU: 1.2500 mg | INHALATION_SOLUTION | RESPIRATORY_TRACT | 0 refills | Status: DC | PRN

## 2020-12-02 NOTE — ED Triage Notes (Signed)
Pt c/o sore throat, shortness of breath,fever, headache, and bilateral ear pain x 2-3 days

## 2020-12-02 NOTE — Progress Notes (Signed)
Acute Office Visit  Subjective:    Patient ID: Kathryn Beck, female    DOB: [birth year], 65 y.o.   MRN: [id number]  Chief Complaint  Patient presents with   Cough    Productive cough, ongoing x3 days   Sore Throat    Hurts to swallow, ongoing x3 days   Fever    Unsure of temp but having body aches    Cough Associated symptoms include chills, ear pain, a fever, myalgias, rhinorrhea, a sore throat and shortness of breath.  Sore Throat  Associated symptoms include congestion, coughing, ear pain and shortness of breath.  Fever  Associated symptoms include congestion, coughing, ear pain and a sore throat.  Patient is in today for sick. Symptoms started 3 days ago.  Her granddaughter has a cold currently.  Past Medical History:  Diagnosis Date   Acid reflux    Anemia    Arthritis    Axillary masses    Soft tissue - status post excision   Back pain    COVID-19 virus infection 04/06/2019   Depression    End-stage renal disease (HCC)    [facility]  in [place] hypertension    History of blood transfusion    History of cardiac catheterization    Normal coronary arteries October 2020   History of claustrophobia    History of pneumonia 2019   Hypoxia 04/03/2019   Mixed hyperlipidemia    Obesity    Pancreatitis    Peritoneal dialysis catheter in place [facility])    Pneumonia due to COVID-19 virus 04/02/2019   Sleep apnea    Noncompliant with CPAP   Type 2 diabetes mellitus ([place])     Past Surgical History:  Procedure Laterality Date   ABDOMINAL HYSTERECTOMY     AV FISTULA PLACEMENT Left 09/02/2017   Procedure: creation of left arm ARTERIOVENOUS (AV) FISTULA;  Surgeon: Serafina Mitchell, MD;  Location: [facility];  Service: Vascular;  Laterality: Left;   COLONOSCOPY  2008   Dr. Oneida Alar: normal    COLONOSCOPY N/A 12/18/2016   Dr. Oneida Alar: multiple tubular adenomas, internal hemorrhoids. Surveillance in 3 years    ESOPHAGEAL DILATION N/A 10/13/2015   Procedure: ESOPHAGEAL  DILATION;  Surgeon: Rogene Houston, MD;  [facility];  Service: Endoscopy;  Laterality: N/A;   ESOPHAGOGASTRODUODENOSCOPY N/A 10/13/2015   Dr. Laural Golden: chronic gastritis on path, no H.pylori. Empiric dilation    ESOPHAGOGASTRODUODENOSCOPY N/A 12/18/2016   Dr. Oneida Alar: mild gastritis. BRAVO study revealed uncontrolled GERD. Dysphagia secondary to uncontrolled reflux   FOOT SURGERY Bilateral    "nerve"     LEFT HEART CATH AND CORONARY ANGIOGRAPHY N/A 12/29/2018   Procedure: LEFT HEART CATH AND CORONARY ANGIOGRAPHY;  Surgeon: Jettie Booze, MD;  Location: [place] [facility];  Service: Cardiovascular;  Laterality: N/A;   LUNG BIOPSY     MASS EXCISION Right 01/09/2013   Procedure: EXCISION OF NEOPLASM OF RIGHT  AXILLA  AND EXCISION OF NEOPLASM OF LEFT AXILLA;  Surgeon: Jamesetta So, MD;  Location: [facility];  Service: General;  Laterality: Right;  procedure end @ 08:23   MYRINGOTOMY WITH TUBE PLACEMENT Bilateral 04/28/2017   Procedure: BILATERAL MYRINGOTOMY WITH TUBE PLACEMENT;  Surgeon: Leta Baptist, MD;  Location: [facility];  Service: ENT;  Laterality: Bilateral;   REVISION OF ARTERIOVENOUS GORETEX GRAFT Left 05/04/2018   Procedure: TRANSPOSITION OF CEPHALIC VEIN ARTERIOVENOUS FISTULA LEFT ARM;  Surgeon: Rosetta Posner, MD;  Location: [place];  Service: Vascular;  Laterality: Left;   SAVORY DILATION N/A 12/18/2016   Procedure: SAVORY DILATION;  Surgeon: Danie Binder, MD;  [facility];  Service: Endoscopy;  Laterality: N/A;    Family History  Problem Relation Age of Onset   Hypertension Father    Hypercholesterolemia Father    Arthritis Father    Hypertension Sister    Hypercholesterolemia Sister    Breast cancer Sister    Hypertension Sister    Colon cancer Neg Hx    Colon polyps Neg Hx     Social History   Socioeconomic History   Marital status: Married    Spouse name: larry    Number of children: 2   Years of education: 12   Highest education level: 12th grade   Occupational History   Occupation: retired   Tobacco Use   Smoking status: Never   Smokeless tobacco: Never  Scientific laboratory technician Use: Never used  Substance and Sexual Activity   Alcohol use: No   Drug use: No   Sexual activity: Not Currently  Other Topics Concern   Not on file  Social History Narrative   Lives alone with husband    Social Determinants of Health   Financial Resource Strain: Not on file  Food Insecurity: Not on file  Transportation Needs: Not on file  Physical Activity: Not on file  Stress: Not on file  Social Connections: Not on file  Intimate Partner Violence: Not on file    Outpatient Medications Prior to Visit  Medication Sig Dispense Refill   amLODipine (NORVASC) 5 MG tablet TAKE 1 TABLET BY MOUTH ONCE DAILY. 28 tablet 11   aspirin 81 MG chewable tablet CHEW 2 TABLETS BY MOUTH ONCE DAILY 56 tablet 11   B Complex-C-Folic Acid (RENA-VITE RX) 1 MG TABS Take 1 tablet by mouth daily.     calcitRIOL (ROCALTROL) 0.25 MCG capsule Take 0.25 capsules by mouth.     cloNIDine (CATAPRES) 0.2 MG tablet Take 0.2 mg by mouth 2 (two) times daily.     Continuous Blood Gluc Sensor (FREESTYLE LIBRE 2 SENSOR) MISC 1 each by Does not apply route every 14 (fourteen) days. 6 each 3   cyclobenzaprine (FLEXERIL) 5 MG tablet TAKE ONE TABLET BY MOUTH AT BEDTIME AS NEEDED FOR MUSCLE SPASMS. 30 tablet 0   DULoxetine (CYMBALTA) 60 MG capsule TAKE (1) CAPSULE BY MOUTH ONCE DAILY. 28 capsule 11   ezetimibe (ZETIA) 10 MG tablet TAKE 1 TABLET BY MOUTH ONCE DAILY. 28 tablet 11   HUMALOG KWIKPEN 200 UNIT/ML KwikPen INJECT 24-28 UNITS S.Q. THREE TIMES A DAY BEFORE MEALS. 12 mL 11   HYDROcodone-acetaminophen (NORCO/VICODIN) 5-325 MG tablet Take 1 tablet by mouth every 6 (six) hours as needed for moderate pain. 30 tablet 0   Insulin Glargine, 2 Unit Dial, (TOUJEO MAX SOLOSTAR) 300 UNIT/ML SOPN Inject 50-56 Units into the skin See admin instructions. Take after dialysis 3 day a week. 3 pen 11    irbesartan (AVAPRO) 150 MG tablet SMARTSIG:1 Tablet(s) By Mouth Every Evening     isosorbide mononitrate (IMDUR) 30 MG 24 hr tablet TAKE 1 TABLET BY MOUTH ONCE DAILY. 28 tablet 11   Melatonin 3 MG TBDP Take one tablet by mouth at bedtime , for sleep 30 tablet 2   metoprolol succinate (TOPROL-XL) 50 MG 24 hr tablet TAKE (1) TABLET BY MOUTH DAILY WITH FOOD 28 tablet 11   midodrine (PROAMATINE) 10 MG tablet TAKE 1 TABLET BY MOUTH TWICE DAILY 56 tablet 5  ondansetron (ZOFRAN) 4 MG tablet Take 1 tablet (4 mg total) by mouth every 8 (eight) hours as needed for nausea or vomiting. 20 tablet 0   pregabalin (LYRICA) 50 MG capsule TAKE (1) CAPSULE BY MOUTH THREE TIMES DAILY 84 capsule 3   sertraline (ZOLOFT) 100 MG tablet TAKE 1 TABLET BY MOUTH EVERY DAY 90 tablet 2   torsemide (DEMADEX) 100 MG tablet TAKE 1 TABLET BY MOUTH TWICE DAILY 56 tablet 11   triamcinolone cream (KENALOG) 0.1 % APPLY TO AFFECTED AREA TWICE A DAY 30 g 0   [employer] MINI PEN NEEDLES 31G X 6 MM MISC USE AS DIRECTED 100 each 0   [name] 500 MG chewable tablet Chew 500 mg by mouth See admin instructions. Take 500 mg with each snack and meal     zolpidem (AMBIEN) 5 MG tablet TAKE 1 TABLET BY MOUTH AT BEDTIME AS NEEDED. 30 tablet 0   Facility-Administered Medications Prior to Visit  Medication Dose Route Frequency Provider Last Rate Last Admin   0.9 %  sodium chloride infusion   Intravenous Continuous Lockamy, Randi L, NP-C 20 mL/hr at 07/19/19 1641 Rate Verify at 07/19/19 1641    Allergies  Allergen Reactions   Ace Inhibitors Anaphylaxis and Swelling   Penicillins Itching, Swelling and Other (See Comments)    Did it involve swelling of the face/tongue/throat, SOB, or low BP? Unknown Did it involve sudden or severe rash/hives, skin peeling, or any reaction on the inside of your mouth or nose? Unknown Did you need to seek medical attention at a hospital or doctor's office? Unknown When did it last happen?      years  If all above  answers are "NO", may proceed with cephalosporin use.    Statins Other (See Comments)    elevated LFT's     Albuterol Swelling    Review of Systems  Constitutional:  Positive for chills, fatigue and fever.  HENT:  Positive for congestion, ear pain, rhinorrhea and sore throat. Negative for sinus pressure and sinus pain.   Respiratory:  Positive for cough and shortness of breath.   Musculoskeletal:  Positive for myalgias.      Objective:    Physical Exam  There were no vitals taken for this visit. Wt Readings from Last 3 Encounters:  11/21/20 206 lb (93.4 kg)  11/14/20 199 lb 15.3 oz (90.7 kg)  10/28/20 201 lb (91.2 kg)    Health Maintenance Due  Topic Date Due   Pneumococcal Vaccine 80-49 Years old (3 - PPSV23 or PCV20) 07/24/2016   Zoster Vaccines- Shingrix (2 of 2) 02/18/2017   COVID-19 Vaccine (4 - Booster for Moderna series) 07/23/2020   INFLUENZA VACCINE  10/21/2020    There are no preventive care reminders to display for this patient.   Lab Results  Component Value Date   TSH 4.16 12/24/2016   Lab Results  Component Value Date   WBC 6.8 11/05/2020   HGB 10.7 (L) 11/05/2020   HCT 34.9 (L) 11/05/2020   MCV 84.5 11/05/2020   PLT 243 11/05/2020   Lab Results  Component Value Date   NA 136 11/05/2020   K 4.1 11/21/2020   CO2 23 11/05/2020   GLUCOSE 253 (H) 11/05/2020   BUN 54 (H) 11/05/2020   CREATININE 7.69 (H) 11/05/2020   BILITOT 0.7 11/05/2020   ALKPHOS 69 11/05/2020   AST 23 11/05/2020   ALT 45 (H) 11/05/2020   PROT 6.7 11/05/2020   ALBUMIN 3.6 11/05/2020   CALCIUM 8.6 (L)  11/05/2020   ANIONGAP 13 11/05/2020   Lab Results  Component Value Date   CHOL 190 11/21/2020   Lab Results  Component Value Date   HDL 43.10 11/21/2020   Lab Results  Component Value Date   LDLCALC 114 (H) 03/08/2018   Lab Results  Component Value Date   TRIG 272.0 (H) 11/21/2020   Lab Results  Component Value Date   CHOLHDL 4 11/21/2020   Lab Results   Component Value Date   HGBA1C 9.1 (A) 10/28/2020       Assessment & Plan:   Problem List Items Addressed This Visit       Respiratory   URI (upper respiratory infection) - Primary    -will get COVID test -Rx. Coricidin -Rx. Chloraseptic throat spray -no steroids today d/t DM -granddaughter is sick, so will r/o COVID before deciding on abx or antiviral -she has ESRD and is on hemodialysis      Relevant Medications   Chlorpheniramine-DM (CORICIDIN COUGH/COLD) 4-30 MG TABS   phenol (CHLORASEPTIC) 1.4 % LIQD   Other Relevant Orders   Novel Coronavirus, NAA ([facility])     Meds ordered this encounter  Medications   Chlorpheniramine-DM (CORICIDIN COUGH/COLD) 4-30 MG TABS    Sig: Take 1 tablet by mouth every 6 (six) hours as needed.    Dispense:  28 tablet    Refill:  0   phenol (CHLORASEPTIC) 1.4 % LIQD    Sig: Use as directed 1 spray in the mouth or throat as needed for throat irritation / pain.    Dispense:  177 mL    Refill:  0   Date:  12/02/2020   Location of Patient: Home Location of Provider: Office Consent was obtain for visit to be over via telehealth. I verified that I am speaking with the correct person using two identifiers.  I connected with  Waldon Reining on 12/02/20 via telephone and verified that I am speaking with the correct person using two identifiers.   I discussed the limitations of evaluation and management by telemedicine. The patient expressed understanding and agreed to proceed.  Time spent: 8 min  Noreene Larsson, NP

## 2020-12-02 NOTE — Telephone Encounter (Signed)
error 

## 2020-12-02 NOTE — ED Notes (Signed)
Patient went to dialysis today and did not receive treatment due to having a fever.  Patient gets dialysis on M/W/F.

## 2020-12-02 NOTE — ED Provider Notes (Signed)
[facility] Provider Note   CSN: [id number] Arrival date & time: 12/02/20  1458     History Chief Complaint  Patient presents with   Sore Throat    Kathryn Beck is a 65 y.o. female.  HPI     65 year old comes in with chief complaint of sore throat. She has history of diabetes, ESRD on peritoneal dialysis and prior pneumonia.  She also had COVID-19 earlier this year.  Patient states that she has been having shortness of breath, fevers, sore throat, earache for the past 2 or 3 days.  She had gone to her PCP and initially not started on any medications, but her symptoms have worsened.  Her cough is producing green phlegm.  Patient is wheezing.  She does not have any inhalers at home. She has chest pain with deep inspiration and with cough and feels similar to her prior pneumonia.  No history of PE, DVT.  Past Medical History:  Diagnosis Date   Acid reflux    Anemia    Arthritis    Axillary masses    Soft tissue - status post excision   Back pain    COVID-19 virus infection 04/06/2019   Depression    End-stage renal disease ([place])    TTHSat  in [place] hypertension    History of blood transfusion    History of cardiac catheterization    Normal coronary arteries October 2020   History of claustrophobia    History of pneumonia 2019   Hypoxia 04/03/2019   Mixed hyperlipidemia    Obesity    Pancreatitis    Peritoneal dialysis catheter in place [place])    Pneumonia due to COVID-19 virus 04/02/2019   Sleep apnea    Noncompliant with CPAP   Type 2 diabetes mellitus ([place])     Patient Active Problem List   Diagnosis Date Noted   URI (upper respiratory infection) 12/02/2020   Subungual hematoma of second toe of left foot 10/28/2020   Insomnia 10/28/2020   Memory loss 09/26/2020   Expressive aphasia 09/26/2020   Forgetfulness 09/26/2020   Recurrent vertigo 09/26/2020   Impacted cerumen of both ears 09/26/2020   Abnormal MRI, cervical  spine 03/04/2020   Right leg weakness 02/28/2020   Knee pain, right 10/23/2019   Cough 08/09/2019   Carpal tunnel syndrome 06/13/2019   Chronic pain syndrome 06/13/2019   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[place]) 06/13/2019   Neck pain 03/23/2019   Stage 5 chronic kidney disease on chronic dialysis ([place]) 12/08/2018   Weakness 12/05/2018   Near syncope 12/05/2018   ESRD on hemodialysis ([place]) 11/22/2018   Ischemic heart disease 09/25/2018   Renal disease 08/22/2018   Not currently working due to disabled status 06/13/2018   ESRD on dialysis ([place]) 06/12/2018   Hypertension associated with diabetes ([place]) 06/12/2018   Depression, major, single episode, severe ([place]) 03/09/2018   Adrenal mass, left ([place]) 11/09/2017   MGUS (monoclonal gammopathy of unknown significance) 11/05/2017   Shoulder pain 11/01/2017   Chronic diastolic (congestive) heart failure ([place]) 09/20/2017   Bilateral leg weakness 09/09/2017   Adrenal adenoma 09/03/2017   Uncontrolled type 2 diabetes mellitus ([place]) 08/18/2017   Unsteady gait 07/11/2017   Recurrent falls 07/11/2017   Anemia of chronic disease 03/24/2017   Personal history of colonic polyps 02/10/2017   Dysphagia 11/05/2016   Irritable bowel syndrome 02/04/2016   Intolerant of heat 10/07/2014   Cardiac murmur 01/17/2014   Back pain  with left-sided radiculopathy 09/18/2013   Seasonal allergies 03/10/2013   Lipoma of back 12/22/2012   Other seasonal allergic rhinitis 08/22/2012   Sleep apnea 06/02/2011   Chronic kidney disease, stage 4 (severe) ([place]) 01/13/2011   Neuropathy 04/16/2010   Malaise and fatigue 07/24/2009   Personal history of other diseases of the nervous system and sense organs 05/08/2009   LUPUS ERYTHEMATOSUS, DISCOID 06/05/2008   Arm pain 05/30/2008   Hyperlipidemia 06/07/2007   Obesity 06/07/2007   Malignant hypertension 06/07/2007   Uncontrolled type 2 diabetes mellitus with ESRD (end-stage renal disease) ([place])  06/07/2007   Diabetes mellitus ([place]) 06/07/2007    Past Surgical History:  Procedure Laterality Date   ABDOMINAL HYSTERECTOMY     AV FISTULA PLACEMENT Left 09/02/2017   Procedure: creation of left arm ARTERIOVENOUS (AV) FISTULA;  Surgeon: Serafina Mitchell, MD;  Location: [facility];  Service: Vascular;  Laterality: Left;   COLONOSCOPY  2008   Dr. Oneida Alar: normal    COLONOSCOPY N/A 12/18/2016   Dr. Oneida Alar: multiple tubular adenomas, internal hemorrhoids. Surveillance in 3 years    ESOPHAGEAL DILATION N/A 10/13/2015   Procedure: ESOPHAGEAL DILATION;  Surgeon: Rogene Houston, MD;  [facility];  Service: Endoscopy;  Laterality: N/A;   ESOPHAGOGASTRODUODENOSCOPY N/A 10/13/2015   Dr. Laural Golden: chronic gastritis on path, no H.pylori. Empiric dilation    ESOPHAGOGASTRODUODENOSCOPY N/A 12/18/2016   Dr. Oneida Alar: mild gastritis. BRAVO study revealed uncontrolled GERD. Dysphagia secondary to uncontrolled reflux   FOOT SURGERY Bilateral    "nerve"     LEFT HEART CATH AND CORONARY ANGIOGRAPHY N/A 12/29/2018   Procedure: LEFT HEART CATH AND CORONARY ANGIOGRAPHY;  Surgeon: Jettie Booze, MD;  Location: [facility];  Service: Cardiovascular;  Laterality: N/A;   LUNG BIOPSY     MASS EXCISION Right 01/09/2013   Procedure: EXCISION OF NEOPLASM OF RIGHT  AXILLA  AND EXCISION OF NEOPLASM OF LEFT AXILLA;  Surgeon: Jamesetta So, MD;  Location: [facility];  Service: General;  Laterality: Right;  procedure end @ 08:23   MYRINGOTOMY WITH TUBE PLACEMENT Bilateral 04/28/2017   Procedure: BILATERAL MYRINGOTOMY WITH TUBE PLACEMENT;  Surgeon: Leta Baptist, MD;  Location: [facility];  Service: ENT;  Laterality: Bilateral;   REVISION OF ARTERIOVENOUS GORETEX GRAFT Left 05/04/2018   Procedure: TRANSPOSITION OF CEPHALIC VEIN ARTERIOVENOUS FISTULA LEFT ARM;  Surgeon: Rosetta Posner, MD;  Location: [facility];  Service: Vascular;  Laterality: Left;   SAVORY DILATION N/A 12/18/2016   Procedure: SAVORY DILATION;  Surgeon: Danie Binder, MD;  [facility];  Service: Endoscopy;  Laterality: N/A;     OB History   No obstetric history on file.     Family History  Problem Relation Age of Onset   Hypertension Father    Hypercholesterolemia Father    Arthritis Father    Hypertension Sister    Hypercholesterolemia Sister    Breast cancer Sister    Hypertension Sister    Colon cancer Neg Hx    Colon polyps Neg Hx     Social History   Tobacco Use   Smoking status: Never   Smokeless tobacco: Never  Vaping Use   Vaping Use: Never used  Substance Use Topics   Alcohol use: No   Drug use: No    Home Medications Prior to Admission medications   Medication Sig Start Date End Date Taking? Authorizing Provider  cephALEXin (KEFLEX) 500 MG capsule Take 1 capsule (500 mg total) by mouth 2 (  two) times daily. 12/02/20  Yes Varney Biles, MD  doxycycline (VIBRAMYCIN) 100 MG capsule Take 1 capsule (100 mg total) by mouth 2 (two) times daily. 12/02/20  Yes Varney Biles, MD  levalbuterol (XOPENEX) 1.25 MG/0.5ML nebulizer solution Take 1.25 mg by nebulization every 4 (four) hours as needed for wheezing or shortness of breath. 12/02/20  Yes Milee Qualls, MD  amLODipine (NORVASC) 5 MG tablet TAKE 1 TABLET BY MOUTH ONCE DAILY. 01/11/20   Fayrene Helper, MD  aspirin 81 MG chewable tablet CHEW 2 TABLETS BY MOUTH ONCE DAILY 09/17/20   Fayrene Helper, MD  B Complex-C-Folic Acid (RENA-VITE RX) 1 MG TABS Take 1 tablet by mouth daily. 11/13/20   [provider]  calcitRIOL (ROCALTROL) 0.25 MCG capsule Take 0.25 capsules by mouth. 07/13/19   [provider]  Chlorpheniramine-DM (CORICIDIN COUGH/COLD) 4-30 MG TABS Take 1 tablet by mouth every 6 (six) hours as needed. 12/02/20   Noreene Larsson, NP  cloNIDine (CATAPRES) 0.2 MG tablet Take 0.2 mg by mouth 2 (two) times daily. 11/13/20   [provider]  Continuous Blood Gluc Sensor (FREESTYLE LIBRE 2 SENSOR) MISC 1 each by Does not apply  route every 14 (fourteen) days. 11/21/20   [name], MD  cyclobenzaprine (FLEXERIL) 5 MG tablet TAKE ONE TABLET BY MOUTH AT BEDTIME AS NEEDED FOR MUSCLE SPASMS. 02/20/20   Fayrene Helper, MD  DULoxetine (CYMBALTA) 60 MG capsule TAKE (1) CAPSULE BY MOUTH ONCE DAILY. 09/17/20   Fayrene Helper, MD  ezetimibe (ZETIA) 10 MG tablet TAKE 1 TABLET BY MOUTH ONCE DAILY. 11/14/20   Fayrene Helper, MD  HUMALOG KWIKPEN 200 UNIT/ML KwikPen INJECT 24-28 UNITS S.Q. THREE TIMES A DAY BEFORE MEALS. 10/17/20   Fayrene Helper, MD  HYDROcodone-acetaminophen (NORCO/VICODIN) 5-325 MG tablet Take 1 tablet by mouth every 6 (six) hours as needed for moderate pain. 12/14/19   Carole Civil, MD  Insulin Glargine, 2 Unit Dial, (TOUJEO MAX SOLOSTAR) 300 UNIT/ML SOPN Inject 50-56 Units into the skin See admin instructions. Take after dialysis 3 day a week. 04/18/19   [name], MD  irbesartan (AVAPRO) 150 MG tablet SMARTSIG:1 Tablet(s) By Mouth Every Evening 10/23/20   [provider]  isosorbide mononitrate (IMDUR) 30 MG 24 hr tablet TAKE 1 TABLET BY MOUTH ONCE DAILY. 11/14/20   Fayrene Helper, MD  Melatonin 3 MG TBDP Take one tablet by mouth at bedtime , for sleep 10/23/20   Fayrene Helper, MD  metoprolol succinate (TOPROL-XL) 50 MG 24 hr tablet TAKE (1) TABLET BY MOUTH DAILY WITH FOOD 05/06/20   Fayrene Helper, MD  midodrine (PROAMATINE) 10 MG tablet TAKE 1 TABLET BY MOUTH TWICE DAILY 10/26/19   Fayrene Helper, MD  ondansetron (ZOFRAN) 4 MG tablet Take 1 tablet (4 mg total) by mouth every 8 (eight) hours as needed for nausea or vomiting. 09/20/19   Fayrene Helper, MD  phenol (CHLORASEPTIC) 1.4 % LIQD Use as directed 1 spray in the mouth or throat as needed for throat irritation / pain. 12/02/20   Noreene Larsson, NP  pregabalin (LYRICA) 50 MG capsule TAKE (1) CAPSULE BY MOUTH THREE TIMES DAILY 11/11/20   Carole Civil, MD  sertraline (ZOLOFT) 100 MG tablet TAKE 1  TABLET BY MOUTH EVERY DAY 10/16/20   Fayrene Helper, MD  torsemide (DEMADEX) 100 MG tablet TAKE 1 TABLET BY MOUTH TWICE DAILY 09/17/20   Fayrene Helper, MD  triamcinolone cream (KENALOG) 0.1 %  APPLY TO AFFECTED AREA TWICE A DAY 10/31/20   Lindell Spar, MD  ULTICARE MINI PEN NEEDLES 31G X 6 MM MISC USE AS DIRECTED 10/17/20   Fayrene Helper, MD  [name] 500 MG chewable tablet Chew 500 mg by mouth See admin instructions. Take 500 mg with each snack and meal 11/07/18   [provider]  zolpidem (AMBIEN) 5 MG tablet TAKE 1 TABLET BY MOUTH AT BEDTIME AS NEEDED. 08/27/20   Fayrene Helper, MD  FLUoxetine (PROZAC) 10 MG capsule Take 10 mg by mouth daily.    05/28/11  [provider]  glipiZIDE (GLUCOTROL) 10 MG tablet Take 10 mg by mouth 2 (two) times daily before a meal.    05/28/11  [provider]    Allergies    Ace inhibitors, Penicillins, Statins, and Albuterol  Review of Systems   Review of Systems  Constitutional:  Positive for activity change.  Respiratory:  Positive for shortness of breath.   Cardiovascular:  Positive for chest pain.  Gastrointestinal:  Negative for nausea and vomiting.  Allergic/Immunologic: Negative for immunocompromised state.  Hematological:  Does not bruise/bleed easily.  All other systems reviewed and are negative.  Physical Exam Updated Vital Signs BP (!) 178/77 (BP Location: Right Arm)   Pulse 81   Temp 99 F (37.2 C) (Oral)   Resp 18   Ht 5\' 4"  (1.626 m)   Wt 89.8 kg   SpO2 96%   BMI 33.99 kg/m   Physical Exam Vitals and nursing note reviewed.  Constitutional:      Appearance: She is well-developed.  HENT:     Head: Atraumatic.     Mouth/Throat:     Tonsils: No tonsillar exudate or tonsillar abscesses.  Cardiovascular:     Rate and Rhythm: Normal rate.  Pulmonary:     Effort: Pulmonary effort is normal. No respiratory distress.     Breath sounds: Wheezing present. No rales.  Musculoskeletal:      Cervical back: Normal range of motion and neck supple.  Lymphadenopathy:     Cervical: Cervical adenopathy present.  Skin:    General: Skin is warm and dry.  Neurological:     Mental Status: She is alert and oriented to person, place, and time.    ED Results / Procedures / Treatments   Labs (all labs ordered are listed, but only abnormal results are displayed) Labs Reviewed  CBC WITH DIFFERENTIAL/PLATELET - Abnormal; Notable for the following components:      Result Value   WBC 12.1 (*)    Hemoglobin 10.6 (*)    HCT 35.3 (*)    MCH 25.5 (*)    RDW 17.8 (*)    Neutro Abs 9.5 (*)    Monocytes Absolute 1.2 (*)    All other components within normal limits  COMPREHENSIVE METABOLIC PANEL - Abnormal; Notable for the following components:   CO2 21 (*)    Glucose, Bld 348 (*)    BUN 86 (*)    Creatinine, Ser 10.13 (*)    Calcium 8.5 (*)    AST 13 (*)    GFR, Estimated 4 (*)    All other components within normal limits  LACTATE DEHYDROGENASE - Abnormal; Notable for the following components:   LDH 347 (*)    All other components within normal limits  FERRITIN - Abnormal; Notable for the following components:   Ferritin 1,402 (*)    All other components within normal limits  TRIGLYCERIDES - Abnormal; Notable for  the following components:   Triglycerides 259 (*)    All other components within normal limits  FIBRINOGEN - Abnormal; Notable for the following components:   Fibrinogen 622 (*)    All other components within normal limits  C-REACTIVE PROTEIN - Abnormal; Notable for the following components:   CRP 20.1 (*)    All other components within normal limits  RESP PANEL BY RT-PCR (FLU A&B, COVID) [id number]  LACTIC ACID, PLASMA  LACTIC ACID, PLASMA  PROCALCITONIN    EKG EKG Interpretation  Date/Time:  Monday December 02 2020 15:37:29 EDT Ventricular Rate:  93 PR Interval:  136 QRS Duration: 82 QT Interval:  356 QTC Calculation: 442 R Axis:   65 Text  Interpretation: Normal sinus rhythm Normal ECG No acute changes No significant change was found Confirmed by Varney Biles [phone]) on 12/02/2020 6:51:49 PM  Radiology DG Chest 2 View  Result Date: 12/02/2020 CLINICAL DATA:  Shortness of breath. EXAM: CHEST - 2 VIEW COMPARISON:  Chest x-ray 03/28/2019. FINDINGS: The aorta is tortuous. The cardiac silhouette is within normal limits. There are patchy airspace opacities in left mid and lower lung. There is no pleural effusion or pneumothorax. There increase central interstitial markings. Surgical clips are seen near the left shoulder. No acute fractures are identified. Degenerative changes affect the thoracic spine. IMPRESSION: 1. Patchy airspace opacities in the left mid and lower lung worrisome for infection. 2. Increased central markings may related to mild edema or infection/inflammation. 3. Follow-up chest x-ray recommended in 8-12 weeks to confirm complete resolution. Electronically Signed   By: Ronney Asters M.D.   On: 12/02/2020 16:18    Procedures .Critical Care Performed by: Varney Biles, MD Authorized by: Varney Biles, MD   Critical care provider statement:    Critical care time (minutes):  45   Critical care was necessary to treat or prevent imminent or life-threatening deterioration of the following conditions:  Respiratory failure   Critical care was time spent personally by me on the following activities:  Discussions with consultants, evaluation of patient's response to treatment, examination of patient, ordering and performing treatments and interventions, ordering and review of laboratory studies, ordering and review of radiographic studies, pulse oximetry, re-evaluation of patient's condition, obtaining history from patient or surrogate and review of old charts   Medications Ordered in ED Medications  levalbuterol (XOPENEX HFA) inhaler 2 puff (2 puffs Inhalation Given 12/02/20 1808)  levalbuterol (XOPENEX) nebulizer solution  1.25 mg (1.25 mg Nebulization Given 12/02/20 2055)  cefTRIAXone (ROCEPHIN) 2 g in sodium chloride 0.9 % 100 mL IVPB (0 g Intravenous Stopped 12/02/20 2117)  doxycycline (VIBRA-TABS) tablet 100 mg (100 mg Oral Given 12/02/20 2053)  methylPREDNISolone sodium succinate (SOLU-MEDROL) 125 mg/2 mL injection 125 mg (125 mg Intravenous Given 12/02/20 2318)    ED Course  I have reviewed the triage vital signs and the nursing notes.  Pertinent labs & imaging results that were available during my care of the patient were reviewed by me and considered in my medical decision making (see chart for details).    MDM Rules/Calculators/A&P                            65 year old comes in with chief complaint of sore throat and cough, with subjective fevers, chills.  On exam patient has diffuse wheezing, normal-appearing throat with mild cervical lymphadenopathy.  No rash.  She has history of ESRD -no evidence of overt volume overload.  No cardiac history  and denies any lung disease history as well.  With a constellation of symptoms, wheezing -differential diagnosis includes COVID-19, pulmonary edema, bacterial pneumonia.  PE is low in the differential diagnosis, but also a possibility.  Plan is to get basic labs, COVID-19 test.  Patient will be reassessed after inhaler treatment.  Reassessment: Patient's COVID-19 test is negative.  She has received Xopenex and feels better.  On repeat exam she has still mild wheezing.  X-rays reviewed independently, there is some left-sided lower lobe consolidation.  White count is slightly elevated.  No signs of DVT on exam.  We will give her antibiotics, nebulizer Xopenex and reassess.  Reassessment: Patient has received nebulized treatment and antibiotics.  She is feeling a lot better now.  Patient was ambulated with no hypoxia.  Strict ER return precautions have been discussed, and patient is agreeing with the plan and is comfortable with the workup done and the  recommendations from the ER.   Plan is to discharge her with antibiotics and Xopenex inhaler. I had ordered prednisone initially, thinking that she had COPD as well, but the history is not indicative of it.  We will discontinue the outpatient prescription for prednisone at this time.  Final Clinical Impression(s) / ED Diagnoses Final diagnoses:  Community acquired pneumonia of left lower lobe of lung  COPD with acute exacerbation ([place])    Rx / DC Orders ED Discharge Orders          Ordered    cephALEXin (KEFLEX) 500 MG capsule  2 times daily        12/02/20 2309    doxycycline (VIBRAMYCIN) 100 MG capsule  2 times daily        12/02/20 2309    predniSONE (DELTASONE) 10 MG tablet  Daily at bedtime,   Status:  Discontinued        12/02/20 2309    levalbuterol (XOPENEX) 1.25 MG/0.5ML nebulizer solution  Every 4 hours PRN        12/02/20 2309             Varney Biles, MD 12/02/20 2329

## 2020-12-02 NOTE — Discharge Instructions (Addendum)
You were seen in the ER for cough and shortness of breath. The work-up in the ER reveals likely a pneumonia.  We are starting you on antibiotics and also prescribing you inhaler and steroids.  Please follow-up with your primary care doctor in 1 week. Return to the ER immediately if the symptoms are getting worse.

## 2020-12-02 NOTE — Assessment & Plan Note (Addendum)
-  will get COVID test -Rx. Coricidin -Rx. Chloraseptic throat spray -no steroids today d/t DM -granddaughter is sick, so will r/o COVID before deciding on abx or antiviral -she has ESRD and is on hemodialysis

## 2020-12-02 NOTE — ED Notes (Signed)
Pt ambulated in hall. O2 93-97% Pt states some soreness of breath, but much better than than upon arrival today Pt had some weakness in knees, happening X2 days ( has occurred in the past)

## 2020-12-03 ENCOUNTER — Telehealth: Payer: 59 | Admitting: Nurse Practitioner

## 2020-12-03 ENCOUNTER — Other Ambulatory Visit: Payer: Self-pay

## 2020-12-04 LAB — CATECHOLAMINES, FRACTIONATED, PLASMA
Dopamine: <10 pg/mL
Epinephrine: <20 pg/mL
Norepinephrine: 332 pg/mL
Total Catecholamines: 332 pg/mL

## 2020-12-04 LAB — METANEPHRINES, PLASMA
Metanephrine, Free: <25 pg/mL (ref <=57)
Normetanephrine, Free: 56 pg/mL (ref <=148)
Total Metanephrines-Plasma: 56 pg/mL (ref <=205)

## 2020-12-04 LAB — ALDOSTERONE + RENIN ACTIVITY W/ RATIO
Aldosterone: <1 ng/dL
Renin Activity: 0.28 ng/mL/h (ref 0.25–5.82)

## 2020-12-04 LAB — NOVEL CORONAVIRUS, NAA: SARS-CoV-2, NAA: NOT DETECTED

## 2020-12-04 LAB — SARS-COV-2, NAA 2 DAY TAT

## 2020-12-04 NOTE — Progress Notes (Signed)
COVID is negative. If she is still having symptoms Thursday night/Friday morning, call and let me know and I may send in an antibiotic.

## 2020-12-10 ENCOUNTER — Encounter: Payer: Self-pay | Admitting: Internal Medicine

## 2020-12-17 ENCOUNTER — Telehealth: Payer: Self-pay

## 2020-12-17 NOTE — Telephone Encounter (Signed)
Eric advised dr Moshe Cipro will sign for social work and PT however her insurance did not cover an aide to come out for home health

## 2020-12-17 NOTE — Telephone Encounter (Signed)
Randall Hiss from [facility] called needs confirmation that Dr Moshe Cipro will sign paperwork for plan of care. Eric call back# [phone].

## 2020-12-18 DIAGNOSIS — R569 Unspecified convulsions: Secondary | ICD-10-CM | POA: Diagnosis not present

## 2020-12-20 DIAGNOSIS — N186 End stage renal disease: Secondary | ICD-10-CM | POA: Diagnosis not present

## 2020-12-20 DIAGNOSIS — Z992 Dependence on renal dialysis: Secondary | ICD-10-CM | POA: Diagnosis not present

## 2020-12-23 DIAGNOSIS — D631 Anemia in chronic kidney disease: Secondary | ICD-10-CM | POA: Diagnosis not present

## 2020-12-23 DIAGNOSIS — Z992 Dependence on renal dialysis: Secondary | ICD-10-CM | POA: Diagnosis not present

## 2020-12-23 DIAGNOSIS — N186 End stage renal disease: Secondary | ICD-10-CM | POA: Diagnosis not present

## 2020-12-23 DIAGNOSIS — D509 Iron deficiency anemia, unspecified: Secondary | ICD-10-CM | POA: Diagnosis not present

## 2020-12-23 DIAGNOSIS — N2581 Secondary hyperparathyroidism of renal origin: Secondary | ICD-10-CM | POA: Diagnosis not present

## 2020-12-24 ENCOUNTER — Telehealth: Payer: Self-pay | Admitting: Family Medicine

## 2020-12-24 NOTE — Telephone Encounter (Signed)
Verbal orders given  

## 2020-12-24 NOTE — Telephone Encounter (Signed)
Margaretha Seeds, PT from [facility] home [facility] called on behalf  of the patient  for Pt orders  Call back # [phone]

## 2020-12-25 DIAGNOSIS — D509 Iron deficiency anemia, unspecified: Secondary | ICD-10-CM | POA: Diagnosis not present

## 2020-12-25 DIAGNOSIS — D631 Anemia in chronic kidney disease: Secondary | ICD-10-CM | POA: Diagnosis not present

## 2020-12-25 DIAGNOSIS — Z992 Dependence on renal dialysis: Secondary | ICD-10-CM | POA: Diagnosis not present

## 2020-12-25 DIAGNOSIS — N2581 Secondary hyperparathyroidism of renal origin: Secondary | ICD-10-CM | POA: Diagnosis not present

## 2020-12-25 DIAGNOSIS — N186 End stage renal disease: Secondary | ICD-10-CM | POA: Diagnosis not present

## 2020-12-26 ENCOUNTER — Other Ambulatory Visit: Payer: Self-pay

## 2020-12-26 ENCOUNTER — Other Ambulatory Visit: Payer: Medicare Other

## 2020-12-26 ENCOUNTER — Other Ambulatory Visit: Payer: Self-pay | Admitting: Internal Medicine

## 2020-12-26 DIAGNOSIS — D3500 Benign neoplasm of unspecified adrenal gland: Secondary | ICD-10-CM

## 2020-12-27 DIAGNOSIS — Z992 Dependence on renal dialysis: Secondary | ICD-10-CM | POA: Diagnosis not present

## 2020-12-27 DIAGNOSIS — D631 Anemia in chronic kidney disease: Secondary | ICD-10-CM | POA: Diagnosis not present

## 2020-12-27 DIAGNOSIS — N2581 Secondary hyperparathyroidism of renal origin: Secondary | ICD-10-CM | POA: Diagnosis not present

## 2020-12-27 DIAGNOSIS — N186 End stage renal disease: Secondary | ICD-10-CM | POA: Diagnosis not present

## 2020-12-27 DIAGNOSIS — D509 Iron deficiency anemia, unspecified: Secondary | ICD-10-CM | POA: Diagnosis not present

## 2020-12-30 DIAGNOSIS — E119 Type 2 diabetes mellitus without complications: Secondary | ICD-10-CM | POA: Diagnosis not present

## 2020-12-30 DIAGNOSIS — D509 Iron deficiency anemia, unspecified: Secondary | ICD-10-CM | POA: Diagnosis not present

## 2020-12-30 DIAGNOSIS — N186 End stage renal disease: Secondary | ICD-10-CM | POA: Diagnosis not present

## 2020-12-30 DIAGNOSIS — Z794 Long term (current) use of insulin: Secondary | ICD-10-CM | POA: Diagnosis not present

## 2020-12-30 DIAGNOSIS — D631 Anemia in chronic kidney disease: Secondary | ICD-10-CM | POA: Diagnosis not present

## 2020-12-30 DIAGNOSIS — N2581 Secondary hyperparathyroidism of renal origin: Secondary | ICD-10-CM | POA: Diagnosis not present

## 2020-12-30 DIAGNOSIS — Z992 Dependence on renal dialysis: Secondary | ICD-10-CM | POA: Diagnosis not present

## 2021-01-01 DIAGNOSIS — N2581 Secondary hyperparathyroidism of renal origin: Secondary | ICD-10-CM | POA: Diagnosis not present

## 2021-01-01 DIAGNOSIS — Z992 Dependence on renal dialysis: Secondary | ICD-10-CM | POA: Diagnosis not present

## 2021-01-01 DIAGNOSIS — D631 Anemia in chronic kidney disease: Secondary | ICD-10-CM | POA: Diagnosis not present

## 2021-01-01 DIAGNOSIS — D509 Iron deficiency anemia, unspecified: Secondary | ICD-10-CM | POA: Diagnosis not present

## 2021-01-01 DIAGNOSIS — N186 End stage renal disease: Secondary | ICD-10-CM | POA: Diagnosis not present

## 2021-01-02 ENCOUNTER — Other Ambulatory Visit: Payer: Self-pay

## 2021-01-02 ENCOUNTER — Encounter (INDEPENDENT_AMBULATORY_CARE_PROVIDER_SITE_OTHER): Payer: Self-pay

## 2021-01-02 ENCOUNTER — Ambulatory Visit (INDEPENDENT_AMBULATORY_CARE_PROVIDER_SITE_OTHER): Payer: Medicare Other | Admitting: Family Medicine

## 2021-01-02 VITALS — BP 180/67 | HR 78 | Resp 19 | Ht 64.0 in | Wt 207.0 lb

## 2021-01-02 DIAGNOSIS — I5032 Chronic diastolic (congestive) heart failure: Secondary | ICD-10-CM

## 2021-01-02 DIAGNOSIS — I152 Hypertension secondary to endocrine disorders: Secondary | ICD-10-CM

## 2021-01-02 DIAGNOSIS — G894 Chronic pain syndrome: Secondary | ICD-10-CM | POA: Diagnosis not present

## 2021-01-02 DIAGNOSIS — J189 Pneumonia, unspecified organism: Secondary | ICD-10-CM

## 2021-01-02 DIAGNOSIS — Z23 Encounter for immunization: Secondary | ICD-10-CM | POA: Diagnosis not present

## 2021-01-02 DIAGNOSIS — E1159 Type 2 diabetes mellitus with other circulatory complications: Secondary | ICD-10-CM | POA: Diagnosis not present

## 2021-01-02 MED ORDER — AMLODIPINE BESYLATE 5 MG PO TABS: ORAL_TABLET | ORAL | 5 refills | Status: DC

## 2021-01-02 NOTE — Progress Notes (Signed)
Kathryn Beck     MRN: [id number]      DOB: 09/01/1955   HPI [name] is here for follow up of CAP in September treated in the ED States her cough and congestion is much improved , and she has had no fever or chills  C/o vaginal itching following antibiotic use.  ROS  Denies sinus pressure, nasal congestion, ear pain or sore throat. . Denies chest pains, palpitations and leg swelling Denies abdominal pain, nausea, vomiting,diarrhea or constipation.   Denies dysuria, frequency, hesitancy or incontinence. Denies uncontrolled  joint pain, swelling and limitation in mobility. Denies headaches, seizures, numbness, or tingling. Denies depression, anxiety or insomnia. Denies skin break down or rash.   PE  BP (!) 180/67   Pulse 78   Resp 19   Ht 5\' 4"  (1.626 m)   Wt 207 lb 0.6 oz (93.9 kg)   SpO2 95%   BMI 35.54 kg/m   Patient alert and oriented HEENT: No facial asymmetry, EOMI,     Neck supple .  Chest: adequate air entry few scattered crackles, no wheezes  CVS: S1, S2 no murmurs, no S3.Regular rate.  ABD: Soft non tender.   Ext: No edema  MS: decreased  ROM spine, shoulders, hips and knees.  Skin: Intact, no ulcerations or rash noted.  Psych: Good eye contact, normal affect. Memory intact not anxious or depressed appearing.  CNS: CN 2-12 intact, power,  normal throughout.no focal deficits noted.   Assessment & Plan  Community acquired pneumonia of left lung Marked improvement , needs rept CXR in 8 weeks   Chronic pain syndrome Needs to address pain management with Ortho or neurology, reporting need for narcotic short term  Chronic diastolic (congestive) heart failure (HCC) Stable asymptomatic, no s/s of failure  Hypertension associated with diabetes ([place]) Uncontrolled , inc amlodipine to 10 mg daily DASH diet and commitment to daily physical activity for a minimum of 30 minutes discussed and encouraged, as a part of hypertension management. The  importance of attaining a healthy weight is also discussed.  BP/Weight 01/02/2021 12/02/2020 11/21/2020 11/14/2020 10/28/2020 09/27/4126 09/27/6765  Systolic BP 209 470 962 836 629 476 546  Diastolic BP 67 77 82 69 98 67 72  Wt. (Lbs) 207.04 198 206 199.96 201 200.4 197  BMI 35.54 33.99 35.36 34.32 34.5 34.4 33.81       Uncontrolled type 2 diabetes mellitus with ESRD (end-stage renal disease) ([place]) Uncontrolled and managed by Endo [name] is reminded of the importance of commitment to daily physical activity for 30 minutes or more, as able and the need to limit carbohydrate intake to 30 to 60 grams per meal to help with blood sugar control.   The need to take medication as prescribed, test blood sugar as directed, and to call between visits if there is a concern that blood sugar is uncontrolled is also discussed.   [name] is reminded of the importance of daily foot exam, annual eye examination, and good blood sugar, blood pressure and cholesterol control.  Diabetic Labs Latest Ref Rng & Units 12/02/2020 11/21/2020 11/05/2020 10/28/2020 06/18/2020  HbA1c 4.0 - 5.6 % - - - 9.1(A) 8.4(A)  Microalbumin Not estab mg/dL - - - - -  Micro/Creat Ratio <30 mcg/mg creat - - - - -  Chol 0 - 200 mg/dL - 190 - - -  HDL >39.00 mg/dL - 43.10 - - -  Calc LDL 0 - 99 mg/dL - - - - -  Triglycerides <150 mg/dL 259(H) 272.0(H) - - -  Creatinine 0.44 - 1.00 mg/dL 10.13(H) - 7.69(H) - -   BP/Weight 01/02/2021 12/02/2020 11/21/2020 11/14/2020 10/28/2020 06/29/4718 09/22/1826  Systolic BP 833 744 514 604 799 872 158  Diastolic BP 67 77 82 69 98 67 72  Wt. (Lbs) 207.04 198 206 199.96 201 200.4 197  BMI 35.54 33.99 35.36 34.32 34.5 34.4 33.81   Foot/eye exam completion dates Latest Ref Rng & Units 08/07/2020 02/28/2020  Eye Exam No Retinopathy No Retinopathy -  Foot Form Completion - - Done

## 2021-01-02 NOTE — Patient Instructions (Addendum)
F/U in 2 months, re eval blood pressure, call if you need me sooner  CXR Nov 13 or shortly after , follow up pneumonia  Flu vaccine today  Increase amlodipine 5mg  to one two time daily for blood pressure, continue all other medications as before  Dr Merlene Laughter and Aline Brochure are treating you for pain, please discuss your need for hydrocodone with either one of them, I will not be prescribing this as you are already being managed by another provider for this  Please discuss suitability of an alternate medication for your diabetes wit your Endo Doctor  Careful not to fall  Thanks for choosing [facility], we consider it a privelige to serve you.

## 2021-01-03 DIAGNOSIS — D631 Anemia in chronic kidney disease: Secondary | ICD-10-CM | POA: Diagnosis not present

## 2021-01-03 DIAGNOSIS — N2581 Secondary hyperparathyroidism of renal origin: Secondary | ICD-10-CM | POA: Diagnosis not present

## 2021-01-03 DIAGNOSIS — D509 Iron deficiency anemia, unspecified: Secondary | ICD-10-CM | POA: Diagnosis not present

## 2021-01-03 DIAGNOSIS — Z992 Dependence on renal dialysis: Secondary | ICD-10-CM | POA: Diagnosis not present

## 2021-01-03 DIAGNOSIS — N186 End stage renal disease: Secondary | ICD-10-CM | POA: Diagnosis not present

## 2021-01-04 LAB — SALIVARY CORTISOL X2, TIMED
Salivary Cortisol 2nd Specimen: 0.634 ug/dL
Salivary Cortisol Baseline: 0.637 ug/dL

## 2021-01-06 ENCOUNTER — Telehealth: Payer: Self-pay | Admitting: Family Medicine

## 2021-01-06 ENCOUNTER — Encounter: Payer: Self-pay | Admitting: Family Medicine

## 2021-01-06 DIAGNOSIS — Z79899 Other long term (current) drug therapy: Secondary | ICD-10-CM | POA: Diagnosis not present

## 2021-01-06 DIAGNOSIS — G5603 Carpal tunnel syndrome, bilateral upper limbs: Secondary | ICD-10-CM | POA: Diagnosis not present

## 2021-01-06 DIAGNOSIS — N2581 Secondary hyperparathyroidism of renal origin: Secondary | ICD-10-CM | POA: Diagnosis not present

## 2021-01-06 DIAGNOSIS — E1142 Type 2 diabetes mellitus with diabetic polyneuropathy: Secondary | ICD-10-CM | POA: Diagnosis not present

## 2021-01-06 DIAGNOSIS — N186 End stage renal disease: Secondary | ICD-10-CM | POA: Diagnosis not present

## 2021-01-06 DIAGNOSIS — D631 Anemia in chronic kidney disease: Secondary | ICD-10-CM | POA: Diagnosis not present

## 2021-01-06 DIAGNOSIS — G894 Chronic pain syndrome: Secondary | ICD-10-CM | POA: Diagnosis not present

## 2021-01-06 DIAGNOSIS — R2689 Other abnormalities of gait and mobility: Secondary | ICD-10-CM | POA: Diagnosis not present

## 2021-01-06 DIAGNOSIS — G3184 Mild cognitive impairment, so stated: Secondary | ICD-10-CM | POA: Diagnosis not present

## 2021-01-06 DIAGNOSIS — R55 Syncope and collapse: Secondary | ICD-10-CM | POA: Diagnosis not present

## 2021-01-06 DIAGNOSIS — I6930 Unspecified sequelae of cerebral infarction: Secondary | ICD-10-CM | POA: Diagnosis not present

## 2021-01-06 DIAGNOSIS — D509 Iron deficiency anemia, unspecified: Secondary | ICD-10-CM | POA: Diagnosis not present

## 2021-01-06 DIAGNOSIS — R296 Repeated falls: Secondary | ICD-10-CM | POA: Diagnosis not present

## 2021-01-06 DIAGNOSIS — Z992 Dependence on renal dialysis: Secondary | ICD-10-CM | POA: Diagnosis not present

## 2021-01-06 NOTE — Assessment & Plan Note (Signed)
Stable asymptomatic, no s/s of failure

## 2021-01-06 NOTE — Assessment & Plan Note (Signed)
Uncontrolled , inc amlodipine to 10 mg daily DASH diet and commitment to daily physical activity for a minimum of 30 minutes discussed and encouraged, as a part of hypertension management. The importance of attaining a healthy weight is also discussed.  BP/Weight 01/02/2021 12/02/2020 11/21/2020 11/14/2020 10/28/2020 06/24/9673 11/22/6382  Systolic BP 665 993 570 177 939 030 092  Diastolic BP 67 77 82 69 98 67 72  Wt. (Lbs) 207.04 198 206 199.96 201 200.4 197  BMI 35.54 33.99 35.36 34.32 34.5 34.4 33.81

## 2021-01-06 NOTE — Assessment & Plan Note (Signed)
Needs to address pain management with Ortho or neurology, reporting need for narcotic short term

## 2021-01-06 NOTE — Assessment & Plan Note (Signed)
Marked improvement , needs rept CXR in 8 weeks

## 2021-01-06 NOTE — Telephone Encounter (Signed)
Natalie with [facility]  Called in behalf of pt  for verbal orders for OT

## 2021-01-06 NOTE — Assessment & Plan Note (Signed)
Uncontrolled and managed by Endo Ms. Troung is reminded of the importance of commitment to daily physical activity for 30 minutes or more, as able and the need to limit carbohydrate intake to 30 to 60 grams per meal to help with blood sugar control.   The need to take medication as prescribed, test blood sugar as directed, and to call between visits if there is a concern that blood sugar is uncontrolled is also discussed.   Ms. Sigmund is reminded of the importance of daily foot exam, annual eye examination, and good blood sugar, blood pressure and cholesterol control.  Diabetic Labs Latest Ref Rng & Units 12/02/2020 11/21/2020 11/05/2020 10/28/2020 06/18/2020  HbA1c 4.0 - 5.6 % - - - 9.1(A) 8.4(A)  Microalbumin Not estab mg/dL - - - - -  Micro/Creat Ratio <30 mcg/mg creat - - - - -  Chol 0 - 200 mg/dL - 190 - - -  HDL >39.00 mg/dL - 43.10 - - -  Calc LDL 0 - 99 mg/dL - - - - -  Triglycerides <150 mg/dL 259(H) 272.0(H) - - -  Creatinine 0.44 - 1.00 mg/dL 10.13(H) - 7.69(H) - -   BP/Weight 01/02/2021 12/02/2020 11/21/2020 11/14/2020 10/28/2020 04/28/4156 3/0/9407  Systolic BP 680 881 103 159 458 592 924  Diastolic BP 67 77 82 69 98 67 72  Wt. (Lbs) 207.04 198 206 199.96 201 200.4 197  BMI 35.54 33.99 35.36 34.32 34.5 34.4 33.81   Foot/eye exam completion dates Latest Ref Rng & Units 08/07/2020 02/28/2020  Eye Exam No Retinopathy No Retinopathy -  Foot Form Completion - - Done

## 2021-01-08 ENCOUNTER — Telehealth: Payer: Medicare Other | Admitting: Family Medicine

## 2021-01-08 DIAGNOSIS — N186 End stage renal disease: Secondary | ICD-10-CM | POA: Diagnosis not present

## 2021-01-08 DIAGNOSIS — N2581 Secondary hyperparathyroidism of renal origin: Secondary | ICD-10-CM | POA: Diagnosis not present

## 2021-01-08 DIAGNOSIS — D631 Anemia in chronic kidney disease: Secondary | ICD-10-CM | POA: Diagnosis not present

## 2021-01-08 DIAGNOSIS — Z992 Dependence on renal dialysis: Secondary | ICD-10-CM | POA: Diagnosis not present

## 2021-01-08 DIAGNOSIS — D509 Iron deficiency anemia, unspecified: Secondary | ICD-10-CM | POA: Diagnosis not present

## 2021-01-08 NOTE — Telephone Encounter (Signed)
Sent to endo

## 2021-01-08 NOTE — Telephone Encounter (Signed)
Melissa with [facility] [employer] called on behalf of pt for prescription changes

## 2021-01-08 NOTE — Telephone Encounter (Signed)
Pharmacy called and said that ins will no longer cover humalog 20mg  needs to change to novalog 100mg  and adjust units per [name] at [facility].

## 2021-01-09 ENCOUNTER — Telehealth: Payer: Self-pay | Admitting: Internal Medicine

## 2021-01-09 DIAGNOSIS — E1122 Type 2 diabetes mellitus with diabetic chronic kidney disease: Secondary | ICD-10-CM

## 2021-01-09 NOTE — Telephone Encounter (Signed)
T, Can you plz send this? Ty! C

## 2021-01-09 NOTE — Telephone Encounter (Signed)
Contacted pt in another encounter regarding medication change.

## 2021-01-09 NOTE — Telephone Encounter (Signed)
Called and spoke with pt's daughter who advised insurance no longer covers Humalog and requested Rx of Fiasp be sent to preferred pharmacy.

## 2021-01-09 NOTE — Telephone Encounter (Signed)
Patient's daughter Kathryn Beck/Patient requests to be called at ph# [phone] re: Medications Patient should be taking. Kathryn Beck states there is some confusion regarding what insulin Patient is being currently prescribed.

## 2021-01-10 DIAGNOSIS — D631 Anemia in chronic kidney disease: Secondary | ICD-10-CM | POA: Diagnosis not present

## 2021-01-10 DIAGNOSIS — N2581 Secondary hyperparathyroidism of renal origin: Secondary | ICD-10-CM | POA: Diagnosis not present

## 2021-01-10 DIAGNOSIS — Z992 Dependence on renal dialysis: Secondary | ICD-10-CM | POA: Diagnosis not present

## 2021-01-10 DIAGNOSIS — D509 Iron deficiency anemia, unspecified: Secondary | ICD-10-CM | POA: Diagnosis not present

## 2021-01-10 DIAGNOSIS — N186 End stage renal disease: Secondary | ICD-10-CM | POA: Diagnosis not present

## 2021-01-13 ENCOUNTER — Telehealth: Payer: Self-pay

## 2021-01-13 DIAGNOSIS — D631 Anemia in chronic kidney disease: Secondary | ICD-10-CM | POA: Diagnosis not present

## 2021-01-13 DIAGNOSIS — N2581 Secondary hyperparathyroidism of renal origin: Secondary | ICD-10-CM | POA: Diagnosis not present

## 2021-01-13 DIAGNOSIS — Z992 Dependence on renal dialysis: Secondary | ICD-10-CM | POA: Diagnosis not present

## 2021-01-13 DIAGNOSIS — N186 End stage renal disease: Secondary | ICD-10-CM | POA: Diagnosis not present

## 2021-01-13 DIAGNOSIS — D509 Iron deficiency anemia, unspecified: Secondary | ICD-10-CM | POA: Diagnosis not present

## 2021-01-13 NOTE — Telephone Encounter (Addendum)
Called daughter and requested pt call back to discuss results ----- Message from [name], MD sent at 01/10/2021  5:06 PM EDT ----- Can you please call pt.:  Her salivary cortisol levels are a little too high.  Reviewing her medication list, she appears to have been taken taking a vitamin with biotin: Rena-Vite Rx.  Did she take this in the day that she performed the cortisol test?  If so, how many micrograms of biotin does it contain?  Is she taking any other supplements with biotin?  Any other steroids? Also, was she using the Kenalog cream that day? Please let me know. Ty! C

## 2021-01-13 NOTE — Telephone Encounter (Signed)
Pt called back and confirmed she is taking supplements containing Biotin (she does not know the exact dose). Pt confirmed she did take her medication the day of the test. She did not take the Kenalog cream.

## 2021-01-14 ENCOUNTER — Other Ambulatory Visit: Payer: Self-pay | Admitting: Internal Medicine

## 2021-01-14 DIAGNOSIS — D3500 Benign neoplasm of unspecified adrenal gland: Secondary | ICD-10-CM

## 2021-01-14 MED ORDER — LYUMJEV KWIKPEN 200 UNIT/ML ~~LOC~~ SOPN: 24.0000 [IU] | PEN_INJECTOR | Freq: Three times a day (TID) | SUBCUTANEOUS | 1 refills | Status: DC

## 2021-01-14 NOTE — Telephone Encounter (Signed)
T, I reordered the test.  Let's  repeat the test with her being off the vitamins for 2 days and continue off Kenalog or any other steroid medications/creams/sprays. Ty! C

## 2021-01-14 NOTE — Telephone Encounter (Signed)
Called and spoke with the pharmacy and because pt was on Humalog 200u/ml they can order and have Lyumjev 200u/ml ordered and available by tomorrow for pt to pick up. Ok to send?

## 2021-01-14 NOTE — Telephone Encounter (Signed)
Rx sent to preferred pharmacy.

## 2021-01-14 NOTE — Telephone Encounter (Signed)
Called and spoke with pt's daughter scheduled follow up lab appt.

## 2021-01-14 NOTE — Addendum Note (Signed)
Addended by: Lauralyn Primes on: 01/14/2021 12:13 PM   Modules accepted: Orders

## 2021-01-14 NOTE — Telephone Encounter (Signed)
Yes, OK 

## 2021-01-15 DIAGNOSIS — Z992 Dependence on renal dialysis: Secondary | ICD-10-CM | POA: Diagnosis not present

## 2021-01-15 DIAGNOSIS — N2581 Secondary hyperparathyroidism of renal origin: Secondary | ICD-10-CM | POA: Diagnosis not present

## 2021-01-15 DIAGNOSIS — D631 Anemia in chronic kidney disease: Secondary | ICD-10-CM | POA: Diagnosis not present

## 2021-01-15 DIAGNOSIS — N186 End stage renal disease: Secondary | ICD-10-CM | POA: Diagnosis not present

## 2021-01-15 DIAGNOSIS — D509 Iron deficiency anemia, unspecified: Secondary | ICD-10-CM | POA: Diagnosis not present

## 2021-01-17 DIAGNOSIS — N2581 Secondary hyperparathyroidism of renal origin: Secondary | ICD-10-CM | POA: Diagnosis not present

## 2021-01-17 DIAGNOSIS — Z992 Dependence on renal dialysis: Secondary | ICD-10-CM | POA: Diagnosis not present

## 2021-01-17 DIAGNOSIS — D509 Iron deficiency anemia, unspecified: Secondary | ICD-10-CM | POA: Diagnosis not present

## 2021-01-17 DIAGNOSIS — D631 Anemia in chronic kidney disease: Secondary | ICD-10-CM | POA: Diagnosis not present

## 2021-01-17 DIAGNOSIS — N186 End stage renal disease: Secondary | ICD-10-CM | POA: Diagnosis not present

## 2021-01-20 DIAGNOSIS — D509 Iron deficiency anemia, unspecified: Secondary | ICD-10-CM | POA: Diagnosis not present

## 2021-01-20 DIAGNOSIS — N186 End stage renal disease: Secondary | ICD-10-CM | POA: Diagnosis not present

## 2021-01-20 DIAGNOSIS — D631 Anemia in chronic kidney disease: Secondary | ICD-10-CM | POA: Diagnosis not present

## 2021-01-20 DIAGNOSIS — Z992 Dependence on renal dialysis: Secondary | ICD-10-CM | POA: Diagnosis not present

## 2021-01-20 DIAGNOSIS — N2581 Secondary hyperparathyroidism of renal origin: Secondary | ICD-10-CM | POA: Diagnosis not present

## 2021-01-21 ENCOUNTER — Telehealth: Payer: Self-pay | Admitting: Internal Medicine

## 2021-01-21 ENCOUNTER — Other Ambulatory Visit: Payer: Self-pay

## 2021-01-21 ENCOUNTER — Other Ambulatory Visit: Payer: Self-pay | Admitting: Family Medicine

## 2021-01-21 ENCOUNTER — Telehealth: Payer: Self-pay

## 2021-01-21 ENCOUNTER — Other Ambulatory Visit: Payer: Medicare Other

## 2021-01-21 DIAGNOSIS — N186 End stage renal disease: Secondary | ICD-10-CM | POA: Diagnosis not present

## 2021-01-21 DIAGNOSIS — E8770 Fluid overload, unspecified: Secondary | ICD-10-CM | POA: Diagnosis not present

## 2021-01-21 DIAGNOSIS — Z992 Dependence on renal dialysis: Secondary | ICD-10-CM | POA: Diagnosis not present

## 2021-01-21 MED ORDER — FLUCONAZOLE 150 MG PO TABS: 150.0000 mg | ORAL_TABLET | Freq: Once | ORAL | 1 refills | Status: AC

## 2021-01-21 NOTE — Telephone Encounter (Signed)
No answer-- unable to leave message.

## 2021-01-21 NOTE — Telephone Encounter (Signed)
Patient called was spoke with Dr Moshe Cipro on 10.13.2022 and nothing was sent to her pharmacy for yeast infection or any other antibodies. Call back # [phone].

## 2021-01-21 NOTE — Telephone Encounter (Signed)
PT in office advise that diabetic supplies be sent to [facility] in [place]

## 2021-01-22 DIAGNOSIS — N186 End stage renal disease: Secondary | ICD-10-CM | POA: Diagnosis not present

## 2021-01-22 DIAGNOSIS — D509 Iron deficiency anemia, unspecified: Secondary | ICD-10-CM | POA: Diagnosis not present

## 2021-01-22 DIAGNOSIS — Z992 Dependence on renal dialysis: Secondary | ICD-10-CM | POA: Diagnosis not present

## 2021-01-22 DIAGNOSIS — N2581 Secondary hyperparathyroidism of renal origin: Secondary | ICD-10-CM | POA: Diagnosis not present

## 2021-01-22 DIAGNOSIS — D631 Anemia in chronic kidney disease: Secondary | ICD-10-CM | POA: Diagnosis not present

## 2021-01-22 NOTE — Telephone Encounter (Signed)
Second attempt to contact the patient, goes straight to voicemail. Unable to leave voicemail due to it being full.

## 2021-01-22 NOTE — Telephone Encounter (Signed)
Attempted to contact the patient to verify which diabetic supplies she is requesting to be sent in. Didn't receive an answer, mail box not yet set up.

## 2021-01-23 NOTE — Telephone Encounter (Signed)
No answer-- unable to leave message.

## 2021-01-24 DIAGNOSIS — N2581 Secondary hyperparathyroidism of renal origin: Secondary | ICD-10-CM | POA: Diagnosis not present

## 2021-01-24 DIAGNOSIS — D509 Iron deficiency anemia, unspecified: Secondary | ICD-10-CM | POA: Diagnosis not present

## 2021-01-24 DIAGNOSIS — N186 End stage renal disease: Secondary | ICD-10-CM | POA: Diagnosis not present

## 2021-01-24 DIAGNOSIS — Z992 Dependence on renal dialysis: Secondary | ICD-10-CM | POA: Diagnosis not present

## 2021-01-24 DIAGNOSIS — D631 Anemia in chronic kidney disease: Secondary | ICD-10-CM | POA: Diagnosis not present

## 2021-01-24 NOTE — Telephone Encounter (Signed)
No answer-- unable to leave message.

## 2021-01-27 DIAGNOSIS — N186 End stage renal disease: Secondary | ICD-10-CM | POA: Diagnosis not present

## 2021-01-27 DIAGNOSIS — D631 Anemia in chronic kidney disease: Secondary | ICD-10-CM | POA: Diagnosis not present

## 2021-01-27 DIAGNOSIS — Z992 Dependence on renal dialysis: Secondary | ICD-10-CM | POA: Diagnosis not present

## 2021-01-27 DIAGNOSIS — D509 Iron deficiency anemia, unspecified: Secondary | ICD-10-CM | POA: Diagnosis not present

## 2021-01-27 DIAGNOSIS — N2581 Secondary hyperparathyroidism of renal origin: Secondary | ICD-10-CM | POA: Diagnosis not present

## 2021-01-29 DIAGNOSIS — N186 End stage renal disease: Secondary | ICD-10-CM | POA: Diagnosis not present

## 2021-01-29 DIAGNOSIS — N2581 Secondary hyperparathyroidism of renal origin: Secondary | ICD-10-CM | POA: Diagnosis not present

## 2021-01-29 DIAGNOSIS — D509 Iron deficiency anemia, unspecified: Secondary | ICD-10-CM | POA: Diagnosis not present

## 2021-01-29 DIAGNOSIS — D631 Anemia in chronic kidney disease: Secondary | ICD-10-CM | POA: Diagnosis not present

## 2021-01-29 DIAGNOSIS — Z992 Dependence on renal dialysis: Secondary | ICD-10-CM | POA: Diagnosis not present

## 2021-01-31 DIAGNOSIS — D631 Anemia in chronic kidney disease: Secondary | ICD-10-CM | POA: Diagnosis not present

## 2021-01-31 DIAGNOSIS — D509 Iron deficiency anemia, unspecified: Secondary | ICD-10-CM | POA: Diagnosis not present

## 2021-01-31 DIAGNOSIS — N186 End stage renal disease: Secondary | ICD-10-CM | POA: Diagnosis not present

## 2021-01-31 DIAGNOSIS — Z992 Dependence on renal dialysis: Secondary | ICD-10-CM | POA: Diagnosis not present

## 2021-01-31 DIAGNOSIS — N2581 Secondary hyperparathyroidism of renal origin: Secondary | ICD-10-CM | POA: Diagnosis not present

## 2021-02-03 DIAGNOSIS — D631 Anemia in chronic kidney disease: Secondary | ICD-10-CM | POA: Diagnosis not present

## 2021-02-03 DIAGNOSIS — D509 Iron deficiency anemia, unspecified: Secondary | ICD-10-CM | POA: Diagnosis not present

## 2021-02-03 DIAGNOSIS — N2581 Secondary hyperparathyroidism of renal origin: Secondary | ICD-10-CM | POA: Diagnosis not present

## 2021-02-03 DIAGNOSIS — Z992 Dependence on renal dialysis: Secondary | ICD-10-CM | POA: Diagnosis not present

## 2021-02-03 DIAGNOSIS — N186 End stage renal disease: Secondary | ICD-10-CM | POA: Diagnosis not present

## 2021-02-05 DIAGNOSIS — N2581 Secondary hyperparathyroidism of renal origin: Secondary | ICD-10-CM | POA: Diagnosis not present

## 2021-02-05 DIAGNOSIS — Z992 Dependence on renal dialysis: Secondary | ICD-10-CM | POA: Diagnosis not present

## 2021-02-05 DIAGNOSIS — N186 End stage renal disease: Secondary | ICD-10-CM | POA: Diagnosis not present

## 2021-02-05 DIAGNOSIS — D631 Anemia in chronic kidney disease: Secondary | ICD-10-CM | POA: Diagnosis not present

## 2021-02-05 DIAGNOSIS — D509 Iron deficiency anemia, unspecified: Secondary | ICD-10-CM | POA: Diagnosis not present

## 2021-02-06 ENCOUNTER — Other Ambulatory Visit: Payer: Self-pay | Admitting: Orthopedic Surgery

## 2021-02-06 DIAGNOSIS — M545 Low back pain, unspecified: Secondary | ICD-10-CM

## 2021-02-07 DIAGNOSIS — D509 Iron deficiency anemia, unspecified: Secondary | ICD-10-CM | POA: Diagnosis not present

## 2021-02-07 DIAGNOSIS — N186 End stage renal disease: Secondary | ICD-10-CM | POA: Diagnosis not present

## 2021-02-07 DIAGNOSIS — N2581 Secondary hyperparathyroidism of renal origin: Secondary | ICD-10-CM | POA: Diagnosis not present

## 2021-02-07 DIAGNOSIS — D631 Anemia in chronic kidney disease: Secondary | ICD-10-CM | POA: Diagnosis not present

## 2021-02-07 DIAGNOSIS — Z992 Dependence on renal dialysis: Secondary | ICD-10-CM | POA: Diagnosis not present

## 2021-02-09 NOTE — Progress Notes (Signed)
Cardiology Office Note  Date: 02/10/2021   ID: Kathryn Beck, DOB 15-Dec-1955, MRN [id number]  PCP:  Fayrene Helper, MD  Cardiologist:  Rozann Lesches, MD Electrophysiologist:  None   Chief Complaint: Unstable blood pressure  History of Present Illness: Kathryn Beck is a 65 y.o. female with a history of malignant hypertension, chronic diastolic heart failure, ischemic heart disease, near syncope, sleep apnea, DM2, ESRD, HLD, obesity, pancreatitis.  She was last seen by Dr. Domenic Polite on 10/13/2019.  She had recently transitioned from peritoneal dialysis to hemodialysis.  She had a left arm AV fistula.  She was following with Dr. Theador Hawthorne nephrology.  She remained on transplant list at [facility].  Previous echocardiogram October 2020 demonstrated EF of 60 to 65% with moderate LVH, mild mitral and tricuspid regurgitation.  Previous cardiac catheterization demonstrated normal coronary arteries.  She was on multimodal antihypertensive therapy.  No current heart failure symptoms.  She was currently taking Norvasc, clonidine, Imdur, and Toprol-XL.  Was having some low blood pressure with hemodialysis and was using ProAmatine.  She continues on the renal transplant list at [facility].  She was continuing on Zetia for her hyperlipidemia.   She is here for complaints of fluctuating blood pressures.  Apparently the PA at the dialysis center was concerned about her blood pressure dropping during dialysis and being elevated after the fact.  She states she has been taking all of her a.m. medications prior to dialysis before arriving.  She states afterward her blood pressure is usually low and she sometimes passes out.  We discussed adjusting medications such as not taking her a.m. dose of torsemide 100 mg on dialysis days but taking the second dose in the evening.  On nondialysis days we discussed taking torsemide as it is currently prescribed which is 100 mg p.o. twice daily.  We also discussed not taking the  Toprol-XL 50 mg in a.m. during dialysis days but taking it in the p.m.  She is currently on other antihypertensive medications including amlodipine 5 mg p.o. twice daily.  She should take the p.m. dose only on dialysis days and take a.m. and p.m. doses on nondialysis days.  We discussed follow-up after medication changes.  Otherwise she denies any cardiac issues.     Past Medical History:  Diagnosis Date   Acid reflux    Anemia    Arthritis    Axillary masses    Soft tissue - status post excision   Back pain    COVID-19 virus infection 04/06/2019   Depression    End-stage renal disease ([place])    TTHSat  in [place] hypertension    History of blood transfusion    History of cardiac catheterization    Normal coronary arteries October 2020   History of claustrophobia    History of pneumonia 2019   Hypoxia 04/03/2019   Mixed hyperlipidemia    Obesity    Pancreatitis    Peritoneal dialysis catheter in place [facility])    Pneumonia due to COVID-19 virus 04/02/2019   Sleep apnea    Noncompliant with CPAP   Type 2 diabetes mellitus ([place])     Past Surgical History:  Procedure Laterality Date   ABDOMINAL HYSTERECTOMY     AV FISTULA PLACEMENT Left 09/02/2017   Procedure: creation of left arm ARTERIOVENOUS (AV) FISTULA;  Surgeon: Serafina Mitchell, MD;  Location: [place];  Service: Vascular;  Laterality: Left;   COLONOSCOPY  2008   Dr. Oneida Alar: normal  COLONOSCOPY N/A 12/18/2016   Dr. Oneida Alar: multiple tubular adenomas, internal hemorrhoids. Surveillance in 3 years    ESOPHAGEAL DILATION N/A 10/13/2015   Procedure: ESOPHAGEAL DILATION;  Surgeon: Rogene Houston, MD;  [facility];  Service: Endoscopy;  Laterality: N/A;   ESOPHAGOGASTRODUODENOSCOPY N/A 10/13/2015   Dr. Laural Golden: chronic gastritis on path, no H.pylori. Empiric dilation    ESOPHAGOGASTRODUODENOSCOPY N/A 12/18/2016   Dr. Oneida Alar: mild gastritis. BRAVO study revealed uncontrolled GERD. Dysphagia secondary to  uncontrolled reflux   FOOT SURGERY Bilateral    "nerve"     LEFT HEART CATH AND CORONARY ANGIOGRAPHY N/A 12/29/2018   Procedure: LEFT HEART CATH AND CORONARY ANGIOGRAPHY;  Surgeon: Jettie Booze, MD;  Location: [place] [facility];  Service: Cardiovascular;  Laterality: N/A;   LUNG BIOPSY     MASS EXCISION Right 01/09/2013   Procedure: EXCISION OF NEOPLASM OF RIGHT  AXILLA  AND EXCISION OF NEOPLASM OF LEFT AXILLA;  Surgeon: Jamesetta So, MD;  Location: [facility];  Service: General;  Laterality: Right;  procedure end @ 08:23   MYRINGOTOMY WITH TUBE PLACEMENT Bilateral 04/28/2017   Procedure: BILATERAL MYRINGOTOMY WITH TUBE PLACEMENT;  Surgeon: Leta Baptist, MD;  Location: [place];  Service: ENT;  Laterality: Bilateral;   REVISION OF ARTERIOVENOUS GORETEX GRAFT Left 05/04/2018   Procedure: TRANSPOSITION OF CEPHALIC VEIN ARTERIOVENOUS FISTULA LEFT ARM;  Surgeon: Rosetta Posner, MD;  Location: [place];  Service: Vascular;  Laterality: Left;   SAVORY DILATION N/A 12/18/2016   Procedure: SAVORY DILATION;  Surgeon: Danie Binder, MD;  [facility];  Service: Endoscopy;  Laterality: N/A;    Current Outpatient Medications  Medication Sig Dispense Refill   amLODipine (NORVASC) 5 MG tablet Take one tablet by mouth two times daily by mouth 60 tablet 5   aspirin 81 MG chewable tablet CHEW 2 TABLETS BY MOUTH ONCE DAILY 56 tablet 11   B Complex-C-Folic Acid (RENA-VITE RX) 1 MG TABS Take 1 tablet by mouth daily.     calcitRIOL (ROCALTROL) 0.25 MCG capsule Take 0.25 capsules by mouth.     Chlorpheniramine-DM (CORICIDIN COUGH/COLD) 4-30 MG TABS Take 1 tablet by mouth every 6 (six) hours as needed. 28 tablet 0   cloNIDine (CATAPRES) 0.2 MG tablet Take 0.2 mg by mouth 2 (two) times daily.     Continuous Blood Gluc Sensor (FREESTYLE LIBRE 2 SENSOR) MISC 1 each by Does not apply route every 14 (fourteen) days. 6 each 3   cyclobenzaprine (FLEXERIL) 5 MG tablet TAKE ONE TABLET BY MOUTH AT BEDTIME AS NEEDED FOR  MUSCLE SPASMS. 30 tablet 0   DULoxetine (CYMBALTA) 60 MG capsule TAKE (1) CAPSULE BY MOUTH ONCE DAILY. 28 capsule 11   ezetimibe (ZETIA) 10 MG tablet TAKE 1 TABLET BY MOUTH ONCE DAILY. 28 tablet 11   HYDROcodone-acetaminophen (NORCO/VICODIN) 5-325 MG tablet Take 1 tablet by mouth every 6 (six) hours as needed for moderate pain. 30 tablet 0   Insulin Glargine, 2 Unit Dial, (TOUJEO MAX SOLOSTAR) 300 UNIT/ML SOPN Inject 50-56 Units into the skin See admin instructions. Take after dialysis 3 day a week. 3 pen 11   Insulin Lispro-aabc (LYUMJEV KWIKPEN) 200 UNIT/ML KwikPen Inject 24-28 Units into the skin 3 (three) times daily before meals. 36 mL 1   irbesartan (AVAPRO) 150 MG tablet SMARTSIG:1 Tablet(s) By Mouth Every Evening     isosorbide mononitrate (IMDUR) 30 MG 24 hr tablet TAKE 1 TABLET BY MOUTH ONCE DAILY. 28 tablet 11   levalbuterol (XOPENEX)  1.25 MG/0.5ML nebulizer solution Take 1.25 mg by nebulization every 4 (four) hours as needed for wheezing or shortness of breath. 1 each 0   Melatonin 3 MG TBDP Take one tablet by mouth at bedtime , for sleep 30 tablet 2   metoprolol succinate (TOPROL-XL) 50 MG 24 hr tablet TAKE (1) TABLET BY MOUTH DAILY WITH FOOD 28 tablet 11   midodrine (PROAMATINE) 10 MG tablet TAKE 1 TABLET BY MOUTH TWICE DAILY 56 tablet 5   ondansetron (ZOFRAN) 4 MG tablet Take 1 tablet (4 mg total) by mouth every 8 (eight) hours as needed for nausea or vomiting. 20 tablet 0   phenol (CHLORASEPTIC) 1.4 % LIQD Use as directed 1 spray in the mouth or throat as needed for throat irritation / pain. 177 mL 0   pregabalin (LYRICA) 50 MG capsule TAKE (1) CAPSULE BY MOUTH THREE TIMES DAILY 84 capsule 0   sertraline (ZOLOFT) 100 MG tablet TAKE 1 TABLET BY MOUTH EVERY DAY 90 tablet 2   torsemide (DEMADEX) 100 MG tablet TAKE 1 TABLET BY MOUTH TWICE DAILY 56 tablet 11   triamcinolone cream (KENALOG) 0.1 % APPLY TO AFFECTED AREA TWICE A DAY 30 g 0   ULTICARE MINI PEN NEEDLES 31G X 6 MM MISC USE AS  DIRECTED 100 each 0   [name] 500 MG chewable tablet Chew 500 mg by mouth See admin instructions. Take 500 mg with each snack and meal     zolpidem (AMBIEN) 5 MG tablet TAKE 1 TABLET BY MOUTH AT BEDTIME AS NEEDED. 30 tablet 0   No current facility-administered medications for this visit.   Facility-Administered Medications Ordered in Other Visits  Medication Dose Route Frequency Provider Last Rate Last Admin   0.9 %  sodium chloride infusion   Intravenous Continuous Lockamy, Randi L, NP-C 20 mL/hr at 07/19/19 1641 Rate Verify at 07/19/19 1641   Allergies:  Ace inhibitors, Penicillins, Statins, and Albuterol   Social History: The patient  reports that she has never smoked. She has never used smokeless tobacco. She reports that she does not drink alcohol and does not use drugs.   Family History: The patient's family history includes Arthritis in her father; Breast cancer in her sister; Hypercholesterolemia in her father and sister; Hypertension in her father, sister, and sister.   ROS:  Please see the history of present illness. Otherwise, complete review of systems is positive for none.  All other systems are reviewed and negative.   Physical Exam: VS:  BP (!) 164/82   Pulse 78   Ht 5\' 4"  (1.626 m)   Wt 213 lb 6.4 oz (96.8 kg)   SpO2 96%   BMI 36.63 kg/m , BMI Body mass index is 36.63 kg/m.  Wt Readings from Last 3 Encounters:  02/10/21 213 lb 6.4 oz (96.8 kg)  01/02/21 207 lb 0.6 oz (93.9 kg)  12/02/20 198 lb (89.8 kg)    General: Patient appears comfortable at rest. Neck: Supple, no elevated JVP or carotid bruits, no thyromegaly. Lungs: Clear to auscultation, nonlabored breathing at rest. Cardiac: Regular rate and rhythm, no S3 or significant systolic murmur, no pericardial rub. Extremities: No pitting edema, distal pulses 2+. Skin: Warm and dry. Musculoskeletal: No kyphosis. Neuropsychiatric: Alert and oriented x3, affect grossly appropriate.  ECG:    Recent  Labwork: 11/05/2020: Magnesium 2.2 12/02/2020: ALT 37; AST 13; BUN 86; Creatinine, Ser 10.13; Hemoglobin 10.6; Platelets 257; Potassium 4.6; Sodium 138     Component Value Date/Time   CHOL 190 11/21/2020  1402   TRIG 259 (H) 12/02/2020 1754   HDL 43.10 11/21/2020 1402   CHOLHDL 4 11/21/2020 1402   VLDL 54.4 (H) 11/21/2020 1402   LDLCALC 114 (H) 03/08/2018 1234   LDLCALC 109 (H) 11/12/2017 0936   LDLDIRECT 120.0 11/21/2020 1402    Other Studies Reviewed Today:  CPX 11/01/2020 [place] ---------------------------------------------------------------     Conclusions:    1. The data suggests a maximal exercise test, so maximal cardiorespiratory    capacity could be determined.     2. Based on the peak oxygen consumption (VO2) achieved, functional capacity    would be consistent with a moderate functional impairment, with peak VO2    8.3 ml/kg/min (53% of predicted normals). Normative peak predicted VO2    values are corrected for age, gender, and weight.     3. There were maximal signs of a circulatory limitation to exercise with    blunted stroke volume augmentation with exercise.    - The O2 pulse (an indicator of stroke volume) reached a plateau at an    approximate HR of 100 bpm.    - Oxygen uptake efficiency slope was 1054 mL/min (58% predicted)    - Severe systolic hypertension throughout study    - ECG was NSR at rest and showed no ST segment changes or ectopy.     4. Aerobic reserve was low due to physical deconditioning limiting exercise    capacity.     5. Ventilatory reserve limits were not approached at peak exercise. Resting    spirometry suggest a restrictive pulmonary physiology. The MVV is normal.     6. No prior CPET available for comparison.     This is the metabolic report for the Cardiopulmonary Exercise Test. The    Stress ECG report is available on a separate report.        Echocardiogram [facility] 11/01/2020 [facility]            Meah, Jiron                                                       [id number]  DOB: 1955/03/24                 CARDIAC DIAGNOSTIC UNIT               Date: 11/01/2020 14:00:00                                                       Adult     Female Age: 20                   ECHO-DOPPLER REPORT                 Outpatient                                                       MPDC  ---------------------------------------------------- MD1: Wynelle Beckmann  Ja      STUDY: Chest Wall          TAPE: 0000:00:0:00:00 BP: 215/85       ECHO: Yes   DOPPLER: Yes  FILE: [id number]:   HR: 85      COLOR: Yes  CONTRAST: No      MACHINE: [id number] #[id number]: 64 in  RV BIOPSY: No         3D: Yes  SOUND QLTY: Moderate  Weight: 198 lbs     MEDIUM: None                                      BSA: 2.01,  BMI: 34.00  ------------------------------------------------------------------------------     HISTORY: pre- Surgery      REASON: Assess LV function Assess RV function  INDICATION: Z01.810 - Encounter for preprocedural cardiovascular examination.    ECHOCARDIOGRAPHIC MEASUREMENTS -----------------------------------------------  2D DIMENSIONS  AORTA          Values     Normal RangeMAIN PA      Values     Normal Range       Annulus:  nm*  cm    [1.9 - 2.7]    PA Main:  nm*  cm    [1.5 - 2.1]     Aorta Sin:   2.7 cm    [2.4 - 3.6] RIGHT VENTRICLE   ST Junction:  nm*  cm    [2 - 3.2]      RV Base:   3.8 cm    [2.5 - 4.1]     Asc.Aorta:   3.4 cm    [1.9 - 3.5]     RV Mid:  nm*  cm    [1.9 - 3.5]  LEFT VENTRICLE                         RV Length:  nm*  cm    [  ]         LVIDd:   5.2 cm    [3.7 - 5.3] RIGHT ATRIUM         LVIDs:   3.5 cm    [2.2 - 3.4]    RA Area:  18   cm2   [ <= 20]        LVEDVi:  65.0 ml/m2 [29 - 61]         RAVi:  25   ml/m2 [15 - 27]        LVESVi:  24.0 ml/m2 [8 - 24]    INFERIOR VENA CAVA            FS:  33   %     [ >= 25]        Max.IVC:  nm*  cm    [ <= 2.1]           SWT:   1.3 cm    [0.6 - 0.9]    Min.IVC:  nm*  cm    [ <= 1.7]           PWT:   1.3 cm    [0.6 - 0.9] __________________  LEFT ATRIUM                           nm* - not measured       LA Diam:  4.9 cm    [2.7 - 3.8]       LA Area:  27   cm2   [ <= 20]     LA Volume:  95   ml    [22 - 52]          LAVi:  47   ml/m2 [16 - 34]   ECHOCARDIOGRAPHIC DESCRIPTIONS -----------------------------------------------  AORTIC ROOT          Size: Normal    Dissection: INDETERM FOR DISSECTION   AORTIC VALVE      Leaflets: Tricuspid             Morphology: MILDLY THICKENED      Mobility: Fully Mobile   LEFT VENTRICLE                                      Anterior: Normal          Size: Normal                                 Lateral: Normal   Contraction: Normal                                  Septal: Normal    Closest EF: >55%(Estimated)  Calc.EF: 62% (3D)      Apical: Normal     LV masses: No Masses                             Inferior: Normal           LVH: MILD LVH CONCENTRIC                  Posterior: Normal   LV GLS(GE): -17.6% Normal Range [ <= -16]  Dias.FxClass: RELAXATION ABNORMALITY (GRADE 2) CORRESPONDS TO PSEUDONORMAL   MITRAL VALVE      Leaflets: Normal                  Mobility: Fully mobile    Morphology: Normal   LEFT ATRIUM          Size: MODERATELY ENLARGED     LA masses: No masses                Normal IAS   MAIN PA          Size: Not seen   PULMONIC VALVE    Morphology: Normal      Mobility: Fully Mobile   RIGHT VENTRICLE          Size: Normal                    Free wall: Normal   Contraction: Normal                    RV masses: No Masses         TAPSE:   2.7 cm,  Normal Range [>= 1.6 cm]   TRICUSPID VALVE      Leaflets: Normal                  Mobility: Fully mobile    Morphology: Normal   RIGHT ATRIUM          Size: Normal  RA Other: None     RA masses: No masses   PERICARDIUM         Fluid:  No effusion   INFERIOR VENACAVA          Size: Normal     Normal respiratory collapse   DOPPLER ECHO and OTHER SPECIAL PROCEDURES ------------------------------------     Aortic: TRIVIAL AR             No AS      Mitral: TRIVIAL MR             No MS     MV Inflow E Vel.= 98.0 cm/s  MV Annulus E'Vel.= 7.0 cm/s  E/E'Ratio= 14   Tricuspid: TRIVIAL TR             No TS   Pulmonary: No PR                  No PS       Other:   INTERPRETATION ---------------------------------------------------------------    NORMAL LEFT VENTRICULAR SYSTOLIC FUNCTION WITH MILD LVH    ELEVATED LA PRESSURES WITH DIASTOLIC DYSFUNCTION    NORMAL RIGHT VENTRICULAR SYSTOLIC FUNCTION    VALVULAR REGURGITATION: TRIVIAL AR, TRIVIAL MR, TRIVIAL TR    NO VALVULAR STENOSIS    AORTIC SCLEROSIS    NO PRIOR STUDY FOR COMPARISON    3D acquisition and reconstructions were performed as part of this    examination to more accurately quantify the effects of identified    structural abnormalities as part of the exam. (post-processing on an    Independent workstation).    PET myocardial perfusion rest and stress [facility] 02/09/2020  Myocardial Perfusion   Report     KYESHIA, ZINN Exam Date: 02/09/2020 12:06 Ordering Phys: Blair Heys   MRN: [id number] Gender: F Exam Location: [facility] PET Referring Phys: Blair Heys   Age: 42 DOB: 1955-07-07 Ht (cm): 163 Wt (kg): 83 BMI: 31.24 Technologist: Erskine Squibb, CNMT   Procedure CPT: [id number] Cardiac PET WithConcurrent CT    History and Risk Factors:   Patient History: History of HTN, DM, obesity, OSA, ESRD on dialysis, being evaluated for renal transplant.   Cardiac History: Hypertension. Diabetes.    Procedure Date   Cardiac echo 12/2018    Cardiac Meds: Labetalol, Imdur, Amlodipine, Olmesartan, Lasix, HCTZ, Lipitor, Zetia, ASA, Clonidine   Pretest Chest Pain: No symptoms   Hours NPO: >4 Hours without caffeine: >12   Comparison to prior  studies:     No Prior Studies For Comparison.     Image Analysis:     Rest Stress Conclusion   Basal Anterior Normal Normal Normal   Basal Ant Septal Normal Normal Normal   Basal Inf Septal Normal Normal Normal   Basal Inferior Normal Normal Normal   Basal Inf Lat Normal Normal Normal   Basal Ant Lat Normal Normal Normal   Mid Anterior Normal Normal Normal   Mid Ant Septal Normal Normal Normal   Mid Inf Septal Normal Normal Normal   Mid Inferior Normal Normal Normal   Mid Inf Lateral Normal Normal Normal   Mid Ant Lateral Normal Normal Normal   Apical Anterior Normal Normal Normal   Apical Septal Normal Normal Normal   Apical Inferior Normal Normal Normal   Apical Lateral Normal Normal Normal   Apex Normal Normal Normal   SPECT RESULTS   Technical Quality: Excellent   Raw Data Analysis: Normal    PERFUSION: Cardiac PET/CT Myocardial  Perfusion Imaging Study Demonstrate No Evidence of Ischemia or   Infarct.    STRESS TEST Pharmacologic     Protocol: Regadenoson Dose: 0.4 mg   Stage Dose Time (min) BP (mmHg) HR (bpm) Comments   Resting   140/62 68     Stage 1 0.4 mg 1:00  75     Recovery 1  1:00  81     Recovery 2  1:00 120/54 78     Recovery 3  2:00  79     Recovery 4  3:00 118/60 74      Resting HR (bpm): 68 Resting BP (mmHg): 140 / 62 MaxPHR: 157 Target HR (bpm): 133   Peak HR (bpm): 81 Peak BP (mmHg): 120 / 54 % MaxPHR: 52 Double Product: 2595   BP Response: Normal blood pressure response during stress   Stress Termination: Planned / protocol completed   Stress Symptoms: None     ECG Negative   ECG Interpretation: Heart rate is N/A    IMAGE PROTOCOL Rest/Stress 1 Day Modality: PET- Discovery MI   Radiopharmaceutical Dose (mCi) Imaging  Date Inj Time Img Time Rescan Reason Rescan Time   Rest: RB-82 Rubidium, IV 29.9 02/09/2020 1336 1336   Stress: RB-82 Rubidium, IV 29.9 02/09/2020 1349 1349   Rest Administration Site: Right Forearm Tech Administering Rest  Dose: Erskine Squibb, CNMT   Stress Administration Site: Right Forearm Tech Administering Stress Dose: Erskine Squibb, CNMT    FUNCTIONAL RESULTS (calculated via Gated SPECT)   Post Stress Image LV EF: 60 %   Stress EDV: 103 ml EDVI: 52 ml/m TID:   Stress ESV: 42 ml ESVI: 21 ml/m   Rest Image LV EF: 59 %   Rest Image LV EF:   Rest EDV: 96 ml EDVI: 49 ml/m   Rest ESV: 39 ml ESVI: 20 ml/m          Wall Motion   Wall Thickening   Anterior: Normal Normal   Apex: Normal Normal   Inferior: Normal Normal   Lateral: Normal Normal   Septal: Normal Normal    LV Ejection Fraction  & Size: Gated Cardiac PET/CT Functional Study Demonstrate Normal Left Ventricular Ejection   Fraction at Rest and Stress as Well as Normal Left Ventricular Volumes.     LV Function & Wall Thickening: Gated Cardiac PET/CT Functional Study Demonstrate Normal Left Ventricular Function   and Wall Thickening.   Right Ventriclar Analysis: No Evidence of Increased Tracer Uptake in The Right Ventricular Free Wall.     FINAL COMMENTS   1- Cardiac PET/CT Myocardial Perfusion Imaging Study Demonstrate No Evidence of Ischemia or Infarct.   2- Gated Cardiac PET/CT Functional Study Demonstrate Normal Left Ventricular Ejection Fraction at Rest and Stress as Well as   Normal Left Ventricular Volumes.   3- Gated Cardiac PET/CT Functional Study Demonstrate Normal Left Ventricular Function and Wall Thickening.   4- No Evidence of Increased Tracer Uptake in The Right Ventricular Free Wall.    Echocardiogram 01/17/2019:  1. Left ventricular ejection fraction, by visual estimation, is 60 to  65%. The left ventricle has normal function. There is moderately increased  left ventricular hypertrophy.   2. Left ventricular diastolic Doppler parameters are consistent with  impaired relaxation pattern of LV diastolic filling.   3. Global right ventricle has normal systolic function.The right  ventricular size is normal. No increase in  right ventricular wall  thickness.   4. Left atrial size was moderately dilated.  5. Right atrial size was normal.   6. The mitral valve is grossly normal. Mild mitral valve regurgitation.   7. The tricuspid valve is grossly normal. Tricuspid valve regurgitation  is mild.   8. The aortic valve is tricuspid Aortic valve regurgitation was not  visualized by color flow Doppler.   9. The pulmonic valve was grossly normal. Pulmonic valve regurgitation is  not visualized by color flow Doppler.  10. The inferior vena cava is dilated in size with >50% respiratory  variability, suggesting right atrial pressure of 8 mmHg.    Cardiac catheterization 12/29/2018: Large, widely patent coronary arteries. LV end diastolic pressure is mildly elevated. There is no aortic valve stenosis.   No significant CAD.  Continue medical therapy.  Assessment and Plan:  1. Essential hypertension   2. Mixed hyperlipidemia   3. ESRD on dialysis ([place])    1. Essential hypertension States she has been having some fluctuating blood pressures with significant decreases in blood pressure after dialysis.  States she has passed out on a couple of occasions because her blood pressure was so low.  She has been taking her a.m. blood pressure medication prior to dialysis.  We discussed changing torsemide to 100 mg in the evening on dialysis days and continuing torsemide 100 mg p.o. twice daily on nondialysis days.  We discussed not taking the Toprol-XL 50 mg in a.m. but taking it in the p.m. after dialysis.  We discussed not taking the 80 a.m. dose of amlodipine prior to dialysis but taking the p.m. dose later in the evening.  We discussed continuing clonidine 0.2 mg p.o. twice daily, continuing irbesartan 150 mg p.o. daily in the evening.  Continue midodrine 10 mg p.o. twice daily for low blood pressures.    2. Mixed hyperlipidemia Continue Zetia 10 mg p.o. daily.  Lipid panel on 11/21/2020: TC 190, HDL 43, direct LDL 120, TG 272,  VLDL 54.  She is intolerant to statins  3. ESRD on dialysis [facility]) Currently on dialysis Monday, Tuesdays and Fridays and for the most part she tolerates dialysis well.  She states she is compliant with her fluid restrictions.  Medication Adjustments/Labs and Tests Ordered: Current medicines are reviewed at length with the patient today.  Concerns regarding medicines are outlined above.   Disposition: Follow-up with Dr. Domenic Polite or APP 1 to 2 months  Signed, Levell July, NP 02/10/2021 8:46 AM    [facility] [place], [place], [place] [address] Phone: [phone]; Fax: [phone]

## 2021-02-10 ENCOUNTER — Encounter: Payer: Self-pay | Admitting: Family Medicine

## 2021-02-10 ENCOUNTER — Ambulatory Visit (INDEPENDENT_AMBULATORY_CARE_PROVIDER_SITE_OTHER): Payer: Medicare Other | Admitting: Family Medicine

## 2021-02-10 ENCOUNTER — Telehealth: Payer: Self-pay

## 2021-02-10 VITALS — BP 164/82 | HR 78 | Ht 64.0 in | Wt 213.4 lb

## 2021-02-10 DIAGNOSIS — I10 Essential (primary) hypertension: Secondary | ICD-10-CM | POA: Diagnosis not present

## 2021-02-10 DIAGNOSIS — Z992 Dependence on renal dialysis: Secondary | ICD-10-CM | POA: Diagnosis not present

## 2021-02-10 DIAGNOSIS — E782 Mixed hyperlipidemia: Secondary | ICD-10-CM

## 2021-02-10 DIAGNOSIS — D631 Anemia in chronic kidney disease: Secondary | ICD-10-CM | POA: Diagnosis not present

## 2021-02-10 DIAGNOSIS — N186 End stage renal disease: Secondary | ICD-10-CM

## 2021-02-10 DIAGNOSIS — N2581 Secondary hyperparathyroidism of renal origin: Secondary | ICD-10-CM | POA: Diagnosis not present

## 2021-02-10 DIAGNOSIS — D509 Iron deficiency anemia, unspecified: Secondary | ICD-10-CM | POA: Diagnosis not present

## 2021-02-10 MED ORDER — TORSEMIDE 100 MG PO TABS: 100.0000 mg | ORAL_TABLET | Freq: Two times a day (BID) | ORAL | Status: DC

## 2021-02-10 MED ORDER — METOPROLOL SUCCINATE ER 50 MG PO TB24
ORAL_TABLET | ORAL | Status: DC
Start: 2021-02-10 — End: 2021-03-05

## 2021-02-10 MED ORDER — AMLODIPINE BESYLATE 5 MG PO TABS: ORAL_TABLET | ORAL | Status: DC

## 2021-02-10 NOTE — Patient Instructions (Addendum)
Medication Instructions:  Hold the morning dose of your Torsemide on dialysis days (Monday, Wednesday, Friday) - may take the evening dose when you get home. On all other days, take your Torsemide as prescribed - twice a day. Hold your Toprol until after dialysis on Monday, Wednesday, Friday. On all other days, take at your regular schedule - daily. Hold the morning dose of Amlodipine on dialysis days (Monday, Wednesday, Friday) - may take the evening dose when you get home. On all other days take at your regular schedule - twice a day.  Continue all other medications.     Labwork: none  Testing/Procedures: none  Follow-Up: 1-2 months   Any Other Special Instructions Will Be Listed Below (If Applicable).   If you need a refill on your cardiac medications before your next appointment, please call your pharmacy.

## 2021-02-10 NOTE — Telephone Encounter (Signed)
Verbal order given  

## 2021-02-10 NOTE — Telephone Encounter (Signed)
Kathryn Beck called from  [facility]. Needs a verbal order for an extension order for occupational therapy and frequency of 1 week 1 , 2 week 3.  Call back # [phone]

## 2021-02-11 ENCOUNTER — Telehealth: Payer: Self-pay

## 2021-02-11 NOTE — Telephone Encounter (Signed)
Randall Hiss called from [facility] needs plan of care 485 sent back, please contact Eric at [phone].

## 2021-02-12 ENCOUNTER — Telehealth: Payer: Self-pay | Admitting: Family Medicine

## 2021-02-12 DIAGNOSIS — N186 End stage renal disease: Secondary | ICD-10-CM | POA: Diagnosis not present

## 2021-02-12 DIAGNOSIS — D509 Iron deficiency anemia, unspecified: Secondary | ICD-10-CM | POA: Diagnosis not present

## 2021-02-12 DIAGNOSIS — N2581 Secondary hyperparathyroidism of renal origin: Secondary | ICD-10-CM | POA: Diagnosis not present

## 2021-02-12 DIAGNOSIS — D631 Anemia in chronic kidney disease: Secondary | ICD-10-CM | POA: Diagnosis not present

## 2021-02-12 DIAGNOSIS — Z992 Dependence on renal dialysis: Secondary | ICD-10-CM | POA: Diagnosis not present

## 2021-02-12 NOTE — Telephone Encounter (Signed)
Called in verbal orders

## 2021-02-12 NOTE — Telephone Encounter (Signed)
Margaretha Seeds PT w/ [facility]   Called in on pt behalf for verbal orders to continue PT with pt   2 week 4   Call back info   Margaretha Seeds  [phone]

## 2021-02-14 DIAGNOSIS — Z992 Dependence on renal dialysis: Secondary | ICD-10-CM | POA: Diagnosis not present

## 2021-02-14 DIAGNOSIS — N2581 Secondary hyperparathyroidism of renal origin: Secondary | ICD-10-CM | POA: Diagnosis not present

## 2021-02-14 DIAGNOSIS — N186 End stage renal disease: Secondary | ICD-10-CM | POA: Diagnosis not present

## 2021-02-14 DIAGNOSIS — D509 Iron deficiency anemia, unspecified: Secondary | ICD-10-CM | POA: Diagnosis not present

## 2021-02-14 DIAGNOSIS — D631 Anemia in chronic kidney disease: Secondary | ICD-10-CM | POA: Diagnosis not present

## 2021-02-17 DIAGNOSIS — Z992 Dependence on renal dialysis: Secondary | ICD-10-CM | POA: Diagnosis not present

## 2021-02-17 DIAGNOSIS — N186 End stage renal disease: Secondary | ICD-10-CM | POA: Diagnosis not present

## 2021-02-17 DIAGNOSIS — D631 Anemia in chronic kidney disease: Secondary | ICD-10-CM | POA: Diagnosis not present

## 2021-02-17 DIAGNOSIS — D509 Iron deficiency anemia, unspecified: Secondary | ICD-10-CM | POA: Diagnosis not present

## 2021-02-17 DIAGNOSIS — N2581 Secondary hyperparathyroidism of renal origin: Secondary | ICD-10-CM | POA: Diagnosis not present

## 2021-02-18 ENCOUNTER — Telehealth: Payer: Self-pay

## 2021-02-18 ENCOUNTER — Other Ambulatory Visit: Payer: Self-pay | Admitting: Internal Medicine

## 2021-02-18 DIAGNOSIS — E1122 Type 2 diabetes mellitus with diabetic chronic kidney disease: Secondary | ICD-10-CM

## 2021-02-18 MED ORDER — NOVOLOG FLEXPEN 100 UNIT/ML ~~LOC~~ SOPN: 24.0000 [IU] | PEN_INJECTOR | Freq: Three times a day (TID) | SUBCUTANEOUS | 3 refills | Status: DC

## 2021-02-18 NOTE — Telephone Encounter (Signed)
Called and spoke to the pharmacy again and was advised by a Brad that the only insulin covered is Novolog U100. Please advise new directions.

## 2021-02-18 NOTE — Telephone Encounter (Signed)
T, I sent the Novolog instead of Lyumjev.  However, is he referring also to long-acting insulin?  Can she not get the Toujeo?

## 2021-02-18 NOTE — Telephone Encounter (Signed)
Mrs. Anstey daughter called to report she has no insulin on hand at home.   Thank you Call back phone [phone].

## 2021-02-19 DIAGNOSIS — Z992 Dependence on renal dialysis: Secondary | ICD-10-CM | POA: Diagnosis not present

## 2021-02-19 DIAGNOSIS — D631 Anemia in chronic kidney disease: Secondary | ICD-10-CM | POA: Diagnosis not present

## 2021-02-19 DIAGNOSIS — N2581 Secondary hyperparathyroidism of renal origin: Secondary | ICD-10-CM | POA: Diagnosis not present

## 2021-02-19 DIAGNOSIS — N186 End stage renal disease: Secondary | ICD-10-CM | POA: Diagnosis not present

## 2021-02-19 DIAGNOSIS — D509 Iron deficiency anemia, unspecified: Secondary | ICD-10-CM | POA: Diagnosis not present

## 2021-02-20 ENCOUNTER — Encounter: Payer: Self-pay | Admitting: Internal Medicine

## 2021-02-20 MED ORDER — NOVOLOG 70/30 FLEXPEN RELION (70-30) 100 UNIT/ML ~~LOC~~ SUPN: 24.0000 [IU] | PEN_INJECTOR | Freq: Three times a day (TID) | SUBCUTANEOUS | 5 refills | Status: DC

## 2021-02-20 NOTE — Telephone Encounter (Signed)
[name] requests to be called at ph# [phone] re: Patient's RX for Novolog has not been received by [facility] in [place]

## 2021-02-20 NOTE — Telephone Encounter (Signed)
Rx changed to preferred pharmacy.

## 2021-02-21 ENCOUNTER — Ambulatory Visit: Payer: 59 | Admitting: Internal Medicine

## 2021-02-21 ENCOUNTER — Other Ambulatory Visit: Payer: Self-pay

## 2021-02-21 DIAGNOSIS — Z992 Dependence on renal dialysis: Secondary | ICD-10-CM | POA: Diagnosis not present

## 2021-02-21 DIAGNOSIS — D509 Iron deficiency anemia, unspecified: Secondary | ICD-10-CM | POA: Diagnosis not present

## 2021-02-21 DIAGNOSIS — N186 End stage renal disease: Secondary | ICD-10-CM | POA: Diagnosis not present

## 2021-02-21 DIAGNOSIS — D631 Anemia in chronic kidney disease: Secondary | ICD-10-CM | POA: Diagnosis not present

## 2021-02-21 DIAGNOSIS — N2581 Secondary hyperparathyroidism of renal origin: Secondary | ICD-10-CM | POA: Diagnosis not present

## 2021-02-21 NOTE — Progress Notes (Deleted)
Patient ID: Kathryn Beck, female   DOB: [birth year], 65 y.o.   MRN: [id number]  This visit occurred during the SARS-CoV-2 public health emergency.  Safety protocols were in place, including screening questions prior to the visit, additional usage of staff PPE, and extensive cleaning of exam room while observing appropriate contact time as indicated for disinfecting solutions.   HPI: Kathryn Beck is a 65 y.o.-year-old female, initially referred by her PCP, Dr. Moshe Cipro, returning for follow-up for of DM2, dx in 2008, insulin-dependent since ~2015, uncontrolled, with complications (ESRD, PN). She saw Dr. Dorris Fetch - last OV with him 05/2016. She is here with her daughter who offers part of the history especially related to medication doses, blood sugars, and other medical history. Last OV with me 3 mo ago.  Interim history: She is on hemodialysis.  Awaiting kidney transplant. No increased urination, blurry vision, nausea, chest pain.  She continues to have some dizziness. She was seen in the emergency room with pneumonia 12/02/2020.  Adrenal masses.  Reviewed previous imaging reports: 04/22/2018: MRI abd.: Stable 19 mm nodule in the left adrenal gland, unchanged since 2015, 19 mm otherwise negative.  05/28/2017: MRI abd: Bilateral adrenal nodules with signal dropout on out of phase imaging, consistent with adenomas.   Example at 2.5 cm on the left and on the order of 1.6 cm on the right.  10/10/2015: CT abdomen and pelvis without contrast: Stable 2.2 cm left adrenal nodule with Hounsfield unit measurements 28 unchanged likely a lipid poor adenoma  08/23/2008: MRI of the abdomen with and without contrast: Left adrenal mass measuring 1.6 x 1.5 cm (previously measuring 1.4 x 1.6 cm on the CT scan from 2003 -stable in size),  Of note, she has an enlarging pancreatic head mass - pt reports a h/o pancreatitis. (This will need to be eval. By GI)  Reviewed previous adrenal biochemical  investigation:  Dexamethasone suppression test showed a low normal, nonsuppressed cortisol, but I suspect that this is due to her end-stage renal disease and a deficiency in cortisol clearance, rather than decreased production. Component     Latest Ref Rng & Units 08/31/2018  Cortisol - AM     mcg/dL 6.1  Dexamethasone, Serum     ng/dL 446  A 24-hour urine cortisol could not be done as she is on dialysis  After last visit, we checked a salivary cortisol and this was elevated 2 mo ago:    She took biotin in the day of the collection so I ordered repeat salivary cortisol stay the multivitamins with biotin for several days.  She did not have this checked yet.  Previous investigation was negative for hyperaldosteronism or pheochromocytoma: Component     Latest Ref Rng & Units 11/21/2020          Epinephrine     pg/mL <20  Norepinephrine     pg/mL 332  Dopamine     pg/mL <10  Total Catecholamines     pg/mL 332  Metanephrine, Pl     <=57 pg/mL <25  Normetanephrine, Pl     <=148 pg/mL 56  Total Metanephrines-Plasma     <=205 pg/mL 56  Potassium     3.5 - 5.1 mEq/L 4.1   Component     Latest Ref Rng & Units 11/21/2020  ALDOSTERONE      ng/dL <1  Renin Activity     0.25 - 5.82 ng/mL/h 0.28   Component     Latest Ref Rng & Units 10/04/2018  Epinephrine     pg/mL 24  Norepinephrine     pg/mL 167 (L)  Dopamine     pg/mL <10  Total Catecholamines     pg/mL 191 (L)  Metanephrine, Pl     <=57 pg/mL <25  Normetanephrine, Pl     <=148 pg/mL 38  Total Metanephrines-Plasma     <=205 pg/mL 38  ALDOSTERONE      ng/dL 2  Renin Activity     0.25 - 5.82 ng/mL/h 1.48  ALDO / PRA Ratio     0.9 - 28.9 Ratio 1.4  Potassium     3.5 - 5.1 mEq/L 4.9   Component     Latest Ref Rng & Units 11/30/2017  Epinephrine     pg/mL  undetectable  Norepinephrine     pg/mL 99  Dopamine     pg/mL  undetectable  Catecholamines, Total     pg/mL 99  Metanephrine, Pl     <=57  pg/mL <25  Normetanephrine, Pl     <=148 pg/mL 71  Total Metanephrines-Plasma     <=205 pg/mL 71  ALDOSTERONE      ng/dL 3  Renin Activity     0.25 - 5.82 ng/mL/h 1.20  ALDO / PRA Ratio     0.9 - 28.9 Ratio 2.5  Potassium     3.5 - 5.1 mEq/L 4.0   DM2: Reviewed HbA1c levels: 11/01/2020: HbA1c 9.3% Lab Results  Component Value Date   HGBA1C 9.1 (A) 10/28/2020   HGBA1C 8.4 (A) 06/18/2020   HGBA1C 7.2 (A) 02/28/2020  02/09/2020: HbA1c 6.8%  Pt was on a regimen of: - Humalog 75/25 20-26 units before each meal, 2-3X a day depending on the CBG before the meal  - Levemir 100 units at bedtime  Then on: - Tresiba 40 units daily after dialysis - Humalog 20 units before a smaller meal - Humalog 32 units before a larger meal Please do not take Humalog if sugars before a meal are <60. Otherwise, take it whenever you eat.   She was not taking her medications correctly: - Tresiba 50 units daily -she takes it before dialysis despite advice to take it after dialysis - Toujeo 50-56 units after dialysis  - apparently this was added by another doctor (?  They cannot remember name) to be taken 3 times a week after dialysis - Humalog  24 units before a smaller meal 28 units before a larger meal Take Humalog EVERY TIME you eat, 15 min before the meal, if sugars are >60. - Humalog sliding scale: - 150-175: + 1 unit  - 176-200: + 2 units  - 201-225: + 3 units  - 226-250: + 4 units  - 251-275: + 5 units - 276-300: + 6 units  At last visit she was on: - Toujeo 60 units at night (did not decrease the dose as advised by Dr. Dwyane Dee) - Humalog 32-34 units  before meals (did not change the dose as advised by Dr. Dwyane Dee)  We changed to: - Toujeo 54 units in the evening - NovoLog: 24 units before a smaller meal 26-28 units before a regular meal 30 units before a larger meal Take Humalog EVERY TIME you eat, 15 min before the meal, if sugars are >70. - Humalog sliding scale: - 150-175: + 1  unit  - 176-200: + 2 units  - 201-225: + 3 units  - 226-250: + 4 units  - 251-275: + 5 units - 276-300: + 6 units  Try not  to correct sugars at bedtime unless they are >300 and in that case, take no more than 4 units.  Pt checks her sugars >4 times a day with the freestyle libre CGM:  Previously:   Lowest sugar was 31 >> 130 >> 57 >> 51 at waking up; it is unclear at which level she has hypoglycemia awareness. Highest sugar was HI >> 530 >> 499 >> 241 >> 500, then 300s  Glucometer: Accuchek  Pt's meals are: - Breakfast: eggs, bacon, oatmeal, grits - Lunch: salads - Dinner: meat + veggie or sandwich + salad or nabs - Snacks: nabs, fruit cups, jello  -+ ESRD, on dialysis: Lab Results  Component Value Date   BUN 86 (H) 12/02/2020   BUN 54 (H) 11/05/2020   CREATININE 10.13 (H) 12/02/2020   CREATININE 7.69 (H) 11/05/2020  Off olmesartan.  On irbesartan now.  -+ HL; last set of lipids: Lab Results  Component Value Date   CHOL 190 11/21/2020   HDL 43.10 11/21/2020   LDLCALC 114 (H) 03/08/2018   LDLDIRECT 120.0 11/21/2020   TRIG 259 (H) 12/02/2020   CHOLHDL 4 11/21/2020  Previously on Lipitor 40, now on Zetia.  - last eye exam was in 07/2020: no DR  -No numbness but she has tingling in her feet.  She is on Neurontin 300 mg 3 times a day  She also has HTN.  ROS: + See HPI  I reviewed pt's medications, allergies, PMH, social hx, family hx, and changes were documented in the history of present illness. Otherwise, unchanged from my initial visit note.  Past Medical History:  Diagnosis Date   Acid reflux    Anemia    Arthritis    Axillary masses    Soft tissue - status post excision   Back pain    COVID-19 virus infection 04/06/2019   Depression    End-stage renal disease (HCC)    [facility]  in [place] hypertension    History of blood transfusion    History of cardiac catheterization    Normal coronary arteries October 2020   History of  claustrophobia    History of pneumonia 2019   Hypoxia 04/03/2019   Mixed hyperlipidemia    Obesity    Pancreatitis    Peritoneal dialysis catheter in place [facility])    Pneumonia due to COVID-19 virus 04/02/2019   Sleep apnea    Noncompliant with CPAP   Type 2 diabetes mellitus ([place])    Past Surgical History:  Procedure Laterality Date   ABDOMINAL HYSTERECTOMY     AV FISTULA PLACEMENT Left 09/02/2017   Procedure: creation of left arm ARTERIOVENOUS (AV) FISTULA;  Surgeon: Serafina Mitchell, MD;  Location: [facility];  Service: Vascular;  Laterality: Left;   COLONOSCOPY  2008   Dr. Oneida Alar: normal    COLONOSCOPY N/A 12/18/2016   Dr. Oneida Alar: multiple tubular adenomas, internal hemorrhoids. Surveillance in 3 years    ESOPHAGEAL DILATION N/A 10/13/2015   Procedure: ESOPHAGEAL DILATION;  Surgeon: Rogene Houston, MD;  [facility];  Service: Endoscopy;  Laterality: N/A;   ESOPHAGOGASTRODUODENOSCOPY N/A 10/13/2015   Dr. Laural Golden: chronic gastritis on path, no H.pylori. Empiric dilation    ESOPHAGOGASTRODUODENOSCOPY N/A 12/18/2016   Dr. Oneida Alar: mild gastritis. BRAVO study revealed uncontrolled GERD. Dysphagia secondary to uncontrolled reflux   FOOT SURGERY Bilateral    "nerve"     LEFT HEART CATH AND CORONARY ANGIOGRAPHY N/A 12/29/2018   Procedure: LEFT HEART CATH AND CORONARY ANGIOGRAPHY;  Surgeon:  Jettie Booze, MD;  Location: [facility];  Service: Cardiovascular;  Laterality: N/A;   LUNG BIOPSY     MASS EXCISION Right 01/09/2013   Procedure: EXCISION OF NEOPLASM OF RIGHT  AXILLA  AND EXCISION OF NEOPLASM OF LEFT AXILLA;  Surgeon: Jamesetta So, MD;  Location: [facility];  Service: General;  Laterality: Right;  procedure end @ 08:23   MYRINGOTOMY WITH TUBE PLACEMENT Bilateral 04/28/2017   Procedure: BILATERAL MYRINGOTOMY WITH TUBE PLACEMENT;  Surgeon: Leta Baptist, MD;  Location: [place];  Service: ENT;  Laterality: Bilateral;   REVISION OF ARTERIOVENOUS GORETEX GRAFT Left 05/04/2018    Procedure: TRANSPOSITION OF CEPHALIC VEIN ARTERIOVENOUS FISTULA LEFT ARM;  Surgeon: Rosetta Posner, MD;  Location: [place];  Service: Vascular;  Laterality: Left;   SAVORY DILATION N/A 12/18/2016   Procedure: SAVORY DILATION;  Surgeon: Danie Binder, MD;  [facility];  Service: Endoscopy;  Laterality: N/A;   Social History   Socioeconomic History   Marital status: Married    Spouse name: Not on file   Number of children: 2  Occupational History   CNA  Tobacco Use   Smoking status: Never Smoker   Smokeless tobacco: Never Used  Substance and Sexual Activity   Alcohol use: No   Drug use: No   Current Outpatient Medications on File Prior to Visit  Medication Sig Dispense Refill   amLODipine (NORVASC) 5 MG tablet On dialysis days (Monday, Wednesday, Friday) hold the morning dose - all other days may take twice a day as planned     aspirin 81 MG chewable tablet CHEW 2 TABLETS BY MOUTH ONCE DAILY 56 tablet 11   B Complex-C-Folic Acid (RENA-VITE RX) 1 MG TABS Take 1 tablet by mouth daily.     calcitRIOL (ROCALTROL) 0.25 MCG capsule Take 0.25 capsules by mouth.     Chlorpheniramine-DM (CORICIDIN COUGH/COLD) 4-30 MG TABS Take 1 tablet by mouth every 6 (six) hours as needed. 28 tablet 0   cloNIDine (CATAPRES) 0.2 MG tablet Take 0.2 mg by mouth 2 (two) times daily.     Continuous Blood Gluc Sensor (FREESTYLE LIBRE 2 SENSOR) MISC 1 each by Does not apply route every 14 (fourteen) days. 6 each 3   cyclobenzaprine (FLEXERIL) 5 MG tablet TAKE ONE TABLET BY MOUTH AT BEDTIME AS NEEDED FOR MUSCLE SPASMS. 30 tablet 0   DULoxetine (CYMBALTA) 60 MG capsule TAKE (1) CAPSULE BY MOUTH ONCE DAILY. 28 capsule 11   ezetimibe (ZETIA) 10 MG tablet TAKE 1 TABLET BY MOUTH ONCE DAILY. 28 tablet 11   HYDROcodone-acetaminophen (NORCO/VICODIN) 5-325 MG tablet Take 1 tablet by mouth every 6 (six) hours as needed for moderate pain. 30 tablet 0   insulin aspart protamine - aspart (NOVOLOG 70/30 FLEXPEN) (70-30)  100 UNIT/ML FlexPen Inject 24-30 Units into the skin 3 (three) times daily before meals. 30 mL 5   Insulin Glargine, 2 Unit Dial, (TOUJEO MAX SOLOSTAR) 300 UNIT/ML SOPN Inject 50-56 Units into the skin See admin instructions. Take after dialysis 3 day a week. 3 pen 11   irbesartan (AVAPRO) 150 MG tablet SMARTSIG:1 Tablet(s) By Mouth Every Evening     isosorbide mononitrate (IMDUR) 30 MG 24 hr tablet TAKE 1 TABLET BY MOUTH ONCE DAILY. 28 tablet 11   levalbuterol (XOPENEX) 1.25 MG/0.5ML nebulizer solution Take 1.25 mg by nebulization every 4 (four) hours as needed for wheezing or shortness of breath. 1 each 0   Melatonin 3 MG TBDP Take one tablet by  mouth at bedtime , for sleep 30 tablet 2   metoprolol succinate (TOPROL-XL) 50 MG 24 hr tablet On dialysis day (Monday, Wednesday, Friday) hold this medication till after dialysis is done - may take when you get home.  Take daily on all other days as prescribed.     midodrine (PROAMATINE) 10 MG tablet TAKE 1 TABLET BY MOUTH TWICE DAILY 56 tablet 5   ondansetron (ZOFRAN) 4 MG tablet Take 1 tablet (4 mg total) by mouth every 8 (eight) hours as needed for nausea or vomiting. 20 tablet 0   phenol (CHLORASEPTIC) 1.4 % LIQD Use as directed 1 spray in the mouth or throat as needed for throat irritation / pain. 177 mL 0   pregabalin (LYRICA) 50 MG capsule TAKE (1) CAPSULE BY MOUTH THREE TIMES DAILY 84 capsule 0   sertraline (ZOLOFT) 100 MG tablet TAKE 1 TABLET BY MOUTH EVERY DAY 90 tablet 2   torsemide (DEMADEX) 100 MG tablet Take 1 tablet (100 mg total) by mouth 2 (two) times daily. On dialysis day (Monday, Wednesday, Friday) hold the morning dose - may take evening dose as planned.  All other days, may take twice a day as planned.     triamcinolone cream (KENALOG) 0.1 % APPLY TO AFFECTED AREA TWICE A DAY 30 g 0   ULTICARE MINI PEN NEEDLES 31G X 6 MM MISC USE AS DIRECTED 100 each 0   [name] 500 MG chewable tablet Chew 500 mg by mouth See admin instructions. Take  500 mg with each snack and meal     zolpidem (AMBIEN) 5 MG tablet TAKE 1 TABLET BY MOUTH AT BEDTIME AS NEEDED. 30 tablet 0   [DISCONTINUED] FLUoxetine (PROZAC) 10 MG capsule Take 10 mg by mouth daily.       [DISCONTINUED] glipiZIDE (GLUCOTROL) 10 MG tablet Take 10 mg by mouth 2 (two) times daily before a meal.       Current Facility-Administered Medications on File Prior to Visit  Medication Dose Route Frequency Provider Last Rate Last Admin   0.9 %  sodium chloride infusion   Intravenous Continuous Lockamy, Randi L, NP-C 20 mL/hr at 07/19/19 1641 Rate Verify at 07/19/19 1641   Allergies  Allergen Reactions   Ace Inhibitors Anaphylaxis and Swelling   Penicillins Itching, Swelling and Other (See Comments)    Did it involve swelling of the face/tongue/throat, SOB, or low BP? Unknown Did it involve sudden or severe rash/hives, skin peeling, or any reaction on the inside of your mouth or nose? Unknown Did you need to seek medical attention at a hospital or doctor's office? Unknown When did it last happen?      years  If all above answers are "NO", may proceed with cephalosporin use.    Statins Other (See Comments)    elevated LFT's     Albuterol Swelling   Family History  Problem Relation Age of Onset   Hypertension Father    Hypercholesterolemia Father    Arthritis Father    Hypertension Sister    Hypercholesterolemia Sister    Breast cancer Sister    Hypertension Sister    Colon cancer Neg Hx    Colon polyps Neg Hx     PE: There were no vitals taken for this visit. Wt Readings from Last 3 Encounters:  02/10/21 213 lb 6.4 oz (96.8 kg)  01/02/21 207 lb 0.6 oz (93.9 kg)  12/02/20 198 lb (89.8 kg)   Constitutional: overweight, in NAD Eyes: PERRLA, EOMI, no exophthalmos ENT: moist  mucous membranes, no thyromegaly, no cervical lymphadenopathy Cardiovascular: RRR, No RG, +1/6 SEM, + bilateral leg swelling, pitting Respiratory: CTA B Gastrointestinal: abdomen soft, NT, ND,  BS+ Musculoskeletal: no deformities, strength intact in all 4 Skin: moist, warm, no rashes Neurological: no tremor with outstretched hands, DTR normal in all 4  ASSESSMENT: 1. DM2, insulin-dependent, uncontrolled, with complications - CKD stage 4 - PN  2. HL  3. B adrenal adenomas  PLAN:  1. Patient with longstanding, uncontrolled, type 2 diabetes, previously on a premixed insulin regimen and long-acting insulin, then on basal-bolus insulin regimen, which is still elevated HbA1c at last check, 9.3% in 10/2020.  At last visit, she finally obtained a CGM and, per review of the downloaded reports, her sugars were very fluctuating.  She previously saw my colleague when I was out of the office and he suggested changes in her insulin regimen, but she did not make them as she forgot... At last visit I advised her to decrease the Toujeo dose and adjust the rapid acting insulin based on the size and consistency of the meals.  She was forgetting to take insulin 15 minutes before meals and I advised her how to bolus insulin if she forgets to do it before hand.  I advised her to only take 50% of the recommended dose if she remembers within an hour after she was eating but only take correction insulin if she remembers more than an hour after starting eating.  I also advised her not to correct bedtime blood sugars lower than 300 and if she did so, not to take more than 4 units to avoid low blood sugars at night.  I did recommend at last visit a sensor with alarms and I sent a prescription for the freestyle libre 2 to her pharmacy.  She and her daughter were interested in an insulin pump.  I am not completely sure whether she manages but I did refer her to see the diabetes educator and see whether she was willing/able to maneuver the pump (she did not have this appointment yet).  Discussed about available pumps on the market. CGM interpretation: -At today's visit, we reviewed her CGM downloads: It appears that ***  of values are in target range (goal >70%), while *** are higher than 180 (goal <25%), and *** are lower than 70 (goal <4%).  The calculated average blood sugar is ***.  The projected HbA1c for the next 3 months (GMI) is ***. -Reviewing the CGM trends, ***  - I suggested to:  Patient Instructions  Please continue: - Toujeo 54 units in the evening - Novolog : 24 units before a smaller meal 26-28 units before a regular meal 30 units before a larger meal Take Humalog EVERY TIME you eat, 15 min before the meal, if sugars are >70. - Novolog sliding scale: - 150-175: + 1 unit  - 176-200: + 2 units  - 201-225: + 3 units  - 226-250: + 4 units  - 251-275: + 5 units - 276-300: + 6 units  Try not to correct sugars at bedtime unless they are >300 and in that case, take no more than 4 units.  Check with your insurance if the following pumps are covered: - Medtronic - Omnipod - t:slim x2 Let me know what they say.  Please return in 3 months with your sugar log.     - we checked her HbA1c: 7%  - advised to check sugars at different times of the day - 4x a  day, rotating check times - advised for yearly eye exams >> she is UTD - return to clinic in 3 months  2. HL -Reviewed latest lipid panel from 11/2020: LDL above target of less than 70: Lab Results  Component Value Date   CHOL 190 11/21/2020   HDL 43.10 11/21/2020   LDLCALC 114 (H) 03/08/2018   LDLDIRECT 120.0 11/21/2020   TRIG 259 (H) 12/02/2020   CHOLHDL 4 11/21/2020  -Continues on Zetia 10 mg daily without side effects.  She had transaminitis from statins  3.  Bilateral adrenal adenoma -Patient with history of right lateral adrenal adenomas per review of abdominal MRI from 2019.  The imaging characteristics pointed towards benign tumors, up to 2.5 and 1.6 cm, respectively.  However, they have appears to increase over time.  Comparing the results of the MRI from 2010 with dose of the CT from 2017 saline bolus, in the left adrenal.   She had another MRI in 03/2018 which showed a stable left 1.9 cm adrenal nodule. -Plasma catecholamines, metanephrines, aldosterone and renin were normal in 2019, 2020, and 2022 (at last visit). -However, we are having problems taking her autonomic cortisol production.  She had slightly unsuppressed dexamethasone suppression test, which is expected in patients on dialysis.  We cannot perform a 24-hour urine cortisol due to dialysis status.  We checked the salivary cortisol, which was also elevated, but at that time, she was on a multivitamin with biotin.  I advised her to stop this for a few days before checking another salivary cortisol. -The results of the new test are pending  [name], MD PhD [facility]

## 2021-02-24 DIAGNOSIS — N186 End stage renal disease: Secondary | ICD-10-CM | POA: Diagnosis not present

## 2021-02-24 DIAGNOSIS — D509 Iron deficiency anemia, unspecified: Secondary | ICD-10-CM | POA: Diagnosis not present

## 2021-02-24 DIAGNOSIS — Z992 Dependence on renal dialysis: Secondary | ICD-10-CM | POA: Diagnosis not present

## 2021-02-24 DIAGNOSIS — N2581 Secondary hyperparathyroidism of renal origin: Secondary | ICD-10-CM | POA: Diagnosis not present

## 2021-02-24 DIAGNOSIS — D631 Anemia in chronic kidney disease: Secondary | ICD-10-CM | POA: Diagnosis not present

## 2021-02-26 DIAGNOSIS — D631 Anemia in chronic kidney disease: Secondary | ICD-10-CM | POA: Diagnosis not present

## 2021-02-26 DIAGNOSIS — N2581 Secondary hyperparathyroidism of renal origin: Secondary | ICD-10-CM | POA: Diagnosis not present

## 2021-02-26 DIAGNOSIS — Z992 Dependence on renal dialysis: Secondary | ICD-10-CM | POA: Diagnosis not present

## 2021-02-26 DIAGNOSIS — D509 Iron deficiency anemia, unspecified: Secondary | ICD-10-CM | POA: Diagnosis not present

## 2021-02-26 DIAGNOSIS — N186 End stage renal disease: Secondary | ICD-10-CM | POA: Diagnosis not present

## 2021-02-27 NOTE — Telephone Encounter (Signed)
Per nurse contacted Randall Hiss at [phone] he will refax the form.

## 2021-02-27 NOTE — Telephone Encounter (Signed)
I have not seen these. They need to be faxed to either [facility] or [facility] and confirm they have the correct fax number please. Looks like this was 2 weeks ago

## 2021-02-28 DIAGNOSIS — D631 Anemia in chronic kidney disease: Secondary | ICD-10-CM | POA: Diagnosis not present

## 2021-02-28 DIAGNOSIS — Z992 Dependence on renal dialysis: Secondary | ICD-10-CM | POA: Diagnosis not present

## 2021-02-28 DIAGNOSIS — N186 End stage renal disease: Secondary | ICD-10-CM | POA: Diagnosis not present

## 2021-02-28 DIAGNOSIS — N2581 Secondary hyperparathyroidism of renal origin: Secondary | ICD-10-CM | POA: Diagnosis not present

## 2021-02-28 DIAGNOSIS — D509 Iron deficiency anemia, unspecified: Secondary | ICD-10-CM | POA: Diagnosis not present

## 2021-03-03 ENCOUNTER — Telehealth: Payer: Self-pay | Admitting: Orthopedic Surgery

## 2021-03-03 DIAGNOSIS — N186 End stage renal disease: Secondary | ICD-10-CM | POA: Diagnosis not present

## 2021-03-03 DIAGNOSIS — D631 Anemia in chronic kidney disease: Secondary | ICD-10-CM | POA: Diagnosis not present

## 2021-03-03 DIAGNOSIS — D509 Iron deficiency anemia, unspecified: Secondary | ICD-10-CM | POA: Diagnosis not present

## 2021-03-03 DIAGNOSIS — N2581 Secondary hyperparathyroidism of renal origin: Secondary | ICD-10-CM | POA: Diagnosis not present

## 2021-03-03 DIAGNOSIS — Z992 Dependence on renal dialysis: Secondary | ICD-10-CM | POA: Diagnosis not present

## 2021-03-03 NOTE — Telephone Encounter (Signed)
I called patient back to offer appointment per [facility] response. Said her appointment with primary care is atill 03/06/21.

## 2021-03-03 NOTE — Telephone Encounter (Signed)
Patient called to ask if she can see Dr Aline Brochure, today if we can, or as soon as possible-said having pain from her shoulder all the way down her side and her whole leg; states she is scheduled for 03/06/21 with primary care Dr Moshe Cipro. Notes indicate patient was last seen in 2021, and did not attend appointments scheduled in 2022. Relayed to patient we can schedule, although as a specialty office, we would need to schedule for one specific problem or area. Please review and advise. In meantime, patient is checking with primary care to find out if they may have a cancellation for sooner than 12/15.

## 2021-03-05 ENCOUNTER — Other Ambulatory Visit: Payer: Self-pay | Admitting: Family Medicine

## 2021-03-06 ENCOUNTER — Ambulatory Visit (INDEPENDENT_AMBULATORY_CARE_PROVIDER_SITE_OTHER): Payer: Medicare Other | Admitting: Family Medicine

## 2021-03-06 ENCOUNTER — Other Ambulatory Visit: Payer: Self-pay

## 2021-03-06 ENCOUNTER — Encounter: Payer: Self-pay | Admitting: Family Medicine

## 2021-03-06 VITALS — BP 162/65 | HR 78 | Resp 17 | Ht 64.0 in | Wt 212.0 lb

## 2021-03-06 DIAGNOSIS — I152 Hypertension secondary to endocrine disorders: Secondary | ICD-10-CM

## 2021-03-06 DIAGNOSIS — J189 Pneumonia, unspecified organism: Secondary | ICD-10-CM

## 2021-03-06 DIAGNOSIS — Z8701 Personal history of pneumonia (recurrent): Secondary | ICD-10-CM | POA: Diagnosis not present

## 2021-03-06 DIAGNOSIS — N186 End stage renal disease: Secondary | ICD-10-CM | POA: Diagnosis not present

## 2021-03-06 DIAGNOSIS — Z794 Long term (current) use of insulin: Secondary | ICD-10-CM

## 2021-03-06 DIAGNOSIS — F322 Major depressive disorder, single episode, severe without psychotic features: Secondary | ICD-10-CM | POA: Diagnosis not present

## 2021-03-06 DIAGNOSIS — E782 Mixed hyperlipidemia: Secondary | ICD-10-CM | POA: Diagnosis not present

## 2021-03-06 DIAGNOSIS — E1122 Type 2 diabetes mellitus with diabetic chronic kidney disease: Secondary | ICD-10-CM

## 2021-03-06 DIAGNOSIS — Z8673 Personal history of transient ischemic attack (TIA), and cerebral infarction without residual deficits: Secondary | ICD-10-CM | POA: Diagnosis not present

## 2021-03-06 DIAGNOSIS — Z1231 Encounter for screening mammogram for malignant neoplasm of breast: Secondary | ICD-10-CM | POA: Diagnosis not present

## 2021-03-06 DIAGNOSIS — Z9181 History of falling: Secondary | ICD-10-CM | POA: Diagnosis not present

## 2021-03-06 DIAGNOSIS — R9389 Abnormal findings on diagnostic imaging of other specified body structures: Secondary | ICD-10-CM

## 2021-03-06 DIAGNOSIS — G629 Polyneuropathy, unspecified: Secondary | ICD-10-CM | POA: Diagnosis not present

## 2021-03-06 DIAGNOSIS — Z8616 Personal history of COVID-19: Secondary | ICD-10-CM | POA: Diagnosis not present

## 2021-03-06 DIAGNOSIS — Z992 Dependence on renal dialysis: Secondary | ICD-10-CM

## 2021-03-06 DIAGNOSIS — E1159 Type 2 diabetes mellitus with other circulatory complications: Secondary | ICD-10-CM

## 2021-03-06 DIAGNOSIS — H9203 Otalgia, bilateral: Secondary | ICD-10-CM | POA: Diagnosis not present

## 2021-03-06 DIAGNOSIS — Z7982 Long term (current) use of aspirin: Secondary | ICD-10-CM | POA: Diagnosis not present

## 2021-03-06 NOTE — Assessment & Plan Note (Signed)
Kathryn Beck is reminded of the importance of commitment to daily physical activity for 30 minutes or more, as able and the need to limit carbohydrate intake to 30 to 60 grams per meal to help with blood sugar control.   The need to take medication as prescribed, test blood sugar as directed, and to call between visits if there is a concern that blood sugar is uncontrolled is also discussed.   Kathryn Beck is reminded of the importance of daily foot exam, annual eye examination, and good blood sugar, blood pressure and cholesterol control. Uncontrolled, managed by endo  Diabetic Labs Latest Ref Rng & Units 12/02/2020 11/21/2020 11/05/2020 10/28/2020 06/18/2020  HbA1c 4.0 - 5.6 % - - - 9.1(A) 8.4(A)  Microalbumin Not estab mg/dL - - - - -  Micro/Creat Ratio <30 mcg/mg creat - - - - -  Chol 0 - 200 mg/dL - 190 - - -  HDL >39.00 mg/dL - 43.10 - - -  Calc LDL 0 - 99 mg/dL - - - - -  Triglycerides <150 mg/dL 259(H) 272.0(H) - - -  Creatinine 0.44 - 1.00 mg/dL 10.13(H) - 7.69(H) - -   BP/Weight 03/06/2021 02/10/2021 01/02/2021 12/02/2020 11/21/2020 1/61/0960 06/25/4096  Systolic BP 119 147 829 562 130 865 784  Diastolic BP 65 82 67 77 82 69 98  Wt. (Lbs) 212 213.4 207.04 198 206 199.96 201  BMI 36.39 36.63 35.54 33.99 35.36 34.32 34.5   Foot/eye exam completion dates Latest Ref Rng & Units 03/06/2021 08/07/2020  Eye Exam No Retinopathy - No Retinopathy  Foot Form Completion - Done -

## 2021-03-06 NOTE — Patient Instructions (Addendum)
F/u In 4 months, call if you need me sooner,  Please get CXR today after you leave   Foot exam and podiatry referral today  Please schedule mammogram at checkout  You are referred to Dr Benjamine Mola for ear pain with tubes in place Thankful you are much improved  Thanks for choosing [facility], we consider it a privelige to serve you.

## 2021-03-07 DIAGNOSIS — Z992 Dependence on renal dialysis: Secondary | ICD-10-CM | POA: Diagnosis not present

## 2021-03-07 DIAGNOSIS — D631 Anemia in chronic kidney disease: Secondary | ICD-10-CM | POA: Diagnosis not present

## 2021-03-07 DIAGNOSIS — N186 End stage renal disease: Secondary | ICD-10-CM | POA: Diagnosis not present

## 2021-03-07 DIAGNOSIS — D509 Iron deficiency anemia, unspecified: Secondary | ICD-10-CM | POA: Diagnosis not present

## 2021-03-07 DIAGNOSIS — N2581 Secondary hyperparathyroidism of renal origin: Secondary | ICD-10-CM | POA: Diagnosis not present

## 2021-03-10 ENCOUNTER — Encounter: Payer: Self-pay | Admitting: Family Medicine

## 2021-03-10 DIAGNOSIS — N186 End stage renal disease: Secondary | ICD-10-CM | POA: Diagnosis not present

## 2021-03-10 DIAGNOSIS — D631 Anemia in chronic kidney disease: Secondary | ICD-10-CM | POA: Diagnosis not present

## 2021-03-10 DIAGNOSIS — N2581 Secondary hyperparathyroidism of renal origin: Secondary | ICD-10-CM | POA: Diagnosis not present

## 2021-03-10 DIAGNOSIS — H9203 Otalgia, bilateral: Secondary | ICD-10-CM | POA: Insufficient documentation

## 2021-03-10 DIAGNOSIS — J189 Pneumonia, unspecified organism: Secondary | ICD-10-CM | POA: Insufficient documentation

## 2021-03-10 DIAGNOSIS — D509 Iron deficiency anemia, unspecified: Secondary | ICD-10-CM | POA: Diagnosis not present

## 2021-03-10 DIAGNOSIS — R9389 Abnormal findings on diagnostic imaging of other specified body structures: Secondary | ICD-10-CM | POA: Insufficient documentation

## 2021-03-10 DIAGNOSIS — Z992 Dependence on renal dialysis: Secondary | ICD-10-CM | POA: Diagnosis not present

## 2021-03-10 NOTE — Assessment & Plan Note (Signed)
°  Patient re-educated about  the importance of commitment to a  minimum of 150 minutes of exercise per week as able.  The importance of healthy food choices with portion control discussed, as well as eating regularly and within a 12 hour window most days. The need to choose "clean , green" food 50 to 75% of the time is discussed, as well as to make water the primary drink and set a goal of 64 ounces water daily.    Weight /BMI 03/06/2021 02/10/2021 01/02/2021  WEIGHT 212 lb 213 lb 6.4 oz 207 lb 0.6 oz  HEIGHT 5\' 4"  5\' 4"  5\' 4"   BMI 36.39 kg/m2 36.63 kg/m2 35.54 kg/m2

## 2021-03-10 NOTE — Assessment & Plan Note (Signed)
CXR in 11/2020 suspicious for infection ,needs f/u same ordered

## 2021-03-10 NOTE — Assessment & Plan Note (Signed)
Was on transplant list , however due to uncontrolled blood sugar , temporarily removed

## 2021-03-10 NOTE — Progress Notes (Signed)
Kathryn Beck     MRN: [id number]      DOB: 1956/02/06   HPI Kathryn Beck is here for follow up and re-evaluation of chronic medical conditions, medication management and review of any available recent lab and radiology data.  Preventive health is updated, specifically  Cancer screening and Immunization.   Questions or concerns regarding consultations or procedures which the PT has had in the interim are  addressed. The PT denies any adverse reactions to current medications since the last visit.  C/o bilateral ear pain for weeks, worsening, has tubes placed , needs asap appt with ENT  ROS Denies recent fever or chills. Denies sinus pressure, nasal congestion,  or sore throat. Denies chest congestion, productive cough or wheezing. Denies chest pains, palpitations and leg swelling Denies abdominal pain, nausea, vomiting,diarrhea or constipation.   Denies dysuria, frequency, hesitancy or incontinence. Chronic  joint pain, swelling and limitation in mobility. Denies headaches, seizures, numbness, or tingling. Denies uncontrolled depression, anxiety or insomnia. Denies skin break down or rash.   PE  BP (!) 162/65    Pulse 78    Resp 17    Ht 5\' 4"  (1.626 m)    Wt 212 lb (96.2 kg)    SpO2 93%    BMI 36.39 kg/m   Patient alert and oriented and in no cardiopulmonary distress.  HEENT: No facial asymmetry, EOMI,     Neck supple .  Chest: Clear to auscultation bilaterally.  CVS: S1, S2 systolic  murmur, no S3.Regular rate.  ABD: Soft non tender.   Ext: No edema  MS: decreased  ROM spine, shoulders, hips and knees.  Skin: Intact, no ulcerations or rash noted.  Psych: Good eye contact, normal affect. Memory intact not anxious or depressed appearing.  CNS: CN 2-12 intact, power,  normal throughout.no focal deficits noted.   Assessment & Plan  Diabetes mellitus [facility]) Kathryn Beck is reminded of the importance of commitment to daily physical activity for 30 minutes or more, as  able and the need to limit carbohydrate intake to 30 to 60 grams per meal to help with blood sugar control.   The need to take medication as prescribed, test blood sugar as directed, and to call between visits if there is a concern that blood sugar is uncontrolled is also discussed.   [name] is reminded of the importance of daily foot exam, annual eye examination, and good blood sugar, blood pressure and cholesterol control. Uncontrolled, managed by endo  Diabetic Labs Latest Ref Rng & Units 12/02/2020 11/21/2020 11/05/2020 10/28/2020 06/18/2020  HbA1c 4.0 - 5.6 % - - - 9.1(A) 8.4(A)  Microalbumin Not estab mg/dL - - - - -  Micro/Creat Ratio <30 mcg/mg creat - - - - -  Chol 0 - 200 mg/dL - 190 - - -  HDL >39.00 mg/dL - 43.10 - - -  Calc LDL 0 - 99 mg/dL - - - - -  Triglycerides <150 mg/dL 259(H) 272.0(H) - - -  Creatinine 0.44 - 1.00 mg/dL 10.13(H) - 7.69(H) - -   BP/Weight 03/06/2021 02/10/2021 01/02/2021 12/02/2020 11/21/2020 09/09/5091 04/28/7122  Systolic BP 580 998 338 250 539 767 341  Diastolic BP 65 82 67 77 82 69 98  Wt. (Lbs) 212 213.4 207.04 198 206 199.96 201  BMI 36.39 36.63 35.54 33.99 35.36 34.32 34.5   Foot/eye exam completion dates Latest Ref Rng & Units 03/06/2021 08/07/2020  Eye Exam No Retinopathy - No Retinopathy  Foot Form Completion - Done -  Hypertension associated with diabetes ([place]) DASH diet and commitment to daily physical activity for a minimum of 30 minutes discussed and encouraged, as a part of hypertension management. The importance of attaining a healthy weight is also discussed.  BP/Weight 03/06/2021 02/10/2021 01/02/2021 12/02/2020 11/21/2020 9/92/4268 05/24/1960  Systolic BP 229 798 921 194 174 081 448  Diastolic BP 65 82 67 77 82 69 98  Wt. (Lbs) 212 213.4 207.04 198 206 199.96 201  BMI 36.39 36.63 35.54 33.99 35.36 34.32 34.5     No med change, has episodes of hypotension while on dialysis  ESRD on dialysis [facility]) Was on transplant list ,  however due to uncontrolled blood sugar , temporarily removed  Depression, major, single episode, severe (HCC) Controlled, no change in medication   Hyperlipidemia Hyperlipidemia:Low fat diet discussed and encouraged.   Lipid Panel  Lab Results  Component Value Date   CHOL 190 11/21/2020   HDL 43.10 11/21/2020   LDLCALC 114 (H) 03/08/2018   LDLDIRECT 120.0 11/21/2020   TRIG 259 (H) 12/02/2020   CHOLHDL 4 11/21/2020  needs  to reduce fat intake     Uncontrolled type 2 diabetes mellitus with ESRD (end-stage renal disease) ([place]) Kathryn Beck is reminded of the importance of commitment to daily physical activity for 30 minutes or more, as able and the need to limit carbohydrate intake to 30 to 60 grams per meal to help with blood sugar control.   The need to take medication as prescribed, test blood sugar as directed, and to call between visits if there is a concern that blood sugar is uncontrolled is also discussed.   Kathryn Beck is reminded of the importance of daily foot exam, annual eye examination, and good blood sugar, blood pressure and cholesterol control. Managed by endo Diabetic Labs Latest Ref Rng & Units 12/02/2020 11/21/2020 11/05/2020 10/28/2020 06/18/2020  HbA1c 4.0 - 5.6 % - - - 9.1(A) 8.4(A)  Microalbumin Not estab mg/dL - - - - -  Micro/Creat Ratio <30 mcg/mg creat - - - - -  Chol 0 - 200 mg/dL - 190 - - -  HDL >39.00 mg/dL - 43.10 - - -  Calc LDL 0 - 99 mg/dL - - - - -  Triglycerides <150 mg/dL 259(H) 272.0(H) - - -  Creatinine 0.44 - 1.00 mg/dL 10.13(H) - 7.69(H) - -   BP/Weight 03/06/2021 02/10/2021 01/02/2021 12/02/2020 11/21/2020 1/85/6314 11/27/261  Systolic BP 785 885 027 741 287 867 672  Diastolic BP 65 82 67 77 82 69 98  Wt. (Lbs) 212 213.4 207.04 198 206 199.96 201  BMI 36.39 36.63 35.54 33.99 35.36 34.32 34.5   Foot/eye exam completion dates Latest Ref Rng & Units 03/06/2021 08/07/2020  Eye Exam No Retinopathy - No Retinopathy  Foot Form Completion - Done  -        Abnormal CXR CXR in 11/2020 suspicious for infection ,needs f/u same ordered  Otalgia, bilateral Needs ENT eval, reports worsening discofort and has had tubes placed

## 2021-03-10 NOTE — Assessment & Plan Note (Signed)
Controlled, no change in medication  

## 2021-03-10 NOTE — Assessment & Plan Note (Signed)
Kathryn Beck is reminded of the importance of commitment to daily physical activity for 30 minutes or more, as able and the need to limit carbohydrate intake to 30 to 60 grams per meal to help with blood sugar control.   The need to take medication as prescribed, test blood sugar as directed, and to call between visits if there is a concern that blood sugar is uncontrolled is also discussed.   Kathryn Beck is reminded of the importance of daily foot exam, annual eye examination, and good blood sugar, blood pressure and cholesterol control. Managed by endo Diabetic Labs Latest Ref Rng & Units 12/02/2020 11/21/2020 11/05/2020 10/28/2020 06/18/2020  HbA1c 4.0 - 5.6 % - - - 9.1(A) 8.4(A)  Microalbumin Not estab mg/dL - - - - -  Micro/Creat Ratio <30 mcg/mg creat - - - - -  Chol 0 - 200 mg/dL - 190 - - -  HDL >39.00 mg/dL - 43.10 - - -  Calc LDL 0 - 99 mg/dL - - - - -  Triglycerides <150 mg/dL 259(H) 272.0(H) - - -  Creatinine 0.44 - 1.00 mg/dL 10.13(H) - 7.69(H) - -   BP/Weight 03/06/2021 02/10/2021 01/02/2021 12/02/2020 11/21/2020 0/17/4944 11/26/7589  Systolic BP 638 466 599 357 017 793 903  Diastolic BP 65 82 67 77 82 69 98  Wt. (Lbs) 212 213.4 207.04 198 206 199.96 201  BMI 36.39 36.63 35.54 33.99 35.36 34.32 34.5   Foot/eye exam completion dates Latest Ref Rng & Units 03/06/2021 08/07/2020  Eye Exam No Retinopathy - No Retinopathy  Foot Form Completion - Done -

## 2021-03-10 NOTE — Assessment & Plan Note (Signed)
Needs ENT eval, reports worsening discofort and has had tubes placed

## 2021-03-10 NOTE — Assessment & Plan Note (Signed)
Hyperlipidemia:Low fat diet discussed and encouraged.   Lipid Panel  Lab Results  Component Value Date   CHOL 190 11/21/2020   HDL 43.10 11/21/2020   LDLCALC 114 (H) 03/08/2018   LDLDIRECT 120.0 11/21/2020   TRIG 259 (H) 12/02/2020   CHOLHDL 4 11/21/2020  needs  to reduce fat intake

## 2021-03-10 NOTE — Assessment & Plan Note (Signed)
Needs f/u CXR to establish clearance

## 2021-03-10 NOTE — Assessment & Plan Note (Signed)
DASH diet and commitment to daily physical activity for a minimum of 30 minutes discussed and encouraged, as a part of hypertension management. The importance of attaining a healthy weight is also discussed.  BP/Weight 03/06/2021 02/10/2021 01/02/2021 12/02/2020 11/21/2020 3/75/0510 09/21/2522  Systolic BP 799 800 123 935 940 905 025  Diastolic BP 65 82 67 77 82 69 98  Wt. (Lbs) 212 213.4 207.04 198 206 199.96 201  BMI 36.39 36.63 35.54 33.99 35.36 34.32 34.5     No med change, has episodes of hypotension while on dialysis

## 2021-03-12 DIAGNOSIS — N2581 Secondary hyperparathyroidism of renal origin: Secondary | ICD-10-CM | POA: Diagnosis not present

## 2021-03-12 DIAGNOSIS — Z992 Dependence on renal dialysis: Secondary | ICD-10-CM | POA: Diagnosis not present

## 2021-03-12 DIAGNOSIS — D631 Anemia in chronic kidney disease: Secondary | ICD-10-CM | POA: Diagnosis not present

## 2021-03-12 DIAGNOSIS — N186 End stage renal disease: Secondary | ICD-10-CM | POA: Diagnosis not present

## 2021-03-12 DIAGNOSIS — D509 Iron deficiency anemia, unspecified: Secondary | ICD-10-CM | POA: Diagnosis not present

## 2021-03-13 ENCOUNTER — Other Ambulatory Visit: Payer: Self-pay

## 2021-03-13 ENCOUNTER — Ambulatory Visit (INDEPENDENT_AMBULATORY_CARE_PROVIDER_SITE_OTHER): Payer: Medicare Other | Admitting: Orthopedic Surgery

## 2021-03-13 ENCOUNTER — Encounter: Payer: Self-pay | Admitting: Orthopedic Surgery

## 2021-03-13 VITALS — BP 212/101 | HR 91 | Ht 64.0 in | Wt 208.2 lb

## 2021-03-13 DIAGNOSIS — M7552 Bursitis of left shoulder: Secondary | ICD-10-CM | POA: Diagnosis not present

## 2021-03-13 DIAGNOSIS — M25512 Pain in left shoulder: Secondary | ICD-10-CM

## 2021-03-13 NOTE — Progress Notes (Signed)
Kathryn Beck requests an injection in her left shoulder  Chief Complaint  Patient presents with   Shoulder Pain    LT Patient request injection if able    Procedure note the subacromial injection shoulder left   Verbal consent was obtained to inject the  Left   Shoulder  Timeout was completed to confirm the injection site is a subacromial space of the  left  shoulder  Medication used Depo-Medrol 40 mg and lidocaine 1% 3 cc  Anesthesia was provided by ethyl chloride  The injection was performed in the left  posterior subacromial space. After pinning the skin with alcohol and anesthetized the skin with ethyl chloride the subacromial space was injected using a 20-gauge needle. There were no complications  Sterile dressing was applied.  Encounter Diagnoses  Name Primary?   Acute pain of left shoulder Yes   Bursitis of left shoulder

## 2021-03-13 NOTE — Patient Instructions (Signed)
You have received an injection of steroids into the joint. 15% of patients will have increased pain within the 24 hours postinjection.   This is transient and will go away.   We recommend that you use ice packs on the injection site for 20 minutes every 2 hours and extra strength Tylenol 2 tablets every 8 as needed until the pain resolves.  If you continue to have pain after taking the Tylenol and using the ice please call the office for further instructions.  

## 2021-03-14 DIAGNOSIS — D631 Anemia in chronic kidney disease: Secondary | ICD-10-CM | POA: Diagnosis not present

## 2021-03-14 DIAGNOSIS — D509 Iron deficiency anemia, unspecified: Secondary | ICD-10-CM | POA: Diagnosis not present

## 2021-03-14 DIAGNOSIS — Z992 Dependence on renal dialysis: Secondary | ICD-10-CM | POA: Diagnosis not present

## 2021-03-14 DIAGNOSIS — N186 End stage renal disease: Secondary | ICD-10-CM | POA: Diagnosis not present

## 2021-03-14 DIAGNOSIS — N2581 Secondary hyperparathyroidism of renal origin: Secondary | ICD-10-CM | POA: Diagnosis not present

## 2021-03-17 ENCOUNTER — Encounter (HOSPITAL_COMMUNITY): Payer: Self-pay | Admitting: Hematology

## 2021-03-17 DIAGNOSIS — D631 Anemia in chronic kidney disease: Secondary | ICD-10-CM | POA: Diagnosis not present

## 2021-03-17 DIAGNOSIS — Z992 Dependence on renal dialysis: Secondary | ICD-10-CM | POA: Diagnosis not present

## 2021-03-17 DIAGNOSIS — N186 End stage renal disease: Secondary | ICD-10-CM | POA: Diagnosis not present

## 2021-03-17 DIAGNOSIS — N2581 Secondary hyperparathyroidism of renal origin: Secondary | ICD-10-CM | POA: Diagnosis not present

## 2021-03-17 DIAGNOSIS — D509 Iron deficiency anemia, unspecified: Secondary | ICD-10-CM | POA: Diagnosis not present

## 2021-03-18 ENCOUNTER — Ambulatory Visit: Payer: 59 | Admitting: Podiatry

## 2021-03-19 DIAGNOSIS — Z992 Dependence on renal dialysis: Secondary | ICD-10-CM | POA: Diagnosis not present

## 2021-03-19 DIAGNOSIS — D509 Iron deficiency anemia, unspecified: Secondary | ICD-10-CM | POA: Diagnosis not present

## 2021-03-19 DIAGNOSIS — N186 End stage renal disease: Secondary | ICD-10-CM | POA: Diagnosis not present

## 2021-03-19 DIAGNOSIS — D631 Anemia in chronic kidney disease: Secondary | ICD-10-CM | POA: Diagnosis not present

## 2021-03-19 DIAGNOSIS — N2581 Secondary hyperparathyroidism of renal origin: Secondary | ICD-10-CM | POA: Diagnosis not present

## 2021-03-20 ENCOUNTER — Encounter (HOSPITAL_COMMUNITY): Payer: Self-pay | Admitting: Hematology

## 2021-03-20 ENCOUNTER — Ambulatory Visit (HOSPITAL_COMMUNITY): Payer: Medicare Other

## 2021-03-21 DIAGNOSIS — D631 Anemia in chronic kidney disease: Secondary | ICD-10-CM | POA: Diagnosis not present

## 2021-03-21 DIAGNOSIS — D509 Iron deficiency anemia, unspecified: Secondary | ICD-10-CM | POA: Diagnosis not present

## 2021-03-21 DIAGNOSIS — Z992 Dependence on renal dialysis: Secondary | ICD-10-CM | POA: Diagnosis not present

## 2021-03-21 DIAGNOSIS — N2581 Secondary hyperparathyroidism of renal origin: Secondary | ICD-10-CM | POA: Diagnosis not present

## 2021-03-21 DIAGNOSIS — N186 End stage renal disease: Secondary | ICD-10-CM | POA: Diagnosis not present

## 2021-03-22 DIAGNOSIS — N186 End stage renal disease: Secondary | ICD-10-CM | POA: Diagnosis not present

## 2021-03-22 DIAGNOSIS — Z992 Dependence on renal dialysis: Secondary | ICD-10-CM | POA: Diagnosis not present

## 2021-03-24 DIAGNOSIS — D509 Iron deficiency anemia, unspecified: Secondary | ICD-10-CM | POA: Diagnosis not present

## 2021-03-24 DIAGNOSIS — Z992 Dependence on renal dialysis: Secondary | ICD-10-CM | POA: Diagnosis not present

## 2021-03-24 DIAGNOSIS — N2581 Secondary hyperparathyroidism of renal origin: Secondary | ICD-10-CM | POA: Diagnosis not present

## 2021-03-24 DIAGNOSIS — D631 Anemia in chronic kidney disease: Secondary | ICD-10-CM | POA: Diagnosis not present

## 2021-03-24 DIAGNOSIS — N186 End stage renal disease: Secondary | ICD-10-CM | POA: Diagnosis not present

## 2021-03-26 DIAGNOSIS — Z992 Dependence on renal dialysis: Secondary | ICD-10-CM | POA: Diagnosis not present

## 2021-03-26 DIAGNOSIS — N2581 Secondary hyperparathyroidism of renal origin: Secondary | ICD-10-CM | POA: Diagnosis not present

## 2021-03-26 DIAGNOSIS — D509 Iron deficiency anemia, unspecified: Secondary | ICD-10-CM | POA: Diagnosis not present

## 2021-03-26 DIAGNOSIS — N186 End stage renal disease: Secondary | ICD-10-CM | POA: Diagnosis not present

## 2021-03-26 DIAGNOSIS — D631 Anemia in chronic kidney disease: Secondary | ICD-10-CM | POA: Diagnosis not present

## 2021-03-27 ENCOUNTER — Telehealth: Payer: Self-pay

## 2021-03-27 NOTE — Telephone Encounter (Signed)
Physician supplemental form   Copied Noted Sleeved  To call Graciella Belton # [phone] at [facility] home health to pick up or fax [phone]

## 2021-03-27 NOTE — Progress Notes (Deleted)
Cardiology Office Note  Date: 03/27/2021   ID: Kathryn Beck, DOB 20-Nov-1955, MRN [id number]  PCP:  Fayrene Helper, MD  Cardiologist:  Rozann Lesches, MD Electrophysiologist:  None   No chief complaint on file.   History of Present Illness: Kathryn Beck is a 66 y.o. female last seen in November 2022 by Mr. Leonides Sake NP.  Past Medical History:  Diagnosis Date   Acid reflux    Anemia    Arthritis    Axillary masses    Soft tissue - status post excision   Back pain    COVID-19 virus infection 04/06/2019   Depression    End-stage renal disease (HCC)    [facility]  in [place] hypertension    History of blood transfusion    History of cardiac catheterization    Normal coronary arteries October 2020   History of claustrophobia    History of pneumonia 2019   Hypoxia 04/03/2019   Mixed hyperlipidemia    Obesity    Pancreatitis    Peritoneal dialysis catheter in place [facility])    Pneumonia due to COVID-19 virus 04/02/2019   Sleep apnea    Noncompliant with CPAP   Type 2 diabetes mellitus ([place])     Past Surgical History:  Procedure Laterality Date   ABDOMINAL HYSTERECTOMY     AV FISTULA PLACEMENT Left 09/02/2017   Procedure: creation of left arm ARTERIOVENOUS (AV) FISTULA;  Surgeon: Serafina Mitchell, MD;  Location: [place];  Service: Vascular;  Laterality: Left;   COLONOSCOPY  2008   Dr. Oneida Alar: normal    COLONOSCOPY N/A 12/18/2016   Dr. Oneida Alar: multiple tubular adenomas, internal hemorrhoids. Surveillance in 3 years    ESOPHAGEAL DILATION N/A 10/13/2015   Procedure: ESOPHAGEAL DILATION;  Surgeon: Rogene Houston, MD;  [facility];  Service: Endoscopy;  Laterality: N/A;   ESOPHAGOGASTRODUODENOSCOPY N/A 10/13/2015   Dr. Laural Golden: chronic gastritis on path, no H.pylori. Empiric dilation    ESOPHAGOGASTRODUODENOSCOPY N/A 12/18/2016   Dr. Oneida Alar: mild gastritis. BRAVO study revealed uncontrolled GERD. Dysphagia secondary to uncontrolled reflux   FOOT  SURGERY Bilateral    "nerve"     LEFT HEART CATH AND CORONARY ANGIOGRAPHY N/A 12/29/2018   Procedure: LEFT HEART CATH AND CORONARY ANGIOGRAPHY;  Surgeon: Jettie Booze, MD;  Location: [place] [facility];  Service: Cardiovascular;  Laterality: N/A;   LUNG BIOPSY     MASS EXCISION Right 01/09/2013   Procedure: EXCISION OF NEOPLASM OF RIGHT  AXILLA  AND EXCISION OF NEOPLASM OF LEFT AXILLA;  Surgeon: Jamesetta So, MD;  Location: [facility];  Service: General;  Laterality: Right;  procedure end @ 08:23   MYRINGOTOMY WITH TUBE PLACEMENT Bilateral 04/28/2017   Procedure: BILATERAL MYRINGOTOMY WITH TUBE PLACEMENT;  Surgeon: Leta Baptist, MD;  Location: [place];  Service: ENT;  Laterality: Bilateral;   REVISION OF ARTERIOVENOUS GORETEX GRAFT Left 05/04/2018   Procedure: TRANSPOSITION OF CEPHALIC VEIN ARTERIOVENOUS FISTULA LEFT ARM;  Surgeon: Rosetta Posner, MD;  Location: [place];  Service: Vascular;  Laterality: Left;   SAVORY DILATION N/A 12/18/2016   Procedure: SAVORY DILATION;  Surgeon: Danie Binder, MD;  [facility];  Service: Endoscopy;  Laterality: N/A;    Current Outpatient Medications  Medication Sig Dispense Refill   amLODipine (NORVASC) 5 MG tablet On dialysis days (Monday, Wednesday, Friday) hold the morning dose - all other days may take twice a day as planned     aspirin  81 MG chewable tablet CHEW 2 TABLETS BY MOUTH ONCE DAILY 56 tablet 11   B Complex-C-Folic Acid (RENA-VITE RX) 1 MG TABS Take 1 tablet by mouth daily.     calcitRIOL (ROCALTROL) 0.25 MCG capsule Take 0.25 capsules by mouth.     cloNIDine (CATAPRES) 0.2 MG tablet Take 0.2 mg by mouth 2 (two) times daily.     Continuous Blood Gluc Sensor (FREESTYLE LIBRE 2 SENSOR) MISC 1 each by Does not apply route every 14 (fourteen) days. 6 each 3   cyclobenzaprine (FLEXERIL) 5 MG tablet TAKE ONE TABLET BY MOUTH AT BEDTIME AS NEEDED FOR MUSCLE SPASMS. 30 tablet 0   DULoxetine (CYMBALTA) 60 MG capsule TAKE (1) CAPSULE BY MOUTH ONCE  DAILY. 28 capsule 11   ezetimibe (ZETIA) 10 MG tablet TAKE 1 TABLET BY MOUTH ONCE DAILY. 28 tablet 11   insulin aspart protamine - aspart (NOVOLOG 70/30 FLEXPEN) (70-30) 100 UNIT/ML FlexPen Inject 24-30 Units into the skin 3 (three) times daily before meals. 30 mL 5   Insulin Glargine, 2 Unit Dial, (TOUJEO MAX SOLOSTAR) 300 UNIT/ML SOPN Inject 50-56 Units into the skin See admin instructions. Take after dialysis 3 day a week. 3 pen 11   irbesartan (AVAPRO) 150 MG tablet SMARTSIG:1 Tablet(s) By Mouth Every Evening     isosorbide mononitrate (IMDUR) 30 MG 24 hr tablet TAKE 1 TABLET BY MOUTH ONCE DAILY. 28 tablet 11   levalbuterol (XOPENEX) 1.25 MG/0.5ML nebulizer solution Take 1.25 mg by nebulization every 4 (four) hours as needed for wheezing or shortness of breath. 1 each 0   Melatonin 3 MG TBDP Take one tablet by mouth at bedtime , for sleep 30 tablet 2   metoprolol succinate (TOPROL-XL) 50 MG 24 hr tablet TAKE (1) TABLET BY MOUTH DAILY WITH FOOD *TAKE AFTER DIALYSIS* 28 tablet 11   midodrine (PROAMATINE) 10 MG tablet TAKE 1 TABLET BY MOUTH TWICE DAILY 56 tablet 5   ondansetron (ZOFRAN) 4 MG tablet Take 1 tablet (4 mg total) by mouth every 8 (eight) hours as needed for nausea or vomiting. 20 tablet 0   phenol (CHLORASEPTIC) 1.4 % LIQD Use as directed 1 spray in the mouth or throat as needed for throat irritation / pain. 177 mL 0   pregabalin (LYRICA) 50 MG capsule TAKE (1) CAPSULE BY MOUTH THREE TIMES DAILY 84 capsule 0   sertraline (ZOLOFT) 100 MG tablet TAKE 1 TABLET BY MOUTH EVERY DAY 90 tablet 2   torsemide (DEMADEX) 100 MG tablet Take 1 tablet (100 mg total) by mouth 2 (two) times daily. On dialysis day (Monday, Wednesday, Friday) hold the morning dose - may take evening dose as planned.  All other days, may take twice a day as planned.     triamcinolone cream (KENALOG) 0.1 % APPLY TO AFFECTED AREA TWICE A DAY 30 g 0   ULTICARE MINI PEN NEEDLES 31G X 6 MM MISC USE AS DIRECTED 100 each 0    [name] 500 MG chewable tablet Chew 500 mg by mouth See admin instructions. Take 500 mg with each snack and meal     zolpidem (AMBIEN) 5 MG tablet TAKE 1 TABLET BY MOUTH AT BEDTIME AS NEEDED. 30 tablet 0   No current facility-administered medications for this visit.   Facility-Administered Medications Ordered in Other Visits  Medication Dose Route Frequency Provider Last Rate Last Admin   0.9 %  sodium chloride infusion   Intravenous Continuous Lockamy, Randi L, NP-C 20 mL/hr at 07/19/19 1641 Rate Verify at 07/19/19  1641   Allergies:  Ace inhibitors, Penicillins, Statins, and Albuterol   Social History: The patient  reports that she has never smoked. She has never used smokeless tobacco. She reports that she does not drink alcohol and does not use drugs.   Family History: The patient's family history includes Arthritis in her father; Breast cancer in her sister; Hypercholesterolemia in her father and sister; Hypertension in her father, sister, and sister.   ROS:  Please see the history of present illness. Otherwise, complete review of systems is positive for {NONE DEFAULTED:18576}.  All other systems are reviewed and negative.   Physical Exam: VS:  There were no vitals taken for this visit., BMI There is no height or weight on file to calculate BMI.  Wt Readings from Last 3 Encounters:  03/13/21 208 lb 3.2 oz (94.4 kg)  03/06/21 212 lb (96.2 kg)  02/10/21 213 lb 6.4 oz (96.8 kg)    General: Patient appears comfortable at rest. HEENT: Conjunctiva and lids normal, oropharynx clear with moist mucosa. Neck: Supple, no elevated JVP or carotid bruits, no thyromegaly. Lungs: Clear to auscultation, nonlabored breathing at rest. Cardiac: Regular rate and rhythm, no S3 or significant systolic murmur, no pericardial rub. Abdomen: Soft, nontender, no hepatomegaly, bowel sounds present, no guarding or rebound. Extremities: No pitting edema, distal pulses 2+. Skin: Warm and dry. Musculoskeletal:  No kyphosis. Neuropsychiatric: Alert and oriented x3, affect grossly appropriate.  ECG:  An ECG dated 12/02/2020 was personally reviewed today and demonstrated:  Sinus rhythm.  Recent Labwork: 11/05/2020: Magnesium 2.2 12/02/2020: ALT 37; AST 13; BUN 86; Creatinine, Ser 10.13; Hemoglobin 10.6; Platelets 257; Potassium 4.6; Sodium 138     Component Value Date/Time   CHOL 190 11/21/2020 1402   TRIG 259 (H) 12/02/2020 1754   HDL 43.10 11/21/2020 1402   CHOLHDL 4 11/21/2020 1402   VLDL 54.4 (H) 11/21/2020 1402   LDLCALC 114 (H) 03/08/2018 1234   LDLCALC 109 (H) 11/12/2017 0936   LDLDIRECT 120.0 11/21/2020 1402    Other Studies Reviewed Today:  Cardiac catheterization 12/29/2018: Large, widely patent coronary arteries. LV end diastolic pressure is mildly elevated. There is no aortic valve stenosis.   No significant CAD.  Continue medical therapy.  Myocardial perfusion PET 02/09/2020 ([facility]): FINAL COMMENTS   1- Cardiac PET/CT Myocardial Perfusion Imaging Study Demonstrate No Evidence of Ischemia or Infarct.   2- Gated Cardiac PET/CT Functional Study Demonstrate Normal Left Ventricular Ejection Fraction at Rest and Stress as Well as   Normal Left Ventricular Volumes.   3- Gated Cardiac PET/CT Functional Study Demonstrate Normal Left Ventricular Function and Wall Thickening.   4- No Evidence of Increased Tracer Uptake in The Right Ventricular Free Wall.   Echocardiogram 11/01/2020 ([facility]): INTERPRETATION ---------------------------------------------------------------    NORMAL LEFT VENTRICULAR SYSTOLIC FUNCTION WITH MILD LVH    ELEVATED LA PRESSURES WITH DIASTOLIC DYSFUNCTION    NORMAL RIGHT VENTRICULAR SYSTOLIC FUNCTION    VALVULAR REGURGITATION: TRIVIAL AR, TRIVIAL MR, TRIVIAL TR    NO VALVULAR STENOSIS    AORTIC SCLEROSIS    NO PRIOR STUDY FOR COMPARISON   CPX 11/01/2020 ([facility]): Conclusions:    1. The data suggests a maximal exercise test, so maximal cardiorespiratory     capacity could be determined.     2. Based on the peak oxygen consumption (VO2) achieved, functional capacity    would be consistent with a moderate functional impairment, with peak VO2    8.3 ml/kg/min (53% of predicted normals). Normative peak predicted VO2  values are corrected for age, gender, and weight.     3. There were maximal signs of a circulatory limitation to exercise with    blunted stroke volume augmentation with exercise.    - The O2 pulse (an indicator of stroke volume) reached a plateau at an    approximate HR of 100 bpm.    - Oxygen uptake efficiency slope was 1054 mL/min (58% predicted)    - Severe systolic hypertension throughout study    - ECG was NSR at rest and showed no ST segment changes or ectopy.     4. Aerobic reserve was low due to physical deconditioning limiting exercise    capacity.     5. Ventilatory reserve limits were not approached at peak exercise. Resting    spirometry suggest a restrictive pulmonary physiology. The MVV is normal.     6. No prior CPET available for comparison.   Assessment and Plan:    Medication Adjustments/Labs and Tests Ordered: Current medicines are reviewed at length with the patient today.  Concerns regarding medicines are outlined above.   Tests Ordered: No orders of the defined types were placed in this encounter.   Medication Changes: No orders of the defined types were placed in this encounter.   Disposition:  Follow up {follow up:15908}  Signed, Satira Sark, MD, [facility] 03/27/2021 10:18 AM    [facility] [place], [place], [place] [address] Phone: [phone]; Fax: [phone]

## 2021-03-28 ENCOUNTER — Encounter: Payer: Self-pay | Admitting: Cardiology

## 2021-03-28 ENCOUNTER — Ambulatory Visit: Payer: Medicare Other | Admitting: Cardiology

## 2021-03-28 DIAGNOSIS — N186 End stage renal disease: Secondary | ICD-10-CM | POA: Diagnosis not present

## 2021-03-28 DIAGNOSIS — I10 Essential (primary) hypertension: Secondary | ICD-10-CM

## 2021-03-28 DIAGNOSIS — D509 Iron deficiency anemia, unspecified: Secondary | ICD-10-CM | POA: Diagnosis not present

## 2021-03-28 DIAGNOSIS — Z992 Dependence on renal dialysis: Secondary | ICD-10-CM | POA: Diagnosis not present

## 2021-03-28 DIAGNOSIS — D631 Anemia in chronic kidney disease: Secondary | ICD-10-CM | POA: Diagnosis not present

## 2021-03-28 DIAGNOSIS — N2581 Secondary hyperparathyroidism of renal origin: Secondary | ICD-10-CM | POA: Diagnosis not present

## 2021-03-31 DIAGNOSIS — Z8616 Personal history of COVID-19: Secondary | ICD-10-CM | POA: Diagnosis not present

## 2021-03-31 DIAGNOSIS — D631 Anemia in chronic kidney disease: Secondary | ICD-10-CM | POA: Diagnosis not present

## 2021-03-31 DIAGNOSIS — D509 Iron deficiency anemia, unspecified: Secondary | ICD-10-CM | POA: Diagnosis not present

## 2021-03-31 DIAGNOSIS — G629 Polyneuropathy, unspecified: Secondary | ICD-10-CM | POA: Diagnosis not present

## 2021-03-31 DIAGNOSIS — Z8701 Personal history of pneumonia (recurrent): Secondary | ICD-10-CM | POA: Diagnosis not present

## 2021-03-31 DIAGNOSIS — Z7982 Long term (current) use of aspirin: Secondary | ICD-10-CM | POA: Diagnosis not present

## 2021-03-31 DIAGNOSIS — I120 Hypertensive chronic kidney disease with stage 5 chronic kidney disease or end stage renal disease: Secondary | ICD-10-CM | POA: Diagnosis not present

## 2021-03-31 DIAGNOSIS — Z8673 Personal history of transient ischemic attack (TIA), and cerebral infarction without residual deficits: Secondary | ICD-10-CM | POA: Diagnosis not present

## 2021-03-31 DIAGNOSIS — N2581 Secondary hyperparathyroidism of renal origin: Secondary | ICD-10-CM | POA: Diagnosis not present

## 2021-03-31 DIAGNOSIS — E119 Type 2 diabetes mellitus without complications: Secondary | ICD-10-CM | POA: Diagnosis not present

## 2021-03-31 DIAGNOSIS — Z9181 History of falling: Secondary | ICD-10-CM | POA: Diagnosis not present

## 2021-03-31 DIAGNOSIS — Z794 Long term (current) use of insulin: Secondary | ICD-10-CM | POA: Diagnosis not present

## 2021-03-31 DIAGNOSIS — E1122 Type 2 diabetes mellitus with diabetic chronic kidney disease: Secondary | ICD-10-CM | POA: Diagnosis not present

## 2021-03-31 DIAGNOSIS — N186 End stage renal disease: Secondary | ICD-10-CM | POA: Diagnosis not present

## 2021-03-31 DIAGNOSIS — Z992 Dependence on renal dialysis: Secondary | ICD-10-CM | POA: Diagnosis not present

## 2021-04-01 ENCOUNTER — Other Ambulatory Visit: Payer: Self-pay | Admitting: Orthopedic Surgery

## 2021-04-01 DIAGNOSIS — M545 Low back pain, unspecified: Secondary | ICD-10-CM

## 2021-04-02 DIAGNOSIS — N186 End stage renal disease: Secondary | ICD-10-CM | POA: Diagnosis not present

## 2021-04-02 DIAGNOSIS — D631 Anemia in chronic kidney disease: Secondary | ICD-10-CM | POA: Diagnosis not present

## 2021-04-02 DIAGNOSIS — D509 Iron deficiency anemia, unspecified: Secondary | ICD-10-CM | POA: Diagnosis not present

## 2021-04-02 DIAGNOSIS — N2581 Secondary hyperparathyroidism of renal origin: Secondary | ICD-10-CM | POA: Diagnosis not present

## 2021-04-02 DIAGNOSIS — Z992 Dependence on renal dialysis: Secondary | ICD-10-CM | POA: Diagnosis not present

## 2021-04-04 DIAGNOSIS — N186 End stage renal disease: Secondary | ICD-10-CM | POA: Diagnosis not present

## 2021-04-04 DIAGNOSIS — D631 Anemia in chronic kidney disease: Secondary | ICD-10-CM | POA: Diagnosis not present

## 2021-04-04 DIAGNOSIS — Z992 Dependence on renal dialysis: Secondary | ICD-10-CM | POA: Diagnosis not present

## 2021-04-04 DIAGNOSIS — D509 Iron deficiency anemia, unspecified: Secondary | ICD-10-CM | POA: Diagnosis not present

## 2021-04-04 DIAGNOSIS — N2581 Secondary hyperparathyroidism of renal origin: Secondary | ICD-10-CM | POA: Diagnosis not present

## 2021-04-07 DIAGNOSIS — D509 Iron deficiency anemia, unspecified: Secondary | ICD-10-CM | POA: Diagnosis not present

## 2021-04-07 DIAGNOSIS — N2581 Secondary hyperparathyroidism of renal origin: Secondary | ICD-10-CM | POA: Diagnosis not present

## 2021-04-07 DIAGNOSIS — D631 Anemia in chronic kidney disease: Secondary | ICD-10-CM | POA: Diagnosis not present

## 2021-04-07 DIAGNOSIS — N186 End stage renal disease: Secondary | ICD-10-CM | POA: Diagnosis not present

## 2021-04-07 DIAGNOSIS — Z992 Dependence on renal dialysis: Secondary | ICD-10-CM | POA: Diagnosis not present

## 2021-04-07 NOTE — Telephone Encounter (Signed)
Called Kathryn Beck [phone] (mobile) lvm that forms are ready to be picked up.

## 2021-04-09 DIAGNOSIS — N2581 Secondary hyperparathyroidism of renal origin: Secondary | ICD-10-CM | POA: Diagnosis not present

## 2021-04-09 DIAGNOSIS — Z992 Dependence on renal dialysis: Secondary | ICD-10-CM | POA: Diagnosis not present

## 2021-04-09 DIAGNOSIS — D631 Anemia in chronic kidney disease: Secondary | ICD-10-CM | POA: Diagnosis not present

## 2021-04-09 DIAGNOSIS — N186 End stage renal disease: Secondary | ICD-10-CM | POA: Diagnosis not present

## 2021-04-09 DIAGNOSIS — D509 Iron deficiency anemia, unspecified: Secondary | ICD-10-CM | POA: Diagnosis not present

## 2021-04-11 DIAGNOSIS — D631 Anemia in chronic kidney disease: Secondary | ICD-10-CM | POA: Diagnosis not present

## 2021-04-11 DIAGNOSIS — Z992 Dependence on renal dialysis: Secondary | ICD-10-CM | POA: Diagnosis not present

## 2021-04-11 DIAGNOSIS — N186 End stage renal disease: Secondary | ICD-10-CM | POA: Diagnosis not present

## 2021-04-11 DIAGNOSIS — D509 Iron deficiency anemia, unspecified: Secondary | ICD-10-CM | POA: Diagnosis not present

## 2021-04-11 DIAGNOSIS — N2581 Secondary hyperparathyroidism of renal origin: Secondary | ICD-10-CM | POA: Diagnosis not present

## 2021-04-14 DIAGNOSIS — N2581 Secondary hyperparathyroidism of renal origin: Secondary | ICD-10-CM | POA: Diagnosis not present

## 2021-04-14 DIAGNOSIS — Z992 Dependence on renal dialysis: Secondary | ICD-10-CM | POA: Diagnosis not present

## 2021-04-14 DIAGNOSIS — N186 End stage renal disease: Secondary | ICD-10-CM | POA: Diagnosis not present

## 2021-04-14 DIAGNOSIS — D509 Iron deficiency anemia, unspecified: Secondary | ICD-10-CM | POA: Diagnosis not present

## 2021-04-14 DIAGNOSIS — D631 Anemia in chronic kidney disease: Secondary | ICD-10-CM | POA: Diagnosis not present

## 2021-04-16 DIAGNOSIS — N2581 Secondary hyperparathyroidism of renal origin: Secondary | ICD-10-CM | POA: Diagnosis not present

## 2021-04-16 DIAGNOSIS — D509 Iron deficiency anemia, unspecified: Secondary | ICD-10-CM | POA: Diagnosis not present

## 2021-04-16 DIAGNOSIS — Z992 Dependence on renal dialysis: Secondary | ICD-10-CM | POA: Diagnosis not present

## 2021-04-16 DIAGNOSIS — D631 Anemia in chronic kidney disease: Secondary | ICD-10-CM | POA: Diagnosis not present

## 2021-04-16 DIAGNOSIS — N186 End stage renal disease: Secondary | ICD-10-CM | POA: Diagnosis not present

## 2021-04-17 ENCOUNTER — Ambulatory Visit: Payer: Medicare Other | Admitting: Podiatry

## 2021-04-18 DIAGNOSIS — N2581 Secondary hyperparathyroidism of renal origin: Secondary | ICD-10-CM | POA: Diagnosis not present

## 2021-04-18 DIAGNOSIS — Z992 Dependence on renal dialysis: Secondary | ICD-10-CM | POA: Diagnosis not present

## 2021-04-18 DIAGNOSIS — D631 Anemia in chronic kidney disease: Secondary | ICD-10-CM | POA: Diagnosis not present

## 2021-04-18 DIAGNOSIS — N186 End stage renal disease: Secondary | ICD-10-CM | POA: Diagnosis not present

## 2021-04-18 DIAGNOSIS — D509 Iron deficiency anemia, unspecified: Secondary | ICD-10-CM | POA: Diagnosis not present

## 2021-04-21 DIAGNOSIS — Z992 Dependence on renal dialysis: Secondary | ICD-10-CM | POA: Diagnosis not present

## 2021-04-21 DIAGNOSIS — N186 End stage renal disease: Secondary | ICD-10-CM | POA: Diagnosis not present

## 2021-04-21 DIAGNOSIS — D509 Iron deficiency anemia, unspecified: Secondary | ICD-10-CM | POA: Diagnosis not present

## 2021-04-21 DIAGNOSIS — N2581 Secondary hyperparathyroidism of renal origin: Secondary | ICD-10-CM | POA: Diagnosis not present

## 2021-04-21 DIAGNOSIS — D631 Anemia in chronic kidney disease: Secondary | ICD-10-CM | POA: Diagnosis not present

## 2021-04-22 DIAGNOSIS — Z992 Dependence on renal dialysis: Secondary | ICD-10-CM | POA: Diagnosis not present

## 2021-04-22 DIAGNOSIS — N186 End stage renal disease: Secondary | ICD-10-CM | POA: Diagnosis not present

## 2021-04-23 DIAGNOSIS — N186 End stage renal disease: Secondary | ICD-10-CM | POA: Diagnosis not present

## 2021-04-23 DIAGNOSIS — N2581 Secondary hyperparathyroidism of renal origin: Secondary | ICD-10-CM | POA: Diagnosis not present

## 2021-04-23 DIAGNOSIS — D631 Anemia in chronic kidney disease: Secondary | ICD-10-CM | POA: Diagnosis not present

## 2021-04-23 DIAGNOSIS — D509 Iron deficiency anemia, unspecified: Secondary | ICD-10-CM | POA: Diagnosis not present

## 2021-04-23 DIAGNOSIS — Z992 Dependence on renal dialysis: Secondary | ICD-10-CM | POA: Diagnosis not present

## 2021-04-25 DIAGNOSIS — N186 End stage renal disease: Secondary | ICD-10-CM | POA: Diagnosis not present

## 2021-04-25 DIAGNOSIS — D631 Anemia in chronic kidney disease: Secondary | ICD-10-CM | POA: Diagnosis not present

## 2021-04-25 DIAGNOSIS — Z992 Dependence on renal dialysis: Secondary | ICD-10-CM | POA: Diagnosis not present

## 2021-04-25 DIAGNOSIS — D509 Iron deficiency anemia, unspecified: Secondary | ICD-10-CM | POA: Diagnosis not present

## 2021-04-25 DIAGNOSIS — N2581 Secondary hyperparathyroidism of renal origin: Secondary | ICD-10-CM | POA: Diagnosis not present

## 2021-04-28 DIAGNOSIS — N2581 Secondary hyperparathyroidism of renal origin: Secondary | ICD-10-CM | POA: Diagnosis not present

## 2021-04-28 DIAGNOSIS — D509 Iron deficiency anemia, unspecified: Secondary | ICD-10-CM | POA: Diagnosis not present

## 2021-04-28 DIAGNOSIS — N186 End stage renal disease: Secondary | ICD-10-CM | POA: Diagnosis not present

## 2021-04-28 DIAGNOSIS — D631 Anemia in chronic kidney disease: Secondary | ICD-10-CM | POA: Diagnosis not present

## 2021-04-28 DIAGNOSIS — Z992 Dependence on renal dialysis: Secondary | ICD-10-CM | POA: Diagnosis not present

## 2021-04-30 DIAGNOSIS — N2581 Secondary hyperparathyroidism of renal origin: Secondary | ICD-10-CM | POA: Diagnosis not present

## 2021-04-30 DIAGNOSIS — D631 Anemia in chronic kidney disease: Secondary | ICD-10-CM | POA: Diagnosis not present

## 2021-04-30 DIAGNOSIS — N186 End stage renal disease: Secondary | ICD-10-CM | POA: Diagnosis not present

## 2021-04-30 DIAGNOSIS — Z992 Dependence on renal dialysis: Secondary | ICD-10-CM | POA: Diagnosis not present

## 2021-04-30 DIAGNOSIS — D509 Iron deficiency anemia, unspecified: Secondary | ICD-10-CM | POA: Diagnosis not present

## 2021-05-01 ENCOUNTER — Other Ambulatory Visit: Payer: Self-pay | Admitting: Orthopedic Surgery

## 2021-05-01 DIAGNOSIS — M545 Low back pain, unspecified: Secondary | ICD-10-CM

## 2021-05-02 ENCOUNTER — Other Ambulatory Visit: Payer: Self-pay

## 2021-05-02 ENCOUNTER — Encounter: Payer: Self-pay | Admitting: Internal Medicine

## 2021-05-02 ENCOUNTER — Ambulatory Visit (INDEPENDENT_AMBULATORY_CARE_PROVIDER_SITE_OTHER): Payer: Medicare Other | Admitting: Internal Medicine

## 2021-05-02 VITALS — BP 128/72 | HR 86 | Ht 64.0 in | Wt 205.8 lb

## 2021-05-02 DIAGNOSIS — E1122 Type 2 diabetes mellitus with diabetic chronic kidney disease: Secondary | ICD-10-CM | POA: Diagnosis not present

## 2021-05-02 DIAGNOSIS — Z992 Dependence on renal dialysis: Secondary | ICD-10-CM | POA: Diagnosis not present

## 2021-05-02 DIAGNOSIS — N2581 Secondary hyperparathyroidism of renal origin: Secondary | ICD-10-CM | POA: Diagnosis not present

## 2021-05-02 DIAGNOSIS — E782 Mixed hyperlipidemia: Secondary | ICD-10-CM

## 2021-05-02 DIAGNOSIS — D631 Anemia in chronic kidney disease: Secondary | ICD-10-CM | POA: Diagnosis not present

## 2021-05-02 DIAGNOSIS — D3500 Benign neoplasm of unspecified adrenal gland: Secondary | ICD-10-CM | POA: Diagnosis not present

## 2021-05-02 DIAGNOSIS — N186 End stage renal disease: Secondary | ICD-10-CM | POA: Diagnosis not present

## 2021-05-02 DIAGNOSIS — D509 Iron deficiency anemia, unspecified: Secondary | ICD-10-CM | POA: Diagnosis not present

## 2021-05-02 LAB — POCT GLYCOSYLATED HEMOGLOBIN (HGB A1C): Hemoglobin A1C: 11.4 % — AB (ref 4.0–5.6)

## 2021-05-02 MED ORDER — INSULIN PEN NEEDLE 32G X 4 MM MISC: 3 refills | Status: DC

## 2021-05-02 MED ORDER — HUMALOG KWIKPEN 200 UNIT/ML ~~LOC~~ SOPN: 18.0000 [IU] | PEN_INJECTOR | Freq: Every day | SUBCUTANEOUS | 1 refills | Status: DC

## 2021-05-02 MED ORDER — TOUJEO MAX SOLOSTAR 300 UNIT/ML ~~LOC~~ SOPN: 50.0000 [IU] | PEN_INJECTOR | SUBCUTANEOUS | 1 refills | Status: DC

## 2021-05-02 NOTE — Patient Instructions (Addendum)
Please stop 70/30 insulin and start: - Toujeo 70 units in the evening - Humalog : 20 units before a smaller meal 24 units before a regular meal 28 units before a larger meal Take Humalog EVERY TIME you eat, 15 min before the meal, if sugars are >70.  Please return in 1.5 months.

## 2021-05-02 NOTE — Progress Notes (Signed)
Patient ID: Kathryn Beck, female   DOB: 1956-03-17, 66 y.o.   MRN: [id number]  This visit occurred during the SARS-CoV-2 public health emergency.  Safety protocols were in place, including screening questions prior to the visit, additional usage of staff PPE, and extensive cleaning of exam room while observing appropriate contact time as indicated for disinfecting solutions.   HPI: Kathryn Beck is a 66 y.o.-year-old female, initially referred by her PCP, Dr. Moshe Cipro, returning for follow-up for of DM2, dx in 2008, insulin-dependent since ~2015, uncontrolled, with complications (ESRD, PN). She saw Dr. Dorris Fetch - last OV with him 05/2016.  Last visit 5 months ago.  She is here with her daughter who offers part of the history especially related to medication doses, blood sugars, and other medical history.  Interim history: She was previously on peritoneal dialysis but changed to hemodialysis.  She was previously on the kidney transplant but not anymore and she needs better control of hypertension and diabetes. No increased urination, blurry vision, nausea, chest pain. She had cataract surgery.   She was without insulin for 2 months after insurance changed their coverage (she did not let me know) >> only restarted insulin 2 weeks ago (premixed). She just got the [employer] 2 CGM  - did not start it yet.  Adrenal masses.  Reviewed previous imaging reports: 04/22/2018: MRI abd.: Stable 19 mm nodule in the left adrenal gland, unchanged since 2015, 19 mm otherwise negative.  05/28/2017: MRI abd: Bilateral adrenal nodules with signal dropout on out of phase imaging, consistent with adenomas.   Example at 2.5 cm on the left and on the order of 1.6 cm on the right.  10/10/2015: CT abdomen and pelvis without contrast: Stable 2.2 cm left adrenal nodule with Hounsfield unit measurements 28 unchanged likely a lipid poor adenoma  08/23/2008: MRI of the abdomen with and without contrast: Left adrenal mass  measuring 1.6 x 1.5 cm (previously measuring 1.4 x 1.6 cm on the CT scan from 2003 -stable in size),  Of note, she has an enlarging pancreatic head mass - pt reports a h/o pancreatitis. (This will need to be eval. By GI)  Reviewed previous adrenal biochemical investigation: Dexamethasone suppression test showed a low normal, nonsuppressed cortisol, but I suspect that this is due to her end-stage renal disease and dialysis status, rather than increased production. Component     Latest Ref Rng & Units 08/31/2018  Cortisol - AM     mcg/dL 6.1  Dexamethasone, Serum     ng/dL 446   A 24-hour urine cortisol could not be done as she is on dialysis.  We checked a late-night salivary cortisol as this was elevated: Component     Latest Ref Rng & Units 12/26/2020 Draw date/time: 12/22/20  midnight   Salivary Cortisol Baseline     ug/dL 0.637  Salivary Cortisol 2nd Specimen     ug/dL 0.634  Reference Range:  Children and Adults:  8:00a.m.:   0.025 - 0.600  Noon:      <0.010 - 0.330  4:00p.m.:   0.010 - 0.200  Midnight:  <0.010 - 0.090   Previous investigation was negative for hyperaldosteronism or pheochromocytoma: Component     Latest Ref Rng & Units 11/30/2017 10/04/2018 11/21/2020  Epinephrine     pg/mL Undetectable 24 <20  Norepinephrine     pg/mL 99 167 (L) 332  Dopamine     pg/mL Undetectable <10 <10  Catecholamines, Total     pg/mL 99  Total Catecholamines     pg/mL  191 (L) 332  Metanephrine, Pl     <=57 pg/mL <25 <25 <25  Normetanephrine, Pl     <=148 pg/mL 71 38 56  Total Metanephrines-Plasma     <=205 pg/mL 71 38 56  ALDOSTERONE      ng/dL 3 2 <1  Renin Activity     0.25 - 5.82 ng/mL/h 1.20 1.48 0.28  ALDO / PRA Ratio      2.5 1.4 see note  Potassium     3.5 - 5.1 mEq/L 4.0 4.9 4.1    DM2: Reviewed HbA1c levels: 11/01/2020: HbA1c 9.3% Lab Results  Component Value Date   HGBA1C 9.1 (A) 10/28/2020   HGBA1C 8.4 (A) 06/18/2020   HGBA1C 7.2 (A) 02/28/2020   02/09/2020: HbA1c 6.8%  In the past, pt. was on a regimen of: - Humalog 75/25 20-26 units before each meal, 2-3X a day depending on the CBG before the meal  - Levemir 100 units at bedtime  At last OV, I recommended: - Toujeo 54 >>  was only taking 34 units in the evening - now out - Humalog >> now on NovoLog 70/30 24-30 units 3-4x a day (!!!????) 24 units before a smaller meal 26-28 units before a regular meal 30 units before a larger meal Take Humalog EVERY TIME you eat, 15 min before the meal, if sugars are >70. - Humalog sliding scale: - 150-175: + 1 unit  - 176-200: + 2 units  - 201-225: + 3 units  - 226-250: + 4 units  - 251-275: + 5 units - 276-300: + 6 units  Try not to correct sugars at bedtime unless they are >300 and in that case, take no more than 4 units.  Pt was checking her sugars 4 times a day  with the freestyle libre CGM - not attached yet: - am: 240-300s - 2h after b'fast: n/c - lunch: 240s - 2h after lunch: n/c - dinner: 200-300s - 2h after dinner: n/c - bedtime: 300-HI  Previously:   Lowest sugar was 31 ... >> 51 at waking up >> 42; it is unclear at which level she has hypoglycemia awareness. Highest sugar was 241 >> 500, then 300s >> HI.  Glucometer: Accuchek  Pt's meals are: - Breakfast: eggs, bacon, oatmeal, grits - Lunch: salads - Dinner: meat + veggie or sandwich + salad or nabs - Snacks: nabs, fruit cups, jello  -+ ESRD, on dialysis: Lab Results  Component Value Date   BUN 86 (H) 12/02/2020   BUN 54 (H) 11/05/2020   CREATININE 10.13 (H) 12/02/2020   CREATININE 7.69 (H) 11/05/2020  Off olmesartan.  On irbesartan now.  -+ HL; last set of lipids: Lab Results  Component Value Date   CHOL 190 11/21/2020   HDL 43.10 11/21/2020   LDLCALC 114 (H) 03/08/2018   LDLDIRECT 120.0 11/21/2020   TRIG 259 (H) 12/02/2020   CHOLHDL 4 11/21/2020  Previously on Lipitor 40, now on Zetia due to transaminitis with statins.  - last eye exam was in  07/2020: no DR  -No numbness but she has tingling in her feet.  She is on Neurontin 300 mg 3 times a day  She also has HTN. She has been in the hospital repeatedly in the past for pancreatitis.  ROS: + See HPI  I reviewed pt's medications, allergies, PMH, social hx, family hx, and changes were documented in the history of present illness. Otherwise, unchanged from my initial visit note.  Past Medical History:  Diagnosis Date   Acid reflux    Anemia    Arthritis    Axillary masses    Soft tissue - status post excision   Back pain    COVID-19 virus infection 04/06/2019   Depression    End-stage renal disease (HCC)    [facility]  [place] hypertension    History of blood transfusion    History of cardiac catheterization    Normal coronary arteries October 2020   History of claustrophobia    History of pneumonia 2019   Hypoxia 04/03/2019   Mixed hyperlipidemia    Obesity    Pancreatitis    Peritoneal dialysis catheter in place [facility])    Pneumonia due to COVID-19 virus 04/02/2019   Sleep apnea    Noncompliant with CPAP   Type 2 diabetes mellitus ([place])    Past Surgical History:  Procedure Laterality Date   ABDOMINAL HYSTERECTOMY     AV FISTULA PLACEMENT Left 09/02/2017   Procedure: creation of left arm ARTERIOVENOUS (AV) FISTULA;  Surgeon: Serafina Mitchell, MD;  Location: [place];  Service: Vascular;  Laterality: Left;   COLONOSCOPY  2008   Dr. Oneida Alar: normal    COLONOSCOPY N/A 12/18/2016   Dr. Oneida Alar: multiple tubular adenomas, internal hemorrhoids. Surveillance in 3 years    ESOPHAGEAL DILATION N/A 10/13/2015   Procedure: ESOPHAGEAL DILATION;  Surgeon: Rogene Houston, MD;  [facility];  Service: Endoscopy;  Laterality: N/A;   ESOPHAGOGASTRODUODENOSCOPY N/A 10/13/2015   Dr. Laural Golden: chronic gastritis on path, no H.pylori. Empiric dilation    ESOPHAGOGASTRODUODENOSCOPY N/A 12/18/2016   Dr. Oneida Alar: mild gastritis. BRAVO study revealed uncontrolled GERD.  Dysphagia secondary to uncontrolled reflux   FOOT SURGERY Bilateral    "nerve"     LEFT HEART CATH AND CORONARY ANGIOGRAPHY N/A 12/29/2018   Procedure: LEFT HEART CATH AND CORONARY ANGIOGRAPHY;  Surgeon: Jettie Booze, MD;  Location: [place] [facility];  Service: Cardiovascular;  Laterality: N/A;   LUNG BIOPSY     MASS EXCISION Right 01/09/2013   Procedure: EXCISION OF NEOPLASM OF RIGHT  AXILLA  AND EXCISION OF NEOPLASM OF LEFT AXILLA;  Surgeon: Jamesetta So, MD;  Location: [facility];  Service: General;  Laterality: Right;  procedure end @ 08:23   MYRINGOTOMY WITH TUBE PLACEMENT Bilateral 04/28/2017   Procedure: BILATERAL MYRINGOTOMY WITH TUBE PLACEMENT;  Surgeon: Leta Baptist, MD;  Location: [place];  Service: ENT;  Laterality: Bilateral;   REVISION OF ARTERIOVENOUS GORETEX GRAFT Left 05/04/2018   Procedure: TRANSPOSITION OF CEPHALIC VEIN ARTERIOVENOUS FISTULA LEFT ARM;  Surgeon: Rosetta Posner, MD;  Location: [place];  Service: Vascular;  Laterality: Left;   SAVORY DILATION N/A 12/18/2016   Procedure: SAVORY DILATION;  Surgeon: Danie Binder, MD;  [facility];  Service: Endoscopy;  Laterality: N/A;   Social History   Socioeconomic History   Marital status: Married    Spouse name: Not on file   Number of children: 2  Occupational History   CNA  Tobacco Use   Smoking status: Never Smoker   Smokeless tobacco: Never Used  Substance and Sexual Activity   Alcohol use: No   Drug use: No   Current Outpatient Medications on File Prior to Visit  Medication Sig Dispense Refill   amLODipine (NORVASC) 5 MG tablet On dialysis days (Monday, Wednesday, Friday) hold the morning dose - all other days may take twice a day as planned  aspirin 81 MG chewable tablet CHEW 2 TABLETS BY MOUTH ONCE DAILY 56 tablet 11   B Complex-C-Folic Acid (RENA-VITE RX) 1 MG TABS Take 1 tablet by mouth daily.     calcitRIOL (ROCALTROL) 0.25 MCG capsule Take 0.25 capsules by mouth.     cloNIDine (CATAPRES) 0.2  MG tablet Take 0.2 mg by mouth 2 (two) times daily.     Continuous Blood Gluc Sensor (FREESTYLE LIBRE 2 SENSOR) MISC 1 each by Does not apply route every 14 (fourteen) days. 6 each 3   cyclobenzaprine (FLEXERIL) 5 MG tablet TAKE ONE TABLET BY MOUTH AT BEDTIME AS NEEDED FOR MUSCLE SPASMS. 30 tablet 0   DULoxetine (CYMBALTA) 60 MG capsule TAKE (1) CAPSULE BY MOUTH ONCE DAILY. 28 capsule 11   ezetimibe (ZETIA) 10 MG tablet TAKE 1 TABLET BY MOUTH ONCE DAILY. 28 tablet 11   insulin aspart protamine - aspart (NOVOLOG 70/30 FLEXPEN) (70-30) 100 UNIT/ML FlexPen Inject 24-30 Units into the skin 3 (three) times daily before meals. 30 mL 5   Insulin Glargine, 2 Unit Dial, (TOUJEO MAX SOLOSTAR) 300 UNIT/ML SOPN Inject 50-56 Units into the skin See admin instructions. Take after dialysis 3 day a week. 3 pen 11   irbesartan (AVAPRO) 150 MG tablet SMARTSIG:1 Tablet(s) By Mouth Every Evening     isosorbide mononitrate (IMDUR) 30 MG 24 hr tablet TAKE 1 TABLET BY MOUTH ONCE DAILY. 28 tablet 11   levalbuterol (XOPENEX) 1.25 MG/0.5ML nebulizer solution Take 1.25 mg by nebulization every 4 (four) hours as needed for wheezing or shortness of breath. 1 each 0   Melatonin 3 MG TBDP Take one tablet by mouth at bedtime , for sleep 30 tablet 2   metoprolol succinate (TOPROL-XL) 50 MG 24 hr tablet TAKE (1) TABLET BY MOUTH DAILY WITH FOOD *TAKE AFTER DIALYSIS* 28 tablet 11   midodrine (PROAMATINE) 10 MG tablet TAKE 1 TABLET BY MOUTH TWICE DAILY 56 tablet 5   ondansetron (ZOFRAN) 4 MG tablet Take 1 tablet (4 mg total) by mouth every 8 (eight) hours as needed for nausea or vomiting. 20 tablet 0   phenol (CHLORASEPTIC) 1.4 % LIQD Use as directed 1 spray in the mouth or throat as needed for throat irritation / pain. 177 mL 0   pregabalin (LYRICA) 50 MG capsule TAKE (1) CAPSULE BY MOUTH THREE TIMES DAILY 84 capsule 0   sertraline (ZOLOFT) 100 MG tablet TAKE 1 TABLET BY MOUTH EVERY DAY 90 tablet 2   torsemide (DEMADEX) 100 MG tablet  Take 1 tablet (100 mg total) by mouth 2 (two) times daily. On dialysis day (Monday, Wednesday, Friday) hold the morning dose - may take evening dose as planned.  All other days, may take twice a day as planned.     triamcinolone cream (KENALOG) 0.1 % APPLY TO AFFECTED AREA TWICE A DAY 30 g 0   ULTICARE MINI PEN NEEDLES 31G X 6 MM MISC USE AS DIRECTED 100 each 0   [name] 500 MG chewable tablet Chew 500 mg by mouth See admin instructions. Take 500 mg with each snack and meal     zolpidem (AMBIEN) 5 MG tablet TAKE 1 TABLET BY MOUTH AT BEDTIME AS NEEDED. 30 tablet 0   [DISCONTINUED] FLUoxetine (PROZAC) 10 MG capsule Take 10 mg by mouth daily.       [DISCONTINUED] glipiZIDE (GLUCOTROL) 10 MG tablet Take 10 mg by mouth 2 (two) times daily before a meal.       Current Facility-Administered Medications on File Prior to  Visit  Medication Dose Route Frequency Provider Last Rate Last Admin   0.9 %  sodium chloride infusion   Intravenous Continuous Lockamy, Randi L, NP-C 20 mL/hr at 07/19/19 1641 Rate Verify at 07/19/19 1641   Allergies  Allergen Reactions   Ace Inhibitors Anaphylaxis and Swelling   Penicillins Itching, Swelling and Other (See Comments)    Did it involve swelling of the face/tongue/throat, SOB, or low BP? Unknown Did it involve sudden or severe rash/hives, skin peeling, or any reaction on the inside of your mouth or nose? Unknown Did you need to seek medical attention at a hospital or doctor's office? Unknown When did it last happen?      years  If all above answers are NO, may proceed with cephalosporin use.    Statins Other (See Comments)    elevated LFT's     Albuterol Swelling   Family History  Problem Relation Age of Onset   Hypertension Father    Hypercholesterolemia Father    Arthritis Father    Hypertension Sister    Hypercholesterolemia Sister    Breast cancer Sister    Hypertension Sister    Colon cancer Neg Hx    Colon polyps Neg Hx    PE: BP 128/72 (BP  Location: Right Arm, Patient Position: Sitting, Cuff Size: Normal)    Pulse 86    Ht 5\' 4"  (1.626 m)    Wt 205 lb 12.8 oz (93.4 kg)    SpO2 95%    BMI 35.33 kg/m  Wt Readings from Last 3 Encounters:  05/02/21 205 lb 12.8 oz (93.4 kg)  03/13/21 208 lb 3.2 oz (94.4 kg)  03/06/21 212 lb (96.2 kg)   Constitutional: overweight, in NAD, moon facies, truncal obesity Eyes: PERRLA, EOMI, no exophthalmos ENT: moist mucous membranes, no thyromegaly, no cervical lymphadenopathy Cardiovascular: RRR, No RG, +1/6 SEM, + bilateral leg swelling, pitting Respiratory: CTA B Musculoskeletal: no deformities, strength intact in all 4 Skin: moist, warm, + dark macular rash and dry skin on her chest and neck Neurological: no tremor with outstretched hands, DTR normal in all 4  ASSESSMENT: 1. DM2, insulin-dependent, uncontrolled, with complications - CKD stage 4 - PN  2. HL  3. B adrenal adenomas  PLAN:  1. Patient with longstanding, uncontrolled, type 2 diabetes, previously on a premixed insulin regimen and also long-acting insulin, then on basal bolus insulin regimen.  At last visit she returned after a long absence of 1.5 years.  At that time, HbA1c was high, at 9.3%, above target.  Sugars were very fluctuating, slightly better overnight but then increasing abruptly after breakfast, and after lunch and less after dinner.  They were dropping at bedtime and increasing again around 4 AM.  We decreased the dose of her Toujeo and I advised her about how to change her mealtime insulin.  We also discussed that if she missed taking insulin 15 minutes before a meal, to only take half of the insulin after the meal.  I advised her not to correct blood sugars at bedtime unless they are higher than 300s, and in that case, to take no more than 4 units.  I sent a prescription for the freestyle libre 2 CGM to her pharmacy at that time since she was interested in a sensor with alarms.  She was interested in an insulin pump.  I  was not sure at that time whether she was completely capable to manage an insulin pump, but advised her to call the insurance company  to see whether this is even covered for her. -At this visit she tells me that she was out of insulin for 2 months as she could not get it from the pharmacy due to change in insurance coverage.  I was not aware of this.  She finally obtained premixed insulin as she is not taking this 3 times a day.  She started this 2 weeks ago.  Sugars remain high.  We discussed that this is not a very flexible regimen and I recommended to go back to basal/bolus insulin regimen.  She agrees with this.  At this visit we gave her samples of a long-acting insulin Tyler Aas) and a rapid acting insulin (NovoLog), but I did send prescriptions for Toujeo and Humalog to her pharmacy.  We discussed that if these are not covered, we can work with any other analog insulins that are covered. -Attaching the freestyle libre CGM will greatly help.  She was out of this for several months, but finally obtained it from the pharmacy. - I suggested to:  Patient Instructions  Please stop 70/30 insulin and start: - Toujeo 70 units in the evening - Humalog : 20 units before a smaller meal 24 units before a regular meal 28 units before a larger meal Take Humalog EVERY TIME you eat, 15 min before the meal, if sugars are >70.  Please return in 1.5 months.     - we checked her HbA1c: 11.4% (very high) - advised to check sugars at different times of the day - 4x a day, rotating check times - advised for yearly eye exams >> she is UTD - return to clinic in 1.5 months  2. HL -Reviewed latest lipid panel from last visit: LDL above target, triglycerides high, HDL normal: Lab Results  Component Value Date   CHOL 190 11/21/2020   HDL 43.10 11/21/2020   LDLCALC 114 (H) 03/08/2018   LDLDIRECT 120.0 11/21/2020   TRIG 259 (H) 12/02/2020   CHOLHDL 4 11/21/2020  -Continues on Zetia 10 mg daily without side  effects.  She previously had transaminitis from statins.  3.  Bilateral adrenal adenoma -Patient with history of bilateral adrenal adenomas per review of her abdominal MRI from 2019.  Imaging characteristics pointed towards benign tumors, of 2.5 and 1.6 cm, respectively.  However, they have appeared to increase over time.  Reviewing the MRI of the abdomen from 2010 and the CT of the abdomen and pelvis from 2017, only 1 nodule was seen in both, in the left adrenal.  She had another MRI in 03/2018 which showed a stable left 1.9 cm adrenal nodule. -We investigated her adrenal status.  Plasma catecholamines, metanephrines, and aldosterone  were normal between 2019-2022.  Norepinephrine was slightly elevated (nonspecific) 09/2018. -The investigation of her cortisol status is difficult due to being on dialysis. These patients usually have higher cortisol levels due to increased inflammation but not necessarily increase cortisol production.  Therefore, we checked a dexamethasone suppression test that showed an elevated cortisol.  Midnight salivary cortisol x2 was also elevated (however, this can be influenced by the uncontrolled diabetes).  Therefore, it is difficult to make a diagnosis of hyperproductive hypercortisolism and to treat her for this, especially since she has bilateral adrenal masses, and even if hormone overproduction is established, precise localization would not be possible.  Therefore, for now, we will continue to follow her for this.  I plan to repeat another salivary cortisol level at next visit to monitor trend.  At that time, I will  make sure that she does not have a late-night chronorhythm, in which case the cortisol may be higher.  We will also try to obtain the sample in the night prior to dialysis.   [name], MD PhD [facility] Endocrinology

## 2021-05-02 NOTE — Progress Notes (Signed)
Samples of Tresiba U200 and [employer] given to pt.

## 2021-05-05 DIAGNOSIS — Z992 Dependence on renal dialysis: Secondary | ICD-10-CM | POA: Diagnosis not present

## 2021-05-05 DIAGNOSIS — N2581 Secondary hyperparathyroidism of renal origin: Secondary | ICD-10-CM | POA: Diagnosis not present

## 2021-05-05 DIAGNOSIS — D631 Anemia in chronic kidney disease: Secondary | ICD-10-CM | POA: Diagnosis not present

## 2021-05-05 DIAGNOSIS — D509 Iron deficiency anemia, unspecified: Secondary | ICD-10-CM | POA: Diagnosis not present

## 2021-05-05 DIAGNOSIS — N186 End stage renal disease: Secondary | ICD-10-CM | POA: Diagnosis not present

## 2021-05-07 DIAGNOSIS — N186 End stage renal disease: Secondary | ICD-10-CM | POA: Diagnosis not present

## 2021-05-07 DIAGNOSIS — D509 Iron deficiency anemia, unspecified: Secondary | ICD-10-CM | POA: Diagnosis not present

## 2021-05-07 DIAGNOSIS — D631 Anemia in chronic kidney disease: Secondary | ICD-10-CM | POA: Diagnosis not present

## 2021-05-07 DIAGNOSIS — Z992 Dependence on renal dialysis: Secondary | ICD-10-CM | POA: Diagnosis not present

## 2021-05-07 DIAGNOSIS — N2581 Secondary hyperparathyroidism of renal origin: Secondary | ICD-10-CM | POA: Diagnosis not present

## 2021-05-08 ENCOUNTER — Other Ambulatory Visit: Payer: Self-pay

## 2021-05-08 ENCOUNTER — Ambulatory Visit (INDEPENDENT_AMBULATORY_CARE_PROVIDER_SITE_OTHER): Payer: Medicare Other

## 2021-05-08 VITALS — Wt 205.0 lb

## 2021-05-08 DIAGNOSIS — Z0000 Encounter for general adult medical examination without abnormal findings: Secondary | ICD-10-CM

## 2021-05-08 NOTE — Progress Notes (Signed)
I connected with  Kathryn Beck on 05/08/21 by a audio enabled telemedicine application and verified that I am speaking with the correct person using two identifiers.  Patient Location: Home  Provider Location: Office/Clinic  I discussed the limitations of evaluation and management by telemedicine. The patient expressed understanding and agreed to proceed.  Subjective:   Kathryn Beck is a 66 y.o. female who presents for Medicare Annual (Subsequent) preventive examination.  Review of Systems           Objective:    There were no vitals filed for this visit. There is no height or weight on file to calculate BMI.  Advanced Directives 12/02/2020 11/14/2020 07/25/2019 07/19/2019 04/03/2019 03/28/2019 03/20/2019  Does Patient Have a Medical Advance Directive? No No No No No No No  Type of Advance Directive - - - - - - -  Does patient want to make changes to medical advance directive? - - - - - - -  Would patient like information on creating a medical advance directive? No - Patient declined No - Patient declined No - Patient declined No - Patient declined No - Patient declined - No - Patient declined  Pre-existing out of facility DNR order (yellow form or pink MOST form) - - - - - - -    Current Medications (verified) Outpatient Encounter Medications as of 05/08/2021  Medication Sig   amLODipine (NORVASC) 5 MG tablet On dialysis days (Monday, Wednesday, Friday) hold the morning dose - all other days may take twice a day as planned   aspirin 81 MG chewable tablet CHEW 2 TABLETS BY MOUTH ONCE DAILY   B Complex-C-Folic Acid (RENA-VITE RX) 1 MG TABS Take 1 tablet by mouth daily.   calcitRIOL (ROCALTROL) 0.25 MCG capsule Take 0.25 capsules by mouth.   cloNIDine (CATAPRES) 0.2 MG tablet Take 0.2 mg by mouth 2 (two) times daily.   Continuous Blood Gluc Sensor (FREESTYLE LIBRE 2 SENSOR) MISC 1 each by Does not apply route every 14 (fourteen) days.   cyclobenzaprine (FLEXERIL) 5 MG tablet TAKE  ONE TABLET BY MOUTH AT BEDTIME AS NEEDED FOR MUSCLE SPASMS.   DULoxetine (CYMBALTA) 60 MG capsule TAKE (1) CAPSULE BY MOUTH ONCE DAILY.   ezetimibe (ZETIA) 10 MG tablet TAKE 1 TABLET BY MOUTH ONCE DAILY.   insulin glargine, 2 Unit Dial, (TOUJEO MAX SOLOSTAR) 300 UNIT/ML Solostar Pen Inject 50-56 Units into the skin See admin instructions. Take after dialysis 3 day a week.   insulin lispro (HUMALOG KWIKPEN) 200 UNIT/ML KwikPen Inject 18-30 Units into the skin daily.   Insulin Pen Needle 32G X 4 MM MISC Use 4x a day   irbesartan (AVAPRO) 150 MG tablet SMARTSIG:1 Tablet(s) By Mouth Every Evening   isosorbide mononitrate (IMDUR) 30 MG 24 hr tablet TAKE 1 TABLET BY MOUTH ONCE DAILY.   levalbuterol (XOPENEX) 1.25 MG/0.5ML nebulizer solution Take 1.25 mg by nebulization every 4 (four) hours as needed for wheezing or shortness of breath.   Melatonin 3 MG TBDP Take one tablet by mouth at bedtime , for sleep   metoprolol succinate (TOPROL-XL) 50 MG 24 hr tablet TAKE (1) TABLET BY MOUTH DAILY WITH FOOD *TAKE AFTER DIALYSIS*   midodrine (PROAMATINE) 10 MG tablet TAKE 1 TABLET BY MOUTH TWICE DAILY   ondansetron (ZOFRAN) 4 MG tablet Take 1 tablet (4 mg total) by mouth every 8 (eight) hours as needed for nausea or vomiting.   phenol (CHLORASEPTIC) 1.4 % LIQD Use as directed 1 spray in the mouth  or throat as needed for throat irritation / pain.   pregabalin (LYRICA) 50 MG capsule TAKE (1) CAPSULE BY MOUTH THREE TIMES DAILY   sertraline (ZOLOFT) 100 MG tablet TAKE 1 TABLET BY MOUTH EVERY DAY   torsemide (DEMADEX) 100 MG tablet Take 1 tablet (100 mg total) by mouth 2 (two) times daily. On dialysis day (Monday, Wednesday, Friday) hold the morning dose - may take evening dose as planned.  All other days, may take twice a day as planned.   triamcinolone cream (KENALOG) 0.1 % APPLY TO AFFECTED AREA TWICE A DAY   [name] 500 MG chewable tablet Chew 500 mg by mouth See admin instructions. Take 500 mg with each snack and  meal   zolpidem (AMBIEN) 5 MG tablet TAKE 1 TABLET BY MOUTH AT BEDTIME AS NEEDED.   [DISCONTINUED] FLUoxetine (PROZAC) 10 MG capsule Take 10 mg by mouth daily.     [DISCONTINUED] glipiZIDE (GLUCOTROL) 10 MG tablet Take 10 mg by mouth 2 (two) times daily before a meal.     Facility-Administered Encounter Medications as of 05/08/2021  Medication   0.9 %  sodium chloride infusion    Allergies (verified) Ace inhibitors, Penicillins, Statins, and Albuterol   History: Past Medical History:  Diagnosis Date   Acid reflux    Anemia    Arthritis    Axillary masses    Soft tissue - status post excision   Back pain    COVID-19 virus infection 04/06/2019   Depression    End-stage renal disease (HCC)    TTHSat  in [place] hypertension    History of blood transfusion    History of cardiac catheterization    Normal coronary arteries October 2020   History of claustrophobia    History of pneumonia 2019   Hypoxia 04/03/2019   Mixed hyperlipidemia    Obesity    Pancreatitis    Peritoneal dialysis catheter in place [facility])    Pneumonia due to COVID-19 virus 04/02/2019   Sleep apnea    Noncompliant with CPAP   Type 2 diabetes mellitus ([place])    Past Surgical History:  Procedure Laterality Date   ABDOMINAL HYSTERECTOMY     AV FISTULA PLACEMENT Left 09/02/2017   Procedure: creation of left arm ARTERIOVENOUS (AV) FISTULA;  Surgeon: Serafina Mitchell, MD;  Location: [facility];  Service: Vascular;  Laterality: Left;   COLONOSCOPY  2008   Dr. Oneida Alar: normal    COLONOSCOPY N/A 12/18/2016   Dr. Oneida Alar: multiple tubular adenomas, internal hemorrhoids. Surveillance in 3 years    ESOPHAGEAL DILATION N/A 10/13/2015   Procedure: ESOPHAGEAL DILATION;  Surgeon: Rogene Houston, MD;  [facility];  Service: Endoscopy;  Laterality: N/A;   ESOPHAGOGASTRODUODENOSCOPY N/A 10/13/2015   Dr. Laural Golden: chronic gastritis on path, no H.pylori. Empiric dilation    ESOPHAGOGASTRODUODENOSCOPY N/A  12/18/2016   Dr. Oneida Alar: mild gastritis. BRAVO study revealed uncontrolled GERD. Dysphagia secondary to uncontrolled reflux   FOOT SURGERY Bilateral    "nerve"     LEFT HEART CATH AND CORONARY ANGIOGRAPHY N/A 12/29/2018   Procedure: LEFT HEART CATH AND CORONARY ANGIOGRAPHY;  Surgeon: Jettie Booze, MD;  Location: [place];  Service: Cardiovascular;  Laterality: N/A;   LUNG BIOPSY     MASS EXCISION Right 01/09/2013   Procedure: EXCISION OF NEOPLASM OF RIGHT  AXILLA  AND EXCISION OF NEOPLASM OF LEFT AXILLA;  Surgeon: Jamesetta So, MD;  Location: [facility];  Service: General;  Laterality: Right;  procedure end @ 08:23   MYRINGOTOMY WITH TUBE PLACEMENT Bilateral 04/28/2017   Procedure: BILATERAL MYRINGOTOMY WITH TUBE PLACEMENT;  Surgeon: Leta Baptist, MD;  Location: [facility];  Service: ENT;  Laterality: Bilateral;   REVISION OF ARTERIOVENOUS GORETEX GRAFT Left 05/04/2018   Procedure: TRANSPOSITION OF CEPHALIC VEIN ARTERIOVENOUS FISTULA LEFT ARM;  Surgeon: Rosetta Posner, MD;  Location: [facility];  Service: Vascular;  Laterality: Left;   SAVORY DILATION N/A 12/18/2016   Procedure: SAVORY DILATION;  Surgeon: Danie Binder, MD;  [facility];  Service: Endoscopy;  Laterality: N/A;   Family History  Problem Relation Age of Onset   Hypertension Father    Hypercholesterolemia Father    Arthritis Father    Hypertension Sister    Hypercholesterolemia Sister    Breast cancer Sister    Hypertension Sister    Colon cancer Neg Hx    Colon polyps Neg Hx    Social History   Socioeconomic History   Marital status: Married    Spouse name: larry    Number of children: 2   Years of education: 12   Highest education level: 12th grade  Occupational History   Occupation: retired   Tobacco Use   Smoking status: Never   Smokeless tobacco: Never  Scientific laboratory technician Use: Never used  Substance and Sexual Activity   Alcohol use: No   Drug use: No   Sexual activity: Not Currently  Other  Topics Concern   Not on file  Social History Narrative   Lives alone with husband    Social Determinants of Health   Financial Resource Strain: Not on file  Food Insecurity: Not on file  Transportation Needs: Not on file  Physical Activity: Not on file  Stress: Not on file  Social Connections: Not on file    Tobacco Counseling Counseling given: Not Answered   Clinical Intake:                 Diabetic? Yes         Activities of Daily Living No flowsheet data found.  Patient Care Team: Fayrene Helper, MD as PCP - General Domenic Polite Aloha Gell, MD as PCP - Cardiology (Cardiology) Gala Romney Cristopher Estimable, MD as Consulting Physician (Gastroenterology) Fran Lowes, MD (Inactive) as Consulting Physician (Nephrology) Dialysis, [facility] Malva Cogan, Elon Alas, DO as Consulting Physician (Internal Medicine)  Indicate any recent Medical Services you may have received from other than [facility] providers in the past year (date may be approximate).     Assessment:   This is a routine wellness examination for Cashlyn.  Hearing/Vision screen No results found.  Dietary issues and exercise activities discussed:     Goals Addressed   None   Depression Screen PHQ 2/9 Scores 03/06/2021 01/02/2021 12/02/2020 10/23/2020 09/24/2020 08/29/2020 06/18/2020  PHQ - 2 Score 0 4 2 5 6 2  0  PHQ- 9 Score - 15 8 18 19 11  -    Fall Risk Fall Risk  03/06/2021 01/02/2021 12/02/2020 10/23/2020 09/24/2020  Falls in the past year? 0 0 0 0 0  Comment - - - - -  Number falls in past yr: 0 - 0 0 0  Injury with Fall? 0 - 0 0 0  Risk for fall due to : - - No Fall Risks - No Fall Risks  Follow up - - Falls evaluation completed - Falls evaluation completed    FALL RISK PREVENTION PERTAINING TO THE HOME:  Any stairs in or  around the home? Yes  If so, are there any without handrails? Yes  Home free of loose throw rugs in walkways, pet beds, electrical cords, etc? Yes  Adequate lighting in your  home to reduce risk of falls? Yes   ASSISTIVE DEVICES UTILIZED TO PREVENT FALLS:  Life alert? No  Use of a cane, walker or w/c? Yes  Grab bars in the bathroom? No  Shower chair or bench in shower? Yes  Elevated toilet seat or a handicapped toilet? No   TIMED UP AND GO:  Was the test performed? No .     Cognitive Function:     6CIT Screen 10/03/2019 02/10/2018  What Year? 0 points 0 points  What month? 0 points 0 points  What time? 0 points 0 points  Count back from 20 0 points 0 points  Months in reverse 0 points 2 points  Repeat phrase 0 points 2 points  Total Score 0 4    Immunizations Immunization History  Administered Date(s) Administered   H1N1 01/16/2008   Hepatitis B, ped/adol 05/14/2018, 06/16/2018, 07/14/2018, 12/26/2018   Influenza Split 12/22/2012, 03/12/2015, 01/14/2016, 11/24/2016, 11/09/2017   Influenza Whole 12/28/2004, 02/24/2006, 12/07/2006, 02/01/2008, 01/28/2009, 11/25/2010   Influenza, High Dose Seasonal PF 12/30/2018   Influenza,inj,Quad PF,6+ Mos 12/22/2012, 03/12/2015, 01/14/2016, 11/24/2016, 11/09/2017, 01/02/2021   Influenza-Unspecified 12/26/2019   Moderna SARS-COV2 Booster Vaccination 04/30/2020   PPD Test 03/22/2018   Pneumococcal Conjugate-13 07/25/2015   Pneumococcal Polysaccharide-23 01/28/2009, 07/25/2015   Td 11/12/2003   Tdap 10/02/2014   Zoster Recombinat (Shingrix) 12/24/2016    TDAP status: Up to date  Flu Vaccine status: Up to date  Pneumococcal vaccine status: Declined,  Education has been provided regarding the importance of this vaccine but patient still declined. Advised may receive this vaccine at local pharmacy or Health Dept. Aware to provide a copy of the vaccination record if obtained from local pharmacy or Health Dept. Verbalized acceptance and understanding.   Covid-19 vaccine status: Completed vaccines  Qualifies for Shingles Vaccine? No   Zostavax completed No   Shingrix Completed?: No.    Education has been  provided regarding the importance of this vaccine. Patient has been advised to call insurance company to determine out of pocket expense if they have not yet received this vaccine. Advised may also receive vaccine at local pharmacy or Health Dept. Verbalized acceptance and understanding.  Screening Tests Health Maintenance  Topic Date Due   Zoster Vaccines- Shingrix (2 of 2) 02/18/2017   COVID-19 Vaccine (3 - Moderna risk series) 05/28/2020   Pneumonia Vaccine 72+ Years old (4 - PPSV23 if available, else PCV20) 04/06/2021   DEXA SCAN  Never done   MAMMOGRAM  05/24/2021   OPHTHALMOLOGY EXAM  08/07/2021   HEMOGLOBIN A1C  10/30/2021   FOOT EXAM  03/10/2022   PAP SMEAR-Modifier  09/20/2022   TETANUS/TDAP  10/01/2024   COLONOSCOPY (Pts 45-33yrs Insurance coverage will need to be confirmed)  12/19/2026   INFLUENZA VACCINE  Completed   Hepatitis C Screening  Completed   HIV Screening  Completed   HPV VACCINES  Aged Out    Health Maintenance  Health Maintenance Due  Topic Date Due   Zoster Vaccines- Shingrix (2 of 2) 02/18/2017   COVID-19 Vaccine (3 - Moderna risk series) 05/28/2020   Pneumonia Vaccine 51+ Years old (32 - PPSV23 if available, else PCV20) 04/06/2021   DEXA SCAN  Never done    Colorectal cancer screening: Type of screening: Colonoscopy. Completed 12/18/2016. Repeat every 2028 years  Mammogram status: Completed Pt has an appt to get hers completed. Repeat every year  Bone Density status: Completed  . Results reflect: Bone density results: NORMAL. Repeat every 2 years.  Lung Cancer Screening: (Low Dose CT Chest recommended if Age 59-80 years, 30 pack-year currently smoking OR have quit w/in 15years.) does not qualify.    Additional Screening:  Hepatitis C Screening: does not qualify; Completed 09/29/2012  Vision Screening: Recommended annual ophthalmology exams for early detection of glaucoma and other disorders of the eye. Is the patient up to date with their annual  eye exam?  Yes  Who is the provider or what is the name of the office in which the patient attends annual eye exams? [facility]  If pt is not established with a provider, would they like to be referred to a provider to establish care? No .   Dental Screening: Recommended annual dental exams for proper oral hygiene  Community Resource Referral / Chronic Care Management: CRR required this visit?  No   CCM required this visit?  No      Plan:     I have personally reviewed and noted the following in the patients chart:   Medical and social history Use of alcohol, tobacco or illicit drugs  Current medications and supplements including opioid prescriptions.  Functional ability and status Nutritional status Physical activity Advanced directives List of other physicians Hospitalizations, surgeries, and ER visits in previous 12 months Vitals Screenings to include cognitive, depression, and falls Referrals and appointments  In addition, I have reviewed and discussed with patient certain preventive protocols, quality metrics, and best practice recommendations. A written personalized care plan for preventive services as well as general preventive health recommendations were provided to patient.     Philis Pique Jamaar Howes, CMA   05/08/2021   Nurse Notes:  Ms. Boyland , Thank you for taking time to come for your Medicare Wellness Visit. I appreciate your ongoing commitment to your health goals. Please review the following plan we discussed and let me know if I can assist you in the future.   These are the goals we discussed:  Goals      DIET - EAT MORE FRUITS AND VEGETABLES     To increase her energy      DIET - REDUCE SUGAR INTAKE     Increase physical activity        This is a list of the screening recommended for you and due dates:  Health Maintenance  Topic Date Due   Zoster (Shingles) Vaccine (2 of 2) 02/18/2017   COVID-19 Vaccine (3 - Moderna risk series) 05/28/2020    Pneumonia Vaccine (4 - PPSV23 if available, else PCV20) 04/06/2021   DEXA scan (bone density measurement)  Never done   Mammogram  05/24/2021   Eye exam for diabetics  08/07/2021   Hemoglobin A1C  10/30/2021   Complete foot exam   03/10/2022   Pap Smear  09/20/2022   Tetanus Vaccine  10/01/2024   Colon Cancer Screening  12/19/2026   Flu Shot  Completed   Hepatitis C Screening: USPSTF Recommendation to screen - Ages 18-79 yo.  Completed   HIV Screening  Completed   HPV Vaccine  Aged Out

## 2021-05-08 NOTE — Patient Instructions (Signed)
°  Ms. Fahs , Thank you for taking time to come for your Medicare Wellness Visit. I appreciate your ongoing commitment to your health goals. Please review the following plan we discussed and let me know if I can assist you in the future.   These are the goals we discussed:  Goals      DIET - EAT MORE FRUITS AND VEGETABLES     To increase her energy      DIET - REDUCE SUGAR INTAKE     Increase physical activity        This is a list of the screening recommended for you and due dates:  Health Maintenance  Topic Date Due   Zoster (Shingles) Vaccine (2 of 2) 02/18/2017   COVID-19 Vaccine (3 - Moderna risk series) 05/28/2020   Pneumonia Vaccine (4 - PPSV23 if available, else PCV20) 04/06/2021   DEXA scan (bone density measurement)  Never done   Mammogram  05/24/2021   Eye exam for diabetics  08/07/2021   Hemoglobin A1C  10/30/2021   Complete foot exam   03/10/2022   Pap Smear  09/20/2022   Tetanus Vaccine  10/01/2024   Colon Cancer Screening  12/19/2026   Flu Shot  Completed   Hepatitis C Screening: USPSTF Recommendation to screen - Ages 18-79 yo.  Completed   HIV Screening  Completed   HPV Vaccine  Aged Out

## 2021-05-09 DIAGNOSIS — N186 End stage renal disease: Secondary | ICD-10-CM | POA: Diagnosis not present

## 2021-05-09 DIAGNOSIS — D509 Iron deficiency anemia, unspecified: Secondary | ICD-10-CM | POA: Diagnosis not present

## 2021-05-09 DIAGNOSIS — Z992 Dependence on renal dialysis: Secondary | ICD-10-CM | POA: Diagnosis not present

## 2021-05-09 DIAGNOSIS — D631 Anemia in chronic kidney disease: Secondary | ICD-10-CM | POA: Diagnosis not present

## 2021-05-09 DIAGNOSIS — N2581 Secondary hyperparathyroidism of renal origin: Secondary | ICD-10-CM | POA: Diagnosis not present

## 2021-05-12 DIAGNOSIS — D631 Anemia in chronic kidney disease: Secondary | ICD-10-CM | POA: Diagnosis not present

## 2021-05-12 DIAGNOSIS — N186 End stage renal disease: Secondary | ICD-10-CM | POA: Diagnosis not present

## 2021-05-12 DIAGNOSIS — Z992 Dependence on renal dialysis: Secondary | ICD-10-CM | POA: Diagnosis not present

## 2021-05-12 DIAGNOSIS — D509 Iron deficiency anemia, unspecified: Secondary | ICD-10-CM | POA: Diagnosis not present

## 2021-05-12 DIAGNOSIS — N2581 Secondary hyperparathyroidism of renal origin: Secondary | ICD-10-CM | POA: Diagnosis not present

## 2021-05-14 DIAGNOSIS — D631 Anemia in chronic kidney disease: Secondary | ICD-10-CM | POA: Diagnosis not present

## 2021-05-14 DIAGNOSIS — D509 Iron deficiency anemia, unspecified: Secondary | ICD-10-CM | POA: Diagnosis not present

## 2021-05-14 DIAGNOSIS — Z992 Dependence on renal dialysis: Secondary | ICD-10-CM | POA: Diagnosis not present

## 2021-05-14 DIAGNOSIS — N2581 Secondary hyperparathyroidism of renal origin: Secondary | ICD-10-CM | POA: Diagnosis not present

## 2021-05-14 DIAGNOSIS — N186 End stage renal disease: Secondary | ICD-10-CM | POA: Diagnosis not present

## 2021-05-16 DIAGNOSIS — D509 Iron deficiency anemia, unspecified: Secondary | ICD-10-CM | POA: Diagnosis not present

## 2021-05-16 DIAGNOSIS — D631 Anemia in chronic kidney disease: Secondary | ICD-10-CM | POA: Diagnosis not present

## 2021-05-16 DIAGNOSIS — N186 End stage renal disease: Secondary | ICD-10-CM | POA: Diagnosis not present

## 2021-05-16 DIAGNOSIS — N2581 Secondary hyperparathyroidism of renal origin: Secondary | ICD-10-CM | POA: Diagnosis not present

## 2021-05-16 DIAGNOSIS — Z992 Dependence on renal dialysis: Secondary | ICD-10-CM | POA: Diagnosis not present

## 2021-05-19 DIAGNOSIS — D509 Iron deficiency anemia, unspecified: Secondary | ICD-10-CM | POA: Diagnosis not present

## 2021-05-19 DIAGNOSIS — N186 End stage renal disease: Secondary | ICD-10-CM | POA: Diagnosis not present

## 2021-05-19 DIAGNOSIS — N2581 Secondary hyperparathyroidism of renal origin: Secondary | ICD-10-CM | POA: Diagnosis not present

## 2021-05-19 DIAGNOSIS — Z992 Dependence on renal dialysis: Secondary | ICD-10-CM | POA: Diagnosis not present

## 2021-05-19 DIAGNOSIS — D631 Anemia in chronic kidney disease: Secondary | ICD-10-CM | POA: Diagnosis not present

## 2021-05-20 DIAGNOSIS — N186 End stage renal disease: Secondary | ICD-10-CM | POA: Diagnosis not present

## 2021-05-20 DIAGNOSIS — Z992 Dependence on renal dialysis: Secondary | ICD-10-CM | POA: Diagnosis not present

## 2021-05-21 DIAGNOSIS — Z992 Dependence on renal dialysis: Secondary | ICD-10-CM | POA: Diagnosis not present

## 2021-05-21 DIAGNOSIS — N186 End stage renal disease: Secondary | ICD-10-CM | POA: Diagnosis not present

## 2021-05-21 DIAGNOSIS — D631 Anemia in chronic kidney disease: Secondary | ICD-10-CM | POA: Diagnosis not present

## 2021-05-21 DIAGNOSIS — D509 Iron deficiency anemia, unspecified: Secondary | ICD-10-CM | POA: Diagnosis not present

## 2021-05-21 DIAGNOSIS — N2581 Secondary hyperparathyroidism of renal origin: Secondary | ICD-10-CM | POA: Diagnosis not present

## 2021-05-23 DIAGNOSIS — D631 Anemia in chronic kidney disease: Secondary | ICD-10-CM | POA: Diagnosis not present

## 2021-05-23 DIAGNOSIS — D509 Iron deficiency anemia, unspecified: Secondary | ICD-10-CM | POA: Diagnosis not present

## 2021-05-23 DIAGNOSIS — N186 End stage renal disease: Secondary | ICD-10-CM | POA: Diagnosis not present

## 2021-05-23 DIAGNOSIS — N2581 Secondary hyperparathyroidism of renal origin: Secondary | ICD-10-CM | POA: Diagnosis not present

## 2021-05-23 DIAGNOSIS — Z992 Dependence on renal dialysis: Secondary | ICD-10-CM | POA: Diagnosis not present

## 2021-05-26 DIAGNOSIS — N2581 Secondary hyperparathyroidism of renal origin: Secondary | ICD-10-CM | POA: Diagnosis not present

## 2021-05-26 DIAGNOSIS — Z992 Dependence on renal dialysis: Secondary | ICD-10-CM | POA: Diagnosis not present

## 2021-05-26 DIAGNOSIS — D631 Anemia in chronic kidney disease: Secondary | ICD-10-CM | POA: Diagnosis not present

## 2021-05-26 DIAGNOSIS — N186 End stage renal disease: Secondary | ICD-10-CM | POA: Diagnosis not present

## 2021-05-26 DIAGNOSIS — D509 Iron deficiency anemia, unspecified: Secondary | ICD-10-CM | POA: Diagnosis not present

## 2021-05-28 DIAGNOSIS — N186 End stage renal disease: Secondary | ICD-10-CM | POA: Diagnosis not present

## 2021-05-28 DIAGNOSIS — Z992 Dependence on renal dialysis: Secondary | ICD-10-CM | POA: Diagnosis not present

## 2021-05-28 DIAGNOSIS — D509 Iron deficiency anemia, unspecified: Secondary | ICD-10-CM | POA: Diagnosis not present

## 2021-05-28 DIAGNOSIS — N2581 Secondary hyperparathyroidism of renal origin: Secondary | ICD-10-CM | POA: Diagnosis not present

## 2021-05-28 DIAGNOSIS — D631 Anemia in chronic kidney disease: Secondary | ICD-10-CM | POA: Diagnosis not present

## 2021-05-30 ENCOUNTER — Other Ambulatory Visit: Payer: Self-pay | Admitting: Orthopedic Surgery

## 2021-05-30 DIAGNOSIS — D509 Iron deficiency anemia, unspecified: Secondary | ICD-10-CM | POA: Diagnosis not present

## 2021-05-30 DIAGNOSIS — N2581 Secondary hyperparathyroidism of renal origin: Secondary | ICD-10-CM | POA: Diagnosis not present

## 2021-05-30 DIAGNOSIS — N186 End stage renal disease: Secondary | ICD-10-CM | POA: Diagnosis not present

## 2021-05-30 DIAGNOSIS — Z992 Dependence on renal dialysis: Secondary | ICD-10-CM | POA: Diagnosis not present

## 2021-05-30 DIAGNOSIS — M545 Low back pain, unspecified: Secondary | ICD-10-CM

## 2021-05-30 DIAGNOSIS — D631 Anemia in chronic kidney disease: Secondary | ICD-10-CM | POA: Diagnosis not present

## 2021-06-02 ENCOUNTER — Other Ambulatory Visit (HOSPITAL_COMMUNITY): Payer: Self-pay

## 2021-06-02 ENCOUNTER — Other Ambulatory Visit: Payer: Self-pay | Admitting: Family Medicine

## 2021-06-02 DIAGNOSIS — D472 Monoclonal gammopathy: Secondary | ICD-10-CM

## 2021-06-02 DIAGNOSIS — D509 Iron deficiency anemia, unspecified: Secondary | ICD-10-CM | POA: Diagnosis not present

## 2021-06-02 DIAGNOSIS — N186 End stage renal disease: Secondary | ICD-10-CM | POA: Diagnosis not present

## 2021-06-02 DIAGNOSIS — Z992 Dependence on renal dialysis: Secondary | ICD-10-CM | POA: Diagnosis not present

## 2021-06-02 DIAGNOSIS — N2581 Secondary hyperparathyroidism of renal origin: Secondary | ICD-10-CM | POA: Diagnosis not present

## 2021-06-02 DIAGNOSIS — D631 Anemia in chronic kidney disease: Secondary | ICD-10-CM | POA: Diagnosis not present

## 2021-06-02 DIAGNOSIS — D638 Anemia in other chronic diseases classified elsewhere: Secondary | ICD-10-CM

## 2021-06-03 ENCOUNTER — Inpatient Hospital Stay (HOSPITAL_COMMUNITY): Payer: Medicare Other | Attending: Hematology

## 2021-06-04 DIAGNOSIS — N2581 Secondary hyperparathyroidism of renal origin: Secondary | ICD-10-CM | POA: Diagnosis not present

## 2021-06-04 DIAGNOSIS — Z992 Dependence on renal dialysis: Secondary | ICD-10-CM | POA: Diagnosis not present

## 2021-06-04 DIAGNOSIS — D509 Iron deficiency anemia, unspecified: Secondary | ICD-10-CM | POA: Diagnosis not present

## 2021-06-04 DIAGNOSIS — N186 End stage renal disease: Secondary | ICD-10-CM | POA: Diagnosis not present

## 2021-06-04 DIAGNOSIS — D631 Anemia in chronic kidney disease: Secondary | ICD-10-CM | POA: Diagnosis not present

## 2021-06-06 ENCOUNTER — Telehealth: Payer: Self-pay | Admitting: Family Medicine

## 2021-06-06 NOTE — Telephone Encounter (Signed)
Daughter wants a call back  ?

## 2021-06-06 NOTE — Telephone Encounter (Signed)
Called in on patient behalf. ?Patient is experiencing cold symptoms.  ?Doesn't want to turn into pneumonia ( history)  ?Covid -  ? Upper respitory ? ?Wants to know if patient can get something sent in to help. ?

## 2021-06-07 DIAGNOSIS — N2581 Secondary hyperparathyroidism of renal origin: Secondary | ICD-10-CM | POA: Diagnosis not present

## 2021-06-07 DIAGNOSIS — Z992 Dependence on renal dialysis: Secondary | ICD-10-CM | POA: Diagnosis not present

## 2021-06-07 DIAGNOSIS — D631 Anemia in chronic kidney disease: Secondary | ICD-10-CM | POA: Diagnosis not present

## 2021-06-07 DIAGNOSIS — N186 End stage renal disease: Secondary | ICD-10-CM | POA: Diagnosis not present

## 2021-06-07 DIAGNOSIS — D509 Iron deficiency anemia, unspecified: Secondary | ICD-10-CM | POA: Diagnosis not present

## 2021-06-09 DIAGNOSIS — D631 Anemia in chronic kidney disease: Secondary | ICD-10-CM | POA: Diagnosis not present

## 2021-06-09 DIAGNOSIS — Z992 Dependence on renal dialysis: Secondary | ICD-10-CM | POA: Diagnosis not present

## 2021-06-09 DIAGNOSIS — N2581 Secondary hyperparathyroidism of renal origin: Secondary | ICD-10-CM | POA: Diagnosis not present

## 2021-06-09 DIAGNOSIS — N186 End stage renal disease: Secondary | ICD-10-CM | POA: Diagnosis not present

## 2021-06-09 DIAGNOSIS — D509 Iron deficiency anemia, unspecified: Secondary | ICD-10-CM | POA: Diagnosis not present

## 2021-06-09 NOTE — Telephone Encounter (Signed)
Kathryn Beck spoke with daughter and advised urgent care and could follow up this week with provider  ?

## 2021-06-10 ENCOUNTER — Other Ambulatory Visit (HOSPITAL_COMMUNITY)
Admission: RE | Admit: 2021-06-10 | Discharge: 2021-06-10 | Disposition: A | Discharge status: Home / Self Care | Payer: Medicare Other | Source: Ambulatory Visit | Attending: Hematology | Admitting: Hematology

## 2021-06-10 ENCOUNTER — Other Ambulatory Visit: Payer: Self-pay

## 2021-06-10 ENCOUNTER — Encounter: Payer: Self-pay | Admitting: Nurse Practitioner

## 2021-06-10 ENCOUNTER — Ambulatory Visit (HOSPITAL_COMMUNITY)
Admission: RE | Admit: 2021-06-10 | Discharge: 2021-06-10 | Disposition: A | Discharge status: Home / Self Care | Payer: Medicare Other | Source: Ambulatory Visit | Attending: Nurse Practitioner | Admitting: Nurse Practitioner

## 2021-06-10 ENCOUNTER — Ambulatory Visit (HOSPITAL_COMMUNITY): Payer: 59 | Admitting: Physician Assistant

## 2021-06-10 ENCOUNTER — Ambulatory Visit (INDEPENDENT_AMBULATORY_CARE_PROVIDER_SITE_OTHER): Payer: Medicare Other | Admitting: Nurse Practitioner

## 2021-06-10 VITALS — BP 180/90

## 2021-06-10 DIAGNOSIS — D638 Anemia in other chronic diseases classified elsewhere: Secondary | ICD-10-CM | POA: Insufficient documentation

## 2021-06-10 DIAGNOSIS — G8929 Other chronic pain: Secondary | ICD-10-CM | POA: Diagnosis not present

## 2021-06-10 DIAGNOSIS — H9203 Otalgia, bilateral: Secondary | ICD-10-CM | POA: Diagnosis not present

## 2021-06-10 DIAGNOSIS — J029 Acute pharyngitis, unspecified: Secondary | ICD-10-CM | POA: Insufficient documentation

## 2021-06-10 DIAGNOSIS — I152 Hypertension secondary to endocrine disorders: Secondary | ICD-10-CM | POA: Diagnosis not present

## 2021-06-10 DIAGNOSIS — D472 Monoclonal gammopathy: Secondary | ICD-10-CM

## 2021-06-10 DIAGNOSIS — M25512 Pain in left shoulder: Secondary | ICD-10-CM

## 2021-06-10 DIAGNOSIS — E1159 Type 2 diabetes mellitus with other circulatory complications: Secondary | ICD-10-CM | POA: Diagnosis not present

## 2021-06-10 LAB — CBC WITH DIFFERENTIAL/PLATELET
Abs Immature Granulocytes: 0.03 10*3/uL (ref 0.00–0.07)
Basophils Absolute: 0.1 10*3/uL (ref 0.0–0.1)
Basophils Relative: 1 %
Eosinophils Absolute: 0.2 10*3/uL (ref 0.0–0.5)
Eosinophils Relative: 3 %
HCT: 34.2 % — ABNORMAL LOW (ref 36.0–46.0)
Hemoglobin: 10.1 g/dL — ABNORMAL LOW (ref 12.0–15.0)
Immature Granulocytes: 1 %
Lymphocytes Relative: 30 %
Lymphs Abs: 1.8 10*3/uL (ref 0.7–4.0)
MCH: 25.9 pg — ABNORMAL LOW (ref 26.0–34.0)
MCHC: 29.5 g/dL — ABNORMAL LOW (ref 30.0–36.0)
MCV: 87.7 fL (ref 80.0–100.0)
Monocytes Absolute: 0.6 10*3/uL (ref 0.1–1.0)
Monocytes Relative: 10 %
Neutro Abs: 3.2 10*3/uL (ref 1.7–7.7)
Neutrophils Relative %: 55 %
Platelets: 257 10*3/uL (ref 150–400)
RBC: 3.9 MIL/uL (ref 3.87–5.11)
RDW: 17.4 % — ABNORMAL HIGH (ref 11.5–15.5)
WBC: 5.9 10*3/uL (ref 4.0–10.5)
nRBC: 0 % (ref 0.0–0.2)

## 2021-06-10 LAB — FERRITIN: Ferritin: 1402 ng/mL — ABNORMAL HIGH (ref 11–307)

## 2021-06-10 LAB — COMPREHENSIVE METABOLIC PANEL
ALT: 29 U/L (ref 0–44)
AST: 22 U/L (ref 15–41)
Albumin: 3.9 g/dL (ref 3.5–5.0)
Alkaline Phosphatase: 104 U/L (ref 38–126)
Anion gap: 13 (ref 5–15)
BUN: 23 mg/dL (ref 8–23)
CO2: 26 mmol/L (ref 22–32)
Calcium: 9.6 mg/dL (ref 8.9–10.3)
Chloride: 103 mmol/L (ref 98–111)
Creatinine, Ser: 6.52 mg/dL — ABNORMAL HIGH (ref 0.44–1.00)
GFR, Estimated: 7 mL/min — ABNORMAL LOW (ref >60)
Glucose, Bld: 192 mg/dL — ABNORMAL HIGH (ref 70–99)
Potassium: 3.8 mmol/L (ref 3.5–5.1)
Sodium: 142 mmol/L (ref 135–145)
Total Bilirubin: 0.3 mg/dL (ref 0.3–1.2)
Total Protein: 7.4 g/dL (ref 6.5–8.1)

## 2021-06-10 LAB — IRON AND TIBC
Iron: 71 ug/dL (ref 28–170)
Saturation Ratios: 29 % (ref 10.4–31.8)
TIBC: 247 ug/dL — ABNORMAL LOW (ref 250–450)
UIBC: 176 ug/dL

## 2021-06-10 LAB — LACTATE DEHYDROGENASE: LDH: 263 U/L — ABNORMAL HIGH (ref 98–192)

## 2021-06-10 LAB — MAGNESIUM: Magnesium: 2.1 mg/dL (ref 1.7–2.4)

## 2021-06-10 MED ORDER — FLUTICASONE PROPIONATE 50 MCG/ACT NA SUSP: 2.0000 | Freq: Every day | NASAL | 6 refills | Status: DC

## 2021-06-10 MED ORDER — CYCLOBENZAPRINE HCL 5 MG PO TABS: ORAL_TABLET | ORAL | 0 refills | Status: DC

## 2021-06-10 MED ORDER — OFLOXACIN 0.3 % OT SOLN: 10.0000 [drp] | Freq: Every day | OTIC | 0 refills | Status: DC

## 2021-06-10 NOTE — Assessment & Plan Note (Signed)
She has not seen ENT states that the earliest we can see her  ?Has ongoing bilateral ear pain since over 3 months urgent referral placed to ENT today take.  Since pt has been experiencing ear pain for this long I will treat with ofloxacin . Rx ofloxacin otic solution, Place 10 drops into both ears daily for 14 days.,  ?

## 2021-06-10 NOTE — Patient Instructions (Addendum)
Please take flexeril '5mg'$  daily at bedtime as needed for your left shoulder pain continue your lyrica as ordered. Please call othopedics office if your pain does not get better. ?Use Flonase nasal spray, 2 spray into your nostril daily for your allergies  ? ? ?It is important that you exercise regularly at least 30 minutes 5 times a week.  ?Think about what you will eat, plan ahead. ?Choose " clean, green, fresh or frozen" over canned, processed or packaged foods which are more sugary, salty and fatty. ?70 to 75% of food eaten should be vegetables and fruit. ?Three meals at set times with snacks allowed between meals, but they must be fruit or vegetables. ?Aim to eat over a 12 hour period , example 7 am to 7 pm, and STOP after  your last meal of the day. ?Drink water,generally about 64 ounces per day, no other drink is as healthy. Fruit juice is best enjoyed in a healthy way, by EATING the fruit. ? ?Thanks for choosing [facility], we consider it a privelige to serve you. ? ?

## 2021-06-10 NOTE — Assessment & Plan Note (Signed)
Negative strep test ?Take Tylenol 650 mg every 6 hours as needed ?Drink warm water and fluids. ?

## 2021-06-10 NOTE — Progress Notes (Signed)
? ?  Kathryn Beck     MRN: [id number]      DOB: Sep 26, 1955 ? ? ?HPI ?Ms. Maggard with past medical history of bursitis of left shoulder, malignant hypertension, ischemic heart disease, sleep apnea, uncontrolled type 2 diabetes melitis back pain with left-sided radiculopathy end-stage renal disease on dialysis is here for c/o cold, sore throat,sneezing,, stuffy nose, running nose with clear colored drainage since that started 4 days ago. Pt denies fever, CP, wheezing , SOB.  ? ? ?Pt c/o bilateral ear pain since last year, she called her  ENT, she was told that they can not see her until May 2023. She states that she has tubes in both ears.  ? ?Pt c/o left sided chronic pain, pain got worse 4 days ago , has aching pain 9/10 she has been taking tylenol but its not helping.  Has chronic numbness and tingling on her right shoulder down to left side of her body.  ? ? ? ? ? ?ROS ?Denies recent fever or chills. ?Denies sinus pressure, nasal congestion, ear pain or sore throat. ?Denies chest congestion, productive cough or wheezing. ?Denies chest pains, palpitations and leg swelling ?Denies abdominal pain, nausea, vomiting,diarrhea or constipation.   ?Denies dysuria, frequency, hesitancy or incontinence. ?Denies headaches, seizures, numbness, or tingling. ?Denies depression, anxiety or insomnia. ?Denies skin break down or rash. ? ? ?PE ? ?BP (!) 174/60 (BP Location: Right Arm, Cuff Size: Large)  ? ?Patient alert and oriented and in no cardiopulmonary distress., ? ?HEENT: No facial asymmetry, EOMI,     Neck supple .  Bilateral tympanic membranes examined, non bulging , no redness noted, no drainage noted in the canals.  ? ?Chest: Clear to auscultation bilaterally. ? ?CVS: S1, S2 no murmurs, no S3.Regular rate. ? ?ABD: Soft non tender.  ? ?Ext: No edema ? ?MS: Adequate , reduced ROM spine, shoulders, hips and knees. ? ?Psych: Good eye contact, normal affect. Memory intact not anxious or depressed appearing. ? ? ?Assessment &  Plan ?Hypertension associated with diabetes ([place]) ?BP Readings from Last 3 Encounters:  ?06/10/21 (!) 180/90  ?05/02/21 128/72  ?03/13/21 (!) 212/101  ?BP elevated in the office today, patient states that she took her blood pressure medication about 30 minutes before coming for this appointment. ?States that she does have low blood pressure during dialysis.  No changes made to medications today need to take medication as prescribed discussed with patient she verbalized understanding. ?Continue clonidine 0.2 mg twice daily, irbesartan 150 mg daily, isosorbide mononitrate 30 mg daily torsemide 100 mg twice daily metoprolol 50 mg daily, amlodipine mg BID  ?Dash diet advised, engage in moderate exercises as tolerated  ? ?Sore throat ?Negative strep test ?Take Tylenol 650 mg every 6 hours as needed ?Drink warm water and fluids. ? ?Otalgia, bilateral ?She has not seen ENT states that the earliest we can see her  ?Has ongoing bilateral ear pain since over 3 months urgent referral placed to ENT today take.  Since pt has been experiencing ear pain for this long I will treat with ofloxacin . Rx ofloxacin otic solution, Place 10 drops into both ears daily for 14 days.,  ? ?Chronic left shoulder pain ?Followed by orthopedics had Depo-Medrol 40 mg injection 3 months ago. ?Takes Lyrica 50 mg 3 times daily, ?Rx Flexeril 5 mg at bedtime daily as needed. ?Patient told to follow-up with orthopedics if pain does not get better she verbalized understanding.  ? ?

## 2021-06-10 NOTE — Assessment & Plan Note (Signed)
BP Readings from Last 3 Encounters:  ?06/10/21 (!) 180/90  ?05/02/21 128/72  ?03/13/21 (!) 212/101  ?BP elevated in the office today, patient states that she took her blood pressure medication about 30 minutes before coming for this appointment. ?States that she does have low blood pressure during dialysis.  No changes made to medications today need to take medication as prescribed discussed with patient she verbalized understanding. ?Continue clonidine 0.2 mg twice daily, irbesartan 150 mg daily, isosorbide mononitrate 30 mg daily torsemide 100 mg twice daily metoprolol 50 mg daily, amlodipine mg BID  ?Dash diet advised, engage in moderate exercises as tolerated  ?

## 2021-06-10 NOTE — Assessment & Plan Note (Signed)
Followed by orthopedics had Depo-Medrol 40 mg injection 3 months ago. ?Takes Lyrica 50 mg 3 times daily, ?Rx Flexeril 5 mg at bedtime daily as needed. ?Patient told to follow-up with orthopedics if pain does not get better she verbalized understanding. ?

## 2021-06-11 DIAGNOSIS — N2581 Secondary hyperparathyroidism of renal origin: Secondary | ICD-10-CM | POA: Diagnosis not present

## 2021-06-11 DIAGNOSIS — Z992 Dependence on renal dialysis: Secondary | ICD-10-CM | POA: Diagnosis not present

## 2021-06-11 DIAGNOSIS — D509 Iron deficiency anemia, unspecified: Secondary | ICD-10-CM | POA: Diagnosis not present

## 2021-06-11 DIAGNOSIS — N186 End stage renal disease: Secondary | ICD-10-CM | POA: Diagnosis not present

## 2021-06-11 DIAGNOSIS — D631 Anemia in chronic kidney disease: Secondary | ICD-10-CM | POA: Diagnosis not present

## 2021-06-11 LAB — PROTEIN ELECTROPHORESIS, SERUM
A/G Ratio: 1.2 (ref 0.7–1.7)
Albumin ELP: 3.7 g/dL (ref 2.9–4.4)
Alpha-1-Globulin: 0.2 g/dL (ref 0.0–0.4)
Alpha-2-Globulin: 0.8 g/dL (ref 0.4–1.0)
Beta Globulin: 1.3 g/dL (ref 0.7–1.3)
Gamma Globulin: 0.9 g/dL (ref 0.4–1.8)
Globulin, Total: 3.1 g/dL (ref 2.2–3.9)
Total Protein ELP: 6.8 g/dL (ref 6.0–8.5)

## 2021-06-11 LAB — KAPPA/LAMBDA LIGHT CHAINS
Kappa free light chain: 226.1 mg/L — ABNORMAL HIGH (ref 3.3–19.4)
Kappa, lambda light chain ratio: 2.15 — ABNORMAL HIGH (ref 0.26–1.65)
Lambda free light chains: 105.2 mg/L — ABNORMAL HIGH (ref 5.7–26.3)

## 2021-06-11 LAB — POCT RAPID STREP A (OFFICE): Rapid Strep A Screen: NEGATIVE

## 2021-06-12 ENCOUNTER — Other Ambulatory Visit: Payer: Self-pay

## 2021-06-12 ENCOUNTER — Ambulatory Visit (HOSPITAL_COMMUNITY)
Admission: RE | Admit: 2021-06-12 | Discharge: 2021-06-12 | Disposition: A | Discharge status: Home / Self Care | Payer: Medicare Other | Source: Ambulatory Visit | Attending: Family Medicine | Admitting: Family Medicine

## 2021-06-12 DIAGNOSIS — Z1231 Encounter for screening mammogram for malignant neoplasm of breast: Secondary | ICD-10-CM | POA: Insufficient documentation

## 2021-06-13 ENCOUNTER — Ambulatory Visit: Payer: Medicare Other | Admitting: Internal Medicine

## 2021-06-13 ENCOUNTER — Ambulatory Visit (HOSPITAL_COMMUNITY): Payer: Medicare Other

## 2021-06-13 DIAGNOSIS — N2581 Secondary hyperparathyroidism of renal origin: Secondary | ICD-10-CM | POA: Diagnosis not present

## 2021-06-13 DIAGNOSIS — N186 End stage renal disease: Secondary | ICD-10-CM | POA: Diagnosis not present

## 2021-06-13 DIAGNOSIS — Z992 Dependence on renal dialysis: Secondary | ICD-10-CM | POA: Diagnosis not present

## 2021-06-13 DIAGNOSIS — D509 Iron deficiency anemia, unspecified: Secondary | ICD-10-CM | POA: Diagnosis not present

## 2021-06-13 DIAGNOSIS — D631 Anemia in chronic kidney disease: Secondary | ICD-10-CM | POA: Diagnosis not present

## 2021-06-13 LAB — IMMUNOFIXATION ELECTROPHORESIS
IgA: 345 mg/dL (ref 87–352)
IgG (Immunoglobin G), Serum: 964 mg/dL (ref 586–1602)
IgM (Immunoglobulin M), Srm: 59 mg/dL (ref 26–217)
Total Protein ELP: 6.7 g/dL (ref 6.0–8.5)

## 2021-06-16 ENCOUNTER — Other Ambulatory Visit: Payer: Self-pay

## 2021-06-16 ENCOUNTER — Telehealth: Payer: Self-pay

## 2021-06-16 ENCOUNTER — Emergency Department (HOSPITAL_COMMUNITY)
Admission: EM | Admit: 2021-06-16 | Discharge: 2021-06-16 | Disposition: A | Discharge status: Home / Self Care | Payer: Medicare Other | Attending: Emergency Medicine | Admitting: Emergency Medicine

## 2021-06-16 ENCOUNTER — Encounter (HOSPITAL_COMMUNITY): Payer: Self-pay

## 2021-06-16 DIAGNOSIS — H9203 Otalgia, bilateral: Secondary | ICD-10-CM | POA: Diagnosis not present

## 2021-06-16 DIAGNOSIS — N186 End stage renal disease: Secondary | ICD-10-CM | POA: Insufficient documentation

## 2021-06-16 DIAGNOSIS — D509 Iron deficiency anemia, unspecified: Secondary | ICD-10-CM | POA: Diagnosis not present

## 2021-06-16 DIAGNOSIS — Z794 Long term (current) use of insulin: Secondary | ICD-10-CM | POA: Diagnosis not present

## 2021-06-16 DIAGNOSIS — N2581 Secondary hyperparathyroidism of renal origin: Secondary | ICD-10-CM | POA: Diagnosis not present

## 2021-06-16 DIAGNOSIS — G8929 Other chronic pain: Secondary | ICD-10-CM

## 2021-06-16 DIAGNOSIS — Z7982 Long term (current) use of aspirin: Secondary | ICD-10-CM | POA: Diagnosis not present

## 2021-06-16 DIAGNOSIS — D631 Anemia in chronic kidney disease: Secondary | ICD-10-CM | POA: Diagnosis not present

## 2021-06-16 DIAGNOSIS — Z992 Dependence on renal dialysis: Secondary | ICD-10-CM | POA: Diagnosis not present

## 2021-06-16 DIAGNOSIS — I120 Hypertensive chronic kidney disease with stage 5 chronic kidney disease or end stage renal disease: Secondary | ICD-10-CM | POA: Diagnosis not present

## 2021-06-16 MED ORDER — HYDROCODONE-ACETAMINOPHEN 5-325 MG PO TABS: 1.0000 | ORAL_TABLET | Freq: Four times a day (QID) | ORAL | 0 refills | Status: DC | PRN

## 2021-06-16 MED ORDER — HYDROCODONE-ACETAMINOPHEN 5-325 MG PO TABS
1.0000 | ORAL_TABLET | Freq: Once | ORAL | Status: AC
  Administered 2021-06-16: 1 via ORAL
  Filled 2021-06-16: qty 1

## 2021-06-16 NOTE — ED Provider Notes (Signed)
?[facility] ?Provider Note ? ? ?CSN: [id number] ?Arrival date & time: 06/16/21  6440 ? ?  ? ?History ? ?Chief Complaint  ?Patient presents with  ? Otalgia  ? ? ?Kathryn Beck is a 66 y.o. female. ? ?Patient with a complaint of bilateral ear pain.  Was seen by primary care doctor for this on March 21.  And started on oral Floxin otic eardrops.  Patient states not any better.  Patient is a dialysis patient normally dialyzed Monday Wednesdays and Fridays.  She went to dialysis today and was dialyzed only partly but they felt it was good enough.  Patient states her ear pain is not any better.  Patient states she had tubes placed in her ears years ago by [facility] in [place] but she cannot member the name.  She has been trying to arrange follow-up with them.  Patient states that the pain has been worse the past few days. ? ? ?  ? ?Home Medications ?Prior to Admission medications   ?Medication Sig Start Date End Date Taking? Authorizing Provider  ?HYDROcodone-acetaminophen (NORCO/VICODIN) 5-325 MG tablet Take 1 tablet by mouth every 6 (six) hours as needed for moderate pain. 06/16/21  Yes Fredia Sorrow, MD  ?amLODipine (NORVASC) 5 MG tablet TAKE 1 TABLET BY MOUTH TWICE DAILY **DO NOT TAKE AM DOSE ON DIALYSIS DAYS** 06/02/21   Fayrene Helper, MD  ?aspirin 81 MG chewable tablet CHEW 2 TABLETS BY MOUTH ONCE DAILY 09/17/20   Fayrene Helper, MD  ?B Complex-C-Folic Acid (RENA-VITE RX) 1 MG TABS Take 1 tablet by mouth daily. 11/13/20   [provider]  ?calcitRIOL (ROCALTROL) 0.25 MCG capsule Take 0.25 capsules by mouth. 07/13/19   [provider]  ?cloNIDine (CATAPRES) 0.2 MG tablet Take 0.2 mg by mouth 2 (two) times daily. 11/13/20   [provider]  ?Continuous Blood Gluc Sensor (FREESTYLE LIBRE 2 SENSOR) MISC 1 each by Does not apply route every 14 (fourteen) days. 11/21/20   [name], MD  ?cyclobenzaprine (FLEXERIL) 5 MG tablet TAKE ONE TABLET BY  MOUTH AT BEDTIME AS NEEDED FOR MUSCLE SPASMS. 06/10/21   Renee Rival, FNP  ?DULoxetine (CYMBALTA) 60 MG capsule TAKE (1) CAPSULE BY MOUTH ONCE DAILY. 09/17/20   Fayrene Helper, MD  ?ezetimibe (ZETIA) 10 MG tablet TAKE 1 TABLET BY MOUTH ONCE DAILY. 11/14/20   Fayrene Helper, MD  ?fluticasone Asencion Islam) 50 MCG/ACT nasal spray Place 2 sprays into both nostrils daily. 06/10/21   Renee Rival, FNP  ?insulin glargine, 2 Unit Dial, (TOUJEO MAX SOLOSTAR) 300 UNIT/ML Solostar Pen Inject 50-56 Units into the skin See admin instructions. Take after dialysis 3 day a week. 05/02/21   [name], MD  ?insulin lispro (HUMALOG KWIKPEN) 200 UNIT/ML KwikPen Inject 18-30 Units into the skin daily. 05/02/21   [name], MD  ?Insulin Pen Needle 32G X 4 MM MISC Use 4x a day 05/02/21   [name], MD  ?irbesartan (AVAPRO) 150 MG tablet SMARTSIG:1 Tablet(s) By Mouth Every Evening 10/23/20   [provider]  ?isosorbide mononitrate (IMDUR) 30 MG 24 hr tablet TAKE 1 TABLET BY MOUTH ONCE DAILY. 11/14/20   Fayrene Helper, MD  ?levalbuterol Penne Lash) 1.25 MG/0.5ML nebulizer solution Take 1.25 mg by nebulization every 4 (four) hours as needed for wheezing or shortness of breath. 12/02/20   Varney Biles, MD  ?Melatonin 3 MG TBDP Take one tablet by mouth at bedtime , for sleep 10/23/20   Moshe Cipro,  Norwood Levo, MD  ?metoprolol succinate (TOPROL-XL) 50 MG 24 hr tablet TAKE (1) TABLET BY MOUTH DAILY WITH FOOD *TAKE AFTER DIALYSIS* 03/05/21   Fayrene Helper, MD  ?midodrine (PROAMATINE) 10 MG tablet TAKE 1 TABLET BY MOUTH TWICE DAILY 10/26/19   Fayrene Helper, MD  ?ofloxacin (FLOXIN) 0.3 % OTIC solution Place 10 drops into both ears daily for 14 days. 06/10/21 06/24/21  Renee Rival, FNP  ?ondansetron (ZOFRAN) 4 MG tablet Take 1 tablet (4 mg total) by mouth every 8 (eight) hours as needed for nausea or vomiting. 09/20/19   Fayrene Helper, MD  ?phenol (CHLORASEPTIC) 1.4 % LIQD Use as  directed 1 spray in the mouth or throat as needed for throat irritation / pain. 12/02/20   Noreene Larsson, NP  ?pregabalin (LYRICA) 50 MG capsule TAKE (1) CAPSULE BY MOUTH THREE TIMES DAILY 06/02/21   Carole Civil, MD  ?sertraline (ZOLOFT) 100 MG tablet TAKE 1 TABLET BY MOUTH EVERY DAY 10/16/20   Fayrene Helper, MD  ?sevelamer carbonate (RENVELA) 800 MG tablet Take by mouth. ?Patient not taking: Reported on 06/10/2021 06/02/21   [provider]  ?torsemide (DEMADEX) 100 MG tablet Take 1 tablet (100 mg total) by mouth 2 (two) times daily. On dialysis day (Monday, Wednesday, Friday) hold the morning dose - may take evening dose as planned.  All other days, may take twice a day as planned. 02/10/21   Verta Ellen., NP  ?triamcinolone cream (KENALOG) 0.1 % APPLY TO AFFECTED AREA TWICE A DAY 10/31/20   Lindell Spar, MD  ?[name] 500 MG chewable tablet Chew 500 mg by mouth See admin instructions. Take 500 mg with each snack and meal 11/07/18   [provider]  ?zolpidem (AMBIEN) 5 MG tablet TAKE 1 TABLET BY MOUTH AT BEDTIME AS NEEDED. 08/27/20   Fayrene Helper, MD  ?FLUoxetine (PROZAC) 10 MG capsule Take 10 mg by mouth daily.    05/28/11  [provider]  ?glipiZIDE (GLUCOTROL) 10 MG tablet Take 10 mg by mouth 2 (two) times daily before a meal.    05/28/11  [provider]  ?   ? ?Allergies    ?Ace inhibitors, Penicillins, Statins, and Albuterol   ? ?Review of Systems   ?Review of Systems  ?Constitutional:  Negative for chills and fever.  ?HENT:  Positive for ear pain. Negative for sore throat.   ?Eyes:  Negative for pain and visual disturbance.  ?Respiratory:  Negative for cough and shortness of breath.   ?Cardiovascular:  Negative for chest pain and palpitations.  ?Gastrointestinal:  Negative for abdominal pain and vomiting.  ?Genitourinary:  Negative for dysuria and hematuria.  ?Musculoskeletal:  Negative for arthralgias and back pain.  ?Skin:  Negative for color change  and rash.  ?Neurological:  Negative for seizures and syncope.  ?All other systems reviewed and are negative. ? ?Physical Exam ?Updated Vital Signs ?BP (!) 219/85 (BP Location: Right Arm)   Pulse 97   Temp 98.4 ?F (36.9 ?C) (Oral)   Resp 18   Ht 1.626 m ('5\' 4"'$ )   Wt 91.6 kg   SpO2 97%   BMI 34.67 kg/m?  ?Physical Exam ?Vitals and nursing note reviewed.  ?Constitutional:   ?   General: She is not in acute distress. ?   Appearance: Normal appearance. She is well-developed.  ?HENT:  ?   Head: Normocephalic and atraumatic.  ?   Right Ear: Tympanic membrane, ear canal and external ear  normal.  ?   Ears:  ?   Comments: Left ear canal narrowed.  Not able to adequately visualize the tympanic membrane on that side. ?Eyes:  ?   Conjunctiva/sclera: Conjunctivae normal.  ?   Pupils: Pupils are equal, round, and reactive to light.  ?Cardiovascular:  ?   Rate and Rhythm: Normal rate and regular rhythm.  ?   Heart sounds: No murmur heard. ?Pulmonary:  ?   Effort: Pulmonary effort is normal. No respiratory distress.  ?   Breath sounds: Normal breath sounds.  ?Abdominal:  ?   Palpations: Abdomen is soft.  ?   Tenderness: There is no abdominal tenderness.  ?Musculoskeletal:     ?   General: No swelling.  ?   Cervical back: Normal range of motion and neck supple.  ?   Comments: AV fistula with good thrill left upper extremity.  ?Skin: ?   General: Skin is warm and dry.  ?   Capillary Refill: Capillary refill takes less than 2 seconds.  ?Neurological:  ?   General: No focal deficit present.  ?   Mental Status: She is alert and oriented to person, place, and time.  ?Psychiatric:     ?   Mood and Affect: Mood normal.  ? ? ?ED Results / Procedures / Treatments   ?Labs ?(all labs ordered are listed, but only abnormal results are displayed) ?Labs Reviewed - No data to display ? ?EKG ?None ? ?Radiology ?No results found. ? ?Procedures ?Procedures  ? ? ?Medications Ordered in ED ?Medications  ?HYDROcodone-acetaminophen (NORCO/VICODIN)  5-325 MG per tablet 1 tablet (1 tablet Oral Given 06/16/21 1018)  ? ? ?ED Course/ Medical Decision Making/ A&P ?  ?                        ?Medical Decision Making ?Risk ?Prescription drug management. ? ? ?Bilateral

## 2021-06-16 NOTE — ED Triage Notes (Signed)
Patient complaining of bilateral earache, right worse than left. States that she has had pain for the past year and her PCP was booked until May.  ?

## 2021-06-16 NOTE — Telephone Encounter (Signed)
Patient daughter Kathryn Beck ?[phone] called asked nurse to please give her a call ?

## 2021-06-16 NOTE — Discharge Instructions (Signed)
Take the pain medication as directed.  Continue to use the eardrops.  Make an appointment follow-up with your primary care doctor.  And also make an appointment to follow-up with your ear nose and throat doctor.  Did give you the name of the person that is on call.  But it sounds like you have been followed by somebody in the past.  Continue dialysis as scheduled ?

## 2021-06-16 NOTE — Telephone Encounter (Signed)
Ebony Hail called from Dr Arnette Norris office needs to scheduled patient with another ENT.  She has no showed at their office 2 times and will not answer no calls.  Therefore will not see the patient. ?

## 2021-06-17 ENCOUNTER — Other Ambulatory Visit: Payer: Self-pay | Admitting: Internal Medicine

## 2021-06-17 ENCOUNTER — Other Ambulatory Visit (HOSPITAL_COMMUNITY): Payer: Medicare Other

## 2021-06-17 ENCOUNTER — Other Ambulatory Visit: Payer: Self-pay

## 2021-06-17 DIAGNOSIS — L309 Dermatitis, unspecified: Secondary | ICD-10-CM

## 2021-06-17 DIAGNOSIS — H9203 Otalgia, bilateral: Secondary | ICD-10-CM

## 2021-06-17 DIAGNOSIS — H60331 Swimmer's ear, right ear: Secondary | ICD-10-CM | POA: Insufficient documentation

## 2021-06-17 NOTE — Telephone Encounter (Signed)
Urgent referral entered for ENT Kathryn Beck will not see pt due to past no shows) Per daughter soonest available appt at any location  ?

## 2021-06-18 ENCOUNTER — Telehealth: Payer: Self-pay

## 2021-06-18 DIAGNOSIS — D509 Iron deficiency anemia, unspecified: Secondary | ICD-10-CM | POA: Diagnosis not present

## 2021-06-18 DIAGNOSIS — N186 End stage renal disease: Secondary | ICD-10-CM | POA: Diagnosis not present

## 2021-06-18 DIAGNOSIS — D631 Anemia in chronic kidney disease: Secondary | ICD-10-CM | POA: Diagnosis not present

## 2021-06-18 DIAGNOSIS — N2581 Secondary hyperparathyroidism of renal origin: Secondary | ICD-10-CM | POA: Diagnosis not present

## 2021-06-18 DIAGNOSIS — Z992 Dependence on renal dialysis: Secondary | ICD-10-CM | POA: Diagnosis not present

## 2021-06-18 NOTE — Telephone Encounter (Signed)
I tried to call the pt twice, her phone keeps dropping the calls, will call back later. ?

## 2021-06-18 NOTE — Telephone Encounter (Signed)
-----   Message from Verlon Au, NP sent at 06/17/2021 10:28 PM EDT ----- ?Please let patient know that bone scan did not show any concerning spots. Follow up as planned.  ?----- Message ----- ?From: Interface, Rad Results In ?Sent: 06/12/2021  11:54 PM EDT ?To: Verlon Au, NP ? ? ?

## 2021-06-20 DIAGNOSIS — N2581 Secondary hyperparathyroidism of renal origin: Secondary | ICD-10-CM | POA: Diagnosis not present

## 2021-06-20 DIAGNOSIS — D631 Anemia in chronic kidney disease: Secondary | ICD-10-CM | POA: Diagnosis not present

## 2021-06-20 DIAGNOSIS — D509 Iron deficiency anemia, unspecified: Secondary | ICD-10-CM | POA: Diagnosis not present

## 2021-06-20 DIAGNOSIS — N186 End stage renal disease: Secondary | ICD-10-CM | POA: Diagnosis not present

## 2021-06-20 DIAGNOSIS — Z992 Dependence on renal dialysis: Secondary | ICD-10-CM | POA: Diagnosis not present

## 2021-06-23 NOTE — Progress Notes (Deleted)
NO SHOW

## 2021-06-23 NOTE — Telephone Encounter (Signed)
Notified pt be message. ?

## 2021-06-24 ENCOUNTER — Other Ambulatory Visit: Payer: Self-pay | Admitting: Internal Medicine

## 2021-06-24 ENCOUNTER — Inpatient Hospital Stay (HOSPITAL_COMMUNITY): Payer: 59 | Admitting: Physician Assistant

## 2021-06-24 ENCOUNTER — Other Ambulatory Visit: Payer: Self-pay | Admitting: Nurse Practitioner

## 2021-06-24 DIAGNOSIS — H9203 Otalgia, bilateral: Secondary | ICD-10-CM

## 2021-06-25 ENCOUNTER — Telehealth: Payer: Self-pay

## 2021-06-25 NOTE — Telephone Encounter (Signed)
Pt called and requested a call back regarding pt's insulin. Pt's daughter Rosary Lively advised pharmacy unable to fill pt rx for insulin.  ?Called and confirmed with pharmacy that insurance is going to charge almost $1000 for insulin. Pt's daughter requesting samples until insurance issue is cleared up.  ?

## 2021-06-26 ENCOUNTER — Other Ambulatory Visit: Payer: Self-pay | Admitting: Orthopedic Surgery

## 2021-06-26 DIAGNOSIS — M545 Low back pain, unspecified: Secondary | ICD-10-CM

## 2021-06-30 ENCOUNTER — Telehealth: Payer: Self-pay

## 2021-06-30 NOTE — Telephone Encounter (Signed)
Patient called asking for another refill for ear drops, was suppose to be a 14 day supply only got enough drops out of the bottle for 4 days the bottle was so little.   ? ?ofloxacin (FLOXIN) 0.3 % OTIC solution  ? ?Pharmacy: [facility]. ? ?

## 2021-07-01 ENCOUNTER — Encounter (HOSPITAL_COMMUNITY): Payer: Self-pay | Admitting: Hematology

## 2021-07-01 ENCOUNTER — Other Ambulatory Visit: Payer: Self-pay

## 2021-07-01 DIAGNOSIS — H9203 Otalgia, bilateral: Secondary | ICD-10-CM

## 2021-07-01 MED ORDER — OFLOXACIN 0.3 % OT SOLN: 10.0000 [drp] | Freq: Every day | OTIC | 0 refills | Status: AC

## 2021-07-01 NOTE — Telephone Encounter (Signed)
Refills sent

## 2021-07-03 ENCOUNTER — Other Ambulatory Visit (HOSPITAL_COMMUNITY): Payer: Self-pay

## 2021-07-03 ENCOUNTER — Telehealth: Payer: Self-pay

## 2021-07-03 ENCOUNTER — Encounter (HOSPITAL_COMMUNITY): Payer: Self-pay | Admitting: Hematology

## 2021-07-03 DIAGNOSIS — E1122 Type 2 diabetes mellitus with diabetic chronic kidney disease: Secondary | ICD-10-CM

## 2021-07-03 NOTE — Telephone Encounter (Signed)
Patient Advocate Encounter ?  ?Received notification from patient calls that prior authorization for Humalog Claiborne Rigg is required by his/her insurance [employer]. ?  ?PA submitted on 07/03/21 ? ?Key#: [id number] ? ?Status is pending ?   ?[facility] will continue to follow: ? ?Patient Advocate ?Fax: [phone]  ?

## 2021-07-03 NOTE — Telephone Encounter (Signed)
Contacted pt's daughter and pt had some insulin left over but is currently needing refills. Pharmacy contacted to verify what was needed and was advised prescribed medication Humalog and Toujeo are not on insurance formulary. PA team notified to initiate an expedited PA. ?

## 2021-07-03 NOTE — Telephone Encounter (Signed)
Patient Advocate Encounter ? ?Prior Authorization for [employer] has been approved.   ? ?PA# [id number] ? ?Effective dates: 06/19/21 through 07/03/22 ? ?Per Test Claim Patients co-pay is $131.95. (28 DS) ? ?Spoke with Pharmacy to Process. ? ?Patient Advocate ?Fax: [phone]  ?

## 2021-07-04 ENCOUNTER — Other Ambulatory Visit: Payer: Self-pay

## 2021-07-04 MED ORDER — AMLODIPINE BESYLATE 5 MG PO TABS: ORAL_TABLET | ORAL | 0 refills | Status: DC

## 2021-07-04 MED ORDER — ISOSORBIDE MONONITRATE ER 30 MG PO TB24: 30.0000 mg | ORAL_TABLET | Freq: Every day | ORAL | 11 refills | Status: DC

## 2021-07-04 MED ORDER — METOPROLOL SUCCINATE ER 50 MG PO TB24: ORAL_TABLET | ORAL | 11 refills | Status: DC

## 2021-07-04 MED ORDER — SERTRALINE HCL 100 MG PO TABS: 100.0000 mg | ORAL_TABLET | Freq: Every day | ORAL | 2 refills | Status: DC

## 2021-07-04 NOTE — Telephone Encounter (Signed)
Patient Advocate Encounter ? ?Received notification from [facility] that the request for prior authorization for [name] has been denied due to the patient not trying the preferred med., Fiasp. ?  ? ?Specialty Pharmacy Patient Advocate ?Fax: [phone]  ?

## 2021-07-06 ENCOUNTER — Encounter (HOSPITAL_COMMUNITY): Payer: Self-pay

## 2021-07-06 ENCOUNTER — Emergency Department (HOSPITAL_COMMUNITY): Payer: Medicare Other

## 2021-07-06 ENCOUNTER — Emergency Department (HOSPITAL_COMMUNITY)
Admission: EM | Admit: 2021-07-06 | Discharge: 2021-07-06 | Disposition: A | Discharge status: Home / Self Care | Payer: Medicare Other | Attending: Emergency Medicine | Admitting: Emergency Medicine

## 2021-07-06 DIAGNOSIS — M7989 Other specified soft tissue disorders: Secondary | ICD-10-CM | POA: Insufficient documentation

## 2021-07-06 DIAGNOSIS — Z794 Long term (current) use of insulin: Secondary | ICD-10-CM | POA: Insufficient documentation

## 2021-07-06 DIAGNOSIS — Z7982 Long term (current) use of aspirin: Secondary | ICD-10-CM | POA: Insufficient documentation

## 2021-07-06 DIAGNOSIS — J40 Bronchitis, not specified as acute or chronic: Secondary | ICD-10-CM | POA: Insufficient documentation

## 2021-07-06 DIAGNOSIS — R944 Abnormal results of kidney function studies: Secondary | ICD-10-CM | POA: Diagnosis not present

## 2021-07-06 DIAGNOSIS — D649 Anemia, unspecified: Secondary | ICD-10-CM | POA: Insufficient documentation

## 2021-07-06 DIAGNOSIS — E1165 Type 2 diabetes mellitus with hyperglycemia: Secondary | ICD-10-CM | POA: Insufficient documentation

## 2021-07-06 DIAGNOSIS — Z7984 Long term (current) use of oral hypoglycemic drugs: Secondary | ICD-10-CM | POA: Diagnosis not present

## 2021-07-06 DIAGNOSIS — Z992 Dependence on renal dialysis: Secondary | ICD-10-CM | POA: Diagnosis not present

## 2021-07-06 DIAGNOSIS — Z20822 Contact with and (suspected) exposure to covid-19: Secondary | ICD-10-CM | POA: Diagnosis not present

## 2021-07-06 DIAGNOSIS — R0602 Shortness of breath: Secondary | ICD-10-CM | POA: Diagnosis present

## 2021-07-06 DIAGNOSIS — E1122 Type 2 diabetes mellitus with diabetic chronic kidney disease: Secondary | ICD-10-CM | POA: Diagnosis not present

## 2021-07-06 DIAGNOSIS — I120 Hypertensive chronic kidney disease with stage 5 chronic kidney disease or end stage renal disease: Secondary | ICD-10-CM | POA: Insufficient documentation

## 2021-07-06 DIAGNOSIS — R778 Other specified abnormalities of plasma proteins: Secondary | ICD-10-CM | POA: Insufficient documentation

## 2021-07-06 DIAGNOSIS — N186 End stage renal disease: Secondary | ICD-10-CM | POA: Insufficient documentation

## 2021-07-06 DIAGNOSIS — Z79899 Other long term (current) drug therapy: Secondary | ICD-10-CM | POA: Diagnosis not present

## 2021-07-06 LAB — CBC WITH DIFFERENTIAL/PLATELET
Abs Immature Granulocytes: 0.03 10*3/uL (ref 0.00–0.07)
Basophils Absolute: 0 10*3/uL (ref 0.0–0.1)
Basophils Relative: 1 %
Eosinophils Absolute: 0.1 10*3/uL (ref 0.0–0.5)
Eosinophils Relative: 1 %
HCT: 32.2 % — ABNORMAL LOW (ref 36.0–46.0)
Hemoglobin: 9.8 g/dL — ABNORMAL LOW (ref 12.0–15.0)
Immature Granulocytes: 0 %
Lymphocytes Relative: 18 %
Lymphs Abs: 1.3 10*3/uL (ref 0.7–4.0)
MCH: 24.9 pg — ABNORMAL LOW (ref 26.0–34.0)
MCHC: 30.4 g/dL (ref 30.0–36.0)
MCV: 81.9 fL (ref 80.0–100.0)
Monocytes Absolute: 0.7 10*3/uL (ref 0.1–1.0)
Monocytes Relative: 9 %
Neutro Abs: 5.1 10*3/uL (ref 1.7–7.7)
Neutrophils Relative %: 71 %
Platelets: 321 10*3/uL (ref 150–400)
RBC: 3.93 MIL/uL (ref 3.87–5.11)
RDW: 15.8 % — ABNORMAL HIGH (ref 11.5–15.5)
WBC: 7.2 10*3/uL (ref 4.0–10.5)
nRBC: 0 % (ref 0.0–0.2)

## 2021-07-06 LAB — COMPREHENSIVE METABOLIC PANEL
ALT: 44 U/L (ref 0–44)
AST: 32 U/L (ref 15–41)
Albumin: 3.8 g/dL (ref 3.5–5.0)
Alkaline Phosphatase: 109 U/L (ref 38–126)
Anion gap: 13 (ref 5–15)
BUN: 24 mg/dL — ABNORMAL HIGH (ref 8–23)
CO2: 26 mmol/L (ref 22–32)
Calcium: 10.1 mg/dL (ref 8.9–10.3)
Chloride: 102 mmol/L (ref 98–111)
Creatinine, Ser: 5.06 mg/dL — ABNORMAL HIGH (ref 0.44–1.00)
GFR, Estimated: 9 mL/min — ABNORMAL LOW (ref >60)
Glucose, Bld: 287 mg/dL — ABNORMAL HIGH (ref 70–99)
Potassium: 3.7 mmol/L (ref 3.5–5.1)
Sodium: 141 mmol/L (ref 135–145)
Total Bilirubin: 0.6 mg/dL (ref 0.3–1.2)
Total Protein: 7.9 g/dL (ref 6.5–8.1)

## 2021-07-06 LAB — BRAIN NATRIURETIC PEPTIDE: B Natriuretic Peptide: 521 pg/mL — ABNORMAL HIGH (ref 0.0–100.0)

## 2021-07-06 LAB — TROPONIN I (HIGH SENSITIVITY)
Troponin I (High Sensitivity): 21 ng/L — ABNORMAL HIGH (ref <18)
Troponin I (High Sensitivity): 20 ng/L — ABNORMAL HIGH (ref <18)

## 2021-07-06 LAB — RESP PANEL BY RT-PCR (FLU A&B, COVID) ARPGX2
Influenza A by PCR: NEGATIVE
Influenza B by PCR: NEGATIVE
SARS Coronavirus 2 by RT PCR: NEGATIVE

## 2021-07-06 LAB — MAGNESIUM: Magnesium: 2.1 mg/dL (ref 1.7–2.4)

## 2021-07-06 LAB — CBG MONITORING, ED: Glucose-Capillary: 282 mg/dL — ABNORMAL HIGH (ref 70–99)

## 2021-07-06 MED ORDER — CLONIDINE HCL 0.2 MG PO TABS
0.2000 mg | ORAL_TABLET | Freq: Once | ORAL | Status: AC
  Administered 2021-07-06: 0.2 mg via ORAL
  Filled 2021-07-06: qty 1

## 2021-07-06 MED ORDER — IPRATROPIUM-ALBUTEROL 0.5-2.5 (3) MG/3ML IN SOLN: 3.0000 mL | Freq: Once | RESPIRATORY_TRACT | Status: DC

## 2021-07-06 MED ORDER — IPRATROPIUM BROMIDE 0.02 % IN SOLN
0.5000 mg | Freq: Once | RESPIRATORY_TRACT | Status: AC
  Administered 2021-07-06: 0.5 mg via RESPIRATORY_TRACT
  Filled 2021-07-06: qty 2.5

## 2021-07-06 MED ORDER — PREDNISONE 10 MG PO TABS: 30.0000 mg | ORAL_TABLET | Freq: Every day | ORAL | 0 refills | Status: DC

## 2021-07-06 MED ORDER — INSULIN ASPART PROT & ASPART (70-30 MIX) 100 UNIT/ML ~~LOC~~ SUSP
2.0000 [IU] | Freq: Once | SUBCUTANEOUS | Status: AC
  Administered 2021-07-06: 2 [IU] via SUBCUTANEOUS
  Filled 2021-07-06: qty 10

## 2021-07-06 MED ORDER — TORSEMIDE 20 MG PO TABS
100.0000 mg | ORAL_TABLET | Freq: Every day | ORAL | Status: DC
  Administered 2021-07-06: 100 mg via ORAL
  Filled 2021-07-06: qty 5

## 2021-07-06 MED ORDER — AMLODIPINE BESYLATE 5 MG PO TABS
5.0000 mg | ORAL_TABLET | Freq: Once | ORAL | Status: AC
  Administered 2021-07-06: 5 mg via ORAL
  Filled 2021-07-06: qty 1

## 2021-07-06 MED ORDER — IPRATROPIUM BROMIDE HFA 17 MCG/ACT IN AERS
2.0000 | INHALATION_SPRAY | Freq: Once | RESPIRATORY_TRACT | Status: AC
  Administered 2021-07-06: 2 via RESPIRATORY_TRACT
  Filled 2021-07-06: qty 12.9

## 2021-07-06 MED ORDER — METOPROLOL TARTRATE 50 MG PO TABS
50.0000 mg | ORAL_TABLET | Freq: Once | ORAL | Status: AC
Start: 2021-07-06 — End: 2021-07-06
  Administered 2021-07-06: 50 mg via ORAL
  Filled 2021-07-06: qty 1

## 2021-07-06 NOTE — ED Triage Notes (Signed)
Pt c/o shortness of breath, wheezing, and generalized weakness that started 2-3 days ago. Reports recent double ear infection. ?

## 2021-07-06 NOTE — Discharge Instructions (Signed)
Likely a viral infection, I have started you on steroids please take as prescribed, obtain inhaler please use every 4-6 hours as needed for shortness of breath 1 to 2 puffs. ? ?Follow-up PCP for further evaluation. ? ?Come back to the emergency department if you develop chest pain, shortness of breath, severe abdominal pain, uncontrolled nausea, vomiting, diarrhea. ? ?

## 2021-07-06 NOTE — ED Notes (Signed)
Pt placed in gown ,put on heart monitor and EKG done ?

## 2021-07-06 NOTE — ED Notes (Signed)
Waiting on Novolog 70/30 to be delivered from pharmacy. ?

## 2021-07-06 NOTE — ED Provider Notes (Signed)
?[facility] ?Provider Note ? ? ?CSN: [id number] ?Arrival date & time: 07/06/21  7673 ? ?  ? ?History ? ?Chief Complaint  ?Patient presents with  ? Shortness of Breath  ? ? ?Kathryn Beck is a 66 y.o. female. ? ?HPI ? ?Patient with medical history including end-stage renal disease on dialysis Tuesday Thursday Saturday hypertension diabetes CP cardiac cath in 2020 presents with complaints of shortness of breath and wheezing.  He states this started about 2 days ago, states that she has subjective fevers and chills nasal congestion and a slight productive cough, denies any chest pain pleuritic chest pain, denies any stomach pains nausea vomiting diarrhea.  She states that she has noted some swelling in her legs but denies any orthopnea, states that she is missed one of her dialysis treatments this week but obtain will make up dialysis treatment,  had a normal treatment yesterday.  She denies any recent sick contacts, she up-to-date on her COVID influenza.  She has no other complaints. ? ?Reviewed patient's chart followed by cardiology, recently seen and ENT for otitis externa, as well as chronic ear infections. ? ?Husband at bedside able to validate the story ? ?Home Medications ?Prior to Admission medications   ?Medication Sig Start Date End Date Taking? Authorizing Provider  ?predniSONE (DELTASONE) 10 MG tablet Take 3 tablets (30 mg total) by mouth daily. 07/06/21  Yes Marcello Fennel, PA-C  ?amLODipine (NORVASC) 5 MG tablet TAKE 1 TABLET BY MOUTH TWICE DAILY **DO NOT TAKE AM DOSE ON DIALYSIS DAYS** ?Patient taking differently: Take 5 mg by mouth 2 (two) times daily. **DO NOT TAKE AM DOSE ON DIALYSIS DAYS** 07/04/21   Fayrene Helper, MD  ?aspirin 81 MG chewable tablet CHEW 2 TABLETS BY MOUTH ONCE DAILY ?Patient taking differently: Chew 162 mg by mouth daily. 09/17/20   Fayrene Helper, MD  ?B Complex-C-Folic Acid (RENA-VITE RX) 1 MG TABS Take 1 tablet by mouth daily. 11/13/20    [provider]  ?calcitRIOL (ROCALTROL) 0.25 MCG capsule Take 0.25 mcg by mouth daily. 07/13/19   [provider]  ?cloNIDine (CATAPRES) 0.2 MG tablet Take 0.2 mg by mouth 2 (two) times daily. 11/13/20   [provider]  ?Continuous Blood Gluc Sensor (FREESTYLE LIBRE 2 SENSOR) MISC 1 each by Does not apply route every 14 (fourteen) days. 11/21/20   [name], MD  ?cyclobenzaprine (FLEXERIL) 5 MG tablet TAKE ONE TABLET BY MOUTH AT BEDTIME AS NEEDED FOR MUSCLE SPASMS. ?Patient taking differently: Take 5 mg by mouth at bedtime as needed for muscle spasms. 06/10/21   Renee Rival, FNP  ?DULoxetine (CYMBALTA) 60 MG capsule TAKE (1) CAPSULE BY MOUTH ONCE DAILY. ?Patient taking differently: Take 60 mg by mouth daily. 09/17/20   Fayrene Helper, MD  ?ezetimibe (ZETIA) 10 MG tablet TAKE 1 TABLET BY MOUTH ONCE DAILY. ?Patient taking differently: Take 10 mg by mouth daily. 11/14/20   Fayrene Helper, MD  ?fluticasone Asencion Islam) 50 MCG/ACT nasal spray Place 2 sprays into both nostrils daily. 06/10/21   Renee Rival, FNP  ?HYDROcodone-acetaminophen (NORCO/VICODIN) 5-325 MG tablet Take 1 tablet by mouth every 6 (six) hours as needed for moderate pain. 06/16/21   Fredia Sorrow, MD  ?insulin lispro (HUMALOG KWIKPEN) 200 UNIT/ML KwikPen Inject 18-30 Units into the skin daily. 05/02/21   [name], MD  ?Insulin Pen Needle 32G X 4 MM MISC Use 4x a day 05/02/21   [name], MD  ?irbesartan (AVAPRO) 150 MG tablet  Take 150 mg by mouth every evening. 10/23/20   [provider]  ?isosorbide mononitrate (IMDUR) 30 MG 24 hr tablet Take 1 tablet (30 mg total) by mouth daily. 07/04/21   Fayrene Helper, MD  ?levalbuterol Penne Lash) 1.25 MG/0.5ML nebulizer solution Take 1.25 mg by nebulization every 4 (four) hours as needed for wheezing or shortness of breath. 12/02/20   Varney Biles, MD  ?Melatonin 3 MG TBDP Take one tablet by mouth at bedtime , for sleep ?Patient  taking differently: Take 3 mg by mouth at bedtime as needed (Sleep). 10/23/20   Fayrene Helper, MD  ?metoprolol succinate (TOPROL-XL) 50 MG 24 hr tablet TAKE (1) TABLET BY MOUTH DAILY WITH FOOD *TAKE AFTER DIALYSIS* ?Patient taking differently: Take 50 mg by mouth daily. *TAKE AFTER DIALYSIS* 07/04/21   Fayrene Helper, MD  ?midodrine (PROAMATINE) 10 MG tablet TAKE 1 TABLET BY MOUTH TWICE DAILY ?Patient taking differently: Take 10 mg by mouth 2 (two) times daily. 10/26/19   Fayrene Helper, MD  ?ofloxacin (FLOXIN) 0.3 % OTIC solution Place 10 drops into both ears daily for 14 days. 07/01/21 07/15/21  Renee Rival, FNP  ?ondansetron (ZOFRAN) 4 MG tablet Take 1 tablet (4 mg total) by mouth every 8 (eight) hours as needed for nausea or vomiting. 09/20/19   Fayrene Helper, MD  ?phenol (CHLORASEPTIC) 1.4 % LIQD Use as directed 1 spray in the mouth or throat as needed for throat irritation / pain. 12/02/20   Noreene Larsson, NP  ?pregabalin (LYRICA) 50 MG capsule TAKE (1) CAPSULE BY MOUTH THREE TIMES DAILY ?Patient taking differently: Take 50 mg by mouth 3 (three) times daily. 06/26/21   Carole Civil, MD  ?sertraline (ZOLOFT) 100 MG tablet Take 1 tablet (100 mg total) by mouth daily. 07/04/21   Fayrene Helper, MD  ?sevelamer carbonate (RENVELA) 800 MG tablet Take by mouth. ?Patient not taking: Reported on 06/10/2021 06/02/21   [provider]  ?torsemide (DEMADEX) 100 MG tablet Take 1 tablet (100 mg total) by mouth 2 (two) times daily. On dialysis day (Monday, Wednesday, Friday) hold the morning dose - may take evening dose as planned.  All other days, may take twice a day as planned. 02/10/21   Verta Ellen., NP  ?Lenon Curt SOLOSTAR 300 UNIT/ML Solostar Pen INJECT 50 TO 56 UNITS UNDER THE SKIN ONCE A DAY. TAKE AFTER DIALYSIS 3 DAYS A WEEK. ?Patient taking differently: Inject into the skin. Inject 50-56 units under the skin once a day. Take after dialysis 3 days a week 06/24/21    [name], MD  ?triamcinolone cream (KENALOG) 0.1 % APPLY TO AFFECTED AREA TWICE A DAY ?Patient taking differently: Apply 1 application. topically 2 (two) times daily. 06/17/21   Lindell Spar, MD  ?Orpah Greek 500 MG chewable tablet Chew 500 mg by mouth See admin instructions. Take 500 mg with each snack and meal 11/07/18   [provider]  ?zolpidem (AMBIEN) 5 MG tablet TAKE 1 TABLET BY MOUTH AT BEDTIME AS NEEDED. ?Patient taking differently: Take 5 mg by mouth at bedtime as needed for sleep. 08/27/20   Fayrene Helper, MD  ?FLUoxetine (PROZAC) 10 MG capsule Take 10 mg by mouth daily.    05/28/11  [provider]  ?glipiZIDE (GLUCOTROL) 10 MG tablet Take 10 mg by mouth 2 (two) times daily before a meal.    05/28/11  [provider]  ?   ? ?Allergies    ?Ace inhibitors, Penicillins,  Statins, and Albuterol   ? ?Review of Systems   ?Review of Systems  ?Constitutional:  Negative for chills and fever.  ?HENT:  Positive for congestion.   ?Respiratory:  Positive for cough, shortness of breath and wheezing.   ?Cardiovascular:  Negative for chest pain.  ?Gastrointestinal:  Negative for abdominal pain, diarrhea, nausea and vomiting.  ?Musculoskeletal:  Negative for myalgias.  ?Neurological:  Negative for headaches.  ? ?Physical Exam ?Updated Vital Signs ?BP (!) 158/64   Pulse 71   Temp 98.3 ?F (36.8 ?C) (Oral)   Resp 19   Ht '5\' 4"'$  (1.626 m)   Wt 89.8 kg   SpO2 94%   BMI 33.99 kg/m?  ?Physical Exam ?Vitals and nursing note reviewed.  ?Constitutional:   ?   General: She is not in acute distress. ?   Appearance: She is not ill-appearing.  ?HENT:  ?   Head: Normocephalic and atraumatic.  ?   Nose: No congestion.  ?   Mouth/Throat:  ?   Mouth: Mucous membranes are moist.  ?   Pharynx: Oropharynx is clear.  ?Eyes:  ?   Conjunctiva/sclera: Conjunctivae normal.  ?Cardiovascular:  ?   Rate and Rhythm: Normal rate and regular rhythm.  ?   Pulses: Normal pulses.  ?   Heart sounds: No murmur  heard. ?  No friction rub. No gallop.  ?Pulmonary:  ?   Effort: No respiratory distress.  ?   Breath sounds: Rales present. No wheezing or rhonchi.  ?   Comments: No evidence of respiratory distress, nontachypneic nonhypoxic, speaki

## 2021-07-07 NOTE — Telephone Encounter (Signed)
Okay to switch to [facility] units unit to unit.  She needs to inject Fiasp at the start of the meal, rather than 15 minutes before. ?

## 2021-07-08 ENCOUNTER — Encounter (HOSPITAL_COMMUNITY): Payer: Self-pay | Admitting: Hematology

## 2021-07-08 ENCOUNTER — Encounter: Payer: Self-pay | Admitting: Family Medicine

## 2021-07-08 ENCOUNTER — Telehealth: Payer: Self-pay | Admitting: *Deleted

## 2021-07-08 ENCOUNTER — Ambulatory Visit (INDEPENDENT_AMBULATORY_CARE_PROVIDER_SITE_OTHER): Payer: Medicare Other | Admitting: Family Medicine

## 2021-07-08 ENCOUNTER — Telehealth: Payer: Self-pay | Admitting: Family Medicine

## 2021-07-08 VITALS — BP 188/84 | HR 90 | Ht 64.0 in | Wt 195.1 lb

## 2021-07-08 DIAGNOSIS — E1159 Type 2 diabetes mellitus with other circulatory complications: Secondary | ICD-10-CM

## 2021-07-08 DIAGNOSIS — F322 Major depressive disorder, single episode, severe without psychotic features: Secondary | ICD-10-CM

## 2021-07-08 DIAGNOSIS — Z780 Asymptomatic menopausal state: Secondary | ICD-10-CM | POA: Diagnosis not present

## 2021-07-08 DIAGNOSIS — Z23 Encounter for immunization: Secondary | ICD-10-CM

## 2021-07-08 DIAGNOSIS — E1165 Type 2 diabetes mellitus with hyperglycemia: Secondary | ICD-10-CM | POA: Diagnosis not present

## 2021-07-08 DIAGNOSIS — F5104 Psychophysiologic insomnia: Secondary | ICD-10-CM

## 2021-07-08 DIAGNOSIS — I5032 Chronic diastolic (congestive) heart failure: Secondary | ICD-10-CM

## 2021-07-08 DIAGNOSIS — I152 Hypertension secondary to endocrine disorders: Secondary | ICD-10-CM

## 2021-07-08 DIAGNOSIS — Z794 Long term (current) use of insulin: Secondary | ICD-10-CM

## 2021-07-08 NOTE — Telephone Encounter (Signed)
Lvm making patient aware ?

## 2021-07-08 NOTE — Patient Instructions (Addendum)
Annual physical exam in office in 4 months, call if you need me sooner ? ?Pneumonia 20 in office today ? ? Pls schedule dexa at checkout. ? ?You are referred for eye exam, will be due in May ? ?Please get shingrix vaccines at your pharmacy ? ?Nurse pls refer to pharmacist for diabetic meds, not covered currently ? ?Thanks for choosing [facility], we consider it a privelige to serve you. ? ?

## 2021-07-08 NOTE — Telephone Encounter (Signed)
Forgot to ask you if she could start back driving. She said she is ready. Please advise  ?

## 2021-07-08 NOTE — Chronic Care Management (AMB) (Signed)
?  Care Management  ? ?Outreach Note ? ?07/08/2021 ?Name: Kathryn Beck MRN: [id number] DOB: 1955-12-15 ? ? ?Reason for referral : Advice Only ? ? ?A telephone outreach was attempted today spoke to patient about referral placed for medication assistance per Theda Sers patient needs to reach out to [facility] Endocrinology patient advocate to avoid duplication of support.  The patient was referred to the case management team for assistance with care management and care coordination. Referral was closed Dr. Moshe Cipro made aware.  ? ?Laverda Sorenson  ?Care Guide, Embedded Care Coordination ?[facility]  Care Management  ?Direct Dial: [phone]  ?

## 2021-07-10 MED ORDER — FIASP FLEXTOUCH 100 UNIT/ML ~~LOC~~ SOPN: 18.0000 [IU] | PEN_INJECTOR | Freq: Three times a day (TID) | SUBCUTANEOUS | 2 refills | Status: DC

## 2021-07-10 NOTE — Addendum Note (Signed)
Addended by: Lauralyn Primes on: 07/10/2021 01:58 PM ? ? Modules accepted: Orders ? ?

## 2021-07-10 NOTE — Telephone Encounter (Signed)
Rx sent to preferred pharmacy.

## 2021-07-13 ENCOUNTER — Encounter: Payer: Self-pay | Admitting: Family Medicine

## 2021-07-13 NOTE — Assessment & Plan Note (Signed)
Controlled, no change in medication  

## 2021-07-13 NOTE — Assessment & Plan Note (Signed)
Currently stable no s/s of decompensation ?

## 2021-07-13 NOTE — Assessment & Plan Note (Signed)
Ms. Shannahan is reminded of the importance of commitment to daily physical activity for 30 minutes or more, as able and the need to limit carbohydrate intake to 30 to 60 grams per meal to help with blood sugar control.  ? ?The need to take medication as prescribed, test blood sugar as directed, and to call between visits if there is a concern that blood sugar is uncontrolled is also discussed.  ? ?Ms. Eckstein is reminded of the importance of daily foot exam, annual eye examination, and good blood sugar, blood pressure and cholesterol control. ? ? ?  Latest Ref Rng & Units 07/06/2021  ?  9:35 AM 06/10/2021  ? 10:42 AM 05/02/2021  ?  1:17 PM 12/02/2020  ?  5:54 PM 11/21/2020  ?  2:02 PM  ?Diabetic Labs  ?HbA1c 4.0 - 5.6 %   11.4      ?Chol 0 - 200 mg/dL     190    ?HDL >39.00 mg/dL     43.10    ?Triglycerides <150 mg/dL    259   272.0    ?Creatinine 0.44 - 1.00 mg/dL 5.06   6.52    10.13     ? ? ?  07/08/2021  ?  9:35 AM 07/08/2021  ?  8:59 AM 07/06/2021  ?  1:20 PM 07/06/2021  ? 12:00 PM 07/06/2021  ? 11:00 AM 07/06/2021  ? 10:40 AM 07/06/2021  ?  9:20 AM  ?BP/Weight  ?Systolic BP 785 885 027 741 174 179 217  ?Diastolic BP 84 68 71 64 68 82 105  ?Wt. (Lbs)  195.12       ?BMI  33.49 kg/m2       ? ? ?  Latest Ref Rng & Units 03/06/2021  ?  2:00 PM 08/07/2020  ? 12:00 AM  ?Foot/eye exam completion dates  ?Eye Exam No Retinopathy  No Retinopathy       ?Foot Form Completion  Done   ?  ? This result is from an external source.  ? ? ? ? ?Managed by Endo, major challenge and currently uncontrolled , will also reach out to pharmacist for med assistance ?

## 2021-07-13 NOTE — Assessment & Plan Note (Signed)
Sleep hygiene reviewed and written information offered also. ?. ? ?

## 2021-07-13 NOTE — Assessment & Plan Note (Signed)
Uncontrolled , however repeatedly bottoms out at dialysis, Nephrology to treat ?No med change ?

## 2021-07-13 NOTE — Progress Notes (Signed)
? ?Kathryn Beck     MRN: [id number]      DOB: 1955-10-31 ? ? ?HPI ?Kathryn Beck is here for follow up and re-evaluation of chronic medical conditions, medication management and review of any available recent lab and radiology data.  ?Preventive health is updated, specifically  Cancer screening and Immunization.   ?Recently in the Ed for difficulty breathing which has improved ?The PT denies any adverse reactions to current medications since the last visit. Has difficulty getting diabetic meds states not covered and cot is prohibitive, endo office also aware. Blood sugar fluctuates, not controlled and removed from transplant list temporarily until blood sugar is controlled ?Blood sugar often bottoms out at dialysis deapite being very high at times, an ongoing challenge to get it controlled  ?ROS ?Denies recent fever or chills. ?Denies sinus pressure, nasal congestion, ear pain or sore throat. ?Denies chest congestion, productive cough or wheezing. ?Denies chest pains, palpitations and leg swelling ?Denies abdominal pain, nausea, vomiting,diarrhea or constipation.   ?Denies dysuria, frequency, hesitancy or incontinence. ?Denies  uncontrolled joint pain, swelling and limitation in mobility. ?Denies headaches, seizures, numbness, or tingling. ?Denies  uncontrolled depression, anxiety or insomnia. ?Denies skin break down or rash. ? ? ?PE ? ?BP (!) 188/84 Comment: pt bottoms out at dialysis  Pulse 90   Ht '5\' 4"'$  (1.626 m)   Wt 195 lb 1.9 oz (88.5 kg)   SpO2 (!) 88%   BMI 33.49 kg/m?  ? ?Patient alert and oriented and in no cardiopulmonary distress. ? ?HEENT: No facial asymmetry, EOMI,     Neck supple . ? ?Chest: Clear to auscultation bilaterally. ? ?CVS: S1, S2 , no S3.Regular rate. ? ?ABD: Soft non tender.  ? ?Ext: No edema ? ?MS: decreased  ROM spine, shoulders, hips and knees. ? ?Skin: Intact, no ulcerations or rash noted. ? ?Psych: Good eye contact, normal affect. Memory intact not anxious or depressed  appearing. ? ?CNS: CN 2-12 intact, power,  normal throughout.no focal deficits noted. ? ? ?Assessment & Plan ? 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  ? ?The need to take medication as prescribed, test blood sugar as directed, and to call between visits if there is a concern that blood sugar is uncontrolled is also discussed.  ? ?Kathryn Beck is reminded of the importance of daily foot exam, annual eye examination, and good blood sugar, blood pressure and cholesterol control. ? ? ?  Latest Ref Rng & Units 07/06/2021  ?  9:35 AM 06/10/2021  ? 10:42 AM 05/02/2021  ?  1:17 PM 12/02/2020  ?  5:54 PM 11/21/2020  ?  2:02 PM  ?Diabetic Labs  ?HbA1c 4.0 - 5.6 %   11.4      ?Chol 0 - 200 mg/dL     190    ?HDL >39.00 mg/dL     43.10    ?Triglycerides <150 mg/dL    259   272.0    ?Creatinine 0.44 - 1.00 mg/dL 5.06   6.52    10.13     ? ? ?  07/08/2021  ?  9:35 AM 07/08/2021  ?  8:59 AM 07/06/2021  ?  1:20 PM 07/06/2021  ? 12:00 PM 07/06/2021  ? 11:00 AM 07/06/2021  ?  10:40 AM 07/06/2021  ?  9:20 AM  ?BP/Weight  ?Systolic BP 128 786 767 209 174 179 217  ?Diastolic BP 84 68 71 64 68 82 105  ?Wt. (Lbs)  195.12       ?BMI  33.49 kg/m2       ? ? ?  Latest Ref Rng & Units 03/06/2021  ?  2:00 PM 08/07/2020  ? 12:00 AM  ?Foot/eye exam completion dates  ?Eye Exam No Retinopathy  No Retinopathy       ?Foot Form Completion  Done   ?  ? This result is from an external source.  ? ? ? ? ?Managed by Endo, major challenge and currently uncontrolled , will also reach out to pharmacist for med assistance ? ?Depression, major, single episode, severe ([place]) ?Controlled, no change in medication ? ? ?Chronic diastolic (congestive) heart failure ([facility]) ?Currently stable no s/s of  decompensation ? ?Insomnia ?Sleep hygiene reviewed and written information offered also. ?. ? ? ?

## 2021-07-20 DIAGNOSIS — Z992 Dependence on renal dialysis: Secondary | ICD-10-CM | POA: Diagnosis not present

## 2021-07-20 DIAGNOSIS — N186 End stage renal disease: Secondary | ICD-10-CM | POA: Diagnosis not present

## 2021-07-21 ENCOUNTER — Other Ambulatory Visit: Payer: Self-pay | Admitting: Internal Medicine

## 2021-07-21 DIAGNOSIS — Z992 Dependence on renal dialysis: Secondary | ICD-10-CM | POA: Diagnosis not present

## 2021-07-21 DIAGNOSIS — N2581 Secondary hyperparathyroidism of renal origin: Secondary | ICD-10-CM | POA: Diagnosis not present

## 2021-07-21 DIAGNOSIS — L309 Dermatitis, unspecified: Secondary | ICD-10-CM

## 2021-07-21 DIAGNOSIS — N186 End stage renal disease: Secondary | ICD-10-CM | POA: Diagnosis not present

## 2021-07-22 ENCOUNTER — Ambulatory Visit (INDEPENDENT_AMBULATORY_CARE_PROVIDER_SITE_OTHER): Payer: Medicare HMO | Admitting: Internal Medicine

## 2021-07-22 ENCOUNTER — Encounter: Payer: Self-pay | Admitting: Internal Medicine

## 2021-07-22 VITALS — BP 130/82 | Ht 64.0 in | Wt 198.4 lb

## 2021-07-22 DIAGNOSIS — E1122 Type 2 diabetes mellitus with diabetic chronic kidney disease: Secondary | ICD-10-CM | POA: Diagnosis not present

## 2021-07-22 DIAGNOSIS — D3500 Benign neoplasm of unspecified adrenal gland: Secondary | ICD-10-CM

## 2021-07-22 DIAGNOSIS — E782 Mixed hyperlipidemia: Secondary | ICD-10-CM

## 2021-07-22 DIAGNOSIS — N186 End stage renal disease: Secondary | ICD-10-CM

## 2021-07-22 LAB — POCT GLYCOSYLATED HEMOGLOBIN (HGB A1C): Hemoglobin A1C: 9.1 % — AB (ref 4.0–5.6)

## 2021-07-22 LAB — CORTISOL: Cortisol, Plasma: 24.7 ug/dL

## 2021-07-22 MED ORDER — TOUJEO MAX SOLOSTAR 300 UNIT/ML ~~LOC~~ SOPN: 60.0000 [IU] | PEN_INJECTOR | Freq: Every day | SUBCUTANEOUS | 3 refills | Status: DC

## 2021-07-22 MED ORDER — FREESTYLE LIBRE 2 READER DEVI: 1.0000 | Freq: Every day | 0 refills | Status: DC

## 2021-07-22 MED ORDER — FREESTYLE LIBRE 2 SENSOR MISC: 1.0000 | 3 refills | Status: DC

## 2021-07-22 MED ORDER — FIASP FLEXTOUCH 100 UNIT/ML ~~LOC~~ SOPN: 18.0000 [IU] | PEN_INJECTOR | Freq: Three times a day (TID) | SUBCUTANEOUS | 2 refills | Status: DC

## 2021-07-22 NOTE — Patient Instructions (Addendum)
Please resume: ?- Toujeo 40 units in the evening for the next 3 days, then increase to 60 units daily ?- Fiasp: ?20 units before a smaller meal ?24 units before a regular meal ?28 units before a larger meal ?Take the short acting insulin EVERY TIME you eat, 15 min before the meal, if sugars are >70. ? ?Please stop at the lab. ? ?Please do another salivary cortisol test at 11 pm the night before dialysis. ? ?Please return in 3 months.    ?

## 2021-07-22 NOTE — Progress Notes (Addendum)
Patient ID: Kathryn Beck, female   DOB: 1955-11-24, 66 y.o.   MRN: [id number]  This visit occurred during the SARS-CoV-2 public health emergency.  Safety protocols were in place, including screening questions prior to the visit, additional usage of staff PPE, and extensive cleaning of exam room while observing appropriate contact time as indicated for disinfecting solutions.   HPI: Kathryn Beck is a 66 y.o.-year-old female, initially referred by her PCP, Dr. Moshe Cipro, returning for follow-up for of DM2, dx in 2008, insulin-dependent since ~2015, uncontrolled, with complications (ESRD, PN). She saw Dr. Dorris Fetch - last OV with him 05/2016.  Last visit 2.5 months ago.  She is here with her daughter who offers part of the history especially related to medication doses, blood sugars, and other medical history.  Interim history: She was previously on peritoneal dialysis but changed to hemodialysis.  She was previously on the kidney transplant but not anymore and she needs better control of hypertension and diabetes. No increased urination, blurry vision, nausea, chest pain.  She has been off insulin for ~1 month!!  She tells me she changed her insurance starting yesterday and the new insurance should cover her insulins. She was not able to start the CGM.  Adrenal masses.  Reviewed previous imaging reports: 04/22/2018: MRI abd.: Stable 19 mm nodule in the left adrenal gland, unchanged since 2015, 19 mm otherwise negative.  05/28/2017: MRI abd: Bilateral adrenal nodules with signal dropout on out of phase imaging, consistent with adenomas.   Example at 2.5 cm on the left and on the order of 1.6 cm on the right.  10/10/2015: CT abdomen and pelvis without contrast: Stable 2.2 cm left adrenal nodule with Hounsfield unit measurements 28 unchanged likely a lipid poor adenoma  08/23/2008: MRI of the abdomen with and without contrast: Left adrenal mass measuring 1.6 x 1.5 cm (previously measuring 1.4 x 1.6 cm on  the CT scan from 2003 -stable in size),  Of note, she has an enlarging pancreatic head mass - pt reports a h/o pancreatitis. (This will need to be eval. By GI)  Reviewed previous adrenal biochemical investigation: Dexamethasone suppression test showed a low normal, nonsuppressed cortisol, but I suspect that this is due to her end-stage renal disease and dialysis status, rather than increased production. Component     Latest Ref Rng & Units 08/31/2018  Cortisol - AM     mcg/dL 6.1  Dexamethasone, Serum     ng/dL 446   A 24-hour urine cortisol could not be done - on dialysis.  We checked a late-night salivary cortisol as this was elevated: Component     Latest Ref Rng & Units 12/26/2020 Draw date/time: 12/22/20  midnight   Salivary Cortisol Baseline     ug/dL 0.637  Salivary Cortisol 2nd Specimen     ug/dL 0.634  Reference Range:  Children and Adults:  8:00a.m.:   0.025 - 0.600  Noon:      <0.010 - 0.330  4:00p.m.:   0.010 - 0.200  Midnight:  <0.010 - 0.090   Previous investigation was negative for hyperaldosteronism or pheochromocytoma: Component     Latest Ref Rng & Units 11/30/2017 10/04/2018 11/21/2020  Epinephrine     pg/mL Undetectable 24 <20  Norepinephrine     pg/mL 99 167 (L) 332  Dopamine     pg/mL Undetectable <10 <10  Catecholamines, Total     pg/mL 99    Total Catecholamines     pg/mL  191 (L) 332  Metanephrine,  Pl     <=57 pg/mL <25 <25 <25  Normetanephrine, Pl     <=148 pg/mL 71 38 56  Total Metanephrines-Plasma     <=205 pg/mL 71 38 56  ALDOSTERONE      ng/dL 3 2 <1  Renin Activity     0.25 - 5.82 ng/mL/h 1.20 1.48 0.28  ALDO / PRA Ratio      2.5 1.4 see note  Potassium     3.5 - 5.1 mEq/L 4.0 4.9 4.1    DM2: Reviewed HbA1c levels: Lab Results  Component Value Date   HGBA1C 11.4 (A) 05/02/2021   HGBA1C 9.1 (A) 10/28/2020   HGBA1C 8.4 (A) 06/18/2020  11/01/2020: HbA1c 9.3% 02/09/2020: HbA1c 6.8%  In the past, pt. was on a regimen of: -  Humalog 75/25 20-26 units before each meal, 2-3X a day depending on the CBG before the meal  - Levemir 100 units at bedtime  At last visit she was on: - Toujeo 54 >>  was only taking 34 units in the evening -but out for 2 months - Humalog >> on NovoLog 70/30 24-30 units 3-4x a day (!!!????) 24 units before a smaller meal 26-28 units before a regular meal 30 units before a larger meal Take Humalog EVERY TIME you eat, 15 min before the meal, if sugars are >70. - Humalog sliding scale: - 150-175: + 1 unit  - 176-200: + 2 units  - 201-225: + 3 units  - 226-250: + 4 units  - 251-275: + 5 units - 276-300: + 6 units  Try not to correct sugars at bedtime unless they are >300 and in that case, take no more than 4 units.  We changed to: - Toujeo 70 units in the evening - Fiasp: 20 units before a smaller meal 24 units before a regular meal 28 units before a larger meal Take Humalog EVERY TIME you eat, 15 min before the meal, if sugars are >70.  Pt was checking her sugars 2x a day: - am: 240-300s >> 186, 340 - 2h after b'fast: n/c - lunch: 240s >> n/c - 2h after lunch: n/c - dinner: 200-300s >> 486 - 2h after dinner: n/c >> 385 - bedtime: 300-HI >> 135, 265, 560, 595  Previously:   Lowest sugar was 31 ... >> 51 at waking up >> 42 >> 135 recently; it is unclear at which level she has hypoglycemia awareness. Highest sugar was 241 >> 500, then 300s >> HI >> 500s.  Glucometer: Accuchek  Pt's meals are: - Breakfast: eggs, bacon, oatmeal, grits - Lunch: salads - Dinner: meat + veggie or sandwich + salad or nabs - Snacks: nabs, fruit cups, jello  -+ ESRD, on dialysis: Lab Results  Component Value Date   BUN 24 (H) 07/06/2021   BUN 23 06/10/2021   CREATININE 5.06 (H) 07/06/2021   CREATININE 6.52 (H) 06/10/2021  Off olmesartan.  On irbesartan now.  -+ HL; last set of lipids: Lab Results  Component Value Date   CHOL 190 11/21/2020   HDL 43.10 11/21/2020   LDLCALC 114 (H)  03/08/2018   LDLDIRECT 120.0 11/21/2020   TRIG 259 (H) 12/02/2020   CHOLHDL 4 11/21/2020  Previously on Lipitor 40, now on Zetia due to transaminitis with statins.  - last eye exam was in 07/2020: no DR  -No numbness but she has tingling in her feet.  She is on Neurontin 300 mg 3 times a day.  Last foot exam 02/2021.  She also has  HTN. She has been in the hospital repeatedly in the past for pancreatitis.  ROS: + See HPI  I reviewed pt's medications, allergies, PMH, social hx, family hx, and changes were documented in the history of present illness. Otherwise, unchanged from my initial visit note.  Past Medical History:  Diagnosis Date   Acid reflux    Anemia    Arthritis    Axillary masses    Soft tissue - status post excision   Back pain    COVID-19 virus infection 04/06/2019   Depression    End-stage renal disease (HCC)    [facility]Sat  in [place] hypertension    History of blood transfusion    History of cardiac catheterization    Normal coronary arteries October 2020   History of claustrophobia    History of pneumonia 2019   Hypoxia 04/03/2019   Mixed hyperlipidemia    Obesity    Pancreatitis    Peritoneal dialysis catheter in place [facility])    Pneumonia due to COVID-19 virus 04/02/2019   Sleep apnea    Noncompliant with CPAP   Type 2 diabetes mellitus ([place])    Past Surgical History:  Procedure Laterality Date   ABDOMINAL HYSTERECTOMY     AV FISTULA PLACEMENT Left 09/02/2017   Procedure: creation of left arm ARTERIOVENOUS (AV) FISTULA;  Surgeon: Serafina Mitchell, MD;  Location: [place];  Service: Vascular;  Laterality: Left;   COLONOSCOPY  2008   Dr. Oneida Alar: normal    COLONOSCOPY N/A 12/18/2016   Dr. Oneida Alar: multiple tubular adenomas, internal hemorrhoids. Surveillance in 3 years    ESOPHAGEAL DILATION N/A 10/13/2015   Procedure: ESOPHAGEAL DILATION;  Surgeon: Rogene Houston, MD;  [facility];  Service: Endoscopy;  Laterality: N/A;    ESOPHAGOGASTRODUODENOSCOPY N/A 10/13/2015   Dr. Laural Golden: chronic gastritis on path, no H.pylori. Empiric dilation    ESOPHAGOGASTRODUODENOSCOPY N/A 12/18/2016   Dr. Oneida Alar: mild gastritis. BRAVO study revealed uncontrolled GERD. Dysphagia secondary to uncontrolled reflux   FOOT SURGERY Bilateral    "nerve"     LEFT HEART CATH AND CORONARY ANGIOGRAPHY N/A 12/29/2018   Procedure: LEFT HEART CATH AND CORONARY ANGIOGRAPHY;  Surgeon: Jettie Booze, MD;  Location: [place] [facility];  Service: Cardiovascular;  Laterality: N/A;   LUNG BIOPSY     MASS EXCISION Right 01/09/2013   Procedure: EXCISION OF NEOPLASM OF RIGHT  AXILLA  AND EXCISION OF NEOPLASM OF LEFT AXILLA;  Surgeon: Jamesetta So, MD;  Location: [facility];  Service: General;  Laterality: Right;  procedure end @ 08:23   MYRINGOTOMY WITH TUBE PLACEMENT Bilateral 04/28/2017   Procedure: BILATERAL MYRINGOTOMY WITH TUBE PLACEMENT;  Surgeon: Leta Baptist, MD;  Location: [place];  Service: ENT;  Laterality: Bilateral;   REVISION OF ARTERIOVENOUS GORETEX GRAFT Left 05/04/2018   Procedure: TRANSPOSITION OF CEPHALIC VEIN ARTERIOVENOUS FISTULA LEFT ARM;  Surgeon: Rosetta Posner, MD;  Location: [place];  Service: Vascular;  Laterality: Left;   SAVORY DILATION N/A 12/18/2016   Procedure: SAVORY DILATION;  Surgeon: Danie Binder, MD;  [facility];  Service: Endoscopy;  Laterality: N/A;   Social History   Socioeconomic History   Marital status: Married    Spouse name: Not on file   Number of children: 2  Occupational History   CNA  Tobacco Use   Smoking status: Never Smoker   Smokeless tobacco: Never Used  Substance and Sexual Activity   Alcohol use: No   Drug use: No  Current Outpatient Medications on File Prior to Visit  Medication Sig Dispense Refill   amLODipine (NORVASC) 5 MG tablet TAKE 1 TABLET BY MOUTH TWICE DAILY **DO NOT TAKE AM DOSE ON DIALYSIS DAYS** (Patient taking differently: Take 5 mg by mouth 2 (two) times daily. **DO NOT  TAKE AM DOSE ON DIALYSIS DAYS**) 56 tablet 0   aspirin 81 MG chewable tablet CHEW 2 TABLETS BY MOUTH ONCE DAILY (Patient taking differently: Chew 162 mg by mouth daily.) 56 tablet 11   B Complex-C-Folic Acid (RENA-VITE RX) 1 MG TABS Take 1 tablet by mouth daily.     calcitRIOL (ROCALTROL) 0.25 MCG capsule Take 0.25 mcg by mouth daily.     cloNIDine (CATAPRES) 0.2 MG tablet Take 0.2 mg by mouth 2 (two) times daily.     Continuous Blood Gluc Sensor (FREESTYLE LIBRE 2 SENSOR) MISC 1 each by Does not apply route every 14 (fourteen) days. 6 each 3   cyclobenzaprine (FLEXERIL) 5 MG tablet TAKE ONE TABLET BY MOUTH AT BEDTIME AS NEEDED FOR MUSCLE SPASMS. (Patient taking differently: Take 5 mg by mouth at bedtime as needed for muscle spasms.) 20 tablet 0   DULoxetine (CYMBALTA) 60 MG capsule TAKE (1) CAPSULE BY MOUTH ONCE DAILY. (Patient taking differently: Take 60 mg by mouth daily.) 28 capsule 11   ezetimibe (ZETIA) 10 MG tablet TAKE 1 TABLET BY MOUTH ONCE DAILY. (Patient taking differently: Take 10 mg by mouth daily.) 28 tablet 11   fluticasone (FLONASE) 50 MCG/ACT nasal spray Place 2 sprays into both nostrils daily. 16 g 6   HYDROcodone-acetaminophen (NORCO/VICODIN) 5-325 MG tablet Take 1 tablet by mouth every 6 (six) hours as needed for moderate pain. 16 tablet 0   insulin aspart (FIASP FLEXTOUCH) 100 UNIT/ML FlexTouch Pen Inject 18-30 Units into the skin with breakfast, with lunch, and with evening meal. 45 mL 2   Insulin Pen Needle 32G X 4 MM MISC Use 4x a day 300 each 3   irbesartan (AVAPRO) 150 MG tablet Take 150 mg by mouth every evening.     isosorbide mononitrate (IMDUR) 30 MG 24 hr tablet Take 1 tablet (30 mg total) by mouth daily. 28 tablet 11   levalbuterol (XOPENEX) 1.25 MG/0.5ML nebulizer solution Take 1.25 mg by nebulization every 4 (four) hours as needed for wheezing or shortness of breath. 1 each 0   Melatonin 3 MG TBDP Take one tablet by mouth at bedtime , for sleep (Patient taking  differently: Take 3 mg by mouth at bedtime as needed (Sleep).) 30 tablet 2   metoprolol succinate (TOPROL-XL) 50 MG 24 hr tablet TAKE (1) TABLET BY MOUTH DAILY WITH FOOD *TAKE AFTER DIALYSIS* (Patient taking differently: Take 50 mg by mouth daily. *TAKE AFTER DIALYSIS*) 28 tablet 11   midodrine (PROAMATINE) 10 MG tablet TAKE 1 TABLET BY MOUTH TWICE DAILY (Patient taking differently: Take 10 mg by mouth 2 (two) times daily.) 56 tablet 5   ondansetron (ZOFRAN) 4 MG tablet Take 1 tablet (4 mg total) by mouth every 8 (eight) hours as needed for nausea or vomiting. 20 tablet 0   predniSONE (DELTASONE) 10 MG tablet Take 3 tablets (30 mg total) by mouth daily. 15 tablet 0   pregabalin (LYRICA) 50 MG capsule TAKE (1) CAPSULE BY MOUTH THREE TIMES DAILY (Patient taking differently: Take 50 mg by mouth 3 (three) times daily.) 84 capsule 5   sertraline (ZOLOFT) 100 MG tablet Take 1 tablet (100 mg total) by mouth daily. 90 tablet 2   sevelamer carbonate (  RENVELA) 800 MG tablet Take by mouth. (Patient not taking: Reported on 06/10/2021)     torsemide (DEMADEX) 100 MG tablet Take 1 tablet (100 mg total) by mouth 2 (two) times daily. On dialysis day (Monday, Wednesday, Friday) hold the morning dose - may take evening dose as planned.  All other days, may take twice a day as planned.     TOUJEO MAX SOLOSTAR 300 UNIT/ML Solostar Pen INJECT 50 TO 56 UNITS UNDER THE SKIN ONCE A DAY. TAKE AFTER DIALYSIS 3 DAYS A WEEK. (Patient taking differently: Inject into the skin. Inject 50-56 units under the skin once a day. Take after dialysis 3 days a week) 6 mL 0   triamcinolone cream (KENALOG) 0.1 % APPLY TO AFFECTED AREA TWICE A DAY 30 g 0   [name] 500 MG chewable tablet Chew 500 mg by mouth See admin instructions. Take 500 mg with each snack and meal     zolpidem (AMBIEN) 5 MG tablet TAKE 1 TABLET BY MOUTH AT BEDTIME AS NEEDED. (Patient taking differently: Take 5 mg by mouth at bedtime as needed for sleep.) 30 tablet 0    [DISCONTINUED] FLUoxetine (PROZAC) 10 MG capsule Take 10 mg by mouth daily.       [DISCONTINUED] glipiZIDE (GLUCOTROL) 10 MG tablet Take 10 mg by mouth 2 (two) times daily before a meal.       Current Facility-Administered Medications on File Prior to Visit  Medication Dose Route Frequency Provider Last Rate Last Admin   0.9 %  sodium chloride infusion   Intravenous Continuous Lockamy, Randi L, NP-C 20 mL/hr at 07/19/19 1641 Rate Verify at 07/19/19 1641   Allergies  Allergen Reactions   Ace Inhibitors Anaphylaxis and Swelling   Penicillins Itching, Swelling and Other (See Comments)    Did it involve swelling of the face/tongue/throat, SOB, or low BP? Unknown Did it involve sudden or severe rash/hives, skin peeling, or any reaction on the inside of your mouth or nose? Unknown Did you need to seek medical attention at a hospital or doctor's office? Unknown When did it last happen?      years  If all above answers are "NO", may proceed with cephalosporin use.    Statins Other (See Comments)    elevated LFT's     Albuterol Swelling   Family History  Problem Relation Age of Onset   Hypertension Father    Hypercholesterolemia Father    Arthritis Father    Hypertension Sister    Hypercholesterolemia Sister    Breast cancer Sister    Hypertension Sister    Colon cancer Neg Hx    Colon polyps Neg Hx    PE: BP 130/82 (BP Location: Right Arm, Patient Position: Sitting, Cuff Size: Normal)   Ht '5\' 4"'$  (1.626 m)   Wt 198 lb 6.4 oz (90 kg)   BMI 34.06 kg/m  Wt Readings from Last 3 Encounters:  07/22/21 198 lb 6.4 oz (90 kg)  07/08/21 195 lb 1.9 oz (88.5 kg)  07/06/21 198 lb (89.8 kg)   Constitutional: overweight, in NAD, moon facies, truncal obesity Eyes: PERRLA, EOMI, no exophthalmos ENT: moist mucous membranes, no thyromegaly, no cervical lymphadenopathy Cardiovascular: RRR, No RG, +1/6 SEM, + bilateral leg swelling, pitting Respiratory: CTA B Musculoskeletal: no deformities,  strength intact in all 4 Skin: moist, warm, + dark macular rash and dry skin on her chest and neck Neurological: no tremor with outstretched hands, DTR normal in all 4  ASSESSMENT: 1. DM2, insulin-dependent, uncontrolled, with complications -  CKD stage 4 - PN  2. HL  3. B adrenal adenomas  PLAN:  1. Patient with longstanding, uncontrolled, type 2 diabetes, previously on a premixed insulin regimen, which we changed to a basal/bolus insulin regimen at last visit.  At last visit, HbA1c was very high, at 11.4%.  At that time, she just restarted insulin, after being off for 2 months.  Toujeo and [place] were recently approved for her.  However, she still has problems obtaining them due to price.  She was taken off the transplant list because of high CBGs.  At last visit, I recommended to start the freestyle libre CGM.  She was on this in the past and finally obtained it again before our last visit but she did not attach it yet.  At today's visit, however, she does not have it, as she mentioned that this was not covered.  I sent this again to the pharmacy, hoping that it is covered under her new insurance. -Also, she mentions her insulins were not covered and she used her husband's long-acting insulin.  However, she is now off insulin for at least 1 month, reportedly, but it is difficult to extract accurate information from the patient as she cannot remember exactly. - As of now, sugars are very high, with many values in the 300s and even 500s.  We have to restart insulin right away.  We gave her samples of short and long-acting insulin and also sent new prescriptions for Toujeo and Fiasp to her pharmacy.  We will start at a lower dose of Toujeo and increase as needed.  -Her HbA1c is better than before, despite being off the insulin for so long.  This is a good sign, meaning that if she is able to stay on insulin consistently, we would be able to control her diabetes. -She lost 7 pounds since last visit -  I suggested to:  Patient Instructions  Please resume: - Toujeo 40 units in the evening for the next 3 days, then increase to 60 units daily - Fiasp: 20 units before a smaller meal 24 units before a regular meal 28 units before a larger meal Take the short acting insulin EVERY TIME you eat, 15 min before the meal, if sugars are >70.  Please stop at the lab.  Please do another salivary cortisol test at 11 pm the night before dialysis.  Please return in 3 months.      - we checked her HbA1c: 9.1% (lower)  - advised to check sugars at different times of the day - 4x a day, rotating check times - advised for yearly eye exams >> she is UTD - return to clinic in 3 months  2. HL -Reviewed latest lipid panel from 11/2020: LDL above target, triglycerides high: Lab Results  Component Value Date   CHOL 190 11/21/2020   HDL 43.10 11/21/2020   LDLCALC 114 (H) 03/08/2018   LDLDIRECT 120.0 11/21/2020   TRIG 259 (H) 12/02/2020   CHOLHDL 4 11/21/2020  -She continues on Zetia 10 mg daily without side effects.  She previously had transaminitis  3.  Bilateral adrenal adenoma -Patient with history of bilateral adrenal adenomas per review of her abdominal MRI from 2019.  Imaging characteristics pointed towards benign tumors, of 2.5 and 1.6 cm, respectively.  However, they have appeared to increase over time.  Reviewing the MRI of the abdomen from 2010 and the CT of the abdomen and pelvis from 2017, only 1 nodule was seen in both, in the  left adrenal.  She had another MRI in 03/2018 which showed a stable left 1.9 cm adrenal nodule. -We investigated her adrenal status.  Plasma catecholamines, metanephrines, and aldosterone  were normal between 2019-2022.  Norepinephrine was slightly elevated (nonspecific) 09/2018. -The investigation of her cortisol status is difficult due to being on dialysis. These patients usually have higher cortisol levels due to increased inflammation but not necessarily increase  cortisol production.  Therefore, we checked a dexamethasone suppression test that showed an elevated cortisol.  Her midnight salivary cortisol x2 was also elevated, however, this could be influenced by the uncontrolled diabetes.  Therefore, it was difficult to make a diagnosis of hyperproductive hypercortisolism and treat her for this, especially since she has bilateral adrenal masses, and even if hormone overproduction is established, precise localization would not be possible.  Therefore, for now, we will continue to follow her for this. -At today's visit, we discussed about repeating another salivary cortisol level but this should be done the night prior to dialysis.  She does not have a late night chronorhythm (which can cause a higher cortisol at 11 PM) goes to bed around 9 PM.  In the days of dialysis, she has to wake up at 3 AM.  She has today's appointment was scheduled in the morning, between 8 and 9, we can check a DHEA-S today along with a cortisol and ACTH.  Orders Placed This Encounter  Procedures   DHEA-Sulfate, Serum   ACTH   Cortisol   Salivary Cortisol X2, Timed   POCT glycosylated hemoglobin (Hb A1C)     Component     Latest Ref Rng 07/22/2021  Salivary Cortisol Baseline     ug/dL CANCELED    Cortisol, Plasma ug/dL 24.7   Comment: AM:  4.3 - 22.4 ug/dLPM:  3.1 - 16.7 ug/dL  Resulting Agency  [facility]     C206 ACTH 6 - 50 pg/mL 85 High    Comment: .  Reference range applies only to specimens collected  between 7am-10am.  .   Resulting Agency  [facility]   DHEA-Sulfate, LCMS ug/dL 91   Comment: This test was developed and its performance characteristics  determined by [facility]. It has not been cleared or approved  by the Food and Drug Administration.  Reference Range:  Adult Females (61 - 70y): <128   Resulting Agency  [facility]   ACTH and cortisol are elevated.  DHEA-S is normal.  Unfortunately, the salivary cortisol was canceled.   Patient will repeat this.  If it is elevated, may need to proceed with a pituitary MRI.  [name], MD PhD [facility]

## 2021-07-23 DIAGNOSIS — N2581 Secondary hyperparathyroidism of renal origin: Secondary | ICD-10-CM | POA: Diagnosis not present

## 2021-07-23 DIAGNOSIS — N186 End stage renal disease: Secondary | ICD-10-CM | POA: Diagnosis not present

## 2021-07-23 DIAGNOSIS — Z992 Dependence on renal dialysis: Secondary | ICD-10-CM | POA: Diagnosis not present

## 2021-07-24 ENCOUNTER — Other Ambulatory Visit: Payer: Self-pay | Admitting: Family Medicine

## 2021-07-24 LAB — HM DIABETES EYE EXAM

## 2021-07-25 ENCOUNTER — Other Ambulatory Visit: Payer: Self-pay | Admitting: Family Medicine

## 2021-07-25 DIAGNOSIS — N2581 Secondary hyperparathyroidism of renal origin: Secondary | ICD-10-CM | POA: Diagnosis not present

## 2021-07-25 DIAGNOSIS — N186 End stage renal disease: Secondary | ICD-10-CM | POA: Diagnosis not present

## 2021-07-25 DIAGNOSIS — Z992 Dependence on renal dialysis: Secondary | ICD-10-CM | POA: Diagnosis not present

## 2021-07-27 LAB — ACTH: C206 ACTH: 85 pg/mL — ABNORMAL HIGH (ref 6–50)

## 2021-07-28 DIAGNOSIS — Z992 Dependence on renal dialysis: Secondary | ICD-10-CM | POA: Diagnosis not present

## 2021-07-28 DIAGNOSIS — N2581 Secondary hyperparathyroidism of renal origin: Secondary | ICD-10-CM | POA: Diagnosis not present

## 2021-07-28 DIAGNOSIS — N186 End stage renal disease: Secondary | ICD-10-CM | POA: Diagnosis not present

## 2021-07-29 ENCOUNTER — Other Ambulatory Visit: Payer: Self-pay

## 2021-07-29 LAB — DHEA-SULFATE, SERUM: DHEA-Sulfate, LCMS: 91 ug/dL

## 2021-07-29 LAB — SALIVARY CORTISOL X2, TIMED

## 2021-07-29 MED ORDER — METOPROLOL SUCCINATE ER 50 MG PO TB24: ORAL_TABLET | ORAL | 11 refills | Status: DC

## 2021-07-29 MED ORDER — SERTRALINE HCL 100 MG PO TABS: 100.0000 mg | ORAL_TABLET | Freq: Every day | ORAL | 2 refills | Status: DC

## 2021-07-29 MED ORDER — ISOSORBIDE MONONITRATE ER 30 MG PO TB24: 30.0000 mg | ORAL_TABLET | Freq: Every day | ORAL | 11 refills | Status: DC

## 2021-07-29 MED ORDER — AMLODIPINE BESYLATE 5 MG PO TABS: ORAL_TABLET | ORAL | 11 refills | Status: DC

## 2021-07-30 ENCOUNTER — Other Ambulatory Visit: Payer: Self-pay

## 2021-07-30 DIAGNOSIS — N2581 Secondary hyperparathyroidism of renal origin: Secondary | ICD-10-CM | POA: Diagnosis not present

## 2021-07-30 DIAGNOSIS — Z992 Dependence on renal dialysis: Secondary | ICD-10-CM | POA: Diagnosis not present

## 2021-07-30 DIAGNOSIS — N186 End stage renal disease: Secondary | ICD-10-CM | POA: Diagnosis not present

## 2021-08-01 ENCOUNTER — Ambulatory Visit: Payer: Medicare Other | Admitting: Internal Medicine

## 2021-08-01 DIAGNOSIS — N2581 Secondary hyperparathyroidism of renal origin: Secondary | ICD-10-CM | POA: Diagnosis not present

## 2021-08-01 DIAGNOSIS — N186 End stage renal disease: Secondary | ICD-10-CM | POA: Diagnosis not present

## 2021-08-01 DIAGNOSIS — Z992 Dependence on renal dialysis: Secondary | ICD-10-CM | POA: Diagnosis not present

## 2021-08-04 DIAGNOSIS — Z992 Dependence on renal dialysis: Secondary | ICD-10-CM | POA: Diagnosis not present

## 2021-08-04 DIAGNOSIS — N186 End stage renal disease: Secondary | ICD-10-CM | POA: Diagnosis not present

## 2021-08-04 DIAGNOSIS — N2581 Secondary hyperparathyroidism of renal origin: Secondary | ICD-10-CM | POA: Diagnosis not present

## 2021-08-06 DIAGNOSIS — N186 End stage renal disease: Secondary | ICD-10-CM | POA: Diagnosis not present

## 2021-08-06 DIAGNOSIS — Z992 Dependence on renal dialysis: Secondary | ICD-10-CM | POA: Diagnosis not present

## 2021-08-06 DIAGNOSIS — N2581 Secondary hyperparathyroidism of renal origin: Secondary | ICD-10-CM | POA: Diagnosis not present

## 2021-08-08 DIAGNOSIS — N2581 Secondary hyperparathyroidism of renal origin: Secondary | ICD-10-CM | POA: Diagnosis not present

## 2021-08-08 DIAGNOSIS — Z992 Dependence on renal dialysis: Secondary | ICD-10-CM | POA: Diagnosis not present

## 2021-08-08 DIAGNOSIS — N186 End stage renal disease: Secondary | ICD-10-CM | POA: Diagnosis not present

## 2021-08-11 ENCOUNTER — Telehealth: Payer: Self-pay

## 2021-08-11 ENCOUNTER — Other Ambulatory Visit: Payer: Self-pay

## 2021-08-11 DIAGNOSIS — Z992 Dependence on renal dialysis: Secondary | ICD-10-CM | POA: Diagnosis not present

## 2021-08-11 DIAGNOSIS — N2581 Secondary hyperparathyroidism of renal origin: Secondary | ICD-10-CM | POA: Diagnosis not present

## 2021-08-11 DIAGNOSIS — N186 End stage renal disease: Secondary | ICD-10-CM | POA: Diagnosis not present

## 2021-08-11 MED ORDER — HYDROCODONE-ACETAMINOPHEN 5-325 MG PO TABS: 1.0000 | ORAL_TABLET | Freq: Four times a day (QID) | ORAL | 0 refills | Status: DC | PRN

## 2021-08-11 NOTE — Telephone Encounter (Signed)
Patient is asking for a refill of Hydrocodone-Acetaminophen. She hasn't been here since December 2022 and I see where she got some back in March from another doctor. I called her and told her that she hasn't been seen since 12/22 and that she may need to be seen before this request can be done. She stated to go ahead and see if Dr. Aline Brochure will send it to [facility]

## 2021-08-11 NOTE — Telephone Encounter (Signed)
Request sent to provider.

## 2021-08-13 DIAGNOSIS — N186 End stage renal disease: Secondary | ICD-10-CM | POA: Diagnosis not present

## 2021-08-13 DIAGNOSIS — N2581 Secondary hyperparathyroidism of renal origin: Secondary | ICD-10-CM | POA: Diagnosis not present

## 2021-08-13 DIAGNOSIS — Z992 Dependence on renal dialysis: Secondary | ICD-10-CM | POA: Diagnosis not present

## 2021-08-14 ENCOUNTER — Other Ambulatory Visit: Payer: Self-pay | Admitting: Family Medicine

## 2021-08-14 ENCOUNTER — Encounter (HOSPITAL_COMMUNITY): Payer: Self-pay | Admitting: Hematology

## 2021-08-14 DIAGNOSIS — Z780 Asymptomatic menopausal state: Secondary | ICD-10-CM

## 2021-08-15 ENCOUNTER — Other Ambulatory Visit: Payer: 59

## 2021-08-15 DIAGNOSIS — N186 End stage renal disease: Secondary | ICD-10-CM | POA: Diagnosis not present

## 2021-08-15 DIAGNOSIS — D3500 Benign neoplasm of unspecified adrenal gland: Secondary | ICD-10-CM | POA: Diagnosis not present

## 2021-08-15 DIAGNOSIS — Z992 Dependence on renal dialysis: Secondary | ICD-10-CM | POA: Diagnosis not present

## 2021-08-15 DIAGNOSIS — N2581 Secondary hyperparathyroidism of renal origin: Secondary | ICD-10-CM | POA: Diagnosis not present

## 2021-08-18 DIAGNOSIS — N2581 Secondary hyperparathyroidism of renal origin: Secondary | ICD-10-CM | POA: Diagnosis not present

## 2021-08-18 DIAGNOSIS — N186 End stage renal disease: Secondary | ICD-10-CM | POA: Diagnosis not present

## 2021-08-18 DIAGNOSIS — Z992 Dependence on renal dialysis: Secondary | ICD-10-CM | POA: Diagnosis not present

## 2021-08-19 ENCOUNTER — Other Ambulatory Visit: Payer: Medicare Other

## 2021-08-20 ENCOUNTER — Other Ambulatory Visit: Payer: Medicare Other

## 2021-08-20 DIAGNOSIS — N186 End stage renal disease: Secondary | ICD-10-CM | POA: Diagnosis not present

## 2021-08-20 DIAGNOSIS — N2581 Secondary hyperparathyroidism of renal origin: Secondary | ICD-10-CM | POA: Diagnosis not present

## 2021-08-20 DIAGNOSIS — Z992 Dependence on renal dialysis: Secondary | ICD-10-CM | POA: Diagnosis not present

## 2021-08-22 DIAGNOSIS — N186 End stage renal disease: Secondary | ICD-10-CM | POA: Diagnosis not present

## 2021-08-22 DIAGNOSIS — Z992 Dependence on renal dialysis: Secondary | ICD-10-CM | POA: Diagnosis not present

## 2021-08-22 DIAGNOSIS — N2581 Secondary hyperparathyroidism of renal origin: Secondary | ICD-10-CM | POA: Diagnosis not present

## 2021-08-25 DIAGNOSIS — Z992 Dependence on renal dialysis: Secondary | ICD-10-CM | POA: Diagnosis not present

## 2021-08-25 DIAGNOSIS — N186 End stage renal disease: Secondary | ICD-10-CM | POA: Diagnosis not present

## 2021-08-25 DIAGNOSIS — N2581 Secondary hyperparathyroidism of renal origin: Secondary | ICD-10-CM | POA: Diagnosis not present

## 2021-08-25 LAB — SALIVARY CORTISOL X2, TIMED
Salivary Cortisol 2nd Specimen: 0.4 ug/dL
Salivary Cortisol Baseline: 0.963 ug/dL

## 2021-08-27 DIAGNOSIS — Z992 Dependence on renal dialysis: Secondary | ICD-10-CM | POA: Diagnosis not present

## 2021-08-27 DIAGNOSIS — N2581 Secondary hyperparathyroidism of renal origin: Secondary | ICD-10-CM | POA: Diagnosis not present

## 2021-08-27 DIAGNOSIS — N186 End stage renal disease: Secondary | ICD-10-CM | POA: Diagnosis not present

## 2021-08-29 DIAGNOSIS — N186 End stage renal disease: Secondary | ICD-10-CM | POA: Diagnosis not present

## 2021-08-29 DIAGNOSIS — Z992 Dependence on renal dialysis: Secondary | ICD-10-CM | POA: Diagnosis not present

## 2021-08-29 DIAGNOSIS — N2581 Secondary hyperparathyroidism of renal origin: Secondary | ICD-10-CM | POA: Diagnosis not present

## 2021-09-01 DIAGNOSIS — Z992 Dependence on renal dialysis: Secondary | ICD-10-CM | POA: Diagnosis not present

## 2021-09-01 DIAGNOSIS — N2581 Secondary hyperparathyroidism of renal origin: Secondary | ICD-10-CM | POA: Diagnosis not present

## 2021-09-01 DIAGNOSIS — N186 End stage renal disease: Secondary | ICD-10-CM | POA: Diagnosis not present

## 2021-09-02 ENCOUNTER — Other Ambulatory Visit: Payer: Self-pay | Admitting: Family Medicine

## 2021-09-02 ENCOUNTER — Encounter: Payer: Self-pay | Admitting: Family Medicine

## 2021-09-02 ENCOUNTER — Ambulatory Visit (INDEPENDENT_AMBULATORY_CARE_PROVIDER_SITE_OTHER): Payer: Medicare HMO | Admitting: Family Medicine

## 2021-09-02 VITALS — BP 135/68 | HR 87 | Resp 16 | Ht 64.0 in | Wt 194.0 lb

## 2021-09-02 DIAGNOSIS — I5032 Chronic diastolic (congestive) heart failure: Secondary | ICD-10-CM | POA: Diagnosis not present

## 2021-09-02 DIAGNOSIS — N289 Disorder of kidney and ureter, unspecified: Secondary | ICD-10-CM | POA: Diagnosis not present

## 2021-09-02 DIAGNOSIS — I10 Essential (primary) hypertension: Secondary | ICD-10-CM

## 2021-09-02 DIAGNOSIS — J209 Acute bronchitis, unspecified: Secondary | ICD-10-CM

## 2021-09-02 DIAGNOSIS — I259 Chronic ischemic heart disease, unspecified: Secondary | ICD-10-CM

## 2021-09-02 DIAGNOSIS — I152 Hypertension secondary to endocrine disorders: Secondary | ICD-10-CM

## 2021-09-02 MED ORDER — BENZONATATE 100 MG PO CAPS: 100.0000 mg | ORAL_CAPSULE | Freq: Two times a day (BID) | ORAL | 0 refills | Status: DC | PRN

## 2021-09-02 MED ORDER — AZITHROMYCIN 250 MG PO TABS: ORAL_TABLET | ORAL | 0 refills | Status: AC

## 2021-09-02 NOTE — Patient Instructions (Addendum)
F/u as before, call if you need me sooner  You are treated for acute bronchitis  You are referred to Cardiology for follow up  Thanks for choosing [facility], we consider it a privelige to serve you.

## 2021-09-02 NOTE — Progress Notes (Unsigned)
   Kathryn Beck     MRN: [id number]      DOB: 1955/07/11   HPI Kathryn Beck is here for follow up and re-evaluation of chronic medical conditions, medication management and review of any available recent lab and radiology data.  Preventive health is updated, specifically  Cancer screening and Immunization.   Questions or concerns regarding consultations or procedures which the PT has had in the interim are  addressed. The PT denies any adverse reactions to current medications since the last visit.  1 week h/o excess cough productive of yellow green sputum, also pressure over both cheeks , watery drainage  ROS Denies recent fever or chills. Denies sinus pressure, nasal congestion, ear pain or sore throat. Denies chest congestion, productive cough or wheezing. Denies chest pains, palpitations and leg swelling Denies abdominal pain, nausea, vomiting,diarrhea or constipation.   Denies dysuria, frequency, hesitancy or incontinence. Denies joint pain, swelling and limitation in mobility. Denies headaches, seizures, numbness, or tingling. Denies depression, anxiety or insomnia. Denies skin break down or rash.   PE  BP 135/68   Pulse 87   Resp 16   Ht '5\' 4"'$  (1.626 m)   Wt 194 lb (88 kg)   SpO2 94%   BMI 33.30 kg/m   Patient alert and oriented and in no cardiopulmonary distress.  HEENT: No facial asymmetry, EOMI,     Neck supple .  Chest: Clear to auscultation bilaterally.  CVS: S1, S2 no murmurs, no S3.Regular rate.  ABD: Soft non tender.   Ext: No edema  MS: Adequate ROM spine, shoulders, hips and knees.  Skin: Intact, no ulcerations or rash noted.  Psych: Good eye contact, normal affect. Memory intact not anxious or depressed appearing.  CNS: CN 2-12 intact, power,  normal throughout.no focal deficits noted.   Assessment & Plan  ***

## 2021-09-03 ENCOUNTER — Telehealth: Payer: Self-pay | Admitting: *Deleted

## 2021-09-03 DIAGNOSIS — N186 End stage renal disease: Secondary | ICD-10-CM | POA: Diagnosis not present

## 2021-09-03 DIAGNOSIS — Z992 Dependence on renal dialysis: Secondary | ICD-10-CM | POA: Diagnosis not present

## 2021-09-03 DIAGNOSIS — N2581 Secondary hyperparathyroidism of renal origin: Secondary | ICD-10-CM | POA: Diagnosis not present

## 2021-09-03 NOTE — Chronic Care Management (AMB) (Signed)
  Care Management   Outreach Note  09/03/2021 Name: Kathryn Beck MRN: [id number] DOB: May 28, 1955  Referred by: Fayrene Helper, MD Reason for referral : Care Coordination (Initial outreach to schedule referral with SW )   An unsuccessful telephone outreach was attempted today. The patient was referred to the case management team for assistance with care management and care coordination.   Follow Up Plan:  A HIPAA compliant phone message was left for the patient providing contact information and requesting a return call.  The care management team will reach out to the patient again over the next 7 days.  If patient returns call to provider office, please advise to call [facility]* at [phone].*  [facility]  Direct Dial: [phone]

## 2021-09-05 ENCOUNTER — Encounter (HOSPITAL_COMMUNITY): Payer: Self-pay | Admitting: Emergency Medicine

## 2021-09-05 ENCOUNTER — Encounter (HOSPITAL_COMMUNITY): Payer: Self-pay | Admitting: Hematology

## 2021-09-05 ENCOUNTER — Other Ambulatory Visit: Payer: Self-pay

## 2021-09-05 ENCOUNTER — Emergency Department (HOSPITAL_COMMUNITY): Payer: Medicare HMO

## 2021-09-05 ENCOUNTER — Emergency Department (HOSPITAL_COMMUNITY)
Admission: EM | Admit: 2021-09-05 | Discharge: 2021-09-05 | Disposition: A | Discharge status: Home / Self Care | Payer: Medicare HMO | Attending: Emergency Medicine | Admitting: Emergency Medicine

## 2021-09-05 DIAGNOSIS — R059 Cough, unspecified: Secondary | ICD-10-CM | POA: Insufficient documentation

## 2021-09-05 DIAGNOSIS — I120 Hypertensive chronic kidney disease with stage 5 chronic kidney disease or end stage renal disease: Secondary | ICD-10-CM | POA: Insufficient documentation

## 2021-09-05 DIAGNOSIS — N186 End stage renal disease: Secondary | ICD-10-CM | POA: Diagnosis not present

## 2021-09-05 DIAGNOSIS — Z79899 Other long term (current) drug therapy: Secondary | ICD-10-CM | POA: Insufficient documentation

## 2021-09-05 DIAGNOSIS — Z992 Dependence on renal dialysis: Secondary | ICD-10-CM | POA: Diagnosis not present

## 2021-09-05 DIAGNOSIS — Z794 Long term (current) use of insulin: Secondary | ICD-10-CM | POA: Diagnosis not present

## 2021-09-05 DIAGNOSIS — R0789 Other chest pain: Secondary | ICD-10-CM | POA: Diagnosis not present

## 2021-09-05 DIAGNOSIS — R112 Nausea with vomiting, unspecified: Secondary | ICD-10-CM | POA: Insufficient documentation

## 2021-09-05 DIAGNOSIS — Z7982 Long term (current) use of aspirin: Secondary | ICD-10-CM | POA: Insufficient documentation

## 2021-09-05 DIAGNOSIS — R0602 Shortness of breath: Secondary | ICD-10-CM | POA: Diagnosis not present

## 2021-09-05 DIAGNOSIS — R079 Chest pain, unspecified: Secondary | ICD-10-CM | POA: Diagnosis not present

## 2021-09-05 DIAGNOSIS — Z7984 Long term (current) use of oral hypoglycemic drugs: Secondary | ICD-10-CM | POA: Insufficient documentation

## 2021-09-05 DIAGNOSIS — E1165 Type 2 diabetes mellitus with hyperglycemia: Secondary | ICD-10-CM | POA: Diagnosis not present

## 2021-09-05 DIAGNOSIS — E1122 Type 2 diabetes mellitus with diabetic chronic kidney disease: Secondary | ICD-10-CM | POA: Insufficient documentation

## 2021-09-05 DIAGNOSIS — N2581 Secondary hyperparathyroidism of renal origin: Secondary | ICD-10-CM | POA: Diagnosis not present

## 2021-09-05 LAB — CBC WITH DIFFERENTIAL/PLATELET
Abs Immature Granulocytes: 0.16 10*3/uL — ABNORMAL HIGH (ref 0.00–0.07)
Basophils Absolute: 0 10*3/uL (ref 0.0–0.1)
Basophils Relative: 1 %
Eosinophils Absolute: 0.1 10*3/uL (ref 0.0–0.5)
Eosinophils Relative: 1 %
HCT: 36 % (ref 36.0–46.0)
Hemoglobin: 10.8 g/dL — ABNORMAL LOW (ref 12.0–15.0)
Immature Granulocytes: 2 %
Lymphocytes Relative: 23 %
Lymphs Abs: 1.6 10*3/uL (ref 0.7–4.0)
MCH: 24.5 pg — ABNORMAL LOW (ref 26.0–34.0)
MCHC: 30 g/dL (ref 30.0–36.0)
MCV: 81.8 fL (ref 80.0–100.0)
Monocytes Absolute: 0.7 10*3/uL (ref 0.1–1.0)
Monocytes Relative: 11 %
Neutro Abs: 4.1 10*3/uL (ref 1.7–7.7)
Neutrophils Relative %: 62 %
Platelets: 330 10*3/uL (ref 150–400)
RBC: 4.4 MIL/uL (ref 3.87–5.11)
RDW: 20.6 % — ABNORMAL HIGH (ref 11.5–15.5)
WBC: 6.6 10*3/uL (ref 4.0–10.5)
nRBC: 1.2 % — ABNORMAL HIGH (ref 0.0–0.2)

## 2021-09-05 LAB — TROPONIN I (HIGH SENSITIVITY)
Troponin I (High Sensitivity): 34 ng/L — ABNORMAL HIGH (ref <18)
Troponin I (High Sensitivity): 36 ng/L — ABNORMAL HIGH (ref <18)

## 2021-09-05 LAB — BASIC METABOLIC PANEL
Anion gap: 13 (ref 5–15)
BUN: 24 mg/dL — ABNORMAL HIGH (ref 8–23)
CO2: 29 mmol/L (ref 22–32)
Calcium: 9 mg/dL (ref 8.9–10.3)
Chloride: 99 mmol/L (ref 98–111)
Creatinine, Ser: 4.38 mg/dL — ABNORMAL HIGH (ref 0.44–1.00)
GFR, Estimated: 11 mL/min — ABNORMAL LOW (ref >60)
Glucose, Bld: 204 mg/dL — ABNORMAL HIGH (ref 70–99)
Potassium: 3.2 mmol/L — ABNORMAL LOW (ref 3.5–5.1)
Sodium: 141 mmol/L (ref 135–145)

## 2021-09-05 LAB — CBG MONITORING, ED: Glucose-Capillary: 196 mg/dL — ABNORMAL HIGH (ref 70–99)

## 2021-09-05 MED ORDER — ASPIRIN 81 MG PO CHEW
324.0000 mg | CHEWABLE_TABLET | Freq: Once | ORAL | Status: AC
  Administered 2021-09-05: 324 mg via ORAL
  Filled 2021-09-05: qty 4

## 2021-09-05 MED ORDER — ACETAMINOPHEN 500 MG PO TABS
1000.0000 mg | ORAL_TABLET | Freq: Once | ORAL | Status: AC
Start: 2021-09-05 — End: 2021-09-05
  Administered 2021-09-05: 1000 mg via ORAL
  Filled 2021-09-05: qty 2

## 2021-09-05 MED ORDER — NITROGLYCERIN 0.4 MG SL SUBL: 0.4000 mg | SUBLINGUAL_TABLET | SUBLINGUAL | Status: DC | PRN

## 2021-09-05 MED ORDER — ONDANSETRON HCL 4 MG/2ML IJ SOLN
4.0000 mg | Freq: Once | INTRAMUSCULAR | Status: AC
Start: 2021-09-05 — End: 2021-09-05
  Administered 2021-09-05: 4 mg via INTRAVENOUS
  Filled 2021-09-05: qty 2

## 2021-09-05 NOTE — ED Provider Notes (Signed)
[facility] Provider Note   CSN: [id number] Arrival date & time: 09/05/21  5974     History {Add pertinent medical, surgical, social history, OB history to HPI:1} Chief Complaint  Patient presents with   Chest Pain    Kathryn Beck is a 66 y.o. female with a history including end-stage renal disease on dialysis, last treatment just prior to arrival, hypertension, type 2 diabetes, who was recently being treated for acute bronchitis on Zithromax and Tessalon presenting with right sided chest pressure which started this morning just prior to arrival.  She was about 15 minutes away from completing her dialysis treatment when her symptoms began.  It was accompanied by shortness of breath, nausea and emesis x1.  She does continue to have some mild nausea and right-sided chest pressure, the shortness of breath is currently better.  She has had a nonproductive cough, afebrile, denies palpitations, denies abdominal pain.  Her discomfort radiates into her back.  She has had no treatment for her symptoms prior to arrival and states she has never had similar symptoms in the past.  The history is provided by the patient.       Home Medications Prior to Admission medications   Medication Sig Start Date End Date Taking? Authorizing Provider  amLODipine (NORVASC) 5 MG tablet Take 1 tablet by mouth twice daily.**DO NOT TAKE AM DOSE ON DIALYSIS DAYS** 07/29/21   Fayrene Helper, MD  aspirin 81 MG chewable tablet CHEW 2 TABLETS BY MOUTH ONCE DAILY 07/25/21   Fayrene Helper, MD  azithromycin (ZITHROMAX) 250 MG tablet Take 2 tablets on day 1, then 1 tablet daily on days 2 through 5 09/02/21 09/07/21  Fayrene Helper, MD  B Complex-C-Folic Acid (RENA-VITE RX) 1 MG TABS Take 1 tablet by mouth daily. 11/13/20   [provider]  benzonatate (TESSALON) 100 MG capsule Take 1 capsule (100 mg total) by mouth 2 (two) times daily as needed for cough. 09/02/21   Fayrene Helper,  MD  calcitRIOL (ROCALTROL) 0.25 MCG capsule Take 0.25 mcg by mouth daily. 07/13/19   [provider]  cloNIDine (CATAPRES) 0.2 MG tablet Take 0.2 mg by mouth 2 (two) times daily. 11/13/20   [provider]  cyclobenzaprine (FLEXERIL) 5 MG tablet TAKE ONE TABLET BY MOUTH AT BEDTIME AS NEEDED FOR MUSCLE SPASMS. Patient taking differently: Take 5 mg by mouth at bedtime as needed for muscle spasms. 06/10/21   Paseda, Dewaine Conger, FNP  DULoxetine (CYMBALTA) 60 MG capsule TAKE (1) CAPSULE BY MOUTH ONCE DAILY. Patient taking differently: Take 60 mg by mouth daily. 09/17/20   Fayrene Helper, MD  ezetimibe (ZETIA) 10 MG tablet TAKE 1 TABLET BY MOUTH ONCE DAILY. Patient taking differently: Take 10 mg by mouth daily. 11/14/20   Fayrene Helper, MD  fluticasone (FLONASE) 50 MCG/ACT nasal spray Place 2 sprays into both nostrils daily. 06/10/21   Renee Rival, FNP  HYDROcodone-acetaminophen (NORCO/VICODIN) 5-325 MG tablet Take 1 tablet by mouth every 6 (six) hours as needed for moderate pain. 08/11/21   Carole Civil, MD  insulin aspart (FIASP FLEXTOUCH) 100 UNIT/ML FlexTouch Pen Inject 18-30 Units into the skin with breakfast, with lunch, and with evening meal. 07/22/21   [name], MD  insulin glargine, 2 Unit Dial, (TOUJEO MAX SOLOSTAR) 300 UNIT/ML Solostar Pen Inject 60 Units into the skin daily. 07/22/21   [name], MD  Insulin Pen Needle 32G X 4 MM MISC Use 4x a day 05/02/21  [name], MD  irbesartan (AVAPRO) 150 MG tablet Take 150 mg by mouth every evening. 10/23/20   [provider]  isosorbide mononitrate (IMDUR) 30 MG 24 hr tablet Take 1 tablet (30 mg total) by mouth daily. 07/29/21   Fayrene Helper, MD  Melatonin 3 MG TBDP Take one tablet by mouth at bedtime , for sleep Patient taking differently: Take 3 mg by mouth at bedtime as needed (Sleep). 10/23/20   Fayrene Helper, MD  metoprolol succinate (TOPROL-XL) 50 MG 24 hr tablet TAKE  (1) TABLET BY MOUTH DAILY WITH FOOD *TAKE AFTER DIALYSIS* 07/29/21   Fayrene Helper, MD  midodrine (PROAMATINE) 10 MG tablet TAKE 1 TABLET BY MOUTH TWICE DAILY Patient taking differently: Take 10 mg by mouth 2 (two) times daily. 10/26/19   Fayrene Helper, MD  ofloxacin (FLOXIN) 0.3 % OTIC solution SMARTSIG:10 Drop(s) In Ear(s) Daily Patient not taking: Reported on 09/02/2021 07/20/21   [provider]  ondansetron (ZOFRAN) 4 MG tablet Take 1 tablet (4 mg total) by mouth every 8 (eight) hours as needed for nausea or vomiting. 09/20/19   Fayrene Helper, MD  pregabalin (LYRICA) 50 MG capsule TAKE (1) CAPSULE BY MOUTH THREE TIMES DAILY Patient taking differently: Take 50 mg by mouth 3 (three) times daily. 06/26/21   Carole Civil, MD  sertraline (ZOLOFT) 100 MG tablet Take 1 tablet (100 mg total) by mouth daily. 07/29/21   Fayrene Helper, MD  sevelamer carbonate (RENVELA) 800 MG tablet Take by mouth. 06/02/21   [provider]  torsemide (DEMADEX) 100 MG tablet TAKE 1 TABLET BY MOUTH TWICE DAILY *HOLD MORNING DOSE ON DIALYSIS DAYS* 07/25/21   Fayrene Helper, MD  triamcinolone cream (KENALOG) 0.1 % APPLY TO AFFECTED AREA TWICE A DAY 07/21/21   Lindell Spar, MD  [name] 500 MG chewable tablet Chew 500 mg by mouth See admin instructions. Take 500 mg with each snack and meal 11/07/18   [provider]  zolpidem (AMBIEN) 5 MG tablet TAKE 1 TABLET BY MOUTH AT BEDTIME AS NEEDED. Patient taking differently: Take 5 mg by mouth at bedtime as needed for sleep. 08/27/20   Fayrene Helper, MD  FLUoxetine (PROZAC) 10 MG capsule Take 10 mg by mouth daily.    05/28/11  [provider]  glipiZIDE (GLUCOTROL) 10 MG tablet Take 10 mg by mouth 2 (two) times daily before a meal.    05/28/11  [provider]      Allergies    Ace inhibitors, Penicillins, Statins, and Albuterol    Review of Systems   Review of Systems  Constitutional:  Negative for fever.   HENT:  Negative for congestion and sore throat.   Eyes: Negative.   Respiratory:  Positive for shortness of breath. Negative for chest tightness.   Cardiovascular:  Positive for chest pain.  Gastrointestinal:  Positive for nausea and vomiting. Negative for abdominal pain.  Genitourinary: Negative.   Musculoskeletal:  Negative for arthralgias, joint swelling and neck pain.  Skin: Negative.  Negative for rash and wound.  Neurological:  Negative for dizziness, weakness, light-headedness, numbness and headaches.  Psychiatric/Behavioral: Negative.      Physical Exam Updated Vital Signs There were no vitals taken for this visit. Physical Exam Vitals and nursing note reviewed.  Constitutional:      Appearance: She is well-developed.  HENT:     Head: Normocephalic and atraumatic.  Eyes:     Conjunctiva/sclera: Conjunctivae normal.  Cardiovascular:  Rate and Rhythm: Normal rate and regular rhythm.     Heart sounds: Normal heart sounds.  Pulmonary:     Effort: Pulmonary effort is normal.     Breath sounds: Normal breath sounds. No wheezing or rhonchi.  Abdominal:     General: Bowel sounds are normal.     Palpations: Abdomen is soft.     Tenderness: There is no abdominal tenderness.  Musculoskeletal:        General: Normal range of motion.     Cervical back: Normal range of motion.     Right lower leg: No edema.     Left lower leg: No edema.  Skin:    General: Skin is warm and dry.  Neurological:     Mental Status: She is alert.    ED Results / Procedures / Treatments   Labs (all labs ordered are listed, but only abnormal results are displayed) Labs Reviewed - No data to display  EKG None  Radiology No results found.  Procedures Procedures  {Document cardiac monitor, telemetry assessment procedure when appropriate:1}  Medications Ordered in ED Medications - No data to display  ED Course/ Medical Decision Making/ A&P Clinical Course as of 09/05/21 1652  Fri  Sep 05, 2021  1411 Pt has been sleeping, comfortably during the course of her visit.  C/o headache and low back pain, no further right sided chest discomfort.  She has not required any ntg here [JI]    Clinical Course User Index [JI] Evalee Jefferson, PA-C                           Medical Decision Making Pt with   Amount and/or Complexity of Data Reviewed Labs: ordered. Radiology: ordered.  Risk OTC drugs. Prescription drug management.     {Document critical care time when appropriate:1} {Document review of labs and clinical decision tools ie heart score, Chads2Vasc2 etc:1}  {Document your independent review of radiology images, and any outside records:1} {Document your discussion with family members, caretakers, and with consultants:1} {Document social determinants of health affecting pt's care:1} {Document your decision making why or why not admission, treatments were needed:1} Final Clinical Impression(s) / ED Diagnoses Final diagnoses:  None    Rx / DC Orders ED Discharge Orders     None

## 2021-09-05 NOTE — ED Triage Notes (Signed)
Pt BIB [facility] from dialysis with reports of chest pain. Pt received 2 nitro en route with no change in pain.

## 2021-09-08 DIAGNOSIS — N186 End stage renal disease: Secondary | ICD-10-CM | POA: Diagnosis not present

## 2021-09-08 DIAGNOSIS — N2581 Secondary hyperparathyroidism of renal origin: Secondary | ICD-10-CM | POA: Diagnosis not present

## 2021-09-08 DIAGNOSIS — Z992 Dependence on renal dialysis: Secondary | ICD-10-CM | POA: Diagnosis not present

## 2021-09-09 NOTE — Chronic Care Management (AMB) (Unsigned)
  Care Management   Outreach Note  09/09/2021 Name: Kathryn Beck MRN: [id number] DOB: 11-26-1955  Referred by: Fayrene Helper, MD Reason for referral : Care Coordination (Initial outreach to schedule referral with SW )   A second unsuccessful telephone outreach was attempted today. The patient was referred to the case management team for assistance with care management and care coordination.   Follow Up Plan:  A HIPAA compliant phone message was left for the patient providing contact information and requesting a return call.  The care management team will reach out to the patient again over the next 7 days.  If patient returns call to provider office, please advise to call [facility]* at [phone].*  [facility]  Direct Dial: [phone]

## 2021-09-10 DIAGNOSIS — Z992 Dependence on renal dialysis: Secondary | ICD-10-CM | POA: Diagnosis not present

## 2021-09-10 DIAGNOSIS — N186 End stage renal disease: Secondary | ICD-10-CM | POA: Diagnosis not present

## 2021-09-10 DIAGNOSIS — N2581 Secondary hyperparathyroidism of renal origin: Secondary | ICD-10-CM | POA: Diagnosis not present

## 2021-09-10 NOTE — Chronic Care Management (AMB) (Signed)
  Care Management   Note  09/10/2021 Name: Kathryn Beck MRN: [id number] DOB: February 07, 1956  Kathryn Beck is a 66 y.o. year old female who is a primary care patient of Moshe Cipro Norwood Levo, MD. I reached out to Waldon Reining by phone today offer care coordination services.   Kathryn Beck was given information about care management services today including:  Care management services include personalized support from designated clinical staff supervised by her physician, including individualized plan of care and coordination with other care providers 24/7 contact phone numbers for assistance for urgent and routine care needs. The patient may stop care management services at any time by phone call to the office staff.  Patient agreed to services and verbal consent obtained.   Follow up plan: Telephone appointment with care management team member scheduled for:09/11/2021  Kathryn Beck, [address], [place], [place] [address] Direct Dial: [phone] [email or web address]: [email or web address]

## 2021-09-11 ENCOUNTER — Telehealth: Payer: Medicare HMO

## 2021-09-11 ENCOUNTER — Ambulatory Visit: Payer: Medicare HMO | Admitting: Licensed Clinical Social Worker

## 2021-09-11 DIAGNOSIS — E782 Mixed hyperlipidemia: Secondary | ICD-10-CM

## 2021-09-11 DIAGNOSIS — I5032 Chronic diastolic (congestive) heart failure: Secondary | ICD-10-CM

## 2021-09-11 DIAGNOSIS — J069 Acute upper respiratory infection, unspecified: Secondary | ICD-10-CM

## 2021-09-11 DIAGNOSIS — F322 Major depressive disorder, single episode, severe without psychotic features: Secondary | ICD-10-CM

## 2021-09-11 DIAGNOSIS — N186 End stage renal disease: Secondary | ICD-10-CM

## 2021-09-11 DIAGNOSIS — R413 Other amnesia: Secondary | ICD-10-CM

## 2021-09-11 DIAGNOSIS — Z992 Dependence on renal dialysis: Secondary | ICD-10-CM

## 2021-09-11 DIAGNOSIS — I152 Hypertension secondary to endocrine disorders: Secondary | ICD-10-CM

## 2021-09-11 DIAGNOSIS — E1159 Type 2 diabetes mellitus with other circulatory complications: Secondary | ICD-10-CM

## 2021-09-11 DIAGNOSIS — Z780 Asymptomatic menopausal state: Secondary | ICD-10-CM

## 2021-09-11 DIAGNOSIS — R2681 Unsteadiness on feet: Secondary | ICD-10-CM

## 2021-09-11 NOTE — Patient Instructions (Signed)
Visit Information  Thank you for taking time to visit with me today. Please don't hesitate to contact me if I can be of assistance to you before our next scheduled telephone appointment.  Following are the goals we discussed today:   Our next appointment is by telephone on 10/16/21 at 1:00 PM   Please call the care guide team at [phone] if you need to cancel or reschedule your appointment.   If you are experiencing a Mental Health or [place] or need someone to talk to, please call the [facility]: [phone]   Following is a copy of your full plan of care:  Care Plan : [place]  Updates made by Katha Cabal, LCSW since 09/11/2021 12:00 AM     Problem: Emotional Distress      Goal: Emotional Distress identified. Manage Depression. Manage anxiety issues   Start Date: 09/11/2021  Expected End Date: 12/11/2021  This Visit's Progress: Not on track  Priority: Medium  Note:   Current Barriers:   Mobility issues Financial challenges Sleeping issues Decreased appetite sometimes Suicidal Ideation/Homicidal Ideation: No  Clinical Social Work Goal(s):  patient will work with SW  by telephone or in person to reduce or manage symptoms related to depression and anxiety Kathryn Beck will attend scheduled medical appointments in next 30 days Patient will communicate with RNCM as needed in next 30 days for nursing support  Interventions: Patient interviewed and appropriate assessments performed: PHQ 2/9; GAD-7 1:1 collaboration with Fayrene Helper, MD regarding development and update of comprehensive plan of care as evidenced by provider attestation and co-signature Discussed client needs with Waldon Reining Reviewed mobility needs of client. Reviewed sleeping issues of client. Reviewed pain issues of client Reviewed family support. Client has help from her spouse and has help from her daughter Discussed transport needs. She said her  family helps transport her to and from dialysis appointments each week Discussed medication procurement of client Reviewed walking challenges of client. She said she gets dizzy occasionally. She uses a cane to help her walk Informed client of support of RNCM as needed as part of Care Management program Discussed client insurance. She said she recently switched to [facility] coverage Discussed Care Management program and support of LCSW and RNCM with client  Patient  Coping Strengths:  Attends scheduled medical appointments Attends Dialysis treatments weekly as scheduled. Takes medications as prescribed  Patient Self Care Deficits:  Mobility issues Depression issues Sleeping challenges  Patient Goals:  - spend time or talk with others at least 2 to 3 times per week - practice relaxation or meditation daily - keep a calendar with appointment dates  Follow Up Plan: LCSW to call client on 10/16/21 at 1:00 PM      Kathryn Beck was given information about Care Management services by the embedded care coordination team including:  Care Management services include personalized support from designated clinical staff supervised by her physician, including individualized plan of care and coordination with other care providers 24/7 contact phone numbers for assistance for urgent and routine care needs. The patient may stop CCM services at any time (effective at the end of the month) by phone call to the office staff.  Patient agreed to services and verbal consent obtained.   Norva Riffle.Kathryn Beck MSW, [place] [facility] representative [facility] Care Management [phone]

## 2021-09-12 DIAGNOSIS — N2581 Secondary hyperparathyroidism of renal origin: Secondary | ICD-10-CM | POA: Diagnosis not present

## 2021-09-12 DIAGNOSIS — Z992 Dependence on renal dialysis: Secondary | ICD-10-CM | POA: Diagnosis not present

## 2021-09-12 DIAGNOSIS — N186 End stage renal disease: Secondary | ICD-10-CM | POA: Diagnosis not present

## 2021-09-13 ENCOUNTER — Other Ambulatory Visit: Payer: 59

## 2021-09-14 ENCOUNTER — Encounter: Payer: Self-pay | Admitting: Family Medicine

## 2021-09-14 NOTE — Assessment & Plan Note (Signed)
Was on transplant list howvere, needs blood sugar controled prior to moving forward in the process

## 2021-09-15 ENCOUNTER — Telehealth: Payer: Self-pay

## 2021-09-15 DIAGNOSIS — N2581 Secondary hyperparathyroidism of renal origin: Secondary | ICD-10-CM | POA: Diagnosis not present

## 2021-09-15 DIAGNOSIS — Z992 Dependence on renal dialysis: Secondary | ICD-10-CM | POA: Diagnosis not present

## 2021-09-15 DIAGNOSIS — N186 End stage renal disease: Secondary | ICD-10-CM | POA: Diagnosis not present

## 2021-09-15 NOTE — Telephone Encounter (Signed)
Pt daughter called to request PA for MRI imaging ordered. Daughter contacted insurance and they needed more information to approve imaging.

## 2021-09-17 ENCOUNTER — Other Ambulatory Visit: Payer: Self-pay | Admitting: Family Medicine

## 2021-09-17 ENCOUNTER — Other Ambulatory Visit: Payer: Self-pay

## 2021-09-17 ENCOUNTER — Encounter: Payer: Self-pay | Admitting: Family Medicine

## 2021-09-17 DIAGNOSIS — N186 End stage renal disease: Secondary | ICD-10-CM | POA: Diagnosis not present

## 2021-09-17 DIAGNOSIS — R413 Other amnesia: Secondary | ICD-10-CM

## 2021-09-17 DIAGNOSIS — N2581 Secondary hyperparathyroidism of renal origin: Secondary | ICD-10-CM | POA: Diagnosis not present

## 2021-09-17 DIAGNOSIS — Z992 Dependence on renal dialysis: Secondary | ICD-10-CM | POA: Diagnosis not present

## 2021-09-17 NOTE — Telephone Encounter (Signed)
Scheduled patient to come in tomorrow

## 2021-09-18 ENCOUNTER — Ambulatory Visit (INDEPENDENT_AMBULATORY_CARE_PROVIDER_SITE_OTHER): Payer: Medicare HMO | Admitting: Family Medicine

## 2021-09-18 ENCOUNTER — Encounter: Payer: Self-pay | Admitting: Family Medicine

## 2021-09-18 VITALS — BP 138/68 | HR 86 | Ht 64.0 in | Wt 191.1 lb

## 2021-09-18 DIAGNOSIS — E1122 Type 2 diabetes mellitus with diabetic chronic kidney disease: Secondary | ICD-10-CM | POA: Diagnosis not present

## 2021-09-18 DIAGNOSIS — Z794 Long term (current) use of insulin: Secondary | ICD-10-CM

## 2021-09-18 DIAGNOSIS — N186 End stage renal disease: Secondary | ICD-10-CM

## 2021-09-18 DIAGNOSIS — E1159 Type 2 diabetes mellitus with other circulatory complications: Secondary | ICD-10-CM | POA: Diagnosis not present

## 2021-09-18 DIAGNOSIS — H6122 Impacted cerumen, left ear: Secondary | ICD-10-CM | POA: Diagnosis not present

## 2021-09-18 DIAGNOSIS — H6691 Otitis media, unspecified, right ear: Secondary | ICD-10-CM

## 2021-09-18 DIAGNOSIS — I152 Hypertension secondary to endocrine disorders: Secondary | ICD-10-CM

## 2021-09-18 DIAGNOSIS — R413 Other amnesia: Secondary | ICD-10-CM

## 2021-09-18 DIAGNOSIS — H9203 Otalgia, bilateral: Secondary | ICD-10-CM | POA: Diagnosis not present

## 2021-09-18 DIAGNOSIS — Z992 Dependence on renal dialysis: Secondary | ICD-10-CM

## 2021-09-18 MED ORDER — SULFAMETHOXAZOLE-TRIMETHOPRIM 800-160 MG PO TABS: 1.0000 | ORAL_TABLET | Freq: Two times a day (BID) | ORAL | 0 refills | Status: DC

## 2021-09-18 NOTE — Assessment & Plan Note (Signed)
4 day h/o severe righ greater than left ear pain. Follows withENT, needs soonser appt

## 2021-09-18 NOTE — Assessment & Plan Note (Signed)
Ear pain and reduced hearing

## 2021-09-18 NOTE — Progress Notes (Signed)
Kathryn Beck     MRN: [id number]      DOB: [birth year]   HPI Kathryn Beck is here with c/o bilateral ear pain , right worse than left, no fever , chills or sore throat or cough C/o numbness on left side , and concerned re memory,Blood sugar remains up and down, working on getting this right so she can get back on transplant list ROS Denies recent fever or chills.  Denies chest congestion, productive cough or wheezing. Denies chest pains, palpitations and leg swelling Denies abdominal pain, nausea, vomiting,diarrhea or constipation.   Denies dysuria, frequency, hesitancy or incontinence. Denies uncontrolled joint pain, swelling and limitation in mobility. Denies headaches, seizures, numbness, or tingling. Denies depression, anxiety or insomnia. Denies skin break down or rash.   PE  BP 138/68   Pulse 86   Ht '5\' 4"'$  (1.626 m)   Wt 191 lb 1.3 oz (86.7 kg)   SpO2 92%   BMI 32.80 kg/m   Patient alert and oriented and in no cardiopulmonary distress.  HEENT: No facial asymmetry, EOMI,     Neck supple .Right TM erythematous and dull, right cervical adenitis  Chest: Clear to auscultation bilaterally.  CVS: S1, S2 no murmurs, no S3.Regular rate.  ABD: Soft non tender.   Ext: No edema  MS: Adequate ROM spine, shoulders, hips and knees.  Skin: Intact, no ulcerations or rash noted.  Psych: Good eye contact, normal affect. Memory intact not anxious or depressed appearing.  CNS: CN 2-12 intact, power,  normal throughout.no focal deficits noted.   Assessment & Plan  Ear pain, bilateral 4 day h/o severe righ greater than left ear pain. Follows withENT, needs soonser appt  Left ear impacted cerumen Ear pain and reduced hearing  Memory loss Evaluation by Neurology  Acute right otitis media Antibiotic course prescribed and sooner appt with ENT requested due to chronic recurrent complaint  Hypertension associated with diabetes ([place]) DASH diet and commitment to daily  physical activity for a minimum of 30 minutes discussed and encouraged, as a part of hypertension management. The importance of attaining a healthy weight is also discussed.     09/18/2021    9:37 AM 09/05/2021    2:30 PM 09/05/2021    2:00 PM 09/05/2021   12:30 PM 09/05/2021   12:00 PM 09/05/2021   11:00 AM 09/05/2021   10:50 AM  BP/Weight  Systolic BP 998 338 250 539 767 341 937  Diastolic BP 68 77 83 67 68 69 64  Wt. (Lbs) 191.08        BMI 32.8 kg/m2           Controlled, no change in medication   Diabetes mellitus ([place]) Kathryn Beck is reminded of the importance of commitment to daily physical activity for 30 minutes or more, as able and the need to limit carbohydrate intake to 30 to 60 grams per meal to help with blood sugar control.   The need to take medication as prescribed, test blood sugar as directed, and to call between visits if there is a concern that blood sugar is uncontrolled is also discussed.   Kathryn Beck is reminded of the importance of daily foot exam, annual eye examination, and good blood sugar, blood pressure and cholesterol control.     Latest Ref Rng & Units 09/05/2021   10:08 AM 07/22/2021    8:17 AM 07/06/2021    9:35 AM 06/10/2021   10:42 AM 05/02/2021    1:17 PM  Diabetic  Labs  HbA1c 4.0 - 5.6 %  9.1    11.4   Creatinine 0.44 - 1.00 mg/dL 4.38   5.06  6.52        09/18/2021    9:37 AM 09/05/2021    2:30 PM 09/05/2021    2:00 PM 09/05/2021   12:30 PM 09/05/2021   12:00 PM 09/05/2021   11:00 AM 09/05/2021   10:50 AM  BP/Weight  Systolic BP 718 550 158 [phone] 521  Diastolic BP 68 77 83 67 68 69 64  Wt. (Lbs) 191.08        BMI 32.8 kg/m2            Latest Ref Rng & Units 07/24/2021    3:42 PM 03/06/2021    2:00 PM  Foot/eye exam completion dates  Eye Exam No Retinopathy No Retinopathy       Foot Form Completion   Done     This result is from an external source.   Uncontrolled,followed by Endo

## 2021-09-18 NOTE — Patient Instructions (Signed)
F/U as before, call if you need me sooner   Please schedule bone density on a Tuesday or Thursday at checkout  Your screen for dementia  today is NORMAL. Please do keep appt with Dr Merlene Laughter when his office gives you the app info regarding the concerns expressed by your daughter, and encourage her to accompany you to the visit if possible  You are treated for acute right ear infection, antibiotics are at the pharmacy, I have also requested a sooner visit with Dr Benjamine Mola, due to ongoing/ recurrent ear pain  Thanks for choosing [facility], we consider it a privelige to serve you.

## 2021-09-19 DIAGNOSIS — Z992 Dependence on renal dialysis: Secondary | ICD-10-CM | POA: Diagnosis not present

## 2021-09-19 DIAGNOSIS — N2581 Secondary hyperparathyroidism of renal origin: Secondary | ICD-10-CM | POA: Diagnosis not present

## 2021-09-19 DIAGNOSIS — N186 End stage renal disease: Secondary | ICD-10-CM | POA: Diagnosis not present

## 2021-09-22 DIAGNOSIS — N2581 Secondary hyperparathyroidism of renal origin: Secondary | ICD-10-CM | POA: Diagnosis not present

## 2021-09-22 DIAGNOSIS — Z992 Dependence on renal dialysis: Secondary | ICD-10-CM | POA: Diagnosis not present

## 2021-09-22 DIAGNOSIS — N186 End stage renal disease: Secondary | ICD-10-CM | POA: Diagnosis not present

## 2021-09-23 NOTE — Assessment & Plan Note (Signed)
Ms. Whittlesey is reminded of the importance of commitment to daily physical activity for 30 minutes or more, as able and the need to limit carbohydrate intake to 30 to 60 grams per meal to help with blood sugar control.   The need to take medication as prescribed, test blood sugar as directed, and to call between visits if there is a concern that blood sugar is uncontrolled is also discussed.   Ms. Babler is reminded of the importance of daily foot exam, annual eye examination, and good blood sugar, blood pressure and cholesterol control.     Latest Ref Rng & Units 09/05/2021   10:08 AM 07/22/2021    8:17 AM 07/06/2021    9:35 AM 06/10/2021   10:42 AM 05/02/2021    1:17 PM  Diabetic Labs  HbA1c 4.0 - 5.6 %  9.1    11.4   Creatinine 0.44 - 1.00 mg/dL 4.38   5.06  6.52        09/18/2021    9:37 AM 09/05/2021    2:30 PM 09/05/2021    2:00 PM 09/05/2021   12:30 PM 09/05/2021   12:00 PM 09/05/2021   11:00 AM 09/05/2021   10:50 AM  BP/Weight  Systolic BP 542 706 237 628 315 176 160  Diastolic BP 68 77 83 67 68 69 64  Wt. (Lbs) 191.08        BMI 32.8 kg/m2            Latest Ref Rng & Units 07/24/2021    3:42 PM 03/06/2021    2:00 PM  Foot/eye exam completion dates  Eye Exam No Retinopathy No Retinopathy       Foot Form Completion   Done     This result is from an external source.   Uncontrolled,followed by Endo

## 2021-09-23 NOTE — Assessment & Plan Note (Signed)
Evaluation by Neurology

## 2021-09-23 NOTE — Assessment & Plan Note (Signed)
Antibiotic course prescribed and sooner appt with ENT requested due to chronic recurrent complaint

## 2021-09-23 NOTE — Assessment & Plan Note (Signed)
DASH diet and commitment to daily physical activity for a minimum of 30 minutes discussed and encouraged, as a part of hypertension management. The importance of attaining a healthy weight is also discussed.     09/18/2021    9:37 AM 09/05/2021    2:30 PM 09/05/2021    2:00 PM 09/05/2021   12:30 PM 09/05/2021   12:00 PM 09/05/2021   11:00 AM 09/05/2021   10:50 AM  BP/Weight  Systolic BP 446 286 381 771 165 790 383  Diastolic BP 68 77 83 67 68 69 64  Wt. (Lbs) 191.08        BMI 32.8 kg/m2           Controlled, no change in medication

## 2021-09-24 DIAGNOSIS — N186 End stage renal disease: Secondary | ICD-10-CM | POA: Diagnosis not present

## 2021-09-24 DIAGNOSIS — N2581 Secondary hyperparathyroidism of renal origin: Secondary | ICD-10-CM | POA: Diagnosis not present

## 2021-09-24 DIAGNOSIS — Z992 Dependence on renal dialysis: Secondary | ICD-10-CM | POA: Diagnosis not present

## 2021-09-25 ENCOUNTER — Other Ambulatory Visit (HOSPITAL_COMMUNITY): Payer: Medicare HMO

## 2021-09-25 ENCOUNTER — Other Ambulatory Visit (HOSPITAL_COMMUNITY): Payer: Self-pay

## 2021-09-25 ENCOUNTER — Ambulatory Visit (HOSPITAL_COMMUNITY)
Admission: RE | Admit: 2021-09-25 | Discharge: 2021-09-25 | Disposition: A | Discharge status: Home / Self Care | Payer: Medicare HMO | Source: Ambulatory Visit | Attending: Family Medicine | Admitting: Family Medicine

## 2021-09-25 ENCOUNTER — Other Ambulatory Visit: Payer: Self-pay | Admitting: Family Medicine

## 2021-09-25 DIAGNOSIS — D638 Anemia in other chronic diseases classified elsewhere: Secondary | ICD-10-CM

## 2021-09-25 DIAGNOSIS — M810 Age-related osteoporosis without current pathological fracture: Secondary | ICD-10-CM

## 2021-09-25 DIAGNOSIS — M8588 Other specified disorders of bone density and structure, other site: Secondary | ICD-10-CM | POA: Diagnosis not present

## 2021-09-25 DIAGNOSIS — D472 Monoclonal gammopathy: Secondary | ICD-10-CM

## 2021-09-25 DIAGNOSIS — Z780 Asymptomatic menopausal state: Secondary | ICD-10-CM | POA: Insufficient documentation

## 2021-09-25 NOTE — Progress Notes (Signed)
No fosamax prescribed

## 2021-09-26 ENCOUNTER — Inpatient Hospital Stay (HOSPITAL_COMMUNITY): Payer: Medicare HMO | Attending: Physician Assistant

## 2021-09-26 DIAGNOSIS — K219 Gastro-esophageal reflux disease without esophagitis: Secondary | ICD-10-CM | POA: Insufficient documentation

## 2021-09-26 DIAGNOSIS — Z7982 Long term (current) use of aspirin: Secondary | ICD-10-CM | POA: Insufficient documentation

## 2021-09-26 DIAGNOSIS — Z992 Dependence on renal dialysis: Secondary | ICD-10-CM | POA: Diagnosis not present

## 2021-09-26 DIAGNOSIS — N186 End stage renal disease: Secondary | ICD-10-CM | POA: Insufficient documentation

## 2021-09-26 DIAGNOSIS — G4730 Sleep apnea, unspecified: Secondary | ICD-10-CM | POA: Insufficient documentation

## 2021-09-26 DIAGNOSIS — E1122 Type 2 diabetes mellitus with diabetic chronic kidney disease: Secondary | ICD-10-CM | POA: Insufficient documentation

## 2021-09-26 DIAGNOSIS — N2581 Secondary hyperparathyroidism of renal origin: Secondary | ICD-10-CM | POA: Diagnosis not present

## 2021-09-26 DIAGNOSIS — E785 Hyperlipidemia, unspecified: Secondary | ICD-10-CM | POA: Insufficient documentation

## 2021-09-26 DIAGNOSIS — Z79899 Other long term (current) drug therapy: Secondary | ICD-10-CM | POA: Insufficient documentation

## 2021-09-26 DIAGNOSIS — Z794 Long term (current) use of insulin: Secondary | ICD-10-CM | POA: Insufficient documentation

## 2021-09-26 DIAGNOSIS — D649 Anemia, unspecified: Secondary | ICD-10-CM | POA: Insufficient documentation

## 2021-09-26 DIAGNOSIS — E1140 Type 2 diabetes mellitus with diabetic neuropathy, unspecified: Secondary | ICD-10-CM | POA: Insufficient documentation

## 2021-09-26 DIAGNOSIS — Z8616 Personal history of COVID-19: Secondary | ICD-10-CM | POA: Insufficient documentation

## 2021-09-26 DIAGNOSIS — Z7984 Long term (current) use of oral hypoglycemic drugs: Secondary | ICD-10-CM | POA: Insufficient documentation

## 2021-09-26 DIAGNOSIS — E782 Mixed hyperlipidemia: Secondary | ICD-10-CM | POA: Insufficient documentation

## 2021-09-26 DIAGNOSIS — D472 Monoclonal gammopathy: Secondary | ICD-10-CM | POA: Insufficient documentation

## 2021-09-26 DIAGNOSIS — Z803 Family history of malignant neoplasm of breast: Secondary | ICD-10-CM | POA: Insufficient documentation

## 2021-09-26 DIAGNOSIS — E669 Obesity, unspecified: Secondary | ICD-10-CM | POA: Insufficient documentation

## 2021-09-26 DIAGNOSIS — I120 Hypertensive chronic kidney disease with stage 5 chronic kidney disease or end stage renal disease: Secondary | ICD-10-CM | POA: Insufficient documentation

## 2021-09-27 ENCOUNTER — Other Ambulatory Visit: Payer: Self-pay | Admitting: Internal Medicine

## 2021-09-27 ENCOUNTER — Ambulatory Visit
Admission: RE | Admit: 2021-09-27 | Discharge: 2021-09-27 | Disposition: A | Discharge status: Home / Self Care | Payer: Medicare HMO | Source: Ambulatory Visit | Attending: Internal Medicine | Admitting: Internal Medicine

## 2021-09-27 DIAGNOSIS — D3500 Benign neoplasm of unspecified adrenal gland: Secondary | ICD-10-CM

## 2021-09-27 DIAGNOSIS — D352 Benign neoplasm of pituitary gland: Secondary | ICD-10-CM

## 2021-09-29 DIAGNOSIS — Z992 Dependence on renal dialysis: Secondary | ICD-10-CM | POA: Diagnosis not present

## 2021-09-29 DIAGNOSIS — E119 Type 2 diabetes mellitus without complications: Secondary | ICD-10-CM | POA: Diagnosis not present

## 2021-09-29 DIAGNOSIS — N2581 Secondary hyperparathyroidism of renal origin: Secondary | ICD-10-CM | POA: Diagnosis not present

## 2021-09-29 DIAGNOSIS — N186 End stage renal disease: Secondary | ICD-10-CM | POA: Diagnosis not present

## 2021-10-01 ENCOUNTER — Other Ambulatory Visit (HOSPITAL_COMMUNITY): Payer: Self-pay | Admitting: Physician Assistant

## 2021-10-01 DIAGNOSIS — Z992 Dependence on renal dialysis: Secondary | ICD-10-CM | POA: Diagnosis not present

## 2021-10-01 DIAGNOSIS — R7989 Other specified abnormal findings of blood chemistry: Secondary | ICD-10-CM

## 2021-10-01 DIAGNOSIS — N2581 Secondary hyperparathyroidism of renal origin: Secondary | ICD-10-CM | POA: Diagnosis not present

## 2021-10-01 DIAGNOSIS — N186 End stage renal disease: Secondary | ICD-10-CM | POA: Diagnosis not present

## 2021-10-01 NOTE — Progress Notes (Deleted)
Kathryn Beck, [place] [address]   CLINIC:  Medical Oncology/Hematology  PCP:  Fayrene Helper, MD [address] [place] [address] [phone]   REASON FOR VISIT:  Follow-up for questionable MGUS and normocytic anemia  CURRENT THERAPY: Observation  INTERVAL HISTORY:  Kathryn Beck 66 y.o. female returns for routine follow-up of MGUS and normocytic anemia.  She was last evaluated via telemedicine visit by NP Beckey Rutter on 11/14/2020.  At today's visit, she reports feeling ***.  No recent hospitalizations, surgeries, or changes in baseline health status.  ***She denies any new bone pain or recent fractures. ***She denies any B symptoms such as fever, chills, night sweats, unintentional weight loss..   ***No new neurologic symptoms such as tinnitus, new-onset hearing loss, blurred vision, headache, or dizziness.  Denies any numbness or tingling in hands or feet. ***No thromboembolic events since her last visit.  ***No new masses or lymphadenopathy per her report.  *** Bleeding *** Fatigue *** Pica, RLS, headaches *** CP, DOE, LH, syncope   *** IV iron with dialysis? *** Iron pill? *** Liver disease? *** Alcohol? *** Family history hemochromatosis?? *** Procrit at dialysis??  She has ***% energy and ***% appetite. She endorses that she is maintaining a stable weight.    REVIEW OF SYSTEMS: *** Review of Systems - Oncology    PAST MEDICAL/SURGICAL HISTORY:  Past Medical History:  Diagnosis Date   Acid reflux    Anemia    Arthritis    Axillary masses    Soft tissue - status post excision   Back pain    COVID-19 virus infection 04/06/2019   Depression    End-stage renal disease ([place])    TTHSat  in [place] hypertension    History of blood transfusion    History of cardiac catheterization    Normal coronary arteries October 2020   History of claustrophobia    History of pneumonia 2019    Hypoxia 04/03/2019   Mixed hyperlipidemia    Obesity    Pancreatitis    Peritoneal dialysis catheter in place [facility])    Pneumonia due to COVID-19 virus 04/02/2019   Sleep apnea    Noncompliant with CPAP   Type 2 diabetes mellitus ([place])    Past Surgical History:  Procedure Laterality Date   ABDOMINAL HYSTERECTOMY     AV FISTULA PLACEMENT Left 09/02/2017   Procedure: creation of left arm ARTERIOVENOUS (AV) FISTULA;  Surgeon: Serafina Mitchell, MD;  Location: [facility];  Service: Vascular;  Laterality: Left;   COLONOSCOPY  2008   Dr. Oneida Alar: normal    COLONOSCOPY N/A 12/18/2016   Dr. Oneida Alar: multiple tubular adenomas, internal hemorrhoids. Surveillance in 3 years    ESOPHAGEAL DILATION N/A 10/13/2015   Procedure: ESOPHAGEAL DILATION;  Surgeon: Rogene Houston, MD;  [facility];  Service: Endoscopy;  Laterality: N/A;   ESOPHAGOGASTRODUODENOSCOPY N/A 10/13/2015   Dr. Laural Golden: chronic gastritis on path, no H.pylori. Empiric dilation    ESOPHAGOGASTRODUODENOSCOPY N/A 12/18/2016   Dr. Oneida Alar: mild gastritis. BRAVO study revealed uncontrolled GERD. Dysphagia secondary to uncontrolled reflux   FOOT SURGERY Bilateral    "nerve"     LEFT HEART CATH AND CORONARY ANGIOGRAPHY N/A 12/29/2018   Procedure: LEFT HEART CATH AND CORONARY ANGIOGRAPHY;  Surgeon: Jettie Booze, MD;  Location: [facility];  Service: Cardiovascular;  Laterality: N/A;   LUNG BIOPSY     MASS EXCISION Right 01/09/2013  Procedure: EXCISION OF NEOPLASM OF RIGHT  AXILLA  AND EXCISION OF NEOPLASM OF LEFT AXILLA;  Surgeon: Jamesetta So, MD;  Location: [facility];  Service: General;  Laterality: Right;  procedure end @ 08:23   MYRINGOTOMY WITH TUBE PLACEMENT Bilateral 04/28/2017   Procedure: BILATERAL MYRINGOTOMY WITH TUBE PLACEMENT;  Surgeon: Leta Baptist, MD;  Location: [facility];  Service: ENT;  Laterality: Bilateral;   REVISION OF ARTERIOVENOUS GORETEX GRAFT Left 05/04/2018   Procedure: TRANSPOSITION OF CEPHALIC VEIN  ARTERIOVENOUS FISTULA LEFT ARM;  Surgeon: Rosetta Posner, MD;  Location: [facility];  Service: Vascular;  Laterality: Left;   SAVORY DILATION N/A 12/18/2016   Procedure: SAVORY DILATION;  Surgeon: Danie Binder, MD;  [facility];  Service: Endoscopy;  Laterality: N/A;     SOCIAL HISTORY:  Social History   Socioeconomic History   Marital status: Married    Spouse name: larry    Number of children: 2   Years of education: 12   Highest education level: 12th grade  Occupational History   Occupation: retired   Tobacco Use   Smoking status: Never   Smokeless tobacco: Never  Vaping Use   Vaping Use: Never used  Substance and Sexual Activity   Alcohol use: No   Drug use: No   Sexual activity: Not Currently  Other Topics Concern   Not on file  Social History Narrative   Lives alone with husband    Social Determinants of Health   Financial Resource Strain: High Risk (05/08/2021)   Overall Financial Resource Strain (CARDIA)    Difficulty of Paying Living Expenses: Hard  Food Insecurity: Food Insecurity Present (05/08/2021)   Hunger Vital Sign    Worried About Running Out of Food in the Last Year: Sometimes true    Ran Out of Food in the Last Year: Never true  Transportation Needs: Unmet Transportation Needs (05/08/2021)   PRAPARE - Transportation    Lack of Transportation (Medical): Yes    Lack of Transportation (Non-Medical): Yes  Physical Activity: Inactive (09/11/2021)   Exercise Vital Sign    Days of Exercise per Week: 0 days    Minutes of Exercise per Session: 0 min  Stress: Stress Concern Present (09/11/2021)   Grandfather    Feeling of Stress : To some extent  Social Connections: Socially Integrated (05/08/2021)   Social Connection and Isolation Panel [NHANES]    Frequency of Communication with Friends and Family: More than three times a week    Frequency of Social Gatherings with Friends and Family:  Once a week    Attends Religious Services: More than 4 times per year    Active Member of [employer] or Organizations: Yes    Attends Archivist Meetings: 1 to 4 times per year    Marital Status: Married  Human resources officer Violence: Not At Risk (05/08/2021)   Humiliation, Afraid, Rape, and Kick questionnaire    Fear of Current or Ex-Partner: No    Emotionally Abused: No    Physically Abused: No    Sexually Abused: No    FAMILY HISTORY:  Family History  Problem Relation Age of Onset   Hypertension Father    Hypercholesterolemia Father    Arthritis Father    Hypertension Sister    Hypercholesterolemia Sister    Breast cancer Sister    Hypertension Sister    Colon cancer Neg Hx    Colon polyps Neg Hx  CURRENT MEDICATIONS:  Outpatient Encounter Medications as of 10/02/2021  Medication Sig   amLODipine (NORVASC) 5 MG tablet Take 1 tablet by mouth twice daily.**DO NOT TAKE AM DOSE ON DIALYSIS DAYS** (Patient not taking: Reported on 09/18/2021)   aspirin 81 MG chewable tablet CHEW 2 TABLETS BY MOUTH ONCE DAILY   B Complex-C-Folic Acid (RENA-VITE RX) 1 MG TABS Take 1 tablet by mouth daily.   benzonatate (TESSALON) 100 MG capsule Take 1 capsule (100 mg total) by mouth 2 (two) times daily as needed for cough.   calcitRIOL (ROCALTROL) 0.25 MCG capsule Take 0.25 mcg by mouth daily. (Patient not taking: Reported on 09/18/2021)   cloNIDine (CATAPRES) 0.2 MG tablet Take 0.2 mg by mouth 2 (two) times daily. (Patient not taking: Reported on 09/18/2021)   cyclobenzaprine (FLEXERIL) 5 MG tablet TAKE ONE TABLET BY MOUTH AT BEDTIME AS NEEDED FOR MUSCLE SPASMS.   DULoxetine (CYMBALTA) 60 MG capsule TAKE (1) CAPSULE BY MOUTH ONCE DAILY. (Patient taking differently: Take 60 mg by mouth daily.)   ezetimibe (ZETIA) 10 MG tablet TAKE 1 TABLET BY MOUTH ONCE DAILY. (Patient taking differently: Take 10 mg by mouth daily.)   fluticasone (FLONASE) 50 MCG/ACT nasal spray Place 2 sprays into both nostrils  daily. (Patient not taking: Reported on 09/18/2021)   HYDROcodone-acetaminophen (NORCO/VICODIN) 5-325 MG tablet Take 1 tablet by mouth every 6 (six) hours as needed for moderate pain. (Patient not taking: Reported on 09/18/2021)   insulin aspart (FIASP FLEXTOUCH) 100 UNIT/ML FlexTouch Pen Inject 18-30 Units into the skin with breakfast, with lunch, and with evening meal.   insulin glargine, 2 Unit Dial, (TOUJEO MAX SOLOSTAR) 300 UNIT/ML Solostar Pen Inject 60 Units into the skin daily.   Insulin Pen Needle 32G X 4 MM MISC Use 4x a day   isosorbide mononitrate (IMDUR) 30 MG 24 hr tablet Take 1 tablet (30 mg total) by mouth daily.   Melatonin 3 MG TBDP Take one tablet by mouth at bedtime , for sleep (Patient taking differently: Take 3 mg by mouth at bedtime as needed (Sleep).)   metoprolol succinate (TOPROL-XL) 50 MG 24 hr tablet TAKE (1) TABLET BY MOUTH DAILY WITH FOOD *TAKE AFTER DIALYSIS*   midodrine (PROAMATINE) 10 MG tablet TAKE 1 TABLET BY MOUTH TWICE DAILY (Patient not taking: Reported on 09/18/2021)   ofloxacin (FLOXIN) 0.3 % OTIC solution SMARTSIG:10 Drop(s) In Ear(s) Daily (Patient not taking: Reported on 09/02/2021)   ondansetron (ZOFRAN) 4 MG tablet Take 1 tablet (4 mg total) by mouth every 8 (eight) hours as needed for nausea or vomiting. (Patient not taking: Reported on 09/05/2021)   pregabalin (LYRICA) 50 MG capsule TAKE (1) CAPSULE BY MOUTH THREE TIMES DAILY (Patient taking differently: Take 50 mg by mouth 3 (three) times daily.)   sertraline (ZOLOFT) 100 MG tablet Take 1 tablet (100 mg total) by mouth daily.   sevelamer carbonate (RENVELA) 800 MG tablet Take 800 mg by mouth 3 (three) times daily with meals. (Patient not taking: Reported on 09/18/2021)   sulfamethoxazole-trimethoprim (BACTRIM DS) 800-160 MG tablet Take 1 tablet by mouth 2 (two) times daily.   torsemide (DEMADEX) 100 MG tablet TAKE 1 TABLET BY MOUTH TWICE DAILY *HOLD MORNING DOSE ON DIALYSIS DAYS* (Patient not taking: Reported  on 09/18/2021)   triamcinolone cream (KENALOG) 0.1 % APPLY TO AFFECTED AREA TWICE A DAY (Patient not taking: Reported on 09/05/2021)   [name] 500 MG chewable tablet Chew 500 mg by mouth See admin instructions. Take 500 mg with each snack and meal (  Patient not taking: Reported on 09/05/2021)   zolpidem (AMBIEN) 5 MG tablet TAKE 1 TABLET BY MOUTH AT BEDTIME AS NEEDED. (Patient not taking: Reported on 09/05/2021)   [DISCONTINUED] FLUoxetine (PROZAC) 10 MG capsule Take 10 mg by mouth daily.     [DISCONTINUED] glipiZIDE (GLUCOTROL) 10 MG tablet Take 10 mg by mouth 2 (two) times daily before a meal.     Facility-Administered Encounter Medications as of 10/02/2021  Medication   0.9 %  sodium chloride infusion    ALLERGIES:  Allergies  Allergen Reactions   Ace Inhibitors Anaphylaxis and Swelling   Penicillins Itching, Swelling and Other (See Comments)    Did it involve swelling of the face/tongue/throat, SOB, or low BP? Unknown Did it involve sudden or severe rash/hives, skin peeling, or any reaction on the inside of your mouth or nose? Unknown Did you need to seek medical attention at a hospital or doctor's office? Unknown When did it last happen?      years  If all above answers are "NO", may proceed with cephalosporin use.    Statins Other (See Comments)    elevated LFT's     Albuterol Swelling     PHYSICAL EXAM: *** ECOG PERFORMANCE STATUS: {CHL ONC ECOG [phone]}  There were no vitals filed for this visit. There were no vitals filed for this visit. Physical Exam   LABORATORY DATA:  I have reviewed the labs as listed.  CBC    Component Value Date/Time   WBC 6.6 09/05/2021 1008   RBC 4.40 09/05/2021 1008   HGB 10.8 (L) 09/05/2021 1008   HCT 36.0 09/05/2021 1008   PLT 330 09/05/2021 1008   MCV 81.8 09/05/2021 1008   MCH 24.5 (L) 09/05/2021 1008   MCHC 30.0 09/05/2021 1008   RDW 20.6 (H) 09/05/2021 1008   LYMPHSABS 1.6 09/05/2021 1008   MONOABS 0.7 09/05/2021 1008    EOSABS 0.1 09/05/2021 1008   BASOSABS 0.0 09/05/2021 1008      Latest Ref Rng & Units 09/05/2021   10:08 AM 07/06/2021    9:35 AM 06/10/2021   10:42 AM  CMP  Glucose 70 - 99 mg/dL 204  287  192   BUN 8 - 23 mg/dL 24  24  23    Creatinine 0.44 - 1.00 mg/dL 4.38  5.06  6.52   Sodium 135 - 145 mmol/L 141  141  142   Potassium 3.5 - 5.1 mmol/L 3.2  3.7  3.8   Chloride 98 - 111 mmol/L 99  102  103   CO2 22 - 32 mmol/L 29  26  26    Calcium 8.9 - 10.3 mg/dL 9.0  10.1  9.6   Total Protein 6.5 - 8.1 g/dL  7.9  7.4   Total Bilirubin 0.3 - 1.2 mg/dL  0.6  0.3   Alkaline Phos 38 - 126 U/L  109  104   AST 15 - 41 U/L  32  22   ALT 0 - 44 U/L  44  29     DIAGNOSTIC IMAGING:  I have independently reviewed the relevant imaging and discussed with the patient.  ASSESSMENT & PLAN: 1.  IgG Kappa MGUS, questionable - Patient with a history of ESRD on dialysis, work-up for monoclonal gammopathy on 08/09/2017 showed an M spike of 0.1 g/dL of IgG kappa monoclonal protein.   - M-Spike has been intermittently elevated, peaked at 0.4 in August 2019 - has been undetectable since April 2021 -  Immunofixation initially showed biclonal IgG  kappa, has since showed polyclonal gammopathy and was negative in 2022 and 2023 - Bone marrow biopsy done on 03/03/2019 showed slightly hypercellular marrow (50% with trilineage hematopoiesis) polytypic plasmacytosis. - Skeletal survey done on 06/20/2021 showed no lytic lesions. - Most recent MGUS/myeloma labs (06/10/2021) were undetectable.  Immunofixation was negative.  No M spike evident on SPEP.  Elevated kappa (226.1) and lambda (105.2) light chains in keeping with ESRD with ratio 2.15. -- No definitive CRAB features at this time: Elevated creatinine 6.52 attributable to ESRD of other causes.  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  Calcium 9.6.  Mildly elevated LDH (263). -- Symptoms *** - Diagnosis of MGUS is somewhat questionable - she may have previously had  some reactive polyclonal gammopathy, but most recent labs do not show any sign of MGUS - PLAN: Repeat MGUS/myeloma labs annually along with skeletal survey - next due March 2024.  2.  ESRD on dialysis - Secondary to a long history of diabetes, hypertension, obesity related glomerulopathy. - PLAN: Continue dialysis, nephrology follow-up, and follow-up with transplant team [facility]  3.  Normocytic anemia: - She did receive 2 units of PRBC during hospitalization in June 2019.  She also received Feraheme while hospitalized in June 2019. - She received Procrit injections in 2019, but for some time after that had improved and was no longer needing them - She was restarted on Procrit when she was placed on dialysis *** - Most recent CBC (10/02/2021): *** - PLAN: Hemoglobin at goal.  Continue with Procrit via nephrologist.  4.  Elevated ferritin - Moderately elevated ferritin since at least 2012, with marked increase from 2021 onwards - Most recent iron panel (10/02/2021): *** - Iron supplements or IV iron at dialysis *** - Family history of hemochromatosis *** - History of liver disease or alcohol use *** - Most recent CMP (10/02/2021): *** LFTs - PLAN: Suspect elevated ferritin related to ESRD, possibly iatrogenic in the setting of iron supplementation.  *** - We will check hemochromatosis mutational analysis for completion. - No indication for liver imaging at this time, unless patient has any abnormal LFTs.    PLAN SUMMARY & DISPOSITION: ***  All questions were answered. The patient knows to call the clinic with any problems, questions or concerns.  Medical decision making: ***  Time spent on visit: I spent {CHL ONC TIME VISIT - JSEGB:[id number]} counseling the patient face to face. The total time spent in the appointment was {CHL ONC TIME VISIT - XTGGY:[id number]} and more than 50% was on counseling.   Harriett Rush, PA-C  ***

## 2021-10-01 NOTE — Progress Notes (Unsigned)
[facility] [address], [place] [address]   CLINIC:  Medical Oncology/Hematology  PCP:  [name], MD 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 66 y.o. female returns for routine follow-up of MGUS and normocytic anemia.  She was last evaluated via telemedicine visit by NP Consuello Masse on 11/14/2020.   At today's visit, she reports feeling fair.  No recent hospitalizations, surgeries, or changes in baseline health status.   She denies any new bone pain or recent fractures. She denies any B symptoms such as fever, chills, night sweats, unintentional weight loss. No new neurologic symptoms.  She has chronic diabetic neuropathy.  No thromboembolic events since her last visit.  No new masses or lymphadenopathy per her report.  She denies any signs of bleeding such as bright red blood per rectum or melena.  She has had ongoing fatigue and mild dyspnea on exertion ever since she had COVID-19.  She has occasional lightheadedness and near syncopal episodes when her blood pressure drops during dialysis.  She denies any pica, restless legs, or headaches.   She previously took iron pill, but denies any current iron supplementation.  She does not think that she gets any IV iron at dialysis.  She denies any history of liver disease, alcohol consumption, or family hemochromatosis.  She is not sure if she receives Procrit at dialysis, but reports that they monitor her anemia closely and treat as needed.   She has 50% energy and 100% appetite. She endorses that she has intentionally lost about 15 pounds over the past year with diet and exercise.   REVIEW OF SYSTEMS:  Review of Systems  Constitutional:  Positive for fatigue. Negative for appetite change, chills, diaphoresis, fever and unexpected weight change.   HENT:   Negative for lump/mass and nosebleeds.   Eyes:  Negative for eye problems.  Respiratory:  Positive for cough and shortness of breath. Negative for hemoptysis.   Cardiovascular:  Negative for chest pain, leg swelling and palpitations.  Gastrointestinal:  Positive for constipation, diarrhea, nausea and vomiting. Negative for abdominal pain and blood in stool.  Genitourinary:  Negative for hematuria.   Skin: Negative.   Neurological:  Positive for dizziness and numbness. Negative for headaches and light-headedness.  Hematological:  Does not bruise/bleed easily.  Psychiatric/Behavioral:  Positive for sleep disturbance.      PAST MEDICAL/SURGICAL HISTORY:  Past Medical History:  Diagnosis Date   Acid reflux    Anemia    Arthritis    Axillary masses    Soft tissue - status post excision   Back pain    COVID-19 virus infection 04/06/2019   Depression    End-stage renal disease (HCC)    TTHSat  in [place]   Essential hypertension    History of blood transfusion    History of cardiac catheterization    Normal coronary arteries October 2020   History of claustrophobia    History of pneumonia 2019   Hypoxia 04/03/2019   Mixed hyperlipidemia    Obesity    Pancreatitis    Peritoneal dialysis catheter in place [facility])    Pneumonia due to COVID-19 virus 04/02/2019   Sleep apnea    Noncompliant with CPAP   Type 2 diabetes mellitus (HCC)    Past Surgical History:  Procedure Laterality Date   ABDOMINAL  HYSTERECTOMY     AV FISTULA PLACEMENT Left 09/02/2017   Procedure: creation of left arm ARTERIOVENOUS (AV) FISTULA;  Surgeon: Nada Libman, MD;  Location: [facility] OR;  Service: Vascular;  Laterality: Left;   COLONOSCOPY  2008   Dr. Darrick Penna: normal    COLONOSCOPY N/A 12/18/2016   Dr. Darrick Penna: multiple tubular adenomas, internal hemorrhoids. Surveillance in 3 years    ESOPHAGEAL DILATION N/A 10/13/2015   Procedure: ESOPHAGEAL DILATION;  Surgeon: Malissa Hippo, MD;  Location: [facility] ENDO  SUITE;  Service: Endoscopy;  Laterality: N/A;   ESOPHAGOGASTRODUODENOSCOPY N/A 10/13/2015   Dr. Karilyn Cota: chronic gastritis on path, no H.pylori. Empiric dilation    ESOPHAGOGASTRODUODENOSCOPY N/A 12/18/2016   Dr. Darrick Penna: mild gastritis. BRAVO study revealed uncontrolled GERD. Dysphagia secondary to uncontrolled reflux   FOOT SURGERY Bilateral    "nerve"     LEFT HEART CATH AND CORONARY ANGIOGRAPHY N/A 12/29/2018   Procedure: LEFT HEART CATH AND CORONARY ANGIOGRAPHY;  Surgeon: Corky Crafts, MD;  [facility] INVASIVE CV LAB;  Service: Cardiovascular;  Laterality: N/A;   LUNG BIOPSY     MASS EXCISION Right 01/09/2013   Procedure: EXCISION OF NEOPLASM OF RIGHT  AXILLA  AND EXCISION OF NEOPLASM OF LEFT AXILLA;  Surgeon: Dalia Heading, MD;  Location: [facility] ORS;  Service: General;  Laterality: Right;  procedure end @ 08:23   MYRINGOTOMY WITH TUBE PLACEMENT Bilateral 04/28/2017   Procedure: BILATERAL MYRINGOTOMY WITH TUBE PLACEMENT;  Surgeon: Newman Pies, MD;  Location: [facility] OR;  Service: ENT;  Laterality: Bilateral;   REVISION OF ARTERIOVENOUS GORETEX GRAFT Left 05/04/2018   Procedure: TRANSPOSITION OF CEPHALIC VEIN ARTERIOVENOUS FISTULA LEFT ARM;  Surgeon: Larina Earthly, MD;  Location: [facility] OR;  Service: Vascular;  Laterality: Left;   SAVORY DILATION N/A 12/18/2016   Procedure: SAVORY DILATION;  Surgeon: West Bali, MD;  Location: [facility] ENDO SUITE;  Service: Endoscopy;  Laterality: N/A;     SOCIAL HISTORY:  Social History   Socioeconomic History   Marital status: Married    Spouse name: larry    Number of children: 2   Years of education: 12   Highest education level: 12th grade  Occupational History   Occupation: retired   Tobacco Use   Smoking status: Never   Smokeless tobacco: Never  Vaping Use   Vaping Use: Never used  Substance and Sexual Activity   Alcohol use: No   Drug use: No   Sexual activity: Not Currently  Other Topics Concern   Not on file  Social History Narrative    Lives alone with husband    Social Determinants of Health   Financial Resource Strain: High Risk (05/08/2021)   Overall Financial Resource Strain (CARDIA)    Difficulty of Paying Living Expenses: Hard  Food Insecurity: Food Insecurity Present (05/08/2021)   Hunger Vital Sign    Worried About Running Out of Food in the Last Year: Sometimes true    Ran Out of Food in the Last Year: Never true  Transportation Needs: Unmet Transportation Needs (05/08/2021)   PRAPARE - Transportation    Lack of Transportation (Medical): Yes    Lack of Transportation (Non-Medical): Yes  Physical Activity: Inactive (09/11/2021)   Exercise Vital Sign    Days of Exercise per Week: 0 days    Minutes of Exercise per Session: 0 min  Stress: Stress Concern Present (09/11/2021)   [employer] of Occupational Health - Occupational Stress Questionnaire    Feeling of Stress : To some extent  Social Connections: Socially Integrated (05/08/2021)   Social Connection and Isolation Panel [NHANES]    Frequency of Communication with Friends and Family: More than three times a week    Frequency of Social Gatherings with Friends and Family: Once a week    Attends Religious Services: More than 4 times per year    Active Member of [employer] or Organizations: Yes    Attends Banker Meetings: 1 to 4 times per year    Marital Status: Married  Catering manager Violence: Not At Risk (05/08/2021)   Humiliation, Afraid, Rape, and Kick questionnaire    Fear of Current or Ex-Partner: No    Emotionally Abused: No    Physically Abused: No    Sexually Abused: No    FAMILY HISTORY:  Family History  Problem Relation Age of Onset   Hypertension Father    Hypercholesterolemia Father    Arthritis Father    Hypertension Sister    Hypercholesterolemia Sister    Breast cancer Sister    Hypertension Sister    Colon cancer Neg Hx    Colon polyps Neg Hx     CURRENT MEDICATIONS:  Outpatient Encounter Medications as of  10/02/2021  Medication Sig   amLODipine (NORVASC) 5 MG tablet Take 1 tablet by mouth twice daily.**DO NOT TAKE AM DOSE ON DIALYSIS DAYS** (Patient not taking: Reported on 09/18/2021)   aspirin 81 MG chewable tablet CHEW 2 TABLETS BY MOUTH ONCE DAILY   B Complex-C-Folic Acid (RENA-VITE RX) 1 MG TABS Take 1 tablet by mouth daily.   benzonatate (TESSALON) 100 MG capsule Take 1 capsule (100 mg total) by mouth 2 (two) times daily as needed for cough.   calcitRIOL (ROCALTROL) 0.25 MCG capsule Take 0.25 mcg by mouth daily. (Patient not taking: Reported on 09/18/2021)   cloNIDine (CATAPRES) 0.2 MG tablet Take 0.2 mg by mouth 2 (two) times daily. (Patient not taking: Reported on 09/18/2021)   cyclobenzaprine (FLEXERIL) 5 MG tablet TAKE ONE TABLET BY MOUTH AT BEDTIME AS NEEDED FOR MUSCLE SPASMS.   DULoxetine (CYMBALTA) 60 MG capsule TAKE (1) CAPSULE BY MOUTH ONCE DAILY. (Patient taking differently: Take 60 mg by mouth daily.)   ezetimibe (ZETIA) 10 MG tablet TAKE 1 TABLET BY MOUTH ONCE DAILY. (Patient taking differently: Take 10 mg by mouth daily.)   fluticasone (FLONASE) 50 MCG/ACT nasal spray Place 2 sprays into both nostrils daily. (Patient not taking: Reported on 09/18/2021)   HYDROcodone-acetaminophen (NORCO/VICODIN) 5-325 MG tablet Take 1 tablet by mouth every 6 (six) hours as needed for moderate pain. (Patient not taking: Reported on 09/18/2021)   insulin aspart (FIASP FLEXTOUCH) 100 UNIT/ML FlexTouch Pen Inject 18-30 Units into the skin with breakfast, with lunch, and with evening meal.   insulin glargine, 2 Unit Dial, (TOUJEO MAX SOLOSTAR) 300 UNIT/ML Solostar Pen Inject 60 Units into the skin daily.   Insulin Pen Needle 32G X 4 MM MISC Use 4x a day   isosorbide mononitrate (IMDUR) 30 MG 24 hr tablet Take 1 tablet (30 mg total) by mouth daily.   Melatonin 3 MG TBDP Take one tablet by mouth at bedtime , for sleep (Patient taking differently: Take 3 mg by mouth at bedtime as needed (Sleep).)   metoprolol  succinate (TOPROL-XL) 50 MG 24 hr tablet TAKE (1) TABLET BY MOUTH DAILY WITH FOOD *TAKE AFTER DIALYSIS*   midodrine (PROAMATINE) 10 MG tablet TAKE 1 TABLET BY MOUTH TWICE DAILY (Patient not taking: Reported on 09/18/2021)   ofloxacin (FLOXIN) 0.3 % OTIC solution SMARTSIG:10  Drop(s) In Ear(s) Daily (Patient not taking: Reported on 09/02/2021)   ondansetron (ZOFRAN) 4 MG tablet Take 1 tablet (4 mg total) by mouth every 8 (eight) hours as needed for nausea or vomiting. (Patient not taking: Reported on 09/05/2021)   pregabalin (LYRICA) 50 MG capsule TAKE (1) CAPSULE BY MOUTH THREE TIMES DAILY (Patient taking differently: Take 50 mg by mouth 3 (three) times daily.)   sertraline (ZOLOFT) 100 MG tablet Take 1 tablet (100 mg total) by mouth daily.   sevelamer carbonate (RENVELA) 800 MG tablet Take 800 mg by mouth 3 (three) times daily with meals. (Patient not taking: Reported on 09/18/2021)   sulfamethoxazole-trimethoprim (BACTRIM DS) 800-160 MG tablet Take 1 tablet by mouth 2 (two) times daily.   torsemide (DEMADEX) 100 MG tablet TAKE 1 TABLET BY MOUTH TWICE DAILY *HOLD MORNING DOSE ON DIALYSIS DAYS* (Patient not taking: Reported on 09/18/2021)   triamcinolone cream (KENALOG) 0.1 % APPLY TO AFFECTED AREA TWICE A DAY (Patient not taking: Reported on 09/05/2021)   [name] 500 MG chewable tablet Chew 500 mg by mouth See admin instructions. Take 500 mg with each snack and meal (Patient not taking: Reported on 09/05/2021)   zolpidem (AMBIEN) 5 MG tablet TAKE 1 TABLET BY MOUTH AT BEDTIME AS NEEDED. (Patient not taking: Reported on 09/05/2021)   [DISCONTINUED] FLUoxetine (PROZAC) 10 MG capsule Take 10 mg by mouth daily.     [DISCONTINUED] glipiZIDE (GLUCOTROL) 10 MG tablet Take 10 mg by mouth 2 (two) times daily before a meal.     Facility-Administered Encounter Medications as of 10/02/2021  Medication   0.9 %  sodium chloride infusion    ALLERGIES:  Allergies  Allergen Reactions   Ace Inhibitors Anaphylaxis and  Swelling   Penicillins Itching, Swelling and Other (See Comments)    Did it involve swelling of the face/tongue/throat, SOB, or low BP? Unknown Did it involve sudden or severe rash/hives, skin peeling, or any reaction on the inside of your mouth or nose? Unknown Did you need to seek medical attention at a hospital or doctor's office? Unknown When did it last happen?      years  If all above answers are "NO", may proceed with cephalosporin use.    Statins Other (See Comments)    elevated LFT's     Albuterol Swelling     PHYSICAL EXAM:  ECOG PERFORMANCE STATUS: 1 - Symptomatic but completely ambulatory  There were no vitals filed for this visit. There were no vitals filed for this visit. Physical Exam Constitutional:      Appearance: Normal appearance. She is obese.  HENT:     Head: Normocephalic and atraumatic.     Mouth/Throat:     Mouth: Mucous membranes are moist.  Eyes:     Extraocular Movements: Extraocular movements intact.     Pupils: Pupils are equal, round, and reactive to light.  Cardiovascular:     Rate and Rhythm: Normal rate and regular rhythm.     Pulses: Normal pulses.     Heart sounds: Murmur heard.  Pulmonary:     Effort: Pulmonary effort is normal.     Breath sounds: Normal breath sounds.  Abdominal:     General: Bowel sounds are normal.     Palpations: Abdomen is soft.     Tenderness: There is no abdominal tenderness.  Musculoskeletal:        General: No swelling.     Right lower leg: No edema.     Left lower leg: No edema.  Lymphadenopathy:     Cervical: No cervical adenopathy.  Skin:    General: Skin is warm and dry.  Neurological:     General: No focal deficit present.     Mental Status: She is alert and oriented to person, place, and time.  Psychiatric:        Mood and Affect: Mood normal.        Behavior: Behavior normal.      LABORATORY DATA:  I have reviewed the labs as listed.  CBC    Component Value Date/Time   WBC 6.6  09/05/2021 1008   RBC 4.40 09/05/2021 1008   HGB 10.8 (L) 09/05/2021 1008   HCT 36.0 09/05/2021 1008   PLT 330 09/05/2021 1008   MCV 81.8 09/05/2021 1008   MCH 24.5 (L) 09/05/2021 1008   MCHC 30.0 09/05/2021 1008   RDW 20.6 (H) 09/05/2021 1008   LYMPHSABS 1.6 09/05/2021 1008   MONOABS 0.7 09/05/2021 1008   EOSABS 0.1 09/05/2021 1008   BASOSABS 0.0 09/05/2021 1008      Latest Ref Rng & Units 09/05/2021   10:08 AM 07/06/2021    9:35 AM 06/10/2021   10:42 AM  CMP  Glucose 70 - 99 mg/dL 161  096  045   BUN 8 - 23 mg/dL 24  24  23    Creatinine 0.44 - 1.00 mg/dL 4.09  8.11  9.14   Sodium 135 - 145 mmol/L 141  141  142   Potassium 3.5 - 5.1 mmol/L 3.2  3.7  3.8   Chloride 98 - 111 mmol/L 99  102  103   CO2 22 - 32 mmol/L 29  26  26    Calcium 8.9 - 10.3 mg/dL 9.0  78.2  9.6   Total Protein 6.5 - 8.1 g/dL  7.9  7.4   Total Bilirubin 0.3 - 1.2 mg/dL  0.6  0.3   Alkaline Phos 38 - 126 U/L  109  104   AST 15 - 41 U/L  32  22   ALT 0 - 44 U/L  44  29     DIAGNOSTIC IMAGING:  I have independently reviewed the relevant imaging and discussed with the patient.  ASSESSMENT & PLAN: 1.  IgG Kappa MGUS, questionable - Patient with a history of ESRD on dialysis, work-up for monoclonal gammopathy on 08/09/2017 showed an M spike of 0.1 g/dL of IgG kappa monoclonal protein.   - M-Spike has been intermittently elevated, peaked at 0.4 in August 2019 - has been undetectable since April 2021 -  Immunofixation initially showed biclonal IgG kappa, has since showed polyclonal gammopathy and was negative in 2022 and 2023 - Bone marrow biopsy done on 03/03/2019 showed slightly hypercellular marrow (50% with trilineage hematopoiesis) polytypic plasmacytosis. - Skeletal survey done on 06/20/2021 showed no lytic lesions. - Most recent MGUS/myeloma labs (06/10/2021) were undetectable.  Immunofixation was negative.  No M spike evident on SPEP.  Elevated kappa (226.1) and lambda (105.2) light chains in keeping with  ESRD with ratio 2.15. -- No definitive CRAB features at this time: Elevated creatinine 6.52 attributable to ESRD of other causes.  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  Calcium 9.6.  Mildly elevated LDH (263). --No bone pain, B symptoms, or neurologic changes.  She has chronic diabetic neuropathy. - Diagnosis of MGUS is somewhat questionable - she may have previously had some reactive polyclonal gammopathy, but most recent labs do not show any sign of MGUS - PLAN: Repeat MGUS/myeloma labs annually  along with skeletal survey - next due May/June 2024.   2.  ESRD on dialysis - Secondary to a long history of diabetes, hypertension, obesity related glomerulopathy. - PLAN: Continue dialysis, nephrology follow-up, and follow-up with [facility] team at [facility]   3.  Normocytic anemia: - She did receive 2 units of PRBC during hospitalization in June 2019.  She also received Feraheme while hospitalized in June 2019. - She received Procrit injections in 2019, but for some time after that had improved and was no longer needing them - She was restarted on Procrit when she was placed on dialysis  - Most recent CBC (10/02/2021): Hgb 11.3/MCV 84.8 - PLAN: Hemoglobin at goal.  Continue with Procrit via nephrologist.   4.  Elevated ferritin - Moderately elevated ferritin since at least 2012, with marked increase from 2021 onwards - Most recent iron panel (10/02/2021): Ferritin trending downward at 968, iron saturation 38% - She denies any current iron supplements or IV iron at dialysis  - No family history of hemochromatosis  - No personal history of liver disease or alcohol use  - Most recent CMP (10/02/2021): Normal LFTs - PLAN: Suspect elevated ferritin related to ESRD, possibly iatrogenic in the setting of iron supplementation.   - We will check hemochromatosis mutational analysis for completion. - No indication for liver imaging at this time, unless patient has any abnormal LFTs. - Repeat  iron panel with RTC in 6 months.   PLAN SUMMARY & DISPOSITION: Labs and RTC in 6 months    All questions were answered. The patient knows to call the clinic with any problems, questions or concerns.  Medical decision making: Moderate  Time spent on visit: I spent 20 minutes counseling the patient face to face. The total time spent in the appointment was 30 minutes and more than 50% was on counseling.   Carnella Guadalajara, PA-C  10/02/2021 10:25 AM

## 2021-10-02 ENCOUNTER — Ambulatory Visit
Admission: RE | Admit: 2021-10-02 | Discharge: 2021-10-02 | Disposition: A | Discharge status: Home / Self Care | Payer: Medicare HMO | Source: Ambulatory Visit | Attending: Internal Medicine | Admitting: Internal Medicine

## 2021-10-02 ENCOUNTER — Ambulatory Visit (HOSPITAL_COMMUNITY): Payer: Medicare HMO | Admitting: Physician Assistant

## 2021-10-02 ENCOUNTER — Other Ambulatory Visit (HOSPITAL_COMMUNITY): Payer: Self-pay

## 2021-10-02 ENCOUNTER — Encounter (HOSPITAL_COMMUNITY): Payer: Self-pay | Admitting: Hematology

## 2021-10-02 ENCOUNTER — Inpatient Hospital Stay (HOSPITAL_COMMUNITY): Payer: Medicare HMO

## 2021-10-02 ENCOUNTER — Other Ambulatory Visit (HOSPITAL_COMMUNITY): Payer: Medicare HMO

## 2021-10-02 ENCOUNTER — Inpatient Hospital Stay (HOSPITAL_BASED_OUTPATIENT_CLINIC_OR_DEPARTMENT_OTHER): Payer: Medicare HMO | Admitting: Physician Assistant

## 2021-10-02 VITALS — HR 78 | Temp 98.0°F | Resp 18 | Ht 64.0 in | Wt 190.3 lb

## 2021-10-02 DIAGNOSIS — D649 Anemia, unspecified: Secondary | ICD-10-CM | POA: Diagnosis not present

## 2021-10-02 DIAGNOSIS — D638 Anemia in other chronic diseases classified elsewhere: Secondary | ICD-10-CM

## 2021-10-02 DIAGNOSIS — D472 Monoclonal gammopathy: Secondary | ICD-10-CM

## 2021-10-02 DIAGNOSIS — Z794 Long term (current) use of insulin: Secondary | ICD-10-CM | POA: Diagnosis not present

## 2021-10-02 DIAGNOSIS — R7989 Other specified abnormal findings of blood chemistry: Secondary | ICD-10-CM

## 2021-10-02 DIAGNOSIS — Z7982 Long term (current) use of aspirin: Secondary | ICD-10-CM | POA: Diagnosis not present

## 2021-10-02 DIAGNOSIS — D3500 Benign neoplasm of unspecified adrenal gland: Secondary | ICD-10-CM

## 2021-10-02 DIAGNOSIS — Z8616 Personal history of COVID-19: Secondary | ICD-10-CM | POA: Diagnosis not present

## 2021-10-02 DIAGNOSIS — I120 Hypertensive chronic kidney disease with stage 5 chronic kidney disease or end stage renal disease: Secondary | ICD-10-CM | POA: Diagnosis not present

## 2021-10-02 DIAGNOSIS — E782 Mixed hyperlipidemia: Secondary | ICD-10-CM | POA: Diagnosis not present

## 2021-10-02 DIAGNOSIS — K219 Gastro-esophageal reflux disease without esophagitis: Secondary | ICD-10-CM | POA: Diagnosis not present

## 2021-10-02 DIAGNOSIS — E669 Obesity, unspecified: Secondary | ICD-10-CM | POA: Diagnosis not present

## 2021-10-02 DIAGNOSIS — E1140 Type 2 diabetes mellitus with diabetic neuropathy, unspecified: Secondary | ICD-10-CM | POA: Diagnosis not present

## 2021-10-02 DIAGNOSIS — E2740 Unspecified adrenocortical insufficiency: Secondary | ICD-10-CM | POA: Diagnosis not present

## 2021-10-02 DIAGNOSIS — Z803 Family history of malignant neoplasm of breast: Secondary | ICD-10-CM | POA: Diagnosis not present

## 2021-10-02 DIAGNOSIS — Z79899 Other long term (current) drug therapy: Secondary | ICD-10-CM | POA: Diagnosis not present

## 2021-10-02 DIAGNOSIS — E785 Hyperlipidemia, unspecified: Secondary | ICD-10-CM | POA: Diagnosis not present

## 2021-10-02 DIAGNOSIS — G4730 Sleep apnea, unspecified: Secondary | ICD-10-CM | POA: Diagnosis not present

## 2021-10-02 DIAGNOSIS — E1122 Type 2 diabetes mellitus with diabetic chronic kidney disease: Secondary | ICD-10-CM | POA: Diagnosis not present

## 2021-10-02 DIAGNOSIS — I739 Peripheral vascular disease, unspecified: Secondary | ICD-10-CM | POA: Diagnosis not present

## 2021-10-02 DIAGNOSIS — E237 Disorder of pituitary gland, unspecified: Secondary | ICD-10-CM | POA: Diagnosis not present

## 2021-10-02 DIAGNOSIS — N186 End stage renal disease: Secondary | ICD-10-CM | POA: Diagnosis not present

## 2021-10-02 DIAGNOSIS — I6782 Cerebral ischemia: Secondary | ICD-10-CM | POA: Diagnosis not present

## 2021-10-02 DIAGNOSIS — Z7984 Long term (current) use of oral hypoglycemic drugs: Secondary | ICD-10-CM | POA: Diagnosis not present

## 2021-10-02 LAB — IRON AND TIBC
Iron: 82 ug/dL (ref 28–170)
Saturation Ratios: 38 % — ABNORMAL HIGH (ref 10.4–31.8)
TIBC: 215 ug/dL — ABNORMAL LOW (ref 250–450)
UIBC: 133 ug/dL

## 2021-10-02 LAB — COMPREHENSIVE METABOLIC PANEL
ALT: 28 U/L (ref 0–44)
AST: 21 U/L (ref 15–41)
Albumin: 3.6 g/dL (ref 3.5–5.0)
Alkaline Phosphatase: 66 U/L (ref 38–126)
Anion gap: 12 (ref 5–15)
BUN: 32 mg/dL — ABNORMAL HIGH (ref 8–23)
CO2: 27 mmol/L (ref 22–32)
Calcium: 8.8 mg/dL — ABNORMAL LOW (ref 8.9–10.3)
Chloride: 102 mmol/L (ref 98–111)
Creatinine, Ser: 7.08 mg/dL — ABNORMAL HIGH (ref 0.44–1.00)
GFR, Estimated: 6 mL/min — ABNORMAL LOW (ref >60)
Glucose, Bld: 166 mg/dL — ABNORMAL HIGH (ref 70–99)
Potassium: 4.3 mmol/L (ref 3.5–5.1)
Sodium: 141 mmol/L (ref 135–145)
Total Bilirubin: 0.3 mg/dL (ref 0.3–1.2)
Total Protein: 6.6 g/dL (ref 6.5–8.1)

## 2021-10-02 LAB — CBC WITH DIFFERENTIAL/PLATELET
Abs Immature Granulocytes: 0.03 10*3/uL (ref 0.00–0.07)
Basophils Absolute: 0 10*3/uL (ref 0.0–0.1)
Basophils Relative: 0 %
Eosinophils Absolute: 0.1 10*3/uL (ref 0.0–0.5)
Eosinophils Relative: 1 %
HCT: 38 % (ref 36.0–46.0)
Hemoglobin: 11.3 g/dL — ABNORMAL LOW (ref 12.0–15.0)
Immature Granulocytes: 1 %
Lymphocytes Relative: 27 %
Lymphs Abs: 1.5 10*3/uL (ref 0.7–4.0)
MCH: 25.2 pg — ABNORMAL LOW (ref 26.0–34.0)
MCHC: 29.7 g/dL — ABNORMAL LOW (ref 30.0–36.0)
MCV: 84.8 fL (ref 80.0–100.0)
Monocytes Absolute: 0.5 10*3/uL (ref 0.1–1.0)
Monocytes Relative: 9 %
Neutro Abs: 3.5 10*3/uL (ref 1.7–7.7)
Neutrophils Relative %: 62 %
Platelets: 329 10*3/uL (ref 150–400)
RBC: 4.48 MIL/uL (ref 3.87–5.11)
RDW: 22.6 % — ABNORMAL HIGH (ref 11.5–15.5)
WBC: 5.6 10*3/uL (ref 4.0–10.5)
nRBC: 1.6 % — ABNORMAL HIGH (ref 0.0–0.2)

## 2021-10-02 LAB — FERRITIN: Ferritin: 968 ng/mL — ABNORMAL HIGH (ref 11–307)

## 2021-10-02 MED ORDER — GADOBENATE DIMEGLUMINE 529 MG/ML IV SOLN
10.0000 mL | Freq: Once | INTRAVENOUS | Status: AC | PRN
Start: 2021-10-02 — End: 2021-10-02
  Administered 2021-10-02: 10 mL via INTRAVENOUS

## 2021-10-02 NOTE — Patient Instructions (Addendum)
[place] at [facility] Discharge Instructions  You were seen today by Tarri Abernethy PA-C for your elevated iron, anemia, and abnormal protein.  Your abnormal protein levels have come back to normal.  We will check these levels again in about 1 year.  Your iron levels are still high, but are coming back toward normal.  Make sure that you do not take any extra iron tablets or receive any IV iron treatments at dialysis.  Your anemia is being managed very well by your kidney doctor, and your blood levels are at a good level.  FOLLOW-UP APPOINTMENT: Repeat labs in 6 months with office visit the following week.  Thank you for choosing [place] at [facility] to provide your oncology and hematology care.  To afford each patient quality time with our provider, please arrive at least 15 minutes before your scheduled appointment time.   If you have a lab appointment with the [facility] please come in thru the Main Entrance and check in at the main information desk.  You need to re-schedule your appointment should you arrive 10 or more minutes late.  We strive to give you quality time with our providers, and arriving late affects you and other patients whose appointments are after yours.  Also, if you no show three or more times for appointments you may be dismissed from the clinic at the providers discretion.     Again, thank you for choosing [facility].  Our hope is that these requests will decrease the amount of time that you wait before being seen by our physicians.       _____________________________________________________________  Should you have questions after your visit to [facility], please contact our office at [phone] and follow the prompts.  Our office hours are 8:00 a.m. and 4:30 p.m. Monday - Friday.  Please note that voicemails left after 4:00 p.m. may not be returned until the following business  day.  We are closed weekends and major holidays.  You do have access to a nurse 24-7, just call the main number to the clinic [phone] and do not press any options, hold on the line and a nurse will answer the phone.    For prescription refill requests, have your pharmacy contact our office and allow 72 hours.    Due to Covid, you will need to wear a mask upon entering the hospital. If you do not have a mask, a mask will be given to you at the Main Entrance upon arrival. For doctor visits, patients may have 1 support person age 3 or older with them. For treatment visits, patients can not have anyone with them due to social distancing guidelines and our immunocompromised population.

## 2021-10-03 DIAGNOSIS — N186 End stage renal disease: Secondary | ICD-10-CM | POA: Diagnosis not present

## 2021-10-03 DIAGNOSIS — N2581 Secondary hyperparathyroidism of renal origin: Secondary | ICD-10-CM | POA: Diagnosis not present

## 2021-10-03 DIAGNOSIS — Z992 Dependence on renal dialysis: Secondary | ICD-10-CM | POA: Diagnosis not present

## 2021-10-06 DIAGNOSIS — N2581 Secondary hyperparathyroidism of renal origin: Secondary | ICD-10-CM | POA: Diagnosis not present

## 2021-10-06 DIAGNOSIS — Z992 Dependence on renal dialysis: Secondary | ICD-10-CM | POA: Diagnosis not present

## 2021-10-06 DIAGNOSIS — N186 End stage renal disease: Secondary | ICD-10-CM | POA: Diagnosis not present

## 2021-10-08 DIAGNOSIS — I951 Orthostatic hypotension: Secondary | ICD-10-CM | POA: Diagnosis not present

## 2021-10-08 DIAGNOSIS — M542 Cervicalgia: Secondary | ICD-10-CM | POA: Diagnosis not present

## 2021-10-08 DIAGNOSIS — E1142 Type 2 diabetes mellitus with diabetic polyneuropathy: Secondary | ICD-10-CM | POA: Diagnosis not present

## 2021-10-08 DIAGNOSIS — N2581 Secondary hyperparathyroidism of renal origin: Secondary | ICD-10-CM | POA: Diagnosis not present

## 2021-10-08 DIAGNOSIS — G3184 Mild cognitive impairment, so stated: Secondary | ICD-10-CM | POA: Diagnosis not present

## 2021-10-08 DIAGNOSIS — Z992 Dependence on renal dialysis: Secondary | ICD-10-CM | POA: Diagnosis not present

## 2021-10-08 DIAGNOSIS — R2689 Other abnormalities of gait and mobility: Secondary | ICD-10-CM | POA: Diagnosis not present

## 2021-10-08 DIAGNOSIS — N186 End stage renal disease: Secondary | ICD-10-CM | POA: Diagnosis not present

## 2021-10-08 DIAGNOSIS — G4733 Obstructive sleep apnea (adult) (pediatric): Secondary | ICD-10-CM | POA: Diagnosis not present

## 2021-10-08 DIAGNOSIS — G5603 Carpal tunnel syndrome, bilateral upper limbs: Secondary | ICD-10-CM | POA: Diagnosis not present

## 2021-10-09 LAB — HEMOCHROMATOSIS DNA-PCR(C282Y,H63D)

## 2021-10-10 DIAGNOSIS — N2581 Secondary hyperparathyroidism of renal origin: Secondary | ICD-10-CM | POA: Diagnosis not present

## 2021-10-10 DIAGNOSIS — N186 End stage renal disease: Secondary | ICD-10-CM | POA: Diagnosis not present

## 2021-10-10 DIAGNOSIS — Z992 Dependence on renal dialysis: Secondary | ICD-10-CM | POA: Diagnosis not present

## 2021-10-13 DIAGNOSIS — Z992 Dependence on renal dialysis: Secondary | ICD-10-CM | POA: Diagnosis not present

## 2021-10-13 DIAGNOSIS — N2581 Secondary hyperparathyroidism of renal origin: Secondary | ICD-10-CM | POA: Diagnosis not present

## 2021-10-13 DIAGNOSIS — N186 End stage renal disease: Secondary | ICD-10-CM | POA: Diagnosis not present

## 2021-10-15 DIAGNOSIS — Z992 Dependence on renal dialysis: Secondary | ICD-10-CM | POA: Diagnosis not present

## 2021-10-15 DIAGNOSIS — N2581 Secondary hyperparathyroidism of renal origin: Secondary | ICD-10-CM | POA: Diagnosis not present

## 2021-10-15 DIAGNOSIS — N186 End stage renal disease: Secondary | ICD-10-CM | POA: Diagnosis not present

## 2021-10-16 ENCOUNTER — Ambulatory Visit: Payer: Medicare HMO | Admitting: Licensed Clinical Social Worker

## 2021-10-16 DIAGNOSIS — R413 Other amnesia: Secondary | ICD-10-CM

## 2021-10-16 DIAGNOSIS — E1159 Type 2 diabetes mellitus with other circulatory complications: Secondary | ICD-10-CM

## 2021-10-16 DIAGNOSIS — F322 Major depressive disorder, single episode, severe without psychotic features: Secondary | ICD-10-CM

## 2021-10-16 DIAGNOSIS — I5032 Chronic diastolic (congestive) heart failure: Secondary | ICD-10-CM

## 2021-10-16 DIAGNOSIS — E782 Mixed hyperlipidemia: Secondary | ICD-10-CM

## 2021-10-16 DIAGNOSIS — M810 Age-related osteoporosis without current pathological fracture: Secondary | ICD-10-CM

## 2021-10-16 DIAGNOSIS — N186 End stage renal disease: Secondary | ICD-10-CM

## 2021-10-16 NOTE — Patient Instructions (Signed)
Visit Information  Thank you for taking time to visit with me today. Please don't hesitate to contact me if I can be of assistance to you before our next scheduled telephone appointment.  Following are the goals we discussed today:   Our next appointment is by telephone on 11/20/21 at 9:00 AM   Please call the care guide team at [phone] if you need to cancel or reschedule your appointment.   If you are experiencing a Mental Health or [place] or need someone to talk to, please call the [facility]: [phone]   Following is a copy of your full plan of care:  Care Plan : [place]  Updates made by Katha Cabal, LCSW since 10/16/2021 12:00 AM     Problem: Emotional Distress      Goal: Emotional Distress identified. Manage Depression. Manage anxiety issues   Start Date: 09/11/2021  Expected End Date: 12/11/2021  This Visit's Progress: Not on track  Recent Progress: Not on track  Priority: Medium  Note:   Current Barriers:   Mobility issues Financial challenges Sleeping issues Decreased appetite sometimes Suicidal Ideation/Homicidal Ideation: No  Clinical Social Work Goal(s):  patient will work with SW  by telephone or in person to reduce or manage symptoms related to depression and anxiety Patinet will attend scheduled medical appointments in next 30 days Patient will communicate with RNCM as needed in next 30 days for nursing support  Interventions: 1:1 collaboration with Fayrene Helper, MD regarding development and update of comprehensive plan of care as evidenced by provider attestation and co-signature Discussed client needs with Waldon Reining Reviewed mobility needs of client. Reviewed sleeping issues of client. Reviewed pain issues of client Client reported that she gets dizzy sometimes when walking. She said sometimes when she walks that her knees give way or her legs feel weak. She has fallen recently but did not  report any serious injuries. She uses a cane to help her walk. She asked about a Teaching laboratory technician.  She said she wanted to talk with Dr. Moshe Cipro about Rollator Gilford Rile. Client and LCSW spoke of handrails for her bathroom as safety measure. LCSW gave client the name and phone number for [facility] [phone].). Client wrote down number for [facility].  LCSW talked with client about work of that group and that client could call number provided to request that agency mail her an application for help from [facility]. She plans to call [facility] agency Discussed family support. She has support from her spouse and from her daughter Reviewed appetite of client. She said sometimes she has reduced appetite. Reviewed medication procurement Provided counseling support for client Reviewed financial issues. She said she has a bill for medications that she will pay a little each month on medication bill Reviewed hobbies: she likes to watch TV, likes to read, likes to play games Encouraged client to call [facility] as needed for LCSW support or RN support Discussed transport needs. She said her family helps transport her to and from dialysis appointments each week  Patient  Coping Strengths:  Attends scheduled medical appointments Attends Dialysis treatments weekly as scheduled. Takes medications as prescribed  Patient Self Care Deficits:  Mobility issues Depression issues Sleeping challenges  Patient Goals:  - spend time or talk with others at least 2 to 3 times per week - practice relaxation or meditation daily - keep a calendar with appointment dates  Follow Up Plan: LCSW  to call client on 11/20/21 at 9:00 AM     Ms. Musco was given information about Care Management services by the embedded care coordination team including:  Care Management services include personalized support from designated clinical staff supervised by her physician, including individualized plan of care and  coordination with other care providers 24/7 contact phone numbers for assistance for urgent and routine care needs. The patient may stop CCM services at any time (effective at the end of the month) by phone call to the office staff.  Patient agreed to services and verbal consent obtained.   Norva Riffle.Lizeth Bencosme MSW, [place] representative [facility] Care Management [phone]

## 2021-10-16 NOTE — Chronic Care Management (AMB) (Signed)
Care Management Clinical Social Work Note  10/16/2021 Name: Kathryn Beck MRN: [id number] DOB: Apr 24, 1955  Kathryn Beck is a 65 y.o. year old female who is a primary care patient of Fayrene Helper, MD.  The Care Management team was consulted for assistance with chronic disease management and coordination needs.  Engaged with patient by telephone for follow up visit in response to provider referral for social work chronic care management and care coordination services  Consent to Services:  Kathryn Beck was given information about Care Management services today including:  Care Management services includes personalized support from designated clinical staff supervised by her physician, including individualized plan of care and coordination with other care providers 24/7 contact phone numbers for assistance for urgent and routine care needs. The patient may stop case management services at any time by phone call to the office staff.  Patient agreed to services and consent obtained.   Assessment: Review of patient past medical history, allergies, medications, and health status, including review of relevant consultants reports was performed today as part of a comprehensive evaluation and provision of chronic care management and care coordination services.  SDOH (Social Determinants of Health) assessments and interventions performed:  SDOH Interventions    Flowsheet Row Most Recent Value  SDOH Interventions   Physical Activity Interventions Other (Comments)  [walking challenges. she uses a cane to help her walk]  Stress Interventions Provide Counseling  [client has stress related to mobility issues. she has stress related to financial issues]  Depression Interventions/Treatment  Counseling, Medication        Advanced Directives Status: See Vynca application for related entries.  Care Plan  Allergies  Allergen Reactions   Ace Inhibitors Anaphylaxis and Swelling   Penicillins  Itching, Swelling and Other (See Comments)    Did it involve swelling of the face/tongue/throat, SOB, or low BP? Unknown Did it involve sudden or severe rash/hives, skin peeling, or any reaction on the inside of your mouth or nose? Unknown Did you need to seek medical attention at a hospital or doctor's office? Unknown When did it last happen?      years  If all above answers are "NO", may proceed with cephalosporin use.    Statins Other (See Comments)    elevated LFT's     Albuterol Swelling    Outpatient Encounter Medications as of 10/16/2021  Medication Sig   amLODipine (NORVASC) 5 MG tablet Take 1 tablet by mouth twice daily.**DO NOT TAKE AM DOSE ON DIALYSIS DAYS**   aspirin 81 MG chewable tablet CHEW 2 TABLETS BY MOUTH ONCE DAILY   B Complex-C-Folic Acid (RENA-VITE RX) 1 MG TABS Take 1 tablet by mouth daily.   benzonatate (TESSALON) 100 MG capsule Take 1 capsule (100 mg total) by mouth 2 (two) times daily as needed for cough.   calcitRIOL (ROCALTROL) 0.25 MCG capsule Take 0.25 mcg by mouth daily.   cloNIDine (CATAPRES) 0.2 MG tablet Take 0.2 mg by mouth 2 (two) times daily.   cyclobenzaprine (FLEXERIL) 5 MG tablet TAKE ONE TABLET BY MOUTH AT BEDTIME AS NEEDED FOR MUSCLE SPASMS.   DULoxetine (CYMBALTA) 60 MG capsule TAKE (1) CAPSULE BY MOUTH ONCE DAILY. (Patient taking differently: Take 60 mg by mouth daily.)   ezetimibe (ZETIA) 10 MG tablet TAKE 1 TABLET BY MOUTH ONCE DAILY. (Patient taking differently: Take 10 mg by mouth daily.)   fluticasone (FLONASE) 50 MCG/ACT nasal spray Place 2 sprays into both nostrils daily.   furosemide (LASIX) 80 MG tablet Take  80 mg by mouth 2 (two) times daily.   HYDROcodone-acetaminophen (NORCO/VICODIN) 5-325 MG tablet Take 1 tablet by mouth every 6 (six) hours as needed for moderate pain.   insulin aspart (FIASP FLEXTOUCH) 100 UNIT/ML FlexTouch Pen Inject 18-30 Units into the skin with breakfast, with lunch, and with evening meal.   insulin glargine, 2  Unit Dial, (TOUJEO MAX SOLOSTAR) 300 UNIT/ML Solostar Pen Inject 60 Units into the skin daily.   Insulin Pen Needle 32G X 4 MM MISC Use 4x a day   isosorbide mononitrate (IMDUR) 30 MG 24 hr tablet Take 1 tablet (30 mg total) by mouth daily.   Melatonin 3 MG TBDP Take one tablet by mouth at bedtime , for sleep (Patient taking differently: Take 3 mg by mouth at bedtime as needed (Sleep).)   metoprolol succinate (TOPROL-XL) 50 MG 24 hr tablet TAKE (1) TABLET BY MOUTH DAILY WITH FOOD *TAKE AFTER DIALYSIS*   midodrine (PROAMATINE) 10 MG tablet TAKE 1 TABLET BY MOUTH TWICE DAILY   ofloxacin (FLOXIN) 0.3 % OTIC solution    ondansetron (ZOFRAN) 4 MG tablet Take 1 tablet (4 mg total) by mouth every 8 (eight) hours as needed for nausea or vomiting.   pregabalin (LYRICA) 50 MG capsule TAKE (1) CAPSULE BY MOUTH THREE TIMES DAILY (Patient taking differently: Take 50 mg by mouth 3 (three) times daily.)   sertraline (ZOLOFT) 100 MG tablet Take 1 tablet (100 mg total) by mouth daily.   sevelamer carbonate (RENVELA) 800 MG tablet Take 800 mg by mouth 3 (three) times daily with meals.   sulfamethoxazole-trimethoprim (BACTRIM DS) 800-160 MG tablet Take 1 tablet by mouth 2 (two) times daily.   triamcinolone cream (KENALOG) 0.1 % APPLY TO AFFECTED AREA TWICE A DAY   [name] 500 MG chewable tablet Chew 500 mg by mouth See admin instructions. Take 500 mg with each snack and meal   zolpidem (AMBIEN) 5 MG tablet TAKE 1 TABLET BY MOUTH AT BEDTIME AS NEEDED.   [DISCONTINUED] FLUoxetine (PROZAC) 10 MG capsule Take 10 mg by mouth daily.     [DISCONTINUED] glipiZIDE (GLUCOTROL) 10 MG tablet Take 10 mg by mouth 2 (two) times daily before a meal.     Facility-Administered Encounter Medications as of 10/16/2021  Medication   0.9 %  sodium chloride infusion    Patient Active Problem List   Diagnosis Date Noted   Acute right otitis media 09/18/2021   Left ear impacted cerumen 09/18/2021   Sore throat 06/10/2021   Chronic  left shoulder pain 06/10/2021   Abnormal CXR 03/10/2021   Ear pain, bilateral 03/10/2021   Morbid obesity ([place]) 03/10/2021   Subungual hematoma of second toe of left foot 10/28/2020   Insomnia 10/28/2020   Memory loss 09/26/2020   Forgetfulness 09/26/2020   Recurrent vertigo 09/26/2020   Abnormal MRI, cervical spine 03/04/2020   Right leg weakness 02/28/2020   Knee pain, right 10/23/2019   Cough 08/09/2019   Carpal tunnel syndrome 06/13/2019   Chronic pain syndrome 06/13/2019   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[place]) 06/13/2019   Neck pain 03/23/2019   Stage 5 chronic kidney disease on chronic dialysis ([place]) 12/08/2018   Near syncope 12/05/2018   ESRD on hemodialysis ([place]) 11/22/2018   Ischemic heart disease 09/25/2018   Renal disease 08/22/2018   Not currently working due to disabled status 06/13/2018   ESRD on dialysis ([place]) 06/12/2018   Hypertension associated with diabetes ([place]) 06/12/2018   Depression, major, single episode, severe ([place]) 03/09/2018  Adrenal mass, left ([place]) 11/09/2017   MGUS (monoclonal gammopathy of unknown significance) 11/05/2017   Shoulder pain 11/01/2017   Chronic diastolic (congestive) heart failure ([place]) 09/20/2017   Bilateral leg weakness 09/09/2017   Adrenal adenoma 09/03/2017   Uncontrolled type 2 diabetes mellitus 08/18/2017   Unsteady gait 07/11/2017   Recurrent falls 07/11/2017   Anemia of chronic disease 03/24/2017   Personal history of colonic polyps 02/10/2017   Dysphagia 11/05/2016   Irritable bowel syndrome 02/04/2016   Intolerant of heat 10/07/2014   Cardiac murmur 01/17/2014   Back pain with left-sided radiculopathy 09/18/2013   Seasonal allergies 03/10/2013   Lipoma of back 12/22/2012   Other seasonal allergic rhinitis 08/22/2012   Sleep apnea 06/02/2011   Chronic kidney disease, stage 4 (severe) ([place]) 01/13/2011   Neuropathy 04/16/2010   Malaise and fatigue 07/24/2009   Personal history of other diseases  of the nervous system and sense organs 05/08/2009   LUPUS ERYTHEMATOSUS, DISCOID 06/05/2008   Arm pain 05/30/2008   Hyperlipidemia 06/07/2007   Obesity 06/07/2007   Malignant hypertension 06/07/2007   Type 2 diabetes mellitus with hyperglycemia ([place]) 06/07/2007   Diabetes mellitus ([place]) 06/07/2007    Conditions to be addressed/monitored: monitor client management of depression issues. Monitor client management of anxiety issues  Care Plan : LCSW Care Plan  Updates made by Katha Cabal, LCSW since 10/16/2021 12:00 AM     Problem: Emotional Distress      Goal: Emotional Distress identified. Manage Depression. Manage anxiety issues   Start Date: 09/11/2021  Expected End Date: 12/11/2021  This Visit's Progress: Not on track  Recent Progress: Not on track  Priority: Medium  Note:   Current Barriers:   Mobility issues Financial challenges Sleeping issues Decreased appetite sometimes Suicidal Ideation/Homicidal Ideation: No  Clinical Social Work Goal(s):  patient will work with SW  by telephone or in person to reduce or manage symptoms related to depression and anxiety Pat[place] will attend scheduled medical appointments in next 30 days Patient will communicate with RNCM as needed in next 30 days for nursing support  Interventions: 1:1 collaboration with Fayrene Helper, MD regarding development and update of comprehensive plan of care as evidenced by provider attestation and co-signature Discussed client needs with Kathryn Beck Reviewed mobility needs of client. Reviewed sleeping issues of client. Reviewed pain issues of client Client reported that she gets dizzy sometimes when walking. She said sometimes when she walks that her knees give way or her legs feel weak. She has fallen recently but did not report any serious injuries. She uses a cane to help her walk. She asked about a Teaching laboratory technician.  She said she wanted to talk with Dr. Moshe Cipro about Rollator  Gilford Rile. Client and LCSW spoke of handrails for her bathroom as safety measure. LCSW gave client the name and phone number for [facility] [phone].). Client wrote down number for [facility].  LCSW talked with client about work of that group and that client could call number provided to request that agency mail her an application for help from [facility]. She plans to call [facility] agency Discussed family support. She has support from her spouse and from her daughter Reviewed appetite of client. She said sometimes she has reduced appetite. Reviewed medication procurement Provided counseling support for client Reviewed financial issues. She said she has a bill for medications that she will pay a little each month on medication bill Reviewed hobbies: she likes to watch  TV, likes to read, likes to play games Encouraged client to call [facility] as needed for LCSW support or RN support Discussed transport needs. She said her family helps transport her to and from dialysis appointments each week  Patient  Coping Strengths:  Attends scheduled medical appointments Attends Dialysis treatments weekly as scheduled. Takes medications as prescribed  Patient Self Care Deficits:  Mobility issues Depression issues Sleeping challenges  Patient Goals:  - spend time or talk with others at least 2 to 3 times per week - practice relaxation or meditation daily - keep a calendar with appointment dates  Follow Up Plan: LCSW to call client on 11/20/21 at 9:00 AM      Norva Riffle.Jacora Hopkins MSW, [place] representative [facility] Care Management [phone]

## 2021-10-17 ENCOUNTER — Other Ambulatory Visit: Payer: Self-pay | Admitting: Family Medicine

## 2021-10-17 DIAGNOSIS — Z992 Dependence on renal dialysis: Secondary | ICD-10-CM | POA: Diagnosis not present

## 2021-10-17 DIAGNOSIS — N2581 Secondary hyperparathyroidism of renal origin: Secondary | ICD-10-CM | POA: Diagnosis not present

## 2021-10-17 DIAGNOSIS — N186 End stage renal disease: Secondary | ICD-10-CM | POA: Diagnosis not present

## 2021-10-20 DIAGNOSIS — Z992 Dependence on renal dialysis: Secondary | ICD-10-CM | POA: Diagnosis not present

## 2021-10-20 DIAGNOSIS — N2581 Secondary hyperparathyroidism of renal origin: Secondary | ICD-10-CM | POA: Diagnosis not present

## 2021-10-20 DIAGNOSIS — N186 End stage renal disease: Secondary | ICD-10-CM | POA: Diagnosis not present

## 2021-10-22 DIAGNOSIS — Z992 Dependence on renal dialysis: Secondary | ICD-10-CM | POA: Diagnosis not present

## 2021-10-22 DIAGNOSIS — N186 End stage renal disease: Secondary | ICD-10-CM | POA: Diagnosis not present

## 2021-10-22 DIAGNOSIS — N2581 Secondary hyperparathyroidism of renal origin: Secondary | ICD-10-CM | POA: Diagnosis not present

## 2021-10-24 DIAGNOSIS — N2581 Secondary hyperparathyroidism of renal origin: Secondary | ICD-10-CM | POA: Diagnosis not present

## 2021-10-24 DIAGNOSIS — N186 End stage renal disease: Secondary | ICD-10-CM | POA: Diagnosis not present

## 2021-10-24 DIAGNOSIS — Z992 Dependence on renal dialysis: Secondary | ICD-10-CM | POA: Diagnosis not present

## 2021-10-27 DIAGNOSIS — N186 End stage renal disease: Secondary | ICD-10-CM | POA: Diagnosis not present

## 2021-10-27 DIAGNOSIS — Z992 Dependence on renal dialysis: Secondary | ICD-10-CM | POA: Diagnosis not present

## 2021-10-27 DIAGNOSIS — N2581 Secondary hyperparathyroidism of renal origin: Secondary | ICD-10-CM | POA: Diagnosis not present

## 2021-10-28 ENCOUNTER — Encounter: Payer: Self-pay | Admitting: Internal Medicine

## 2021-10-28 ENCOUNTER — Ambulatory Visit (INDEPENDENT_AMBULATORY_CARE_PROVIDER_SITE_OTHER): Payer: Medicare HMO | Admitting: Internal Medicine

## 2021-10-28 VITALS — BP 124/72 | HR 96 | Ht 64.0 in | Wt 193.0 lb

## 2021-10-28 DIAGNOSIS — E782 Mixed hyperlipidemia: Secondary | ICD-10-CM | POA: Diagnosis not present

## 2021-10-28 DIAGNOSIS — R7989 Other specified abnormal findings of blood chemistry: Secondary | ICD-10-CM | POA: Diagnosis not present

## 2021-10-28 DIAGNOSIS — D3500 Benign neoplasm of unspecified adrenal gland: Secondary | ICD-10-CM | POA: Diagnosis not present

## 2021-10-28 DIAGNOSIS — E1122 Type 2 diabetes mellitus with diabetic chronic kidney disease: Secondary | ICD-10-CM

## 2021-10-28 DIAGNOSIS — N186 End stage renal disease: Secondary | ICD-10-CM

## 2021-10-28 DIAGNOSIS — D352 Benign neoplasm of pituitary gland: Secondary | ICD-10-CM

## 2021-10-28 LAB — POCT GLYCOSYLATED HEMOGLOBIN (HGB A1C): Hemoglobin A1C: 8.9 % — AB (ref 4.0–5.6)

## 2021-10-28 LAB — TSH: TSH: 0.91 u[IU]/mL (ref 0.35–5.50)

## 2021-10-28 LAB — T3, FREE: T3, Free: 2.1 pg/mL — ABNORMAL LOW (ref 2.3–4.2)

## 2021-10-28 LAB — T4, FREE: Free T4: 0.93 ng/dL (ref 0.60–1.60)

## 2021-10-28 LAB — POTASSIUM: Potassium: 3.7 mEq/L (ref 3.5–5.1)

## 2021-10-28 MED ORDER — TOUJEO MAX SOLOSTAR 300 UNIT/ML ~~LOC~~ SOPN
80.0000 [IU] | PEN_INJECTOR | Freq: Every day | SUBCUTANEOUS | 3 refills | Status: DC
Start: 2021-10-28 — End: 2022-05-25

## 2021-10-28 MED ORDER — FIASP FLEXTOUCH 100 UNIT/ML ~~LOC~~ SOPN: 18.0000 [IU] | PEN_INJECTOR | Freq: Three times a day (TID) | SUBCUTANEOUS | 3 refills | Status: DC

## 2021-10-28 NOTE — Progress Notes (Addendum)
Patient ID: Kathryn Beck, female   DOB: 17-Mar-1956, 66 y.o.   MRN: [id number]  HPI: Kathryn Beck is a 66 y.o.-year-old female, initially referred by her PCP, Dr. Moshe Cipro, returning for follow-up for of DM2, dx in 2008, insulin-dependent since ~2015, uncontrolled, with complications (ESRD, PN). She saw Dr. Dorris Fetch - last OV with him 05/2016.  Last visit with me 3 months ago. She is here with her daughter who offers part of the history especially related to medication doses, blood sugars, and other medical history.  Interim history: She was previously on peritoneal dialysis but before last visit changed to hemodialysis.  She was previously on the kidney transplant but not anymore as she needs better control of hypertension and diabetes. She denies increased urination, blurry vision, nausea, chest pain.   Adrenal masses.  Reviewed previous imaging reports: 04/22/2018: MRI abd.: Stable 19 mm nodule in the left adrenal gland, unchanged since 2015, 19 mm otherwise negative.  05/28/2017: MRI abd: Bilateral adrenal nodules with signal dropout on out of phase imaging, consistent with adenomas.   Example at 2.5 cm on the left and on the order of 1.6 cm on the right.  10/10/2015: CT abdomen and pelvis without contrast: Stable 2.2 cm left adrenal nodule with Hounsfield unit measurements 28 unchanged likely a lipid poor adenoma  08/23/2008: MRI of the abdomen with and without contrast: Left adrenal mass measuring 1.6 x 1.5 cm (previously measuring 1.4 x 1.6 cm on the CT scan from 2003 -stable in size),  Of note, she has an enlarging pancreatic head mass - pt reports a h/o pancreatitis. (This will need to be eval. By GI)  Reviewed previous adrenal biochemical investigation: Dexamethasone suppression test showed a low normal, nonsuppressed cortisol, but I suspect that this is due to her end-stage renal disease and dialysis status, rather than increased production. Component     Latest Ref Rng & Units  08/31/2018  Cortisol - AM     mcg/dL 6.1  Dexamethasone, Serum     ng/dL 446   A 24-hour urine cortisol could not be done - on dialysis.  We checked a late-night salivary cortisol as this was elevated: Component     Latest Ref Rng & Units 12/26/2020 Draw date/time: 12/22/20  midnight   Salivary Cortisol Baseline     ug/dL 0.637  Salivary Cortisol 2nd Specimen     ug/dL 0.634  Reference Range:  Children and Adults:  8:00a.m.:   0.025 - 0.600  Noon:      <0.010 - 0.330  4:00p.m.:   0.010 - 0.200  Midnight:  <0.010 - 0.090   Previous investigation was negative for hyperaldosteronism or pheochromocytoma: Component     Latest Ref Rng & Units 11/30/2017 10/04/2018 11/21/2020  Epinephrine     pg/mL Undetectable 24 <20  Norepinephrine     pg/mL 99 167 (L) 332  Dopamine     pg/mL Undetectable <10 <10  Catecholamines, Total     pg/mL 99    Total Catecholamines     pg/mL  191 (L) 332  Metanephrine, Pl     <=57 pg/mL <25 <25 <25  Normetanephrine, Pl     <=148 pg/mL 71 38 56  Total Metanephrines-Plasma     <=205 pg/mL 71 38 56  ALDOSTERONE      ng/dL 3 2 <1  Renin Activity     0.25 - 5.82 ng/mL/h 1.20 1.48 0.28  ALDO / PRA Ratio      2.5 1.4 see note  Potassium     3.5 - 5.1 mEq/L 4.0 4.9 4.1   Since last visit, we tried to recheck her salivary cortisol but this was canceled by the lab.  After this, we rechecked a cortisol and ACTH and they returned elevated, with a normal DHEA-S:  Component     Latest Ref Rng 07/22/2021  Salivary Cortisol Baseline     ug/dL CANCELED    Cortisol, Plasma ug/dL 24.7   Comment: AM:  4.3 - 22.4 ug/dLPM:  3.1 - 16.7 ug/dL  Resulting Agency  [facility]     C206 ACTH 6 - 50 pg/mL 85 High    Comment: .  Reference range applies only to specimens collected  between 7am-10am.  .   Resulting Agency  [facility]   DHEA-Sulfate, LCMS ug/dL 91   Comment: This test was developed and its performance characteristics   determined by [facility]. It has not been cleared or approved  by the Food and Drug Administration.  Reference Range:  Adult Females (61 - 70y): <128   Resulting Agency  [facility]   At that point, I referred the patient for a pituitary MRI:  Pituitary MRI (10/02/2021): showed a microadenoma: Brain: Limited by motion degradation which is pervasive. Small pituitary gland with partially empty sella. There is a hypoenhancing nodule in the right aspect of the pituitary/sella measuring up to 3 mm on coronal postcontrast imaging, see series 26. Increased thickness along the right dorsum sella is ossification by 2019 CT. No infundibular or cavernous sinus disease.   IMPRESSION: 1. 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 Significant motion artifact. 2. Mild chronic small vessel disease.  DM2: Reviewed HbA1c levels: Lab Results  Component Value Date   HGBA1C 9.1 (A) 07/22/2021   HGBA1C 11.4 (A) 05/02/2021   HGBA1C 9.1 (A) 10/28/2020  11/01/2020: HbA1c 9.3% 02/09/2020: HbA1c 6.8%  In the past, pt. was on a regimen of: - Humalog 75/25 20-26 units before each meal, 2-3X a day depending on the CBG before the meal  - Levemir 100 units at bedtime  At last visit she was on: - Toujeo 54 >>  was only taking 34 units in the evening -but out for 2 months - Humalog >> on NovoLog 70/30 24-30 units 3-4x a day (!!!????) 24 units before a smaller meal 26-28 units before a regular meal 30 units before a larger meal Take Humalog EVERY TIME you eat, 15 min before the meal, if sugars are >70. - Humalog sliding scale: - 150-175: + 1 unit  - 176-200: + 2 units  - 201-225: + 3 units  - 226-250: + 4 units  - 251-275: + 5 units - 276-300: + 6 units  Try not to correct sugars at bedtime unless they are >300 and in that case, take no more than 4 units.  At last visit, she was off her insulin for at least a month.  We restarted: - Toujeo 40 >> 60 units in the evening  - Fiasp: 20 units before a  smaller meal 24 units before a regular meal 28 units before a larger meal (mostly using this) Take FiAsp EVERY TIME you eat, 15 min before the meal, if sugars are >70.  Pt was checking her sugars 2x a day: - am: 240-300s >> 186, 340 >> 40, 186-HI - 2h after b'fast: n/c  - lunch: 240s >> n/c - 2h after lunch: n/c >> 519 - dinner: 200-300s >> 486 >> 216, 469 - 2h after dinner: n/c >>  385  - bedtime: 300-HI >> 135, 265, 560, 595 >> n/c - nighttime: 197, 224 Lowest sugar was 31 ... >> 42 >> 135 >> 40; it is unclear at which level she has hypoglycemia awareness. Highest sugar was HI >> 500s >> 519, HI.  Glucometer: Accuchek  Pt's meals are: - Breakfast: eggs, bacon, oatmeal, grits - Lunch: salads - Dinner: meat + veggie or sandwich + salad or nabs - Snacks: nabs, fruit cups, jello  -+ ESRD, on dialysis: Lab Results  Component Value Date   BUN 32 (H) 10/02/2021   BUN 24 (H) 09/05/2021   CREATININE 7.08 (H) 10/02/2021   CREATININE 4.38 (H) 09/05/2021  Off olmesartan.  On irbesartan now.  -+ HL; last set of lipids: Lab Results  Component Value Date   CHOL 190 11/21/2020   HDL 43.10 11/21/2020   LDLCALC 114 (H) 03/08/2018   LDLDIRECT 120.0 11/21/2020   TRIG 259 (H) 12/02/2020   CHOLHDL 4 11/21/2020  Previously on Lipitor 40, now on Zetia due to transaminitis with statins.  - last eye exam was in 07/2021: no DR  -No numbness but she has tingling in her feet.  She is on Neurontin 300 mg 3 times a day.  Last foot exam 02/2021.  She also has HTN. She has been in the hospital repeatedly in the past for pancreatitis. She also has a history of MGUS, increased ferritin, osteoporosis.  ROS: + See HPI  I reviewed pt's medications, allergies, PMH, social hx, family hx, and changes were documented in the history of present illness. Otherwise, unchanged from my initial visit note.  Past Medical History:  Diagnosis Date   Acid reflux    Anemia    Arthritis    Axillary masses     Soft tissue - status post excision   Back pain    COVID-19 virus infection 04/06/2019   Depression    End-stage renal disease (HCC)    TTHSat  in [place] hypertension    History of blood transfusion    History of cardiac catheterization    Normal coronary arteries October 2020   History of claustrophobia    History of pneumonia 2019   Hypoxia 04/03/2019   Mixed hyperlipidemia    Obesity    Pancreatitis    Peritoneal dialysis catheter in place [facility])    Pneumonia due to COVID-19 virus 04/02/2019   Sleep apnea    Noncompliant with CPAP   Type 2 diabetes mellitus ([place])    Past Surgical History:  Procedure Laterality Date   ABDOMINAL HYSTERECTOMY     AV FISTULA PLACEMENT Left 09/02/2017   Procedure: creation of left arm ARTERIOVENOUS (AV) FISTULA;  Surgeon: Serafina Mitchell, MD;  Location: [place];  Service: Vascular;  Laterality: Left;   COLONOSCOPY  2008   Dr. Oneida Alar: normal    COLONOSCOPY N/A 12/18/2016   Dr. Oneida Alar: multiple tubular adenomas, internal hemorrhoids. Surveillance in 3 years    ESOPHAGEAL DILATION N/A 10/13/2015   Procedure: ESOPHAGEAL DILATION;  Surgeon: Rogene [place], MD;  [facility];  Service: Endoscopy;  Laterality: N/A;   ESOPHAGOGASTRODUODENOSCOPY N/A 10/13/2015   Dr. Laural Golden: chronic gastritis on path, no H.pylori. Empiric dilation    ESOPHAGOGASTRODUODENOSCOPY N/A 12/18/2016   Dr. Oneida Alar: mild gastritis. BRAVO study revealed uncontrolled GERD. Dysphagia secondary to uncontrolled reflux   FOOT SURGERY Bilateral    "nerve"     LEFT HEART CATH AND CORONARY ANGIOGRAPHY N/A 12/29/2018   Procedure: LEFT HEART CATH AND  CORONARY ANGIOGRAPHY;  Surgeon: Jettie Booze, MD;  Location: [place] [facility];  Service: Cardiovascular;  Laterality: N/A;   LUNG BIOPSY     MASS EXCISION Right 01/09/2013   Procedure: EXCISION OF NEOPLASM OF RIGHT  AXILLA  AND EXCISION OF NEOPLASM OF LEFT AXILLA;  Surgeon: Jamesetta So, MD;  Location: [facility];   Service: General;  Laterality: Right;  procedure end @ 08:23   MYRINGOTOMY WITH TUBE PLACEMENT Bilateral 04/28/2017   Procedure: BILATERAL MYRINGOTOMY WITH TUBE PLACEMENT;  Surgeon: Leta Baptist, MD;  Location: [place];  Service: ENT;  Laterality: Bilateral;   REVISION OF ARTERIOVENOUS GORETEX GRAFT Left 05/04/2018   Procedure: TRANSPOSITION OF CEPHALIC VEIN ARTERIOVENOUS FISTULA LEFT ARM;  Surgeon: Rosetta Posner, MD;  Location: [place];  Service: Vascular;  Laterality: Left;   SAVORY DILATION N/A 12/18/2016   Procedure: SAVORY DILATION;  Surgeon: Danie Binder, MD;  [facility];  Service: Endoscopy;  Laterality: N/A;   Social History   Socioeconomic History   Marital status: Married    Spouse name: Not on file   Number of children: 2  Occupational History   CNA  Tobacco Use   Smoking status: Never Smoker   Smokeless tobacco: Never Used  Substance and Sexual Activity   Alcohol use: No   Drug use: No   Current Outpatient Medications on File Prior to Visit  Medication Sig Dispense Refill   amLODipine (NORVASC) 5 MG tablet Take 1 tablet by mouth twice daily.**DO NOT TAKE AM DOSE ON DIALYSIS DAYS** 56 tablet 11   aspirin 81 MG chewable tablet CHEW 2 TABLETS BY MOUTH ONCE DAILY 56 tablet 11   B Complex-C-Folic Acid (RENA-VITE RX) 1 MG TABS Take 1 tablet by mouth daily.     benzonatate (TESSALON) 100 MG capsule Take 1 capsule (100 mg total) by mouth 2 (two) times daily as needed for cough. 20 capsule 0   calcitRIOL (ROCALTROL) 0.25 MCG capsule Take 0.25 mcg by mouth daily.     cloNIDine (CATAPRES) 0.2 MG tablet Take 0.2 mg by mouth 2 (two) times daily.     cyclobenzaprine (FLEXERIL) 5 MG tablet TAKE ONE TABLET BY MOUTH AT BEDTIME AS NEEDED FOR MUSCLE SPASMS. 20 tablet 0   DULoxetine (CYMBALTA) 60 MG capsule TAKE (1) CAPSULE BY MOUTH ONCE DAILY. (Patient taking differently: Take 60 mg by mouth daily.) 28 capsule 11   ezetimibe (ZETIA) 10 MG tablet TAKE 1 TABLET BY MOUTH ONCE DAILY.  (Patient taking differently: Take 10 mg by mouth daily.) 28 tablet 11   fluticasone (FLONASE) 50 MCG/ACT nasal spray Place 2 sprays into both nostrils daily. 16 g 6   furosemide (LASIX) 80 MG tablet Take 80 mg by mouth 2 (two) times daily.     HYDROcodone-acetaminophen (NORCO/VICODIN) 5-325 MG tablet Take 1 tablet by mouth every 6 (six) hours as needed for moderate pain. 16 tablet 0   insulin aspart (FIASP FLEXTOUCH) 100 UNIT/ML FlexTouch Pen Inject 18-30 Units into the skin with breakfast, with lunch, and with evening meal. 45 mL 2   insulin glargine, 2 Unit Dial, (TOUJEO MAX SOLOSTAR) 300 UNIT/ML Solostar Pen Inject 60 Units into the skin daily. 18 mL 3   Insulin Pen Needle 32G X 4 MM MISC Use 4x a day 300 each 3   isosorbide mononitrate (IMDUR) 30 MG 24 hr tablet Take 1 tablet (30 mg total) by mouth daily. 28 tablet 11   Melatonin 3 MG TBDP Take one tablet by mouth at  bedtime , for sleep (Patient taking differently: Take 3 mg by mouth at bedtime as needed (Sleep).) 30 tablet 2   metoprolol succinate (TOPROL-XL) 50 MG 24 hr tablet TAKE (1) TABLET BY MOUTH DAILY WITH FOOD *TAKE AFTER DIALYSIS* 28 tablet 11   midodrine (PROAMATINE) 10 MG tablet TAKE 1 TABLET BY MOUTH TWICE DAILY 56 tablet 5   ofloxacin (FLOXIN) 0.3 % OTIC solution      ondansetron (ZOFRAN) 4 MG tablet Take 1 tablet (4 mg total) by mouth every 8 (eight) hours as needed for nausea or vomiting. 20 tablet 0   pregabalin (LYRICA) 50 MG capsule TAKE (1) CAPSULE BY MOUTH THREE TIMES DAILY (Patient taking differently: Take 50 mg by mouth 3 (three) times daily.) 84 capsule 5   sertraline (ZOLOFT) 100 MG tablet Take 1 tablet (100 mg total) by mouth daily. 90 tablet 2   sertraline (ZOLOFT) 50 MG tablet TAKE 1 TABLET BY MOUTH ONCE DAILY. 28 tablet 0   sevelamer carbonate (RENVELA) 800 MG tablet Take 800 mg by mouth 3 (three) times daily with meals.     sulfamethoxazole-trimethoprim (BACTRIM DS) 800-160 MG tablet Take 1 tablet by mouth 2 (two)  times daily. 20 tablet 0   triamcinolone cream (KENALOG) 0.1 % APPLY TO AFFECTED AREA TWICE A DAY 30 g 0   [name] 500 MG chewable tablet Chew 500 mg by mouth See admin instructions. Take 500 mg with each snack and meal     zolpidem (AMBIEN) 5 MG tablet TAKE 1 TABLET BY MOUTH AT BEDTIME AS NEEDED. 30 tablet 0   [DISCONTINUED] FLUoxetine (PROZAC) 10 MG capsule Take 10 mg by mouth daily.       [DISCONTINUED] glipiZIDE (GLUCOTROL) 10 MG tablet Take 10 mg by mouth 2 (two) times daily before a meal.       Current Facility-Administered Medications on File Prior to Visit  Medication Dose Route Frequency Provider Last Rate Last Admin   0.9 %  sodium chloride infusion   Intravenous Continuous Lockamy, Randi L, NP-C 20 mL/hr at 07/19/19 1641 Rate Verify at 07/19/19 1641   Allergies  Allergen Reactions   Ace Inhibitors Anaphylaxis and Swelling   Penicillins Itching, Swelling and Other (See Comments)    Did it involve swelling of the face/tongue/throat, SOB, or low BP? Unknown Did it involve sudden or severe rash/hives, skin peeling, or any reaction on the inside of your mouth or nose? Unknown Did you need to seek medical attention at a hospital or doctor's office? Unknown When did it last happen?      years  If all above answers are "NO", may proceed with cephalosporin use.    Statins Other (See Comments)    elevated LFT's     Albuterol Swelling   Family History  Problem Relation Age of Onset   Hypertension Father    Hypercholesterolemia Father    Arthritis Father    Hypertension Sister    Hypercholesterolemia Sister    Breast cancer Sister    Hypertension Sister    Colon cancer Neg Hx    Colon polyps Neg Hx    PE: BP 124/72 (BP Location: Right Arm, Patient Position: Sitting, Cuff Size: Large)   Pulse 96   Ht '5\' 4"'$  (1.626 m)   Wt 193 lb (87.5 kg)   SpO2 (!) 70%   BMI 33.13 kg/m  Wt Readings from Last 3 Encounters:  10/28/21 193 lb (87.5 kg)  10/02/21 190 lb 4.8 oz (86.3 kg)   09/18/21 191 lb 1.3  oz (86.7 kg)   Constitutional: overweight, in NAD, moon facies, truncal obesity Eyes: EOMI, no exophthalmos ENT: moist mucous membranes, no thyromegaly, no cervical lymphadenopathy Cardiovascular: tachycardia, RR, No RG, +1/6 SEM, + bilateral leg swelling, pitting Respiratory: CTA B Musculoskeletal: no deformities Skin: moist, warm, + dark macular rash and dry skin on her chest and neck Neurological: no tremor with outstretched hands  ASSESSMENT: 1. DM2, insulin-dependent, uncontrolled, with complications - CKD stage 4 - PN  2. HL  3. B adrenal adenomas  4.  Hypercortisolism  5. Pituitary microadenoma  PLAN:  1.  Very complex patient with longstanding, uncontrolled, type 2 diabetes, previously on a premixed insulin regimen, which we changed to basal-bolus insulin regimen earlier in the year.  Her HbA1c decreased from 11.4% to 9.1% at last visit.  At that time, sugars were still very high, with many values in the 300s and even 500s.  Unfortunately, she mentions that her insulins were not covered as she was using her husband's long-acting insulin but no short-acting insulin.  Also, she was off insulin for at least 1 month at last visit.  She had poor memory and cannot remember exactly.  She was down 7 pounds, most likely due to glucotoxicity.  We discussed about restarting insulin and I gave her short and long-acting insulin samples and I sent a prescription for Toujeo and Fiasp to her pharmacy.  We started Toujeo at a lower dose and I advised her to increase this as needed. -At today's visit, sugars remain very high.  She also has occasional low blood sugars, in the last 2 weeks, she had 1 glucose at 40 in the morning.  Otherwise, the rest of the sugars are mostly higher than 200s and up to HI.  Patient mentions that she is taking Toujeo and Fiasp at the prescribed doses, however, she mentions that she takes [place] after she checks the sugars after meals and they are  high.  I again strongly advised her to move the [place] before the meals, and even 10 minutes prior to the meal, since her sugars are still high.  Will also increase both Toujeo and Fiasp doses. - I suggested to:  Patient Instructions  Please increase: - Toujeo 80 units daily - Fiasp: up to 10 min before a meal: 28-34 units   Please stop at the lab.  Please return in 3 months.      - we checked her HbA1c: 8.9% (lower) - advised to check sugars at different times of the day - 4x a day, rotating check times - advised for yearly eye exams >> she is UTD - return to clinic in 3 months  2. HL -Reviewed latest lipid panel from 11/2020: LDL above target, triglycerides also high: Lab Results  Component Value Date   CHOL 190 11/21/2020   HDL 43.10 11/21/2020   LDLCALC 114 (H) 03/08/2018   LDLDIRECT 120.0 11/21/2020   TRIG 259 (H) 12/02/2020   CHOLHDL 4 11/21/2020  -She continues on Zetia 10 mg daily without side effects.  She previously had transaminitis.  3.  Bilateral adrenal adenoma -Patient with history of bilateral adrenal adenomas per review of her abdominal MRI from 2019.  Imaging characteristics pointed towards benign tumors, of 2.5 and 1.6 cm, respectively.  However, they have appeared to increase over time.  Reviewing the MRI of the abdomen from 2010 and the CT of the abdomen and pelvis from 2017, only 1 nodule was seen in both, in the left adrenal.  She had  another MRI in 03/2018 which showed a stable left 1.9 cm adrenal nodule. -We investigated her adrenal status.  Plasma catecholamines, metanephrines, and aldosterone  were normal between 2019-2022.  Norepinephrine was slightly elevated (nonspecific) 09/2018. -Investigation of her cortisol status is difficult due to being on dialysis.  These patients usually have higher cortisol level due to increased inflammation but not necessarily increased cortisol production.  However, repeated investigation for this returned positive (see  below)  4.  Hypercortisolism -Investigation for autonomous cortisol production was positive: Patient had a nonsuppressible cortisol level by dexamethasone suppression test, high salivary cortisol, and also an elevated serum cortisol along with an elevated ACTH level and normal DHEA-S. -These results pointed towards a central etiology for her hypercortisolism, therefore, we checked the pituitary MRI, which was positive for pituitary mass (see below)  5.  Pituitary microadenoma -After ACTH and cortisol returned elevated, we checked a pituitary MRI (10/02/2021).  This revealed a possible 3 mm pituitary mass.  -At today's visit we will check her pituitary hormones. -It is possible that this is the source of her cortisol production, however, she most likely will need petrosal sinus sampling before possible surgery-we discussed that we do not have the option of doing it here.  That is why, I would like to refer her to [facility]. -We discussed about the possible risk of hypopituitarism after the surgery, but also resolution of her hypercortisolism with most likely improvement in blood pressure, diabetes, weight, risk of osteoporosis and immunosuppression - she agrees with a referral to [facility].  - Total time spent for the visit: 40 minutes, in precharting, obtaining medical information from the chart, from the patient, and also from her daughter, reviewing her  previous labs, imaging evaluations, and treatments, reviewing her symptoms, counseling her about her conditions (please see the discussed topics above), and developing a plan to further investigate and treat them; she had a number of questions which I addressed.  Component     Latest Ref Rng 10/28/2021  Potassium     3.5 - 5.1 mEq/L 3.7   Hemoglobin A1C     4.0 - 5.6 % 8.9 !   TSH     0.35 - 5.50 uIU/mL 0.91   Metanephrine, Pl     <=57 pg/mL <25   Normetanephrine, Pl     <=148 pg/mL 74   Total Metanephrines-Plasma     <=205 pg/mL 74   Epinephrine      pg/mL 31   Norepinephrine     pg/mL 736   Dopamine     pg/mL <10   ALDOSTERONE CANCELED   Renin Activity     0.25 - 5.82 ng/mL/h 0.46   ALDO / PRA Ratio CANCELED   HbA1c, POC (controlled diabetic range)     0.0 - 7.0 %   Total Catecholamines     pg/mL 767   Triiodothyronine,Free,Serum     2.3 - 4.2 pg/mL 2.1 (L)   T4,Free(Direct)     0.60 - 1.60 ng/dL 0.93   Growth Hormone     < OR = 7.1 ng/mL 0.1   IGF-I, LC/MS     41 - 279 ng/mL 141   Z-Score (Female)     -2.0 - 2.0 SD 0.4   Prolactin     ng/mL 11.4     At this point, I will go ahead and refer her to [facility] pituitary center.  [name], MD PhD [facility] Endocrinology

## 2021-10-28 NOTE — Patient Instructions (Addendum)
Please increase: - Toujeo 80 units daily - Fiasp: up to 10 min before a meal: 28-34 units   Please stop at the lab.  Please return in 3 months.

## 2021-10-29 ENCOUNTER — Encounter: Payer: Self-pay | Admitting: Family Medicine

## 2021-10-29 DIAGNOSIS — Z992 Dependence on renal dialysis: Secondary | ICD-10-CM | POA: Diagnosis not present

## 2021-10-29 DIAGNOSIS — N2581 Secondary hyperparathyroidism of renal origin: Secondary | ICD-10-CM | POA: Diagnosis not present

## 2021-10-29 DIAGNOSIS — N186 End stage renal disease: Secondary | ICD-10-CM | POA: Diagnosis not present

## 2021-10-29 NOTE — Telephone Encounter (Signed)
Appt scheduled patient / daughter aware

## 2021-10-31 ENCOUNTER — Ambulatory Visit (INDEPENDENT_AMBULATORY_CARE_PROVIDER_SITE_OTHER): Payer: Medicare HMO | Admitting: Family Medicine

## 2021-10-31 ENCOUNTER — Encounter: Payer: Self-pay | Admitting: Family Medicine

## 2021-10-31 VITALS — BP 174/74 | HR 84 | Resp 16 | Ht 64.0 in | Wt 196.0 lb

## 2021-10-31 DIAGNOSIS — Z992 Dependence on renal dialysis: Secondary | ICD-10-CM | POA: Diagnosis not present

## 2021-10-31 DIAGNOSIS — N2581 Secondary hyperparathyroidism of renal origin: Secondary | ICD-10-CM | POA: Diagnosis not present

## 2021-10-31 DIAGNOSIS — I152 Hypertension secondary to endocrine disorders: Secondary | ICD-10-CM | POA: Diagnosis not present

## 2021-10-31 DIAGNOSIS — E1122 Type 2 diabetes mellitus with diabetic chronic kidney disease: Secondary | ICD-10-CM

## 2021-10-31 DIAGNOSIS — W19XXXA Unspecified fall, initial encounter: Secondary | ICD-10-CM

## 2021-10-31 DIAGNOSIS — Z794 Long term (current) use of insulin: Secondary | ICD-10-CM

## 2021-10-31 DIAGNOSIS — E1159 Type 2 diabetes mellitus with other circulatory complications: Secondary | ICD-10-CM | POA: Diagnosis not present

## 2021-10-31 DIAGNOSIS — M5442 Lumbago with sciatica, left side: Secondary | ICD-10-CM

## 2021-10-31 DIAGNOSIS — N186 End stage renal disease: Secondary | ICD-10-CM

## 2021-10-31 DIAGNOSIS — M79672 Pain in left foot: Secondary | ICD-10-CM | POA: Diagnosis not present

## 2021-10-31 MED ORDER — HYDROCODONE-ACETAMINOPHEN 5-325 MG PO TABS: ORAL_TABLET | ORAL | 0 refills | Status: DC

## 2021-10-31 NOTE — Patient Instructions (Signed)
F/U as before, call if you need me sooner  Xrays today please  Pain medication prescribed is only to be taken one tablet,  once daily at bedtime , if needed  Be careful not to fall again!  Thanks for choosing [facility], we consider it a privelige to serve you.

## 2021-10-31 NOTE — Progress Notes (Unsigned)
Kathryn Beck     MRN: [id number]      DOB: 1956-01-18   HPI Kathryn Beck is here slipped and fell on wet grass at her home this past Monday, on October 27, 2021, 4 days ago, constant tail bone pain, no comfortable position pain is 10, lso c/o swelling and discoloration of left foot felt her toe bend backwards when she fell stil weight bearing , but painful  ROS Denies recent fever or chills. Denies sinus pressure, nasal congestion, ear pain or sore throat. Denies chest congestion, productive cough or wheezing. Denies chest pains, palpitations and leg swelling Denies abdominal pain, nausea, vomiting,diarrhea or constipation.   . Denies headaches, seizures, numbness, or tingling. Denies depression, anxiety or insomnia. Denies skin break down or rash.   PE  BP (!) 174/74   Pulse 84   Resp 16   Ht '5\' 4"'$  (1.626 m)   Wt 196 lb (88.9 kg)   SpO2 93%   BMI 33.64 kg/m   Patient alert and oriented and in no cardiopulmonary distress.  HEENT: No facial asymmetry, EOMI,     Neck supple .  Chest: Clear to auscultation bilaterally.  CVS: S1, S2 no murmurs, no S3.Regular rate.  ABD: Soft non tender.   Ext: No edema  MS: decreased  ROM lumbar spine,tender over  shoulders, hips and knees. Bruising , swelling and tenderness over dorsum of left foot esp proximal to left great toe Skin: Intact, no ulcerations or rash noted.  Psych: Good eye contact, normal affect. Memory intact not anxious or depressed appearing.  CNS: CN 2-12 intact, power,  normal throughout.no focal deficits noted.   Assessment & Plan  Fall Low back and left foot pain, X rays of both and shor course of hydrocodoen, maximum one at bedtime #10 total  Hypertension associated with diabetes (Kathryn Beck) Elevated at voisit, no med change as has hypotensive episodes while o dialysis from timeto time Managed by Nephrology  Diabetes mellitus (Kathryn Beck) Uncontrolled , however she reports this is improving Managed by Endo Ms.  Krisher is reminded of the importance of commitment to daily physical activity for 30 minutes or more, as able and the need to limit carbohydrate intake to 30 to 60 grams per meal to help with blood sugar control.   The need to take medication as prescribed, test blood sugar as directed, and to call between visits if there is a concern that blood sugar is uncontrolled is also discussed.   Kathryn Beck is reminded of the importance of daily foot exam, annual eye examination, and good blood sugar, blood pressure and cholesterol control.     Latest Ref Rng & Units 10/28/2021    1:06 PM 10/02/2021    8:05 AM 09/05/2021   10:08 AM 07/22/2021    8:17 AM 07/06/2021    9:35 AM  Diabetic Labs  HbA1c 4.0 - 5.6 % 8.9    9.1    Creatinine 0.44 - 1.00 mg/dL  7.08  4.38   5.06       10/31/2021    2:57 PM 10/28/2021   10:05 AM 10/02/2021    9:00 AM 09/18/2021    9:37 AM 09/05/2021    2:30 PM 09/05/2021    2:00 PM 09/05/2021   12:30 PM  BP/Weight  Systolic BP 073 710  626 948 546 270  Diastolic BP 74 72  68 77 83 67  Wt. (Lbs) 196 193 190.3 191.08     BMI 33.64 kg/m2 33.13 kg/m2 32.66  kg/m2 32.8 kg/m2         Latest Ref Rng & Units 07/24/2021    3:42 PM 03/06/2021    2:00 PM  Foot/eye exam completion dates  Eye Exam No Retinopathy No Retinopathy       Foot Form Completion   Done     This result is from an external source.

## 2021-11-02 ENCOUNTER — Encounter: Payer: Self-pay | Admitting: Family Medicine

## 2021-11-02 DIAGNOSIS — W19XXXA Unspecified fall, initial encounter: Secondary | ICD-10-CM | POA: Insufficient documentation

## 2021-11-02 NOTE — Assessment & Plan Note (Signed)
Low back and left foot pain, X rays of both and shor course of hydrocodoen, maximum one at bedtime #10 total

## 2021-11-02 NOTE — Assessment & Plan Note (Signed)
Uncontrolled , however she reports this is improving Managed by Endo Kathryn Beck is reminded of the importance of commitment to daily physical activity for 30 minutes or more, as able and the need to limit carbohydrate intake to 30 to 60 grams per meal to help with blood sugar control.   The need to take medication as prescribed, test blood sugar as directed, and to call between visits if there is a concern that blood sugar is uncontrolled is also discussed.   Kathryn Beck is reminded of the importance of daily foot exam, annual eye examination, and good blood sugar, blood pressure and cholesterol control.     Latest Ref Rng & Units 10/28/2021    1:06 PM 10/02/2021    8:05 AM 09/05/2021   10:08 AM 07/22/2021    8:17 AM 07/06/2021    9:35 AM  Diabetic Labs  HbA1c 4.0 - 5.6 % 8.9    9.1    Creatinine 0.44 - 1.00 mg/dL  7.08  4.38   5.06       10/31/2021    2:57 PM 10/28/2021   10:05 AM 10/02/2021    9:00 AM 09/18/2021    9:37 AM 09/05/2021    2:30 PM 09/05/2021    2:00 PM 09/05/2021   12:30 PM  BP/Weight  Systolic BP 832 549  826 415 830 940  Diastolic BP 74 72  68 77 83 67  Wt. (Lbs) 196 193 190.3 191.08     BMI 33.64 kg/m2 33.13 kg/m2 32.66 kg/m2 32.8 kg/m2         Latest Ref Rng & Units 07/24/2021    3:42 PM 03/06/2021    2:00 PM  Foot/eye exam completion dates  Eye Exam No Retinopathy No Retinopathy       Foot Form Completion   Done     This result is from an external source.

## 2021-11-02 NOTE — Assessment & Plan Note (Signed)
Elevated at voisit, no med change as has hypotensive episodes while o dialysis from timeto time Managed by Nephrology

## 2021-11-03 DIAGNOSIS — N2581 Secondary hyperparathyroidism of renal origin: Secondary | ICD-10-CM | POA: Diagnosis not present

## 2021-11-03 DIAGNOSIS — N186 End stage renal disease: Secondary | ICD-10-CM | POA: Diagnosis not present

## 2021-11-03 DIAGNOSIS — Z992 Dependence on renal dialysis: Secondary | ICD-10-CM | POA: Diagnosis not present

## 2021-11-05 ENCOUNTER — Ambulatory Visit (HOSPITAL_COMMUNITY)
Admission: RE | Admit: 2021-11-05 | Discharge: 2021-11-05 | Disposition: A | Discharge status: Home / Self Care | Payer: Medicare HMO | Source: Ambulatory Visit | Attending: Family Medicine | Admitting: Family Medicine

## 2021-11-05 DIAGNOSIS — Z992 Dependence on renal dialysis: Secondary | ICD-10-CM | POA: Diagnosis not present

## 2021-11-05 DIAGNOSIS — N2581 Secondary hyperparathyroidism of renal origin: Secondary | ICD-10-CM | POA: Diagnosis not present

## 2021-11-05 DIAGNOSIS — N186 End stage renal disease: Secondary | ICD-10-CM | POA: Diagnosis not present

## 2021-11-05 DIAGNOSIS — M5442 Lumbago with sciatica, left side: Secondary | ICD-10-CM | POA: Insufficient documentation

## 2021-11-05 DIAGNOSIS — M79672 Pain in left foot: Secondary | ICD-10-CM | POA: Insufficient documentation

## 2021-11-05 DIAGNOSIS — M7989 Other specified soft tissue disorders: Secondary | ICD-10-CM | POA: Diagnosis not present

## 2021-11-05 DIAGNOSIS — M533 Sacrococcygeal disorders, not elsewhere classified: Secondary | ICD-10-CM | POA: Diagnosis not present

## 2021-11-05 DIAGNOSIS — M545 Low back pain, unspecified: Secondary | ICD-10-CM | POA: Diagnosis not present

## 2021-11-05 DIAGNOSIS — M47816 Spondylosis without myelopathy or radiculopathy, lumbar region: Secondary | ICD-10-CM | POA: Diagnosis not present

## 2021-11-06 ENCOUNTER — Ambulatory Visit (INDEPENDENT_AMBULATORY_CARE_PROVIDER_SITE_OTHER): Payer: Medicare HMO | Admitting: Family Medicine

## 2021-11-06 ENCOUNTER — Encounter: Payer: Self-pay | Admitting: Family Medicine

## 2021-11-06 VITALS — BP 152/72 | HR 86 | Resp 16 | Ht 64.0 in | Wt 194.4 lb

## 2021-11-06 DIAGNOSIS — Z0001 Encounter for general adult medical examination with abnormal findings: Secondary | ICD-10-CM

## 2021-11-06 DIAGNOSIS — M5410 Radiculopathy, site unspecified: Secondary | ICD-10-CM

## 2021-11-06 DIAGNOSIS — D126 Benign neoplasm of colon, unspecified: Secondary | ICD-10-CM

## 2021-11-06 NOTE — Patient Instructions (Addendum)
F/U in 3 months, call iof you need me sooner  You are referred for colonoscopy  Continue to work with Endocrinology closely  Careful not to fall   We will send for walker with bench  Thanks for choosing [facility], we consider it a privelige to serve you.

## 2021-11-07 ENCOUNTER — Encounter: Payer: Medicare Other | Admitting: Family Medicine

## 2021-11-07 DIAGNOSIS — N2581 Secondary hyperparathyroidism of renal origin: Secondary | ICD-10-CM | POA: Diagnosis not present

## 2021-11-07 DIAGNOSIS — N186 End stage renal disease: Secondary | ICD-10-CM | POA: Diagnosis not present

## 2021-11-07 DIAGNOSIS — Z992 Dependence on renal dialysis: Secondary | ICD-10-CM | POA: Diagnosis not present

## 2021-11-09 DIAGNOSIS — Z0001 Encounter for general adult medical examination with abnormal findings: Secondary | ICD-10-CM | POA: Insufficient documentation

## 2021-11-09 NOTE — Assessment & Plan Note (Signed)
Annual exam as documented. . Immunization and cancer screening needs are specifically addressed at this visit.  

## 2021-11-09 NOTE — Progress Notes (Signed)
    Kathryn Beck     MRN: [id number]      DOB: 08/10/1955  HPI: Patient is in for annual physical exam. No other health concerns are expressed or addressed at the visit. Recent labs,  are reviewed. Immunization is reviewed , and  updated if needed.   PE: BP (!) 152/72   Pulse 86   Resp 16   Ht '5\' 4"'$  (1.626 m)   Wt 194 lb 6.4 oz (88.2 kg)   SpO2 95%   BMI 33.37 kg/m   Pleasant  female, alert and oriented x 3, in no cardio-pulmonary distress. Afebrile. HEENT No facial trauma or asymetry. Sinuses non tender.  Extra occullar muscles intact.. External ears normal, . Neck: supple, no adenopathy,JVD or thyromegaly.No bruits.  Chest: Clear to ascultation bilaterally.No crackles or wheezes. Non tender to palpation  Cardiovascular system; Heart sounds normal,  S1 and  S2 ,no S3.  No murmur, or thrill. Apical beat not displaced Peripheral pulses normal.  Abdomen: Soft, non tender,  .   Musculoskeletal exam: Decreased  ROM of spine, hips , shoulders and knees.  deformity ,swelling or crepitus noted.  Neurologic: Cranial nerves 2 to 12 intact. Power, tone ,sensation  normal throughout.  disturbance in gait. No tremor.  Skin: Intact, no ulceration, erythema , scaling or rash noted. Pigmentation normal throughout  Psych; Normal mood and affect. Judgement and concentration normal   Assessment & Plan:  Encounter for Medicare annual examination with abnormal findings Annual exam as documented. Immunization and cancer screening needs are specifically addressed at this visit.   Back pain with left-sided radiculopathy Decreased ROM spine with bilateral lower extremity weakness, ambulates with walker, get short of breath weasily, needs walker with seat

## 2021-11-09 NOTE — Assessment & Plan Note (Signed)
Decreased ROM spine with bilateral lower extremity weakness, ambulates with walker, get short of breath weasily, needs walker with seat

## 2021-11-10 ENCOUNTER — Encounter: Payer: Self-pay | Admitting: "Endocrinology

## 2021-11-10 ENCOUNTER — Ambulatory Visit (INDEPENDENT_AMBULATORY_CARE_PROVIDER_SITE_OTHER): Payer: Medicare HMO | Admitting: "Endocrinology

## 2021-11-10 VITALS — BP 148/62 | HR 76 | Ht 64.0 in | Wt 199.0 lb

## 2021-11-10 DIAGNOSIS — N2581 Secondary hyperparathyroidism of renal origin: Secondary | ICD-10-CM | POA: Diagnosis not present

## 2021-11-10 DIAGNOSIS — N186 End stage renal disease: Secondary | ICD-10-CM | POA: Diagnosis not present

## 2021-11-10 DIAGNOSIS — M818 Other osteoporosis without current pathological fracture: Secondary | ICD-10-CM

## 2021-11-10 DIAGNOSIS — Z992 Dependence on renal dialysis: Secondary | ICD-10-CM | POA: Diagnosis not present

## 2021-11-10 MED ORDER — CALCITRIOL 0.25 MCG PO CAPS: 0.2500 ug | ORAL_CAPSULE | Freq: Two times a day (BID) | ORAL | 6 refills | Status: DC

## 2021-11-10 NOTE — Progress Notes (Signed)
Endocrinology Consult Note                                            11/10/2021, 5:28 PM   Subjective:    Patient ID: Kathryn Beck, female    DOB: [birth year], PCP Fayrene Helper, MD   Past Medical History:  Diagnosis Date   Acid reflux    Anemia    Arthritis    Axillary masses    Soft tissue - status post excision   Back pain    COVID-19 virus infection 04/06/2019   Depression    End-stage renal disease ([place])    TTHSat  in [place]   Essential hypertension    History of blood transfusion    History of cardiac catheterization    Normal coronary arteries October 2020   History of claustrophobia    History of pneumonia 2019   Hypoxia 04/03/2019   Mixed hyperlipidemia    Obesity    Pancreatitis    Peritoneal dialysis catheter in place [facility])    Pneumonia due to COVID-19 virus 04/02/2019   Sleep apnea    Noncompliant with CPAP   Type 2 diabetes mellitus ([place])    Past Surgical History:  Procedure Laterality Date   ABDOMINAL HYSTERECTOMY     AV FISTULA PLACEMENT Left 09/02/2017   Procedure: creation of left arm ARTERIOVENOUS (AV) FISTULA;  Surgeon: Serafina Mitchell, MD;  Location: [place];  Service: Vascular;  Laterality: Left;   COLONOSCOPY  2008   Dr. Oneida Alar: normal    COLONOSCOPY N/A 12/18/2016   Dr. Oneida Alar: multiple tubular adenomas, internal hemorrhoids. Surveillance in 3 years    ESOPHAGEAL DILATION N/A 10/13/2015   Procedure: ESOPHAGEAL DILATION;  Surgeon: Rogene Houston, MD;  [facility];  Service: Endoscopy;  Laterality: N/A;   ESOPHAGOGASTRODUODENOSCOPY N/A 10/13/2015   Dr. Laural Golden: chronic gastritis on path, no H.pylori. Empiric dilation    ESOPHAGOGASTRODUODENOSCOPY N/A 12/18/2016   Dr. Oneida Alar: mild gastritis. BRAVO study revealed uncontrolled GERD. Dysphagia secondary to uncontrolled reflux   FOOT SURGERY Bilateral    "nerve"     LEFT HEART CATH AND CORONARY ANGIOGRAPHY N/A 12/29/2018   Procedure: LEFT HEART CATH AND CORONARY  ANGIOGRAPHY;  Surgeon: Jettie Booze, MD;  Location: [place] [facility];  Service: Cardiovascular;  Laterality: N/A;   LUNG BIOPSY     MASS EXCISION Right 01/09/2013   Procedure: EXCISION OF NEOPLASM OF RIGHT  AXILLA  AND EXCISION OF NEOPLASM OF LEFT AXILLA;  Surgeon: Jamesetta So, MD;  Location[facility];  Service: General;  Laterality: Right;  procedure end @ 08:23   MYRINGOTOMY WITH TUBE PLACEMENT Bilateral 04/28/2017   Procedure: BILATERAL MYRINGOTOMY WITH TUBE PLACEMENT;  Surgeon: Leta Baptist, MD;  Location: [place];  Service: ENT;  Laterality: Bilateral;   REVISION OF ARTERIOVENOUS GORETEX GRAFT Left 05/04/2018   Procedure: TRANSPOSITION OF CEPHALIC VEIN ARTERIOVENOUS FISTULA LEFT ARM;  Surgeon: Rosetta Posner, MD;  Location: [place];  Service: Vascular;  Laterality: Left;   SAVORY DILATION N/A 12/18/2016   Procedure: SAVORY DILATION;  Surgeon: Danie Binder, MD;  [facility];  Service: Endoscopy;  Laterality: N/A;   Social History   Socioeconomic History   Marital status: Married    Spouse name: larry    Number of children: 2   Years of education: 48  Highest education level: 12th grade  Occupational History   Occupation: retired   Tobacco Use   Smoking status: Never   Smokeless tobacco: Never  Vaping Use   Vaping Use: Never used  Substance and Sexual Activity   Alcohol use: No   Drug use: No   Sexual activity: Not Currently  Other Topics Concern   Not on file  Social History Narrative   Lives alone with husband    Social Determinants of Health   Financial Resource Strain: High Risk (05/08/2021)   Overall Financial Resource Strain (CARDIA)    Difficulty of Paying Living Expenses: Hard  Food Insecurity: Food Insecurity Present (05/08/2021)   Hunger Vital Sign    Worried About Running Out of Food in the Last Year: Sometimes true    Ran Out of Food in the Last Year: Never true  Transportation Needs: Unmet Transportation Needs (05/08/2021)   PRAPARE -  Transportation    Lack of Transportation (Medical): Yes    Lack of Transportation (Non-Medical): Yes  Physical Activity: Inactive (10/16/2021)   Exercise Vital Sign    Days of Exercise per Week: 0 days    Minutes of Exercise per Session: 0 min  Stress: Stress Concern Present (10/16/2021)   [place]    Feeling of Stress : To some extent  Social Connections: Socially Integrated (05/08/2021)   Social Connection and Isolation Panel [NHANES]    Frequency of Communication with Friends and Family: More than three times a week    Frequency of Social Gatherings with Friends and Family: Once a week    Attends Religious Services: More than 4 times per year    Active Member of [employer] or Organizations: Yes    Attends Archivist Meetings: 1 to 4 times per year    Marital Status: Married   Family History  Problem Relation Age of Onset   Hypertension Father    Hypercholesterolemia Father    Arthritis Father    Hypertension Sister    Hypercholesterolemia Sister    Breast cancer Sister    Hypertension Sister    Colon cancer Neg Hx    Colon polyps Neg Hx    Outpatient Encounter Medications as of 11/10/2021  Medication Sig   calcitRIOL (ROCALTROL) 0.25 MCG capsule Take 1 capsule (0.25 mcg total) by mouth 2 (two) times daily with a meal.   aspirin 81 MG chewable tablet CHEW 2 TABLETS BY MOUTH ONCE DAILY   cloNIDine (CATAPRES) 0.2 MG tablet Take 0.2 mg by mouth 2 (two) times daily.   DULoxetine (CYMBALTA) 60 MG capsule TAKE (1) CAPSULE BY MOUTH ONCE DAILY. (Patient taking differently: Take 60 mg by mouth daily.)   ezetimibe (ZETIA) 10 MG tablet TAKE 1 TABLET BY MOUTH ONCE DAILY. (Patient taking differently: Take 10 mg by mouth daily.)   insulin aspart (FIASP FLEXTOUCH) 100 UNIT/ML FlexTouch Pen Inject 18-30 Units into the skin with breakfast, with lunch, and with evening meal.   insulin glargine, 2 Unit Dial, (TOUJEO MAX  SOLOSTAR) 300 UNIT/ML Solostar Pen Inject 80 Units into the skin daily.   Insulin Pen Needle 32G X 4 MM MISC Use 4x a day   isosorbide mononitrate (IMDUR) 30 MG 24 hr tablet Take 1 tablet (30 mg total) by mouth daily.   metoprolol succinate (TOPROL-XL) 50 MG 24 hr tablet TAKE (1) TABLET BY MOUTH DAILY WITH FOOD *TAKE AFTER DIALYSIS*   pregabalin (LYRICA) 50 MG capsule TAKE (1) CAPSULE BY MOUTH THREE  TIMES DAILY (Patient taking differently: Take 50 mg by mouth 3 (three) times daily.)   sertraline (ZOLOFT) 100 MG tablet Take 1 tablet (100 mg total) by mouth daily.   sertraline (ZOLOFT) 50 MG tablet TAKE 1 TABLET BY MOUTH ONCE DAILY.   [DISCONTINUED] FLUoxetine (PROZAC) 10 MG capsule Take 10 mg by mouth daily.     [DISCONTINUED] glipiZIDE (GLUCOTROL) 10 MG tablet Take 10 mg by mouth 2 (two) times daily before a meal.     Facility-Administered Encounter Medications as of 11/10/2021  Medication   0.9 %  sodium chloride infusion   ALLERGIES: Allergies  Allergen Reactions   Ace Inhibitors Anaphylaxis and Swelling   Penicillins Itching, Swelling and Other (See Comments)    Did it involve swelling of the face/tongue/throat, SOB, or low BP? Unknown Did it involve sudden or severe rash/hives, skin peeling, or any reaction on the inside of your mouth or nose? Unknown Did you need to seek medical attention at a hospital or doctor's office? Unknown When did it last happen?      years  If all above answers are "NO", may proceed with cephalosporin use.    Statins Other (See Comments)    elevated LFT's     Albuterol Swelling    VACCINATION STATUS: Immunization History  Administered Date(s) Administered   H1N1 01/16/2008   Hepatitis B 05/14/2018, 06/16/2018, 07/14/2018, 12/26/2018   Hepatitis B, adult 05/14/2018, 06/16/2018, 07/14/2018, 12/26/2018, 01/29/2020, 02/28/2020, 03/06/2020, 07/29/2020   Hepatitis B, ped/adol 05/14/2018, 06/16/2018, 07/14/2018, 12/26/2018   Influenza Split 12/22/2012,  03/12/2015, 01/14/2016, 11/24/2016, 11/09/2017   Influenza Whole 12/28/2004, 02/24/2006, 12/07/2006, 02/01/2008, 01/28/2009, 11/25/2010   Influenza, High Dose Seasonal PF 12/30/2018   Influenza,inj,Quad PF,6+ Mos 12/22/2012, 03/12/2015, 01/14/2016, 11/24/2016, 11/09/2017, 01/02/2021   Influenza-Unspecified 12/26/2019   Moderna SARS-COV2 Booster Vaccination 04/30/2020   Moderna Sars-Covid-2 Vaccination 09/12/2019, 10/23/2019, 04/30/2020   PNEUMOCOCCAL CONJUGATE-20 07/08/2021   PPD Test 03/22/2018, 02/10/2021   Pneumococcal Conjugate Vaccine, 10 Valent 07/31/2020   Pneumococcal Conjugate-13 07/25/2015   Pneumococcal Polysaccharide-23 01/28/2009, 07/25/2015, 07/31/2020   Td 11/12/2003   Tdap 10/02/2014   Zoster Recombinat (Shingrix) 12/24/2016   Zoster, Unspecified 12/24/2016    HPI Kathryn Beck is 66 y.o. female who presents today with a medical history as above. she is being seen in consultation for osteoporosis requested by Fayrene Helper, MD.  This patient was previously seen in this clinic for diabetes management.  However, she wishes to keep managing her diabetes with her primary care doctor. She is now on dialysis for end-stage renal disease as a complication of uncontrolled diabetes.  She had bone density on September 25, 2021 which showed T score of -2.0 on the spine, -2.9 in the hip. She is not on anti-osteoporosis treatment. She has hypocalcemia, not on vitamin D supplements. She denies fractures, cannot confirm if she has lost height. She has multiple medical problems including hypertension, CHF, hyperlipidemia, ischemic heart disease, sleep apnea, ESRD on hemodialysis. 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  Review of Systems  Constitutional: + Significantly fluctuating body weight , + fatigue, no subjective hyperthermia, no subjective hypothermia Eyes: no blurry vision, no xerophthalmia ENT: no sore throat, no nodules palpated in throat, no  dysphagia/odynophagia, no hoarseness Cardiovascular: no Chest Pain, no Shortness of Breath, no palpitations, no leg swelling Respiratory: no cough, no shortness of breath Gastrointestinal: no Nausea/Vomiting/Diarhhea Musculoskeletal: no muscle/joint aches Skin: no rashes Neurological: no tremors, no numbness, no tingling, no dizziness Psychiatric: no depression,  no anxiety  Objective:       11/10/2021    9:11 AM 11/06/2021    8:44 AM 11/06/2021    8:11 AM  Vitals with BMI  Height '5\' 4"'$   '5\' 4"'$   Weight 199 lbs  194 lbs 6 oz  BMI 38.18  29.93  Systolic 716 967 893  Diastolic 62 72 78  Pulse 76  86    BP (!) 148/62   Pulse 76   Ht '5\' 4"'$  (1.626 m)   Wt 199 lb (90.3 kg)   BMI 34.16 kg/m   Wt Readings from Last 3 Encounters:  11/10/21 199 lb (90.3 kg)  11/06/21 194 lb 6.4 oz (88.2 kg)  10/31/21 196 lb (88.9 kg)    Physical Exam  Constitutional:  Body mass index is 34.16 kg/m.,  not in acute distress, normal state of mind Eyes: PERRLA, EOMI, no exophthalmos ENT: moist mucous membranes, no gross thyromegaly, no gross cervical lymphadenopathy Cardiovascular: normal precordial activity, Regular Rate and Rhythm, no Murmur/Rubs/Gallops Respiratory:  adequate breathing efforts, no gross chest deformity, Clear to auscultation bilaterally Gastrointestinal: abdomen soft, Non -tender, No distension, Bowel Sounds present, no gross organomegaly Musculoskeletal: no gross deformities, strength intact in all four extremities Skin: moist, warm, no rashes Neurological: no tremor with outstretched hands, Deep tendon reflexes normal in bilateral lower extremities.  CMP ( most recent) CMP     Component Value Date/Time   NA 141 10/02/2021 0805   NA 139 07/17/2019 1157   K 3.7 10/28/2021 1040   CL 102 10/02/2021 0805   CO2 27 10/02/2021 0805   GLUCOSE 166 (H) 10/02/2021 0805   BUN 32 (H) 10/02/2021 0805   BUN 43 (A) 07/17/2019 1157   CREATININE 7.08 (H) 10/02/2021 0805   CREATININE 3.74  (H) 08/23/2017 1407   CALCIUM 8.8 (L) 10/02/2021 0805   CALCIUM 9.4 10/15/2017 1258   PROT 6.6 10/02/2021 0805   ALBUMIN 3.6 10/02/2021 0805   AST 21 10/02/2021 0805   ALT 28 10/02/2021 0805   ALKPHOS 66 10/02/2021 0805   BILITOT 0.3 10/02/2021 0805   GFRNONAA 6 (L) 10/02/2021 0805   GFRNONAA 12 (L) 08/23/2017 1407   GFRAA 8 (L) 07/19/2019 1210   GFRAA 14 (L) 08/23/2017 1407     Diabetic Labs (most recent): Lab Results  Component Value Date   HGBA1C 8.9 (A) 10/28/2021   HGBA1C 9.1 (A) 07/22/2021   HGBA1C 11.4 (A) 05/02/2021   MICROALBUR 75.9 02/28/2016   MICROALBUR 3,246.9 (H) 10/09/2015   MICROALBUR 92.2 (H) 10/02/2014     Lipid Panel ( most recent) Lipid Panel     Component Value Date/Time   CHOL 190 11/21/2020 1402   TRIG 259 (H) 12/02/2020 1754   HDL 43.10 11/21/2020 1402   CHOLHDL 4 11/21/2020 1402   VLDL 54.4 (H) 11/21/2020 1402   LDLCALC 114 (H) 03/08/2018 1234   LDLCALC 109 (H) 11/12/2017 0936   LDLDIRECT 120.0 11/21/2020 1402      Lab Results  Component Value Date   TSH 0.91 10/28/2021   TSH 4.16 12/24/2016   TSH 2.006 04/25/2015   TSH 2.848 10/02/2014   TSH 1.923 12/20/2012   TSH 2.329 10/19/2011   TSH 2.162 08/19/2010   TSH 3.751 01/28/2009   TSH 3.08 12/09/2007   FREET4 0.93 10/28/2021   FREET4 0.8 12/09/2007   FREET4 1.56 06/02/2007      Assessment & Plan:   1. Other osteoporosis without current pathological fracture  - Kathryn Beck  is being  seen at a kind request of Fayrene Helper, MD. - I have reviewed her available bone density records and clinically evaluated the patient. - Based on these reviews, she has osteoporosis of the hips, osteopenia on the spine,  however, in ESRD patients the gold standard diagnostic studies bone biopsy.  However, considering her multiple comorbidities, it is advised to avoid this invasive test on her for now.   After correcting her hypoglycemia, she will be considered for Prolia injection. Hence,  I discussed and prescribed calcitriol 0.25 mcg p.o. twice daily.  She will have repeat CMP and office visit in 6 months. I discussed treatment options, she was sleeping this morning.  She will need more detailed discussion at next visit.  I discussed fall precautions, and adequate protein intake, reasonably plant-based. She wishes to continue her diabetes care with her PMD.   - she is advised to maintain close follow up with Fayrene Helper, MD for primary care needs.   - Time spent with the patient: 50 minutes, of which >50% was spent in  counseling her about her osteoporosis, hypocalcemia and the rest in obtaining information about her symptoms, reviewing her previous labs/studies ( including abstractions from other facilities),  evaluations, and treatments,  and developing a plan to confirm diagnosis and long term treatment based on the latest standards of care/guidelines; and documenting her care.  Kathryn Beck participated in the discussions, expressed understanding, and voiced agreement with the above plans.  All questions were answered to her satisfaction. she is encouraged to contact clinic should she have any questions or concerns prior to her return visit.  Follow up plan: Return in about 6 months (around 05/13/2022) for F/U with Pre-visit Labs.   Glade Lloyd, MD [facility] [address] [place], [place] [address] Phone: [phone]  Fax: [phone]     11/10/2021, 5:28 PM  This note was partially dictated with voice recognition software. Similar sounding words can be transcribed inadequately or may not  be corrected upon review.

## 2021-11-11 ENCOUNTER — Telehealth: Payer: Self-pay

## 2021-11-11 ENCOUNTER — Other Ambulatory Visit: Payer: Self-pay | Admitting: "Endocrinology

## 2021-11-11 NOTE — Telephone Encounter (Signed)
PA request for calcitriol sent to insurance through Covermymeds.

## 2021-11-12 ENCOUNTER — Encounter: Payer: Self-pay | Admitting: *Deleted

## 2021-11-12 DIAGNOSIS — N186 End stage renal disease: Secondary | ICD-10-CM | POA: Diagnosis not present

## 2021-11-12 DIAGNOSIS — N2581 Secondary hyperparathyroidism of renal origin: Secondary | ICD-10-CM | POA: Diagnosis not present

## 2021-11-12 DIAGNOSIS — Z992 Dependence on renal dialysis: Secondary | ICD-10-CM | POA: Diagnosis not present

## 2021-11-12 LAB — ALDOSTERONE + RENIN ACTIVITY W/ RATIO: Renin Activity: 0.46 ng/mL/h (ref 0.25–5.82)

## 2021-11-12 LAB — GROWTH HORMONE: Growth Hormone: 0.1 ng/mL (ref <=7.1)

## 2021-11-12 LAB — CATECHOLAMINES, FRACTIONATED, PLASMA
Dopamine: <10 pg/mL
Epinephrine: 31 pg/mL
Norepinephrine: 736 pg/mL
Total Catecholamines: 767 pg/mL

## 2021-11-12 LAB — INSULIN-LIKE GROWTH FACTOR
IGF-I, LC/MS: 141 ng/mL (ref 41–279)
Z-Score (Female): 0.4 SD (ref ?–2.0)

## 2021-11-12 LAB — METANEPHRINES, PLASMA
Metanephrine, Free: <25 pg/mL (ref <=57)
Normetanephrine, Free: 74 pg/mL (ref <=148)
Total Metanephrines-Plasma: 74 pg/mL (ref <=205)

## 2021-11-12 LAB — PROLACTIN: Prolactin: 11.4 ng/mL

## 2021-11-14 DIAGNOSIS — Z992 Dependence on renal dialysis: Secondary | ICD-10-CM | POA: Diagnosis not present

## 2021-11-14 DIAGNOSIS — N186 End stage renal disease: Secondary | ICD-10-CM | POA: Diagnosis not present

## 2021-11-14 DIAGNOSIS — N2581 Secondary hyperparathyroidism of renal origin: Secondary | ICD-10-CM | POA: Diagnosis not present

## 2021-11-17 DIAGNOSIS — Z992 Dependence on renal dialysis: Secondary | ICD-10-CM | POA: Diagnosis not present

## 2021-11-17 DIAGNOSIS — N2581 Secondary hyperparathyroidism of renal origin: Secondary | ICD-10-CM | POA: Diagnosis not present

## 2021-11-17 DIAGNOSIS — N186 End stage renal disease: Secondary | ICD-10-CM | POA: Diagnosis not present

## 2021-11-17 DIAGNOSIS — T82898A Other specified complication of vascular prosthetic devices, implants and grafts, initial encounter: Secondary | ICD-10-CM | POA: Diagnosis not present

## 2021-11-19 DIAGNOSIS — N186 End stage renal disease: Secondary | ICD-10-CM | POA: Diagnosis not present

## 2021-11-19 DIAGNOSIS — Z992 Dependence on renal dialysis: Secondary | ICD-10-CM | POA: Diagnosis not present

## 2021-11-19 DIAGNOSIS — N2581 Secondary hyperparathyroidism of renal origin: Secondary | ICD-10-CM | POA: Diagnosis not present

## 2021-11-20 ENCOUNTER — Ambulatory Visit: Payer: Medicare HMO

## 2021-11-20 DIAGNOSIS — N186 End stage renal disease: Secondary | ICD-10-CM | POA: Diagnosis not present

## 2021-11-20 DIAGNOSIS — F322 Major depressive disorder, single episode, severe without psychotic features: Secondary | ICD-10-CM

## 2021-11-20 DIAGNOSIS — Z992 Dependence on renal dialysis: Secondary | ICD-10-CM | POA: Diagnosis not present

## 2021-11-20 DIAGNOSIS — E1122 Type 2 diabetes mellitus with diabetic chronic kidney disease: Secondary | ICD-10-CM

## 2021-11-20 DIAGNOSIS — M810 Age-related osteoporosis without current pathological fracture: Secondary | ICD-10-CM

## 2021-11-20 DIAGNOSIS — R413 Other amnesia: Secondary | ICD-10-CM

## 2021-11-20 DIAGNOSIS — E1159 Type 2 diabetes mellitus with other circulatory complications: Secondary | ICD-10-CM

## 2021-11-20 NOTE — Chronic Care Management (AMB) (Signed)
Care Management Clinical Social Work Note  11/20/2021 Name: Kathryn Beck MRN: [id number] DOB: 06-02-1955  Kathryn Beck is a 66 y.o. year old female who is a primary care patient of Fayrene Helper, MD.  The Care Management team was consulted for assistance with chronic disease management and coordination needs.  Engaged with patient / sister of patient, Artis Flock, by telephone for follow up visit in response to provider referral for social work chronic care management and care coordination services  Consent to Services:  Ms. Reif was given information about Care Management services today including:  Care Management services includes personalized support from designated clinical staff supervised by her physician, including individualized plan of care and coordination with other care providers 24/7 contact phone numbers for assistance for urgent and routine care needs. The patient may stop case management services at any time by phone call to the office staff.  Patient agreed to services and consent obtained.   Assessment: Review of patient past medical history, allergies, medications, and health status, including review of relevant consultants reports was performed today as part of a comprehensive evaluation and provision of chronic care management and care coordination services.  SDOH (Social Determinants of Health) assessments and interventions performed:  SDOH Interventions    Flowsheet Row Most Recent Value  SDOH Interventions   Physical Activity Interventions Other (Comments)  [walking challenges]  Stress Interventions Other (Comment)  [client has stress related to managing medical needs]        Advanced Directives Status: See Vynca application for related entries.  Care Plan  Allergies  Allergen Reactions   Ace Inhibitors Anaphylaxis and Swelling   Penicillins Itching, Swelling and Other (See Comments)    Did it involve swelling of the face/tongue/throat,  SOB, or low BP? Unknown Did it involve sudden or severe rash/hives, skin peeling, or any reaction on the inside of your mouth or nose? Unknown Did you need to seek medical attention at a hospital or doctor's office? Unknown When did it last happen?      years  If all above answers are "NO", may proceed with cephalosporin use.    Statins Other (See Comments)    elevated LFT's     Albuterol Swelling    Outpatient Encounter Medications as of 11/20/2021  Medication Sig Note   aspirin 81 MG chewable tablet CHEW 2 TABLETS BY MOUTH ONCE DAILY    calcitRIOL (ROCALTROL) 0.25 MCG capsule TAKE 1 CAPSULE (0.25 MCG TOTAL) BY MOUTH 2 (TWO) TIMES DAILY WITH A MEAL.    cloNIDine (CATAPRES) 0.2 MG tablet Take 0.2 mg by mouth 2 (two) times daily.    DULoxetine (CYMBALTA) 60 MG capsule TAKE (1) CAPSULE BY MOUTH ONCE DAILY. (Patient taking differently: Take 60 mg by mouth daily.)    ezetimibe (ZETIA) 10 MG tablet TAKE 1 TABLET BY MOUTH ONCE DAILY. (Patient taking differently: Take 10 mg by mouth daily.)    insulin aspart (FIASP FLEXTOUCH) 100 UNIT/ML FlexTouch Pen Inject 18-30 Units into the skin with breakfast, with lunch, and with evening meal.    insulin glargine, 2 Unit Dial, (TOUJEO MAX SOLOSTAR) 300 UNIT/ML Solostar Pen Inject 80 Units into the skin daily. 11/06/2021: Takes 60 units daily, reported 10/2021   Insulin Pen Needle 32G X 4 MM MISC Use 4x a day    isosorbide mononitrate (IMDUR) 30 MG 24 hr tablet Take 1 tablet (30 mg total) by mouth daily.    metoprolol succinate (TOPROL-XL) 50 MG 24 hr tablet TAKE (1) TABLET  BY MOUTH DAILY WITH FOOD *TAKE AFTER DIALYSIS*    pregabalin (LYRICA) 50 MG capsule TAKE (1) CAPSULE BY MOUTH THREE TIMES DAILY (Patient taking differently: Take 50 mg by mouth 3 (three) times daily.)    sertraline (ZOLOFT) 100 MG tablet Take 1 tablet (100 mg total) by mouth daily.    sertraline (ZOLOFT) 50 MG tablet TAKE 1 TABLET BY MOUTH ONCE DAILY.    [DISCONTINUED] FLUoxetine (PROZAC)  10 MG capsule Take 10 mg by mouth daily.      [DISCONTINUED] glipiZIDE (GLUCOTROL) 10 MG tablet Take 10 mg by mouth 2 (two) times daily before a meal.      Facility-Administered Encounter Medications as of 11/20/2021  Medication   0.9 %  sodium chloride infusion    Patient Active Problem List   Diagnosis Date Noted   Other osteoporosis without current pathological fracture 11/10/2021   Hypocalcemia 11/10/2021   Encounter for Medicare annual examination with abnormal findings 11/09/2021   Fall 11/02/2021   Left foot pain 10/31/2021   Low back pain with left-sided sciatica 10/31/2021   Acute right otitis media 09/18/2021   Left ear impacted cerumen 09/18/2021   Sore throat 06/10/2021   Chronic left shoulder pain 06/10/2021   Abnormal CXR 03/10/2021   Ear pain, bilateral 03/10/2021   Morbid obesity ([place]) 03/10/2021   Subungual hematoma of second toe of left foot 10/28/2020   Insomnia 10/28/2020   Memory loss 09/26/2020   Forgetfulness 09/26/2020   Recurrent vertigo 09/26/2020   Abnormal MRI, cervical spine 03/04/2020   Right leg weakness 02/28/2020   Knee pain, right 10/23/2019   Cough 08/09/2019   Carpal tunnel syndrome 06/13/2019   Chronic pain syndrome 06/13/2019   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[place]) 06/13/2019   Neck pain 03/23/2019   Stage 5 chronic kidney disease on chronic dialysis ([place]) 12/08/2018   Near syncope 12/05/2018   ESRD on hemodialysis ([place]) 11/22/2018   Ischemic heart disease 09/25/2018   Renal disease 08/22/2018   Not currently working due to disabled status 06/13/2018   ESRD on dialysis ([place]) 06/12/2018   Hypertension associated with diabetes ([place]) 06/12/2018   Depression, major, single episode, severe ([place]) 03/09/2018   Adrenal mass, left ([place]) 11/09/2017   MGUS (monoclonal gammopathy of unknown significance) 11/05/2017   Shoulder pain 11/01/2017   Chronic diastolic (congestive) heart failure ([place]) 09/20/2017   Bilateral leg  weakness 09/09/2017   Adrenal adenoma 09/03/2017   Uncontrolled type 2 diabetes mellitus 08/18/2017   Unsteady gait 07/11/2017   Recurrent falls 07/11/2017   Anemia of chronic disease 03/24/2017   Personal history of colonic polyps 02/10/2017   Dysphagia 11/05/2016   Irritable bowel syndrome 02/04/2016   Intolerant of heat 10/07/2014   Cardiac murmur 01/17/2014   Back pain with left-sided radiculopathy 09/18/2013   Seasonal allergies 03/10/2013   Lipoma of back 12/22/2012   Other seasonal allergic rhinitis 08/22/2012   Sleep apnea 06/02/2011   Chronic kidney disease, stage 4 (severe) ([place]) 01/13/2011   Neuropathy 04/16/2010   Malaise and fatigue 07/24/2009   Personal history of other diseases of the nervous system and sense organs 05/08/2009   LUPUS ERYTHEMATOSUS, DISCOID 06/05/2008   Arm pain 05/30/2008   Hyperlipidemia 06/07/2007   Obesity 06/07/2007   Malignant hypertension 06/07/2007   Type 2 diabetes mellitus with hyperglycemia ([place]) 06/07/2007   Diabetes mellitus ([place]) 06/07/2007    Conditions to be addressed/monitored: monitor client management of depression issues  Care Plan : LCSW Care Plan  Updates  made by Katha Cabal, LCSW since 11/20/2021 12:00 AM     Problem: Emotional Distress      Goal: Emotional Distress identified. Manage Depression. Manage anxiety issues   Start Date: 09/11/2021  Expected End Date: 12/11/2021  This Visit's Progress: On track  Recent Progress: Not on track  Priority: Medium  Note:   Current Barriers:   Mobility issues Financial challenges Sleeping issues Decreased appetite sometimes Suicidal Ideation/Homicidal Ideation: No  Clinical Social Work Goal(s):  patient will work with SW  by telephone or in person to reduce or manage symptoms related to depression and anxiety Patinet will attend scheduled medical appointments in next 30 days Patient will communicate with RNCM as needed in next 30 days for nursing  support  Interventions: 1:1 collaboration with Fayrene Helper, MD regarding development and update of comprehensive plan of care as evidenced by provider attestation and co-signature Discussed client needs with Artis Flock, sister of client Discussed family support. She has support from her spouse and from her daughter Reviewed medication procurement Reviewed dialysis treatments of client. Client receives dialysis treatment 3 times weekly Informed Lucita Ferrara of Care Coordination program support for client. Informed Lucita Ferrara that RN Joellyn Quails was available for nursing support with program. Informed Lucita Ferrara that LCSW Nat Christen was available for SW support with program Lucita Ferrara asked about in home care support for client.Marland Kitchen LCSW gave Lucita Ferrara the name and phone number for [facility] in [place], [place].   LCSW recommended that client talk with PCP about Care Coordination program support   Patient  Coping Strengths:  Attends scheduled medical appointments Attends Dialysis treatments weekly as scheduled. Takes medications as prescribed  Patient Self Care Deficits:  Mobility issues Depression issues Sleeping challenges  Patient Goals:  - spend time or talk with others at least 2 to 3 times per week - practice relaxation or meditation daily - keep a calendar with appointment dates  Follow Up Plan: Client to talk with PCP as needed about Care Coordination program support for client     Norva Riffle.Clydell Sposito MSW, [place] [employer] representative [facility] Care Management [phone]

## 2021-11-20 NOTE — Patient Instructions (Signed)
Visit Information  Thank you for taking time to visit with me today. Please don't hesitate to contact me if I can be of assistance to you before our next scheduled telephone appointment.  Following are the goals we discussed today:   Client to talk with PCP about Care Coordination program support  Please call the care guide team at [phone] if you need to cancel or reschedule your appointment.   If you are experiencing a Mental Health or [place] or need someone to talk to, please go to [facility] [address] [place] [phone])   Following is a copy of your full plan of care:  Care Plan : [place]  Updates made by Kathryn Cabal, LCSW since 11/20/2021 12:00 AM     Problem: Emotional Distress      Goal: Emotional Distress identified. Manage Depression. Manage anxiety issues   Start Date: 09/11/2021  Expected End Date: 12/11/2021  This Visit's Progress: On track  Recent Progress: Not on track  Priority: Medium  Note:   Current Barriers:   Mobility issues Financial challenges Sleeping issues Decreased appetite sometimes Suicidal Ideation/Homicidal Ideation: No  Clinical Social Work Goal(s):  patient will work with SW  by telephone or in person to reduce or manage symptoms related to depression and anxiety Patinet will attend scheduled medical appointments in next 30 days Patient will communicate with RNCM as needed in next 30 days for nursing support  Interventions: 1:1 collaboration with Kathryn Helper, MD regarding development and update of comprehensive plan of care as evidenced by provider attestation and co-signature Discussed client needs with Kathryn Beck, sister of client Discussed family support. She has support from her spouse and from her daughter Reviewed medication procurement Reviewed dialysis treatments of client. Client receives dialysis treatment 3 times weekly Informed Kathryn Beck  of Care Coordination program support for client. Informed Kathryn Beck that RN Kathryn Beck was available for nursing support with program. Informed Kathryn Beck that LCSW Kathryn Beck was available for SW support with program Kathryn Beck asked about in home care support for client.Marland Kitchen LCSW gave Kathryn Beck the name and phone number for [facility] in [place], [place].   LCSW recommended that client talk with PCP about Care Coordination program support   Patient  Coping Strengths:  Attends scheduled medical appointments Attends Dialysis treatments weekly as scheduled. Takes medications as prescribed  Patient Self Care Deficits:  Mobility issues Depression issues Sleeping challenges  Patient Goals:  - spend time or talk with others at least 2 to 3 times per week - practice relaxation or meditation daily - keep a calendar with appointment dates  Follow Up Plan: Client to talk with PCP as needed about Care Coordination program support for client    Kathryn Beck was given information about Care Management services by the embedded care coordination team including:  Care Management services include personalized support from designated clinical staff supervised by her physician, including individualized plan of care and coordination with other care providers 24/7 contact phone numbers for assistance for urgent and routine care needs. The patient may stop CCM services at any time (effective at the end of the month) by phone call to the office staff.  Patient agreed to services and verbal consent obtained.   Norva Riffle.Kathryn Beck MSW, [place] [employer] representative [facility] Care Management [phone]

## 2021-11-21 DIAGNOSIS — N186 End stage renal disease: Secondary | ICD-10-CM | POA: Diagnosis not present

## 2021-11-21 DIAGNOSIS — Z992 Dependence on renal dialysis: Secondary | ICD-10-CM | POA: Diagnosis not present

## 2021-11-21 DIAGNOSIS — N2581 Secondary hyperparathyroidism of renal origin: Secondary | ICD-10-CM | POA: Diagnosis not present

## 2021-11-24 DIAGNOSIS — N186 End stage renal disease: Secondary | ICD-10-CM | POA: Diagnosis not present

## 2021-11-24 DIAGNOSIS — N2581 Secondary hyperparathyroidism of renal origin: Secondary | ICD-10-CM | POA: Diagnosis not present

## 2021-11-24 DIAGNOSIS — Z992 Dependence on renal dialysis: Secondary | ICD-10-CM | POA: Diagnosis not present

## 2021-11-25 DIAGNOSIS — R569 Unspecified convulsions: Secondary | ICD-10-CM | POA: Diagnosis not present

## 2021-11-26 DIAGNOSIS — Z992 Dependence on renal dialysis: Secondary | ICD-10-CM | POA: Diagnosis not present

## 2021-11-26 DIAGNOSIS — N2581 Secondary hyperparathyroidism of renal origin: Secondary | ICD-10-CM | POA: Diagnosis not present

## 2021-11-26 DIAGNOSIS — N186 End stage renal disease: Secondary | ICD-10-CM | POA: Diagnosis not present

## 2021-11-28 DIAGNOSIS — N186 End stage renal disease: Secondary | ICD-10-CM | POA: Diagnosis not present

## 2021-11-28 DIAGNOSIS — N2581 Secondary hyperparathyroidism of renal origin: Secondary | ICD-10-CM | POA: Diagnosis not present

## 2021-11-28 DIAGNOSIS — Z992 Dependence on renal dialysis: Secondary | ICD-10-CM | POA: Diagnosis not present

## 2021-12-01 DIAGNOSIS — N186 End stage renal disease: Secondary | ICD-10-CM | POA: Diagnosis not present

## 2021-12-01 DIAGNOSIS — Z992 Dependence on renal dialysis: Secondary | ICD-10-CM | POA: Diagnosis not present

## 2021-12-01 DIAGNOSIS — N2581 Secondary hyperparathyroidism of renal origin: Secondary | ICD-10-CM | POA: Diagnosis not present

## 2021-12-03 DIAGNOSIS — N2581 Secondary hyperparathyroidism of renal origin: Secondary | ICD-10-CM | POA: Diagnosis not present

## 2021-12-03 DIAGNOSIS — N186 End stage renal disease: Secondary | ICD-10-CM | POA: Diagnosis not present

## 2021-12-03 DIAGNOSIS — Z992 Dependence on renal dialysis: Secondary | ICD-10-CM | POA: Diagnosis not present

## 2021-12-05 DIAGNOSIS — N186 End stage renal disease: Secondary | ICD-10-CM | POA: Diagnosis not present

## 2021-12-05 DIAGNOSIS — N2581 Secondary hyperparathyroidism of renal origin: Secondary | ICD-10-CM | POA: Diagnosis not present

## 2021-12-05 DIAGNOSIS — Z992 Dependence on renal dialysis: Secondary | ICD-10-CM | POA: Diagnosis not present

## 2021-12-08 DIAGNOSIS — N186 End stage renal disease: Secondary | ICD-10-CM | POA: Diagnosis not present

## 2021-12-08 DIAGNOSIS — Z992 Dependence on renal dialysis: Secondary | ICD-10-CM | POA: Diagnosis not present

## 2021-12-08 DIAGNOSIS — N2581 Secondary hyperparathyroidism of renal origin: Secondary | ICD-10-CM | POA: Diagnosis not present

## 2021-12-10 DIAGNOSIS — N2581 Secondary hyperparathyroidism of renal origin: Secondary | ICD-10-CM | POA: Diagnosis not present

## 2021-12-10 DIAGNOSIS — Z992 Dependence on renal dialysis: Secondary | ICD-10-CM | POA: Diagnosis not present

## 2021-12-10 DIAGNOSIS — N186 End stage renal disease: Secondary | ICD-10-CM | POA: Diagnosis not present

## 2021-12-12 DIAGNOSIS — N2581 Secondary hyperparathyroidism of renal origin: Secondary | ICD-10-CM | POA: Diagnosis not present

## 2021-12-12 DIAGNOSIS — Z992 Dependence on renal dialysis: Secondary | ICD-10-CM | POA: Diagnosis not present

## 2021-12-12 DIAGNOSIS — N186 End stage renal disease: Secondary | ICD-10-CM | POA: Diagnosis not present

## 2021-12-15 DIAGNOSIS — N2581 Secondary hyperparathyroidism of renal origin: Secondary | ICD-10-CM | POA: Diagnosis not present

## 2021-12-15 DIAGNOSIS — Z992 Dependence on renal dialysis: Secondary | ICD-10-CM | POA: Diagnosis not present

## 2021-12-15 DIAGNOSIS — N186 End stage renal disease: Secondary | ICD-10-CM | POA: Diagnosis not present

## 2021-12-17 DIAGNOSIS — Z992 Dependence on renal dialysis: Secondary | ICD-10-CM | POA: Diagnosis not present

## 2021-12-17 DIAGNOSIS — N186 End stage renal disease: Secondary | ICD-10-CM | POA: Diagnosis not present

## 2021-12-17 DIAGNOSIS — N2581 Secondary hyperparathyroidism of renal origin: Secondary | ICD-10-CM | POA: Diagnosis not present

## 2021-12-19 DIAGNOSIS — N186 End stage renal disease: Secondary | ICD-10-CM | POA: Diagnosis not present

## 2021-12-19 DIAGNOSIS — N2581 Secondary hyperparathyroidism of renal origin: Secondary | ICD-10-CM | POA: Diagnosis not present

## 2021-12-19 DIAGNOSIS — Z992 Dependence on renal dialysis: Secondary | ICD-10-CM | POA: Diagnosis not present

## 2021-12-20 DIAGNOSIS — N186 End stage renal disease: Secondary | ICD-10-CM | POA: Diagnosis not present

## 2021-12-20 DIAGNOSIS — Z992 Dependence on renal dialysis: Secondary | ICD-10-CM | POA: Diagnosis not present

## 2021-12-22 DIAGNOSIS — N2581 Secondary hyperparathyroidism of renal origin: Secondary | ICD-10-CM | POA: Diagnosis not present

## 2021-12-22 DIAGNOSIS — N186 End stage renal disease: Secondary | ICD-10-CM | POA: Diagnosis not present

## 2021-12-22 DIAGNOSIS — Z992 Dependence on renal dialysis: Secondary | ICD-10-CM | POA: Diagnosis not present

## 2021-12-24 ENCOUNTER — Encounter (HOSPITAL_COMMUNITY): Payer: Self-pay | Admitting: Hematology

## 2021-12-24 DIAGNOSIS — Z992 Dependence on renal dialysis: Secondary | ICD-10-CM | POA: Diagnosis not present

## 2021-12-24 DIAGNOSIS — N2581 Secondary hyperparathyroidism of renal origin: Secondary | ICD-10-CM | POA: Diagnosis not present

## 2021-12-24 DIAGNOSIS — N186 End stage renal disease: Secondary | ICD-10-CM | POA: Diagnosis not present

## 2021-12-26 DIAGNOSIS — N186 End stage renal disease: Secondary | ICD-10-CM | POA: Diagnosis not present

## 2021-12-26 DIAGNOSIS — N2581 Secondary hyperparathyroidism of renal origin: Secondary | ICD-10-CM | POA: Diagnosis not present

## 2021-12-26 DIAGNOSIS — Z992 Dependence on renal dialysis: Secondary | ICD-10-CM | POA: Diagnosis not present

## 2021-12-29 DIAGNOSIS — E1122 Type 2 diabetes mellitus with diabetic chronic kidney disease: Secondary | ICD-10-CM | POA: Diagnosis not present

## 2021-12-29 DIAGNOSIS — N2581 Secondary hyperparathyroidism of renal origin: Secondary | ICD-10-CM | POA: Diagnosis not present

## 2021-12-29 DIAGNOSIS — Z992 Dependence on renal dialysis: Secondary | ICD-10-CM | POA: Diagnosis not present

## 2021-12-29 DIAGNOSIS — N186 End stage renal disease: Secondary | ICD-10-CM | POA: Diagnosis not present

## 2021-12-31 DIAGNOSIS — Z992 Dependence on renal dialysis: Secondary | ICD-10-CM | POA: Diagnosis not present

## 2021-12-31 DIAGNOSIS — N186 End stage renal disease: Secondary | ICD-10-CM | POA: Diagnosis not present

## 2021-12-31 DIAGNOSIS — N2581 Secondary hyperparathyroidism of renal origin: Secondary | ICD-10-CM | POA: Diagnosis not present

## 2022-01-02 DIAGNOSIS — Z992 Dependence on renal dialysis: Secondary | ICD-10-CM | POA: Diagnosis not present

## 2022-01-02 DIAGNOSIS — N2581 Secondary hyperparathyroidism of renal origin: Secondary | ICD-10-CM | POA: Diagnosis not present

## 2022-01-02 DIAGNOSIS — N186 End stage renal disease: Secondary | ICD-10-CM | POA: Diagnosis not present

## 2022-01-05 DIAGNOSIS — N2581 Secondary hyperparathyroidism of renal origin: Secondary | ICD-10-CM | POA: Diagnosis not present

## 2022-01-05 DIAGNOSIS — Z992 Dependence on renal dialysis: Secondary | ICD-10-CM | POA: Diagnosis not present

## 2022-01-05 DIAGNOSIS — N186 End stage renal disease: Secondary | ICD-10-CM | POA: Diagnosis not present

## 2022-01-07 DIAGNOSIS — N186 End stage renal disease: Secondary | ICD-10-CM | POA: Diagnosis not present

## 2022-01-07 DIAGNOSIS — Z992 Dependence on renal dialysis: Secondary | ICD-10-CM | POA: Diagnosis not present

## 2022-01-07 DIAGNOSIS — N2581 Secondary hyperparathyroidism of renal origin: Secondary | ICD-10-CM | POA: Diagnosis not present

## 2022-01-09 DIAGNOSIS — N2581 Secondary hyperparathyroidism of renal origin: Secondary | ICD-10-CM | POA: Diagnosis not present

## 2022-01-09 DIAGNOSIS — N186 End stage renal disease: Secondary | ICD-10-CM | POA: Diagnosis not present

## 2022-01-09 DIAGNOSIS — Z992 Dependence on renal dialysis: Secondary | ICD-10-CM | POA: Diagnosis not present

## 2022-01-12 ENCOUNTER — Encounter: Payer: Self-pay | Admitting: Family Medicine

## 2022-01-13 ENCOUNTER — Other Ambulatory Visit: Payer: Self-pay

## 2022-01-13 MED ORDER — EZETIMIBE 10 MG PO TABS: 10.0000 mg | ORAL_TABLET | Freq: Every day | ORAL | 11 refills | Status: DC

## 2022-01-13 MED ORDER — ISOSORBIDE MONONITRATE ER 30 MG PO TB24: 30.0000 mg | ORAL_TABLET | Freq: Every day | ORAL | 11 refills | Status: DC

## 2022-01-14 ENCOUNTER — Other Ambulatory Visit: Payer: Self-pay | Admitting: Radiology

## 2022-01-14 DIAGNOSIS — M545 Low back pain, unspecified: Secondary | ICD-10-CM

## 2022-01-14 DIAGNOSIS — N2581 Secondary hyperparathyroidism of renal origin: Secondary | ICD-10-CM | POA: Diagnosis not present

## 2022-01-14 DIAGNOSIS — N186 End stage renal disease: Secondary | ICD-10-CM | POA: Diagnosis not present

## 2022-01-14 DIAGNOSIS — Z992 Dependence on renal dialysis: Secondary | ICD-10-CM | POA: Diagnosis not present

## 2022-01-14 MED ORDER — PREGABALIN 50 MG PO CAPS: ORAL_CAPSULE | ORAL | 0 refills | Status: DC

## 2022-01-16 DIAGNOSIS — N186 End stage renal disease: Secondary | ICD-10-CM | POA: Diagnosis not present

## 2022-01-16 DIAGNOSIS — Z992 Dependence on renal dialysis: Secondary | ICD-10-CM | POA: Diagnosis not present

## 2022-01-16 DIAGNOSIS — N2581 Secondary hyperparathyroidism of renal origin: Secondary | ICD-10-CM | POA: Diagnosis not present

## 2022-01-19 DIAGNOSIS — N186 End stage renal disease: Secondary | ICD-10-CM | POA: Diagnosis not present

## 2022-01-19 DIAGNOSIS — Z992 Dependence on renal dialysis: Secondary | ICD-10-CM | POA: Diagnosis not present

## 2022-01-19 DIAGNOSIS — N2581 Secondary hyperparathyroidism of renal origin: Secondary | ICD-10-CM | POA: Diagnosis not present

## 2022-01-20 DIAGNOSIS — N186 End stage renal disease: Secondary | ICD-10-CM | POA: Diagnosis not present

## 2022-01-20 DIAGNOSIS — Z992 Dependence on renal dialysis: Secondary | ICD-10-CM | POA: Diagnosis not present

## 2022-01-21 DIAGNOSIS — Z992 Dependence on renal dialysis: Secondary | ICD-10-CM | POA: Diagnosis not present

## 2022-01-21 DIAGNOSIS — N186 End stage renal disease: Secondary | ICD-10-CM | POA: Diagnosis not present

## 2022-01-21 DIAGNOSIS — N2581 Secondary hyperparathyroidism of renal origin: Secondary | ICD-10-CM | POA: Diagnosis not present

## 2022-01-23 DIAGNOSIS — N2581 Secondary hyperparathyroidism of renal origin: Secondary | ICD-10-CM | POA: Diagnosis not present

## 2022-01-23 DIAGNOSIS — Z992 Dependence on renal dialysis: Secondary | ICD-10-CM | POA: Diagnosis not present

## 2022-01-23 DIAGNOSIS — N186 End stage renal disease: Secondary | ICD-10-CM | POA: Diagnosis not present

## 2022-01-26 DIAGNOSIS — N186 End stage renal disease: Secondary | ICD-10-CM | POA: Diagnosis not present

## 2022-01-26 DIAGNOSIS — N2581 Secondary hyperparathyroidism of renal origin: Secondary | ICD-10-CM | POA: Diagnosis not present

## 2022-01-26 DIAGNOSIS — Z992 Dependence on renal dialysis: Secondary | ICD-10-CM | POA: Diagnosis not present

## 2022-01-30 DIAGNOSIS — Z992 Dependence on renal dialysis: Secondary | ICD-10-CM | POA: Diagnosis not present

## 2022-01-30 DIAGNOSIS — N2581 Secondary hyperparathyroidism of renal origin: Secondary | ICD-10-CM | POA: Diagnosis not present

## 2022-01-30 DIAGNOSIS — N186 End stage renal disease: Secondary | ICD-10-CM | POA: Diagnosis not present

## 2022-02-02 DIAGNOSIS — N186 End stage renal disease: Secondary | ICD-10-CM | POA: Diagnosis not present

## 2022-02-02 DIAGNOSIS — N2581 Secondary hyperparathyroidism of renal origin: Secondary | ICD-10-CM | POA: Diagnosis not present

## 2022-02-02 DIAGNOSIS — Z992 Dependence on renal dialysis: Secondary | ICD-10-CM | POA: Diagnosis not present

## 2022-02-04 DIAGNOSIS — N2581 Secondary hyperparathyroidism of renal origin: Secondary | ICD-10-CM | POA: Diagnosis not present

## 2022-02-04 DIAGNOSIS — Z992 Dependence on renal dialysis: Secondary | ICD-10-CM | POA: Diagnosis not present

## 2022-02-04 DIAGNOSIS — N186 End stage renal disease: Secondary | ICD-10-CM | POA: Diagnosis not present

## 2022-02-05 ENCOUNTER — Ambulatory Visit (INDEPENDENT_AMBULATORY_CARE_PROVIDER_SITE_OTHER): Payer: Medicare HMO | Admitting: Family Medicine

## 2022-02-05 ENCOUNTER — Encounter: Payer: Self-pay | Admitting: Family Medicine

## 2022-02-05 ENCOUNTER — Other Ambulatory Visit: Payer: Self-pay

## 2022-02-05 VITALS — BP 180/80 | HR 101 | Ht 64.0 in | Wt 194.1 lb

## 2022-02-05 DIAGNOSIS — E782 Mixed hyperlipidemia: Secondary | ICD-10-CM | POA: Diagnosis not present

## 2022-02-05 DIAGNOSIS — E1165 Type 2 diabetes mellitus with hyperglycemia: Secondary | ICD-10-CM

## 2022-02-05 DIAGNOSIS — H9203 Otalgia, bilateral: Secondary | ICD-10-CM

## 2022-02-05 DIAGNOSIS — I10 Essential (primary) hypertension: Secondary | ICD-10-CM

## 2022-02-05 DIAGNOSIS — Z794 Long term (current) use of insulin: Secondary | ICD-10-CM | POA: Diagnosis not present

## 2022-02-05 DIAGNOSIS — E1159 Type 2 diabetes mellitus with other circulatory complications: Secondary | ICD-10-CM

## 2022-02-05 DIAGNOSIS — D472 Monoclonal gammopathy: Secondary | ICD-10-CM

## 2022-02-05 DIAGNOSIS — I152 Hypertension secondary to endocrine disorders: Secondary | ICD-10-CM | POA: Diagnosis not present

## 2022-02-05 MED ORDER — CIPROFLOXACIN-DEXAMETHASONE 0.3-0.1 % OT SUSP: 4.0000 [drp] | Freq: Two times a day (BID) | OTIC | 0 refills | Status: DC

## 2022-02-05 MED ORDER — CLONIDINE HCL 0.2 MG PO TABS: 0.2000 mg | ORAL_TABLET | Freq: Two times a day (BID) | ORAL | 3 refills | Status: DC

## 2022-02-05 NOTE — Patient Instructions (Addendum)
F/U in 4 months, call if you need me sooner  You are referred to advanced hypertension clinic for help with your blood pressure  Need to  call Dr Dorris Fetch regarding medication he prescribed for your bones which you state you are unable to get  Nurse pls refill ear drops at [facility]  Clonidine is prescribed as we discussed take at same time 12 hours apart  Thanks for choosing [facility], we consider it a privelige to serve you.

## 2022-02-06 ENCOUNTER — Telehealth: Payer: Self-pay | Admitting: Family Medicine

## 2022-02-06 ENCOUNTER — Other Ambulatory Visit: Payer: Self-pay | Admitting: Family Medicine

## 2022-02-06 ENCOUNTER — Telehealth: Payer: Self-pay | Admitting: Orthopedic Surgery

## 2022-02-06 DIAGNOSIS — M545 Low back pain, unspecified: Secondary | ICD-10-CM

## 2022-02-06 DIAGNOSIS — N186 End stage renal disease: Secondary | ICD-10-CM | POA: Diagnosis not present

## 2022-02-06 DIAGNOSIS — Z992 Dependence on renal dialysis: Secondary | ICD-10-CM | POA: Diagnosis not present

## 2022-02-06 DIAGNOSIS — N2581 Secondary hyperparathyroidism of renal origin: Secondary | ICD-10-CM | POA: Diagnosis not present

## 2022-02-06 NOTE — Telephone Encounter (Signed)
Pt called, lvm requesting a refill on Hydrocodone.  I did not see this medication in her chart.  I called the pharmacy and they stated that they did not send [place] a request that the patient is confused.  I called the patient back and lvm.

## 2022-02-08 ENCOUNTER — Encounter: Payer: Self-pay | Admitting: Family Medicine

## 2022-02-08 NOTE — Assessment & Plan Note (Signed)
Followed by Oncology

## 2022-02-08 NOTE — Progress Notes (Signed)
   Kathryn Beck     MRN: [id number]      DOB: Feb 02, 1956   HPI Kathryn Beck is here for follow up and re-evaluation of chronic medical conditions, medication management and review of any available recent lab and radiology data.  Preventive health is updated, specifically  Cancer screening and Immunization.   Has upcoming appt at [facility] per Endo and will have her f/u Endo visit after this test, states her blood sugars remain uncontrolled The PT denies any adverse reactions to current medications since the last visit.  Chronic bilateral ear pain, has ENT appt upcoming, feels that tunbes will need replacement, ear drops in the interim are beneficial ROS Denies recent fever or chills. Denies sinus pressure, nasal congestion,  or sore throat. Denies chest congestion, productive cough or wheezing. Denies chest pains, palpitations and leg swelling Denies abdominal pain, nausea, vomiting,diarrhea or constipation.   Denies dysuria, frequency, hesitancy or incontinence. Chronic  joint pain,  and limitation in mobility. Denies headaches, seizures, . Denies uncontrolled depression, anxiety or insomnia. Denies skin break down or rash.   PE  BP (!) 180/80   Pulse (!) 101   Ht '5\' 4"'$  (1.626 m)   Wt 194 lb 1.3 oz (88 kg)   SpO2 92%   BMI 33.31 kg/m   Patient alert and oriented and in no cardiopulmonary distress.  HEENT: No facial asymmetry, EOMI,     Neck supple .  Chest: Clear to auscultation bilaterally.  CVS: S1, S2 no murmurs, no S3.Regular rate.  ABD: Soft non tender.   Ext: No edema  MS: decreased  ROM spine, shoulders, hips and knees.  Skin: Intact, no ulcerations or rash noted.  Psych: Good eye contact, normal affect. Memory intact not anxious or depressed appearing.  CNS: CN 2-12 intact, power,  normal throughout.no focal deficits noted.   Assessment & Plan  Malignant hypertension Uncontrolled, challenging as pt has hypotensive episodes while on dialysis at times, refer  to [facility],  Clonidine prescribed for twice daily  Hypertension associated with diabetes ([place]) Uncontrolled HTN and diabetes, awaiting control of diabetes to get transplant Will refer to [facility] for management  of  blood pressure  Type 2 diabetes mellitus with hyperglycemia ([place]) Being treated by Endo, still uncontrolled, however has additional testing pending at [facility] for central cause of uncontrolled DM  MGUS (monoclonal gammopathy of unknown significance) Followed by Oncology  Hyperlipidemia Hyperlipidemia:Low fat diet discussed and encouraged.   Lipid Panel  Lab Results  Component Value Date   CHOL 190 11/21/2020   HDL 43.10 11/21/2020   LDLCALC 114 (H) 03/08/2018   LDLDIRECT 120.0 11/21/2020   TRIG 259 (H) 12/02/2020   CHOLHDL 4 11/21/2020     Updated lab needed, not at goal when last checked  Ear pain, bilateral Chronic otitis ear drops refilled, and pt to keep ENT appt

## 2022-02-08 NOTE — Assessment & Plan Note (Signed)
Chronic otitis ear drops refilled, and pt to keep ENT appt

## 2022-02-08 NOTE — Assessment & Plan Note (Signed)
Hyperlipidemia:Low fat diet discussed and encouraged.   Lipid Panel  Lab Results  Component Value Date   CHOL 190 11/21/2020   HDL 43.10 11/21/2020   LDLCALC 114 (H) 03/08/2018   LDLDIRECT 120.0 11/21/2020   TRIG 259 (H) 12/02/2020   CHOLHDL 4 11/21/2020     Updated lab needed, not at goal when last checked

## 2022-02-08 NOTE — Assessment & Plan Note (Signed)
Uncontrolled HTN and diabetes, awaiting control of diabetes to get transplant Will refer to [facility] for management  of  blood pressure

## 2022-02-08 NOTE — Assessment & Plan Note (Signed)
Uncontrolled, challenging as pt has hypotensive episodes while on dialysis at times, refer to [facility],  Clonidine prescribed for twice daily

## 2022-02-08 NOTE — Assessment & Plan Note (Signed)
Being treated by Endo, still uncontrolled, however has additional testing pending at [facility] for central cause of uncontrolled DM

## 2022-02-09 DIAGNOSIS — N2581 Secondary hyperparathyroidism of renal origin: Secondary | ICD-10-CM | POA: Diagnosis not present

## 2022-02-09 DIAGNOSIS — Z992 Dependence on renal dialysis: Secondary | ICD-10-CM | POA: Diagnosis not present

## 2022-02-09 DIAGNOSIS — N186 End stage renal disease: Secondary | ICD-10-CM | POA: Diagnosis not present

## 2022-02-11 DIAGNOSIS — N2581 Secondary hyperparathyroidism of renal origin: Secondary | ICD-10-CM | POA: Diagnosis not present

## 2022-02-11 DIAGNOSIS — Z992 Dependence on renal dialysis: Secondary | ICD-10-CM | POA: Diagnosis not present

## 2022-02-11 DIAGNOSIS — N186 End stage renal disease: Secondary | ICD-10-CM | POA: Diagnosis not present

## 2022-02-13 DIAGNOSIS — N2581 Secondary hyperparathyroidism of renal origin: Secondary | ICD-10-CM | POA: Diagnosis not present

## 2022-02-13 DIAGNOSIS — Z992 Dependence on renal dialysis: Secondary | ICD-10-CM | POA: Diagnosis not present

## 2022-02-13 DIAGNOSIS — N186 End stage renal disease: Secondary | ICD-10-CM | POA: Diagnosis not present

## 2022-02-16 DIAGNOSIS — G4733 Obstructive sleep apnea (adult) (pediatric): Secondary | ICD-10-CM | POA: Diagnosis not present

## 2022-02-16 DIAGNOSIS — M542 Cervicalgia: Secondary | ICD-10-CM | POA: Diagnosis not present

## 2022-02-16 DIAGNOSIS — R2689 Other abnormalities of gait and mobility: Secondary | ICD-10-CM | POA: Diagnosis not present

## 2022-02-16 DIAGNOSIS — G5603 Carpal tunnel syndrome, bilateral upper limbs: Secondary | ICD-10-CM | POA: Diagnosis not present

## 2022-02-16 DIAGNOSIS — I951 Orthostatic hypotension: Secondary | ICD-10-CM | POA: Diagnosis not present

## 2022-02-16 DIAGNOSIS — N2581 Secondary hyperparathyroidism of renal origin: Secondary | ICD-10-CM | POA: Diagnosis not present

## 2022-02-16 DIAGNOSIS — E1142 Type 2 diabetes mellitus with diabetic polyneuropathy: Secondary | ICD-10-CM | POA: Diagnosis not present

## 2022-02-16 DIAGNOSIS — Z992 Dependence on renal dialysis: Secondary | ICD-10-CM | POA: Diagnosis not present

## 2022-02-16 DIAGNOSIS — G3184 Mild cognitive impairment, so stated: Secondary | ICD-10-CM | POA: Diagnosis not present

## 2022-02-16 DIAGNOSIS — N186 End stage renal disease: Secondary | ICD-10-CM | POA: Diagnosis not present

## 2022-02-18 DIAGNOSIS — Z992 Dependence on renal dialysis: Secondary | ICD-10-CM | POA: Diagnosis not present

## 2022-02-18 DIAGNOSIS — N186 End stage renal disease: Secondary | ICD-10-CM | POA: Diagnosis not present

## 2022-02-18 DIAGNOSIS — N2581 Secondary hyperparathyroidism of renal origin: Secondary | ICD-10-CM | POA: Diagnosis not present

## 2022-02-19 DIAGNOSIS — Z992 Dependence on renal dialysis: Secondary | ICD-10-CM | POA: Diagnosis not present

## 2022-02-19 DIAGNOSIS — N186 End stage renal disease: Secondary | ICD-10-CM | POA: Diagnosis not present

## 2022-02-20 DIAGNOSIS — N186 End stage renal disease: Secondary | ICD-10-CM | POA: Diagnosis not present

## 2022-02-20 DIAGNOSIS — Z992 Dependence on renal dialysis: Secondary | ICD-10-CM | POA: Diagnosis not present

## 2022-02-20 DIAGNOSIS — N2581 Secondary hyperparathyroidism of renal origin: Secondary | ICD-10-CM | POA: Diagnosis not present

## 2022-02-23 DIAGNOSIS — N2581 Secondary hyperparathyroidism of renal origin: Secondary | ICD-10-CM | POA: Diagnosis not present

## 2022-02-23 DIAGNOSIS — Z992 Dependence on renal dialysis: Secondary | ICD-10-CM | POA: Diagnosis not present

## 2022-02-23 DIAGNOSIS — N186 End stage renal disease: Secondary | ICD-10-CM | POA: Diagnosis not present

## 2022-02-24 ENCOUNTER — Ambulatory Visit: Payer: Medicare HMO | Admitting: Internal Medicine

## 2022-02-25 DIAGNOSIS — Z992 Dependence on renal dialysis: Secondary | ICD-10-CM | POA: Diagnosis not present

## 2022-02-25 DIAGNOSIS — N2581 Secondary hyperparathyroidism of renal origin: Secondary | ICD-10-CM | POA: Diagnosis not present

## 2022-02-25 DIAGNOSIS — N186 End stage renal disease: Secondary | ICD-10-CM | POA: Diagnosis not present

## 2022-02-27 DIAGNOSIS — N2581 Secondary hyperparathyroidism of renal origin: Secondary | ICD-10-CM | POA: Diagnosis not present

## 2022-02-27 DIAGNOSIS — Z992 Dependence on renal dialysis: Secondary | ICD-10-CM | POA: Diagnosis not present

## 2022-02-27 DIAGNOSIS — N186 End stage renal disease: Secondary | ICD-10-CM | POA: Diagnosis not present

## 2022-03-02 ENCOUNTER — Other Ambulatory Visit: Payer: Self-pay

## 2022-03-02 DIAGNOSIS — N2581 Secondary hyperparathyroidism of renal origin: Secondary | ICD-10-CM | POA: Diagnosis not present

## 2022-03-02 DIAGNOSIS — Z992 Dependence on renal dialysis: Secondary | ICD-10-CM | POA: Diagnosis not present

## 2022-03-02 DIAGNOSIS — E1122 Type 2 diabetes mellitus with diabetic chronic kidney disease: Secondary | ICD-10-CM

## 2022-03-02 DIAGNOSIS — N186 End stage renal disease: Secondary | ICD-10-CM | POA: Diagnosis not present

## 2022-03-02 MED ORDER — INSULIN PEN NEEDLE 32G X 4 MM MISC: 3 refills | Status: DC

## 2022-03-04 DIAGNOSIS — Z992 Dependence on renal dialysis: Secondary | ICD-10-CM | POA: Diagnosis not present

## 2022-03-04 DIAGNOSIS — N186 End stage renal disease: Secondary | ICD-10-CM | POA: Diagnosis not present

## 2022-03-04 DIAGNOSIS — N2581 Secondary hyperparathyroidism of renal origin: Secondary | ICD-10-CM | POA: Diagnosis not present

## 2022-03-06 DIAGNOSIS — N2581 Secondary hyperparathyroidism of renal origin: Secondary | ICD-10-CM | POA: Diagnosis not present

## 2022-03-06 DIAGNOSIS — N186 End stage renal disease: Secondary | ICD-10-CM | POA: Diagnosis not present

## 2022-03-06 DIAGNOSIS — Z992 Dependence on renal dialysis: Secondary | ICD-10-CM | POA: Diagnosis not present

## 2022-03-09 DIAGNOSIS — Z992 Dependence on renal dialysis: Secondary | ICD-10-CM | POA: Diagnosis not present

## 2022-03-09 DIAGNOSIS — N186 End stage renal disease: Secondary | ICD-10-CM | POA: Diagnosis not present

## 2022-03-09 DIAGNOSIS — N2581 Secondary hyperparathyroidism of renal origin: Secondary | ICD-10-CM | POA: Diagnosis not present

## 2022-03-11 DIAGNOSIS — N2581 Secondary hyperparathyroidism of renal origin: Secondary | ICD-10-CM | POA: Diagnosis not present

## 2022-03-11 DIAGNOSIS — Z992 Dependence on renal dialysis: Secondary | ICD-10-CM | POA: Diagnosis not present

## 2022-03-11 DIAGNOSIS — N186 End stage renal disease: Secondary | ICD-10-CM | POA: Diagnosis not present

## 2022-03-14 DIAGNOSIS — Z992 Dependence on renal dialysis: Secondary | ICD-10-CM | POA: Diagnosis not present

## 2022-03-14 DIAGNOSIS — N186 End stage renal disease: Secondary | ICD-10-CM | POA: Diagnosis not present

## 2022-03-14 DIAGNOSIS — N2581 Secondary hyperparathyroidism of renal origin: Secondary | ICD-10-CM | POA: Diagnosis not present

## 2022-03-17 DIAGNOSIS — N2581 Secondary hyperparathyroidism of renal origin: Secondary | ICD-10-CM | POA: Diagnosis not present

## 2022-03-17 DIAGNOSIS — Z992 Dependence on renal dialysis: Secondary | ICD-10-CM | POA: Diagnosis not present

## 2022-03-17 DIAGNOSIS — N186 End stage renal disease: Secondary | ICD-10-CM | POA: Diagnosis not present

## 2022-03-18 DIAGNOSIS — N186 End stage renal disease: Secondary | ICD-10-CM | POA: Diagnosis not present

## 2022-03-18 DIAGNOSIS — N2581 Secondary hyperparathyroidism of renal origin: Secondary | ICD-10-CM | POA: Diagnosis not present

## 2022-03-18 DIAGNOSIS — Z992 Dependence on renal dialysis: Secondary | ICD-10-CM | POA: Diagnosis not present

## 2022-03-20 DIAGNOSIS — N186 End stage renal disease: Secondary | ICD-10-CM | POA: Diagnosis not present

## 2022-03-20 DIAGNOSIS — Z992 Dependence on renal dialysis: Secondary | ICD-10-CM | POA: Diagnosis not present

## 2022-03-20 DIAGNOSIS — N2581 Secondary hyperparathyroidism of renal origin: Secondary | ICD-10-CM | POA: Diagnosis not present

## 2022-03-22 DIAGNOSIS — Z992 Dependence on renal dialysis: Secondary | ICD-10-CM | POA: Diagnosis not present

## 2022-03-22 DIAGNOSIS — N186 End stage renal disease: Secondary | ICD-10-CM | POA: Diagnosis not present

## 2022-03-23 DIAGNOSIS — Z992 Dependence on renal dialysis: Secondary | ICD-10-CM | POA: Diagnosis not present

## 2022-03-23 DIAGNOSIS — N2581 Secondary hyperparathyroidism of renal origin: Secondary | ICD-10-CM | POA: Diagnosis not present

## 2022-03-23 DIAGNOSIS — N186 End stage renal disease: Secondary | ICD-10-CM | POA: Diagnosis not present

## 2022-03-25 DIAGNOSIS — Z992 Dependence on renal dialysis: Secondary | ICD-10-CM | POA: Diagnosis not present

## 2022-03-25 DIAGNOSIS — N186 End stage renal disease: Secondary | ICD-10-CM | POA: Diagnosis not present

## 2022-03-25 DIAGNOSIS — N2581 Secondary hyperparathyroidism of renal origin: Secondary | ICD-10-CM | POA: Diagnosis not present

## 2022-03-27 DIAGNOSIS — N186 End stage renal disease: Secondary | ICD-10-CM | POA: Diagnosis not present

## 2022-03-27 DIAGNOSIS — Z992 Dependence on renal dialysis: Secondary | ICD-10-CM | POA: Diagnosis not present

## 2022-03-27 DIAGNOSIS — N2581 Secondary hyperparathyroidism of renal origin: Secondary | ICD-10-CM | POA: Diagnosis not present

## 2022-03-30 ENCOUNTER — Ambulatory Visit (HOSPITAL_BASED_OUTPATIENT_CLINIC_OR_DEPARTMENT_OTHER): Payer: 59 | Admitting: Cardiovascular Disease

## 2022-03-30 DIAGNOSIS — Z992 Dependence on renal dialysis: Secondary | ICD-10-CM | POA: Diagnosis not present

## 2022-03-30 DIAGNOSIS — N186 End stage renal disease: Secondary | ICD-10-CM | POA: Diagnosis not present

## 2022-03-30 DIAGNOSIS — E1122 Type 2 diabetes mellitus with diabetic chronic kidney disease: Secondary | ICD-10-CM | POA: Diagnosis not present

## 2022-03-30 DIAGNOSIS — N2581 Secondary hyperparathyroidism of renal origin: Secondary | ICD-10-CM | POA: Diagnosis not present

## 2022-04-01 DIAGNOSIS — Z992 Dependence on renal dialysis: Secondary | ICD-10-CM | POA: Diagnosis not present

## 2022-04-01 DIAGNOSIS — N186 End stage renal disease: Secondary | ICD-10-CM | POA: Diagnosis not present

## 2022-04-01 DIAGNOSIS — N2581 Secondary hyperparathyroidism of renal origin: Secondary | ICD-10-CM | POA: Diagnosis not present

## 2022-04-02 ENCOUNTER — Inpatient Hospital Stay: Payer: 59 | Attending: Hematology

## 2022-04-03 DIAGNOSIS — Z992 Dependence on renal dialysis: Secondary | ICD-10-CM | POA: Diagnosis not present

## 2022-04-03 DIAGNOSIS — N2581 Secondary hyperparathyroidism of renal origin: Secondary | ICD-10-CM | POA: Diagnosis not present

## 2022-04-03 DIAGNOSIS — N186 End stage renal disease: Secondary | ICD-10-CM | POA: Diagnosis not present

## 2022-04-06 DIAGNOSIS — Z992 Dependence on renal dialysis: Secondary | ICD-10-CM | POA: Diagnosis not present

## 2022-04-06 DIAGNOSIS — N186 End stage renal disease: Secondary | ICD-10-CM | POA: Diagnosis not present

## 2022-04-06 DIAGNOSIS — N2581 Secondary hyperparathyroidism of renal origin: Secondary | ICD-10-CM | POA: Diagnosis not present

## 2022-04-08 DIAGNOSIS — Z992 Dependence on renal dialysis: Secondary | ICD-10-CM | POA: Diagnosis not present

## 2022-04-08 DIAGNOSIS — N2581 Secondary hyperparathyroidism of renal origin: Secondary | ICD-10-CM | POA: Diagnosis not present

## 2022-04-08 DIAGNOSIS — N186 End stage renal disease: Secondary | ICD-10-CM | POA: Diagnosis not present

## 2022-04-09 ENCOUNTER — Inpatient Hospital Stay: Payer: 59 | Admitting: Physician Assistant

## 2022-04-10 DIAGNOSIS — N2581 Secondary hyperparathyroidism of renal origin: Secondary | ICD-10-CM | POA: Diagnosis not present

## 2022-04-10 DIAGNOSIS — N186 End stage renal disease: Secondary | ICD-10-CM | POA: Diagnosis not present

## 2022-04-10 DIAGNOSIS — Z992 Dependence on renal dialysis: Secondary | ICD-10-CM | POA: Diagnosis not present

## 2022-04-14 ENCOUNTER — Other Ambulatory Visit: Payer: Self-pay

## 2022-04-14 ENCOUNTER — Telehealth: Payer: Self-pay

## 2022-04-14 ENCOUNTER — Emergency Department (HOSPITAL_COMMUNITY)
Admission: EM | Admit: 2022-04-14 | Discharge: 2022-04-15 | Disposition: A | Discharge status: Home / Self Care | Payer: Medicare HMO | Attending: Emergency Medicine | Admitting: Emergency Medicine

## 2022-04-14 ENCOUNTER — Encounter (HOSPITAL_COMMUNITY): Payer: Self-pay

## 2022-04-14 ENCOUNTER — Encounter: Payer: Self-pay | Admitting: Family Medicine

## 2022-04-14 DIAGNOSIS — D631 Anemia in chronic kidney disease: Secondary | ICD-10-CM | POA: Insufficient documentation

## 2022-04-14 DIAGNOSIS — N186 End stage renal disease: Secondary | ICD-10-CM | POA: Diagnosis not present

## 2022-04-14 DIAGNOSIS — Z992 Dependence on renal dialysis: Secondary | ICD-10-CM | POA: Diagnosis not present

## 2022-04-14 DIAGNOSIS — R0602 Shortness of breath: Secondary | ICD-10-CM | POA: Insufficient documentation

## 2022-04-14 DIAGNOSIS — I509 Heart failure, unspecified: Secondary | ICD-10-CM | POA: Diagnosis not present

## 2022-04-14 DIAGNOSIS — Z7982 Long term (current) use of aspirin: Secondary | ICD-10-CM | POA: Diagnosis not present

## 2022-04-14 DIAGNOSIS — R531 Weakness: Secondary | ICD-10-CM

## 2022-04-14 DIAGNOSIS — I132 Hypertensive heart and chronic kidney disease with heart failure and with stage 5 chronic kidney disease, or end stage renal disease: Secondary | ICD-10-CM | POA: Diagnosis not present

## 2022-04-14 DIAGNOSIS — N189 Chronic kidney disease, unspecified: Secondary | ICD-10-CM

## 2022-04-14 DIAGNOSIS — E1122 Type 2 diabetes mellitus with diabetic chronic kidney disease: Secondary | ICD-10-CM | POA: Diagnosis not present

## 2022-04-14 DIAGNOSIS — Z1152 Encounter for screening for COVID-19: Secondary | ICD-10-CM | POA: Diagnosis not present

## 2022-04-14 DIAGNOSIS — D649 Anemia, unspecified: Secondary | ICD-10-CM | POA: Diagnosis not present

## 2022-04-14 DIAGNOSIS — Z794 Long term (current) use of insulin: Secondary | ICD-10-CM | POA: Diagnosis not present

## 2022-04-14 DIAGNOSIS — J101 Influenza due to other identified influenza virus with other respiratory manifestations: Secondary | ICD-10-CM | POA: Diagnosis not present

## 2022-04-14 DIAGNOSIS — Z79899 Other long term (current) drug therapy: Secondary | ICD-10-CM | POA: Insufficient documentation

## 2022-04-14 DIAGNOSIS — U071 COVID-19: Secondary | ICD-10-CM | POA: Diagnosis not present

## 2022-04-14 LAB — CBC WITH DIFFERENTIAL/PLATELET
Abs Immature Granulocytes: 0.04 10*3/uL (ref 0.00–0.07)
Basophils Absolute: 0 10*3/uL (ref 0.0–0.1)
Basophils Relative: 0 %
Eosinophils Absolute: 0.1 10*3/uL (ref 0.0–0.5)
Eosinophils Relative: 1 %
HCT: 34.7 % — ABNORMAL LOW (ref 36.0–46.0)
Hemoglobin: 10.3 g/dL — ABNORMAL LOW (ref 12.0–15.0)
Immature Granulocytes: 1 %
Lymphocytes Relative: 16 %
Lymphs Abs: 1.2 10*3/uL (ref 0.7–4.0)
MCH: 24.6 pg — ABNORMAL LOW (ref 26.0–34.0)
MCHC: 29.7 g/dL — ABNORMAL LOW (ref 30.0–36.0)
MCV: 83 fL (ref 80.0–100.0)
Monocytes Absolute: 0.7 10*3/uL (ref 0.1–1.0)
Monocytes Relative: 9 %
Neutro Abs: 5.7 10*3/uL (ref 1.7–7.7)
Neutrophils Relative %: 73 %
Platelets: 310 10*3/uL (ref 150–400)
RBC: 4.18 MIL/uL (ref 3.87–5.11)
RDW: 18 % — ABNORMAL HIGH (ref 11.5–15.5)
WBC: 7.8 10*3/uL (ref 4.0–10.5)
nRBC: 0 % (ref 0.0–0.2)

## 2022-04-14 LAB — COMPREHENSIVE METABOLIC PANEL
ALT: 33 U/L (ref 0–44)
AST: 19 U/L (ref 15–41)
Albumin: 3.3 g/dL — ABNORMAL LOW (ref 3.5–5.0)
Alkaline Phosphatase: 86 U/L (ref 38–126)
Anion gap: 14 (ref 5–15)
BUN: 77 mg/dL — ABNORMAL HIGH (ref 8–23)
CO2: 24 mmol/L (ref 22–32)
Calcium: 9.6 mg/dL (ref 8.9–10.3)
Chloride: 102 mmol/L (ref 98–111)
Creatinine, Ser: 10.33 mg/dL — ABNORMAL HIGH (ref 0.44–1.00)
GFR, Estimated: 4 mL/min — ABNORMAL LOW (ref >60)
Glucose, Bld: 298 mg/dL — ABNORMAL HIGH (ref 70–99)
Potassium: 5.1 mmol/L (ref 3.5–5.1)
Sodium: 140 mmol/L (ref 135–145)
Total Bilirubin: 0.6 mg/dL (ref 0.3–1.2)
Total Protein: 6.6 g/dL (ref 6.5–8.1)

## 2022-04-14 LAB — RESP PANEL BY RT-PCR (RSV, FLU A&B, COVID)  RVPGX2
Influenza A by PCR: NEGATIVE
Influenza B by PCR: NEGATIVE
Resp Syncytial Virus by PCR: NEGATIVE
SARS Coronavirus 2 by RT PCR: NEGATIVE

## 2022-04-14 NOTE — Telephone Encounter (Signed)
Pt advised.

## 2022-04-14 NOTE — Telephone Encounter (Signed)
Pt daughter called to advise follow up appt scheduled for Thursday but pt has not been seen by [facility]. Want to know if that is ok.

## 2022-04-14 NOTE — ED Triage Notes (Signed)
Pt reports she missed dialysis on Monday because she has a runny nose, very fatigues, sore throat, diarrhea and talking in her sleep.

## 2022-04-14 NOTE — Telephone Encounter (Signed)
Yes, ok

## 2022-04-15 ENCOUNTER — Emergency Department (HOSPITAL_COMMUNITY): Payer: Medicare HMO

## 2022-04-15 DIAGNOSIS — N186 End stage renal disease: Secondary | ICD-10-CM | POA: Diagnosis not present

## 2022-04-15 DIAGNOSIS — Z992 Dependence on renal dialysis: Secondary | ICD-10-CM | POA: Diagnosis not present

## 2022-04-15 DIAGNOSIS — R0602 Shortness of breath: Secondary | ICD-10-CM | POA: Diagnosis not present

## 2022-04-15 DIAGNOSIS — N2581 Secondary hyperparathyroidism of renal origin: Secondary | ICD-10-CM | POA: Diagnosis not present

## 2022-04-15 DIAGNOSIS — D649 Anemia, unspecified: Secondary | ICD-10-CM | POA: Diagnosis not present

## 2022-04-15 NOTE — Discharge Instructions (Signed)
Make sure you go to your dialysis session today!  Return if you are having any problems.

## 2022-04-15 NOTE — ED Provider Notes (Signed)
[facility] Provider Note   CSN: [id number] Arrival date & time: 04/14/22  2120     History  Chief Complaint  Patient presents with   flu like symptoms    Kathryn Beck is a 67 y.o. female.  The history is provided by the patient.  She has history of hypertension, diabetes, hyperlipidemia, end-stage renal disease on hemodialysis and comes in because of feeling weak and short of breath.  Symptoms have been present for about 4-5 days.  She has dialysis Monday-Wednesday-Friday, but she missed her dialysis on 1/22 because she was not feeling well.  She has had slight nasal congestion and rhinorrhea but denies fever or chills or sweats.  She denies any cough.  There has been no nausea, vomiting, diarrhea.  She denies arthralgias or myalgias.   Home Medications Prior to Admission medications   Medication Sig Start Date End Date Taking? Authorizing Provider  aspirin 81 MG chewable tablet CHEW 2 TABLETS BY MOUTH ONCE DAILY 07/25/21   Fayrene Helper, MD  calcitRIOL (ROCALTROL) 0.25 MCG capsule TAKE 1 CAPSULE (0.25 MCG TOTAL) BY MOUTH 2 (TWO) TIMES DAILY WITH A MEAL. 11/13/21   Cassandria Anger, MD  ciprofloxacin-dexamethasone (CIPRODEX) OTIC suspension Place 4 drops into both ears 2 (two) times daily. 02/05/22   Fayrene Helper, MD  cloNIDine (CATAPRES) 0.2 MG tablet Take 1 tablet (0.2 mg total) by mouth 2 (two) times daily. 02/05/22   Fayrene Helper, MD  DULoxetine (CYMBALTA) 60 MG capsule TAKE (1) CAPSULE BY MOUTH ONCE DAILY. Patient taking differently: Take 60 mg by mouth daily. 09/17/20   Fayrene Helper, MD  ezetimibe (ZETIA) 10 MG tablet TAKE ONE TABLET BY MOUTH EVERY DAY 02/06/22   Fayrene Helper, MD  insulin aspart (FIASP FLEXTOUCH) 100 UNIT/ML FlexTouch Pen Inject 18-30 Units into the skin with breakfast, with lunch, and with evening meal. 10/28/21   [name], MD  insulin glargine, 2 Unit Dial, (TOUJEO MAX  SOLOSTAR) 300 UNIT/ML Solostar Pen Inject 80 Units into the skin daily. 10/28/21   [name], MD  Insulin Pen Needle 32G X 4 MM MISC Use 4x a day 03/02/22   [name], MD  isosorbide mononitrate (IMDUR) 30 MG 24 hr tablet TAKE ONE TABLET BY MOUTH EVERY DAY 02/06/22   Fayrene Helper, MD  metoprolol succinate (TOPROL-XL) 50 MG 24 hr tablet TAKE (1) TABLET BY MOUTH DAILY WITH FOOD *TAKE AFTER DIALYSIS* 07/29/21   Fayrene Helper, MD  pregabalin (LYRICA) 50 MG capsule TAKE ONE CAPSULE BY MOUTH three times a day 02/10/22   Sanjuana Kava, MD  sertraline (ZOLOFT) 100 MG tablet Take 1 tablet (100 mg total) by mouth daily. 07/29/21   Fayrene Helper, MD  FLUoxetine (PROZAC) 10 MG capsule Take 10 mg by mouth daily.    05/28/11  [provider]  glipiZIDE (GLUCOTROL) 10 MG tablet Take 10 mg by mouth 2 (two) times daily before a meal.    05/28/11  [provider]      Allergies    Ace inhibitors, Penicillins, Statins, and Albuterol    Review of Systems   Review of Systems  All other systems reviewed and are negative.   Physical Exam Updated Vital Signs BP (!) 152/76 (BP Location: Right Arm)   Pulse 74   Temp 98.2 F (36.8 C) (Oral)   Resp 18   Ht '5\' 4"'$  (1.626 m)   Wt 84.4 kg   SpO2 95%  BMI 31.93 kg/m  Physical Exam Vitals and nursing note reviewed.   67 year old female, resting comfortably and in no acute distress. Vital signs are significant for mildly elevated blood pressure. Oxygen saturation is 95%, which is normal. Head is normocephalic and atraumatic. PERRLA, EOMI. Oropharynx is clear. Neck is nontender and supple without adenopathy or JVD. Back is nontender and there is no CVA tenderness.  There is 2+ presacral edema. Lungs have bibasilar rales.  There are no wheezes or rhonchi. Chest is nontender. Heart has regular rate and rhythm without murmur. Abdomen is soft, flat, nontender. Extremities have 2+ edema, full range of motion is  present. Skin is warm and dry without rash. Neurologic: Mental status is normal, cranial nerves are intact, moves all extremities equally.  ED Results / Procedures / Treatments   Labs (all labs ordered are listed, but only abnormal results are displayed) Labs Reviewed  CBC WITH DIFFERENTIAL/PLATELET - Abnormal; Notable for the following components:      Result Value   Hemoglobin 10.3 (*)    HCT 34.7 (*)    MCH 24.6 (*)    MCHC 29.7 (*)    RDW 18.0 (*)    All other components within normal limits  COMPREHENSIVE METABOLIC PANEL - Abnormal; Notable for the following components:   Glucose, Bld 298 (*)    BUN 77 (*)    Creatinine, Ser 10.33 (*)    Albumin 3.3 (*)    GFR, Estimated 4 (*)    All other components within normal limits  RESP PANEL BY RT-PCR (RSV, FLU A&B, COVID)  [id number]   Radiology DG Chest Port 1 View  Result Date: 04/15/2022 CLINICAL DATA:  Shortness of breath. EXAM: PORTABLE CHEST 1 VIEW COMPARISON:  Chest radiograph dated 09/05/2021. FINDINGS: Mild cardiomegaly with mild vascular congestion. Chronic scarring in the lingula. No pleural effusion or pneumothorax. Atherosclerotic calcification of the aorta. No acute osseous pathology. IMPRESSION: Mild cardiomegaly with mild vascular congestion. Electronically Signed   By: Anner Crete M.D.   On: 04/15/2022 02:04    Procedures Procedures    Medications Ordered in ED Medications - No data to display  ED Course/ Medical Decision Making/ A&P                             Medical Decision Making Amount and/or Complexity of Data Reviewed Labs: ordered. Radiology: ordered.   Weakness with rhinorrhea's, suspect viral illness.  Consider electrolyte disturbance, occult infection.  Dyspnea appears to be secondary to fluid retention with missed dialysis session.  She clearly has edema and has rales on exam.  I have ordered a chest x-ray to look for evidence of pulmonary vascular congestion and pneumonia.  I have reviewed  her laboratory test, my interpretation is elevated BUN and creatinine consistent with known history of end-stage renal disease, elevated random glucose but not at a dangerous level, normocytic anemia which is likely anemia of end-stage renal disease.  Respiratory pathogen panel shows no evidence of COVID-19, influenza, RSV infection.  She is resting comfortably and maintaining adequate oxygen saturation on room air.  Chest x-ray does show pulmonary vascular congestion in keeping with patient's physical exam and the fact that she missed dialysis.  Have independently viewed the image, and agree with radiologist's interpretation.  Patient continues to maintain adequate oxygen saturation on room air, I feel she is safe for discharge.  She will need to have dialysis to help with her fluid overload.  General weakness is likely secondary to a viral illness, will need to be watched closely.  Patient advised to return if symptoms are worsening.  Final Clinical Impression(s) / ED Diagnoses Final diagnoses:  Acute on chronic congestive heart failure, unspecified heart failure type ([place])  Weakness  End-stage renal disease on hemodialysis ([place])  Anemia associated with chronic renal failure    Rx / DC Orders ED Discharge Orders     None         Delora Fuel, MD 82/88/33 [phone]

## 2022-04-16 ENCOUNTER — Telehealth: Payer: Self-pay | Admitting: Family Medicine

## 2022-04-16 ENCOUNTER — Ambulatory Visit (INDEPENDENT_AMBULATORY_CARE_PROVIDER_SITE_OTHER): Payer: Medicare HMO | Admitting: Family

## 2022-04-16 ENCOUNTER — Ambulatory Visit (INDEPENDENT_AMBULATORY_CARE_PROVIDER_SITE_OTHER): Payer: Medicare HMO | Admitting: Internal Medicine

## 2022-04-16 ENCOUNTER — Encounter: Payer: Self-pay | Admitting: Internal Medicine

## 2022-04-16 ENCOUNTER — Encounter (HOSPITAL_BASED_OUTPATIENT_CLINIC_OR_DEPARTMENT_OTHER): Payer: Self-pay | Admitting: Family

## 2022-04-16 VITALS — BP 180/80 | HR 88 | Ht 64.0 in | Wt 197.0 lb

## 2022-04-16 VITALS — BP 136/82 | HR 91 | Ht 64.0 in | Wt 197.4 lb

## 2022-04-16 DIAGNOSIS — E782 Mixed hyperlipidemia: Secondary | ICD-10-CM

## 2022-04-16 DIAGNOSIS — I6523 Occlusion and stenosis of bilateral carotid arteries: Secondary | ICD-10-CM | POA: Diagnosis not present

## 2022-04-16 DIAGNOSIS — N186 End stage renal disease: Secondary | ICD-10-CM

## 2022-04-16 DIAGNOSIS — D352 Benign neoplasm of pituitary gland: Secondary | ICD-10-CM | POA: Diagnosis not present

## 2022-04-16 DIAGNOSIS — Z992 Dependence on renal dialysis: Secondary | ICD-10-CM

## 2022-04-16 DIAGNOSIS — E1122 Type 2 diabetes mellitus with diabetic chronic kidney disease: Secondary | ICD-10-CM | POA: Diagnosis not present

## 2022-04-16 DIAGNOSIS — Z794 Long term (current) use of insulin: Secondary | ICD-10-CM

## 2022-04-16 DIAGNOSIS — E1151 Type 2 diabetes mellitus with diabetic peripheral angiopathy without gangrene: Secondary | ICD-10-CM | POA: Diagnosis not present

## 2022-04-16 DIAGNOSIS — I10 Essential (primary) hypertension: Secondary | ICD-10-CM

## 2022-04-16 DIAGNOSIS — R7989 Other specified abnormal findings of blood chemistry: Secondary | ICD-10-CM | POA: Diagnosis not present

## 2022-04-16 DIAGNOSIS — R011 Cardiac murmur, unspecified: Secondary | ICD-10-CM | POA: Diagnosis not present

## 2022-04-16 DIAGNOSIS — D3500 Benign neoplasm of unspecified adrenal gland: Secondary | ICD-10-CM | POA: Diagnosis not present

## 2022-04-16 LAB — POCT GLYCOSYLATED HEMOGLOBIN (HGB A1C): Hemoglobin A1C: 9.3 % — AB (ref 4.0–5.6)

## 2022-04-16 MED ORDER — ONETOUCH VERIO VI STRP: ORAL_STRIP | 2 refills | Status: DC

## 2022-04-16 MED ORDER — ONETOUCH DELICA LANCETS 33G MISC: 2 refills | Status: DC

## 2022-04-16 MED ORDER — FREESTYLE LIBRE 2 READER DEVI: 1.0000 | Freq: Every day | 3 refills | Status: DC

## 2022-04-16 MED ORDER — FREESTYLE LIBRE 2 SENSOR MISC: 1.0000 | 3 refills | Status: DC

## 2022-04-16 MED ORDER — ONETOUCH VERIO W/DEVICE KIT: PACK | 0 refills | Status: DC

## 2022-04-16 NOTE — Progress Notes (Signed)
Patient ID: Kathryn Beck, female   DOB: 12/12/1955, 67 y.o.   MRN: [id number]  HPI: Kathryn Beck is a 67 y.o.-year-old female, initially referred by her PCP, Dr. Moshe Cipro, returning for follow-up for of DM2, dx in 2008, insulin-dependent since ~2015, uncontrolled, with complications (ESRD, PN). She saw Dr. Dorris Fetch - last OV with him 05/2016.  Last visit with me 5.5 months ago.  She is here alone today.  Interim history: She was previously on peritoneal dialysis but before last visit changed to hemodialysis.  She was previously on the kidney transplant but not anymore as she needs better control of hypertension and diabetes. She denies increased urination, blurry vision, nausea, chest pain.  She mentions that she noticed that her husband's glucometer is giving her lower blood sugars than her own.  Adrenal masses.  Reviewed previous imaging reports: 04/22/2018: MRI abd.: Stable 19 mm nodule in the left adrenal gland, unchanged since 2015, 19 mm otherwise negative.  05/28/2017: MRI abd: Bilateral adrenal nodules with signal dropout on out of phase imaging, consistent with adenomas.   Example at 2.5 cm on the left and on the order of 1.6 cm on the right.  10/10/2015: CT abdomen and pelvis without contrast: Stable 2.2 cm left adrenal nodule with Hounsfield unit measurements 28 unchanged likely a lipid poor adenoma  08/23/2008: MRI of the abdomen with and without contrast: Left adrenal mass measuring 1.6 x 1.5 cm (previously measuring 1.4 x 1.6 cm on the CT scan from 2003 -stable in size),  Of note, she has an enlarging pancreatic head mass - pt reports a h/o pancreatitis. (This will need to be eval. By GI)  Reviewed previous adrenal biochemical investigation: Dexamethasone suppression test showed a low normal, nonsuppressed cortisol, but I suspect that this is due to her end-stage renal disease and dialysis status, rather than increased production. Component     Latest Ref Rng & Units 08/31/2018   Cortisol - AM     mcg/dL 6.1  Dexamethasone, Serum     ng/dL 446   A 24-hour urine cortisol could not be done - on dialysis.  We checked a late-night salivary cortisol as this was elevated: Component     Latest Ref Rng & Units 12/26/2020 Draw date/time: 12/22/20  midnight   Salivary Cortisol Baseline     ug/dL 0.637  Salivary Cortisol 2nd Specimen     ug/dL Reference Range:  Children and Adults:  8:00a.m.:   0.025 - 0.600  Noon:      <0.010 - 0.330  4:00p.m.:   0.010 - 0.200  Midnight:  <0.010 - 0.090  0.634   Previous investigation was negative for hyperaldosteronism or pheochromocytoma: Component     Latest Ref Rng 10/28/2021  Potassium     3.5 - 5.1 mEq/L 3.7   Metanephrine, Pl     <=57 pg/mL <25   Normetanephrine, Pl     <=148 pg/mL 74   Total Metanephrines-Plasma     <=205 pg/mL 74   Epinephrine     pg/mL 31   Norepinephrine     pg/mL 736   Dopamine     pg/mL <10   ALDOSTERONE CANCELED   Renin Activity     0.25 - 5.82 ng/mL/h 0.46   ALDO / PRA Ratio CANCELED   HbA1c, POC (controlled diabetic range)     0.0 - 7.0 %   Total Catecholamines     pg/mL 767    Component     Latest Ref Rng & Units  11/30/2017 10/04/2018 11/21/2020  Epinephrine     pg/mL Undetectable 24 <20  Norepinephrine     pg/mL 99 167 (L) 332  Dopamine     pg/mL Undetectable <10 <10  Catecholamines, Total     pg/mL 99    Total Catecholamines     pg/mL  191 (L) 332  Metanephrine, Pl     <=57 pg/mL <25 <25 <25  Normetanephrine, Pl     <=148 pg/mL 71 38 56  Total Metanephrines-Plasma     <=205 pg/mL 71 38 56  ALDOSTERONE      ng/dL 3 2 <1  Renin Activity     0.25 - 5.82 ng/mL/h 1.20 1.48 0.28  ALDO / PRA Ratio      2.5 1.4 see note  Potassium     3.5 - 5.1 mEq/L 4.0 4.9 4.1   Since last visit, we tried to recheck her salivary cortisol but this was canceled by the lab.  After this, we rechecked a cortisol and ACTH and they returned elevated, with a normal DHEA-S:  Component      Latest Ref Rng 07/22/2021  Salivary Cortisol Baseline     ug/dL CANCELED    Cortisol, Plasma ug/dL 24.7   Comment: AM:  4.3 - 22.4 ug/dLPM:  3.1 - 16.7 ug/dL  Resulting Agency  [place] [facility]     C206 ACTH 6 - 50 pg/mL 85 High    Comment: .  Reference range applies only to specimens collected  between 7am-10am.  .   Resulting Agency  [facility]ICH[facility]   DHEA-Sulfate, LCMS ug/dL 91   Comment: This test was developed and its performance characteristics  determined by [facility]. It has not been cleared or approved  by the Food and Drug Administration.  Reference Range:  Adult Females (61 - 70y): <128   Resulting Agency  [facility]   At that point, I referred the patient for a pituitary MRI:  Pituitary MRI (10/02/2021): showed a microadenoma: Brain: Limited by motion degradation which is pervasive. Small pituitary gland with partially empty sella. There is a hypoenhancing nodule in the right aspect of the pituitary/sella measuring up to 3 mm on coronal postcontrast imaging, see series 26. Increased thickness along the right dorsum sella is ossification by 2019 CT. No infundibular or cavernous sinus disease.   IMPRESSION: 1. 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 Significant motion artifact. 2. Mild chronic small vessel disease.  At our visit from 10/2021, pituitary labs were at goal (with the exception of a slightly low free T3): Component     Latest Ref Rng 10/28/2021  IGF-I, LC/MS     41 - 279 ng/mL 141   Z-Score (Female)     -2.0 - 2.0 SD 0.4   Triiodothyronine,Free,Serum     2.3 - 4.2 pg/mL 2.1 (L)   T4,Free(Direct)     0.60 - 1.60 ng/dL 0.93   Growth Hormone     < OR = 7.1 ng/mL 0.1   Prolactin     ng/mL 11.4   TSH     0.35 - 5.50 uIU/mL 0.91     Due to the possibility of Cushing's disease, I referred her to [facility].  She did not have this appointment yet, as it was rescheduled.  DM2: Reviewed HbA1c levels: Lab Results   Component Value Date   HGBA1C 8.9 (A) 10/28/2021   HGBA1C 9.1 (A) 07/22/2021   HGBA1C 11.4 (A) 05/02/2021  11/01/2020: HbA1c 9.3% 02/09/2020: HbA1c 6.8%  In the  past, pt. was on a regimen of: - Humalog 75/25 20-26 units before each meal, 2-3X a day depending on the CBG before the meal  - Levemir 100 units at bedtime  At last visit she was on: - Toujeo 54 >>  was only taking 34 units in the evening -but out for 2 months - Humalog >> on NovoLog 70/30 24-30 units 3-4x a day (!!!????) 24 units before a smaller meal 26-28 units before a regular meal 30 units before a larger meal Take Humalog EVERY TIME you eat, 15 min before the meal, if sugars are >70. - Humalog sliding scale: - 150-175: + 1 unit  - 176-200: + 2 units  - 201-225: + 3 units  - 226-250: + 4 units  - 251-275: + 5 units - 276-300: + 6 units  Try not to correct sugars at bedtime unless they are >300 and in that case, take no more than 4 units.  At last visit, she was off her insulin for at least a month.  We restarted: - Toujeo 40 >> 60 >> 80 units in the evening  - Fiasp: Take FiAsp EVERY TIME you eat, 15 min before the meal, if sugars are >70. >> 40 units  Pt was checking her sugars 2x a day: per her meter: -In the office today, blood sugar was 160 fasting - am: 240-300s >> 186, 340 >> 40, 186-HI >> 220-290, 409 - 2h after b'fast: n/c  >> 297-451, 587 - lunch: 240s >> n/c >> n/c - 2h after lunch: n/c >> 519 >> n/c - dinner: 200-300s >> 486 >> 216, 469 >> 210-290 - 2h after dinner: n/c >> 385  - bedtime: 300-HI >> 135, 265, 560, 595 >> n/c >> 286 - nighttime: 197, 224 Lowest sugar was 31 ... >> 42 >> 135 >> 40 >> 40; it is unclear at which level she has hypoglycemia awareness. Highest sugar was HI >> 500s >> 519, HI>> HI.  Glucometer: Accuchek  Pt's meals are: - Breakfast: eggs, bacon, oatmeal, grits - Lunch: salads - Dinner: meat + veggie or sandwich + salad or nabs - Snacks: nabs, fruit cups, jello  -+  ESRD, on dialysis: Lab Results  Component Value Date   BUN 77 (H) 04/14/2022   BUN 32 (H) 10/02/2021   CREATININE 10.33 (H) 04/14/2022   CREATININE 7.08 (H) 10/02/2021  Off olmesartan.  On irbesartan now.  -+ HL; last set of lipids: Lab Results  Component Value Date   CHOL 190 11/21/2020   HDL 43.10 11/21/2020   LDLCALC 114 (H) 03/08/2018   LDLDIRECT 120.0 11/21/2020   TRIG 259 (H) 12/02/2020   CHOLHDL 4 11/21/2020  Previously on Lipitor 40, now on Zetia due to transaminitis with statins.  - last eye exam was in 07/2021: no DR  -No numbness but she has tingling in her feet.  She is on Neurontin 300 mg 3 times a day.  Last foot exam 02/2021.  She also has HTN. She has been in the hospital repeatedly in the past for pancreatitis. She also has a history of MGUS, increased ferritin, osteoporosis.  ROS: + See HPI  I reviewed pt's medications, allergies, PMH, social hx, family hx, and changes were documented in the history of present illness. Otherwise, unchanged from my initial visit note.  Past Medical History:  Diagnosis Date   Acid reflux    Anemia    Arthritis    Axillary masses    Soft tissue - status post excision  Back pain    COVID-19 virus infection 04/06/2019   Depression    End-stage renal disease ([place])    TTHSat  in [place] hypertension    History of blood transfusion    History of cardiac catheterization    Normal coronary arteries October 2020   History of claustrophobia    History of pneumonia 2019   Hypoxia 04/03/2019   Mixed hyperlipidemia    Obesity    Pancreatitis    Peritoneal dialysis catheter in place [facility])    Pneumonia due to COVID-19 virus 04/02/2019   Sleep apnea    Noncompliant with CPAP   Type 2 diabetes mellitus [facility])    Past Surgical History:  Procedure Laterality Date   ABDOMINAL HYSTERECTOMY     AV FISTULA PLACEMENT Left 09/02/2017   Procedure: creation of left arm ARTERIOVENOUS (AV) FISTULA;  Surgeon: Serafina Mitchell, MD;  Location: [place];  Service: Vascular;  Laterality: Left;   COLONOSCOPY  2008   Dr. Oneida Alar: normal    COLONOSCOPY N/A 12/18/2016   Dr. Oneida Alar: multiple tubular adenomas, internal hemorrhoids. Surveillance in 3 years    ESOPHAGEAL DILATION N/A 10/13/2015   Procedure: ESOPHAGEAL DILATION;  Surgeon: Rogene Houston, MD;  [facility];  Service: Endoscopy;  Laterality: N/A;   ESOPHAGOGASTRODUODENOSCOPY N/A 10/13/2015   Dr. Laural Golden: chronic gastritis on path, no H.pylori. Empiric dilation    ESOPHAGOGASTRODUODENOSCOPY N/A 12/18/2016   Dr. Oneida Alar: mild gastritis. BRAVO study revealed uncontrolled GERD. Dysphagia secondary to uncontrolled reflux   FOOT SURGERY Bilateral    "nerve"     LEFT HEART CATH AND CORONARY ANGIOGRAPHY N/A 12/29/2018   Procedure: LEFT HEART CATH AND CORONARY ANGIOGRAPHY;  Surgeon: Jettie Booze, MD;  Location: [place] CV LAB;  Service: Cardiovascular;  Laterality: N/A;   LUNG BIOPSY     MASS EXCISION Right 01/09/2013   Procedure: EXCISION OF NEOPLASM OF RIGHT  AXILLA  AND EXCISION OF NEOPLASM OF LEFT AXILLA;  Surgeon: Jamesetta So, MD;  Location: [facility];  Service: General;  Laterality: Right;  procedure end @ 08:23   MYRINGOTOMY WITH TUBE PLACEMENT Bilateral 04/28/2017   Procedure: BILATERAL MYRINGOTOMY WITH TUBE PLACEMENT;  Surgeon: Leta Baptist, MD;  Location: [place];  Service: ENT;  Laterality: Bilateral;   REVISION OF ARTERIOVENOUS GORETEX GRAFT Left 05/04/2018   Procedure: TRANSPOSITION OF CEPHALIC VEIN ARTERIOVENOUS FISTULA LEFT ARM;  Surgeon: Rosetta Posner, MD;  Location: [place];  Service: Vascular;  Laterality: Left;   SAVORY DILATION N/A 12/18/2016   Procedure: SAVORY DILATION;  Surgeon: Danie Binder, MD;  [facility];  Service: Endoscopy;  Laterality: N/A;   Social History   Socioeconomic History   Marital status: Married    Spouse name: Not on file   Number of children: 2  Occupational History   CNA  Tobacco Use   Smoking  status: Never Smoker   Smokeless tobacco: Never Used  Substance and Sexual Activity   Alcohol use: No   Drug use: No   Current Outpatient Medications on File Prior to Visit  Medication Sig Dispense Refill   aspirin 81 MG chewable tablet CHEW 2 TABLETS BY MOUTH ONCE DAILY 56 tablet 11   calcitRIOL (ROCALTROL) 0.25 MCG capsule TAKE 1 CAPSULE (0.25 MCG TOTAL) BY MOUTH 2 (TWO) TIMES DAILY WITH A MEAL. 60 capsule 6   ciprofloxacin-dexamethasone (CIPRODEX) OTIC suspension Place 4 drops into both ears 2 (two) times daily. 7.5 mL 0   cloNIDine (CATAPRES)  0.2 MG tablet Take 1 tablet (0.2 mg total) by mouth 2 (two) times daily. 60 tablet 3   DULoxetine (CYMBALTA) 60 MG capsule TAKE (1) CAPSULE BY MOUTH ONCE DAILY. (Patient taking differently: Take 60 mg by mouth daily.) 28 capsule 11   ezetimibe (ZETIA) 10 MG tablet TAKE ONE TABLET BY MOUTH EVERY DAY 28 tablet 11   insulin aspart (FIASP FLEXTOUCH) 100 UNIT/ML FlexTouch Pen Inject 18-30 Units into the skin with breakfast, with lunch, and with evening meal. 45 mL 3   insulin glargine, 2 Unit Dial, (TOUJEO MAX SOLOSTAR) 300 UNIT/ML Solostar Pen Inject 80 Units into the skin daily. 18 mL 3   Insulin Pen Needle 32G X 4 MM MISC Use 4x a day 300 each 3   isosorbide mononitrate (IMDUR) 30 MG 24 hr tablet TAKE ONE TABLET BY MOUTH EVERY DAY 28 tablet 11   metoprolol succinate (TOPROL-XL) 50 MG 24 hr tablet TAKE (1) TABLET BY MOUTH DAILY WITH FOOD *TAKE AFTER DIALYSIS* 28 tablet 11   pregabalin (LYRICA) 50 MG capsule TAKE ONE CAPSULE BY MOUTH three times a day 84 capsule 5   sertraline (ZOLOFT) 100 MG tablet Take 1 tablet (100 mg total) by mouth daily. 90 tablet 2   [DISCONTINUED] FLUoxetine (PROZAC) 10 MG capsule Take 10 mg by mouth daily.       [DISCONTINUED] glipiZIDE (GLUCOTROL) 10 MG tablet Take 10 mg by mouth 2 (two) times daily before a meal.       Current Facility-Administered Medications on File Prior to Visit  Medication Dose Route Frequency Provider  Last Rate Last Admin   0.9 %  sodium chloride infusion   Intravenous Continuous Lockamy, Randi L, NP-C 20 mL/hr at 07/19/19 1641 Rate Verify at 07/19/19 1641   Allergies  Allergen Reactions   Ace Inhibitors Anaphylaxis and Swelling   Penicillins Itching, Swelling and Other (See Comments)    Did it involve swelling of the face/tongue/throat, SOB, or low BP? Unknown Did it involve sudden or severe rash/hives, skin peeling, or any reaction on the inside of your mouth or nose? Unknown Did you need to seek medical attention at a hospital or doctor's office? Unknown When did it last happen?      years  If all above answers are "NO", may proceed with cephalosporin use.    Statins Other (See Comments)    elevated LFT's     Albuterol Swelling   Family History  Problem Relation Age of Onset   Hypertension Father    Hypercholesterolemia Father    Arthritis Father    Hypertension Sister    Hypercholesterolemia Sister    Breast cancer Sister    Hypertension Sister    Colon cancer Neg Hx    Colon polyps Neg Hx    PE: BP 136/82 (BP Location: Right Arm, Patient Position: Sitting, Cuff Size: Normal)   Pulse 91   Ht '5\' 4"'$  (1.626 m)   Wt 197 lb 6.4 oz (89.5 kg)   SpO2 97%   BMI 33.88 kg/m  Wt Readings from Last 3 Encounters:  04/16/22 197 lb 6.4 oz (89.5 kg)  04/16/22 197 lb (89.4 kg)  04/14/22 186 lb (84.4 kg)   Constitutional: overweight, in NAD, moon facies, truncal obesity Eyes: EOMI, no exophthalmos ENT: moist mucous membranes, no thyromegaly, no cervical lymphadenopathy Cardiovascular: tachycardia, RR, No RG, +1/6 SEM, + bilateral leg swelling, pitting Respiratory: CTA B Musculoskeletal: no deformities Skin: moist, warm, + dark macular rash and dry skin on her chest and neck  Neurological: no tremor with outstretched hands  ASSESSMENT: 1. DM2, insulin-dependent, uncontrolled, with complications - CKD stage 4 - PN  2. HL  3. B adrenal adenomas  4.   Hypercortisolism  5. Pituitary microadenoma  PLAN:  1.  Very complex patient with longstanding, uncontrolled, type 2 diabetes, previously on premixed insulin regimen, now on basal/bolus insulin changed last year.  Her HbA1c initially decreased from 11.4% to 9.1% and then improved even further, to 8.9% at last visit.  However, since last visit, she has seen fluctuating blood sugars, mostly higher than 200s.  She did notice that when checked with her husband's glucometer, sugars are better, in the 200s or lower. -At today's visit, we checked her blood sugar in the office and this was 160, fasting.  This is more consistent with the better blood sugars that she is seeing with her husband's meter.  However, the HbA1c today, 9.3%, correlates with higher numbers. -Unfortunately, due to this conundrum, it is very difficult to adjust her insulin regimen today.  She does mention that she has to eat a snack at bedtime so that blood sugars are not dropping overnight.  I advised her to reduce the Fiasp insulin with dinner to avoid having to eat snack.  Also, I advised her to try to get the freestyle libre 2 CGM and connected to our clinic so we can download her tracings.  We also sent a prescription for a new glucometer to her pharmacy. - I suggested to:  Patient Instructions  Please continue: - Toujeo 80 units daily - Fiasp: up to 10 min before a meal: 40 units  - reduce the FiAsp with dinner to 30-35 units. After this, stop the bedtime snack.  Try to start the Freestyle Libre CGM. Allow our clinic to see the sensor traces (see separate instructions).  Change your meter.  Please schedule an appt. With a podiatrist - [place].  Please return in 3 months.      - we checked her HbA1c: 9.3% (higher) - advised to check sugars at different times of the day - 4x a day, rotating check times - advised for yearly eye exams >> she is UTD - return to clinic in 3-4 months  2. HL -Reviewed latest lipid  panel from 11/2020: LDL above target, triglycerides also high: Lab Results  Component Value Date   CHOL 190 11/21/2020   HDL 43.10 11/21/2020   LDLCALC 114 (H) 03/08/2018   LDLDIRECT 120.0 11/21/2020   TRIG 259 (H) 12/02/2020   CHOLHDL 4 11/21/2020  She continues on Zetia 10 mg daily.  She could not tolerate statins due to history of transaminitis -will check her Lipids at next OV  3.  Bilateral adrenal adenoma -Patient with history of bilateral adrenal adenomas per review of her abdominal MRI from 2019.  Imaging characteristics pointed towards benign tumors, of 2.5 and 1.6 cm, respectively.  However, they have appeared to increase over time.  Reviewing the MRI of the abdomen from 2010 and the CT of the abdomen and pelvis from 2017, only 1 nodule was seen in both, in the left adrenal.  She had another MRI in 03/2018 which showed a stable left 1.9 cm adrenal nodule. -We investigated her adrenal status.  Plasma catecholamines, metanephrines, and aldosterone  were normal between 2019-2022.  Norepinephrine was slightly elevated (nonspecific) 09/2018.  At last visit, plasma catecholamines, metanephrines, aldosterone, and renin activity were all normal. -Investigation of her cortisol status is difficult due to being on dialysis.  These patients usually have higher cortisol level due to increased inflammation but not necessarily increased cortisol production.  However, repeated investigation for this returned positive.   4.  Hypercortisolism -Investigation for autonomous cortisol production was positive: Patient had a nonsuppressible cortisol level by dexamethasone suppression test, high salivary cortisol, and also an elevated serum cortisol along with an elevated ACTH level and normal DHEA-S. -These results pointing towards a central etiology for her hypercortisolism, therefore, we checked a pituitary MRI, which was positive for pituitary mass. -Therefore, it is possible that patient may have either  been on hormone producing adenoma or Cushing's disease.  To determine between the 2, she may need to have an inferior petrosal sinus sampling test, which is not available here.  She is waiting for an appointment with [facility].  5.  Pituitary microadenoma -After ACTH and cortisol returned elevated, we checked a pituitary MRI (10/02/2021).  This revealed a possible 3 mm pituitary mass.  -At last visit, we checked her pituitary hormones and they were normal with the exception of a slightly low free T3. -It is possible that this is the source of her cortisol production, however, she most likely will need petrosal sinus sampling before possible surgery-we discussed that we do not have the option of doing it here.  That is why, I would like to refer her to [facility]. -We discussed about the possible risk of hypopituitarism after the surgery, but also resolution of her hypercortisolism with most likely improvement in blood pressure, diabetes, weight, risk of osteoporosis and immunosuppression -As mentioned above, she is waiting for an appointment with [facility] for this problem  [name], MD PhD [facility]

## 2022-04-16 NOTE — Progress Notes (Signed)
Advanced Hypertension Clinic Initial Assessment:    Date:  04/16/2022   ID:  Kathryn Beck, DOB 1955/12/31, MRN [id number]  PCP:  Fayrene Helper, MD  Cardiologist:  Rozann Lesches, MD  Nephrologist:  Referring MD: Fayrene Helper, MD   CC: Hypertension  History of Present Illness:    Kathryn Beck is a 67 y.o. female with a hx of hypertension, ESRD on HD, DM2, chronic diastolic heart failure, sleep apnea here to establish care in the Advanced Hypertension Clinic. She follows with Dr. Domenic Polite.   Prior cardiac catheterization October 2020 with normal coronary arteries. Echo 01/17/19 normal LVEF 60-65%, moderate LVH, impaired diastolic function, RV normal, mild MR.  Referred by her PCP, Dr. Moshe Cipro, for uncontrolled hypertension as well as episodes of hypotension during HD.   Presents today with her husband. She has ESRD and has been on HD for 4-5 years. Her dialysis days are M, W, F. She did have ED visit 04/14/22 for shortness of breath after missing a day of HD due to not feeling well.   Kathryn Beck was diagnosed with hypertension in her 22s. It has been difficult to control. Blood pressure checked with arm cuff at home. Readings have been 170s. Never smoker and does not drink alcohol. No formal exercise routine.   She waits to take all of her medications until after dialysis. Often at HD, initial BP 222/100s then after she starts running the machine it goes down to 170s-180s. Also with episodes of hypotension during HD. Last episode 2 weeks ago.   She notes exertional dyspnea which has been ongoing. She has had some orthopnea over the last couple of nights. She attributes this to not wearing her CPPA. She just completed repeat home sleep study. She notes lower extremity edema which is intermittent. She does restrict to 32 oz fluid intake per day per nephrology.   Her pills are organized via pill pack. However, some medication sin pill pack and some still in the  bottles. She is taking Imdur, Clonidine, Metoprolol, Amlodipine in the morning after HD. She thinks she has been taking her Clonidine just once per day.   Previous antihypertensives: ACE - anaphylaxis, swelling   Past Medical History:  Diagnosis Date   Acid reflux    Anemia    Arthritis    Axillary masses    Soft tissue - status post excision   Back pain    COVID-19 virus infection 04/06/2019   Depression    End-stage renal disease ([place])    TTHSat  in [place] hypertension    History of blood transfusion    History of cardiac catheterization    Normal coronary arteries October 2020   History of claustrophobia    History of pneumonia 2019   Hypoxia 04/03/2019   Mixed hyperlipidemia    Obesity    Pancreatitis    Peritoneal dialysis catheter in place [facility])    Pneumonia due to COVID-19 virus 04/02/2019   Sleep apnea    Noncompliant with CPAP   Type 2 diabetes mellitus ([place])     Past Surgical History:  Procedure Laterality Date   ABDOMINAL HYSTERECTOMY     AV FISTULA PLACEMENT Left 09/02/2017   Procedure: creation of left arm ARTERIOVENOUS (AV) FISTULA;  Surgeon: Serafina Mitchell, MD;  Location: [place];  Service: Vascular;  Laterality: Left;   COLONOSCOPY  2008   Dr. Oneida Alar: normal    COLONOSCOPY N/A 12/18/2016   Dr. Oneida Alar: multiple tubular  adenomas, internal hemorrhoids. Surveillance in 3 years    ESOPHAGEAL DILATION N/A 10/13/2015   Procedure: ESOPHAGEAL DILATION;  Surgeon: Rogene Houston, MD;  [facility];  Service: Endoscopy;  Laterality: N/A;   ESOPHAGOGASTRODUODENOSCOPY N/A 10/13/2015   Dr. Laural Golden: chronic gastritis on path, no H.pylori. Empiric dilation    ESOPHAGOGASTRODUODENOSCOPY N/A 12/18/2016   Dr. Oneida Alar: mild gastritis. BRAVO study revealed uncontrolled GERD. Dysphagia secondary to uncontrolled reflux   FOOT SURGERY Bilateral    "nerve"     LEFT HEART CATH AND CORONARY ANGIOGRAPHY N/A 12/29/2018   Procedure: LEFT HEART CATH AND CORONARY  ANGIOGRAPHY;  Surgeon: Jettie Booze, MD;  Location: [place] [facility];  Service: Cardiovascular;  Laterality: N/A;   LUNG BIOPSY     MASS EXCISION Right 01/09/2013   Procedure: EXCISION OF NEOPLASM OF RIGHT  AXILLA  AND EXCISION OF NEOPLASM OF LEFT AXILLA;  Surgeon: Jamesetta So, MD;  Location: [facility];  Service: General;  Laterality: Right;  procedure end @ 08:23   MYRINGOTOMY WITH TUBE PLACEMENT Bilateral 04/28/2017   Procedure: BILATERAL MYRINGOTOMY WITH TUBE PLACEMENT;  Surgeon: Leta Baptist, MD;  Location: [place];  Service: ENT;  Laterality: Bilateral;   REVISION OF ARTERIOVENOUS GORETEX GRAFT Left 05/04/2018   Procedure: TRANSPOSITION OF CEPHALIC VEIN ARTERIOVENOUS FISTULA LEFT ARM;  Surgeon: Rosetta Posner, MD;  Location: [place];  Service: Vascular;  Laterality: Left;   SAVORY DILATION N/A 12/18/2016   Procedure: SAVORY DILATION;  Surgeon: Danie Binder, MD;  [facility];  Service: Endoscopy;  Laterality: N/A;    Current Medications: Current Meds  Medication Sig   amLODipine (NORVASC) 10 MG tablet Take 10 mg by mouth daily.   aspirin 81 MG chewable tablet CHEW 2 TABLETS BY MOUTH ONCE DAILY   calcitRIOL (ROCALTROL) 0.25 MCG capsule TAKE 1 CAPSULE (0.25 MCG TOTAL) BY MOUTH 2 (TWO) TIMES DAILY WITH A MEAL.   ciprofloxacin-dexamethasone (CIPRODEX) OTIC suspension Place 4 drops into both ears 2 (two) times daily.   cloNIDine (CATAPRES) 0.2 MG tablet Take 1 tablet (0.2 mg total) by mouth 2 (two) times daily.   DULoxetine (CYMBALTA) 60 MG capsule TAKE (1) CAPSULE BY MOUTH ONCE DAILY. (Patient taking differently: Take 60 mg by mouth daily.)   ezetimibe (ZETIA) 10 MG tablet TAKE ONE TABLET BY MOUTH EVERY DAY   insulin aspart (FIASP FLEXTOUCH) 100 UNIT/ML FlexTouch Pen Inject 18-30 Units into the skin with breakfast, with lunch, and with evening meal.   insulin glargine, 2 Unit Dial, (TOUJEO MAX SOLOSTAR) 300 UNIT/ML Solostar Pen Inject 80 Units into the skin daily.   Insulin Pen  Needle 32G X 4 MM MISC Use 4x a day   isosorbide mononitrate (IMDUR) 30 MG 24 hr tablet TAKE ONE TABLET BY MOUTH EVERY DAY   metoprolol succinate (TOPROL-XL) 50 MG 24 hr tablet TAKE (1) TABLET BY MOUTH DAILY WITH FOOD *TAKE AFTER DIALYSIS*   pregabalin (LYRICA) 50 MG capsule TAKE ONE CAPSULE BY MOUTH three times a day   sertraline (ZOLOFT) 100 MG tablet Take 1 tablet (100 mg total) by mouth daily.     Allergies:   Ace inhibitors, Penicillins, Statins, and Albuterol   Social History   Socioeconomic History   Marital status: Married    Spouse name: larry    Number of children: 2   Years of education: 12   Highest education level: 12th grade  Occupational History   Occupation: retired   Tobacco Use   Smoking status: Never  Smokeless tobacco: Never  Vaping Use   Vaping Use: Never used  Substance and Sexual Activity   Alcohol use: No   Drug use: No   Sexual activity: Not Currently  Other Topics Concern   Not on file  Social History Narrative   Lives alone with husband    Social Determinants of Health   Financial Resource Strain: High Risk (05/08/2021)   Overall Financial Resource Strain (CARDIA)    Difficulty of Paying Living Expenses: Hard  Food Insecurity: Food Insecurity Present (05/08/2021)   Hunger Vital Sign    Worried About Running Out of Food in the Last Year: Sometimes true    Ran Out of Food in the Last Year: Never true  Transportation Needs: Unmet Transportation Needs (05/08/2021)   PRAPARE - Transportation    Lack of Transportation (Medical): Yes    Lack of Transportation (Non-Medical): Yes  Physical Activity: Inactive (11/20/2021)   Exercise Vital Sign    Days of Exercise per Week: 0 days    Minutes of Exercise per Session: 0 min  Stress: Stress Concern Present (11/20/2021)   [place]    Feeling of Stress : To some extent  Social Connections: Socially Integrated (05/08/2021)   Social  Connection and Isolation Panel [NHANES]    Frequency of Communication with Friends and Family: More than three times a week    Frequency of Social Gatherings with Friends and Family: Once a week    Attends Religious Services: More than 4 times per year    Active Member of [employer] or Organizations: Yes    Attends Archivist Meetings: 1 to 4 times per year    Marital Status: Married     Family History: The patient's family history includes Arthritis in her father; Breast cancer in her sister; Hypercholesterolemia in her father and sister; Hypertension in her father, sister, and sister. There is no history of Colon cancer or Colon polyps.  ROS:   Please see the history of present illness.     All other systems reviewed and are negative.  EKGs/Labs/Other Studies Reviewed:    EKG:  EKG is  ordered today.  The ekg ordered today demonstrates NSR 80 bpm with no acute ST/T wave changes.  Recent Labs: 07/06/2021: B Natriuretic Peptide 521.0; Magnesium 2.1 10/28/2021: TSH 0.91 04/14/2022: ALT 33; BUN 77; Creatinine, Ser 10.33; Hemoglobin 10.3; Platelets 310; Potassium 5.1; Sodium 140   Recent Lipid Panel    Component Value Date/Time   CHOL 190 11/21/2020 1402   TRIG 259 (H) 12/02/2020 1754   HDL 43.10 11/21/2020 1402   CHOLHDL 4 11/21/2020 1402   VLDL 54.4 (H) 11/21/2020 1402   LDLCALC 114 (H) 03/08/2018 1234   LDLCALC 109 (H) 11/12/2017 0936   LDLDIRECT 120.0 11/21/2020 1402    Physical Exam:   VS:  BP (!) 180/80   Pulse 88   Ht '5\' 4"'$  (1.626 m)   Wt 197 lb (89.4 kg)   BMI 33.81 kg/m  , BMI Body mass index is 33.81 kg/m. GENERAL:  Well appearing HEENT: Pupils equal round and reactive, fundi not visualized, oral mucosa unremarkable NECK:  No jugular venous distention, waveform within normal limits, carotid upstroke brisk and symmetric, no bruits, no thyromegaly LYMPHATICS:  No cervical adenopathy LUNGS:  Clear to auscultation bilaterally HEART:  RRR.  PMI not displaced or  sustained,S1 and S2 within normal limits, no S3, no S4, no clicks, no rubs, no murmurs ABD:  Flat, positive  bowel sounds normal in frequency in pitch, no bruits, no rebound, no guarding, no midline pulsatile mass, no hepatomegaly, no splenomegaly EXT:  2 plus pulses throughout, no edema, no cyanosis no clubbing SKIN:  No rashes no nodules NEURO:  Cranial nerves II through XII grossly intact, motor grossly intact throughout PSYCH:  Cognitively intact, oriented to person place and time   ASSESSMENT/PLAN:    HTN - BP not at goal. Predominantly hypertensive but also with episodes of hypotension at HD. Last hypotensive spell 2 weeks ago. She thinks she is taking Clonidine QD - increase to BID. Continue Amlodipine '10mg'$  QD, Metoprolol '50mg'$  QD, Imdur '30mg'$  QD. Phone call in 1 week to check in. If BP not at goal, plan to increase Imdur. Future considerations include Hydralazine.  Carotid duplex given previous mild stenosis by CT 2019.  Update echocardiogram to assess LVH.  Cushings/Adrenal workup previously completed by Dr. Cruzita Lederer of endocrinology detailed below.  Recently completed sleep study, await results.  Refer to PREP exercise program.  She will bring BP cuff to next OV so we can ensure accuracy.   HFpEF - Volume management per HD. Update echo, as above.  ESRD on HD - Follows with nephrology.   Dm2 - Follows with Dr. Renne Crigler of endocrinology.  HLD - Did not tolerate statin due to elevated LFTs. Continue Zetia '10mg'$  QD. Aortic atherosclerosis by CT, consider disussion of PCSK9i at follow up. Not addressed at this clinic visit.   Bilateral adrenal adenoma - Followed by Dr. Cruzita Lederer of endocrinology. MRI 2019 bilateral adrenal adenoma. 2019-2022 plasma catecholamines, metanephrines, aldosterone normal.   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 Prior workup for autonomous cortisol production. Pituitary MRI positive pituitary mass. Per Dr. Renne Crigler hormone producing adenoma or  Cushing's disease - awaits appt with [facility] to discuss inferior petrosal sinus sampling test.   Screening for Secondary Hypertension:     04/16/2022    8:23 PM  Causes  Sleep Apnea Screened  Thyroid Disease Screened  Pheochromocytoma Screened     - Comments per endocrinology  Cushing's Syndrome Screened     - Comments per endocrinology  Coarctation of the Aorta Screened     - Comments carotid duplex ordered 04/16/22  Compliance Screened     - Comments mix of pill pack and pill bottles makes compliance difficult to ascertain    Relevant Labs/Studies:    Latest Ref Rng & Units 04/14/2022    9:34 PM 10/28/2021   10:40 AM 10/02/2021    8:05 AM  Basic Labs  Sodium 135 - 145 mmol/L 140   141   Potassium 3.5 - 5.1 mmol/L 5.1  3.7  4.3   Creatinine 0.44 - 1.00 mg/dL 10.33   7.08        Latest Ref Rng & Units 10/28/2021   10:40 AM 12/24/2016   10:42 AM  Thyroid   TSH 0.35 - 5.50 uIU/mL 0.91  4.16        10/28/2021   10:40 AM 11/21/2020    2:02 PM 10/04/2018   12:07 PM 11/30/2017   10:53 AM  Renin/Aldosterone   Aldosterone CANCELED  '1  2  3        '$ Latest Ref Rng & Units 10/28/2021   10:40 AM 11/21/2020    2:02 PM 10/04/2018   12:07 PM 11/30/2017   10:53 AM  Metanephrines/Catecholamines   Epinephrine pg/mL 31  <20  24  see note   Norepinephrine pg/mL 736  332  167  99  Dopamine pg/mL <10  <10  <10  see note   Metanephrines <=57 pg/mL <25  <25  <25  <25   Normetanephrines  <=148 pg/mL 74  56  38  71        Latest Ref Rng & Units 07/22/2021    8:29 AM  Cortisol  Cortisol  ug/dL 24.7           she  interested in enrolling in the PREP exercise and nutrition program through the [facility].     Disposition:    FU with MD/PharmD in 3 weeks    Medication Adjustments/Labs and Tests Ordered: Current medicines are reviewed at length with the patient today.  Concerns regarding medicines are outlined above.  Orders Placed This Encounter  Procedures   Amb Referral To Provider Referral Exercise  Program (P.R.E.P)   EKG 12-Lead   ECHOCARDIOGRAM COMPLETE   VAS US CAROTID   No orders of the defined types were placed in this encounter.    Signed, Loel Dubonnet, NP  04/16/2022 8:26 PM    [facility]

## 2022-04-16 NOTE — Patient Instructions (Addendum)
Please continue: - Toujeo 80 units daily - Fiasp: up to 10 min before a meal: 40 units  - reduce the FiAsp with dinner to 30-35 units. After this, stop the bedtime snack.  Try to start the Freestyle Libre CGM. Allow our clinic to see the sensor traces (see separate instructions).  Change your meter.  Please schedule an appt. With a podiatrist - [place].  Please return in 3 months.

## 2022-04-16 NOTE — Patient Instructions (Addendum)
Medication Instructions:  Your physician has recommended you make the following change in your medication:   CHANGE Clonidine to 0.'2mg'$  twice per day at 10AM (after dialysis) and 10 PM  We will reach out to pharmacy to get your most current medication list.   Testing/Procedures: Your physician has requested that you have an echocardiogram. Echocardiography is a painless test that uses sound waves to create images of your heart. It provides your doctor with information about the size and shape of your heart and how well your heart's chambers and valves are working. This procedure takes approximately one hour. There are no restrictions for this procedure. Please do NOT wear cologne, perfume, aftershave, or lotions (deodorant is allowed). Please arrive 15 minutes prior to your appointment time.   Your physician has requested that you have a carotid duplex. This test is an ultrasound of the carotid arteries in your neck. It looks at blood flow through these arteries that supply the brain with blood. Allow one hour for this exam. There are no restrictions or special instructions.    Follow-Up: In 3 weeks with Dr. Oval Linsey or Loel Dubonnet, NP in Hypertension Clinic    Special Instructions:   DASH Eating Plan DASH stands for Dietary Approaches to Stop Hypertension. The DASH eating plan is a healthy eating plan that has been shown to: Reduce high blood pressure (hypertension). Reduce your risk for type 2 diabetes, heart disease, and stroke. Help with weight loss. What are tips for following this plan? Reading food labels Check food labels for the amount of salt (sodium) per serving. Choose foods with less than 5 percent of the Daily Value of sodium. Generally, foods with less than 300 milligrams (mg) of sodium per serving fit into this eating plan. To find whole grains, look for the word "whole" as the first word in the ingredient list. Shopping Buy products labeled as "low-sodium" or "no  salt added." Buy fresh foods. Avoid canned foods and pre-made or frozen meals. Cooking Avoid adding salt when cooking. Use salt-free seasonings or herbs instead of table salt or sea salt. Check with your health care provider or pharmacist before using salt substitutes. Do not fry foods. Cook foods using healthy methods such as baking, boiling, grilling, roasting, and broiling instead. Cook with heart-healthy oils, such as olive, canola, avocado, soybean, or sunflower oil. Meal planning  Eat a balanced diet that includes: 4 or more servings of fruits and 4 or more servings of vegetables each day. Try to fill one-half of your plate with fruits and vegetables. 6-8 servings of whole grains each day. Less than 6 oz (170 g) of lean meat, poultry, or fish each day. A 3-oz (85-g) serving of meat is about the same size as a deck of cards. One egg equals 1 oz (28 g). 2-3 servings of low-fat dairy each day. One serving is 1 cup (237 mL). 1 serving of nuts, seeds, or beans 5 times each week. 2-3 servings of heart-healthy fats. Healthy fats called omega-3 fatty acids are found in foods such as walnuts, flaxseeds, fortified milks, and eggs. These fats are also found in cold-water fish, such as sardines, salmon, and mackerel. Limit how much you eat of: Canned or prepackaged foods. Food that is high in trans fat, such as some fried foods. Food that is high in saturated fat, such as fatty meat. Desserts and other sweets, sugary drinks, and other foods with added sugar. Full-fat dairy products. Do not salt foods before eating. Do not eat  more than 4 egg yolks a week. Try to eat at least 2 vegetarian meals a week. Eat more home-cooked food and less restaurant, buffet, and fast food. Lifestyle When eating at a restaurant, ask that your food be prepared with less salt or no salt, if possible. If you drink alcohol: Limit how much you use to: 0-1 drink a day for women who are not pregnant. 0-2 drinks a day  for men. Be aware of how much alcohol is in your drink. In the [place] one drink equals one 12 oz bottle of beer (355 mL), one 5 oz glass of wine (148 mL), or one 1 oz glass of hard liquor (44 mL). General information Avoid eating more than 2,300 mg of salt a day. If you have hypertension, you may need to reduce your sodium intake to 1,500 mg a day. Work with your health care provider to maintain a healthy body weight or to lose weight. Ask what an ideal weight is for you. Get at least 30 minutes of exercise that causes your heart to beat faster (aerobic exercise) most days of the week. Activities may include walking, swimming, or biking. Work with your health care provider or dietitian to adjust your eating plan to your individual calorie needs. What foods should I eat? Fruits All fresh, dried, or frozen fruit. Canned fruit in natural juice (without added sugar). Vegetables Fresh or frozen vegetables (raw, steamed, roasted, or grilled). Low-sodium or reduced-sodium tomato and vegetable juice. Low-sodium or reduced-sodium tomato sauce and tomato paste. Low-sodium or reduced-sodium canned vegetables. Grains Whole-grain or whole-wheat bread. Whole-grain or whole-wheat pasta. Brown rice. [place]. Bulgur. Whole-grain and low-sodium cereals. Pita bread. Low-fat, low-sodium crackers. Whole-wheat flour tortillas. Meats and other proteins Skinless chicken or [place]. Ground chicken or [place]. Pork with fat trimmed off. Fish and seafood. Egg whites. Dried beans, peas, or lentils. Unsalted nuts, nut butters, and seeds. Unsalted canned beans. Lean cuts of beef with fat trimmed off. Low-sodium, lean precooked or cured meat, such as sausages or meat loaves. Dairy Low-fat (1%) or fat-free (skim) milk. Reduced-fat, low-fat, or fat-free cheeses. Nonfat, low-sodium ricotta or cottage cheese. Low-fat or nonfat yogurt. Low-fat, low-sodium cheese. Fats and oils Soft margarine without trans fats. Vegetable oil.  Reduced-fat, low-fat, or light mayonnaise and salad dressings (reduced-sodium). Canola, safflower, olive, avocado, soybean, and sunflower oils. Avocado. Seasonings and condiments Herbs. Spices. Seasoning mixes without salt. Other foods Unsalted popcorn and pretzels. Fat-free sweets. The items listed above may not be a complete list of foods and beverages you can eat. Contact a dietitian for more information. What foods should I avoid? Fruits Canned fruit in a light or heavy syrup. Fried fruit. Fruit in cream or butter sauce. Vegetables Creamed or fried vegetables. Vegetables in a cheese sauce. Regular canned vegetables (not low-sodium or reduced-sodium). Regular canned tomato sauce and paste (not low-sodium or reduced-sodium). Regular tomato and vegetable juice (not low-sodium or reduced-sodium). Angie Fava. Olives. Grains Baked goods made with fat, such as croissants, muffins, or some breads. Dry pasta or rice meal packs. Meats and other proteins Fatty cuts of meat. Ribs. Fried meat. Berniece Salines. Bologna, salami, and other precooked or cured meats, such as sausages or meat loaves. Fat from the back of a pig (fatback). Bratwurst. Salted nuts and seeds. Canned beans with added salt. Canned or smoked fish. Whole eggs or egg yolks. Chicken or [place] with skin. Dairy Whole or 2% milk, cream, and half-and-half. Whole or full-fat cream cheese. Whole-fat or sweetened yogurt. Full-fat cheese. Nondairy creamers.  Whipped toppings. Processed cheese and cheese spreads. Fats and oils Butter. Stick margarine. Lard. Shortening. Ghee. Bacon fat. Tropical oils, such as coconut, palm kernel, or palm oil. Seasonings and condiments Onion salt, garlic salt, seasoned salt, table salt, and sea salt. Worcestershire sauce. Tartar sauce. Barbecue sauce. Teriyaki sauce. Soy sauce, including reduced-sodium. Steak sauce. Canned and packaged gravies. Fish sauce. Oyster sauce. Cocktail sauce. Store-bought horseradish. Ketchup. Mustard.  Meat flavorings and tenderizers. Bouillon cubes. Hot sauces. Pre-made or packaged marinades. Pre-made or packaged taco seasonings. Relishes. Regular salad dressings. Other foods Salted popcorn and pretzels. The items listed above may not be a complete list of foods and beverages you should avoid. Contact a dietitian for more information. Where to find more information National Heart, Lung, and Blood Institute: https://Jiraiya [place]cewan-eaton.com/ American Heart Association: www.heart.org Academy of Nutrition and Dietetics: www.eatright.[place]: www.kidney.org Summary The DASH eating plan is a healthy eating plan that has been shown to reduce high blood pressure (hypertension). It may also reduce your risk for type 2 diabetes, heart disease, and stroke. When on the DASH eating plan, aim to eat more fresh fruits and vegetables, whole grains, lean proteins, low-fat dairy, and heart-healthy fats. With the DASH eating plan, you should limit salt (sodium) intake to 2,300 mg a day. If you have hypertension, you may need to reduce your sodium intake to 1,500 mg a day. Work with your health care provider or dietitian to adjust your eating plan to your individual calorie needs. This information is not intended to replace advice given to you by your health care provider. Make sure you discuss any questions you have with your health care provider. Document Revised: 02/10/2019 Document Reviewed: 02/10/2019 Elsevier Patient Education  [place].

## 2022-04-16 NOTE — Telephone Encounter (Signed)
Daughter called in regard to patient. Wants a call back.

## 2022-04-17 ENCOUNTER — Telehealth: Payer: Self-pay

## 2022-04-17 ENCOUNTER — Telehealth: Payer: Self-pay | Admitting: *Deleted

## 2022-04-17 ENCOUNTER — Encounter: Payer: Self-pay | Admitting: Family Medicine

## 2022-04-17 DIAGNOSIS — Z992 Dependence on renal dialysis: Secondary | ICD-10-CM | POA: Diagnosis not present

## 2022-04-17 DIAGNOSIS — N2581 Secondary hyperparathyroidism of renal origin: Secondary | ICD-10-CM | POA: Diagnosis not present

## 2022-04-17 DIAGNOSIS — N186 End stage renal disease: Secondary | ICD-10-CM | POA: Diagnosis not present

## 2022-04-17 NOTE — Telephone Encounter (Signed)
Contacted regarding PREP Class referral. Interested in participating at the [facility]. Will begin class 05/26/2022 Tuesday/Thursday [id number]. Will call patient back to set up assessment visit in February.

## 2022-04-17 NOTE — Telephone Encounter (Signed)
Called to discuss PREP program, she would like to attend at [facility], will ask Bev RN [facility] contact her with class schedule.

## 2022-04-18 ENCOUNTER — Other Ambulatory Visit: Payer: Self-pay | Admitting: Family Medicine

## 2022-04-18 DIAGNOSIS — H9203 Otalgia, bilateral: Secondary | ICD-10-CM

## 2022-04-20 DIAGNOSIS — Z992 Dependence on renal dialysis: Secondary | ICD-10-CM | POA: Diagnosis not present

## 2022-04-20 DIAGNOSIS — N2581 Secondary hyperparathyroidism of renal origin: Secondary | ICD-10-CM | POA: Diagnosis not present

## 2022-04-20 DIAGNOSIS — N186 End stage renal disease: Secondary | ICD-10-CM | POA: Diagnosis not present

## 2022-04-21 ENCOUNTER — Ambulatory Visit (INDEPENDENT_AMBULATORY_CARE_PROVIDER_SITE_OTHER): Payer: 59 | Admitting: Family Medicine

## 2022-04-21 ENCOUNTER — Telehealth: Payer: Self-pay

## 2022-04-21 ENCOUNTER — Encounter: Payer: Self-pay | Admitting: Family Medicine

## 2022-04-21 VITALS — BP 200/80 | HR 86 | Ht 64.0 in | Wt 190.1 lb

## 2022-04-21 DIAGNOSIS — Z794 Long term (current) use of insulin: Secondary | ICD-10-CM | POA: Diagnosis not present

## 2022-04-21 DIAGNOSIS — S99921A Unspecified injury of right foot, initial encounter: Secondary | ICD-10-CM

## 2022-04-21 DIAGNOSIS — R29898 Other symptoms and signs involving the musculoskeletal system: Secondary | ICD-10-CM

## 2022-04-21 DIAGNOSIS — E1165 Type 2 diabetes mellitus with hyperglycemia: Secondary | ICD-10-CM | POA: Diagnosis not present

## 2022-04-21 DIAGNOSIS — I5032 Chronic diastolic (congestive) heart failure: Secondary | ICD-10-CM

## 2022-04-21 DIAGNOSIS — Z741 Need for assistance with personal care: Secondary | ICD-10-CM

## 2022-04-21 DIAGNOSIS — I10 Essential (primary) hypertension: Secondary | ICD-10-CM

## 2022-04-21 MED ORDER — DOXYCYCLINE HYCLATE 100 MG PO TABS: 100.0000 mg | ORAL_TABLET | Freq: Two times a day (BID) | ORAL | 0 refills | Status: DC

## 2022-04-21 NOTE — Telephone Encounter (Signed)
        Patient  visited [place] on 1/24   Telephone encounter attempt :  1st  A HIPAA compliant voice message was left requesting a return call.  Instructed patient to call back   [place] [phone] [address], [place], [place] [address] Phone: [phone] Email: [email or web address]

## 2022-04-21 NOTE — Telephone Encounter (Signed)
Appt 04/21/22

## 2022-04-21 NOTE — Patient Instructions (Addendum)
Follow-up in March as before, call if you need me sooner.  You are referred urgently to the podiatrist because of the injury on your right great toe, I am also prescribing a 7-day antibiotic course.  Please keep the toe clean and dry and do not wear shoes that press on the toe.  Your blood pressure management is through the advanced hypertensive clinic as well as to nephrology this continues to be a challenge  You certainly need assistance in your home regularly with personal care as well as keeping your home environment cleaning and cooking due to increased weakness and shortness of breath as well as confusion.  I recommend that your family directly contact Medicare to see if you can get assistance with this for days 4 hours/day 5 days/week for starters.  I will also specifically refer you to the social worker with  [facility] to see if they can assist you.  You also are requesting an walk-in shower and that will be noted as well due to difficulty ambulating and inability to safely get into the tub.    Nurse will follow-up on the wheelchair that has been ordered several weeks ago not clear why you have not got it and get back to you Thanks for choosing [facility], we consider it a privelige to serve you.

## 2022-04-22 ENCOUNTER — Telehealth: Payer: Self-pay

## 2022-04-22 DIAGNOSIS — Z992 Dependence on renal dialysis: Secondary | ICD-10-CM | POA: Diagnosis not present

## 2022-04-22 DIAGNOSIS — N186 End stage renal disease: Secondary | ICD-10-CM | POA: Diagnosis not present

## 2022-04-22 DIAGNOSIS — N2581 Secondary hyperparathyroidism of renal origin: Secondary | ICD-10-CM | POA: Diagnosis not present

## 2022-04-22 NOTE — Telephone Encounter (Signed)
        Patient  visited [place] on 1/24   Telephone encounter attempt :  2nd  A HIPAA compliant voice message was left requesting a return call.  Instructed patient to call back   [place] [phone] [address], [place],  [address] Phone: [phone] Email: [email or web address]

## 2022-04-23 ENCOUNTER — Encounter (HOSPITAL_BASED_OUTPATIENT_CLINIC_OR_DEPARTMENT_OTHER): Payer: Self-pay

## 2022-04-24 ENCOUNTER — Inpatient Hospital Stay (HOSPITAL_COMMUNITY)
Admission: RE | Admit: 2022-04-24 | Discharge: 2022-05-07 | DRG: 252 | Disposition: A | Discharge status: Home Health | Payer: Medicare HMO | Attending: Internal Medicine | Admitting: Internal Medicine

## 2022-04-24 ENCOUNTER — Encounter (HOSPITAL_COMMUNITY): Payer: Self-pay

## 2022-04-24 ENCOUNTER — Other Ambulatory Visit: Payer: Self-pay

## 2022-04-24 ENCOUNTER — Encounter (HOSPITAL_COMMUNITY): Payer: Self-pay | Admitting: Internal Medicine

## 2022-04-24 DIAGNOSIS — N250 Renal osteodystrophy: Secondary | ICD-10-CM | POA: Diagnosis not present

## 2022-04-24 DIAGNOSIS — E43 Unspecified severe protein-calorie malnutrition: Secondary | ICD-10-CM | POA: Diagnosis not present

## 2022-04-24 DIAGNOSIS — Z794 Long term (current) use of insulin: Secondary | ICD-10-CM

## 2022-04-24 DIAGNOSIS — I5032 Chronic diastolic (congestive) heart failure: Secondary | ICD-10-CM | POA: Diagnosis present

## 2022-04-24 DIAGNOSIS — I509 Heart failure, unspecified: Secondary | ICD-10-CM | POA: Diagnosis not present

## 2022-04-24 DIAGNOSIS — M1990 Unspecified osteoarthritis, unspecified site: Secondary | ICD-10-CM | POA: Diagnosis present

## 2022-04-24 DIAGNOSIS — E1122 Type 2 diabetes mellitus with diabetic chronic kidney disease: Secondary | ICD-10-CM | POA: Diagnosis present

## 2022-04-24 DIAGNOSIS — Z91199 Patient's noncompliance with other medical treatment and regimen due to unspecified reason: Secondary | ICD-10-CM

## 2022-04-24 DIAGNOSIS — I132 Hypertensive heart and chronic kidney disease with heart failure and with stage 5 chronic kidney disease, or end stage renal disease: Secondary | ICD-10-CM | POA: Diagnosis present

## 2022-04-24 DIAGNOSIS — I10 Essential (primary) hypertension: Secondary | ICD-10-CM | POA: Diagnosis present

## 2022-04-24 DIAGNOSIS — E785 Hyperlipidemia, unspecified: Secondary | ICD-10-CM | POA: Diagnosis present

## 2022-04-24 DIAGNOSIS — Z90710 Acquired absence of both cervix and uterus: Secondary | ICD-10-CM

## 2022-04-24 DIAGNOSIS — Z8616 Personal history of COVID-19: Secondary | ICD-10-CM

## 2022-04-24 DIAGNOSIS — F05 Delirium due to known physiological condition: Secondary | ICD-10-CM | POA: Diagnosis not present

## 2022-04-24 DIAGNOSIS — J811 Chronic pulmonary edema: Secondary | ICD-10-CM | POA: Diagnosis not present

## 2022-04-24 DIAGNOSIS — F32A Depression, unspecified: Secondary | ICD-10-CM | POA: Diagnosis present

## 2022-04-24 DIAGNOSIS — K219 Gastro-esophageal reflux disease without esophagitis: Secondary | ICD-10-CM | POA: Diagnosis present

## 2022-04-24 DIAGNOSIS — R531 Weakness: Secondary | ICD-10-CM | POA: Diagnosis not present

## 2022-04-24 DIAGNOSIS — L97512 Non-pressure chronic ulcer of other part of right foot with fat layer exposed: Secondary | ICD-10-CM | POA: Diagnosis present

## 2022-04-24 DIAGNOSIS — Z6831 Body mass index (BMI) 31.0-31.9, adult: Secondary | ICD-10-CM

## 2022-04-24 DIAGNOSIS — Z8261 Family history of arthritis: Secondary | ICD-10-CM

## 2022-04-24 DIAGNOSIS — J189 Pneumonia, unspecified organism: Secondary | ICD-10-CM | POA: Diagnosis not present

## 2022-04-24 DIAGNOSIS — G4733 Obstructive sleep apnea (adult) (pediatric): Secondary | ICD-10-CM | POA: Diagnosis present

## 2022-04-24 DIAGNOSIS — E1165 Type 2 diabetes mellitus with hyperglycemia: Secondary | ICD-10-CM | POA: Diagnosis not present

## 2022-04-24 DIAGNOSIS — Z880 Allergy status to penicillin: Secondary | ICD-10-CM | POA: Diagnosis not present

## 2022-04-24 DIAGNOSIS — I120 Hypertensive chronic kidney disease with stage 5 chronic kidney disease or end stage renal disease: Secondary | ICD-10-CM | POA: Diagnosis not present

## 2022-04-24 DIAGNOSIS — N186 End stage renal disease: Secondary | ICD-10-CM

## 2022-04-24 DIAGNOSIS — E1152 Type 2 diabetes mellitus with diabetic peripheral angiopathy with gangrene: Secondary | ICD-10-CM | POA: Diagnosis not present

## 2022-04-24 DIAGNOSIS — I70221 Atherosclerosis of native arteries of extremities with rest pain, right leg: Secondary | ICD-10-CM | POA: Diagnosis present

## 2022-04-24 DIAGNOSIS — D631 Anemia in chronic kidney disease: Secondary | ICD-10-CM | POA: Diagnosis not present

## 2022-04-24 DIAGNOSIS — I96 Gangrene, not elsewhere classified: Secondary | ICD-10-CM | POA: Diagnosis present

## 2022-04-24 DIAGNOSIS — Z992 Dependence on renal dialysis: Secondary | ICD-10-CM | POA: Diagnosis not present

## 2022-04-24 DIAGNOSIS — R600 Localized edema: Secondary | ICD-10-CM | POA: Diagnosis not present

## 2022-04-24 DIAGNOSIS — F40240 Claustrophobia: Secondary | ICD-10-CM | POA: Diagnosis present

## 2022-04-24 DIAGNOSIS — E669 Obesity, unspecified: Secondary | ICD-10-CM | POA: Diagnosis present

## 2022-04-24 DIAGNOSIS — E1142 Type 2 diabetes mellitus with diabetic polyneuropathy: Secondary | ICD-10-CM | POA: Diagnosis present

## 2022-04-24 DIAGNOSIS — J9811 Atelectasis: Secondary | ICD-10-CM | POA: Diagnosis not present

## 2022-04-24 DIAGNOSIS — G629 Polyneuropathy, unspecified: Secondary | ICD-10-CM

## 2022-04-24 DIAGNOSIS — R7401 Elevation of levels of liver transaminase levels: Secondary | ICD-10-CM | POA: Diagnosis present

## 2022-04-24 DIAGNOSIS — I739 Peripheral vascular disease, unspecified: Secondary | ICD-10-CM | POA: Diagnosis present

## 2022-04-24 DIAGNOSIS — R0600 Dyspnea, unspecified: Secondary | ICD-10-CM | POA: Diagnosis not present

## 2022-04-24 DIAGNOSIS — E782 Mixed hyperlipidemia: Secondary | ICD-10-CM | POA: Diagnosis present

## 2022-04-24 DIAGNOSIS — R0902 Hypoxemia: Secondary | ICD-10-CM | POA: Diagnosis not present

## 2022-04-24 DIAGNOSIS — Z1152 Encounter for screening for COVID-19: Secondary | ICD-10-CM | POA: Diagnosis not present

## 2022-04-24 DIAGNOSIS — I70235 Atherosclerosis of native arteries of right leg with ulceration of other part of foot: Secondary | ICD-10-CM | POA: Diagnosis not present

## 2022-04-24 DIAGNOSIS — N19 Unspecified kidney failure: Secondary | ICD-10-CM | POA: Diagnosis not present

## 2022-04-24 DIAGNOSIS — Z7982 Long term (current) use of aspirin: Secondary | ICD-10-CM

## 2022-04-24 DIAGNOSIS — L0390 Cellulitis, unspecified: Secondary | ICD-10-CM | POA: Diagnosis not present

## 2022-04-24 DIAGNOSIS — Z7984 Long term (current) use of oral hypoglycemic drugs: Secondary | ICD-10-CM

## 2022-04-24 DIAGNOSIS — L97519 Non-pressure chronic ulcer of other part of right foot with unspecified severity: Secondary | ICD-10-CM | POA: Diagnosis not present

## 2022-04-24 DIAGNOSIS — Z79899 Other long term (current) drug therapy: Secondary | ICD-10-CM

## 2022-04-24 DIAGNOSIS — Z8249 Family history of ischemic heart disease and other diseases of the circulatory system: Secondary | ICD-10-CM

## 2022-04-24 DIAGNOSIS — Z888 Allergy status to other drugs, medicaments and biological substances status: Secondary | ICD-10-CM

## 2022-04-24 LAB — GLUCOSE, CAPILLARY: Glucose-Capillary: 140 mg/dL — ABNORMAL HIGH (ref 70–99)

## 2022-04-24 NOTE — H&P (Signed)
History and Physical    Patient: Kathryn Beck LSL:[id number] DOB: 06-Jan-1956 DOA: 04/24/2022 DOS: the patient was seen and examined on 04/24/2022 PCP: Fayrene Helper, MD  Patient coming from: Outside Hospital  Chief Complaint: Toe infection HPI: Kathryn Beck is a 67 y.o. female with medical history significant of ESRD on MWF dialysis, last dialysis on Wed.  Pt missed dialysis today.  Pt presents to ED at [facility] with several day history of worsening gangrene of R great toe.  Also has cough and malaise.  Tm 100.6.  BP 141/76.  WBC 28k  COVID, FLU, RSV neg  CXR showing pulm edema.  Pt transferred to [facility] as [facility] doesn't have dialysis available.  On my evaluation, pt denies respiratory distress.  She and husband are most concerned about her R great toe which is necrotic.  They are concerned (correctly) that it is at risk for needing amputation.   Review of Systems: As mentioned in the history of present illness. All other systems reviewed and are negative. Past Medical History:  Diagnosis Date   Acid reflux    Anemia    Arthritis    Axillary masses    Soft tissue - status post excision   Back pain    COVID-19 virus infection 04/06/2019   Depression    End-stage renal disease (HCC)    TTHSat  in [place] hypertension    History of blood transfusion    History of cardiac catheterization    Normal coronary arteries October 2020   History of claustrophobia    History of pneumonia 2019   Hypoxia 04/03/2019   Mixed hyperlipidemia    Obesity    Pancreatitis    Peritoneal dialysis catheter in place [facility])    Pneumonia due to COVID-19 virus 04/02/2019   Sleep apnea    Noncompliant with CPAP   Type 2 diabetes mellitus ([place])    Past Surgical History:  Procedure Laterality Date   ABDOMINAL HYSTERECTOMY     AV FISTULA PLACEMENT Left 09/02/2017   Procedure: creation of left arm ARTERIOVENOUS (AV) FISTULA;  Surgeon: Serafina Mitchell, MD;  Location: [place];   Service: Vascular;  Laterality: Left;   COLONOSCOPY  2008   Dr. Oneida Alar: normal    COLONOSCOPY N/A 12/18/2016   Dr. Oneida Alar: multiple tubular adenomas, internal hemorrhoids. Surveillance in 3 years    ESOPHAGEAL DILATION N/A 10/13/2015   Procedure: ESOPHAGEAL DILATION;  Surgeon: Rogene Houston, MD;  [facility];  Service: Endoscopy;  Laterality: N/A;   ESOPHAGOGASTRODUODENOSCOPY N/A 10/13/2015   Dr. Laural Golden: chronic gastritis on path, no H.pylori. Empiric dilation    ESOPHAGOGASTRODUODENOSCOPY N/A 12/18/2016   Dr. Oneida Alar: mild gastritis. BRAVO study revealed uncontrolled GERD. Dysphagia secondary to uncontrolled reflux   FOOT SURGERY Bilateral    "nerve"     LEFT HEART CATH AND CORONARY ANGIOGRAPHY N/A 12/29/2018   Procedure: LEFT HEART CATH AND CORONARY ANGIOGRAPHY;  Surgeon: Jettie Booze, MD;  Location: [place] CV LAB;  Service: Cardiovascular;  Laterality: N/A;   LUNG BIOPSY     MASS EXCISION Right 01/09/2013   Procedure: EXCISION OF NEOPLASM OF RIGHT  AXILLA  AND EXCISION OF NEOPLASM OF LEFT AXILLA;  Surgeon: Jamesetta So, MD;  Location: [facility];  Service: General;  Laterality: Right;  procedure end @ 08:23   MYRINGOTOMY WITH TUBE PLACEMENT Bilateral 04/28/2017   Procedure: BILATERAL MYRINGOTOMY WITH TUBE PLACEMENT;  Surgeon: Leta Baptist, MD;  Location: [place];  Service: ENT;  Laterality: Bilateral;   REVISION OF ARTERIOVENOUS GORETEX GRAFT Left 05/04/2018   Procedure: TRANSPOSITION OF CEPHALIC VEIN ARTERIOVENOUS FISTULA LEFT ARM;  Surgeon: Rosetta Posner, MD;  Location[facility];  Service: Vascular;  Laterality: Left;   SAVORY DILATION N/A 12/18/2016   Procedure: SAVORY DILATION;  Surgeon: Danie Binder, MD;  [facility];  Service: Endoscopy;  Laterality: N/A;   Social History:  reports that she has never smoked. She has never used smokeless tobacco. She reports that she does not drink alcohol and does not use drugs.  Allergies  Allergen Reactions   Ace  Inhibitors Anaphylaxis and Swelling   Penicillins Itching, Swelling and Other (See Comments)    Did it involve swelling of the face/tongue/throat, SOB, or low BP? Unknown Did it involve sudden or severe rash/hives, skin peeling, or any reaction on the inside of your mouth or nose? Unknown Did you need to seek medical attention at a hospital or doctor's office? Unknown When did it last happen?      years  If all above answers are "NO", may proceed with cephalosporin use.    Statins Other (See Comments)    elevated LFT's     Albuterol Swelling    Family History  Problem Relation Age of Onset   Hypertension Father    Hypercholesterolemia Father    Arthritis Father    Hypertension Sister    Hypercholesterolemia Sister    Breast cancer Sister    Hypertension Sister    Colon cancer Neg Hx    Colon polyps Neg Hx     Prior to Admission medications   Medication Sig Start Date End Date Taking? Authorizing Provider  amLODipine (NORVASC) 10 MG tablet Take 5 mg by mouth 2 (two) times daily.    [provider]  aspirin 81 MG chewable tablet CHEW 2 TABLETS BY MOUTH ONCE DAILY 07/25/21   Fayrene Helper, MD  B Complex-C-Folic Acid (RENA-VITE RX) 1 MG TABS Take 1 tablet by mouth daily. 04/15/22   [provider]  Blood Glucose Monitoring Suppl (ONETOUCH VERIO) w/Device KIT Use to check blood sugar 4X daily. 04/16/22   [name], MD  calcitRIOL (ROCALTROL) 0.25 MCG capsule TAKE 1 CAPSULE (0.25 MCG TOTAL) BY MOUTH 2 (TWO) TIMES DAILY WITH A MEAL. Patient not taking: Reported on 04/21/2022 11/13/21   Cassandria Anger, MD  ciprofloxacin-dexamethasone (CIPRODEX) OTIC suspension Place 4 drops into both ears 2 (two) times daily. Patient not taking: Reported on 04/21/2022 02/05/22   Fayrene Helper, MD  cloNIDine (CATAPRES) 0.2 MG tablet Take 1 tablet (0.2 mg total) by mouth 2 (two) times daily. 02/05/22   Fayrene Helper, MD  Continuous Blood Gluc Receiver  (FREESTYLE LIBRE 2 READER) DEVI 1 each by Does not apply route daily. 04/16/22   [name], MD  Continuous Blood Gluc Sensor (FREESTYLE LIBRE 2 SENSOR) MISC 1 each by Does not apply route every 14 (fourteen) days. 04/16/22   [name], MD  doxycycline (VIBRA-TABS) 100 MG tablet Take 1 tablet (100 mg total) by mouth 2 (two) times daily. 04/21/22   Fayrene Helper, MD  DULoxetine (CYMBALTA) 60 MG capsule TAKE (1) CAPSULE BY MOUTH ONCE DAILY. Patient taking differently: Take 60 mg by mouth daily. 09/17/20   Fayrene Helper, MD  ezetimibe (ZETIA) 10 MG tablet TAKE ONE TABLET BY MOUTH EVERY DAY 02/06/22   Fayrene Helper, MD  furosemide (LASIX) 80 MG tablet Take 80 mg by mouth 2 (two) times daily.  04/20/22   [provider]  glucose blood (ONETOUCH VERIO) test strip Use as instructed to check blood sugar 4X daily. 04/16/22   [name], MD  insulin aspart (FIASP FLEXTOUCH) 100 UNIT/ML FlexTouch Pen Inject 18-30 Units into the skin with breakfast, with lunch, and with evening meal. 10/28/21   [name], MD  insulin glargine, 2 Unit Dial, (TOUJEO MAX SOLOSTAR) 300 UNIT/ML Solostar Pen Inject 80 Units into the skin daily. 10/28/21   [name], MD  Insulin Pen Needle 32G X 4 MM MISC Use 4x a day 03/02/22   [name], MD  isosorbide mononitrate (IMDUR) 30 MG 24 hr tablet TAKE ONE TABLET BY MOUTH EVERY DAY 02/06/22   Fayrene Helper, MD  metoprolol succinate (TOPROL-XL) 50 MG 24 hr tablet TAKE (1) TABLET BY MOUTH DAILY WITH FOOD *TAKE AFTER DIALYSIS* Patient not taking: Reported on 04/21/2022 07/29/21   Fayrene Helper, MD  OneTouch Delica Lancets 37T MISC Use to check blood sugar 4X daily. 04/16/22   [name], MD  pregabalin (LYRICA) 50 MG capsule TAKE ONE CAPSULE BY MOUTH three times a day 02/10/22   Sanjuana Kava, MD  sertraline (ZOLOFT) 100 MG tablet Take 1 tablet (100 mg total) by mouth daily. 07/29/21   Fayrene Helper, MD   [name] 500 MG chewable tablet Chew by mouth. 04/15/22   [provider]  FLUoxetine (PROZAC) 10 MG capsule Take 10 mg by mouth daily.    05/28/11  [provider]  glipiZIDE (GLUCOTROL) 10 MG tablet Take 10 mg by mouth 2 (two) times daily before a meal.    05/28/11  [provider]    Physical Exam: Vitals:   04/24/22 2135 04/24/22 2200 04/24/22 2218 04/24/22 2300  BP: 113/61  113/61   Pulse:  89 95 93  Resp: 14  14   Temp: 98.7 F (37.1 C) 98.7 F (37.1 C) 98.7 F (37.1 C)   TempSrc: Oral Oral Oral   SpO2: 92% 100% 98% 97%  Weight: 87.2 kg  87.2 kg   Height: '5\' 4"'$  (1.626 m)  '5\' 4"'$  (1.626 m)    Constitutional: NAD, calm, comfortable Respiratory: Few crackles, no respiratory distress, no accessory muscle use Cardiovascular: Regular rate and rhythm, no murmurs / rubs / gallops. No extremity edema. 2+ pedal pulses. No carotid bruits.  Abdomen: no tenderness, no masses palpated. No hepatosplenomegaly. Bowel sounds positive.  Skin:      Neurologic: Decreased sensation BLE  Psychiatric: Normal judgment and insight. Alert and oriented x 3. Normal mood.   Data Reviewed:    CXR from OSH: Cardiomegaly with mild-to-moderate pulmonary edema, increased from  prior exam.   WBC 28k  K 5.1  Assessment and Plan: * Diabetic wet gangrene of the foot ([place]) LE wound pathway Empiric cefepime, flagyl, vanc Wound care consult Needs ortho vs podiatry consult in AM ABI No osteo per OSH X Ray.  ESRD on hemodialysis ([place]) K.5.1, CXR showing pulm edema but pt laying down flat with no severe SOB, no accessory muscle use, satting 98% on RA. No emergent dialysis needs tonight Repeat BMP now Tele monitor Call nephrology in AM for routine IP dialysis during stay.  Type 2 diabetes mellitus with hyperglycemia ([place]) Despite endocrine office visit just a couple of days ago.  Pt and husband aren't sure of her insulin dosing at home. Per endocrine office visit she is  SUPPOSED to be on 80u glargine which she takes during the day (husband thought she took 66 he  tells me today), and 40u novolog TID AC. Im going to low ball her insulin numbers because I dont want to risk hypoglycemia: Glargine 40u in AM Novolog 6u TID AC Resistant scale SSI AC Diabetes coordinator consult Suspect above numbers will have to be increased significantly      Advance Care Planning:   Code Status: Full Code  Consults: None, call Ortho vs Podiatry in AM, also call nephrology in AM  Family Communication: Husband at bedside  Severity of Illness: The appropriate patient status for this patient is INPATIENT. Inpatient status is judged to be reasonable and necessary in order to provide the required intensity of service to ensure the patient's safety. The patient's presenting symptoms, physical exam findings, and initial radiographic and laboratory data in the context of their chronic comorbidities is felt to place them at high risk for further clinical deterioration. Furthermore, it is not anticipated that the patient will be medically stable for discharge from the hospital within 2 midnights of admission.   * I certify that at the point of admission it is my clinical judgment that the patient will require inpatient hospital care spanning beyond 2 midnights from the point of admission due to high intensity of service, high risk for further deterioration and high frequency of surveillance required.*  Author: Etta Quill., DO 04/24/2022 11:22 PM  For on call review [email or web address].

## 2022-04-25 ENCOUNTER — Inpatient Hospital Stay (HOSPITAL_COMMUNITY): Payer: Medicare HMO

## 2022-04-25 DIAGNOSIS — E1152 Type 2 diabetes mellitus with diabetic peripheral angiopathy with gangrene: Secondary | ICD-10-CM | POA: Insufficient documentation

## 2022-04-25 DIAGNOSIS — I96 Gangrene, not elsewhere classified: Secondary | ICD-10-CM

## 2022-04-25 DIAGNOSIS — N186 End stage renal disease: Secondary | ICD-10-CM

## 2022-04-25 DIAGNOSIS — L0390 Cellulitis, unspecified: Secondary | ICD-10-CM | POA: Diagnosis not present

## 2022-04-25 DIAGNOSIS — I739 Peripheral vascular disease, unspecified: Secondary | ICD-10-CM | POA: Diagnosis not present

## 2022-04-25 DIAGNOSIS — E1165 Type 2 diabetes mellitus with hyperglycemia: Secondary | ICD-10-CM | POA: Diagnosis not present

## 2022-04-25 DIAGNOSIS — I70235 Atherosclerosis of native arteries of right leg with ulceration of other part of foot: Secondary | ICD-10-CM

## 2022-04-25 DIAGNOSIS — E43 Unspecified severe protein-calorie malnutrition: Secondary | ICD-10-CM | POA: Diagnosis not present

## 2022-04-25 DIAGNOSIS — Z992 Dependence on renal dialysis: Secondary | ICD-10-CM

## 2022-04-25 HISTORY — DX: Type 2 diabetes mellitus with diabetic peripheral angiopathy with gangrene: E11.52

## 2022-04-25 LAB — COMPREHENSIVE METABOLIC PANEL
ALT: 37 U/L (ref 0–44)
AST: 25 U/L (ref 15–41)
Albumin: 2.5 g/dL — ABNORMAL LOW (ref 3.5–5.0)
Alkaline Phosphatase: 70 U/L (ref 38–126)
Anion gap: 17 — ABNORMAL HIGH (ref 5–15)
BUN: 64 mg/dL — ABNORMAL HIGH (ref 8–23)
CO2: 23 mmol/L (ref 22–32)
Calcium: 8.6 mg/dL — ABNORMAL LOW (ref 8.9–10.3)
Chloride: 102 mmol/L (ref 98–111)
Creatinine, Ser: 8.15 mg/dL — ABNORMAL HIGH (ref 0.44–1.00)
GFR, Estimated: 5 mL/min — ABNORMAL LOW (ref >60)
Glucose, Bld: 142 mg/dL — ABNORMAL HIGH (ref 70–99)
Potassium: 4.6 mmol/L (ref 3.5–5.1)
Sodium: 142 mmol/L (ref 135–145)
Total Bilirubin: 0.6 mg/dL (ref 0.3–1.2)
Total Protein: 5.8 g/dL — ABNORMAL LOW (ref 6.5–8.1)

## 2022-04-25 LAB — CBC WITH DIFFERENTIAL/PLATELET
Abs Immature Granulocytes: 0 10*3/uL (ref 0.00–0.07)
Basophils Absolute: 0 10*3/uL (ref 0.0–0.1)
Basophils Relative: 0 %
Eosinophils Absolute: 0 10*3/uL (ref 0.0–0.5)
Eosinophils Relative: 0 %
HCT: 32.1 % — ABNORMAL LOW (ref 36.0–46.0)
Hemoglobin: 10.2 g/dL — ABNORMAL LOW (ref 12.0–15.0)
Lymphocytes Relative: 4 %
Lymphs Abs: 1.2 10*3/uL (ref 0.7–4.0)
MCH: 25 pg — ABNORMAL LOW (ref 26.0–34.0)
MCHC: 31.8 g/dL (ref 30.0–36.0)
MCV: 78.7 fL — ABNORMAL LOW (ref 80.0–100.0)
Monocytes Absolute: 1.2 10*3/uL — ABNORMAL HIGH (ref 0.1–1.0)
Monocytes Relative: 4 %
Neutro Abs: 27.8 10*3/uL — ABNORMAL HIGH (ref 1.7–7.7)
Neutrophils Relative %: 92 %
Platelets: 291 10*3/uL (ref 150–400)
RBC: 4.08 MIL/uL (ref 3.87–5.11)
RDW: 16.9 % — ABNORMAL HIGH (ref 11.5–15.5)
WBC: 30.2 10*3/uL — ABNORMAL HIGH (ref 4.0–10.5)
nRBC: 0.1 % (ref 0.0–0.2)
nRBC: 1 /100 WBC — ABNORMAL HIGH

## 2022-04-25 LAB — GLUCOSE, CAPILLARY
Glucose-Capillary: 175 mg/dL — ABNORMAL HIGH (ref 70–99)
Glucose-Capillary: 133 mg/dL — ABNORMAL HIGH (ref 70–99)
Glucose-Capillary: 172 mg/dL — ABNORMAL HIGH (ref 70–99)

## 2022-04-25 LAB — PHOSPHORUS: Phosphorus: 5.9 mg/dL — ABNORMAL HIGH (ref 2.5–4.6)

## 2022-04-25 LAB — HEPATITIS B SURFACE ANTIGEN: Hepatitis B Surface Ag: NONREACTIVE

## 2022-04-25 LAB — C-REACTIVE PROTEIN: CRP: 29.4 mg/dL — ABNORMAL HIGH (ref <1.0)

## 2022-04-25 LAB — SEDIMENTATION RATE: Sed Rate: 37 mm/hr — ABNORMAL HIGH (ref 0–22)

## 2022-04-25 LAB — VAS US ABI WITH/WO TBI
Left ABI: 0.42
Right ABI: 0.4

## 2022-04-25 LAB — PREALBUMIN: Prealbumin: 24 mg/dL (ref 18–38)

## 2022-04-25 LAB — MAGNESIUM: Magnesium: 1.7 mg/dL (ref 1.7–2.4)

## 2022-04-25 LAB — HIV ANTIBODY (ROUTINE TESTING W REFLEX): HIV Screen 4th Generation wRfx: NONREACTIVE

## 2022-04-25 MED ORDER — VANCOMYCIN HCL 750 MG/150ML IV SOLN
750.0000 mg | INTRAVENOUS | Status: DC
  Filled 2022-04-25: qty 150

## 2022-04-25 MED ORDER — EZETIMIBE 10 MG PO TABS
10.0000 mg | ORAL_TABLET | Freq: Every day | ORAL | Status: DC
  Administered 2022-04-25 – 2022-05-07 (×12): 10 mg via ORAL
  Filled 2022-04-25 (×12): qty 1

## 2022-04-25 MED ORDER — PREGABALIN 25 MG PO CAPS
50.0000 mg | ORAL_CAPSULE | Freq: Three times a day (TID) | ORAL | Status: DC
  Administered 2022-04-25 – 2022-05-02 (×17): 50 mg via ORAL
  Filled 2022-04-25 (×17): qty 2

## 2022-04-25 MED ORDER — NEPRO/CARBSTEADY PO LIQD
237.0000 mL | ORAL | Status: DC
  Administered 2022-04-25 – 2022-05-05 (×6): 237 mL via ORAL

## 2022-04-25 MED ORDER — VANCOMYCIN HCL IN DEXTROSE 1-5 GM/200ML-% IV SOLN
INTRAVENOUS | Status: AC
  Administered 2022-04-25: 1000 mg via INTRAVENOUS
  Filled 2022-04-25: qty 200

## 2022-04-25 MED ORDER — INSULIN ASPART 100 UNIT/ML IJ SOLN
6.0000 [IU] | Freq: Three times a day (TID) | INTRAMUSCULAR | Status: DC
  Administered 2022-04-25 (×2): 6 [IU] via SUBCUTANEOUS

## 2022-04-25 MED ORDER — SERTRALINE HCL 50 MG PO TABS
50.0000 mg | ORAL_TABLET | Freq: Every day | ORAL | Status: DC
  Administered 2022-04-25 – 2022-05-07 (×12): 50 mg via ORAL
  Filled 2022-04-25 (×12): qty 1

## 2022-04-25 MED ORDER — RENA-VITE PO TABS
1.0000 | ORAL_TABLET | Freq: Every day | ORAL | Status: DC
  Administered 2022-04-25 – 2022-05-06 (×12): 1 via ORAL
  Filled 2022-04-25 (×12): qty 1

## 2022-04-25 MED ORDER — PROSOURCE PLUS PO LIQD
30.0000 mL | Freq: Two times a day (BID) | ORAL | Status: DC
  Administered 2022-04-27: 30 mL via ORAL
  Filled 2022-04-25 (×4): qty 30

## 2022-04-25 MED ORDER — METOPROLOL SUCCINATE ER 50 MG PO TB24
50.0000 mg | ORAL_TABLET | Freq: Every day | ORAL | Status: DC
  Administered 2022-04-25 – 2022-05-07 (×12): 50 mg via ORAL
  Filled 2022-04-25 (×12): qty 1

## 2022-04-25 MED ORDER — ACETAMINOPHEN 650 MG RE SUPP: 650.0000 mg | Freq: Four times a day (QID) | RECTAL | Status: DC | PRN

## 2022-04-25 MED ORDER — METRONIDAZOLE 500 MG PO TABS
500.0000 mg | ORAL_TABLET | Freq: Two times a day (BID) | ORAL | Status: AC
  Administered 2022-04-25 – 2022-05-01 (×14): 500 mg via ORAL
  Filled 2022-04-25 (×15): qty 1

## 2022-04-25 MED ORDER — ONDANSETRON HCL 4 MG/2ML IJ SOLN
4.0000 mg | Freq: Four times a day (QID) | INTRAMUSCULAR | Status: DC | PRN
  Administered 2022-04-25 – 2022-04-27 (×3): 4 mg via INTRAVENOUS
  Filled 2022-04-25 (×3): qty 2

## 2022-04-25 MED ORDER — DULOXETINE HCL 60 MG PO CPEP
60.0000 mg | ORAL_CAPSULE | Freq: Every day | ORAL | Status: DC
  Administered 2022-04-25 – 2022-05-07 (×12): 60 mg via ORAL
  Filled 2022-04-25 (×12): qty 1

## 2022-04-25 MED ORDER — HYDROMORPHONE HCL 2 MG PO TABS
1.0000 mg | ORAL_TABLET | Freq: Once | ORAL | Status: AC
  Administered 2022-04-25: 1 mg via ORAL
  Filled 2022-04-25: qty 1

## 2022-04-25 MED ORDER — INSULIN GLARGINE-YFGN 100 UNIT/ML ~~LOC~~ SOLN
50.0000 [IU] | Freq: Every day | SUBCUTANEOUS | Status: DC
  Filled 2022-04-25: qty 0.5

## 2022-04-25 MED ORDER — ACETAMINOPHEN 325 MG PO TABS
650.0000 mg | ORAL_TABLET | Freq: Four times a day (QID) | ORAL | Status: DC | PRN
  Administered 2022-04-25 – 2022-05-02 (×6): 650 mg via ORAL
  Filled 2022-04-25 (×7): qty 2

## 2022-04-25 MED ORDER — VANCOMYCIN HCL IN DEXTROSE 1-5 GM/200ML-% IV SOLN
1000.0000 mg | INTRAVENOUS | Status: DC
  Administered 2022-04-27 – 2022-05-01 (×3): 1000 mg via INTRAVENOUS
  Filled 2022-04-25 (×6): qty 200

## 2022-04-25 MED ORDER — HEPARIN SODIUM (PORCINE) 5000 UNIT/ML IJ SOLN
5000.0000 [IU] | Freq: Three times a day (TID) | INTRAMUSCULAR | Status: DC
  Administered 2022-04-25 – 2022-05-04 (×28): 5000 [IU] via SUBCUTANEOUS
  Filled 2022-04-25 (×26): qty 1

## 2022-04-25 MED ORDER — INSULIN GLARGINE-YFGN 100 UNIT/ML ~~LOC~~ SOLN
40.0000 [IU] | Freq: Every day | SUBCUTANEOUS | Status: DC
  Administered 2022-04-25: 40 [IU] via SUBCUTANEOUS
  Filled 2022-04-25 (×2): qty 0.4

## 2022-04-25 MED ORDER — CHLORHEXIDINE GLUCONATE CLOTH 2 % EX PADS
6.0000 | MEDICATED_PAD | Freq: Every day | CUTANEOUS | Status: DC
  Administered 2022-04-25 – 2022-05-06 (×11): 6 via TOPICAL

## 2022-04-25 MED ORDER — INSULIN ASPART 100 UNIT/ML IJ SOLN
0.0000 [IU] | Freq: Three times a day (TID) | INTRAMUSCULAR | Status: DC
  Administered 2022-04-25: 4 [IU] via SUBCUTANEOUS
  Administered 2022-04-28: 7 [IU] via SUBCUTANEOUS
  Administered 2022-04-28: 4 [IU] via SUBCUTANEOUS
  Administered 2022-04-28 – 2022-04-29 (×2): 7 [IU] via SUBCUTANEOUS

## 2022-04-25 MED ORDER — SODIUM CHLORIDE 0.9 % IV SOLN
2.0000 g | Freq: Every day | INTRAVENOUS | Status: AC
  Administered 2022-04-25 – 2022-04-30 (×7): 2 g via INTRAVENOUS
  Filled 2022-04-25 (×7): qty 20

## 2022-04-25 MED ORDER — ONDANSETRON HCL 4 MG PO TABS: 4.0000 mg | ORAL_TABLET | Freq: Four times a day (QID) | ORAL | Status: DC | PRN

## 2022-04-25 MED ORDER — VANCOMYCIN HCL IN DEXTROSE 1-5 GM/200ML-% IV SOLN
1000.0000 mg | Freq: Once | INTRAVENOUS | Status: AC
  Filled 2022-04-25: qty 200

## 2022-04-25 NOTE — Assessment & Plan Note (Addendum)
ABI 2/3 with severe disease, right 0.4 and Left 0.42 Angiogram 2/8 with diffuse disease -Appreciate vascular surgery's assistance - Continue Zetia - Intolerant of statins - Start aspirin  _ See above

## 2022-04-25 NOTE — Assessment & Plan Note (Addendum)
-   Nephrology has been consulted. _ Pt on T, Th, Sa HD schedule.

## 2022-04-25 NOTE — Assessment & Plan Note (Deleted)
LE wound pathway Empiric cefepime, flagyl, vanc Wound care consult Needs ortho vs podiatry consult in AM ABI No osteo per OSH X Ray.

## 2022-04-25 NOTE — Assessment & Plan Note (Signed)
-   Continue Cymbalta and sertraline, and Lyrica

## 2022-04-25 NOTE — Progress Notes (Signed)
Initial Nutrition Assessment  DOCUMENTATION CODES:   Not applicable  INTERVENTION:   - Trial Nepro Shake po daily, each supplement provides 425 kcal and 19 grams protein  - Trial PROSource Plus 30 ml po BID, each supplement provides 100 kcal and 15 grams of protein  - Recommend vitamin C 250 mg BID and zinc sulfate 220 mg daily to promote wound healing; reached out to MD and awaiting response  - Renal MVI daily  NUTRITION DIAGNOSIS:   Increased nutrient needs related to wound healing as evidenced by estimated needs.  GOAL:   Patient will meet greater than or equal to 90% of their needs  MONITOR:   PO intake, Supplement acceptance, Labs, Weight trends, Skin, I & O's  REASON FOR ASSESSMENT:   Consult Wound healing  ASSESSMENT:   67 year old female who presented to [facility] on [facility] from OSH with toe infection. PMH of ESRD on HD (formerly on PD), T2DM, anemia, depression, HTN. Pt admitted with dry gangrene R great toe.  RD working remotely.  Noted plan for RLE angiogram with possible intervention on Tuesday. Orthopedics following. Per notes, pt will require amputation of the R great toe at a minimum.  Noted pt with an episode of emesis this AM (bile, clear, mucous per documentation).  Spoke with pt via phone call to room. Pt reports feeling confused and handed phone over to her son. Pt's son reports that pt has not yet had anything to eat today. He reports pt had a decreased appetite yesterday but prior to that was eating fine. Pt typically eats 2-3 meals daily per son.  Pt's son reports that pt's weight has been stable lately. Unsure of UBW. Reviewed weight history in chart. Pt's weight has fluctuated between 84-93 kg over the last 1 year. Current EDW listed as 88.5 kg. Pt was below EDW on admission. Suspect true dry weight loss.  Pt and son wondering where lunch tray is. Checked Health[employer] software; lunch tray has left the kitchen.  RD to order oral nutrition supplements to  aid pt in meeting increased kcal and protein needs to promote wound healing. Will also order daily renal MVI.  Reached out to MD regarding zinc and vitamin C for wound healing. Awaiting response.  EDW: 88.5 kg Admit weight: 87.2 kg Current weight: 88.5 kg  Medications reviewed and include: SSI, novolog 6 units TID with meals, semglee 40 units daily, IV abx  Labs reviewed: phosphorus 5.9, WBC 30.2, hemoglobin A1C 9.3 on 04/16/22 CBG's: 133-175  NUTRITION - FOCUSED PHYSICAL EXAM:  Unable to complete at this time. RD working remotely.  Diet Order:   Diet Order             Diet renal/carb modified with fluid restriction Diet-HS Snack? Nothing; Fluid restriction: 1200 mL Fluid; Room service appropriate? Yes; Fluid consistency: Thin  Diet effective now                   EDUCATION NEEDS:   Not appropriate for education at this time  Skin:  Skin Assessment: Skin Integrity Issues: Diabetic Ulcer: R great toe  Last BM:  04/25/22 medium type 2  Height:   Ht Readings from Last 1 Encounters:  04/24/22 '5\' 4"'$  (1.626 m)    Weight:   Wt Readings from Last 1 Encounters:  04/25/22 88.5 kg    Ideal Body Weight:  54.5 kg  BMI:  Body mass index is 33.47 kg/m.  Estimated Nutritional Needs:   Kcal:  1800-2000  Protein:  100-115  grams  Fluid:  1000 ml + UOP    Gustavus Bryant, MS, RD, LDN Inpatient Clinical Dietitian Please see [facility] for contact information.

## 2022-04-25 NOTE — Hospital Course (Addendum)
Kathryn Beck is a 67 y.o. F with ESRD on HD MWF, DM, HTN, OSA not on CPAP, obesity, and depression who presented with nonhealing great toe wound.    On 05/04/2022 the patient underwent   1.  Ultrasound-guided micropuncture access of the left common femoral artery 2.  Second-order cannulation, right lower extremity angiogram. 3.  Drug-coated balloon angioplasty of the superficial femoral artery 5 x 100, 5 x 60 mm 4.  Drug-eluting stenting of the superficial femoral artery 6 x 185m 5.  Tibial artery angioplasty-posterior tibial artery 2 x 220 mm  With Dr. RVirl Cageyfor critical limb ischemia with tissue loss of the first great toe.   She has had successful resolutio of multiple, tandem, flow-limiting stenosis throughout the SFA and above-knee popliteal artery. 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 The posterior tibial artery was balloon angioplastied to improve distal flow, but it remains unlikely that it will be sufficient to heal a toe amputation.  05/05/2022: The determination that an amputation of the toe would have a high risk of not healing due to vascular reasons. The patient likely will end up with a BKA or AKA per vascular surgery. However, Dr. DSharol Givenhas given the patient the option of a first ray amputation, even though it also is unlikely to heal, just to try to salvage the leg. The patient and her family are considering. The surgery could take place as soon as Wednesday or Friday. I will keep the patient NPO after midnight incase she decides in favor of surgery tomorrow.

## 2022-04-25 NOTE — Assessment & Plan Note (Signed)
-   Continue Zetia - Not on statin due to history of elevated LFTs - Follow-up lipid profile

## 2022-04-25 NOTE — Progress Notes (Signed)
VASCULAR LAB    ABI has been performed.  See CV proc for preliminary results.   Hudsen Fei, RVT 04/25/2022, 11:06 AM

## 2022-04-25 NOTE — Consult Note (Addendum)
Reason for Consult: ESRD Referring Physician:  Dr. Dwyane Dee  Chief Complaint: Toe infection  Dialysis orders [facility] MWF 3hr 45 EDW 88.5kg Lt BCF 15ga Nipro 15 Elisio 400/500  2/2.5 UFP2 Calcitriol 2.15mg  Sensipar '90mg'$    Mircera 75 q4 weeks  Assessment/Plan: ESRD - formerly on PD but now on [facility]HD MWF [facility] today and then MWF next week to get back on schedule. 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 Non due for Mircera yet. Transfuse as needed. 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 HTN - controlled DM - per primary   HPI: Kathryn HAROONis an 67y.o. female DM, HTN, HLD, OSA ([place] w/ CPAP), [facility] Her last dialysis treatment was on Wednesday and she missed her treatment on Friday. Patient is here with worsening gangrene of the right great toe which she states has been declining over the past 2 weeks. She denies fever, chills, nausea, myalgias, chest pain or shortness of breath. Patient has had a nonproductive cough. CXR showed pulmonary edema, WBC 28K Tm100.6.   ROS Pertinent items are noted in HPI.  Chemistry and CBC: Creatinine  Date/Time Value Ref Range Status  07/17/2019 11:57 AM 8.1 (A) 0.5 - 1.1 Final   Creat  Date/Time Value Ref Range Status  08/23/2017 02:07 PM 3.74 (H) 0.50 - 0.99 mg/dL Final    Comment:    For patients >452years of age, the reference limit for Creatinine is approximately 13% higher for people identified as African-American. .Marland Kitchen  11/25/2016 08:02 AM 2.63 (H) 0.50 - 0.99 mg/dL Final    Comment:    For patients >473years of age, the reference limit for Creatinine is approximately 13% higher for people identified as African-American. .Marland Kitchen  11/16/2016 12:23 PM 2.39 (H) 0.50 - 0.99 mg/dL  Final    Comment:      For patients > or = 5108years of age: The upper reference limit for Creatinine is approximately 13% higher for people identified as African-American.     06/09/2016 02:50 PM 2.43 (H) 0.50 - 0.99 mg/dL Final    Comment:      For patients > or = 66years of age: The upper reference limit for Creatinine is approximately 13% higher for people identified as African-American.     02/28/2016 11:14 AM 1.91 (H) 0.50 - 1.05 mg/dL Final    Comment:      For patients > or = 66years of age: The upper reference limit for Creatinine is approximately 13% higher for people identified as African-American.     01/23/2016 11:08 AM 2.51 (H) 0.50 - 1.05 mg/dL Final    Comment:      For patients > or = 66years of age: The upper reference limit for Creatinine is approximately 13% higher for people identified as African-American.     01/14/2016 10:33 AM 1.94 (H) 0.50 - 1.05 mg/dL Final    Comment:      For patients > or = 66years of age: The upper reference limit for Creatinine is approximately 13% higher for people identified as African-American.     10/07/2015 02:25 PM 2.14 (H) 0.50 - 1.05 mg/dL Final    Comment:  For patients > or = 66 years of age: The upper reference limit for Creatinine is approximately 13% higher for people identified as African-American.     07/23/2015 11:15 AM 1.37 (H) 0.50 - 1.05 mg/dL Final  04/25/2015 04:16 PM 1.97 (H) 0.50 - 1.05 mg/dL Final  10/02/2014 11:43 AM 1.60 (H) 0.50 - 1.10 mg/dL Final  05/04/2014 08:29 AM 1.39 (H) 0.50 - 1.10 mg/dL Final  10/24/2013 09:55 AM 1.41 (H) 0.50 - 1.10 mg/dL Final  09/13/2013 10:34 AM 1.59 (H) 0.50 - 1.10 mg/dL Final  08/03/2013 03:49 PM 1.22 (H) 0.50 - 1.10 mg/dL Final  03/31/2013 10:36 AM 1.18 (H) 0.50 - 1.10 mg/dL Final  12/20/2012 01:26 PM 1.25 (H) 0.50 - 1.10 mg/dL Final  09/29/2012 04:14 PM 1.05 0.50 - 1.10 mg/dL Final  08/19/2012 11:25 AM 1.08 0.50 - 1.10 mg/dL Final  03/02/2012  11:21 AM 1.14 (H) 0.50 - 1.10 mg/dL Final  03/01/2012 10:40 AM 1.09 0.50 - 1.10 mg/dL Final  10/19/2011 11:07 AM 1.15 (H) 0.50 - 1.10 mg/dL Final  12/02/2010 08:51 AM 0.89 0.50 - 1.10 mg/dL Final  11/25/2010 09:22 AM 1.75 (H) 0.50 - 1.10 mg/dL Final  08/19/2010 11:35 AM 0.97 0.40 - 1.20 mg/dL Final   Creatinine, Ser  Date/Time Value Ref Range Status  04/25/2022 12:49 AM 8.15 (H) 0.44 - 1.00 mg/dL Final  04/14/2022 09:34 PM 10.33 (H) 0.44 - 1.00 mg/dL Final  10/02/2021 08:05 AM 7.08 (H) 0.44 - 1.00 mg/dL Final  09/05/2021 10:08 AM 4.38 (H) 0.44 - 1.00 mg/dL Final  07/06/2021 09:35 AM 5.06 (H) 0.44 - 1.00 mg/dL Final  06/10/2021 10:42 AM 6.52 (H) 0.44 - 1.00 mg/dL Final  12/02/2020 05:54 PM 10.13 (H) 0.44 - 1.00 mg/dL Final  11/05/2020 10:15 AM 7.69 (H) 0.44 - 1.00 mg/dL Final  07/19/2019 12:10 PM 5.96 (H) 0.44 - 1.00 mg/dL Final  04/07/2019 06:01 AM 7.68 (H) 0.44 - 1.00 mg/dL Final  04/06/2019 06:06 AM 5.66 (H) 0.44 - 1.00 mg/dL Final  04/05/2019 06:47 AM 7.94 (H) 0.44 - 1.00 mg/dL Final  04/04/2019 05:03 AM 5.90 (H) 0.44 - 1.00 mg/dL Final  04/03/2019 03:51 AM 8.45 (H) 0.44 - 1.00 mg/dL Final  04/02/2019 08:31 PM 7.70 (H) 0.44 - 1.00 mg/dL Final  03/28/2019 05:12 PM 5.71 (H) 0.44 - 1.00 mg/dL Final  02/23/2019 10:20 AM 4.06 (H) 0.44 - 1.00 mg/dL Final  12/29/2018 08:17 AM 4.56 (H) 0.44 - 1.00 mg/dL Final  12/05/2018 06:13 PM 3.93 (H) 0.44 - 1.00 mg/dL Final  09/12/2018 09:15 AM 5.09 (H) 0.44 - 1.00 mg/dL Final  09/03/2018 08:10 AM 6.00 (H) 0.44 - 1.00 mg/dL Final  04/22/2018 09:16 AM 3.49 (H) 0.44 - 1.00 mg/dL Final  02/06/2018 10:07 AM 4.69 (H) 0.44 - 1.00 mg/dL Final  01/17/2018 09:34 AM 4.64 (H) 0.44 - 1.00 mg/dL Final  11/26/2017 10:03 AM 3.10 (H) 0.44 - 1.00 mg/dL Final  11/18/2017 11:09 AM 2.88 (H) 0.44 - 1.00 mg/dL Final  11/07/2017 10:56 PM 3.32 (H) 0.44 - 1.00 mg/dL Final  10/15/2017 12:58 PM 2.98 (H) 0.44 - 1.00 mg/dL Final  09/27/2017 08:33 AM 3.17 (H) 0.44 - 1.00  mg/dL Final  09/23/2017 04:51 AM 3.95 (H) 0.44 - 1.00 mg/dL Final  09/22/2017 04:36 AM 4.30 (H) 0.44 - 1.00 mg/dL Final  09/21/2017 04:51 AM 4.99 (H) 0.44 - 1.00 mg/dL Final  09/20/2017 04:50 AM 6.45 (H) 0.44 - 1.00 mg/dL Final  09/19/2017 11:44 AM 7.02 (H) 0.44 - 1.00 mg/dL Final  09/17/2017 07:33 AM 7.07 (  H) 0.44 - 1.00 mg/dL Final  09/16/2017 03:03 PM 7.03 (H) 0.44 - 1.00 mg/dL Final  09/13/2017 07:10 AM 5.89 (H) 0.44 - 1.00 mg/dL Final  09/08/2017 04:54 AM 4.33 (H) 0.44 - 1.00 mg/dL Final  09/07/2017 04:29 AM 4.10 (H) 0.44 - 1.00 mg/dL Final  09/06/2017 04:50 AM 4.04 (H) 0.44 - 1.00 mg/dL Final  09/05/2017 06:52 AM 4.22 (H) 0.44 - 1.00 mg/dL Final  09/04/2017 08:00 AM 4.22 (H) 0.44 - 1.00 mg/dL Final  09/03/2017 05:34 AM 4.63 (H) 0.44 - 1.00 mg/dL Final  09/02/2017 06:28 AM 4.61 (H) 0.44 - 1.00 mg/dL Final  09/01/2017 05:42 AM 4.45 (H) 0.44 - 1.00 mg/dL Final  08/31/2017 01:52 PM 4.00 (H) 0.44 - 1.00 mg/dL Final  08/31/2017 01:43 PM 4.32 (H) 0.44 - 1.00 mg/dL Final  08/20/2017 08:25 AM 3.28 (H) 0.44 - 1.00 mg/dL Final  08/19/2017 06:06 AM 3.49 (H) 0.44 - 1.00 mg/dL Final  08/18/2017 12:10 PM 3.73 (H) 0.44 - 1.00 mg/dL Final  08/15/2017 10:18 AM 2.80 (H) 0.44 - 1.00 mg/dL Final  04/28/2017 07:53 AM 2.91 (H) 0.44 - 1.00 mg/dL Final   Recent Labs  Lab 04/25/22 0049  NA 142  K 4.6  CL 102  CO2 23  GLUCOSE 142*  BUN 64*  CREATININE 8.15*  CALCIUM 8.6*  PHOS 5.9*   Recent Labs  Lab 04/25/22 0049  WBC 30.2*  NEUTROABS 27.8*  HGB 10.2*  HCT 32.1*  MCV 78.7*  PLT 291   Liver Function Tests: Recent Labs  Lab 04/25/22 0049  AST 25  ALT 37  ALKPHOS 70  BILITOT 0.6  PROT 5.8*  ALBUMIN 2.5*   No results for input(s): "LIPASE", "AMYLASE" in the last 168 hours. No results for input(s): "AMMONIA" in the last 168 hours. Cardiac Enzymes: No results for input(s): "CKTOTAL", "CKMB", "CKMBINDEX", "TROPONINI" in the last 168 hours. Iron Studies: No results for input(s):  "IRON", "TIBC", "TRANSFERRIN", "FERRITIN" in the last 72 hours. PT/INR: '@LABRCNTIP'$ (inr:5)  Xrays/Other Studies: ) Results for orders placed or performed during the hospital encounter of 04/24/22 (from the past 48 hour(s))  Glucose, capillary     Status: Abnormal   Collection Time: 04/24/22  9:51 PM  Result Value Ref Range   Glucose-Capillary 140 (H) 70 - 99 mg/dL    Comment: Glucose reference range applies only to samples taken after fasting for at least 8 hours.   Comment 1 Notify RN    Comment 2 Document in Chart   Sedimentation rate     Status: Abnormal   Collection Time: 04/25/22 12:49 AM  Result Value Ref Range   Sed Rate 37 (H) 0 - 22 mm/hr    Comment: Performed at [facility] [address] [place], [place] [address]  C-reactive protein     Status: Abnormal   Collection Time: 04/25/22 12:49 AM  Result Value Ref Range   CRP 29.4 (H) <1.0 mg/dL    Comment: Performed at [facility] [address] [place], [place] [address]  Prealbumin     Status: None   Collection Time: 04/25/22 12:49 AM  Result Value Ref Range   Prealbumin 24 18 - 38 mg/dL    Comment: Performed at [facility] [address], [place], [place] [address]  HIV Antibody (routine testing w rflx)     Status: None   Collection Time: 04/25/22 12:49 AM  Result Value Ref Range   HIV Screen 4th Generation wRfx Non Reactive Non Reactive  Comment: Performed at [facility], [place] [address], [place], [place] [address]  CBC with Differential/Platelet     Status: Abnormal   Collection Time: 04/25/22 12:49 AM  Result Value Ref Range   WBC 30.2 (H) 4.0 - 10.5 K/uL   RBC 4.08 3.87 - 5.11 MIL/uL   Hemoglobin 10.2 (L) 12.0 - 15.0 g/dL   HCT 32.1 (L) 36.0 - 46.0 %   MCV 78.7 (L) 80.0 - 100.0 fL   MCH 25.0 (L) 26.0 - 34.0 pg   MCHC 31.8 30.0 - 36.0 g/dL   RDW 16.9 (H) 11.5 - 15.5 %   Platelets 291 150 - 400 K/uL   nRBC 0.1 0.0 - 0.2 %   Neutrophils Relative % 92 %   Neutro Abs 27.8 (H)  1.7 - 7.7 K/uL   Lymphocytes Relative 4 %   Lymphs Abs 1.2 0.7 - 4.0 K/uL   Monocytes Relative 4 %   Monocytes Absolute 1.2 (H) 0.1 - 1.0 K/uL   Eosinophils Relative 0 %   Eosinophils Absolute 0.0 0.0 - 0.5 K/uL   Basophils Relative 0 %   Basophils Absolute 0.0 0.0 - 0.1 K/uL   nRBC 1 (H) 0 /100 WBC   Abs Immature Granulocytes 0.00 0.00 - 0.07 K/uL   Polychromasia PRESENT     Comment: Performed at [facility], [place] [address], [place], [place] [address]  Comprehensive metabolic panel     Status: Abnormal   Collection Time: 04/25/22 12:49 AM  Result Value Ref Range   Sodium 142 135 - 145 mmol/L   Potassium 4.6 3.5 - 5.1 mmol/L   Chloride 102 98 - 111 mmol/L   CO2 23 22 - 32 mmol/L   Glucose, Bld 142 (H) 70 - 99 mg/dL    Comment: Glucose reference range applies only to samples taken after fasting for at least 8 hours.   BUN 64 (H) 8 - 23 mg/dL   Creatinine, Ser 8.15 (H) 0.44 - 1.00 mg/dL   Calcium 8.6 (L) 8.9 - 10.3 mg/dL   Total Protein 5.8 (L) 6.5 - 8.1 g/dL   Albumin 2.5 (L) 3.5 - 5.0 g/dL   AST 25 15 - 41 U/L   ALT 37 0 - 44 U/L   Alkaline Phosphatase 70 38 - 126 U/L   Total Bilirubin 0.6 0.3 - 1.2 mg/dL   GFR, Estimated 5 (L) >60 mL/min    Comment: (NOTE) Calculated using the CKD-EPI Creatinine Equation (2021)    Anion gap 17 (H) 5 - 15    Comment: Performed at [facility], [place] [address] [place], [place] [address]  Magnesium     Status: None   Collection Time: 04/25/22 12:49 AM  Result Value Ref Range   Magnesium 1.7 1.7 - 2.4 mg/dL    Comment: Performed at [facility] [address], [place], [place] [address]  Phosphorus     Status: Abnormal   Collection Time: 04/25/22 12:49 AM  Result Value Ref Range   Phosphorus 5.9 (H) 2.5 - 4.6 mg/dL    Comment: Performed at [facility] [address] [place], [place] [address]   *Note: Due to a large number of results and/or encounters for the requested time period, some results have not been  displayed. A complete set of results can be found in Results Review.   No results found.  PMH:   Past Medical History:  Diagnosis Date   Acid reflux  Anemia    Arthritis    Axillary masses    Soft tissue - status post excision   Back pain    COVID-19 virus infection 04/06/2019   Depression    End-stage renal disease (HCC)    [facility]Sat  in [place] hypertension    History of blood transfusion    History of cardiac catheterization    Normal coronary arteries October 2020   History of claustrophobia    History of pneumonia 2019   Hypoxia 04/03/2019   Mixed hyperlipidemia    Obesity    Pancreatitis    Peritoneal dialysis catheter in place [facility])    Pneumonia due to COVID-19 virus 04/02/2019   Sleep apnea    Noncompliant with CPAP   Type 2 diabetes mellitus (HCC)     PSH:   Past Surgical History:  Procedure Laterality Date   ABDOMINAL HYSTERECTOMY     AV FISTULA PLACEMENT Left 09/02/2017   Procedure: creation of left arm ARTERIOVENOUS (AV) FISTULA;  Surgeon: Serafina Mitchell, MD;  Location: [facility];  Service: Vascular;  Laterality: Left;   COLONOSCOPY  2008   Dr. Oneida Alar: normal    COLONOSCOPY N/A 12/18/2016   Dr. Oneida Alar: multiple tubular adenomas, internal hemorrhoids. Surveillance in 3 years    ESOPHAGEAL DILATION N/A 10/13/2015   Procedure: ESOPHAGEAL DILATION;  Surgeon: Rogene Houston, MD;  [facility];  Service: Endoscopy;  Laterality: N/A;   ESOPHAGOGASTRODUODENOSCOPY N/A 10/13/2015   Dr. Laural Golden: chronic gastritis on path, no H.pylori. Empiric dilation    ESOPHAGOGASTRODUODENOSCOPY N/A 12/18/2016   Dr. Oneida Alar: mild gastritis. BRAVO study revealed uncontrolled GERD. Dysphagia secondary to uncontrolled reflux   FOOT SURGERY Bilateral    "nerve"     LEFT HEART CATH AND CORONARY ANGIOGRAPHY N/A 12/29/2018   Procedure: LEFT HEART CATH AND CORONARY ANGIOGRAPHY;  Surgeon: Jettie Booze, MD;  Location: [facility];  Service: Cardiovascular;   Laterality: N/A;   LUNG BIOPSY     MASS EXCISION Right 01/09/2013   Procedure: EXCISION OF NEOPLASM OF RIGHT  AXILLA  AND EXCISION OF NEOPLASM OF LEFT AXILLA;  Surgeon: Jamesetta So, MD;  Location[facility];  Service: General;  Laterality: Right;  procedure end @ 08:23   MYRINGOTOMY WITH TUBE PLACEMENT Bilateral 04/28/2017   Procedure: BILATERAL MYRINGOTOMY WITH TUBE PLACEMENT;  Surgeon: Leta Baptist, MD;  Location: [facility];  Service: ENT;  Laterality: Bilateral;   REVISION OF ARTERIOVENOUS GORETEX GRAFT Left 05/04/2018   Procedure: TRANSPOSITION OF CEPHALIC VEIN ARTERIOVENOUS FISTULA LEFT ARM;  Surgeon: Rosetta Posner, MD;  Location: [facility];  Service: Vascular;  Laterality: Left;   SAVORY DILATION N/A 12/18/2016   Procedure: SAVORY DILATION;  Surgeon: Danie Binder, MD;  [facility];  Service: Endoscopy;  Laterality: N/A;    Allergies:  Allergies  Allergen Reactions   Ace Inhibitors Anaphylaxis and Swelling   Penicillins Itching, Swelling and Other (See Comments)    Did it involve swelling of the face/tongue/throat, SOB, or low BP? Unknown Did it involve sudden or severe rash/hives, skin peeling, or any reaction on the inside of your mouth or nose? Unknown Did you need to seek medical attention at a hospital or doctor's office? Unknown When did it last happen?      years  If all above answers are "NO", may proceed with cephalosporin use.    Statins Other (See Comments)    elevated LFT's     Albuterol Swelling  Medications:   Prior to Admission medications   Medication Sig Start Date End Date Taking? Authorizing Provider  amLODipine (NORVASC) 10 MG tablet Take 5 mg by mouth 2 (two) times daily.    [provider]  aspirin 81 MG chewable tablet CHEW 2 TABLETS BY MOUTH ONCE DAILY 07/25/21   Fayrene Helper, MD  B Complex-C-Folic Acid (RENA-VITE RX) 1 MG TABS Take 1 tablet by mouth daily. 04/15/22   [provider]  Blood Glucose Monitoring Suppl (ONETOUCH  VERIO) w/Device KIT Use to check blood sugar 4X daily. 04/16/22   [name], MD  calcitRIOL (ROCALTROL) 0.25 MCG capsule TAKE 1 CAPSULE (0.25 MCG TOTAL) BY MOUTH 2 (TWO) TIMES DAILY WITH A MEAL. Patient not taking: Reported on 04/21/2022 11/13/21   Cassandria Anger, MD  ciprofloxacin-dexamethasone (CIPRODEX) OTIC suspension Place 4 drops into both ears 2 (two) times daily. Patient not taking: Reported on 04/21/2022 02/05/22   Fayrene Helper, MD  cloNIDine (CATAPRES) 0.2 MG tablet Take 1 tablet (0.2 mg total) by mouth 2 (two) times daily. 02/05/22   Fayrene Helper, MD  Continuous Blood Gluc Receiver (FREESTYLE LIBRE 2 READER) DEVI 1 each by Does not apply route daily. 04/16/22   [name], MD  Continuous Blood Gluc Sensor (FREESTYLE LIBRE 2 SENSOR) MISC 1 each by Does not apply route every 14 (fourteen) days. 04/16/22   [name], MD  doxycycline (VIBRA-TABS) 100 MG tablet Take 1 tablet (100 mg total) by mouth 2 (two) times daily. 04/21/22   Fayrene Helper, MD  DULoxetine (CYMBALTA) 60 MG capsule TAKE (1) CAPSULE BY MOUTH ONCE DAILY. Patient taking differently: Take 60 mg by mouth daily. 09/17/20   Fayrene Helper, MD  ezetimibe (ZETIA) 10 MG tablet TAKE ONE TABLET BY MOUTH EVERY DAY 02/06/22   Fayrene Helper, MD  furosemide (LASIX) 80 MG tablet Take 80 mg by mouth 2 (two) times daily. 04/20/22   [provider]  glucose blood (ONETOUCH VERIO) test strip Use as instructed to check blood sugar 4X daily. 04/16/22   [name], MD  insulin aspart (FIASP FLEXTOUCH) 100 UNIT/ML FlexTouch Pen Inject 18-30 Units into the skin with breakfast, with lunch, and with evening meal. 10/28/21   [name], MD  insulin glargine, 2 Unit Dial, (TOUJEO MAX SOLOSTAR) 300 UNIT/ML Solostar Pen Inject 80 Units into the skin daily. 10/28/21   [name], MD  Insulin Pen Needle 32G X 4 MM MISC Use 4x a day 03/02/22   [name], MD  isosorbide  mononitrate (IMDUR) 30 MG 24 hr tablet TAKE ONE TABLET BY MOUTH EVERY DAY 02/06/22   Fayrene Helper, MD  metoprolol succinate (TOPROL-XL) 50 MG 24 hr tablet TAKE (1) TABLET BY MOUTH DAILY WITH FOOD *TAKE AFTER DIALYSIS* Patient not taking: Reported on 04/21/2022 07/29/21   Fayrene Helper, MD  OneTouch Delica Lancets 16X MISC Use to check blood sugar 4X daily. 04/16/22   [name], MD  pregabalin (LYRICA) 50 MG capsule TAKE ONE CAPSULE BY MOUTH three times a day 02/10/22   Sanjuana Kava, MD  sertraline (ZOLOFT) 100 MG tablet Take 1 tablet (100 mg total) by mouth daily. 07/29/21   Fayrene Helper, MD  [name] 500 MG chewable tablet Chew by mouth. 04/15/22   [provider]  FLUoxetine (PROZAC) 10 MG capsule Take 10 mg by mouth daily.    05/28/11  [provider]  glipiZIDE (GLUCOTROL) 10 MG tablet Take 10 mg by mouth 2 (two) times  daily before a meal.    05/28/11  [provider]    Discontinued Meds:   Medications Discontinued During This Encounter  Medication Reason   insulin glargine-yfgn (SEMGLEE) injection 50 Units     Social History:  reports that she has never smoked. She has never used smokeless tobacco. She reports that she does not drink alcohol and does not use drugs.  Family History:   Family History  Problem Relation Age of Onset   Hypertension Father    Hypercholesterolemia Father    Arthritis Father    Hypertension Sister    Hypercholesterolemia Sister    Breast cancer Sister    Hypertension Sister    Colon cancer Neg Hx    Colon polyps Neg Hx     Blood pressure 118/63, pulse 86, temperature 97.7 F (36.5 C), temperature source Oral, resp. rate 16, height '5\' 4"'$  (1.626 m), weight 88.5 kg, SpO2 96 %. General appearance: alert, cooperative, and appears stated age Head: NCAT Eyes: negative Neck: no adenopathy, no carotid bruit, no JVD, supple, symmetrical, trachea midline, and thyroid not enlarged, symmetric, no  tenderness/mass/nodules Back: symmetric, no curvature. ROM normal. No CVA tenderness. Resp: clear to auscultation bilaterally Cardio: regular rate and rhythm GI: soft, non-tender; bowel sounds normal; no masses,  no organomegaly Extremities: Rt great toe gangrene Access: lt BCF good thrill        Dwana Melena, MD 04/25/2022, 7:58 AM

## 2022-04-25 NOTE — Consult Note (Signed)
[facility] Nurse Consult Note: Reason for Consult:RGT with gangrenous changes. Requested for topical care guidance until medical consultant/specialist sees patient. Admitting MD requesting ortho vs podiatry in note entered last evening. Photodocumentation of wound provided by EDP. Wound type: neuropathic vs PAD Pressure Injury POA: N/A Measurement:N/A Wound MLJ:[id number] Drainage (amount, consistency, odor) none Periwound: with evidence of arterial insufficiency, discoloration  Dressing procedure/placement/frequency:I have provided nursing with conservative guidance of rthe care of the digit, painting the affected area with a povidone-iodine swabstick and allowing it to air-dry, then dressing with a dry dressing and securing with conform bandaging. The heels are to be floated (bilaterally). A silicone foam is to be placed to the sacrum for PI prevention.  Any orders provided by a physician or other provider will supercede those I have provided this morning.  [facility] nursing team will not follow, but will remain available to this patient, the nursing and medical teams.  Please re-consult if needed.  Thank you for inviting [place] to participate in this patient's Plan of Care.  Maudie Flakes, MSN, RN, CNS, [place], Serita Grammes, [employer], [employer] phone:  [phone]

## 2022-04-25 NOTE — Progress Notes (Signed)
Pharmacy Antibiotic Note  Kathryn Beck is a 67 y.o. female admitted on 04/24/2022 with  osteomyelitis .  Pharmacy has been consulted for vanc dosing.  Pt tx from [facility] for management of osteomyelitis. She received a dose of vanc and levaquin there yesterday.  ESRD - TTS  Plan: Vanc 1.5g IV x1 at [facility] then '750mg'$  IV TTS Ceftriaxone 2g IV q24  Height: '5\' 4"'$  (162.6 cm) Weight: 87.2 kg (192 lb 3.9 oz) IBW/kg (Calculated) : 54.7  Temp (24hrs), Avg:98.7 F (37.1 C), Min:98.7 F (37.1 C), Max:98.7 F (37.1 C)  No results for input(s): "WBC", "CREATININE", "LATICACIDVEN", "VANCOTROUGH", "VANCOPEAK", "VANCORANDOM", "GENTTROUGH", "GENTPEAK", "GENTRANDOM", "TOBRATROUGH", "TOBRAPEAK", "TOBRARND", "AMIKACINPEAK", "AMIKACINTROU", "AMIKACIN" in the last 168 hours.  Estimated Creatinine Clearance: 5.7 mL/min (A) (by C-G formula based on SCr of 10.33 mg/dL (H)).    Allergies  Allergen Reactions   Ace Inhibitors Anaphylaxis and Swelling   Penicillins Itching, Swelling and Other (See Comments)    Did it involve swelling of the face/tongue/throat, SOB, or low BP? Unknown Did it involve sudden or severe rash/hives, skin peeling, or any reaction on the inside of your mouth or nose? Unknown Did you need to seek medical attention at a hospital or doctor's office? Unknown When did it last happen?      years  If all above answers are "NO", may proceed with cephalosporin use.    Statins Other (See Comments)    elevated LFT's     Albuterol Swelling    Antimicrobials this admission: 2/2 vanc>> 2/2 levaquin x1 2/3 ceftriaxone>> Dose adjustments this admission:   Microbiology results:   Onnie Boer, PharmD, BCIDP, AAHIVP, CPP Infectious Disease Pharmacist 04/25/2022 12:30 AM

## 2022-04-25 NOTE — Consult Note (Signed)
ORTHOPAEDIC CONSULTATION  REQUESTING PHYSICIAN: Dwyane Dee, MD  Chief Complaint: Dry gangrene right great toe.  HPI: Kathryn Beck is a 67 y.o. female who presents with uncontrolled type 2 diabetes with severe protein caloric malnutrition with end-stage renal disease on dialysis with dry gangrenous changes of the right great toe.  Past Medical History:  Diagnosis Date   Acid reflux    Anemia    Arthritis    Axillary masses    Soft tissue - status post excision   Back pain    COVID-19 virus infection 04/06/2019   Depression    End-stage renal disease (HCC)    [facility]  in [place] hypertension    History of blood transfusion    History of cardiac catheterization    Normal coronary arteries October 2020   History of claustrophobia    History of pneumonia 2019   Hypoxia 04/03/2019   Mixed hyperlipidemia    Obesity    Pancreatitis    Peritoneal dialysis catheter in place [facility])    Pneumonia due to COVID-19 virus 04/02/2019   Sleep apnea    Noncompliant with CPAP   Type 2 diabetes mellitus ([place])    Past Surgical History:  Procedure Laterality Date   ABDOMINAL HYSTERECTOMY     AV FISTULA PLACEMENT Left 09/02/2017   Procedure: creation of left arm ARTERIOVENOUS (AV) FISTULA;  Surgeon: Serafina Mitchell, MD;  Location: [place];  Service: Vascular;  Laterality: Left;   COLONOSCOPY  2008   Dr. Oneida Alar: normal    COLONOSCOPY N/A 12/18/2016   Dr. Oneida Alar: multiple tubular adenomas, internal hemorrhoids. Surveillance in 3 years    ESOPHAGEAL DILATION N/A 10/13/2015   Procedure: ESOPHAGEAL DILATION;  Surgeon: Rogene Houston, MD;  [facility];  Service: Endoscopy;  Laterality: N/A;   ESOPHAGOGASTRODUODENOSCOPY N/A 10/13/2015   Dr. Laural Golden: chronic gastritis on path, no H.pylori. Empiric dilation    ESOPHAGOGASTRODUODENOSCOPY N/A 12/18/2016   Dr. Oneida Alar: mild gastritis. BRAVO study revealed uncontrolled GERD. Dysphagia secondary to uncontrolled reflux   FOOT  SURGERY Bilateral    "nerve"     LEFT HEART CATH AND CORONARY ANGIOGRAPHY N/A 12/29/2018   Procedure: LEFT HEART CATH AND CORONARY ANGIOGRAPHY;  Surgeon: Jettie Booze, MD;  Location: [place] [facility];  Service: Cardiovascular;  Laterality: N/A;   LUNG BIOPSY     MASS EXCISION Right 01/09/2013   Procedure: EXCISION OF NEOPLASM OF RIGHT  AXILLA  AND EXCISION OF NEOPLASM OF LEFT AXILLA;  Surgeon: Jamesetta So, MD;  Location: [facility];  Service: General;  Laterality: Right;  procedure end @ 08:23   MYRINGOTOMY WITH TUBE PLACEMENT Bilateral 04/28/2017   Procedure: BILATERAL MYRINGOTOMY WITH TUBE PLACEMENT;  Surgeon: Leta Baptist, MD;  Location: [place];  Service: ENT;  Laterality: Bilateral;   REVISION OF ARTERIOVENOUS GORETEX GRAFT Left 05/04/2018   Procedure: TRANSPOSITION OF CEPHALIC VEIN ARTERIOVENOUS FISTULA LEFT ARM;  Surgeon: Rosetta Posner, MD;  Location: [place];  Service: Vascular;  Laterality: Left;   SAVORY DILATION N/A 12/18/2016   Procedure: SAVORY DILATION;  Surgeon: Danie Binder, MD;  [facility];  Service: Endoscopy;  Laterality: N/A;   Social History   Socioeconomic History   Marital status: Married    Spouse name: larry    Number of children: 2   Years of education: 12   Highest education level: 12th grade  Occupational History   Occupation: retired   Tobacco Use   Smoking status:  Never   Smokeless tobacco: Never  Vaping Use   Vaping Use: Never used  Substance and Sexual Activity   Alcohol use: No   Drug use: No   Sexual activity: Not Currently  Other Topics Concern   Not on file  Social History Narrative   Lives alone with husband    Social Determinants of Health   Financial Resource Strain: High Risk (05/08/2021)   Overall Financial Resource Strain (CARDIA)    Difficulty of Paying Living Expenses: Hard  Food Insecurity: No Food Insecurity (04/24/2022)   Hunger Vital Sign    Worried About Running Out of Food in the Last Year: Never true    Ran Out  of Food in the Last Year: Never true  Transportation Needs: No Transportation Needs (04/24/2022)   PRAPARE - Hydrologist (Medical): No    Lack of Transportation (Non-Medical): No  Physical Activity: Inactive (11/20/2021)   Exercise Vital Sign    Days of Exercise per Week: 0 days    Minutes of Exercise per Session: 0 min  Stress: Stress Concern Present (11/20/2021)   [place]    Feeling of Stress : To some extent  Social Connections: Socially Integrated (05/08/2021)   Social Connection and Isolation Panel [NHANES]    Frequency of Communication with Friends and Family: More than three times a week    Frequency of Social Gatherings with Friends and Family: Once a week    Attends Religious Services: More than 4 times per year    Active Member of [employer] or Organizations: Yes    Attends Archivist Meetings: 1 to 4 times per year    Marital Status: Married   Family History  Problem Relation Age of Onset   Hypertension Father    Hypercholesterolemia Father    Arthritis Father    Hypertension Sister    Hypercholesterolemia Sister    Breast cancer Sister    Hypertension Sister    Colon cancer Neg Hx    Colon polyps Neg Hx    - negative except otherwise stated in the family history section Allergies  Allergen Reactions   Ace Inhibitors Anaphylaxis and Swelling   Penicillins Itching, Swelling and Other (See Comments)    Did it involve swelling of the face/tongue/throat, SOB, or low BP? Unknown Did it involve sudden or severe rash/hives, skin peeling, or any reaction on the inside of your mouth or nose? Unknown Did you need to seek medical attention at a hospital or doctor's office? Unknown When did it last happen?      years  If all above answers are "NO", may proceed with cephalosporin use.    Statins Other (See Comments)    elevated LFT's     Albuterol Swelling   Prior to  Admission medications   Medication Sig Start Date End Date Taking? Authorizing Provider  amLODipine (NORVASC) 10 MG tablet Take 10 mg by mouth daily.   Yes [provider]  aspirin 81 MG chewable tablet CHEW 2 TABLETS BY MOUTH ONCE DAILY Patient taking differently: Chew 162 mg by mouth daily. 07/25/21  Yes Fayrene Helper, MD  B Complex-C-Folic Acid (RENA-VITE RX) 1 MG TABS Take 1 tablet by mouth daily. 04/15/22  Yes [provider]  doxycycline (VIBRA-TABS) 100 MG tablet Take 1 tablet (100 mg total) by mouth 2 (two) times daily. 04/21/22  Yes Fayrene Helper, MD  DULoxetine (CYMBALTA) 60 MG capsule TAKE (1)  CAPSULE BY MOUTH ONCE DAILY. Patient taking differently: Take 60 mg by mouth daily. 09/17/20  Yes Fayrene Helper, MD  ezetimibe (ZETIA) 10 MG tablet TAKE ONE TABLET BY MOUTH EVERY DAY 02/06/22  Yes Fayrene Helper, MD  furosemide (LASIX) 80 MG tablet Take 80 mg by mouth 2 (two) times daily. 04/20/22  Yes [provider]  hydrOXYzine (ATARAX) 25 MG tablet Take 25 mg by mouth every 12 (twelve) hours as needed for itching.   Yes [provider]  insulin aspart (FIASP FLEXTOUCH) 100 UNIT/ML FlexTouch Pen Inject 18-30 Units into the skin with breakfast, with lunch, and with evening meal. 10/28/21  Yes [name], MD  insulin glargine, 2 Unit Dial, (TOUJEO MAX SOLOSTAR) 300 UNIT/ML Solostar Pen Inject 80 Units into the skin daily. Patient taking differently: Inject 60 Units into the skin daily. 10/28/21  Yes [name], MD  isosorbide mononitrate (IMDUR) 30 MG 24 hr tablet TAKE ONE TABLET BY MOUTH EVERY DAY 02/06/22  Yes Fayrene Helper, MD  metoprolol succinate (TOPROL-XL) 50 MG 24 hr tablet TAKE (1) TABLET BY MOUTH DAILY WITH FOOD *TAKE AFTER DIALYSIS* Patient taking differently: Take 50 mg by mouth daily. 07/29/21  Yes Fayrene Helper, MD  pregabalin (LYRICA) 50 MG capsule TAKE ONE CAPSULE BY MOUTH three times a day Patient taking  differently: Take 50 mg by mouth 3 (three) times daily. 02/10/22  Yes Sanjuana Kava, MD  sertraline (ZOLOFT) 100 MG tablet Take 1 tablet (100 mg total) by mouth daily. Patient taking differently: Take 50 mg by mouth daily. 07/29/21  Yes Fayrene Helper, MD  [name] 500 MG chewable tablet Chew 500-1,000 mg by mouth See admin instructions. Take '1000mg'$  (2 tablets) by mouth with meals and '500mg'$  (1 tablet) with snacks 04/15/22  Yes [provider]  Blood Glucose Monitoring Suppl (ONETOUCH VERIO) w/Device KIT Use to check blood sugar 4X daily. 04/16/22   [name], MD  calcitRIOL (ROCALTROL) 0.25 MCG capsule TAKE 1 CAPSULE (0.25 MCG TOTAL) BY MOUTH 2 (TWO) TIMES DAILY WITH A MEAL. Patient not taking: Reported on 04/21/2022 11/13/21   Cassandria Anger, MD  ciprofloxacin-dexamethasone (CIPRODEX) OTIC suspension Place 4 drops into both ears 2 (two) times daily. Patient not taking: Reported on 04/21/2022 02/05/22   Fayrene Helper, MD  cloNIDine (CATAPRES) 0.2 MG tablet Take 1 tablet (0.2 mg total) by mouth 2 (two) times daily. Patient not taking: Reported on 04/25/2022 02/05/22   Fayrene Helper, MD  Continuous Blood Gluc Receiver (FREESTYLE LIBRE 2 READER) DEVI 1 each by Does not apply route daily. 04/16/22   [name], MD  Continuous Blood Gluc Sensor (FREESTYLE LIBRE 2 SENSOR) MISC 1 each by Does not apply route every 14 (fourteen) days. 04/16/22   [name], MD  glucose blood (ONETOUCH VERIO) test strip Use as instructed to check blood sugar 4X daily. 04/16/22   [name], MD  Insulin Pen Needle 32G X 4 MM MISC Use 4x a day 03/02/22   [name], MD  OneTouch Delica Lancets 83M MISC Use to check blood sugar 4X daily. 04/16/22   [name], MD  FLUoxetine (PROZAC) 10 MG capsule Take 10 mg by mouth daily.    05/28/11  [provider]  glipiZIDE (GLUCOTROL) 10 MG tablet Take 10 mg by mouth 2 (two) times daily before a meal.    05/28/11   [provider]   No results found. - pertinent xrays, CT, MRI studies were reviewed and independently interpreted  Positive ROS: All other systems have been reviewed and were otherwise negative with the exception of those mentioned in the HPI and as above.  Physical Exam: General: Alert, no acute distress Psychiatric: Patient is competent for consent with normal mood and affect Lymphatic: No axillary or cervical lymphadenopathy Cardiovascular: No pedal edema Respiratory: No cyanosis, no use of accessory musculature GI: No organomegaly, abdomen is soft and non-tender    Images:  '@ENCIMAGES'$ @  Labs:  Lab Results  Component Value Date   HGBA1C 9.3 (A) 04/16/2022   HGBA1C 8.9 (A) 10/28/2021   HGBA1C 9.1 (A) 07/22/2021   ESRSEDRATE 37 (H) 04/25/2022   ESRSEDRATE 27 (H) 04/24/2008   CRP 29.4 (H) 04/25/2022   CRP 20.1 (H) 12/02/2020   CRP 6.5 (H) 04/07/2019   LABURIC 1.8 (L) 06/07/2018   LABURIC 6.2 04/22/2018   LABURIC 9.3 (H) 10/07/2015   REPTSTATUS 11/09/2017 FINAL 11/07/2017   GRAMSTAIN No WBC Seen 04/03/2014   GRAMSTAIN Rare Squamous Epithelial Cells Present 04/03/2014   GRAMSTAIN Few Gram Positive Cocci In Pairs 04/03/2014   GRAMSTAIN Few Gram Negative Rods 04/03/2014   CULT (A) 11/07/2017    <10,000 COLONIES/mL INSIGNIFICANT GROWTH Performed at [facility], [address] [place], [place] [address]    LABORGA Normal Oropharyngeal Flora 04/03/2014    Lab Results  Component Value Date   ALBUMIN 2.5 (L) 04/25/2022   ALBUMIN 3.3 (L) 04/14/2022   ALBUMIN 3.6 10/02/2021   PREALBUMIN 24 04/25/2022   LABURIC 1.8 (L) 06/07/2018   LABURIC 6.2 04/22/2018   LABURIC 9.3 (H) 10/07/2015        Latest Ref Rng & Units 04/25/2022   12:49 AM 04/14/2022    9:34 PM 10/02/2021    8:05 AM  CBC EXTENDED  WBC 4.0 - 10.5 K/uL 30.2  7.8  5.6   RBC 3.87 - 5.11 MIL/uL 4.08  4.18  4.48   Hemoglobin 12.0 - 15.0 g/dL 10.2  10.3  11.3   HCT 36.0 - 46.0 % 32.1  34.7   38.0   Platelets 150 - 400 K/uL 291  310  329   NEUT# 1.7 - 7.7 K/uL 27.8  5.7  3.5   Lymph# 0.7 - 4.0 K/uL 1.2  1.2  1.5     Neurologic: Patient does not have protective sensation bilateral lower extremities.   MUSCULOSKELETAL:   Skin: Examination patient's feet are cool to the touch bilaterally she has no venous stasis ulcers.  I cannot palpate a dorsalis pedis or posterior tibial pulse.  She has dry gangrenous changes of the right great toe.  White cell count 30.2 hemoglobin 10.2 with an albumin of 2.5 and a hemoglobin A1c of 9.3.  Assessment: Assessment: Uncontrolled type 2 diabetes with severe protein caloric malnutrition end-stage renal disease on dialysis with peripheral vascular disease with dry gangrene of the right great toe.  Plan: Plan: I will consult vascular surgery to see if there is endovascular options for the patient.  She is scheduled for ankle-brachial indices.  I will follow-up after vascular evaluation.  Thank you for the consult and the opportunity to see Ms. Heloise Beecham, MD [place] [phone] 10:45 AM

## 2022-04-25 NOTE — Assessment & Plan Note (Addendum)
A1c 9.3% Glucose here well controlled now - Continue Semglee - Continue sliding scale corrections

## 2022-04-25 NOTE — Assessment & Plan Note (Signed)
Blood pressure somewhat elevated - Continue amlodipine, metoprolol - Hold Imdur, clonidine

## 2022-04-25 NOTE — Inpatient Diabetes Management (Signed)
Inpatient Diabetes Program Recommendations  AACE/ADA: New Consensus Statement on Inpatient Glycemic Control (2015)  Target Ranges:  Prepandial:   less than 140 mg/dL      Peak postprandial:   less than 180 mg/dL (1-2 hours)      Critically ill patients:  140 - 180 mg/dL   Lab Results  Component Value Date   GLUCAP 133 (H) 04/25/2022   HGBA1C 9.3 (A) 04/16/2022    Review of Glycemic Control  Diabetes history: DM2 Outpatient Diabetes medications: Toujeo 60 QD, Fiasp 18-30 TID, glipizide 10 BID Current orders for Inpatient glycemic control: Semglee 40 QD, Novolog 0-20 units TID + 6 units TID  HgbA1C 9.3% For angiogram 3/6 Endo Cruzita Lederer MD  Inpatient Diabetes Program Recommendations:    Agree with orders.  Will see pt on 3/5 regarding her diabetes control and HgbA1C of 9.3%.  Continue to follow glucose trends.  Thank you. Lorenda Peck, RD, LDN, [place] Inpatient Diabetes Coordinator [phone]

## 2022-04-25 NOTE — Assessment & Plan Note (Addendum)
-   Continue vancomycin - Consult vascular surgery and orthopedics for aortogram and amputation - Pt underwent angiography with angioplasty today for critical limb ischemia 1.  Ultrasound-guided micropuncture access of the left common femoral artery 2.  Second-order cannulation, right lower extremity angiogram. 3.  Drug-coated balloon angioplasty of the superficial femoral artery 5 x 100, 5 x 60 mm 4.  Drug-eluting stenting of the superficial femoral artery 6 x 166m 5.  Tibial artery angioplasty-posterior tibial artery 2 x 220 mm  The determination that an amputation of the toe would have a high risk of not healing due to vascular reasons. The patient likely will end up with a BKA or AKA per vascular surgery. However, Dr. DSharol Givenhas given the patient the option of a first ray amputation, even though it also is unlikely to heal, just to try to salvage the leg. The patient and her family are considering. The surgery could take place as soon as Wednesday or Friday. I will keep the patient NPO after midnight incase she decides in favor of surgery tomorrow.

## 2022-04-25 NOTE — Assessment & Plan Note (Signed)
-   Continue Lyrica.  Database reviewed

## 2022-04-25 NOTE — Progress Notes (Signed)
Received patient in bed to unit.  Alert and oriented.  Informed consent signed and in chart.   TX duration:  Patient tolerated well.  Transported back to the room  Alert, without acute distress.  Hand-off given to patient's nurse.   Access used: [id number] Access issues: 1849  Total UF removed: 2 L Medication(s) given: Vancomycin 1 g IV Post HD VS: 122/56 P 86 R 16 O2 sat 100 % in room air. Post HD weight: 86.6 KG   [facility]

## 2022-04-25 NOTE — Progress Notes (Signed)
Progress Note    Kathryn Beck   UVO:[id number]  DOB: July 11, 1955  DOA: 04/24/2022     1 PCP: Kathryn Helper, MD  Initial CC: foot wound  Hospital Course: Kathryn Beck is a 67 yo female with PMH DMII, HTN, HLD, ESRD on HD, depression who presented with a nonhealing wound on her R great toe. She denied recent injury to her foot but states the wound has continued to worsen.  Appearance was concerning for gangrene.  She was started on antibiotics and admitted for further evaluation.  Interval History:  Seen this morning in her room with husband present bedside.  Reviewed tentative plan for infection treatment.  She was tearful at the thought of possible amputation but voiced understanding.  Assessment and Plan: Gangrene of toe of right foot ([place]) - Nonhealing wound to the right great toe, no obvious injury prior to onset.  Patient endorses wound has been present for approximately 2 to 3 weeks prior to hospitalization - With underlying diabetes and ESRD, at risk for vascular disease.  ABIs confirm severe underlying perfusion to her foot - Tentative plan for angiogram on Tuesday and eventual toe amputation if not more proximal depending on angiogram findings.  Final plan deferred to orthopedic surgery and vascular surgery - Continue vancomycin, Rocephin, and Flagyl for now  PAD (peripheral artery disease) (HCC) - ABI performed on 2/3 indicating severe disease. Right 0.4 and Left 0.42 -Vascular surgery following -Plan is for angiogram on Tuesday  ESRD on hemodialysis ([place]) - ESRD on HD MWF - Nephrology following, appreciate assistance.  Last dialysis session was on Wednesday prior to admission - Patient planning on undergoing dialysis today then resuming on Monday per nephrology  Type 2 diabetes mellitus with hyperglycemia (HCC) - A1c 9.3% on 04/16/2022 - Follows outpatient with endocrinology - Continue Semglee and sliding scale.  Will adjust as necessary  Neuropathy - Continue  Lyrica.  Database reviewed  HTN (hypertension) - Blood pressure stable - Continue Toprol - Hold amlodipine, Imdur, clonidine for now  Depression - Continue Cymbalta and Zoloft  Hyperlipidemia - Continue Zetia - Not on statin due to history of elevated LFTs - Follow-up lipid profile   Old records reviewed in assessment of this patient  Antimicrobials: Vancomycin 04/25/2022 >> current Rocephin 04/25/2022 >> current Flagyl 04/25/2022 >> current  DVT prophylaxis:  heparin injection 5,000 Units Start: 04/25/22 0115   Code Status:   Code Status: Full Code  Mobility Assessment (last 72 hours)     Mobility Assessment     Row Name 04/24/22 2200           Does patient have an order for bedrest or is patient medically unstable No - Continue assessment       What is the highest level of mobility based on the progressive mobility assessment? Level 4 (Walks with assist in room) - Balance while marching in place and cannot step forward and back - Complete                Barriers to discharge:  Disposition Plan: Home Status is: Inpatient  Objective: Blood pressure 121/64, pulse 88, temperature 98.3 F (36.8 C), temperature source Oral, resp. rate 20, height '5\' 4"'$  (1.626 m), weight 88.5 kg, SpO2 98 %.  Examination:  Physical Exam Constitutional:      Appearance: Normal appearance.  HENT:     Head: Normocephalic and atraumatic.     Mouth/Throat:     Mouth: Mucous membranes are moist.  Eyes:  Extraocular Movements: Extraocular movements intact.  Cardiovascular:     Rate and Rhythm: Normal rate and regular rhythm.  Pulmonary:     Effort: Pulmonary effort is normal. No respiratory distress.     Breath sounds: Normal breath sounds. No wheezing.  Abdominal:     General: Bowel sounds are normal. There is no distension.     Palpations: Abdomen is soft.     Tenderness: There is no abdominal tenderness.  Musculoskeletal:     Cervical back: Normal range of motion and neck  supple.     Right foot: Abnormal pulse.     Left foot: Abnormal pulse.     Comments: Right foot great toe noted with gangrene involving approx 3/4 of appendage. Unable to palpate DP in either foot  Skin:    General: Skin is warm and dry.  Neurological:     General: No focal deficit present.     Mental Status: She is alert.  Psychiatric:        Mood and Affect: Mood normal.      Consultants:  Nephrology Vascular surgery Orthopedic surgery  Procedures:    Data Reviewed: Results for orders placed or performed during the hospital encounter of 04/24/22 (from the past 24 hour(s))  Glucose, capillary     Status: Abnormal   Collection Time: 04/24/22  9:51 PM  Result Value Ref Range   Glucose-Capillary 140 (H) 70 - 99 mg/dL   Comment 1 Notify RN    Comment 2 Document in Chart   Sedimentation rate     Status: Abnormal   Collection Time: 04/25/22 12:49 AM  Result Value Ref Range   Sed Rate 37 (H) 0 - 22 mm/hr  C-reactive protein     Status: Abnormal   Collection Time: 04/25/22 12:49 AM  Result Value Ref Range   CRP 29.4 (H) <1.0 mg/dL  Prealbumin     Status: None   Collection Time: 04/25/22 12:49 AM  Result Value Ref Range   Prealbumin 24 18 - 38 mg/dL  HIV Antibody (routine testing w rflx)     Status: None   Collection Time: 04/25/22 12:49 AM  Result Value Ref Range   HIV Screen 4th Generation wRfx Non Reactive Non Reactive  CBC with Differential/Platelet     Status: Abnormal   Collection Time: 04/25/22 12:49 AM  Result Value Ref Range   WBC 30.2 (H) 4.0 - 10.5 K/uL   RBC 4.08 3.87 - 5.11 MIL/uL   Hemoglobin 10.2 (L) 12.0 - 15.0 g/dL   HCT 32.1 (L) 36.0 - 46.0 %   MCV 78.7 (L) 80.0 - 100.0 fL   MCH 25.0 (L) 26.0 - 34.0 pg   MCHC 31.8 30.0 - 36.0 g/dL   RDW 16.9 (H) 11.5 - 15.5 %   Platelets 291 150 - 400 K/uL   nRBC 0.1 0.0 - 0.2 %   Neutrophils Relative % 92 %   Neutro Abs 27.8 (H) 1.7 - 7.7 K/uL   Lymphocytes Relative 4 %   Lymphs Abs 1.2 0.7 - 4.0 K/uL    Monocytes Relative 4 %   Monocytes Absolute 1.2 (H) 0.1 - 1.0 K/uL   Eosinophils Relative 0 %   Eosinophils Absolute 0.0 0.0 - 0.5 K/uL   Basophils Relative 0 %   Basophils Absolute 0.0 0.0 - 0.1 K/uL   nRBC 1 (H) 0 /100 WBC   Abs Immature Granulocytes 0.00 0.00 - 0.07 K/uL   Polychromasia PRESENT   Comprehensive metabolic panel  Status: Abnormal   Collection Time: 04/25/22 12:49 AM  Result Value Ref Range   Sodium 142 135 - 145 mmol/L   Potassium 4.6 3.5 - 5.1 mmol/L   Chloride 102 98 - 111 mmol/L   CO2 23 22 - 32 mmol/L   Glucose, Bld 142 (H) 70 - 99 mg/dL   BUN 64 (H) 8 - 23 mg/dL   Creatinine, Ser 8.15 (H) 0.44 - 1.00 mg/dL   Calcium 8.6 (L) 8.9 - 10.3 mg/dL   Total Protein 5.8 (L) 6.5 - 8.1 g/dL   Albumin 2.5 (L) 3.5 - 5.0 g/dL   AST 25 15 - 41 U/L   ALT 37 0 - 44 U/L   Alkaline Phosphatase 70 38 - 126 U/L   Total Bilirubin 0.6 0.3 - 1.2 mg/dL   GFR, Estimated 5 (L) >60 mL/min   Anion gap 17 (H) 5 - 15  Magnesium     Status: None   Collection Time: 04/25/22 12:49 AM  Result Value Ref Range   Magnesium 1.7 1.7 - 2.4 mg/dL  Phosphorus     Status: Abnormal   Collection Time: 04/25/22 12:49 AM  Result Value Ref Range   Phosphorus 5.9 (H) 2.5 - 4.6 mg/dL  Glucose, capillary     Status: Abnormal   Collection Time: 04/25/22  8:02 AM  Result Value Ref Range   Glucose-Capillary 175 (H) 70 - 99 mg/dL   Comment 1 Notify RN    Comment 2 Document in Chart   Glucose, capillary     Status: Abnormal   Collection Time: 04/25/22 11:50 AM  Result Value Ref Range   Glucose-Capillary 133 (H) 70 - 99 mg/dL   Comment 1 Notify RN    Comment 2 Document in Chart    *Note: Due to a large number of results and/or encounters for the requested time period, some results have not been displayed. A complete set of results can be found in Results Review.    I have reviewed pertinent nursing notes, vitals, labs, and images as necessary. I have ordered labwork to follow up on as indicated.   I have reviewed the last notes from staff over past 24 hours. I have discussed patient's care plan and test results with nursing staff, CM/SW, and other staff as appropriate.  Time spent: Greater than 50% of the 55 minute visit was spent in counseling/coordination of care for the patient as laid out in the A&P.   LOS: 1 day   Dwyane Dee, MD [facility] 04/25/2022, 1:51 PM

## 2022-04-25 NOTE — Consult Note (Signed)
Hospital Consult    Reason for Consult:  Right foot critical limb ischemia with tissue loss a the great toe  MRN #:  [id number]  History of Present Illness: This is a 67 y.o. female who presented to the hospital with a right wound.  Initially, orthopedic surgery was called to manage the toe wound, vascular surgery was also consulted there was a nonpalpable pulse in the foot.   On physical exam, Kathryn Beck was resting comfortably.  She has longstanding history of diabetes, which is like end-stage renal disease, currently dialysis dependent.  Which has been present for several weeks, and has continued to progress.  She denies purulence.  Denies fevers, chills. A1C - 9   Past Medical History:  Diagnosis Date   Acid reflux    Anemia    Arthritis    Axillary masses    Soft tissue - status post excision   Back pain    COVID-19 virus infection 04/06/2019   Depression    End-stage renal disease ([place])    TTHSat  in [place] hypertension    History of blood transfusion    History of cardiac catheterization    Normal coronary arteries October 2020   History of claustrophobia    History of pneumonia 2019   Hypoxia 04/03/2019   Mixed hyperlipidemia    Obesity    Pancreatitis    Peritoneal dialysis catheter in place [facility])    Pneumonia due to COVID-19 virus 04/02/2019   Sleep apnea    Noncompliant with CPAP   Type 2 diabetes mellitus ([place])     Past Surgical History:  Procedure Laterality Date   ABDOMINAL HYSTERECTOMY     AV FISTULA PLACEMENT Left 09/02/2017   Procedure: creation of left arm ARTERIOVENOUS (AV) FISTULA;  Surgeon: Serafina Mitchell, MD;  Location: [place];  Service: Vascular;  Laterality: Left;   COLONOSCOPY  2008   Dr. Oneida Alar: normal    COLONOSCOPY N/A 12/18/2016   Dr. Oneida Alar: multiple tubular adenomas, internal hemorrhoids. Surveillance in 3 years    ESOPHAGEAL DILATION N/A 10/13/2015   Procedure: ESOPHAGEAL DILATION;  Surgeon: Rogene Houston, MD;  [facility];  Service: Endoscopy;  Laterality: N/A;   ESOPHAGOGASTRODUODENOSCOPY N/A 10/13/2015   Dr. Laural Golden: chronic gastritis on path, no H.pylori. Empiric dilation    ESOPHAGOGASTRODUODENOSCOPY N/A 12/18/2016   Dr. Oneida Alar: mild gastritis. BRAVO study revealed uncontrolled GERD. Dysphagia secondary to uncontrolled reflux   FOOT SURGERY Bilateral    "nerve"     LEFT HEART CATH AND CORONARY ANGIOGRAPHY N/A 12/29/2018   Procedure: LEFT HEART CATH AND CORONARY ANGIOGRAPHY;  Surgeon: Jettie Booze, MD;  Location: [place] [facility];  Service: Cardiovascular;  Laterality: N/A;   LUNG BIOPSY     MASS EXCISION Right 01/09/2013   Procedure: EXCISION OF NEOPLASM OF RIGHT  AXILLA  AND EXCISION OF NEOPLASM OF LEFT AXILLA;  Surgeon: Jamesetta So, MD;  Location: [facility];  Service: General;  Laterality: Right;  procedure end @ 08:23   MYRINGOTOMY WITH TUBE PLACEMENT Bilateral 04/28/2017   Procedure: BILATERAL MYRINGOTOMY WITH TUBE PLACEMENT;  Surgeon: Leta Baptist, MD;  Location: [place];  Service: ENT;  Laterality: Bilateral;   REVISION OF ARTERIOVENOUS GORETEX GRAFT Left 05/04/2018   Procedure: TRANSPOSITION OF CEPHALIC VEIN ARTERIOVENOUS FISTULA LEFT ARM;  Surgeon: Rosetta Posner, MD;  Location: [place];  Service: Vascular;  Laterality: Left;   SAVORY DILATION N/A 12/18/2016   Procedure: SAVORY DILATION;  Surgeon: Danie Binder,  MD;  [facility];  Service: Endoscopy;  Laterality: N/A;    Allergies  Allergen Reactions   Ace Inhibitors Anaphylaxis and Swelling   Penicillins Itching, Swelling and Other (See Comments)    Did it involve swelling of the face/tongue/throat, SOB, or low BP? Unknown Did it involve sudden or severe rash/hives, skin peeling, or any reaction on the inside of your mouth or nose? Unknown Did you need to seek medical attention at a hospital or doctor's office? Unknown When did it last happen?      years  If all above answers are "NO", may proceed with cephalosporin use.     Statins Other (See Comments)    elevated LFT's     Albuterol Swelling    Prior to Admission medications   Medication Sig Start Date End Date Taking? Authorizing Provider  amLODipine (NORVASC) 10 MG tablet Take 10 mg by mouth daily.   Yes [provider]  aspirin 81 MG chewable tablet CHEW 2 TABLETS BY MOUTH ONCE DAILY Patient taking differently: Chew 162 mg by mouth daily. 07/25/21  Yes Fayrene Helper, MD  B Complex-C-Folic Acid (RENA-VITE RX) 1 MG TABS Take 1 tablet by mouth daily. 04/15/22  Yes [provider]  doxycycline (VIBRA-TABS) 100 MG tablet Take 1 tablet (100 mg total) by mouth 2 (two) times daily. 04/21/22  Yes Fayrene Helper, MD  DULoxetine (CYMBALTA) 60 MG capsule TAKE (1) CAPSULE BY MOUTH ONCE DAILY. Patient taking differently: Take 60 mg by mouth daily. 09/17/20  Yes Fayrene Helper, MD  ezetimibe (ZETIA) 10 MG tablet TAKE ONE TABLET BY MOUTH EVERY DAY 02/06/22  Yes Fayrene Helper, MD  furosemide (LASIX) 80 MG tablet Take 80 mg by mouth 2 (two) times daily. 04/20/22  Yes [provider]  hydrOXYzine (ATARAX) 25 MG tablet Take 25 mg by mouth every 12 (twelve) hours as needed for itching.   Yes [provider]  insulin aspart (FIASP FLEXTOUCH) 100 UNIT/ML FlexTouch Pen Inject 18-30 Units into the skin with breakfast, with lunch, and with evening meal. 10/28/21  Yes [name], MD  insulin glargine, 2 Unit Dial, (TOUJEO MAX SOLOSTAR) 300 UNIT/ML Solostar Pen Inject 80 Units into the skin daily. Patient taking differently: Inject 60 Units into the skin daily. 10/28/21  Yes [name], MD  isosorbide mononitrate (IMDUR) 30 MG 24 hr tablet TAKE ONE TABLET BY MOUTH EVERY DAY 02/06/22  Yes Fayrene Helper, MD  metoprolol succinate (TOPROL-XL) 50 MG 24 hr tablet TAKE (1) TABLET BY MOUTH DAILY WITH FOOD *TAKE AFTER DIALYSIS* Patient taking differently: Take 50 mg by mouth daily. 07/29/21  Yes Fayrene Helper, MD   pregabalin (LYRICA) 50 MG capsule TAKE ONE CAPSULE BY MOUTH three times a day Patient taking differently: Take 50 mg by mouth 3 (three) times daily. 02/10/22  Yes Sanjuana Kava, MD  sertraline (ZOLOFT) 100 MG tablet Take 1 tablet (100 mg total) by mouth daily. Patient taking differently: Take 50 mg by mouth daily. 07/29/21  Yes Fayrene Helper, MD  [name] 500 MG chewable tablet Chew 500-1,000 mg by mouth See admin instructions. Take '1000mg'$  (2 tablets) by mouth with meals and '500mg'$  (1 tablet) with snacks 04/15/22  Yes [provider]  Blood Glucose Monitoring Suppl (ONETOUCH VERIO) w/Device KIT Use to check blood sugar 4X daily. 04/16/22   [name], MD  calcitRIOL (ROCALTROL) 0.25 MCG capsule TAKE 1 CAPSULE (0.25 MCG TOTAL) BY MOUTH 2 (TWO) TIMES DAILY WITH A MEAL. Patient  not taking: Reported on 04/21/2022 11/13/21   Cassandria Anger, MD  ciprofloxacin-dexamethasone (CIPRODEX) OTIC suspension Place 4 drops into both ears 2 (two) times daily. Patient not taking: Reported on 04/21/2022 02/05/22   Fayrene Helper, MD  cloNIDine (CATAPRES) 0.2 MG tablet Take 1 tablet (0.2 mg total) by mouth 2 (two) times daily. Patient not taking: Reported on 04/25/2022 02/05/22   Fayrene Helper, MD  Continuous Blood Gluc Receiver (FREESTYLE LIBRE 2 READER) DEVI 1 each by Does not apply route daily. 04/16/22   [name], MD  Continuous Blood Gluc Sensor (FREESTYLE LIBRE 2 SENSOR) MISC 1 each by Does not apply route every 14 (fourteen) days. 04/16/22   [name], MD  glucose blood (ONETOUCH VERIO) test strip Use as instructed to check blood sugar 4X daily. 04/16/22   [name], MD  Insulin Pen Needle 32G X 4 MM MISC Use 4x a day 03/02/22   [name], MD  OneTouch Delica Lancets 84Y MISC Use to check blood sugar 4X daily. 04/16/22   [name], MD  FLUoxetine (PROZAC) 10 MG capsule Take 10 mg by mouth daily.    05/28/11  [provider]  glipiZIDE  (GLUCOTROL) 10 MG tablet Take 10 mg by mouth 2 (two) times daily before a meal.    05/28/11  [provider]    Social History   Socioeconomic History   Marital status: Married    Spouse name: larry    Number of children: 2   Years of education: 12   Highest education level: 12th grade  Occupational History   Occupation: retired   Tobacco Use   Smoking status: Never   Smokeless tobacco: Never  Vaping Use   Vaping Use: Never used  Substance and Sexual Activity   Alcohol use: No   Drug use: No   Sexual activity: Not Currently  Other Topics Concern   Not on file  Social History Narrative   Lives alone with husband    Social Determinants of Health   Financial Resource Strain: High Risk (05/08/2021)   Overall Financial Resource Strain (CARDIA)    Difficulty of Paying Living Expenses: Hard  Food Insecurity: No Food Insecurity (04/24/2022)   Hunger Vital Sign    Worried About Running Out of Food in the Last Year: Never true    Ran Out of Food in the Last Year: Never true  Transportation Needs: No Transportation Needs (04/24/2022)   PRAPARE - Hydrologist (Medical): No    Lack of Transportation (Non-Medical): No  Physical Activity: Inactive (11/20/2021)   Exercise Vital Sign    Days of Exercise per Week: 0 days    Minutes of Exercise per Session: 0 min  Stress: Stress Concern Present (11/20/2021)   [place]    Feeling of Stress : To some extent  Social Connections: Socially Integrated (05/08/2021)   Social Connection and Isolation Panel [NHANES]    Frequency of Communication with Friends and Family: More than three times a week    Frequency of Social Gatherings with Friends and Family: Once a week    Attends Religious Services: More than 4 times per year    Active Member of [employer] or Organizations: Yes    Attends Archivist Meetings: 1 to 4 times per year    Marital  Status: Married  Human resources officer Violence: Not At Risk (04/24/2022)   Humiliation, Afraid, Rape, and Kick questionnaire  Fear of Current or Ex-Partner: No    Emotionally Abused: No    Physically Abused: No    Sexually Abused: No   Family History  Problem Relation Age of Onset   Hypertension Father    Hypercholesterolemia Father    Arthritis Father    Hypertension Sister    Hypercholesterolemia Sister    Breast cancer Sister    Hypertension Sister    Colon cancer Neg Hx    Colon polyps Neg Hx     ROS: Otherwise negative unless mentioned in HPI  Physical Examination  Vitals:   04/25/22 0700 04/25/22 0827  BP:  115/61  Pulse: 86 87  Resp:    Temp:  98.5 F (36.9 C)  SpO2: 96% 95%   Body mass index is 33.47 kg/m.  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 Lymph:  Unremarkable  CBC    Component Value Date/Time   WBC 30.2 (H) 04/25/2022 0049   RBC 4.08 04/25/2022 0049   HGB 10.2 (L) 04/25/2022 0049   HCT 32.1 (L) 04/25/2022 0049   PLT 291 04/25/2022 0049   MCV 78.7 (L) 04/25/2022 0049   MCH 25.0 (L) 04/25/2022 0049   MCHC 31.8 04/25/2022 0049   RDW 16.9 (H) 04/25/2022 0049   LYMPHSABS 1.2 04/25/2022 0049   MONOABS 1.2 (H) 04/25/2022 0049   EOSABS 0.0 04/25/2022 0049   BASOSABS 0.0 04/25/2022 0049    BMET    Component Value Date/Time   NA 142 04/25/2022 0049   NA 139 07/17/2019 1157   K 4.6 04/25/2022 0049   CL 102 04/25/2022 0049   CO2  23 04/25/2022 0049   GLUCOSE 142 (H) 04/25/2022 0049   BUN 64 (H) 04/25/2022 0049   BUN 43 (A) 07/17/2019 1157   CREATININE 8.15 (H) 04/25/2022 0049   CREATININE 3.74 (H) 08/23/2017 1407   CALCIUM 8.6 (L) 04/25/2022 0049   CALCIUM 9.4 10/15/2017 1258   GFRNONAA 5 (L) 04/25/2022 0049   GFRNONAA 12 (L) 08/23/2017 1407   GFRAA 8 (L) 07/19/2019 1210   GFRAA 14 (L) 08/23/2017 1407    COAGS: Lab Results  Component Value Date   INR 1.0 03/03/2019   INR 1.17 09/02/2017     Non-Invasive Vascular Imaging:      ASSESSMENT/PLAN: This is a 67 y.o. female who presented to the hospital with right first toe wound.  On physical exam, she had nonpalpable pulses.  ABI was reviewed demonstrating severe peripheral arterial disease with no toe pressures.  Kathryn Beck has severe, bilateral lower extremity, multilevel occlusive disease.  On the right, this is classified as critical limb ischemia with tissue loss at the great toe.  Kathryn Beck will need right lower extremity angiogram with possible intervention in an effort to define and improve distal perfusion for wound healing.  She will require amputation of the toe at a minimum.  With her longstanding diabetes, and multilevel occlusive disease, I am very concerned that Kathryn Beck will have a desert foot, and will have very limited revascularization options.  Plan for right lower extremity angiogram Tuesday with my partner Dr.  Brabham.  Please make n.p.o. Monday night, and ensure dialysis occurs Monday.   Cassandria Santee MD [facility] [phone] 04/25/2022  11:11 AM

## 2022-04-26 ENCOUNTER — Encounter: Payer: Self-pay | Admitting: Family Medicine

## 2022-04-26 DIAGNOSIS — E1165 Type 2 diabetes mellitus with hyperglycemia: Secondary | ICD-10-CM | POA: Diagnosis not present

## 2022-04-26 DIAGNOSIS — N186 End stage renal disease: Secondary | ICD-10-CM | POA: Diagnosis not present

## 2022-04-26 DIAGNOSIS — I739 Peripheral vascular disease, unspecified: Secondary | ICD-10-CM | POA: Diagnosis not present

## 2022-04-26 DIAGNOSIS — Z741 Need for assistance with personal care: Secondary | ICD-10-CM | POA: Insufficient documentation

## 2022-04-26 DIAGNOSIS — I96 Gangrene, not elsewhere classified: Secondary | ICD-10-CM | POA: Diagnosis not present

## 2022-04-26 LAB — CBC WITH DIFFERENTIAL/PLATELET
Abs Immature Granulocytes: 0.49 10*3/uL — ABNORMAL HIGH (ref 0.00–0.07)
Basophils Absolute: 0 10*3/uL (ref 0.0–0.1)
Basophils Relative: 0 %
Eosinophils Absolute: 0.1 10*3/uL (ref 0.0–0.5)
Eosinophils Relative: 0 %
HCT: 30.8 % — ABNORMAL LOW (ref 36.0–46.0)
Hemoglobin: 9.9 g/dL — ABNORMAL LOW (ref 12.0–15.0)
Immature Granulocytes: 2 %
Lymphocytes Relative: 3 %
Lymphs Abs: 0.6 10*3/uL — ABNORMAL LOW (ref 0.7–4.0)
MCH: 24.8 pg — ABNORMAL LOW (ref 26.0–34.0)
MCHC: 32.1 g/dL (ref 30.0–36.0)
MCV: 77.2 fL — ABNORMAL LOW (ref 80.0–100.0)
Monocytes Absolute: 1 10*3/uL (ref 0.1–1.0)
Monocytes Relative: 5 %
Neutro Abs: 20.3 10*3/uL — ABNORMAL HIGH (ref 1.7–7.7)
Neutrophils Relative %: 90 %
Platelets: 164 10*3/uL (ref 150–400)
RBC: 3.99 MIL/uL (ref 3.87–5.11)
RDW: 17.1 % — ABNORMAL HIGH (ref 11.5–15.5)
WBC: 22.5 10*3/uL — ABNORMAL HIGH (ref 4.0–10.5)
nRBC: 0.1 % (ref 0.0–0.2)

## 2022-04-26 LAB — GLUCOSE, CAPILLARY
Glucose-Capillary: 102 mg/dL — ABNORMAL HIGH (ref 70–99)
Glucose-Capillary: 108 mg/dL — ABNORMAL HIGH (ref 70–99)
Glucose-Capillary: 94 mg/dL (ref 70–99)
Glucose-Capillary: 85 mg/dL (ref 70–99)
Glucose-Capillary: 114 mg/dL — ABNORMAL HIGH (ref 70–99)

## 2022-04-26 LAB — RENAL FUNCTION PANEL
Albumin: 2.4 g/dL — ABNORMAL LOW (ref 3.5–5.0)
Anion gap: 14 (ref 5–15)
BUN: 36 mg/dL — ABNORMAL HIGH (ref 8–23)
CO2: 26 mmol/L (ref 22–32)
Calcium: 8.6 mg/dL — ABNORMAL LOW (ref 8.9–10.3)
Chloride: 95 mmol/L — ABNORMAL LOW (ref 98–111)
Creatinine, Ser: 5.18 mg/dL — ABNORMAL HIGH (ref 0.44–1.00)
GFR, Estimated: 9 mL/min — ABNORMAL LOW (ref >60)
Glucose, Bld: 99 mg/dL (ref 70–99)
Phosphorus: 4.3 mg/dL (ref 2.5–4.6)
Potassium: 3.2 mmol/L — ABNORMAL LOW (ref 3.5–5.1)
Sodium: 135 mmol/L (ref 135–145)

## 2022-04-26 LAB — MAGNESIUM: Magnesium: 1.6 mg/dL — ABNORMAL LOW (ref 1.7–2.4)

## 2022-04-26 MED ORDER — MAGNESIUM SULFATE 2 GM/50ML IV SOLN
2.0000 g | Freq: Once | INTRAVENOUS | Status: DC
  Filled 2022-04-26: qty 50

## 2022-04-26 MED ORDER — INSULIN GLARGINE-YFGN 100 UNIT/ML ~~LOC~~ SOLN
30.0000 [IU] | Freq: Every day | SUBCUTANEOUS | Status: DC
  Administered 2022-04-26: 30 [IU] via SUBCUTANEOUS
  Filled 2022-04-26 (×2): qty 0.3

## 2022-04-26 NOTE — Assessment & Plan Note (Signed)
Will refer to social worker/ [facility] for help with getting assistance with needed

## 2022-04-26 NOTE — Assessment & Plan Note (Addendum)
Increased fatigue and shortness of breath reported , will arrange cardiology f/u if none in the near future, already has echo scheduled for 2/15

## 2022-04-26 NOTE — Assessment & Plan Note (Signed)
Uncontrolled managed through [facility] and nephrology

## 2022-04-26 NOTE — Assessment & Plan Note (Signed)
Appears euvolemic  

## 2022-04-26 NOTE — Progress Notes (Signed)
Kathryn Beck     MRN: [id number]      DOB: March 04, 1956   HPI Kathryn Beck is here for follow up and re-evaluation of chronic medical conditions, her sister accompanies her, however her daughter has sent a note of concern regarding her mother's general poor health Increased shortness of breath more frequently Right big toe dark, unknown duration . Increased weakness Increased confusion 5. Requests walk in shower as difficult/ incapable of safely raising leg to get into shower unattended Sister  accompanying her endorses all of the above concerns, family is looking for help with care as far as personal care, activities of daily living and housekeeping because of this , unclear whether she has medicaid as well as medicare    ROS Denies recent fever or chills.Chronic fatigue esp post diaysis Denies sinus pressure, nasal congestion, ear pain or sore throat. Denies chest congestion, productive cough or wheezing. Denies chest pains, palpitations and leg swelling Denies abdominal pain, nausea, vomiting,diarrhea or constipation.   Denies dysuria, frequency, hesitancy or incontinence. Chronic  joint pain, swelling and limitation in mobility. Denies skin break down or rash.   PE  BP (!) 200/80   Pulse 86   Ht '5\' 4"'$  (1.626 m)   Wt 190 lb 1.3 oz (86.2 kg)   SpO2 92%   BMI 32.63 kg/m   Patient alert and oriented and blood pressure markedly elevated HEENT: No facial asymmetry, EOMI,     Neck supple .  Chest: Clear to auscultation bilaterally.  CVS: S1, S2 , no S3.Regular rate.  ABD: Soft non tender.   Ext: No edema  MS: decreased ROM spine, shoulders, hips and knees.  Skin: Intact, hyper[pigmented area on right big toe, tender nd mildly erythen Psych: Good eye contact, normal affect. Memory intact  anxious and  depressed appearing.  CNS: CN 2-12 intact, power,  normal throughout.no focal deficits noted.   Assessment & Plan  Malignant hypertension Uncontrolled managed  through [facility] and nephrology  Type 2 diabetes mellitus with hyperglycemia ([place]) Uncontrolled and managed by Endo Kathryn Beck is reminded of the importance of commitment to daily physical activity for 30 minutes or more, as able and the need to limit carbohydrate intake to 30 to 60 grams per meal to help with blood sugar control.   The need to take medication as prescribed, test blood sugar as directed, and to call between visits if there is a concern that blood sugar is uncontrolled is also discussed.   Kathryn Beck is reminded of the importance of daily foot exam, annual eye examination, and good blood sugar, blood pressure and cholesterol control.     Latest Ref Rng & Units 04/26/2022   12:31 AM 04/25/2022   12:49 AM 04/16/2022   11:57 AM 04/14/2022    9:34 PM 10/28/2021    1:06 PM  Diabetic Labs  HbA1c 4.0 - 5.6 %   9.3   8.9   Creatinine 0.44 - 1.00 mg/dL 5.18  8.15   10.33        04/26/2022    8:09 PM 04/26/2022    4:25 PM 04/26/2022    8:55 AM 04/26/2022    5:37 AM 04/25/2022   11:57 PM 04/25/2022    7:36 PM 04/25/2022    6:55 PM  BP/Weight  Systolic BP 678 938 101 751 025 852 778  Diastolic BP 61 57 45 72 79 65 60  Wt. (Lbs)    190.1     BMI    32.63  kg/m2         Latest Ref Rng & Units 04/16/2022   11:20 AM 07/24/2021    3:42 PM  Foot/eye exam completion dates  Eye Exam No Retinopathy  No Retinopathy      Foot Form Completion  Done      This result is from an external source.        Right leg weakness Increasingly debilitated with unsafe ambulation independently, requires assistance with personal car and ADL's also with housekeeping, will benefit also from walk in shower, incapable of sagely getting into tub  Requires assistance with activities of daily living (ADL) Will refer to social worker/ [facility] for help with getting assistance with needed  Chronic diastolic (congestive) heart failure (HCC) Increased fatigue and shortness of breath reported , will arrange cardiology f/u if  none in the near future, already has echo scheduled for 2/15

## 2022-04-26 NOTE — Progress Notes (Signed)
[facility] Progress Note   67 y.o. female DM, HTN, HLD, OSA ([place] w/ CPAP), ESRD dialyzed MWF at [facility] followed by Dr. Murlean Iba. Her last outpt dialysis treatment was on Wednesday and she missed her treatment on Friday. Patient here with worsening gangrene of the right great toe which she states has been declining over the past 2 weeks. WBC 28K Tm100.6.    Assessment/ Plan:   ESRD - formerly on PD but now on iHD MWF @ [place] HD 2/3 with 2L net UF; next HD tomorrow on MWF regimen. 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 Non due for Mircera yet. Transfuse as needed. 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 HTN - controlled DM - per primary  Subjective:   Denies f/c/n/v/sob. Tolerated HD yest.   Objective:   BP (!) 107/45 (BP Location: Right Wrist)   Pulse 74   Temp 98.4 F (36.9 C) (Oral)   Resp 19   Ht '5\' 4"'$  (1.626 m)   Wt 86.2 kg   SpO2 96%   BMI 32.63 kg/m   Intake/Output Summary (Last 24 hours) at 04/26/2022 1008 Last data filed at 04/26/2022 0900 Gross per 24 hour  Intake 578.09 ml  Output 2000 ml  Net -1421.91 ml   Weight change: -0.971 kg  Physical Exam: General appearance: A&Ox3, pleasant Head: NCAT Neck: supple, symmetrical, trachea midline Resp: CTA b/l Cardio: RRR GI: SNDNT+BS Extremities: Rt great toe gangrene Access: lt BCF good thrill  Imaging: VAS [place] ABI WITH/WO TBI  Result Date: 04/25/2022  LOWER EXTREMITY DOPPLER STUDY Patient Name:  Kathryn Beck  Date of Exam:   04/25/2022 Medical Rec #: [id number]        Accession #:    [id number] Date of Birth: [birth year]        Patient Gender: F Patient Age:   74 years Exam Location:  [facility] Procedure:      VAS [place] ABI WITH/WO TBI Referring Phys: Jennette Kettle  --------------------------------------------------------------------------------  Indications: Ulceration, and gangrene. High Risk Factors: Hypertension, hyperlipidemia, Diabetes. Other Factors: ESRD, on dialysis.  Comparison Study: No prior study on file Performing Technologist: Sharion Dove RVS  Examination Guidelines: A complete evaluation includes at minimum, Doppler waveform signals and systolic blood pressure reading at the level of bilateral brachial, anterior tibial, and posterior tibial arteries, when vessel segments are accessible. Bilateral testing is considered an integral part of a complete examination. Photoelectric Plethysmograph (PPG) waveforms and toe systolic pressure readings are included as required and additional duplex testing as needed. Limited examinations for reoccurring indications may be performed as noted.  ABI Findings: +---------+------------------+-----+-------------------+--------+ Right    Rt Pressure (mmHg)IndexWaveform           Comment  +---------+------------------+-----+-------------------+--------+ Brachial 135                                                +---------+------------------+-----+-------------------+--------+ PTA      37                0.27 dampened monophasic         +---------+------------------+-----+-------------------+--------+ DP       54  0.40 dampened monophasic         +---------+------------------+-----+-------------------+--------+ Great Toe                       Absent                      +---------+------------------+-----+-------------------+--------+ +---------+------------------+-----+-------------------+---------------------+ Left     Lt Pressure (mmHg)IndexWaveform           Comment               +---------+------------------+-----+-------------------+---------------------+ Brachial                                           restricted (dialysis)  +---------+------------------+-----+-------------------+---------------------+ PTA      40                0.30 dampened monophasic                      +---------+------------------+-----+-------------------+---------------------+ DP       57                0.42 dampened monophasic                      +---------+------------------+-----+-------------------+---------------------+ Great Toe                       Absent                                   +---------+------------------+-----+-------------------+---------------------+ +-------+-----------+-----------+------------+------------+ ABI/TBIToday's ABIToday's TBIPrevious ABIPrevious TBI +-------+-----------+-----------+------------+------------+ Right  0.40       absent                              +-------+-----------+-----------+------------+------------+ Left   0.42       absent                              +-------+-----------+-----------+------------+------------+  Summary: Right: Resting right ankle-brachial index indicates severe right lower extremity arterial disease. The right toe-brachial index is abnormal. Left: Resting left ankle-brachial index indicates severe left lower extremity arterial disease. The left toe-brachial index is abnormal. *See table(s) above for measurements and observations.  Electronically signed by Orlie Pollen on 04/25/2022 at 1:23:59 PM.    Final     Labs: BMET Recent Labs  Lab 04/25/22 0049 04/26/22 0031  NA 142 135  K 4.6 3.2*  CL 102 95*  CO2 23 26  GLUCOSE 142* 99  BUN 64* 36*  CREATININE 8.15* 5.18*  CALCIUM 8.6* 8.6*  PHOS 5.9* 4.3   CBC Recent Labs  Lab 04/25/22 0049 04/26/22 0031  WBC 30.2* 22.5*  NEUTROABS 27.8* 20.3*  HGB 10.2* 9.9*  HCT 32.1* 30.8*  MCV 78.7* 77.2*  PLT 291 164    Medications:     (feeding supplement) PROSource Plus  30 mL Oral BID BM   Chlorhexidine Gluconate Cloth  6 each Topical Q0600   DULoxetine  60 mg Oral Daily   ezetimibe   10 mg Oral Daily   feeding supplement (NEPRO CARB STEADY)  237 mL Oral Q24H   heparin  5,000 Units Subcutaneous Q8H   insulin aspart  0-20  Units Subcutaneous TID WC   insulin aspart  6 Units Subcutaneous TID WC   insulin glargine-yfgn  30 Units Subcutaneous Daily   metoprolol succinate  50 mg Oral Daily   metroNIDAZOLE  500 mg Oral Q12H   multivitamin  1 tablet Oral QHS   pregabalin  50 mg Oral TID   sertraline  50 mg Oral Daily      Otelia Santee, MD 04/26/2022, 10:08 AM

## 2022-04-26 NOTE — Assessment & Plan Note (Signed)
Increasingly debilitated with unsafe ambulation independently, requires assistance with personal car and ADL's also with housekeeping, will benefit also from walk in shower, incapable of sagely getting into tub

## 2022-04-26 NOTE — Progress Notes (Signed)
Progress Note    [name]   BHA:[id number]  DOB: 06/16/1955  DOA: 04/24/2022     2 PCP: Fayrene Helper, MD  Initial CC: foot wound  Hospital Course: [name] is a 67 yo female with PMH DMII, HTN, HLD, ESRD on HD, depression who presented with a nonhealing wound on her R great toe. She denied recent injury to her foot but states the wound has continued to worsen.  Appearance was concerning for gangrene.  She was started on antibiotics and admitted for further evaluation.  Interval History:  No events overnight.  Tolerated dialysis well yesterday.  Understands plan for angiogram on Tuesday then further surgery planning.   Assessment and Plan: Gangrene of toe of right foot ([place]) - Nonhealing wound to the right great toe, no obvious injury prior to onset.  Patient endorses wound has been present for approximately 2 to 3 weeks prior to hospitalization - With underlying diabetes and ESRD, at risk for vascular disease.  ABIs confirm severe underlying perfusion to her foot - Tentative plan for angiogram on Tuesday and eventual toe amputation if not more proximal depending on angiogram findings.  Final plan deferred to orthopedic surgery and vascular surgery - Continue vancomycin, Rocephin, and Flagyl for now  PAD (peripheral artery disease) (HCC) - ABI performed on 2/3 indicating severe disease. Right 0.4 and Left 0.42 -Vascular surgery following -Plan is for angiogram on Tuesday  ESRD on hemodialysis ([facility]) - ESRD on HD MWF - Nephrology following, appreciate assistance.  Last dialysis session was on Wednesday prior to admission -Underwent HD on Saturday due to missed session and will resume on MWF schedule per nephrology  Type 2 diabetes mellitus with hyperglycemia (HCC) - A1c 9.3% on 04/16/2022 - Follows outpatient with endocrinology - Continue Semglee and sliding scale.  Will adjust as necessary  Neuropathy - Continue Lyrica.  Database reviewed  HTN (hypertension) -  Blood pressure stable/borderline low at times - Continue Toprol - Hold amlodipine, Imdur, clonidine for now  Depression - Continue Cymbalta and Zoloft  Chronic diastolic (congestive) heart failure (HCC) - no s/s exacerbation - last echo 2020: EF 60-65%, mod LVH, impaired diastology   Hyperlipidemia - Continue Zetia - Not on statin due to history of elevated LFTs   Old records reviewed in assessment of this patient  Antimicrobials: Vancomycin 04/25/2022 >> current Rocephin 04/25/2022 >> current Flagyl 04/25/2022 >> current  DVT prophylaxis:  heparin injection 5,000 Units Start: 04/25/22 0115   Code Status:   Code Status: Full Code  Mobility Assessment (last 72 hours)     Mobility Assessment     Row Name 04/25/22 0823 04/24/22 2200         Does patient have an order for bedrest or is patient medically unstable No - Continue assessment No - Continue assessment      What is the highest level of mobility based on the progressive mobility assessment? Level 4 (Walks with assist in room) - Balance while marching in place and cannot step forward and back - Complete Level 4 (Walks with assist in room) - Balance while marching in place and cannot step forward and back - Complete               Barriers to discharge:  Disposition Plan: Home Status is: Inpatient  Objective: Blood pressure (!) 107/45, pulse 74, temperature 98.4 F (36.9 C), temperature source Oral, resp. rate 19, height '5\' 4"'$  (1.626 m), weight 86.2 kg, SpO2 96 %.  Examination:  Physical  Exam Constitutional:      Appearance: Normal appearance.  HENT:     Head: Normocephalic and atraumatic.     Mouth/Throat:     Mouth: Mucous membranes are moist.  Eyes:     Extraocular Movements: Extraocular movements intact.  Cardiovascular:     Rate and Rhythm: Normal rate and regular rhythm.  Pulmonary:     Effort: Pulmonary effort is normal. No respiratory distress.     Breath sounds: Normal breath sounds. No wheezing.   Abdominal:     General: Bowel sounds are normal. There is no distension.     Palpations: Abdomen is soft.     Tenderness: There is no abdominal tenderness.  Musculoskeletal:     Cervical back: Normal range of motion and neck supple.     Right foot: Abnormal pulse.     Left foot: Abnormal pulse.     Comments: Right foot great toe noted with gangrene involving approx 3/4 of appendage. Unable to palpate DP in either foot  Skin:    General: Skin is warm and dry.  Neurological:     General: No focal deficit present.     Mental Status: She is alert.  Psychiatric:        Mood and Affect: Mood normal.      Consultants:  Nephrology Vascular surgery Orthopedic surgery  Procedures:    Data Reviewed: Results for orders placed or performed during the hospital encounter of 04/24/22 (from the past 24 hour(s))  Glucose, capillary     Status: Abnormal   Collection Time: 04/25/22  8:39 PM  Result Value Ref Range   Glucose-Capillary 172 (H) 70 - 99 mg/dL   Comment 1 Notify RN    Comment 2 Document in Chart   CBC with Differential/Platelet     Status: Abnormal   Collection Time: 04/26/22 12:31 AM  Result Value Ref Range   WBC 22.5 (H) 4.0 - 10.5 K/uL   RBC 3.99 3.87 - 5.11 MIL/uL   Hemoglobin 9.9 (L) 12.0 - 15.0 g/dL   HCT 30.8 (L) 36.0 - 46.0 %   MCV 77.2 (L) 80.0 - 100.0 fL   MCH 24.8 (L) 26.0 - 34.0 pg   MCHC 32.1 30.0 - 36.0 g/dL   RDW 17.1 (H) 11.5 - 15.5 %   Platelets 164 150 - 400 K/uL   nRBC 0.1 0.0 - 0.2 %   Neutrophils Relative % 90 %   Neutro Abs 20.3 (H) 1.7 - 7.7 K/uL   Lymphocytes Relative 3 %   Lymphs Abs 0.6 (L) 0.7 - 4.0 K/uL   Monocytes Relative 5 %   Monocytes Absolute 1.0 0.1 - 1.0 K/uL   Eosinophils Relative 0 %   Eosinophils Absolute 0.1 0.0 - 0.5 K/uL   Basophils Relative 0 %   Basophils Absolute 0.0 0.0 - 0.1 K/uL   Immature Granulocytes 2 %   Abs Immature Granulocytes 0.49 (H) 0.00 - 0.07 K/uL  Magnesium     Status: Abnormal   Collection Time:  04/26/22 12:31 AM  Result Value Ref Range   Magnesium 1.6 (L) 1.7 - 2.4 mg/dL  Renal function panel     Status: Abnormal   Collection Time: 04/26/22 12:31 AM  Result Value Ref Range   Sodium 135 135 - 145 mmol/L   Potassium 3.2 (L) 3.5 - 5.1 mmol/L   Chloride 95 (L) 98 - 111 mmol/L   CO2 26 22 - 32 mmol/L   Glucose, Bld 99 70 - 99 mg/dL  BUN 36 (H) 8 - 23 mg/dL   Creatinine, Ser 5.18 (H) 0.44 - 1.00 mg/dL   Calcium 8.6 (L) 8.9 - 10.3 mg/dL   Phosphorus 4.3 2.5 - 4.6 mg/dL   Albumin 2.4 (L) 3.5 - 5.0 g/dL   GFR, Estimated 9 (L) >60 mL/min   Anion gap 14 5 - 15  Glucose, capillary     Status: Abnormal   Collection Time: 04/26/22  6:47 AM  Result Value Ref Range   Glucose-Capillary 102 (H) 70 - 99 mg/dL   Comment 1 Notify RN    Comment 2 Document in Chart   Glucose, capillary     Status: Abnormal   Collection Time: 04/26/22 12:35 PM  Result Value Ref Range   Glucose-Capillary 108 (H) 70 - 99 mg/dL   *Note: Due to a large number of results and/or encounters for the requested time period, some results have not been displayed. A complete set of results can be found in Results Review.    I have reviewed pertinent nursing notes, vitals, labs, and images as necessary. I have ordered labwork to follow up on as indicated.  I have reviewed the last notes from staff over past 24 hours. I have discussed patient's care plan and test results with nursing staff, CM/SW, and other staff as appropriate.  Time spent: Greater than 50% of the 55 minute visit was spent in counseling/coordination of care for the patient as laid out in the A&P.   LOS: 2 days   Dwyane Dee, MD [facility] 04/26/2022, 12:43 PM

## 2022-04-26 NOTE — Assessment & Plan Note (Signed)
Uncontrolled and managed by Endo Ms. Urton is reminded of the importance of commitment to daily physical activity for 30 minutes or more, as able and the need to limit carbohydrate intake to 30 to 60 grams per meal to help with blood sugar control.   The need to take medication as prescribed, test blood sugar as directed, and to call between visits if there is a concern that blood sugar is uncontrolled is also discussed.   Ms. Game is reminded of the importance of daily foot exam, annual eye examination, and good blood sugar, blood pressure and cholesterol control.     Latest Ref Rng & Units 04/26/2022   12:31 AM 04/25/2022   12:49 AM 04/16/2022   11:57 AM 04/14/2022    9:34 PM 10/28/2021    1:06 PM  Diabetic Labs  HbA1c 4.0 - 5.6 %   9.3   8.9   Creatinine 0.44 - 1.00 mg/dL 5.18  8.15   10.33        04/26/2022    8:09 PM 04/26/2022    4:25 PM 04/26/2022    8:55 AM 04/26/2022    5:37 AM 04/25/2022   11:57 PM 04/25/2022    7:36 PM 04/25/2022    6:55 PM  BP/Weight  Systolic BP 320 233 435 686 168 372 902  Diastolic BP 61 57 45 72 79 65 60  Wt. (Lbs)    190.1     BMI    32.63 kg/m2         Latest Ref Rng & Units 04/16/2022   11:20 AM 07/24/2021    3:42 PM  Foot/eye exam completion dates  Eye Exam No Retinopathy  No Retinopathy      Foot Form Completion  Done      This result is from an external source.

## 2022-04-27 ENCOUNTER — Telehealth: Payer: Self-pay

## 2022-04-27 DIAGNOSIS — I739 Peripheral vascular disease, unspecified: Secondary | ICD-10-CM | POA: Diagnosis not present

## 2022-04-27 DIAGNOSIS — E1165 Type 2 diabetes mellitus with hyperglycemia: Secondary | ICD-10-CM | POA: Diagnosis not present

## 2022-04-27 DIAGNOSIS — I96 Gangrene, not elsewhere classified: Secondary | ICD-10-CM | POA: Diagnosis not present

## 2022-04-27 DIAGNOSIS — N186 End stage renal disease: Secondary | ICD-10-CM | POA: Diagnosis not present

## 2022-04-27 LAB — CBC WITH DIFFERENTIAL/PLATELET
Abs Immature Granulocytes: 0.34 10*3/uL — ABNORMAL HIGH (ref 0.00–0.07)
Basophils Absolute: 0 10*3/uL (ref 0.0–0.1)
Basophils Relative: 0 %
Eosinophils Absolute: 0 10*3/uL (ref 0.0–0.5)
Eosinophils Relative: 0 %
HCT: 31 % — ABNORMAL LOW (ref 36.0–46.0)
Hemoglobin: 9.9 g/dL — ABNORMAL LOW (ref 12.0–15.0)
Immature Granulocytes: 2 %
Lymphocytes Relative: 7 %
Lymphs Abs: 1.1 10*3/uL (ref 0.7–4.0)
MCH: 24.7 pg — ABNORMAL LOW (ref 26.0–34.0)
MCHC: 31.9 g/dL (ref 30.0–36.0)
MCV: 77.3 fL — ABNORMAL LOW (ref 80.0–100.0)
Monocytes Absolute: 0.7 10*3/uL (ref 0.1–1.0)
Monocytes Relative: 4 %
Neutro Abs: 14.9 10*3/uL — ABNORMAL HIGH (ref 1.7–7.7)
Neutrophils Relative %: 87 %
Platelets: 168 10*3/uL (ref 150–400)
RBC: 4.01 MIL/uL (ref 3.87–5.11)
RDW: 17.2 % — ABNORMAL HIGH (ref 11.5–15.5)
WBC: 17.1 10*3/uL — ABNORMAL HIGH (ref 4.0–10.5)
nRBC: 0.4 % — ABNORMAL HIGH (ref 0.0–0.2)

## 2022-04-27 LAB — RENAL FUNCTION PANEL
Albumin: 2.5 g/dL — ABNORMAL LOW (ref 3.5–5.0)
Anion gap: 14 (ref 5–15)
BUN: 56 mg/dL — ABNORMAL HIGH (ref 8–23)
CO2: 25 mmol/L (ref 22–32)
Calcium: 9.1 mg/dL (ref 8.9–10.3)
Chloride: 97 mmol/L — ABNORMAL LOW (ref 98–111)
Creatinine, Ser: 6.87 mg/dL — ABNORMAL HIGH (ref 0.44–1.00)
GFR, Estimated: 6 mL/min — ABNORMAL LOW (ref >60)
Glucose, Bld: 131 mg/dL — ABNORMAL HIGH (ref 70–99)
Phosphorus: 7.5 mg/dL — ABNORMAL HIGH (ref 2.5–4.6)
Potassium: 4.1 mmol/L (ref 3.5–5.1)
Sodium: 136 mmol/L (ref 135–145)

## 2022-04-27 LAB — GLUCOSE, CAPILLARY
Glucose-Capillary: 121 mg/dL — ABNORMAL HIGH (ref 70–99)
Glucose-Capillary: 114 mg/dL — ABNORMAL HIGH (ref 70–99)
Glucose-Capillary: 173 mg/dL — ABNORMAL HIGH (ref 70–99)
Glucose-Capillary: 213 mg/dL — ABNORMAL HIGH (ref 70–99)

## 2022-04-27 LAB — HEPATITIS B SURFACE ANTIBODY, QUANTITATIVE: Hep B S AB Quant (Post): >1000 m[IU]/mL (ref >9.9)

## 2022-04-27 LAB — MAGNESIUM: Magnesium: 2.4 mg/dL (ref 1.7–2.4)

## 2022-04-27 MED ORDER — INSULIN GLARGINE-YFGN 100 UNIT/ML ~~LOC~~ SOLN
25.0000 [IU] | Freq: Every day | SUBCUTANEOUS | Status: DC
  Administered 2022-04-28 – 2022-04-29 (×2): 25 [IU] via SUBCUTANEOUS
  Filled 2022-04-27 (×2): qty 0.25

## 2022-04-27 MED ORDER — LIDOCAINE VISCOUS HCL 2 % MT SOLN
15.0000 mL | OROMUCOSAL | Status: DC | PRN
  Administered 2022-04-28 – 2022-05-05 (×4): 15 mL via OROMUCOSAL
  Filled 2022-04-27 (×7): qty 15

## 2022-04-27 NOTE — Inpatient Diabetes Management (Signed)
Inpatient Diabetes Program Recommendations  AACE/ADA: New Consensus Statement on Inpatient Glycemic Control (2015)  Target Ranges:  Prepandial:   less than 140 mg/dL      Peak postprandial:   less than 180 mg/dL (1-2 hours)      Critically ill patients:  140 - 180 mg/dL   Lab Results  Component Value Date   GLUCAP 121 (H) 04/27/2022   HGBA1C 9.3 (A) 04/16/2022    Review of Glycemic Control  Latest Reference Range & Units 04/26/22 16:22 04/26/22 17:17 04/26/22 20:24 04/27/22 06:53  Glucose-Capillary 70 - 99 mg/dL 94 85 114 (H) 121 (H)  (H): Data is abnormally high Diabetes history: Type 2 DM Outpatient Diabetes medications: Glipizide 10 mg BID, Toujeo 60 units QD, Fiasp 18-30 units TID Current orders for Inpatient glycemic control: Semglee 25 units QD, Novolog 0-20 units TID  Inpatient Diabetes Program Recommendations:    Attempted to speak with patient regarding diabetes management.  Patient in HD. Will reattempt on 2/6.   Thanks, Bronson Curb, MSN, RNC-OB Diabetes Coordinator [phone] (8a-5p)

## 2022-04-27 NOTE — Progress Notes (Signed)
  [facility] Progress Note   67 y.o. female DM, HTN, HLD, OSA ([place] w/ CPAP), ESRD dialyzed MWF at [facility] followed by Dr. Murlean Iba. Her last outpt dialysis treatment was on Wednesday and she missed her treatment on Friday. Patient here with worsening gangrene of the right great toe which she states has been declining over the past 2 weeks. WBC 28K Tm100.6.    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 Non due for Mircera yet. Transfuse as needed. 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 HTN - controlled DM - per primary  Subjective:   Denies f/c/n/v/sob. Tolerating HD today.   Objective:   BP (!) 129/57   Pulse 66   Temp 97.8 F (36.6 C) (Oral) Comment: Simultaneous filing. User may not have seen previous data. Comment (Src): Simultaneous filing. User may not have seen previous data.  Resp 15   Ht '5\' 4"'$  (1.626 m)   Wt 88.6 kg   SpO2 94%   BMI 33.53 kg/m   Intake/Output Summary (Last 24 hours) at 04/27/2022 1109 Last data filed at 04/27/2022 3532 Gross per 24 hour  Intake 300 ml  Output --  Net 300 ml    Weight change:   Physical Exam: General appearance: sleeping, arouses to voice, no distress Head: NCAT Neck: supple, symmetrical, trachea midline Resp: CTA b/l Cardio: RRR GI: SNDNT+BS Extremities: Rt great toe gangrene Access: lt BCF good thrill  Imaging: No results found.  Labs: BMET Recent Labs  Lab 04/25/22 0049 04/26/22 0031 04/27/22 0042  NA 142 135 136  K 4.6 3.2* 4.1  CL 102 95* 97*  CO2 '23 26 25  '$ GLUCOSE 142* 99 131*  BUN 64* 36* 56*  CREATININE 8.15* 5.18* 6.87*  CALCIUM 8.6* 8.6* 9.1  PHOS 5.9* 4.3 7.5*    CBC Recent Labs  Lab 04/25/22 0049  04/26/22 0031 04/27/22 0042  WBC 30.2* 22.5* 17.1*  NEUTROABS 27.8* 20.3* 14.9*  HGB 10.2* 9.9* 9.9*  HCT 32.1* 30.8* 31.0*  MCV 78.7* 77.2* 77.3*  PLT 291 164 168     Medications:     (feeding supplement) PROSource Plus  30 mL Oral BID BM   Chlorhexidine Gluconate Cloth  6 each Topical Q0600   DULoxetine  60 mg Oral Daily   ezetimibe  10 mg Oral Daily   feeding supplement (NEPRO CARB STEADY)  237 mL Oral Q24H   heparin  5,000 Units Subcutaneous Q8H   insulin aspart  0-20 Units Subcutaneous TID WC   [START ON 04/28/2022] insulin glargine-yfgn  25 Units Subcutaneous Daily   metoprolol succinate  50 mg Oral Daily   metroNIDAZOLE  500 mg Oral Q12H   multivitamin  1 tablet Oral QHS   pregabalin  50 mg Oral TID   sertraline  50 mg Oral Daily    Jannifer Hick MD [facility] Pager [phone]

## 2022-04-27 NOTE — Progress Notes (Signed)
Received patient in bed to unit.  Alert and oriented.  Informed consent signed and in chart.   TX duration: 3.75hrs  Patient tolerated well.  Transported back to the room  Alert, without acute distress.  Hand-off given to patient's nurse.   Access used: L AVF Access issues: None  Total UF removed: 2694m Medication(s) given: Vanc 1g  Post HD weight: 85.2   04/27/22 1327  Vitals  Temp 98.1 F (36.7 C)  Temp Source Oral  BP (!) 144/62  MAP (mmHg) 85  Pulse Rate 68  ECG Heart Rate 69  Resp 15  Oxygen Therapy  SpO2 93 %  During Treatment Monitoring  Intra-Hemodialysis Comments Tx completed;Tolerated well (circuit clotted with 118ms left on the tx; blood returned to patient.)  Post Treatment  Dialyzer Clearance Heavily streaked  Duration of HD Treatment -hour(s) 3.75 hour(s)  Liters Processed 83.4  Fluid Removed (mL) 2500 mL  Tolerated HD Treatment Yes  Post-Hemodialysis Comments 7  AVG/AVF Arterial Site Held (minutes) 7 minutes  Fistula / Graft Left Upper arm Arteriovenous fistula  Placement Date/Time: 09/02/17 1150   Placed prior to admission: Yes  Orientation: Left  Access Location: Upper arm  Access Type: Arteriovenous fistula  Site Condition No complications  Fistula / Graft Assessment Present;Thrill;Bruit  Status Deaccessed     [facility]

## 2022-04-27 NOTE — Progress Notes (Signed)
  Progress Note    04/27/2022 8:50 AM * No surgery found *  Subjective:  no complaints    Vitals:   04/27/22 0515 04/27/22 0717  BP: (!) 130/54 130/65  Pulse: 65 66  Resp: 18 19  Temp: (!) 97.5 F (36.4 C) 98.5 F (36.9 C)  SpO2: 98% 97%    Physical Exam Lungs: nonlabored Extremities:  unchanged wound of R GT  CBC    Component Value Date/Time   WBC 17.1 (H) 04/27/2022 0042   RBC 4.01 04/27/2022 0042   HGB 9.9 (L) 04/27/2022 0042   HCT 31.0 (L) 04/27/2022 0042   PLT 168 04/27/2022 0042   MCV 77.3 (L) 04/27/2022 0042   MCH 24.7 (L) 04/27/2022 0042   MCHC 31.9 04/27/2022 0042   RDW 17.2 (H) 04/27/2022 0042   LYMPHSABS 1.1 04/27/2022 0042   MONOABS 0.7 04/27/2022 0042   EOSABS 0.0 04/27/2022 0042   BASOSABS 0.0 04/27/2022 0042    BMET    Component Value Date/Time   NA 136 04/27/2022 0042   NA 139 07/17/2019 1157   K 4.1 04/27/2022 0042   CL 97 (L) 04/27/2022 0042   CO2 25 04/27/2022 0042   GLUCOSE 131 (H) 04/27/2022 0042   BUN 56 (H) 04/27/2022 0042   BUN 43 (A) 07/17/2019 1157   CREATININE 6.87 (H) 04/27/2022 0042   CREATININE 3.74 (H) 08/23/2017 1407   CALCIUM 9.1 04/27/2022 0042   CALCIUM 9.4 10/15/2017 1258   GFRNONAA 6 (L) 04/27/2022 0042   GFRNONAA 12 (L) 08/23/2017 1407   GFRAA 8 (L) 07/19/2019 1210   GFRAA 14 (L) 08/23/2017 1407    INR    Component Value Date/Time   INR 1.0 03/03/2019 0800     Intake/Output Summary (Last 24 hours) at 04/27/2022 0850 Last data filed at 04/27/2022 9371 Gross per 24 hour  Intake 540 ml  Output --  Net 540 ml      Assessment/Plan:  67 y.o. female with R GT tissue loss   -R GT tissue loss unchanged -Pt will be getting dialysis today -Plan for right lower extremity angiogram with possible intervention with Dr.Brabham tomorrow. NPO after midnight -Will require right GT amputation at the minimum after angiogram   Vicente Serene, PA-C [facility] [phone] 04/27/2022 8:50 AM

## 2022-04-27 NOTE — Patient Outreach (Signed)
Received a hospital referral from [place][ on 04/27/22 The Primary Care Physician has posted a referral for Care Management on 04/26/22.    The Care Guides will assign a Case Manager for follow up.   [facility], [place], [place] Management [phone]

## 2022-04-27 NOTE — Progress Notes (Signed)
Pt receives out-pt HD at [facility] on MWF with 5:30 am chair time. Will assist as needed.  Melven Sartorius Renal Navigator [phone]

## 2022-04-27 NOTE — Plan of Care (Signed)

## 2022-04-27 NOTE — Progress Notes (Signed)
Progress Note    Kathryn Beck   OEV:[id number]  DOB: [birth year]  DOA: 04/24/2022     3 PCP: Fayrene Helper, MD  Initial CC: foot wound  Hospital Course: Ms. Genter is a 67 yo female with PMH DMII, HTN, HLD, ESRD on HD, depression who presented with a nonhealing wound on her R great toe. She denied recent injury to her foot but states the wound has continued to worsen.  Appearance was concerning for gangrene.  She was started on antibiotics and admitted for further evaluation.  Interval History:  No events overnight.  Getting HD today. Angiogram planned for Tuesday. No concerns or questions today.   Assessment and Plan: Gangrene of toe of right foot ([place]) - Nonhealing wound to the right great toe, no obvious injury prior to onset.  Patient endorses wound has been present for approximately 2 to 3 weeks prior to hospitalization - With underlying diabetes and ESRD, at risk for vascular disease.  ABIs confirm severe underlying perfusion to her foot - Tentative plan for angiogram on Tuesday and eventual toe amputation if not more proximal depending on angiogram findings.  Final plan deferred to orthopedic surgery and vascular surgery - Continue vancomycin, Rocephin, and Flagyl for now  PAD (peripheral artery disease) (HCC) - ABI performed on 2/3 indicating severe disease. Right 0.4 and Left 0.42 -Vascular surgery following -Plan is for angiogram on Tuesday  ESRD on hemodialysis ([facility]) - ESRD on HD MWF - Nephrology following, appreciate assistance.  Last dialysis session was on Wednesday prior to admission -Underwent HD on Saturday due to missed session and will resume on MWF schedule per nephrology  Type 2 diabetes mellitus with hyperglycemia (HCC) - A1c 9.3% on 04/16/2022 - Follows outpatient with endocrinology - Continue Semglee and sliding scale.  Will adjust as necessary  Neuropathy - Continue Lyrica.  Database reviewed  HTN (hypertension) - Blood pressure  stable/borderline low at times - Continue Toprol - Hold amlodipine, Imdur, clonidine for now  Depression - Continue Cymbalta and Zoloft  Chronic diastolic (congestive) heart failure (HCC) - no s/s exacerbation - last echo 2020: EF 60-65%, mod LVH, impaired diastology   Hyperlipidemia - Continue Zetia - Not on statin due to history of elevated LFTs   Old records reviewed in assessment of this patient  Antimicrobials: Vancomycin 04/25/2022 >> current Rocephin 04/25/2022 >> current Flagyl 04/25/2022 >> current  DVT prophylaxis:  heparin injection 5,000 Units Start: 04/25/22 0115   Code Status:   Code Status: Full Code  Mobility Assessment (last 72 hours)     Mobility Assessment     Row Name 04/26/22 1100 04/25/22 0823 04/24/22 2200       Does patient have an order for bedrest or is patient medically unstable No - Continue assessment No - Continue assessment No - Continue assessment     What is the highest level of mobility based on the progressive mobility assessment? Level 5 (Walks with assist in room/hall) - Balance while stepping forward/back and can walk in room with assist - Complete Level 4 (Walks with assist in room) - Balance while marching in place and cannot step forward and back - Complete Level 4 (Walks with assist in room) - Balance while marching in place and cannot step forward and back - Complete              Barriers to discharge:  Disposition Plan: Home Status is: Inpatient  Objective: Blood pressure (!) 146/68, pulse 69, temperature 97.8 F (36.6 C), temperature  source Oral, resp. rate 15, height '5\' 4"'$  (1.626 m), weight 88.6 kg, SpO2 92 %.  Examination:  Physical Exam Constitutional:      Appearance: Normal appearance.  HENT:     Head: Normocephalic and atraumatic.     Mouth/Throat:     Mouth: Mucous membranes are moist.  Eyes:     Extraocular Movements: Extraocular movements intact.  Cardiovascular:     Rate and Rhythm: Normal rate and regular  rhythm.  Pulmonary:     Effort: Pulmonary effort is normal. No respiratory distress.     Breath sounds: Normal breath sounds. No wheezing.  Abdominal:     General: Bowel sounds are normal. There is no distension.     Palpations: Abdomen is soft.     Tenderness: There is no abdominal tenderness.  Musculoskeletal:     Cervical back: Normal range of motion and neck supple.     Right foot: Abnormal pulse.     Left foot: Abnormal pulse.     Comments: Right foot great toe noted with gangrene involving approx 3/4 of appendage. Unable to palpate DP in either foot  Skin:    General: Skin is warm and dry.  Neurological:     General: No focal deficit present.     Mental Status: She is alert.  Psychiatric:        Mood and Affect: Mood normal.      Consultants:  Nephrology Vascular surgery Orthopedic surgery  Procedures:    Data Reviewed: Results for orders placed or performed during the hospital encounter of 04/24/22 (from the past 24 hour(s))  Glucose, capillary     Status: Abnormal   Collection Time: 04/26/22 12:35 PM  Result Value Ref Range   Glucose-Capillary 108 (H) 70 - 99 mg/dL  Glucose, capillary     Status: None   Collection Time: 04/26/22  4:22 PM  Result Value Ref Range   Glucose-Capillary 94 70 - 99 mg/dL  Glucose, capillary     Status: None   Collection Time: 04/26/22  5:17 PM  Result Value Ref Range   Glucose-Capillary 85 70 - 99 mg/dL  Glucose, capillary     Status: Abnormal   Collection Time: 04/26/22  8:24 PM  Result Value Ref Range   Glucose-Capillary 114 (H) 70 - 99 mg/dL  CBC with Differential/Platelet     Status: Abnormal   Collection Time: 04/27/22 12:42 AM  Result Value Ref Range   WBC 17.1 (H) 4.0 - 10.5 K/uL   RBC 4.01 3.87 - 5.11 MIL/uL   Hemoglobin 9.9 (L) 12.0 - 15.0 g/dL   HCT 31.0 (L) 36.0 - 46.0 %   MCV 77.3 (L) 80.0 - 100.0 fL   MCH 24.7 (L) 26.0 - 34.0 pg   MCHC 31.9 30.0 - 36.0 g/dL   RDW 17.2 (H) 11.5 - 15.5 %   Platelets 168 150 -  400 K/uL   nRBC 0.4 (H) 0.0 - 0.2 %   Neutrophils Relative % 87 %   Neutro Abs 14.9 (H) 1.7 - 7.7 K/uL   Lymphocytes Relative 7 %   Lymphs Abs 1.1 0.7 - 4.0 K/uL   Monocytes Relative 4 %   Monocytes Absolute 0.7 0.1 - 1.0 K/uL   Eosinophils Relative 0 %   Eosinophils Absolute 0.0 0.0 - 0.5 K/uL   Basophils Relative 0 %   Basophils Absolute 0.0 0.0 - 0.1 K/uL   Immature Granulocytes 2 %   Abs Immature Granulocytes 0.34 (H) 0.00 - 0.07 K/uL  Magnesium     Status: None   Collection Time: 04/27/22 12:42 AM  Result Value Ref Range   Magnesium 2.4 1.7 - 2.4 mg/dL  Renal function panel     Status: Abnormal   Collection Time: 04/27/22 12:42 AM  Result Value Ref Range   Sodium 136 135 - 145 mmol/L   Potassium 4.1 3.5 - 5.1 mmol/L   Chloride 97 (L) 98 - 111 mmol/L   CO2 25 22 - 32 mmol/L   Glucose, Bld 131 (H) 70 - 99 mg/dL   BUN 56 (H) 8 - 23 mg/dL   Creatinine, Ser 6.87 (H) 0.44 - 1.00 mg/dL   Calcium 9.1 8.9 - 10.3 mg/dL   Phosphorus 7.5 (H) 2.5 - 4.6 mg/dL   Albumin 2.5 (L) 3.5 - 5.0 g/dL   GFR, Estimated 6 (L) >60 mL/min   Anion gap 14 5 - 15  Glucose, capillary     Status: Abnormal   Collection Time: 04/27/22  6:53 AM  Result Value Ref Range   Glucose-Capillary 121 (H) 70 - 99 mg/dL   *Note: Due to a large number of results and/or encounters for the requested time period, some results have not been displayed. A complete set of results can be found in Results Review.    I have reviewed pertinent nursing notes, vitals, labs, and images as necessary. I have ordered labwork to follow up on as indicated.  I have reviewed the last notes from staff over past 24 hours. I have discussed patient's care plan and test results with nursing staff, CM/SW, and other staff as appropriate.  Time spent: Greater than 50% of the 55 minute visit was spent in counseling/coordination of care for the patient as laid out in the A&P.   LOS: 3 days   Dwyane Dee, MD [facility] 04/27/2022,  10:01 AM

## 2022-04-27 NOTE — Procedures (Signed)
I was present at this dialysis session, have reviewed the session itself and made  appropriate changes Jannifer Hick MD  Tolerating treatment well VSS UFG 2.5L AVF no issues  [place] pager [phone]   04/27/2022, 11:10 AM

## 2022-04-27 NOTE — Progress Notes (Addendum)
  Progress Note    04/27/2022 2:44 PM * No surgery date entered *  Subjective:  no complaints   Vitals:   04/27/22 1312 04/27/22 1327  BP: (!) 142/64 (!) 144/62  Pulse: 66 68  Resp: 16 15  Temp:  98.1 F (36.7 C)  SpO2: 95% 93%    Physical Exam Lungs: nonlabored Extremities:  unchanged wound of R GT  CBC    Component Value Date/Time   WBC 17.1 (H) 04/27/2022 0042   RBC 4.01 04/27/2022 0042   HGB 9.9 (L) 04/27/2022 0042   HCT 31.0 (L) 04/27/2022 0042   PLT 168 04/27/2022 0042   MCV 77.3 (L) 04/27/2022 0042   MCH 24.7 (L) 04/27/2022 0042   MCHC 31.9 04/27/2022 0042   RDW 17.2 (H) 04/27/2022 0042   LYMPHSABS 1.1 04/27/2022 0042   MONOABS 0.7 04/27/2022 0042   EOSABS 0.0 04/27/2022 0042   BASOSABS 0.0 04/27/2022 0042    BMET    Component Value Date/Time   NA 136 04/27/2022 0042   NA 139 07/17/2019 1157   K 4.1 04/27/2022 0042   CL 97 (L) 04/27/2022 0042   CO2 25 04/27/2022 0042   GLUCOSE 131 (H) 04/27/2022 0042   BUN 56 (H) 04/27/2022 0042   BUN 43 (A) 07/17/2019 1157   CREATININE 6.87 (H) 04/27/2022 0042   CREATININE 3.74 (H) 08/23/2017 1407   CALCIUM 9.1 04/27/2022 0042   CALCIUM 9.4 10/15/2017 1258   GFRNONAA 6 (L) 04/27/2022 0042   GFRNONAA 12 (L) 08/23/2017 1407   GFRAA 8 (L) 07/19/2019 1210   GFRAA 14 (L) 08/23/2017 1407    INR    Component Value Date/Time   INR 1.0 03/03/2019 0800     Intake/Output Summary (Last 24 hours) at 04/27/2022 1444 Last data filed at 04/27/2022 1327 Gross per 24 hour  Intake 300 ml  Output 2500 ml  Net -2200 ml       Assessment/Plan:  67 y.o. female with R GT tissue loss   -R GT tissue loss unchanged -Pt will be getting dialysis today -Plan for right lower extremity angiogram with possible intervention with Dr.Brabham tomorrow. NPO after midnight -Will require right GT amputation at the minimum after angiogram   Vicente Serene, PA-C [facility] [phone] 04/27/2022 2:44  PM  VASCULAR STAFF ADDENDUM: I have independently interviewed and examined the patient. I agree with the above.  Angiogram scheduled for Wednesday morning.   Cassandria Santee, MD [facility] Phone Number: [phone] 04/27/2022 2:44 PM

## 2022-04-28 ENCOUNTER — Telehealth: Payer: Self-pay | Admitting: *Deleted

## 2022-04-28 ENCOUNTER — Encounter (HOSPITAL_COMMUNITY)
Admission: RE | Disposition: A | Discharge status: Home Health | Payer: Self-pay | Source: Home / Self Care | Attending: Family Medicine

## 2022-04-28 DIAGNOSIS — E1165 Type 2 diabetes mellitus with hyperglycemia: Secondary | ICD-10-CM | POA: Diagnosis not present

## 2022-04-28 DIAGNOSIS — I96 Gangrene, not elsewhere classified: Secondary | ICD-10-CM | POA: Diagnosis not present

## 2022-04-28 DIAGNOSIS — N186 End stage renal disease: Secondary | ICD-10-CM | POA: Diagnosis not present

## 2022-04-28 DIAGNOSIS — I739 Peripheral vascular disease, unspecified: Secondary | ICD-10-CM | POA: Diagnosis not present

## 2022-04-28 LAB — GLUCOSE, CAPILLARY
Glucose-Capillary: 214 mg/dL — ABNORMAL HIGH (ref 70–99)
Glucose-Capillary: 166 mg/dL — ABNORMAL HIGH (ref 70–99)
Glucose-Capillary: 230 mg/dL — ABNORMAL HIGH (ref 70–99)
Glucose-Capillary: 182 mg/dL — ABNORMAL HIGH (ref 70–99)

## 2022-04-28 LAB — CBC WITH DIFFERENTIAL/PLATELET
Abs Immature Granulocytes: 0.27 10*3/uL — ABNORMAL HIGH (ref 0.00–0.07)
Basophils Absolute: 0.1 10*3/uL (ref 0.0–0.1)
Basophils Relative: 0 %
Eosinophils Absolute: 0.1 10*3/uL (ref 0.0–0.5)
Eosinophils Relative: 1 %
HCT: 35.1 % — ABNORMAL LOW (ref 36.0–46.0)
Hemoglobin: 11.2 g/dL — ABNORMAL LOW (ref 12.0–15.0)
Immature Granulocytes: 2 %
Lymphocytes Relative: 10 %
Lymphs Abs: 1.3 10*3/uL (ref 0.7–4.0)
MCH: 24.7 pg — ABNORMAL LOW (ref 26.0–34.0)
MCHC: 31.9 g/dL (ref 30.0–36.0)
MCV: 77.5 fL — ABNORMAL LOW (ref 80.0–100.0)
Monocytes Absolute: 0.7 10*3/uL (ref 0.1–1.0)
Monocytes Relative: 5 %
Neutro Abs: 10.4 10*3/uL — ABNORMAL HIGH (ref 1.7–7.7)
Neutrophils Relative %: 82 %
Platelets: 161 10*3/uL (ref 150–400)
RBC: 4.53 MIL/uL (ref 3.87–5.11)
RDW: 16.9 % — ABNORMAL HIGH (ref 11.5–15.5)
WBC: 12.7 10*3/uL — ABNORMAL HIGH (ref 4.0–10.5)
nRBC: 0.9 % — ABNORMAL HIGH (ref 0.0–0.2)

## 2022-04-28 LAB — RENAL FUNCTION PANEL
Albumin: 2.6 g/dL — ABNORMAL LOW (ref 3.5–5.0)
Anion gap: 14 (ref 5–15)
BUN: 45 mg/dL — ABNORMAL HIGH (ref 8–23)
CO2: 26 mmol/L (ref 22–32)
Calcium: 9 mg/dL (ref 8.9–10.3)
Chloride: 95 mmol/L — ABNORMAL LOW (ref 98–111)
Creatinine, Ser: 5 mg/dL — ABNORMAL HIGH (ref 0.44–1.00)
GFR, Estimated: 9 mL/min — ABNORMAL LOW (ref >60)
Glucose, Bld: 255 mg/dL — ABNORMAL HIGH (ref 70–99)
Phosphorus: 5.5 mg/dL — ABNORMAL HIGH (ref 2.5–4.6)
Potassium: 3.7 mmol/L (ref 3.5–5.1)
Sodium: 135 mmol/L (ref 135–145)

## 2022-04-28 LAB — MAGNESIUM: Magnesium: 2.1 mg/dL (ref 1.7–2.4)

## 2022-04-28 SURGERY — ABDOMINAL AORTOGRAM W/LOWER EXTREMITY
Anesthesia: LOCAL

## 2022-04-28 NOTE — Progress Notes (Signed)
Progress Note    Kathryn Beck   FKC:[id number]  DOB: September 30, 1955  DOA: 04/24/2022     4 PCP: Fayrene Helper, MD  Initial CC: foot wound  Hospital Course: Kathryn Beck is a 68 yo female with PMH DMII, HTN, HLD, ESRD on HD, depression who presented with a nonhealing wound on her R great toe. She denied recent injury to her foot but states the wound has continued to worsen.  Appearance was concerning for gangrene.  She was started on antibiotics and admitted for further evaluation.  Interval History:  No events overnight.  Tolerated HD well yesterday.  Angiogram moved to tomorrow.  Family present bedside this morning.  She had no other questions when seen.  Assessment and Plan: Gangrene of toe of right foot ([place]) - Nonhealing wound to the right great toe, no obvious injury prior to onset.  Patient endorses wound has been present for approximately 2 to 3 weeks prior to hospitalization - With underlying diabetes and ESRD, at risk for vascular disease.  ABIs confirm severe underlying perfusion to her foot - Angiogram moved to Wednesday and eventual toe amputation thereafter. Final plan deferred to orthopedic surgery and vascular surgery - Continue vancomycin, Rocephin, and Flagyl for now  PAD (peripheral artery disease) (HCC) - ABI performed on 2/3 indicating severe disease. Right 0.4 and Left 0.42 -Vascular surgery following -Plan is for angiogram on Wednesday  ESRD on hemodialysis ([place]) - ESRD on HD MWF - Nephrology following, appreciate assistance.  Last dialysis session was on Wednesday prior to admission -Underwent HD on Saturday due to missed session and will resume on MWF schedule per nephrology  Type 2 diabetes mellitus with hyperglycemia (HCC) - A1c 9.3% on 04/16/2022 - Follows outpatient with endocrinology - Continue Semglee and sliding scale.  Will adjust as necessary  Neuropathy - Continue Lyrica.  Database reviewed  HTN (hypertension) - Blood pressure  stable/borderline low at times - Continue Toprol - Hold amlodipine, Imdur, clonidine for now  Depression - Continue Cymbalta and Zoloft  Chronic diastolic (congestive) heart failure (HCC) - no s/s exacerbation - last echo 2020: EF 60-65%, mod LVH, impaired diastology   Hyperlipidemia - Continue Zetia - Not on statin due to history of elevated LFTs   Old records reviewed in assessment of this patient  Antimicrobials: Vancomycin 04/25/2022 >> current Rocephin 04/25/2022 >> current Flagyl 04/25/2022 >> current  DVT prophylaxis:  heparin injection 5,000 Units Start: 04/25/22 0115   Code Status:   Code Status: Full Code  Mobility Assessment (last 72 hours)     Mobility Assessment     Row Name 04/28/22 0953 04/28/22 0745 04/27/22 2029 04/27/22 0800 04/26/22 1100   Does patient have an order for bedrest or is patient medically unstable No - Continue assessment No - Continue assessment No - Continue assessment No - Continue assessment No - Continue assessment   What is the highest level of mobility based on the progressive mobility assessment? Level 4 (Walks with assist in room) - Balance while marching in place and cannot step forward and back - Complete Level 5 (Walks with assist in room/hall) - Balance while stepping forward/back and can walk in room with assist - Complete Level 4 (Walks with assist in room) - Balance while marching in place and cannot step forward and back - Complete Level 5 (Walks with assist in room/hall) - Balance while stepping forward/back and can walk in room with assist - Complete Level 5 (Walks with assist in room/hall) - Balance while  stepping forward/back and can walk in room with assist - Complete            Barriers to discharge:  Disposition Plan: Home Status is: Inpatient  Objective: Blood pressure (!) 144/71, pulse 69, temperature 97.6 F (36.4 C), temperature source Oral, resp. rate 16, height '5\' 4"'$  (1.626 m), weight 83.6 kg, SpO2 99 %.   Examination:  Physical Exam Constitutional:      Appearance: Normal appearance.  HENT:     Head: Normocephalic and atraumatic.     Mouth/Throat:     Mouth: Mucous membranes are moist.  Eyes:     Extraocular Movements: Extraocular movements intact.  Cardiovascular:     Rate and Rhythm: Normal rate and regular rhythm.  Pulmonary:     Effort: Pulmonary effort is normal. No respiratory distress.     Breath sounds: Normal breath sounds. No wheezing.  Abdominal:     General: Bowel sounds are normal. There is no distension.     Palpations: Abdomen is soft.     Tenderness: There is no abdominal tenderness.  Musculoskeletal:     Cervical back: Normal range of motion and neck supple.     Right foot: Abnormal pulse.     Left foot: Abnormal pulse.     Comments: Right foot great toe noted with gangrene involving approx 3/4 of appendage. Unable to palpate DP in either foot  Skin:    General: Skin is warm and dry.  Neurological:     General: No focal deficit present.     Mental Status: She is alert.  Psychiatric:        Mood and Affect: Mood normal.      Consultants:  Nephrology Vascular surgery Orthopedic surgery  Procedures:    Data Reviewed: Results for orders placed or performed during the hospital encounter of 04/24/22 (from the past 24 hour(s))  Glucose, capillary     Status: Abnormal   Collection Time: 04/27/22  2:40 PM  Result Value Ref Range   Glucose-Capillary 114 (H) 70 - 99 mg/dL  Glucose, capillary     Status: Abnormal   Collection Time: 04/27/22  6:52 PM  Result Value Ref Range   Glucose-Capillary 173 (H) 70 - 99 mg/dL  Glucose, capillary     Status: Abnormal   Collection Time: 04/27/22  9:10 PM  Result Value Ref Range   Glucose-Capillary 213 (H) 70 - 99 mg/dL  CBC with Differential/Platelet     Status: Abnormal   Collection Time: 04/28/22 12:53 AM  Result Value Ref Range   WBC 12.7 (H) 4.0 - 10.5 K/uL   RBC 4.53 3.87 - 5.11 MIL/uL   Hemoglobin 11.2 (L)  12.0 - 15.0 g/dL   HCT 35.1 (L) 36.0 - 46.0 %   MCV 77.5 (L) 80.0 - 100.0 fL   MCH 24.7 (L) 26.0 - 34.0 pg   MCHC 31.9 30.0 - 36.0 g/dL   RDW 16.9 (H) 11.5 - 15.5 %   Platelets 161 150 - 400 K/uL   nRBC 0.9 (H) 0.0 - 0.2 %   Neutrophils Relative % 82 %   Neutro Abs 10.4 (H) 1.7 - 7.7 K/uL   Lymphocytes Relative 10 %   Lymphs Abs 1.3 0.7 - 4.0 K/uL   Monocytes Relative 5 %   Monocytes Absolute 0.7 0.1 - 1.0 K/uL   Eosinophils Relative 1 %   Eosinophils Absolute 0.1 0.0 - 0.5 K/uL   Basophils Relative 0 %   Basophils Absolute 0.1 0.0 - 0.1 K/uL  Immature Granulocytes 2 %   Abs Immature Granulocytes 0.27 (H) 0.00 - 0.07 K/uL  Magnesium     Status: None   Collection Time: 04/28/22 12:53 AM  Result Value Ref Range   Magnesium 2.1 1.7 - 2.4 mg/dL  Renal function panel     Status: Abnormal   Collection Time: 04/28/22 12:53 AM  Result Value Ref Range   Sodium 135 135 - 145 mmol/L   Potassium 3.7 3.5 - 5.1 mmol/L   Chloride 95 (L) 98 - 111 mmol/L   CO2 26 22 - 32 mmol/L   Glucose, Bld 255 (H) 70 - 99 mg/dL   BUN 45 (H) 8 - 23 mg/dL   Creatinine, Ser 5.00 (H) 0.44 - 1.00 mg/dL   Calcium 9.0 8.9 - 10.3 mg/dL   Phosphorus 5.5 (H) 2.5 - 4.6 mg/dL   Albumin 2.6 (L) 3.5 - 5.0 g/dL   GFR, Estimated 9 (L) >60 mL/min   Anion gap 14 5 - 15  Glucose, capillary     Status: Abnormal   Collection Time: 04/28/22  5:34 AM  Result Value Ref Range   Glucose-Capillary 214 (H) 70 - 99 mg/dL  Glucose, capillary     Status: Abnormal   Collection Time: 04/28/22 11:18 AM  Result Value Ref Range   Glucose-Capillary 166 (H) 70 - 99 mg/dL   *Note: Due to a large number of results and/or encounters for the requested time period, some results have not been displayed. A complete set of results can be found in Results Review.    I have reviewed pertinent nursing notes, vitals, labs, and images as necessary. I have ordered labwork to follow up on as indicated.  I have reviewed the last notes from staff  over past 24 hours. I have discussed patient's care plan and test results with nursing staff, CM/SW, and other staff as appropriate.  Time spent: Greater than 50% of the 55 minute visit was spent in counseling/coordination of care for the patient as laid out in the A&P.   LOS: 4 days   Dwyane Dee, MD [facility] 04/28/2022, 12:53 PM

## 2022-04-28 NOTE — Progress Notes (Signed)
Mobility Specialist - Progress Note   04/28/22 0953  Mobility  Activity Ambulated with assistance in hallway  Level of Assistance Contact guard assist, steadying assist  Assistive Device Front wheel walker  Distance Ambulated (ft) 100 ft  Activity Response Tolerated well  Mobility Referral Yes  $Mobility charge 1 Mobility    Pt received in bed agreeable to mobility. Took standing break x1, distance limited by c/o weakness in legs. Left in bed w/ call bell in reach and all needs met.   [place] Specialist Please contact via SecureChat or Rehab office at [phone]

## 2022-04-28 NOTE — Progress Notes (Signed)
Nutrition Follow-up  DOCUMENTATION CODES:  Not applicable  INTERVENTION:  Discontinue ProSource Plus Continue Nepro Shake po once daily, each supplement provides 425 kcal and 19 grams protein Renal MVI with minerals daily Recommend supplements   NUTRITION DIAGNOSIS:  Increased nutrient needs related to wound healing as evidenced by estimated needs. - remains applicable  GOAL:  Patient will meet greater than or equal to 90% of their needs - goal unmet, addressing via meals and nutrition supplements  MONITOR:  PO intake, Supplement acceptance, Labs, Weight trends, Skin, I & O's  REASON FOR ASSESSMENT:  Consult Diet education (Daughter requesting education)  ASSESSMENT:  67 year old female who presented to [facility] on 2/[facility] from OSH with toe infection. PMH of ESRD on HD (formerly on PD), T2DM, anemia, depression, HTN. Pt admitted with dry gangrene R great toe.  Plans for angiogram tomorrow.   Spoke with pt at bedside. Her family also present at time of visit. Pt reports that her appetite remained decreased. She did not enjoy ProSource supplements but has enjoyed [facility], will continue to offer during admission and encouraged ongoing use after discharge until PO intake improves or is back to baseline.   Meal completions: 2/3: 70% lunch 2/4: 45% breakfast, 75% dinner 2/5: 30% breakfast, 5% dinner  Pt's daughter present.   Requesting nutrition information on what specific diet guidelines pt should follow. Encouraged cessation of dark colas d/t phosphorus content. Encouraged pt to consume protein with each meal. Encouraged limited use of added salt to prevent fluid retention between HD sessions. We also discussed eating a balanced diet of fruits, vegetables and whole grains in addition to protein. Discussed while grocery shopping, choosing items that do not have added phos.   Pt had seen a RD at the dialysis center upon initiation of HD however has not seen one since, encouraged them  to request to be seen during HD session if additional questions arise. All questions and concerns have been addressed at this time. Encouraged them to have RN reach out if additional concerns/needs arise in the meantime.   Pt had seen a RD at the dialysis center upon initiation of HD however has not seen one since, encouraged them to request to be seen during HD session if additional questions arise.  Post HD weight: 86.2 kg  Medications: SSI 0-20 units TID, semglee 25 units daily, rena-vit, IV abx  Labs: BUN 45, Cr 5.00, Phos 5.5, GFR 9, CBG's 114-214 x24 hours  Post HD net UF: 2.5L I/O's: -3572m since admit  NUTRITION - FOCUSED PHYSICAL EXAM: Flowsheet Row Most Recent Value  Orbital Region No depletion  Upper Arm Region No depletion  Thoracic and Lumbar Region No depletion  Buccal Region No depletion  Temple Region No depletion  Clavicle Bone Region No depletion  Clavicle and Acromion Bone Region No depletion  Scapular Bone Region No depletion  Dorsal Hand No depletion  Patellar Region No depletion  Anterior Thigh Region No depletion  Posterior Calf Region No depletion  Edema (RD Assessment) None  Hair Reviewed  Eyes Reviewed  Mouth Reviewed  Skin Reviewed  Nails Reviewed       Diet Order:   Diet Order             Diet NPO time specified  Diet effective midnight           Diet Carb Modified Fluid consistency: Thin; Room service appropriate? Yes  Diet effective now  EDUCATION NEEDS:  Not appropriate for education at this time  Skin:  Skin Assessment: Skin Integrity Issues: Skin Integrity Issues:: Diabetic Ulcer Diabetic Ulcer: R great toe  Last BM:  2/6 (type 5)  Height:  Ht Readings from Last 1 Encounters:  04/24/22 '5\' 4"'$  (1.626 m)    Weight:  Wt Readings from Last 1 Encounters:  04/28/22 83.6 kg    Ideal Body Weight:  54.5 kg  BMI:  Body mass index is 31.64 kg/m.  Estimated Nutritional Needs:   Kcal:   1800-2000  Protein:  100-115 grams  Fluid:  1000 ml + UOP  [place] Dana, RDN, LDN Clinical Nutrition

## 2022-04-28 NOTE — Progress Notes (Signed)
  Care Coordination  Outreach Note  04/28/2022 Name: Kathryn Beck MRN: [id number] DOB: Nov 22, 1955   Care Coordination Outreach Attempts: An unsuccessful telephone outreach was attempted today to offer the patient information about available care coordination services as a benefit of their health plan.   Follow Up Plan:  Additional outreach attempts will be made to offer the patient care coordination information and services.   Encounter Outcome:  No Answer  [place]  Direct Dial: [phone]

## 2022-04-28 NOTE — Plan of Care (Signed)

## 2022-04-28 NOTE — Inpatient Diabetes Management (Signed)
Inpatient Diabetes Program Recommendations  AACE/ADA: New Consensus Statement on Inpatient Glycemic Control (2015)  Target Ranges:  Prepandial:   less than 140 mg/dL      Peak postprandial:   less than 180 mg/dL (1-2 hours)      Critically ill patients:  140 - 180 mg/dL   Lab Results  Component Value Date   GLUCAP 166 (H) 04/28/2022   HGBA1C 9.3 (A) 04/16/2022    Review of Glycemic Control  Latest Reference Range & Units 04/27/22 18:52 04/27/22 21:10 04/28/22 05:34 04/28/22 11:18  Glucose-Capillary 70 - 99 mg/dL 173 (H) 213 (H) 214 (H) 166 (H)  (H): Data is abnormally high Diabetes history: Type 2 DM Outpatient Diabetes medications: Glipizide 10 mg BID, Toujeo 60 units QD, Fiasp 18-30 units TID Current orders for Inpatient glycemic control: Semglee 25 units QD, Novolog 0-20 units TID   Inpatient Diabetes Program Recommendations:   Spoke with patient regarding outpatient diabetes medications. Reviewed patient's current A1c of 9.3%. Explained what a A1c is and what it measures. Also reviewed goal A1c with patient, importance of good glucose control @ home, and blood sugar goals. Reviewed patho of dka, survival skills, interventions, impact with ersd, infection, vascular changes and other commorbidities. Patient has all needed supplies. Reports checking CBGs two times daily. Reviewed when to reach out to PCP. Admits to drinking drinking sugary beverages; alternatives reviewed. Additionally, discussed plate method, important of protein and over all mindfulness.  No further questions at this time.   Thanks, Bronson Curb, MSN, RNC-OB Diabetes Coordinator [phone] (8a-5p)

## 2022-04-28 NOTE — Progress Notes (Signed)
Angiogram tomorrow. Please make NPO midnight

## 2022-04-28 NOTE — Progress Notes (Signed)
Pharmacy Antibiotic Note  Kathryn Beck is a 67 y.o. female admitted on 04/24/2022 with  osteomyelitis of L great toe . Pt transferred from [facility] for management of osteomyelitis on 04/24/22. Patient has ESRD and is now receiving HD on MWF schedule. Last iHD was on 04/27/22. Pharmacy has been consulted for vancomycin dosing.  WBC trending down to 12.7 Afebrile  Plan: Continue vancomycin '1000mg'$  IV qHD MWF   > Goal trough level 15-20   > Monitor pre-HD level at steady state Continue Ceftriaxone 2g IV q24h per MD Continue Metronidazole '500mg'$  IV q12h per MD Monitor daily CBC, temp, SCr, and for clinical signs of improvement  F/u cultures and de-escalate antibiotics as able  F/u surgical plans per VSS after angiogram on 04/28/22  Height: '5\' 4"'$  (162.6 cm) Weight: 83.6 kg (184 lb 4.9 oz) IBW/kg (Calculated) : 54.7  Temp (24hrs), Avg:97.9 F (36.6 C), Min:97.5 F (36.4 C), Max:98.6 F (37 C)  Recent Labs  Lab 04/25/22 0049 04/26/22 0031 04/27/22 0042 04/28/22 0053  WBC 30.2* 22.5* 17.1* 12.7*  CREATININE 8.15* 5.18* 6.87* 5.00*    Estimated Creatinine Clearance: 11.6 mL/min (A) (by C-G formula based on SCr of 5 mg/dL (H)).    Allergies  Allergen Reactions   Ace Inhibitors Anaphylaxis and Swelling   Penicillins Itching, Swelling and Other (See Comments)    Did it involve swelling of the face/tongue/throat, SOB, or low BP? Unknown Did it involve sudden or severe rash/hives, skin peeling, or any reaction on the inside of your mouth or nose? Unknown Did you need to seek medical attention at a hospital or doctor's office? Unknown When did it last happen?      years  If all above answers are "NO", may proceed with cephalosporin use.    Statins Other (See Comments)    elevated LFT's     Albuterol Swelling    Antimicrobials this admission: Vancomycin 2/2 (started at OSH) >> Ceftriaxone 2/3 >> Metronidazole 2/3 >> Levofloxacin 2/2 x1 (at OSH)  Dose adjustments this  admission: N/A  Microbiology results: None   Luisa Hart, PharmD, BCPS Clinical Pharmacist 04/28/2022 8:37 AM   Please refer to AMION for pharmacy phone number

## 2022-04-28 NOTE — TOC Progression Note (Signed)
Transition of Care [facility]) - Progression Note    Patient Details  Name: Kathryn Beck MRN: [id number] Date of Birth: May 07, 1955  [facility]) CM/SW Contact  Zenon Mayo, RN Phone Number: 04/28/2022, 4:23 PM  Clinical Narrative:     from home, gangreen on tod, for angiogram tomorrow, for possible amputation, HD patient MWF  TOC following.        Expected Discharge Plan and Services                                               Social Determinants of Health (SDOH) Interventions SDOH Screenings   Food Insecurity: No Food Insecurity (04/24/2022)  Housing: Low Risk  (04/24/2022)  Transportation Needs: No Transportation Needs (04/24/2022)  Utilities: Not At Risk (04/24/2022)  Alcohol Screen: Low Risk  (10/03/2019)  Depression (PHQ2-9): High Risk (04/21/2022)  Financial Resource Strain: High Risk (05/08/2021)  Physical Activity: Inactive (11/20/2021)  Social Connections: Socially Integrated (05/08/2021)  Stress: Stress Concern Present (11/20/2021)  Tobacco Use: Low Risk  (04/26/2022)    Readmission Risk Interventions     No data to display

## 2022-04-28 NOTE — Progress Notes (Signed)
  Kathryn Beck Progress Note   67 y.o. female DM, HTN, HLD, OSA ([place] w/ CPAP), ESRD dialyzed MWF at [facility] followed by Dr. Murlean Iba. Her last outpt dialysis treatment was on Wednesday and she missed her treatment on Friday. Patient here with worsening gangrene of the right great toe which she states has been declining over the past 2 weeks. WBC 28K Tm100.6.    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 Non due for Mircera yet. Transfuse as needed. 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 Tolerated HD Mon. Family bedside. For angiogram tomorrow.   No new issues.   Objective:   BP (!) 144/71   Pulse 69   Temp 97.6 F (36.4 C) (Oral)   Resp 16   Ht '5\' 4"'$  (1.626 m)   Wt 83.6 kg   SpO2 99%   BMI 31.64 kg/m   Intake/Output Summary (Last 24 hours) at 04/28/2022 1202 Last data filed at 04/27/2022 1755 Gross per 24 hour  Intake 0 ml  Output 2500 ml  Net -2500 ml    Weight change:   Physical Exam: General appearance: awake and comfortable Head: NCAT Resp: CTA b/l Cardio: RRR GI: SNDNT+BS Extremities: Rt great toe gangrene Access: lt BCF good thrill  Imaging: No results found.  Labs: BMET Recent Labs  Lab 04/25/22 0049 04/26/22 0031 04/27/22 0042 04/28/22 0053  NA 142 135 136 135  K 4.6 3.2* 4.1 3.7  CL 102 95* 97* 95*  CO2 '23 26 25 26  '$ GLUCOSE 142* 99 131* 255*  BUN 64* 36* 56* 45*  CREATININE 8.15* 5.18* 6.87* 5.00*  CALCIUM 8.6* 8.6* 9.1 9.0  PHOS 5.9* 4.3 7.5* 5.5*    CBC Recent Labs  Lab 04/25/22 0049 04/26/22 0031 04/27/22 0042 04/28/22 0053  WBC 30.2* 22.5* 17.1* 12.7*  NEUTROABS 27.8* 20.3* 14.9* 10.4*   HGB 10.2* 9.9* 9.9* 11.2*  HCT 32.1* 30.8* 31.0* 35.1*  MCV 78.7* 77.2* 77.3* 77.5*  PLT 291 164 168 161     Medications:     (feeding supplement) PROSource Plus  30 mL Oral BID BM   Chlorhexidine Gluconate Cloth  6 each Topical Q0600   DULoxetine  60 mg Oral Daily   ezetimibe  10 mg Oral Daily   feeding supplement (NEPRO CARB STEADY)  237 mL Oral Q24H   heparin  5,000 Units Subcutaneous Q8H   insulin aspart  0-20 Units Subcutaneous TID WC   insulin glargine-yfgn  25 Units Subcutaneous Daily   metoprolol succinate  50 mg Oral Daily   metroNIDAZOLE  500 mg Oral Q12H   multivitamin  1 tablet Oral QHS   pregabalin  50 mg Oral TID   sertraline  50 mg Oral Daily    Kathryn Hick MD [facility] Pager [phone]

## 2022-04-28 NOTE — Progress Notes (Signed)
Patient in hospital.

## 2022-04-29 DIAGNOSIS — N186 End stage renal disease: Secondary | ICD-10-CM | POA: Diagnosis not present

## 2022-04-29 DIAGNOSIS — E1165 Type 2 diabetes mellitus with hyperglycemia: Secondary | ICD-10-CM | POA: Diagnosis not present

## 2022-04-29 DIAGNOSIS — I739 Peripheral vascular disease, unspecified: Secondary | ICD-10-CM | POA: Diagnosis not present

## 2022-04-29 DIAGNOSIS — Z992 Dependence on renal dialysis: Secondary | ICD-10-CM | POA: Diagnosis not present

## 2022-04-29 LAB — CBC WITH DIFFERENTIAL/PLATELET
Abs Immature Granulocytes: 0.66 10*3/uL — ABNORMAL HIGH (ref 0.00–0.07)
Basophils Absolute: 0.1 10*3/uL (ref 0.0–0.1)
Basophils Relative: 1 %
Eosinophils Absolute: 0.1 10*3/uL (ref 0.0–0.5)
Eosinophils Relative: 1 %
HCT: 35.5 % — ABNORMAL LOW (ref 36.0–46.0)
Hemoglobin: 11.2 g/dL — ABNORMAL LOW (ref 12.0–15.0)
Immature Granulocytes: 5 %
Lymphocytes Relative: 13 %
Lymphs Abs: 1.9 10*3/uL (ref 0.7–4.0)
MCH: 24.5 pg — ABNORMAL LOW (ref 26.0–34.0)
MCHC: 31.5 g/dL (ref 30.0–36.0)
MCV: 77.7 fL — ABNORMAL LOW (ref 80.0–100.0)
Monocytes Absolute: 0.9 10*3/uL (ref 0.1–1.0)
Monocytes Relative: 7 %
Neutro Abs: 10.2 10*3/uL — ABNORMAL HIGH (ref 1.7–7.7)
Neutrophils Relative %: 73 %
Platelets: 184 10*3/uL (ref 150–400)
RBC: 4.57 MIL/uL (ref 3.87–5.11)
RDW: 17.1 % — ABNORMAL HIGH (ref 11.5–15.5)
WBC: 13.8 10*3/uL — ABNORMAL HIGH (ref 4.0–10.5)
nRBC: 0.6 % — ABNORMAL HIGH (ref 0.0–0.2)

## 2022-04-29 LAB — RENAL FUNCTION PANEL
Albumin: 2.6 g/dL — ABNORMAL LOW (ref 3.5–5.0)
Anion gap: 17 — ABNORMAL HIGH (ref 5–15)
BUN: 71 mg/dL — ABNORMAL HIGH (ref 8–23)
CO2: 27 mmol/L (ref 22–32)
Calcium: 9.3 mg/dL (ref 8.9–10.3)
Chloride: 96 mmol/L — ABNORMAL LOW (ref 98–111)
Creatinine, Ser: 7.18 mg/dL — ABNORMAL HIGH (ref 0.44–1.00)
GFR, Estimated: 6 mL/min — ABNORMAL LOW (ref >60)
Glucose, Bld: 215 mg/dL — ABNORMAL HIGH (ref 70–99)
Phosphorus: 6.8 mg/dL — ABNORMAL HIGH (ref 2.5–4.6)
Potassium: 3.7 mmol/L (ref 3.5–5.1)
Sodium: 140 mmol/L (ref 135–145)

## 2022-04-29 LAB — MAGNESIUM: Magnesium: 2.3 mg/dL (ref 1.7–2.4)

## 2022-04-29 LAB — GLUCOSE, CAPILLARY
Glucose-Capillary: 223 mg/dL — ABNORMAL HIGH (ref 70–99)
Glucose-Capillary: 119 mg/dL — ABNORMAL HIGH (ref 70–99)
Glucose-Capillary: 103 mg/dL — ABNORMAL HIGH (ref 70–99)
Glucose-Capillary: 146 mg/dL — ABNORMAL HIGH (ref 70–99)

## 2022-04-29 MED ORDER — AMLODIPINE BESYLATE 10 MG PO TABS
10.0000 mg | ORAL_TABLET | Freq: Every day | ORAL | Status: DC
  Administered 2022-04-30 – 2022-05-06 (×6): 10 mg via ORAL
  Filled 2022-04-29 (×6): qty 1

## 2022-04-29 MED ORDER — INSULIN ASPART 100 UNIT/ML IJ SOLN
3.0000 [IU] | Freq: Three times a day (TID) | INTRAMUSCULAR | Status: DC
  Administered 2022-05-01 – 2022-05-07 (×13): 3 [IU] via SUBCUTANEOUS

## 2022-04-29 MED ORDER — INSULIN ASPART 100 UNIT/ML IJ SOLN
0.0000 [IU] | Freq: Three times a day (TID) | INTRAMUSCULAR | Status: DC
  Administered 2022-05-01: 5 [IU] via SUBCUTANEOUS
  Administered 2022-05-01: 3 [IU] via SUBCUTANEOUS
  Administered 2022-05-01: 5 [IU] via SUBCUTANEOUS
  Administered 2022-05-02 (×2): 3 [IU] via SUBCUTANEOUS
  Administered 2022-05-02: 5 [IU] via SUBCUTANEOUS
  Administered 2022-05-03 – 2022-05-04 (×4): 3 [IU] via SUBCUTANEOUS
  Administered 2022-05-05: 8 [IU] via SUBCUTANEOUS
  Administered 2022-05-05: 2 [IU] via SUBCUTANEOUS
  Administered 2022-05-05: 3 [IU] via SUBCUTANEOUS
  Administered 2022-05-06: 5 [IU] via SUBCUTANEOUS
  Administered 2022-05-07: 2 [IU] via SUBCUTANEOUS

## 2022-04-29 MED ORDER — INSULIN GLARGINE-YFGN 100 UNIT/ML ~~LOC~~ SOLN
30.0000 [IU] | Freq: Every day | SUBCUTANEOUS | Status: DC
  Administered 2022-05-01 – 2022-05-07 (×7): 30 [IU] via SUBCUTANEOUS
  Filled 2022-04-29 (×8): qty 0.3

## 2022-04-29 NOTE — Progress Notes (Signed)
  [facility] Progress Note   67 y.o. female DM, HTN, HLD, OSA ([place] w/ CPAP), ESRD dialyzed MWF at [facility] followed by Dr. Murlean Iba. Her last outpt dialysis treatment was on Wednesday and she missed her treatment on Friday. Patient here with worsening gangrene of the right great toe which she states has been declining over the past 2 weeks. WBC 28K Tm100.6.    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  She's sleeping , did not awaken For angiogram today then dialysis.   Objective:   BP (!) 145/64 (BP Location: Right Arm)   Pulse 65   Temp 97.6 F (36.4 C) (Oral)   Resp 18   Ht '5\' 4"'$  (1.626 m)   Wt 83.6 kg   SpO2 93%   BMI 31.64 kg/m   Intake/Output Summary (Last 24 hours) at 04/29/2022 0814 Last data filed at 04/28/2022 1300 Gross per 24 hour  Intake 240 ml  Output --  Net 240 ml    Weight change:   Physical Exam: General appearance: sleeping comfortably Head: NCAT Resp: CTA b/l Cardio: RRR GI: SNDNT+BS Extremities: Rt great toe gangrene Access: lt BCF good thrill  Imaging: No results found.  Labs: BMET Recent Labs  Lab 04/25/22 0049 04/26/22 0031 04/27/22 0042 04/28/22 0053 04/29/22 0116  NA 142 135 136 135 140  K 4.6 3.2* 4.1 3.7 3.7  CL 102 95* 97* 95* 96*  CO2 '23 26 25 26 27  '$ GLUCOSE 481* 99 131* 255* 215*  BUN 64* 36* 56* 45* 71*  CREATININE 8.15* 5.18* 6.87* 5.00* 7.18*  CALCIUM 8.6* 8.6* 9.1 9.0 9.3  PHOS 5.9* 4.3 7.5* 5.5* 6.8*    CBC Recent Labs  Lab 04/26/22 0031 04/27/22 0042 04/28/22 0053 04/29/22 0116  WBC 22.5* 17.1*  12.7* 13.8*  NEUTROABS 20.3* 14.9* 10.4* 10.2*  HGB 9.9* 9.9* 11.2* 11.2*  HCT 30.8* 31.0* 35.1* 35.5*  MCV 77.2* 77.3* 77.5* 77.7*  PLT 164 168 161 184     Medications:     Chlorhexidine Gluconate Cloth  6 each Topical Q0600   DULoxetine  60 mg Oral Daily   ezetimibe  10 mg Oral Daily   feeding supplement (NEPRO CARB STEADY)  237 mL Oral Q24H   heparin  5,000 Units Subcutaneous Q8H   insulin aspart  0-20 Units Subcutaneous TID WC   insulin glargine-yfgn  25 Units Subcutaneous Daily   metoprolol succinate  50 mg Oral Daily   metroNIDAZOLE  500 mg Oral Q12H   multivitamin  1 tablet Oral QHS   pregabalin  50 mg Oral TID   sertraline  50 mg Oral Daily    Jannifer Hick MD [facility] Pager [phone]

## 2022-04-29 NOTE — Progress Notes (Signed)
Received patient in bed to unit.  Alert and oriented.  Informed consent signed and in chart.   TX duration:  Patient tolerated well.  Transported back to the room  Alert, without acute distress.  Hand-off given to patient's nurse.   Access used: AVF Access issues: none  Total UF removed: 3L Medication(s) given: Vanc Post HD VS: 130/59,66,97%,14,97.9 Post HD weight: 83.0kg   [facility]

## 2022-04-29 NOTE — Assessment & Plan Note (Addendum)
BMI 31 Dietitian evaluated patient, malnutrition ruled out.

## 2022-04-29 NOTE — Consult Note (Signed)
   [facility] Inpatient Consult   04/29/2022  Kathryn Beck [birth year] [id number]  [place]  Accountable Care Organization [ACO] Patient: [name]  Primary Care Provider:  Fayrene Helper, MD   Patient is currently  showing active with [place] [facility] for chronic disease management services.  Patient has been engaged by a [facility] LCSW.  Our community based plan of care has focused on disease management and community resource support.    Came by to speak with patient on rounds and patient is currently off the unit.  Plan:  Will reach out to [facility] LCSW and alert of patient's hospitalization and any follow up needs known for readmission prevention. Notified  Inpatient Transition Of Care [TOC] team member in unit progression meeting to make aware that [place] [facility] following.   Of note, [facility] Care Management services does not replace or interfere with any services that are needed or arranged by inpatient [facility] care management team.   For additional questions or referrals please contact:  Natividad Brood, RN BSN [place]  [phone] business mobile phone Toll free office [phone]  *[place]  [phone] Fax number: [phone] [email or web address]

## 2022-04-29 NOTE — Progress Notes (Signed)
Pt taken to dialysis when called for cath lab. Re-booked for tomorrow.  Please make NPO midnight .  Broadus John MD

## 2022-04-29 NOTE — Progress Notes (Signed)
  Progress Note   Patient: Kathryn Beck PQD:[id number] DOB: 02-17-1956 DOA: 04/24/2022     5 DOS: the patient was seen and examined on 04/29/2022        Brief hospital course: Ms. Greenspan is a 67 yo female with PMH DMII, HTN, HLD, ESRD on HD, depression who presented with a nonhealing wound on her R great toe. In the ER, this was concerning for gangrene.  She was started on antibiotics and admitted for further evaluation.     Assessment and Plan: Gangrene of toe of right foot (HCC) - Continue Rocephin, vancomycin, Flagyl - Consult vascular surgery and orthopedics for aortogram and amputation  PAD (peripheral artery disease) (HCC) ABI 2/3 with severe disease, right 0.4 and Left 0.42 -Consult vascular surgery, plan for angiogram  ESRD on hemodialysis [facility]) - Consult nephrology for routine HD  Type 2 diabetes mellitus with hyperglycemia (HCC) 29.3% - Continue Semglee - Continue sliding scale corrections, increase dose  Neuropathy - Continue Lyrica  HTN (hypertension) Blood pressure somewhat elevated - Continue amlodipine, metoprolol - Hold Imdur, clonidine  Depression - Continue Cymbalta and sertraline, and Lyrica  Chronic diastolic (congestive) heart failure (HCC) Appears euvolemic  Obesity BMI 31 Dietitian evaluated patient, malnutrition ruled out.  Hyperlipidemia - Continue Zetia - Hold statin due to transaminitis          Subjective: No new complaints, no fever, no confusion     Physical Exam: BP 131/65   Pulse 63   Temp 97.8 F (36.6 C) (Oral)   Resp 14   Ht '5\' 4"'$  (1.626 m)   Wt 83 kg   SpO2 95%   BMI 31.41 kg/m   Elderly adult female, lying in bed, no acute distress RRR, no murmurs, no peripheral edema Respiratory normal, lungs clear without rales or wheezes Abdomen soft no tenderness palpation The right foot has no redness or swelling around the great toe  Data Reviewed: Glucose elevated White blood cell count up to 13  Family  Communication: Husband and children at the bedside    Disposition: Status is: Inpatient The patient will require aortogram, then likely amputation of the toe or foot        Author: Edwin Dada, MD 04/29/2022 5:42 PM  For on call review [email or web address].

## 2022-04-29 NOTE — Progress Notes (Signed)
Visited patient and left paperwork for advance directive as patient;'s daughter had left for the day but will return tomorrow. Daughter has questions.   F/U returned to room however Patient was not available. Daughter request return visit tomorrow.

## 2022-04-29 NOTE — Progress Notes (Signed)
Mobility Specialist - Progress Note   04/29/22 0900  Mobility  Activity Stood at bedside (Sit>Stand x2)  Level of Assistance Minimal assist, patient does 75% or more  Assistive Device Front wheel walker  Activity Response Tolerated fair  Mobility Referral Yes  $Mobility charge 1 Mobility    Pt received in bed agreeable to mobility. Performed sit>stand x2 w/ MinA due to repetitive buckling in BLE. Sat bedside ~2 minutes before returning to supine. Will follow up as able for hallway ambulation. Left w/ call bell in reach and all needs met.   [place] Specialist Please contact via SecureChat or Rehab office at [phone]

## 2022-04-29 NOTE — Inpatient Diabetes Management (Signed)
Inpatient Diabetes Program Recommendations  AACE/ADA: New Consensus Statement on Inpatient Glycemic Control (2015)  Target Ranges:  Prepandial:   less than 140 mg/dL      Peak postprandial:   less than 180 mg/dL (1-2 hours)      Critically ill patients:  140 - 180 mg/dL   Lab Results  Component Value Date   GLUCAP 223 (H) 04/29/2022   HGBA1C 9.3 (A) 04/16/2022    Latest Reference Range & Units 04/28/22 05:34 04/28/22 11:18 04/28/22 16:12 04/28/22 21:16 04/29/22 04:50  Glucose-Capillary 70 - 99 mg/dL 214 (H) Novolog 7 units 166 (H) Novolog 4 units 230 (H) Novolog 7 units 182 (H) 223 (H) Novolog 7 units  (H): Data is abnormally high  Diabetes history: Type 2 DM Outpatient Diabetes medications: Glipizide 10 mg BID, Toujeo 60 units QD, Fiasp 18-30 units TID Current orders for Inpatient glycemic control: Semglee 25 units QD, Novolog 0-20 units TID  Inpatient Diabetes Program Recommendations:   Patient received 18 units Novolog correction over the past 24 hrs. Please consider: -Decrease Novolog correction to 0-9 units tid -Increase Semglee to 30 units qd -Add Novolog 3 units meal coverage (Hold if eats < 50% meals or NPO)   Thank you, Bethena Roys E. Santrice Muzio, RN, MSN, CDE  Diabetes Coordinator Inpatient Glycemic Control Team Team Pager [phone] (8am-5pm) 04/29/2022 10:46 AM

## 2022-04-30 ENCOUNTER — Inpatient Hospital Stay (HOSPITAL_COMMUNITY)
Admission: RE | Disposition: A | Discharge status: Home Health | Payer: Self-pay | Source: Home / Self Care | Attending: Family Medicine

## 2022-04-30 DIAGNOSIS — N186 End stage renal disease: Secondary | ICD-10-CM | POA: Diagnosis not present

## 2022-04-30 DIAGNOSIS — Z992 Dependence on renal dialysis: Secondary | ICD-10-CM | POA: Diagnosis not present

## 2022-04-30 DIAGNOSIS — I739 Peripheral vascular disease, unspecified: Secondary | ICD-10-CM | POA: Diagnosis not present

## 2022-04-30 DIAGNOSIS — E1165 Type 2 diabetes mellitus with hyperglycemia: Secondary | ICD-10-CM | POA: Diagnosis not present

## 2022-04-30 HISTORY — PX: ABDOMINAL AORTOGRAM W/LOWER EXTREMITY: CATH118223

## 2022-04-30 LAB — RENAL FUNCTION PANEL
Albumin: 2.7 g/dL — ABNORMAL LOW (ref 3.5–5.0)
Anion gap: 12 (ref 5–15)
BUN: 44 mg/dL — ABNORMAL HIGH (ref 8–23)
CO2: 27 mmol/L (ref 22–32)
Calcium: 8.7 mg/dL — ABNORMAL LOW (ref 8.9–10.3)
Chloride: 96 mmol/L — ABNORMAL LOW (ref 98–111)
Creatinine, Ser: 5.47 mg/dL — ABNORMAL HIGH (ref 0.44–1.00)
GFR, Estimated: 8 mL/min — ABNORMAL LOW (ref >60)
Glucose, Bld: 109 mg/dL — ABNORMAL HIGH (ref 70–99)
Phosphorus: 5 mg/dL — ABNORMAL HIGH (ref 2.5–4.6)
Potassium: 3.6 mmol/L (ref 3.5–5.1)
Sodium: 135 mmol/L (ref 135–145)

## 2022-04-30 LAB — CBC WITH DIFFERENTIAL/PLATELET
Abs Immature Granulocytes: 0.75 10*3/uL — ABNORMAL HIGH (ref 0.00–0.07)
Basophils Absolute: 0.1 10*3/uL (ref 0.0–0.1)
Basophils Relative: 1 %
Eosinophils Absolute: 0.1 10*3/uL (ref 0.0–0.5)
Eosinophils Relative: 0 %
HCT: 35.4 % — ABNORMAL LOW (ref 36.0–46.0)
Hemoglobin: 11.2 g/dL — ABNORMAL LOW (ref 12.0–15.0)
Immature Granulocytes: 5 %
Lymphocytes Relative: 12 %
Lymphs Abs: 2 10*3/uL (ref 0.7–4.0)
MCH: 24.7 pg — ABNORMAL LOW (ref 26.0–34.0)
MCHC: 31.6 g/dL (ref 30.0–36.0)
MCV: 78 fL — ABNORMAL LOW (ref 80.0–100.0)
Monocytes Absolute: 0.9 10*3/uL (ref 0.1–1.0)
Monocytes Relative: 6 %
Neutro Abs: 12.6 10*3/uL — ABNORMAL HIGH (ref 1.7–7.7)
Neutrophils Relative %: 76 %
Platelets: 149 10*3/uL — ABNORMAL LOW (ref 150–400)
RBC: 4.54 MIL/uL (ref 3.87–5.11)
RDW: 17 % — ABNORMAL HIGH (ref 11.5–15.5)
WBC: 16.4 10*3/uL — ABNORMAL HIGH (ref 4.0–10.5)
nRBC: 0.7 % — ABNORMAL HIGH (ref 0.0–0.2)

## 2022-04-30 LAB — GLUCOSE, CAPILLARY
Glucose-Capillary: 117 mg/dL — ABNORMAL HIGH (ref 70–99)
Glucose-Capillary: 106 mg/dL — ABNORMAL HIGH (ref 70–99)
Glucose-Capillary: 111 mg/dL — ABNORMAL HIGH (ref 70–99)
Glucose-Capillary: 104 mg/dL — ABNORMAL HIGH (ref 70–99)
Glucose-Capillary: 101 mg/dL — ABNORMAL HIGH (ref 70–99)
Glucose-Capillary: 174 mg/dL — ABNORMAL HIGH (ref 70–99)

## 2022-04-30 LAB — MAGNESIUM: Magnesium: 1.9 mg/dL (ref 1.7–2.4)

## 2022-04-30 SURGERY — ABDOMINAL AORTOGRAM W/LOWER EXTREMITY
Anesthesia: LOCAL

## 2022-04-30 MED ORDER — ONDANSETRON HCL 4 MG/2ML IJ SOLN: 4.0000 mg | Freq: Four times a day (QID) | INTRAMUSCULAR | Status: DC | PRN

## 2022-04-30 MED ORDER — HEPARIN (PORCINE) IN NACL 1000-0.9 UT/500ML-% IV SOLN
INTRAVENOUS | Status: DC | PRN
  Administered 2022-04-30 (×2): 500 mL

## 2022-04-30 MED ORDER — MIDAZOLAM HCL 2 MG/2ML IJ SOLN
INTRAMUSCULAR | Status: DC | PRN
  Administered 2022-04-30: 1 mg via INTRAVENOUS

## 2022-04-30 MED ORDER — LIDOCAINE HCL (PF) 1 % IJ SOLN
INTRAMUSCULAR | Status: DC | PRN
  Administered 2022-04-30: 12 mL

## 2022-04-30 MED ORDER — LABETALOL HCL 5 MG/ML IV SOLN: 10.0000 mg | INTRAVENOUS | Status: DC | PRN

## 2022-04-30 MED ORDER — NALOXONE HCL 0.4 MG/ML IJ SOLN
INTRAMUSCULAR | Status: AC
  Filled 2022-04-30: qty 1

## 2022-04-30 MED ORDER — IODIXANOL 320 MG/ML IV SOLN
INTRAVENOUS | Status: DC | PRN
  Administered 2022-04-30: 60 mL

## 2022-04-30 MED ORDER — HYDRALAZINE HCL 20 MG/ML IJ SOLN: 5.0000 mg | INTRAMUSCULAR | Status: DC | PRN

## 2022-04-30 MED ORDER — LIDOCAINE HCL (PF) 1 % IJ SOLN
INTRAMUSCULAR | Status: AC
  Filled 2022-04-30: qty 30

## 2022-04-30 MED ORDER — NALOXONE HCL 0.4 MG/ML IJ SOLN
INTRAMUSCULAR | Status: DC | PRN
  Administered 2022-04-30: .4 mg via INTRAVENOUS

## 2022-04-30 MED ORDER — FENTANYL CITRATE (PF) 100 MCG/2ML IJ SOLN
INTRAMUSCULAR | Status: AC
  Filled 2022-04-30: qty 2

## 2022-04-30 MED ORDER — ACETAMINOPHEN 325 MG PO TABS: 650.0000 mg | ORAL_TABLET | ORAL | Status: DC | PRN

## 2022-04-30 MED ORDER — FLUMAZENIL 1 MG/10ML IV SOLN
INTRAVENOUS | Status: AC
  Filled 2022-04-30: qty 10

## 2022-04-30 MED ORDER — FENTANYL CITRATE (PF) 100 MCG/2ML IJ SOLN
INTRAMUSCULAR | Status: DC | PRN
  Administered 2022-04-30: 50 ug via INTRAVENOUS

## 2022-04-30 MED ORDER — HEPARIN (PORCINE) IN NACL 1000-0.9 UT/500ML-% IV SOLN
INTRAVENOUS | Status: AC
  Filled 2022-04-30: qty 1000

## 2022-04-30 MED ORDER — FLUMAZENIL 1 MG/10ML IV SOLN
INTRAVENOUS | Status: DC | PRN
  Administered 2022-04-30: .4 mg via INTRAVENOUS

## 2022-04-30 MED ORDER — MIDAZOLAM HCL 2 MG/2ML IJ SOLN
INTRAMUSCULAR | Status: AC
  Filled 2022-04-30: qty 2

## 2022-04-30 SURGICAL SUPPLY — 11 items
CATH OMNI FLUSH 5F 65CM (CATHETERS) IMPLANT
KIT MICROPUNCTURE NIT STIFF (SHEATH) IMPLANT
KIT PV (KITS) ×1 IMPLANT
SHEATH PINNACLE 5F 10CM (SHEATH) IMPLANT
SHEATH PROBE COVER 6X72 (BAG) IMPLANT
STOPCOCK MORSE 400PSI 3WAY (MISCELLANEOUS) IMPLANT
SYR MEDRAD MARK 7 150ML (SYRINGE) ×1 IMPLANT
TRANSDUCER W/STOPCOCK (MISCELLANEOUS) ×1 IMPLANT
TRAY PV CATH (CUSTOM PROCEDURE TRAY) ×1 IMPLANT
TUBING CIL FLEX 10 FLL-RA (TUBING) IMPLANT
WIRE BENTSON .035X145CM (WIRE) IMPLANT

## 2022-04-30 NOTE — Progress Notes (Signed)
  [facility] Progress Note   67 y.o. female DM, HTN, HLD, OSA ([place] w/ CPAP), ESRD dialyzed MWF at [facility] followed by Dr. Murlean Iba. Her last outpt dialysis treatment was on Wednesday and she missed her treatment on Friday. Patient here with worsening gangrene of the right great toe which she states has been declining over the past 2 weeks. WBC 28K Tm100.6.    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   Subjective:   HD yesterday with 3L LE angio rescheduled for today due to dialysis.    Objective:   BP (!) 138/55 (BP Location: Right Arm)   Pulse 68   Temp 98.6 F (37 C) (Oral)   Resp 17   Ht '5\' 4"'$  (1.626 m)   Wt 80.2 kg   SpO2 95%   BMI 30.35 kg/m   Intake/Output Summary (Last 24 hours) at 04/30/2022 1018 Last data filed at 04/30/2022 0805 Gross per 24 hour  Intake 0 ml  Output 3000 ml  Net -3000 ml    Weight change:   Physical Exam: General appearance: sleeping comfortably Head: NCAT Resp: CTA b/l Cardio: RRR GI: SNDNT+BS Extremities: Rt great toe gangrene Access: lt BCF good thrill  Imaging: No results found.  Labs: BMET Recent Labs  Lab 04/25/22 0049 04/26/22 0031 04/27/22 0042 04/28/22 0053 04/29/22 0116 04/30/22 0036  NA 142 135 136 135 140 135  K 4.6 3.2* 4.1 3.7 3.7 3.6  CL 102 95* 97* 95* 96* 96*  CO2 '23 26 25 26 27 27  '$ GLUCOSE 142* 99 131* 255* 215* 109*  BUN 64* 36* 56* 45* 71* 44*  CREATININE 8.15* 5.18* 6.87* 5.00* 7.18* 5.47*  CALCIUM 8.6* 8.6* 9.1 9.0 9.3 8.7*  PHOS 5.9* 4.3 7.5* 5.5* 6.8* 5.0*    CBC Recent Labs  Lab  04/27/22 0042 04/28/22 0053 04/29/22 0116 04/30/22 0036  WBC 17.1* 12.7* 13.8* 16.4*  NEUTROABS 14.9* 10.4* 10.2* 12.6*  HGB 9.9* 11.2* 11.2* 11.2*  HCT 31.0* 35.1* 35.5* 35.4*  MCV 77.3* 77.5* 77.7* 78.0*  PLT 168 161 184 149*     Medications:     amLODipine  10 mg Oral Daily   Chlorhexidine Gluconate Cloth  6 each Topical Q0600   DULoxetine  60 mg Oral Daily   ezetimibe  10 mg Oral Daily   feeding supplement (NEPRO CARB STEADY)  237 mL Oral Q24H   heparin  5,000 Units Subcutaneous Q8H   insulin aspart  0-15 Units Subcutaneous TID WC   insulin aspart  3 Units Subcutaneous TID WC   insulin glargine-yfgn  30 Units Subcutaneous Daily   metoprolol succinate  50 mg Oral Daily   metroNIDAZOLE  500 mg Oral Q12H   multivitamin  1 tablet Oral QHS   pregabalin  50 mg Oral TID   sertraline  50 mg Oral Daily    Jannifer Hick MD [facility] Pager [phone]

## 2022-04-30 NOTE — Progress Notes (Signed)
Patient left groin incision checked upon arrival back to floor. Dressing is clean and dry with no signs of bleeding or hematoma. Site was also just rechecked and no changes.

## 2022-04-30 NOTE — Progress Notes (Signed)
[facility] [place] PROGRESS NOTE  ASSESSMENT / PLAN: Kathryn Beck is a 67 y.o. female with right great toe gangrene. Plan angiography today.   SUBJECTIVE: No complaints. Ready for cath lab.  OBJECTIVE: BP 129/60   Pulse 64   Temp 98.1 F (36.7 C) (Oral)   Resp 13   Ht '5\' 4"'$  (1.626 m)   Wt 80.2 kg   SpO2 98%   BMI 30.35 kg/m   Intake/Output Summary (Last 24 hours) at 04/30/2022 1544 Last data filed at 04/30/2022 0805 Gross per 24 hour  Intake 0 ml  Output 3000 ml  Net -3000 ml    Chronically ill No distress Regular rate and rhythm Unlabored breathing R great toe gangrene 2+ L femoral pulse     Latest Ref Rng & Units 04/30/2022   12:36 AM 04/29/2022    1:16 AM 04/28/2022   12:53 AM  CBC  WBC 4.0 - 10.5 K/uL 16.4  13.8  12.7   Hemoglobin 12.0 - 15.0 g/dL 11.2  11.2  11.2   Hematocrit 36.0 - 46.0 % 35.4  35.5  35.1   Platelets 150 - 400 K/uL 149  184  161         Latest Ref Rng & Units 04/30/2022   12:36 AM 04/29/2022    1:16 AM 04/28/2022   12:53 AM  CMP  Glucose 70 - 99 mg/dL 109  215  255   BUN 8 - 23 mg/dL 44  71  45   Creatinine 0.44 - 1.00 mg/dL 5.47  7.18  5.00   Sodium 135 - 145 mmol/L 135  140  135   Potassium 3.5 - 5.1 mmol/L 3.6  3.7  3.7   Chloride 98 - 111 mmol/L 96  96  95   CO2 22 - 32 mmol/L '27  27  26   '$ Calcium 8.9 - 10.3 mg/dL 8.7  9.3  9.0     Estimated Creatinine Clearance: 10.4 mL/min (A) (by C-G formula based on SCr of 5.47 mg/dL (H)).  Yevonne Aline. Stanford Breed, MD [facility] Phone Number: [phone] 04/30/2022 3:44 PM

## 2022-04-30 NOTE — Op Note (Signed)
DATE OF SERVICE: 04/30/2022  PATIENT:  Kathryn Beck  67 y.o. 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  PATIENT DISPOSITION:  PACU - hemodynamically stable.   Delay start of Pharmacological VTE agent (>24hrs) due to surgical blood loss or risk of bleeding: no  INDICATION FOR PROCEDURE: BZEMIRA ZEHRINGis a 67y.o. female with right great toe gangrene. After careful discussion of risks, benefits, and alternatives the patient was offered angiography. The patient understood and wished to proceed.  OPERATIVE FINDINGS:  Patient developed respiratory failure and required rescue to improve oxygenation and ventilation. Ultimately reversed sedation, and placed oral airway with good result.  Terminal aorta and iliac arteries: Widely patent  Right lower extremity: Common femoral artery: Focal plaque causing flow limiting stenosis  Profunda femoris artery: diseased, but patent  Superficial femoral artery: diffusely diseased. Multifocal stenosis - greatest 90%. Popliteal artery: diffusely diseased but patent Anterior tibial artery: diffusely diseased, occluded proximally. Reconstitutes distally, but heavily diseased. Tibioperoneal trunk: widely patent Peroneal artery: widely patent proximally, but does not opacify in mid leg. Posterior tibial artery: small, but patent proximally. Diffuse disease. Critical disease in distal 1/3rd.  Pedal circulation: fills via reconstituted AT and PT  GLASS score. FP  4. IP 4. Stage III.  WIfI score. 2 / 3 / 0. Stage IV.  DESCRIPTION OF PROCEDURE: After identification of the patient in the pre-operative holding area, the patient was transferred to the operating room. The patient was positioned supine on the operating room table. Anesthesia was induced. The groins was prepped and draped in standard fashion. A surgical pause was performed confirming correct patient, procedure, and operative location.  The left groin was anesthetized with subcutaneous injection of 1% lidocaine. Using ultrasound guidance, the left common femoral artery was accessed with micropuncture technique. Fluoroscopy was used to confirm cannulation over the femoral head. The 30F sheath was upsized to 53F.   Shortly after the case began, the patient suffered respiratory failure.  She maintained a pulse throughout the case.  Her oxygen saturation dropped.  Sedation was reversed.  Patient had improvement in oxygenation and ventilation.  A Benson wire was advanced into the distal aorta. Over the wire an omni flush catheter was advanced to the level of L2. Aortogram was performed - see above for details.   The right common iliac artery was selected with an omniflush catheter and Bentson guidewire. The wire was advanced into the common femoral artery. Over the wire the omni flush catheter was advanced into the external iliac artery. Selective angiography was performed - see above for details.   The sheath was left in place to be removed in the recovery area.  Conscious sedation was administered with the use of IV fentanyl and midazolam under continuous physician and nurse monitoring.  Heart rate, blood pressure, and oxygen saturation were continuously monitored.  Total sedation time was 23 minutes  Upon completion of the case instrument and sharps counts were confirmed correct. The patient was transferred to the PACU in good condition. I was present for all  portions of the procedure.  PLAN: Aspirin  81 mg by mouth daily.  High intensity statin therapy.  Will discuss revascularization options with her tomorrow.  Yevonne Aline. Stanford Breed, MD [facility] Phone Number: [phone] 04/30/2022 3:50 PM

## 2022-04-30 NOTE — Progress Notes (Signed)
  Progress Note   Patient: Kathryn Beck OMB:[id number] DOB: [birth year] DOA: 04/24/2022     6 DOS: the patient was seen and examined on 04/30/2022 at 10:52AM      Brief hospital course: Kathryn Beck is a 67 y.o. F with ESRD on HD MWF, DM, HTN, OSA not on CPAP, obesity, and depression who presented with nonhealing great toe wound.        Assessment and Plan: * Gangrene of toe of right foot ([place]) - Continue antibiotics - Consult vascular surgery and orthopedics for aortogram and amputation  PAD (peripheral artery disease) (HCC) ABI 2/3 with severe disease, right 0.4 and Left 0.42 Angiogram 2/8 with diffuse disease -Consult vascular surgery, appreciate cares - Continue Zetia - Intolerant of statins - Start aspirin   ESRD on hemodialysis ([place]) - Consult nephrology for routine HD  Type 2 diabetes mellitus with hyperglycemia ([place]) A1c 9.3% Glucose here well controlled now - Continue Semglee - Continue sliding scale corrections   Neuropathy - Continue Lyrica  HTN (hypertension) Blood pressure controlled - Continue amlodipine, metoprolol - Hold Imdur, clonidine  Depression - Continue Cymbalta and sertraline, and Lyrica  Chronic diastolic (congestive) heart failure (HCC) Appears euvolemic  Obesity BMI 31 Dietitian evaluated patient, malnutrition ruled out.  Hyperlipidemia - Continue Zetia - Hold statin due to transaminitis          Subjective: No changes, feet feel "the same".       Physical Exam: BP (!) 128/51   Pulse 60   Temp 98.1 F (36.7 C) (Oral)   Resp 11   Ht '5\' 4"'$  (1.626 m)   Wt 80.2 kg   SpO2 99%   BMI 30.35 kg/m   Elderly adult female, lying in bed, interactive and appropriate RRR, no murmurs, no peripheral edema Respiratory rate normal, lungs clear without rales or wheezes Abdomen soft without tenderness palpation or guarding   Data Reviewed: White blood cell count up to 16, hemoglobin 11 Magnesium normal Creatinine up to  5.4  Family Communication: None present    Disposition: Status is: Inpatient         Author: Edwin Dada, MD 04/30/2022 5:10 PM  For on call review [email or web address].

## 2022-04-30 NOTE — Progress Notes (Signed)
Mobility Specialist - Progress Note   04/30/22 1100  Mobility  Activity Ambulated with assistance in hallway  Level of Assistance Minimal assist, patient does 75% or more  Assistive Device Front wheel walker  Distance Ambulated (ft) 80 ft  Activity Response Tolerated well  Mobility Referral Yes  $Mobility charge 1 Mobility    Pt received in bed agreeable to mobility. MinA d/t buckling x5 in BLE. Required extended seated rest break x1 in hallway. Left in bed w/ all needs met and call bell by her side.   Hall Specialist Please contact via SecureChat or Rehab office at [phone]

## 2022-04-30 NOTE — Progress Notes (Signed)
Patient wound dressing changed per wound care orders with no complications. 2/3rds of great toe is black.

## 2022-04-30 NOTE — Progress Notes (Addendum)
SITE AREA: left femoral/groin  SITE PRIOR TO REMOVAL:  LEVEL 0  PRESSURE APPLIED FOR: approximately 20 minutes  MANUAL: yes  PATIENT STATUS DURING PULL: resting with eyes closed, but arousable to voice, appropriate with responses  POST PULL SITE:  LEVEL 0  POST PULL INSTRUCTIONS GIVEN: yes  POST PULL PULSES PRESENT: bilateral pos tibial pulses dopplerable  DRESSING APPLIED: gauze with tegaderm  BEDREST BEGINS @ 1703   COMMENTS: Dr. Stanford Breed to bedside to checl pt Right great toe blackened in color towards top/tip, no drainage noted, OTA

## 2022-05-01 ENCOUNTER — Inpatient Hospital Stay (HOSPITAL_COMMUNITY): Payer: Medicare HMO

## 2022-05-01 DIAGNOSIS — E1165 Type 2 diabetes mellitus with hyperglycemia: Secondary | ICD-10-CM | POA: Diagnosis not present

## 2022-05-01 DIAGNOSIS — I739 Peripheral vascular disease, unspecified: Secondary | ICD-10-CM | POA: Diagnosis not present

## 2022-05-01 DIAGNOSIS — I96 Gangrene, not elsewhere classified: Secondary | ICD-10-CM | POA: Diagnosis not present

## 2022-05-01 DIAGNOSIS — L0390 Cellulitis, unspecified: Secondary | ICD-10-CM

## 2022-05-01 DIAGNOSIS — N186 End stage renal disease: Secondary | ICD-10-CM | POA: Diagnosis not present

## 2022-05-01 DIAGNOSIS — Z992 Dependence on renal dialysis: Secondary | ICD-10-CM | POA: Diagnosis not present

## 2022-05-01 LAB — RENAL FUNCTION PANEL
Albumin: 2.6 g/dL — ABNORMAL LOW (ref 3.5–5.0)
Anion gap: 18 — ABNORMAL HIGH (ref 5–15)
BUN: 61 mg/dL — ABNORMAL HIGH (ref 8–23)
CO2: 25 mmol/L (ref 22–32)
Calcium: 8.5 mg/dL — ABNORMAL LOW (ref 8.9–10.3)
Chloride: 93 mmol/L — ABNORMAL LOW (ref 98–111)
Creatinine, Ser: 7.51 mg/dL — ABNORMAL HIGH (ref 0.44–1.00)
GFR, Estimated: 6 mL/min — ABNORMAL LOW (ref >60)
Glucose, Bld: 235 mg/dL — ABNORMAL HIGH (ref 70–99)
Phosphorus: 7.8 mg/dL — ABNORMAL HIGH (ref 2.5–4.6)
Potassium: 3.7 mmol/L (ref 3.5–5.1)
Sodium: 136 mmol/L (ref 135–145)

## 2022-05-01 LAB — LIPID PANEL
Cholesterol: 116 mg/dL (ref 0–200)
HDL: 26 mg/dL — ABNORMAL LOW (ref >40)
LDL Cholesterol: 31 mg/dL (ref 0–99)
Total CHOL/HDL Ratio: 4.5 RATIO
Triglycerides: 294 mg/dL — ABNORMAL HIGH (ref <150)
VLDL: 59 mg/dL — ABNORMAL HIGH (ref 0–40)

## 2022-05-01 LAB — CBC
HCT: 34.6 % — ABNORMAL LOW (ref 36.0–46.0)
Hemoglobin: 10.9 g/dL — ABNORMAL LOW (ref 12.0–15.0)
MCH: 24.8 pg — ABNORMAL LOW (ref 26.0–34.0)
MCHC: 31.5 g/dL (ref 30.0–36.0)
MCV: 78.6 fL — ABNORMAL LOW (ref 80.0–100.0)
Platelets: 218 10*3/uL (ref 150–400)
RBC: 4.4 MIL/uL (ref 3.87–5.11)
RDW: 17.4 % — ABNORMAL HIGH (ref 11.5–15.5)
WBC: 9.6 10*3/uL (ref 4.0–10.5)
nRBC: 2.2 % — ABNORMAL HIGH (ref 0.0–0.2)

## 2022-05-01 LAB — GLUCOSE, CAPILLARY
Glucose-Capillary: 236 mg/dL — ABNORMAL HIGH (ref 70–99)
Glucose-Capillary: 159 mg/dL — ABNORMAL HIGH (ref 70–99)
Glucose-Capillary: 238 mg/dL — ABNORMAL HIGH (ref 70–99)
Glucose-Capillary: 119 mg/dL — ABNORMAL HIGH (ref 70–99)

## 2022-05-01 MED ORDER — HYDROMORPHONE HCL 1 MG/ML IJ SOLN
1.0000 mg | Freq: Once | INTRAMUSCULAR | Status: AC
  Administered 2022-05-01: 1 mg via INTRAVENOUS
  Filled 2022-05-01: qty 1

## 2022-05-01 MED ORDER — SODIUM CHLORIDE 0.9% FLUSH
3.0000 mL | Freq: Two times a day (BID) | INTRAVENOUS | Status: DC
  Administered 2022-05-01 – 2022-05-07 (×10): 3 mL via INTRAVENOUS

## 2022-05-01 MED ORDER — SODIUM CHLORIDE 0.9% FLUSH: 3.0000 mL | INTRAVENOUS | Status: DC | PRN

## 2022-05-01 MED ORDER — SODIUM CHLORIDE 0.9 % IV SOLN: 250.0000 mL | INTRAVENOUS | Status: DC | PRN

## 2022-05-01 MED FILL — Lidocaine HCl Local Preservative Free (PF) Inj 1%: INTRAMUSCULAR | Qty: 30 | Status: AC

## 2022-05-01 MED FILL — Heparin Sod (Porcine)-NaCl IV Soln 1000 Unit/500ML-0.9%: INTRAVENOUS | Qty: 1000 | Status: AC

## 2022-05-01 NOTE — Progress Notes (Signed)
Will be seeing patient later today.  Patient with severe, multilevel occlusive disease throughout the right lower extremity.  I have ordered an ultrasound to further evaluate the common femoral artery as this will impact her operative plan.  I spoke to Kathryn Beck and her family yesterday evening outlining that she is at high risk of limb loss regardless of the intervention due to her microvascular and tibial disease.  Final plan, and OR timing pending CFA study.

## 2022-05-01 NOTE — Progress Notes (Signed)
Approximately 0738--Pt transported to HD with transporter. Pt A&O x 4, no complaints of pain at this time. Husband at bedside. RN to complete full assessment and administer scheduled meds promptly upon pt return to room.

## 2022-05-01 NOTE — Progress Notes (Signed)
Report given to hemodialysis RN.

## 2022-05-01 NOTE — Progress Notes (Signed)
  Progress Note   Patient: Kathryn Beck SEG:[id number] DOB: [birth year] DOA: 04/24/2022     7 DOS: the patient was seen and examined on 05/01/2022 at 9:58AM while on HD      Brief hospital course: Kathryn Beck is a 67 y.o. F with ESRD on HD MWF, DM, HTN, OSA not on CPAP, obesity, and depression who presented with nonhealing great toe wound.        Assessment and Plan: * Gangrene of toe of right foot ([place]) - Continue antibiotics - Consult vascular surgery and orthopedics for aortogram and amputation  PAD (peripheral artery disease) (HCC) ABI 2/3 with severe disease, right 0.4 and Left 0.42 Angiogram 2/8 with diffuse disease -Consult vascular surgery, appreciate cares - Continue Zetia - Intolerant of statins - Start aspirin   ESRD on hemodialysis ([place]) - Consult nephrology for routine HD  Type 2 diabetes mellitus with hyperglycemia ([place]) A1c 9.3% Glucose here well controlled now - Continue Semglee - Continue sliding scale corrections   Neuropathy - Continue Lyrica  HTN (hypertension) Blood pressure controlled - Continue amlodipine, metoprolol - Hold Imdur, clonidine  Depression - Continue Cymbalta and sertraline, and Lyrica  Chronic diastolic (congestive) heart failure (HCC) Appears euvolemic  Obesity BMI 31 Dietitian evaluated patient, malnutrition ruled out.  Hyperlipidemia - Continue Zetia - Hold statin due to transaminitis          Subjective: Seen on dialysis, sleeping comfortable, no change in foot, had angiogram yesterday, is waiting to talk to vascular about options.  No new complaints.     Physical Exam: BP (!) 125/48 (BP Location: Right Arm)   Pulse 62   Temp 97.8 F (36.6 C) (Oral)   Resp 18   Ht '5\' 4"'$  (1.626 m)   Wt 77.9 kg   SpO2 94%   BMI 29.48 kg/m   Elderly adult female, lying in bed, on dialysis, no acute distress, interactive and appropriate RRR, no murmurs, no peripheral edema, bilateral radial pulses normal Respiratory  rate normal, lungs clear without rales or wheezes Abdomen soft without tenderness palpation or guarding Attention normal, affect appropriate, moves upper extremities with generalized weakness but symmetric strength    Data Reviewed: No new labs  Family Communication: None present    Disposition: Status is: Inpatient Per vascular surgery and orthopedics        Author: Edwin Dada, MD 05/01/2022 4:17 PM  For on call review [email or web address].

## 2022-05-01 NOTE — Progress Notes (Signed)
Received patient in bed to unit.  Alert and oriented.  Informed consent signed and in chart.   [place] duration:3:58  Patient tolerated well.  Transported back to the room  Alert, without acute distress.  Hand-off given to patient's nurse.   Access used: left AVF Access issues: none  Total UF removed: 1.9L Medication(s) given: none    05/01/22 1330  Vitals  Temp 97.8 F (36.6 C)  Temp Source Oral  BP (!) 139/57  MAP (mmHg) 81  BP Location Right Arm  BP Method Automatic  Patient Position (if appropriate) Lying  Pulse Rate 63  Pulse Rate Source Monitor  ECG Heart Rate 64  Resp 12  Oxygen Therapy  SpO2 96 %  O2 Device Room Air  During Treatment Monitoring  HD Safety Checks Performed Yes  Intra-Hemodialysis Comments Tolerated well;Tx completed  Post Treatment  Dialyzer Clearance Heavily streaked  Duration of HD Treatment -hour(s) 3.58 hour(s)  Liters Processed 85.1  Fluid Removed (mL) 1900 mL  Tolerated HD Treatment Yes  Post-Hemodialysis Comments cartridge clotted x2  AVG/AVF Arterial Site Held (minutes) 8 minutes  AVG/AVF Venous Site Held (minutes) 8 minutes  Fistula / Graft Left Upper arm Arteriovenous fistula  Placement Date/Time: 09/02/17 1150   Placed prior to admission: Yes  Orientation: Left  Access Location: Upper arm  Access Type: Arteriovenous fistula  Site Condition No complications  Fistula / Graft Assessment Present;Thrill;Bruit  Status Deaccessed      Joshua Zeringue S [facility]

## 2022-05-01 NOTE — Progress Notes (Signed)
PHARMACIST LIPID MONITORING   Kathryn Beck is a 67 y.o. female admitted on 04/24/2022 with gangrene of R great toe due to PAD. Pt is s/p aortogram and RLE angiogram on 2/8. Pharmacy has been consulted to optimize lipid-lowering therapy with the indication of secondary prevention for clinical ASCVD.  Recent Labs:  Lipid Panel (last 6 months):   Lab Results  Component Value Date   CHOL 116 05/01/2022   TRIG 294 (H) 05/01/2022   HDL 26 (L) 05/01/2022   CHOLHDL 4.5 05/01/2022   VLDL 59 (H) 05/01/2022   LDLCALC 31 05/01/2022    Hepatic function panel (last 6 months):   Lab Results  Component Value Date   AST 25 04/25/2022   ALT 37 04/25/2022   ALKPHOS 70 04/25/2022   BILITOT 0.6 04/25/2022    SCr (since admission):   Serum creatinine: 7.51 mg/dL (H) 05/01/22 0039 Estimated creatinine clearance: 7.5 mL/min (A)  Current therapy and lipid therapy tolerance Current lipid-lowering therapy: ezetimibe 23m daily Previous lipid-lowering therapies (if applicable):  Lovastatin 261mdaily  08/24/07 > 01/31/08 Lovastatin 402maily 02/01/08 > 01/28/09 Simvastatin-niacin 7m8m0mg daily 01/28/09 > 08/25/10 Pravastatin 20-80mg daily off and on at various doses 08/25/10 > 01/27/17 Atorvastatin 7mg24mly 01/27/17 > 11/09/17 Documented or reported allergies or intolerances to lipid-lowering therapies (if applicable):  -Elevated LFTs (AST 74/ALT 67) with pravastatin 80mg 80my in 01/2011, improved with discontinuation. -Resumed pravastatin 80mg 339m 08/13 then increased to 80mg 4x34min 12/13, then switched to pravastatin 7mg dai32mn 01/14, then dose incr'd to 40mg 02/131mnd further to 80mg daily26m06/14. Then in 07/14, LFTs were again elevated (AST 101/ALT 90) on 80mg daily 26mpravastatin was discontinued.  -Pravastatin resumed at 10mg daily i27m/14 and continued until 10/15. Dose was increased to 7mg in 10/1557m then further increased 2 weeks later to 40mg daily and83mtinued until 04/17, when  pravastatin was discontinued again for elevated LFTs.  -In 01/2016, pravastatin 7mg daily was 63mtarted with ezetimibe 10mg daily and c62mnued on this regimen until 4/18, when she was switched to rosuvastatin 5mg daily with ez66mmibe 10mg daily. In 11/60mrosuvastatin was switched to atorvastatin 7mg daily to be ta30mwith ezetimibe until it was discontinued in 08/19 after an ED visit. Of note, LFTs were normal on 11/18/17 after ED visit.  Assessment:   Patient is below goal LDL <50 on ezetimibe 10mg daily. HDL is l59mnd TG are elevated.   Plan:    1.Statin intensity (high intensity recommended for all patients regardless of the LDL):  Statin intolerance noted. No statin changes due to serious side effects (elevated LFTs) with high dose statins in the past. However, could consider low dose or 3-4x/wk doses of statin in the future if LDL goal is not met (see notes above on doses pt has previously tolerated). Nevertheless, patient is currently at goal LDL <50 without statin therapy.   2.Add ezetimibe (if any one of the following): Continue ezetimibe 10mg daily.  3.Refer 109mipid clinic:   No  4.Follow-up with:  Primary care provider - Simpson, Margaret E, MFayrene Helper after discharge:  No changes in lipid therapy, repeat a lipid panel in one year.      Ayjah Show, PharmD, Luisa Hartl Pharmacist 05/01/2022 1:16 PM   Please refer to [facility]

## 2022-05-01 NOTE — Progress Notes (Signed)
Lower extremity arterial duplex right study completed.  Preliminary results relayed to Virl Cagey, MD.   See CV Proc for preliminary results report.   Darlin Coco, RDMS, RVT

## 2022-05-01 NOTE — Plan of Care (Signed)
  Problem: Education: Goal: Knowledge of General Education information will improve Description: Including pain rating scale, medication(s)/side effects and non-pharmacologic comfort measures Outcome: Progressing   Problem: Health Behavior/Discharge Planning: Goal: Ability to manage health-related needs will improve Outcome: Progressing   Problem: Clinical Measurements: Goal: Ability to maintain clinical measurements within normal limits will improve Outcome: Progressing Goal: Diagnostic test results will improve Outcome: Progressing Goal: Respiratory complications will improve Outcome: Progressing Goal: Cardiovascular complication will be avoided Outcome: Progressing   Problem: Activity: Goal: Risk for activity intolerance will decrease Outcome: Progressing   Problem: Nutrition: Goal: Adequate nutrition will be maintained Outcome: Progressing   Problem: Education: Goal: Ability to describe self-care measures that may prevent or decrease complications (Diabetes Survival Skills Education) will improve Outcome: Progressing Goal: Individualized Educational Video(s) Outcome: Progressing   Problem: Coping: Goal: Ability to adjust to condition or change in health will improve Outcome: Progressing   Problem: Fluid Volume: Goal: Ability to maintain a balanced intake and output will improve Outcome: Progressing   Problem: Metabolic: Goal: Ability to maintain appropriate glucose levels will improve Outcome: Progressing   Problem: Nutritional: Goal: Maintenance of adequate nutrition will improve Outcome: Progressing Goal: Progress toward achieving an optimal weight will improve Outcome: Progressing   Problem: Skin Integrity: Goal: Risk for impaired skin integrity will decrease Outcome: Progressing   Problem: Activity: Goal: Ability to return to baseline activity level will improve Outcome: Progressing   Problem: Cardiovascular: Goal: Ability to achieve and maintain  adequate cardiovascular perfusion will improve Outcome: Progressing Goal: Vascular access site(s) Level 0-1 will be maintained Outcome: Progressing   Problem: Health Behavior/Discharge Planning: Goal: Ability to safely manage health-related needs after discharge will improve Outcome: Progressing

## 2022-05-01 NOTE — Progress Notes (Signed)
I stop by the patient's room this afternoon and had an in-depth discussion with both of her children regarding Timmy's current clinical state.  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  With her comorbidities of end-stage renal disease, longstanding diabetes, she is at very high risk for amputation. Recent ultrasound demonstrated some concern for stenosis at the level of the common femoral artery with severe tibial disease distally.  There was also multilevel occlusive disease within the superficial femoral artery. An ultrasound was obtained today to assess the severity of stenosis at the common femoral lesion.  This lesion did not appear flow-limiting by velocity criteria.  My discussion with Kathryn Beck's children was focused on high risk endovascular intervention in an effort to recanalize the superficial femoral artery, and posterior tibial artery to reestablish inline flow to the foot.  This is necessary for wound healing as Kathryn Beck will likely need a first toe amputation in the future.  With her arterial disease, there is a high chance of failure as many of the lesions appear to be total occlusions, especially at the level of the ankle.  There is no target for bypass surgery.  Kathryn Beck will require superficial femoral artery stenting, balloon angioplasty of her tibial vessels.  If this is successful, I think there is a chance a toe amputation could heal.  Without inline flow, Kathryn Beck would likely require below-knee amputation versus above-knee amputation.  With her current comorbidities, I do not think she would walk with either of these.  She is booked for Monday in the Cath Lab for high risk right lower extremity intervention, with possible retrograde access.   Please make n.p.o. midnight on Sunday.  The vascular surgery team will see over the weekend to ensure she has appropriate mental status to undergo the procedure.  Broadus John MD

## 2022-05-01 NOTE — Progress Notes (Signed)
  Progress Note    05/01/2022 8:58 AM 1 Day Post-Op  Subjective: Seen in HD.  Denies pain in left groin.  Right great toe with dressing in place.   Vitals:   05/01/22 0815 05/01/22 0830  BP: (!) 127/56 (!) 117/54  Pulse: 62 63  Resp: 12 13  Temp: (!) 97.4 F (36.3 C)   SpO2: 92% 96%   Physical Exam: Lungs: Nonlabored Incisions: Left groin cath site without hematoma, palpable femoral pulse Extremities: Right foot warm, dressing left in place great toe Neurologic: Alert and oriented  CBC    Component Value Date/Time   WBC 16.4 (H) 04/30/2022 0036   RBC 4.54 04/30/2022 0036   HGB 11.2 (L) 04/30/2022 0036   HCT 35.4 (L) 04/30/2022 0036   PLT 149 (L) 04/30/2022 0036   MCV 78.0 (L) 04/30/2022 0036   MCH 24.7 (L) 04/30/2022 0036   MCHC 31.6 04/30/2022 0036   RDW 17.0 (H) 04/30/2022 0036   LYMPHSABS 2.0 04/30/2022 0036   MONOABS 0.9 04/30/2022 0036   EOSABS 0.1 04/30/2022 0036   BASOSABS 0.1 04/30/2022 0036    BMET    Component Value Date/Time   NA 136 05/01/2022 0039   NA 139 07/17/2019 1157   K 3.7 05/01/2022 0039   CL 93 (L) 05/01/2022 0039   CO2 25 05/01/2022 0039   GLUCOSE 235 (H) 05/01/2022 0039   BUN 61 (H) 05/01/2022 0039   BUN 43 (A) 07/17/2019 1157   CREATININE 7.51 (H) 05/01/2022 0039   CREATININE 3.74 (H) 08/23/2017 1407   CALCIUM 8.5 (L) 05/01/2022 0039   CALCIUM 9.4 10/15/2017 1258   GFRNONAA 6 (L) 05/01/2022 0039   GFRNONAA 12 (L) 08/23/2017 1407   GFRAA 8 (L) 07/19/2019 1210   GFRAA 14 (L) 08/23/2017 1407    INR    Component Value Date/Time   INR 1.0 03/03/2019 0800     Intake/Output Summary (Last 24 hours) at 05/01/2022 0858 Last data filed at 04/30/2022 2000 Gross per 24 hour  Intake 120 ml  Output --  Net 120 ml     Assessment/Plan:  67 y.o. female is s/p diagnostic right lower extremity arteriogram via left common femoral artery 1 Day Post-Op   Left groin catheterization site without hematoma Subjectively no change in right  foot overnight.  Continue current wound care Dr. Stanford Breed will evaluate the patient later today and discussed revascularization options based on angiogram findings   Dagoberto Ligas, PA-C [facility] [phone] 05/01/2022 8:58 AM

## 2022-05-01 NOTE — Progress Notes (Signed)
Approximately 1400-- Pt returned to room from HD. Pt connected to telemetry and VS obtained. Full assessment completed by this RN. Meds administered as ordered. Pt daughter and son at bedside. No complaints of pain at this time per pt.

## 2022-05-01 NOTE — Progress Notes (Signed)
[facility] Progress Note   67 y.o. female DM, HTN, HLD, OSA ([place] w/ CPAP), ESRD dialyzed MWF at [facility] followed by Dr. Murlean Iba. Her last outpt dialysis treatment was on Wednesday and she missed her treatment on Friday. Patient here with worsening gangrene of the right great toe which she states has been declining over the past 2 weeks. WBC 28K Tm100.6.    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 Stanford Breed following HTN - controlled DM - per primary   Subjective:   HD this AM. LE angio yest - Dr. Stanford Breed to d/w her re: revasc options today.    Objective:   BP (!) 117/54 (BP Location: Right Arm)   Pulse 63   Temp (!) 97.4 F (36.3 C) (Oral)   Resp 13   Ht 5' 4"$  (1.626 m)   Wt 79.8 kg Comment: bed  SpO2 96%   BMI 30.20 kg/m   Intake/Output Summary (Last 24 hours) at 05/01/2022 [id number] Last data filed at 04/30/2022 2000 Gross per 24 hour  Intake 120 ml  Output --  Net 120 ml    Weight change:   Physical Exam: General appearance: comfortable on HD Head: NCAT Resp: CTA b/l Cardio: RRR GI: SNDNT+BS Extremities: Rt great toe gangrene Access: lt BCF good thrill  Imaging: No results found.  Labs: BMET Recent Labs  Lab 04/25/22 0049 04/26/22 0031 04/27/22 0042 04/28/22 0053 04/29/22 0116 04/30/22 0036 05/01/22 0039  NA 142 135 136 135 140 135 136  K 4.6 3.2* 4.1 3.7 3.7 3.6 3.7  CL 102 95* 97* 95* 96* 96* 93*  CO2 23 26 25 26 27 27 25  $ GLUCOSE 142* 99 131* 255* 215* 109* 235*  BUN 64* 36* 56* 45* 71* 44* 61*  CREATININE 8.15* 5.18* 6.87* 5.00* 7.18* 5.47* 7.51*  CALCIUM 8.6* 8.6* 9.1 9.0 9.3 8.7* 8.5*  PHOS 5.9*  4.3 7.5* 5.5* 6.8* 5.0* 7.8*    CBC Recent Labs  Lab 04/27/22 0042 04/28/22 0053 04/29/22 0116 04/30/22 0036  WBC 17.1* 12.7* 13.8* 16.4*  NEUTROABS 14.9* 10.4* 10.2* 12.6*  HGB 9.9* 11.2* 11.2* 11.2*  HCT 31.0* 35.1* 35.5* 35.4*  MCV 77.3* 77.5* 77.7* 78.0*  PLT 168 161 184 149*     Medications:     amLODipine  10 mg Oral Daily   Chlorhexidine Gluconate Cloth  6 each Topical Q0600   DULoxetine  60 mg Oral Daily   ezetimibe  10 mg Oral Daily   feeding supplement (NEPRO CARB STEADY)  237 mL Oral Q24H   heparin  5,000 Units Subcutaneous Q8H   insulin aspart  0-15 Units Subcutaneous TID WC   insulin aspart  3 Units Subcutaneous TID WC   insulin glargine-yfgn  30 Units Subcutaneous Daily   metoprolol succinate  50 mg Oral Daily   metroNIDAZOLE  500 mg Oral Q12H   multivitamin  1 tablet Oral QHS   pregabalin  50 mg Oral TID   sertraline  50 mg Oral Daily    Jannifer Hick MD [facility] Pager [phone]

## 2022-05-01 NOTE — TOC Initial Note (Addendum)
Transition of Care [facility]) - Initial/Assessment Note    Patient Details  Name: Kathryn Beck MRN: [id number] Date of Birth: 19-Apr-1955  Transition of Care [facility]) CM/SW Contact:    Zenon Mayo, RN Phone Number: 05/01/2022, 3:57 PM  Clinical Narrative:                 From home with spouse, HD pateint MWF, has gangreene right toe, nonhealing, s/p angiogram, Surgery to see patient. Conts on iv abx.  NCM offered choice to daughter at bedside for [facility] services. She states they have no preference.  NCM made referral to [facility] with [facility] for [facility], HHPT, he is able to take referral.  Soc will begin 24 to 48 hrs post dc.  Daughter states they will transport her home at dc. Will need orders.  TOC following.   Expected Discharge Plan: [place] Barriers to Discharge: Continued Medical Work up   Patient Goals and CMS Choice Patient states their goals for this hospitalization and ongoing recovery are:: return home CMS Medicare.gov Compare Post Acute Care list provided to:: Patient Represenative (must comment) Choice offered to / list presented to : Adult Children      Expected Discharge Plan and Services In-house Referral: NA Discharge Planning Services: CM Consult Post Acute Care Choice: Home Health Living arrangements for the past 2 months: Single Family Home                 DME Arranged: N/A DME Agency: NA       HH Arranged: RN, PT [place] Agency: [place] Date [facility] Agency Contacted: 05/01/22 Time HH Agency Contacted: [phone] Representative spoke with at [place]: Tommi Rumps  Prior Living Arrangements/Services Living arrangements for the past 2 months: [place] with:: Spouse Patient language and need for interpreter reviewed:: Yes Do you feel safe going back to the place where you live?: Yes      Need for Family Participation in Patient Care: Yes (Comment) Care giver support system in place?: Yes (comment) Current home services: DME  (walker, and shower chair) Criminal Activity/Legal Involvement Pertinent to Current Situation/Hospitalization: No - Comment as needed  Activities of Daily Living Home Assistive Devices/Equipment: Walker (specify type) ADL Screening (condition at time of admission) Patient's cognitive ability adequate to safely complete daily activities?: Yes Is the patient deaf or have difficulty hearing?: No Does the patient have difficulty seeing, even when wearing glasses/contacts?: No Does the patient have difficulty concentrating, remembering, or making decisions?: No Patient able to express need for assistance with ADLs?: Yes Does the patient have difficulty dressing or bathing?: No Independently performs ADLs?: Yes (appropriate for developmental age) Does the patient have difficulty walking or climbing stairs?: No Weakness of Legs: None Weakness of Arms/Hands: None  Permission Sought/Granted                  Emotional Assessment Appearance:: Appears stated age       Alcohol / Substance Use: Not Applicable Psych Involvement: No (comment)  Admission diagnosis:  Diabetic wet gangrene of the foot [facility]) [E11.52] Patient Active Problem List   Diagnosis Date Noted   Requires assistance with activities of daily living (ADL) 04/26/2022   Diabetic wet gangrene of the foot ([place]) 04/25/2022   Gangrene of toe of right foot ([place]) 04/25/2022   PAD (peripheral artery disease) ([place]) 04/25/2022   Other osteoporosis without current pathological fracture 11/10/2021   Hypocalcemia 11/10/2021   Fall 11/02/2021   Left foot  pain 10/31/2021   Low back pain with left-sided sciatica 10/31/2021   Left ear impacted cerumen 09/18/2021   Chronic left shoulder pain 06/10/2021   Abnormal CXR 03/10/2021   Ear pain, bilateral 03/10/2021   Morbid obesity ([place]) 03/10/2021   Subungual hematoma of second toe of left foot 10/28/2020   Insomnia 10/28/2020   Memory loss 09/26/2020   Forgetfulness 09/26/2020    Recurrent vertigo 09/26/2020   Abnormal MRI, cervical spine 03/04/2020   Right leg weakness 02/28/2020   Knee pain, right 10/23/2019   Cough 08/09/2019   Carpal tunnel syndrome 06/13/2019   Chronic pain syndrome 06/13/2019   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[place]) 06/13/2019   Neck pain 03/23/2019   Stage 5 chronic kidney disease on chronic dialysis ([place]) 12/08/2018   Near syncope 12/05/2018   ESRD on hemodialysis ([place]) 11/22/2018   Ischemic heart disease 09/25/2018   Renal disease 08/22/2018   Not currently working due to disabled status 06/13/2018   ESRD on dialysis ([place]) 06/12/2018   HTN (hypertension) 06/12/2018   Depression 03/09/2018   Adrenal mass, left ([place]) 11/09/2017   MGUS (monoclonal gammopathy of unknown significance) 11/05/2017   Shoulder pain 11/01/2017   Chronic diastolic (congestive) heart failure ([place]) 09/20/2017   Bilateral leg weakness 09/09/2017   Adrenal adenoma 09/03/2017   Uncontrolled type 2 diabetes mellitus 08/18/2017   Unsteady gait 07/11/2017   Recurrent falls 07/11/2017   Anemia of chronic disease 03/24/2017   Personal history of colonic polyps 02/10/2017   Dysphagia 11/05/2016   Irritable bowel syndrome 02/04/2016   Intolerant of heat 10/07/2014   Cardiac murmur 01/17/2014   Back pain with left-sided radiculopathy 09/18/2013   Seasonal allergies 03/10/2013   Lipoma of back 12/22/2012   Other seasonal allergic rhinitis 08/22/2012   Sleep apnea 06/02/2011   Chronic kidney disease, stage 4 (severe) ([place]) 01/13/2011   Neuropathy 04/16/2010   Malaise and fatigue 07/24/2009   Personal history of other diseases of the nervous system and sense organs 05/08/2009   LUPUS ERYTHEMATOSUS, DISCOID 06/05/2008   Arm pain 05/30/2008   Hyperlipidemia 06/07/2007   Obesity 06/07/2007   Malignant hypertension 06/07/2007   Type 2 diabetes mellitus with hyperglycemia ([place]) 06/07/2007   Diabetes mellitus ([place]) 06/07/2007   PCP:  Fayrene Helper, MD Pharmacy:   [id number] [address]: [phone]: [address] [place]Phone: [phone]: [phone]    Social Determinants of Health (SDOH) Social History: SDOH Screenings   Food Insecurity: No Food Insecurity (04/24/2022)  Housing: Low Risk  (04/24/2022)  Transportation Needs: No Transportation Needs (04/24/2022)  Utilities: Not At Risk (04/24/2022)  Alcohol Screen: Low Risk  (10/03/2019)  Depression (PHQ2-9): High Risk (04/21/2022)  Financial Resource Strain: High Risk (05/08/2021)  Physical Activity: Inactive (11/20/2021)  Social Connections: Socially Integrated (05/08/2021)  Stress: Stress Concern Present (11/20/2021)  Tobacco Use: Low Risk  (04/26/2022)   SDOH Interventions:     Readmission Risk Interventions    05/01/2022    3:54 PM  Readmission Risk Prevention Plan  Transportation Screening Complete  HRI or [place]  Palliative Care Screening Not Applicable  Medication Review (RN Care Manager) Complete

## 2022-05-01 NOTE — Progress Notes (Signed)
Pt cartridge clotted tx paused 11:37, wasn't able to return blood.  new cartridge hung, tx resumed 11:55, flush interval increased to every 15.

## 2022-05-01 NOTE — Progress Notes (Signed)
Pharmacy Antibiotic Note  Kathryn Beck is a 67 y.o. female admitted on 04/24/2022 with  osteomyelitis of L great toe . Pt transferred from [facility] for management of osteomyelitis on 04/24/22. Patient has ESRD and is now receiving HD on MWF schedule. Receiving iHD today, 05/01/22. Pharmacy has been consulted for vancomycin dosing.  WBC 16.4 (2/8), afebrile Day 8 of antibiotic therapy  Plan: Continue vancomycin 1029m IV qHD MWF   > Goal trough level 15-20   > Monitor pre-HD level at steady state Continue Ceftriaxone 2g IV q24h per MD Continue Metronidazole 5028mIV q12h per MD Monitor daily CBC, temp, and for clinical signs of improvement  Monitor HD schedule and adjust accordingly F/u cultures and de-escalate antibiotics as able  F/u orthopedic surgery plans s/p angiogram on 04/30/22  Height: 5' 4"$  (162.6 cm) Weight: 79.8 kg (175 lb 14.8 oz) (bed) IBW/kg (Calculated) : 54.7  Temp (24hrs), Avg:97.8 F (36.6 C), Min:97.4 F (36.3 C), Max:98.1 F (36.7 C)  Recent Labs  Lab 04/26/22 0031 04/27/22 0042 04/28/22 0053 04/29/22 0116 04/30/22 0036 05/01/22 0039  WBC 22.5* 17.1* 12.7* 13.8* 16.4*  --   CREATININE 5.18* 6.87* 5.00* 7.18* 5.47* 7.51*     Estimated Creatinine Clearance: 7.5 mL/min (A) (by C-G formula based on SCr of 7.51 mg/dL (H)).    Allergies  Allergen Reactions   Ace Inhibitors Anaphylaxis and Swelling   Penicillins Itching, Swelling and Other (See Comments)    Did it involve swelling of the face/tongue/throat, SOB, or low BP? Unknown Did it involve sudden or severe rash/hives, skin peeling, or any reaction on the inside of your mouth or nose? Unknown Did you need to seek medical attention at a hospital or doctor's office? Unknown When did it last happen?      years  If all above answers are "NO", may proceed with cephalosporin use.    Statins Other (See Comments)    elevated LFT's     Albuterol Swelling    Antimicrobials this admission: Vancomycin 2/2  (started at OSH) >> Ceftriaxone 2/3 >> Metronidazole 2/3 >> Levofloxacin 2/2 x1 (at OSH)  Dose adjustments this admission: N/A  Microbiology results: None   CaLuisa HartPharmD, BCPS Clinical Pharmacist 05/01/2022 8:39 AM   Please refer to AMION for pharmacy phone number

## 2022-05-01 NOTE — Progress Notes (Signed)
Patient, Spouse and Daughter concluded they did not want an Advance Directive as the patient's husband is present and in conversation with family members. Spouse is aware of patient's wishes and will discuss with daughter and other family members before action taken.

## 2022-05-02 DIAGNOSIS — I739 Peripheral vascular disease, unspecified: Secondary | ICD-10-CM | POA: Diagnosis not present

## 2022-05-02 DIAGNOSIS — E1165 Type 2 diabetes mellitus with hyperglycemia: Secondary | ICD-10-CM | POA: Diagnosis not present

## 2022-05-02 DIAGNOSIS — Z992 Dependence on renal dialysis: Secondary | ICD-10-CM | POA: Diagnosis not present

## 2022-05-02 DIAGNOSIS — N186 End stage renal disease: Secondary | ICD-10-CM | POA: Diagnosis not present

## 2022-05-02 LAB — RENAL FUNCTION PANEL
Albumin: 2.7 g/dL — ABNORMAL LOW (ref 3.5–5.0)
Anion gap: 14 (ref 5–15)
BUN: 33 mg/dL — ABNORMAL HIGH (ref 8–23)
CO2: 28 mmol/L (ref 22–32)
Calcium: 9 mg/dL (ref 8.9–10.3)
Chloride: 94 mmol/L — ABNORMAL LOW (ref 98–111)
Creatinine, Ser: 5.07 mg/dL — ABNORMAL HIGH (ref 0.44–1.00)
GFR, Estimated: 9 mL/min — ABNORMAL LOW (ref >60)
Glucose, Bld: 150 mg/dL — ABNORMAL HIGH (ref 70–99)
Phosphorus: 7 mg/dL — ABNORMAL HIGH (ref 2.5–4.6)
Potassium: 3.9 mmol/L (ref 3.5–5.1)
Sodium: 136 mmol/L (ref 135–145)

## 2022-05-02 LAB — GLUCOSE, CAPILLARY
Glucose-Capillary: 173 mg/dL — ABNORMAL HIGH (ref 70–99)
Glucose-Capillary: 182 mg/dL — ABNORMAL HIGH (ref 70–99)
Glucose-Capillary: 212 mg/dL — ABNORMAL HIGH (ref 70–99)
Glucose-Capillary: 215 mg/dL — ABNORMAL HIGH (ref 70–99)

## 2022-05-02 MED ORDER — SUCROFERRIC OXYHYDROXIDE 500 MG PO CHEW
1000.0000 mg | CHEWABLE_TABLET | Freq: Three times a day (TID) | ORAL | Status: DC
  Administered 2022-05-02 – 2022-05-07 (×10): 1000 mg via ORAL
  Filled 2022-05-02 (×10): qty 2

## 2022-05-02 MED ORDER — ASPIRIN 81 MG PO TBEC
81.0000 mg | DELAYED_RELEASE_TABLET | Freq: Every day | ORAL | Status: DC
  Administered 2022-05-02 – 2022-05-07 (×5): 81 mg via ORAL
  Filled 2022-05-02 (×5): qty 1

## 2022-05-02 NOTE — Progress Notes (Signed)
  Progress Note   Patient: Kathryn Beck [id number] DOB: [birth year] DOA: 04/24/2022     8 DOS: the patient was seen and examined on 05/02/2022 at 10:10AM      Brief hospital course: Mrs. Renderos is a 67 y.o. F with ESRD on HD MWF, DM, HTN, OSA not on CPAP, obesity, and depression who presented with nonhealing great toe wound.        Assessment and Plan: * Gangrene of toe of right foot ([place]) - Continue vanc - Defer mgmt to Vascular surgery   PAD (peripheral artery disease) ([place]) - Continue aspirin and Zetia  ESRD on hemodialysis ([place]) - Consult nephrology for routine HD  Type 2 diabetes mellitus with hyperglycemia (HCC) A1c 9.3%  Glucose controlled - Continue Semglee - Continue sliding scale corrections   HTN (hypertension) BP normal - Continue amlodipine, metoprolol - Hold Imdur, clonidine  Depression - Continue Cymbalta and sertraline, and Lyrica   Hyperlipidemia - Continue Zetia - Hold statin due to transaminitis          Subjective: No new complaints, patient is tired.  General aortogram yesterday, vascular surgery are planning to take her to the Cath Lab on Monday     Physical Exam: BP (!) 105/43 (BP Location: Right Arm)   Pulse 62   Temp 97.6 F (36.4 C) (Oral)   Resp 20   Ht '5\' 4"'$  (1.626 m)   Wt 77.9 kg   SpO2 95%   BMI 29.48 kg/m   Obese adult female, lying in bed, no acute distress RRR, no murmurs, no peripheral edema Respiratory normal, lungs clear without rales or wheezes Attention normal, affect blunted, judgment insight appear normal  Data Reviewed: Basic metabolic panel unremarkable  Family Communication: Husband at the bedside    Disposition: Status is: Inpatient Pending definitive management of critical limb ischemia by vascular surgery        Author: Edwin Dada, MD 05/02/2022 3:19 PM  For on call review [email or web address].

## 2022-05-02 NOTE — Progress Notes (Signed)
Called to bedside for sleepiness, unsteadiness.  BP (!) 119/58 (BP Location: Right Arm)   Pulse 61   Temp 97.9 F (36.6 C) (Oral)   Resp 18   Ht 5' 4"$  (1.626 m)   Wt 77.9 kg   SpO2 95%   BMI 29.48 kg/m   Vitals normal, patient sleepy but similar to last 3 days.  States she feels fine.    Metabolic panel this morning unremarkable for renal failure.  Reviewed medication list, only sedating medicine is Lyrica, so we will reduce that and see how she does.  Also obtain VBG.

## 2022-05-02 NOTE — Progress Notes (Addendum)
Mobility Specialist Progress Note:   05/02/22 1532  Mobility  Activity  (STS x3)  Level of Assistance Moderate assist, patient does 50-74%  Assistive Device Front wheel walker  Activity Response Tolerated fair  $Mobility charge 1 Mobility   Pre- Mobility:  62 HR;105/45  BP During Mobility:63 HR;  97/52 BP  Pt in chair willing to participate in mobility. No complaints of pain. Upon standing pts knees buckled requiring modA to remain standing. Pt then on second stand able to remain up for ~ 2 minutes but fatigued and  needed to sit. Pt able to stand one more time but stated she was dizzy and needed to sit. Left in chair with call bell in reach and all needs met.   Gareth Eagle Charly Hunton Mobility Specialist Please contact via [employer] or  Rehab Office at [phone]

## 2022-05-02 NOTE — Plan of Care (Signed)
  Problem: Education: Goal: Knowledge of General Education information will improve Description: Including pain rating scale, medication(s)/side effects and non-pharmacologic comfort measures Outcome: Progressing   Problem: Health Behavior/Discharge Planning: Goal: Ability to manage health-related needs will improve Outcome: Progressing   Problem: Clinical Measurements: Goal: Ability to maintain clinical measurements within normal limits will improve Outcome: Progressing Goal: Will remain free from infection Outcome: Progressing Goal: Diagnostic test results will improve Outcome: Progressing Goal: Respiratory complications will improve Outcome: Progressing   Problem: Activity: Goal: Risk for activity intolerance will decrease Outcome: Progressing   Problem: Nutrition: Goal: Adequate nutrition will be maintained Outcome: Progressing   Problem: Coping: Goal: Level of anxiety will decrease Outcome: Progressing   Problem: Elimination: Goal: Will not experience complications related to bowel motility Outcome: Progressing Goal: Will not experience complications related to urinary retention Outcome: Progressing   Problem: Pain Managment: Goal: General experience of comfort will improve Outcome: Progressing   Problem: Safety: Goal: Ability to remain free from injury will improve Outcome: Progressing   Problem: Health Behavior/Discharge Planning: Goal: Ability to identify and utilize available resources and services will improve Outcome: Progressing Goal: Ability to manage health-related needs will improve Outcome: Progressing   Problem: Metabolic: Goal: Ability to maintain appropriate glucose levels will improve Outcome: Progressing   Problem: Nutritional: Goal: Maintenance of adequate nutrition will improve Outcome: Progressing Goal: Progress toward achieving an optimal weight will improve Outcome: Progressing   Problem: Skin Integrity: Goal: Risk for impaired  skin integrity will decrease Outcome: Progressing   Problem: Tissue Perfusion: Goal: Adequacy of tissue perfusion will improve Outcome: Progressing

## 2022-05-02 NOTE — Progress Notes (Signed)
[facility] Progress Note   67 y.o. female DM, HTN, HLD, OSA ([place] w/ CPAP), ESRD dialyzed MWF at [facility] followed by Dr. Murlean Iba. Her last outpt dialysis treatment was on Wednesday and she missed her treatment on Friday. Patient here with worsening gangrene of the right great toe which she states has been declining over the past 2 weeks. WBC 28K Tm100.6.    Assessment/ Plan:   ESRD - formerly on PD but now on iHD MWF @ [facility] HD 2/3 with 2L net UF; HD 2/9 - 1.9L UF.  Next 2/12 but need to work around angiogram.   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 Stanford Breed following and will attempt revasc 2/12 HTN - controlled DM - per primary   Subjective:   HD yest went fine Dr. Stanford Breed d/w her re: revasc -- proceeding Monday She feels fine today Husband bedside   Objective:   BP (!) 111/49 (BP Location: Right Arm)   Pulse 63   Temp 98.2 F (36.8 C) (Oral)   Resp 20   Ht 5' 4"$  (1.626 m)   Wt 77.9 kg   SpO2 94%   BMI 29.48 kg/m   Intake/Output Summary (Last 24 hours) at 05/02/2022 1408 Last data filed at 05/02/2022 0900 Gross per 24 hour  Intake 662.67 ml  Output 0 ml  Net 662.67 ml    Weight change:   Physical Exam: General appearance: comfortable on HD Head: NCAT Resp: CTA b/l Cardio: RRR GI: SNDNT+BS Extremities: Rt great toe gangrene Access: lt BCF good thrill  Imaging: VAS [place] LOWER EXTREMITY ARTERIAL DUPLEX  Result Date: 05/01/2022 LOWER EXTREMITY ARTERIAL DUPLEX STUDY Patient Name:  Kathryn Beck  Date of Exam:   05/01/2022 Medical Rec #: [id number]        Accession #:    [id number] Date of Birth: [birth year]        Patient Gender: F Patient Age:   65 years Exam Location:  [facility] Procedure:      VAS [place] LOWER EXTREMITY ARTERIAL DUPLEX Referring Phys: Kathryn Beck  --------------------------------------------------------------------------------  Indications: Ulceration, gangrene, and Follow up- evaluate right common femoral              artery plaque visualized on aortogram. High Risk Factors: Hypertension, hyperlipidemia. Other Factors: ESRD, on dialysis.  Current ABI: 0.40 RT, 0.42 LT on 04-25-2022 Comparison Study: Aortogram with lower extremity performed yesterday. Performing Technologist: Kathryn Beck  Examination Guidelines: A complete evaluation includes B-mode imaging, spectral Doppler, color Doppler, and power Doppler as needed of all accessible portions of each vessel. Bilateral testing is considered an integral part of a complete examination. Limited examinations for reoccurring indications may be performed as noted.  +----------+--------+-----+--------+--------+--------+ RIGHT     PSV cm/sRatioStenosisWaveformComments +----------+--------+-----+--------+--------+--------+ CFA Prox  134                  biphasic         +----------+--------+-----+--------+--------+--------+ CFA Mid   184                  biphasic         +----------+--------+-----+--------+--------+--------+ CFA Distal133                  biphasic         +----------+--------+-----+--------+--------+--------+  DFA       84                   biphasic         +----------+--------+-----+--------+--------+--------+ SFA Prox  98                   biphasic         +----------+--------+-----+--------+--------+--------+  Summary: See table(s) above for measurements and observations. Electronically signed by Kathryn Beck on 05/01/2022 at 5:19:00 PM.    Final    PERIPHERAL VASCULAR CATHETERIZATION  Result Date: 05/01/2022 DATE OF SERVICE: 04/30/2022  PATIENT:  Kathryn Beck  67 y.o. 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  PATIENT DISPOSITION:  PACU - hemodynamically stable.  Delay start of Pharmacological VTE agent (>24hrs) due to surgical blood loss or risk of bleeding: no  INDICATION FOR PROCEDURE: Kathryn REPINSKIis a 67y.o. female with right great toe gangrene. After careful discussion of risks, benefits, and alternatives the patient was offered angiography. The patient understood and wished to proceed.  OPERATIVE FINDINGS: Patient developed respiratory failure and required rescue to improve oxygenation and ventilation. Ultimately reversed sedation, and placed oral airway with good result.  Terminal aorta and iliac arteries: Widely patent  Right lower extremity: Common femoral artery: Focal plaque causing flow limiting stenosis Profunda femoris artery: diseased, but patent Superficial femoral artery: diffusely diseased. Multifocal stenosis - greatest 90%. Popliteal artery: diffusely diseased but patent Anterior tibial artery: diffusely diseased, occluded proximally. Reconstitutes distally, but heavily diseased. Tibioperoneal trunk: widely patent Peroneal artery: widely patent proximally, but does not opacify in mid leg. Posterior tibial artery: small, but patent proximally. Diffuse disease. Critical disease in distal 1/3rd. Pedal circulation: fills via reconstituted AT and PT  GLASS score. FP 4. IP 4. Stage III.  WIfI score. 2 / 3 / 0. Stage IV.  DESCRIPTION OF PROCEDURE: After identification of the patient in the pre-operative holding area, the patient was transferred to the operating room. The patient was positioned supine on the operating room table. Anesthesia was induced. The  groins was prepped and draped in standard fashion. A surgical pause was performed confirming correct patient, procedure, and operative location.  The left groin was anesthetized with subcutaneous injection of 1% lidocaine. Using ultrasound guidance, the left common femoral artery was accessed with micropuncture technique. Fluoroscopy was used to confirm cannulation over the femoral head. The 44F sheath was upsized to 1F.  Shortly after the case began, the patient suffered respiratory failure.  She maintained a pulse throughout the case.  Her oxygen saturation dropped.  Sedation was reversed.  Patient had improvement in oxygenation and ventilation.  A Benson wire was advanced into the distal aorta. Over the wire an omni flush catheter was advanced to the level of L2. Aortogram was performed - see above for details.  The right common iliac artery was selected with an omniflush catheter and Bentson guidewire. The wire was advanced into the common femoral artery. Over the wire the omni flush catheter was advanced into  the external iliac artery. Selective angiography was performed - see above for details.  The sheath was left in place to be removed in the recovery area.  Conscious sedation was administered with the use of IV fentanyl and midazolam under continuous physician and nurse monitoring.  Heart rate, blood pressure, and oxygen saturation were continuously monitored.  Total sedation time was 23 minutes  Upon completion of the case instrument and sharps counts were confirmed correct. The patient was transferred to the PACU in good condition. I was present for all portions of the procedure.  PLAN: Aspirin 81 mg by mouth daily.  High intensity statin therapy.  Will discuss revascularization options with her tomorrow.  Yevonne Aline. Stanford Breed, [facility] Phone Number: [phone] 04/30/2022 3:50 PM    Labs: BMET Recent Labs  Lab 04/26/22 0031 04/27/22 0042 04/28/22 0053  04/29/22 0116 04/30/22 0036 05/01/22 0039 05/02/22 0048  NA 135 136 135 140 135 136 136  K 3.2* 4.1 3.7 3.7 3.6 3.7 3.9  CL 95* 97* 95* 96* 96* 93* 94*  CO2 26 25 26 27 27 25 28  $ GLUCOSE 99 131* 255* 215* 109* 235* 150*  BUN 36* 56* 45* 71* 44* 61* 33*  CREATININE 5.18* 6.87* 5.00* 7.18* 5.47* 7.51* 5.07*  CALCIUM 8.6* 9.1 9.0 9.3 8.7* 8.5* 9.0  PHOS 4.3 7.5* 5.5* 6.8* 5.0* 7.8* 7.0*    CBC Recent Labs  Lab 04/27/22 0042 04/28/22 0053 04/29/22 0116 04/30/22 0036 05/01/22 0825  WBC 17.1* 12.7* 13.8* 16.4* 9.6  NEUTROABS 14.9* 10.4* 10.2* 12.6*  --   HGB 9.9* 11.2* 11.2* 11.2* 10.9*  HCT 31.0* 35.1* 35.5* 35.4* 34.6*  MCV 77.3* 77.5* 77.7* 78.0* 78.6*  PLT 168 161 184 149* 218     Medications:     amLODipine  10 mg Oral Daily   Chlorhexidine Gluconate Cloth  6 each Topical Q0600   DULoxetine  60 mg Oral Daily   ezetimibe  10 mg Oral Daily   feeding supplement (NEPRO CARB STEADY)  237 mL Oral Q24H   heparin  5,000 Units Subcutaneous Q8H   insulin aspart  0-15 Units Subcutaneous TID WC   insulin aspart  3 Units Subcutaneous TID WC   insulin glargine-yfgn  30 Units Subcutaneous Daily   metoprolol succinate  50 mg Oral Daily   multivitamin  1 tablet Oral QHS   pregabalin  50 mg Oral TID   sertraline  50 mg Oral Daily   sodium chloride flush  3 mL Intravenous Q12H    Jannifer Hick [facility] Pager [phone]

## 2022-05-03 DIAGNOSIS — N186 End stage renal disease: Secondary | ICD-10-CM | POA: Diagnosis not present

## 2022-05-03 DIAGNOSIS — E1165 Type 2 diabetes mellitus with hyperglycemia: Secondary | ICD-10-CM | POA: Diagnosis not present

## 2022-05-03 DIAGNOSIS — Z992 Dependence on renal dialysis: Secondary | ICD-10-CM | POA: Diagnosis not present

## 2022-05-03 DIAGNOSIS — I739 Peripheral vascular disease, unspecified: Secondary | ICD-10-CM | POA: Diagnosis not present

## 2022-05-03 LAB — RENAL FUNCTION PANEL
Albumin: 2.7 g/dL — ABNORMAL LOW (ref 3.5–5.0)
Anion gap: 15 (ref 5–15)
BUN: 52 mg/dL — ABNORMAL HIGH (ref 8–23)
CO2: 24 mmol/L (ref 22–32)
Calcium: 8.8 mg/dL — ABNORMAL LOW (ref 8.9–10.3)
Chloride: 97 mmol/L — ABNORMAL LOW (ref 98–111)
Creatinine, Ser: 7 mg/dL — ABNORMAL HIGH (ref 0.44–1.00)
GFR, Estimated: 6 mL/min — ABNORMAL LOW (ref >60)
Glucose, Bld: 180 mg/dL — ABNORMAL HIGH (ref 70–99)
Phosphorus: 7.1 mg/dL — ABNORMAL HIGH (ref 2.5–4.6)
Potassium: 3.7 mmol/L (ref 3.5–5.1)
Sodium: 136 mmol/L (ref 135–145)

## 2022-05-03 LAB — GLUCOSE, CAPILLARY
Glucose-Capillary: 152 mg/dL — ABNORMAL HIGH (ref 70–99)
Glucose-Capillary: 167 mg/dL — ABNORMAL HIGH (ref 70–99)
Glucose-Capillary: 158 mg/dL — ABNORMAL HIGH (ref 70–99)
Glucose-Capillary: 266 mg/dL — ABNORMAL HIGH (ref 70–99)

## 2022-05-03 LAB — BLOOD GAS, VENOUS
Acid-Base Excess: 5.7 mmol/L — ABNORMAL HIGH (ref 0.0–2.0)
Bicarbonate: 32.1 mmol/L — ABNORMAL HIGH (ref 20.0–28.0)
O2 Saturation: 90 %
Patient temperature: 37
pCO2, Ven: 53 mmHg (ref 44–60)
pH, Ven: 7.39 (ref 7.25–7.43)
pO2, Ven: 59 mmHg — ABNORMAL HIGH (ref 32–45)

## 2022-05-03 MED ORDER — ORAL CARE MOUTH RINSE: 15.0000 mL | OROMUCOSAL | Status: DC | PRN

## 2022-05-03 NOTE — Progress Notes (Signed)
    Subjective  -   No complaints this morning   Physical Exam:  Extremities remain unchanged.  She has right leg tissue loss       Assessment/Plan:    PAD with ulcer, right leg: The patient's husband is present at bedside.  As per Dr. Virl Cagey plan, we will proceed with angiography tomorrow.  Procedure was discussed with the patient in detail.  All questions were answered.  She will be n.p.o. after midnight.  Wells Samin Milke 05/03/2022 11:22 AM --  Vitals:   05/03/22 0040 05/03/22 0524  BP:  (!) 134/57  Pulse:  62  Resp:  14  Temp: 98.5 F (36.9 C) 97.7 F (36.5 C)  SpO2:  93%    Intake/Output Summary (Last 24 hours) at 05/03/2022 1122 Last data filed at 05/03/2022 0900 Gross per 24 hour  Intake 620 ml  Output 0 ml  Net 620 ml     Laboratory CBC    Component Value Date/Time   WBC 9.6 05/01/2022 0825   HGB 10.9 (L) 05/01/2022 0825   HCT 34.6 (L) 05/01/2022 0825   PLT 218 05/01/2022 0825    BMET    Component Value Date/Time   NA 136 05/03/2022 0049   NA 139 07/17/2019 1157   K 3.7 05/03/2022 0049   CL 97 (L) 05/03/2022 0049   CO2 24 05/03/2022 0049   GLUCOSE 180 (H) 05/03/2022 0049   BUN 52 (H) 05/03/2022 0049   BUN 43 (A) 07/17/2019 1157   CREATININE 7.00 (H) 05/03/2022 0049   CREATININE 3.74 (H) 08/23/2017 1407   CALCIUM 8.8 (L) 05/03/2022 0049   CALCIUM 9.4 10/15/2017 1258   GFRNONAA 6 (L) 05/03/2022 0049   GFRNONAA 12 (L) 08/23/2017 1407   GFRAA 8 (L) 07/19/2019 1210   GFRAA 14 (L) 08/23/2017 1407    COAG Lab Results  Component Value Date   INR 1.0 03/03/2019   INR 1.17 09/02/2017   No results found for: "PTT"  Antibiotics Anti-infectives (From admission, onward)    Start     Dose/Rate Route Frequency Ordered Stop   04/27/22 1200  vancomycin (VANCOCIN) IVPB 1000 mg/200 mL premix        1,000 mg 200 mL/hr over 60 Minutes Intravenous Every M-W-F (Hemodialysis) 04/25/22 1304     04/25/22 1800  vancomycin (VANCOREADY) IVPB 750 mg/150  mL  Status:  Discontinued        750 mg 150 mL/hr over 60 Minutes Intravenous Every T-Th-Sa (Hemodialysis) 04/25/22 0031 04/25/22 1304   04/25/22 1345  vancomycin (VANCOCIN) IVPB 1000 mg/200 mL premix        1,000 mg 200 mL/hr over 60 Minutes Intravenous  Once 04/25/22 1304 04/25/22 1838   04/25/22 0100  metroNIDAZOLE (FLAGYL) tablet 500 mg        500 mg Oral Every 12 hours 04/25/22 0004 05/01/22 1416   04/25/22 0015  cefTRIAXone (ROCEPHIN) 2 g in sodium chloride 0.9 % 100 mL IVPB        2 g 200 mL/hr over 30 Minutes Intravenous Daily at bedtime 04/25/22 0004 05/01/22 0905        V. Leia Alf, M.D., [facility] [place] Office: [phone] Pager:  [phone]

## 2022-05-03 NOTE — Plan of Care (Signed)
  Problem: Education: Goal: Knowledge of General Education information will improve Description: Including pain rating scale, medication(s)/side effects and non-pharmacologic comfort measures Outcome: Progressing   Problem: Health Behavior/Discharge Planning: Goal: Ability to manage health-related needs will improve Outcome: Progressing   Problem: Clinical Measurements: Goal: Ability to maintain clinical measurements within normal limits will improve Outcome: Progressing Goal: Will remain free from infection Outcome: Progressing Goal: Diagnostic test results will improve Outcome: Progressing Goal: Respiratory complications will improve Outcome: Progressing Goal: Cardiovascular complication will be avoided Outcome: Progressing   Problem: Activity: Goal: Risk for activity intolerance will decrease Outcome: Progressing   Problem: Nutrition: Goal: Adequate nutrition will be maintained Outcome: Progressing   Problem: Elimination: Goal: Will not experience complications related to bowel motility Outcome: Progressing Goal: Will not experience complications related to urinary retention Outcome: Progressing   Problem: Pain Managment: Goal: General experience of comfort will improve Outcome: Progressing   Problem: Safety: Goal: Ability to remain free from injury will improve Outcome: Progressing   Problem: Education: Goal: Ability to describe self-care measures that may prevent or decrease complications (Diabetes Survival Skills Education) will improve Outcome: Progressing Goal: Individualized Educational Video(s) Outcome: Progressing   Problem: Coping: Goal: Ability to adjust to condition or change in health will improve Outcome: Progressing   Problem: Fluid Volume: Goal: Ability to maintain a balanced intake and output will improve Outcome: Progressing   Problem: Nutritional: Goal: Progress toward achieving an optimal weight will improve Outcome: Progressing    Problem: Skin Integrity: Goal: Risk for impaired skin integrity will decrease Outcome: Progressing   Problem: Tissue Perfusion: Goal: Adequacy of tissue perfusion will improve Outcome: Progressing   Problem: Cardiovascular: Goal: Ability to achieve and maintain adequate cardiovascular perfusion will improve Outcome: Progressing Goal: Vascular access site(s) Level 0-1 will be maintained Outcome: Progressing   Problem: Health Behavior/Discharge Planning: Goal: Ability to safely manage health-related needs after discharge will improve Outcome: Progressing

## 2022-05-03 NOTE — Progress Notes (Signed)
  Progress Note   Patient: Kathryn Beck [id number] DOB: Dec 21, 1955 DOA: 04/24/2022     9 DOS: the patient was seen and examined on 05/03/2022        Brief hospital course: Mrs. Pruett is a 67 y.o. F with ESRD on HD MWF, DM, HTN, OSA not on CPAP, obesity, and depression who presented with nonhealing great toe wound.     2/2: Admitted 2/3: Ortho consulted, they recommended vascular consult first 2/8: Underwent aortogram, diffuse disease 2/11: Stable, pending repeat angiography with potential procedure planned for tomorrow     Assessment and Plan: * Gangrene of toe of right foot [facility]) Vascular surgery performed angiography and plan for repeat, with intervention tomorrow.  After revascularization, will need to ask Dr. Sharol Given to re-evaluate for operative treamtent of toe. - Continue vancomycin - Consult vascular surgery and orthopedics for aortogram and amputation - Plan for angioplasty on Monday     PAD (peripheral artery disease) ([place]) ABI 2/3 with severe disease, right 0.4 and Left 0.42 Angiogram 2/8 with diffuse disease -Consult vascular surgery, appreciate cares - Continue Zetia - Intolerant of statins - Continue aspirin   ESRD on hemodialysis ([place]) - Consult nephrology for routine HD  Type 2 diabetes mellitus with hyperglycemia (HCC) A1c 9.3% Glucose here well controlled now - Continue Semglee - Continue sliding scale corrections   Neuropathy - Continue Lyrica  HTN (hypertension) Blood pressure controlled - Continue amlodipine, metoprolol - Hold Imdur, clonidine  Depression - Continue Cymbalta and sertraline, and Lyrica  Chronic diastolic (congestive) heart failure (HCC) Appears euvolemic  Obesity BMI 31 Dietitian evaluated patient, malnutrition ruled out.  Hyperlipidemia - Continue Zetia - Hold statin due to transaminitis          Subjective: No new complaints.  Sleepy but overall well, feels at baseline.     Physical Exam: BP (!)  117/59 (BP Location: Right Arm)   Pulse 61   Temp 98.5 F (36.9 C) (Oral)   Resp 18   Ht '5\' 4"'$  (1.626 m)   Wt 79.5 kg   SpO2 95%   BMI 30.08 kg/m   Elderly female, lying in bed, interactive and appropriate RRR no murmurs, no LE edema Respiratory rate normal, lungs clear without rales or wheezes Overall, sleepy, but oriented, strength seems normal, interactive and appropriate    Data Reviewed: VBG normal this morning Basic metabolic panel unremarkable for chronic renal disease Glucoses mostly controlled  Family Communication: Husband at the bedside    Disposition: Status is: Inpatient The patient was admitted for gangrene of the right toe  She has been undergoing vascular evaluation, will have a procedure done tomorrow, after which we will need orthopedics planning for operative repair to help        Author: Edwin Dada, MD 05/03/2022 3:23 PM  For on call review [email or web address].

## 2022-05-03 NOTE — Progress Notes (Signed)
Mobility Specialist Progress Note:   05/03/22 1335  Mobility  Activity Ambulated with assistance in room;Transferred to/from [facility];Transferred from bed to chair  Level of Assistance Contact guard assist, steadying assist  Assistive Device Front wheel walker  Distance Ambulated (ft) 6 ft  Activity Response Tolerated well  $Mobility charge 1 Mobility   Pt received in bed asking to use BSC then get to chair. No complaints of pain or lightheadedness. Transferred to [facility], pt able to have BM. Left in chair with call bell in reach and all needs met.  Gareth Eagle Nero Sawatzky Mobility Specialist Please contact via [employer] or  Rehab Office at [phone]

## 2022-05-03 NOTE — Progress Notes (Signed)
[facility] Progress Note   67 y.o. female DM, HTN, HLD, OSA ([place] w/ CPAP), ESRD dialyzed MWF at [facility] followed by Dr. Murlean Iba. Her last outpt dialysis treatment was on Wednesday and she missed her treatment on Friday. Patient here with worsening gangrene of the right great toe which she states has been declining over the past 2 weeks. WBC 28K Tm100.6.    Assessment/ Plan:   ESRD - formerly on PD but now on iHD MWF @ [facility] HD 2/3 with 2L net UF; HD 2/9 - 1.9L UF.  Next 2/12 but need to work around angiogram -- order specifies AFTER angiogram    Renal osteodystrophy - phos up resumed velphoro Anemia - Hb stable in 10-11s, no need for ESA currently.  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 VVS plans revasc -- proceeding Monday She feels fine today Husband bedside   Objective:   BP (!) 134/57 (BP Location: Right Arm)   Pulse 62   Temp 97.7 F (36.5 C) (Oral)   Resp 14   Ht 5' 4"$  (1.626 m)   Wt 79.5 kg   SpO2 93%   BMI 30.08 kg/m   Intake/Output Summary (Last 24 hours) at 05/03/2022 1230 Last data filed at 05/03/2022 0900 Gross per 24 hour  Intake 620 ml  Output 0 ml  Net 620 ml    Weight change: -0.3 kg  Physical Exam: General appearance: comfortable Head: NCAT Resp: CTA b/l Cardio: RRR GI: SNDNT+BS Extremities: Rt great toe gangrene Access: lt BCF good thrill  Imaging: VAS [place] LOWER EXTREMITY ARTERIAL DUPLEX  Result Date: 05/01/2022 LOWER EXTREMITY ARTERIAL DUPLEX STUDY Patient Name:  Kathryn Beck  Date of Exam:   05/01/2022 Medical Rec #: [id number]        Accession #:    [id number] Date of Birth: 09-08-1955        Patient Gender: F Patient Age:   68 years Exam Location:  [facility] Procedure:      VAS [place] LOWER EXTREMITY ARTERIAL DUPLEX Referring Phys: Vonna Kotyk  ROBINS --------------------------------------------------------------------------------  Indications: Ulceration, gangrene, and Follow up- evaluate right common femoral              artery plaque visualized on aortogram. High Risk Factors: Hypertension, hyperlipidemia. Other Factors: ESRD, on dialysis.  Current ABI: 0.40 RT, 0.42 LT on 04-25-2022 Comparison Study: Aortogram with lower extremity performed yesterday. Performing Technologist: Darlin Coco RDMS, RVT  Examination Guidelines: A complete evaluation includes B-mode imaging, spectral Doppler, color Doppler, and power Doppler as needed of all accessible portions of each vessel. Bilateral testing is considered an integral part of a complete examination. Limited examinations for reoccurring indications may be performed as noted.  +----------+--------+-----+--------+--------+--------+ RIGHT     PSV cm/sRatioStenosisWaveformComments +----------+--------+-----+--------+--------+--------+ CFA Prox  134                  biphasic         +----------+--------+-----+--------+--------+--------+ CFA Mid   184                  biphasic         +----------+--------+-----+--------+--------+--------+ CFA Distal133                  biphasic         +----------+--------+-----+--------+--------+--------+  DFA       84                   biphasic         +----------+--------+-----+--------+--------+--------+ SFA Prox  98                   biphasic         +----------+--------+-----+--------+--------+--------+  Summary: See table(s) above for measurements and observations. Electronically signed by Orlie Pollen on 05/01/2022 at 5:19:00 PM.    Final     Labs: BMET Recent Labs  Lab 04/27/22 [id number] 04/28/22 [id number] 04/29/22 0116 04/30/22 0036 05/01/22 0039 05/02/22 0048 05/03/22 0049  NA 136 135 140 135 136 136 136  K 4.1 3.7 3.7 3.6 3.7 3.9 3.7  CL 97* 95* 96* 96* 93* 94* 97*  CO2 25 26 27 27 25 28 24  $ GLUCOSE 131* 255* 215* 109*  235* 150* 180*  BUN 56* 45* 71* 44* 61* 33* 52*  CREATININE 6.87* 5.00* 7.18* 5.47* 7.51* 5.07* 7.00*  CALCIUM 9.1 9.0 9.3 8.7* 8.5* 9.0 8.8*  PHOS 7.5* 5.5* 6.8* 5.0* 7.8* 7.0* 7.1*    CBC Recent Labs  Lab 04/27/22 0042 04/28/22 0053 04/29/22 0116 04/30/22 0036 05/01/22 0825  WBC 17.1* 12.7* 13.8* 16.4* 9.6  NEUTROABS 14.9* 10.4* 10.2* 12.6*  --   HGB 9.9* 11.2* 11.2* 11.2* 10.9*  HCT 31.0* 35.1* 35.5* 35.4* 34.6*  MCV 77.3* 77.5* 77.7* 78.0* 78.6*  PLT 168 161 184 149* 218     Medications:     amLODipine  10 mg Oral Daily   aspirin EC  81 mg Oral Daily   Chlorhexidine Gluconate Cloth  6 each Topical Q0600   DULoxetine  60 mg Oral Daily   ezetimibe  10 mg Oral Daily   feeding supplement (NEPRO CARB STEADY)  237 mL Oral Q24H   heparin  5,000 Units Subcutaneous Q8H   insulin aspart  0-15 Units Subcutaneous TID WC   insulin aspart  3 Units Subcutaneous TID WC   insulin glargine-yfgn  30 Units Subcutaneous Daily   metoprolol succinate  50 mg Oral Daily   multivitamin  1 tablet Oral QHS   sertraline  50 mg Oral Daily   sodium chloride flush  3 mL Intravenous Q12H   sucroferric oxyhydroxide  1,000 mg Oral TID WC    Jannifer Hick MD [facility] Pager [phone]

## 2022-05-04 ENCOUNTER — Encounter (HOSPITAL_COMMUNITY): Payer: Self-pay | Admitting: Vascular Surgery

## 2022-05-04 ENCOUNTER — Encounter (HOSPITAL_COMMUNITY)
Admission: RE | Disposition: A | Discharge status: Home Health | Payer: Self-pay | Source: Home / Self Care | Attending: Family Medicine

## 2022-05-04 DIAGNOSIS — I70235 Atherosclerosis of native arteries of right leg with ulceration of other part of foot: Secondary | ICD-10-CM

## 2022-05-04 HISTORY — PX: LOWER EXTREMITY ANGIOGRAPHY: CATH118251

## 2022-05-04 HISTORY — PX: PERIPHERAL VASCULAR BALLOON ANGIOPLASTY: CATH118281

## 2022-05-04 HISTORY — PX: PERIPHERAL VASCULAR INTERVENTION: CATH118257

## 2022-05-04 LAB — RENAL FUNCTION PANEL
Albumin: 2.7 g/dL — ABNORMAL LOW (ref 3.5–5.0)
Anion gap: 16 — ABNORMAL HIGH (ref 5–15)
BUN: 63 mg/dL — ABNORMAL HIGH (ref 8–23)
CO2: 25 mmol/L (ref 22–32)
Calcium: 8.9 mg/dL (ref 8.9–10.3)
Chloride: 97 mmol/L — ABNORMAL LOW (ref 98–111)
Creatinine, Ser: 8.91 mg/dL — ABNORMAL HIGH (ref 0.44–1.00)
GFR, Estimated: 5 mL/min — ABNORMAL LOW (ref >60)
Glucose, Bld: 258 mg/dL — ABNORMAL HIGH (ref 70–99)
Phosphorus: 7.8 mg/dL — ABNORMAL HIGH (ref 2.5–4.6)
Potassium: 3.9 mmol/L (ref 3.5–5.1)
Sodium: 138 mmol/L (ref 135–145)

## 2022-05-04 LAB — GLUCOSE, CAPILLARY: Glucose-Capillary: 200 mg/dL — ABNORMAL HIGH (ref 70–99)

## 2022-05-04 LAB — CBC
HCT: 35.1 % — ABNORMAL LOW (ref 36.0–46.0)
Hemoglobin: 10.7 g/dL — ABNORMAL LOW (ref 12.0–15.0)
MCH: 24.4 pg — ABNORMAL LOW (ref 26.0–34.0)
MCHC: 30.5 g/dL (ref 30.0–36.0)
MCV: 80 fL (ref 80.0–100.0)
Platelets: 213 10*3/uL (ref 150–400)
RBC: 4.39 MIL/uL (ref 3.87–5.11)
RDW: 18.3 % — ABNORMAL HIGH (ref 11.5–15.5)
WBC: 8.7 10*3/uL (ref 4.0–10.5)
nRBC: 1.4 % — ABNORMAL HIGH (ref 0.0–0.2)

## 2022-05-04 LAB — VANCOMYCIN, RANDOM: Vancomycin Rm: 30 ug/mL

## 2022-05-04 SURGERY — LOWER EXTREMITY ANGIOGRAPHY
Anesthesia: LOCAL | Laterality: Right

## 2022-05-04 MED ORDER — SODIUM CHLORIDE 0.9% FLUSH
3.0000 mL | Freq: Two times a day (BID) | INTRAVENOUS | Status: DC
  Administered 2022-05-04 – 2022-05-07 (×5): 3 mL via INTRAVENOUS

## 2022-05-04 MED ORDER — HEPARIN (PORCINE) IN NACL 1000-0.9 UT/500ML-% IV SOLN
INTRAVENOUS | Status: DC | PRN
  Administered 2022-05-04 (×2): 500 mL

## 2022-05-04 MED ORDER — HEPARIN SODIUM (PORCINE) 1000 UNIT/ML IJ SOLN
INTRAMUSCULAR | Status: AC
  Filled 2022-05-04: qty 10

## 2022-05-04 MED ORDER — ASPIRIN 81 MG PO CHEW
CHEWABLE_TABLET | ORAL | Status: AC
  Filled 2022-05-04: qty 1

## 2022-05-04 MED ORDER — HEPARIN (PORCINE) IN NACL 1000-0.9 UT/500ML-% IV SOLN
INTRAVENOUS | Status: AC
  Filled 2022-05-04: qty 500

## 2022-05-04 MED ORDER — HYDRALAZINE HCL 20 MG/ML IJ SOLN: 5.0000 mg | INTRAMUSCULAR | Status: DC | PRN

## 2022-05-04 MED ORDER — CLOPIDOGREL BISULFATE 75 MG PO TABS: 300.0000 mg | ORAL_TABLET | Freq: Once | ORAL | Status: AC

## 2022-05-04 MED ORDER — ONDANSETRON HCL 4 MG/2ML IJ SOLN: 4.0000 mg | Freq: Four times a day (QID) | INTRAMUSCULAR | Status: DC | PRN

## 2022-05-04 MED ORDER — HEPARIN SODIUM (PORCINE) 5000 UNIT/ML IJ SOLN
5000.0000 [IU] | Freq: Three times a day (TID) | INTRAMUSCULAR | Status: DC
  Administered 2022-05-04 – 2022-05-07 (×7): 5000 [IU] via SUBCUTANEOUS
  Filled 2022-05-04 (×7): qty 1

## 2022-05-04 MED ORDER — LIDOCAINE HCL (PF) 1 % IJ SOLN
INTRAMUSCULAR | Status: DC | PRN
  Administered 2022-05-04: 15 mL

## 2022-05-04 MED ORDER — LABETALOL HCL 5 MG/ML IV SOLN: 10.0000 mg | INTRAVENOUS | Status: DC | PRN

## 2022-05-04 MED ORDER — VANCOMYCIN HCL 750 MG/150ML IV SOLN
750.0000 mg | INTRAVENOUS | Status: DC
  Administered 2022-05-04 – 2022-05-06 (×2): 750 mg via INTRAVENOUS

## 2022-05-04 MED ORDER — LIDOCAINE HCL (PF) 1 % IJ SOLN
INTRAMUSCULAR | Status: AC
  Filled 2022-05-04: qty 30

## 2022-05-04 MED ORDER — CLOPIDOGREL BISULFATE 300 MG PO TABS
ORAL_TABLET | ORAL | Status: AC
  Filled 2022-05-04: qty 1

## 2022-05-04 MED ORDER — HEPARIN SODIUM (PORCINE) 1000 UNIT/ML IJ SOLN
INTRAMUSCULAR | Status: DC | PRN
  Administered 2022-05-04: 3000 [IU] via INTRAVENOUS
  Administered 2022-05-04: 2000 [IU] via INTRAVENOUS
  Administered 2022-05-04: 8000 [IU] via INTRAVENOUS

## 2022-05-04 MED ORDER — SODIUM CHLORIDE 0.9 % IV SOLN: 250.0000 mL | INTRAVENOUS | Status: DC | PRN

## 2022-05-04 MED ORDER — SODIUM CHLORIDE 0.9% FLUSH: 3.0000 mL | INTRAVENOUS | Status: DC | PRN

## 2022-05-04 MED ORDER — CLOPIDOGREL BISULFATE 75 MG PO TABS
75.0000 mg | ORAL_TABLET | Freq: Every day | ORAL | Status: DC
  Administered 2022-05-05 – 2022-05-07 (×3): 75 mg via ORAL
  Filled 2022-05-04 (×3): qty 1

## 2022-05-04 MED ORDER — ASPIRIN 81 MG PO CHEW
CHEWABLE_TABLET | ORAL | Status: DC | PRN
  Administered 2022-05-04: 81 mg via ORAL

## 2022-05-04 MED ORDER — VANCOMYCIN HCL 750 MG/150ML IV SOLN
INTRAVENOUS | Status: AC
  Filled 2022-05-04: qty 150

## 2022-05-04 MED ORDER — CLOPIDOGREL BISULFATE 300 MG PO TABS
ORAL_TABLET | ORAL | Status: DC | PRN
  Administered 2022-05-04: 300 mg via ORAL

## 2022-05-04 MED ORDER — ACETAMINOPHEN 325 MG PO TABS: 650.0000 mg | ORAL_TABLET | ORAL | Status: DC | PRN

## 2022-05-04 SURGICAL SUPPLY — 37 items
BALLN COYOTE OTW 1.5X40X150 (BALLOONS) ×1
BALLN COYOTE OTW 2X100X150 (BALLOONS) ×1
BALLN COYOTE OTW 2X220X150 (BALLOONS) ×1
BALLN IN.PACT DCB 5X150 (BALLOONS) ×1
BALLN IN.PACT DCB 6X60 (BALLOONS) ×1
BALLN MUSTANG 5X120X135 (BALLOONS) ×1
BALLN STERLING OTW 2X150X150 (BALLOONS) ×1
BALLN STERLING OTW 2X220X150 (BALLOONS) ×1
BALLOON COYOTE OTW 1.5X40X150 (BALLOONS) IMPLANT
BALLOON COYOTE OTW 2X100X150 (BALLOONS) IMPLANT
BALLOON COYOTE OTW 2X220X150 (BALLOONS) IMPLANT
BALLOON MUSTANG 5X120X135 (BALLOONS) IMPLANT
BALLOON STERLING OTW 2X150X150 (BALLOONS) IMPLANT
BALLOON STERLING OTW 2X220X150 (BALLOONS) IMPLANT
CATH CXI SUPP ST 4FR 135CM (CATHETERS) IMPLANT
CATH OMNI FLUSH 5F 65CM (CATHETERS) IMPLANT
CATH QUICKCROSS .018X135CM (MICROCATHETER) IMPLANT
CATH QUICKCROSS ANG SELECT (CATHETERS) IMPLANT
CATH SYNTRAX .014X135 (CATHETERS) IMPLANT
CATH SYNTRAX .014X150 (CATHETERS) IMPLANT
CATH SYNTRAX .014X65 (CATHETERS) IMPLANT
DCB IN.PACT 5X150 (BALLOONS) IMPLANT
DCB IN.PACT 6X60 (BALLOONS) IMPLANT
DEVICE CLOSURE MYNXGRIP 6/7F (Vascular Products) IMPLANT
GLIDEWIRE ADV .035X260CM (WIRE) IMPLANT
KIT ENCORE 26 ADVANTAGE (KITS) IMPLANT
KIT MICROPUNCTURE NIT STIFF (SHEATH) IMPLANT
KIT PV (KITS) ×1 IMPLANT
SHEATH CATAPULT 6FR 60 (SHEATH) IMPLANT
SHEATH PINNACLE 5F 10CM (SHEATH) IMPLANT
SHEATH PINNACLE 6F 10CM (SHEATH) IMPLANT
STENT ELUVIA 6X120X130 (Permanent Stent) IMPLANT
TRANSDUCER W/STOPCOCK (MISCELLANEOUS) ×1 IMPLANT
TRAY PV CATH (CUSTOM PROCEDURE TRAY) ×1 IMPLANT
WIRE BENTSON .035X145CM (WIRE) IMPLANT
WIRE G V18X300CM (WIRE) IMPLANT
WIRE SHEPHERD 12G .014 (WIRE) IMPLANT

## 2022-05-04 NOTE — Progress Notes (Signed)
   05/04/22 1826  Vitals  Temp 97.7 F (36.5 C)  Temp Source Oral  BP (!) 121/46  MAP (mmHg) (!) 61  BP Location Right Arm  BP Method Automatic  Patient Position (if appropriate) Lying  Pulse Rate 72  Pulse Rate Source Monitor  ECG Heart Rate 70  Resp 12  Oxygen Therapy  SpO2 98 %  O2 Device Room Air  Patient Activity (if Appropriate) In bed  Pulse Oximetry Type Continuous  Oximetry Probe Site Changed No  End [place] CO2 (EtCO2) 20  During Treatment Monitoring  Intra-Hemodialysis Comments Tx completed  Dialysis Fluid Bolus Normal Saline  Bolus Amount (mL) 300 mL   Received patient in bed to unit.  Alert and oriented.  Informed consent signed and in chart.   [place] duration:3  Patient tolerated well.  Transported back to the room  Alert, without acute distress.  Hand-off given to patient's nurse.   Access used: LLAF Access issues: no compications 1600 Total UF removed: 1600 Medication(s) given: vancomycin '750mg'$  iv    [facility]

## 2022-05-04 NOTE — Progress Notes (Signed)
PROGRESS NOTE  Kathryn Beck [id number] DOB: [birth year] DOA: 04/24/2022 PCP: Fayrene Helper, MD  Brief History   Kathryn Beck is a 67 y.o. F with ESRD on HD MWF, DM, HTN, OSA not on CPAP, obesity, and depression who presented with nonhealing great toe wound.    On 05/04/2022 the patient underwent   1.  Ultrasound-guided micropuncture access of the left common femoral artery 2.  Second-order cannulation, right lower extremity angiogram. 3.  Drug-coated balloon angioplasty of the superficial femoral artery 5 x 100, 5 x 60 mm 4.  Drug-eluting stenting of the superficial femoral artery 6 x 135m 5.  Tibial artery angioplasty-posterior tibial artery 2 x 220 mm  With Dr. RVirl Cageyfor critical limb ischemia with tissue loss of the first great toe.   She has had successful resolutio of multiple, tandem, flow-limiting stenosis throughout the SFA and above-knee popliteal artery. 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 The posterior tibial artery was balloon angioplastied to improve distal flow, but it remains unlikely that it will be sufficient to heal a toe amputation.  Consultants  Nephrology Vascular Surgery  Procedures  Ateriorgraphy of the lower extremity with angioplasty and placement of DES in the superficial femoral artery and angioplasty of the posterior tibial artery.  Antibiotics   Anti-infectives (From admission, onward)    Start     Dose/Rate Route Frequency Ordered Stop   05/04/22 1620  vancomycin (VANCOREADY) 750 MG/150ML IVPB       Note to Pharmacy: WJillyn LedgerL: cabinet override      05/04/22 1620 05/05/22 0429   05/04/22 1200  vancomycin (VANCOREADY) IVPB 750 mg/150 mL        750 mg 150 mL/hr over 60 Minutes Intravenous Every M-W-F (Hemodialysis) 05/04/22 0736     04/27/22 1200  vancomycin (VANCOCIN) IVPB 1000 mg/200 mL premix  Status:  Discontinued        1,000 mg 200 mL/hr over 60 Minutes Intravenous Every M-W-F (Hemodialysis)  04/25/22 1304 05/04/22 0736   04/25/22 1800  vancomycin (VANCOREADY) IVPB 750 mg/150 mL  Status:  Discontinued        750 mg 150 mL/hr over 60 Minutes Intravenous Every T-Th-Sa (Hemodialysis) 04/25/22 0031 04/25/22 1304   04/25/22 1345  vancomycin (VANCOCIN) IVPB 1000 mg/200 mL premix        1,000 mg 200 mL/hr over 60 Minutes Intravenous  Once 04/25/22 1304 04/25/22 1838   04/25/22 0100  metroNIDAZOLE (FLAGYL) tablet 500 mg        500 mg Oral Every 12 hours 04/25/22 0004 05/01/22 1416   04/25/22 0015  cefTRIAXone (ROCEPHIN) 2 g in sodium chloride 0.9 % 100 mL IVPB        2 g 200 mL/hr over 30 Minutes Intravenous Daily at bedtime 04/25/22 0004 05/01/22 0905        Interval History/Subjective  The patient is seen in dialysis. She states that she is doing well. No new complaints.  Objective   Vitals:  Vitals:   05/04/22 1730 05/04/22 1800  BP: (!) 79/49 (!) 90/48  Pulse: 73 72  Resp: 12 12  Temp:    SpO2:      Exam:  Constitutional:  Appears calm and comfortable Respiratory:  CTA bilaterally, no w/r/r.  Respiratory effort normal. No retractions or accessory muscle use Cardiovascular:  RRR, no m/r/g No LE extremity edema   Normal pedal pulses Abdomen:  Abdomen appears normal; no tenderness or masses No hernias No HSM Musculoskeletal:  Digits/nails BUE: no clubbing, cyanosis, petechiae, infection exam of joints, bones, muscles of at least one of following: head/neck, RUE, LUE, RLE, LLE   strength and tone normal, no atrophy, no abnormal movements No tenderness, masses Normal ROM, no contractures  gait and station Skin:  No rashes, lesions, ulcers palpation of skin: no induration or nodules Neurologic:  CN 2-12 intact Sensation all 4 extremities intact Psychiatric:  Mental status Mood, affect appropriate Orientation to person, place, time  judgment and insight appear intact     I have personally reviewed the following:   Today's Data   Vitals:    05/04/22 1730 05/04/22 1800  BP: (!) 79/49 (!) 90/48  Pulse: 73 72  Resp: 12 12  Temp:    SpO2:       Lab Data  CBC    Component Value Date/Time   WBC 8.7 05/04/2022 0115   RBC 4.39 05/04/2022 0115   HGB 10.7 (L) 05/04/2022 0115   HCT 35.1 (L) 05/04/2022 0115   PLT 213 05/04/2022 0115   MCV 80.0 05/04/2022 0115   MCH 24.4 (L) 05/04/2022 0115   MCHC 30.5 05/04/2022 0115   RDW 18.3 (H) 05/04/2022 0115   LYMPHSABS 2.0 04/30/2022 0036   MONOABS 0.9 04/30/2022 0036   EOSABS 0.1 04/30/2022 0036   BASOSABS 0.1 04/30/2022 0036      Latest Ref Rng & Units 05/04/2022    1:15 AM 05/03/2022   12:49 AM 05/02/2022   12:48 AM  BMP  Glucose 70 - 99 mg/dL 258  180  150   BUN 8 - 23 mg/dL 63  52  33   Creatinine 0.44 - 1.00 mg/dL 8.91  7.00  5.07   Sodium 135 - 145 mmol/L 138  136  136   Potassium 3.5 - 5.1 mmol/L 3.9  3.7  3.9   Chloride 98 - 111 mmol/L 97  97  94   CO2 22 - 32 mmol/L 25  24  28   $ Calcium 8.9 - 10.3 mg/dL 8.9  8.8  9.0      Micro Data   Results for orders placed or performed during the hospital encounter of 04/14/22  Resp panel by RT-PCR (RSV, Flu A&B, Covid) Anterior Nasal Swab     Status: None   Collection Time: 04/14/22  9:33 PM   Specimen: Anterior Nasal Swab  Result Value Ref Range Status   SARS Coronavirus 2 by RT PCR NEGATIVE NEGATIVE Final    Comment: (NOTE) SARS-CoV-2 target nucleic acids are NOT DETECTED.  The SARS-CoV-2 RNA is generally detectable in upper respiratory specimens during the acute phase of infection. The lowest concentration of SARS-CoV-2 viral copies this assay can detect is 138 copies/mL. A negative result does not preclude SARS-Cov-2 infection and should not be used as the sole basis for treatment or other patient management decisions. A negative result may occur with  improper specimen collection/handling, submission of specimen other than nasopharyngeal swab, presence of viral mutation(s) within the areas targeted by this  assay, and inadequate number of viral copies(<138 copies/mL). A negative result must be combined with clinical observations, patient history, and epidemiological information. The expected result is Negative.  Fact Sheet for Patients:  EntrepreneurPulse.com.au  Fact Sheet for Healthcare Providers:  IncredibleEmployment.be  This test is no t yet approved or cleared by the [place] FDA and  has been authorized for detection and/or diagnosis of SARS-CoV-2 by FDA under an Emergency Use Authorization (EUA). This EUA will remain  in effect (meaning  this test can be used) for the duration of the COVID-19 declaration under Section 564(b)(1) of the Act, 21 U.S.C.section 360bbb-3(b)(1), unless the authorization is terminated  or revoked sooner.       Influenza A by PCR NEGATIVE NEGATIVE Final   Influenza B by PCR NEGATIVE NEGATIVE Final    Comment: (NOTE) The Xpert Xpress SARS-CoV-2/FLU/RSV plus assay is intended as an aid in the diagnosis of influenza from Nasopharyngeal swab specimens and should not be used as a sole basis for treatment. Nasal washings and aspirates are unacceptable for Xpert Xpress SARS-CoV-2/FLU/RSV testing.  Fact Sheet for Patients: EntrepreneurPulse.com.au  Fact Sheet for Healthcare Providers: IncredibleEmployment.be  This test is not yet approved or cleared by the [place] FDA and has been authorized for detection and/or diagnosis of SARS-CoV-2 by FDA under an Emergency Use Authorization (EUA). This EUA will remain in effect (meaning this test can be used) for the duration of the COVID-19 declaration under Section 564(b)(1) of the Act, 21 U.S.C. section 360bbb-3(b)(1), unless the authorization is terminated or revoked.     Resp Syncytial Virus by PCR NEGATIVE NEGATIVE Final    Comment: (NOTE) Fact Sheet for Patients: EntrepreneurPulse.com.au  Fact Sheet for  Healthcare Providers: IncredibleEmployment.be  This test is not yet approved or cleared by the [place] FDA and has been authorized for detection and/or diagnosis of SARS-CoV-2 by FDA under an Emergency Use Authorization (EUA). This EUA will remain in effect (meaning this test can be used) for the duration of the COVID-19 declaration under Section 564(b)(1) of the Act, 21 U.S.C. section 360bbb-3(b)(1), unless the authorization is terminated or revoked.  Performed at [facility], [address], [place], [place] [address]    *Note: Due to a large number of results and/or encounters for the requested time period, some results have not been displayed. A complete set of results can be found in Results Review.   Scheduled Meds:  amLODipine  10 mg Oral Daily   aspirin EC  81 mg Oral Daily   Chlorhexidine Gluconate Cloth  6 each Topical Q0600   [START ON 05/05/2022] clopidogrel  75 mg Oral Q breakfast   DULoxetine  60 mg Oral Daily   ezetimibe  10 mg Oral Daily   feeding supplement (NEPRO CARB STEADY)  237 mL Oral Q24H   heparin  5,000 Units Subcutaneous Q8H   insulin aspart  0-15 Units Subcutaneous TID WC   insulin aspart  3 Units Subcutaneous TID WC   insulin glargine-yfgn  30 Units Subcutaneous Daily   metoprolol succinate  50 mg Oral Daily   multivitamin  1 tablet Oral QHS   sertraline  50 mg Oral Daily   sodium chloride flush  3 mL Intravenous Q12H   sodium chloride flush  3 mL Intravenous Q12H   sucroferric oxyhydroxide  1,000 mg Oral TID WC   Continuous Infusions:  sodium chloride     sodium chloride     vancomycin     vancomycin 750 mg (05/04/22 1716)    Principal Problem:   Gangrene of toe of right foot ([place]) Active Problems:   PAD (peripheral artery disease) (HCC)   ESRD on hemodialysis ([place])   Type 2 diabetes mellitus with hyperglycemia (HCC)   Hyperlipidemia   Obesity   Chronic diastolic (congestive) heart failure (HCC)   Depression   HTN  (hypertension)   Neuropathy   LOS: 10 days    A & P  ESRD on hemodialysis [facility]) - Nephrology has been consulted. _ Pt  on T, Th, Sa HD schedule.  Type 2 diabetes mellitus with hyperglycemia (HCC) A1c 9.3% Glucose here well controlled now - Continue Semglee - Continue sliding scale corrections.  PAD (peripheral artery disease) (HCC) ABI 2/3 with severe disease, right 0.4 and Left 0.42 Angiogram 2/8 with diffuse disease -Appreciate vascular surgery's assistance - Continue Zetia - Intolerant of statins - Start aspirin   Gangrene of toe of right foot (HCC) - Continue vancomycin - Consult vascular surgery and orthopedics for aortogram and amputation - Pt underwent angiography with angioplasty today for critical limb ischemia 1.  Ultrasound-guided micropuncture access of the left common femoral artery 2.  Second-order cannulation, right lower extremity angiogram. 3.  Drug-coated balloon angioplasty of the superficial femoral artery 5 x 100, 5 x 60 mm 4.  Drug-eluting stenting of the superficial femoral artery 6 x 159m 5.  Tibial artery angioplasty-posterior tibial artery 2 x 220 mm  Hyperlipidemia - Continue Zetia - Hold statin due to transaminitis  Neuropathy - Continue Lyrica  HTN (hypertension) Blood pressure controlled - Continue amlodipine, metoprolol - Hold Imdur, clonidine  Depression - Continue Cymbalta and sertraline, and Lyrica  Chronic diastolic (congestive) heart failure (HCC) Appears euvolemic  Obesity BMI 31 Dietitian evaluated patient, malnutrition ruled out.  Severe protein-calorie malnutrition ([place]  Noted. Nutrition is consulted.    DVT prophylaxis: Heparin Code Status: Full Code Family Communication: None available Disposition Plan: TBD    Mikaiya Tramble, DO [facility] Direct contact: see [email or web address]  7PM-7AM contact night coverage as above 05/04/2022, 6:07 PM  LOS: 10 days

## 2022-05-04 NOTE — Progress Notes (Signed)
Pharmacy Antibiotic Note  Kathryn Beck is a 67 y.o. female admitted on 04/24/2022 with  osteomyelitis of L great toe . Pt transferred from [facility] for management of osteomyelitis on 04/24/22. Patient has ESRD and is now receiving HD on MWF schedule. Pharmacy has been consulted for vancomycin dosing.   Random Vanc level this  morning is 30 mcg/ml. Pre-HD vanc goal is 15-25 mcg/ml. Planning HD today on schedule, after angiography.   Last Vanc dose of 1gm IV given on 2/9 after HD.   Plan: Decrease Vancomycin from 1gm to 750 mg IV MWF after HD. Pre-HD vanc level goal 15-25 mcg/ml Also on Ceftriaxone 2gm IV q24h and Metronidazole 500 mg IV q12h. Monitor HD schedule for timing of Vanc doses. Follow up angiogram and Orthopedic surgery plans.  Height: 5' 4"$  (162.6 cm) Weight: 81.1 kg (178 lb 12.7 oz) IBW/kg (Calculated) : 54.7  Temp (24hrs), Avg:97.9 F (36.6 C), Min:97.4 F (36.3 C), Max:98.5 F (36.9 C)  Recent Labs  Lab 04/28/22 0053 04/29/22 0116 04/30/22 0036 05/01/22 0039 05/01/22 0825 05/02/22 0048 05/03/22 0049 05/04/22 0115  WBC 12.7* 13.8* 16.4*  --  9.6  --   --  8.7  CREATININE 5.00* 7.18* 5.47* 7.51*  --  5.07* 7.00* 8.91*  VANCORANDOM  --   --   --   --   --   --   --  30    Estimated Creatinine Clearance: 6.4 mL/min (A) (by C-G formula based on SCr of 8.91 mg/dL (H)).    Allergies  Allergen Reactions   Ace Inhibitors Anaphylaxis and Swelling   Penicillins Itching, Swelling and Other (See Comments)    Did it involve swelling of the face/tongue/throat, SOB, or low BP? Unknown Did it involve sudden or severe rash/hives, skin peeling, or any reaction on the inside of your mouth or nose? Unknown Did you need to seek medical attention at a hospital or doctor's office? Unknown When did it last happen?      years  If all above answers are "NO", may proceed with cephalosporin use.    Statins Other (See Comments)    elevated LFT's     Albuterol Swelling     Antimicrobials this admission: Vancomycin 2/2 at [facility] >>  Levofloxacin x 1 on 2/2 at [facility] Ceftriaxone 2/3 >> 2/8 Metronidazole 2/3 >> 2/9  Dose adjustments this admission: 2/12: pre-HD/random Vanc level = 30 mcg/ml  Microbiology results: No cultures to date  Thank you for allowing pharmacy to be a part of this patient's care.  Arty Baumgartner, [place] 05/04/2022 8:46 AM

## 2022-05-04 NOTE — Progress Notes (Signed)
[facility] Progress Note   67 y.o. female DM, HTN, HLD, OSA ([place] w/ CPAP), ESRD dialyzed MWF at [facility] followed by Dr. Murlean Iba. Her last outpt dialysis treatment was on Wednesday and she missed her treatment on Friday. Patient here with worsening gangrene of the right great toe which she states has been declining over the past 2 weeks. WBC 28K Tm100.6.    Assessment/ Plan:   ESRD - formerly on PD but now on iHD MWF @ [place] HD 2/3 with 2L net UF; HD 2/9 - 1.9L UF.  Plan for dialysis after angiogram Renal osteodystrophy - phos up resumed velphoro Anemia - Hb stable in 10-11s, no need for ESA currently.  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  Plan for angiogram today then dialysis later   Objective:   BP (!) 138/58 (BP Location: Right Arm)   Pulse 68   Temp (!) 97.4 F (36.3 C) (Oral)   Resp 18   Ht 5' 4"$  (1.626 m)   Wt 81.1 kg   SpO2 97%   BMI 30.69 kg/m   Intake/Output Summary (Last 24 hours) at 05/04/2022 1002 Last data filed at 05/04/2022 0500 Gross per 24 hour  Intake 540 ml  Output 0 ml  Net 540 ml   Weight change: 1.6 kg  Physical Exam: General appearance: Lying in bed, no distress Head: NCAT Resp: Bilateral chest rise with no increased work of breathing Cardio: Normal rate, no edema GI: SNDNT+BS Extremities: Rt great toe gangrene Access: BCF good thrill  Imaging: No results found.  Labs: BMET Recent Labs  Lab 04/28/22 0053 04/29/22 0116 04/30/22 0036 05/01/22 0039 05/02/22 0048 05/03/22 0049 05/04/22 0115  NA 135 140 135 136 136 136 138  K 3.7 3.7 3.6 3.7 3.9 3.7 3.9  CL 95* 96* 96* 93* 94* 97* 97*  CO2 26 27 27 25 28 24 25  $ GLUCOSE 255* 215* 109* 235* 150* 180* 258*  BUN 45* 71* 44* 61* 33* 52* 63*  CREATININE 5.00* 7.18* 5.47* 7.51*  5.07* 7.00* 8.91*  CALCIUM 9.0 9.3 8.7* 8.5* 9.0 8.8* 8.9  PHOS 5.5* 6.8* 5.0* 7.8* 7.0* 7.1* 7.8*   CBC Recent Labs  Lab 04/28/22 0053 04/29/22 0116 04/30/22 0036 05/01/22 0825 05/04/22 0115  WBC 12.7* 13.8* 16.4* 9.6 8.7  NEUTROABS 10.4* 10.2* 12.6*  --   --   HGB 11.2* 11.2* 11.2* 10.9* 10.7*  HCT 35.1* 35.5* 35.4* 34.6* 35.1*  MCV 77.5* 77.7* 78.0* 78.6* 80.0  PLT 161 184 149* 218 213    Medications:     [MAR Hold] amLODipine  10 mg Oral Daily   [MAR Hold] aspirin EC  81 mg Oral Daily   [MAR Hold] Chlorhexidine Gluconate Cloth  6 each Topical Q0600   [MAR Hold] DULoxetine  60 mg Oral Daily   [MAR Hold] ezetimibe  10 mg Oral Daily   [MAR Hold] feeding supplement (NEPRO CARB STEADY)  237 mL Oral Q24H   [MAR Hold] heparin  5,000 Units Subcutaneous Q8H   [MAR Hold] insulin aspart  0-15 Units Subcutaneous TID WC   [MAR Hold] insulin aspart  3 Units Subcutaneous TID WC   [MAR Hold] insulin glargine-yfgn  30 Units Subcutaneous Daily   [MAR Hold] metoprolol succinate  50 mg Oral Daily   [  MAR Hold] multivitamin  1 tablet Oral QHS   [MAR Hold] sertraline  50 mg Oral Daily   [MAR Hold] sodium chloride flush  3 mL Intravenous Q12H   [MAR Hold] sucroferric oxyhydroxide  1,000 mg Oral TID WC

## 2022-05-04 NOTE — Progress Notes (Signed)
Pt returned to room from vascular procedure. Pt A+Ox4, site to L femoral artery is CDI, no signs of hematoma. Pt denies pain at this time. Will CTM

## 2022-05-04 NOTE — Progress Notes (Signed)
Subjective  -   No complaints this morning   Physical Exam:  Extremities remain unchanged.  She has right leg tissue loss. Alert and oriented Somnolent. Bilateral femoral arteries palpable   Assessment/Plan:    PAD with ulcer, right leg.   I  had an in-depth discussion with both of her children regarding Allie's current clinical state last week.   From a vascular surgery perspective, Betti has critical limb ischemia in the right leg with tissue loss.  With her comorbidities of end-stage renal disease, longstanding diabetes, she is at very high risk for amputation. Recent ultrasound demonstrated some concern for stenosis at the level of the common femoral artery with severe tibial disease distally.  There was also multilevel occlusive disease within the superficial femoral artery. An ultrasound was obtained today to assess the severity of stenosis at the common femoral lesion.  This lesion did not appear flow-limiting by velocity criteria.   My discussion with Kobi's children was focused on high risk endovascular intervention in an effort to recanalize the superficial femoral artery, and posterior tibial artery to reestablish inline flow to the foot.  This is necessary for wound healing as Tiarah will likely need a first toe amputation in the future.  With her arterial disease, there is a high chance of failure as many of the lesions appear to be total occlusions, especially at the level of the ankle.  There is no target for bypass surgery.   [place] will require superficial femoral artery stenting, balloon angioplasty of her tibial vessels.  If this is successful, I think there is a chance a toe amputation could heal.  Without inline flow, Betti would likely require below-knee amputation versus above-knee amputation.  With her current comorbidities, I do not think she would walk with either of these.   Plan for angiogram today.    Broadus John 05/04/2022 1:11 PM --  Vitals:    05/04/22 1301 05/04/22 1309  BP:  (!) 143/67  Pulse: (!) 0 70  Resp:  18  Temp:  97.6 F (36.4 C)  SpO2:  98%    Intake/Output Summary (Last 24 hours) at 05/04/2022 1311 Last data filed at 05/04/2022 0500 Gross per 24 hour  Intake 480 ml  Output --  Net 480 ml      Laboratory CBC    Component Value Date/Time   WBC 8.7 05/04/2022 0115   HGB 10.7 (L) 05/04/2022 0115   HCT 35.1 (L) 05/04/2022 0115   PLT 213 05/04/2022 0115    BMET    Component Value Date/Time   NA 138 05/04/2022 0115   NA 139 07/17/2019 1157   K 3.9 05/04/2022 0115   CL 97 (L) 05/04/2022 0115   CO2 25 05/04/2022 0115   GLUCOSE 258 (H) 05/04/2022 0115   BUN 63 (H) 05/04/2022 0115   BUN 43 (A) 07/17/2019 1157   CREATININE 8.91 (H) 05/04/2022 0115   CREATININE 3.74 (H) 08/23/2017 1407   CALCIUM 8.9 05/04/2022 0115   CALCIUM 9.4 10/15/2017 1258   GFRNONAA 5 (L) 05/04/2022 0115   GFRNONAA 12 (L) 08/23/2017 1407   GFRAA 8 (L) 07/19/2019 1210   GFRAA 14 (L) 08/23/2017 1407    COAG Lab Results  Component Value Date   INR 1.0 03/03/2019   INR 1.17 09/02/2017   No results found for: "PTT"  Antibiotics Anti-infectives (From admission, onward)    Start     Dose/Rate Route Frequency Ordered Stop   05/04/22 1200  [MAR Hold]  vancomycin (VANCOREADY) IVPB 750 mg/150 mL        (MAR Hold since Mon 05/04/2022 at 0950.Hold Reason: Transfer to a Procedural area)   750 mg 150 mL/hr over 60 Minutes Intravenous Every M-W-F (Hemodialysis) 05/04/22 0736     04/27/22 1200  vancomycin (VANCOCIN) IVPB 1000 mg/200 mL premix  Status:  Discontinued        1,000 mg 200 mL/hr over 60 Minutes Intravenous Every M-W-F (Hemodialysis) 04/25/22 1304 05/04/22 0736   04/25/22 1800  vancomycin (VANCOREADY) IVPB 750 mg/150 mL  Status:  Discontinued        750 mg 150 mL/hr over 60 Minutes Intravenous Every T-Th-Sa (Hemodialysis) 04/25/22 0031 04/25/22 1304   04/25/22 1345  vancomycin (VANCOCIN) IVPB 1000 mg/200 mL premix         1,000 mg 200 mL/hr over 60 Minutes Intravenous  Once 04/25/22 1304 04/25/22 1838   04/25/22 0100  metroNIDAZOLE (FLAGYL) tablet 500 mg        500 mg Oral Every 12 hours 04/25/22 0004 05/01/22 1416   04/25/22 0015  cefTRIAXone (ROCEPHIN) 2 g in sodium chloride 0.9 % 100 mL IVPB        2 g 200 mL/hr over 30 Minutes Intravenous Daily at bedtime 04/25/22 0004 05/01/22 0905

## 2022-05-04 NOTE — Op Note (Addendum)
Patient name: Kathryn Beck MRN: [id number] DOB: 05/05/1955 Sex: female  05/04/2022 Pre-operative Diagnosis: Right lower extremity critical limb ischemia with tissue loss of the first toe Post-operative diagnosis:  Same Surgeon:  Broadus John, MD Procedure Performed: 1.  Ultrasound-guided micropuncture access of the left common femoral artery 2.  Second-order cannulation, right lower extremity angiogram. 3.  Drug-coated balloon angioplasty of the superficial femoral artery 5 x 100, 5 x 60 mm 4.  Drug-eluting stenting of the superficial femoral artery 6 x 182m 5.  Tibial artery angioplasty-posterior tibial artery 2 x 220 mm 6.  Device assisted closure-Mynx 7.  Contrast volume 673m8.  Local anesthesia only.   Indications: Patient is a [age]ear old female with history of right lower extremity critical limb ischemia with tissue loss of the great toe. With her comorbidities of end-stage renal disease, longstanding diabetes, she is at very high risk for amputation.  Recent ultrasound demonstrated some concern for stenosis at the level of the common femoral artery with severe tibial disease distally.  There was also multilevel occlusive disease within the superficial femoral artery. An ultrasound was obtained today to assess the severity of stenosis at the common femoral lesion.  This lesion did not appear flow-limiting by velocity criteria.   My discussion with Eri's children was focused on high risk endovascular intervention in an effort to recanalize the superficial femoral artery, and posterior tibial artery to reestablish inline flow to the foot.  This is necessary for wound healing as BeAngeniill likely need a first toe amputation in the future.  With her arterial disease, there is a high chance of failure as many of the lesions appear to be total occlusions, especially at the level of the ankle.  There is no target for bypass surgery.   BeCarahill require superficial femoral artery  stenting, balloon angioplasty of her tibial vessels.  If this is successful, I think there is a chance a toe amputation could heal.  Without inline flow, Betti would likely require below-knee amputation versus above-knee amputation.  With her current comorbidities, I do not think she would walk with either of these.   Plan for angiogram today.   Findings:  No inflow disease is appreciated on last angiogram. Right lower extremity, multiple, tandem, flow-limiting stenosis throughout the superficial femoral artery, P1 segment of the popliteal artery. Popliteal artery patent.  Large peroneal artery which occludes in the mid tibia.  Anterior tibial artery is initially patent, but is atretic, occluding at the mid tibia.  The posterior tibial artery has numerous, tandem occlusions, but continues into the level of the foot.  No flow in the forefoot.   Procedure:  The patient was identified in the holding area and taken to room 8.  The patient was then placed supine on the table and prepped and draped in the usual sterile fashion.  A time out was called.  Ultrasound was used to evaluate the left common femoral artery.  It was patent .  A digital ultrasound image was acquired.  A micropuncture needle was used to access the left common femoral artery under ultrasound guidance.  An 018 wire was advanced without resistance and a micropuncture sheath was placed.  The 018 wire was removed and a benson wire was placed.  The micropuncture sheath was exchanged for a 5 french sheath.  An omniflush catheter was advanced over the wire to the level of L-1.  Next, using the omniflush catheter and a benson wire, the aortic bifurcation was  crossed and the catheter was placed into the right common femoral artery.  Runoff was obtained.  See results above.  I elected to intervene on the multilevel occlusive disease.  The patient was heparinized and a 6 [place] by 60 cm sheath was brought to the field and parked in the proximal  superficial femoral artery from this location, a series of wires and catheters were used to cross the lesions in the SFA.  Initially I believe these lesions using a 5 x 100 mm drug-coated balloon.  This demonstrated significant improvement, however there was residual stenosis, therefore I elected to stent the area using a drug-eluting stent sized 6 mm x 120 mm.  This was postdilated using a 5 mm balloon.  Next, I used a drug-coated balloon-5 x 60 mm in the P1 segment of the popliteal artery for greater than 70% stenosis.  Follow-up angiography demonstrated the stenosis.  Next, attention turned to runoff.  The 6 x 60 cm sheath was hubbed, and using a series of wires and catheters I was able to cannulate the posterior tibial artery and run a wire to the level of the ankle.  There were multiple, flow-limiting stenoses versus occlusions that were crossed.  A 2 x 2 20 mm balloon was brought to the field and expanded along the entirety of the posterior tibial artery.  I difficulty distally as there were several small occlusions.  I was unable to cross the most distal lesion with a balloon, however was able to cross with an 014 wire.  Follow-up angiography demonstrated significant improvement, however there was only flow to the level of the midfoot.  I elected to attempt balloon angioplasty of the peroneal and anterior tibial arteries.  An 014 wire was passed down both of the vessels, however there was no outflow appreciated.  I elected not to pursue further intervention. Arteriotomy was managed with a minx device.  Impression: Successful resolution of multiple, tandem, flow-limiting stenosis throughout the SFA and above-knee popliteal artery.  Patient with severe tibial disease with very little outflow in the foot.  The posterior tibial artery was balloon angioplastied in an effort to improve distal flow, which it did, however I do not think this will provide enough perfusion to heal a toe amputation.  Pt ASA,  Plavix, unable to tolerate statin.    Cassandria Santee, MD [facility] Office: [phone]

## 2022-05-04 NOTE — Progress Notes (Signed)
HD tx time adjusted per MD orders

## 2022-05-05 ENCOUNTER — Encounter (HOSPITAL_COMMUNITY): Payer: Self-pay | Admitting: Vascular Surgery

## 2022-05-05 ENCOUNTER — Other Ambulatory Visit: Payer: Self-pay | Admitting: Family Medicine

## 2022-05-05 DIAGNOSIS — E1152 Type 2 diabetes mellitus with diabetic peripheral angiopathy with gangrene: Secondary | ICD-10-CM | POA: Diagnosis not present

## 2022-05-05 LAB — RENAL FUNCTION PANEL
Albumin: 2.7 g/dL — ABNORMAL LOW (ref 3.5–5.0)
Anion gap: 17 — ABNORMAL HIGH (ref 5–15)
BUN: 35 mg/dL — ABNORMAL HIGH (ref 8–23)
CO2: 24 mmol/L (ref 22–32)
Calcium: 8.8 mg/dL — ABNORMAL LOW (ref 8.9–10.3)
Chloride: 95 mmol/L — ABNORMAL LOW (ref 98–111)
Creatinine, Ser: 6.3 mg/dL — ABNORMAL HIGH (ref 0.44–1.00)
GFR, Estimated: 7 mL/min — ABNORMAL LOW (ref >60)
Glucose, Bld: 200 mg/dL — ABNORMAL HIGH (ref 70–99)
Phosphorus: 6.5 mg/dL — ABNORMAL HIGH (ref 2.5–4.6)
Potassium: 3.7 mmol/L (ref 3.5–5.1)
Sodium: 136 mmol/L (ref 135–145)

## 2022-05-05 LAB — POCT ACTIVATED CLOTTING TIME: Activated Clotting Time: 320 seconds

## 2022-05-05 LAB — GLUCOSE, CAPILLARY
Glucose-Capillary: 150 mg/dL — ABNORMAL HIGH (ref 70–99)
Glucose-Capillary: 157 mg/dL — ABNORMAL HIGH (ref 70–99)
Glucose-Capillary: 206 mg/dL — ABNORMAL HIGH (ref 70–99)
Glucose-Capillary: 248 mg/dL — ABNORMAL HIGH (ref 70–99)
Glucose-Capillary: 260 mg/dL — ABNORMAL HIGH (ref 70–99)

## 2022-05-05 MED ORDER — ZINC SULFATE 220 (50 ZN) MG PO CAPS
220.0000 mg | ORAL_CAPSULE | Freq: Every day | ORAL | Status: DC
  Administered 2022-05-05 – 2022-05-07 (×3): 220 mg via ORAL
  Filled 2022-05-05 (×3): qty 1

## 2022-05-05 MED ORDER — VITAMIN C 500 MG PO TABS
250.0000 mg | ORAL_TABLET | Freq: Two times a day (BID) | ORAL | Status: DC
  Administered 2022-05-05 – 2022-05-07 (×4): 250 mg via ORAL
  Filled 2022-05-05 (×4): qty 1

## 2022-05-05 NOTE — Progress Notes (Signed)
[facility] Progress Note   67 y.o. female DM, HTN, HLD, OSA ([place] w/ CPAP), ESRD dialyzed MWF at [facility] followed by Dr. Murlean Iba. Her last outpt dialysis treatment was on Wednesday and she missed her treatment on Friday. Patient here with worsening gangrene of the right great toe which she states has been declining over the past 2 weeks. WBC 28K Tm100.6.    Assessment/ Plan:   ESRD - formerly on PD but now on iHD MWF @ [facility]  -Continue MWF dialysis Renal osteodystrophy -continue [name] and monitor phosphorus Anemia - Hb stable in 10-11s, no need for ESA currently.  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  Considering amputation HTN - controlled DM - per primary   Subjective:   Patient states procedure went well yesterday but considering amputation.  No issues with dialysis.  Otherwise no complaints   Objective:   BP 97/67 (BP Location: Right Arm)   Pulse 77   Temp 97.7 F (36.5 C) (Oral)   Resp 18   Ht 5' 4"$  (1.626 m)   Wt 76.8 kg   SpO2 96%   BMI 29.06 kg/m   Intake/Output Summary (Last 24 hours) at 05/05/2022 1015 Last data filed at 05/05/2022 [id number] Gross per 24 hour  Intake 680 ml  Output 1600 ml  Net -920 ml   Weight change: -0.6 kg  Physical Exam: General appearance: Lying in bed, no distress Head: NCAT Resp: Bilateral chest rise with no increased work of breathing Cardio: Normal rate, no edema GI: SNDNT+BS Extremities: Rt great toe gangrene Access: BCF good thrill  Imaging: PERIPHERAL VASCULAR CATHETERIZATION  Result Date: 05/04/2022 Images from the original result were not included.   Patient name: Kathryn Beck            MRN: [id number]        DOB: 04-07-1955          Sex: female  05/04/2022 Pre-operative Diagnosis: Right lower extremity critical limb ischemia with tissue loss of the first toe Post-operative diagnosis:  Same Surgeon:  Broadus John,  MD Procedure Performed: 1.  Ultrasound-guided micropuncture access of the left common femoral artery 2.  Second-order cannulation, right lower extremity angiogram. 3.  Drug-coated balloon angioplasty of the superficial femoral artery 5 x 100, 5 x 60 mm 4.  Drug-eluting stenting of the superficial femoral artery 6 x 165m 5.  Tibial artery angioplasty-posterior tibial artery 2 x 220 mm 6.  Device assisted closure-Mynx 7.  Contrast volume 610m8.  Local anesthesia only.   Indications: Patient is a [age]ear old female with history of right lower extremity critical limb ischemia with tissue loss of the great toe. With her comorbidities of end-stage renal disease, longstanding diabetes, she is at very high risk for amputation.  Recent ultrasound demonstrated some concern for stenosis at the level of the common femoral artery with severe tibial disease distally.  There was also multilevel occlusive disease within the superficial femoral artery. An ultrasound was obtained today to assess the severity of stenosis at the common femoral lesion.  This lesion did not appear flow-limiting by velocity criteria.  My discussion with Loretta's children was focused on high risk endovascular intervention in an effort to recanalize the superficial femoral artery, and posterior tibial artery to reestablish inline flow to the foot.  This is necessary for wound healing as BeSaeshaill likely need a first toe amputation in the future.  With her arterial disease, there is a high chance of failure as many of the lesions appear to be total occlusions, especially at the level of the ankle.  There is no target for bypass surgery.  Geanne will require superficial femoral artery stenting, balloon angioplasty of her tibial vessels.  If this is successful, I think there is a chance a toe amputation could heal.  Without inline flow, Betti would likely require below-knee amputation versus above-knee amputation.  With her current comorbidities, I do not  think she would walk with either of these.  Plan for angiogram today.  Findings: No inflow disease is appreciated on last angiogram. Right lower extremity, multiple, tandem, flow-limiting stenosis throughout the superficial femoral artery, P1 segment of the popliteal artery. Popliteal artery patent.  Large peroneal artery which occludes in the mid tibia.  Anterior tibial artery is initially patent, but is atretic, occluding at the mid tibia.  The posterior tibial artery has numerous, tandem occlusions, but continues into the level of the foot.  No flow in the forefoot.             Procedure:  The patient was identified in the holding area and taken to room 8.  The patient was then placed supine on the table and prepped and draped in the usual sterile fashion.  A time out was called.  Ultrasound was used to evaluate the left common femoral artery.  It was patent .  A digital ultrasound image was acquired.  A micropuncture needle was used to access the left common femoral artery under ultrasound guidance.  An 018 wire was advanced without resistance and a micropuncture sheath was placed.  The 018 wire was removed and a benson wire was placed.  The micropuncture sheath was exchanged for a 5 french sheath.  An omniflush catheter was advanced over the wire to the level of L-1.  Next, using the omniflush catheter and a benson wire, the aortic bifurcation was crossed and the catheter was placed into the right common femoral artery.  Runoff was obtained.  See results above.  I elected to intervene on the multilevel occlusive disease.  The patient was heparinized and a 6 [place] by 60 cm sheath was brought to the field and parked in the proximal superficial femoral artery from this location, a series of wires and catheters were used to cross the lesions in the SFA.  Initially I believe these lesions using a 5 x 100 mm drug-coated balloon.  This demonstrated significant improvement, however there was residual stenosis,  therefore I elected to stent the area using a drug-eluting stent sized 6 mm x 120 mm.  This was postdilated using a 5 mm balloon.  Next, I used a drug-coated balloon-5 x 60 mm in the P1 segment of the popliteal artery for greater than 70% stenosis.  Follow-up angiography demonstrated the stenosis.  Next, attention turned to runoff.  The 6 x 60 cm sheath was hubbed, and using a series of wires and catheters I was able to cannulate the posterior tibial artery and run a wire to the level of the ankle.  There were multiple, flow-limiting stenoses versus occlusions that were crossed.  A 2 x 2 20 mm balloon was brought to the field and expanded along the entirety of the posterior tibial artery.  I difficulty distally as there were several small occlusions.  I was unable to cross the most distal lesion with a balloon, however was able to cross with an 014 wire.  Follow-up  angiography demonstrated significant improvement, however there was only flow to the level of the midfoot.  I elected to attempt balloon angioplasty of the peroneal and anterior tibial arteries.  An 014 wire was passed down both of the vessels, however there was no outflow appreciated.  I elected not to pursue further intervention. Arteriotomy was managed with a minx device.  Impression: Successful resolution of multiple, tandem, flow-limiting stenosis throughout the SFA and above-knee popliteal artery.  Patient with severe tibial disease with very little outflow in the foot.  The posterior tibial artery was balloon angioplastied in an effort to improve distal flow, which it did, however I do not think this will provide enough perfusion to heal a toe amputation.  Pt ASA, Plavix, unable to tolerate statin.   Cassandria Santee, MD [facility] Office: [phone]     Labs: BMET Recent Labs  Lab 04/29/22 [phone] 04/30/22 0036 05/01/22 [id number] 05/02/22 0048 05/03/22 0049 05/04/22 0115 05/05/22 0037  NA 140 135 136 136 136  138 136  K 3.7 3.6 3.7 3.9 3.7 3.9 3.7  CL 96* 96* 93* 94* 97* 97* 95*  CO2 27 27 25 28 24 25 24  $ GLUCOSE 215* 109* 235* 150* 180* 258* 200*  BUN 71* 44* 61* 33* 52* 63* 35*  CREATININE 7.18* 5.47* 7.51* 5.07* 7.00* 8.91* 6.30*  CALCIUM 9.3 8.7* 8.5* 9.0 8.8* 8.9 8.8*  PHOS 6.8* 5.0* 7.8* 7.0* 7.1* 7.8* 6.5*   CBC Recent Labs  Lab 04/29/22 0116 04/30/22 0036 05/01/22 0825 05/04/22 0115  WBC 13.8* 16.4* 9.6 8.7  NEUTROABS 10.2* 12.6*  --   --   HGB 11.2* 11.2* 10.9* 10.7*  HCT 35.5* 35.4* 34.6* 35.1*  MCV 77.7* 78.0* 78.6* 80.0  PLT 184 149* 218 213    Medications:     amLODipine  10 mg Oral Daily   aspirin EC  81 mg Oral Daily   Chlorhexidine Gluconate Cloth  6 each Topical Q0600   clopidogrel  75 mg Oral Q breakfast   DULoxetine  60 mg Oral Daily   ezetimibe  10 mg Oral Daily   feeding supplement (NEPRO CARB STEADY)  237 mL Oral Q24H   heparin  5,000 Units Subcutaneous Q8H   insulin aspart  0-15 Units Subcutaneous TID WC   insulin aspart  3 Units Subcutaneous TID WC   insulin glargine-yfgn  30 Units Subcutaneous Daily   metoprolol succinate  50 mg Oral Daily   multivitamin  1 tablet Oral QHS   sertraline  50 mg Oral Daily   sodium chloride flush  3 mL Intravenous Q12H   sodium chloride flush  3 mL Intravenous Q12H   sucroferric oxyhydroxide  1,000 mg Oral TID WC

## 2022-05-05 NOTE — Progress Notes (Signed)
Nutrition Follow-up  DOCUMENTATION CODES:  Not applicable  INTERVENTION:  Continue Nepro Shake po once daily, each supplement provides 425 kcal and 19 grams protein Magic cup TID with meals, each supplement provides 290 kcal and 9 grams of protein to optimize PO intake Continue Renal MVI with minerals daily Vitamin C 29m BID and Zinc sulfate 2295mdaily to support wound healing  NUTRITION DIAGNOSIS:  Increased nutrient needs related to wound healing as evidenced by estimated needs. - remains applicable  GOAL:  Patient will meet greater than or equal to 90% of their needs - goal unmet, addressing via supplements and meals  MONITOR:  PO intake, Supplement acceptance, Labs, Weight trends, Skin, I & O's  REASON FOR ASSESSMENT:  Consult Diet education (Daughter requesting education)  ASSESSMENT:  [age]ear old [facility] with toe infection. PMH of ESRD on HD (formerly on PD), T2DM, anemia, depression, HTN. Pt admitted with dry gangrene R great toe.  2/8 s/p angiogram 2/12 s/p Ateriorgraphy of the lower extremity with angioplasty and placement of DES in the superficial femoral artery and angioplasty of the posterior tibial artery.   Considering first ray amputation however concern for poor wound healing.  Spoke with pt at bedside. No family present at time of visit. She reports ongoing variable PO intake. Denies n/v and having regular bowel movements. She states that she is consuming her Nepro shakes. Reviewed MAR, last Nepro given was on 2/10.   Meal completions: 2/10: 50% lunch, 25% dinner 2/11: 50% breakfast, 50% lunch, 100% dinner 2/12: 0% breakfast, 100% dinner 2/13: 25% breakfast  Unable to obtain post-HD weight yesterday.  Weight today 76.8 kg.   Medications: SSI 0-15 units TID, SSI 3 units daily, semglee 30 units daily, rena-vit, velphro  Labs:  BUN 35, Cr 6.30, anion gap 17, GFR 7, CBG's 150-200 x24 hours  Post HD net UF (2/12):  1.6L I/O's: -[id number] admit  Diet Order:   Diet Order             Diet Carb Modified Fluid consistency: Thin; Room service appropriate? Yes  Diet effective now                   EDUCATION NEEDS:  Not appropriate for education at this time  Skin:  Skin Assessment: Skin Integrity Issues: Skin Integrity Issues:: Diabetic Ulcer Diabetic Ulcer: R great toe  Last BM:  2/11  Height:  Ht Readings from Last 1 Encounters:  04/24/22 5' 4"$  (1.626 m)    Weight:  Wt Readings from Last 1 Encounters:  05/05/22 76.8 kg    Ideal Body Weight:  54.5 kg  BMI:  Body mass index is 29.06 kg/m.  Estimated Nutritional Needs:   Kcal:  1800-2000  Protein:  100-115 grams  Fluid:  1000 ml + UOP  AllClayborne DanaDN, LDN Clinical Nutrition

## 2022-05-05 NOTE — Progress Notes (Signed)
Mobility Specialist - Progress Note   05/05/22 1200  Mobility  Activity Ambulated with assistance in hallway  Level of Assistance Minimal assist, patient does 75% or more  Assistive Device Front wheel walker (chair follow)  Distance Ambulated (ft) 400 ft  Activity Response Tolerated well  Mobility Referral Yes  $Mobility charge 1 Mobility    Pt received in bed agreeable to mobility. No complaints throughout, took seated rest break x1 in hallway. MinA d/t slight knee buckling x2 and slight posterior lean. Chair follow for safety. Left in bed w/ call bell in reach and all needs met.   [place] Specialist Please contact via SecureChat or Rehab office at [phone]

## 2022-05-05 NOTE — Progress Notes (Signed)
PROGRESS NOTE  Kathryn Beck [id number] DOB: Mar 10, 1956 DOA: 04/24/2022 PCP: Fayrene Helper, MD  Brief History   Mrs. Sonnier is a 67 y.o. F with ESRD on HD MWF, DM, HTN, OSA not on CPAP, obesity, and depression who presented with nonhealing great toe wound.    On 05/04/2022 the patient underwent   1.  Ultrasound-guided micropuncture access of the left common femoral artery 2.  Second-order cannulation, right lower extremity angiogram. 3.  Drug-coated balloon angioplasty of the superficial femoral artery 5 x 100, 5 x 60 mm 4.  Drug-eluting stenting of the superficial femoral artery 6 x 132m 5.  Tibial artery angioplasty-posterior tibial artery 2 x 220 mm  With Dr. RVirl Cageyfor critical limb ischemia with tissue loss of the first great toe.   She has had successful resolutio of multiple, tandem, flow-limiting stenosis throughout the SFA and above-knee popliteal artery. 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 The posterior tibial artery was balloon angioplastied to improve distal flow, but it remains unlikely that it will be sufficient to heal a toe amputation.  Consultants  Nephrology Vascular Surgery  Procedures  Ateriorgraphy of the lower extremity with angioplasty and placement of DES in the superficial femoral artery and angioplasty of the posterior tibial artery.  Antibiotics   Anti-infectives (From admission, onward)    Start     Dose/Rate Route Frequency Ordered Stop   05/04/22 1620  vancomycin (VANCOREADY) 750 MG/150ML IVPB       Note to Pharmacy: WJillyn LedgerL: cabinet override      05/04/22 1620 05/05/22 0429   05/04/22 1200  vancomycin (VANCOREADY) IVPB 750 mg/150 mL        750 mg 150 mL/hr over 60 Minutes Intravenous Every M-W-F (Hemodialysis) 05/04/22 0736     04/27/22 1200  vancomycin (VANCOCIN) IVPB 1000 mg/200 mL premix  Status:  Discontinued        1,000 mg 200 mL/hr over 60 Minutes Intravenous Every M-W-F (Hemodialysis)  04/25/22 1304 05/04/22 0736   04/25/22 1800  vancomycin (VANCOREADY) IVPB 750 mg/150 mL  Status:  Discontinued        750 mg 150 mL/hr over 60 Minutes Intravenous Every T-Th-Sa (Hemodialysis) 04/25/22 0031 04/25/22 1304   04/25/22 1345  vancomycin (VANCOCIN) IVPB 1000 mg/200 mL premix        1,000 mg 200 mL/hr over 60 Minutes Intravenous  Once 04/25/22 1304 04/25/22 1838   04/25/22 0100  metroNIDAZOLE (FLAGYL) tablet 500 mg        500 mg Oral Every 12 hours 04/25/22 0004 05/01/22 1416   04/25/22 0015  cefTRIAXone (ROCEPHIN) 2 g in sodium chloride 0.9 % 100 mL IVPB        2 g 200 mL/hr over 30 Minutes Intravenous Daily at bedtime 04/25/22 0004 05/01/22 0905        Interval History/Subjective  The patient is resting well in bed. She states that she is doing well. No new complaints.  Objective   Vitals:  Vitals:   05/04/22 1730 05/04/22 1800  BP: (!) 79/49 (!) 90/48  Pulse: 73 72  Resp: 12 12  Temp:    SpO2:      Exam:  Constitutional:  Appears calm and comfortable Respiratory:  CTA bilaterally, no w/r/r.  Respiratory effort normal. No retractions or accessory muscle use Cardiovascular:  RRR, no m/r/g No LE extremity edema   Normal pedal pulses Abdomen:  Abdomen appears normal; no tenderness or masses No hernias No HSM  Musculoskeletal:  Digits/nails BUE: no clubbing, cyanosis, petechiae, infection exam of joints, bones, muscles of at least one of following: head/neck, RUE, LUE, RLE, LLE   strength and tone normal, no atrophy, no abnormal movements No tenderness, masses Normal ROM, no contractures  gait and station Skin:  No rashes, lesions, ulcers palpation of skin: no induration or nodules Neurologic:  CN 2-12 intact Sensation all 4 extremities intact Psychiatric:  Mental status Mood, affect appropriate Orientation to person, place, time  judgment and insight appear intact     I have personally reviewed the following:   Today's Data   Vitals:    05/04/22 1730 05/04/22 1800  BP: (!) 79/49 (!) 90/48  Pulse: 73 72  Resp: 12 12  Temp:    SpO2:       Lab Data  CBC    Component Value Date/Time   WBC 8.7 05/04/2022 0115   RBC 4.39 05/04/2022 0115   HGB 10.7 (L) 05/04/2022 0115   HCT 35.1 (L) 05/04/2022 0115   PLT 213 05/04/2022 0115   MCV 80.0 05/04/2022 0115   MCH 24.4 (L) 05/04/2022 0115   MCHC 30.5 05/04/2022 0115   RDW 18.3 (H) 05/04/2022 0115   LYMPHSABS 2.0 04/30/2022 0036   MONOABS 0.9 04/30/2022 0036   EOSABS 0.1 04/30/2022 0036   BASOSABS 0.1 04/30/2022 0036      Latest Ref Rng & Units 05/04/2022    1:15 AM 05/03/2022   12:49 AM 05/02/2022   12:48 AM  BMP  Glucose 70 - 99 mg/dL 258  180  150   BUN 8 - 23 mg/dL 63  52  33   Creatinine 0.44 - 1.00 mg/dL 8.91  7.00  5.07   Sodium 135 - 145 mmol/L 138  136  136   Potassium 3.5 - 5.1 mmol/L 3.9  3.7  3.9   Chloride 98 - 111 mmol/L 97  97  94   CO2 22 - 32 mmol/L 25  24  28   $ Calcium 8.9 - 10.3 mg/dL 8.9  8.8  9.0      Micro Data   Results for orders placed or performed during the hospital encounter of 04/14/22  Resp panel by RT-PCR (RSV, Flu A&B, Covid) Anterior Nasal Swab     Status: None   Collection Time: 04/14/22  9:33 PM   Specimen: Anterior Nasal Swab  Result Value Ref Range Status   SARS Coronavirus 2 by RT PCR NEGATIVE NEGATIVE Final    Comment: (NOTE) SARS-CoV-2 target nucleic acids are NOT DETECTED.  The SARS-CoV-2 RNA is generally detectable in upper respiratory specimens during the acute phase of infection. The lowest concentration of SARS-CoV-2 viral copies this assay can detect is 138 copies/mL. A negative result does not preclude SARS-Cov-2 infection and should not be used as the sole basis for treatment or other patient management decisions. A negative result may occur with  improper specimen collection/handling, submission of specimen other than nasopharyngeal swab, presence of viral mutation(s) within the areas targeted by this  assay, and inadequate number of viral copies(<138 copies/mL). A negative result must be combined with clinical observations, patient history, and epidemiological information. The expected result is Negative.  Fact Sheet for Patients:  EntrepreneurPulse.com.au  Fact Sheet for Healthcare Providers:  IncredibleEmployment.be  This test is no t yet approved or cleared by the [place] FDA and  has been authorized for detection and/or diagnosis of SARS-CoV-2 by FDA under an Emergency Use Authorization (EUA). This EUA will remain  in  effect (meaning this test can be used) for the duration of the COVID-19 declaration under Section 564(b)(1) of the Act, 21 U.S.C.section 360bbb-3(b)(1), unless the authorization is terminated  or revoked sooner.       Influenza A by PCR NEGATIVE NEGATIVE Final   Influenza B by PCR NEGATIVE NEGATIVE Final    Comment: (NOTE) The Xpert Xpress SARS-CoV-2/FLU/RSV plus assay is intended as an aid in the diagnosis of influenza from Nasopharyngeal swab specimens and should not be used as a sole basis for treatment. Nasal washings and aspirates are unacceptable for Xpert Xpress SARS-CoV-2/FLU/RSV testing.  Fact Sheet for Patients: EntrepreneurPulse.com.au  Fact Sheet for Healthcare Providers: IncredibleEmployment.be  This test is not yet approved or cleared by the [place] FDA and has been authorized for detection and/or diagnosis of SARS-CoV-2 by FDA under an Emergency Use Authorization (EUA). This EUA will remain in effect (meaning this test can be used) for the duration of the COVID-19 declaration under Section 564(b)(1) of the Act, 21 U.S.C. section 360bbb-3(b)(1), unless the authorization is terminated or revoked.     Resp Syncytial Virus by PCR NEGATIVE NEGATIVE Final    Comment: (NOTE) Fact Sheet for Patients: EntrepreneurPulse.com.au  Fact Sheet for  Healthcare Providers: IncredibleEmployment.be  This test is not yet approved or cleared by the [place] FDA and has been authorized for detection and/or diagnosis of SARS-CoV-2 by FDA under an Emergency Use Authorization (EUA). This EUA will remain in effect (meaning this test can be used) for the duration of the COVID-19 declaration under Section 564(b)(1) of the Act, 21 U.S.C. section 360bbb-3(b)(1), unless the authorization is terminated or revoked.  Performed at [facility], [address] [place], [place] [address]    *Note: Due to a large number of results and/or encounters for the requested time period, some results have not been displayed. A complete set of results can be found in Results Review.   Scheduled Meds:  amLODipine  10 mg Oral Daily   aspirin EC  81 mg Oral Daily   Chlorhexidine Gluconate Cloth  6 each Topical Q0600   [START ON 05/05/2022] clopidogrel  75 mg Oral Q breakfast   DULoxetine  60 mg Oral Daily   ezetimibe  10 mg Oral Daily   feeding supplement (NEPRO CARB STEADY)  237 mL Oral Q24H   heparin  5,000 Units Subcutaneous Q8H   insulin aspart  0-15 Units Subcutaneous TID WC   insulin aspart  3 Units Subcutaneous TID WC   insulin glargine-yfgn  30 Units Subcutaneous Daily   metoprolol succinate  50 mg Oral Daily   multivitamin  1 tablet Oral QHS   sertraline  50 mg Oral Daily   sodium chloride flush  3 mL Intravenous Q12H   sodium chloride flush  3 mL Intravenous Q12H   sucroferric oxyhydroxide  1,000 mg Oral TID WC   Continuous Infusions:  sodium chloride     sodium chloride     vancomycin     vancomycin 750 mg (05/04/22 1716)    Principal Problem:   Gangrene of toe of right foot ([place]) Active Problems:   PAD (peripheral artery disease) (HCC)   ESRD on hemodialysis ([place])   Type 2 diabetes mellitus with hyperglycemia (HCC)   Hyperlipidemia   Obesity   Chronic diastolic (congestive) heart failure (HCC)   Depression   HTN  (hypertension)   Neuropathy   LOS: 10 days    A & P  ESRD on hemodialysis [facility]) - Nephrology has been consulted.  _ Pt on T, Th, Sa HD schedule.  Type 2 diabetes mellitus with hyperglycemia (HCC) A1c 9.3% Glucose here well controlled now - Continue Semglee - Continue sliding scale corrections.  PAD (peripheral artery disease) (HCC) ABI 2/3 with severe disease, right 0.4 and Left 0.42 Angiogram 2/8 with diffuse disease -Appreciate vascular surgery's assistance - Continue Zetia - Intolerant of statins - Start aspirin   Gangrene of toe of right foot (HCC) - Continue vancomycin - Consult vascular surgery and orthopedics for aortogram and amputation - Pt underwent angiography with angioplasty today for critical limb ischemia 1.  Ultrasound-guided micropuncture access of the left common femoral artery 2.  Second-order cannulation, right lower extremity angiogram. 3.  Drug-coated balloon angioplasty of the superficial femoral artery 5 x 100, 5 x 60 mm 4.  Drug-eluting stenting of the superficial femoral artery 6 x 124m 5.  Tibial artery angioplasty-posterior tibial artery 2 x 220 mm  Hyperlipidemia - Continue Zetia - Hold statin due to transaminitis  Neuropathy - Continue Lyrica  HTN (hypertension) Blood pressure controlled - Continue amlodipine, metoprolol - Hold Imdur, clonidine  Depression - Continue Cymbalta and sertraline, and Lyrica  Chronic diastolic (congestive) heart failure (HCC) Appears euvolemic  Obesity BMI 31 Dietitian evaluated patient, malnutrition ruled out.  Severe protein-calorie malnutrition ([place]  Noted. Nutrition is consulted.    DVT prophylaxis: Heparin Code Status: Full Code Family Communication: None available Disposition Plan: TBD    Tigran Haynie, DO [facility] Direct contact: see [email or web address]  7PM-7AM contact night coverage as above 05/05/2022, 5:23 PM  LOS: 10 days

## 2022-05-05 NOTE — Progress Notes (Signed)
Patient ID: Kathryn Beck, female   DOB: [birth year], 67 y.o.   MRN: [id number] Patient is status post revascularization with Dr. Unk Lightning to the right lower extremity.  Patient still has minimal circulation to the foot.  Examination of the great toe shows gangrenous changes of the entire great toe.  I discussed with the patient's husband as well as her daughter and son on the phone and stated that the treatment options are not straightforward.  Discussed that she is at increased risk of the gangrenous great toe infection to spread.Marland Kitchen  However, amputation of the first ray has an increased risk of the wound not healing and then patient would require subsequent amputation.  Discussed that if the family wanted to proceed with first ray amputation surgery I could proceed either Wednesday or Friday.  Patient is at high risk of the wound not healing with ray amputation.

## 2022-05-05 NOTE — Progress Notes (Addendum)
Progress Note    05/05/2022 7:38 AM 1 Day Post-Op  Subjective:  no complaints   Vitals:   05/05/22 0040 05/05/22 0319  BP: (!) 120/53 97/67  Pulse: 72 77  Resp: 16 18  Temp: 98.2 F (36.8 C) 97.7 F (36.5 C)  SpO2: 93% 96%   Physical Exam: Cardiac:  regular Lungs:  non labored Extremities:  Left common femoral artery access site mild bleeding on bandage. No active bleeding. Groin soft without swelling or hematoma, right great toe dressed. Palpable PT pulse in right foot Abdomen:  obese, soft Neurologic: alert and oriented  CBC    Component Value Date/Time   WBC 8.7 05/04/2022 0115   RBC 4.39 05/04/2022 0115   HGB 10.7 (L) 05/04/2022 0115   HCT 35.1 (L) 05/04/2022 0115   PLT 213 05/04/2022 0115   MCV 80.0 05/04/2022 0115   MCH 24.4 (L) 05/04/2022 0115   MCHC 30.5 05/04/2022 0115   RDW 18.3 (H) 05/04/2022 0115   LYMPHSABS 2.0 04/30/2022 0036   MONOABS 0.9 04/30/2022 0036   EOSABS 0.1 04/30/2022 0036   BASOSABS 0.1 04/30/2022 0036    BMET    Component Value Date/Time   NA 136 05/05/2022 0037   NA 139 07/17/2019 1157   K 3.7 05/05/2022 0037   CL 95 (L) 05/05/2022 0037   CO2 24 05/05/2022 0037   GLUCOSE 200 (H) 05/05/2022 0037   BUN 35 (H) 05/05/2022 0037   BUN 43 (A) 07/17/2019 1157   CREATININE 6.30 (H) 05/05/2022 0037   CREATININE 3.74 (H) 08/23/2017 1407   CALCIUM 8.8 (L) 05/05/2022 0037   CALCIUM 9.4 10/15/2017 1258   GFRNONAA 7 (L) 05/05/2022 0037   GFRNONAA 12 (L) 08/23/2017 1407   GFRAA 8 (L) 07/19/2019 1210   GFRAA 14 (L) 08/23/2017 1407    INR    Component Value Date/Time   INR 1.0 03/03/2019 0800     Intake/Output Summary (Last 24 hours) at 05/05/2022 [id number] Last data filed at 05/05/2022 0330 Gross per 24 hour  Intake 630 ml  Output 1600 ml  Net -970 ml     Assessment/Plan:  67 y.o. female is s/p Aortogram, arteriogram RLE with DCB angioplasty of the SFA, drug eluting stenting of the SFA and PT angioplasty 1 Day Post-Op   Left  common femoral access without swelling or hematoma RLE well perfused with palpable right PT. Right great toe dressed She is optimized from vascular standpoint. Has inline flow into foot however severe tibial disease and outflow into the foot. Do not feel this is enough perfusion to heal toe amputation We can possibly attempt toe amputation with understanding that high risk of non healing Patient and her husband understand that she likely will require BKA vs AKA however there is no rush to make this decision at this time Continue local wound care to right toe. Continue to pain with betadine. Can allow this to demarcate Continue Aspirin, Plavix. Unable to tolerate Statins due to elevated LFTs  Karoline Caldwell, PA-C [facility] [phone] 05/05/2022 7:38 AM  VASCULAR STAFF ADDENDUM: I agree with the above.  Patient seen by Dr. Sharol Given, with similar feelings regarding to amputation. Patient maximally revascularized I think in her current state, patient would be best served with an above-knee amputation, however family is aware that I am happy to help with what ever level they choose.  Currently dry.  No rush to surgery.  Cassandria Santee, MD [facility] Phone Number: [phone] 05/05/2022 11:32 AM

## 2022-05-06 DIAGNOSIS — I96 Gangrene, not elsewhere classified: Secondary | ICD-10-CM | POA: Diagnosis not present

## 2022-05-06 DIAGNOSIS — E1152 Type 2 diabetes mellitus with diabetic peripheral angiopathy with gangrene: Secondary | ICD-10-CM | POA: Diagnosis not present

## 2022-05-06 LAB — RENAL FUNCTION PANEL
Albumin: 2.7 g/dL — ABNORMAL LOW (ref 3.5–5.0)
Anion gap: 16 — ABNORMAL HIGH (ref 5–15)
BUN: 51 mg/dL — ABNORMAL HIGH (ref 8–23)
CO2: 26 mmol/L (ref 22–32)
Calcium: 9.2 mg/dL (ref 8.9–10.3)
Chloride: 94 mmol/L — ABNORMAL LOW (ref 98–111)
Creatinine, Ser: 8.11 mg/dL — ABNORMAL HIGH (ref 0.44–1.00)
GFR, Estimated: 5 mL/min — ABNORMAL LOW (ref >60)
Glucose, Bld: 263 mg/dL — ABNORMAL HIGH (ref 70–99)
Phosphorus: 8.4 mg/dL — ABNORMAL HIGH (ref 2.5–4.6)
Potassium: 3.6 mmol/L (ref 3.5–5.1)
Sodium: 136 mmol/L (ref 135–145)

## 2022-05-06 LAB — GLUCOSE, CAPILLARY
Glucose-Capillary: 237 mg/dL — ABNORMAL HIGH (ref 70–99)
Glucose-Capillary: 112 mg/dL — ABNORMAL HIGH (ref 70–99)
Glucose-Capillary: 180 mg/dL — ABNORMAL HIGH (ref 70–99)

## 2022-05-06 LAB — CBC
HCT: 32 % — ABNORMAL LOW (ref 36.0–46.0)
Hemoglobin: 10.1 g/dL — ABNORMAL LOW (ref 12.0–15.0)
MCH: 25.1 pg — ABNORMAL LOW (ref 26.0–34.0)
MCHC: 31.6 g/dL (ref 30.0–36.0)
MCV: 79.4 fL — ABNORMAL LOW (ref 80.0–100.0)
Platelets: 243 10*3/uL (ref 150–400)
RBC: 4.03 MIL/uL (ref 3.87–5.11)
RDW: 18.6 % — ABNORMAL HIGH (ref 11.5–15.5)
WBC: 10.7 10*3/uL — ABNORMAL HIGH (ref 4.0–10.5)
nRBC: 1 % — ABNORMAL HIGH (ref 0.0–0.2)

## 2022-05-06 NOTE — Progress Notes (Signed)
[facility] Progress Note   67 y.o. female DM, HTN, HLD, OSA ([place] w/ CPAP), ESRD dialyzed MWF at [facility] followed by Dr. Murlean Iba. Her last outpt dialysis treatment was on Wednesday and she missed her treatment on Friday. Patient here with worsening gangrene of the right great toe which she states has been declining over the past 2 weeks. WBC 28K Tm100.6.    Assessment/ Plan:   ESRD - formerly on PD but now on iHD MWF @ [facility]  -Continue MWF dialysis Renal osteodystrophy -continue [name] and monitor phosphorus Anemia - Hb stable in 10-11s, no need for ESA currently.  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  Considering amputation HTN - controlled DM - per primary   Subjective:   Feels well with no complaints   Objective:   BP (!) 112/54   Pulse 65   Temp 97.7 F (36.5 C) (Oral)   Resp 15   Ht 5' 4"$  (1.626 m)   Wt 79.1 kg   SpO2 98%   BMI 29.93 kg/m   Intake/Output Summary (Last 24 hours) at 05/06/2022 1155 Last data filed at 05/06/2022 0300 Gross per 24 hour  Intake 123 ml  Output 0 ml  Net 123 ml   Weight change:   Physical Exam: General appearance: Lying in bed, no distress Head: NCAT Resp: Bilateral chest rise with no increased work of breathing Cardio: Normal rate, no edema GI: SNDNT+BS Extremities: Rt great toe gangrene Access: BCF good thrill  Imaging: No results found.  Labs: BMET Recent Labs  Lab 04/30/22 0036 05/01/22 0039 05/02/22 0048 05/03/22 0049 05/04/22 0115 05/05/22 0037 05/06/22 0037  NA 135 136 136 136 138 136 136  K 3.6 3.7 3.9 3.7 3.9 3.7 3.6  CL 96* 93* 94* 97* 97* 95* 94*  CO2 27 25 28 24 25 24 26  $ GLUCOSE 109* 235* 150* 180* 258* 200* 263*  BUN 44* 61* 33* 52* 63* 35* 51*  CREATININE 5.47* 7.51* 5.07* 7.00* 8.91* 6.30* 8.11*  CALCIUM 8.7* 8.5* 9.0 8.8* 8.9 8.8* 9.2  PHOS 5.0* 7.8* 7.0* 7.1* 7.8* 6.5* 8.4*   CBC Recent  Labs  Lab 04/30/22 0036 05/01/22 0825 05/04/22 0115  WBC 16.4* 9.6 8.7  NEUTROABS 12.6*  --   --   HGB 11.2* 10.9* 10.7*  HCT 35.4* 34.6* 35.1*  MCV 78.0* 78.6* 80.0  PLT 149* 218 213    Medications:     amLODipine  10 mg Oral Daily   ascorbic acid  250 mg Oral BID   aspirin EC  81 mg Oral Daily   Chlorhexidine Gluconate Cloth  6 each Topical Q0600   clopidogrel  75 mg Oral Q breakfast   DULoxetine  60 mg Oral Daily   ezetimibe  10 mg Oral Daily   feeding supplement (NEPRO CARB STEADY)  237 mL Oral Q24H   heparin  5,000 Units Subcutaneous Q8H   insulin aspart  0-15 Units Subcutaneous TID WC   insulin aspart  3 Units Subcutaneous TID WC   insulin glargine-yfgn  30 Units Subcutaneous Daily   metoprolol succinate  50 mg Oral Daily   multivitamin  1 tablet Oral QHS   sertraline  50 mg Oral Daily   sodium chloride flush  3 mL Intravenous Q12H   sodium chloride flush  3 mL Intravenous Q12H   sucroferric oxyhydroxide  1,000 mg Oral TID WC   zinc sulfate  220 mg Oral Daily

## 2022-05-06 NOTE — Progress Notes (Signed)
Patient ID: Kathryn Beck, female   DOB: [birth year], 67 y.o.   MRN: [id number] Patient seen in follow-up for gangrenous ulcer right great toe.  Examination the ulcer is drying out with dry gangrene.  Patient does not have palpable pulses.  Patient's husband is at bedside and patient's daughter is on the phone.  Discussed that with a first ray amputation she has an increased risk of the wound not healing potential for higher level amputation.  Family states they only want to proceed with 1 surgery.  Discussed that I feel that it is safe to proceed with observation.  Patient and family states they would like to further discuss this and if they want to proceed with surgery and they will notify me tomorrow.

## 2022-05-06 NOTE — Progress Notes (Addendum)
  Progress Note    05/06/2022 8:32 AM 2 Days Post-Op  Subjective:  no complaints   Vitals:   05/06/22 0438 05/06/22 0812  BP: (!) 153/61 135/60  Pulse: 76   Resp: 18   Temp: 98.4 F (36.9 C)   SpO2: 99%    Physical Exam: Lungs:  non labored Incisions:  L groin cath site without hematoma Extremities:  R GT dry gangrene Neurologic: A&O  CBC    Component Value Date/Time   WBC 8.7 05/04/2022 0115   RBC 4.39 05/04/2022 0115   HGB 10.7 (L) 05/04/2022 0115   HCT 35.1 (L) 05/04/2022 0115   PLT 213 05/04/2022 0115   MCV 80.0 05/04/2022 0115   MCH 24.4 (L) 05/04/2022 0115   MCHC 30.5 05/04/2022 0115   RDW 18.3 (H) 05/04/2022 0115   LYMPHSABS 2.0 04/30/2022 0036   MONOABS 0.9 04/30/2022 0036   EOSABS 0.1 04/30/2022 0036   BASOSABS 0.1 04/30/2022 0036    BMET    Component Value Date/Time   NA 136 05/06/2022 0037   NA 139 07/17/2019 1157   K 3.6 05/06/2022 0037   CL 94 (L) 05/06/2022 0037   CO2 26 05/06/2022 0037   GLUCOSE 263 (H) 05/06/2022 0037   BUN 51 (H) 05/06/2022 0037   BUN 43 (A) 07/17/2019 1157   CREATININE 8.11 (H) 05/06/2022 0037   CREATININE 3.74 (H) 08/23/2017 1407   CALCIUM 9.2 05/06/2022 0037   CALCIUM 9.4 10/15/2017 1258   GFRNONAA 5 (L) 05/06/2022 0037   GFRNONAA 12 (L) 08/23/2017 1407   GFRAA 8 (L) 07/19/2019 1210   GFRAA 14 (L) 08/23/2017 1407    INR    Component Value Date/Time   INR 1.0 03/03/2019 0800     Intake/Output Summary (Last 24 hours) at 05/06/2022 1572 Last data filed at 05/06/2022 0300 Gross per 24 hour  Intake 123 ml  Output 0 ml  Net 123 ml     Assessment/Plan:  67 y.o. female is s/p  Aortogram, arteriogram RLE with DCB angioplasty of the SFA, drug eluting stenting of the SFA and PT angioplasty  2 Days Post-Op   L groin without hematoma; bandage removed Patient is considered maximally revascularized from our standpoint She currently would only consent to toe amputation however is aware there is a high risk of this  not healing.  She is also aware that an above knee amputation would give her the best chance to heal with only 1 surgery.  She plans to discuss this further with family today and will have her decision tomorrow   Dagoberto Ligas, PA-C [facility] [phone] 05/06/2022 8:32 AM  VASCULAR STAFF ADDENDUM: I agree with the above.  Patient was at dialysis when I attempted to see her. I called family who wanted to move forward with medical management, with plans to pursue either below-knee or above-knee amputation at a later date.  I appreciate Dr. Jess Barters help regarding future amputation.  Cassandria Santee, MD [facility] Phone Number: [phone] 05/06/2022 4:16 PM

## 2022-05-06 NOTE — Progress Notes (Signed)
PROGRESS NOTE    Kathryn Beck  [id number] DOB: September 19, 1955 DOA: 04/24/2022 PCP: Fayrene Helper, MD    Brief Narrative:  67 year old with ESRD on hemodialysis, diabetes type 2, hypertension, stress sleep apnea not using CPAP at night, obesity and depression presented with nonhealing great toe wound on the left side.  Underwent angiogram with multiple procedures.  Now likely will need above-knee amputation due to poor vasculature.  Pending surgery for discharge.   Assessment & Plan:   Peripheral artery disease with right toe gangrene, critical ischemia: Aortogram, arteriogram right lower extremity with angioplasty of SFA, drug-eluting stent of the SFA and PT angioplasty by vascular surgery 2/12. Revascularization optimized, however no good blood flow found peripherally. Great toe gangrene, surgical resection options has been under discussion.  Recommended right above-knee amputation as per ray amputation will not heal due to not having any blood supplies. Currently remains on aspirin, Zetia.  Continue vancomycin until surgical removal of the toe.  ESRD on hemodialysis: Stable.  Receiving dialysis on her schedule.  Type 2 diabetes, uncontrolled with hyperglycemia: Blood sugars better today.  A1c at 9.3.  Continue similar dose of insulin today.  Hyperlipidemia: On Zetia.  Neuropathic: On Lyrica.  Patient also on Cymbalta and sertraline.  Essential hypertension: On amlodipine and metoprolol.  Hospital-acquired delirium: Delirium precautions.  Fall precautions.  Avoid narcotics and benzodiazepines.  Day and night regulation.  DVT prophylaxis: heparin injection 5,000 Units Start: 05/04/22 2200   Code Status: Full code Family Communication: Husband at the bedside Disposition Plan: Status is: Inpatient Remains inpatient appropriate because: Inpatient surgery planned     Consultants:  Vascular surgery Orthopedic surgery Nephrology  Procedures:  Angioplasty  2/2  Antimicrobials:  Vancomycin 2/2---   Subjective: Patient seen in the morning rounds.  Husband was at the bedside.  Patient is pleasant.  Denies any complaints.  Family just to deciding about whether to go for a single radical surgery or to go for to amputation only. Husband reported patient occasionally getting confused but far better than earlier days.  Objective: Vitals:   05/06/22 0812 05/06/22 1013 05/06/22 1027 05/06/22 1100  BP: 135/60 (!) 130/52 (!) 138/51 (!) 114/54  Pulse:  70 67 68  Resp:  14 16 (!) 30  Temp:  97.7 F (36.5 C)    TempSrc:  Oral    SpO2:  97% 97%   Weight:   79.1 kg   Height:        Intake/Output Summary (Last 24 hours) at 05/06/2022 1117 Last data filed at 05/06/2022 0300 Gross per 24 hour  Intake 123 ml  Output 0 ml  Net 123 ml   Filed Weights   05/04/22 1831 05/05/22 0319 05/06/22 1027  Weight: 78.7 kg 76.8 kg 79.1 kg    Examination:  General exam: Appears calm and comfortable  Respiratory system: No added sounds. Cardiovascular system: S1 & S2 heard, RRR.  Gastrointestinal system: Abdomen is nondistended, soft and nontender. No organomegaly or masses felt. Normal bowel sounds heard. Central nervous system: Alert and oriented. No focal neurological deficits. Extremities: Symmetric 5 x 5 power. Right great toe with dry gangrene.  No palpable pedal pulses. Left upper extremity with AV fistula.     Data Reviewed: I have personally reviewed following labs and imaging studies  CBC: Recent Labs  Lab 04/30/22 0036 05/01/22 0825 05/04/22 0115  WBC 16.4* 9.6 8.7  NEUTROABS 12.6*  --   --   HGB 11.2* 10.9* 10.7*  HCT 35.4* 34.6* 35.1*  MCV 78.0* 78.6* 80.0  PLT 149* 218 [id number]   Basic Metabolic Panel: Recent Labs  Lab 04/30/22 0036 05/01/22 0039 05/02/22 0048 05/03/22 0049 05/04/22 0115 05/05/22 0037 05/06/22 0037  NA 135   < > 136 136 138 136 136  K 3.6   < > 3.9 3.7 3.9 3.7 3.6  CL 96*   < > 94* 97* 97* 95* 94*  CO2 27    < > 28 24 25 24 26  $ GLUCOSE 109*   < > 150* 180* 258* 200* 263*  BUN 44*   < > 33* 52* 63* 35* 51*  CREATININE 5.47*   < > 5.07* 7.00* 8.91* 6.30* 8.11*  CALCIUM 8.7*   < > 9.0 8.8* 8.9 8.8* 9.2  MG 1.9  --   --   --   --   --   --   PHOS 5.0*   < > 7.0* 7.1* 7.8* 6.5* 8.4*   < > = values in this interval not displayed.   GFR: Estimated Creatinine Clearance: 6.9 mL/min (A) (by C-G formula based on SCr of 8.11 mg/dL (H)). Liver Function Tests: Recent Labs  Lab 05/02/22 0048 05/03/22 0049 05/04/22 0115 05/05/22 0037 05/06/22 0037  ALBUMIN 2.7* 2.7* 2.7* 2.7* 2.7*   No results for input(s): "LIPASE", "AMYLASE" in the last 168 hours. No results for input(s): "AMMONIA" in the last 168 hours. Coagulation Profile: No results for input(s): "INR", "PROTIME" in the last 168 hours. Cardiac Enzymes: No results for input(s): "CKTOTAL", "CKMB", "CKMBINDEX", "TROPONINI" in the last 168 hours. BNP (last 3 results) No results for input(s): "PROBNP" in the last 8760 hours. HbA1C: No results for input(s): "HGBA1C" in the last 72 hours. CBG: Recent Labs  Lab 05/05/22 0608 05/05/22 1118 05/05/22 1642 05/05/22 2108 05/06/22 0601  GLUCAP 157* 150* 260* 248* 237*   Lipid Profile: No results for input(s): "CHOL", "HDL", "LDLCALC", "TRIG", "CHOLHDL", "LDLDIRECT" in the last 72 hours. Thyroid Function Tests: No results for input(s): "TSH", "T4TOTAL", "FREET4", "T3FREE", "THYROIDAB" in the last 72 hours. Anemia Panel: No results for input(s): "VITAMINB12", "FOLATE", "FERRITIN", "TIBC", "IRON", "RETICCTPCT" in the last 72 hours. Sepsis Labs: No results for input(s): "PROCALCITON", "LATICACIDVEN" in the last 168 hours.  No results found for this or any previous visit (from the past 240 hour(s)).       Radiology Studies: No results found.      Scheduled Meds:  amLODipine  10 mg Oral Daily   ascorbic acid  250 mg Oral BID   aspirin EC  81 mg Oral Daily   Chlorhexidine Gluconate  Cloth  6 each Topical Q0600   clopidogrel  75 mg Oral Q breakfast   DULoxetine  60 mg Oral Daily   ezetimibe  10 mg Oral Daily   feeding supplement (NEPRO CARB STEADY)  237 mL Oral Q24H   heparin  5,000 Units Subcutaneous Q8H   insulin aspart  0-15 Units Subcutaneous TID WC   insulin aspart  3 Units Subcutaneous TID WC   insulin glargine-yfgn  30 Units Subcutaneous Daily   metoprolol succinate  50 mg Oral Daily   multivitamin  1 tablet Oral QHS   sertraline  50 mg Oral Daily   sodium chloride flush  3 mL Intravenous Q12H   sodium chloride flush  3 mL Intravenous Q12H   sucroferric oxyhydroxide  1,000 mg Oral TID WC   zinc sulfate  220 mg Oral Daily   Continuous Infusions:  sodium chloride     sodium chloride  vancomycin Stopped (05/04/22 2209)     LOS: 12 days    Time spent: 35 minutes.    Barb Merino, MD [facility] Pager [phone]

## 2022-05-07 ENCOUNTER — Encounter (HOSPITAL_BASED_OUTPATIENT_CLINIC_OR_DEPARTMENT_OTHER): Payer: 59

## 2022-05-07 ENCOUNTER — Ambulatory Visit (HOSPITAL_BASED_OUTPATIENT_CLINIC_OR_DEPARTMENT_OTHER): Payer: 59 | Admitting: Family

## 2022-05-07 ENCOUNTER — Other Ambulatory Visit (HOSPITAL_BASED_OUTPATIENT_CLINIC_OR_DEPARTMENT_OTHER): Payer: 59

## 2022-05-07 DIAGNOSIS — I96 Gangrene, not elsewhere classified: Secondary | ICD-10-CM | POA: Diagnosis not present

## 2022-05-07 LAB — RENAL FUNCTION PANEL
Albumin: 2.8 g/dL — ABNORMAL LOW (ref 3.5–5.0)
Anion gap: 15 (ref 5–15)
BUN: 25 mg/dL — ABNORMAL HIGH (ref 8–23)
CO2: 27 mmol/L (ref 22–32)
Calcium: 9.2 mg/dL (ref 8.9–10.3)
Chloride: 94 mmol/L — ABNORMAL LOW (ref 98–111)
Creatinine, Ser: 5.19 mg/dL — ABNORMAL HIGH (ref 0.44–1.00)
GFR, Estimated: 9 mL/min — ABNORMAL LOW (ref >60)
Glucose, Bld: 183 mg/dL — ABNORMAL HIGH (ref 70–99)
Phosphorus: 4.9 mg/dL — ABNORMAL HIGH (ref 2.5–4.6)
Potassium: 3.6 mmol/L (ref 3.5–5.1)
Sodium: 136 mmol/L (ref 135–145)

## 2022-05-07 LAB — GLUCOSE, CAPILLARY
Glucose-Capillary: 143 mg/dL — ABNORMAL HIGH (ref 70–99)
Glucose-Capillary: 199 mg/dL — ABNORMAL HIGH (ref 70–99)

## 2022-05-07 MED ORDER — CLOPIDOGREL BISULFATE 75 MG PO TABS: 75.0000 mg | ORAL_TABLET | Freq: Every day | ORAL | 2 refills | Status: AC

## 2022-05-07 NOTE — Progress Notes (Signed)
Mobility Specialist - Progress Note   05/07/22 1100  Mobility  Activity Ambulated with assistance in hallway  Level of Assistance Contact guard assist, steadying assist  Assistive Device Front wheel walker  Distance Ambulated (ft) 500 ft  Activity Response Tolerated well  Mobility Referral Yes  $Mobility charge 1 Mobility    Pt received in bed agreeable to mobility. No complaints throughout, took seated break x1 in hallway. Left in bed w/ all needs met and call bell within reach.   [place] Specialist Please contact via SecureChat or Rehab office at [phone]

## 2022-05-07 NOTE — Progress Notes (Addendum)
[facility] Progress Note   67 y.o. female DM, HTN, HLD, OSA ([place] w/ CPAP), ESRD dialyzed MWF at [facility] followed by Dr. Murlean Iba. Her last outpt dialysis treatment was on Wednesday and she missed her treatment on Friday. Patient here with worsening gangrene of the right great toe which she states has been declining over the past 2 weeks. WBC 28K Tm100.6.    Assessment/ Plan:   ESRD - formerly on PD but now on iHD MWF @ [facility]  -Continue MWF dialysis Renal osteodystrophy -continue [name] and monitor phosphorus Anemia - Hb stable in 10-11s, no need for ESA currently.  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  Considering amputation HTN - bp lower, hold norvasc DM - per primary   Subjective:   Patient feels well today with no complaints.  Tolerated dialysis yesterday with no issues.  Still contemplative regarding amputation   Objective:   BP (!) 96/46 (BP Location: Right Arm)   Pulse 72   Temp 98.4 F (36.9 C) (Oral)   Resp 18   Ht 5' 4"$  (1.626 m)   Wt 77 kg   SpO2 96%   BMI 29.15 kg/m   Intake/Output Summary (Last 24 hours) at 05/07/2022 [id number] Last data filed at 05/06/2022 2030 Gross per 24 hour  Intake 240 ml  Output 2300 ml  Net -2060 ml   Weight change:   Physical Exam: General appearance: Lying in bed, no distress Head: NCAT Resp: Bilateral chest rise with no increased work of breathing Cardio: Normal rate, no edema GI: SNDNT+BS Extremities: Rt great toe gangrene Access: BCF good thrill  Imaging: No results found.  Labs: BMET Recent Labs  Lab 05/01/22 0039 05/02/22 0048 05/03/22 0049 05/04/22 0115 05/05/22 0037 05/06/22 0037 05/07/22 0101  NA 136 136 136 138 136 136 136  K 3.7 3.9 3.7 3.9 3.7 3.6 3.6  CL 93* 94* 97* 97* 95* 94* 94*  CO2 25 28 24 25 24 26 27  $ GLUCOSE 235* 150* 180* 258* 200* 263* 183*  BUN 61* 33* 52* 63* 35* 51* 25*  CREATININE  7.51* 5.07* 7.00* 8.91* 6.30* 8.11* 5.19*  CALCIUM 8.5* 9.0 8.8* 8.9 8.8* 9.2 9.2  PHOS 7.8* 7.0* 7.1* 7.8* 6.5* 8.4* 4.9*   CBC Recent Labs  Lab 05/01/22 0825 05/04/22 0115 05/06/22 1224  WBC 9.6 8.7 10.7*  HGB 10.9* 10.7* 10.1*  HCT 34.6* 35.1* 32.0*  MCV 78.6* 80.0 79.4*  PLT 218 213 243    Medications:     ascorbic acid  250 mg Oral BID   aspirin EC  81 mg Oral Daily   Chlorhexidine Gluconate Cloth  6 each Topical Q0600   clopidogrel  75 mg Oral Q breakfast   DULoxetine  60 mg Oral Daily   ezetimibe  10 mg Oral Daily   feeding supplement (NEPRO CARB STEADY)  237 mL Oral Q24H   heparin  5,000 Units Subcutaneous Q8H   insulin aspart  0-15 Units Subcutaneous TID WC   insulin aspart  3 Units Subcutaneous TID WC   insulin glargine-yfgn  30 Units Subcutaneous Daily   metoprolol succinate  50 mg Oral Daily   multivitamin  1 tablet Oral QHS   sertraline  50 mg Oral Daily   sodium chloride flush  3 mL Intravenous Q12H   sodium chloride flush  3 mL Intravenous Q12H   sucroferric oxyhydroxide  1,000 mg Oral TID WC  zinc sulfate  220 mg Oral Daily

## 2022-05-07 NOTE — TOC Transition Note (Signed)
Transition of Care [facility]) - CM/SW Discharge Note   Patient Details  Name: Kathryn Beck MRN: [id number] Date of Birth: 08-30-1955  [facility]) CM/SW Contact:  Zenon Mayo, RN Phone Number: 05/07/2022, 10:50 AM   Clinical Narrative:    Patient is for dc today, NCM notified [facility].       Barriers to Discharge: Continued Medical Work up   Patient Goals and CMS Choice CMS Medicare.gov Compare Post Acute Care list provided to:: Patient Represenative (must comment) Choice offered to / list presented to : Adult Children  Discharge Placement                         Discharge Plan and Services Additional resources added to the After Visit Summary for   In-house Referral: NA Discharge Planning Services: CM Consult Post Acute Care Choice: Home Health          DME Arranged: N/A DME Agency: NA       HH Arranged: RN, PT HH Agency: [place] [facility] Agency Contacted: 05/01/22 Time [place]: [id number] Representative spoke with at [place]: [place] Determinants of Health ([place]) Interventions SDOH Screenings   Food Insecurity: No Food Insecurity (04/24/2022)  Housing: Low Risk  (04/24/2022)  Transportation Needs: No Transportation Needs (04/24/2022)  Utilities: Not At Risk (04/24/2022)  Alcohol Screen: Low Risk  (10/03/2019)  Depression (PHQ2-9): High Risk (04/21/2022)  Financial Resource Strain: High Risk (05/08/2021)  Physical Activity: Inactive (11/20/2021)  Social Connections: Socially Integrated (05/08/2021)  Stress: Stress Concern Present (11/20/2021)  Tobacco Use: Low Risk  (05/05/2022)     Readmission Risk Interventions    05/01/2022    3:54 PM  Readmission Risk Prevention Plan  Transportation Screening Complete  HRI or [place] Complete  Palliative Care Screening Not Applicable  Medication Review (RN Care Manager) Complete

## 2022-05-07 NOTE — Discharge Summary (Signed)
Physician Discharge Summary  Kathryn Beck [id number] DOB: 1955/07/16 DOA: 04/24/2022  PCP: Fayrene Helper, MD  Admit date: 04/24/2022 Discharge date: 05/07/2022  Admitted From: Home Disposition: Home with home health PT OT  Recommendations for Outpatient Follow-up:  Follow up with PCP in 1-2 weeks Orthopedics will schedule follow-up with you  Home Health: PT/RN Equipment/Devices: Available at home  Discharge Condition: Stable CODE STATUS: Full code Diet recommendation: Low-salt and low-carb diet  Discharge summary: 67 year old with ESRD on hemodialysis, diabetes type 2, hypertension,  sleep apnea not using CPAP, obesity and depression presented with nonhealing great toe wound on the right side.  Underwent angiogram with multiple procedures.  Now likely will need above-knee amputation due to poor vasculature.  Patient currently with dry gangrene.  Decided to postpone surgery hence is going home today with outpatient follow-up with orthopedics to decide level of amputation.  Assessment & Plan:   Peripheral artery disease with right toe gangrene, critical ischemia: Aortogram, arteriogram right lower extremity with angioplasty of SFA, drug-eluting stent of the SFA and PT angioplasty by vascular surgery 2/12. Revascularization optimized, however no good blood flow found peripherally. Great toe gangrene, surgical resection options has been under discussion.  Recommended right above-knee amputation as ray amputation will not heal due to not having any blood supplies. Patient and family are undecided on amputation.  Discussed with vascular surgery and orthopedic surgery.  It was recommended that patient is fairly stable with dry gangrene and there is no emergency to do amputations of family have enough time to think about it.  She will be discharged home.  Antibiotics will be discontinued as there is no evidence of a spreading infection.  Dr. Sharol Given to follow-up as outpatient. Currently  remains on aspirin, Zetia.     ESRD on hemodialysis: Stable.  Receiving dialysis on her schedule.  Does have outpatient schedule.   Type 2 diabetes, uncontrolled with hyperglycemia: Blood sugars better today.  A1c at 9.3.  Resume increased dose of insulin from home doses.    Hyperlipidemia: On Zetia.   Neuropathic: On Lyrica.  Patient also on Cymbalta and sertraline.   Essential hypertension: On amlodipine, clonidine and metoprolol at home.  Her blood pressure showed normal or low normal.  Amlodipine and clonidine will be discontinued.  Continue metoprolol.   Hospital-acquired delirium: Improved.  Patient does have a dry gangrene.  Since she is not willing to have surgical resection now, stable to discharge with outpatient follow-up.  Discharge Diagnoses:  Principal Problem:   Gangrene of toe of right foot ([place]) Active Problems:   PAD (peripheral artery disease) (HCC)   ESRD on hemodialysis ([place])   Type 2 diabetes mellitus with hyperglycemia (H[facility])   Hyperlipidemia   Obesity   Chronic diastolic (congestive) heart failure ([facility])   Depression   HTN (hypertension)   Neuropathy    Discharge Instructions  Discharge Instructions     Ambulatory referral to Nutrition and Diabetic Education   Complete by: As directed    Diet - low sodium heart healthy   Complete by: As directed    Diet Carb Modified   Complete by: As directed    Discharge wound care:   Complete by: As directed    Paint digit with betadine (povidone-iodine) swabstick and allow to air-dry. When dry, cover with dry gauze and secure with conform bandaging. Change daily.   Increase activity slowly   Complete by: As directed       Allergies as of 05/07/2022  Reactions   Ace Inhibitors Anaphylaxis, Swelling   Penicillins Itching, Swelling, Other (See Comments)   Did it involve swelling of the face/tongue/throat, SOB, or low BP? Unknown Did it involve sudden or severe rash/hives, skin peeling, or any reaction  on the inside of your mouth or nose? Unknown Did you need to seek medical attention at a hospital or doctor's office? Unknown When did it last happen?      years  If all above answers are "NO", may proceed with cephalosporin use.   Statins Other (See Comments)   elevated LFT's   Albuterol Swelling        Medication List     STOP taking these medications    amLODipine 10 MG tablet Commonly known as: NORVASC   calcitRIOL 0.25 MCG capsule Commonly known as: ROCALTROL   ciprofloxacin-dexamethasone OTIC suspension Commonly known as: CIPRODEX   cloNIDine 0.2 MG tablet Commonly known as: Catapres   doxycycline 100 MG tablet Commonly known as: VIBRA-TABS   isosorbide mononitrate 30 MG 24 hr tablet Commonly known as: IMDUR       TAKE these medications    aspirin 81 MG chewable tablet CHEW 2 TABLETS BY MOUTH ONCE DAILY What changed: See the new instructions.   clopidogrel 75 MG tablet Commonly known as: PLAVIX Take 1 tablet (75 mg total) by mouth daily with breakfast. Start taking on: May 08, 2022   DULoxetine 60 MG capsule Commonly known as: CYMBALTA TAKE (1) CAPSULE BY MOUTH ONCE DAILY. What changed: See the new instructions.   ezetimibe 10 MG tablet Commonly known as: ZETIA TAKE ONE TABLET BY MOUTH EVERY DAY   Fiasp FlexTouch 100 UNIT/ML FlexTouch Pen Generic drug: insulin aspart Inject 18-30 Units into the skin with breakfast, with lunch, and with evening meal.   FreeStyle Libre 2 Reader Devi 1 each by Does not apply route daily.   FreeStyle Libre 2 Sensor Misc 1 each by Does not apply route every 14 (fourteen) days.   furosemide 80 MG tablet Commonly known as: LASIX Take 80 mg by mouth 2 (two) times daily.   hydrOXYzine 25 MG tablet Commonly known as: ATARAX Take 25 mg by mouth every 12 (twelve) hours as needed for itching.   Insulin Pen Needle 32G X 4 MM Misc Use 4x a day   metoprolol succinate 50 MG 24 hr tablet Commonly known as:  TOPROL-XL TAKE (1) TABLET BY MOUTH DAILY WITH FOOD *TAKE AFTER DIALYSIS* What changed:  how much to take how to take this when to take this additional instructions   OneTouch Delica Lancets 99991111 Misc Use to check blood sugar 4X daily.   OneTouch Verio test strip Generic drug: glucose blood Use as instructed to check blood sugar 4X daily.   OneTouch Verio w/Device Kit Use to check blood sugar 4X daily.   pregabalin 50 MG capsule Commonly known as: LYRICA TAKE ONE CAPSULE BY MOUTH three times a day What changed:  how much to take how to take this when to take this additional instructions   Rena-Vite Rx 1 MG Tabs Take 1 tablet by mouth daily.   sertraline 50 MG tablet Commonly known as: ZOLOFT TAKE ONE TABLET BY MOUTH EVERY DAY What changed:  medication strength how much to take   Toujeo Max SoloStar 300 UNIT/ML Solostar Pen Generic drug: insulin glargine (2 Unit Dial) Inject 80 Units into the skin daily. What changed: how much to take   [name] 500 MG chewable tablet Generic drug: sucroferric oxyhydroxide Chew 500-1,000 mg  by mouth See admin instructions. Take 1030m (2 tablets) by mouth with meals and 5054m(1 tablet) with snacks               Discharge Care Instructions  (From admission, onward)           Start     Ordered   05/07/22 0000  Discharge wound care:       Comments: Paint digit with betadine (povidone-iodine) swabstick and allow to air-dry. When dry, cover with dry gauze and secure with conform bandaging. Change daily.   05/07/22 1034            Follow-up Information     [facility]   Specialty: [facility] Services Why: Agency will contact you to set up apt times Contact information: [address] [phone]         DuNewt MinionMD Follow up in 1 week(s).   Specialty: Orthopedic Surgery Contact information: [address] [place]  [phone]              Allergies  Allergen Reactions   Ace Inhibitors Anaphylaxis and Swelling   Penicillins Itching, Swelling and Other (See Comments)    Did it involve swelling of the face/tongue/throat, SOB, or low BP? Unknown Did it involve sudden or severe rash/hives, skin peeling, or any reaction on the inside of your mouth or nose? Unknown Did you need to seek medical attention at a hospital or doctor's office? Unknown When did it last happen?      years  If all above answers are "NO", may proceed with cephalosporin use.    Statins Other (See Comments)    elevated LFT's     Albuterol Swelling    Consultations: Orthopedics Vascular surgery Nephrology   Procedures/Studies: PERIPHERAL VASCULAR CATHETERIZATION  Result Date: 05/04/2022 Images from the original result were not included.   Patient name: Kathryn Beck          MRN: [id number]      DOB: 03/1955-04-30        Sex: female  05/04/2022 Pre-operative Diagnosis: Right lower extremity critical limb ischemia with tissue loss of the first toe Post-operative diagnosis:  Same Surgeon:  JoBroadus JohnMD Procedure Performed: 1.  Ultrasound-guided micropuncture access of the left common femoral artery 2.  Second-order cannulation, right lower extremity angiogram. 3.  Drug-coated balloon angioplasty of the superficial femoral artery 5 x 100, 5 x 60 mm 4.  Drug-eluting stenting of the superficial femoral artery 6 x 1204m.  Tibial artery angioplasty-posterior tibial artery 2 x 220 mm 6.  Device assisted closure-Mynx 7.  Contrast volume 32m67m  Local anesthesia only.   Indications: Patient is a 67 y78r old female with history of right lower extremity critical limb ischemia with tissue loss of the great toe. With her comorbidities of end-stage renal disease, longstanding diabetes, she is at very high risk for amputation.  Recent ultrasound demonstrated some concern for stenosis at the level of the common femoral  artery with severe tibial disease distally.  There was also multilevel occlusive disease within the superficial femoral artery. An ultrasound was obtained today to assess the severity of stenosis at the common femoral lesion.  This lesion did not appear flow-limiting by velocity criteria.  My discussion with Shatona's children was focused on high risk endovascular intervention in an effort to recanalize the superficial femoral artery, and posterior  tibial artery to reestablish inline flow to the foot.  This is necessary for wound healing as Kiki will likely need a first toe amputation in the future.  With her arterial disease, there is a high chance of failure as many of the lesions appear to be total occlusions, especially at the level of the ankle.  There is no target for bypass surgery.  Jamirah will require superficial femoral artery stenting, balloon angioplasty of her tibial vessels.  If this is successful, I think there is a chance a toe amputation could heal.  Without inline flow, Betti would likely require below-knee amputation versus above-knee amputation.  With her current comorbidities, I do not think she would walk with either of these.  Plan for angiogram today.  Findings: No inflow disease is appreciated on last angiogram. Right lower extremity, multiple, tandem, flow-limiting stenosis throughout the superficial femoral artery, P1 segment of the popliteal artery. Popliteal artery patent.  Large peroneal artery which occludes in the mid tibia.  Anterior tibial artery is initially patent, but is atretic, occluding at the mid tibia.  The posterior tibial artery has numerous, tandem occlusions, but continues into the level of the foot.  No flow in the forefoot.             Procedure:  The patient was identified in the holding area and taken to room 8.  The patient was then placed supine on the table and prepped and draped in the usual sterile fashion.  A time out was called.  Ultrasound was used to evaluate  the left common femoral artery.  It was patent .  A digital ultrasound image was acquired.  A micropuncture needle was used to access the left common femoral artery under ultrasound guidance.  An 018 wire was advanced without resistance and a micropuncture sheath was placed.  The 018 wire was removed and a benson wire was placed.  The micropuncture sheath was exchanged for a 5 french sheath.  An omniflush catheter was advanced over the wire to the level of L-1.  Next, using the omniflush catheter and a benson wire, the aortic bifurcation was crossed and the catheter was placed into the right common femoral artery.  Runoff was obtained.  See results above.  I elected to intervene on the multilevel occlusive disease.  The patient was heparinized and a 6 [place] by 60 cm sheath was brought to the field and parked in the proximal superficial femoral artery from this location, a series of wires and catheters were used to cross the lesions in the SFA.  Initially I believe these lesions using a 5 x 100 mm drug-coated balloon.  This demonstrated significant improvement, however there was residual stenosis, therefore I elected to stent the area using a drug-eluting stent sized 6 mm x 120 mm.  This was postdilated using a 5 mm balloon.  Next, I used a drug-coated balloon-5 x 60 mm in the P1 segment of the popliteal artery for greater than 70% stenosis.  Follow-up angiography demonstrated the stenosis.  Next, attention turned to runoff.  The 6 x 60 cm sheath was hubbed, and using a series of wires and catheters I was able to cannulate the posterior tibial artery and run a wire to the level of the ankle.  There were multiple, flow-limiting stenoses versus occlusions that were crossed.  A 2 x 2 20 mm balloon was brought to the field and expanded along the entirety of the posterior tibial artery.  I difficulty distally as there  were several small occlusions.  I was unable to cross the most distal lesion with a balloon, however was  able to cross with an 014 wire.  Follow-up angiography demonstrated significant improvement, however there was only flow to the level of the midfoot.  I elected to attempt balloon angioplasty of the peroneal and anterior tibial arteries.  An 014 wire was passed down both of the vessels, however there was no outflow appreciated.  I elected not to pursue further intervention. Arteriotomy was managed with a minx device.  Impression: Successful resolution of multiple, tandem, flow-limiting stenosis throughout the SFA and above-knee popliteal artery.  Patient with severe tibial disease with very little outflow in the foot.  The posterior tibial artery was balloon angioplastied in an effort to improve distal flow, which it did, however I do not think this will provide enough perfusion to heal a toe amputation.  Pt ASA, Plavix, unable to tolerate statin.   Cassandria Santee, MD [facility] Office: [phone]    VAS [place] LOWER EXTREMITY ARTERIAL DUPLEX  Result Date: 05/01/2022 LOWER EXTREMITY ARTERIAL DUPLEX STUDY Patient Name:  Kathryn Beck  Date of Exam:   05/01/2022 Medical Rec #: [id number]        Accession #:    [id number] Date of Birth: 03-24-1955        Patient Gender: F Patient Age:   69 years Exam Location:  [facility] Procedure:      VAS [place] LOWER EXTREMITY ARTERIAL DUPLEX Referring Phys: Vonna Kotyk ROBINS --------------------------------------------------------------------------------  Indications: Ulceration, gangrene, and Follow up- evaluate right common femoral              artery plaque visualized on aortogram. High Risk Factors: Hypertension, hyperlipidemia. Other Factors: ESRD, on dialysis.  Current ABI: 0.40 RT, 0.42 LT on 04-25-2022 Comparison Study: Aortogram with lower extremity performed yesterday. Performing Technologist: Darlin Coco RDMS, RVT  Examination Guidelines: A complete evaluation includes B-mode imaging, spectral Doppler, color Doppler, and power Doppler as  needed of all accessible portions of each vessel. Bilateral testing is considered an integral part of a complete examination. Limited examinations for reoccurring indications may be performed as noted.  +----------+--------+-----+--------+--------+--------+ RIGHT     PSV cm/sRatioStenosisWaveformComments +----------+--------+-----+--------+--------+--------+ CFA Prox  134                  biphasic         +----------+--------+-----+--------+--------+--------+ CFA Mid   184                  biphasic         +----------+--------+-----+--------+--------+--------+ CFA Distal133                  biphasic         +----------+--------+-----+--------+--------+--------+ DFA       84                   biphasic         +----------+--------+-----+--------+--------+--------+ SFA Prox  98                   biphasic         +----------+--------+-----+--------+--------+--------+  Summary: See table(s) above for measurements and observations. Electronically signed by Orlie Pollen on 05/01/2022 at 5:19:00 PM.    Final    PERIPHERAL VASCULAR CATHETERIZATION  Result Date: 05/01/2022 DATE OF SERVICE: 04/30/2022  PATIENT:  Kathryn Beck  67 y.o. 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  PATIENT DISPOSITION:  PACU - hemodynamically stable.  Delay start of Pharmacological VTE agent (>24hrs) due to surgical blood loss or risk of bleeding: no  INDICATION FOR PROCEDURE: BALANY SHIMABUKUROis a 67y.o. female with right  great toe gangrene. After careful discussion of risks, benefits, and alternatives the patient was offered angiography. The patient understood and wished to proceed.  OPERATIVE FINDINGS: Patient developed respiratory failure and required rescue to improve oxygenation and ventilation. Ultimately reversed sedation, and placed oral airway with good result.  Terminal aorta and iliac arteries: Widely patent  Right lower extremity: Common femoral artery: Focal plaque causing flow limiting stenosis Profunda femoris artery: diseased, but patent Superficial femoral artery: diffusely diseased. Multifocal stenosis - greatest 90%. Popliteal artery: diffusely diseased but patent Anterior tibial artery: diffusely diseased, occluded proximally. Reconstitutes distally, but heavily diseased. Tibioperoneal trunk: widely patent Peroneal artery: widely patent proximally, but does not opacify in mid leg. Posterior tibial artery: small, but patent proximally. Diffuse disease. Critical disease in distal 1/3rd. Pedal circulation: fills via reconstituted AT and PT  GLASS score. FP 4. IP 4. Stage III.  WIfI score. 2 / 3 / 0. Stage IV.  DESCRIPTION OF PROCEDURE: After identification of the patient in the pre-operative holding area, the patient was transferred to the operating room. The patient was positioned supine on the operating room table. Anesthesia was induced. The groins was prepped and draped in standard fashion. A surgical pause was performed confirming correct patient, procedure, and operative location.  The left groin was anesthetized with subcutaneous injection of 1% lidocaine. Using ultrasound guidance, the left common femoral artery was accessed with micropuncture technique. Fluoroscopy was used to confirm cannulation over the femoral head. The 70F sheath was upsized to 59F.  Shortly after the case began, the patient suffered respiratory failure.  She maintained a pulse throughout the case.  Her oxygen saturation dropped.  Sedation  was reversed.  Patient had improvement in oxygenation and ventilation.  A Benson wire was advanced into the distal aorta. Over the wire an omni flush catheter was advanced to the level of L2. Aortogram was performed - see above for details.  The right common iliac artery was selected with an omniflush catheter and Bentson guidewire. The wire was advanced into the common femoral artery. Over the wire the omni flush catheter was advanced into the external iliac artery. Selective angiography was performed - see above for details.  The sheath was left in place to be removed in the recovery area.  Conscious sedation was administered with the use of IV fentanyl and midazolam under continuous physician and nurse monitoring.  Heart rate, blood pressure, and oxygen saturation were continuously monitored.  Total sedation time was 23 minutes  Upon completion of the case instrument and sharps counts were confirmed correct. The patient was transferred to the PACU in good condition. I was present for all portions of the procedure.  PLAN: Aspirin 81 mg by mouth daily.  High intensity statin therapy.  Will discuss revascularization options with her tomorrow.  TYevonne Aline HStanford Breed MD [facility] Phone Number: [phone] 04/30/2022 3:50 PM   VAS [place] ABI WITH/WO TBI  Result Date: 04/25/2022  LOWER EXTREMITY DOPPLER STUDY Patient Name:  Kathryn Beck  Date of Exam:   04/25/2022 Medical Rec #: [id number]        Accession #:    [id number] Date of Birth: 05/18/1955        Patient Gender: F Patient Age:   72 years Exam Location:  [facility] Procedure:      VAS [place] ABI WITH/WO TBI Referring Phys: Jennette Kettle --------------------------------------------------------------------------------  Indications: Ulceration, and gangrene. High Risk Factors: Hypertension, hyperlipidemia, Diabetes. Other Factors: ESRD, on dialysis.  Comparison Study: No prior study on file Performing Technologist: Sharion Dove RVS  Examination Guidelines: A complete evaluation includes at minimum, Doppler waveform signals and systolic blood pressure reading at the level of bilateral brachial, anterior tibial, and posterior tibial arteries, when vessel segments are accessible. Bilateral testing is considered an integral part of a complete examination. Photoelectric Plethysmograph (PPG) waveforms and toe systolic pressure readings are included as required and additional duplex testing as needed. Limited examinations for reoccurring indications may be performed as noted.  ABI Findings: +---------+------------------+-----+-------------------+--------+ Right    Rt Pressure (mmHg)IndexWaveform           Comment  +---------+------------------+-----+-------------------+--------+ Brachial 135                                                +---------+------------------+-----+-------------------+--------+ PTA      37                0.27 dampened monophasic         +---------+------------------+-----+-------------------+--------+ DP       54                0.40 dampened monophasic         +---------+------------------+-----+-------------------+--------+ Great Toe                       Absent                      +---------+------------------+-----+-------------------+--------+ +---------+------------------+-----+-------------------+---------------------+ Left     Lt Pressure (mmHg)IndexWaveform           Comment               +---------+------------------+-----+-------------------+---------------------+ Brachial                                           restricted (dialysis) +---------+------------------+-----+-------------------+---------------------+ PTA      40                0.30 dampened monophasic                      +---------+------------------+-----+-------------------+---------------------+ DP       57                0.42 dampened monophasic                       +---------+------------------+-----+-------------------+---------------------+ Great Toe                       Absent                                   +---------+------------------+-----+-------------------+---------------------+ +-------+-----------+-----------+------------+------------+  ABI/TBIToday's ABIToday's TBIPrevious ABIPrevious TBI +-------+-----------+-----------+------------+------------+ Right  0.40       absent                              +-------+-----------+-----------+------------+------------+ Left   0.42       absent                              +-------+-----------+-----------+------------+------------+  Summary: Right: Resting right ankle-brachial index indicates severe right lower extremity arterial disease. The right toe-brachial index is abnormal. Left: Resting left ankle-brachial index indicates severe left lower extremity arterial disease. The left toe-brachial index is abnormal. *See table(s) above for measurements and observations.  Electronically signed by Orlie Pollen on 04/25/2022 at 1:23:59 PM.    Final    DG Chest Port 1 View  Result Date: 04/15/2022 CLINICAL DATA:  Shortness of breath. EXAM: PORTABLE CHEST 1 VIEW COMPARISON:  Chest radiograph dated 09/05/2021. FINDINGS: Mild cardiomegaly with mild vascular congestion. Chronic scarring in the lingula. No pleural effusion or pneumothorax. Atherosclerotic calcification of the aorta. No acute osseous pathology. IMPRESSION: Mild cardiomegaly with mild vascular congestion. Electronically Signed   By: Anner Crete M.D.   On: 04/15/2022 02:04   (Echo, Carotid, EGD, Colonoscopy, ERCP)    Subjective: Patient seen in the morning rounds.  She was walking to the bathroom with minimal help.  Denied any complaints.  Pain is controlled.  Husband at the bedside.  Uneventful dialysis yesterday.   Discharge Exam: Vitals:   05/06/22 1950 05/07/22 0300  BP: (!) 124/44 (!) 96/46  Pulse: 75 72  Resp: 18 18   Temp: 98.9 F (37.2 C) 98.4 F (36.9 C)  SpO2: 98% 96%   Vitals:   05/06/22 1455 05/06/22 1950 05/07/22 0300 05/07/22 0346  BP: (!) 123/54 (!) 124/44 (!) 96/46   Pulse: 70 75 72   Resp: 10 18 18   $ Temp: 97.6 F (36.4 C) 98.9 F (37.2 C) 98.4 F (36.9 C)   TempSrc: Oral Oral Oral   SpO2: 100% 98% 96%   Weight: 76.8 kg   77 kg  Height:        General: Pt is alert, awake, not in acute distress Cardiovascular: RRR, S1/S2 +, no rubs, no gallops Respiratory: CTA bilaterally, no wheezing, no rhonchi Abdominal: Soft, NT, ND, bowel sounds + Extremities:  Right great toe with dry gangrene.  No palpable pedal pulses. Left upper extremity with AV fistula.   The results of significant diagnostics from this hospitalization (including imaging, microbiology, ancillary and laboratory) are listed below for reference.     Microbiology: No results found for this or any previous visit (from the past 240 hour(s)).   Labs: BNP (last 3 results) Recent Labs    07/06/21 0936  BNP [id number]*   Basic Metabolic Panel: Recent Labs  Lab 05/03/22 0049 05/04/22 0115 05/05/22 0037 05/06/22 0037 05/07/22 0101  NA 136 138 136 136 136  K 3.7 3.9 3.7 3.6 3.6  CL 97* 97* 95* 94* 94*  CO2 24 25 24 26 27  $ GLUCOSE 180* 258* 200* 263* 183*  BUN 52* 63* 35* 51* 25*  CREATININE 7.00* 8.91* 6.30* 8.11* 5.19*  CALCIUM 8.8* 8.9 8.8* 9.2 9.2  PHOS 7.1* 7.8* 6.5* 8.4* 4.9*   Liver Function Tests: Recent Labs  Lab 05/03/22 0049 05/04/22 0115 05/05/22 0037 05/06/22 0037 05/07/22 0101  ALBUMIN 2.7* 2.7* 2.7* 2.7* 2.8*  No results for input(s): "LIPASE", "AMYLASE" in the last 168 hours. No results for input(s): "AMMONIA" in the last 168 hours. CBC: Recent Labs  Lab 05/01/22 0825 05/04/22 0115 05/06/22 1224  WBC 9.6 8.7 10.7*  HGB 10.9* 10.7* 10.1*  HCT 34.6* 35.1* 32.0*  MCV 78.6* 80.0 79.4*  PLT 218 213 243   Cardiac Enzymes: No results for input(s): "CKTOTAL", "CKMB", "CKMBINDEX",  "TROPONINI" in the last 168 hours. BNP: Invalid input(s): "POCBNP" CBG: Recent Labs  Lab 05/05/22 2108 05/06/22 0601 05/06/22 1643 05/06/22 2136 05/07/22 0642  GLUCAP 248* 237* 112* 180* 143*   D-Dimer No results for input(s): "DDIMER" in the last 72 hours. Hgb A1c No results for input(s): "HGBA1C" in the last 72 hours. Lipid Profile No results for input(s): "CHOL", "HDL", "LDLCALC", "TRIG", "CHOLHDL", "LDLDIRECT" in the last 72 hours. Thyroid function studies No results for input(s): "TSH", "T4TOTAL", "T3FREE", "THYROIDAB" in the last 72 hours.  Invalid input(s): "FREET3" Anemia work up No results for input(s): "VITAMINB12", "FOLATE", "FERRITIN", "TIBC", "IRON", "RETICCTPCT" in the last 72 hours. Urinalysis    Component Value Date/Time   COLORURINE YELLOW 11/07/2017 2340   APPEARANCEUR HAZY (A) 11/07/2017 2340   LABSPEC 1.017 11/07/2017 2340   PHURINE 5.0 11/07/2017 2340   GLUCOSEU 150 (A) 11/07/2017 2340   HGBUR NEGATIVE 11/07/2017 2340   BILIRUBINUR NEGATIVE 11/07/2017 2340   BILIRUBINUR neg 02/15/2017 0913   KETONESUR NEGATIVE 11/07/2017 2340   PROTEINUR >=300 (A) 11/07/2017 2340   UROBILINOGEN 0.2 02/15/2017 0913   NITRITE NEGATIVE 11/07/2017 2340   LEUKOCYTESUR NEGATIVE 11/07/2017 2340   Sepsis Labs Recent Labs  Lab 05/01/22 0825 05/04/22 0115 05/06/22 1224  WBC 9.6 8.7 10.7*   Microbiology No results found for this or any previous visit (from the past 240 hour(s)).   Time coordinating discharge: 35 minutes  SIGNED:   Barb Merino, MD  [facility] 05/07/2022, 10:35 AM

## 2022-05-07 NOTE — Progress Notes (Addendum)
Discharge instructions reviewed with pt and daughter via phone, and friend at bedside. Wound care discussed with pt, betadine and dsgs provided.  Copy of instructions given to pt. Pt informed scripts sent to her 2 pharmacies for pick up. Pt's lunch just arrived, she is getting dressed, going to eat a little, husband on his way to pick up pt. Pt can be taken to the discharge lounge.

## 2022-05-07 NOTE — Progress Notes (Signed)
D/C order noted. Contacted [facility] and spoke to [place]. Clinic advised pt will d/c today and should resume care tomorrow. D/C summary and last renal note faxed to clinic for continuation of care.   Melven Sartorius Renal Navigator [phone]

## 2022-05-08 ENCOUNTER — Other Ambulatory Visit: Payer: Self-pay

## 2022-05-08 ENCOUNTER — Telehealth (HOSPITAL_BASED_OUTPATIENT_CLINIC_OR_DEPARTMENT_OTHER): Payer: Self-pay | Admitting: Family

## 2022-05-08 ENCOUNTER — Telehealth: Payer: Self-pay

## 2022-05-08 ENCOUNTER — Telehealth: Payer: Self-pay | Admitting: Family Medicine

## 2022-05-08 DIAGNOSIS — Z992 Dependence on renal dialysis: Secondary | ICD-10-CM | POA: Diagnosis not present

## 2022-05-08 DIAGNOSIS — N2581 Secondary hyperparathyroidism of renal origin: Secondary | ICD-10-CM | POA: Diagnosis not present

## 2022-05-08 DIAGNOSIS — N186 End stage renal disease: Secondary | ICD-10-CM | POA: Diagnosis not present

## 2022-05-08 MED ORDER — ONDANSETRON HCL 4 MG PO TABS: 4.0000 mg | ORAL_TABLET | Freq: Three times a day (TID) | ORAL | 0 refills | Status: DC | PRN

## 2022-05-08 NOTE — Telephone Encounter (Signed)
Pt daughter called requesting a sliding scale for when her mom has high blood sugars. Pt advised her mom has been getting confused and she wants to make sure she is not over correcting when administering her insulin. Requested a Mychart message with sliding scale.

## 2022-05-08 NOTE — Telephone Encounter (Signed)
T, We can use this sliding scale for now -we may need to adjust this in the future, but for now lets try this:  - 141-160: + 2 units  - 161-180: + 4 units  - 181-200: + 6 units  - 201-240: + 8 units  - 241-280: + 10 units  - >281: + 12 units   Ty! C

## 2022-05-08 NOTE — Transitions of Care (Post Inpatient/ED Visit) (Signed)
   05/08/2022  Name: Kathryn Beck MRN: [id number] DOB: 08/14/1955  Today's TOC FU Call Status: Today's TOC FU Call Status:: Unsuccessul Call (1st Attempt) Unsuccessful Call (1st Attempt) Date: 05/08/22  Attempted to reach the patient regarding the most recent Inpatient/ED visit.  Follow Up Plan: Additional outreach attempts will be made to reach the patient to complete the Transitions of Care (Post Inpatient/ED visit) call.   Johnney Killian, RN, BSN, CCM Care Management Coordinator [place]/[facility] Phone: [phone]: [phone]

## 2022-05-08 NOTE — Telephone Encounter (Signed)
Prescription Request  05/08/2022  Is this a "Controlled Substance" medicine? No  LOV: 04/21/2022  What is the name of the medication or equipment? ondansetron (ZOFRAN) injection 4 mg   Have you contacted your pharmacy to request a refill? No   Which pharmacy would you like this sent to?  [id number] [place] [id number] [address]Phone: [phone]: [address]Phone: [phone]: [phone]   Patient notified that their request is being sent to the clinical staff for review and that they should receive a response within 2 business days.   Please advise at Mobile [phone](mobile)

## 2022-05-08 NOTE — Telephone Encounter (Signed)
Refills sent

## 2022-05-08 NOTE — Telephone Encounter (Signed)
Sliding scale sent via Mychart message.

## 2022-05-08 NOTE — Telephone Encounter (Signed)
Left message for patient to call and discuss rescheduling the Echocardiogram and Caroid doppler ordered by Laurann Montana, NP

## 2022-05-11 ENCOUNTER — Telehealth: Payer: Self-pay | Admitting: *Deleted

## 2022-05-11 ENCOUNTER — Other Ambulatory Visit: Payer: Self-pay

## 2022-05-11 ENCOUNTER — Telehealth: Payer: Self-pay

## 2022-05-11 ENCOUNTER — Encounter: Payer: Self-pay | Admitting: Internal Medicine

## 2022-05-11 ENCOUNTER — Ambulatory Visit (INDEPENDENT_AMBULATORY_CARE_PROVIDER_SITE_OTHER): Payer: Medicare HMO | Admitting: Internal Medicine

## 2022-05-11 VITALS — BP 105/67 | HR 82 | Ht 64.0 in

## 2022-05-11 DIAGNOSIS — Z992 Dependence on renal dialysis: Secondary | ICD-10-CM | POA: Diagnosis not present

## 2022-05-11 DIAGNOSIS — I10 Essential (primary) hypertension: Secondary | ICD-10-CM

## 2022-05-11 DIAGNOSIS — N186 End stage renal disease: Secondary | ICD-10-CM | POA: Diagnosis not present

## 2022-05-11 DIAGNOSIS — G4733 Obstructive sleep apnea (adult) (pediatric): Secondary | ICD-10-CM

## 2022-05-11 DIAGNOSIS — N2581 Secondary hyperparathyroidism of renal origin: Secondary | ICD-10-CM | POA: Diagnosis not present

## 2022-05-11 NOTE — Progress Notes (Unsigned)
This encounter was created in error - please disregard.

## 2022-05-11 NOTE — Progress Notes (Unsigned)
   HPI:[name] is a 67 y.o. female who presents for evaluation of Follow-up (Bp dropping daughter reports it is running lower since d/c from hospital family called ems last night but bp came up and they decided to keep her home.a lot of worrying over toe amputation coming up ) . For the details of today's visit, please refer to the assessment and plan.  Physical Exam: Vitals:   05/11/22 1334  BP: 105/67  Pulse: 82  SpO2: 96%  Height: 5' 4"$  (1.626 m)     Physical Exam   Assessment & Plan:   There are no diagnoses linked to this encounter.    Lorene Dy, MD

## 2022-05-11 NOTE — Telephone Encounter (Signed)
Contacted regarding PREP Class to begin on 05/26/2022. Patient stated she was unsure if she would be able to get to class. She would like to be called back next month to see if she can attend the class beginning 06/23/2022.

## 2022-05-11 NOTE — Patient Instructions (Signed)
Thank you, Ms.Waldon Reining for allowing [place] to provide your care today.   Stop taking amlodipine, clonidine, and Imdur.  Continue metoprolol.  Check your blood pressure twice daily until you follow-up with Dr. Moshe Cipro.  She can make further adjustments on your blood pressure medication at this time.     Tamsen Snider, M.D.

## 2022-05-11 NOTE — Transitions of Care (Post Inpatient/ED Visit) (Signed)
   05/11/2022  Name: Kathryn Beck MRN: [id number] DOB: 01-Jan-1956  Today's TOC FU Call Status: Today's TOC FU Call Status:: Unsuccessful Call (2nd Attempt) Unsuccessful Call (2nd Attempt) Date: 05/11/22  Attempted to reach the patient regarding the most recent Inpatient/ED visit.  Follow Up Plan: Additional outreach attempts will be made to reach the patient to complete the Transitions of Care (Post Inpatient/ED visit) call.   Johnney Killian, RN, BSN, CCM Care Management Coordinator /[facility] Phone: [phone]: [phone]

## 2022-05-11 NOTE — Telephone Encounter (Signed)
Patient referred and mychart msg sent to patient's daughter letting her know.

## 2022-05-11 NOTE — Telephone Encounter (Signed)
Patients daughter was with her at her appt today to see Dr Court Joy she is requesting a referral from you to pulmonary so the patient can get a new cpap machine, over night sleep study needed ok to place referral ? Patient is due to follow up with you next week

## 2022-05-11 NOTE — Progress Notes (Signed)
  Care Coordination   Note   05/11/2022 Name: Kathryn Beck MRN: [id number] DOB: 1956/01/28  Kathryn Beck is a 67 y.o. year old female who sees Fayrene Helper, MD for primary care. I reached out to Waldon Reining by phone today to offer care coordination services.  [name] was given information about Care Coordination services today including:   The Care Coordination services include support from the care team which includes your Nurse Coordinator, Clinical Social Worker, or Pharmacist.  The Care Coordination team is here to help remove barriers to the health concerns and goals most important to you. Care Coordination services are voluntary, and the patient may decline or stop services at any time by request to their care team member.   Care Coordination Consent Status: Patient agreed to services and verbal consent obtained.   Follow up plan:  Telephone appointment with care coordination team member scheduled for:  05/12/22  Encounter Outcome:  Pt. Scheduled  [place]  Direct Dial: [phone]

## 2022-05-12 ENCOUNTER — Other Ambulatory Visit: Payer: Self-pay | Admitting: Internal Medicine

## 2022-05-12 ENCOUNTER — Ambulatory Visit: Payer: Self-pay | Admitting: *Deleted

## 2022-05-12 ENCOUNTER — Encounter: Payer: Self-pay | Admitting: *Deleted

## 2022-05-12 ENCOUNTER — Telehealth: Payer: Self-pay

## 2022-05-12 ENCOUNTER — Ambulatory Visit (INDEPENDENT_AMBULATORY_CARE_PROVIDER_SITE_OTHER): Payer: Medicare HMO | Admitting: Orthopedic Surgery

## 2022-05-12 ENCOUNTER — Encounter: Payer: Medicare HMO | Admitting: Internal Medicine

## 2022-05-12 DIAGNOSIS — I96 Gangrene, not elsewhere classified: Secondary | ICD-10-CM | POA: Diagnosis not present

## 2022-05-12 NOTE — Assessment & Plan Note (Addendum)
Patient's blood pressure today is 105/67. Recently admitted to hospital with hypotension. 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 At discharge patient was recommended to stop amlodipine,imdur, and clonidine. She has bubble packed medications and she has continued these medications. No episodes of near syncope. Patient still makes urine and on Lasix 80 mg   Plan - Stop amlodipine, clonidine, and Imdur. Patient's daughter will contact pharmacy to have bubble pack updated.  - Recommended following up with her nephrologist before discontinuing Lasix 80 mg BID - Continue Metoprolol Succinate 50 mg daily - Follow up with PCP in one week to see if BP remains controlled with stopping these medications.

## 2022-05-12 NOTE — Patient Instructions (Signed)
Visit Information  Thank you for taking time to visit with me today. Please don't hesitate to contact me if I can be of assistance to you.   Following are the goals we discussed today:   Goals Addressed             This Visit's Progress    Receive Assistance Applying for Aid & Attendance Benefits, through [employer].   On track    Care Coordination Interventions:  Interventions Today    Flowsheet Row Most Recent Value  Chronic Disease   Chronic disease during today's visit Diabetes, Hypertension (HTN), Chronic Kidney Disease/End Stage Renal Disease (ESRD), Congestive Heart Failure (CHF), Other  [Neuropathy, Chronic Pain Syndrome, Anxiety, Depression, Memory Loss, Neurocognitive Disorder & Requires Maximum Assistance with Activities of Daily Living]  General Interventions   General Interventions Discussed/Reviewed General Interventions Reviewed, Annual Eye Exam, General Interventions Discussed, Labs, Annual Foot Exam, Lipid Profile, Durable Medical Equipment (DME), Vaccines, Health Screening, [employer], Doctor Visits, Communication with, Level of Care  [Primary Care Provider]  Labs Hgb A1c every 3 months, Kidney Function  Vaccines COVID-19, Flu, Pneumonia, RSV, Shingles, Tetanus/Pertussis/Diphtheria  [Encouraged]  Doctor Visits Discussed/Reviewed Doctor Visits Discussed, Doctor Visits Reviewed, Annual Wellness Visits, PCP, Specialist  [Encouraged]  Health Screening Colonoscopy, Mammogram  [Encouraged]  Durable Medical Equipment (DME) BP Cuff, Glucomoter, Environmental consultant, Wheelchair, Estate agent  PCP/Specialist Visits Compliance with follow-up visit  Communication with PCP/Specialists, RN  Level of Care Adult Daycare, Location manager, Assisted Living, [place]  Applications Medicaid, Personal Care Services  Exercise Interventions   Exercise Discussed/Reviewed Exercise Discussed, Exercise Reviewed, Physical  Activity, Assistive device use and maintanence  Physical Activity Discussed/Reviewed Physical Activity Discussed, Physical Activity Reviewed, Types of exercise, Home Exercise Program (HEP)  [Encouraged]  Education Interventions   Education Provided Provided Engineer, site, Provided Web-based Education, Provided Education  Provided Verbal Education On Nutrition, Foot Care, Eye Care, Labs, Blood Sugar Monitoring, Mental Health/Coping with Illness, Applications, Exercise, Medication, When to see the doctor, [employer], Engineer, materials, [place] Discussed, Mental Health Reviewed, Coping Strategies, Crisis, Anxiety, Depression, Grief and Loss, Substance Abuse, Suicide  Nutrition Interventions   Nutrition Discussed/Reviewed Nutrition Discussed, Nutrition Reviewed, Adding fruits and vegetables, Fluid intake, Decreasing sugar intake, Increaing proteins, Decreasing fats, Decreasing salt  Pharmacy Interventions   Pharmacy Dicussed/Reviewed Pharmacy Topics Discussed, Pharmacy Topics Reviewed, Medication Adherence, Affording Medications, Referral to Pharmacist  Medication Adherence Unable to refill medication  Referral to Pharmacist Cannot afford medications  Safety Interventions   Safety Discussed/Reviewed Safety Discussed, Safety Reviewed, Fall Risk, Home Safety  Home Safety Assistive Devices, Need for home safety assessment, Refer for home visit, Contact provider for referral to PT/OT, Refer for community resources, Contact home health agency  Advanced Directive Interventions   Advanced Directives Discussed/Reviewed Advanced Directives Discussed, Advanced Directives Reviewed, Advanced Care Planning, Provided resource for acquiring and filling out documents     Assessed Social Determinant of Health Barriers. Discussed Plans for Ongoing Care Management Follow Up. Provided Health visitor Information for Care Management Team Members. Screened for Signs & Symptoms of Depression, Related to Chronic Disease State.  PHQ2 & PHQ9 Depression Screening Tool Completed & Results Reviewed.  Suicidal Ideation & Homicidal Ideation Assessed - None Present.   Access to Weapons Assessed - None Present. Victim & Exposure to Domestic Violence Assessed - None Present. Crisis Information, Agencies, Services & Resources Provided.  Active Listening & Reflection Utilized.  Emotional Support Provided. Verbalization of Feelings Encouraged.  Caregiver Stress Acknowledged. Symptoms of Caregiver Burnout & Stress Validated. Caregiver Resources Reviewed. Caregiver Support Groups Offered. Self-Enrollment in Caregiver Support Group of Interest Emphasized. Solution-Focused Strategies Developed. Problem Solving Interventions Identified. Task-Centered Solutions Implemented.  Brief Cognitive Behavioral Therapy Performed.  Client-Centered Therapy Initiated. Acceptance & Commitment Therapy Introduced. Reviewed Prescription Medications & Discussed Importance of Compliance. Quality of Sleep Assessed & Sleep Hygiene Techniques Promoted. Discussed Higher Level of Care Options ([place], [place]) with Daughter, [name] & Encouraged Consideration. Verified No In-Home Care Services, [employer], Building control surveyor, Etc., Covered Under Current Health Net, through [place] Medicare Holiday representative).  Verified Patient & Spouse Combined Monthly Social Security Income Exceeds Poverty Guidelines for the [place] of [place] for [employer] 2024. Verified No Long-Term Care Insurance Benefits, [name], Plans, Etc.  Confirmed Husband, [name] is Retired English as a second language teacher, Encouraging Daughter, [name] to Assist with Completion of Application for Aid &  Attendance Benefits, through [employer]. CSW Collaboration with Daughter, [name] to Email ([email or web address]) & Confirm Receipt, of The Following List of Information, Instructions & Applications to Apply for Aid & Attendance Benefits, through [employer]: ~ How to Apply for Aid & Attendance Benefits Online through [employer]         ~ [place]          ~ Tour manager to [place]          ~ [place]         ~ Dispensing optician in Music therapist - Emergency planning/management officer with Daughter, [name] to [employer] [phone]). CSW Collaboration with Primary Care Provider, Dr. Tula Nakayama with [facility] [phone]), Via Secure Chat Message in [place], to Request Order for [place], Per Daughter, [name] Request. CSW Collaboration with Pharmacy Department with [facility] [phone]), Via Secure Chat Message in [place], to Request Assistance with Obtaining Affordable Prescription Medications, Per Daughter, [name] Request.      Our next appointment is by telephone on 05/20/2022 at 3:15 pm.  Please call the care guide team at [phone] if you need to cancel or reschedule your appointment.   If you are experiencing a Mental Health or [place] or need someone to talk to, please call the Suicide and Crisis Lifeline: 988 call the [place] National Suicide Prevention Lifeline: 9085096500 or TTY: 585-085-0593 TTY 765-804-7144) to talk to a trained counselor call 1-800-273-TALK (toll free, 24 hour hotline) go to [facility] [address] [place] [phone]) call the [place]: [phone] call 911  Patient verbalizes understanding of instructions and care plan provided today and agrees to view in [place]. Active MyChart status and patient understanding of how to access instructions and care plan via MyChart confirmed with patient.     Telephone follow up appointment with care management team member scheduled for:  05/20/2022 at 3:15 pm.  Nat Christen, BSW, MSW, [place]  Licensed Clinical Social Worker  [place]  Mailing [place]. [address] [place], [place] [address] Physical Address-[address], [place], [place] [address] Toll Free  Main # [phone] Fax # [phone] Cell # [phone] [email or web address]

## 2022-05-12 NOTE — Patient Outreach (Signed)
Care Coordination   Initial Visit Note   05/12/2022  Name: Kathryn Beck MRN: [id number] DOB: 07-04-1955  Kathryn Beck is a 67 y.o. year old female who sees Fayrene Helper, MD for primary care. I spoke with daughter, [name] by phone today.  What matters to the patients health and wellness today?   Receive Assistance Applying for Aid & Attendance Benefits, through [employer].   Goals Addressed             This Visit's Progress    Receive Assistance Applying for Aid & Attendance Benefits, through [employer].   On track    Care Coordination Interventions:  Interventions Today    Flowsheet Row Most Recent Value  Chronic Disease   Chronic disease during today's visit Diabetes, Hypertension (HTN), Chronic Kidney Disease/End Stage Renal Disease (ESRD), Congestive Heart Failure (CHF), Other  [Neuropathy, Chronic Pain Syndrome, Anxiety, Depression, Memory Loss, Neurocognitive Disorder & Requires Maximum Assistance with Activities of Daily Living]  General Interventions   General Interventions Discussed/Reviewed General Interventions Reviewed, Annual Eye Exam, General Interventions Discussed, Labs, Annual Foot Exam, Lipid Profile, Durable Medical Equipment (DME), Vaccines, Health Screening, [employer], Doctor Visits, Communication with, Level of Care  [Primary Care Provider]  Labs Hgb A1c every 3 months, Kidney Function  Vaccines COVID-19, Flu, Pneumonia, RSV, Shingles, Tetanus/Pertussis/Diphtheria  [Encouraged]  Doctor Visits Discussed/Reviewed Doctor Visits Discussed, Doctor Visits Reviewed, Annual Wellness Visits, PCP, Specialist  [Encouraged]  Health Screening Colonoscopy, Mammogram  [Encouraged]  Durable Medical Equipment (DME) BP Cuff, Glucomoter, Environmental consultant, Wheelchair, Estate agent  PCP/Specialist Visits Compliance with follow-up visit  Communication with PCP/Specialists, RN  Level of Care Adult Daycare,  Location manager, Assisted Living, [place]  Applications Medicaid, Personal Care Services  Exercise Interventions   Exercise Discussed/Reviewed Exercise Discussed, Exercise Reviewed, Physical Activity, Assistive device use and maintanence  Physical Activity Discussed/Reviewed Physical Activity Discussed, Physical Activity Reviewed, Types of exercise, Home Exercise Program (HEP)  [Encouraged]  Education Interventions   Education Provided Provided Engineer, site, Provided Web-based Education, Provided Education  Provided Verbal Education On Nutrition, Foot Care, Eye Care, Labs, Blood Sugar Monitoring, Mental Health/Coping with Illness, Applications, Exercise, Medication, When to see the doctor, [employer], Engineer, materials, [place] Discussed, Mental Health Reviewed, Coping Strategies, Crisis, Anxiety, Depression, Grief and Loss, Substance Abuse, Suicide  Nutrition Interventions   Nutrition Discussed/Reviewed Nutrition Discussed, Nutrition Reviewed, Adding fruits and vegetables, Fluid intake, Decreasing sugar intake, Increaing proteins, Decreasing fats, Decreasing salt  Pharmacy Interventions   Pharmacy Dicussed/Reviewed Pharmacy Topics Discussed, Pharmacy Topics Reviewed, Medication Adherence, Affording Medications, Referral to Pharmacist  Medication Adherence Unable to refill medication  Referral to Pharmacist Cannot afford medications  Safety Interventions   Safety Discussed/Reviewed Safety Discussed, Safety Reviewed, Fall Risk, Home Safety  Home Safety Assistive Devices, Need for home safety assessment, Refer for home visit, Contact provider for referral to PT/OT, Refer for community resources, Contact home health agency  Advanced Directive Interventions   Advanced Directives Discussed/Reviewed Advanced Directives Discussed, Advanced  Directives Reviewed, Advanced Care Planning, Provided resource for acquiring and filling out documents     Assessed Social Determinant of Health Barriers. Discussed Plans for Ongoing Care Management Follow Up. Provided Tree surgeon Information for Care Management Team Members. Screened for Signs & Symptoms of Depression, Related to Chronic Disease State.  PHQ2 & PHQ9 Depression Screening Tool Completed & Results Reviewed.  Suicidal Ideation & Homicidal Ideation Assessed - None Present.   Access to Weapons Assessed - None Present. Victim & Exposure to Domestic Violence Assessed - None Present. Crisis Information, Agencies, Services & Resources Provided.    Active Listening & Reflection Utilized.  Emotional Support Provided. Verbalization of Feelings Encouraged.  Caregiver Stress Acknowledged. Symptoms of Caregiver Burnout & Stress Validated. Caregiver Resources Reviewed. Caregiver Support Groups Offered. Self-Enrollment in Caregiver Support Group of Interest Emphasized. Solution-Focused Strategies Developed. Problem Solving Interventions Identified. Task-Centered Solutions Implemented.  Brief Cognitive Behavioral Therapy Performed.  Client-Centered Therapy Initiated. Acceptance & Commitment Therapy Introduced. Reviewed Prescription Medications & Discussed Importance of Compliance. Quality of Sleep Assessed & Sleep Hygiene Techniques Promoted. Discussed Higher Level of Care Options ([place], [place]) with Daughter, [name] & Encouraged Consideration. Verified No In-Home Care Services, [employer], Building control surveyor, Etc., Covered Under Current Health Net, through [place] Medicare Holiday representative).  Verified Patient & Spouse Combined Monthly Social Security Income Exceeds Poverty Guidelines for the [place] of [place] for [employer]  2024. Verified No Long-Term Care Insurance Benefits, [name], Plans, Etc.  Confirmed Husband, Kathryn Beck is Retired English as a second language teacher, Encouraging Daughter, [name] to Assist with Completion of Application for Aid & Attendance Benefits, through [employer]. CSW Collaboration with Daughter, [name] to Email ([email or web address]) & Confirm Receipt, of The Following List of Information, Instructions & Applications to Apply for Aid & Attendance Benefits, through [employer]:  ~ How to Apply for Aid & Attendance Benefits Online through [employer]           ~ [place]            ~ Tour manager to [place]            ~ [place]           ~ Dispensing optician in Music therapist - Emergency planning/management officer with Daughter, [name] to [employer] [phone]). CSW Collaboration with Primary Care Provider, Dr. Tula Nakayama with [facility] [phone]), Via Secure Chat Message in [place], to Request Order for [place], Per Daughter, [name] Request. CSW Collaboration with Pharmacy Department with [facility] [phone]), Via Secure Chat Message in [place], to Request Assistance with Obtaining Affordable Prescription Medications, Per Daughter, [name] Request.        SDOH assessments and interventions completed:  Yes.  SDOH Interventions Today    Flowsheet Row Most Recent Value  SDOH Interventions   Food Insecurity Interventions Intervention Not Indicated  [Verified by Daughter, [place] Interventions Intervention Not Indicated  [Verified by Daughter,  [name] [place]]  Transportation Interventions Intervention Not Indicated, Patient Resources (Friends/Family), Payor Benefit  [Verified by Daughter, Catering manager [place]]  Utilities Interventions Intervention Not Indicated  [Verified by Daughter, [name] [place]]  Alcohol Usage Interventions Intervention Not Indicated (Score <7)  [Verified by Daughter, [name] [place]]  Depression Interventions/Treatment  Medication, Counseling, Patient refuses Treatment  [Verified by Daughter, [name] [place]]  Financial Strain Interventions Development worker, community, Other (Comment)  [Verified by Daughter, [name]  Referral to Pharmacy Department for Medication Assistance Programs & Provided Financial Resources]  Physical Activity Interventions Patient Refused  [Verified by Daughter, [name] [place]]  Stress Interventions Intervention Not Indicated  [Verified by Daughter, [name] [place]]  Social Connections Interventions Intervention Not Indicated  [Verified by Daughter, [name] [place]]     Care Coordination Interventions:  Yes, provided.  Follow up plan: Follow up call scheduled for 05/20/2022 at 3:15 pm.  Encounter Outcome:  Pt. Visit Completed.   Nat Christen, BSW, MSW, LCSW  Licensed Education officer, [facility]  Mailing [place] [address] [place], [place] [address] Physical Address-[address] [place], [place] [address] Toll Free Main # [phone] Fax # [phone] Cell # [phone] [email or web address]

## 2022-05-12 NOTE — Transitions of Care (Post Inpatient/ED Visit) (Signed)
   05/12/2022  Name: Kathryn Beck MRN: [id number] DOB: 05-Aug-1955  Today's TOC FU Call Status: Today's TOC FU Call Status:: Unsuccessful Call (3rd Attempt) Unsuccessful Call (3rd Attempt) Date: 05/12/22  Attempted to reach the patient regarding the most recent Inpatient/ED visit.  Follow Up Plan: No further outreach attempts will be made at this time. We have been unable to contact the patient.  Johnney Killian, RN, BSN, CCM Care Management Coordinator [place] [facility] Phone: [phone]: [phone]

## 2022-05-13 ENCOUNTER — Encounter (HOSPITAL_COMMUNITY): Payer: Self-pay | Admitting: Hematology

## 2022-05-13 ENCOUNTER — Inpatient Hospital Stay: Payer: Medicare HMO | Attending: Hematology

## 2022-05-13 DIAGNOSIS — R7989 Other specified abnormal findings of blood chemistry: Secondary | ICD-10-CM | POA: Diagnosis not present

## 2022-05-13 DIAGNOSIS — D472 Monoclonal gammopathy: Secondary | ICD-10-CM | POA: Diagnosis not present

## 2022-05-13 DIAGNOSIS — G4730 Sleep apnea, unspecified: Secondary | ICD-10-CM | POA: Diagnosis not present

## 2022-05-13 DIAGNOSIS — Z794 Long term (current) use of insulin: Secondary | ICD-10-CM | POA: Insufficient documentation

## 2022-05-13 DIAGNOSIS — Z7982 Long term (current) use of aspirin: Secondary | ICD-10-CM | POA: Insufficient documentation

## 2022-05-13 DIAGNOSIS — E1140 Type 2 diabetes mellitus with diabetic neuropathy, unspecified: Secondary | ICD-10-CM | POA: Diagnosis not present

## 2022-05-13 DIAGNOSIS — Z803 Family history of malignant neoplasm of breast: Secondary | ICD-10-CM | POA: Diagnosis not present

## 2022-05-13 DIAGNOSIS — D631 Anemia in chronic kidney disease: Secondary | ICD-10-CM | POA: Diagnosis not present

## 2022-05-13 DIAGNOSIS — N186 End stage renal disease: Secondary | ICD-10-CM | POA: Diagnosis not present

## 2022-05-13 DIAGNOSIS — I132 Hypertensive heart and chronic kidney disease with heart failure and with stage 5 chronic kidney disease, or end stage renal disease: Secondary | ICD-10-CM | POA: Diagnosis not present

## 2022-05-13 DIAGNOSIS — E1151 Type 2 diabetes mellitus with diabetic peripheral angiopathy without gangrene: Secondary | ICD-10-CM | POA: Diagnosis not present

## 2022-05-13 DIAGNOSIS — I5032 Chronic diastolic (congestive) heart failure: Secondary | ICD-10-CM | POA: Diagnosis not present

## 2022-05-13 DIAGNOSIS — Z7902 Long term (current) use of antithrombotics/antiplatelets: Secondary | ICD-10-CM | POA: Diagnosis not present

## 2022-05-13 DIAGNOSIS — N2581 Secondary hyperparathyroidism of renal origin: Secondary | ICD-10-CM | POA: Diagnosis not present

## 2022-05-13 DIAGNOSIS — Z992 Dependence on renal dialysis: Secondary | ICD-10-CM | POA: Insufficient documentation

## 2022-05-13 DIAGNOSIS — Z7984 Long term (current) use of oral hypoglycemic drugs: Secondary | ICD-10-CM | POA: Insufficient documentation

## 2022-05-13 DIAGNOSIS — E1122 Type 2 diabetes mellitus with diabetic chronic kidney disease: Secondary | ICD-10-CM | POA: Diagnosis not present

## 2022-05-13 DIAGNOSIS — Z79899 Other long term (current) drug therapy: Secondary | ICD-10-CM | POA: Diagnosis not present

## 2022-05-13 DIAGNOSIS — D638 Anemia in other chronic diseases classified elsewhere: Secondary | ICD-10-CM

## 2022-05-13 DIAGNOSIS — Z48812 Encounter for surgical aftercare following surgery on the circulatory system: Secondary | ICD-10-CM | POA: Diagnosis not present

## 2022-05-13 DIAGNOSIS — I120 Hypertensive chronic kidney disease with stage 5 chronic kidney disease or end stage renal disease: Secondary | ICD-10-CM | POA: Insufficient documentation

## 2022-05-13 DIAGNOSIS — I70221 Atherosclerosis of native arteries of extremities with rest pain, right leg: Secondary | ICD-10-CM | POA: Diagnosis not present

## 2022-05-13 LAB — CBC WITH DIFFERENTIAL/PLATELET
Abs Immature Granulocytes: 0.15 10*3/uL — ABNORMAL HIGH (ref 0.00–0.07)
Basophils Absolute: 0.1 10*3/uL (ref 0.0–0.1)
Basophils Relative: 1 %
Eosinophils Absolute: 0.1 10*3/uL (ref 0.0–0.5)
Eosinophils Relative: 1 %
HCT: 34 % — ABNORMAL LOW (ref 36.0–46.0)
Hemoglobin: 10.1 g/dL — ABNORMAL LOW (ref 12.0–15.0)
Immature Granulocytes: 2 %
Lymphocytes Relative: 21 %
Lymphs Abs: 1.7 10*3/uL (ref 0.7–4.0)
MCH: 24.5 pg — ABNORMAL LOW (ref 26.0–34.0)
MCHC: 29.7 g/dL — ABNORMAL LOW (ref 30.0–36.0)
MCV: 82.3 fL (ref 80.0–100.0)
Monocytes Absolute: 0.8 10*3/uL (ref 0.1–1.0)
Monocytes Relative: 10 %
Neutro Abs: 5.3 10*3/uL (ref 1.7–7.7)
Neutrophils Relative %: 65 %
Platelets: 328 10*3/uL (ref 150–400)
RBC: 4.13 MIL/uL (ref 3.87–5.11)
RDW: 19.4 % — ABNORMAL HIGH (ref 11.5–15.5)
WBC: 8.1 10*3/uL (ref 4.0–10.5)
nRBC: 0.2 % (ref 0.0–0.2)

## 2022-05-13 LAB — COMPREHENSIVE METABOLIC PANEL
ALT: 35 U/L (ref 0–44)
AST: 26 U/L (ref 15–41)
Albumin: 3.2 g/dL — ABNORMAL LOW (ref 3.5–5.0)
Alkaline Phosphatase: 90 U/L (ref 38–126)
Anion gap: 11 (ref 5–15)
BUN: 17 mg/dL (ref 8–23)
CO2: 28 mmol/L (ref 22–32)
Calcium: 8.5 mg/dL — ABNORMAL LOW (ref 8.9–10.3)
Chloride: 97 mmol/L — ABNORMAL LOW (ref 98–111)
Creatinine, Ser: 3.33 mg/dL — ABNORMAL HIGH (ref 0.44–1.00)
GFR, Estimated: 15 mL/min — ABNORMAL LOW (ref >60)
Glucose, Bld: 99 mg/dL (ref 70–99)
Potassium: 3 mmol/L — ABNORMAL LOW (ref 3.5–5.1)
Sodium: 136 mmol/L (ref 135–145)
Total Bilirubin: 0.6 mg/dL (ref 0.3–1.2)
Total Protein: 7.1 g/dL (ref 6.5–8.1)

## 2022-05-13 LAB — IRON AND TIBC
Iron: 71 ug/dL (ref 28–170)
Saturation Ratios: 37 % — ABNORMAL HIGH (ref 10.4–31.8)
TIBC: 194 ug/dL — ABNORMAL LOW (ref 250–450)
UIBC: 123 ug/dL

## 2022-05-13 LAB — FOLATE: Folate: 23.4 ng/mL (ref >5.9)

## 2022-05-13 LAB — FERRITIN: Ferritin: 1222 ng/mL — ABNORMAL HIGH (ref 11–307)

## 2022-05-13 LAB — VITAMIN B12: Vitamin B-12: 487 pg/mL (ref 180–914)

## 2022-05-14 ENCOUNTER — Ambulatory Visit: Payer: Medicare HMO | Admitting: "Endocrinology

## 2022-05-14 ENCOUNTER — Encounter: Payer: Self-pay | Admitting: "Endocrinology

## 2022-05-14 VITALS — BP 158/54 | HR 68 | Ht 64.0 in | Wt 181.0 lb

## 2022-05-14 DIAGNOSIS — M818 Other osteoporosis without current pathological fracture: Secondary | ICD-10-CM | POA: Diagnosis not present

## 2022-05-14 MED ORDER — CALCITRIOL 0.25 MCG PO CAPS: 0.2500 ug | ORAL_CAPSULE | Freq: Two times a day (BID) | ORAL | 1 refills | Status: DC

## 2022-05-14 NOTE — Progress Notes (Signed)
05/14/2022, 12:43 PM   Endocrinology follow-up note  Subjective:    Patient ID: Kathryn Beck, female    DOB: 10-04-1955, PCP Fayrene Helper, MD   Past Medical History:  Diagnosis Date   Acid reflux    Anemia    Arthritis    Axillary masses    Soft tissue - status post excision   Back pain    COVID-19 virus infection 04/06/2019   Depression    End-stage renal disease ([place])    [facility]  in [place]   Essential hypertension    History of blood transfusion    History of cardiac catheterization    Normal coronary arteries October 2020   History of claustrophobia    History of pneumonia 2019   Hypoxia 04/03/2019   Mixed hyperlipidemia    Obesity    Pancreatitis    Peritoneal dialysis catheter in place [facility])    Pneumonia due to COVID-19 virus 04/02/2019   Sleep apnea    Noncompliant with CPAP   Type 2 diabetes mellitus ([place])    Past Surgical History:  Procedure Laterality Date   ABDOMINAL AORTOGRAM W/LOWER EXTREMITY N/A 04/30/2022   Procedure: ABDOMINAL AORTOGRAM W/LOWER EXTREMITY;  Surgeon: Cherre Robins, MD;  Location: [place] [facility];  Service: Cardiovascular;  Laterality: N/A;   ABDOMINAL HYSTERECTOMY     AV FISTULA PLACEMENT Left 09/02/2017   Procedure: creation of left arm ARTERIOVENOUS (AV) FISTULA;  Surgeon: Serafina Mitchell, MD;  Location: [facility] OR;  Service: Vascular;  Laterality: Left;   COLONOSCOPY  2008   Dr. Oneida Alar: normal    COLONOSCOPY N/A 12/18/2016   Dr. Oneida Alar: multiple tubular adenomas, internal hemorrhoids. Surveillance in 3 years    ESOPHAGEAL DILATION N/A 10/13/2015   Procedure: ESOPHAGEAL DILATION;  Surgeon: Rogene Houston, MD;  [facility];  Service: Endoscopy;  Laterality: N/A;   ESOPHAGOGASTRODUODENOSCOPY N/A 10/13/2015   Dr. Laural Golden: chronic gastritis on path, no H.pylori. Empiric dilation    ESOPHAGOGASTRODUODENOSCOPY N/A 12/18/2016   Dr. Oneida Alar: mild gastritis. BRAVO study  revealed uncontrolled GERD. Dysphagia secondary to uncontrolled reflux   FOOT SURGERY Bilateral    "nerve"     LEFT HEART CATH AND CORONARY ANGIOGRAPHY N/A 12/29/2018   Procedure: LEFT HEART CATH AND CORONARY ANGIOGRAPHY;  Surgeon: Jettie Booze, MD;  Location: [place] [facility];  Service: Cardiovascular;  Laterality: N/A;   LOWER EXTREMITY ANGIOGRAPHY Right 05/04/2022   Procedure: Lower Extremity Angiography;  Surgeon: Broadus John, MD;  Location: [place] [facility];  Service: Cardiovascular;  Laterality: Right;   LUNG BIOPSY     MASS EXCISION Right 01/09/2013   Procedure: EXCISION OF NEOPLASM OF RIGHT  AXILLA  AND EXCISION OF NEOPLASM OF LEFT AXILLA;  Surgeon: Jamesetta So, MD;  Location: [facility];  Service: General;  Laterality: Right;  procedure end @ 08:23   MYRINGOTOMY WITH TUBE PLACEMENT Bilateral 04/28/2017   Procedure: BILATERAL MYRINGOTOMY WITH TUBE PLACEMENT;  Surgeon: Leta Baptist, MD;  Location: [place];  Service: ENT;  Laterality: Bilateral;   PERIPHERAL VASCULAR BALLOON ANGIOPLASTY Right 05/04/2022   Procedure: PERIPHERAL VASCULAR BALLOON ANGIOPLASTY;  Surgeon: Broadus John, MD;  Location: [place] [facility];  Service: Cardiovascular;  Laterality: Right;  PT   PERIPHERAL VASCULAR INTERVENTION Right 05/04/2022   Procedure: PERIPHERAL VASCULAR INTERVENTION;  Surgeon: Broadus John, MD;  Location: [place] [facility];  Service: Cardiovascular;  Laterality: Right;  SFA   REVISION OF ARTERIOVENOUS GORETEX GRAFT Left 05/04/2018   Procedure: TRANSPOSITION OF CEPHALIC VEIN ARTERIOVENOUS FISTULA LEFT ARM;  Surgeon: Rosetta Posner, MD;  [facility];  Service: Vascular;  Laterality: Left;   SAVORY DILATION N/A 12/18/2016   Procedure: SAVORY DILATION;  Surgeon: Danie Binder, MD;  [facility];  Service: Endoscopy;  Laterality: N/A;   Social History   Socioeconomic History   Marital status: Married    Spouse name: Kathryn Beck   Number of children: 2   Years of  education: 12   Highest education level: 12th grade  Occupational History   Occupation: retired   Tobacco Use   Smoking status: Never    Passive exposure: Never   Smokeless tobacco: Never   Tobacco comments:    Verified by Daughter, Kathryn Beck  Vaping Use   Vaping Use: Never used  Substance and Sexual Activity   Alcohol use: No   Drug use: No   Sexual activity: Not Currently    Partners: Male  Other Topics Concern   Not on file  Social History Narrative   Lives alone with husband    Social Determinants of Health   Financial Resource Strain: Medium Risk (05/12/2022)   Overall Financial Resource Strain (CARDIA)    Difficulty of Paying Living Expenses: Somewhat hard  Food Insecurity: No Food Insecurity (05/12/2022)   Hunger Vital Sign    Worried About Running Out of Food in the Last Year: Never true    [place] in the Last Year: Never true  Transportation Needs: No Transportation Needs (05/12/2022)   PRAPARE - Hydrologist (Medical): No    Lack of Transportation (Non-Medical): No  Physical Activity: Inactive (05/12/2022)   Exercise Vital Sign    Days of Exercise per Week: 0 days    Minutes of Exercise per Session: 0 min  Stress: No Stress Concern Present (05/12/2022)   Kathryn Beck    Feeling of Stress : Not at all  Social Connections: [place] (05/12/2022)   Social Connection and Isolation Panel [NHANES]    Frequency of Communication with Friends and Family: More than three times a week    Frequency of Social Gatherings with Friends and Family: More than three times a week    Attends Religious Services: More than 4 times per year    Active Member of [employer] or Organizations: Yes    Attends Archivist Meetings: 1 to 4 times per year    Marital Status: Married   Family History  Problem Relation Age of Onset   Hypertension Father    Hypercholesterolemia  Father    Arthritis Father    Hypertension Sister    Hypercholesterolemia Sister    Breast cancer Sister    Hypertension Sister    Colon cancer Neg Hx    Colon polyps Neg Hx    Outpatient Encounter Medications as of 05/14/2022  Medication Sig   [DISCONTINUED] calcitRIOL (ROCALTROL) 0.25 MCG capsule Take 1 capsule (0.25 mcg total) by mouth 2 (two) times daily with a meal.   aspirin 81 MG chewable tablet CHEW 2 TABLETS BY MOUTH ONCE DAILY (Patient taking differently: Chew 162 mg by mouth daily.)  B Complex-C-Folic Acid (RENA-VITE RX) 1 MG TABS Take 1 tablet by mouth daily.   Blood Glucose Monitoring Suppl (ONETOUCH VERIO) w/Device KIT Use to check blood sugar 4X daily.   calcitRIOL (ROCALTROL) 0.25 MCG capsule Take 1 capsule (0.25 mcg total) by mouth 2 (two) times daily with a meal.   clopidogrel (PLAVIX) 75 MG tablet Take 1 tablet (75 mg total) by mouth daily with breakfast. (Patient not taking: Reported on 05/14/2022)   Continuous Blood Gluc Receiver (FREESTYLE LIBRE 2 READER) DEVI 1 each by Does not apply route daily.   Continuous Blood Gluc Sensor (FREESTYLE LIBRE 2 SENSOR) MISC 1 each by Does not apply route every 14 (fourteen) days.   DULoxetine (CYMBALTA) 60 MG capsule TAKE (1) CAPSULE BY MOUTH ONCE DAILY. (Patient taking differently: Take 60 mg by mouth daily.)   ezetimibe (ZETIA) 10 MG tablet TAKE ONE TABLET BY MOUTH EVERY DAY   glucose blood (ONETOUCH VERIO) test strip Use as instructed to check blood sugar 4X daily.   hydrOXYzine (ATARAX) 25 MG tablet Take 25 mg by mouth every 12 (twelve) hours as needed for itching.   insulin aspart (FIASP FLEXTOUCH) 100 UNIT/ML FlexTouch Pen Inject 18-30 Units into the skin with breakfast, with lunch, and with evening meal.   insulin glargine, 2 Unit Dial, (TOUJEO MAX SOLOSTAR) 300 UNIT/ML Solostar Pen Inject 80 Units into the skin daily.   Insulin Pen Needle 32G X 4 MM MISC Use 4x a day   metoprolol succinate (TOPROL-XL) 50 MG 24 hr tablet TAKE  (1) TABLET BY MOUTH DAILY WITH FOOD *TAKE AFTER DIALYSIS* (Patient taking differently: Take 50 mg by mouth daily.)   OneTouch Delica Lancets 99991111 MISC Use to check blood sugar 4X daily.   pregabalin (LYRICA) 50 MG capsule TAKE ONE CAPSULE BY MOUTH three times a day (Patient taking differently: Take 50 mg by mouth 3 (three) times daily.)   sertraline (ZOLOFT) 50 MG tablet TAKE ONE TABLET BY MOUTH EVERY DAY   [name] 500 MG chewable tablet Chew 500-1,000 mg by mouth See admin instructions. Take 1092m (2 tablets) by mouth with meals and 5039m(1 tablet) with snacks   [DISCONTINUED] FLUoxetine (PROZAC) 10 MG capsule Take 10 mg by mouth daily.     [DISCONTINUED] furosemide (LASIX) 80 MG tablet Take 80 mg by mouth 2 (two) times daily.   [DISCONTINUED] glipiZIDE (GLUCOTROL) 10 MG tablet Take 10 mg by mouth 2 (two) times daily before a meal.     [DISCONTINUED] ondansetron (ZOFRAN) 4 MG tablet Take 1 tablet (4 mg total) by mouth every 8 (eight) hours as needed for nausea or vomiting.   Facility-Administered Encounter Medications as of 05/14/2022  Medication   0.9 %  sodium chloride infusion   ALLERGIES: Allergies  Allergen Reactions   Ace Inhibitors Anaphylaxis and Swelling   Penicillins Itching, Swelling and Other (See Comments)    Did it involve swelling of the face/tongue/throat, SOB, or low BP? Unknown Did it involve sudden or severe rash/hives, skin peeling, or any reaction on the inside of your mouth or nose? Unknown Did you need to seek medical attention at a hospital or doctor's office? Unknown When did it last happen?      years  If all above answers are "NO", may proceed with cephalosporin use.    Statins Other (See Comments)    elevated LFT's     Albuterol Swelling    VACCINATION STATUS: Immunization History  Administered Date(s) Administered   Fluad Quad(high Dose 65+) 01/22/2022  H1N1 01/16/2008   Hepatitis B 05/14/2018, 06/16/2018, 07/14/2018, 12/26/2018   Hepatitis B,  ADULT 05/14/2018, 06/16/2018, 07/14/2018, 12/26/2018, 01/29/2020, 02/28/2020, 03/06/2020, 07/29/2020   Hepatitis B, PED/ADOLESCENT 05/14/2018, 06/16/2018, 07/14/2018, 12/26/2018   Influenza Inj Mdck Quad Pf 12/30/2018, 01/08/2020   Influenza Split 12/22/2012, 03/12/2015, 01/14/2016, 11/24/2016, 11/09/2017   Influenza Whole 12/28/2004, 02/24/2006, 12/07/2006, 02/01/2008, 01/28/2009, 11/25/2010   Influenza, High Dose Seasonal PF 12/30/2018   Influenza,inj,Quad PF,6+ Mos 12/22/2012, 03/12/2015, 01/14/2016, 11/24/2016, 11/09/2017, 01/02/2021   Influenza-Unspecified 12/26/2019   Moderna SARS-COV2 Booster Vaccination 04/30/2020   Moderna Sars-Covid-2 Vaccination 09/12/2019, 10/23/2019, 04/30/2020   PNEUMOCOCCAL CONJUGATE-20 07/08/2021   PPD Test 03/22/2018, 02/10/2021   Pneumococcal Conjugate Vaccine, 10 Valent 07/31/2020   Pneumococcal Conjugate-13 07/25/2015, 07/31/2020   Pneumococcal Polysaccharide-23 01/28/2009, 07/25/2015, 07/31/2020   Td 11/12/2003   Tdap 10/02/2014   Zoster Recombinat (Shingrix) 12/24/2016   Zoster, Unspecified 12/24/2016    HPI Kathryn Beck is 67 y.o. female who presents today with a medical history as above. she is being seen for follow-up after she was seen in  consultation for osteoporosis requested by Fayrene Helper, MD.   Patient was not a candidate for bisphosphonates due to ESRD, was not a candidate for Prolia due to hypocalcemia.  Prescription for calcitriol was given to her, however reportedly her insurance did not provide coverage.  She returns with repeat labs recently showing still mild hypocalcemia at 8.5.  She is  on dialysis for end-stage renal disease as a complication of uncontrolled diabetes.   In the interim, she saw Dr. Cruzita Lederer at [facility] for diabetes management and patient was kept on intensive treatment with basal/bolus insulin to control her diabetes.  Her recent A1c was 9.3%.  She had bone density on September 25, 2021 which  showed T score of -2.0 on the spine, -2.9 in the hip. She is not on anti-osteoporosis treatment. She has hypocalcemia, not on vitamin D supplements. She denies fractures, cannot confirm if she has lost height.  She is on a wheelchair at baseline. She has multiple medical problems including hypertension, CHF, hyperlipidemia, ischemic heart disease, sleep apnea, ESRD on hemodialysis. 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  Review of Systems  Constitutional: + Significantly fluctuating body weight , + fatigue, no subjective hyperthermia, no subjective hypothermia   Objective:       05/14/2022   10:19 AM 05/11/2022    1:34 PM 05/07/2022    3:46 AM  Vitals with BMI  Height 5' 4"$  5' 4"$    Weight 181 lbs  169 lbs 13 oz  BMI 99991111  123456  Systolic 0000000 123456   Diastolic 54 67   Pulse 68 82     BP (!) 158/54   Pulse 68   Ht 5' 4"$  (1.626 m)   Wt 181 lb (82.1 kg)   BMI 31.07 kg/m   Wt Readings from Last 3 Encounters:  05/14/22 181 lb (82.1 kg)  05/07/22 169 lb 12.8 oz (77 kg)  04/21/22 190 lb 1.3 oz (86.2 kg)    Physical Exam  Constitutional:  Body mass index is 31.07 kg/m.,  not in acute distress, normal state of mind Eyes: PERRLA, EOMI, no exophthalmos ENT: moist mucous membranes, no gross thyromegaly, no gross cervical lymphadenopathy Cardiovascular: normal precordial activity, Regular Rate and Rhythm, no Murmur/Rubs/Gallops   CMP ( most recent) CMP     Component Value Date/Time   NA 136 05/13/2022 1153   NA 139 07/17/2019 1157   K 3.0 (  L) 05/13/2022 1153   CL 97 (L) 05/13/2022 1153   CO2 28 05/13/2022 1153   GLUCOSE 99 05/13/2022 1153   BUN 17 05/13/2022 1153   BUN 43 (A) 07/17/2019 1157   CREATININE 3.33 (H) 05/13/2022 1153   CREATININE 3.74 (H) 08/23/2017 1407   CALCIUM 8.5 (L) 05/13/2022 1153   CALCIUM 9.4 10/15/2017 1258   PROT 7.1 05/13/2022 1153   ALBUMIN 3.2 (L) 05/13/2022 1153   AST 26 05/13/2022 1153   ALT 35  05/13/2022 1153   ALKPHOS 90 05/13/2022 1153   BILITOT 0.6 05/13/2022 1153   GFRNONAA 15 (L) 05/13/2022 1153   GFRNONAA 12 (L) 08/23/2017 1407   GFRAA 8 (L) 07/19/2019 1210   GFRAA 14 (L) 08/23/2017 1407     Diabetic Labs (most recent): Lab Results  Component Value Date   HGBA1C 9.3 (A) 04/16/2022   HGBA1C 8.9 (A) 10/28/2021   HGBA1C 9.1 (A) 07/22/2021   MICROALBUR 75.9 02/28/2016   MICROALBUR 3,246.9 (H) 10/09/2015   MICROALBUR 92.2 (H) 10/02/2014     Lipid Panel ( most recent) Lipid Panel     Component Value Date/Time   CHOL 116 05/01/2022 0039   TRIG 294 (H) 05/01/2022 0039   HDL 26 (L) 05/01/2022 0039   CHOLHDL 4.5 05/01/2022 0039   VLDL 59 (H) 05/01/2022 0039   LDLCALC 31 05/01/2022 0039   LDLCALC 109 (H) 11/12/2017 0936   LDLDIRECT 120.0 11/21/2020 1402      Lab Results  Component Value Date   TSH 0.91 10/28/2021   TSH 4.16 12/24/2016   TSH 2.006 04/25/2015   TSH 2.848 10/02/2014   TSH 1.923 12/20/2012   TSH 2.329 10/19/2011   TSH 2.162 08/19/2010   TSH 3.751 01/28/2009   TSH 3.08 12/09/2007   FREET4 0.93 10/28/2021   FREET4 0.8 12/09/2007   FREET4 1.56 06/02/2007      Assessment & Plan:   1. osteoporosis without current pathological fracture  - I have reviewed her available bone density records and clinically evaluated the patient. - Based on these reviews, she has osteoporosis of the hips, osteopenia on the spine,  however, in ESRD patients the gold standard diagnostic studies bone biopsy.  However, considering her multiple comorbidities, it is advised to avoid this invasive test on her for now.  Unfortunately, she could not fill the prescription for Rocaltrol given to her during her last visit to correct hypocalcemia.    She will still need this intervention before she will be considered for Prolia injection.  She is not a candidate for bisphosphonates. Hence, I discussed and prescribed calcitriol 0.25 mcg p.o. twice daily.  She will have repeat  CMP and office visit in 6 months. On previous attempt, she wished to manage her diabetes with her PMD, however I noticed that she saw another endocrinologist-Dr. Cruzita Lederer and that [facility] endocrinology.    It is not necessary for her to see to endocrinologist.  She was approached to merge her  endocrine care for diabetes and osteoporosis either here or at [facility].  She did not make a decision today.  - she is advised to maintain close follow up with Fayrene Helper, MD for primary care needs.   I spent  23  minutes in the care of the patient today including review of labs from Thyroid Function, CMP, and other relevant labs ; imaging/biopsy records (current and previous including abstractions from other facilities); face-to-face time discussing  her lab results and symptoms, medications doses, her options of short  and long term treatment based on the latest standards of care / guidelines;   and documenting the encounter.  Waldon Reining  participated in the discussions, expressed understanding, and voiced agreement with the above plans.  All questions were answered to her satisfaction. she is encouraged to contact clinic should she have any questions or concerns prior to her return visit.   Follow up plan: Return in about 6 months (around 11/12/2022) for F/U with Pre-visit Labs.   Glade Lloyd, MD [facility] [address] [place], [place] [address] Phone: [phone]  Fax: [phone]     05/14/2022, 12:43 PM  This note was partially dictated with voice recognition software. Similar sounding words can be transcribed inadequately or may not  be corrected upon review.

## 2022-05-15 ENCOUNTER — Telehealth: Payer: Self-pay

## 2022-05-15 DIAGNOSIS — I70221 Atherosclerosis of native arteries of extremities with rest pain, right leg: Secondary | ICD-10-CM | POA: Diagnosis not present

## 2022-05-15 DIAGNOSIS — E1122 Type 2 diabetes mellitus with diabetic chronic kidney disease: Secondary | ICD-10-CM | POA: Diagnosis not present

## 2022-05-15 DIAGNOSIS — Z992 Dependence on renal dialysis: Secondary | ICD-10-CM | POA: Diagnosis not present

## 2022-05-15 DIAGNOSIS — G4730 Sleep apnea, unspecified: Secondary | ICD-10-CM | POA: Diagnosis not present

## 2022-05-15 DIAGNOSIS — I5032 Chronic diastolic (congestive) heart failure: Secondary | ICD-10-CM | POA: Diagnosis not present

## 2022-05-15 DIAGNOSIS — N186 End stage renal disease: Secondary | ICD-10-CM | POA: Diagnosis not present

## 2022-05-15 DIAGNOSIS — N2581 Secondary hyperparathyroidism of renal origin: Secondary | ICD-10-CM | POA: Diagnosis not present

## 2022-05-15 DIAGNOSIS — I132 Hypertensive heart and chronic kidney disease with heart failure and with stage 5 chronic kidney disease, or end stage renal disease: Secondary | ICD-10-CM | POA: Diagnosis not present

## 2022-05-15 DIAGNOSIS — E1151 Type 2 diabetes mellitus with diabetic peripheral angiopathy without gangrene: Secondary | ICD-10-CM | POA: Diagnosis not present

## 2022-05-15 DIAGNOSIS — Z48812 Encounter for surgical aftercare following surgery on the circulatory system: Secondary | ICD-10-CM | POA: Diagnosis not present

## 2022-05-15 DIAGNOSIS — E1140 Type 2 diabetes mellitus with diabetic neuropathy, unspecified: Secondary | ICD-10-CM | POA: Diagnosis not present

## 2022-05-15 NOTE — Patient Outreach (Signed)
Received a Pharmacy referral for Ms. Pla from [name], [place].   I have sent a referral to the [place] team..   [facility], [place], [place] Management [phone]

## 2022-05-18 ENCOUNTER — Telehealth: Payer: Self-pay | Admitting: Pharmacist

## 2022-05-18 DIAGNOSIS — Z992 Dependence on renal dialysis: Secondary | ICD-10-CM | POA: Diagnosis not present

## 2022-05-18 DIAGNOSIS — N2581 Secondary hyperparathyroidism of renal origin: Secondary | ICD-10-CM | POA: Diagnosis not present

## 2022-05-18 DIAGNOSIS — N186 End stage renal disease: Secondary | ICD-10-CM | POA: Diagnosis not present

## 2022-05-18 LAB — METHYLMALONIC ACID, SERUM: Methylmalonic Acid, Quantitative: 541 nmol/L — ABNORMAL HIGH (ref 0–378)

## 2022-05-18 NOTE — Progress Notes (Unsigned)
[facility]) Care Management  [place]   05/18/2022  Kathryn Beck 03/04/1956 [id number]  Reason for referral: medication assistance  Referral source: [facility] CM Nurse Referral medication(s): Kathryn Beck & Kathryn Beck Current insurance:[name]  Objective: Allergies  Allergen Reactions   Ace Inhibitors Anaphylaxis and Swelling   Penicillins Itching, Swelling and Other (See Comments)    Did it involve swelling of the face/tongue/throat, SOB, or low BP? Unknown Did it involve sudden or severe rash/hives, skin peeling, or any reaction on the inside of your mouth or nose? Unknown Did you need to seek medical attention at a hospital or doctor's office? Unknown When did it last happen?      years  If all above answers are "NO", may proceed with cephalosporin use.    Statins Other (See Comments)    elevated LFT's     Albuterol Swelling     Medication Assistance Findings:  Medication assistance needs identified: Spoke with patient's daughter who is on her HIPAA. HIPAA identifiers were obtained.  Patient is having difficulty affording [place] and [place].  Kathryn Beck is available through [place] provides resources for patients with diabetes.  The diabetes fund is currently closed with the [place] but the patient was placed on their waiting list.   Kathryn Beck is available through [employer] Patient Assistance Program.    Patient's daughter will get back to me on her father's income for an assessment for LIS.    Additional medication assistance options reviewed with patient as warranted:  Foundation programs  Plan: I will route patient assistance letter to [place] technician who will coordinate patient assistance program application process for medications listed above.  [facility] pharmacy technician will assist with obtaining all required documents from both patient and provider(s) and submit application(s) once completed.     Kathryn Beck, PharmD, [place] Clinical Pharmacist [phone]

## 2022-05-19 ENCOUNTER — Ambulatory Visit (INDEPENDENT_AMBULATORY_CARE_PROVIDER_SITE_OTHER): Payer: Medicare HMO | Admitting: Family Medicine

## 2022-05-19 ENCOUNTER — Encounter: Payer: Self-pay | Admitting: Family Medicine

## 2022-05-19 ENCOUNTER — Inpatient Hospital Stay: Payer: 59 | Admitting: Family Medicine

## 2022-05-19 VITALS — BP 150/72 | HR 85

## 2022-05-19 DIAGNOSIS — I10 Essential (primary) hypertension: Secondary | ICD-10-CM

## 2022-05-19 DIAGNOSIS — I96 Gangrene, not elsewhere classified: Secondary | ICD-10-CM | POA: Diagnosis not present

## 2022-05-19 DIAGNOSIS — N186 End stage renal disease: Secondary | ICD-10-CM | POA: Diagnosis not present

## 2022-05-19 DIAGNOSIS — Z794 Long term (current) use of insulin: Secondary | ICD-10-CM | POA: Diagnosis not present

## 2022-05-19 DIAGNOSIS — R413 Other amnesia: Secondary | ICD-10-CM

## 2022-05-19 DIAGNOSIS — E1122 Type 2 diabetes mellitus with diabetic chronic kidney disease: Secondary | ICD-10-CM | POA: Diagnosis not present

## 2022-05-19 DIAGNOSIS — H04203 Unspecified epiphora, bilateral lacrimal glands: Secondary | ICD-10-CM | POA: Diagnosis not present

## 2022-05-19 DIAGNOSIS — R410 Disorientation, unspecified: Secondary | ICD-10-CM | POA: Diagnosis not present

## 2022-05-19 DIAGNOSIS — E1165 Type 2 diabetes mellitus with hyperglycemia: Secondary | ICD-10-CM

## 2022-05-19 DIAGNOSIS — Z48812 Encounter for surgical aftercare following surgery on the circulatory system: Secondary | ICD-10-CM | POA: Diagnosis not present

## 2022-05-19 DIAGNOSIS — R441 Visual hallucinations: Secondary | ICD-10-CM | POA: Diagnosis not present

## 2022-05-19 DIAGNOSIS — E1140 Type 2 diabetes mellitus with diabetic neuropathy, unspecified: Secondary | ICD-10-CM | POA: Diagnosis not present

## 2022-05-19 DIAGNOSIS — I5032 Chronic diastolic (congestive) heart failure: Secondary | ICD-10-CM | POA: Diagnosis not present

## 2022-05-19 DIAGNOSIS — Z09 Encounter for follow-up examination after completed treatment for conditions other than malignant neoplasm: Secondary | ICD-10-CM | POA: Diagnosis not present

## 2022-05-19 DIAGNOSIS — E1151 Type 2 diabetes mellitus with diabetic peripheral angiopathy without gangrene: Secondary | ICD-10-CM | POA: Diagnosis not present

## 2022-05-19 DIAGNOSIS — I132 Hypertensive heart and chronic kidney disease with heart failure and with stage 5 chronic kidney disease, or end stage renal disease: Secondary | ICD-10-CM | POA: Diagnosis not present

## 2022-05-19 DIAGNOSIS — G4730 Sleep apnea, unspecified: Secondary | ICD-10-CM | POA: Diagnosis not present

## 2022-05-19 DIAGNOSIS — I70221 Atherosclerosis of native arteries of extremities with rest pain, right leg: Secondary | ICD-10-CM | POA: Diagnosis not present

## 2022-05-19 MED ORDER — AZELASTINE HCL 0.05 % OP SOLN: 1.0000 [drp] | Freq: Two times a day (BID) | OPHTHALMIC | 2 refills | Status: DC

## 2022-05-19 NOTE — Assessment & Plan Note (Signed)
Family reports worsening symptoms in past 6 monhts

## 2022-05-19 NOTE — Patient Instructions (Addendum)
F/U in 5 to 6 weeks, call if you need me sooner  I DO recommend amputation of your great toe  I will refer you to Neurology regarding concerns discussed  Please schedule mammogram at checkout  Eye drops for allergies have been prescribed  Very important that you go  to the [facility] for management of your blood pressure  Thanks for choosing [facility], we consider it a privelige to serve you.

## 2022-05-19 NOTE — Assessment & Plan Note (Signed)
Patient in for follow up of recent hospitalization. Discharge summary, and laboratory and radiology data are reviewed, and any questions or concerns  are discussed. Specific issues requiring follow up are specifically addressed.  

## 2022-05-19 NOTE — Progress Notes (Signed)
Kathryn Beck     MRN: [id number]      DOB: [birth year]   HPI [name] is here for follow up of hospitalization from 2/2 to 05/07/2022.for gangrene of toe of riht foot, now deciding along with her fmily that amputation is necessary, will meet with Ortho to determine the extent Familty concerned about fluctuating and uncontrolled Blod pressure, she has been referred to [facility] and needs to keep appt, compkex situationas bottoms out on dialysis also C/O watery itchy eyes, allergy eye drops prescribed C/o visual hallucinations, confusion and memory loss , progresively worsning refer to Neurology ROS Denies recent fever or chills. Denies sinus pressure, nasal congestion, ear pain or sore throat. Denies chest congestion, productive cough or wheezing. Denies chest pains, palpitations and leg swelling Denies abdominal pain, nausea, vomiting,diarrhea or constipation.   Denies dysuria, frequency, hesitancy or incontinence. Chronic joint pain, swelling and limitation in mobility. Denies uncontrolled depression, anxiety or insomnia.  PE  BP (!) 150/72   Pulse 85   SpO2 95%   Patient alert  and in no cardiopulmonary distress.  HEENT: No facial asymmetry, EOMI,     Neck decreased ROM.  Chest: Clear to auscultation bilaterally.  CVS: S1, S2 no murmurs, no S3.Regular rate.  ABD: Soft non tender.   Ext: No edema  MS: decreased  ROM spine, shoulders, hips and knees.  Skin: gangrene of right great toe  Psych: Good eye contact, flat  affect.  not anxious or depressed appearing.  CNS: CN 2-12 intact, power,  normal throughout.no focal deficits noted.   [facility] discharge follow-up Patient in for follow up of recent hospitalization. Discharge summary, and laboratory and radiology data are reviewed, and any questions or concerns  are discussed. Specific issues requiring follow up are specifically addressed.   Gangrene of toe of right foot ([place]) Pt advised to have  amputation as toe is necrotic, and studies done while hospitalized show very poor arterial flow, pt and family now agreeable to procedure  Malignant hypertension Uncontrolled , labile will refer to [facility] again  Type 2 diabetes mellitus with hyperglycemia [facility]) [name] is reminded of the importance of commitment to daily physical activity for 30 minutes or more, as able and the need to limit carbohydrate intake to 30 to 60 grams per meal to help with blood sugar control.   The need to take medication as prescribed, test blood sugar as directed, and to call between visits if there is a concern that blood sugar is uncontrolled is also discussed.   [name] is reminded of the importance of daily foot exam, annual eye examination, and good blood sugar, blood pressure and cholesterol control.     Latest Ref Rng & Units 05/13/2022   11:53 AM 05/07/2022    1:01 AM 05/06/2022   12:37 AM 05/05/2022   12:37 AM 05/04/2022    1:15 AM  Diabetic Labs  Creatinine 0.44 - 1.00 mg/dL 3.33  5.19  8.11  6.30  8.91       05/19/2022    4:45 PM 05/19/2022    4:13 PM 05/19/2022    4:11 PM 05/14/2022   10:19 AM 05/11/2022    1:34 PM 05/07/2022    3:46 AM 05/07/2022    3:00 AM  BP/Weight  Systolic BP Q000111Q 0000000 [id number] 0000000 123456  96  Diastolic BP 72 78 74 54 67  46  Wt. (Lbs)    181  169.8   BMI  31.07 kg/m2  29.15 kg/m2       Latest Ref Rng & Units 04/16/2022   11:20 AM 07/24/2021    3:42 PM  Foot/eye exam completion dates  Eye Exam No Retinopathy  No Retinopathy      Foot Form Completion  Done      This result is from an external source.      Managed by Endo, has neurosurg eval pending at [facility] re abnormal brain scan  Confusion Approx 8 month h/o progressive confusion, memory loss and visual hallucinations in recent 3 months, refer Neurolgy  Memory loss or impairment Family reports worsening symptoms in past 6 monhts  Visual hallucinations Report is of visual hallucinations in past approx 6 weeks,  refer Neurolgy  Watery eyes Cromolyn eye drops prescribed

## 2022-05-19 NOTE — Assessment & Plan Note (Signed)
Pt advised to have amputation as toe is necrotic, and studies done while hospitalized show very poor arterial flow, pt and family now agreeable to procedure

## 2022-05-19 NOTE — Assessment & Plan Note (Signed)
Report is of visual hallucinations in past approx 6 weeks, refer Neurolgy

## 2022-05-19 NOTE — Assessment & Plan Note (Signed)
Cromolyn eye drops prescribed

## 2022-05-19 NOTE — Assessment & Plan Note (Signed)
Uncontrolled , labile will refer to [facility] again

## 2022-05-19 NOTE — Assessment & Plan Note (Signed)
Approx 8 month h/o progressive confusion, memory loss and visual hallucinations in recent 3 months, refer Neurolgy

## 2022-05-19 NOTE — Assessment & Plan Note (Signed)
Kathryn Beck is reminded of the importance of commitment to daily physical activity for 30 minutes or more, as able and the need to limit carbohydrate intake to 30 to 60 grams per meal to help with blood sugar control.   The need to take medication as prescribed, test blood sugar as directed, and to call between visits if there is a concern that blood sugar is uncontrolled is also discussed.   Kathryn Beck is reminded of the importance of daily foot exam, annual eye examination, and good blood sugar, blood pressure and cholesterol control.     Latest Ref Rng & Units 05/13/2022   11:53 AM 05/07/2022    1:01 AM 05/06/2022   12:37 AM 05/05/2022   12:37 AM 05/04/2022    1:15 AM  Diabetic Labs  Creatinine 0.44 - 1.00 mg/dL 3.33  5.19  8.11  6.30  8.91       05/19/2022    4:45 PM 05/19/2022    4:13 PM 05/19/2022    4:11 PM 05/14/2022   10:19 AM 05/11/2022    1:34 PM 05/07/2022    3:46 AM 05/07/2022    3:00 AM  BP/Weight  Systolic BP Q000111Q 0000000 [id number] 0000000 123456  96  Diastolic BP 72 78 74 54 67  46  Wt. (Lbs)    181  169.8   BMI    31.07 kg/m2  29.15 kg/m2       Latest Ref Rng & Units 04/16/2022   11:20 AM 07/24/2021    3:42 PM  Foot/eye exam completion dates  Eye Exam No Retinopathy  No Retinopathy      Foot Form Completion  Done      This result is from an external source.      Managed by Endo, has neurosurg eval pending at [facility] re abnormal brain scan

## 2022-05-20 ENCOUNTER — Encounter: Payer: Self-pay | Admitting: *Deleted

## 2022-05-20 ENCOUNTER — Ambulatory Visit: Payer: Self-pay | Admitting: *Deleted

## 2022-05-20 ENCOUNTER — Encounter: Payer: Self-pay | Admitting: Pulmonary Disease

## 2022-05-20 DIAGNOSIS — N2581 Secondary hyperparathyroidism of renal origin: Secondary | ICD-10-CM | POA: Diagnosis not present

## 2022-05-20 DIAGNOSIS — G4730 Sleep apnea, unspecified: Secondary | ICD-10-CM | POA: Diagnosis not present

## 2022-05-20 DIAGNOSIS — Z48812 Encounter for surgical aftercare following surgery on the circulatory system: Secondary | ICD-10-CM | POA: Diagnosis not present

## 2022-05-20 DIAGNOSIS — I132 Hypertensive heart and chronic kidney disease with heart failure and with stage 5 chronic kidney disease, or end stage renal disease: Secondary | ICD-10-CM | POA: Diagnosis not present

## 2022-05-20 DIAGNOSIS — I5032 Chronic diastolic (congestive) heart failure: Secondary | ICD-10-CM | POA: Diagnosis not present

## 2022-05-20 DIAGNOSIS — E1151 Type 2 diabetes mellitus with diabetic peripheral angiopathy without gangrene: Secondary | ICD-10-CM | POA: Diagnosis not present

## 2022-05-20 DIAGNOSIS — Z992 Dependence on renal dialysis: Secondary | ICD-10-CM | POA: Diagnosis not present

## 2022-05-20 DIAGNOSIS — N186 End stage renal disease: Secondary | ICD-10-CM | POA: Diagnosis not present

## 2022-05-20 DIAGNOSIS — E1122 Type 2 diabetes mellitus with diabetic chronic kidney disease: Secondary | ICD-10-CM | POA: Diagnosis not present

## 2022-05-20 DIAGNOSIS — I70221 Atherosclerosis of native arteries of extremities with rest pain, right leg: Secondary | ICD-10-CM | POA: Diagnosis not present

## 2022-05-20 DIAGNOSIS — E1140 Type 2 diabetes mellitus with diabetic neuropathy, unspecified: Secondary | ICD-10-CM | POA: Diagnosis not present

## 2022-05-20 NOTE — Patient Outreach (Signed)
Care Coordination   Follow Up Visit Note   05/20/2022  Name: Kathryn Beck MRN: [id number] DOB: 1955/08/04  Kathryn Beck is a 67 y.o. year old female who sees Kathryn Helper, MD for primary care. I spoke with daughter, Kathryn Beck by phone today.  What matters to the patients health and wellness today?   Receive Assistance Applying for Aid & Attendance Benefits, through [employer].   Goals Addressed             This Visit's Progress    Receive Assistance Applying for Aid & Attendance Benefits, through [employer].   On track    Care Coordination Interventions:  Interventions Today    Flowsheet Row Most Recent Value  Chronic Disease   Chronic disease during today's visit Diabetes, Hypertension (HTN), Chronic Kidney Disease/End Stage Renal Disease (ESRD), Congestive Heart Failure (CHF), Other  [Neuropathy, Chronic Pain Syndrome, Anxiety, Depression, Memory Loss, Neurocognitive Disorder & Requires Maximum Assistance with Activities of Daily Living]  General Interventions   General Interventions Discussed/Reviewed General Interventions Reviewed, Annual Eye Exam, General Interventions Discussed, Labs, Annual Foot Exam, Lipid Profile, Durable Medical Equipment (DME), Vaccines, Health Screening, [employer], Doctor Visits, Communication with, Level of Care  [Primary Care Provider]  Labs Hgb A1c every 3 months, Kidney Function  Vaccines COVID-19, Flu, Pneumonia, RSV, Shingles, Tetanus/Pertussis/Diphtheria  [Encouraged]  Doctor Visits Discussed/Reviewed Doctor Visits Discussed, Doctor Visits Reviewed, Annual Wellness Visits, PCP, Specialist  [Encouraged]  Health Screening Colonoscopy, Mammogram  [Encouraged]  Durable Medical Equipment (DME) BP Cuff, Glucomoter, Environmental consultant, Wheelchair, Estate agent  PCP/Specialist Visits Compliance with follow-up visit  Communication with PCP/Specialists, RN  Level of Care Adult Daycare,  Location manager, Assisted Living, [place]  Applications Medicaid, Personal Care Services  Exercise Interventions   Exercise Discussed/Reviewed Exercise Discussed, Exercise Reviewed, Physical Activity, Assistive device use and maintanence  Physical Activity Discussed/Reviewed Physical Activity Discussed, Physical Activity Reviewed, Types of exercise, Home Exercise Program (HEP)  [Encouraged]  Education Interventions   Education Provided Provided Engineer, site, Provided Web-based Education, Provided Education  Provided Verbal Education On Nutrition, Foot Care, Eye Care, Labs, Blood Sugar Monitoring, Mental Health/Coping with Illness, Applications, Exercise, Medication, When to see the doctor, [employer], Engineer, materials, [place] Discussed, Mental Health Reviewed, Coping Strategies, Crisis, Anxiety, Depression, Grief and Loss, Substance Abuse, Suicide  Nutrition Interventions   Nutrition Discussed/Reviewed Nutrition Discussed, Nutrition Reviewed, Adding fruits and vegetables, Fluid intake, Decreasing sugar intake, Increaing proteins, Decreasing fats, Decreasing salt  Pharmacy Interventions   Pharmacy Dicussed/Reviewed Pharmacy Topics Discussed, Pharmacy Topics Reviewed, Medication Adherence, Affording Medications, Referral to Pharmacist  Medication Adherence Unable to refill medication  Referral to Pharmacist Cannot afford medications  Safety Interventions   Safety Discussed/Reviewed Safety Discussed, Safety Reviewed, Fall Risk, Home Safety  Home Safety Assistive Devices, Need for home safety assessment, Refer for home visit, Contact provider for referral to PT/OT, Refer for community resources, Contact home health agency  Advanced Directive Interventions   Advanced Directives Discussed/Reviewed Advanced Directives Discussed, Advanced  Directives Reviewed, Advanced Care Planning, Provided resource for acquiring and filling out documents     Active Listening & Reflection Utilized.  Emotional Support Provided. Verbalization of Feelings Encouraged.  Caregiver Stress Acknowledged. Symptoms of Caregiver Burnout Validated. Caregiver Resources Discussed. Caregiver Support Groups Reviewed. Self-Enrollment in Caregiver Support Group of Interest Emphasized. Solution-Focused Strategies Promoted. Problem Solving Interventions Activated. Task-Centered Solutions  Implemented.  Cognitive Behavioral Therapy Performed.  Client-Centered Therapy Indicated. Acceptance & Commitment Therapy Initiated. Crisis Information, Agencies, Services & Resources Provided.    CSW Collaboration with Daughter, Kathryn Beck to Encourage Consideration of Higher Level of Care Placement ([place]). CSW Collaboration with Daughter, Kathryn Beck to Assist with Completion of Application for Aid & Attendance Benefits, through [employer]. CSW Collaboration with Daughter, Kathryn Beck to Confirm Order for [place] Obtained from Primary Care Provider, Kathryn Beck with [facility] [phone]). CSW Collaboration with Representative from [facility] [phone]), to Confirm Order for [place] Start of Care Date:  05/19/2022. CSW Collaboration with Daughter, Kathryn Beck to [employer] [phone]). CSW Collaboration with Daughter, Kathryn Beck to Confirm Collaboration with Kathryn Beck, Pharmacist with [facility] [phone]), to Provide Assistance with Prescription Medications.      SDOH assessments and interventions  completed:  Yes.  Care Coordination Interventions:  Yes, provided.   Follow up plan: Follow up call scheduled for 06/04/2022 at 9:30 am.  Encounter Outcome:  Pt. Visit Completed.   Kathryn Beck, BSW, MSW, LCSW  Licensed Education officer, environmental Health System  Mailing [place] [address] [place], [place] [address] Physical Address-[address], [place], [place] [address] Toll Free Main # [phone] Fax # [phone] Cell # [phone] [email or web address]

## 2022-05-20 NOTE — Patient Instructions (Signed)
Visit Information  Thank you for taking time to visit with me today. Please don't hesitate to contact me if I can be of assistance to you.   Following are the goals we discussed today:   Goals Addressed             This Visit's Progress    Receive Assistance Applying for Aid & Attendance Benefits, through [employer].   On track    Care Coordination Interventions:  Interventions Today    Flowsheet Row Most Recent Value  Chronic Disease   Chronic disease during today's visit Diabetes, Hypertension (HTN), Chronic Kidney Disease/End Stage Renal Disease (ESRD), Congestive Heart Failure (CHF), Other  [Neuropathy, Chronic Pain Syndrome, Anxiety, Depression, Memory Loss, Neurocognitive Disorder & Requires Maximum Assistance with Activities of Daily Living]  General Interventions   General Interventions Discussed/Reviewed General Interventions Reviewed, Annual Eye Exam, General Interventions Discussed, Labs, Annual Foot Exam, Lipid Profile, Durable Medical Equipment (DME), Vaccines, Health Screening, [employer], Doctor Visits, Communication with, Level of Care  [Primary Care Provider]  Labs Hgb A1c every 3 months, Kidney Function  Vaccines COVID-19, Flu, Pneumonia, RSV, Shingles, Tetanus/Pertussis/Diphtheria  [Encouraged]  Doctor Visits Discussed/Reviewed Doctor Visits Discussed, Doctor Visits Reviewed, Annual Wellness Visits, PCP, Specialist  [Encouraged]  Health Screening Colonoscopy, Mammogram  [Encouraged]  Durable Medical Equipment (DME) BP Cuff, Glucomoter, Environmental consultant, Wheelchair, Estate agent  PCP/Specialist Visits Compliance with follow-up visit  Communication with PCP/Specialists, RN  Level of Care Adult Daycare, Location manager, Assisted Living, [place]  Applications Medicaid, Personal Care Services  Exercise Interventions   Exercise Discussed/Reviewed Exercise Discussed, Exercise Reviewed, Physical  Activity, Assistive device use and maintanence  Physical Activity Discussed/Reviewed Physical Activity Discussed, Physical Activity Reviewed, Types of exercise, Home Exercise Program (HEP)  [Encouraged]  Education Interventions   Education Provided Provided Engineer, site, Provided Web-based Education, Provided Education  Provided Verbal Education On Nutrition, Foot Care, Eye Care, Labs, Blood Sugar Monitoring, Mental Health/Coping with Illness, Applications, Exercise, Medication, When to see the doctor, [employer], Engineer, materials, [place] Discussed, Mental Health Reviewed, Coping Strategies, Crisis, Anxiety, Depression, Grief and Loss, Substance Abuse, Suicide  Nutrition Interventions   Nutrition Discussed/Reviewed Nutrition Discussed, Nutrition Reviewed, Adding fruits and vegetables, Fluid intake, Decreasing sugar intake, Increaing proteins, Decreasing fats, Decreasing salt  Pharmacy Interventions   Pharmacy Dicussed/Reviewed Pharmacy Topics Discussed, Pharmacy Topics Reviewed, Medication Adherence, Affording Medications, Referral to Pharmacist  Medication Adherence Unable to refill medication  Referral to Pharmacist Cannot afford medications  Safety Interventions   Safety Discussed/Reviewed Safety Discussed, Safety Reviewed, Fall Risk, Home Safety  Home Safety Assistive Devices, Need for home safety assessment, Refer for home visit, Contact provider for referral to PT/OT, Refer for community resources, Contact home health agency  Advanced Directive Interventions   Advanced Directives Discussed/Reviewed Advanced Directives Discussed, Advanced Directives Reviewed, Advanced Care Planning, Provided resource for acquiring and filling out documents     Active Listening & Reflection Utilized.  Emotional Support Provided. Verbalization of Feelings Encouraged.  Caregiver  Stress Acknowledged. Symptoms of Caregiver Burnout Validated. Caregiver Resources Discussed. Caregiver Support Groups Reviewed. Self-Enrollment in Caregiver Support Group of Interest Emphasized. Solution-Focused Strategies Promoted. Problem Solving Interventions Activated. Task-Centered Solutions Implemented.  Cognitive Behavioral Therapy Performed.  Client-Centered Therapy Indicated. Acceptance & Commitment Therapy Initiated. Crisis Information, Agencies, Services & Resources Provided.    CSW Collaboration with Daughter, [name] to Encourage Consideration of Higher  Level of Care Placement ([place]). CSW Collaboration with Daughter, [name] to Assist with Completion of Application for Aid & Attendance Benefits, through [employer]. CSW Collaboration with Daughter, [name] to Confirm Order for [place] Obtained from Primary Care Provider, Dr. Tula Nakayama with [facility] [phone]). CSW Collaboration with Representative from [facility] [phone]), to Confirm Order for [place] Start of Care Date:  05/19/2022. CSW Collaboration with Daughter, [name] to [employer] [phone]). CSW Collaboration with Daughter, [name] to Confirm Collaboration with Dr. Denyse Amass, Pharmacist with [facility] [phone]), to Provide Assistance with Prescription Medications.      Our next appointment is by telephone on 06/04/2022 at 9:30 am.  Please call the care guide team at [phone] if you need to cancel or reschedule your appointment.   If you are experiencing a Mental Health or [place] or need someone to talk to,  please call the Suicide and Crisis Lifeline: 988 call the [place] National Suicide Prevention Lifeline: 305-872-7194 or TTY: 825-495-7008 TTY (671)084-1574) to talk to a trained counselor call 1-800-273-TALK (toll free, 24 hour hotline) go to [facility] [address] [place] [phone]) call the [place]: [phone] call 911  Patient verbalizes understanding of instructions and care plan provided today and agrees to view in [place]. Active MyChart status and patient understanding of how to access instructions and care plan via MyChart confirmed with patient.     Telephone follow up appointment with care management team member scheduled for:  06/04/2022 at 9:30 am.  Nat Christen, BSW, MSW, [place]  Licensed Clinical Social Worker  [place]  Mailing [place]. [address], [place], [place] [address] Physical Address-[address] [place], [place] [address] Toll Free Main # [phone] Fax # [phone] Cell # [phone] [email or web address]

## 2022-05-20 NOTE — Progress Notes (Unsigned)
[place], [place] [address]   CLINIC:  Medical Oncology/Hematology  PCP:  Fayrene Helper, MD [address], [address] [place] [address] [phone]   REASON FOR VISIT:  Follow-up for questionable MGUS and normocytic anemia  CURRENT THERAPY: Observation  INTERVAL HISTORY:   [name] 67 y.o. female returns for routine follow-up of MGUS and normocytic anemia.  She was last seen by Tarri Abernethy PA-C on 10/02/2021.  At today's visit, she reports feeling fairly well.  Since her last visit, she was hospitalized from 04/24/2022 through 05/07/2022 for gangrene of her right toe.  She has had some progressive confusion and cognitive decline for the past 8 months which is being worked up by her PCP and neurology.    She denies any new bone pain or recent fractures. She denies any B symptoms such as fever, chills, night sweats, unintentional weight loss.  She has chronic diabetic neuropathy.  No new masses or lymphadenopathy per her report.   She denies any signs of bleeding such as bright red blood per rectum or melena.  She has had ongoing fatigue and mild dyspnea on exertion.  She has occasional lightheadedness and near syncopal episodes when her blood pressure drops during dialysis.  She denies any pica, restless legs, or headaches.   She has 40% energy and 70% appetite. She endorses that she is maintaining a stable weight.   ASSESSMENT & PLAN:  1.  IgG Kappa MGUS, questionable - Patient with a history of ESRD on dialysis, work-up for monoclonal gammopathy on 08/09/2017 showed an M spike of 0.1 g/dL of IgG kappa monoclonal protein.   - M-Spike has been intermittently elevated, peaked at 0.4 in August 2019 - has been undetectable since April 2021 -  Immunofixation initially showed biclonal IgG kappa, has since showed polyclonal gammopathy and was negative in 2022 and 2023 - Bone marrow biopsy done on 03/03/2019 showed slightly  hypercellular marrow (50% with trilineage hematopoiesis) polytypic plasmacytosis. - Skeletal survey done on 06/20/2021 showed no lytic lesions. - Most recent MGUS/myeloma labs (06/10/2021) were undetectable.  Immunofixation was negative.  No M spike evident on SPEP.  Elevated kappa (226.1) and lambda (105.2) light chains in keeping with ESRD with ratio 2.15. -- No definitive CRAB features at this time: Elevated creatinine attributable to ESRD of other causes.  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  Calcium normal. --No bone pain, B symptoms, or neurologic changes.  She has chronic diabetic neuropathy. - Diagnosis of MGUS is somewhat questionable - she may have previously had some reactive polyclonal gammopathy, but most recent labs do not show any sign of MGUS - PLAN: Repeat MGUS/myeloma labs annually  - next due around June 2024. - If any definitive evidence of MGUS and labs we will add skeletal survey   2.  ESRD on dialysis - Secondary to a long history of diabetes, hypertension, obesity related glomerulopathy. - PLAN: Continue dialysis, nephrology follow-up, and follow-up with transplant team at [facility]   3.  Normocytic anemia: - She did receive 2 units of PRBC during hospitalization in June 2019.  She also received Feraheme while hospitalized in June 2019. - She received Procrit injections in 2019, but for some time after that had improved and was no longer needing them - She was restarted on Procrit when she was placed on dialysis  - Most recent CBC (05/13/2022): Hgb 10.1/MCV 82.3.  Vitamin B12 normal.  Mildly elevated MMA difficult to interpret in setting  of ESRD. - PLAN: Hemoglobin at goal.  Continue with Procrit via nephrologist.   4.  Elevated ferritin - Moderately elevated ferritin since at least 2012, with marked increase from 2021 onwards - Most recent iron panel (05/13/2022): Ferritin elevated at 1222, iron saturation 37%.  CMP shows normal LFTs. - She will intermittently receive IV  iron at dialysis - No family history of hemochromatosis.  Hemochromatosis DNA panel (10/02/2021) negative. - No personal history of liver disease or alcohol use - Ferritin is difficult to interpret marker of iron overload in CKD patients and is highly variable, especially in those with ESRD. - PLAN: Suspect elevated ferritin related to ESRD. - No indication for liver imaging at this time, unless patient has any abnormal LFTs. - Repeat iron panel at follow-up in 4 months, and annually thereafter.    PLAN SUMMARY: >> Labs in 4 months = CBC/D, CMP, light chains, SPEP, immunofixation, LDH, ferritin, iron/TIBC >> OFFICE visit in 4 months (1 week after labs)  (After follow-up in 4 months we will plan on annual follow-up thereafter.)     REVIEW OF SYSTEMS:   Review of Systems  Constitutional:  Positive for fatigue. Negative for appetite change, chills, diaphoresis, fever and unexpected weight change.  HENT:   Negative for lump/mass and nosebleeds.   Eyes:  Negative for eye problems.  Respiratory:  Positive for shortness of breath (with exertion). Negative for cough and hemoptysis.   Cardiovascular:  Negative for chest pain, leg swelling and palpitations.  Gastrointestinal:  Positive for constipation, diarrhea, nausea and vomiting. Negative for abdominal pain and blood in stool.  Genitourinary:  Negative for hematuria.   Skin: Negative.   Neurological:  Positive for dizziness and headaches. Negative for light-headedness.  Hematological:  Does not bruise/bleed easily.  Psychiatric/Behavioral:  Positive for depression. The patient is nervous/anxious.      PHYSICAL EXAM:  ECOG PERFORMANCE STATUS: 2 - Symptomatic, <50% confined to bed  There were no vitals filed for this visit. There were no vitals filed for this visit. Physical Exam Constitutional:      Appearance: Normal appearance. She is obese.  Cardiovascular:     Rate and Rhythm: Normal rate and regular rhythm.     Heart sounds:  Murmur heard.  Pulmonary:     Breath sounds: Normal breath sounds.  Neurological:     General: No focal deficit present.     Mental Status: Mental status is at baseline.  Psychiatric:        Behavior: Behavior normal. Behavior is cooperative.     PAST MEDICAL/SURGICAL HISTORY:  Past Medical History:  Diagnosis Date   Acid reflux    Anemia    Arthritis    Axillary masses    Soft tissue - status post excision   Back pain    COVID-19 virus infection 04/06/2019   Depression    End-stage renal disease ([place])    [facility]Sat  [place] hypertension    History of blood transfusion    History of cardiac catheterization    Normal coronary arteries October 2020   History of claustrophobia    History of pneumonia 2019   Hypoxia 04/03/2019   Mixed hyperlipidemia    Obesity    Pancreatitis    Peritoneal dialysis catheter in place [facility])    Pneumonia due to COVID-19 virus 04/02/2019   Sleep apnea    Noncompliant with CPAP   Type 2 diabetes mellitus ([place])    Past Surgical History:  Procedure Laterality Date  ABDOMINAL AORTOGRAM W/LOWER EXTREMITY N/A 04/30/2022   Procedure: ABDOMINAL AORTOGRAM W/LOWER EXTREMITY;  Surgeon: Cherre Robins, MD;  Location: [place] [facility];  Service: Cardiovascular;  Laterality: N/A;   ABDOMINAL HYSTERECTOMY     AV FISTULA PLACEMENT Left 09/02/2017   Procedure: creation of left arm ARTERIOVENOUS (AV) FISTULA;  Surgeon: Serafina Mitchell, MD;  Location: [facility];  Service: Vascular;  Laterality: Left;   COLONOSCOPY  2008   Dr. Oneida Alar: normal    COLONOSCOPY N/A 12/18/2016   Dr. Oneida Alar: multiple tubular adenomas, internal hemorrhoids. Surveillance in 3 years    ESOPHAGEAL DILATION N/A 10/13/2015   Procedure: ESOPHAGEAL DILATION;  Surgeon: Rogene Houston, MD;  [facility];  Service: Endoscopy;  Laterality: N/A;   ESOPHAGOGASTRODUODENOSCOPY N/A 10/13/2015   Dr. Laural Golden: chronic gastritis on path, no H.pylori. Empiric dilation     ESOPHAGOGASTRODUODENOSCOPY N/A 12/18/2016   Dr. Oneida Alar: mild gastritis. BRAVO study revealed uncontrolled GERD. Dysphagia secondary to uncontrolled reflux   FOOT SURGERY Bilateral    "nerve"     LEFT HEART CATH AND CORONARY ANGIOGRAPHY N/A 12/29/2018   Procedure: LEFT HEART CATH AND CORONARY ANGIOGRAPHY;  Surgeon: Jettie Booze, MD;  Location: [place] [facility];  Service: Cardiovascular;  Laterality: N/A;   LOWER EXTREMITY ANGIOGRAPHY Right 05/04/2022   Procedure: Lower Extremity Angiography;  Surgeon: Broadus John, MD;  Location: [place] [facility];  Service: Cardiovascular;  Laterality: Right;   LUNG BIOPSY     MASS EXCISION Right 01/09/2013   Procedure: EXCISION OF NEOPLASM OF RIGHT  AXILLA  AND EXCISION OF NEOPLASM OF LEFT AXILLA;  Surgeon: Jamesetta So, MD;  Location: [facility];  Service: General;  Laterality: Right;  procedure end @ 08:23   MYRINGOTOMY WITH TUBE PLACEMENT Bilateral 04/28/2017   Procedure: BILATERAL MYRINGOTOMY WITH TUBE PLACEMENT;  Surgeon: Leta Baptist, MD;  Location: [facility];  Service: ENT;  Laterality: Bilateral;   PERIPHERAL VASCULAR BALLOON ANGIOPLASTY Right 05/04/2022   Procedure: PERIPHERAL VASCULAR BALLOON ANGIOPLASTY;  Surgeon: Broadus John, MD;  Location: [facility];  Service: Cardiovascular;  Laterality: Right;  PT   PERIPHERAL VASCULAR INTERVENTION Right 05/04/2022   Procedure: PERIPHERAL VASCULAR INTERVENTION;  Surgeon: Broadus John, MD;  Location: [facility];  Service: Cardiovascular;  Laterality: Right;  SFA   REVISION OF ARTERIOVENOUS GORETEX GRAFT Left 05/04/2018   Procedure: TRANSPOSITION OF CEPHALIC VEIN ARTERIOVENOUS FISTULA LEFT ARM;  Surgeon: Rosetta Posner, MD;  Location[facility];  Service: Vascular;  Laterality: Left;   SAVORY DILATION N/A 12/18/2016   Procedure: SAVORY DILATION;  Surgeon: Danie Binder, MD;  [facility];  Service: Endoscopy;  Laterality: N/A;    SOCIAL HISTORY:  Social History   Socioeconomic  History   Marital status: Married    Spouse name: Verdelle Pipp   Number of children: 2   Years of education: 12   Highest education level: 12th grade  Occupational History   Occupation: retired   Tobacco Use   Smoking status: Never    Passive exposure: Never   Smokeless tobacco: Never   Tobacco comments:    Verified by Daughter, [name]  Vaping Use   Vaping Use: Never used  Substance and Sexual Activity   Alcohol use: No   Drug use: No   Sexual activity: Not Currently    Partners: Male  Other Topics Concern   Not on file  Social History Narrative   Lives alone with husband  Social Determinants of Health   Financial Resource Strain: Medium Risk (05/12/2022)   Overall Financial Resource Strain (CARDIA)    Difficulty of Paying Living Expenses: Somewhat hard  Food Insecurity: No Food Insecurity (05/12/2022)   Hunger Vital Sign    Worried About Running Out of Food in the Last Year: Never true    Ran Out of Food in the Last Year: Never true  Transportation Needs: No Transportation Needs (05/12/2022)   PRAPARE - Hydrologist (Medical): No    Lack of Transportation (Non-Medical): No  Physical Activity: Inactive (05/12/2022)   Exercise Vital Sign    Days of Exercise per Week: 0 days    Minutes of Exercise per Session: 0 min  Stress: No Stress Concern Present (05/12/2022)   [place]    Feeling of Stress : Not at all  Social Connections: [place] (05/12/2022)   Social Connection and Isolation Panel [NHANES]    Frequency of Communication with Friends and Family: More than three times a week    Frequency of Social Gatherings with Friends and Family: More than three times a week    Attends Religious Services: More than 4 times per year    Active Member of [employer] or Organizations: Yes    Attends Archivist Meetings: 1 to 4 times per year    Marital Status:  Married  Human resources officer Violence: Not At Risk (05/12/2022)   Humiliation, Afraid, Rape, and Kick questionnaire    Fear of Current or Ex-Partner: No    Emotionally Abused: No    Physically Abused: No    Sexually Abused: No    FAMILY HISTORY:  Family History  Problem Relation Age of Onset   Hypertension Father    Hypercholesterolemia Father    Arthritis Father    Hypertension Sister    Hypercholesterolemia Sister    Breast cancer Sister    Hypertension Sister    Colon cancer Neg Hx    Colon polyps Neg Hx     CURRENT MEDICATIONS:  Outpatient Encounter Medications as of 05/21/2022  Medication Sig Note   aspirin 81 MG chewable tablet CHEW 2 TABLETS BY MOUTH ONCE DAILY (Patient taking differently: Chew 162 mg by mouth daily.)    azelastine (OPTIVAR) 0.05 % ophthalmic solution Place 1 drop into both eyes 2 (two) times daily.    B Complex-C-Folic Acid (RENA-VITE RX) 1 MG TABS Take 1 tablet by mouth daily.    Blood Glucose Monitoring Suppl (ONETOUCH VERIO) w/Device KIT Use to check blood sugar 4X daily.    calcitRIOL (ROCALTROL) 0.25 MCG capsule Take 1 capsule (0.25 mcg total) by mouth 2 (two) times daily with a meal.    clopidogrel (PLAVIX) 75 MG tablet Take 1 tablet (75 mg total) by mouth daily with breakfast. (Patient not taking: Reported on 05/14/2022)    Continuous Blood Gluc Receiver (FREESTYLE LIBRE 2 READER) DEVI 1 each by Does not apply route daily.    Continuous Blood Gluc Sensor (FREESTYLE LIBRE 2 SENSOR) MISC 1 each by Does not apply route every 14 (fourteen) days.    DULoxetine (CYMBALTA) 60 MG capsule TAKE (1) CAPSULE BY MOUTH ONCE DAILY. (Patient taking differently: Take 60 mg by mouth daily.)    ezetimibe (ZETIA) 10 MG tablet TAKE ONE TABLET BY MOUTH EVERY DAY    glucose blood (ONETOUCH VERIO) test strip Use as instructed to check blood sugar 4X daily.    hydrOXYzine (ATARAX) 25 MG  tablet Take 25 mg by mouth every 12 (twelve) hours as needed for itching.    insulin aspart  (FIASP FLEXTOUCH) 100 UNIT/ML FlexTouch Pen Inject 18-30 Units into the skin with breakfast, with lunch, and with evening meal.    insulin glargine, 2 Unit Dial, (TOUJEO MAX SOLOSTAR) 300 UNIT/ML Solostar Pen Inject 80 Units into the skin daily.    Insulin Pen Needle 32G X 4 MM MISC Use 4x a day    metoprolol succinate (TOPROL-XL) 50 MG 24 hr tablet TAKE (1) TABLET BY MOUTH DAILY WITH FOOD *TAKE AFTER DIALYSIS* (Patient taking differently: Take 50 mg by mouth daily.) 04/25/2022: Patient stated she last took 3-4 days ago.   OneTouch Delica Lancets 99991111 MISC Use to check blood sugar 4X daily.    pregabalin (LYRICA) 50 MG capsule TAKE ONE CAPSULE BY MOUTH three times a day (Patient taking differently: Take 50 mg by mouth 3 (three) times daily.)    sertraline (ZOLOFT) 50 MG tablet TAKE ONE TABLET BY MOUTH EVERY DAY    [name] 500 MG chewable tablet Chew 500-1,000 mg by mouth See admin instructions. Take '1000mg'$  (2 tablets) by mouth with meals and '500mg'$  (1 tablet) with snacks    [DISCONTINUED] FLUoxetine (PROZAC) 10 MG capsule Take 10 mg by mouth daily.      [DISCONTINUED] glipiZIDE (GLUCOTROL) 10 MG tablet Take 10 mg by mouth 2 (two) times daily before a meal.      Facility-Administered Encounter Medications as of 05/21/2022  Medication   0.9 %  sodium chloride infusion    ALLERGIES:  Allergies  Allergen Reactions   Ace Inhibitors Anaphylaxis and Swelling   Penicillins Itching, Swelling and Other (See Comments)    Did it involve swelling of the face/tongue/throat, SOB, or low BP? Unknown Did it involve sudden or severe rash/hives, skin peeling, or any reaction on the inside of your mouth or nose? Unknown Did you need to seek medical attention at a hospital or doctor's office? Unknown When did it last happen?      years  If all above answers are "NO", may proceed with cephalosporin use.    Statins Other (See Comments)    elevated LFT's     Albuterol Swelling    LABORATORY DATA:  I have  reviewed the labs as listed.  CBC    Component Value Date/Time   WBC 8.1 05/13/2022 1153   RBC 4.13 05/13/2022 1153   HGB 10.1 (L) 05/13/2022 1153   HCT 34.0 (L) 05/13/2022 1153   PLT 328 05/13/2022 1153   MCV 82.3 05/13/2022 1153   MCH 24.5 (L) 05/13/2022 1153   MCHC 29.7 (L) 05/13/2022 1153   RDW 19.4 (H) 05/13/2022 1153   LYMPHSABS 1.7 05/13/2022 1153   MONOABS 0.8 05/13/2022 1153   EOSABS 0.1 05/13/2022 1153   BASOSABS 0.1 05/13/2022 1153      Latest Ref Rng & Units 05/13/2022   11:53 AM 05/07/2022    1:01 AM 05/06/2022   12:37 AM  CMP  Glucose 70 - 99 mg/dL 99  183  263   BUN 8 - 23 mg/dL 17  25  51   Creatinine 0.44 - 1.00 mg/dL 3.33  5.19  8.11   Sodium 135 - 145 mmol/L 136  136  136   Potassium 3.5 - 5.1 mmol/L 3.0  3.6  3.6   Chloride 98 - 111 mmol/L 97  94  94   CO2 22 - 32 mmol/L '28  27  26   '$ Calcium 8.9 -  10.3 mg/dL 8.5  9.2  9.2   Total Protein 6.5 - 8.1 g/dL 7.1     Total Bilirubin 0.3 - 1.2 mg/dL 0.6     Alkaline Phos 38 - 126 U/L 90     AST 15 - 41 U/L 26     ALT 0 - 44 U/L 35       DIAGNOSTIC IMAGING:  I have independently reviewed the relevant imaging and discussed with the patient.   WRAP UP:  All questions were answered. The patient knows to call the clinic with any problems, questions or concerns.  Medical decision making: Moderate  Time spent on visit: I spent 20 minutes counseling the patient face to face. The total time spent in the appointment was 30 minutes and more than 50% was on counseling.  Harriett Rush, PA-C  05/21/22 8:43 AM

## 2022-05-21 ENCOUNTER — Inpatient Hospital Stay (HOSPITAL_BASED_OUTPATIENT_CLINIC_OR_DEPARTMENT_OTHER): Payer: Medicare HMO | Admitting: Physician Assistant

## 2022-05-21 ENCOUNTER — Telehealth: Payer: Self-pay | Admitting: Pharmacy Technician

## 2022-05-21 ENCOUNTER — Encounter: Payer: Self-pay | Admitting: Radiology

## 2022-05-21 ENCOUNTER — Other Ambulatory Visit: Payer: Self-pay

## 2022-05-21 VITALS — BP 141/61 | HR 78 | Temp 98.7°F | Resp 17

## 2022-05-21 DIAGNOSIS — N186 End stage renal disease: Secondary | ICD-10-CM

## 2022-05-21 DIAGNOSIS — R7989 Other specified abnormal findings of blood chemistry: Secondary | ICD-10-CM | POA: Diagnosis not present

## 2022-05-21 DIAGNOSIS — Z596 Low income: Secondary | ICD-10-CM

## 2022-05-21 DIAGNOSIS — I70221 Atherosclerosis of native arteries of extremities with rest pain, right leg: Secondary | ICD-10-CM | POA: Diagnosis not present

## 2022-05-21 DIAGNOSIS — Z7982 Long term (current) use of aspirin: Secondary | ICD-10-CM | POA: Diagnosis not present

## 2022-05-21 DIAGNOSIS — I120 Hypertensive chronic kidney disease with stage 5 chronic kidney disease or end stage renal disease: Secondary | ICD-10-CM | POA: Diagnosis not present

## 2022-05-21 DIAGNOSIS — D472 Monoclonal gammopathy: Secondary | ICD-10-CM | POA: Diagnosis not present

## 2022-05-21 DIAGNOSIS — E1140 Type 2 diabetes mellitus with diabetic neuropathy, unspecified: Secondary | ICD-10-CM | POA: Diagnosis not present

## 2022-05-21 DIAGNOSIS — D638 Anemia in other chronic diseases classified elsewhere: Secondary | ICD-10-CM

## 2022-05-21 DIAGNOSIS — G4730 Sleep apnea, unspecified: Secondary | ICD-10-CM | POA: Diagnosis not present

## 2022-05-21 DIAGNOSIS — Z7902 Long term (current) use of antithrombotics/antiplatelets: Secondary | ICD-10-CM | POA: Diagnosis not present

## 2022-05-21 DIAGNOSIS — Z48812 Encounter for surgical aftercare following surgery on the circulatory system: Secondary | ICD-10-CM | POA: Diagnosis not present

## 2022-05-21 DIAGNOSIS — I5032 Chronic diastolic (congestive) heart failure: Secondary | ICD-10-CM | POA: Diagnosis not present

## 2022-05-21 DIAGNOSIS — D631 Anemia in chronic kidney disease: Secondary | ICD-10-CM | POA: Diagnosis not present

## 2022-05-21 DIAGNOSIS — Z992 Dependence on renal dialysis: Secondary | ICD-10-CM | POA: Diagnosis not present

## 2022-05-21 DIAGNOSIS — Z794 Long term (current) use of insulin: Secondary | ICD-10-CM | POA: Diagnosis not present

## 2022-05-21 DIAGNOSIS — E1122 Type 2 diabetes mellitus with diabetic chronic kidney disease: Secondary | ICD-10-CM | POA: Diagnosis not present

## 2022-05-21 DIAGNOSIS — I132 Hypertensive heart and chronic kidney disease with heart failure and with stage 5 chronic kidney disease, or end stage renal disease: Secondary | ICD-10-CM | POA: Diagnosis not present

## 2022-05-21 DIAGNOSIS — E1151 Type 2 diabetes mellitus with diabetic peripheral angiopathy without gangrene: Secondary | ICD-10-CM | POA: Diagnosis not present

## 2022-05-21 NOTE — Progress Notes (Signed)
[facility])                                            [place] Team    05/21/2022  Kathryn Beck 1955/10/10 [id number]                                      Medication Assistance Referral  Referral From: [place]  Medication/Company: [employer] Patient application portion:  Education officer, museum portion: Faxed  to Dr. [name] Provider address/fax verified via: Office website  Medication/Company: [name] Patient application portion:  Education officer, museum portion: Faxed  to Dr. [name] Provider address/fax verified via: Office website   [name] P. Britiny Defrain, [employer]  [phone]

## 2022-05-21 NOTE — Patient Instructions (Signed)
[place] at [facility] Discharge Instructions  You were seen today by Tarri Abernethy PA-C for your elevated iron, anemia, and abnormal protein.  Your iron levels are elevated due to your chronic kidney disease and the iron that you received at dialysis.  Your anemia is being managed very well by your kidney doctor, and your blood levels are at a good level.  FOLLOW-UP APPOINTMENT: Repeat labs in 4 months with office visit the following week.  Thank you for choosing [place] at [facility] to provide your oncology and hematology care.  To afford each patient quality time with our provider, please arrive at least 15 minutes before your scheduled appointment time.   If you have a lab appointment with the [facility] please come in thru the Main Entrance and check in at the main information desk.  You need to re-schedule your appointment should you arrive 10 or more minutes late.  We strive to give you quality time with our providers, and arriving late affects you and other patients whose appointments are after yours.  Also, if you no show three or more times for appointments you may be dismissed from the clinic at the providers discretion.     Again, thank you for choosing [facility].  Our hope is that these requests will decrease the amount of time that you wait before being seen by our physicians.       _____________________________________________________________  Should you have questions after your visit to [facility], please contact our office at [phone] and follow the prompts.  Our office hours are 8:00 a.m. and 4:30 p.m. Monday - Friday.  Please note that voicemails left after 4:00 p.m. may not be returned until the following business day.  We are closed weekends and major holidays.  You do have access to a nurse 24-7, just call the main number to the clinic [phone] and do not press any options,  hold on the line and a nurse will answer the phone.    For prescription refill requests, have your pharmacy contact our office and allow 72 hours.    Due to Covid, you will need to wear a mask upon entering the hospital. If you do not have a mask, a mask will be given to you at the Main Entrance upon arrival. For doctor visits, patients may have 1 support person age 65 or older with them. For treatment visits, patients can not have anyone with them due to social distancing guidelines and our immunocompromised population.

## 2022-05-22 ENCOUNTER — Encounter (HOSPITAL_BASED_OUTPATIENT_CLINIC_OR_DEPARTMENT_OTHER): Payer: Self-pay | Admitting: Pulmonary Disease

## 2022-05-22 ENCOUNTER — Telehealth: Payer: Self-pay | Admitting: Orthopedic Surgery

## 2022-05-22 ENCOUNTER — Ambulatory Visit (HOSPITAL_BASED_OUTPATIENT_CLINIC_OR_DEPARTMENT_OTHER): Payer: Medicare PPO | Admitting: Pulmonary Disease

## 2022-05-22 ENCOUNTER — Encounter (HOSPITAL_COMMUNITY): Payer: Self-pay | Admitting: Hematology

## 2022-05-22 VITALS — BP 130/74 | HR 75 | Ht 64.0 in | Wt 191.0 lb

## 2022-05-22 DIAGNOSIS — G4733 Obstructive sleep apnea (adult) (pediatric): Secondary | ICD-10-CM

## 2022-05-22 DIAGNOSIS — N186 End stage renal disease: Secondary | ICD-10-CM | POA: Diagnosis not present

## 2022-05-22 DIAGNOSIS — N2581 Secondary hyperparathyroidism of renal origin: Secondary | ICD-10-CM | POA: Diagnosis not present

## 2022-05-22 DIAGNOSIS — Z992 Dependence on renal dialysis: Secondary | ICD-10-CM | POA: Diagnosis not present

## 2022-05-22 NOTE — Telephone Encounter (Signed)
Patient's daughter [name] called advised patient is ready to proceed with surgery. The number to contact [name] is [phone]

## 2022-05-22 NOTE — Progress Notes (Signed)
Subjective:    Patient ID: Kathryn Beck, female    DOB: 08/09/1955, 67 y.o.   MRN: [id number]  HPI  Chief Complaint  Patient presents with   sleep consult    Pt states when she wakes up in the mornings, she does not feel like she has slept at all and also states that she has been told that she snores.   67 year old dialysis patient presents for evaluation of excessive daytime somnolence and loud snoring. She was diagnosed with severe sleep apnea in 2020, she tried using CPAP and did not tolerate fullface mask.  She states that she had a machine that was recalled and is not sure if she still has the machine or not.  Husband seems to think that she still has the machine but never got it replaced.  Husband reports loud snoring and restless sleep, she also talks in her sleep.  She reports nonrefreshing sleep. Epworth Sleepiness Scale is 17 and she reports sleepiness while watching TV, as a passenger in a car, lying down to rest in the afternoon or when stopped in traffic. Bedtime is around 9 PM, TV in the bedroom, she sleeps on her side with 2 pillows with her head of bed elevated, reports several nocturnal awakenings and is out of bed around 5 AM on dialysis days and on other days sleeps until 10 AM but still wakes up feeling tired with occasional headache and dryness of mouth. She is lost 30 pounds since her sleep study  There is no history suggestive of cataplexy, sleep paralysis or parasomnias  Significant tests/ events reviewed  NPSG 10/2018 -222 lbs -AHI 72/hour, low desat 83%, no supine sleep  Past Medical History:  Diagnosis Date   Acid reflux    Anemia    Arthritis    Axillary masses    Soft tissue - status post excision   Back pain    COVID-19 virus infection 04/06/2019   Depression    End-stage renal disease ([place])    TTHSat  in [place] hypertension    History of blood transfusion    History of cardiac catheterization    Normal coronary arteries October  2020   History of claustrophobia    History of pneumonia 2019   Hypoxia 04/03/2019   Mixed hyperlipidemia    Obesity    Pancreatitis    Peritoneal dialysis catheter in place [facility])    Pneumonia due to COVID-19 virus 04/02/2019   Sleep apnea    Noncompliant with CPAP   Type 2 diabetes mellitus ([place])    Past Surgical History:  Procedure Laterality Date   ABDOMINAL AORTOGRAM W/LOWER EXTREMITY N/A 04/30/2022   Procedure: ABDOMINAL AORTOGRAM W/LOWER EXTREMITY;  Surgeon: Cherre Robins, MD;  Location: [place] [facility];  Service: Cardiovascular;  Laterality: N/A;   ABDOMINAL HYSTERECTOMY     AV FISTULA PLACEMENT Left 09/02/2017   Procedure: creation of left arm ARTERIOVENOUS (AV) FISTULA;  Surgeon: Serafina Mitchell, MD;  Location: [facility] OR;  Service: Vascular;  Laterality: Left;   COLONOSCOPY  2008   Dr. Oneida Alar: normal    COLONOSCOPY N/A 12/18/2016   Dr. Oneida Alar: multiple tubular adenomas, internal hemorrhoids. Surveillance in 3 years    ESOPHAGEAL DILATION N/A 10/13/2015   Procedure: ESOPHAGEAL DILATION;  Surgeon: Rogene Houston, MD;  [facility];  Service: Endoscopy;  Laterality: N/A;   ESOPHAGOGASTRODUODENOSCOPY N/A 10/13/2015   Dr. Laural Golden: chronic gastritis on path, no H.pylori. Empiric dilation    ESOPHAGOGASTRODUODENOSCOPY  N/A 12/18/2016   Dr. Oneida Alar: mild gastritis. BRAVO study revealed uncontrolled GERD. Dysphagia secondary to uncontrolled reflux   FOOT SURGERY Bilateral    "nerve"     LEFT HEART CATH AND CORONARY ANGIOGRAPHY N/A 12/29/2018   Procedure: LEFT HEART CATH AND CORONARY ANGIOGRAPHY;  Surgeon: Jettie Booze, MD;  Location: [place] [facility];  Service: Cardiovascular;  Laterality: N/A;   LOWER EXTREMITY ANGIOGRAPHY Right 05/04/2022   Procedure: Lower Extremity Angiography;  Surgeon: Broadus John, MD;  Location: [place] [facility];  Service: Cardiovascular;  Laterality: Right;   LUNG BIOPSY     MASS EXCISION Right 01/09/2013   Procedure: EXCISION OF  NEOPLASM OF RIGHT  AXILLA  AND EXCISION OF NEOPLASM OF LEFT AXILLA;  Surgeon: Jamesetta So, MD;  Location: [facility];  Service: General;  Laterality: Right;  procedure end @ 08:23   MYRINGOTOMY WITH TUBE PLACEMENT Bilateral 04/28/2017   Procedure: BILATERAL MYRINGOTOMY WITH TUBE PLACEMENT;  Surgeon: Leta Baptist, MD;  Location: [place];  Service: ENT;  Laterality: Bilateral;   PERIPHERAL VASCULAR BALLOON ANGIOPLASTY Right 05/04/2022   Procedure: PERIPHERAL VASCULAR BALLOON ANGIOPLASTY;  Surgeon: Broadus John, MD;  Location: [place] [facility];  Service: Cardiovascular;  Laterality: Right;  PT   PERIPHERAL VASCULAR INTERVENTION Right 05/04/2022   Procedure: PERIPHERAL VASCULAR INTERVENTION;  Surgeon: Broadus John, MD;  Location: [place] [facility];  Service: Cardiovascular;  Laterality: Right;  SFA   REVISION OF ARTERIOVENOUS GORETEX GRAFT Left 05/04/2018   Procedure: TRANSPOSITION OF CEPHALIC VEIN ARTERIOVENOUS FISTULA LEFT ARM;  Surgeon: Rosetta Posner, MD;  [facility];  Service: Vascular;  Laterality: Left;   SAVORY DILATION N/A 12/18/2016   Procedure: SAVORY DILATION;  Surgeon: Danie Binder, MD;  [facility];  Service: Endoscopy;  Laterality: N/A;    Allergies  Allergen Reactions   Ace Inhibitors Anaphylaxis and Swelling   Penicillins Itching, Swelling and Other (See Comments)    Did it involve swelling of the face/tongue/throat, SOB, or low BP? Unknown Did it involve sudden or severe rash/hives, skin peeling, or any reaction on the inside of your mouth or nose? Unknown Did you need to seek medical attention at a hospital or doctor's office? Unknown When did it last happen?      years  If all above answers are "NO", may proceed with cephalosporin use.    Statins Other (See Comments)    elevated LFT's     Albuterol Swelling    Social History   Socioeconomic History   Marital status: Married    Spouse name: Jeffifer Oneil   Number of children: 2   Years of education:  12   Highest education level: 12th grade  Occupational History   Occupation: retired   Tobacco Use   Smoking status: Never    Passive exposure: Never   Smokeless tobacco: Never   Tobacco comments:    Verified by Daughter, [name]  Vaping Use   Vaping Use: Never used  Substance and Sexual Activity   Alcohol use: No   Drug use: No   Sexual activity: Not Currently    Partners: Male  Other Topics Concern   Not on file  Social History Narrative   Lives alone with husband    Social Determinants of Health   Financial Resource Strain: Medium Risk (05/12/2022)   Overall Financial Resource Strain (CARDIA)    Difficulty of Paying Living Expenses: Somewhat hard  Food Insecurity: No Food Insecurity (05/12/2022)  Hunger Vital Sign    Worried About Running Out of Food in the Last Year: Never true    Ran Out of Food in the Last Year: Never true  Transportation Needs: No Transportation Needs (05/12/2022)   PRAPARE - Hydrologist (Medical): No    Lack of Transportation (Non-Medical): No  Physical Activity: Inactive (05/12/2022)   Exercise Vital Sign    Days of Exercise per Week: 0 days    Minutes of Exercise per Session: 0 min  Stress: No Stress Concern Present (05/12/2022)   [place]    Feeling of Stress : Not at all  Social Connections: [place] (05/12/2022)   Social Connection and Isolation Panel [NHANES]    Frequency of Communication with Friends and Family: More than three times a week    Frequency of Social Gatherings with Friends and Family: More than three times a week    Attends Religious Services: More than 4 times per year    Active Member of [employer] or Organizations: Yes    Attends Archivist Meetings: 1 to 4 times per year    Marital Status: Married  Human resources officer Violence: Not At Risk (05/12/2022)   Humiliation, Afraid, Rape, and Kick questionnaire     Fear of Current or Ex-Partner: No    Emotionally Abused: No    Physically Abused: No    Sexually Abused: No    Family History  Problem Relation Age of Onset   Hypertension Father    Hypercholesterolemia Father    Arthritis Father    Hypertension Sister    Hypercholesterolemia Sister    Breast cancer Sister    Hypertension Sister    Colon cancer Neg Hx    Colon polyps Neg Hx      Review of Systems Constitutional: negative for anorexia, fevers and sweats  Eyes: negative for irritation, redness and visual disturbance  Ears, nose, mouth, throat, and face: negative for earaches, epistaxis, nasal congestion and sore throat  Respiratory: negative for cough, dyspnea on exertion, sputum and wheezing  Cardiovascular: negative for chest pain, dyspnea, lower extremity edema, orthopnea, palpitations and syncope  Gastrointestinal: negative for abdominal pain, constipation, diarrhea, melena, nausea and vomiting  Genitourinary:negative for dysuria, frequency and hematuria  Hematologic/lymphatic: negative for bleeding, easy bruising and lymphadenopathy  Musculoskeletal:negative for arthralgias, muscle weakness and stiff joints  Neurological: negative for coordination problems, gait problems, headaches and weakness  Endocrine: negative for diabetic symptoms including polydipsia, polyuria and weight loss     Objective:   Physical Exam  Gen. Pleasant, obese, in no distress, normal affect ENT - no pallor,icterus, no post nasal drip, class 2-3 airway, large tongue Neck: No JVD, no thyromegaly, no carotid bruits Lungs: no use of accessory muscles, no dullness to percussion, decreased without rales or rhonchi  Cardiovascular: Rhythm regular, heart sounds  normal, no murmurs or gallops, no peripheral edema Abdomen: soft and non-tender, no hepatosplenomegaly, BS normal. Musculoskeletal: No deformities, no cyanosis or clubbing, left arm AV fistula with thrill Neuro:  alert, non focal, no  tremors       Assessment & Plan:

## 2022-05-22 NOTE — Telephone Encounter (Signed)
Pt was here on 05/12/22, note not yet dictated. Will check in with Dr. Sharol Given on Monday and get surgical sheet to [facility] for scheduling.

## 2022-05-22 NOTE — Assessment & Plan Note (Signed)
She had severe sleep apnea in 2020.  She has lost 30 pounds since then after starting dialysis but still has symptoms of severe OSA.  She was not able to tolerate full facemask back then.  She is not sure if she still has the [facility] machine but did not get a replacement machine during the recall. We will go ahead and proceed with a CPAP titration study hopefully this will also give an opportunity to perform mask desensitization to see if she will tolerate CPAP better this time. She does not seem to have a positional component and I doubt that oral appliance would suffice to treat this degree of sleep disordered breathing

## 2022-05-22 NOTE — Patient Instructions (Addendum)
  X CPAP titration study @ [facility]  Based on this, we will get you a new machine / mask  Please contact the [facility] team directly on [phone] for more assistance if you find your old machine

## 2022-05-25 ENCOUNTER — Encounter: Payer: Self-pay | Admitting: Orthopedic Surgery

## 2022-05-25 ENCOUNTER — Other Ambulatory Visit: Payer: Self-pay

## 2022-05-25 DIAGNOSIS — N2581 Secondary hyperparathyroidism of renal origin: Secondary | ICD-10-CM | POA: Diagnosis not present

## 2022-05-25 DIAGNOSIS — N186 End stage renal disease: Secondary | ICD-10-CM | POA: Diagnosis not present

## 2022-05-25 DIAGNOSIS — Z992 Dependence on renal dialysis: Secondary | ICD-10-CM | POA: Diagnosis not present

## 2022-05-25 DIAGNOSIS — E1122 Type 2 diabetes mellitus with diabetic chronic kidney disease: Secondary | ICD-10-CM

## 2022-05-25 MED ORDER — AZELASTINE HCL 0.05 % OP SOLN: 1.0000 [drp] | Freq: Two times a day (BID) | OPHTHALMIC | 2 refills | Status: DC

## 2022-05-25 MED ORDER — TOUJEO MAX SOLOSTAR 300 UNIT/ML ~~LOC~~ SOPN: 80.0000 [IU] | PEN_INJECTOR | Freq: Every day | SUBCUTANEOUS | 3 refills | Status: DC

## 2022-05-25 MED ORDER — FIASP FLEXTOUCH 100 UNIT/ML ~~LOC~~ SOPN: 18.0000 [IU] | PEN_INJECTOR | Freq: Three times a day (TID) | SUBCUTANEOUS | 3 refills | Status: DC

## 2022-05-25 MED ORDER — EZETIMIBE 10 MG PO TABS: 10.0000 mg | ORAL_TABLET | Freq: Every day | ORAL | 11 refills | Status: DC

## 2022-05-25 MED ORDER — SERTRALINE HCL 50 MG PO TABS: 50.0000 mg | ORAL_TABLET | Freq: Every day | ORAL | 0 refills | Status: DC

## 2022-05-25 MED ORDER — INSULIN PEN NEEDLE 32G X 4 MM MISC: 3 refills | Status: DC

## 2022-05-25 NOTE — Progress Notes (Signed)
Office Visit Note   Patient: Kathryn Beck           Date of Birth: Jan 25, 1956           MRN: [id number] Visit Date: 05/12/2022              Requested by: Fayrene Helper, MD [address], [place],  [place] [address] PCP: Fayrene Helper, MD  Chief Complaint  Patient presents with   Right Foot - Wound Check    Gangrene right great toe      HPI: Patient is a 67 year old woman with gangrene of the right foot with peripheral vascular disease.  She has undergone catheterization of the lower extremity in December 12.  She has had duplex scans of both lower extremities February 9.  She is currently using Betadine and dry dressing changes.  Assessment & Plan: Visit Diagnoses:  1. Gangrene of right foot ([place])     Plan: Discussed treatment options.  Patient and family state they would like to proceed with 1 surgery only.  Discussed that she should not have the best healing with a transtibial amputation.  Recommended protein supplements twice a day.  Patient and family state they understand and wish to proceed with surgery at this time.  Follow-Up Instructions: Return if symptoms worsen or fail to improve.   Ortho Exam  Patient is alert, oriented, no adenopathy, well-dressed, normal affect, normal respiratory effort. Examination patient with the Doppler has a strong biphasic anterior tibial pulse and a weak biphasic dorsalis pedis pulse.  She has dry gangrenous changes of the right great toe.  Patient does not have revascularization options.  Imaging: No results found. No images are attached to the encounter.  Labs: Lab Results  Component Value Date   HGBA1C 9.3 (A) 04/16/2022   HGBA1C 8.9 (A) 10/28/2021   HGBA1C 9.1 (A) 07/22/2021   ESRSEDRATE 37 (H) 04/25/2022   ESRSEDRATE 27 (H) 04/24/2008   CRP 29.4 (H) 04/25/2022   CRP 20.1 (H) 12/02/2020   CRP 6.5 (H) 04/07/2019   LABURIC 1.8 (L) 06/07/2018   LABURIC 6.2 04/22/2018   LABURIC 9.3 (H) 10/07/2015    REPTSTATUS 11/09/2017 FINAL 11/07/2017   GRAMSTAIN No WBC Seen 04/03/2014   GRAMSTAIN Rare Squamous Epithelial Cells Present 04/03/2014   GRAMSTAIN Few Gram Positive Cocci In Pairs 04/03/2014   GRAMSTAIN Few Gram Negative Rods 04/03/2014   CULT (A) 11/07/2017    <10,000 COLONIES/mL INSIGNIFICANT GROWTH Performed at [facility] [address], [place], [place] [address]    LABORGA Normal Oropharyngeal Flora 04/03/2014     Lab Results  Component Value Date   ALBUMIN 3.2 (L) 05/13/2022   ALBUMIN 2.8 (L) 05/07/2022   ALBUMIN 2.7 (L) 05/06/2022   PREALBUMIN 24 04/25/2022    Lab Results  Component Value Date   MG 1.9 04/30/2022   MG 2.3 04/29/2022   MG 2.1 04/28/2022   Lab Results  Component Value Date   VD25OH 30.9 09/28/2017   VD25OH 15 (L) 10/07/2015   VD25OH 16 (L) 04/25/2015    Lab Results  Component Value Date   PREALBUMIN 24 04/25/2022      Latest Ref Rng & Units 05/13/2022   11:53 AM 05/06/2022   12:24 PM 05/04/2022    1:15 AM  CBC EXTENDED  WBC 4.0 - 10.5 K/uL 8.1  10.7  8.7   RBC 3.87 - 5.11 MIL/uL 4.13  4.03  4.39   Hemoglobin 12.0 - 15.0 g/dL  10.1  10.1  10.7   HCT 36.0 - 46.0 % 34.0  32.0  35.1   Platelets 150 - 400 K/uL 328  243  213   NEUT# 1.7 - 7.7 K/uL 5.3     Lymph# 0.7 - 4.0 K/uL 1.7        There is no height or weight on file to calculate BMI.  Orders:  No orders of the defined types were placed in this encounter.  No orders of the defined types were placed in this encounter.    Procedures: No procedures performed  Clinical Data: No additional findings.  ROS:  All other systems negative, except as noted in the HPI. Review of Systems  Objective: Vital Signs: There were no vitals taken for this visit.  Specialty Comments:  No specialty comments available.  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[place]) 04/25/2022   Gangrene of toe of right foot ([place]) 04/25/2022   PAD (peripheral artery disease) ([place]) 04/25/2022   Other osteoporosis without current pathological fracture 11/10/2021   Hypocalcemia 11/10/2021   Fall 11/02/2021   Left foot pain 10/31/2021   Low back pain with left-sided sciatica 10/31/2021   Left ear impacted cerumen 09/18/2021   Chronic left shoulder pain 06/10/2021   Abnormal CXR 03/10/2021   Ear pain, bilateral 03/10/2021   Morbid obesity ([place]) 03/10/2021   Subungual hematoma of second toe of left foot 10/28/2020   Insomnia 10/28/2020   Memory loss or impairment 09/26/2020   Forgetfulness 09/26/2020   Recurrent vertigo 09/26/2020   Abnormal MRI, cervical spine 03/04/2020   Right leg weakness 02/28/2020   Knee pain, right 10/23/2019   Cough 08/09/2019   Carpal tunnel syndrome 06/13/2019   Chronic pain syndrome 06/13/2019   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[place]) 06/13/2019   Neck pain 03/23/2019   Near syncope 12/05/2018   ESRD on hemodialysis ([place]) 11/22/2018   Ischemic heart disease 09/25/2018   Not currently working due to disabled status 06/13/2018   ESRD on dialysis ([place]) 06/12/2018   HTN (hypertension) 06/12/2018   Depression 03/09/2018   Adrenal mass, left ([place]) 11/09/2017   MGUS (monoclonal gammopathy of unknown significance) 11/05/2017   Shoulder pain 11/01/2017   Chronic diastolic (congestive) heart failure ([place]) 09/20/2017   Bilateral leg weakness 09/09/2017   Adrenal adenoma 09/03/2017   Uncontrolled type 2 diabetes mellitus 08/18/2017   Unsteady gait 07/11/2017   Recurrent falls 07/11/2017   Anemia of chronic disease 03/24/2017   Personal history of colonic polyps 02/10/2017   Dysphagia 11/05/2016   Irritable bowel syndrome 02/04/2016   Hospital discharge follow-up 10/27/2015   Intolerant of heat 10/07/2014   Cardiac  murmur 01/17/2014   Back pain with left-sided radiculopathy 09/18/2013   Seasonal allergies 03/10/2013   Lipoma of back 12/22/2012   Other seasonal allergic rhinitis 08/22/2012   OSA (obstructive sleep apnea) 06/02/2011   Chronic kidney disease, stage 4 (severe) ([place]) 01/13/2011   Neuropathy 04/16/2010   Malaise and fatigue 07/24/2009   Personal history of other diseases of the nervous system and sense organs 05/08/2009   LUPUS ERYTHEMATOSUS, DISCOID 06/05/2008   Arm pain 05/30/2008   Hyperlipidemia 06/07/2007   Obesity 06/07/2007   Malignant hypertension 06/07/2007   Type 2 diabetes mellitus with hyperglycemia ([place]) 06/07/2007  Diabetes mellitus ([place]) 06/07/2007   Past Medical History:  Diagnosis Date   Acid reflux    Anemia    Arthritis    Axillary masses    Soft tissue - status post excision   Back pain    COVID-19 virus infection 04/06/2019   Depression    End-stage renal disease ([place])    TTHSat  in [place] hypertension    History of blood transfusion    History of cardiac catheterization    Normal coronary arteries October 2020   History of claustrophobia    History of pneumonia 2019   Hypoxia 04/03/2019   Mixed hyperlipidemia    Obesity    Pancreatitis    Peritoneal dialysis catheter in place [facility])    Pneumonia due to COVID-19 virus 04/02/2019   Sleep apnea    Noncompliant with CPAP   Type 2 diabetes mellitus ([place])     Family History  Problem Relation Age of Onset   Hypertension Father    Hypercholesterolemia Father    Arthritis Father    Hypertension Sister    Hypercholesterolemia Sister    Breast cancer Sister    Hypertension Sister    Colon cancer Neg Hx    Colon polyps Neg Hx     Past Surgical History:  Procedure Laterality Date   ABDOMINAL AORTOGRAM W/LOWER EXTREMITY N/A 04/30/2022   Procedure: ABDOMINAL AORTOGRAM W/LOWER EXTREMITY;  Surgeon: Cherre Robins, MD;  Location: [place] [facility];  Service: Cardiovascular;  Laterality:  N/A;   ABDOMINAL HYSTERECTOMY     AV FISTULA PLACEMENT Left 09/02/2017   Procedure: creation of left arm ARTERIOVENOUS (AV) FISTULA;  Surgeon: Serafina Mitchell, MD;  Location: [facility] OR;  Service: Vascular;  Laterality: Left;   COLONOSCOPY  2008   Dr. Oneida Alar: normal    COLONOSCOPY N/A 12/18/2016   Dr. Oneida Alar: multiple tubular adenomas, internal hemorrhoids. Surveillance in 3 years    ESOPHAGEAL DILATION N/A 10/13/2015   Procedure: ESOPHAGEAL DILATION;  Surgeon: Rogene Houston, MD;  [facility];  Service: Endoscopy;  Laterality: N/A;   ESOPHAGOGASTRODUODENOSCOPY N/A 10/13/2015   Dr. Laural Golden: chronic gastritis on path, no H.pylori. Empiric dilation    ESOPHAGOGASTRODUODENOSCOPY N/A 12/18/2016   Dr. Oneida Alar: mild gastritis. BRAVO study revealed uncontrolled GERD. Dysphagia secondary to uncontrolled reflux   FOOT SURGERY Bilateral    "nerve"     LEFT HEART CATH AND CORONARY ANGIOGRAPHY N/A 12/29/2018   Procedure: LEFT HEART CATH AND CORONARY ANGIOGRAPHY;  Surgeon: Jettie Booze, MD;  Location: [place] [facility];  Service: Cardiovascular;  Laterality: N/A;   LOWER EXTREMITY ANGIOGRAPHY Right 05/04/2022   Procedure: Lower Extremity Angiography;  Surgeon: Broadus John, MD;  Location: [place] [facility];  Service: Cardiovascular;  Laterality: Right;   LUNG BIOPSY     MASS EXCISION Right 01/09/2013   Procedure: EXCISION OF NEOPLASM OF RIGHT  AXILLA  AND EXCISION OF NEOPLASM OF LEFT AXILLA;  Surgeon: Jamesetta So, MD;  Location[facility];  Service: General;  Laterality: Right;  procedure end @ 08:23   MYRINGOTOMY WITH TUBE PLACEMENT Bilateral 04/28/2017   Procedure: BILATERAL MYRINGOTOMY WITH TUBE PLACEMENT;  Surgeon: Leta Baptist, MD;  Location: [place];  Service: ENT;  Laterality: Bilateral;   PERIPHERAL VASCULAR BALLOON ANGIOPLASTY Right 05/04/2022   Procedure: PERIPHERAL VASCULAR BALLOON ANGIOPLASTY;  Surgeon: Broadus John, MD;  Location: [place] [facility];  Service: Cardiovascular;   Laterality: Right;  PT   PERIPHERAL VASCULAR INTERVENTION Right  05/04/2022   Procedure: PERIPHERAL VASCULAR INTERVENTION;  Surgeon: Broadus John, MD;  Location: [place] [facility];  Service: Cardiovascular;  Laterality: Right;  SFA   REVISION OF ARTERIOVENOUS GORETEX GRAFT Left 05/04/2018   Procedure: TRANSPOSITION OF CEPHALIC VEIN ARTERIOVENOUS FISTULA LEFT ARM;  Surgeon: Rosetta Posner, MD;  Location[facility];  Service: Vascular;  Laterality: Left;   SAVORY DILATION N/A 12/18/2016   Procedure: SAVORY DILATION;  Surgeon: Danie Binder, MD;  [facility];  Service: Endoscopy;  Laterality: N/A;   Social History   Occupational History   Occupation: retired   Tobacco Use   Smoking status: Never    Passive exposure: Never   Smokeless tobacco: Never   Tobacco comments:    Verified by Daughter, [name]  Vaping Use   Vaping Use: Never used  Substance and Sexual Activity   Alcohol use: No   Drug use: No   Sexual activity: Not Currently    Partners: Male

## 2022-05-26 ENCOUNTER — Telehealth: Payer: Self-pay

## 2022-05-26 ENCOUNTER — Encounter (HOSPITAL_COMMUNITY): Payer: Self-pay | Admitting: Hematology

## 2022-05-26 DIAGNOSIS — I5032 Chronic diastolic (congestive) heart failure: Secondary | ICD-10-CM | POA: Diagnosis not present

## 2022-05-26 DIAGNOSIS — Z48812 Encounter for surgical aftercare following surgery on the circulatory system: Secondary | ICD-10-CM | POA: Diagnosis not present

## 2022-05-26 DIAGNOSIS — N186 End stage renal disease: Secondary | ICD-10-CM | POA: Diagnosis not present

## 2022-05-26 DIAGNOSIS — I70221 Atherosclerosis of native arteries of extremities with rest pain, right leg: Secondary | ICD-10-CM | POA: Diagnosis not present

## 2022-05-26 DIAGNOSIS — E1151 Type 2 diabetes mellitus with diabetic peripheral angiopathy without gangrene: Secondary | ICD-10-CM | POA: Diagnosis not present

## 2022-05-26 DIAGNOSIS — E1140 Type 2 diabetes mellitus with diabetic neuropathy, unspecified: Secondary | ICD-10-CM | POA: Diagnosis not present

## 2022-05-26 DIAGNOSIS — E1122 Type 2 diabetes mellitus with diabetic chronic kidney disease: Secondary | ICD-10-CM | POA: Diagnosis not present

## 2022-05-26 DIAGNOSIS — I132 Hypertensive heart and chronic kidney disease with heart failure and with stage 5 chronic kidney disease, or end stage renal disease: Secondary | ICD-10-CM | POA: Diagnosis not present

## 2022-05-26 DIAGNOSIS — G4730 Sleep apnea, unspecified: Secondary | ICD-10-CM | POA: Diagnosis not present

## 2022-05-26 NOTE — Telephone Encounter (Signed)
I called and left a message for Kathryn Beck to to please return my call to discuss scheduling.

## 2022-05-26 NOTE — Telephone Encounter (Signed)
PA request received via CMM for Calcitriol 0.25MCG capsules  PA has been submitted to [facility] and is pending determination.   Key: [id number]

## 2022-05-27 ENCOUNTER — Other Ambulatory Visit: Payer: Self-pay | Admitting: "Endocrinology

## 2022-05-27 DIAGNOSIS — F325 Major depressive disorder, single episode, in full remission: Secondary | ICD-10-CM

## 2022-05-27 DIAGNOSIS — N186 End stage renal disease: Secondary | ICD-10-CM | POA: Diagnosis not present

## 2022-05-27 DIAGNOSIS — E669 Obesity, unspecified: Secondary | ICD-10-CM

## 2022-05-27 DIAGNOSIS — I132 Hypertensive heart and chronic kidney disease with heart failure and with stage 5 chronic kidney disease, or end stage renal disease: Secondary | ICD-10-CM | POA: Diagnosis not present

## 2022-05-27 DIAGNOSIS — E785 Hyperlipidemia, unspecified: Secondary | ICD-10-CM

## 2022-05-27 DIAGNOSIS — Z48812 Encounter for surgical aftercare following surgery on the circulatory system: Secondary | ICD-10-CM | POA: Diagnosis not present

## 2022-05-27 DIAGNOSIS — Z7982 Long term (current) use of aspirin: Secondary | ICD-10-CM

## 2022-05-27 DIAGNOSIS — E1140 Type 2 diabetes mellitus with diabetic neuropathy, unspecified: Secondary | ICD-10-CM | POA: Diagnosis not present

## 2022-05-27 DIAGNOSIS — E1122 Type 2 diabetes mellitus with diabetic chronic kidney disease: Secondary | ICD-10-CM | POA: Diagnosis not present

## 2022-05-27 DIAGNOSIS — E1151 Type 2 diabetes mellitus with diabetic peripheral angiopathy without gangrene: Secondary | ICD-10-CM | POA: Diagnosis not present

## 2022-05-27 DIAGNOSIS — N2581 Secondary hyperparathyroidism of renal origin: Secondary | ICD-10-CM | POA: Diagnosis not present

## 2022-05-27 DIAGNOSIS — G4730 Sleep apnea, unspecified: Secondary | ICD-10-CM | POA: Diagnosis not present

## 2022-05-27 DIAGNOSIS — Z6828 Body mass index (BMI) 28.0-28.9, adult: Secondary | ICD-10-CM

## 2022-05-27 DIAGNOSIS — I70221 Atherosclerosis of native arteries of extremities with rest pain, right leg: Secondary | ICD-10-CM | POA: Diagnosis not present

## 2022-05-27 DIAGNOSIS — I5032 Chronic diastolic (congestive) heart failure: Secondary | ICD-10-CM | POA: Diagnosis not present

## 2022-05-27 DIAGNOSIS — Z992 Dependence on renal dialysis: Secondary | ICD-10-CM | POA: Diagnosis not present

## 2022-05-27 NOTE — Telephone Encounter (Signed)
PA has been DENIED due to:   You asked for a drug that is used for end stage renal disease (ESRD) and dialysis. The Medicare rule in the Prescription Drug Benefit Manual (Chapter 6, Section 20.2) says that drugs covered under the Part B (medical) benefit cannot be covered under Part D. The Medicare rule in the Medicare Benefit Policy Manual (Chapter 11, Section 20.3) says all drugs used for the treatment of ESRD are included in a Part B bundled payment to the dialysis facility and are not separately paid. This rule applies even if you are not using the drug to treat your kidney disease. This drug cannot be billed separately under your Part D benefit.

## 2022-05-28 ENCOUNTER — Ambulatory Visit (HOSPITAL_BASED_OUTPATIENT_CLINIC_OR_DEPARTMENT_OTHER): Payer: Medicare PPO

## 2022-05-28 ENCOUNTER — Encounter (HOSPITAL_COMMUNITY): Payer: Self-pay | Admitting: Orthopedic Surgery

## 2022-05-28 ENCOUNTER — Ambulatory Visit (INDEPENDENT_AMBULATORY_CARE_PROVIDER_SITE_OTHER): Payer: Medicare PPO | Admitting: Orthopedic Surgery

## 2022-05-28 ENCOUNTER — Other Ambulatory Visit: Payer: Self-pay

## 2022-05-28 ENCOUNTER — Encounter: Payer: Self-pay | Admitting: Orthopedic Surgery

## 2022-05-28 DIAGNOSIS — Z794 Long term (current) use of insulin: Secondary | ICD-10-CM

## 2022-05-28 DIAGNOSIS — I10 Essential (primary) hypertension: Secondary | ICD-10-CM

## 2022-05-28 DIAGNOSIS — I6523 Occlusion and stenosis of bilateral carotid arteries: Secondary | ICD-10-CM | POA: Diagnosis not present

## 2022-05-28 DIAGNOSIS — E1151 Type 2 diabetes mellitus with diabetic peripheral angiopathy without gangrene: Secondary | ICD-10-CM

## 2022-05-28 DIAGNOSIS — E782 Mixed hyperlipidemia: Secondary | ICD-10-CM

## 2022-05-28 DIAGNOSIS — Z992 Dependence on renal dialysis: Secondary | ICD-10-CM

## 2022-05-28 DIAGNOSIS — R011 Cardiac murmur, unspecified: Secondary | ICD-10-CM | POA: Diagnosis not present

## 2022-05-28 DIAGNOSIS — N186 End stage renal disease: Secondary | ICD-10-CM

## 2022-05-28 DIAGNOSIS — I96 Gangrene, not elsewhere classified: Secondary | ICD-10-CM

## 2022-05-28 LAB — ECHOCARDIOGRAM COMPLETE
AR max vel: 1.76 cm2
AV Area VTI: 1.68 cm2
AV Area mean vel: 1.47 cm2
AV Mean grad: 13 mmHg
AV Peak grad: 21.2 mmHg
Ao pk vel: 2.3 m/s
Area-P 1/2: 5.13 cm2
MV M vel: 4.55 m/s
MV Peak grad: 82.8 mmHg
MV VTI: 2.09 cm2
S' Lateral: 2.37 cm

## 2022-05-28 MED ORDER — TRANEXAMIC ACID 1000 MG/10ML IV SOLN
2000.0000 mg | INTRAVENOUS | Status: DC
  Filled 2022-05-28 (×2): qty 20

## 2022-05-28 NOTE — Progress Notes (Signed)
Office Visit Note   Patient: Kathryn Beck           Date of Birth: 01/13/1956           MRN: [id number] Visit Date: 05/28/2022              Requested by: Fayrene Helper, MD [address] [place],  [place] [address] PCP: Fayrene Helper, MD  Chief Complaint  Patient presents with   Right Leg - Follow-up    Discuss surgery for Friday       HPI: Patient is a 67 year old woman who is seen in follow-up for gangrene of the right foot.  She is status post vascular evaluation and intervention.  Assessment & Plan: Visit Diagnoses:  1. Gangrene of right foot ([place])     Plan: Discussed with family treatment options including a transmetatarsal amputation with the increased risk of not healing and need for additional surgery versus a transtibial amputation with a lower risk of the wound not healing approximately 10%.  Patient and family state they would like to proceed with 1 surgery.  Will plan for a right transtibial amputation tomorrow anticipate discharge based on therapy recommendations with inpatient versus outpatient rehab.  Will need to move her dialysis to Saturday after surgery.  Follow-Up Instructions: Return if symptoms worsen or fail to improve.   Ortho Exam  Patient is alert, oriented, no adenopathy, well-dressed, normal affect, normal respiratory effort. Examination patient has a gangrenous right great toe.  There is no ascending cellulitis.  Vascular studies shows diminished circulation below the knee with no inline flow to the foot.  Discussed that the amputation would be performed 12 cm distal to the tibial tubercle.  Most recent hemoglobin A1c 9.3.  Imaging: No results found. No images are attached to the encounter.  Labs: Lab Results  Component Value Date   HGBA1C 9.3 (A) 04/16/2022   HGBA1C 8.9 (A) 10/28/2021   HGBA1C 9.1 (A) 07/22/2021   ESRSEDRATE 37 (H) 04/25/2022   ESRSEDRATE 27 (H) 04/24/2008   CRP 29.4 (H) 04/25/2022   CRP 20.1 (H)  12/02/2020   CRP 6.5 (H) 04/07/2019   LABURIC 1.8 (L) 06/07/2018   LABURIC 6.2 04/22/2018   LABURIC 9.3 (H) 10/07/2015   REPTSTATUS 11/09/2017 FINAL 11/07/2017   GRAMSTAIN No WBC Seen 04/03/2014   GRAMSTAIN Rare Squamous Epithelial Cells Present 04/03/2014   GRAMSTAIN Few Gram Positive Cocci In Pairs 04/03/2014   GRAMSTAIN Few Gram Negative Rods 04/03/2014   CULT (A) 11/07/2017    <10,000 COLONIES/mL INSIGNIFICANT GROWTH Performed at [facility] [address], [place], [place] [address]    LABORGA Normal Oropharyngeal Flora 04/03/2014     Lab Results  Component Value Date   ALBUMIN 3.2 (L) 05/13/2022   ALBUMIN 2.8 (L) 05/07/2022   ALBUMIN 2.7 (L) 05/06/2022   PREALBUMIN 24 04/25/2022    Lab Results  Component Value Date   MG 1.9 04/30/2022   MG 2.3 04/29/2022   MG 2.1 04/28/2022   Lab Results  Component Value Date   VD25OH 30.9 09/28/2017   VD25OH 15 (L) 10/07/2015   VD25OH 16 (L) 04/25/2015    Lab Results  Component Value Date   PREALBUMIN 24 04/25/2022      Latest Ref Rng & Units 05/13/2022   11:53 AM 05/06/2022   12:24 PM 05/04/2022    1:15 AM  CBC EXTENDED  WBC 4.0 - 10.5 K/uL 8.1  10.7  8.7   RBC  3.87 - 5.11 MIL/uL 4.13  4.03  4.39   Hemoglobin 12.0 - 15.0 g/dL 10.1  10.1  10.7   HCT 36.0 - 46.0 % 34.0  32.0  35.1   Platelets 150 - 400 K/uL 328  243  213   NEUT# 1.7 - 7.7 K/uL 5.3     Lymph# 0.7 - 4.0 K/uL 1.7        There is no height or weight on file to calculate BMI.  Orders:  No orders of the defined types were placed in this encounter.  No orders of the defined types were placed in this encounter.    Procedures: No procedures performed  Clinical Data: No additional findings.  ROS:  All other systems negative, except as noted in the HPI. Review of Systems  Objective: Vital Signs: There were no vitals taken for this visit.  Specialty Comments:  No specialty comments available.  PMFS History: Patient Active Problem  List   Diagnosis Date Noted   Confusion 05/19/2022   Visual hallucinations 05/19/2022   Watery eyes 05/19/2022   Requires assistance with activities of daily living (ADL) 04/26/2022   Diabetic wet gangrene of the foot ([place]) 04/25/2022   Gangrene of toe of right foot ([place]) 04/25/2022   PAD (peripheral artery disease) ([place]) 04/25/2022   Other osteoporosis without current pathological fracture 11/10/2021   Hypocalcemia 11/10/2021   Fall 11/02/2021   Left foot pain 10/31/2021   Low back pain with left-sided sciatica 10/31/2021   Left ear impacted cerumen 09/18/2021   Chronic left shoulder pain 06/10/2021   Abnormal CXR 03/10/2021   Ear pain, bilateral 03/10/2021   Morbid obesity ([place]) 03/10/2021   Subungual hematoma of second toe of left foot 10/28/2020   Insomnia 10/28/2020   Memory loss or impairment 09/26/2020   Forgetfulness 09/26/2020   Recurrent vertigo 09/26/2020   Abnormal MRI, cervical spine 03/04/2020   Right leg weakness 02/28/2020   Knee pain, right 10/23/2019   Cough 08/09/2019   Carpal tunnel syndrome 06/13/2019   Chronic pain syndrome 06/13/2019   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[place]) 06/13/2019   Neck pain 03/23/2019   Near syncope 12/05/2018   ESRD on hemodialysis ([place]) 11/22/2018   Ischemic heart disease 09/25/2018   Not currently working due to disabled status 06/13/2018   ESRD on dialysis ([place]) 06/12/2018   HTN (hypertension) 06/12/2018   Depression 03/09/2018   Adrenal mass, left ([place]) 11/09/2017   MGUS (monoclonal gammopathy of unknown significance) 11/05/2017   Shoulder pain 11/01/2017   Chronic diastolic (congestive) heart failure ([place]) 09/20/2017   Bilateral leg weakness 09/09/2017   Adrenal adenoma 09/03/2017   Uncontrolled type 2 diabetes mellitus 08/18/2017   Unsteady gait 07/11/2017   Recurrent falls 07/11/2017   Anemia of chronic disease 03/24/2017   Personal history of colonic polyps 02/10/2017   Dysphagia 11/05/2016    Irritable bowel syndrome 02/04/2016   Hospital discharge follow-up 10/27/2015   Intolerant of heat 10/07/2014   Cardiac murmur 01/17/2014   Back pain with left-sided radiculopathy 09/18/2013   Seasonal allergies 03/10/2013   Lipoma of back 12/22/2012   Other seasonal allergic rhinitis 08/22/2012   OSA (obstructive sleep apnea) 06/02/2011   Chronic kidney disease, stage 4 (severe) ([place]) 01/13/2011   Neuropathy 04/16/2010   Malaise and fatigue 07/24/2009   Personal history of other diseases of the nervous system and sense organs 05/08/2009   LUPUS ERYTHEMATOSUS, DISCOID 06/05/2008   Arm pain 05/30/2008   Hyperlipidemia 06/07/2007   Obesity  06/07/2007   Malignant hypertension 06/07/2007   Type 2 diabetes mellitus with hyperglycemia ([place]) 06/07/2007   Diabetes mellitus ([place]) 06/07/2007   Past Medical History:  Diagnosis Date   Acid reflux    Anemia    Arthritis    Axillary masses    Soft tissue - status post excision   Back pain    COVID-19 virus infection 04/06/2019   Depression    End-stage renal disease ([place])    TTHSat  in [place] hypertension    History of blood transfusion    History of cardiac catheterization    Normal coronary arteries October 2020   History of claustrophobia    History of pneumonia 2019   Hypoxia 04/03/2019   Mixed hyperlipidemia    Obesity    Pancreatitis    Peritoneal dialysis catheter in place [facility])    Pneumonia due to COVID-19 virus 04/02/2019   Sleep apnea    Noncompliant with CPAP   Type 2 diabetes mellitus ([place])     Family History  Problem Relation Age of Onset   Hypertension Father    Hypercholesterolemia Father    Arthritis Father    Hypertension Sister    Hypercholesterolemia Sister    Breast cancer Sister    Hypertension Sister    Colon cancer Neg Hx    Colon polyps Neg Hx     Past Surgical History:  Procedure Laterality Date   ABDOMINAL AORTOGRAM W/LOWER EXTREMITY N/A 04/30/2022   Procedure: ABDOMINAL  AORTOGRAM W/LOWER EXTREMITY;  Surgeon: Cherre Robins, MD;  Location: [facility];  Service: Cardiovascular;  Laterality: N/A;   ABDOMINAL HYSTERECTOMY     AV FISTULA PLACEMENT Left 09/02/2017   Procedure: creation of left arm ARTERIOVENOUS (AV) FISTULA;  Surgeon: Serafina Mitchell, MD;  Location: [facility] OR;  Service: Vascular;  Laterality: Left;   COLONOSCOPY  2008   Dr. Oneida Alar: normal    COLONOSCOPY N/A 12/18/2016   Dr. Oneida Alar: multiple tubular adenomas, internal hemorrhoids. Surveillance in 3 years    ESOPHAGEAL DILATION N/A 10/13/2015   Procedure: ESOPHAGEAL DILATION;  Surgeon: Rogene Houston, MD;  [facility];  Service: Endoscopy;  Laterality: N/A;   ESOPHAGOGASTRODUODENOSCOPY N/A 10/13/2015   Dr. Laural Golden: chronic gastritis on path, no H.pylori. Empiric dilation    ESOPHAGOGASTRODUODENOSCOPY N/A 12/18/2016   Dr. Oneida Alar: mild gastritis. BRAVO study revealed uncontrolled GERD. Dysphagia secondary to uncontrolled reflux   FOOT SURGERY Bilateral    "nerve"     LEFT HEART CATH AND CORONARY ANGIOGRAPHY N/A 12/29/2018   Procedure: LEFT HEART CATH AND CORONARY ANGIOGRAPHY;  Surgeon: Jettie Booze, MD;  Location: [facility];  Service: Cardiovascular;  Laterality: N/A;   LOWER EXTREMITY ANGIOGRAPHY Right 05/04/2022   Procedure: Lower Extremity Angiography;  Surgeon: Broadus John, MD;  Location: [facility];  Service: Cardiovascular;  Laterality: Right;   LUNG BIOPSY     MASS EXCISION Right 01/09/2013   Procedure: EXCISION OF NEOPLASM OF RIGHT  AXILLA  AND EXCISION OF NEOPLASM OF LEFT AXILLA;  Surgeon: Jamesetta So, MD;  Location: [facility];  Service: General;  Laterality: Right;  procedure end @ 08:23   MYRINGOTOMY WITH TUBE PLACEMENT Bilateral 04/28/2017   Procedure: BILATERAL MYRINGOTOMY WITH TUBE PLACEMENT;  Surgeon: Leta Baptist, MD;  Location: [facility];  Service: ENT;  Laterality: Bilateral;   PERIPHERAL VASCULAR BALLOON ANGIOPLASTY Right 05/04/2022   Procedure:  PERIPHERAL VASCULAR BALLOON ANGIOPLASTY;  Surgeon: Broadus John, MD;  Location:  [facility];  Service: Cardiovascular;  Laterality: Right;  PT   PERIPHERAL VASCULAR INTERVENTION Right 05/04/2022   Procedure: PERIPHERAL VASCULAR INTERVENTION;  Surgeon: Broadus John, MD;  Location: [facility];  Service: Cardiovascular;  Laterality: Right;  SFA   REVISION OF ARTERIOVENOUS GORETEX GRAFT Left 05/04/2018   Procedure: TRANSPOSITION OF CEPHALIC VEIN ARTERIOVENOUS FISTULA LEFT ARM;  Surgeon: Rosetta Posner, MD;  Location: [facility];  Service: Vascular;  Laterality: Left;   SAVORY DILATION N/A 12/18/2016   Procedure: SAVORY DILATION;  Surgeon: Danie Binder, MD;  [facility];  Service: Endoscopy;  Laterality: N/A;   Social History   Occupational History   Occupation: retired   Tobacco Use   Smoking status: Never    Passive exposure: Never   Smokeless tobacco: Never   Tobacco comments:    Verified by Daughter, [name]  Vaping Use   Vaping Use: Never used  Substance and Sexual Activity   Alcohol use: No   Drug use: No   Sexual activity: Not Currently    Partners: Male

## 2022-05-28 NOTE — Progress Notes (Signed)
Spoke with pt's daughter, Kathryn Beck for pre-op call. She states pt does not have a cardiac history but is treated for HTN and Diabetes. Last A1C was 9.3 on 04/16/22. She states her fasting blood sugar is usually between 150-160. Instructed Kathryn Beck to have pt check her blood sugar when she wakes up in the AM and every 2 hours until she leaves for the hospital. If blood sugar is >220 take 1/2 of usual correction dose of Novolog insulin. If blood sugar is 70 or below, treat with 1/2 cup of clear juice (apple or cranberry) and recheck blood sugar 15 minutes after drinking juice. If blood sugar continues to be 70 or below, call the Short Stay department and ask to speak to a nurse.  Shower instructions given to [facility] and she voiced understanding.

## 2022-05-29 ENCOUNTER — Inpatient Hospital Stay (HOSPITAL_COMMUNITY): Payer: Medicare PPO | Admitting: Certified Registered"

## 2022-05-29 ENCOUNTER — Encounter (HOSPITAL_COMMUNITY): Payer: Self-pay | Admitting: Orthopedic Surgery

## 2022-05-29 ENCOUNTER — Other Ambulatory Visit: Payer: Self-pay

## 2022-05-29 ENCOUNTER — Other Ambulatory Visit (HOSPITAL_BASED_OUTPATIENT_CLINIC_OR_DEPARTMENT_OTHER): Payer: Self-pay

## 2022-05-29 ENCOUNTER — Inpatient Hospital Stay (HOSPITAL_COMMUNITY)
Admission: RE | Admit: 2022-05-29 | Discharge: 2022-06-10 | DRG: 239 | Disposition: A | Discharge status: Skilled Nursing Facility | Payer: Medicare PPO | Attending: Orthopedic Surgery | Admitting: Orthopedic Surgery

## 2022-05-29 ENCOUNTER — Encounter (HOSPITAL_COMMUNITY)
Admission: RE | Disposition: A | Discharge status: Skilled Nursing Facility | Payer: Self-pay | Source: Home / Self Care | Attending: Orthopedic Surgery

## 2022-05-29 DIAGNOSIS — E11621 Type 2 diabetes mellitus with foot ulcer: Secondary | ICD-10-CM | POA: Diagnosis present

## 2022-05-29 DIAGNOSIS — Z7982 Long term (current) use of aspirin: Secondary | ICD-10-CM

## 2022-05-29 DIAGNOSIS — Z8249 Family history of ischemic heart disease and other diseases of the circulatory system: Secondary | ICD-10-CM

## 2022-05-29 DIAGNOSIS — N186 End stage renal disease: Secondary | ICD-10-CM

## 2022-05-29 DIAGNOSIS — Z803 Family history of malignant neoplasm of breast: Secondary | ICD-10-CM | POA: Diagnosis not present

## 2022-05-29 DIAGNOSIS — E1152 Type 2 diabetes mellitus with diabetic peripheral angiopathy with gangrene: Secondary | ICD-10-CM | POA: Diagnosis not present

## 2022-05-29 DIAGNOSIS — Z8616 Personal history of COVID-19: Secondary | ICD-10-CM | POA: Diagnosis not present

## 2022-05-29 DIAGNOSIS — Z79899 Other long term (current) drug therapy: Secondary | ICD-10-CM

## 2022-05-29 DIAGNOSIS — Z83438 Family history of other disorder of lipoprotein metabolism and other lipidemia: Secondary | ICD-10-CM | POA: Diagnosis not present

## 2022-05-29 DIAGNOSIS — R2689 Other abnormalities of gait and mobility: Secondary | ICD-10-CM | POA: Diagnosis not present

## 2022-05-29 DIAGNOSIS — E669 Obesity, unspecified: Secondary | ICD-10-CM | POA: Diagnosis present

## 2022-05-29 DIAGNOSIS — N250 Renal osteodystrophy: Secondary | ICD-10-CM | POA: Diagnosis not present

## 2022-05-29 DIAGNOSIS — I10 Essential (primary) hypertension: Secondary | ICD-10-CM | POA: Diagnosis not present

## 2022-05-29 DIAGNOSIS — I120 Hypertensive chronic kidney disease with stage 5 chronic kidney disease or end stage renal disease: Secondary | ICD-10-CM | POA: Diagnosis present

## 2022-05-29 DIAGNOSIS — Z91199 Patient's noncompliance with other medical treatment and regimen due to unspecified reason: Secondary | ICD-10-CM | POA: Diagnosis not present

## 2022-05-29 DIAGNOSIS — Z888 Allergy status to other drugs, medicaments and biological substances status: Secondary | ICD-10-CM | POA: Diagnosis not present

## 2022-05-29 DIAGNOSIS — Z90710 Acquired absence of both cervix and uterus: Secondary | ICD-10-CM | POA: Diagnosis not present

## 2022-05-29 DIAGNOSIS — E1165 Type 2 diabetes mellitus with hyperglycemia: Secondary | ICD-10-CM | POA: Diagnosis not present

## 2022-05-29 DIAGNOSIS — E782 Mixed hyperlipidemia: Secondary | ICD-10-CM | POA: Diagnosis not present

## 2022-05-29 DIAGNOSIS — I509 Heart failure, unspecified: Secondary | ICD-10-CM | POA: Diagnosis not present

## 2022-05-29 DIAGNOSIS — I96 Gangrene, not elsewhere classified: Secondary | ICD-10-CM

## 2022-05-29 DIAGNOSIS — Z8673 Personal history of transient ischemic attack (TIA), and cerebral infarction without residual deficits: Secondary | ICD-10-CM

## 2022-05-29 DIAGNOSIS — G4730 Sleep apnea, unspecified: Secondary | ICD-10-CM | POA: Diagnosis present

## 2022-05-29 DIAGNOSIS — Z992 Dependence on renal dialysis: Secondary | ICD-10-CM

## 2022-05-29 DIAGNOSIS — Z794 Long term (current) use of insulin: Secondary | ICD-10-CM

## 2022-05-29 DIAGNOSIS — K219 Gastro-esophageal reflux disease without esophagitis: Secondary | ICD-10-CM | POA: Diagnosis present

## 2022-05-29 DIAGNOSIS — R296 Repeated falls: Secondary | ICD-10-CM | POA: Diagnosis not present

## 2022-05-29 DIAGNOSIS — Z7984 Long term (current) use of oral hypoglycemic drugs: Secondary | ICD-10-CM

## 2022-05-29 DIAGNOSIS — Z6830 Body mass index (BMI) 30.0-30.9, adult: Secondary | ICD-10-CM | POA: Diagnosis not present

## 2022-05-29 DIAGNOSIS — F32A Depression, unspecified: Secondary | ICD-10-CM | POA: Diagnosis not present

## 2022-05-29 DIAGNOSIS — G894 Chronic pain syndrome: Secondary | ICD-10-CM | POA: Diagnosis not present

## 2022-05-29 DIAGNOSIS — Z89511 Acquired absence of right leg below knee: Secondary | ICD-10-CM | POA: Diagnosis not present

## 2022-05-29 DIAGNOSIS — Z880 Allergy status to penicillin: Secondary | ICD-10-CM

## 2022-05-29 DIAGNOSIS — L97519 Non-pressure chronic ulcer of other part of right foot with unspecified severity: Secondary | ICD-10-CM | POA: Diagnosis present

## 2022-05-29 DIAGNOSIS — M13169 Monoarthritis, not elsewhere classified, unspecified knee: Secondary | ICD-10-CM | POA: Diagnosis not present

## 2022-05-29 DIAGNOSIS — E1122 Type 2 diabetes mellitus with diabetic chronic kidney disease: Secondary | ICD-10-CM | POA: Diagnosis present

## 2022-05-29 DIAGNOSIS — M6281 Muscle weakness (generalized): Secondary | ICD-10-CM | POA: Diagnosis not present

## 2022-05-29 DIAGNOSIS — G8918 Other acute postprocedural pain: Secondary | ICD-10-CM | POA: Diagnosis not present

## 2022-05-29 DIAGNOSIS — I132 Hypertensive heart and chronic kidney disease with heart failure and with stage 5 chronic kidney disease, or end stage renal disease: Secondary | ICD-10-CM

## 2022-05-29 DIAGNOSIS — Z743 Need for continuous supervision: Secondary | ICD-10-CM | POA: Diagnosis not present

## 2022-05-29 DIAGNOSIS — R011 Cardiac murmur, unspecified: Secondary | ICD-10-CM

## 2022-05-29 DIAGNOSIS — E1140 Type 2 diabetes mellitus with diabetic neuropathy, unspecified: Secondary | ICD-10-CM | POA: Diagnosis not present

## 2022-05-29 DIAGNOSIS — I739 Peripheral vascular disease, unspecified: Secondary | ICD-10-CM | POA: Diagnosis not present

## 2022-05-29 DIAGNOSIS — Z7902 Long term (current) use of antithrombotics/antiplatelets: Secondary | ICD-10-CM

## 2022-05-29 DIAGNOSIS — R531 Weakness: Secondary | ICD-10-CM | POA: Diagnosis not present

## 2022-05-29 DIAGNOSIS — I5032 Chronic diastolic (congestive) heart failure: Secondary | ICD-10-CM | POA: Diagnosis not present

## 2022-05-29 DIAGNOSIS — F40240 Claustrophobia: Secondary | ICD-10-CM | POA: Diagnosis present

## 2022-05-29 DIAGNOSIS — D631 Anemia in chronic kidney disease: Secondary | ICD-10-CM | POA: Diagnosis present

## 2022-05-29 DIAGNOSIS — M545 Low back pain, unspecified: Secondary | ICD-10-CM

## 2022-05-29 DIAGNOSIS — R41841 Cognitive communication deficit: Secondary | ICD-10-CM | POA: Diagnosis not present

## 2022-05-29 DIAGNOSIS — L97909 Non-pressure chronic ulcer of unspecified part of unspecified lower leg with unspecified severity: Secondary | ICD-10-CM | POA: Diagnosis not present

## 2022-05-29 DIAGNOSIS — Z4781 Encounter for orthopedic aftercare following surgical amputation: Secondary | ICD-10-CM | POA: Diagnosis not present

## 2022-05-29 HISTORY — DX: Cerebral infarction, unspecified: I63.9

## 2022-05-29 HISTORY — PX: AMPUTATION: SHX166

## 2022-05-29 HISTORY — DX: Headache, unspecified: R51.9

## 2022-05-29 LAB — GLUCOSE, CAPILLARY
Glucose-Capillary: 213 mg/dL — ABNORMAL HIGH (ref 70–99)
Glucose-Capillary: 133 mg/dL — ABNORMAL HIGH (ref 70–99)
Glucose-Capillary: 146 mg/dL — ABNORMAL HIGH (ref 70–99)
Glucose-Capillary: 294 mg/dL — ABNORMAL HIGH (ref 70–99)
Glucose-Capillary: 226 mg/dL — ABNORMAL HIGH (ref 70–99)

## 2022-05-29 LAB — POCT I-STAT, CHEM 8
BUN: 41 mg/dL — ABNORMAL HIGH (ref 8–23)
Calcium, Ion: 1.29 mmol/L (ref 1.15–1.40)
Chloride: 106 mmol/L (ref 98–111)
Creatinine, Ser: 8 mg/dL — ABNORMAL HIGH (ref 0.44–1.00)
Glucose, Bld: 211 mg/dL — ABNORMAL HIGH (ref 70–99)
HCT: 34 % — ABNORMAL LOW (ref 36.0–46.0)
Hemoglobin: 11.6 g/dL — ABNORMAL LOW (ref 12.0–15.0)
Potassium: 4 mmol/L (ref 3.5–5.1)
Sodium: 145 mmol/L (ref 135–145)
TCO2: 29 mmol/L (ref 22–32)

## 2022-05-29 SURGERY — AMPUTATION BELOW KNEE
Anesthesia: Regional | Site: Knee | Laterality: Right

## 2022-05-29 MED ORDER — ONDANSETRON HCL 4 MG/2ML IJ SOLN: 4.0000 mg | Freq: Once | INTRAMUSCULAR | Status: DC | PRN

## 2022-05-29 MED ORDER — METOPROLOL TARTRATE 5 MG/5ML IV SOLN: 2.0000 mg | INTRAVENOUS | Status: DC | PRN

## 2022-05-29 MED ORDER — MIDAZOLAM HCL 2 MG/2ML IJ SOLN
INTRAMUSCULAR | Status: AC
  Administered 2022-05-29: 1 mg
  Filled 2022-05-29: qty 2

## 2022-05-29 MED ORDER — VITAMIN C 500 MG PO TABS
1000.0000 mg | ORAL_TABLET | Freq: Every day | ORAL | Status: DC
  Administered 2022-05-29 – 2022-06-10 (×13): 1000 mg via ORAL
  Filled 2022-05-29 (×12): qty 2

## 2022-05-29 MED ORDER — SUCROFERRIC OXYHYDROXIDE 500 MG PO CHEW: 500.0000 mg | CHEWABLE_TABLET | ORAL | Status: DC

## 2022-05-29 MED ORDER — FENTANYL CITRATE (PF) 250 MCG/5ML IJ SOLN
INTRAMUSCULAR | Status: DC | PRN
  Administered 2022-05-29: 50 ug via INTRAVENOUS

## 2022-05-29 MED ORDER — ONDANSETRON HCL 4 MG/2ML IJ SOLN: 4.0000 mg | Freq: Four times a day (QID) | INTRAMUSCULAR | Status: DC | PRN

## 2022-05-29 MED ORDER — LIDOCAINE 2% (20 MG/ML) 5 ML SYRINGE
INTRAMUSCULAR | Status: AC
  Filled 2022-05-29: qty 10

## 2022-05-29 MED ORDER — CEFAZOLIN SODIUM-DEXTROSE 1-4 GM/50ML-% IV SOLN
1.0000 g | INTRAVENOUS | Status: DC
  Administered 2022-05-30: 1 g via INTRAVENOUS
  Filled 2022-05-29 (×2): qty 50

## 2022-05-29 MED ORDER — EPHEDRINE SULFATE-NACL 50-0.9 MG/10ML-% IV SOSY
PREFILLED_SYRINGE | INTRAVENOUS | Status: DC | PRN
  Administered 2022-05-29 (×3): 5 mg via INTRAVENOUS

## 2022-05-29 MED ORDER — CLOPIDOGREL BISULFATE 75 MG PO TABS
75.0000 mg | ORAL_TABLET | Freq: Every day | ORAL | Status: DC
  Administered 2022-05-30 – 2022-06-10 (×11): 75 mg via ORAL
  Filled 2022-05-29 (×11): qty 1

## 2022-05-29 MED ORDER — CALCITRIOL 0.25 MCG PO CAPS
0.2500 ug | ORAL_CAPSULE | Freq: Two times a day (BID) | ORAL | Status: DC
  Administered 2022-05-29 – 2022-06-04 (×12): 0.25 ug via ORAL
  Filled 2022-05-29 (×14): qty 1

## 2022-05-29 MED ORDER — INSULIN GLARGINE (2 UNIT DIAL) 300 UNIT/ML ~~LOC~~ SOPN: 80.0000 [IU] | PEN_INJECTOR | Freq: Every day | SUBCUTANEOUS | Status: DC

## 2022-05-29 MED ORDER — ONDANSETRON HCL 4 MG/2ML IJ SOLN
INTRAMUSCULAR | Status: AC
  Filled 2022-05-29: qty 2

## 2022-05-29 MED ORDER — ONDANSETRON HCL 4 MG/2ML IJ SOLN
INTRAMUSCULAR | Status: DC | PRN
  Administered 2022-05-29: 4 mg via INTRAVENOUS

## 2022-05-29 MED ORDER — LIDOCAINE 2% (20 MG/ML) 5 ML SYRINGE
INTRAMUSCULAR | Status: DC | PRN
  Administered 2022-05-29: 40 mg via INTRAVENOUS

## 2022-05-29 MED ORDER — METOPROLOL SUCCINATE ER 50 MG PO TB24
50.0000 mg | ORAL_TABLET | Freq: Every evening | ORAL | Status: DC
  Administered 2022-05-29 – 2022-06-10 (×13): 50 mg via ORAL
  Filled 2022-05-29 (×13): qty 1

## 2022-05-29 MED ORDER — INSULIN ASPART 100 UNIT/ML IJ SOLN
0.0000 [IU] | INTRAMUSCULAR | Status: AC | PRN
  Administered 2022-05-29 (×2): 2 [IU] via SUBCUTANEOUS
  Filled 2022-05-29: qty 1

## 2022-05-29 MED ORDER — CHLORHEXIDINE GLUCONATE 0.12 % MT SOLN
15.0000 mL | Freq: Once | OROMUCOSAL | Status: AC
  Administered 2022-05-29: 15 mL via OROMUCOSAL
  Filled 2022-05-29: qty 15

## 2022-05-29 MED ORDER — ORAL CARE MOUTH RINSE: 15.0000 mL | Freq: Once | OROMUCOSAL | Status: AC

## 2022-05-29 MED ORDER — ACETAMINOPHEN 325 MG PO TABS
325.0000 mg | ORAL_TABLET | Freq: Four times a day (QID) | ORAL | Status: DC | PRN
  Administered 2022-06-05: 650 mg via ORAL
  Filled 2022-05-29: qty 2

## 2022-05-29 MED ORDER — CEFAZOLIN SODIUM-DEXTROSE 2-4 GM/100ML-% IV SOLN
2.0000 g | INTRAVENOUS | Status: AC
  Administered 2022-05-29: 2 g via INTRAVENOUS
  Filled 2022-05-29: qty 100

## 2022-05-29 MED ORDER — BISACODYL 5 MG PO TBEC: 5.0000 mg | DELAYED_RELEASE_TABLET | Freq: Every day | ORAL | Status: DC | PRN

## 2022-05-29 MED ORDER — KETOTIFEN FUMARATE 0.035 % OP SOLN
1.0000 [drp] | Freq: Two times a day (BID) | OPHTHALMIC | Status: DC
  Administered 2022-05-29 – 2022-06-10 (×23): 1 [drp] via OPHTHALMIC
  Filled 2022-05-29 (×3): qty 5

## 2022-05-29 MED ORDER — DULOXETINE HCL 60 MG PO CPEP
60.0000 mg | ORAL_CAPSULE | Freq: Every day | ORAL | Status: DC
  Administered 2022-05-30 – 2022-06-10 (×12): 60 mg via ORAL
  Filled 2022-05-29 (×13): qty 1

## 2022-05-29 MED ORDER — DEXAMETHASONE SODIUM PHOSPHATE 10 MG/ML IJ SOLN
INTRAMUSCULAR | Status: AC
  Filled 2022-05-29: qty 1

## 2022-05-29 MED ORDER — SUCROFERRIC OXYHYDROXIDE 500 MG PO CHEW
1000.0000 mg | CHEWABLE_TABLET | Freq: Three times a day (TID) | ORAL | Status: DC
  Administered 2022-05-29 – 2022-06-10 (×27): 1000 mg via ORAL
  Filled 2022-05-29 (×30): qty 2

## 2022-05-29 MED ORDER — JUVEN PO PACK
1.0000 | PACK | Freq: Two times a day (BID) | ORAL | Status: DC
  Administered 2022-05-29 – 2022-06-10 (×18): 1 via ORAL
  Filled 2022-05-29 (×18): qty 1

## 2022-05-29 MED ORDER — OXYCODONE HCL 5 MG/5ML PO SOLN: 5.0000 mg | Freq: Once | ORAL | Status: DC | PRN

## 2022-05-29 MED ORDER — ACETAMINOPHEN 500 MG PO TABS: 1000.0000 mg | ORAL_TABLET | Freq: Once | ORAL | Status: DC

## 2022-05-29 MED ORDER — ALUM & MAG HYDROXIDE-SIMETH 200-200-20 MG/5ML PO SUSP: 15.0000 mL | ORAL | Status: DC | PRN

## 2022-05-29 MED ORDER — BUPIVACAINE LIPOSOME 1.3 % IJ SUSP
INTRAMUSCULAR | Status: DC | PRN
  Administered 2022-05-29: 10 mL via PERINEURAL

## 2022-05-29 MED ORDER — OXYCODONE HCL 5 MG PO TABS
10.0000 mg | ORAL_TABLET | ORAL | Status: DC | PRN
  Administered 2022-05-30 (×2): 15 mg via ORAL
  Filled 2022-05-29 (×2): qty 3

## 2022-05-29 MED ORDER — FENTANYL CITRATE (PF) 250 MCG/5ML IJ SOLN
INTRAMUSCULAR | Status: AC
  Filled 2022-05-29: qty 5

## 2022-05-29 MED ORDER — ACETAMINOPHEN 500 MG PO TABS
1000.0000 mg | ORAL_TABLET | Freq: Once | ORAL | Status: AC
  Administered 2022-05-29: 1000 mg via ORAL
  Filled 2022-05-29: qty 2

## 2022-05-29 MED ORDER — MAGNESIUM CITRATE PO SOLN: 1.0000 | Freq: Once | ORAL | Status: DC | PRN

## 2022-05-29 MED ORDER — MAGNESIUM SULFATE 2 GM/50ML IV SOLN: 2.0000 g | Freq: Every day | INTRAVENOUS | Status: DC | PRN

## 2022-05-29 MED ORDER — ROPIVACAINE HCL 5 MG/ML IJ SOLN
INTRAMUSCULAR | Status: DC | PRN
  Administered 2022-05-29: 30 mL via PERINEURAL

## 2022-05-29 MED ORDER — GUAIFENESIN-DM 100-10 MG/5ML PO SYRP: 15.0000 mL | ORAL_SOLUTION | ORAL | Status: DC | PRN

## 2022-05-29 MED ORDER — HYDROMORPHONE HCL 1 MG/ML IJ SOLN: 0.2500 mg | INTRAMUSCULAR | Status: DC | PRN

## 2022-05-29 MED ORDER — ZINC SULFATE 220 (50 ZN) MG PO CAPS
220.0000 mg | ORAL_CAPSULE | Freq: Every day | ORAL | Status: DC
  Administered 2022-05-29 – 2022-06-10 (×13): 220 mg via ORAL
  Filled 2022-05-29 (×13): qty 1

## 2022-05-29 MED ORDER — INSULIN ASPART 100 UNIT/ML IJ SOLN
0.0000 [IU] | Freq: Three times a day (TID) | INTRAMUSCULAR | Status: DC
  Administered 2022-05-29: 5 [IU] via SUBCUTANEOUS
  Administered 2022-05-30 (×2): 1 [IU] via SUBCUTANEOUS
  Administered 2022-06-02 (×2): 2 [IU] via SUBCUTANEOUS
  Administered 2022-06-03: 5 [IU] via SUBCUTANEOUS
  Administered 2022-06-03: 3 [IU] via SUBCUTANEOUS
  Administered 2022-06-04 (×3): 2 [IU] via SUBCUTANEOUS
  Administered 2022-06-05: 1 [IU] via SUBCUTANEOUS
  Administered 2022-06-05: 2 [IU] via SUBCUTANEOUS
  Administered 2022-06-07 (×3): 3 [IU] via SUBCUTANEOUS
  Administered 2022-06-08: 2 [IU] via SUBCUTANEOUS
  Administered 2022-06-08 – 2022-06-09 (×2): 1 [IU] via SUBCUTANEOUS
  Administered 2022-06-09: 3 [IU] via SUBCUTANEOUS
  Administered 2022-06-09: 2 [IU] via SUBCUTANEOUS

## 2022-05-29 MED ORDER — SERTRALINE HCL 50 MG PO TABS
50.0000 mg | ORAL_TABLET | Freq: Every day | ORAL | Status: DC
  Administered 2022-05-30 – 2022-06-10 (×12): 50 mg via ORAL
  Filled 2022-05-29 (×12): qty 1

## 2022-05-29 MED ORDER — POLYETHYLENE GLYCOL 3350 17 G PO PACK: 17.0000 g | PACK | Freq: Every day | ORAL | Status: DC | PRN

## 2022-05-29 MED ORDER — FENTANYL CITRATE (PF) 100 MCG/2ML IJ SOLN
INTRAMUSCULAR | Status: AC
  Administered 2022-05-29: 50 ug
  Filled 2022-05-29: qty 2

## 2022-05-29 MED ORDER — TRANEXAMIC ACID-NACL 1000-0.7 MG/100ML-% IV SOLN
1000.0000 mg | INTRAVENOUS | Status: AC
  Administered 2022-05-29: 1000 mg via INTRAVENOUS
  Filled 2022-05-29: qty 100

## 2022-05-29 MED ORDER — MIDAZOLAM HCL 2 MG/2ML IJ SOLN
INTRAMUSCULAR | Status: AC
  Filled 2022-05-29: qty 2

## 2022-05-29 MED ORDER — PHENOL 1.4 % MT LIQD: 1.0000 | OROMUCOSAL | Status: DC | PRN

## 2022-05-29 MED ORDER — SODIUM CHLORIDE 0.9 % IV SOLN: INTRAVENOUS | Status: DC

## 2022-05-29 MED ORDER — SUCROFERRIC OXYHYDROXIDE 500 MG PO CHEW
500.0000 mg | CHEWABLE_TABLET | ORAL | Status: DC
  Administered 2022-05-29 – 2022-06-09 (×13): 500 mg via ORAL
  Filled 2022-05-29 (×9): qty 1

## 2022-05-29 MED ORDER — DEXAMETHASONE SODIUM PHOSPHATE 10 MG/ML IJ SOLN
INTRAMUSCULAR | Status: DC | PRN
  Administered 2022-05-29 (×2): 5 mg via INTRAVENOUS

## 2022-05-29 MED ORDER — FUROSEMIDE 40 MG PO TABS
40.0000 mg | ORAL_TABLET | Freq: Two times a day (BID) | ORAL | Status: DC
  Administered 2022-05-29 – 2022-06-10 (×22): 40 mg via ORAL
  Filled 2022-05-29 (×23): qty 1

## 2022-05-29 MED ORDER — EZETIMIBE 10 MG PO TABS
10.0000 mg | ORAL_TABLET | Freq: Every day | ORAL | Status: DC
  Administered 2022-05-30 – 2022-06-10 (×12): 10 mg via ORAL
  Filled 2022-05-29 (×13): qty 1

## 2022-05-29 MED ORDER — LACTATED RINGERS IV SOLN: INTRAVENOUS | Status: DC

## 2022-05-29 MED ORDER — PREGABALIN 25 MG PO CAPS
50.0000 mg | ORAL_CAPSULE | Freq: Three times a day (TID) | ORAL | Status: DC
  Administered 2022-05-29 – 2022-06-06 (×22): 50 mg via ORAL
  Filled 2022-05-29 (×22): qty 2

## 2022-05-29 MED ORDER — OXYCODONE HCL 5 MG PO TABS: 5.0000 mg | ORAL_TABLET | Freq: Once | ORAL | Status: DC | PRN

## 2022-05-29 MED ORDER — DOCUSATE SODIUM 100 MG PO CAPS
100.0000 mg | ORAL_CAPSULE | Freq: Every day | ORAL | Status: DC
  Administered 2022-05-30 – 2022-06-10 (×9): 100 mg via ORAL
  Filled 2022-05-29 (×10): qty 1

## 2022-05-29 MED ORDER — ASPIRIN 81 MG PO CHEW
162.0000 mg | CHEWABLE_TABLET | Freq: Every day | ORAL | Status: DC
  Administered 2022-05-30 – 2022-06-10 (×12): 162 mg via ORAL
  Filled 2022-05-29 (×13): qty 2

## 2022-05-29 MED ORDER — INSULIN ASPART 100 UNIT/ML IJ SOLN
2.0000 [IU] | Freq: Three times a day (TID) | INTRAMUSCULAR | Status: DC
  Administered 2022-05-30 – 2022-05-31 (×3): 2 [IU] via SUBCUTANEOUS

## 2022-05-29 MED ORDER — POTASSIUM CHLORIDE CRYS ER 20 MEQ PO TBCR: 20.0000 meq | EXTENDED_RELEASE_TABLET | Freq: Every day | ORAL | Status: DC | PRN

## 2022-05-29 MED ORDER — PROPOFOL 10 MG/ML IV BOLUS
INTRAVENOUS | Status: AC
  Filled 2022-05-29: qty 20

## 2022-05-29 MED ORDER — OXYCODONE HCL 5 MG PO TABS: 5.0000 mg | ORAL_TABLET | ORAL | Status: DC | PRN

## 2022-05-29 MED ORDER — PHENYLEPHRINE 80 MCG/ML (10ML) SYRINGE FOR IV PUSH (FOR BLOOD PRESSURE SUPPORT)
PREFILLED_SYRINGE | INTRAVENOUS | Status: DC | PRN
  Administered 2022-05-29: 160 ug via INTRAVENOUS
  Administered 2022-05-29: 80 ug via INTRAVENOUS

## 2022-05-29 MED ORDER — HYDROMORPHONE HCL 1 MG/ML IJ SOLN: 0.5000 mg | INTRAMUSCULAR | Status: DC | PRN

## 2022-05-29 MED ORDER — PROPOFOL 10 MG/ML IV BOLUS
INTRAVENOUS | Status: DC | PRN
  Administered 2022-05-29: 150 mg via INTRAVENOUS

## 2022-05-29 MED ORDER — INSULIN GLARGINE-YFGN 100 UNIT/ML ~~LOC~~ SOLN
80.0000 [IU] | Freq: Every day | SUBCUTANEOUS | Status: DC
  Administered 2022-05-30 – 2022-05-31 (×2): 80 [IU] via SUBCUTANEOUS
  Filled 2022-05-29 (×3): qty 0.8

## 2022-05-29 MED ORDER — LABETALOL HCL 5 MG/ML IV SOLN: 10.0000 mg | INTRAVENOUS | Status: DC | PRN

## 2022-05-29 MED ORDER — HYDRALAZINE HCL 20 MG/ML IJ SOLN: 5.0000 mg | INTRAMUSCULAR | Status: DC | PRN

## 2022-05-29 MED ORDER — 0.9 % SODIUM CHLORIDE (POUR BTL) OPTIME
TOPICAL | Status: DC | PRN
  Administered 2022-05-29: 1000 mL

## 2022-05-29 MED ORDER — PANTOPRAZOLE SODIUM 40 MG PO TBEC
40.0000 mg | DELAYED_RELEASE_TABLET | Freq: Every day | ORAL | Status: DC
  Administered 2022-05-29 – 2022-06-10 (×13): 40 mg via ORAL
  Filled 2022-05-29 (×13): qty 1

## 2022-05-29 SURGICAL SUPPLY — 43 items
BAG COUNTER SPONGE SURGICOUNT (BAG) IMPLANT
BAG SPNG CNTER NS LX DISP (BAG)
BIT DRILL 3.2XOCPTL (BIT) ×1 IMPLANT
BIT DRL 3.2XOCPTL (BIT) ×1
BLADE SAW RECIP 87.9 MT (BLADE) ×1 IMPLANT
BLADE SURG 21 STRL SS (BLADE) ×1 IMPLANT
BNDG CMPR 5X6 CHSV STRCH STRL (GAUZE/BANDAGES/DRESSINGS)
BNDG COHESIVE 6X5 TAN ST LF (GAUZE/BANDAGES/DRESSINGS) IMPLANT
CANISTER WOUND CARE 500ML ATS (WOUND CARE) ×1 IMPLANT
COVER SURGICAL LIGHT HANDLE (MISCELLANEOUS) ×1 IMPLANT
CUFF TOURN SGL QUICK 34 (TOURNIQUET CUFF) ×1
CUFF TRNQT CYL 34X4.125X (TOURNIQUET CUFF) ×1 IMPLANT
DRAPE DERMATAC (DRAPES) IMPLANT
DRAPE INCISE IOBAN 66X45 STRL (DRAPES) ×1 IMPLANT
DRAPE U-SHAPE 47X51 STRL (DRAPES) ×1 IMPLANT
DRESSING PREVENA PLUS CUSTOM (GAUZE/BANDAGES/DRESSINGS) ×1 IMPLANT
DRILL BIT (BIT) ×1
DRSG PREVENA PLUS CUSTOM (GAUZE/BANDAGES/DRESSINGS) ×1
DURAPREP 26ML APPLICATOR (WOUND CARE) ×1 IMPLANT
ELECT REM PT RETURN 9FT ADLT (ELECTROSURGICAL) ×1
ELECTRODE REM PT RTRN 9FT ADLT (ELECTROSURGICAL) ×1 IMPLANT
GLOVE BIOGEL PI IND STRL 9 (GLOVE) ×1 IMPLANT
GLOVE SURG ORTHO 9.0 STRL STRW (GLOVE) ×1 IMPLANT
GOWN STRL REUS W/ TWL XL LVL3 (GOWN DISPOSABLE) ×2 IMPLANT
GOWN STRL REUS W/TWL XL LVL3 (GOWN DISPOSABLE) ×2
GRAFT SKIN MARIGEN MICRO 38 (Tissue) IMPLANT
KIT BASIN OR (CUSTOM PROCEDURE TRAY) ×1 IMPLANT
KIT TURNOVER KIT B (KITS) ×1 IMPLANT
MANIFOLD NEPTUNE II (INSTRUMENTS) ×1 IMPLANT
NS IRRIG 1000ML POUR BTL (IV SOLUTION) ×1 IMPLANT
PACK ORTHO EXTREMITY (CUSTOM PROCEDURE TRAY) ×1 IMPLANT
PAD ARMBOARD 7.5X6 YLW CONV (MISCELLANEOUS) ×1 IMPLANT
PREVENA RESTOR ARTHOFORM 46X30 (CANNISTER) ×1 IMPLANT
SPONGE T-LAP 18X18 ~~LOC~~+RFID (SPONGE) IMPLANT
STAPLER VISISTAT 35W (STAPLE) IMPLANT
STOCKINETTE IMPERVIOUS LG (DRAPES) ×1 IMPLANT
SUT ETHILON 2 0 PSLX (SUTURE) IMPLANT
SUT SILK 2 0 (SUTURE) ×1
SUT SILK 2-0 18XBRD TIE 12 (SUTURE) ×1 IMPLANT
SUT VIC AB 1 CTX 27 (SUTURE) ×2 IMPLANT
TOWEL GREEN STERILE (TOWEL DISPOSABLE) ×1 IMPLANT
TUBE CONNECTING 12X1/4 (SUCTIONS) ×1 IMPLANT
YANKAUER SUCT BULB TIP NO VENT (SUCTIONS) ×1 IMPLANT

## 2022-05-29 NOTE — Progress Notes (Signed)
Physical Therapy Evaluation Patient Details Name: Kathryn Beck MRN: [id number] DOB: March 13, 1956 Today's Date: 05/29/2022  History of Present Illness  67 yo female with onset of gangrene R foot was admitted on 3/8 for R BKA.  Pt is referred to PT for mobility and safety education.  PMHx:  PVD, R foot ulcer, OA, anemia, stroke, HD with Lupper arm HD port, PNA, Covid 19, DM, hypoxia, HTN, HA, cardiac cath, back pain, depression  Clinical Impression  Pt is getting up to side of bed with PT day zero with nursing permission, sitting with limb protector on side with no real pain complaints yet.  Her plan is to get permission for CIR admission, with family supporting the idea for her recovery as she is going to be home alone often.  Follow along with her with goals of acute PT which are outlined below, encompassing her need to get up a step, to maneuver in her home without help and to recover safe independent transfers as she progresses toward prosthetic fitting.        Recommendations for follow up therapy are one component of a multi-disciplinary discharge planning process, led by the attending physician.  Recommendations may be updated based on patient status, additional functional criteria and insurance authorization.  Follow Up Recommendations Acute inpatient rehab (3hours/day)      Assistance Recommended at Discharge Frequent or constant Supervision/Assistance  Patient can return home with the following  A little help with walking and/or transfers;A little help with bathing/dressing/bathroom;Assistance with cooking/housework;Assist for transportation;Help with stairs or ramp for entrance    Equipment Recommendations None recommended by PT  Recommendations for Other Services  Rehab consult    Functional Status Assessment Patient has had a recent decline in their functional status and demonstrates the ability to make significant improvements in function in a reasonable and predictable amount of  time.     Precautions / Restrictions Precautions Precautions: Fall Precaution Comments: monitor O2 sats Required Braces or Orthoses: Other Brace Other Brace: limb protector Restrictions Weight Bearing Restrictions: Yes RLE Weight Bearing: Non weight bearing      Mobility  Bed Mobility Overal bed mobility: Needs Assistance Bed Mobility: Supine to Sit, Sit to Supine     Supine to sit: Supervision, HOB elevated Sit to supine: Min assist   General bed mobility comments: used HOB elevation and bed rail to sit on side of bed    Transfers                   General transfer comment: deferred    Ambulation/Gait                  Stairs            Wheelchair Mobility    Modified Rankin (Stroke Patients Only)       Balance                                             Pertinent Vitals/Pain Pain Assessment Pain Assessment: 0-10 Pain Score: 1  Pain Location: RLE Pain Descriptors / Indicators: Sore Pain Intervention(s): Monitored during session, Repositioned, Premedicated before session    Home Living Family/patient expects to be discharged to:: Private residence Living Arrangements: Spouse/significant other Available Help at Discharge: Family;Friend(s);Available PRN/intermittently Type of Home: House Home Access: Stairs to enter Entrance Stairs-Rails: Left Entrance Stairs-Number of Steps: 1+1 partial  rails   Home Layout: One level Home Equipment: Conservation officer, nature (2 wheels);Cane - single point;BSC/3in1;Wheelchair - manual;Grab bars - tub/shower      Prior Function Prior Level of Function : Independent/Modified Independent             Mobility Comments: up to walk with RW ADLs Comments: I with ADL's     Hand Dominance   Dominant Hand: Right    Extremity/Trunk Assessment   Upper Extremity Assessment Upper Extremity Assessment: Overall WFL for tasks assessed    Lower Extremity Assessment Lower Extremity  Assessment: RLE deficits/detail RLE Deficits / Details: new BK amputation RLE Coordination: decreased gross motor    Cervical / Trunk Assessment Cervical / Trunk Assessment: Kyphotic (mild change)  Communication   Communication: No difficulties  Cognition Arousal/Alertness: Awake/alert Behavior During Therapy: WFL for tasks assessed/performed Overall Cognitive Status: Within Functional Limits for tasks assessed                                 General Comments: pt is motivated and works with PT immediately upon arrival        General Comments General comments (skin integrity, edema, etc.): Pt sat up on side of bed but was mildly light headed, returned to bed with help    Exercises General Exercises - Lower Extremity Ankle Circles/Pumps: AROM, Left Quad Sets: AROM, Both, 10 reps Gluteal Sets: AROM, Both, 10 reps   Assessment/Plan    PT Assessment Patient needs continued PT services  PT Problem List Decreased strength;Decreased range of motion;Decreased activity tolerance;Decreased balance;Decreased coordination;Decreased mobility;Decreased skin integrity;Pain       PT Treatment Interventions DME instruction;Gait training;Functional mobility training;Therapeutic activities;Therapeutic exercise;Balance training;Neuromuscular re-education;Patient/family education;Stair training    PT Goals (Current goals can be found in the Care Plan section)  Acute Rehab PT Goals Patient Stated Goal: to get to walk again PT Goal Formulation: With patient Time For Goal Achievement: 06/12/22 Potential to Achieve Goals: Good    Frequency Min 3X/week     Co-evaluation               AM-PAC PT "6 Clicks" Mobility  Outcome Measure Help needed turning from your back to your side while in a flat bed without using bedrails?: A Little Help needed moving from lying on your back to sitting on the side of a flat bed without using bedrails?: A Little Help needed moving to and  from a bed to a chair (including a wheelchair)?: A Little Help needed standing up from a chair using your arms (e.g., wheelchair or bedside chair)?: A Lot Help needed to walk in hospital room?: A Lot Help needed climbing 3-5 steps with a railing? : Total 6 Click Score: 14    End of Session   Activity Tolerance: Patient tolerated treatment well;Patient limited by fatigue Patient left: in bed;with call bell/phone within reach;with bed alarm set Nurse Communication: Mobility status PT Visit Diagnosis: Other abnormalities of gait and mobility (R26.89);Muscle weakness (generalized) (M62.81);Difficulty in walking, not elsewhere classified (R26.2);Pain Pain - Right/Left: Right Pain - part of body: Leg    Time: [id number] PT Time Calculation (min) (ACUTE ONLY): 33 min   Charges:   PT Evaluation $PT Eval Moderate Complexity: 1 Mod PT Treatments $Therapeutic Activity: 8-22 mins       Ramond Dial 05/29/2022, 4:26 PM  Mee Hives, PT PhD Acute Rehab Dept. Number: [place]

## 2022-05-29 NOTE — Anesthesia Procedure Notes (Signed)
Anesthesia Regional Block: Popliteal block   Pre-Anesthetic Checklist: , timeout performed,  Correct Patient, Correct Site, Correct Laterality,  Correct Procedure, Correct Position, site marked,  Risks and benefits discussed,  Surgical consent,  Pre-op evaluation,  At surgeon's request and post-op pain management  Laterality: Right  Prep: Maximum Sterile Barrier Precautions used, chloraprep       Needles:  Injection technique: Single-shot  Needle Type: Echogenic Stimulator Needle     Needle Length: 9cm  Needle Gauge: 22     Additional Needles:   Procedures:,,,, ultrasound used (permanent image in chart),,    Narrative:  Start time: 05/29/2022 8:20 AM End time: 05/29/2022 8:25 AM Injection made incrementally with aspirations every 5 mL.  Performed by: Personally  Anesthesiologist: Pervis Hocking, DO  Additional Notes: Monitors applied. No increased pain on injection. No increased resistance to injection. Injection made in 5cc increments. Good needle visualization. Patient tolerated procedure well.

## 2022-05-29 NOTE — TOC Initial Note (Signed)
Transition of Care [facility]) - Initial/Assessment Note    Patient Details  Name: Kathryn Beck MRN: [id number] Date of Birth: 1955/06/08  Transition of Care [facility]) CM/SW Contact:    Verdell Carmine, RN Phone Number: 05/29/2022, 12:50 PM  Clinical Narrative:                 67 yo female with gangrenous foot, presented for amputation. Has family support . Awaiting PT OT evalution for recommendations.     Barriers to Discharge: Continued Medical Work up   Patient Goals and CMS Choice            Expected Discharge Plan and Services       Living arrangements for the past 2 months: [place]                                      Prior Living Arrangements/Services Living arrangements for the past 2 months: [place]   Patient language and need for interpreter reviewed:: Yes        Need for Family Participation in Patient Care: Yes (Comment) Care giver support system in place?: Yes (comment)   Criminal Activity/Legal Involvement Pertinent to Current Situation/Hospitalization: No - Comment as needed  Activities of Daily Living      Permission Sought/Granted                  Emotional Assessment       Orientation: : Oriented to Self, Oriented to Place Alcohol / Substance Use: Not Applicable Psych Involvement: No (comment)  Admission diagnosis:  S/P BKA (below knee amputation), right ([place]) [Z89.511] Patient Active Problem List   Diagnosis Date Noted   S/P BKA (below knee amputation), right ([place]) 05/29/2022   Confusion 05/19/2022   Visual hallucinations 05/19/2022   Watery eyes 05/19/2022   Requires assistance with activities of daily living (ADL) 04/26/2022   Diabetic wet gangrene of the foot ([place]) 04/25/2022   Gangrene of right foot ([place]) 04/25/2022   PAD (peripheral artery disease) ([place]) 04/25/2022   Other osteoporosis without current pathological fracture 11/10/2021   Hypocalcemia 11/10/2021   Fall 11/02/2021   Left foot  pain 10/31/2021   Low back pain with left-sided sciatica 10/31/2021   Left ear impacted cerumen 09/18/2021   Chronic left shoulder pain 06/10/2021   Abnormal CXR 03/10/2021   Ear pain, bilateral 03/10/2021   Morbid obesity ([place]) 03/10/2021   Subungual hematoma of second toe of left foot 10/28/2020   Insomnia 10/28/2020   Memory loss or impairment 09/26/2020   Forgetfulness 09/26/2020   Recurrent vertigo 09/26/2020   Abnormal MRI, cervical spine 03/04/2020   Right leg weakness 02/28/2020   Knee pain, right 10/23/2019   Cough 08/09/2019   Carpal tunnel syndrome 06/13/2019   Chronic pain syndrome 06/13/2019   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[place]) 06/13/2019   Neck pain 03/23/2019   Near syncope 12/05/2018   ESRD on hemodialysis ([place]) 11/22/2018   Ischemic heart disease 09/25/2018   Not currently working due to disabled status 06/13/2018   ESRD on dialysis ([place]) 06/12/2018   HTN (hypertension) 06/12/2018   Depression 03/09/2018   Adrenal mass, left ([place]) 11/09/2017   MGUS (monoclonal gammopathy of unknown significance) 11/05/2017   Shoulder pain 11/01/2017   Chronic diastolic (congestive) heart failure ([place]) 09/20/2017   Bilateral leg weakness 09/09/2017   Adrenal adenoma 09/03/2017   Uncontrolled  type 2 diabetes mellitus 08/18/2017   Unsteady gait 07/11/2017   Recurrent falls 07/11/2017   Anemia of chronic disease 03/24/2017   Personal history of colonic polyps 02/10/2017   Dysphagia 11/05/2016   Irritable bowel syndrome 02/04/2016   Hospital discharge follow-up 10/27/2015   Intolerant of heat 10/07/2014   Cardiac murmur 01/17/2014   Back pain with left-sided radiculopathy 09/18/2013   Seasonal allergies 03/10/2013   Lipoma of back 12/22/2012   Other seasonal allergic rhinitis 08/22/2012   OSA (obstructive sleep apnea) 06/02/2011   Chronic kidney disease, stage 4 (severe) ([place]) 01/13/2011   Neuropathy 04/16/2010   Malaise and fatigue 07/24/2009    Personal history of other diseases of the nervous system and sense organs 05/08/2009   LUPUS ERYTHEMATOSUS, DISCOID 06/05/2008   Arm pain 05/30/2008   Hyperlipidemia 06/07/2007   Obesity 06/07/2007   Malignant hypertension 06/07/2007   Type 2 diabetes mellitus with hyperglycemia ([place]) 06/07/2007   Diabetes mellitus ([place]) 06/07/2007   PCP:  Fayrene Helper, MD Pharmacy:   [place], [place] - [address] [place] [address] Phone: [phone] Fax: [phone]  [facility] - [place] [place] [address] Phone: [phone] Fax: [phone]  [id number] [place] [id number]: [phone]: [phone]    Social Determinants of Health (SDOH) Social History: SDOH Screenings   Food Insecurity: No Food Insecurity (05/12/2022)  Housing: Low Risk  (05/12/2022)  Transportation Needs: No Transportation Needs (05/12/2022)  Utilities: Not At Risk (05/12/2022)  Alcohol Screen: Low Risk  (05/12/2022)  Depression (PHQ2-9): High Risk (05/12/2022)  Financial Resource Strain: Medium Risk (05/12/2022)  Physical Activity: Inactive (05/12/2022)  Social Connections: Socially Integrated (05/12/2022)  Stress: No Stress Concern Present (05/12/2022)  Tobacco Use: Low Risk  (05/29/2022)   SDOH Interventions:     Readmission Risk Interventions    05/01/2022    3:54 PM  Readmission Risk Prevention Plan  Transportation Screening Complete  [place] or [place]  Palliative Care Screening Not Applicable  Medication Review (RN Care Manager) Complete

## 2022-05-29 NOTE — Anesthesia Preprocedure Evaluation (Addendum)
Anesthesia Evaluation  Patient identified by MRN, date of birth, ID band Patient awake    Reviewed: Allergy & Precautions, NPO status , Patient's Chart, lab work & pertinent test results, reviewed documented beta blocker date and time   Airway Mallampati: IV  TM Distance: >3 FB Neck ROM: Full    Dental  (+) Dental Advisory Given, Teeth Intact   Pulmonary sleep apnea (noncompliant w/ cpap, in the process of getting a new one)    Pulmonary exam normal breath sounds clear to auscultation       Cardiovascular hypertension (124/75 preop), Pt. on medications and Pt. on home beta blockers + Peripheral Vascular Disease and +CHF (grade 2 diastolic dysfunction)  Normal cardiovascular exam+ Valvular Problems/Murmurs (mild AS) AS  Rhythm:Regular Rate:Normal  Echo 05/2022  1. Left ventricular ejection fraction, by estimation, is 60 to 65%. The  left ventricle has normal function. The left ventricle has no regional  wall motion abnormalities. There is moderate concentric left ventricular  hypertrophy. Left ventricular  diastolic parameters are consistent with Grade II diastolic dysfunction  (pseudonormalization). Elevated left ventricular end-diastolic pressure.   2. Right ventricular systolic function is normal. The right ventricular  size is mildly enlarged. There is normal pulmonary artery systolic  pressure.   3. Left atrial size was moderately dilated.   4. Right atrial size was mildly dilated.   5. The mitral valve is grossly normal. Trivial mitral valve  regurgitation. The mean mitral valve gradient is 4.0 mmHg. Moderate mitral  annular calcification.   6. The aortic valve is tricuspid. There is moderate calcification of the  aortic valve. There is mild thickening of the aortic valve. Aortic valve  regurgitation is trivial. Mild aortic valve stenosis.   7. The inferior vena cava is normal in size with greater than 50%  respiratory  variability, suggesting right atrial pressure of 3 mmHg.      Neuro/Psych  Headaches PSYCHIATRIC DISORDERS  Depression    CVA, No Residual Symptoms    GI/Hepatic Neg liver ROS,GERD  Controlled,,  Endo/Other  diabetes, Poorly Controlled, Type 2, Insulin Dependent  FS 213 this AM, s/p 2 units of insulin A1c 9.4  Renal/GU ESRF and DialysisRenal disease  negative genitourinary   Musculoskeletal  (+) Arthritis , Osteoarthritis,  Gangrene R foot    Abdominal  (+) + obese  Peds  Hematology negative hematology ROS (+)   Anesthesia Other Findings   Reproductive/Obstetrics negative OB ROS                              Anesthesia Physical Anesthesia Plan  ASA: 3  Anesthesia Plan: General and Regional   Post-op Pain Management: Regional block* and Tylenol PO (pre-op)*   Induction: Intravenous  PONV Risk Score and Plan: 3 and Ondansetron, Dexamethasone, Midazolam and Treatment may vary due to age or medical condition  Airway Management Planned: LMA  Additional Equipment: None  Intra-op Plan:   Post-operative Plan: Extubation in OR  Informed Consent: I have reviewed the patients History and Physical, chart, labs and discussed the procedure including the risks, benefits and alternatives for the proposed anesthesia with the patient or authorized representative who has indicated his/her understanding and acceptance.     Dental advisory given  Plan Discussed with: CRNA  Anesthesia Plan Comments:          Anesthesia Quick Evaluation

## 2022-05-29 NOTE — Progress Notes (Signed)
Inpatient Rehab Admissions Coordinator:  Consult received. Await therapy evaluations and recommendations to help determine appropriate rehab venue.  Carnella Fryman Graves Madden, MS, CCC-SLP Admissions Coordinator [phone]  

## 2022-05-29 NOTE — Anesthesia Procedure Notes (Signed)
Procedure Name: LMA Insertion Date/Time: 05/29/2022 9:37 AM  Performed by: Inda Coke, CRNAPre-anesthesia Checklist: Patient identified, Emergency Drugs available, Suction available and Patient being monitored Patient Re-evaluated:Patient Re-evaluated prior to induction Oxygen Delivery Method: Circle System Utilized Preoxygenation: Pre-oxygenation with 100% oxygen Induction Type: IV induction Ventilation: Mask ventilation without difficulty LMA: LMA inserted LMA Size: 4.0 Number of attempts: 1 Placement Confirmation: positive ETCO2 Tube secured with: Tape Dental Injury: Teeth and Oropharynx as per pre-operative assessment

## 2022-05-29 NOTE — Interval H&P Note (Signed)
History and Physical Interval Note:  05/29/2022 9:24 AM  Kathryn Beck  has presented today for surgery, with the diagnosis of Gangrene Right Foot.  The various methods of treatment have been discussed with the patient and family. After consideration of risks, benefits and other options for treatment, the patient has consented to  Procedure(s): RIGHT BELOW KNEE AMPUTATION (Right) as a surgical intervention.  The patient's history has been reviewed, patient examined, no change in status, stable for surgery.  I have reviewed the patient's chart and labs.  Questions were answered to the patient's satisfaction.     [name]

## 2022-05-29 NOTE — Anesthesia Procedure Notes (Signed)
Anesthesia Regional Block: Adductor canal block   Pre-Anesthetic Checklist: , timeout performed,  Correct Patient, Correct Site, Correct Laterality,  Correct Procedure, Correct Position, site marked,  Risks and benefits discussed,  Surgical consent,  Pre-op evaluation,  At surgeon's request and post-op pain management  Laterality: Right  Prep: Maximum Sterile Barrier Precautions used, chloraprep       Needles:  Injection technique: Single-shot  Needle Type: Echogenic Stimulator Needle     Needle Length: 9cm  Needle Gauge: 22     Additional Needles:   Procedures:,,,, ultrasound used (permanent image in chart),,    Narrative:  Start time: 05/29/2022 8:25 AM End time: 05/29/2022 8:30 AM Injection made incrementally with aspirations every 5 mL.  Performed by: Personally  Anesthesiologist: Pervis Hocking, DO  Additional Notes: Monitors applied. No increased pain on injection. No increased resistance to injection. Injection made in 5cc increments. Good needle visualization. Patient tolerated procedure well.

## 2022-05-29 NOTE — H&P (Signed)
Kathryn Beck is an 67 y.o. female.   Chief Complaint: Pain and ulceration right foot HPI: Patient is a 67 year old woman with gangrene of the right foot with peripheral vascular disease. She has undergone catheterization of the lower extremity in December 12. She has had duplex scans of both lower extremities February 9. She is currently using Betadine and dry dressing changes.   Past Medical History:  Diagnosis Date   Acid reflux    Anemia    Arthritis    Axillary masses    Soft tissue - status post excision   Back pain    COVID-19 virus infection 04/06/2019   Depression    End-stage renal disease ([place])    M/W/F dialysis   Essential hypertension    Headache    years ago   History of blood transfusion    History of cardiac catheterization    Normal coronary arteries October 2020   History of claustrophobia    History of pneumonia 2019   Hypoxia 04/03/2019   Mixed hyperlipidemia    Obesity    Pancreatitis    Peritoneal dialysis catheter in place [facility])    Pneumonia due to COVID-19 virus 04/02/2019   Sleep apnea    Noncompliant with CPAP   Stroke ([place])    mini stroke   Type 2 diabetes mellitus ([place])     Past Surgical History:  Procedure Laterality Date   ABDOMINAL AORTOGRAM W/LOWER EXTREMITY N/A 04/30/2022   Procedure: ABDOMINAL AORTOGRAM W/LOWER EXTREMITY;  Surgeon: Cherre Robins, MD;  Location: [place] [facility];  Service: Cardiovascular;  Laterality: N/A;   ABDOMINAL HYSTERECTOMY     AV FISTULA PLACEMENT Left 09/02/2017   Procedure: creation of left arm ARTERIOVENOUS (AV) FISTULA;  Surgeon: Serafina Mitchell, MD;  Location: [facility] OR;  Service: Vascular;  Laterality: Left;   COLONOSCOPY  2008   Dr. Oneida Alar: normal    COLONOSCOPY N/A 12/18/2016   Dr. Oneida Alar: multiple tubular adenomas, internal hemorrhoids. Surveillance in 3 years    ESOPHAGEAL DILATION N/A 10/13/2015   Procedure: ESOPHAGEAL DILATION;  Surgeon: Rogene Houston, MD;  [facility];  Service:  Endoscopy;  Laterality: N/A;   ESOPHAGOGASTRODUODENOSCOPY N/A 10/13/2015   Dr. Laural Golden: chronic gastritis on path, no H.pylori. Empiric dilation    ESOPHAGOGASTRODUODENOSCOPY N/A 12/18/2016   Dr. Oneida Alar: mild gastritis. BRAVO study revealed uncontrolled GERD. Dysphagia secondary to uncontrolled reflux   FOOT SURGERY Bilateral    "nerve"     LEFT HEART CATH AND CORONARY ANGIOGRAPHY N/A 12/29/2018   Procedure: LEFT HEART CATH AND CORONARY ANGIOGRAPHY;  Surgeon: Jettie Booze, MD;  Location: [place] [facility];  Service: Cardiovascular;  Laterality: N/A;   LOWER EXTREMITY ANGIOGRAPHY Right 05/04/2022   Procedure: Lower Extremity Angiography;  Surgeon: Broadus John, MD;  Location: [place] [facility];  Service: Cardiovascular;  Laterality: Right;   LUNG BIOPSY     MASS EXCISION Right 01/09/2013   Procedure: EXCISION OF NEOPLASM OF RIGHT  AXILLA  AND EXCISION OF NEOPLASM OF LEFT AXILLA;  Surgeon: Jamesetta So, MD;  Location: [facility];  Service: General;  Laterality: Right;  procedure end @ 08:23   MYRINGOTOMY WITH TUBE PLACEMENT Bilateral 04/28/2017   Procedure: BILATERAL MYRINGOTOMY WITH TUBE PLACEMENT;  Surgeon: Leta Baptist, MD;  Location: [place];  Service: ENT;  Laterality: Bilateral;   PERIPHERAL VASCULAR BALLOON ANGIOPLASTY Right 05/04/2022   Procedure: PERIPHERAL VASCULAR BALLOON ANGIOPLASTY;  Surgeon: Broadus John, MD;  Location: [place] [facility];  Service: Cardiovascular;  Laterality: Right;  PT   PERIPHERAL VASCULAR INTERVENTION Right 05/04/2022   Procedure: PERIPHERAL VASCULAR INTERVENTION;  Surgeon: Broadus John, MD;  Location: [place] [facility];  Service: Cardiovascular;  Laterality: Right;  SFA   REVISION OF ARTERIOVENOUS GORETEX GRAFT Left 05/04/2018   Procedure: TRANSPOSITION OF CEPHALIC VEIN ARTERIOVENOUS FISTULA LEFT ARM;  Surgeon: Rosetta Posner, MD;  Location: [facility];  Service: Vascular;  Laterality: Left;   SAVORY DILATION N/A 12/18/2016   Procedure: SAVORY DILATION;   Surgeon: Danie Binder, MD;  [facility];  Service: Endoscopy;  Laterality: N/A;    Family History  Problem Relation Age of Onset   Hypertension Father    Hypercholesterolemia Father    Arthritis Father    Hypertension Sister    Hypercholesterolemia Sister    Breast cancer Sister    Hypertension Sister    Colon cancer Neg Hx    Colon polyps Neg Hx    Social History:  reports that she has never smoked. She has never been exposed to tobacco smoke. She has never used smokeless tobacco. She reports that she does not drink alcohol and does not use drugs.  Allergies:  Allergies  Allergen Reactions   Ace Inhibitors Anaphylaxis and Swelling   Penicillins Itching, Swelling and Other (See Comments)    Did it involve swelling of the face/tongue/throat, SOB, or low BP? Unknown Did it involve sudden or severe rash/hives, skin peeling, or any reaction on the inside of your mouth or nose? Unknown Did you need to seek medical attention at a hospital or doctor's office? Unknown When did it last happen?      years  If all above answers are "NO", may proceed with cephalosporin use.    Statins Other (See Comments)    elevated LFT's     Albuterol Swelling    No medications prior to admission.    No results found. However, due to the size of the patient record, not all encounters were searched. Please check Results Review for a complete set of results. ECHOCARDIOGRAM COMPLETE  Result Date: 05/28/2022    ECHOCARDIOGRAM REPORT   Patient Name:   Kathryn Beck Date of Exam: 05/28/2022 Medical Rec #:  [id number]       Height:       64.0 in Accession #:    [id number]      Weight:       191.0 lb Date of Birth:  [birth year]       BSA:          1.918 m Patient Age:    73 years        BP:           178/69 mmHg Patient Gender: F               HR:           86 bpm. Exam Location:  Outpatient Procedure: 2D Echo, 3D Echo, Cardiac Doppler, Color Doppler and Strain Analysis Indications:    R01.1 Murmur   History:        Patient has prior history of Echocardiogram examinations, most                 recent 01/17/2019. Signs/Symptoms:Murmur and Syncope; Risk                 Factors:Diabetes, Hypertension, Dyslipidemia and Non-Smoker.                 ESRD.  Sonographer:    Leavy Cella RDCS Referring Phys: [id number] Dresser  1. Left ventricular ejection fraction, by estimation, is 60 to 65%. The left ventricle has normal function. The left ventricle has no regional wall motion abnormalities. There is moderate concentric left ventricular hypertrophy. Left ventricular diastolic parameters are consistent with Grade II diastolic dysfunction (pseudonormalization). Elevated left ventricular end-diastolic pressure.  2. Right ventricular systolic function is normal. The right ventricular size is mildly enlarged. There is normal pulmonary artery systolic pressure.  3. Left atrial size was moderately dilated.  4. Right atrial size was mildly dilated.  5. The mitral valve is grossly normal. Trivial mitral valve regurgitation. The mean mitral valve gradient is 4.0 mmHg. Moderate mitral annular calcification.  6. The aortic valve is tricuspid. There is moderate calcification of the aortic valve. There is mild thickening of the aortic valve. Aortic valve regurgitation is trivial. Mild aortic valve stenosis.  7. The inferior vena cava is normal in size with greater than 50% respiratory variability, suggesting right atrial pressure of 3 mmHg. Comparison(s): No significant change from prior study. Conclusion(s)/Recommendation(s): Otherwise normal echocardiogram, with minor abnormalities described in the report. FINDINGS  Left Ventricle: Left ventricular ejection fraction, by estimation, is 60 to 65%. The left ventricle has normal function. The left ventricle has no regional wall motion abnormalities. The left ventricular internal cavity size was normal in size. There is  moderate concentric left ventricular  hypertrophy. Left ventricular diastolic parameters are consistent with Grade II diastolic dysfunction (pseudonormalization). Elevated left ventricular end-diastolic pressure. Right Ventricle: The right ventricular size is mildly enlarged. Right vetricular wall thickness was not well visualized. Right ventricular systolic function is normal. There is normal pulmonary artery systolic pressure. The tricuspid regurgitant velocity  is 1.57 m/s, and with an assumed right atrial pressure of 3 mmHg, the estimated right ventricular systolic pressure is 0000000 mmHg. Left Atrium: Left atrial size was moderately dilated. Right Atrium: Right atrial size was mildly dilated. Pericardium: There is no evidence of pericardial effusion. Mitral Valve: The mitral valve is grossly normal. There is mild thickening of the mitral valve leaflet(s). There is mild calcification of the mitral valve leaflet(s). Moderate mitral annular calcification. Trivial mitral valve regurgitation. MV peak gradient, 9.2 mmHg. The mean mitral valve gradient is 4.0 mmHg. Tricuspid Valve: The tricuspid valve is normal in structure. Tricuspid valve regurgitation is trivial. No evidence of tricuspid stenosis. Aortic Valve: The aortic valve is tricuspid. There is moderate calcification of the aortic valve. There is mild thickening of the aortic valve. Aortic valve regurgitation is trivial. Mild aortic stenosis is present. Aortic valve mean gradient measures 13.0 mmHg. Aortic valve peak gradient measures 21.2 mmHg. Aortic valve area, by VTI measures 1.68 cm. Pulmonic Valve: The pulmonic valve was grossly normal. Pulmonic valve regurgitation is trivial. No evidence of pulmonic stenosis. Aorta: The aortic root, ascending aorta, aortic arch and descending aorta are all structurally normal, with no evidence of dilitation or obstruction. Venous: The inferior vena cava is normal in size with greater than 50% respiratory variability, suggesting right atrial pressure of 3  mmHg. IAS/Shunts: The atrial septum is grossly normal.  LEFT VENTRICLE PLAX 2D LVIDd:         4.90 cm   Diastology LVIDs:         2.37 cm   LV e' medial:    4.79 cm/s LV PW:         1.57 cm   LV E/e' medial:  25.9 LV IVS:  1.52 cm   LV e' lateral:   6.64 cm/s LVOT diam:     2.10 cm   LV E/e' lateral: 18.7 LV SV:         76 LV SV Index:   40 LVOT Area:     3.46 cm                           3D Volume EF:                          3D EF:        61 %                          LV EDV:       210 ml                          LV ESV:       83 ml                          LV SV:        127 ml RIGHT VENTRICLE RV Basal diam:  4.88 cm RV Mid diam:    2.91 cm RV S prime:     18.50 cm/s TAPSE (M-mode): 4.0 cm LEFT ATRIUM             Index        RIGHT ATRIUM           Index LA diam:        5.20 cm 2.71 cm/m   RA Area:     18.60 cm LA Vol (A2C):   99.6 ml 51.92 ml/m  RA Volume:   56.50 ml  29.45 ml/m LA Vol (A4C):   56.6 ml 29.50 ml/m LA Biplane Vol: 76.9 ml 40.08 ml/m  AORTIC VALVE AV Area (Vmax):    1.76 cm AV Area (Vmean):   1.47 cm AV Area (VTI):     1.68 cm AV Vmax:           230.00 cm/s AV Vmean:          176.000 cm/s AV VTI:            0.454 m AV Peak Grad:      21.2 mmHg AV Mean Grad:      13.0 mmHg LVOT Vmax:         117.00 cm/s LVOT Vmean:        74.500 cm/s LVOT VTI:          0.220 m LVOT/AV VTI ratio: 0.48  AORTA Ao Root diam: 3.20 cm Ao Asc diam:  3.50 cm MITRAL VALVE                TRICUSPID VALVE MV Area (PHT): 5.13 cm     TR Peak grad:   9.9 mmHg MV Area VTI:   2.09 cm     TR Vmax:        157.00 cm/s MV Peak grad:  9.2 mmHg MV Mean grad:  4.0 mmHg     SHUNTS MV Vmax:       1.52 m/s     Systemic VTI:  0.22 m MV Vmean:      92.2 cm/s    Systemic Diam: 2.10 cm MV Decel Time: 148 msec MR  Peak grad: 82.8 mmHg MR Vmax:      455.00 cm/s MV E velocity: 124.00 cm/s MV A velocity: 129.00 cm/s MV E/A ratio:  0.96 Buford Dresser MD Electronically signed by Buford Dresser MD Signature Date/Time:  05/28/2022/3:26:17 PM    Final    VAS US CAROTID  Result Date: 05/28/2022 Carotid Arterial Duplex Study Patient Name:  Kathryn Beck  Date of Exam:   05/28/2022 Medical Rec #: [id number]        Accession #:    [id number] Date of Birth: 05-22-1955        Patient Gender: F Patient Age:   41 years Exam Location:  [facility] Procedure:      VAS US CAROTID Referring Phys: Laurann Montana --------------------------------------------------------------------------------  Indications:       Carotid artery disease and patient denies any cerebrovascular                    symptoms. Risk Factors:      Hypertension, hyperlipidemia, Diabetes, no history of                    smoking. Comparison Study:  05/09/2015 RICA [id number] and LICA [id number] cm/sec. Performing Technologist: Leavy Cella RDCS  Examination Guidelines: A complete evaluation includes B-mode imaging, spectral Doppler, color Doppler, and power Doppler as needed of all accessible portions of each vessel. Bilateral testing is considered an integral part of a complete examination. Limited examinations for reoccurring indications may be performed as noted.  Right Carotid Findings: +----------+--------+--------+--------+------------------+--------+           PSV cm/sEDV cm/sStenosisPlaque DescriptionComments +----------+--------+--------+--------+------------------+--------+ CCA Prox  101     13                                         +----------+--------+--------+--------+------------------+--------+ CCA Mid   93      9                                          +----------+--------+--------+--------+------------------+--------+ CCA Distal93      13              heterogenous               +----------+--------+--------+--------+------------------+--------+ ICA Prox  64      15              smooth                     +----------+--------+--------+--------+------------------+--------+ ICA Mid   114     20                                          +----------+--------+--------+--------+------------------+--------+ ICA Distal117     16                                         +----------+--------+--------+--------+------------------+--------+ ECA       99      11                                         +----------+--------+--------+--------+------------------+--------+ +----------+--------+-------+----------------+-------------------+  PSV cm/sEDV cmsDescribe        Arm Pressure (mmHG) +----------+--------+-------+----------------+-------------------+ [id number]     2      Multiphasic, [id number]                 +----------+--------+-------+----------------+-------------------+ +---------+--------+--+--------+-+---------+ VertebralPSV cm/s69EDV cm/s7Antegrade +---------+--------+--+--------+-+---------+  Left Carotid Findings: +----------+--------+--------+--------+------------------+--------+           PSV cm/sEDV cm/sStenosisPlaque DescriptionComments +----------+--------+--------+--------+------------------+--------+ CCA Prox  67      13                                         +----------+--------+--------+--------+------------------+--------+ CCA Mid   85      12                                         +----------+--------+--------+--------+------------------+--------+ CCA Distal82      15                                         +----------+--------+--------+--------+------------------+--------+ ICA Prox  58      18              heterogenous               +----------+--------+--------+--------+------------------+--------+ ICA Mid   68      17                                         +----------+--------+--------+--------+------------------+--------+ ICA Distal65      14                                         +----------+--------+--------+--------+------------------+--------+ ECA       87      3                                           +----------+--------+--------+--------+------------------+--------+ +----------+--------+--------+-----------------------------+-------------------+           PSV cm/sEDV cm/sDescribe                     Arm Pressure (mmHG) +----------+--------+--------+-----------------------------+-------------------+ Subclavian203     73      Increased EDV and turbulent  255                                           flow consistent with known                                                 arteriovenous fistula  placement                                        +----------+--------+--------+-----------------------------+-------------------+ +---------+--------+--+--------+--+---------+ VertebralPSV cm/s86EDV cm/s12Antegrade +---------+--------+--+--------+--+---------+ Unable to complete left blood pressure due to diaylsis graft.  Summary: Right Carotid: The extracranial vessels were near-normal with only minimal wall                thickening or plaque. Left Carotid: The extracranial vessels were near-normal with only minimal wall               thickening or plaque. Increased EDV and turbulent flow in               subclavian consistent with known arteriovenous fistula placement. Vertebrals:  Bilateral vertebral arteries demonstrate antegrade flow. Subclavians: Left subclavian artery flow was disturbed. Normal flow hemodynamics              were seen in the right subclavian artery. *See table(s) above for measurements and observations.     Preliminary     Review of Systems  All other systems reviewed and are negative.   There were no vitals taken for this visit. Physical Exam  Patient is alert, oriented, no adenopathy, well-dressed, normal affect, normal respiratory effort. Examination patient with the Doppler has a strong biphasic anterior tibial pulse and a weak biphasic dorsalis pedis pulse.  She has dry gangrenous changes of the  right great toe.  Patient does not have revascularization options. Assessment/Plan 1. Gangrene of right foot ([place])       Plan: Discussed treatment options.  Patient and family state they would like to proceed with 1 surgery only.  Discussed that she should  have better healing with a transtibial amputation as opposed to a transmetatarsal amputation.  Recommended protein supplements twice a day.  Patient and family state they understand and wish to proceed with surgery at this time.  Patient and family wish to proceed with a transtibial amputation.  [name], MD 05/29/2022, 6:27 AM

## 2022-05-29 NOTE — Progress Notes (Signed)
Inpatient Diabetes Program Recommendations  AACE/ADA: New Consensus Statement on Inpatient Glycemic Control (2015)  Target Ranges:  Prepandial:   less than 140 mg/dL      Peak postprandial:   less than 180 mg/dL (1-2 hours)      Critically ill patients:  140 - 180 mg/dL   Lab Results  Component Value Date   GLUCAP 213 (H) 05/29/2022   HGBA1C 9.3 (A) 04/16/2022    Review of Glycemic Control  Latest Reference Range & Units 05/29/22 07:21  Glucose-Capillary 70 - 99 mg/dL 213 (H)   Diabetes history: DM  Outpatient Diabetes medications:  Fiasp 18-30 units tid with  meals Toujeo 80 units daily Current orders for Inpatient glycemic control:  None yet  Inpatient Diabetes Program Recommendations:    Post-surgery, if patient stays in the hospital, consider adding Semglee 40 units daily(1/2 home dose), Novolog moderate tid with meals and HS, and Novolog meal coverage 6 units tid with meals (hold if patient eats less than 50% or NPO).   Thanks,  Adah Perl, RN, BC-ADM Inpatient Diabetes Coordinator Pager [phone]  (8a-5p)

## 2022-05-29 NOTE — Transfer of Care (Signed)
Immediate Anesthesia Transfer of Care Note  Patient: Kathryn Beck  Procedure(s) Performed: RIGHT BELOW THE KNEE AMPUTATION (Right: Knee)  Patient Location: PACU  Anesthesia Type:GA combined with regional for post-op pain  Level of Consciousness: drowsy  Airway & Oxygen Therapy: Patient Spontanous Breathing and Patient connected to nasal cannula oxygen  Post-op Assessment: Report given to RN and Post -op Vital signs reviewed and stable  Post vital signs: Reviewed and stable  Last Vitals:  Vitals Value Taken Time  BP 118/61 05/29/22 1018  Temp    Pulse 72 05/29/22 1020  Resp 14 05/29/22 1020  SpO2 100 % 05/29/22 1020  Vitals shown include unvalidated device data.  Last Pain:  Vitals:   05/29/22 0719  TempSrc: Oral  PainSc: 0-No pain         Complications: No notable events documented.

## 2022-05-29 NOTE — Progress Notes (Signed)
PT Cancellation Note  Patient Details Name: Kathryn Beck MRN: [id number] DOB: March 25, 1955   Cancelled Treatment:    Reason Eval/Treat Not Completed: Other (comment).  New surgery and will retry as time and pt allow.   Ramond Dial 05/29/2022, 3:20 PM  Mee Hives, PT PhD Acute Rehab Dept. Number: [place] and [place]

## 2022-05-29 NOTE — Op Note (Signed)
05/29/2022  10:20 AM  PATIENT:  Kathryn Beck    PRE-OPERATIVE DIAGNOSIS:  Gangrene Right Foot  POST-OPERATIVE DIAGNOSIS:  Same  PROCEDURE:  RIGHT BELOW THE KNEE AMPUTATION Application of Kerecis micro graft 38 cm  Application of Prevena customizable and Prevena arthroform wound VAC dressings Application of Vive Wear stump shrinker and the Hanger limb protector  SURGEON:  [name], MD  ANESTHESIA:   General  PREOPERATIVE INDICATIONS:  [name] is a  67 y.o. female with a diagnosis of Gangrene Right Foot who failed conservative measures and elected for surgical management.    The risks benefits and alternatives were discussed with the patient preoperatively including but not limited to the risks of infection, bleeding, nerve injury, cardiopulmonary complications, the need for revision surgery, among others, and the patient was willing to proceed.  OPERATIVE IMPLANTS: Kerecis micro graft 38 cm .   OPERATIVE FINDINGS: severely calcified arteries  OPERATIVE PROCEDURE: Patient was brought to the operating room after undergoing a regional anesthetic.  After adequate levels anesthesia were obtained a thigh tourniquet was placed and the lower extremity was prepped using DuraPrep draped into a sterile field. The foot was draped out of the sterile field with impervious stockinette.  A timeout was called and the tourniquet inflated.  A transverse skin incision was made 12 cm distal to the tibial tubercle, the incision curved proximally, and a large posterior flap was created.  The tibia was transected just proximal to the skin incision and beveled anteriorly.  The fibula was transected just proximal to the tibial incision.  The sciatic nerve was pulled cut and allowed to retract.  The vascular bundles were suture ligated with 2-0 silk.  The tourniquet was deflated and hemostasis obtained.    Drill holes were placed through the tibia and fibula to secure the [facility] tissue graft and  the gastrocnemius fascia.    The Kerecis micro powder 38 cm was applied to the open wound that has a 200 cm surface area.    The deep and superficial fascial layers were closed using #1 Vicryl.  The skin was closed using staples.    The Prevena customizable dressing was applied this was overwrapped with the arthroform sponge.  Charlie Pitter was used to secure the sponges and the circumferential compression was secured to the skin with Dermatac.  This was connected to the wound VAC pump and had a good suction fit this was covered with a stump shrinker and a limb protector.  Patient was taken to the PACU in stable condition.   DISCHARGE PLANNING:  Antibiotic duration: 24-hour antibiotics  Weightbearing: Nonweightbearing on the operative extremity  Pain medication: Opioid pathway  Dressing care/ Wound VAC: Continue wound VAC with the Prevena plus pump at discharge for 1 week  Ambulatory devices: Walker or kneeling scooter  Discharge to: Discharge planning based on recommendations per physical therapy  Follow-up: In the office 1 week after discharge.

## 2022-05-29 NOTE — Anesthesia Postprocedure Evaluation (Signed)
Anesthesia Post Note  Patient: Kathryn Beck  Procedure(s) Performed: RIGHT BELOW THE KNEE AMPUTATION (Right: Knee)     Patient location during evaluation: PACU Anesthesia Type: Regional and General Level of consciousness: awake and alert, oriented and patient cooperative Pain management: pain level controlled Vital Signs Assessment: post-procedure vital signs reviewed and stable Respiratory status: spontaneous breathing, nonlabored ventilation and respiratory function stable Cardiovascular status: blood pressure returned to baseline and stable Postop Assessment: no apparent nausea or vomiting Anesthetic complications: no   No notable events documented.  Last Vitals:  Vitals:   05/29/22 1100 05/29/22 1115  BP: (!) 130/57 122/62  Pulse: 70 70  Resp: 13 14  Temp:    SpO2: 100% 99%    Last Pain:  Vitals:   05/29/22 1115  TempSrc:   PainSc: [place]

## 2022-05-30 ENCOUNTER — Encounter (HOSPITAL_COMMUNITY): Payer: Self-pay | Admitting: Orthopedic Surgery

## 2022-05-30 LAB — CBC
HCT: 28.5 % — ABNORMAL LOW (ref 36.0–46.0)
Hemoglobin: 8.6 g/dL — ABNORMAL LOW (ref 12.0–15.0)
MCH: 24.9 pg — ABNORMAL LOW (ref 26.0–34.0)
MCHC: 30.2 g/dL (ref 30.0–36.0)
MCV: 82.4 fL (ref 80.0–100.0)
Platelets: 249 10*3/uL (ref 150–400)
RBC: 3.46 MIL/uL — ABNORMAL LOW (ref 3.87–5.11)
RDW: 19.3 % — ABNORMAL HIGH (ref 11.5–15.5)
WBC: 12.5 10*3/uL — ABNORMAL HIGH (ref 4.0–10.5)
nRBC: 0.6 % — ABNORMAL HIGH (ref 0.0–0.2)

## 2022-05-30 LAB — GLUCOSE, CAPILLARY
Glucose-Capillary: 94 mg/dL (ref 70–99)
Glucose-Capillary: 125 mg/dL — ABNORMAL HIGH (ref 70–99)
Glucose-Capillary: 161 mg/dL — ABNORMAL HIGH (ref 70–99)
Glucose-Capillary: 89 mg/dL (ref 70–99)

## 2022-05-30 LAB — BASIC METABOLIC PANEL
Anion gap: 12 (ref 5–15)
BUN: 52 mg/dL — ABNORMAL HIGH (ref 8–23)
CO2: 24 mmol/L (ref 22–32)
Calcium: 8.8 mg/dL — ABNORMAL LOW (ref 8.9–10.3)
Chloride: 105 mmol/L (ref 98–111)
Creatinine, Ser: 8.02 mg/dL — ABNORMAL HIGH (ref 0.44–1.00)
GFR, Estimated: 5 mL/min — ABNORMAL LOW (ref >60)
Glucose, Bld: 101 mg/dL — ABNORMAL HIGH (ref 70–99)
Potassium: 4.9 mmol/L (ref 3.5–5.1)
Sodium: 141 mmol/L (ref 135–145)

## 2022-05-30 LAB — HEPATITIS B SURFACE ANTIGEN: Hepatitis B Surface Ag: NONREACTIVE

## 2022-05-30 MED ORDER — HYDROCODONE-ACETAMINOPHEN 5-325 MG PO TABS
1.0000 | ORAL_TABLET | Freq: Four times a day (QID) | ORAL | Status: DC | PRN
  Administered 2022-05-30 – 2022-06-05 (×6): 1 via ORAL
  Filled 2022-05-30 (×6): qty 1

## 2022-05-30 MED ORDER — CHLORHEXIDINE GLUCONATE CLOTH 2 % EX PADS
6.0000 | MEDICATED_PAD | Freq: Every day | CUTANEOUS | Status: DC
  Administered 2022-05-30 – 2022-06-09 (×6): 6 via TOPICAL

## 2022-05-30 NOTE — Progress Notes (Signed)
Inpatient Rehab Admissions:  Inpatient Rehab Consult received.  I met with patient and pt's husband Kathryn Beck at the bedside for rehabilitation assessment and to discuss goals and expectations of an inpatient rehab admission.  Pt was lethargic so spoke with husband. Discussed average length of stay, insurance authorization requirement, discharge home after completion of CIR. He acknowledged understanding. He would prefer to have pt's input on rehab venue before making a decision. Provided Kathryn Beck with [employer]. Will continue to follow.  Signed: Gayland Curry, [place], [place] Admissions Coordinator [phone]

## 2022-05-30 NOTE — Consult Note (Signed)
Renal Service Consult Note Kathryn Beck  Kathryn Beck 05/30/2022 Sol Blazing, MD Requesting Physician: Dr. Sharol Given   Reason for Consult: ESRD pt s/p R BKA HPI: The patient is a 67 y.o. year-old w/ PMH as below who presented for elective BKA surgery on the R leg. Surgery was done yesterday. We are asked to see for dialysis.   Pt seen in room. She missed HD yest due to surgery, last HD was Wed at [facility] in [place]. Pt denies any SOB, leg swelling. Just postop pain. No CP or abd pain.    ROS - denies CP, no joint pain, no HA, no blurry vision, no rash, no diarrhea, no nausea/ vomiting   Past Medical History  Past Medical History:  Diagnosis Date   Acid reflux    Anemia    Arthritis    Axillary masses    Soft tissue - status post excision   Back pain    COVID-19 virus infection 04/06/2019   Depression    End-stage renal disease ([place])    M/W/F dialysis   Essential hypertension    Headache    years ago   History of blood transfusion    History of cardiac catheterization    Normal coronary arteries October 2020   History of claustrophobia    History of pneumonia 2019   Hypoxia 04/03/2019   Mixed hyperlipidemia    Obesity    Pancreatitis    Peritoneal dialysis catheter in place [facility])    Pneumonia due to COVID-19 virus 04/02/2019   Sleep apnea    Noncompliant with CPAP   Stroke ([place])    mini stroke   Type 2 diabetes mellitus ([place])    Past Surgical History  Past Surgical History:  Procedure Laterality Date   ABDOMINAL AORTOGRAM W/LOWER EXTREMITY N/A 04/30/2022   Procedure: ABDOMINAL AORTOGRAM W/LOWER EXTREMITY;  Surgeon: Cherre Robins, MD;  Location: [place] [facility];  Service: Cardiovascular;  Laterality: N/A;   ABDOMINAL HYSTERECTOMY     AMPUTATION Right 05/29/2022   Procedure: RIGHT BELOW THE KNEE AMPUTATION;  Surgeon: [name], MD;  Location: [place];  Service: Orthopedics;  Laterality: Right;   AV FISTULA PLACEMENT Left 09/02/2017    Procedure: creation of left arm ARTERIOVENOUS (AV) FISTULA;  Surgeon: Serafina Mitchell, MD;  Location: [facility] OR;  Service: Vascular;  Laterality: Left;   COLONOSCOPY  2008   Dr. Oneida Alar: normal    COLONOSCOPY N/A 12/18/2016   Dr. Oneida Alar: multiple tubular adenomas, internal hemorrhoids. Surveillance in 3 years    ESOPHAGEAL DILATION N/A 10/13/2015   Procedure: ESOPHAGEAL DILATION;  Surgeon: Rogene Houston, MD;  [facility];  Service: Endoscopy;  Laterality: N/A;   ESOPHAGOGASTRODUODENOSCOPY N/A 10/13/2015   Dr. Laural Golden: chronic gastritis on path, no H.pylori. Empiric dilation    ESOPHAGOGASTRODUODENOSCOPY N/A 12/18/2016   Dr. Oneida Alar: mild gastritis. BRAVO study revealed uncontrolled GERD. Dysphagia secondary to uncontrolled reflux   FOOT SURGERY Bilateral    "nerve"     LEFT HEART CATH AND CORONARY ANGIOGRAPHY N/A 12/29/2018   Procedure: LEFT HEART CATH AND CORONARY ANGIOGRAPHY;  Surgeon: Jettie Booze, MD;  Location: [place] [facility];  Service: Cardiovascular;  Laterality: N/A;   LOWER EXTREMITY ANGIOGRAPHY Right 05/04/2022   Procedure: Lower Extremity Angiography;  Surgeon: Broadus John, MD;  Location: [place] [facility];  Service: Cardiovascular;  Laterality: Right;   LUNG BIOPSY     MASS EXCISION Right 01/09/2013   Procedure: EXCISION OF NEOPLASM  OF RIGHT  AXILLA  AND EXCISION OF NEOPLASM OF LEFT AXILLA;  Surgeon: Jamesetta So, MD;  Location: [facility];  Service: General;  Laterality: Right;  procedure end @ 08:23   MYRINGOTOMY WITH TUBE PLACEMENT Bilateral 04/28/2017   Procedure: BILATERAL MYRINGOTOMY WITH TUBE PLACEMENT;  Surgeon: Leta Baptist, MD;  Location: [place];  Service: ENT;  Laterality: Bilateral;   PERIPHERAL VASCULAR BALLOON ANGIOPLASTY Right 05/04/2022   Procedure: PERIPHERAL VASCULAR BALLOON ANGIOPLASTY;  Surgeon: Broadus John, MD;  Location: [place] [facility];  Service: Cardiovascular;  Laterality: Right;  PT   PERIPHERAL VASCULAR INTERVENTION Right 05/04/2022    Procedure: PERIPHERAL VASCULAR INTERVENTION;  Surgeon: Broadus John, MD;  Location: [place] [facility];  Service: Cardiovascular;  Laterality: Right;  SFA   REVISION OF ARTERIOVENOUS GORETEX GRAFT Left 05/04/2018   Procedure: TRANSPOSITION OF CEPHALIC VEIN ARTERIOVENOUS FISTULA LEFT ARM;  Surgeon: Rosetta Posner, MD;  [facility];  Service: Vascular;  Laterality: Left;   SAVORY DILATION N/A 12/18/2016   Procedure: SAVORY DILATION;  Surgeon: Danie Binder, MD;  [facility];  Service: Endoscopy;  Laterality: N/A;   Family History  Family History  Problem Relation Age of Onset   Hypertension Father    Hypercholesterolemia Father    Arthritis Father    Hypertension Sister    Hypercholesterolemia Sister    Breast cancer Sister    Hypertension Sister    Colon cancer Neg Hx    Colon polyps Neg Hx    Social History  reports that she has never smoked. She has never been exposed to tobacco smoke. She has never used smokeless tobacco. She reports that she does not drink alcohol and does not use drugs. Allergies  Allergies  Allergen Reactions   Ace Inhibitors Anaphylaxis and Swelling   Penicillins Itching, Swelling and Other (See Comments)    Did it involve swelling of the face/tongue/throat, SOB, or low BP? Unknown Did it involve sudden or severe rash/hives, skin peeling, or any reaction on the inside of your mouth or nose? Unknown Did you need to seek medical attention at a hospital or doctor's office? Unknown When did it last happen?      years  If all above answers are "NO", may proceed with cephalosporin use.    Statins Other (See Comments)    elevated LFT's     Albuterol Swelling   Home medications Prior to Admission medications   Medication Sig Start Date End Date Taking? Authorizing Provider  aspirin 81 MG chewable tablet CHEW 2 TABLETS BY MOUTH ONCE DAILY Patient taking differently: Chew 162 mg by mouth daily. 07/25/21  Yes Fayrene Helper, MD  azelastine  (OPTIVAR) 0.05 % ophthalmic solution Place 1 drop into both eyes 2 (two) times daily. 05/25/22  Yes Fayrene Helper, MD  B Complex-C-Folic Acid (RENA-VITE RX) 1 MG TABS Take 1 tablet by mouth daily. 04/15/22  Yes [provider]  calcitRIOL (ROCALTROL) 0.25 MCG capsule Take 1 capsule (0.25 mcg total) by mouth 2 (two) times daily with a meal. 05/14/22  Yes Nida, Marella Chimes, MD  clopidogrel (PLAVIX) 75 MG tablet Take 1 tablet (75 mg total) by mouth daily with breakfast. 05/08/22 08/06/22 Yes Ghimire, Dante Gang, MD  DULoxetine (CYMBALTA) 60 MG capsule TAKE (1) CAPSULE BY MOUTH ONCE DAILY. Patient taking differently: Take 60 mg by mouth daily. 09/17/20  Yes Fayrene Helper, MD  ezetimibe (ZETIA) 10 MG tablet Take 1 tablet (10 mg total) by mouth daily.  05/25/22  Yes Fayrene Helper, MD  furosemide (LASIX) 40 MG tablet Take 40 mg by mouth 2 (two) times daily.   Yes [provider]  hydrOXYzine (ATARAX) 25 MG tablet Take 25 mg by mouth every 12 (twelve) hours as needed for itching.   Yes [provider]  insulin aspart (FIASP FLEXTOUCH) 100 UNIT/ML FlexTouch Pen Inject 18-30 Units into the skin with breakfast, with lunch, and with evening meal. Patient taking differently: Inject 18-30 Units into the skin with breakfast, with lunch, and with evening meal. Sliding scale 05/25/22  Yes [name], MD  insulin glargine, 2 Unit Dial, (TOUJEO MAX SOLOSTAR) 300 UNIT/ML Solostar Pen Inject 80 Units into the skin daily. 05/25/22  Yes [name], MD  metoprolol succinate (TOPROL-XL) 50 MG 24 hr tablet TAKE (1) TABLET BY MOUTH DAILY WITH FOOD *TAKE AFTER DIALYSIS* Patient taking differently: Take 50 mg by mouth every evening. 07/29/21  Yes Fayrene Helper, MD  pregabalin (LYRICA) 50 MG capsule TAKE ONE CAPSULE BY MOUTH three times a day Patient taking differently: Take 50 mg by mouth 3 (three) times daily. 02/10/22  Yes Sanjuana Kava, MD  sertraline (ZOLOFT) 50 MG tablet Take  1 tablet (50 mg total) by mouth daily. 05/25/22  Yes Fayrene Helper, MD  [name] 500 MG chewable tablet Chew 500-1,000 mg by mouth See admin instructions. Take '1000mg'$  (2 tablets) by mouth with meals and '500mg'$  (1 tablet) with snacks 04/15/22  Yes [provider]  Blood Glucose Monitoring Suppl (ONETOUCH VERIO) w/Device KIT Use to check blood sugar 4X daily. 04/16/22   [name], MD  Continuous Blood Gluc Receiver (FREESTYLE LIBRE 2 READER) DEVI 1 each by Does not apply route daily. 04/16/22   [name], MD  Continuous Blood Gluc Sensor (FREESTYLE LIBRE 2 SENSOR) MISC 1 each by Does not apply route every 14 (fourteen) days. 04/16/22   [name], MD  glucose blood (ONETOUCH VERIO) test strip Use as instructed to check blood sugar 4X daily. 04/16/22   [name], MD  Insulin Pen Needle 32G X 4 MM MISC Use 4x a day 05/25/22   [name], MD  OneTouch Delica Lancets [id number] MISC Use to check blood sugar 4X daily. 04/16/22   [name], MD  FLUoxetine (PROZAC) 10 MG capsule Take 10 mg by mouth daily.    05/28/11  [provider]  glipiZIDE (GLUCOTROL) 10 MG tablet Take 10 mg by mouth 2 (two) times daily before a meal.    05/28/11  [provider]     Vitals:   05/29/22 2135 05/30/22 0116 05/30/22 0429 05/30/22 0819  BP: (!) 147/64 (!) 149/71 (!) 143/69 132/69  Pulse: 79 78 80 74  Resp: '16 18 18 16  '$ Temp: 98.6 F (37 C) 98.7 F (37.1 C) 98.6 F (37 C) (!) 97.3 F (36.3 C)  TempSrc: Oral Oral Oral Oral  SpO2: 100% 100% 93% 94%  Weight:      Height:       Exam Gen alert, no distress, pleasant AAF No rash, cyanosis or gangrene Sclera anicteric, throat clear  No jvd or bruits Chest clear bilat to bases, no rales/ wheezing RRR no MRG Abd soft ntnd no mass or ascites +bs GU defer Ext R BKA wrapped, LLE 1+ pretib edema Neuro is alert, Ox 3 , nf    LUA AVF +bruit     Home meds include - aspirin, renavite, plavix, cymbalta, zetia,  lasix 40 bid, insulin aspart/ glargine, metoprolol xl 50  hs, lyrica, zoloft, velphoro 1 gm ac tid, prns/ vits/ supps     OP HD: [facility] MWF 3h 32mn   84kg  400/500   LUA AVF  15ga  Heparin 1000 + 1000u/hr   Assessment/ Plan: SP R BKA - on 3/08 per orthopedics ESRD - on HD MWF w/ [facility]. Missed HD yest, plan HD today upstairs HTN/ volume - BP's are okay. Pt is 2.5-3kg under dry wt which is normal for a post-BKA patient. Small UF today 1 L max.  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 Follow, transfuse prn.  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, will add on alb/ phos DM2 on insulin      Rob Chinmay Squier  MD [facility] 05/30/2022, 10:51 AM  Recent Labs  Lab 05/29/22 0802 05/30/22 0432  HGB 11.6* 8.6*  CALCIUM  --  8.8*  CREATININE 8.00* 8.02*  K 4.0 4.9   Inpatient medications:  vitamin C  1,000 mg Oral Daily   aspirin  162 mg Oral Daily   calcitRIOL  0.25 mcg Oral BID WC   clopidogrel  75 mg Oral Q breakfast   docusate sodium  100 mg Oral Daily   DULoxetine  60 mg Oral Daily   ezetimibe  10 mg Oral Daily   furosemide  40 mg Oral BID   insulin aspart  0-9 Units Subcutaneous TID WC   insulin aspart  2 Units Subcutaneous TID WC   insulin glargine-yfgn  80 Units Subcutaneous Daily   ketotifen  1 drop Both Eyes BID   metoprolol succinate  50 mg Oral QPM   nutrition supplement (JUVEN)  1 packet Oral BID BM   pantoprazole  40 mg Oral Daily   pregabalin  50 mg Oral TID   sertraline  50 mg Oral Daily   sucroferric oxyhydroxide  1,000 mg Oral TID WC   sucroferric oxyhydroxide  500 mg Oral With snacks   zinc sulfate  220 mg Oral Daily    sodium chloride 75 mL/hr at 05/29/22 1554    ceFAZolin (ANCEF) IV     magnesium sulfate bolus IVPB     acetaminophen, alum & mag hydroxide-simeth, bisacodyl, guaiFENesin-dextromethorphan, hydrALAZINE, HYDROcodone-acetaminophen, labetalol, magnesium citrate, magnesium sulfate bolus IVPB, metoprolol tartrate, ondansetron, phenol, polyethylene glycol,  potassium chloride

## 2022-05-30 NOTE — Progress Notes (Signed)
Physical Therapy Treatment Patient Details Name: Kathryn Beck MRN: [id number] DOB: 1955-11-19 Today's Date: 05/30/2022   History of Present Illness 67 yo female with onset of gangrene R foot was admitted on 3/8 for R BKA.  Pt is referred to PT for mobility and safety education.  PMHx:  PVD, R foot ulcer, OA, anemia, stroke, HD with Lupper arm HD port, PNA, Covid 19, DM, hypoxia, HTN, HA, cardiac cath, back pain, depression    PT Comments    Pt greeted supine in bed and agreeable to session, however session limited by pt lethargy, tremors, and impaired cognition this date. Per RN pt with missed dialysis session yesterday and suspect this as cause. Pt needing increased assist versus session yesterday and max cues to complete bed mobility and laterally scoot along EOB toward HOB. Pt with tremors while sitting up EOB and needing constant hands assist on to maintain sitting balance, transfer attempts to standing deferred this session for pt safety.  Agree with current recommendation pending pt progress. Pt continues to benefit from skilled PT services to progress toward functional mobility goals.    Recommendations for follow up therapy are one component of a multi-disciplinary discharge planning process, led by the attending physician.  Recommendations may be updated based on patient status, additional functional criteria and insurance authorization.  Follow Up Recommendations  Acute inpatient rehab (3hours/day)     Assistance Recommended at Discharge Frequent or constant Supervision/Assistance  Patient can return home with the following A little help with walking and/or transfers;A little help with bathing/dressing/bathroom;Assistance with cooking/housework;Assist for transportation;Help with stairs or ramp for entrance   Equipment Recommendations  None recommended by PT    Recommendations for Other Services Rehab consult     Precautions / Restrictions Precautions Precautions:  Fall Precaution Comments: monitor O2 sats Required Braces or Orthoses: Other Brace Other Brace: limb protector Restrictions Weight Bearing Restrictions: Yes RLE Weight Bearing: Non weight bearing     Mobility  Bed Mobility Overal bed mobility: Needs Assistance Bed Mobility: Supine to Sit, Sit to Supine     Supine to sit: HOB elevated, Mod assist Sit to supine: Max assist   General bed mobility comments: hand over hand to reach for rail, mod assist to bring BLEs to and off EOB with use of bed pad to scoot out to edge    Transfers Overall transfer level: Needs assistance Equipment used: None Transfers: Bed to chair/wheelchair/BSC            Lateral/Scoot Transfers: Max assist General transfer comment: max A to scoot along EOB toward [facility]    Ambulation/Gait                   Stairs             Wheelchair Mobility    Modified Rankin (Stroke Patients Only)       Balance Overall balance assessment: Needs assistance Sitting-balance support: Bilateral upper extremity supported, Feet supported Sitting balance-Leahy Scale: Poor Sitting balance - Comments: pt needing constand hands on to maintain sitting balance this date                                    Cognition Arousal/Alertness: Awake/alert Behavior During Therapy: WFL for tasks assessed/performed Overall Cognitive Status: Within Functional Limits for tasks assessed  General Comments: pt with increased lethagry throughout session, missed dalysis yesterday, suspect this is cause, pt with difficulty following commands needing and needing increased time to initiate mobility and hand over hand cues        Exercises      General Comments General comments (skin integrity, edema, etc.): pt tremoring once sitting up on EOB and staring off into distance, husband present and stating staring is not new, RN aware of tremors      Pertinent  Vitals/Pain Pain Assessment Pain Assessment: Faces Faces Pain Scale: Hurts a little bit Pain Location: RLE Pain Descriptors / Indicators: Sore Pain Intervention(s): Monitored during session, Limited activity within patient's tolerance    Home Living                          Prior Function            PT Goals (current goals can now be found in the care plan section) Acute Rehab PT Goals PT Goal Formulation: With patient Time For Goal Achievement: 06/12/22 Progress towards PT goals: Not progressing toward goals - comment (lethargy)    Frequency    Min 3X/week      PT Plan      Co-evaluation              AM-PAC PT "6 Clicks" Mobility   Outcome Measure  Help needed turning from your back to your side while in a flat bed without using bedrails?: A Little Help needed moving from lying on your back to sitting on the side of a flat bed without using bedrails?: A Little Help needed moving to and from a bed to a chair (including a wheelchair)?: A Lot Help needed standing up from a chair using your arms (e.g., wheelchair or bedside chair)?: A Lot Help needed to walk in hospital room?: A Lot Help needed climbing 3-5 steps with a railing? : Total 6 Click Score: 13    End of Session   Activity Tolerance: Patient limited by lethargy Patient left: in bed;with call bell/phone within reach;with bed alarm set;with family/visitor present Nurse Communication: Mobility status PT Visit Diagnosis: Other abnormalities of gait and mobility (R26.89);Muscle weakness (generalized) (M62.81);Difficulty in walking, not elsewhere classified (R26.2);Pain Pain - Right/Left: Right Pain - part of body: Leg     Time: [id number] PT Time Calculation (min) (ACUTE ONLY): 19 min  Charges:  $Therapeutic Activity: 8-22 mins                     Cosmo Tetreault R. PTA Acute Rehabilitation Services Office: [place] 05/30/2022, 12:18 PM

## 2022-05-30 NOTE — Progress Notes (Signed)
   05/30/22 1754  Vitals  Temp 98.1 F (36.7 C)  BP (!) 154/65  MAP (mmHg) 79  BP Location Right Arm  BP Method Automatic  Patient Position (if appropriate) Lying  Pulse Rate 74  Pulse Rate Source Monitor  ECG Heart Rate 75  Resp (!) 22  MEWS COLOR  MEWS Score Color Green  Oxygen Therapy  SpO2 100 %  O2 Device Room Air  Pulse Oximetry Type Continuous  Height and Weight  Weight 84 kg  Type of Weight Post-Dialysis  BMI (Calculated) 31.77  MEWS Score  MEWS Temp 0  MEWS Systolic 0  MEWS Pulse 0  MEWS RR 1  MEWS LOC 0  MEWS Score 1   Received patient in bed to unit.  Alert and oriented.  Informed consent signed and in chart.   TX duration:  Patient tolerated well.  Transported back to the room  Alert, without acute distress.  Hand-off given to patient's nurse.   Access used: Yes Access issues: No  Total UF removed: 1500 Medication(s) given: Ancef 1g IV Post HD VS: See Above Post HD weight: 84 Kg   [facility]

## 2022-05-30 NOTE — Progress Notes (Signed)
OT Cancellation Note  Patient Details Name: Kathryn Beck MRN: [id number] DOB: [birth year]   Cancelled Treatment:    Reason Eval/Treat Not Completed: Patient at procedure or test/ unavailable (going to HD) Will follow up later date.   [name],H[place] 05/30/2022, 1:50 PM Maurie Boettcher, OT/L   Acute OT Clinical Specialist Acute Rehabilitation Services Pager [phone] Office [phone]

## 2022-05-30 NOTE — Plan of Care (Signed)
  Problem: Nutritional: Goal: Maintenance of adequate nutrition will improve Outcome: Progressing   Problem: Tissue Perfusion: Goal: Adequacy of tissue perfusion will improve Outcome: Progressing   Problem: Education: Goal: Knowledge of the prescribed therapeutic regimen will improve Outcome: Progressing   Problem: Activity: Goal: Ability to perform//tolerate increased activity and mobilize with assistive devices will improve Outcome: Progressing

## 2022-05-30 NOTE — Plan of Care (Signed)
  Problem: Coping: Goal: Ability to adjust to condition or change in health will improve Outcome: Progressing   Problem: Metabolic: Goal: Ability to maintain appropriate glucose levels will improve Outcome: Progressing   Problem: Nutritional: Goal: Maintenance of adequate nutrition will improve Outcome: Progressing Goal: Progress toward achieving an optimal weight will improve Outcome: Progressing   Problem: Skin Integrity: Goal: Risk for impaired skin integrity will decrease Outcome: Progressing   Problem: Education: Goal: Knowledge of the prescribed therapeutic regimen will improve Outcome: Progressing   Problem: Pain Management: Goal: Pain level will decrease with appropriate interventions Outcome: Progressing   Problem: Education: Goal: Knowledge of General Education information will improve Description: Including pain rating scale, medication(s)/side effects and non-pharmacologic comfort measures Outcome: Progressing   Problem: Activity: Goal: Risk for activity intolerance will decrease Outcome: Progressing   Problem: Pain Managment: Goal: General experience of comfort will improve Outcome: Progressing   Problem: Safety: Goal: Ability to remain free from injury will improve Outcome: Progressing   Problem: Skin Integrity: Goal: Risk for impaired skin integrity will decrease Outcome: Progressing

## 2022-05-30 NOTE — Progress Notes (Signed)
Patient ID: Kathryn Beck, female   DOB: 02/12/1956, 67 y.o.   MRN: [id number] Patient is postoperative day 1 right below-knee amputation.  She is alert and oriented and moves her extremities well.  Patient has staring episodes that her husband states that she has been doing for a while this is not an acute finding.  Her blood pressure and pulse are stable glucose is stable.  Will change her pain medication.

## 2022-05-31 LAB — GLUCOSE, CAPILLARY
Glucose-Capillary: 112 mg/dL — ABNORMAL HIGH (ref 70–99)
Glucose-Capillary: 44 mg/dL — CL (ref 70–99)
Glucose-Capillary: 103 mg/dL — ABNORMAL HIGH (ref 70–99)
Glucose-Capillary: 102 mg/dL — ABNORMAL HIGH (ref 70–99)
Glucose-Capillary: 89 mg/dL (ref 70–99)
Glucose-Capillary: 130 mg/dL — ABNORMAL HIGH (ref 70–99)

## 2022-05-31 LAB — HEPATITIS B SURFACE ANTIBODY, QUANTITATIVE: Hep B S AB Quant (Post): 941.2 m[IU]/mL (ref >9.9)

## 2022-05-31 LAB — CBC
HCT: 27.5 % — ABNORMAL LOW (ref 36.0–46.0)
Hemoglobin: 8.5 g/dL — ABNORMAL LOW (ref 12.0–15.0)
MCH: 25.4 pg — ABNORMAL LOW (ref 26.0–34.0)
MCHC: 30.9 g/dL (ref 30.0–36.0)
MCV: 82.3 fL (ref 80.0–100.0)
Platelets: 218 10*3/uL (ref 150–400)
RBC: 3.34 MIL/uL — ABNORMAL LOW (ref 3.87–5.11)
RDW: 18.7 % — ABNORMAL HIGH (ref 11.5–15.5)
WBC: 10.4 10*3/uL (ref 4.0–10.5)
nRBC: 0.6 % — ABNORMAL HIGH (ref 0.0–0.2)

## 2022-05-31 LAB — BASIC METABOLIC PANEL
Anion gap: 13 (ref 5–15)
BUN: 31 mg/dL — ABNORMAL HIGH (ref 8–23)
CO2: 26 mmol/L (ref 22–32)
Calcium: 8.4 mg/dL — ABNORMAL LOW (ref 8.9–10.3)
Chloride: 99 mmol/L (ref 98–111)
Creatinine, Ser: 4.95 mg/dL — ABNORMAL HIGH (ref 0.44–1.00)
GFR, Estimated: 9 mL/min — ABNORMAL LOW (ref >60)
Glucose, Bld: 43 mg/dL — CL (ref 70–99)
Potassium: 4.3 mmol/L (ref 3.5–5.1)
Sodium: 138 mmol/L (ref 135–145)

## 2022-05-31 NOTE — Progress Notes (Signed)
Lab called with blood glucose level of 43. Pt awake and alert and given 240 ml grape juice. Pt does not have IV access.  Pt also given graham crackers and applesauce.  Pt given 240 ml of OJ. Repeat CBG was 112.

## 2022-05-31 NOTE — Progress Notes (Signed)
Mobility Specialist Progress Note    05/31/22 1600  Mobility  Activity Stood at bedside  Level of Assistance Moderate assist, patient does 50-74%  Assistive Device Stedy  RLE Weight Bearing NWB  Activity Response Tolerated well  Mobility Referral Yes  $Mobility charge 1 Mobility   Pt received in bed and agreeable to attempt standing. C/o pain. Pt modA for first stand and minA for second and third stand when rocking back and forth before power-up. Returned to supine and left with call bell in reach. Pt c/o fatigue.  Hildred Alamin Mobility Specialist  Please Psychologist, sport and exercise or Rehab Office at [phone]

## 2022-05-31 NOTE — Progress Notes (Signed)
A consult was placed to IV Therapy for another new iv site;  pt has been seen/ assessed with ultrasound, and attempted by 2 IV Team RNs the last 2 days; she is limited to R arm only for labs and ivs;  unable to obtain a new iv ; pt is requesting "no more".  RN at bedside and is aware of no access.

## 2022-05-31 NOTE — Evaluation (Signed)
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  Pt is referred to PT for mobility and safety education.  PMHx:  PVD, R foot ulcer, OA, anemia, stroke, HD with Lupper arm HD port, PNA, Covid 19, DM, hypoxia, HTN, HA, cardiac cath, back pain, depression   Clinical Impression   PTA pt lives independently with her husband, using a RW/cane or wc for mobility depending on how she feels that day.  At baseline, Kathryn Beck is overall independent with self care and enjoys cooking. Currently requires mod A with lateral scoot transfer to the L and Max A with LB ADL tasks. Began education on desensitization techniques for R residual limb. Supportive family present for session. Kathryn Beck is very motivated to get stronger and do more for herself. Acute OT will follow to facilitate safe DC to [facility] for rehab.      Recommendations for follow up therapy are one component of a multi-disciplinary discharge planning process, led by the attending physician.  Recommendations may be updated based on patient status, additional functional criteria and insurance authorization.   Follow Up Recommendations  Acute inpatient rehab (3hours/day)     Assistance Recommended at Discharge Frequent or constant Supervision/Assistance  Patient can return home with the following Two people to help with walking and/or transfers;A lot of help with bathing/dressing/bathroom;Assistance with cooking/housework;Assist for transportation;Help with stairs or ramp for entrance    Functional Status Assessment  Patient has had a recent decline in their functional status and demonstrates the ability to make significant improvements in function in a reasonable and predictable amount of time.  Equipment Recommendations  BSC/3in1;Wheelchair (measurements OT);Wheelchair  cushion (measurements OT);Tub/shower bench (drop arm)    Recommendations for Other Services Rehab consult     Precautions / Restrictions Precautions Precautions: Fall Precaution Comments: wound vac Required Braces or Orthoses: Other Brace Other Brace: limb protector Restrictions Weight Bearing Restrictions: Yes RLE Weight Bearing: Non weight bearing      Mobility Bed Mobility Overal bed mobility: Needs Assistance Bed Mobility: Supine to Sit, Sit to Supine     Supine to sit: HOB elevated, Mod assist Sit to supine: Mod assist        Transfers Overall transfer level: Needs assistance Equipment used: None Transfers: Bed to chair/wheelchair/BSC            Lateral/Scoot Transfers: Mod assist        Balance Overall balance assessment: Needs assistance Sitting-balance support: Bilateral upper extremity supported, Feet supported Sitting balance-Leahy Scale: Fair                                     ADL either performed or assessed with clinical judgement   ADL Overall ADL's : Needs assistance/impaired Eating/Feeding: Modified independent   Grooming: Set up;Sitting   Upper Body Bathing: Set up;Sitting   Lower Body Bathing: Maximal assistance;Bed level   Upper Body Dressing : Set up;Supervision/safety   Lower Body Dressing: Maximal assistance;Bed level (rollings; began education on donning/doffing limb protector)       Toileting- Clothing Manipulation and Hygiene: Maximal assistance;Sitting/lateral lean       Functional mobility during ADLs: Moderate assistance (LATERAL SCOOT TOWARD l)       Vision Baseline Vision/History: 1 Wears glasses  Perception     Praxis      Pertinent Vitals/Pain Pain Assessment Pain Assessment: 0-10 Pain Score: 5  Pain Location: RLE Pain Descriptors / Indicators: Sore, Operative site guarding Pain Intervention(s): Limited activity within patient's tolerance, Patient requesting pain meds-RN  notified, RN gave pain meds during session, Repositioned     Hand Dominance Right   Extremity/Trunk Assessment Upper Extremity Assessment Upper Extremity Assessment: Overall WFL for tasks assessed (B hand numbness but functional)   Lower Extremity Assessment Lower Extremity Assessment: Defer to PT evaluation RLE Deficits / Details: new BK amputation; began education on importance of terminal knee extension and began education on desensitization techniques   Cervical / Trunk Assessment Cervical / Trunk Assessment:  (increased body habitus)   Communication Communication Communication: HOH   Cognition Arousal/Alertness: Awake/alert Behavior During Therapy: WFL for tasks assessed/performed Overall Cognitive Status: Impaired/Different from baseline Area of Impairment: Attention, Memory, Awareness, Problem solving                   Current Attention Level: Selective       Awareness: Emergent Problem Solving: Slow processing General Comments: slow processing; significantly improved performance with repetition; most likely affected by pain meds     General Comments       Exercises Exercises: General Lower Extremity, Other exercises General Exercises - Lower Extremity Straight Leg Raises: Right, 5 reps Other Exercises Other Exercises: TERMINAL KNEE EXTENSION Other Exercises: chair push ups x 5   Shoulder Instructions      Home Living Family/patient expects to be discharged to:: Inpatient rehab Living Arrangements: Spouse/significant other Available Help at Discharge: Family;Available 24 hours/day Type of Home: House Home Access: Stairs to enter [employer] of Steps: 2 Entrance Stairs-Rails: None Home Layout: Able to live on main level with bedroom/bathroom     Bathroom Shower/Tub: Teacher, early years/pre: Standard Bathroom Accessibility: Yes How Accessible: Accessible via wheelchair;Accessible via walker Home Equipment: Kiron  (2 wheels);Cane - single point;BSC/3in1;Wheelchair - manual;Grab bars - tub/shower      Lives With: Spouse    Prior Functioning/Environment Prior Level of Function : Independent/Modified Independent             Mobility Comments: up to walk with RW; used w/c or cane depending on how she was feeling ADLs Comments: I with ADL's; really enjoys cooking        OT Problem List: Decreased strength;Decreased range of motion;Decreased activity tolerance;Impaired balance (sitting and/or standing);Decreased cognition;Decreased safety awareness;Decreased knowledge of use of DME or AE;Decreased knowledge of precautions;Impaired sensation;Obesity;Pain      OT Treatment/Interventions: Self-care/ADL training;Therapeutic exercise;Energy conservation;DME and/or AE instruction;Therapeutic activities;Patient/family education;Balance training    OT Goals(Current goals can be found in the care plan section) Acute Rehab OT Goals Patient Stated Goal: to be more independent OT Goal Formulation: With patient Time For Goal Achievement: 06/14/22 Potential to Achieve Goals: Good  OT Frequency: Min 2X/week    Co-evaluation              AM-PAC OT "6 Clicks" Daily Activity     Outcome Measure Help from another person eating meals?: None Help from another person taking care of personal grooming?: A Little Help from another person toileting, which includes using toliet, bedpan, or urinal?: A Lot Help from another person bathing (including washing, rinsing, drying)?: A Lot Help from another person to put on and taking off regular upper body clothing?: A Little Help from another person to put on and taking off regular  lower body clothing?: A Lot 6 Click Score: 16   End of Session Equipment Utilized During Treatment: Gait belt Nurse Communication: Mobility status;Need for lift equipment Charlaine Dalton)  Activity Tolerance: Patient tolerated treatment well Patient left: in chair;with call bell/phone within  reach;with chair alarm set;with family/visitor present  OT Visit Diagnosis: Other abnormalities of gait and mobility (R26.89);Muscle weakness (generalized) (M62.81);Pain Pain - Right/Left: Right Pain - part of body: Leg                Time: 1725-1801 OT Time Calculation (min): 36 min Charges:  OT General Charges $OT Visit: 1 Visit OT Evaluation $OT Eval Moderate Complexity: 1 Mod OT Treatments $Self Care/Home Management : 8-22 mins  Maurie Boettcher, OT/L   Acute OT Clinical Specialist [place] Pager [phone] Office [phone]   [facility] 05/31/2022, 6:12 PM

## 2022-05-31 NOTE — Progress Notes (Signed)
Inpatient Rehab Admissions Coordinator:  Saw pt and family at bedside. Explained CIR goals and expectations. Discussed average length of stay, insurance authorization requirement, discharge home after completion of CIR. Pt and family acknowledged understanding. Pt interested in pursuing CIR. Pt and family confirmed that pt's husband along with other family members will be able to provide 24/7 support for pt after discharge. Will continue to follow.   Gayland Curry, [place], [place] Admissions Coordinator [phone]

## 2022-05-31 NOTE — Progress Notes (Addendum)
[facility] Progress Note   Subjective:    Seen and examined at bedside. Family members also at bedside. They expressed concerns of patient's flucuation of her mental status. S/p R BKA by Lauretta Chester. She reports tolerating the procedure well. Tolerated yesterday's HD with net UF 1.5L. Next HD 06/01/22.  Objective Vitals:   05/30/22 1841 05/30/22 2055 05/31/22 0531 05/31/22 0751  BP: 139/62 (!) 153/62 (!) 147/60 (!) 150/61  Pulse: 90 77 85 87  Resp:  '20 16 20  '$ Temp: [id number] F (36.6 C) 98.5 F (36.9 C) 98.3 F (36.8 C) 98.7 F (37.1 C)  TempSrc: Oral  Oral Oral  SpO2: 98% 97% 100% 100%  Weight:      Height:       Physical Exam General: Older female, forgetful at times, NAD, on RA Heart: S1 and S2; No murmurs, gallops, or rubs Lungs: Clear throughout; No wheezing, rales, or rhonchi Abdomen: Soft and non-tender Extremities: No LLE edema; R BKA Dialysis Access: AVF   [facility] Weights   05/29/22 0719 05/30/22 1359 05/30/22 1754  Weight: 81.6 kg 85 kg 84 kg    Intake/Output Summary (Last 24 hours) at 05/31/2022 1008 Last data filed at 05/31/2022 0705 Gross per 24 hour  Intake 1692 ml  Output 1500 ml  Net 192 ml    Additional Objective Labs: Basic Metabolic Panel: Recent Labs  Lab 05/29/22 0802 05/30/22 0432 05/31/22 0455  NA 145 141 138  K 4.0 4.9 4.3  CL 106 105 99  CO2  --  24 26  GLUCOSE 211* 101* 43*  BUN 41* 52* 31*  CREATININE 8.00* 8.02* 4.95*  CALCIUM  --  8.8* 8.4*   Liver Function Tests: No results for input(s): "AST", "ALT", "ALKPHOS", "BILITOT", "PROT", "ALBUMIN" in the last 168 hours. No results for input(s): "LIPASE", "AMYLASE" in the last 168 hours. CBC: Recent Labs  Lab 05/29/22 0802 05/30/22 0432 05/31/22 0455  WBC  --  12.5* 10.4  HGB 11.6* 8.6* 8.5*  HCT 34.0* 28.5* 27.5*  MCV  --  82.4 82.3  PLT  --  249 218   Blood Culture    Component Value Date/Time   SDES  11/07/2017 2340    URINE, CLEAN CATCH Performed at [facility], [address] [place], [place] [address]    [facility]  11/07/2017 2340    NONE Performed at [facility], [address], [place], [place] [address]    CULT (A) 11/07/2017 2340    <10,000 COLONIES/mL INSIGNIFICANT GROWTH Performed at [facility] [address] [place], [place] [address]    REPTSTATUS 11/09/2017 FINAL 11/07/2017 2340    Cardiac Enzymes: No results for input(s): "CKTOTAL", "CKMB", "CKMBINDEX", "TROPONINI" in the last 168 hours. CBG: Recent Labs  Lab 05/30/22 1839 05/30/22 2052 05/31/22 0701 05/31/22 0725 05/31/22 0750  GLUCAP 161* 89 44* 112* 103*   Iron Studies: No results for input(s): "IRON", "TIBC", "TRANSFERRIN", "FERRITIN" in the last 72 hours. Lab Results  Component Value Date   INR 1.0 03/03/2019   INR 1.17 09/02/2017   Studies/Results: No results found.  Medications:  sodium chloride Stopped (05/30/22 1900)    ceFAZolin (ANCEF) IV Stopped (05/30/22 1837)   magnesium sulfate bolus IVPB      vitamin C  1,000 mg Oral Daily   aspirin  162 mg Oral Daily   calcitRIOL  0.25 mcg Oral BID WC   Chlorhexidine Gluconate Cloth  6 each Topical Q0600   clopidogrel  75 mg Oral  Q breakfast   docusate sodium  100 mg Oral Daily   DULoxetine  60 mg Oral Daily   ezetimibe  10 mg Oral Daily   furosemide  40 mg Oral BID   insulin aspart  0-9 Units Subcutaneous TID WC   insulin aspart  2 Units Subcutaneous TID WC   insulin glargine-yfgn  80 Units Subcutaneous Daily   ketotifen  1 drop Both Eyes BID   metoprolol succinate  50 mg Oral QPM   nutrition supplement (JUVEN)  1 packet Oral BID BM   pantoprazole  40 mg Oral Daily   pregabalin  50 mg Oral TID   sertraline  50 mg Oral Daily   sucroferric oxyhydroxide  1,000 mg Oral TID WC   sucroferric oxyhydroxide  500 mg Oral With snacks   zinc sulfate  220 mg Oral Daily    Dialysis Orders: [facility] MWF 3h 23mn   84kg  400/500   LUA AVF  15ga  Heparin 1000 + 1000u/hr  Home meds  include - aspirin, renavite, plavix, cymbalta, zetia, lasix 40 bid, insulin aspart/ glargine, metoprolol xl 50 hs, lyrica, zoloft, velphoro 1 gm ac tid, prns/ vits/ supps   Assessment/Plan: SP R BKA - on 3/08 by Dr. DSharol Given per orthopedics ESRD - on HD MWF w/ [facility]. Missed HD 3/8, received HD yesterday. Next HD 06/01/22 per her usual schedule HTN/ volume - BP's are okay. Pt is 2.5-3kg under dry wt which is normal for a post-BKA patient. Small UF today 1 L max. Lower EDW at discharge. 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 Follow trend, transfuse prn. Needs to obtain records for ESA/Fe regimen. Will add iron studies for tomorrow. 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

## 2022-05-31 NOTE — Progress Notes (Signed)
Patient ID: Kathryn Beck, female   DOB: 1956/03/19, 67 y.o.   MRN: [id number] More alert. Pain controlled. Eating lunch. No complaints.   Post BKA mentally improved today. Hgb 8.5 , stable, was 8.6

## 2022-06-01 ENCOUNTER — Other Ambulatory Visit: Payer: Self-pay | Admitting: Family Medicine

## 2022-06-01 LAB — CBC WITH DIFFERENTIAL/PLATELET
Abs Immature Granulocytes: 0.04 10*3/uL (ref 0.00–0.07)
Basophils Absolute: 0 10*3/uL (ref 0.0–0.1)
Basophils Relative: 0 %
Eosinophils Absolute: 0.1 10*3/uL (ref 0.0–0.5)
Eosinophils Relative: 1 %
HCT: 27.4 % — ABNORMAL LOW (ref 36.0–46.0)
Hemoglobin: 8.1 g/dL — ABNORMAL LOW (ref 12.0–15.0)
Immature Granulocytes: 1 %
Lymphocytes Relative: 20 %
Lymphs Abs: 1.6 10*3/uL (ref 0.7–4.0)
MCH: 24.8 pg — ABNORMAL LOW (ref 26.0–34.0)
MCHC: 29.6 g/dL — ABNORMAL LOW (ref 30.0–36.0)
MCV: 83.8 fL (ref 80.0–100.0)
Monocytes Absolute: 1 10*3/uL (ref 0.1–1.0)
Monocytes Relative: 11 %
Neutro Abs: 5.6 10*3/uL (ref 1.7–7.7)
Neutrophils Relative %: 67 %
Platelets: 237 10*3/uL (ref 150–400)
RBC: 3.27 MIL/uL — ABNORMAL LOW (ref 3.87–5.11)
RDW: 18.7 % — ABNORMAL HIGH (ref 11.5–15.5)
WBC: 8.3 10*3/uL (ref 4.0–10.5)
nRBC: 0.7 % — ABNORMAL HIGH (ref 0.0–0.2)

## 2022-06-01 LAB — IRON AND TIBC
Iron: 28 ug/dL (ref 28–170)
Saturation Ratios: 19 % (ref 10.4–31.8)
TIBC: 151 ug/dL — ABNORMAL LOW (ref 250–450)
UIBC: 123 ug/dL

## 2022-06-01 LAB — GLUCOSE, CAPILLARY
Glucose-Capillary: 26 mg/dL — CL (ref 70–99)
Glucose-Capillary: 30 mg/dL — CL (ref 70–99)
Glucose-Capillary: 39 mg/dL — CL (ref 70–99)
Glucose-Capillary: 49 mg/dL — ABNORMAL LOW (ref 70–99)
Glucose-Capillary: 122 mg/dL — ABNORMAL HIGH (ref 70–99)
Glucose-Capillary: 126 mg/dL — ABNORMAL HIGH (ref 70–99)
Glucose-Capillary: 104 mg/dL — ABNORMAL HIGH (ref 70–99)

## 2022-06-01 LAB — RENAL FUNCTION PANEL
Albumin: 2.4 g/dL — ABNORMAL LOW (ref 3.5–5.0)
Anion gap: 14 (ref 5–15)
BUN: 50 mg/dL — ABNORMAL HIGH (ref 8–23)
CO2: 26 mmol/L (ref 22–32)
Calcium: 8.3 mg/dL — ABNORMAL LOW (ref 8.9–10.3)
Chloride: 98 mmol/L (ref 98–111)
Creatinine, Ser: 6.82 mg/dL — ABNORMAL HIGH (ref 0.44–1.00)
GFR, Estimated: 6 mL/min — ABNORMAL LOW (ref >60)
Glucose, Bld: 83 mg/dL (ref 70–99)
Phosphorus: 5.9 mg/dL — ABNORMAL HIGH (ref 2.5–4.6)
Potassium: 4.5 mmol/L (ref 3.5–5.1)
Sodium: 138 mmol/L (ref 135–145)

## 2022-06-01 LAB — FERRITIN: Ferritin: 1107 ng/mL — ABNORMAL HIGH (ref 11–307)

## 2022-06-01 MED ORDER — GLUCOSE 4 G PO CHEW
CHEWABLE_TABLET | ORAL | Status: AC
  Filled 2022-06-01: qty 1

## 2022-06-01 MED ORDER — INSULIN GLARGINE-YFGN 100 UNIT/ML ~~LOC~~ SOLN
64.0000 [IU] | Freq: Every day | SUBCUTANEOUS | Status: DC
  Administered 2022-06-02 – 2022-06-05 (×4): 64 [IU] via SUBCUTANEOUS
  Administered 2022-06-06: 32 [IU] via SUBCUTANEOUS
  Administered 2022-06-07 – 2022-06-10 (×4): 64 [IU] via SUBCUTANEOUS
  Filled 2022-06-01 (×10): qty 0.64

## 2022-06-01 MED ORDER — DARBEPOETIN ALFA 40 MCG/0.4ML IJ SOSY
40.0000 ug | PREFILLED_SYRINGE | INTRAMUSCULAR | Status: DC
  Administered 2022-06-01: 40 ug via SUBCUTANEOUS
  Filled 2022-06-01 (×2): qty 0.4

## 2022-06-01 MED ORDER — DEXTROSE 50 % IV SOLN
INTRAVENOUS | Status: AC
  Filled 2022-06-01: qty 50

## 2022-06-01 MED ORDER — SODIUM CHLORIDE 0.9 % IV SOLN
125.0000 mg | INTRAVENOUS | Status: AC
  Administered 2022-06-03 – 2022-06-05 (×2): 125 mg via INTRAVENOUS
  Filled 2022-06-01 (×4): qty 10

## 2022-06-01 NOTE — Progress Notes (Addendum)
[facility] Progress Note   Subjective:   Patient seen and examined at bedside in dialysis.  Tolerating treatment well so far.  Reports she plans to go to rehab on discharge.  Denies pain, palpitations, CP, SOB, n/v/d, dizziness and fatigue.    Objective Vitals:   06/01/22 0930 06/01/22 1000 06/01/22 1030 06/01/22 1100  BP: (!) 134/57 (!) 127/55 (!) 123/56 (!) 131/56  Pulse: 71 71 72 71  Resp: '13 11 11 15  '$ Temp:      TempSrc:      SpO2: 98% 97% 98% 98%  Weight:      Height:       Physical Exam General:WDWN female in NAD Heart:RRR, no mrg Lungs:CTAB, nml WOB on RA Abdomen:soft, NTND Extremities:no LE edema, L BKA w/wound vac in place Dialysis Access: LU AVF in use   Filed Weights   05/30/22 1359 05/30/22 1754 06/01/22 0804  Weight: 85 kg 84 kg 85.4 kg   No intake or output data in the 24 hours ending 06/01/22 1107  Additional Objective Labs: Basic Metabolic Panel: Recent Labs  Lab 05/30/22 0432 05/31/22 0455 06/01/22 0333  NA 141 138 138  K 4.9 4.3 4.5  CL 105 99 98  CO2 '24 26 26  '$ GLUCOSE 101* 43* 83  BUN 52* 31* 50*  CREATININE 8.02* 4.95* 6.82*  CALCIUM 8.8* 8.4* 8.3*  PHOS  --   --  5.9*   Liver Function Tests: Recent Labs  Lab 06/01/22 0333  ALBUMIN 2.4*   CBC: Recent Labs  Lab 05/30/22 0432 05/31/22 0455 06/01/22 0333  WBC 12.5* 10.4 8.3  NEUTROABS  --   --  5.6  HGB 8.6* 8.5* 8.1*  HCT 28.5* 27.5* 27.4*  MCV 82.4 82.3 83.8  PLT 249 218 237   CBG: Recent Labs  Lab 05/31/22 0725 05/31/22 0750 05/31/22 1114 05/31/22 1609 05/31/22 2235  GLUCAP 112* 103* 102* 89 130*   Iron Studies:  Recent Labs    06/01/22 0333  IRON 28  TIBC 151*  FERRITIN 1,107*   Lab Results  Component Value Date   INR 1.0 03/03/2019   INR 1.17 09/02/2017   Studies/Results: No results found.  Medications:  sodium chloride Stopped (05/30/22 1900)   magnesium sulfate bolus IVPB      vitamin C  1,000 mg Oral Daily   aspirin  162 mg  Oral Daily   calcitRIOL  0.25 mcg Oral BID WC   Chlorhexidine Gluconate Cloth  6 each Topical Q0600   clopidogrel  75 mg Oral Q breakfast   docusate sodium  100 mg Oral Daily   DULoxetine  60 mg Oral Daily   ezetimibe  10 mg Oral Daily   furosemide  40 mg Oral BID   insulin aspart  0-9 Units Subcutaneous TID WC   insulin aspart  2 Units Subcutaneous TID WC   insulin glargine-yfgn  80 Units Subcutaneous Daily   ketotifen  1 drop Both Eyes BID   metoprolol succinate  50 mg Oral QPM   nutrition supplement (JUVEN)  1 packet Oral BID BM   pantoprazole  40 mg Oral Daily   pregabalin  50 mg Oral TID   sertraline  50 mg Oral Daily   sucroferric oxyhydroxide  1,000 mg Oral TID WC   sucroferric oxyhydroxide  500 mg Oral With snacks   zinc sulfate  220 mg Oral Daily    Dialysis Orders: [facility] MWF 3h 75mn   84kg  400/500   LUA AVF  15ga  Heparin 1000 + 1000u/hr   Home meds include - aspirin, renavite, plavix, cymbalta, zetia, lasix 40 bid, insulin aspart/ glargine, metoprolol xl 50 hs, lyrica, zoloft, velphoro 1 gm ac tid, prns/ vits/ supps    Assessment/Plan: SP R BKA - on 3/08 by Dr. Sharol Given, per orthopedics ESRD - on HD MWF w/ [facility]. HD today per regular schedule.  HTN/ volume - BP's are okay. Pt is 2.5-3kg under dry wt which is normal for a post-BKA patient. UF goal today 1L. Lower EDW at discharge. Anemia esrd - Hb from 11 to now 8.1 post surgery.  Follow trend, transfuse prn. tsat 19% - order iron course. Start ESA.   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  Phosphorus mildly elevated.  Continue binders. DM2 on insulin Nutrition - renal diet w/fluid restrictions.  Dispo - may go to inpatient rehab  Jen Mow, PA-C [facility] 06/01/2022,11:07 AM  LOS: 3 days    Seen and examined independently.  Agree with note and exam as documented above by physician extender and as noted here.  See also my procedure note from today.  Discussed with spouse at  bedside.  They are looking at inpatient rehab.    Given ESRD would consider a transition off of cymbalta per primary team   Claudia Desanctis, MD 06/01/2022  1:35 PM

## 2022-06-01 NOTE — Progress Notes (Signed)
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  Pt is referred to PT for mobility and safety education.  PMHx:  PVD, R foot ulcer, OA, anemia, stroke, HD with Lupper arm HD port, PNA, Covid 19, DM, hypoxia, HTN, HA, cardiac cath, back pain, depression    PT Comments    Pt is slowly progressing with goals. Pt demonstrates improved bed mobility this session from last session despite fatigue. Pt was unable to clear the EOB this session due to fatigue and weakness from HD and low sugar. Pt was able to assist  scooting up EOB at Max A barely clearing her buttocks from EOB with EOB elevated. Pt will benefit from continued skilled physical therapy services in a setting with a greater level of care and physical therapy frequency on discharge from acute care hospital setting due to pt current level of function, PLOF, home set up and available assistance in order to decrease risk for immobility, falls, injury and re-hospitalization.    Recommendations for follow up therapy are one component of a multi-disciplinary discharge planning process, led by the attending physician.  Recommendations may be updated based on patient status, additional functional criteria and insurance authorization.  Follow Up Recommendations  Acute inpatient rehab (3hours/day)     Assistance Recommended at Discharge Frequent or constant Supervision/Assistance  Patient can return home with the following A little help with walking and/or transfers;Assistance with cooking/housework;Assist for transportation;Help with stairs or ramp for entrance   Equipment Recommendations  None recommended by PT    Recommendations for Other Services       Precautions / Restrictions Precautions Precautions: Fall Precaution Comments: wound vac Required Braces or  Orthoses: Other Brace Other Brace: limb protector Restrictions Weight Bearing Restrictions: Yes RLE Weight Bearing: Non weight bearing     Mobility  Bed Mobility Overal bed mobility: Needs Assistance Bed Mobility: Supine to Sit, Sit to Supine     Supine to sit: HOB elevated, Min guard Sit to supine: Min assist   General bed mobility comments: Pt significantly improved ability to get to EOB. Continues to require Min A to get bil LE into the bed for sitting to supine. Patient Response: Cooperative  Transfers Overall transfer level: Needs assistance                 General transfer comment: Pt attempted to stand at EOB with RW and was unable due to fatigue from dialysis and low sugar earlier after dialysis. Attempted 3x with pt getting closer and closer to EOB even with elevated EOB pt unable to clear. Pt scooted up EOB at Max A to the R to assist with positioning in the bed.    Ambulation/Gait               General Gait Details: unable at this time due to current functional status.         Balance Overall balance assessment: Needs assistance Sitting-balance support: Bilateral upper extremity supported, Feet supported, Single extremity supported Sitting balance-Leahy Scale: Fair Sitting balance - Comments: Pt sitting balance is improving with unilteral/bilateral UE support to maintain sitting balance at EOB.        Cognition Arousal/Alertness: Lethargic Behavior During Therapy: WFL for tasks assessed/performed Overall Cognitive Status: Within Functional Limits for tasks assessed      Exercises  General Comments General comments (skin integrity, edema, etc.): Checked on pt 2x prior to this visit. Earlier pt was in HD, second attempt after lunch pt sugar was 26; returned with pt sugar increased but pt continued to be very fatigued from earlier. Agreed to attempt mobility.      Pertinent Vitals/Pain Pain Assessment Pain Assessment: Faces Faces Pain  Scale: Hurts a little bit Pain Location: RLE Pain Descriptors / Indicators: Sore, Operative site guarding Pain Intervention(s): Monitored during session     PT Goals (current goals can now be found in the care plan section) Acute Rehab PT Goals Patient Stated Goal: to get to walk again PT Goal Formulation: With patient Time For Goal Achievement: 06/12/22 Potential to Achieve Goals: Good Progress towards PT goals: Progressing toward goals    Frequency    Min 3X/week      PT Plan Current plan remains appropriate       AM-PAC PT "6 Clicks" Mobility   Outcome Measure  Help needed turning from your back to your side while in a flat bed without using bedrails?: A Little Help needed moving from lying on your back to sitting on the side of a flat bed without using bedrails?: A Little Help needed moving to and from a bed to a chair (including a wheelchair)?: Total Help needed standing up from a chair using your arms (e.g., wheelchair or bedside chair)?: Total Help needed to walk in hospital room?: A Lot Help needed climbing 3-5 steps with a railing? : Total 6 Click Score: 11    End of Session   Activity Tolerance: Patient limited by lethargy Patient left: in bed;with call bell/phone within reach;with bed alarm set Nurse Communication: Mobility status;Other (comment) (wound vac lost suction) PT Visit Diagnosis: Other abnormalities of gait and mobility (R26.89);Muscle weakness (generalized) (M62.81);Difficulty in walking, not elsewhere classified (R26.2);Pain Pain - Right/Left: Right Pain - part of body: Leg     Time: [id number] PT Time Calculation (min) (ACUTE ONLY): 12 min  Charges:  $Therapeutic Activity: 8-22 mins                     Tomma Rakers, DPT, CLT  Acute Rehabilitation Services Office: [phone] (Secure chat preferred)    Ander Purpura 06/01/2022, 4:15 PM

## 2022-06-01 NOTE — Progress Notes (Signed)
   06/01/22 1225  Vitals  Temp 98.1 F (36.7 C)  Pulse Rate 73  Resp 13  BP (!) 131/56  SpO2 100 %  O2 Device Room Air  Type of Weight Post-Dialysis  Oxygen Therapy  Pulse Oximetry Type Continuous  Oximetry Probe Site Changed No  Post Treatment  Dialyzer Clearance Lightly streaked  Duration of HD Treatment -hour(s) 3.5 hour(s)  Hemodialysis Intake (mL) 100 mL  Liters Processed 68.1  Fluid Removed (mL) 600 mL  Tolerated HD Treatment Yes  AVG/AVF Arterial Site Held (minutes) 10 minutes  AVG/AVF Venous Site Held (minutes) 10 minutes   Received patient in bed to unit.  Alert and oriented.  Informed consent signed and in chart.   [place] duration:3.5  Patient tolerated well.  Transported back to the room  Alert, without acute distress.  Hand-off given to patient's nurse.   Access used: LU A Access Access issues: no complications  Total UF removed: 600 Medication(s) given: none   [facility]

## 2022-06-01 NOTE — Progress Notes (Signed)
Patient ID: Kathryn Beck, female   DOB: [birth year], 67 y.o.   MRN: [id number] Patient is a 67 year old woman status post right transtibial amputation.  No drainage in the wound VAC canister.  Patient undergoing hemodialysis Tuesday Thursday Saturday.  Awaiting authorization for inpatient rehab.

## 2022-06-01 NOTE — Progress Notes (Signed)
Inpatient Rehab Admissions Coordinator:  Spoke with pt's daughter Edwena Felty on the telephone. Discussed CIR goals and expectations. She acknowledged understanding. She is supportive of pt pursuing CIR.  Insurance authorization started. Will continue to follow.   Gayland Curry, [place], [place] Admissions Coordinator [phone]

## 2022-06-01 NOTE — Procedures (Signed)
Seen and examined on dialysis.  Procedure supervised.  Blood pressure 121/58 and HR 71.  LUE AVF in use. Tolerating goal.    Claudia Desanctis, MD 06/01/2022  9:01 AM

## 2022-06-02 ENCOUNTER — Encounter (HOSPITAL_COMMUNITY): Payer: Self-pay | Admitting: Hematology

## 2022-06-02 ENCOUNTER — Ambulatory Visit (HOSPITAL_BASED_OUTPATIENT_CLINIC_OR_DEPARTMENT_OTHER): Payer: 59 | Admitting: Family

## 2022-06-02 LAB — GLUCOSE, CAPILLARY
Glucose-Capillary: 97 mg/dL (ref 70–99)
Glucose-Capillary: 161 mg/dL — ABNORMAL HIGH (ref 70–99)
Glucose-Capillary: 159 mg/dL — ABNORMAL HIGH (ref 70–99)
Glucose-Capillary: 220 mg/dL — ABNORMAL HIGH (ref 70–99)

## 2022-06-02 LAB — SURGICAL PATHOLOGY

## 2022-06-02 MED ORDER — CHLORHEXIDINE GLUCONATE CLOTH 2 % EX PADS
6.0000 | MEDICATED_PAD | Freq: Every day | CUTANEOUS | Status: DC
  Administered 2022-06-02 – 2022-06-09 (×6): 6 via TOPICAL

## 2022-06-02 NOTE — Care Management Important Message (Signed)
Important Message  Patient Details  Name: Kathryn Beck MRN: [id number] Date of Birth: 1956/02/24   Medicare Important Message Given:  Yes     Hannah Beat 06/02/2022, 4:24 PM

## 2022-06-02 NOTE — Progress Notes (Signed)
Patient ID: Kathryn Beck, female   DOB: 22-Mar-1956, 67 y.o.   MRN: [id number] Patient is awaiting insurance approval for inpatient rehab.  She is sitting up at bedside eating breakfast without complaints.

## 2022-06-02 NOTE — Progress Notes (Signed)
Mobility Specialist Progress Note   06/02/22 1545  Mobility  Activity Transferred from chair to bed  Level of Assistance +2 (takes two people) (Mod A)  Assistive Device [employer] of Motion/Exercises Active  RLE Weight Bearing NWB  Activity Response Tolerated well   Patient received in recliner requesting assistance back to bed. Stood onto [employer] device with mod A +2 and cues for hand placement. Tolerated without complaint or incident. Was left in supine with all needs met, call bell in reach.   [place] Adelle Zachar, BS EXP Mobility Specialist Please contact via SecureChat or Rehab office at [phone]

## 2022-06-02 NOTE — Progress Notes (Addendum)
[facility] Progress Note   Subjective:   Patient seen and examined at bedside.  Reports blood sugar drop post dialysis.  Did not eat anything until after HD yesterday.  Will order to have at least snack for patient before/during dialysis.  Today, feeling well.  Waiting to hear from insurance about CIR. Denies CP, SOB, abdominal pain and n/v/d.   Objective Vitals:   06/01/22 1721 06/01/22 2054 06/02/22 0505 06/02/22 0807  BP: (!) 148/52 (!) 124/54 (!) 144/60 (!) 133/58  Pulse: 75 75 72 72  Resp:  '20 18 17  '$ Temp:  97.8 F (36.6 C) 98 F (36.7 C) 98.4 F (36.9 C)  TempSrc:  Oral Oral Oral  SpO2:  97% 100% 98%  Weight:      Height:       Physical Exam General:well appearing female in NAD Heart:RRR Lungs:CTAB, nml WOB on RA Abdomen:soft, NTND Extremities:R BKA w/wound vac in place, no edema b/l Dialysis Access: LU AVF +b/t   Filed Weights   05/30/22 1359 05/30/22 1754 06/01/22 0804  Weight: 85 kg 84 kg 85.4 kg    Intake/Output Summary (Last 24 hours) at 06/02/2022 0844 Last data filed at 06/01/2022 1225 Gross per 24 hour  Intake --  Output 600 ml  Net -600 ml    Additional Objective Labs: Basic Metabolic Panel: Recent Labs  Lab 05/30/22 0432 05/31/22 0455 06/01/22 0333  NA 141 138 138  K 4.9 4.3 4.5  CL 105 99 98  CO2 '24 26 26  '$ GLUCOSE 101* 43* 83  BUN 52* 31* 50*  CREATININE 8.02* 4.95* 6.82*  CALCIUM 8.8* 8.4* 8.3*  PHOS  --   --  5.9*   Liver Function Tests: Recent Labs  Lab 06/01/22 0333  ALBUMIN 2.4*    CBC: Recent Labs  Lab 05/30/22 0432 05/31/22 0455 06/01/22 0333  WBC 12.5* 10.4 8.3  NEUTROABS  --   --  5.6  HGB 8.6* 8.5* 8.1*  HCT 28.5* 27.5* 27.4*  MCV 82.4 82.3 83.8  PLT 249 218 237   CBG: Recent Labs  Lab 06/01/22 1418 06/01/22 1453 06/01/22 1611 06/01/22 2051 06/02/22 0752  GLUCAP 49* 122* 126* 104* 97   Iron Studies:  Recent Labs    06/01/22 0333  IRON 28  TIBC 151*  FERRITIN 1,107*   Lab Results   Component Value Date   INR 1.0 03/03/2019   INR 1.17 09/02/2017   Studies/Results: No results found.  Medications:  sodium chloride Stopped (05/30/22 1900)   ferric gluconate (FERRLECIT) IVPB     magnesium sulfate bolus IVPB      vitamin C  1,000 mg Oral Daily   aspirin  162 mg Oral Daily   calcitRIOL  0.25 mcg Oral BID WC   Chlorhexidine Gluconate Cloth  6 each Topical Q0600   clopidogrel  75 mg Oral Q breakfast   darbepoetin (ARANESP) injection - DIALYSIS  40 mcg Subcutaneous Q Mon-1800   docusate sodium  100 mg Oral Daily   DULoxetine  60 mg Oral Daily   ezetimibe  10 mg Oral Daily   furosemide  40 mg Oral BID   insulin aspart  0-9 Units Subcutaneous TID WC   insulin glargine-yfgn  64 Units Subcutaneous Daily   ketotifen  1 drop Both Eyes BID   metoprolol succinate  50 mg Oral QPM   nutrition supplement (JUVEN)  1 packet Oral BID BM   pantoprazole  40 mg Oral Daily   pregabalin  50 mg Oral  TID   sertraline  50 mg Oral Daily   sucroferric oxyhydroxide  1,000 mg Oral TID WC   sucroferric oxyhydroxide  500 mg Oral With snacks   zinc sulfate  220 mg Oral Daily    Dialysis Orders: [facility] MWF 3h 31mn   84kg  400/500   LUA AVF  15ga  Heparin 1000 + 1000u/hr   Home meds include - aspirin, renavite, plavix, cymbalta, zetia, lasix 40 bid, insulin aspart/ glargine, metoprolol xl 50 hs, lyrica, zoloft, velphoro 1 gm ac tid, prns/ vits/ supps    Assessment/Plan: SP R BKA - on 3/08 by Dr. DSharol Given  Plans for CIR pending insurance approval.  ESRD - on HD MWF w/ [facility] [place]. HD tomorrow per regular schedule.  HTN/ volume - BP's are okay. On Toprol XL '50mg'$  qd and furosemide '40mg'$  qd. Does not appear overloaded, UF as tolerated.  Anemia esrd - Hb from 11 to now 8.1 post surgery.  Follow trend, transfuse prn. tsat 19% - order iron course. Start ESA - 454m qMon.. 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  Phosphorus mildly elevated.  Continue binders. DM2 on insulin.  DM management  per primary team  Nutrition - renal diet w/fluid restrictions.  Di[place]aklandPA-C [facility] 06/02/2022,8:44 AM  LOS: 4 days   Seen and examined independently.  Agree with note and exam as documented above by physician extender and as noted here.  General adult female in bed in no acute distress.  She is lying near flat comfortably for a bath HEENT normocephalic atraumatic extraocular movements intact sclera anicteric Neck supple trachea midline Lungs clear to auscultation bilaterally normal work of breathing at rest on room air  Heart S1S2 no rub Abdomen soft nontender nondistended Extremities no edema lower extremities; right BKA Psych normal mood and affect Neuro awake and conversant follows commands and provides hx  Access LUE AVF bruit and thrill   S/p right BKA with ortho awaiting CIR ESRD - HD per MWF schedule  DM type 2 - per primary team. Note pharmacy assisting with insulin  HTN - controlled  Anemia CKD - on ESA   Dispo - awaiting CIR   LoClaudia DesanctisMD 06/02/2022  11:29 AM

## 2022-06-02 NOTE — Progress Notes (Signed)
Physical Therapy Treatment Patient Details Name: Kathryn Beck MRN: [id number] DOB: September 10, 1955 Today's Date: 06/02/2022   History of Present Illness 67 yo female with onset of gangrene R foot was admitted on 3/8 for R BKA.  Pt is referred to PT for mobility and safety education.  PMHx:  PVD, R foot ulcer, OA, anemia, stroke, HD with Lupper arm HD port, PNA, Covid 19, DM, hypoxia, HTN, HA, cardiac cath, back pain, depression    PT Comments    Patient progressing well towards PT goals. Session focused on standing trials using stedy and there ex. Requires Mod-Max A of 2 to stand from different surface heights. Attempted to stand using RW however pt's left knee buckled and needed total A to reposition back on bed. Worked on standing trials, mini squats and finding midline in standing. Used stedy to transfer pt to chair. Instructed pt in there ex of residual limb. Family present and supportive. Continues to be motivated and good AIR candidate. Will follow.    Recommendations for follow up therapy are one component of a multi-disciplinary discharge planning process, led by the attending physician.  Recommendations may be updated based on patient status, additional functional criteria and insurance authorization.  Follow Up Recommendations  Acute inpatient rehab (3hours/day)     Assistance Recommended at Discharge Frequent or constant Supervision/Assistance  Patient can return home with the following Assistance with cooking/housework;Assist for transportation;Help with stairs or ramp for entrance;A lot of help with walking and/or transfers   Equipment Recommendations  None recommended by PT    Recommendations for Other Services       Precautions / Restrictions Precautions Precautions: Fall;Other (comment) Precaution Comments: wound vac Required Braces or Orthoses: Other Brace Other Brace: limb protector Restrictions Weight Bearing Restrictions: Yes RLE Weight Bearing: Non weight bearing      Mobility  Bed Mobility Overal bed mobility: Needs Assistance Bed Mobility: Supine to Sit     Supine to sit: HOB elevated, Min guard     General bed mobility comments: Min guard for safety. increased time and use of rail for support. no dizziness.    Transfers Overall transfer level: Needs assistance Equipment used: Rolling walker (2 wheels), Ambulation equipment used Transfers: Sit to/from Stand, Bed to chair/wheelchair/BSC Sit to Stand: Max assist, +2 physical assistance, From elevated surface, Mod assist           General transfer comment: Initially max A of 2 to power to standing from EOB with left knee buckling and pt sliding forward off bed needing total A of 2 to reposition back on bed when using RW, Mod-max A of 2 to stand from bed using stedy x3, transferred to chair via stedy and able to stand from low recliner x1 with Max A of 2. Transfer via Lift Equipment: Stedy  Ambulation/Gait               General Gait Details: unable at this time   Marine scientist [place] (Stroke Patients Only)       Balance Overall balance assessment: Needs assistance Sitting-balance support: Feet supported, No upper extremity supported Sitting balance-Leahy Scale: Fair     Standing balance support: During functional activity, Bilateral upper extremity supported Standing balance-Leahy Scale: Poor Standing balance comment: Able to stand in stedy with left lateral lean, able to self correct to midline. Worked on static standing with cues for hip/knee extension. Able to perform  mini squats on LLE in stedy x10, left knee became more unstable with more standing trials.                            Cognition Arousal/Alertness: Awake/alert Behavior During Therapy: WFL for tasks assessed/performed Overall Cognitive Status: Within Functional Limits for tasks assessed                                           Exercises Amputee Exercises Quad Sets: AROM, Left, 5 reps, Supine    General Comments General comments (skin integrity, edema, etc.): Family present during session.      Pertinent Vitals/Pain Pain Assessment Pain Assessment: No/denies pain    Home Living                          Prior Function            PT Goals (current goals can now be found in the care plan section) Progress towards PT goals: Progressing toward goals    Frequency    Min 3X/week      PT Plan Current plan remains appropriate    Co-evaluation              AM-PAC PT "6 Clicks" Mobility   Outcome Measure  Help needed turning from your back to your side while in a flat bed without using bedrails?: A Little Help needed moving from lying on your back to sitting on the side of a flat bed without using bedrails?: A Little Help needed moving to and from a bed to a chair (including a wheelchair)?: Total Help needed standing up from a chair using your arms (e.g., wheelchair or bedside chair)?: Total Help needed to walk in hospital room?: Total Help needed climbing 3-5 steps with a railing? : Total 6 Click Score: 10    End of Session Equipment Utilized During Treatment: Gait belt Activity Tolerance: Patient tolerated treatment well Patient left: in chair;with call bell/phone within reach;with family/visitor present Nurse Communication: Mobility status;Need for lift equipment (stedy) PT Visit Diagnosis: Other abnormalities of gait and mobility (R26.89);Muscle weakness (generalized) (M62.81);Difficulty in walking, not elsewhere classified (R26.2)     Time: [id number] PT Time Calculation (min) (ACUTE ONLY): 23 min  Charges:  $Therapeutic Activity: 23-37 mins                     Marisa Severin, PT, DPT Acute Rehabilitation Services Secure chat preferred Office [phone]      Marguarite Arbour A Jean Alejos 06/02/2022, 1:30 PM

## 2022-06-02 NOTE — Consult Note (Addendum)
   [facility] Inpatient Consult   06/02/2022  ZAEDA MCFERRAN [birth year] [id number]  [place]  Accountable Care Organization [ACO] Patient: Kathryn Beck PPO  Primary Care Provider:  Fayrene Helper, MD  Patient is currently active with [facility] for chronic disease management services.  Patient has been engaged by a [place].  Our community based plan of care has focused on disease management and community resource support.   Collaboration with active [facility] LCSW of patient's admission and potential current post acute hospital transition to CIR.   Plan: Currently patient is being recommended for an inpatient rehabilitation transition post acute hospital. Continue to follow for disposition and needs.  Of note, [facility] Care Management services does not replace or interfere with any services that are needed or arranged by inpatient [facility] care management team.   For additional questions or referrals please contact:  Natividad Brood, RN BSN [place]  [phone] business mobile phone Toll free office [phone]  *[place]  [phone] Fax number: [phone] [email or web address]

## 2022-06-02 NOTE — Progress Notes (Signed)
Pt receives out-pt HD at [facility] on MWF with 6:30 am chair time. Will assist as needed.   Melven Sartorius Renal Navigator [phone]

## 2022-06-02 NOTE — Progress Notes (Signed)
  Inpatient Rehabilitation Admissions Coordinator   I await insurance determination with [facility] Medicare for a possible CIR admit. I met at bedside with patient and her daughter, Edwena Felty, and reviewed estimated cost of care if [facility] approves CIR. Patient also provided me with secondary [facility] commercial information which I will verify active and provide to hospital billing a secondary.  Danne Baxter, RN, MSN Rehab Admissions Coordinator [phone] 06/02/2022 11:22 AM

## 2022-06-03 LAB — RENAL FUNCTION PANEL
Albumin: 2.4 g/dL — ABNORMAL LOW (ref 3.5–5.0)
Anion gap: 13 (ref 5–15)
BUN: 58 mg/dL — ABNORMAL HIGH (ref 8–23)
CO2: 26 mmol/L (ref 22–32)
Calcium: 8.6 mg/dL — ABNORMAL LOW (ref 8.9–10.3)
Chloride: 100 mmol/L (ref 98–111)
Creatinine, Ser: 6.3 mg/dL — ABNORMAL HIGH (ref 0.44–1.00)
GFR, Estimated: 7 mL/min — ABNORMAL LOW (ref >60)
Glucose, Bld: 260 mg/dL — ABNORMAL HIGH (ref 70–99)
Phosphorus: 6.2 mg/dL — ABNORMAL HIGH (ref 2.5–4.6)
Potassium: 5.3 mmol/L — ABNORMAL HIGH (ref 3.5–5.1)
Sodium: 139 mmol/L (ref 135–145)

## 2022-06-03 LAB — CBC
HCT: 26.1 % — ABNORMAL LOW (ref 36.0–46.0)
Hemoglobin: 8.2 g/dL — ABNORMAL LOW (ref 12.0–15.0)
MCH: 25.9 pg — ABNORMAL LOW (ref 26.0–34.0)
MCHC: 31.4 g/dL (ref 30.0–36.0)
MCV: 82.3 fL (ref 80.0–100.0)
Platelets: 267 10*3/uL (ref 150–400)
RBC: 3.17 MIL/uL — ABNORMAL LOW (ref 3.87–5.11)
RDW: 18.3 % — ABNORMAL HIGH (ref 11.5–15.5)
WBC: 7.8 10*3/uL (ref 4.0–10.5)
nRBC: 0.4 % — ABNORMAL HIGH (ref 0.0–0.2)

## 2022-06-03 LAB — GLUCOSE, CAPILLARY
Glucose-Capillary: 209 mg/dL — ABNORMAL HIGH (ref 70–99)
Glucose-Capillary: 144 mg/dL — ABNORMAL HIGH (ref 70–99)
Glucose-Capillary: 255 mg/dL — ABNORMAL HIGH (ref 70–99)
Glucose-Capillary: 155 mg/dL — ABNORMAL HIGH (ref 70–99)

## 2022-06-03 NOTE — TOC Initial Note (Signed)
Transition of Care [facility]) - Initial/Assessment Note    Patient Details  Name: Kathryn Beck MRN: [id number] Date of Birth: 1955/11/22  [facility]) CM/SW Contact:    Joanne Chars, LCSW Phone Number: 06/03/2022, 4:08 PM  Clinical Narrative:    CSW informed that CIR [facility] was denied, pt interested in SNF at [facility].  CSW spoke with pt and granddaughter in room.  Pt agreeable to referral for SNF, still hoping CIR will be approved on appeal.  Pt lives with husband, no current services.  Pt unclear on covid vaccines, granddaughter does think pt has been vaccinated.    Referral sent out for SNF, CSW reached out to [facility] center to review.                 Expected Discharge Plan: IP Rehab Facility Barriers to Discharge: Continued Medical Work up, SNF Pending bed offer   Patient Goals and CMS Choice Patient states their goals for this hospitalization and ongoing recovery are:: move around   Choice offered to / list presented to : Patient      Expected Discharge Plan and Services In-house Referral: Clinical Social Work   Post Acute Care Choice: IP Rehab Living arrangements for the past 2 months: Single Family Home                                      Prior Living Arrangements/Services Living arrangements for the past 2 months: Single Family Home Lives with:: Spouse Patient language and need for interpreter reviewed:: Yes Do you feel safe going back to the place where you live?: Yes      Need for Family Participation in Patient Care: Yes (Comment) Care giver support system in place?: Yes (comment)   Criminal Activity/Legal Involvement Pertinent to Current Situation/Hospitalization: No - Comment as needed  Activities of Daily Living Home Assistive Devices/Equipment: Shower chair with back, CBG Meter, Grab bars around toilet, Eyeglasses, Blood pressure cuff ADL Screening (condition at time of admission) Patient's cognitive ability adequate to  safely complete daily activities?: Yes Is the patient deaf or have difficulty hearing?: No Does the patient have difficulty seeing, even when wearing glasses/contacts?: No Does the patient have difficulty concentrating, remembering, or making decisions?: Yes Patient able to express need for assistance with ADLs?: Yes Does the patient have difficulty dressing or bathing?: Yes Independently performs ADLs?: Yes (appropriate for developmental age) Does the patient have difficulty walking or climbing stairs?: Yes Weakness of Legs: Both Weakness of Arms/Hands: Both  Permission Sought/Granted Permission sought to share information with : Family Supports Permission granted to share information with : Yes, Verbal Permission Granted  Share Information with NAME: all listed contacts  Permission granted to share info w AGENCY: SNF        Emotional Assessment Appearance:: Appears stated age Attitude/Demeanor/Rapport: Engaged Affect (typically observed): Appropriate, Pleasant Orientation: : Oriented to Self, Oriented to Place, Oriented to  Time, Oriented to Situation Alcohol / Substance Use: Not Applicable Psych Involvement: No (comment)  Admission diagnosis:  S/P BKA (below knee amputation), right ([place]) [Z89.511] Patient Active Problem List   Diagnosis Date Noted   S/P BKA (below knee amputation), right ([place]) 05/29/2022   Confusion 05/19/2022   Visual hallucinations 05/19/2022   Watery eyes 05/19/2022   Requires assistance with activities of daily living (ADL) 04/26/2022   Diabetic wet gangrene of the foot ([place]) 04/25/2022   Gangrene  of right foot ([place]) 04/25/2022   PAD (peripheral artery disease) ([place]) 04/25/2022   Other osteoporosis without current pathological fracture 11/10/2021   Hypocalcemia 11/10/2021   Fall 11/02/2021   Left foot pain 10/31/2021   Low back pain with left-sided sciatica 10/31/2021   Left ear impacted cerumen 09/18/2021   Chronic left shoulder pain 06/10/2021    Abnormal CXR 03/10/2021   Ear pain, bilateral 03/10/2021   Morbid obesity ([place]) 03/10/2021   Subungual hematoma of second toe of left foot 10/28/2020   Insomnia 10/28/2020   Memory loss or impairment 09/26/2020   Forgetfulness 09/26/2020   Recurrent vertigo 09/26/2020   Abnormal MRI, cervical spine 03/04/2020   Right leg weakness 02/28/2020   Knee pain, right 10/23/2019   Cough 08/09/2019   Carpal tunnel syndrome 06/13/2019   Chronic pain syndrome 06/13/2019   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[place]) 06/13/2019   Neck pain 03/23/2019   Near syncope 12/05/2018   ESRD on hemodialysis ([place]) 11/22/2018   Ischemic heart disease 09/25/2018   Not currently working due to disabled status 06/13/2018   ESRD on dialysis ([place]) 06/12/2018   HTN (hypertension) 06/12/2018   Depression 03/09/2018   Adrenal mass, left ([place]) 11/09/2017   MGUS (monoclonal gammopathy of unknown significance) 11/05/2017   Shoulder pain 11/01/2017   Chronic diastolic (congestive) heart failure ([place]) 09/20/2017   Bilateral leg weakness 09/09/2017   Adrenal adenoma 09/03/2017   Uncontrolled type 2 diabetes mellitus 08/18/2017   Unsteady gait 07/11/2017   Recurrent falls 07/11/2017   Anemia of chronic disease 03/24/2017   Personal history of colonic polyps 02/10/2017   Dysphagia 11/05/2016   Irritable bowel syndrome 02/04/2016   Hospital discharge follow-up 10/27/2015   Intolerant of heat 10/07/2014   Cardiac murmur 01/17/2014   Back pain with left-sided radiculopathy 09/18/2013   Seasonal allergies 03/10/2013   Lipoma of back 12/22/2012   Other seasonal allergic rhinitis 08/22/2012   OSA (obstructive sleep apnea) 06/02/2011   Chronic kidney disease, stage 4 (severe) ([place]) 01/13/2011   Neuropathy 04/16/2010   Malaise and fatigue 07/24/2009   Personal history of other diseases of the nervous system and sense organs 05/08/2009   LUPUS ERYTHEMATOSUS, DISCOID 06/05/2008   Arm pain 05/30/2008    Hyperlipidemia 06/07/2007   Obesity 06/07/2007   Malignant hypertension 06/07/2007   Type 2 diabetes mellitus with hyperglycemia ([place]) 06/07/2007   Diabetes mellitus ([place]) 06/07/2007   PCP:  Fayrene Helper, MD Pharmacy:   [place], [place] - [address] [place] [address] Phone: [phone] Fax: [phone]  [place], [place] [address] Phone: [phone] Fax: [phone]  [id number] [place] [id number]: [phone]: [phone]    Social Determinants of Health (SDOH) Social History: SDOH Screenings   Food Insecurity: No Food Insecurity (05/30/2022)  Housing: Low Risk  (05/30/2022)  Transportation Needs: No Transportation Needs (05/30/2022)  Utilities: Not At Risk (05/30/2022)  Alcohol Screen: Low Risk  (05/12/2022)  Depression (PHQ2-9): High Risk (05/12/2022)  Financial Resource Strain: Medium Risk (05/12/2022)  Physical Activity: Inactive (05/12/2022)  Social Connections: Socially Integrated (05/12/2022)  Stress: No Stress Concern Present (05/12/2022)  Tobacco Use: Low Risk  (05/30/2022)   SDOH Interventions:     Readmission Risk Interventions    05/01/2022    3:54 PM  Readmission Risk Prevention Plan  Transportation Screening Complete  HRI or Home Care Consult Complete  Palliative Care Screening Not Applicable  Medication Review (RN Care Manager) Complete

## 2022-06-03 NOTE — Progress Notes (Signed)
Occupational Therapy Treatment Patient Details Name: Kathryn Beck MRN: [id number] DOB: 24-Jul-1955 Today's Date: 06/03/2022   History of present illness 67 yo female with onset of gangrene R foot was admitted on 3/8 for R BKA.  Pt is referred to PT for mobility and safety education.  PMHx:  PVD, R foot ulcer, OA, anemia, stroke, HD with Lupper arm HD port, PNA, Covid 19, DM, hypoxia, HTN, HA, cardiac cath, back pain, depression   OT comments  Pt progressing toward established OT goals. Very motivated to participate in therapy session. Pt donning sock EOB with min guard A. Pt with max questions about limb protector so providing education on recommendations of when to wear. Pt performing STS transfers up to [facility] with min A. Pt educated regarding desensitization techniques. Continue to highly recommend AIR for continued multidisciplinary rehabilitative services.   Recommendations for follow up therapy are one component of a multi-disciplinary discharge planning process, led by the attending physician.  Recommendations may be updated based on patient status, additional functional criteria and insurance authorization.    Follow Up Recommendations  Acute inpatient rehab (3hours/day)     Assistance Recommended at Discharge Frequent or constant Supervision/Assistance  Patient can return home with the following  Two people to help with walking and/or transfers;A lot of help with bathing/dressing/bathroom;Assistance with cooking/housework;Assist for transportation;Help with stairs or ramp for entrance   Equipment Recommendations  BSC/3in1;Wheelchair (measurements OT);Wheelchair cushion (measurements OT);Tub/shower bench (drop arm)    Recommendations for Other Services      Precautions / Restrictions Precautions Precautions: Fall;Other (comment) Precaution Comments: wound vac Required Braces or Orthoses: Other Brace Other Brace: limb protector Restrictions Weight Bearing Restrictions:  Yes RLE Weight Bearing: Non weight bearing       Mobility Bed Mobility Overal bed mobility: Needs Assistance Bed Mobility: Supine to Sit     Supine to sit: HOB elevated, Min guard     General bed mobility comments: fpr safety    Transfers Overall transfer level: Needs assistance Equipment used: Rolling walker (2 wheels), Ambulation equipment used Transfers: Sit to/from Stand, Bed to chair/wheelchair/BSC Sit to Stand: Min assist, +2 physical assistance, +2 safety/equipment           General transfer comment: Min A +2 to power up and then min guard A once standing Transfer via Lift Equipment: Stedy   Balance Overall balance assessment: Needs assistance Sitting-balance support: Feet supported, No upper extremity supported Sitting balance-Leahy Scale: Fair Sitting balance - Comments: supervision sitting EOB, able to achieve figure 4 position over thigh   Standing balance support: During functional activity, Bilateral upper extremity supported Standing balance-Leahy Scale: Poor Standing balance comment: Able to stand in stedy with left lateral lean, able to self correct to midline. Worked on static standing with cues for hip/knee extension. Able to perform mini squats on LLE in stedy x10, left knee became more unstable with more standing trials.                           ADL either performed or assessed with clinical judgement   ADL Overall ADL's : Needs assistance/impaired                     Lower Body Dressing: Min guard;Sitting/lateral leans Lower Body Dressing Details (indicate cue type and reason): donning sock EOB Toilet Transfer: Minimal assistance;+2 for physical assistance;+2 for safety/equipment (stedy)   Toileting- Clothing Manipulation and Hygiene: Maximal assistance;Sit to/from stand Toileting -  Clothing Manipulation Details (indicate cue type and reason): Pt reliant on UE once in standing     Functional mobility during ADLs: Minimal  assistance;+2 for physical assistance General ADL Comments: Pt educated regarding desensitiation.    Extremity/Trunk Assessment Upper Extremity Assessment Upper Extremity Assessment: Overall WFL for tasks assessed   Lower Extremity Assessment Lower Extremity Assessment: Defer to PT evaluation        Vision       Perception     Praxis      Cognition Arousal/Alertness: Awake/alert Behavior During Therapy: WFL for tasks assessed/performed Overall Cognitive Status: Impaired/Different from baseline Area of Impairment: Problem solving                             Problem Solving: Slow processing General Comments: slow processing and incr time to respond throughout. Pt with decr knowledge of intended use of limb protector        Exercises      Shoulder Instructions       General Comments grand daughter present during session    Pertinent Vitals/ Pain       Pain Assessment Pain Assessment: Faces Faces Pain Scale: Hurts a little bit Pain Location: RLE Pain Descriptors / Indicators: Sore, Operative site guarding Pain Intervention(s): Limited activity within patient's tolerance, Monitored during session  Home Living                                          Prior Functioning/Environment              Frequency  Min 2X/week        Progress Toward Goals  OT Goals(current goals can now be found in the care plan section)  Progress towards OT goals: Progressing toward goals  Acute Rehab OT Goals Patient Stated Goal: go to rehab OT Goal Formulation: With patient/family Time For Goal Achievement: 06/14/22 Potential to Achieve Goals: Good ADL Goals Pt Will Perform Lower Body Bathing: with min assist;sitting/lateral leans Pt Will Perform Lower Body Dressing: with min assist;sitting/lateral leans Pt Will Transfer to Toilet: bedside commode;with +2 assist;with min assist Additional ADL Goal #1: Pt will demonstrate desensitization  techniques for RE  Plan Discharge plan remains appropriate;Frequency remains appropriate    Co-evaluation                 AM-PAC OT "6 Clicks" Daily Activity     Outcome Measure   Help from another person eating meals?: None Help from another person taking care of personal grooming?: A Little Help from another person toileting, which includes using toliet, bedpan, or urinal?: A Lot Help from another person bathing (including washing, rinsing, drying)?: A Lot Help from another person to put on and taking off regular upper body clothing?: A Little Help from another person to put on and taking off regular lower body clothing?: A Lot 6 Click Score: 16    End of Session Equipment Utilized During Treatment: Gait belt;Other (comment) (stedy)  OT Visit Diagnosis: Other abnormalities of gait and mobility (R26.89);Muscle weakness (generalized) (M62.81);Pain Pain - Right/Left: Right Pain - part of body: Leg   Activity Tolerance Patient tolerated treatment well   Patient Left in chair;with call bell/phone within reach;with chair alarm set;with family/visitor present   Nurse Communication Mobility status;Need for lift equipment (stedy)        Time:  [id number] OT Time Calculation (min): 25 min  Charges: OT General Charges $OT Visit: 1 Visit OT Treatments $Self Care/Home Management : 23-37 mins  Elder Cyphers, OTR/L [facility] Acute Rehabilitation Office: [phone]   Magnus Ivan 06/03/2022, 5:17 PM

## 2022-06-03 NOTE — Progress Notes (Signed)
Patient ID: Kathryn Beck, female   DOB: 1956/01/19, 67 y.o.   MRN: [id number] Patient comfortable this morning without complaints.  Wound VAC is functioning well.  Awaiting authorization for discharge to inpatient rehab.  Patient in dialysis this morning.

## 2022-06-03 NOTE — Progress Notes (Addendum)
Kathryn Beck Progress Note   Subjective:   Patient seen and examined at bedside in dialysis. Treatment just started, no issues with cannulation.  Waiting to hear from insurance about CIR.  Denies CP, SOB, abdominal pain and n/v/d.  Reports some pain last night in stump but feeling better yesterday.    Objective Vitals:   06/02/22 1940 06/03/22 0525 06/03/22 0805 06/03/22 0816  BP: (!) 149/58 (!) 144/61 (!) 136/57 (!) 134/56  Pulse: 93 77 74 71  Resp: '17 16 13 15  '$ Temp: 98.7 F (37.1 C) 98.8 F (37.1 C) 98 F (36.7 C)   TempSrc: Oral Oral Oral   SpO2: 98% 96% 93% 95%  Weight:   85.8 kg   Height:       Physical Exam General:well appearing female in NAD Heart:RRR, no mrg Lungs:CTAB, nml WOB on RA Abdomen:soft, NTND Extremities:no LE edema, R BKA Dialysis Access: LU AVF in use   Filed Weights   05/30/22 1754 06/01/22 0804 06/03/22 0805  Weight: 84 kg 85.4 kg 85.8 kg    Intake/Output Summary (Last 24 hours) at 06/03/2022 0829 Last data filed at 06/02/2022 1600 Gross per 24 hour  Intake 350 ml  Output --  Net 350 ml    Additional Objective Labs: Basic Metabolic Panel: Recent Labs  Lab 05/31/22 0455 06/01/22 0333 06/03/22 0143  NA 138 138 139  K 4.3 4.5 5.3*  CL 99 98 100  CO2 '26 26 26  '$ GLUCOSE 43* 83 260*  BUN 31* 50* 58*  CREATININE 4.95* 6.82* 6.30*  CALCIUM 8.4* 8.3* 8.6*  PHOS  --  5.9* 6.2*   Liver Function Tests: Recent Labs  Lab 06/01/22 0333 06/03/22 0143  ALBUMIN 2.4* 2.4*   CBC: Recent Labs  Lab 05/30/22 0432 05/31/22 0455 06/01/22 0333 06/03/22 0143  WBC 12.5* 10.4 8.3 7.8  NEUTROABS  --   --  5.6  --   HGB 8.6* 8.5* 8.1* 8.2*  HCT 28.5* 27.5* 27.4* 26.1*  MCV 82.4 82.3 83.8 82.3  PLT 249 218 237 267   CBG: Recent Labs  Lab 06/02/22 0752 06/02/22 1107 06/02/22 1618 06/02/22 1941 06/03/22 0647  GLUCAP 97 161* 159* 220* 209*   Iron Studies:  Recent Labs    06/01/22 0333  IRON 28  TIBC 151*  FERRITIN  1,107*   Lab Results  Component Value Date   INR 1.0 03/03/2019   INR 1.17 09/02/2017   Studies/Results: No results found.  Medications:  sodium chloride Stopped (05/30/22 1900)   ferric gluconate (FERRLECIT) IVPB     magnesium sulfate bolus IVPB      vitamin C  1,000 mg Oral Daily   aspirin  162 mg Oral Daily   calcitRIOL  0.25 mcg Oral BID WC   Chlorhexidine Gluconate Cloth  6 each Topical Q0600   Chlorhexidine Gluconate Cloth  6 each Topical Q0600   clopidogrel  75 mg Oral Q breakfast   darbepoetin (ARANESP) injection - DIALYSIS  40 mcg Subcutaneous Q Mon-1800   docusate sodium  100 mg Oral Daily   DULoxetine  60 mg Oral Daily   ezetimibe  10 mg Oral Daily   furosemide  40 mg Oral BID   insulin aspart  0-9 Units Subcutaneous TID WC   insulin glargine-yfgn  64 Units Subcutaneous Daily   ketotifen  1 drop Both Eyes BID   metoprolol succinate  50 mg Oral QPM   nutrition supplement (JUVEN)  1 packet Oral BID BM   pantoprazole  40 mg Oral Daily   pregabalin  50 mg Oral TID   sertraline  50 mg Oral Daily   sucroferric oxyhydroxide  1,000 mg Oral TID WC   sucroferric oxyhydroxide  500 mg Oral With snacks   zinc sulfate  220 mg Oral Daily    Dialysis Orders: [facility] MWF 3h 12mn   84kg  400/500   LUA AVF  15ga  Heparin 1000 + 1000u/hr   Home meds include - aspirin, renavite, plavix, cymbalta, zetia, lasix 40 bid, insulin aspart/ glargine, metoprolol xl 50 hs, lyrica, zoloft, velphoro 1 gm ac tid, prns/ vits/ supps    Assessment/Plan: SP R BKA - on 3/08 by Dr. DSharol Given  Plans for CIR pending insurance approval.  ESRD - on HD MWF w/ [facility]. HD today per regular schedule.  HTN/ volume - BP's are okay. On Toprol XL '50mg'$  qd and furosemide '40mg'$  qd. Does not appear overloaded, UF as tolerated.  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  Follow trend, transfuse prn. tsat 19% - order iron course. Started ESA - 457m qMon.. 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   Phosphorus mildly elevated.  Continue binders, if remains elevated will need to increase dose. DM2 on insulin.  DM management per primary team  Nutrition - renal diet w/fluid restrictions.  LiJen MowPA-C [facility] 06/03/2022,8:29 AM  LOS: 5 days   Seen and examined independently.  Agree with note and exam as documented above by physician extender and as noted here.  Seen and examined on dialysis.  Procedure supervised.  Blood pressure 143/59 and HR 71.  Tolerating goal.  LUE AVF in use.   Changed to renal diet   LoClaudia DesanctisMD 06/03/2022  9:20 AM

## 2022-06-03 NOTE — NC FL2 (Signed)
MEDICAID FL2 LEVEL OF CARE FORM     IDENTIFICATION  Patient Name: Kathryn Beck Birthdate: [birth year] Sex: female Admission Date (Current Location): 05/29/2022  [facility] and [place] Number:  Herbalist and Address:  [facility], [place] [address], [place],  [address]      Provider Number: [id number]  Attending Physician Name and Address:  [name], MD  Relative Name and Phone Number:  weatherford,lois Sister   [phone]    Current Level of Care: Hospital Recommended Level of Care: [place] Prior Approval Number:    Date Approved/Denied:   PASRR Number: [id number]  Discharge Plan: SNF    Current Diagnoses: Patient Active Problem List   Diagnosis Date Noted   S/P BKA (below knee amputation), right ([place]) 05/29/2022   Confusion 05/19/2022   Visual hallucinations 05/19/2022   Watery eyes 05/19/2022   Requires assistance with activities of daily living (ADL) 04/26/2022   Diabetic wet gangrene of the foot ([place]) 04/25/2022   Gangrene of right foot ([place]) 04/25/2022   PAD (peripheral artery disease) ([place]) 04/25/2022   Other osteoporosis without current pathological fracture 11/10/2021   Hypocalcemia 11/10/2021   Fall 11/02/2021   Left foot pain 10/31/2021   Low back pain with left-sided sciatica 10/31/2021   Left ear impacted cerumen 09/18/2021   Chronic left shoulder pain 06/10/2021   Abnormal CXR 03/10/2021   Ear pain, bilateral 03/10/2021   Morbid obesity ([place]) 03/10/2021   Subungual hematoma of second toe of left foot 10/28/2020   Insomnia 10/28/2020   Memory loss or impairment 09/26/2020   Forgetfulness 09/26/2020   Recurrent vertigo 09/26/2020   Abnormal MRI, cervical spine 03/04/2020   Right leg weakness 02/28/2020   Knee pain, right 10/23/2019   Cough 08/09/2019   Carpal tunnel syndrome 06/13/2019   Chronic pain syndrome 06/13/2019   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 ([place]) 06/13/2019   Neck pain 03/23/2019   Near syncope 12/05/2018   ESRD on hemodialysis ([place]) 11/22/2018   Ischemic heart disease 09/25/2018   Not currently working due to disabled status 06/13/2018   ESRD on dialysis ([place]) 06/12/2018   HTN (hypertension) 06/12/2018   Depression 03/09/2018   Adrenal mass, left ([place]) 11/09/2017   MGUS (monoclonal gammopathy of unknown significance) 11/05/2017   Shoulder pain 11/01/2017   Chronic diastolic (congestive) heart failure ([place]) 09/20/2017   Bilateral leg weakness 09/09/2017   Adrenal adenoma 09/03/2017   Uncontrolled type 2 diabetes mellitus 08/18/2017   Unsteady gait 07/11/2017   Recurrent falls 07/11/2017   Anemia of chronic disease 03/24/2017   Personal history of colonic polyps 02/10/2017   Dysphagia 11/05/2016   Irritable bowel syndrome 02/04/2016   Hospital discharge follow-up 10/27/2015   Intolerant of heat 10/07/2014   Cardiac murmur 01/17/2014   Back pain with left-sided radiculopathy 09/18/2013   Seasonal allergies 03/10/2013   Lipoma of back 12/22/2012   Other seasonal allergic rhinitis 08/22/2012   OSA (obstructive sleep apnea) 06/02/2011   Chronic kidney disease, stage 4 (severe) ([place]) 01/13/2011   Neuropathy 04/16/2010   Malaise and fatigue 07/24/2009   Personal history of other diseases of the nervous system and sense organs 05/08/2009   LUPUS ERYTHEMATOSUS, DISCOID 06/05/2008   Arm pain 05/30/2008   Hyperlipidemia 06/07/2007   Obesity 06/07/2007   Malignant hypertension 06/07/2007   Type 2 diabetes mellitus with hyperglycemia ([place]) 06/07/2007   Diabetes mellitus ([place]) 06/07/2007    Orientation RESPIRATION BLADDER Height &  Weight     Self, Time, Situation, Place  Normal Continent Weight: 181 lb 10.5 oz (82.4 kg) Height:  '5\' 4"'$  (162.6 cm)  BEHAVIORAL SYMPTOMS/MOOD NEUROLOGICAL BOWEL NUTRITION STATUS      Incontinent Diet (see discharge summary)  AMBULATORY STATUS COMMUNICATION OF NEEDS Skin   Total  Care Verbally Surgical wounds                       Personal Care Assistance Level of Assistance  Bathing, Feeding, Dressing Bathing Assistance: Maximum assistance Feeding assistance: Limited assistance Dressing Assistance: Maximum assistance     Functional Limitations Info  Sight, Hearing, Speech Sight Info: Adequate Hearing Info: Adequate Speech Info: Adequate    SPECIAL CARE FACTORS FREQUENCY  PT (By licensed PT), OT (By licensed OT)     PT Frequency: 5x week OT Frequency: 5x week            Contractures Contractures Info: Not present    Additional Factors Info  Code Status, Allergies Code Status Info: full Allergies Info: Ace Inhibitors, Penicillins, Statins, Albuterol           Current Medications (06/03/2022):  This is the current hospital active medication list Current Facility-Administered Medications  Medication Dose Route Frequency Provider Last Rate Last Admin   acetaminophen (TYLENOL) tablet 325-650 mg  325-650 mg Oral Q6H PRN [name], MD       ascorbic acid (VITAMIN C) tablet 1,000 mg  1,000 mg Oral Daily [name], MD   1,000 mg at 06/03/22 1408   aspirin chewable tablet 162 mg  162 mg Oral Daily [name], MD   162 mg at 06/03/22 1408   bisacodyl (DULCOLAX) EC tablet 5 mg  5 mg Oral Daily PRN [name], MD       calcitRIOL (ROCALTROL) capsule 0.25 mcg  0.25 mcg Oral BID WC [name], MD   0.25 mcg at 06/03/22 0754   Chlorhexidine Gluconate Cloth 2 % PADS 6 each  6 each Topical Q0600 Roney Jaffe, MD   6 each at 06/02/22 0941   Chlorhexidine Gluconate Cloth 2 % PADS 6 each  6 each Topical Q0600 Penninger, Ria Comment, [place]   6 each at 06/03/22 0326   clopidogrel (PLAVIX) tablet 75 mg  75 mg Oral Q breakfast [name], MD   75 mg at 06/03/22 0754   Darbepoetin Alfa (ARANESP) injection 40 mcg  40 mcg Subcutaneous Q Mon-1800 Penninger, Lindsay, [place]   40 mcg at 06/01/22 2215   docusate sodium (COLACE) capsule 100 mg  100 mg  Oral Daily [name], MD   100 mg at 06/03/22 1408   DULoxetine (CYMBALTA) DR capsule 60 mg  60 mg Oral Daily [name], MD   60 mg at 06/03/22 1408   ezetimibe (ZETIA) tablet 10 mg  10 mg Oral Daily [name], MD   10 mg at 06/03/22 1408   ferric gluconate (FERRLECIT) 125 mg in sodium chloride 0.9 % 100 mL IVPB  125 mg Intravenous Q M,W,F-HD Penninger, Lindsay, PA   Stopped at 06/03/22 1351   furosemide (LASIX) tablet 40 mg  40 mg Oral BID [name], MD   40 mg at 06/03/22 0754   guaiFENesin-dextromethorphan (ROBITUSSIN DM) 100-10 MG/5ML syrup 15 mL  15 mL Oral Q4H PRN [name], MD       hydrALAZINE (APRESOLINE) injection 5 mg  5 mg Intravenous Q20 Min PRN [name],  MD       HYDROcodone-acetaminophen (NORCO/VICODIN) 5-325 MG per tablet 1-2 tablet  1-2 tablet Oral Q6H PRN [name], MD   1 tablet at 05/31/22 2151   insulin aspart (novoLOG) injection 0-9 Units  0-9 Units Subcutaneous TID WC [name], MD   3 Units at 06/03/22 0728   insulin glargine-yfgn (SEMGLEE) injection 64 Units  64 Units Subcutaneous Daily Reome, Earle J, RPH   64 Units at 06/03/22 1351   ketotifen (ZADITOR) 0.035 % ophthalmic solution 1 drop  1 drop Both Eyes BID [name], MD   1 drop at 06/03/22 1354   labetalol (NORMODYNE) injection 10 mg  10 mg Intravenous Q10 min PRN [name], MD       magnesium sulfate IVPB 2 g 50 mL  2 g Intravenous Daily PRN [name], MD       metoprolol succinate (TOPROL-XL) 24 hr tablet 50 mg  50 mg Oral QPM [name], MD   50 mg at 06/02/22 1850   metoprolol tartrate (LOPRESSOR) injection 2-5 mg  2-5 mg Intravenous Q2H PRN [name], MD       nutrition supplement (JUVEN) (JUVEN) powder packet 1 packet  1 packet Oral BID BM [name], MD   1 packet at 06/03/22 1409   ondansetron (ZOFRAN) injection 4 mg  4 mg Intravenous Q6H PRN [name], MD       pantoprazole (PROTONIX) EC tablet 40 mg  40 mg Oral Daily [name], MD    40 mg at 06/03/22 1408   phenol (CHLORASEPTIC) mouth spray 1 spray  1 spray Mouth/Throat PRN [name], MD       polyethylene glycol (MIRALAX / GLYCOLAX) packet 17 g  17 g Oral Daily PRN [name], MD       pregabalin (LYRICA) capsule 50 mg  50 mg Oral TID [name], MD   50 mg at 06/03/22 1413   sertraline (ZOLOFT) tablet 50 mg  50 mg Oral Daily [name], MD   50 mg at 06/03/22 1408   sucroferric oxyhydroxide ([name]) chewable tablet 1,000 mg  1,000 mg Oral TID WC [name], MD   1,000 mg at 06/03/22 1408   sucroferric oxyhydroxide ([name]) chewable tablet 500 mg  500 mg Oral With snacks [name], MD   500 mg at 06/02/22 2215   zinc sulfate capsule 220 mg  220 mg Oral Daily [name], MD   220 mg at 06/03/22 1408   Facility-Administered Medications Ordered in Other Encounters  Medication Dose Route Frequency Provider Last Rate Last Admin   0.9 %  sodium chloride infusion   Intravenous Continuous Lockamy, Randi L, NP-C 20 mL/hr at 05/29/22 0928 New Bag at 05/29/22 1013     Discharge Medications: Please see discharge summary for a list of discharge medications.  Relevant Imaging Results:  Relevant Lab Results:   Additional Information SS# [id number], out-pt HD at [facility] on MWF with 6:30 am chair time.  Joanne Chars, LCSW

## 2022-06-03 NOTE — Progress Notes (Signed)
  Inpatient Rehabilitation Admissions Coordinator   Insurance has denied CIR admit. I contacted pt's daughter, Loretha Stapler and met with patient . We are to begin appeal today per their request. In the meantime, daughter would also like to look at [facility]. Acute team and TOC made aware.  Danne Baxter, RN, MSN Rehab Admissions Coordinator [phone] 06/03/2022 3:19 PM

## 2022-06-03 NOTE — Progress Notes (Signed)
   06/03/22 1306  Vitals  Temp 97.7 F (36.5 C)  Temp Source Oral  BP (!) 134/56  MAP (mmHg) 80  BP Location Right Arm  BP Method Automatic  Patient Position (if appropriate) Lying  Pulse Rate 68  Pulse Rate Source Monitor  ECG Heart Rate 68  Resp 12  MEWS COLOR  MEWS Score Color Green  Oxygen Therapy  SpO2 98 %  O2 Device Room Air  Patient Activity (if Appropriate) In bed  Height and Weight  Weight 82.4 kg  Type of Weight Post-Dialysis  BMI (Calculated) 31.17  MEWS Score  MEWS Temp 0  MEWS Systolic 0  MEWS Pulse 0  MEWS RR 1  MEWS LOC 0  MEWS Score 1   Received patient in bed to unit.  Alert and oriented.  Informed consent signed and in chart.   [place] duration:3:30  Patient tolerated well.  Transported back to the room  Alert, without acute distress.  Hand-off given to patient's nurse.   Access used: Yes Access issues: No  Total UF removed: 3000 Medication(s) given: Iron Ferric IV Post HD VS: See Above Post HD weight: 82.4 kg   [facility]

## 2022-06-03 NOTE — Progress Notes (Signed)
OT Cancellation Note  Patient Details Name: KEVIANNA KLEPINGER MRN: [id number] DOB: 06-15-1955   Cancelled Treatment:    Reason Eval/Treat Not Completed: Patient at procedure or test/ unavailable (at HD) Will return as schedule allows.   Elder Cyphers, OTR/L [facility] Acute Rehabilitation Office: [phone]   Magnus Ivan 06/03/2022, 8:57 AM

## 2022-06-04 ENCOUNTER — Encounter: Payer: Self-pay | Admitting: *Deleted

## 2022-06-04 ENCOUNTER — Ambulatory Visit: Payer: Self-pay | Admitting: *Deleted

## 2022-06-04 LAB — RENAL FUNCTION PANEL
Albumin: 2.6 g/dL — ABNORMAL LOW (ref 3.5–5.0)
Anion gap: 11 (ref 5–15)
BUN: 42 mg/dL — ABNORMAL HIGH (ref 8–23)
CO2: 28 mmol/L (ref 22–32)
Calcium: 9.5 mg/dL (ref 8.9–10.3)
Chloride: 99 mmol/L (ref 98–111)
Creatinine, Ser: 4.86 mg/dL — ABNORMAL HIGH (ref 0.44–1.00)
GFR, Estimated: 9 mL/min — ABNORMAL LOW (ref >60)
Glucose, Bld: 130 mg/dL — ABNORMAL HIGH (ref 70–99)
Phosphorus: 4.7 mg/dL — ABNORMAL HIGH (ref 2.5–4.6)
Potassium: 4.4 mmol/L (ref 3.5–5.1)
Sodium: 138 mmol/L (ref 135–145)

## 2022-06-04 LAB — GLUCOSE, CAPILLARY
Glucose-Capillary: 198 mg/dL — ABNORMAL HIGH (ref 70–99)
Glucose-Capillary: 174 mg/dL — ABNORMAL HIGH (ref 70–99)
Glucose-Capillary: 187 mg/dL — ABNORMAL HIGH (ref 70–99)
Glucose-Capillary: 179 mg/dL — ABNORMAL HIGH (ref 70–99)

## 2022-06-04 MED ORDER — CHLORHEXIDINE GLUCONATE CLOTH 2 % EX PADS
6.0000 | MEDICATED_PAD | Freq: Every day | CUTANEOUS | Status: DC
  Administered 2022-06-04 – 2022-06-09 (×6): 6 via TOPICAL

## 2022-06-04 MED ORDER — DARBEPOETIN ALFA 100 MCG/0.5ML IJ SOSY
100.0000 ug | PREFILLED_SYRINGE | INTRAMUSCULAR | Status: DC
  Administered 2022-06-08: 100 ug via SUBCUTANEOUS
  Filled 2022-06-04: qty 0.5

## 2022-06-04 NOTE — TOC Progression Note (Signed)
Transition of Care [facility]) - Progression Note    Patient Details  Name: Kathryn Beck MRN: [id number] Date of Birth: 09-02-1955  Transition of [facility]) CM/SW Contact  Joanne Chars, LCSW Phone Number: 06/04/2022, 10:02 AM  Clinical Narrative:   CSW spoke with pt about current SNF offers, [facility] unable to offer a bed.  She asked CSW to speak with her daughter [name].  CSW spoke to daughter.  Her first choice remains CIR, would like to hear the results of the appeal before agreeing to SNF.  Bed offers given to her for SNF, she does want to hear from [facility] as well.  CSW reached out to [facility] for response.      Expected Discharge Plan: IP Rehab Facility Barriers to Discharge: Continued Medical Work up, SNF Pending bed offer  Expected Discharge Plan and Services In-house Referral: Clinical Social Work   Post Acute Care Choice: IP Rehab Living arrangements for the past 2 months: Single Family Home                                       Social Determinants of Health (SDOH) Interventions SDOH Screenings   Food Insecurity: No Food Insecurity (05/30/2022)  Housing: Low Risk  (05/30/2022)  Transportation Needs: No Transportation Needs (05/30/2022)  Utilities: Not At Risk (05/30/2022)  Alcohol Screen: Low Risk  (05/12/2022)  Depression (PHQ2-9): High Risk (05/12/2022)  Financial Resource Strain: Medium Risk (05/12/2022)  Physical Activity: Inactive (05/12/2022)  Social Connections: Socially Integrated (05/12/2022)  Stress: No Stress Concern Present (05/12/2022)  Tobacco Use: Low Risk  (06/04/2022)    Readmission Risk Interventions    05/01/2022    3:54 PM  Readmission Risk Prevention Plan  Transportation Screening Complete  HRI or [place] Complete  Palliative Care Screening Not Applicable  Medication Review (RN Care Manager) Complete

## 2022-06-04 NOTE — Progress Notes (Signed)
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  Pt is referred to PT for mobility and safety education.  PMHx:  PVD, R foot ulcer, OA, anemia, stroke, HD with Lupper arm HD port, PNA, Covid 19, DM, hypoxia, HTN, HA, cardiac cath, back pain, depression    PT Comments    Pt was received in supine and agreeable to session with family present. Session focused on sit to stand transfers and standing tolerance. Pt able to tolerate standing from EOB x1 and stedy paddles x3 with focus on LLE eccentric control. Pt demonstrated improved standing balance with decreased reliance on BUEs this session. When sitting to recliner, pt able to improve control for the first third of the descent and then was unable to control the remainder of the descent to chair. Pt continues to benefit from PT services to progress toward functional mobility goals.    Recommendations for follow up therapy are one component of a multi-disciplinary discharge planning process, led by the attending physician.  Recommendations may be updated based on patient status, additional functional criteria and insurance authorization.  Follow Up Recommendations  Acute inpatient rehab (3hours/day)     Assistance Recommended at Discharge Frequent or constant Supervision/Assistance  Patient can return home with the following Assistance with cooking/housework;Assist for transportation;Help with stairs or ramp for entrance;A lot of help with walking and/or transfers   Equipment Recommendations  None recommended by PT    Recommendations for Other Services       Precautions / Restrictions Precautions Precautions: Fall;Other (comment) Precaution Comments: wound vac Required Braces or Orthoses: Other Brace Other Brace: limb protector Restrictions Weight Bearing  Restrictions: Yes RLE Weight Bearing: Non weight bearing     Mobility  Bed Mobility Overal bed mobility: Needs Assistance Bed Mobility: Supine to Sit     Supine to sit: HOB elevated, Min guard     General bed mobility comments: increased time and min guard for safety    Transfers Overall transfer level: Needs assistance Equipment used: Ambulation equipment used Transfers: Sit to/from Stand, Bed to chair/wheelchair/BSC Sit to Stand: Mod assist, +2 physical assistance, Min assist           General transfer comment: Mod A +2 to stand from EOB and min A +2 to stand from stedy paddles. Heavy reliance on BUEs and cues for sequence. Pt demonstrated decreased eccentric control during sitting. Transfer via Lift Equipment: Stedy  Ambulation/Gait               General Gait Details: unable at this time      Balance Overall balance assessment: Needs assistance Sitting-balance support: Feet supported, No upper extremity supported Sitting balance-Leahy Scale: Fair Sitting balance - Comments: sitting EOB   Standing balance support: During functional activity, Bilateral upper extremity supported, Reliant on assistive device for balance Standing balance-Leahy Scale: Poor Standing balance comment: with stedy support                            Cognition Arousal/Alertness: Awake/alert Behavior During Therapy: WFL for tasks assessed/performed Overall Cognitive Status: Impaired/Different from baseline                                 General Comments: Family  reporting that pt has poor short-term memory and may need to be reminded how she needs to complete a task        Exercises      General Comments General comments (skin integrity, edema, etc.): Pt's sisters and aunt present throughout session      Pertinent Vitals/Pain Pain Assessment Pain Assessment: Faces Faces Pain Scale: Hurts a little bit Pain Location: RLE Pain Descriptors /  Indicators: Sore, Operative site guarding Pain Intervention(s): Limited activity within patient's tolerance, Monitored during session, Repositioned     PT Goals (current goals can now be found in the care plan section) Acute Rehab PT Goals Patient Stated Goal: to get to walk again PT Goal Formulation: With patient Time For Goal Achievement: 06/12/22 Potential to Achieve Goals: Good Progress towards PT goals: Progressing toward goals    Frequency    Min 3X/week      PT Plan Current plan remains appropriate       AM-PAC PT "6 Clicks" Mobility   Outcome Measure  Help needed turning from your back to your side while in a flat bed without using bedrails?: A Little Help needed moving from lying on your back to sitting on the side of a flat bed without using bedrails?: A Little Help needed moving to and from a bed to a chair (including a wheelchair)?: Total Help needed standing up from a chair using your arms (e.g., wheelchair or bedside chair)?: Total Help needed to walk in hospital room?: Total Help needed climbing 3-5 steps with a railing? : Total 6 Click Score: 10    End of Session Equipment Utilized During Treatment: Gait belt Activity Tolerance: Patient tolerated treatment well Patient left: in chair;with family/visitor present;with call bell/phone within reach;with chair alarm set Nurse Communication: Mobility status;Need for lift equipment PT Visit Diagnosis: Other abnormalities of gait and mobility (R26.89);Muscle weakness (generalized) (M62.81);Difficulty in walking, not elsewhere classified (R26.2)     Time: [id number] PT Time Calculation (min) (ACUTE ONLY): 20 min  Charges:  $Therapeutic Activity: 8-22 mins                     Michelle Nasuti, PTA Acute Rehabilitation Services Secure Chat Preferred  [phone]    Michelle Nasuti 06/04/2022, 2:34 PM

## 2022-06-04 NOTE — Patient Instructions (Signed)
Visit Information  Thank you for taking time to visit with me today. Please don't hesitate to contact me if I can be of assistance to you.   Following are the goals we discussed today:   Goals Addressed             This Visit's Progress    Receive Assistance Applying for Aid & Attendance Benefits, through [employer].   On track    Care Coordination Interventions:  Interventions Today    Flowsheet Row Most Recent Value  Chronic Disease   Chronic disease during today's visit Diabetes, Hypertension (HTN), Chronic Kidney Disease/End Stage Renal Disease (ESRD), Congestive Heart Failure (CHF), Other  [Neuropathy, Chronic Pain Syndrome, Anxiety, Depression, Memory Loss, Neurocognitive Disorder & Requires Maximum Assistance with Activities of Daily Living]  General Interventions   General Interventions Discussed/Reviewed General Interventions Reviewed, Annual Eye Exam, General Interventions Discussed, Labs, Annual Foot Exam, Lipid Profile, Durable Medical Equipment (DME), Vaccines, Health Screening, [employer], Doctor Visits, Communication with, Level of Care  [Primary Care Provider]  Labs Hgb A1c every 3 months, Kidney Function  Vaccines COVID-19, Flu, Pneumonia, RSV, Shingles, Tetanus/Pertussis/Diphtheria  [Encouraged]  Doctor Visits Discussed/Reviewed Doctor Visits Discussed, Doctor Visits Reviewed, Annual Wellness Visits, PCP, Specialist  [Encouraged]  Health Screening Colonoscopy, Mammogram  [Encouraged]  Durable Medical Equipment (DME) BP Cuff, Glucomoter, Environmental consultant, Wheelchair, Estate agent  PCP/Specialist Visits Compliance with follow-up visit  Communication with PCP/Specialists, RN  Level of Care Adult Daycare, Location manager, Assisted Living, [place]  Applications Medicaid, Personal Care Services  Exercise Interventions   Exercise Discussed/Reviewed Exercise Discussed, Exercise Reviewed, Physical  Activity, Assistive device use and maintanence  Physical Activity Discussed/Reviewed Physical Activity Discussed, Physical Activity Reviewed, Types of exercise, Home Exercise Program (HEP)  [Encouraged]  Education Interventions   Education Provided Provided Engineer, site, Provided Web-based Education, Provided Education  Provided Verbal Education On Nutrition, Foot Care, Eye Care, Labs, Blood Sugar Monitoring, Mental Health/Coping with Illness, Applications, Exercise, Medication, When to see the doctor, [employer], Engineer, materials, [place] Discussed, Mental Health Reviewed, Coping Strategies, Crisis, Anxiety, Depression, Grief and Loss, Substance Abuse, Suicide  Nutrition Interventions   Nutrition Discussed/Reviewed Nutrition Discussed, Nutrition Reviewed, Adding fruits and vegetables, Fluid intake, Decreasing sugar intake, Increaing proteins, Decreasing fats, Decreasing salt  Pharmacy Interventions   Pharmacy Dicussed/Reviewed Pharmacy Topics Discussed, Pharmacy Topics Reviewed, Medication Adherence, Affording Medications, Referral to Pharmacist  Medication Adherence Unable to refill medication  Referral to Pharmacist Cannot afford medications  Safety Interventions   Safety Discussed/Reviewed Safety Discussed, Safety Reviewed, Fall Risk, Home Safety  Home Safety Assistive Devices, Need for home safety assessment, Refer for home visit, Contact provider for referral to PT/OT, Refer for community resources, Contact home health agency  Advanced Directive Interventions   Advanced Directives Discussed/Reviewed Advanced Directives Discussed, Advanced Directives Reviewed, Advanced Care Planning, Provided resource for acquiring and filling out documents     Active Listening & Reflection Utilized.  Emotional Support Provided. Verbalization of Feelings Encouraged.  Caregiver  Stress Acknowledged. Symptoms of Caregiver Burnout Validated. Caregiver Resources Discussed. Caregiver Support Groups Reviewed. Self-Enrollment in Caregiver Support Group of Interest Emphasized. Solution-Focused Strategies Promoted. Problem Solving Interventions Activated. Task-Centered Solutions Implemented.  Cognitive Behavioral Therapy Performed.  Client-Centered Therapy Indicated. Acceptance & Commitment Therapy Initiated. CSW Collaboration with Daughter, [name] to [place] Placement for Patient, for [employer], Upon Discharge from  [place] with Daughter, [name] to Confirm Denial for [facility] in Placement at [facility], Currently in [place]. CSW Collaboration with Daughter, [name] to [employer] in Continuing Completion of Application for Aid & Attendance Benefits, through [employer], at Present Time. CSW Collaboration with Daughter, [name] to Confirm Plan for Patient to Return Home to Live with Husband, Upon Completion of Short-Term Rehabilitative Services in [place].      Our next appointment is by telephone on 06/22/2022 at 9:00 am.  Please call the care guide team at [phone] if you need to cancel or reschedule your appointment.   If you are experiencing a Mental Health or [place] or need someone to talk to, please call the Suicide and Crisis Lifeline: 988 call the [place] National Suicide Prevention Lifeline: 612 422 6322 or TTY: (314)275-4331 TTY 415-335-4366) to talk to a trained counselor call 1-800-273-TALK (toll free, 24 hour hotline) go to [facility] [address] [place] [phone]) call the [place]: 365-743-2004 call 911  Patient verbalizes understanding of instructions and care plan provided today and agrees to view in [place].  Active MyChart status and patient understanding of how to access instructions and care plan via MyChart confirmed with patient.     Telephone follow up appointment with care management team member scheduled for:  06/22/2022 at 9:00 am.  Nat Christen, BSW, MSW, [place]  Licensed Clinical Social Worker  [place]  Mailing [place]. [address], [place], [place] [address] Physical Address-[address] [place], [place] [address] Toll Free Main # [phone] Fax # [phone] Cell # [phone] [email or web address]

## 2022-06-04 NOTE — Progress Notes (Signed)
Patient ID: Kathryn Beck, female   DOB: 1955-04-19, 67 y.o.   MRN: [id number] Inpatient rehab was denied by insurance.  Patient and her husband state they would like to discharge to the [facility] in [place].

## 2022-06-04 NOTE — Progress Notes (Addendum)
[facility] Progress Note   Subjective:   Patient seen and examined at bedside, eating breakfast.  Disappointed that insurance denied CIR, plans to appeal.  No other specific complaints.  Denies CP, SOB, abdominal pain and n/v/d.    Objective Vitals:   06/03/22 1306 06/03/22 1402 06/03/22 2007 06/04/22 0452  BP: (!) 134/56 (!) 146/61 (!) 128/52 (!) 139/58  Pulse: 68 70 70 69  Resp: '12 18 15 15  '$ Temp: 97.7 F (36.5 C) 98 F (36.7 C) 98.3 F (36.8 C) 98.6 F (37 C)  TempSrc: Oral Oral Oral   SpO2: 98% 99% 97% 99%  Weight: 82.4 kg     Height:       Physical Exam General:well appearing female in NAD Heart:RRR Lungs:CTAB, nml WOB on RA Abdomen:soft, NTND Extremities:R BKA, no LE edema Dialysis Access: LU AVF +b/t   Filed Weights   06/01/22 0804 06/03/22 0805 06/03/22 1306  Weight: 85.4 kg 85.8 kg 82.4 kg    Intake/Output Summary (Last 24 hours) at 06/04/2022 0723 Last data filed at 06/03/2022 1500 Gross per 24 hour  Intake 344.95 ml  Output 3 ml  Net 341.95 ml    Additional Objective Labs: Basic Metabolic Panel: Recent Labs  Lab 06/01/22 0333 06/03/22 0143 06/04/22 0218  NA 138 139 138  K 4.5 5.3* 4.4  CL 98 100 99  CO2 '26 26 28  '$ GLUCOSE 83 260* 130*  BUN 50* 58* 42*  CREATININE 6.82* 6.30* 4.86*  CALCIUM 8.3* 8.6* 9.5  PHOS 5.9* 6.2* 4.7*   Liver Function Tests: Recent Labs  Lab 06/01/22 0333 06/03/22 0143 06/04/22 0218  ALBUMIN 2.4* 2.4* 2.6*   No results for input(s): "LIPASE", "AMYLASE" in the last 168 hours. CBC: Recent Labs  Lab 05/30/22 0432 05/31/22 0455 06/01/22 0333 06/03/22 0143  WBC 12.5* 10.4 8.3 7.8  NEUTROABS  --   --  5.6  --   HGB 8.6* 8.5* 8.1* 8.2*  HCT 28.5* 27.5* 27.4* 26.1*  MCV 82.4 82.3 83.8 82.3  PLT 249 218 237 267   Blood Culture    Component Value Date/Time   SDES  11/07/2017 2340    URINE, CLEAN CATCH Performed at [facility], [address] [place], [place] [address]    [facility]   11/07/2017 2340    NONE Performed at [facility], [address], [place], [place] [address]    CULT (A) 11/07/2017 2340    <10,000 COLONIES/mL INSIGNIFICANT GROWTH Performed at [facility] [address], [place], [place] [address]    REPTSTATUS 11/09/2017 FINAL 11/07/2017 2340    Cardiac Enzymes: No results for input(s): "CKTOTAL", "CKMB", "CKMBINDEX", "TROPONINI" in the last 168 hours. CBG: Recent Labs  Lab 06/02/22 1941 06/03/22 0647 06/03/22 1111 06/03/22 1647 06/03/22 2008  GLUCAP 220* 209* 144* 255* 155*   Iron Studies: No results for input(s): "IRON", "TIBC", "TRANSFERRIN", "FERRITIN" in the last 72 hours. Lab Results  Component Value Date   INR 1.0 03/03/2019   INR 1.17 09/02/2017   Studies/Results: No results found.  Medications:  ferric gluconate (FERRLECIT) IVPB Stopped (06/03/22 1238)   magnesium sulfate bolus IVPB      vitamin C  1,000 mg Oral Daily   aspirin  162 mg Oral Daily   calcitRIOL  0.25 mcg Oral BID WC   Chlorhexidine Gluconate Cloth  6 each Topical Q0600   Chlorhexidine Gluconate Cloth  6 each Topical Q0600   clopidogrel  75 mg Oral Q breakfast   darbepoetin (ARANESP)  injection - DIALYSIS  40 mcg Subcutaneous Q Mon-1800   docusate sodium  100 mg Oral Daily   DULoxetine  60 mg Oral Daily   ezetimibe  10 mg Oral Daily   furosemide  40 mg Oral BID   insulin aspart  0-9 Units Subcutaneous TID WC   insulin glargine-yfgn  64 Units Subcutaneous Daily   ketotifen  1 drop Both Eyes BID   metoprolol succinate  50 mg Oral QPM   nutrition supplement (JUVEN)  1 packet Oral BID BM   pantoprazole  40 mg Oral Daily   pregabalin  50 mg Oral TID   sertraline  50 mg Oral Daily   sucroferric oxyhydroxide  1,000 mg Oral TID WC   sucroferric oxyhydroxide  500 mg Oral With snacks   zinc sulfate  220 mg Oral Daily    Dialysis Orders: [facility] MWF 3h 37mn   84kg  400/500   LUA AVF  15ga  Heparin 1000 + 1000u/hr   Home meds include -  aspirin, renavite, plavix, cymbalta, zetia, lasix 40 bid, insulin aspart/ glargine, metoprolol xl 50 hs, lyrica, zoloft, velphoro 1 gm ac tid, prns/ vits/ supps    Assessment/Plan: SP R BKA - on 3/08 by Dr. DSharol Given  Insurance denied CIR, appealing the decision.  ESRD - on HD MWF w/ [facility]. HD tomorrow per regular schedule.  HTN/ volume - BP's are okay. On Toprol XL '50mg'$  qd and furosemide '40mg'$  qd. Does not appear overloaded, UF as tolerated.  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  Follow trend, transfuse prn. tsat 19% - order iron course. Started ESA - 469m qMon.. 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  Continue binders.  Not on VDRA. DM2 on insulin.  DM management per primary team  Nutrition - renal diet w/fluid restrictions.  LiJen MowPA-C [facility] 06/04/2022,7:23 AM  LOS: 6 days    Seen and examined independently.  Agree with note and exam as documented above by physician extender and as noted here.  She doesn't understand why insurance is not approving her rehab stay   General adult female in bed in no acute distress HEENT normocephalic atraumatic extraocular movements intact sclera anicteric Neck supple trachea midline Lungs clear to auscultation bilaterally normal work of breathing at rest on room air Heart S1S2 no rub Abdomen soft nontender nondistended Extremities no edema left leg or residual right limb; right BKA   Psych normal mood and affect Access LUE AVF with bruit and thrill   S/p right BKA with ortho CIR was denied per charting.  Inpatient rehab would certainly seem clinically indicated   ESRD - HD per MWF schedule   DM type 2 - per primary team. Note pharmacy assisting with insulin   HTN - controlled   Anemia CKD - we have started ESA; post-op anemia.  Plan to titrate next dose to 100 mcg weekly on Mondays   Disposition - per primary team - ortho.  Awaiting rehab.     LoClaudia DesanctisMD 06/04/2022  8:06 AM

## 2022-06-04 NOTE — Progress Notes (Signed)
Mobility Specialist Progress Note   06/04/22 1500  Mobility  Activity Transferred from chair to bed  Level of Assistance Minimal assist, patient does 75% or more  Assistive Device Stedy  Range of Motion/Exercises Active;Left leg;Right arm;Left arm  RLE Weight Bearing NWB  Activity Response Tolerated well   Patient received in recliner requesting assistance back to bed. Required mod-min A to boost into standing from low surface of recliner chair. Transferred back to bed via stedy device without complaint or incident. Was left in supine with all needs met, call bell in reach.   Kathryn Beck, BS EXP Mobility Specialist Please contact via SecureChat or Rehab office at [phone]

## 2022-06-04 NOTE — Patient Outreach (Signed)
Care Coordination   Follow Up Visit Note   06/04/2022  Name: Kathryn Beck MRN: [id number] DOB: [birth year]  Kathryn Beck is a 67 y.o. year old female who sees Fayrene Helper, MD for primary care. I spoke with Waldon Reining by phone today.  What matters to the patients health and wellness today?   Receive Assistance Applying for Aid & Attendance Benefits, through [employer].   Goals Addressed             This Visit's Progress    Receive Assistance Applying for Aid & Attendance Benefits, through [employer].   On track    Care Coordination Interventions:  Interventions Today    Flowsheet Row Most Recent Value  Chronic Disease   Chronic disease during today's visit Diabetes, Hypertension (HTN), Chronic Kidney Disease/End Stage Renal Disease (ESRD), Congestive Heart Failure (CHF), Other  [Neuropathy, Chronic Pain Syndrome, Anxiety, Depression, Memory Loss, Neurocognitive Disorder & Requires Maximum Assistance with Activities of Daily Living]  General Interventions   General Interventions Discussed/Reviewed General Interventions Reviewed, Annual Eye Exam, General Interventions Discussed, Labs, Annual Foot Exam, Lipid Profile, Durable Medical Equipment (DME), Vaccines, Health Screening, [employer], Doctor Visits, Communication with, Level of Care  [Primary Care Provider]  Labs Hgb A1c every 3 months, Kidney Function  Vaccines COVID-19, Flu, Pneumonia, RSV, Shingles, Tetanus/Pertussis/Diphtheria  [Encouraged]  Doctor Visits Discussed/Reviewed Doctor Visits Discussed, Doctor Visits Reviewed, Annual Wellness Visits, PCP, Specialist  [Encouraged]  Health Screening Colonoscopy, Mammogram  [Encouraged]  Durable Medical Equipment (DME) BP Cuff, Glucomoter, Environmental consultant, Wheelchair, Estate agent  PCP/Specialist Visits Compliance with follow-up visit  Communication with PCP/Specialists, RN  Level of Care Adult Daycare, Location manager,  Assisted Living, [place]  Applications Medicaid, Personal Care Services  Exercise Interventions   Exercise Discussed/Reviewed Exercise Discussed, Exercise Reviewed, Physical Activity, Assistive device use and maintanence  Physical Activity Discussed/Reviewed Physical Activity Discussed, Physical Activity Reviewed, Types of exercise, Home Exercise Program (HEP)  [Encouraged]  Education Interventions   Education Provided Provided Engineer, site, Provided Web-based Education, Provided Education  Provided Verbal Education On Nutrition, Foot Care, Eye Care, Labs, Blood Sugar Monitoring, Mental Health/Coping with Illness, Applications, Exercise, Medication, When to see the doctor, [employer], Engineer, materials, [place] Discussed, Mental Health Reviewed, Coping Strategies, Crisis, Anxiety, Depression, Grief and Loss, Substance Abuse, Suicide  Nutrition Interventions   Nutrition Discussed/Reviewed Nutrition Discussed, Nutrition Reviewed, Adding fruits and vegetables, Fluid intake, Decreasing sugar intake, Increaing proteins, Decreasing fats, Decreasing salt  Pharmacy Interventions   Pharmacy Dicussed/Reviewed Pharmacy Topics Discussed, Pharmacy Topics Reviewed, Medication Adherence, Affording Medications, Referral to Pharmacist  Medication Adherence Unable to refill medication  Referral to Pharmacist Cannot afford medications  Safety Interventions   Safety Discussed/Reviewed Safety Discussed, Safety Reviewed, Fall Risk, Home Safety  Home Safety Assistive Devices, Need for home safety assessment, Refer for home visit, Contact provider for referral to PT/OT, Refer for community resources, Contact home health agency  Advanced Directive Interventions   Advanced Directives Discussed/Reviewed Advanced Directives Discussed, Advanced Directives  Reviewed, Advanced Care Planning, Provided resource for acquiring and filling out documents     Active Listening & Reflection Utilized.  Emotional Support Provided. Verbalization of Feelings Encouraged.  Caregiver Stress Acknowledged. Symptoms of Caregiver Burnout Validated. Caregiver Resources Discussed. Caregiver Support Groups Reviewed. Self-Enrollment in Caregiver Support Group of Interest Emphasized. Solution-Focused Strategies Promoted. Problem Solving Interventions Activated. Task-Centered Solutions  Implemented.  Cognitive Behavioral Therapy Performed.  Client-Centered Therapy Indicated. Acceptance & Commitment Therapy Initiated. CSW Collaboration with Daughter, [name] to [place] Placement for Patient, for [employer], Upon Discharge from [place] with Daughter, [name] to Confirm Denial for [place] in Placement at [facility], Currently in [place]. CSW Collaboration with Daughter, [name] to [employer] in Continuing Completion of Application for Aid & Attendance Benefits, through [employer], at Present Time. CSW Collaboration with Daughter, [name] to Confirm Plan for Patient to Return Home to Live with Husband, Upon Completion of Short-Term Rehabilitative Services in [place].      SDOH assessments and interventions completed:  Yes.  Care Coordination Interventions:  Yes, provided.   Follow up plan: Follow up call scheduled for 06/22/2022 at 9:00 am.  Encounter Outcome:  Pt. Visit Completed.   Nat Christen, BSW, MSW, LCSW  Licensed Education officer, environmental Health System  Mailing [place] [address], [place], [place] [address] Physical Address-[address], [place], [place] [address] Toll Free Main # [phone] Fax # [phone] Cell #  [phone] [email or web address]

## 2022-06-05 ENCOUNTER — Other Ambulatory Visit (HOSPITAL_COMMUNITY): Payer: Self-pay

## 2022-06-05 ENCOUNTER — Encounter (HOSPITAL_COMMUNITY): Payer: Self-pay | Admitting: Hematology

## 2022-06-05 LAB — CBC
HCT: 29.6 % — ABNORMAL LOW (ref 36.0–46.0)
Hemoglobin: 8.7 g/dL — ABNORMAL LOW (ref 12.0–15.0)
MCH: 24.7 pg — ABNORMAL LOW (ref 26.0–34.0)
MCHC: 29.4 g/dL — ABNORMAL LOW (ref 30.0–36.0)
MCV: 84.1 fL (ref 80.0–100.0)
Platelets: 273 10*3/uL (ref 150–400)
RBC: 3.52 MIL/uL — ABNORMAL LOW (ref 3.87–5.11)
RDW: 18.6 % — ABNORMAL HIGH (ref 11.5–15.5)
WBC: 8.2 10*3/uL (ref 4.0–10.5)
nRBC: 0.4 % — ABNORMAL HIGH (ref 0.0–0.2)

## 2022-06-05 LAB — GLUCOSE, CAPILLARY
Glucose-Capillary: 125 mg/dL — ABNORMAL HIGH (ref 70–99)
Glucose-Capillary: 131 mg/dL — ABNORMAL HIGH (ref 70–99)
Glucose-Capillary: 164 mg/dL — ABNORMAL HIGH (ref 70–99)
Glucose-Capillary: 241 mg/dL — ABNORMAL HIGH (ref 70–99)

## 2022-06-05 LAB — RENAL FUNCTION PANEL
Albumin: 2.6 g/dL — ABNORMAL LOW (ref 3.5–5.0)
Anion gap: 14 (ref 5–15)
BUN: 79 mg/dL — ABNORMAL HIGH (ref 8–23)
CO2: 25 mmol/L (ref 22–32)
Calcium: 9.7 mg/dL (ref 8.9–10.3)
Chloride: 102 mmol/L (ref 98–111)
Creatinine, Ser: 7.73 mg/dL — ABNORMAL HIGH (ref 0.44–1.00)
GFR, Estimated: 5 mL/min — ABNORMAL LOW (ref >60)
Glucose, Bld: 228 mg/dL — ABNORMAL HIGH (ref 70–99)
Phosphorus: 4.3 mg/dL (ref 2.5–4.6)
Potassium: 4.5 mmol/L (ref 3.5–5.1)
Sodium: 141 mmol/L (ref 135–145)

## 2022-06-05 MED ORDER — CALCITRIOL 0.25 MCG PO CAPS
0.2500 ug | ORAL_CAPSULE | ORAL | Status: DC
  Administered 2022-06-08 – 2022-06-10 (×2): 0.25 ug via ORAL
  Filled 2022-06-05 (×2): qty 1

## 2022-06-05 MED ORDER — HEPARIN SODIUM (PORCINE) 1000 UNIT/ML DIALYSIS
2000.0000 [IU] | INTRAMUSCULAR | Status: DC | PRN
  Administered 2022-06-05: 2000 [IU] via INTRAVENOUS_CENTRAL
  Filled 2022-06-05 (×2): qty 2

## 2022-06-05 MED ORDER — HEPARIN SODIUM (PORCINE) 1000 UNIT/ML DIALYSIS: 1000.0000 [IU] | INTRAMUSCULAR | Status: DC | PRN

## 2022-06-05 NOTE — Progress Notes (Signed)
   06/05/22 1157  Vitals  Temp 98.4 F (36.9 C)  Pulse Rate 66  Resp 13  BP (!) 115/57  SpO2 99 %  O2 Device Room Air  Weight 79 kg  Type of Weight Post-Dialysis  Oxygen Therapy  Pulse Oximetry Type Continuous  Oximetry Probe Site Changed No  Patient Activity (if Appropriate) In bed  Post Treatment  Dialyzer Clearance Lightly streaked  Duration of HD Treatment -hour(s) 3.08 hour(s)  Liters Processed 75  Fluid Removed (mL) 1700 mL  Tolerated HD Treatment Yes  AVG/AVF Arterial Site Held (minutes) 10 minutes  AVG/AVF Venous Site Held (minutes) 10 minutes   Received patient in bed to unit.  Alert and oriented.  Informed consent signed and in chart.   [place] duration:3.10  Patient tolerated well.  Transported back to the room  Alert, without acute distress.  Hand-off given to patient's nurse.   Access used: LUAF Access issues: no complications  Total UF [id number] Medication(s) given: ferrelicit 125mg  iv given   [facility]

## 2022-06-05 NOTE — Progress Notes (Signed)
System clotted, tx terminated with 25 mins remaining, RN aware

## 2022-06-05 NOTE — Progress Notes (Signed)
  Inpatient Rehabilitation Admissions Coordinator   I contacted [facility] Medicare appeals to clarify if a determination had been decided. Appeal remains in progress and the due date for their determination is by 3/16. I contacted patient's daughter, Loretha Stapler and she is aware. She states they may decide to take her home with [facility] if appeal is denied.  Danne Baxter, RN, MSN Rehab Admissions Coordinator [phone] 06/05/2022 10:50 AM

## 2022-06-05 NOTE — Procedures (Signed)
Seen and examined on dialysis.  Procedure supervised.  Blood pressure 136/57 and HR 64.  Tolerating goal.  LUE AVF in use.   Noted her calcitriol order for 0.25 mcg BID and have reduced to 0.25 mcg MWF with HD to resume on Monday.  Watch calcium - is rising  Claudia Desanctis, MD 06/05/2022 8:53 AM

## 2022-06-05 NOTE — Plan of Care (Signed)
°  Problem: Cardiovascular: °Goal: Ability to achieve and maintain adequate cardiovascular perfusion will improve °Outcome: Progressing °  °Problem: Health Behavior/Discharge Planning: °Goal: Ability to safely manage health-related needs after discharge will improve °Outcome: Progressing °  °

## 2022-06-05 NOTE — Progress Notes (Addendum)
[facility] Progress Note   Subjective: Seen on HD. Tolerating well. No C/Os. SR on monitor.    Objective Vitals:   06/05/22 0828 06/05/22 0830 06/05/22 0900 06/05/22 0930  BP: (!) 122/52 (!) 136/57 122/64 (!) 113/53  Pulse: 64  61 60  Resp: 16  13 12   Temp:      TempSrc:      SpO2:   99%   Weight:      Height:       Physical Exam General: Pleasant, obese female in NAD Heart: S1,S2, No M/R/G. SR on monitor. Lungs: CTAB Anteriorly. No WOB.  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 No LLE edema Dialysis Access: L AVF cannulated.    Additional Objective Labs: Basic Metabolic Panel: Recent Labs  Lab 06/03/22 0143 06/04/22 0218 06/05/22 0426  NA 139 138 141  K 5.3* 4.4 4.5  CL 100 99 102  CO2 26 28 25   GLUCOSE 260* 130* 228*  BUN 58* 42* 79*  CREATININE 6.30* 4.86* 7.73*  CALCIUM 8.6* 9.5 9.7  PHOS 6.2* 4.7* 4.3   Liver Function Tests: Recent Labs  Lab 06/03/22 0143 06/04/22 0218 06/05/22 0426  ALBUMIN 2.4* 2.6* 2.6*   No results for input(s): "LIPASE", "AMYLASE" in the last 168 hours. CBC: Recent Labs  Lab 05/30/22 0432 05/31/22 0455 06/01/22 0333 06/03/22 0143 06/05/22 0426  WBC 12.5* 10.4 8.3 7.8 8.2  NEUTROABS  --   --  5.6  --   --   HGB 8.6* 8.5* 8.1* 8.2* 8.7*  HCT 28.5* 27.5* 27.4* 26.1* 29.6*  MCV 82.4 82.3 83.8 82.3 84.1  PLT 249 218 237 267 273   Blood Culture    Component Value Date/Time   SDES  11/07/2017 2340    URINE, CLEAN CATCH Performed at [facility], [address], [place], [place] [address]    [facility]  11/07/2017 2340    NONE Performed at [facility], [address] [place], [place] [address]    CULT (A) 11/07/2017 2340    <10,000 COLONIES/mL INSIGNIFICANT GROWTH Performed at [facility] [address] [place], [place] [address]    REPTSTATUS 11/09/2017 FINAL 11/07/2017 2340    Cardiac Enzymes: No results for input(s): "CKTOTAL", "CKMB", "CKMBINDEX", "TROPONINI" in  the last 168 hours. CBG: Recent Labs  Lab 06/03/22 2008 06/04/22 0812 06/04/22 1154 06/04/22 1611 06/04/22 2058  GLUCAP 155* 198* 174* 187* 179*   Iron Studies: No results for input(s): "IRON", "TIBC", "TRANSFERRIN", "FERRITIN" in the last 72 hours. @lablastinr3 @ Studies/Results: No results found. Medications:  ferric gluconate (FERRLECIT) IVPB Stopped (06/03/22 1238)   magnesium sulfate bolus IVPB      vitamin C  1,000 mg Oral Daily   aspirin  162 mg Oral Daily   [START ON 06/08/2022] calcitRIOL  0.25 mcg Oral Q M,W,F-HD   Chlorhexidine Gluconate Cloth  6 each Topical Q0600   Chlorhexidine Gluconate Cloth  6 each Topical Q0600   Chlorhexidine Gluconate Cloth  6 each Topical Q0600   clopidogrel  75 mg Oral Q breakfast   [START ON 06/08/2022] darbepoetin (ARANESP) injection - DIALYSIS  100 mcg Subcutaneous Q Mon-1800   docusate sodium  100 mg Oral Daily   DULoxetine  60 mg Oral Daily   ezetimibe  10 mg Oral Daily   furosemide  40 mg Oral BID   insulin aspart  0-9 Units Subcutaneous TID WC   insulin glargine-yfgn  64 Units Subcutaneous Daily   ketotifen  1 drop  Both Eyes BID   metoprolol succinate  50 mg Oral QPM   nutrition supplement (JUVEN)  1 packet Oral BID BM   pantoprazole  40 mg Oral Daily   pregabalin  50 mg Oral TID   sertraline  50 mg Oral Daily   sucroferric oxyhydroxide  1,000 mg Oral TID WC   sucroferric oxyhydroxide  500 mg Oral With snacks   zinc sulfate  220 mg Oral Daily     Dialysis Orders: [facility] MWF 3h 46min   84kg  400/500   LUA AVF  15ga  Heparin 1000 + 1000u/hr   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  Insurance denied CIR, appealing the decision.  ESRD - on HD MWF w/ [facility]. HD tomorrow per regular schedule.  HTN/ volume - BP's  are okay. On Toprol XL 50mg  qd and furosemide 40mg  qd. Does not appear overloaded, UF as tolerated. Optimize volume with HD.  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   Follow trend, transfuse prn. On ESA/Getting Fe load.  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  Continue binders.  Not on VDRA. DM2 on insulin.  DM management per primary team  Nutrition - renal diet w/fluid restrictions.    Rita H. Brown NP-C 06/05/2022, 9:43 AM  [employer] [phone]   Seen and examined independently.  Agree with note and exam as documented above by physician extender and as noted here.  See my procedure note from today.    Awaiting CIR bed hopefully.   Claudia Desanctis, MD 06/05/2022  4:45 PM

## 2022-06-05 NOTE — TOC Progression Note (Signed)
Transition of Care [facility]) - Progression Note    Patient Details  Name: Kathryn Beck MRN: [id number] Date of Birth: 03-20-1956  Transition of Care [facility]) CM/SW Contact  Joanne Chars, LCSW Phone Number: 06/05/2022, 2:50 PM  Clinical Narrative:   [facility] unable to offer bed.  CSW informed [name].  They are still considering options, still hoping CIR approved.  They are likely going to take pt home if CIR denied, but will make decision after hearing the result of the appeal.     Expected Discharge Plan: IP Rehab Facility Barriers to Discharge: Continued Medical Work up, SNF Pending bed offer  Expected Discharge Plan and Services In-house Referral: Clinical Social Work   Post Acute Care Choice: IP Rehab Living arrangements for the past 2 months: Single Family Home                                       Social Determinants of Health (SDOH) Interventions SDOH Screenings   Food Insecurity: No Food Insecurity (05/30/2022)  Housing: Low Risk  (05/30/2022)  Transportation Needs: No Transportation Needs (05/30/2022)  Utilities: Not At Risk (05/30/2022)  Alcohol Screen: Low Risk  (05/12/2022)  Depression (PHQ2-9): High Risk (05/12/2022)  Financial Resource Strain: Medium Risk (05/12/2022)  Physical Activity: Inactive (05/12/2022)  Social Connections: Socially Integrated (05/12/2022)  Stress: No Stress Concern Present (05/12/2022)  Tobacco Use: Low Risk  (06/04/2022)    Readmission Risk Interventions    05/01/2022    3:54 PM  Readmission Risk Prevention Plan  Transportation Screening Complete  HRI or [place] Complete  Palliative Care Screening Not Applicable  Medication Review (RN Care Manager) Complete

## 2022-06-06 LAB — GLUCOSE, CAPILLARY
Glucose-Capillary: 77 mg/dL (ref 70–99)
Glucose-Capillary: 82 mg/dL (ref 70–99)
Glucose-Capillary: 214 mg/dL — ABNORMAL HIGH (ref 70–99)
Glucose-Capillary: 120 mg/dL — ABNORMAL HIGH (ref 70–99)
Glucose-Capillary: 97 mg/dL (ref 70–99)
Glucose-Capillary: 141 mg/dL — ABNORMAL HIGH (ref 70–99)

## 2022-06-06 MED ORDER — PREGABALIN 25 MG PO CAPS
25.0000 mg | ORAL_CAPSULE | Freq: Two times a day (BID) | ORAL | Status: DC
  Administered 2022-06-06 – 2022-06-10 (×8): 25 mg via ORAL
  Filled 2022-06-06 (×8): qty 1

## 2022-06-06 NOTE — Plan of Care (Signed)
Pt A&Ox4. No c/o pain. Dr. Vonzella Nipple to administer half of 85 semglee due to low blood sugar earlier of 77. Pt blood sugar in afternoon in the 200s no s/s of hypoglycemia.

## 2022-06-06 NOTE — Progress Notes (Addendum)
[facility] Progress Note   Subjective: Seen in room, lying supine in bed. Very pleasant, no C/Os. No issues reported over night. Planning on discharge hopefully 06/08/2022.      Objective Vitals:   06/05/22 1802 06/05/22 2024 06/06/22 0401 06/06/22 0758  BP: (!) 115/54 (!) 135/59 (!) 120/56 (!) 114/53  Pulse: 80 89 65 65  Resp:  16 19 15   Temp:  98.6 F (37 C) 97.9 F (36.6 C) 98.6 F (37 C)  TempSrc:  Oral Oral Oral  SpO2:  94% 97% 98%  Weight:      Height:       Physical Exam General: Pleasant, obese female in NAD Heart: S1,S2, No M/R/G. SR on monitor. Lungs: CTAB Anteriorly. No WOB.  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 No LLE edema Dialysis Access: L AVF  Additional Objective Labs: Basic Metabolic Panel: Recent Labs  Lab 06/03/22 0143 06/04/22 0218 06/05/22 0426  NA 139 138 141  K 5.3* 4.4 4.5  CL 100 99 102  CO2 26 28 25   GLUCOSE 260* 130* 228*  BUN 58* 42* 79*  CREATININE 6.30* 4.86* 7.73*  CALCIUM 8.6* 9.5 9.7  PHOS 6.2* 4.7* 4.3   Liver Function Tests: Recent Labs  Lab 06/03/22 0143 06/04/22 0218 06/05/22 0426  ALBUMIN 2.4* 2.6* 2.6*   No results for input(s): "LIPASE", "AMYLASE" in the last 168 hours. CBC: Recent Labs  Lab 05/31/22 0455 06/01/22 0333 06/03/22 0143 06/05/22 0426  WBC 10.4 8.3 7.8 8.2  NEUTROABS  --  5.6  --   --   HGB 8.5* 8.1* 8.2* 8.7*  HCT 27.5* 27.4* 26.1* 29.6*  MCV 82.3 83.8 82.3 84.1  PLT 218 237 267 273   Blood Culture    Component Value Date/Time   SDES  11/07/2017 2340    URINE, CLEAN CATCH Performed at [facility], [address] [place], [place] [address]    [facility]  11/07/2017 2340    NONE Performed at [facility], [address], [place], [place] [address]    CULT (A) 11/07/2017 2340    <10,000 COLONIES/mL INSIGNIFICANT GROWTH Performed at [facility] [address] [place], [place] [address]    REPTSTATUS 11/09/2017 FINAL 11/07/2017 2340     Cardiac Enzymes: No results for input(s): "CKTOTAL", "CKMB", "CKMBINDEX", "TROPONINI" in the last 168 hours. CBG: Recent Labs  Lab 06/05/22 1630 06/05/22 2126 06/06/22 0759 06/06/22 0808 06/06/22 1051  GLUCAP 164* 241* 77 82 214*   Iron Studies: No results for input(s): "IRON", "TIBC", "TRANSFERRIN", "FERRITIN" in the last 72 hours. @lablastinr3 @ Studies/Results: No results found. Medications:  ferric gluconate (FERRLECIT) IVPB Stopped (06/05/22 1529)   magnesium sulfate bolus IVPB      vitamin C  1,000 mg Oral Daily   aspirin  162 mg Oral Daily   [START ON 06/08/2022] calcitRIOL  0.25 mcg Oral Q M,W,F-HD   Chlorhexidine Gluconate Cloth  6 each Topical Q0600   Chlorhexidine Gluconate Cloth  6 each Topical Q0600   Chlorhexidine Gluconate Cloth  6 each Topical Q0600   clopidogrel  75 mg Oral Q breakfast   [START ON 06/08/2022] darbepoetin (ARANESP) injection - DIALYSIS  100 mcg Subcutaneous Q Mon-1800   docusate sodium  100 mg Oral Daily   DULoxetine  60 mg Oral Daily   ezetimibe  10 mg Oral Daily   furosemide  40 mg Oral BID   insulin aspart  0-9 Units Subcutaneous TID WC   insulin glargine-yfgn  64 Units Subcutaneous Daily   ketotifen  1 drop Both Eyes BID   metoprolol succinate  50 mg Oral QPM   nutrition supplement (JUVEN)  1 packet Oral BID BM   pantoprazole  40 mg Oral Daily   pregabalin  50 mg Oral TID   sertraline  50 mg Oral Daily   sucroferric oxyhydroxide  1,000 mg Oral TID WC   sucroferric oxyhydroxide  500 mg Oral With snacks   zinc sulfate  220 mg Oral Daily     Dialysis Orders: [facility] MWF 3h 2min   84kg  400/500   LUA AVF  15ga  Heparin 1000 + 1000u/hr   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  Insurance denied CIR, appealing the decision.  ESRD  - on HD MWF w/ [facility] . Next HD 06/08/2022 on schedule.  HTN/ volume - BP's are okay. On Toprol XL 50mg  qd and furosemide 40mg  qd. Does not appear overloaded, UF as tolerated. Optimize volume with HD.  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   Follow trend, transfuse prn. On ESA/Getting Fe load.  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  Continue binders.  Not on VDRA. DM2 on insulin.  DM management per primary team  Nutrition - renal diet w/fluid restrictions. Disposition: Hoping for DC 06/08/2022 if rehab bed available.   Rita H. Brown NP-C 06/06/2022, 11:47 AM  [facility] [phone]   Seen and examined independently this AM.  Agree with note and exam as documented above by physician extender and as noted here.  Spoke with her husband at bedside.  They are waiting on final decision about CIR.    General adult female in bed in no acute distress HEENT normocephalic atraumatic extraocular movements intact sclera anicteric Neck supple trachea midline Lungs clear to auscultation bilaterally normal work of breathing at rest on room air Heart S1S2 no rub Abdomen soft nontender nondistended Extremities no edema left leg or residual right limb; right BKA   Psych normal mood and affect Access LUE AVF with bruit and thrill    S/p right BKA with ortho.  They are hoping for CIR   ESRD - HD per MWF schedule. Renal panel in AM.  Would recommend an alternative to cymbalta given ESRD   HTN - controlled    Anemia CKD - we have started ESA; post-op anemia.  Have titrated next dose to 100 mcg weekly on Mondays    Disposition - per primary team - ortho.  Awaiting rehab.     Claudia Desanctis, MD 06/06/2022 2:36 PM

## 2022-06-06 NOTE — TOC Progression Note (Signed)
Transition of Care [facility]) - Progression Note    Patient Details  Name: Kathryn Beck MRN: [id number] Date of Birth: Feb 24, 1956  [facility] CM/SW Contact  Payne Garske [place],  Phone Number: 06/06/2022, 2:30 PM  Clinical Narrative:     Phone call to patient's daughter to check status of the appeal. Per patient's daughter, she has not any updated information. TOC to continue to follow for determination on appeal.  Cherrise Occhipinti, LCSW Transition of Care    Expected Discharge Plan: IP Rehab Facility Barriers to Discharge: Continued Medical Work up, SNF Pending bed offer  Expected Discharge Plan and Services In-house Referral: Clinical Social Work   Post Acute Care Choice: IP Rehab Living arrangements for the past 2 months: Single Family Home                                       Social Determinants of Health (SDOH) Interventions SDOH Screenings   Food Insecurity: No Food Insecurity (05/30/2022)  Housing: Low Risk  (05/30/2022)  Transportation Needs: No Transportation Needs (05/30/2022)  Utilities: Not At Risk (05/30/2022)  Alcohol Screen: Low Risk  (05/12/2022)  Depression (PHQ2-9): High Risk (05/12/2022)  Financial Resource Strain: Medium Risk (05/12/2022)  Physical Activity: Inactive (05/12/2022)  Social Connections: Socially Integrated (05/12/2022)  Stress: No Stress Concern Present (05/12/2022)  Tobacco Use: Low Risk  (06/04/2022)    Readmission Risk Interventions    05/01/2022    3:54 PM  Readmission Risk Prevention Plan  Transportation Screening Complete  HRI or [place] Complete  Palliative Care Screening Not Applicable  Medication Review (RN Care Manager) Complete

## 2022-06-06 NOTE — Progress Notes (Signed)
Patient ID: Kathryn Beck, female   DOB: [birth year], 67 y.o.   MRN: [id number] Rehab in [place] did not have a bed available for patient.  Family is still awaiting appeal for inpatient rehab.  Possible discharge to home.  Anticipate discharge on Monday.  Patient comfortable without complaints this morning.

## 2022-06-06 NOTE — Progress Notes (Signed)
Mobility Specialist Progress Note    06/06/22 1552  Mobility  Activity Stood at bedside  Level of Assistance Moderate assist, patient does 50-74%  Assistive Device Stedy  RLE Weight Bearing NWB  Activity Response Tolerated fair  Mobility Referral Yes  $Mobility charge 1 Mobility   Pt received in bed and agreeable. C/o dizziness and phantom limb pain. Completed x2 STS w/ modA from an elevated EOB. C/o fatigue and unresolved dizziness. Returned to supine with call bell in reach.   Hildred Alamin Mobility Specialist  Please Psychologist, sport and exercise or Rehab Office at [phone]

## 2022-06-07 LAB — RENAL FUNCTION PANEL
Albumin: 2.7 g/dL — ABNORMAL LOW (ref 3.5–5.0)
Anion gap: 16 — ABNORMAL HIGH (ref 5–15)
BUN: 69 mg/dL — ABNORMAL HIGH (ref 8–23)
CO2: 25 mmol/L (ref 22–32)
Calcium: 9.6 mg/dL (ref 8.9–10.3)
Chloride: 100 mmol/L (ref 98–111)
Creatinine, Ser: 7.41 mg/dL — ABNORMAL HIGH (ref 0.44–1.00)
GFR, Estimated: 6 mL/min — ABNORMAL LOW (ref >60)
Glucose, Bld: 207 mg/dL — ABNORMAL HIGH (ref 70–99)
Phosphorus: 4.8 mg/dL — ABNORMAL HIGH (ref 2.5–4.6)
Potassium: 4.5 mmol/L (ref 3.5–5.1)
Sodium: 141 mmol/L (ref 135–145)

## 2022-06-07 LAB — GLUCOSE, CAPILLARY
Glucose-Capillary: 248 mg/dL — ABNORMAL HIGH (ref 70–99)
Glucose-Capillary: 227 mg/dL — ABNORMAL HIGH (ref 70–99)
Glucose-Capillary: 168 mg/dL — ABNORMAL HIGH (ref 70–99)
Glucose-Capillary: 176 mg/dL — ABNORMAL HIGH (ref 70–99)

## 2022-06-07 MED ORDER — PROSOURCE PLUS PO LIQD
30.0000 mL | Freq: Two times a day (BID) | ORAL | Status: DC
  Administered 2022-06-07 – 2022-06-10 (×4): 30 mL via ORAL
  Filled 2022-06-07 (×5): qty 30

## 2022-06-07 MED ORDER — RENA-VITE PO TABS
1.0000 | ORAL_TABLET | Freq: Every day | ORAL | Status: DC
  Administered 2022-06-07 – 2022-06-09 (×3): 1 via ORAL
  Filled 2022-06-07 (×3): qty 1

## 2022-06-07 MED ORDER — NEPRO/CARBSTEADY PO LIQD
237.0000 mL | Freq: Two times a day (BID) | ORAL | Status: DC
  Administered 2022-06-07 – 2022-06-10 (×4): 237 mL via ORAL

## 2022-06-07 NOTE — Progress Notes (Addendum)
[facility] Progress Note   Subjective: Rehab placement still pending. Will order HD here tomorrow. No issues reported overnight. Husband at bedside. Patient difficult to wake up, diaphoretic. Called for help-BS 227. She is now awake, alert and oriented X 3. She denies C/Os other than being too warm.  Discussed plan for HD 1st shift tomorrow.   Objective Vitals:   06/06/22 1700 06/06/22 1958 06/07/22 0413 06/07/22 0751  BP: (!) 125/52 (!) 136/54 134/60 (!) 136/56  Pulse: 73 78 72 68  Resp: 15 18 17 17   Temp: 97.9 F (36.6 C) 98 F (36.7 C) 98.4 F (36.9 C) 98.2 F (36.8 C)  TempSrc: Oral   Oral  SpO2: 96% 93% 93% 96%  Weight:      Height:       Physical Exam General: Pleasant, obese female in NAD Heart: S1,S2, No M/R/G. SR on monitor. Lungs: CTAB Anteriorly. No WOB.  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 No LLE edema Dialysis Access: L AVF +T/B  Additional Objective Labs: Basic Metabolic Panel: Recent Labs  Lab 06/04/22 0218 06/05/22 0426 06/07/22 0321  NA 138 141 141  K 4.4 4.5 4.5  CL 99 102 100  CO2 28 25 25   GLUCOSE 130* 228* 207*  BUN 42* 79* 69*  CREATININE 4.86* 7.73* 7.41*  CALCIUM 9.5 9.7 9.6  PHOS 4.7* 4.3 4.8*   Liver Function Tests: Recent Labs  Lab 06/04/22 0218 06/05/22 0426 06/07/22 0321  ALBUMIN 2.6* 2.6* 2.7*   No results for input(s): "LIPASE", "AMYLASE" in the last 168 hours. CBC: Recent Labs  Lab 06/01/22 0333 06/03/22 0143 06/05/22 0426  WBC 8.3 7.8 8.2  NEUTROABS 5.6  --   --   HGB 8.1* 8.2* 8.7*  HCT 27.4* 26.1* 29.6*  MCV 83.8 82.3 84.1  PLT 237 267 273   Blood Culture    Component Value Date/Time   SDES  11/07/2017 2340    URINE, CLEAN CATCH Performed at [facility], [address] [place], [place] [address]    [facility]  11/07/2017 2340    NONE Performed at [facility], [address], [place], [place] [address]    CULT (A) 11/07/2017 2340    <10,000 COLONIES/mL  INSIGNIFICANT GROWTH Performed at [facility] [address], [place], [place] [address]    REPTSTATUS 11/09/2017 FINAL 11/07/2017 2340    Cardiac Enzymes: No results for input(s): "CKTOTAL", "CKMB", "CKMBINDEX", "TROPONINI" in the last 168 hours. CBG: Recent Labs  Lab 06/06/22 1051 06/06/22 1254 06/06/22 1702 06/06/22 1959 06/07/22 0929  GLUCAP 214* 120* 97 141* 248*   Iron Studies: No results for input(s): "IRON", "TIBC", "TRANSFERRIN", "FERRITIN" in the last 72 hours. @lablastinr3 @ Studies/Results: No results found. Medications:  ferric gluconate (FERRLECIT) IVPB Stopped (06/05/22 1529)   magnesium sulfate bolus IVPB      vitamin C  1,000 mg Oral Daily   aspirin  162 mg Oral Daily   [START ON 06/08/2022] calcitRIOL  0.25 mcg Oral Q M,W,F-HD   Chlorhexidine Gluconate Cloth  6 each Topical Q0600   Chlorhexidine Gluconate Cloth  6 each Topical Q0600   Chlorhexidine Gluconate Cloth  6 each Topical Q0600   clopidogrel  75 mg Oral Q breakfast   [START ON 06/08/2022] darbepoetin (ARANESP) injection - DIALYSIS  100 mcg Subcutaneous Q Mon-1800   docusate sodium  100 mg Oral Daily   DULoxetine  60 mg Oral Daily   ezetimibe  10 mg Oral Daily   furosemide  40 mg Oral BID   insulin aspart  0-9 Units Subcutaneous TID WC   insulin glargine-yfgn  64 Units Subcutaneous Daily   ketotifen  1 drop Both Eyes BID   metoprolol succinate  50 mg Oral QPM   nutrition supplement (JUVEN)  1 packet Oral BID BM   pantoprazole  40 mg Oral Daily   pregabalin  25 mg Oral BID   sertraline  50 mg Oral Daily   sucroferric oxyhydroxide  1,000 mg Oral TID WC   sucroferric oxyhydroxide  500 mg Oral With snacks   zinc sulfate  220 mg Oral Daily     Dialysis Orders: [facility] MWF 3h 65min   84kg  400/500   LUA AVF  15ga  Heparin 1000 units initial bolus then 1000u/hr, stop 1 hour before end of treatment (3200 units IV here).    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  Insurance denied CIR, appealing the decision.  ESRD - on HD MWF w/ [facility]. Next HD 06/08/2022 on schedule.  HTN/ volume - BP's are okay. On Toprol XL 50mg  qd and furosemide 40mg  qd. Does not appear overloaded, UF as tolerated. Optimize volume with HD. Will need lower EDW post BKA on discharge.  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   Follow trend, transfuse prn. On ESA/Getting Fe load.  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  Continue binders.  Not on VDRA. DM2 on insulin.  DM management per primary team  Nutrition - renal diet w/fluid restrictions. Low albumin. Protein supplements and renal vitamin ordered  Disposition: Hoping for DC 06/08/2022 if rehab bed available.   Rita H. Brown NP-C 06/07/2022, 10:46 AM  [facility] [phone]   Seen and examined independently this AM.  Agree with note and exam as documented above by physician extender and as noted here.  They haven't heard anything about status of appeal for CIR  General adult female in bed in no acute distress HEENT normocephalic atraumatic extraocular movements intact sclera anicteric Neck supple trachea midline Lungs clear to auscultation bilaterally normal work of breathing at rest on room air Heart S1S2 no rub Abdomen soft nontender nondistended Extremities no edema left leg or residual right limb; right BKA   Psych normal mood and affect Access LUE AVF with bruit and thrill    S/p right BKA with ortho.  They are hoping for CIR but awaiting on approval for rehab or SNF   ESRD - HD per MWF schedule. Renal panel in AM.  Would recommend an alternative to cymbalta given ESRD   HTN - controlled    Anemia CKD - we have started ESA; post-op anemia.  Have titrated next dose to 100 mcg weekly on Mondays    Disposition - per primary team.   Awaiting rehab vs SNF.    Claudia Desanctis, MD 06/07/2022 1:14 PM

## 2022-06-07 NOTE — Plan of Care (Signed)
  Problem: Activity: Goal: Ability to return to baseline activity level will improve Outcome: Progressing   Problem: Metabolic: Goal: Ability to maintain appropriate glucose levels will improve Outcome: Progressing   Problem: Self-Care: Goal: Ability to meet self-care needs will improve Outcome: Progressing

## 2022-06-07 NOTE — Progress Notes (Signed)
Mobility Specialist Progress Note    06/07/22 1232  Mobility  Activity Transferred from bed to chair  Level of Assistance +2 (takes two people)  Assistive Device Front wheel walker  RLE Weight Bearing NWB  Activity Response Tolerated well  Mobility Referral Yes  $Mobility charge 1 Mobility   Pt received and agreeable. Completed x3 STS w/ modA+2. Pt able to initiate unilateral weight shift for pivot. Left up in chair with call bell in reach and alarm on.   Hildred Alamin Mobility Specialist  Please Contact via SecureChat or Rehab Office at [phone]

## 2022-06-08 ENCOUNTER — Other Ambulatory Visit (HOSPITAL_COMMUNITY): Payer: Self-pay

## 2022-06-08 LAB — RENAL FUNCTION PANEL
Albumin: 2.8 g/dL — ABNORMAL LOW (ref 3.5–5.0)
Anion gap: 15 (ref 5–15)
BUN: 87 mg/dL — ABNORMAL HIGH (ref 8–23)
CO2: 25 mmol/L (ref 22–32)
Calcium: 9.6 mg/dL (ref 8.9–10.3)
Chloride: 102 mmol/L (ref 98–111)
Creatinine, Ser: 9.21 mg/dL — ABNORMAL HIGH (ref 0.44–1.00)
GFR, Estimated: 4 mL/min — ABNORMAL LOW (ref >60)
Glucose, Bld: 134 mg/dL — ABNORMAL HIGH (ref 70–99)
Phosphorus: 5.9 mg/dL — ABNORMAL HIGH (ref 2.5–4.6)
Potassium: 5 mmol/L (ref 3.5–5.1)
Sodium: 142 mmol/L (ref 135–145)

## 2022-06-08 LAB — CBC
HCT: 29.5 % — ABNORMAL LOW (ref 36.0–46.0)
Hemoglobin: 8.7 g/dL — ABNORMAL LOW (ref 12.0–15.0)
MCH: 24.4 pg — ABNORMAL LOW (ref 26.0–34.0)
MCHC: 29.5 g/dL — ABNORMAL LOW (ref 30.0–36.0)
MCV: 82.9 fL (ref 80.0–100.0)
Platelets: 339 10*3/uL (ref 150–400)
RBC: 3.56 MIL/uL — ABNORMAL LOW (ref 3.87–5.11)
RDW: 18.5 % — ABNORMAL HIGH (ref 11.5–15.5)
WBC: 8.5 10*3/uL (ref 4.0–10.5)
nRBC: 0.6 % — ABNORMAL HIGH (ref 0.0–0.2)

## 2022-06-08 LAB — GLUCOSE, CAPILLARY
Glucose-Capillary: 126 mg/dL — ABNORMAL HIGH (ref 70–99)
Glucose-Capillary: 125 mg/dL — ABNORMAL HIGH (ref 70–99)
Glucose-Capillary: 153 mg/dL — ABNORMAL HIGH (ref 70–99)
Glucose-Capillary: 176 mg/dL — ABNORMAL HIGH (ref 70–99)

## 2022-06-08 NOTE — Progress Notes (Signed)
[facility] Progress Note   Subjective:   Patient seen and examined at bedside in dialysis.  Tolerating well so far.  Denies CP, SOB, abdominal pain and n/v/d.  Admits to intermittent pain on lateral right stump.   Objective Vitals:   06/08/22 0951 06/08/22 1000 06/08/22 1020 06/08/22 1022  BP: (!) 115/47 (!) 115/47 (!) 87/33 (!) 90/43  Pulse: 61 62 62 61  Resp: 15 14 15  (!) 23  Temp:      TempSrc:      SpO2: 98% 100% 99% 99%  Weight:      Height:       Physical Exam General:well appearing female in NAD Heart:RRR, no mrg Lungs:CTAB, nml WOB on RA Abdomen:soft, NTND Extremities:R BKA w/wound vac in place, LLE no edema Dialysis Access: LU AVF +b/t  Filed Weights   06/05/22 0810 06/05/22 1157 06/08/22 0745  Weight: 80.7 kg 79 kg 84.2 kg    Intake/Output Summary (Last 24 hours) at 06/08/2022 1031 Last data filed at 06/07/2022 1200 Gross per 24 hour  Intake 240 ml  Output --  Net 240 ml    Additional Objective Labs: Basic Metabolic Panel: Recent Labs  Lab 06/05/22 0426 06/07/22 0321 06/08/22 0436  NA 141 141 142  K 4.5 4.5 5.0  CL 102 100 102  CO2 25 25 25   GLUCOSE 228* 207* 134*  BUN 79* 69* 87*  CREATININE 7.73* 7.41* 9.21*  CALCIUM 9.7 9.6 9.6  PHOS 4.3 4.8* 5.9*   Liver Function Tests: Recent Labs  Lab 06/05/22 0426 06/07/22 0321 06/08/22 0436  ALBUMIN 2.6* 2.7* 2.8*   CBC: Recent Labs  Lab 06/03/22 0143 06/05/22 0426 06/08/22 0436  WBC 7.8 8.2 8.5  HGB 8.2* 8.7* 8.7*  HCT 26.1* 29.6* 29.5*  MCV 82.3 84.1 82.9  PLT 267 273 339   CBG: Recent Labs  Lab 06/07/22 0929 06/07/22 1108 06/07/22 1606 06/07/22 2101 06/08/22 0655  GLUCAP 248* 227* 168* 176* 126*   Medications:  ferric gluconate (FERRLECIT) IVPB Stopped (06/05/22 1529)   magnesium sulfate bolus IVPB      (feeding supplement) PROSource Plus  30 mL Oral BID BM   vitamin C  1,000 mg Oral Daily   aspirin  162 mg Oral Daily   calcitRIOL  0.25 mcg Oral Q  M,W,F-HD   Chlorhexidine Gluconate Cloth  6 each Topical Q0600   Chlorhexidine Gluconate Cloth  6 each Topical Q0600   Chlorhexidine Gluconate Cloth  6 each Topical Q0600   clopidogrel  75 mg Oral Q breakfast   darbepoetin (ARANESP) injection - DIALYSIS  100 mcg Subcutaneous Q Mon-1800   docusate sodium  100 mg Oral Daily   DULoxetine  60 mg Oral Daily   ezetimibe  10 mg Oral Daily   feeding supplement (NEPRO CARB STEADY)  237 mL Oral BID BM   furosemide  40 mg Oral BID   insulin aspart  0-9 Units Subcutaneous TID WC   insulin glargine-yfgn  64 Units Subcutaneous Daily   ketotifen  1 drop Both Eyes BID   metoprolol succinate  50 mg Oral QPM   multivitamin  1 tablet Oral QHS   nutrition supplement (JUVEN)  1 packet Oral BID BM   pantoprazole  40 mg Oral Daily   pregabalin  25 mg Oral BID   sertraline  50 mg Oral Daily   sucroferric oxyhydroxide  1,000 mg Oral TID WC   sucroferric oxyhydroxide  500 mg Oral With snacks   zinc sulfate  220  mg Oral Daily    Dialysis Orders: [facility] MWF 3h 61min   84kg  400/500   LUA AVF  15ga  Heparin 1000 units initial bolus then 1000u/hr, stop 1 hour before end of treatment (3200 units IV here).    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  CIR insurance denial upheld.  ESRD - on HD MWF w/ [facility]. HD today per regular schedule.  HTN/ volume - BP's are okay. On Toprol XL 50mg  qd and furosemide 40mg  qd. Does not appear overloaded, UF as tolerated. Optimize volume with HD. Will need lower EDW post BKA on discharge.  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   Follow trend, transfuse prn. On ESA/Getting Fe load. Aranesp increased to 171mcg qwk on 3/18.  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 Phos mildly elevated.  Continue binders.  Not on VDRA. DM2 on insulin.  DM management per  primary team  Nutrition - renal diet w/fluid restrictions. Low albumin. Protein supplements and renal vitamin ordered   Disposition: Will need new d/c plan since insurance denied CIR.   Jen Mow, PA-C [facility] 06/08/2022,10:31 AM  LOS: 10 days

## 2022-06-08 NOTE — Plan of Care (Signed)

## 2022-06-08 NOTE — Progress Notes (Signed)
   06/08/22 1121  Vitals  Temp 98.9 F (37.2 C)  Pulse Rate 60  Resp 14  BP (!) 103/46 (Simultaneous filing. User may not have seen previous data.)  SpO2 98 %  O2 Device Room Air  Post Treatment  Dialyzer Clearance Lightly streaked  Duration of HD Treatment -hour(s) 3 hour(s)  Hemodialysis Intake (mL) 300 mL  Liters Processed 66.6  Fluid Removed (mL) 1400 mL  Tolerated HD Treatment Yes  AVG/AVF Arterial Site Held (minutes) 6 minutes  AVG/AVF Venous Site Held (minutes) 6 minutes   Received patient in bed to unit.  Alert and oriented.  Informed consent signed and in chart.   TX duration:3hrs per MD orders  Patient tolerated well.  Transported back to the room  Alert, without acute distress.  Hand-off given to patient's nurse.   Access used: LUA AVF Access issues: none  Total UF removed: 1.4L Medication(s) given: none   [facility]

## 2022-06-08 NOTE — Progress Notes (Signed)
Inpatient Rehab Admissions Coordinator:    Spoke with [facility] Medicare this morning. Per Yvetta Coder, the denial for admission to CIR has been upheld. Spoke with patient's daughter, [name] let her know. Other discharge disposition will need to be investigated. Signing off at this time.   Rehab Admissons Coordinator [place], [place], [place] [phone]

## 2022-06-08 NOTE — TOC Progression Note (Signed)
Transition of Care [facility]) - Progression Note    Patient Details  Name: Kathryn Beck MRN: [id number] Date of Birth: 02/16/1956  Transition of Care [facility] CM/SW Contact  Sharin Mons, RN Phone Number: 06/08/2022, 1:07 PM  Clinical Narrative:    NCM called and spoke with daughter [name] regarding pt's disposition needs. [name] informed NCM appeal denial received for CIR. States family now would like for CSW to f/u with [facility] on bed availability. If no bed available family would like for pt to transition to home with home health services, preferable [facility]. NCM spoke  with liaison @ [facility] and learned pt is already active with them.  06/08/2022 CSW informed [facility] without available SNF beds, daughter aware.  Referral made with [facility] with [employer] for W/C and BSC. Equipment will be delivered to bedside prior to d/c. [facility] team following and will continue assisting with needs....  Expected Discharge Plan: [place] (vs home with home health services) Barriers to Discharge: Continued Medical Work up  Expected Discharge Plan and Services In-house Referral: Clinical Social Work   Post Acute Care Choice: IP Rehab Living arrangements for the past 2 months: Single Family Home                                       Social Determinants of Health (SDOH) Interventions SDOH Screenings   Food Insecurity: No Food Insecurity (05/30/2022)  Housing: Low Risk  (05/30/2022)  Transportation Needs: No Transportation Needs (05/30/2022)  Utilities: Not At Risk (05/30/2022)  Alcohol Screen: Low Risk  (05/12/2022)  Depression (PHQ2-9): High Risk (05/12/2022)  Financial Resource Strain: Medium Risk (05/12/2022)  Physical Activity: Inactive (05/12/2022)  Social Connections: Socially Integrated (05/12/2022)  Stress: No Stress Concern Present (05/12/2022)  Tobacco Use: Low Risk  (06/04/2022)    Readmission Risk Interventions    05/01/2022    3:54  PM  Readmission Risk Prevention Plan  Transportation Screening Complete  HRI or [place] Complete  Palliative Care Screening Not Applicable  Medication Review (RN Care Manager) Complete

## 2022-06-09 ENCOUNTER — Ambulatory Visit (HOSPITAL_BASED_OUTPATIENT_CLINIC_OR_DEPARTMENT_OTHER): Payer: 59 | Admitting: Family

## 2022-06-09 ENCOUNTER — Ambulatory Visit: Payer: 59 | Admitting: Family Medicine

## 2022-06-09 ENCOUNTER — Ambulatory Visit: Payer: Self-pay | Admitting: *Deleted

## 2022-06-09 ENCOUNTER — Encounter: Payer: Self-pay | Admitting: *Deleted

## 2022-06-09 ENCOUNTER — Encounter (HOSPITAL_COMMUNITY): Payer: Self-pay | Admitting: Hematology

## 2022-06-09 LAB — GLUCOSE, CAPILLARY
Glucose-Capillary: 217 mg/dL — ABNORMAL HIGH (ref 70–99)
Glucose-Capillary: 176 mg/dL — ABNORMAL HIGH (ref 70–99)
Glucose-Capillary: 142 mg/dL — ABNORMAL HIGH (ref 70–99)
Glucose-Capillary: 113 mg/dL — ABNORMAL HIGH (ref 70–99)

## 2022-06-09 MED ORDER — CHLORHEXIDINE GLUCONATE CLOTH 2 % EX PADS
6.0000 | MEDICATED_PAD | Freq: Every day | CUTANEOUS | Status: DC
  Administered 2022-06-09 – 2022-06-10 (×2): 6 via TOPICAL

## 2022-06-09 MED ORDER — OXYCODONE-ACETAMINOPHEN 5-325 MG PO TABS: 1.0000 | ORAL_TABLET | ORAL | 0 refills | Status: DC | PRN

## 2022-06-09 NOTE — Progress Notes (Signed)
Mobility Specialist Progress Note   06/09/22 1245  Mobility  Activity Repositioned in chair (LE exercises)  Level of Assistance Standby assist, set-up cues, supervision of patient - no hands on  Assistive Device None  Range of Motion/Exercises Passive;Right leg  RLE Weight Bearing NWB  Activity Response Tolerated well   Patient received in recliner and agreeable to participate. Opted to LE exercises. Completed a series of exercises with supervision. Tolerated without complaint or incident. Was left in recliner with all needs met, call bell in reach.   [place] Demyah Smyre, BS EXP Mobility Specialist Please contact via SecureChat or Rehab office at [phone]

## 2022-06-09 NOTE — Discharge Summary (Signed)
Discharge Diagnoses:  Principal Problem:   S/P BKA (below knee amputation), right ([place]) Active Problems:   Gangrene of right foot ([place])   Surgeries: Procedure(s): RIGHT BELOW THE KNEE AMPUTATION on 05/29/2022    Consultants: Treatment Team:  Roney Jaffe, MD  Discharged Condition: Improved  Hospital Course: Kathryn Beck is an 67 y.o. female who was admitted 05/29/2022 with a chief complaint of gangrene of the right foot, with a final diagnosis of Gangrene Right Foot.  Patient was brought to the operating room on 05/29/2022 and underwent Procedure(s): RIGHT BELOW THE KNEE AMPUTATION.    Patient was given perioperative antibiotics:  Anti-infectives (From admission, onward)    Start     Dose/Rate Route Frequency Ordered Stop   05/30/22 1000  ceFAZolin (ANCEF) IVPB 1 g/50 mL premix  Status:  Discontinued        1 g 100 mL/hr over 30 Minutes Intravenous Every 24 hours 05/29/22 1257 05/31/22 1027   05/29/22 0700  ceFAZolin (ANCEF) IVPB 2g/100 mL premix        2 g 200 mL/hr over 30 Minutes Intravenous On call to O.R. 05/29/22 [id number] 05/29/22 1014     .  Patient was given sequential compression devices, early ambulation, and aspirin for DVT prophylaxis.  Recent vital signs: Patient Vitals for the past 24 hrs:  BP Temp Temp src Pulse Resp SpO2  06/09/22 0756 (!) 138/57 98.1 F (36.7 C) Oral 68 17 99 %  06/09/22 0408 (!) 121/41 98.2 F (36.8 C) -- 68 18 96 %  06/08/22 2023 (!) 137/54 97.8 F (36.6 C) -- 67 18 99 %  06/08/22 1627 (!) 123/44 98.2 F (36.8 C) Oral 69 18 97 %  06/08/22 1126 (!) 106/47 -- -- 63 13 100 %  06/08/22 1121 (!) 103/46 98.9 F (37.2 C) Oral 60 14 98 %  06/08/22 1100 (!) 105/46 -- -- -- -- --  06/08/22 1030 (!) 90/43 -- -- -- -- --  06/08/22 1022 (!) 90/43 -- -- 61 (!) 23 99 %  06/08/22 1020 (!) 87/33 -- -- 62 15 99 %  06/08/22 1000 (!) 115/47 -- -- 62 14 100 %  06/08/22 0951 (!) 115/47 -- -- 61 15 98 %  06/08/22 0947 (!) 122/49 -- -- 61 16 98 %   06/08/22 0945 (!) 60/52 -- -- 62 19 99 %  06/08/22 0943 (!) 71/39 -- -- 63 11 100 %  06/08/22 0930 (!) 99/48 -- -- 66 15 100 %  06/08/22 0900 (!) 116/55 -- -- -- -- --  06/08/22 0830 127/87 -- -- 68 14 96 %  .  Recent laboratory studies: No results found.  Discharge Medications:   Allergies as of 06/09/2022       Reactions   Ace Inhibitors Anaphylaxis, Swelling   Penicillins Itching, Swelling, Other (See Comments)   Did it involve swelling of the face/tongue/throat, SOB, or low BP? Unknown Did it involve sudden or severe rash/hives, skin peeling, or any reaction on the inside of your mouth or nose? Unknown Did you need to seek medical attention at a hospital or doctor's office? Unknown When did it last happen?      years  If all above answers are "NO", may proceed with cephalosporin use.   Statins Other (See Comments)   elevated LFT's   Albuterol Swelling        Medication List     TAKE these medications    aspirin 81 MG chewable tablet CHEW 2 TABLETS BY MOUTH  ONCE DAILY What changed: See the new instructions.   azelastine 0.05 % ophthalmic solution Commonly known as: OPTIVAR Place 1 drop into both eyes 2 (two) times daily.   calcitRIOL 0.25 MCG capsule Commonly known as: ROCALTROL TAKE 1 CAPSULE (0.25 MCG TOTAL) BY MOUTH 2 (TWO) TIMES DAILY WITH A MEAL.   clopidogrel 75 MG tablet Commonly known as: PLAVIX Take 1 tablet (75 mg total) by mouth daily with breakfast.   DULoxetine 60 MG capsule Commonly known as: CYMBALTA TAKE (1) CAPSULE BY MOUTH ONCE DAILY. What changed: See the new instructions.   ezetimibe 10 MG tablet Commonly known as: ZETIA Take 1 tablet (10 mg total) by mouth daily.   Fiasp FlexTouch 100 UNIT/ML FlexTouch Pen Generic drug: insulin aspart Inject 18-30 Units into the skin with breakfast, with lunch, and with evening meal. What changed: additional instructions   FreeStyle Libre 2 Reader Devi 1 each by Does not apply route daily.    FreeStyle Libre 2 Sensor Misc 1 each by Does not apply route every 14 (fourteen) days.   furosemide 40 MG tablet Commonly known as: LASIX Take 40 mg by mouth 2 (two) times daily.   hydrOXYzine 25 MG tablet Commonly known as: ATARAX Take 25 mg by mouth every 12 (twelve) hours as needed for itching.   Insulin Pen Needle 32G X 4 MM Misc Use 4x a day   metoprolol succinate 50 MG 24 hr tablet Commonly known as: TOPROL-XL TAKE (1) TABLET BY MOUTH DAILY WITH FOOD *TAKE AFTER DIALYSIS* What changed:  how much to take how to take this when to take this additional instructions   OneTouch Delica Lancets 99991111 Misc Use to check blood sugar 4X daily.   OneTouch Verio test strip Generic drug: glucose blood Use as instructed to check blood sugar 4X daily.   OneTouch Verio w/Device Kit Use to check blood sugar 4X daily.   oxyCODONE-acetaminophen 5-325 MG tablet Commonly known as: PERCOCET/ROXICET Take 1 tablet by mouth every 4 (four) hours as needed.   pregabalin 50 MG capsule Commonly known as: LYRICA TAKE ONE CAPSULE BY MOUTH three times a day What changed:  how much to take how to take this when to take this additional instructions   Rena-Vite Rx 1 MG Tabs Take 1 tablet by mouth daily.   sertraline 50 MG tablet Commonly known as: ZOLOFT TAKE ONE TABLET BY MOUTH EVERY DAY   Toujeo Max SoloStar 300 UNIT/ML Solostar Pen Generic drug: insulin glargine (2 Unit Dial) Inject 80 Units into the skin daily.   [name] 500 MG chewable tablet Generic drug: sucroferric oxyhydroxide Chew 500-1,000 mg by mouth See admin instructions. Take 1000mg  (2 tablets) by mouth with meals and 500mg  (1 tablet) with snacks               Durable Medical Equipment  (From admission, onward)           Start     Ordered   06/08/22 1333  For home use only DME Bedside commode  Once       Question:  Patient needs a bedside commode to treat with the following condition  Answer:  S/P BKA  (below knee amputation), right ([place])   06/08/22 1333   06/08/22 1304  For home use only DME lightweight manual wheelchair with seat cushion  Once       Comments: Patient suffers from bka which impairs their ability to perform daily activities like bathing, dressing in the home.  A walker will not  resolve  issue with performing activities of daily living. A wheelchair will allow patient to safely perform daily activities. Patient is not able to propel themselves in the home using a standard weight wheelchair due to general weakness. Patient can self propel in the lightweight wheelchair. Length of need Lifetime. Accessories: elevating leg rests (ELRs), wheel locks, extensions and anti-tippers.   06/08/22 1305            Diagnostic Studies: VAS US CAROTID  Result Date: 05/29/2022 Carotid Arterial Duplex Study Patient Name:  Kathryn Beck  Date of Exam:   05/28/2022 Medical Rec #: [id number]        Accession #:    [id number] Date of Birth: 1955/08/06        Patient Gender: F Patient Age:   67 years Exam Location:  [facility] Procedure:      VAS US CAROTID Referring Phys: Laurann [place] --------------------------------------------------------------------------------  Indications:       Carotid artery disease and patient denies any cerebrovascular                    symptoms. Risk Factors:      Hypertension, hyperlipidemia, Diabetes, no history of                    smoking. Comparison Study:  05/09/2015 RICA [id number] and LICA [id number] cm/sec. Performing Technologist: Leavy Cella RDCS  Examination Guidelines: A complete evaluation includes B-mode imaging, spectral Doppler, color Doppler, and power Doppler as needed of all accessible portions of each vessel. Bilateral testing is considered an integral part of a complete examination. Limited examinations for reoccurring indications may be performed as noted.  Right Carotid Findings: +----------+--------+--------+--------+------------------+--------+            PSV cm/sEDV cm/sStenosisPlaque DescriptionComments +----------+--------+--------+--------+------------------+--------+ CCA Prox  101     13                                         +----------+--------+--------+--------+------------------+--------+ CCA Mid   93      9                                          +----------+--------+--------+--------+------------------+--------+ CCA Distal93      13              heterogenous               +----------+--------+--------+--------+------------------+--------+ ICA Prox  64      15              smooth                     +----------+--------+--------+--------+------------------+--------+ ICA Mid   114     20                                         +----------+--------+--------+--------+------------------+--------+ ICA Distal117     16                                         +----------+--------+--------+--------+------------------+--------+ ECA       99  11                                         +----------+--------+--------+--------+------------------+--------+ +----------+--------+-------+----------------+-------------------+           PSV cm/sEDV cmsDescribe        Arm Pressure (mmHG) +----------+--------+-------+----------------+-------------------+ [id number]     2      Multiphasic, [id number]                 +----------+--------+-------+----------------+-------------------+ +---------+--------+--+--------+-+---------+ VertebralPSV cm/s69EDV cm/s7Antegrade +---------+--------+--+--------+-+---------+  Left Carotid Findings: +----------+--------+--------+--------+------------------+--------+           PSV cm/sEDV cm/sStenosisPlaque DescriptionComments +----------+--------+--------+--------+------------------+--------+ CCA Prox  67      13                                         +----------+--------+--------+--------+------------------+--------+ CCA Mid   85      12                                          +----------+--------+--------+--------+------------------+--------+ CCA Distal82      15                                         +----------+--------+--------+--------+------------------+--------+ ICA Prox  58      18              heterogenous               +----------+--------+--------+--------+------------------+--------+ ICA Mid   68      17                                         +----------+--------+--------+--------+------------------+--------+ ICA Distal65      14                                         +----------+--------+--------+--------+------------------+--------+ ECA       87      3                                          +----------+--------+--------+--------+------------------+--------+ +----------+--------+--------+-----------------------------+-------------------+           PSV cm/sEDV cm/sDescribe                     Arm Pressure (mmHG) +----------+--------+--------+-----------------------------+-------------------+ Subclavian203     73      Increased EDV and turbulent  255                                           flow consistent with known  arteriovenous fistula                                                      placement                                        +----------+--------+--------+-----------------------------+-------------------+ +---------+--------+--+--------+--+---------+ VertebralPSV cm/s86EDV cm/s12Antegrade +---------+--------+--+--------+--+---------+ Unable to complete left blood pressure due to diaylsis graft.  Summary: Right Carotid: The extracranial vessels were near-normal with only minimal wall                thickening or plaque. Left Carotid: The extracranial vessels were near-normal with only minimal wall               thickening or plaque. Increased EDV and turbulent flow in               subclavian consistent with  known arteriovenous fistula placement. Vertebrals:  Bilateral vertebral arteries demonstrate antegrade flow. Subclavians: Left subclavian artery flow was disturbed. Normal flow hemodynamics              were seen in the right subclavian artery. *See table(s) above for measurements and observations.  Electronically signed by Kathlyn Sacramento MD on 05/29/2022 at 11:45:45 AM.    Final    ECHOCARDIOGRAM COMPLETE  Result Date: 05/28/2022    ECHOCARDIOGRAM REPORT   Patient Name:   Kathryn Beck Date of Exam: 05/28/2022 Medical Rec #:  [id number]       Height:       64.0 in Accession #:    [id number]      Weight:       191.0 lb Date of Birth:  1955-05-04       BSA:          1.918 m Patient Age:    40 years        BP:           178/69 mmHg Patient Gender: F               HR:           86 bpm. Exam Location:  Outpatient Procedure: 2D Echo, 3D Echo, Cardiac Doppler, Color Doppler and Strain Analysis Indications:    R01.1 Murmur  History:        Patient has prior history of Echocardiogram examinations, most                 recent 01/17/2019. Signs/Symptoms:Murmur and Syncope; Risk                 Factors:Diabetes, Hypertension, Dyslipidemia and Non-Smoker.                 ESRD.  Sonographer:    Leavy Cella RDCS Referring Phys: [id number] [place]  1. Left ventricular ejection fraction, by estimation, is 60 to 65%. The left ventricle has normal function. The left ventricle has no regional wall motion abnormalities. There is moderate concentric left ventricular hypertrophy. Left ventricular diastolic parameters are consistent with Grade II diastolic dysfunction (pseudonormalization). Elevated left ventricular end-diastolic pressure.  2. Right ventricular systolic function is normal. The right ventricular size is mildly enlarged. There is normal pulmonary artery systolic pressure.  3. Left atrial size was moderately  dilated.  4. Right atrial size was mildly dilated.  5. The mitral valve is grossly normal.  Trivial mitral valve regurgitation. The mean mitral valve gradient is 4.0 mmHg. Moderate mitral annular calcification.  6. The aortic valve is tricuspid. There is moderate calcification of the aortic valve. There is mild thickening of the aortic valve. Aortic valve regurgitation is trivial. Mild aortic valve stenosis.  7. The inferior vena cava is normal in size with greater than 50% respiratory variability, suggesting right atrial pressure of 3 mmHg. Comparison(s): No significant change from prior study. Conclusion(s)/Recommendation(s): Otherwise normal echocardiogram, with minor abnormalities described in the report. FINDINGS  Left Ventricle: Left ventricular ejection fraction, by estimation, is 60 to 65%. The left ventricle has normal function. The left ventricle has no regional wall motion abnormalities. The left ventricular internal cavity size was normal in size. There is  moderate concentric left ventricular hypertrophy. Left ventricular diastolic parameters are consistent with Grade II diastolic dysfunction (pseudonormalization). Elevated left ventricular end-diastolic pressure. Right Ventricle: The right ventricular size is mildly enlarged. Right vetricular wall thickness was not well visualized. Right ventricular systolic function is normal. There is normal pulmonary artery systolic pressure. The tricuspid regurgitant velocity  is 1.57 m/s, and with an assumed right atrial pressure of 3 mmHg, the estimated right ventricular systolic pressure is 0000000 mmHg. Left Atrium: Left atrial size was moderately dilated. Right Atrium: Right atrial size was mildly dilated. Pericardium: There is no evidence of pericardial effusion. Mitral Valve: The mitral valve is grossly normal. There is mild thickening of the mitral valve leaflet(s). There is mild calcification of the mitral valve leaflet(s). Moderate mitral annular calcification. Trivial mitral valve regurgitation. MV peak gradient, 9.2 mmHg. The mean mitral valve  gradient is 4.0 mmHg. Tricuspid Valve: The tricuspid valve is normal in structure. Tricuspid valve regurgitation is trivial. No evidence of tricuspid stenosis. Aortic Valve: The aortic valve is tricuspid. There is moderate calcification of the aortic valve. There is mild thickening of the aortic valve. Aortic valve regurgitation is trivial. Mild aortic stenosis is present. Aortic valve mean gradient measures 13.0 mmHg. Aortic valve peak gradient measures 21.2 mmHg. Aortic valve area, by VTI measures 1.68 cm. Pulmonic Valve: The pulmonic valve was grossly normal. Pulmonic valve regurgitation is trivial. No evidence of pulmonic stenosis. Aorta: The aortic root, ascending aorta, aortic arch and descending aorta are all structurally normal, with no evidence of dilitation or obstruction. Venous: The inferior vena cava is normal in size with greater than 50% respiratory variability, suggesting right atrial pressure of 3 mmHg. IAS/Shunts: The atrial septum is grossly normal.  LEFT VENTRICLE PLAX 2D LVIDd:         4.90 cm   Diastology LVIDs:         2.37 cm   LV e' medial:    4.79 cm/s LV PW:         1.57 cm   LV E/e' medial:  25.9 LV IVS:        1.52 cm   LV e' lateral:   6.64 cm/s LVOT diam:     2.10 cm   LV E/e' lateral: 18.7 LV SV:         76 LV SV Index:   40 LVOT Area:     3.46 cm                           3D Volume EF:  3D EF:        61 %                          LV EDV:       210 ml                          LV ESV:       83 ml                          LV SV:        127 ml RIGHT VENTRICLE RV Basal diam:  4.88 cm RV Mid diam:    2.91 cm RV S prime:     18.50 cm/s TAPSE (M-mode): 4.0 cm LEFT ATRIUM             Index        RIGHT ATRIUM           Index LA diam:        5.20 cm 2.71 cm/m   RA Area:     18.60 cm LA Vol (A2C):   99.6 ml 51.92 ml/m  RA Volume:   56.50 ml  29.45 ml/m LA Vol (A4C):   56.6 ml 29.50 ml/m LA Biplane Vol: 76.9 ml 40.08 ml/m  AORTIC VALVE AV Area (Vmax):    1.76 cm  AV Area (Vmean):   1.47 cm AV Area (VTI):     1.68 cm AV Vmax:           230.00 cm/s AV Vmean:          176.000 cm/s AV VTI:            0.454 m AV Peak Grad:      21.2 mmHg AV Mean Grad:      13.0 mmHg LVOT Vmax:         117.00 cm/s LVOT Vmean:        74.500 cm/s LVOT VTI:          0.220 m LVOT/AV VTI ratio: 0.48  AORTA Ao Root diam: 3.20 cm Ao Asc diam:  3.50 cm MITRAL VALVE                TRICUSPID VALVE MV Area (PHT): 5.13 cm     TR Peak grad:   9.9 mmHg MV Area VTI:   2.09 cm     TR Vmax:        157.00 cm/s MV Peak grad:  9.2 mmHg MV Mean grad:  4.0 mmHg     SHUNTS MV Vmax:       1.52 m/s     Systemic VTI:  0.22 m MV Vmean:      92.2 cm/s    Systemic Diam: 2.10 cm MV Decel Time: 148 msec MR Peak grad: 82.8 mmHg MR Vmax:      455.00 cm/s MV E velocity: 124.00 cm/s MV A velocity: 129.00 cm/s MV E/A ratio:  0.96 Buford Dresser MD Electronically signed by Buford Dresser MD Signature Date/Time: 05/28/2022/3:26:17 PM    Final     Patient benefited maximally from their hospital stay and there were no complications.     Disposition: Discharge disposition: 01-Home or Self Care      Discharge Instructions     Call MD / Call 911   Complete by: As directed    If you experience chest pain or  shortness of breath, CALL 911 and be transported to the hospital emergency room.  If you develope a fever above 101 F, pus (white drainage) or increased drainage or redness at the wound, or calf pain, call your surgeon's office.   Call MD / Call 911   Complete by: As directed    If you experience chest pain or shortness of breath, CALL 911 and be transported to the hospital emergency room.  If you develope a fever above 101 F, pus (white drainage) or increased drainage or redness at the wound, or calf pain, call your surgeon's office.   Constipation Prevention   Complete by: As directed    Drink plenty of fluids.  Prune juice may be helpful.  You may use a stool softener, such as Colace (over the  counter) 100 mg twice a day.  Use MiraLax (over the counter) for constipation as needed.   Constipation Prevention   Complete by: As directed    Drink plenty of fluids.  Prune juice may be helpful.  You may use a stool softener, such as Colace (over the counter) 100 mg twice a day.  Use MiraLax (over the counter) for constipation as needed.   Diet - low sodium heart healthy   Complete by: As directed    Diet - low sodium heart healthy   Complete by: As directed    Increase activity slowly as tolerated   Complete by: As directed    Increase activity slowly as tolerated   Complete by: As directed    Post-operative opioid taper instructions:   Complete by: As directed    POST-OPERATIVE OPIOID TAPER INSTRUCTIONS: It is important to wean off of your opioid medication as soon as possible. If you do not need pain medication after your surgery it is ok to stop day one. Opioids include: Codeine, Hydrocodone(Norco, Vicodin), Oxycodone(Percocet, oxycontin) and hydromorphone amongst others.  Long term and even short term use of opiods can cause: Increased pain response Dependence Constipation Depression Respiratory depression And more.  Withdrawal symptoms can include Flu like symptoms Nausea, vomiting And more Techniques to manage these symptoms Hydrate well Eat regular healthy meals Stay active Use relaxation techniques(deep breathing, meditating, yoga) Do Not substitute Alcohol to help with tapering If you have been on opioids for less than two weeks and do not have pain than it is ok to stop all together.  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 Maintain the same interval or time between taking each dose and first decrease the dose.  Cut the total daily intake of opioids by one tablet each day Next start to increase the time between doses. The last dose that should be eliminated is the evening dose.      Post-operative  opioid taper instructions:   Complete by: As directed    POST-OPERATIVE OPIOID TAPER INSTRUCTIONS: It is important to wean off of your opioid medication as soon as possible. If you do not need pain medication after your surgery it is ok to stop day one. Opioids include: Codeine, Hydrocodone(Norco, Vicodin), Oxycodone(Percocet, oxycontin) and hydromorphone amongst others.  Long term and even short term use of opiods can cause: Increased pain response Dependence Constipation Depression Respiratory depression And more.  Withdrawal symptoms can include Flu like symptoms Nausea, vomiting And more Techniques to manage these symptoms Hydrate well Eat regular healthy meals Stay active Use relaxation techniques(deep breathing, meditating, yoga) Do Not substitute Alcohol to help  with tapering If you have been on opioids for less than two weeks and do not have pain than it is ok to stop all together.  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 Maintain the same interval or time between taking each dose and first decrease the dose.  Cut the total daily intake of opioids by one tablet each day Next start to increase the time between doses. The last dose that should be eliminated is the evening dose.          Follow-up Information     [name], MD Follow up in 1 week(s).   Specialty: Orthopedic Surgery Contact information: [address] [place] [address] [phone]                  Signed: [name] 06/09/2022, 8:02 AM

## 2022-06-09 NOTE — TOC Progression Note (Addendum)
Transition of Care [facility]) - Progression Note    Patient Details  Name: Kathryn Beck MRN: [id number] Date of Birth: November 11, 1955  Transition of Care [facility]) CM/SW Contact  Joanne Chars, LCSW Phone Number: 06/09/2022, 10:37 AM  Clinical Narrative:   CSW asked to speak with pt and husband by OT after session.   They want to discuss SNF again, discussed that daughter stated yesterday that she wanted pt to DC home.  Daughter [name] called, on speakerphone, pt and husband shared that pt needs SNF, daughter now agreeable.  They would like [facility].    CSW spoke with Destiny/[facility]--pt was declined earlier but she will look at referral again.  Referral resent.    1100: [facility] can accept pt.    New PT note needed for insurance auth.  PT aware.    1315: CSW confirmed with Destiny/ [facility] that they are aware of pt HD at [facility], MWF 630am chair time.   1530: Auth submitted and approved in [place]: [id number], 3 days: 3/20-3/22.     Expected Discharge Plan: [place] (vs home with home health services) Barriers to Discharge: Continued Medical Work up  Expected Discharge Plan and Services In-house Referral: Clinical Social Work   Post Acute Care Choice: IP Rehab Living arrangements for the past 2 months: Single Family Home Expected Discharge Date: 06/09/22               DME Arranged: Bedside commode, Lightweight manual wheelchair with seat cushion DME Agency: [employer] Date DME Agency Contacted: 06/08/22 Time DME Agency Contacted: [phone] Representative spoke with at DME Agency: Brenton Grills     Date [place]: 06/08/22 Time [place]: [id number] Representative spoke with at [place]: [place] ([place]) Interventions SDOH Screenings   Food Insecurity: No Food Insecurity (05/30/2022)  Housing: Low Risk  (05/30/2022)  Transportation Needs: No Transportation Needs (05/30/2022)  Utilities: Not At Risk (05/30/2022)   Alcohol Screen: Low Risk  (05/12/2022)  Depression (PHQ2-9): High Risk (05/12/2022)  Financial Resource Strain: Medium Risk (05/12/2022)  Physical Activity: Inactive (05/12/2022)  Social Connections: Socially Integrated (05/12/2022)  Stress: No Stress Concern Present (05/12/2022)  Tobacco Use: Low Risk  (06/04/2022)    Readmission Risk Interventions    05/01/2022    3:54 PM  Readmission Risk Prevention Plan  Transportation Screening Complete  HRI or [place] Complete  Palliative Care Screening Not Applicable  Medication Review (RN Care Manager) Complete

## 2022-06-09 NOTE — Progress Notes (Signed)
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  Pt is referred to PT for mobility and safety education.  PMHx:  PVD, R foot ulcer, OA, anemia, stroke, HD with Lupper arm HD port, PNA, Covid 19, DM, hypoxia, HTN, HA, cardiac cath, back pain, depression   OT comments  Pt currently at mod to max assist level for simulated LB selfcare sit to stand and stand pivot transfers to the [facility].  Pt with one episode of left knee buckling when attempting stepping with the RW, requiring total assist to be positioned in the recliner as she could not regain her stance.  Talked with pt and spouse about current level of assist while also practicing squat pivot transfers and sliding board transfers at min to mod assist.  Recommend continued SNF rehab post discharge vs home, for greater consistency and practice with transfers and ADL tasks.  After seeing how she did during session, they are in agreement with this instead of home.  Recommend continued acute care OT at this time with transition to SNF level therapy once bed is available.  Will continue to follow.    Recommendations for follow up therapy are one component of a multi-disciplinary discharge planning process, led by the attending physician.  Recommendations may be updated based on patient status, additional functional criteria and insurance authorization.    Follow Up Recommendations  Skilled nursing-short term rehab (<3 hours/day)     Assistance Recommended at Discharge Frequent or constant Supervision/Assistance  Patient can return home with the following  Two people to help with walking and/or transfers;A lot of help with bathing/dressing/bathroom;Assistance with cooking/housework;Assist for transportation;Help with stairs or ramp for entrance   Equipment Recommendations   Other (comment) (TBD next venue of care)       Precautions / Restrictions Precautions Precautions: Fall;Other (comment) Precaution Comments: wound vac Required Braces or Orthoses: Other Brace Other Brace: limb protector Restrictions Weight Bearing Restrictions: Yes RLE Weight Bearing: Non weight bearing       Mobility Bed Mobility Overal bed mobility: Needs Assistance Bed Mobility: Supine to Sit     Supine to sit: HOB elevated, Min guard          Transfers Overall transfer level: Needs assistance Equipment used: Rolling walker (2 wheels) Transfers: Sit to/from Stand, Bed to chair/wheelchair/BSC Sit to Stand: Mod assist     Step pivot transfers: Max assist    Lateral/Scoot Transfers: Min assist, With slide board General transfer comment: Pt with left knee buckling when trying to hop a few steps to the recliner (approximately 3-4').  She needed total assist to be lifted around to the chair by therapist as she could not recover to stand back up.     Balance Overall balance assessment: Needs assistance Sitting-balance support: Feet supported, No upper extremity supported Sitting balance-Leahy Scale: Fair     Standing balance support: During functional activity, Bilateral upper extremity supported, Reliant on assistive device for balance Standing balance-Leahy Scale: Poor Standing balance comment: Pt needs mod to max assist for standing and transfers with use of the RW for support.                           ADL either performed or assessed with clinical judgement   ADL Overall ADL's : Needs assistance/impaired  Lower Body Dressing: Maximal assistance;Sit to/from stand   Toilet Transfer: Maximal assistance;Stand-pivot;Rolling walker (2 wheels)   Toileting- Clothing Manipulation and Hygiene: Maximal assistance;Sit to/from stand       Functional mobility during ADLs: Maximal assistance;Rolling walker (2 wheels) General ADL  Comments: Pt with left knee buckling during stand pivot transfer to the recliner with use of the RW.  Total assist needed to scoot her hips over to the chair to avoid being lowered into the floor.  Practiced stand pivot to the 3:1 with spouse as well and pt had to sit down before reaching the correct alignment of the bedside recliner.  She then completed squat pivots with min to mod assist to the recliner.  Provided sliding board and she was able to complete scooting transfer to the recliner from the EOB.  Discussed benefit of continuing to go to SNF rehab even if it's not ideal instead of trying to go home at current level.  They agree and want to continue pursuing SNF.      Cognition Arousal/Alertness: Awake/alert Behavior During Therapy: WFL for tasks assessed/performed Overall Cognitive Status: Impaired/Different from baseline Area of Impairment: Problem solving, Memory, Safety/judgement, Awareness                   Current Attention Level: Sustained Memory: Decreased short-term memory   Safety/Judgement: Decreased awareness of deficits, Decreased awareness of safety Awareness: Emergent Problem Solving: Requires tactile cues, Requires verbal cues General Comments: Pt needs mod demonstrational cueing for placement of hands with sit to stand transitions.  Decreased awareness of current physical deficits and the challenges of going home at the current level vs SNF.                   Pertinent Vitals/ Pain       Pain Assessment Pain Assessment: Faces Faces Pain Scale: No hurt         Frequency  Min 2X/week        Progress Toward Goals  OT Goals(current goals can now be found in the care plan section)  Progress towards OT goals: Progressing toward goals  Acute Rehab OT Goals Patient Stated Goal: Pt wants to go to rehab OT Goal Formulation: With patient/family Time For Goal Achievement: 06/14/22 Potential to Achieve Goals: Good  Plan Frequency remains  appropriate;Discharge plan needs to be updated       AM-PAC OT "6 Clicks" Daily Activity     Outcome Measure   Help from another person eating meals?: None Help from another person taking care of personal grooming?: A Little Help from another person toileting, which includes using toliet, bedpan, or urinal?: A Lot Help from another person bathing (including washing, rinsing, drying)?: A Lot Help from another person to put on and taking off regular upper body clothing?: A Little Help from another person to put on and taking off regular lower body clothing?: A Lot 6 Click Score: 16    End of Session Equipment Utilized During Treatment: Gait belt;Rolling walker (2 wheels)  OT Visit Diagnosis: Other abnormalities of gait and mobility (R26.89);Muscle weakness (generalized) (M62.81);Pain   Activity Tolerance Patient tolerated treatment well   Patient Left in chair;with call bell/phone within reach;with chair alarm set;with family/visitor present   Nurse Communication Mobility status;Other (comment) (use of [place])        Time: 0920-1017 OT Time Calculation (min): 57 min  Charges: OT General Charges $OT Visit: 1 Visit OT Treatments $Self Care/Home Management : 53-67 mins Clyda Greener, OTR/L  [place]  Office [phone] 06/09/2022

## 2022-06-09 NOTE — TOC Transition Note (Addendum)
Transition of Care [facility]) - CM/SW Discharge Note   Patient Details  Name: Kathryn Beck MRN: [id number] Date of Birth: 1955-11-30  Transition of Care [facility]) CM/SW Contact:  Carles Collet, RN Phone Number: 06/09/2022, 11:02 AM   Clinical Narrative:     Notified [place] liaison of DC. All DME ordered yesterday.  No other TOC needs identified.     Change of plan.   Updated by CSW that patient will go to SNF     Final next level of care: [place] Barriers to Discharge: No Barriers Identified   Patient Goals and CMS Choice   Choice offered to / list presented to : Patient  Discharge Placement                         Discharge Plan and Services Additional resources added to the After Visit Summary for   In-house Referral: Clinical Social Work   Post Acute Care Choice: IP Rehab          DME Arranged: Bedside commode, Youth worker wheelchair with seat cushion DME Agency: [employer] Date DME Agency Contacted: 06/08/22 Time DME Agency Contacted: [phone] Representative spoke with at DME Agency: [place]: [place] Date [place]: 06/09/22 Time [place]: [id number] Representative spoke with at [place]: [place] ([place]) Interventions SDOH Screenings   Food Insecurity: No Food Insecurity (05/30/2022)  Housing: Low Risk  (05/30/2022)  Transportation Needs: No Transportation Needs (05/30/2022)  Utilities: Not At Risk (05/30/2022)  Alcohol Screen: Low Risk  (05/12/2022)  Depression (PHQ2-9): High Risk (05/12/2022)  Financial Resource Strain: Medium Risk (05/12/2022)  Physical Activity: Inactive (05/12/2022)  Social Connections: Socially Integrated (05/12/2022)  Stress: No Stress Concern Present (05/12/2022)  Tobacco Use: Low Risk  (06/04/2022)     Readmission Risk Interventions    05/01/2022    3:54 PM  Readmission Risk Prevention Plan  Transportation Screening Complete  HRI  or [place] Complete  Palliative Care Screening Not Applicable  Medication Review (RN Care Manager) Complete

## 2022-06-09 NOTE — Progress Notes (Signed)
[facility] Progress Note   Subjective:   Patient seen and examined at bedside.  Working with OT.  Denies CP, SOB, abdominal pain and n/v/d.  Husband at bedside and reports he will provide transportation to dialysis.    Objective Vitals:   06/08/22 1627 06/08/22 2023 06/09/22 0408 06/09/22 0756  BP: (!) 123/44 (!) 137/54 (!) 121/41 (!) 138/57  Pulse: 69 67 68 68  Resp: 18 18 18 17   Temp: 98.2 F (36.8 C) 97.8 F (36.6 C) 98.2 F (36.8 C) 98.1 F (36.7 C)  TempSrc: Oral   Oral  SpO2: 97% 99% 96% 99%  Weight:      Height:       Physical Exam General:well appearing female in NAD Heart:RRR, no mrg Lungs:CTAB, nml WOB on RA Abdomen:soft, NTND Extremities:R BKA Dialysis Access: LU AVF   Filed Weights   06/05/22 0810 06/05/22 1157 06/08/22 0745  Weight: 80.7 kg 79 kg 84.2 kg    Intake/Output Summary (Last 24 hours) at 06/09/2022 1023 Last data filed at 06/09/2022 0745 Gross per 24 hour  Intake --  Output 1400 ml  Net -1400 ml    Additional Objective Labs: Basic Metabolic Panel: Recent Labs  Lab 06/05/22 0426 06/07/22 0321 06/08/22 0436  NA 141 141 142  K 4.5 4.5 5.0  CL 102 100 102  CO2 25 25 25   GLUCOSE 228* 207* 134*  BUN 79* 69* 87*  CREATININE 7.73* 7.41* 9.21*  CALCIUM 9.7 9.6 9.6  PHOS 4.3 4.8* 5.9*   Liver Function Tests: Recent Labs  Lab 06/05/22 0426 06/07/22 0321 06/08/22 0436  ALBUMIN 2.6* 2.7* 2.8*    CBC: Recent Labs  Lab 06/03/22 0143 06/05/22 0426 06/08/22 0436  WBC 7.8 8.2 8.5  HGB 8.2* 8.7* 8.7*  HCT 26.1* 29.6* 29.5*  MCV 82.3 84.1 82.9  PLT 267 273 339    CBG: Recent Labs  Lab 06/08/22 0655 06/08/22 1221 06/08/22 1624 06/08/22 2025 06/09/22 0752  GLUCAP 126* 125* 153* 176* 217*    Medications:  ferric gluconate (FERRLECIT) IVPB Stopped (06/05/22 1529)   magnesium sulfate bolus IVPB      (feeding supplement) PROSource Plus  30 mL Oral BID BM   vitamin C  1,000 mg Oral Daily   aspirin  162 mg  Oral Daily   calcitRIOL  0.25 mcg Oral Q M,W,F-HD   Chlorhexidine Gluconate Cloth  6 each Topical Q0600   Chlorhexidine Gluconate Cloth  6 each Topical Q0600   Chlorhexidine Gluconate Cloth  6 each Topical Q0600   clopidogrel  75 mg Oral Q breakfast   darbepoetin (ARANESP) injection - DIALYSIS  100 mcg Subcutaneous Q Mon-1800   docusate sodium  100 mg Oral Daily   DULoxetine  60 mg Oral Daily   ezetimibe  10 mg Oral Daily   feeding supplement (NEPRO CARB STEADY)  237 mL Oral BID BM   furosemide  40 mg Oral BID   insulin aspart  0-9 Units Subcutaneous TID WC   insulin glargine-yfgn  64 Units Subcutaneous Daily   ketotifen  1 drop Both Eyes BID   metoprolol succinate  50 mg Oral QPM   multivitamin  1 tablet Oral QHS   nutrition supplement (JUVEN)  1 packet Oral BID BM   pantoprazole  40 mg Oral Daily   pregabalin  25 mg Oral BID   sertraline  50 mg Oral Daily   sucroferric oxyhydroxide  1,000 mg Oral TID WC   sucroferric oxyhydroxide  500 mg Oral  With snacks   zinc sulfate  220 mg Oral Daily    Dialysis Orders: [facility] MWF 3h 72min   84kg  400/500   LUA AVF  15ga  Heparin 1000 units initial bolus then 1000u/hr, stop 1 hour before end of treatment (3200 units IV here).    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  CIR insurance denial upheld. Plans to d/c home with [place]. ESRD - on HD MWF w/ [facility] [place] [place]. HD today per tomorrow per regular schedule.  HTN/ volume - BP's are okay. On Toprol XL 50mg  qd and furosemide 40mg  qd. Does not appear overloaded, UF as tolerated. Optimize volume with HD. Will need lower EDW post BKA on discharge.  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   Follow trend, transfuse prn. On ESA/Getting Fe load. Aranesp increased to 1103mcg qwk on 3/18.  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 Phos  mildly elevated.  Continue binders.  Not on VDRA. DM2 on insulin.  DM management per primary team  Nutrition - renal diet w/fluid restrictions. Low albumin. Protein supplements and renal vitamin ordered   Disposition: Plan to d/c home with home health.  PT/OT working with patient this morning.  Will need to be able to transfer to and from wheelchair to be able to attend outpatient dialysis.  Husband reports having a wheelchair at home.  Has not worked on transfers d/t expectation to d/c to SNF.    Jen Mow, PA-C [facility] 06/09/2022,10:23 AM  LOS: 11 days

## 2022-06-09 NOTE — Progress Notes (Signed)
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  Pt is referred to PT for mobility and safety education.  PMHx:  PVD, R foot ulcer, OA, anemia, stroke, HD with Lupper arm HD port, PNA, Covid 19, DM, hypoxia, HTN, HA, cardiac cath, back pain, depression    PT Comments    Pt was received in supine and agreeable to session with husband present. Session focused on standing and gait trials. Pt continues to require up to max A +2 to stand from low surface due to LLE weakness. Pt was able to progress gait distance this session with hop-to pattern with up to mod A +2. Pt demonstrated decreased activity tolerance, requiring a seated recovery break during gait trial. Pt continues to benefit from PT services to progress toward functional mobility goals.     Recommendations for follow up therapy are one component of a multi-disciplinary discharge planning process, led by the attending physician.  Recommendations may be updated based on patient status, additional functional criteria and insurance authorization.  Follow Up Recommendations  Acute inpatient rehab (3hours/day)     Assistance Recommended at Discharge Frequent or constant Supervision/Assistance  Patient can return home with the following Assistance with cooking/housework;Assist for transportation;Help with stairs or ramp for entrance;A lot of help with walking and/or transfers   Equipment Recommendations  None recommended by PT    Recommendations for Other Services       Precautions / Restrictions Precautions Precautions: Fall;Other (comment) Precaution Comments: wound vac Required Braces or Orthoses: Other Brace Other Brace: limb protector Restrictions Weight Bearing Restrictions: Yes RLE Weight Bearing: Non weight bearing     Mobility  Bed Mobility                General bed mobility comments: Pt beginning and ending session in recliner    Transfers Overall transfer level: Needs assistance Equipment used: Rolling walker (2 wheels) Transfers: Sit to/from Stand Sit to Stand: Max assist, Mod assist, +2 physical assistance           General transfer comment: Pt able to stand from recliner to RW x2 initially requiring max +2 for power up due to LLE weakness progressing to mod A +2. Pt required cues for safe hand placement. Pt tending to slide forward before being able to stand and RLE noted to be on the floor.    Ambulation/Gait Ambulation/Gait assistance: Mod assist, +2 physical assistance Gait Distance (Feet): 5 Feet (x2) Assistive device: Rolling walker (2 wheels) Gait Pattern/deviations: Step-to pattern, Trunk flexed       General Gait Details: Pt performed hop-to pattern with mod A +2 for balance due to LLE weakness. Pt requiring one seated recovery break due to fatigue       Balance Overall balance assessment: Needs assistance Sitting-balance support: Feet supported, No upper extremity supported Sitting balance-Leahy Scale: Fair Sitting balance - Comments: sitting in recliner   Standing balance support: During functional activity, Bilateral upper extremity supported, Reliant on assistive device for balance Standing balance-Leahy Scale: Poor Standing balance comment: with RW support and mod A +2                            Cognition Arousal/Alertness: Awake/alert Behavior During Therapy: WFL for tasks assessed/performed Overall Cognitive Status: Within Functional Limits for tasks  assessed                                          Exercises      General Comments General comments (skin integrity, edema, etc.): Pt's husband present throughout session      Pertinent Vitals/Pain Pain Assessment Pain Assessment: Faces Pain Score: 0-No pain     PT Goals (current goals can now be  found in the care plan section) Acute Rehab PT Goals Patient Stated Goal: to get to walk again PT Goal Formulation: With patient Time For Goal Achievement: 06/12/22 Potential to Achieve Goals: Good Progress towards PT goals: Progressing toward goals    Frequency    Min 3X/week      PT Plan Current plan remains appropriate       AM-PAC PT "6 Clicks" Mobility   Outcome Measure  Help needed turning from your back to your side while in a flat bed without using bedrails?: A Little Help needed moving from lying on your back to sitting on the side of a flat bed without using bedrails?: A Little Help needed moving to and from a bed to a chair (including a wheelchair)?: Total Help needed standing up from a chair using your arms (e.g., wheelchair or bedside chair)?: Total Help needed to walk in hospital room?: Total Help needed climbing 3-5 steps with a railing? : Total 6 Click Score: 10    End of Session Equipment Utilized During Treatment: Gait belt Activity Tolerance: Patient tolerated treatment well Patient left: in chair;with family/visitor present;with call bell/phone within reach;with chair alarm set Nurse Communication: Mobility status;Need for lift equipment PT Visit Diagnosis: Other abnormalities of gait and mobility (R26.89);Muscle weakness (generalized) (M62.81);Difficulty in walking, not elsewhere classified (R26.2)     Time: [id number] PT Time Calculation (min) (ACUTE ONLY): 16 min  Charges:  $Gait Training: 8-22 mins                     Michelle Nasuti, PTA Acute Rehabilitation Services Secure Chat Preferred  [phone]    Michelle Nasuti 06/09/2022, 2:33 PM

## 2022-06-09 NOTE — Progress Notes (Signed)
Patient ID: Kathryn Beck, female   DOB: 1955/12/02, 67 y.o.   MRN: [id number] Patient is seen in follow-up status post right below-knee amputation.  Appeal for CIR has been denied.  Their preferred skilled nursing facility [facility] does not have a skilled nursing bed offer.  Family wishes to discharge to home at this time.  Will plan for removal of the wound VAC dressing applied dry dressing and discharge to home.

## 2022-06-10 DIAGNOSIS — G894 Chronic pain syndrome: Secondary | ICD-10-CM | POA: Diagnosis not present

## 2022-06-10 DIAGNOSIS — M1990 Unspecified osteoarthritis, unspecified site: Secondary | ICD-10-CM | POA: Insufficient documentation

## 2022-06-10 DIAGNOSIS — N186 End stage renal disease: Secondary | ICD-10-CM | POA: Diagnosis not present

## 2022-06-10 DIAGNOSIS — R531 Weakness: Secondary | ICD-10-CM | POA: Diagnosis not present

## 2022-06-10 DIAGNOSIS — I120 Hypertensive chronic kidney disease with stage 5 chronic kidney disease or end stage renal disease: Secondary | ICD-10-CM | POA: Diagnosis not present

## 2022-06-10 DIAGNOSIS — N2581 Secondary hyperparathyroidism of renal origin: Secondary | ICD-10-CM | POA: Diagnosis not present

## 2022-06-10 DIAGNOSIS — R296 Repeated falls: Secondary | ICD-10-CM | POA: Insufficient documentation

## 2022-06-10 DIAGNOSIS — R2689 Other abnormalities of gait and mobility: Secondary | ICD-10-CM | POA: Diagnosis not present

## 2022-06-10 DIAGNOSIS — E782 Mixed hyperlipidemia: Secondary | ICD-10-CM | POA: Diagnosis not present

## 2022-06-10 DIAGNOSIS — D631 Anemia in chronic kidney disease: Secondary | ICD-10-CM | POA: Diagnosis not present

## 2022-06-10 DIAGNOSIS — Z4781 Encounter for orthopedic aftercare following surgical amputation: Secondary | ICD-10-CM | POA: Diagnosis not present

## 2022-06-10 DIAGNOSIS — I739 Peripheral vascular disease, unspecified: Secondary | ICD-10-CM | POA: Diagnosis not present

## 2022-06-10 DIAGNOSIS — E1140 Type 2 diabetes mellitus with diabetic neuropathy, unspecified: Secondary | ICD-10-CM | POA: Diagnosis not present

## 2022-06-10 DIAGNOSIS — I10 Essential (primary) hypertension: Secondary | ICD-10-CM | POA: Diagnosis not present

## 2022-06-10 DIAGNOSIS — M6281 Muscle weakness (generalized): Secondary | ICD-10-CM | POA: Diagnosis not present

## 2022-06-10 DIAGNOSIS — R41841 Cognitive communication deficit: Secondary | ICD-10-CM | POA: Diagnosis not present

## 2022-06-10 DIAGNOSIS — Z89511 Acquired absence of right leg below knee: Secondary | ICD-10-CM | POA: Diagnosis not present

## 2022-06-10 DIAGNOSIS — Z743 Need for continuous supervision: Secondary | ICD-10-CM | POA: Diagnosis not present

## 2022-06-10 DIAGNOSIS — N250 Renal osteodystrophy: Secondary | ICD-10-CM | POA: Diagnosis not present

## 2022-06-10 DIAGNOSIS — Z992 Dependence on renal dialysis: Secondary | ICD-10-CM | POA: Diagnosis not present

## 2022-06-10 DIAGNOSIS — E1122 Type 2 diabetes mellitus with diabetic chronic kidney disease: Secondary | ICD-10-CM | POA: Diagnosis not present

## 2022-06-10 DIAGNOSIS — I5032 Chronic diastolic (congestive) heart failure: Secondary | ICD-10-CM | POA: Diagnosis not present

## 2022-06-10 DIAGNOSIS — Z8673 Personal history of transient ischemic attack (TIA), and cerebral infarction without residual deficits: Secondary | ICD-10-CM | POA: Insufficient documentation

## 2022-06-10 LAB — RENAL FUNCTION PANEL
Albumin: 2.8 g/dL — ABNORMAL LOW (ref 3.5–5.0)
Anion gap: 17 — ABNORMAL HIGH (ref 5–15)
BUN: 78 mg/dL — ABNORMAL HIGH (ref 8–23)
CO2: 26 mmol/L (ref 22–32)
Calcium: 10.1 mg/dL (ref 8.9–10.3)
Chloride: 98 mmol/L (ref 98–111)
Creatinine, Ser: 8.69 mg/dL — ABNORMAL HIGH (ref 0.44–1.00)
GFR, Estimated: 5 mL/min — ABNORMAL LOW (ref >60)
Glucose, Bld: 122 mg/dL — ABNORMAL HIGH (ref 70–99)
Phosphorus: 6.9 mg/dL — ABNORMAL HIGH (ref 2.5–4.6)
Potassium: 4.7 mmol/L (ref 3.5–5.1)
Sodium: 141 mmol/L (ref 135–145)

## 2022-06-10 LAB — CBC
HCT: 29.2 % — ABNORMAL LOW (ref 36.0–46.0)
Hemoglobin: 9 g/dL — ABNORMAL LOW (ref 12.0–15.0)
MCH: 25.3 pg — ABNORMAL LOW (ref 26.0–34.0)
MCHC: 30.8 g/dL (ref 30.0–36.0)
MCV: 82 fL (ref 80.0–100.0)
Platelets: 311 10*3/uL (ref 150–400)
RBC: 3.56 MIL/uL — ABNORMAL LOW (ref 3.87–5.11)
RDW: 18.6 % — ABNORMAL HIGH (ref 11.5–15.5)
WBC: 8.3 10*3/uL (ref 4.0–10.5)
nRBC: 0.6 % — ABNORMAL HIGH (ref 0.0–0.2)

## 2022-06-10 LAB — GLUCOSE, CAPILLARY
Glucose-Capillary: 69 mg/dL — ABNORMAL LOW (ref 70–99)
Glucose-Capillary: 98 mg/dL (ref 70–99)
Glucose-Capillary: 80 mg/dL (ref 70–99)
Glucose-Capillary: 118 mg/dL — ABNORMAL HIGH (ref 70–99)
Glucose-Capillary: 132 mg/dL — ABNORMAL HIGH (ref 70–99)

## 2022-06-10 MED ORDER — LIDOCAINE-PRILOCAINE 2.5-2.5 % EX CREA: 1.0000 | TOPICAL_CREAM | CUTANEOUS | Status: DC | PRN

## 2022-06-10 MED ORDER — OXYCODONE-ACETAMINOPHEN 5-325 MG PO TABS: 1.0000 | ORAL_TABLET | ORAL | 0 refills | Status: DC | PRN

## 2022-06-10 MED ORDER — HEPARIN SODIUM (PORCINE) 1000 UNIT/ML DIALYSIS
3200.0000 [IU] | INTRAMUSCULAR | Status: DC | PRN
  Administered 2022-06-10: 3200 [IU] via INTRAVENOUS_CENTRAL
  Filled 2022-06-10 (×2): qty 4

## 2022-06-10 MED ORDER — ALBUMIN HUMAN 25 % IV SOLN: 12.5000 g | Freq: Once | INTRAVENOUS | Status: AC

## 2022-06-10 MED ORDER — PREGABALIN 50 MG PO CAPS: 50.0000 mg | ORAL_CAPSULE | Freq: Three times a day (TID) | ORAL | 2 refills | Status: DC

## 2022-06-10 MED ORDER — LIDOCAINE HCL (PF) 1 % IJ SOLN: 5.0000 mL | INTRAMUSCULAR | Status: DC | PRN

## 2022-06-10 MED ORDER — PENTAFLUOROPROP-TETRAFLUOROETH EX AERO: 1.0000 | INHALATION_SPRAY | CUTANEOUS | Status: DC | PRN

## 2022-06-10 MED ORDER — ALBUMIN HUMAN 25 % IV SOLN
INTRAVENOUS | Status: AC
  Administered 2022-06-10: 12.5 g via INTRAVENOUS
  Filled 2022-06-10: qty 100

## 2022-06-10 NOTE — TOC Transition Note (Signed)
Transition of Care [facility]) - CM/SW Discharge Note   Patient Details  Name: Kathryn Beck MRN: [id number] Date of Birth: 04-May-1955  [facility]) CM/SW Contact:  Joanne Chars, LCSW Phone Number: 06/10/2022, 2:31 PM   Clinical Narrative:   Pt discharging to [facility].  RN call report to [phone].   1000: CSW confirmed with Dest[facility] that she can accept pt today.   Final next level of care: Skilled Nursing Facility Barriers to Discharge: Barriers Resolved   Patient Goals and CMS Choice   Choice offered to / list presented to : Patient  Discharge Placement                Patient chooses bed at:  [facility]) Patient to be transferred to facility by: [place] Name of family member notified: daughter [name] Patient and family notified of of transfer: 06/10/22  Discharge Plan and Services Additional resources added to the After Visit Summary for   In-house Referral: Clinical Social Work   Post Acute Care Choice: IP Rehab          DME Arranged: Engineer, civil (consulting), Youth worker wheelchair with seat cushion DME Agency: [employer] Date DME Agency Contacted: 06/08/22 Time DME Agency Contacted: [id number] Representative spoke with at DME Agency: [place]: [place] Date [place]: 06/09/22 Time [place]: 85 Representative spoke with at [place]: [place] Determinants of Health ([place]) Interventions SDOH Screenings   Food Insecurity: No Food Insecurity (05/30/2022)  Housing: Low Risk  (05/30/2022)  Transportation Needs: No Transportation Needs (05/30/2022)  Utilities: Not At Risk (05/30/2022)  Alcohol Screen: Low Risk  (05/12/2022)  Depression (PHQ2-9): High Risk (05/12/2022)  Financial Resource Strain: Medium Risk (05/12/2022)  Physical Activity: Inactive (05/12/2022)  Social Connections: Socially Integrated (05/12/2022)  Stress: No Stress Concern Present (05/12/2022)  Tobacco Use: Low Risk   (06/09/2022)     Readmission Risk Interventions    05/01/2022    3:54 PM  Readmission Risk Prevention Plan  Transportation Screening Complete  HRI or [place] Complete  Palliative Care Screening Not Applicable  Medication Review (RN Care Manager) Complete

## 2022-06-10 NOTE — Progress Notes (Signed)
Report given to [facility].

## 2022-06-10 NOTE — Progress Notes (Signed)
[facility] Progress Note   Subjective:   Patient seen and examined at bedside in dialysis.  Tolerating treatment well so far.  Denies CP, SOB, abdominal pain and n/v/d.   Objective Vitals:   06/10/22 1017 06/10/22 1030 06/10/22 1043 06/10/22 1045  BP: (!) 94/46 (!) 92/45 (!) 80/45 (!) 83/45  Pulse: (!) 59 60 60 60  Resp: 12 14 12 12   Temp:      TempSrc:      SpO2: 98% 99% 98% 98%  Weight:      Height:       Physical Exam General: well appearing female in NAD Heart:RRR, no mrg Lungs:CTAB, nml WOB on RA Abdomen:soft, NTND Extremities:no LE edema, R BKA Dialysis Access: LU AVF in use   Filed Weights   06/05/22 1157 06/08/22 0745 06/10/22 0813  Weight: 79 kg 84.2 kg 80.2 kg   No intake or output data in the 24 hours ending 06/10/22 1049  Additional Objective Labs: Basic Metabolic Panel: Recent Labs  Lab 06/07/22 0321 06/08/22 0436 06/10/22 0830  NA 141 142 141  K 4.5 5.0 4.7  CL 100 102 98  CO2 25 25 26   GLUCOSE 207* 134* 122*  BUN 69* 87* 78*  CREATININE 7.41* 9.21* 8.69*  CALCIUM 9.6 9.6 10.1  PHOS 4.8* 5.9* 6.9*   Liver Function Tests: Recent Labs  Lab 06/07/22 0321 06/08/22 0436 06/10/22 0830  ALBUMIN 2.7* 2.8* 2.8*    Recent Labs  Lab 06/05/22 0426 06/08/22 0436 06/10/22 0830  WBC 8.2 8.5 8.3  HGB 8.7* 8.7* 9.0*  HCT 29.6* 29.5* 29.2*  MCV 84.1 82.9 82.0  PLT 273 339 311    CBG: Recent Labs  Lab 06/09/22 1123 06/09/22 1619 06/09/22 1945 06/10/22 0704 06/10/22 0747  GLUCAP 176* 142* 113* 69* 98    Medications:  ferric gluconate (FERRLECIT) IVPB Stopped (06/05/22 1529)   magnesium sulfate bolus IVPB      (feeding supplement) PROSource Plus  30 mL Oral BID BM   vitamin C  1,000 mg Oral Daily   aspirin  162 mg Oral Daily   calcitRIOL  0.25 mcg Oral Q M,W,F-HD   Chlorhexidine Gluconate Cloth  6 each Topical Q0600   clopidogrel  75 mg Oral Q breakfast   darbepoetin (ARANESP) injection - DIALYSIS  100 mcg  Subcutaneous Q Mon-1800   docusate sodium  100 mg Oral Daily   DULoxetine  60 mg Oral Daily   ezetimibe  10 mg Oral Daily   feeding supplement (NEPRO CARB STEADY)  237 mL Oral BID BM   furosemide  40 mg Oral BID   insulin aspart  0-9 Units Subcutaneous TID WC   insulin glargine-yfgn  64 Units Subcutaneous Daily   ketotifen  1 drop Both Eyes BID   metoprolol succinate  50 mg Oral QPM   multivitamin  1 tablet Oral QHS   nutrition supplement (JUVEN)  1 packet Oral BID BM   pantoprazole  40 mg Oral Daily   pregabalin  25 mg Oral BID   sertraline  50 mg Oral Daily   sucroferric oxyhydroxide  1,000 mg Oral TID WC   sucroferric oxyhydroxide  500 mg Oral With snacks   zinc sulfate  220 mg Oral Daily    Dialysis Orders: [facility] MWF 3h 44min   84kg  400/500   LUA AVF  15ga  Heparin 1000 units initial bolus then 1000u/hr, stop 1 hour before end of treatment (3200 units IV here).    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  CIR insurance denial upheld. ESRD - on HD MWF w/ [facility] [place]. HD today per regular schedule.  HTN/ volume - BP's are okay. On Toprol XL 50mg  qd and furosemide 40mg  qd. Does not appear overloaded, UF as tolerated. Optimize volume with HD. Will need lower EDW post BKA on discharge.  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   Follow trend, transfuse prn. On ESA/Getting Fe load. Aranesp increased to 116mcg qwk on 3/18.  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 Phos mildly elevated.  Continue binders.  Not on VDRA. DM2 on insulin.  DM management per primary team  Nutrition - renal diet w/fluid restrictions. Low albumin. Protein supplements and renal vitamin ordered   Disposition: Plan to d/c to SNF  Jen Mow, PA-C [facility] 06/10/2022,10:49 AM  LOS: 12 days

## 2022-06-10 NOTE — Plan of Care (Signed)

## 2022-06-10 NOTE — Progress Notes (Signed)
POST HD TX NOTE  06/10/22 1224  Vitals  Temp 97.6 F (36.4 C)  Temp Source Oral  BP (!) 121/47  MAP (mmHg) 67  BP Location Right Arm  BP Method Automatic  Patient Position (if appropriate) Lying  Pulse Rate (!) 57  Pulse Rate Source Monitor  ECG Heart Rate 63  Resp 13  Oxygen Therapy  SpO2 98 %  O2 Device Room Air  Pulse Oximetry Type Continuous  During Treatment Monitoring  Intra-Hemodialysis Comments (S)   (post HD tx VS check)  Post Treatment  Dialyzer Clearance Clear  Duration of HD Treatment -hour(s) 3.5 hour(s)  Hemodialysis Intake (mL) 400 mL (369ml NS boluses and 137ml Albumin)  Liters Processed 84  Fluid Removed (mL) 1500 mL (1959ml (value from machine) - 382ml NS boluses -158ml albumin = 1548ml)  Tolerated HD Treatment No (Comment)  Post-Hemodialysis Comments (S)  tx completed w/bp issues throughout tx that required NS boluses, UF off, and albumin admin to resolve, UF goal not met, blood rinsed back, VSS. Medication Admin: Heparin 3200 units bolus, Albumin 25% 25G IVPB  Fistula / Graft Left Upper arm Arteriovenous fistula  Placement Date/Time: 09/02/17 1150   Placed prior to admission: Yes  Orientation: Left  Access Location: Upper arm  Access Type: Arteriovenous fistula  Site Condition No complications  Fistula / Graft Assessment Bruit;Thrill;Present  Drainage Description None

## 2022-06-10 NOTE — Discharge Summary (Signed)
Discharge Diagnoses:  Principal Problem:   S/P BKA (below knee amputation), right ([place]) Active Problems:   Gangrene of right foot ([place])   Surgeries: Procedure(s): RIGHT BELOW THE KNEE AMPUTATION on 05/29/2022    Consultants: Treatment Team:  Roney Jaffe, MD  Discharged Condition: Improved  Hospital Course: Kathryn Beck is an 67 y.o. female who was admitted 05/29/2022 with a chief complaint of gangrene right foot, with a final diagnosis of Gangrene Right Foot.  Patient was brought to the operating room on 05/29/2022 and underwent Procedure(s): RIGHT BELOW THE KNEE AMPUTATION.    Patient was given perioperative antibiotics:  Anti-infectives (From admission, onward)    Start     Dose/Rate Route Frequency Ordered Stop   05/30/22 1000  ceFAZolin (ANCEF) IVPB 1 g/50 mL premix  Status:  Discontinued        1 g 100 mL/hr over 30 Minutes Intravenous Every 24 hours 05/29/22 1257 05/31/22 1027   05/29/22 0700  ceFAZolin (ANCEF) IVPB 2g/100 mL premix        2 g 200 mL/hr over 30 Minutes Intravenous On call to O.R. 05/29/22 [id number] 05/29/22 1014     .  Patient was given sequential compression devices, early ambulation, and aspirin for DVT prophylaxis.  Recent vital signs: Patient Vitals for the past 24 hrs:  BP Temp Temp src Pulse Resp SpO2  06/10/22 0435 (!) 150/59 98.4 F (36.9 C) -- 71 18 94 %  06/09/22 1944 (!) 137/52 98.6 F (37 C) -- 76 17 96 %  06/09/22 1446 (!) 136/56 98.1 F (36.7 C) Oral 70 16 96 %  06/09/22 0756 (!) 138/57 98.1 F (36.7 C) Oral 68 17 99 %  .  Recent laboratory studies: No results found.  Discharge Medications:   Allergies as of 06/10/2022       Reactions   Ace Inhibitors Anaphylaxis, Swelling   Penicillins Itching, Swelling, Other (See Comments)   Did it involve swelling of the face/tongue/throat, SOB, or low BP? Unknown Did it involve sudden or severe rash/hives, skin peeling, or any reaction on the inside of your mouth or nose? Unknown Did you  need to seek medical attention at a hospital or doctor's office? Unknown When did it last happen?      years  If all above answers are "NO", may proceed with cephalosporin use.   Statins Other (See Comments)   elevated LFT's   Albuterol Swelling        Medication List     TAKE these medications    aspirin 81 MG chewable tablet CHEW 2 TABLETS BY MOUTH ONCE DAILY What changed: See the new instructions.   azelastine 0.05 % ophthalmic solution Commonly known as: OPTIVAR Place 1 drop into both eyes 2 (two) times daily.   calcitRIOL 0.25 MCG capsule Commonly known as: ROCALTROL TAKE 1 CAPSULE (0.25 MCG TOTAL) BY MOUTH 2 (TWO) TIMES DAILY WITH A MEAL.   clopidogrel 75 MG tablet Commonly known as: PLAVIX Take 1 tablet (75 mg total) by mouth daily with breakfast.   DULoxetine 60 MG capsule Commonly known as: CYMBALTA TAKE (1) CAPSULE BY MOUTH ONCE DAILY. What changed: See the new instructions.   ezetimibe 10 MG tablet Commonly known as: ZETIA Take 1 tablet (10 mg total) by mouth daily.   Fiasp FlexTouch 100 UNIT/ML FlexTouch Pen Generic drug: insulin aspart Inject 18-30 Units into the skin with breakfast, with lunch, and with evening meal. What changed: additional instructions   FreeStyle Libre 2 Reader Brady 1 each  by Does not apply route daily.   FreeStyle Libre 2 Sensor Misc 1 each by Does not apply route every 14 (fourteen) days.   furosemide 40 MG tablet Commonly known as: LASIX Take 40 mg by mouth 2 (two) times daily.   hydrOXYzine 25 MG tablet Commonly known as: ATARAX Take 25 mg by mouth every 12 (twelve) hours as needed for itching.   Insulin Pen Needle 32G X 4 MM Misc Use 4x a day   metoprolol succinate 50 MG 24 hr tablet Commonly known as: TOPROL-XL TAKE (1) TABLET BY MOUTH DAILY WITH FOOD *TAKE AFTER DIALYSIS* What changed:  how much to take how to take this when to take this additional instructions   OneTouch Delica Lancets 99991111 Misc Use to  check blood sugar 4X daily.   OneTouch Verio test strip Generic drug: glucose blood Use as instructed to check blood sugar 4X daily.   OneTouch Verio w/Device Kit Use to check blood sugar 4X daily.   oxyCODONE-acetaminophen 5-325 MG tablet Commonly known as: PERCOCET/ROXICET Take 1 tablet by mouth every 4 (four) hours as needed.   pregabalin 50 MG capsule Commonly known as: LYRICA TAKE ONE CAPSULE BY MOUTH three times a day What changed:  how much to take how to take this when to take this additional instructions   Rena-Vite Rx 1 MG Tabs Take 1 tablet by mouth daily.   sertraline 50 MG tablet Commonly known as: ZOLOFT TAKE ONE TABLET BY MOUTH EVERY DAY   Toujeo Max SoloStar 300 UNIT/ML Solostar Pen Generic drug: insulin glargine (2 Unit Dial) Inject 80 Units into the skin daily.   [name] 500 MG chewable tablet Generic drug: sucroferric oxyhydroxide Chew 500-1,000 mg by mouth See admin instructions. Take 1000mg  (2 tablets) by mouth with meals and 500mg  (1 tablet) with snacks               Durable Medical Equipment  (From admission, onward)           Start     Ordered   06/08/22 1333  For home use only DME Bedside commode  Once       Question:  Patient needs a bedside commode to treat with the following condition  Answer:  S/P BKA (below knee amputation), right ([place])   06/08/22 1333   06/08/22 1304  For home use only DME lightweight manual wheelchair with seat cushion  Once       Comments: Patient suffers from bka which impairs their ability to perform daily activities like bathing, dressing in the home.  A walker will not resolve  issue with performing activities of daily living. A wheelchair will allow patient to safely perform daily activities. Patient is not able to propel themselves in the home using a standard weight wheelchair due to general weakness. Patient can self propel in the lightweight wheelchair. Length of need Lifetime. Accessories:  elevating leg rests (ELRs), wheel locks, extensions and anti-tippers.   06/08/22 1305            Diagnostic Studies: VAS US CAROTID  Result Date: 05/29/2022 Carotid Arterial Duplex Study Patient Name:  Kathryn Beck  Date of Exam:   05/28/2022 Medical Rec #: [id number]        Accession #:    [id number] Date of Birth: [birth year]        Patient Gender: F Patient Age:   72 years Exam Location:  [facility] Procedure:      VAS US CAROTID Referring Phys: Laurann Montana --------------------------------------------------------------------------------  Indications:       Carotid artery disease and patient denies any cerebrovascular                    symptoms. Risk Factors:      Hypertension, hyperlipidemia, Diabetes, no history of                    smoking. Comparison Study:  05/09/2015 RICA [id number] and LICA [id number] cm/sec. Performing Technologist: Leavy Cella RDCS  Examination Guidelines: A complete evaluation includes B-mode imaging, spectral Doppler, color Doppler, and power Doppler as needed of all accessible portions of each vessel. Bilateral testing is considered an integral part of a complete examination. Limited examinations for reoccurring indications may be performed as noted.  Right Carotid Findings: +----------+--------+--------+--------+------------------+--------+           PSV cm/sEDV cm/sStenosisPlaque DescriptionComments +----------+--------+--------+--------+------------------+--------+ CCA Prox  101     13                                         +----------+--------+--------+--------+------------------+--------+ CCA Mid   93      9                                          +----------+--------+--------+--------+------------------+--------+ CCA Distal93      13              heterogenous               +----------+--------+--------+--------+------------------+--------+ ICA Prox  64      15              smooth                      +----------+--------+--------+--------+------------------+--------+ ICA Mid   114     20                                         +----------+--------+--------+--------+------------------+--------+ ICA Distal117     16                                         +----------+--------+--------+--------+------------------+--------+ ECA       99      11                                         +----------+--------+--------+--------+------------------+--------+ +----------+--------+-------+----------------+-------------------+           PSV cm/sEDV cmsDescribe        Arm Pressure (mmHG) +----------+--------+-------+----------------+-------------------+ [id number]     2      Multiphasic, [id number]                 +----------+--------+-------+----------------+-------------------+ +---------+--------+--+--------+-+---------+ VertebralPSV cm/s69EDV cm/s7Antegrade +---------+--------+--+--------+-+---------+  Left Carotid Findings: +----------+--------+--------+--------+------------------+--------+           PSV cm/sEDV cm/sStenosisPlaque DescriptionComments +----------+--------+--------+--------+------------------+--------+ CCA Prox  67      13                                         +----------+--------+--------+--------+------------------+--------+  CCA Mid   85      12                                         +----------+--------+--------+--------+------------------+--------+ CCA Distal82      15                                         +----------+--------+--------+--------+------------------+--------+ ICA Prox  58      18              heterogenous               +----------+--------+--------+--------+------------------+--------+ ICA Mid   68      17                                         +----------+--------+--------+--------+------------------+--------+ ICA Distal65      14                                          +----------+--------+--------+--------+------------------+--------+ ECA       87      3                                          +----------+--------+--------+--------+------------------+--------+ +----------+--------+--------+-----------------------------+-------------------+           PSV cm/sEDV cm/sDescribe                     Arm Pressure (mmHG) +----------+--------+--------+-----------------------------+-------------------+ Subclavian203     73      Increased EDV and turbulent  255                                           flow consistent with known                                                 arteriovenous fistula                                                      placement                                        +----------+--------+--------+-----------------------------+-------------------+ +---------+--------+--+--------+--+---------+ VertebralPSV cm/s86EDV cm/s12Antegrade +---------+--------+--+--------+--+---------+ Unable to complete left blood pressure due to diaylsis graft.  Summary: Right Carotid: The extracranial vessels were near-normal with only minimal wall                thickening or plaque. Left Carotid: The extracranial vessels were near-normal with only minimal wall  thickening or plaque. Increased EDV and turbulent flow in               subclavian consistent with known arteriovenous fistula placement. Vertebrals:  Bilateral vertebral arteries demonstrate antegrade flow. Subclavians: Left subclavian artery flow was disturbed. Normal flow hemodynamics              were seen in the right subclavian artery. *See table(s) above for measurements and observations.  Electronically signed by Kathlyn Sacramento MD on 05/29/2022 at 11:45:45 AM.    Final    ECHOCARDIOGRAM COMPLETE  Result Date: 05/28/2022    ECHOCARDIOGRAM REPORT   Patient Name:   JOLLENE RAWL Date of Exam: 05/28/2022 Medical Rec #:  [id number]       Height:       64.0 in  Accession #:    [id number]      Weight:       191.0 lb Date of Birth:  Aug 27, 1955       BSA:          1.918 m Patient Age:    13 years        BP:           178/69 mmHg Patient Gender: F               HR:           86 bpm. Exam Location:  Outpatient Procedure: 2D Echo, 3D Echo, Cardiac Doppler, Color Doppler and Strain Analysis Indications:    R01.1 Murmur  History:        Patient has prior history of Echocardiogram examinations, most                 recent 01/17/2019. Signs/Symptoms:Murmur and Syncope; Risk                 Factors:Diabetes, Hypertension, Dyslipidemia and Non-Smoker.                 ESRD.  Sonographer:    Leavy Cella RDCS Referring Phys: [id number] [place]  1. Left ventricular ejection fraction, by estimation, is 60 to 65%. The left ventricle has normal function. The left ventricle has no regional wall motion abnormalities. There is moderate concentric left ventricular hypertrophy. Left ventricular diastolic parameters are consistent with Grade II diastolic dysfunction (pseudonormalization). Elevated left ventricular end-diastolic pressure.  2. Right ventricular systolic function is normal. The right ventricular size is mildly enlarged. There is normal pulmonary artery systolic pressure.  3. Left atrial size was moderately dilated.  4. Right atrial size was mildly dilated.  5. The mitral valve is grossly normal. Trivial mitral valve regurgitation. The mean mitral valve gradient is 4.0 mmHg. Moderate mitral annular calcification.  6. The aortic valve is tricuspid. There is moderate calcification of the aortic valve. There is mild thickening of the aortic valve. Aortic valve regurgitation is trivial. Mild aortic valve stenosis.  7. The inferior vena cava is normal in size with greater than 50% respiratory variability, suggesting right atrial pressure of 3 mmHg. Comparison(s): No significant change from prior study. Conclusion(s)/Recommendation(s): Otherwise normal echocardiogram,  with minor abnormalities described in the report. FINDINGS  Left Ventricle: Left ventricular ejection fraction, by estimation, is 60 to 65%. The left ventricle has normal function. The left ventricle has no regional wall motion abnormalities. The left ventricular internal cavity size was normal in size. There is  moderate concentric left ventricular hypertrophy. Left ventricular diastolic parameters are consistent with Grade II diastolic dysfunction (pseudonormalization). Elevated  left ventricular end-diastolic pressure. Right Ventricle: The right ventricular size is mildly enlarged. Right vetricular wall thickness was not well visualized. Right ventricular systolic function is normal. There is normal pulmonary artery systolic pressure. The tricuspid regurgitant velocity  is 1.57 m/s, and with an assumed right atrial pressure of 3 mmHg, the estimated right ventricular systolic pressure is 0000000 mmHg. Left Atrium: Left atrial size was moderately dilated. Right Atrium: Right atrial size was mildly dilated. Pericardium: There is no evidence of pericardial effusion. Mitral Valve: The mitral valve is grossly normal. There is mild thickening of the mitral valve leaflet(s). There is mild calcification of the mitral valve leaflet(s). Moderate mitral annular calcification. Trivial mitral valve regurgitation. MV peak gradient, 9.2 mmHg. The mean mitral valve gradient is 4.0 mmHg. Tricuspid Valve: The tricuspid valve is normal in structure. Tricuspid valve regurgitation is trivial. No evidence of tricuspid stenosis. Aortic Valve: The aortic valve is tricuspid. There is moderate calcification of the aortic valve. There is mild thickening of the aortic valve. Aortic valve regurgitation is trivial. Mild aortic stenosis is present. Aortic valve mean gradient measures 13.0 mmHg. Aortic valve peak gradient measures 21.2 mmHg. Aortic valve area, by VTI measures 1.68 cm. Pulmonic Valve: The pulmonic valve was grossly normal. Pulmonic  valve regurgitation is trivial. No evidence of pulmonic stenosis. Aorta: The aortic root, ascending aorta, aortic arch and descending aorta are all structurally normal, with no evidence of dilitation or obstruction. Venous: The inferior vena cava is normal in size with greater than 50% respiratory variability, suggesting right atrial pressure of 3 mmHg. IAS/Shunts: The atrial septum is grossly normal.  LEFT VENTRICLE PLAX 2D LVIDd:         4.90 cm   Diastology LVIDs:         2.37 cm   LV e' medial:    4.79 cm/s LV PW:         1.57 cm   LV E/e' medial:  25.9 LV IVS:        1.52 cm   LV e' lateral:   6.64 cm/s LVOT diam:     2.10 cm   LV E/e' lateral: 18.7 LV SV:         76 LV SV Index:   40 LVOT Area:     3.46 cm                           3D Volume EF:                          3D EF:        61 %                          LV EDV:       210 ml                          LV ESV:       83 ml                          LV SV:        127 ml RIGHT VENTRICLE RV Basal diam:  4.88 cm RV Mid diam:    2.91 cm RV S prime:     18.50 cm/s TAPSE (M-mode): 4.0 cm LEFT ATRIUM  Index        RIGHT ATRIUM           Index LA diam:        5.20 cm 2.71 cm/m   RA Area:     18.60 cm LA Vol (A2C):   99.6 ml 51.92 ml/m  RA Volume:   56.50 ml  29.45 ml/m LA Vol (A4C):   56.6 ml 29.50 ml/m LA Biplane Vol: 76.9 ml 40.08 ml/m  AORTIC VALVE AV Area (Vmax):    1.76 cm AV Area (Vmean):   1.47 cm AV Area (VTI):     1.68 cm AV Vmax:           230.00 cm/s AV Vmean:          176.000 cm/s AV VTI:            0.454 m AV Peak Grad:      21.2 mmHg AV Mean Grad:      13.0 mmHg LVOT Vmax:         117.00 cm/s LVOT Vmean:        74.500 cm/s LVOT VTI:          0.220 m LVOT/AV VTI ratio: 0.48  AORTA Ao Root diam: 3.20 cm Ao Asc diam:  3.50 cm MITRAL VALVE                TRICUSPID VALVE MV Area (PHT): 5.13 cm     TR Peak grad:   9.9 mmHg MV Area VTI:   2.09 cm     TR Vmax:        157.00 cm/s MV Peak grad:  9.2 mmHg MV Mean grad:  4.0 mmHg     SHUNTS  MV Vmax:       1.52 m/s     Systemic VTI:  0.22 m MV Vmean:      92.2 cm/s    Systemic Diam: 2.10 cm MV Decel Time: 148 msec MR Peak grad: 82.8 mmHg MR Vmax:      455.00 cm/s MV E velocity: 124.00 cm/s MV A velocity: 129.00 cm/s MV E/A ratio:  0.96 Buford Dresser MD Electronically signed by Buford Dresser MD Signature Date/Time: 05/28/2022/3:26:17 PM    Final     Patient benefited maximally from their hospital stay and there were no complications.     Disposition: Discharge disposition: [facility] Facility      Discharge Instructions     Call MD / Call 911   Complete by: As directed    If you experience chest pain or shortness of breath, CALL 911 and be transported to the hospital emergency room.  If you develope a fever above 101 F, pus (white drainage) or increased drainage or redness at the wound, or calf pain, call your surgeon's office.   Call MD / Call 911   Complete by: As directed    If you experience chest pain or shortness of breath, CALL 911 and be transported to the hospital emergency room.  If you develope a fever above 101 F, pus (white drainage) or increased drainage or redness at the wound, or calf pain, call your surgeon's office.   Call MD / Call 911   Complete by: As directed    If you experience chest pain or shortness of breath, CALL 911 and be transported to the hospital emergency room.  If you develope a fever above 101 F, pus (white drainage) or increased drainage or redness at the wound, or calf pain, call your  surgeon's office.   Constipation Prevention   Complete by: As directed    Drink plenty of fluids.  Prune juice may be helpful.  You may use a stool softener, such as Colace (over the counter) 100 mg twice a day.  Use MiraLax (over the counter) for constipation as needed.   Constipation Prevention   Complete by: As directed    Drink plenty of fluids.  Prune juice may be helpful.  You may use a stool softener, such as Colace (over the counter) 100 mg  twice a day.  Use MiraLax (over the counter) for constipation as needed.   Constipation Prevention   Complete by: As directed    Drink plenty of fluids.  Prune juice may be helpful.  You may use a stool softener, such as Colace (over the counter) 100 mg twice a day.  Use MiraLax (over the counter) for constipation as needed.   Diet - low sodium heart healthy   Complete by: As directed    Diet - low sodium heart healthy   Complete by: As directed    Diet - low sodium heart healthy   Complete by: As directed    Increase activity slowly as tolerated   Complete by: As directed    Increase activity slowly as tolerated   Complete by: As directed    Increase activity slowly as tolerated   Complete by: As directed    Post-operative opioid taper instructions:   Complete by: As directed    POST-OPERATIVE OPIOID TAPER INSTRUCTIONS: It is important to wean off of your opioid medication as soon as possible. If you do not need pain medication after your surgery it is ok to stop day one. Opioids include: Codeine, Hydrocodone(Norco, Vicodin), Oxycodone(Percocet, oxycontin) and hydromorphone amongst others.  Long term and even short term use of opiods can cause: Increased pain response Dependence Constipation Depression Respiratory depression And more.  Withdrawal symptoms can include Flu like symptoms Nausea, vomiting And more Techniques to manage these symptoms Hydrate well Eat regular healthy meals Stay active Use relaxation techniques(deep breathing, meditating, yoga) Do Not substitute Alcohol to help with tapering If you have been on opioids for less than two weeks and do not have pain than it is ok to stop all together.  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 Maintain the same interval or time between taking each dose and first decrease the dose.  Cut the total daily intake of opioids by one tablet each day Next start to  increase the time between doses. The last dose that should be eliminated is the evening dose.      Post-operative opioid taper instructions:   Complete by: As directed    POST-OPERATIVE OPIOID TAPER INSTRUCTIONS: It is important to wean off of your opioid medication as soon as possible. If you do not need pain medication after your surgery it is ok to stop day one. Opioids include: Codeine, Hydrocodone(Norco, Vicodin), Oxycodone(Percocet, oxycontin) and hydromorphone amongst others.  Long term and even short term use of opiods can cause: Increased pain response Dependence Constipation Depression Respiratory depression And more.  Withdrawal symptoms can include Flu like symptoms Nausea, vomiting And more Techniques to manage these symptoms Hydrate well Eat regular healthy meals Stay active Use relaxation techniques(deep breathing, meditating, yoga) Do Not substitute Alcohol to help with tapering If you have been on opioids for less than two weeks and do not have pain than it  is ok to stop all together.  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 Maintain the same interval or time between taking each dose and first decrease the dose.  Cut the total daily intake of opioids by one tablet each day Next start to increase the time between doses. The last dose that should be eliminated is the evening dose.      Post-operative opioid taper instructions:   Complete by: As directed    POST-OPERATIVE OPIOID TAPER INSTRUCTIONS: It is important to wean off of your opioid medication as soon as possible. If you do not need pain medication after your surgery it is ok to stop day one. Opioids include: Codeine, Hydrocodone(Norco, Vicodin), Oxycodone(Percocet, oxycontin) and hydromorphone amongst others.  Long term and even short term use of opiods can cause: Increased pain response Dependence Constipation Depression Respiratory  depression And more.  Withdrawal symptoms can include Flu like symptoms Nausea, vomiting And more Techniques to manage these symptoms Hydrate well Eat regular healthy meals Stay active Use relaxation techniques(deep breathing, meditating, yoga) Do Not substitute Alcohol to help with tapering If you have been on opioids for less than two weeks and do not have pain than it is ok to stop all together.  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 Maintain the same interval or time between taking each dose and first decrease the dose.  Cut the total daily intake of opioids by one tablet each day Next start to increase the time between doses. The last dose that should be eliminated is the evening dose.          Contact information for follow-up providers     [name], MD Follow up in 1 week(s).   Specialty: Orthopedic Surgery Contact information: [place] [address] [phone]              Contact information for after-discharge care     Destination     [facility] .   Service: Skilled Nursing Contact information: [address] [place] [phone]                      [email or web address]: [name] 06/10/2022, 7:04 AM

## 2022-06-10 NOTE — Discharge Summary (Signed)
Discharge Diagnoses:  Principal Problem:   S/P BKA (below knee amputation), right ([place]) Active Problems:   Gangrene of right foot ([place])   Surgeries: Procedure(s): RIGHT BELOW THE KNEE AMPUTATION on 05/29/2022    Consultants: Treatment Team:  Roney Jaffe, MD  Discharged Condition: Improved  Hospital Course: Kathryn Beck is an 67 y.o. female who was admitted 05/29/2022 with a chief complaint of gangrene right foot, with a final diagnosis of Gangrene Right Foot.  Patient was brought to the operating room on 05/29/2022 and underwent Procedure(s): RIGHT BELOW THE KNEE AMPUTATION.    Patient was given perioperative antibiotics:  Anti-infectives (From admission, onward)    Start     Dose/Rate Route Frequency Ordered Stop   05/30/22 1000  ceFAZolin (ANCEF) IVPB 1 g/50 mL premix  Status:  Discontinued        1 g 100 mL/hr over 30 Minutes Intravenous Every 24 hours 05/29/22 1257 05/31/22 1027   05/29/22 0700  ceFAZolin (ANCEF) IVPB 2g/100 mL premix        2 g 200 mL/hr over 30 Minutes Intravenous On call to O.R. 05/29/22 [id number] 05/29/22 1014     .  Patient was given sequential compression devices, early ambulation, and aspirin for DVT prophylaxis.  Recent vital signs: Patient Vitals for the past 24 hrs:  BP Temp Temp src Pulse Resp SpO2  06/10/22 0435 (!) 150/59 98.4 F (36.9 C) -- 71 18 94 %  06/09/22 1944 (!) 137/52 98.6 F (37 C) -- 76 17 96 %  06/09/22 1446 (!) 136/56 98.1 F (36.7 C) Oral 70 16 96 %  06/09/22 0756 (!) 138/57 98.1 F (36.7 C) Oral 68 17 99 %  .  Recent laboratory studies: No results found.  Discharge Medications:   Allergies as of 06/10/2022       Reactions   Ace Inhibitors Anaphylaxis, Swelling   Penicillins Itching, Swelling, Other (See Comments)   Did it involve swelling of the face/tongue/throat, SOB, or low BP? Unknown Did it involve sudden or severe rash/hives, skin peeling, or any reaction on the inside of your mouth or nose? Unknown Did you  need to seek medical attention at a hospital or doctor's office? Unknown When did it last happen?      years  If all above answers are "NO", may proceed with cephalosporin use.   Statins Other (See Comments)   elevated LFT's   Albuterol Swelling        Medication List     TAKE these medications    aspirin 81 MG chewable tablet CHEW 2 TABLETS BY MOUTH ONCE DAILY What changed: See the new instructions.   azelastine 0.05 % ophthalmic solution Commonly known as: OPTIVAR Place 1 drop into both eyes 2 (two) times daily.   calcitRIOL 0.25 MCG capsule Commonly known as: ROCALTROL TAKE 1 CAPSULE (0.25 MCG TOTAL) BY MOUTH 2 (TWO) TIMES DAILY WITH A MEAL.   clopidogrel 75 MG tablet Commonly known as: PLAVIX Take 1 tablet (75 mg total) by mouth daily with breakfast.   DULoxetine 60 MG capsule Commonly known as: CYMBALTA TAKE (1) CAPSULE BY MOUTH ONCE DAILY. What changed: See the new instructions.   ezetimibe 10 MG tablet Commonly known as: ZETIA Take 1 tablet (10 mg total) by mouth daily.   Fiasp FlexTouch 100 UNIT/ML FlexTouch Pen Generic drug: insulin aspart Inject 18-30 Units into the skin with breakfast, with lunch, and with evening meal. What changed: additional instructions   FreeStyle Libre 2 Reader [place] 1 each  by Does not apply route daily.   FreeStyle Libre 2 Sensor Misc 1 each by Does not apply route every 14 (fourteen) days.   furosemide 40 MG tablet Commonly known as: LASIX Take 40 mg by mouth 2 (two) times daily.   hydrOXYzine 25 MG tablet Commonly known as: ATARAX Take 25 mg by mouth every 12 (twelve) hours as needed for itching.   Insulin Pen Needle 32G X 4 MM Misc Use 4x a day   metoprolol succinate 50 MG 24 hr tablet Commonly known as: TOPROL-XL TAKE (1) TABLET BY MOUTH DAILY WITH FOOD *TAKE AFTER DIALYSIS* What changed:  how much to take how to take this when to take this additional instructions   OneTouch Delica Lancets 99991111 Misc Use to  check blood sugar 4X daily.   OneTouch Verio test strip Generic drug: glucose blood Use as instructed to check blood sugar 4X daily.   OneTouch Verio w/Device Kit Use to check blood sugar 4X daily.   oxyCODONE-acetaminophen 5-325 MG tablet Commonly known as: PERCOCET/ROXICET Take 1 tablet by mouth every 4 (four) hours as needed.   pregabalin 50 MG capsule Commonly known as: LYRICA TAKE ONE CAPSULE BY MOUTH three times a day What changed:  how much to take how to take this when to take this additional instructions   Rena-Vite Rx 1 MG Tabs Take 1 tablet by mouth daily.   sertraline 50 MG tablet Commonly known as: ZOLOFT TAKE ONE TABLET BY MOUTH EVERY DAY   Toujeo Max SoloStar 300 UNIT/ML Solostar Pen Generic drug: insulin glargine (2 Unit Dial) Inject 80 Units into the skin daily.   [name] 500 MG chewable tablet Generic drug: sucroferric oxyhydroxide Chew 500-1,000 mg by mouth See admin instructions. Take 1000mg  (2 tablets) by mouth with meals and 500mg  (1 tablet) with snacks               Durable Medical Equipment  (From admission, onward)           Start     Ordered   06/08/22 1333  For home use only DME Bedside commode  Once       Question:  Patient needs a bedside commode to treat with the following condition  Answer:  S/P BKA (below knee amputation), right ([place])   06/08/22 1333   06/08/22 1304  For home use only DME lightweight manual wheelchair with seat cushion  Once       Comments: Patient suffers from bka which impairs their ability to perform daily activities like bathing, dressing in the home.  A walker will not resolve  issue with performing activities of daily living. A wheelchair will allow patient to safely perform daily activities. Patient is not able to propel themselves in the home using a standard weight wheelchair due to general weakness. Patient can self propel in the lightweight wheelchair. Length of need Lifetime. Accessories:  elevating leg rests (ELRs), wheel locks, extensions and anti-tippers.   06/08/22 1305            Diagnostic Studies: VAS US CAROTID  Result Date: 05/29/2022 Carotid Arterial Duplex Study Patient Name:  Kathryn Beck  Date of Exam:   05/28/2022 Medical Rec #: [id number]        Accession #:    [id number] Date of Birth: [birth year]        Patient Gender: F Patient Age:   62 years Exam Location:  [facility] Procedure:      VAS US CAROTID Referring Phys: Laurann Montana --------------------------------------------------------------------------------  Indications:       Carotid artery disease and patient denies any cerebrovascular                    symptoms. Risk Factors:      Hypertension, hyperlipidemia, Diabetes, no history of                    smoking. Comparison Study:  05/09/2015 RICA [id number] and LICA [id number] cm/sec. Performing Technologist: Leavy Cella RDCS  Examination Guidelines: A complete evaluation includes B-mode imaging, spectral Doppler, color Doppler, and power Doppler as needed of all accessible portions of each vessel. Bilateral testing is considered an integral part of a complete examination. Limited examinations for reoccurring indications may be performed as noted.  Right Carotid Findings: +----------+--------+--------+--------+------------------+--------+           PSV cm/sEDV cm/sStenosisPlaque DescriptionComments +----------+--------+--------+--------+------------------+--------+ CCA Prox  101     13                                         +----------+--------+--------+--------+------------------+--------+ CCA Mid   93      9                                          +----------+--------+--------+--------+------------------+--------+ CCA Distal93      13              heterogenous               +----------+--------+--------+--------+------------------+--------+ ICA Prox  64      15              smooth                      +----------+--------+--------+--------+------------------+--------+ ICA Mid   114     20                                         +----------+--------+--------+--------+------------------+--------+ ICA Distal117     16                                         +----------+--------+--------+--------+------------------+--------+ ECA       99      11                                         +----------+--------+--------+--------+------------------+--------+ +----------+--------+-------+----------------+-------------------+           PSV cm/sEDV cmsDescribe        Arm Pressure (mmHG) +----------+--------+-------+----------------+-------------------+ [id number]     2      Multiphasic, [id number]                 +----------+--------+-------+----------------+-------------------+ +---------+--------+--+--------+-+---------+ VertebralPSV cm/s69EDV cm/s7Antegrade +---------+--------+--+--------+-+---------+  Left Carotid Findings: +----------+--------+--------+--------+------------------+--------+           PSV cm/sEDV cm/sStenosisPlaque DescriptionComments +----------+--------+--------+--------+------------------+--------+ CCA Prox  67      13                                         +----------+--------+--------+--------+------------------+--------+  CCA Mid   85      12                                         +----------+--------+--------+--------+------------------+--------+ CCA Distal82      15                                         +----------+--------+--------+--------+------------------+--------+ ICA Prox  58      18              heterogenous               +----------+--------+--------+--------+------------------+--------+ ICA Mid   68      17                                         +----------+--------+--------+--------+------------------+--------+ ICA Distal65      14                                          +----------+--------+--------+--------+------------------+--------+ ECA       87      3                                          +----------+--------+--------+--------+------------------+--------+ +----------+--------+--------+-----------------------------+-------------------+           PSV cm/sEDV cm/sDescribe                     Arm Pressure (mmHG) +----------+--------+--------+-----------------------------+-------------------+ Subclavian203     73      Increased EDV and turbulent  255                                           flow consistent with known                                                 arteriovenous fistula                                                      placement                                        +----------+--------+--------+-----------------------------+-------------------+ +---------+--------+--+--------+--+---------+ VertebralPSV cm/s86EDV cm/s12Antegrade +---------+--------+--+--------+--+---------+ Unable to complete left blood pressure due to diaylsis graft.  Summary: Right Carotid: The extracranial vessels were near-normal with only minimal wall                thickening or plaque. Left Carotid: The extracranial vessels were near-normal with only minimal wall  thickening or plaque. Increased EDV and turbulent flow in               subclavian consistent with known arteriovenous fistula placement. Vertebrals:  Bilateral vertebral arteries demonstrate antegrade flow. Subclavians: Left subclavian artery flow was disturbed. Normal flow hemodynamics              were seen in the right subclavian artery. *See table(s) above for measurements and observations.  Electronically signed by Kathlyn Sacramento MD on 05/29/2022 at 11:45:45 AM.    Final    ECHOCARDIOGRAM COMPLETE  Result Date: 05/28/2022    ECHOCARDIOGRAM REPORT   Patient Name:   Kathryn Beck Date of Exam: 05/28/2022 Medical Rec #:  [id number]       Height:       64.0 in  Accession #:    [id number]      Weight:       191.0 lb Date of Birth:  [birth year]       BSA:          1.918 m Patient Age:    69 years        BP:           178/69 mmHg Patient Gender: F               HR:           86 bpm. Exam Location:  Outpatient Procedure: 2D Echo, 3D Echo, Cardiac Doppler, Color Doppler and Strain Analysis Indications:    R01.1 Murmur  History:        Patient has prior history of Echocardiogram examinations, most                 recent 01/17/2019. Signs/Symptoms:Murmur and Syncope; Risk                 Factors:Diabetes, Hypertension, Dyslipidemia and Non-Smoker.                 ESRD.  Sonographer:    Leavy Cella RDCS Referring Phys: [id number] [place]  1. Left ventricular ejection fraction, by estimation, is 60 to 65%. The left ventricle has normal function. The left ventricle has no regional wall motion abnormalities. There is moderate concentric left ventricular hypertrophy. Left ventricular diastolic parameters are consistent with Grade II diastolic dysfunction (pseudonormalization). Elevated left ventricular end-diastolic pressure.  2. Right ventricular systolic function is normal. The right ventricular size is mildly enlarged. There is normal pulmonary artery systolic pressure.  3. Left atrial size was moderately dilated.  4. Right atrial size was mildly dilated.  5. The mitral valve is grossly normal. Trivial mitral valve regurgitation. The mean mitral valve gradient is 4.0 mmHg. Moderate mitral annular calcification.  6. The aortic valve is tricuspid. There is moderate calcification of the aortic valve. There is mild thickening of the aortic valve. Aortic valve regurgitation is trivial. Mild aortic valve stenosis.  7. The inferior vena cava is normal in size with greater than 50% respiratory variability, suggesting right atrial pressure of 3 mmHg. Comparison(s): No significant change from prior study. Conclusion(s)/Recommendation(s): Otherwise normal echocardiogram,  with minor abnormalities described in the report. FINDINGS  Left Ventricle: Left ventricular ejection fraction, by estimation, is 60 to 65%. The left ventricle has normal function. The left ventricle has no regional wall motion abnormalities. The left ventricular internal cavity size was normal in size. There is  moderate concentric left ventricular hypertrophy. Left ventricular diastolic parameters are consistent with Grade II diastolic dysfunction (pseudonormalization). Elevated  left ventricular end-diastolic pressure. Right Ventricle: The right ventricular size is mildly enlarged. Right vetricular wall thickness was not well visualized. Right ventricular systolic function is normal. There is normal pulmonary artery systolic pressure. The tricuspid regurgitant velocity  is 1.57 m/s, and with an assumed right atrial pressure of 3 mmHg, the estimated right ventricular systolic pressure is 0000000 mmHg. Left Atrium: Left atrial size was moderately dilated. Right Atrium: Right atrial size was mildly dilated. Pericardium: There is no evidence of pericardial effusion. Mitral Valve: The mitral valve is grossly normal. There is mild thickening of the mitral valve leaflet(s). There is mild calcification of the mitral valve leaflet(s). Moderate mitral annular calcification. Trivial mitral valve regurgitation. MV peak gradient, 9.2 mmHg. The mean mitral valve gradient is 4.0 mmHg. Tricuspid Valve: The tricuspid valve is normal in structure. Tricuspid valve regurgitation is trivial. No evidence of tricuspid stenosis. Aortic Valve: The aortic valve is tricuspid. There is moderate calcification of the aortic valve. There is mild thickening of the aortic valve. Aortic valve regurgitation is trivial. Mild aortic stenosis is present. Aortic valve mean gradient measures 13.0 mmHg. Aortic valve peak gradient measures 21.2 mmHg. Aortic valve area, by VTI measures 1.68 cm. Pulmonic Valve: The pulmonic valve was grossly normal. Pulmonic  valve regurgitation is trivial. No evidence of pulmonic stenosis. Aorta: The aortic root, ascending aorta, aortic arch and descending aorta are all structurally normal, with no evidence of dilitation or obstruction. Venous: The inferior vena cava is normal in size with greater than 50% respiratory variability, suggesting right atrial pressure of 3 mmHg. IAS/Shunts: The atrial septum is grossly normal.  LEFT VENTRICLE PLAX 2D LVIDd:         4.90 cm   Diastology LVIDs:         2.37 cm   LV e' medial:    4.79 cm/s LV PW:         1.57 cm   LV E/e' medial:  25.9 LV IVS:        1.52 cm   LV e' lateral:   6.64 cm/s LVOT diam:     2.10 cm   LV E/e' lateral: 18.7 LV SV:         76 LV SV Index:   40 LVOT Area:     3.46 cm                           3D Volume EF:                          3D EF:        61 %                          LV EDV:       210 ml                          LV ESV:       83 ml                          LV SV:        127 ml RIGHT VENTRICLE RV Basal diam:  4.88 cm RV Mid diam:    2.91 cm RV S prime:     18.50 cm/s TAPSE (M-mode): 4.0 cm LEFT ATRIUM  Index        RIGHT ATRIUM           Index LA diam:        5.20 cm 2.71 cm/m   RA Area:     18.60 cm LA Vol (A2C):   99.6 ml 51.92 ml/m  RA Volume:   56.50 ml  29.45 ml/m LA Vol (A4C):   56.6 ml 29.50 ml/m LA Biplane Vol: 76.9 ml 40.08 ml/m  AORTIC VALVE AV Area (Vmax):    1.76 cm AV Area (Vmean):   1.47 cm AV Area (VTI):     1.68 cm AV Vmax:           230.00 cm/s AV Vmean:          176.000 cm/s AV VTI:            0.454 m AV Peak Grad:      21.2 mmHg AV Mean Grad:      13.0 mmHg LVOT Vmax:         117.00 cm/s LVOT Vmean:        74.500 cm/s LVOT VTI:          0.220 m LVOT/AV VTI ratio: 0.48  AORTA Ao Root diam: 3.20 cm Ao Asc diam:  3.50 cm MITRAL VALVE                TRICUSPID VALVE MV Area (PHT): 5.13 cm     TR Peak grad:   9.9 mmHg MV Area VTI:   2.09 cm     TR Vmax:        157.00 cm/s MV Peak grad:  9.2 mmHg MV Mean grad:  4.0 mmHg     SHUNTS  MV Vmax:       1.52 m/s     Systemic VTI:  0.22 m MV Vmean:      92.2 cm/s    Systemic Diam: 2.10 cm MV Decel Time: 148 msec MR Peak grad: 82.8 mmHg MR Vmax:      455.00 cm/s MV E velocity: 124.00 cm/s MV A velocity: 129.00 cm/s MV E/A ratio:  0.96 Buford Dresser MD Electronically signed by Buford Dresser MD Signature Date/Time: 05/28/2022/3:26:17 PM    Final     Patient benefited maximally from their hospital stay and there were no complications.     Disposition: Discharge disposition: [facility] Facility      Discharge Instructions     Call MD / Call 911   Complete by: As directed    If you experience chest pain or shortness of breath, CALL 911 and be transported to the hospital emergency room.  If you develope a fever above 101 F, pus (white drainage) or increased drainage or redness at the wound, or calf pain, call your surgeon's office.   Call MD / Call 911   Complete by: As directed    If you experience chest pain or shortness of breath, CALL 911 and be transported to the hospital emergency room.  If you develope a fever above 101 F, pus (white drainage) or increased drainage or redness at the wound, or calf pain, call your surgeon's office.   Call MD / Call 911   Complete by: As directed    If you experience chest pain or shortness of breath, CALL 911 and be transported to the hospital emergency room.  If you develope a fever above 101 F, pus (white drainage) or increased drainage or redness at the wound, or calf pain, call your  surgeon's office.   Call MD / Call 911   Complete by: As directed    If you experience chest pain or shortness of breath, CALL 911 and be transported to the hospital emergency room.  If you develope a fever above 101 F, pus (white drainage) or increased drainage or redness at the wound, or calf pain, call your surgeon's office.   Constipation Prevention   Complete by: As directed    Drink plenty of fluids.  Prune juice may be helpful.  You may use  a stool softener, such as Colace (over the counter) 100 mg twice a day.  Use MiraLax (over the counter) for constipation as needed.   Constipation Prevention   Complete by: As directed    Drink plenty of fluids.  Prune juice may be helpful.  You may use a stool softener, such as Colace (over the counter) 100 mg twice a day.  Use MiraLax (over the counter) for constipation as needed.   Constipation Prevention   Complete by: As directed    Drink plenty of fluids.  Prune juice may be helpful.  You may use a stool softener, such as Colace (over the counter) 100 mg twice a day.  Use MiraLax (over the counter) for constipation as needed.   Constipation Prevention   Complete by: As directed    Drink plenty of fluids.  Prune juice may be helpful.  You may use a stool softener, such as Colace (over the counter) 100 mg twice a day.  Use MiraLax (over the counter) for constipation as needed.   Diet - low sodium heart healthy   Complete by: As directed    Diet - low sodium heart healthy   Complete by: As directed    Diet - low sodium heart healthy   Complete by: As directed    Diet - low sodium heart healthy   Complete by: As directed    Increase activity slowly as tolerated   Complete by: As directed    Increase activity slowly as tolerated   Complete by: As directed    Increase activity slowly as tolerated   Complete by: As directed    Increase activity slowly as tolerated   Complete by: As directed    Post-operative opioid taper instructions:   Complete by: As directed    POST-OPERATIVE OPIOID TAPER INSTRUCTIONS: It is important to wean off of your opioid medication as soon as possible. If you do not need pain medication after your surgery it is ok to stop day one. Opioids include: Codeine, Hydrocodone(Norco, Vicodin), Oxycodone(Percocet, oxycontin) and hydromorphone amongst others.  Long term and even short term use of opiods can cause: Increased pain  response Dependence Constipation Depression Respiratory depression And more.  Withdrawal symptoms can include Flu like symptoms Nausea, vomiting And more Techniques to manage these symptoms Hydrate well Eat regular healthy meals Stay active Use relaxation techniques(deep breathing, meditating, yoga) Do Not substitute Alcohol to help with tapering If you have been on opioids for less than two weeks and do not have pain than it is ok to stop all together.  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 Maintain the same interval or time between taking each dose and first decrease the dose.  Cut the total daily intake of opioids by one tablet each day Next start to increase the time between doses. The last dose that should be eliminated is the evening dose.  Post-operative opioid taper instructions:   Complete by: As directed    POST-OPERATIVE OPIOID TAPER INSTRUCTIONS: It is important to wean off of your opioid medication as soon as possible. If you do not need pain medication after your surgery it is ok to stop day one. Opioids include: Codeine, Hydrocodone(Norco, Vicodin), Oxycodone(Percocet, oxycontin) and hydromorphone amongst others.  Long term and even short term use of opiods can cause: Increased pain response Dependence Constipation Depression Respiratory depression And more.  Withdrawal symptoms can include Flu like symptoms Nausea, vomiting And more Techniques to manage these symptoms Hydrate well Eat regular healthy meals Stay active Use relaxation techniques(deep breathing, meditating, yoga) Do Not substitute Alcohol to help with tapering If you have been on opioids for less than two weeks and do not have pain than it is ok to stop all together.  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 Maintain the same interval or time between taking  each dose and first decrease the dose.  Cut the total daily intake of opioids by one tablet each day Next start to increase the time between doses. The last dose that should be eliminated is the evening dose.      Post-operative opioid taper instructions:   Complete by: As directed    POST-OPERATIVE OPIOID TAPER INSTRUCTIONS: It is important to wean off of your opioid medication as soon as possible. If you do not need pain medication after your surgery it is ok to stop day one. Opioids include: Codeine, Hydrocodone(Norco, Vicodin), Oxycodone(Percocet, oxycontin) and hydromorphone amongst others.  Long term and even short term use of opiods can cause: Increased pain response Dependence Constipation Depression Respiratory depression And more.  Withdrawal symptoms can include Flu like symptoms Nausea, vomiting And more Techniques to manage these symptoms Hydrate well Eat regular healthy meals Stay active Use relaxation techniques(deep breathing, meditating, yoga) Do Not substitute Alcohol to help with tapering If you have been on opioids for less than two weeks and do not have pain than it is ok to stop all together.  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 Maintain the same interval or time between taking each dose and first decrease the dose.  Cut the total daily intake of opioids by one tablet each day Next start to increase the time between doses. The last dose that should be eliminated is the evening dose.      Post-operative opioid taper instructions:   Complete by: As directed    POST-OPERATIVE OPIOID TAPER INSTRUCTIONS: It is important to wean off of your opioid medication as soon as possible. If you do not need pain medication after your surgery it is ok to stop day one. Opioids include: Codeine, Hydrocodone(Norco, Vicodin), Oxycodone(Percocet, oxycontin) and hydromorphone amongst others.  Long term and  even short term use of opiods can cause: Increased pain response Dependence Constipation Depression Respiratory depression And more.  Withdrawal symptoms can include Flu like symptoms Nausea, vomiting And more Techniques to manage these symptoms Hydrate well Eat regular healthy meals Stay active Use relaxation techniques(deep breathing, meditating, yoga) Do Not substitute Alcohol to help with tapering If you have been on opioids for less than two weeks and do not have pain than it is ok to stop all together.  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 Maintain the  same interval or time between taking each dose and first decrease the dose.  Cut the total daily intake of opioids by one tablet each day Next start to increase the time between doses. The last dose that should be eliminated is the evening dose.          Contact information for follow-up providers     [name], MD Follow up in 1 week(s).   Specialty: Orthopedic Surgery Contact information: [name] [address] [phone]              Contact information for after-discharge care     Destination     [facility] .   Service: Skilled Nursing Contact information: [address] [place] [phone]                      Signed: [name] 06/10/2022, 7:36 AM

## 2022-06-10 NOTE — Patient Instructions (Addendum)
Visit Information  Thank you for taking time to visit with me today. Please don't hesitate to contact me if I can be of assistance to you.   Following are the goals we discussed today:   Goals Addressed             This Visit's Progress    Receive Assistance Applying for Aid & Attendance Benefits, through [employer].   On track    Care Coordination Interventions:  Interventions Today    Flowsheet Row Most Recent Value  Chronic Disease   Chronic disease during today's visit Diabetes, Hypertension (HTN), Chronic Kidney Disease/End Stage Renal Disease (ESRD), Congestive Heart Failure (CHF), Other  [Neuropathy, Chronic Pain Syndrome, Anxiety, Depression, Memory Loss, Neurocognitive Disorder & Requires Maximum Assistance with Activities of Daily Living]  General Interventions   General Interventions Discussed/Reviewed General Interventions Reviewed, Annual Eye Exam, General Interventions Discussed, Labs, Annual Foot Exam, Lipid Profile, Durable Medical Equipment (DME), Vaccines, Health Screening, [employer], Doctor Visits, Communication with, Level of Care  [Primary Care Provider]  Labs Hgb A1c every 3 months, Kidney Function  Vaccines COVID-19, Flu, Pneumonia, RSV, Shingles, Tetanus/Pertussis/Diphtheria  [Encouraged]  Doctor Visits Discussed/Reviewed Doctor Visits Discussed, Doctor Visits Reviewed, Annual Wellness Visits, PCP, Specialist  [Encouraged]  Health Screening Colonoscopy, Mammogram  [Encouraged]  Durable Medical Equipment (DME) BP Cuff, Glucomoter, Environmental consultant, Wheelchair, Estate agent  PCP/Specialist Visits Compliance with follow-up visit  Communication with PCP/Specialists, RN  Level of Care Adult Daycare, Location manager, Assisted Living, [place]  Applications Medicaid, Personal Care Services  Exercise Interventions   Exercise Discussed/Reviewed Exercise Discussed, Exercise Reviewed, Physical  Activity, Assistive device use and maintanence  Physical Activity Discussed/Reviewed Physical Activity Discussed, Physical Activity Reviewed, Types of exercise, Home Exercise Program (HEP)  [Encouraged]  Education Interventions   Education Provided Provided Engineer, site, Provided Web-based Education, Provided Education  Provided Verbal Education On Nutrition, Foot Care, Eye Care, Labs, Blood Sugar Monitoring, Mental Health/Coping with Illness, Applications, Exercise, Medication, When to see the doctor, [employer], Engineer, materials, [place] Discussed, Mental Health Reviewed, Coping Strategies, Crisis, Anxiety, Depression, Grief and Loss, Substance Abuse, Suicide  Nutrition Interventions   Nutrition Discussed/Reviewed Nutrition Discussed, Nutrition Reviewed, Adding fruits and vegetables, Fluid intake, Decreasing sugar intake, Increaing proteins, Decreasing fats, Decreasing salt  Pharmacy Interventions   Pharmacy Dicussed/Reviewed Pharmacy Topics Discussed, Pharmacy Topics Reviewed, Medication Adherence, Affording Medications, Referral to Pharmacist  Medication Adherence Unable to refill medication  Referral to Pharmacist Cannot afford medications  Safety Interventions   Safety Discussed/Reviewed Safety Discussed, Safety Reviewed, Fall Risk, Home Safety  Home Safety Assistive Devices, Need for home safety assessment, Refer for home visit, Contact provider for referral to PT/OT, Refer for community resources, Contact home health agency  Advanced Directive Interventions   Advanced Directives Discussed/Reviewed Advanced Directives Discussed, Advanced Directives Reviewed, Advanced Care Planning, Provided resource for acquiring and filling out documents     Active Listening & Reflection Utilized.  Emotional Support Provided. Verbalization of Feelings Encouraged.  Caregiver  Stress Acknowledged. Symptoms of Caregiver Burnout Validated. Caregiver Resources Discussed. Caregiver Support Groups Reviewed. Self-Enrollment in Caregiver Support Group of Interest Emphasized. Solution-Focused Strategies Promoted. Problem Solving Interventions Activated. Task-Centered Solutions Implemented.  Cognitive Behavioral Therapy Performed.  Client-Centered Therapy Indicated. Acceptance & Commitment Therapy Initiated. CSW Collaboration with Daughter, [name] to Confirm Patient's Agreement to Receive Short-Term Rehabilitative Services at [place]  [place] [phone]), Upon Discharge from [place] with Moises Blood, Administrator at [facility] [phone]), to [facility] Authorization/Approval & Bed Availability for Patient, Once Medically Stable & Ready for Discharge from [place] with Moises Blood, Administrator at [facility] [phone]), to Confirm Staffing & Transportation Availability for Patient to Get To & From Hemodialysis Treatments, Monday's, Wednesday's & Friday's at 6:30 AM, at [facility] (# [phone]). CSW Collaboration with Daughter, [name] to Confirm Plan for Patient to Return Home to Live with Husband, Upon Completion of Short-Term Rehabilitative Services at [facility] [phone]). CSW Collaboration with Daughter, [name] to Discuss CSW's Continued Involvement with Patient's Care & Re-Engagement of Services Upon Discharge from [facility] [phone]). CSW Collaboration with Daughter, [name] to [employer] with CSW [phone]), if She Has Questions, Needs Assistance, or If Additional Social Work Needs Are Identified  Between Now & Our Next Scheduled Follow-Up Outreach Call.      Our next appointment is by telephone on 06/22/2022 at 9:00 am.  Please call the care guide team at [phone] if you need to cancel or reschedule your appointment.   If you are experiencing a Mental Health or [place] or need someone to talk to, please call the Suicide and Crisis Lifeline: 988 call the [place] National Suicide Prevention Lifeline: (562) 357-0417 or TTY: 405-112-3127 TTY (972)887-9959) to talk to a trained counselor call 1-800-273-TALK (toll free, 24 hour hotline) go to [facility] [address], [place] [phone]) call the [place]: [phone] call 911  Patient verbalizes understanding of instructions and care plan provided today and agrees to view in [place]. Active MyChart status and patient understanding of how to access instructions and care plan via MyChart confirmed with patient.     Telephone follow up appointment with care management team member scheduled for:  06/22/2022 at 9:00 am.  Nat Christen, BSW, MSW, [place]  Licensed Clinical Social Worker  [place]  Mailing [place]. [address] [place], [place] [address] Physical Address-[address], [place], [place] [address] Toll Free Main # [phone] Fax # [phone] Cell # [phone] [email or web address]

## 2022-06-10 NOTE — Progress Notes (Signed)
Pt left via PT[facility] with belongings.  Pt VS wnl and had no s/s of distress.

## 2022-06-10 NOTE — Patient Outreach (Signed)
Care Coordination   Follow Up Visit Note   06/10/2022  Name: Kathryn Beck MRN: [id number] DOB: 26-Jan-1956  Kathryn Beck is a 67 y.o. year old female who sees Kathryn Helper, MD for primary care. I spoke with daughter, Kathryn Beck by phone today.  What matters to the patients health and wellness today?  Receive Assistance Applying for Aid & Attendance Benefits, through [employer].     Goals Addressed             This Visit's Progress    Receive Assistance Applying for Aid & Attendance Benefits, through [employer].   On track    Care Coordination Interventions:  Interventions Today    Flowsheet Row Most Recent Value  Chronic Disease   Chronic disease during today's visit Diabetes, Hypertension (HTN), Chronic Kidney Disease/End Stage Renal Disease (ESRD), Congestive Heart Failure (CHF), Other  [Neuropathy, Chronic Pain Syndrome, Anxiety, Depression, Memory Loss, Neurocognitive Disorder & Requires Maximum Assistance with Activities of Daily Living]  General Interventions   General Interventions Discussed/Reviewed General Interventions Reviewed, Annual Eye Exam, General Interventions Discussed, Labs, Annual Foot Exam, Lipid Profile, Durable Medical Equipment (DME), Vaccines, Health Screening, [employer], Doctor Visits, Communication with, Level of Care  [Primary Care Provider]  Labs Hgb A1c every 3 months, Kidney Function  Vaccines COVID-19, Flu, Pneumonia, RSV, Shingles, Tetanus/Pertussis/Diphtheria  [Encouraged]  Doctor Visits Discussed/Reviewed Doctor Visits Discussed, Doctor Visits Reviewed, Annual Wellness Visits, PCP, Specialist  [Encouraged]  Health Screening Colonoscopy, Mammogram  [Encouraged]  Durable Medical Equipment (DME) BP Cuff, Glucomoter, Environmental consultant, Wheelchair, Estate agent  PCP/Specialist Visits Compliance with follow-up visit  Communication with PCP/Specialists, RN  Level of Care Adult Daycare,  Location manager, Assisted Living, [place]  Applications Medicaid, Personal Care Services  Exercise Interventions   Exercise Discussed/Reviewed Exercise Discussed, Exercise Reviewed, Physical Activity, Assistive device use and maintanence  Physical Activity Discussed/Reviewed Physical Activity Discussed, Physical Activity Reviewed, Types of exercise, Home Exercise Program (HEP)  [Encouraged]  Education Interventions   Education Provided Provided Engineer, site, Provided Web-based Education, Provided Education  Provided Verbal Education On Nutrition, Foot Care, Eye Care, Labs, Beck Sugar Monitoring, Mental Health/Coping with Illness, Applications, Exercise, Medication, When to see the doctor, [employer], Engineer, materials, [place] Discussed, Mental Health Reviewed, Coping Strategies, Crisis, Anxiety, Depression, Grief and Loss, Substance Abuse, Suicide  Nutrition Interventions   Nutrition Discussed/Reviewed Nutrition Discussed, Nutrition Reviewed, Adding fruits and vegetables, Fluid intake, Decreasing sugar intake, Increaing proteins, Decreasing fats, Decreasing salt  Pharmacy Interventions   Pharmacy Dicussed/Reviewed Pharmacy Topics Discussed, Pharmacy Topics Reviewed, Medication Adherence, Affording Medications, Referral to Pharmacist  Medication Adherence Unable to refill medication  Referral to Pharmacist Cannot afford medications  Safety Interventions   Safety Discussed/Reviewed Safety Discussed, Safety Reviewed, Fall Risk, Home Safety  Home Safety Assistive Devices, Need for home safety assessment, Refer for home visit, Contact provider for referral to PT/OT, Refer for community resources, Contact home health agency  Advanced Directive Interventions   Advanced Directives Discussed/Reviewed Advanced Directives Discussed, Advanced  Directives Reviewed, Advanced Care Planning, Provided resource for acquiring and filling out documents     Active Listening & Reflection Utilized.  Emotional Support Provided. Verbalization of Feelings Encouraged.  Caregiver Stress Acknowledged. Symptoms of Caregiver Burnout Validated. Caregiver Resources Discussed. Caregiver Support Groups Reviewed. Self-Enrollment in Caregiver Support Group of Interest Emphasized. Solution-Focused Strategies Promoted. Problem Solving Interventions Activated. Task-Centered  Solutions Implemented.  Cognitive Behavioral Therapy Performed.  Client-Centered Therapy Indicated. Acceptance & Commitment Therapy Initiated. CSW Collaboration with Daughter, Kathryn Beck to Confirm Patient's Agreement to [place] at [facility] [phone]), Upon Discharge from [place] with Kathryn Beck, Administrator at [facility] [phone]), to [facility] Authorization/Approval & Bed Availability for Patient, Once Medically Stable & Ready for Discharge from [place] with Kathryn Beck, Administrator at [facility] [phone]), to Confirm Staffing & Transportation Availability for Patient to Get To & From Hemodialysis Treatments, Monday's, Wednesday's & Friday's at 6:30 AM, at [facility] (# [phone]). CSW Collaboration with Daughter, Kathryn Beck to Confirm Plan for Patient to Return Home to Live with Husband, Upon Completion of Short-Term Rehabilitative Services at [facility] [phone]). CSW Collaboration with Daughter, Kathryn Beck to Discuss CSW's Continued Involvement with Patient's Care & Re-Engagement of Services Upon Discharge from [facility] [phone]). CSW Collaboration with Daughter, Kathryn Beck to [employer] with CSW [phone]), if She Has Questions, Needs Assistance, or If Additional Social Work Needs Are Identified Between Now & Our Next Scheduled Follow-Up Outreach Call.      SDOH assessments and interventions completed:  Yes.  Care Coordination Interventions:  Yes, provided.   Follow up plan: Follow up call scheduled for 06/22/2022 at 9:00 am.  Encounter Outcome:  Pt. Visit Completed.   Nat Christen, BSW, MSW, LCSW  Licensed Education officer, [facility]  Mailing [place] [address] [place], [place] [address] Physical Address-[address], [place], [place] [address] Toll Free Main # [phone] Fax # [phone] Cell # [phone] [email or web address]

## 2022-06-10 NOTE — Progress Notes (Signed)
Pt to d/c to snf today per CSW. Contacted [facility] and spoke to [place]. Clinic advised that pt will d/c to [place] today and should resume care on Friday. D/C summary and last renal note faxed to clinic for continuation of care. CSW has provided pt's HD clinic and schedule to snf and they can accommodate.   Melven Sartorius Renal Navigator [phone]

## 2022-06-11 ENCOUNTER — Ambulatory Visit (INDEPENDENT_AMBULATORY_CARE_PROVIDER_SITE_OTHER): Payer: Medicare PPO | Admitting: Orthopedic Surgery

## 2022-06-11 DIAGNOSIS — I10 Essential (primary) hypertension: Secondary | ICD-10-CM | POA: Diagnosis not present

## 2022-06-11 DIAGNOSIS — N186 End stage renal disease: Secondary | ICD-10-CM | POA: Diagnosis not present

## 2022-06-11 DIAGNOSIS — I739 Peripheral vascular disease, unspecified: Secondary | ICD-10-CM | POA: Diagnosis not present

## 2022-06-11 DIAGNOSIS — Z89511 Acquired absence of right leg below knee: Secondary | ICD-10-CM | POA: Diagnosis not present

## 2022-06-11 DIAGNOSIS — R531 Weakness: Secondary | ICD-10-CM | POA: Diagnosis not present

## 2022-06-12 ENCOUNTER — Encounter: Payer: Self-pay | Admitting: Orthopedic Surgery

## 2022-06-12 DIAGNOSIS — N2581 Secondary hyperparathyroidism of renal origin: Secondary | ICD-10-CM | POA: Diagnosis not present

## 2022-06-12 DIAGNOSIS — N186 End stage renal disease: Secondary | ICD-10-CM | POA: Diagnosis not present

## 2022-06-12 DIAGNOSIS — Z992 Dependence on renal dialysis: Secondary | ICD-10-CM | POA: Diagnosis not present

## 2022-06-12 NOTE — Progress Notes (Signed)
Office Visit Note   Patient: Kathryn Beck           Date of Birth: 01-21-1956           MRN: [id number] Visit Date: 06/11/2022              Requested by: Fayrene Helper, MD [address], [place],  [place] [address] PCP: Fayrene Helper, MD  Chief Complaint  Patient presents with   Right Leg - Routine Post Op    05/29/2022 right BKA       HPI: Patient is a 67 year old woman who presents 2 weeks status post right below-knee amputation.  She was discharged from the hospital yesterday.  Wound VAC was discontinued yesterday.  Assessment & Plan: Visit Diagnoses:  1. Right below-knee amputee [facility])     Plan: Patient will wear the compression sleeve wash with soap and water.  Follow-Up Instructions: Return in about 2 weeks (around 06/25/2022).   Ortho Exam  Patient is alert, oriented, no adenopathy, well-dressed, normal affect, normal respiratory effort. Examination the wound is well-approximated there is some hypergranulation tissue medially this was touched with silver nitrate.  Imaging: No results found.   Labs: Lab Results  Component Value Date   HGBA1C 9.3 (A) 04/16/2022   HGBA1C 8.9 (A) 10/28/2021   HGBA1C 9.1 (A) 07/22/2021   ESRSEDRATE 37 (H) 04/25/2022   ESRSEDRATE 27 (H) 04/24/2008   CRP 29.4 (H) 04/25/2022   CRP 20.1 (H) 12/02/2020   CRP 6.5 (H) 04/07/2019   LABURIC 1.8 (L) 06/07/2018   LABURIC 6.2 04/22/2018   LABURIC 9.3 (H) 10/07/2015   REPTSTATUS 11/09/2017 FINAL 11/07/2017   GRAMSTAIN No WBC Seen 04/03/2014   GRAMSTAIN Rare Squamous Epithelial Cells Present 04/03/2014   GRAMSTAIN Few Gram Positive Cocci In Pairs 04/03/2014   GRAMSTAIN Few Gram Negative Rods 04/03/2014   CULT (A) 11/07/2017    <10,000 COLONIES/mL INSIGNIFICANT GROWTH Performed at [facility] [address], [place], [place] [address]    LABORGA Normal Oropharyngeal [place] 04/03/2014     Lab Results  Component Value Date   ALBUMIN 2.8 (L) 06/10/2022    ALBUMIN 2.8 (L) 06/08/2022   ALBUMIN 2.7 (L) 06/07/2022   PREALBUMIN 24 04/25/2022    Lab Results  Component Value Date   MG 1.9 04/30/2022   MG 2.3 04/29/2022   MG 2.1 04/28/2022   Lab Results  Component Value Date   VD25OH 30.9 09/28/2017   VD25OH 15 (L) 10/07/2015   VD25OH 16 (L) 04/25/2015    Lab Results  Component Value Date   PREALBUMIN 24 04/25/2022      Latest Ref Rng & Units 06/10/2022    8:30 AM 06/08/2022    4:36 AM 06/05/2022    4:26 AM  CBC EXTENDED  WBC 4.0 - 10.5 K/uL 8.3  8.5  8.2   RBC 3.87 - 5.11 MIL/uL 3.56  3.56  3.52   Hemoglobin 12.0 - 15.0 g/dL 9.0  8.7  8.7   HCT 36.0 - 46.0 % 29.2  29.5  29.6   Platelets 150 - 400 K/uL 311  339  273      There is no height or weight on file to calculate BMI.  Orders:  No orders of the defined types were placed in this encounter.  No orders of the defined types were placed in this encounter.    Procedures: No procedures performed  Clinical Data: No additional findings.  ROS:  All other  systems negative, except as noted in the HPI. Review of Systems  Objective: Vital Signs: There were no vitals taken for this visit.  Specialty Comments:  No specialty comments available.  PMFS History: Patient Active Problem List   Diagnosis Date Noted   S/P BKA (below knee amputation), right ([place]) 05/29/2022   Confusion 05/19/2022   Visual hallucinations 05/19/2022   Watery eyes 05/19/2022   Requires assistance with activities of daily living (ADL) 04/26/2022   Diabetic wet gangrene of the foot ([place]) 04/25/2022   Gangrene of right foot ([place]) 04/25/2022   PAD (peripheral artery disease) ([place]) 04/25/2022   Other osteoporosis without current pathological fracture 11/10/2021   Hypocalcemia 11/10/2021   Fall 11/02/2021   Left foot pain 10/31/2021   Low back pain with left-sided sciatica 10/31/2021   Left ear impacted cerumen 09/18/2021   Chronic left shoulder pain 06/10/2021   Abnormal CXR 03/10/2021   Ear  pain, bilateral 03/10/2021   Morbid obesity ([place]) 03/10/2021   Subungual hematoma of second toe of left foot 10/28/2020   Insomnia 10/28/2020   Memory loss or impairment 09/26/2020   Forgetfulness 09/26/2020   Recurrent vertigo 09/26/2020   Abnormal MRI, cervical spine 03/04/2020   Right leg weakness 02/28/2020   Knee pain, right 10/23/2019   Cough 08/09/2019   Carpal tunnel syndrome 06/13/2019   Chronic pain syndrome 06/13/2019   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[place]) 06/13/2019   Neck pain 03/23/2019   Near syncope 12/05/2018   ESRD on hemodialysis ([place]) 11/22/2018   Ischemic heart disease 09/25/2018   Not currently working due to disabled status 06/13/2018   ESRD on dialysis ([place]) 06/12/2018   HTN (hypertension) 06/12/2018   Depression 03/09/2018   Adrenal mass, left ([place]) 11/09/2017   MGUS (monoclonal gammopathy of unknown significance) 11/05/2017   Shoulder pain 11/01/2017   Chronic diastolic (congestive) heart failure ([place]) 09/20/2017   Bilateral leg weakness 09/09/2017   Adrenal adenoma 09/03/2017   Uncontrolled type 2 diabetes mellitus 08/18/2017   Unsteady gait 07/11/2017   Recurrent falls 07/11/2017   Anemia of chronic disease 03/24/2017   Personal history of colonic polyps 02/10/2017   Dysphagia 11/05/2016   Irritable bowel syndrome 02/04/2016   Hospital discharge follow-up 10/27/2015   Intolerant of heat 10/07/2014   Cardiac murmur 01/17/2014   Back pain with left-sided radiculopathy 09/18/2013   Seasonal allergies 03/10/2013   Lipoma of back 12/22/2012   Other seasonal allergic rhinitis 08/22/2012   OSA (obstructive sleep apnea) 06/02/2011   Chronic kidney disease, stage 4 (severe) ([place]) 01/13/2011   Neuropathy 04/16/2010   Malaise and fatigue 07/24/2009   Personal history of other diseases of the nervous system and sense organs 05/08/2009   LUPUS ERYTHEMATOSUS, DISCOID 06/05/2008   Arm pain 05/30/2008   Hyperlipidemia 06/07/2007    Obesity 06/07/2007   Malignant hypertension 06/07/2007   Type 2 diabetes mellitus with hyperglycemia ([place]) 06/07/2007   Diabetes mellitus ([place]) 06/07/2007   Past Medical History:  Diagnosis Date   Acid reflux    Anemia    Arthritis    Axillary masses    Soft tissue - status post excision   Back pain    COVID-19 virus infection 04/06/2019   Depression    End-stage renal disease ([place])    M/W/F dialysis   Essential hypertension    Headache    years ago   History of blood transfusion    History of cardiac catheterization    Normal coronary arteries October 2020  History of claustrophobia    History of pneumonia 2019   Hypoxia 04/03/2019   Mixed hyperlipidemia    Obesity    Pancreatitis    Peritoneal dialysis catheter in place [facility])    Pneumonia due to COVID-19 virus 04/02/2019   Sleep apnea    Noncompliant with CPAP   Stroke ([place])    mini stroke   Type 2 diabetes mellitus ([place])     Family History  Problem Relation Age of Onset   Hypertension Father    Hypercholesterolemia Father    Arthritis Father    Hypertension Sister    Hypercholesterolemia Sister    Breast cancer Sister    Hypertension Sister    Colon cancer Neg Hx    Colon polyps Neg Hx     Past Surgical History:  Procedure Laterality Date   ABDOMINAL AORTOGRAM W/LOWER EXTREMITY N/A 04/30/2022   Procedure: ABDOMINAL AORTOGRAM W/LOWER EXTREMITY;  Surgeon: Cherre Robins, MD;  Location: [facility];  Service: Cardiovascular;  Laterality: N/A;   ABDOMINAL HYSTERECTOMY     AMPUTATION Right 05/29/2022   Procedure: RIGHT BELOW THE KNEE AMPUTATION;  Surgeon: [name], MD;  Location: [facility];  Service: Orthopedics;  Laterality: Right;   AV FISTULA PLACEMENT Left 09/02/2017   Procedure: creation of left arm ARTERIOVENOUS (AV) FISTULA;  Surgeon: Serafina Mitchell, MD;  Location[facility] OR;  Service: Vascular;  Laterality: Left;   COLONOSCOPY  2008   Dr. Oneida Alar: normal    COLONOSCOPY N/A 12/18/2016   Dr. Oneida Alar:  multiple tubular adenomas, internal hemorrhoids. Surveillance in 3 years    ESOPHAGEAL DILATION N/A 10/13/2015   Procedure: ESOPHAGEAL DILATION;  Surgeon: Rogene Houston, MD;  [facility];  Service: Endoscopy;  Laterality: N/A;   ESOPHAGOGASTRODUODENOSCOPY N/A 10/13/2015   Dr. Laural Golden: chronic gastritis on path, no H.pylori. Empiric dilation    ESOPHAGOGASTRODUODENOSCOPY N/A 12/18/2016   Dr. Oneida Alar: mild gastritis. BRAVO study revealed uncontrolled GERD. Dysphagia secondary to uncontrolled reflux   FOOT SURGERY Bilateral    "nerve"     LEFT HEART CATH AND CORONARY ANGIOGRAPHY N/A 12/29/2018   Procedure: LEFT HEART CATH AND CORONARY ANGIOGRAPHY;  Surgeon: Jettie Booze, MD;  Location: [facility];  Service: Cardiovascular;  Laterality: N/A;   LOWER EXTREMITY ANGIOGRAPHY Right 05/04/2022   Procedure: Lower Extremity Angiography;  Surgeon: Broadus John, MD;  Location: [facility];  Service: Cardiovascular;  Laterality: Right;   LUNG BIOPSY     MASS EXCISION Right 01/09/2013   Procedure: EXCISION OF NEOPLASM OF RIGHT  AXILLA  AND EXCISION OF NEOPLASM OF LEFT AXILLA;  Surgeon: Jamesetta So, MD;  Location: [facility];  Service: General;  Laterality: Right;  procedure end @ 08:23   MYRINGOTOMY WITH TUBE PLACEMENT Bilateral 04/28/2017   Procedure: BILATERAL MYRINGOTOMY WITH TUBE PLACEMENT;  Surgeon: Leta Baptist, MD;  Location: [facility];  Service: ENT;  Laterality: Bilateral;   PERIPHERAL VASCULAR BALLOON ANGIOPLASTY Right 05/04/2022   Procedure: PERIPHERAL VASCULAR BALLOON ANGIOPLASTY;  Surgeon: Broadus John, MD;  Location: [facility];  Service: Cardiovascular;  Laterality: Right;  PT   PERIPHERAL VASCULAR INTERVENTION Right 05/04/2022   Procedure: PERIPHERAL VASCULAR INTERVENTION;  Surgeon: Broadus John, MD;  Location: [facility];  Service: Cardiovascular;  Laterality: Right;  SFA   REVISION OF ARTERIOVENOUS GORETEX GRAFT Left 05/04/2018   Procedure:  TRANSPOSITION OF CEPHALIC VEIN ARTERIOVENOUS FISTULA LEFT ARM;  Surgeon: Rosetta Posner, MD;  Location: [facility]  OR;  Service: Vascular;  Laterality: Left;   SAVORY DILATION N/A 12/18/2016   Procedure: SAVORY DILATION;  Surgeon: Danie Binder, MD;  [facility];  Service: Endoscopy;  Laterality: N/A;   Social History   Occupational History   Occupation: retired   Tobacco Use   Smoking status: Never    Passive exposure: Never   Smokeless tobacco: Never   Tobacco comments:    Verified by Daughter, [name]  Vaping Use   Vaping Use: Never used  Substance and Sexual Activity   Alcohol use: No   Drug use: No   Sexual activity: Not Currently    Partners: Male

## 2022-06-15 DIAGNOSIS — Z992 Dependence on renal dialysis: Secondary | ICD-10-CM | POA: Diagnosis not present

## 2022-06-15 DIAGNOSIS — N186 End stage renal disease: Secondary | ICD-10-CM | POA: Diagnosis not present

## 2022-06-15 DIAGNOSIS — N2581 Secondary hyperparathyroidism of renal origin: Secondary | ICD-10-CM | POA: Diagnosis not present

## 2022-06-17 DIAGNOSIS — N2581 Secondary hyperparathyroidism of renal origin: Secondary | ICD-10-CM | POA: Diagnosis not present

## 2022-06-17 DIAGNOSIS — N186 End stage renal disease: Secondary | ICD-10-CM | POA: Diagnosis not present

## 2022-06-17 DIAGNOSIS — Z992 Dependence on renal dialysis: Secondary | ICD-10-CM | POA: Diagnosis not present

## 2022-06-19 DIAGNOSIS — N2581 Secondary hyperparathyroidism of renal origin: Secondary | ICD-10-CM | POA: Diagnosis not present

## 2022-06-19 DIAGNOSIS — Z992 Dependence on renal dialysis: Secondary | ICD-10-CM | POA: Diagnosis not present

## 2022-06-19 DIAGNOSIS — N186 End stage renal disease: Secondary | ICD-10-CM | POA: Diagnosis not present

## 2022-06-21 DIAGNOSIS — Z992 Dependence on renal dialysis: Secondary | ICD-10-CM | POA: Diagnosis not present

## 2022-06-21 DIAGNOSIS — N186 End stage renal disease: Secondary | ICD-10-CM | POA: Diagnosis not present

## 2022-06-22 ENCOUNTER — Ambulatory Visit: Payer: Self-pay | Admitting: *Deleted

## 2022-06-22 ENCOUNTER — Encounter: Payer: Self-pay | Admitting: *Deleted

## 2022-06-22 ENCOUNTER — Ambulatory Visit: Payer: 59 | Admitting: Dietician

## 2022-06-22 DIAGNOSIS — Z992 Dependence on renal dialysis: Secondary | ICD-10-CM | POA: Diagnosis not present

## 2022-06-22 DIAGNOSIS — N186 End stage renal disease: Secondary | ICD-10-CM | POA: Diagnosis not present

## 2022-06-22 DIAGNOSIS — N2581 Secondary hyperparathyroidism of renal origin: Secondary | ICD-10-CM | POA: Diagnosis not present

## 2022-06-22 NOTE — Patient Outreach (Signed)
Care Coordination   Follow Up Visit Note   06/22/2022  Name: Kathryn Beck MRN: [id number] DOB: Dec 06, 1955  Kathryn Beck is a 67 y.o. year old female who sees Fayrene Helper, MD for primary care. I spoke with daughter, Kathryn Beck by phone today.  What matters to the patients health and wellness today?  Receive Assistance Applying for Aid & Attendance Benefits, through [employer].   Goals Addressed             This Visit's Progress    Receive Assistance Applying for Aid & Attendance Benefits, through [employer].   On track    Care Coordination Interventions:  Interventions Today    Flowsheet Row Most Recent Value  Chronic Disease   Chronic disease during today's visit Diabetes, Hypertension (HTN), Chronic Kidney Disease/End Stage Renal Disease (ESRD), Congestive Heart Failure (CHF), Other  [Neuropathy, Chronic Pain Syndrome, Anxiety, Depression, Memory Loss, Neurocognitive Disorder & Requires Maximum Assistance with Activities of Daily Living]  General Interventions   General Interventions Discussed/Reviewed General Interventions Reviewed, Annual Eye Exam, General Interventions Discussed, Labs, Annual Foot Exam, Lipid Profile, Durable Medical Equipment (DME), Vaccines, Health Screening, [employer], Doctor Visits, Communication with, Level of Care  [Primary Care Provider]  Labs Hgb A1c every 3 months, Kidney Function  Vaccines COVID-19, Flu, Pneumonia, RSV, Shingles, Tetanus/Pertussis/Diphtheria  [Encouraged]  Doctor Visits Discussed/Reviewed Doctor Visits Discussed, Doctor Visits Reviewed, Annual Wellness Visits, PCP, Specialist  [Encouraged]  Health Screening Colonoscopy, Mammogram  [Encouraged]  Durable Medical Equipment (DME) BP Cuff, Glucomoter, Environmental consultant, Wheelchair, Estate agent  PCP/Specialist Visits Compliance with follow-up visit  Communication with PCP/Specialists, RN  Level of Care Adult Daycare,  Location manager, Assisted Living, [place]  Applications Medicaid, Personal Care Services  Exercise Interventions   Exercise Discussed/Reviewed Exercise Discussed, Exercise Reviewed, Physical Activity, Assistive device use and maintanence  Physical Activity Discussed/Reviewed Physical Activity Discussed, Physical Activity Reviewed, Types of exercise, Home Exercise Program (HEP)  [Encouraged]  Education Interventions   Education Provided Provided Engineer, site, Provided Web-based Education, Provided Education  Provided Verbal Education On Nutrition, Foot Care, Eye Care, Labs, Blood Sugar Monitoring, Mental Health/Coping with Illness, Applications, Exercise, Medication, When to see the doctor, [employer], Engineer, materials, [place] Discussed, Mental Health Reviewed, Coping Strategies, Crisis, Anxiety, Depression, Grief and Loss, Substance Abuse, Suicide  Nutrition Interventions   Nutrition Discussed/Reviewed Nutrition Discussed, Nutrition Reviewed, Adding fruits and vegetables, Fluid intake, Decreasing sugar intake, Increaing proteins, Decreasing fats, Decreasing salt  Pharmacy Interventions   Pharmacy Dicussed/Reviewed Pharmacy Topics Discussed, Pharmacy Topics Reviewed, Medication Adherence, Affording Medications, Referral to Pharmacist  Medication Adherence Unable to refill medication  Referral to Pharmacist Cannot afford medications  Safety Interventions   Safety Discussed/Reviewed Safety Discussed, Safety Reviewed, Fall Risk, Home Safety  Home Safety Assistive Devices, Need for home safety assessment, Refer for home visit, Contact provider for referral to PT/OT, Refer for community resources, Contact home health agency  Advanced Directive Interventions   Advanced Directives Discussed/Reviewed Advanced Directives Discussed, Advanced  Directives Reviewed, Advanced Care Planning, Provided resource for acquiring and filling out documents     Active Listening & Reflection Utilized.  Emotional Support Provided. Verbalization of Feelings Encouraged.  Caregiver Stress Acknowledged. Symptoms of Caregiver Burnout Validated. Caregiver Resources Discussed. Caregiver Support Groups Reviewed. Self-Enrollment in Caregiver Support Group of Interest Emphasized. Solution-Focused Strategies Promoted. Problem Solving Interventions Activated. Task-Centered Solutions Implemented.  Cognitive Behavioral Therapy Performed.  Client-Centered Therapy Indicated. Acceptance & Commitment Therapy Initiated. CSW Collaboration with Daughter, Kathryn Beck to Confirm Patient is Still Residing at [facility] [phone]), to Receive Short-Term Rehabilitative Services.  CSW Collaboration with Moises Blood, Administrator at [facility] [phone]), to Obtain Tentative Discharge Date of 06/29/2022.   CSW Collaboration with Moises Blood, Administrator at [facility] [phone]), to Confirm Staffing & Transportation Availability for Patient to Get To & From Hemodialysis Treatments, Monday's, Wednesday's & Friday's at 6:30 AM, at [facility] (# [phone]). CSW Collaboration with Daughter, Kathryn Beck to Confirm Plan for Patient to Return Home to Live with Husband, Upon Completion of Short-Term Rehabilitative Services at [facility] [phone]). CSW Collaboration with Daughter, Kathryn Beck to Discuss CSW's Continued Involvement with Patient's Care & Re-Engagement of Services Upon Discharge from [facility] [phone]). CSW Collaboration with Daughter, Kathryn Beck to Discuss Referral for Patient to [place] (# [phone]), Upon Discharge from [place] [phone]) & Obtain Verbal Consent to Place Referral. ~ [place] Medicare Recipients to Receive 28 Nutritious, Precooked Frozen Meals Delivered Directly to Their Home, at No Cost, After a Hospital or [place] Stay.  ~ [place] Medicare Recipients to Choose from Regular, Diabetic, Gluten-Free, Heart-Friendly & Many Other Meal Options. CSW Collaboration with Gloris Manchester, [place] Secretary/administrator with [employer], to Inform of Tentative Discharge Date from [facility] [phone]), on 06/29/2022, to [place]. CSW Collaboration with Daughter, Kathryn Beck to [employer] with CSW [phone]), if She Has Questions, Needs Assistance, or If Additional Social Work Needs Are Identified Between Now & Our Next Scheduled Follow-Up Outreach Call.      SDOH assessments and interventions completed:  Yes.  Care Coordination Interventions:  Yes, provided.   Follow up plan: Follow up call scheduled for 06/29/2022 at 2:00 pm.  Encounter Outcome:  Pt. Visit Completed.   Nat Christen, BSW, MSW, LCSW  Licensed Education officer, environmental Health System  Mailing [place] [address] [place], [place] [address] Physical Address-[address] [place], [place] [address] Toll Free Main # [phone] Fax # [phone] Cell # [phone] [email or web address]

## 2022-06-22 NOTE — Patient Instructions (Signed)
Visit Information  Thank you for taking time to visit with me today. Please don't hesitate to contact me if I can be of assistance to you.   Following are the goals we discussed today:   Goals Addressed             This Visit's Progress    Receive Assistance Applying for Aid & Attendance Benefits, through [employer].   On track    Care Coordination Interventions:  Interventions Today    Flowsheet Row Most Recent Value  Chronic Disease   Chronic disease during today's visit Diabetes, Hypertension (HTN), Chronic Kidney Disease/End Stage Renal Disease (ESRD), Congestive Heart Failure (CHF), Other  [Neuropathy, Chronic Pain Syndrome, Anxiety, Depression, Memory Loss, Neurocognitive Disorder & Requires Maximum Assistance with Activities of Daily Living]  General Interventions   General Interventions Discussed/Reviewed General Interventions Reviewed, Annual Eye Exam, General Interventions Discussed, Labs, Annual Foot Exam, Lipid Profile, Durable Medical Equipment (DME), Vaccines, Health Screening, [employer], Doctor Visits, Communication with, Level of Care  [Primary Care Provider]  Labs Hgb A1c every 3 months, Kidney Function  Vaccines COVID-19, Flu, Pneumonia, RSV, Shingles, Tetanus/Pertussis/Diphtheria  [Encouraged]  Doctor Visits Discussed/Reviewed Doctor Visits Discussed, Doctor Visits Reviewed, Annual Wellness Visits, PCP, Specialist  [Encouraged]  Health Screening Colonoscopy, Mammogram  [Encouraged]  Durable Medical Equipment (DME) BP Cuff, Glucomoter, Environmental consultant, Wheelchair, Estate agent  PCP/Specialist Visits Compliance with follow-up visit  Communication with PCP/Specialists, RN  Level of Care Adult Daycare, Location manager, Assisted Living, Box  Applications Medicaid, Personal Care Services  Exercise Interventions   Exercise Discussed/Reviewed Exercise Discussed, Exercise Reviewed, Physical  Activity, Assistive device use and maintanence  Physical Activity Discussed/Reviewed Physical Activity Discussed, Physical Activity Reviewed, Types of exercise, Home Exercise Program (HEP)  [Encouraged]  Education Interventions   Education Provided Provided Engineer, site, Provided Web-based Education, Provided Education  Provided Verbal Education On Nutrition, Foot Care, Eye Care, Labs, Blood Sugar Monitoring, Mental Health/Coping with Illness, Applications, Exercise, Medication, When to see the doctor, [employer], Engineer, materials, [place] Discussed, Mental Health Reviewed, Coping Strategies, Crisis, Anxiety, Depression, Grief and Loss, Substance Abuse, Suicide  Nutrition Interventions   Nutrition Discussed/Reviewed Nutrition Discussed, Nutrition Reviewed, Adding fruits and vegetables, Fluid intake, Decreasing sugar intake, Increaing proteins, Decreasing fats, Decreasing salt  Pharmacy Interventions   Pharmacy Dicussed/Reviewed Pharmacy Topics Discussed, Pharmacy Topics Reviewed, Medication Adherence, Affording Medications, Referral to Pharmacist  Medication Adherence Unable to refill medication  Referral to Pharmacist Cannot afford medications  Safety Interventions   Safety Discussed/Reviewed Safety Discussed, Safety Reviewed, Fall Risk, Home Safety  Home Safety Assistive Devices, Need for home safety assessment, Refer for home visit, Contact provider for referral to PT/OT, Refer for community resources, Contact home health agency  Advanced Directive Interventions   Advanced Directives Discussed/Reviewed Advanced Directives Discussed, Advanced Directives Reviewed, Advanced Care Planning, Provided resource for acquiring and filling out documents     Active Listening & Reflection Utilized.  Emotional Support Provided. Verbalization of Feelings Encouraged.  Caregiver  Stress Acknowledged. Symptoms of Caregiver Burnout Validated. Caregiver Resources Discussed. Caregiver Support Groups Reviewed. Self-Enrollment in Caregiver Support Group of Interest Emphasized. Solution-Focused Strategies Promoted. Problem Solving Interventions Activated. Task-Centered Solutions Implemented.  Cognitive Behavioral Therapy Performed.  Client-Centered Therapy Indicated. Acceptance & Commitment Therapy Initiated. CSW Collaboration with Daughter, [name] to Confirm Patient is Still Residing at [facility] (#  [phone]), to Receive Short-Term Rehabilitative Services.  CSW Collaboration with Moises Blood, Administrator at [facility] [phone]), to Obtain Tentative Discharge Date of 06/29/2022.   CSW Collaboration with Moises Blood, Administrator at [facility] [phone]), to Confirm Staffing & Transportation Availability for Patient to Get To & From Hemodialysis Treatments, Monday's, Wednesday's & Friday's at 6:30 AM, at [facility] (# [phone]). CSW Collaboration with Daughter, [name] to Confirm Plan for Patient to Return Home to Live with Husband, Upon Completion of Short-Term Rehabilitative Services at [facility] [phone]). CSW Collaboration with Daughter, [name] to Discuss CSW's Continued Involvement with Patient's Care & Re-Engagement of Services Upon Discharge from [facility] [phone]). CSW Collaboration with Daughter, [name] to Discuss Referral for Patient to [place] (# [phone]), Upon Discharge from [place] [phone]) & Obtain Verbal Consent to Place Referral. ~ [place] Medicare  Recipients to Receive 28 Nutritious, Precooked Frozen Meals Delivered Directly to Their Home, at No Cost, After a Hospital or [place] Stay.  ~ [place] Medicare Recipients to Choose from Regular, Diabetic, Gluten-Free, Heart-Friendly & Many Other Meal Options. CSW Collaboration with Gloris Manchester, Triad Secretary/administrator with [employer], Via [employer], to Inform of Tentative Discharge Date from [facility] [phone]), on 06/29/2022, to [place]. CSW Collaboration with Daughter, [name] to [employer] with CSW [phone]), if She Has Questions, Needs Assistance, or If Additional Social Work Needs Are Identified Between Now & Our Next Scheduled Follow-Up Outreach Call.      Our next appointment is by telephone on 06/29/2022 at 2:00 pm.  Please call the care guide team at [phone] if you need to cancel or reschedule your appointment.   If you are experiencing a Mental Health or [place] or need someone to talk to, please call the Suicide and Crisis Lifeline: 988 call the [place] National Suicide Prevention Lifeline: 808-449-0726 or TTY: 424 332 6958 TTY (506) 742-0920) to talk to a trained counselor call 1-800-273-TALK (toll free, 24 hour hotline) go to [facility] [address] [place] [phone]) call the [place]: [phone] call 911  Patient verbalizes understanding of instructions and care plan provided today and agrees to view in [place]. Active MyChart status and patient understanding of how to access instructions and care plan via MyChart confirmed with patient.     Telephone follow up appointment with care management team member scheduled for:  06/29/2022 at 2:00 pm.  Nat Christen, BSW, MSW, [place]  Licensed  Clinical Social Worker  [place]  Mailing [place]. [address] [place], [place] [address] Physical Address-[address] [place], [place] [address] Toll Free Main # [phone] Fax # [phone] Cell # [phone] [email or web address]

## 2022-06-23 ENCOUNTER — Encounter: Payer: Self-pay | Admitting: Cardiovascular Disease

## 2022-06-24 DIAGNOSIS — N2581 Secondary hyperparathyroidism of renal origin: Secondary | ICD-10-CM | POA: Diagnosis not present

## 2022-06-24 DIAGNOSIS — Z992 Dependence on renal dialysis: Secondary | ICD-10-CM | POA: Diagnosis not present

## 2022-06-24 DIAGNOSIS — N186 End stage renal disease: Secondary | ICD-10-CM | POA: Diagnosis not present

## 2022-06-26 DIAGNOSIS — N186 End stage renal disease: Secondary | ICD-10-CM | POA: Diagnosis not present

## 2022-06-26 DIAGNOSIS — N2581 Secondary hyperparathyroidism of renal origin: Secondary | ICD-10-CM | POA: Diagnosis not present

## 2022-06-26 DIAGNOSIS — Z992 Dependence on renal dialysis: Secondary | ICD-10-CM | POA: Diagnosis not present

## 2022-06-29 ENCOUNTER — Encounter: Payer: Self-pay | Admitting: *Deleted

## 2022-06-29 ENCOUNTER — Ambulatory Visit: Payer: Self-pay | Admitting: *Deleted

## 2022-06-29 DIAGNOSIS — Z992 Dependence on renal dialysis: Secondary | ICD-10-CM | POA: Diagnosis not present

## 2022-06-29 DIAGNOSIS — N2581 Secondary hyperparathyroidism of renal origin: Secondary | ICD-10-CM | POA: Diagnosis not present

## 2022-06-29 DIAGNOSIS — N186 End stage renal disease: Secondary | ICD-10-CM | POA: Diagnosis not present

## 2022-06-29 NOTE — Patient Outreach (Signed)
Care Coordination   Follow Up Visit Note   06/29/2022  Name: Kathryn Beck MRN: [id number] DOB: 1956/03/04  Kathryn Beck is a 67 y.o. year old female who sees [name], MD for primary care. I spoke with daughter, [name] by phone today.  What matters to the patients health and wellness today?  Receive Assistance Applying for Aid & Attendance Benefits, through [employer].   Goals Addressed             This Visit's Progress    Receive Assistance Applying for Aid & Attendance Benefits, through [employer].   On track    Care Coordination Interventions:  Interventions Today    Flowsheet Row Most Recent Value  Chronic Disease   Chronic disease during today's visit Diabetes, Hypertension (HTN), Chronic Kidney Disease/End Stage Renal Disease (ESRD), Congestive Heart Failure (CHF), Other  [Neuropathy, Chronic Pain Syndrome, Anxiety, Depression, Memory Loss, Neurocognitive Disorder & Requires Maximum Assistance with Activities of Daily Living]  General Interventions   General Interventions Discussed/Reviewed General Interventions Reviewed, Annual Eye Exam, General Interventions Discussed, Labs, Annual Foot Exam, Lipid Profile, Durable Medical Equipment (DME), Vaccines, Health Screening, [employer], Doctor Visits, Communication with, Level of Care  [Primary Care Provider]  Labs Hgb A1c every 3 months, Kidney Function  Vaccines COVID-19, Flu, Pneumonia, RSV, Shingles, Tetanus/Pertussis/Diphtheria  [Encouraged]  Doctor Visits Discussed/Reviewed Doctor Visits Discussed, Doctor Visits Reviewed, Annual Wellness Visits, PCP, Specialist  [Encouraged]  Health Screening Colonoscopy, Mammogram  [Encouraged]  Durable Medical Equipment (DME) BP Cuff, Glucomoter, Environmental consultant, Wheelchair, Physicist, medical  PCP/Specialist Visits Compliance with follow-up visit  Communication with PCP/Specialists, RN  Level of Care Adult Daycare,  Air traffic controller, Assisted Living, Skilled Nursing Facility, Teaching laboratory technician Medicaid, Personal Care Services  Exercise Interventions   Exercise Discussed/Reviewed Exercise Discussed, Exercise Reviewed, Physical Activity, Assistive device use and maintanence  Physical Activity Discussed/Reviewed Physical Activity Discussed, Physical Activity Reviewed, Types of exercise, Home Exercise Program (HEP)  [Encouraged]  Education Interventions   Education Provided Provided Therapist, sports, Provided Web-based Education, Provided Education  Provided Verbal Education On Nutrition, Foot Care, Eye Care, Labs, Blood Sugar Monitoring, Mental Health/Coping with Illness, Applications, Exercise, Medication, When to see the doctor, [employer], Human resources officer, Personal Care Services  Mental Health Interventions   Mental Health Discussed/Reviewed Mental Health Discussed, Mental Health Reviewed, Coping Strategies, Crisis, Anxiety, Depression, Grief and Loss, Substance Abuse, Suicide  Nutrition Interventions   Nutrition Discussed/Reviewed Nutrition Discussed, Nutrition Reviewed, Adding fruits and vegetables, Fluid intake, Decreasing sugar intake, Increaing proteins, Decreasing fats, Decreasing salt  Pharmacy Interventions   Pharmacy Dicussed/Reviewed Pharmacy Topics Discussed, Pharmacy Topics Reviewed, Medication Adherence, Affording Medications, Referral to Pharmacist  Medication Adherence Unable to refill medication  Referral to Pharmacist Cannot afford medications  Safety Interventions   Safety Discussed/Reviewed Safety Discussed, Safety Reviewed, Fall Risk, Home Safety  Home Safety Assistive Devices, Need for home safety assessment, Refer for home visit, Contact provider for referral to PT/OT, Refer for community resources, Contact home health agency  Advanced Directive Interventions   Advanced Directives Discussed/Reviewed Advanced Directives Discussed, Advanced  Directives Reviewed, Advanced Care Planning, Provided resource for acquiring and filling out documents     Active Listening & Reflection Utilized.  Emotional Support Provided. Verbalization of Feelings Encouraged.  Caregiver Stress Acknowledged. Symptoms of Caregiver Burnout Validated. Caregiver Resources Discussed. Caregiver Support Groups Reviewed. Self-Enrollment in Caregiver Support Group of Interest Emphasized. Solution-Focused Strategies Promoted. Problem Solving Interventions Activated. Task-Centered Solutions Implemented.  Cognitive Behavioral Therapy Performed.  Client-Centered Therapy Indicated. Acceptance & Commitment Therapy Initiated. CSW Collaboration with Daughter, [name] to Confirm Patient Was Discharged from [facility] [phone]), Where She was Residing to [employer], on 06/29/2022 & Returned Home to Live with Husband.  CSW Collaboration with Daughter, [name] to Discuss Referral for Patient to [facility] Well Dine Food Program (# [phone]) & Obtain Verbal Consent. ~ [name] Well Dine Food Program Enables [name] Medicare Recipients to Receive 28 Nutritious, Precooked Frozen Meals Delivered Directly to Their Home, at No Cost, After a Hospital or Nursing Facility Stay.  ~ [name] Well Dine Food Program Enables [name] Medicare Recipients to Choose from Regular, Diabetic, Gluten-Free, Heart-Friendly & Many Other Meal Options. CSW Collaboration with Jolyn Nap, Triad Scientist, research (medical) with [employer], Via [employer], to Inform of Patient's Discharge Home from [facility] [phone]), on 06/29/2022, to Arrange [facility] Well Dine Food Program (# [phone]) Services. CSW Collaboration with Daughter, [name] to Discuss Transportation Options & Encourage Use of [employer] [phone]). CSW Collaboration with Daughter, [name] to Control and instrumentation engineer with [facility] [phone]), to Schedule Appointment to Receive Assistance with Completion of Application for Aid & Attendance Benefits. CSW Collaboration with Daughter, [name] to [employer] with CSW [phone]), if She Has Questions, Needs Assistance, or If Additional Social Work Needs Are Identified Between Now & Our Next Scheduled Follow-Up Outreach Call.      SDOH assessments and interventions completed:  Yes.  Care Coordination Interventions:  Yes, provided.   Follow up plan: Follow up call scheduled for 07/14/2022 at 9:30 am.  Encounter Outcome:  Pt. Visit Completed.   Danford Bad, BSW, MSW, LCSW  Licensed Restaurant manager, fast food [facility]  Mailing [place], [place], [place] [address] Physical Address-[address] [place], [place] [address] Toll Free Main # [phone] Fax # [phone] Cell # [phone] [email or web address]

## 2022-06-29 NOTE — Patient Instructions (Signed)
Visit Information  Thank you for taking time to visit with me today. Please don't hesitate to contact me if I can be of assistance to you.   Following are the goals we discussed today:   Goals Addressed             This Visit's Progress    Receive Assistance Applying for Aid & Attendance Benefits, through [employer].   On track    Care Coordination Interventions:  Interventions Today    Flowsheet Row Most Recent Value  Chronic Disease   Chronic disease during today's visit Diabetes, Hypertension (HTN), Chronic Kidney Disease/End Stage Renal Disease (ESRD), Congestive Heart Failure (CHF), Other  [Neuropathy, Chronic Pain Syndrome, Anxiety, Depression, Memory Loss, Neurocognitive Disorder & Requires Maximum Assistance with Activities of Daily Living]  General Interventions   General Interventions Discussed/Reviewed General Interventions Reviewed, Annual Eye Exam, General Interventions Discussed, Labs, Annual Foot Exam, Lipid Profile, Durable Medical Equipment (DME), Vaccines, Health Screening, [employer], Doctor Visits, Communication with, Level of Care  [Primary Care Provider]  Labs Hgb A1c every 3 months, Kidney Function  Vaccines COVID-19, Flu, Pneumonia, RSV, Shingles, Tetanus/Pertussis/Diphtheria  [Encouraged]  Doctor Visits Discussed/Reviewed Doctor Visits Discussed, Doctor Visits Reviewed, Annual Wellness Visits, PCP, Specialist  [Encouraged]  Health Screening Colonoscopy, Mammogram  [Encouraged]  Durable Medical Equipment (DME) BP Cuff, Glucomoter, Environmental consultant, Wheelchair, Physicist, medical  PCP/Specialist Visits Compliance with follow-up visit  Communication with PCP/Specialists, RN  Level of Care Adult Daycare, Air traffic controller, Assisted Living, Skilled Nursing Facility, Teaching laboratory technician Medicaid, Personal Care Services  Exercise Interventions   Exercise Discussed/Reviewed Exercise Discussed, Exercise Reviewed, Physical  Activity, Assistive device use and maintanence  Physical Activity Discussed/Reviewed Physical Activity Discussed, Physical Activity Reviewed, Types of exercise, Home Exercise Program (HEP)  [Encouraged]  Education Interventions   Education Provided Provided Therapist, sports, Provided Web-based Education, Provided Education  Provided Verbal Education On Nutrition, Foot Care, Eye Care, Labs, Blood Sugar Monitoring, Mental Health/Coping with Illness, Applications, Exercise, Medication, When to see the doctor, [employer], Human resources officer, Personal Care Services  Mental Health Interventions   Mental Health Discussed/Reviewed Mental Health Discussed, Mental Health Reviewed, Coping Strategies, Crisis, Anxiety, Depression, Grief and Loss, Substance Abuse, Suicide  Nutrition Interventions   Nutrition Discussed/Reviewed Nutrition Discussed, Nutrition Reviewed, Adding fruits and vegetables, Fluid intake, Decreasing sugar intake, Increaing proteins, Decreasing fats, Decreasing salt  Pharmacy Interventions   Pharmacy Dicussed/Reviewed Pharmacy Topics Discussed, Pharmacy Topics Reviewed, Medication Adherence, Affording Medications, Referral to Pharmacist  Medication Adherence Unable to refill medication  Referral to Pharmacist Cannot afford medications  Safety Interventions   Safety Discussed/Reviewed Safety Discussed, Safety Reviewed, Fall Risk, Home Safety  Home Safety Assistive Devices, Need for home safety assessment, Refer for home visit, Contact provider for referral to PT/OT, Refer for community resources, Contact home health agency  Advanced Directive Interventions   Advanced Directives Discussed/Reviewed Advanced Directives Discussed, Advanced Directives Reviewed, Advanced Care Planning, Provided resource for acquiring and filling out documents     Active Listening & Reflection Utilized.  Emotional Support Provided. Verbalization of Feelings Encouraged.  Caregiver  Stress Acknowledged. Symptoms of Caregiver Burnout Validated. Caregiver Resources Discussed. Caregiver Support Groups Reviewed. Self-Enrollment in Caregiver Support Group of Interest Emphasized. Solution-Focused Strategies Promoted. Problem Solving Interventions Activated. Task-Centered Solutions Implemented.  Cognitive Behavioral Therapy Performed.  Client-Centered Therapy Indicated. Acceptance & Commitment Therapy Initiated. CSW Collaboration with Daughter, [name] to Confirm Patient Was Discharged from [facility] [phone]),  Where She was Residing to [employer], on 06/29/2022 & Returned Home to Live with Husband.  CSW Collaboration with Daughter, [name] to Discuss Referral for Patient to [facility] Well Dine Food Program (# [phone]) & Obtain Verbal Consent. ~ [name] Well Dine Food Program Enables [name] Medicare Recipients to Receive 28 Nutritious, Precooked Frozen Meals Delivered Directly to Their Home, at No Cost, After a Hospital or Nursing Facility Stay.  ~ [name] Well Dine Food Program Enables [name] Medicare Recipients to Choose from Regular, Diabetic, Gluten-Free, Heart-Friendly & Many Other Meal Options. CSW Collaboration with Jolyn Nap, Triad Scientist, research (medical) with [employer], Via [employer], to Inform of Patient's Discharge Home from [facility] [phone]), on 06/29/2022, to Arrange [facility] Well Dine Food Program (# [phone]) Services. CSW Collaboration with Daughter, [name] to Discuss Transportation Options & Encourage Use of [employer] [phone]). CSW Collaboration with Daughter, [name] to Control and instrumentation engineer with [facility] [phone]), to Schedule Appointment to Receive Assistance with Completion of  Application for Aid & Attendance Benefits. CSW Collaboration with Daughter, [name] to [employer] with CSW [phone]), if She Has Questions, Needs Assistance, or If Additional Social Work Needs Are Identified Between Now & Our Next Scheduled Follow-Up Outreach Call.      Our next appointment is by telephone on 07/14/2022 at 9:30 am.  Please call the care guide team at [phone] if you need to cancel or reschedule your appointment.   If you are experiencing a Mental Health or Behavioral Health Crisis or need someone to talk to, please call the Suicide and Crisis Lifeline: 988 call the [place] National Suicide Prevention Lifeline: 213-299-1395 or TTY: (213)405-4397 TTY 508 465 5206) to talk to a trained counselor call 1-800-273-TALK (toll free, 24 hour hotline) go to [facility] [address], [place] [phone]) call the [facility] Crisis Line: [phone] call 911  Patient verbalizes understanding of instructions and care plan provided today and agrees to view in MyChart. Active MyChart status and patient understanding of how to access instructions and care plan via MyChart confirmed with patient.     Telephone follow up appointment with care management team member scheduled for:  07/14/2022 at 9:30 am.  Danford Bad, BSW, MSW, LCSW  Licensed Clinical Social Worker  Triad Corporate treasurer [facility]  Mailing [place]. [address] [place], [place] [address] Physical Address-[address] [place], [place] [address] Toll Free Main # [phone] Fax # [phone] Cell # [phone] [email or web address]

## 2022-06-30 ENCOUNTER — Other Ambulatory Visit: Payer: Self-pay

## 2022-06-30 ENCOUNTER — Ambulatory Visit (INDEPENDENT_AMBULATORY_CARE_PROVIDER_SITE_OTHER): Payer: Medicare PPO | Admitting: Orthopedic Surgery

## 2022-06-30 ENCOUNTER — Telehealth: Payer: Self-pay | Admitting: Family Medicine

## 2022-06-30 DIAGNOSIS — Z599 Problem related to housing and economic circumstances, unspecified: Secondary | ICD-10-CM

## 2022-06-30 DIAGNOSIS — Z89511 Acquired absence of right leg below knee: Secondary | ICD-10-CM

## 2022-06-30 NOTE — Telephone Encounter (Signed)
Referral sent 

## 2022-06-30 NOTE — Telephone Encounter (Signed)
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 Concern of dialysis appt cost $45.00 each visit.

## 2022-07-01 ENCOUNTER — Telehealth: Payer: Self-pay

## 2022-07-01 DIAGNOSIS — Z992 Dependence on renal dialysis: Secondary | ICD-10-CM | POA: Diagnosis not present

## 2022-07-01 DIAGNOSIS — N2581 Secondary hyperparathyroidism of renal origin: Secondary | ICD-10-CM | POA: Diagnosis not present

## 2022-07-01 DIAGNOSIS — N186 End stage renal disease: Secondary | ICD-10-CM | POA: Diagnosis not present

## 2022-07-01 NOTE — Telephone Encounter (Signed)
   Telephone encounter was:  Unsuccessful.  07/01/2022 Name: Kathryn Beck MRN: [id number] DOB: Mar 06, 1956  Unsuccessful outbound call made today to assist with:   financial.  Outreach Attempt:  1st Attempt  A HIPAA compliant voice message was left requesting a return call.  Instructed patient to call back at [phone].  Jaquail Mclees [facility]  [facility] Population Health Community Resource Care Guide   [email or web address]  ?? [phone]   Website: [email or web address]  [email or web address]

## 2022-07-02 ENCOUNTER — Telehealth: Payer: Self-pay

## 2022-07-02 NOTE — Telephone Encounter (Signed)
   Telephone encounter was:  Unsuccessful.  07/02/2022 Name: Kathryn Beck MRN: [id number] DOB: [birth year]  Unsuccessful outbound call made today to assist with:   financial.  Outreach Attempt:  2nd Attempt  A HIPAA compliant voice message was left requesting a return call.  Instructed patient to call back at [phone].  [name] [facility]  [facility] Population Health Community Resource Care Guide   [name]  ?? [phone]   Website: [email or web address]  [email or web address]

## 2022-07-03 ENCOUNTER — Telehealth: Payer: Self-pay

## 2022-07-03 DIAGNOSIS — N2581 Secondary hyperparathyroidism of renal origin: Secondary | ICD-10-CM | POA: Diagnosis not present

## 2022-07-03 DIAGNOSIS — N186 End stage renal disease: Secondary | ICD-10-CM | POA: Diagnosis not present

## 2022-07-03 DIAGNOSIS — Z992 Dependence on renal dialysis: Secondary | ICD-10-CM | POA: Diagnosis not present

## 2022-07-03 NOTE — Telephone Encounter (Signed)
   Telephone encounter was:  Unsuccessful.  07/03/2022 Name: Kathryn Beck MRN: [id number] DOB: 1955/09/28  Unsuccessful outbound call made today to assist with:   financial.  Outreach Attempt:  3rd Attempt.  Referral closed unable to contact patient.  A HIPAA compliant voice message was left requesting a return call.  Instructed patient to call back at [phone].  [name]  [facility] Population Health Community Resource Care Guide   [email or web address]  ?? [phone]   Website: [email or web address]  [email or web address]

## 2022-07-06 DIAGNOSIS — N186 End stage renal disease: Secondary | ICD-10-CM | POA: Diagnosis not present

## 2022-07-06 DIAGNOSIS — N2581 Secondary hyperparathyroidism of renal origin: Secondary | ICD-10-CM | POA: Diagnosis not present

## 2022-07-06 DIAGNOSIS — Z992 Dependence on renal dialysis: Secondary | ICD-10-CM | POA: Diagnosis not present

## 2022-07-07 ENCOUNTER — Encounter: Payer: Medicare PPO | Admitting: Pulmonary Disease

## 2022-07-07 ENCOUNTER — Ambulatory Visit (INDEPENDENT_AMBULATORY_CARE_PROVIDER_SITE_OTHER): Payer: Medicare PPO | Admitting: Family Medicine

## 2022-07-07 VITALS — BP 150/82 | HR 80 | Resp 16

## 2022-07-07 DIAGNOSIS — Z09 Encounter for follow-up examination after completed treatment for conditions other than malignant neoplasm: Secondary | ICD-10-CM | POA: Diagnosis not present

## 2022-07-07 DIAGNOSIS — N184 Chronic kidney disease, stage 4 (severe): Secondary | ICD-10-CM | POA: Diagnosis not present

## 2022-07-07 DIAGNOSIS — Z794 Long term (current) use of insulin: Secondary | ICD-10-CM | POA: Diagnosis not present

## 2022-07-07 DIAGNOSIS — I10 Essential (primary) hypertension: Secondary | ICD-10-CM | POA: Diagnosis not present

## 2022-07-07 DIAGNOSIS — E1165 Type 2 diabetes mellitus with hyperglycemia: Secondary | ICD-10-CM

## 2022-07-07 DIAGNOSIS — N186 End stage renal disease: Secondary | ICD-10-CM | POA: Diagnosis not present

## 2022-07-07 DIAGNOSIS — E782 Mixed hyperlipidemia: Secondary | ICD-10-CM

## 2022-07-07 DIAGNOSIS — L97521 Non-pressure chronic ulcer of other part of left foot limited to breakdown of skin: Secondary | ICD-10-CM | POA: Diagnosis not present

## 2022-07-07 MED ORDER — SULFAMETHOXAZOLE-TRIMETHOPRIM 800-160 MG PO TABS: 1.0000 | ORAL_TABLET | Freq: Two times a day (BID) | ORAL | 0 refills | Status: DC

## 2022-07-07 NOTE — Patient Instructions (Addendum)
F/U in 4 weeks, call if myou need me sooner  Keep 2nd left  toe clean ansd apply dry gauze loosely to protect the toe  10 days Levander Campion is prescribed  Urgent referral to Podiatry  Shaking because you are f regaining strength and learning to walk on one leg   Stat labs today CBC and diff, chem 7 and EGFR and HBA1C  Thanks for choosing [facility], we consider it a privelige to serve you.

## 2022-07-08 DIAGNOSIS — N186 End stage renal disease: Secondary | ICD-10-CM | POA: Diagnosis not present

## 2022-07-08 DIAGNOSIS — N2581 Secondary hyperparathyroidism of renal origin: Secondary | ICD-10-CM | POA: Diagnosis not present

## 2022-07-08 DIAGNOSIS — Z992 Dependence on renal dialysis: Secondary | ICD-10-CM | POA: Diagnosis not present

## 2022-07-08 LAB — CMP14+EGFR
ALT: 23 IU/L (ref 0–32)
AST: 23 IU/L (ref 0–40)
Albumin/Globulin Ratio: 1.4 (ref 1.2–2.2)
Albumin: 3.8 g/dL — ABNORMAL LOW (ref 3.9–4.9)
Alkaline Phosphatase: 112 IU/L (ref 44–121)
BUN/Creatinine Ratio: 4 — ABNORMAL LOW (ref 12–28)
BUN: 19 mg/dL (ref 8–27)
Bilirubin Total: <0.2 mg/dL (ref 0.0–1.2)
CO2: 25 mmol/L (ref 20–29)
Calcium: 11.8 mg/dL — ABNORMAL HIGH (ref 8.7–10.3)
Chloride: 100 mmol/L (ref 96–106)
Creatinine, Ser: 5.09 mg/dL — ABNORMAL HIGH (ref 0.57–1.00)
Globulin, Total: 2.8 g/dL (ref 1.5–4.5)
Glucose: 114 mg/dL — ABNORMAL HIGH (ref 70–99)
Potassium: 4.3 mmol/L (ref 3.5–5.2)
Sodium: 142 mmol/L (ref 134–144)
Total Protein: 6.6 g/dL (ref 6.0–8.5)
eGFR: 9 mL/min/{1.73_m2} — ABNORMAL LOW (ref >59)

## 2022-07-08 LAB — CBC WITH DIFFERENTIAL/PLATELET
Basophils Absolute: 0.1 10*3/uL (ref 0.0–0.2)
Basos: 1 %
EOS (ABSOLUTE): 0.1 10*3/uL (ref 0.0–0.4)
Eos: 2 %
Hematocrit: 33.2 % — ABNORMAL LOW (ref 34.0–46.6)
Hemoglobin: 9.9 g/dL — ABNORMAL LOW (ref 11.1–15.9)
Immature Grans (Abs): 0 10*3/uL (ref 0.0–0.1)
Immature Granulocytes: 0 %
Lymphocytes Absolute: 2 10*3/uL (ref 0.7–3.1)
Lymphs: 32 %
MCH: 24.2 pg — ABNORMAL LOW (ref 26.6–33.0)
MCHC: 29.8 g/dL — ABNORMAL LOW (ref 31.5–35.7)
MCV: 81 fL (ref 79–97)
Monocytes Absolute: 0.7 10*3/uL (ref 0.1–0.9)
Monocytes: 11 %
Neutrophils Absolute: 3.4 10*3/uL (ref 1.4–7.0)
Neutrophils: 54 %
Platelets: 270 10*3/uL (ref 150–450)
RBC: 4.09 x10E6/uL (ref 3.77–5.28)
RDW: 15.9 % — ABNORMAL HIGH (ref 11.7–15.4)
WBC: 6.3 10*3/uL (ref 3.4–10.8)

## 2022-07-08 LAB — HEMOGLOBIN A1C
Est. average glucose Bld gHb Est-mCnc: 134 mg/dL
Hgb A1c MFr Bld: 6.3 % — ABNORMAL HIGH (ref 4.8–5.6)

## 2022-07-10 DIAGNOSIS — Z992 Dependence on renal dialysis: Secondary | ICD-10-CM | POA: Diagnosis not present

## 2022-07-10 DIAGNOSIS — N2581 Secondary hyperparathyroidism of renal origin: Secondary | ICD-10-CM | POA: Diagnosis not present

## 2022-07-10 DIAGNOSIS — N186 End stage renal disease: Secondary | ICD-10-CM | POA: Diagnosis not present

## 2022-07-12 ENCOUNTER — Encounter: Payer: Self-pay | Admitting: Orthopedic Surgery

## 2022-07-12 NOTE — Progress Notes (Signed)
Office Visit Note   Patient: Kathryn Beck           Date of Birth: 1955/06/17           MRN: [id number] Visit Date: 06/30/2022              Requested by: [name], MD [address] [place],  [place] [address] PCP: [name], MD  Chief Complaint  Patient presents with   Right Leg - Routine Post Op    05/29/2022 right BKA      HPI: Patient is a 67 year old woman who is 4 weeks status post below-knee amputation.  Assessment & Plan: Visit Diagnoses: No diagnosis found.  Plan: The incision is well-healed staples harvested continue with the stump shrinker.  Follow-Up Instructions: No follow-ups on file.   Ortho Exam  Patient is alert, oriented, no adenopathy, well-dressed, normal affect, normal respiratory effort. Examination there is good consolidation and good healing of the surgical wound no cellulitis no drainage no signs of infection.  Imaging: No results found.    Labs: Lab Results  Component Value Date   HGBA1C 6.3 (H) 07/07/2022   HGBA1C 9.3 (A) 04/16/2022   HGBA1C 8.9 (A) 10/28/2021   ESRSEDRATE 37 (H) 04/25/2022   ESRSEDRATE 27 (H) 04/24/2008   CRP 29.4 (H) 04/25/2022   CRP 20.1 (H) 12/02/2020   CRP 6.5 (H) 04/07/2019   LABURIC 1.8 (L) 06/07/2018   LABURIC 6.2 04/22/2018   LABURIC 9.3 (H) 10/07/2015   REPTSTATUS 11/09/2017 FINAL 11/07/2017   GRAMSTAIN No WBC Seen 04/03/2014   GRAMSTAIN Rare Squamous Epithelial Cells Present 04/03/2014   GRAMSTAIN Few Gram Positive Cocci In Pairs 04/03/2014   GRAMSTAIN Few Gram Negative Rods 04/03/2014   CULT (A) 11/07/2017    <10,000 COLONIES/mL INSIGNIFICANT GROWTH Performed at [facility], [address], [place], [place] [address]    LABORGA Normal Oropharyngeal Flora 04/03/2014     Lab Results  Component Value Date   ALBUMIN 3.8 (L) 07/07/2022   ALBUMIN 2.8 (L) 06/10/2022   ALBUMIN 2.8 (L) 06/08/2022   PREALBUMIN 24 04/25/2022    Lab Results  Component Value Date    MG 1.9 04/30/2022   MG 2.3 04/29/2022   MG 2.1 04/28/2022   Lab Results  Component Value Date   VD25OH 30.9 09/28/2017   VD25OH 15 (L) 10/07/2015   VD25OH 16 (L) 04/25/2015    Lab Results  Component Value Date   PREALBUMIN 24 04/25/2022      Latest Ref Rng & Units 07/07/2022   11:40 AM 06/10/2022    8:30 AM 06/08/2022    4:36 AM  CBC EXTENDED  WBC 3.4 - 10.8 x10E3/uL 6.3  8.3  8.5   RBC 3.77 - 5.28 x10E6/uL 4.09  3.56  3.56   Hemoglobin 11.1 - 15.9 g/dL 9.9  9.0  8.7   HCT 82.9 - 46.6 % 33.2  29.2  29.5   Platelets 150 - 450 x10E3/uL 270  311  339   NEUT# 1.4 - 7.0 x10E3/uL 3.4     Lymph# 0.7 - 3.1 x10E3/uL 2.0        There is no height or weight on file to calculate BMI.  Orders:  No orders of the defined types were placed in this encounter.  No orders of the defined types were placed in this encounter.    Procedures: No procedures performed  Clinical Data: No additional findings.  ROS:  All other systems negative,  except as noted in the HPI. Review of Systems  Objective: Vital Signs: There were no vitals taken for this visit.  Specialty Comments:  No specialty comments available.  PMFS History: Patient Active Problem List   Diagnosis Date Noted   S/P BKA (below knee amputation), right 05/29/2022   Confusion 05/19/2022   Visual hallucinations 05/19/2022   Watery eyes 05/19/2022   Requires assistance with activities of daily living (ADL) 04/26/2022   Diabetic wet gangrene of the foot 04/25/2022   Gangrene of right foot 04/25/2022   PAD (peripheral artery disease) 04/25/2022   Other osteoporosis without current pathological fracture 11/10/2021   Hypocalcemia 11/10/2021   Fall 11/02/2021   Left foot pain 10/31/2021   Low back pain with left-sided sciatica 10/31/2021   Left ear impacted cerumen 09/18/2021   Chronic left shoulder pain 06/10/2021   Abnormal CXR 03/10/2021   Ear pain, bilateral 03/10/2021   Morbid obesity 03/10/2021   Subungual  hematoma of second toe of left foot 10/28/2020   Insomnia 10/28/2020   Memory loss or impairment 09/26/2020   Forgetfulness 09/26/2020   Recurrent vertigo 09/26/2020   Abnormal MRI, cervical spine 03/04/2020   Right leg weakness 02/28/2020   Knee pain, right 10/23/2019   Cough 08/09/2019   Carpal tunnel syndrome 06/13/2019   Chronic pain syndrome 06/13/2019   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 06/13/2019   Neck pain 03/23/2019   Near syncope 12/05/2018   ESRD on hemodialysis 11/22/2018   Ischemic heart disease 09/25/2018   Not currently working due to disabled status 06/13/2018   ESRD on dialysis 06/12/2018   HTN (hypertension) 06/12/2018   Depression 03/09/2018   Adrenal mass, left 11/09/2017   MGUS (monoclonal gammopathy of unknown significance) 11/05/2017   Shoulder pain 11/01/2017   Chronic diastolic (congestive) heart failure 09/20/2017   Bilateral leg weakness 09/09/2017   Adrenal adenoma 09/03/2017   Uncontrolled type 2 diabetes mellitus 08/18/2017   Unsteady gait 07/11/2017   Recurrent falls 07/11/2017   Anemia of chronic disease 03/24/2017   Personal history of colonic polyps 02/10/2017   Dysphagia 11/05/2016   Irritable bowel syndrome 02/04/2016   Hospital discharge follow-up 10/27/2015   Intolerant of heat 10/07/2014   Cardiac murmur 01/17/2014   Back pain with left-sided radiculopathy 09/18/2013   Seasonal allergies 03/10/2013   Lipoma of back 12/22/2012   Other seasonal allergic rhinitis 08/22/2012   OSA (obstructive sleep apnea) 06/02/2011   Chronic kidney disease, stage 4 (severe) 01/13/2011   Neuropathy 04/16/2010   Malaise and fatigue 07/24/2009   Personal history of other diseases of the nervous system and sense organs 05/08/2009   LUPUS ERYTHEMATOSUS, DISCOID 06/05/2008   Arm pain 05/30/2008   Hyperlipidemia 06/07/2007   Obesity 06/07/2007   Malignant hypertension 06/07/2007   Type 2 diabetes mellitus with hyperglycemia  06/07/2007   Diabetes mellitus 06/07/2007   Past Medical History:  Diagnosis Date   Acid reflux    Anemia    Arthritis    Axillary masses    Soft tissue - status post excision   Back pain    COVID-19 virus infection 04/06/2019   Depression    End-stage renal disease    M/W/F dialysis   Essential hypertension    Headache    years ago   History of blood transfusion    History of cardiac catheterization    Normal coronary arteries October 2020   History of claustrophobia    History of pneumonia 2019   Hypoxia 04/03/2019  Mixed hyperlipidemia    Obesity    Pancreatitis    Peritoneal dialysis catheter in place    Pneumonia due to COVID-19 virus 04/02/2019   Sleep apnea    Noncompliant with CPAP   Stroke    mini stroke   Type 2 diabetes mellitus     Family History  Problem Relation Age of Onset   Hypertension Father    Hypercholesterolemia Father    Arthritis Father    Hypertension Sister    Hypercholesterolemia Sister    Breast cancer Sister    Hypertension Sister    Colon cancer Neg Hx    Colon polyps Neg Hx     Past Surgical History:  Procedure Laterality Date   ABDOMINAL AORTOGRAM W/LOWER EXTREMITY N/A 04/30/2022   Procedure: ABDOMINAL AORTOGRAM W/LOWER EXTREMITY;  Surgeon: Leonie Douglas, MD;  Location: [facility] INVASIVE CV LAB;  Service: Cardiovascular;  Laterality: N/A;   ABDOMINAL HYSTERECTOMY     AMPUTATION Right 05/29/2022   Procedure: RIGHT BELOW THE KNEE AMPUTATION;  Surgeon: [name], MD;  Location: [facility] OR;  Service: Orthopedics;  Laterality: Right;   AV FISTULA PLACEMENT Left 09/02/2017   Procedure: creation of left arm ARTERIOVENOUS (AV) FISTULA;  Surgeon: Nada Libman, MD;  Location: [facility] OR;  Service: Vascular;  Laterality: Left;   COLONOSCOPY  2008   Dr. Darrick Penna: normal    COLONOSCOPY N/A 12/18/2016   Dr. Darrick Penna: multiple tubular adenomas, internal hemorrhoids. Surveillance in 3 years    ESOPHAGEAL DILATION N/A 10/13/2015   Procedure: ESOPHAGEAL  DILATION;  Surgeon: Malissa Hippo, MD;  Location: [facility];  Service: Endoscopy;  Laterality: N/A;   ESOPHAGOGASTRODUODENOSCOPY N/A 10/13/2015   Dr. Karilyn Cota: chronic gastritis on path, no H.pylori. Empiric dilation    ESOPHAGOGASTRODUODENOSCOPY N/A 12/18/2016   Dr. Darrick Penna: mild gastritis. BRAVO study revealed uncontrolled GERD. Dysphagia secondary to uncontrolled reflux   FOOT SURGERY Bilateral    "nerve"     LEFT HEART CATH AND CORONARY ANGIOGRAPHY N/A 12/29/2018   Procedure: LEFT HEART CATH AND CORONARY ANGIOGRAPHY;  Surgeon: Corky Crafts, MD;  Location: [facility] INVASIVE CV LAB;  Service: Cardiovascular;  Laterality: N/A;   LOWER EXTREMITY ANGIOGRAPHY Right 05/04/2022   Procedure: Lower Extremity Angiography;  Surgeon: Victorino Sparrow, MD;  Location: [facility] INVASIVE CV LAB;  Service: Cardiovascular;  Laterality: Right;   LUNG BIOPSY     MASS EXCISION Right 01/09/2013   Procedure: EXCISION OF NEOPLASM OF RIGHT  AXILLA  AND EXCISION OF NEOPLASM OF LEFT AXILLA;  Surgeon: Dalia Heading, MD;  Location: [facility] ORS;  Service: General;  Laterality: Right;  procedure end @ 08:23   MYRINGOTOMY WITH TUBE PLACEMENT Bilateral 04/28/2017   Procedure: BILATERAL MYRINGOTOMY WITH TUBE PLACEMENT;  Surgeon: Newman Pies, MD;  Location: [facility] OR;  Service: ENT;  Laterality: Bilateral;   PERIPHERAL VASCULAR BALLOON ANGIOPLASTY Right 05/04/2022   Procedure: PERIPHERAL VASCULAR BALLOON ANGIOPLASTY;  Surgeon: Victorino Sparrow, MD;  Location: [facility] INVASIVE CV LAB;  Service: Cardiovascular;  Laterality: Right;  PT   PERIPHERAL VASCULAR INTERVENTION Right 05/04/2022   Procedure: PERIPHERAL VASCULAR INTERVENTION;  Surgeon: Victorino Sparrow, MD;  Location: [facility] INVASIVE CV LAB;  Service: Cardiovascular;  Laterality: Right;  SFA   REVISION OF ARTERIOVENOUS GORETEX GRAFT Left 05/04/2018   Procedure: TRANSPOSITION OF CEPHALIC VEIN ARTERIOVENOUS FISTULA LEFT ARM;  Surgeon: Larina Earthly, MD;  Location: [facility] OR;  Service: Vascular;  Laterality:  Left;   SAVORY DILATION N/A 12/18/2016   Procedure: SAVORY DILATION;  Surgeon: West Bali, MD;  Location: [facility];  Service: Endoscopy;  Laterality: N/A;   Social History   Occupational History   Occupation: retired   Tobacco Use   Smoking status: Never    Passive exposure: Never   Smokeless tobacco: Never   Tobacco comments:    Verified by Daughter, [name]  Vaping Use   Vaping Use: Never used  Substance and Sexual Activity   Alcohol use: No   Drug use: No   Sexual activity: Not Currently    Partners: Male

## 2022-07-13 ENCOUNTER — Encounter: Payer: Self-pay | Admitting: Family Medicine

## 2022-07-13 ENCOUNTER — Other Ambulatory Visit: Payer: Self-pay | Admitting: Family Medicine

## 2022-07-13 DIAGNOSIS — L97521 Non-pressure chronic ulcer of other part of left foot limited to breakdown of skin: Secondary | ICD-10-CM | POA: Insufficient documentation

## 2022-07-13 DIAGNOSIS — N2581 Secondary hyperparathyroidism of renal origin: Secondary | ICD-10-CM | POA: Diagnosis not present

## 2022-07-13 DIAGNOSIS — N186 End stage renal disease: Secondary | ICD-10-CM | POA: Diagnosis not present

## 2022-07-13 DIAGNOSIS — Z992 Dependence on renal dialysis: Secondary | ICD-10-CM | POA: Diagnosis not present

## 2022-07-13 NOTE — Assessment & Plan Note (Signed)
Daughter reports improved blood sugar and actually has been using much lower doses of medication than prescribed to control blood sugar Kathryn Beck is reminded of the importance of commitment to daily physical activity for 30 minutes or more, as able and the need to limit carbohydrate intake to 30 to 60 grams per meal to help with blood sugar control.   The need to take medication as prescribed, test blood sugar as directed, and to call between visits if there is a concern that blood sugar is uncontrolled is also discussed.   Kathryn Beck is reminded of the importance of daily foot exam, annual eye examination, and good blood sugar, blood pressure and cholesterol control.     Latest Ref Rng & Units 07/07/2022   11:40 AM 06/10/2022    8:30 AM 06/08/2022    4:36 AM 06/07/2022    3:21 AM 06/05/2022    4:26 AM  Diabetic Labs  HbA1c 4.8 - 5.6 % 6.3       Creatinine 0.57 - 1.00 mg/dL 4.09  8.11  9.14  7.82  7.73       07/07/2022   10:05 AM 06/10/2022    7:44 PM 06/10/2022   12:50 PM 06/10/2022   12:24 PM 06/10/2022   12:04 PM 06/10/2022   11:30 AM 06/10/2022   11:00 AM  BP/Weight  Systolic BP 150 127 137 121 96 101 102  Diastolic BP 82 49 53 47 44 50 49  Wt. (Lbs)    175.71     BMI    30.16 kg/m2         Latest Ref Rng & Units 04/16/2022   11:20 AM 07/24/2021    3:42 PM  Foot/eye exam completion dates  Eye Exam No Retinopathy  No Retinopathy      Foot Form Completion  Done      This result is from an external source.       Managed by enod

## 2022-07-13 NOTE — Assessment & Plan Note (Signed)
DASH diet and commitment to daily physical activity for a minimum of 30 minutes discussed and encouraged, as a part of hypertension management. The importance of attaining a healthy weight is also discussed.     07/07/2022   10:05 AM 06/10/2022    7:44 PM 06/10/2022   12:50 PM 06/10/2022   12:24 PM 06/10/2022   12:04 PM 06/10/2022   11:30 AM 06/10/2022   11:00 AM  BP/Weight  Systolic BP 150 127 137 121 96 101 102  Diastolic BP 82 49 53 47 44 50 49  Wt. (Lbs)    175.71     BMI    30.16 kg/m2        No med change, pt has /o " bottoming out " at diaysis, continue same medication

## 2022-07-13 NOTE — Progress Notes (Signed)
Kathryn Beck     MRN: [id number]      DOB: 1955-12-15   HPI [name] is here for follow up and re-evaluation of chronic medical conditions, She was d/c from [facility] on 06/29/2022,having been d/c on 06/10/2022 following hospitalization from 05/29/2022 admitted with gangrene right foot, had BKA, Right on 05/29/2022 Daughter who accompanies her today states she is recovering well and has significant independence in activities at home C/o toe on left foot is exposed after toenail fell off , area is open and raw. No fever or chills , no obvious purulent drainage They do have All durable equipment needed Denies polyuria, polydipsia, blurred vision , or hypoglycemic episodes.    ROS Denies recent fever or chills. Denies sinus pressure, nasal congestion, ear pain or sore throat. Denies chest congestion, productive cough or wheezing. Denies chest pains, palpitations and leg swelling Denies abdominal pain, nausea, vomiting,diarrhea or constipation.   Denies dysuria, frequency, hesitancy or incontinence.  Denies headaches, seizures, numbness, or tingling. Denies depression, anxiety or insomnia.  PE  BP (!) 150/82   Pulse 80   Resp 16   SpO2 96%   Patient alert and oriented and in no cardiopulmonary distress.Sitting in wheelchair, Right BKA, wound not exposed , dressing not removed  HEENT: No facial asymmetry, EOMI,     Neck supple .  Chest: Clear to auscultation bilaterally.  CVS: S1, S2 no murmurs, no S3.Regular rate.  ABD: Soft non tender.     MS: decreased  ROM spine, shoulders, hips and knees.  Skin: .Skin breakdown present on left 2nd toe where nail has fallen off , mild erythema and swelling of the toe with clear drainage noted  Psych: Good eye contact, normal affect. Memory intact not anxious or depressed appearing.  CNS: CN 2-12 intact, .no focal deficits noted.   Assessment & Plan  Toe ulcer, left, limited to breakdown of skin Ulcer left 2nd toe ,  nailbed exposed where nail has fallen off. Mild erythema and swelling of toe. Septra x 10 days and Podiatry to eval and manage asap  Type 2 diabetes mellitus with hyperglycemia (HCC) Daughter reports improved blood sugar and actually has been using much lower doses of medication than prescribed to control blood sugar Kathryn Beck is reminded of the importance of commitment to daily physical activity for 30 minutes or more, as able and the need to limit carbohydrate intake to 30 to 60 grams per meal to help with blood sugar control.   The need to take medication as prescribed, test blood sugar as directed, and to call between visits if there is a concern that blood sugar is uncontrolled is also discussed.   Kathryn Beck is reminded of the importance of daily foot exam, annual eye examination, and good blood sugar, blood pressure and cholesterol control.     Latest Ref Rng & Units 07/07/2022   11:40 AM 06/10/2022    8:30 AM 06/08/2022    4:36 AM 06/07/2022    3:21 AM 06/05/2022    4:26 AM  Diabetic Labs  HbA1c 4.8 - 5.6 % 6.3       Creatinine 0.57 - 1.00 mg/dL 7.82  9.56  2.13  0.86  7.73       07/07/2022   10:05 AM 06/10/2022    7:44 PM 06/10/2022   12:50 PM 06/10/2022   12:24 PM 06/10/2022   12:04 PM 06/10/2022   11:30 AM 06/10/2022   11:00 AM  BP/Weight  Systolic BP 150  127 137 121 96 101 102  Diastolic BP 82 49 53 47 44 50 49  Wt. (Lbs)    175.71     BMI    30.16 kg/m2         Latest Ref Rng & Units 04/16/2022   11:20 AM 07/24/2021    3:42 PM  Foot/eye exam completion dates  Eye Exam No Retinopathy  No Retinopathy      Foot Form Completion  Done      This result is from an external source.       Managed by enod  Malignant hypertension DASH diet and commitment to daily physical activity for a minimum of 30 minutes discussed and encouraged, as a part of hypertension management. The importance of attaining a healthy weight is also discussed.     07/07/2022   10:05 AM 06/10/2022     7:44 PM 06/10/2022   12:50 PM 06/10/2022   12:24 PM 06/10/2022   12:04 PM 06/10/2022   11:30 AM 06/10/2022   11:00 AM  BP/Weight  Systolic BP 150 127 137 121 96 101 102  Diastolic BP 82 49 53 47 44 50 49  Wt. (Lbs)    175.71     BMI    30.16 kg/m2        No med change, pt has /o " bottoming out " at diaysis, continue same medication  Hospital discharge follow-up Hospitalized followed by admission to Rehab facility between 3/08 to 06/29/2022 for right BKA, recovery is good overll, however new concern or ulcer to left 2nd toe where pt has lost a toenail. Antibiotic prescribed, cleaned with saline and dry gauze loosely applied, referral to Podiatry to  follow  Hyperlipidemia Hyperlipidemia:Low fat diet discussed and encouraged.   Lipid Panel  Lab Results  Component Value Date   CHOL 116 05/01/2022   HDL 26 (L) 05/01/2022   LDLCALC 31 05/01/2022   LDLDIRECT 120.0 11/21/2020   TRIG 294 (H) 05/01/2022   CHOLHDL 4.5 05/01/2022     Mrkedly elevated TG, needs to reduce fat in diet

## 2022-07-13 NOTE — Assessment & Plan Note (Signed)
Hospitalized followed by admission to Rehab facility between 3/08 to 06/29/2022 for right BKA, recovery is good overll, however new concern or ulcer to left 2nd toe where pt has lost a toenail. Antibiotic prescribed, cleaned with saline and dry gauze loosely applied, referral to Podiatry to  follow

## 2022-07-13 NOTE — Assessment & Plan Note (Addendum)
Ulcer left 2nd toe , nailbed exposed where nail has fallen off. Mild erythema and swelling of toe. Septra x 10 days and Podiatry to eval and manage asap

## 2022-07-13 NOTE — Assessment & Plan Note (Signed)
Hyperlipidemia:Low fat diet discussed and encouraged.   Lipid Panel  Lab Results  Component Value Date   CHOL 116 05/01/2022   HDL 26 (L) 05/01/2022   LDLCALC 31 05/01/2022   LDLDIRECT 120.0 11/21/2020   TRIG 294 (H) 05/01/2022   CHOLHDL 4.5 05/01/2022     Mrkedly elevated TG, needs to reduce fat in diet

## 2022-07-14 ENCOUNTER — Encounter: Payer: Self-pay | Admitting: *Deleted

## 2022-07-14 ENCOUNTER — Telehealth: Payer: Self-pay

## 2022-07-14 ENCOUNTER — Ambulatory Visit: Payer: Self-pay | Admitting: *Deleted

## 2022-07-14 NOTE — Telephone Encounter (Signed)
Kathryn Beck with [name] called stated that patient has retained staple medially on incision. I provided verbal order for nurse to remove when they see patient one day this week.

## 2022-07-14 NOTE — Patient Instructions (Signed)
Visit Information  Thank you for taking time to visit with me today. Please don't hesitate to contact me if I can be of assistance to you.   Following are the goals we discussed today:   Goals Addressed             This Visit's Progress    Receive Assistance Applying for Aid & Attendance Benefits, through [employer].   On track    Care Coordination Interventions:  Interventions Today    Flowsheet Row Most Recent Value  Chronic Disease   Chronic disease during today's visit Diabetes, Hypertension (HTN), Chronic Kidney Disease/End Stage Renal Disease (ESRD), Congestive Heart Failure (CHF), Other  [Neuropathy, Chronic Pain Syndrome, Anxiety, Depression, Memory Loss, Neurocognitive Disorder & Requires Maximum Assistance with Activities of Daily Living]  General Interventions   General Interventions Discussed/Reviewed General Interventions Reviewed, Annual Eye Exam, General Interventions Discussed, Labs, Annual Foot Exam, Lipid Profile, Durable Medical Equipment (DME), Vaccines, Health Screening, [employer], Doctor Visits, Communication with, Level of Care  [Primary Care Provider]  Labs Hgb A1c every 3 months, Kidney Function  Vaccines COVID-19, Flu, Pneumonia, RSV, Shingles, Tetanus/Pertussis/Diphtheria  [Encouraged]  Doctor Visits Discussed/Reviewed Doctor Visits Discussed, Doctor Visits Reviewed, Annual Wellness Visits, PCP, Specialist  [Encouraged]  Health Screening Colonoscopy, Mammogram  [Encouraged]  Durable Medical Equipment (DME) BP Cuff, Glucomoter, Environmental consultant, Wheelchair, Physicist, medical  PCP/Specialist Visits Compliance with follow-up visit  Communication with PCP/Specialists, RN  Level of Care Adult Daycare, Air traffic controller, Assisted Living, Skilled Nursing Facility, Teaching laboratory technician Medicaid, Personal Care Services  Exercise Interventions   Exercise Discussed/Reviewed Exercise Discussed, Exercise Reviewed, Physical  Activity, Assistive device use and maintanence  Physical Activity Discussed/Reviewed Physical Activity Discussed, Physical Activity Reviewed, Types of exercise, Home Exercise Program (HEP)  [Encouraged]  Education Interventions   Education Provided Provided Therapist, sports, Provided Web-based Education, Provided Education  Provided Verbal Education On Nutrition, Foot Care, Eye Care, Labs, Blood Sugar Monitoring, Mental Health/Coping with Illness, Applications, Exercise, Medication, When to see the doctor, [employer], Human resources officer, Personal Care Services  Mental Health Interventions   Mental Health Discussed/Reviewed Mental Health Discussed, Mental Health Reviewed, Coping Strategies, Crisis, Anxiety, Depression, Grief and Loss, Substance Abuse, Suicide  Nutrition Interventions   Nutrition Discussed/Reviewed Nutrition Discussed, Nutrition Reviewed, Adding fruits and vegetables, Fluid intake, Decreasing sugar intake, Increaing proteins, Decreasing fats, Decreasing salt  Pharmacy Interventions   Pharmacy Dicussed/Reviewed Pharmacy Topics Discussed, Pharmacy Topics Reviewed, Medication Adherence, Affording Medications, Referral to Pharmacist  Medication Adherence Unable to refill medication  Referral to Pharmacist Cannot afford medications  Safety Interventions   Safety Discussed/Reviewed Safety Discussed, Safety Reviewed, Fall Risk, Home Safety  Home Safety Assistive Devices, Need for home safety assessment, Refer for home visit, Contact provider for referral to PT/OT, Refer for community resources, Contact home health agency  Advanced Directive Interventions   Advanced Directives Discussed/Reviewed Advanced Directives Discussed, Advanced Directives Reviewed, Advanced Care Planning, Provided resource for acquiring and filling out documents     Active Listening & Reflection Utilized.  Emotional Support Provided. Verbalization of Feelings Encouraged.  Feelings  of Sadness Regarding Loss of Independence Acknowledged. Symptoms of Caregiver Burnout & Stress Validated. Self-Enrollment in Caregiver Support Group of Interest Emphasized. Solution-Focused Strategies Employed. Problem Solving Interventions Activated. Task-Centered Solutions Promoted.  Cognitive Behavioral Therapy Performed.  Client-Centered Therapy Indicated. Acceptance & Commitment Therapy Initiated. CSW Collaboration with Patient & Daughter, [name] to Confirm Receipt of [name] [phone]) &  Approval of 28 Precooked Frozen Meals Delivered Directly to Your Home.   CSW Collaboration with Patient & Daughter, [name] to [employer] [phone]), a Free Benefit to [facility] Recipients. CSW Collaboration with Patient & Daughter, [name] to Encourage Attendance at [facility], Scheduled on 08/12/2022 at 4:00 PM, at [facility] of Conni Elliot [phone]), to [employer] with Completion of Simple Janalyn Harder Powers of [address], Healthcare Powers of Attorney & Living [place]. CSW Collaboration with Patient & Daughter, [name] to Control and instrumentation engineer with [facility] [phone]), to Schedule Appointment to Receive Assistance with Completion of Application for Aid & Attendance Benefits. CSW Collaboration with Patient & Daughter, [name] to [employer] with CSW [phone]), if She Has Questions, Needs Assistance, or If Additional Social Work Needs Are Identified Between Now & Our Next Scheduled Follow-Up Outreach Call.      Our next appointment is by telephone on 07/28/2022 at 9:30 am.  Please call the care guide team at [phone] if you need to cancel or reschedule your appointment.   If you are experiencing a Mental Health or Behavioral Health Crisis or need someone to talk to,  please call the Suicide and Crisis Lifeline: 988 call the [place] National Suicide Prevention Lifeline: 510-092-7503 or TTY: (716)842-1911 TTY 5127704197) to talk to a trained counselor call 1-800-273-TALK (toll free, 24 hour hotline) go to [facility] [address] [place] [phone]) call the [facility] Crisis Line: (918) 627-2089 call 911  Patient verbalizes understanding of instructions and care plan provided today and agrees to view in MyChart. Active MyChart status and patient understanding of how to access instructions and care plan via MyChart confirmed with patient.     Telephone follow up appointment with care management team member scheduled for:  07/28/2022 at 9:30 am.  Danford Bad, BSW, MSW, LCSW  Licensed Clinical Social Worker  [facility]  Mailing [place]. [address] [place], [place] [address] Physical Address-[address] [place], [place] [address] Toll Free Main # [phone] Fax # [phone] Cell # [phone] [email or web address]

## 2022-07-14 NOTE — Patient Outreach (Signed)
Care Coordination   Follow Up Visit Note   07/14/2022  Name: Kathryn Beck MRN: [id number] DOB: 1955/09/22  Kathryn Beck is a 67 y.o. year old female who sees [name], MD for primary care. I spoke with Kathryn Beck and daughter, Kathryn Beck by phone today.  What matters to the patients health and wellness today?  Receive Assistance Applying for Aid & Attendance Benefits, through [employer].   Goals Addressed             This Visit's Progress    Receive Assistance Applying for Aid & Attendance Benefits, through [employer].   On track    Care Coordination Interventions:  Interventions Today    Flowsheet Row Most Recent Value  Chronic Disease   Chronic disease during today's visit Diabetes, Hypertension (HTN), Chronic Kidney Disease/End Stage Renal Disease (ESRD), Congestive Heart Failure (CHF), Other  [Neuropathy, Chronic Pain Syndrome, Anxiety, Depression, Memory Loss, Neurocognitive Disorder & Requires Maximum Assistance with Activities of Daily Living]  General Interventions   General Interventions Discussed/Reviewed General Interventions Reviewed, Annual Eye Exam, General Interventions Discussed, Labs, Annual Foot Exam, Lipid Profile, Durable Medical Equipment (DME), Vaccines, Health Screening, [employer], Doctor Visits, Communication with, Level of Care  [Primary Care Provider]  Labs Hgb A1c every 3 months, Kidney Function  Vaccines COVID-19, Flu, Pneumonia, RSV, Shingles, Tetanus/Pertussis/Diphtheria  [Encouraged]  Doctor Visits Discussed/Reviewed Doctor Visits Discussed, Doctor Visits Reviewed, Annual Wellness Visits, PCP, Specialist  [Encouraged]  Health Screening Colonoscopy, Mammogram  [Encouraged]  Durable Medical Equipment (DME) BP Cuff, Glucomoter, Environmental consultant, Wheelchair, Physicist, medical  PCP/Specialist Visits Compliance with follow-up visit  Communication with PCP/Specialists, RN  Level of Care  Adult Daycare, Air traffic controller, Assisted Living, Skilled Nursing Facility, Teaching laboratory technician Medicaid, Personal Care Services  Exercise Interventions   Exercise Discussed/Reviewed Exercise Discussed, Exercise Reviewed, Physical Activity, Assistive device use and maintanence  Physical Activity Discussed/Reviewed Physical Activity Discussed, Physical Activity Reviewed, Types of exercise, Home Exercise Program (HEP)  [Encouraged]  Education Interventions   Education Provided Provided Therapist, sports, Provided Web-based Education, Provided Education  Provided Verbal Education On Nutrition, Foot Care, Eye Care, Labs, Blood Sugar Monitoring, Mental Health/Coping with Illness, Applications, Exercise, Medication, When to see the doctor, [employer], Human resources officer, Personal Care Services  Mental Health Interventions   Mental Health Discussed/Reviewed Mental Health Discussed, Mental Health Reviewed, Coping Strategies, Crisis, Anxiety, Depression, Grief and Loss, Substance Abuse, Suicide  Nutrition Interventions   Nutrition Discussed/Reviewed Nutrition Discussed, Nutrition Reviewed, Adding fruits and vegetables, Fluid intake, Decreasing sugar intake, Increaing proteins, Decreasing fats, Decreasing salt  Pharmacy Interventions   Pharmacy Dicussed/Reviewed Pharmacy Topics Discussed, Pharmacy Topics Reviewed, Medication Adherence, Affording Medications, Referral to Pharmacist  Medication Adherence Unable to refill medication  Referral to Pharmacist Cannot afford medications  Safety Interventions   Safety Discussed/Reviewed Safety Discussed, Safety Reviewed, Fall Risk, Home Safety  Home Safety Assistive Devices, Need for home safety assessment, Refer for home visit, Contact provider for referral to PT/OT, Refer for community resources, Contact home health agency  Advanced Directive Interventions   Advanced Directives Discussed/Reviewed Advanced Directives  Discussed, Advanced Directives Reviewed, Advanced Care Planning, Provided resource for acquiring and filling out documents     Active Listening & Reflection Utilized.  Emotional Support Provided. Verbalization of Feelings Encouraged.  Feelings of Sadness Regarding Loss of Independence Acknowledged. Symptoms of Caregiver Burnout & Stress Validated. Self-Enrollment in Caregiver Support Group of Interest Emphasized. Solution-Focused Strategies Employed. Problem Solving Interventions  Activated. Task-Centered Solutions Promoted.  Cognitive Behavioral Therapy Performed.  Client-Centered Therapy Indicated. Acceptance & Commitment Therapy Initiated. CSW Collaboration with Patient & Daughter, Kathryn Beck to Confirm Receipt of [name] [phone]) & Approval of 28 Precooked Frozen Meals Delivered Directly to Your Home.   CSW Collaboration with Patient & Daughter, Kathryn Beck to [employer] of [employer] [phone]), a Free Benefit to [facility] Recipients. CSW Collaboration with Patient & Daughter, Kathryn Beck to Encourage Attendance at [facility], Scheduled on 08/12/2022 at 4:00 PM, at [facility] of [place]ni Elliot [phone]), to [employer] with Completion of Simple Janalyn Harder Powers of [address], Healthcare Powers of Attorney & Living [place]. CSW Collaboration with Patient & Daughter, Kathryn Beck to Control and instrumentation engineer with [facility] [phone]), to Schedule Appointment to Receive Assistance with Completion of Application for Aid & Attendance Benefits. CSW Collaboration with Patient & Daughter, Kathryn Beck to [employer] with CSW [phone]), if She Has Questions, Needs Assistance, or If Additional Social Work Needs Are Identified Between Now & Our Next Scheduled Follow-Up Outreach Call.      SDOH assessments and  interventions completed:  Yes.  Care Coordination Interventions:  Yes, provided.   Follow up plan: Follow up call scheduled for 07/28/2022 at 9:30 am.  Encounter Outcome:  Pt. Visit Completed.   Danford Bad, BSW, MSW, LCSW  Licensed Restaurant manager, [facility]  Mailing [place] [address] [place], [place] [address] Physical Address-[address], [place], [place] [address] Toll Free Main # [phone] Fax # [phone] Cell # [phone] [email or web address]

## 2022-07-14 NOTE — Telephone Encounter (Signed)
Noted! Thank you

## 2022-07-15 DIAGNOSIS — N2581 Secondary hyperparathyroidism of renal origin: Secondary | ICD-10-CM | POA: Diagnosis not present

## 2022-07-15 DIAGNOSIS — Z992 Dependence on renal dialysis: Secondary | ICD-10-CM | POA: Diagnosis not present

## 2022-07-15 DIAGNOSIS — N186 End stage renal disease: Secondary | ICD-10-CM | POA: Diagnosis not present

## 2022-07-17 DIAGNOSIS — N2581 Secondary hyperparathyroidism of renal origin: Secondary | ICD-10-CM | POA: Diagnosis not present

## 2022-07-17 DIAGNOSIS — Z992 Dependence on renal dialysis: Secondary | ICD-10-CM | POA: Diagnosis not present

## 2022-07-17 DIAGNOSIS — N186 End stage renal disease: Secondary | ICD-10-CM | POA: Diagnosis not present

## 2022-07-18 ENCOUNTER — Inpatient Hospital Stay (HOSPITAL_COMMUNITY)
Admission: EM | Admit: 2022-07-18 | Discharge: 2022-07-22 | DRG: 278 | Disposition: A | Discharge status: Home Health | Payer: Medicare PPO | Attending: Internal Medicine | Admitting: Internal Medicine

## 2022-07-18 ENCOUNTER — Encounter (HOSPITAL_COMMUNITY): Payer: Self-pay | Admitting: Emergency Medicine

## 2022-07-18 ENCOUNTER — Emergency Department (HOSPITAL_COMMUNITY): Payer: Medicare PPO

## 2022-07-18 ENCOUNTER — Other Ambulatory Visit: Payer: Self-pay

## 2022-07-18 DIAGNOSIS — I70245 Atherosclerosis of native arteries of left leg with ulceration of other part of foot: Secondary | ICD-10-CM | POA: Diagnosis not present

## 2022-07-18 DIAGNOSIS — R600 Localized edema: Secondary | ICD-10-CM | POA: Diagnosis not present

## 2022-07-18 DIAGNOSIS — E1165 Type 2 diabetes mellitus with hyperglycemia: Secondary | ICD-10-CM | POA: Diagnosis present

## 2022-07-18 DIAGNOSIS — Z8673 Personal history of transient ischemic attack (TIA), and cerebral infarction without residual deficits: Secondary | ICD-10-CM | POA: Diagnosis not present

## 2022-07-18 DIAGNOSIS — R0989 Other specified symptoms and signs involving the circulatory and respiratory systems: Secondary | ICD-10-CM | POA: Diagnosis present

## 2022-07-18 DIAGNOSIS — I70262 Atherosclerosis of native arteries of extremities with gangrene, left leg: Secondary | ICD-10-CM | POA: Diagnosis not present

## 2022-07-18 DIAGNOSIS — Z6835 Body mass index (BMI) 35.0-35.9, adult: Secondary | ICD-10-CM | POA: Diagnosis not present

## 2022-07-18 DIAGNOSIS — F32A Depression, unspecified: Secondary | ICD-10-CM | POA: Diagnosis present

## 2022-07-18 DIAGNOSIS — E669 Obesity, unspecified: Secondary | ICD-10-CM | POA: Diagnosis present

## 2022-07-18 DIAGNOSIS — E1152 Type 2 diabetes mellitus with diabetic peripheral angiopathy with gangrene: Principal | ICD-10-CM | POA: Diagnosis present

## 2022-07-18 DIAGNOSIS — Z6834 Body mass index (BMI) 34.0-34.9, adult: Secondary | ICD-10-CM | POA: Diagnosis not present

## 2022-07-18 DIAGNOSIS — F40240 Claustrophobia: Secondary | ICD-10-CM | POA: Diagnosis present

## 2022-07-18 DIAGNOSIS — E6609 Other obesity due to excess calories: Secondary | ICD-10-CM | POA: Diagnosis not present

## 2022-07-18 DIAGNOSIS — S90935D Unspecified superficial injury of left lesser toe(s), subsequent encounter: Secondary | ICD-10-CM | POA: Diagnosis not present

## 2022-07-18 DIAGNOSIS — E8889 Other specified metabolic disorders: Secondary | ICD-10-CM | POA: Diagnosis present

## 2022-07-18 DIAGNOSIS — E1122 Type 2 diabetes mellitus with diabetic chronic kidney disease: Secondary | ICD-10-CM | POA: Diagnosis not present

## 2022-07-18 DIAGNOSIS — Z7982 Long term (current) use of aspirin: Secondary | ICD-10-CM

## 2022-07-18 DIAGNOSIS — I5032 Chronic diastolic (congestive) heart failure: Secondary | ICD-10-CM | POA: Diagnosis not present

## 2022-07-18 DIAGNOSIS — L97509 Non-pressure chronic ulcer of other part of unspecified foot with unspecified severity: Secondary | ICD-10-CM | POA: Diagnosis not present

## 2022-07-18 DIAGNOSIS — Z5986 Financial insecurity: Secondary | ICD-10-CM

## 2022-07-18 DIAGNOSIS — E11621 Type 2 diabetes mellitus with foot ulcer: Secondary | ICD-10-CM | POA: Diagnosis present

## 2022-07-18 DIAGNOSIS — I739 Peripheral vascular disease, unspecified: Secondary | ICD-10-CM | POA: Diagnosis not present

## 2022-07-18 DIAGNOSIS — Z888 Allergy status to other drugs, medicaments and biological substances status: Secondary | ICD-10-CM

## 2022-07-18 DIAGNOSIS — I70221 Atherosclerosis of native arteries of extremities with rest pain, right leg: Secondary | ICD-10-CM | POA: Diagnosis not present

## 2022-07-18 DIAGNOSIS — D631 Anemia in chronic kidney disease: Secondary | ICD-10-CM | POA: Diagnosis present

## 2022-07-18 DIAGNOSIS — E782 Mixed hyperlipidemia: Secondary | ICD-10-CM

## 2022-07-18 DIAGNOSIS — Z90710 Acquired absence of both cervix and uterus: Secondary | ICD-10-CM

## 2022-07-18 DIAGNOSIS — E1142 Type 2 diabetes mellitus with diabetic polyneuropathy: Secondary | ICD-10-CM | POA: Diagnosis present

## 2022-07-18 DIAGNOSIS — E11649 Type 2 diabetes mellitus with hypoglycemia without coma: Secondary | ICD-10-CM | POA: Diagnosis not present

## 2022-07-18 DIAGNOSIS — Z7984 Long term (current) use of oral hypoglycemic drugs: Secondary | ICD-10-CM

## 2022-07-18 DIAGNOSIS — E785 Hyperlipidemia, unspecified: Secondary | ICD-10-CM | POA: Diagnosis present

## 2022-07-18 DIAGNOSIS — N186 End stage renal disease: Secondary | ICD-10-CM | POA: Diagnosis not present

## 2022-07-18 DIAGNOSIS — Z83438 Family history of other disorder of lipoprotein metabolism and other lipidemia: Secondary | ICD-10-CM

## 2022-07-18 DIAGNOSIS — L97529 Non-pressure chronic ulcer of other part of left foot with unspecified severity: Secondary | ICD-10-CM | POA: Diagnosis present

## 2022-07-18 DIAGNOSIS — K219 Gastro-esophageal reflux disease without esophagitis: Secondary | ICD-10-CM | POA: Diagnosis present

## 2022-07-18 DIAGNOSIS — Z79899 Other long term (current) drug therapy: Secondary | ICD-10-CM

## 2022-07-18 DIAGNOSIS — E1151 Type 2 diabetes mellitus with diabetic peripheral angiopathy without gangrene: Secondary | ICD-10-CM | POA: Diagnosis not present

## 2022-07-18 DIAGNOSIS — Z992 Dependence on renal dialysis: Secondary | ICD-10-CM | POA: Diagnosis not present

## 2022-07-18 DIAGNOSIS — I120 Hypertensive chronic kidney disease with stage 5 chronic kidney disease or end stage renal disease: Secondary | ICD-10-CM | POA: Diagnosis not present

## 2022-07-18 DIAGNOSIS — R200 Anesthesia of skin: Secondary | ICD-10-CM | POA: Diagnosis present

## 2022-07-18 DIAGNOSIS — N2581 Secondary hyperparathyroidism of renal origin: Secondary | ICD-10-CM | POA: Diagnosis present

## 2022-07-18 DIAGNOSIS — M1990 Unspecified osteoarthritis, unspecified site: Secondary | ICD-10-CM | POA: Diagnosis present

## 2022-07-18 DIAGNOSIS — I999 Unspecified disorder of circulatory system: Secondary | ICD-10-CM | POA: Diagnosis not present

## 2022-07-18 DIAGNOSIS — I10 Essential (primary) hypertension: Secondary | ICD-10-CM | POA: Diagnosis not present

## 2022-07-18 DIAGNOSIS — Z8701 Personal history of pneumonia (recurrent): Secondary | ICD-10-CM

## 2022-07-18 DIAGNOSIS — G4733 Obstructive sleep apnea (adult) (pediatric): Secondary | ICD-10-CM | POA: Diagnosis present

## 2022-07-18 DIAGNOSIS — I70244 Atherosclerosis of native arteries of left leg with ulceration of heel and midfoot: Secondary | ICD-10-CM | POA: Diagnosis not present

## 2022-07-18 DIAGNOSIS — Z8261 Family history of arthritis: Secondary | ICD-10-CM

## 2022-07-18 DIAGNOSIS — N250 Renal osteodystrophy: Secondary | ICD-10-CM | POA: Diagnosis not present

## 2022-07-18 DIAGNOSIS — Z8616 Personal history of COVID-19: Secondary | ICD-10-CM

## 2022-07-18 DIAGNOSIS — Z89511 Acquired absence of right leg below knee: Secondary | ICD-10-CM

## 2022-07-18 DIAGNOSIS — Z4781 Encounter for orthopedic aftercare following surgical amputation: Secondary | ICD-10-CM | POA: Diagnosis not present

## 2022-07-18 DIAGNOSIS — F419 Anxiety disorder, unspecified: Secondary | ICD-10-CM | POA: Diagnosis present

## 2022-07-18 DIAGNOSIS — L97521 Non-pressure chronic ulcer of other part of left foot limited to breakdown of skin: Secondary | ICD-10-CM | POA: Diagnosis not present

## 2022-07-18 DIAGNOSIS — Z8249 Family history of ischemic heart disease and other diseases of the circulatory system: Secondary | ICD-10-CM | POA: Diagnosis not present

## 2022-07-18 DIAGNOSIS — Z794 Long term (current) use of insulin: Secondary | ICD-10-CM | POA: Diagnosis not present

## 2022-07-18 DIAGNOSIS — Z880 Allergy status to penicillin: Secondary | ICD-10-CM

## 2022-07-18 DIAGNOSIS — K2950 Unspecified chronic gastritis without bleeding: Secondary | ICD-10-CM | POA: Diagnosis present

## 2022-07-18 DIAGNOSIS — I132 Hypertensive heart and chronic kidney disease with heart failure and with stage 5 chronic kidney disease, or end stage renal disease: Secondary | ICD-10-CM | POA: Diagnosis not present

## 2022-07-18 DIAGNOSIS — Z91199 Patient's noncompliance with other medical treatment and regimen due to unspecified reason: Secondary | ICD-10-CM

## 2022-07-18 DIAGNOSIS — Z7902 Long term (current) use of antithrombotics/antiplatelets: Secondary | ICD-10-CM

## 2022-07-18 LAB — CBC WITH DIFFERENTIAL/PLATELET
Abs Immature Granulocytes: 0.05 10*3/uL (ref 0.00–0.07)
Basophils Absolute: 0.1 10*3/uL (ref 0.0–0.1)
Basophils Relative: 1 %
Eosinophils Absolute: 0.1 10*3/uL (ref 0.0–0.5)
Eosinophils Relative: 1 %
HCT: 30.4 % — ABNORMAL LOW (ref 36.0–46.0)
Hemoglobin: 9.2 g/dL — ABNORMAL LOW (ref 12.0–15.0)
Immature Granulocytes: 1 %
Lymphocytes Relative: 16 %
Lymphs Abs: 1.5 10*3/uL (ref 0.7–4.0)
MCH: 25 pg — ABNORMAL LOW (ref 26.0–34.0)
MCHC: 30.3 g/dL (ref 30.0–36.0)
MCV: 82.6 fL (ref 80.0–100.0)
Monocytes Absolute: 0.7 10*3/uL (ref 0.1–1.0)
Monocytes Relative: 7 %
Neutro Abs: 7.4 10*3/uL (ref 1.7–7.7)
Neutrophils Relative %: 74 %
Platelets: 333 10*3/uL (ref 150–400)
RBC: 3.68 MIL/uL — ABNORMAL LOW (ref 3.87–5.11)
RDW: 16.8 % — ABNORMAL HIGH (ref 11.5–15.5)
WBC: 9.9 10*3/uL (ref 4.0–10.5)
nRBC: 0.6 % — ABNORMAL HIGH (ref 0.0–0.2)

## 2022-07-18 LAB — BASIC METABOLIC PANEL
Anion gap: 10 (ref 5–15)
BUN: 14 mg/dL (ref 8–23)
CO2: 27 mmol/L (ref 22–32)
Calcium: 9.1 mg/dL (ref 8.9–10.3)
Chloride: 102 mmol/L (ref 98–111)
Creatinine, Ser: 4.61 mg/dL — ABNORMAL HIGH (ref 0.44–1.00)
GFR, Estimated: 10 mL/min — ABNORMAL LOW (ref >60)
Glucose, Bld: 179 mg/dL — ABNORMAL HIGH (ref 70–99)
Potassium: 4.3 mmol/L (ref 3.5–5.1)
Sodium: 139 mmol/L (ref 135–145)

## 2022-07-18 LAB — GLUCOSE, CAPILLARY: Glucose-Capillary: 274 mg/dL — ABNORMAL HIGH (ref 70–99)

## 2022-07-18 LAB — CBG MONITORING, ED: Glucose-Capillary: 213 mg/dL — ABNORMAL HIGH (ref 70–99)

## 2022-07-18 MED ORDER — INSULIN ASPART 100 UNIT/ML IJ SOLN
0.0000 [IU] | Freq: Three times a day (TID) | INTRAMUSCULAR | Status: DC
  Administered 2022-07-18: 2 [IU] via SUBCUTANEOUS
  Administered 2022-07-19: 1 [IU] via SUBCUTANEOUS
  Filled 2022-07-18: qty 1

## 2022-07-18 MED ORDER — SUCROFERRIC OXYHYDROXIDE 500 MG PO CHEW
500.0000 mg | CHEWABLE_TABLET | ORAL | Status: DC
  Administered 2022-07-18: 1000 mg via ORAL
  Administered 2022-07-19 – 2022-07-22 (×3): 500 mg via ORAL
  Filled 2022-07-18 (×15): qty 1

## 2022-07-18 MED ORDER — DULOXETINE HCL 60 MG PO CPEP
60.0000 mg | ORAL_CAPSULE | Freq: Every day | ORAL | Status: DC
  Administered 2022-07-18 – 2022-07-22 (×4): 60 mg via ORAL
  Filled 2022-07-18: qty 1
  Filled 2022-07-18 (×3): qty 2
  Filled 2022-07-18: qty 1

## 2022-07-18 MED ORDER — KETOTIFEN FUMARATE 0.035 % OP SOLN
1.0000 [drp] | Freq: Two times a day (BID) | OPHTHALMIC | Status: DC
  Administered 2022-07-18 – 2022-07-22 (×7): 1 [drp] via OPHTHALMIC
  Filled 2022-07-18 (×2): qty 5

## 2022-07-18 MED ORDER — SODIUM CHLORIDE 0.9 % IV SOLN: 250.0000 mL | INTRAVENOUS | Status: DC | PRN

## 2022-07-18 MED ORDER — KETOTIFEN FUMARATE 0.035 % OP SOLN
1.0000 [drp] | Freq: Two times a day (BID) | OPHTHALMIC | Status: DC
  Filled 2022-07-18 (×2): qty 5

## 2022-07-18 MED ORDER — CALCITRIOL 0.25 MCG PO CAPS
0.2500 ug | ORAL_CAPSULE | Freq: Two times a day (BID) | ORAL | Status: DC
  Administered 2022-07-18 – 2022-07-22 (×5): 0.25 ug via ORAL
  Filled 2022-07-18 (×7): qty 1

## 2022-07-18 MED ORDER — SODIUM CHLORIDE 0.9% FLUSH
3.0000 mL | Freq: Two times a day (BID) | INTRAVENOUS | Status: DC
  Administered 2022-07-18 – 2022-07-20 (×5): 3 mL via INTRAVENOUS

## 2022-07-18 MED ORDER — HEPARIN SODIUM (PORCINE) 5000 UNIT/ML IJ SOLN
5000.0000 [IU] | Freq: Three times a day (TID) | INTRAMUSCULAR | Status: DC
  Administered 2022-07-18 – 2022-07-22 (×10): 5000 [IU] via SUBCUTANEOUS
  Filled 2022-07-18 (×10): qty 1

## 2022-07-18 MED ORDER — SUCROFERRIC OXYHYDROXIDE 500 MG PO CHEW
1000.0000 mg | CHEWABLE_TABLET | Freq: Three times a day (TID) | ORAL | Status: DC
  Administered 2022-07-19 – 2022-07-20 (×2): 1000 mg via ORAL
  Filled 2022-07-18 (×15): qty 2

## 2022-07-18 MED ORDER — RENA-VITE PO TABS
1.0000 | ORAL_TABLET | Freq: Every day | ORAL | Status: DC
  Administered 2022-07-18 – 2022-07-22 (×4): 1 via ORAL
  Filled 2022-07-18 (×8): qty 1

## 2022-07-18 MED ORDER — SERTRALINE HCL 50 MG PO TABS
50.0000 mg | ORAL_TABLET | Freq: Every day | ORAL | Status: DC
  Administered 2022-07-18 – 2022-07-22 (×4): 50 mg via ORAL
  Filled 2022-07-18 (×5): qty 1

## 2022-07-18 MED ORDER — EZETIMIBE 10 MG PO TABS
10.0000 mg | ORAL_TABLET | Freq: Every day | ORAL | Status: DC
  Administered 2022-07-18 – 2022-07-22 (×4): 10 mg via ORAL
  Filled 2022-07-18 (×5): qty 1

## 2022-07-18 MED ORDER — CLOPIDOGREL BISULFATE 75 MG PO TABS
75.0000 mg | ORAL_TABLET | Freq: Every day | ORAL | Status: DC
  Administered 2022-07-19 – 2022-07-22 (×3): 75 mg via ORAL
  Filled 2022-07-18 (×4): qty 1

## 2022-07-18 MED ORDER — INSULIN ASPART 100 UNIT/ML IJ SOLN
0.0000 [IU] | Freq: Every day | INTRAMUSCULAR | Status: DC
  Administered 2022-07-18 – 2022-07-19 (×2): 3 [IU] via SUBCUTANEOUS
  Administered 2022-07-21: 2 [IU] via SUBCUTANEOUS

## 2022-07-18 MED ORDER — HYDROXYZINE HCL 25 MG PO TABS: 25.0000 mg | ORAL_TABLET | Freq: Two times a day (BID) | ORAL | Status: DC | PRN

## 2022-07-18 MED ORDER — ONDANSETRON HCL 4 MG/2ML IJ SOLN: 4.0000 mg | Freq: Four times a day (QID) | INTRAMUSCULAR | Status: DC | PRN

## 2022-07-18 MED ORDER — ACETAMINOPHEN 650 MG RE SUPP: 650.0000 mg | Freq: Four times a day (QID) | RECTAL | Status: DC | PRN

## 2022-07-18 MED ORDER — PREGABALIN 50 MG PO CAPS
50.0000 mg | ORAL_CAPSULE | Freq: Three times a day (TID) | ORAL | Status: DC
  Administered 2022-07-18 – 2022-07-22 (×9): 50 mg via ORAL
  Filled 2022-07-18 (×2): qty 2
  Filled 2022-07-18 (×2): qty 1
  Filled 2022-07-18: qty 2
  Filled 2022-07-18: qty 1
  Filled 2022-07-18 (×3): qty 2

## 2022-07-18 MED ORDER — ACETAMINOPHEN 325 MG PO TABS: 650.0000 mg | ORAL_TABLET | Freq: Four times a day (QID) | ORAL | Status: DC | PRN

## 2022-07-18 MED ORDER — ASPIRIN 81 MG PO CHEW
162.0000 mg | CHEWABLE_TABLET | Freq: Every day | ORAL | Status: DC
  Administered 2022-07-18 – 2022-07-20 (×3): 162 mg via ORAL
  Filled 2022-07-18 (×4): qty 2

## 2022-07-18 MED ORDER — ONDANSETRON HCL 4 MG PO TABS: 4.0000 mg | ORAL_TABLET | Freq: Four times a day (QID) | ORAL | Status: DC | PRN

## 2022-07-18 MED ORDER — SUCROFERRIC OXYHYDROXIDE 500 MG PO CHEW: 500.0000 mg | CHEWABLE_TABLET | ORAL | Status: DC

## 2022-07-18 MED ORDER — METOPROLOL SUCCINATE ER 50 MG PO TB24
50.0000 mg | ORAL_TABLET | Freq: Every evening | ORAL | Status: DC
  Administered 2022-07-18 – 2022-07-21 (×4): 50 mg via ORAL
  Filled 2022-07-18 (×4): qty 1

## 2022-07-18 MED ORDER — INSULIN GLARGINE-YFGN 100 UNIT/ML ~~LOC~~ SOLN
30.0000 [IU] | Freq: Every day | SUBCUTANEOUS | Status: DC
  Administered 2022-07-18 – 2022-07-19 (×2): 30 [IU] via SUBCUTANEOUS
  Filled 2022-07-18 (×4): qty 0.3

## 2022-07-18 MED ORDER — SODIUM CHLORIDE 0.9% FLUSH: 3.0000 mL | INTRAVENOUS | Status: DC | PRN

## 2022-07-18 NOTE — ED Triage Notes (Signed)
Pt reports a wound to left 2nd toe that she noticed last night. States a nurse checked her yesterday and it was not there. 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

## 2022-07-18 NOTE — Assessment & Plan Note (Signed)
-  Stable overall -Continue the use of metoprolol -Heart healthy diet discussed with patient.

## 2022-07-18 NOTE — Assessment & Plan Note (Signed)
-  Heart healthy diet discussed with patient -Continue the use of Zetia

## 2022-07-18 NOTE — Assessment & Plan Note (Signed)
-  Patient currently on dialysis Monday, Wednesday and Friday -Last treatment on 07/17/2022 -Appears to be stable and volume compensated at the moment. -Nephrology service has been consulted to follow along and facilitate further dialysis treatment once transfer to [facility]. -Continue renal diet.

## 2022-07-18 NOTE — Assessment & Plan Note (Signed)
-  Body mass index is 35.99 kg/m. -Low-calorie diet and portion control discussed with patient.

## 2022-07-18 NOTE — Assessment & Plan Note (Addendum)
-  Patient presenting with left second toe ischemic changes -Prior history of peripheral vascular disease a status post right BKA -Decrease pulses and cold feeling -CT scan images without frank findings of enteritis -Case discussed with vascular surgery (Dr. Juanetta Gosling) recommendations given to transfer to [facility] for angiogram and definitive treatment. -Continue good blood pressure control, zetia and the use of aspirin and plavix.Kathryn Beck

## 2022-07-18 NOTE — ED Notes (Addendum)
Update of patient's medical condition given to daughter [name]).

## 2022-07-18 NOTE — Assessment & Plan Note (Signed)
-  Stable and adequately healing -No signs of superimposed infection. -Continue follow-up with vascular surgery/orthopedic service (Dr. Lajoyce Corners) as previously instructed

## 2022-07-18 NOTE — ED Provider Notes (Addendum)
Kathryn Beck EMERGENCY DEPARTMENT AT [facility] Provider Note   CSN: [id number] Arrival date & time: 07/18/22  1033     History  Chief Complaint  Patient presents with   Wound Check    Kathryn Beck is a 67 y.o. 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  She initially lost the nail on this toe, it is unclear whether she may have stubbed the toe or whether it just spontaneously fell away, but was seen by her PCP who put her on Septra and referred her to podiatry which she is awaiting referral.  In the interim, the wound on this toe has expanded to now include the  medial aspect of the toe and she has noticed dusky coloration of the toe as well.  She does have peripheral neuropathy, denies any significant pain at the site but does have chronic tingling of her lower extremity at baseline.  The history is provided by the patient and medical records.       Home Medications Prior to Admission medications   Medication Sig Start Date End Date Taking? Authorizing Provider  aspirin 81 MG chewable tablet CHEW 2 TABLETS BY MOUTH ONCE DAILY Patient taking differently: Chew 162 mg by mouth daily. 07/25/21   [name], MD  azelastine (OPTIVAR) 0.05 % ophthalmic solution Place 1 drop into both eyes 2 (two) times daily. 05/25/22   [name], MD  B Complex-C-Folic Acid (RENA-VITE RX) 1 MG TABS Take 1 tablet by mouth daily. 04/15/22   [provider]  Blood Glucose Monitoring Suppl (ONETOUCH VERIO) w/Device KIT Use to check blood sugar 4X daily. 04/16/22   Carlus Pavlov, MD  calcitRIOL (ROCALTROL) 0.25 MCG capsule TAKE 1 CAPSULE (0.25 MCG TOTAL) BY MOUTH 2 (TWO) TIMES DAILY WITH A MEAL.  06/05/22   Roma Kayser, MD  clopidogrel (PLAVIX) 75 MG tablet Take 1 tablet (75 mg total) by mouth daily with breakfast. 05/08/22 08/06/22  Dorcas Carrow, MD  Continuous Blood Gluc Receiver (FREESTYLE LIBRE 2 READER) DEVI 1 each by Does not apply route daily. 04/16/22   Carlus Pavlov, MD  Continuous Blood Gluc Sensor (FREESTYLE LIBRE 2 SENSOR) MISC 1 each by Does not apply route every 14 (fourteen) days. 04/16/22   Carlus Pavlov, MD  DULoxetine (CYMBALTA) 60 MG capsule TAKE (1) CAPSULE BY MOUTH ONCE DAILY. Patient taking differently: Take 60 mg by mouth daily. 09/17/20   [name], MD  ezetimibe (ZETIA) 10 MG tablet Take 1 tablet (10 mg total) by mouth daily. 05/25/22   [name], MD  furosemide (LASIX) 40 MG tablet Take 40 mg by mouth 2 (two) times daily.    [provider]  glucose blood (ONETOUCH VERIO) test strip Use as instructed to check blood sugar 4X daily. 04/16/22   Carlus Pavlov, MD  hydrOXYzine (ATARAX) 25 MG tablet Take 25 mg by mouth every 12 (twelve) hours as needed for itching.    [provider]  insulin aspart (FIASP FLEXTOUCH) 100 UNIT/ML FlexTouch Pen Inject 18-30 Units into the skin with breakfast, with lunch, and with evening meal. Patient taking differently: Inject 18-30 Units into the skin with breakfast, with lunch, and with evening meal. Sliding scale 05/25/22   Carlus Pavlov, MD  insulin  glargine, 2 Unit Dial, (TOUJEO MAX SOLOSTAR) 300 UNIT/ML Solostar Pen Inject 80 Units into the skin daily. 05/25/22   Carlus Pavlov, MD  Insulin Pen Needle 32G X 4 MM MISC Use 4x a day 05/25/22   Carlus Pavlov, MD  metoprolol succinate (TOPROL-XL) 50 MG 24 hr tablet TAKE (1) TABLET BY MOUTH DAILY WITH FOOD *TAKE AFTER DIALYSIS* Patient taking differently: Take 50 mg by mouth every evening. 07/29/21   [name], MD  OneTouch Delica Lancets 33G MISC Use to check blood sugar 4X daily. 04/16/22   Carlus Pavlov, MD   oxyCODONE-acetaminophen (PERCOCET/ROXICET) 5-325 MG tablet Take 1 tablet by mouth every 4 (four) hours as needed. 06/10/22   [name], MD  pregabalin (LYRICA) 50 MG capsule Take 1 capsule (50 mg total) by mouth 3 (three) times daily. 06/10/22 06/10/23  [name], MD  sertraline (ZOLOFT) 50 MG tablet TAKE ONE TABLET BY MOUTH EVERY DAY 07/13/22   [name], MD  sulfamethoxazole-trimethoprim (BACTRIM DS) 800-160 MG tablet Take 1 tablet by mouth 2 (two) times daily. 07/07/22   [name], MD  [name] 500 MG chewable tablet Chew 500-1,000 mg by mouth See admin instructions. Take 1000mg  (2 tablets) by mouth with meals and 500mg  (1 tablet) with snacks 04/15/22   [provider]  FLUoxetine (PROZAC) 10 MG capsule Take 10 mg by mouth daily.    05/28/11  [provider]  glipiZIDE (GLUCOTROL) 10 MG tablet Take 10 mg by mouth 2 (two) times daily before a meal.    05/28/11  [provider]      Allergies    Ace inhibitors, Penicillins, Statins, and Albuterol    Review of Systems   Review of Systems  Constitutional:  Negative for chills and fever.  HENT:  Negative for congestion and sore throat.   Eyes: Negative.   Respiratory:  Negative for chest tightness and shortness of breath.   Cardiovascular:  Negative for chest pain.  Gastrointestinal:  Negative for abdominal pain and nausea.  Genitourinary: Negative.   Musculoskeletal:  Negative for arthralgias, joint swelling and neck pain.  Skin:  Positive for color change and wound. Negative for rash.  Neurological:  Negative for dizziness, weakness, light-headedness, numbness and headaches.  Psychiatric/Behavioral: Negative.    All other systems reviewed and are negative.   Physical Exam Updated Vital Signs BP (!) 142/61 (BP Location: Right Arm)   Pulse 88   Temp 98.1 F (36.7 C) (Oral)   Resp 16   Ht 5\' 4"  (1.626 m)   Wt 95.1 kg   SpO2 94%   BMI 35.99 kg/m  Physical Exam Vitals and nursing  note reviewed.  Constitutional:      Appearance: She is well-developed.  HENT:     Head: Normocephalic and atraumatic.  Eyes:     Conjunctiva/sclera: Conjunctivae normal.  Cardiovascular:     Rate and Rhythm: Normal rate and regular rhythm.     Heart sounds: Normal heart sounds.  Pulmonary:     Effort: Pulmonary effort is normal.     Breath sounds: Normal breath sounds. No wheezing.  Abdominal:     General: Bowel sounds are normal.     Palpations: Abdomen is soft.     Tenderness: There is no abdominal tenderness.  Musculoskeletal:        General: No tenderness. Normal range of motion.     Cervical back: Normal range of motion.  Skin:    General: Skin is warm and dry.  Coloration: Skin is cyanotic.     Comments: Dusky left 2nd toe.  4-5 sec cap refill of toes.  Dorsalis pedal pulse is localized with bedside doppler.   Neurological:     Mental Status: She is alert.        ED Results / Procedures / Treatments   Labs (all labs ordered are listed, but only abnormal results are displayed) Labs Reviewed  CBC WITH DIFFERENTIAL/PLATELET - Abnormal; Notable for the following components:      Result Value   RBC 3.68 (*)    Hemoglobin 9.2 (*)    HCT 30.4 (*)    MCH 25.0 (*)    RDW 16.8 (*)    nRBC 0.6 (*)    All other components within normal limits  BASIC METABOLIC PANEL - Abnormal; Notable for the following components:   Glucose, Bld 179 (*)    Creatinine, Ser 4.61 (*)    GFR, Estimated 10 (*)    All other components within normal limits    EKG None  Radiology DG Foot Complete Left  Result Date: 07/18/2022 CLINICAL DATA:  Wound to left second toe. 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 EXAM: LEFT FOOT - COMPLETE 3+ VIEW COMPARISON:  07/06/2021 FINDINGS: There is diffuse soft tissue edema. Extensive vascular calcifications. Chronic fracture deformities involving the head of the second, third, and fourth metatarsal bones are identified with  increased surrounding callus formation. These are new when compared with the exam from 11/05/2021. Suspect healing fracture with increased callus formation and sclerosis involving the base of the fifth proximal phalanx. Hallux valgus deformity with degenerative changes at the first MTP joint identified. Focal bone loss with cortical irregularity involving the medial head of the first metatarsal bone is identified. This may be posttraumatic versus infectious in etiology. Progressive sclerosis is noted involving the lateral base of the first proximal phalanx. IMPRESSION: 1. Focal bone loss with cortical irregularity involving the medial head of the first metatarsal bone. This may be posttraumatic versus infectious in etiology. 2. Progressive sclerosis involving the lateral base of the first proximal phalanx. 3. Chronic, healing fracture deformities involving the head of the second, third, and fourth metatarsal bones with increased surrounding callus formation. 4. Diffuse soft tissue edema. 5. Hallux valgus deformity with degenerative changes at the first MTP joint. Electronically Signed   By: Signa Kell M.D.   On: 07/18/2022 11:40    Procedures Procedures    Medications Ordered in ED Medications - No data to display  ED Course/ Medical Decision Making/ A&P                             Medical Decision Making Pt presenting with dusky left 2nd toe and worsening wounds despite trial of abx.  Concerning for vascular insufficiency with early dry gangrene of the toe.    Amount and/or Complexity of Data Reviewed Labs: ordered.    Details: Normal wbc count, glucose well controlled Radiology: ordered.    Details: Imaging reviewed,  agree with interpretation,  Discussion of management or test interpretation with external provider(s): Pt discussed with Dr. Lenell Antu, vascular surgeon.  Recommends [facility] medical admission for vascular eval, angiogram of the extremity.   Patient discussed with Gwenlyn Perking who  accepts patient for admission  Risk Decision regarding hospitalization.           Final Clinical Impression(s) / ED Diagnoses Final diagnoses:  Peripheral vascular complication  PVD (peripheral  vascular disease) [facility])    Rx / DC Orders ED Discharge Orders     None         Victoriano Lain 07/18/22 1355    Burgess Amor, PA-C 07/18/22 1356    Eber Hong, MD 07/19/22 [phone]

## 2022-07-18 NOTE — Assessment & Plan Note (Signed)
-  Update A1c -Sliding scale has been initiated while inpatient -Follow CBGs fluctuation and adjust hypoglycemic regimen.

## 2022-07-18 NOTE — H&P (Signed)
History and Physical    Patient: Kathryn Beck:[id number] DOB: [birth year] DOA: 07/18/2022 DOS: the patient was seen and examined on 07/18/2022 PCP: [name], MD  Patient coming from: Home  Chief Complaint:  Chief Complaint  Patient presents with   Wound Check   HPI: Kathryn Beck is a 67 y.o. female with medical history significant of peripheral vascular disease a status post right BKA (approximately 1 month ago), hypertension, hyperlipidemia, type 2 diabetes with nephropathy, class II obesity, depression/anxiety and ESRD on dialysis; who presented to the hospital secondary to left second toe wound, numbness and discoloration.  Patient reports experiencing wound creation on the tip of her second toe approximately 5 days prior to admission and at that time seen by her PCP and placed on Septra; despite being compliant with antibiotics patient wound has changed color her toe feels cold and numb.  Patient presented to the emergency department for further evaluation and management.  She denies fever, chest pain, shortness of breath, nausea, vomiting, purulent discharge, swelling or pain outside her second left toe, focal weaknesses or any other complaints. Patient have hemodialysis on the day prior to admission well-tolerated without complications.  CT scan demonstrating chronic changes in her metatarsal joints and following chills; no acute osteomyelitis seen.  Case discussed with vascular surgery on-call who has recommended admission to [facility] for angiogram and and decisions regarding definitive treatment.   Review of Systems: As mentioned in the history of present illness. All other systems reviewed and are negative.  Past Medical History:  Diagnosis Date   Acid reflux    Anemia    Arthritis    Axillary masses    Soft tissue - status post excision   Back pain    COVID-19 virus infection 04/06/2019   Depression    End-stage renal disease (HCC)    M/W/F dialysis    Essential hypertension    Headache    years ago   History of blood transfusion    History of cardiac catheterization    Normal coronary arteries October 2020   History of claustrophobia    History of pneumonia 2019   Hypoxia 04/03/2019   Mixed hyperlipidemia    Obesity    Pancreatitis    Peritoneal dialysis catheter in place [facility])    Pneumonia due to COVID-19 virus 04/02/2019   Sleep apnea    Noncompliant with CPAP   Stroke (HCC)    mini stroke   Type 2 diabetes mellitus (HCC)    Past Surgical History:  Procedure Laterality Date   ABDOMINAL AORTOGRAM W/LOWER EXTREMITY N/A 04/30/2022   Procedure: ABDOMINAL AORTOGRAM W/LOWER EXTREMITY;  Surgeon: Leonie Douglas, MD;  Location: [facility] INVASIVE CV LAB;  Service: Cardiovascular;  Laterality: N/A;   ABDOMINAL HYSTERECTOMY     AMPUTATION Right 05/29/2022   Procedure: RIGHT BELOW THE KNEE AMPUTATION;  Surgeon: [name], MD;  Location: [facility] OR;  Service: Orthopedics;  Laterality: Right;   AV FISTULA PLACEMENT Left 09/02/2017   Procedure: creation of left arm ARTERIOVENOUS (AV) FISTULA;  Surgeon: Nada Libman, MD;  Location: [facility] OR;  Service: Vascular;  Laterality: Left;   COLONOSCOPY  2008   Dr. Darrick Penna: normal    COLONOSCOPY N/A 12/18/2016   Dr. Darrick Penna: multiple tubular adenomas, internal hemorrhoids. Surveillance in 3 years    ESOPHAGEAL DILATION N/A 10/13/2015   Procedure: ESOPHAGEAL DILATION;  Surgeon: Malissa Hippo, MD;  Location: [facility] ENDO SUITE;  Service: Endoscopy;  Laterality: N/A;   ESOPHAGOGASTRODUODENOSCOPY  N/A 10/13/2015   Dr. Karilyn Cota: chronic gastritis on path, no H.pylori. Empiric dilation    ESOPHAGOGASTRODUODENOSCOPY N/A 12/18/2016   Dr. Darrick Penna: mild gastritis. BRAVO study revealed uncontrolled GERD. Dysphagia secondary to uncontrolled reflux   FOOT SURGERY Bilateral    "nerve"     LEFT HEART CATH AND CORONARY ANGIOGRAPHY N/A 12/29/2018   Procedure: LEFT HEART CATH AND CORONARY ANGIOGRAPHY;  Surgeon: Corky Crafts,  MD;  Location: [facility] INVASIVE CV LAB;  Service: Cardiovascular;  Laterality: N/A;   LOWER EXTREMITY ANGIOGRAPHY Right 05/04/2022   Procedure: Lower Extremity Angiography;  Surgeon: Victorino Sparrow, MD;  Location: [facility] INVASIVE CV LAB;  Service: Cardiovascular;  Laterality: Right;   LUNG BIOPSY     MASS EXCISION Right 01/09/2013   Procedure: EXCISION OF NEOPLASM OF RIGHT  AXILLA  AND EXCISION OF NEOPLASM OF LEFT AXILLA;  Surgeon: Dalia Heading, MD;  Location: [facility] ORS;  Service: General;  Laterality: Right;  procedure end @ 08:23   MYRINGOTOMY WITH TUBE PLACEMENT Bilateral 04/28/2017   Procedure: BILATERAL MYRINGOTOMY WITH TUBE PLACEMENT;  Surgeon: Newman Pies, MD;  Location: [facility] OR;  Service: ENT;  Laterality: Bilateral;   PERIPHERAL VASCULAR BALLOON ANGIOPLASTY Right 05/04/2022   Procedure: PERIPHERAL VASCULAR BALLOON ANGIOPLASTY;  Surgeon: Victorino Sparrow, MD;  Location: [facility] INVASIVE CV LAB;  Service: Cardiovascular;  Laterality: Right;  PT   PERIPHERAL VASCULAR INTERVENTION Right 05/04/2022   Procedure: PERIPHERAL VASCULAR INTERVENTION;  Surgeon: Victorino Sparrow, MD;  Location: [facility] INVASIVE CV LAB;  Service: Cardiovascular;  Laterality: Right;  SFA   REVISION OF ARTERIOVENOUS GORETEX GRAFT Left 05/04/2018   Procedure: TRANSPOSITION OF CEPHALIC VEIN ARTERIOVENOUS FISTULA LEFT ARM;  Surgeon: Larina Earthly, MD;  Location: [facility] OR;  Service: Vascular;  Laterality: Left;   SAVORY DILATION N/A 12/18/2016   Procedure: SAVORY DILATION;  Surgeon: West Bali, MD;  Location: [facility] ENDO SUITE;  Service: Endoscopy;  Laterality: N/A;   Social History:  reports that she has never smoked. She has never been exposed to tobacco smoke. She has never used smokeless tobacco. She reports that she does not drink alcohol and does not use drugs.  Allergies  Allergen Reactions   Ace Inhibitors Anaphylaxis and Swelling   Penicillins Itching, Swelling and Other (See Comments)    Did it involve swelling of the face/tongue/throat, SOB,  or low BP? Unknown Did it involve sudden or severe rash/hives, skin peeling, or any reaction on the inside of your mouth or nose? Unknown Did you need to seek medical attention at a hospital or doctor's office? Unknown When did it last happen?      years  If all above answers are "NO", may proceed with cephalosporin use.    Statins Other (See Comments)    elevated LFT's     Albuterol Swelling    Family History  Problem Relation Age of Onset   Hypertension Father    Hypercholesterolemia Father    Arthritis Father    Hypertension Sister    Hypercholesterolemia Sister    Breast cancer Sister    Hypertension Sister    Colon cancer Neg Hx    Colon polyps Neg Hx     Prior to Admission medications   Medication Sig Start Date End Date Taking? Authorizing Provider  aspirin 81 MG chewable tablet CHEW 2 TABLETS BY MOUTH ONCE DAILY Patient taking differently: Chew 162 mg by mouth daily. 07/25/21  Yes [name], MD  azelastine (OPTIVAR) 0.05 % ophthalmic solution Place 1 drop into  both eyes 2 (two) times daily. 05/25/22  Yes [name], MD  B Complex-C-Folic Acid (RENA-VITE RX) 1 MG TABS Take 1 tablet by mouth daily. 04/15/22  Yes [provider]  clopidogrel (PLAVIX) 75 MG tablet Take 1 tablet (75 mg total) by mouth daily with breakfast. 05/08/22 08/06/22 Yes Ghimire, Lyndel Safe, MD  DULoxetine (CYMBALTA) 60 MG capsule TAKE (1) CAPSULE BY MOUTH ONCE DAILY. Patient taking differently: Take 60 mg by mouth daily. 09/17/20  Yes [name], MD  ezetimibe (ZETIA) 10 MG tablet Take 1 tablet (10 mg total) by mouth daily. 05/25/22  Yes [name], MD  furosemide (LASIX) 80 MG tablet Take 80 mg by mouth 2 (two) times daily. 05/14/22  Yes [provider]  insulin aspart (FIASP FLEXTOUCH) 100 UNIT/ML FlexTouch Pen Inject 18-30 Units into the skin with breakfast, with lunch, and with evening meal. Patient taking differently: Inject 18-30 Units into the skin with  breakfast, with lunch, and with evening meal. Sliding scale 05/25/22  Yes Carlus Pavlov, MD  insulin glargine, 2 Unit Dial, (TOUJEO MAX SOLOSTAR) 300 UNIT/ML Solostar Pen Inject 80 Units into the skin daily. 05/25/22  Yes Carlus Pavlov, MD  metoprolol succinate (TOPROL-XL) 50 MG 24 hr tablet TAKE (1) TABLET BY MOUTH DAILY WITH FOOD *TAKE AFTER DIALYSIS* Patient taking differently: Take 50 mg by mouth every evening. 07/29/21  Yes [name], MD  pregabalin (LYRICA) 50 MG capsule Take 1 capsule (50 mg total) by mouth 3 (three) times daily. 06/10/22 06/10/23 Yes [name], MD  sertraline (ZOLOFT) 50 MG tablet TAKE ONE TABLET BY MOUTH EVERY DAY 07/13/22  Yes [name], MD  sulfamethoxazole-trimethoprim (BACTRIM DS) 800-160 MG tablet Take 1 tablet by mouth 2 (two) times daily. 07/07/22  Yes [name], MD  [name] 500 MG chewable tablet Chew 500-1,000 mg by mouth See admin instructions. Take 1000mg  (2 tablets) by mouth with meals and 500mg  (1 tablet) with snacks 04/15/22  Yes [provider]  Blood Glucose Monitoring Suppl (ONETOUCH VERIO) w/Device KIT Use to check blood sugar 4X daily. 04/16/22   Carlus Pavlov, MD  calcitRIOL (ROCALTROL) 0.25 MCG capsule TAKE 1 CAPSULE (0.25 MCG TOTAL) BY MOUTH 2 (TWO) TIMES DAILY WITH A MEAL. 06/05/22   Roma Kayser, MD  Continuous Blood Gluc Receiver (FREESTYLE LIBRE 2 READER) DEVI 1 each by Does not apply route daily. 04/16/22   Carlus Pavlov, MD  Continuous Blood Gluc Sensor (FREESTYLE LIBRE 2 SENSOR) MISC 1 each by Does not apply route every 14 (fourteen) days. 04/16/22   Carlus Pavlov, MD  glucose blood (ONETOUCH VERIO) test strip Use as instructed to check blood sugar 4X daily. 04/16/22   Carlus Pavlov, MD  hydrOXYzine (ATARAX) 25 MG tablet Take 25 mg by mouth every 12 (twelve) hours as needed for itching.    [provider]  Insulin Pen Needle 32G X 4 MM MISC Use 4x a day 05/25/22   Carlus Pavlov, MD  OneTouch Delica Lancets 33G MISC Use to check blood sugar 4X daily. 04/16/22   Carlus Pavlov, MD  oxyCODONE-acetaminophen (PERCOCET/ROXICET) 5-325 MG tablet Take 1 tablet by mouth every 4 (four) hours as needed. 06/10/22   [name], MD  FLUoxetine (PROZAC) 10 MG capsule Take 10 mg by mouth daily.    05/28/11  [provider]  glipiZIDE (GLUCOTROL) 10 MG tablet Take 10 mg by mouth 2 (two) times daily before a meal.    05/28/11  [provider]  Physical Exam: Vitals:   07/18/22 1054 07/18/22 1056 07/18/22 1445 07/18/22 1500  BP:  (!) 142/61 (!) 154/64 139/73  Pulse:  88 81   Resp:  16 (!) 21 (!) 21  Temp:  98.1 F (36.7 C)    TempSrc:  Oral    SpO2:  94% 95%   Weight: 95.1 kg     Height: 5\' 4"  (1.626 m)      General exam: Alert, awake, oriented x 3; in no major distress; no chest pain, no nausea, no vomiting. Respiratory system: Clear to auscultation. Respiratory effort normal.  Good saturation on room air.  No using accessory muscles Cardiovascular system: Rate controlled, no rubs, no gallops, no JVD. Gastrointestinal system: Abdomen is obese, nondistended, soft and nontender. No organomegaly or masses felt. Normal bowel sounds heard. Central nervous system: Alert and oriented. No focal neurological deficits. Extremities: Right BKA; left lower extremity with trace of edema appreciated; also with left second toe demonstrating  Cold feeling, decreased pulses and discoloration. Skin: No petechiae. Psychiatry: Judgement and insight appear normal. Mood & affect appropriate.   Data Reviewed: See: White blood cells 9.9, hemoglobin 9.2 and platelet count 333 K Basic metabolic panel: Sodium 139, potassium 4.3, chloride 102, bicarb 27, BUN 14, creatinine 4.61 and GFR 10.  Assessment and Plan: * PVD (peripheral vascular disease) (HCC) -Patient presenting with left second toe ischemic changes -Prior history of peripheral vascular disease a status post  right BKA -Decrease pulses and cold feeling -CT scan images without frank findings of enteritis -Case discussed with vascular surgery (Dr. Juanetta Gosling) recommendations given to transfer to [facility] for angiogram and definitive treatment. -Continue good blood pressure control, zetia and the use of aspirin and plavix.Marland Kitchen  ESRD on hemodialysis [facility]) -Patient currently on dialysis Monday, Wednesday and Friday -Last treatment on 07/17/2022 -Appears to be stable and volume compensated at the moment. -Nephrology service has been consulted to follow along and facilitate further dialysis treatment once transfer to [facility] -Continue renal diet.  Type 2 diabetes mellitus with hyperglycemia (HCC) -Update A1c -Sliding scale has been initiated while inpatient -Follow CBGs fluctuation and adjust hypoglycemic regimen.  S/P BKA (below knee amputation), right (HCC) -Stable and adequately healing -No signs of superimposed infection. -Continue follow-up with vascular surgery/orthopedic service (Dr. Lajoyce Corners) as previously instructed  HTN (hypertension) -Stable overall -Continue the use of metoprolol -Heart healthy diet discussed with patient.  Obesity -Body mass index is 35.99 kg/m. -Low-calorie diet and portion control discussed with patient.  Hyperlipidemia -Heart healthy diet discussed with patient -Continue the use of Zetia      Advance Care Planning:   Code Status: Full Code   Consults: Vascular surgery and nephrology service.  Family Communication: No family at bedside.  Severity of Illness: The appropriate patient status for this patient is INPATIENT. Inpatient status is judged to be reasonable and necessary in order to provide the required intensity of service to ensure the patient's safety. The patient's presenting symptoms, physical exam findings, and initial radiographic and laboratory data in the context of their chronic comorbidities is felt to place them at high  risk for further clinical deterioration. Furthermore, it is not anticipated that the patient will be medically stable for discharge from the hospital within 2 midnights of admission.   * I certify that at the point of admission it is my clinical judgment that the patient will require inpatient hospital care spanning beyond 2 midnights from the point of admission due to high intensity of  service, high risk for further deterioration and high frequency of surveillance required.*  Author: Vassie Loll, MD 07/18/2022 4:01 PM  For on call review [email or web address].

## 2022-07-19 ENCOUNTER — Inpatient Hospital Stay (HOSPITAL_COMMUNITY): Payer: Medicare PPO

## 2022-07-19 DIAGNOSIS — I739 Peripheral vascular disease, unspecified: Secondary | ICD-10-CM | POA: Diagnosis not present

## 2022-07-19 DIAGNOSIS — E782 Mixed hyperlipidemia: Secondary | ICD-10-CM | POA: Diagnosis not present

## 2022-07-19 DIAGNOSIS — N186 End stage renal disease: Secondary | ICD-10-CM | POA: Diagnosis not present

## 2022-07-19 DIAGNOSIS — I70244 Atherosclerosis of native arteries of left leg with ulceration of heel and midfoot: Secondary | ICD-10-CM

## 2022-07-19 DIAGNOSIS — L97509 Non-pressure chronic ulcer of other part of unspecified foot with unspecified severity: Secondary | ICD-10-CM

## 2022-07-19 DIAGNOSIS — Z992 Dependence on renal dialysis: Secondary | ICD-10-CM | POA: Diagnosis not present

## 2022-07-19 LAB — MAGNESIUM: Magnesium: 2 mg/dL (ref 1.7–2.4)

## 2022-07-19 LAB — RENAL FUNCTION PANEL
Albumin: 2.9 g/dL — ABNORMAL LOW (ref 3.5–5.0)
Anion gap: 11 (ref 5–15)
BUN: 27 mg/dL — ABNORMAL HIGH (ref 8–23)
CO2: 26 mmol/L (ref 22–32)
Calcium: 9.2 mg/dL (ref 8.9–10.3)
Chloride: 104 mmol/L (ref 98–111)
Creatinine, Ser: 5.96 mg/dL — ABNORMAL HIGH (ref 0.44–1.00)
GFR, Estimated: 7 mL/min — ABNORMAL LOW (ref >60)
Glucose, Bld: 235 mg/dL — ABNORMAL HIGH (ref 70–99)
Phosphorus: 3.9 mg/dL (ref 2.5–4.6)
Potassium: 3.7 mmol/L (ref 3.5–5.1)
Sodium: 141 mmol/L (ref 135–145)

## 2022-07-19 LAB — CBC
HCT: 28.5 % — ABNORMAL LOW (ref 36.0–46.0)
Hemoglobin: 8.9 g/dL — ABNORMAL LOW (ref 12.0–15.0)
MCH: 25.5 pg — ABNORMAL LOW (ref 26.0–34.0)
MCHC: 31.2 g/dL (ref 30.0–36.0)
MCV: 81.7 fL (ref 80.0–100.0)
Platelets: 295 10*3/uL (ref 150–400)
RBC: 3.49 MIL/uL — ABNORMAL LOW (ref 3.87–5.11)
RDW: 17 % — ABNORMAL HIGH (ref 11.5–15.5)
WBC: 7.1 10*3/uL (ref 4.0–10.5)
nRBC: 0.6 % — ABNORMAL HIGH (ref 0.0–0.2)

## 2022-07-19 LAB — LIPID PANEL
Cholesterol: 145 mg/dL (ref 0–200)
HDL: 31 mg/dL — ABNORMAL LOW (ref >40)
LDL Cholesterol: 83 mg/dL (ref 0–99)
Total CHOL/HDL Ratio: 4.7 RATIO
Triglycerides: 155 mg/dL — ABNORMAL HIGH (ref <150)
VLDL: 31 mg/dL (ref 0–40)

## 2022-07-19 LAB — HEPATITIS B SURFACE ANTIGEN: Hepatitis B Surface Ag: NONREACTIVE

## 2022-07-19 LAB — GLUCOSE, CAPILLARY
Glucose-Capillary: 82 mg/dL (ref 70–99)
Glucose-Capillary: 159 mg/dL — ABNORMAL HIGH (ref 70–99)
Glucose-Capillary: 187 mg/dL — ABNORMAL HIGH (ref 70–99)
Glucose-Capillary: 247 mg/dL — ABNORMAL HIGH (ref 70–99)

## 2022-07-19 LAB — VAS US ABI WITH/WO TBI: Left ABI: 1.49

## 2022-07-19 LAB — PHOSPHORUS: Phosphorus: 3.9 mg/dL (ref 2.5–4.6)

## 2022-07-19 MED ORDER — CHLORHEXIDINE GLUCONATE CLOTH 2 % EX PADS
6.0000 | MEDICATED_PAD | Freq: Every day | CUTANEOUS | Status: DC
  Administered 2022-07-20: 6 via TOPICAL

## 2022-07-19 NOTE — Consult Note (Addendum)
[facility] [place]  ASSESSMENT / PLAN: Kathryn Beck is a 67 y.o. female with atherosclerosis of native arteries of left lower extremity causing ulceration.  Recommend:  Complete cessation from all tobacco products. Blood glucose control with goal A1c < 7%. Blood pressure control with goal blood pressure < 140/90 mmHg. Lipid reduction therapy with goal LDL-C <100 mg/dL (<08 if symptomatic from PAD).  Aspirin 81mg  PO QD.  Atorvastatin 40-80mg  PO QD (or other "high intensity" statin therapy).  The patient is on best medical therapy for peripheral arterial disease. The patient has been counseled about the risks of tobacco use in atherosclerotic disease. The patient has been counseled to abstain from any tobacco use. An aortogram with bilateral lower extremity runoff angiography and Left lower extremity intervention and is indicated to better evaluate the patient's lower extremity circulation because of the limb threatening nature of the patient's diagnosis. Based on the patient's clinical exam and non-invasive data, we anticipate an endovascular intervention in the femoropopliteal and tibial vessels. Plan left lower extremity angiogram with possible intervention via right common femoral approach in cath lab 07/21/22.   CHIEF COMPLAINT: left foot pain  HISTORY OF PRESENT ILLNESS: Kathryn Beck is a 67 y.o. female well known to our service for ESRD, PAD. In februrary 2024, she developed gangrene of the toes of the right. She underwent angiography with me 04/30/22, but suffered respiratory failure so intervention could not be performed. She then underwent angiography 05/04/22. She underwent a multilevel intervention which was successful, but severely disadvantaged pedal flow was noted. 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 She has recovered from this well.   She presented to the hospital with left second and first / second interdigital toe  ulceration. Given her history and comorbidities, I recommended admission for workup and angiography. On my evaluation, the patient reports some pain about the foot. She is otherwise feeling fine. She reports good healing from her BKA.    Past Medical History:  Diagnosis Date   Acid reflux    Anemia    Arthritis    Axillary masses    Soft tissue - status post excision   Back pain    COVID-19 virus infection 04/06/2019   Depression    End-stage renal disease (HCC)    M/W/F dialysis   Essential hypertension    Headache    years ago   History of blood transfusion    History of cardiac catheterization    Normal coronary arteries October 2020   History of claustrophobia    History of pneumonia 2019   Hypoxia 04/03/2019   Mixed hyperlipidemia    Obesity    Pancreatitis    Peritoneal dialysis catheter in place [facility])    Pneumonia due to COVID-19 virus 04/02/2019   Sleep apnea    Noncompliant with CPAP   Stroke (HCC)    mini stroke   Type 2 diabetes mellitus (HCC)     Past Surgical History:  Procedure Laterality Date   ABDOMINAL AORTOGRAM W/LOWER EXTREMITY N/A 04/30/2022   Procedure: ABDOMINAL AORTOGRAM W/LOWER EXTREMITY;  Surgeon: Leonie Douglas, MD;  Location: [facility] INVASIVE CV LAB;  Service: Cardiovascular;  Laterality: N/A;   ABDOMINAL HYSTERECTOMY     AMPUTATION Right 05/29/2022   Procedure: RIGHT BELOW THE KNEE AMPUTATION;  Surgeon: [name], MD;  Location: [facility] OR;  Service: Orthopedics;  Laterality: Right;   AV FISTULA PLACEMENT Left 09/02/2017   Procedure: creation of left arm ARTERIOVENOUS (AV) FISTULA;  Surgeon: Nada Libman, MD;  Location: [facility] OR;  Service: Vascular;  Laterality: Left;   COLONOSCOPY  2008   Dr. Darrick Penna: normal    COLONOSCOPY N/A 12/18/2016   Dr. Darrick Penna: multiple tubular adenomas, internal hemorrhoids. Surveillance in 3 years    ESOPHAGEAL DILATION N/A 10/13/2015   Procedure: ESOPHAGEAL DILATION;  Surgeon: Malissa Hippo, MD;  Location: [facility] ENDO  SUITE;  Service: Endoscopy;  Laterality: N/A;   ESOPHAGOGASTRODUODENOSCOPY N/A 10/13/2015   Dr. Karilyn Cota: chronic gastritis on path, no H.pylori. Empiric dilation    ESOPHAGOGASTRODUODENOSCOPY N/A 12/18/2016   Dr. Darrick Penna: mild gastritis. BRAVO study revealed uncontrolled GERD. Dysphagia secondary to uncontrolled reflux   FOOT SURGERY Bilateral    "nerve"     LEFT HEART CATH AND CORONARY ANGIOGRAPHY N/A 12/29/2018   Procedure: LEFT HEART CATH AND CORONARY ANGIOGRAPHY;  Surgeon: Corky Crafts, MD;  Location: [facility] INVASIVE CV LAB;  Service: Cardiovascular;  Laterality: N/A;   LOWER EXTREMITY ANGIOGRAPHY Right 05/04/2022   Procedure: Lower Extremity Angiography;  Surgeon: Victorino Sparrow, MD;  Location: [facility] INVASIVE CV LAB;  Service: Cardiovascular;  Laterality: Right;   LUNG BIOPSY     MASS EXCISION Right 01/09/2013   Procedure: EXCISION OF NEOPLASM OF RIGHT  AXILLA  AND EXCISION OF NEOPLASM OF LEFT AXILLA;  Surgeon: Dalia Heading, MD;  Location: [facility] ORS;  Service: General;  Laterality: Right;  procedure end @ 08:23   MYRINGOTOMY WITH TUBE PLACEMENT Bilateral 04/28/2017   Procedure: BILATERAL MYRINGOTOMY WITH TUBE PLACEMENT;  Surgeon: Newman Pies, MD;  Location: [facility] OR;  Service: ENT;  Laterality: Bilateral;   PERIPHERAL VASCULAR BALLOON ANGIOPLASTY Right 05/04/2022   Procedure: PERIPHERAL VASCULAR BALLOON ANGIOPLASTY;  Surgeon: Victorino Sparrow, MD;  Location: [facility] INVASIVE CV LAB;  Service: Cardiovascular;  Laterality: Right;  PT   PERIPHERAL VASCULAR INTERVENTION Right 05/04/2022   Procedure: PERIPHERAL VASCULAR INTERVENTION;  Surgeon: Victorino Sparrow, MD;  Location: [facility] INVASIVE CV LAB;  Service: Cardiovascular;  Laterality: Right;  SFA   REVISION OF ARTERIOVENOUS GORETEX GRAFT Left 05/04/2018   Procedure: TRANSPOSITION OF CEPHALIC VEIN ARTERIOVENOUS FISTULA LEFT ARM;  Surgeon: Larina Earthly, MD;  Location: [facility] OR;  Service: Vascular;  Laterality: Left;   SAVORY DILATION N/A 12/18/2016   Procedure: SAVORY  DILATION;  Surgeon: West Bali, MD;  Location: [facility] ENDO SUITE;  Service: Endoscopy;  Laterality: N/A;    Family History  Problem Relation Age of Onset   Hypertension Father    Hypercholesterolemia Father    Arthritis Father    Hypertension Sister    Hypercholesterolemia Sister    Breast cancer Sister    Hypertension Sister    Colon cancer Neg Hx    Colon polyps Neg Hx     Social History   Socioeconomic History   Marital status: Married    Spouse name: Teneka Malmberg   Number of children: 2   Years of education: 12   Highest education level: 12th grade  Occupational History   Occupation: retired   Tobacco Use   Smoking status: Never    Passive exposure: Never   Smokeless tobacco: Never   Tobacco comments:    Verified by Daughter, [name]  Vaping Use   Vaping Use: Never used  Substance and Sexual Activity   Alcohol use: No   Drug use: No   Sexual activity: Not Currently    Partners: Male  Other Topics Concern   Not on file  Social History Narrative   Lives alone with  husband    Social Determinants of Health   Financial Resource Strain: Medium Risk (05/12/2022)   Overall Financial Resource Strain (CARDIA)    Difficulty of Paying Living Expenses: Somewhat hard  Food Insecurity: No Food Insecurity (07/18/2022)   Hunger Vital Sign    Worried About Running Out of Food in the Last Year: Never true    Ran Out of Food in the Last Year: Never true  Transportation Needs: No Transportation Needs (07/18/2022)   PRAPARE - Administrator, Civil Service (Medical): No    Lack of Transportation (Non-Medical): No  Physical Activity: Inactive (05/12/2022)   Exercise Vital Sign    Days of Exercise per Week: 0 days    Minutes of Exercise per Session: 0 min  Stress: No Stress Concern Present (05/12/2022)   [employer] of Occupational Health - Occupational Stress Questionnaire    Feeling of Stress : Not at all  Social Connections: Socially Integrated  (05/12/2022)   Social Connection and Isolation Panel [NHANES]    Frequency of Communication with Friends and Family: More than three times a week    Frequency of Social Gatherings with Friends and Family: More than three times a week    Attends Religious Services: More than 4 times per year    Active Member of [employer] or Organizations: Yes    Attends Banker Meetings: 1 to 4 times per year    Marital Status: Married  Catering manager Violence: Not At Risk (07/18/2022)   Humiliation, Afraid, Rape, and Kick questionnaire    Fear of Current or Ex-Partner: No    Emotionally Abused: No    Physically Abused: No    Sexually Abused: No    Allergies  Allergen Reactions   Ace Inhibitors Anaphylaxis and Swelling   Penicillins Itching, Swelling and Other (See Comments)    Did it involve swelling of the face/tongue/throat, SOB, or low BP? Unknown Did it involve sudden or severe rash/hives, skin peeling, or any reaction on the inside of your mouth or nose? Unknown Did you need to seek medical attention at a hospital or doctor's office? Unknown When did it last happen?      years  If all above answers are "NO", may proceed with cephalosporin use.    Statins Other (See Comments)    elevated LFT's     Albuterol Swelling    Current Facility-Administered Medications  Medication Dose Route Frequency Provider Last Rate Last Admin   0.9 %  sodium chloride infusion  250 mL Intravenous PRN Vassie Loll, MD       acetaminophen (TYLENOL) tablet 650 mg  650 mg Oral Q6H PRN Vassie Loll, MD       Or   acetaminophen (TYLENOL) suppository 650 mg  650 mg Rectal Q6H PRN Vassie Loll, MD       aspirin chewable tablet 162 mg  162 mg Oral Daily Vassie Loll, MD   162 mg at 07/18/22 1715   calcitRIOL (ROCALTROL) capsule 0.25 mcg  0.25 mcg Oral BID WC Vassie Loll, MD   0.25 mcg at 07/18/22 1725   clopidogrel (PLAVIX) tablet 75 mg  75 mg Oral Q breakfast Vassie Loll, MD       DULoxetine  (CYMBALTA) DR capsule 60 mg  60 mg Oral Daily Vassie Loll, MD   60 mg at 07/18/22 1726   ezetimibe (ZETIA) tablet 10 mg  10 mg Oral Daily Vassie Loll, MD   10 mg at 07/18/22 1726   heparin injection  5,000 Units  5,000 Units Subcutaneous Q8H Vassie Loll, MD   5,000 Units at 07/19/22 9147   hydrOXYzine (ATARAX) tablet 25 mg  25 mg Oral Q12H PRN Vassie Loll, MD       insulin aspart (novoLOG) injection 0-5 Units  0-5 Units Subcutaneous QHS Vassie Loll, MD   3 Units at 07/18/22 2259   insulin aspart (novoLOG) injection 0-6 Units  0-6 Units Subcutaneous TID [facility] Vassie Loll, MD   2 Units at 07/18/22 1734   insulin glargine-yfgn (SEMGLEE) injection 30 Units  30 Units Subcutaneous QHS Vassie Loll, MD   30 Units at 07/18/22 2300   ketotifen (ZADITOR) 0.035 % ophthalmic solution 1 drop  1 drop Both Eyes BID Vassie Loll, MD   1 drop at 07/18/22 2309   metoprolol succinate (TOPROL-XL) 24 hr tablet 50 mg  50 mg Oral QPM Vassie Loll, MD   50 mg at 07/18/22 1726   multivitamin (RENA-VIT) tablet 1 tablet  1 tablet Oral Daily Vassie Loll, MD   1 tablet at 07/18/22 1727   ondansetron (ZOFRAN) tablet 4 mg  4 mg Oral Q6H PRN Vassie Loll, MD       Or   ondansetron [facility]) injection 4 mg  4 mg Intravenous Q6H PRN Vassie Loll, MD       pregabalin (LYRICA) capsule 50 mg  50 mg Oral TID Vassie Loll, MD   50 mg at 07/18/22 2259   sertraline (ZOLOFT) tablet 50 mg  50 mg Oral Daily Vassie Loll, MD   50 mg at 07/18/22 1715   sodium chloride flush (NS) 0.9 % injection 3 mL  3 mL Intravenous Q12H Vassie Loll, MD   3 mL at 07/18/22 2300   sodium chloride flush (NS) 0.9 % injection 3 mL  3 mL Intravenous PRN Vassie Loll, MD       sucroferric oxyhydroxide [employer]) chewable tablet 1,000 mg  1,000 mg Oral TID WC Vassie Loll, MD       And   sucroferric oxyhydroxide [facility]) chewable tablet 500 mg  500 mg Oral With snacks Vassie Loll, MD   1,000 mg at 07/18/22 2047    Facility-Administered Medications Ordered in Other Encounters  Medication Dose Route Frequency Provider Last Rate Last Admin   0.9 %  sodium chloride infusion   Intravenous Continuous Lockamy, Randi L, NP-C 20 mL/hr at 05/29/22 0928 New Bag at 05/29/22 1013    PHYSICAL EXAM Vitals:   07/18/22 2056 07/18/22 2300 07/19/22 0300 07/19/22 0628  BP:  (!) 155/55  (!) 159/58  Pulse:  75    Resp:  16  16  Temp: 98.7 F (37.1 C) 98.2 F (36.8 C)  98.1 F (36.7 C)  TempSrc: Oral Oral  Oral  SpO2:  93%  94%  Weight:   65.9 kg   Height:       Chronically ill woman in no distress Regular rate and rhythm Unlabored breathing 2+ femoral pulses No L popliteal pulse No L pedal pulses R AKA    PERTINENT LABORATORY AND RADIOLOGIC DATA  Most recent CBC    Latest Ref Rng & Units 07/18/2022   11:35 AM 07/07/2022   11:40 AM 06/10/2022    8:30 AM  CBC  WBC 4.0 - 10.5 K/uL 9.9  6.3  8.3   Hemoglobin 12.0 - 15.0 g/dL 9.2  9.9  9.0   Hematocrit 36.0 - 46.0 % 30.4  33.2  29.2   Platelets 150 - 400 K/uL 333  270  311  Most recent CMP    Latest Ref Rng & Units 07/18/2022   11:35 AM 07/07/2022   11:40 AM 06/10/2022    8:30 AM  CMP  Glucose 70 - 99 mg/dL 213  086  578   BUN 8 - 23 mg/dL 14  19  78   Creatinine 0.44 - 1.00 mg/dL 4.69  6.29  5.28   Sodium 135 - 145 mmol/L 139  142  141   Potassium 3.5 - 5.1 mmol/L 4.3  4.3  4.7   Chloride 98 - 111 mmol/L 102  100  98   CO2 22 - 32 mmol/L 27  25  26    Calcium 8.9 - 10.3 mg/dL 9.1  41.3  24.4   Total Protein 6.0 - 8.5 g/dL  6.6    Total Bilirubin 0.0 - 1.2 mg/dL  <0.1    Alkaline Phos 44 - 121 IU/L  112    AST 0 - 40 IU/L  23    ALT 0 - 32 IU/L  23      Renal function Estimated Creatinine Clearance: 11.2 mL/min (A) (by C-G formula based on SCr of 4.61 mg/dL (H)).  Hemoglobin-A1c (no units)  Date Value  08/22/2013 10.3   Hemoglobin A1C (no units)  Date Value  07/17/2019 7.8   HbA1c, POC (controlled diabetic range) (%)  Date  Value  06/18/2020 8.4 (A)   Hgb A1c MFr Bld (%)  Date Value  07/07/2022 6.3 (H)    LDL Cholesterol (Calc)  Date Value Ref Range Status  11/12/2017 109 (H) mg/dL (calc) Final    Comment:    Reference range: <100 . Desirable range <100 mg/dL for primary prevention;   <70 mg/dL for patients with CHD or diabetic patients  with > or = 2 CHD risk factors. Marland Kitchen LDL-C is now calculated using the Martin-Hopkins  calculation, which is a validated novel method providing  better accuracy than the Friedewald equation in the  estimation of LDL-C.  Horald Pollen et al. Lenox Ahr. 0272;536(64): 2061-2068  (http://education.[facility].com/faq/FAQ164)    LDL Cholesterol  Date Value Ref Range Status  05/01/2022 31 0 - 99 mg/dL Final    Comment:           Total Cholesterol/HDL:CHD Risk Coronary Heart Disease Risk Table                     Men   Women  1/2 Average Risk   3.4   3.3  Average Risk       5.0   4.4  2 X Average Risk   9.6   7.1  3 X Average Risk  23.4   11.0        Use the calculated Patient Ratio above and the CHD Risk Table to determine the patient's CHD Risk.        ATP III CLASSIFICATION (LDL):  <100     mg/dL   Optimal  403-474  mg/dL   Near or Above                    Optimal  130-159  mg/dL   Borderline  259-563  mg/dL   High  >875     mg/dL   Very High Performed at [facility], [address], [place], [place] [address]    Direct LDL  Date Value Ref Range Status  11/21/2020 120.0 mg/dL Final    Comment:    Optimal:  <100 mg/dLNear or Above Optimal:  100-129 mg/dLBorderline High:  130-159 mg/dLHigh:  160-189 mg/dLVery High:  >190 mg/dL       Rande Brunt. Lenell Antu, MD FACS [facility] Phone Number: [phone] 07/19/2022 9:15 AM   Total time spent on preparing this encounter including chart review, data review, collecting history, examining the patient, coordinating care for this established patient, 40  minutes.  Portions of this report may have been transcribed using voice recognition software.  Every effort has been made to ensure accuracy; however, inadvertent computerized transcription errors may still be present.

## 2022-07-19 NOTE — Progress Notes (Signed)
PROGRESS NOTE        PATIENT DETAILS Name: Kathryn Beck Age: 67 y.o. Sex: female Date of Birth: [birth year] Admit Date: 07/18/2022 Admitting Physician Vassie Loll, MD [id number], Milus Mallick, MD  Brief Summary: Patient is a 67 y.o.  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  Initially seen at [facility]-subsequently transferred to [facility] for vascular surgery evaluation.  Significant events: 4/27>> admit to [facility]  Significant studies: 4/27>> x-ray left foot: Focal bone loss involving the first metatarsal bone-posttraumatic versus infectious  Significant microbiology data: None  Procedures: None  Consults: Vascular surgery  Subjective: Lying comfortably in bed-denies any chest pain or shortness of breath.  Objective: Vitals: Blood pressure (!) 159/58, pulse 75, temperature 98.1 F (36.7 C), temperature source Oral, resp. rate 16, height 5\' 4"  (1.626 m), weight 65.9 kg, SpO2 94 %.   Exam: Gen Exam:Alert awake-not in any distress HEENT:atraumatic, normocephalic Chest: B/L clear to auscultation anteriorly CVS:S1S2 regular Abdomen:soft non tender, non distended Extremities:no edema-see below Neurology: Non focal Skin: no rash    Pertinent Labs/Radiology:    Latest Ref Rng & Units 07/18/2022   11:35 AM 07/07/2022   11:40 AM 06/10/2022    8:30 AM  CBC  WBC 4.0 - 10.5 K/uL 9.9  6.3  8.3   Hemoglobin 12.0 - 15.0 g/dL 9.2  9.9  9.0   Hematocrit 36.0 - 46.0 % 30.4  33.2  29.2   Platelets 150 - 400 K/uL 333  270  311     Lab Results  Component Value Date   NA 139 07/18/2022   K 4.3 07/18/2022   CL 102 07/18/2022   CO2 27 07/18/2022    Assessment/Plan: Critical left lower extremity ischemia-with great toe and second toe ulceration  PAD-s/p left BKA by Dr Lajoyce Corners 05/29/22 Continue antiplatelets Check  lipid panel-not on statin (LFT elevation)-but on Zetia Evaluated by vascular surgery-angiogram tentatively scheduled for 4/30 BKA stump intact.  ESRD on HD MWF Nephrology following for HD care  Normocytic anemia Secondary to ESRD Aranesp/ iron per nephrology.  DM-2 (A1c 6.3 on 4/16) CBG stable-Lantus 30 units daily along with SSI  Recent Labs    07/18/22 1711 07/18/22 2205 07/19/22 0910  GLUCAP 213* 274* 82    HTN BP stable Continue metoprolol  HLD Zetia  Peripheral neuropathy Likely related to diabetes Lyrica  BMI: Estimated body mass index is 24.94 kg/m as calculated from the following:   Height as of this encounter: 5\' 4"  (1.626 m).   Weight as of this encounter: 65.9 kg.   Code status:   Code Status: Full Code   DVT Prophylaxis: heparin injection 5,000 Units Start: 07/18/22 2200   Family Communication: None at bedside   Disposition Plan: Status is: Inpatient Remains inpatient appropriate because: Severity of illness   Planned Discharge Destination:Home health   Diet: Diet Order             Diet renal/carb modified with fluid restriction Diet-HS Snack? Nothing; Fluid restriction: 1800 mL Fluid; Room service appropriate? Yes; Fluid consistency: Thin  Diet effective now                     Antimicrobial agents: Anti-infectives (From admission, onward)  None        MEDICATIONS: Scheduled Meds:  aspirin  162 mg Oral Daily   calcitRIOL  0.25 mcg Oral BID WC   clopidogrel  75 mg Oral Q breakfast   DULoxetine  60 mg Oral Daily   ezetimibe  10 mg Oral Daily   heparin  5,000 Units Subcutaneous Q8H   insulin aspart  0-5 Units Subcutaneous QHS   insulin aspart  0-6 Units Subcutaneous TID WC   insulin glargine-yfgn  30 Units Subcutaneous QHS   ketotifen  1 drop Both Eyes BID   metoprolol succinate  50 mg Oral QPM   multivitamin  1 tablet Oral Daily   pregabalin  50 mg Oral TID   sertraline  50 mg Oral Daily   sodium chloride flush   3 mL Intravenous Q12H   sucroferric oxyhydroxide  1,000 mg Oral TID WC   And   sucroferric oxyhydroxide  500 mg Oral With snacks   Continuous Infusions:  sodium chloride     PRN Meds:.sodium chloride, acetaminophen **OR** acetaminophen, hydrOXYzine, ondansetron **OR** ondansetron (ZOFRAN) IV, sodium chloride flush   I have personally reviewed following labs and imaging studies  LABORATORY DATA: CBC: Recent Labs  Lab 07/18/22 1135  WBC 9.9  NEUTROABS 7.4  HGB 9.2*  HCT 30.4*  MCV 82.6  PLT 333    Basic Metabolic Panel: Recent Labs  Lab 07/18/22 1135  NA 139  K 4.3  CL 102  CO2 27  GLUCOSE 179*  BUN 14  CREATININE 4.61*  CALCIUM 9.1    GFR: Estimated Creatinine Clearance: 11.2 mL/min (A) (by C-G formula based on SCr of 4.61 mg/dL (H)).  Liver Function Tests: No results for input(s): "AST", "ALT", "ALKPHOS", "BILITOT", "PROT", "ALBUMIN" in the last 168 hours. No results for input(s): "LIPASE", "AMYLASE" in the last 168 hours. No results for input(s): "AMMONIA" in the last 168 hours.  Coagulation Profile: No results for input(s): "INR", "PROTIME" in the last 168 hours.  Cardiac Enzymes: No results for input(s): "CKTOTAL", "CKMB", "CKMBINDEX", "TROPONINI" in the last 168 hours.  BNP (last 3 results) No results for input(s): "PROBNP" in the last 8760 hours.  Lipid Profile: No results for input(s): "CHOL", "HDL", "LDLCALC", "TRIG", "CHOLHDL", "LDLDIRECT" in the last 72 hours.  Thyroid Function Tests: No results for input(s): "TSH", "T4TOTAL", "FREET4", "T3FREE", "THYROIDAB" in the last 72 hours.  Anemia Panel: No results for input(s): "VITAMINB12", "FOLATE", "FERRITIN", "TIBC", "IRON", "RETICCTPCT" in the last 72 hours.  Urine analysis:    Component Value Date/Time   COLORURINE YELLOW 11/07/2017 2340   APPEARANCEUR HAZY (A) 11/07/2017 2340   LABSPEC 1.017 11/07/2017 2340   PHURINE 5.0 11/07/2017 2340   GLUCOSEU 150 (A) 11/07/2017 2340   HGBUR  NEGATIVE 11/07/2017 2340   BILIRUBINUR NEGATIVE 11/07/2017 2340   BILIRUBINUR neg 02/15/2017 0913   KETONESUR NEGATIVE 11/07/2017 2340   PROTEINUR >=300 (A) 11/07/2017 2340   UROBILINOGEN 0.2 02/15/2017 0913   NITRITE NEGATIVE 11/07/2017 2340   LEUKOCYTESUR NEGATIVE 11/07/2017 2340    Sepsis Labs: Lactic Acid, Venous    Component Value Date/Time   LATICACIDVEN 1.6 12/02/2020 1939    MICROBIOLOGY: No results found for this or any previous visit (from the past 240 hour(s)).  RADIOLOGY STUDIES/RESULTS: DG Foot Complete Left  Result Date: 07/18/2022 CLINICAL DATA:  Wound to left second toe. 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 EXAM: LEFT FOOT - COMPLETE 3+ VIEW COMPARISON:  07/06/2021 FINDINGS: There is diffuse soft tissue edema. Extensive  vascular calcifications. Chronic fracture deformities involving the head of the second, third, and fourth metatarsal bones are identified with increased surrounding callus formation. These are new when compared with the exam from 11/05/2021. Suspect healing fracture with increased callus formation and sclerosis involving the base of the fifth proximal phalanx. Hallux valgus deformity with degenerative changes at the first MTP joint identified. Focal bone loss with cortical irregularity involving the medial head of the first metatarsal bone is identified. This may be posttraumatic versus infectious in etiology. Progressive sclerosis is noted involving the lateral base of the first proximal phalanx. IMPRESSION: 1. Focal bone loss with cortical irregularity involving the medial head of the first metatarsal bone. This may be posttraumatic versus infectious in etiology. 2. Progressive sclerosis involving the lateral base of the first proximal phalanx. 3. Chronic, healing fracture deformities involving the head of the second, third, and fourth metatarsal bones with increased surrounding callus formation. 4. Diffuse soft tissue edema. 5.  Hallux valgus deformity with degenerative changes at the first MTP joint. Electronically Signed   By: Signa Kell M.D.   On: 07/18/2022 11:40     LOS: 1 day   Jeoffrey Massed, MD  [facility]    To contact the attending provider between 7A-7P or the covering provider during after hours 7P-7A, please log into the web site [email or web address] and access using universal [place] password for that web site. If you do not have the password, please call the hospital operator.  07/19/2022, 9:24 AM

## 2022-07-19 NOTE — Consult Note (Addendum)
[facility] Renal Consultation Note    Indication for Consultation:  Management of ESRD/hemodialysis; anemia, hypertension/volume and secondary hyperparathyroidism  ZOX:WRUEAVW, Milus Mallick, MD  HPI: Kathryn Beck is a 67 y.o. female with ESRD on HD MWF at [facility].  Past medical history significant for DM, HTN, HLD, OSA, and PAD s/p R BKA (05/29/22 by Dr. Lajoyce Corners).  Patient presented to the ED yesterday due to wound on L 2nd toe with numbness and discoloration.  Reports it started about 5 days prior to coming to the ED and continued to worsen.  States she had a toe nail that broke and soon after she noticed the wound.  Denies pain, fever, chills, CP, SOB, abdominal pain, n/v/d, dizziness, weakness and fatigue.  Reports dialysis has been going well with no issues.  Last treatment on 4/26.  Pertinent findings during work up include ABI 1.49, and L foot xray with focal bone loss with cortical irregularity involving medial head of 1st metatarsal, chronic fractures, diffuse soft tissue edema.  Patient has been admitted for further evaluation and management.   Past Medical History:  Diagnosis Date   Acid reflux    Anemia    Arthritis    Axillary masses    Soft tissue - status post excision   Back pain    COVID-19 virus infection 04/06/2019   Depression    End-stage renal disease (HCC)    M/W/F dialysis   Essential hypertension    Headache    years ago   History of blood transfusion    History of cardiac catheterization    Normal coronary arteries October 2020   History of claustrophobia    History of pneumonia 2019   Hypoxia 04/03/2019   Mixed hyperlipidemia    Obesity    Pancreatitis    Peritoneal dialysis catheter in place [facility])    Pneumonia due to COVID-19 virus 04/02/2019   Sleep apnea    Noncompliant with CPAP   Stroke (HCC)    mini stroke   Type 2 diabetes mellitus (HCC)    Past Surgical History:  Procedure Laterality Date   ABDOMINAL AORTOGRAM  W/LOWER EXTREMITY N/A 04/30/2022   Procedure: ABDOMINAL AORTOGRAM W/LOWER EXTREMITY;  Surgeon: Leonie Douglas, MD;  Location: [facility] INVASIVE CV LAB;  Service: Cardiovascular;  Laterality: N/A;   ABDOMINAL HYSTERECTOMY     AMPUTATION Right 05/29/2022   Procedure: RIGHT BELOW THE KNEE AMPUTATION;  Surgeon: [name], MD;  Location: [facility] OR;  Service: Orthopedics;  Laterality: Right;   AV FISTULA PLACEMENT Left 09/02/2017   Procedure: creation of left arm ARTERIOVENOUS (AV) FISTULA;  Surgeon: Nada Libman, MD;  [facility];  Service: Vascular;  Laterality: Left;   COLONOSCOPY  2008   Dr. Darrick Penna: normal    COLONOSCOPY N/A 12/18/2016   Dr. Darrick Penna: multiple tubular adenomas, internal hemorrhoids. Surveillance in 3 years    ESOPHAGEAL DILATION N/A 10/13/2015   Procedure: ESOPHAGEAL DILATION;  Surgeon: Malissa Hippo, MD;  Location: [facility] ENDO SUITE;  Service: Endoscopy;  Laterality: N/A;   ESOPHAGOGASTRODUODENOSCOPY N/A 10/13/2015   Dr. Karilyn Cota: chronic gastritis on path, no H.pylori. Empiric dilation    ESOPHAGOGASTRODUODENOSCOPY N/A 12/18/2016   Dr. Darrick Penna: mild gastritis. BRAVO study revealed uncontrolled GERD. Dysphagia secondary to uncontrolled reflux   FOOT SURGERY Bilateral    "nerve"     LEFT HEART CATH AND CORONARY ANGIOGRAPHY N/A 12/29/2018   Procedure: LEFT HEART CATH AND CORONARY ANGIOGRAPHY;  Surgeon: Corky Crafts, MD;  Location: [facility] INVASIVE  CV LAB;  Service: Cardiovascular;  Laterality: N/A;   LOWER EXTREMITY ANGIOGRAPHY Right 05/04/2022   Procedure: Lower Extremity Angiography;  Surgeon: Victorino Sparrow, MD;  Location: [facility] INVASIVE CV LAB;  Service: Cardiovascular;  Laterality: Right;   LUNG BIOPSY     MASS EXCISION Right 01/09/2013   Procedure: EXCISION OF NEOPLASM OF RIGHT  AXILLA  AND EXCISION OF NEOPLASM OF LEFT AXILLA;  Surgeon: Dalia Heading, MD;  Location: [facility] ORS;  Service: General;  Laterality: Right;  procedure end @ 08:23   MYRINGOTOMY WITH TUBE PLACEMENT Bilateral  04/28/2017   Procedure: BILATERAL MYRINGOTOMY WITH TUBE PLACEMENT;  Surgeon: Newman Pies, MD;  Location: [facility] OR;  Service: ENT;  Laterality: Bilateral;   PERIPHERAL VASCULAR BALLOON ANGIOPLASTY Right 05/04/2022   Procedure: PERIPHERAL VASCULAR BALLOON ANGIOPLASTY;  Surgeon: Victorino Sparrow, MD;  Location: [facility] INVASIVE CV LAB;  Service: Cardiovascular;  Laterality: Right;  PT   PERIPHERAL VASCULAR INTERVENTION Right 05/04/2022   Procedure: PERIPHERAL VASCULAR INTERVENTION;  Surgeon: Victorino Sparrow, MD;  Location: [facility] INVASIVE CV LAB;  Service: Cardiovascular;  Laterality: Right;  SFA   REVISION OF ARTERIOVENOUS GORETEX GRAFT Left 05/04/2018   Procedure: TRANSPOSITION OF CEPHALIC VEIN ARTERIOVENOUS FISTULA LEFT ARM;  Surgeon: Larina Earthly, MD;  Location: [facility] OR;  Service: Vascular;  Laterality: Left;   SAVORY DILATION N/A 12/18/2016   Procedure: SAVORY DILATION;  Surgeon: West Bali, MD;  Location[facility] ENDO [facility];  Service: Endoscopy;  Laterality: N/A;   Family History  Problem Relation Age of Onset   Hypertension Father    Hypercholesterolemia Father    Arthritis Father    Hypertension Sister    Hypercholesterolemia Sister    Breast cancer Sister    Hypertension Sister    Colon cancer Neg Hx    Colon polyps Neg Hx    Social History:  reports that she has never smoked. She has never been exposed to tobacco smoke. She has never used smokeless tobacco. She reports that she does not drink alcohol and does not use drugs. Allergies  Allergen Reactions   Ace Inhibitors Anaphylaxis and Swelling   Penicillins Itching, Swelling and Other (See Comments)    Did it involve swelling of the face/tongue/throat, SOB, or low BP? Unknown Did it involve sudden or severe rash/hives, skin peeling, or any reaction on the inside of your mouth or nose? Unknown Did you need to seek medical attention at a hospital or doctor's office? Unknown When did it last happen?      years  If all above answers are "NO", may  proceed with cephalosporin use.    Statins Other (See Comments)    elevated LFT's     Albuterol Swelling   Prior to Admission medications   Medication Sig Start Date End Date Taking? Authorizing Provider  aspirin 81 MG chewable tablet CHEW 2 TABLETS BY MOUTH ONCE DAILY Patient taking differently: Chew 162 mg by mouth daily. 07/25/21  Yes [name], MD  azelastine (OPTIVAR) 0.05 % ophthalmic solution Place 1 drop into both eyes 2 (two) times daily. 05/25/22  Yes [name], MD  B Complex-C-Folic Acid (RENA-VITE RX) 1 MG TABS Take 1 tablet by mouth daily. 04/15/22  Yes [provider]  calcitRIOL (ROCALTROL) 0.25 MCG capsule TAKE 1 CAPSULE (0.25 MCG TOTAL) BY MOUTH 2 (TWO) TIMES DAILY WITH A MEAL. 06/05/22  Yes Roma Kayser, MD  clopidogrel (PLAVIX) 75 MG tablet Take 1 tablet (75 mg total) by mouth daily with breakfast.  05/08/22 08/06/22 Yes Ghimire, Lyndel Safe, MD  DULoxetine (CYMBALTA) 60 MG capsule TAKE (1) CAPSULE BY MOUTH ONCE DAILY. Patient taking differently: Take 60 mg by mouth daily. 09/17/20  Yes [name], MD  ezetimibe (ZETIA) 10 MG tablet Take 1 tablet (10 mg total) by mouth daily. 05/25/22  Yes [name], MD  furosemide (LASIX) 80 MG tablet Take 80 mg by mouth 2 (two) times daily. 05/14/22  Yes [provider]  hydrOXYzine (ATARAX) 25 MG tablet Take 25 mg by mouth every 12 (twelve) hours as needed for itching.   Yes [provider]  insulin aspart (FIASP FLEXTOUCH) 100 UNIT/ML FlexTouch Pen Inject 18-30 Units into the skin with breakfast, with lunch, and with evening meal. 05/25/22  Yes Carlus Pavlov, MD  insulin glargine, 2 Unit Dial, (TOUJEO MAX SOLOSTAR) 300 UNIT/ML Solostar Pen Inject 80 Units into the skin daily. Patient taking differently: Inject 80 Units into the skin at bedtime. 05/25/22  Yes Carlus Pavlov, MD  metoprolol succinate (TOPROL-XL) 50 MG 24 hr tablet TAKE (1) TABLET BY MOUTH DAILY WITH FOOD *TAKE  AFTER DIALYSIS* Patient taking differently: Take 50 mg by mouth daily. 07/29/21  Yes [name], MD  oxyCODONE-acetaminophen (PERCOCET/ROXICET) 5-325 MG tablet Take 1 tablet by mouth every 4 (four) hours as needed. Patient taking differently: Take 1 tablet by mouth every 4 (four) hours as needed for severe pain or moderate pain. 06/10/22  Yes [name], MD  pregabalin (LYRICA) 50 MG capsule Take 1 capsule (50 mg total) by mouth 3 (three) times daily. 06/10/22 06/10/23 Yes [name], MD  sertraline (ZOLOFT) 50 MG tablet TAKE ONE TABLET BY MOUTH EVERY DAY 07/13/22  Yes [name], MD  sulfamethoxazole-trimethoprim (BACTRIM DS) 800-160 MG tablet Take 1 tablet by mouth 2 (two) times daily. 07/07/22  Yes [name], MD  [name] 500 MG chewable tablet Chew 500-1,000 mg by mouth See admin instructions. Take 1000mg  (2 tablets) by mouth with meals and 500mg  (1 tablet) with snacks 04/15/22  Yes [provider]  Blood Glucose Monitoring Suppl (ONETOUCH VERIO) w/Device KIT Use to check blood sugar 4X daily. 04/16/22   Carlus Pavlov, MD  Continuous Blood Gluc Receiver (FREESTYLE LIBRE 2 READER) DEVI 1 each by Does not apply route daily. 04/16/22   Carlus Pavlov, MD  Continuous Blood Gluc Sensor (FREESTYLE LIBRE 2 SENSOR) MISC 1 each by Does not apply route every 14 (fourteen) days. 04/16/22   Carlus Pavlov, MD  glucose blood (ONETOUCH VERIO) test strip Use as instructed to check blood sugar 4X daily. 04/16/22   Carlus Pavlov, MD  Insulin Pen Needle 32G X 4 MM MISC Use 4x a day 05/25/22   Carlus Pavlov, MD  OneTouch Delica Lancets 33G MISC Use to check blood sugar 4X daily. 04/16/22   Carlus Pavlov, MD  FLUoxetine (PROZAC) 10 MG capsule Take 10 mg by mouth daily.    05/28/11  [provider]  glipiZIDE (GLUCOTROL) 10 MG tablet Take 10 mg by mouth 2 (two) times daily before a meal.    05/28/11  [provider]   Current Facility-Administered  Medications  Medication Dose Route Frequency Provider Last Rate Last Admin   0.9 %  sodium chloride infusion  250 mL Intravenous PRN Vassie Loll, MD       acetaminophen (TYLENOL) tablet 650 mg  650 mg Oral Q6H PRN Vassie Loll, MD       Or   acetaminophen (TYLENOL) suppository 650 mg  650 mg  Rectal Q6H PRN Vassie Loll, MD       aspirin chewable tablet 162 mg  162 mg Oral Daily Vassie Loll, MD   162 mg at 07/19/22 1024   calcitRIOL (ROCALTROL) capsule 0.25 mcg  0.25 mcg Oral BID WC Vassie Loll, MD   0.25 mcg at 07/19/22 1025   clopidogrel (PLAVIX) tablet 75 mg  75 mg Oral Q breakfast Vassie Loll, MD   75 mg at 07/19/22 1025   DULoxetine (CYMBALTA) DR capsule 60 mg  60 mg Oral Daily Vassie Loll, MD   60 mg at 07/19/22 1025   ezetimibe (ZETIA) tablet 10 mg  10 mg Oral Daily Vassie Loll, MD   10 mg at 07/19/22 1025   heparin injection 5,000 Units  5,000 Units Subcutaneous Q8H Vassie Loll, MD   5,000 Units at 07/19/22 4098   hydrOXYzine (ATARAX) tablet 25 mg  25 mg Oral Q12H PRN Vassie Loll, MD       insulin aspart (novoLOG) injection 0-5 Units  0-5 Units Subcutaneous QHS Vassie Loll, MD   3 Units at 07/18/22 2259   insulin aspart (novoLOG) injection 0-6 Units  0-6 Units Subcutaneous TID [facility] Vassie Loll, MD   2 Units at 07/18/22 1734   insulin glargine-yfgn (SEMGLEE) injection 30 Units  30 Units Subcutaneous QHS Vassie Loll, MD   30 Units at 07/18/22 2300   ketotifen (ZADITOR) 0.035 % ophthalmic solution 1 drop  1 drop Both Eyes BID Vassie Loll, MD   1 drop at 07/19/22 1041   metoprolol succinate (TOPROL-XL) 24 hr tablet 50 mg  50 mg Oral QPM Vassie Loll, MD   50 mg at 07/18/22 1726   multivitamin (RENA-VIT) tablet 1 tablet  1 tablet Oral Daily Vassie Loll, MD   1 tablet at 07/19/22 1025   ondansetron (ZOFRAN) tablet 4 mg  4 mg Oral Q6H PRN Vassie Loll, MD       Or   ondansetron [facility]) injection 4 mg  4 mg Intravenous Q6H PRN Vassie Loll, MD        pregabalin (LYRICA) capsule 50 mg  50 mg Oral TID Vassie Loll, MD   50 mg at 07/19/22 1025   sertraline (ZOLOFT) tablet 50 mg  50 mg Oral Daily Vassie Loll, MD   50 mg at 07/19/22 1025   sodium chloride flush (NS) 0.9 % injection 3 mL  3 mL Intravenous Q12H Vassie Loll, MD   3 mL at 07/19/22 1025   sodium chloride flush (NS) 0.9 % injection 3 mL  3 mL Intravenous PRN Vassie Loll, MD       sucroferric oxyhydroxide [facility]) chewable tablet 1,000 mg  1,000 mg Oral TID WC Vassie Loll, MD       And   sucroferric oxyhydroxide [facility]) chewable tablet 500 mg  500 mg Oral With snacks Vassie Loll, MD   1,000 mg at 07/18/22 2047   Facility-Administered Medications Ordered in Other Encounters  Medication Dose Route Frequency Provider Last Rate Last Admin   0.9 %  sodium chloride infusion   Intravenous Continuous Lockamy, Randi L, NP-C 20 mL/hr at 05/29/22 0928 New Bag at 05/29/22 1013   Labs: Basic Metabolic Panel: Recent Labs  Lab 07/18/22 1135  NA 139  K 4.3  CL 102  CO2 27  GLUCOSE 179*  BUN 14  CREATININE 4.61*  CALCIUM 9.1   CBC: Recent Labs  Lab 07/18/22 1135  WBC 9.9  NEUTROABS 7.4  HGB 9.2*  HCT 30.4*  MCV 82.6  PLT  333    CBG: Recent Labs  Lab 07/18/22 1711 07/18/22 2205 07/19/22 0910  GLUCAP 213* 274* 82   Studies/Results: VAS [place] ABI WITH/WO TBI  Result Date: 07/19/2022  LOWER EXTREMITY DOPPLER STUDY Patient Name:  EMIKA TIANO  Date of Exam:   07/19/2022 Medical Rec #: [id number]        Accession #:    [id number] Date of Birth: 12-14-1955        Patient Gender: F Patient Age:   24 years Exam Location:  [facility] Procedure:      VAS [place] ABI WITH/WO TBI Referring Phys: Heath Lark --------------------------------------------------------------------------------  Indications: Ulceration of left 1st and 2nd toes, and peripheral artery              disease. High Risk Factors: Hypertension, hyperlipidemia, Diabetes.  Vascular  Interventions: Right BKA 05/2022. Comparison Study: Prior ABI done 04/25/22 Performing Technologist: Sherren Kerns RVS  Examination Guidelines: A complete evaluation includes at minimum, Doppler waveform signals and systolic blood pressure reading at the level of bilateral brachial, anterior tibial, and posterior tibial arteries, when vessel segments are accessible. Bilateral testing is considered an integral part of a complete examination. Photoelectric Plethysmograph (PPG) waveforms and toe systolic pressure readings are included as required and additional duplex testing as needed. Limited examinations for reoccurring indications may be performed as noted.  ABI Findings: +---------+------------------+-----+---------+--------+ Right    Rt Pressure (mmHg)IndexWaveform Comment  +---------+------------------+-----+---------+--------+ Brachial 170                    triphasic         +---------+------------------+-----+---------+--------+ PTA                                      BKA      +---------+------------------+-----+---------+--------+ DP                                       BKA      +---------+------------------+-----+---------+--------+ Great Toe                                BKA      +---------+------------------+-----+---------+--------+ +---------+------------------+-----+----------+-------------------+ Left     Lt Pressure (mmHg)IndexWaveform  Comment             +---------+------------------+-----+----------+-------------------+ Brachial                                  restricted dialysis +---------+------------------+-----+----------+-------------------+ PTA      254               1.49 monophasic                    +---------+------------------+-----+----------+-------------------+ DP       98                0.58 monophasic                    +---------+------------------+-----+----------+-------------------+ Great Toe31                0.18                                +---------+------------------+-----+----------+-------------------+ +-------+-----------+-----------+------------+------------+  ABI/TBIToday's ABIToday's TBIPrevious ABIPrevious TBI +-------+-----------+-----------+------------+------------+ Right  BKA        BKA        0.40        absent       +-------+-----------+-----------+------------+------------+ Left   1.49       0.18       0.42        absent       +-------+-----------+-----------+------------+------------+  Compared to prior study on 04/25/22.  Summary: Right: BKA. Left: Resting left ankle-brachial index indicates noncompressible left lower extremity arteries. The left toe-brachial index is abnormal. *See table(s) above for measurements and observations.  Electronically signed by Heath Lark on 07/19/2022 at 10:56:56 AM.    Final    DG Foot Complete Left  Result Date: 07/18/2022 CLINICAL DATA:  Wound to left second toe. 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 EXAM: LEFT FOOT - COMPLETE 3+ VIEW COMPARISON:  07/06/2021 FINDINGS: There is diffuse soft tissue edema. Extensive vascular calcifications. Chronic fracture deformities involving the head of the second, third, and fourth metatarsal bones are identified with increased surrounding callus formation. These are new when compared with the exam from 11/05/2021. Suspect healing fracture with increased callus formation and sclerosis involving the base of the fifth proximal phalanx. Hallux valgus deformity with degenerative changes at the first MTP joint identified. Focal bone loss with cortical irregularity involving the medial head of the first metatarsal bone is identified. This may be posttraumatic versus infectious in etiology. Progressive sclerosis is noted involving the lateral base of the first proximal phalanx. IMPRESSION: 1. Focal bone loss with cortical irregularity involving the medial head of the first metatarsal bone. This may  be posttraumatic versus infectious in etiology. 2. Progressive sclerosis involving the lateral base of the first proximal phalanx. 3. Chronic, healing fracture deformities involving the head of the second, third, and fourth metatarsal bones with increased surrounding callus formation. 4. Diffuse soft tissue edema. 5. Hallux valgus deformity with degenerative changes at the first MTP joint. Electronically Signed   By: Signa Kell M.D.   On: 07/18/2022 11:40    ROS: All others negative except those listed in HPI.  Physical Exam: Vitals:   07/18/22 2056 07/18/22 2300 07/19/22 0300 07/19/22 0628  BP:  (!) 155/55  (!) 159/58  Pulse:  75    Resp:  16  16  Temp: 98.7 F (37.1 C) 98.2 F (36.8 C)  98.1 F (36.7 C)  TempSrc: Oral Oral  Oral  SpO2:  93%  94%  Weight:   65.9 kg   Height:         General: WDWN female in NAD Head: NCAT sclera not icteric MMM Neck: Supple. No JVD. Lungs: CTA bilaterally. No wheeze, rales or rhonchi. Breathing is unlabored. Heart: RRR. No murmur, rubs or gallops.  Abdomen: soft, nontender, +BS, no guarding, no rebound tenderness Lower extremities: R BKA, L 2nd digit darkened with distal dry gangrenous L sided wound on between 1st and 2nd digit Neuro: AAOx3. Moves all extremities spontaneously. Psych:  Responds to questions appropriately with a normal affect. Dialysis Access: LU AVF +b/t  Dialysis Orders:  Per last admission (can call tomorrow to update): [facility] MWF 3h   84kg  400/500   LUA AVF  15ga  Heparin 1000 units initial bolus then 1000u/hr, stop 1 hour before end of treatment (3200 units IV here).   Assessment/Plan:  L foot ulcer w/known PAD- [facility]VS consulted.  Plan for LLE angiogram with possible  intervention on 4/30.   ESRD -  on MWF.  Orders written for HD tomorrow per regular schedule.   Hypertension/volume  - Blood pressure well controlled. On Toprol 50mg  qd.  Does not appear volume overloaded.  UF as tolerated.   Anemia of CKD -  Hgb 9.2 - check iron studies. Follow trends, may need ESA.  Secondary Hyperparathyroidism -  Calcium in goal, check phos. Continue binders. Not on VDRA.    Nutrition - Renal diet w/fluid restrictions DMT2 - per PMD  Virgina Norfolk, PA-C [facility] 07/19/2022, 11:24 AM   Seen and examined independently.  Agree with note and exam as documented above by Virgina Norfolk, PA extender and as noted here.  Ms. Vuncannon is a pleasant female with a history of ESRD on hemodialysis Monday Wednesday Friday at [facility], PAD, hypertension, type 2 diabetes mellitus, and sleep apnea who presented to the hospital with a left foot wound.  There is an ulcer between the second and great toe on her left foot.  She recently had a right BKA and I saw her during that admission.  Vascular is consulted for assistance with management.  Per VVS note, they are planning for left lower extremity angiogram with possible intervention in the Cath Lab on 07/21/2022.  She states that earlier they brought a food tray to her room but she was hardly able to reach it without assistance.  She did get to eat lunch.  HD has been going ok.   General adult female in bed in no acute distress HEENT normocephalic atraumatic extraocular movements intact sclera anicteric Neck supple trachea midline Lungs clear to auscultation bilaterally normal work of breathing at rest; on room air  Heart S1S2 no rub Abdomen soft nontender nondistended Extremities no edema left leg and trace edema right residual limb; right BKA Psych normal mood and affect Neuro - alert and oriented x 3 provides a history and follows commands; recognizes me Access - LUE AVF with bruit and thrill   Left foot ulcer - Per vascular surgery   ESRD - HD per MWF schedule - would get her updated outpatient HD orders tomorrow   HTN  - acceptable on current regimen   Anemia CKD  - Can get outpatient ESA orders. Obtain iron studies  Metabolic bone  disease/Secondary hyperparathyroidism - would get updated outpatient HD orders. Continue binders. Renal panel is ordered for AM   Thank you for the consult.  Please do not hesitate to contact me with any questions regarding our patient  Estanislado Emms, MD 07/19/2022 2:49 PM

## 2022-07-19 NOTE — Progress Notes (Signed)
VASCULAR LAB    ABI has been performed.  See CV proc for preliminary results.   Paquita Printy, RVT 07/19/2022, 9:58 AM

## 2022-07-20 DIAGNOSIS — E782 Mixed hyperlipidemia: Secondary | ICD-10-CM | POA: Diagnosis not present

## 2022-07-20 DIAGNOSIS — Z992 Dependence on renal dialysis: Secondary | ICD-10-CM | POA: Diagnosis not present

## 2022-07-20 DIAGNOSIS — I739 Peripheral vascular disease, unspecified: Secondary | ICD-10-CM | POA: Diagnosis not present

## 2022-07-20 DIAGNOSIS — N186 End stage renal disease: Secondary | ICD-10-CM | POA: Diagnosis not present

## 2022-07-20 LAB — CBC
HCT: 28.9 % — ABNORMAL LOW (ref 36.0–46.0)
Hemoglobin: 8.7 g/dL — ABNORMAL LOW (ref 12.0–15.0)
MCH: 25 pg — ABNORMAL LOW (ref 26.0–34.0)
MCHC: 30.1 g/dL (ref 30.0–36.0)
MCV: 83 fL (ref 80.0–100.0)
Platelets: 318 10*3/uL (ref 150–400)
RBC: 3.48 MIL/uL — ABNORMAL LOW (ref 3.87–5.11)
RDW: 16.9 % — ABNORMAL HIGH (ref 11.5–15.5)
WBC: 7.4 10*3/uL (ref 4.0–10.5)
nRBC: 0.5 % — ABNORMAL HIGH (ref 0.0–0.2)

## 2022-07-20 LAB — RENAL FUNCTION PANEL
Albumin: 2.8 g/dL — ABNORMAL LOW (ref 3.5–5.0)
Anion gap: 11 (ref 5–15)
BUN: 39 mg/dL — ABNORMAL HIGH (ref 8–23)
CO2: 26 mmol/L (ref 22–32)
Calcium: 9.1 mg/dL (ref 8.9–10.3)
Chloride: 105 mmol/L (ref 98–111)
Creatinine, Ser: 7.6 mg/dL — ABNORMAL HIGH (ref 0.44–1.00)
GFR, Estimated: 5 mL/min — ABNORMAL LOW (ref >60)
Glucose, Bld: 200 mg/dL — ABNORMAL HIGH (ref 70–99)
Phosphorus: 4.7 mg/dL — ABNORMAL HIGH (ref 2.5–4.6)
Potassium: 3.5 mmol/L (ref 3.5–5.1)
Sodium: 142 mmol/L (ref 135–145)

## 2022-07-20 LAB — GLUCOSE, CAPILLARY
Glucose-Capillary: 82 mg/dL (ref 70–99)
Glucose-Capillary: 166 mg/dL — ABNORMAL HIGH (ref 70–99)
Glucose-Capillary: 144 mg/dL — ABNORMAL HIGH (ref 70–99)
Glucose-Capillary: 146 mg/dL — ABNORMAL HIGH (ref 70–99)
Glucose-Capillary: 113 mg/dL — ABNORMAL HIGH (ref 70–99)

## 2022-07-20 MED ORDER — HEPARIN SODIUM (PORCINE) 1000 UNIT/ML IJ SOLN
3200.0000 [IU] | Freq: Once | INTRAMUSCULAR | Status: AC
  Administered 2022-07-20: 3200 [IU] via INTRAVENOUS
  Filled 2022-07-20: qty 4

## 2022-07-20 MED ORDER — INSULIN GLARGINE-YFGN 100 UNIT/ML ~~LOC~~ SOLN
26.0000 [IU] | Freq: Every day | SUBCUTANEOUS | Status: DC
  Administered 2022-07-22: 26 [IU] via SUBCUTANEOUS
  Filled 2022-07-20 (×6): qty 0.26

## 2022-07-20 NOTE — Progress Notes (Signed)
[facility] Progress Note   Subjective:   Seen on HD. Feeling well today, not in any pain. Denies SOB, CP, dizziness and nausea. BP is elevated.   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 Dialysis Access: LUE AVF accessed  Additional Objective Labs: Basic Metabolic Panel: Recent Labs  Lab 07/18/22 1135 07/19/22 0235 07/19/22 0236 07/20/22 0302  NA 139  --  141 142  K 4.3  --  3.7 3.5  CL 102  --  104 105  CO2 27  --  26 26  GLUCOSE 179*  --  235* 200*  BUN 14  --  27* 39*  CREATININE 4.61*  --  5.96* 7.60*  CALCIUM 9.1  --  9.2 9.1  PHOS  --  3.9 3.9 4.7*   Liver Function Tests: Recent Labs  Lab 07/19/22 0236 07/20/22 0302  ALBUMIN 2.9* 2.8*   CBC: Recent Labs  Lab 07/18/22 1135 07/19/22 0235 07/20/22 0302  WBC 9.9 7.1 7.4  NEUTROABS 7.4  --   --   HGB 9.2* 8.9* 8.7*  HCT 30.4* 28.5* 28.9*  MCV 82.6 81.7 83.0  PLT 333 295 318   Blood Culture    Component Value Date/Time   SDES  11/07/2017 2340    URINE, CLEAN CATCH Performed at [facility], [address], [place], [place] [address]    [facility]  11/07/2017 2340    NONE Performed at [facility], [address] [place], [place] [address]    CULT (A) 11/07/2017 2340    <10,000 COLONIES/mL INSIGNIFICANT GROWTH Performed at [facility], [address] [place], [place] [address]    REPTSTATUS 11/09/2017 FINAL 11/07/2017 2340     CBG: Recent Labs  Lab 07/19/22 0910 07/19/22 1153 07/19/22 1619 07/19/22 2126 07/20/22 0801  GLUCAP 82 159*  187* 247* 82    Studies/Results: VAS [place] ABI WITH/WO TBI  Result Date: 07/19/2022  LOWER EXTREMITY DOPPLER STUDY Patient Name:  KNIYAH KHUN  Date of Exam:   07/19/2022 Medical Rec #: [id number]        Accession #:    [id number] Date of Birth: [birth year]        Patient Gender: F Patient Age:   67 years Exam Location:  [facility] Procedure:      VAS [place] ABI WITH/WO TBI Referring Phys: Heath Lark --------------------------------------------------------------------------------  Indications: Ulceration of left 1st and 2nd toes, and peripheral artery              disease. High Risk Factors: Hypertension, hyperlipidemia, Diabetes.  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 Comparison Study: Prior ABI done 04/25/22 Performing Technologist: Sherren Kerns RVS  Examination Guidelines: A complete evaluation includes at minimum, Doppler waveform signals and systolic blood pressure reading at the level of bilateral brachial, anterior tibial, and posterior tibial arteries, when vessel segments are accessible. Bilateral testing is considered an integral part of a complete examination. Photoelectric Plethysmograph (PPG) waveforms and toe systolic  pressure readings are included as required and additional duplex testing as needed. Limited examinations for reoccurring indications may be performed as noted.  ABI Findings: +---------+------------------+-----+---------+--------+ Right    Rt Pressure (mmHg)IndexWaveform Comment  +---------+------------------+-----+---------+--------+ Brachial 170                    triphasic         +---------+------------------+-----+---------+--------+ PTA                                      BKA      +---------+------------------+-----+---------+--------+ DP                                       BKA      +---------+------------------+-----+---------+--------+ Great Toe                                BKA       +---------+------------------+-----+---------+--------+ +---------+------------------+-----+----------+-------------------+ Left     Lt Pressure (mmHg)IndexWaveform  Comment             +---------+------------------+-----+----------+-------------------+ Brachial                                  restricted dialysis +---------+------------------+-----+----------+-------------------+ PTA      254               1.49 monophasic                    +---------+------------------+-----+----------+-------------------+ DP       98                0.58 monophasic                    +---------+------------------+-----+----------+-------------------+ Great Toe31                0.18                               +---------+------------------+-----+----------+-------------------+ +-------+-----------+-----------+------------+------------+ ABI/TBIToday's ABIToday's TBIPrevious ABIPrevious TBI +-------+-----------+-----------+------------+------------+ Right  BKA        BKA        0.40        absent       +-------+-----------+-----------+------------+------------+ Left   1.49       0.18       0.42        absent       +-------+-----------+-----------+------------+------------+  Compared to prior study on 04/25/22.  Summary: Right: BKA. Left: Resting left ankle-brachial index indicates noncompressible left lower extremity arteries. The left toe-brachial index is abnormal. *See table(s) above for measurements and observations.  Electronically signed by Heath Lark on 07/19/2022 at 10:56:56 AM.    Final    DG Foot Complete Left  Result Date: 07/18/2022 CLINICAL DATA:  Wound to left second toe. 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 EXAM: LEFT FOOT - COMPLETE 3+ VIEW COMPARISON:  07/06/2021 FINDINGS: There is diffuse soft tissue edema. Extensive vascular calcifications. Chronic fracture deformities involving the head of the second, third, and fourth metatarsal  bones are identified with increased surrounding callus formation. These are new when compared with the exam  from 11/05/2021. Suspect healing fracture with increased callus formation and sclerosis involving the base of the fifth proximal phalanx. Hallux valgus deformity with degenerative changes at the first MTP joint identified. Focal bone loss with cortical irregularity involving the medial head of the first metatarsal bone is identified. This may be posttraumatic versus infectious in etiology. Progressive sclerosis is noted involving the lateral base of the first proximal phalanx. IMPRESSION: 1. Focal bone loss with cortical irregularity involving the medial head of the first metatarsal bone. This may be posttraumatic versus infectious in etiology. 2. Progressive sclerosis involving the lateral base of the first proximal phalanx. 3. Chronic, healing fracture deformities involving the head of the second, third, and fourth metatarsal bones with increased surrounding callus formation. 4. Diffuse soft tissue edema. 5. Hallux valgus deformity with degenerative changes at the first MTP joint. Electronically Signed   By: Signa Kell M.D.   On: 07/18/2022 11:40   Medications:  sodium chloride      aspirin  162 mg Oral Daily   calcitRIOL  0.25 mcg Oral BID WC   Chlorhexidine Gluconate Cloth  6 each Topical Q0600   clopidogrel  75 mg Oral Q breakfast   DULoxetine  60 mg Oral Daily   ezetimibe  10 mg Oral Daily   heparin  5,000 Units Subcutaneous Q8H   heparin sodium (porcine)  3,200 Units Intravenous Once   insulin aspart  0-5 Units Subcutaneous QHS   insulin aspart  0-6 Units Subcutaneous TID WC   insulin glargine-yfgn  26 Units Subcutaneous QHS   ketotifen  1 drop Both Eyes BID   metoprolol succinate  50 mg Oral QPM   multivitamin  1 tablet Oral Daily   pregabalin  50 mg Oral TID   sertraline  50 mg Oral Daily   sodium chloride flush  3 mL Intravenous Q12H   sucroferric oxyhydroxide  1,000 mg Oral  TID WC   And   sucroferric oxyhydroxide  500 mg Oral With snacks    Dialysis Orders: Per last admission  [facility] MWF 3h   84kg  400/500   LUA AVF  15ga  Heparin 1000 units initial bolus then 1000u/hr, stop 1 hour before end of treatment (3200 units IV here).   Assessment/Plan:  L foot ulcer w/known PAD- VVS consulted.  Plan for LLE angiogram with possible intervention on 4/30.   ESRD -  on MWF.  HD today per regular schedule, tolerating well.   Hypertension/volume  - Blood pressure well controlled in general- elevated today towards start of HD, monitor post HD. On Toprol 50mg  qd.  Does not appear volume overloaded.  UF as tolerated.   Anemia of CKD - Hgb 8.7 - checking iron studies. Follow trends, may need ESA.  Secondary Hyperparathyroidism -  Calcium and phos within goal. Not on VDRA. Continue phosphorus binders  Nutrition - Renal diet w/fluid restrictions DMT2 - per PMD  [name] Blocker, PA-C 07/20/2022, 10:18 AM  [facility] Pager: [phone]

## 2022-07-20 NOTE — Progress Notes (Signed)
  Transition of Care [facility]) Screening Note   Patient Details  Name: Kathryn Beck Date of Birth: 1955-04-10   Transition of Care [facility]) CM/SW Contact:    Mearl Latin, LCSW Phone Number: 07/20/2022, 2:39 PM    Transition of Care Department [facility]) has reviewed patient from home on OP hemodialysis. We will continue to monitor patient advancement through interdisciplinary progression rounds. If new patient transition needs arise, please place a TOC consult.

## 2022-07-20 NOTE — Inpatient Diabetes Management (Signed)
Inpatient Diabetes Program Recommendations  AACE/ADA: New Consensus Statement on Inpatient Glycemic Control  Target Ranges:  Prepandial:   less than 140 mg/dL      Peak postprandial:   less than 180 mg/dL (1-2 hours)      Critically ill patients:  140 - 180 mg/dL    Latest Reference Range & Units 07/20/22 03:02  Glucose 70 - 99 mg/dL 161 (H)    Latest Reference Range & Units 07/19/22 09:10 07/19/22 11:53 07/19/22 16:19 07/19/22 21:26  Glucose-Capillary 70 - 99 mg/dL 82 096 (H) 045 (H) 409 (H)   Review of Glycemic Control  Diabetes history: DM2 Outpatient Diabetes medications: Toujeo 80 units daily, Fiasp 18-30 units TID with meals Current orders for Inpatient glycemic control: Semglee 30 units daily, Novolog 0-6 units TID with meals, Novolog 0-5 units QHS  Inpatient Diabetes Program Recommendations:    Insulin: Please consider increasing Semglee to 33 units daily and ordering Novolog 2 units TID with meals for meal coverage if patient eats at least 50% of meals.  Thanks, Orlando Penner, RN, MSN, CDCES Diabetes Coordinator Inpatient Diabetes Program [phone] (Team Pager from 8am to 5pm)

## 2022-07-20 NOTE — Procedures (Signed)
I was present at this dialysis session. I have reviewed the session itself and made appropriate changes.   Vital signs in last 24 hours:  Temp:  [97.8 F (36.6 C)-98.2 F (36.8 C)] 97.8 F (36.6 C) (04/29 0803) Pulse Rate:  [69-88] 70 (04/29 1245) Resp:  [15-22] 17 (04/29 1245) BP: (142-202)/(57-117) 202/74 (04/29 1245) SpO2:  [92 %-98 %] 98 % (04/29 1245) Weight:  [83.7 kg] 83.7 kg (04/29 0300) Weight change: -11.4 kg Filed Weights   07/18/22 1054 07/19/22 0300 07/20/22 0300  Weight: 95.1 kg 65.9 kg 83.7 kg    Recent Labs  Lab 07/20/22 0302  NA 142  K 3.5  CL 105  CO2 26  GLUCOSE 200*  BUN 39*  CREATININE 7.60*  CALCIUM 9.1  PHOS 4.7*    Recent Labs  Lab 07/18/22 1135 07/19/22 0235 07/20/22 0302  WBC 9.9 7.1 7.4  NEUTROABS 7.4  --   --   HGB 9.2* 8.9* 8.7*  HCT 30.4* 28.5* 28.9*  MCV 82.6 81.7 83.0  PLT 333 295 318    Scheduled Meds:  aspirin  162 mg Oral Daily   calcitRIOL  0.25 mcg Oral BID WC   Chlorhexidine Gluconate Cloth  6 each Topical Q0600   clopidogrel  75 mg Oral Q breakfast   DULoxetine  60 mg Oral Daily   ezetimibe  10 mg Oral Daily   heparin  5,000 Units Subcutaneous Q8H   insulin aspart  0-5 Units Subcutaneous QHS   insulin aspart  0-6 Units Subcutaneous TID WC   insulin glargine-yfgn  26 Units Subcutaneous QHS   ketotifen  1 drop Both Eyes BID   metoprolol succinate  50 mg Oral QPM   multivitamin  1 tablet Oral Daily   pregabalin  50 mg Oral TID   sertraline  50 mg Oral Daily   sodium chloride flush  3 mL Intravenous Q12H   sucroferric oxyhydroxide  1,000 mg Oral TID WC   And   sucroferric oxyhydroxide  500 mg Oral With snacks   Continuous Infusions:  sodium chloride     PRN Meds:.sodium chloride, acetaminophen **OR** acetaminophen, hydrOXYzine, ondansetron **OR** ondansetron (ZOFRAN) IV, sodium chloride flush   Irena Cords,  MD 07/20/2022, 12:48 PM

## 2022-07-20 NOTE — Progress Notes (Addendum)
[facility]  Subjective  - Comfortable    Objective (!) 157/60 88 98.2 F (36.8 C) (Oral) 16 94% No intake or output data in the 24 hours ending 07/20/22 0657  Left second toe ulcer Motor and sensation grossly intact Right BKA Palpable femoral pulses  Assessment/Planning: Left LE non healing wound PAD and DM Plan left lower extremity angiogram with possible intervention via right common femoral approach in cath lab 07/21/22.  S/P right BKA by Dr. Lajoyce Corners 05/29/22  She has ESRD and is on HD MWF NPO past MN   Kathryn Beck 07/20/2022 6:57 AM --  Laboratory Lab Results: Recent Labs    07/18/22 1135 07/20/22 0302  WBC 9.9 7.4  HGB 9.2* 8.7*  HCT 30.4* 28.9*  PLT 333 318   BMET Recent Labs    07/18/22 1135 07/20/22 0302  NA 139 142  K 4.3 3.5  CL 102 105  CO2 27 26  GLUCOSE 179* 200*  BUN 14 39*  CREATININE 4.61* 7.60*  CALCIUM 9.1 9.1    COAG Lab Results  Component Value Date   INR 1.0 03/03/2019   INR 1.17 09/02/2017   No results found for: "PTT"   VASCULAR STAFF ADDENDUM: I have independently interviewed and examined the patient. I agree with the above.  Plan angiogram tomorrow.  All questions answered.  Rande Brunt. Lenell Antu, MD [facility] Phone Number: [phone] 07/20/2022 9:59 AM

## 2022-07-20 NOTE — Progress Notes (Signed)
PROGRESS NOTE        PATIENT DETAILS Name: Kathryn Beck Age: 67 y.o. Sex: female Date of Birth: 05-04-1955 Admit Date: 07/18/2022 Admitting Physician Vassie Loll, MD [id number], Milus Mallick, MD  Brief Summary: Patient is a 67 y.o.  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  Initially seen at [facility]-subsequently transferred to [facility] for vascular surgery evaluation.  Significant events: 4/27>> admit to [facility]  Significant studies: 4/27>> x-ray left foot: Focal bone loss involving the first metatarsal bone-posttraumatic versus infectious  Significant microbiology data: None  Procedures: None  Consults: Vascular surgery  Subjective: No major issues overnight-lying comfortably in bed.  Objective: Vitals: Blood pressure (!) 178/69, pulse 80, temperature 97.8 F (36.6 C), temperature source Oral, resp. rate 20, height 5\' 4"  (1.626 m), weight 83.7 kg, SpO2 96 %.   Exam: Gen Exam:Alert awake-not in any distress HEENT:atraumatic, normocephalic Chest: B/L clear to auscultation anteriorly CVS:S1S2 regular Abdomen:soft non tender, non distended Extremities:no edema.  Left foot toes-remain unchanged-see picture below-taken on 4/28.   Neurology: Non focal Skin: no rash    Pertinent Labs/Radiology:    Latest Ref Rng & Units 07/20/2022    3:02 AM 07/19/2022    2:35 AM 07/18/2022   11:35 AM  CBC  WBC 4.0 - 10.5 K/uL 7.4  7.1  9.9   Hemoglobin 12.0 - 15.0 g/dL 8.7  8.9  9.2   Hematocrit 36.0 - 46.0 % 28.9  28.5  30.4   Platelets 150 - 400 K/uL 318  295  333     Lab Results  Component Value Date   NA 142 07/20/2022   K 3.5 07/20/2022   CL 105 07/20/2022   CO2 26 07/20/2022    Assessment/Plan: Critical left lower extremity ischemia-with great toe and second toe ulceration  Continue  antiplatelets/Zetia-apparently intolerant to statin due to LFT elevation Vascular surgery following-angiogram scheduled for 4/30  PAD-s/p left BKA by Dr Lajoyce Corners 05/29/22 BKA stump intact.  ESRD on HD MWF Nephrology following for HD care  Normocytic anemia Secondary to ESRD Aranesp/ iron per nephrology.  DM-2 (A1c 6.3 on 4/16) CBG stable-borderline hypoglycemia-decrease Lantus to 26 units, continue SSI.   Recent Labs    07/19/22 1619 07/19/22 2126 07/20/22 0801  GLUCAP 187* 247* 82     HTN BP stable Continue metoprolol  HLD Zetia  Peripheral neuropathy Likely related to diabetes Lyrica  BMI: Estimated body mass index is 31.67 kg/m as calculated from the following:   Height as of this encounter: 5\' 4"  (1.626 m).   Weight as of this encounter: 83.7 kg.   Code status:   Code Status: Full Code   DVT Prophylaxis: heparin injection 5,000 Units Start: 07/18/22 2200   Family Communication: None at bedside   Disposition Plan: Status is: Inpatient Remains inpatient appropriate because: Severity of illness   Planned Discharge Destination:Home health   Diet: Diet Order             Diet NPO time specified Except for: Sips with Meds  Diet effective midnight           Diet renal/carb modified with fluid restriction Diet-HS Snack? Nothing; Fluid restriction: 1800 mL Fluid; Room service appropriate? Yes; Fluid consistency: Thin  Diet  effective now                     Antimicrobial agents: Anti-infectives (From admission, onward)    None        MEDICATIONS: Scheduled Meds:  aspirin  162 mg Oral Daily   calcitRIOL  0.25 mcg Oral BID WC   Chlorhexidine Gluconate Cloth  6 each Topical Q0600   clopidogrel  75 mg Oral Q breakfast   DULoxetine  60 mg Oral Daily   ezetimibe  10 mg Oral Daily   heparin  5,000 Units Subcutaneous Q8H   heparin sodium (porcine)  3,200 Units Intravenous Once   insulin aspart  0-5 Units Subcutaneous QHS   insulin aspart  0-6  Units Subcutaneous TID WC   insulin glargine-yfgn  30 Units Subcutaneous QHS   ketotifen  1 drop Both Eyes BID   metoprolol succinate  50 mg Oral QPM   multivitamin  1 tablet Oral Daily   pregabalin  50 mg Oral TID   sertraline  50 mg Oral Daily   sodium chloride flush  3 mL Intravenous Q12H   sucroferric oxyhydroxide  1,000 mg Oral TID WC   And   sucroferric oxyhydroxide  500 mg Oral With snacks   Continuous Infusions:  sodium chloride     PRN Meds:.sodium chloride, acetaminophen **OR** acetaminophen, hydrOXYzine, ondansetron **OR** ondansetron (ZOFRAN) IV, sodium chloride flush   I have personally reviewed following labs and imaging studies  LABORATORY DATA: CBC: Recent Labs  Lab 07/18/22 1135 07/19/22 0235 07/20/22 0302  WBC 9.9 7.1 7.4  NEUTROABS 7.4  --   --   HGB 9.2* 8.9* 8.7*  HCT 30.4* 28.5* 28.9*  MCV 82.6 81.7 83.0  PLT 333 295 318     Basic Metabolic Panel: Recent Labs  Lab 07/18/22 1135 07/19/22 0235 07/19/22 0236 07/20/22 0302  NA 139  --  141 142  K 4.3  --  3.7 3.5  CL 102  --  104 105  CO2 27  --  26 26  GLUCOSE 179*  --  235* 200*  BUN 14  --  27* 39*  CREATININE 4.61*  --  5.96* 7.60*  CALCIUM 9.1  --  9.2 9.1  MG  --  2.0  --   --   PHOS  --  3.9 3.9 4.7*     GFR: Estimated Creatinine Clearance: 7.6 mL/min (A) (by C-G formula based on SCr of 7.6 mg/dL (H)).  Liver Function Tests: Recent Labs  Lab 07/19/22 0236 07/20/22 0302  ALBUMIN 2.9* 2.8*   No results for input(s): "LIPASE", "AMYLASE" in the last 168 hours. No results for input(s): "AMMONIA" in the last 168 hours.  Coagulation Profile: No results for input(s): "INR", "PROTIME" in the last 168 hours.  Cardiac Enzymes: No results for input(s): "CKTOTAL", "CKMB", "CKMBINDEX", "TROPONINI" in the last 168 hours.  BNP (last 3 results) No results for input(s): "PROBNP" in the last 8760 hours.  Lipid Profile: Recent Labs    07/19/22 0235  CHOL 145  HDL 31*  LDLCALC 83   TRIG 155*  CHOLHDL 4.7    Thyroid Function Tests: No results for input(s): "TSH", "T4TOTAL", "FREET4", "T3FREE", "THYROIDAB" in the last 72 hours.  Anemia Panel: No results for input(s): "VITAMINB12", "FOLATE", "FERRITIN", "TIBC", "IRON", "RETICCTPCT" in the last 72 hours.  Urine analysis:    Component Value Date/Time   COLORURINE YELLOW 11/07/2017 2340   APPEARANCEUR HAZY (A) 11/07/2017 2340   LABSPEC 1.017 11/07/2017 2340  PHURINE 5.0 11/07/2017 2340   GLUCOSEU 150 (A) 11/07/2017 2340   HGBUR NEGATIVE 11/07/2017 2340   BILIRUBINUR NEGATIVE 11/07/2017 2340   BILIRUBINUR neg 02/15/2017 0913   KETONESUR NEGATIVE 11/07/2017 2340   PROTEINUR >=300 (A) 11/07/2017 2340   UROBILINOGEN 0.2 02/15/2017 0913   NITRITE NEGATIVE 11/07/2017 2340   LEUKOCYTESUR NEGATIVE 11/07/2017 2340    Sepsis Labs: Lactic Acid, Venous    Component Value Date/Time   LATICACIDVEN 1.6 12/02/2020 1939    MICROBIOLOGY: No results found for this or any previous visit (from the past 240 hour(s)).  RADIOLOGY STUDIES/RESULTS: VAS [place] ABI WITH/WO TBI  Result Date: 07/19/2022  LOWER EXTREMITY DOPPLER STUDY Patient Name:  Kathryn Beck  Date of Exam:   07/19/2022 Medical Rec #: [id number]        Accession #:    [id number] Date of Birth: 06-27-1955        Patient Gender: F Patient Age:   22 years Exam Location:  [facility] Procedure:      VAS [place] ABI WITH/WO TBI Referring Phys: Heath Lark --------------------------------------------------------------------------------  Indications: Ulceration of left 1st and 2nd toes, and peripheral artery              disease. High Risk Factors: Hypertension, hyperlipidemia, Diabetes.  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 Comparison Study: Prior ABI done 04/25/22 Performing Technologist: Sherren Kerns RVS  Examination Guidelines: A complete evaluation includes at minimum, Doppler waveform signals and systolic blood pressure reading at the level of bilateral  brachial, anterior tibial, and posterior tibial arteries, when vessel segments are accessible. Bilateral testing is considered an integral part of a complete examination. Photoelectric Plethysmograph (PPG) waveforms and toe systolic pressure readings are included as required and additional duplex testing as needed. Limited examinations for reoccurring indications may be performed as noted.  ABI Findings: +---------+------------------+-----+---------+--------+ Right    Rt Pressure (mmHg)IndexWaveform Comment  +---------+------------------+-----+---------+--------+ Brachial 170                    triphasic         +---------+------------------+-----+---------+--------+ PTA                                      BKA      +---------+------------------+-----+---------+--------+ DP                                       BKA      +---------+------------------+-----+---------+--------+ Great Toe                                BKA      +---------+------------------+-----+---------+--------+ +---------+------------------+-----+----------+-------------------+ Left     Lt Pressure (mmHg)IndexWaveform  Comment             +---------+------------------+-----+----------+-------------------+ Brachial                                  restricted dialysis +---------+------------------+-----+----------+-------------------+ PTA      254               1.49 monophasic                    +---------+------------------+-----+----------+-------------------+ DP       98  0.58 monophasic                    +---------+------------------+-----+----------+-------------------+ Great Toe31                0.18                               +---------+------------------+-----+----------+-------------------+ +-------+-----------+-----------+------------+------------+ ABI/TBIToday's ABIToday's TBIPrevious ABIPrevious TBI  +-------+-----------+-----------+------------+------------+ Right  BKA        BKA        0.40        absent       +-------+-----------+-----------+------------+------------+ Left   1.49       0.18       0.42        absent       +-------+-----------+-----------+------------+------------+  Compared to prior study on 04/25/22.  Summary: Right: BKA. Left: Resting left ankle-brachial index indicates noncompressible left lower extremity arteries. The left toe-brachial index is abnormal. *See table(s) above for measurements and observations.  Electronically signed by Heath Lark on 07/19/2022 at 10:56:56 AM.    Final    DG Foot Complete Left  Result Date: 07/18/2022 CLINICAL DATA:  Wound to left second toe. 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 EXAM: LEFT FOOT - COMPLETE 3+ VIEW COMPARISON:  07/06/2021 FINDINGS: There is diffuse soft tissue edema. Extensive vascular calcifications. Chronic fracture deformities involving the head of the second, third, and fourth metatarsal bones are identified with increased surrounding callus formation. These are new when compared with the exam from 11/05/2021. Suspect healing fracture with increased callus formation and sclerosis involving the base of the fifth proximal phalanx. Hallux valgus deformity with degenerative changes at the first MTP joint identified. Focal bone loss with cortical irregularity involving the medial head of the first metatarsal bone is identified. This may be posttraumatic versus infectious in etiology. Progressive sclerosis is noted involving the lateral base of the first proximal phalanx. IMPRESSION: 1. Focal bone loss with cortical irregularity involving the medial head of the first metatarsal bone. This may be posttraumatic versus infectious in etiology. 2. Progressive sclerosis involving the lateral base of the first proximal phalanx. 3. Chronic, healing fracture deformities involving the head of the second, third,  and fourth metatarsal bones with increased surrounding callus formation. 4. Diffuse soft tissue edema. 5. Hallux valgus deformity with degenerative changes at the first MTP joint. Electronically Signed   By: Signa Kell M.D.   On: 07/18/2022 11:40     LOS: 2 days   Jeoffrey Massed, MD  [facility]    To contact the attending provider between 7A-7P or the covering provider during after hours 7P-7A, please log into the web site [email or web address] and access using universal [place] password for that web site. If you do not have the password, please call the hospital operator.  07/20/2022, 8:48 AM

## 2022-07-20 NOTE — Progress Notes (Signed)
   07/20/22 1300  Vitals  Temp 98 F (36.7 C)  Pulse Rate 70  Resp 17  BP (!) 176/75  SpO2 97 %  O2 Device Room Air  Post Treatment  Dialyzer Clearance Clear  Duration of HD Treatment -hour(s) 3.5 hour(s)  Hemodialysis Intake (mL) 0 mL  Liters Processed 84  Fluid Removed (mL) 2000 mL  Tolerated HD Treatment Yes  AVG/AVF Arterial Site Held (minutes) 8 minutes  AVG/AVF Venous Site Held (minutes) 8 minutes   Received patient in bed to unit.  Alert and oriented.  Informed consent signed and in chart.   TX duration:3.5hrs  Patient tolerated well.  Transported back to the room  Alert, without acute distress.  Hand-off given to patient's nurse.   Access used: LUA AVF Access issues: none  Total UF removed: 2L Medication(s) given: none    [facility]

## 2022-07-21 ENCOUNTER — Encounter (HOSPITAL_COMMUNITY)
Admission: EM | Disposition: A | Discharge status: Home Health | Payer: Self-pay | Source: Home / Self Care | Attending: Internal Medicine

## 2022-07-21 DIAGNOSIS — E782 Mixed hyperlipidemia: Secondary | ICD-10-CM | POA: Diagnosis not present

## 2022-07-21 DIAGNOSIS — I70245 Atherosclerosis of native arteries of left leg with ulceration of other part of foot: Secondary | ICD-10-CM

## 2022-07-21 DIAGNOSIS — I999 Unspecified disorder of circulatory system: Principal | ICD-10-CM

## 2022-07-21 DIAGNOSIS — N186 End stage renal disease: Secondary | ICD-10-CM | POA: Diagnosis not present

## 2022-07-21 DIAGNOSIS — Z992 Dependence on renal dialysis: Secondary | ICD-10-CM | POA: Diagnosis not present

## 2022-07-21 DIAGNOSIS — I739 Peripheral vascular disease, unspecified: Secondary | ICD-10-CM | POA: Diagnosis not present

## 2022-07-21 HISTORY — PX: PERIPHERAL VASCULAR INTERVENTION: CATH118257

## 2022-07-21 HISTORY — PX: ABDOMINAL AORTOGRAM W/LOWER EXTREMITY: CATH118223

## 2022-07-21 LAB — GLUCOSE, CAPILLARY
Glucose-Capillary: 143 mg/dL — ABNORMAL HIGH (ref 70–99)
Glucose-Capillary: 123 mg/dL — ABNORMAL HIGH (ref 70–99)
Glucose-Capillary: 121 mg/dL — ABNORMAL HIGH (ref 70–99)
Glucose-Capillary: 215 mg/dL — ABNORMAL HIGH (ref 70–99)

## 2022-07-21 LAB — IRON AND TIBC
Iron: 32 ug/dL (ref 28–170)
Saturation Ratios: 21 % (ref 10.4–31.8)
TIBC: 154 ug/dL — ABNORMAL LOW (ref 250–450)
UIBC: 122 ug/dL

## 2022-07-21 LAB — POCT ACTIVATED CLOTTING TIME: Activated Clotting Time: 266 seconds

## 2022-07-21 LAB — FERRITIN: Ferritin: 799 ng/mL — ABNORMAL HIGH (ref 11–307)

## 2022-07-21 LAB — HEPATITIS B SURFACE ANTIBODY, QUANTITATIVE: Hep B S AB Quant (Post): 774 m[IU]/mL (ref >9.9)

## 2022-07-21 SURGERY — ABDOMINAL AORTOGRAM W/LOWER EXTREMITY
Anesthesia: LOCAL

## 2022-07-21 MED ORDER — CLOPIDOGREL BISULFATE 75 MG PO TABS: 75.0000 mg | ORAL_TABLET | Freq: Every day | ORAL | Status: DC

## 2022-07-21 MED ORDER — LIDOCAINE HCL (PF) 1 % IJ SOLN
INTRAMUSCULAR | Status: DC | PRN
  Administered 2022-07-21: 15 mL

## 2022-07-21 MED ORDER — LABETALOL HCL 5 MG/ML IV SOLN: 10.0000 mg | INTRAVENOUS | Status: DC | PRN

## 2022-07-21 MED ORDER — MIDAZOLAM HCL 2 MG/2ML IJ SOLN
INTRAMUSCULAR | Status: AC
  Filled 2022-07-21: qty 2

## 2022-07-21 MED ORDER — SODIUM CHLORIDE 0.9% FLUSH: 3.0000 mL | Freq: Two times a day (BID) | INTRAVENOUS | Status: DC

## 2022-07-21 MED ORDER — HEPARIN SODIUM (PORCINE) 1000 UNIT/ML IJ SOLN
INTRAMUSCULAR | Status: DC | PRN
  Administered 2022-07-21: 8000 [IU] via INTRAVENOUS
  Administered 2022-07-21: 1000 [IU] via INTRAVENOUS

## 2022-07-21 MED ORDER — IODIXANOL 320 MG/ML IV SOLN
INTRAVENOUS | Status: DC | PRN
  Administered 2022-07-21: 135 mL

## 2022-07-21 MED ORDER — SODIUM CHLORIDE 0.9% FLUSH: 3.0000 mL | INTRAVENOUS | Status: DC | PRN

## 2022-07-21 MED ORDER — SODIUM CHLORIDE 0.9 % IV SOLN: 250.0000 mL | INTRAVENOUS | Status: DC | PRN

## 2022-07-21 MED ORDER — ACETAMINOPHEN 325 MG PO TABS: 650.0000 mg | ORAL_TABLET | ORAL | Status: DC | PRN

## 2022-07-21 MED ORDER — LIDOCAINE HCL (PF) 1 % IJ SOLN
INTRAMUSCULAR | Status: AC
  Filled 2022-07-21: qty 30

## 2022-07-21 MED ORDER — ONDANSETRON HCL 4 MG/2ML IJ SOLN: 4.0000 mg | Freq: Four times a day (QID) | INTRAMUSCULAR | Status: DC | PRN

## 2022-07-21 MED ORDER — MIDAZOLAM HCL 2 MG/2ML IJ SOLN
INTRAMUSCULAR | Status: DC | PRN
  Administered 2022-07-21 (×3): 1 mg via INTRAVENOUS

## 2022-07-21 MED ORDER — HEPARIN (PORCINE) IN NACL 1000-0.9 UT/500ML-% IV SOLN
INTRAVENOUS | Status: DC | PRN
  Administered 2022-07-21 (×2): 500 mL

## 2022-07-21 MED ORDER — FENTANYL CITRATE (PF) 100 MCG/2ML IJ SOLN
INTRAMUSCULAR | Status: DC | PRN
  Administered 2022-07-21: 25 ug via INTRAVENOUS
  Administered 2022-07-21: 50 ug via INTRAVENOUS

## 2022-07-21 MED ORDER — FENTANYL CITRATE (PF) 100 MCG/2ML IJ SOLN
INTRAMUSCULAR | Status: AC
  Filled 2022-07-21: qty 2

## 2022-07-21 MED ORDER — CHLORHEXIDINE GLUCONATE CLOTH 2 % EX PADS: 6.0000 | MEDICATED_PAD | Freq: Every day | CUTANEOUS | Status: DC

## 2022-07-21 MED ORDER — HYDRALAZINE HCL 20 MG/ML IJ SOLN: 5.0000 mg | INTRAMUSCULAR | Status: DC | PRN

## 2022-07-21 MED ORDER — HYDROMORPHONE HCL 1 MG/ML IJ SOLN
0.5000 mg | INTRAMUSCULAR | Status: AC
  Administered 2022-07-21: 0.5 mg via INTRAVENOUS
  Filled 2022-07-21: qty 0.5

## 2022-07-21 MED ORDER — ASPIRIN 81 MG PO TBEC
81.0000 mg | DELAYED_RELEASE_TABLET | Freq: Every day | ORAL | Status: DC
  Administered 2022-07-22: 81 mg via ORAL
  Filled 2022-07-21: qty 1

## 2022-07-21 SURGICAL SUPPLY — 29 items
BALLN COYOTE OTW 3X60X150 (BALLOONS) ×2
BALLN MUSTANG 6X150X135 (BALLOONS) ×2
BALLOON COYOTE OTW 3X60X150 (BALLOONS) IMPLANT
BALLOON MUSTANG 6X150X135 (BALLOONS) IMPLANT
CATH OMNI FLUSH 5F 65CM (CATHETERS) IMPLANT
CATH QUICKCROSS SUPP .035X90CM (MICROCATHETER) IMPLANT
CATH SHOCKWAVE M5 4.5X60 (CATHETERS) IMPLANT
CATH SHOCKWAVE M5 5.0X60 (CATHETERS) IMPLANT
DEVICE TORQUE .025-.038 (MISCELLANEOUS) IMPLANT
DEVICE VASC CLSR CELT ART 6 (Vascular Products) IMPLANT
GLIDEWIRE ADV .035X260CM (WIRE) IMPLANT
GUIDEWIRE ANGLED .035X150CM (WIRE) IMPLANT
KIT ENCORE 26 ADVANTAGE (KITS) IMPLANT
KIT MICROPUNCTURE NIT STIFF (SHEATH) IMPLANT
KIT PV (KITS) ×2 IMPLANT
SHEATH PINNACLE 5F 10CM (SHEATH) IMPLANT
SHEATH PINNACLE 6F 10CM (SHEATH) IMPLANT
SHEATH PINNACLE ST 6F 45CM (SHEATH) IMPLANT
SHEATH PROBE COVER 6X72 (BAG) IMPLANT
STENT ELUVIA 7X150X130 (Permanent Stent) IMPLANT
STOPCOCK MORSE 400PSI 3WAY (MISCELLANEOUS) IMPLANT
SYR MEDRAD MARK 7 150ML (SYRINGE) ×2 IMPLANT
TRANSDUCER W/STOPCOCK (MISCELLANEOUS) ×2 IMPLANT
TRAY PV CATH (CUSTOM PROCEDURE TRAY) ×2 IMPLANT
TUBING CIL FLEX 10 FLL-RA (TUBING) IMPLANT
WIRE SHEPHERD 12G .014 (WIRE) IMPLANT
WIRE SPARTACORE .014X300CM (WIRE) IMPLANT
WIRE STARTER BENTSON 035X150 (WIRE) IMPLANT
WIRE TORQFLEX AUST .018X40CM (WIRE) IMPLANT

## 2022-07-21 NOTE — Care Management Important Message (Signed)
Important Message  Patient Details  Name: Kathryn Beck MRN: [id number] Date of Birth: November 02, 1955   Medicare Important Message Given:  Yes     [name] 07/21/2022, 3:10 PM

## 2022-07-21 NOTE — Op Note (Signed)
Patient name: BRANTLEIGH MIFFLIN MRN: [id number] DOB: [birth year] Sex: female  07/21/2022 Pre-operative Diagnosis: Left toe ulcer Post-operative diagnosis:  Same Surgeon:  Durene Cal Procedure Performed:  1.  Ultrasound-guided access, right femoral artery  2.  Abdominal aortogram with bifemoral runoff  3.  Catheter selection, left superficial femoral artery and left popliteal artery  4.  Lithotripsy, left superficial femoral artery and popliteal artery  5.  Stent, left superficial femoral and popliteal artery  6.  Conscious sedation, 100 minutes  7.  Closure device, Celt   Indications: This is a 67 year old female with end-stage renal disease and history of right leg amputation who has developed a new wound on her left foot.  She comes in today for further evaluation.  Procedure:  The patient was identified in the holding area and taken to room 8.  The patient was then placed supine on the table and prepped and draped in the usual sterile fashion.  A time out was called.  Conscious sedation was administered with the use of IV fentanyl and Versed under continuous physician and nurse monitoring.  Heart rate, blood pressure, and oxygen saturation were continuously monitored.  Total sedation time was 100 minutes.  Ultrasound was used to evaluate the right common femoral artery.  It was patent .  A digital ultrasound image was acquired.  A micropuncture needle was used to access the right common femoral artery under ultrasound guidance.  An 018 wire was advanced without resistance and a micropuncture sheath was placed.  The 018 wire was removed and a benson wire was placed.  The micropuncture sheath was exchanged for a 5 french sheath.  An omniflush catheter was advanced over the wire to the level of L-1.  An abdominal angiogram was obtained.  Next, using the omniflush catheter and a benson wire, the aortic bifurcation was crossed and the catheter was placed into theleft external iliac artery and left  runoff was obtained.  Findings:   Aortogram: No evidence of renal artery stenosis.  The infrarenal abdominal aorta is widely patent.  Bilateral common and external iliac arteries are widely patent.  Right Lower Extremity: The visualized portions of the right common femoral artery and its distal branches are patent without significant stenosis  Left Lower Extremity: The left common femoral artery is widely patent but heavily calcified as is the profundofemoral artery.  The superficial femoral artery is heavily calcified.  In the midportion there are 2 focal areas of stenoses greater than 50% with heavy calcification.  The artery occludes at the adductor canal.  There is reconstitution of the popliteal artery above the knee.  There is diffuse disease with greater than 50% stenosis within the below-knee popliteal artery.  There is single-vessel runoff via the peroneal artery.  Intervention: After the above images were acquired the decision was made to proceed with intervention.  A 6 French 45 cm sheath was advanced into the left superficial femoral artery.  The patient was fully heparinized.  Additional images were performed with the catheter in the superficial femoral artery to better define the disease.  Next using an 035 Glidewire and quick cross catheter, the total occlusion was crossed.  The catheter was then placed in the popliteal artery and additional lower extremity imaging were obtained.  This proved that I was true lumen and also identified more clearly the popliteal disease.  I then advanced a [place] core wire into the peroneal artery.  I used a 3 x 60 coyote balloon to predilate the  total occlusion and then inserted a 5 x 60 shockwave lithotripsy balloon.  Arterial lithotripsy was performed in the popliteal and superficial femoral artery at 6 atm.  I then exchanged out for a heavier wire.  A 12 g wire was then placed into the peroneal artery.  A 4.5 x 60 shockwave balloon was then used to treat the  below-knee popliteal artery disease.  I elected to stent the superficial femoral artery because of the heavily calcified total occlusion.  I placed a 7 x 150 Eluvia stent and postdilated it with a 6 mm balloon.  Follow-up imaging showed excellent result however there was 2 more proximal lesions that I felt needed to be stented and so an additional 7 x 150 Eluvia stent was deployed and postdilated with a 6 mm balloon.  Completion imaging showed inline flow through the superficial femoral, popliteal, and peroneal artery which collateralizes out onto the foot.  The groin was then closed with a Celt  Impression:  #1  Total occlusion of the distal left superficial femoral and left proximal above-knee popliteal artery successfully crossed.  This was heavily calcified so it was initially treated with shockwave intra-arterial lithotripsy followed by stenting.  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  She needs to continue on Plavix and aspirin.  She has a statin allergy.    Juleen China, M.D., [facility] [place] Office: [phone] Pager:  [phone]

## 2022-07-21 NOTE — Progress Notes (Signed)
[facility] Progress Note   Subjective:   Feeling well, waiting for vascular procedure today. Denies SOB, CP, dizziness and nausea.   Objective Vitals:   07/21/22 0400 07/21/22 0726 07/21/22 1136 07/21/22 1137  BP: (!) 155/95 (!) 139/52  (!) 158/82  Pulse:  70    Resp:      Temp: 98.1 F (36.7 C)  98.2 F (36.8 C) 98.2 F (36.8 C)  TempSrc: Oral Oral Oral Oral  SpO2: 92%  93% 90%  Weight:      Height:       Physical Exam General: WDWN female, alert and in NAD Heart: RRR, no murmurs, rubs or gallops Lungs: CTA anteriorly, respirations unlabored on RA Abdomen: Soft, non-distended, +BS Extremities: R BKA, no significant edema Dialysis Access: LUE AVF accessed  Additional Objective Labs: Basic Metabolic Panel: Recent Labs  Lab 07/18/22 1135 07/19/22 0235 07/19/22 0236 07/20/22 0302  NA 139  --  141 142  K 4.3  --  3.7 3.5  CL 102  --  104 105  CO2 27  --  26 26  GLUCOSE 179*  --  235* 200*  BUN 14  --  27* 39*  CREATININE 4.61*  --  5.96* 7.60*  CALCIUM 9.1  --  9.2 9.1  PHOS  --  3.9 3.9 4.7*   Liver Function Tests: Recent Labs  Lab 07/19/22 0236 07/20/22 0302  ALBUMIN 2.9* 2.8*   No results for input(s): "LIPASE", "AMYLASE" in the last 168 hours. CBC: Recent Labs  Lab 07/18/22 1135 07/19/22 0235 07/20/22 0302  WBC 9.9 7.1 7.4  NEUTROABS 7.4  --   --   HGB 9.2* 8.9* 8.7*  HCT 30.4* 28.5* 28.9*  MCV 82.6 81.7 83.0  PLT 333 295 318   Blood Culture    Component Value Date/Time   SDES  11/07/2017 2340    URINE, CLEAN CATCH Performed at [facility], [address], [place], [place] [address]    [facility]  11/07/2017 2340    NONE Performed at [facility], [address] [place], [place] [address]    CULT (A) 11/07/2017 2340    <10,000 COLONIES/mL INSIGNIFICANT GROWTH Performed at [facility], [address], [place], [place] [address]    REPTSTATUS 11/09/2017 FINAL 11/07/2017 2340    Cardiac Enzymes: No results  for input(s): "CKTOTAL", "CKMB", "CKMBINDEX", "TROPONINI" in the last 168 hours. CBG: Recent Labs  Lab 07/20/22 1430 07/20/22 1619 07/20/22 2121 07/21/22 0723 07/21/22 1136  GLUCAP 144* 146* 113* 143* 123*   Iron Studies:  Recent Labs    07/21/22 0604  IRON 32  TIBC 154*  FERRITIN 799*   @lablastinr3 @ Studies/Results: No results found. Medications:  sodium chloride      aspirin  162 mg Oral Daily   calcitRIOL  0.25 mcg Oral BID WC   Chlorhexidine Gluconate Cloth  6 each Topical Q0600   clopidogrel  75 mg Oral Q breakfast   DULoxetine  60 mg Oral Daily   ezetimibe  10 mg Oral Daily   heparin  5,000 Units Subcutaneous Q8H   insulin aspart  0-5 Units Subcutaneous QHS   insulin aspart  0-6 Units Subcutaneous TID WC   insulin glargine-yfgn  26 Units Subcutaneous QHS   ketotifen  1 drop Both Eyes BID   metoprolol succinate  50 mg Oral QPM   multivitamin  1 tablet Oral Daily   pregabalin  50 mg Oral TID   sertraline  50 mg Oral Daily  sodium chloride flush  3 mL Intravenous Q12H   sucroferric oxyhydroxide  1,000 mg Oral TID WC   And   sucroferric oxyhydroxide  500 mg Oral With snacks    Dialysis Orders: Per last admission  [facility] MWF 3h   84kg  400/500   LUA AVF  15ga  Heparin 1000 units initial bolus then 1000u/hr, stop 1 hour before end of treatment (3200 units IV here).   Assessment/Plan:  L foot ulcer w/known PAD- VVS consulted.  Plan for LLE angiogram with possible intervention on 4/30.   ESRD -  on MWF.  HD tomorrow per regular schedule  Hypertension/volume  - Blood pressure slightly elevated. On Toprol 50mg  qd.  Does not appear volume overloaded.  UF as tolerated.   Anemia of CKD - Hgb 8.7 - checking iron studies. Follow trends, may need ESA.  Secondary Hyperparathyroidism -  Calcium and phos within goal. Not on VDRA. Continue phosphorus binders  Nutrition - Renal diet w/fluid restrictions DMT2 - per PMD    [name] Blocker,  PA-C 07/21/2022, 11:55 AM  [facility] Pager: [phone]

## 2022-07-21 NOTE — Progress Notes (Addendum)
PROGRESS NOTE        PATIENT DETAILS Name: Kathryn Beck Age: 67 y.o. Sex: female Date of Birth: 04-Mar-1956 Admit Date: 07/18/2022 Admitting Physician Vassie Loll, MD [id number], Milus Mallick, MD  Brief Summary: Patient is a 67 y.o.  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  Initially seen at [facility]-subsequently transferred to [facility] for vascular surgery evaluation.  Significant events: 4/27>> admit to [facility]  Significant studies: 4/27>> x-ray left foot: Focal bone loss involving the first metatarsal bone-posttraumatic versus infectious  Significant microbiology data: None  Procedures: None  Consults: Vascular surgery  Subjective: No major issues-lying comfortably in bed.  Objective: Vitals: Blood pressure (!) 158/82, pulse 70, temperature 98.2 F (36.8 C), temperature source Oral, resp. rate 16, height 5\' 4"  (1.626 m), weight 83.7 kg, SpO2 90 %.   Exam: Gen Exam:Alert awake-not in any distress HEENT:atraumatic, normocephalic Chest: B/L clear to auscultation anteriorly CVS:S1S2 regular Abdomen:soft non tender, non distended Extremities:no edema-unchanged great toe/second toe-as picture below. Neurology: Non focal Skin: no rash    Pertinent Labs/Radiology:    Latest Ref Rng & Units 07/20/2022    3:02 AM 07/19/2022    2:35 AM 07/18/2022   11:35 AM  CBC  WBC 4.0 - 10.5 K/uL 7.4  7.1  9.9   Hemoglobin 12.0 - 15.0 g/dL 8.7  8.9  9.2   Hematocrit 36.0 - 46.0 % 28.9  28.5  30.4   Platelets 150 - 400 K/uL 318  295  333     Lab Results  Component Value Date   NA 142 07/20/2022   K 3.5 07/20/2022   CL 105 07/20/2022   CO2 26 07/20/2022    Assessment/Plan: Critical left lower extremity ischemia-with great toe and second toe ulceration  Continue antiplatelets/Zetia-apparently intolerant to  statin due to LFT elevation Vascular surgery planning angiogram today.  PAD-s/p left BKA by Dr Lajoyce Corners 05/29/22 BKA stump intact.  ESRD on HD MWF Nephrology following for HD care  Normocytic anemia Secondary to ESRD Aranesp/ iron per nephrology.  DM-2 (A1c 6.3 on 4/16) CBG stable-borderline hypoglycemia-decrease Lantus to 26 units, continue SSI.   Recent Labs    07/20/22 2121 07/21/22 0723 07/21/22 1136  GLUCAP 113* 143* 123*     HTN BP stable Continue metoprolol  HLD Zetia  Peripheral neuropathy Likely related to diabetes Lyrica  BMI: Estimated body mass index is 31.67 kg/m as calculated from the following:   Height as of this encounter: 5\' 4"  (1.626 m).   Weight as of this encounter: 83.7 kg.   Code status:   Code Status: Full Code   DVT Prophylaxis: heparin injection 5,000 Units Start: 07/18/22 2200   Family Communication: None at bedside   Disposition Plan: Status is: Inpatient Remains inpatient appropriate because: Severity of illness   Planned Discharge Destination:Home health   Diet: Diet Order             Diet NPO time specified Except for: Sips with Meds  Diet effective midnight                     Antimicrobial agents: Anti-infectives (From admission, onward)    None        MEDICATIONS: Scheduled Meds:  aspirin  162 mg Oral Daily   calcitRIOL  0.25 mcg Oral BID WC   Chlorhexidine Gluconate Cloth  6 each Topical Q0600   [START ON 07/22/2022] Chlorhexidine Gluconate Cloth  6 each Topical Q0600   clopidogrel  75 mg Oral Q breakfast   DULoxetine  60 mg Oral Daily   ezetimibe  10 mg Oral Daily   heparin  5,000 Units Subcutaneous Q8H   insulin aspart  0-5 Units Subcutaneous QHS   insulin aspart  0-6 Units Subcutaneous TID WC   insulin glargine-yfgn  26 Units Subcutaneous QHS   ketotifen  1 drop Both Eyes BID   metoprolol succinate  50 mg Oral QPM   multivitamin  1 tablet Oral Daily   pregabalin  50 mg Oral TID   sertraline   50 mg Oral Daily   sodium chloride flush  3 mL Intravenous Q12H   sucroferric oxyhydroxide  1,000 mg Oral TID WC   And   sucroferric oxyhydroxide  500 mg Oral With snacks   Continuous Infusions:  sodium chloride     PRN Meds:.sodium chloride, acetaminophen **OR** acetaminophen, hydrOXYzine, ondansetron **OR** ondansetron (ZOFRAN) IV, sodium chloride flush   I have personally reviewed following labs and imaging studies  LABORATORY DATA: CBC: Recent Labs  Lab 07/18/22 1135 07/19/22 0235 07/20/22 0302  WBC 9.9 7.1 7.4  NEUTROABS 7.4  --   --   HGB 9.2* 8.9* 8.7*  HCT 30.4* 28.5* 28.9*  MCV 82.6 81.7 83.0  PLT 333 295 318     Basic Metabolic Panel: Recent Labs  Lab 07/18/22 1135 07/19/22 0235 07/19/22 0236 07/20/22 0302  NA 139  --  141 142  K 4.3  --  3.7 3.5  CL 102  --  104 105  CO2 27  --  26 26  GLUCOSE 179*  --  235* 200*  BUN 14  --  27* 39*  CREATININE 4.61*  --  5.96* 7.60*  CALCIUM 9.1  --  9.2 9.1  MG  --  2.0  --   --   PHOS  --  3.9 3.9 4.7*     GFR: Estimated Creatinine Clearance: 7.6 mL/min (A) (by C-G formula based on SCr of 7.6 mg/dL (H)).  Liver Function Tests: Recent Labs  Lab 07/19/22 0236 07/20/22 0302  ALBUMIN 2.9* 2.8*    No results for input(s): "LIPASE", "AMYLASE" in the last 168 hours. No results for input(s): "AMMONIA" in the last 168 hours.  Coagulation Profile: No results for input(s): "INR", "PROTIME" in the last 168 hours.  Cardiac Enzymes: No results for input(s): "CKTOTAL", "CKMB", "CKMBINDEX", "TROPONINI" in the last 168 hours.  BNP (last 3 results) No results for input(s): "PROBNP" in the last 8760 hours.  Lipid Profile: Recent Labs    07/19/22 0235  CHOL 145  HDL 31*  LDLCALC 83  TRIG 409*  CHOLHDL 4.7     Thyroid Function Tests: No results for input(s): "TSH", "T4TOTAL", "FREET4", "T3FREE", "THYROIDAB" in the last 72 hours.  Anemia Panel: Recent Labs    07/21/22 0604  FERRITIN 799*  TIBC  154*  IRON 32    Urine analysis:    Component Value Date/Time   COLORURINE YELLOW 11/07/2017 2340   APPEARANCEUR HAZY (A) 11/07/2017 2340   LABSPEC 1.017 11/07/2017 2340   PHURINE 5.0 11/07/2017 2340   GLUCOSEU 150 (A) 11/07/2017 2340   HGBUR NEGATIVE 11/07/2017 2340   BILIRUBINUR NEGATIVE 11/07/2017 2340   BILIRUBINUR neg 02/15/2017 0913   KETONESUR NEGATIVE  11/07/2017 2340   PROTEINUR >=300 (A) 11/07/2017 2340   UROBILINOGEN 0.2 02/15/2017 0913   NITRITE NEGATIVE 11/07/2017 2340   LEUKOCYTESUR NEGATIVE 11/07/2017 2340    Sepsis Labs: Lactic Acid, Venous    Component Value Date/Time   LATICACIDVEN 1.6 12/02/2020 1939    MICROBIOLOGY: No results found for this or any previous visit (from the past 240 hour(s)).  RADIOLOGY STUDIES/RESULTS: No results found.   LOS: 3 days   Jeoffrey Massed, MD  [facility]    To contact the attending provider between 7A-7P or the covering provider during after hours 7P-7A, please log into the web site [email or web address] and access using universal [place] password for that web site. If you do not have the password, please call the hospital operator.  07/21/2022, 12:01 PM

## 2022-07-22 ENCOUNTER — Encounter (HOSPITAL_COMMUNITY): Payer: Self-pay | Admitting: Surgery

## 2022-07-22 DIAGNOSIS — E1122 Type 2 diabetes mellitus with diabetic chronic kidney disease: Secondary | ICD-10-CM | POA: Diagnosis not present

## 2022-07-22 DIAGNOSIS — Z4781 Encounter for orthopedic aftercare following surgical amputation: Secondary | ICD-10-CM | POA: Diagnosis not present

## 2022-07-22 DIAGNOSIS — D631 Anemia in chronic kidney disease: Secondary | ICD-10-CM

## 2022-07-22 DIAGNOSIS — Z89511 Acquired absence of right leg below knee: Secondary | ICD-10-CM | POA: Diagnosis not present

## 2022-07-22 DIAGNOSIS — E1151 Type 2 diabetes mellitus with diabetic peripheral angiopathy without gangrene: Secondary | ICD-10-CM | POA: Diagnosis not present

## 2022-07-22 DIAGNOSIS — I739 Peripheral vascular disease, unspecified: Secondary | ICD-10-CM | POA: Diagnosis not present

## 2022-07-22 DIAGNOSIS — I70221 Atherosclerosis of native arteries of extremities with rest pain, right leg: Secondary | ICD-10-CM | POA: Diagnosis not present

## 2022-07-22 DIAGNOSIS — E1140 Type 2 diabetes mellitus with diabetic neuropathy, unspecified: Secondary | ICD-10-CM

## 2022-07-22 DIAGNOSIS — I5032 Chronic diastolic (congestive) heart failure: Secondary | ICD-10-CM | POA: Diagnosis not present

## 2022-07-22 DIAGNOSIS — I132 Hypertensive heart and chronic kidney disease with heart failure and with stage 5 chronic kidney disease, or end stage renal disease: Secondary | ICD-10-CM | POA: Diagnosis not present

## 2022-07-22 DIAGNOSIS — Z7984 Long term (current) use of oral hypoglycemic drugs: Secondary | ICD-10-CM

## 2022-07-22 DIAGNOSIS — N186 End stage renal disease: Secondary | ICD-10-CM | POA: Diagnosis not present

## 2022-07-22 DIAGNOSIS — S90935D Unspecified superficial injury of left lesser toe(s), subsequent encounter: Secondary | ICD-10-CM | POA: Diagnosis not present

## 2022-07-22 LAB — LIPID PANEL
Cholesterol: 156 mg/dL (ref 0–200)
HDL: 26 mg/dL — ABNORMAL LOW (ref >40)
LDL Cholesterol: 70 mg/dL (ref 0–99)
Total CHOL/HDL Ratio: 6 RATIO
Triglycerides: 301 mg/dL — ABNORMAL HIGH (ref <150)
VLDL: 60 mg/dL — ABNORMAL HIGH (ref 0–40)

## 2022-07-22 LAB — GLUCOSE, CAPILLARY: Glucose-Capillary: 226 mg/dL — ABNORMAL HIGH (ref 70–99)

## 2022-07-22 MED ORDER — TOUJEO MAX SOLOSTAR 300 UNIT/ML ~~LOC~~ SOPN: 30.0000 [IU] | PEN_INJECTOR | Freq: Every day | SUBCUTANEOUS | 0 refills | Status: DC

## 2022-07-22 NOTE — Progress Notes (Addendum)
[facility] [place]  Subjective  - No new complaints   Objective (!) 152/57 78 98.8 F (37.1 C) (Oral) 16 96% No intake or output data in the 24 hours ending 07/22/22 0735  Left LE with non healing second toe wound-dry Doppler signals peroneal artery Left groin soft without hematoma  Assessment/Planning: POD # 1 angiogram with Lithotripsy, left superficial femoral artery and popliteal artery  Doppler signal Peroneal artery brisk We will observe the second toe Surgical impression:  The patient has been maximally revascularized at this time.  She needs to continue on Plavix and aspirin.  She has a statin allergy.  F/U will be arranged with left LE arterial duplex and ABI.  She has an amputation on the right LE that is well healed.   Mosetta Pigeon 07/22/2022 7:35 AM --  Laboratory Lab Results: Recent Labs    07/20/22 0302  WBC 7.4  HGB 8.7*  HCT 28.9*  PLT 318   BMET Recent Labs    07/20/22 0302  NA 142  K 3.5  CL 105  CO2 26  GLUCOSE 200*  BUN 39*  CREATININE 7.60*  CALCIUM 9.1    COAG Lab Results  Component Value Date   INR 1.0 03/03/2019   INR 1.17 09/02/2017   No results found for: "PTT"  I agree with the above.  POD#1, s/p left leg arterial lithotripsy and stenting.  Will need ASA and Plavix.  Will allow toes to demarcate  [employer]

## 2022-07-22 NOTE — Plan of Care (Signed)
  Problem: Education: Goal: Knowledge of General Education information will improve Description: Including pain rating scale, medication(s)/side effects and non-pharmacologic comfort measures Outcome: Progressing   Problem: Health Behavior/Discharge Planning: Goal: Ability to manage health-related needs will improve Outcome: Progressing   Problem: Clinical Measurements: Goal: Ability to maintain clinical measurements within normal limits will improve Outcome: Progressing Goal: Will remain free from infection Outcome: Progressing Goal: Diagnostic test results will improve Outcome: Progressing Goal: Respiratory complications will improve Outcome: Progressing Goal: Cardiovascular complication will be avoided Outcome: Progressing   Problem: Activity: Goal: Risk for activity intolerance will decrease Outcome: Progressing   Problem: Nutrition: Goal: Adequate nutrition will be maintained Outcome: Progressing   Problem: Coping: Goal: Level of anxiety will decrease Outcome: Progressing   Problem: Elimination: Goal: Will not experience complications related to bowel motility Outcome: Progressing Goal: Will not experience complications related to urinary retention Outcome: Progressing   Problem: Pain Managment: Goal: General experience of comfort will improve Outcome: Progressing   Problem: Safety: Goal: Ability to remain free from injury will improve Outcome: Progressing   Problem: Skin Integrity: Goal: Risk for impaired skin integrity will decrease Outcome: Progressing   Problem: Education: Goal: Ability to describe self-care measures that may prevent or decrease complications (Diabetes Survival Skills Education) will improve Outcome: Progressing Goal: Individualized Educational Video(s) Outcome: Progressing   Problem: Coping: Goal: Ability to adjust to condition or change in health will improve Outcome: Progressing   Problem: Fluid Volume: Goal: Ability to  maintain a balanced intake and output will improve Outcome: Progressing   Problem: Health Behavior/Discharge Planning: Goal: Ability to identify and utilize available resources and services will improve Outcome: Progressing Goal: Ability to manage health-related needs will improve Outcome: Progressing   Problem: Metabolic: Goal: Ability to maintain appropriate glucose levels will improve Outcome: Progressing   Problem: Nutritional: Goal: Maintenance of adequate nutrition will improve Outcome: Progressing Goal: Progress toward achieving an optimal weight will improve Outcome: Progressing   Problem: Skin Integrity: Goal: Risk for impaired skin integrity will decrease Outcome: Progressing   Problem: Tissue Perfusion: Goal: Adequacy of tissue perfusion will improve Outcome: Progressing   Problem: Education: Goal: Understanding of CV disease, CV risk reduction, and recovery process will improve Outcome: Progressing Goal: Individualized Educational Video(s) Outcome: Progressing   Problem: Activity: Goal: Ability to return to baseline activity level will improve Outcome: Progressing   Problem: Cardiovascular: Goal: Ability to achieve and maintain adequate cardiovascular perfusion will improve Outcome: Progressing Goal: Vascular access site(s) Level 0-1 will be maintained Outcome: Progressing   Problem: Health Behavior/Discharge Planning: Goal: Ability to safely manage health-related needs after discharge will improve Outcome: Progressing   

## 2022-07-22 NOTE — Consult Note (Signed)
   [facility] Inpatient Consult   07/22/2022  Kathryn Beck [birth year] [id number]  [facility] [[facility]]  Accountable Care Organization [ACO] Patient: Kathryn Beck PPO  Primary Care Provider:  [name], MD   Patient is currently active with [facility] [[facility]] Care Management for chronic disease management services.  Patient has been engaged by a [facility] LCSW.  Our community based plan of care has focused on disease management and community resource support.    Patient will receive a post hospital call and will be evaluated for assessments and disease process education.    Plan:  Follow up with [facility] LCSW for Aide and Attendant noted, patient has new HD [facility] to start there Friday.   Of note, [facility] Care Management services does not replace or interfere with any services that are needed or arranged by inpatient [facility] care management team.   For additional questions or referrals please contact:  Charlesetta Shanks, RN BSN CCM [facility]  [phone] business mobile phone Toll free office [phone]  *Concierge Line  [phone] Fax number: [phone] [email or web address]

## 2022-07-22 NOTE — Progress Notes (Signed)
[facility] Progress Note   Subjective:   Seen on HD. Feeling well, no new concerns. Denies SOB, CP, dizziness, nausea.   Objective Vitals:   07/22/22 0547 07/22/22 0736 07/22/22 0800 07/22/22 0808  BP: (!) 152/57 (!) 143/52 (!) 143/52 (!) 156/58  Pulse:  72 73 68  Resp:  16 14 19   Temp:  97.9 F (36.6 C) 98.1 F (36.7 C)   TempSrc:  Oral    SpO2:  96% 98% 100%  Weight:   83.7 kg 83.7 kg  Height:       Physical Exam General: WDWN female, alert and in NAD Heart: RRR, no murmurs, rubs or gallops Lungs: CTA anteriorly, respirations unlabored on RA Abdomen: Soft, non-distended, +BS Extremities: R BKA, no significant edema Dialysis Access: LUE AVF accessed  Additional Objective Labs: Basic Metabolic Panel: Recent Labs  Lab 07/18/22 1135 07/19/22 0235 07/19/22 0236 07/20/22 0302  NA 139  --  141 142  K 4.3  --  3.7 3.5  CL 102  --  104 105  CO2 27  --  26 26  GLUCOSE 179*  --  235* 200*  BUN 14  --  27* 39*  CREATININE 4.61*  --  5.96* 7.60*  CALCIUM 9.1  --  9.2 9.1  PHOS  --  3.9 3.9 4.7*   Liver Function Tests: Recent Labs  Lab 07/19/22 0236 07/20/22 0302  ALBUMIN 2.9* 2.8*   No results for input(s): "LIPASE", "AMYLASE" in the last 168 hours. CBC: Recent Labs  Lab 07/18/22 1135 07/19/22 0235 07/20/22 0302  WBC 9.9 7.1 7.4  NEUTROABS 7.4  --   --   HGB 9.2* 8.9* 8.7*  HCT 30.4* 28.5* 28.9*  MCV 82.6 81.7 83.0  PLT 333 295 318   Blood Culture    Component Value Date/Time   SDES  11/07/2017 2340    URINE, CLEAN CATCH Performed at [facility], [address] [place], [place] [address]    [facility]  11/07/2017 2340    NONE Performed at [facility], [address] [place], [place] [address]    CULT (A) 11/07/2017 2340    <10,000 COLONIES/mL INSIGNIFICANT GROWTH Performed at [facility], [address] [place], [place] [address]    REPTSTATUS 11/09/2017 FINAL 11/07/2017 2340    Cardiac Enzymes: No results for  input(s): "CKTOTAL", "CKMB", "CKMBINDEX", "TROPONINI" in the last 168 hours. CBG: Recent Labs  Lab 07/21/22 0723 07/21/22 1136 07/21/22 1729 07/21/22 2108 07/22/22 0734  GLUCAP 143* 123* 121* 215* 226*   Iron Studies:  Recent Labs    07/21/22 0604  IRON 32  TIBC 154*  FERRITIN 799*   @lablastinr3 @ Studies/Results: PERIPHERAL VASCULAR CATHETERIZATION  Result Date: 07/21/2022 Images from the original result were not included. Patient name: Kathryn Beck MRN: [id number] DOB: 1955-08-22 Sex: female 07/21/2022 Pre-operative Diagnosis: Left toe ulcer Post-operative diagnosis:  Same Surgeon:  Durene Cal Procedure Performed:  1.  Ultrasound-guided access, right femoral artery  2.  Abdominal aortogram with bifemoral runoff  3.  Catheter selection, left superficial femoral artery and left popliteal artery  4.  Lithotripsy, left superficial femoral artery and popliteal artery  5.  Stent, left superficial femoral and popliteal artery  6.  Conscious sedation, 100 minutes  7.  Closure device, Celt Indications: This is a 67 year old female with end-stage renal disease and history of right leg amputation who has developed a new wound on her left foot.  She comes in today for further evaluation. Procedure:  The patient was identified in the holding area and taken to room 8.  The patient was then placed supine on the table and prepped and draped in the usual sterile fashion.  A time out was called.  Conscious sedation was administered with the use of IV fentanyl and Versed under continuous physician and nurse monitoring.  Heart rate, blood pressure, and oxygen saturation were continuously monitored.  Total sedation time was 100 minutes.  Ultrasound was used to evaluate the right common femoral artery.  It was patent .  A digital ultrasound image was acquired.  A micropuncture needle was used to access the right common femoral artery under ultrasound guidance.  An 018 wire was advanced without resistance and a  micropuncture sheath was placed.  The 018 wire was removed and a benson wire was placed.  The micropuncture sheath was exchanged for a 5 french sheath.  An omniflush catheter was advanced over the wire to the level of L-1.  An abdominal angiogram was obtained.  Next, using the omniflush catheter and a benson wire, the aortic bifurcation was crossed and the catheter was placed into theleft external iliac artery and left runoff was obtained. Findings:  Aortogram: No evidence of renal artery stenosis.  The infrarenal abdominal aorta is widely patent.  Bilateral common and external iliac arteries are widely patent.  Right Lower Extremity: The visualized portions of the right common femoral artery and its distal branches are patent without significant stenosis  Left Lower Extremity: The left common femoral artery is widely patent but heavily calcified as is the profundofemoral artery.  The superficial femoral artery is heavily calcified.  In the midportion there are 2 focal areas of stenoses greater than 50% with heavy calcification.  The artery occludes at the adductor canal.  There is reconstitution of the popliteal artery above the knee.  There is diffuse disease with greater than 50% stenosis within the below-knee popliteal artery.  There is single-vessel runoff via the peroneal artery. Intervention: After the above images were acquired the decision was made to proceed with intervention.  A 6 French 45 cm sheath was advanced into the left superficial femoral artery.  The patient was fully heparinized.  Additional images were performed with the catheter in the superficial femoral artery to better define the disease.  Next using an 035 Glidewire and quick cross catheter, the total occlusion was crossed.  The catheter was then placed in the popliteal artery and additional lower extremity imaging were obtained.  This proved that I was true lumen and also identified more clearly the popliteal disease.  I then advanced a  [place] core wire into the peroneal artery.  I used a 3 x 60 coyote balloon to predilate the total occlusion and then inserted a 5 x 60 shockwave lithotripsy balloon.  Arterial lithotripsy was performed in the popliteal and superficial femoral artery at 6 atm.  I then exchanged out for a heavier wire.  A 12 g wire was then placed into the peroneal artery.  A 4.5 x 60 shockwave balloon was then used to treat the below-knee popliteal artery disease.  I elected to stent the superficial femoral artery because of the heavily calcified total occlusion.  I placed a 7 x 150 Eluvia stent and postdilated it with a 6 mm balloon.  Follow-up imaging showed excellent result however there was 2 more proximal lesions that I felt needed to be stented and so an additional 7 x 150 Eluvia stent was deployed and postdilated with a 6  mm balloon.  Completion imaging showed inline flow through the superficial femoral, popliteal, and peroneal artery which collateralizes out onto the foot.  The groin was then closed with a Celt Impression:  #1  Total occlusion of the distal left superficial femoral and left proximal above-knee popliteal artery successfully crossed.  This was heavily calcified so it was initially treated with shockwave intra-arterial lithotripsy followed by stenting.  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  She needs to continue on Plavix and aspirin.  She has a statin allergy.  Juleen China, M.D., FACS [facility] [place] Office: [phone] Pager:  [phone]   Medications:  sodium chloride     sodium chloride      aspirin EC  81 mg Oral Daily   calcitRIOL  0.25 mcg Oral BID WC   Chlorhexidine Gluconate  Cloth  6 each Topical Q0600   Chlorhexidine Gluconate Cloth  6 each Topical Q0600   clopidogrel  75 mg Oral Q breakfast   DULoxetine  60 mg Oral Daily   ezetimibe  10 mg Oral Daily   heparin  5,000 Units Subcutaneous Q8H   insulin aspart  0-5 Units Subcutaneous QHS   insulin aspart  0-6 Units Subcutaneous TID WC   insulin glargine-yfgn  26 Units Subcutaneous QHS   ketotifen  1 drop Both Eyes BID   metoprolol succinate  50 mg Oral QPM   multivitamin  1 tablet Oral Daily   pregabalin  50 mg Oral TID   sertraline  50 mg Oral Daily   sodium chloride flush  3 mL Intravenous Q12H   sodium chloride flush  3 mL Intravenous Q12H   sucroferric oxyhydroxide  1,000 mg Oral TID WC   And   sucroferric oxyhydroxide  500 mg Oral With snacks    Dialysis Orders: Per last admission  [facility] MWF 3h   84kg  400/500   LUA AVF  15ga  Heparin 1000 units initial bolus then 1000u/hr, stop 1 hour before end of treatment (3200 units IV here).   Assessment/Plan:  L foot ulcer w/known PAD- VVS consulted.  S/p LLE angiogram with  on 4/30.   ESRD -  on MWF.  No dialysis issues, continue current schedule.   Hypertension/volume  - Blood pressure slightly elevated. On Toprol 50mg  qd.  Does not appear volume overloaded, is close to her EDW.  UF as tolerated.   Anemia of CKD - Hgb 8.7 - checking iron studies. Checking Hgb today, may need ESA.  Secondary Hyperparathyroidism -  Calcium and phos within goal. Not on VDRA. Continue phosphorus binders  Nutrition - Renal diet w/fluid restrictions DMT2 - per PMD    [name] Blocker, PA-C 07/22/2022, 8:19 AM  [facility] Pager: [phone]

## 2022-07-22 NOTE — Discharge Summary (Signed)
Physician Discharge Summary  Kathryn Beck ZOX:[id number] DOB: 1955/09/28 DOA: 07/18/2022  PCP: [name], MD  Admit date: 07/18/2022 Discharge date: 07/22/2022 Recommendations for Outpatient Follow-up:  Follow up with PCP in 1 weeks-call for appointment Please obtain BMP/CBC in one week Follow-up with vascular surgery for further monitoring for pvd and left 2nd toe Your blood sugar has been running on lower side so insulin dose has been decreased , may need to slowly increase the dose depending upon your blood sugar, follow-up with PCP in 2-3 days and monitor sugar achs at home   Discharge Dispo: home w/ [facility] Discharge Condition: Stable Code Status:   Code Status: Full Code Diet recommendation:  Diet Order             Diet renal/carb modified with fluid restriction Diet-HS Snack? Nothing; Fluid restriction: 1200 mL Fluid; Room service appropriate? Yes; Fluid consistency: Thin  Diet effective now                    Brief/Interim Summary: 67 y.o.  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  Initially seen at [facility]-subsequently transferred to [facility] for vascular surgery evaluation.  s/p angiogram with lithotripsy, left superficial femoral artery and popliteal artery Dr Myra Gianotti Post op doing well. She  does have discoloration of her left second toe which is being monitored appears dry she will follow-up with vascular outpatient, she will continue on antiplatelet. At this time she will complete dialysis and no further inpatient workup or management needed.  Discussed plan of care with patient's daughter who is agreeable for discharge back to home today I discussed with vascular surgeon and ok for discharge      Discharge Diagnoses:  Principal Problem:   PVD (peripheral vascular disease) (HCC) Active Problems:   ESRD on hemodialysis  (HCC)   Type 2 diabetes mellitus with hyperglycemia (HCC)   Hyperlipidemia   Obesity   HTN (hypertension)   S/P BKA (below knee amputation), right (HCC)   Peripheral vascular complication  Critical left lower extremity ischemia with great toe and second toe ulceration with PVD: 4/30- s/p angiogram with lithotripsy, left superficial femoral artery and popliteal artery Dr Myra Gianotti.  Continue aspirin Plavix, Zetia , unable to take statin due to allergy.  Vascular surgery arranging follow-up with left LE arterial duplex and ABI as outpatient. No further hospitalization needed at this time patient can be discharged after dialysis per vascular  PAD HLD S/P -s/p left BKA by Dr Lajoyce Corners 05/29/22 : Stump healed.  On Zetia and antiplatelets as above  ESRD on HD MWF nephrology following, on HD 5/1> can go home after dialysis  Normocytic anemia/anemia of chronic renal disease: Hemoglobin stable Aranesp/iron per nephrology, monitor  Type 2 diabetes  stable hba1c 6.3 4/16, borderline hypoglycemia so insulin Lantus was decreased> continue to monitor blood sugar at home  Essential hypertension BP stable on metoprolol  Peripheral neuropathy due to diabetes continue Lyrica  Significant events: 4/27>> admit to [facility]   Significant studies: 4/27>> x-ray left foot: Focal bone loss involving the first metatarsal bone-posttraumatic versus infectious   Significant microbiology data: None   Procedures: 4/30- s/p angiogram with lithotripsy, left superficial femoral artery and popliteal artery Dr Myra Gianotti.   Consults: Vascular surgery  Consults: VVS Subjective: Alert and oriented resting comfortably no new complaint Overnight patient has been afebrile blood pressure  doing in 140s to 150s at times higher Labs reviewed from 4/29 LDL 70 cbg in 220s.  Discharge Exam: Vitals:   07/22/22 1227 07/22/22 1230  BP: (!) 127/114 (!) 135/50  Pulse: 79 79  Resp: 19 13  Temp:  98 F (36.7 C)  SpO2: 93% 100%    General: Pt is alert, awake, not in acute distress Cardiovascular: RRR, S1/S2 +, no rubs, no gallops Respiratory: CTA bilaterally, no wheezing, no rhonchi Abdominal: Soft, NT, ND, bowel sounds + Extremities: no edema, no cyanosis, mild discoloration of the left second toe appears dry  Discharge Instructions  Discharge Instructions     Discharge instructions   Complete by: As directed    Your blood sugar has been running on lower side so insulin dose has been decreased , may need to slowly increase the dose depending upon your blood sugar, follow-up with PCP in 2-3 days and monitor sugar achs at home  Follow-up with vascular surgery regarding her left second toe discoloration ischemia, need close follow-up   Please call call MD or return to ER for similar or worsening recurring problem that brought you to hospital or if any fever,nausea/vomiting,abdominal pain, uncontrolled pain, chest pain,  shortness of breath or any other alarming symptoms.  Please follow-up your doctor as instructed in a week time and call the office for appointment.  Please avoid alcohol, smoking, or any other illicit substance and maintain healthy habits including taking your regular medications as prescribed.  You were cared for by a hospitalist during your hospital stay. If you have any questions about your discharge medications or the care you received while you were in the hospital after you are discharged, you can call the unit and ask to speak with the hospitalist on call if the hospitalist that took care of you is not available.  Once you are discharged, your primary care physician will handle any further medical issues. Please note that NO REFILLS for any discharge medications will be authorized once you are discharged, as it is imperative that you return to your primary care physician (or establish a relationship with a primary care physician if you do not have one) for your aftercare needs so that they can  reassess your need for medications and monitor your lab values  Check blood sugar 3 times a day and bedtime at home. If blood sugar running above 200 less than 70 please call your MD to adjust insulin. If blood sugars running less 100 do not use insulin and call MD. If you noticed signs and symptoms of hypoglycemia or low blood sugar like jitteriness, confusion, thirst, tremor, sweating- Check blood sugar, drink sugary drink/biscuits/sweets to increase sugar level and call MD or return to ER.   Increase activity slowly   Complete by: As directed       Allergies as of 07/22/2022       Reactions   Ace Inhibitors Anaphylaxis, Swelling   Penicillins Itching, Swelling, Other (See Comments)   Did it involve swelling of the face/tongue/throat, SOB, or low BP? Unknown Did it involve sudden or severe rash/hives, skin peeling, or any reaction on the inside of your mouth or nose? Unknown Did you need to seek medical attention at a hospital or doctor's office? Unknown When did it last happen?      years  If all above answers are "NO", may proceed with cephalosporin use.   Statins Other (See Comments)   elevated LFT's   Albuterol Swelling  Medication List     STOP taking these medications    Fiasp FlexTouch 100 UNIT/ML FlexTouch Pen Generic drug: insulin aspart   sulfamethoxazole-trimethoprim 800-160 MG tablet Commonly known as: BACTRIM DS       TAKE these medications    aspirin 81 MG chewable tablet CHEW 2 TABLETS BY MOUTH ONCE DAILY What changed: See the new instructions.   azelastine 0.05 % ophthalmic solution Commonly known as: OPTIVAR Place 1 drop into both eyes 2 (two) times daily.   calcitRIOL 0.25 MCG capsule Commonly known as: ROCALTROL TAKE 1 CAPSULE (0.25 MCG TOTAL) BY MOUTH 2 (TWO) TIMES DAILY WITH A MEAL.   clopidogrel 75 MG tablet Commonly known as: PLAVIX Take 1 tablet (75 mg total) by mouth daily with breakfast.   DULoxetine 60 MG capsule Commonly  known as: CYMBALTA TAKE (1) CAPSULE BY MOUTH ONCE DAILY. What changed: See the new instructions.   ezetimibe 10 MG tablet Commonly known as: ZETIA Take 1 tablet (10 mg total) by mouth daily.   FreeStyle Libre 2 Reader Bowen 1 each by Does not apply route daily.   FreeStyle Libre 2 Sensor Misc 1 each by Does not apply route every 14 (fourteen) days.   furosemide 80 MG tablet Commonly known as: LASIX Take 80 mg by mouth 2 (two) times daily.   hydrOXYzine 25 MG tablet Commonly known as: ATARAX Take 25 mg by mouth every 12 (twelve) hours as needed for itching.   Insulin Pen Needle 32G X 4 MM Misc Use 4x a day   metoprolol succinate 50 MG 24 hr tablet Commonly known as: TOPROL-XL TAKE (1) TABLET BY MOUTH DAILY WITH FOOD *TAKE AFTER DIALYSIS* What changed:  how much to take how to take this when to take this additional instructions   OneTouch Delica Lancets 33G Misc Use to check blood sugar 4X daily.   OneTouch Verio test strip Generic drug: glucose blood Use as instructed to check blood sugar 4X daily.   OneTouch Verio w/Device Kit Use to check blood sugar 4X daily.   oxyCODONE-acetaminophen 5-325 MG tablet Commonly known as: PERCOCET/ROXICET Take 1 tablet by mouth every 4 (four) hours as needed. What changed: reasons to take this   pregabalin 50 MG capsule Commonly known as: Lyrica Take 1 capsule (50 mg total) by mouth 3 (three) times daily.   Rena-Vite Rx 1 MG Tabs Take 1 tablet by mouth daily.   sertraline 50 MG tablet Commonly known as: ZOLOFT TAKE ONE TABLET BY MOUTH EVERY DAY   Toujeo Max SoloStar 300 UNIT/ML Solostar Pen Generic drug: insulin glargine (2 Unit Dial) Inject 30 Units into the skin daily. May need to slowly increase the dose depending upon your blood sugar, follow-up with PCP What changed:  how much to take additional instructions   [name] 500 MG chewable tablet Generic drug: sucroferric oxyhydroxide Chew 500-1,000 mg by mouth See  admin instructions. Take 1000mg  (2 tablets) by mouth with meals and 500mg  (1 tablet) with snacks        Follow-up Information     [name], MD Follow up in 1 week(s).   Specialty: Family Medicine Contact information: [address] [place] [address] [phone]         Nada Libman, MD Follow up in 1 week(s).   Specialties: Vascular Surgery, Cardiology Contact information: [address] [place] [address] [phone]                Allergies  Allergen Reactions  Ace Inhibitors Anaphylaxis and Swelling   Penicillins Itching, Swelling and Other (See Comments)    Did it involve swelling of the face/tongue/throat, SOB, or low BP? Unknown Did it involve sudden or severe rash/hives, skin peeling, or any reaction on the inside of your mouth or nose? Unknown Did you need to seek medical attention at a hospital or doctor's office? Unknown When did it last happen?      years  If all above answers are "NO", may proceed with cephalosporin use.    Statins Other (See Comments)    elevated LFT's     Albuterol Swelling    The results of significant diagnostics from this hospitalization (including imaging, microbiology, ancillary and laboratory) are listed below for reference.    Microbiology: No results found for this or any previous visit (from the past 240 hour(s)).  Procedures/Studies: PERIPHERAL VASCULAR CATHETERIZATION  Result Date: 07/21/2022 Images from the original result were not included. Patient name: Kathryn Beck MRN: [id number] DOB: Nov 04, 1955 Sex: female 07/21/2022 Pre-operative Diagnosis: Left toe ulcer Post-operative diagnosis:  Same Surgeon:  Durene Cal Procedure Performed:  1.  Ultrasound-guided access, right femoral artery  2.  Abdominal aortogram with bifemoral runoff  3.  Catheter selection, left superficial femoral artery and left popliteal artery  4.  Lithotripsy, left superficial femoral artery and popliteal artery   5.  Stent, left superficial femoral and popliteal artery  6.  Conscious sedation, 100 minutes  7.  Closure device, Celt Indications: This is a 67 year old female with end-stage renal disease and history of right leg amputation who has developed a new wound on her left foot.  She comes in today for further evaluation. Procedure:  The patient was identified in the holding area and taken to room 8.  The patient was then placed supine on the table and prepped and draped in the usual sterile fashion.  A time out was called.  Conscious sedation was administered with the use of IV fentanyl and Versed under continuous physician and nurse monitoring.  Heart rate, blood pressure, and oxygen saturation were continuously monitored.  Total sedation time was 100 minutes.  Ultrasound was used to evaluate the right common femoral artery.  It was patent .  A digital ultrasound image was acquired.  A micropuncture needle was used to access the right common femoral artery under ultrasound guidance.  An 018 wire was advanced without resistance and a micropuncture sheath was placed.  The 018 wire was removed and a benson wire was placed.  The micropuncture sheath was exchanged for a 5 french sheath.  An omniflush catheter was advanced over the wire to the level of L-1.  An abdominal angiogram was obtained.  Next, using the omniflush catheter and a benson wire, the aortic bifurcation was crossed and the catheter was placed into theleft external iliac artery and left runoff was obtained. Findings:  Aortogram: No evidence of renal artery stenosis.  The infrarenal abdominal aorta is widely patent.  Bilateral common and external iliac arteries are widely patent.  Right Lower Extremity: The visualized portions of the right common femoral artery and its distal branches are patent without significant stenosis  Left Lower Extremity: The left common femoral artery is widely patent but heavily calcified as is the profundofemoral artery.  The  superficial femoral artery is heavily calcified.  In the midportion there are 2 focal areas of stenoses greater than 50% with heavy calcification.  The artery occludes at the adductor canal.  There is reconstitution of the popliteal  artery above the knee.  There is diffuse disease with greater than 50% stenosis within the below-knee popliteal artery.  There is single-vessel runoff via the peroneal artery. Intervention: After the above images were acquired the decision was made to proceed with intervention.  A 6 French 45 cm sheath was advanced into the left superficial femoral artery.  The patient was fully heparinized.  Additional images were performed with the catheter in the superficial femoral artery to better define the disease.  Next using an 035 Glidewire and quick cross catheter, the total occlusion was crossed.  The catheter was then placed in the popliteal artery and additional lower extremity imaging were obtained.  This proved that I was true lumen and also identified more clearly the popliteal disease.  I then advanced a [place] core wire into the peroneal artery.  I used a 3 x 60 coyote balloon to predilate the total occlusion and then inserted a 5 x 60 shockwave lithotripsy balloon.  Arterial lithotripsy was performed in the popliteal and superficial femoral artery at 6 atm.  I then exchanged out for a heavier wire.  A 12 g wire was then placed into the peroneal artery.  A 4.5 x 60 shockwave balloon was then used to treat the below-knee popliteal artery disease.  I elected to stent the superficial femoral artery because of the heavily calcified total occlusion.  I placed a 7 x 150 Eluvia stent and postdilated it with a 6 mm balloon.  Follow-up imaging showed excellent result however there was 2 more proximal lesions that I felt needed to be stented and so an additional 7 x 150 Eluvia stent was deployed and postdilated with a 6 mm balloon.  Completion imaging showed inline flow through the superficial  femoral, popliteal, and peroneal artery which collateralizes out onto the foot.  The groin was then closed with a Celt Impression:  #1  Total occlusion of the distal left superficial femoral and left proximal above-knee popliteal artery successfully crossed.  This was heavily calcified so it was initially treated with shockwave intra-arterial lithotripsy followed by stenting.  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  She needs to continue on Plavix and aspirin.  She has a statin allergy.  Juleen China, M.D., FACS [facility] Office: [phone] Pager:  [phone]   VAS [place] ABI WITH/WO TBI  Result Date: 07/19/2022  LOWER EXTREMITY DOPPLER STUDY Patient Name:  Kathryn Beck  Date of Exam:   07/19/2022 Medical Rec #: [id number]        Accession #:    [id number] Date of Birth: [birth year]        Patient Gender: F Patient Age:   16 years Exam Location:  [facility] Procedure:      VAS [place] ABI WITH/WO TBI Referring Phys: Heath Lark --------------------------------------------------------------------------------  Indications: Ulceration of left 1st and 2nd toes, and peripheral artery              disease. High Risk Factors: Hypertension, hyperlipidemia, Diabetes.  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 Comparison Study: Prior ABI done 04/25/22 Performing Technologist: Sherren Kerns RVS  Examination Guidelines: A complete evaluation includes at minimum, Doppler waveform signals and systolic blood pressure reading at the  level of bilateral brachial, anterior tibial, and posterior tibial arteries, when vessel segments are accessible. Bilateral testing is considered an integral part of a complete  examination. Photoelectric Plethysmograph (PPG) waveforms and toe systolic pressure readings are included as required and additional duplex testing as needed. Limited examinations for reoccurring indications may be performed as noted.  ABI Findings: +---------+------------------+-----+---------+--------+ Right    Rt Pressure (mmHg)IndexWaveform Comment  +---------+------------------+-----+---------+--------+ Brachial 170                    triphasic         +---------+------------------+-----+---------+--------+ PTA                                      BKA      +---------+------------------+-----+---------+--------+ DP                                       BKA      +---------+------------------+-----+---------+--------+ Great Toe                                BKA      +---------+------------------+-----+---------+--------+ +---------+------------------+-----+----------+-------------------+ Left     Lt Pressure (mmHg)IndexWaveform  Comment             +---------+------------------+-----+----------+-------------------+ Brachial                                  restricted dialysis +---------+------------------+-----+----------+-------------------+ PTA      254               1.49 monophasic                    +---------+------------------+-----+----------+-------------------+ DP       98                0.58 monophasic                    +---------+------------------+-----+----------+-------------------+ Great Toe31                0.18                               +---------+------------------+-----+----------+-------------------+ +-------+-----------+-----------+------------+------------+ ABI/TBIToday's ABIToday's TBIPrevious ABIPrevious TBI +-------+-----------+-----------+------------+------------+ Right  BKA        BKA        0.40        absent       +-------+-----------+-----------+------------+------------+ Left   1.49       0.18        0.42        absent       +-------+-----------+-----------+------------+------------+  Compared to prior study on 04/25/22.  Summary: Right: BKA. Left: Resting left ankle-brachial index indicates noncompressible left lower extremity arteries. The left toe-brachial index is abnormal. *See table(s) above for measurements and observations.  Electronically signed by Heath Lark on 07/19/2022 at 10:56:56 AM.    Final    DG Foot Complete Left  Result Date: 07/18/2022 CLINICAL DATA:  Wound to left second toe. 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 EXAM: LEFT FOOT - COMPLETE 3+ VIEW COMPARISON:  07/06/2021 FINDINGS: There is diffuse soft tissue  edema. Extensive vascular calcifications. Chronic fracture deformities involving the head of the second, third, and fourth metatarsal bones are identified with increased surrounding callus formation. These are new when compared with the exam from 11/05/2021. Suspect healing fracture with increased callus formation and sclerosis involving the base of the fifth proximal phalanx. Hallux valgus deformity with degenerative changes at the first MTP joint identified. Focal bone loss with cortical irregularity involving the medial head of the first metatarsal bone is identified. This may be posttraumatic versus infectious in etiology. Progressive sclerosis is noted involving the lateral base of the first proximal phalanx. IMPRESSION: 1. Focal bone loss with cortical irregularity involving the medial head of the first metatarsal bone. This may be posttraumatic versus infectious in etiology. 2. Progressive sclerosis involving the lateral base of the first proximal phalanx. 3. Chronic, healing fracture deformities involving the head of the second, third, and fourth metatarsal bones with increased surrounding callus formation. 4. Diffuse soft tissue edema. 5. Hallux valgus deformity with degenerative changes at the first MTP joint. Electronically Signed   By:  Signa Kell M.D.   On: 07/18/2022 11:40    Labs: BNP (last 3 results) No results for input(s): "BNP" in the last 8760 hours. Basic Metabolic Panel: Recent Labs  Lab 07/18/22 1135 07/19/22 0235 07/19/22 0236 07/20/22 0302  NA 139  --  141 142  K 4.3  --  3.7 3.5  CL 102  --  104 105  CO2 27  --  26 26  GLUCOSE 179*  --  235* 200*  BUN 14  --  27* 39*  CREATININE 4.61*  --  5.96* 7.60*  CALCIUM 9.1  --  9.2 9.1  MG  --  2.0  --   --   PHOS  --  3.9 3.9 4.7*   Liver Function Tests: Recent Labs  Lab 07/19/22 0236 07/20/22 0302  ALBUMIN 2.9* 2.8*   No results for input(s): "LIPASE", "AMYLASE" in the last 168 hours. No results for input(s): "AMMONIA" in the last 168 hours. CBC: Recent Labs  Lab 07/18/22 1135 07/19/22 0235 07/20/22 0302  WBC 9.9 7.1 7.4  NEUTROABS 7.4  --   --   HGB 9.2* 8.9* 8.7*  HCT 30.4* 28.5* 28.9*  MCV 82.6 81.7 83.0  PLT 333 295 318   Cardiac Enzymes: No results for input(s): "CKTOTAL", "CKMB", "CKMBINDEX", "TROPONINI" in the last 168 hours. BNP: Invalid input(s): "POCBNP" CBG: Recent Labs  Lab 07/21/22 0723 07/21/22 1136 07/21/22 1729 07/21/22 2108 07/22/22 0734  GLUCAP 143* 123* 121* 215* 226*   D-Dimer No results for input(s): "DDIMER" in the last 72 hours. Hgb A1c No results for input(s): "HGBA1C" in the last 72 hours. Lipid Profile Recent Labs    07/22/22 0400  CHOL 156  HDL 26*  LDLCALC 70  TRIG 161*  CHOLHDL 6.0   Thyroid function studies No results for input(s): "TSH", "T4TOTAL", "T3FREE", "THYROIDAB" in the last 72 hours.  Invalid input(s): "FREET3" Anemia work up Recent Labs    07/21/22 0604  FERRITIN 799*  TIBC 154*  IRON 32  Urinalysis    Component Value Date/Time   COLORURINE YELLOW 11/07/2017 2340   APPEARANCEUR HAZY (A) 11/07/2017 2340   LABSPEC 1.017 11/07/2017 2340   PHURINE 5.0 11/07/2017 2340   GLUCOSEU 150 (A) 11/07/2017 2340   HGBUR NEGATIVE 11/07/2017 2340   BILIRUBINUR NEGATIVE  11/07/2017 2340   BILIRUBINUR neg 02/15/2017 0913   KETONESUR NEGATIVE 11/07/2017 2340   PROTEINUR >=300 (A) 11/07/2017 2340  UROBILINOGEN 0.2 02/15/2017 0913   NITRITE NEGATIVE 11/07/2017 2340   LEUKOCYTESUR NEGATIVE 11/07/2017 2340  Sepsis Labs Recent Labs  Lab 07/18/22 1135 07/19/22 0235 07/20/22 0302  WBC 9.9 7.1 7.4  Microbiology No results found for this or any previous visit (from the past 240 hour(s)). Time coordinating discharge: 25 minutes  SIGNED: Lanae Boast, MD  [facility] 07/22/2022, 1:02 PM  If 7PM-7AM, please contact night-coverage [email or web address]

## 2022-07-22 NOTE — Progress Notes (Signed)
   07/22/22 1230  Vitals  Temp 98 F (36.7 C)  Pulse Rate 79  Resp 13  BP (!) 135/50  SpO2 100 %  Weight 81.6 kg  Type of Weight Post-Dialysis  Post Treatment  Dialyzer Clearance Lightly streaked  Duration of HD Treatment -hour(s) 3.5 hour(s)  Hemodialysis Intake (mL) 0 mL  Liters Processed 82.3  Fluid Removed (mL) 1900 mL  Tolerated HD Treatment Yes  AVG/AVF Arterial Site Held (minutes) 10 minutes  AVG/AVF Venous Site Held (minutes) 10 minutes   Received patient in bed to unit.  Alert and oriented.  Informed consent signed and in chart.   TX duration:3.5  Patient tolerated well.  Transported back to the room  Alert, without acute distress.  Hand-off given to patient's nurse.   Access used: LAVF Access issues: NONE  Total UF removed: 1.9L Medication(s) given: NONE    [facility]

## 2022-07-22 NOTE — Hospital Course (Addendum)
67 y.o.  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  Initially seen at [facility]-subsequently transferred to [facility] for vascular surgery evaluation.  s/p angiogram with lithotripsy, left superficial femoral artery and popliteal artery Dr Myra Gianotti Post op doing well. She  does have discoloration of her left second toe which is being monitored appears dry she will follow-up with vascular outpatient, she will continue on antiplatelet. At this time she will complete dialysis and no further inpatient workup or management needed.  Discussed plan of care with patient's daughter who is agreeable for discharge back to home today I discussed with vascular surgeon and ok for discharge

## 2022-07-22 NOTE — Procedures (Signed)
I was present at this dialysis session. I have reviewed the session itself and made appropriate changes.   Vital signs in last 24 hours:  Temp:  [97.8 F (36.6 C)-98.8 F (37.1 C)] 98.1 F (36.7 C) (05/01 0800) Pulse Rate:  [68-87] 71 (05/01 0830) Resp:  [14-20] 17 (05/01 0830) BP: (141-188)/(52-82) 156/58 (05/01 0830) SpO2:  [90 %-100 %] 96 % (05/01 0830) Weight:  [83.5 kg-83.7 kg] 83.7 kg (05/01 0808) Weight change:  Filed Weights   07/22/22 0447 07/22/22 0800 07/22/22 0808  Weight: 83.5 kg 83.7 kg 83.7 kg    Recent Labs  Lab 07/20/22 0302  NA 142  K 3.5  CL 105  CO2 26  GLUCOSE 200*  BUN 39*  CREATININE 7.60*  CALCIUM 9.1  PHOS 4.7*    Recent Labs  Lab 07/18/22 1135 07/19/22 0235 07/20/22 0302  WBC 9.9 7.1 7.4  NEUTROABS 7.4  --   --   HGB 9.2* 8.9* 8.7*  HCT 30.4* 28.5* 28.9*  MCV 82.6 81.7 83.0  PLT 333 295 318    Scheduled Meds:  aspirin EC  81 mg Oral Daily   calcitRIOL  0.25 mcg Oral BID WC   Chlorhexidine Gluconate Cloth  6 each Topical Q0600   Chlorhexidine Gluconate Cloth  6 each Topical Q0600   clopidogrel  75 mg Oral Q breakfast   DULoxetine  60 mg Oral Daily   ezetimibe  10 mg Oral Daily   heparin  5,000 Units Subcutaneous Q8H   insulin aspart  0-5 Units Subcutaneous QHS   insulin aspart  0-6 Units Subcutaneous TID WC   insulin glargine-yfgn  26 Units Subcutaneous QHS   ketotifen  1 drop Both Eyes BID   metoprolol succinate  50 mg Oral QPM   multivitamin  1 tablet Oral Daily   pregabalin  50 mg Oral TID   sertraline  50 mg Oral Daily   sodium chloride flush  3 mL Intravenous Q12H   sodium chloride flush  3 mL Intravenous Q12H   sucroferric oxyhydroxide  1,000 mg Oral TID WC   And   sucroferric oxyhydroxide  500 mg Oral With snacks   Continuous Infusions:  sodium chloride     sodium chloride     PRN Meds:.sodium chloride, sodium chloride, acetaminophen **OR** acetaminophen, hydrALAZINE, hydrOXYzine, labetalol, ondansetron **OR**  ondansetron (ZOFRAN) IV, sodium chloride flush, sodium chloride flush   Irena Cords,  MD 07/22/2022, 8:58 AM

## 2022-07-22 NOTE — Progress Notes (Signed)
D/C order noted. Contacted [facility] and spoke to [place]. Clinic advised of pt's d/c today and that pt should resume care on Friday. Will fax d/c summary and last renal note for continuation of care.   Olivia Canter Renal Navigator [phone]

## 2022-07-23 ENCOUNTER — Ambulatory Visit: Payer: Medicare PPO | Admitting: Podiatry

## 2022-07-23 ENCOUNTER — Telehealth: Payer: Self-pay

## 2022-07-23 ENCOUNTER — Telehealth: Payer: Self-pay | Admitting: *Deleted

## 2022-07-23 NOTE — Transitions of Care (Post Inpatient/ED Visit) (Signed)
07/23/2022  Name: Kathryn Beck MRN: [id number] DOB: 1955-06-11  Today's TOC FU Call Status: Today's TOC FU Call Status:: Successful TOC FU Call Competed TOC FU Call Complete Date: 07/23/22  Transition Care Management Follow-up Telephone Call Date of Discharge: 07/22/22 Discharge Facility: [facility]) Type of Discharge: Inpatient Admission Primary Inpatient Discharge Diagnosis:: peripheral vascular disease How have you been since you were released from the hospital?: Better (Spoke with daughter who states that she is feeling better and her foot is doing good) Any questions or concerns?: No  Items Reviewed: Did you receive and understand the discharge instructions provided?: Yes Medications obtained,verified, and reconciled?: Yes (Medications Reviewed) Any new allergies since your discharge?: No Dietary orders reviewed?: NA Do you have support at home?: Yes People in Home: child(ren), adult Name of Support/Comfort Primary Source: [name] daughter  Medications Reviewed Today: Medications Reviewed Today     Reviewed by Yetta Glassman, RN (Registered Nurse) on 07/23/22 at 1052  Med List Status: <None>   Medication Order Taking? Sig Documenting Provider Last Dose Status Informant  aspirin 81 MG chewable tablet [id number] Yes CHEW 2 TABLETS BY MOUTH ONCE DAILY  Patient taking differently: Chew 162 mg by mouth daily.   [name], MD Taking Active Self  azelastine (OPTIVAR) 0.05 % ophthalmic solution [id number] No Place 1 drop into both eyes 2 (two) times daily.  Patient not taking: Reported on 07/23/2022   [name], MD Not Taking Active Self  B Complex-C-Folic Acid (RENA-VITE RX) 1 MG TABS [id number] Yes Take 1 tablet by mouth daily. [provider] Taking Active Self  Blood Glucose Monitoring Suppl [facility] VERIO) w/Device KIT [id number] Yes Use to check blood sugar 4X daily. Carlus Pavlov, MD Taking Active Self  calcitRIOL (ROCALTROL)  0.25 MCG capsule [id number] Yes TAKE 1 CAPSULE (0.25 MCG TOTAL) BY MOUTH 2 (TWO) TIMES DAILY WITH A MEAL. Roma Kayser, MD Taking Active Self  clopidogrel (PLAVIX) 75 MG tablet [id number] Yes Take 1 tablet (75 mg total) by mouth daily with breakfast. Dorcas Carrow, MD Taking Active Self  Continuous Blood Gluc Receiver (FREESTYLE LIBRE 2 READER) DEVI [id number] Yes 1 each by Does not apply route daily. Carlus Pavlov, MD Taking Active Self  Continuous Blood Gluc Sensor (FREESTYLE LIBRE 2 SENSOR) [place] [id number] Yes 1 each by Does not apply route every 14 (fourteen) days. Carlus Pavlov, MD Taking Active Self  DULoxetine (CYMBALTA) 60 MG capsule [id number] Yes TAKE (1) CAPSULE BY MOUTH ONCE DAILY.  Patient taking differently: Take 60 mg by mouth daily.   [name], MD Taking Active Self  ezetimibe (ZETIA) 10 MG tablet [id number] Yes Take 1 tablet (10 mg total) by mouth daily. [name], MD Taking Active Self    Discontinued 05/28/11 1302 (Patient has not taken in last 30 days)   furosemide (LASIX) 80 MG tablet [id number] Yes Take 80 mg by mouth 2 (two) times daily. [provider] Taking Active Self    Discontinued 05/28/11 1302 (Change in therapy)   glucose blood (ONETOUCH VERIO) test strip [id number] Yes Use as instructed to check blood sugar 4X daily. Carlus Pavlov, MD Taking Active Self  hydrOXYzine (ATARAX) 25 MG tablet [id number] Yes Take 25 mg by mouth every 12 (twelve) hours as needed for itching. [provider] Taking Active Self  insulin glargine, 2 Unit Dial, (TOUJEO MAX SOLOSTAR) 300 UNIT/ML Solostar Pen [id number] Yes Inject 30 Units into the skin daily. May need to slowly increase the dose depending  upon your blood sugar, follow-up with PCP Lanae Boast, MD Taking Active   Insulin Pen Needle 32G X 4 MM MISC [id number] Yes Use 4x a day Carlus Pavlov, MD Taking Active Self  metoprolol succinate (TOPROL-XL) 50 MG 24 hr tablet [id number] Yes  TAKE (1) TABLET BY MOUTH DAILY WITH FOOD *TAKE AFTER DIALYSIS*  Patient taking differently: Take 50 mg by mouth daily.   [name], MD Taking Active Self           Med Note Haywood Lasso   NWG Jul 18, 2022  3:34 PM)    Dola Argyle Lancets 33G MISC [id number] Yes Use to check blood sugar 4X daily. Carlus Pavlov, MD Taking Active Self  oxyCODONE-acetaminophen (PERCOCET/ROXICET) 5-325 MG tablet [id number] No Take 1 tablet by mouth every 4 (four) hours as needed.  Patient not taking: Reported on 07/23/2022   [name], MD Not Taking Active Self  pregabalin (LYRICA) 50 MG capsule [id number] Yes Take 1 capsule (50 mg total) by mouth 3 (three) times daily. [name], MD Taking Active Self  sertraline (ZOLOFT) 50 MG tablet [id number] Yes TAKE ONE TABLET BY MOUTH EVERY DAY [name], MD Taking Active Self  [name] 500 MG chewable tablet [id number] Yes Chew 500-1,000 mg by mouth See admin instructions. Take 1000mg  (2 tablets) by mouth with meals and 500mg  (1 tablet) with snacks [provider] Taking Active Self  Med List Note Rolene Course 07/18/22 1635): Patient has dialysis on MWF            Home Care and Equipment/Supplies: Were Home Health Services Ordered?: NA Any new equipment or medical supplies ordered?: NA  Functional Questionnaire: Do you need assistance with bathing/showering or dressing?: Yes (daughter assists on dialysis days) Do you need assistance with meal preparation?: No Do you need assistance with eating?: No Do you have difficulty maintaining continence: No Do you need assistance with getting out of bed/getting out of a chair/moving?: Yes (some days needs more help) Do you have difficulty managing or taking your medications?: No (daughter fills pill boxes)  Follow up appointments reviewed: PCP Follow-up appointment confirmed?: Yes Date of PCP follow-up appointment?: 08/04/22 Follow-up Provider: Albertha Ghee Specialist  [facility] Follow-up appointment confirmed?: No Reason Specialist Follow-Up Not Confirmed: Patient has Specialist Provider Number and will Call for Appointment (daughter plans to call schedule) Do you need transportation to your follow-up appointment?: No Do you understand care options if your condition(s) worsen?: Yes-patient verbalized understanding TOC Interventions Today    Flowsheet Row Most Recent Value  TOC Interventions   TOC Interventions Discussed/Reviewed TOC Interventions Discussed, Post discharge activity limitations per provider, Arranged PCP follow up less than 12 days/Care Guide scheduled       Interventions Today    Flowsheet Row Most Recent Value  Chronic Disease   Chronic disease during today's visit Other  [PVD]  General Interventions   General Interventions Discussed/Reviewed General Interventions Discussed, Doctor Visits, Communication with  Doctor Visits Discussed/Reviewed Doctor Visits Discussed, PCP, Specialist  PCP/Specialist Visits Compliance with follow-up visit  Communication with Social Work  [Pt currently active with LCSW wiht next call 07/28/22]  Education Interventions   Education Provided Provided Education  Provided Verbal Education On Blood Sugar Monitoring, When to see the doctor  [Reviewed to notify provider if CBGs increase]  Pharmacy Interventions   Pharmacy Dicussed/Reviewed Pharmacy Topics Discussed       SDOH Interventions Today    Flowsheet Row Most Recent Value  SDOH  Interventions   Food Insecurity Interventions Intervention Not Indicated  Transportation Interventions Intervention Not Indicated       SIGNATURE Dudley Major RN, [facility], CDE Care Management Coordinator [facility] Care Management [phone]

## 2022-07-23 NOTE — Progress Notes (Signed)
  Care Coordination  Note  07/23/2022 Name: [name] MRN: [id number] DOB: 1955/10/17  [name] is a 67 y.o. year old primary care patient of [name], MD. I reached out to [name] by phone today to assist with scheduling a follow up appointment. [name] verbally consented to my assistance.       Follow up plan: Hospital Follow Up appointment scheduled with 08/04/22  Gwenevere Ghazi  Care Coordination Care Guide  Direct Dial: [phone]

## 2022-07-24 ENCOUNTER — Other Ambulatory Visit: Payer: Self-pay | Admitting: Family Medicine

## 2022-07-24 DIAGNOSIS — N186 End stage renal disease: Secondary | ICD-10-CM | POA: Diagnosis not present

## 2022-07-24 DIAGNOSIS — N2581 Secondary hyperparathyroidism of renal origin: Secondary | ICD-10-CM | POA: Diagnosis not present

## 2022-07-24 DIAGNOSIS — Z992 Dependence on renal dialysis: Secondary | ICD-10-CM | POA: Diagnosis not present

## 2022-07-27 DIAGNOSIS — Z992 Dependence on renal dialysis: Secondary | ICD-10-CM | POA: Diagnosis not present

## 2022-07-27 DIAGNOSIS — N2581 Secondary hyperparathyroidism of renal origin: Secondary | ICD-10-CM | POA: Diagnosis not present

## 2022-07-27 DIAGNOSIS — N186 End stage renal disease: Secondary | ICD-10-CM | POA: Diagnosis not present

## 2022-07-28 ENCOUNTER — Encounter: Payer: Self-pay | Admitting: *Deleted

## 2022-07-28 ENCOUNTER — Ambulatory Visit: Payer: Self-pay | Admitting: *Deleted

## 2022-07-28 NOTE — Patient Outreach (Signed)
Care Coordination   Follow Up Visit Note   07/28/2022  Name: Kathryn Beck MRN: [id number] DOB: 24-Sep-1955  Kathryn Beck is a 67 y.o. year old female who sees Kathryn Perches, MD for primary care. I spoke with Kathryn Beck and daughter, Kathryn Beck by phone today.  What matters to the patients health and wellness today?  Receive Assistance Applying for Aid & Attendance Benefits, through [employer].   Goals Addressed             This Visit's Progress    Receive Assistance Applying for Aid & Attendance Benefits, through [employer].   On track    Care Coordination Interventions:  Interventions Today    Flowsheet Row Most Recent Value  Chronic Disease   Chronic disease during today's visit Diabetes, Hypertension (HTN), Chronic Kidney Disease/End Stage Renal Disease (ESRD), Congestive Heart Failure (CHF), Other  [Neuropathy, Chronic Pain Syndrome, Anxiety, Depression, Memory Loss, Neurocognitive Disorder & Requires Maximum Assistance with Activities of Daily Living]  General Interventions   General Interventions Discussed/Reviewed General Interventions Reviewed, Annual Eye Exam, General Interventions Discussed, Labs, Annual Foot Exam, Lipid Profile, Durable Medical Equipment (DME), Vaccines, Health Screening, [employer], Doctor Visits, Communication with, Level of Care  [Primary Care Provider]  Labs Hgb A1c every 3 months, Kidney Function  Vaccines COVID-19, Flu, Pneumonia, RSV, Shingles, Tetanus/Pertussis/Diphtheria  [Encouraged]  Doctor Visits Discussed/Reviewed Doctor Visits Discussed, Doctor Visits Reviewed, Annual Wellness Visits, PCP, Specialist  [Encouraged]  Health Screening Colonoscopy, Mammogram  [Encouraged]  Durable Medical Equipment (DME) BP Cuff, Glucomoter, Environmental consultant, Wheelchair, Physicist, medical  PCP/Specialist Visits Compliance with follow-up visit  Communication with PCP/Specialists, RN  Level of Care  Adult Daycare, Air traffic controller, Assisted Living, Skilled Nursing Facility, Teaching laboratory technician Medicaid, Personal Care Services  Exercise Interventions   Exercise Discussed/Reviewed Exercise Discussed, Exercise Reviewed, Physical Activity, Assistive device use and maintanence  Physical Activity Discussed/Reviewed Physical Activity Discussed, Physical Activity Reviewed, Types of exercise, Home Exercise Program (HEP)  [Encouraged]  Education Interventions   Education Provided Provided Therapist, sports, Provided Web-based Education, Provided Education  Provided Verbal Education On Nutrition, Foot Care, Eye Care, Labs, Blood Sugar Monitoring, Mental Health/Coping with Illness, Applications, Exercise, Medication, When to see the doctor, [employer], Human resources officer, Personal Care Services  Mental Health Interventions   Mental Health Discussed/Reviewed Mental Health Discussed, Mental Health Reviewed, Coping Strategies, Crisis, Anxiety, Depression, Grief and Loss, Substance Abuse, Suicide  Nutrition Interventions   Nutrition Discussed/Reviewed Nutrition Discussed, Nutrition Reviewed, Adding fruits and vegetables, Fluid intake, Decreasing sugar intake, Increaing proteins, Decreasing fats, Decreasing salt  Pharmacy Interventions   Pharmacy Dicussed/Reviewed Pharmacy Topics Discussed, Pharmacy Topics Reviewed, Medication Adherence, Affording Medications, Referral to Pharmacist  Medication Adherence Unable to refill medication  Referral to Pharmacist Cannot afford medications  Safety Interventions   Safety Discussed/Reviewed Safety Discussed, Safety Reviewed, Fall Risk, Home Safety  Home Safety Assistive Devices, Need for home safety assessment, Refer for home visit, Contact provider for referral to PT/OT, Refer for community resources, Contact home health agency  Advanced Directive Interventions   Advanced Directives Discussed/Reviewed Advanced Directives  Discussed, Advanced Directives Reviewed, Advanced Care Planning, Provided resource for acquiring and filling out documents     Active Listening & Reflection Utilized.  Emotional Support Provided. Verbalization of Feelings Encouraged.  Feelings of Sadness Regarding Loss of Independence Acknowledged. Symptoms of Fatigue Validated. Self-Enrollment in Caregiver Support Group of Interest Emphasized. Solution-Focused Strategies Implemented. Problem Solving Interventions Employed. Task-Centered Solutions  Activated.  Cognitive Behavioral Therapy Initiated.  Client-Centered Therapy Performed. Acceptance & Commitment Therapy Indicated. CSW Collaboration with Patient & Daughter, Kathryn Beck to Confirm Interest in Patient Receiving An Additional 2 Weeks (14 Precooked Frozen Meals) of [employer] [phone]). CSW Collaboration with Kathryn Beck, Care Management Assistant with [facility], to Request Approval of An Additional 2 Weeks (14 Precooked Frozen Meals) of [facility] [employer] [phone]). CSW Collaboration with Patient & Daughter, Kathryn Beck to Report Approval of An Additional 2 Weeks (14 Precooked Frozen Meals) of [employer] [phone]) for Patient, Delivered Directly to The Home.   CSW Collaboration with Patient & Daughter, Kathryn Beck to [employer] of [employer] [phone]), a Free Benefit to [facility] Recipients. CSW Collaboration with Patient & Daughter, Kathryn Beck to Encourage Attendance at [facility], Scheduled on 08/12/2022 at 4:00 PM, at [facility] of Conni Elliot [phone]), to [employer] with Completion of Simple Janalyn Harder Powers of [address], Healthcare Powers of Attorney & Living [place]. CSW Collaboration with Daughter, Kathryn Beck to Confirm  Application for Aid & Attendance Benefits Almost Completed, Only Requiring Review & Signature from Patient. CSW Collaboration with Daughter, Kathryn Beck to Confirm Intent to [employer] Application for Aid & Attendance Benefits to [facility] [phone]), No Later than 08/10/2022.  CSW Collaboration with Patient & Daughter, Kathryn Beck to Control and instrumentation engineer with [facility] [phone]), to Schedule Appointment to Receive Assistance with Completion of Application for Aid & Attendance Benefits. CSW Collaboration with Patient & Daughter, Kathryn Beck to [employer] with CSW [phone]), if She Has Questions, Needs Assistance, or If Additional Social Work Needs Are Identified Between Now & Our Next Scheduled Follow-Up Outreach Call.      SDOH assessments and interventions completed:  Yes.  Care Coordination Interventions:  Yes, provided.   Follow up plan: Follow up call scheduled for 08/18/2022 at 9:30 am.  Encounter Outcome:  Pt. Visit Completed.   Danford Bad, BSW, MSW, LCSW  Licensed Restaurant manager, fast food [facility]  Mailing [place] [address], [place], [place] [address] Physical Address-[address], [place] [address], [place] [address] Toll Free Main # [phone] Fax # [phone] Cell # [phone] [email or web address]

## 2022-07-28 NOTE — Patient Instructions (Signed)
Visit Information  Thank you for taking time to visit with me today. Please don't hesitate to contact me if I can be of assistance to you.   Following are the goals we discussed today:   Goals Addressed             This Visit's Progress    Receive Assistance Applying for Aid & Attendance Benefits, through [employer].   On track    Care Coordination Interventions:  Interventions Today    Flowsheet Row Most Recent Value  Chronic Disease   Chronic disease during today's visit Diabetes, Hypertension (HTN), Chronic Kidney Disease/End Stage Renal Disease (ESRD), Congestive Heart Failure (CHF), Other  [Neuropathy, Chronic Pain Syndrome, Anxiety, Depression, Memory Loss, Neurocognitive Disorder & Requires Maximum Assistance with Activities of Daily Living]  General Interventions   General Interventions Discussed/Reviewed General Interventions Reviewed, Annual Eye Exam, General Interventions Discussed, Labs, Annual Foot Exam, Lipid Profile, Durable Medical Equipment (DME), Vaccines, Health Screening, [employer], Doctor Visits, Communication with, Level of Care  [Primary Care Provider]  Labs Hgb A1c every 3 months, Kidney Function  Vaccines COVID-19, Flu, Pneumonia, RSV, Shingles, Tetanus/Pertussis/Diphtheria  [Encouraged]  Doctor Visits Discussed/Reviewed Doctor Visits Discussed, Doctor Visits Reviewed, Annual Wellness Visits, PCP, Specialist  [Encouraged]  Health Screening Colonoscopy, Mammogram  [Encouraged]  Durable Medical Equipment (DME) BP Cuff, Glucomoter, Environmental consultant, Wheelchair, Physicist, medical  PCP/Specialist Visits Compliance with follow-up visit  Communication with PCP/Specialists, RN  Level of Care Adult Daycare, Air traffic controller, Assisted Living, Skilled Nursing Facility, Teaching laboratory technician Medicaid, Personal Care Services  Exercise Interventions   Exercise Discussed/Reviewed Exercise Discussed, Exercise Reviewed, Physical  Activity, Assistive device use and maintanence  Physical Activity Discussed/Reviewed Physical Activity Discussed, Physical Activity Reviewed, Types of exercise, Home Exercise Program (HEP)  [Encouraged]  Education Interventions   Education Provided Provided Therapist, sports, Provided Web-based Education, Provided Education  Provided Verbal Education On Nutrition, Foot Care, Eye Care, Labs, Blood Sugar Monitoring, Mental Health/Coping with Illness, Applications, Exercise, Medication, When to see the doctor, [employer], Human resources officer, Personal Care Services  Mental Health Interventions   Mental Health Discussed/Reviewed Mental Health Discussed, Mental Health Reviewed, Coping Strategies, Crisis, Anxiety, Depression, Grief and Loss, Substance Abuse, Suicide  Nutrition Interventions   Nutrition Discussed/Reviewed Nutrition Discussed, Nutrition Reviewed, Adding fruits and vegetables, Fluid intake, Decreasing sugar intake, Increaing proteins, Decreasing fats, Decreasing salt  Pharmacy Interventions   Pharmacy Dicussed/Reviewed Pharmacy Topics Discussed, Pharmacy Topics Reviewed, Medication Adherence, Affording Medications, Referral to Pharmacist  Medication Adherence Unable to refill medication  Referral to Pharmacist Cannot afford medications  Safety Interventions   Safety Discussed/Reviewed Safety Discussed, Safety Reviewed, Fall Risk, Home Safety  Home Safety Assistive Devices, Need for home safety assessment, Refer for home visit, Contact provider for referral to PT/OT, Refer for community resources, Contact home health agency  Advanced Directive Interventions   Advanced Directives Discussed/Reviewed Advanced Directives Discussed, Advanced Directives Reviewed, Advanced Care Planning, Provided resource for acquiring and filling out documents     Active Listening & Reflection Utilized.  Emotional Support Provided. Verbalization of Feelings Encouraged.  Feelings  of Sadness Regarding Loss of Independence Acknowledged. Symptoms of Fatigue Validated. Self-Enrollment in Caregiver Support Group of Interest Emphasized. Solution-Focused Strategies Implemented. Problem Solving Interventions Employed. Task-Centered Solutions Activated.  Cognitive Behavioral Therapy Initiated.  Client-Centered Therapy Performed. Acceptance & Commitment Therapy Indicated. CSW Collaboration with Patient & Daughter, [name] to Confirm Interest in Patient Receiving An Additional 2 Weeks (14 Precooked Frozen Meals) of [employer]  Well Producer, television/film/video [phone]). CSW Collaboration with Baruch Gouty, Care Management Assistant with [facility], to Request Approval of An Additional 2 Weeks (14 Precooked Frozen Meals) of [facility] Well [employer] [phone]). CSW Collaboration with Patient & Daughter, [name] to Report Approval of An Additional 2 Weeks (14 Precooked Frozen Meals) of [employer] Well [employer] [phone]) for Patient, Delivered Directly to The Home.   CSW Collaboration with Patient & Daughter, [name] to [employer] of [employer] [phone]), a Free Benefit to [facility] Recipients. CSW Collaboration with Patient & Daughter, [name] to Encourage Attendance at [facility], Scheduled on 08/12/2022 at 4:00 PM, at [facility] of Conni Elliot [phone]), to [employer] with Completion of Simple Janalyn Harder Powers of [address], Healthcare Powers of Attorney & Living [place]. CSW Collaboration with Daughter, [name] to Confirm Application for Aid & Attendance Benefits Almost Completed, Only Requiring Review & Signature from Patient. CSW Collaboration with Daughter, [name] to Confirm Intent to [employer] Application for Aid & Attendance Benefits to [facility] [phone]), No Later than 08/10/2022.  CSW Collaboration with Patient & Daughter, [name] to Control and instrumentation engineer with [facility] [phone]), to Schedule Appointment to Receive Assistance with Completion of Application for Aid & Attendance Benefits. CSW Collaboration with Patient & Daughter, [name] to [employer] with CSW [phone]), if She Has Questions, Needs Assistance, or If Additional Social Work Needs Are Identified Between Now & Our Next Scheduled Follow-Up Outreach Call.      Our next appointment is by telephone on 08/18/2022 at 9:30 am.  Please call the care guide team at [phone] if you need to cancel or reschedule your appointment.   If you are experiencing a Mental Health or Behavioral Health Crisis or need someone to talk to, please call the Suicide and Crisis Lifeline: 988 call the [place] National Suicide Prevention Lifeline: 438-243-0672 or TTY: 203 036 7574 TTY 825-705-5856) to talk to a trained counselor call 1-800-273-TALK (toll free, 24 hour hotline) go to [facility] [address] [place] [phone]) call the [facility] Crisis Line: [phone] call 911  Patient verbalizes understanding of instructions and care plan provided today and agrees to view in MyChart. Active MyChart status and patient understanding of how to access instructions and care plan via MyChart confirmed with patient.     Telephone follow up appointment with care management team member scheduled for:  08/18/2022 at 9:30 am.  Danford Bad, BSW, MSW, LCSW  Licensed Clinical Social Worker  [facility]  Mailing [place]. [address], [place], [place] [address] Physical Address-[address], [place], [place] [address] Toll Free Main # [phone] Fax # [phone] Cell #  [phone] [email or web address]

## 2022-07-29 ENCOUNTER — Telehealth: Payer: Self-pay

## 2022-07-29 DIAGNOSIS — N2581 Secondary hyperparathyroidism of renal origin: Secondary | ICD-10-CM | POA: Diagnosis not present

## 2022-07-29 DIAGNOSIS — N186 End stage renal disease: Secondary | ICD-10-CM | POA: Diagnosis not present

## 2022-07-29 DIAGNOSIS — R2689 Other abnormalities of gait and mobility: Secondary | ICD-10-CM | POA: Diagnosis not present

## 2022-07-29 DIAGNOSIS — I5032 Chronic diastolic (congestive) heart failure: Secondary | ICD-10-CM | POA: Diagnosis not present

## 2022-07-29 DIAGNOSIS — Z992 Dependence on renal dialysis: Secondary | ICD-10-CM | POA: Diagnosis not present

## 2022-07-29 DIAGNOSIS — Z4781 Encounter for orthopedic aftercare following surgical amputation: Secondary | ICD-10-CM | POA: Diagnosis not present

## 2022-07-29 DIAGNOSIS — M6281 Muscle weakness (generalized): Secondary | ICD-10-CM | POA: Diagnosis not present

## 2022-07-29 DIAGNOSIS — E1140 Type 2 diabetes mellitus with diabetic neuropathy, unspecified: Secondary | ICD-10-CM | POA: Diagnosis not present

## 2022-07-29 DIAGNOSIS — Z89511 Acquired absence of right leg below knee: Secondary | ICD-10-CM | POA: Diagnosis not present

## 2022-07-29 DIAGNOSIS — R296 Repeated falls: Secondary | ICD-10-CM | POA: Diagnosis not present

## 2022-07-29 NOTE — Telephone Encounter (Signed)
Renal coordinator outreached by [facility] LCSW Mardene Celeste Saporito regarding patient being discharged from SNF earlier in the month. [employer] meals processed on 06/29/22 for 4 weeks of renal friendly meals. Additional request received 07/28/22 regarding additional meals needed until patients daughter moves in to assist. Request processed for ongoing meals (14 meals monthly) due to patient on Meals On Wheels wait list.   LCSW informed to keep coordinator informed should patient want to discontinue receiving assistance.  Baruch Gouty Renal Coordinator [facility] Population Health [phone]

## 2022-07-30 ENCOUNTER — Encounter: Payer: Medicare PPO | Admitting: Orthopedic Surgery

## 2022-07-30 ENCOUNTER — Telehealth: Payer: Self-pay | Admitting: Family Medicine

## 2022-07-30 ENCOUNTER — Ambulatory Visit (INDEPENDENT_AMBULATORY_CARE_PROVIDER_SITE_OTHER): Payer: Medicare PPO | Admitting: Podiatry

## 2022-07-30 DIAGNOSIS — L97522 Non-pressure chronic ulcer of other part of left foot with fat layer exposed: Secondary | ICD-10-CM | POA: Diagnosis not present

## 2022-07-30 MED ORDER — DOXYCYCLINE HYCLATE 100 MG PO TABS
100.0000 mg | ORAL_TABLET | Freq: Two times a day (BID) | ORAL | 0 refills | Status: DC
Start: 2022-07-30 — End: 2022-08-06

## 2022-07-30 NOTE — Telephone Encounter (Signed)
Forms received will fax once signed

## 2022-07-30 NOTE — Progress Notes (Signed)
Subjective:   Patient ID: Kathryn Beck, female   DOB: 67 y.o.   MRN: [id number]   HPI Chief Complaint  Patient presents with   Wound Check    ulcer left 2nd toe in uncontrolled diabetic with recent right BKA. Patient was referred by another provider for further evaluation. Some smell noted and swelling. Swelling started about 3 weeks ago. Patietn has been applying antibiotic ointment to the area.    67 year old female presents the office with above concerns.  She states she has had a wound to the left second toe she is not sure if the toe could be saved.  She is having swelling for the last several weeks.  Some keeping antibiotic ointment on it.  She underwent left SFA popliteal stent on July 21, 2022.  She has a history of right below-knee amputation.   Review of Systems  All other systems reviewed and are negative.  Past Medical History:  Diagnosis Date   Acid reflux    Anemia    Arthritis    Axillary masses    Soft tissue - status post excision   Back pain    COVID-19 virus infection 04/06/2019   Depression    End-stage renal disease (HCC)    M/W/F dialysis   Essential hypertension    Headache    years ago   History of blood transfusion    History of cardiac catheterization    Normal coronary arteries October 2020   History of claustrophobia    History of pneumonia 2019   Hypoxia 04/03/2019   Memory loss    Mixed hyperlipidemia    Obesity    Pancreatitis    Peritoneal dialysis catheter in place [facility])    Pneumonia due to COVID-19 virus 04/02/2019   Sleep apnea    Noncompliant with CPAP   Stroke (HCC)    mini stroke   Type 2 diabetes mellitus (HCC)     Past Surgical History:  Procedure Laterality Date   ABDOMINAL AORTOGRAM W/LOWER EXTREMITY N/A 04/30/2022   Procedure: ABDOMINAL AORTOGRAM W/LOWER EXTREMITY;  Surgeon: Leonie Douglas, MD;  [facility] INVASIVE CV LAB;  Service: Cardiovascular;  Laterality: N/A;   ABDOMINAL AORTOGRAM W/LOWER EXTREMITY N/A  07/21/2022   Procedure: ABDOMINAL AORTOGRAM W/LOWER EXTREMITY;  Surgeon: Nada Libman, MD;  Location: [facility] INVASIVE CV LAB;  Service: Cardiovascular;  Laterality: N/A;   ABDOMINAL HYSTERECTOMY     AMPUTATION Right 05/29/2022   Procedure: RIGHT BELOW THE KNEE AMPUTATION;  Surgeon: [name], MD;  Location: [facility] OR;  Service: Orthopedics;  Laterality: Right;   AV FISTULA PLACEMENT Left 09/02/2017   Procedure: creation of left arm ARTERIOVENOUS (AV) FISTULA;  Surgeon: Nada Libman, MD;  Location: [facility] OR;  Service: Vascular;  Laterality: Left;   COLONOSCOPY  2008   Dr. Darrick Penna: normal    COLONOSCOPY N/A 12/18/2016   Dr. Darrick Penna: multiple tubular adenomas, internal hemorrhoids. Surveillance in 3 years    ESOPHAGEAL DILATION N/A 10/13/2015   Procedure: ESOPHAGEAL DILATION;  Surgeon: Malissa Hippo, MD;  [facility];  Service: Endoscopy;  Laterality: N/A;   ESOPHAGOGASTRODUODENOSCOPY N/A 10/13/2015   Dr. Karilyn Cota: chronic gastritis on path, no H.pylori. Empiric dilation    ESOPHAGOGASTRODUODENOSCOPY N/A 12/18/2016   Dr. Darrick Penna: mild gastritis. BRAVO study revealed uncontrolled GERD. Dysphagia secondary to uncontrolled reflux   FOOT SURGERY Bilateral    "nerve"     LEFT HEART CATH AND CORONARY ANGIOGRAPHY N/A 12/29/2018   Procedure: LEFT HEART CATH AND CORONARY  ANGIOGRAPHY;  Surgeon: Corky Crafts, MD;  Location: [facility] INVASIVE CV LAB;  Service: Cardiovascular;  Laterality: N/A;   LOWER EXTREMITY ANGIOGRAPHY Right 05/04/2022   Procedure: Lower Extremity Angiography;  Surgeon: Victorino Sparrow, MD;  Location: [facility] INVASIVE CV LAB;  Service: Cardiovascular;  Laterality: Right;   LUNG BIOPSY     MASS EXCISION Right 01/09/2013   Procedure: EXCISION OF NEOPLASM OF RIGHT  AXILLA  AND EXCISION OF NEOPLASM OF LEFT AXILLA;  Surgeon: Dalia Heading, MD;  Location: [facility] ORS;  Service: General;  Laterality: Right;  procedure end @ 08:23   MYRINGOTOMY WITH TUBE PLACEMENT Bilateral 04/28/2017   Procedure:  BILATERAL MYRINGOTOMY WITH TUBE PLACEMENT;  Surgeon: Newman Pies, MD;  Location: [facility] OR;  Service: ENT;  Laterality: Bilateral;   PERIPHERAL VASCULAR BALLOON ANGIOPLASTY Right 05/04/2022   Procedure: PERIPHERAL VASCULAR BALLOON ANGIOPLASTY;  Surgeon: Victorino Sparrow, MD;  Location: [facility] INVASIVE CV LAB;  Service: Cardiovascular;  Laterality: Right;  PT   PERIPHERAL VASCULAR INTERVENTION Right 05/04/2022   Procedure: PERIPHERAL VASCULAR INTERVENTION;  Surgeon: Victorino Sparrow, MD;  Location: [facility] INVASIVE CV LAB;  Service: Cardiovascular;  Laterality: Right;  SFA   PERIPHERAL VASCULAR INTERVENTION Left 07/21/2022   Procedure: PERIPHERAL VASCULAR INTERVENTION;  Surgeon: Nada Libman, MD;  [facility];  Service: Cardiovascular;  Laterality: Left;   REVISION OF ARTERIOVENOUS GORETEX GRAFT Left 05/04/2018   Procedure: TRANSPOSITION OF CEPHALIC VEIN ARTERIOVENOUS FISTULA LEFT ARM;  Surgeon: Larina Earthly, MD;  Location: [facility] OR;  Service: Vascular;  Laterality: Left;   SAVORY DILATION N/A 12/18/2016   Procedure: SAVORY DILATION;  Surgeon: West Bali, MD;  [facility];  Service: Endoscopy;  Laterality: N/A;     Current Outpatient Medications:    doxycycline (VIBRA-TABS) 100 MG tablet, Take 1 tablet (100 mg total) by mouth 2 (two) times daily., Disp: 20 tablet, Rfl: 0   aspirin 81 MG chewable tablet, CHEW TWO TABLETS BY MOUTH EVERY DAY, Disp: 56 tablet, Rfl: 11   azelastine (OPTIVAR) 0.05 % ophthalmic solution, Place 1 drop into both eyes 2 (two) times daily., Disp: 6 mL, Rfl: 2   B Complex-C-Folic Acid (RENA-VITE RX) 1 MG TABS, Take 1 tablet by mouth daily., Disp: , Rfl:    Blood Glucose Monitoring Suppl (ONETOUCH VERIO) w/Device KIT, Use to check blood sugar 4X daily., Disp: 1 kit, Rfl: 0   calcitRIOL (ROCALTROL) 0.25 MCG capsule, TAKE 1 CAPSULE (0.25 MCG TOTAL) BY MOUTH 2 (TWO) TIMES DAILY WITH A MEAL., Disp: 180 capsule, Rfl: 1   clopidogrel (PLAVIX) 75 MG tablet, Take 1  tablet (75 mg total) by mouth daily with breakfast., Disp: 30 tablet, Rfl: 2   Continuous Blood Gluc Receiver (FREESTYLE LIBRE 2 READER) DEVI, 1 each by Does not apply route daily., Disp: 1 each, Rfl: 3   Continuous Blood Gluc Sensor (FREESTYLE LIBRE 2 SENSOR) MISC, 1 each by Does not apply route every 14 (fourteen) days., Disp: 6 each, Rfl: 3   DULoxetine (CYMBALTA) 60 MG capsule, TAKE (1) CAPSULE BY MOUTH ONCE DAILY. (Patient taking differently: Take 60 mg by mouth daily.), Disp: 28 capsule, Rfl: 11   ezetimibe (ZETIA) 10 MG tablet, Take 1 tablet (10 mg total) by mouth daily., Disp: 28 tablet, Rfl: 11   furosemide (LASIX) 80 MG tablet, Take 80 mg by mouth 2 (two) times daily., Disp: , Rfl:    glucose blood (ONETOUCH VERIO) test strip, Use as instructed to check blood sugar 4X daily.,  Disp: 200 each, Rfl: 2   hydrOXYzine (ATARAX) 25 MG tablet, Take 25 mg by mouth every 12 (twelve) hours as needed for itching., Disp: , Rfl:    insulin glargine, 2 Unit Dial, (TOUJEO MAX SOLOSTAR) 300 UNIT/ML Solostar Pen, Inject 30 Units into the skin daily. May need to slowly increase the dose depending upon your blood sugar, follow-up with PCP, Disp: 1 mL, Rfl: 0   Insulin Pen Needle 32G X 4 MM MISC, Use 4x a day, Disp: 300 each, Rfl: 3   metoprolol succinate (TOPROL-XL) 50 MG 24 hr tablet, TAKE (1) TABLET BY MOUTH DAILY WITH FOOD *TAKE AFTER DIALYSIS* (Patient taking differently: Take 50 mg by mouth daily.), Disp: 28 tablet, Rfl: 11   OneTouch Delica Lancets 33G MISC, Use to check blood sugar 4X daily., Disp: 100 each, Rfl: 2   oxyCODONE-acetaminophen (PERCOCET/ROXICET) 5-325 MG tablet, Take 1 tablet by mouth every 4 (four) hours as needed., Disp: 30 tablet, Rfl: 0   pregabalin (LYRICA) 50 MG capsule, Take 1 capsule (50 mg total) by mouth 3 (three) times daily., Disp: 90 capsule, Rfl: 2   sertraline (ZOLOFT) 50 MG tablet, TAKE ONE TABLET BY MOUTH EVERY DAY, Disp: 28 tablet, Rfl: 0   [name] 500 MG chewable tablet,  Chew 500-1,000 mg by mouth See admin instructions. Take 1000mg  (2 tablets) by mouth with meals and 500mg  (1 tablet) with snacks, Disp: , Rfl:  No current facility-administered medications for this visit.  Facility-Administered Medications Ordered in Other Visits:    0.9 %  sodium chloride infusion, , Intravenous, Continuous, Lockamy, Randi L, NP-C, Last Rate: 20 mL/hr at 05/29/22 0928, New Bag at 05/29/22 1013  Allergies  Allergen Reactions   Ace Inhibitors Anaphylaxis and Swelling   Penicillins Itching, Swelling and Other (See Comments)    Did it involve swelling of the face/tongue/throat, SOB, or low BP? Unknown Did it involve sudden or severe rash/hives, skin peeling, or any reaction on the inside of your mouth or nose? Unknown Did you need to seek medical attention at a hospital or doctor's office? Unknown When did it last happen?      years  If all above answers are "NO", may proceed with cephalosporin use.    Statins Other (See Comments)    elevated LFT's     Albuterol Swelling          Objective:  Physical Exam  General: AAO x3, NAD-husband present, daughter on phone  Dermatological: Hyperpigmented changes present at the distal aspect of the left second toe there is starting of an eschar present.  There is no drainage or pus.  There is superficial wounds present on the medial aspect of the hallux, fifth toe as well without any probing, undermining or tunneling.  There is localized edema.  Some serosanguineous drainage expressed.  There is no fluctuance or crepitation there is no malodor.  Vascular: Foot is warm and well-perfused.  Neruologic: Sensation decreased  Musculoskeletal: No pain on exam     Assessment:   Gangrenous changes left 2nd toe, ulcerations to hallux, fifth toe     Plan:  -Treatment options discussed including all alternatives, risks, and complications -Etiology of symptoms were discussed -X-rays were obtained and reviewed with the patient.   Subacute fractures noted metatarsal necks.  Osteopenia present.  No definitive cortical destruction suggest osteomyelitis. -I did clean the areas today and after cleaning the wounds look much better.  I do think at least she is can need a second toe amputation however she  has ulceration of the other toes.  She may end up in the transmetatarsal amputation.  For now we will continue with local wound care and see if the other wounds continue to heal or if they worsen she will need to proceed with amputation.  Discussed with her she is to keep a very close monitoring of his wounds of the kidney given worse very quickly and at this point occur she is to report to emergency room.  Prescribed doxycycline.  Offloading.  Continue with daily dressing change with Betadine which was applied today.  Vivi Barrack DPM     Daugher on phone

## 2022-07-30 NOTE — Telephone Encounter (Signed)
Kathryn Beck called from [facility] looking  for home health orders says it is 3 of them. Order # [id number], order # [id number]   and order # [id number]  Kathryn Beck Call back # [phone]

## 2022-07-31 ENCOUNTER — Telehealth: Payer: Self-pay | Admitting: Family Medicine

## 2022-07-31 DIAGNOSIS — Z992 Dependence on renal dialysis: Secondary | ICD-10-CM | POA: Diagnosis not present

## 2022-07-31 DIAGNOSIS — N186 End stage renal disease: Secondary | ICD-10-CM | POA: Diagnosis not present

## 2022-07-31 DIAGNOSIS — N2581 Secondary hyperparathyroidism of renal origin: Secondary | ICD-10-CM | POA: Diagnosis not present

## 2022-07-31 NOTE — Telephone Encounter (Signed)
FYI to let Dr Lodema Hong know the patient is doing well from the hospital discharge date and reason for reschedule for the [facility]  there are too many other appointments and felt like they were more important asked to reschedule to June.

## 2022-07-31 NOTE — Telephone Encounter (Signed)
Called patient to schedule Medicare Annual Wellness Visit (AWV). Left message for patient to call back and schedule Medicare Annual Wellness Visit (AWV).  Last date of AWV: 05/08/2021   Please schedule an appointment at any time with Abby, NHA. .  If any questions, please contact me at [phone].  Thank you,  Judeth Cornfield,  AMB Clinical Support [facility] AWV Program Direct Dial ??[phone]

## 2022-08-03 DIAGNOSIS — Z992 Dependence on renal dialysis: Secondary | ICD-10-CM | POA: Diagnosis not present

## 2022-08-03 DIAGNOSIS — N186 End stage renal disease: Secondary | ICD-10-CM | POA: Diagnosis not present

## 2022-08-03 DIAGNOSIS — N2581 Secondary hyperparathyroidism of renal origin: Secondary | ICD-10-CM | POA: Diagnosis not present

## 2022-08-04 ENCOUNTER — Telehealth: Payer: Self-pay | Admitting: Neurology

## 2022-08-04 ENCOUNTER — Ambulatory Visit: Payer: Medicare PPO | Admitting: Family Medicine

## 2022-08-04 ENCOUNTER — Inpatient Hospital Stay: Payer: Medicare PPO | Admitting: Internal Medicine

## 2022-08-04 ENCOUNTER — Ambulatory Visit (INDEPENDENT_AMBULATORY_CARE_PROVIDER_SITE_OTHER): Payer: Medicare PPO | Admitting: Neurology

## 2022-08-04 ENCOUNTER — Encounter (HOSPITAL_COMMUNITY): Payer: Self-pay | Admitting: Hematology

## 2022-08-04 ENCOUNTER — Encounter: Payer: Self-pay | Admitting: Neurology

## 2022-08-04 VITALS — BP 135/74 | HR 81 | Resp 15 | Wt 179.9 lb

## 2022-08-04 DIAGNOSIS — G3184 Mild cognitive impairment, so stated: Secondary | ICD-10-CM | POA: Diagnosis not present
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
## 2022-08-04 NOTE — Telephone Encounter (Signed)
Kathryn Beck: [id number] exp. 08/04/22-09/03/22, [facility] NPR Case Number: [id number] sent to GI [phone]

## 2022-08-04 NOTE — Patient Instructions (Signed)
ATN Profile to look for Alzheimer disease biomarker's MRI Brain without contrast  Continue current medications  Follow up in a year or sooner if worse    There are well-accepted and sensible ways to reduce risk for Alzheimers disease and other degenerative brain disorders .  Exercise Daily Walk A daily 20 minute walk should be part of your routine. Disease related apathy can be a significant roadblock to exercise and the only way to overcome this is to make it a daily routine and perhaps have a reward at the end (something your loved one loves to eat or drink perhaps) or a personal trainer coming to the home can also be very useful. Most importantly, the patient is much more likely to exercise if the caregiver / spouse does it with him/her. In general a structured, repetitive schedule is best.  General Health: Any diseases which effect your body will effect your brain such as a pneumonia, urinary infection, blood clot, heart attack or stroke. Keep contact with your primary care doctor for regular follow ups.  Sleep. A good nights sleep is healthy for the brain. Seven hours is recommended. If you have insomnia or poor sleep habits we can give you some instructions. If you have sleep apnea wear your mask.  Diet: Eating a heart healthy diet is also a good idea; fish and poultry instead of red meat, nuts (mostly non-peanuts), vegetables, fruits, olive oil or canola oil (instead of butter), minimal salt (use other spices to flavor foods), whole grain rice, bread, cereal and pasta and wine in moderation.Research is now showing that the MIND diet, which is a combination of The Mediterranean diet and the DASH diet, is beneficial for cognitive processing and longevity. Information about this diet can be found in The MIND Diet, a book by Alonna Minium, MS, RDN, and online at [employer]  Finances, Power of [address] and Advance Directives: You should consider putting legal  safeguards in place with regard to financial and medical decision making. While the spouse always has power of attorney for medical and financial issues in the absence of any form, you should consider what you want in case the spouse / caregiver is no longer around or capable of making decisions.

## 2022-08-05 DIAGNOSIS — N186 End stage renal disease: Secondary | ICD-10-CM | POA: Diagnosis not present

## 2022-08-05 DIAGNOSIS — Z992 Dependence on renal dialysis: Secondary | ICD-10-CM | POA: Diagnosis not present

## 2022-08-05 DIAGNOSIS — N2581 Secondary hyperparathyroidism of renal origin: Secondary | ICD-10-CM | POA: Diagnosis not present

## 2022-08-06 ENCOUNTER — Ambulatory Visit (INDEPENDENT_AMBULATORY_CARE_PROVIDER_SITE_OTHER): Payer: Medicare PPO

## 2022-08-06 ENCOUNTER — Ambulatory Visit (INDEPENDENT_AMBULATORY_CARE_PROVIDER_SITE_OTHER): Payer: Medicare PPO | Admitting: Podiatry

## 2022-08-06 ENCOUNTER — Ambulatory Visit: Payer: Medicare PPO | Admitting: Family Medicine

## 2022-08-06 ENCOUNTER — Encounter: Payer: Medicare PPO | Admitting: Orthopedic Surgery

## 2022-08-06 DIAGNOSIS — L97522 Non-pressure chronic ulcer of other part of left foot with fat layer exposed: Secondary | ICD-10-CM

## 2022-08-06 DIAGNOSIS — M86172 Other acute osteomyelitis, left ankle and foot: Secondary | ICD-10-CM | POA: Diagnosis not present

## 2022-08-06 LAB — ATN PROFILE

## 2022-08-06 MED ORDER — DOXYCYCLINE HYCLATE 100 MG PO TABS: 100.0000 mg | ORAL_TABLET | Freq: Two times a day (BID) | ORAL | 0 refills | Status: DC

## 2022-08-07 DIAGNOSIS — Z992 Dependence on renal dialysis: Secondary | ICD-10-CM | POA: Diagnosis not present

## 2022-08-07 DIAGNOSIS — N2581 Secondary hyperparathyroidism of renal origin: Secondary | ICD-10-CM | POA: Diagnosis not present

## 2022-08-07 DIAGNOSIS — N186 End stage renal disease: Secondary | ICD-10-CM | POA: Diagnosis not present

## 2022-08-08 LAB — ATN PROFILE
A -- Beta-amyloid 42/40 Ratio: 0.111 (ref >0.102)
Beta-amyloid 40: 353.22 pg/mL
Beta-amyloid 42: 39.3 pg/mL
N -- NfL, Plasma: 73.2 pg/mL — ABNORMAL HIGH (ref 0.00–4.61)

## 2022-08-10 DIAGNOSIS — Z992 Dependence on renal dialysis: Secondary | ICD-10-CM | POA: Diagnosis not present

## 2022-08-10 DIAGNOSIS — N186 End stage renal disease: Secondary | ICD-10-CM | POA: Diagnosis not present

## 2022-08-10 DIAGNOSIS — N2581 Secondary hyperparathyroidism of renal origin: Secondary | ICD-10-CM | POA: Diagnosis not present

## 2022-08-11 ENCOUNTER — Encounter: Payer: Self-pay | Admitting: Internal Medicine

## 2022-08-11 ENCOUNTER — Other Ambulatory Visit (HOSPITAL_COMMUNITY): Payer: Self-pay

## 2022-08-11 ENCOUNTER — Encounter (HOSPITAL_BASED_OUTPATIENT_CLINIC_OR_DEPARTMENT_OTHER): Payer: Medicare PPO | Attending: General Surgery | Admitting: General Surgery

## 2022-08-11 ENCOUNTER — Ambulatory Visit (INDEPENDENT_AMBULATORY_CARE_PROVIDER_SITE_OTHER): Payer: Medicare PPO | Admitting: Internal Medicine

## 2022-08-11 VITALS — BP 136/74 | HR 45

## 2022-08-11 DIAGNOSIS — Z7401 Bed confinement status: Secondary | ICD-10-CM | POA: Diagnosis not present

## 2022-08-11 DIAGNOSIS — I132 Hypertensive heart and chronic kidney disease with heart failure and with stage 5 chronic kidney disease, or end stage renal disease: Secondary | ICD-10-CM | POA: Diagnosis not present

## 2022-08-11 DIAGNOSIS — Z8616 Personal history of COVID-19: Secondary | ICD-10-CM | POA: Diagnosis not present

## 2022-08-11 DIAGNOSIS — M86072 Acute hematogenous osteomyelitis, left ankle and foot: Secondary | ICD-10-CM | POA: Diagnosis not present

## 2022-08-11 DIAGNOSIS — N250 Renal osteodystrophy: Secondary | ICD-10-CM | POA: Diagnosis not present

## 2022-08-11 DIAGNOSIS — L02612 Cutaneous abscess of left foot: Secondary | ICD-10-CM | POA: Diagnosis not present

## 2022-08-11 DIAGNOSIS — Z992 Dependence on renal dialysis: Secondary | ICD-10-CM | POA: Diagnosis not present

## 2022-08-11 DIAGNOSIS — I96 Gangrene, not elsewhere classified: Secondary | ICD-10-CM | POA: Diagnosis not present

## 2022-08-11 DIAGNOSIS — Z6832 Body mass index (BMI) 32.0-32.9, adult: Secondary | ICD-10-CM | POA: Diagnosis not present

## 2022-08-11 DIAGNOSIS — E1152 Type 2 diabetes mellitus with diabetic peripheral angiopathy with gangrene: Secondary | ICD-10-CM | POA: Diagnosis present

## 2022-08-11 DIAGNOSIS — M86172 Other acute osteomyelitis, left ankle and foot: Secondary | ICD-10-CM | POA: Diagnosis present

## 2022-08-11 DIAGNOSIS — R1310 Dysphagia, unspecified: Secondary | ICD-10-CM | POA: Diagnosis not present

## 2022-08-11 DIAGNOSIS — I120 Hypertensive chronic kidney disease with stage 5 chronic kidney disease or end stage renal disease: Secondary | ICD-10-CM | POA: Diagnosis present

## 2022-08-11 DIAGNOSIS — I739 Peripheral vascular disease, unspecified: Secondary | ICD-10-CM | POA: Diagnosis not present

## 2022-08-11 DIAGNOSIS — E782 Mixed hyperlipidemia: Secondary | ICD-10-CM

## 2022-08-11 DIAGNOSIS — Z880 Allergy status to penicillin: Secondary | ICD-10-CM | POA: Diagnosis not present

## 2022-08-11 DIAGNOSIS — D3500 Benign neoplasm of unspecified adrenal gland: Secondary | ICD-10-CM | POA: Diagnosis not present

## 2022-08-11 DIAGNOSIS — N186 End stage renal disease: Secondary | ICD-10-CM | POA: Diagnosis present

## 2022-08-11 DIAGNOSIS — E11621 Type 2 diabetes mellitus with foot ulcer: Secondary | ICD-10-CM | POA: Diagnosis present

## 2022-08-11 DIAGNOSIS — E1151 Type 2 diabetes mellitus with diabetic peripheral angiopathy without gangrene: Secondary | ICD-10-CM | POA: Diagnosis not present

## 2022-08-11 DIAGNOSIS — M6281 Muscle weakness (generalized): Secondary | ICD-10-CM | POA: Diagnosis not present

## 2022-08-11 DIAGNOSIS — Z794 Long term (current) use of insulin: Secondary | ICD-10-CM

## 2022-08-11 DIAGNOSIS — I10 Essential (primary) hypertension: Secondary | ICD-10-CM | POA: Diagnosis not present

## 2022-08-11 DIAGNOSIS — Z4781 Encounter for orthopedic aftercare following surgical amputation: Secondary | ICD-10-CM | POA: Diagnosis not present

## 2022-08-11 DIAGNOSIS — Z8673 Personal history of transient ischemic attack (TIA), and cerebral infarction without residual deficits: Secondary | ICD-10-CM | POA: Diagnosis not present

## 2022-08-11 DIAGNOSIS — G894 Chronic pain syndrome: Secondary | ICD-10-CM | POA: Diagnosis present

## 2022-08-11 DIAGNOSIS — S99922A Unspecified injury of left foot, initial encounter: Secondary | ICD-10-CM | POA: Diagnosis not present

## 2022-08-11 DIAGNOSIS — N2581 Secondary hyperparathyroidism of renal origin: Secondary | ICD-10-CM | POA: Diagnosis present

## 2022-08-11 DIAGNOSIS — R7989 Other specified abnormal findings of blood chemistry: Secondary | ICD-10-CM

## 2022-08-11 DIAGNOSIS — Z89422 Acquired absence of other left toe(s): Secondary | ICD-10-CM | POA: Diagnosis not present

## 2022-08-11 DIAGNOSIS — E1122 Type 2 diabetes mellitus with diabetic chronic kidney disease: Secondary | ICD-10-CM

## 2022-08-11 DIAGNOSIS — M86672 Other chronic osteomyelitis, left ankle and foot: Secondary | ICD-10-CM | POA: Diagnosis not present

## 2022-08-11 DIAGNOSIS — R238 Other skin changes: Secondary | ICD-10-CM | POA: Diagnosis not present

## 2022-08-11 DIAGNOSIS — M8610 Other acute osteomyelitis, unspecified site: Secondary | ICD-10-CM | POA: Diagnosis present

## 2022-08-11 DIAGNOSIS — R41841 Cognitive communication deficit: Secondary | ICD-10-CM | POA: Diagnosis not present

## 2022-08-11 DIAGNOSIS — M869 Osteomyelitis, unspecified: Secondary | ICD-10-CM | POA: Diagnosis not present

## 2022-08-11 DIAGNOSIS — Z89412 Acquired absence of left great toe: Secondary | ICD-10-CM | POA: Diagnosis not present

## 2022-08-11 DIAGNOSIS — M24572 Contracture, left ankle: Secondary | ICD-10-CM | POA: Diagnosis not present

## 2022-08-11 DIAGNOSIS — I5032 Chronic diastolic (congestive) heart failure: Secondary | ICD-10-CM | POA: Diagnosis not present

## 2022-08-11 DIAGNOSIS — R234 Changes in skin texture: Secondary | ICD-10-CM | POA: Diagnosis not present

## 2022-08-11 DIAGNOSIS — E119 Type 2 diabetes mellitus without complications: Secondary | ICD-10-CM

## 2022-08-11 DIAGNOSIS — L97521 Non-pressure chronic ulcer of other part of left foot limited to breakdown of skin: Secondary | ICD-10-CM | POA: Diagnosis not present

## 2022-08-11 DIAGNOSIS — Z888 Allergy status to other drugs, medicaments and biological substances status: Secondary | ICD-10-CM | POA: Diagnosis not present

## 2022-08-11 DIAGNOSIS — Z89511 Acquired absence of right leg below knee: Secondary | ICD-10-CM | POA: Diagnosis not present

## 2022-08-11 DIAGNOSIS — I509 Heart failure, unspecified: Secondary | ICD-10-CM | POA: Diagnosis not present

## 2022-08-11 DIAGNOSIS — L97522 Non-pressure chronic ulcer of other part of left foot with fat layer exposed: Secondary | ICD-10-CM | POA: Diagnosis not present

## 2022-08-11 DIAGNOSIS — R2689 Other abnormalities of gait and mobility: Secondary | ICD-10-CM | POA: Diagnosis not present

## 2022-08-11 DIAGNOSIS — D631 Anemia in chronic kidney disease: Secondary | ICD-10-CM | POA: Diagnosis present

## 2022-08-11 DIAGNOSIS — D352 Benign neoplasm of pituitary gland: Secondary | ICD-10-CM

## 2022-08-11 DIAGNOSIS — M7989 Other specified soft tissue disorders: Secondary | ICD-10-CM | POA: Diagnosis not present

## 2022-08-11 DIAGNOSIS — F32A Depression, unspecified: Secondary | ICD-10-CM | POA: Diagnosis present

## 2022-08-11 DIAGNOSIS — B999 Unspecified infectious disease: Secondary | ICD-10-CM | POA: Diagnosis not present

## 2022-08-11 DIAGNOSIS — E1165 Type 2 diabetes mellitus with hyperglycemia: Secondary | ICD-10-CM | POA: Diagnosis not present

## 2022-08-11 DIAGNOSIS — L97529 Non-pressure chronic ulcer of other part of left foot with unspecified severity: Secondary | ICD-10-CM | POA: Diagnosis present

## 2022-08-11 DIAGNOSIS — L97526 Non-pressure chronic ulcer of other part of left foot with bone involvement without evidence of necrosis: Secondary | ICD-10-CM | POA: Diagnosis not present

## 2022-08-11 DIAGNOSIS — E1169 Type 2 diabetes mellitus with other specified complication: Secondary | ICD-10-CM | POA: Diagnosis present

## 2022-08-11 DIAGNOSIS — Z95820 Peripheral vascular angioplasty status with implants and grafts: Secondary | ICD-10-CM | POA: Diagnosis not present

## 2022-08-11 NOTE — Patient Instructions (Addendum)
Please decrease: - Toujeo 30 units daily - Fiasp: up to 10 min before a meal:  -  10 - 20 units before ,meals Use the following FiAsp Sliding scale: - 150-175: + 1 unit  - 176-200: + 2 units  - 201-225: + 3 units  - 226-250: + 4 units  - 251-275: + 5 units - 276-300: + 6 units   Please return in 3-4 months.

## 2022-08-11 NOTE — Progress Notes (Signed)
Patient ID: KATALIYAH PRINZO, female   DOB: 17-Jul-1955, 67 y.o.   MRN: [id number]  HPI: EMELYNN JOURDAN is a 67 y.o.-year-old female, initially referred by her PCP, Dr. Lodema Hong, returning for follow-up for of DM2, dx in 2008, insulin-dependent since ~2015, uncontrolled, with complications (ESRD, PN). She saw Dr. Fransico Him - last OV with him 05/2016.  Last visit with me 4 months ago. She is here with her daughter, granddaughter, and dog.  History is obtained from patient along with daughter and granddaughter.  Interim history: She continues hemodialysis, previously on peritoneal dialysis.  She was previously on the kidney transplant but not anymore as she needs better control of hypertension and diabetes. She denies increased urination, blurry vision, nausea, chest pain.  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 She now has a L foot ulcer and sees Dr. Ardelle Anton.  Adrenal masses.  Reviewed previous imaging reports: 08/23/2008: MRI of the abdomen with and without contrast: Left adrenal mass measuring 1.6 x 1.5 cm (previously measuring 1.4 x 1.6 cm on the CT scan from 2003 -stable in size),  Of note, she has an enlarging pancreatic head mass - pt reports a h/o pancreatitis.   10/10/2015: CT abdomen and pelvis without contrast: Stable 2.2 cm left adrenal nodule with Hounsfield unit measurements 28 unchanged likely a lipid poor adenoma  05/28/2017: MRI abd: Bilateral adrenal nodules with signal dropout on out of phase imaging, consistent with adenomas.   Example at 2.5 cm on the left and on the order of 1.6 cm on the right.  04/22/2018: MRI abd.: Stable 19 mm nodule in the left adrenal gland, unchanged since 2015, 19 mm otherwise negative.  Reviewed previous adrenal biochemical investigation: Dexamethasone suppression test showed a low normal, nonsuppressed cortisol, but I suspect that this is due to her end-stage renal disease and dialysis status, rather than increased  production. Component     Latest Ref Rng & Units 08/31/2018  Cortisol - AM     mcg/dL 6.1  Dexamethasone, Serum     ng/dL 829   A 56-OZHY urine cortisol could not be done - on dialysis.  We checked a late-night salivary cortisol as this was elevated: Component     Latest Ref Rng & Units 12/26/2020 Draw date/time: 12/22/20  midnight   Salivary Cortisol Baseline     ug/dL 8.657  Salivary Cortisol 2nd Specimen     ug/dL Reference Range:  Children and Adults:  8:00a.m.:   0.025 - 0.600  Noon:      <0.010 - 0.330  4:00p.m.:   0.010 - 0.200  Midnight:  <0.010 - 0.090  0.634   Previous investigation was negative for hyperaldosteronism or pheochromocytoma: Component     Latest Ref Rng 10/28/2021  Potassium     3.5 - 5.1 mEq/L 3.7   Metanephrine, Pl     <=57 pg/mL <25   Normetanephrine, Pl     <=148 pg/mL 74   Total Metanephrines-Plasma     <=205 pg/mL 74   Epinephrine     pg/mL 31   Norepinephrine     pg/mL 736   Dopamine     pg/mL <10   ALDOSTERONE CANCELED   Renin Activity     0.25 - 5.82 ng/mL/h 0.46   ALDO / PRA Ratio CANCELED   HbA1c, POC (controlled diabetic range)     0.0 - 7.0 %   Total Catecholamines     pg/mL 767    Component  Latest Ref Rng & Units 11/30/2017 10/04/2018 11/21/2020  Epinephrine     pg/mL Undetectable 24 <20  Norepinephrine     pg/mL 99 167 (L) 332  Dopamine     pg/mL Undetectable <10 <10  Catecholamines, Total     pg/mL 99    Total Catecholamines     pg/mL  191 (L) 332  Metanephrine, Pl     <=57 pg/mL <25 <25 <25  Normetanephrine, Pl     <=148 pg/mL 71 38 56  Total Metanephrines-Plasma     <=205 pg/mL 71 38 56  ALDOSTERONE      ng/dL 3 2 <1  Renin Activity     0.25 - 5.82 ng/mL/h 1.20 1.48 0.28  ALDO / PRA Ratio      2.5 1.4 see note  Potassium     3.5 - 5.1 mEq/L 4.0 4.9 4.1   Since last visit, we tried to recheck her salivary cortisol but this was canceled by the lab.  After this, we rechecked a cortisol and ACTH and they  returned elevated, with a normal DHEA-S:  Component     Latest Ref Rng 07/22/2021  Salivary Cortisol Baseline     ug/dL CANCELED    Cortisol, Plasma ug/dL 16.1   Comment: AM:  4.3 - 22.4 ug/dLPM:  3.1 - 16.7 ug/dL  Resulting Agency  [facility]     C206 ACTH 6 - 50 pg/mL 85 High    Comment: .  Reference range applies only to specimens collected  between 7am-10am.  .   Resulting Agency  [facility]ICH[facility]   DHEA-Sulfate, LCMS ug/dL 91   Comment: This test was developed and its performance characteristics  determined by [facility]. It has not been cleared or approved  by the Food and Drug Administration.  Reference Range:  Adult Females (61 - 70y): <128   Resulting Agency  [facility]   At that point, I referred the patient for a pituitary MRI:  Pituitary MRI (10/02/2021): showed a microadenoma: Brain: Limited by motion degradation which is pervasive. Small pituitary gland with partially empty sella. There is a hypoenhancing nodule in the right aspect of the pituitary/sella measuring up to 3 mm on coronal postcontrast imaging, see series 26. Increased thickness along the right dorsum sella is ossification by 2019 CT. No infundibular or cavernous sinus disease.   IMPRESSION: 1. 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 Significant motion artifact. 2. Mild chronic small vessel disease.  At our visit from 10/2021, pituitary labs were at goal (with the exception of a slightly low free T3): Component     Latest Ref Rng 10/28/2021  IGF-I, LC/MS     41 - 279 ng/mL 141   Z-Score (Female)     -2.0 - 2.0 SD 0.4   Triiodothyronine,Free,Serum     2.3 - 4.2 pg/mL 2.1 (L)   T4,Free(Direct)     0.60 - 1.60 ng/dL 0.96   Growth Hormone     < OR = 7.1 ng/mL 0.1   Prolactin     ng/mL 11.4   TSH     0.35 - 5.50 uIU/mL 0.91     Due to the possibility of Cushing's disease, I referred her to [facility].  She did not have this appointment yet, as it was  rescheduled.  DM2: Reviewed HbA1c levels: Lab Results  Component Value Date   HGBA1C 6.3 (H) 07/07/2022   HGBA1C 9.3 (A) 04/16/2022   HGBA1C 8.9 (A) 10/28/2021  11/01/2020: HbA1c 9.3% 02/09/2020:  HbA1c 6.8%  In the past, pt. was on a regimen of: - Humalog 75/25 20-26 units before each meal, 2-3X a day depending on the CBG before the meal  - Levemir 100 units at bedtime  At last visit she was on: - Toujeo 54 >>  was only taking 34 units in the evening -but out for 2 months - Humalog >> on NovoLog 70/30 24-30 units 3-4x a day (!!!????) 24 units before a smaller meal 26-28 units before a regular meal 30 units before a larger meal Take Humalog EVERY TIME you eat, 15 min before the meal, if sugars are >70. - Humalog sliding scale: - 150-175: + 1 unit  - 176-200: + 2 units  - 201-225: + 3 units  - 226-250: + 4 units  - 251-275: + 5 units - 276-300: + 6 units  Try not to correct sugars at bedtime unless they are >300 and in that case, take no more than 4 units.  Currently on: - Toujeo 40 >> 60 >> 80 units in the evening >> they decreased the dose to 40 units in am - Fiasp: up to 10 min before a meal: >> off this lately - 40 units before b'fast and lunch - 30-35 units before dinner  FiAsp Sliding scale: - 141-160: + 2 units  - 161-180: + 4 units  - 181-200: + 6 units  - 201-240: + 8 units  - 241-280: + 10 units  - >281: + 12 units  Pt was checking her sugars 4x a day with the CGM:  Prev.: - am: 240-300s >> 186, 340 >> 40, 186-HI >> 220-290, 409 - 2h after b'fast: n/c  >> 297-451, 587 - lunch: 240s >> n/c >> n/c - 2h after lunch: n/c >> 519 >> n/c - dinner: 200-300s >> 486 >> 216, 469 >> 210-290 - 2h after dinner: n/c >> 385  - bedtime: 300-HI >> 135, 265, 560, 595 >> n/c >> 286 - nighttime: 197, 224 Lowest sugar was 31 ... >> 40; it is unclear at which level she has hypoglycemia awareness. Highest sugar was 519, HI >> HI.  Glucometer: Accuchek  Pt's meals are: -  Breakfast: eggs, bacon, oatmeal, grits - Lunch: salads - Dinner: meat + veggie or sandwich + salad or nabs - Snacks: nabs, fruit cups, jello  -+ ESRD, on dialysis. On irbesartan.  -+ HL; last set of lipids: Lab Results  Component Value Date   CHOL 156 07/22/2022   HDL 26 (L) 07/22/2022   LDLCALC 70 07/22/2022   LDLDIRECT 120.0 11/21/2020   TRIG 301 (H) 07/22/2022   CHOLHDL 6.0 07/22/2022  Previously on Lipitor 40, now on Zetia due to transaminitis with statins.  - last eye exam was in 07/2021: no DR.  -No numbness but she has tingling in her feet.  She is on Neurontin 300 mg 3 times a day.  Last foot exam -08/06/2022-Dr. Ardelle Anton.  She also has HTN. She has been in the hospital repeatedly in the past for pancreatitis. She also has a history of MGUS, increased ferritin, osteoporosis.  ROS: + See HPI  I reviewed pt's medications, allergies, PMH, social hx, family hx, and changes were documented in the history of present illness. Otherwise, unchanged from my initial visit note.  Past Medical History:  Diagnosis Date   Acid reflux    Anemia    Arthritis    Axillary masses    Soft tissue - status post excision   Back pain  COVID-19 virus infection 04/06/2019   Depression    End-stage renal disease (HCC)    M/W/F dialysis   Essential hypertension    Headache    years ago   History of blood transfusion    History of cardiac catheterization    Normal coronary arteries October 2020   History of claustrophobia    History of pneumonia 2019   Hypoxia 04/03/2019   Memory loss    Mixed hyperlipidemia    Obesity    Pancreatitis    Peritoneal dialysis catheter in place [facility])    Pneumonia due to COVID-19 virus 04/02/2019   Sleep apnea    Noncompliant with CPAP   Stroke (HCC)    mini stroke   Type 2 diabetes mellitus (HCC)    Past Surgical History:  Procedure Laterality Date   ABDOMINAL AORTOGRAM W/LOWER EXTREMITY N/A 04/30/2022   Procedure: ABDOMINAL AORTOGRAM W/LOWER  EXTREMITY;  Surgeon: Leonie Douglas, MD;  Location: [facility] INVASIVE CV LAB;  Service: Cardiovascular;  Laterality: N/A;   ABDOMINAL AORTOGRAM W/LOWER EXTREMITY N/A 07/21/2022   Procedure: ABDOMINAL AORTOGRAM W/LOWER EXTREMITY;  Surgeon: Nada Libman, MD;  Location: [facility] INVAS[facility] CV LAB;  Service: Cardiovascular;  Laterality: N/A;   ABDOMINAL HYSTERECTOMY     AMPUTATION Right 05/29/2022   Procedure: RIGHT BELOW THE KNEE AMPUTATION;  Surgeon: [name], MD;  Location: [facility] OR;  Service: Orthopedics;  Laterality: Right;   AV FISTULA PLACEMENT Left 09/02/2017   Procedure: creation of left arm ARTERIOVENOUS (AV) FISTULA;  Surgeon: Nada Libman, MD;  Location: [facility] OR;  Service: Vascular;  Laterality: Left;   COLONOSCOPY  2008   Dr. Darrick Penna: normal    COLONOSCOPY N/A 12/18/2016   Dr. Darrick Penna: multiple tubular adenomas, internal hemorrhoids. Surveillance in 3 years    ESOPHAGEAL DILATION N/A 10/13/2015   Procedure: ESOPHAGEAL DILATION;  Surgeon: Malissa Hippo, MD;  Location: [facility];  Service: Endoscopy;  Laterality: N/A;   ESOPHAGOGASTRODUODENOSCOPY N/A 10/13/2015   Dr. Karilyn Cota: chronic gastritis on path, no H.pylori. Empiric dilation    ESOPHAGOGASTRODUODENOSCOPY N/A 12/18/2016   Dr. Darrick Penna: mild gastritis. BRAVO study revealed uncontrolled GERD. Dysphagia secondary to uncontrolled reflux   FOOT SURGERY Bilateral    "nerve"     LEFT HEART CATH AND CORONARY ANGIOGRAPHY N/A 12/29/2018   Procedure: LEFT HEART CATH AND CORONARY ANGIOGRAPHY;  Surgeon: Corky Crafts, MD;  Location: [facility] INVASIVE CV LAB;  Service: Cardiovascular;  Laterality: N/A;   LOWER EXTREMITY ANGIOGRAPHY Right 05/04/2022   Procedure: Lower Extremity Angiography;  Surgeon: Victorino Sparrow, MD;  Location: [facility] INVASIVE CV LAB;  Service: Cardiovascular;  Laterality: Right;   LUNG BIOPSY     MASS EXCISION Right 01/09/2013   Procedure: EXCISION OF NEOPLASM OF RIGHT  AXILLA  AND EXCISION OF NEOPLASM OF LEFT AXILLA;  Surgeon: Dalia Heading, MD;  Location: [facility] ORS;  Service: General;  Laterality: Right;  procedure end @ 08:23   MYRINGOTOMY WITH TUBE PLACEMENT Bilateral 04/28/2017   Procedure: BILATERAL MYRINGOTOMY WITH TUBE PLACEMENT;  Surgeon: Newman Pies, MD;  Location: [facility] OR;  Service: ENT;  Laterality: Bilateral;   PERIPHERAL VASCULAR BALLOON ANGIOPLASTY Right 05/04/2022   Procedure: PERIPHERAL VASCULAR BALLOON ANGIOPLASTY;  Surgeon: Victorino Sparrow, MD;  Location: [facility] INVASIVE CV LAB;  Service: Cardiovascular;  Laterality: Right;  PT   PERIPHERAL VASCULAR INTERVENTION Right 05/04/2022   Procedure: PERIPHERAL VASCULAR INTERVENTION;  Surgeon: Victorino Sparrow, MD;  Location: [facility] INVASIVE CV LAB;  Service: Cardiovascular;  Laterality: Right;  SFA   PERIPHERAL VASCULAR INTERVENTION Left 07/21/2022   Procedure: PERIPHERAL VASCULAR INTERVENTION;  Surgeon: Nada Libman, MD;  Location: [facility];  Service: Cardiovascular;  Laterality: Left;   REVISION OF ARTERIOVENOUS GORETEX GRAFT Left 05/04/2018   Procedure: TRANSPOSITION OF CEPHALIC VEIN ARTERIOVENOUS FISTULA LEFT ARM;  Surgeon: Larina Earthly, MD;  Location: [facility] OR;  Service: Vascular;  Laterality: Left;   SAVORY DILATION N/A 12/18/2016   Procedure: SAVORY DILATION;  Surgeon: West Bali, MD;  Location: [facility];  Service: Endoscopy;  Laterality: N/A;   Social History   Socioeconomic History   Marital status: Married    Spouse name: Not on file   Number of children: 2  Occupational History   CNA  Tobacco Use   Smoking status: Never Smoker   Smokeless tobacco: Never Used  Substance and Sexual Activity   Alcohol use: No   Drug use: No   Current Outpatient Medications on File Prior to Visit  Medication Sig Dispense Refill   aspirin 81 MG chewable tablet CHEW TWO TABLETS BY MOUTH EVERY DAY 56 tablet 11   azelastine (OPTIVAR) 0.05 % ophthalmic solution Place 1 drop into both eyes 2 (two) times daily. 6 mL 2   B Complex-C-Folic Acid (RENA-VITE RX) 1 MG TABS  Take 1 tablet by mouth daily.     Blood Glucose Monitoring Suppl (ONETOUCH VERIO) w/Device KIT Use to check blood sugar 4X daily. 1 kit 0   calcitRIOL (ROCALTROL) 0.25 MCG capsule TAKE 1 CAPSULE (0.25 MCG TOTAL) BY MOUTH 2 (TWO) TIMES DAILY WITH A MEAL. 180 capsule 1   Continuous Blood Gluc Receiver (FREESTYLE LIBRE 2 READER) DEVI 1 each by Does not apply route daily. 1 each 3   Continuous Blood Gluc Sensor (FREESTYLE LIBRE 2 SENSOR) MISC 1 each by Does not apply route every 14 (fourteen) days. 6 each 3   doxycycline (VIBRA-TABS) 100 MG tablet Take 1 tablet (100 mg total) by mouth 2 (two) times daily. 20 tablet 0   DULoxetine (CYMBALTA) 60 MG capsule TAKE (1) CAPSULE BY MOUTH ONCE DAILY. (Patient taking differently: Take 60 mg by mouth daily.) 28 capsule 11   ezetimibe (ZETIA) 10 MG tablet Take 1 tablet (10 mg total) by mouth daily. 28 tablet 11   furosemide (LASIX) 80 MG tablet Take 80 mg by mouth 2 (two) times daily.     glucose blood (ONETOUCH VERIO) test strip Use as instructed to check blood sugar 4X daily. 200 each 2   hydrOXYzine (ATARAX) 25 MG tablet Take 25 mg by mouth every 12 (twelve) hours as needed for itching.     insulin glargine, 2 Unit Dial, (TOUJEO MAX SOLOSTAR) 300 UNIT/ML Solostar Pen Inject 30 Units into the skin daily. May need to slowly increase the dose depending upon your blood sugar, follow-up with PCP 1 mL 0   Insulin Pen Needle 32G X 4 MM MISC Use 4x a day 300 each 3   metoprolol succinate (TOPROL-XL) 50 MG 24 hr tablet TAKE (1) TABLET BY MOUTH DAILY WITH FOOD *TAKE AFTER DIALYSIS* (Patient taking differently: Take 50 mg by mouth daily.) 28 tablet 11   OneTouch Delica Lancets 33G MISC Use to check blood sugar 4X daily. 100 each 2   oxyCODONE-acetaminophen (PERCOCET/ROXICET) 5-325 MG tablet Take 1 tablet by mouth every 4 (four) hours as needed. 30 tablet 0   pregabalin (LYRICA) 50 MG capsule Take 1 capsule (50 mg total) by mouth 3 (three) times daily. 90 capsule 2  sertraline (ZOLOFT) 50 MG tablet TAKE ONE TABLET BY MOUTH EVERY DAY 28 tablet 0   [name] 500 MG chewable tablet Chew 500-1,000 mg by mouth See admin instructions. Take 1000mg  (2 tablets) by mouth with meals and 500mg  (1 tablet) with snacks     [DISCONTINUED] FLUoxetine (PROZAC) 10 MG capsule Take 10 mg by mouth daily.       [DISCONTINUED] glipiZIDE (GLUCOTROL) 10 MG tablet Take 10 mg by mouth 2 (two) times daily before a meal.       Current Facility-Administered Medications on File Prior to Visit  Medication Dose Route Frequency Provider Last Rate Last Admin   0.9 %  sodium chloride infusion   Intravenous Continuous Lockamy, Randi L, NP-C 20 mL/hr at 05/29/22 0928 New Bag at 05/29/22 1013   Allergies  Allergen Reactions   Ace Inhibitors Anaphylaxis and Swelling   Penicillins Itching, Swelling and Other (See Comments)    Did it involve swelling of the face/tongue/throat, SOB, or low BP? Unknown Did it involve sudden or severe rash/hives, skin peeling, or any reaction on the inside of your mouth or nose? Unknown Did you need to seek medical attention at a hospital or doctor's office? Unknown When did it last happen?      years  If all above answers are "NO", may proceed with cephalosporin use.    Statins Other (See Comments)    elevated LFT's     Albuterol Swelling   Family History  Problem Relation Age of Onset   Hypertension Father    Hypercholesterolemia Father    Arthritis Father    Hypertension Sister    Hypercholesterolemia Sister    Breast cancer Sister    Hypertension Sister    Colon cancer Neg Hx    Colon polyps Neg Hx    PE: BP 136/74 (BP Location: Right Arm, Patient Position: Sitting, Cuff Size: Normal)   Pulse (!) 45   SpO2 94%  Wt Readings from Last 3 Encounters:  08/04/22 179 lb 14.3 oz (81.6 kg)  07/22/22 179 lb 14.3 oz (81.6 kg)  06/10/22 175 lb 11.3 oz (79.7 kg)   Constitutional: overweight, in NAD, moon facies, truncal obesity Eyes: EOMI, no  exophthalmos ENT: no thyromegaly, no cervical lymphadenopathy Cardiovascular: RRR, No RG, +1/6 SEM, + bilateral leg swelling, pitting Respiratory: CTA B Musculoskeletal: + R BKA Skin: + dark macular rash and dry skin on her chest and neck Neurological: no tremor with outstretched hands  ASSESSMENT: 1. DM2, insulin-dependent, uncontrolled, with complications - CKD stage 4 - PN  2. HL  3. B adrenal adenomas  4.  Hypercortisolism  5. Pituitary microadenoma  PLAN:  1.  Very complex patient with longstanding, uncontrolled, type 2 diabetes, previously on premixed insulin regimen now on basal-bolus insulin regimen with high doses of insulin.  Her HbA1c initially decreased from 11.4% to 8.9% but at last visit this increased slightly to 9.3%.  At that time, we adjusted her Fiasp dose and we also gave her a sliding scale after the visit.  I also recommended the freestyle libre CGM.  She mentions that her blood sugars actually appears to be better when she was checking with her husband's glucometer.  I advised her to change her glucometer.  I also advised her to reduce the Fiasp before meals so that she did not have to get the snack at bedtime. -She had another HbA1c obtained last month and this was much better, at 6.3%, a 3% decrease! CGM interpretation: -At today's visit, patient tells  me that she was able to obtain the CGM and we reviewed her downloads: It appears that 61% of values are in target range (goal >70%), while 30% are higher than 180 (goal <25%), and 1% are lower than 70 (goal <4%).  The calculated average blood sugar is 171.  The projected HbA1c for the next 3 months (GMI) is 7.4%. -Reviewing the CGM trends, sugars appear to be fluctuating within the target range in the first part of the day, but they are increasing and they are much more variable after lunch and dinner.  They start improving overnight. -Upon questioning, patient had to reduce the dose of Toujeo since her hospital  discharge due to low blood sugars.  At last visit she was taking 80 units and they decreased it to 40 units.  On this, she occasionally has low blood sugars especially in the last week, when her daughter and granddaughter moved in to live with her for a while and she is having better meals and taking the Toujeo more consistently.  They are telling me, however, that she is not taking Fiasp before meals.  As a consequence, sugars are still mostly higher after meals, but not as high as before, when she was eating her lunch and dinner out.  We discussed about lowering the dose of Toujeo more and starting Fiasp but at much lower doses, taking between 10 units for smaller meal to 20 units for a more carb rich meal.  I also gave them a more relaxed sliding scale.  We discussed about how to use these. -It appears that her insulin resistance significantly improved after her BKA - I suggested to:  Patient Instructions  Please decrease: - Toujeo 30 units daily - Fiasp: up to 10 min before a meal:  -  10 - 20 units before ,meals Use the following FiAsp Sliding scale: - 150-175: + 1 unit  - 176-200: + 2 units  - 201-225: + 3 units  - 226-250: + 4 units  - 251-275: + 5 units - 276-300: + 6 units   Please return in 3-4 months.      - advised to check sugars at different times of the day - 4x a day, rotating check times - advised for yearly eye exams >> she is UTD - return to clinic in 3-4 months  2. HL -Reviewed latest lipid panel from 07/2022: Triglycerides high, HDL low, LDL at goal: Lab Results  Component Value Date   CHOL 156 07/22/2022   HDL 26 (L) 07/22/2022   LDLCALC 70 07/22/2022   LDLDIRECT 120.0 11/21/2020   TRIG 301 (H) 07/22/2022   CHOLHDL 6.0 07/22/2022  -She continues Zetia 10 mg daily.  She could not tolerate statins due to transaminitis  3.  Bilateral adrenal adenoma -Patient with history of bilateral adrenal adenomas per review of her abdominal MRI from 2019.  Imaging  characteristics pointed towards benign tumors, of 2.5 and 1.6 cm, respectively.  However, they have appeared to increase over time.  Reviewing the MRI of the abdomen from 2010 and the CT of the abdomen and pelvis from 2017, only 1 nodule was seen in both, in the left adrenal.  She had another MRI in 03/2018 which showed a stable left 1.9 cm adrenal nodule. -We investigated her adrenal status.  Plasma catecholamines, metanephrines, and aldosterone  were normal between 2019-2022.  Norepinephrine was slightly elevated (nonspecific) 09/2018.  Plasma catecholamines, metanephrines, aldosterone, and renin activity were all normal. -Investigation of her cortisol status is  difficult due to being on dialysis.  These patients usually have higher cortisol levels due to increased inflammation but not necessarily increased cortisol production.  However, repeated investigation for this returned positive.  These patients usually have higher cortisol level due to increased inflammation but not necessarily increased cortisol production.  However, repeated investigation for this returned positive.   4.  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  To determine between the 2, she may need to have an inferior petrosal sinus sampling test, which is not available here.  I referred her to [facility].  She did not have this appointment yet.  5.  Pituitary microadenoma -Please see above -After ACTH and cortisol returned elevated, we checked a pituitary MRI  (10/02/2021).  This revealed a possible 3 mm pituitary mass. -We checked her pituitary hormones and they were normal with exception of a slightly low free T3 -It is possible that the adenoma is the source of her cortisol production, however, she would need petrosal sinus sampling before possible surgery.  She is waiting for an appointment at [facility]. 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 -If the appointment with [facility] is further delayed, at next visit will need to repeat her pituitary MRI.  Carlus Pavlov, MD PhD [facility] [facility]

## 2022-08-12 ENCOUNTER — Encounter: Payer: Medicare PPO | Admitting: Pulmonary Disease

## 2022-08-12 ENCOUNTER — Telehealth: Payer: Self-pay | Admitting: Family Medicine

## 2022-08-12 DIAGNOSIS — N186 End stage renal disease: Secondary | ICD-10-CM | POA: Diagnosis not present

## 2022-08-12 DIAGNOSIS — N2581 Secondary hyperparathyroidism of renal origin: Secondary | ICD-10-CM | POA: Diagnosis not present

## 2022-08-12 DIAGNOSIS — Z992 Dependence on renal dialysis: Secondary | ICD-10-CM | POA: Diagnosis not present

## 2022-08-12 NOTE — Telephone Encounter (Signed)
Verbal order given  

## 2022-08-12 NOTE — Progress Notes (Signed)
Kathryn, Beck ([id number]) [phone] [id number] Page 1 of 4 Visit Report for 08/11/2022 Abuse Risk Screen Details Patient Name: Date of Service: Kathryn Beck, Kathryn Beck 08/11/2022 9:00 A M Medical Record Number: [id number] Patient Account Number: [id number] Date of Birth/Sex: Treating RN: 12/16/1955 (67 y.o. Kathryn Beck Primary Care Kathryn Beck: Syliva Overman Other Clinician: Referring Prabhleen Montemayor: Treating Lezlie Ritchey/Extender: Katheren Puller in Treatment: 0 Abuse Risk Screen Items Answer ABUSE RISK SCREEN: Has anyone close to you tried to hurt or harm you recentlyo No Do you feel uncomfortable with anyone in your familyo No Has anyone forced you do things that you didnt want to doo No Electronic Signature(s) Signed: 08/11/2022 5:06:46 PM By: Zenaida Deed RN, BSN Entered By: Zenaida Deed on 08/11/2022 09:47:00 -------------------------------------------------------------------------------- Activities of Daily Living Details Patient Name: Date of Service: Kathryn Beck, Kathryn Beck 08/11/2022 9:00 A M Medical Record Number: [id number] Patient Account Number: [id number] Date of Birth/Sex: Treating RN: 1956-03-18 (67 y.o. Kathryn Beck Primary Care Kathryn Beck: Syliva Overman Other Clinician: Referring Manas Hickling: Treating Holleigh Crihfield/Extender: Katheren Puller in Treatment: 0 Activities of Daily Living Items Answer Activities of Daily Living (Please select one for each item) Drive Automobile Not Able T Medications ake Need Assistance Use T elephone Completely Able Care for Appearance Completely Able Use T oilet Completely Able Bath / Shower Completely Able Dress Self Completely Able Feed Self Completely Able Walk Not Able Get In / Out Bed Need Assistance Housework Not Able Prepare Meals Not Able Handle Money Completely Able Shop for Self Need Assistance Electronic Signature(s) Signed: 08/11/2022 5:06:46  PM By: Zenaida Deed RN, BSN Entered By: Zenaida Deed on 08/11/2022 09:48:00 -------------------------------------------------------------------------------- Education Screening Details Patient Name: Date of Service: Kathryn Beck, Kathryn Pyo. 08/11/2022 9:00 A M Medical Record Number: [id number] Patient Account Number: [id number] Date of Birth/Sex: Treating RN: 12/14/1955 (67 y.o. Kathryn Beck Primary Care Kathryn Beck: Syliva Overman Other Clinician: Referring Regis Hinton: Treating Estela Vinal/Extender: Katheren Puller in Treatment: 0 Kathryn, Beck ([id number]) [phone].pdf Page 2 of 4 Primary Learner Assessed: Patient Learning Preferences/Education Level/Primary Language Learning Preference: Explanation, Demonstration, Printed Material Highest Education Level: College or Above Preferred Language: Economist Language Barrier: No Translator Needed: No Memory Deficit: No Emotional Barrier: No Cultural/Religious Beliefs Affecting Medical Care: No Physical Barrier Impaired Vision: Yes Glasses Impaired Hearing: No Decreased Hand dexterity: No Knowledge/Comprehension Knowledge Level: High Comprehension Level: High Ability to understand written instructions: High Ability to understand verbal instructions: High Motivation Anxiety Level: Calm Cooperation: Cooperative Education Importance: Acknowledges Need Interest in Health Problems: Asks Questions Perception: Coherent Willingness to Engage in Self-Management High Activities: Readiness to Engage in Self-Management High Activities: Electronic Signature(s) Signed: 08/11/2022 5:06:46 PM By: Zenaida Deed RN, BSN Entered By: Zenaida Deed on 08/11/2022 09:48:29 -------------------------------------------------------------------------------- Fall Risk Assessment Details Patient Name: Date of Service: Kathryn Beck, Kathryn Beck. 08/11/2022 9:00 A M Medical Record  Number: [id number] Patient Account Number: [id number] Date of Birth/Sex: Treating RN: 1956-02-28 (67 y.o. Kathryn Beck Primary Care Kathryn Beck: Syliva Overman Other Clinician: Referring Nai Borromeo: Treating Vernie Piet/Extender: Katheren Puller in Treatment: 0 Fall Risk Assessment Items Have you had 2 or more falls in the last 12 monthso 0 Yes Have you had any fall that resulted in injury in the last 12 monthso 0 Yes FALLS RISK SCREEN History of falling - immediate or within 3 months 0 No Secondary diagnosis (Do you have 2 or more medical diagnoseso) 15 Yes Ambulatory aid None/bed rest/wheelchair/nurse 0 Yes Crutches/cane/walker  0 No Furniture 0 No Intravenous therapy Access/Saline/Heparin Lock 0 No Gait/Transferring Normal/ bed rest/ wheelchair 0 Yes Weak (short steps with or without shuffle, stooped but able to lift head while walking, may seek 0 No support from furniture) Impaired (short steps with shuffle, may have difficulty arising from chair, head down, impaired 0 No balance) Mental Status Oriented to own ability 0 Yes Overestimates or forgets limitations 0 No Risk Level: Low Risk Score: 15 Kathryn Beck ([id number]) [id number] Page 3 of 4 Electronic Signature(s) -------------------------------------------------------------------------------- Foot Assessment Details Patient Name: Date of Service: Kathryn Beck, Kathryn Beck 08/11/2022 9:00 A M Medical Record Number: [id number] Patient Account Number: [id number] Date of Birth/Sex: Treating RN: [birth year] (67 y.o. Kathryn Beck Primary Care Blayke Pinera: Syliva Overman Other Clinician: Referring Brieanne Mignone: Treating Teauna Dubach/Extender: Katheren Puller in Treatment: 0 Foot Assessment Items [x]  Unable to perform right foot assessment due to amputation Site Locations + = Sensation present, - = Sensation absent, C = Callus, U = Ulcer R = Redness,  W = Warmth, M = Maceration, PU = Pre-ulcerative lesion F = Fissure, S = Swelling, D = Dryness Assessment Right: Left: Other Deformity: No Prior Foot Ulcer: No Prior Amputation: No Charcot Joint: No Ambulatory Status: Non-ambulatory Assistance Device: Wheelchair Gait: Electronic Signature(s) Signed: 08/11/2022 5:06:46 PM By: Zenaida Deed RN, BSN Entered By: Zenaida Deed on 08/11/2022 09:58:18 -------------------------------------------------------------------------------- Nutrition Risk Screening Details Patient Name: Date of Service: Kathryn Beck, Kathryn Pyo. 08/11/2022 9:00 A M Medical Record Number: [id number] Patient Account Number: [id number] Date of Birth/Sex: Treating RN: 1955/11/05 (67 y.o. Kathryn Beck Primary Care Elycia Woodside: Syliva Overman Other Clinician: Referring Willson Lipa: Treating Josede Cicero/Extender: Katheren Puller in Treatment: 0 Height (in): 64 Weight (lbs): Body Mass Index (BMI): Kathryn Beck, Kathryn Beck ([id number]) [phone] [id number] Page 4 of 4 Nutrition Risk Screening Items Score Screening NUTRITION RISK SCREEN: I have an illness or condition that made me change the kind and/or amount of food I eat 0 No I eat fewer than two meals per day 0 No I eat few fruits and vegetables, or milk products 0 No I have three or more drinks of beer, liquor or wine almost every day 0 No I have tooth or mouth problems that make it hard for me to eat 0 No I don't always have enough money to buy the food I need 0 No I eat alone most of the time 0 No I take three or more different prescribed or over-the-counter drugs a day 1 Yes Without wanting to, I have lost or gained 10 pounds in the last six months 0 No I am not always physically able to shop, cook and/or feed myself 2 Yes Nutrition Protocols Good Risk Protocol Provide education on elevated blood Moderate Risk Protocol 0 sugars and impact on wound healing, as  applicable High Risk Proctocol Risk Level: Moderate Risk Score: 3 Electronic Signature(s) Signed: 08/11/2022 5:06:46 PM By: Zenaida Deed RN, BSN Entered By: Zenaida Deed on 08/11/2022 09:50:07

## 2022-08-12 NOTE — Progress Notes (Signed)
Subjective: Chief Complaint  Patient presents with   Foot Ulcer    Rm 12 Left toe ulcer follow up. Pt states no changes just hop Dr. Ardelle Anton can save her toe. Foul smell noted with bleeding and drainage.    67 year old female presents the above concerns.  She states that has not been significant changes.  She does not report any fevers or chills.  Other than the drainage has improved since last appointment.    Objective: AAO x3, NAD Skin warm and perfused. There is sanguinous drainage described of the distal aspect of the second toe.  Granular wound present along the medial aspect of the hallux which appears to be stable.  Wound of the fifth toe is minimally improved.  There is no probing, undermining or tunneling.  There is no fluctuation or crepitation.  There is no malodor. No pain with calf compression, swelling, warmth, erythema  Assessment: Ulcerations, gangrene left foot  Plan: -All treatment options discussed with the patient including all alternatives, risks, complications.  -Again discussed treatment options with the patient.  She is at least to proceed with a second amputation but we have discussed a transmetatarsal amputation.  Overall looks a little bit better except for the second toe in which there is gangrenous changes present the distal aspect.  I still like to observe to see if the other toes improve or not.  Still concerned about her circulation status undergoing surgery.  Recently underwent stent to the left SFA and popliteal artery.  There is single-vessel runoff via the peroneal artery collaterals to the left side.  She did maximally revascularized.   -She called the wound care center next week and we will see her back in 1 week as well.  If no improvement will be proceed with surgery but she has it at risk of further limb loss. I have discussed this with the family. -Continue Betadine dressing changes for now. -Patient encouraged to call the office with any questions,  concerns, change in symptoms.   Vivi Barrack DPM

## 2022-08-12 NOTE — Telephone Encounter (Signed)
[facility] Occupational therapist w. [name] called in on patien behalf   Verbal orders for OT  1 week 3   Call back # [phone] Can leave vm w. Name and title

## 2022-08-13 ENCOUNTER — Inpatient Hospital Stay (HOSPITAL_COMMUNITY)
Admission: EM | Admit: 2022-08-13 | Discharge: 2022-08-20 | DRG: 617 | Disposition: A | Discharge status: Skilled Nursing Facility | Payer: Medicare PPO | Attending: Family Medicine | Admitting: Family Medicine

## 2022-08-13 ENCOUNTER — Ambulatory Visit (INDEPENDENT_AMBULATORY_CARE_PROVIDER_SITE_OTHER): Payer: Medicare PPO

## 2022-08-13 ENCOUNTER — Emergency Department (HOSPITAL_COMMUNITY): Payer: Medicare PPO

## 2022-08-13 ENCOUNTER — Encounter: Payer: Self-pay | Admitting: Orthopedic Surgery

## 2022-08-13 ENCOUNTER — Ambulatory Visit (INDEPENDENT_AMBULATORY_CARE_PROVIDER_SITE_OTHER): Payer: Medicare PPO | Admitting: Orthopedic Surgery

## 2022-08-13 ENCOUNTER — Encounter (HOSPITAL_COMMUNITY): Payer: Self-pay

## 2022-08-13 ENCOUNTER — Ambulatory Visit (INDEPENDENT_AMBULATORY_CARE_PROVIDER_SITE_OTHER): Payer: Medicare PPO | Admitting: Podiatry

## 2022-08-13 DIAGNOSIS — E1151 Type 2 diabetes mellitus with diabetic peripheral angiopathy without gangrene: Secondary | ICD-10-CM | POA: Diagnosis not present

## 2022-08-13 DIAGNOSIS — F32A Depression, unspecified: Secondary | ICD-10-CM | POA: Diagnosis present

## 2022-08-13 DIAGNOSIS — Z880 Allergy status to penicillin: Secondary | ICD-10-CM | POA: Diagnosis not present

## 2022-08-13 DIAGNOSIS — Z7985 Long-term (current) use of injectable non-insulin antidiabetic drugs: Secondary | ICD-10-CM

## 2022-08-13 DIAGNOSIS — M24572 Contracture, left ankle: Secondary | ICD-10-CM | POA: Diagnosis present

## 2022-08-13 DIAGNOSIS — Z8261 Family history of arthritis: Secondary | ICD-10-CM

## 2022-08-13 DIAGNOSIS — E782 Mixed hyperlipidemia: Secondary | ICD-10-CM | POA: Diagnosis present

## 2022-08-13 DIAGNOSIS — Z8249 Family history of ischemic heart disease and other diseases of the circulatory system: Secondary | ICD-10-CM

## 2022-08-13 DIAGNOSIS — I10 Essential (primary) hypertension: Secondary | ICD-10-CM

## 2022-08-13 DIAGNOSIS — Z7984 Long term (current) use of oral hypoglycemic drugs: Secondary | ICD-10-CM

## 2022-08-13 DIAGNOSIS — L97529 Non-pressure chronic ulcer of other part of left foot with unspecified severity: Secondary | ICD-10-CM | POA: Diagnosis present

## 2022-08-13 DIAGNOSIS — Z992 Dependence on renal dialysis: Secondary | ICD-10-CM

## 2022-08-13 DIAGNOSIS — M8610 Other acute osteomyelitis, unspecified site: Secondary | ICD-10-CM | POA: Diagnosis present

## 2022-08-13 DIAGNOSIS — Z90710 Acquired absence of both cervix and uterus: Secondary | ICD-10-CM

## 2022-08-13 DIAGNOSIS — D631 Anemia in chronic kidney disease: Secondary | ICD-10-CM | POA: Diagnosis present

## 2022-08-13 DIAGNOSIS — G894 Chronic pain syndrome: Secondary | ICD-10-CM | POA: Diagnosis present

## 2022-08-13 DIAGNOSIS — Z8673 Personal history of transient ischemic attack (TIA), and cerebral infarction without residual deficits: Secondary | ICD-10-CM | POA: Diagnosis not present

## 2022-08-13 DIAGNOSIS — N2581 Secondary hyperparathyroidism of renal origin: Secondary | ICD-10-CM | POA: Diagnosis present

## 2022-08-13 DIAGNOSIS — I120 Hypertensive chronic kidney disease with stage 5 chronic kidney disease or end stage renal disease: Secondary | ICD-10-CM | POA: Diagnosis present

## 2022-08-13 DIAGNOSIS — F40240 Claustrophobia: Secondary | ICD-10-CM | POA: Diagnosis present

## 2022-08-13 DIAGNOSIS — Z89422 Acquired absence of other left toe(s): Secondary | ICD-10-CM | POA: Diagnosis not present

## 2022-08-13 DIAGNOSIS — E669 Obesity, unspecified: Secondary | ICD-10-CM | POA: Diagnosis present

## 2022-08-13 DIAGNOSIS — N186 End stage renal disease: Secondary | ICD-10-CM | POA: Diagnosis not present

## 2022-08-13 DIAGNOSIS — Z89511 Acquired absence of right leg below knee: Secondary | ICD-10-CM

## 2022-08-13 DIAGNOSIS — E11621 Type 2 diabetes mellitus with foot ulcer: Secondary | ICD-10-CM | POA: Diagnosis present

## 2022-08-13 DIAGNOSIS — E1122 Type 2 diabetes mellitus with diabetic chronic kidney disease: Secondary | ICD-10-CM | POA: Diagnosis present

## 2022-08-13 DIAGNOSIS — Z888 Allergy status to other drugs, medicaments and biological substances status: Secondary | ICD-10-CM

## 2022-08-13 DIAGNOSIS — M86172 Other acute osteomyelitis, left ankle and foot: Secondary | ICD-10-CM

## 2022-08-13 DIAGNOSIS — Z7902 Long term (current) use of antithrombotics/antiplatelets: Secondary | ICD-10-CM

## 2022-08-13 DIAGNOSIS — Z8616 Personal history of COVID-19: Secondary | ICD-10-CM

## 2022-08-13 DIAGNOSIS — Z83438 Family history of other disorder of lipoprotein metabolism and other lipidemia: Secondary | ICD-10-CM

## 2022-08-13 DIAGNOSIS — Z6832 Body mass index (BMI) 32.0-32.9, adult: Secondary | ICD-10-CM | POA: Diagnosis not present

## 2022-08-13 DIAGNOSIS — E1169 Type 2 diabetes mellitus with other specified complication: Secondary | ICD-10-CM | POA: Diagnosis not present

## 2022-08-13 DIAGNOSIS — Z794 Long term (current) use of insulin: Secondary | ICD-10-CM | POA: Diagnosis not present

## 2022-08-13 DIAGNOSIS — R2689 Other abnormalities of gait and mobility: Secondary | ICD-10-CM | POA: Diagnosis not present

## 2022-08-13 DIAGNOSIS — I509 Heart failure, unspecified: Secondary | ICD-10-CM | POA: Diagnosis not present

## 2022-08-13 DIAGNOSIS — Z95820 Peripheral vascular angioplasty status with implants and grafts: Secondary | ICD-10-CM

## 2022-08-13 DIAGNOSIS — K219 Gastro-esophageal reflux disease without esophagitis: Secondary | ICD-10-CM | POA: Diagnosis present

## 2022-08-13 DIAGNOSIS — Z4781 Encounter for orthopedic aftercare following surgical amputation: Secondary | ICD-10-CM | POA: Diagnosis not present

## 2022-08-13 DIAGNOSIS — E1152 Type 2 diabetes mellitus with diabetic peripheral angiopathy with gangrene: Secondary | ICD-10-CM | POA: Diagnosis present

## 2022-08-13 DIAGNOSIS — I739 Peripheral vascular disease, unspecified: Secondary | ICD-10-CM | POA: Diagnosis not present

## 2022-08-13 DIAGNOSIS — Z79899 Other long term (current) drug therapy: Secondary | ICD-10-CM

## 2022-08-13 DIAGNOSIS — R41841 Cognitive communication deficit: Secondary | ICD-10-CM | POA: Diagnosis not present

## 2022-08-13 DIAGNOSIS — L97522 Non-pressure chronic ulcer of other part of left foot with fat layer exposed: Secondary | ICD-10-CM | POA: Diagnosis not present

## 2022-08-13 DIAGNOSIS — I132 Hypertensive heart and chronic kidney disease with heart failure and with stage 5 chronic kidney disease, or end stage renal disease: Secondary | ICD-10-CM | POA: Diagnosis not present

## 2022-08-13 DIAGNOSIS — G4733 Obstructive sleep apnea (adult) (pediatric): Secondary | ICD-10-CM | POA: Diagnosis present

## 2022-08-13 DIAGNOSIS — Z7982 Long term (current) use of aspirin: Secondary | ICD-10-CM

## 2022-08-13 DIAGNOSIS — Z803 Family history of malignant neoplasm of breast: Secondary | ICD-10-CM

## 2022-08-13 DIAGNOSIS — I5032 Chronic diastolic (congestive) heart failure: Secondary | ICD-10-CM | POA: Diagnosis not present

## 2022-08-13 DIAGNOSIS — E1165 Type 2 diabetes mellitus with hyperglycemia: Secondary | ICD-10-CM | POA: Diagnosis present

## 2022-08-13 DIAGNOSIS — M6281 Muscle weakness (generalized): Secondary | ICD-10-CM | POA: Diagnosis not present

## 2022-08-13 HISTORY — DX: Other acute osteomyelitis, left ankle and foot: M86.172

## 2022-08-13 LAB — CBC WITH DIFFERENTIAL/PLATELET
Abs Immature Granulocytes: 0.06 10*3/uL (ref 0.00–0.07)
Basophils Absolute: 0.1 10*3/uL (ref 0.0–0.1)
Basophils Relative: 1 %
Eosinophils Absolute: 0.2 10*3/uL (ref 0.0–0.5)
Eosinophils Relative: 2 %
HCT: 30.9 % — ABNORMAL LOW (ref 36.0–46.0)
Hemoglobin: 9.2 g/dL — ABNORMAL LOW (ref 12.0–15.0)
Immature Granulocytes: 1 %
Lymphocytes Relative: 21 %
Lymphs Abs: 1.9 10*3/uL (ref 0.7–4.0)
MCH: 24.2 pg — ABNORMAL LOW (ref 26.0–34.0)
MCHC: 29.8 g/dL — ABNORMAL LOW (ref 30.0–36.0)
MCV: 81.3 fL (ref 80.0–100.0)
Monocytes Absolute: 0.8 10*3/uL (ref 0.1–1.0)
Monocytes Relative: 9 %
Neutro Abs: 6.3 10*3/uL (ref 1.7–7.7)
Neutrophils Relative %: 66 %
Platelets: 315 10*3/uL (ref 150–400)
RBC: 3.8 MIL/uL — ABNORMAL LOW (ref 3.87–5.11)
RDW: 18 % — ABNORMAL HIGH (ref 11.5–15.5)
WBC: 9.3 10*3/uL (ref 4.0–10.5)
nRBC: 0.3 % — ABNORMAL HIGH (ref 0.0–0.2)

## 2022-08-13 LAB — COMPREHENSIVE METABOLIC PANEL
ALT: 28 U/L (ref 0–44)
AST: 27 U/L (ref 15–41)
Albumin: 3.1 g/dL — ABNORMAL LOW (ref 3.5–5.0)
Alkaline Phosphatase: 112 U/L (ref 38–126)
Anion gap: 13 (ref 5–15)
BUN: 22 mg/dL (ref 8–23)
CO2: 26 mmol/L (ref 22–32)
Calcium: 9.1 mg/dL (ref 8.9–10.3)
Chloride: 101 mmol/L (ref 98–111)
Creatinine, Ser: 5.2 mg/dL — ABNORMAL HIGH (ref 0.44–1.00)
GFR, Estimated: 9 mL/min — ABNORMAL LOW (ref >60)
Glucose, Bld: 265 mg/dL — ABNORMAL HIGH (ref 70–99)
Potassium: 4 mmol/L (ref 3.5–5.1)
Sodium: 140 mmol/L (ref 135–145)
Total Bilirubin: 0.4 mg/dL (ref 0.3–1.2)
Total Protein: 7.6 g/dL (ref 6.5–8.1)

## 2022-08-13 LAB — SEDIMENTATION RATE: Sed Rate: 79 mm/hr — ABNORMAL HIGH (ref 0–22)

## 2022-08-13 LAB — MRSA NEXT GEN BY PCR, NASAL: MRSA by PCR Next Gen: NOT DETECTED

## 2022-08-13 LAB — PREALBUMIN: Prealbumin: 26 mg/dL (ref 18–38)

## 2022-08-13 LAB — LACTIC ACID, PLASMA: Lactic Acid, Venous: 2.3 mmol/L (ref 0.5–1.9)

## 2022-08-13 LAB — C-REACTIVE PROTEIN: CRP: 9.1 mg/dL — ABNORMAL HIGH (ref <1.0)

## 2022-08-13 MED ORDER — VANCOMYCIN HCL 1750 MG/350ML IV SOLN
1750.0000 mg | Freq: Once | INTRAVENOUS | Status: AC
  Administered 2022-08-13: 1750 mg via INTRAVENOUS
  Filled 2022-08-13: qty 350

## 2022-08-13 MED ORDER — HYDROXYZINE HCL 25 MG PO TABS: 25.0000 mg | ORAL_TABLET | Freq: Two times a day (BID) | ORAL | Status: DC | PRN

## 2022-08-13 MED ORDER — SODIUM CHLORIDE 0.9 % IV SOLN
2.0000 g | INTRAVENOUS | Status: DC
  Administered 2022-08-13 – 2022-08-14 (×2): 2 g via INTRAVENOUS
  Filled 2022-08-13 (×2): qty 20

## 2022-08-13 MED ORDER — INSULIN GLARGINE-YFGN 100 UNIT/ML ~~LOC~~ SOLN
30.0000 [IU] | Freq: Every day | SUBCUTANEOUS | Status: DC
  Filled 2022-08-13 (×4): qty 0.3

## 2022-08-13 MED ORDER — ACETAMINOPHEN 650 MG RE SUPP: 650.0000 mg | Freq: Four times a day (QID) | RECTAL | Status: DC | PRN

## 2022-08-13 MED ORDER — SERTRALINE HCL 50 MG PO TABS
50.0000 mg | ORAL_TABLET | Freq: Every day | ORAL | Status: DC
  Administered 2022-08-14 – 2022-08-20 (×6): 50 mg via ORAL
  Filled 2022-08-13 (×6): qty 1

## 2022-08-13 MED ORDER — HEPARIN SODIUM (PORCINE) 5000 UNIT/ML IJ SOLN
5000.0000 [IU] | Freq: Three times a day (TID) | INTRAMUSCULAR | Status: DC
  Administered 2022-08-13 – 2022-08-20 (×21): 5000 [IU] via SUBCUTANEOUS
  Filled 2022-08-13 (×21): qty 1

## 2022-08-13 MED ORDER — SODIUM CHLORIDE 0.9 % IV SOLN: 2.0000 g | Freq: Once | INTRAVENOUS | Status: DC

## 2022-08-13 MED ORDER — VANCOMYCIN HCL IN DEXTROSE 1-5 GM/200ML-% IV SOLN: 1000.0000 mg | INTRAVENOUS | Status: DC

## 2022-08-13 MED ORDER — KETOTIFEN FUMARATE 0.035 % OP SOLN
1.0000 [drp] | Freq: Two times a day (BID) | OPHTHALMIC | Status: DC
  Administered 2022-08-14 – 2022-08-20 (×12): 1 [drp] via OPHTHALMIC
  Filled 2022-08-13: qty 5

## 2022-08-13 MED ORDER — INSULIN ASPART 100 UNIT/ML IJ SOLN
0.0000 [IU] | Freq: Three times a day (TID) | INTRAMUSCULAR | Status: DC
  Administered 2022-08-14: 1 [IU] via SUBCUTANEOUS
  Administered 2022-08-14 – 2022-08-16 (×2): 2 [IU] via SUBCUTANEOUS
  Administered 2022-08-16: 5 [IU] via SUBCUTANEOUS
  Administered 2022-08-16: 7 [IU] via SUBCUTANEOUS
  Administered 2022-08-18 (×2): 2 [IU] via SUBCUTANEOUS
  Administered 2022-08-20: 1 [IU] via SUBCUTANEOUS
  Administered 2022-08-20: 3 [IU] via SUBCUTANEOUS

## 2022-08-13 MED ORDER — METRONIDAZOLE 500 MG PO TABS
500.0000 mg | ORAL_TABLET | Freq: Two times a day (BID) | ORAL | Status: DC
  Administered 2022-08-13 – 2022-08-14 (×2): 500 mg via ORAL
  Filled 2022-08-13 (×2): qty 1

## 2022-08-13 MED ORDER — SUCROFERRIC OXYHYDROXIDE 500 MG PO CHEW
500.0000 mg | CHEWABLE_TABLET | ORAL | Status: DC
  Administered 2022-08-14 – 2022-08-20 (×6): 500 mg via ORAL
  Filled 2022-08-13 (×4): qty 1

## 2022-08-13 MED ORDER — FUROSEMIDE 40 MG PO TABS
80.0000 mg | ORAL_TABLET | Freq: Two times a day (BID) | ORAL | Status: DC
  Administered 2022-08-13 – 2022-08-20 (×13): 80 mg via ORAL
  Filled 2022-08-13 (×6): qty 2
  Filled 2022-08-13: qty 4
  Filled 2022-08-13 (×6): qty 2

## 2022-08-13 MED ORDER — EZETIMIBE 10 MG PO TABS
10.0000 mg | ORAL_TABLET | Freq: Every day | ORAL | Status: DC
  Administered 2022-08-14 – 2022-08-20 (×6): 10 mg via ORAL
  Filled 2022-08-13 (×6): qty 1

## 2022-08-13 MED ORDER — ONDANSETRON HCL 4 MG/2ML IJ SOLN: 4.0000 mg | Freq: Four times a day (QID) | INTRAMUSCULAR | Status: DC | PRN

## 2022-08-13 MED ORDER — CALCITRIOL 0.25 MCG PO CAPS
0.2500 ug | ORAL_CAPSULE | Freq: Two times a day (BID) | ORAL | Status: DC
  Administered 2022-08-14 – 2022-08-20 (×11): 0.25 ug via ORAL
  Filled 2022-08-13 (×12): qty 1

## 2022-08-13 MED ORDER — DULOXETINE HCL 60 MG PO CPEP
60.0000 mg | ORAL_CAPSULE | Freq: Every day | ORAL | Status: DC
  Administered 2022-08-14 – 2022-08-20 (×6): 60 mg via ORAL
  Filled 2022-08-13 (×6): qty 1

## 2022-08-13 MED ORDER — ACETAMINOPHEN 325 MG PO TABS
650.0000 mg | ORAL_TABLET | Freq: Four times a day (QID) | ORAL | Status: DC | PRN
  Administered 2022-08-17 – 2022-08-19 (×5): 650 mg via ORAL
  Filled 2022-08-13 (×6): qty 2

## 2022-08-13 MED ORDER — METOPROLOL SUCCINATE ER 50 MG PO TB24
50.0000 mg | ORAL_TABLET | Freq: Every day | ORAL | Status: DC
  Administered 2022-08-14 – 2022-08-20 (×7): 50 mg via ORAL
  Filled 2022-08-13 (×7): qty 1

## 2022-08-13 MED ORDER — SUCROFERRIC OXYHYDROXIDE 500 MG PO CHEW
1000.0000 mg | CHEWABLE_TABLET | Freq: Three times a day (TID) | ORAL | Status: DC
  Administered 2022-08-14 – 2022-08-20 (×13): 1000 mg via ORAL
  Filled 2022-08-13 (×16): qty 2

## 2022-08-13 MED ORDER — PREGABALIN 25 MG PO CAPS
50.0000 mg | ORAL_CAPSULE | Freq: Three times a day (TID) | ORAL | Status: DC
  Administered 2022-08-13 – 2022-08-20 (×20): 50 mg via ORAL
  Filled 2022-08-13 (×20): qty 2

## 2022-08-13 MED ORDER — ASPIRIN 81 MG PO CHEW
81.0000 mg | CHEWABLE_TABLET | Freq: Every day | ORAL | Status: AC
  Administered 2022-08-14: 81 mg via ORAL
  Filled 2022-08-13: qty 1

## 2022-08-13 MED ORDER — ONDANSETRON HCL 4 MG PO TABS: 4.0000 mg | ORAL_TABLET | Freq: Four times a day (QID) | ORAL | Status: DC | PRN

## 2022-08-13 MED ORDER — OXYCODONE-ACETAMINOPHEN 5-325 MG PO TABS
1.0000 | ORAL_TABLET | ORAL | Status: DC | PRN
  Administered 2022-08-15 – 2022-08-16 (×3): 1 via ORAL
  Filled 2022-08-13 (×3): qty 1

## 2022-08-13 MED ORDER — INSULIN ASPART 100 UNIT/ML IJ SOLN
3.0000 [IU] | Freq: Three times a day (TID) | INTRAMUSCULAR | Status: DC
  Administered 2022-08-14 – 2022-08-18 (×7): 3 [IU] via SUBCUTANEOUS

## 2022-08-13 NOTE — ED Notes (Signed)
Dr. Earlene Plater at the bedside to give pt and family an update

## 2022-08-13 NOTE — Assessment & Plan Note (Addendum)
With diabetic wet gangrene of foot (see photos above). LE wound pathway Empiric rocephin, flagyl, vanc for the moment Doesn't sound like there are any good further revascularization options based on DPM office note from today / angio on 4/30. Will defer to DPM if they want to get repeat vascular imaging and/or vasc surg re-involvement at this time. DPM planning on seeing her in hospital tomorrow (they sent her into ED for admission). Sounds like transmetatarsal amputation planned for Sat.

## 2022-08-13 NOTE — ED Provider Notes (Signed)
[facility] AT [facility] Provider Note   CSN: [id number] Arrival date & time: 08/13/22  1355     History  No chief complaint on file.   Kathryn Beck is a 67 y.o. female.  HPI 67 year old female PMH anemia, ESRD on HD, HTN, CVA, DM2, S/p RLE BKA, chronic left foot wounds presenting for left foot wound.  Patient is been following with podiatry for left foot wounds.  She did follow-up with podiatry today which noted worsening ulcerations on her left first, second, fifth toes.  She had an x-ray today, I cannot see the radiology read but she does have some erosions of her left first digit concerning for osteomyelitis.  No signs of necrotizing infection.  She was sent to the ED by her podiatry provider will plan for likely operative intervention on Saturday.  She otherwise feels well.  She last had dialysis yesterday.  She has not had any fevers or chills or other symptoms.     Home Medications Prior to Admission medications   Medication Sig Start Date End Date Taking? Authorizing Provider  aspirin 81 MG chewable tablet CHEW TWO TABLETS BY MOUTH EVERY DAY 07/24/22   [name], MD  azelastine (OPTIVAR) 0.05 % ophthalmic solution Place 1 drop into both eyes 2 (two) times daily. 05/25/22   [name], MD  B Complex-C-Folic Acid (RENA-VITE RX) 1 MG TABS Take 1 tablet by mouth daily. 04/15/22   [provider]  Blood Glucose Monitoring Suppl (ONETOUCH VERIO) w/Device KIT Use to check blood sugar 4X daily. 04/16/22   Carlus Pavlov, MD  calcitRIOL (ROCALTROL) 0.25 MCG capsule TAKE 1 CAPSULE (0.25 MCG TOTAL) BY MOUTH 2 (TWO) TIMES DAILY WITH A MEAL. 06/05/22   Roma Kayser, MD  Continuous Blood Gluc Receiver (FREESTYLE LIBRE 2 READER) DEVI 1 each by Does not apply route daily. 04/16/22   Carlus Pavlov, MD  Continuous Blood Gluc Sensor (FREESTYLE LIBRE 2 SENSOR) MISC 1 each by Does not apply route every 14 (fourteen) days. 04/16/22    Carlus Pavlov, MD  doxycycline (VIBRA-TABS) 100 MG tablet Take 1 tablet (100 mg total) by mouth 2 (two) times daily. 08/06/22   Vivi Barrack, DPM  DULoxetine (CYMBALTA) 60 MG capsule TAKE (1) CAPSULE BY MOUTH ONCE DAILY. Patient taking differently: Take 60 mg by mouth daily. 09/17/20   [name], MD  ezetimibe (ZETIA) 10 MG tablet Take 1 tablet (10 mg total) by mouth daily. 05/25/22   [name], MD  furosemide (LASIX) 80 MG tablet Take 80 mg by mouth 2 (two) times daily. 05/14/22   [provider]  glucose blood (ONETOUCH VERIO) test strip Use as instructed to check blood sugar 4X daily. 04/16/22   Carlus Pavlov, MD  hydrOXYzine (ATARAX) 25 MG tablet Take 25 mg by mouth every 12 (twelve) hours as needed for itching.    [provider]  insulin glargine, 2 Unit Dial, (TOUJEO MAX SOLOSTAR) 300 UNIT/ML Solostar Pen Inject 30 Units into the skin daily. May need to slowly increase the dose depending upon your blood sugar, follow-up with PCP 07/22/22   Lanae Boast, MD  Insulin Pen Needle 32G X 4 MM MISC Use 4x a day 05/25/22   Carlus Pavlov, MD  metoprolol succinate (TOPROL-XL) 50 MG 24 hr tablet TAKE (1) TABLET BY MOUTH DAILY WITH FOOD *TAKE AFTER DIALYSIS* Patient taking differently: Take 50 mg by mouth daily. 07/29/21   [name], MD  OneTouch Delica Lancets 33G MISC Use to check blood sugar 4X daily. 04/16/22   Carlus Pavlov, MD  oxyCODONE-acetaminophen (PERCOCET/ROXICET) 5-325 MG tablet Take 1 tablet by mouth every 4 (four) hours as needed. 06/10/22   [name], MD  pregabalin (LYRICA) 50 MG capsule Take 1 capsule (50 mg total) by mouth 3 (three) times daily. 06/10/22 06/10/23  [name], MD  sertraline (ZOLOFT) 50 MG tablet TAKE ONE TABLET BY MOUTH EVERY DAY 07/13/22   [name], MD  [name] 500 MG chewable tablet Chew 500-1,000 mg by mouth See admin instructions. Take 1000mg  (2 tablets) by mouth with meals and 500mg  (1 tablet)  with snacks 04/15/22   [provider]  FLUoxetine (PROZAC) 10 MG capsule Take 10 mg by mouth daily.    05/28/11  [provider]  glipiZIDE (GLUCOTROL) 10 MG tablet Take 10 mg by mouth 2 (two) times daily before a meal.    05/28/11  [provider]      Allergies    Ace inhibitors, Penicillins, Statins, and Albuterol    Review of Systems   Review of Systems  Skin:  Positive for wound.  All other systems reviewed and are negative.   Physical Exam Updated Vital Signs BP (!) 157/65 (BP Location: Right Arm)   Pulse 86   Temp 98.8 F (37.1 C)   Resp 18   SpO2 100%  Physical Exam Vitals and nursing note reviewed.  Constitutional:      General: She is not in acute distress.    Appearance: She is well-developed.  HENT:     Head: Normocephalic and atraumatic.  Eyes:     Conjunctiva/sclera: Conjunctivae normal.  Cardiovascular:     Rate and Rhythm: Normal rate and regular rhythm.     Heart sounds: No murmur heard. Pulmonary:     Effort: Pulmonary effort is normal. No respiratory distress.     Breath sounds: Normal breath sounds.  Abdominal:     Palpations: Abdomen is soft.     Tenderness: There is no abdominal tenderness.  Musculoskeletal:        General: No swelling.     Cervical back: Neck supple.     Comments: She has deep ulcerations and wounds to the left first, second, fifth digits.  She has subjectively decreased sensation in the left foot but is able to feel some light touch.  I do not feel any crepitus.  Skin:    General: Skin is warm and dry.     Capillary Refill: Capillary refill takes less than 2 seconds.  Neurological:     Mental Status: She is alert.  Psychiatric:        Mood and Affect: Mood normal.     ED Results / Procedures / Treatments   Labs (all labs ordered are listed, but only abnormal results are displayed) Labs Reviewed  LACTIC ACID, PLASMA - Abnormal; Notable for the following components:      Result Value   Lactic  Acid, Venous 2.3 (*)    All other components within normal limits  COMPREHENSIVE METABOLIC PANEL - Abnormal; Notable for the following components:   Glucose, Bld 265 (*)    Creatinine, Ser 5.20 (*)    Albumin 3.1 (*)    GFR, Estimated 9 (*)    All other components within normal limits  CBC WITH DIFFERENTIAL/PLATELET - Abnormal; Notable for the following components:   RBC 3.80 (*)    Hemoglobin 9.2 (*)    HCT 30.9 (*)  MCH 24.2 (*)    MCHC 29.8 (*)    RDW 18.0 (*)    nRBC 0.3 (*)    All other components within normal limits  CULTURE, BLOOD (ROUTINE X 2)  CULTURE, BLOOD (ROUTINE X 2)  MRSA NEXT GEN BY PCR, NASAL  SEDIMENTATION RATE  C-REACTIVE PROTEIN  PREALBUMIN  CBC  BASIC METABOLIC PANEL    EKG None  Radiology DG Foot Complete Left  Result Date: 08/13/2022 Please see detailed radiograph report in office note.  DG Chest 2 View  Result Date: 08/13/2022 CLINICAL DATA:  Toe infection. EXAM: CHEST - 2 VIEW COMPARISON:  Chest radiograph 04/24/2022 FINDINGS: The cardiac silhouette remains mildly enlarged. Lung volumes are low. No acute airspace opacity, edema, sizable pleural effusion, or pneumothorax is identified. Scarring is again noted in the left mid to lower lung. No acute osseous abnormality is seen. IMPRESSION: No active cardiopulmonary disease. Electronically Signed   By: Sebastian Ache M.D.   On: 08/13/2022 15:28    Procedures Procedures    Medications Ordered in ED Medications  cefTRIAXone (ROCEPHIN) 2 g in sodium chloride 0.9 % 100 mL IVPB (2 g Intravenous New Bag/Given 08/13/22 2015)  metroNIDAZOLE (FLAGYL) tablet 500 mg (has no administration in time range)  vancomycin (VANCOREADY) IVPB 1750 mg/350 mL (has no administration in time range)  vancomycin (VANCOCIN) IVPB 1000 mg/200 mL premix (has no administration in time range)  heparin injection 5,000 Units (has no administration in time range)  acetaminophen (TYLENOL) tablet 650 mg (has no administration in  time range)    Or  acetaminophen (TYLENOL) suppository 650 mg (has no administration in time range)  ondansetron (ZOFRAN) tablet 4 mg (has no administration in time range)    Or  ondansetron (ZOFRAN) injection 4 mg (has no administration in time range)    ED Course/ Medical Decision Making/ A&P                             Medical Decision Making Amount and/or Complexity of Data Reviewed Labs: ordered. Radiology: ordered.  Risk Prescription drug management. Decision regarding hospitalization.   67 year old female with history of diabetes, peripheral artery disease presenting for left foot wounds, concern for osteomyelitis.  She has been to podiatry today who plans for transmetatarsal amputation.  On exam she is medically stable.  She does not appear overtly septic at this time.  Will plan to administer antibiotics and admit to the hospitalist service for management of osteomyelitis.  Her lab work is notable for hemoglobin 9.2, mildly elevated lactic acid.  Chest x-ray reviewed, no signs of pneumonia, pulm edema, pneumothorax or other acute abnormality.  I discussed the patient with podiatry Dr. Lilian Kapur.  They request admission to medicine for antibiotics, plan for operative intervention on Saturday.  Patient agreeable with this plan.  Discussed with the hospitalist and admitted for further management.        Final Clinical Impression(s) / ED Diagnoses Final diagnoses:  Acute osteomyelitis [facility])    Rx / DC Orders ED Discharge Orders     None         Fulton Reek, MD 08/13/22 2030    Elayne Snare K, DO 08/13/22 2301

## 2022-08-13 NOTE — Assessment & Plan Note (Signed)
Continue home glargine 30u daily Takes 10-20u aspart TID at home Will put on 3u novlog mealtime here to start And sensitive SSI AC

## 2022-08-13 NOTE — Progress Notes (Addendum)
Subjective: No chief complaint on file.  67 year old female presents the office today for Evaluation of wounds on her left foot.  She went to the wound care center as well but the wounds have continued to worsen since I saw her last week.  She does not report any fevers or chills.  She has been on oral antibiotics without significant improvement.  Agnio 07/21/2022 Impression:            #1  Total occlusion of the distal left superficial femoral and left proximal above-knee popliteal artery successfully crossed.  This was heavily calcified so it was initially treated with shockwave intra-arterial lithotripsy followed by stenting.  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  She needs to continue on Plavix and aspirin.  She has a statin allergy.              Objective: AAO x3, NAD Skin warm and perfused. Full-thickness ulcerations noted to the lateral edge of the hallux, distal second toe with exposed bone as well as ulceration of the fifth toe.  There is no fluctuance or condition.  Mild edema.  No ascending cellulitis. No pain with calf compression, swelling, warmth, erythema  Assessment: Ulcerations, gangrene left foot- osteomyelitis  Plan: -All treatment options discussed with the patient including all alternatives, risks, complications.  -X-rays obtained reviewed left foot.  Significant vessel calcifications are present.  Ulceration noted on the hallux.  There is deformity present.  No soft tissue emphysema. -The ulcers have significantly progressed.  Given this I do think that the transmetatarsal amputation is her best option.  Discussed risks of this including possible nonhealing  given her circulatory status which would require more proximal amputation.  She is aware and her daughter was on the phone today.  She is going to present to [facility] department.  I discussed the case with Dr. Lilian Kapur who is on-call and plan for surgery likely Saturday morning.  Vivi Barrack DPM

## 2022-08-13 NOTE — ED Triage Notes (Signed)
Pt reports that she had an appt with MD Ardelle Anton at 1300 today for an infection to her toes on her left foot and was sent here to be admitted for surgery.

## 2022-08-13 NOTE — ED Notes (Signed)
Pt eating a cheeseburger and drinking coke that was brought in her husband. Reports., "I do not care about diet orders, I have not ate all day and probably will not eat tomorrow".

## 2022-08-13 NOTE — ED Notes (Signed)
Pt's left foot was wrapped with non-adherent, guaze and ace wrap for protection

## 2022-08-13 NOTE — Assessment & Plan Note (Signed)
Con;t metoprolol 

## 2022-08-13 NOTE — Progress Notes (Addendum)
Pharmacy Antibiotic Note  Kathryn Beck is a 67 y.o. female admitted on 08/13/2022 after podiatry evaluation of L foot wounds concerning for osteomyelitis. Admitted for operative intervention. Pharmacy has been consulted for vancomycin dosing. PMH includes ESRD on HD MWF - last HD session 5/22. 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   Plan: Vancomycin 1750 mg IV x1, followed by Vancomycin 1000mg  IV qHD F/u surgery plans, cultures data, vanc levels as needed Monitor HD schedule and tolerability Ceftriaxone and Flagyl per MD    Temp (24hrs), Avg:98.5 F (36.9 C), Min:98.1 F (36.7 C), Max:98.8 F (37.1 C)  Recent Labs  Lab 08/13/22 1452  WBC 9.3  CREATININE 5.20*  LATICACIDVEN 2.3*    Estimated Creatinine Clearance: 11 mL/min (A) (by C-G formula based on SCr of 5.2 mg/dL (H)).    Allergies  Allergen Reactions   Ace Inhibitors Anaphylaxis and Swelling   Penicillins Itching, Swelling and Other (See Comments)    Did it involve swelling of the face/tongue/throat, SOB, or low BP? Unknown Did it involve sudden or severe rash/hives, skin peeling, or any reaction on the inside of your mouth or nose? Unknown Did you need to seek medical attention at a hospital or doctor's office? Unknown When did it last happen?      years  If all above answers are "NO", may proceed with cephalosporin use.    Statins Other (See Comments)    elevated LFT's     Albuterol Swelling    Antimicrobials this admission: Vancomycin 5/23 >>  Ceftriaxone 5/24 >> Flagyl 5/23 >>  Dose adjustments this admission:  Microbiology results: 5/23 BCx:   Thank you for allowing pharmacy to be a part of this patient's care.  Rexford Maus, PharmD, BCPS 08/13/2022 7:32 PM

## 2022-08-13 NOTE — H&P (Signed)
History and Physical    Patient: Kathryn Beck BJY:[id number] DOB: [birth year] DOA: 08/13/2022 DOS: the patient was seen and examined on 08/13/2022 PCP: [name], MD  Patient coming from: Home  Chief Complaint: No chief complaint on file.  HPI: Kathryn Beck is a 67 y.o. female with medical history significant of ESRD on HD, HTN, DM2, CVA.  Pt with recent RLE BKA performed by Dr. Lajoyce Corners.  Pt battling with LLE chronic toe wounds, had recent angiogram and stenting on 4/30: Impression:            #1  Total occlusion of the distal left superficial femoral and left proximal above-knee popliteal artery successfully crossed.  This was heavily calcified so it was initially treated with shockwave intra-arterial lithotripsy followed by stenting.  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  She needs to continue on Plavix and aspirin.  She has a statin allergy.  She has been seeing TFA for her L foot.  Unfortunately despite vascular intervention and despite doxycycline + wound care: her foot wounds have continued to worsen over the past week.  Dr. Ardelle Anton has sent pt in for admission to hospital for IV ABx and likely transmetatarsal amputation which is currently being planned for Sat (see his office PN from earlier today).  Review of Systems: As mentioned in the history of present illness. All other systems reviewed and are negative. Past Medical History:  Diagnosis Date   Acid reflux    Anemia    Arthritis    Axillary masses    Soft tissue - status post excision   Back pain    COVID-19 virus infection 04/06/2019   Depression    End-stage renal disease (HCC)    M/W/F  dialysis   Essential hypertension    Headache    years ago   History of blood transfusion    History of cardiac catheterization    Normal coronary arteries October 2020   History of claustrophobia    History of pneumonia 2019   Hypoxia 04/03/2019   Memory loss    Mixed hyperlipidemia    Obesity    Pancreatitis    Peritoneal dialysis catheter in [facility])    Pneumonia due to COVID-19 virus 04/02/2019   Sleep apnea    Noncompliant with CPAP   Stroke (HCC)    mini stroke   Type 2 diabetes mellitus (HCC)    Past Surgical History:  Procedure Laterality Date   ABDOMINAL AORTOGRAM W/LOWER EXTREMITY N/A 04/30/2022   Procedure: ABDOMINAL AORTOGRAM W/LOWER EXTREMITY;  Surgeon: Leonie Douglas, MD;  Location: [facility] INV[facility]IVE CV LAB;  Service: Cardiovascular;  Laterality: N/A;   ABDOMINAL AORTOGRAM W/LOWER EXTREMITY N/A 07/21/2022   Procedure: ABDOMINAL AORTOGRAM W/LOWER EXTREMITY;  Surgeon: Nada Libman, MD;  Location: [facility];  Service: Cardiovascular;  Laterality: N/A;   ABDOMINAL HYSTERECTOMY     AMPUTATION Right 05/29/2022   Procedure: RIGHT BELOW THE KNEE AMPUTATION;  Surgeon: [name], MD;  Location: [facility] OR;  Service: Orthopedics;  Laterality: Right;  AV FISTULA PLACEMENT Left 09/02/2017   Procedure: creation of left arm ARTERIOVENOUS (AV) FISTULA;  Surgeon: Nada Libman, MD;  Location: [facility] OR;  Service: Vascular;  Laterality: Left;   COLONOSCOPY  2008   Dr. Darrick Penna: normal    COLONOSCOPY N/A 12/18/2016   Dr. Darrick Penna: multiple tubular adenomas, internal hemorrhoids. Surveillance in 3 years    ESOPHAGEAL DILATION N/A 10/13/2015   Procedure: ESOPHAGEAL DILATION;  Surgeon: Malissa Hippo, MD;  Location: [facility];  Service: Endoscopy;  Laterality: N/A;   ESOPHAGOGASTRODUODENOSCOPY N/A 10/13/2015   Dr. Karilyn Cota: chronic gastritis on path, no H.pylori. Empiric dilation    ESOPHAGOGASTRODUODENOSCOPY N/A 12/18/2016   Dr. Darrick Penna: mild gastritis. BRAVO study revealed  uncontrolled GERD. Dysphagia secondary to uncontrolled reflux   FOOT SURGERY Bilateral    "nerve"     LEFT HEART CATH AND CORONARY ANGIOGRAPHY N/A 12/29/2018   Procedure: LEFT HEART CATH AND CORONARY ANGIOGRAPHY;  Surgeon: Corky Crafts, MD;  Location: [facility] INVASIVE CV LAB;  Service: Cardiovascular;  Laterality: N/A;   LOWER EXTREMITY ANGIOGRAPHY Right 05/04/2022   Procedure: Lower Extremity Angiography;  Surgeon: Victorino Sparrow, MD;  Location: [facility] INVASIVE CV LAB;  Service: Cardiovascular;  Laterality: Right;   LUNG BIOPSY     MASS EXCISION Right 01/09/2013   Procedure: EXCISION OF NEOPLASM OF RIGHT  AXILLA  AND EXCISION OF NEOPLASM OF LEFT AXILLA;  Surgeon: Dalia Heading, MD;  Location: [facility] ORS;  Service: General;  Laterality: Right;  procedure end @ 08:23   MYRINGOTOMY WITH TUBE PLACEMENT Bilateral 04/28/2017   Procedure: BILATERAL MYRINGOTOMY WITH TUBE PLACEMENT;  Surgeon: Newman Pies, MD;  Location: [facility] OR;  Service: ENT;  Laterality: Bilateral;   PERIPHERAL VASCULAR BALLOON ANGIOPLASTY Right 05/04/2022   Procedure: PERIPHERAL VASCULAR BALLOON ANGIOPLASTY;  Surgeon: Victorino Sparrow, MD;  Location: [facility] INVASIVE CV LAB;  Service: Cardiovascular;  Laterality: Right;  PT   PERIPHERAL VASCULAR INTERVENTION Right 05/04/2022   Procedure: PERIPHERAL VASCULAR INTERVENTION;  Surgeon: Victorino Sparrow, MD;  Location: [facility] INVASIVE CV LAB;  Service: Cardiovascular;  Laterality: Right;  SFA   PERIPHERAL VASCULAR INTERVENTION Left 07/21/2022   Procedure: PERIPHERAL VASCULAR INTERVENTION;  Surgeon: Nada Libman, MD;  Location: [facility];  Service: Cardiovascular;  Laterality: Left;   REVISION OF ARTERIOVENOUS GORETEX GRAFT Left 05/04/2018   Procedure: TRANSPOSITION OF CEPHALIC VEIN ARTERIOVENOUS FISTULA LEFT ARM;  Surgeon: Larina Earthly, MD;  Location: [facility] OR;  Service: Vascular;  Laterality: Left;   SAVORY DILATION N/A 12/18/2016   Procedure: SAVORY DILATION;  Surgeon: West Bali, MD;  Location:  [facility];  Service: Endoscopy;  Laterality: N/A;   Social History:  reports that she has never smoked. She has never been exposed to tobacco smoke. She has never used smokeless tobacco. She reports that she does not drink alcohol and does not use drugs.  Allergies  Allergen Reactions   Ace Inhibitors Anaphylaxis and Swelling   Penicillins Itching, Swelling and Other (See Comments)    Did it involve swelling of the face/tongue/throat, SOB, or low BP? Unknown Did it involve sudden or severe rash/hives, skin peeling, or any reaction on the inside of your mouth or nose? Unknown Did you need to seek medical attention at a hospital or doctor's office? Unknown When did it last happen?      years  If all above answers are "NO", may proceed with cephalosporin use.    Statins Other (See Comments)    elevated LFT's  Albuterol Swelling    Family History  Problem Relation Age of Onset   Hypertension Father    Hypercholesterolemia Father    Arthritis Father    Hypertension Sister    Hypercholesterolemia Sister    Breast cancer Sister    Hypertension Sister    Colon cancer Neg Hx    Colon polyps Neg Hx     Prior to Admission medications   Medication Sig Start Date End Date Taking? Authorizing Provider  aspirin 81 MG chewable tablet CHEW TWO TABLETS BY MOUTH EVERY DAY 07/24/22  Yes [name], MD  azelastine (OPTIVAR) 0.05 % ophthalmic solution Place 1 drop into both eyes 2 (two) times daily. 05/25/22  Yes [name], MD  B Complex-C-Folic Acid (RENA-VITE RX) 1 MG TABS Take 1 tablet by mouth daily. 04/15/22  Yes [provider]  calcitRIOL (ROCALTROL) 0.25 MCG capsule TAKE 1 CAPSULE (0.25 MCG TOTAL) BY MOUTH 2 (TWO) TIMES DAILY WITH A MEAL. 06/05/22  Yes Nida, Denman George, MD  doxycycline (VIBRA-TABS) 100 MG tablet Take 1 tablet (100 mg total) by mouth 2 (two) times daily. 08/06/22  Yes Vivi Barrack, DPM  DULoxetine (CYMBALTA) 60 MG capsule TAKE (1)  CAPSULE BY MOUTH ONCE DAILY. Patient taking differently: Take 60 mg by mouth daily. 09/17/20  Yes [name], MD  ezetimibe (ZETIA) 10 MG tablet Take 1 tablet (10 mg total) by mouth daily. 05/25/22  Yes [name], MD  FIASP FLEXTOUCH 100 UNIT/ML FlexTouch Pen Inject 10-20 Units into the skin See admin instructions. Inj 10-20 three times daily via sliding scale 08/06/22  Yes [provider]  furosemide (LASIX) 80 MG tablet Take 80 mg by mouth 2 (two) times daily. 05/14/22  Yes [provider]  hydrOXYzine (ATARAX) 25 MG tablet Take 25 mg by mouth every 12 (twelve) hours as needed for itching.   Yes [provider]  insulin glargine, 2 Unit Dial, (TOUJEO MAX SOLOSTAR) 300 UNIT/ML Solostar Pen Inject 30 Units into the skin daily. May need to slowly increase the dose depending upon your blood sugar, follow-up with PCP 07/22/22  Yes Kc, Dayna Barker, MD  metoprolol succinate (TOPROL-XL) 50 MG 24 hr tablet TAKE (1) TABLET BY MOUTH DAILY WITH FOOD *TAKE AFTER DIALYSIS* Patient taking differently: Take 50 mg by mouth daily. 07/29/21  Yes [name], MD  oxyCODONE-acetaminophen (PERCOCET/ROXICET) 5-325 MG tablet Take 1 tablet by mouth every 4 (four) hours as needed. 06/10/22  Yes [name], MD  pregabalin (LYRICA) 50 MG capsule Take 1 capsule (50 mg total) by mouth 3 (three) times daily. 06/10/22 06/10/23 Yes [name], MD  sertraline (ZOLOFT) 50 MG tablet TAKE ONE TABLET BY MOUTH EVERY DAY 07/13/22  Yes [name], MD  [name] 500 MG chewable tablet Chew 500-1,000 mg by mouth See admin instructions. Take 1000mg  (2 tablets) by mouth with meals and 500mg  (1 tablet) with snacks 04/15/22  Yes [provider]  Blood Glucose Monitoring Suppl (ONETOUCH VERIO) w/Device KIT Use to check blood sugar 4X daily. 04/16/22   Carlus Pavlov, MD  Continuous Blood Gluc Receiver (FREESTYLE LIBRE 2 READER) DEVI 1 each by Does not apply route daily. 04/16/22    Carlus Pavlov, MD  Continuous Blood Gluc Sensor (FREESTYLE LIBRE 2 SENSOR) MISC 1 each by Does not apply route every 14 (fourteen) days. 04/16/22   Carlus Pavlov, MD  glucose blood (ONETOUCH VERIO) test strip Use as instructed to check blood sugar 4X daily. 04/16/22  Carlus Pavlov, MD  Insulin Pen Needle 32G X 4 MM MISC Use 4x a day 05/25/22   Carlus Pavlov, MD  OneTouch Delica Lancets 33G MISC Use to check blood sugar 4X daily. 04/16/22   Carlus Pavlov, MD  FLUoxetine (PROZAC) 10 MG capsule Take 10 mg by mouth daily.    05/28/11  [provider]  glipiZIDE (GLUCOTROL) 10 MG tablet Take 10 mg by mouth 2 (two) times daily before a meal.    05/28/11  [provider]    Physical Exam: Vitals:   08/13/22 2055 08/13/22 2100 08/13/22 2230 08/13/22 2230  BP:  (!) 162/70 (!) 159/68   Pulse:  80 78   Resp:  20 17   Temp: 97.6 F (36.4 C)   98.1 F (36.7 C)  TempSrc: Oral   Axillary  SpO2:  95% 98%    Constitutional: NAD, calm, comfortable Respiratory: clear to auscultation bilaterally, no wheezing, no crackles. Normal respiratory effort. No accessory muscle use.  Cardiovascular: Regular rate and rhythm, no murmurs / rubs / gallops. No extremity edema. 2+ pedal pulses. No carotid bruits.  Abdomen: no tenderness, no masses palpated. No hepatosplenomegaly. Bowel sounds positive.  MSK: s/p BKA of RLE. Skin:   Neurologic: CN 2-12 grossly intact. Sensation intact, DTR normal. Strength 5/5 in all 4.  Psychiatric: Normal judgment and insight. Alert and oriented x 3. Normal mood.   Data Reviewed:    Labs on Admission: I have personally reviewed following labs and imaging studies  CBC: Recent Labs  Lab 08/13/22 1452  WBC 9.3  NEUTROABS 6.3  HGB 9.2*  HCT 30.9*  MCV 81.3  PLT 315   Basic Metabolic Panel: Recent Labs  Lab 08/13/22 1452  NA 140  K 4.0  CL 101  CO2 26  GLUCOSE 265*  BUN 22  CREATININE 5.20*  CALCIUM 9.1   GFR: Estimated Creatinine  Clearance: 11 mL/min (A) (by C-G formula based on SCr of 5.2 mg/dL (H)). Liver Function Tests: Recent Labs  Lab 08/13/22 1452  AST 27  ALT 28  ALKPHOS 112  BILITOT 0.4  PROT 7.6  ALBUMIN 3.1*    Urine analysis:    Component Value Date/Time   COLORURINE YELLOW 11/07/2017 2340   APPEARANCEUR HAZY (A) 11/07/2017 2340   LABSPEC 1.017 11/07/2017 2340   PHURINE 5.0 11/07/2017 2340   GLUCOSEU 150 (A) 11/07/2017 2340   HGBUR NEGATIVE 11/07/2017 2340   BILIRUBINUR NEGATIVE 11/07/2017 2340   BILIRUBINUR neg 02/15/2017 0913   KETONESUR NEGATIVE 11/07/2017 2340   PROTEINUR >=300 (A) 11/07/2017 2340   UROBILINOGEN 0.2 02/15/2017 0913   NITRITE NEGATIVE 11/07/2017 2340   LEUKOCYTESUR NEGATIVE 11/07/2017 2340    Radiological Exams on Admission: DG Foot Complete Left  Result Date: 08/13/2022 Please see detailed radiograph report in office note.  DG Chest 2 View  Result Date: 08/13/2022 CLINICAL DATA:  Toe infection. EXAM: CHEST - 2 VIEW COMPARISON:  Chest radiograph 04/24/2022 FINDINGS: The cardiac silhouette remains mildly enlarged. Lung volumes are low. No acute airspace opacity, edema, sizable pleural effusion, or pneumothorax is identified. Scarring is again noted in the left mid to lower lung. No acute osseous abnormality is seen. IMPRESSION: No active cardiopulmonary disease. Electronically Signed   By: Sebastian Ache M.D.   On: 08/13/2022 15:28     Assessment and Plan: * Acute osteomyelitis of toe of left foot (HCC) With diabetic wet gangrene of foot (see photos above). LE wound pathway Empiric rocephin, flagyl, vanc for the moment Doesn't sound  like there are any good further revascularization options based on DPM office note from today / angio on 4/30. Will defer to DPM if they want to get repeat vascular imaging and/or vasc surg re-involvement at this time. DPM planning on seeing her in hospital tomorrow (they sent her into ED for admission). Sounds like transmetatarsal  amputation planned for Sat.  ESRD on hemodialysis (HCC) Call nephro in AM for routine IP dialysis during stay.  Chronic pain syndrome Cont home lyrica and percocet  HTN (hypertension) Cont metoprolol.  Type 2 diabetes mellitus with hyperglycemia (HCC) Continue home glargine 30u daily Takes 10-20u aspart TID at home Will put on 3u novlog mealtime here to start And sensitive SSI [facility]      Advance Care Planning:   Code Status: Full Code  Consults: EDP d/w Dr. Ardelle Anton DPM  Family Communication: Family at bedside  Severity of Illness: The appropriate patient status for this patient is INPATIENT. Inpatient status is judged to be reasonable and necessary in order to provide the required intensity of service to ensure the patient's safety. The patient's presenting symptoms, physical exam findings, and initial radiographic and laboratory data in the context of their chronic comorbidities is felt to place them at high risk for further clinical deterioration. Furthermore, it is not anticipated that the patient will be medically stable for discharge from the hospital within 2 midnights of admission.   * I certify that at the point of admission it is my clinical judgment that the patient will require inpatient hospital care spanning beyond 2 midnights from the point of admission due to high intensity of service, high risk for further deterioration and high frequency of surveillance required.*  Author: Hillary Bow., DO 08/13/2022 10:50 PM  For on call review [email or web address].

## 2022-08-13 NOTE — ED Notes (Signed)
Date and time results received: 08/13/22 1652 (use smartphrase ".now" to insert current time)  Test: Lactic Acid Critical Value: 2.3  Name of Provider Notified: Dr. Theresia Lo  Orders Received? Or Actions Taken?:

## 2022-08-13 NOTE — Progress Notes (Addendum)
Office Visit Note   Patient: Kathryn Beck           Date of Birth: 09/29/1955           MRN: [id number] Visit Date: 08/13/2022              Requested by: [name], MD [address], [address] [place],  [place] [address] PCP: [name], MD  Chief Complaint  Patient presents with   Right Leg - Routine Post Op    05/29/2022 right BKA      HPI: Patient is a 67 year old woman who is 2 months status post right below-knee amputation she is using her stump shrinker and limb protector.  Assessment & Plan: Visit Diagnoses:  1. Right below-knee amputee [facility])     Plan: Patient will follow-up with [facility] for prosthetic fitting.  Follow-up with therapy upstairs for gait training.  Follow-Up Instructions: Return in about 3 months (around 11/13/2022).   Ortho Exam  Patient is alert, oriented, no adenopathy, well-dressed, normal affect, normal respiratory effort. Examination incision is well-healed the limb is consolidated there is minimal swelling no ulcers no drainage.  Patient is a new right transtibial  amputee.  Patient's current comorbidities are not expected to impact the ability to function with the prescribed prosthesis. Patient verbally communicates a strong desire to use a prosthesis. Patient currently requires mobility aids to ambulate without a prosthesis.  Expects not to use mobility aids with a new prosthesis.  Patient is a K2 level ambulator that will use a prosthesis to walk around their home and the community over low level environmental barriers.     Imaging: No results found. No images are attached to the encounter.  Labs: Lab Results  Component Value Date   HGBA1C 6.3 (H) 07/07/2022   HGBA1C 9.3 (A) 04/16/2022   HGBA1C 8.9 (A) 10/28/2021   ESRSEDRATE 37 (H) 04/25/2022   ESRSEDRATE 27 (H) 04/24/2008   CRP 29.4 (H) 04/25/2022   CRP 20.1 (H) 12/02/2020   CRP 6.5 (H) 04/07/2019   LABURIC 1.8 (L) 06/07/2018   LABURIC 6.2 04/22/2018    LABURIC 9.3 (H) 10/07/2015   REPTSTATUS 11/09/2017 FINAL 11/07/2017   GRAMSTAIN No WBC Seen 04/03/2014   GRAMSTAIN Rare Squamous Epithelial Cells Present 04/03/2014   GRAMSTAIN Few Gram Positive Cocci In Pairs 04/03/2014   GRAMSTAIN Few Gram Negative Rods 04/03/2014   CULT (A) 11/07/2017    <10,000 COLONIES/mL INSIGNIFICANT GROWTH Performed at [facility], [address] [place], [place] [address]    LABORGA Normal Oropharyngeal Flora 04/03/2014     Lab Results  Component Value Date   ALBUMIN 2.8 (L) 07/20/2022   ALBUMIN 2.9 (L) 07/19/2022   ALBUMIN 3.8 (L) 07/07/2022   PREALBUMIN 24 04/25/2022    Lab Results  Component Value Date   MG 2.0 07/19/2022   MG 1.9 04/30/2022   MG 2.3 04/29/2022   Lab Results  Component Value Date   VD25OH 30.9 09/28/2017   VD25OH 15 (L) 10/07/2015   VD25OH 16 (L) 04/25/2015    Lab Results  Component Value Date   PREALBUMIN 24 04/25/2022      Latest Ref Rng & Units 07/20/2022    3:02 AM 07/19/2022    2:35 AM 07/18/2022   11:35 AM  CBC EXTENDED  WBC 4.0 - 10.5 K/uL 7.4  7.1  9.9   RBC 3.87 - 5.11 MIL/uL 3.48  3.49  3.68   Hemoglobin 12.0 - 15.0 g/dL 8.7  8.9  9.2   HCT 36.0 - 46.0 % 28.9  28.5  30.4   Platelets 150 - 400 K/uL 318  295  333   NEUT# 1.7 - 7.7 K/uL   7.4   Lymph# 0.7 - 4.0 K/uL   1.5      There is no height or weight on file to calculate BMI.  Orders:  No orders of the defined types were placed in this encounter.  No orders of the defined types were placed in this encounter.    Procedures: No procedures performed  Clinical Data: No additional findings.  ROS:  All other systems negative, except as noted in the HPI. Review of Systems  Objective: Vital Signs: There were no vitals taken for this visit.  Specialty Comments:  No specialty comments available.  PMFS History: Patient Active Problem List   Diagnosis Date Noted   Peripheral vascular complication 07/21/2022   PVD (peripheral vascular  disease) (HCC) 07/18/2022   Toe ulcer, left, limited to breakdown of skin (HCC) 07/13/2022   S/P BKA (below knee amputation), right (HCC) 05/29/2022   Confusion 05/19/2022   Visual hallucinations 05/19/2022   Watery eyes 05/19/2022   Requires assistance with activities of daily living (ADL) 04/26/2022   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   Gangrene of right foot (HCC) 04/25/2022   PAD (peripheral artery disease) (HCC) 04/25/2022   Other osteoporosis without current pathological fracture 11/10/2021   Hypocalcemia 11/10/2021   Fall 11/02/2021   Left foot pain 10/31/2021   Low back pain with left-sided sciatica 10/31/2021   Left ear impacted cerumen 09/18/2021   Chronic left shoulder pain 06/10/2021   Abnormal CXR 03/10/2021   Ear pain, bilateral 03/10/2021   Morbid obesity (HCC) 03/10/2021   Subungual hematoma of second toe of left foot 10/28/2020   Insomnia 10/28/2020   Memory loss or impairment 09/26/2020   Forgetfulness 09/26/2020   Recurrent vertigo 09/26/2020   Abnormal MRI, cervical spine 03/04/2020   Right leg weakness 02/28/2020   Knee pain, right 10/23/2019   Cough 08/09/2019   Carpal tunnel syndrome 06/13/2019   Chronic pain syndrome 06/13/2019   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) 06/13/2019   Neck pain 03/23/2019   Near syncope 12/05/2018   ESRD on hemodialysis (HCC) 11/22/2018   Ischemic heart disease 09/25/2018   Not currently working due to disabled status 06/13/2018   HTN (hypertension) 06/12/2018   Depression 03/09/2018   Adrenal mass, left (HCC) 11/09/2017   MGUS (monoclonal gammopathy of unknown significance) 11/05/2017   Shoulder pain 11/01/2017   Chronic diastolic (congestive) heart failure (HCC) 09/20/2017   Bilateral leg weakness 09/09/2017   Adrenal adenoma 09/03/2017   Uncontrolled type 2 diabetes mellitus 08/18/2017   Unsteady gait 07/11/2017   Recurrent falls 07/11/2017   Anemia of chronic disease  03/24/2017   Personal history of colonic polyps 02/10/2017   Dysphagia 11/05/2016   Irritable bowel syndrome 02/04/2016   Hospital discharge follow-up 10/27/2015   Intolerant of heat 10/07/2014   Cardiac murmur 01/17/2014   Back pain with left-sided radiculopathy 09/18/2013   Seasonal allergies 03/10/2013   Lipoma of back 12/22/2012   Other seasonal allergic rhinitis 08/22/2012   OSA (obstructive sleep apnea) 06/02/2011   Chronic kidney disease, stage 4 (severe) (HCC) 01/13/2011   Neuropathy 04/16/2010   Malaise and fatigue 07/24/2009   Personal history of other diseases of the nervous system and sense organs 05/08/2009   LUPUS ERYTHEMATOSUS, DISCOID 06/05/2008   Arm  pain 05/30/2008   Hyperlipidemia 06/07/2007   Obesity 06/07/2007   Malignant hypertension 06/07/2007   Type 2 diabetes mellitus with hyperglycemia (HCC) 06/07/2007   Diabetes mellitus (HCC) 06/07/2007   Past Medical History:  Diagnosis Date   Acid reflux    Anemia    Arthritis    Axillary masses    Soft tissue - status post excision   Back pain    COVID-19 virus infection 04/06/2019   Depression    End-stage renal disease (HCC)    M/W/F dialysis   Essential hypertension    Headache    years ago   History of blood transfusion    History of cardiac catheterization    Normal coronary arteries October 2020   History of claustrophobia    History of pneumonia 2019   Hypoxia 04/03/2019   Memory loss    Mixed hyperlipidemia    Obesity    Pancreatitis    Peritoneal dialysis catheter in place [facility])    Pneumonia due to COVID-19 virus 04/02/2019   Sleep apnea    Noncompliant with CPAP   Stroke (HCC)    mini stroke   Type 2 diabetes mellitus (HCC)     Family History  Problem Relation Age of Onset   Hypertension Father    Hypercholesterolemia Father    Arthritis Father    Hypertension Sister    Hypercholesterolemia Sister    Breast cancer Sister    Hypertension Sister    Colon cancer Neg Hx    Colon  polyps Neg Hx     Past Surgical History:  Procedure Laterality Date   ABDOMINAL AORTOGRAM W/LOWER EXTREMITY N/A 04/30/2022   Procedure: ABDOMINAL AORTOGRAM W/LOWER EXTREMITY;  Surgeon: Leonie Douglas, MD;  Location: [facility]VASIVE [facility] LAB;  Service: Cardiovascular;  Laterality: N/A;   ABDOMINAL AORTOGRAM W/LOWER EXTREMITY N/A 07/21/2022   Procedure: ABDOMINAL AORTOGRAM W/LOWER EXTREMITY;  Surgeon: Nada Libman, MD;  Location: [facility];  Service: Cardiovascular;  Laterality: N/A;   ABDOMINAL HYSTERECTOMY     AMPUTATION Right 05/29/2022   Procedure: RIGHT BELOW THE KNEE AMPUTATION;  Surgeon: [name], MD;  Location: [facility] OR;  Service: Orthopedics;  Laterality: Right;   AV FISTULA PLACEMENT Left 09/02/2017   Procedure: creation of left arm ARTERIOVENOUS (AV) FISTULA;  Surgeon: Nada Libman, MD;  Location: [facility] OR;  Service: Vascular;  Laterality: Left;   COLONOSCOPY  2008   Dr. Darrick Penna: normal    COLONOSCOPY N/A 12/18/2016   Dr. Darrick Penna: multiple tubular adenomas, internal hemorrhoids. Surveillance in 3 years    ESOPHAGEAL DILATION N/A 10/13/2015   Procedure: ESOPHAGEAL DILATION;  Surgeon: Malissa Hippo, MD;  Location: [facility];  Service: Endoscopy;  Laterality: N/A;   ESOPHAGOGASTRODUODENOSCOPY N/A 10/13/2015   Dr. Karilyn Cota: chronic gastritis on path, no H.pylori. Empiric dilation    ESOPHAGOGASTRODUODENOSCOPY N/A 12/18/2016   Dr. Darrick Penna: mild gastritis. BRAVO study revealed uncontrolled GERD. Dysphagia secondary to uncontrolled reflux   FOOT SURGERY Bilateral    "nerve"     LEFT HEART CATH AND CORONARY ANGIOGRAPHY N/A 12/29/2018   Procedure: LEFT HEART CATH AND CORONARY ANGIOGRAPHY;  Surgeon: Corky Crafts, MD;  Location: [facility] INVASIVE CV LAB;  Service: Cardiovascular;  Laterality: N/A;   LOWER EXTREMITY ANGIOGRAPHY Right 05/04/2022   Procedure: Lower Extremity Angiography;  Surgeon: Victorino Sparrow, MD;  Location: [facility] INVASIVE CV LAB;  Service: Cardiovascular;  Laterality:  Right;   LUNG BIOPSY     MASS EXCISION Right 01/09/2013  Procedure: EXCISION OF NEOPLASM OF RIGHT  AXILLA  AND EXCISION OF NEOPLASM OF LEFT AXILLA;  Surgeon: Dalia Heading, MD;  Location: [facility] ORS;  Service: General;  Laterality: Right;  procedure end @ 08:23   MYRINGOTOMY WITH TUBE PLACEMENT Bilateral 04/28/2017   Procedure: BILATERAL MYRINGOTOMY WITH TUBE PLACEMENT;  Surgeon: Newman Pies, MD;  Location: [facility] OR;  Service: ENT;  Laterality: Bilateral;   PERIPHERAL VASCULAR BALLOON ANGIOPLASTY Right 05/04/2022   Procedure: PERIPHERAL VASCULAR BALLOON ANGIOPLASTY;  Surgeon: Victorino Sparrow, MD;  Location: [facility] INVASIVE CV LAB;  Service: Cardiovascular;  Laterality: Right;  PT   PERIPHERAL VASCULAR INTERVENTION Right 05/04/2022   Procedure: PERIPHERAL VASCULAR INTERVENTION;  Surgeon: Victorino Sparrow, MD;  Location: [facility] INVASIVE CV LAB;  Service: Cardiovascular;  Laterality: Right;  SFA   PERIPHERAL VASCULAR INTERVENTION Left 07/21/2022   Procedure: PERIPHERAL VASCULAR INTERVENTION;  Surgeon: Nada Libman, MD;  Location: [facility];  Service: Cardiovascular;  Laterality: Left;   REVISION OF ARTERIOVENOUS GORETEX GRAFT Left 05/04/2018   Procedure: TRANSPOSITION OF CEPHALIC VEIN ARTERIOVENOUS FISTULA LEFT ARM;  Surgeon: Larina Earthly, MD;  Location: [facility] OR;  Service: Vascular;  Laterality: Left;   SAVORY DILATION N/A 12/18/2016   Procedure: SAVORY DILATION;  Surgeon: West Bali, MD;  Location: [facility];  Service: Endoscopy;  Laterality: N/A;   Social History   Occupational History   Occupation: retired   Tobacco Use   Smoking status: Never    Passive exposure: Never   Smokeless tobacco: Never   Tobacco comments:    Verified by Daughter, [name]  Vaping Use   Vaping Use: Never used  Substance and Sexual Activity   Alcohol use: No   Drug use: No   Sexual activity: Not Currently    Partners: Male

## 2022-08-13 NOTE — Assessment & Plan Note (Signed)
Cont home lyrica and percocet

## 2022-08-13 NOTE — ED Notes (Signed)
ED TO INPATIENT HANDOFF REPORT  ED Nurse Name and Phone #: Elnita Maxwell [phone]   S Name/Age/Gender Kathryn Beck 67 y.o. female Room/Bed: 022C/022C  Code Status   Code Status: Full Code  Home/SNF/Other Home Patient oriented to: self, place, time, and situation Is this baseline? Yes   Triage Complete: Triage complete  Chief Complaint Acute osteomyelitis of toe of left foot [facility]) [M86.172]  Triage Note Pt reports that she had an appt with MD Ardelle Anton at 1300 today for an infection to her toes on her left foot and was sent here to be admitted for surgery.    Allergies Allergies  Allergen Reactions   Ace Inhibitors Anaphylaxis and Swelling   Penicillins Itching, Swelling and Other (See Comments)    Did it involve swelling of the face/tongue/throat, SOB, or low BP? Unknown Did it involve sudden or severe rash/hives, skin peeling, or any reaction on the inside of your mouth or nose? Unknown Did you need to seek medical attention at a hospital or doctor's office? Unknown When did it last happen?      years  If all above answers are "NO", may proceed with cephalosporin use.    Statins Other (See Comments)    elevated LFT's     Albuterol Swelling    Level of Care/Admitting Diagnosis ED Disposition     ED Disposition  Admit   Condition  --   Comment  Hospital Area: [facility] [[id number]]  Level of Care: Med-Surg [[id number]]  May admit patient to [facility] or [facility] if equivalent level of care is available:: No  Covid Evaluation: Asymptomatic - no recent exposure (last 10 days) testing not required  Diagnosis: Acute osteomyelitis of toe of left foot [facility]) [[id number]]  Admitting Physician: Hillary Bow [[id number]]  Attending Physician: Hillary Bow [phone]  Certification:: I certify this patient is being admitted for an inpatient-only procedure  Estimated Length of Stay: 4          B Medical/Surgery History Past Medical History:  Diagnosis Date    Acid reflux    Anemia    Arthritis    Axillary masses    Soft tissue - status post excision   Back pain    COVID-19 virus infection 04/06/2019   Depression    End-stage renal disease (HCC)    M/W/F dialysis   Essential hypertension    Headache    years ago   History of blood transfusion    History of cardiac catheterization    Normal coronary arteries October 2020   History of claustrophobia    History of pneumonia 2019   Hypoxia 04/03/2019   Memory loss    Mixed hyperlipidemia    Obesity    Pancreatitis    Peritoneal dialysis catheter in place [facility])    Pneumonia due to COVID-19 virus 04/02/2019   Sleep apnea    Noncompliant with CPAP   Stroke (HCC)    mini stroke   Type 2 diabetes mellitus (HCC)    Past Surgical History:  Procedure Laterality Date   ABDOMINAL AORTOGRAM W/LOWER EXTREMITY N/A 04/30/2022   Procedure: ABDOMINAL AORTOGRAM W/LOWER EXTREMITY;  Surgeon: Leonie Douglas, MD;  [facility]: [facility] INVASIVE CV LAB;  Service: Cardiovascular;  Laterality: N/A;   ABDOMINAL AORTOGRAM W/LOWER EXTREMITY N/A 07/21/2022   Procedure: ABDOMINAL AORTOGRAM W/LOWER EXTREMITY;  Surgeon: Nada Libman, MD;  Location: [facility] INVASIVE CV LAB;  Service: Cardiovascular;  Laterality: N/A;   ABDOMINAL HYSTERECTOMY     AMPUTATION  Right 05/29/2022   Procedure: RIGHT BELOW THE KNEE AMPUTATION;  Surgeon: [name], MD;  Location: [facility] OR;  Service: Orthopedics;  Laterality: Right;   AV FISTULA PLACEMENT Left 09/02/2017   Procedure: creation of left arm ARTERIOVENOUS (AV) FISTULA;  Surgeon: Nada Libman, MD;  Location: [facility] OR;  Service: Vascular;  Laterality: Left;   COLONOSCOPY  2008   Dr. Darrick Penna: normal    COLONOSCOPY N/A 12/18/2016   Dr. Darrick Penna: multiple tubular adenomas, internal hemorrhoids. Surveillance in 3 years    ESOPHAGEAL DILATION N/A 10/13/2015   Procedure: ESOPHAGEAL DILATION;  Surgeon: Malissa Hippo, MD;  Location: [facility] ENDO SU[facility];  Service: Endoscopy;  Laterality: N/A;    ESOPHAGOGASTRODUODENOSCOPY N/A 10/13/2015   Dr. Karilyn Cota: chronic gastritis on path, no H.pylori. Empiric dilation    ESOPHAGOGASTRODUODENOSCOPY N/A 12/18/2016   Dr. Darrick Penna: mild gastritis. BRAVO study revealed uncontrolled GERD. Dysphagia secondary to uncontrolled reflux   FOOT SURGERY Bilateral    "nerve"     LEFT HEART CATH AND CORONARY ANGIOGRAPHY N/A 12/29/2018   Procedure: LEFT HEART CATH AND CORONARY ANGIOGRAPHY;  Surgeon: Corky Crafts, MD;  Location: [facility] INVASIVE CV LAB;  Service: Cardiovascular;  Laterality: N/A;   LOWER EXTREMITY ANGIOGRAPHY Right 05/04/2022   Procedure: Lower Extremity Angiography;  Surgeon: Victorino Sparrow, MD;  Location: [facility] INVASIVE CV LAB;  Service: Cardiovascular;  Laterality: Right;   LUNG BIOPSY     MASS EXCISION Right 01/09/2013   Procedure: EXCISION OF NEOPLASM OF RIGHT  AXILLA  AND EXCISION OF NEOPLASM OF LEFT AXILLA;  Surgeon: Dalia Heading, MD;  Location: [facility] ORS;  Service: General;  Laterality: Right;  procedure end @ 08:23   MYRINGOTOMY WITH TUBE PLACEMENT Bilateral 04/28/2017   Procedure: BILATERAL MYRINGOTOMY WITH TUBE PLACEMENT;  Surgeon: Newman Pies, MD;  Location: [facility] OR;  Service: ENT;  Laterality: Bilateral;   PERIPHERAL VASCULAR BALLOON ANGIOPLASTY Right 05/04/2022   Procedure: PERIPHERAL VASCULAR BALLOON ANGIOPLASTY;  Surgeon: Victorino Sparrow, MD;  Location: [facility] INVASIVE CV LAB;  Service: Cardiovascular;  Laterality: Right;  PT   PERIPHERAL VASCULAR INTERVENTION Right 05/04/2022   Procedure: PERIPHERAL VASCULAR INTERVENTION;  Surgeon: Victorino Sparrow, MD;  Location: [facility] INVASIVE CV LAB;  Service: Cardiovascular;  Laterality: Right;  SFA   PERIPHERAL VASCULAR INTERVENTION Left 07/21/2022   Procedure: PERIPHERAL VASCULAR INTERVENTION;  Surgeon: Nada Libman, MD;  [facility];  Service: Cardiovascular;  Laterality: Left;   REVISION OF ARTERIOVENOUS GORETEX GRAFT Left 05/04/2018   Procedure: TRANSPOSITION OF CEPHALIC VEIN ARTERIOVENOUS  FISTULA LEFT ARM;  Surgeon: Larina Earthly, MD;  Location: [facility] OR;  Service: Vascular;  Laterality: Left;   SAVORY DILATION N/A 12/18/2016   Procedure: SAVORY DILATION;  Surgeon: West Bali, MD;  [facility];  Service: Endoscopy;  Laterality: N/A;     A IV Location/Drains/Wounds Patient Lines/Drains/Airways Status     Active Line/Drains/Airways     Name Placement date Placement time Site Days   Peripheral IV 08/13/22 20 G Posterior;Right Hand 08/13/22  2011  Hand  less than 1   Fistula / Graft Left Upper arm Arteriovenous fistula 09/02/17  1150  Upper arm  1806   Wound / Incision (Open or Dehisced) 04/24/22 Non-pressure wound;Diabetic ulcer Toe (Comment  which one) Anterior;Right Black Eschar. unable to view 04/24/22  2321  Toe (Comment  which one)  111            Intake/Output Last 24 hours  Intake/Output Summary (Last 24 hours)  at 08/13/2022 2226 Last data filed at 08/13/2022 2052 Gross per 24 hour  Intake 100 ml  Output --  Net 100 ml    Labs/Imaging Results for orders placed or performed during the hospital encounter of 08/13/22 (from the past 48 hour(s))  Lactic acid, plasma     Status: Abnormal   Collection Time: 08/13/22  2:52 PM  Result Value Ref Range   Lactic Acid, Venous 2.3 (HH) 0.5 - 1.9 mmol/L    Comment: RESULT CALLED TO, READ BACK BY AND VERIFIED WITH Vidal Schwalbe, RN @ [phone] 08/13/22 BY [facility] Performed at [facility] Lab, [address] [place], [place] [address]   Comprehensive metabolic panel     Status: Abnormal   Collection Time: 08/13/22  2:52 PM  Result Value Ref Range   Sodium 140 135 - 145 mmol/L   Potassium 4.0 3.5 - 5.1 mmol/L   Chloride 101 98 - 111 mmol/L   CO2 26 22 - 32 mmol/L   Glucose, Bld 265 (H) 70 - 99 mg/dL    Comment: Glucose reference range applies only to samples taken after fasting for at least 8 hours.   BUN 22 8 - 23 mg/dL   Creatinine, Ser 7.82 (H) 0.44 - 1.00 mg/dL   Calcium 9.1 8.9 - 95.6 mg/dL   Total  Protein 7.6 6.5 - 8.1 g/dL   Albumin 3.1 (L) 3.5 - 5.0 g/dL   AST 27 15 - 41 U/L   ALT 28 0 - 44 U/L   Alkaline Phosphatase 112 38 - 126 U/L   Total Bilirubin 0.4 0.3 - 1.2 mg/dL   GFR, Estimated 9 (L) >60 mL/min    Comment: (NOTE) Calculated using the CKD-EPI Creatinine Equation (2021)    Anion gap 13 5 - 15    Comment: Performed at [facility] Lab, [address], [place], [place] [address]  CBC with Differential     Status: Abnormal   Collection Time: 08/13/22  2:52 PM  Result Value Ref Range   WBC 9.3 4.0 - 10.5 K/uL   RBC 3.80 (L) 3.87 - 5.11 MIL/uL   Hemoglobin 9.2 (L) 12.0 - 15.0 g/dL   HCT 65.7 (L) 84.6 - 96.2 %   MCV 81.3 80.0 - 100.0 fL   MCH 24.2 (L) 26.0 - 34.0 pg   MCHC 29.8 (L) 30.0 - 36.0 g/dL   RDW 95.2 (H) 84.1 - 32.4 %   Platelets 315 150 - 400 K/uL   nRBC 0.3 (H) 0.0 - 0.2 %   Neutrophils Relative % 66 %   Neutro Abs 6.3 1.7 - 7.7 K/uL   Lymphocytes Relative 21 %   Lymphs Abs 1.9 0.7 - 4.0 K/uL   Monocytes Relative 9 %   Monocytes Absolute 0.8 0.1 - 1.0 K/uL   Eosinophils Relative 2 %   Eosinophils Absolute 0.2 0.0 - 0.5 K/uL   Basophils Relative 1 %   Basophils Absolute 0.1 0.0 - 0.1 K/uL   Immature Granulocytes 1 %   Abs Immature Granulocytes 0.06 0.00 - 0.07 K/uL    Comment: Performed at [facility] Lab, [address], [place], [place] [address]  MRSA Next Gen by PCR, Nasal     Status: None   Collection Time: 08/13/22  8:02 PM   Specimen: Nasal Mucosa; Nasal Swab  Result Value Ref Range   MRSA by PCR Next Gen NOT DETECTED NOT DETECTED    Comment: (NOTE) The GeneXpert MRSA Assay (FDA approved for NASAL specimens  only), is one component of a comprehensive MRSA colonization surveillance program. It is not intended to diagnose MRSA infection nor to guide or monitor treatment for MRSA infections. Test performance is not FDA approved in patients less than 88 years old. Performed at [facility] Lab, [address] [place],  [place] [address]   Sedimentation rate     Status: Abnormal   Collection Time: 08/13/22  8:26 PM  Result Value Ref Range   Sed Rate 79 (H) 0 - 22 mm/hr    Comment: Performed at [facility] Lab, [address] [place], [place] [address]  C-reactive protein     Status: Abnormal   Collection Time: 08/13/22  8:26 PM  Result Value Ref Range   CRP 9.1 (H) <1.0 mg/dL    Comment: Performed at [facility] Lab, [address], [place], [place] [address]  Prealbumin     Status: None   Collection Time: 08/13/22  8:26 PM  Result Value Ref Range   Prealbumin 26 18 - 38 mg/dL    Comment: Performed at [facility] Lab, [address], [place], [place] [address]   *Note: Due to a large number of results and/or encounters for the requested time period, some results have not been displayed. A complete set of results can be found in Results Review.   DG Foot Complete Left  Result Date: 08/13/2022 Please see detailed radiograph report in office note.  DG Chest 2 View  Result Date: 08/13/2022 CLINICAL DATA:  Toe infection. EXAM: CHEST - 2 VIEW COMPARISON:  Chest radiograph 04/24/2022 FINDINGS: The cardiac silhouette remains mildly enlarged. Lung volumes are low. No acute airspace opacity, edema, sizable pleural effusion, or pneumothorax is identified. Scarring is again noted in the left mid to lower lung. No acute osseous abnormality is seen. IMPRESSION: No active cardiopulmonary disease. Electronically Signed   By: Sebastian Ache M.D.   On: 08/13/2022 15:28    Pending Labs Unresulted Labs (From admission, onward)     Start     Ordered   08/14/22 0500  CBC  Tomorrow morning,   R        08/13/22 2014   08/14/22 0500  Basic metabolic panel  Tomorrow morning,   R        08/13/22 2014   08/13/22 1907  Blood culture (routine x 2)  BLOOD CULTURE X 2,   R (with STAT occurrences)      08/13/22 1909            Vitals/Pain Today's Vitals   08/13/22 1930 08/13/22 2020 08/13/22 2055 08/13/22 2100  BP:  (!) 166/70 (!) 177/77  (!) 162/70  Pulse: 80 82  80  Resp: 19 20  20   Temp:   97.6 F (36.4 C)   TempSrc:   Oral   SpO2: 100% 100%  95%  PainSc:        Isolation Precautions No active isolations  Medications Medications  cefTRIAXone (ROCEPHIN) 2 g in sodium chloride 0.9 % 100 mL IVPB (0 g Intravenous Stopped 08/13/22 2052)  metroNIDAZOLE (FLAGYL) tablet 500 mg (500 mg Oral Given 08/13/22 2149)  vancomycin (VANCOREADY) IVPB 1750 mg/350 mL (1,750 mg Intravenous New Bag/Given 08/13/22 2054)  vancomycin (VANCOCIN) IVPB 1000 mg/200 mL premix (has no administration in time range)  heparin injection 5,000 Units (5,000 Units Subcutaneous Given 08/13/22 2150)  acetaminophen (TYLENOL) tablet 650 mg (has no administration in time range)    Or  acetaminophen (TYLENOL) suppository 650 mg (  has no administration in time range)  ondansetron (ZOFRAN) tablet 4 mg (has no administration in time range)    Or  ondansetron (ZOFRAN) injection 4 mg (has no administration in time range)  aspirin chewable tablet 81 mg (has no administration in time range)  ketotifen (ZADITOR) 0.035 % ophthalmic solution 1 drop (has no administration in time range)  calcitRIOL (ROCALTROL) capsule 0.25 mcg (has no administration in time range)  DULoxetine (CYMBALTA) DR capsule 60 mg (has no administration in time range)  ezetimibe (ZETIA) tablet 10 mg (has no administration in time range)  furosemide (LASIX) tablet 80 mg (has no administration in time range)  insulin glargine (2 Unit Dial) (TOUJEO MAX) Solostar Pen SOPN 30 Units (has no administration in time range)  metoprolol succinate (TOPROL-XL) 24 hr tablet 50 mg (has no administration in time range)  hydrOXYzine (ATARAX) tablet 25 mg (has no administration in time range)  oxyCODONE-acetaminophen (PERCOCET/ROXICET) 5-325 MG per tablet 1 tablet (has no administration in time range)  sucroferric oxyhydroxide ([name]) chewable tablet 500-1,000 mg (has no administration in  time range)  sertraline (ZOLOFT) tablet 50 mg (has no administration in time range)  pregabalin (LYRICA) capsule 50 mg (has no administration in time range)  insulin aspart (novoLOG) injection 0-9 Units (has no administration in time range)  insulin aspart (novoLOG) injection 3 Units (has no administration in time range)    Mobility manual wheelchair     R Recommendations: See Admitting Provider Note  Report given to:   Additional Notes: A&O x4, pt does talk in her sleep while at rest, husband a the bedside, possible sx tomorrow on her left foot d/t toe infection, Pt is a dialysis pt, left arm restricted. Pt has eaten dinner tonight (outside food), does not produce much urine. Pleasant lady.

## 2022-08-13 NOTE — Assessment & Plan Note (Signed)
Call nephro in AM for routine IP dialysis during stay.

## 2022-08-14 ENCOUNTER — Other Ambulatory Visit: Payer: Self-pay | Admitting: Orthopaedic Surgery

## 2022-08-14 ENCOUNTER — Other Ambulatory Visit: Payer: Self-pay | Admitting: Family Medicine

## 2022-08-14 DIAGNOSIS — M86172 Other acute osteomyelitis, left ankle and foot: Secondary | ICD-10-CM | POA: Diagnosis not present

## 2022-08-14 DIAGNOSIS — E1165 Type 2 diabetes mellitus with hyperglycemia: Secondary | ICD-10-CM | POA: Diagnosis not present

## 2022-08-14 DIAGNOSIS — I739 Peripheral vascular disease, unspecified: Secondary | ICD-10-CM | POA: Diagnosis not present

## 2022-08-14 DIAGNOSIS — M545 Low back pain, unspecified: Secondary | ICD-10-CM

## 2022-08-14 DIAGNOSIS — Z794 Long term (current) use of insulin: Secondary | ICD-10-CM | POA: Diagnosis not present

## 2022-08-14 LAB — RENAL FUNCTION PANEL
Albumin: 3 g/dL — ABNORMAL LOW (ref 3.5–5.0)
Anion gap: 15 (ref 5–15)
BUN: 31 mg/dL — ABNORMAL HIGH (ref 8–23)
CO2: 25 mmol/L (ref 22–32)
Calcium: 9.1 mg/dL (ref 8.9–10.3)
Chloride: 101 mmol/L (ref 98–111)
Creatinine, Ser: 6.27 mg/dL — ABNORMAL HIGH (ref 0.44–1.00)
GFR, Estimated: 7 mL/min — ABNORMAL LOW (ref >60)
Glucose, Bld: 140 mg/dL — ABNORMAL HIGH (ref 70–99)
Phosphorus: 4.9 mg/dL — ABNORMAL HIGH (ref 2.5–4.6)
Potassium: 4.2 mmol/L (ref 3.5–5.1)
Sodium: 141 mmol/L (ref 135–145)

## 2022-08-14 LAB — CULTURE, BLOOD (ROUTINE X 2)
Culture: NO GROWTH
Special Requests: ADEQUATE

## 2022-08-14 LAB — GLUCOSE, CAPILLARY
Glucose-Capillary: 150 mg/dL — ABNORMAL HIGH (ref 70–99)
Glucose-Capillary: 129 mg/dL — ABNORMAL HIGH (ref 70–99)
Glucose-Capillary: 168 mg/dL — ABNORMAL HIGH (ref 70–99)
Glucose-Capillary: 198 mg/dL — ABNORMAL HIGH (ref 70–99)

## 2022-08-14 LAB — LACTIC ACID, PLASMA: Lactic Acid, Venous: 1.5 mmol/L (ref 0.5–1.9)

## 2022-08-14 LAB — CBC
HCT: 29.5 % — ABNORMAL LOW (ref 36.0–46.0)
Hemoglobin: 8.7 g/dL — ABNORMAL LOW (ref 12.0–15.0)
MCH: 23.8 pg — ABNORMAL LOW (ref 26.0–34.0)
MCHC: 29.5 g/dL — ABNORMAL LOW (ref 30.0–36.0)
MCV: 80.8 fL (ref 80.0–100.0)
Platelets: 301 10*3/uL (ref 150–400)
RBC: 3.65 MIL/uL — ABNORMAL LOW (ref 3.87–5.11)
RDW: 18 % — ABNORMAL HIGH (ref 11.5–15.5)
WBC: 8.7 10*3/uL (ref 4.0–10.5)
nRBC: 0.2 % (ref 0.0–0.2)

## 2022-08-14 MED ORDER — PENTAFLUOROPROP-TETRAFLUOROETH EX AERO: 1.0000 | INHALATION_SPRAY | CUTANEOUS | Status: DC | PRN

## 2022-08-14 MED ORDER — HEPARIN SODIUM (PORCINE) 1000 UNIT/ML DIALYSIS: 40.0000 [IU]/kg | INTRAMUSCULAR | Status: DC | PRN

## 2022-08-14 MED ORDER — JUVEN PO PACK
1.0000 | PACK | Freq: Two times a day (BID) | ORAL | Status: DC
  Administered 2022-08-16: 1 via ORAL
  Filled 2022-08-14 (×2): qty 1

## 2022-08-14 MED ORDER — NEPRO/CARBSTEADY PO LIQD
237.0000 mL | ORAL | Status: DC
  Administered 2022-08-14 – 2022-08-20 (×6): 237 mL via ORAL

## 2022-08-14 MED ORDER — LIDOCAINE-PRILOCAINE 2.5-2.5 % EX CREA: 1.0000 | TOPICAL_CREAM | CUTANEOUS | Status: DC | PRN

## 2022-08-14 MED ORDER — ASPIRIN 81 MG PO CHEW
162.0000 mg | CHEWABLE_TABLET | Freq: Every day | ORAL | Status: DC
  Administered 2022-08-16 – 2022-08-20 (×5): 162 mg via ORAL
  Filled 2022-08-14 (×5): qty 2

## 2022-08-14 MED ORDER — LIDOCAINE HCL (PF) 1 % IJ SOLN: 5.0000 mL | INTRAMUSCULAR | Status: DC | PRN

## 2022-08-14 MED ORDER — INSULIN GLARGINE-YFGN 100 UNIT/ML ~~LOC~~ SOLN
20.0000 [IU] | Freq: Every day | SUBCUTANEOUS | Status: DC
  Administered 2022-08-14: 20 [IU] via SUBCUTANEOUS
  Filled 2022-08-14 (×3): qty 0.2

## 2022-08-14 MED ORDER — VANCOMYCIN HCL IN DEXTROSE 1-5 GM/200ML-% IV SOLN
1000.0000 mg | INTRAVENOUS | Status: DC
  Filled 2022-08-14: qty 200

## 2022-08-14 MED ORDER — VANCOMYCIN HCL IN DEXTROSE 1-5 GM/200ML-% IV SOLN: 1000.0000 mg | INTRAVENOUS | Status: DC

## 2022-08-14 NOTE — Inpatient Diabetes Management (Signed)
Inpatient Diabetes Program Recommendations  AACE/ADA: New Consensus Statement on Inpatient Glycemic Control (2015)  Target Ranges:  Prepandial:   less than 140 mg/dL      Peak postprandial:   less than 180 mg/dL (1-2 hours)      Critically ill patients:  140 - 180 mg/dL   Lab Results  Component Value Date   GLUCAP 150 (H) 08/14/2022   HGBA1C 6.3 (H) 07/07/2022    Review of Glycemic Control  Latest Reference Range & Units 08/14/22 09:18  Glucose-Capillary 70 - 99 mg/dL 621 (H)  (H): Data is abnormally high  Latest Reference Range & Units 08/13/22 14:52  GFR, Estimated >60 mL/min 9 (L)  (L): Data is abnormally low  Diabetes history: DM2 Outpatient Diabetes medications: Toujeo 30 units QD, Fiasp 10-20 units TID, Freestyle Libre 2 CGM Current orders for Inpatient glycemic control: Semglee 30 units QD, Novolog 0-9 units TID and 3 units TID with meals   Left foot infection [id number]  Inpatient Diabetes Program Recommendations:    Semglee 15 units QD (50 % of home dose-will be NPO after MN) Novolog 0-6 units TID and 0-5 units QHS & then Q4H once NPO  Will continue to follow while inpatient.  Thank you, Dulce Sellar, MSN, CDCES Diabetes Coordinator Inpatient Diabetes Program [phone] (team pager from 8a-5p)

## 2022-08-14 NOTE — Consult Note (Signed)
Reason for Consult:*** Referring Physician: ***  Kathryn Beck is an 67 y.o. female.  HPI: ***  Past Medical History:  Diagnosis Date   Acid reflux    Anemia    Arthritis    Axillary masses    Soft tissue - status post excision   Back pain    COVID-19 virus infection 04/06/2019   Depression    End-stage renal disease (HCC)    M/W/F dialysis   Essential hypertension    Headache    years ago   History of blood transfusion    History of cardiac catheterization    Normal coronary arteries October 2020   History of claustrophobia    History of pneumonia 2019   Hypoxia 04/03/2019   Memory loss    Mixed hyperlipidemia    Obesity    Pancreatitis    Peritoneal dialysis catheter in place [facility])    Pneumonia due to COVID-19 virus 04/02/2019   Sleep apnea    Noncompliant with CPAP   Stroke (HCC)    mini stroke   Type 2 diabetes mellitus (HCC)     Past Surgical History:  Procedure Laterality Date   ABDOMINAL AORTOGRAM W/LOWER EXTREMITY N/A 04/30/2022   Procedure: ABDOMINAL AORTOGRAM W/LOWER EXTREMITY;  Surgeon: Leonie Douglas, MD;  [facility]: [facility];  Service: Cardiovascular;  Laterality: N/A;   ABDOMINAL AORTOGRAM W/LOWER EXTREMITY N/A 07/21/2022   Procedure: ABDOMINAL AORTOGRAM W/LOWER EXTREMITY;  Surgeon: Nada Libman, MD;  Location: [facility];  Service: Cardiovascular;  Laterality: N/A;   ABDOMINAL HYSTERECTOMY     AMPUTATION Right 05/29/2022   Procedure: RIGHT BELOW THE KNEE AMPUTATION;  Surgeon: [name], MD;  Location: [facility] OR;  Service: Orthopedics;  Laterality: Right;   AV FISTULA PLACEMENT Left 09/02/2017   Procedure: creation of left arm ARTERIOVENOUS (AV) FISTULA;  Surgeon: Nada Libman, MD;  Location: [facility] OR;  Service: Vascular;  Laterality: Left;   COLONOSCOPY  2008   Dr. Darrick Penna: normal    COLONOSCOPY N/A 12/18/2016   Dr. Darrick Penna: multiple tubular adenomas, internal hemorrhoids. Surveillance in 3 years    ESOPHAGEAL DILATION N/A  10/13/2015   Procedure: ESOPHAGEAL DILATION;  Surgeon: Malissa Hippo, MD;  Location: [facility];  Service: Endoscopy;  Laterality: N/A;   ESOPHAGOGASTRODUODENOSCOPY N/A 10/13/2015   Dr. Karilyn Cota: chronic gastritis on path, no H.pylori. Empiric dilation    ESOPHAGOGASTRODUODENOSCOPY N/A 12/18/2016   Dr. Darrick Penna: mild gastritis. BRAVO study revealed uncontrolled GERD. Dysphagia secondary to uncontrolled reflux   FOOT SURGERY Bilateral    "nerve"     LEFT HEART CATH AND CORONARY ANGIOGRAPHY N/A 12/29/2018   Procedure: LEFT HEART CATH AND CORONARY ANGIOGRAPHY;  Surgeon: Corky Crafts, MD;  Location: [facility] INVASIVE CV LAB;  Service: Cardiovascular;  Laterality: N/A;   LOWER EXTREMITY ANGIOGRAPHY Right 05/04/2022   Procedure: Lower Extremity Angiography;  Surgeon: Victorino Sparrow, MD;  Location: [facility] INVASIVE CV LAB;  Service: Cardiovascular;  Laterality: Right;   LUNG BIOPSY     MASS EXCISION Right 01/09/2013   Procedure: EXCISION OF NEOPLASM OF RIGHT  AXILLA  AND EXCISION OF NEOPLASM OF LEFT AXILLA;  Surgeon: Dalia Heading, MD;  Location: [facility] ORS;  Service: General;  Laterality: Right;  procedure end @ 08:23   MYRINGOTOMY WITH TUBE PLACEMENT Bilateral 04/28/2017   Procedure: BILATERAL MYRINGOTOMY WITH TUBE PLACEMENT;  Surgeon: Newman Pies, MD;  Location: [facility] OR;  Service: ENT;  Laterality: Bilateral;   PERIPHERAL VASCULAR BALLOON ANGIOPLASTY Right  05/04/2022   Procedure: PERIPHERAL VASCULAR BALLOON ANGIOPLASTY;  Surgeon: Victorino Sparrow, MD;  Location: [facility] INVASIVE CV LAB;  Service: Cardiovascular;  Laterality: Right;  PT   PERIPHERAL VASCULAR INTERVENTION Right 05/04/2022   Procedure: PERIPHERAL VASCULAR INTERVENTION;  Surgeon: Victorino Sparrow, MD;  Location: [facility] INVASIVE CV LAB;  Service: Cardiovascular;  Laterality: Right;  SFA   PERIPHERAL VASCULAR INTERVENTION Left 07/21/2022   Procedure: PERIPHERAL VASCULAR INTERVENTION;  Surgeon: Nada Libman, MD;  [facility];  Service:  Cardiovascular;  Laterality: Left;   REVISION OF ARTERIOVENOUS GORETEX GRAFT Left 05/04/2018   Procedure: TRANSPOSITION OF CEPHALIC VEIN ARTERIOVENOUS FISTULA LEFT ARM;  Surgeon: Larina Earthly, MD;  Location: [facility] OR;  Service: Vascular;  Laterality: Left;   SAVORY DILATION N/A 12/18/2016   Procedure: SAVORY DILATION;  Surgeon: West Bali, MD;  [facility];  Service: Endoscopy;  Laterality: N/A;    Family History  Problem Relation Age of Onset   Hypertension Father    Hypercholesterolemia Father    Arthritis Father    Hypertension Sister    Hypercholesterolemia Sister    Breast cancer Sister    Hypertension Sister    Colon cancer Neg Hx    Colon polyps Neg Hx     Social History:  reports that she has never smoked. She has never been exposed to tobacco smoke. She has never used smokeless tobacco. She reports that she does not drink alcohol and does not use drugs.  Allergies:  Allergies  Allergen Reactions   Ace Inhibitors Anaphylaxis and Swelling   Penicillins Itching, Swelling and Other (See Comments)    Did it involve swelling of the face/tongue/throat, SOB, or low BP? Unknown Did it involve sudden or severe rash/hives, skin peeling, or any reaction on the inside of your mouth or nose? Unknown Did you need to seek medical attention at a hospital or doctor's office? Unknown When did it last happen?      years  If all above answers are "NO", may proceed with cephalosporin use.    Statins Other (See Comments)    elevated LFT's     Albuterol Swelling    Medications: {medication reviewed/display:3041432}  Results for orders placed or performed during the hospital encounter of 08/13/22 (from the past 48 hour(s))  Lactic acid, plasma     Status: Abnormal   Collection Time: 08/13/22  2:52 PM  Result Value Ref Range   Lactic Acid, Venous 2.3 (HH) 0.5 - 1.9 mmol/L    Comment: RESULT CALLED TO, READ BACK BY AND VERIFIED WITH Vidal Schwalbe, RN @ [phone] 08/13/22 BY  [facility] Performed at [facility] Lab, [address] [place], [place] [address]   Comprehensive metabolic panel     Status: Abnormal   Collection Time: 08/13/22  2:52 PM  Result Value Ref Range   Sodium 140 135 - 145 mmol/L   Potassium 4.0 3.5 - 5.1 mmol/L   Chloride 101 98 - 111 mmol/L   CO2 26 22 - 32 mmol/L   Glucose, Bld 265 (H) 70 - 99 mg/dL    Comment: Glucose reference range applies only to samples taken after fasting for at least 8 hours.   BUN 22 8 - 23 mg/dL   Creatinine, Ser 4.09 (H) 0.44 - 1.00 mg/dL   Calcium 9.1 8.9 - 81.1 mg/dL   Total Protein 7.6 6.5 - 8.1 g/dL   Albumin 3.1 (L) 3.5 - 5.0 g/dL   AST 27 15 - 41  U/L   ALT 28 0 - 44 U/L   Alkaline Phosphatase 112 38 - 126 U/L   Total Bilirubin 0.4 0.3 - 1.2 mg/dL   GFR, Estimated 9 (L) >60 mL/min    Comment: (NOTE) Calculated using the CKD-EPI Creatinine Equation (2021)    Anion gap 13 5 - 15    Comment: Performed at [facility] Lab, [address], [place], [place] [address]  CBC with Differential     Status: Abnormal   Collection Time: 08/13/22  2:52 PM  Result Value Ref Range   WBC 9.3 4.0 - 10.5 K/uL   RBC 3.80 (L) 3.87 - 5.11 MIL/uL   Hemoglobin 9.2 (L) 12.0 - 15.0 g/dL   HCT 60.4 (L) 54.0 - 98.1 %   MCV 81.3 80.0 - 100.0 fL   MCH 24.2 (L) 26.0 - 34.0 pg   MCHC 29.8 (L) 30.0 - 36.0 g/dL   RDW 19.1 (H) 47.8 - 29.5 %   Platelets 315 150 - 400 K/uL   nRBC 0.3 (H) 0.0 - 0.2 %   Neutrophils Relative % 66 %   Neutro Abs 6.3 1.7 - 7.7 K/uL   Lymphocytes Relative 21 %   Lymphs Abs 1.9 0.7 - 4.0 K/uL   Monocytes Relative 9 %   Monocytes Absolute 0.8 0.1 - 1.0 K/uL   Eosinophils Relative 2 %   Eosinophils Absolute 0.2 0.0 - 0.5 K/uL   Basophils Relative 1 %   Basophils Absolute 0.1 0.0 - 0.1 K/uL   Immature Granulocytes 1 %   Abs Immature Granulocytes 0.06 0.00 - 0.07 K/uL    Comment: Performed at [facility] Lab, [address] [place], [place] [address]  Blood culture (routine x 2)     Status:  None (Preliminary result)   Collection Time: 08/13/22  7:53 PM   Specimen: BLOOD RIGHT HAND  Result Value Ref Range   Specimen Description BLOOD RIGHT HAND    Special Requests      BOTTLES DRAWN AEROBIC AND ANAEROBIC Blood Culture adequate volume   Culture      NO GROWTH < 12 HOURS Performed at [facility] Lab, [address], [place], [place] [address]    Report Status PENDING   MRSA Next Gen by PCR, Nasal     Status: None   Collection Time: 08/13/22  8:02 PM   Specimen: Nasal Mucosa; Nasal Swab  Result Value Ref Range   MRSA by PCR Next Gen NOT DETECTED NOT DETECTED    Comment: (NOTE) The GeneXpert MRSA Assay (FDA approved for NASAL specimens only), is one component of a comprehensive MRSA colonization surveillance program. It is not intended to diagnose MRSA infection nor to guide or monitor treatment for MRSA infections. Test performance is not FDA approved in patients less than 1 years old. Performed at [facility] Lab, [address], [place], [place] [address]   Sedimentation rate     Status: Abnormal   Collection Time: 08/13/22  8:26 PM  Result Value Ref Range   Sed Rate 79 (H) 0 - 22 mm/hr    Comment: Performed at [facility] Lab, [address], [place], [place] [address]  C-reactive protein     Status: Abnormal   Collection Time: 08/13/22  8:26 PM  Result Value Ref Range   CRP 9.1 (H) <1.0 mg/dL    Comment: Performed at [facility] Lab, [address] [place], [place] [address]  Prealbumin     Status:  None   Collection Time: 08/13/22  8:26 PM  Result Value Ref Range   Prealbumin 26 18 - 38 mg/dL    Comment: Performed at [facility] Lab, [address] [place], [place] [address]  CBC     Status: Abnormal   Collection Time: 08/14/22  8:41 AM  Result Value Ref Range   WBC 8.7 4.0 - 10.5 K/uL   RBC 3.65 (L) 3.87 - 5.11 MIL/uL   Hemoglobin 8.7 (L) 12.0 - 15.0 g/dL   HCT 60.4 (L) 54.0 - 98.1 %   MCV 80.8 80.0 - 100.0 fL   MCH 23.8 (L) 26.0 -  34.0 pg   MCHC 29.5 (L) 30.0 - 36.0 g/dL   RDW 19.1 (H) 47.8 - 29.5 %   Platelets 301 150 - 400 K/uL   nRBC 0.2 0.0 - 0.2 %    Comment: Performed at [facility] Lab, [address] [place], [place] [address]  Renal function panel     Status: Abnormal   Collection Time: 08/14/22  8:57 AM  Result Value Ref Range   Sodium 141 135 - 145 mmol/L   Potassium 4.2 3.5 - 5.1 mmol/L   Chloride 101 98 - 111 mmol/L   CO2 25 22 - 32 mmol/L   Glucose, Bld 140 (H) 70 - 99 mg/dL    Comment: Glucose reference range applies only to samples taken after fasting for at least 8 hours.   BUN 31 (H) 8 - 23 mg/dL   Creatinine, Ser 8.65 (H) 0.44 - 1.00 mg/dL   Calcium 9.1 8.9 - 78.4 mg/dL   Phosphorus 4.9 (H) 2.5 - 4.6 mg/dL   Albumin 3.0 (L) 3.5 - 5.0 g/dL   GFR, Estimated 7 (L) >60 mL/min    Comment: (NOTE) Calculated using the CKD-EPI Creatinine Equation (2021)    Anion gap 15 5 - 15    Comment: Performed at [facility] Lab, [address] [place], [place] [address]  Glucose, capillary     Status: Abnormal   Collection Time: 08/14/22  9:18 AM  Result Value Ref Range   Glucose-Capillary 150 (H) 70 - 99 mg/dL    Comment: Glucose reference range applies only to samples taken after fasting for at least 8 hours.  Lactic acid, plasma     Status: None   Collection Time: 08/14/22 11:22 AM  Result Value Ref Range   Lactic Acid, Venous 1.5 0.5 - 1.9 mmol/L    Comment: Performed at [facility] Lab, [address], [place], [place] [address]  Glucose, capillary     Status: Abnormal   Collection Time: 08/14/22 12:31 PM  Result Value Ref Range   Glucose-Capillary 129 (H) 70 - 99 mg/dL    Comment: Glucose reference range applies only to samples taken after fasting for at least 8 hours.  Glucose, capillary     Status: Abnormal   Collection Time: 08/14/22  4:10 PM  Result Value Ref Range   Glucose-Capillary 168 (H) 70 - 99 mg/dL    Comment: Glucose reference range applies only to samples taken after  fasting for at least 8 hours.   *Note: Due to a large number of results and/or encounters for the requested time period, some results have not been displayed. A complete set of results can be found in Results Review.    DG Foot Complete Left  Result Date: 08/13/2022 Please see detailed radiograph report in office note.  DG Chest 2 View  Result Date: 08/13/2022  CLINICAL DATA:  Toe infection. EXAM: CHEST - 2 VIEW COMPARISON:  Chest radiograph 04/24/2022 FINDINGS: The cardiac silhouette remains mildly enlarged. Lung volumes are low. No acute airspace opacity, edema, sizable pleural effusion, or pneumothorax is identified. Scarring is again noted in the left mid to lower lung. No acute osseous abnormality is seen. IMPRESSION: No active cardiopulmonary disease. Electronically Signed   By: Sebastian Ache M.D.   On: 08/13/2022 15:28    ROS Blood pressure (!) 146/62, pulse 79, temperature 98.5 F (36.9 C), temperature source Oral, resp. rate 16, SpO2 95 %.  Vitals:   08/14/22 1000 08/14/22 1100  BP:  (!) 146/62  Pulse: 82 79  Resp: 18 16  Temp:  98.5 F (36.9 C)  SpO2: 97% 95%    General AA&O x3. Normal mood and affect.  Vascular Dorsalis pedis and posterior tibial pulses  {:23035} {Left/right:33004}  Capillary refill {pe heart capillary fill:310346} to all digits. Pedal hair growth {Desc; normal/diminished:12763}.  Neurologic Epicritic sensation grossly {DESC; ABSENT OR PRESENT:19119}.  Dermatologic (Wound) Wound Location: {:11009} Wound Measurement: *** Wound Base: {findings; base ulcer:11056} Peri-wound: {Peri-wound:10864} Exudate: {:10862}  Orthopedic: Motor intact BLE.    Assessment/Plan:  *** -Imaging: Studies independently reviewed -Antibiotics: *** -WB Status: *** -Wound Care: *** -Surgical Plan: ***  [name] 08/14/2022, 4:20 PM   Best available via secure chat for questions or concerns.

## 2022-08-14 NOTE — Consult Note (Addendum)
Reason for Consult: Continuity of ESRD care Referring Physician: Hughie Closs MD [facility])  HPI: 67 year old woman with past medical history significant for hypertension, type 2 diabetes mellitus, dyslipidemia, obstructive sleep apnea, peripheral vascular disease status post right below-knee amputation on 05/29/2022 and end-stage renal disease on hemodialysis on a Monday/Wednesday/Friday schedule.  Presented to the emergency room yesterday after directed to come in by Dr. Ardelle Anton for intravenous antibiotics and likely left transmetatarsal amputation after failing efforts at revascularization of the left foot and outpatient management with oral antibiotics for management of nonhealing left toe wounds.  She denies any chest pain or shortness of breath at this time and had hemodialysis uneventfully on 5/22.  She does not recall her estimated dry weight and whether she was able to reach it.  She denies any nausea, vomiting or diarrhea.  She denies any cough or sputum production.  She reports decent pain control of the left foot and right BKA stump.  Dialysis prescription: [facility] MWF, 3 hours 45 minutes, 84 kg, BFR 400/DFR 600, left upper arm AV fistula, heparin 1000 unit bolus and then 1000 units/h  Past Medical History:  Diagnosis Date   Acid reflux    Anemia    Arthritis    Axillary masses    Soft tissue - status post excision   Back pain    COVID-19 virus infection 04/06/2019   Depression    End-stage renal disease (HCC)    M/W/F dialysis   Essential hypertension    Headache    years ago   History of blood transfusion    History of cardiac catheterization    Normal coronary arteries October 2020   History of claustrophobia    History of pneumonia 2019   Hypoxia 04/03/2019   Memory loss    Mixed hyperlipidemia    Obesity    Pancreatitis    Peritoneal dialysis catheter in place [facility])    Pneumonia due to COVID-19 virus 04/02/2019   Sleep apnea    Noncompliant with CPAP    Stroke (HCC)    mini stroke   Type 2 diabetes mellitus (HCC)     Past Surgical History:  Procedure Laterality Date   ABDOMINAL AORTOGRAM W/LOWER EXTREMITY N/A 04/30/2022   Procedure: ABDOMINAL AORTOGRAM W/LOWER EXTREMITY;  Surgeon: Leonie Douglas, MD;  Location: [facility] INVASIVE CV LAB;  Service: Cardiovascular;  Laterality: N/A;   ABDOMINAL AORTOGRAM W/LOWER EXTREMITY N/A 07/21/2022   Procedure: ABDOMINAL AORTOGRAM W/LOWER EXTREMITY;  Surgeon: Nada Libman, MD;  Location: [facility] INVASIVE CV LAB;  Service: Cardiovascular;  Laterality: N/A;   ABDOMINAL HYSTERECTOMY     AMPUTATION Right 05/29/2022   Procedure: RIGHT BELOW THE KNEE AMPUTATION;  Surgeon: [name], MD;  Location: [facility] OR;  Service: Orthopedics;  Laterality: Right;   AV FISTULA PLACEMENT Left 09/02/2017   Procedure: creation of left arm ARTERIOVENOUS (AV) FISTULA;  Surgeon: Nada Libman, MD;  Location: [facility] OR;  Service: Vascular;  Laterality: Left;   COLONOSCOPY  2008   Dr. Darrick Penna: normal    COLONOSCOPY N/A 12/18/2016   Dr. Darrick Penna: multiple tubular adenomas, internal hemorrhoids. Surveillance in 3 years    ESOPHAGEAL DILATION N/A 10/13/2015   Procedure: ESOPHAGEAL DILATION;  Surgeon: Malissa Hippo, MD;  Location: [facility];  Service: Endoscopy;  Laterality: N/A;   ESOPHAGOGASTRODUODENOSCOPY N/A 10/13/2015   Dr. Karilyn Cota: chronic gastritis on path, no H.pylori. Empiric dilation    ESOPHAGOGASTRODUODENOSCOPY N/A 12/18/2016   Dr. Darrick Penna: mild gastritis. BRAVO study revealed uncontrolled GERD. Dysphagia  secondary to uncontrolled reflux   FOOT SURGERY Bilateral    "nerve"     LEFT HEART CATH AND CORONARY ANGIOGRAPHY N/A 12/29/2018   Procedure: LEFT HEART CATH AND CORONARY ANGIOGRAPHY;  Surgeon: Corky Crafts, MD;  Location: [facility] INVASIVE CV LAB;  Service: Cardiovascular;  Laterality: N/A;   LOWER EXTREMITY ANGIOGRAPHY Right 05/04/2022   Procedure: Lower Extremity Angiography;  Surgeon: Victorino Sparrow, MD;  Location: [facility]  INVASIVE CV LAB;  Service: Cardiovascular;  Laterality: Right;   LUNG BIOPSY     MASS EXCISION Right 01/09/2013   Procedure: EXCISION OF NEOPLASM OF RIGHT  AXILLA  AND EXCISION OF NEOPLASM OF LEFT AXILLA;  Surgeon: Dalia Heading, MD;  Location: [facility] ORS;  Service: General;  Laterality: Right;  procedure end @ 08:23   MYRINGOTOMY WITH TUBE PLACEMENT Bilateral 04/28/2017   Procedure: BILATERAL MYRINGOTOMY WITH TUBE PLACEMENT;  Surgeon: Newman Pies, MD;  Location: [facility] OR;  Service: ENT;  Laterality: Bilateral;   PERIPHERAL VASCULAR BALLOON ANGIOPLASTY Right 05/04/2022   Procedure: PERIPHERAL VASCULAR BALLOON ANGIOPLASTY;  Surgeon: Victorino Sparrow, MD;  Location: [facility] INVASIVE CV LAB;  Service: Cardiovascular;  Laterality: Right;  PT   PERIPHERAL VASCULAR INTERVENTION Right 05/04/2022   Procedure: PERIPHERAL VASCULAR INTERVENTION;  Surgeon: Victorino Sparrow, MD;  Location: [facility] INVASIVE CV LAB;  Service: Cardiovascular;  Laterality: Right;  SFA   PERIPHERAL VASCULAR INTERVENTION Left 07/21/2022   Procedure: PERIPHERAL VASCULAR INTERVENTION;  Surgeon: Nada Libman, MD;  Location: [facility] INVAS[facility] CV LAB;  Service: Cardiovascular;  Laterality: Left;   REVISION OF ARTERIOVENOUS GORETEX GRAFT Left 05/04/2018   Procedure: TRANSPOSITION OF CEPHALIC VEIN ARTERIOVENOUS FISTULA LEFT ARM;  Surgeon: Larina Earthly, MD;  Location: [facility] OR;  Service: Vascular;  Laterality: Left;   SAVORY DILATION N/A 12/18/2016   Procedure: SAVORY DILATION;  Surgeon: West Bali, MD;  Location: [facility];  Service: Endoscopy;  Laterality: N/A;    Family History  Problem Relation Age of Onset   Hypertension Father    Hypercholesterolemia Father    Arthritis Father    Hypertension Sister    Hypercholesterolemia Sister    Breast cancer Sister    Hypertension Sister    Colon cancer Neg Hx    Colon polyps Neg Hx     Social History:  reports that she has never smoked. She has never been exposed to tobacco smoke. She has never used  smokeless tobacco. She reports that she does not drink alcohol and does not use drugs.  Allergies:  Allergies  Allergen Reactions   Ace Inhibitors Anaphylaxis and Swelling   Penicillins Itching, Swelling and Other (See Comments)    Did it involve swelling of the face/tongue/throat, SOB, or low BP? Unknown Did it involve sudden or severe rash/hives, skin peeling, or any reaction on the inside of your mouth or nose? Unknown Did you need to seek medical attention at a hospital or doctor's office? Unknown When did it last happen?      years  If all above answers are "NO", may proceed with cephalosporin use.    Statins Other (See Comments)    elevated LFT's     Albuterol Swelling    Medications: I have reviewed the patient's current medications. Scheduled:  aspirin  81 mg Oral Daily   calcitRIOL  0.25 mcg Oral BID WC   DULoxetine  60 mg Oral Daily   ezetimibe  10 mg Oral Daily   furosemide  80 mg Oral BID   heparin  5,000 Units Subcutaneous Q8H   insulin aspart  0-9 Units Subcutaneous TID WC   insulin aspart  3 Units Subcutaneous TID WC   insulin glargine-yfgn  30 Units Subcutaneous Daily   ketotifen  1 drop Both Eyes BID   metoprolol succinate  50 mg Oral Daily   metroNIDAZOLE  500 mg Oral Q12H   pregabalin  50 mg Oral TID   sertraline  50 mg Oral Daily   sucroferric oxyhydroxide  1,000 mg Oral TID WC   sucroferric oxyhydroxide  500 mg Oral With snacks   Continuous:  cefTRIAXone (ROCEPHIN)  IV Stopped (08/13/22 2052)   vancomycin         Latest Ref Rng & Units 08/13/2022    2:52 PM 07/20/2022    3:02 AM 07/19/2022    2:36 AM  BMP  Glucose 70 - 99 mg/dL 161  096  045   BUN 8 - 23 mg/dL 22  39  27   Creatinine 0.44 - 1.00 mg/dL 4.09  8.11  9.14   Sodium 135 - 145 mmol/L 140  142  141   Potassium 3.5 - 5.1 mmol/L 4.0  3.5  3.7   Chloride 98 - 111 mmol/L 101  105  104   CO2 22 - 32 mmol/L 26  26  26    Calcium 8.9 - 10.3 mg/dL 9.1  9.1  9.2       Latest Ref Rng & Units  08/13/2022    2:52 PM 07/20/2022    3:02 AM 07/19/2022    2:35 AM  CBC  WBC 4.0 - 10.5 K/uL 9.3  7.4  7.1   Hemoglobin 12.0 - 15.0 g/dL 9.2  8.7  8.9   Hematocrit 36.0 - 46.0 % 30.9  28.9  28.5   Platelets 150 - 400 K/uL 315  318  295     DG Foot Complete Left  Result Date: 08/13/2022 Please see detailed radiograph report in office note.  DG Chest 2 View  Result Date: 08/13/2022 CLINICAL DATA:  Toe infection. EXAM: CHEST - 2 VIEW COMPARISON:  Chest radiograph 04/24/2022 FINDINGS: The cardiac silhouette remains mildly enlarged. Lung volumes are low. No acute airspace opacity, edema, sizable pleural effusion, or pneumothorax is identified. Scarring is again noted in the left mid to lower lung. No acute osseous abnormality is seen. IMPRESSION: No active cardiopulmonary disease. Electronically Signed   By: Sebastian Ache M.D.   On: 08/13/2022 15:28    Review of Systems  Constitutional:  Positive for fatigue. Negative for chills and fever.  HENT:  Negative for nosebleeds, sore throat and trouble swallowing.   Eyes:  Negative for photophobia and visual disturbance.  Respiratory:  Negative for cough, chest tightness and shortness of breath.   Cardiovascular:  Negative for chest pain and palpitations.  Gastrointestinal:  Negative for abdominal pain, diarrhea, nausea and vomiting.  Endocrine: Negative for polydipsia and polyuria.  Genitourinary:  Negative for dysuria and hematuria.  Musculoskeletal:  Positive for gait problem.       Status post right BKA with nonhealing left foot ulcers  Skin:  Positive for wound.  Neurological:  Negative for dizziness, weakness and light-headedness.   Blood pressure (!) 154/65, pulse 81, temperature 98 F (36.7 C), temperature source Oral, resp. rate 17, SpO2 97 %. Physical Exam Vitals and nursing note reviewed.  Constitutional:      General: She is not in acute distress.    Appearance: Normal appearance. She is obese. She is not toxic-appearing.  HENT:  Head: Normocephalic and atraumatic.     Right Ear: External ear normal.     Left Ear: External ear normal.     Nose: Nose normal.     Mouth/Throat:     Mouth: Mucous membranes are dry.  Eyes:     General: No scleral icterus.    Extraocular Movements: Extraocular movements intact.     Conjunctiva/sclera: Conjunctivae normal.  Cardiovascular:     Rate and Rhythm: Regular rhythm. Bradycardia present.     Pulses: Normal pulses.     Heart sounds: Normal heart sounds.  Pulmonary:     Effort: Pulmonary effort is normal.     Breath sounds: Normal breath sounds. No wheezing or rales.  Abdominal:     General: Abdomen is flat. Bowel sounds are normal.     Tenderness: There is no abdominal tenderness. There is no guarding.  Musculoskeletal:     Cervical back: Normal range of motion and neck supple.     Comments: Left brachiocephalic fistula with palpable thrill.  Status post right BKA.  Left foot with a clean/intact dressing  Neurological:     Mental Status: She is alert.     Assessment/Plan: 1.  Nonhealing left lower extremity foot/toe ulcers: Status post efforts at revascularization and outpatient antibiotic as an outpatient with poor response.  Plans noted for likely left foot TMA tomorrow with ongoing intravenous antibiotics for osteomyelitis. 2.  End-stage renal disease: Usually on a MWF dialysis schedule, will order for HD today. 3.  Hypertension: Blood pressures marginally elevated likely associated with pain, continue to monitor on oral antihypertensive therapy and ultrafiltration with hemodialysis. 4.  Anemia of chronic disease: Hemoglobin and hematocrit are marginal with anemia likely exacerbated by ESA resistance in the setting of nonhealing wounds.  Will order for ESA. 5.  Secondary hyperparathyroidism: Calcium level currently at goal, will check phosphorus level with labs today and continue [name] for phosphorus binding as well as calcitriol for PTH control.  Linsi Humann  K. 08/14/2022, 8:40 AM

## 2022-08-14 NOTE — Progress Notes (Signed)
Initial Nutrition Assessment  DOCUMENTATION CODES:   Not applicable  INTERVENTION:  -1 packet Juven BID, each packet provides 95 calories, 2.5 grams of protein (collagen), and 9.8 grams of carbohydrate (3 grams sugar); also contains 7 grams of L-arginine and L-glutamine, 300 mg vitamin C, 15 mg vitamin E, 1.2 mcg vitamin B-12, 9.5 mg zinc, 200 mg calcium, and 1.5 g  Calcium Beta-hydroxy-Beta-methylbutyrate to support wound healing  Nepro Shake po daily, each supplement provides 425 kcal and 19 grams protein  Communicate with MD about nutrition interventions   Liberalize diet to renal   Order micronutrient labs:  Vit A, C, E, B1, and zinc   Encourage po intake   NUTRITION DIAGNOSIS:   Inadequate oral intake related to nausea as evidenced by per patient/family report.  GOAL:   Patient will meet greater than or equal to 90% of their needs  MONITOR:   PO intake, I & O's, Supplement acceptance, Labs, Weight trends, Skin  REASON FOR ASSESSMENT:   Consult Wound healing  ASSESSMENT:   67 y.o. female with PMHx including  ESRD on HD, HTN, T2DM, CVA, recent R BKA presents with acute osteomyelitis of toe on left foot and admitted for IV abx and likely transmetatarsal amputation on Saturday  Despite vascular intervention and abx + wound care- patient's foot wound has continued to worsen of the past week   Labs: Glu 265, Cr 5.2, hgb 9.2, hga1c 6.3 (as of 07/07/22)    Meds: rocephin, lasix, insulin, flagyl, vancocin, zetia, rocaltrol, velphoro Dry wt: 81.6 kg as of 08/04/2022    Wt Readings from Last 10 Encounters:  08/04/22 81.6 kg  07/22/22 81.6 kg  06/10/22 79.7 kg  05/22/22 86.6 kg  05/14/22 82.1 kg  05/07/22 77 kg  04/21/22 86.2 kg  04/16/22 89.5 kg  04/16/22 89.4 kg  04/14/22 84.4 kg  PO: no meals documented at this time  I/O's:  +690 ml   Visited patient at bedside who reports decreased appetite due to persistent nausea. She knows she has lost weight but cannot say  how much. Patient has not been weighed since admission.  She reports not being able to eat much due to strict diet here. RD reaching out to MD for diet liberalization.  Patient reports weakness. Plans for surgery this weekend and RD explained need for adequate nutrition before and surgery for optimal healing.   Patient denies trouble chewing and swallowing. She reports constipation sometimes, RD advised her to let us know if she goes a few days without a BM so that she can be started on a bowel regimen.   She is agreeable to ONS.   NUTRITION - FOCUSED PHYSICAL EXAM:  Flowsheet Row Most Recent Value  Orbital Region No depletion  Upper Arm Region No depletion  Thoracic and Lumbar Region No depletion  Buccal Region No depletion  Temple Region No depletion  Clavicle Bone Region No depletion  Clavicle and Acromion Bone Region No depletion  Scapular Bone Region No depletion  Dorsal Hand Moderate depletion  Patellar Region No depletion  Anterior Thigh Region No depletion  Posterior Calf Region No depletion  Edema (RD Assessment) None  Hair Reviewed  Eyes Reviewed  Mouth Reviewed  Skin Other (Comment)  [flaky/dermatitis?]  Nails Unable to assess  [paint on nails]       Diet Order:   Diet Order             Diet NPO time specified  Diet effective midnight  Diet renal with fluid restriction Fluid restriction: 1200 mL Fluid; Room service appropriate? Yes; Fluid consistency: Thin  Diet effective now                   EDUCATION NEEDS:   Education needs have been addressed  Skin:  Skin Assessment: Skin Integrity Issues: Skin Integrity Issues:: Diabetic Ulcer Diabetic Ulcer: L foot  Last BM:  unknown  Height:   Ht Readings from Last 1 Encounters:  07/18/22 5\' 4"  (1.626 m)    Weight:   Wt Readings from Last 1 Encounters:  08/04/22 81.6 kg    Ideal Body Weight:     BMI:  There is no height or weight on file to calculate BMI.  Estimated Nutritional  Needs:   Kcal:  1600-1800  Protein:  95-120 g  Fluid:  >1.6 L    Leodis Rains, RDN, LDN  Clinical Nutrition

## 2022-08-14 NOTE — Progress Notes (Signed)
PROGRESS NOTE    Kathryn Beck  ZOX:[id number] DOB: 1955/07/22 DOA: 08/13/2022 PCP: [name], MD   Brief Narrative:  Kathryn Beck is a 67 y.o. female with medical history significant of ESRD on HD, MWF,, HTN, DM2, CVA, recent RLE BKA performed by Dr. Lajoyce Corners 05/29/22, critical left lower extremity ischemia with great toe and second toe ulceration with PVD s/p s/p angiogram with lithotripsy, left superficial femoral artery and popliteal artery Dr Myra Gianotti on aspirin Plavix and Zetia as unable to take statin due to allergy, was seen by podiatry in the office due to nonhealing left foot ulcer and was sent to the hospital for potential surgical intervention which is scheduled on Saturday.  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  Will discontinue Flagyl but continue rest of the antibiotics.  ESRD on HD: On MWF schedule.  I have consulted nephrology.  She is due for dialysis today.  Chronic pain syndrome: Continue home Lyrica and Percocet.  Essential hypertension: Controlled.  Continue metoprolol.  Type 2 diabetes mellitus: On 30 units of Semglee and 3 units of NovoLog 3 times daily Premeal along with SSI, blood sugar labile.  Continue current management for now.  DVT prophylaxis: heparin injection 5,000 Units Start: 08/13/22 2200   Code Status: Full Code  Family Communication: Daughter present at bedside.  Plan of care discussed with patient in length and he/she verbalized understanding and agreed with it.  Status is: Inpatient Remains  inpatient appropriate because: Scheduled for surgery tomorrow morning.   Estimated body mass index is 30.88 kg/m as calculated from the following:   Height as of 07/18/22: 5\' 4"  (1.626 m).   Weight as of 08/04/22: 81.6 kg.    Nutritional Assessment: There is no height or weight on file to calculate BMI.. Seen by dietician.  I agree with the assessment and plan as outlined below: Nutrition Status:        . Skin Assessment: I have examined the patient's skin and I agree with the wound assessment as performed by the wound care RN as outlined below:    Consultants:  Podiatry  Procedures:  As above  Antimicrobials:  Anti-infectives (From admission, onward)    Start     Dose/Rate Route Frequency Ordered Stop   08/14/22 1200  vancomycin (VANCOCIN) IVPB 1000 mg/200 mL premix        1,000 mg 200 mL/hr over 60 Minutes Intravenous Every M-W-F (Hemodialysis) 08/13/22 1950     08/13/22 2200  metroNIDAZOLE (FLAGYL) tablet 500 mg        500 mg Oral Every 12 hours 08/13/22 1948 08/20/22 2159   08/13/22 2000  vancomycin (VANCOREADY) IVPB 1750 mg/350 mL        1,750 mg 175 mL/hr over 120 Minutes Intravenous  Once 08/13/22 1949 08/13/22 2256   08/13/22 1952  cefTRIAXone (ROCEPHIN) 2 g in sodium chloride 0.9 % 100 mL IVPB        2 g 200 mL/hr over 30 Minutes Intravenous Every 24 hours 08/13/22 1948 08/20/22 1959   08/13/22 1915  ceFEPIme (  MAXIPIME) 2 g in sodium chloride 0.9 % 100 mL IVPB  Status:  Discontinued        2 g 200 mL/hr over 30 Minutes Intravenous  Once 08/13/22 1909 08/13/22 1952         Subjective: Seen and examined.  Mild pain in the left foot but no new complaint.  Daughter at the bedside.  Objective: Vitals:   08/14/22 0024 08/14/22 0142 08/14/22 0344 08/14/22 0719  BP: (!) 155/65 (!) 170/67 (!) 154/65   Pulse: 74 78 76 81  Resp:  20 17   Temp:  98.1 F (36.7 C) 98 F (36.7 C)   TempSrc:  Oral Oral   SpO2: 98% 98% 98% 97%    Intake/Output Summary (Last 24  hours) at 08/14/2022 0959 Last data filed at 08/14/2022 0719 Gross per 24 hour  Intake 690.08 ml  Output 0 ml  Net 690.08 ml   There were no vitals filed for this visit.  Examination:  General exam: Appears calm and comfortable  Respiratory system: Clear to auscultation. Respiratory effort normal. Cardiovascular system: S1 & S2 heard, RRR. No JVD, murmurs, rubs, gallops or clicks.  Gastrointestinal system: Abdomen is nondistended, soft and nontender. No organomegaly or masses felt. Normal bowel sounds heard. Central nervous system: Alert and oriented. No focal neurological deficits. Skin: Dressing in the left foot.  Right BKA. Psychiatry: Judgement and insight appear normal. Mood & affect appropriate.    Data Reviewed: I have personally reviewed following labs and imaging studies  CBC: Recent Labs  Lab 08/13/22 1452  WBC 9.3  NEUTROABS 6.3  HGB 9.2*  HCT 30.9*  MCV 81.3  PLT 315   Basic Metabolic Panel: Recent Labs  Lab 08/13/22 1452  NA 140  K 4.0  CL 101  CO2 26  GLUCOSE 265*  BUN 22  CREATININE 5.20*  CALCIUM 9.1   GFR: Estimated Creatinine Clearance: 11 mL/min (A) (by C-G formula based on SCr of 5.2 mg/dL (H)). Liver Function Tests: Recent Labs  Lab 08/13/22 1452  AST 27  ALT 28  ALKPHOS 112  BILITOT 0.4  PROT 7.6  ALBUMIN 3.1*   No results for input(s): "LIPASE", "AMYLASE" in the last 168 hours. No results for input(s): "AMMONIA" in the last 168 hours. Coagulation Profile: No results for input(s): "INR", "PROTIME" in the last 168 hours. Cardiac Enzymes: No results for input(s): "CKTOTAL", "CKMB", "CKMBINDEX", "TROPONINI" in the last 168 hours. BNP (last 3 results) No results for input(s): "PROBNP" in the last 8760 hours. HbA1C: No results for input(s): "HGBA1C" in the last 72 hours. CBG: Recent Labs  Lab 08/14/22 0918  GLUCAP 150*   Lipid Profile: No results for input(s): "CHOL", "HDL", "LDLCALC", "TRIG", "CHOLHDL", "LDLDIRECT" in the  last 72 hours. Thyroid Function Tests: No results for input(s): "TSH", "T4TOTAL", "FREET4", "T3FREE", "THYROIDAB" in the last 72 hours. Anemia Panel: No results for input(s): "VITAMINB12", "FOLATE", "FERRITIN", "TIBC", "IRON", "RETICCTPCT" in the last 72 hours. Sepsis Labs: Recent Labs  Lab 08/13/22 1452  LATICACIDVEN 2.3*    Recent Results (from the past 240 hour(s))  Blood culture (routine x 2)     Status: None (Preliminary result)   Collection Time: 08/13/22  7:53 PM   Specimen: BLOOD RIGHT HAND  Result Value Ref Range Status   Specimen Description BLOOD RIGHT HAND  Final   Special Requests   Final    BOTTLES DRAWN AEROBIC AND ANAEROBIC Blood Culture adequate volume   Culture   Final    NO GROWTH <  12 HOURS Performed at [facility], [address], [place], [place] [address]    Report Status PENDING  Incomplete  MRSA Next Gen by PCR, Nasal     Status: None   Collection Time: 08/13/22  8:02 PM   Specimen: Nasal Mucosa; Nasal Swab  Result Value Ref Range Status   MRSA by PCR Next Gen NOT DETECTED NOT DETECTED Final    Comment: (NOTE) The GeneXpert MRSA Assay (FDA approved for NASAL specimens only), is one component of a comprehensive MRSA colonization surveillance program. It is not intended to diagnose MRSA infection nor to guide or monitor treatment for MRSA infections. Test performance is not FDA approved in patients less than 66 years old. Performed at [facility], [address] [place], [place] [address]      Radiology Studies: DG Foot Complete Left  Result Date: 08/13/2022 Please see detailed radiograph report in office note.  DG Chest 2 View  Result Date: 08/13/2022 CLINICAL DATA:  Toe infection. EXAM: CHEST - 2 VIEW COMPARISON:  Chest radiograph 04/24/2022 FINDINGS: The cardiac silhouette remains mildly enlarged. Lung volumes are low. No acute airspace opacity, edema, sizable pleural effusion, or pneumothorax is identified. Scarring is again  noted in the left mid to lower lung. No acute osseous abnormality is seen. IMPRESSION: No active cardiopulmonary disease. Electronically Signed   By: Sebastian Ache M.D.   On: 08/13/2022 15:28    Scheduled Meds:  aspirin  81 mg Oral Daily   calcitRIOL  0.25 mcg Oral BID WC   DULoxetine  60 mg Oral Daily   ezetimibe  10 mg Oral Daily   furosemide  80 mg Oral BID   heparin  5,000 Units Subcutaneous Q8H   insulin aspart  0-9 Units Subcutaneous TID WC   insulin aspart  3 Units Subcutaneous TID WC   insulin glargine-yfgn  30 Units Subcutaneous Daily   ketotifen  1 drop Both Eyes BID   metoprolol succinate  50 mg Oral Daily   metroNIDAZOLE  500 mg Oral Q12H   pregabalin  50 mg Oral TID   sertraline  50 mg Oral Daily   sucroferric oxyhydroxide  1,000 mg Oral TID WC   sucroferric oxyhydroxide  500 mg Oral With snacks   Continuous Infusions:  cefTRIAXone (ROCEPHIN)  IV Stopped (08/13/22 2052)   vancomycin       LOS: 1 day   Hughie Closs, MD [facility]  08/14/2022, 9:59 AM   *Please note that this is a verbal dictation therefore any spelling or grammatical errors are due to the "[employer] One" system interpretation.  Please page via Amion and do not message via secure chat for urgent patient care matters. Secure chat can be used for non urgent patient care matters.  How to contact the [facility] Attending or Consulting provider 7A - 7P or covering provider during after hours 7P -7A, for this patient?  Check the care team in [facility] and look for a) attending/consulting [facility] provider listed and b) the [facility] team listed. Page or secure chat 7A-7P. Log into [email or web address] and use [place]'s universal password to access. If you do not have the password, please contact the hospital operator. Locate the [facility] provider you are looking for under [facility] and page to a number that you can be directly reached. If you still have difficulty reaching the provider, please page the [facility] (Director  on Call) for the Hospitalists listed on amion for assistance.

## 2022-08-14 NOTE — ED Notes (Addendum)
ED TO INPATIENT HANDOFF REPORT  ED Nurse Name and Phone #: Amil Amen [phone]  S Name/Age/Gender Kathryn Beck 67 y.o. female Room/Bed: 037C/037C  Code Status   Code Status: Full Code  Home/SNF/Other Home Patient oriented to: self, place, time, and situation Is this baseline? Yes   Triage Complete: Triage complete  Chief Complaint Acute osteomyelitis of toe of left foot [facility]) [M86.172]  Triage Note Pt reports that she had an appt with MD Ardelle Anton at 1300 today for an infection to her toes on her left foot and was sent here to be admitted for surgery.    Allergies Allergies  Allergen Reactions   Ace Inhibitors Anaphylaxis and Swelling   Penicillins Itching, Swelling and Other (See Comments)    Did it involve swelling of the face/tongue/throat, SOB, or low BP? Unknown Did it involve sudden or severe rash/hives, skin peeling, or any reaction on the inside of your mouth or nose? Unknown Did you need to seek medical attention at a hospital or doctor's office? Unknown When did it last happen?      years  If all above answers are "NO", may proceed with cephalosporin use.    Statins Other (See Comments)    elevated LFT's     Albuterol Swelling    Level of Care/Admitting Diagnosis ED Disposition     ED Disposition  Admit   Condition  --   Comment  Hospital Area: [facility] [[id number]]  Level of Care: Med-Surg [[id number]]  May admit patient to [facility] or [facility] if equivalent level of care is available:: No  Covid Evaluation: Asymptomatic - no recent exposure (last 10 days) testing not required  Diagnosis: Acute osteomyelitis of toe of left foot [facility]) [[id number]]  Admitting Physician: Hillary Bow [[id number]]  Attending Physician: Hillary Bow [phone]  Certification:: I certify this patient is being admitted for an inpatient-only procedure  Estimated Length of Stay: 4          B Medical/Surgery History Past Medical History:  Diagnosis Date    Acid reflux    Anemia    Arthritis    Axillary masses    Soft tissue - status post excision   Back pain    COVID-19 virus infection 04/06/2019   Depression    End-stage renal disease (HCC)    M/W/F dialysis   Essential hypertension    Headache    years ago   History of blood transfusion    History of cardiac catheterization    Normal coronary arteries October 2020   History of claustrophobia    History of pneumonia 2019   Hypoxia 04/03/2019   Memory loss    Mixed hyperlipidemia    Obesity    Pancreatitis    Peritoneal dialysis catheter in place [facility])    Pneumonia due to COVID-19 virus 04/02/2019   Sleep apnea    Noncompliant with CPAP   Stroke (HCC)    mini stroke   Type 2 diabetes mellitus (HCC)    Past Surgical History:  Procedure Laterality Date   ABDOMINAL AORTOGRAM W/LOWER EXTREMITY N/A 04/30/2022   Procedure: ABDOMINAL AORTOGRAM W/LOWER EXTREMITY;  Surgeon: Leonie Douglas, MD;  [facility] INVASIVE CV LAB;  Service: Cardiovascular;  Laterality: N/A;   ABDOMINAL AORTOGRAM W/LOWER EXTREMITY N/A 07/21/2022   Procedure: ABDOMINAL AORTOGRAM W/LOWER EXTREMITY;  Surgeon: Nada Libman, MD;  Location: [facility] INVASIVE CV LAB;  Service: Cardiovascular;  Laterality: N/A;   ABDOMINAL HYSTERECTOMY     AMPUTATION Right  05/29/2022   Procedure: RIGHT BELOW THE KNEE AMPUTATION;  Surgeon: [name], MD;  Location: [facility] OR;  Service: Orthopedics;  Laterality: Right;   AV FISTULA PLACEMENT Left 09/02/2017   Procedure: creation of left arm ARTERIOVENOUS (AV) FISTULA;  Surgeon: Nada Libman, MD;  Location: [facility] OR;  Service: Vascular;  Laterality: Left;   COLONOSCOPY  2008   Dr. Darrick Penna: normal    COLONOSCOPY N/A 12/18/2016   Dr. Darrick Penna: multiple tubular adenomas, internal hemorrhoids. Surveillance in 3 years    ESOPHAGEAL DILATION N/A 10/13/2015   Procedure: ESOPHAGEAL DILATION;  Surgeon: Malissa Hippo, MD;  Location: [facility] ENDO [facility];  Service: Endoscopy;  Laterality: N/A;    ESOPHAGOGASTRODUODENOSCOPY N/A 10/13/2015   Dr. Karilyn Cota: chronic gastritis on path, no H.pylori. Empiric dilation    ESOPHAGOGASTRODUODENOSCOPY N/A 12/18/2016   Dr. Darrick Penna: mild gastritis. BRAVO study revealed uncontrolled GERD. Dysphagia secondary to uncontrolled reflux   FOOT SURGERY Bilateral    "nerve"     LEFT HEART CATH AND CORONARY ANGIOGRAPHY N/A 12/29/2018   Procedure: LEFT HEART CATH AND CORONARY ANGIOGRAPHY;  Surgeon: Corky Crafts, MD;  Location: [facility] INVASIVE CV LAB;  Service: Cardiovascular;  Laterality: N/A;   LOWER EXTREMITY ANGIOGRAPHY Right 05/04/2022   Procedure: Lower Extremity Angiography;  Surgeon: Victorino Sparrow, MD;  Location: [facility] INVASIVE CV LAB;  Service: Cardiovascular;  Laterality: Right;   LUNG BIOPSY     MASS EXCISION Right 01/09/2013   Procedure: EXCISION OF NEOPLASM OF RIGHT  AXILLA  AND EXCISION OF NEOPLASM OF LEFT AXILLA;  Surgeon: Dalia Heading, MD;  Location: [facility] ORS;  Service: General;  Laterality: Right;  procedure end @ 08:23   MYRINGOTOMY WITH TUBE PLACEMENT Bilateral 04/28/2017   Procedure: BILATERAL MYRINGOTOMY WITH TUBE PLACEMENT;  Surgeon: Newman Pies, MD;  Location: [facility] OR;  Service: ENT;  Laterality: Bilateral;   PERIPHERAL VASCULAR BALLOON ANGIOPLASTY Right 05/04/2022   Procedure: PERIPHERAL VASCULAR BALLOON ANGIOPLASTY;  Surgeon: Victorino Sparrow, MD;  Location: [facility] INVASIVE CV LAB;  Service: Cardiovascular;  Laterality: Right;  PT   PERIPHERAL VASCULAR INTERVENTION Right 05/04/2022   Procedure: PERIPHERAL VASCULAR INTERVENTION;  Surgeon: Victorino Sparrow, MD;  Location: [facility] INVASIVE CV LAB;  Service: Cardiovascular;  Laterality: Right;  SFA   PERIPHERAL VASCULAR INTERVENTION Left 07/21/2022   Procedure: PERIPHERAL VASCULAR INTERVENTION;  Surgeon: Nada Libman, MD;  [facility];  Service: Cardiovascular;  Laterality: Left;   REVISION OF ARTERIOVENOUS GORETEX GRAFT Left 05/04/2018   Procedure: TRANSPOSITION OF CEPHALIC VEIN ARTERIOVENOUS  FISTULA LEFT ARM;  Surgeon: Larina Earthly, MD;  Location: [facility] OR;  Service: Vascular;  Laterality: Left;   SAVORY DILATION N/A 12/18/2016   Procedure: SAVORY DILATION;  Surgeon: West Bali, MD;  [facility];  Service: Endoscopy;  Laterality: N/A;     A IV Location/Drains/Wounds Patient Lines/Drains/Airways Status     Active Line/Drains/Airways     Name Placement date Placement time Site Days   Peripheral IV 08/13/22 20 G Posterior;Right Hand 08/13/22  2011  Hand  1   Fistula / Graft Left Upper arm Arteriovenous fistula 09/02/17  1150  Upper arm  1807   Wound / Incision (Open or Dehisced) 04/24/22 Non-pressure wound;Diabetic ulcer Toe (Comment  which one) Anterior;Right Black Eschar. unable to view 04/24/22  2321  Toe (Comment  which one)  112            Intake/Output Last 24 hours  Intake/Output Summary (Last 24 hours) at 08/14/2022 0024  Last data filed at 08/13/2022 2256 Gross per 24 hour  Intake 450.08 ml  Output --  Net 450.08 ml    Labs/Imaging Results for orders placed or performed during the hospital encounter of 08/13/22 (from the past 48 hour(s))  Lactic acid, plasma     Status: Abnormal   Collection Time: 08/13/22  2:52 PM  Result Value Ref Range   Lactic Acid, Venous 2.3 (HH) 0.5 - 1.9 mmol/L    Comment: RESULT CALLED TO, READ BACK BY AND VERIFIED WITH Vidal Schwalbe, RN @ [phone] 08/13/22 BY [facility] Performed at [facility] Lab, [address], [place], [place] [address]   Comprehensive metabolic panel     Status: Abnormal   Collection Time: 08/13/22  2:52 PM  Result Value Ref Range   Sodium 140 135 - 145 mmol/L   Potassium 4.0 3.5 - 5.1 mmol/L   Chloride 101 98 - 111 mmol/L   CO2 26 22 - 32 mmol/L   Glucose, Bld 265 (H) 70 - 99 mg/dL    Comment: Glucose reference range applies only to samples taken after fasting for at least 8 hours.   BUN 22 8 - 23 mg/dL   Creatinine, Ser 4.09 (H) 0.44 - 1.00 mg/dL   Calcium 9.1 8.9 - 81.1 mg/dL   Total Protein  7.6 6.5 - 8.1 g/dL   Albumin 3.1 (L) 3.5 - 5.0 g/dL   AST 27 15 - 41 U/L   ALT 28 0 - 44 U/L   Alkaline Phosphatase 112 38 - 126 U/L   Total Bilirubin 0.4 0.3 - 1.2 mg/dL   GFR, Estimated 9 (L) >60 mL/min    Comment: (NOTE) Calculated using the CKD-EPI Creatinine Equation (2021)    Anion gap 13 5 - 15    Comment: Performed at [facility] Lab, [address] [place], [place] [address]  CBC with Differential     Status: Abnormal   Collection Time: 08/13/22  2:52 PM  Result Value Ref Range   WBC 9.3 4.0 - 10.5 K/uL   RBC 3.80 (L) 3.87 - 5.11 MIL/uL   Hemoglobin 9.2 (L) 12.0 - 15.0 g/dL   HCT 29.5 (L) 62.1 - 30.8 %   MCV 81.3 80.0 - 100.0 fL   MCH 24.2 (L) 26.0 - 34.0 pg   MCHC 29.8 (L) 30.0 - 36.0 g/dL   RDW 65.7 (H) 84.6 - 96.2 %   Platelets 315 150 - 400 K/uL   nRBC 0.3 (H) 0.0 - 0.2 %   Neutrophils Relative % 66 %   Neutro Abs 6.3 1.7 - 7.7 K/uL   Lymphocytes Relative 21 %   Lymphs Abs 1.9 0.7 - 4.0 K/uL   Monocytes Relative 9 %   Monocytes Absolute 0.8 0.1 - 1.0 K/uL   Eosinophils Relative 2 %   Eosinophils Absolute 0.2 0.0 - 0.5 K/uL   Basophils Relative 1 %   Basophils Absolute 0.1 0.0 - 0.1 K/uL   Immature Granulocytes 1 %   Abs Immature Granulocytes 0.06 0.00 - 0.07 K/uL    Comment: Performed at [facility], [address] [place], [place] [address]  MRSA Next Gen by PCR, Nasal     Status: None   Collection Time: 08/13/22  8:02 PM   Specimen: Nasal Mucosa; Nasal Swab  Result Value Ref Range   MRSA by PCR Next Gen NOT DETECTED NOT DETECTED    Comment: (NOTE) The GeneXpert MRSA Assay (FDA approved for NASAL specimens only), is one  component of a comprehensive MRSA colonization surveillance program. It is not intended to diagnose MRSA infection nor to guide or monitor treatment for MRSA infections. Test performance is not FDA approved in patients less than 20 years old. Performed at [facility] Lab, [address] [place], [place] [address]    Sedimentation rate     Status: Abnormal   Collection Time: 08/13/22  8:26 PM  Result Value Ref Range   Sed Rate 79 (H) 0 - 22 mm/hr    Comment: Performed at [facility] Lab, [address], [place], [place] [address]  C-reactive protein     Status: Abnormal   Collection Time: 08/13/22  8:26 PM  Result Value Ref Range   CRP 9.1 (H) <1.0 mg/dL    Comment: Performed at [facility] Lab, [address] [place], [place] [address]  Prealbumin     Status: None   Collection Time: 08/13/22  8:26 PM  Result Value Ref Range   Prealbumin 26 18 - 38 mg/dL    Comment: Performed at [facility] Lab, [address] [place], [place] [address]   *Note: Due to a large number of results and/or encounters for the requested time period, some results have not been displayed. A complete set of results can be found in Results Review.   DG Foot Complete Left  Result Date: 08/13/2022 Please see detailed radiograph report in office note.  DG Chest 2 View  Result Date: 08/13/2022 CLINICAL DATA:  Toe infection. EXAM: CHEST - 2 VIEW COMPARISON:  Chest radiograph 04/24/2022 FINDINGS: The cardiac silhouette remains mildly enlarged. Lung volumes are low. No acute airspace opacity, edema, sizable pleural effusion, or pneumothorax is identified. Scarring is again noted in the left mid to lower lung. No acute osseous abnormality is seen. IMPRESSION: No active cardiopulmonary disease. Electronically Signed   By: Sebastian Ache M.D.   On: 08/13/2022 15:28    Pending Labs Unresulted Labs (From admission, onward)     Start     Ordered   08/14/22 0500  CBC  Tomorrow morning,   R        08/13/22 2014   08/14/22 0500  Basic metabolic panel  Tomorrow morning,   R        08/13/22 2014   08/13/22 1907  Blood culture (routine x 2)  BLOOD CULTURE X 2,   R (with STAT occurrences)      08/13/22 1909            Vitals/Pain Today's Vitals   08/13/22 2245 08/13/22 2254 08/13/22 2257 08/14/22 0024  BP: (!) 161/68  (!) 161/68  (!) 155/65  Pulse: 77 77  74  Resp:  16    Temp:  98.1 F (36.7 C)    TempSrc:  Oral    SpO2: 97% 97%  98%  PainSc:   0-No pain 0-No pain    Isolation Precautions No active isolations  Medications Medications  cefTRIAXone (ROCEPHIN) 2 g in sodium chloride 0.9 % 100 mL IVPB (0 g Intravenous Stopped 08/13/22 2052)  metroNIDAZOLE (FLAGYL) tablet 500 mg (500 mg Oral Given 08/13/22 2149)  vancomycin (VANCOCIN) IVPB 1000 mg/200 mL premix (has no administration in time range)  heparin injection 5,000 Units (5,000 Units Subcutaneous Given 08/13/22 2150)  acetaminophen (TYLENOL) tablet 650 mg (has no administration in time range)    Or  acetaminophen (TYLENOL) suppository 650 mg (has no administration in time range)  ondansetron (ZOFRAN) tablet 4 mg (has no administration  in time range)    Or  ondansetron (ZOFRAN) injection 4 mg (has no administration in time range)  aspirin chewable tablet 81 mg (has no administration in time range)  ketotifen (ZADITOR) 0.035 % ophthalmic solution 1 drop (1 drop Both Eyes Not Given 08/13/22 2327)  calcitRIOL (ROCALTROL) capsule 0.25 mcg (has no administration in time range)  DULoxetine (CYMBALTA) DR capsule 60 mg (has no administration in time range)  ezetimibe (ZETIA) tablet 10 mg (has no administration in time range)  furosemide (LASIX) tablet 80 mg (80 mg Oral Given 08/13/22 2249)  insulin glargine-yfgn (SEMGLEE) injection 30 Units (has no administration in time range)  metoprolol succinate (TOPROL-XL) 24 hr tablet 50 mg (has no administration in time range)  hydrOXYzine (ATARAX) tablet 25 mg (has no administration in time range)  oxyCODONE-acetaminophen (PERCOCET/ROXICET) 5-325 MG per tablet 1 tablet (has no administration in time range)  sucroferric oxyhydroxide ([name]) chewable tablet 1,000 mg (has no administration in time range)  sertraline (ZOLOFT) tablet 50 mg (has no administration in time range)  pregabalin (LYRICA) capsule 50 mg  (50 mg Oral Given 08/13/22 2249)  insulin aspart (novoLOG) injection 0-9 Units (has no administration in time range)  insulin aspart (novoLOG) injection 3 Units (has no administration in time range)  sucroferric oxyhydroxide ([name]) chewable tablet 500 mg (has no administration in time range)  vancomycin (VANCOREADY) IVPB 1750 mg/350 mL (0 mg Intravenous Stopped 08/13/22 2256)    Mobility non-ambulatory     Focused Assessments Cardiac Assessment Handoff:  Cardiac Rhythm: Normal sinus rhythm Lab Results  Component Value Date   CKTOTAL 224 (H) 04/25/2015   CKMB 0.9 04/25/2015   TROPONINI <0.03 09/12/2018   Lab Results  Component Value Date   DDIMER 1.57 (H) 04/07/2019   Does the Patient currently have chest pain? No    R Recommendations: See Admitting Provider Note  Report given to:   Additional Notes: recent rt BKA leg amputation, for sx on ;left toe on Saturday. Left foot wrapped. Pt A+Ox4

## 2022-08-14 NOTE — Plan of Care (Signed)
  Problem: Education: Goal: Understanding of CV disease, CV risk reduction, and recovery process will improve Outcome: Progressing   Problem: Metabolic: Goal: Ability to maintain appropriate glucose levels will improve Outcome: Progressing   Problem: Nutritional: Goal: Maintenance of adequate nutrition will improve Outcome: Progressing

## 2022-08-14 NOTE — Progress Notes (Signed)
Pt receives out-pt HD at [facility] on MWF. Pt arrives at 6:15 am for 6:30 am chair time. Will assist as needed.   Olivia Canter Renal Navigator [phone]

## 2022-08-15 ENCOUNTER — Encounter (HOSPITAL_COMMUNITY)
Admission: EM | Disposition: A | Discharge status: Skilled Nursing Facility | Payer: Self-pay | Source: Home / Self Care | Attending: Family Medicine

## 2022-08-15 ENCOUNTER — Other Ambulatory Visit: Payer: Self-pay

## 2022-08-15 ENCOUNTER — Inpatient Hospital Stay (HOSPITAL_COMMUNITY): Payer: Medicare PPO | Admitting: Anesthesiology

## 2022-08-15 ENCOUNTER — Encounter (HOSPITAL_COMMUNITY): Payer: Self-pay | Admitting: Internal Medicine

## 2022-08-15 DIAGNOSIS — N186 End stage renal disease: Secondary | ICD-10-CM

## 2022-08-15 DIAGNOSIS — I132 Hypertensive heart and chronic kidney disease with heart failure and with stage 5 chronic kidney disease, or end stage renal disease: Secondary | ICD-10-CM

## 2022-08-15 DIAGNOSIS — M24572 Contracture, left ankle: Secondary | ICD-10-CM

## 2022-08-15 DIAGNOSIS — E1169 Type 2 diabetes mellitus with other specified complication: Secondary | ICD-10-CM | POA: Diagnosis not present

## 2022-08-15 DIAGNOSIS — M86172 Other acute osteomyelitis, left ankle and foot: Secondary | ICD-10-CM | POA: Diagnosis not present

## 2022-08-15 DIAGNOSIS — E1151 Type 2 diabetes mellitus with diabetic peripheral angiopathy without gangrene: Secondary | ICD-10-CM

## 2022-08-15 DIAGNOSIS — Z992 Dependence on renal dialysis: Secondary | ICD-10-CM

## 2022-08-15 DIAGNOSIS — Z794 Long term (current) use of insulin: Secondary | ICD-10-CM | POA: Diagnosis not present

## 2022-08-15 DIAGNOSIS — I509 Heart failure, unspecified: Secondary | ICD-10-CM

## 2022-08-15 HISTORY — PX: TRANSMETATARSAL AMPUTATION: SHX6197

## 2022-08-15 HISTORY — PX: ACHILLES TENDON LENGTHENING: SHX6455

## 2022-08-15 LAB — GLUCOSE, CAPILLARY
Glucose-Capillary: 107 mg/dL — ABNORMAL HIGH (ref 70–99)
Glucose-Capillary: 101 mg/dL — ABNORMAL HIGH (ref 70–99)
Glucose-Capillary: 105 mg/dL — ABNORMAL HIGH (ref 70–99)
Glucose-Capillary: 83 mg/dL (ref 70–99)
Glucose-Capillary: 112 mg/dL — ABNORMAL HIGH (ref 70–99)
Glucose-Capillary: 200 mg/dL — ABNORMAL HIGH (ref 70–99)
Glucose-Capillary: 199 mg/dL — ABNORMAL HIGH (ref 70–99)

## 2022-08-15 LAB — RENAL FUNCTION PANEL
Albumin: 2.7 g/dL — ABNORMAL LOW (ref 3.5–5.0)
Anion gap: 12 (ref 5–15)
BUN: 13 mg/dL (ref 8–23)
CO2: 28 mmol/L (ref 22–32)
Calcium: 9 mg/dL (ref 8.9–10.3)
Chloride: 97 mmol/L — ABNORMAL LOW (ref 98–111)
Creatinine, Ser: 3.62 mg/dL — ABNORMAL HIGH (ref 0.44–1.00)
GFR, Estimated: 13 mL/min — ABNORMAL LOW (ref >60)
Glucose, Bld: 101 mg/dL — ABNORMAL HIGH (ref 70–99)
Phosphorus: 2.5 mg/dL (ref 2.5–4.6)
Potassium: 3.6 mmol/L (ref 3.5–5.1)
Sodium: 137 mmol/L (ref 135–145)

## 2022-08-15 LAB — CULTURE, BLOOD (ROUTINE X 2)

## 2022-08-15 SURGERY — AMPUTATION, FOOT, TRANSMETATARSAL
Anesthesia: General | Site: Toe | Laterality: Left

## 2022-08-15 MED ORDER — FENTANYL CITRATE (PF) 250 MCG/5ML IJ SOLN
INTRAMUSCULAR | Status: DC | PRN
  Administered 2022-08-15: 25 ug via INTRAVENOUS

## 2022-08-15 MED ORDER — PROMETHAZINE HCL 25 MG/ML IJ SOLN: 6.2500 mg | INTRAMUSCULAR | Status: DC | PRN

## 2022-08-15 MED ORDER — DEXAMETHASONE SODIUM PHOSPHATE 10 MG/ML IJ SOLN
INTRAMUSCULAR | Status: DC | PRN
  Administered 2022-08-15: 4 mg via INTRAVENOUS

## 2022-08-15 MED ORDER — ONDANSETRON HCL 4 MG/2ML IJ SOLN
INTRAMUSCULAR | Status: AC
  Filled 2022-08-15: qty 2

## 2022-08-15 MED ORDER — LIDOCAINE 2% (20 MG/ML) 5 ML SYRINGE
INTRAMUSCULAR | Status: AC
  Filled 2022-08-15: qty 5

## 2022-08-15 MED ORDER — SODIUM CHLORIDE 0.9 % IV SOLN: INTRAVENOUS | Status: DC

## 2022-08-15 MED ORDER — PROPOFOL 10 MG/ML IV BOLUS
INTRAVENOUS | Status: DC | PRN
  Administered 2022-08-15: 130 mg via INTRAVENOUS

## 2022-08-15 MED ORDER — PHENYLEPHRINE 80 MCG/ML (10ML) SYRINGE FOR IV PUSH (FOR BLOOD PRESSURE SUPPORT)
PREFILLED_SYRINGE | INTRAVENOUS | Status: AC
  Filled 2022-08-15: qty 10

## 2022-08-15 MED ORDER — OXYCODONE HCL 5 MG PO TABS: 5.0000 mg | ORAL_TABLET | Freq: Once | ORAL | Status: DC | PRN

## 2022-08-15 MED ORDER — PHENYLEPHRINE HCL-NACL 20-0.9 MG/250ML-% IV SOLN
INTRAVENOUS | Status: DC | PRN
  Administered 2022-08-15: 30 ug/min via INTRAVENOUS

## 2022-08-15 MED ORDER — DEXAMETHASONE SODIUM PHOSPHATE 10 MG/ML IJ SOLN
INTRAMUSCULAR | Status: AC
  Filled 2022-08-15: qty 1

## 2022-08-15 MED ORDER — FENTANYL CITRATE (PF) 250 MCG/5ML IJ SOLN
INTRAMUSCULAR | Status: AC
  Filled 2022-08-15: qty 5

## 2022-08-15 MED ORDER — BUPIVACAINE HCL (PF) 0.25 % IJ SOLN
INTRAMUSCULAR | Status: AC
  Filled 2022-08-15: qty 30

## 2022-08-15 MED ORDER — CHLORHEXIDINE GLUCONATE 0.12 % MT SOLN
OROMUCOSAL | Status: AC
  Administered 2022-08-15: 15 mL via OROMUCOSAL
  Filled 2022-08-15: qty 15

## 2022-08-15 MED ORDER — ACETAMINOPHEN 160 MG/5ML PO SOLN: 325.0000 mg | ORAL | Status: DC | PRN

## 2022-08-15 MED ORDER — CEPHALEXIN 500 MG PO CAPS
500.0000 mg | ORAL_CAPSULE | Freq: Two times a day (BID) | ORAL | Status: DC
  Administered 2022-08-15 – 2022-08-20 (×10): 500 mg via ORAL
  Filled 2022-08-15 (×10): qty 1

## 2022-08-15 MED ORDER — BUPIVACAINE HCL 0.25 % IJ SOLN
INTRAMUSCULAR | Status: DC | PRN
  Administered 2022-08-15: 20 mL

## 2022-08-15 MED ORDER — ALBUMIN HUMAN 5 % IV SOLN: INTRAVENOUS | Status: DC | PRN

## 2022-08-15 MED ORDER — PHENYLEPHRINE 80 MCG/ML (10ML) SYRINGE FOR IV PUSH (FOR BLOOD PRESSURE SUPPORT)
PREFILLED_SYRINGE | INTRAVENOUS | Status: DC | PRN
  Administered 2022-08-15: 80 ug via INTRAVENOUS
  Administered 2022-08-15 (×6): 160 ug via INTRAVENOUS

## 2022-08-15 MED ORDER — PROPOFOL 10 MG/ML IV BOLUS
INTRAVENOUS | Status: AC
  Filled 2022-08-15: qty 20

## 2022-08-15 MED ORDER — ACETAMINOPHEN 10 MG/ML IV SOLN: 1000.0000 mg | Freq: Once | INTRAVENOUS | Status: DC | PRN

## 2022-08-15 MED ORDER — ACETAMINOPHEN 325 MG PO TABS: 325.0000 mg | ORAL_TABLET | ORAL | Status: DC | PRN

## 2022-08-15 MED ORDER — ACETAMINOPHEN 500 MG PO TABS: 1000.0000 mg | ORAL_TABLET | Freq: Once | ORAL | Status: AC

## 2022-08-15 MED ORDER — CHLORHEXIDINE GLUCONATE 0.12 % MT SOLN: 15.0000 mL | Freq: Once | OROMUCOSAL | Status: AC

## 2022-08-15 MED ORDER — FENTANYL CITRATE (PF) 100 MCG/2ML IJ SOLN: 25.0000 ug | INTRAMUSCULAR | Status: DC | PRN

## 2022-08-15 MED ORDER — OXYCODONE HCL 5 MG/5ML PO SOLN: 5.0000 mg | Freq: Once | ORAL | Status: DC | PRN

## 2022-08-15 MED ORDER — ROCURONIUM BROMIDE 10 MG/ML (PF) SYRINGE
PREFILLED_SYRINGE | INTRAVENOUS | Status: AC
  Filled 2022-08-15: qty 10

## 2022-08-15 MED ORDER — LIDOCAINE 2% (20 MG/ML) 5 ML SYRINGE
INTRAMUSCULAR | Status: DC | PRN
  Administered 2022-08-15: 40 mg via INTRAVENOUS

## 2022-08-15 MED ORDER — DARBEPOETIN ALFA 150 MCG/0.3ML IJ SOSY
150.0000 ug | PREFILLED_SYRINGE | INTRAMUSCULAR | Status: DC
  Administered 2022-08-15: 150 ug via SUBCUTANEOUS
  Filled 2022-08-15: qty 0.3

## 2022-08-15 MED ORDER — ONDANSETRON HCL 4 MG/2ML IJ SOLN
INTRAMUSCULAR | Status: DC | PRN
  Administered 2022-08-15: 4 mg via INTRAVENOUS

## 2022-08-15 MED ORDER — 0.9 % SODIUM CHLORIDE (POUR BTL) OPTIME
TOPICAL | Status: DC | PRN
  Administered 2022-08-15: 1000 mL

## 2022-08-15 MED ORDER — ORAL CARE MOUTH RINSE: 15.0000 mL | Freq: Once | OROMUCOSAL | Status: AC

## 2022-08-15 MED ORDER — INSULIN ASPART 100 UNIT/ML IJ SOLN: 0.0000 [IU] | INTRAMUSCULAR | Status: DC | PRN

## 2022-08-15 MED ORDER — ACETAMINOPHEN 500 MG PO TABS
ORAL_TABLET | ORAL | Status: AC
  Administered 2022-08-15: 1000 mg via ORAL
  Filled 2022-08-15: qty 2

## 2022-08-15 SURGICAL SUPPLY — 45 items
APL SKNCLS STERI-STRIP NONHPOA (GAUZE/BANDAGES/DRESSINGS)
BAG COUNTER SPONGE SURGICOUNT (BAG) ×2 IMPLANT
BAG SPNG CNTER NS LX DISP (BAG) ×2
BENZOIN TINCTURE PRP APPL 2/3 (GAUZE/BANDAGES/DRESSINGS) ×2 IMPLANT
BLADE LONG MED 31X9 (MISCELLANEOUS) IMPLANT
BLADE SAW SGTL HD 18.5X60.5X1. (BLADE) ×2 IMPLANT
BLADE SURG 21 STRL SS (BLADE) ×2 IMPLANT
BNDG CMPR 5X4 KNIT ELC UNQ LF (GAUZE/BANDAGES/DRESSINGS) ×2
BNDG COHESIVE 4X5 TAN STRL (GAUZE/BANDAGES/DRESSINGS) IMPLANT
BNDG ELASTIC 4INX 5YD STR LF (GAUZE/BANDAGES/DRESSINGS) IMPLANT
BNDG GAUZE DERMACEA FLUFF 4 (GAUZE/BANDAGES/DRESSINGS) IMPLANT
BNDG GZE DERMACEA 4 6PLY (GAUZE/BANDAGES/DRESSINGS)
COVER SURGICAL LIGHT HANDLE (MISCELLANEOUS) ×2 IMPLANT
DRAPE INCISE IOBAN 66X45 STRL (DRAPES) ×2 IMPLANT
DRAPE U-SHAPE 47X51 STRL (DRAPES) ×2 IMPLANT
DRESSING PEEL AND PLC PRVNA 13 (GAUZE/BANDAGES/DRESSINGS) IMPLANT
DRSG ADAPTIC 3X8 NADH LF (GAUZE/BANDAGES/DRESSINGS) IMPLANT
DRSG PEEL AND PLACE PREVENA 13 (GAUZE/BANDAGES/DRESSINGS) ×2
DRSG XEROFORM 1X8 (GAUZE/BANDAGES/DRESSINGS) IMPLANT
DURAPREP 26ML APPLICATOR (WOUND CARE) ×2 IMPLANT
ELECT REM PT RETURN 9FT ADLT (ELECTROSURGICAL) ×2
ELECTRODE REM PT RTRN 9FT ADLT (ELECTROSURGICAL) ×2 IMPLANT
GAUZE PAD ABD 8X10 STRL (GAUZE/BANDAGES/DRESSINGS) IMPLANT
GAUZE SPONGE 4X4 12PLY STRL (GAUZE/BANDAGES/DRESSINGS) IMPLANT
GLOVE BIOGEL PI IND STRL 9 (GLOVE) ×2 IMPLANT
GLOVE SURG ORTHO 9.0 STRL STRW (GLOVE) ×2 IMPLANT
GOWN STRL REUS W/ TWL XL LVL3 (GOWN DISPOSABLE) ×6 IMPLANT
GOWN STRL REUS W/TWL XL LVL3 (GOWN DISPOSABLE) ×6
KIT BASIN OR (CUSTOM PROCEDURE TRAY) ×2 IMPLANT
KIT PUMP PREVENA PLUS 14DAY (MISCELLANEOUS) IMPLANT
KIT TURNOVER KIT B (KITS) ×2 IMPLANT
NS IRRIG 1000ML POUR BTL (IV SOLUTION) ×2 IMPLANT
PACK ORTHO EXTREMITY (CUSTOM PROCEDURE TRAY) ×2 IMPLANT
PAD ARMBOARD 7.5X6 YLW CONV (MISCELLANEOUS) ×4 IMPLANT
SPONGE T-LAP 18X18 ~~LOC~~+RFID (SPONGE) IMPLANT
STAPLER VISISTAT 35W (STAPLE) IMPLANT
SUT ETHILON 2 0 PSLX (SUTURE) ×4 IMPLANT
SUT ETHILON 3 0 PS 1 (SUTURE) IMPLANT
SUT MNCRL AB 3-0 PS2 27 (SUTURE) IMPLANT
SUT PDS AB 2-0 CT1 27 (SUTURE) IMPLANT
TOWEL GREEN STERILE (TOWEL DISPOSABLE) ×2 IMPLANT
TOWEL GREEN STERILE FF (TOWEL DISPOSABLE) ×2 IMPLANT
TUBE CONNECTING 12X1/4 (SUCTIONS) ×2 IMPLANT
WATER STERILE IRR 1000ML POUR (IV SOLUTION) ×2 IMPLANT
YANKAUER SUCT BULB TIP NO VENT (SUCTIONS) ×2 IMPLANT

## 2022-08-15 NOTE — Transfer of Care (Signed)
Immediate Anesthesia Transfer of Care Note  Patient: Kathryn Beck  Procedure(s) Performed: TRANSMETATARSAL AMPUTATION (Left: Toe) ACHILLES TENDON LENGTHENING  Patient Location: PACU  Anesthesia Type:General  Level of Consciousness: drowsy and patient cooperative  Airway & Oxygen Therapy: Patient Spontanous Breathing and Patient connected to face mask oxygen  Post-op Assessment: Report given to RN and Post -op Vital signs reviewed and stable  Post vital signs: Reviewed and stable  Last Vitals:  Vitals Value Taken Time  BP 137/61 08/15/22 1215  Temp    Pulse 73 08/15/22 1215  Resp 15 08/15/22 1215  SpO2 100 % 08/15/22 1215  Vitals shown include unvalidated device data.  Last Pain:  Vitals:   08/15/22 1016  TempSrc:   PainSc: 0-No pain         Complications: No notable events documented.

## 2022-08-15 NOTE — Brief Op Note (Signed)
08/15/2022  12:04 PM  PATIENT:  Kathryn Beck  67 y.o. female  PRE-OPERATIVE DIAGNOSIS:  Acute osteomyelitis of toe of left foot  POST-OPERATIVE DIAGNOSIS:  Acute osteomyelitis of toe of left foot  PROCEDURE:  Procedure(s): TRANSMETATARSAL AMPUTATION (Left) ACHILLES TENDON LENGTHENING  SURGEON:  Surgeon(s) and Role:    * Lamiracle Chaidez, Rachelle Hora, DPM - Primary  PHYSICIAN ASSISTANT:   ASSISTANTS: none   ANESTHESIA:   none  EBL:  100 mL   BLOOD ADMINISTERED:none  DRAINS: none   LOCAL MEDICATIONS USED:  MARCAINE    and Amount: 20 ml  SPECIMEN:  left forefoot  DISPOSITION OF SPECIMEN:  PATHOLOGY  COUNTS:  YES  TOURNIQUET:  * No tourniquets in log *  DICTATION: .Note written in EPIC  PLAN OF CARE: Admit to inpatient   PATIENT DISPOSITION:  PACU - hemodynamically stable.   Delay start of Pharmacological VTE agent (>24hrs) due to surgical blood loss or risk of bleeding: no  - NWB to LLE in CAM boot - ordered. Boot may be removed while in bed and resting but should be on while and up during transfers - Offload heel - No further surgical plans - PT ordered - Perfusion adequate for healing at this time - Diet advanced - 7 Days keflex renal dosing - Prevena VAC will be removed at office visit next week

## 2022-08-15 NOTE — Plan of Care (Signed)
  Problem: Skin Integrity: Goal: Risk for impaired skin integrity will decrease Outcome: Not Progressing   

## 2022-08-15 NOTE — Progress Notes (Signed)
Patient ID: Kathryn Beck, female   DOB: May 20, 1955, 67 y.o.   MRN: [id number] [facility] Progress Note   Assessment/ Plan:   1.  Nonhealing left lower extremity foot/toe ulcers: Status post efforts at revascularization and outpatient antibiotic as an outpatient with poor response.  She has ongoing intravenous antibiotics for osteomyelitis with plans for left foot TMA today. 2.  End-stage renal disease: Usually on a MWF dialysis schedule, underwent hemodialysis yesterday. 3.  Hypertension: Blood pressures marginally elevated likely associated with pain, continue to monitor on oral antihypertensive therapy and ultrafiltration with hemodialysis. 4.  Anemia of chronic disease: Hemoglobin and hematocrit are marginal with anemia likely exacerbated by ESA resistance in the setting of nonhealing wounds.  Darbepoetin ordered for today evening. 5.  Secondary hyperparathyroidism: Calcium and phosphorus levels both at goal, continue [name] for phosphorus binding as well as calcitriol for PTH control.  Subjective:   Tolerated hemodialysis yesterday without problems.  Awaiting TMA left foot today by podiatry.   Objective:   BP (!) 150/56 (BP Location: Right Arm)   Pulse 80   Temp 98 F (36.7 C) (Oral)   Resp 16   Wt 80.6 kg   SpO2 97%   BMI 30.50 kg/m   Physical Exam: Gen: Comfortably resting in bed, family at bedside CVS: Pulse regular rhythm, normal rate, S1 and S2 normal Resp: Clear to auscultation bilaterally, no distinct rales or rhonchi Abd: Soft, flat, nontender, bowel sounds normal Ext: Left upper arm fistula with intact dressings.  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  Labs: BMET Recent Labs  Lab 08/13/22 1452 08/14/22 0857 08/15/22 0612  NA 140 141 137  K 4.0 4.2 3.6  CL 101 101 97*  CO2 26 25 28   GLUCOSE 265* 140* 101*  BUN 22 31* 13  CREATININE 5.20* 6.27* 3.62*  CALCIUM 9.1 9.1 9.0  PHOS  --  4.9* 2.5   CBC Recent Labs  Lab  08/13/22 1452 08/14/22 0841  WBC 9.3 8.7  NEUTROABS 6.3  --   HGB 9.2* 8.7*  HCT 30.9* 29.5*  MCV 81.3 80.8  PLT 315 301      Medications:     [START ON 08/16/2022] aspirin  162 mg Oral Daily   aspirin  81 mg Oral Daily   calcitRIOL  0.25 mcg Oral BID WC   DULoxetine  60 mg Oral Daily   ezetimibe  10 mg Oral Daily   feeding supplement (NEPRO CARB STEADY)  237 mL Oral Q24H   furosemide  80 mg Oral BID   heparin  5,000 Units Subcutaneous Q8H   insulin aspart  0-9 Units Subcutaneous TID WC   insulin aspart  3 Units Subcutaneous TID WC   insulin glargine-yfgn  20 Units Subcutaneous Daily   ketotifen  1 drop Both Eyes BID   metoprolol succinate  50 mg Oral Daily   [START ON 08/16/2022] nutrition supplement (JUVEN)  1 packet Oral BID BM   pregabalin  50 mg Oral TID   sertraline  50 mg Oral Daily   sucroferric oxyhydroxide  1,000 mg Oral TID WC   sucroferric oxyhydroxide  500 mg Oral With snacks    Zetta Bills, MD 08/15/2022, 8:14 AM

## 2022-08-15 NOTE — Anesthesia Postprocedure Evaluation (Signed)
Anesthesia Post Note  Patient: Kathryn Beck  Procedure(s) Performed: TRANSMETATARSAL AMPUTATION (Left: Toe) ACHILLES TENDON LENGTHENING     Patient location during evaluation: PACU Anesthesia Type: General Level of consciousness: awake and alert Pain management: pain level controlled Vital Signs Assessment: post-procedure vital signs reviewed and stable Respiratory status: spontaneous breathing, nonlabored ventilation, respiratory function stable and patient connected to nasal cannula oxygen Cardiovascular status: blood pressure returned to baseline and stable Postop Assessment: no apparent nausea or vomiting Anesthetic complications: no  No notable events documented.  Last Vitals:  Vitals:   08/15/22 1430 08/15/22 1500  BP: 132/70 (!) 142/62  Pulse: 69 68  Resp: 12 16  Temp:  36.8 C  SpO2: 100% 99%    Last Pain:  Vitals:   08/15/22 1500  TempSrc: Oral  PainSc:                  [name]

## 2022-08-15 NOTE — Anesthesia Preprocedure Evaluation (Addendum)
Anesthesia Evaluation  Patient identified by MRN, date of birth, ID band Patient awake    Reviewed: Allergy & Precautions, NPO status , Patient's Chart, lab work & pertinent test results  Airway Mallampati: III  TM Distance: <3 FB Neck ROM: Full    Dental  (+) Teeth Intact, Dental Advisory Given   Pulmonary sleep apnea    breath sounds clear to auscultation       Cardiovascular hypertension, Pt. on home beta blockers + Peripheral Vascular Disease and +CHF  + Valvular Problems/Murmurs  Rhythm:Regular Rate:Normal     Neuro/Psych  Headaches PSYCHIATRIC DISORDERS  Depression     Neuromuscular disease CVA    GI/Hepatic Neg liver ROS,GERD  ,,  Endo/Other  diabetes, Type 2, Insulin Dependent    Renal/GU ESRF and DialysisRenal disease     Musculoskeletal  (+) Arthritis ,    Abdominal   Peds  Hematology   Anesthesia Other Findings   Reproductive/Obstetrics                             Anesthesia Physical Anesthesia Plan  ASA: 3  Anesthesia Plan: General   Post-op Pain Management: Tylenol PO (pre-op)*   Induction: Intravenous  PONV Risk Score and Plan: 4 or greater and Ondansetron, Dexamethasone, Midazolam and Treatment may vary due to age or medical condition  Airway Management Planned: LMA  Additional Equipment: None  Intra-op Plan:   Post-operative Plan: Extubation in OR  Informed Consent: I have reviewed the patients History and Physical, chart, labs and discussed the procedure including the risks, benefits and alternatives for the proposed anesthesia with the patient or authorized representative who has indicated his/her understanding and acceptance.     Dental advisory given  Plan Discussed with: CRNA  Anesthesia Plan Comments:        Anesthesia Quick Evaluation

## 2022-08-15 NOTE — Anesthesia Procedure Notes (Signed)
Procedure Name: LMA Insertion Date/Time: 08/15/2022 10:59 AM  Performed by: Audie Pinto, CRNAPre-anesthesia Checklist: Patient identified, Emergency Drugs available, Suction available and Patient being monitored Patient Re-evaluated:Patient Re-evaluated prior to induction Oxygen Delivery Method: Circle system utilized Preoxygenation: Pre-oxygenation with 100% oxygen Induction Type: IV induction LMA: LMA inserted LMA Size: 4.0 Placement Confirmation: positive ETCO2 Dental Injury: Teeth and Oropharynx as per pre-operative assessment

## 2022-08-15 NOTE — Plan of Care (Signed)
  Problem: Activity: Goal: Ability to return to baseline activity level will improve Outcome: Progressing   Problem: Fluid Volume: Goal: Ability to maintain a balanced intake and output will improve Outcome: Progressing   Problem: Metabolic: Goal: Ability to maintain appropriate glucose levels will improve Outcome: Progressing   Problem: Nutritional: Goal: Maintenance of adequate nutrition will improve Outcome: Progressing

## 2022-08-15 NOTE — Progress Notes (Signed)
History and Physical Interval Note:  08/15/2022 9:59 AM  Kathryn Beck  has presented today for surgery, with the diagnosis of gangrene of left foot.  The various methods of treatment have been discussed with the patient and family. After consideration of risks, benefits and other options for treatment, the patient has consented to   Procedure(s): TRANSMETATARSAL AMPUTATION (Left) as a surgical intervention.  The patient's history has been reviewed, patient examined, no change in status, stable for surgery.  I have reviewed the patient's chart and labs.  Questions were answered to the patient's satisfaction.     [name]

## 2022-08-15 NOTE — Progress Notes (Signed)
Pacu RN Report to floor given  Gave report to [employer]. Room: 4E13    Discussed surgery, meds given in OR and Pacu, VS, IV fluids given, EBL, urine output, pain and other pertinent information. Also discussed if pt had any family or friends here or belongings with them.    Pt has a Provena wound vac, continuous at 125, no drainage noted yet, no bleeding from wound site. Pt denies pain. Leg elevated on pillow.   Pt on [place] at 2L. LSC. VSS.   Husband was updated by text messages and was advised to go up to her room around 115pm, to allow pt to be settled on her arrival.   No belongings brought down.   Pt exits my care.

## 2022-08-15 NOTE — Op Note (Signed)
Patient Name: Kathryn Beck DOB: July 04, 1955  MRN: [id number]   Date of Service: 08/13/2022 - 08/15/2022  Surgeon: Dr. Sharl Ma, DPM Assistants: None Pre-operative Diagnosis:  Acute osteomyelitis of toe of left foot Post-operative Diagnosis:  Acute osteomyelitis of toe of left foot Procedures: Left foot transmetatarsal amputation Left foot tendo Achilles lengthening Pathology/Specimens: ID Type Source Tests Collected by Time Destination  1 : Left mid-foot Tissue Path Tissue SURGICAL PATHOLOGY [name], [facility] 08/15/2022 1126    Anesthesia: General anesthesia with ankle block Hemostasis: * No tourniquets in log * Estimated Blood Loss: 100 mL Materials: * No implants in log * Medications: 20 cc 0.25% Marcaine plain Complications: No complications noted  Indications for Procedure:  This is a 67 y.o. female with a history of PAD, diabetes and ESRD on HD, after failing local wound care she developed gangrenous changes of the digits.  Underwent previous revascularization and is maximally optimized for surgery.  Transmetatarsal amputation has been recommended.  I also recommended tendo Achilles lengthening and the rationale for this was discussed with the patient. All risks, benefits and potential complications discussed prior to the procedure. All questions addressed. Informed consent signed and reviewed.      Procedure in Detail: Patient was identified in pre-operative holding area. Formal consent was signed and the left lower extremity was marked. Patient was brought back to the operating room. Anesthesia was induced. The extremity was prepped and draped in the usual sterile fashion. Timeout was taken to confirm patient name, laterality, and procedure prior to incision.   Attention was then directed to the left foot where a fishmouth style incision was created surrounding the distal forefoot.  This was carried deep to subcutaneous tissue, utilizing cautery as needed for  hemostasis.  The distal metatarsal necks were exposed and blunt dissection was used to elevate the soft tissues from the shaft.  A standard transmetatarsal amputation was then carried out with a sagittal saw at each metatarsal level with an appropriate level.  The plantar incision and flap was created and completed in the distal forefoot and metatarsal heads and necks were resected and sent for pathology.  The wound was then thoroughly irrigated with 3 L of saline with a pulse irrigator.  Once this was complete hemostasis was completed.  The wound was then closed in layers with 2-0 PDS, 3-0 Monocryl and skin staples.  Perfusion of the flaps were adequate, I was able to maintain the perforator in the first interspace.  I then directed my attention to the posterior Achilles.  A #11 blade was used to create a percutaneous Hoke triple hemisection tendo Achilles lengthening approximately 1, 3 and 5 cm proximal to the insertion of the Achilles on the calcaneus.  The foot was placed under gentle dorsiflexion and the tendon was lengthened to an appropriate length.  These incisions were then irrigated and closed with 3-0 nylon.   The foot was then dressed with Xeroform over the nylon sutures, a Prevena incisional wound VAC was then applied across the amputation site. Patient tolerated the procedure well.   Disposition: Following a period of post-operative monitoring, patient will be transferred to the floor.  Her perfusion was excellent considering her single-vessel runoff I am hopeful that this will be able to heal appropriately.  She will be nonweightbearing for 2 to 3 weeks while her sutures heal.  There were no overt signs of infection at the amputation margin, we will place her on 7 days of oral antibiotics for soft tissue  coverage and optimal wound healing.

## 2022-08-15 NOTE — Progress Notes (Signed)
PROGRESS NOTE    Kathryn Beck  ZOX:[id number] DOB: Sep 03, 1955 DOA: 08/13/2022 PCP: [name], MD   Brief Narrative:  Kathryn Beck is a 67 y.o. female with medical history significant of ESRD on HD, MWF,, HTN, DM2, CVA, recent RLE BKA performed by Dr. Lajoyce Corners 05/29/22, critical left lower extremity ischemia with great toe and second toe ulceration with PVD s/p s/p angiogram with lithotripsy, left superficial femoral artery and popliteal artery Dr Myra Gianotti on aspirin Plavix and Zetia as unable to take statin due to allergy, was seen by podiatry in the office due to nonhealing left foot ulcer and was sent to the hospital for potential surgical intervention which is scheduled on Saturday.  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  Continue current antibiotics.  ESRD on HD: On MWF schedule.  Nephrology on board.  Next dialysis Monday.  Chronic pain syndrome: Continue home Lyrica and Percocet.  Essential hypertension: Controlled.  Continue metoprolol.  Type 2 diabetes mellitus: Currently on 20 units of Semglee and SSI as well as 3 units of NovoLog Premeal 3 times daily.  Blood sugar controlled.  DVT prophylaxis: heparin injection 5,000 Units Start: 08/13/22 2200   Code Status: Full Code  Family Communication: None present at bedside.  Plan of care discussed with patient in length and he/she verbalized understanding and agreed with it.  Status is: Inpatient Remains inpatient appropriate because: Scheduled for surgery today.   Estimated body mass index is 30.5 kg/m as calculated from the following:   Height as of this encounter: 5\' 4"  (1.626 m).   Weight as of this  encounter: 80.6 kg.    Nutritional Assessment: Body mass index is 30.5 kg/m.Marland Kitchen Seen by dietician.  I agree with the assessment and plan as outlined below: Nutrition Status: Nutrition Problem: Inadequate oral intake Etiology: nausea Signs/Symptoms: per patient/family report Interventions: Refer to RD note for recommendations, Education, MVI, Juven, Nepro shake, Liberalize Diet  . Skin Assessment: I have examined the patient's skin and I agree with the wound assessment as performed by the wound care RN as outlined below:    Consultants:  Podiatry  Procedures:  As above  Antimicrobials:  Anti-infectives (From admission, onward)    Start     Dose/Rate Route Frequency Ordered Stop   08/15/22 1800  vancomycin (VANCOCIN) IVPB 1000 mg/200 mL premix  Status:  Discontinued        1,000 mg 200 mL/hr over 60 Minutes Intravenous Every T-Th-Sa (Hemodialysis) 08/14/22 1030 08/14/22 1037   08/14/22 1800  [MAR Hold]  vancomycin (VANCOCIN) IVPB 1000 mg/200 mL premix        (MAR Hold since Sat 08/15/2022 at 1004.Hold Reason: Transfer to a Procedural area)   1,000 mg 200 mL/hr over 60 Minutes Intravenous Every M-W-F (Hemodialysis) 08/14/22 1037     08/14/22 1200  vancomycin (VANCOCIN) IVPB 1000 mg/200 mL premix  Status:  Discontinued        1,000 mg 200 mL/hr over 60 Minutes Intravenous Every M-W-F (Hemodialysis) 08/13/22 1950 08/14/22 1030   08/13/22 2200  metroNIDAZOLE (FLAGYL) tablet 500 mg  Status:  Discontinued        500 mg Oral Every 12 hours  08/13/22 1948 08/14/22 1004   08/13/22 2000  vancomycin (VANCOREADY) IVPB 1750 mg/350 mL        1,750 mg 175 mL/hr over 120 Minutes Intravenous  Once 08/13/22 1949 08/13/22 2256   08/13/22 1952  [MAR Hold]  cefTRIAXone (ROCEPHIN) 2 g in sodium chloride 0.9 % 100 mL IVPB        (MAR Hold since Sat 08/15/2022 at 1004.Hold Reason: Transfer to a Procedural area)   2 g 200 mL/hr over 30 Minutes Intravenous Every 24 hours 08/13/22 1948 08/20/22 1959    08/13/22 1915  ceFEPIme (MAXIPIME) 2 g in sodium chloride 0.9 % 100 mL IVPB  Status:  Discontinued        2 g 200 mL/hr over 30 Minutes Intravenous  Once 08/13/22 1909 08/13/22 1952         Subjective: Seen and examined.  No complaints.  She is ready for the surgery.  Objective: Vitals:   08/15/22 0542 08/15/22 0758 08/15/22 1015 08/15/22 1016  BP:  (!) 150/56 (!) 145/59   Pulse: 81 80 81   Resp: 16 16 16    Temp:  98 F (36.7 C) 99.1 F (37.3 C)   TempSrc:  Oral Oral   SpO2: 96% 97% 95%   Weight:    80.6 kg  Height:    5\' 4"  (1.626 m)    Intake/Output Summary (Last 24 hours) at 08/15/2022 1147 Last data filed at 08/15/2022 1131 Gross per 24 hour  Intake 890.32 ml  Output 100 ml  Net 790.32 ml    Filed Weights   08/15/22 0330 08/15/22 0538 08/15/22 1016  Weight: 82.8 kg 80.6 kg 80.6 kg    Examination:  General exam: Appears calm and comfortable  Respiratory system: Clear to auscultation. Respiratory effort normal. Cardiovascular system: S1 & S2 heard, RRR. No JVD, murmurs, rubs, gallops or clicks. Gastrointestinal system: Abdomen is nondistended, soft and nontender. No organomegaly or masses felt. Normal bowel sounds heard. Central nervous system: Alert and oriented. No focal neurological deficits. Extremities: Dressing in the left foot.  Right BKA. Psychiatry: Judgement and insight appear normal. Mood & affect appropriate.   Data Reviewed: I have personally reviewed following labs and imaging studies  CBC: Recent Labs  Lab 08/13/22 1452 08/14/22 0841  WBC 9.3 8.7  NEUTROABS 6.3  --   HGB 9.2* 8.7*  HCT 30.9* 29.5*  MCV 81.3 80.8  PLT 315 301    Basic Metabolic Panel: Recent Labs  Lab 08/13/22 1452 08/14/22 0857 08/15/22 0612  NA 140 141 137  K 4.0 4.2 3.6  CL 101 101 97*  CO2 26 25 28   GLUCOSE 265* 140* 101*  BUN 22 31* 13  CREATININE 5.20* 6.27* 3.62*  CALCIUM 9.1 9.1 9.0  PHOS  --  4.9* 2.5    GFR: Estimated Creatinine Clearance: 15.7  mL/min (A) (by C-G formula based on SCr of 3.62 mg/dL (H)). Liver Function Tests: Recent Labs  Lab 08/13/22 1452 08/14/22 0857 08/15/22 0612  AST 27  --   --   ALT 28  --   --   ALKPHOS 112  --   --   BILITOT 0.4  --   --   PROT 7.6  --   --   ALBUMIN 3.1* 3.0* 2.7*    No results for input(s): "LIPASE", "AMYLASE" in the last 168 hours. No results for input(s): "AMMONIA" in the last 168 hours. Coagulation Profile: No results for input(s): "INR", "PROTIME" in the last 168 hours. Cardiac Enzymes:  No results for input(s): "CKTOTAL", "CKMB", "CKMBINDEX", "TROPONINI" in the last 168 hours. BNP (last 3 results) No results for input(s): "PROBNP" in the last 8760 hours. HbA1C: No results for input(s): "HGBA1C" in the last 72 hours. CBG: Recent Labs  Lab 08/14/22 1610 08/14/22 2200 08/15/22 0432 08/15/22 0611 08/15/22 0957  GLUCAP 168* 198* 107* 101* 105*    Lipid Profile: No results for input(s): "CHOL", "HDL", "LDLCALC", "TRIG", "CHOLHDL", "LDLDIRECT" in the last 72 hours. Thyroid Function Tests: No results for input(s): "TSH", "T4TOTAL", "FREET4", "T3FREE", "THYROIDAB" in the last 72 hours. Anemia Panel: No results for input(s): "VITAMINB12", "FOLATE", "FERRITIN", "TIBC", "IRON", "RETICCTPCT" in the last 72 hours. Sepsis Labs: Recent Labs  Lab 08/13/22 1452 08/14/22 1122  LATICACIDVEN 2.3* 1.5     Recent Results (from the past 240 hour(s))  Blood culture (routine x 2)     Status: None (Preliminary result)   Collection Time: 08/13/22  7:45 PM   Specimen: BLOOD  Result Value Ref Range Status   Specimen Description BLOOD SITE NOT SPECIFIED  Final   Special Requests   Final    BOTTLES DRAWN AEROBIC AND ANAEROBIC Blood Culture adequate volume   Culture   Final    NO GROWTH 1 DAY Performed at [facility], [address], [place], [place] [address]    Report Status PENDING  Incomplete  Blood culture (routine x 2)     Status: None (Preliminary result)    Collection Time: 08/13/22  7:53 PM   Specimen: BLOOD RIGHT HAND  Result Value Ref Range Status   Specimen Description BLOOD RIGHT HAND  Final   Special Requests   Final    BOTTLES DRAWN AEROBIC AND ANAEROBIC Blood Culture adequate volume   Culture   Final    NO GROWTH 2 DAYS Performed at [facility], [address], [place], [place] [address]    Report Status PENDING  Incomplete  MRSA Next Gen by PCR, Nasal     Status: None   Collection Time: 08/13/22  8:02 PM   Specimen: Nasal Mucosa; Nasal Swab  Result Value Ref Range Status   MRSA by PCR Next Gen NOT DETECTED NOT DETECTED Final    Comment: (NOTE) The GeneXpert MRSA Assay (FDA approved for NASAL specimens only), is one component of a comprehensive MRSA colonization surveillance program. It is not intended to diagnose MRSA infection nor to guide or monitor treatment for MRSA infections. Test performance is not FDA approved in patients less than 33 years old. Performed at [facility], [address] [place], [place] [address]      Radiology Studies: DG Foot Complete Left  Result Date: 08/13/2022 Please see detailed radiograph report in office note.  DG Chest 2 View  Result Date: 08/13/2022 CLINICAL DATA:  Toe infection. EXAM: CHEST - 2 VIEW COMPARISON:  Chest radiograph 04/24/2022 FINDINGS: The cardiac silhouette remains mildly enlarged. Lung volumes are low. No acute airspace opacity, edema, sizable pleural effusion, or pneumothorax is identified. Scarring is again noted in the left mid to lower lung. No acute osseous abnormality is seen. IMPRESSION: No active cardiopulmonary disease. Electronically Signed   By: Sebastian Ache M.D.   On: 08/13/2022 15:28    Scheduled Meds:  [MAR Hold] aspirin  162 mg Oral Daily   aspirin  81 mg Oral Daily   [MAR Hold] calcitRIOL  0.25 mcg Oral BID WC   [MAR Hold] darbepoetin (ARANESP) injection - DIALYSIS  150 mcg Subcutaneous Q Sat-1800   [  MAR Hold] DULoxetine  60 mg Oral Daily    [MAR Hold] ezetimibe  10 mg Oral Daily   [MAR Hold] feeding supplement (NEPRO CARB STEADY)  237 mL Oral Q24H   [MAR Hold] furosemide  80 mg Oral BID   [MAR Hold] heparin  5,000 Units Subcutaneous Q8H   [MAR Hold] insulin aspart  0-9 Units Subcutaneous TID WC   [MAR Hold] insulin aspart  3 Units Subcutaneous TID WC   [MAR Hold] insulin glargine-yfgn  20 Units Subcutaneous Daily   [MAR Hold] ketotifen  1 drop Both Eyes BID   [MAR Hold] metoprolol succinate  50 mg Oral Daily   [MAR Hold] nutrition supplement (JUVEN)  1 packet Oral BID BM   [MAR Hold] pregabalin  50 mg Oral TID   [MAR Hold] sertraline  50 mg Oral Daily   [MAR Hold] sucroferric oxyhydroxide  1,000 mg Oral TID WC   [MAR Hold] sucroferric oxyhydroxide  500 mg Oral With snacks   Continuous Infusions:  sodium chloride 10 mL/hr at 08/15/22 1025   [MAR Hold] cefTRIAXone (ROCEPHIN)  IV 2 g (08/14/22 2121)   [MAR Hold] vancomycin       LOS: 2 days   Hughie Closs, MD [facility]  08/15/2022, 11:47 AM   *Please note that this is a verbal dictation therefore any spelling or grammatical errors are due to the "[employer]agon [employer] One" system interpretation.  Please page via Amion and do not message via secure chat for urgent patient care matters. Secure chat can be used for non urgent patient care matters.  How to contact the [facility] Attending or Consulting provider 7A - 7P or covering provider during after hours 7P -7A, for this patient?  Check the care team in [facility] and look for a) attending/consulting [facility] provider listed and b) the [facility] team listed. Page or secure chat 7A-7P. Log into [email or web address] and use 's universal password to access. If you do not have the password, please contact the hospital operator. Locate the [facility] provider you are looking for under [facility] and page to a number that you can be directly reached. If you still have difficulty reaching the provider, please page the [facility] (Director on Call)  for the Hospitalists listed on amion for assistance.

## 2022-08-16 ENCOUNTER — Encounter (HOSPITAL_COMMUNITY): Payer: Self-pay | Admitting: Podiatry

## 2022-08-16 DIAGNOSIS — M86172 Other acute osteomyelitis, left ankle and foot: Secondary | ICD-10-CM | POA: Diagnosis not present

## 2022-08-16 LAB — CBC WITH DIFFERENTIAL/PLATELET
Abs Immature Granulocytes: 0.05 10*3/uL (ref 0.00–0.07)
Basophils Absolute: 0 10*3/uL (ref 0.0–0.1)
Basophils Relative: 0 %
Eosinophils Absolute: 0 10*3/uL (ref 0.0–0.5)
Eosinophils Relative: 0 %
HCT: 26.3 % — ABNORMAL LOW (ref 36.0–46.0)
Hemoglobin: 8.1 g/dL — ABNORMAL LOW (ref 12.0–15.0)
Immature Granulocytes: 1 %
Lymphocytes Relative: 15 %
Lymphs Abs: 1.5 10*3/uL (ref 0.7–4.0)
MCH: 24.6 pg — ABNORMAL LOW (ref 26.0–34.0)
MCHC: 30.8 g/dL (ref 30.0–36.0)
MCV: 79.9 fL — ABNORMAL LOW (ref 80.0–100.0)
Monocytes Absolute: 0.7 10*3/uL (ref 0.1–1.0)
Monocytes Relative: 7 %
Neutro Abs: 7.9 10*3/uL — ABNORMAL HIGH (ref 1.7–7.7)
Neutrophils Relative %: 77 %
Platelets: 259 10*3/uL (ref 150–400)
RBC: 3.29 MIL/uL — ABNORMAL LOW (ref 3.87–5.11)
RDW: 17.5 % — ABNORMAL HIGH (ref 11.5–15.5)
WBC: 10.2 10*3/uL (ref 4.0–10.5)
nRBC: 0.5 % — ABNORMAL HIGH (ref 0.0–0.2)

## 2022-08-16 LAB — GLUCOSE, CAPILLARY
Glucose-Capillary: 325 mg/dL — ABNORMAL HIGH (ref 70–99)
Glucose-Capillary: 239 mg/dL — ABNORMAL HIGH (ref 70–99)
Glucose-Capillary: 173 mg/dL — ABNORMAL HIGH (ref 70–99)
Glucose-Capillary: 268 mg/dL — ABNORMAL HIGH (ref 70–99)
Glucose-Capillary: 198 mg/dL — ABNORMAL HIGH (ref 70–99)

## 2022-08-16 LAB — CULTURE, BLOOD (ROUTINE X 2)

## 2022-08-16 MED ORDER — POLYETHYLENE GLYCOL 3350 17 G PO PACK
17.0000 g | PACK | Freq: Every day | ORAL | Status: DC | PRN
  Administered 2022-08-18 – 2022-08-20 (×2): 17 g via ORAL
  Filled 2022-08-16 (×2): qty 1

## 2022-08-16 MED ORDER — OXYCODONE HCL 5 MG PO TABS
5.0000 mg | ORAL_TABLET | Freq: Four times a day (QID) | ORAL | Status: DC | PRN
  Administered 2022-08-16 – 2022-08-18 (×5): 5 mg via ORAL
  Filled 2022-08-16 (×5): qty 1

## 2022-08-16 MED ORDER — INSULIN GLARGINE-YFGN 100 UNIT/ML ~~LOC~~ SOLN
35.0000 [IU] | Freq: Every day | SUBCUTANEOUS | Status: DC
  Administered 2022-08-16 – 2022-08-20 (×5): 35 [IU] via SUBCUTANEOUS
  Filled 2022-08-16 (×5): qty 0.35

## 2022-08-16 NOTE — Progress Notes (Signed)
PROGRESS NOTE    [name]  ZOX:[id number] DOB: [birth year] DOA: 08/13/2022 PCP: [name], MD   Brief Narrative:  Kathryn Beck is a 67 y.o. female with medical history significant of ESRD on HD, MWF,, HTN, DM2, CVA, recent RLE BKA performed by Dr. Lajoyce Corners 05/29/22, critical left lower extremity ischemia with great toe and second toe ulceration with PVD s/p s/p angiogram with lithotripsy, left superficial femoral artery and popliteal artery Dr Myra Gianotti on aspirin Plavix and Zetia as unable to take statin due to allergy, was seen by podiatry in the office due to nonhealing left foot ulcer and was sent to the hospital for potential surgical intervention which is scheduled on Saturday.  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  Antibiotics discontinued.  PT consulted to see tomorrow.  Further management per podiatry.    ESRD on HD: On MWF schedule.  Nephrology on board.  Next dialysis Monday.  Chronic pain syndrome: Continue home Lyrica and Percocet.  Essential hypertension: Controlled.  Continue metoprolol.  Type 2 diabetes mellitus: Currently on 20 units of Semglee and SSI as well as 3 units of NovoLog Premeal 3 times daily.  Blood sugar significantly elevated, will increase Semglee to 35 units.   DVT prophylaxis: heparin injection 5,000 Units Start: 08/13/22 2200   Code Status: Full Code  Family Communication: None present at bedside.  Plan of care discussed with patient in length and he/she verbalized understanding and agreed with it.  Status is: Inpatient Remains inpatient appropriate because: Needs to be seen by PT  OT, still has wound VAC attached.   Estimated body mass index is 30.5 kg/m as calculated from the following:   Height as of this encounter: 5\' 4"  (1.626 m).   Weight as of this encounter: 80.6 kg.    Nutritional Assessment: Body mass index is 30.5 kg/m.Marland Kitchen Seen by dietician.  I agree with the assessment and plan as outlined below: Nutrition Status: Nutrition Problem: Inadequate oral intake Etiology: nausea Signs/Symptoms: per patient/family report Interventions: Refer to RD note for recommendations, Education, MVI, Juven, Nepro shake, Liberalize Diet  . Skin Assessment: I have examined the patient's skin and I agree with the wound assessment as performed by the wound care RN as outlined below:    Consultants:  Podiatry  Procedures:  As above  Antimicrobials:  Anti-infectives (From admission, onward)    Start     Dose/Rate Route Frequency Ordered Stop   08/15/22 2200  cephALEXin (KEFLEX) capsule 500 mg        500 mg Oral Every 12 hours 08/15/22 1354 08/22/22 2159   08/15/22 1800  vancomycin (VANCOCIN) IVPB 1000 mg/200 mL premix  Status:  Discontinued        1,000 mg 200 mL/hr over 60 Minutes Intravenous Every T-Th-Sa (Hemodialysis) 08/14/22 1030 08/14/22 1037   08/14/22 1800  vancomycin (VANCOCIN) IVPB 1000 mg/200 mL premix  Status:  Discontinued        1,000 mg 200 mL/hr over 60 Minutes Intravenous Every M-W-F (Hemodialysis) 08/14/22 1037 08/15/22 1354   08/14/22 1200  vancomycin (VANCOCIN) IVPB 1000 mg/200 mL premix  Status:  Discontinued        1,000 mg 200 mL/hr over 60 Minutes Intravenous Every M-W-F (Hemodialysis) 08/13/22 1950 08/14/22 1030   08/13/22 2200  metroNIDAZOLE (FLAGYL) tablet 500 mg  Status:  Discontinued        500 mg Oral Every 12 hours 08/13/22 1948 08/14/22 1004   08/13/22 2000  vancomycin (VANCOREADY) IVPB 1750 mg/350 mL        1,750 mg 175 mL/hr over 120 Minutes Intravenous  Once 08/13/22 1949 08/13/22 2256   08/13/22 1952  cefTRIAXone (ROCEPHIN)  2 g in sodium chloride 0.9 % 100 mL IVPB  Status:  Discontinued        2 g 200 mL/hr over 30 Minutes Intravenous Every 24 hours 08/13/22 1948 08/15/22 1354   08/13/22 1915  ceFEPIme (MAXIPIME) 2 g in sodium chloride 0.9 % 100 mL IVPB  Status:  Discontinued        2 g 200 mL/hr over 30 Minutes Intravenous  Once 08/13/22 1909 08/13/22 1952         Subjective: Patient seen and remained.  Multiple family members at bedside.  Patient feeling well, denies any pain.  Objective: Vitals:   08/15/22 1900 08/16/22 0300 08/16/22 0800 08/16/22 1115  BP: (!) 163/77 132/60 (!) 114/32 (!) 133/56  Pulse: 73 75 67 77  Resp: 18 18 15 16   Temp: 98.2 F (36.8 C) 98 F (36.7 C)  98.4 F (36.9 C)  TempSrc: Oral Oral  Oral  SpO2: 100% 92% 100% 100%  Weight:      Height:        Intake/Output Summary (Last 24 hours) at 08/16/2022 1124 Last data filed at 08/15/2022 2100 Gross per 24 hour  Intake 940 ml  Output 0 ml  Net 940 ml    Filed Weights   08/15/22 0330 08/15/22 0538 08/15/22 1016  Weight: 82.8 kg 80.6 kg 80.6 kg    Examination:  General exam: Appears calm and comfortable  Respiratory system: Clear to auscultation. Respiratory effort normal. Cardiovascular system: S1 & S2 heard, RRR. No JVD, murmurs, rubs, gallops or clicks. No pedal edema. Gastrointestinal system: Abdomen is nondistended, soft and nontender. No organomegaly or masses felt. Normal bowel sounds heard. Central nervous system: Alert and oriented. No focal neurological deficits. Extremities: Wound VAC attached to the left foot. Psychiatry: Judgement and insight appear normal. Mood & affect appropriate.   Data Reviewed: I have personally reviewed following labs and imaging studies  CBC: Recent Labs  Lab 08/13/22 1452 08/14/22 0841 08/16/22 0145  WBC 9.3 8.7 10.2  NEUTROABS 6.3  --  7.9*  HGB 9.2* 8.7* 8.1*  HCT 30.9* 29.5* 26.3*  MCV 81.3 80.8 79.9*  PLT 315 301 259    Basic Metabolic Panel: Recent Labs   Lab 08/13/22 1452 08/14/22 0857 08/15/22 0612  NA 140 141 137  K 4.0 4.2 3.6  CL 101 101 97*  CO2 26 25 28   GLUCOSE 265* 140* 101*  BUN 22 31* 13  CREATININE 5.20* 6.27* 3.62*  CALCIUM 9.1 9.1 9.0  PHOS  --  4.9* 2.5    GFR: Estimated Creatinine Clearance: 15.7 mL/min (A) (by C-G formula based on SCr of 3.62 mg/dL (H)). Liver Function Tests: Recent Labs  Lab 08/13/22 1452 08/14/22 0857 08/15/22 0612  AST 27  --   --   ALT 28  --   --   ALKPHOS 112  --   --   BILITOT 0.4  --   --   PROT 7.6  --   --  ALBUMIN 3.1* 3.0* 2.7*    No results for input(s): "LIPASE", "AMYLASE" in the last 168 hours. No results for input(s): "AMMONIA" in the last 168 hours. Coagulation Profile: No results for input(s): "INR", "PROTIME" in the last 168 hours. Cardiac Enzymes: No results for input(s): "CKTOTAL", "CKMB", "CKMBINDEX", "TROPONINI" in the last 168 hours. BNP (last 3 results) No results for input(s): "PROBNP" in the last 8760 hours. HbA1C: No results for input(s): "HGBA1C" in the last 72 hours. CBG: Recent Labs  Lab 08/15/22 1550 08/15/22 2109 08/16/22 0608 08/16/22 0857 08/16/22 1111  GLUCAP 200* 199* 325* 239* 173*    Lipid Profile: No results for input(s): "CHOL", "HDL", "LDLCALC", "TRIG", "CHOLHDL", "LDLDIRECT" in the last 72 hours. Thyroid Function Tests: No results for input(s): "TSH", "T4TOTAL", "FREET4", "T3FREE", "THYROIDAB" in the last 72 hours. Anemia Panel: No results for input(s): "VITAMINB12", "FOLATE", "FERRITIN", "TIBC", "IRON", "RETICCTPCT" in the last 72 hours. Sepsis Labs: Recent Labs  Lab 08/13/22 1452 08/14/22 1122  LATICACIDVEN 2.3* 1.5     Recent Results (from the past 240 hour(s))  Blood culture (routine x 2)     Status: None (Preliminary result)   Collection Time: 08/13/22  7:45 PM   Specimen: BLOOD  Result Value Ref Range Status   Specimen Description BLOOD SITE NOT SPECIFIED  Final   Special Requests   Final    BOTTLES DRAWN  AEROBIC AND ANAEROBIC Blood Culture adequate volume   Culture   Final    NO GROWTH 2 DAYS Performed at [facility], [address], [place], [place] [address]    Report Status PENDING  Incomplete  Blood culture (routine x 2)     Status: None (Preliminary result)   Collection Time: 08/13/22  7:53 PM   Specimen: BLOOD RIGHT HAND  Result Value Ref Range Status   Specimen Description BLOOD RIGHT HAND  Final   Special Requests   Final    BOTTLES DRAWN AEROBIC AND ANAEROBIC Blood Culture adequate volume   Culture   Final    NO GROWTH 3 DAYS Performed at [facility], [address] [place], [place] [address]    Report Status PENDING  Incomplete  MRSA Next Gen by PCR, Nasal     Status: None   Collection Time: 08/13/22  8:02 PM   Specimen: Nasal Mucosa; Nasal Swab  Result Value Ref Range Status   MRSA by PCR Next Gen NOT DETECTED NOT DETECTED Final    Comment: (NOTE) The GeneXpert MRSA Assay (FDA approved for NASAL specimens only), is one component of a comprehensive MRSA colonization surveillance program. It is not intended to diagnose MRSA infection nor to guide or monitor treatment for MRSA infections. Test performance is not FDA approved in patients less than 70 years old. Performed at [facility], [address], [place], [place] [address]      Radiology Studies: No results found.  Scheduled Meds:  aspirin  162 mg Oral Daily   calcitRIOL  0.25 mcg Oral BID WC   cephALEXin  500 mg Oral Q12H   darbepoetin (ARANESP) injection - DIALYSIS  150 mcg Subcutaneous Q Sat-1800   DULoxetine  60 mg Oral Daily   ezetimibe  10 mg Oral Daily   feeding supplement (NEPRO CARB STEADY)  237 mL Oral Q24H   furosemide  80 mg Oral BID   heparin  5,000 Units Subcutaneous Q8H   insulin aspart  0-9 Units Subcutaneous TID WC   insulin aspart  3 Units Subcutaneous  TID WC   insulin glargine-yfgn  35 Units Subcutaneous Daily   ketotifen  1 drop Both Eyes BID   metoprolol  succinate  50 mg Oral Daily   nutrition supplement (JUVEN)  1 packet Oral BID BM   pregabalin  50 mg Oral TID   sertraline  50 mg Oral Daily   sucroferric oxyhydroxide  1,000 mg Oral TID WC   sucroferric oxyhydroxide  500 mg Oral With snacks   Continuous Infusions:     LOS: 3 days   Hughie Closs, MD [facility]  08/16/2022, 11:24 AM   *Please note that this is a verbal dictation therefore any spelling or grammatical errors are due to the "Dragon Medical One" system interpretation.  Please page via Amion and do not message via secure chat for urgent patient care matters. Secure chat can be used for non urgent patient care matters.  How to contact the [facility] Attending or Consulting provider 7A - 7P or covering provider during after hours 7P -7A, for this patient?  Check the care team in [facility] and look for a) attending/consulting TR[facility] provider listed and b) the [facility] team listed. Page or secure chat 7A-7P. Log into [email or web address] and use [place]'s universal password to access. If you do not have the password, please contact the hospital operator. Locate the [facility] provider you are looking for under [facility] and page to a number that you can be directly reached. If you still have difficulty reaching the provider, please page the [facility] (Director on Call) for the Hospitalists listed on amion for assistance.

## 2022-08-16 NOTE — Progress Notes (Signed)
Patient ID: Kathryn Beck, female   DOB: 1955-04-17, 67 y.o.   MRN: [id number] [facility] Progress Note   Assessment/ Plan:   1.  Nonhealing left lower extremity foot/toe ulcers: Status post efforts at revascularization and outpatient antibiotic as an outpatient with poor response.  Now POD #1 status post TMA with antibiotic switched to oral cephalexin. 2.  End-stage renal disease: Usually on a MWF dialysis schedule, will order for dialysis again tomorrow 3.  Hypertension: Blood pressures marginally elevated likely associated with pain, continue to monitor on oral antihypertensive therapy and ultrafiltration with hemodialysis. 4.  Anemia of chronic disease: Hemoglobin and hematocrit are marginal with anemia likely exacerbated by ESA resistance in the setting of nonhealing wounds.  Darbepoetin given yesterday with some postoperative hemoglobin drop noted as anticipated. 5.  Secondary hyperparathyroidism: Calcium and phosphorus levels both at goal, continue [name] for phosphorus binding as well as calcitriol for PTH control.  Subjective:   Underwent left transmetatarsal amputation yesterday with uneventful postoperative course overnight.   Objective:   BP (!) 114/32 (BP Location: Right Arm)   Pulse 67   Temp 98 F (36.7 C) (Oral)   Resp 15   Ht 5\' 4"  (1.626 m)   Wt 80.6 kg   SpO2 100%   BMI 30.50 kg/m   Physical Exam: Gen: Appears comfortable resting in bed, left TMA site in wound VAC. CVS: Pulse regular rhythm, normal rate, S1 and S2 normal Resp: Clear to auscultation bilaterally, no distinct rales or rhonchi Abd: Soft, flat, nontender, bowel sounds normal Ext: Left upper arm fistula with intact dressings.  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  Labs: BMET Recent Labs  Lab 08/13/22 1452 08/14/22 0857 08/15/22 0612  NA 140 141 137  K 4.0 4.2 3.6  CL 101 101 97*  CO2 26 25 28   GLUCOSE 265* 140* 101*  BUN 22 31* 13  CREATININE 5.20* 6.27*  3.62*  CALCIUM 9.1 9.1 9.0  PHOS  --  4.9* 2.5   CBC Recent Labs  Lab 08/13/22 1452 08/14/22 0841 08/16/22 0145  WBC 9.3 8.7 10.2  NEUTROABS 6.3  --  7.9*  HGB 9.2* 8.7* 8.1*  HCT 30.9* 29.5* 26.3*  MCV 81.3 80.8 79.9*  PLT 315 301 259      Medications:     aspirin  162 mg Oral Daily   aspirin  81 mg Oral Daily   calcitRIOL  0.25 mcg Oral BID WC   cephALEXin  500 mg Oral Q12H   darbepoetin (ARANESP) injection - DIALYSIS  150 mcg Subcutaneous Q Sat-1800   DULoxetine  60 mg Oral Daily   ezetimibe  10 mg Oral Daily   feeding supplement (NEPRO CARB STEADY)  237 mL Oral Q24H   furosemide  80 mg Oral BID   heparin  5,000 Units Subcutaneous Q8H   insulin aspart  0-9 Units Subcutaneous TID WC   insulin aspart  3 Units Subcutaneous TID WC   insulin glargine-yfgn  35 Units Subcutaneous Daily   ketotifen  1 drop Both Eyes BID   metoprolol succinate  50 mg Oral Daily   nutrition supplement (JUVEN)  1 packet Oral BID BM   pregabalin  50 mg Oral TID   sertraline  50 mg Oral Daily   sucroferric oxyhydroxide  1,000 mg Oral TID WC   sucroferric oxyhydroxide  500 mg Oral With snacks    Zetta Bills, MD 08/16/2022, 8:50 AM

## 2022-08-16 NOTE — Progress Notes (Signed)
  Subjective:  Patient ID: Kathryn Beck, female    DOB: [birth year],  MRN: [id number]  POD #1 left transmetatarsal amputation, Achilles tendon lengthening  Overall doing okay.  She is having some pain she says that oxycodone tends to control her pain better than Percocet.    Negative for chest pain and shortness of breath Fever: no Night sweats: no Constitutional signs: no Review of all other systems is negative Objective:   Vitals:   08/16/22 0800 08/16/22 1115  BP: (!) 114/32 (!) 133/56  Pulse: 67 77  Resp: 15 16  Temp:  98.4 F (36.9 C)  SpO2: 100% 100%   General AA&O x3. Normal mood and affect.  Vascular Lower limb warm   Neurologic Epicritic sensation grossly reduced.  Dermatologic VAC on and operating  Orthopedic: MMT 5/5 in dorsiflexion, plantarflexion, inversion, and eversion. Normal joint ROM without pain or crepitus.    Assessment & Plan:  Patient was evaluated and treated and all questions answered.  POD 1 left TMA and TAL -No further surgical plans -PT is ordered and will evaluate tomorrow -NWB to LLE primarily, I discussed with her she may utilize the CAM boot for transferring to and from bed/chair/wheelchair -CAM boot may remain off while she is in bed resting -Will continue to follow  [name], DPM  Accessible via secure chat for questions or concerns.

## 2022-08-16 NOTE — Progress Notes (Signed)
Orthopedic Tech Progress Note Patient Details:  Kathryn Beck 06-20-1955 [id number]  Ortho Devices Type of Ortho Device: CAM walker Ortho Device/Splint Interventions: Ordered  Delivered to room    Trinna Post 08/16/2022, 6:33 AM

## 2022-08-17 DIAGNOSIS — M86172 Other acute osteomyelitis, left ankle and foot: Secondary | ICD-10-CM | POA: Diagnosis not present

## 2022-08-17 LAB — RENAL FUNCTION PANEL
Albumin: 2.9 g/dL — ABNORMAL LOW (ref 3.5–5.0)
Anion gap: 17 — ABNORMAL HIGH (ref 5–15)
BUN: 43 mg/dL — ABNORMAL HIGH (ref 8–23)
CO2: 25 mmol/L (ref 22–32)
Calcium: 8.9 mg/dL (ref 8.9–10.3)
Chloride: 103 mmol/L (ref 98–111)
Creatinine, Ser: 8.02 mg/dL — ABNORMAL HIGH (ref 0.44–1.00)
GFR, Estimated: 5 mL/min — ABNORMAL LOW (ref >60)
Glucose, Bld: 121 mg/dL — ABNORMAL HIGH (ref 70–99)
Phosphorus: 5.8 mg/dL — ABNORMAL HIGH (ref 2.5–4.6)
Potassium: 4.8 mmol/L (ref 3.5–5.1)
Sodium: 145 mmol/L (ref 135–145)

## 2022-08-17 LAB — CBC WITH DIFFERENTIAL/PLATELET
Abs Immature Granulocytes: 0.1 10*3/uL — ABNORMAL HIGH (ref 0.00–0.07)
Basophils Absolute: 0.1 10*3/uL (ref 0.0–0.1)
Basophils Relative: 1 %
Eosinophils Absolute: 0.2 10*3/uL (ref 0.0–0.5)
Eosinophils Relative: 2 %
HCT: 27.3 % — ABNORMAL LOW (ref 36.0–46.0)
Hemoglobin: 8 g/dL — ABNORMAL LOW (ref 12.0–15.0)
Immature Granulocytes: 1 %
Lymphocytes Relative: 20 %
Lymphs Abs: 2.3 10*3/uL (ref 0.7–4.0)
MCH: 24.5 pg — ABNORMAL LOW (ref 26.0–34.0)
MCHC: 29.3 g/dL — ABNORMAL LOW (ref 30.0–36.0)
MCV: 83.5 fL (ref 80.0–100.0)
Monocytes Absolute: 1.4 10*3/uL — ABNORMAL HIGH (ref 0.1–1.0)
Monocytes Relative: 12 %
Neutro Abs: 7.9 10*3/uL — ABNORMAL HIGH (ref 1.7–7.7)
Neutrophils Relative %: 64 %
Platelets: 253 10*3/uL (ref 150–400)
RBC: 3.27 MIL/uL — ABNORMAL LOW (ref 3.87–5.11)
RDW: 17.6 % — ABNORMAL HIGH (ref 11.5–15.5)
WBC: 11.9 10*3/uL — ABNORMAL HIGH (ref 4.0–10.5)
nRBC: 0.3 % — ABNORMAL HIGH (ref 0.0–0.2)

## 2022-08-17 LAB — GLUCOSE, CAPILLARY
Glucose-Capillary: 117 mg/dL — ABNORMAL HIGH (ref 70–99)
Glucose-Capillary: 120 mg/dL — ABNORMAL HIGH (ref 70–99)
Glucose-Capillary: 176 mg/dL — ABNORMAL HIGH (ref 70–99)

## 2022-08-17 LAB — CBC
HCT: 26.9 % — ABNORMAL LOW (ref 36.0–46.0)
Hemoglobin: 7.9 g/dL — ABNORMAL LOW (ref 12.0–15.0)
MCH: 24 pg — ABNORMAL LOW (ref 26.0–34.0)
MCHC: 29.4 g/dL — ABNORMAL LOW (ref 30.0–36.0)
MCV: 81.8 fL (ref 80.0–100.0)
Platelets: 267 10*3/uL (ref 150–400)
RBC: 3.29 MIL/uL — ABNORMAL LOW (ref 3.87–5.11)
RDW: 17.6 % — ABNORMAL HIGH (ref 11.5–15.5)
WBC: 11.9 10*3/uL — ABNORMAL HIGH (ref 4.0–10.5)
nRBC: 0.2 % (ref 0.0–0.2)

## 2022-08-17 LAB — CULTURE, BLOOD (ROUTINE X 2)
Culture: NO GROWTH
Special Requests: ADEQUATE

## 2022-08-17 NOTE — Evaluation (Signed)
Physical Therapy Evaluation Patient Details Name: Kathryn Beck MRN: [id number] DOB: March 16, 1956 Today's Date: 08/17/2022  History of Present Illness  67 y.o.female comes to ED 5/23 from podiatrist Dr Gabriel Rung office for surgical treatment of gangrene and osteomyelitis of L foot. 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 PMH: ESRD on HD, HTN, DM2, CVA, recent R LE BKA  Clinical Impression  Pt supine in bed, lethargic and slow to answer questions. Reports that she lives with her husband in multistory home with 2 steps to enter and bed and bath on first floor. Pt reports minor assist for transfers to wheelchair and independence in bathing and dressing. Requires assist for wheelchair propulsion and iADLs. Pt refuses any mobilization today. Pt noted to have good L LE ROM as she was moving LE while PT asking about home set up. When asked for volitional movement pt with increased difficult with minimal tremulous movement in B LE. PT will follow back for more complete evaluation tomorrow however pt will likely need assist of 2 to get from bed to recliner given R BKA and NWB through L LE with CAM boot in place.        Recommendations for follow up therapy are one component of a multi-disciplinary discharge planning process, led by the attending physician.  Recommendations may be updated based on patient status, additional functional criteria and insurance authorization.  Follow Up Recommendations Can patient physically be transported by private vehicle: No     Assistance Recommended at Discharge Frequent or constant Supervision/Assistance  Patient can return home with the following  A lot of help with walking and/or transfers;A lot of help with bathing/dressing/bathroom;Assistance with cooking/housework;Direct supervision/assist for medications management;Direct supervision/assist for financial management;Assist for transportation;Help with stairs or ramp for  entrance    Equipment Recommendations None recommended by PT  Recommendations for Other Services  OT consult    Functional Status Assessment Patient has had a recent decline in their functional status and demonstrates the ability to make significant improvements in function in a reasonable and predictable amount of time.     Precautions / Restrictions Precautions Precautions: Fall Precaution Comments: Wound vac L LE Required Braces or Orthoses: Other Brace Other Brace: CAM Boot for L LE Restrictions Weight Bearing Restrictions: Yes RLE Weight Bearing: Non weight bearing (BKA) LLE Weight Bearing: Non weight bearing Other Position/Activity Restrictions: CAM Boot with out of bed          Pertinent Vitals/Pain Pain Assessment Pain Assessment: 0-10 Pain Score: 8  Pain Location: platar surface of L foot Pain Descriptors / Indicators: Burning, Throbbing, Grimacing, Guarding, Discomfort Pain Intervention(s): Premedicated before session, Monitored during session, Limited activity within patient's tolerance, Repositioned    Home Living Family/patient expects to be discharged to:: Private residence Living Arrangements: Spouse/significant other Available Help at Discharge: Family;Available 24 hours/day Type of Home: House Home Access: Stairs to enter Entrance Stairs-Rails: None Entrance Stairs-Number of Steps: 2   Home Layout: Able to live on main level with bedroom/bathroom Home Equipment: Agricultural consultant (2 wheels);Cane - single point;BSC/3in1;Wheelchair - manual;Grab bars - tub/shower      Prior Function Prior Level of Function : Needs assist             Mobility Comments: transfers with assistance to wheelchair for mobility ADLs Comments: independent with bathing and dressing, husband and family assist with iADLs     Hand Dominance   Dominant Hand: Right    Extremity/Trunk Assessment   Upper Extremity Assessment Upper  Extremity Assessment: Overall WFL for tasks  assessed    Lower Extremity Assessment Lower Extremity Assessment: RLE deficits/detail;LLE deficits/detail RLE Deficits / Details: R BKA, tremulous movement of   hip and knee, strength grossly 2+/5 RLE Coordination: decreased fine motor LLE Deficits / Details: L transmet amputation, wound vac in place, hip and knee ROM during session WFL while performing interview however  when asked to perform pt reports she is unable LLE Coordination: decreased fine motor       Communication   Communication: HOH  Cognition Arousal/Alertness: Lethargic, Suspect due to medications Behavior During Therapy: Flat affect Overall Cognitive Status: Difficult to assess                                 General Comments: pt with very slowed response to questions        General Comments General comments (skin integrity, edema, etc.): VSS on RA, wound vac in place and dressing clean dry and intact        Assessment/Plan    PT Assessment Patient needs continued PT services  PT Problem List Decreased strength;Decreased range of motion;Decreased activity tolerance;Decreased balance;Decreased mobility;Decreased coordination;Decreased cognition;Decreased knowledge of use of DME;Decreased safety awareness;Cardiopulmonary status limiting activity;Impaired sensation;Decreased skin integrity;Pain       PT Treatment Interventions DME instruction;Gait training;Functional mobility training;Stair training;Therapeutic activities;Therapeutic exercise;Balance training;Cognitive remediation;Patient/family education;Wheelchair mobility training    PT Goals (Current goals can be found in the Care Plan section)  Acute Rehab PT Goals Patient Stated Goal: have less pain PT Goal Formulation: With patient Time For Goal Achievement: 08/31/22 Potential to Achieve Goals: Fair    Frequency Min 1X/week        AM-PAC PT "6 Clicks" Mobility  Outcome Measure Help needed turning from your back to your side while  in a flat bed without using bedrails?: Total Help needed moving from lying on your back to sitting on the side of a flat bed without using bedrails?: Total Help needed moving to and from a bed to a chair (including a wheelchair)?: Total Help needed standing up from a chair using your arms (e.g., wheelchair or bedside chair)?: Total Help needed to walk in hospital room?: Total Help needed climbing 3-5 steps with a railing? : Total 6 Click Score: 6    End of Session   Activity Tolerance: Patient limited by pain Patient left: in bed;with call bell/phone within reach;with family/visitor present Nurse Communication: Mobility status;Patient requests pain meds PT Visit Diagnosis: Other abnormalities of gait and mobility (R26.89);Muscle weakness (generalized) (M62.81);Difficulty in walking, not elsewhere classified (R26.2);Pain Pain - Right/Left: Left Pain - part of body: Ankle and joints of foot    Time: 1720-1740 PT Time Calculation (min) (ACUTE ONLY): 20 min   Charges:   PT Evaluation $PT Eval Moderate Complexity: 1 Mod          Kathryn Beck B. Beverely Risen PT, DPT Acute Rehabilitation Services Please use secure chat or  Call Office [phone]   Elon Alas [facility] 08/17/2022, 6:11 PM

## 2022-08-17 NOTE — Progress Notes (Signed)
Received patient in bed to unit.  Alert and oriented.  Informed consent signed and in chart.   TX duration: 3.5hrs  Patient tolerated well.  Alert, without acute distress.  Hand-off given to patient's nurse.   Access used: L AVF Access issues: None  Total UF removed: Medication(s) given: None  Post HD weight: 83.8kg   08/17/22 1217  Vitals  Temp 98.5 F (36.9 C)  Temp Source Oral  BP (!) 153/61  MAP (mmHg) 86  BP Location Right Arm  BP Method Automatic  Patient Position (if appropriate) Lying  Pulse Rate 73  Pulse Rate Source Monitor  ECG Heart Rate 74  Resp 16  Oxygen Therapy  SpO2 100 %  O2 Device Nasal Cannula  O2 Flow Rate (L/min) 2 L/min  Patient Activity (if Appropriate) In bed  Pulse Oximetry Type Continuous  During Treatment Monitoring  Intra-Hemodialysis Comments Tolerated well  Post Treatment  Dialyzer Clearance Heavily streaked  Duration of HD Treatment -hour(s) 3.5 hour(s)  Liters Processed 84  Fluid Removed (mL) 2000 mL  Tolerated HD Treatment Yes  Post-Hemodialysis Comments Goal Met.  AVG/AVF Arterial Site Held (minutes) 7 minutes  AVG/AVF Venous Site Held (minutes) 7 minutes  Fistula / Graft Left Upper arm Arteriovenous fistula  Placement Date/Time: 09/02/17 1150   Placed prior to admission: Yes  Orientation: Left  Access Location: Upper arm  Access Type: Arteriovenous fistula  Site Condition No complications  Fistula / Graft Assessment Present;Thrill;Bruit  Status Deaccessed     [facility]

## 2022-08-17 NOTE — Progress Notes (Signed)
Report given to dialysis RN

## 2022-08-17 NOTE — Progress Notes (Signed)
  Subjective:  Patient ID: Kathryn Beck, female    DOB: 1956/01/05,  MRN: [id number]  POD #2 left transmetatarsal amputation, Achilles tendon lengthening  Overall doing okay.  Pain improving if she elevates. Has not had PT yet d/t HD this AM    Negative for chest pain and shortness of breath Fever: no Night sweats: no Constitutional signs: no Review of all other systems is negative Objective:   Vitals:   08/17/22 1217 08/17/22 1252  BP: (!) 153/61 (!) 143/56  Pulse: 73 81  Resp: 16 16  Temp: 98.5 F (36.9 C) 98.5 F (36.9 C)  SpO2: 100% 94%   General AA&O x3. Normal mood and affect.  Vascular Lower limb warm   Neurologic Epicritic sensation grossly reduced.  Dermatologic VAC on and operating  Orthopedic: MMT 5/5 in dorsiflexion, plantarflexion, inversion, and eversion. Normal joint ROM without pain or crepitus.    Assessment & Plan:  Patient was evaluated and treated and all questions answered.  POD 1 left TMA and TAL -No further surgical plans -PT to see today/tomorrow -TOC working on SNF/SAR -NWB to LLE primarily, I discussed with her she may utilize the CAM boot for transferring to and from bed/chair/wheelchair -CAM boot may remain off while she is in bed resting -Will continue to follow -VAC will be removed Wed or Thurs if she is still admitted prior to D/C  [name], DPM  Accessible via secure chat for questions or concerns.

## 2022-08-17 NOTE — Progress Notes (Signed)
PROGRESS NOTE    Kathryn Beck  ZOX:[id number] DOB: [birth year] DOA: 08/13/2022 PCP: [name], MD   Brief Narrative:  Kathryn Beck is a 67 y.o. female with medical history significant of ESRD on HD, MWF,, HTN, DM2, CVA, recent RLE BKA performed by Dr. Lajoyce Corners 05/29/22, critical left lower extremity ischemia with great toe and second toe ulceration with PVD s/p s/p angiogram with lithotripsy, left superficial femoral artery and popliteal artery Dr Myra Gianotti on aspirin Plavix and Zetia as unable to take statin due to allergy, was seen by podiatry in the office due to nonhealing left foot ulcer and was sent to the hospital for potential surgical intervention which is scheduled on Saturday.  Assessment & Plan:   Principal Problem:   Acute osteomyelitis of toe of left foot (HCC) Active Problems:   ESRD on hemodialysis (HCC)   Type 2 diabetes mellitus with hyperglycemia (HCC)   HTN (hypertension)   Chronic pain syndrome   Equinus contracture of left ankle  Acute osteomyelitis/wet gangrene of the toe of the left foot: Seen by podiatry, s/p left TMA 08/15/2022, has wound VAC.  Antibiotics discontinued.  PT to see today.  No further surgical plans per podiatry.  ESRD on HD: On MWF schedule.  Nephrology on board.  Getting dialysis today.  Chronic pain syndrome: Continue home Lyrica and Percocet.  Essential hypertension: Controlled.  Continue metoprolol.  Type 2 diabetes mellitus: Currently on 235 units of Semglee and SSI as well as 3 units of NovoLog Premeal 3 times daily.  Blood sugar much better controlled.  DVT prophylaxis: heparin injection 5,000 Units Start: 08/13/22 2200   Code Status: Full Code  Family Communication: None present at bedside.  Plan of care discussed with patient in length and he/she verbalized understanding and agreed with it.  Status is: Inpatient Remains inpatient appropriate because: Needs to be seen by PT OT.   Estimated body mass index is 32.51 kg/m  as calculated from the following:   Height as of this encounter: 5\' 4"  (1.626 m).   Weight as of this encounter: 85.9 kg.    Nutritional Assessment: Body mass index is 32.51 kg/m.Marland Kitchen Seen by dietician.  I agree with the assessment and plan as outlined below: Nutrition Status: Nutrition Problem: Inadequate oral intake Etiology: nausea Signs/Symptoms: per patient/family report Interventions: Refer to RD note for recommendations, Education, MVI, Juven, Nepro shake, Liberalize Diet  . Skin Assessment: I have examined the patient's skin and I agree with the wound assessment as performed by the wound care RN as outlined below:    Consultants:  Podiatry  Procedures:  As above  Antimicrobials:  Anti-infectives (From admission, onward)    Start     Dose/Rate Route Frequency Ordered Stop   08/15/22 2200  cephALEXin (KEFLEX) capsule 500 mg        500 mg Oral Every 12 hours 08/15/22 1354 08/22/22 2159   08/15/22 1800  vancomycin (VANCOCIN) IVPB 1000 mg/200 mL premix  Status:  Discontinued        1,000 mg 200 mL/hr over 60 Minutes Intravenous Every T-Th-Sa (Hemodialysis) 08/14/22 1030 08/14/22 1037   08/14/22 1800  vancomycin (VANCOCIN) IVPB 1000 mg/200 mL premix  Status:  Discontinued        1,000 mg 200 mL/hr over 60 Minutes Intravenous Every M-W-F (Hemodialysis) 08/14/22 1037 08/15/22 1354   08/14/22 1200  vancomycin (VANCOCIN) IVPB 1000 mg/200 mL premix  Status:  Discontinued        1,000 mg 200 mL/hr over  60 Minutes Intravenous Every M-W-F (Hemodialysis) 08/13/22 1950 08/14/22 1030   08/13/22 2200  metroNIDAZOLE (FLAGYL) tablet 500 mg  Status:  Discontinued        500 mg Oral Every 12 hours 08/13/22 1948 08/14/22 1004   08/13/22 2000  vancomycin (VANCOREADY) IVPB 1750 mg/350 mL        1,750 mg 175 mL/hr over 120 Minutes Intravenous  Once 08/13/22 1949 08/13/22 2256   08/13/22 1952  cefTRIAXone (ROCEPHIN) 2 g in sodium chloride 0.9 % 100 mL IVPB  Status:  Discontinued        2  g 200 mL/hr over 30 Minutes Intravenous Every 24 hours 08/13/22 1948 08/15/22 1354   08/13/22 1915  ceFEPIme (MAXIPIME) 2 g in sodium chloride 0.9 % 100 mL IVPB  Status:  Discontinued        2 g 200 mL/hr over 30 Minutes Intravenous  Once 08/13/22 1909 08/13/22 1952         Subjective: Patient seen and examined in dialysis unit.  No complaints.  Pain is very well-controlled.  Objective: Vitals:   08/17/22 1032 08/17/22 1100 08/17/22 1132 08/17/22 1202  BP: (!) 144/58 (!) 148/47 (!) 162/59 (!) 161/50  Pulse: 71 72 71 72  Resp: 15 15 14 10   Temp:      TempSrc:      SpO2: 99% 97% 100% 100%  Weight:      Height:       No intake or output data in the 24 hours ending 08/17/22 1210  Filed Weights   08/15/22 1016 08/17/22 0606 08/17/22 0801  Weight: 80.6 kg 84.1 kg 85.9 kg    Examination:  General exam: Appears calm and comfortable  Respiratory system: Clear to auscultation. Respiratory effort normal. Cardiovascular system: S1 & S2 heard, RRR. No JVD, murmurs, rubs, gallops or clicks.  Gastrointestinal system: Abdomen is nondistended, soft and nontender. No organomegaly or masses felt. Normal bowel sounds heard. Central nervous system: Alert and oriented. No focal neurological deficits. Extremities: Wound VAC attached to the left foot.  Right BKA. Psychiatry: Judgement and insight appear normal. Mood & affect appropriate.   Data Reviewed: I have personally reviewed following labs and imaging studies  CBC: Recent Labs  Lab 08/13/22 1452 08/14/22 0841 08/16/22 0145 08/17/22 0153 08/17/22 0737  WBC 9.3 8.7 10.2 11.9* 11.9*  NEUTROABS 6.3  --  7.9* 7.9*  --   HGB 9.2* 8.7* 8.1* 8.0* 7.9*  HCT 30.9* 29.5* 26.3* 27.3* 26.9*  MCV 81.3 80.8 79.9* 83.5 81.8  PLT 315 301 259 253 267    Basic Metabolic Panel: Recent Labs  Lab 08/13/22 1452 08/14/22 0857 08/15/22 0612 08/17/22 0737  NA 140 141 137 145  K 4.0 4.2 3.6 4.8  CL 101 101 97* 103  CO2 26 25 28 25   GLUCOSE  265* 140* 101* 121*  BUN 22 31* 13 43*  CREATININE 5.20* 6.27* 3.62* 8.02*  CALCIUM 9.1 9.1 9.0 8.9  PHOS  --  4.9* 2.5 5.8*    GFR: Estimated Creatinine Clearance: 7.3 mL/min (A) (by C-G formula based on SCr of 8.02 mg/dL (H)). Liver Function Tests: Recent Labs  Lab 08/13/22 1452 08/14/22 0857 08/15/22 0612 08/17/22 0737  AST 27  --   --   --   ALT 28  --   --   --   ALKPHOS 112  --   --   --   BILITOT 0.4  --   --   --   PROT 7.6  --   --   --  ALBUMIN 3.1* 3.0* 2.7* 2.9*    No results for input(s): "LIPASE", "AMYLASE" in the last 168 hours. No results for input(s): "AMMONIA" in the last 168 hours. Coagulation Profile: No results for input(s): "INR", "PROTIME" in the last 168 hours. Cardiac Enzymes: No results for input(s): "CKTOTAL", "CKMB", "CKMBINDEX", "TROPONINI" in the last 168 hours. BNP (last 3 results) No results for input(s): "PROBNP" in the last 8760 hours. HbA1C: No results for input(s): "HGBA1C" in the last 72 hours. CBG: Recent Labs  Lab 08/16/22 0857 08/16/22 1111 08/16/22 1617 08/16/22 2155 08/17/22 0622  GLUCAP 239* 173* 268* 198* 117*    Lipid Profile: No results for input(s): "CHOL", "HDL", "LDLCALC", "TRIG", "CHOLHDL", "LDLDIRECT" in the last 72 hours. Thyroid Function Tests: No results for input(s): "TSH", "T4TOTAL", "FREET4", "T3FREE", "THYROIDAB" in the last 72 hours. Anemia Panel: No results for input(s): "VITAMINB12", "FOLATE", "FERRITIN", "TIBC", "IRON", "RETICCTPCT" in the last 72 hours. Sepsis Labs: Recent Labs  Lab 08/13/22 1452 08/14/22 1122  LATICACIDVEN 2.3* 1.5     Recent Results (from the past 240 hour(s))  Blood culture (routine x 2)     Status: None (Preliminary result)   Collection Time: 08/13/22  7:45 PM   Specimen: BLOOD  Result Value Ref Range Status   Specimen Description BLOOD SITE NOT SPECIFIED  Final   Special Requests   Final    BOTTLES DRAWN AEROBIC AND ANAEROBIC Blood Culture adequate volume   Culture    Final    NO GROWTH 3 DAYS Performed at [facility], [address], [place], [place] [address]    Report Status PENDING  Incomplete  Blood culture (routine x 2)     Status: None (Preliminary result)   Collection Time: 08/13/22  7:53 PM   Specimen: BLOOD RIGHT HAND  Result Value Ref Range Status   Specimen Description BLOOD RIGHT HAND  Final   Special Requests   Final    BOTTLES DRAWN AEROBIC AND ANAEROBIC Blood Culture adequate volume   Culture   Final    NO GROWTH 4 DAYS Performed at [facility], [address], [place], [place] [address]    Report Status PENDING  Incomplete  MRSA Next Gen by PCR, Nasal     Status: None   Collection Time: 08/13/22  8:02 PM   Specimen: Nasal Mucosa; Nasal Swab  Result Value Ref Range Status   MRSA by PCR Next Gen NOT DETECTED NOT DETECTED Final    Comment: (NOTE) The GeneXpert MRSA Assay (FDA approved for NASAL specimens only), is one component of a comprehensive MRSA colonization surveillance program. It is not intended to diagnose MRSA infection nor to guide or monitor treatment for MRSA infections. Test performance is not FDA approved in patients less than 72 years old. Performed at [facility], [address], [place], [place] [address]      Radiology Studies: No results found.  Scheduled Meds:  aspirin  162 mg Oral Daily   calcitRIOL  0.25 mcg Oral BID WC   cephALEXin  500 mg Oral Q12H   darbepoetin (ARANESP) injection - DIALYSIS  150 mcg Subcutaneous Q Sat-1800   DULoxetine  60 mg Oral Daily   ezetimibe  10 mg Oral Daily   feeding supplement (NEPRO CARB STEADY)  237 mL Oral Q24H   furosemide  80 mg Oral BID   heparin  5,000 Units Subcutaneous Q8H   insulin aspart  0-9 Units Subcutaneous TID WC   insulin aspart  3 Units  Subcutaneous TID WC   insulin glargine-yfgn  35 Units Subcutaneous Daily   ketotifen  1 drop Both Eyes BID   metoprolol succinate  50 mg Oral Daily   nutrition supplement (JUVEN)  1 packet  Oral BID BM   pregabalin  50 mg Oral TID   sertraline  50 mg Oral Daily   sucroferric oxyhydroxide  1,000 mg Oral TID WC   sucroferric oxyhydroxide  500 mg Oral With snacks   Continuous Infusions:     LOS: 4 days   Hughie Closs, MD [facility]  08/17/2022, 12:10 PM   *Please note that this is a verbal dictation therefore any spelling or grammatical errors are due to the "Dragon Medical One" system interpretation.  Please page via Amion and do not message via secure chat for urgent patient care matters. Secure chat can be used for non urgent patient care matters.  How to contact the [facility] Attending or Consulting provider 7A - 7P or covering provider during after hours 7P -7A, for this patient?  Check the care team in [facility] and look for a) attending/consulting TRH provider listed and b) the [facility] team listed. Page or secure chat 7A-7P. Log into [email or web address] and use [facility] universal password to access. If you do not have the password, please contact the hospital operator. Locate the [facility] provider you are looking for under [facility] and page to a number that you can be directly reached. If you still have difficulty reaching the provider, please page the [facility] (Director on Call) for the Hospitalists listed on amion for assistance.

## 2022-08-17 NOTE — Progress Notes (Signed)
PT Cancellation Note  Patient Details Name: Kathryn Beck MRN: [id number] DOB: 1956-02-08   Cancelled Treatment:    Reason Eval/Treat Not Completed: (P) Patient at procedure or test/unavailable Pt is off the floor for HD. PT will follow back for Evaluation this afternoon as able.  Zykee Avakian B. Beverely Risen PT, DPT Acute Rehabilitation Services Please use secure chat or  Call Office [phone]   Elon Alas Fleet 08/17/2022, 8:20 AM

## 2022-08-17 NOTE — TOC Initial Note (Signed)
Transition of Care [facility]) - Initial/Assessment Note    Patient Details  Name: Kathryn Beck MRN: [id number] Date of Birth: 01/07/1956  Transition [facility]) CM/SW Contact:    Elliot Cousin, RN Phone Number: [phone] 08/17/2022, 12:06 PM  Clinical Narrative:  CM spoke to pt's dtr, Lewayne. States pt lives in home with spouse. Dtr is requesting pt be evaluated for IP rehab. Educated PT/OT plans to work with patient and they will make recommendations. CM will send message to PT scheduled today about IP rehab. She is agreeable also to SNF rehab, first choice is [facility] and then [facility]. Pt has been to both facilities in the past. Gave permission to create FL2 and fax referral for SNF rehab. Plan is IP rehab vs SNF rehab. Waiting PT/OT recommendations.                   Expected Discharge Plan: IP Rehab Facility Barriers to Discharge: Continued Medical Work up   Patient Goals and CMS Choice Patient states their goals for this hospitalization and ongoing recovery are:: wants mother to recover CMS Medicare.gov Compare Post Acute Care list provided to:: Patient Represenative (must comment) (daughter-Lewayne Julien Girt) Choice offered to / list presented to : Adult Children [place] ownership interest in [facility].provided to:: Adult Children    Expected Discharge Plan and Services In-house Referral: Clinical Social Work Discharge Planning Services: CM Consult Post Acute Care Choice: IP Rehab Living arrangements for the past 2 months: Single Family Home                                      Prior Living Arrangements/Services Living arrangements for the past 2 months: Single Family Home Lives with:: Spouse Patient language and need for interpreter reviewed:: Yes Do you feel safe going back to the place where you live?: No   daughter wants patient to have rehab prior to dc home  Need for Family Participation in Patient Care: Yes (Comment) Care  giver support system in place?: Yes (comment) Current home services: DME (hoyer lift, wheelchair, adjustable bed, bedside commode, sliding board) Criminal Activity/Legal Involvement Pertinent to Current Situation/Hospitalization: No - Comment as needed  Activities of Daily Living      Permission Sought/Granted Permission sought to share information with : Case Manager, PCP, Family Supports Permission granted to share information with : Yes, Verbal Permission Granted  Share Information with NAME: Tomma Lightning  Permission granted to share info w AGENCY: SNF  Permission granted to share info w Relationship: daughter  Permission granted to share info w Contact Information: [phone]  Emotional Assessment           Psych Involvement: No (comment)  Admission diagnosis:  Acute osteomyelitis (HCC) [M86.10] Acute osteomyelitis of toe of left foot (HCC) [M86.172] Patient Active Problem List   Diagnosis Date Noted   Equinus contracture of left ankle 08/15/2022   Acute osteomyelitis of toe of left foot (HCC) 08/13/2022   Peripheral vascular complication 07/21/2022   PVD (peripheral vascular disease) (HCC) 07/18/2022   Toe ulcer, left, limited to breakdown of skin (HCC) 07/13/2022   S/P BKA (below knee amputation), right (HCC) 05/29/2022   Confusion 05/19/2022   Visual hallucinations 05/19/2022   Watery eyes 05/19/2022   Requires assistance with activities of daily living (ADL) 04/26/2022   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   Gangrene of  right foot (HCC) 04/25/2022   PAD (peripheral artery disease) (HCC) 04/25/2022   Other osteoporosis without current pathological fracture 11/10/2021   Hypocalcemia 11/10/2021   Fall 11/02/2021   Left foot pain 10/31/2021   Low back pain with left-sided sciatica 10/31/2021   Left ear impacted cerumen 09/18/2021   Chronic left shoulder pain 06/10/2021   Abnormal CXR 03/10/2021   Ear pain, bilateral 03/10/2021   Morbid obesity  (HCC) 03/10/2021   Subungual hematoma of second toe of left foot 10/28/2020   Insomnia 10/28/2020   Memory loss or impairment 09/26/2020   Forgetfulness 09/26/2020   Recurrent vertigo 09/26/2020   Abnormal MRI, cervical spine 03/04/2020   Right leg weakness 02/28/2020   Knee pain, right 10/23/2019   Cough 08/09/2019   Carpal tunnel syndrome 06/13/2019   Chronic pain syndrome 06/13/2019   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) 06/13/2019   Neck pain 03/23/2019   Near syncope 12/05/2018   ESRD on hemodialysis (HCC) 11/22/2018   Ischemic heart disease 09/25/2018   Not currently working due to disabled status 06/13/2018   HTN (hypertension) 06/12/2018   Depression 03/09/2018   Adrenal mass, left (HCC) 11/09/2017   MGUS (monoclonal gammopathy of unknown significance) 11/05/2017   Shoulder pain 11/01/2017   Chronic diastolic (congestive) heart failure (HCC) 09/20/2017   Bilateral leg weakness 09/09/2017   Adrenal adenoma 09/03/2017   Uncontrolled type 2 diabetes mellitus 08/18/2017   Unsteady gait 07/11/2017   Recurrent falls 07/11/2017   Anemia of chronic disease 03/24/2017   Personal history of colonic polyps 02/10/2017   Dysphagia 11/05/2016   Irritable bowel syndrome 02/04/2016   Hospital discharge follow-up 10/27/2015   Intolerant of heat 10/07/2014   Cardiac murmur 01/17/2014   Back pain with left-sided radiculopathy 09/18/2013   Seasonal allergies 03/10/2013   Lipoma of back 12/22/2012   Other seasonal allergic rhinitis 08/22/2012   OSA (obstructive sleep apnea) 06/02/2011   Chronic kidney disease, stage 4 (severe) (HCC) 01/13/2011   Neuropathy 04/16/2010   Malaise and fatigue 07/24/2009   Personal history of other diseases of the nervous system and sense organs 05/08/2009   LUPUS ERYTHEMATOSUS, DISCOID 06/05/2008   Arm pain 05/30/2008   Hyperlipidemia 06/07/2007   Obesity 06/07/2007   Malignant hypertension 06/07/2007   Type 2 diabetes mellitus  with hyperglycemia (HCC) 06/07/2007   Diabetes mellitus (HCC) 06/07/2007   PCP:  [name], MD Pharmacy:   [facility] - [place], [place] - [address] [place] [address] Phone: [phone] Fax: [phone]  [facility] - [place], [place] - [address] [place] [address] Phone: [phone] Fax: [phone]     Social Determinants of Health (SDOH) Social History: SDOH Screenings   Food Insecurity: No Food Insecurity (07/23/2022)  Housing: Low Risk  (07/18/2022)  Transportation Needs: No Transportation Needs (07/23/2022)  Utilities: Not At Risk (07/18/2022)  Alcohol Screen: Low Risk  (05/12/2022)  Depression (PHQ2-9): Low Risk  (07/07/2022)  Recent Concern: Depression (PHQ2-9) - High Risk (05/12/2022)  Financial Resource Strain: Medium Risk (05/12/2022)  Physical Activity: Inactive (05/12/2022)  Social Connections: Socially Integrated (05/12/2022)  Stress: No Stress Concern Present (05/12/2022)  Tobacco Use: Low Risk  (08/16/2022)   SDOH Interventions:     Readmission Risk Interventions    05/01/2022    3:54 PM  Readmission Risk Prevention Plan  Transportation Screening Complete  HRI or Home Care Consult Complete  Palliative Care Screening Not Applicable  Medication Review Oceanographer) Complete

## 2022-08-17 NOTE — Progress Notes (Signed)
Patient ID: Kathryn Beck, female   DOB: 1955/07/06, 67 y.o.   MRN: [id number] [facility] Progress Note   Assessment/ Plan:   1.  Nonhealing left lower extremity foot/toe ulcers: Status post efforts at revascularization and outpatient antibiotic as an outpatient with poor response. Status post TMA with antibiotic switched to oral cephalexin. 2.  End-stage renal disease: Usually on a MWF dialysis schedule. Continue on schedule. Dialysis today.  3.  Hypertension: Blood pressures marginally elevated likely associated with pain, continue to monitor on oral antihypertensive therapy and ultrafiltration with hemodialysis. 4.  Anemia of chronic disease: Hemoglobin and hematocrit are marginal with anemia likely exacerbated by ESA resistance in the setting of nonhealing wounds.  Darbepoetin 150 mcg given  5/25 with some postoperative hemoglobin drop noted as anticipated. 5.  Secondary hyperparathyroidism: Calcium and phosphorus levels acceptable, continue [name] for phosphorus binding as well as calcitriol for PTH control.  Subjective:   Seen in K[facility]. Tolerating UF this am. No complaints. Denies cp, dyspnea.    Objective:   BP (!) 155/56   Pulse 71   Temp 98.6 F (37 C) (Oral)   Resp 13   Ht 5\' 4"  (1.626 m)   Wt 85.9 kg   SpO2 100%   BMI 32.51 kg/m   Physical Exam: Gen: Appears comfortable resting in bed, left TMA site in wound VAC. CVS: Pulse regular rhythm, normal rate, S1 and S2 normal Resp: Clear to auscultation bilaterally, no distinct rales or rhonchi Abd: Soft, flat, nontender, bowel sounds normal Ext: Left upper arm fistula with intact dressings.  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  Labs: BMET Recent Labs  Lab 08/13/22 1452 08/14/22 0857 08/15/22 0612 08/17/22 0737  NA 140 141 137 145  K 4.0 4.2 3.6 4.8  CL 101 101 97* 103  CO2 26 25 28 25   GLUCOSE 265* 140* 101* 121*  BUN 22 31* 13 43*  CREATININE 5.20* 6.27* 3.62* 8.02*  CALCIUM 9.1  9.1 9.0 8.9  PHOS  --  4.9* 2.5 5.8*    CBC Recent Labs  Lab 08/13/22 1452 08/14/22 0841 08/16/22 0145 08/17/22 0153 08/17/22 0737  WBC 9.3 8.7 10.2 11.9* 11.9*  NEUTROABS 6.3  --  7.9* 7.9*  --   HGB 9.2* 8.7* 8.1* 8.0* 7.9*  HCT 30.9* 29.5* 26.3* 27.3* 26.9*  MCV 81.3 80.8 79.9* 83.5 81.8  PLT 315 301 259 253 267       Medications:     aspirin  162 mg Oral Daily   calcitRIOL  0.25 mcg Oral BID WC   cephALEXin  500 mg Oral Q12H   darbepoetin (ARANESP) injection - DIALYSIS  150 mcg Subcutaneous Q Sat-1800   DULoxetine  60 mg Oral Daily   ezetimibe  10 mg Oral Daily   feeding supplement (NEPRO CARB STEADY)  237 mL Oral Q24H   furosemide  80 mg Oral BID   heparin  5,000 Units Subcutaneous Q8H   insulin aspart  0-9 Units Subcutaneous TID WC   insulin aspart  3 Units Subcutaneous TID WC   insulin glargine-yfgn  35 Units Subcutaneous Daily   ketotifen  1 drop Both Eyes BID   metoprolol succinate  50 mg Oral Daily   nutrition supplement (JUVEN)  1 packet Oral BID BM   pregabalin  50 mg Oral TID   sertraline  50 mg Oral Daily   sucroferric oxyhydroxide  1,000 mg Oral TID WC   sucroferric oxyhydroxide  500 mg  Oral With snacks   Tomasa Blase PA-C  [facility] 08/17/2022,10:06 AM

## 2022-08-18 ENCOUNTER — Ambulatory Visit: Payer: Self-pay | Admitting: *Deleted

## 2022-08-18 ENCOUNTER — Encounter: Payer: Self-pay | Admitting: *Deleted

## 2022-08-18 DIAGNOSIS — M86172 Other acute osteomyelitis, left ankle and foot: Secondary | ICD-10-CM | POA: Diagnosis not present

## 2022-08-18 LAB — HEPATITIS B SURFACE ANTIGEN: Hepatitis B Surface Ag: NONREACTIVE

## 2022-08-18 LAB — CBC WITH DIFFERENTIAL/PLATELET
Abs Immature Granulocytes: 0.08 10*3/uL — ABNORMAL HIGH (ref 0.00–0.07)
Basophils Absolute: 0.1 10*3/uL (ref 0.0–0.1)
Basophils Relative: 1 %
Eosinophils Absolute: 0.2 10*3/uL (ref 0.0–0.5)
Eosinophils Relative: 2 %
HCT: 27.4 % — ABNORMAL LOW (ref 36.0–46.0)
Hemoglobin: 8.2 g/dL — ABNORMAL LOW (ref 12.0–15.0)
Immature Granulocytes: 1 %
Lymphocytes Relative: 18 %
Lymphs Abs: 2.1 10*3/uL (ref 0.7–4.0)
MCH: 24.1 pg — ABNORMAL LOW (ref 26.0–34.0)
MCHC: 29.9 g/dL — ABNORMAL LOW (ref 30.0–36.0)
MCV: 80.6 fL (ref 80.0–100.0)
Monocytes Absolute: 1.2 10*3/uL — ABNORMAL HIGH (ref 0.1–1.0)
Monocytes Relative: 10 %
Neutro Abs: 8 10*3/uL — ABNORMAL HIGH (ref 1.7–7.7)
Neutrophils Relative %: 68 %
Platelets: 274 10*3/uL (ref 150–400)
RBC: 3.4 MIL/uL — ABNORMAL LOW (ref 3.87–5.11)
RDW: 17.5 % — ABNORMAL HIGH (ref 11.5–15.5)
WBC: 11.6 10*3/uL — ABNORMAL HIGH (ref 4.0–10.5)
nRBC: 0.3 % — ABNORMAL HIGH (ref 0.0–0.2)

## 2022-08-18 LAB — VITAMIN E
Vitamin E (Alpha Tocopherol): 7.8 mg/L — ABNORMAL LOW (ref 9.0–29.0)
Vitamin E(Gamma Tocopherol): 2 mg/L (ref 0.5–4.9)

## 2022-08-18 LAB — GLUCOSE, CAPILLARY
Glucose-Capillary: 90 mg/dL (ref 70–99)
Glucose-Capillary: 163 mg/dL — ABNORMAL HIGH (ref 70–99)
Glucose-Capillary: 170 mg/dL — ABNORMAL HIGH (ref 70–99)
Glucose-Capillary: 79 mg/dL (ref 70–99)
Glucose-Capillary: 469 mg/dL — ABNORMAL HIGH (ref 70–99)
Glucose-Capillary: 202 mg/dL — ABNORMAL HIGH (ref 70–99)

## 2022-08-18 LAB — SURGICAL PATHOLOGY

## 2022-08-18 LAB — VITAMIN A: Vitamin A (Retinoic Acid): 28.7 ug/dL (ref 22.0–69.5)

## 2022-08-18 MED ORDER — LIDOCAINE-PRILOCAINE 2.5-2.5 % EX CREA: 1.0000 | TOPICAL_CREAM | CUTANEOUS | Status: DC | PRN

## 2022-08-18 MED ORDER — ALTEPLASE 2 MG IJ SOLR: 2.0000 mg | Freq: Once | INTRAMUSCULAR | Status: DC | PRN

## 2022-08-18 MED ORDER — CHLORHEXIDINE GLUCONATE CLOTH 2 % EX PADS
6.0000 | MEDICATED_PAD | Freq: Every day | CUTANEOUS | Status: DC
  Administered 2022-08-19 – 2022-08-20 (×2): 6 via TOPICAL

## 2022-08-18 MED ORDER — HEPARIN SODIUM (PORCINE) 1000 UNIT/ML DIALYSIS: 1000.0000 [IU] | INTRAMUSCULAR | Status: DC | PRN

## 2022-08-18 MED ORDER — OXYCODONE-ACETAMINOPHEN 5-325 MG PO TABS: 1.0000 | ORAL_TABLET | ORAL | 0 refills | Status: DC | PRN

## 2022-08-18 MED ORDER — LIDOCAINE HCL (PF) 1 % IJ SOLN: 5.0000 mL | INTRAMUSCULAR | Status: DC | PRN

## 2022-08-18 MED ORDER — INSULIN ASPART 100 UNIT/ML IJ SOLN
8.0000 [IU] | Freq: Once | INTRAMUSCULAR | Status: AC
  Administered 2022-08-18: 8 [IU] via SUBCUTANEOUS

## 2022-08-18 MED ORDER — PENTAFLUOROPROP-TETRAFLUOROETH EX AERO: 1.0000 | INHALATION_SPRAY | CUTANEOUS | Status: DC | PRN

## 2022-08-18 NOTE — TOC Progression Note (Signed)
Transition of Care [facility]) - Progression Note    Patient Details  Name: Kathryn Beck MRN: [id number] Date of Birth: 01-Dec-1955  Transition of Care [facility]) CM/SW Contact  Eduard Roux, [place] Phone Number: 08/18/2022, 3:49 PM  Clinical Narrative:     CSW received message from [facility] SW- patient and family is interested and is agreeable to SNF. CSW sent referrals to preferred SNFs as requested and surrounding areas.   CSW contacted [facility] to review the referral- waiting on decision  TOC will provide bed offers once available.  TOC will continue to follow and assist with discharge planning.  Antony Blackbird, MSW, LCSW Clinical Social Worker      Expected Discharge Plan: Skilled Nursing Facility Barriers to Discharge: Continued Medical Work up  Expected Discharge Plan and Services In-house Referral: Clinical Social Work Discharge Planning Services: CM Consult Post Acute Care Choice: IP Rehab Living arrangements for the past 2 months: Single Family Home                                       Social Determinants of Health (SDOH) Interventions SDOH Screenings   Food Insecurity: No Food Insecurity (07/23/2022)  Housing: Low Risk  (07/18/2022)  Transportation Needs: No Transportation Needs (07/23/2022)  Utilities: Not At Risk (07/18/2022)  Alcohol Screen: Low Risk  (05/12/2022)  Depression (PHQ2-9): Low Risk  (07/07/2022)  Recent Concern: Depression (PHQ2-9) - High Risk (05/12/2022)  Financial Resource Strain: Medium Risk (05/12/2022)  Physical Activity: Inactive (05/12/2022)  Social Connections: Socially Integrated (05/12/2022)  Stress: No Stress Concern Present (05/12/2022)  Tobacco Use: Low Risk  (08/18/2022)    Readmission Risk Interventions    05/01/2022    3:54 PM  Readmission Risk Prevention Plan  Transportation Screening Complete  HRI or Home Care Consult Complete  Palliative Care Screening Not Applicable  Medication Review (RN Care Manager) Complete

## 2022-08-18 NOTE — [place] FL2 (Signed)
[place] MEDICAID FL2 LEVEL OF CARE FORM     IDENTIFICATION  Patient Name: Kathryn Beck Birthdate: 1955/09/18 Sex: female Admission Date (Current Location): 08/13/2022  [facility] and [place] Number:  [employer] and Address:  [facility] [place], [address], [place], [place] [address]      Provider Number: [id number]  Attending Physician Name and Address:  Hughie Closs, MD  Relative Name and Phone Number:       Current Level of Care: Hospital Recommended Level of Care: Skilled Nursing Facility Prior Approval Number:    Date Approved/Denied:   PASRR Number: [id number]  Discharge Plan: SNF    Current Diagnoses: Patient Active Problem List   Diagnosis Date Noted   Equinus contracture of left ankle 08/15/2022   Acute osteomyelitis of toe of left foot (HCC) 08/13/2022   Peripheral vascular complication 07/21/2022   PVD (peripheral vascular disease) (HCC) 07/18/2022   Toe ulcer, left, limited to breakdown of skin (HCC) 07/13/2022   S/P BKA (below knee amputation), right (HCC) 05/29/2022   Confusion 05/19/2022   Visual hallucinations 05/19/2022   Watery eyes 05/19/2022   Requires assistance with activities of daily living (ADL) 04/26/2022   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   Gangrene of right foot (HCC) 04/25/2022   PAD (peripheral artery disease) (HCC) 04/25/2022   Other osteoporosis without current pathological fracture 11/10/2021   Hypocalcemia 11/10/2021   Fall 11/02/2021   Left foot pain 10/31/2021   Low back pain with left-sided sciatica 10/31/2021   Left ear impacted cerumen 09/18/2021   Chronic left shoulder pain 06/10/2021   Abnormal CXR 03/10/2021   Ear pain, bilateral 03/10/2021   Morbid obesity (HCC) 03/10/2021   Subungual hematoma of second toe of left foot 10/28/2020   Insomnia 10/28/2020   Memory loss or impairment 09/26/2020   Forgetfulness 09/26/2020   Recurrent vertigo 09/26/2020   Abnormal  MRI, cervical spine 03/04/2020   Right leg weakness 02/28/2020   Knee pain, right 10/23/2019   Cough 08/09/2019   Carpal tunnel syndrome 06/13/2019   Chronic pain syndrome 06/13/2019   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) 06/13/2019   Neck pain 03/23/2019   Near syncope 12/05/2018   ESRD on hemodialysis (HCC) 11/22/2018   Ischemic heart disease 09/25/2018   Not currently working due to disabled status 06/13/2018   HTN (hypertension) 06/12/2018   Depression 03/09/2018   Adrenal mass, left (HCC) 11/09/2017   MGUS (monoclonal gammopathy of unknown significance) 11/05/2017   Shoulder pain 11/01/2017   Chronic diastolic (congestive) heart failure (HCC) 09/20/2017   Bilateral leg weakness 09/09/2017   Adrenal adenoma 09/03/2017   Uncontrolled type 2 diabetes mellitus 08/18/2017   Unsteady gait 07/11/2017   Recurrent falls 07/11/2017   Anemia of chronic disease 03/24/2017   Personal history of colonic polyps 02/10/2017   Dysphagia 11/05/2016   Irritable bowel syndrome 02/04/2016   Hospital discharge follow-up 10/27/2015   Intolerant of heat 10/07/2014   Cardiac murmur 01/17/2014   Back pain with left-sided radiculopathy 09/18/2013   Seasonal allergies 03/10/2013   Lipoma of back 12/22/2012   Other seasonal allergic rhinitis 08/22/2012   OSA (obstructive sleep apnea) 06/02/2011   Chronic kidney disease, stage 4 (severe) (HCC) 01/13/2011   Neuropathy 04/16/2010   Malaise and fatigue 07/24/2009   Personal history of other diseases of the nervous system and sense organs 05/08/2009   LUPUS ERYTHEMATOSUS, DISCOID 06/05/2008   Arm pain 05/30/2008   Hyperlipidemia  06/07/2007   Obesity 06/07/2007   Malignant hypertension 06/07/2007   Type 2 diabetes mellitus with hyperglycemia (HCC) 06/07/2007   Diabetes mellitus (HCC) 06/07/2007    Orientation RESPIRATION BLADDER Height & Weight     Self, Time, Situation, Place  Normal Continent Weight: 184 lb 11.9 oz (83.8  kg) Height:  5\' 4"  (162.6 cm)  BEHAVIORAL SYMPTOMS/MOOD NEUROLOGICAL BOWEL NUTRITION STATUS      Continent Diet (please see discharge summary)  AMBULATORY STATUS COMMUNICATION OF NEEDS Skin   Limited Assist Verbally Surgical wounds (closed incision left leg, negative pressure wound left foof)                       Personal Care Assistance Level of Assistance  Bathing, Feeding, Dressing Bathing Assistance: Limited assistance Feeding assistance: Independent Dressing Assistance: Limited assistance     Functional Limitations Info  Sight, Hearing, Speech Sight Info: Adequate Hearing Info: Adequate Speech Info: Adequate    SPECIAL CARE FACTORS FREQUENCY  OT (By licensed OT), PT (By licensed PT)     PT Frequency: 5x per week OT Frequency: 5x per week            Contractures Contractures Info: Not present    Additional Factors Info  Code Status, Allergies Code Status Info: FULL Allergies Info: Ace Inhibitors,Penicillins,Statins,Albuterol           Current Medications (08/18/2022):  This is the current hospital active medication list Current Facility-Administered Medications  Medication Dose Route Frequency Provider Last Rate Last Admin   acetaminophen (TYLENOL) tablet 650 mg  650 mg Oral Q6H PRN [name], DPM   650 mg at 08/18/22 1428   Or   acetaminophen (TYLENOL) suppository 650 mg  650 mg Rectal Q6H PRN [name], DPM       alteplase (CATHFLO ACTIVASE) injection 2 mg  2 mg Intracatheter Once PRN Berenda Morale, NP       aspirin chewable tablet 162 mg  162 mg Oral Daily [name], DPM   162 mg at 08/18/22 0855   calcitRIOL (ROCALTROL) capsule 0.25 mcg  0.25 mcg Oral BID WC [name], DPM   0.25 mcg at 08/18/22 0855   cephALEXin (KEFLEX) capsule 500 mg  500 mg Oral Q12H Sharl Ma R, DPM   500 mg at 08/18/22 0855   [START ON 08/19/2022] Chlorhexidine Gluconate Cloth 2 % PADS 6 each  6 each Topical Q0600 Berenda Morale, NP        Darbepoetin Alfa (ARANESP) injection 150 mcg  150 mcg Subcutaneous Q Sat-1800 [name], DPM   150 mcg at 08/15/22 1816   DULoxetine (CYMBALTA) DR capsule 60 mg  60 mg Oral Daily McDonaldRachelle Hora, DPM   60 mg at 08/18/22 0855   ezetimibe (ZETIA) tablet 10 mg  10 mg Oral Daily [name], DPM   10 mg at 08/18/22 0854   feeding supplement (NEPRO CARB STEADY) liquid 237 mL  237 mL Oral Q24H [name], DPM   237 mL at 08/16/22 1818   furosemide (LASIX) tablet 80 mg  80 mg Oral BID [name], DPM   80 mg at 08/18/22 0855   heparin injection 1,000 Units  1,000 Units Dialysis PRN Berenda Morale, NP       heparin injection 5,000 Units  5,000 Units Subcutaneous Q8H [name], DPM   5,000 Units at 08/18/22 1416   hydrOXYzine (ATARAX) tablet 25 mg  25 mg Oral Q12H PRN [name], DPM       insulin aspart (novoLOG) injection 0-9 Units  0-9 Units Subcutaneous TID WC [name], DPM   2 Units at 08/18/22 1419   insulin aspart (novoLOG) injection 3 Units  3 Units Subcutaneous TID WC [name], DPM   3 Units at 08/18/22 1418   insulin glargine-yfgn (SEMGLEE) injection 35 Units  35 Units Subcutaneous Daily Hughie Closs, MD   35 Units at 08/18/22 0856   ketotifen (ZADITOR) 0.035 % ophthalmic solution 1 drop  1 drop Both Eyes BID [name], DPM   1 drop at 08/18/22 0857   lidocaine (PF) (XYLOCAINE) 1 % injection 5 mL  5 mL Intradermal PRN Berenda Morale, NP       lidocaine-prilocaine (EMLA) cream 1 Application  1 Application Topical PRN Berenda Morale, NP       metoprolol succinate (TOPROL-XL) 24 hr tablet 50 mg  50 mg Oral Daily [name], DPM   50 mg at 08/18/22 4098   nutrition supplement (JUVEN) (JUVEN) powder packet 1 packet  1 packet Oral BID BM [name], DPM   1 packet at 08/16/22 0833   ondansetron (ZOFRAN) tablet 4 mg  4 mg Oral Q6H PRN [name], DPM       Or   ondansetron (ZOFRAN) injection 4 mg  4 mg  Intravenous Q6H PRN [name], DPM       oxyCODONE (Oxy IR/ROXICODONE) immediate release tablet 5 mg  5 mg Oral Q6H PRN [name], DPM   5 mg at 08/18/22 1191   pentafluoroprop-tetrafluoroeth (GEBAUERS) aerosol 1 Application  1 Application Topical PRN Berenda Morale, NP       polyethylene glycol (MIRALAX / GLYCOLAX) packet 17 g  17 g Oral Daily PRN Hughie Closs, MD   17 g at 08/18/22 0856   pregabalin (LYRICA) capsule 50 mg  50 mg Oral TID [name], DPM   50 mg at 08/18/22 0855   sertraline (ZOLOFT) tablet 50 mg  50 mg Oral Daily [name], DPM   50 mg at 08/18/22 4782   sucroferric oxyhydroxide ([name]) chewable tablet 1,000 mg  1,000 mg Oral TID WC [name], DPM   1,000 mg at 08/18/22 1414   sucroferric oxyhydroxide ([name]) chewable tablet 500 mg  500 mg Oral With snacks [name], DPM   500 mg at 08/17/22 2022   Facility-Administered Medications Ordered in Other Encounters  Medication Dose Route Frequency Provider Last Rate Last Admin   0.9 %  sodium chloride infusion   Intravenous Continuous Lockamy, Randi L, NP-C 20 mL/hr at 05/29/22 0928 New Bag at 05/29/22 1013     Discharge Medications: Please see discharge summary for a list of discharge medications.  Relevant Imaging Results:  Relevant Lab Results:   Additional Information SSN [id number]  Eduard Roux, LCSW

## 2022-08-18 NOTE — Progress Notes (Signed)
PROGRESS NOTE    Kathryn Beck  XBM:[id number] DOB: April 28, 1955 DOA: 08/13/2022 PCP: [name], MD   Brief Narrative:  Kathryn Beck is a 67 y.o. female with medical history significant of ESRD on HD, MWF,, HTN, DM2, CVA, recent RLE BKA performed by Dr. Lajoyce Corners 05/29/22, critical left lower extremity ischemia with great toe and second toe ulceration with PVD s/p s/p angiogram with lithotripsy, left superficial femoral artery and popliteal artery Dr Myra Gianotti on aspirin Plavix and Zetia as unable to take statin due to allergy, was seen by podiatry in the office due to nonhealing left foot ulcer and was sent to the hospital for potential surgical intervention which is scheduled on Saturday.  Assessment & Plan:   Principal Problem:   Acute osteomyelitis of toe of left foot (HCC) Active Problems:   ESRD on hemodialysis (HCC)   Type 2 diabetes mellitus with hyperglycemia (HCC)   HTN (hypertension)   Chronic pain syndrome   Equinus contracture of left ankle  Acute osteomyelitis/wet gangrene of the toe of the left foot: Seen by podiatry, s/p left TMA 08/15/2022, has wound VAC.  Antibiotics IV discontinued and started on Keflex for 7 days by podiatry.  Seen by PT OT, they recommend SNF.  TOC consulted.  Patient medically stable.  ESRD on HD: On MWF schedule.  Nephrology on board.  Getting dialysis on a schedule.  Chronic pain syndrome: Continue home Lyrica and Percocet.  Essential hypertension: Controlled.  Continue metoprolol.  Type 2 diabetes mellitus: Currently on 35 units of Semglee and SSI as well as 3 units of NovoLog Premeal 3 times daily.  Blood sugar improved, will continue current management.  DVT prophylaxis: heparin injection 5,000 Units Start: 08/13/22 2200   Code Status: Full Code  Family Communication: None present at bedside.  Plan of care discussed with patient in length and he/she verbalized understanding and agreed with it.  Status is: Inpatient Remains inpatient  appropriate because: Medically stable.  Needs placement to SNF.   Estimated body mass index is 31.71 kg/m as calculated from the following:   Height as of this encounter: 5\' 4"  (1.626 m).   Weight as of this encounter: 83.8 kg.    Nutritional Assessment: Body mass index is 31.71 kg/m.Marland Kitchen Seen by dietician.  I agree with the assessment and plan as outlined below: Nutrition Status: Nutrition Problem: Inadequate oral intake Etiology: nausea Signs/Symptoms: per patient/family report Interventions: Refer to RD note for recommendations, Education, MVI, Juven, Nepro shake, Liberalize Diet  . Skin Assessment: I have examined the patient's skin and I agree with the wound assessment as performed by the wound care RN as outlined below:    Consultants:  Podiatry  Procedures:  As above  Antimicrobials:  Anti-infectives (From admission, onward)    Start     Dose/Rate Route Frequency Ordered Stop   08/15/22 2200  cephALEXin (KEFLEX) capsule 500 mg        500 mg Oral Every 12 hours 08/15/22 1354 08/22/22 2159   08/15/22 1800  vancomycin (VANCOCIN) IVPB 1000 mg/200 mL premix  Status:  Discontinued        1,000 mg 200 mL/hr over 60 Minutes Intravenous Every T-Th-Sa (Hemodialysis) 08/14/22 1030 08/14/22 1037   08/14/22 1800  vancomycin (VANCOCIN) IVPB 1000 mg/200 mL premix  Status:  Discontinued        1,000 mg 200 mL/hr over 60 Minutes Intravenous Every M-W-F (Hemodialysis) 08/14/22 1037 08/15/22 1354   08/14/22 1200  vancomycin (VANCOCIN) IVPB 1000 mg/200 mL  premix  Status:  Discontinued        1,000 mg 200 mL/hr over 60 Minutes Intravenous Every M-W-F (Hemodialysis) 08/13/22 1950 08/14/22 1030   08/13/22 2200  metroNIDAZOLE (FLAGYL) tablet 500 mg  Status:  Discontinued        500 mg Oral Every 12 hours 08/13/22 1948 08/14/22 1004   08/13/22 2000  vancomycin (VANCOREADY) IVPB 1750 mg/350 mL        1,750 mg 175 mL/hr over 120 Minutes Intravenous  Once 08/13/22 1949 08/13/22 2256    08/13/22 1952  cefTRIAXone (ROCEPHIN) 2 g in sodium chloride 0.9 % 100 mL IVPB  Status:  Discontinued        2 g 200 mL/hr over 30 Minutes Intravenous Every 24 hours 08/13/22 1948 08/15/22 1354   08/13/22 1915  ceFEPIme (MAXIPIME) 2 g in sodium chloride 0.9 % 100 mL IVPB  Status:  Discontinued        2 g 200 mL/hr over 30 Minutes Intravenous  Once 08/13/22 1909 08/13/22 1952         Subjective: Patient seen and examined.  Family member at the bedside.  Patient's pain is controlled and she has no other complaint.  She is agreeable to go to SNF.  Objective: Vitals:   08/17/22 1252 08/17/22 1700 08/17/22 1929 08/18/22 0737  BP: (!) 143/56 (!) 127/51 108/62 (!) 143/53  Pulse: 81 94 86 69  Resp: 16 17 19 11   Temp: 98.5 F (36.9 C) 98.3 F (36.8 C) 99.1 F (37.3 C) 98.4 F (36.9 C)  TempSrc: Oral Oral Oral Axillary  SpO2: 94% 94% 90% 96%  Weight:      Height:        Intake/Output Summary (Last 24 hours) at 08/18/2022 1008 Last data filed at 08/17/2022 1217 Gross per 24 hour  Intake --  Output 2000 ml  Net -2000 ml    Filed Weights   08/17/22 0606 08/17/22 0801 08/17/22 1214  Weight: 84.1 kg 85.9 kg 83.8 kg    Examination:  General exam: Appears calm and comfortable  Respiratory system: Clear to auscultation. Respiratory effort normal. Cardiovascular system: S1 & S2 heard, RRR. No JVD, murmurs, rubs, gallops or clicks.  Gastrointestinal system: Abdomen is nondistended, soft and nontender. No organomegaly or masses felt. Normal bowel sounds heard. Central nervous system: Alert and oriented. No focal neurological deficits. Extremities: Right BKA, wound VAC to the left foot. Psychiatry: Judgement and insight appear normal. Mood & affect appropriate.   Data Reviewed: I have personally reviewed following labs and imaging studies  CBC: Recent Labs  Lab 08/13/22 1452 08/14/22 0841 08/16/22 0145 08/17/22 0153 08/17/22 0737 08/18/22 0052  WBC 9.3 8.7 10.2 11.9* 11.9*  11.6*  NEUTROABS 6.3  --  7.9* 7.9*  --  8.0*  HGB 9.2* 8.7* 8.1* 8.0* 7.9* 8.2*  HCT 30.9* 29.5* 26.3* 27.3* 26.9* 27.4*  MCV 81.3 80.8 79.9* 83.5 81.8 80.6  PLT 315 301 259 253 267 274    Basic Metabolic Panel: Recent Labs  Lab 08/13/22 1452 08/14/22 0857 08/15/22 0612 08/17/22 0737  NA 140 141 137 145  K 4.0 4.2 3.6 4.8  CL 101 101 97* 103  CO2 26 25 28 25   GLUCOSE 265* 140* 101* 121*  BUN 22 31* 13 43*  CREATININE 5.20* 6.27* 3.62* 8.02*  CALCIUM 9.1 9.1 9.0 8.9  PHOS  --  4.9* 2.5 5.8*    GFR: Estimated Creatinine Clearance: 7.2 mL/min (A) (by C-G formula based on SCr of 8.02  mg/dL (H)). Liver Function Tests: Recent Labs  Lab 08/13/22 1452 08/14/22 0857 08/15/22 0612 08/17/22 0737  AST 27  --   --   --   ALT 28  --   --   --   ALKPHOS 112  --   --   --   BILITOT 0.4  --   --   --   PROT 7.6  --   --   --   ALBUMIN 3.1* 3.0* 2.7* 2.9*    No results for input(s): "LIPASE", "AMYLASE" in the last 168 hours. No results for input(s): "AMMONIA" in the last 168 hours. Coagulation Profile: No results for input(s): "INR", "PROTIME" in the last 168 hours. Cardiac Enzymes: No results for input(s): "CKTOTAL", "CKMB", "CKMBINDEX", "TROPONINI" in the last 168 hours. BNP (last 3 results) No results for input(s): "PROBNP" in the last 8760 hours. HbA1C: No results for input(s): "HGBA1C" in the last 72 hours. CBG: Recent Labs  Lab 08/17/22 0622 08/17/22 1254 08/17/22 1657 08/17/22 2107 08/18/22 0608  GLUCAP 117* 90 120* 176* 163*    Lipid Profile: No results for input(s): "CHOL", "HDL", "LDLCALC", "TRIG", "CHOLHDL", "LDLDIRECT" in the last 72 hours. Thyroid Function Tests: No results for input(s): "TSH", "T4TOTAL", "FREET4", "T3FREE", "THYROIDAB" in the last 72 hours. Anemia Panel: No results for input(s): "VITAMINB12", "FOLATE", "FERRITIN", "TIBC", "IRON", "RETICCTPCT" in the last 72 hours. Sepsis Labs: Recent Labs  Lab 08/13/22 1452 08/14/22 1122   LATICACIDVEN 2.3* 1.5     Recent Results (from the past 240 hour(s))  Blood culture (routine x 2)     Status: None (Preliminary result)   Collection Time: 08/13/22  7:45 PM   Specimen: BLOOD  Result Value Ref Range Status   Specimen Description BLOOD SITE NOT SPECIFIED  Final   Special Requests   Final    BOTTLES DRAWN AEROBIC AND ANAEROBIC Blood Culture adequate volume   Culture   Final    NO GROWTH 4 DAYS Performed at [facility], [address] [place], [place] [address]    Report Status PENDING  Incomplete  Blood culture (routine x 2)     Status: None   Collection Time: 08/13/22  7:53 PM   Specimen: BLOOD RIGHT HAND  Result Value Ref Range Status   Specimen Description BLOOD RIGHT HAND  Final   Special Requests   Final    BOTTLES DRAWN AEROBIC AND ANAEROBIC Blood Culture adequate volume   Culture   Final    NO GROWTH 5 DAYS Performed at [facility], [address] [place], [place] [address]    Report Status 08/18/2022 FINAL  Final  MRSA Next Gen by PCR, Nasal     Status: None   Collection Time: 08/13/22  8:02 PM   Specimen: Nasal Mucosa; Nasal Swab  Result Value Ref Range Status   MRSA by PCR Next Gen NOT DETECTED NOT DETECTED Final    Comment: (NOTE) The GeneXpert MRSA Assay (FDA approved for NASAL specimens only), is one component of a comprehensive MRSA colonization surveillance program. It is not intended to diagnose MRSA infection nor to guide or monitor treatment for MRSA infections. Test performance is not FDA approved in patients less than 83 years old. Performed at [facility], [address], [place], [place] [address]      Radiology Studies: No results found.  Scheduled Meds:  aspirin  162 mg Oral Daily   calcitRIOL  0.25 mcg Oral BID WC   cephALEXin  500  mg Oral Q12H   darbepoetin (ARANESP) injection - DIALYSIS  150 mcg Subcutaneous Q Sat-1800   DULoxetine  60 mg Oral Daily   ezetimibe  10 mg Oral Daily   feeding  supplement (NEPRO CARB STEADY)  237 mL Oral Q24H   furosemide  80 mg Oral BID   heparin  5,000 Units Subcutaneous Q8H   insulin aspart  0-9 Units Subcutaneous TID WC   insulin aspart  3 Units Subcutaneous TID WC   insulin glargine-yfgn  35 Units Subcutaneous Daily   ketotifen  1 drop Both Eyes BID   metoprolol succinate  50 mg Oral Daily   nutrition supplement (JUVEN)  1 packet Oral BID BM   pregabalin  50 mg Oral TID   sertraline  50 mg Oral Daily   sucroferric oxyhydroxide  1,000 mg Oral TID WC   sucroferric oxyhydroxide  500 mg Oral With snacks   Continuous Infusions:     LOS: 5 days   Hughie Closs, MD [facility]  08/18/2022, 10:08 AM   *Please note that this is a verbal dictation therefore any spelling or grammatical errors are due to the "Dragon Medical One" system interpretation.  Please page via Amion and do not message via secure chat for urgent patient care matters. Secure chat can be used for non urgent patient care matters.  How to contact the [facility] Attending or Consulting provider 7A - 7P or covering provider during after hours 7P -7A, for this patient?  Check the care team in [facility] and look for a) attending/consulting TR[facility] provider listed and b) the [facility] team listed. Page or secure chat 7A-7P. Log into [email or web address] and use [place] universal password to access. If you do not have the password, please contact the hospital operator. Locate the [facility] provider you are looking for under [facility] and page to a number that you can be directly reached. If you still have difficulty reaching the provider, please page the [facility] (Director on Call) for the Hospitalists listed on amion for assistance.

## 2022-08-18 NOTE — Patient Outreach (Signed)
Care Coordination   Follow Up Visit Note   08/18/2022  Name: Kathryn Beck MRN: [id number] DOB: 1956-02-24  Kathryn Beck is a 67 y.o. year old female who sees Kathryn Perches, MD for primary care. I spoke with Kathryn Beck and daughter, Kathryn Beck by phone today.  What matters to the patients health and wellness today?  Receive Assistance Applying for Aid & Attendance Benefits, through [employer].   Goals Addressed             This Visit's Progress    Receive Assistance Applying for Aid & Attendance Benefits, through [employer].   On track    Care Coordination Interventions:  Interventions Today    Flowsheet Row Most Recent Value  Chronic Disease   Chronic disease during today's visit Diabetes, Hypertension (HTN), Chronic Kidney Disease/End Stage Renal Disease (ESRD), Congestive Heart Failure (CHF), Other  [Neuropathy, Chronic Pain Syndrome, Anxiety, Depression, Memory Loss, Neurocognitive Disorder & Requires Maximum Assistance with Activities of Daily Living]  General Interventions   General Interventions Discussed/Reviewed General Interventions Reviewed, Annual Eye Exam, General Interventions Discussed, Labs, Annual Foot Exam, Lipid Profile, Durable Medical Equipment (DME), Vaccines, Health Screening, [employer], Doctor Visits, Communication with, Level of Care  [Primary Care Provider]  Labs Hgb A1c every 3 months, Kidney Function  Vaccines COVID-19, Flu, Pneumonia, RSV, Shingles, Tetanus/Pertussis/Diphtheria  [Encouraged]  Doctor Visits Discussed/Reviewed Doctor Visits Discussed, Doctor Visits Reviewed, Annual Wellness Visits, PCP, Specialist  [Encouraged]  Health Screening Colonoscopy, Mammogram  [Encouraged]  Durable Medical Equipment (DME) BP Cuff, Glucomoter, Environmental consultant, Wheelchair, Physicist, medical  PCP/Specialist Visits Compliance with follow-up visit  Communication with PCP/Specialists, RN  Level of Care  Adult Daycare, Air traffic controller, Assisted Living, Skilled Nursing Facility, Teaching laboratory technician Medicaid, Personal Care Services  Exercise Interventions   Exercise Discussed/Reviewed Exercise Discussed, Exercise Reviewed, Physical Activity, Assistive device use and maintanence  Physical Activity Discussed/Reviewed Physical Activity Discussed, Physical Activity Reviewed, Types of exercise, Home Exercise Program (HEP)  [Encouraged]  Education Interventions   Education Provided Provided Therapist, sports, Provided Web-based Education, Provided Education  Provided Verbal Education On Nutrition, Foot Care, Eye Care, Labs, Blood Sugar Monitoring, Mental Health/Coping with Illness, Applications, Exercise, Medication, When to see the doctor, [employer], Human resources officer, Personal Care Services  Mental Health Interventions   Mental Health Discussed/Reviewed Mental Health Discussed, Mental Health Reviewed, Coping Strategies, Crisis, Anxiety, Depression, Grief and Loss, Substance Abuse, Suicide  Nutrition Interventions   Nutrition Discussed/Reviewed Nutrition Discussed, Nutrition Reviewed, Adding fruits and vegetables, Fluid intake, Decreasing sugar intake, Increaing proteins, Decreasing fats, Decreasing salt  Pharmacy Interventions   Pharmacy Dicussed/Reviewed Pharmacy Topics Discussed, Pharmacy Topics Reviewed, Medication Adherence, Affording Medications, Referral to Pharmacist  Medication Adherence Unable to refill medication  Referral to Pharmacist Cannot afford medications  Safety Interventions   Safety Discussed/Reviewed Safety Discussed, Safety Reviewed, Fall Risk, Home Safety  Home Safety Assistive Devices, Need for home safety assessment, Refer for home visit, Contact provider for referral to PT/OT, Refer for community resources, Contact home health agency  Advanced Directive Interventions   Advanced Directives Discussed/Reviewed Advanced Directives  Discussed, Advanced Directives Reviewed, Advanced Care Planning, Provided resource for acquiring and filling out documents     Active Listening & Reflection Utilized.  Emotional Support Provided. Verbalization of Feelings Encouraged.  Feelings of Motivation & Hopefulness, with Regards to Regaining Independence, Acknowledged. Relief from Pain Symptoms Validated. Solution-Focused Strategies Implemented. Problem Solving Interventions Employed. Task-Centered Solutions Activated.  Cognitive Behavioral  Therapy Initiated.  Client-Centered Therapy Performed. Acceptance & Commitment Therapy Indicated. CSW Collaboration with Patient & Daughter, Kathryn Beck to Confirm Interest in Receiving Placement in Skilled Nursing Facility to [employer], Upon Medical Clearance & Discharge from [facility]. CSW Collaboration with Kathryn Beck, TOC/LCSW with [facility], Via Secure Chat Message in [place], to Confirm Interest in Placement at [facility] [phone]) or [facility] [phone]). CSW Collaboration with Patient & Daughter, Kathryn Beck to Confirm Continued Receipt of Precooked Frozen Meals, Delivered Directly to Home, through [facility] [employer] [phone]).   CSW Collaboration with Patient & Daughter, Kathryn Beck to Encourage Continued Use of [employer] [phone]). CSW Collaboration with Patient & Daughter, Kathryn Beck to Encourage Contact with [facility] [phone]), to Schedule Appointment to Receive Assistance with Review & Submission of Completed Application for Aid & Attendance Benefits. CSW Collaboration with Patient & Daughter, Kathryn Beck to [employer] with CSW [phone]# [phone]), if They Have Questions, Need Assistance, or If Additional Social Work Needs Are Identified Between Now &  Our Next Scheduled Follow-Up Outreach Call.      SDOH assessments and interventions completed:  Yes.  Care Coordination Interventions:  Yes, provided.   Follow up plan: Follow up call scheduled for 08/31/2022 at 9:45 am.  Encounter Outcome:  Pt. Visit Completed.   Danford Bad, BSW, MSW, LCSW  Licensed Restaurant manager, fast food [facility]  Mailing [place] [address] [place], [place] [address] Physical Address-[address], [place], [place] [address] Toll Free Main # [phone] Fax # [phone] Cell # [phone] [email or web address]

## 2022-08-18 NOTE — Progress Notes (Signed)
[facility] Progress Note   Subjective:    Seen examined patient at bedside. C/o left foot pain and has pain medication PRN ordered. Tolerated yesterday's HD with net UF 2L. Next HD 5/29.  Objective Vitals:   08/17/22 1700 08/17/22 1929 08/18/22 0737 08/18/22 1224  BP: (!) 127/51 108/62 (!) 143/53 (!) 134/51  Pulse: 94 86 69 93  Resp: 17 19 11 16   Temp: 98.3 F (36.8 C) 99.1 F (37.3 C) 98.4 F (36.9 C) 98.3 F (36.8 C)  TempSrc: Oral Oral Axillary Oral  SpO2: 94% 90% 96% 99%  Weight:      Height:       Physical Exam General: Awake, alert, on RA, NAD Heart: S1 and S2; No murmurs, gallops, or rubs Lungs: Clear bilaterally; No wheezes, rales, or rhonchi Abdomen: Soft and non-tender Extremities: No LE edema; L TMA site with wound vac in place Dialysis Access: L AVF (+) B/T   Filed Weights   08/17/22 0606 08/17/22 0801 08/17/22 1214  Weight: 84.1 kg 85.9 kg 83.8 kg   No intake or output data in the 24 hours ending 08/18/22 1238  Additional Objective Labs: Basic Metabolic Panel: Recent Labs  Lab 08/14/22 0857 08/15/22 0612 08/17/22 0737  NA 141 137 145  K 4.2 3.6 4.8  CL 101 97* 103  CO2 25 28 25   GLUCOSE 140* 101* 121*  BUN 31* 13 43*  CREATININE 6.27* 3.62* 8.02*  CALCIUM 9.1 9.0 8.9  PHOS 4.9* 2.5 5.8*   Liver Function Tests: Recent Labs  Lab 08/13/22 1452 08/14/22 0857 08/15/22 0612 08/17/22 0737  AST 27  --   --   --   ALT 28  --   --   --   ALKPHOS 112  --   --   --   BILITOT 0.4  --   --   --   PROT 7.6  --   --   --   ALBUMIN 3.1* 3.0* 2.7* 2.9*   No results for input(s): "LIPASE", "AMYLASE" in the last 168 hours. CBC: Recent Labs  Lab 08/14/22 0841 08/16/22 0145 08/17/22 0153 08/17/22 0737 08/18/22 0052  WBC 8.7 10.2 11.9* 11.9* 11.6*  NEUTROABS  --  7.9* 7.9*  --  8.0*  HGB 8.7* 8.1* 8.0* 7.9* 8.2*  HCT 29.5* 26.3* 27.3* 26.9* 27.4*  MCV 80.8 79.9* 83.5 81.8 80.6  PLT 301 259 253 267 274   Blood Culture     Component Value Date/Time   SDES BLOOD RIGHT HAND 08/13/2022 1953   SPECREQUEST  08/13/2022 1953    BOTTLES DRAWN AEROBIC AND ANAEROBIC Blood Culture adequate volume   CULT  08/13/2022 1953    NO GROWTH 5 DAYS Performed at [facility], [address] [place], [place] [address]    REPTSTATUS 08/18/2022 FINAL 08/13/2022 1953    Cardiac Enzymes: No results for input(s): "CKTOTAL", "CKMB", "CKMBINDEX", "TROPONINI" in the last 168 hours. CBG: Recent Labs  Lab 08/17/22 1254 08/17/22 1657 08/17/22 2107 08/18/22 0608 08/18/22 1138  GLUCAP 90 120* 176* 163* 170*   Iron Studies: No results for input(s): "IRON", "TIBC", "TRANSFERRIN", "FERRITIN" in the last 72 hours. Lab Results  Component Value Date   INR 1.0 03/03/2019   INR 1.17 09/02/2017   Studies/Results: No results found.  Medications:   aspirin  162 mg Oral Daily   calcitRIOL  0.25 mcg Oral BID WC   cephALEXin  500 mg Oral Q12H   darbepoetin (ARANESP) injection - DIALYSIS  150  mcg Subcutaneous Q Sat-1800   DULoxetine  60 mg Oral Daily   ezetimibe  10 mg Oral Daily   feeding supplement (NEPRO CARB STEADY)  237 mL Oral Q24H   furosemide  80 mg Oral BID   heparin  5,000 Units Subcutaneous Q8H   insulin aspart  0-9 Units Subcutaneous TID WC   insulin aspart  3 Units Subcutaneous TID WC   insulin glargine-yfgn  35 Units Subcutaneous Daily   ketotifen  1 drop Both Eyes BID   metoprolol succinate  50 mg Oral Daily   nutrition supplement (JUVEN)  1 packet Oral BID BM   pregabalin  50 mg Oral TID   sertraline  50 mg Oral Daily   sucroferric oxyhydroxide  1,000 mg Oral TID WC   sucroferric oxyhydroxide  500 mg Oral With snacks    Dialysis Orders: Per patient, she receives her outpatient hemodialysis from [facility].  Assessment/Plan:  1.  Nonhealing left lower extremity foot/toe ulcers: Status post efforts at revascularization and outpatient antibiotic as an outpatient with poor response. Status post TMA with  antibiotic switched to oral cephalexin. 2.  End-stage renal disease: Usually on a MWF dialysis schedule. Continue on schedule. Next HD 5/29.  3.  Hypertension: Blood pressures marginally elevated likely associated with pain, continue to monitor on oral antihypertensive therapy and ultrafiltration with hemodialysis. Bps appears more stable today.  4.  Anemia of chronic disease: Hemoglobin and hematocrit are marginal with anemia likely exacerbated by ESA resistance in the setting of nonhealing wounds.  Darbepoetin 150 mcg given  5/25 with some postoperative hemoglobin drop noted as anticipated. 5.  Secondary hyperparathyroidism: Calcium and phosphorus levels acceptable, continue [name] for phosphorus binding as well as calcitriol for PTH control.  Salome Holmes, NP [facility] 08/18/2022,12:38 PM  LOS: 5 days

## 2022-08-18 NOTE — Evaluation (Signed)
Occupational Therapy Evaluation Patient Details Name: Kathryn Beck MRN: [id number] DOB: Jan 26, 1956 Today's Date: 08/18/2022   History of Present Illness 67 y.o.female comes to ED 5/23 from podiatrist Dr Gabriel Rung office for surgical treatment of gangrene and osteomyelitis of L foot. 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 PMH: ESRD on HD, HTN, DM2, CVA, recent R LE BKA   Clinical Impression   Pt reports she is independent at baseline with ADLs and uses w/c for mobility. Pt lives with spouse and granddaughter who assist at home PRN with IADLs. Pt currently needing set up - mod A for ADLs, min A +2 for anterior/posterior transfer to/from chair with use of bed pad. Educated pt on weightbearing precautions and cam boot wear, pt verbalized understanding. Pt presenting with impairments listed below, will follow acutely. Patient will benefit from intensive inpatient follow up therapy, >3 hours/day to maximize safety/independence with ADLs/funcitional mobility.      Recommendations for follow up therapy are one component of a multi-disciplinary discharge planning process, led by the attending physician.  Recommendations may be updated based on patient status, additional functional criteria and insurance authorization.   Assistance Recommended at Discharge Frequent or constant Supervision/Assistance  Patient can return home with the following A lot of help with walking and/or transfers;A lot of help with bathing/dressing/bathroom;Assistance with cooking/housework;Direct supervision/assist for medications management;Direct supervision/assist for financial management;Assist for transportation;Help with stairs or ramp for entrance    Functional Status Assessment  Patient has had a recent decline in their functional status and demonstrates the ability to make significant improvements in function in a reasonable and predictable amount of time.  Equipment  Recommendations  Other (comment) (defer)    Recommendations for Other Services PT consult;Rehab consult     Precautions / Restrictions Precautions Precautions: Fall Precaution Comments: Wound vac L LE Required Braces or Orthoses: Other Brace Other Brace: CAM Boot for L LE Restrictions Weight Bearing Restrictions: Yes RLE Weight Bearing: Non weight bearing LLE Weight Bearing: Non weight bearing Other Position/Activity Restrictions: CAM Boot with out of bed      Mobility Bed Mobility Overal bed mobility: Needs Assistance             General bed mobility comments: pulls self into long sitting and pivots to EOB with LLE still on bed with min A +2 and use of bed pad    Transfers Overall transfer level: Needs assistance Equipment used: 2 person hand held assist Transfers: Bed to chair/wheelchair/BSC         Anterior-Posterior transfers: Min assist, +2 physical assistance   General transfer comment: min A +2 with use of bed pad      Balance Overall balance assessment: Needs assistance   Sitting balance-Leahy Scale: Good Sitting balance - Comments: unsupported long sitting                                   ADL either performed or assessed with clinical judgement   ADL Overall ADL's : Needs assistance/impaired Eating/Feeding: Set up;Sitting   Grooming: Set up;Sitting   Upper Body Bathing: Min guard;Sitting   Lower Body Bathing: Sitting/lateral leans;Minimal assistance   Upper Body Dressing : Minimal assistance;Sitting   Lower Body Dressing: Sitting/lateral leans;Moderate assistance Lower Body Dressing Details (indicate cue type and reason): donning/doffing cam boot Toilet Transfer: Minimal assistance;+2 for physical assistance;Anterior/posterior   Toileting- Architect and Hygiene: Moderate assistance  Functional mobility during ADLs: Minimal assistance;+2 for physical assistance       Vision   Vision Assessment?: No  apparent visual deficits     Perception Perception Perception Tested?: No   Praxis Praxis Praxis tested?: Not tested    Pertinent Vitals/Pain Pain Assessment Pain Assessment: Faces Pain Score: 2  Faces Pain Scale: Hurts a little bit Pain Location: LLE Pain Descriptors / Indicators: Discomfort Pain Intervention(s): Limited activity within patient's tolerance, Monitored during session     Hand Dominance Right   Extremity/Trunk Assessment Upper Extremity Assessment Upper Extremity Assessment: Overall WFL for tasks assessed   Lower Extremity Assessment Lower Extremity Assessment: Defer to PT evaluation   Cervical / Trunk Assessment Cervical / Trunk Assessment: Normal   Communication Communication Communication: HOH   Cognition Arousal/Alertness: Awake/alert Behavior During Therapy: WFL for tasks assessed/performed Overall Cognitive Status: Within Functional Limits for tasks assessed                                       General Comments  wound vac clean, dry, intact, cam boot donned    Exercises     Shoulder Instructions      Home Living Family/patient expects to be discharged to:: Private residence Living Arrangements: Spouse/significant other;Other (Comment) (granddaughter) Available Help at Discharge: Family;Available PRN/intermittently Type of Home: House Home Access: Stairs to enter [employer] of Steps: 2 Entrance Stairs-Rails: None Home Layout: Able to live on main level with bedroom/bathroom     Bathroom Shower/Tub: Tub/shower unit;Sponge bathes at baseline   [employer]: Standard Bathroom Accessibility: Yes How Accessible: Accessible via wheelchair Home Equipment: BSC/3in1;Wheelchair - Forensic psychologist (2 wheels);Cane - single point;Grab bars - tub/shower          Prior Functioning/Environment Prior Level of Function : Needs assist             Mobility Comments: transfers with assistance to wheelchair  for mobility ADLs Comments: independent with bathing and dressing, husband and family assist with iADLs        OT Problem List: Decreased strength;Decreased range of motion;Decreased activity tolerance;Impaired balance (sitting and/or standing)      OT Treatment/Interventions: Self-care/ADL training;Therapeutic exercise;Energy conservation;DME and/or AE instruction;Therapeutic activities;Patient/family education;Balance training    OT Goals(Current goals can be found in the care plan section) Acute Rehab OT Goals Patient Stated Goal: none stated OT Goal Formulation: With patient Time For Goal Achievement: 09/01/22 Potential to Achieve Goals: Good ADL Goals Pt Will Perform Lower Body Dressing: with supervision;bed level Pt Will Transfer to Toilet: anterior/posterior transfer;with transfer board;bedside commode;with supervision Additional ADL Goal #1: pt will verbalize wear schedule of cam boot to promote ind with ADLs  OT Frequency: Min 1X/week    Co-evaluation              AM-PAC OT "6 Clicks" Daily Activity     Outcome Measure Help from another person eating meals?: None Help from another person taking care of personal grooming?: A Little Help from another person toileting, which includes using toliet, bedpan, or urinal?: A Lot Help from another person bathing (including washing, rinsing, drying)?: A Lot Help from another person to put on and taking off regular upper body clothing?: A Little Help from another person to put on and taking off regular lower body clothing?: A Lot 6 Click Score: 16   End of Session Equipment Utilized During Treatment: Other (comment) (cam boot LLE) Nurse  Communication: Mobility status;Other (comment) (A/P transfer technique for back to bed)  Activity Tolerance: Patient tolerated treatment well Patient left: in chair;with call bell/phone within reach;with chair alarm set  OT Visit Diagnosis: Muscle weakness (generalized) (M62.81);Other  abnormalities of gait and mobility (R26.89)                Time: 1610-9604 OT Time Calculation (min): 30 min Charges:  OT General Charges $OT Visit: 1 Visit OT Evaluation $OT Eval Moderate Complexity: 1 Mod  Yussef Jorge K, OTD, OTR/L SecureChat Preferred Acute Rehab [phone]   Carver Fila Koonce 08/18/2022, 4:19 PM

## 2022-08-18 NOTE — Patient Instructions (Signed)
Visit Information  Thank you for taking time to visit with me today. Please don't hesitate to contact me if I can be of assistance to you.   Following are the goals we discussed today:   Goals Addressed             This Visit's Progress    Receive Assistance Applying for Aid & Attendance Benefits, through [employer].   On track    Care Coordination Interventions:  Interventions Today    Flowsheet Row Most Recent Value  Chronic Disease   Chronic disease during today's visit Diabetes, Hypertension (HTN), Chronic Kidney Disease/End Stage Renal Disease (ESRD), Congestive Heart Failure (CHF), Other  [Neuropathy, Chronic Pain Syndrome, Anxiety, Depression, Memory Loss, Neurocognitive Disorder & Requires Maximum Assistance with Activities of Daily Living]  General Interventions   General Interventions Discussed/Reviewed General Interventions Reviewed, Annual Eye Exam, General Interventions Discussed, Labs, Annual Foot Exam, Lipid Profile, Durable Medical Equipment (DME), Vaccines, Health Screening, [employer], Doctor Visits, Communication with, Level of Care  [Primary Care Provider]  Labs Hgb A1c every 3 months, Kidney Function  Vaccines COVID-19, Flu, Pneumonia, RSV, Shingles, Tetanus/Pertussis/Diphtheria  [Encouraged]  Doctor Visits Discussed/Reviewed Doctor Visits Discussed, Doctor Visits Reviewed, Annual Wellness Visits, PCP, Specialist  [Encouraged]  Health Screening Colonoscopy, Mammogram  [Encouraged]  Durable Medical Equipment (DME) BP Cuff, Glucomoter, Environmental consultant, Wheelchair, Physicist, medical  PCP/Specialist Visits Compliance with follow-up visit  Communication with PCP/Specialists, RN  Level of Care Adult Daycare, Air traffic controller, Assisted Living, Skilled Nursing Facility, Teaching laboratory technician Medicaid, Personal Care Services  Exercise Interventions   Exercise Discussed/Reviewed Exercise Discussed, Exercise Reviewed, Physical  Activity, Assistive device use and maintanence  Physical Activity Discussed/Reviewed Physical Activity Discussed, Physical Activity Reviewed, Types of exercise, Home Exercise Program (HEP)  [Encouraged]  Education Interventions   Education Provided Provided Therapist, sports, Provided Web-based Education, Provided Education  Provided Verbal Education On Nutrition, Foot Care, Eye Care, Labs, Blood Sugar Monitoring, Mental Health/Coping with Illness, Applications, Exercise, Medication, When to see the doctor, [employer], Human resources officer, Personal Care Services  Mental Health Interventions   Mental Health Discussed/Reviewed Mental Health Discussed, Mental Health Reviewed, Coping Strategies, Crisis, Anxiety, Depression, Grief and Loss, Substance Abuse, Suicide  Nutrition Interventions   Nutrition Discussed/Reviewed Nutrition Discussed, Nutrition Reviewed, Adding fruits and vegetables, Fluid intake, Decreasing sugar intake, Increaing proteins, Decreasing fats, Decreasing salt  Pharmacy Interventions   Pharmacy Dicussed/Reviewed Pharmacy Topics Discussed, Pharmacy Topics Reviewed, Medication Adherence, Affording Medications, Referral to Pharmacist  Medication Adherence Unable to refill medication  Referral to Pharmacist Cannot afford medications  Safety Interventions   Safety Discussed/Reviewed Safety Discussed, Safety Reviewed, Fall Risk, Home Safety  Home Safety Assistive Devices, Need for home safety assessment, Refer for home visit, Contact provider for referral to PT/OT, Refer for community resources, Contact home health agency  Advanced Directive Interventions   Advanced Directives Discussed/Reviewed Advanced Directives Discussed, Advanced Directives Reviewed, Advanced Care Planning, Provided resource for acquiring and filling out documents     Active Listening & Reflection Utilized.  Emotional Support Provided. Verbalization of Feelings Encouraged.  Feelings  of Motivation & Hopefulness, with Regards to Regaining Independence, Acknowledged. Relief from Pain Symptoms Validated. Solution-Focused Strategies Implemented. Problem Solving Interventions Employed. Task-Centered Solutions Activated.  Cognitive Behavioral Therapy Initiated.  Client-Centered Therapy Performed. Acceptance & Commitment Therapy Indicated. CSW Collaboration with Patient & Daughter, [name] to Confirm Interest in Receiving Placement in Skilled Nursing Facility to [employer], Upon Medical Clearance & Discharge  from [facility]. CSW Collaboration with Antony Blackbird, TOC/LCSW with [facility], Via Secure Chat Message in [place], to Confirm Interest in Placement at [facility] [phone]) or [facility] [phone]). CSW Collaboration with Patient & Daughter, [name] to Confirm Continued Receipt of Precooked Frozen Meals, Delivered Directly to Home, through [facility] [employer] [phone]).   CSW Collaboration with Patient & Daughter, [name] to Encourage Continued Use of [employer] [phone]). CSW Collaboration with Patient & Daughter, [name] to Encourage Contact with [facility] [phone]), to Schedule Appointment to Receive Assistance with Review & Submission of Completed Application for Aid & Attendance Benefits. CSW Collaboration with Patient & Daughter, [name] to [employer] with CSW [phone]# [phone]), if They Have Questions, Need Assistance, or If Additional Social Work Needs Are Identified Between Now & Our Next Scheduled Follow-Up Outreach Call.      Our next appointment is by telephone on 08/31/2022 at 9:45 am.  Please call the care guide team at [phone] if you need to cancel or reschedule your appointment.   If you are experiencing  a Mental Health or Behavioral Health Crisis or need someone to talk to, please call the Suicide and Crisis Lifeline: 988 call the [place] National Suicide Prevention Lifeline: 571-572-5447 or TTY: 912-279-4984 TTY (352) 741-6109) to talk to a trained counselor call 1-800-273-TALK (toll free, 24 hour hotline) go to [facility] [address] [place] [phone]) call the [facility] Crisis Line: [phone] call 911  Patient verbalizes understanding of instructions and care plan provided today and agrees to view in MyChart. Active MyChart status and patient understanding of how to access instructions and care plan via MyChart confirmed with patient.     Telephone follow up appointment with care management team member scheduled for:  08/31/2022 at 9:45 am.  Danford Bad, BSW, MSW, LCSW  Licensed Clinical Social Worker  [facility]  Mailing [place]. [address], [place], [place] [address] Physical Address-[address] [place], [place] [address] Toll Free Main # [phone] Fax # [phone] Cell # [phone] [email or web address]

## 2022-08-18 NOTE — Progress Notes (Signed)
Physical Therapy Treatment Patient Details Name: Kathryn Beck MRN: [id number] DOB: 12/20/1955 Today's Date: 08/18/2022   History of Present Illness 67 y.o.female comes to ED 5/23 from podiatrist Dr Gabriel Rung office for surgical treatment of gangrene and osteomyelitis of L foot. 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 PMH: ESRD on HD, HTN, DM2, CVA, recent R LE BKA    PT Comments    Pt is much better able to participate with therapy, she is less lethargic and confused and has less pain today. Pt requires assistance for donning CAM boot. After which she demonstrates increase UE strength, she is able to pull up to longsitting with use of bedrails, once in longsitting she is able to pivot hips in bed and scoot back into recliner and scoot forward from recliner to bed with minAx2 using bed pad. Pt reports she is happy up in the recliner and scoots herself posteriorly back into recliner.  Given pt's change in ability to participate and and drive to return to PLOF, as well as home support. Patient will benefit from intensive inpatient follow up therapy, >3 hours/day   Recommendations for follow up therapy are one component of a multi-disciplinary discharge planning process, led by the attending physician.  Recommendations may be updated based on patient status, additional functional criteria and insurance authorization.  Follow Up Recommendations  Can patient physically be transported by private vehicle: No    Assistance Recommended at Discharge Frequent or constant Supervision/Assistance  Patient can return home with the following A little help with walking and/or transfers;A little help with bathing/dressing/bathroom;Assistance with cooking/housework;Direct supervision/assist for medications management;Assist for transportation;Help with stairs or ramp for entrance         Precautions / Restrictions Precautions Precautions: Fall Precaution Comments: Wound  vac L LE Required Braces or Orthoses: Other Brace Other Brace: CAM Boot for L LE Restrictions Weight Bearing Restrictions: Yes RLE Weight Bearing: Non weight bearing LLE Weight Bearing: Non weight bearing Other Position/Activity Restrictions: CAM Boot with out of bed     Mobility  Bed Mobility Overal bed mobility: Needs Assistance             General bed mobility comments: pulls self into long sitting and pivots to EOB with LLE still on bed with min A +2 and use of bed pad    Transfers Overall transfer level: Needs assistance Equipment used: 2 person hand held assist Transfers: Bed to chair/wheelchair/BSC         Anterior-Posterior transfers: Min assist, +2 physical assistance   General transfer comment: min A +2 with use of bed pad          Balance Overall balance assessment: Needs assistance   Sitting balance-Leahy Scale: Good Sitting balance - Comments: unsupported long sitting                                    Cognition Arousal/Alertness: Awake/alert Behavior During Therapy: WFL for tasks assessed/performed Overall Cognitive Status: Within Functional Limits for tasks assessed                                             General Comments General comments (skin integrity, edema, etc.): wound vac clean, dry, intact, cam boot donned      Pertinent Vitals/Pain Pain Assessment Faces  Pain Scale: Hurts a little bit Pain Location: LLE Pain Descriptors / Indicators: Discomfort    Home Living Family/patient expects to be discharged to:: Private residence Living Arrangements: Spouse/significant other;Other (Comment) (granddaughter) Available Help at Discharge: Family;Available PRN/intermittently Type of Home: House Home Access: Stairs to enter Entrance Stairs-Rails: None Entrance Stairs-Number of Steps: 2   Home Layout: Able to live on main level with bedroom/bathroom Home Equipment: BSC/3in1;Wheelchair - Geophysicist/field seismologist (2 wheels);Cane - single point;Grab bars - tub/shower          PT Goals (current goals can now be found in the care plan section) Acute Rehab PT Goals PT Goal Formulation: With patient Time For Goal Achievement: 08/31/22 Potential to Achieve Goals: Fair Progress towards PT goals: Progressing toward goals    Frequency    Min 1X/week      PT Plan Discharge plan needs to be updated       AM-PAC PT "6 Clicks" Mobility   Outcome Measure  Help needed turning from your back to your side while in a flat bed without using bedrails?: A Lot Help needed moving from lying on your back to sitting on the side of a flat bed without using bedrails?: A Lot Help needed moving to and from a bed to a chair (including a wheelchair)?: A Lot Help needed standing up from a chair using your arms (e.g., wheelchair or bedside chair)?: Total Help needed to walk in hospital room?: Total Help needed climbing 3-5 steps with a railing? : Total 6 Click Score: 9    End of Session   Activity Tolerance: Patient tolerated treatment well Patient left: in chair;with call bell/phone within reach Nurse Communication: Mobility status PT Visit Diagnosis: Other abnormalities of gait and mobility (R26.89);Muscle weakness (generalized) (M62.81);Difficulty in walking, not elsewhere classified (R26.2);Pain Pain - Right/Left: Left Pain - part of body: Ankle and joints of foot     Time: 1520-1551 PT Time Calculation (min) (ACUTE ONLY): 31 min  Charges:  $Therapeutic Activity: 8-22 mins                     Shaneen Reeser B. Beverely Risen PT, DPT Acute Rehabilitation Services Please use secure chat or  Call Office [phone]    Elon Alas [facility] 08/18/2022, 5:32 PM

## 2022-08-18 NOTE — Care Management Important Message (Signed)
Important Message  Patient Details  Name: Kathryn Beck MRN: [id number] Date of Birth: September 14, 1955   Medicare Important Message Given:  Yes     Renie Ora 08/18/2022, 9:32 AM

## 2022-08-19 DIAGNOSIS — N186 End stage renal disease: Secondary | ICD-10-CM | POA: Diagnosis not present

## 2022-08-19 DIAGNOSIS — Z992 Dependence on renal dialysis: Secondary | ICD-10-CM | POA: Diagnosis not present

## 2022-08-19 DIAGNOSIS — M86172 Other acute osteomyelitis, left ankle and foot: Secondary | ICD-10-CM | POA: Diagnosis not present

## 2022-08-19 LAB — CBC
HCT: 26 % — ABNORMAL LOW (ref 36.0–46.0)
Hemoglobin: 7.9 g/dL — ABNORMAL LOW (ref 12.0–15.0)
MCH: 24.5 pg — ABNORMAL LOW (ref 26.0–34.0)
MCHC: 30.4 g/dL (ref 30.0–36.0)
MCV: 80.7 fL (ref 80.0–100.0)
Platelets: 313 10*3/uL (ref 150–400)
RBC: 3.22 MIL/uL — ABNORMAL LOW (ref 3.87–5.11)
RDW: 17.5 % — ABNORMAL HIGH (ref 11.5–15.5)
WBC: 13.8 10*3/uL — ABNORMAL HIGH (ref 4.0–10.5)
nRBC: 0.2 % (ref 0.0–0.2)

## 2022-08-19 LAB — RENAL FUNCTION PANEL
Albumin: 2.6 g/dL — ABNORMAL LOW (ref 3.5–5.0)
Anion gap: 13 (ref 5–15)
BUN: 38 mg/dL — ABNORMAL HIGH (ref 8–23)
CO2: 27 mmol/L (ref 22–32)
Calcium: 9.6 mg/dL (ref 8.9–10.3)
Chloride: 100 mmol/L (ref 98–111)
Creatinine, Ser: 7.11 mg/dL — ABNORMAL HIGH (ref 0.44–1.00)
GFR, Estimated: 6 mL/min — ABNORMAL LOW (ref >60)
Glucose, Bld: 147 mg/dL — ABNORMAL HIGH (ref 70–99)
Phosphorus: 6 mg/dL — ABNORMAL HIGH (ref 2.5–4.6)
Potassium: 3.9 mmol/L (ref 3.5–5.1)
Sodium: 140 mmol/L (ref 135–145)

## 2022-08-19 LAB — GLUCOSE, CAPILLARY
Glucose-Capillary: 137 mg/dL — ABNORMAL HIGH (ref 70–99)
Glucose-Capillary: 96 mg/dL (ref 70–99)
Glucose-Capillary: 77 mg/dL (ref 70–99)
Glucose-Capillary: 148 mg/dL — ABNORMAL HIGH (ref 70–99)

## 2022-08-19 LAB — HEPATITIS B SURFACE ANTIBODY, QUANTITATIVE: Hep B S AB Quant (Post): 967 m[IU]/mL (ref >9.9)

## 2022-08-19 LAB — VITAMIN B1: Vitamin B1 (Thiamine): 163.3 nmol/L (ref 66.5–200.0)

## 2022-08-19 MED ORDER — CEPHALEXIN 500 MG PO CAPS: 500.0000 mg | ORAL_CAPSULE | Freq: Two times a day (BID) | ORAL | 0 refills | Status: AC

## 2022-08-19 NOTE — Discharge Summary (Signed)
Physician Discharge Summary  Kathryn Beck ZOX:[id number] DOB: 1955-11-22 DOA: 08/13/2022  PCP: [name], MD  Admit date: 08/13/2022 Discharge date: 08/20/2022 30 Day Unplanned Readmission Risk Score    Flowsheet Row ED to Hosp-Admission (Current) from 08/13/2022 in [facility] 4E CV SURGICAL PROGRESSIVE CARE  30 Day Unplanned Readmission Risk Score (%) 30.16 Filed at 08/19/2022 1200       This score is the patient's risk of an unplanned readmission within 30 days of being discharged (0 -100%). The score is based on dignosis, age, lab data, medications, orders, and past utilization.   Low:  0-14.9   Medium: 15-21.9   High: 22-29.9   Extreme: 30 and above          Admitted From: Home Disposition: SNF  Recommendations for Outpatient Follow-up:  Follow up with PCP in 1-2 weeks Please obtain BMP/CBC in one week Follow-up with podiatry as scheduled next week. Please follow up with your PCP on the following pending results: Unresulted Labs (From admission, onward)     Start     Ordered   08/21/22 0500  Renal function panel  Once,   R       Question:  Specimen collection method  Answer:  Lab=Lab collect   08/20/22 1234   08/21/22 0500  CBC with Differential/Platelet  Once,   R       Question:  Specimen collection method  Answer:  Lab=Lab collect   08/20/22 1234   08/14/22 1700  Vitamin C  Once,   R        08/14/22 1700              Home Health: None Equipment/Devices: None  Discharge Condition: Stable CODE STATUS: Full code Diet recommendation: Cardiac/renal   Brief/Interim Summary: Kathryn Beck is a 67 y.o. female with medical history significant of ESRD on HD, MWF,, HTN, DM2, CVA, recent RLE BKA performed by Dr. Lajoyce Corners 05/29/22, critical left lower extremity ischemia with great toe and second toe ulceration with PVD s/p s/p angiogram with lithotripsy, left superficial femoral artery and popliteal artery Dr Myra Gianotti on aspirin Plavix and Zetia as unable to take  statin due to allergy, was seen by podiatry in the office due to nonhealing left foot ulcer and was sent to the hospital for potential surgical intervention which is scheduled on Saturday.  Admitted under hospital service.  Details below.  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  Antibiotics IV discontinued and started on Keflex for 7 days by podiatry.  Seen by PT OT, they recommend SNF.  TOC consulted.  Patient medically stable.  At the time of dictation, we are waiting for insurance authorization.  Patient will be discharged to SNF I  ESRD on HD: On MWF schedule.  Nephrology on board.  Getting dialysis on a schedule.   Chronic pain syndrome: Continue home Lyrica and Percocet.   Essential hypertension: Controlled.  Continue metoprolol.   Type 2 diabetes mellitus:  A1c 6.3.  Resume home medications.  Discharge plan was discussed with patient and/or family member and they verbalized understanding and agreed with it.   Discharge Diagnoses:  Principal Problem:   Acute osteomyelitis of toe of left foot (HCC) Active Problems:   ESRD on hemodialysis (HCC)   Type 2 diabetes mellitus with hyperglycemia (HCC)   HTN (hypertension)   Chronic pain syndrome   Equinus contracture of left ankle    Discharge  Instructions  Discharge Instructions     Diet - low sodium heart healthy   Complete by: As directed    Increase activity slowly   Complete by: As directed    No wound care   Complete by: As directed       Allergies as of 08/20/2022       Reactions   Ace Inhibitors Anaphylaxis, Swelling   Penicillins Itching, Swelling, Other (See Comments)   Did it involve swelling of the face/tongue/throat, SOB, or low BP? Unknown Did it involve sudden or severe rash/hives, skin peeling, or any reaction on the inside of your mouth or nose? Unknown Did you need to seek medical attention at a hospital or doctor's office?  Unknown When did it last happen?      years  If all above answers are "NO", may proceed with cephalosporin use.   Statins Other (See Comments)   elevated LFT's   Albuterol Swelling        Medication List     STOP taking these medications    doxycycline 100 MG tablet Commonly known as: VIBRA-TABS       TAKE these medications    aspirin 81 MG chewable tablet CHEW TWO TABLETS BY MOUTH EVERY DAY   azelastine 0.05 % ophthalmic solution Commonly known as: OPTIVAR Place 1 drop into both eyes 2 (two) times daily.   calcitRIOL 0.25 MCG capsule Commonly known as: ROCALTROL TAKE 1 CAPSULE (0.25 MCG TOTAL) BY MOUTH 2 (TWO) TIMES DAILY WITH A MEAL.   cephALEXin 500 MG capsule Commonly known as: KEFLEX Take 1 capsule (500 mg total) by mouth every 12 (twelve) hours for 4 days.   DULoxetine 60 MG capsule Commonly known as: CYMBALTA TAKE (1) CAPSULE BY MOUTH ONCE DAILY. What changed: See the new instructions.   ezetimibe 10 MG tablet Commonly known as: ZETIA Take 1 tablet (10 mg total) by mouth daily.   Fiasp FlexTouch 100 UNIT/ML FlexTouch Pen Generic drug: insulin aspart Inject 10-20 Units into the skin See admin instructions. Inj 10-20 three times daily via sliding scale   FreeStyle Libre 2 Reader Devi 1 each by Does not apply route daily.   FreeStyle Libre 2 Sensor Misc 1 each by Does not apply route every 14 (fourteen) days.   furosemide 80 MG tablet Commonly known as: LASIX Take 80 mg by mouth 2 (two) times daily.   hydrOXYzine 25 MG tablet Commonly known as: ATARAX Take 25 mg by mouth every 12 (twelve) hours as needed for itching.   Insulin Pen Needle 32G X 4 MM Misc Use 4x a day   metoprolol succinate 50 MG 24 hr tablet Commonly known as: TOPROL-XL TAKE (1) TABLET BY MOUTH DAILY WITH FOOD *TAKE AFTER DIALYSIS* What changed:  how much to take how to take this when to take this additional instructions   OneTouch Delica Lancets 33G Misc Use to check  blood sugar 4X daily.   OneTouch Verio test strip Generic drug: glucose blood Use as instructed to check blood sugar 4X daily.   OneTouch Verio w/Device Kit Use to check blood sugar 4X daily.   oxyCODONE-acetaminophen 5-325 MG tablet Commonly known as: PERCOCET/ROXICET Take 1 tablet by mouth every 4 (four) hours as needed.   pregabalin 50 MG capsule Commonly known as: Lyrica Take 1 capsule (50 mg total) by mouth 3 (three) times daily.   Rena-Vite Rx 1 MG Tabs Take 1 tablet by mouth daily.   sertraline 50 MG tablet Commonly known as: ZOLOFT TAKE  ONE TABLET BY MOUTH EVERY DAY   Toujeo Max SoloStar 300 UNIT/ML Solostar Pen Generic drug: insulin glargine (2 Unit Dial) Inject 30 Units into the skin daily. May need to slowly increase the dose depending upon your blood sugar, follow-up with PCP   [name] 500 MG chewable tablet Generic drug: sucroferric oxyhydroxide Chew 500-1,000 mg by mouth See admin instructions. Take 1000mg  (2 tablets) by mouth with meals and 500mg  (1 tablet) with snacks        Follow-up Information     [name], MD Follow up in 1 week(s).   Specialty: Family Medicine Contact information: [address] [place] [address] [phone]         Dialysis, [facility]. Go on 08/21/2022.   Why: Schedule is Monday/Wednesday/Friday. Arrive at 6:15 am for 6:30 am chair time. Contact information: [address] [place] [facility] [address] [phone]         [name], DPM. Schedule an appointment as soon as possible for a visit in 1 week(s).   Specialty: Podiatry Contact information: [address] [place] [address] [phone]                Allergies  Allergen Reactions   Ace Inhibitors Anaphylaxis and Swelling   Penicillins Itching, Swelling and Other (See Comments)    Did it involve swelling of the face/tongue/throat, SOB, or low BP? Unknown Did it involve sudden or severe rash/hives, skin  peeling, or any reaction on the inside of your mouth or nose? Unknown Did you need to seek medical attention at a hospital or doctor's office? Unknown When did it last happen?      years  If all above answers are "NO", may proceed with cephalosporin use.    Statins Other (See Comments)    elevated LFT's     Albuterol Swelling    Consultations: Nephrology and podiatry   Procedures/Studies: DG Foot Complete Left  Result Date: 08/13/2022 Please see detailed radiograph report in office note.  DG Chest 2 View  Result Date: 08/13/2022 CLINICAL DATA:  Toe infection. EXAM: CHEST - 2 VIEW COMPARISON:  Chest radiograph 04/24/2022 FINDINGS: The cardiac silhouette remains mildly enlarged. Lung volumes are low. No acute airspace opacity, edema, sizable pleural effusion, or pneumothorax is identified. Scarring is again noted in the left mid to lower lung. No acute osseous abnormality is seen. IMPRESSION: No active cardiopulmonary disease. Electronically Signed   By: Sebastian Ache M.D.   On: 08/13/2022 15:28   DG Foot Complete Left  Result Date: 08/06/2022 Please see detailed radiograph report in office note.  PERIPHERAL VASCULAR CATHETERIZATION  Result Date: 07/21/2022 Images from the original result were not included. Patient name: JENICA SZYMKOWIAK MRN: [id number] DOB: 02/15/1956 Sex: female 07/21/2022 Pre-operative Diagnosis: Left toe ulcer Post-operative diagnosis:  Same Surgeon:  Durene Cal Procedure Performed:  1.  Ultrasound-guided access, right femoral artery  2.  Abdominal aortogram with bifemoral runoff  3.  Catheter selection, left superficial femoral artery and left popliteal artery  4.  Lithotripsy, left superficial femoral artery and popliteal artery  5.  Stent, left superficial femoral and popliteal artery  6.  Conscious sedation, 100 minutes  7.  Closure device, Celt Indications: This is a 67 year old female with end-stage renal disease and history of right leg amputation who has  developed a new wound on her left foot.  She comes in today for further evaluation. Procedure:  The patient was identified in the holding area and taken  to room 8.  The patient was then placed supine on the table and prepped and draped in the usual sterile fashion.  A time out was called.  Conscious sedation was administered with the use of IV fentanyl and Versed under continuous physician and nurse monitoring.  Heart rate, blood pressure, and oxygen saturation were continuously monitored.  Total sedation time was 100 minutes.  Ultrasound was used to evaluate the right common femoral artery.  It was patent .  A digital ultrasound image was acquired.  A micropuncture needle was used to access the right common femoral artery under ultrasound guidance.  An 018 wire was advanced without resistance and a micropuncture sheath was placed.  The 018 wire was removed and a benson wire was placed.  The micropuncture sheath was exchanged for a 5 french sheath.  An omniflush catheter was advanced over the wire to the level of L-1.  An abdominal angiogram was obtained.  Next, using the omniflush catheter and a benson wire, the aortic bifurcation was crossed and the catheter was placed into theleft external iliac artery and left runoff was obtained. Findings:  Aortogram: No evidence of renal artery stenosis.  The infrarenal abdominal aorta is widely patent.  Bilateral common and external iliac arteries are widely patent.  Right Lower Extremity: The visualized portions of the right common femoral artery and its distal branches are patent without significant stenosis  Left Lower Extremity: The left common femoral artery is widely patent but heavily calcified as is the profundofemoral artery.  The superficial femoral artery is heavily calcified.  In the midportion there are 2 focal areas of stenoses greater than 50% with heavy calcification.  The artery occludes at the adductor canal.  There is reconstitution of the popliteal artery  above the knee.  There is diffuse disease with greater than 50% stenosis within the below-knee popliteal artery.  There is single-vessel runoff via the peroneal artery. Intervention: After the above images were acquired the decision was made to proceed with intervention.  A 6 French 45 cm sheath was advanced into the left superficial femoral artery.  The patient was fully heparinized.  Additional images were performed with the catheter in the superficial femoral artery to better define the disease.  Next using an 035 Glidewire and quick cross catheter, the total occlusion was crossed.  The catheter was then placed in the popliteal artery and additional lower extremity imaging were obtained.  This proved that I was true lumen and also identified more clearly the popliteal disease.  I then advanced a [place] core wire into the peroneal artery.  I used a 3 x 60 coyote balloon to predilate the total occlusion and then inserted a 5 x 60 shockwave lithotripsy balloon.  Arterial lithotripsy was performed in the popliteal and superficial femoral artery at 6 atm.  I then exchanged out for a heavier wire.  A 12 g wire was then placed into the peroneal artery.  A 4.5 x 60 shockwave balloon was then used to treat the below-knee popliteal artery disease.  I elected to stent the superficial femoral artery because of the heavily calcified total occlusion.  I placed a 7 x 150 Eluvia stent and postdilated it with a 6 mm balloon.  Follow-up imaging showed excellent result however there was 2 more proximal lesions that I felt needed to be stented and so an additional 7 x 150 Eluvia stent was deployed and postdilated with a 6 mm balloon.  Completion imaging showed inline flow through the superficial  femoral, popliteal, and peroneal artery which collateralizes out onto the foot.  The groin was then closed with a Celt Impression:  #1  Total occlusion of the distal left superficial femoral and left proximal above-knee popliteal artery  successfully crossed.  This was heavily calcified so it was initially treated with shockwave intra-arterial lithotripsy followed by stenting.  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  She needs to continue on Plavix and aspirin.  She has a statin allergy.  Juleen China, M.D., FACS [facility] Office: [phone] Pager:  [phone]     Discharge Exam: Vitals:   08/20/22 0808 08/20/22 1152  BP: (!) 142/55 (!) 125/50  Pulse: 85 78  Resp: 20 20  Temp: 98.5 F (36.9 C) 98.8 F (37.1 C)  SpO2: 98% 94%   Vitals:   08/19/22 2307 08/20/22 0322 08/20/22 0808 08/20/22 1152  BP: (!) 99/46 (!) 118/49 (!) 142/55 (!) 125/50  Pulse: 71 66 85 78  Resp: 15 15 20 20   Temp: 97.9 F (36.6 C) 98 F (36.7 C) 98.5 F (36.9 C) 98.8 F (37.1 C)  TempSrc: Oral Oral Oral Oral  SpO2: 94% 93% 98% 94%  Weight:      Height:        General: Pt is alert, awake, not in acute distress Cardiovascular: RRR, S1/S2 +, no rubs, no gallops Respiratory: CTA bilaterally, no wheezing, no rhonchi Abdominal: Soft, NT, ND, bowel sounds + Extremities: no edema, no cyanosis, wound VAC attached to the left foot.  Right BKA.    The results of significant diagnostics from this hospitalization (including imaging, microbiology, ancillary and laboratory) are listed below for reference.     Microbiology: Recent Results (from the past 240 hour(s))  Blood culture (routine x 2)     Status: None   Collection Time: 08/13/22  7:45 PM   Specimen: BLOOD  Result Value Ref Range Status   Specimen Description BLOOD SITE NOT SPECIFIED  Final   Special Requests   Final    BOTTLES DRAWN AEROBIC AND ANAEROBIC Blood  Culture adequate volume   Culture   Final    NO GROWTH 5 DAYS Performed at [facility], [address], [place], [place] [address]    Report Status 08/19/2022 FINAL  Final  Blood culture (routine x 2)     Status: None   Collection Time: 08/13/22  7:53 PM   Specimen: BLOOD RIGHT HAND  Result Value Ref Range Status   Specimen Description BLOOD RIGHT HAND  Final   Special Requests   Final    BOTTLES DRAWN AEROBIC AND ANAEROBIC Blood Culture adequate volume   Culture   Final    NO GROWTH 5 DAYS Performed at [facility], [address], [place], [place] [address]    Report Status 08/18/2022 FINAL  Final  MRSA Next Gen by PCR, Nasal     Status: None   Collection Time: 08/13/22  8:02 PM   Specimen: Nasal Mucosa; Nasal Swab  Result Value Ref Range Status   MRSA by PCR Next Gen NOT DETECTED NOT DETECTED Final    Comment: (NOTE) The GeneXpert MRSA Assay (FDA approved for NASAL specimens only), is one component  of a comprehensive MRSA colonization surveillance program. It is not intended to diagnose MRSA infection nor to guide or monitor treatment for MRSA infections. Test performance is not FDA approved in patients less than 22 years old. Performed at [facility], [address] [place], [place] [address]      Labs: BNP (last 3 results) No results for input(s): "BNP" in the last 8760 hours. Basic Metabolic Panel: Recent Labs  Lab 08/13/22 1452 08/14/22 0857 08/15/22 0612 08/17/22 0737 08/19/22 0637  NA 140 141 137 145 140  K 4.0 4.2 3.6 4.8 3.9  CL 101 101 97* 103 100  CO2 26 25 28 25 27   GLUCOSE 265* 140* 101* 121* 147*  BUN 22 31* 13 43* 38*  CREATININE 5.20* 6.27* 3.62* 8.02* 7.11*  CALCIUM 9.1 9.1 9.0 8.9 9.6  PHOS  --  4.9* 2.5 5.8* 6.0*   Liver Function Tests: Recent Labs  Lab 08/13/22 1452 08/14/22 0857 08/15/22 0612 08/17/22 0737 08/19/22 0637  AST 27  --   --   --   --   ALT 28  --   --   --   --   ALKPHOS 112  --   --   --   --    BILITOT 0.4  --   --   --   --   PROT 7.6  --   --   --   --   ALBUMIN 3.1* 3.0* 2.7* 2.9* 2.6*   No results for input(s): "LIPASE", "AMYLASE" in the last 168 hours. No results for input(s): "AMMONIA" in the last 168 hours. CBC: Recent Labs  Lab 08/13/22 1452 08/14/22 0841 08/16/22 0145 08/17/22 0153 08/17/22 0737 08/18/22 0052 08/19/22 0637  WBC 9.3   < > 10.2 11.9* 11.9* 11.6* 13.8*  NEUTROABS 6.3  --  7.9* 7.9*  --  8.0*  --   HGB 9.2*   < > 8.1* 8.0* 7.9* 8.2* 7.9*  HCT 30.9*   < > 26.3* 27.3* 26.9* 27.4* 26.0*  MCV 81.3   < > 79.9* 83.5 81.8 80.6 80.7  PLT 315   < > 259 253 267 274 313   < > = values in this interval not displayed.   Cardiac Enzymes: No results for input(s): "CKTOTAL", "CKMB", "CKMBINDEX", "TROPONINI" in the last 168 hours. BNP: Invalid input(s): "POCBNP" CBG: Recent Labs  Lab 08/19/22 1121 08/19/22 1619 08/19/22 2126 08/20/22 0607 08/20/22 1149  GLUCAP 96 77 148* 126* 237*   D-Dimer No results for input(s): "DDIMER" in the last 72 hours. Hgb A1c No results for input(s): "HGBA1C" in the last 72 hours. Lipid Profile No results for input(s): "CHOL", "HDL", "LDLCALC", "TRIG", "CHOLHDL", "LDLDIRECT" in the last 72 hours. Thyroid function studies No results for input(s): "TSH", "T4TOTAL", "T3FREE", "THYROIDAB" in the last 72 hours.  Invalid input(s): "FREET3" Anemia work up No results for input(s): "VITAMINB12", "FOLATE", "FERRITIN", "TIBC", "IRON", "RETICCTPCT" in the last 72 hours. Urinalysis    Component Value Date/Time   COLORURINE YELLOW 11/07/2017 2340   APPEARANCEUR HAZY (A) 11/07/2017 2340   LABSPEC 1.017 11/07/2017 2340   PHURINE 5.0 11/07/2017 2340   GLUCOSEU 150 (A) 11/07/2017 2340   HGBUR NEGATIVE 11/07/2017 2340   BILIRUBINUR NEGATIVE 11/07/2017 2340   BILIRUBINUR neg 02/15/2017 0913   KETONESUR NEGATIVE 11/07/2017 2340   PROTEINUR >=300 (A) 11/07/2017 2340   UROBILINOGEN 0.2 02/15/2017 0913   NITRITE NEGATIVE 11/07/2017  2340   LEUKOCYTESUR NEGATIVE 11/07/2017 2340   Sepsis Labs Recent Labs  Lab 08/17/22 0153 08/17/22 0737 08/18/22 0052 08/19/22 0637  WBC 11.9* 11.9* 11.6* 13.8*   Microbiology Recent Results (from the past 240 hour(s))  Blood culture (routine x 2)     Status: None   Collection Time: 08/13/22  7:45 PM   Specimen: BLOOD  Result Value Ref Range Status   Specimen Description BLOOD SITE NOT SPECIFIED  Final   Special Requests   Final    BOTTLES DRAWN AEROBIC AND ANAEROBIC Blood Culture adequate volume   Culture   Final    NO GROWTH 5 DAYS Performed at [facility], [address] [place], [place] [address]    Report Status 08/19/2022 FINAL  Final  Blood culture (routine x 2)     Status: None   Collection Time: 08/13/22  7:53 PM   Specimen: BLOOD RIGHT HAND  Result Value Ref Range Status   Specimen Description BLOOD RIGHT HAND  Final   Special Requests   Final    BOTTLES DRAWN AEROBIC AND ANAEROBIC Blood Culture adequate volume   Culture   Final    NO GROWTH 5 DAYS Performed at [facility], [address], [place], [place] [address]    Report Status 08/18/2022 FINAL  Final  MRSA Next Gen by PCR, Nasal     Status: None   Collection Time: 08/13/22  8:02 PM   Specimen: Nasal Mucosa; Nasal Swab  Result Value Ref Range Status   MRSA by PCR Next Gen NOT DETECTED NOT DETECTED Final    Comment: (NOTE) The GeneXpert MRSA Assay (FDA approved for NASAL specimens only), is one component of a comprehensive MRSA colonization surveillance program. It is not intended to diagnose MRSA infection nor to guide or monitor treatment for MRSA infections. Test performance is not FDA approved in patients less than 71 years old. Performed at [facility], [address] [place], [place] [address]      Time coordinating discharge: Over 30 minutes  SIGNED:   Meredeth Ide, MD  [facility] 08/20/2022, 2:10 PM *Please note that this is a verbal dictation therefore  any spelling or grammatical errors are due to the "Dragon Medical One" system interpretation. If 7PM-7AM, please contact night-coverage [email or web address]

## 2022-08-19 NOTE — TOC Progression Note (Signed)
Transition of Care [facility]) - Progression Note    Patient Details  Name: Kathryn Beck MRN: [id number] Date of Birth: 17-Jul-1955  Transition of Care [facility] CM/SW Contact  Delilah Shan, LCSWA Phone Number: 08/19/2022, 2:17 PM  Clinical Narrative:     Insurance authorization started for patient. Auth # [id number]. Insurance authorization currently pending for SNF. CSW will continue to follow.  Expected Discharge Plan: Skilled Nursing Facility Barriers to Discharge: Continued Medical Work up  Expected Discharge Plan and Services In-house Referral: Clinical Social Work Discharge Planning Services: CM Consult Post Acute Care Choice: IP Rehab Living arrangements for the past 2 months: Single Family Home                                       Social Determinants of Health (SDOH) Interventions SDOH Screenings   Food Insecurity: No Food Insecurity (07/23/2022)  Housing: Low Risk  (07/18/2022)  Transportation Needs: No Transportation Needs (07/23/2022)  Utilities: Not At Risk (07/18/2022)  Alcohol Screen: Low Risk  (05/12/2022)  Depression (PHQ2-9): Low Risk  (07/07/2022)  Recent Concern: Depression (PHQ2-9) - High Risk (05/12/2022)  Financial Resource Strain: Medium Risk (05/12/2022)  Physical Activity: Inactive (05/12/2022)  Social Connections: Socially Integrated (05/12/2022)  Stress: No Stress Concern Present (05/12/2022)  Tobacco Use: Low Risk  (08/18/2022)    Readmission Risk Interventions    05/01/2022    3:54 PM  Readmission Risk Prevention Plan  Transportation Screening Complete  HRI or Home Care Consult Complete  Palliative Care Screening Not Applicable  Medication Review (RN Care Manager) Complete

## 2022-08-19 NOTE — Progress Notes (Signed)
Received patient in bed.Alert and oriented x 4.Hemo treatment in progress.   Access used : Left upper arm AVF that worked well.  Duration of treatment : 3.5 hours.  Fluid removed : Achieved prescribed 1.7 liters UF goal.  Hemo comment: Tolerated treatment well.  Hand off to the patient's nurse.

## 2022-08-19 NOTE — Progress Notes (Signed)
Inpatient Rehab Admissions Coordinator:   Per updated therapy recommendations pt was screened for CIR by Estill Dooms, PT, DPT.  Note pt has SNF offer with insurance pending for [facility].  Will not pursue CIR at this time.  If something changes, please let me know.   Estill Dooms, PT, DPT Admissions Coordinator [phone] 08/19/22  2:57 PM

## 2022-08-19 NOTE — TOC Progression Note (Signed)
Transition of Care [facility]) - Progression Note    Patient Details  Name: Kathryn Beck MRN: [id number] Date of Birth: 1955/08/13  Transition of Care [facility]) CM/SW Contact  Eduard Roux, [place] Phone Number: 08/19/2022, 1:11 PM  Clinical Narrative:     CSW met with patient at bedside. CSW introduced self and explained role. Patient accepted bed offer for [facility] confirmed availability & can accommodate dialysis. Auth for SNF started     Antony Blackbird, MSW, LCSW Clinical Social Worker    Expected Discharge Plan: Skilled Nursing Facility Barriers to Discharge: Continued Medical Work up  Expected Discharge Plan and Services In-house Referral: Clinical Social Work Discharge Planning Services: CM Consult Post Acute Care Choice: IP Rehab Living arrangements for the past 2 months: Single Family Home                                       Social Determinants of Health (SDOH) Interventions SDOH Screenings   Food Insecurity: No Food Insecurity (07/23/2022)  Housing: Low Risk  (07/18/2022)  Transportation Needs: No Transportation Needs (07/23/2022)  Utilities: Not At Risk (07/18/2022)  Alcohol Screen: Low Risk  (05/12/2022)  Depression (PHQ2-9): Low Risk  (07/07/2022)  Recent Concern: Depression (PHQ2-9) - High Risk (05/12/2022)  Financial Resource Strain: Medium Risk (05/12/2022)  Physical Activity: Inactive (05/12/2022)  Social Connections: Socially Integrated (05/12/2022)  Stress: No Stress Concern Present (05/12/2022)  Tobacco Use: Low Risk  (08/18/2022)    Readmission Risk Interventions    05/01/2022    3:54 PM  Readmission Risk Prevention Plan  Transportation Screening Complete  HRI or Home Care Consult Complete  Palliative Care Screening Not Applicable  Medication Review (RN Care Manager) Complete

## 2022-08-19 NOTE — Progress Notes (Signed)
   Subjective: 4 Days Post-Op Procedure(s) (LRB): TRANSMETATARSAL AMPUTATION (Left) ACHILLES TENDON LENGTHENING DOS: 08/15/2022.  Surgeon: Dr. Sharl Ma DPM  Patient doing well.  Resting comfortably in bed. Provena incisional wound vac in place.  Objective: Vital signs in last 24 hours: Temp:  [97.6 F (36.4 C)-98.5 F (36.9 C)] 97.9 F (36.6 C) (05/29 1125) Pulse Rate:  [66-79] 72 (05/29 1125) Resp:  [11-18] 14 (05/29 1125) BP: (90-144)/(44-55) 106/51 (05/29 1125) SpO2:  [91 %-100 %] 100 % (05/29 1033) Weight:  [79.3 kg-81.1 kg] 79.3 kg (05/29 1004)  Recent Labs    08/17/22 0153 08/17/22 0737 08/18/22 0052 08/19/22 0637  HGB 8.0* 7.9* 8.2* 7.9*   Recent Labs    08/18/22 0052 08/19/22 0637  WBC 11.6* 13.8*  RBC 3.40* 3.22*  HCT 27.4* 26.0*  PLT 274 313   Recent Labs    08/17/22 0737 08/19/22 0637  NA 145 140  K 4.8 3.9  CL 103 100  CO2 25 27  BUN 43* 38*  CREATININE 8.02* 7.11*  GLUCOSE 121* 147*  CALCIUM 8.9 9.6   No results for input(s): "LABPT", "INR" in the last 72 hours.  Physical Exam: Amputation site appears very stable with good potential for healing.  Staples intact.  Incision site well coapted.  No drainage.  No erythema.  Clinically there is no indication of residual infection  Assessment/Plan: 4 Days Post-Op Procedure(s) (LRB): TRANSMETATARSAL AMPUTATION (Left) ACHILLES TENDON LENGTHENING DOS: 08/15/2022.  Dr. Sharl Ma  -Prevena incisional wound VAC removed today. -From a surgical standpoint patient okay for discharge.  Planning discharge to SNF [facility]. -Wound care orders: Dressing changes 2x/week.  Pain amputation stump with Betadine.  Apply nonadherent xeroform followed by dry sterile dressings and Ace wrap. -NWB CAM boot -f/u in-office with surgeon, Dr. Lilian Kapur, 2 weeks post discharge -Podiatry will sign off   Felecia Shelling 08/19/2022, 1:37 PM  Felecia Shelling, DPM [facility]  Dr. Felecia Shelling,  DPM    [address] [place], [place] [address]                Office [phone]  Fax [phone]

## 2022-08-19 NOTE — Progress Notes (Signed)
Physical Therapy Treatment Patient Details Name: Kathryn Beck MRN: [id number] DOB: [birth year] Today's Date: 08/19/2022   History of Present Illness 67 y.o.female comes to ED 5/23 from podiatrist Dr Gabriel Rung office for surgical treatment of gangrene and osteomyelitis of L foot. 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 PMH: ESRD on HD, HTN, DM2, CVA, recent R LE BKA.    PT Comments    Pt received in supine, lethargic after HD in AM but agreeable to therapy session and with good participation as able for transfer training and supine BLE exercises for strengthening. Pt needing up to modA for posterior scoot transfer OOB to chair and pt's female family member present to assist with transfer for safety. Pt able to verbalize BLE NWB precs, pt compliant with LLE NWB throughout while scooting. Reviewed safe technique with family and nursing staff for return transfer via anterior vs lateral scoot from drop arm chair back to bed. Pt also has drop arm BSC in her room if needed. Pt continues to benefit from PT services to progress toward functional mobility goals.   Recommendations for follow up therapy are one component of a multi-disciplinary discharge planning process, led by the attending physician.  Recommendations may be updated based on patient status, additional functional criteria and insurance authorization.  Follow Up Recommendations  Can patient physically be transported by private vehicle: No    Assistance Recommended at Discharge Frequent or constant Supervision/Assistance  Patient can return home with the following A little help with walking and/or transfers;A little help with bathing/dressing/bathroom;Assistance with cooking/housework;Direct supervision/assist for medications management;Assist for transportation;Help with stairs or ramp for entrance   Equipment Recommendations  None recommended by PT;Other (comment) (consider slide board if she doesn't  already have one)    Recommendations for Other Services Rehab consult     Precautions / Restrictions Precautions Precautions: Fall Precaution Comments: Wound vac L LE Required Braces or Orthoses: Other Brace Other Brace: CAM Boot for L LE Restrictions Weight Bearing Restrictions: Yes RLE Weight Bearing: Non weight bearing LLE Weight Bearing: Non weight bearing Other Position/Activity Restrictions: NWB to LLE primarily; Pt may utilize the CAM boot for transferring to and from bed/chair/wheelchair (per McDonald MD from Podiatry 5/27)     Mobility  Bed Mobility Overal bed mobility: Needs Assistance Bed Mobility: Supine to Sit, Rolling, Sit to Supine Rolling: Min guard   Supine to sit: Min assist Sit to supine: Min guard   General bed mobility comments: supine to long sitting x2 reps with minA using bed rail/HHA    Transfers Overall transfer level: Needs assistance Equipment used:  (bed pad support +2) Transfers: Bed to chair/wheelchair/BSC         Anterior-Posterior transfers: Min assist, +2 physical assistance, Mod assist   General transfer comment: pt turned and performed posterior scoots with initial modA, after return to supine for readjustment of bed pads, pt performed posterior seated scoot to recliner with minA +2 and dense cues for technique, trunk support to prevent backward LOB.    Ambulation/Gait                   Stairs             Wheelchair Mobility    Modified Rankin (Stroke Patients Only)       Balance Overall balance assessment: Needs assistance Sitting-balance support: Bilateral upper extremity supported, Feet unsupported Sitting balance-Leahy Scale: Fair Sitting balance - Comments: pt using BUE support for long sitting, but some posterior  LOB due more to fatigue/lethargy after HD       Standing balance comment: UTA; pt NWB BLE                            Cognition Arousal/Alertness: Lethargic Behavior During  Therapy: Flat affect Overall Cognitive Status: Impaired/Different from baseline Area of Impairment: Memory, Following commands, Orientation, Safety/judgement, Problem solving                 Orientation Level: Disoriented to, Time (pt unable to state day of week despite hints, she was oriented to May. When PTA pointed to calendar on wall, pt able to read off Calendar date/day of week.)   Memory: Decreased short-term memory Following Commands: Follows one step commands with increased time Safety/Judgement: Decreased awareness of safety, Decreased awareness of deficits   Problem Solving: Slow processing, Decreased initiation, Difficulty sequencing, Requires verbal cues General Comments: Pt with very slowed response to questions, per her daughter this is typical for her on HD days, but on non-HD days she is more oriented. Increased time/multimodal cues at times needed due to slow processing/lethargy.        Exercises Other Exercises Other Exercises: supine BLE AROM: heel slides x10 reps, hip abduction x10 reps, RLE hip flexion x10 reps Other Exercises: chair push-ups x5 reps in recliner x2 sets    General Comments General comments (skin integrity, edema, etc.): Pt received on 3L O2 [place] and SpO2 100%, per pt/family, she is normally on RA at home, OK per RN to wean to 2L then 1L, still satting 100% so weaned to RA and SpO2 99% on RA resting in chair at end of session. HR 70-74 bpm seated.      Pertinent Vitals/Pain Pain Assessment Pain Assessment: Faces Faces Pain Scale: Hurts a little bit Pain Location: LLE Pain Descriptors / Indicators: Discomfort Pain Intervention(s): Monitored during session, Repositioned     PT Goals (current goals can now be found in the care plan section) Acute Rehab PT Goals Patient Stated Goal: have less pain PT Goal Formulation: With patient Time For Goal Achievement: 08/31/22 Progress towards PT goals: Progressing toward goals    Frequency     Min 1X/week      PT Plan Current plan remains appropriate       AM-PAC PT "6 Clicks" Mobility   Outcome Measure  Help needed turning from your back to your side while in a flat bed without using bedrails?: A Lot (w/o rail) Help needed moving from lying on your back to sitting on the side of a flat bed without using bedrails?: A Little Help needed moving to and from a bed to a chair (including a wheelchair)?: A Lot Help needed standing up from a chair using your arms (e.g., wheelchair or bedside chair)?: Total Help needed to walk in hospital room?: Total Help needed climbing 3-5 steps with a railing? : Total 6 Click Score: 10    End of Session Equipment Utilized During Treatment: Gait belt;Other (comment);Oxygen (weaned to RA during session) Activity Tolerance: Patient tolerated treatment well;Patient limited by lethargy Patient left: in chair;with call bell/phone within reach;with chair alarm set;with family/visitor present (daughter in room with her) Nurse Communication: Mobility status;Weight bearing status PT Visit Diagnosis: Other abnormalities of gait and mobility (R26.89);Muscle weakness (generalized) (M62.81);Difficulty in walking, not elsewhere classified (R26.2);Pain Pain - Right/Left: Left Pain - part of body: Ankle and joints of foot     Time: 7846-9629 PT Time  Calculation (min) (ACUTE ONLY): 32 min  Charges:  $Therapeutic Exercise: 8-22 mins $Therapeutic Activity: 8-22 mins                     Kathryn Beck P., PTA Acute Rehabilitation Services Secure Chat Preferred 9a-5:30pm Office: [phone]    Dorathy Kinsman [facility] 08/19/2022, 3:13 PM

## 2022-08-19 NOTE — Progress Notes (Signed)
[facility] Progress Note   Subjective:    Seen and examined patient on HD. Tolerating UFG 2L. BP 101/52. She denies SOB, CP, dizziness, and N/V.   Objective Vitals:   08/19/22 0945 08/19/22 0958 08/19/22 1004 08/19/22 1012  BP: (!) 93/50 (!) 90/54 (!) 101/53   Pulse: 76  76   Resp: 11 14 14 15   Temp:   97.8 F (36.6 C)   TempSrc:      SpO2: 99% 96% 98%   Weight:   79.3 kg   Height:       Physical Exam General: Awake, alert, on RA, NAD Heart: S1 and S2; No murmurs, gallops, or rubs Lungs: Clear anteriorly and laterally; No wheezes, rales, or rhonchi Abdomen: Soft and non-tender Extremities: No LE edema; L TMA site with wound vac in place Dialysis Access: L AVF (+) B/T   Filed Weights   08/17/22 1214 08/19/22 0638 08/19/22 1004  Weight: 83.8 kg 81.1 kg 79.3 kg    Intake/Output Summary (Last 24 hours) at 08/19/2022 1015 Last data filed at 08/19/2022 1004 Gross per 24 hour  Intake --  Output 1.7 ml  Net -1.7 ml    Additional Objective Labs: Basic Metabolic Panel: Recent Labs  Lab 08/15/22 0612 08/17/22 0737 08/19/22 0637  NA 137 145 140  K 3.6 4.8 3.9  CL 97* 103 100  CO2 28 25 27   GLUCOSE 101* 121* 147*  BUN 13 43* 38*  CREATININE 3.62* 8.02* 7.11*  CALCIUM 9.0 8.9 9.6  PHOS 2.5 5.8* 6.0*   Liver Function Tests: Recent Labs  Lab 08/13/22 1452 08/14/22 0857 08/15/22 0612 08/17/22 0737 08/19/22 0637  AST 27  --   --   --   --   ALT 28  --   --   --   --   ALKPHOS 112  --   --   --   --   BILITOT 0.4  --   --   --   --   PROT 7.6  --   --   --   --   ALBUMIN 3.1*   < > 2.7* 2.9* 2.6*   < > = values in this interval not displayed.   No results for input(s): "LIPASE", "AMYLASE" in the last 168 hours. CBC: Recent Labs  Lab 08/16/22 0145 08/17/22 0153 08/17/22 0737 08/18/22 0052 08/19/22 0637  WBC 10.2 11.9* 11.9* 11.6* 13.8*  NEUTROABS 7.9* 7.9*  --  8.0*  --   HGB 8.1* 8.0* 7.9* 8.2* 7.9*  HCT 26.3* 27.3* 26.9* 27.4* 26.0*   MCV 79.9* 83.5 81.8 80.6 80.7  PLT 259 253 267 274 313   Blood Culture    Component Value Date/Time   SDES BLOOD RIGHT HAND 08/13/2022 1953   SPECREQUEST  08/13/2022 1953    BOTTLES DRAWN AEROBIC AND ANAEROBIC Blood Culture adequate volume   CULT  08/13/2022 1953    NO GROWTH 5 DAYS Performed at [facility], [address], [place], [place] [address]    REPTSTATUS 08/18/2022 FINAL 08/13/2022 1953    Cardiac Enzymes: No results for input(s): "CKTOTAL", "CKMB", "CKMBINDEX", "TROPONINI" in the last 168 hours. CBG: Recent Labs  Lab 08/18/22 1138 08/18/22 1604 08/18/22 2117 08/18/22 2318 08/19/22 0610  GLUCAP 170* 79 469* 202* 137*   Iron Studies: No results for input(s): "IRON", "TIBC", "TRANSFERRIN", "FERRITIN" in the last 72 hours. Lab Results  Component Value Date   INR 1.0 03/03/2019   INR 1.17 09/02/2017  Studies/Results: No results found.  Medications:   aspirin  162 mg Oral Daily   calcitRIOL  0.25 mcg Oral BID WC   cephALEXin  500 mg Oral Q12H   Chlorhexidine Gluconate Cloth  6 each Topical Q0600   darbepoetin (ARANESP) injection - DIALYSIS  150 mcg Subcutaneous Q Sat-1800   DULoxetine  60 mg Oral Daily   ezetimibe  10 mg Oral Daily   feeding supplement (NEPRO CARB STEADY)  237 mL Oral Q24H   furosemide  80 mg Oral BID   heparin  5,000 Units Subcutaneous Q8H   insulin aspart  0-9 Units Subcutaneous TID WC   insulin aspart  3 Units Subcutaneous TID WC   insulin glargine-yfgn  35 Units Subcutaneous Daily   ketotifen  1 drop Both Eyes BID   metoprolol succinate  50 mg Oral Daily   nutrition supplement (JUVEN)  1 packet Oral BID BM   pregabalin  50 mg Oral TID   sertraline  50 mg Oral Daily   sucroferric oxyhydroxide  1,000 mg Oral TID WC   sucroferric oxyhydroxide  500 mg Oral With snacks    Dialysis Orders: Per patient, she receives her outpatient hemodialysis from [facility].   Assessment/Plan: 1.  Nonhealing left lower extremity foot/toe  ulcers: Status post efforts at revascularization and outpatient antibiotic as an outpatient with poor response. Status post TMA with antibiotic switched to oral cephalexin. 2.  End-stage renal disease: Usually on a MWF dialysis schedule. Continue on schedule. On HD.  3.  Hypertension: Blood pressures marginally elevated likely associated with pain, continue to monitor on oral antihypertensive therapy and ultrafiltration with hemodialysis. Bps appears more stable today.  4.  Anemia of chronic disease: Hemoglobin and hematocrit are marginal with anemia likely exacerbated by ESA resistance in the setting of nonhealing wounds.  Darbepoetin 150 mcg given  5/25 with some postoperative hemoglobin drop noted as anticipated. No Fe while on PO ABXs. Hgb now 7.9. 5.  Secondary hyperparathyroidism: Calcium okay and phosphorus trending up, continue [name] for phosphorus binding as well as calcitriol for PTH control.  Salome Holmes, NP [facility] 08/19/2022,10:15 AM  LOS: 6 days

## 2022-08-20 DIAGNOSIS — E669 Obesity, unspecified: Secondary | ICD-10-CM | POA: Diagnosis present

## 2022-08-20 DIAGNOSIS — S91302A Unspecified open wound, left foot, initial encounter: Secondary | ICD-10-CM | POA: Diagnosis not present

## 2022-08-20 DIAGNOSIS — E1140 Type 2 diabetes mellitus with diabetic neuropathy, unspecified: Secondary | ICD-10-CM | POA: Diagnosis not present

## 2022-08-20 DIAGNOSIS — S98922A Partial traumatic amputation of left foot, level unspecified, initial encounter: Secondary | ICD-10-CM | POA: Diagnosis not present

## 2022-08-20 DIAGNOSIS — R1310 Dysphagia, unspecified: Secondary | ICD-10-CM | POA: Diagnosis not present

## 2022-08-20 DIAGNOSIS — N184 Chronic kidney disease, stage 4 (severe): Secondary | ICD-10-CM | POA: Diagnosis not present

## 2022-08-20 DIAGNOSIS — T8142XA Infection following a procedure, deep incisional surgical site, initial encounter: Secondary | ICD-10-CM | POA: Diagnosis not present

## 2022-08-20 DIAGNOSIS — D509 Iron deficiency anemia, unspecified: Secondary | ICD-10-CM | POA: Diagnosis present

## 2022-08-20 DIAGNOSIS — L299 Pruritus, unspecified: Secondary | ICD-10-CM | POA: Insufficient documentation

## 2022-08-20 DIAGNOSIS — E1151 Type 2 diabetes mellitus with diabetic peripheral angiopathy without gangrene: Secondary | ICD-10-CM | POA: Diagnosis not present

## 2022-08-20 DIAGNOSIS — Z79899 Other long term (current) drug therapy: Secondary | ICD-10-CM | POA: Diagnosis not present

## 2022-08-20 DIAGNOSIS — Z8616 Personal history of COVID-19: Secondary | ICD-10-CM | POA: Diagnosis not present

## 2022-08-20 DIAGNOSIS — R296 Repeated falls: Secondary | ICD-10-CM | POA: Diagnosis not present

## 2022-08-20 DIAGNOSIS — D631 Anemia in chronic kidney disease: Secondary | ICD-10-CM | POA: Diagnosis not present

## 2022-08-20 DIAGNOSIS — T8744 Infection of amputation stump, left lower extremity: Secondary | ICD-10-CM | POA: Diagnosis not present

## 2022-08-20 DIAGNOSIS — R41841 Cognitive communication deficit: Secondary | ICD-10-CM | POA: Diagnosis not present

## 2022-08-20 DIAGNOSIS — L97521 Non-pressure chronic ulcer of other part of left foot limited to breakdown of skin: Secondary | ICD-10-CM | POA: Diagnosis not present

## 2022-08-20 DIAGNOSIS — T8781 Dehiscence of amputation stump: Secondary | ICD-10-CM | POA: Diagnosis present

## 2022-08-20 DIAGNOSIS — Z7401 Bed confinement status: Secondary | ICD-10-CM | POA: Diagnosis not present

## 2022-08-20 DIAGNOSIS — N250 Renal osteodystrophy: Secondary | ICD-10-CM | POA: Diagnosis not present

## 2022-08-20 DIAGNOSIS — H04123 Dry eye syndrome of bilateral lacrimal glands: Secondary | ICD-10-CM | POA: Insufficient documentation

## 2022-08-20 DIAGNOSIS — Z89432 Acquired absence of left foot: Secondary | ICD-10-CM | POA: Diagnosis not present

## 2022-08-20 DIAGNOSIS — Z89412 Acquired absence of left great toe: Secondary | ICD-10-CM | POA: Diagnosis not present

## 2022-08-20 DIAGNOSIS — Z89511 Acquired absence of right leg below knee: Secondary | ICD-10-CM | POA: Diagnosis not present

## 2022-08-20 DIAGNOSIS — T8149XA Infection following a procedure, other surgical site, initial encounter: Secondary | ICD-10-CM | POA: Diagnosis present

## 2022-08-20 DIAGNOSIS — Z89422 Acquired absence of other left toe(s): Secondary | ICD-10-CM | POA: Diagnosis not present

## 2022-08-20 DIAGNOSIS — L089 Local infection of the skin and subcutaneous tissue, unspecified: Secondary | ICD-10-CM | POA: Diagnosis not present

## 2022-08-20 DIAGNOSIS — Z794 Long term (current) use of insulin: Secondary | ICD-10-CM | POA: Diagnosis not present

## 2022-08-20 DIAGNOSIS — I5032 Chronic diastolic (congestive) heart failure: Secondary | ICD-10-CM | POA: Diagnosis not present

## 2022-08-20 DIAGNOSIS — E8809 Other disorders of plasma-protein metabolism, not elsewhere classified: Secondary | ICD-10-CM | POA: Diagnosis not present

## 2022-08-20 DIAGNOSIS — I120 Hypertensive chronic kidney disease with stage 5 chronic kidney disease or end stage renal disease: Secondary | ICD-10-CM | POA: Diagnosis not present

## 2022-08-20 DIAGNOSIS — E88819 Insulin resistance, unspecified: Secondary | ICD-10-CM | POA: Diagnosis not present

## 2022-08-20 DIAGNOSIS — N186 End stage renal disease: Secondary | ICD-10-CM | POA: Diagnosis not present

## 2022-08-20 DIAGNOSIS — F32A Depression, unspecified: Secondary | ICD-10-CM | POA: Diagnosis not present

## 2022-08-20 DIAGNOSIS — Z992 Dependence on renal dialysis: Secondary | ICD-10-CM | POA: Diagnosis not present

## 2022-08-20 DIAGNOSIS — G894 Chronic pain syndrome: Secondary | ICD-10-CM | POA: Diagnosis not present

## 2022-08-20 DIAGNOSIS — E1169 Type 2 diabetes mellitus with other specified complication: Secondary | ICD-10-CM | POA: Diagnosis not present

## 2022-08-20 DIAGNOSIS — L0889 Other specified local infections of the skin and subcutaneous tissue: Secondary | ICD-10-CM | POA: Diagnosis present

## 2022-08-20 DIAGNOSIS — M86172 Other acute osteomyelitis, left ankle and foot: Secondary | ICD-10-CM | POA: Diagnosis not present

## 2022-08-20 DIAGNOSIS — M869 Osteomyelitis, unspecified: Secondary | ICD-10-CM | POA: Diagnosis not present

## 2022-08-20 DIAGNOSIS — M86072 Acute hematogenous osteomyelitis, left ankle and foot: Secondary | ICD-10-CM | POA: Diagnosis not present

## 2022-08-20 DIAGNOSIS — D638 Anemia in other chronic diseases classified elsewhere: Secondary | ICD-10-CM | POA: Diagnosis not present

## 2022-08-20 DIAGNOSIS — E1122 Type 2 diabetes mellitus with diabetic chronic kidney disease: Secondary | ICD-10-CM | POA: Diagnosis not present

## 2022-08-20 DIAGNOSIS — S99922A Unspecified injury of left foot, initial encounter: Secondary | ICD-10-CM | POA: Diagnosis not present

## 2022-08-20 DIAGNOSIS — R2689 Other abnormalities of gait and mobility: Secondary | ICD-10-CM | POA: Diagnosis not present

## 2022-08-20 DIAGNOSIS — R238 Other skin changes: Secondary | ICD-10-CM | POA: Diagnosis not present

## 2022-08-20 DIAGNOSIS — E782 Mixed hyperlipidemia: Secondary | ICD-10-CM | POA: Diagnosis present

## 2022-08-20 DIAGNOSIS — Y835 Amputation of limb(s) as the cause of abnormal reaction of the patient, or of later complication, without mention of misadventure at the time of the procedure: Secondary | ICD-10-CM | POA: Diagnosis present

## 2022-08-20 DIAGNOSIS — N2581 Secondary hyperparathyroidism of renal origin: Secondary | ICD-10-CM | POA: Diagnosis not present

## 2022-08-20 DIAGNOSIS — M86672 Other chronic osteomyelitis, left ankle and foot: Secondary | ICD-10-CM | POA: Diagnosis not present

## 2022-08-20 DIAGNOSIS — M6281 Muscle weakness (generalized): Secondary | ICD-10-CM | POA: Diagnosis not present

## 2022-08-20 DIAGNOSIS — I10 Essential (primary) hypertension: Secondary | ICD-10-CM | POA: Diagnosis not present

## 2022-08-20 DIAGNOSIS — I739 Peripheral vascular disease, unspecified: Secondary | ICD-10-CM | POA: Diagnosis not present

## 2022-08-20 DIAGNOSIS — Z4781 Encounter for orthopedic aftercare following surgical amputation: Secondary | ICD-10-CM | POA: Diagnosis not present

## 2022-08-20 LAB — GLUCOSE, CAPILLARY
Glucose-Capillary: 126 mg/dL — ABNORMAL HIGH (ref 70–99)
Glucose-Capillary: 237 mg/dL — ABNORMAL HIGH (ref 70–99)

## 2022-08-20 LAB — ZINC: Zinc: 56 ug/dL (ref 44–115)

## 2022-08-20 MED ORDER — LIDOCAINE HCL (PF) 1 % IJ SOLN
5.0000 mL | INTRAMUSCULAR | Status: DC | PRN
  Filled 2022-08-20: qty 5

## 2022-08-20 MED ORDER — LIDOCAINE-PRILOCAINE 2.5-2.5 % EX CREA
1.0000 | TOPICAL_CREAM | CUTANEOUS | Status: DC | PRN
  Filled 2022-08-20: qty 5

## 2022-08-20 MED ORDER — PENTAFLUOROPROP-TETRAFLUOROETH EX AERO: 1.0000 | INHALATION_SPRAY | CUTANEOUS | Status: DC | PRN

## 2022-08-20 MED ORDER — ALTEPLASE 2 MG IJ SOLR
2.0000 mg | Freq: Once | INTRAMUSCULAR | Status: DC | PRN
  Filled 2022-08-20: qty 2

## 2022-08-20 MED ORDER — HEPARIN SODIUM (PORCINE) 1000 UNIT/ML DIALYSIS
1000.0000 [IU] | INTRAMUSCULAR | Status: DC | PRN
  Filled 2022-08-20: qty 1

## 2022-08-20 NOTE — Progress Notes (Signed)
[facility] Progress Note   Subjective:    Seen and examined patient at bedside. No issues. Tolerated yesterday's HD with net UF 1.7L. Plan to dc to SNF and awaiting insurance approval. Next HD 5/31 (1st shift) if patient is still here.  Objective Vitals:   08/19/22 2307 08/20/22 0322 08/20/22 0808 08/20/22 1152  BP: (!) 99/46 (!) 118/49 (!) 142/55 (!) 125/50  Pulse: 71 66 85 78  Resp: 15 15 20 20   Temp: 97.9 F (36.6 C) 98 F (36.7 C) 98.5 F (36.9 C) 98.8 F (37.1 C)  TempSrc: Oral Oral Oral Oral  SpO2: 94% 93% 98% 94%  Weight:      Height:       Physical Exam General: Awake, alert, on RA, NAD Heart: S1 and S2; No murmurs, gallops, or rubs Lungs: Clear anteriorly and laterally; No wheezes, rales, or rhonchi Abdomen: Soft and non-tender Extremities: No LE edema; L TMA site with wound vac in place Dialysis Access: L AVF (+) B/T   Filed Weights   08/17/22 1214 08/19/22 0638 08/19/22 1004  Weight: 83.8 kg 81.1 kg 79.3 kg    Intake/Output Summary (Last 24 hours) at 08/20/2022 1225 Last data filed at 08/20/2022 1000 Gross per 24 hour  Intake 900 ml  Output --  Net 900 ml    Additional Objective Labs: Basic Metabolic Panel: Recent Labs  Lab 08/15/22 0612 08/17/22 0737 08/19/22 0637  NA 137 145 140  K 3.6 4.8 3.9  CL 97* 103 100  CO2 28 25 27   GLUCOSE 101* 121* 147*  BUN 13 43* 38*  CREATININE 3.62* 8.02* 7.11*  CALCIUM 9.0 8.9 9.6  PHOS 2.5 5.8* 6.0*   Liver Function Tests: Recent Labs  Lab 08/13/22 1452 08/14/22 0857 08/15/22 0612 08/17/22 0737 08/19/22 0637  AST 27  --   --   --   --   ALT 28  --   --   --   --   ALKPHOS 112  --   --   --   --   BILITOT 0.4  --   --   --   --   PROT 7.6  --   --   --   --   ALBUMIN 3.1*   < > 2.7* 2.9* 2.6*   < > = values in this interval not displayed.   No results for input(s): "LIPASE", "AMYLASE" in the last 168 hours. CBC: Recent Labs  Lab 08/16/22 0145 08/17/22 0153 08/17/22 0737  08/18/22 0052 08/19/22 0637  WBC 10.2 11.9* 11.9* 11.6* 13.8*  NEUTROABS 7.9* 7.9*  --  8.0*  --   HGB 8.1* 8.0* 7.9* 8.2* 7.9*  HCT 26.3* 27.3* 26.9* 27.4* 26.0*  MCV 79.9* 83.5 81.8 80.6 80.7  PLT 259 253 267 274 313   Blood Culture    Component Value Date/Time   SDES BLOOD RIGHT HAND 08/13/2022 1953   SPECREQUEST  08/13/2022 1953    BOTTLES DRAWN AEROBIC AND ANAEROBIC Blood Culture adequate volume   CULT  08/13/2022 1953    NO GROWTH 5 DAYS Performed at [facility], [address], [place], [place] [address]    REPTSTATUS 08/18/2022 FINAL 08/13/2022 1953    Cardiac Enzymes: No results for input(s): "CKTOTAL", "CKMB", "CKMBINDEX", "TROPONINI" in the last 168 hours. CBG: Recent Labs  Lab 08/19/22 1121 08/19/22 1619 08/19/22 2126 08/20/22 0607 08/20/22 1149  GLUCAP 96 77 148* 126* 237*   Iron Studies: No results for input(s): "IRON", "TIBC", "TRANSFERRIN", "  FERRITIN" in the last 72 hours. Lab Results  Component Value Date   INR 1.0 03/03/2019   INR 1.17 09/02/2017   Studies/Results: No results found.  Medications:   aspirin  162 mg Oral Daily   calcitRIOL  0.25 mcg Oral BID WC   cephALEXin  500 mg Oral Q12H   Chlorhexidine Gluconate Cloth  6 each Topical Q0600   darbepoetin (ARANESP) injection - DIALYSIS  150 mcg Subcutaneous Q Sat-1800   DULoxetine  60 mg Oral Daily   ezetimibe  10 mg Oral Daily   feeding supplement (NEPRO CARB STEADY)  237 mL Oral Q24H   furosemide  80 mg Oral BID   heparin  5,000 Units Subcutaneous Q8H   insulin aspart  0-9 Units Subcutaneous TID WC   insulin aspart  3 Units Subcutaneous TID WC   insulin glargine-yfgn  35 Units Subcutaneous Daily   ketotifen  1 drop Both Eyes BID   metoprolol succinate  50 mg Oral Daily   pregabalin  50 mg Oral TID   sertraline  50 mg Oral Daily   sucroferric oxyhydroxide  1,000 mg Oral TID WC   sucroferric oxyhydroxide  500 mg Oral With snacks    Dialysis Orders: Per patient, she receives  her outpatient hemodialysis from [facility].   Assessment/Plan: 1.  Nonhealing left lower extremity foot/toe ulcers: Status post efforts at revascularization and outpatient antibiotic as an outpatient with poor response. Status post TMA with antibiotic switched to oral cephalexin. 2.  End-stage renal disease: Usually on a MWF dialysis schedule. Continue on schedule. Next HD 5/31 if patient is still here. 3.  Hypertension: Blood pressures marginally elevated likely associated with pain, continue to monitor on oral antihypertensive therapy and ultrafiltration with hemodialysis. Blood pressures soft but stable. 4.  Anemia of chronic disease: Hemoglobin and hematocrit are marginal with anemia likely exacerbated by ESA resistance in the setting of nonhealing wounds.  Darbepoetin 150 mcg given  5/25 with some postoperative hemoglobin drop noted as anticipated. No Fe while on PO ABXs. Hgb now 7.9. 5.  Secondary hyperparathyroidism: Calcium okay and phosphorus trending up, continue [name] for phosphorus binding as well as calcitriol for PTH control. 6. Dispo - Plan to dc to SNF. Awaiting insurance approval.  Salome Holmes, NP [facility] 08/20/2022,12:25 PM  LOS: 7 days

## 2022-08-20 NOTE — Consult Note (Signed)
   [facility] CM Inpatient Consult   08/20/2022  Kathryn Beck 06-28-1955 [id number]  [facility] [[facility]]  Accountable Care Organization [ACO] Patient: [name] Medicare Choice PPO  Primary Care Provider:  [name], MD with [facility]   Patient was in active status with [facility] LCSW for [facility] Aide and Attendant coordination and is currently aware of inpatient hospitalization Patient was reviewed for less than 30 days readmission extreme high risk score 7 day length of stay with barriers to care for community support needs.  Patient was screened for hospitalization and to assess for post hospital [facility] Coordination needs.  Patient is being considered for a skilled nursing facility level of care for post hospital transition.  Patient is currently for a SNF rehab level of care for post hospital transition at [facility].   Plan:  Will follow up with [facility] LCSW regarding transitional care needs for returning to post facility care coordination needs to return to community needs awareness. In basket message sent.  For questions or referrals, please contact:   Charlesetta Shanks, RN BSN CCM [facility]  [phone] business mobile phone Toll free office [phone]  *Concierge Line  [phone] Fax number: [phone] [email or web address]

## 2022-08-20 NOTE — TOC Progression Note (Signed)
Transition of Care [facility]) - Progression Note    Patient Details  Name: Kathryn Beck MRN: [id number] Date of Birth: 06/13/1955  Transition of Care [facility]) CM/SW Contact  Eduard Roux, [place] Phone Number: 08/20/2022, 1:19 PM  Clinical Narrative:     Received insurance authorization [id number] reference # [id number] 5/30-6/03  Expected Discharge Plan: Skilled Nursing Facility Barriers to Discharge: Continued Medical Work up  Expected Discharge Plan and Services In-house Referral: Clinical Social Work Discharge Planning Services: CM Consult Post Acute Care Choice: IP Rehab Living arrangements for the past 2 months: Single Family Home                                       Social Determinants of Health (SDOH) Interventions SDOH Screenings   Food Insecurity: No Food Insecurity (07/23/2022)  Housing: Low Risk  (07/18/2022)  Transportation Needs: No Transportation Needs (07/23/2022)  Utilities: Not At Risk (07/18/2022)  Alcohol Screen: Low Risk  (05/12/2022)  Depression (PHQ2-9): Low Risk  (07/07/2022)  Recent Concern: Depression (PHQ2-9) - High Risk (05/12/2022)  Financial Resource Strain: Medium Risk (05/12/2022)  Physical Activity: Inactive (05/12/2022)  Social Connections: Socially Integrated (05/12/2022)  Stress: No Stress Concern Present (05/12/2022)  Tobacco Use: Low Risk  (08/18/2022)    Readmission Risk Interventions    05/01/2022    3:54 PM  Readmission Risk Prevention Plan  Transportation Screening Complete  HRI or Home Care Consult Complete  Palliative Care Screening Not Applicable  Medication Review (RN Care Manager) Complete

## 2022-08-20 NOTE — TOC Transition Note (Signed)
Transition of Care [facility]) - CM/SW Discharge Note   Patient Details  Name: Kathryn Beck MRN: [id number] Date of Birth: [birth year]  Transition of Care [facility]) CM/SW Contact:  Eduard Roux, LCSW Phone Number: 08/20/2022, 2:46 PM   Clinical Narrative:     Patient will Discharge to: [facility] Discharge Date: 08/20/2022 Family Notified: daughter  Transport By: Sharin Mons   Per MD patient is ready for discharge. RN, patient, and facility notified of discharge. Discharge Summary sent to facility. RN given number for [phone] Ambulance transport requested for patient.   Clinical Social Worker signing off.  Antony Blackbird, MSW, LCSW Clinical Social Worker      Final next level of care: Skilled Nursing Facility Barriers to Discharge: Barriers Resolved   Patient Goals and CMS Choice CMS Medicare.gov Compare Post Acute Care list provided to:: Patient Represenative (must comment) (daughter-Kathryn Beck) Choice offered to / list presented to : Adult Children  Discharge Placement                Patient chooses bed at:  [facility]) Patient to be transferred to facility by: [facility] Name of family member notified: daughter Patient and family notified of of transfer: 08/20/22  Discharge Plan and Services Additional resources added to the After Visit Summary for   In-house Referral: Clinical Social Work Discharge Planning Services: CM Consult Post Acute Care Choice: IP Rehab                               Social Determinants of Health (SDOH) Interventions SDOH Screenings   Food Insecurity: No Food Insecurity (07/23/2022)  Housing: Low Risk  (07/18/2022)  Transportation Needs: No Transportation Needs (07/23/2022)  Utilities: Not At Risk (07/18/2022)  Alcohol Screen: Low Risk  (05/12/2022)  Depression (PHQ2-9): Low Risk  (07/07/2022)  Recent Concern: Depression (PHQ2-9) - High Risk (05/12/2022)  Financial Resource Strain: Medium Risk (05/12/2022)  Physical  Activity: Inactive (05/12/2022)  Social Connections: Socially Integrated (05/12/2022)  Stress: No Stress Concern Present (05/12/2022)  Tobacco Use: Low Risk  (08/18/2022)     Readmission Risk Interventions    05/01/2022    3:54 PM  Readmission Risk Prevention Plan  Transportation Screening Complete  HRI or Home Care Consult Complete  Palliative Care Screening Not Applicable  Medication Review (RN Care Manager) Complete

## 2022-08-20 NOTE — Progress Notes (Signed)
Patient report given to the RN of [facility] ,all questions were answered

## 2022-08-20 NOTE — TOC Progression Note (Signed)
Transition of Care [facility]) - Progression Note    Patient Details  Name: Kathryn Beck MRN: [id number] Date of Birth: 31-Mar-1955  Transition of Care [facility]) CM/SW Contact  Eduard Roux, [place] Phone Number: 08/20/2022, 10:04 AM  Clinical Narrative:     Insurance remains pending   TOC will continue to follow and assist with discharge planning.   Expected Discharge Plan: Skilled Nursing Facility Barriers to Discharge: Continued Medical Work up  Expected Discharge Plan and Services In-house Referral: Clinical Social Work Discharge Planning Services: CM Consult Post Acute Care Choice: IP Rehab Living arrangements for the past 2 months: Single Family Home                                       Social Determinants of Health (SDOH) Interventions SDOH Screenings   Food Insecurity: No Food Insecurity (07/23/2022)  Housing: Low Risk  (07/18/2022)  Transportation Needs: No Transportation Needs (07/23/2022)  Utilities: Not At Risk (07/18/2022)  Alcohol Screen: Low Risk  (05/12/2022)  Depression (PHQ2-9): Low Risk  (07/07/2022)  Recent Concern: Depression (PHQ2-9) - High Risk (05/12/2022)  Financial Resource Strain: Medium Risk (05/12/2022)  Physical Activity: Inactive (05/12/2022)  Social Connections: Socially Integrated (05/12/2022)  Stress: No Stress Concern Present (05/12/2022)  Tobacco Use: Low Risk  (08/18/2022)    Readmission Risk Interventions    05/01/2022    3:54 PM  Readmission Risk Prevention Plan  Transportation Screening Complete  HRI or Home Care Consult Complete  Palliative Care Screening Not Applicable  Medication Review (RN Care Manager) Complete

## 2022-08-20 NOTE — Progress Notes (Addendum)
Advised by CSW of pt's likely d/c to snf today. Contacted [facility] and spoke to [place]. Clinic advised pt will d/c to snf today and should resume care tomorrow. Will fax d/c summary and last renal note to clinic for continuation of care once d/c summary is completed.   Olivia Canter Renal Navigator [phone]  Addendum at 3:47 pm: D/C summary and today's renal noted faxed to clinic for continuation of care.

## 2022-08-20 NOTE — Progress Notes (Signed)
Nutrition Follow-up  DOCUMENTATION CODES:   Not applicable  INTERVENTION:  D/c juven   Continue Nepro Shake po daily, each supplement provides 425 kcal and 19 grams protein  RD recommends Renal MVI after d/c  Recommend patient finds a lotion infused with Vitamin E for dry/flaky skin (Vitamin E lab came back slightly low)    NUTRITION DIAGNOSIS:   Inadequate oral intake related to nausea as evidenced by per patient/family report.   GOAL:   Patient will meet greater than or equal to 90% of their needs -ongoing   MONITOR:   PO intake, I & O's, Supplement acceptance, Labs, Weight trends, Skin  REASON FOR ASSESSMENT:   Consult Wound healing  ASSESSMENT:   67 y.o. female with PMHx including  ESRD on HD, HTN, T2DM, CVA, recent R BKA presents with acute osteomyelitis of toe on left foot and admitted for IV abx and likely transmetatarsal amputation on Saturday  Visited patient at bedside who reports she is feeling much better since surgery. Patient is s/p POD 5 transmetatarsal amputation. She has been compliant with drinking Nepro ONS but cannot tolerate the Juven.   Patient reports constipation and requesting miralax which is ordered PRN. RD messaged RN. She reports N/V sometimes after dialysis but know how to control it.   She reports improved appetite. Plans for SNF upon d/c.   Labs reviewed: Glu 147, BUN 38, Cr 7.11, vitamin E (alpha tocopherol) 7.8  Meds reviewed and include:  Wt: admit wt 186.95#, CBW 174.83# Patient has lost 12# since admission and  Po: 77% avg meal intake x last 4 documented meals  I/O's: +548.7 ml    Diet Order:   Diet Order             Diet renal/carb modified with fluid restriction Fluid restriction: 1200 mL Fluid; Room service appropriate? Yes; Fluid consistency: Thin  Diet effective now                   EDUCATION NEEDS:   Education needs have been addressed  Skin:  Skin Assessment: Skin Integrity Issues: Skin Integrity  Issues:: Diabetic Ulcer Diabetic Ulcer: L foot  Last BM:  5/29  Height:   Ht Readings from Last 1 Encounters:  08/15/22 5\' 4"  (1.626 m)    Weight:   Wt Readings from Last 1 Encounters:  08/19/22 79.3 kg    Ideal Body Weight:     BMI:  Body mass index is 30.01 kg/m.  Estimated Nutritional Needs:   Kcal:  1600-1800  Protein:  95-120 g  Fluid:  >1.6 L    Leodis Rains, RDN, LDN  Clinical Nutrition

## 2022-08-21 ENCOUNTER — Other Ambulatory Visit: Payer: Self-pay | Admitting: Orthopaedic Surgery

## 2022-08-21 DIAGNOSIS — M545 Low back pain, unspecified: Secondary | ICD-10-CM

## 2022-08-21 DIAGNOSIS — Z992 Dependence on renal dialysis: Secondary | ICD-10-CM | POA: Diagnosis not present

## 2022-08-21 DIAGNOSIS — N2581 Secondary hyperparathyroidism of renal origin: Secondary | ICD-10-CM | POA: Diagnosis not present

## 2022-08-21 DIAGNOSIS — R41841 Cognitive communication deficit: Secondary | ICD-10-CM | POA: Insufficient documentation

## 2022-08-21 DIAGNOSIS — N186 End stage renal disease: Secondary | ICD-10-CM | POA: Diagnosis not present

## 2022-08-21 DIAGNOSIS — R2689 Other abnormalities of gait and mobility: Secondary | ICD-10-CM | POA: Insufficient documentation

## 2022-08-21 LAB — VITAMIN C: Vitamin C: 0.3 mg/dL — ABNORMAL LOW (ref 0.4–2.0)

## 2022-08-22 ENCOUNTER — Other Ambulatory Visit: Payer: Medicare PPO

## 2022-08-24 ENCOUNTER — Other Ambulatory Visit: Payer: Self-pay | Admitting: Orthopedic Surgery

## 2022-08-24 DIAGNOSIS — N186 End stage renal disease: Secondary | ICD-10-CM | POA: Diagnosis not present

## 2022-08-24 DIAGNOSIS — N2581 Secondary hyperparathyroidism of renal origin: Secondary | ICD-10-CM | POA: Diagnosis not present

## 2022-08-24 DIAGNOSIS — Z992 Dependence on renal dialysis: Secondary | ICD-10-CM | POA: Diagnosis not present

## 2022-08-24 DIAGNOSIS — M545 Low back pain, unspecified: Secondary | ICD-10-CM

## 2022-08-26 DIAGNOSIS — Z992 Dependence on renal dialysis: Secondary | ICD-10-CM | POA: Diagnosis not present

## 2022-08-26 DIAGNOSIS — N2581 Secondary hyperparathyroidism of renal origin: Secondary | ICD-10-CM | POA: Diagnosis not present

## 2022-08-26 DIAGNOSIS — N186 End stage renal disease: Secondary | ICD-10-CM | POA: Diagnosis not present

## 2022-08-27 ENCOUNTER — Ambulatory Visit (INDEPENDENT_AMBULATORY_CARE_PROVIDER_SITE_OTHER): Payer: Medicare PPO | Admitting: Podiatry

## 2022-08-27 DIAGNOSIS — Z89432 Acquired absence of left foot: Secondary | ICD-10-CM

## 2022-08-27 MED ORDER — DOXYCYCLINE HYCLATE 100 MG PO TABS: 100.0000 mg | ORAL_TABLET | Freq: Two times a day (BID) | ORAL | 0 refills | Status: DC

## 2022-08-27 NOTE — Patient Instructions (Signed)
Please change daily with betadine to staple sites and gauze, gauze roll. Tape the gauze roll on. Do not apply tight dressing or ACE wrap

## 2022-08-28 DIAGNOSIS — N2581 Secondary hyperparathyroidism of renal origin: Secondary | ICD-10-CM | POA: Diagnosis not present

## 2022-08-28 DIAGNOSIS — N186 End stage renal disease: Secondary | ICD-10-CM | POA: Diagnosis not present

## 2022-08-28 DIAGNOSIS — Z992 Dependence on renal dialysis: Secondary | ICD-10-CM | POA: Diagnosis not present

## 2022-08-31 ENCOUNTER — Telehealth: Payer: Self-pay | Admitting: Orthopedic Surgery

## 2022-08-31 ENCOUNTER — Ambulatory Visit: Payer: Self-pay | Admitting: *Deleted

## 2022-08-31 ENCOUNTER — Encounter: Payer: Self-pay | Admitting: *Deleted

## 2022-08-31 DIAGNOSIS — N2581 Secondary hyperparathyroidism of renal origin: Secondary | ICD-10-CM | POA: Diagnosis not present

## 2022-08-31 DIAGNOSIS — Z992 Dependence on renal dialysis: Secondary | ICD-10-CM | POA: Diagnosis not present

## 2022-08-31 DIAGNOSIS — N186 End stage renal disease: Secondary | ICD-10-CM | POA: Diagnosis not present

## 2022-08-31 NOTE — Progress Notes (Addendum)
  Subjective:  Patient ID: Kathryn Beck, female    DOB: 13-Sep-1955,  MRN: [id number]  Chief Complaint  Patient presents with   Routine Post Op    hospital Post op # 1-: Left TRANSMETATARSAL AMPUTATION- dos 08/15/2022     67 y.o. female returns for post-op check.  She says she has not been on the foot, she is at the facility and they are changing the dressing daily  Review of Systems: Negative except as noted in the HPI. Denies N/V/F/Ch.   Objective:  There were no vitals filed for this visit. There is no height or weight on file to calculate BMI. Constitutional Well developed. Well nourished.  Vascular   Calf is soft and supple, no posterior calf or knee pain, negative Homans' sign  Neurologic Normal speech. Oriented to person, place, and time. Epicritic sensation to light touch grossly reduced    Dermatologic Distal edges of the plantar flap are cool to touch and cyanotic, there is serous drainage  Orthopedic: She has minimal pain to palpation    Assessment:   1. S/P transmetatarsal amputation of foot, left (HCC)    Plan:  Patient was evaluated and treated and all questions answered.  S/p foot surgery left -Unfortunately appears to have early necrosis of the distal flap.  My last examination prior to discharge she had good warmth and perfusion to the flap.  She has been compliant with weightbearing.  I do think they are putting tight dressings on there is an Ace wrap on the foot today, I advised the facility to cease putting any kind of compression dressings on the foot.  Would like to see her back in 1 week to reevaluate.  There are no acute signs of infection such as purulence or malodor but she did have some serous drainage from the incision I placed her on doxycycline  Return in about 1 week (around 09/03/2022) for post op (no x-rays).

## 2022-08-31 NOTE — Patient Instructions (Signed)
Visit Information  Thank you for taking time to visit with me today. Please don't hesitate to contact me if I can be of assistance to you.   Following are the goals we discussed today:   Goals Addressed             This Visit's Progress    Receive Assistance Applying for Aid & Attendance Benefits, through [employer].   On track    Care Coordination Interventions:  Interventions Today    Flowsheet Row Most Recent Value  Chronic Disease   Chronic disease during today's visit Diabetes, Hypertension (HTN), Chronic Kidney Disease/End Stage Renal Disease (ESRD), Congestive Heart Failure (CHF), Other  [Neuropathy, Chronic Pain Syndrome, Anxiety, Depression, Memory Loss, Neurocognitive Disorder & Requires Maximum Assistance with Activities of Daily Living]  General Interventions   General Interventions Discussed/Reviewed General Interventions Reviewed, Annual Eye Exam, General Interventions Discussed, Labs, Annual Foot Exam, Lipid Profile, Durable Medical Equipment (DME), Vaccines, Health Screening, [employer], Doctor Visits, Communication with, Level of Care  [Primary Care Provider]  Labs Hgb A1c every 3 months, Kidney Function  Vaccines COVID-19, Flu, Pneumonia, RSV, Shingles, Tetanus/Pertussis/Diphtheria  [Encouraged]  Doctor Visits Discussed/Reviewed Doctor Visits Discussed, Doctor Visits Reviewed, Annual Wellness Visits, PCP, Specialist  [Encouraged]  Health Screening Colonoscopy, Mammogram  [Encouraged]  Durable Medical Equipment (DME) BP Cuff, Glucomoter, Environmental consultant, Wheelchair, Physicist, medical  PCP/Specialist Visits Compliance with follow-up visit  Communication with PCP/Specialists, RN  Level of Care Adult Daycare, Air traffic controller, Assisted Living, Skilled Nursing Facility, Teaching laboratory technician Medicaid, Personal Care Services  Exercise Interventions   Exercise Discussed/Reviewed Exercise Discussed, Exercise Reviewed, Physical  Activity, Assistive device use and maintanence  Physical Activity Discussed/Reviewed Physical Activity Discussed, Physical Activity Reviewed, Types of exercise, Home Exercise Program (HEP)  [Encouraged]  Education Interventions   Education Provided Provided Therapist, sports, Provided Web-based Education, Provided Education  Provided Verbal Education On Nutrition, Foot Care, Eye Care, Labs, Blood Sugar Monitoring, Mental Health/Coping with Illness, Applications, Exercise, Medication, When to see the doctor, [employer], Human resources officer, Personal Care Services  Mental Health Interventions   Mental Health Discussed/Reviewed Mental Health Discussed, Mental Health Reviewed, Coping Strategies, Crisis, Anxiety, Depression, Grief and Loss, Substance Abuse, Suicide  Nutrition Interventions   Nutrition Discussed/Reviewed Nutrition Discussed, Nutrition Reviewed, Adding fruits and vegetables, Fluid intake, Decreasing sugar intake, Increaing proteins, Decreasing fats, Decreasing salt  Pharmacy Interventions   Pharmacy Dicussed/Reviewed Pharmacy Topics Discussed, Pharmacy Topics Reviewed, Medication Adherence, Affording Medications, Referral to Pharmacist  Medication Adherence Unable to refill medication  Referral to Pharmacist Cannot afford medications  Safety Interventions   Safety Discussed/Reviewed Safety Discussed, Safety Reviewed, Fall Risk, Home Safety  Home Safety Assistive Devices, Need for home safety assessment, Refer for home visit, Contact provider for referral to PT/OT, Refer for community resources, Contact home health agency  Advanced Directive Interventions   Advanced Directives Discussed/Reviewed Advanced Directives Discussed, Advanced Directives Reviewed, Advanced Care Planning, Provided resource for acquiring and filling out documents     Active Listening & Reflection Utilized.  Emotional Support Provided. Verbalization of Feelings Encouraged.  Feelings  of Motivation Acknowledged. Relief from Pain Symptoms Validated. Solution-Focused Strategies Implemented. Problem Solving Interventions Employed. Task-Centered Solutions Activated.  Cognitive Behavioral Therapy Initiated.  Client-Centered Therapy Performed. Acceptance & Commitment Therapy Indicated. CSW Collaboration with Daughter, [name] to Confirm Current Place of Residence at [facility] [phone]), to Receive Short-Term Rehabilitative Services, As of 08/20/2022. CSW Collaboration with Daughter, [name]  Julien Girt to [employer] with [facility] [phone]), to Schedule Appointment to Receive Assistance with Review & Submission of Completed Application for Aid & Attendance Benefits. CSW Collaboration with Daughter, [name] to [employer] with CSW [phone]), if She Has Questions, Needs Assistance, or If Additional Social Work Needs Are Identified Between Now & Our Next Scheduled Follow-Up Outreach Call.      Our next appointment is by telephone on 09/14/2022 at 9:45 am.  Please call the care guide team at [phone] if you need to cancel or reschedule your appointment.   If you are experiencing a Mental Health or Behavioral Health Crisis or need someone to talk to, please call the Suicide and Crisis Lifeline: 988 call the [place] National Suicide Prevention Lifeline: 423-084-6943 or TTY: 403 140 3499 TTY (820)481-4488) to talk to a trained counselor call 1-800-273-TALK (toll free, 24 hour hotline) go to [facility] [address], [place] [phone]) call the [facility] Crisis Line: (850) 784-4752 call 911  Patient verbalizes understanding of instructions and care plan provided today and agrees to view in MyChart. Active MyChart status and patient understanding of how to access instructions and care plan via MyChart confirmed with  patient.     Telephone follow up appointment with care management team member scheduled for:  09/14/2022 at 9:45 am.  Danford Bad, BSW, MSW, LCSW  Licensed Clinical Social Worker  [employer] [facility]  Mailing [place]. [address] [place], [place] [address] Physical Address-[address], [place], [place] [address] Toll Free Main # [phone] Fax # [phone] Cell # [phone] [email or web address]

## 2022-08-31 NOTE — Telephone Encounter (Signed)
Looks like this was written 08/27/2022 by PCP? #20 would be a stop date 10 days after starting (until complete) I will call to advise.

## 2022-08-31 NOTE — Patient Outreach (Signed)
Care Coordination   Follow Up Visit Note   08/31/2022  Name: Kathryn Beck MRN: [id number] DOB: 12-08-1955  Kathryn Beck is a 66 y.o. year old female who sees [name], MD for primary care. I spoke with patient's daughter, [name] by phone today.  What matters to the patients health and wellness today?  Receive Assistance Applying for Aid & Attendance Benefits, through [employer].   Goals Addressed             This Visit's Progress    Receive Assistance Applying for Aid & Attendance Benefits, through [employer].   On track    Care Coordination Interventions:  Interventions Today    Flowsheet Row Most Recent Value  Chronic Disease   Chronic disease during today's visit Diabetes, Hypertension (HTN), Chronic Kidney Disease/End Stage Renal Disease (ESRD), Congestive Heart Failure (CHF), Other  [Neuropathy, Chronic Pain Syndrome, Anxiety, Depression, Memory Loss, Neurocognitive Disorder & Requires Maximum Assistance with Activities of Daily Living]  General Interventions   General Interventions Discussed/Reviewed General Interventions Reviewed, Annual Eye Exam, General Interventions Discussed, Labs, Annual Foot Exam, Lipid Profile, Durable Medical Equipment (DME), Vaccines, Health Screening, [employer], Doctor Visits, Communication with, Level of Care  [Primary Care Provider]  Labs Hgb A1c every 3 months, Kidney Function  Vaccines COVID-19, Flu, Pneumonia, RSV, Shingles, Tetanus/Pertussis/Diphtheria  [Encouraged]  Doctor Visits Discussed/Reviewed Doctor Visits Discussed, Doctor Visits Reviewed, Annual Wellness Visits, PCP, Specialist  [Encouraged]  Health Screening Colonoscopy, Mammogram  [Encouraged]  Durable Medical Equipment (DME) BP Cuff, Glucomoter, Environmental consultant, Wheelchair, Physicist, medical  PCP/Specialist Visits Compliance with follow-up visit  Communication with PCP/Specialists, RN  Level of Care Adult  Daycare, Air traffic controller, Assisted Living, Skilled Nursing Facility, Teaching laboratory technician Medicaid, Personal Care Services  Exercise Interventions   Exercise Discussed/Reviewed Exercise Discussed, Exercise Reviewed, Physical Activity, Assistive device use and maintanence  Physical Activity Discussed/Reviewed Physical Activity Discussed, Physical Activity Reviewed, Types of exercise, Home Exercise Program (HEP)  [Encouraged]  Education Interventions   Education Provided Provided Therapist, sports, Provided Web-based Education, Provided Education  Provided Verbal Education On Nutrition, Foot Care, Eye Care, Labs, Blood Sugar Monitoring, Mental Health/Coping with Illness, Applications, Exercise, Medication, When to see the doctor, [employer], Human resources officer, Personal Care Services  Mental Health Interventions   Mental Health Discussed/Reviewed Mental Health Discussed, Mental Health Reviewed, Coping Strategies, Crisis, Anxiety, Depression, Grief and Loss, Substance Abuse, Suicide  Nutrition Interventions   Nutrition Discussed/Reviewed Nutrition Discussed, Nutrition Reviewed, Adding fruits and vegetables, Fluid intake, Decreasing sugar intake, Increaing proteins, Decreasing fats, Decreasing salt  Pharmacy Interventions   Pharmacy Dicussed/Reviewed Pharmacy Topics Discussed, Pharmacy Topics Reviewed, Medication Adherence, Affording Medications, Referral to Pharmacist  Medication Adherence Unable to refill medication  Referral to Pharmacist Cannot afford medications  Safety Interventions   Safety Discussed/Reviewed Safety Discussed, Safety Reviewed, Fall Risk, Home Safety  Home Safety Assistive Devices, Need for home safety assessment, Refer for home visit, Contact provider for referral to PT/OT, Refer for community resources, Contact home health agency  Advanced Directive Interventions   Advanced Directives Discussed/Reviewed Advanced Directives Discussed,  Advanced Directives Reviewed, Advanced Care Planning, Provided resource for acquiring and filling out documents     Active Listening & Reflection Utilized.  Emotional Support Provided. Verbalization of Feelings Encouraged.  Feelings of Motivation Acknowledged. Relief from Pain Symptoms Validated. Solution-Focused Strategies Implemented. Problem Solving Interventions Employed. Task-Centered Solutions Activated.  Cognitive Behavioral Therapy Initiated.  Client-Centered Therapy Performed. Acceptance & Commitment Therapy  Indicated. CSW Collaboration with Daughter, [name] to Confirm Current Place of Residence at [facility] [phone]), to Receive Short-Term Rehabilitative Services, As of 08/20/2022. CSW Collaboration with Daughter, [name] to Encourage Contact with [facility] [phone]), to Schedule Appointment to Receive Assistance with Review & Submission of Completed Application for Aid & Attendance Benefits. CSW Collaboration with Daughter, [name] to [employer] with CSW [phone]), if She Has Questions, Needs Assistance, or If Additional Social Work Needs Are Identified Between Now & Our Next Scheduled Follow-Up Outreach Call.      SDOH assessments and interventions completed:  Yes.  Care Coordination Interventions:  Yes, provided.   Follow up plan: Follow up call scheduled for 09/14/2022 at 9:45 am.  Encounter Outcome:  Pt. Visit Completed.   Danford Bad, BSW, MSW, LCSW  Licensed Restaurant manager, [employer] [facility]  Mailing [place] [address], [place], [place] [address] Physical Address-[address], [place], [place] [address] Toll Free Main # [phone] Fax # [phone] Cell # [phone] [email or web address]

## 2022-08-31 NOTE — Telephone Encounter (Signed)
Kathryn Beck from [facility] called. Says there is no stop date on the doxycycline. [phone] [facility]

## 2022-08-31 NOTE — Telephone Encounter (Signed)
Actually rx was given by podiatry. Tried to return call and there was no answer at the nursing station. No ability to leave a message I will hold and try again tomorrow.

## 2022-09-01 ENCOUNTER — Inpatient Hospital Stay: Payer: Medicare PPO | Admitting: Internal Medicine

## 2022-09-02 ENCOUNTER — Telehealth: Payer: Self-pay | Admitting: Radiology

## 2022-09-02 DIAGNOSIS — N186 End stage renal disease: Secondary | ICD-10-CM | POA: Diagnosis not present

## 2022-09-02 DIAGNOSIS — Z992 Dependence on renal dialysis: Secondary | ICD-10-CM | POA: Diagnosis not present

## 2022-09-02 DIAGNOSIS — N2581 Secondary hyperparathyroidism of renal origin: Secondary | ICD-10-CM | POA: Diagnosis not present

## 2022-09-02 NOTE — Telephone Encounter (Signed)
Noted  

## 2022-09-02 NOTE — Telephone Encounter (Signed)
FYI  Received call from [place] with [facility] stating patient has follow up scheduled for September, however, her incision is beginning to dehisce and they need to get her in ASAP. She is having drainage.  She confirmed this is BKA incision.  Patient is in dialysis today and appt is requested for tomorrow if possible. I worked in to schedule at [employer].

## 2022-09-02 NOTE — Telephone Encounter (Signed)
Call came back in from [facility]. It is not the BKA side that is the concern. It is patient's opposite leg which was operated on by someone else. Appointment cancelled and patient will follow up in September as previously scheduled.

## 2022-09-03 ENCOUNTER — Ambulatory Visit: Payer: Medicare PPO | Admitting: Orthopedic Surgery

## 2022-09-03 ENCOUNTER — Emergency Department (HOSPITAL_COMMUNITY): Payer: Medicare PPO

## 2022-09-03 ENCOUNTER — Ambulatory Visit: Payer: Medicare PPO | Admitting: Podiatry

## 2022-09-03 ENCOUNTER — Inpatient Hospital Stay (HOSPITAL_COMMUNITY)
Admission: EM | Admit: 2022-09-03 | Discharge: 2022-09-08 | DRG: 856 | Disposition: A | Discharge status: Skilled Nursing Facility | Payer: Medicare PPO | Attending: Internal Medicine | Admitting: Internal Medicine

## 2022-09-03 ENCOUNTER — Encounter (HOSPITAL_COMMUNITY): Payer: Self-pay

## 2022-09-03 ENCOUNTER — Inpatient Hospital Stay (HOSPITAL_COMMUNITY): Payer: Medicare PPO

## 2022-09-03 ENCOUNTER — Other Ambulatory Visit: Payer: Self-pay

## 2022-09-03 VITALS — BP 159/64 | HR 61 | Temp 97.9°F

## 2022-09-03 DIAGNOSIS — E1169 Type 2 diabetes mellitus with other specified complication: Secondary | ICD-10-CM | POA: Diagnosis not present

## 2022-09-03 DIAGNOSIS — T8142XA Infection following a procedure, deep incisional surgical site, initial encounter: Secondary | ICD-10-CM | POA: Diagnosis not present

## 2022-09-03 DIAGNOSIS — N186 End stage renal disease: Secondary | ICD-10-CM

## 2022-09-03 DIAGNOSIS — Z8249 Family history of ischemic heart disease and other diseases of the circulatory system: Secondary | ICD-10-CM

## 2022-09-03 DIAGNOSIS — Z89511 Acquired absence of right leg below knee: Secondary | ICD-10-CM

## 2022-09-03 DIAGNOSIS — F32A Depression, unspecified: Secondary | ICD-10-CM | POA: Diagnosis not present

## 2022-09-03 DIAGNOSIS — S98922A Partial traumatic amputation of left foot, level unspecified, initial encounter: Secondary | ICD-10-CM | POA: Diagnosis not present

## 2022-09-03 DIAGNOSIS — R197 Diarrhea, unspecified: Secondary | ICD-10-CM | POA: Diagnosis present

## 2022-09-03 DIAGNOSIS — E1122 Type 2 diabetes mellitus with diabetic chronic kidney disease: Secondary | ICD-10-CM | POA: Diagnosis present

## 2022-09-03 DIAGNOSIS — S91302A Unspecified open wound, left foot, initial encounter: Secondary | ICD-10-CM | POA: Diagnosis not present

## 2022-09-03 DIAGNOSIS — E782 Mixed hyperlipidemia: Secondary | ICD-10-CM | POA: Diagnosis present

## 2022-09-03 DIAGNOSIS — E1151 Type 2 diabetes mellitus with diabetic peripheral angiopathy without gangrene: Secondary | ICD-10-CM | POA: Diagnosis present

## 2022-09-03 DIAGNOSIS — R531 Weakness: Secondary | ICD-10-CM | POA: Diagnosis not present

## 2022-09-03 DIAGNOSIS — T8781 Dehiscence of amputation stump: Secondary | ICD-10-CM | POA: Diagnosis not present

## 2022-09-03 DIAGNOSIS — M86272 Subacute osteomyelitis, left ankle and foot: Secondary | ICD-10-CM | POA: Diagnosis present

## 2022-09-03 DIAGNOSIS — S88912A Complete traumatic amputation of left lower leg, level unspecified, initial encounter: Secondary | ICD-10-CM

## 2022-09-03 DIAGNOSIS — I10 Essential (primary) hypertension: Secondary | ICD-10-CM | POA: Diagnosis not present

## 2022-09-03 DIAGNOSIS — E88819 Insulin resistance, unspecified: Secondary | ICD-10-CM | POA: Diagnosis not present

## 2022-09-03 DIAGNOSIS — Z8701 Personal history of pneumonia (recurrent): Secondary | ICD-10-CM

## 2022-09-03 DIAGNOSIS — D509 Iron deficiency anemia, unspecified: Secondary | ICD-10-CM | POA: Diagnosis present

## 2022-09-03 DIAGNOSIS — I120 Hypertensive chronic kidney disease with stage 5 chronic kidney disease or end stage renal disease: Secondary | ICD-10-CM | POA: Diagnosis not present

## 2022-09-03 DIAGNOSIS — N2581 Secondary hyperparathyroidism of renal origin: Secondary | ICD-10-CM | POA: Diagnosis present

## 2022-09-03 DIAGNOSIS — D649 Anemia, unspecified: Secondary | ICD-10-CM | POA: Diagnosis present

## 2022-09-03 DIAGNOSIS — K219 Gastro-esophageal reflux disease without esophagitis: Secondary | ICD-10-CM | POA: Diagnosis present

## 2022-09-03 DIAGNOSIS — Z9889 Other specified postprocedural states: Secondary | ICD-10-CM

## 2022-09-03 DIAGNOSIS — M62521 Muscle wasting and atrophy, not elsewhere classified, right upper arm: Secondary | ICD-10-CM | POA: Diagnosis not present

## 2022-09-03 DIAGNOSIS — I739 Peripheral vascular disease, unspecified: Secondary | ICD-10-CM | POA: Diagnosis not present

## 2022-09-03 DIAGNOSIS — Z4781 Encounter for orthopedic aftercare following surgical amputation: Secondary | ICD-10-CM | POA: Diagnosis not present

## 2022-09-03 DIAGNOSIS — Z880 Allergy status to penicillin: Secondary | ICD-10-CM

## 2022-09-03 DIAGNOSIS — L089 Local infection of the skin and subcutaneous tissue, unspecified: Secondary | ICD-10-CM

## 2022-09-03 DIAGNOSIS — Z91199 Patient's noncompliance with other medical treatment and regimen due to unspecified reason: Secondary | ICD-10-CM

## 2022-09-03 DIAGNOSIS — M869 Osteomyelitis, unspecified: Secondary | ICD-10-CM | POA: Diagnosis present

## 2022-09-03 DIAGNOSIS — N184 Chronic kidney disease, stage 4 (severe): Secondary | ICD-10-CM | POA: Diagnosis present

## 2022-09-03 DIAGNOSIS — Z992 Dependence on renal dialysis: Secondary | ICD-10-CM

## 2022-09-03 DIAGNOSIS — F40240 Claustrophobia: Secondary | ICD-10-CM | POA: Diagnosis present

## 2022-09-03 DIAGNOSIS — Z8261 Family history of arthritis: Secondary | ICD-10-CM

## 2022-09-03 DIAGNOSIS — T8149XA Infection following a procedure, other surgical site, initial encounter: Secondary | ICD-10-CM | POA: Diagnosis not present

## 2022-09-03 DIAGNOSIS — Y835 Amputation of limb(s) as the cause of abnormal reaction of the patient, or of later complication, without mention of misadventure at the time of the procedure: Secondary | ICD-10-CM | POA: Diagnosis present

## 2022-09-03 DIAGNOSIS — R2689 Other abnormalities of gait and mobility: Secondary | ICD-10-CM | POA: Diagnosis not present

## 2022-09-03 DIAGNOSIS — E8809 Other disorders of plasma-protein metabolism, not elsewhere classified: Secondary | ICD-10-CM | POA: Diagnosis not present

## 2022-09-03 DIAGNOSIS — L0889 Other specified local infections of the skin and subcutaneous tissue: Secondary | ICD-10-CM | POA: Diagnosis present

## 2022-09-03 DIAGNOSIS — E1140 Type 2 diabetes mellitus with diabetic neuropathy, unspecified: Secondary | ICD-10-CM | POA: Diagnosis present

## 2022-09-03 DIAGNOSIS — Z89432 Acquired absence of left foot: Secondary | ICD-10-CM

## 2022-09-03 DIAGNOSIS — Z803 Family history of malignant neoplasm of breast: Secondary | ICD-10-CM

## 2022-09-03 DIAGNOSIS — Z8616 Personal history of COVID-19: Secondary | ICD-10-CM

## 2022-09-03 DIAGNOSIS — M1990 Unspecified osteoarthritis, unspecified site: Secondary | ICD-10-CM | POA: Diagnosis present

## 2022-09-03 DIAGNOSIS — T8149XD Infection following a procedure, other surgical site, subsequent encounter: Secondary | ICD-10-CM | POA: Diagnosis not present

## 2022-09-03 DIAGNOSIS — M6281 Muscle weakness (generalized): Secondary | ICD-10-CM | POA: Diagnosis not present

## 2022-09-03 DIAGNOSIS — Z7982 Long term (current) use of aspirin: Secondary | ICD-10-CM

## 2022-09-03 DIAGNOSIS — M7989 Other specified soft tissue disorders: Secondary | ICD-10-CM | POA: Diagnosis not present

## 2022-09-03 DIAGNOSIS — Z743 Need for continuous supervision: Secondary | ICD-10-CM | POA: Diagnosis not present

## 2022-09-03 DIAGNOSIS — E1165 Type 2 diabetes mellitus with hyperglycemia: Secondary | ICD-10-CM

## 2022-09-03 DIAGNOSIS — Z7902 Long term (current) use of antithrombotics/antiplatelets: Secondary | ICD-10-CM

## 2022-09-03 DIAGNOSIS — G894 Chronic pain syndrome: Secondary | ICD-10-CM | POA: Diagnosis not present

## 2022-09-03 DIAGNOSIS — E669 Obesity, unspecified: Secondary | ICD-10-CM | POA: Diagnosis present

## 2022-09-03 DIAGNOSIS — M86172 Other acute osteomyelitis, left ankle and foot: Secondary | ICD-10-CM | POA: Diagnosis present

## 2022-09-03 DIAGNOSIS — Z8673 Personal history of transient ischemic attack (TIA), and cerebral infarction without residual deficits: Secondary | ICD-10-CM

## 2022-09-03 DIAGNOSIS — D638 Anemia in other chronic diseases classified elsewhere: Secondary | ICD-10-CM | POA: Diagnosis present

## 2022-09-03 DIAGNOSIS — M62562 Muscle wasting and atrophy, not elsewhere classified, left lower leg: Secondary | ICD-10-CM | POA: Diagnosis not present

## 2022-09-03 DIAGNOSIS — Z89422 Acquired absence of other left toe(s): Secondary | ICD-10-CM | POA: Diagnosis not present

## 2022-09-03 DIAGNOSIS — M86672 Other chronic osteomyelitis, left ankle and foot: Secondary | ICD-10-CM | POA: Diagnosis not present

## 2022-09-03 DIAGNOSIS — N250 Renal osteodystrophy: Secondary | ICD-10-CM | POA: Diagnosis not present

## 2022-09-03 DIAGNOSIS — T8744 Infection of amputation stump, left lower extremity: Secondary | ICD-10-CM | POA: Diagnosis not present

## 2022-09-03 DIAGNOSIS — Z90710 Acquired absence of both cervix and uterus: Secondary | ICD-10-CM

## 2022-09-03 DIAGNOSIS — Z79899 Other long term (current) drug therapy: Secondary | ICD-10-CM | POA: Diagnosis not present

## 2022-09-03 DIAGNOSIS — M868X7 Other osteomyelitis, ankle and foot: Secondary | ICD-10-CM | POA: Diagnosis not present

## 2022-09-03 DIAGNOSIS — D631 Anemia in chronic kidney disease: Secondary | ICD-10-CM | POA: Diagnosis not present

## 2022-09-03 DIAGNOSIS — R41841 Cognitive communication deficit: Secondary | ICD-10-CM | POA: Diagnosis not present

## 2022-09-03 DIAGNOSIS — Z888 Allergy status to other drugs, medicaments and biological substances status: Secondary | ICD-10-CM

## 2022-09-03 DIAGNOSIS — M62522 Muscle wasting and atrophy, not elsewhere classified, left upper arm: Secondary | ICD-10-CM | POA: Diagnosis not present

## 2022-09-03 DIAGNOSIS — Z83438 Family history of other disorder of lipoprotein metabolism and other lipidemia: Secondary | ICD-10-CM

## 2022-09-03 DIAGNOSIS — Z6830 Body mass index (BMI) 30.0-30.9, adult: Secondary | ICD-10-CM

## 2022-09-03 DIAGNOSIS — Z7984 Long term (current) use of oral hypoglycemic drugs: Secondary | ICD-10-CM

## 2022-09-03 DIAGNOSIS — Z794 Long term (current) use of insulin: Secondary | ICD-10-CM

## 2022-09-03 LAB — COMPREHENSIVE METABOLIC PANEL
ALT: 24 U/L (ref 0–44)
AST: 17 U/L (ref 15–41)
Albumin: 3.4 g/dL — ABNORMAL LOW (ref 3.5–5.0)
Alkaline Phosphatase: 96 U/L (ref 38–126)
Anion gap: 15 (ref 5–15)
BUN: 36 mg/dL — ABNORMAL HIGH (ref 8–23)
CO2: 24 mmol/L (ref 22–32)
Calcium: 8.6 mg/dL — ABNORMAL LOW (ref 8.9–10.3)
Chloride: 101 mmol/L (ref 98–111)
Creatinine, Ser: 5.13 mg/dL — ABNORMAL HIGH (ref 0.44–1.00)
GFR, Estimated: 9 mL/min — ABNORMAL LOW (ref >60)
Glucose, Bld: 164 mg/dL — ABNORMAL HIGH (ref 70–99)
Potassium: 3.7 mmol/L (ref 3.5–5.1)
Sodium: 140 mmol/L (ref 135–145)
Total Bilirubin: 0.4 mg/dL (ref 0.3–1.2)
Total Protein: 7.6 g/dL (ref 6.5–8.1)

## 2022-09-03 LAB — SEDIMENTATION RATE: Sed Rate: 70 mm/hr — ABNORMAL HIGH (ref 0–22)

## 2022-09-03 LAB — LACTIC ACID, PLASMA
Lactic Acid, Venous: 1.8 mmol/L (ref 0.5–1.9)
Lactic Acid, Venous: 1.5 mmol/L (ref 0.5–1.9)

## 2022-09-03 LAB — CBC WITH DIFFERENTIAL/PLATELET
Abs Immature Granulocytes: 0.04 10*3/uL (ref 0.00–0.07)
Basophils Absolute: 0 10*3/uL (ref 0.0–0.1)
Basophils Relative: 0 %
Eosinophils Absolute: 0.1 10*3/uL (ref 0.0–0.5)
Eosinophils Relative: 1 %
HCT: 29.8 % — ABNORMAL LOW (ref 36.0–46.0)
Hemoglobin: 8.9 g/dL — ABNORMAL LOW (ref 12.0–15.0)
Immature Granulocytes: 0 %
Lymphocytes Relative: 20 %
Lymphs Abs: 1.9 10*3/uL (ref 0.7–4.0)
MCH: 24.7 pg — ABNORMAL LOW (ref 26.0–34.0)
MCHC: 29.9 g/dL — ABNORMAL LOW (ref 30.0–36.0)
MCV: 82.5 fL (ref 80.0–100.0)
Monocytes Absolute: 0.7 10*3/uL (ref 0.1–1.0)
Monocytes Relative: 7 %
Neutro Abs: 6.8 10*3/uL (ref 1.7–7.7)
Neutrophils Relative %: 72 %
Platelets: 354 10*3/uL (ref 150–400)
RBC: 3.61 MIL/uL — ABNORMAL LOW (ref 3.87–5.11)
RDW: 18.4 % — ABNORMAL HIGH (ref 11.5–15.5)
WBC: 9.5 10*3/uL (ref 4.0–10.5)
nRBC: 0.2 % (ref 0.0–0.2)

## 2022-09-03 LAB — C-REACTIVE PROTEIN: CRP: 0.9 mg/dL (ref <1.0)

## 2022-09-03 LAB — GLUCOSE, CAPILLARY
Glucose-Capillary: 201 mg/dL — ABNORMAL HIGH (ref 70–99)
Glucose-Capillary: 287 mg/dL — ABNORMAL HIGH (ref 70–99)

## 2022-09-03 MED ORDER — INSULIN ASPART 100 UNIT/ML IJ SOLN
0.0000 [IU] | Freq: Three times a day (TID) | INTRAMUSCULAR | Status: DC
  Administered 2022-09-03: 3 [IU] via SUBCUTANEOUS
  Administered 2022-09-04: 1 [IU] via SUBCUTANEOUS
  Administered 2022-09-05: 3 [IU] via SUBCUTANEOUS
  Administered 2022-09-05: 2 [IU] via SUBCUTANEOUS
  Administered 2022-09-05: 5 [IU] via SUBCUTANEOUS
  Administered 2022-09-06: 7 [IU] via SUBCUTANEOUS
  Administered 2022-09-06: 2 [IU] via SUBCUTANEOUS
  Administered 2022-09-06: 7 [IU] via SUBCUTANEOUS
  Administered 2022-09-07: 3 [IU] via SUBCUTANEOUS
  Administered 2022-09-07: 1 [IU] via SUBCUTANEOUS
  Administered 2022-09-08: 2 [IU] via SUBCUTANEOUS
  Administered 2022-09-08: 5 [IU] via SUBCUTANEOUS

## 2022-09-03 MED ORDER — METRONIDAZOLE 500 MG PO TABS
500.0000 mg | ORAL_TABLET | Freq: Two times a day (BID) | ORAL | Status: DC
  Administered 2022-09-03 – 2022-09-08 (×9): 500 mg via ORAL
  Filled 2022-09-03 (×11): qty 1

## 2022-09-03 MED ORDER — METOPROLOL SUCCINATE ER 50 MG PO TB24
50.0000 mg | ORAL_TABLET | Freq: Every day | ORAL | Status: DC
  Administered 2022-09-05 – 2022-09-08 (×4): 50 mg via ORAL
  Filled 2022-09-03 (×4): qty 1

## 2022-09-03 MED ORDER — SUCROFERRIC OXYHYDROXIDE 500 MG PO CHEW
1000.0000 mg | CHEWABLE_TABLET | Freq: Three times a day (TID) | ORAL | Status: DC
  Administered 2022-09-03 – 2022-09-08 (×12): 1000 mg via ORAL
  Filled 2022-09-03 (×13): qty 2

## 2022-09-03 MED ORDER — FUROSEMIDE 40 MG PO TABS
80.0000 mg | ORAL_TABLET | Freq: Two times a day (BID) | ORAL | Status: DC
  Administered 2022-09-04 – 2022-09-08 (×8): 80 mg via ORAL
  Filled 2022-09-03 (×8): qty 2

## 2022-09-03 MED ORDER — CHLORHEXIDINE GLUCONATE CLOTH 2 % EX PADS
6.0000 | MEDICATED_PAD | Freq: Every day | CUTANEOUS | Status: DC
  Administered 2022-09-04 – 2022-09-08 (×4): 6 via TOPICAL

## 2022-09-03 MED ORDER — PREGABALIN 25 MG PO CAPS
50.0000 mg | ORAL_CAPSULE | Freq: Every day | ORAL | Status: DC
  Administered 2022-09-05 – 2022-09-08 (×4): 50 mg via ORAL
  Filled 2022-09-03 (×4): qty 2

## 2022-09-03 MED ORDER — VANCOMYCIN HCL 750 MG/150ML IV SOLN
750.0000 mg | INTRAVENOUS | Status: DC
  Administered 2022-09-07: 750 mg via INTRAVENOUS
  Filled 2022-09-03: qty 150

## 2022-09-03 MED ORDER — INSULIN GLARGINE-YFGN 100 UNIT/ML ~~LOC~~ SOLN
15.0000 [IU] | Freq: Every day | SUBCUTANEOUS | Status: DC
  Administered 2022-09-05 – 2022-09-07 (×3): 15 [IU] via SUBCUTANEOUS
  Filled 2022-09-03 (×4): qty 0.15

## 2022-09-03 MED ORDER — METRONIDAZOLE 500 MG PO TABS: 500.0000 mg | ORAL_TABLET | Freq: Two times a day (BID) | ORAL | Status: DC

## 2022-09-03 MED ORDER — SODIUM CHLORIDE 0.9 % IV SOLN
2.0000 g | Freq: Once | INTRAVENOUS | Status: AC
  Administered 2022-09-03: 2 g via INTRAVENOUS
  Filled 2022-09-03: qty 12.5

## 2022-09-03 MED ORDER — INSULIN ASPART 100 UNIT/ML IJ SOLN
0.0000 [IU] | Freq: Every day | INTRAMUSCULAR | Status: DC
  Administered 2022-09-03: 3 [IU] via SUBCUTANEOUS
  Administered 2022-09-04: 4 [IU] via SUBCUTANEOUS
  Administered 2022-09-05: 3 [IU] via SUBCUTANEOUS
  Administered 2022-09-06: 4 [IU] via SUBCUTANEOUS
  Administered 2022-09-07: 3 [IU] via SUBCUTANEOUS

## 2022-09-03 MED ORDER — ASPIRIN 81 MG PO CHEW
81.0000 mg | CHEWABLE_TABLET | Freq: Every day | ORAL | Status: DC
  Administered 2022-09-05 – 2022-09-08 (×4): 81 mg via ORAL
  Filled 2022-09-03 (×4): qty 1

## 2022-09-03 MED ORDER — EZETIMIBE 10 MG PO TABS
10.0000 mg | ORAL_TABLET | Freq: Every day | ORAL | Status: DC
  Administered 2022-09-05 – 2022-09-08 (×4): 10 mg via ORAL
  Filled 2022-09-03 (×4): qty 1

## 2022-09-03 MED ORDER — KETOTIFEN FUMARATE 0.035 % OP SOLN
1.0000 [drp] | Freq: Two times a day (BID) | OPHTHALMIC | Status: DC
  Administered 2022-09-03 – 2022-09-07 (×5): 1 [drp] via OPHTHALMIC
  Filled 2022-09-03: qty 5

## 2022-09-03 MED ORDER — SUCROFERRIC OXYHYDROXIDE 500 MG PO CHEW
500.0000 mg | CHEWABLE_TABLET | ORAL | Status: DC
  Administered 2022-09-03 – 2022-09-07 (×5): 500 mg via ORAL
  Filled 2022-09-03 (×6): qty 1

## 2022-09-03 MED ORDER — INSULIN GLARGINE (2 UNIT DIAL) 300 UNIT/ML ~~LOC~~ SOPN: 15.0000 [IU] | PEN_INJECTOR | Freq: Every day | SUBCUTANEOUS | Status: DC

## 2022-09-03 MED ORDER — HEPARIN SODIUM (PORCINE) 5000 UNIT/ML IJ SOLN
5000.0000 [IU] | Freq: Two times a day (BID) | INTRAMUSCULAR | Status: DC
  Administered 2022-09-03 – 2022-09-08 (×9): 5000 [IU] via SUBCUTANEOUS
  Filled 2022-09-03 (×9): qty 1

## 2022-09-03 MED ORDER — SUCROFERRIC OXYHYDROXIDE 500 MG PO CHEW: 500.0000 mg | CHEWABLE_TABLET | ORAL | Status: DC

## 2022-09-03 MED ORDER — OXYCODONE-ACETAMINOPHEN 5-325 MG PO TABS
1.0000 | ORAL_TABLET | ORAL | Status: DC | PRN
  Administered 2022-09-04 – 2022-09-06 (×6): 1 via ORAL
  Filled 2022-09-03 (×6): qty 1

## 2022-09-03 MED ORDER — DULOXETINE HCL 60 MG PO CPEP
60.0000 mg | ORAL_CAPSULE | Freq: Every day | ORAL | Status: DC
  Administered 2022-09-05 – 2022-09-08 (×4): 60 mg via ORAL
  Filled 2022-09-03 (×5): qty 1

## 2022-09-03 MED ORDER — HYDROXYZINE HCL 25 MG PO TABS: 25.0000 mg | ORAL_TABLET | Freq: Two times a day (BID) | ORAL | Status: DC | PRN

## 2022-09-03 MED ORDER — VANCOMYCIN HCL 2000 MG/400ML IV SOLN
2000.0000 mg | Freq: Once | INTRAVENOUS | Status: AC
  Administered 2022-09-03: 2000 mg via INTRAVENOUS
  Filled 2022-09-03 (×2): qty 400

## 2022-09-03 MED ORDER — SODIUM CHLORIDE 0.9 % IV SOLN
1.0000 g | INTRAVENOUS | Status: DC
  Administered 2022-09-03 – 2022-09-07 (×5): 1 g via INTRAVENOUS
  Filled 2022-09-03 (×6): qty 10

## 2022-09-03 MED ORDER — SERTRALINE HCL 50 MG PO TABS
50.0000 mg | ORAL_TABLET | Freq: Every day | ORAL | Status: DC
  Administered 2022-09-05 – 2022-09-08 (×4): 50 mg via ORAL
  Filled 2022-09-03 (×4): qty 1

## 2022-09-03 MED ORDER — CALCITRIOL 0.25 MCG PO CAPS
0.2500 ug | ORAL_CAPSULE | Freq: Two times a day (BID) | ORAL | Status: DC
  Filled 2022-09-03 (×2): qty 1

## 2022-09-03 MED ORDER — INSULIN GLARGINE (2 UNIT DIAL) 300 UNIT/ML ~~LOC~~ SOPN: 30.0000 [IU] | PEN_INJECTOR | Freq: Every day | SUBCUTANEOUS | Status: DC

## 2022-09-03 NOTE — ED Notes (Signed)
2 IV attempts made, unsuccessful.  Will request 2nd RN to attempt to gain IV access

## 2022-09-03 NOTE — ED Triage Notes (Signed)
Patient had surgery on left foot 3 weeks ago and now has purulent drainage coming from wound.  She reports she has been going back and forth to MD and it is not getting better.  Dressing is saturated.

## 2022-09-03 NOTE — ED Notes (Signed)
Patient transported to X-ray 

## 2022-09-03 NOTE — ED Notes (Signed)
Pt to MRI at this time.

## 2022-09-03 NOTE — ED Provider Notes (Signed)
[facility] AT [facility] Provider Note   CSN: [id number] Arrival date & time: 09/03/22  1216     History  Chief Complaint  Patient presents with   Wound Infection    [name] is a 67 y.o. female with past medical history the ESRD on HD MWF, CVA, diabetes, hypertension who presents to the ED from her podiatrist office for admission due to wound infection from recent left transmetatarsal amputation on 08/15/2022. Pt states initially wound was doing well but has been opening up more lately and is now heavily draining and painful. She was seen by Dr. Lilian Kapur, podiatry, this morning and sent to ED for admission for IV antibiotics and scheduling of debridement and revision. Pt denies fever, chills, nausea, or vomiting at home. She was recently prescribed doxycycline by surgeon but has not had improvement in symptoms.        Home Medications Prior to Admission medications   Medication Sig Start Date End Date Taking? Authorizing Provider  aspirin 81 MG chewable tablet CHEW TWO TABLETS BY MOUTH EVERY DAY 07/24/22   [name], MD  azelastine (OPTIVAR) 0.05 % ophthalmic solution Place 1 drop into both eyes 2 (two) times daily. 05/25/22   [name], MD  B Complex-C-Folic Acid (RENA-VITE RX) 1 MG TABS Take 1 tablet by mouth daily. 04/15/22   [provider]  Blood Glucose Monitoring Suppl (ONETOUCH VERIO) w/Device KIT Use to check blood sugar 4X daily. 04/16/22   Carlus Pavlov, MD  calcitRIOL (ROCALTROL) 0.25 MCG capsule TAKE 1 CAPSULE (0.25 MCG TOTAL) BY MOUTH 2 (TWO) TIMES DAILY WITH A MEAL. 06/05/22   Roma Kayser, MD  Continuous Blood Gluc Receiver (FREESTYLE LIBRE 2 READER) DEVI 1 each by Does not apply route daily. 04/16/22   Carlus Pavlov, MD  Continuous Blood Gluc Sensor (FREESTYLE LIBRE 2 SENSOR) MISC 1 each by Does not apply route every 14 (fourteen) days. 04/16/22   Carlus Pavlov, MD  doxycycline (VIBRA-TABS)  100 MG tablet Take 1 tablet (100 mg total) by mouth 2 (two) times daily. 08/27/22   McDonald, Rachelle Hora, DPM  DULoxetine (CYMBALTA) 60 MG capsule TAKE (1) CAPSULE BY MOUTH ONCE DAILY. Patient taking differently: Take 60 mg by mouth daily. 09/17/20   [name], MD  ezetimibe (ZETIA) 10 MG tablet Take 1 tablet (10 mg total) by mouth daily. 05/25/22   [name], MD  FIASP FLEXTOUCH 100 UNIT/ML FlexTouch Pen Inject 10-20 Units into the skin See admin instructions. Inj 10-20 three times daily via sliding scale 08/06/22   [provider]  furosemide (LASIX) 80 MG tablet Take 80 mg by mouth 2 (two) times daily. 05/14/22   [provider]  glucose blood (ONETOUCH VERIO) test strip Use as instructed to check blood sugar 4X daily. 04/16/22   Carlus Pavlov, MD  hydrOXYzine (ATARAX) 25 MG tablet Take 25 mg by mouth every 12 (twelve) hours as needed for itching.    [provider]  insulin glargine, 2 Unit Dial, (TOUJEO MAX SOLOSTAR) 300 UNIT/ML Solostar Pen Inject 30 Units into the skin daily. May need to slowly increase the dose depending upon your blood sugar, follow-up with PCP 07/22/22   Lanae Boast, MD  Insulin Pen Needle 32G X 4 MM MISC Use 4x a day 05/25/22   Carlus Pavlov, MD  metoprolol succinate (TOPROL-XL) 50 MG 24 hr tablet TAKE (1) TABLET BY MOUTH DAILY WITH FOOD *TAKE AFTER DIALYSIS* Patient taking differently: Take 50  mg by mouth daily. 07/29/21   [name], MD  OneTouch Delica Lancets 33G MISC Use to check blood sugar 4X daily. 04/16/22   Carlus Pavlov, MD  oxyCODONE-acetaminophen (PERCOCET/ROXICET) 5-325 MG tablet Take 1 tablet by mouth every 4 (four) hours as needed. 08/18/22   Hughie Closs, MD  pregabalin (LYRICA) 50 MG capsule TAKE ONE CAPSULE BY MOUTH three times a day 08/24/22   Vickki Hearing, MD  sertraline (ZOLOFT) 50 MG tablet TAKE ONE TABLET BY MOUTH EVERY DAY 08/14/22   [name], MD  [name] 500 MG chewable tablet Chew  500-1,000 mg by mouth See admin instructions. Take 1000mg  (2 tablets) by mouth with meals and 500mg  (1 tablet) with snacks 04/15/22   [provider]  FLUoxetine (PROZAC) 10 MG capsule Take 10 mg by mouth daily.    05/28/11  [provider]  glipiZIDE (GLUCOTROL) 10 MG tablet Take 10 mg by mouth 2 (two) times daily before a meal.    05/28/11  [provider]      Allergies    Ace inhibitors, Penicillins, Statins, and Albuterol    Review of Systems   Review of Systems  All other systems reviewed and are negative.   Physical Exam Updated Vital Signs BP (!) 143/58 (BP Location: Right Arm)   Pulse 60   Temp 98.6 F (37 C) (Oral)   Resp 16   Ht 5\' 4"  (1.626 m)   Wt 78.9 kg   SpO2 99%   BMI 29.87 kg/m  Physical Exam Vitals and nursing note reviewed.  Constitutional:      General: She is not in acute distress.    Appearance: Normal appearance. She is not toxic-appearing.  HENT:     Head: Normocephalic and atraumatic.     Mouth/Throat:     Mouth: Mucous membranes are moist.  Eyes:     Conjunctiva/sclera: Conjunctivae normal.  Cardiovascular:     Rate and Rhythm: Normal rate and regular rhythm.     Heart sounds: No murmur heard. Pulmonary:     Effort: Pulmonary effort is normal.     Breath sounds: Normal breath sounds.  Abdominal:     General: Abdomen is flat.     Palpations: Abdomen is soft.  Musculoskeletal:     Cervical back: Neck supple.     Comments: 1+ DP and PT pulse, subjective tactile sensation intact, copious amounts of bloody purulent drainage, no erythema/streaking or tenderness from midfoot proximally which appears well perfused, see photo below  Skin:    General: Skin is warm and dry.  Neurological:     Mental Status: She is alert. Mental status is at baseline.  Psychiatric:        Behavior: Behavior normal.     ED Results / Procedures / Treatments   Labs (all labs ordered are listed, but only abnormal results are displayed) Labs  Reviewed  COMPREHENSIVE METABOLIC PANEL - Abnormal; Notable for the following components:      Result Value   Glucose, Bld 164 (*)    BUN 36 (*)    Creatinine, Ser 5.13 (*)    Calcium 8.6 (*)    Albumin 3.4 (*)    GFR, Estimated 9 (*)    All other components within normal limits  CBC WITH DIFFERENTIAL/PLATELET - Abnormal; Notable for the following components:   RBC 3.61 (*)    Hemoglobin 8.9 (*)    HCT 29.8 (*)    MCH 24.7 (*)    MCHC 29.9 (*)  RDW 18.4 (*)    All other components within normal limits  CULTURE, BLOOD (ROUTINE X 2)  CULTURE, BLOOD (ROUTINE X 2)  LACTIC ACID, PLASMA  LACTIC ACID, PLASMA  SEDIMENTATION RATE  C-REACTIVE PROTEIN    EKG None  Radiology DG Foot 2 Views Left  Result Date: 09/03/2022 CLINICAL DATA:  Infection.  Open wound. EXAM: LEFT FOOT - 2 VIEW COMPARISON:  Left foot radiographs 08/13/2022 FINDINGS: Interval amputation of the first through fifth digits to the proximal metatarsal shafts. There is bandage material overlying the distal soft tissues with postsurgical skin staples. The surgical bone resection margins appear fairly sharp. The acuity of the surgery and lack of expected sclerosis limit evaluation for early erosions. On lateral view the distal surgical wound appears to extend to the remaining distal bone. Moderate plantar and posterior calcaneal heel spurs. High-grade atherosclerotic calcifications. Additional distal calf subcutaneous fat lobule calcifications from likely chronic venous stasis, similar to prior. IMPRESSION: 1. Interval amputation of the first through fifth digits to the proximal metatarsal shafts. 2. There is bandage material overlying the distal soft tissues with postsurgical skin staples. The distal surgical wound appears to extend to the remaining distal bone. The surgical bone resection margins appear fairly sharp, and no definitive radiographic evidence of osteomyelitis is seen. Recommend clinical correlation for evidence of  infection contacting bone. Electronically Signed   By: Neita Garnet M.D.   On: 09/03/2022 13:53    Procedures Procedures    Medications Ordered in ED Medications  metroNIDAZOLE (FLAGYL) tablet 500 mg (has no administration in time range)  vancomycin (VANCOREADY) IVPB 2000 mg/400 mL (has no administration in time range)  ceFEPIme (MAXIPIME) 2 g in sodium chloride 0.9 % 100 mL IVPB (has no administration in time range)  ceFEPIme (MAXIPIME) 1 g in sodium chloride 0.9 % 100 mL IVPB (has no administration in time range)  vancomycin (VANCOREADY) IVPB 750 mg/150 mL (has no administration in time range)    ED Course/ Medical Decision Making/ A&P                             Medical Decision Making Amount and/or Complexity of Data Reviewed Labs: ordered. Decision-making details documented in ED Course. Radiology: ordered. Decision-making details documented in ED Course.  Risk Prescription drug management. Decision regarding hospitalization.   Medical Decision Making:   KONNIE KNEEDLER is a 67 y.o. female who presented to the ED today with lower extremity wound detailed above.    Patient's presentation is complicated by their history of recent amputation, DM, ESRD.  Complete initial physical exam performed, notably the patient was in NAD. See photo of wound above.    Reviewed and confirmed nursing documentation for past medical history, family history, social history.    Initial Assessment:   With the patient's presentation, differential diagnosis includes but is not limited to cellulitis, abscess, wound infection or necrosis, vascular injury, compartment syndrome, sepsis.  This is most consistent with an acute complicated illness  Initial Plan:  Screening labs including CBC and Metabolic panel to evaluate for infectious or metabolic etiology of disease.  XR to evaluate for structural/infectious pathology.  Lactate / blood cultures to assess severity of infection EKG to evaluate for  cardiac pathology Antibiotics Objective evaluation as below reviewed   Initial Study Results:   Laboratory  All laboratory results reviewed without evidence of clinically relevant pathology.   Exceptions include: Creatinine 5.13, albumin 3.4, hemoglobin 8.9  EKG  Pending  Radiology:  All images reviewed independently. Agree with radiology report at this time.   DG Foot 2 Views Left  Result Date: 09/03/2022 CLINICAL DATA:  Infection.  Open wound. EXAM: LEFT FOOT - 2 VIEW COMPARISON:  Left foot radiographs 08/13/2022 FINDINGS: Interval amputation of the first through fifth digits to the proximal metatarsal shafts. There is bandage material overlying the distal soft tissues with postsurgical skin staples. The surgical bone resection margins appear fairly sharp. The acuity of the surgery and lack of expected sclerosis limit evaluation for early erosions. On lateral view the distal surgical wound appears to extend to the remaining distal bone. Moderate plantar and posterior calcaneal heel spurs. High-grade atherosclerotic calcifications. Additional distal calf subcutaneous fat lobule calcifications from likely chronic venous stasis, similar to prior. IMPRESSION: 1. Interval amputation of the first through fifth digits to the proximal metatarsal shafts. 2. There is bandage material overlying the distal soft tissues with postsurgical skin staples. The distal surgical wound appears to extend to the remaining distal bone. The surgical bone resection margins appear fairly sharp, and no definitive radiographic evidence of osteomyelitis is seen. Recommend clinical correlation for evidence of infection contacting bone. Electronically Signed   By: Neita Garnet M.D.   On: 09/03/2022 13:53   DG Foot Complete Left  Result Date: 08/13/2022 Please see detailed radiograph report in office note.  DG Chest 2 View  Result Date: 08/13/2022 CLINICAL DATA:  Toe infection. EXAM: CHEST - 2 VIEW COMPARISON:  Chest  radiograph 04/24/2022 FINDINGS: The cardiac silhouette remains mildly enlarged. Lung volumes are low. No acute airspace opacity, edema, sizable pleural effusion, or pneumothorax is identified. Scarring is again noted in the left mid to lower lung. No acute osseous abnormality is seen. IMPRESSION: No active cardiopulmonary disease. Electronically Signed   By: Sebastian Ache M.D.   On: 08/13/2022 15:28   DG Foot Complete Left  Result Date: 08/06/2022 Please see detailed radiograph report in office note.     Consults: Case discussed with Dr. Lilian Kapur with podiatry who states Dr. Ralene Cork is on call this weekend but pt will not go to OR today.   Case discussed with Dr. Chipper Herb, HPS, who will admit. Will follow up on ordering MRI.   Final Assessment and Plan:   67 year old female presents to the ED with wound infection post transmetatarsal amputation on the left approximately 3 weeks ago.  She has completed doxycycline therapy outpatient with worsening of symptoms.  No fever, nausea, vomiting or systemic symptoms.  Patient afebrile here, nontoxic-appearing.  Vital signs stable.  Do not suspect sepsis. Plan as above.  Has large amounts of bloody purulent drainage, on initial exam saturating bandage that was just placed at podiatry before arrival.  Blood cultures obtained prior to starting antibiotics in case of deteriorating patient condition.  Lactic acid normal.  No leukocytosis.  Labs appear to be similar to baseline.  X-ray without evidence of osteomyelitis. HPS to admit, podiatry plan to complete surgical debridement likely this weekend.    Clinical Impression:  1. Left foot infection   2. Wound infection after surgery   3. Status post transmetatarsal amputation of foot, left (HCC)      Admit           Final Clinical Impression(s) / ED Diagnoses Final diagnoses:  Left foot infection  Wound infection after surgery  Status post transmetatarsal amputation of foot, left (HCC)    Rx / DC  Orders ED Discharge Orders     None  Richardson Dopp 09/03/22 1508    Jacalyn Lefevre, MD 09/04/22 [phone]

## 2022-09-03 NOTE — H&P (Signed)
History and Physical    Kathryn Beck WUJ:[id number] DOB: 03-Aug-1955 DOA: 09/03/2022  PCP: [name], MD (Confirm with patient/family/NH records and if not entered, this has to be entered at [facility] point of entry) Patient coming from: Home  I have personally briefly reviewed patient's old medical records in [facility] Link  Chief Complaint: : Left foot wound bleeding  HPI: Kathryn Beck is a 67 y.o. female with medical history significant of recent osteomyelitis and gangrene of left forefoot status post left TMA on 08/15/2022, ESRD on HD MWF, HTN, IDDM with insulin resistance, CVA, PVD status post right BKA and angiogram plus lithotripsy, sent from podiatrist office for evaluation of worsening of left foot surgical wound suspicious for osteomyelitis.  Patient was recently underwent left foot TMA and went home on wound VAC.  1 week ago it was found the patient developed a new infection of the surgical site, and patient was started on doxycycline twice daily.  However, continuously, patient has noticed constant bloody pus coming out above the left foot surgical wound, she does have severe neuropathy and does not feel any significant pain associated with her left foot wound.  Denies any fever or chills.  Today, she went to see a podiatrist on a routine visit who sent patient to ED for IV antibiotics and possible further surgical intervention.  ED Course: Afebrile, blood pressure elevated 160/61 none tachycardia nonhypoxic.  Creatinine 5.1, K3.7, WBC 9.5.  X-ray shows signs of early osteomyelitis and soft tissue swelling of the forefoot.  ED started patient on vancomycin cefepime and Flagyl  Review of Systems: As per HPI otherwise 14 point review of systems negative.    Past Medical History:  Diagnosis Date   Acid reflux    Anemia    Arthritis    Axillary masses    Soft tissue - status post excision   Back pain    COVID-19 virus infection 04/06/2019   Depression    End-stage  renal disease (HCC)    M/W/F dialysis   Essential hypertension    Headache    years ago   History of blood transfusion    History of cardiac catheterization    Normal coronary arteries October 2020   History of claustrophobia    History of pneumonia 2019   Hypoxia 04/03/2019   Memory loss    Mixed hyperlipidemia    Obesity    Pancreatitis    Peritoneal dialysis catheter in place [facility])    Pneumonia due to COVID-19 virus 04/02/2019   Sleep apnea    Noncompliant with CPAP   Stroke (HCC)    mini stroke   Type 2 diabetes mellitus (HCC)     Past Surgical History:  Procedure Laterality Date   ABDOMINAL AORTOGRAM W/LOWER EXTREMITY N/A 04/30/2022   Procedure: ABDOMINAL AORTOGRAM W/LOWER EXTREMITY;  Surgeon: Leonie Douglas, MD;  Location: [facility] INVASIVE CV LAB;  Service: Cardiovascular;  Laterality: N/A;   ABDOMINAL AORTOGRAM W/LOWER EXTREMITY N/A 07/21/2022   Procedure: ABDOMINAL AORTOGRAM W/LOWER EXTREMITY;  Surgeon: Nada Libman, MD;  Location: [facility] INVASIVE CV LAB;  Service: Cardiovascular;  Laterality: N/A;   ABDOMINAL HYSTERECTOMY     ACHILLES TENDON LENGTHENING  08/15/2022   Procedure: ACHILLES TENDON LENGTHENING;  Surgeon: [name], DPM;  Location: [facility] OR;  Service: Podiatry;;   AMPUTATION Right 05/29/2022   Procedure: RIGHT BELOW THE KNEE AMPUTATION;  Surgeon: [name], MD;  Location: [facility] OR;  Service: Orthopedics;  Laterality: Right;   AV  FISTULA PLACEMENT Left 09/02/2017   Procedure: creation of left arm ARTERIOVENOUS (AV) FISTULA;  Surgeon: Nada Libman, MD;  Location: [facility] OR;  Service: Vascular;  Laterality: Left;   COLONOSCOPY  2008   Dr. Darrick Penna: normal    COLONOSCOPY N/A 12/18/2016   Dr. Darrick Penna: multiple tubular adenomas, internal hemorrhoids. Surveillance in 3 years    ESOPHAGEAL DILATION N/A 10/13/2015   Procedure: ESOPHAGEAL DILATION;  Surgeon: Malissa Hippo, MD;  Location: [facility];  Service: Endoscopy;  Laterality: N/A;   ESOPHAGOGASTRODUODENOSCOPY  N/A 10/13/2015   Dr. Karilyn Cota: chronic gastritis on path, no H.pylori. Empiric dilation    ESOPHAGOGASTRODUODENOSCOPY N/A 12/18/2016   Dr. Darrick Penna: mild gastritis. BRAVO study revealed uncontrolled GERD. Dysphagia secondary to uncontrolled reflux   FOOT SURGERY Bilateral    "nerve"     LEFT HEART CATH AND CORONARY ANGIOGRAPHY N/A 12/29/2018   Procedure: LEFT HEART CATH AND CORONARY ANGIOGRAPHY;  Surgeon: Corky Crafts, MD;  Location: [facility] INVASIVE CV LAB;  Service: Cardiovascular;  Laterality: N/A;   LOWER EXTREMITY ANGIOGRAPHY Right 05/04/2022   Procedure: Lower Extremity Angiography;  Surgeon: Victorino Sparrow, MD;  Location: [facility] INVASIVE CV LAB;  Service: Cardiovascular;  Laterality: Right;   LUNG BIOPSY     MASS EXCISION Right 01/09/2013   Procedure: EXCISION OF NEOPLASM OF RIGHT  AXILLA  AND EXCISION OF NEOPLASM OF LEFT AXILLA;  Surgeon: Dalia Heading, MD;  Location: [facility] ORS;  Service: General;  Laterality: Right;  procedure end @ 08:23   MYRINGOTOMY WITH TUBE PLACEMENT Bilateral 04/28/2017   Procedure: BILATERAL MYRINGOTOMY WITH TUBE PLACEMENT;  Surgeon: Newman Pies, MD;  Location: [facility] OR;  Service: ENT;  Laterality: Bilateral;   PERIPHERAL VASCULAR BALLOON ANGIOPLASTY Right 05/04/2022   Procedure: PERIPHERAL VASCULAR BALLOON ANGIOPLASTY;  Surgeon: Victorino Sparrow, MD;  Location: [facility] INVASIVE CV LAB;  Service: Cardiovascular;  Laterality: Right;  PT   PERIPHERAL VASCULAR INTERVENTION Right 05/04/2022   Procedure: PERIPHERAL VASCULAR INTERVENTION;  Surgeon: Victorino Sparrow, MD;  Location: [facility] INVASIVE CV LAB;  Service: Cardiovascular;  Laterality: Right;  SFA   PERIPHERAL VASCULAR INTERVENTION Left 07/21/2022   Procedure: PERIPHERAL VASCULAR INTERVENTION;  Surgeon: Nada Libman, MD;  [facility]: [facility]VAS[facility] CV LAB;  Service: Cardiovascular;  Laterality: Left;   REVISION OF ARTERIOVENOUS GORETEX GRAFT Left 05/04/2018   Procedure: TRANSPOSITION OF CEPHALIC VEIN ARTERIOVENOUS FISTULA LEFT ARM;  Surgeon:  Larina Earthly, MD;  Location: [facility] OR;  Service: Vascular;  Laterality: Left;   SAVORY DILATION N/A 12/18/2016   Procedure: SAVORY DILATION;  Surgeon: West Bali, MD;  [facility]: [facility];  Service: Endoscopy;  Laterality: N/A;   TRANSMETATARSAL AMPUTATION Left 08/15/2022   Procedure: TRANSMETATARSAL AMPUTATION;  Surgeon: [name], DPM;  Location: [facility] OR;  Service: Podiatry;  Laterality: Left;     reports that she has never smoked. She has never been exposed to tobacco smoke. She has never used smokeless tobacco. She reports that she does not drink alcohol and does not use drugs.  Allergies  Allergen Reactions   Ace Inhibitors Anaphylaxis and Swelling   Penicillins Itching, Swelling and Other (See Comments)    Did it involve swelling of the face/tongue/throat, SOB, or low BP? Unknown Did it involve sudden or severe rash/hives, skin peeling, or any reaction on the inside of your mouth or nose? Unknown Did you need to seek medical attention at a hospital or doctor's office? Unknown When did it last happen?      years  If all above answers are "NO", may proceed with cephalosporin use.    Statins Other (See Comments)    elevated LFT's     Albuterol Swelling    Family History  Problem Relation Age of Onset   Hypertension Father    Hypercholesterolemia Father    Arthritis Father    Hypertension Sister    Hypercholesterolemia Sister    Breast cancer Sister    Hypertension Sister    Colon cancer Neg Hx    Colon polyps Neg Hx      Prior to Admission medications   Medication Sig Start Date End Date Taking? Authorizing Provider  aspirin 81 MG chewable tablet CHEW TWO TABLETS BY MOUTH EVERY DAY Patient taking differently: Chew 81 mg by mouth daily. 07/24/22  Yes [name], MD  azelastine (OPTIVAR) 0.05 % ophthalmic solution Place 1 drop into both eyes 2 (two) times daily. 05/25/22  Yes [name], MD  B Complex-C-Folic Acid (RENA-VITE RX) 1 MG TABS Take 1  tablet by mouth daily. 04/15/22  Yes [provider]  calcitRIOL (ROCALTROL) 0.25 MCG capsule TAKE 1 CAPSULE (0.25 MCG TOTAL) BY MOUTH 2 (TWO) TIMES DAILY WITH A MEAL. 06/05/22  Yes Nida, Denman George, MD  doxycycline (VIBRA-TABS) 100 MG tablet Take 1 tablet (100 mg total) by mouth 2 (two) times daily. 08/27/22  Yes McDonald, Rachelle Hora, DPM  DULoxetine (CYMBALTA) 60 MG capsule TAKE (1) CAPSULE BY MOUTH ONCE DAILY. Patient taking differently: Take 60 mg by mouth daily. 09/17/20  Yes [name], MD  ezetimibe (ZETIA) 10 MG tablet Take 1 tablet (10 mg total) by mouth daily. 05/25/22  Yes [name], MD  FIASP FLEXTOUCH 100 UNIT/ML FlexTouch Pen Inject 10-20 Units into the skin See admin instructions. Inj 10-20 three times daily via sliding scale 08/06/22  Yes [provider]  furosemide (LASIX) 80 MG tablet Take 80 mg by mouth 2 (two) times daily. 05/14/22  Yes [provider]  hydrOXYzine (ATARAX) 25 MG tablet Take 25 mg by mouth every 12 (twelve) hours as needed for itching.   Yes [provider]  insulin glargine, 2 Unit Dial, (TOUJEO MAX SOLOSTAR) 300 UNIT/ML Solostar Pen Inject 30 Units into the skin daily. May need to slowly increase the dose depending upon your blood sugar, follow-up with PCP 07/22/22  Yes Kc, Dayna Barker, MD  metoprolol succinate (TOPROL-XL) 50 MG 24 hr tablet TAKE (1) TABLET BY MOUTH DAILY WITH FOOD *TAKE AFTER DIALYSIS* Patient taking differently: Take 50 mg by mouth daily. 07/29/21  Yes [name], MD  oxyCODONE-acetaminophen (PERCOCET/ROXICET) 5-325 MG tablet Take 1 tablet by mouth every 4 (four) hours as needed. 08/18/22  Yes Pahwani, Daleen Bo, MD  pregabalin (LYRICA) 50 MG capsule TAKE ONE CAPSULE BY MOUTH three times a day 08/24/22  Yes Vickki Hearing, MD  sertraline (ZOLOFT) 50 MG tablet TAKE ONE TABLET BY MOUTH EVERY DAY 08/14/22  Yes [name], MD  [name] 500 MG chewable tablet Chew 500-1,000 mg by mouth See admin  instructions. Take 1000mg  (2 tablets) by mouth with meals and 500mg  (1 tablet) with snacks 04/15/22  Yes [provider]  Blood Glucose Monitoring Suppl (ONETOUCH VERIO) w/Device KIT Use to check blood sugar 4X daily. 04/16/22   Carlus Pavlov, MD  Continuous Blood Gluc Receiver (FREESTYLE LIBRE 2 READER) DEVI 1 each by Does not apply route daily. 04/16/22   Carlus Pavlov, MD  Continuous Blood Gluc Sensor (FREESTYLE LIBRE 2 SENSOR) MISC 1 each by  Does not apply route every 14 (fourteen) days. 04/16/22   Carlus Pavlov, MD  glucose blood (ONETOUCH VERIO) test strip Use as instructed to check blood sugar 4X daily. 04/16/22   Carlus Pavlov, MD  Insulin Pen Needle 32G X 4 MM MISC Use 4x a day 05/25/22   Carlus Pavlov, MD  OneTouch Delica Lancets 33G MISC Use to check blood sugar 4X daily. 04/16/22   Carlus Pavlov, MD  FLUoxetine (PROZAC) 10 MG capsule Take 10 mg by mouth daily.    05/28/11  [provider]  glipiZIDE (GLUCOTROL) 10 MG tablet Take 10 mg by mouth 2 (two) times daily before a meal.    05/28/11  [provider]    Physical Exam: Vitals:   09/03/22 1220 09/03/22 1235 09/03/22 1330  BP: (!) 163/61  (!) 143/58  Pulse: (!) 59  60  Resp: 17  16  Temp: 98.6 F (37 C)    TempSrc: Oral    SpO2: 99%  99%  Weight:  78.9 kg   Height:  5\' 4"  (1.626 m)     Constitutional: NAD, calm, comfortable Vitals:   09/03/22 1220 09/03/22 1235 09/03/22 1330  BP: (!) 163/61  (!) 143/58  Pulse: (!) 59  60  Resp: 17  16  Temp: 98.6 F (37 C)    TempSrc: Oral    SpO2: 99%  99%  Weight:  78.9 kg   Height:  5\' 4"  (1.626 m)    Eyes: PERRL, lids and conjunctivae normal ENMT: Mucous membranes are moist. Posterior pharynx clear of any exudate or lesions.Normal dentition.  Neck: normal, supple, no masses, no thyromegaly Respiratory: clear to auscultation bilaterally, no wheezing, no crackles. Normal respiratory effort. No accessory muscle use.  Cardiovascular:  Regular rate and rhythm, no murmurs / rubs / gallops. No extremity edema. 2+ pedal pulses. No carotid bruits.  Abdomen: no tenderness, no masses palpated. No hepatosplenomegaly. Bowel sounds positive.  Musculoskeletal: no clubbing / cyanosis. No joint deformity upper and lower extremities. Good ROM, no contractures. Normal muscle tone.  Skin: Dehiscence of the wound with deep ulcer of the forefoot TMA site, with thick bloody pus coming out from the largest opening on the medial side Neurologic: CN 2-12 grossly intact. Sensation intact, DTR normal. Strength 5/5 in all 4.  Psychiatric: Normal judgment and insight. Alert and oriented x 3. Normal mood.     Labs on Admission: I have personally reviewed following labs and imaging studies  CBC: Recent Labs  Lab 09/03/22 1237  WBC 9.5  NEUTROABS 6.8  HGB 8.9*  HCT 29.8*  MCV 82.5  PLT 354   Basic Metabolic Panel: Recent Labs  Lab 09/03/22 1237  NA 140  K 3.7  CL 101  CO2 24  GLUCOSE 164*  BUN 36*  CREATININE 5.13*  CALCIUM 8.6*   GFR: Estimated Creatinine Clearance: 11 mL/min (A) (by C-G formula based on SCr of 5.13 mg/dL (H)). Liver Function Tests: Recent Labs  Lab 09/03/22 1237  AST 17  ALT 24  ALKPHOS 96  BILITOT 0.4  PROT 7.6  ALBUMIN 3.4*   No results for input(s): "LIPASE", "AMYLASE" in the last 168 hours. No results for input(s): "AMMONIA" in the last 168 hours. Coagulation Profile: No results for input(s): "INR", "PROTIME" in the last 168 hours. Cardiac Enzymes: No results for input(s): "CKTOTAL", "CKMB", "CKMBINDEX", "TROPONINI" in the last 168 hours. BNP (last 3 results) No results for input(s): "PROBNP" in the last 8760 hours. HbA1C: No results for input(s): "HGBA1C" in  the last 72 hours. CBG: No results for input(s): "GLUCAP" in the last 168 hours. Lipid Profile: No results for input(s): "CHOL", "HDL", "LDLCALC", "TRIG", "CHOLHDL", "LDLDIRECT" in the last 72 hours. Thyroid Function Tests: No results  for input(s): "TSH", "T4TOTAL", "FREET4", "T3FREE", "THYROIDAB" in the last 72 hours. Anemia Panel: No results for input(s): "VITAMINB12", "FOLATE", "FERRITIN", "TIBC", "IRON", "RETICCTPCT" in the last 72 hours. Urine analysis:    Component Value Date/Time   COLORURINE YELLOW 11/07/2017 2340   APPEARANCEUR HAZY (A) 11/07/2017 2340   LABSPEC 1.017 11/07/2017 2340   PHURINE 5.0 11/07/2017 2340   GLUCOSEU 150 (A) 11/07/2017 2340   HGBUR NEGATIVE 11/07/2017 2340   BILIRUBINUR NEGATIVE 11/07/2017 2340   BILIRUBINUR neg 02/15/2017 0913   KETONESUR NEGATIVE 11/07/2017 2340   PROTEINUR >=300 (A) 11/07/2017 2340   UROBILINOGEN 0.2 02/15/2017 0913   NITRITE NEGATIVE 11/07/2017 2340   LEUKOCYTESUR NEGATIVE 11/07/2017 2340    Radiological Exams on Admission: MR FOOT LEFT WO CONTRAST  Result Date: 09/03/2022 CLINICAL DATA:  Left transmetatarsal amputation 08/15/2022. Wound infection. History of end-stage renal disease and diabetes. EXAM: MRI OF THE LEFT FOOT WITHOUT CONTRAST TECHNIQUE: Multiplanar, multisequence MR imaging of the left foot was performed. No intravenous contrast was administered. COMPARISON:  Radiographs 09/03/2022 and 08/13/2022. No previous MRI. FINDINGS: Bones/Joint/Cartilage Postsurgical changes from recent transmetatarsal amputation. Skin staples remain in place, causing some susceptibility artifact. There is nonspecific marrow edema in each of the remaining metatarsals adjacent to the amputation margins. No gross cortical destruction. Mild degenerative changes at the Lisfranc joint without subluxation. No suspicious tarsal bone findings. No significant joint effusions. Ligaments Intact Lisfranc ligament. Muscles and Tendons Nonspecific diffuse muscular edema which may relate to diabetes. No intramuscular fluid collections are demonstrated. Soft tissues Nonspecific ill-defined fluid adjacent to the metatarsal stumps, greatest medially. No well-defined fluid collections are identified.  There is mild nonspecific generalized subcutaneous edema. IMPRESSION: 1. Postsurgical changes from recent transmetatarsal amputation. 2. Nonspecific marrow edema in each of the remaining metatarsals adjacent to the amputation margins without gross cortical destruction. This could be reactive or secondary to early osteomyelitis. 3. Nonspecific ill-defined fluid adjacent to the metatarsal stumps, greatest medially. No well-defined fluid collections are identified. 4. Nonspecific diffuse muscular edema which may relate to diabetes. Electronically Signed   By: Carey Bullocks M.D.   On: 09/03/2022 16:02   DG Foot 2 Views Left  Result Date: 09/03/2022 CLINICAL DATA:  Infection.  Open wound. EXAM: LEFT FOOT - 2 VIEW COMPARISON:  Left foot radiographs 08/13/2022 FINDINGS: Interval amputation of the first through fifth digits to the proximal metatarsal shafts. There is bandage material overlying the distal soft tissues with postsurgical skin staples. The surgical bone resection margins appear fairly sharp. The acuity of the surgery and lack of expected sclerosis limit evaluation for early erosions. On lateral view the distal surgical wound appears to extend to the remaining distal bone. Moderate plantar and posterior calcaneal heel spurs. High-grade atherosclerotic calcifications. Additional distal calf subcutaneous fat lobule calcifications from likely chronic venous stasis, similar to prior. IMPRESSION: 1. Interval amputation of the first through fifth digits to the proximal metatarsal shafts. 2. There is bandage material overlying the distal soft tissues with postsurgical skin staples. The distal surgical wound appears to extend to the remaining distal bone. The surgical bone resection margins appear fairly sharp, and no definitive radiographic evidence of osteomyelitis is seen. Recommend clinical correlation for evidence of infection contacting bone. Electronically Signed   By: Neita Garnet M.D.   On:  09/03/2022  13:53    EKG: Pending  Assessment/Plan Principal Problem:   Osteomyelitis (HCC) Active Problems:   Acute osteomyelitis of toe of left foot (HCC)   ESRD on hemodialysis (HCC)   Chronic kidney disease, stage 4 (severe) (HCC)   Anemia of chronic disease  (please populate well all problems here in Problem List. (For example, if patient is on BP meds at home and you resume or decide to hold them, it is a problem that needs to be her. 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  PVD -Underwent extensive treatment by vascular surgery including angiogram, intra-arterial lithotripsy and stenting recently -Continue aspirin Plavix and statin  IDDM with insulin resistance -Most recent A1c 6.3 -Given her history of ESRD, will cut down long-acting insulin dosage -SSI  ESRD on HD -Secure chat with on-call nephrology Dr. Arlean Hopping for routine dialysis tomorrow, in for reply  HTN -Continue metoprolol  DVT prophylaxis: Heparin subcu Code Status: Full code Family Communication: Daughter at bedside Disposition Plan: Patient is sick with recurrent left foot osteomyelitis requiring IV antibiotics and inpatient podiatry consultation, expect more than 2 midnight hospital stay. Consults called: Podiatry Admission status: MedSurg admission   Emeline General MD [facility] Pager [phone]  09/03/2022, 4:27 PM

## 2022-09-03 NOTE — H&P (View-Only) (Signed)
Subjective:  Patient ID: Kathryn Beck, female    DOB: 09-26-1955,  MRN: [id number]  Patient with past medical history of ESRD, HD, HTN, Type 2 DM, CVA, PVD s/p right BKA  seen at beside today for worsenign left foot surgical wound. Seen by Dr. Lilian Kapur earlier today and advised to come to the hospital after surgical wound had dehisced and infection worsened. Patient has undergone vascular intervention with single vessel flow from peroneals. Has been in a rehab facility and they have been wrapping the foot.  Denies any pain currently. Denies any nausea vomiting fever chills. .   Past Medical History:  Diagnosis Date   Acid reflux    Anemia    Arthritis    Axillary masses    Soft tissue - status post excision   Back pain    COVID-19 virus infection 04/06/2019   Depression    End-stage renal disease (HCC)    M/W/F dialysis   Essential hypertension    Headache    years ago   History of blood transfusion    History of cardiac catheterization    Normal coronary arteries October 2020   History of claustrophobia    History of pneumonia 2019   Hypoxia 04/03/2019   Memory loss    Mixed hyperlipidemia    Obesity    Pancreatitis    Peritoneal dialysis catheter in place [facility])    Pneumonia due to COVID-19 virus 04/02/2019   Sleep apnea    Noncompliant with CPAP   Stroke (HCC)    mini stroke   Type 2 diabetes mellitus (HCC)      Past Surgical History:  Procedure Laterality Date   ABDOMINAL AORTOGRAM W/LOWER EXTREMITY N/A 04/30/2022   Procedure: ABDOMINAL AORTOGRAM W/LOWER EXTREMITY;  Surgeon: Leonie Douglas, MD;  [facility]: [facility] INVAS[facility] CV LAB;  Service: Cardiovascular;  Laterality: N/A;   ABDOMINAL AORTOGRAM W/LOWER EXTREMITY N/A 07/21/2022   Procedure: ABDOMINAL AORTOGRAM W/LOWER EXTREMITY;  Surgeon: Nada Libman, MD;  [facility]: [facility];  Service: Cardiovascular;  Laterality: N/A;   ABDOMINAL HYSTERECTOMY     ACHILLES TENDON LENGTHENING  08/15/2022   Procedure:  ACHILLES TENDON LENGTHENING;  Surgeon: [name], DPM;  Location: [facility] OR;  Service: Podiatry;;   AMPUTATION Right 05/29/2022   Procedure: RIGHT BELOW THE KNEE AMPUTATION;  Surgeon: [name], MD;  Location: [facility] OR;  Service: Orthopedics;  Laterality: Right;   AV FISTULA PLACEMENT Left 09/02/2017   Procedure: creation of left arm ARTERIOVENOUS (AV) FISTULA;  Surgeon: Nada Libman, MD;  Location: [facility] OR;  Service: Vascular;  Laterality: Left;   COLONOSCOPY  2008   Dr. Darrick Penna: normal    COLONOSCOPY N/A 12/18/2016   Dr. Darrick Penna: multiple tubular adenomas, internal hemorrhoids. Surveillance in 3 years    ESOPHAGEAL DILATION N/A 10/13/2015   Procedure: ESOPHAGEAL DILATION;  Surgeon: Malissa Hippo, MD;  [facility]: [facility];  Service: Endoscopy;  Laterality: N/A;   ESOPHAGOGASTRODUODENOSCOPY N/A 10/13/2015   Dr. Karilyn Cota: chronic gastritis on path, no H.pylori. Empiric dilation    ESOPHAGOGASTRODUODENOSCOPY N/A 12/18/2016   Dr. Darrick Penna: mild gastritis. BRAVO study revealed uncontrolled GERD. Dysphagia secondary to uncontrolled reflux   FOOT SURGERY Bilateral    "nerve"     LEFT HEART CATH AND CORONARY ANGIOGRAPHY N/A 12/29/2018   Procedure: LEFT HEART CATH AND CORONARY ANGIOGRAPHY;  Surgeon: Corky Crafts, MD;  Location: [facility] INVASIVE CV LAB;  Service: Cardiovascular;  Laterality: N/A;   LOWER EXTREMITY ANGIOGRAPHY Right 05/04/2022  Procedure: Lower Extremity Angiography;  Surgeon: Victorino Sparrow, MD;  Location: [facility] INVASIVE CV LAB;  Service: Cardiovascular;  Laterality: Right;   LUNG BIOPSY     MASS EXCISION Right 01/09/2013   Procedure: EXCISION OF NEOPLASM OF RIGHT  AXILLA  AND EXCISION OF NEOPLASM OF LEFT AXILLA;  Surgeon: Dalia Heading, MD;  Location: [facility] ORS;  Service: General;  Laterality: Right;  procedure end @ 08:23   MYRINGOTOMY WITH TUBE PLACEMENT Bilateral 04/28/2017   Procedure: BILATERAL MYRINGOTOMY WITH TUBE PLACEMENT;  Surgeon: Newman Pies, MD;  [facility]: [facility] OR;  Service:  ENT;  Laterality: Bilateral;   PERIPHERAL VASCULAR BALLOON ANGIOPLASTY Right 05/04/2022   Procedure: PERIPHERAL VASCULAR BALLOON ANGIOPLASTY;  Surgeon: Victorino Sparrow, MD;  Location: [facility] INVASIVE CV LAB;  Service: Cardiovascular;  Laterality: Right;  PT   PERIPHERAL VASCULAR INTERVENTION Right 05/04/2022   Procedure: PERIPHERAL VASCULAR INTERVENTION;  Surgeon: Victorino Sparrow, MD;  Location: [facility] INVASIVE CV LAB;  Service: Cardiovascular;  Laterality: Right;  SFA   PERIPHERAL VASCULAR INTERVENTION Left 07/21/2022   Procedure: PERIPHERAL VASCULAR INTERVENTION;  Surgeon: Nada Libman, MD;  [facility];  Service: Cardiovascular;  Laterality: Left;   REVISION OF ARTERIOVENOUS GORETEX GRAFT Left 05/04/2018   Procedure: TRANSPOSITION OF CEPHALIC VEIN ARTERIOVENOUS FISTULA LEFT ARM;  Surgeon: Larina Earthly, MD;  Location: [facility] OR;  Service: Vascular;  Laterality: Left;   SAVORY DILATION N/A 12/18/2016   Procedure: SAVORY DILATION;  Surgeon: West Bali, MD;  [facility];  Service: Endoscopy;  Laterality: N/A;   TRANSMETATARSAL AMPUTATION Left 08/15/2022   Procedure: TRANSMETATARSAL AMPUTATION;  Surgeon: [name], DPM;  [facility]: [facility] OR;  Service: Podiatry;  Laterality: Left;       Latest Ref Rng & Units 09/03/2022   12:37 PM 08/19/2022    6:37 AM 08/18/2022   12:52 AM  CBC  WBC 4.0 - 10.5 K/uL 9.5  13.8  11.6   Hemoglobin 12.0 - 15.0 g/dL 8.9  7.9  8.2   Hematocrit 36.0 - 46.0 % 29.8  26.0  27.4   Platelets 150 - 400 K/uL 354  313  274        Latest Ref Rng & Units 09/03/2022   12:37 PM 08/19/2022    6:37 AM 08/17/2022    7:37 AM  BMP  Glucose 70 - 99 mg/dL 161  096  045   BUN 8 - 23 mg/dL 36  38  43   Creatinine 0.44 - 1.00 mg/dL 4.09  8.11  9.14   Sodium 135 - 145 mmol/L 140  140  145   Potassium 3.5 - 5.1 mmol/L 3.7  3.9  4.8   Chloride 98 - 111 mmol/L 101  100  103   CO2 22 - 32 mmol/L 24  27  25    Calcium 8.9 - 10.3 mg/dL 8.6  9.6  8.9       Objective:   Vitals:   09/03/22 1330 09/03/22 1636  BP: (!) 143/58 (!) 159/65  Pulse: 60 73  Resp: 16 17  Temp:  98.5 F (36.9 C)  SpO2: 99% 100%    General:AA&O x 3. Normal mood and affect   Vascular: DP and PT pulses 2/4 bilateral. Brisk capillary refill to all digits. Pedal hair present   Neruological. Epicritic sensation grossly intact.   Derm: Right BKA, Left TMA site wound dehisced with erythema and edema surroudning as well as purulent drainage noted in incision site.  MSK: MMT 5/5 in dorsiflexion, plantar flexion, inversion and eversion. Normal joint ROM without pain or crepitus.    MRI   IMPRESSION: 1. Postsurgical changes from recent transmetatarsal amputation. 2. Nonspecific marrow edema in each of the remaining metatarsals adjacent to the amputation margins without gross cortical destruction. This could be reactive or secondary to early osteomyelitis. 3. Nonspecific ill-defined fluid adjacent to the metatarsal stumps, greatest medially. No well-defined fluid collections are identified. 4. Nonspecific diffuse muscular edema which may relate to diabetes.      Assessment & Plan:  Patient was evaluated and treated and all questions answered.  DX: s/p left transmetatrsal amputation with subsequent dehsicence and infection.  Wound care: betadine, DSD  Antibiotics: Broad spectrum IV antibtioics  NWB to left lower extremity  Discussed with patient diagnosis and treatment options.  Imaging reviewed. Concern for infection and possible osteomyelitis in the residual metatarsals. Discussed the need for debridement and possible revision of the transmetatarsal amputation.  Discussed taking to the OR tomorrow afternoon.  Will keep NPO after midnight.   Patient in agreement with plan and all questions answered.   Louann Sjogren, DPM  Accessible via secure chat for questions or concerns.

## 2022-09-03 NOTE — Consult Note (Signed)
Renal Service Consult Note [facility]  [name] 09/03/2022 [name], MD Requesting Physician: Dr. Chipper Herb  Reason for Consult: ESRD pt here w/ L foot infection HPI: The patient is a 67 y.o. year-old w/ PMH as below who presented to ED from podiatrist's office for worsening of her L foot wounds. Had recent L TMA and went home on wound VAC. She gained infection about 1 wk ago and was started on po abx but not getting any better. Pt was admitted. We are asked to see for dialysis.   Pt seen in room.  Says she is going to surgery tomorrow afternoon and could she dialyze in the early morning, "like 2 hrs".  I told her 3 hrs will be fine. No other c/o's.      ROS - denies CP, no joint pain, no HA, no blurry vision, no rash, no diarrhea, no nausea/ vomiting, no dysuria, no difficulty voiding   Past Medical History  Past Medical History:  Diagnosis Date   Acid reflux    Anemia    Arthritis    Axillary masses    Soft tissue - status post excision   Back pain    COVID-19 virus infection 04/06/2019   Depression    End-stage renal disease (HCC)    M/W/F dialysis   Essential hypertension    Headache    years ago   History of blood transfusion    History of cardiac catheterization    Normal coronary arteries October 2020   History of claustrophobia    History of pneumonia 2019   Hypoxia 04/03/2019   Memory loss    Mixed hyperlipidemia    Obesity    Pancreatitis    Peritoneal dialysis catheter in place [facility])    Pneumonia due to COVID-19 virus 04/02/2019   Sleep apnea    Noncompliant with CPAP   Stroke (HCC)    mini stroke   Type 2 diabetes mellitus (HCC)    Past Surgical History  Past Surgical History:  Procedure Laterality Date   ABDOMINAL AORTOGRAM W/LOWER EXTREMITY N/A 04/30/2022   Procedure: ABDOMINAL AORTOGRAM W/LOWER EXTREMITY;  Surgeon: Leonie Douglas, MD;  [facility];  Service: Cardiovascular;  Laterality: N/A;    ABDOMINAL AORTOGRAM W/LOWER EXTREMITY N/A 07/21/2022   Procedure: ABDOMINAL AORTOGRAM W/LOWER EXTREMITY;  Surgeon: Nada Libman, MD;  [facility];  Service: Cardiovascular;  Laterality: N/A;   ABDOMINAL HYSTERECTOMY     ACHILLES TENDON LENGTHENING  08/15/2022   Procedure: ACHILLES TENDON LENGTHENING;  Surgeon: [name], DPM;  [facility]: [facility] OR;  Service: Podiatry;;   AMPUTATION Right 05/29/2022   Procedure: RIGHT BELOW THE KNEE AMPUTATION;  Surgeon: [name], MD;  Location: [facility] OR;  Service: Orthopedics;  Laterality: Right;   AV FISTULA PLACEMENT Left 09/02/2017   Procedure: creation of left arm ARTERIOVENOUS (AV) FISTULA;  Surgeon: Nada Libman, MD;  Location: [facility] OR;  Service: Vascular;  Laterality: Left;   COLONOSCOPY  2008   Dr. Darrick Penna: normal    COLONOSCOPY N/A 12/18/2016   Dr. Darrick Penna: multiple tubular adenomas, internal hemorrhoids. Surveillance in 3 years    ESOPHAGEAL DILATION N/A 10/13/2015   Procedure: ESOPHAGEAL DILATION;  Surgeon: Malissa Hippo, MD;  [facility];  Service: Endoscopy;  Laterality: N/A;   ESOPHAGOGASTRODUODENOSCOPY N/A 10/13/2015   Dr. Karilyn Cota: chronic gastritis on path, no H.pylori. Empiric dilation    ESOPHAGOGASTRODUODENOSCOPY N/A 12/18/2016   Dr. Darrick Penna: mild gastritis. BRAVO  study revealed uncontrolled GERD. Dysphagia secondary to uncontrolled reflux   FOOT SURGERY Bilateral    "nerve"     LEFT HEART CATH AND CORONARY ANGIOGRAPHY N/A 12/29/2018   Procedure: LEFT HEART CATH AND CORONARY ANGIOGRAPHY;  Surgeon: Corky Crafts, MD;  Location: [facility] INVASIVE CV LAB;  Service: Cardiovascular;  Laterality: N/A;   LOWER EXTREMITY ANGIOGRAPHY Right 05/04/2022   Procedure: Lower Extremity Angiography;  Surgeon: Victorino Sparrow, MD;  Location: [facility] INVASIVE CV LAB;  Service: Cardiovascular;  Laterality: Right;   LUNG BIOPSY     MASS EXCISION Right 01/09/2013   Procedure: EXCISION OF NEOPLASM OF RIGHT  AXILLA  AND EXCISION OF  NEOPLASM OF LEFT AXILLA;  Surgeon: Dalia Heading, MD;  Location: [facility] ORS;  Service: General;  Laterality: Right;  procedure end @ 08:23   MYRINGOTOMY WITH TUBE PLACEMENT Bilateral 04/28/2017   Procedure: BILATERAL MYRINGOTOMY WITH TUBE PLACEMENT;  Surgeon: Newman Pies, MD;  Location: [facility] OR;  Service: ENT;  Laterality: Bilateral;   PERIPHERAL VASCULAR BALLOON ANGIOPLASTY Right 05/04/2022   Procedure: PERIPHERAL VASCULAR BALLOON ANGIOPLASTY;  Surgeon: Victorino Sparrow, MD;  Location: [facility] INVASIVE CV LAB;  Service: Cardiovascular;  Laterality: Right;  PT   PERIPHERAL VASCULAR INTERVENTION Right 05/04/2022   Procedure: PERIPHERAL VASCULAR INTERVENTION;  Surgeon: Victorino Sparrow, MD;  Location: [facility] INVASIVE CV LAB;  Service: Cardiovascular;  Laterality: Right;  SFA   PERIPHERAL VASCULAR INTERVENTION Left 07/21/2022   Procedure: PERIPHERAL VASCULAR INTERVENTION;  Surgeon: Nada Libman, MD;  [facility];  Service: Cardiovascular;  Laterality: Left;   REVISION OF ARTERIOVENOUS GORETEX GRAFT Left 05/04/2018   Procedure: TRANSPOSITION OF CEPHALIC VEIN ARTERIOVENOUS FISTULA LEFT ARM;  Surgeon: Larina Earthly, MD;  Location: [facility] OR;  Service: Vascular;  Laterality: Left;   SAVORY DILATION N/A 12/18/2016   Procedure: SAVORY DILATION;  Surgeon: West Bali, MD;  [facility];  Service: Endoscopy;  Laterality: N/A;   TRANSMETATARSAL AMPUTATION Left 08/15/2022   Procedure: TRANSMETATARSAL AMPUTATION;  Surgeon: [name], DPM;  Location: [facility] OR;  Service: Podiatry;  Laterality: Left;   Family History  Family History  Problem Relation Age of Onset   Hypertension Father    Hypercholesterolemia Father    Arthritis Father    Hypertension Sister    Hypercholesterolemia Sister    Breast cancer Sister    Hypertension Sister    Colon cancer Neg Hx    Colon polyps Neg Hx    Social History  reports that she has never smoked. She has never been exposed to tobacco smoke. She has never  used smokeless tobacco. She reports that she does not drink alcohol and does not use drugs. Allergies  Allergies  Allergen Reactions   Ace Inhibitors Anaphylaxis and Swelling   Penicillins Itching, Swelling and Other (See Comments)    Did it involve swelling of the face/tongue/throat, SOB, or low BP? Unknown Did it involve sudden or severe rash/hives, skin peeling, or any reaction on the inside of your mouth or nose? Unknown Did you need to seek medical attention at a hospital or doctor's office? Unknown When did it last happen?      years  If all above answers are "NO", may proceed with cephalosporin use.    Statins Other (See Comments)    elevated LFT's     Albuterol Swelling   Home medications Prior to Admission medications   Medication Sig Start Date End Date Taking? Authorizing Provider  aspirin 81 MG chewable  tablet CHEW TWO TABLETS BY MOUTH EVERY DAY Patient taking differently: Chew 81 mg by mouth daily. 07/24/22  Yes [name], MD  azelastine (OPTIVAR) 0.05 % ophthalmic solution Place 1 drop into both eyes 2 (two) times daily. 05/25/22  Yes [name], MD  B Complex-C-Folic Acid (RENA-VITE RX) 1 MG TABS Take 1 tablet by mouth daily. 04/15/22  Yes [provider]  calcitRIOL (ROCALTROL) 0.25 MCG capsule TAKE 1 CAPSULE (0.25 MCG TOTAL) BY MOUTH 2 (TWO) TIMES DAILY WITH A MEAL. 06/05/22  Yes Nida, Denman George, MD  doxycycline (VIBRA-TABS) 100 MG tablet Take 1 tablet (100 mg total) by mouth 2 (two) times daily. 08/27/22  Yes McDonald, Rachelle Hora, DPM  DULoxetine (CYMBALTA) 60 MG capsule TAKE (1) CAPSULE BY MOUTH ONCE DAILY. Patient taking differently: Take 60 mg by mouth daily. 09/17/20  Yes [name], MD  ezetimibe (ZETIA) 10 MG tablet Take 1 tablet (10 mg total) by mouth daily. 05/25/22  Yes [name], MD  FIASP FLEXTOUCH 100 UNIT/ML FlexTouch Pen Inject 10-20 Units into the skin See admin instructions. Inj 10-20 three times daily via sliding  scale 08/06/22  Yes [provider]  furosemide (LASIX) 80 MG tablet Take 80 mg by mouth 2 (two) times daily. 05/14/22  Yes [provider]  hydrOXYzine (ATARAX) 25 MG tablet Take 25 mg by mouth every 12 (twelve) hours as needed for itching.   Yes [provider]  insulin glargine, 2 Unit Dial, (TOUJEO MAX SOLOSTAR) 300 UNIT/ML Solostar Pen Inject 30 Units into the skin daily. May need to slowly increase the dose depending upon your blood sugar, follow-up with PCP 07/22/22  Yes Kc, Dayna Barker, MD  metoprolol succinate (TOPROL-XL) 50 MG 24 hr tablet TAKE (1) TABLET BY MOUTH DAILY WITH FOOD *TAKE AFTER DIALYSIS* Patient taking differently: Take 50 mg by mouth daily. 07/29/21  Yes [name], MD  oxyCODONE-acetaminophen (PERCOCET/ROXICET) 5-325 MG tablet Take 1 tablet by mouth every 4 (four) hours as needed. 08/18/22  Yes Pahwani, Daleen Bo, MD  pregabalin (LYRICA) 50 MG capsule TAKE ONE CAPSULE BY MOUTH three times a day 08/24/22  Yes Vickki Hearing, MD  sertraline (ZOLOFT) 50 MG tablet TAKE ONE TABLET BY MOUTH EVERY DAY 08/14/22  Yes [name], MD  [name] 500 MG chewable tablet Chew 500-1,000 mg by mouth See admin instructions. Take 1000mg  (2 tablets) by mouth with meals and 500mg  (1 tablet) with snacks 04/15/22  Yes [provider]  Blood Glucose Monitoring Suppl (ONETOUCH VERIO) w/Device KIT Use to check blood sugar 4X daily. 04/16/22   Carlus Pavlov, MD  Continuous Blood Gluc Receiver (FREESTYLE LIBRE 2 READER) DEVI 1 each by Does not apply route daily. 04/16/22   Carlus Pavlov, MD  Continuous Blood Gluc Sensor (FREESTYLE LIBRE 2 SENSOR) MISC 1 each by Does not apply route every 14 (fourteen) days. 04/16/22   Carlus Pavlov, MD  glucose blood (ONETOUCH VERIO) test strip Use as instructed to check blood sugar 4X daily. 04/16/22   Carlus Pavlov, MD  Insulin Pen Needle 32G X 4 MM MISC Use 4x a day 05/25/22   Carlus Pavlov, MD  OneTouch Delica  Lancets 33G MISC Use to check blood sugar 4X daily. 04/16/22   Carlus Pavlov, MD  FLUoxetine (PROZAC) 10 MG capsule Take 10 mg by mouth daily.    05/28/11  [provider]  glipiZIDE (GLUCOTROL) 10 MG tablet Take 10 mg by mouth 2 (two) times daily before a meal.  05/28/11  [provider]     Vitals:   09/03/22 1220 09/03/22 1235 09/03/22 1330 09/03/22 1636  BP: (!) 163/61  (!) 143/58 (!) 159/65  Pulse: (!) 59  60 73  Resp: 17  16 17   Temp: 98.6 F (37 C)   98.5 F (36.9 C)  TempSrc: Oral   Oral  SpO2: 99%  99% 100%  Weight:  78.9 kg    Height:  5\' 4"  (1.626 m)     Exam Gen alert, no distress No rash, cyanosis or gangrene Sclera anicteric, throat clear  No jvd or bruits Chest clear bilat to bases, no rales/ wheezing RRR no MRG Abd soft ntnd no mass or ascites +bs GU defer MS R AKA Ext no LE or UE edema Neuro is alert, Ox 3 , nf    L AVF+bruit       Home meds include - pending      OP HD: [place] MWF [facility]  3h   400/500  Heparin 1000bolus + 1000u/hr   L AVF   Assessment/ Plan: L foot wound infection - sp recent L TMA. Starting on IV abx. Says she will have surgery tomorrow.  ESRD - on HD MWF. Labs/ volume okay, no acute need for HD tonight. HD tomorrow 1st shift.  HTN/ volume - looks euvolemic, BP's good, not low. UF 2-3 L.  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  MBD ckd - CCa in range, add on phos.  IDDM - per pmd      Kathryn Moselle  MD [facility] 09/03/2022, 5:28 PM  Recent Labs  Lab 09/03/22 1237  HGB 8.9*  ALBUMIN 3.4*  CALCIUM 8.6*  CREATININE 5.13*  K 3.7   Inpatient medications:  [START ON 09/04/2022] aspirin  81 mg Oral Daily   [START ON 09/04/2022] calcitRIOL  0.25 mcg Oral BID WC   [START ON 09/04/2022] DULoxetine  60 mg Oral Daily   ezetimibe  10 mg Oral Daily   [START ON 09/04/2022] furosemide  80 mg Oral BID   heparin  5,000 Units Subcutaneous Q12H   insulin aspart  0-5 Units Subcutaneous QHS   insulin aspart  0-9  Units Subcutaneous TID WC   [START ON 09/04/2022] insulin glargine-yfgn  15 Units Subcutaneous Daily   ketotifen  1 drop Both Eyes BID   [START ON 09/04/2022] metoprolol succinate  50 mg Oral Daily   metroNIDAZOLE  500 mg Oral Q12H   [START ON 09/04/2022] pregabalin  50 mg Oral Daily   [START ON 09/04/2022] sertraline  50 mg Oral Daily   sucroferric oxyhydroxide  1,000 mg Oral TID WC   And   sucroferric oxyhydroxide  500 mg Oral Daily    ceFEPime (MAXIPIME) IV     ceFEPime (MAXIPIME) IV     vancomycin     [START ON 09/04/2022] vancomycin     hydrOXYzine, oxyCODONE-acetaminophen

## 2022-09-03 NOTE — Progress Notes (Signed)
Pharmacy Antibiotic Note  Kathryn Beck is a 67 y.o. female admitted on 09/03/2022 with diabetic foot infection. S/p transmetatarsal amputation of left foot 08/15/2022. Patient with gross dehiscence of amputation site with purulent discharge sent to ED by podiatry for IV antibiotics and I&D. PMH significant for ESRD on HD MWF - last HD session 09/02/2022. Pharmacy has been consulted for vancomycin and cefepime dosing. Patient also on metronidazole 500 mg IV q12h.   Plan: Vancomycin 2000 mg IV x1, followed by vancomycin 750 mg IV MWF post-HD  Cefepime 2g IV x1, followed by cefepime 1g IV q24h  Monitor clinical progress and cultures  Monitor for HD schedule adjustments and missed doses  F/u podiatry recommendations   Height: 5\' 4"  (162.6 cm) Weight: 78.9 kg (174 lb) IBW/kg (Calculated) : 54.7  Temp (24hrs), Avg:98.3 F (36.8 C), Min:97.9 F (36.6 C), Max:98.6 F (37 C)  No results for input(s): "WBC", "CREATININE", "LATICACIDVEN", "VANCOTROUGH", "VANCOPEAK", "VANCORANDOM", "GENTTROUGH", "GENTPEAK", "GENTRANDOM", "TOBRATROUGH", "TOBRAPEAK", "TOBRARND", "AMIKACINPEAK", "AMIKACINTROU", "AMIKACIN" in the last 168 hours.  Estimated Creatinine Clearance: 7.9 mL/min (A) (by C-G formula based on SCr of 7.11 mg/dL (H)).    Allergies  Allergen Reactions   Ace Inhibitors Anaphylaxis and Swelling   Penicillins Itching, Swelling and Other (See Comments)    Did it involve swelling of the face/tongue/throat, SOB, or low BP? Unknown Did it involve sudden or severe rash/hives, skin peeling, or any reaction on the inside of your mouth or nose? Unknown Did you need to seek medical attention at a hospital or doctor's office? Unknown When did it last happen?      years  If all above answers are "NO", may proceed with cephalosporin use.    Statins Other (See Comments)    elevated LFT's     Albuterol Swelling    Antimicrobials this admission: 6/13 vancomycin >>  6/13 cefepime >>   Dose adjustments  this admission: NA  Microbiology results: 6/13 BCx: sent  Thank you for allowing pharmacy to be a part of this patient's care.  Andreas Ohm, PharmD Pharmacy Resident  09/03/2022 1:02 PM

## 2022-09-03 NOTE — ED Notes (Signed)
2nd nurse attempted IV, unsuccessful, will place order for IV team

## 2022-09-03 NOTE — Plan of Care (Signed)
  Problem: Education: Goal: Ability to describe self-care measures that may prevent or decrease complications (Diabetes Survival Skills Education) will improve Outcome: Progressing   Problem: Coping: Goal: Ability to adjust to condition or change in health will improve Outcome: Progressing   Problem: Fluid Volume: Goal: Ability to maintain a balanced intake and output will improve Outcome: Progressing   Problem: Metabolic: Goal: Ability to maintain appropriate glucose levels will improve Outcome: Progressing   Problem: Skin Integrity: Goal: Risk for impaired skin integrity will decrease Outcome: Progressing   Problem: Education: Goal: Knowledge of General Education information will improve Description: Including pain rating scale, medication(s)/side effects and non-pharmacologic comfort measures Outcome: Progressing

## 2022-09-03 NOTE — Progress Notes (Addendum)
  Subjective:  Patient ID: Kathryn Beck, female    DOB: Nov 03, 1955,  MRN: [id number]  Chief Complaint  Patient presents with   Routine Post Op    op # 2-: Left TRANSMETATARSAL AMPUTATION- dos 08/15/2022     67 y.o. female returns for post-op check.  She returns today with her daughter, they say that the facility was concerned that this is reinfected again Review of Systems: Negative except as noted in the HPI. Denies N/V/F/Ch.   Objective:   Vitals:   09/03/22 1131  BP: (!) 159/64  Pulse: 61  Temp: 97.9 F (36.6 C)  SpO2: 96%   There is no height or weight on file to calculate BMI. Constitutional Well developed. Well nourished.  Vascular Flaps are cool to touch, shows pallor  Neurologic Normal speech. Oriented to person, place, and time. Epicritic sensation to light touch grossly reduced  Dermatologic Complete dehiscence of ulcer site with purulence  Orthopedic: She has minimal pain to palpation       Assessment:   1. S/P transmetatarsal amputation of foot, left (HCC)    Plan:  Patient was evaluated and treated and all questions answered.  S/p foot surgery left -Unfortunately has gross dehiscence of amputation site with purulent drainage. Culture taken. Recommend proceeding to ER for admission, will need debridement and revision, antibiotics  No follow-ups on file.

## 2022-09-03 NOTE — Consult Note (Signed)
Subjective:  Patient ID: Kathryn Beck, female    DOB: 04/26/1955,  MRN: [id number]  Patient with past medical history of ESRD, HD, HTN, Type 2 DM, CVA, PVD s/p right BKA  seen at beside today for worsenign left foot surgical wound. Seen by Dr. McDonald earlier today and advised to come to the hospital after surgical wound had dehisced and infection worsened. Patient has undergone vascular intervention with single vessel flow from peroneals. Has been in a rehab facility and they have been wrapping the foot.  Denies any pain currently. Denies any nausea vomiting fever chills. .   Past Medical History:  Diagnosis Date   Acid reflux    Anemia    Arthritis    Axillary masses    Soft tissue - status post excision   Back pain    COVID-19 virus infection 04/06/2019   Depression    End-stage renal disease (HCC)    M/W/F dialysis   Essential hypertension    Headache    years ago   History of blood transfusion    History of cardiac catheterization    Normal coronary arteries October 2020   History of claustrophobia    History of pneumonia 2019   Hypoxia 04/03/2019   Memory loss    Mixed hyperlipidemia    Obesity    Pancreatitis    Peritoneal dialysis catheter in place (HCC)    Pneumonia due to COVID-19 virus 04/02/2019   Sleep apnea    Noncompliant with CPAP   Stroke (HCC)    mini stroke   Type 2 diabetes mellitus (HCC)      Past Surgical History:  Procedure Laterality Date   ABDOMINAL AORTOGRAM W/LOWER EXTREMITY N/A 04/30/2022   Procedure: ABDOMINAL AORTOGRAM W/LOWER EXTREMITY;  Surgeon: Hawken, Thomas N, MD;  [facility];  Service: Cardiovascular;  Laterality: N/A;   ABDOMINAL AORTOGRAM W/LOWER EXTREMITY N/A 07/21/2022   Procedure: ABDOMINAL AORTOGRAM W/LOWER EXTREMITY;  Surgeon: Brabham, Vance W, MD;  [facility];  Service: Cardiovascular;  Laterality: N/A;   ABDOMINAL HYSTERECTOMY     ACHILLES TENDON LENGTHENING  08/15/2022   Procedure:  ACHILLES TENDON LENGTHENING;  Surgeon: McDonald, Adam R, DPM;  Location: [facility];  Service: Podiatry;;   AMPUTATION Right 05/29/2022   Procedure: RIGHT BELOW THE KNEE AMPUTATION;  Surgeon: Duda, Marcus V, MD;  Location: [facility];  Service: Orthopedics;  Laterality: Right;   AV FISTULA PLACEMENT Left 09/02/2017   Procedure: creation of left arm ARTERIOVENOUS (AV) FISTULA;  Surgeon: Brabham, Vance W, MD;  Location: [facility];  Service: Vascular;  Laterality: Left;   COLONOSCOPY  2008   Dr. Fields: normal    COLONOSCOPY N/A 12/18/2016   Dr. Fields: multiple tubular adenomas, internal hemorrhoids. Surveillance in 3 years    ESOPHAGEAL DILATION N/A 10/13/2015   Procedure: ESOPHAGEAL DILATION;  Surgeon: Najeeb U Rehman, MD;  [facility];  Service: Endoscopy;  Laterality: N/A;   ESOPHAGOGASTRODUODENOSCOPY N/A 10/13/2015   Dr. Rehman: chronic gastritis on path, no H.pylori. Empiric dilation    ESOPHAGOGASTRODUODENOSCOPY N/A 12/18/2016   Dr. Fields: mild gastritis. BRAVO study revealed uncontrolled GERD. Dysphagia secondary to uncontrolled reflux   FOOT SURGERY Bilateral    "nerve"     LEFT HEART CATH AND CORONARY ANGIOGRAPHY N/A 12/29/2018   Procedure: LEFT HEART CATH AND CORONARY ANGIOGRAPHY;  Surgeon: Varanasi, Jayadeep S, MD;  [facility];  Service: Cardiovascular;  Laterality: N/A;   LOWER EXTREMITY ANGIOGRAPHY Right 05/04/2022     Procedure: Lower Extremity Angiography;  Surgeon: Robins, Joshua E, MD;  [facility];  Service: Cardiovascular;  Laterality: Right;   LUNG BIOPSY     MASS EXCISION Right 01/09/2013   Procedure: EXCISION OF NEOPLASM OF RIGHT  AXILLA  AND EXCISION OF NEOPLASM OF LEFT AXILLA;  Surgeon: Mark A Jenkins, MD;  Location: [facility];  Service: General;  Laterality: Right;  procedure end @ 08:23   MYRINGOTOMY WITH TUBE PLACEMENT Bilateral 04/28/2017   Procedure: BILATERAL MYRINGOTOMY WITH TUBE PLACEMENT;  Surgeon: Teoh, Su, MD;  [facility];  Service:  ENT;  Laterality: Bilateral;   PERIPHERAL VASCULAR BALLOON ANGIOPLASTY Right 05/04/2022   Procedure: PERIPHERAL VASCULAR BALLOON ANGIOPLASTY;  Surgeon: Robins, Joshua E, MD;  [facility];  Service: Cardiovascular;  Laterality: Right;  PT   PERIPHERAL VASCULAR INTERVENTION Right 05/04/2022   Procedure: PERIPHERAL VASCULAR INTERVENTION;  Surgeon: Robins, Joshua E, MD;  [facility];  Service: Cardiovascular;  Laterality: Right;  SFA   PERIPHERAL VASCULAR INTERVENTION Left 07/21/2022   Procedure: PERIPHERAL VASCULAR INTERVENTION;  Surgeon: Brabham, Vance W, MD;  [facility];  Service: Cardiovascular;  Laterality: Left;   REVISION OF ARTERIOVENOUS GORETEX GRAFT Left 05/04/2018   Procedure: TRANSPOSITION OF CEPHALIC VEIN ARTERIOVENOUS FISTULA LEFT ARM;  Surgeon: Early, Todd F, MD;  [facility];  Service: Vascular;  Laterality: Left;   SAVORY DILATION N/A 12/18/2016   Procedure: SAVORY DILATION;  Surgeon: Fields, Sandi L, MD;  [facility];  Service: Endoscopy;  Laterality: N/A;   TRANSMETATARSAL AMPUTATION Left 08/15/2022   Procedure: TRANSMETATARSAL AMPUTATION;  Surgeon: McDonald, Adam R, DPM;  [facility];  Service: Podiatry;  Laterality: Left;       Latest Ref Rng & Units 09/03/2022   12:37 PM 08/19/2022    6:37 AM 08/18/2022   12:52 AM  CBC  WBC 4.0 - 10.5 K/uL 9.5  13.8  11.6   Hemoglobin 12.0 - 15.0 g/dL 8.9  7.9  8.2   Hematocrit 36.0 - 46.0 % 29.8  26.0  27.4   Platelets 150 - 400 K/uL 354  313  274        Latest Ref Rng & Units 09/03/2022   12:37 PM 08/19/2022    6:37 AM 08/17/2022    7:37 AM  BMP  Glucose 70 - 99 mg/dL 164  147  121   BUN 8 - 23 mg/dL 36  38  43   Creatinine 0.44 - 1.00 mg/dL 5.13  7.11  8.02   Sodium 135 - 145 mmol/L 140  140  145   Potassium 3.5 - 5.1 mmol/L 3.7  3.9  4.8   Chloride 98 - 111 mmol/L 101  100  103   CO2 22 - 32 mmol/L 24  27  25   Calcium 8.9 - 10.3 mg/dL 8.6  9.6  8.9       Objective:   Vitals:   09/03/22 1330 09/03/22 1636  BP: (!) 143/58 (!) 159/65  Pulse: 60 73  Resp: 16 17  Temp:  98.5 F (36.9 C)  SpO2: 99% 100%    General:AA&O x 3. Normal mood and affect   Vascular: DP and PT pulses 2/4 bilateral. Brisk capillary refill to all digits. Pedal hair present   Neruological. Epicritic sensation grossly intact.   Derm: Right BKA, Left TMA site wound dehisced with erythema and edema surroudning as well as purulent drainage noted in incision site.          MSK: MMT 5/5 in dorsiflexion, plantar flexion, inversion and eversion. Normal joint ROM without pain or crepitus.    MRI   IMPRESSION: 1. Postsurgical changes from recent transmetatarsal amputation. 2. Nonspecific marrow edema in each of the remaining metatarsals adjacent to the amputation margins without gross cortical destruction. This could be reactive or secondary to early osteomyelitis. 3. Nonspecific ill-defined fluid adjacent to the metatarsal stumps, greatest medially. No well-defined fluid collections are identified. 4. Nonspecific diffuse muscular edema which may relate to diabetes.      Assessment & Plan:  Patient was evaluated and treated and all questions answered.  DX: s/p left transmetatrsal amputation with subsequent dehsicence and infection.  Wound care: betadine, DSD  Antibiotics: Broad spectrum IV antibtioics  NWB to left lower extremity  Discussed with patient diagnosis and treatment options.  Imaging reviewed. Concern for infection and possible osteomyelitis in the residual metatarsals. Discussed the need for debridement and possible revision of the transmetatarsal amputation.  Discussed taking to the OR tomorrow afternoon.  Will keep NPO after midnight.   Patient in agreement with plan and all questions answered.   Sadat Sliwa, DPM  Accessible via secure chat for questions or concerns.  

## 2022-09-04 ENCOUNTER — Encounter (HOSPITAL_COMMUNITY): Payer: Self-pay | Admitting: Internal Medicine

## 2022-09-04 ENCOUNTER — Inpatient Hospital Stay (HOSPITAL_COMMUNITY): Payer: Medicare PPO | Admitting: Certified Registered Nurse Anesthetist

## 2022-09-04 ENCOUNTER — Encounter (HOSPITAL_COMMUNITY)
Admission: EM | Disposition: A | Discharge status: Skilled Nursing Facility | Payer: Self-pay | Source: Home / Self Care | Attending: Internal Medicine

## 2022-09-04 ENCOUNTER — Other Ambulatory Visit: Payer: Self-pay

## 2022-09-04 DIAGNOSIS — Z794 Long term (current) use of insulin: Secondary | ICD-10-CM

## 2022-09-04 DIAGNOSIS — Z992 Dependence on renal dialysis: Secondary | ICD-10-CM

## 2022-09-04 DIAGNOSIS — N186 End stage renal disease: Secondary | ICD-10-CM

## 2022-09-04 DIAGNOSIS — I120 Hypertensive chronic kidney disease with stage 5 chronic kidney disease or end stage renal disease: Secondary | ICD-10-CM

## 2022-09-04 DIAGNOSIS — M86672 Other chronic osteomyelitis, left ankle and foot: Secondary | ICD-10-CM | POA: Diagnosis not present

## 2022-09-04 DIAGNOSIS — T8744 Infection of amputation stump, left lower extremity: Secondary | ICD-10-CM

## 2022-09-04 DIAGNOSIS — Z89432 Acquired absence of left foot: Secondary | ICD-10-CM | POA: Diagnosis not present

## 2022-09-04 DIAGNOSIS — D631 Anemia in chronic kidney disease: Secondary | ICD-10-CM

## 2022-09-04 DIAGNOSIS — L089 Local infection of the skin and subcutaneous tissue, unspecified: Secondary | ICD-10-CM | POA: Diagnosis not present

## 2022-09-04 DIAGNOSIS — E1151 Type 2 diabetes mellitus with diabetic peripheral angiopathy without gangrene: Secondary | ICD-10-CM

## 2022-09-04 DIAGNOSIS — T8149XA Infection following a procedure, other surgical site, initial encounter: Secondary | ICD-10-CM

## 2022-09-04 DIAGNOSIS — E1122 Type 2 diabetes mellitus with diabetic chronic kidney disease: Secondary | ICD-10-CM

## 2022-09-04 DIAGNOSIS — D638 Anemia in other chronic diseases classified elsewhere: Secondary | ICD-10-CM

## 2022-09-04 DIAGNOSIS — M86172 Other acute osteomyelitis, left ankle and foot: Secondary | ICD-10-CM | POA: Diagnosis not present

## 2022-09-04 HISTORY — PX: AMPUTATION: SHX166

## 2022-09-04 LAB — CBC WITH DIFFERENTIAL/PLATELET
Abs Immature Granulocytes: 0.05 10*3/uL (ref 0.00–0.07)
Basophils Absolute: 0 10*3/uL (ref 0.0–0.1)
Basophils Relative: 0 %
Eosinophils Absolute: 0.1 10*3/uL (ref 0.0–0.5)
Eosinophils Relative: 1 %
HCT: 26.5 % — ABNORMAL LOW (ref 36.0–46.0)
Hemoglobin: 8.1 g/dL — ABNORMAL LOW (ref 12.0–15.0)
Immature Granulocytes: 1 %
Lymphocytes Relative: 20 %
Lymphs Abs: 1.9 10*3/uL (ref 0.7–4.0)
MCH: 24.8 pg — ABNORMAL LOW (ref 26.0–34.0)
MCHC: 30.6 g/dL (ref 30.0–36.0)
MCV: 81.3 fL (ref 80.0–100.0)
Monocytes Absolute: 0.8 10*3/uL (ref 0.1–1.0)
Monocytes Relative: 8 %
Neutro Abs: 6.5 10*3/uL (ref 1.7–7.7)
Neutrophils Relative %: 70 %
Platelets: 350 10*3/uL (ref 150–400)
RBC: 3.26 MIL/uL — ABNORMAL LOW (ref 3.87–5.11)
RDW: 18.5 % — ABNORMAL HIGH (ref 11.5–15.5)
WBC: 9.3 10*3/uL (ref 4.0–10.5)
nRBC: 0.2 % (ref 0.0–0.2)

## 2022-09-04 LAB — BASIC METABOLIC PANEL
Anion gap: 14 (ref 5–15)
BUN: 53 mg/dL — ABNORMAL HIGH (ref 8–23)
CO2: 24 mmol/L (ref 22–32)
Calcium: 8.6 mg/dL — ABNORMAL LOW (ref 8.9–10.3)
Chloride: 102 mmol/L (ref 98–111)
Creatinine, Ser: 5.95 mg/dL — ABNORMAL HIGH (ref 0.44–1.00)
GFR, Estimated: 7 mL/min — ABNORMAL LOW (ref >60)
Glucose, Bld: 246 mg/dL — ABNORMAL HIGH (ref 70–99)
Potassium: 3.5 mmol/L (ref 3.5–5.1)
Sodium: 140 mmol/L (ref 135–145)

## 2022-09-04 LAB — GLUCOSE, CAPILLARY
Glucose-Capillary: 156 mg/dL — ABNORMAL HIGH (ref 70–99)
Glucose-Capillary: 88 mg/dL (ref 70–99)
Glucose-Capillary: 88 mg/dL (ref 70–99)
Glucose-Capillary: 93 mg/dL (ref 70–99)
Glucose-Capillary: 126 mg/dL — ABNORMAL HIGH (ref 70–99)
Glucose-Capillary: 317 mg/dL — ABNORMAL HIGH (ref 70–99)

## 2022-09-04 LAB — POCT I-STAT, CHEM 8
BUN: 26 mg/dL — ABNORMAL HIGH (ref 8–23)
Calcium, Ion: 1.14 mmol/L — ABNORMAL LOW (ref 1.15–1.40)
Chloride: 97 mmol/L — ABNORMAL LOW (ref 98–111)
Creatinine, Ser: 3.4 mg/dL — ABNORMAL HIGH (ref 0.44–1.00)
Glucose, Bld: 95 mg/dL (ref 70–99)
HCT: 29 % — ABNORMAL LOW (ref 36.0–46.0)
Hemoglobin: 9.9 g/dL — ABNORMAL LOW (ref 12.0–15.0)
Potassium: 3.5 mmol/L (ref 3.5–5.1)
Sodium: 139 mmol/L (ref 135–145)
TCO2: 35 mmol/L — ABNORMAL HIGH (ref 22–32)

## 2022-09-04 LAB — CULTURE, BLOOD (ROUTINE X 2)

## 2022-09-04 LAB — HEPATIC FUNCTION PANEL
ALT: 22 U/L (ref 0–44)
AST: 16 U/L (ref 15–41)
Albumin: 2.9 g/dL — ABNORMAL LOW (ref 3.5–5.0)
Alkaline Phosphatase: 96 U/L (ref 38–126)
Bilirubin, Direct: <0.1 mg/dL (ref 0.0–0.2)
Total Bilirubin: 0.7 mg/dL (ref 0.3–1.2)
Total Protein: 6.7 g/dL (ref 6.5–8.1)

## 2022-09-04 LAB — SURGICAL PCR SCREEN
MRSA, PCR: NEGATIVE
Staphylococcus aureus: NEGATIVE

## 2022-09-04 LAB — AEROBIC/ANAEROBIC CULTURE W GRAM STAIN (SURGICAL/DEEP WOUND)

## 2022-09-04 LAB — MAGNESIUM: Magnesium: 2.1 mg/dL (ref 1.7–2.4)

## 2022-09-04 LAB — PHOSPHORUS: Phosphorus: 5.1 mg/dL — ABNORMAL HIGH (ref 2.5–4.6)

## 2022-09-04 SURGERY — AMPUTATION, FOOT, PARTIAL
Anesthesia: Monitor Anesthesia Care | Site: Foot | Laterality: Left

## 2022-09-04 MED ORDER — HEPARIN SODIUM (PORCINE) 1000 UNIT/ML DIALYSIS
1500.0000 [IU] | INTRAMUSCULAR | Status: DC | PRN
  Filled 2022-09-04: qty 2

## 2022-09-04 MED ORDER — OXYCODONE HCL 5 MG/5ML PO SOLN: 5.0000 mg | Freq: Once | ORAL | Status: DC | PRN

## 2022-09-04 MED ORDER — OXYCODONE HCL 5 MG PO TABS: 5.0000 mg | ORAL_TABLET | Freq: Once | ORAL | Status: DC | PRN

## 2022-09-04 MED ORDER — LIDOCAINE HCL 2 % IJ SOLN
INTRAMUSCULAR | Status: DC | PRN
  Administered 2022-09-04: 10 mL via INTRADERMAL

## 2022-09-04 MED ORDER — PHENYLEPHRINE 80 MCG/ML (10ML) SYRINGE FOR IV PUSH (FOR BLOOD PRESSURE SUPPORT)
PREFILLED_SYRINGE | INTRAVENOUS | Status: DC | PRN
  Administered 2022-09-04 (×2): 80 ug via INTRAVENOUS

## 2022-09-04 MED ORDER — PROPOFOL 500 MG/50ML IV EMUL
INTRAVENOUS | Status: DC | PRN
  Administered 2022-09-04: 100 ug/kg/min via INTRAVENOUS

## 2022-09-04 MED ORDER — SODIUM CHLORIDE 0.9 % IV SOLN: INTRAVENOUS | Status: DC

## 2022-09-04 MED ORDER — FENTANYL CITRATE (PF) 250 MCG/5ML IJ SOLN
INTRAMUSCULAR | Status: AC
  Filled 2022-09-04: qty 5

## 2022-09-04 MED ORDER — PROSOURCE PLUS PO LIQD
30.0000 mL | Freq: Two times a day (BID) | ORAL | Status: DC
  Administered 2022-09-05 (×2): 30 mL via ORAL
  Filled 2022-09-04 (×5): qty 30

## 2022-09-04 MED ORDER — BUPIVACAINE HCL (PF) 0.5 % IJ SOLN
INTRAMUSCULAR | Status: DC | PRN
  Administered 2022-09-04: 10 mL via INTRA_ARTICULAR

## 2022-09-04 MED ORDER — PROMETHAZINE HCL 25 MG/ML IJ SOLN: 6.2500 mg | INTRAMUSCULAR | Status: DC | PRN

## 2022-09-04 MED ORDER — ORAL CARE MOUTH RINSE: 15.0000 mL | Freq: Once | OROMUCOSAL | Status: AC

## 2022-09-04 MED ORDER — FENTANYL CITRATE (PF) 100 MCG/2ML IJ SOLN: 25.0000 ug | INTRAMUSCULAR | Status: DC | PRN

## 2022-09-04 MED ORDER — MIDAZOLAM HCL 2 MG/2ML IJ SOLN
INTRAMUSCULAR | Status: AC
  Filled 2022-09-04: qty 2

## 2022-09-04 MED ORDER — HEPARIN SODIUM (PORCINE) 1000 UNIT/ML DIALYSIS
2000.0000 [IU] | Freq: Once | INTRAMUSCULAR | Status: AC
  Administered 2022-09-04: 2000 [IU] via INTRAVENOUS_CENTRAL
  Filled 2022-09-04: qty 2

## 2022-09-04 MED ORDER — ONDANSETRON HCL 4 MG/2ML IJ SOLN
INTRAMUSCULAR | Status: DC | PRN
  Administered 2022-09-04: 4 mg via INTRAVENOUS

## 2022-09-04 MED ORDER — CHLORHEXIDINE GLUCONATE CLOTH 2 % EX PADS
6.0000 | MEDICATED_PAD | Freq: Once | CUTANEOUS | Status: AC
  Administered 2022-09-04: 6 via TOPICAL

## 2022-09-04 MED ORDER — BUPIVACAINE HCL (PF) 0.5 % IJ SOLN
INTRAMUSCULAR | Status: AC
  Filled 2022-09-04: qty 30

## 2022-09-04 MED ORDER — MIDAZOLAM HCL 2 MG/2ML IJ SOLN: 0.5000 mg | Freq: Once | INTRAMUSCULAR | Status: DC | PRN

## 2022-09-04 MED ORDER — CHLORHEXIDINE GLUCONATE 0.12 % MT SOLN: 15.0000 mL | Freq: Once | OROMUCOSAL | Status: AC

## 2022-09-04 MED ORDER — CINACALCET HCL 30 MG PO TABS
90.0000 mg | ORAL_TABLET | ORAL | Status: DC
  Administered 2022-09-07: 90 mg via ORAL
  Filled 2022-09-04 (×3): qty 3

## 2022-09-04 MED ORDER — CHLORHEXIDINE GLUCONATE 0.12 % MT SOLN
OROMUCOSAL | Status: AC
  Administered 2022-09-04: 15 mL via OROMUCOSAL
  Filled 2022-09-04: qty 15

## 2022-09-04 MED ORDER — 0.9 % SODIUM CHLORIDE (POUR BTL) OPTIME
TOPICAL | Status: DC | PRN
  Administered 2022-09-04: 1000 mL

## 2022-09-04 MED ORDER — CHLORHEXIDINE GLUCONATE CLOTH 2 % EX PADS: 6.0000 | MEDICATED_PAD | Freq: Once | CUTANEOUS | Status: DC

## 2022-09-04 MED ORDER — MIDAZOLAM HCL 2 MG/2ML IJ SOLN
INTRAMUSCULAR | Status: DC | PRN
  Administered 2022-09-04: 2 mg via INTRAVENOUS

## 2022-09-04 MED ORDER — LIDOCAINE HCL 2 % IJ SOLN
INTRAMUSCULAR | Status: AC
  Filled 2022-09-04: qty 20

## 2022-09-04 SURGICAL SUPPLY — 61 items
APL PRP STRL LF DISP 70% ISPRP (MISCELLANEOUS)
BAG COUNTER SPONGE SURGICOUNT (BAG) ×1 IMPLANT
BAG SPNG CNTER NS LX DISP (BAG) ×1
BLADE LONG MED 31X9 (MISCELLANEOUS) IMPLANT
BLADE SAW SGTL 83.5X18.5 (BLADE) IMPLANT
BLADE SURG 15 STRL LF DISP TIS (BLADE) IMPLANT
BLADE SURG 15 STRL SS (BLADE)
BNDG CMPR 9X4 STRL LF SNTH (GAUZE/BANDAGES/DRESSINGS) ×1
BNDG ELASTIC 4X5.8 VLCR STR LF (GAUZE/BANDAGES/DRESSINGS) IMPLANT
BNDG ESMARK 4X9 LF (GAUZE/BANDAGES/DRESSINGS) IMPLANT
BNDG GAUZE DERMACEA FLUFF 4 (GAUZE/BANDAGES/DRESSINGS) ×1 IMPLANT
BNDG GZE DERMACEA 4 6PLY (GAUZE/BANDAGES/DRESSINGS) ×1
BRUSH SCRUB EZ PLAIN DRY (MISCELLANEOUS) IMPLANT
CHLORAPREP W/TINT 26 (MISCELLANEOUS) IMPLANT
COVER SURGICAL LIGHT HANDLE (MISCELLANEOUS) ×1 IMPLANT
CUFF TOURN SGL QUICK 18X4 (TOURNIQUET CUFF) ×1 IMPLANT
CUFF TOURN SGL QUICK 24 (TOURNIQUET CUFF)
CUFF TRNQT CYL 24X4X16.5-23 (TOURNIQUET CUFF) IMPLANT
DRSG EMULSION OIL 3X3 NADH (GAUZE/BANDAGES/DRESSINGS) IMPLANT
ELECT REM PT RETURN 9FT ADLT (ELECTROSURGICAL) ×1
ELECTRODE REM PT RTRN 9FT ADLT (ELECTROSURGICAL) IMPLANT
GAUZE PAD ABD 8X10 STRL (GAUZE/BANDAGES/DRESSINGS) ×1 IMPLANT
GAUZE SPONGE 4X4 12PLY STRL (GAUZE/BANDAGES/DRESSINGS) ×1 IMPLANT
GAUZE XEROFORM 1X8 LF (GAUZE/BANDAGES/DRESSINGS) ×1 IMPLANT
GLOVE BIO SURGEON STRL SZ7 (GLOVE) IMPLANT
GLOVE BIO SURGEON STRL SZ7.5 (GLOVE) IMPLANT
GLOVE BIO SURGEON STRL SZ8 (GLOVE) ×1 IMPLANT
GLOVE BIOGEL PI IND STRL 7.0 (GLOVE) IMPLANT
GLOVE BIOGEL PI IND STRL 7.5 (GLOVE) IMPLANT
GLOVE BIOGEL PI IND STRL 8 (GLOVE) ×1 IMPLANT
GOWN STRL REUS W/ TWL LRG LVL3 (GOWN DISPOSABLE) ×2 IMPLANT
GOWN STRL REUS W/ TWL XL LVL3 (GOWN DISPOSABLE) IMPLANT
GOWN STRL REUS W/TWL LRG LVL3 (GOWN DISPOSABLE) ×1
GOWN STRL REUS W/TWL XL LVL3 (GOWN DISPOSABLE) ×2
KIT BASIN OR (CUSTOM PROCEDURE TRAY) ×1 IMPLANT
KIT REMOVER STAPLE SKIN (MISCELLANEOUS) IMPLANT
KIT TURNOVER KIT B (KITS) ×1 IMPLANT
NDL PRECISIONGLIDE 27X1.5 (NEEDLE) ×1 IMPLANT
NEEDLE PRECISIONGLIDE 27X1.5 (NEEDLE) ×1 IMPLANT
NS IRRIG 1000ML POUR BTL (IV SOLUTION) ×1 IMPLANT
PACK ORTHO EXTREMITY (CUSTOM PROCEDURE TRAY) ×1 IMPLANT
PAD ARMBOARD 7.5X6 YLW CONV (MISCELLANEOUS) ×2 IMPLANT
PAD CAST 4YDX4 CTTN HI CHSV (CAST SUPPLIES) ×1 IMPLANT
PADDING CAST COTTON 4X4 STRL (CAST SUPPLIES)
SOL PREP POV-IOD 4OZ 10% (MISCELLANEOUS) ×1 IMPLANT
SOL SCRUB PVP POV-IOD 4OZ 7.5% (MISCELLANEOUS) ×1
SOLUTION SCRB POV-IOD 4OZ 7.5% (MISCELLANEOUS) IMPLANT
SPONGE T-LAP 18X18 ~~LOC~~+RFID (SPONGE) IMPLANT
STAPLER VISISTAT 35W (STAPLE) IMPLANT
SUT ETHILON 3 0 FSL (SUTURE) IMPLANT
SUT PROLENE 4 0 PS 2 18 (SUTURE) IMPLANT
SUT VIC AB 3-0 PS2 18 (SUTURE)
SUT VIC AB 3-0 PS2 18XBRD (SUTURE) IMPLANT
SUT VICRYL 4-0 PS2 18IN ABS (SUTURE) IMPLANT
SWAB COLLECTION DEVICE MRSA (MISCELLANEOUS) IMPLANT
SWAB CULTURE ESWAB REG 1ML (MISCELLANEOUS) IMPLANT
SYR CONTROL 10ML LL (SYRINGE) ×1 IMPLANT
TOWEL GREEN STERILE (TOWEL DISPOSABLE) ×1 IMPLANT
TOWEL GREEN STERILE FF (TOWEL DISPOSABLE) ×1 IMPLANT
TUBE CONNECTING 12X1/4 (SUCTIONS) ×1 IMPLANT
YANKAUER SUCT BULB TIP NO VENT (SUCTIONS) IMPLANT

## 2022-09-04 NOTE — Transfer of Care (Signed)
Immediate Anesthesia Transfer of Care Note  Patient: Kathryn Beck  Procedure(s) Performed: AMPUTATION FOOT, serial irrigation (Left: Foot)  Patient Location: PACU  Anesthesia Type:MAC  Level of Consciousness: drowsy  Airway & Oxygen Therapy: Patient Spontanous Breathing and Patient connected to face mask oxygen  Post-op Assessment: Report given to RN and Post -op Vital signs reviewed and stable  Post vital signs: Reviewed and stable  Last Vitals:  Vitals Value Taken Time  BP 125/57 09/04/22 1511  Temp    Pulse 62 09/04/22 1513  Resp 13 09/04/22 1513  SpO2 100 % 09/04/22 1513  Vitals shown include unvalidated device data.  Last Pain:  Vitals:   09/04/22 1334  TempSrc:   PainSc: 0-No pain         Complications: No notable events documented.

## 2022-09-04 NOTE — Anesthesia Postprocedure Evaluation (Signed)
Anesthesia Post Note  Patient: Kathryn Beck  Procedure(s) Performed: AMPUTATION FOOT, serial irrigation (Left: Foot)     Patient location during evaluation: PACU Anesthesia Type: MAC Level of consciousness: awake and alert, patient cooperative and oriented Pain management: pain level controlled Vital Signs Assessment: post-procedure vital signs reviewed and stable Respiratory status: spontaneous breathing, nonlabored ventilation and respiratory function stable Cardiovascular status: stable and blood pressure returned to baseline Postop Assessment: no apparent nausea or vomiting Anesthetic complications: no   No notable events documented.  Last Vitals:  Vitals:   09/04/22 1515 09/04/22 1530  BP: (!) 149/60 (!) 152/58  Pulse: 65 67  Resp: 12 11  Temp:  (!) 36.3 C  SpO2: 100% 95%    Last Pain:  Vitals:   09/04/22 1530  TempSrc:   PainSc: 0-No pain                 Coy Rochford,E. Kathryn Beck

## 2022-09-04 NOTE — Progress Notes (Signed)
   09/04/22 1155  Vitals  Temp 97.8 F (36.6 C)  Pulse Rate 68  Resp 19  BP (!) 159/60  SpO2 100 %  O2 Device Room Air  Weight 79.4 kg  Type of Weight Post-Dialysis  Oxygen Therapy  Patient Activity (if Appropriate) In bed  Pulse Oximetry Type Continuous  Oximetry Probe Site Changed No  Post Treatment  Dialyzer Clearance Lightly streaked  Duration of HD Treatment -hour(s) 3 hour(s)  Hemodialysis Intake (mL) 0 mL  Liters Processed 72  Fluid Removed (mL) 2500 mL  Tolerated HD Treatment Yes  AVG/AVF Arterial Site Held (minutes) 10 minutes  AVG/AVF Venous Site Held (minutes) 10 minutes   Received patient in bed to unit.  Alert and oriented.  Informed consent signed and in chart.   TX duration:3.0  Patient tolerated well.  Transported back to the room  Alert, without acute distress.  Hand-off given to patient's nurse.   Access used: LUAF Access issues: no complications  Total UF removed: 2500 Medication(s) given: None   [facility]

## 2022-09-04 NOTE — Brief Op Note (Signed)
09/04/2022  3:09 PM  PATIENT:  Kathryn Beck  67 y.o. female  PRE-OPERATIVE DIAGNOSIS:  Left foot osteomyeltiis and infection  POST-OPERATIVE DIAGNOSIS:  Left foot osteomyeltiis and infection  PROCEDURE:  Procedure(s) with comments: AMPUTATION FOOT, serial irrigation (Left) - Surgical team to do block  SURGEON:  Surgeon(s) and Role:    * Louann Sjogren, DPM - Primary  PHYSICIAN ASSISTANT:   ASSISTANTS: none   ANESTHESIA:   local and MAC  EBL:  20 cc    BLOOD ADMINISTERED:none  DRAINS: none   LOCAL MEDICATIONS USED:  MARCAINE   , LIDOCAINE , and Amount: 20 ml  SPECIMEN:  Source of Specimen:  Left metatarsals   DISPOSITION OF SPECIMEN:  PATHOLOGY and residual metatarsal for culture   COUNTS:  YES  TOURNIQUET:  * No tourniquets in log *  DICTATION: .Note written in EPIC  PLAN OF CARE: Admit to inpatient   PATIENT DISPOSITION:  PACU - hemodynamically stable.   Delay start of Pharmacological VTE agent (>24hrs) due to surgical blood loss or risk of bleeding: no

## 2022-09-04 NOTE — Op Note (Signed)
OPERATIVE REPORT Patient name: Kathryn Beck MRN: [id number] DOB: [birth year]  DOS:  09/04/22  Preop Dx: Left foot infection  Wound infection after surgery  Status post transmetatarsal amputation of foot, left (HCC) Postop Dx: same  Procedure:  1. Left revision transmetatarsal amputation with irrigation and debridement   Surgeon: Louann Sjogren, DPM  Anesthesia: 50-50 mixture of 2% lidocaine plain with 0.5% Marcaine plain totaling 20 infiltrated in the patient's left  lower extremity  Hemostasis: No TQ necessary   EBL: 20 mL Materials: n/a Injectables: as above Pathology: Left metatarsals for pathology and residual metatarsal for culture   Condition: The patient tolerated the procedure and anesthesia well. No complications noted or reported   Justification for procedure: The patient is a 67 y.o. female who presents today for surgical  treamtent of left transmetatarsal amptuation dehiscence and infection and osteomyeltiis  All conservative modalities of been unsuccessful in providing any sort of satisfactory alleviation of symptoms with the patient. The patient was told benefits as well as possible side effects of the surgery. The patient consented for surgical correction. The patient consent form was reviewed. All patient questions were answered. No guarantees were expressed or implied. The patient and the surgeon both signed the patient consent form with the witness present and placed in the patient's chart.   Procedure in Detail: The patient was brought to the operating room, placed in the operating table in the supine position at which time an aseptic scrub and drape were performed about the patient's respective lower extremity after anesthesia was induced as described above. Attention was then directed to the surgical area where procedure number one commenced.  Procedure #1:   PROCEDURE IN DETAIL:  Attention was directed to the operative extremity with visible site  marking. Incision was opened from previous incision and all staples removed.  Dissection was cared through subcutaneous tissue down to bone. Electrocautery was used to ligate any bleeders.  Next, the bony cuts were made using sagittal saw, and these were mad proximal to previous cuts. The residual metatrsals did feel hard. . Next, the amputated metatatrsal was then removed. Following this, the wound was thoroughly irrigated and then clean area of bone was biopsied for culture. Hemostasis was then obtained After full hemostasis had been obtained, the wound was again thoroughly irrigated and then the dorsal plantar flap was further tailored so that the plantar flap was lying in a dorsal direction. The skin  was closed with interrupted sutures using 3-0 Nylon in a vertical and skin staples.. A Xeroform and a bulky foot dressing were applied, and the patient awakened and transferred to the recovery room in a stable condition.  DISPO: Patient to return to medical floor. Continue IV Abx. Patient will likely need two weeks oral antibtioics upon discharge. Will be difficult to get this to heel so will be NWB and will keep dressing clean dry and intact. Patient will be ok for discharge following preliminary culture results  Louann Sjogren, DPM     Louann Sjogren, DPM [facility]  Dr. Louann Sjogren, DPM    [address] [place], [place] [address]                Office [phone]  Fax [phone]

## 2022-09-04 NOTE — Progress Notes (Signed)
[facility] Progress Note   Subjective:  Seen on HD - doing well. No CP/dyspnea. For surgery this afternoon - debridement and possible further amputation of L foot.  Objective Vitals:   09/04/22 1100 09/04/22 1130 09/04/22 1150 09/04/22 1155  BP: (!) 142/57 (!) 150/59 (!) 161/50 (!) 159/60  Pulse: 66 70 66 68  Resp: 10 13 11 19   Temp:    97.8 F (36.6 C)  TempSrc:      SpO2: 100% 100% 100% 100%  Weight:    79.4 kg  Height:       Physical Exam General: Well appearing woman, NAD. Room air Heart: RRR; 3/6 murmur Lungs: CTA anteriorly Abdomen: soft Extremities: no LE edema, L foot bandaged Dialysis Access:  LUE AVF + bruit  Additional Objective Labs: Basic Metabolic Panel: Recent Labs  Lab 09/03/22 1237 09/04/22 0125 09/04/22 0127  NA 140  --  140  K 3.7  --  3.5  CL 101  --  102  CO2 24  --  24  GLUCOSE 164*  --  246*  BUN 36*  --  53*  CREATININE 5.13*  --  5.95*  CALCIUM 8.6*  --  8.6*  PHOS  --  5.1*  --    Liver Function Tests: Recent Labs  Lab 09/03/22 1237 09/04/22 0125  AST 17 16  ALT 24 22  ALKPHOS 96 96  BILITOT 0.4 0.7  PROT 7.6 6.7  ALBUMIN 3.4* 2.9*   CBC: Recent Labs  Lab 09/03/22 1237 09/04/22 0125  WBC 9.5 9.3  NEUTROABS 6.8 6.5  HGB 8.9* 8.1*  HCT 29.8* 26.5*  MCV 82.5 81.3  PLT 354 350   Blood Culture    Component Value Date/Time   SDES BLOOD SITE NOT SPECIFIED 09/03/2022 1340   SPECREQUEST  09/03/2022 1340    BOTTLES DRAWN AEROBIC AND ANAEROBIC Blood Culture adequate volume   CULT  09/03/2022 1340    NO GROWTH < 24 HOURS Performed at [facility], [address] [place], [place] [address]    REPTSTATUS PENDING 09/03/2022 1340   Studies/Results: MR FOOT LEFT WO CONTRAST  Result Date: 09/03/2022 CLINICAL DATA:  Left transmetatarsal amputation 08/15/2022. Wound infection. History of end-stage renal disease and diabetes. EXAM: MRI OF THE LEFT FOOT WITHOUT CONTRAST TECHNIQUE: Multiplanar,  multisequence MR imaging of the left foot was performed. No intravenous contrast was administered. COMPARISON:  Radiographs 09/03/2022 and 08/13/2022. No previous MRI. FINDINGS: Bones/Joint/Cartilage Postsurgical changes from recent transmetatarsal amputation. Skin staples remain in place, causing some susceptibility artifact. There is nonspecific marrow edema in each of the remaining metatarsals adjacent to the amputation margins. No gross cortical destruction. Mild degenerative changes at the Lisfranc joint without subluxation. No suspicious tarsal bone findings. No significant joint effusions. Ligaments Intact Lisfranc ligament. Muscles and Tendons Nonspecific diffuse muscular edema which may relate to diabetes. No intramuscular fluid collections are demonstrated. Soft tissues Nonspecific ill-defined fluid adjacent to the metatarsal stumps, greatest medially. No well-defined fluid collections are identified. There is mild nonspecific generalized subcutaneous edema. IMPRESSION: 1. Postsurgical changes from recent transmetatarsal amputation. 2. Nonspecific marrow edema in each of the remaining metatarsals adjacent to the amputation margins without gross cortical destruction. This could be reactive or secondary to early osteomyelitis. 3. Nonspecific ill-defined fluid adjacent to the metatarsal stumps, greatest medially. No well-defined fluid collections are identified. 4. Nonspecific diffuse muscular edema which may relate to diabetes. Electronically Signed   By: Carey Bullocks M.D.   On: 09/03/2022 16:02  DG Foot 2 Views Left  Result Date: 09/03/2022 CLINICAL DATA:  Infection.  Open wound. EXAM: LEFT FOOT - 2 VIEW COMPARISON:  Left foot radiographs 08/13/2022 FINDINGS: Interval amputation of the first through fifth digits to the proximal metatarsal shafts. There is bandage material overlying the distal soft tissues with postsurgical skin staples. The surgical bone resection margins appear fairly sharp. The  acuity of the surgery and lack of expected sclerosis limit evaluation for early erosions. On lateral view the distal surgical wound appears to extend to the remaining distal bone. Moderate plantar and posterior calcaneal heel spurs. High-grade atherosclerotic calcifications. Additional distal calf subcutaneous fat lobule calcifications from likely chronic venous stasis, similar to prior. IMPRESSION: 1. Interval amputation of the first through fifth digits to the proximal metatarsal shafts. 2. There is bandage material overlying the distal soft tissues with postsurgical skin staples. The distal surgical wound appears to extend to the remaining distal bone. The surgical bone resection margins appear fairly sharp, and no definitive radiographic evidence of osteomyelitis is seen. Recommend clinical correlation for evidence of infection contacting bone. Electronically Signed   By: Neita Garnet M.D.   On: 09/03/2022 13:53    Medications:  ceFEPime (MAXIPIME) IV 1 g (09/03/22 2247)   vancomycin      aspirin  81 mg Oral Daily   calcitRIOL  0.25 mcg Oral BID WC   Chlorhexidine Gluconate Cloth  6 each Topical Once   Chlorhexidine Gluconate Cloth  6 each Topical Q0600   DULoxetine  60 mg Oral Daily   ezetimibe  10 mg Oral Daily   furosemide  80 mg Oral BID   heparin  5,000 Units Subcutaneous Q12H   insulin aspart  0-5 Units Subcutaneous QHS   insulin aspart  0-9 Units Subcutaneous TID WC   insulin glargine-yfgn  15 Units Subcutaneous Daily   ketotifen  1 drop Both Eyes BID   metoprolol succinate  50 mg Oral Daily   metroNIDAZOLE  500 mg Oral Q12H   pregabalin  50 mg Oral Daily   sertraline  50 mg Oral Daily   sucroferric oxyhydroxide  1,000 mg Oral TID WC   And   sucroferric oxyhydroxide  500 mg Oral Daily    Dialysis Orders: MWF at [facility] 3:45hr, 400/500, EDW 80.5kg, 2K/2.5Ca, UFP#2, LUE AVF, heparin 1000 unit initial + 1000 unit/hr - Sensipar 90mg  q HD, no VDRA - Mircera IV q 2  weeks - last 09/02/22  Assessment/Plan: 1. L foot TMA dehiscence/infection: On Vanc/Cefepime, back to OR today for debridement/possible further amputation 2. ESRD: Continue MWF schedule - s/p HD today, 1.5L UFG 3. HTN/volume: BP slightly high, no edema on exam. Continue home meds, she is slightly below prior EDW. 4. Anemia of ESRD: Hgb 8.1 - given low dose ESA earlier this week, not due until 6/26. Transfuse prn 5. Secondary hyperparathyroidism: Ca/Phos ok - continue  [name] as binder, adjust BMM meds as above. 6. Nutrition:  Alb low, adding protein supp. 7. T2DM  Kathryn Hoyle, PA-C 09/04/2022, 12:30 PM  [facility]

## 2022-09-04 NOTE — Telephone Encounter (Signed)
Phone rings at nurses station with no answer. Several attempts will sign off and await return call.

## 2022-09-04 NOTE — Anesthesia Procedure Notes (Signed)
Procedure Name: MAC Date/Time: 09/04/2022 2:27 PM  Performed by: Waynard Edwards, CRNAPre-anesthesia Checklist: Patient identified, Emergency Drugs available, Suction available and Patient being monitored Patient Re-evaluated:Patient Re-evaluated prior to induction Oxygen Delivery Method: Simple face mask Dental Injury: Teeth and Oropharynx as per pre-operative assessment

## 2022-09-04 NOTE — Progress Notes (Signed)
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  She underwent a left foot TMA and went home with a wound VAC in 1 week was found to the patient developed an infection at the surgical site and she was started on doxycycline twice a day.  She continued to have constant bloody pus coming out of the left surgical wound and has severe neuropathy and does not feel a significant pain associate with a left foot wound.  She had to go see her podiatrist on a routine visit yesterday who evaluated and sent her to the ED for IV antibiotics and possible further surgical intervention given that her left foot continue to bleed and because the wound appeared to dehisce.  She was initiated on IV vancomycin, cefepime and Flagyl and podiatry is consulted and took the patient for a left foot transmetatarsal amputation revision with irrigation and debridement and infectious diseases consulted for further evaluation.  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 There is bandage material overlying the distal soft tissues with postsurgical skin staples. The distal surgical wound appears to  extend to the remaining distal bone. The surgical bone resection margins appear fairly sharp, and no definitive radiographic evidence of osteomyelitis is seen. Recommend clinical correlation for evidence of infection contacting bone." -Discussed with on-call podiatrist PA, who ordered MRI -MRI done and showed "Postsurgical changes from recent transmetatarsal amputation. Nonspecific marrow edema in each of the remaining metatarsals adjacent to the amputation margins without gross cortical destruction. This could be reactive or secondary to early osteomyelitis. Nonspecific ill-defined fluid adjacent to the metatarsal stumps, greatest medially. No well-defined fluid collections are identified. Nonspecific diffuse muscular edema which may relate to diabetes." -WBC Trend, Lactic Acid and Inflammatory Marker Trend: Recent Labs  Lab 08/16/22 0145 08/17/22 0153 08/17/22 0737 08/18/22 0052 08/19/22 0637 09/03/22 1216 09/03/22 1237 09/03/22 1400 09/03/22 1645 09/04/22 0125  WBC 10.2 11.9* 11.9* 11.6* 13.8*  --  9.5  --   --  9.3  LATICACIDVEN  --   --   --   --   --    < >  --  1.5  --   --   CRP  --   --   --   --   --   --   --   --  0.9  --    < > = values in this interval not displayed.  -ESR was 70 -Likelihood of losing left foot is high, briefly discussed with patient family. -Podiatry consulted the patient for a left revision of the transmetatarsal amputation with irrigation and debridement -ID consulted for further evaluation and recommendations   PVD with HX of Right BKA -Underwent extensive treatment by vascular surgery including angiogram, intra-arterial lithotripsy and stenting recently -Continue Aspirin  81 mg po Daily; Plavix and not started for some reason and do not appear to be on her MAR now -Continue Zetia  -Will need to confirm Home Regimen with Pharmacy but appears to have a Statin Allergy so was not initiated in May due to allergy   IDDM with Insulin Resistance and  Neuropathy -Most recent A1c 6.3 -C/w Pregablain 50 mg po Daily -Given her history of ESRD, will cut down long-acting insulin dosage and is now on Semglee 15 units sq Daily -C/w SSI Sensitive Novolog AC/HS -CBG Trend: Recent Labs  Lab 08/20/22 0607 08/20/22 1149 09/03/22 1753 09/03/22 2038 09/04/22 0739 09/04/22 1254 09/04/22 1319  GLUCAP 126* 237* 201* 287* 156* 88 88    HTN -Continue Metoprolol Succinate 50 mg po Daily -Continue to Monitor BP per Protocol -Last BP reading was 154/60  HLD -C/w Ezetimibe 10 mg po Daily  ESRD on HD MWF -BUN/Cr Trend: Recent Labs  Lab 08/14/22 0857 08/15/22 0612 08/17/22 0737 08/19/22 0637 09/03/22 1237 09/04/22 0127 09/04/22 1345  BUN 31* 13 43* 38* 36* 53* 26*  CREATININE 6.27* 3.62* 8.02* 7.11* 5.13* 5.95* 3.40*  -Nephrology consulted for further evaluation and patient is being dialyzed today -C/w Cinacalcet 90 mg po TIDwm and Furosemide 80 mg po BID as well as Sucroferric Oxyhydroxide 1000 mg po TIDwm and 500 mg po Custom -Avoid Nephrotoxic Medications, Contrast Dyes, Hypotension and Dehydration to Ensure Adequate Renal Perfusion and will need to Renally Adjust Meds -Continue to Monitor and Trend Renal Function carefully and repeat CMP in the AM   Normocytic Anemia/Anemia of Chronic Disease -Hgb/Hct Trend: Recent Labs  Lab 08/17/22 0153 08/17/22 0737 08/18/22 0052 08/19/22 0637 09/03/22 1237 09/04/22 0125 09/04/22 1345  HGB 8.0* 7.9* 8.2* 7.9* 8.9* 8.1* 9.9*  HCT 27.3* 26.9* 27.4* 26.0* 29.8* 26.5* 29.0*  MCV 83.5 81.8 80.6 80.7 82.5 81.3  --   -Check Anemia Panel in the AM -Continue to Monitor for S/Sx of Bleeding; no overt bleeding noted -Repeat CBC in the AM  Depression and Anxiety -C/w Sertraline 50 mg po Daily  Hypoalbuminemia -Patient's Albumin Trend: Recent Labs  Lab 08/13/22 1452 08/14/22 0857 08/15/22 0612 08/17/22 0737 08/19/22 0637 09/03/22 1237 09/04/22 0125  ALBUMIN 3.1* 3.0* 2.7* 2.9* 2.6* 3.4*  2.9*  -Continue to Monitor and Trend and repeat CMP in the AM  Obesity -Complicates overall prognosis and care -Estimated body mass index is 30.05 kg/m as calculated from the following:   Height as of this encounter: 5\' 4"  (1.626 m).   Weight as of this encounter: 79.4 kg.  -Weight Loss and Dietary Counseling given   DVT prophylaxis: SCD's Start: 09/04/22 0112 heparin injection 5,000 Units Start: 09/03/22 1530    Code Status: Full Code Family Communication: No family present at bedside  Disposition Plan:  Level of care: Med-Surg Status is: Inpatient Remains inpatient appropriate because: Undergoing surgical TMA revision and needs Abx Management    Consultants:  Infectious Diseases Podiatry  Procedures:  Procedure:  1. Left revision transmetatarsal amputation with irrigation and debridement   Antimicrobials:  Anti-infectives (From admission, onward)    Start     Dose/Rate Route Frequency Ordered Stop   09/04/22 1200  [MAR Hold]  vancomycin (VANCOREADY) IVPB 750 mg/150 mL        (MAR Hold since Fri 09/04/2022 at 1306.Hold Reason: Transfer to a Procedural area)   750 mg 150 mL/hr over 60 Minutes Intravenous Every M-W-F (Hemodialysis) 09/03/22 1415     09/03/22 2200  metroNIDAZOLE (FLAGYL) tablet  500 mg  Status:  Discontinued        500 mg Oral Every 12 hours 09/03/22 1520 09/03/22 1520   09/03/22 2000  [MAR Hold]  ceFEPIme (MAXIPIME) 1 g in sodium chloride 0.9 % 100 mL IVPB        (MAR Hold since Fri 09/04/2022 at 1306.Hold Reason: Transfer to a Procedural area)   1 g 200 mL/hr over 30 Minutes Intravenous Every 24 hours 09/03/22 1415     09/03/22 1315  vancomycin (VANCOREADY) IVPB 2000 mg/400 mL        2,000 mg 200 mL/hr over 120 Minutes Intravenous  Once 09/03/22 1304 09/03/22 2222   09/03/22 1315  ceFEPIme (MAXIPIME) 2 g in sodium chloride 0.9 % 100 mL IVPB        2 g 200 mL/hr over 30 Minutes Intravenous  Once 09/03/22 1304 09/03/22 1817   09/03/22 1300  [MAR Hold]   metroNIDAZOLE (FLAGYL) tablet 500 mg        (MAR Hold since Fri 09/04/2022 at 1306.Hold Reason: Transfer to a Procedural area)   500 mg Oral Every 12 hours 09/03/22 1251 09/10/22 0959       Subjective: Seen and examined at bedside in the dialysis unit and states that her left wound opened up and started worsening.  She states that is also part of bleeding.  Feels okay and waiting for surgical intervention and states that she is going later this afternoon.  No nausea or vomiting.  Denies any lightheadedness or dizziness.  Objective: Vitals:   09/04/22 1155 09/04/22 1311 09/04/22 1512 09/04/22 1515  BP: (!) 159/60 (!) 154/60  (!) 149/60  Pulse: 68 66  65  Resp: 19 20  12   Temp: 97.8 F (36.6 C) 98 F (36.7 C) (!) 97.4 F (36.3 C)   TempSrc:  Oral    SpO2: 100% 97%  100%  Weight: 79.4 kg     Height:        Intake/Output Summary (Last 24 hours) at 09/04/2022 1527 Last data filed at 09/04/2022 1501 Gross per 24 hour  Intake 480.34 ml  Output 2505 ml  Net -2024.66 ml   Filed Weights   09/03/22 1235 09/04/22 0838 09/04/22 1155  Weight: 78.9 kg 81.9 kg 79.4 kg   Examination: Physical Exam:  Constitutional: WN/WD obese African-American female in no acute distress appears, in the dialysis bed Respiratory: Diminished to auscultation bilaterally, no wheezing, rales, rhonchi or crackles. Normal respiratory effort and patient is not tachypenic. No accessory muscle use.  Cardiovascular: RRR, no murmurs / rubs / gallops. S1 and S2 auscultated.  Has a right BKA and left TMA wound that looks dehisced Abdomen: Soft, non-tender, distended secondary to body habitus. Bowel sounds positive.  GU: Deferred. Musculoskeletal: No clubbing / cyanosis of digits/nails.  The right BKA and a left foot TMA Skin: Left TMA wound noted Neurologic: CN 2-12 grossly intact with no focal deficits. Romberg sign and cerebellar reflexes not assessed.  Psychiatric: Normal judgment and insight. Alert and oriented x 3.  Normal mood and appropriate affect.   Data Reviewed: I have personally reviewed following labs and imaging studies  CBC: Recent Labs  Lab 09/03/22 1237 09/04/22 0125 09/04/22 1345  WBC 9.5 9.3  --   NEUTROABS 6.8 6.5  --   HGB 8.9* 8.1* 9.9*  HCT 29.8* 26.5* 29.0*  MCV 82.5 81.3  --   PLT 354 350  --    Basic Metabolic Panel: Recent Labs  Lab 09/03/22 1237 09/04/22 0125  09/04/22 0127 09/04/22 1345  NA 140  --  140 139  K 3.7  --  3.5 3.5  CL 101  --  102 97*  CO2 24  --  24  --   GLUCOSE 164*  --  246* 95  BUN 36*  --  53* 26*  CREATININE 5.13*  --  5.95* 3.40*  CALCIUM 8.6*  --  8.6*  --   MG  --  2.1  --   --   PHOS  --  5.1*  --   --    GFR: Estimated Creatinine Clearance: 16.6 mL/min (A) (by C-G formula based on SCr of 3.4 mg/dL (H)). Liver Function Tests: Recent Labs  Lab 09/03/22 1237 09/04/22 0125  AST 17 16  ALT 24 22  ALKPHOS 96 96  BILITOT 0.4 0.7  PROT 7.6 6.7  ALBUMIN 3.4* 2.9*   No results for input(s): "LIPASE", "AMYLASE" in the last 168 hours. No results for input(s): "AMMONIA" in the last 168 hours. Coagulation Profile: No results for input(s): "INR", "PROTIME" in the last 168 hours. Cardiac Enzymes: No results for input(s): "CKTOTAL", "CKMB", "CKMBINDEX", "TROPONINI" in the last 168 hours. BNP (last 3 results) No results for input(s): "PROBNP" in the last 8760 hours. HbA1C: No results for input(s): "HGBA1C" in the last 72 hours. CBG: Recent Labs  Lab 09/03/22 2038 09/04/22 0739 09/04/22 1254 09/04/22 1319 09/04/22 1512  GLUCAP 287* 156* 88 88 93   Lipid Profile: No results for input(s): "CHOL", "HDL", "LDLCALC", "TRIG", "CHOLHDL", "LDLDIRECT" in the last 72 hours. Thyroid Function Tests: No results for input(s): "TSH", "T4TOTAL", "FREET4", "T3FREE", "THYROIDAB" in the last 72 hours. Anemia Panel: No results for input(s): "VITAMINB12", "FOLATE", "FERRITIN", "TIBC", "IRON", "RETICCTPCT" in the last 72 hours. Sepsis  Labs: Recent Labs  Lab 09/03/22 1216 09/03/22 1400  LATICACIDVEN 1.8 1.5    Recent Results (from the past 240 hour(s))  Blood Cultures x 2 sites     Status: None (Preliminary result)   Collection Time: 09/03/22  1:30 PM   Specimen: BLOOD  Result Value Ref Range Status   Specimen Description BLOOD SITE NOT SPECIFIED  Final   Special Requests   Final    BOTTLES DRAWN AEROBIC AND ANAEROBIC Blood Culture results may not be optimal due to an inadequate volume of blood received in culture bottles   Culture   Final    NO GROWTH < 24 HOURS Performed at [facility], [address], [place], [place] [address]    Report Status PENDING  Incomplete  Blood Cultures x 2 sites     Status: None (Preliminary result)   Collection Time: 09/03/22  1:40 PM   Specimen: BLOOD  Result Value Ref Range Status   Specimen Description BLOOD SITE NOT SPECIFIED  Final   Special Requests   Final    BOTTLES DRAWN AEROBIC AND ANAEROBIC Blood Culture adequate volume   Culture   Final    NO GROWTH < 24 HOURS Performed at [facility], [address] [place], [place] [address]    Report Status PENDING  Incomplete  Surgical pcr screen     Status: None   Collection Time: 09/04/22  5:01 AM   Specimen: Nasal Mucosa; Nasal Swab  Result Value Ref Range Status   MRSA, PCR NEGATIVE NEGATIVE Final   Staphylococcus aureus NEGATIVE NEGATIVE Final    Comment: (NOTE) The Xpert SA Assay (FDA approved for NASAL specimens in patients 31 years of age and older), is  one component of a comprehensive surveillance program. It is not intended to diagnose infection nor to guide or monitor treatment. Performed at [facility], [address], [place], [place] [address]     Radiology Studies: MR FOOT LEFT WO CONTRAST  Result Date: 09/03/2022 CLINICAL DATA:  Left transmetatarsal amputation 08/15/2022. Wound infection. History of end-stage renal disease and diabetes. EXAM: MRI OF THE LEFT FOOT WITHOUT CONTRAST  TECHNIQUE: Multiplanar, multisequence MR imaging of the left foot was performed. No intravenous contrast was administered. COMPARISON:  Radiographs 09/03/2022 and 08/13/2022. No previous MRI. FINDINGS: Bones/Joint/Cartilage Postsurgical changes from recent transmetatarsal amputation. Skin staples remain in place, causing some susceptibility artifact. There is nonspecific marrow edema in each of the remaining metatarsals adjacent to the amputation margins. No gross cortical destruction. Mild degenerative changes at the Lisfranc joint without subluxation. No suspicious tarsal bone findings. No significant joint effusions. Ligaments Intact Lisfranc ligament. Muscles and Tendons Nonspecific diffuse muscular edema which may relate to diabetes. No intramuscular fluid collections are demonstrated. Soft tissues Nonspecific ill-defined fluid adjacent to the metatarsal stumps, greatest medially. No well-defined fluid collections are identified. There is mild nonspecific generalized subcutaneous edema. IMPRESSION: 1. Postsurgical changes from recent transmetatarsal amputation. 2. Nonspecific marrow edema in each of the remaining metatarsals adjacent to the amputation margins without gross cortical destruction. This could be reactive or secondary to early osteomyelitis. 3. Nonspecific ill-defined fluid adjacent to the metatarsal stumps, greatest medially. No well-defined fluid collections are identified. 4. Nonspecific diffuse muscular edema which may relate to diabetes. Electronically Signed   By: Carey Bullocks M.D.   On: 09/03/2022 16:02   DG Foot 2 Views Left  Result Date: 09/03/2022 CLINICAL DATA:  Infection.  Open wound. EXAM: LEFT FOOT - 2 VIEW COMPARISON:  Left foot radiographs 08/13/2022 FINDINGS: Interval amputation of the first through fifth digits to the proximal metatarsal shafts. There is bandage material overlying the distal soft tissues with postsurgical skin staples. The surgical bone resection margins  appear fairly sharp. The acuity of the surgery and lack of expected sclerosis limit evaluation for early erosions. On lateral view the distal surgical wound appears to extend to the remaining distal bone. Moderate plantar and posterior calcaneal heel spurs. High-grade atherosclerotic calcifications. Additional distal calf subcutaneous fat lobule calcifications from likely chronic venous stasis, similar to prior. IMPRESSION: 1. Interval amputation of the first through fifth digits to the proximal metatarsal shafts. 2. There is bandage material overlying the distal soft tissues with postsurgical skin staples. The distal surgical wound appears to extend to the remaining distal bone. The surgical bone resection margins appear fairly sharp, and no definitive radiographic evidence of osteomyelitis is seen. Recommend clinical correlation for evidence of infection contacting bone. Electronically Signed   By: Neita Garnet M.D.   On: 09/03/2022 13:53    Scheduled Meds:  [MAR Hold] (feeding supplement) PROSource Plus  30 mL Oral BID BM   [MAR Hold] aspirin  81 mg Oral Daily   Chlorhexidine Gluconate Cloth  6 each Topical Once   [MAR Hold] Chlorhexidine Gluconate Cloth  6 each Topical Q0600   [MAR Hold] cinacalcet  90 mg Oral Once per day on Mon Wed Fri   [facility] Hold] DULoxetine  60 mg Oral Daily   [MAR Hold] ezetimibe  10 mg Oral Daily   [MAR Hold] furosemide  80 mg Oral BID   [MAR Hold] heparin  5,000 Units Subcutaneous Q12H   [MAR Hold] insulin aspart  0-5 Units Subcutaneous QHS   [MAR Hold]  insulin aspart  0-9 Units Subcutaneous TID WC   [MAR Hold] insulin glargine-yfgn  15 Units Subcutaneous Daily   [MAR Hold] ketotifen  1 drop Both Eyes BID   [MAR Hold] metoprolol succinate  50 mg Oral Daily   [MAR Hold] metroNIDAZOLE  500 mg Oral Q12H   [MAR Hold] pregabalin  50 mg Oral Daily   [MAR Hold] sertraline  50 mg Oral Daily   [MAR Hold] sucroferric oxyhydroxide  1,000 mg Oral TID WC   And   [MAR Hold]  sucroferric oxyhydroxide  500 mg Oral Daily   Continuous Infusions:  sodium chloride 10 mL/hr at 09/04/22 1423   [MAR Hold] ceFEPime (MAXIPIME) IV 1 g (09/03/22 2247)   [MAR Hold] vancomycin      LOS: 1 day   Marguerita Merles, DO [facility] Available via Epic secure chat 7am-7pm After these hours, please refer to coverage provider listed on [email or web address] 09/04/2022, 3:27 PM

## 2022-09-04 NOTE — Progress Notes (Signed)
Pt receives out-pt HD at [facility] on MWF. Pt arrives at 6:15 am for 6:30 am chair time. Pt from snf per clinic staff. Will assist as needed.   Olivia Canter Renal Navigator [phone]

## 2022-09-04 NOTE — Consult Note (Signed)
[facility]  Total days of antibiotics 2       Reason for Consult: dfu/osteomyelitis management    Referring Physician: sheikh  Principal Problem:   Osteomyelitis (HCC) Active Problems:   Chronic kidney disease, stage 4 (severe) (HCC)   Anemia of chronic disease   ESRD on hemodialysis (HCC)   Acute osteomyelitis of toe of left foot (HCC)   Left foot infection   Status post transmetatarsal amputation of foot, left (HCC)   Wound infection after surgery    HPI: Kathryn Beck is a 67 y.o. female with ESRD on HD with hx of right leg BKA, and recent left foot/TMA  on 5/25 but has been on oral abx for roughly a week since wound dehiscence and localized infection. She ws seen at podiatrist office on 6/13 with worsening purulent drainage necessitating  I x D on 6/14. She underwent mri that showed nonspecific marrow edema to remaining metatarsals adjacent to the amputation margins. Cultures sent from the OR. No other culture results on past history. She was started on iv vancomycin and cefepime with hd. And oral metronidazole.  She has vascular intervention on 07/21/22 to improve blood flow to left leg.  Past Medical History:  Diagnosis Date   Acid reflux    Anemia    Arthritis    Axillary masses    Soft tissue - status post excision   Back pain    COVID-19 virus infection 04/06/2019   Depression    End-stage renal disease (HCC)    M/W/F dialysis   Essential hypertension    Headache    years ago   History of blood transfusion    History of cardiac catheterization    Normal coronary arteries October 2020   History of claustrophobia    History of pneumonia 2019   Hypoxia 04/03/2019   Memory loss    Mixed hyperlipidemia    Obesity    Pancreatitis    Peritoneal dialysis catheter in place [facility])    Pneumonia due to COVID-19 virus 04/02/2019   Sleep apnea    Noncompliant with CPAP   Stroke (HCC)    mini stroke   Type 2 diabetes mellitus (HCC)      Allergies:  Allergies  Allergen Reactions   Ace Inhibitors Anaphylaxis and Swelling   Penicillins Itching, Swelling and Other (See Comments)    Did it involve swelling of the face/tongue/throat, SOB, or low BP? Unknown Did it involve sudden or severe rash/hives, skin peeling, or any reaction on the inside of your mouth or nose? Unknown Did you need to seek medical attention at a hospital or doctor's office? Unknown When did it last happen?      years  If all above answers are "NO", may proceed with cephalosporin use.    Statins Other (See Comments)    elevated LFT's     Albuterol Swelling    MEDICATIONS:  (feeding supplement) PROSource Plus  30 mL Oral BID BM   aspirin  81 mg Oral Daily   Chlorhexidine Gluconate Cloth  6 each Topical Q0600   cinacalcet  90 mg Oral Once per day on Mon Wed Fri   DULoxetine  60 mg Oral Daily   ezetimibe  10 mg Oral Daily   furosemide  80 mg Oral BID   heparin  5,000 Units Subcutaneous Q12H   insulin aspart  0-5 Units Subcutaneous QHS   insulin aspart  0-9 Units Subcutaneous TID WC   insulin glargine-yfgn  15 Units Subcutaneous Daily   ketotifen  1 drop Both Eyes BID   metoprolol succinate  50 mg Oral Daily   metroNIDAZOLE  500 mg Oral Q12H   pregabalin  50 mg Oral Daily   sertraline  50 mg Oral Daily   sucroferric oxyhydroxide  1,000 mg Oral TID WC   And   sucroferric oxyhydroxide  500 mg Oral Daily    Social History   Tobacco Use   Smoking status: Never    Passive exposure: Never   Smokeless tobacco: Never   Tobacco comments:    Verified by Daughter, [name]  Vaping Use   Vaping Use: Never used  Substance Use Topics   Alcohol use: No   Drug use: No    Family History  Problem Relation Age of Onset   Hypertension Father    Hypercholesterolemia Father    Arthritis Father    Hypertension Sister    Hypercholesterolemia Sister    Breast cancer Sister    Hypertension Sister    Colon cancer Neg Hx    Colon polyps  Neg Hx     Review of Systems -  +left foot wound. 12 point ros is otherwise negative  OBJECTIVE: Temp:  [97.1 F (36.2 C)-98 F (36.7 C)] 97.4 F (36.3 C) (06/14 1530) Pulse Rate:  [65-82] 68 (06/14 1600) Resp:  [10-20] 13 (06/14 1600) BP: (139-177)/(50-74) 150/64 (06/14 1600) SpO2:  [95 %-100 %] 98 % (06/14 1600) Weight:  [79.4 kg-81.9 kg] 79.4 kg (06/14 1155) Physical Exam  Constitutional:  oriented to person, place, and time. appears well-developed and well-nourished. No distress.  HENT: /AT, PERRLA, no scleral icterus Mouth/Throat: Oropharynx is clear and moist. No oropharyngeal exudate.  Cardiovascular: Normal rate, regular rhythm and normal heart sounds. Exam reveals no gallop and no friction rub.  No murmur heard.  Pulmonary/Chest: Effort normal and breath sounds normal. No respiratory distress.  has no wheezes.  Neck = supple, no nuchal rigidity Abdominal: Soft. Bowel sounds are normal.  exhibits no distension. There is no tenderness.  Lymphadenopathy: no cervical adenopathy. No axillary adenopathy Ext: right bka and left TMA -wrapped Psychiatric: a normal mood and affect.  behavior is normal.    LABS: Results for orders placed or performed during the hospital encounter of 09/03/22 (from the past 48 hour(s))  Lactic acid, plasma     Status: None   Collection Time: 09/03/22 12:16 PM  Result Value Ref Range   Lactic Acid, Venous 1.8 0.5 - 1.9 mmol/L    Comment: Performed at [facility], [address], [place], [place] [address]  Comprehensive metabolic panel     Status: Abnormal   Collection Time: 09/03/22 12:37 PM  Result Value Ref Range   Sodium 140 135 - 145 mmol/L   Potassium 3.7 3.5 - 5.1 mmol/L   Chloride 101 98 - 111 mmol/L   CO2 24 22 - 32 mmol/L   Glucose, Bld 164 (H) 70 - 99 mg/dL    Comment: Glucose reference range applies only to samples taken after fasting for at least 8 hours.   BUN 36 (H) 8 - 23 mg/dL   Creatinine, Ser 6.04 (H) 0.44 - 1.00  mg/dL   Calcium 8.6 (L) 8.9 - 10.3 mg/dL   Total Protein 7.6 6.5 - 8.1 g/dL   Albumin 3.4 (L) 3.5 - 5.0 g/dL   AST 17 15 - 41 U/L   ALT 24 0 - 44 U/L   Alkaline Phosphatase 96 38 - 126  U/L   Total Bilirubin 0.4 0.3 - 1.2 mg/dL   GFR, Estimated 9 (L) >60 mL/min    Comment: (NOTE) Calculated using the CKD-EPI Creatinine Equation (2021)    Anion gap 15 5 - 15    Comment: Performed at [facility], [address] [place], [place] [address]  CBC with Differential     Status: Abnormal   Collection Time: 09/03/22 12:37 PM  Result Value Ref Range   WBC 9.5 4.0 - 10.5 K/uL   RBC 3.61 (L) 3.87 - 5.11 MIL/uL   Hemoglobin 8.9 (L) 12.0 - 15.0 g/dL   HCT 60.4 (L) 54.0 - 98.1 %   MCV 82.5 80.0 - 100.0 fL   MCH 24.7 (L) 26.0 - 34.0 pg   MCHC 29.9 (L) 30.0 - 36.0 g/dL   RDW 19.1 (H) 47.8 - 29.5 %   Platelets 354 150 - 400 K/uL    Comment: REPEATED TO VERIFY   nRBC 0.2 0.0 - 0.2 %   Neutrophils Relative % 72 %   Neutro Abs 6.8 1.7 - 7.7 K/uL   Lymphocytes Relative 20 %   Lymphs Abs 1.9 0.7 - 4.0 K/uL   Monocytes Relative 7 %   Monocytes Absolute 0.7 0.1 - 1.0 K/uL   Eosinophils Relative 1 %   Eosinophils Absolute 0.1 0.0 - 0.5 K/uL   Basophils Relative 0 %   Basophils Absolute 0.0 0.0 - 0.1 K/uL   Immature Granulocytes 0 %   Abs Immature Granulocytes 0.04 0.00 - 0.07 K/uL    Comment: Performed at [facility], [address] [place], [place] [address]  Blood Cultures x 2 sites     Status: None (Preliminary result)   Collection Time: 09/03/22  1:30 PM   Specimen: BLOOD  Result Value Ref Range   Specimen Description BLOOD SITE NOT SPECIFIED    Special Requests      BOTTLES DRAWN AEROBIC AND ANAEROBIC Blood Culture results may not be optimal due to an inadequate volume of blood received in culture bottles   Culture      NO GROWTH < 24 HOURS Performed at [facility], [address] [place], [place] [address]    Report Status PENDING   Blood Cultures x 2 sites      Status: None (Preliminary result)   Collection Time: 09/03/22  1:40 PM   Specimen: BLOOD  Result Value Ref Range   Specimen Description BLOOD SITE NOT SPECIFIED    Special Requests      BOTTLES DRAWN AEROBIC AND ANAEROBIC Blood Culture adequate volume   Culture      NO GROWTH < 24 HOURS Performed at [facility], [address] [place], [place] [address]    Report Status PENDING   Lactic acid, plasma     Status: None   Collection Time: 09/03/22  2:00 PM  Result Value Ref Range   Lactic Acid, Venous 1.5 0.5 - 1.9 mmol/L    Comment: Performed at [facility], [address], [place], [place] [address]  Sedimentation rate     Status: Abnormal   Collection Time: 09/03/22  4:45 PM  Result Value Ref Range   Sed Rate 70 (H) 0 - 22 mm/hr    Comment: Performed at [facility], [address], [place], [place] [address]  C-reactive protein     Status: None   Collection Time: 09/03/22  4:45 PM  Result Value Ref Range  CRP 0.9 <1.0 mg/dL    Comment: Performed at [facility], [address] [place], [place] [address]  Glucose, capillary     Status: Abnormal   Collection Time: 09/03/22  5:53 PM  Result Value Ref Range   Glucose-Capillary 201 (H) 70 - 99 mg/dL    Comment: Glucose reference range applies only to samples taken after fasting for at least 8 hours.  Glucose, capillary     Status: Abnormal   Collection Time: 09/03/22  8:38 PM  Result Value Ref Range   Glucose-Capillary 287 (H) 70 - 99 mg/dL    Comment: Glucose reference range applies only to samples taken after fasting for at least 8 hours.  CBC with Differential/Platelet     Status: Abnormal   Collection Time: 09/04/22  1:25 AM  Result Value Ref Range   WBC 9.3 4.0 - 10.5 K/uL   RBC 3.26 (L) 3.87 - 5.11 MIL/uL   Hemoglobin 8.1 (L) 12.0 - 15.0 g/dL   HCT 46.9 (L) 62.9 - 52.8 %   MCV 81.3 80.0 - 100.0 fL   MCH 24.8 (L) 26.0 - 34.0 pg   MCHC 30.6 30.0 - 36.0 g/dL   RDW 41.3 (H) 24.4 - 01.0 %    Platelets 350 150 - 400 K/uL   nRBC 0.2 0.0 - 0.2 %   Neutrophils Relative % 70 %   Neutro Abs 6.5 1.7 - 7.7 K/uL   Lymphocytes Relative 20 %   Lymphs Abs 1.9 0.7 - 4.0 K/uL   Monocytes Relative 8 %   Monocytes Absolute 0.8 0.1 - 1.0 K/uL   Eosinophils Relative 1 %   Eosinophils Absolute 0.1 0.0 - 0.5 K/uL   Basophils Relative 0 %   Basophils Absolute 0.0 0.0 - 0.1 K/uL   Immature Granulocytes 1 %   Abs Immature Granulocytes 0.05 0.00 - 0.07 K/uL    Comment: Performed at [facility], [address], [place], [place] [address]  Hepatic function panel     Status: Abnormal   Collection Time: 09/04/22  1:25 AM  Result Value Ref Range   Total Protein 6.7 6.5 - 8.1 g/dL   Albumin 2.9 (L) 3.5 - 5.0 g/dL   AST 16 15 - 41 U/L   ALT 22 0 - 44 U/L   Alkaline Phosphatase 96 38 - 126 U/L   Total Bilirubin 0.7 0.3 - 1.2 mg/dL   Bilirubin, Direct <6.6 0.0 - 0.2 mg/dL   Indirect Bilirubin NOT CALCULATED 0.3 - 0.9 mg/dL    Comment: Performed at [facility], [address] [place], [place] [address]  Magnesium     Status: None   Collection Time: 09/04/22  1:25 AM  Result Value Ref Range   Magnesium 2.1 1.7 - 2.4 mg/dL    Comment: Performed at [facility], [address] [place], [place] [address]  Phosphorus     Status: Abnormal   Collection Time: 09/04/22  1:25 AM  Result Value Ref Range   Phosphorus 5.1 (H) 2.5 - 4.6 mg/dL    Comment: Performed at [facility], [address], [place], [place] [address]  Basic metabolic panel     Status: Abnormal   Collection Time: 09/04/22  1:27 AM  Result Value Ref Range   Sodium 140 135 - 145 mmol/L   Potassium 3.5 3.5 - 5.1 mmol/L   Chloride 102 98 - 111 mmol/L   CO2 24 22 - 32 mmol/L  Glucose, Bld 246 (H) 70 - 99 mg/dL    Comment: Glucose reference range applies only to samples taken after fasting for at least 8 hours.   BUN 53 (H) 8 - 23 mg/dL   Creatinine, Ser 1.61 (H) 0.44 - 1.00 mg/dL   Calcium 8.6 (L) 8.9 - 10.3  mg/dL   GFR, Estimated 7 (L) >60 mL/min    Comment: (NOTE) Calculated using the CKD-EPI Creatinine Equation (2021)    Anion gap 14 5 - 15    Comment: Performed at [facility] Lab, [address] [place], [place] [address]  Surgical pcr screen     Status: None   Collection Time: 09/04/22  5:01 AM   Specimen: Nasal Mucosa; Nasal Swab  Result Value Ref Range   MRSA, PCR NEGATIVE NEGATIVE   Staphylococcus aureus NEGATIVE NEGATIVE    Comment: (NOTE) The Xpert SA Assay (FDA approved for NASAL specimens in patients 42 years of age and older), is one component of a comprehensive surveillance program. It is not intended to diagnose infection nor to guide or monitor treatment. Performed at [facility], [address] [place], [place] [address]   Glucose, capillary     Status: Abnormal   Collection Time: 09/04/22  7:39 AM  Result Value Ref Range   Glucose-Capillary 156 (H) 70 - 99 mg/dL    Comment: Glucose reference range applies only to samples taken after fasting for at least 8 hours.  Glucose, capillary     Status: None   Collection Time: 09/04/22 12:54 PM  Result Value Ref Range   Glucose-Capillary 88 70 - 99 mg/dL    Comment: Glucose reference range applies only to samples taken after fasting for at least 8 hours.  Glucose, capillary     Status: None   Collection Time: 09/04/22  1:19 PM  Result Value Ref Range   Glucose-Capillary 88 70 - 99 mg/dL    Comment: Glucose reference range applies only to samples taken after fasting for at least 8 hours.  I-STAT, chem 8     Status: Abnormal   Collection Time: 09/04/22  1:45 PM  Result Value Ref Range   Sodium 139 135 - 145 mmol/L   Potassium 3.5 3.5 - 5.1 mmol/L   Chloride 97 (L) 98 - 111 mmol/L   BUN 26 (H) 8 - 23 mg/dL   Creatinine, Ser 1.19 (H) 0.44 - 1.00 mg/dL   Glucose, Bld 95 70 - 99 mg/dL    Comment: Glucose reference range applies only to samples taken after fasting for at least 8 hours.   Calcium, Ion 1.14 (L)  1.15 - 1.40 mmol/L   TCO2 35 (H) 22 - 32 mmol/L   Hemoglobin 9.9 (L) 12.0 - 15.0 g/dL   HCT 14.7 (L) 82.9 - 56.2 %  Glucose, capillary     Status: None   Collection Time: 09/04/22  3:12 PM  Result Value Ref Range   Glucose-Capillary 93 70 - 99 mg/dL    Comment: Glucose reference range applies only to samples taken after fasting for at least 8 hours.   *Note: Due to a large number of results and/or encounters for the requested time period, some results have not been displayed. A complete set of results can be found in Results Review.    MICRO:  IMAGING: MR FOOT LEFT WO CONTRAST  Result Date: 09/03/2022 CLINICAL DATA:  Left transmetatarsal amputation 08/15/2022. Wound infection. History of end-stage renal disease and diabetes. EXAM: MRI OF THE LEFT  FOOT WITHOUT CONTRAST TECHNIQUE: Multiplanar, multisequence MR imaging of the left foot was performed. No intravenous contrast was administered. COMPARISON:  Radiographs 09/03/2022 and 08/13/2022. No previous MRI. FINDINGS: Bones/Joint/Cartilage Postsurgical changes from recent transmetatarsal amputation. Skin staples remain in place, causing some susceptibility artifact. There is nonspecific marrow edema in each of the remaining metatarsals adjacent to the amputation margins. No gross cortical destruction. Mild degenerative changes at the Lisfranc joint without subluxation. No suspicious tarsal bone findings. No significant joint effusions. Ligaments Intact Lisfranc ligament. Muscles and Tendons Nonspecific diffuse muscular edema which may relate to diabetes. No intramuscular fluid collections are demonstrated. Soft tissues Nonspecific ill-defined fluid adjacent to the metatarsal stumps, greatest medially. No well-defined fluid collections are identified. There is mild nonspecific generalized subcutaneous edema. IMPRESSION: 1. Postsurgical changes from recent transmetatarsal amputation. 2. Nonspecific marrow edema in each of the remaining metatarsals  adjacent to the amputation margins without gross cortical destruction. This could be reactive or secondary to early osteomyelitis. 3. Nonspecific ill-defined fluid adjacent to the metatarsal stumps, greatest medially. No well-defined fluid collections are identified. 4. Nonspecific diffuse muscular edema which may relate to diabetes. Electronically Signed   By: Carey Bullocks M.D.   On: 09/03/2022 16:02   DG Foot 2 Views Left  Result Date: 09/03/2022 CLINICAL DATA:  Infection.  Open wound. EXAM: LEFT FOOT - 2 VIEW COMPARISON:  Left foot radiographs 08/13/2022 FINDINGS: Interval amputation of the first through fifth digits to the proximal metatarsal shafts. There is bandage material overlying the distal soft tissues with postsurgical skin staples. The surgical bone resection margins appear fairly sharp. The acuity of the surgery and lack of expected sclerosis limit evaluation for early erosions. On lateral view the distal surgical wound appears to extend to the remaining distal bone. Moderate plantar and posterior calcaneal heel spurs. High-grade atherosclerotic calcifications. Additional distal calf subcutaneous fat lobule calcifications from likely chronic venous stasis, similar to prior. IMPRESSION: 1. Interval amputation of the first through fifth digits to the proximal metatarsal shafts. 2. There is bandage material overlying the distal soft tissues with postsurgical skin staples. The distal surgical wound appears to extend to the remaining distal bone. The surgical bone resection margins appear fairly sharp, and no definitive radiographic evidence of osteomyelitis is seen. Recommend clinical correlation for evidence of infection contacting bone. Electronically Signed   By: Neita Garnet M.D.   On: 09/03/2022 13:53     Assessment/Plan:  12BJ F with ESRD on HD, hx of left foot osteomyelitis s/p TMA now with wound dehiscence, requiring revision for probable expansion of localized osteomyelitis  -  continue on vancomycin, cefepime per hd, and oral metronidazole 500mg  po bid - will follow up on culture results, to either continue with hd vs oral abtx. - recommending more than 2 wks if we don't have clean margins. - will check sed rate and crp.  Dr Gershon Mussel vu will follow up on cultures over the weekend. Will see on monday  Lamoyne Hessel B. Drue Second MD MPH [facility] [phone]

## 2022-09-04 NOTE — Interval H&P Note (Signed)
History and Physical Interval Note:  09/04/2022 2:17 PM  Kathryn Beck  has presented today for surgery, with the diagnosis of Left foot osteomyeltiis and infection.  The various methods of treatment have been discussed with the patient and family. After consideration of risks, benefits and other options for treatment, the patient has consented to  Procedure(s) with comments: AMPUTATION FOOT, serial irrigation (Left) - Surgical team to do block as a surgical intervention.  The patient's history has been reviewed, patient examined, no change in status, stable for surgery.  I have reviewed the patient's chart and labs.  Questions were answered to the patient's satisfaction.     Louann Sjogren

## 2022-09-04 NOTE — Hospital Course (Signed)
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  She underwent a left foot TMA and went home with a wound VAC in 1 week was found to the patient developed an infection at the surgical site and she was started on doxycycline twice a day.  She continued to have constant bloody pus coming out of the left surgical wound and has severe neuropathy and does not feel a significant pain associate with a left foot wound.  She had to go see her podiatrist on a routine visit yesterday who evaluated and sent her to the ED for IV antibiotics and possible further surgical intervention given that her left foot continue to bleed and because the wound appeared to dehisce.  She was initiated on IV vancomycin, cefepime and Flagyl and podiatry is consulted and took the patient for a left foot transmetatarsal amputation revision with irrigation and debridement and infectious diseases consulted for further evaluation.  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 There is bandage material overlying the distal soft tissues with postsurgical skin staples. The distal surgical wound appears to extend to the remaining distal bone. The surgical bone resection margins appear fairly sharp, and no definitive radiographic evidence of  osteomyelitis is seen. Recommend clinical correlation for evidence of infection contacting bone." -Discussed with on-call podiatrist PA, who ordered MRI -MRI done and showed "Postsurgical changes from recent transmetatarsal amputation. Nonspecific marrow edema in each of the remaining metatarsals adjacent to the amputation margins without gross cortical destruction. This could be reactive or secondary to early osteomyelitis. Nonspecific ill-defined fluid adjacent to the metatarsal stumps, greatest medially. No well-defined fluid collections are identified. Nonspecific diffuse muscular edema which may relate to diabetes." -WBC Trend, Lactic Acid and Inflammatory Marker Trend: Recent Labs  Lab 08/16/22 0145 08/17/22 0153 08/17/22 0737 08/18/22 0052 08/19/22 0637 09/03/22 1216 09/03/22 1237 09/03/22 1400 09/03/22 1645 09/04/22 0125  WBC 10.2 11.9* 11.9* 11.6* 13.8*  --  9.5  --   --  9.3  LATICACIDVEN  --   --   --   --   --    < >  --  1.5  --   --   CRP  --   --   --   --   --   --   --   --  0.9  --    < > = values in this interval not displayed.  -ESR was 70 -Likelihood of losing left foot is high, briefly discussed with patient family. -Podiatry consulted the patient for a left revision of the transmetatarsal amputation with irrigation and debridement -ID consulted for further evaluation and recommendations   PVD with HX of Right BKA -Underwent extensive treatment by vascular surgery including angiogram, intra-arterial lithotripsy and stenting recently -Continue Aspirin 81 mg po Daily; Plavix and not started for some reason and do not appear to be on her MAR now -Continue Zetia  -  Will need to confirm Home Regimen with Pharmacy but appears to have a Statin Allergy so was not initiated in May due to allergy   IDDM with Insulin Resistance and Neuropathy -Most recent A1c 6.3 -C/w Pregablain 50 mg po Daily -Given her history of ESRD, will cut down long-acting insulin dosage and is now  on Semglee 15 units sq Daily -C/w SSI Sensitive Novolog AC/HS -CBG Trend: Recent Labs  Lab 08/20/22 0607 08/20/22 1149 09/03/22 1753 09/03/22 2038 09/04/22 0739 09/04/22 1254 09/04/22 1319  GLUCAP 126* 237* 201* 287* 156* 88 88    HTN -Continue Metoprolol Succinate 50 mg po Daily -Continue to Monitor BP per Protocol -Last BP reading was 154/60  HLD -C/w Ezetimibe 10 mg po Daily  ESRD on HD MWF -BUN/Cr Trend: Recent Labs  Lab 08/14/22 0857 08/15/22 0612 08/17/22 0737 08/19/22 0637 09/03/22 1237 09/04/22 0127 09/04/22 1345  BUN 31* 13 43* 38* 36* 53* 26*  CREATININE 6.27* 3.62* 8.02* 7.11* 5.13* 5.95* 3.40*  -Nephrology consulted for further evaluation and patient is being dialyzed today -C/w Cinacalcet 90 mg po TIDwm and Furosemide 80 mg po BID as well as Sucroferric Oxyhydroxide 1000 mg po TIDwm and 500 mg po Custom -Avoid Nephrotoxic Medications, Contrast Dyes, Hypotension and Dehydration to Ensure Adequate Renal Perfusion and will need to Renally Adjust Meds -Continue to Monitor and Trend Renal Function carefully and repeat CMP in the AM   Normocytic Anemia/Anemia of Chronic Disease -Hgb/Hct Trend: Recent Labs  Lab 08/17/22 0153 08/17/22 0737 08/18/22 0052 08/19/22 0637 09/03/22 1237 09/04/22 0125 09/04/22 1345  HGB 8.0* 7.9* 8.2* 7.9* 8.9* 8.1* 9.9*  HCT 27.3* 26.9* 27.4* 26.0* 29.8* 26.5* 29.0*  MCV 83.5 81.8 80.6 80.7 82.5 81.3  --   -Check Anemia Panel in the AM -Continue to Monitor for S/Sx of Bleeding; no overt bleeding noted -Repeat CBC in the AM  Depression and Anxiety -C/w Sertraline 50 mg po Daily  Hypoalbuminemia -Patient's Albumin Trend: Recent Labs  Lab 08/13/22 1452 08/14/22 0857 08/15/22 0612 08/17/22 0737 08/19/22 0637 09/03/22 1237 09/04/22 0125  ALBUMIN 3.1* 3.0* 2.7* 2.9* 2.6* 3.4* 2.9*  -Continue to Monitor and Trend and repeat CMP in the AM  Obesity -Complicates overall prognosis and care -Estimated body mass index is  30.05 kg/m as calculated from the following:   Height as of this encounter: 5\' 4"  (1.626 m).   Weight as of this encounter: 79.4 kg.  -Weight Loss and Dietary Counseling given

## 2022-09-04 NOTE — Anesthesia Preprocedure Evaluation (Addendum)
Anesthesia Evaluation  Patient identified by MRN, date of birth, ID band Patient awake    Reviewed: Allergy & Precautions, NPO status , Patient's Chart, lab work & pertinent test results, reviewed documented beta blocker date and time   History of Anesthesia Complications Negative for: history of anesthetic complications  Airway Mallampati: II  TM Distance: >3 FB Neck ROM: Full    Dental  (+) Dental Advisory Given   Pulmonary sleep apnea (does not use CPAP regularly)    breath sounds clear to auscultation       Cardiovascular hypertension, Pt. on medications and Pt. on home beta blockers (-) angina + Peripheral Vascular Disease  (-) CAD  Rhythm:Regular Rate:Normal  05/2022 ECHO: EF 60-65%, normal LVF, mod LVH, Grade 2 DD, normal RVF, trivial MR, trivial AI, mild AS with mean grad 13 mmHg   Neuro/Psych  Headaches   Depression    TIA   GI/Hepatic negative GI ROS, Neg liver ROS,,,  Endo/Other  diabetes (glu 95), Insulin Dependent    Renal/GU ESRF and DialysisRenal disease (MWF dialysis, K+ 3.5)     Musculoskeletal   Abdominal   Peds  Hematology  (+) Blood dyscrasia (Hb 9.9. plt 350k), anemia   Anesthesia Other Findings   Reproductive/Obstetrics                              Anesthesia Physical Anesthesia Plan  ASA: 3  Anesthesia Plan: MAC   Post-op Pain Management: Tylenol PO (pre-op)*   Induction:   PONV Risk Score and Plan: 2 and Ondansetron and Treatment may vary due to age or medical condition  Airway Management Planned: Natural Airway and Simple Face Mask  Additional Equipment: None  Intra-op Plan:   Post-operative Plan:   Informed Consent: I have reviewed the patients History and Physical, chart, labs and discussed the procedure including the risks, benefits and alternatives for the proposed anesthesia with the patient or authorized representative who has indicated his/her  understanding and acceptance.     Dental advisory given  Plan Discussed with: CRNA and Surgeon  Anesthesia Plan Comments:         Anesthesia Quick Evaluation

## 2022-09-05 ENCOUNTER — Inpatient Hospital Stay (HOSPITAL_COMMUNITY): Payer: Medicare PPO

## 2022-09-05 ENCOUNTER — Encounter (HOSPITAL_COMMUNITY): Payer: Self-pay | Admitting: Podiatry

## 2022-09-05 DIAGNOSIS — N184 Chronic kidney disease, stage 4 (severe): Secondary | ICD-10-CM

## 2022-09-05 DIAGNOSIS — M86672 Other chronic osteomyelitis, left ankle and foot: Secondary | ICD-10-CM | POA: Diagnosis not present

## 2022-09-05 DIAGNOSIS — L089 Local infection of the skin and subcutaneous tissue, unspecified: Secondary | ICD-10-CM | POA: Diagnosis not present

## 2022-09-05 DIAGNOSIS — N186 End stage renal disease: Secondary | ICD-10-CM | POA: Diagnosis not present

## 2022-09-05 DIAGNOSIS — Z89432 Acquired absence of left foot: Secondary | ICD-10-CM | POA: Diagnosis not present

## 2022-09-05 LAB — CBC WITH DIFFERENTIAL/PLATELET
Abs Immature Granulocytes: 0.1 10*3/uL — ABNORMAL HIGH (ref 0.00–0.07)
Basophils Absolute: 0.1 10*3/uL (ref 0.0–0.1)
Basophils Relative: 0 %
Eosinophils Absolute: 0.1 10*3/uL (ref 0.0–0.5)
Eosinophils Relative: 0 %
HCT: 26.8 % — ABNORMAL LOW (ref 36.0–46.0)
Hemoglobin: 8.1 g/dL — ABNORMAL LOW (ref 12.0–15.0)
Immature Granulocytes: 1 %
Lymphocytes Relative: 9 %
Lymphs Abs: 1.5 10*3/uL (ref 0.7–4.0)
MCH: 24.3 pg — ABNORMAL LOW (ref 26.0–34.0)
MCHC: 30.2 g/dL (ref 30.0–36.0)
MCV: 80.5 fL (ref 80.0–100.0)
Monocytes Absolute: 0.8 10*3/uL (ref 0.1–1.0)
Monocytes Relative: 5 %
Neutro Abs: 14.5 10*3/uL — ABNORMAL HIGH (ref 1.7–7.7)
Neutrophils Relative %: 85 %
Platelets: 347 10*3/uL (ref 150–400)
RBC: 3.33 MIL/uL — ABNORMAL LOW (ref 3.87–5.11)
RDW: 18.6 % — ABNORMAL HIGH (ref 11.5–15.5)
WBC: 16.9 10*3/uL — ABNORMAL HIGH (ref 4.0–10.5)
nRBC: 0.1 % (ref 0.0–0.2)

## 2022-09-05 LAB — COMPREHENSIVE METABOLIC PANEL
ALT: 23 U/L (ref 0–44)
AST: 16 U/L (ref 15–41)
Albumin: 2.9 g/dL — ABNORMAL LOW (ref 3.5–5.0)
Alkaline Phosphatase: 96 U/L (ref 38–126)
Anion gap: 14 (ref 5–15)
BUN: 42 mg/dL — ABNORMAL HIGH (ref 8–23)
CO2: 26 mmol/L (ref 22–32)
Calcium: 8.8 mg/dL — ABNORMAL LOW (ref 8.9–10.3)
Chloride: 98 mmol/L (ref 98–111)
Creatinine, Ser: 4.87 mg/dL — ABNORMAL HIGH (ref 0.44–1.00)
GFR, Estimated: 9 mL/min — ABNORMAL LOW (ref >60)
Glucose, Bld: 240 mg/dL — ABNORMAL HIGH (ref 70–99)
Potassium: 3.6 mmol/L (ref 3.5–5.1)
Sodium: 138 mmol/L (ref 135–145)
Total Bilirubin: 0.7 mg/dL (ref 0.3–1.2)
Total Protein: 6.8 g/dL (ref 6.5–8.1)

## 2022-09-05 LAB — IRON AND TIBC
Iron: 19 ug/dL — ABNORMAL LOW (ref 28–170)
Saturation Ratios: 11 % (ref 10.4–31.8)
TIBC: 181 ug/dL — ABNORMAL LOW (ref 250–450)
UIBC: 162 ug/dL

## 2022-09-05 LAB — PHOSPHORUS: Phosphorus: 3.7 mg/dL (ref 2.5–4.6)

## 2022-09-05 LAB — GLUCOSE, CAPILLARY
Glucose-Capillary: 249 mg/dL — ABNORMAL HIGH (ref 70–99)
Glucose-Capillary: 264 mg/dL — ABNORMAL HIGH (ref 70–99)
Glucose-Capillary: 171 mg/dL — ABNORMAL HIGH (ref 70–99)
Glucose-Capillary: 296 mg/dL — ABNORMAL HIGH (ref 70–99)

## 2022-09-05 LAB — CULTURE, BLOOD (ROUTINE X 2): Culture: NO GROWTH

## 2022-09-05 LAB — VITAMIN B12: Vitamin B-12: 572 pg/mL (ref 180–914)

## 2022-09-05 LAB — AEROBIC/ANAEROBIC CULTURE W GRAM STAIN (SURGICAL/DEEP WOUND)

## 2022-09-05 LAB — MAGNESIUM: Magnesium: 1.8 mg/dL (ref 1.7–2.4)

## 2022-09-05 LAB — FOLATE: Folate: 9.5 ng/mL (ref >5.9)

## 2022-09-05 LAB — RETICULOCYTES
Immature Retic Fract: 37.7 % — ABNORMAL HIGH (ref 2.3–15.9)
RBC.: 3.37 MIL/uL — ABNORMAL LOW (ref 3.87–5.11)
Retic Count, Absolute: 100.8 10*3/uL (ref 19.0–186.0)
Retic Ct Pct: 3 % (ref 0.4–3.1)

## 2022-09-05 LAB — FERRITIN: Ferritin: 885 ng/mL — ABNORMAL HIGH (ref 11–307)

## 2022-09-05 NOTE — Progress Notes (Signed)
[facility] Progress Note   Subjective:  Seen in room. S/p HD and then L foot revision yesterday. C/o foot pain, but no CP or dyspnea.   Objective Vitals:   09/04/22 1708 09/04/22 2052 09/05/22 0338 09/05/22 0816  BP: (!) 140/46 (!) 124/52 (!) 134/49 (!) 134/41  Pulse: 80 86 66 78  Resp: 16 15 16 18   Temp: 98.8 F (37.1 C) 98.8 F (37.1 C) 98 F (36.7 C) 98.7 F (37.1 C)  TempSrc: Oral Oral  Oral  SpO2: 99% 96% 96% 100%  Weight:      Height:       Physical Exam General: Well appearing woman, NAD. Room air Heart: RRR; 3/6 murmur Lungs: CTA anteriorly Abdomen: soft Extremities: no LE edema, L foot bandaged with ACE wrap Dialysis Access:  LUE AVF + bruit  Additional Objective Labs: Basic Metabolic Panel: Recent Labs  Lab 09/03/22 1237 09/04/22 0125 09/04/22 0127 09/04/22 1345 09/05/22 0243  NA 140  --  140 139 138  K 3.7  --  3.5 3.5 3.6  CL 101  --  102 97* 98  CO2 24  --  24  --  26  GLUCOSE 164*  --  246* 95 240*  BUN 36*  --  53* 26* 42*  CREATININE 5.13*  --  5.95* 3.40* 4.87*  CALCIUM 8.6*  --  8.6*  --  8.8*  PHOS  --  5.1*  --   --  3.7   Liver Function Tests: Recent Labs  Lab 09/03/22 1237 09/04/22 0125 09/05/22 0243  AST 17 16 16   ALT 24 22 23   ALKPHOS 96 96 96  BILITOT 0.4 0.7 0.7  PROT 7.6 6.7 6.8  ALBUMIN 3.4* 2.9* 2.9*   CBC: Recent Labs  Lab 09/03/22 1237 09/04/22 0125 09/04/22 1345 09/05/22 0243  WBC 9.5 9.3  --  16.9*  NEUTROABS 6.8 6.5  --  14.5*  HGB 8.9* 8.1* 9.9* 8.1*  HCT 29.8* 26.5* 29.0* 26.8*  MCV 82.5 81.3  --  80.5  PLT 354 350  --  347   CBG: Recent Labs  Lab 09/04/22 1319 09/04/22 1512 09/04/22 1708 09/04/22 2057 09/05/22 0818  GLUCAP 88 93 126* 317* 249*   Iron Studies:  Recent Labs    09/05/22 0243  IRON 19*  TIBC 181*  FERRITIN 885*   Studies/Results: MR FOOT LEFT WO CONTRAST  Result Date: 09/03/2022 CLINICAL DATA:  Left transmetatarsal amputation 08/15/2022. Wound infection.  History of end-stage renal disease and diabetes. EXAM: MRI OF THE LEFT FOOT WITHOUT CONTRAST TECHNIQUE: Multiplanar, multisequence MR imaging of the left foot was performed. No intravenous contrast was administered. COMPARISON:  Radiographs 09/03/2022 and 08/13/2022. No previous MRI. FINDINGS: Bones/Joint/Cartilage Postsurgical changes from recent transmetatarsal amputation. Skin staples remain in place, causing some susceptibility artifact. There is nonspecific marrow edema in each of the remaining metatarsals adjacent to the amputation margins. No gross cortical destruction. Mild degenerative changes at the Lisfranc joint without subluxation. No suspicious tarsal bone findings. No significant joint effusions. Ligaments Intact Lisfranc ligament. Muscles and Tendons Nonspecific diffuse muscular edema which may relate to diabetes. No intramuscular fluid collections are demonstrated. Soft tissues Nonspecific ill-defined fluid adjacent to the metatarsal stumps, greatest medially. No well-defined fluid collections are identified. There is mild nonspecific generalized subcutaneous edema. IMPRESSION: 1. Postsurgical changes from recent transmetatarsal amputation. 2. Nonspecific marrow edema in each of the remaining metatarsals adjacent to the amputation margins without gross cortical destruction. This could be reactive or secondary to  early osteomyelitis. 3. Nonspecific ill-defined fluid adjacent to the metatarsal stumps, greatest medially. No well-defined fluid collections are identified. 4. Nonspecific diffuse muscular edema which may relate to diabetes. Electronically Signed   By: Carey Bullocks M.D.   On: 09/03/2022 16:02   DG Foot 2 Views Left  Result Date: 09/03/2022 CLINICAL DATA:  Infection.  Open wound. EXAM: LEFT FOOT - 2 VIEW COMPARISON:  Left foot radiographs 08/13/2022 FINDINGS: Interval amputation of the first through fifth digits to the proximal metatarsal shafts. There is bandage material overlying  the distal soft tissues with postsurgical skin staples. The surgical bone resection margins appear fairly sharp. The acuity of the surgery and lack of expected sclerosis limit evaluation for early erosions. On lateral view the distal surgical wound appears to extend to the remaining distal bone. Moderate plantar and posterior calcaneal heel spurs. High-grade atherosclerotic calcifications. Additional distal calf subcutaneous fat lobule calcifications from likely chronic venous stasis, similar to prior. IMPRESSION: 1. Interval amputation of the first through fifth digits to the proximal metatarsal shafts. 2. There is bandage material overlying the distal soft tissues with postsurgical skin staples. The distal surgical wound appears to extend to the remaining distal bone. The surgical bone resection margins appear fairly sharp, and no definitive radiographic evidence of osteomyelitis is seen. Recommend clinical correlation for evidence of infection contacting bone. Electronically Signed   By: Neita Garnet M.D.   On: 09/03/2022 13:53    Medications:  ceFEPime (MAXIPIME) IV 1 g (09/04/22 2205)   vancomycin      (feeding supplement) PROSource Plus  30 mL Oral BID BM   aspirin  81 mg Oral Daily   Chlorhexidine Gluconate Cloth  6 each Topical Q0600   cinacalcet  90 mg Oral Once per day on Mon Wed Fri   DULoxetine  60 mg Oral Daily   ezetimibe  10 mg Oral Daily   furosemide  80 mg Oral BID   heparin  5,000 Units Subcutaneous Q12H   insulin aspart  0-5 Units Subcutaneous QHS   insulin aspart  0-9 Units Subcutaneous TID WC   insulin glargine-yfgn  15 Units Subcutaneous Daily   ketotifen  1 drop Both Eyes BID   metoprolol succinate  50 mg Oral Daily   metroNIDAZOLE  500 mg Oral Q12H   pregabalin  50 mg Oral Daily   sertraline  50 mg Oral Daily   sucroferric oxyhydroxide  1,000 mg Oral TID WC   And   sucroferric oxyhydroxide  500 mg Oral Daily    Dialysis Orders: MWF at [facility] 3:45hr,  400/500, EDW 80.5kg, 2K/2.5Ca, UFP#2, LUE AVF, heparin 1000 unit initial + 1000 unit/hr - Sensipar 90mg  q HD, no VDRA - Mircera IV q 2 weeks - last 09/02/22   Assessment/Plan: 1. L foot TMA dehiscence/infection: On Vanc/Cefepime, s/p debridement/revision 6/14. Intra op wound/bone Cx sent/pending. Per podiatry. 2. ESRD: Continue MWF schedule - next HD 6/17. 3. HTN/volume: BP controlled. Continue home meds, she is slightly below prior EDW. 4. Anemia of ESRD: Hgb 8.1 - given low dose ESA earlier this week, not due until 6/26. Transfuse prn 5. Secondary hyperparathyroidism: Ca/Phos ok - continue  [name] as binder, sensipar. 6. Nutrition:  Alb low, continue protein supp. 7. T2DM: Insulin per primary.  Ozzie Hoyle, PA-C 09/05/2022, 9:21 AM  [employer]

## 2022-09-05 NOTE — Progress Notes (Signed)
OT Screening Note  Patient Details Name: Kathryn Beck MRN: [id number] DOB: [birth year]   Cancelled Treatment:    Reason Eval/Treat Not Completed: OT screened, no needs identified, will sign off (Discussed with PT the patients current level of function, needs adequate assist for bADLs/iADLs at baseline and plans on returning back to SNF. Due to NWB and AKA pt is limited to scooting at this time until able to get a prosthesis. OT will sign off.)  09/05/2022  AB, OTR/L  Acute Rehabilitation Services  Office: [phone]   Kathryn Beck 09/05/2022, 4:47 PM

## 2022-09-05 NOTE — Evaluation (Signed)
Physical Therapy Evaluation Patient Details Name: Kathryn Beck MRN: [id number] DOB: [birth year] Today's Date: 09/05/2022  History of Present Illness  67 y.o.female admitted 6/13 for wound infection of recent TMA of the left foot. 6/14 s/p Left revision transmetatarsal amputation with irrigation and debridement . 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 PMH: ESRD on HD, HTN, DM2, CVA, recent R LE BKA  Clinical Impression  Patient is s/p above surgery resulting in functional limitations due to the deficits listed below (see PT Problem List). Seemed to be making solid progress at SNF prior to admission. Showing improved trunk and UE strength/control with bed mobility and transfer training today. Reviewed A/P transfers due to NWB on Lt, and BKA on Rt. This was performed safely. Likely ready for lateral transfers at min assist to min guard level (will continue to progress during admission.) Eager to return to rehab for further training and independence before returning home.  Patient will benefit from acute skilled PT to increase their independence and safety with mobility to facilitate discharge.        Recommendations for follow up therapy are one component of a multi-disciplinary discharge planning process, led by the attending physician.  Recommendations may be updated based on patient status, additional functional criteria and insurance authorization.  Follow Up Recommendations Can patient physically be transported by private vehicle: No     Assistance Recommended at Discharge Frequent or constant Supervision/Assistance  Patient can return home with the following  A little help with walking and/or transfers;A little help with bathing/dressing/bathroom;Assistance with cooking/housework;Direct supervision/assist for medications management;Assist for transportation;Help with stairs or ramp for entrance    Equipment Recommendations None recommended by PT   Recommendations for Other Services       Functional Status Assessment Patient has had a recent decline in their functional status and demonstrates the ability to make significant improvements in function in a reasonable and predictable amount of time.     Precautions / Restrictions Precautions Precautions: Fall Required Braces or Orthoses: Other Brace Other Brace: CAM Boot for L LE Restrictions Weight Bearing Restrictions: Yes LLE Weight Bearing: Non weight bearing      Mobility  Bed Mobility Overal bed mobility: Needs Assistance Bed Mobility: Supine to Sit     Supine to sit: Min guard     General bed mobility comments: Min guard for safety, slow to rise, cues for technique to long sit for set-up with A/P transfer training.    Transfers Overall transfer level: Needs assistance Equipment used: None Transfers: Bed to chair/wheelchair/BSC         Anterior-Posterior transfers: Min assist, +2 safety/equipment   General transfer comment: Practiced posterior transfer showing good trunk control. Min assist to scoot bed pad. Good UE strength to lift buttock for easier transition. Cues for technique throughout.    Ambulation/Gait                  Stairs            Wheelchair Mobility    Modified Rankin (Stroke Patients Only)       Balance Overall balance assessment: Needs assistance Sitting-balance support: No upper extremity supported, Feet unsupported Sitting balance-Leahy Scale: Good Sitting balance - Comments: Long sit in bed.                                     Pertinent Vitals/Pain Pain Assessment Pain  Assessment: Faces Faces Pain Scale: Hurts a little bit Pain Location: LLE Pain Descriptors / Indicators: Discomfort    Home Living Family/patient expects to be discharged to:: Skilled nursing facility Living Arrangements: Spouse/significant other;Other (Comment) (granddaughter) Available Help at Discharge: Family;Available  PRN/intermittently Type of Home: House Home Access: Stairs to enter Entrance Stairs-Rails: None Entrance Stairs-Number of Steps: 2   Home Layout: Able to live on main level with bedroom/bathroom Home Equipment: BSC/3in1;Wheelchair - Forensic psychologist (2 wheels);Cane - single point;Grab bars - tub/shower Additional Comments: Was in SNF for rehab following recent operation - plans to return to continue therapies.    Prior Function Prior Level of Function : Needs assist             Mobility Comments: Pt was working transfers with assist at SNF ADLs Comments: Needed assist with ADLs at SNF     Hand Dominance   Dominant Hand: Right    Extremity/Trunk Assessment   Upper Extremity Assessment Upper Extremity Assessment: Defer to OT evaluation    Lower Extremity Assessment Lower Extremity Assessment: RLE deficits/detail;LLE deficits/detail RLE Deficits / Details: R BKA about 1 month ago per pt, getting fitted for prosthesis. Wears shrinker LLE Deficits / Details: Lt trans met, bandaged.    Cervical / Trunk Assessment Cervical / Trunk Assessment: Normal  Communication   Communication: HOH  Cognition Arousal/Alertness: Awake/alert Behavior During Therapy: WFL for tasks assessed/performed Overall Cognitive Status: Within Functional Limits for tasks assessed                                          General Comments General comments (skin integrity, edema, etc.): Dressing clean, dry, and intact.    Exercises     Assessment/Plan    PT Assessment Patient needs continued PT services  PT Problem List Decreased strength;Decreased range of motion;Decreased activity tolerance;Decreased balance;Decreased mobility;Decreased coordination;Decreased cognition;Decreased knowledge of use of DME;Decreased safety awareness;Cardiopulmonary status limiting activity;Impaired sensation;Decreased skin integrity;Pain;Decreased knowledge of precautions;Obesity       PT  Treatment Interventions DME instruction;Gait training;Functional mobility training;Stair training;Therapeutic activities;Therapeutic exercise;Balance training;Cognitive remediation;Patient/family education;Wheelchair mobility training;Neuromuscular re-education;Modalities    PT Goals (Current goals can be found in the Care Plan section)  Acute Rehab PT Goals Patient Stated Goal: Return to SNF for rehab PT Goal Formulation: With patient Time For Goal Achievement: 09/12/22 Potential to Achieve Goals: Good    Frequency Min 2X/week     Co-evaluation               AM-PAC PT "6 Clicks" Mobility  Outcome Measure Help needed turning from your back to your side while in a flat bed without using bedrails?: A Little Help needed moving from lying on your back to sitting on the side of a flat bed without using bedrails?: A Little Help needed moving to and from a bed to a chair (including a wheelchair)?: A Little Help needed standing up from a chair using your arms (e.g., wheelchair or bedside chair)?: Total Help needed to walk in hospital room?: Total Help needed climbing 3-5 steps with a railing? : Total 6 Click Score: 12    End of Session Equipment Utilized During Treatment: Gait belt Activity Tolerance: Patient tolerated treatment well Patient left: in chair;with call bell/phone within reach;with chair alarm set Nurse Communication: Mobility status;Other (comment) (a/p or lateral scoot at min a level appropriate (Nurse tech)) PT Visit Diagnosis: Muscle weakness (generalized) (  M62.81);Difficulty in walking, not elsewhere classified (R26.2);Pain Pain - Right/Left: Left Pain - part of body: Ankle and joints of foot    Time: 1510-1526 PT Time Calculation (min) (ACUTE ONLY): 16 min   Charges:   PT Evaluation $PT Eval Low Complexity: 1 Low          Kathlyn [place], PT, DPT [facility]  Rehabilitation Services Physical Therapist Office: [phone] Website:  [email or web address]   Berton Mount 09/05/2022, 4:53 PM

## 2022-09-05 NOTE — Progress Notes (Signed)
Subjective:  Patient ID: Kathryn Beck, female    DOB: Mar 28, 1955,  MRN: [id number]  Patient POD#1 s/p left transmetatarsal amputation revision and irrigation and debridement. Relates some pain. Denis n/v/f/c.   Past Medical History:  Diagnosis Date   Acid reflux    Anemia    Arthritis    Axillary masses    Soft tissue - status post excision   Back pain    COVID-19 virus infection 04/06/2019   Depression    End-stage renal disease (HCC)    M/W/F dialysis   Essential hypertension    Headache    years ago   History of blood transfusion    History of cardiac catheterization    Normal coronary arteries October 2020   History of claustrophobia    History of pneumonia 2019   Hypoxia 04/03/2019   Memory loss    Mixed hyperlipidemia    Obesity    Pancreatitis    Peritoneal dialysis catheter in place [facility])    Pneumonia due to COVID-19 virus 04/02/2019   Sleep apnea    Noncompliant with CPAP   Stroke (HCC)    mini stroke   Type 2 diabetes mellitus (HCC)      Past Surgical History:  Procedure Laterality Date   ABDOMINAL AORTOGRAM W/LOWER EXTREMITY N/A 04/30/2022   Procedure: ABDOMINAL AORTOGRAM W/LOWER EXTREMITY;  Surgeon: Leonie Douglas, MD;  Location: [facility];  Service: Cardiovascular;  Laterality: N/A;   ABDOMINAL AORTOGRAM W/LOWER EXTREMITY N/A 07/21/2022   Procedure: ABDOMINAL AORTOGRAM W/LOWER EXTREMITY;  Surgeon: Nada Libman, MD;  Location: [facility];  Service: Cardiovascular;  Laterality: N/A;   ABDOMINAL HYSTERECTOMY     ACHILLES TENDON LENGTHENING  08/15/2022   Procedure: ACHILLES TENDON LENGTHENING;  Surgeon: [name], DPM;  Location: [facility] OR;  Service: Podiatry;;   AMPUTATION Right 05/29/2022   Procedure: RIGHT BELOW THE KNEE AMPUTATION;  Surgeon: [name], MD;  Location: [facility] OR;  Service: Orthopedics;  Laterality: Right;   AMPUTATION Left 09/04/2022   Procedure: AMPUTATION FOOT, serial irrigation;  Surgeon: Louann Sjogren, DPM;   Location: [facility] OR;  Service: Podiatry;  Laterality: Left;  Surgical team to do block   AV FISTULA PLACEMENT Left 09/02/2017   Procedure: creation of left arm ARTERIOVENOUS (AV) FISTULA;  Surgeon: Nada Libman, MD;  Location: [facility] OR;  Service: Vascular;  Laterality: Left;   COLONOSCOPY  2008   Dr. Darrick Penna: normal    COLONOSCOPY N/A 12/18/2016   Dr. Darrick Penna: multiple tubular adenomas, internal hemorrhoids. Surveillance in 3 years    ESOPHAGEAL DILATION N/A 10/13/2015   Procedure: ESOPHAGEAL DILATION;  Surgeon: Malissa Hippo, MD;  Location: [facility];  Service: Endoscopy;  Laterality: N/A;   ESOPHAGOGASTRODUODENOSCOPY N/A 10/13/2015   Dr. Karilyn Cota: chronic gastritis on path, no H.pylori. Empiric dilation    ESOPHAGOGASTRODUODENOSCOPY N/A 12/18/2016   Dr. Darrick Penna: mild gastritis. BRAVO study revealed uncontrolled GERD. Dysphagia secondary to uncontrolled reflux   FOOT SURGERY Bilateral    "nerve"     LEFT HEART CATH AND CORONARY ANGIOGRAPHY N/A 12/29/2018   Procedure: LEFT HEART CATH AND CORONARY ANGIOGRAPHY;  Surgeon: Corky Crafts, MD;  Location: [facility] INVASIVE CV LAB;  Service: Cardiovascular;  Laterality: N/A;   LOWER EXTREMITY ANGIOGRAPHY Right 05/04/2022   Procedure: Lower Extremity Angiography;  Surgeon: Victorino Sparrow, MD;  Location: [facility] INVASIVE CV LAB;  Service: Cardiovascular;  Laterality: Right;   LUNG BIOPSY     MASS EXCISION Right 01/09/2013  Procedure: EXCISION OF NEOPLASM OF RIGHT  AXILLA  AND EXCISION OF NEOPLASM OF LEFT AXILLA;  Surgeon: Dalia Heading, MD;  Location: [facility] ORS;  Service: General;  Laterality: Right;  procedure end @ 08:23   MYRINGOTOMY WITH TUBE PLACEMENT Bilateral 04/28/2017   Procedure: BILATERAL MYRINGOTOMY WITH TUBE PLACEMENT;  Surgeon: Newman Pies, MD;  Location: [facility] OR;  Service: ENT;  Laterality: Bilateral;   PERIPHERAL VASCULAR BALLOON ANGIOPLASTY Right 05/04/2022   Procedure: PERIPHERAL VASCULAR BALLOON ANGIOPLASTY;  Surgeon: Victorino Sparrow, MD;  Location:  [facility] INVASIVE CV LAB;  Service: Cardiovascular;  Laterality: Right;  PT   PERIPHERAL VASCULAR INTERVENTION Right 05/04/2022   Procedure: PERIPHERAL VASCULAR INTERVENTION;  Surgeon: Victorino Sparrow, MD;  Location: [facility] INVASIVE CV LAB;  Service: Cardiovascular;  Laterality: Right;  SFA   PERIPHERAL VASCULAR INTERVENTION Left 07/21/2022   Procedure: PERIPHERAL VASCULAR INTERVENTION;  Surgeon: Nada Libman, MD;  Location: [facility];  Service: Cardiovascular;  Laterality: Left;   REVISION OF ARTERIOVENOUS GORETEX GRAFT Left 05/04/2018   Procedure: TRANSPOSITION OF CEPHALIC VEIN ARTERIOVENOUS FISTULA LEFT ARM;  Surgeon: Larina Earthly, MD;  Location: [facility] OR;  Service: Vascular;  Laterality: Left;   SAVORY DILATION N/A 12/18/2016   Procedure: SAVORY DILATION;  Surgeon: West Bali, MD;  Location: [facility];  Service: Endoscopy;  Laterality: N/A;   TRANSMETATARSAL AMPUTATION Left 08/15/2022   Procedure: TRANSMETATARSAL AMPUTATION;  Surgeon: [name], DPM;  Location: [facility] OR;  Service: Podiatry;  Laterality: Left;       Latest Ref Rng & Units 09/05/2022    2:43 AM 09/04/2022    1:45 PM 09/04/2022    1:25 AM  CBC  WBC 4.0 - 10.5 K/uL 16.9   9.3   Hemoglobin 12.0 - 15.0 g/dL 8.1  9.9  8.1   Hematocrit 36.0 - 46.0 % 26.8  29.0  26.5   Platelets 150 - 400 K/uL 347   350        Latest Ref Rng & Units 09/05/2022    2:43 AM 09/04/2022    1:45 PM 09/04/2022    1:27 AM  BMP  Glucose 70 - 99 mg/dL 161  95  096   BUN 8 - 23 mg/dL 42  26  53   Creatinine 0.44 - 1.00 mg/dL 0.45  4.09  8.11   Sodium 135 - 145 mmol/L 138  139  140   Potassium 3.5 - 5.1 mmol/L 3.6  3.5  3.5   Chloride 98 - 111 mmol/L 98  97  102   CO2 22 - 32 mmol/L 26   24   Calcium 8.9 - 10.3 mg/dL 8.8   8.6      Objective:   Vitals:   09/05/22 0338 09/05/22 0816  BP: (!) 134/49 (!) 134/41  Pulse: 66 78  Resp: 16 18  Temp: 98 F (36.7 C) 98.7 F (37.1 C)  SpO2: 96% 100%    General:AA&O x 3. Normal mood and  affect   Vascular: DP and PT pulses 2/4 bilateral. Brisk capillary refill to all digits. Pedal hair present   Neruological. Epicritic sensation grossly intact.   Derm: Dressing clean dry and intact  MSK: MMT 5/5 in dorsiflexion, plantar flexion, inversion and eversion. Normal joint ROM without pain or crepitus.       Assessment & Plan:  Patient was evaluated and treated and all questions answered.  DX: POD#1 s/p left transmetatarsal amputation revision  Dressing to remain clean dry and intact.  Antibiotics: Appreciate ID reccs. Will follow cultures.  DME: CAM boot and NWB to lower extremity.   Discussed with patient diagnosis and treatment options.  Pending cultures patient will be ok for discharge and return to SNF. Advise to avoid any compression dressing on this foot while at SNF as this is likley what caused incision to open.  Podiatry will continue to follow.   Louann Sjogren, DPM  Accessible via secure chat for questions or concerns.

## 2022-09-05 NOTE — Plan of Care (Signed)

## 2022-09-05 NOTE — Progress Notes (Signed)
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  She underwent a left foot TMA and went home with a wound VAC in 1 week was found to the patient developed an infection at the surgical site and she was started on doxycycline twice a day.  She continued to have constant bloody pus coming out of the left surgical wound and has severe neuropathy and does not feel a significant pain associate with a left foot wound.  She had to go see her podiatrist on a routine visit yesterday who evaluated and sent her to the ED for IV antibiotics and possible further surgical intervention given that her left foot continue to bleed and because the wound appeared to dehisce.    She was initiated on IV vancomycin, cefepime and Flagyl and podiatry was consulted and took the patient for a left foot transmetatarsal amputation revision with irrigation and debridement and infectious diseases consulted for further evaluation. She is POD 1 and complaining of Pain. Next HD is 09/07/22. Currently awaiting Cx's.   Assessment and Plan:  Left foot recurrent Osteomyelitis and deep tissue infection, failed outpatient management s/p Left Transmetatarsal Amputation Revision and I and D -C/w with IV vancomycin cefepime and Flagyl -DG Foot done and showed "Interval amputation of the first through fifth digits to the proximal  metatarsal shafts. There is bandage material overlying the distal soft tissues with postsurgical skin staples. The distal surgical wound appears to extend to the remaining distal bone. The surgical bone resection margins appear fairly sharp, and no definitive radiographic evidence of osteomyelitis is seen. Recommend clinical correlation for evidence of infection contacting bone." -Discussed with on-call podiatrist PA, who ordered MRI -MRI done and showed "Postsurgical changes from recent transmetatarsal amputation. Nonspecific marrow edema in each of the remaining metatarsals adjacent to the amputation margins without gross cortical destruction. This could be reactive or secondary to early osteomyelitis. Nonspecific ill-defined fluid adjacent to the metatarsal stumps, greatest medially. No well-defined fluid collections are identified. Nonspecific diffuse muscular edema which may relate to diabetes." -WBC Trend, Lactic Acid and Inflammatory Marker Trend: Recent Labs  Lab 08/17/22 0153 08/17/22 0737 08/18/22 0052 08/19/22 0637 09/03/22 1216 09/03/22 1237 09/03/22 1400 09/03/22 1645 09/04/22 0125 09/05/22 0243  WBC 11.9* 11.9* 11.6* 13.8*  --  9.5  --   --  9.3 16.9*  LATICACIDVEN  --   --   --   --    < >  --  1.5  --   --   --   CRP  --   --   --   --   --   --   --  0.9  --   --    < > = values in this interval not displayed.  -ESR was 70 -Likelihood of losing left foot is high, briefly discussed with patient family. -Podiatry consulted the patient for a left revision of the transmetatarsal amputation with irrigation and debridement and she is  POD 1 and Podiatry recommending CAM boot and NWB to Lower Extremity and following C/x and recommeding to avoid any compression dressing on the Left foot at SNF -ID consulted for further evaluation and recommendations and recommended continuing IV Vancomycin and IV Cefepime per HD and oral Metronidazole 500 mg po BID and following up on Cx results to  either decide continuing Abx with HD or Oral Abx and recommending continuing IV Abx if there are no Clear Margins  -Current Cx showing: Gram Stain RARE WBC PRESENT, PREDOMINANTLY PMN NO ORGANISMS SEEN Performed at [facility], [address] [place], [place] [address]  Culture PENDING     PVD with HX of Right BKA -Underwent extensive treatment by vascular surgery including angiogram, intra-arterial lithotripsy and stenting recently -Continue Aspirin 81 mg po Daily; Plavix and not started for some reason and do not appear to be on her MAR now -Continue Zetia  -Will need to confirm Home Regimen with Pharmacy but appears to have a Statin Allergy so was not initiated in May due to allergy   IDDM with Insulin Resistance and Neuropathy -Most recent A1c 6.3 -C/w Pregablain 50 mg po Daily -Given her history of ESRD, will cut down long-acting insulin dosage and is now on Semglee 15 units sq Daily -C/w SSI Sensitive Novolog AC/HS -CBG Trend: Recent Labs  Lab 09/04/22 0739 09/04/22 1254 09/04/22 1319 09/04/22 1512 09/04/22 1708 09/04/22 2057 09/05/22 0818  GLUCAP 156* 88 88 93 126* 317* 249*    HTN -Continue Metoprolol Succinate 50 mg po Daily -Continue to Monitor BP per Protocol -Last BP reading was 134/41  HLD -C/w Ezetimibe 10 mg po Daily  ESRD on HD MWF -BUN/Cr Trend: Recent Labs  Lab 08/15/22 0612 08/17/22 0737 08/19/22 0637 09/03/22 1237 09/04/22 0127 09/04/22 1345 09/05/22 0243  BUN 13 43* 38* 36* 53* 26* 42*  CREATININE 3.62* 8.02* 7.11* 5.13* 5.95* 3.40* 4.87*  -Nephrology consulted for further evaluation and patient was dialyzed on 09/04/22 with next HD Planned for 09/07/22 -C/w Cinacalcet 90 mg po TIDwm and Furosemide 80 mg po BID as well as Sucroferric Oxyhydroxide 1000 mg po TIDwm and 500 mg po Custom -Avoid Nephrotoxic Medications, Contrast Dyes, Hypotension and Dehydration to Ensure Adequate Renal Perfusion and will need to Renally Adjust  Meds -Continue to Monitor and Trend Renal Function carefully and repeat CMP in the AM   Normocytic Anemia/Anemia of Chronic Disease -Hgb/Hct Trend: Recent Labs  Lab 08/17/22 0737 08/18/22 0052 08/19/22 0637 09/03/22 1237 09/04/22 0125 09/04/22 1345 09/05/22 0243  HGB 7.9* 8.2* 7.9* 8.9* 8.1* 9.9* 8.1*  HCT 26.9* 27.4* 26.0* 29.8* 26.5* 29.0* 26.8*  MCV 81.8 80.6 80.7 82.5 81.3  --  80.5  -Checked Anemia Panel and it showed an Iron level of 19, UIBC of 162, TIBC of 181, Saturation Ratios of 11%, Ferritin 885, Folate Level of 9.5, and Vitamin B12 Level of 572 -Continue to Monitor for S/Sx of Bleeding; no overt bleeding noted -Repeat CBC in the AM  Depression and Anxiety -C/w Sertraline 50 mg po Daily  Hypoalbuminemia -Patient's Albumin Trend: Recent Labs  Lab 08/14/22 0857 08/15/22 0612 08/17/22 0737 08/19/22 0637 09/03/22 1237 09/04/22 0125 09/05/22 0243  ALBUMIN 3.0* 2.7* 2.9* 2.6* 3.4* 2.9* 2.9*  -Continue to Monitor and Trend and repeat CMP in the AM  Obesity -Complicates overall prognosis and care -Estimated body mass index is 30.05 kg/m as calculated from the following:   Height as of this encounter: 5\' 4"  (1.626 m).   Weight  as of this encounter: 79.4 kg.  -Weight Loss and Dietary Counseling given   DVT prophylaxis: heparin injection 5,000 Units Start: 09/03/22 1530    Code Status: Full Code Family Communication: No family present at bedside  Disposition Plan:  Level of care: Med-Surg Status is: Inpatient Remains inpatient appropriate because: Awaiting Cx's and will need to go back to SNF    Consultants:  Infectious Diseases Podiatry  Procedures:  1. Left revision transmetatarsal amputation with irrigation and debridement   Antimicrobials:  Anti-infectives (From admission, onward)    Start     Dose/Rate Route Frequency Ordered Stop   09/04/22 1200  vancomycin (VANCOREADY) IVPB 750 mg/150 mL        750 mg 150 mL/hr over 60 Minutes Intravenous  Every M-W-F (Hemodialysis) 09/03/22 1415     09/03/22 2200  metroNIDAZOLE (FLAGYL) tablet 500 mg  Status:  Discontinued        500 mg Oral Every 12 hours 09/03/22 1520 09/03/22 1520   09/03/22 2000  ceFEPIme (MAXIPIME) 1 g in sodium chloride 0.9 % 100 mL IVPB        1 g 200 mL/hr over 30 Minutes Intravenous Every 24 hours 09/03/22 1415     09/03/22 1315  vancomycin (VANCOREADY) IVPB 2000 mg/400 mL        2,000 mg 200 mL/hr over 120 Minutes Intravenous  Once 09/03/22 1304 09/03/22 2222   09/03/22 1315  ceFEPIme (MAXIPIME) 2 g in sodium chloride 0.9 % 100 mL IVPB        2 g 200 mL/hr over 30 Minutes Intravenous  Once 09/03/22 1304 09/03/22 1817   09/03/22 1300  metroNIDAZOLE (FLAGYL) tablet 500 mg        500 mg Oral Every 12 hours 09/03/22 1251 09/10/22 0959       Subjective: Seen and examined at bedside and she was complaining of foot pain and states she did not sleep well due to the pain. Currently it is a 9/10 in severity. No nausea or vomiting. States the procedure went well. No CP or SOB. No other concerns or complaints at this time.   Objective: Vitals:   09/04/22 1708 09/04/22 2052 09/05/22 0338 09/05/22 0816  BP: (!) 140/46 (!) 124/52 (!) 134/49 (!) 134/41  Pulse: 80 86 66 78  Resp: 16 15 16 18   Temp: 98.8 F (37.1 C) 98.8 F (37.1 C) 98 F (36.7 C) 98.7 F (37.1 C)  TempSrc: Oral Oral  Oral  SpO2: 99% 96% 96% 100%  Weight:      Height:        Intake/Output Summary (Last 24 hours) at 09/05/2022 1004 Last data filed at 09/05/2022 0500 Gross per 24 hour  Intake 1000 ml  Output 2505 ml  Net -1505 ml   Filed Weights   09/03/22 1235 09/04/22 0838 09/04/22 1155  Weight: 78.9 kg 81.9 kg 79.4 kg   Examination: Physical Exam:  Constitutional: WN/WD obese AAF in NAD appears calm but complaining of pain in Left foot Respiratory: Diminished to auscultation bilaterally, no wheezing, rales, rhonchi or crackles. Normal respiratory effort and patient is not tachypenic. No  accessory muscle use. Unlabored breathing  Cardiovascular: RRR, Has a murmur noted.  Abdomen: Soft, non-tender, non-distended. No masses palpated. No appreciable hepatosplenomegaly. Bowel sounds positive.  GU: Deferred. Musculoskeletal: No clubbing / cyanosis of digits/nails. Has a Right BKA and a Left foot TMA that is wrapped now and also has a LUE AVF Skin: No rashes, lesions, ulcers on a limited skin  evaluation Neurologic: CN 2-12 grossly intact with no focal deficits.  Romberg sign and cerebellar reflexes not assessed.  Psychiatric: Normal judgment and insight. Alert and oriented x 3. Normal mood and appropriate affect.   Data Reviewed: I have personally reviewed following labs and imaging studies  CBC: Recent Labs  Lab 09/03/22 1237 09/04/22 0125 09/04/22 1345 09/05/22 0243  WBC 9.5 9.3  --  16.9*  NEUTROABS 6.8 6.5  --  14.5*  HGB 8.9* 8.1* 9.9* 8.1*  HCT 29.8* 26.5* 29.0* 26.8*  MCV 82.5 81.3  --  80.5  PLT 354 350  --  347   Basic Metabolic Panel: Recent Labs  Lab 09/03/22 1237 09/04/22 0125 09/04/22 0127 09/04/22 1345 09/05/22 0243  NA 140  --  140 139 138  K 3.7  --  3.5 3.5 3.6  CL 101  --  102 97* 98  CO2 24  --  24  --  26  GLUCOSE 164*  --  246* 95 240*  BUN 36*  --  53* 26* 42*  CREATININE 5.13*  --  5.95* 3.40* 4.87*  CALCIUM 8.6*  --  8.6*  --  8.8*  MG  --  2.1  --   --  1.8  PHOS  --  5.1*  --   --  3.7   GFR: Estimated Creatinine Clearance: 11.6 mL/min (A) (by C-G formula based on SCr of 4.87 mg/dL (H)). Liver Function Tests: Recent Labs  Lab 09/03/22 1237 09/04/22 0125 09/05/22 0243  AST 17 16 16   ALT 24 22 23   ALKPHOS 96 96 96  BILITOT 0.4 0.7 0.7  PROT 7.6 6.7 6.8  ALBUMIN 3.4* 2.9* 2.9*   No results for input(s): "LIPASE", "AMYLASE" in the last 168 hours. No results for input(s): "AMMONIA" in the last 168 hours. Coagulation Profile: No results for input(s): "INR", "PROTIME" in the last 168 hours. Cardiac Enzymes: No results for  input(s): "CKTOTAL", "CKMB", "CKMBINDEX", "TROPONINI" in the last 168 hours. BNP (last 3 results) No results for input(s): "PROBNP" in the last 8760 hours. HbA1C: No results for input(s): "HGBA1C" in the last 72 hours. CBG: Recent Labs  Lab 09/04/22 1319 09/04/22 1512 09/04/22 1708 09/04/22 2057 09/05/22 0818  GLUCAP 88 93 126* 317* 249*   Lipid Profile: No results for input(s): "CHOL", "HDL", "LDLCALC", "TRIG", "CHOLHDL", "LDLDIRECT" in the last 72 hours. Thyroid Function Tests: No results for input(s): "TSH", "T4TOTAL", "FREET4", "T3FREE", "THYROIDAB" in the last 72 hours. Anemia Panel: Recent Labs    09/05/22 0243  VITAMINB12 572  FOLATE 9.5  FERRITIN 885*  TIBC 181*  IRON 19*  RETICCTPCT 3.0   Sepsis Labs: Recent Labs  Lab 09/03/22 1216 09/03/22 1400  LATICACIDVEN 1.8 1.5    Recent Results (from the past 240 hour(s))  WOUND CULTURE     Status: None (Preliminary result)   Collection Time: 09/03/22 12:06 PM   Specimen: Wound  Result Value Ref Range Status   MICRO NUMBER: [id number]  Preliminary   SPECIMEN QUALITY: Adequate  Preliminary   SOURCE: WOUND (SITE NOT SPECIFIED)  Preliminary   STATUS: PRELIMINARY  Preliminary   GRAM STAIN:   Preliminary    No white blood cells seen No epithelial cells seen Many Gram negative bacilli  Blood Cultures x 2 sites     Status: None (Preliminary result)   Collection Time: 09/03/22  1:30 PM   Specimen: BLOOD  Result Value Ref Range Status   Specimen Description BLOOD SITE NOT SPECIFIED  Final  Special Requests   Final    BOTTLES DRAWN AEROBIC AND ANAEROBIC Blood Culture results may not be optimal due to an inadequate volume of blood received in culture bottles   Culture   Final    NO GROWTH 2 DAYS Performed at [facility], [address], [place], [place] [address]    Report Status PENDING  Incomplete  Blood Cultures x 2 sites     Status: None (Preliminary result)   Collection Time: 09/03/22  1:40 PM   Specimen:  BLOOD  Result Value Ref Range Status   Specimen Description BLOOD SITE NOT SPECIFIED  Final   Special Requests   Final    BOTTLES DRAWN AEROBIC AND ANAEROBIC Blood Culture adequate volume   Culture   Final    NO GROWTH 2 DAYS Performed at [facility], [address], [place], [place] [address]    Report Status PENDING  Incomplete  Surgical pcr screen     Status: None   Collection Time: 09/04/22  5:01 AM   Specimen: Nasal Mucosa; Nasal Swab  Result Value Ref Range Status   MRSA, PCR NEGATIVE NEGATIVE Final   Staphylococcus aureus NEGATIVE NEGATIVE Final    Comment: (NOTE) The Xpert SA Assay (FDA approved for NASAL specimens in patients 28 years of age and older), is one component of a comprehensive surveillance program. It is not intended to diagnose infection nor to guide or monitor treatment. Performed at [facility], [address] [place], [place] [address]   Aerobic/Anaerobic Culture w Gram Stain (surgical/deep wound)     Status: None (Preliminary result)   Collection Time: 09/04/22  2:56 PM   Specimen: PATH Digit amputation; Tissue  Result Value Ref Range Status   Specimen Description WOUND  Final   Special Requests RESIDUAL FIRST METATARSAL  Final   Gram Stain   Final    RARE WBC PRESENT, PREDOMINANTLY PMN NO ORGANISMS SEEN Performed at [facility], [address], [place], [place] [address]    Culture PENDING  Incomplete   Report Status PENDING  Incomplete     Radiology Studies: MR FOOT LEFT WO CONTRAST  Result Date: 09/03/2022 CLINICAL DATA:  Left transmetatarsal amputation 08/15/2022. Wound infection. History of end-stage renal disease and diabetes. EXAM: MRI OF THE LEFT FOOT WITHOUT CONTRAST TECHNIQUE: Multiplanar, multisequence MR imaging of the left foot was performed. No intravenous contrast was administered. COMPARISON:  Radiographs 09/03/2022 and 08/13/2022. No previous MRI. FINDINGS: Bones/Joint/Cartilage Postsurgical changes from recent  transmetatarsal amputation. Skin staples remain in place, causing some susceptibility artifact. There is nonspecific marrow edema in each of the remaining metatarsals adjacent to the amputation margins. No gross cortical destruction. Mild degenerative changes at the Lisfranc joint without subluxation. No suspicious tarsal bone findings. No significant joint effusions. Ligaments Intact Lisfranc ligament. Muscles and Tendons Nonspecific diffuse muscular edema which may relate to diabetes. No intramuscular fluid collections are demonstrated. Soft tissues Nonspecific ill-defined fluid adjacent to the metatarsal stumps, greatest medially. No well-defined fluid collections are identified. There is mild nonspecific generalized subcutaneous edema. IMPRESSION: 1. Postsurgical changes from recent transmetatarsal amputation. 2. Nonspecific marrow edema in each of the remaining metatarsals adjacent to the amputation margins without gross cortical destruction. This could be reactive or secondary to early osteomyelitis. 3. Nonspecific ill-defined fluid adjacent to the metatarsal stumps, greatest medially. No well-defined fluid collections are identified. 4. Nonspecific diffuse muscular edema which may relate to diabetes. Electronically Signed   By: Hilarie Fredrickson.D.  On: 09/03/2022 16:02   DG Foot 2 Views Left  Result Date: 09/03/2022 CLINICAL DATA:  Infection.  Open wound. EXAM: LEFT FOOT - 2 VIEW COMPARISON:  Left foot radiographs 08/13/2022 FINDINGS: Interval amputation of the first through fifth digits to the proximal metatarsal shafts. There is bandage material overlying the distal soft tissues with postsurgical skin staples. The surgical bone resection margins appear fairly sharp. The acuity of the surgery and lack of expected sclerosis limit evaluation for early erosions. On lateral view the distal surgical wound appears to extend to the remaining distal bone. Moderate plantar and posterior calcaneal heel spurs.  High-grade atherosclerotic calcifications. Additional distal calf subcutaneous fat lobule calcifications from likely chronic venous stasis, similar to prior. IMPRESSION: 1. Interval amputation of the first through fifth digits to the proximal metatarsal shafts. 2. There is bandage material overlying the distal soft tissues with postsurgical skin staples. The distal surgical wound appears to extend to the remaining distal bone. The surgical bone resection margins appear fairly sharp, and no definitive radiographic evidence of osteomyelitis is seen. Recommend clinical correlation for evidence of infection contacting bone. Electronically Signed   By: Neita Garnet M.D.   On: 09/03/2022 13:53    Scheduled Meds:  (feeding supplement) PROSource Plus  30 mL Oral BID BM   aspirin  81 mg Oral Daily   Chlorhexidine Gluconate Cloth  6 each Topical Q0600   cinacalcet  90 mg Oral Once per day on Mon Wed Fri   DULoxetine  60 mg Oral Daily   ezetimibe  10 mg Oral Daily   furosemide  80 mg Oral BID   heparin  5,000 Units Subcutaneous Q12H   insulin aspart  0-5 Units Subcutaneous QHS   insulin aspart  0-9 Units Subcutaneous TID WC   insulin glargine-yfgn  15 Units Subcutaneous Daily   ketotifen  1 drop Both Eyes BID   metoprolol succinate  50 mg Oral Daily   metroNIDAZOLE  500 mg Oral Q12H   pregabalin  50 mg Oral Daily   sertraline  50 mg Oral Daily   sucroferric oxyhydroxide  1,000 mg Oral TID WC   And   sucroferric oxyhydroxide  500 mg Oral Daily   Continuous Infusions:  ceFEPime (MAXIPIME) IV 1 g (09/04/22 2205)   vancomycin      LOS: 2 days   Marguerita Merles, DO [facility] Available via Epic secure chat 7am-7pm After these hours, please refer to coverage provider listed on [email or web address] 09/05/2022, 10:04 AM

## 2022-09-06 DIAGNOSIS — L089 Local infection of the skin and subcutaneous tissue, unspecified: Secondary | ICD-10-CM | POA: Diagnosis not present

## 2022-09-06 DIAGNOSIS — Z89432 Acquired absence of left foot: Secondary | ICD-10-CM | POA: Diagnosis not present

## 2022-09-06 DIAGNOSIS — N186 End stage renal disease: Secondary | ICD-10-CM | POA: Diagnosis not present

## 2022-09-06 DIAGNOSIS — M86672 Other chronic osteomyelitis, left ankle and foot: Secondary | ICD-10-CM | POA: Diagnosis not present

## 2022-09-06 LAB — GLUCOSE, CAPILLARY
Glucose-Capillary: 308 mg/dL — ABNORMAL HIGH (ref 70–99)
Glucose-Capillary: 320 mg/dL — ABNORMAL HIGH (ref 70–99)
Glucose-Capillary: 303 mg/dL — ABNORMAL HIGH (ref 70–99)

## 2022-09-06 LAB — CBC WITH DIFFERENTIAL/PLATELET
Abs Immature Granulocytes: 0.09 10*3/uL — ABNORMAL HIGH (ref 0.00–0.07)
Basophils Absolute: 0.1 10*3/uL (ref 0.0–0.1)
Basophils Relative: 0 %
Eosinophils Absolute: 0.1 10*3/uL (ref 0.0–0.5)
Eosinophils Relative: 1 %
HCT: 27.1 % — ABNORMAL LOW (ref 36.0–46.0)
Hemoglobin: 8.2 g/dL — ABNORMAL LOW (ref 12.0–15.0)
Immature Granulocytes: 1 %
Lymphocytes Relative: 11 %
Lymphs Abs: 1.5 10*3/uL (ref 0.7–4.0)
MCH: 24 pg — ABNORMAL LOW (ref 26.0–34.0)
MCHC: 30.3 g/dL (ref 30.0–36.0)
MCV: 79.2 fL — ABNORMAL LOW (ref 80.0–100.0)
Monocytes Absolute: 1.3 10*3/uL — ABNORMAL HIGH (ref 0.1–1.0)
Monocytes Relative: 9 %
Neutro Abs: 11.3 10*3/uL — ABNORMAL HIGH (ref 1.7–7.7)
Neutrophils Relative %: 78 %
Platelets: 315 10*3/uL (ref 150–400)
RBC: 3.42 MIL/uL — ABNORMAL LOW (ref 3.87–5.11)
RDW: 18.8 % — ABNORMAL HIGH (ref 11.5–15.5)
WBC: 14.4 10*3/uL — ABNORMAL HIGH (ref 4.0–10.5)
nRBC: 0.1 % (ref 0.0–0.2)

## 2022-09-06 LAB — COMPREHENSIVE METABOLIC PANEL
ALT: 20 U/L (ref 0–44)
AST: 12 U/L — ABNORMAL LOW (ref 15–41)
Albumin: 2.7 g/dL — ABNORMAL LOW (ref 3.5–5.0)
Alkaline Phosphatase: 89 U/L (ref 38–126)
Anion gap: 14 (ref 5–15)
BUN: 63 mg/dL — ABNORMAL HIGH (ref 8–23)
CO2: 25 mmol/L (ref 22–32)
Calcium: 9.1 mg/dL (ref 8.9–10.3)
Chloride: 97 mmol/L — ABNORMAL LOW (ref 98–111)
Creatinine, Ser: 6.57 mg/dL — ABNORMAL HIGH (ref 0.44–1.00)
GFR, Estimated: 6 mL/min — ABNORMAL LOW (ref >60)
Glucose, Bld: 251 mg/dL — ABNORMAL HIGH (ref 70–99)
Potassium: 3.6 mmol/L (ref 3.5–5.1)
Sodium: 136 mmol/L (ref 135–145)
Total Bilirubin: 0.7 mg/dL (ref 0.3–1.2)
Total Protein: 6.9 g/dL (ref 6.5–8.1)

## 2022-09-06 LAB — CULTURE, BLOOD (ROUTINE X 2)

## 2022-09-06 LAB — PHOSPHORUS: Phosphorus: 4.5 mg/dL (ref 2.5–4.6)

## 2022-09-06 LAB — MAGNESIUM: Magnesium: 1.9 mg/dL (ref 1.7–2.4)

## 2022-09-06 LAB — HEPATITIS B SURFACE ANTIGEN: Hepatitis B Surface Ag: NONREACTIVE

## 2022-09-06 NOTE — Plan of Care (Signed)
  Problem: Education: Goal: Knowledge of General Education information will improve Description: Including pain rating scale, medication(s)/side effects and non-pharmacologic comfort measures Outcome: Progressing   Problem: Clinical Measurements: Goal: Will remain free from infection Outcome: Progressing   Problem: Activity: Goal: Risk for activity intolerance will decrease Outcome: Progressing   Problem: Safety: Goal: Ability to remain free from injury will improve Outcome: Progressing   

## 2022-09-06 NOTE — [place] FL2 (Signed)
[place] MEDICAID FL2 LEVEL OF CARE FORM     IDENTIFICATION  Patient Name: Kathryn Beck Birthdate: Jun 11, 1955 Sex: female Admission Date (Current Location): 09/03/2022  [facility] and [place] Number:  [employer] and Address:  [facility] [place], [address], [place], [place] [address]      Provider Number: [id number]  Attending Physician Name and Address:  Merlene Laughter, DO  Relative Name and Phone Number:       Current Level of Care: Hospital Recommended Level of Care: Skilled Nursing Facility Prior Approval Number:    Date Approved/Denied:   PASRR Number: [id number]  Discharge Plan: SNF    Current Diagnoses: Patient Active Problem List   Diagnosis Date Noted   Osteomyelitis (HCC) 09/03/2022   Left foot infection 09/03/2022   Status post transmetatarsal amputation of foot, left (HCC) 09/03/2022   Wound infection after surgery 09/03/2022   Equinus contracture of left ankle 08/15/2022   Acute osteomyelitis of toe of left foot (HCC) 08/13/2022   Peripheral vascular complication 07/21/2022   PVD (peripheral vascular disease) (HCC) 07/18/2022   Toe ulcer, left, limited to breakdown of skin (HCC) 07/13/2022   S/P BKA (below knee amputation), right (HCC) 05/29/2022   Confusion 05/19/2022   Visual hallucinations 05/19/2022   Watery eyes 05/19/2022   Requires assistance with activities of daily living (ADL) 04/26/2022   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   Gangrene of right foot (HCC) 04/25/2022   PAD (peripheral artery disease) (HCC) 04/25/2022   Other osteoporosis without current pathological fracture 11/10/2021   Hypocalcemia 11/10/2021   Fall 11/02/2021   Left foot pain 10/31/2021   Low back pain with left-sided sciatica 10/31/2021   Left ear impacted cerumen 09/18/2021   Chronic left shoulder pain 06/10/2021   Abnormal CXR 03/10/2021   Ear pain, bilateral 03/10/2021   Morbid obesity (HCC) 03/10/2021    Subungual hematoma of second toe of left foot 10/28/2020   Insomnia 10/28/2020   Memory loss or impairment 09/26/2020   Forgetfulness 09/26/2020   Recurrent vertigo 09/26/2020   Abnormal MRI, cervical spine 03/04/2020   Right leg weakness 02/28/2020   Knee pain, right 10/23/2019   Cough 08/09/2019   Carpal tunnel syndrome 06/13/2019   Chronic pain syndrome 06/13/2019   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) 06/13/2019   Neck pain 03/23/2019   Near syncope 12/05/2018   ESRD on hemodialysis (HCC) 11/22/2018   Ischemic heart disease 09/25/2018   Not currently working due to disabled status 06/13/2018   HTN (hypertension) 06/12/2018   Depression 03/09/2018   Adrenal mass, left (HCC) 11/09/2017   MGUS (monoclonal gammopathy of unknown significance) 11/05/2017   Shoulder pain 11/01/2017   Chronic diastolic (congestive) heart failure (HCC) 09/20/2017   Bilateral leg weakness 09/09/2017   Adrenal adenoma 09/03/2017   Uncontrolled type 2 diabetes mellitus 08/18/2017   Unsteady gait 07/11/2017   Recurrent falls 07/11/2017   Anemia of chronic disease 03/24/2017   Personal history of colonic polyps 02/10/2017   Dysphagia 11/05/2016   Irritable bowel syndrome 02/04/2016   Hospital discharge follow-up 10/27/2015   Intolerant of heat 10/07/2014   Cardiac murmur 01/17/2014   Back pain with left-sided radiculopathy 09/18/2013   Seasonal allergies 03/10/2013   Lipoma of back 12/22/2012   Other seasonal allergic rhinitis 08/22/2012   OSA (obstructive sleep apnea) 06/02/2011   Chronic kidney disease, stage 4 (severe) (HCC) 01/13/2011   Neuropathy 04/16/2010   Malaise and fatigue  07/24/2009   Personal history of other diseases of the nervous system and sense organs 05/08/2009   LUPUS ERYTHEMATOSUS, DISCOID 06/05/2008   Arm pain 05/30/2008   Hyperlipidemia 06/07/2007   Obesity 06/07/2007   Malignant hypertension 06/07/2007   Type 2 diabetes mellitus with hyperglycemia  (HCC) 06/07/2007   Diabetes mellitus (HCC) 06/07/2007    Orientation RESPIRATION BLADDER Height & Weight     Self, Time, Situation, Place  Normal Continent Weight: 175 lb 0.7 oz (79.4 kg) Height:  5\' 4"  (162.6 cm)  BEHAVIORAL SYMPTOMS/MOOD NEUROLOGICAL BOWEL NUTRITION STATUS      Continent Diet (see dc summary)  AMBULATORY STATUS COMMUNICATION OF NEEDS Skin   Extensive Assist Verbally Surgical wounds (Lft leg 09/04/22)                       Personal Care Assistance Level of Assistance  Bathing, Feeding, Dressing Bathing Assistance: Limited assistance Feeding assistance: Independent Dressing Assistance: Limited assistance     Functional Limitations Info  Sight, Hearing, Speech Sight Info: Adequate Hearing Info: Adequate Speech Info: Adequate    SPECIAL CARE FACTORS FREQUENCY  PT (By licensed PT), OT (By licensed OT)     PT Frequency: 5x week OT Frequency: 5x week            Contractures Contractures Info: Not present    Additional Factors Info  Code Status, Allergies Code Status Info: Full Allergies Info: Ace Inhibitors,Penicillins,Statins,Albuterol           Current Medications (09/06/2022):  This is the current hospital active medication list Current Facility-Administered Medications  Medication Dose Route Frequency Provider Last Rate Last Admin   (feeding supplement) PROSource Plus liquid 30 mL  30 mL Oral BID BM Julien Nordmann, PA-C   30 mL at 09/06/22 1610   aspirin chewable tablet 81 mg  81 mg Oral Daily Mikey College T, MD   81 mg at 09/06/22 0908   ceFEPIme (MAXIPIME) 1 g in sodium chloride 0.9 % 100 mL IVPB  1 g Intravenous Q24H Andreas Ohm, RPH 200 mL/hr at 09/05/22 2110 1 g at 09/05/22 2110   Chlorhexidine Gluconate Cloth 2 % PADS 6 each  6 each Topical Q0600 Delano Metz, MD   6 each at 09/06/22 9604   cinacalcet (SENSIPAR) tablet 90 mg  90 mg Oral Once per day on Mon Wed Fri Stovall, Kathryn R, PA-C       DULoxetine (CYMBALTA) DR  capsule 60 mg  60 mg Oral Daily Mikey College T, MD   60 mg at 09/06/22 5409   ezetimibe (ZETIA) tablet 10 mg  10 mg Oral Daily Mikey College T, MD   10 mg at 09/06/22 0908   furosemide (LASIX) tablet 80 mg  80 mg Oral BID Mikey College T, MD   80 mg at 09/06/22 0908   heparin injection 5,000 Units  5,000 Units Subcutaneous Q12H Emeline General, MD   5,000 Units at 09/06/22 8119   hydrOXYzine (ATARAX) tablet 25 mg  25 mg Oral Q12H PRN Mikey College T, MD       insulin aspart (novoLOG) injection 0-5 Units  0-5 Units Subcutaneous QHS Mikey College T, MD   3 Units at 09/05/22 2137   insulin aspart (novoLOG) injection 0-9 Units  0-9 Units Subcutaneous TID WC Emeline General, MD   2 Units at 09/06/22 0750   insulin glargine-yfgn [facility]) injection 15 Units  15 Units Subcutaneous Daily Arabella Merles, RPH   15 Units at  09/06/22 0911   ketotifen (ZADITOR) 0.035 % ophthalmic solution 1 drop  1 drop Both Eyes BID Mikey College T, MD   1 drop at 09/06/22 0909   metoprolol succinate (TOPROL-XL) 24 hr tablet 50 mg  50 mg Oral Daily Mikey College T, MD   50 mg at 09/06/22 0908   metroNIDAZOLE (FLAGYL) tablet 500 mg  500 mg Oral Q12H Gowens, Mariah L, PA-C   500 mg at 09/06/22 0908   oxyCODONE-acetaminophen (PERCOCET/ROXICET) 5-325 MG per tablet 1 tablet  1 tablet Oral Q4H PRN Emeline General, MD   1 tablet at 09/06/22 0908   pregabalin (LYRICA) capsule 50 mg  50 mg Oral Daily Mikey College T, MD   50 mg at 09/06/22 0908   sertraline (ZOLOFT) tablet 50 mg  50 mg Oral Daily Mikey College T, MD   50 mg at 09/06/22 0908   sucroferric oxyhydroxide ([name]) chewable tablet 1,000 mg  1,000 mg Oral TID WC Arabella Merles, RPH   1,000 mg at 09/06/22 0749   And   sucroferric oxyhydroxide ([name]) chewable tablet 500 mg  500 mg Oral Daily Arabella Merles, RPH   500 mg at 09/05/22 2136   vancomycin (VANCOREADY) IVPB 750 mg/150 mL  750 mg Intravenous Q M,W,F-HD Andreas Ohm, RPH       Facility-Administered Medications Ordered in Other  Encounters  Medication Dose Route Frequency Provider Last Rate Last Admin   0.9 %  sodium chloride infusion   Intravenous Continuous Lockamy, Randi L, NP-C 20 mL/hr at 05/29/22 0928 New Bag at 05/29/22 1013     Discharge Medications: Please see discharge summary for a list of discharge medications.  Relevant Imaging Results:  Relevant Lab Results:   Additional Information SSN [id number]  Carmina Miller, LCSWA

## 2022-09-06 NOTE — Progress Notes (Signed)
Pharmacy Antibiotic Note  Kathryn Beck is a 67 y.o. female admitted on 09/03/2022 with diabetic foot infection. S/p transmetatarsal amputation of left foot 08/15/2022. Patient with gross dehiscence of amputation site with purulent discharge sent to ED by podiatry for IV antibiotics and I&D. PMH significant for ESRD on HD MWF - last HD session 09/04/22 was short and preop. Next HD session scheduled for 09/07/22. Pharmacy has been consulted for vancomycin and cefepime dosing. Patient also on metronidazole 500 mg PO q12h.   Plan: Vancomycin 750 mg IV MWF post-HD  Cefepime 1g IV q24h  Monitor clinical progress and cultures  Monitor for HD schedule adjustments and missed doses  F/u podiatry recommendations   Height: 5\' 4"  (162.6 cm) Weight: 79.4 kg (175 lb 0.7 oz) IBW/kg (Calculated) : 54.7  Temp (24hrs), Avg:98.2 F (36.8 C), Min:97.7 F (36.5 C), Max:98.8 F (37.1 C)  Recent Labs  Lab 09/03/22 1216 09/03/22 1237 09/03/22 1400 09/04/22 0125 09/04/22 0127 09/04/22 1345 09/05/22 0243 09/06/22 0101  WBC  --  9.5  --  9.3  --   --  16.9* 14.4*  CREATININE  --  5.13*  --   --  5.95* 3.40* 4.87* 6.57*  LATICACIDVEN 1.8  --  1.5  --   --   --   --   --     Estimated Creatinine Clearance: 8.6 mL/min (A) (by C-G formula based on SCr of 6.57 mg/dL (H)).    Allergies  Allergen Reactions   Ace Inhibitors Anaphylaxis and Swelling   Penicillins Itching, Swelling and Other (See Comments)    Did it involve swelling of the face/tongue/throat, SOB, or low BP? Unknown Did it involve sudden or severe rash/hives, skin peeling, or any reaction on the inside of your mouth or nose? Unknown Did you need to seek medical attention at a hospital or doctor's office? Unknown When did it last happen?      years  If all above answers are "NO", may proceed with cephalosporin use.    Statins Other (See Comments)    elevated LFT's     Albuterol Swelling    Antimicrobials this admission: Vancomycin 6/13  >>  Cefepime 6/13 >>   Dose adjustments this admission: NA  Microbiology results: 6/13 BCx: NGTD 6/14 Wound Cx: reincubated  Thank you for allowing pharmacy to be a part of this patient's care.  March Rummage, PharmD, MS, BCPS PGY2 Pharmacy Resident 09/06/2022 11:35 AM

## 2022-09-06 NOTE — Progress Notes (Signed)
[facility] Progress Note   Subjective:  Seen in room - foot hurts, but otherwise feels ok - no CP/dyspnea.  Objective Vitals:   09/05/22 1650 09/05/22 1955 09/06/22 0531 09/06/22 1011  BP: (!) 132/50 (!) 142/54 (!) 129/52 (!) 138/48  Pulse: 62 71 62 71  Resp: 18 18 16 16   Temp: 98.8 F (37.1 C) 97.7 F (36.5 C) 97.8 F (36.6 C) 98.4 F (36.9 C)  TempSrc: Oral Oral  Oral  SpO2: 99% 95% 100% 100%  Weight:      Height:       Physical Exam General: Well appearing woman, NAD. Room air Heart: RRR; 3/6 murmur Lungs: CTA anteriorly Abdomen: soft Extremities: no LE edema, L foot bandaged with ACE wrap Dialysis Access:  LUE AVF + bruit  Additional Objective Labs: Basic Metabolic Panel: Recent Labs  Lab 09/04/22 0125 09/04/22 0127 09/04/22 1345 09/05/22 0243 09/06/22 0101  NA  --  140 139 138 136  K  --  3.5 3.5 3.6 3.6  CL  --  102 97* 98 97*  CO2  --  24  --  26 25  GLUCOSE  --  246* 95 240* 251*  BUN  --  53* 26* 42* 63*  CREATININE  --  5.95* 3.40* 4.87* 6.57*  CALCIUM  --  8.6*  --  8.8* 9.1  PHOS 5.1*  --   --  3.7 4.5   Liver Function Tests: Recent Labs  Lab 09/04/22 0125 09/05/22 0243 09/06/22 0101  AST 16 16 12*  ALT 22 23 20   ALKPHOS 96 96 89  BILITOT 0.7 0.7 0.7  PROT 6.7 6.8 6.9  ALBUMIN 2.9* 2.9* 2.7*   CBC: Recent Labs  Lab 09/03/22 1237 09/04/22 0125 09/04/22 1345 09/05/22 0243 09/06/22 0101  WBC 9.5 9.3  --  16.9* 14.4*  NEUTROABS 6.8 6.5  --  14.5* 11.3*  HGB 8.9* 8.1* 9.9* 8.1* 8.2*  HCT 29.8* 26.5* 29.0* 26.8* 27.1*  MCV 82.5 81.3  --  80.5 79.2*  PLT 354 350  --  347 315   Blood Culture    Component Value Date/Time   SDES WOUND 09/04/2022 1456   SPECREQUEST RESIDUAL FIRST METATARSAL 09/04/2022 1456   CULT  09/04/2022 1456    CULTURE REINCUBATED FOR BETTER GROWTH Performed at [facility], [address] [place], [place] [address]    REPTSTATUS PENDING 09/04/2022 1456   CBG: Recent Labs  Lab  09/04/22 2057 09/05/22 0818 09/05/22 1211 09/05/22 1649 09/05/22 1952  GLUCAP 317* 249* 264* 171* 296*   Iron Studies:  Recent Labs    09/05/22 0243  IRON 19*  TIBC 181*  FERRITIN 885*   Studies/Results: DG Foot Complete Left  Result Date: 09/05/2022 CLINICAL DATA:  [id number] Osteomyelitis of left foot [facility]) [id number] EXAM: LEFT FOOT - COMPLETE 3+ VIEW COMPARISON:  September 03, 2022 FINDINGS: Status post surgical amputation of the distal metatarsals. Surgical margins demonstrate slight increased lucency along the fifth, fourth and first metatarsal distally. Severe atherosclerotic calcifications. Diffuse soft tissue calcifications. Overlying surgical staples. IMPRESSION: Slight increased lucency along the surgical margins of the first, fourth and fifth metatarsals. This could reflect postsurgical changes versus osteomyelitis. Recommend close attention on follow-up. Electronically Signed   By: Meda Klinefelter M.D.   On: 09/05/2022 11:31   Medications:  ceFEPime (MAXIPIME) IV 1 g (09/05/22 2110)   vancomycin      (feeding supplement) PROSource Plus  30 mL Oral BID BM   aspirin  81 mg Oral Daily   Chlorhexidine Gluconate Cloth  6 each Topical Q0600   cinacalcet  90 mg Oral Once per day on Mon Wed Fri   DULoxetine  60 mg Oral Daily   ezetimibe  10 mg Oral Daily   furosemide  80 mg Oral BID   heparin  5,000 Units Subcutaneous Q12H   insulin aspart  0-5 Units Subcutaneous QHS   insulin aspart  0-9 Units Subcutaneous TID WC   insulin glargine-yfgn  15 Units Subcutaneous Daily   ketotifen  1 drop Both Eyes BID   metoprolol succinate  50 mg Oral Daily   metroNIDAZOLE  500 mg Oral Q12H   pregabalin  50 mg Oral Daily   sertraline  50 mg Oral Daily   sucroferric oxyhydroxide  1,000 mg Oral TID WC   And   sucroferric oxyhydroxide  500 mg Oral Daily    Dialysis Orders: MWF at [facility] 3:45hr, 400/500, EDW 80.5kg, 2K/2.5Ca, UFP#2, LUE AVF, heparin 1000 unit initial + 1000 unit/hr -  Sensipar 90mg  q HD, no VDRA - Mircera IV q 2 weeks - last 09/02/22   Assessment/Plan: 1. L foot TMA dehiscence/infection: On Vanc/Cefepime, s/p debridement/revision 6/14. Intra op wound/bone Cx sent/pending. Per podiatry. 2. ESRD: Continue MWF schedule - next HD 6/17. 3. HTN/volume: BP controlled. Continue home meds, she is slightly below prior EDW. 4. Anemia of ESRD: Hgb 8.2 - given low dose ESA earlier this week, not due until 6/26. Transfuse prn 5. Secondary hyperparathyroidism: Ca/Phos ok - continue  [name] as binder, sensipar. 6. Nutrition:  Alb low, continue protein supp. 7. T2DM: Insulin per primary.  Ozzie Hoyle, PA-C 09/06/2022, 11:10 AM  [employer]

## 2022-09-06 NOTE — TOC Initial Note (Signed)
Transition of Care [facility]) - Initial/Assessment Note    Patient Details  Name: Kathryn Beck MRN: [id number] Date of Birth: 1955/06/15  [facility] CM/SW Contact:    Carmina Miller, LCSWA Phone Number: 09/06/2022, 10:58 AM  Clinical Narrative:                  CSW spoke with pt concerning SNF recommendation, pt agreeable, states preference for [facility], explained insurance auth process. Pt requesting assistance with DME-adult briefs and bed liners, CSW will relay message to [facility] to see if this can be requested now or will pt have to wait to dc from SNF.         Patient Goals and CMS Choice            Expected Discharge Plan and Services                                              Prior Living Arrangements/Services                       Activities of Daily Living Home Assistive Devices/Equipment: Wheelchair ADL Screening (condition at time of admission) Patient's cognitive ability adequate to safely complete daily activities?: Yes Is the patient deaf or have difficulty hearing?: No Does the patient have difficulty seeing, even when wearing glasses/contacts?: No Does the patient have difficulty concentrating, remembering, or making decisions?: No Patient able to express need for assistance with ADLs?: Yes Does the patient have difficulty dressing or bathing?: Yes Independently performs ADLs?: No Communication: Independent Does the patient have difficulty walking or climbing stairs?: Yes Weakness of Arms/Hands: None  Permission Sought/Granted                  Emotional Assessment              Admission diagnosis:  Osteomyelitis (HCC) [M86.9] Wound infection after surgery [T81.49XA] Left foot infection [L08.9] Status post transmetatarsal amputation of foot, left (HCC) [Q65.784] Patient Active Problem List   Diagnosis Date Noted   Osteomyelitis (HCC) 09/03/2022   Left foot infection 09/03/2022   Status post  transmetatarsal amputation of foot, left (HCC) 09/03/2022   Wound infection after surgery 09/03/2022   Equinus contracture of left ankle 08/15/2022   Acute osteomyelitis of toe of left foot (HCC) 08/13/2022   Peripheral vascular complication 07/21/2022   PVD (peripheral vascular disease) (HCC) 07/18/2022   Toe ulcer, left, limited to breakdown of skin (HCC) 07/13/2022   S/P BKA (below knee amputation), right (HCC) 05/29/2022   Confusion 05/19/2022   Visual hallucinations 05/19/2022   Watery eyes 05/19/2022   Requires assistance with activities of daily living (ADL) 04/26/2022   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   Gangrene of right foot (HCC) 04/25/2022   PAD (peripheral artery disease) (HCC) 04/25/2022   Other osteoporosis without current pathological fracture 11/10/2021   Hypocalcemia 11/10/2021   Fall 11/02/2021   Left foot pain 10/31/2021   Low back pain with left-sided sciatica 10/31/2021   Left ear impacted cerumen 09/18/2021   Chronic left shoulder pain 06/10/2021   Abnormal CXR 03/10/2021   Ear pain, bilateral 03/10/2021   Morbid obesity (HCC) 03/10/2021   Subungual hematoma of second toe of left foot 10/28/2020   Insomnia 10/28/2020   Memory loss or impairment 09/26/2020   Forgetfulness 09/26/2020  Recurrent vertigo 09/26/2020   Abnormal MRI, cervical spine 03/04/2020   Right leg weakness 02/28/2020   Knee pain, right 10/23/2019   Cough 08/09/2019   Carpal tunnel syndrome 06/13/2019   Chronic pain syndrome 06/13/2019   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) 06/13/2019   Neck pain 03/23/2019   Near syncope 12/05/2018   ESRD on hemodialysis (HCC) 11/22/2018   Ischemic heart disease 09/25/2018   Not currently working due to disabled status 06/13/2018   HTN (hypertension) 06/12/2018   Depression 03/09/2018   Adrenal mass, left (HCC) 11/09/2017   MGUS (monoclonal gammopathy of unknown significance) 11/05/2017   Shoulder pain  11/01/2017   Chronic diastolic (congestive) heart failure (HCC) 09/20/2017   Bilateral leg weakness 09/09/2017   Adrenal adenoma 09/03/2017   Uncontrolled type 2 diabetes mellitus 08/18/2017   Unsteady gait 07/11/2017   Recurrent falls 07/11/2017   Anemia of chronic disease 03/24/2017   Personal history of colonic polyps 02/10/2017   Dysphagia 11/05/2016   Irritable bowel syndrome 02/04/2016   Hospital discharge follow-up 10/27/2015   Intolerant of heat 10/07/2014   Cardiac murmur 01/17/2014   Back pain with left-sided radiculopathy 09/18/2013   Seasonal allergies 03/10/2013   Lipoma of back 12/22/2012   Other seasonal allergic rhinitis 08/22/2012   OSA (obstructive sleep apnea) 06/02/2011   Chronic kidney disease, stage 4 (severe) (HCC) 01/13/2011   Neuropathy 04/16/2010   Malaise and fatigue 07/24/2009   Personal history of other diseases of the nervous system and sense organs 05/08/2009   LUPUS ERYTHEMATOSUS, DISCOID 06/05/2008   Arm pain 05/30/2008   Hyperlipidemia 06/07/2007   Obesity 06/07/2007   Malignant hypertension 06/07/2007   Type 2 diabetes mellitus with hyperglycemia (HCC) 06/07/2007   Diabetes mellitus (HCC) 06/07/2007   PCP:  [name], MD Pharmacy:   [facility] - [place], [place] - [address] [place] [address] Phone: [phone] Fax: [phone]  [facility] - [place], [place] - [address] [place] [address] Phone: [phone] Fax: [phone]  [facility] - [place], [place] - [address] [place] [address] Phone: [phone] Fax: [phone]     Social Determinants of Health (SDOH) Social History: SDOH Screenings   Food Insecurity: No Food Insecurity (09/03/2022)  Housing: Low Risk  (07/18/2022)  Transportation Needs: No Transportation Needs (09/03/2022)  Utilities: Not At  Risk (09/03/2022)  Alcohol Screen: Low Risk  (05/12/2022)  Depression (PHQ2-9): Low Risk  (07/07/2022)  Recent Concern: Depression (PHQ2-9) - High Risk (05/12/2022)  Financial Resource Strain: Medium Risk (05/12/2022)  Physical Activity: Inactive (05/12/2022)  Social Connections: Socially Integrated (05/12/2022)  Stress: No Stress Concern Present (05/12/2022)  Tobacco Use: Low Risk  (09/05/2022)   SDOH Interventions:     Readmission Risk Interventions    05/01/2022    3:54 PM  Readmission Risk Prevention Plan  Transportation Screening Complete  HRI or Home Care Consult Complete  Palliative Care Screening Not Applicable  Medication Review (RN Care Manager) Complete

## 2022-09-06 NOTE — Progress Notes (Signed)
Subjective:  Patient ID: Kathryn Beck, female    DOB: [birth year],  MRN: [id number]  Patient POD#2 s/p left transmetatarsal amputation revision and irrigation and debridement. Relates some pain. Denis n/v/f/c.   Past Medical History:  Diagnosis Date   Acid reflux    Anemia    Arthritis    Axillary masses    Soft tissue - status post excision   Back pain    COVID-19 virus infection 04/06/2019   Depression    End-stage renal disease (HCC)    M/W/F dialysis   Essential hypertension    Headache    years ago   History of blood transfusion    History of cardiac catheterization    Normal coronary arteries October 2020   History of claustrophobia    History of pneumonia 2019   Hypoxia 04/03/2019   Memory loss    Mixed hyperlipidemia    Obesity    Pancreatitis    Peritoneal dialysis catheter in place [facility])    Pneumonia due to COVID-19 virus 04/02/2019   Sleep apnea    Noncompliant with CPAP   Stroke (HCC)    mini stroke   Type 2 diabetes mellitus (HCC)      Past Surgical History:  Procedure Laterality Date   ABDOMINAL AORTOGRAM W/LOWER EXTREMITY N/A 04/30/2022   Procedure: ABDOMINAL AORTOGRAM W/LOWER EXTREMITY;  Surgeon: Leonie Douglas, MD;  Location: [facility];  Service: Cardiovascular;  Laterality: N/A;   ABDOMINAL AORTOGRAM W/LOWER EXTREMITY N/A 07/21/2022   Procedure: ABDOMINAL AORTOGRAM W/LOWER EXTREMITY;  Surgeon: Nada Libman, MD;  Location: [facility];  Service: Cardiovascular;  Laterality: N/A;   ABDOMINAL HYSTERECTOMY     ACHILLES TENDON LENGTHENING  08/15/2022   Procedure: ACHILLES TENDON LENGTHENING;  Surgeon: [name], DPM;  Location: [facility] OR;  Service: Podiatry;;   AMPUTATION Right 05/29/2022   Procedure: RIGHT BELOW THE KNEE AMPUTATION;  Surgeon: [name], MD;  Location: [facility] OR;  Service: Orthopedics;  Laterality: Right;   AMPUTATION Left 09/04/2022   Procedure: AMPUTATION FOOT, serial irrigation;  Surgeon: Louann Sjogren, DPM;   Location: [facility] OR;  Service: Podiatry;  Laterality: Left;  Surgical team to do block   AV FISTULA PLACEMENT Left 09/02/2017   Procedure: creation of left arm ARTERIOVENOUS (AV) FISTULA;  Surgeon: Nada Libman, MD;  Location: [facility] OR;  Service: Vascular;  Laterality: Left;   COLONOSCOPY  2008   Dr. Darrick Penna: normal    COLONOSCOPY N/A 12/18/2016   Dr. Darrick Penna: multiple tubular adenomas, internal hemorrhoids. Surveillance in 3 years    ESOPHAGEAL DILATION N/A 10/13/2015   Procedure: ESOPHAGEAL DILATION;  Surgeon: Malissa Hippo, MD;  Location: [facility];  Service: Endoscopy;  Laterality: N/A;   ESOPHAGOGASTRODUODENOSCOPY N/A 10/13/2015   Dr. Karilyn Cota: chronic gastritis on path, no H.pylori. Empiric dilation    ESOPHAGOGASTRODUODENOSCOPY N/A 12/18/2016   Dr. Darrick Penna: mild gastritis. BRAVO study revealed uncontrolled GERD. Dysphagia secondary to uncontrolled reflux   FOOT SURGERY Bilateral    "nerve"     LEFT HEART CATH AND CORONARY ANGIOGRAPHY N/A 12/29/2018   Procedure: LEFT HEART CATH AND CORONARY ANGIOGRAPHY;  Surgeon: Corky Crafts, MD;  Location: [facility] INVASIVE CV LAB;  Service: Cardiovascular;  Laterality: N/A;   LOWER EXTREMITY ANGIOGRAPHY Right 05/04/2022   Procedure: Lower Extremity Angiography;  Surgeon: Victorino Sparrow, MD;  Location: [facility] INVASIVE CV LAB;  Service: Cardiovascular;  Laterality: Right;   LUNG BIOPSY     MASS EXCISION Right 01/09/2013  Procedure: EXCISION OF NEOPLASM OF RIGHT  AXILLA  AND EXCISION OF NEOPLASM OF LEFT AXILLA;  Surgeon: Dalia Heading, MD;  Location: [facility] ORS;  Service: General;  Laterality: Right;  procedure end @ 08:23   MYRINGOTOMY WITH TUBE PLACEMENT Bilateral 04/28/2017   Procedure: BILATERAL MYRINGOTOMY WITH TUBE PLACEMENT;  Surgeon: Newman Pies, MD;  Location: [facility] OR;  Service: ENT;  Laterality: Bilateral;   PERIPHERAL VASCULAR BALLOON ANGIOPLASTY Right 05/04/2022   Procedure: PERIPHERAL VASCULAR BALLOON ANGIOPLASTY;  Surgeon: Victorino Sparrow, MD;  Location:  [facility] INVASIVE CV LAB;  Service: Cardiovascular;  Laterality: Right;  PT   PERIPHERAL VASCULAR INTERVENTION Right 05/04/2022   Procedure: PERIPHERAL VASCULAR INTERVENTION;  Surgeon: Victorino Sparrow, MD;  Location: [facility] INVASIVE CV LAB;  Service: Cardiovascular;  Laterality: Right;  SFA   PERIPHERAL VASCULAR INTERVENTION Left 07/21/2022   Procedure: PERIPHERAL VASCULAR INTERVENTION;  Surgeon: Nada Libman, MD;  Location: [facility];  Service: Cardiovascular;  Laterality: Left;   REVISION OF ARTERIOVENOUS GORETEX GRAFT Left 05/04/2018   Procedure: TRANSPOSITION OF CEPHALIC VEIN ARTERIOVENOUS FISTULA LEFT ARM;  Surgeon: Larina Earthly, MD;  Location: [facility] OR;  Service: Vascular;  Laterality: Left;   SAVORY DILATION N/A 12/18/2016   Procedure: SAVORY DILATION;  Surgeon: West Bali, MD;  [facility]: [facility];  Service: Endoscopy;  Laterality: N/A;   TRANSMETATARSAL AMPUTATION Left 08/15/2022   Procedure: TRANSMETATARSAL AMPUTATION;  Surgeon: [name], DPM;  Location: [facility] OR;  Service: Podiatry;  Laterality: Left;       Latest Ref Rng & Units 09/06/2022    1:01 AM 09/05/2022    2:43 AM 09/04/2022    1:45 PM  CBC  WBC 4.0 - 10.5 K/uL 14.4  16.9    Hemoglobin 12.0 - 15.0 g/dL 8.2  8.1  9.9   Hematocrit 36.0 - 46.0 % 27.1  26.8  29.0   Platelets 150 - 400 K/uL 315  347         Latest Ref Rng & Units 09/06/2022    1:01 AM 09/05/2022    2:43 AM 09/04/2022    1:45 PM  BMP  Glucose 70 - 99 mg/dL 213  086  95   BUN 8 - 23 mg/dL 63  42  26   Creatinine 0.44 - 1.00 mg/dL 5.78  4.69  6.29   Sodium 135 - 145 mmol/L 136  138  139   Potassium 3.5 - 5.1 mmol/L 3.6  3.6  3.5   Chloride 98 - 111 mmol/L 97  98  97   CO2 22 - 32 mmol/L 25  26    Calcium 8.9 - 10.3 mg/dL 9.1  8.8       Objective:   Vitals:   09/06/22 0531 09/06/22 1011  BP: (!) 129/52 (!) 138/48  Pulse: 62 71  Resp: 16 16  Temp: 97.8 F (36.6 C) 98.4 F (36.9 C)  SpO2: 100% 100%    General:AA&O x 3. Normal mood  and affect   Vascular: DP and PT pulses 2/4 bilateral. Brisk capillary refill to all digits. Pedal hair present   Neruological. Epicritic sensation grossly intact.   Derm: Incison well coapted. No signs of infection or dehiscence noted.     MSK: MMT 5/5 in dorsiflexion, plantar flexion, inversion and eversion. Normal joint ROM without pain or crepitus.       Assessment & Plan:  Patient was evaluated and treated and all questions answered.  DX: POD#2 s/p left transmetatarsal amputation revision  Dressing changed at bedside.  Antibiotics: Appreciate ID reccs. Will follow cultures.  DME: CAM boot and NWB to lower extremity.   Discussed with patient diagnosis and treatment options.  Pending cultures patient will be ok for discharge and return to SNF. Advise to avoid any compression dressing on this foot while at SNF as this is likley what caused incision to open.  Podiatry will follow-up one week after discharge. Dressing to be clean dry and intact until then.   Louann Sjogren, DPM  Accessible via secure chat for questions or concerns.

## 2022-09-06 NOTE — Progress Notes (Signed)
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  She underwent a left foot TMA and went home with a wound VAC in 1 week was found to the patient developed an infection at the surgical site and she was started on doxycycline twice a day.  She continued to have constant bloody pus coming out of the left surgical wound and has severe neuropathy and does not feel a significant pain associate with a left foot wound.  She had to go see her podiatrist on a routine visit yesterday who evaluated and sent her to the ED for IV antibiotics and possible further surgical intervention given that her left foot continue to bleed and because the wound appeared to dehisce.    She was initiated on IV vancomycin, cefepime and Flagyl and podiatry was consulted and took the patient for a left foot transmetatarsal amputation revision with irrigation and debridement and infectious diseases consulted for further evaluation. She is POD 2 and complaining of Pain. Next HD is 09/07/22. Currently awaiting Cx's and they have been reincubated for better growth.   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 There is bandage material overlying the distal soft tissues with postsurgical skin staples. The distal surgical wound appears to extend to the remaining distal bone. The surgical bone resection margins appear fairly sharp, and no definitive radiographic evidence of osteomyelitis is seen. Recommend clinical correlation for evidence of infection contacting bone." -Discussed with on-call podiatrist PA, who ordered MRI -MRI done and showed "Postsurgical changes from recent transmetatarsal amputation. Nonspecific marrow edema in each of the remaining metatarsals adjacent to the amputation margins without gross cortical destruction. This could be reactive or secondary to early osteomyelitis. Nonspecific ill-defined fluid adjacent to the metatarsal stumps, greatest medially. No well-defined fluid collections are identified. Nonspecific diffuse muscular edema which may relate to diabetes." -WBC Trend, Lactic Acid and Inflammatory Marker Trend: Recent Labs  Lab 08/17/22 0737 08/18/22 0052 08/19/22 0637 09/03/22 1216 09/03/22 1237 09/03/22 1400 09/03/22 1645 09/04/22 0125 09/05/22 0243 09/06/22 0101  WBC 11.9* 11.6* 13.8*  --  9.5  --   --  9.3 16.9* 14.4*  LATICACIDVEN  --   --   --    < >  --  1.5  --   --   --   --   CRP  --   --   --   --   --   --  0.9  --   --   --    < > = values in this interval not displayed.  -ESR was 70 -Likelihood of losing left foot is high and the Admitter briefly discussed with patient family. -Podiatry consulted the patient for a left revision  of the transmetatarsal amputation with irrigation and debridement and she is POD 2 and Podiatry recommending CAM boot and NWB to Lower Extremity and following C/x and recommeding to avoid any compression dressing on the Left foot at SNF -ID consulted for further evaluation and recommendations and recommended continuing IV Vancomycin and IV Cefepime per HD and oral  Metronidazole 500 mg po BID and following up on Cx results to either decide continuing Abx with HD or Oral Abx and recommending continuing IV Abx if there are no Clear Margins  -Current Cx showing: Gram Stain RARE WBC PRESENT, PREDOMINANTLY PMN NO ORGANISMS SEEN  Culture CULTURE REINCUBATED FOR BETTER GROWTH Performed at [facility], [address] [place], [place] [address]  -PT/OT recommending SNF when Stable for D/C   PVD with HX of Right BKA -Underwent extensive treatment by vascular surgery including angiogram, intra-arterial lithotripsy and stenting recently -Continue Aspirin 81 mg po Daily; Plavix and not started for some reason and do not appear to be on her MAR now -Continue Ezetimibe 10 mg po Daily   -Will need to confirm Home Regimen with Pharmacy but appears to have a Statin Allergy so was not initiated in May due to allergy   IDDM with Insulin Resistance and Neuropathy -Most recent A1c 6.3 -C/w Pregablain 50 mg po Daily -Given her history of ESRD, will cut down long-acting insulin dosage and is now on Semglee 15 units sq Daily -C/w SSI Sensitive Novolog AC/HS -CBG Trend: Recent Labs  Lab 09/04/22 1512 09/04/22 1708 09/04/22 2057 09/05/22 0818 09/05/22 1211 09/05/22 1649 09/05/22 1952  GLUCAP 93 126* 317* 249* 264* 171* 296*    HTN -Continue Metoprolol Succinate 50 mg po Daily -Continue to Monitor BP per Protocol -Last BP reading was 129/52  HLD -C/w Ezetimibe 10 mg po Daily  ESRD on HD MWF -BUN/Cr Trend: Recent Labs  Lab 08/17/22 0737 08/19/22 0637 09/03/22 1237 09/04/22 0127 09/04/22 1345 09/05/22 0243 09/06/22 0101  BUN 43* 38* 36* 53* 26* 42* 63*  CREATININE 8.02* 7.11* 5.13* 5.95* 3.40* 4.87* 6.57*  -Nephrology consulted for further evaluation and patient was dialyzed on 09/04/22 with next HD Planned for tomorrow 09/07/22 -C/w Cinacalcet 90 mg po TIDwm and Furosemide 80 mg po BID as well as Sucroferric Oxyhydroxide 1000 mg po TIDwm and 500  mg po Custom -Avoid Nephrotoxic Medications, Contrast Dyes, Hypotension and Dehydration to Ensure Adequate Renal Perfusion and will need to Renally Adjust Meds -Continue to Monitor and Trend Renal Function carefully and repeat CMP in the AM   Microcytic Anemia/Anemia of Chronic Disease -Hgb/Hct Trend: Recent Labs  Lab 08/18/22 0052 08/19/22 0637 09/03/22 1237 09/04/22 0125 09/04/22 1345 09/05/22 0243 09/06/22 0101  HGB 8.2* 7.9* 8.9* 8.1* 9.9* 8.1* 8.2*  HCT 27.4* 26.0* 29.8* 26.5* 29.0* 26.8* 27.1*  MCV 80.6 80.7 82.5 81.3  --  80.5 79.2*  -Checked Anemia Panel and it showed an Iron level of 19, UIBC of 162, TIBC of 181, Saturation Ratios of 11%, Ferritin 885, Folate Level of 9.5, and Vitamin B12 Level of 572 -Continue to Monitor for S/Sx of Bleeding; no overt bleeding noted -Repeat CBC in the AM  Depression and Anxiety -C/w Sertraline 50 mg po Daily  Hypoalbuminemia -Patient's Albumin Trend: Recent Labs  Lab 08/15/22 0612 08/17/22 0737 08/19/22 0637 09/03/22 1237 09/04/22 0125 09/05/22 0243 09/06/22 0101  ALBUMIN 2.7* 2.9* 2.6* 3.4* 2.9* 2.9* 2.7*  -Continue to Monitor and Trend and repeat CMP in the AM  Obesity -Complicates overall prognosis and  care -Estimated body mass index is 30.05 kg/m as calculated from the following:   Height as of this encounter: 5\' 4"  (1.626 m).   Weight as of this encounter: 79.4 kg.  -Weight Loss and Dietary Counseling given   DVT prophylaxis: heparin injection 5,000 Units Start: 09/03/22 1530    Code Status: Full Code Family Communication: No family present at bedside   Disposition Plan:  Level of care: Med-Surg Status is: Inpatient Remains inpatient appropriate because: Awaiting Cx's and Final Abx Reccs and will need to go to SNF   Consultants:  Infectious Diseases Podiatry Nephrology  Procedures:  1. Left revision transmetatarsal amputation with irrigation and debridement   Antimicrobials:  Anti-infectives (From  admission, onward)    Start     Dose/Rate Route Frequency Ordered Stop   09/04/22 1200  vancomycin (VANCOREADY) IVPB 750 mg/150 mL        750 mg 150 mL/hr over 60 Minutes Intravenous Every M-W-F (Hemodialysis) 09/03/22 1415     09/03/22 2200  metroNIDAZOLE (FLAGYL) tablet 500 mg  Status:  Discontinued        500 mg Oral Every 12 hours 09/03/22 1520 09/03/22 1520   09/03/22 2000  ceFEPIme (MAXIPIME) 1 g in sodium chloride 0.9 % 100 mL IVPB        1 g 200 mL/hr over 30 Minutes Intravenous Every 24 hours 09/03/22 1415     09/03/22 1315  vancomycin (VANCOREADY) IVPB 2000 mg/400 mL        2,000 mg 200 mL/hr over 120 Minutes Intravenous  Once 09/03/22 1304 09/03/22 2222   09/03/22 1315  ceFEPIme (MAXIPIME) 2 g in sodium chloride 0.9 % 100 mL IVPB        2 g 200 mL/hr over 30 Minutes Intravenous  Once 09/03/22 1304 09/03/22 1817   09/03/22 1300  metroNIDAZOLE (FLAGYL) tablet 500 mg        500 mg Oral Every 12 hours 09/03/22 1251 09/10/22 0959       Subjective: Seen and examined at beside and she states that she slept half the night and was still in pain but thinks its getting better. No nausea or vomiting. Has not had a bowel movement today. No other concerns or complaints at this time. No CP or SOB.   Objective: Vitals:   09/05/22 1650 09/05/22 1955 09/06/22 0531 09/06/22 1011  BP: (!) 132/50 (!) 142/54 (!) 129/52 (!) 138/48  Pulse: 62 71 62 71  Resp: 18 18 16 16   Temp: 98.8 F (37.1 C) 97.7 F (36.5 C) 97.8 F (36.6 C) 98.4 F (36.9 C)  TempSrc: Oral Oral  Oral  SpO2: 99% 95% 100% 100%  Weight:      Height:        Intake/Output Summary (Last 24 hours) at 09/06/2022 1112 Last data filed at 09/05/2022 1655 Gross per 24 hour  Intake 240 ml  Output --  Net 240 ml   Filed Weights   09/03/22 1235 09/04/22 0838 09/04/22 1155  Weight: 78.9 kg 81.9 kg 79.4 kg   Examination: Physical Exam:  Constitutional: WN/WD obese AAF in NAD appears calm but is complaining of pain in the  Left foot still  Respiratory: Diminished to auscultation bilaterally with coarse breath sounds, no wheezing, rales, rhonchi or crackles. Normal respiratory effort and patient is not tachypenic. No accessory muscle use. Unlabored breathing  Cardiovascular: RRR, Has a 3/6/ murmur. S1 and S2 auscultated.  Abdomen: Soft, non-tender, Distended 2/2 body habitus. Bowel sounds positive.  GU: Deferred.  Musculoskeletal: No clubbing / cyanosis of digits/nails. Has a Right BKA and a Left Foot TMA that is wrapped and also has a LUE AVF Skin: No rashes, lesions, ulcers on a limited skin evaluation. No induration; Warm and dry.  Neurologic: CN 2-12 grossly intact with no focal deficits. Romberg sign and cerebellar reflexes not assessed.  Psychiatric: Normal judgment and insight. Alert and oriented x 3. Normal mood and appropriate affect.   Data Reviewed: I have personally reviewed following labs and imaging studies  CBC: Recent Labs  Lab 09/03/22 1237 09/04/22 0125 09/04/22 1345 09/05/22 0243 09/06/22 0101  WBC 9.5 9.3  --  16.9* 14.4*  NEUTROABS 6.8 6.5  --  14.5* 11.3*  HGB 8.9* 8.1* 9.9* 8.1* 8.2*  HCT 29.8* 26.5* 29.0* 26.8* 27.1*  MCV 82.5 81.3  --  80.5 79.2*  PLT 354 350  --  347 315   Basic Metabolic Panel: Recent Labs  Lab 09/03/22 1237 09/04/22 0125 09/04/22 0127 09/04/22 1345 09/05/22 0243 09/06/22 0101  NA 140  --  140 139 138 136  K 3.7  --  3.5 3.5 3.6 3.6  CL 101  --  102 97* 98 97*  CO2 24  --  24  --  26 25  GLUCOSE 164*  --  246* 95 240* 251*  BUN 36*  --  53* 26* 42* 63*  CREATININE 5.13*  --  5.95* 3.40* 4.87* 6.57*  CALCIUM 8.6*  --  8.6*  --  8.8* 9.1  MG  --  2.1  --   --  1.8 1.9  PHOS  --  5.1*  --   --  3.7 4.5   GFR: Estimated Creatinine Clearance: 8.6 mL/min (A) (by C-G formula based on SCr of 6.57 mg/dL (H)). Liver Function Tests: Recent Labs  Lab 09/03/22 1237 09/04/22 0125 09/05/22 0243 09/06/22 0101  AST 17 16 16  12*  ALT 24 22 23 20   ALKPHOS  96 96 96 89  BILITOT 0.4 0.7 0.7 0.7  PROT 7.6 6.7 6.8 6.9  ALBUMIN 3.4* 2.9* 2.9* 2.7*   No results for input(s): "LIPASE", "AMYLASE" in the last 168 hours. No results for input(s): "AMMONIA" in the last 168 hours. Coagulation Profile: No results for input(s): "INR", "PROTIME" in the last 168 hours. Cardiac Enzymes: No results for input(s): "CKTOTAL", "CKMB", "CKMBINDEX", "TROPONINI" in the last 168 hours. BNP (last 3 results) No results for input(s): "PROBNP" in the last 8760 hours. HbA1C: No results for input(s): "HGBA1C" in the last 72 hours. CBG: Recent Labs  Lab 09/04/22 2057 09/05/22 0818 09/05/22 1211 09/05/22 1649 09/05/22 1952  GLUCAP 317* 249* 264* 171* 296*   Lipid Profile: No results for input(s): "CHOL", "HDL", "LDLCALC", "TRIG", "CHOLHDL", "LDLDIRECT" in the last 72 hours. Thyroid Function Tests: No results for input(s): "TSH", "T4TOTAL", "FREET4", "T3FREE", "THYROIDAB" in the last 72 hours. Anemia Panel: Recent Labs    09/05/22 0243  VITAMINB12 572  FOLATE 9.5  FERRITIN 885*  TIBC 181*  IRON 19*  RETICCTPCT 3.0   Sepsis Labs: Recent Labs  Lab 09/03/22 1216 09/03/22 1400  LATICACIDVEN 1.8 1.5    Recent Results (from the past 240 hour(s))  WOUND CULTURE     Status: None (Preliminary result)   Collection Time: 09/03/22 12:06 PM   Specimen: Wound  Result Value Ref Range Status   MICRO NUMBER: [id number]  Preliminary   SPECIMEN QUALITY: Adequate  Preliminary   SOURCE: WOUND (SITE NOT SPECIFIED)  Preliminary   STATUS: PRELIMINARY  Preliminary  GRAM STAIN:   Preliminary    No white blood cells seen No epithelial cells seen Many Gram negative bacilli  Blood Cultures x 2 sites     Status: None (Preliminary result)   Collection Time: 09/03/22  1:30 PM   Specimen: BLOOD  Result Value Ref Range Status   Specimen Description BLOOD SITE NOT SPECIFIED  Final   Special Requests   Final    BOTTLES DRAWN AEROBIC AND ANAEROBIC Blood Culture results may not  be optimal due to an inadequate volume of blood received in culture bottles   Culture   Final    NO GROWTH 3 DAYS Performed at [facility], [address], [place], [place] [address]    Report Status PENDING  Incomplete  Blood Cultures x 2 sites     Status: None (Preliminary result)   Collection Time: 09/03/22  1:40 PM   Specimen: BLOOD  Result Value Ref Range Status   Specimen Description BLOOD SITE NOT SPECIFIED  Final   Special Requests   Final    BOTTLES DRAWN AEROBIC AND ANAEROBIC Blood Culture adequate volume   Culture   Final    NO GROWTH 3 DAYS Performed at [facility], [address], [place], [place] [address]    Report Status PENDING  Incomplete  Surgical pcr screen     Status: None   Collection Time: 09/04/22  5:01 AM   Specimen: Nasal Mucosa; Nasal Swab  Result Value Ref Range Status   MRSA, PCR NEGATIVE NEGATIVE Final   Staphylococcus aureus NEGATIVE NEGATIVE Final    Comment: (NOTE) The Xpert SA Assay (FDA approved for NASAL specimens in patients 29 years of age and older), is one component of a comprehensive surveillance program. It is not intended to diagnose infection nor to guide or monitor treatment. Performed at [facility], [address], [place], [place] [address]   Aerobic/Anaerobic Culture w Gram Stain (surgical/deep wound)     Status: None (Preliminary result)   Collection Time: 09/04/22  2:56 PM   Specimen: PATH Digit amputation; Tissue  Result Value Ref Range Status   Specimen Description WOUND  Final   Special Requests RESIDUAL FIRST METATARSAL  Final   Gram Stain   Final    RARE WBC PRESENT, PREDOMINANTLY PMN NO ORGANISMS SEEN    Culture   Final    CULTURE REINCUBATED FOR BETTER GROWTH Performed at [facility], [address] [place], [place] [address]    Report Status PENDING  Incomplete     Radiology Studies: DG Foot Complete Left  Result Date: 09/05/2022 CLINICAL DATA:  [id number] Osteomyelitis of left foot  [facility]) [id number] EXAM: LEFT FOOT - COMPLETE 3+ VIEW COMPARISON:  September 03, 2022 FINDINGS: Status post surgical amputation of the distal metatarsals. Surgical margins demonstrate slight increased lucency along the fifth, fourth and first metatarsal distally. Severe atherosclerotic calcifications. Diffuse soft tissue calcifications. Overlying surgical staples. IMPRESSION: Slight increased lucency along the surgical margins of the first, fourth and fifth metatarsals. This could reflect postsurgical changes versus osteomyelitis. Recommend close attention on follow-up. Electronically Signed   By: Meda Klinefelter M.D.   On: 09/05/2022 11:31    Scheduled Meds:  (feeding supplement) PROSource Plus  30 mL Oral BID BM   aspirin  81 mg Oral Daily   Chlorhexidine Gluconate Cloth  6 each Topical Q0600   cinacalcet  90 mg Oral Once per day on Mon Wed Fri   DULoxetine  60 mg  Oral Daily   ezetimibe  10 mg Oral Daily   furosemide  80 mg Oral BID   heparin  5,000 Units Subcutaneous Q12H   insulin aspart  0-5 Units Subcutaneous QHS   insulin aspart  0-9 Units Subcutaneous TID WC   insulin glargine-yfgn  15 Units Subcutaneous Daily   ketotifen  1 drop Both Eyes BID   metoprolol succinate  50 mg Oral Daily   metroNIDAZOLE  500 mg Oral Q12H   pregabalin  50 mg Oral Daily   sertraline  50 mg Oral Daily   sucroferric oxyhydroxide  1,000 mg Oral TID WC   And   sucroferric oxyhydroxide  500 mg Oral Daily   Continuous Infusions:  ceFEPime (MAXIPIME) IV 1 g (09/05/22 2110)   vancomycin      LOS: 3 days   Marguerita Merles, DO [facility] Available via Epic secure chat 7am-7pm After these hours, please refer to coverage provider listed on [email or web address] 09/06/2022, 11:12 AM

## 2022-09-07 DIAGNOSIS — N186 End stage renal disease: Secondary | ICD-10-CM | POA: Diagnosis not present

## 2022-09-07 DIAGNOSIS — Z89432 Acquired absence of left foot: Secondary | ICD-10-CM | POA: Diagnosis not present

## 2022-09-07 DIAGNOSIS — M86672 Other chronic osteomyelitis, left ankle and foot: Secondary | ICD-10-CM | POA: Diagnosis not present

## 2022-09-07 DIAGNOSIS — L089 Local infection of the skin and subcutaneous tissue, unspecified: Secondary | ICD-10-CM | POA: Diagnosis not present

## 2022-09-07 LAB — WOUND CULTURE
MICRO NUMBER:: 15079263
SPECIMEN QUALITY:: ADEQUATE

## 2022-09-07 LAB — CBC WITH DIFFERENTIAL/PLATELET
Abs Immature Granulocytes: 0.07 10*3/uL (ref 0.00–0.07)
Basophils Absolute: 0.1 10*3/uL (ref 0.0–0.1)
Basophils Relative: 1 %
Eosinophils Absolute: 0.2 10*3/uL (ref 0.0–0.5)
Eosinophils Relative: 2 %
HCT: 24.5 % — ABNORMAL LOW (ref 36.0–46.0)
Hemoglobin: 7.5 g/dL — ABNORMAL LOW (ref 12.0–15.0)
Immature Granulocytes: 1 %
Lymphocytes Relative: 14 %
Lymphs Abs: 1.9 10*3/uL (ref 0.7–4.0)
MCH: 24.4 pg — ABNORMAL LOW (ref 26.0–34.0)
MCHC: 30.6 g/dL (ref 30.0–36.0)
MCV: 79.8 fL — ABNORMAL LOW (ref 80.0–100.0)
Monocytes Absolute: 1.3 10*3/uL — ABNORMAL HIGH (ref 0.1–1.0)
Monocytes Relative: 10 %
Neutro Abs: 9.9 10*3/uL — ABNORMAL HIGH (ref 1.7–7.7)
Neutrophils Relative %: 72 %
Platelets: 311 10*3/uL (ref 150–400)
RBC: 3.07 MIL/uL — ABNORMAL LOW (ref 3.87–5.11)
RDW: 18.7 % — ABNORMAL HIGH (ref 11.5–15.5)
WBC: 13.5 10*3/uL — ABNORMAL HIGH (ref 4.0–10.5)
nRBC: 0.1 % (ref 0.0–0.2)

## 2022-09-07 LAB — COMPREHENSIVE METABOLIC PANEL
ALT: 17 U/L (ref 0–44)
AST: 11 U/L — ABNORMAL LOW (ref 15–41)
Albumin: 2.5 g/dL — ABNORMAL LOW (ref 3.5–5.0)
Alkaline Phosphatase: 89 U/L (ref 38–126)
Anion gap: 15 (ref 5–15)
BUN: 85 mg/dL — ABNORMAL HIGH (ref 8–23)
CO2: 25 mmol/L (ref 22–32)
Calcium: 9.3 mg/dL (ref 8.9–10.3)
Chloride: 98 mmol/L (ref 98–111)
Creatinine, Ser: 8.17 mg/dL — ABNORMAL HIGH (ref 0.44–1.00)
GFR, Estimated: 5 mL/min — ABNORMAL LOW (ref >60)
Glucose, Bld: 199 mg/dL — ABNORMAL HIGH (ref 70–99)
Potassium: 4.2 mmol/L (ref 3.5–5.1)
Sodium: 138 mmol/L (ref 135–145)
Total Bilirubin: 0.7 mg/dL (ref 0.3–1.2)
Total Protein: 6.6 g/dL (ref 6.5–8.1)

## 2022-09-07 LAB — MAGNESIUM: Magnesium: 2.3 mg/dL (ref 1.7–2.4)

## 2022-09-07 LAB — CULTURE, BLOOD (ROUTINE X 2)

## 2022-09-07 LAB — GLUCOSE, CAPILLARY
Glucose-Capillary: 172 mg/dL — ABNORMAL HIGH (ref 70–99)
Glucose-Capillary: 163 mg/dL — ABNORMAL HIGH (ref 70–99)
Glucose-Capillary: 148 mg/dL — ABNORMAL HIGH (ref 70–99)
Glucose-Capillary: 223 mg/dL — ABNORMAL HIGH (ref 70–99)
Glucose-Capillary: 298 mg/dL — ABNORMAL HIGH (ref 70–99)

## 2022-09-07 LAB — PHOSPHORUS: Phosphorus: 4.6 mg/dL (ref 2.5–4.6)

## 2022-09-07 LAB — AEROBIC/ANAEROBIC CULTURE W GRAM STAIN (SURGICAL/DEEP WOUND)

## 2022-09-07 MED ORDER — HEPARIN SODIUM (PORCINE) 1000 UNIT/ML DIALYSIS
2000.0000 [IU] | Freq: Once | INTRAMUSCULAR | Status: AC
  Administered 2022-09-07: 2000 [IU] via INTRAVENOUS_CENTRAL
  Filled 2022-09-07: qty 2

## 2022-09-07 MED ORDER — INSULIN GLARGINE-YFGN 100 UNIT/ML ~~LOC~~ SOLN
20.0000 [IU] | Freq: Every day | SUBCUTANEOUS | Status: DC
  Administered 2022-09-08: 20 [IU] via SUBCUTANEOUS
  Filled 2022-09-07: qty 0.2

## 2022-09-07 MED ORDER — INSULIN ASPART 100 UNIT/ML IJ SOLN
3.0000 [IU] | Freq: Three times a day (TID) | INTRAMUSCULAR | Status: DC
  Administered 2022-09-07 – 2022-09-08 (×3): 3 [IU] via SUBCUTANEOUS

## 2022-09-07 MED ORDER — DOXYCYCLINE HYCLATE 100 MG PO TABS
100.0000 mg | ORAL_TABLET | Freq: Two times a day (BID) | ORAL | Status: DC
  Administered 2022-09-07 – 2022-09-08 (×2): 100 mg via ORAL
  Filled 2022-09-07 (×2): qty 1

## 2022-09-07 NOTE — Progress Notes (Signed)
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 6/14 s/p Left revision transmetatarsal amputation with irrigation and debridement . 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 PMH: ESRD on HD, HTN, DM2, CVA, recent R LE BKA    PT Comments    Patient resting in bed at start of session, completed with lunch and agreeable to therapy. Pt reports does not feel too fatigued from dialysis today. Supine>sit with supervision and no cues needs for sequencing, pt relying on momentum to complete pivot. Pt able to scoot laterally at EOB with good use of UE's and cues for head to hip relationship to facilitate good hip direction for transfer. Pt completed LE exercises in recliner and attempted functional UE press ups to improve strength for lateral transfers. EOS pt agreeable to remain in recliner, Alarm on and call bell within reach and all needs met. Will progress as able.   Recommendations for follow up therapy are one component of a multi-disciplinary discharge planning process, led by the attending physician.  Recommendations may be updated based on patient status, additional functional criteria and insurance authorization.  Follow Up Recommendations  Can patient physically be transported by private vehicle: No    Assistance Recommended at Discharge Frequent or constant Supervision/Assistance  Patient can return home with the following A little help with walking and/or transfers;A little help with bathing/dressing/bathroom;Assistance with cooking/housework;Direct supervision/assist for medications management;Assist for transportation;Help with stairs or ramp for entrance   Equipment Recommendations  None recommended by PT    Recommendations for Other Services       Precautions /  Restrictions Precautions Precautions: Fall Required Braces or Orthoses: Other Brace Other Brace: CAM Boot for L LE Restrictions Weight Bearing Restrictions: Yes RLE Weight Bearing: Non weight bearing LLE Weight Bearing: Non weight bearing Other Position/Activity Restrictions: NWB to LLE primarily; Pt may utilize the CAM boot for transferring to and from bed/chair/wheelchair (per McDonald MD from Podiatry 5/27)     Mobility  Bed Mobility Overal bed mobility: Needs Assistance Bed Mobility: Supine to Sit     Supine to sit: Supervision, HOB elevated     General bed mobility comments: sup for safety, pt using bed features to move supine>sit, use of momentum.    Transfers Overall transfer level: Needs assistance Equipment used: None Transfers: Bed to chair/wheelchair/BSC            Lateral/Scoot Transfers: Min guard General transfer comment: close guarding for safety with transfer. pt with good use of UE's to weight shift for lateral scoot towards Rt side bed>chair. cues for head:hip relationship to improve direction and hip clearance.    Ambulation/Gait                   Stairs             Wheelchair Mobility    Modified Rankin (Stroke Patients Only)       Balance Overall balance assessment: Needs assistance Sitting-balance support: No upper extremity supported, Feet unsupported Sitting balance-Leahy Scale: Good                                      Cognition Arousal/Alertness: Awake/alert Behavior During Therapy: WFL for tasks assessed/performed Overall Cognitive Status: Within Functional Limits for  tasks assessed                                          Exercises General Exercises - Lower Extremity Gluteal Sets: AROM, Both, 10 reps, Strengthening Long Arc Quad: AROM, Both, 10 reps, Strengthening Hip Flexion/Marching: AROM, Both, 10 reps, Strengthening Other Exercises Other Exercises: bil UE shoulder/tricep  dip with hands placed next to hips. pt had difficulty coordinating and achieving adequate extension at elbow/shoulder.    General Comments        Pertinent Vitals/Pain Pain Assessment Pain Assessment: No/denies pain Pain Intervention(s): Limited activity within patient's tolerance, Monitored during session, Repositioned    Home Living                          Prior Function            PT Goals (current goals can now be found in the care plan section) Acute Rehab PT Goals Patient Stated Goal: Return to SNF for rehab PT Goal Formulation: With patient Time For Goal Achievement: 09/12/22 Potential to Achieve Goals: Good Progress towards PT goals: Progressing toward goals    Frequency    Min 2X/week      PT Plan Current plan remains appropriate    Co-evaluation              AM-PAC PT "6 Clicks" Mobility   Outcome Measure  Help needed turning from your back to your side while in a flat bed without using bedrails?: A Little Help needed moving from lying on your back to sitting on the side of a flat bed without using bedrails?: A Little Help needed moving to and from a bed to a chair (including a wheelchair)?: A Little Help needed standing up from a chair using your arms (e.g., wheelchair or bedside chair)?: Total Help needed to walk in hospital room?: Total Help needed climbing 3-5 steps with a railing? : Total 6 Click Score: 12    End of Session Equipment Utilized During Treatment: Gait belt Activity Tolerance: Patient tolerated treatment well Patient left: in chair;with call bell/phone within reach;with chair alarm set Nurse Communication: Mobility status;Other (comment) (a/p or lateral scoot at min a level appropriate (Nurse tech)) PT Visit Diagnosis: Muscle weakness (generalized) (M62.81);Difficulty in walking, not elsewhere classified (R26.2);Pain Pain - Right/Left: Left Pain - part of body: Ankle and joints of foot     Time: 1610-9604 PT  Time Calculation (min) (ACUTE ONLY): 20 min  Charges:  $Therapeutic Activity: 8-22 mins                     Wynn Maudlin, DPT Acute Rehabilitation Services Office [phone]  09/07/22 4:10 PM

## 2022-09-07 NOTE — TOC Progression Note (Signed)
Transition of Care [facility]) - Progression Note    Patient Details  Name: Kathryn Beck MRN: [id number] Date of Birth: 1955/04/21  Transition of Care [facility]) CM/SW Contact  Erin Sons, [place] Phone Number: 09/07/2022, 1:34 PM  Clinical Narrative:     CSW confirmed bed offer with [facility]. TOC to initiate auth once updated PT notes available.   Expected Discharge Plan: Skilled Nursing Facility Barriers to Discharge: Continued Medical Work up  Expected Discharge Plan and Services In-house Referral: Clinical Social Work     Living arrangements for the past 2 months: Single Family Home                                       Social Determinants of Health (SDOH) Interventions SDOH Screenings   Food Insecurity: No Food Insecurity (09/03/2022)  Housing: Low Risk  (07/18/2022)  Transportation Needs: No Transportation Needs (09/03/2022)  Utilities: Not At Risk (09/03/2022)  Alcohol Screen: Low Risk  (05/12/2022)  Depression (PHQ2-9): Low Risk  (07/07/2022)  Recent Concern: Depression (PHQ2-9) - High Risk (05/12/2022)  Financial Resource Strain: Medium Risk (05/12/2022)  Physical Activity: Inactive (05/12/2022)  Social Connections: Socially Integrated (05/12/2022)  Stress: No Stress Concern Present (05/12/2022)  Tobacco Use: Low Risk  (09/05/2022)    Readmission Risk Interventions    05/01/2022    3:54 PM  Readmission Risk Prevention Plan  Transportation Screening Complete  HRI or Home Care Consult Complete  Palliative Care Screening Not Applicable  Medication Review (RN Care Manager) Complete

## 2022-09-07 NOTE — Inpatient Diabetes Management (Signed)
Inpatient Diabetes Program Recommendations  AACE/ADA: New Consensus Statement on Inpatient Glycemic Control (2015)  Target Ranges:  Prepandial:   less than 140 mg/dL      Peak postprandial:   less than 180 mg/dL (1-2 hours)      Critically ill patients:  140 - 180 mg/dL   Lab Results  Component Value Date   GLUCAP 163 (H) 09/07/2022   HGBA1C 6.3 (H) 07/07/2022    Review of Glycemic Control  Latest Reference Range & Units 09/06/22 07:34 09/06/22 11:24 09/06/22 17:08 09/06/22 19:29 09/07/22 08:09  Glucose-Capillary 70 - 99 mg/dL 960 (H) 454 (H) 098 (H) 303 (H) 163 (H)  (H): Data is abnormally high Diabetes history: Type 2 DM Outpatient Diabetes medications: Fiasp 10-20 units TID, Toujeo 30 units QD Current orders for Inpatient glycemic control: Novolog 0-9 units TID & HS, Semglee 15 units QD  Inpatient Diabetes Program Recommendations:    Consider: -Increasing Semglee 20 units every day -Adding Novolog 3 units TID (Assuming patient consuming >50% of meals)  Thanks, Lujean Rave, MSN, RNC-OB Diabetes Coordinator [phone] (8a-5p)

## 2022-09-07 NOTE — Progress Notes (Signed)
Received patient in bed.Awake,alert and oriented x 4 .  Medicine given : Heparin 2,000 units pre-run bolus.  Access used :Left arm AVF that worked well.  Duration of treatment: 3.5 hours.  Fluid removed :  Achieved UF goal of1.5 liters.  Hemo comment:Tolerated treatment. Hand off to the patient's nurse.

## 2022-09-07 NOTE — Progress Notes (Signed)
[facility] Progress Note   Subjective:  Seen on dialysis- foot much less painful now.    Objective Vitals:   09/06/22 1932 09/07/22 0504 09/07/22 0829 09/07/22 0900  BP: (!) 122/54 (!) 149/59 (!) 140/63 (!) 154/58  Pulse: 72 61 61 70  Resp: 18 18 13 16   Temp: 98.5 F (36.9 C) 98 F (36.7 C) 97.7 F (36.5 C)   TempSrc:  Oral    SpO2: 99% 99% 100% 100%  Weight:   81.2 kg   Height:       Physical Exam General: Well appearing woman, NAD. Room air Heart: RRR; 3/6 murmur Lungs: CTA anteriorly Abdomen: soft Extremities: no LE edema, L foot in dressing Dialysis Access:  LUE AVF + bruit  Additional Objective Labs: Basic Metabolic Panel: Recent Labs  Lab 09/05/22 0243 09/06/22 0101 09/07/22 0120  NA 138 136 138  K 3.6 3.6 4.2  CL 98 97* 98  CO2 26 25 25   GLUCOSE 240* 251* 199*  BUN 42* 63* 85*  CREATININE 4.87* 6.57* 8.17*  CALCIUM 8.8* 9.1 9.3  PHOS 3.7 4.5 4.6   Liver Function Tests: Recent Labs  Lab 09/05/22 0243 09/06/22 0101 09/07/22 0120  AST 16 12* 11*  ALT 23 20 17   ALKPHOS 96 89 89  BILITOT 0.7 0.7 0.7  PROT 6.8 6.9 6.6  ALBUMIN 2.9* 2.7* 2.5*   CBC: Recent Labs  Lab 09/03/22 1237 09/04/22 0125 09/04/22 1345 09/05/22 0243 09/06/22 0101 09/07/22 0120  WBC 9.5 9.3  --  16.9* 14.4* 13.5*  NEUTROABS 6.8 6.5  --  14.5* 11.3* 9.9*  HGB 8.9* 8.1*   < > 8.1* 8.2* 7.5*  HCT 29.8* 26.5*   < > 26.8* 27.1* 24.5*  MCV 82.5 81.3  --  80.5 79.2* 79.8*  PLT 354 350  --  347 315 311   < > = values in this interval not displayed.   Blood Culture    Component Value Date/Time   SDES WOUND 09/04/2022 1456   SPECREQUEST RESIDUAL FIRST METATARSAL 09/04/2022 1456   CULT  09/04/2022 1456    RARE ENTEROBACTER HORMAECHEI SUSCEPTIBILITIES TO FOLLOW NO ANAEROBES ISOLATED; CULTURE IN PROGRESS FOR 5 DAYS    REPTSTATUS PENDING 09/04/2022 1456   CBG: Recent Labs  Lab 09/06/22 0734 09/06/22 1124 09/06/22 1708 09/06/22 1929 09/07/22 0809   GLUCAP 172* 308* 320* 303* 163*   Iron Studies:  Recent Labs    09/05/22 0243  IRON 19*  TIBC 181*  FERRITIN 885*   Studies/Results: No results found. Medications:  ceFEPime (MAXIPIME) IV 1 g (09/06/22 2011)   vancomycin      (feeding supplement) PROSource Plus  30 mL Oral BID BM   aspirin  81 mg Oral Daily   Chlorhexidine Gluconate Cloth  6 each Topical Q0600   cinacalcet  90 mg Oral Once per day on Mon Wed Fri   DULoxetine  60 mg Oral Daily   ezetimibe  10 mg Oral Daily   furosemide  80 mg Oral BID   heparin  2,000 Units Dialysis Once in dialysis   heparin  5,000 Units Subcutaneous Q12H   insulin aspart  0-5 Units Subcutaneous QHS   insulin aspart  0-9 Units Subcutaneous TID WC   insulin glargine-yfgn  15 Units Subcutaneous Daily   ketotifen  1 drop Both Eyes BID   metoprolol succinate  50 mg Oral Daily   metroNIDAZOLE  500 mg Oral Q12H   pregabalin  50 mg Oral Daily  sertraline  50 mg Oral Daily   sucroferric oxyhydroxide  1,000 mg Oral TID WC   And   sucroferric oxyhydroxide  500 mg Oral Daily    Dialysis Orders: MWF at [facility] 3:45hr, 400/500, EDW 80.5kg, 2K/2.5Ca, UFP#2, LUE AVF, heparin 1000 unit initial + 1000 unit/hr - Sensipar 90mg  q HD, no VDRA - Mircera IV q 2 weeks - last 09/02/22   Assessment/Plan: 1. L foot TMA dehiscence/infection: On Vanc/Cefepime, s/p debridement/revision 6/14. Intra op wound/bone Cx sent--RARE ENTEROBACTER HORMAECHEI  2. ESRD: Continue MWF schedule - next HD 6/17. 3. HTN/volume: BP controlled. Continue home meds, she is slightly below prior EDW. 4. Anemia of ESRD: Hgb 8.2 - given low dose ESA earlier this week, not due until 6/26. Transfuse prn 5. Secondary hyperparathyroidism: Ca/Phos ok - continue  [name] as binder, sensipar. 6. Nutrition:  Alb low, continue protein supp. 7. T2DM: Insulin per primary.  Bufford Buttner MD 09/07/2022, 9:36 AM  [facility]

## 2022-09-07 NOTE — Plan of Care (Signed)
  Problem: Metabolic: Goal: Ability to maintain appropriate glucose levels will improve Outcome: Progressing   Problem: Clinical Measurements: Goal: Will remain free from infection Outcome: Progressing   Problem: Activity: Goal: Risk for activity intolerance will decrease Outcome: Progressing   Problem: Pain Managment: Goal: General experience of comfort will improve Outcome: Progressing   Problem: Safety: Goal: Ability to remain free from injury will improve Outcome: Progressing   Problem: Skin Integrity: Goal: Risk for impaired skin integrity will decrease Outcome: Progressing

## 2022-09-07 NOTE — Procedures (Signed)
Patient seen and examined on Hemodialysis. The procedure was supervised and I have made appropriate changes. BP (!) 154/58   Pulse 70   Temp 97.7 F (36.5 C)   Resp 16   Ht 5\' 4"  (1.626 m)   Wt 81.2 kg   SpO2 100%   BMI 30.73 kg/m   QB 400 mL/ min via AVF, UF goal 2L  Tolerating treatment without complaints at this time.   Bufford Buttner MD [facility] [phone] 9:37 AM

## 2022-09-07 NOTE — Progress Notes (Signed)
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  She underwent a left foot TMA and went home with a wound VAC in 1 week was found to the patient developed an infection at the surgical site and she was started on doxycycline twice a day.  She continued to have constant bloody pus coming out of the left surgical wound and has severe neuropathy and does not feel a significant pain associate with a left foot wound.  She had to go see her podiatrist on a routine visit yesterday who evaluated and sent her to the ED for IV antibiotics and possible further surgical intervention given that her left foot continue to bleed and because the wound appeared to dehisce.    She was initiated on IV vancomycin, cefepime and Flagyl and podiatry was consulted and took the patient for a left foot transmetatarsal amputation revision with irrigation and debridement and infectious diseases consulted for further evaluation. She is POD 3 and complaining of Pain. Next HD is 09/07/22. Currently awaiting Cx and showing ENTEROBACTER HORMAECHEI.  Assessment and Plan:  Left foot recurrent Osteomyelitis and deep tissue infection, failed outpatient management s/p Left Transmetatarsal Amputation Revision and I and D -C/w with IV vancomycin cefepime and Flagyl -DG Foot done and showed "Interval amputation of the first through  fifth digits to the proximal metatarsal shafts. There is bandage material overlying the distal soft tissues with postsurgical skin staples. The distal surgical wound appears to extend to the remaining distal bone. The surgical bone resection margins appear fairly sharp, and no definitive radiographic evidence of osteomyelitis is seen. Recommend clinical correlation for evidence of infection contacting bone." -Discussed with on-call podiatrist PA, who ordered MRI -MRI done and showed "Postsurgical changes from recent transmetatarsal amputation. Nonspecific marrow edema in each of the remaining metatarsals adjacent to the amputation margins without gross cortical destruction. This could be reactive or secondary to early osteomyelitis. Nonspecific ill-defined fluid adjacent to the metatarsal stumps, greatest medially. No well-defined fluid collections are identified. Nonspecific diffuse muscular edema which may relate to diabetes." -WBC Trend, Lactic Acid and Inflammatory Marker Trend: Recent Labs  Lab 08/18/22 0052 08/19/22 0637 09/03/22 1216 09/03/22 1237 09/03/22 1400 09/03/22 1645 09/04/22 0125 09/05/22 0243 09/06/22 0101 09/07/22 0120  WBC 11.6* 13.8*  --  9.5  --   --  9.3 16.9* 14.4* 13.5*  LATICACIDVEN  --   --    < >  --  1.5  --   --   --   --   --   CRP  --   --   --   --   --  0.9  --   --   --   --    < > = values in this interval not displayed.  -ESR was 70 -Likelihood of losing left foot is high and the Admitter briefly discussed with patient family. -Podiatry consulted the patient for a left revision of the transmetatarsal amputation with  irrigation and debridement and she is POD 3 and Podiatry recommending CAM boot and NWB to Lower Extremity and following C/x and recommeding to avoid any compression dressing on the Left foot at SNF -ID consulted for further evaluation and recommendations and recommended continuing IV Vancomycin and IV Cefepime per HD and oral Metronidazole 500  mg po BID and following up on Cx results to either decide continuing Abx with HD or Oral Abx and recommending continuing IV Abx if there are no Clear Margins; Awaiting ID recc's -Current Cx showing: Gram Stain RARE WBC PRESENT, PREDOMINANTLY PMN NO ORGANISMS SEEN Performed at [facility], [address], [place], [place] [address]  Culture RARE ENTEROBACTER HORMAECHEI NO ANAEROBES ISOLATED; CULTURE IN PROGRESS FOR 5 DAYS  Report Status PENDING  Organism ID, Bacteria ENTEROBACTER HORMAECHEI  Resulting Agency [facility]     Susceptibility   Enterobacter hormaechei    MIC    CEFEPIME 4 INTERMEDI... Intermediate    CEFTAZIDIME >=64 RESIST... Resistant    CIPROFLOXACIN <=0.25 SENS... Sensitive    GENTAMICIN <=1 SENSITIVE Sensitive    IMIPENEM <=0.25 SENS... Sensitive    PIP/TAZO >=128 RESIS... Resistant    TRIMETH/SULFA <=20 SENSIT... Sensitive         -PT/OT recommending SNF when Stable for D/C   PVD with HX of Right BKA -Underwent extensive treatment by vascular surgery including angiogram, intra-arterial lithotripsy and stenting recently -Continue Aspirin 81 mg po Daily; Plavix and not started for some reason and do not appear to be on her MAR now -Continue Ezetimibe 10 mg po Daily   -Will need to confirm Home Regimen with Pharmacy but appears to have a Statin Allergy so was not initiated in May due to allergy   IDDM with Insulin Resistance and Neuropathy -Most recent A1c 6.3 -C/w Pregablain 50 mg po Daily -Given her history of ESRD, will cut down long-acting insulin dosage and is now on Semglee 15 units sq Daily but will increase to 20 units Every Day and Adding Novolog 3 Units TID -C/w SSI Sensitive Novolog AC/HS -CBG Trend: Recent Labs  Lab 09/05/22 1952 09/06/22 0734 09/06/22 1124 09/06/22 1708 09/06/22 1929 09/07/22 0809 09/07/22 1319  GLUCAP 296* 172* 308* 320* 303* 163* 148*    HTN -Continue Metoprolol Succinate 50 mg po Daily -Continue to Monitor BP  per Protocol -Last BP reading was 146//57  HLD -C/w Ezetimibe 10 mg po Daily  ESRD on HD MWF -BUN/Cr Trend: Recent Labs  Lab 08/19/22 0637 09/03/22 1237 09/04/22 0127 09/04/22 1345 09/05/22 0243 09/06/22 0101 09/07/22 0120  BUN 38* 36* 53* 26* 42* 63* 85*  CREATININE 7.11* 5.13* 5.95* 3.40* 4.87* 6.57* 8.17*  -Nephrology consulted for further evaluation and patient was dialyzed on 09/04/22 and on 09/07/22 -C/w Cinacalcet 90 mg po TIDwm and Furosemide 80 mg po BID as well as Sucroferric Oxyhydroxide 1000 mg po TIDwm and 500 mg po Custom -Avoid Nephrotoxic Medications, Contrast Dyes, Hypotension and Dehydration to Ensure Adequate Renal Perfusion and will need to Renally Adjust Meds -Continue to Monitor and Trend Renal Function carefully and repeat CMP in the AM   Microcytic Anemia/Anemia of Chronic Disease -Hgb/Hct Trend: Recent Labs  Lab 08/19/22 0637 09/03/22 1237 09/04/22 0125 09/04/22 1345 09/05/22 0243 09/06/22 0101 09/07/22 0120  HGB 7.9* 8.9* 8.1* 9.9* 8.1* 8.2* 7.5*  HCT 26.0* 29.8* 26.5* 29.0* 26.8* 27.1* 24.5*  MCV 80.7 82.5 81.3  --  80.5 79.2* 79.8*  -Checked Anemia Panel and it showed an Iron level of  19, UIBC of 162, TIBC of 181, Saturation Ratios of 11%, Ferritin 885, Folate Level of 9.5, and Vitamin B12 Level of 572 -Continue to Monitor for S/Sx of Bleeding; no overt bleeding noted -Repeat CBC in the AM  Depression and Anxiety -C/w Sertraline 50 mg po Daily  Hypoalbuminemia -Patient's Albumin Trend: Recent Labs  Lab 08/17/22 0737 08/19/22 0637 09/03/22 1237 09/04/22 0125 09/05/22 0243 09/06/22 0101 09/07/22 0120  ALBUMIN 2.9* 2.6* 3.4* 2.9* 2.9* 2.7* 2.5*  -Continue to Monitor and Trend and repeat CMP in the AM  Obesity -Complicates overall prognosis and care -Estimated body mass index is 30.16 kg/m as calculated from the following:   Height as of this encounter: 5\' 4"  (1.626 m).   Weight as of this encounter: 79.7 kg.  -Weight Loss and  Dietary Counseling given   DVT prophylaxis: heparin injection 5,000 Units Start: 09/03/22 1530    Code Status: Full Code Family Communication: No family currently at bedside  Disposition Plan:  Level of care: Med-Surg Status is: Inpatient Remains inpatient appropriate because: Needs further antibiotic management and clearance by specialist and anticipating discharging back to SNF in the next 24 to 48 hours given that she needs insurance Auth and bed availability   Consultants:  Infectious Diseases Nephrology Podiatry  Procedures:  1. Left revision transmetatarsal amputation with irrigation and debridement   Antimicrobials:  Anti-infectives (From admission, onward)    Start     Dose/Rate Route Frequency Ordered Stop   09/07/22 2200  doxycycline (VIBRA-TABS) tablet 100 mg        100 mg Oral Every 12 hours 09/07/22 1518     09/04/22 1200  vancomycin (VANCOREADY) IVPB 750 mg/150 mL  Status:  Discontinued        750 mg 150 mL/hr over 60 Minutes Intravenous Every M-W-F (Hemodialysis) 09/03/22 1415 09/07/22 1518   09/03/22 2200  metroNIDAZOLE (FLAGYL) tablet 500 mg  Status:  Discontinued        500 mg Oral Every 12 hours 09/03/22 1520 09/03/22 1520   09/03/22 2000  ceFEPIme (MAXIPIME) 1 g in sodium chloride 0.9 % 100 mL IVPB        1 g 200 mL/hr over 30 Minutes Intravenous Every 24 hours 09/03/22 1415     09/03/22 1315  vancomycin (VANCOREADY) IVPB 2000 mg/400 mL        2,000 mg 200 mL/hr over 120 Minutes Intravenous  Once 09/03/22 1304 09/03/22 2222   09/03/22 1315  ceFEPIme (MAXIPIME) 2 g in sodium chloride 0.9 % 100 mL IVPB        2 g 200 mL/hr over 30 Minutes Intravenous  Once 09/03/22 1304 09/03/22 1817   09/03/22 1300  metroNIDAZOLE (FLAGYL) tablet 500 mg        500 mg Oral Every 12 hours 09/03/22 1251 09/10/22 0959       Subjective: Seen and examined at bedside in the dialysis unit thinks that her pain in her feet is doing okay.  No nausea or vomiting.  Denies any chest  pain shortness of breath.  No other concerns or complaints and she is resting.  Objective: Vitals:   09/07/22 1232 09/07/22 1233 09/07/22 1234 09/07/22 1325  BP: (!) 123/53 (!) 123/53 (!) 153/73 (!) 146/57  Pulse: 64 62 63 61  Resp: 11 12 10    Temp: 97.8 F (36.6 C)   98.1 F (36.7 C)  TempSrc:    Oral  SpO2: 100% 100% 100% 100%  Weight:  79.7 kg    Height:  Intake/Output Summary (Last 24 hours) at 09/07/2022 1554 Last data filed at 09/07/2022 1234 Gross per 24 hour  Intake 480 ml  Output 1500 ml  Net -1020 ml   Filed Weights   09/04/22 1155 09/07/22 0829 09/07/22 1233  Weight: 79.4 kg 81.2 kg 79.7 kg   Examination: Physical Exam:  Constitutional: WN/WD obese African-American female in no acute distress Respiratory: Diminished to auscultation bilaterally, no wheezing, rales, rhonchi or crackles. Normal respiratory effort and patient is not tachypenic. No accessory muscle use.  Unlabored breathing Cardiovascular: RRR, no murmurs / rubs / gallops. S1 and S2 auscultated. No extremity edema.  Abdomen: Soft, non-tender, distended secondary to body habitus. Bowel sounds positive.  GU: Deferred. Musculoskeletal: No clubbing / cyanosis of digits/nails.  Has a right BKA and a left TMA that is wrapped; also has a left arm AV fistula that she is getting dialysis through Skin: No rashes, lesions, ulcers on a limited skin evaluation. No induration; Warm and dry.  Neurologic: CN 2-12 grossly intact with no focal deficits. Romberg sign and cerebellar reflexes not assessed.  Psychiatric: Normal judgment and insight. Alert and oriented x 3. Normal mood and appropriate affect.   Data Reviewed: I have personally reviewed following labs and imaging studies  CBC: Recent Labs  Lab 09/03/22 1237 09/04/22 0125 09/04/22 1345 09/05/22 0243 09/06/22 0101 09/07/22 0120  WBC 9.5 9.3  --  16.9* 14.4* 13.5*  NEUTROABS 6.8 6.5  --  14.5* 11.3* 9.9*  HGB 8.9* 8.1* 9.9* 8.1* 8.2* 7.5*  HCT  29.8* 26.5* 29.0* 26.8* 27.1* 24.5*  MCV 82.5 81.3  --  80.5 79.2* 79.8*  PLT 354 350  --  347 315 311   Basic Metabolic Panel: Recent Labs  Lab 09/03/22 1237 09/04/22 0125 09/04/22 0127 09/04/22 1345 09/05/22 0243 09/06/22 0101 09/07/22 0120  NA 140  --  140 139 138 136 138  K 3.7  --  3.5 3.5 3.6 3.6 4.2  CL 101  --  102 97* 98 97* 98  CO2 24  --  24  --  26 25 25   GLUCOSE 164*  --  246* 95 240* 251* 199*  BUN 36*  --  53* 26* 42* 63* 85*  CREATININE 5.13*  --  5.95* 3.40* 4.87* 6.57* 8.17*  CALCIUM 8.6*  --  8.6*  --  8.8* 9.1 9.3  MG  --  2.1  --   --  1.8 1.9 2.3  PHOS  --  5.1*  --   --  3.7 4.5 4.6   GFR: Estimated Creatinine Clearance: 6.9 mL/min (A) (by C-G formula based on SCr of 8.17 mg/dL (H)). Liver Function Tests: Recent Labs  Lab 09/03/22 1237 09/04/22 0125 09/05/22 0243 09/06/22 0101 09/07/22 0120  AST 17 16 16  12* 11*  ALT 24 22 23 20 17   ALKPHOS 96 96 96 89 89  BILITOT 0.4 0.7 0.7 0.7 0.7  PROT 7.6 6.7 6.8 6.9 6.6  ALBUMIN 3.4* 2.9* 2.9* 2.7* 2.5*   No results for input(s): "LIPASE", "AMYLASE" in the last 168 hours. No results for input(s): "AMMONIA" in the last 168 hours. Coagulation Profile: No results for input(s): "INR", "PROTIME" in the last 168 hours. Cardiac Enzymes: No results for input(s): "CKTOTAL", "CKMB", "CKMBINDEX", "TROPONINI" in the last 168 hours. BNP (last 3 results) No results for input(s): "PROBNP" in the last 8760 hours. HbA1C: No results for input(s): "HGBA1C" in the last 72 hours. CBG: Recent Labs  Lab 09/06/22 1124 09/06/22 1708 09/06/22 1929 09/07/22  0809 09/07/22 1319  GLUCAP 308* 320* 303* 163* 148*   Lipid Profile: No results for input(s): "CHOL", "HDL", "LDLCALC", "TRIG", "CHOLHDL", "LDLDIRECT" in the last 72 hours. Thyroid Function Tests: No results for input(s): "TSH", "T4TOTAL", "FREET4", "T3FREE", "THYROIDAB" in the last 72 hours. Anemia Panel: Recent Labs    09/05/22 0243  VITAMINB12 572  FOLATE  9.5  FERRITIN 885*  TIBC 181*  IRON 19*  RETICCTPCT 3.0   Sepsis Labs: Recent Labs  Lab 09/03/22 1216 09/03/22 1400  LATICACIDVEN 1.8 1.5   Recent Results (from the past 240 hour(s))  WOUND CULTURE     Status: Abnormal   Collection Time: 09/03/22 12:06 PM   Specimen: Wound  Result Value Ref Range Status   MICRO NUMBER: [id number]  Final   SPECIMEN QUALITY: Adequate  Final   SOURCE: WOUND (SITE NOT SPECIFIED)  Final   STATUS: FINAL  Final   GRAM STAIN:   Final    No white blood cells seen No epithelial cells seen Many Gram negative bacilli   ISOLATE 1: Enterobacter cloacae complex (A)  Final    Comment: Heavy growth of Enterobacter cloacae complex      Susceptibility   Enterobacter cloacae complex - AEROBIC CULT, GRAM STAIN NEGATIVE 1    AMOX/CLAVULANIC >=32 Resistant     CEFAZOLIN* >=64 Resistant      * For infections other than uncomplicated UTI caused by E. coli, K. pneumoniae or P. mirabilis: Cefazolin is resistant if MIC > or = 8 mcg/mL. (Distinguishing susceptible versus intermediate for isolates with MIC < or = 4 mcg/mL requires additional testing.)     CEFTAZIDIME >=64 Resistant     CEFEPIME 4 Intermediate     CEFTRIAXONE >=64 Resistant     CIPROFLOXACIN <=0.25 Sensitive     LEVOFLOXACIN <=0.12 Sensitive     GENTAMICIN <=1 Sensitive     IMIPENEM <=0.25 Sensitive     PIP/TAZO >=128 Resistant     TOBRAMYCIN <=1 Sensitive     TRIMETH/SULFA* <=20 Sensitive      * For infections other than uncomplicated UTI caused by E. coli, K. pneumoniae or P. mirabilis: Cefazolin is resistant if MIC > or = 8 mcg/mL. (Distinguishing susceptible versus intermediate for isolates with MIC < or = 4 mcg/mL requires additional testing.) Legend: S = Susceptible  I = Intermediate R = Resistant  NS = Not susceptible * = Not tested  NR = Not reported **NN = See antimicrobic comments   Blood Cultures x 2 sites     Status: None (Preliminary result)   Collection Time: 09/03/22  1:30  PM   Specimen: BLOOD  Result Value Ref Range Status   Specimen Description BLOOD SITE NOT SPECIFIED  Final   Special Requests   Final    BOTTLES DRAWN AEROBIC AND ANAEROBIC Blood Culture results may not be optimal due to an inadequate volume of blood received in culture bottles   Culture   Final    NO GROWTH 4 DAYS Performed at [facility], [address], [place], [place] [address]    Report Status PENDING  Incomplete  Blood Cultures x 2 sites     Status: None (Preliminary result)   Collection Time: 09/03/22  1:40 PM   Specimen: BLOOD  Result Value Ref Range Status   Specimen Description BLOOD SITE NOT SPECIFIED  Final   Special Requests   Final    BOTTLES DRAWN AEROBIC AND ANAEROBIC Blood Culture adequate volume   Culture   Final  NO GROWTH 4 DAYS Performed at [facility], [address], [place], [place] [address]    Report Status PENDING  Incomplete  Surgical pcr screen     Status: None   Collection Time: 09/04/22  5:01 AM   Specimen: Nasal Mucosa; Nasal Swab  Result Value Ref Range Status   MRSA, PCR NEGATIVE NEGATIVE Final   Staphylococcus aureus NEGATIVE NEGATIVE Final    Comment: (NOTE) The Xpert SA Assay (FDA approved for NASAL specimens in patients 61 years of age and older), is one component of a comprehensive surveillance program. It is not intended to diagnose infection nor to guide or monitor treatment. Performed at [facility], [address], [place], [place] [address]   Aerobic/Anaerobic Culture w Gram Stain (surgical/deep wound)     Status: None (Preliminary result)   Collection Time: 09/04/22  2:56 PM   Specimen: PATH Digit amputation; Tissue  Result Value Ref Range Status   Specimen Description WOUND  Final   Special Requests RESIDUAL FIRST METATARSAL  Final   Gram Stain   Final    RARE WBC PRESENT, PREDOMINANTLY PMN NO ORGANISMS SEEN Performed at [facility], [address], [place], [place] [address]    Culture    Final    RARE ENTEROBACTER HORMAECHEI NO ANAEROBES ISOLATED; CULTURE IN PROGRESS FOR 5 DAYS    Report Status PENDING  Incomplete   Organism ID, Bacteria ENTEROBACTER HORMAECHEI  Final      Susceptibility   Enterobacter hormaechei - MIC*    CEFEPIME 4 INTERMEDIATE Intermediate     CEFTAZIDIME >=64 RESISTANT Resistant     CIPROFLOXACIN <=0.25 SENSITIVE Sensitive     GENTAMICIN <=1 SENSITIVE Sensitive     IMIPENEM <=0.25 SENSITIVE Sensitive     TRIMETH/SULFA <=20 SENSITIVE Sensitive     PIP/TAZO >=128 RESISTANT Resistant     * RARE ENTEROBACTER HORMAECHEI    Radiology Studies: No results found.  Scheduled Meds:  (feeding supplement) PROSource Plus  30 mL Oral BID BM   aspirin  81 mg Oral Daily   Chlorhexidine Gluconate Cloth  6 each Topical Q0600   cinacalcet  90 mg Oral Once per day on Mon Wed Fri   doxycycline  100 mg Oral Q12H   DULoxetine  60 mg Oral Daily   ezetimibe  10 mg Oral Daily   furosemide  80 mg Oral BID   heparin  5,000 Units Subcutaneous Q12H   insulin aspart  0-5 Units Subcutaneous QHS   insulin aspart  0-9 Units Subcutaneous TID WC   insulin aspart  3 Units Subcutaneous TID WC   [START ON 09/08/2022] insulin glargine-yfgn  20 Units Subcutaneous Daily   ketotifen  1 drop Both Eyes BID   metoprolol succinate  50 mg Oral Daily   metroNIDAZOLE  500 mg Oral Q12H   pregabalin  50 mg Oral Daily   sertraline  50 mg Oral Daily   sucroferric oxyhydroxide  1,000 mg Oral TID WC   And   sucroferric oxyhydroxide  500 mg Oral Daily   Continuous Infusions:  ceFEPime (MAXIPIME) IV 1 g (09/06/22 2011)    LOS: 4 days   Marguerita Merles, DO [facility] Available via Epic secure chat 7am-7pm After these hours, please refer to coverage provider listed on [email or web address] 09/07/2022, 3:54 PM

## 2022-09-07 NOTE — Progress Notes (Signed)
  Subjective:  Patient ID: Kathryn Beck, female    DOB: 1955/11/01,  MRN: [id number]  Patient POD#3 s/p left transmetatarsal amputation revision and irrigation and debridement.  Patient has been doing well.  No fevers or chills.  She is sitting up in a chair eating dinner CBC    Component Value Date/Time   WBC 13.5 (H) 09/07/2022 0120   RBC 3.07 (L) 09/07/2022 0120   HGB 7.5 (L) 09/07/2022 0120   HGB 9.9 (L) 07/07/2022 1140   HCT 24.5 (L) 09/07/2022 0120   HCT 33.2 (L) 07/07/2022 1140   PLT 311 09/07/2022 0120   PLT 270 07/07/2022 1140   MCV 79.8 (L) 09/07/2022 0120   MCV 81 07/07/2022 1140   MCH 24.4 (L) 09/07/2022 0120   MCHC 30.6 09/07/2022 0120   RDW 18.7 (H) 09/07/2022 0120   RDW 15.9 (H) 07/07/2022 1140   LYMPHSABS 1.9 09/07/2022 0120   LYMPHSABS 2.0 07/07/2022 1140   MONOABS 1.3 (H) 09/07/2022 0120   EOSABS 0.2 09/07/2022 0120   EOSABS 0.1 07/07/2022 1140   BASOSABS 0.1 09/07/2022 0120   BASOSABS 0.1 07/07/2022 1140      Latest Ref Rng & Units 09/07/2022    1:20 AM 09/06/2022    1:01 AM 09/05/2022    2:43 AM  BMP  Glucose 70 - 99 mg/dL 409  811  914   BUN 8 - 23 mg/dL 85  63  42   Creatinine 0.44 - 1.00 mg/dL 7.82  9.56  2.13   Sodium 135 - 145 mmol/L 138  136  138   Potassium 3.5 - 5.1 mmol/L 4.2  3.6  3.6   Chloride 98 - 111 mmol/L 98  97  98   CO2 22 - 32 mmol/L 25  25  26    Calcium 8.9 - 10.3 mg/dL 9.3  9.1  8.8       Objective:   Vitals:   09/07/22 1325 09/07/22 1941  BP: (!) 146/57 (!) 143/59  Pulse: 61 62  Resp:  17  Temp: 98.1 F (36.7 C) 99 F (37.2 C)  SpO2: 100% 97%     General:AA&O x 3. Normal mood and affect  Dressing clean, dry , intact.  No signficant pain on exam.  Calf is supple    Assessment & Plan:  Patient was evaluated and treated and all questions answered.  DX: POD#3 s/p left transmetatarsal amputation revision   -Antibiotics: Appreciate ID reccs. Will follow cultures.  -Culture residual 1st metatarsal 6/14- rare  enterobacter hormaechei -WBC trending down, will monitor  -DME: CAM boot and NWB to lower extremity.   -Pending cultures patient will be ok for discharge and return to SNF. Advise to avoid any compression dressing on this foot while at SNF as this is likley what caused incision to open.  -Podiatry will follow-up one week after discharge. Dressing to be clean dry and intact until then per Dr. Georgia Dom, DPM

## 2022-09-07 NOTE — Progress Notes (Signed)
OT Cancellation Note  Patient Details Name: Kathryn Beck MRN: [id number] DOB: 1955/10/21   Cancelled Treatment:    Reason Eval/Treat Not Completed: Patient at procedure or test/ unavailable (HD. Will follow up at a later time)  [facility] 09/07/2022, 9:11 AM Luisa Dago, OT/L   Acute OT Clinical Specialist Acute Rehabilitation Services Pager [phone] Office [phone]

## 2022-09-07 NOTE — Progress Notes (Signed)
PT Cancellation Note  Patient Details Name: SERESA MOISE MRN: [id number] DOB: 20-Apr-1955   Cancelled Treatment:    Reason Eval/Treat Not Completed: Patient at procedure or test/unavailable (pt off unit at HD. Will follow up at later date/time as pt able and schedule allows.)    Renaldo Fiddler PT, DPT Acute Rehabilitation Services Office [phone]  09/07/22 12:04 PM

## 2022-09-08 ENCOUNTER — Telehealth: Payer: Self-pay | Admitting: Podiatry

## 2022-09-08 DIAGNOSIS — N2581 Secondary hyperparathyroidism of renal origin: Secondary | ICD-10-CM | POA: Diagnosis not present

## 2022-09-08 DIAGNOSIS — Z89422 Acquired absence of other left toe(s): Secondary | ICD-10-CM | POA: Diagnosis not present

## 2022-09-08 DIAGNOSIS — Z89432 Acquired absence of left foot: Secondary | ICD-10-CM | POA: Diagnosis not present

## 2022-09-08 DIAGNOSIS — I739 Peripheral vascular disease, unspecified: Secondary | ICD-10-CM | POA: Diagnosis not present

## 2022-09-08 DIAGNOSIS — M86672 Other chronic osteomyelitis, left ankle and foot: Secondary | ICD-10-CM | POA: Diagnosis not present

## 2022-09-08 DIAGNOSIS — N250 Renal osteodystrophy: Secondary | ICD-10-CM | POA: Diagnosis not present

## 2022-09-08 DIAGNOSIS — T8781 Dehiscence of amputation stump: Secondary | ICD-10-CM | POA: Diagnosis not present

## 2022-09-08 DIAGNOSIS — G894 Chronic pain syndrome: Secondary | ICD-10-CM | POA: Diagnosis not present

## 2022-09-08 DIAGNOSIS — Z8673 Personal history of transient ischemic attack (TIA), and cerebral infarction without residual deficits: Secondary | ICD-10-CM | POA: Diagnosis not present

## 2022-09-08 DIAGNOSIS — M62521 Muscle wasting and atrophy, not elsewhere classified, right upper arm: Secondary | ICD-10-CM | POA: Diagnosis not present

## 2022-09-08 DIAGNOSIS — E1122 Type 2 diabetes mellitus with diabetic chronic kidney disease: Secondary | ICD-10-CM | POA: Diagnosis not present

## 2022-09-08 DIAGNOSIS — R531 Weakness: Secondary | ICD-10-CM | POA: Diagnosis not present

## 2022-09-08 DIAGNOSIS — I5032 Chronic diastolic (congestive) heart failure: Secondary | ICD-10-CM | POA: Diagnosis not present

## 2022-09-08 DIAGNOSIS — Z992 Dependence on renal dialysis: Secondary | ICD-10-CM | POA: Diagnosis not present

## 2022-09-08 DIAGNOSIS — R2689 Other abnormalities of gait and mobility: Secondary | ICD-10-CM | POA: Diagnosis not present

## 2022-09-08 DIAGNOSIS — Z7401 Bed confinement status: Secondary | ICD-10-CM | POA: Diagnosis not present

## 2022-09-08 DIAGNOSIS — Y838 Other surgical procedures as the cause of abnormal reaction of the patient, or of later complication, without mention of misadventure at the time of the procedure: Secondary | ICD-10-CM | POA: Diagnosis not present

## 2022-09-08 DIAGNOSIS — Z743 Need for continuous supervision: Secondary | ICD-10-CM | POA: Diagnosis not present

## 2022-09-08 DIAGNOSIS — R296 Repeated falls: Secondary | ICD-10-CM | POA: Diagnosis not present

## 2022-09-08 DIAGNOSIS — D649 Anemia, unspecified: Secondary | ICD-10-CM | POA: Diagnosis not present

## 2022-09-08 DIAGNOSIS — E1169 Type 2 diabetes mellitus with other specified complication: Secondary | ICD-10-CM | POA: Diagnosis not present

## 2022-09-08 DIAGNOSIS — M868X7 Other osteomyelitis, ankle and foot: Secondary | ICD-10-CM | POA: Diagnosis not present

## 2022-09-08 DIAGNOSIS — Z4781 Encounter for orthopedic aftercare following surgical amputation: Secondary | ICD-10-CM | POA: Diagnosis not present

## 2022-09-08 DIAGNOSIS — Z89511 Acquired absence of right leg below knee: Secondary | ICD-10-CM | POA: Diagnosis not present

## 2022-09-08 DIAGNOSIS — Z794 Long term (current) use of insulin: Secondary | ICD-10-CM | POA: Diagnosis not present

## 2022-09-08 DIAGNOSIS — L089 Local infection of the skin and subcutaneous tissue, unspecified: Secondary | ICD-10-CM | POA: Diagnosis not present

## 2022-09-08 DIAGNOSIS — M86172 Other acute osteomyelitis, left ankle and foot: Secondary | ICD-10-CM | POA: Diagnosis not present

## 2022-09-08 DIAGNOSIS — I10 Essential (primary) hypertension: Secondary | ICD-10-CM | POA: Diagnosis not present

## 2022-09-08 DIAGNOSIS — Z7982 Long term (current) use of aspirin: Secondary | ICD-10-CM | POA: Diagnosis not present

## 2022-09-08 DIAGNOSIS — T8131XA Disruption of external operation (surgical) wound, not elsewhere classified, initial encounter: Secondary | ICD-10-CM | POA: Diagnosis not present

## 2022-09-08 DIAGNOSIS — Z79899 Other long term (current) drug therapy: Secondary | ICD-10-CM | POA: Diagnosis not present

## 2022-09-08 DIAGNOSIS — G629 Polyneuropathy, unspecified: Secondary | ICD-10-CM | POA: Insufficient documentation

## 2022-09-08 DIAGNOSIS — D631 Anemia in chronic kidney disease: Secondary | ICD-10-CM | POA: Diagnosis not present

## 2022-09-08 DIAGNOSIS — Z48817 Encounter for surgical aftercare following surgery on the skin and subcutaneous tissue: Secondary | ICD-10-CM | POA: Diagnosis not present

## 2022-09-08 DIAGNOSIS — Z7984 Long term (current) use of oral hypoglycemic drugs: Secondary | ICD-10-CM | POA: Diagnosis not present

## 2022-09-08 DIAGNOSIS — I120 Hypertensive chronic kidney disease with stage 5 chronic kidney disease or end stage renal disease: Secondary | ICD-10-CM | POA: Diagnosis not present

## 2022-09-08 DIAGNOSIS — D638 Anemia in other chronic diseases classified elsewhere: Secondary | ICD-10-CM | POA: Diagnosis not present

## 2022-09-08 DIAGNOSIS — R41841 Cognitive communication deficit: Secondary | ICD-10-CM | POA: Diagnosis not present

## 2022-09-08 DIAGNOSIS — T8149XD Infection following a procedure, other surgical site, subsequent encounter: Secondary | ICD-10-CM | POA: Diagnosis not present

## 2022-09-08 DIAGNOSIS — N186 End stage renal disease: Secondary | ICD-10-CM | POA: Diagnosis not present

## 2022-09-08 DIAGNOSIS — Z8616 Personal history of COVID-19: Secondary | ICD-10-CM | POA: Diagnosis not present

## 2022-09-08 DIAGNOSIS — M62562 Muscle wasting and atrophy, not elsewhere classified, left lower leg: Secondary | ICD-10-CM | POA: Diagnosis not present

## 2022-09-08 DIAGNOSIS — T8149XA Infection following a procedure, other surgical site, initial encounter: Secondary | ICD-10-CM | POA: Diagnosis not present

## 2022-09-08 DIAGNOSIS — M62522 Muscle wasting and atrophy, not elsewhere classified, left upper arm: Secondary | ICD-10-CM | POA: Diagnosis not present

## 2022-09-08 DIAGNOSIS — N184 Chronic kidney disease, stage 4 (severe): Secondary | ICD-10-CM | POA: Diagnosis not present

## 2022-09-08 DIAGNOSIS — M869 Osteomyelitis, unspecified: Secondary | ICD-10-CM | POA: Diagnosis not present

## 2022-09-08 DIAGNOSIS — M6281 Muscle weakness (generalized): Secondary | ICD-10-CM | POA: Diagnosis not present

## 2022-09-08 DIAGNOSIS — E1140 Type 2 diabetes mellitus with diabetic neuropathy, unspecified: Secondary | ICD-10-CM | POA: Diagnosis not present

## 2022-09-08 LAB — MAGNESIUM: Magnesium: 1.9 mg/dL (ref 1.7–2.4)

## 2022-09-08 LAB — CBC WITH DIFFERENTIAL/PLATELET
Abs Immature Granulocytes: 0.07 10*3/uL (ref 0.00–0.07)
Basophils Absolute: 0.1 10*3/uL (ref 0.0–0.1)
Basophils Relative: 1 %
Eosinophils Absolute: 0.1 10*3/uL (ref 0.0–0.5)
Eosinophils Relative: 1 %
HCT: 27.1 % — ABNORMAL LOW (ref 36.0–46.0)
Hemoglobin: 8.3 g/dL — ABNORMAL LOW (ref 12.0–15.0)
Immature Granulocytes: 1 %
Lymphocytes Relative: 14 %
Lymphs Abs: 1.4 10*3/uL (ref 0.7–4.0)
MCH: 24.8 pg — ABNORMAL LOW (ref 26.0–34.0)
MCHC: 30.6 g/dL (ref 30.0–36.0)
MCV: 80.9 fL (ref 80.0–100.0)
Monocytes Absolute: 0.7 10*3/uL (ref 0.1–1.0)
Monocytes Relative: 7 %
Neutro Abs: 8.2 10*3/uL — ABNORMAL HIGH (ref 1.7–7.7)
Neutrophils Relative %: 76 %
Platelets: 336 10*3/uL (ref 150–400)
RBC: 3.35 MIL/uL — ABNORMAL LOW (ref 3.87–5.11)
RDW: 19 % — ABNORMAL HIGH (ref 11.5–15.5)
WBC: 10.6 10*3/uL — ABNORMAL HIGH (ref 4.0–10.5)
nRBC: 0.2 % (ref 0.0–0.2)

## 2022-09-08 LAB — COMPREHENSIVE METABOLIC PANEL
ALT: 16 U/L (ref 0–44)
AST: 13 U/L — ABNORMAL LOW (ref 15–41)
Albumin: 2.7 g/dL — ABNORMAL LOW (ref 3.5–5.0)
Alkaline Phosphatase: 85 U/L (ref 38–126)
Anion gap: 15 (ref 5–15)
BUN: 53 mg/dL — ABNORMAL HIGH (ref 8–23)
CO2: 24 mmol/L (ref 22–32)
Calcium: 9 mg/dL (ref 8.9–10.3)
Chloride: 98 mmol/L (ref 98–111)
Creatinine, Ser: 5.49 mg/dL — ABNORMAL HIGH (ref 0.44–1.00)
GFR, Estimated: 8 mL/min — ABNORMAL LOW (ref >60)
Glucose, Bld: 260 mg/dL — ABNORMAL HIGH (ref 70–99)
Potassium: 4.4 mmol/L (ref 3.5–5.1)
Sodium: 137 mmol/L (ref 135–145)
Total Bilirubin: 0.5 mg/dL (ref 0.3–1.2)
Total Protein: 6.7 g/dL (ref 6.5–8.1)

## 2022-09-08 LAB — HEPATITIS B SURFACE ANTIBODY, QUANTITATIVE: Hep B S AB Quant (Post): 993 m[IU]/mL (ref >9.9)

## 2022-09-08 LAB — PHOSPHORUS: Phosphorus: 4.6 mg/dL (ref 2.5–4.6)

## 2022-09-08 LAB — CULTURE, BLOOD (ROUTINE X 2)
Culture: NO GROWTH
Special Requests: ADEQUATE

## 2022-09-08 LAB — GLUCOSE, CAPILLARY
Glucose-Capillary: 260 mg/dL — ABNORMAL HIGH (ref 70–99)
Glucose-Capillary: 186 mg/dL — ABNORMAL HIGH (ref 70–99)

## 2022-09-08 LAB — SURGICAL PATHOLOGY

## 2022-09-08 MED ORDER — DOXYCYCLINE HYCLATE 100 MG PO CAPS: 100.0000 mg | ORAL_CAPSULE | Freq: Two times a day (BID) | ORAL | 0 refills | Status: AC

## 2022-09-08 MED ORDER — CIPROFLOXACIN HCL 500 MG PO TABS: 500.0000 mg | ORAL_TABLET | Freq: Every day | ORAL | 0 refills | Status: AC

## 2022-09-08 MED ORDER — CIPROFLOXACIN HCL 500 MG PO TABS: 500.0000 mg | ORAL_TABLET | Freq: Every day | ORAL | Status: DC

## 2022-09-08 MED ORDER — OXYCODONE-ACETAMINOPHEN 5-325 MG PO TABS: 1.0000 | ORAL_TABLET | ORAL | 0 refills | Status: DC | PRN

## 2022-09-08 MED ORDER — CINACALCET HCL 30 MG PO TABS: 90.0000 mg | ORAL_TABLET | ORAL | Status: DC

## 2022-09-08 MED ORDER — PROSOURCE PLUS PO LIQD: 30.0000 mL | Freq: Two times a day (BID) | ORAL | 0 refills | Status: DC

## 2022-09-08 NOTE — Progress Notes (Addendum)
[facility]  Date of Admission:  09/03/2022   Total days of inpatient antibiotics 5  Principal Problem:   Osteomyelitis (HCC) Active Problems:   Chronic kidney disease, stage 4 (severe) (HCC)   Anemia of chronic disease   ESRD on hemodialysis (HCC)   Acute osteomyelitis of toe of left foot (HCC)   Left foot infection   Status post transmetatarsal amputation of foot, left (HCC)   Wound infection after surgery          Assessment: 67 year old female with ESRD on HD, PVD status post right BKA history of left foot osteomyelitis status post TMA presented with wound dehiscence. #Left foot osteomyelitis status post TMA requiring revision of TMA on 6/14 - Patient was on doxycycline prior to admission for about a week - Or cultures grew Enterobacter hormaechei form residual metatarsal superficial wound culture grew Enterobacter cloacae.  Both sensitive to ciprofloxacin.  Recommendations: -Discontinue cefepime and metronidazole - Start doxycycline as patient was on it prior to admission.  Start ciprofloxacin to cover Enterobacter.  Anticipate 6 weeks of antibiotics for osteomyelitis EOT 7/25.  Patient is on ciprofloxacin which is QTc noted to be 440.  Needs periodic QTc check on ciprofloxacin.  Follow-up pathology. - Follow-up with infectious disease on 7/23 with Dr. Drue Second - ID will sign off  #Diabetes mellitus A1c 6.3 on 07/07/22 Needs glycemic control for proper wound healing  Microbiology:   Antibiotics: Cefepime 6/13-present Metronidazole 6/13-present Vancomycin 6/13-6/17 Cultures: Blood 6/13 no growth Urine  Other OR cultures 6/14 ENTEROBACTER HORMAECHEI    SUBJECTIVE: Resting in bed.  No new complaints.  Inquiring about discharge. Interval:   Review of Systems: Review of Systems  All other systems reviewed and are negative.    Scheduled Meds:  (feeding supplement) PROSource Plus  30 mL Oral BID BM   aspirin  81 mg Oral  Daily   Chlorhexidine Gluconate Cloth  6 each Topical Q0600   cinacalcet  90 mg Oral Once per day on Mon Wed Fri   ciprofloxacin  500 mg Oral q1800   doxycycline  100 mg Oral Q12H   DULoxetine  60 mg Oral Daily   ezetimibe  10 mg Oral Daily   furosemide  80 mg Oral BID   heparin  5,000 Units Subcutaneous Q12H   insulin aspart  0-5 Units Subcutaneous QHS   insulin aspart  0-9 Units Subcutaneous TID WC   insulin aspart  3 Units Subcutaneous TID WC   insulin glargine-yfgn  20 Units Subcutaneous Daily   ketotifen  1 drop Both Eyes BID   metoprolol succinate  50 mg Oral Daily   pregabalin  50 mg Oral Daily   sertraline  50 mg Oral Daily   sucroferric oxyhydroxide  1,000 mg Oral TID WC   And   sucroferric oxyhydroxide  500 mg Oral Daily   Continuous Infusions: PRN Meds:.hydrOXYzine, oxyCODONE-acetaminophen Allergies  Allergen Reactions   Ace Inhibitors Anaphylaxis and Swelling   Penicillins Itching, Swelling and Other (See Comments)    Did it involve swelling of the face/tongue/throat, SOB, or low BP? Unknown Did it involve sudden or severe rash/hives, skin peeling, or any reaction on the inside of your mouth or nose? Unknown Did you need to seek medical attention at a hospital or doctor's office? Unknown When did it last happen?      years  If all above answers are "NO", may proceed with cephalosporin use.    Statins Other (  See Comments)    elevated LFT's     Albuterol Swelling    OBJECTIVE: Vitals:   09/07/22 1325 09/07/22 1941 09/08/22 0421 09/08/22 0847  BP: (!) 146/57 (!) 143/59 (!) 130/54 (!) 159/61  Pulse: 61 62 67 66  Resp:  17 17 17   Temp: 98.1 F (36.7 C) 99 F (37.2 C) 97.9 F (36.6 C) 98.1 F (36.7 C)  TempSrc: Oral   Oral  SpO2: 100% 97% 96% 99%  Weight:      Height:       Body mass index is 30.16 kg/m.  Physical Exam Constitutional:      Appearance: Normal appearance.  HENT:     Head: Normocephalic and atraumatic.     Right Ear: Tympanic  membrane normal.     Left Ear: Tympanic membrane normal.     Nose: Nose normal.     Mouth/Throat:     Mouth: Mucous membranes are moist.  Eyes:     Extraocular Movements: Extraocular movements intact.     Conjunctiva/sclera: Conjunctivae normal.     Pupils: Pupils are equal, round, and reactive to light.  Cardiovascular:     Rate and Rhythm: Normal rate and regular rhythm.     Heart sounds: No murmur heard.    No friction rub. No gallop.  Pulmonary:     Effort: Pulmonary effort is normal.     Breath sounds: Normal breath sounds.  Abdominal:     General: Abdomen is flat.     Palpations: Abdomen is soft.  Musculoskeletal:     Comments: Left foot bandaged  Skin:    General: Skin is warm and dry.  Neurological:     General: No focal deficit present.     Mental Status: She is alert and oriented to person, place, and time.  Psychiatric:        Mood and Affect: Mood normal.       Lab Results Lab Results  Component Value Date   WBC 10.6 (H) 09/08/2022   HGB 8.3 (L) 09/08/2022   HCT 27.1 (L) 09/08/2022   MCV 80.9 09/08/2022   PLT 336 09/08/2022    Lab Results  Component Value Date   CREATININE 5.49 (H) 09/08/2022   BUN 53 (H) 09/08/2022   NA 137 09/08/2022   K 4.4 09/08/2022   CL 98 09/08/2022   CO2 24 09/08/2022    Lab Results  Component Value Date   ALT 16 09/08/2022   AST 13 (L) 09/08/2022   ALKPHOS 85 09/08/2022   BILITOT 0.5 09/08/2022        Danelle Earthly, MD [facility] 09/08/2022, 1:56 PM   I have personally spent 52 minutes involved in face-to-face and non-face-to-face activities for this patient on the day of the visit. Professional time spent includes the following activities: Preparing to see the patient (review of tests), Obtaining and/or reviewing separately obtained history (admission/discharge record), Performing a medically appropriate examination and/or evaluation , Ordering  medications/tests/procedures, referring and communicating with other health care professionals, Documenting clinical information in the EMR, Independently interpreting results (not separately reported), Communicating results to the patient/family/caregiver, Counseling and educating the patient/family/caregiver and Care coordination (not separately reported).

## 2022-09-08 NOTE — Progress Notes (Signed)
Mobility Specialist Progress Note   09/08/22 0950  Mobility  Activity Moved into chair position in bed (Bed level exercise)  Level of Assistance Standby assist, set-up cues, supervision of patient - no hands on  Assistive Device None  Range of Motion/Exercises Active;All extremities  Activity Response Tolerated well   Patient received in supine, initially reluctant but agreeable to participate with encouragement. Deferred OOB activity due to getting discharged. Opted to bed level exercise, completing with supervision with focus on LLE. Tolerated without complaint or incident. Was left in supine with all needs met, call bell in reach.   Kathryn Beck, BS EXP Mobility Specialist Please contact via SecureChat or Rehab office at [phone]

## 2022-09-08 NOTE — Care Management Important Message (Signed)
Important Message  Patient Details  Name: Kathryn Beck MRN: [id number] Date of Birth: [birth year]   Medicare Important Message Given:  Yes     Sherilyn Banker 09/08/2022, 2:01 PM

## 2022-09-08 NOTE — Progress Notes (Signed)
[facility] Progress Note   Subjective:  Seen in room - completed dialysis yesterday 1.5L removed. Feels better. Plans for discharge to SNF today   Objective Vitals:   09/07/22 1325 09/07/22 1941 09/08/22 0421 09/08/22 0847  BP: (!) 146/57 (!) 143/59 (!) 130/54 (!) 159/61  Pulse: 61 62 67 66  Resp:  17 17 17   Temp: 98.1 F (36.7 C) 99 F (37.2 C) 97.9 F (36.6 C) 98.1 F (36.7 C)  TempSrc: Oral   Oral  SpO2: 100% 97% 96% 99%  Weight:      Height:       Physical Exam General: Well appearing woman, NAD. Room air Heart: RRR; 3/6 murmur Lungs: CTA anteriorly Abdomen: soft Extremities: no LE edema, L foot in dressing Dialysis Access:  LUE AVF + bruit  Additional Objective Labs: Basic Metabolic Panel: Recent Labs  Lab 09/06/22 0101 09/07/22 0120 09/08/22 0914  NA 136 138 137  K 3.6 4.2 4.4  CL 97* 98 98  CO2 25 25 24   GLUCOSE 251* 199* 260*  BUN 63* 85* 53*  CREATININE 6.57* 8.17* 5.49*  CALCIUM 9.1 9.3 9.0  PHOS 4.5 4.6 4.6    Liver Function Tests: Recent Labs  Lab 09/06/22 0101 09/07/22 0120 09/08/22 0914  AST 12* 11* 13*  ALT 20 17 16   ALKPHOS 89 89 85  BILITOT 0.7 0.7 0.5  PROT 6.9 6.6 6.7  ALBUMIN 2.7* 2.5* 2.7*    CBC: Recent Labs  Lab 09/04/22 0125 09/04/22 1345 09/05/22 0243 09/06/22 0101 09/07/22 0120 09/08/22 0914  WBC 9.3  --  16.9* 14.4* 13.5* 10.6*  NEUTROABS 6.5  --  14.5* 11.3* 9.9* 8.2*  HGB 8.1*   < > 8.1* 8.2* 7.5* 8.3*  HCT 26.5*   < > 26.8* 27.1* 24.5* 27.1*  MCV 81.3  --  80.5 79.2* 79.8* 80.9  PLT 350  --  347 315 311 336   < > = values in this interval not displayed.    Blood Culture    Component Value Date/Time   SDES WOUND 09/04/2022 1456   SPECREQUEST RESIDUAL FIRST METATARSAL 09/04/2022 1456   CULT  09/04/2022 1456    RARE ENTEROBACTER HORMAECHEI NO ANAEROBES ISOLATED; CULTURE IN PROGRESS FOR 5 DAYS    REPTSTATUS PENDING 09/04/2022 1456   CBG: Recent Labs  Lab 09/07/22 0809  09/07/22 1319 09/07/22 1610 09/07/22 1942 09/08/22 0849  GLUCAP 163* 148* 223* 298* 260*    Iron Studies:  No results for input(s): "IRON", "TIBC", "TRANSFERRIN", "FERRITIN" in the last 72 hours.  Studies/Results: No results found. Medications:    (feeding supplement) PROSource Plus  30 mL Oral BID BM   aspirin  81 mg Oral Daily   Chlorhexidine Gluconate Cloth  6 each Topical Q0600   cinacalcet  90 mg Oral Once per day on Mon Wed Fri   ciprofloxacin  500 mg Oral q1800   doxycycline  100 mg Oral Q12H   DULoxetine  60 mg Oral Daily   ezetimibe  10 mg Oral Daily   furosemide  80 mg Oral BID   heparin  5,000 Units Subcutaneous Q12H   insulin aspart  0-5 Units Subcutaneous QHS   insulin aspart  0-9 Units Subcutaneous TID WC   insulin aspart  3 Units Subcutaneous TID WC   insulin glargine-yfgn  20 Units Subcutaneous Daily   ketotifen  1 drop Both Eyes BID   metoprolol succinate  50 mg Oral Daily   pregabalin  50 mg Oral Daily  sertraline  50 mg Oral Daily   sucroferric oxyhydroxide  1,000 mg Oral TID WC   And   sucroferric oxyhydroxide  500 mg Oral Daily    Dialysis Orders: MWF at [facility] 3:45hr, 400/500, EDW 80.5kg, 2K/2.5Ca, UFP#2, LUE AVF, heparin 1000 unit initial + 1000 unit/hr - Sensipar 90mg  q HD, no VDRA - Mircera IV q 2 weeks - last 09/02/22   Assessment/Plan: 1. L foot TMA dehiscence/infection:s/p debridement/revision 6/14. Intra op wound/bone Cx sent--RARE ENTEROBACTER HORMAECHEI Per ID continue PO cipro and doxycycline for 6 weeks.  2. ESRD: Continue MWF schedule - next HD 6/19  3. HTN/volume: BP controlled. Continue home meds, she is slightly below prior EDW. 4. Anemia of ESRD: Hgb 8.2 - given low dose ESA earlier this week, not due until 6/26. Transfuse prn 5. Secondary hyperparathyroidism: Ca/Phos ok - continue  [name] as binder, sensipar. 6. Nutrition:  Alb low, continue protein supp. 7. T2DM: Insulin per primary. 8. Dispo - For discharge to  SNF today   Tomasa Blase PA-C [facility] 09/08/2022,11:30 AM

## 2022-09-08 NOTE — Telephone Encounter (Signed)
Patient was discharged from the hospital today and will need a follow-up. Dr. Lilian Kapur did the surgery but is out of the office. She can see myself, or whoever has an opening. Thanks!

## 2022-09-08 NOTE — TOC Progression Note (Addendum)
Transition of Care [facility]) - Progression Note    Patient Details  Name: Kathryn Beck MRN: [id number] Date of Birth: Mar 07, 1956  [facility]) CM/SW Contact  Lorri Frederick, LCSW Phone Number: 09/08/2022, 11:56 AM  Clinical Narrative:   Additional info uploaded to Navi portal as requested.    CSW spoke with [facility].  She can receive pt today if Berkley Harvey is approved by 2pm.  They can transport to HD tomorrow if pt does come this afternoon.   Tracy/Renal aware for the outpt HD.  1215: Auth approved: [id number], [id number], 3 days: 6/18-6/20.    TC Andree Elk, covering for [employer].  He will alert [facility] that pt will start tomorrow.  Expected Discharge Plan: Skilled Nursing Facility Barriers to Discharge: Continued Medical Work up  Expected Discharge Plan and Services In-house Referral: Clinical Social Work     Living arrangements for the past 2 months: Single Family Home Expected Discharge Date: 09/08/22                                     Social Determinants of Health (SDOH) Interventions SDOH Screenings   Food Insecurity: No Food Insecurity (09/03/2022)  Housing: Low Risk  (07/18/2022)  Transportation Needs: No Transportation Needs (09/03/2022)  Utilities: Not At Risk (09/03/2022)  Alcohol Screen: Low Risk  (05/12/2022)  Depression (PHQ2-9): Low Risk  (07/07/2022)  Recent Concern: Depression (PHQ2-9) - High Risk (05/12/2022)  Financial Resource Strain: Medium Risk (05/12/2022)  Physical Activity: Inactive (05/12/2022)  Social Connections: Socially Integrated (05/12/2022)  Stress: No Stress Concern Present (05/12/2022)  Tobacco Use: Low Risk  (09/05/2022)    Readmission Risk Interventions    05/01/2022    3:54 PM  Readmission Risk Prevention Plan  Transportation Screening Complete  HRI or Home Care Consult Complete  Palliative Care Screening Not Applicable  Medication Review (RN Care Manager) Complete

## 2022-09-08 NOTE — TOC Transition Note (Signed)
Transition of Care [facility]) - CM/SW Discharge Note   Patient Details  Name: Kathryn Beck MRN: [id number] Date of Birth: June 20, 1955  [facility]) CM/SW Contact:  Lorri Frederick, LCSW Phone Number: 09/08/2022, 12:36 PM   Clinical Narrative:   Pt discharging to [facility].  RN call report to [phone].    Final next level of care: Skilled Nursing Facility Barriers to Discharge: Barriers Resolved   Patient Goals and CMS Choice CMS Medicare.gov Compare Post Acute Care list provided to:: Patient Choice offered to / list presented to : Patient  Discharge Placement                Patient chooses bed at:  [facility]) Patient to be transferred to facility by: [facility] Name of family member notified: daughter [name] Patient and family notified of of transfer: 09/08/22  Discharge Plan and Services Additional resources added to the After Visit Summary for   In-house Referral: Clinical Social Work                                   Social Determinants of Health (SDOH) Interventions SDOH Screenings   Food Insecurity: No Food Insecurity (09/03/2022)  Housing: Low Risk  (07/18/2022)  Transportation Needs: No Transportation Needs (09/03/2022)  Utilities: Not At Risk (09/03/2022)  Alcohol Screen: Low Risk  (05/12/2022)  Depression (PHQ2-9): Low Risk  (07/07/2022)  Recent Concern: Depression (PHQ2-9) - High Risk (05/12/2022)  Financial Resource Strain: Medium Risk (05/12/2022)  Physical Activity: Inactive (05/12/2022)  Social Connections: Socially Integrated (05/12/2022)  Stress: No Stress Concern Present (05/12/2022)  Tobacco Use: Low Risk  (09/05/2022)     Readmission Risk Interventions    05/01/2022    3:54 PM  Readmission Risk Prevention Plan  Transportation Screening Complete  HRI or Home Care Consult Complete  Palliative Care Screening Not Applicable  Medication Review (RN Care Manager) Complete

## 2022-09-08 NOTE — Progress Notes (Signed)
D/C order noted. Contacted CSW who states insurance Berkley Harvey is still pending for snf. Contacted [facility] to advise clinic that pt may d/c later today if snf auth received. Will assist as needed.   Olivia Canter Renal Navigator [phone]  Addendum at 11:45 am: Spoke to [place] at [facility] to alert clinic that per CSW insurance Berkley Harvey should be available this afternoon for pt to d/c to snf. Clinic aware to expect pt tomorrow. D/C summary and last renal note faxed to clinic for continuation of care.

## 2022-09-08 NOTE — Progress Notes (Signed)
Report Called to [place] at [facility].

## 2022-09-08 NOTE — Discharge Summary (Signed)
Physician Discharge Summary   Patient: Kathryn Beck MRN: [id number] DOB: 1955-04-16  Admit date:     09/03/2022  Discharge date: 09/08/22  Discharge Physician: Marguerita Merles, DO   PCP: [name], MD   Recommendations at discharge:   Follow-up with PCP within 1 to 2 weeks and repeat CBC, CMP, mag, Phos within 1 week Follow-up with podiatry in outpatient setting and continue with cam boot and nonweightbearing to the lower extremity and avoid any compression of the dressing on the foot while at SNF; follow-up with podiatry within 1 week at discharge and keep dressing to be clean dry and intact until evaluated by Dr. Ralene Cork Follow-up with nephrology and continue maintenance of dialysis Follow-up with infectious diseases in outpatient setting Dr. Thedore Mins and continue ciprofloxacin and doxycycline for 6 weeks  Discharge Diagnoses: Principal Problem:   Osteomyelitis (HCC) Active Problems:   Acute osteomyelitis of toe of left foot (HCC)   ESRD on hemodialysis (HCC)   Chronic kidney disease, stage 4 (severe) (HCC)   Anemia of chronic disease   Left foot infection   Status post transmetatarsal amputation of foot, left (HCC)   Wound infection after surgery  Resolved Problems:   * No resolved hospital problems. 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  She underwent a left foot TMA and went home with a wound VAC in 1 week was found to the patient developed an infection at the  surgical site and she was started on doxycycline twice a day.  She continued to have constant bloody pus coming out of the left surgical wound and has severe neuropathy and does not feel a significant pain associate with a left foot wound.  She had to go see her podiatrist on a routine visit yesterday who evaluated and sent her to the ED for IV antibiotics and possible further surgical intervention given that her left foot continue to bleed and because the wound appeared to dehisce.    She was initiated on IV vancomycin, cefepime and Flagyl and podiatry was consulted and took the patient for a left foot transmetatarsal amputation revision with irrigation and debridement and infectious diseases consulted for further evaluation. She is POD  and complaining of Pain. Next HD is 09/09/22.  We were awaiting Cx's and now showing ENTEROBACTER HORMAECHEI and infectious diseases has changed her to oral ciprofloxacin and doxycycline.  She is improved and medically stable for discharge and will need to follow-up with PCP, podiatry, nephrology and infectious disease in outpatient setting and will go back to SNF for continued rehabilitation  Assessment and Plan:  Left foot recurrent Osteomyelitis and deep tissue infection, failed outpatient management s/p Left Transmetatarsal Amputation Revision and I and D -C/w with IV vancomycin cefepime and Flagyl -DG Foot done and showed "Interval amputation of the first through fifth digits to the proximal metatarsal shafts. There is bandage material overlying the distal soft tissues with postsurgical skin staples. The distal surgical wound appears to extend to the remaining distal bone. The surgical bone resection margins appear fairly sharp, and no definitive  radiographic evidence of osteomyelitis is seen. Recommend clinical correlation for evidence of infection contacting bone." -Discussed with on-call podiatrist PA, who ordered MRI -MRI done and showed "Postsurgical changes from  recent transmetatarsal amputation. Nonspecific marrow edema in each of the remaining metatarsals adjacent to the amputation margins without gross cortical destruction. This could be reactive or secondary to early osteomyelitis. Nonspecific ill-defined fluid adjacent to the metatarsal stumps, greatest medially. No well-defined fluid collections are identified. Nonspecific diffuse muscular edema which may relate to diabetes." -WBC Trend, Lactic Acid and Inflammatory Marker Trend: Recent Labs  Lab 08/19/22 0637 09/03/22 1216 09/03/22 1237 09/03/22 1400 09/03/22 1645 09/04/22 0125 09/05/22 0243 09/06/22 0101 09/07/22 0120 09/08/22 0914  WBC 13.8*  --  9.5  --   --  9.3 16.9* 14.4* 13.5* 10.6*  LATICACIDVEN  --    < >  --  1.5  --   --   --   --   --   --   CRP  --   --   --   --  0.9  --   --   --   --   --    < > = values in this interval not displayed.  -ESR was 70 -Likelihood of losing left foot is high and the Admitter briefly discussed with patient family. -She has some diarrhea and this is now improved but had a loose stool this morning likely in the setting of antibiotics -Podiatry consulted the patient for a left revision of the transmetatarsal amputation with irrigation and debridement and she is POD 4 and Podiatry recommending CAM boot and NWB to Lower Extremity and following C/x and recommeding to avoid any compression dressing on the Left foot at SNF -ID consulted for further evaluation and recommendations and recommended continuing IV Vancomycin and IV Cefepime per HD and oral Metronidazole 500 mg po BID and  now ID recommends changing to oral ciprofloxacin and doxycycline for 6 weeks with outpatient follow-up -Current Cx showing: Gram Stain RARE WBC PRESENT, PREDOMINANTLY PMN NO ORGANISMS SEEN Performed at [facility], [address] [place], [place] [address]  Culture RARE ENTEROBACTER HORMAECHEI NO ANAEROBES ISOLATED; CULTURE IN PROGRESS FOR 5 DAYS  Report Status  PENDING  Organism ID, Bacteria ENTEROBACTER HORMAECHEI  Resulting Agency [facility]     Susceptibility   Enterobacter hormaechei    MIC    CEFEPIME 4 INTERMEDI... Intermediate    CEFTAZIDIME >=64 RESIST... Resistant    CIPROFLOXACIN <=0.25 SENS... Sensitive    GENTAMICIN <=1 SENSITIVE Sensitive    IMIPENEM <=0.25 SENS... Sensitive    PIP/TAZO >=128 RESIS... Resistant    TRIMETH/SULFA <=20 SENSIT... Sensitive         -PT/OT recommending SNF when Stable for D/C and she is medically improved  PVD with HX of Right BKA -Underwent extensive treatment by vascular surgery including angiogram, intra-arterial lithotripsy and stenting recently -Continue Aspirin 81 mg po Daily; Plavix and not started for some reason and do not appear to be on her MAR now -Continue Ezetimibe 10 mg po Daily   -Will need to confirm Home Regimen with Pharmacy but appears to have a Statin Allergy so was not initiated in May due to allergy   IDDM with Insulin Resistance and Neuropathy -Most recent A1c 6.3 -C/w Pregablain 50 mg po Daily -Given her history of ESRD, will cut down long-acting insulin dosage and is now on Semglee 15 units sq Daily but will increase to 20 units Every Day and Adding Novolog  3 Units TID hospitalized and neck to resume home regimen at discharge -C/w SSI Sensitive Novolog AC/HS -CBG Trend: Recent Labs  Lab 09/06/22 1708 09/06/22 1929 09/07/22 0809 09/07/22 1319 09/07/22 1610 09/07/22 1942 09/08/22 0849  GLUCAP 320* 303* 163* 148* 223* 298* 260*    HTN -Continue Metoprolol Succinate 50 mg po Daily -Continue to Monitor BP per Protocol -Last BP reading was 159/61  HLD -C/w Ezetimibe 10 mg po Daily  ESRD on HD MWF -BUN/Cr Trend: Recent Labs  Lab 09/03/22 1237 09/04/22 0127 09/04/22 1345 09/05/22 0243 09/06/22 0101 09/07/22 0120 09/08/22 0914  BUN 36* 53* 26* 42* 63* 85* 53*  CREATININE 5.13* 5.95* 3.40* 4.87* 6.57* 8.17* 5.49*  -Nephrology consulted for further  evaluation and patient was dialyzed on 09/04/22 and on 09/07/22 -C/w Cinacalcet 90 mg po TIDwm and Furosemide 80 mg po BID as well as Sucroferric Oxyhydroxide 1000 mg po TIDwm and 500 mg po Custom -Avoid Nephrotoxic Medications, Contrast Dyes, Hypotension and Dehydration to Ensure Adequate Renal Perfusion and will need to Renally Adjust Meds -Continue to Monitor and Trend Renal Function carefully and repeat CMP within 1 week  Microcytic Anemia/Anemia of Chronic Disease -Hgb/Hct Trend: Recent Labs  Lab 09/03/22 1237 09/04/22 0125 09/04/22 1345 09/05/22 0243 09/06/22 0101 09/07/22 0120 09/08/22 0914  HGB 8.9* 8.1* 9.9* 8.1* 8.2* 7.5* 8.3*  HCT 29.8* 26.5* 29.0* 26.8* 27.1* 24.5* 27.1*  MCV 82.5 81.3  --  80.5 79.2* 79.8* 80.9  -Checked Anemia Panel and it showed an Iron level of 19, UIBC of 162, TIBC of 181, Saturation Ratios of 11%, Ferritin 885, Folate Level of 9.5, and Vitamin B12 Level of 572 -Continue to Monitor for S/Sx of Bleeding; no overt bleeding noted -Repeat CBC within 1 week  Depression and Anxiety -C/w Sertraline 50 mg po Daily  Hypoalbuminemia -Patient's Albumin Trend: Recent Labs  Lab 08/19/22 0637 09/03/22 1237 09/04/22 0125 09/05/22 0243 09/06/22 0101 09/07/22 0120 09/08/22 0914  ALBUMIN 2.6* 3.4* 2.9* 2.9* 2.7* 2.5* 2.7*  -Continue to Monitor and Trend and repeat CMP within 1 week  Obesity -Complicates overall prognosis and care -Estimated body mass index is 30.16 kg/m as calculated from the following:   Height as of this encounter: 5\' 4"  (1.626 m).   Weight as of this encounter: 79.7 kg.  -Weight Loss and Dietary Counseling given  Consultants: ID, Podiatry, Nephrology Procedures performed: As delineated as above  Disposition: Skilled nursing facility Diet recommendation:  Discharge Diet Orders (From admission, onward)     Start     Ordered   09/08/22 0000  Diet - low sodium heart healthy        09/08/22 1108   09/08/22 0000  Diet Carb Modified         09/08/22 1108           Renal diet DISCHARGE MEDICATION: Allergies as of 09/08/2022       Reactions   Ace Inhibitors Anaphylaxis, Swelling   Penicillins Itching, Swelling, Other (See Comments)   Did it involve swelling of the face/tongue/throat, SOB, or low BP? Unknown Did it involve sudden or severe rash/hives, skin peeling, or any reaction on the inside of your mouth or nose? Unknown Did you need to seek medical attention at a hospital or doctor's office? Unknown When did it last happen?      years  If all above answers are "NO", may proceed with cephalosporin use.   Statins Other (See Comments)   elevated LFT's   Albuterol Swelling  Medication List     STOP taking these medications    doxycycline 100 MG tablet Commonly known as: VIBRA-TABS Replaced by: doxycycline 100 MG capsule       TAKE these medications    (feeding supplement) PROSource Plus liquid Take 30 mLs by mouth 2 (two) times daily between meals.   aspirin 81 MG chewable tablet CHEW TWO TABLETS BY MOUTH EVERY DAY What changed: See the new instructions.   azelastine 0.05 % ophthalmic solution Commonly known as: OPTIVAR Place 1 drop into both eyes 2 (two) times daily.   calcitRIOL 0.25 MCG capsule Commonly known as: ROCALTROL TAKE 1 CAPSULE (0.25 MCG TOTAL) BY MOUTH 2 (TWO) TIMES DAILY WITH A MEAL.   cinacalcet 30 MG tablet Commonly known as: SENSIPAR Take 3 tablets (90 mg total) by mouth 3 (three) times a week. Start taking on: September 09, 2022   ciprofloxacin 500 MG tablet Commonly known as: Cipro Take 1 tablet (500 mg total) by mouth at bedtime for 42 doses. Take after hemodialysis on HD days   doxycycline 100 MG capsule Commonly known as: VIBRAMYCIN Take 1 capsule (100 mg total) by mouth 2 (two) times daily. Replaces: doxycycline 100 MG tablet   DULoxetine 60 MG capsule Commonly known as: CYMBALTA TAKE (1) CAPSULE BY MOUTH ONCE DAILY. What changed: See the new  instructions.   ezetimibe 10 MG tablet Commonly known as: ZETIA Take 1 tablet (10 mg total) by mouth daily.   Fiasp FlexTouch 100 UNIT/ML FlexTouch Pen Generic drug: insulin aspart Inject 10-20 Units into the skin See admin instructions. Inj 10-20 three times daily via sliding scale   FreeStyle Libre 2 Reader Devi 1 each by Does not apply route daily.   FreeStyle Libre 2 Sensor Misc 1 each by Does not apply route every 14 (fourteen) days.   furosemide 80 MG tablet Commonly known as: LASIX Take 80 mg by mouth 2 (two) times daily.   hydrOXYzine 25 MG tablet Commonly known as: ATARAX Take 25 mg by mouth every 12 (twelve) hours as needed for itching.   Insulin Pen Needle 32G X 4 MM Misc Use 4x a day   metoprolol succinate 50 MG 24 hr tablet Commonly known as: TOPROL-XL TAKE (1) TABLET BY MOUTH DAILY WITH FOOD *TAKE AFTER DIALYSIS* What changed:  how much to take how to take this when to take this additional instructions   OneTouch Delica Lancets 33G Misc Use to check blood sugar 4X daily.   OneTouch Verio test strip Generic drug: glucose blood Use as instructed to check blood sugar 4X daily.   OneTouch Verio w/Device Kit Use to check blood sugar 4X daily.   oxyCODONE-acetaminophen 5-325 MG tablet Commonly known as: PERCOCET/ROXICET Take 1 tablet by mouth every 4 (four) hours as needed.   pregabalin 50 MG capsule Commonly known as: LYRICA TAKE ONE CAPSULE BY MOUTH three times a day   Rena-Vite Rx 1 MG Tabs Take 1 tablet by mouth daily.   sertraline 50 MG tablet Commonly known as: ZOLOFT TAKE ONE TABLET BY MOUTH EVERY DAY   Toujeo Max SoloStar 300 UNIT/ML Solostar Pen Generic drug: insulin glargine (2 Unit Dial) Inject 30 Units into the skin daily. May need to slowly increase the dose depending upon your blood sugar, follow-up with PCP   [name] 500 MG chewable tablet Generic drug: sucroferric oxyhydroxide Chew 500-1,000 mg by mouth See admin  instructions. Take 1000mg  (2 tablets) by mouth with meals and 500mg  (1 tablet) with snacks  Discharge Exam: Filed Weights   09/04/22 1155 09/07/22 0829 09/07/22 1233  Weight: 79.4 kg 81.2 kg 79.7 kg   Vitals:   09/08/22 0421 09/08/22 0847  BP: (!) 130/54 (!) 159/61  Pulse: 67 66  Resp: 17 17  Temp: 97.9 F (36.6 C) 98.1 F (36.7 C)  SpO2: 96% 99%   Examination: Physical Exam:  Constitutional: WN/WD obese African-American female in no acute distress Respiratory: Diminished to auscultation bilaterally, no wheezing, rales, rhonchi or crackles. Normal respiratory effort and patient is not tachypenic. No accessory muscle use.  Unlabored breathing Cardiovascular: RRR, no murmurs / rubs / gallops. S1 and S2 auscultated. No extremity edema.  Abdomen: Soft, non-tender, distended secondary to body habitus. Bowel sounds positive.  GU: Deferred. Musculoskeletal: No clubbing / cyanosis of digits/nails.  The right BKA and left TMA that is wrapped in a left AV arm fistula Skin: No rashes, lesions, ulcers on a limited skin evaluation. No induration; Warm and dry.  Neurologic: CN 2-12 grossly intact with no focal deficits. Romberg sign and cerebellar reflexes not assessed.  Psychiatric: Normal judgment and insight. Alert and oriented x 3. Normal mood and appropriate affect.   Condition at discharge: stable  The results of significant diagnostics from this hospitalization (including imaging, microbiology, ancillary and laboratory) are listed below for reference.   Imaging Studies: DG Foot Complete Left  Result Date: 09/05/2022 CLINICAL DATA:  [id number] Osteomyelitis of left foot [facility]) [id number] EXAM: LEFT FOOT - COMPLETE 3+ VIEW COMPARISON:  September 03, 2022 FINDINGS: Status post surgical amputation of the distal metatarsals. Surgical margins demonstrate slight increased lucency along the fifth, fourth and first metatarsal distally. Severe atherosclerotic calcifications. Diffuse soft tissue  calcifications. Overlying surgical staples. IMPRESSION: Slight increased lucency along the surgical margins of the first, fourth and fifth metatarsals. This could reflect postsurgical changes versus osteomyelitis. Recommend close attention on follow-up. Electronically Signed   By: Meda Klinefelter M.D.   On: 09/05/2022 11:31   MR FOOT LEFT WO CONTRAST  Result Date: 09/03/2022 CLINICAL DATA:  Left transmetatarsal amputation 08/15/2022. Wound infection. History of end-stage renal disease and diabetes. EXAM: MRI OF THE LEFT FOOT WITHOUT CONTRAST TECHNIQUE: Multiplanar, multisequence MR imaging of the left foot was performed. No intravenous contrast was administered. COMPARISON:  Radiographs 09/03/2022 and 08/13/2022. No previous MRI. FINDINGS: Bones/Joint/Cartilage Postsurgical changes from recent transmetatarsal amputation. Skin staples remain in place, causing some susceptibility artifact. There is nonspecific marrow edema in each of the remaining metatarsals adjacent to the amputation margins. No gross cortical destruction. Mild degenerative changes at the Lisfranc joint without subluxation. No suspicious tarsal bone findings. No significant joint effusions. Ligaments Intact Lisfranc ligament. Muscles and Tendons Nonspecific diffuse muscular edema which may relate to diabetes. No intramuscular fluid collections are demonstrated. Soft tissues Nonspecific ill-defined fluid adjacent to the metatarsal stumps, greatest medially. No well-defined fluid collections are identified. There is mild nonspecific generalized subcutaneous edema. IMPRESSION: 1. Postsurgical changes from recent transmetatarsal amputation. 2. Nonspecific marrow edema in each of the remaining metatarsals adjacent to the amputation margins without gross cortical destruction. This could be reactive or secondary to early osteomyelitis. 3. Nonspecific ill-defined fluid adjacent to the metatarsal stumps, greatest medially. No well-defined fluid  collections are identified. 4. Nonspecific diffuse muscular edema which may relate to diabetes. Electronically Signed   By: Carey Bullocks M.D.   On: 09/03/2022 16:02   DG Foot 2 Views Left  Result Date: 09/03/2022 CLINICAL DATA:  Infection.  Open wound. EXAM: LEFT FOOT - 2 VIEW COMPARISON:  Left foot radiographs 08/13/2022 FINDINGS: Interval amputation of the first through fifth digits to the proximal metatarsal shafts. There is bandage material overlying the distal soft tissues with postsurgical skin staples. The surgical bone resection margins appear fairly sharp. The acuity of the surgery and lack of expected sclerosis limit evaluation for early erosions. On lateral view the distal surgical wound appears to extend to the remaining distal bone. Moderate plantar and posterior calcaneal heel spurs. High-grade atherosclerotic calcifications. Additional distal calf subcutaneous fat lobule calcifications from likely chronic venous stasis, similar to prior. IMPRESSION: 1. Interval amputation of the first through fifth digits to the proximal metatarsal shafts. 2. There is bandage material overlying the distal soft tissues with postsurgical skin staples. The distal surgical wound appears to extend to the remaining distal bone. The surgical bone resection margins appear fairly sharp, and no definitive radiographic evidence of osteomyelitis is seen. Recommend clinical correlation for evidence of infection contacting bone. Electronically Signed   By: Neita Garnet M.D.   On: 09/03/2022 13:53   DG Foot Complete Left  Result Date: 08/13/2022 Please see detailed radiograph report in office note.  DG Chest 2 View  Result Date: 08/13/2022 CLINICAL DATA:  Toe infection. EXAM: CHEST - 2 VIEW COMPARISON:  Chest radiograph 04/24/2022 FINDINGS: The cardiac silhouette remains mildly enlarged. Lung volumes are low. No acute airspace opacity, edema, sizable pleural effusion, or pneumothorax is identified. Scarring is again  noted in the left mid to lower lung. No acute osseous abnormality is seen. IMPRESSION: No active cardiopulmonary disease. Electronically Signed   By: Sebastian Ache M.D.   On: 08/13/2022 15:28    Microbiology: Results for orders placed or performed during the hospital encounter of 09/03/22  Blood Cultures x 2 sites     Status: None   Collection Time: 09/03/22  1:30 PM   Specimen: BLOOD  Result Value Ref Range Status   Specimen Description BLOOD SITE NOT SPECIFIED  Final   Special Requests   Final    BOTTLES DRAWN AEROBIC AND ANAEROBIC Blood Culture results may not be optimal due to an inadequate volume of blood received in culture bottles   Culture   Final    NO GROWTH 5 DAYS Performed at [facility], [address], [place], [place] [address]    Report Status 09/08/2022 FINAL  Final  Blood Cultures x 2 sites     Status: None   Collection Time: 09/03/22  1:40 PM   Specimen: BLOOD  Result Value Ref Range Status   Specimen Description BLOOD SITE NOT SPECIFIED  Final   Special Requests   Final    BOTTLES DRAWN AEROBIC AND ANAEROBIC Blood Culture adequate volume   Culture   Final    NO GROWTH 5 DAYS Performed at [facility], [address] [place], [place] [address]    Report Status 09/08/2022 FINAL  Final  Surgical pcr screen     Status: None   Collection Time: 09/04/22  5:01 AM   Specimen: Nasal Mucosa; Nasal Swab  Result Value Ref Range Status   MRSA, PCR NEGATIVE NEGATIVE Final   Staphylococcus aureus NEGATIVE NEGATIVE Final    Comment: (NOTE) The Xpert SA Assay (FDA approved for NASAL specimens in patients 108 years of age and older), is one component of a comprehensive surveillance program. It is not intended to diagnose infection nor to guide or monitor treatment. Performed at [facility], [address] [place], [place] [address]   Aerobic/Anaerobic Culture  w Gram Stain (surgical/deep wound)     Status: None (Preliminary result)   Collection Time:  09/04/22  2:56 PM   Specimen: PATH Digit amputation; Tissue  Result Value Ref Range Status   Specimen Description WOUND  Final   Special Requests RESIDUAL FIRST METATARSAL  Final   Gram Stain   Final    RARE WBC PRESENT, PREDOMINANTLY PMN NO ORGANISMS SEEN Performed at [facility], [address] [place], [place] [address]    Culture   Final    RARE ENTEROBACTER HORMAECHEI NO ANAEROBES ISOLATED; CULTURE IN PROGRESS FOR 5 DAYS    Report Status PENDING  Incomplete   Organism ID, Bacteria ENTEROBACTER HORMAECHEI  Final      Susceptibility   Enterobacter hormaechei - MIC*    CEFEPIME 4 INTERMEDIATE Intermediate     CEFTAZIDIME >=64 RESISTANT Resistant     CIPROFLOXACIN <=0.25 SENSITIVE Sensitive     GENTAMICIN <=1 SENSITIVE Sensitive     IMIPENEM <=0.25 SENSITIVE Sensitive     TRIMETH/SULFA <=20 SENSITIVE Sensitive     PIP/TAZO >=128 RESISTANT Resistant     * RARE ENTEROBACTER HORMAECHEI   *Note: Due to a large number of results and/or encounters for the requested time period, some results have not been displayed. A complete set of results can be found in Results Review.   Labs: CBC: Recent Labs  Lab 09/04/22 0125 09/04/22 1345 09/05/22 0243 09/06/22 0101 09/07/22 0120 09/08/22 0914  WBC 9.3  --  16.9* 14.4* 13.5* 10.6*  NEUTROABS 6.5  --  14.5* 11.3* 9.9* 8.2*  HGB 8.1* 9.9* 8.1* 8.2* 7.5* 8.3*  HCT 26.5* 29.0* 26.8* 27.1* 24.5* 27.1*  MCV 81.3  --  80.5 79.2* 79.8* 80.9  PLT 350  --  347 315 311 336   Basic Metabolic Panel: Recent Labs  Lab 09/04/22 0125 09/04/22 0127 09/04/22 1345 09/05/22 0243 09/06/22 0101 09/07/22 0120 09/08/22 0914  NA  --  140 139 138 136 138 137  K  --  3.5 3.5 3.6 3.6 4.2 4.4  CL  --  102 97* 98 97* 98 98  CO2  --  24  --  26 25 25 24   GLUCOSE  --  246* 95 240* 251* 199* 260*  BUN  --  53* 26* 42* 63* 85* 53*  CREATININE  --  5.95* 3.40* 4.87* 6.57* 8.17* 5.49*  CALCIUM  --  8.6*  --  8.8* 9.1 9.3 9.0  MG 2.1  --   --  1.8  1.9 2.3 1.9  PHOS 5.1*  --   --  3.7 4.5 4.6 4.6   Liver Function Tests: Recent Labs  Lab 09/04/22 0125 09/05/22 0243 09/06/22 0101 09/07/22 0120 09/08/22 0914  AST 16 16 12* 11* 13*  ALT 22 23 20 17 16   ALKPHOS 96 96 89 89 85  BILITOT 0.7 0.7 0.7 0.7 0.5  PROT 6.7 6.8 6.9 6.6 6.7  ALBUMIN 2.9* 2.9* 2.7* 2.5* 2.7*   CBG: Recent Labs  Lab 09/07/22 0809 09/07/22 1319 09/07/22 1610 09/07/22 1942 09/08/22 0849  GLUCAP 163* 148* 223* 298* 260*   Discharge time spent: greater than 30 minutes.  Signed: Marguerita Merles, DO [facility] 09/08/2022

## 2022-09-09 DIAGNOSIS — Z992 Dependence on renal dialysis: Secondary | ICD-10-CM | POA: Diagnosis not present

## 2022-09-09 DIAGNOSIS — N2581 Secondary hyperparathyroidism of renal origin: Secondary | ICD-10-CM | POA: Diagnosis not present

## 2022-09-09 DIAGNOSIS — N186 End stage renal disease: Secondary | ICD-10-CM | POA: Diagnosis not present

## 2022-09-09 LAB — AEROBIC/ANAEROBIC CULTURE W GRAM STAIN (SURGICAL/DEEP WOUND)

## 2022-09-10 ENCOUNTER — Ambulatory Visit (INDEPENDENT_AMBULATORY_CARE_PROVIDER_SITE_OTHER): Payer: Medicare PPO | Admitting: Podiatry

## 2022-09-10 DIAGNOSIS — L97522 Non-pressure chronic ulcer of other part of left foot with fat layer exposed: Secondary | ICD-10-CM

## 2022-09-10 DIAGNOSIS — Z89432 Acquired absence of left foot: Secondary | ICD-10-CM

## 2022-09-11 DIAGNOSIS — Z992 Dependence on renal dialysis: Secondary | ICD-10-CM | POA: Diagnosis not present

## 2022-09-11 DIAGNOSIS — N186 End stage renal disease: Secondary | ICD-10-CM | POA: Diagnosis not present

## 2022-09-11 DIAGNOSIS — N2581 Secondary hyperparathyroidism of renal origin: Secondary | ICD-10-CM | POA: Diagnosis not present

## 2022-09-12 NOTE — Progress Notes (Signed)
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 Ralene Cork on 09/04/2022.  She is currently on doxycycline, ciprofloxacin.  She states that the dressings have been changed at her facility.  She does not report any fevers or chills.  Objective: AAO x3, NAD Pulses decreased Status post transmetatarsal amputation left side.  Centrally the incision appears to be healing well.  However I am concerned about both medial and lateral aspects of the incision.  There is some bloody drainage and some macerated tissue noted today.  There is no purulence.  There is no fluctuation, crepitation, malodor. No pain with calf compression, swelling, warmth, erythema        Assessment:  Plan: -All treatment options discussed with the patient including all alternatives, risks, complications.  -Culture growing rare Enterococcus, sensitive to ciprofloxacin. -Continue antibiotics. -Patient was applied followed by dressing.  Continue daily dressing changes.  Nonweightbearing, elevation. -Still at high risk of nonhealing and need to monitor closely. -Monitor for any clinical signs or symptoms of infection and directed to call the office immediately should any occur or go to the ER. -Patient encouraged to call the office with any questions, concerns, change in symptoms.   Vivi Barrack DPM

## 2022-09-14 ENCOUNTER — Ambulatory Visit: Payer: Self-pay | Admitting: *Deleted

## 2022-09-14 ENCOUNTER — Encounter: Payer: Self-pay | Admitting: *Deleted

## 2022-09-14 DIAGNOSIS — Z992 Dependence on renal dialysis: Secondary | ICD-10-CM | POA: Diagnosis not present

## 2022-09-14 DIAGNOSIS — N2581 Secondary hyperparathyroidism of renal origin: Secondary | ICD-10-CM | POA: Diagnosis not present

## 2022-09-14 DIAGNOSIS — N186 End stage renal disease: Secondary | ICD-10-CM | POA: Diagnosis not present

## 2022-09-14 NOTE — Patient Outreach (Signed)
Care Coordination   Follow Up Visit Note   09/14/2022  Name: Kathryn Beck MRN: [id number] DOB: [birth year]  Kathryn Beck is a 67 y.o. year old female who sees [name], MD for primary care. I spoke with patient's daughter, [name] by phone today.  What matters to the patients health and wellness today?  Receive Assistance Applying for Aid & Attendance Benefits through [employer].   Goals Addressed             This Visit's Progress    Receive Assistance Applying for Aid & Attendance Benefits, through [employer].   On track    Care Coordination Interventions:  Interventions Today    Flowsheet Row Most Recent Value  Chronic Disease   Chronic disease during today's visit Diabetes, Hypertension (HTN), Chronic Kidney Disease/End Stage Renal Disease (ESRD), Congestive Heart Failure (CHF), Other  [Neuropathy, Chronic Pain Syndrome, Anxiety, Depression, Memory Loss, Neurocognitive Disorder & Requires Maximum Assistance with Activities of Daily Living]  General Interventions   General Interventions Discussed/Reviewed General Interventions Reviewed, Annual Eye Exam, General Interventions Discussed, Labs, Annual Foot Exam, Lipid Profile, Durable Medical Equipment (DME), Vaccines, Health Screening, [employer], Doctor Visits, Communication with, Level of Care  [Primary Care Provider]  Labs Hgb A1c every 3 months, Kidney Function  Vaccines COVID-19, Flu, Pneumonia, RSV, Shingles, Tetanus/Pertussis/Diphtheria  [Encouraged]  Doctor Visits Discussed/Reviewed Doctor Visits Discussed, Doctor Visits Reviewed, Annual Wellness Visits, PCP, Specialist  [Encouraged]  Health Screening Colonoscopy, Mammogram  [Encouraged]  Durable Medical Equipment (DME) BP Cuff, Glucomoter, Environmental consultant, Wheelchair, Physicist, medical  PCP/Specialist Visits Compliance with follow-up visit  Communication with PCP/Specialists, RN  Level of Care Adult  Daycare, Air traffic controller, Assisted Living, Skilled Nursing Facility, Teaching laboratory technician Medicaid, Personal Care Services  Exercise Interventions   Exercise Discussed/Reviewed Exercise Discussed, Exercise Reviewed, Physical Activity, Assistive device use and maintanence  Physical Activity Discussed/Reviewed Physical Activity Discussed, Physical Activity Reviewed, Types of exercise, Home Exercise Program (HEP)  [Encouraged]  Education Interventions   Education Provided Provided Therapist, sports, Provided Web-based Education, Provided Education  Provided Verbal Education On Nutrition, Foot Care, Eye Care, Labs, Blood Sugar Monitoring, Mental Health/Coping with Illness, Applications, Exercise, Medication, When to see the doctor, [employer], Human resources officer, Personal Care Services  Mental Health Interventions   Mental Health Discussed/Reviewed Mental Health Discussed, Mental Health Reviewed, Coping Strategies, Crisis, Anxiety, Depression, Grief and Loss, Substance Abuse, Suicide  Nutrition Interventions   Nutrition Discussed/Reviewed Nutrition Discussed, Nutrition Reviewed, Adding fruits and vegetables, Fluid intake, Decreasing sugar intake, Increaing proteins, Decreasing fats, Decreasing salt  Pharmacy Interventions   Pharmacy Dicussed/Reviewed Pharmacy Topics Discussed, Pharmacy Topics Reviewed, Medication Adherence, Affording Medications, Referral to Pharmacist  Medication Adherence Unable to refill medication  Referral to Pharmacist Cannot afford medications  Safety Interventions   Safety Discussed/Reviewed Safety Discussed, Safety Reviewed, Fall Risk, Home Safety  Home Safety Assistive Devices, Need for home safety assessment, Refer for home visit, Contact provider for referral to PT/OT, Refer for community resources, Contact home health agency  Advanced Directive Interventions   Advanced Directives Discussed/Reviewed Advanced Directives Discussed,  Advanced Directives Reviewed, Advanced Care Planning, Provided resource for acquiring and filling out documents     Active Listening & Reflection Utilized.  Emotional Support Provided. Verbalization of Feelings Encouraged.  Feelings of Aggravation & Anxiety Acknowledged. Symptoms of Left Food Surgical Pain Validated. Caregiver Resources Reviewed. Solution-Focused Strategies Implemented. Problem Solving Interventions Employed. Task-Centered Solutions Activated.  Cognitive Behavioral Therapy Initiated.  Client-Centered Therapy Performed. Acceptance & Commitment Therapy Indicated. CSW Collaboration with Daughter, [name] to Confirm Patient's Return to [facility] [phone]), On 09/08/2022, After Being Hospitalized on 09/03/2022 for Dehisced & Worsening Infection to Left Foot Surgical Wound.  CSW Collaboration with Daughter, [name] to Encourage Consideration of Long-Term Care Placement for Patient, Upon Discharge from Short-Term Rehab at [facility] [phone]).  CSW Collaboration with Daughter, [name] to Encourage Continued Completion of Aid & Attendance Benefits Application & Submission to [facility] [phone]), in An Effort to Obtain Care & Supervision for Patient in The Home. CSW Collaboration with Daughter, [name] to [employer] with Representative from [facility] [phone]), to Schedule Appointment to Receive Assistance with Completion & Submission of Aid & Attendance Benefits Application, if Necessary. CSW Collaboration with Daughter, [name] to [employer] with CSW [phone]), if She Has Questions, Needs Assistance, or If Additional Social Work Needs Are Identified Between Now & Our Next Scheduled Follow-Up Outreach Call.      SDOH assessments and interventions completed:   Yes.  Care Coordination Interventions:  Yes, provided.   Follow up plan: Follow up call scheduled for 09/28/2022 at 12:45 pm.  Encounter Outcome:  Pt. Visit Completed.   Danford Bad, BSW, MSW, LCSW  Licensed Restaurant manager, [employer] [facility]  Mailing [place] [address] [place], [place] [address] Physical Address[address] [place], [place] [address] Toll Free Main # [phone] Fax # [phone] Cell # [phone] [email or web address]

## 2022-09-14 NOTE — Patient Instructions (Signed)
Visit Information  Thank you for taking time to visit with me today. Please don't hesitate to contact me if I can be of assistance to you.   Following are the goals we discussed today:   Goals Addressed             This Visit's Progress    Receive Assistance Applying for Aid & Attendance Benefits, through [employer].   On track    Care Coordination Interventions:  Interventions Today    Flowsheet Row Most Recent Value  Chronic Disease   Chronic disease during today's visit Diabetes, Hypertension (HTN), Chronic Kidney Disease/End Stage Renal Disease (ESRD), Congestive Heart Failure (CHF), Other  [Neuropathy, Chronic Pain Syndrome, Anxiety, Depression, Memory Loss, Neurocognitive Disorder & Requires Maximum Assistance with Activities of Daily Living]  General Interventions   General Interventions Discussed/Reviewed General Interventions Reviewed, Annual Eye Exam, General Interventions Discussed, Labs, Annual Foot Exam, Lipid Profile, Durable Medical Equipment (DME), Vaccines, Health Screening, [employer], Doctor Visits, Communication with, Level of Care  [Primary Care Provider]  Labs Hgb A1c every 3 months, Kidney Function  Vaccines COVID-19, Flu, Pneumonia, RSV, Shingles, Tetanus/Pertussis/Diphtheria  [Encouraged]  Doctor Visits Discussed/Reviewed Doctor Visits Discussed, Doctor Visits Reviewed, Annual Wellness Visits, PCP, Specialist  [Encouraged]  Health Screening Colonoscopy, Mammogram  [Encouraged]  Durable Medical Equipment (DME) BP Cuff, Glucomoter, Environmental consultant, Wheelchair, Physicist, medical  PCP/Specialist Visits Compliance with follow-up visit  Communication with PCP/Specialists, RN  Level of Care Adult Daycare, Air traffic controller, Assisted Living, Skilled Nursing Facility, Teaching laboratory technician Medicaid, Personal Care Services  Exercise Interventions   Exercise Discussed/Reviewed Exercise Discussed, Exercise Reviewed, Physical  Activity, Assistive device use and maintanence  Physical Activity Discussed/Reviewed Physical Activity Discussed, Physical Activity Reviewed, Types of exercise, Home Exercise Program (HEP)  [Encouraged]  Education Interventions   Education Provided Provided Therapist, sports, Provided Web-based Education, Provided Education  Provided Verbal Education On Nutrition, Foot Care, Eye Care, Labs, Blood Sugar Monitoring, Mental Health/Coping with Illness, Applications, Exercise, Medication, When to see the doctor, [employer], Human resources officer, Personal Care Services  Mental Health Interventions   Mental Health Discussed/Reviewed Mental Health Discussed, Mental Health Reviewed, Coping Strategies, Crisis, Anxiety, Depression, Grief and Loss, Substance Abuse, Suicide  Nutrition Interventions   Nutrition Discussed/Reviewed Nutrition Discussed, Nutrition Reviewed, Adding fruits and vegetables, Fluid intake, Decreasing sugar intake, Increaing proteins, Decreasing fats, Decreasing salt  Pharmacy Interventions   Pharmacy Dicussed/Reviewed Pharmacy Topics Discussed, Pharmacy Topics Reviewed, Medication Adherence, Affording Medications, Referral to Pharmacist  Medication Adherence Unable to refill medication  Referral to Pharmacist Cannot afford medications  Safety Interventions   Safety Discussed/Reviewed Safety Discussed, Safety Reviewed, Fall Risk, Home Safety  Home Safety Assistive Devices, Need for home safety assessment, Refer for home visit, Contact provider for referral to PT/OT, Refer for community resources, Contact home health agency  Advanced Directive Interventions   Advanced Directives Discussed/Reviewed Advanced Directives Discussed, Advanced Directives Reviewed, Advanced Care Planning, Provided resource for acquiring and filling out documents     Active Listening & Reflection Utilized.  Emotional Support Provided. Verbalization of Feelings Encouraged.  Feelings  of Aggravation & Anxiety Acknowledged. Symptoms of Left Food Surgical Pain Validated. Caregiver Resources Reviewed. Solution-Focused Strategies Implemented. Problem Solving Interventions Employed. Task-Centered Solutions Activated.  Cognitive Behavioral Therapy Initiated.  Client-Centered Therapy Performed. Acceptance & Commitment Therapy Indicated. CSW Collaboration with Daughter, [name] to Confirm Patient's Return to [facility] [phone]), On 09/08/2022, After Being Hospitalized on 09/03/2022 for  Dehisced & Worsening Infection to Left Foot Surgical Wound.  CSW Collaboration with Daughter, [name] to Encourage Consideration of Long-Term Care Placement for Patient, Upon Discharge from Short-Term Rehab at [facility] [phone]).  CSW Collaboration with Daughter, [name] to Encourage Continued Completion of Aid & Attendance Benefits Application & Submission to [facility] [phone]), in An Effort to Obtain Care & Supervision for Patient in The Home. CSW Collaboration with Daughter, [name] to [employer] with Representative from [facility] [phone]), to Schedule Appointment to Receive Assistance with Completion & Submission of Aid & Attendance Benefits Application, if Necessary. CSW Collaboration with Daughter, [name] to [employer] with CSW [phone]), if She Has Questions, Needs Assistance, or If Additional Social Work Needs Are Identified Between Now & Our Next Scheduled Follow-Up Outreach Call.      Our next appointment is by telephone on 09/28/2022 at 12:45 pm.  Please call the care guide team at [phone] if you need to cancel or reschedule your appointment.   If you are experiencing a Mental Health or Behavioral Health Crisis or need someone to talk to, please call the  Suicide and Crisis Lifeline: 988 call the [place] National Suicide Prevention Lifeline: 918-194-9740 or TTY: (734)754-4649 TTY 867-591-4575) to talk to a trained counselor call 1-800-273-TALK (toll free, 24 hour hotline) go to [facility] [address], [place] [phone]) call the [facility] Crisis Line: [phone] call 911  Patient verbalizes understanding of instructions and care plan provided today and agrees to view in MyChart. Active MyChart status and patient understanding of how to access instructions and care plan via MyChart confirmed with patient.     Telephone follow up appointment with care management team member scheduled for:  09/28/2022 at 12:45 pm.  Danford Bad, BSW, MSW, LCSW  Licensed Clinical Social Worker  [employer] [facility]  Mailing [place] [place], [place] [address] Physical Address-[address], [place], [place] [address] Toll Free Main # [phone] Fax # [phone] Cell # [phone] [email or web address]

## 2022-09-15 ENCOUNTER — Ambulatory Visit: Payer: Medicare PPO | Admitting: Family Medicine

## 2022-09-16 ENCOUNTER — Encounter: Payer: Medicare PPO | Admitting: Pulmonary Disease

## 2022-09-16 DIAGNOSIS — Z992 Dependence on renal dialysis: Secondary | ICD-10-CM | POA: Diagnosis not present

## 2022-09-16 DIAGNOSIS — N2581 Secondary hyperparathyroidism of renal origin: Secondary | ICD-10-CM | POA: Diagnosis not present

## 2022-09-16 DIAGNOSIS — N186 End stage renal disease: Secondary | ICD-10-CM | POA: Diagnosis not present

## 2022-09-17 ENCOUNTER — Ambulatory Visit (INDEPENDENT_AMBULATORY_CARE_PROVIDER_SITE_OTHER): Payer: Medicare PPO | Admitting: Podiatry

## 2022-09-17 ENCOUNTER — Encounter: Payer: Medicare PPO | Admitting: Podiatry

## 2022-09-17 DIAGNOSIS — Z89432 Acquired absence of left foot: Secondary | ICD-10-CM

## 2022-09-17 DIAGNOSIS — D649 Anemia, unspecified: Secondary | ICD-10-CM | POA: Diagnosis not present

## 2022-09-17 NOTE — Progress Notes (Signed)
Subjective:  Patient ID: Kathryn Beck, female    DOB: 1955/10/06,  MRN: [id number]  Chief Complaint  Patient presents with   Routine Post Op    DOS: 09/04/22 Procedure: Left reviosnal TMA   67 y.o. female returns for post-op check.  Patient states she is doing well.  Nonweightbearing to the left lower extremity.  Nausea fever chills vomiting  Review of Systems: Negative except as noted in the HPI. Denies N/V/F/Ch.  Past Medical History:  Diagnosis Date   Acid reflux    Anemia    Arthritis    Axillary masses    Soft tissue - status post excision   Back pain    COVID-19 virus infection 04/06/2019   Depression    End-stage renal disease (HCC)    M/W/F dialysis   Essential hypertension    Headache    years ago   History of blood transfusion    History of cardiac catheterization    Normal coronary arteries October 2020   History of claustrophobia    History of pneumonia 2019   Hypoxia 04/03/2019   Memory loss    Mixed hyperlipidemia    Obesity    Pancreatitis    Peritoneal dialysis catheter in place [facility])    Pneumonia due to COVID-19 virus 04/02/2019   Sleep apnea    Noncompliant with CPAP   Stroke (HCC)    mini stroke   Type 2 diabetes mellitus (HCC)     Current Outpatient Medications:    aspirin 81 MG chewable tablet, CHEW TWO TABLETS BY MOUTH EVERY DAY (Patient taking differently: Chew 81 mg by mouth daily.), Disp: 56 tablet, Rfl: 11   azelastine (OPTIVAR) 0.05 % ophthalmic solution, Place 1 drop into both eyes 2 (two) times daily., Disp: 6 mL, Rfl: 2   B Complex-C-Folic Acid (RENA-VITE RX) 1 MG TABS, Take 1 tablet by mouth daily., Disp: , Rfl:    Blood Glucose Monitoring Suppl (ONETOUCH VERIO) w/Device KIT, Use to check blood sugar 4X daily., Disp: 1 kit, Rfl: 0   calcitRIOL (ROCALTROL) 0.25 MCG capsule, TAKE 1 CAPSULE (0.25 MCG TOTAL) BY MOUTH 2 (TWO) TIMES DAILY WITH A MEAL., Disp: 180 capsule, Rfl: 1   cinacalcet (SENSIPAR) 30 MG tablet, Take 3 tablets (90  mg total) by mouth 3 (three) times a week., Disp: 60 tablet, Rfl:    ciprofloxacin (CIPRO) 500 MG tablet, Take 1 tablet (500 mg total) by mouth at bedtime for 42 doses. Take after hemodialysis on HD days, Disp: 42 tablet, Rfl: 0   Continuous Blood Gluc Receiver (FREESTYLE LIBRE 2 READER) DEVI, 1 each by Does not apply route daily., Disp: 1 each, Rfl: 3   Continuous Blood Gluc Sensor (FREESTYLE LIBRE 2 SENSOR) MISC, 1 each by Does not apply route every 14 (fourteen) days., Disp: 6 each, Rfl: 3   doxycycline (VIBRAMYCIN) 100 MG capsule, Take 1 capsule (100 mg total) by mouth 2 (two) times daily., Disp: 84 capsule, Rfl: 0   DULoxetine (CYMBALTA) 60 MG capsule, TAKE (1) CAPSULE BY MOUTH ONCE DAILY. (Patient taking differently: Take 60 mg by mouth daily.), Disp: 28 capsule, Rfl: 11   ezetimibe (ZETIA) 10 MG tablet, Take 1 tablet (10 mg total) by mouth daily., Disp: 28 tablet, Rfl: 11   FIASP FLEXTOUCH 100 UNIT/ML FlexTouch Pen, Inject 10-20 Units into the skin See admin instructions. Inj 10-20 three times daily via sliding scale, Disp: , Rfl:    furosemide (LASIX) 80 MG tablet, Take 80 mg by mouth 2 (two)  times daily., Disp: , Rfl:    glucose blood (ONETOUCH VERIO) test strip, Use as instructed to check blood sugar 4X daily., Disp: 200 each, Rfl: 2   hydrOXYzine (ATARAX) 25 MG tablet, Take 25 mg by mouth every 12 (twelve) hours as needed for itching., Disp: , Rfl:    insulin glargine, 2 Unit Dial, (TOUJEO MAX SOLOSTAR) 300 UNIT/ML Solostar Pen, Inject 30 Units into the skin daily. May need to slowly increase the dose depending upon your blood sugar, follow-up with PCP, Disp: 1 mL, Rfl: 0   Insulin Pen Needle 32G X 4 MM MISC, Use 4x a day, Disp: 300 each, Rfl: 3   metoprolol succinate (TOPROL-XL) 50 MG 24 hr tablet, TAKE (1) TABLET BY MOUTH DAILY WITH FOOD *TAKE AFTER DIALYSIS* (Patient taking differently: Take 50 mg by mouth daily.), Disp: 28 tablet, Rfl: 11   Nutritional Supplements (,FEEDING SUPPLEMENT,  PROSOURCE PLUS) liquid, Take 30 mLs by mouth 2 (two) times daily between meals., Disp: 887 mL, Rfl: 0   OneTouch Delica Lancets 33G MISC, Use to check blood sugar 4X daily., Disp: 100 each, Rfl: 2   oxyCODONE-acetaminophen (PERCOCET/ROXICET) 5-325 MG tablet, Take 1 tablet by mouth every 4 (four) hours as needed., Disp: 15 tablet, Rfl: 0   pregabalin (LYRICA) 50 MG capsule, TAKE ONE CAPSULE BY MOUTH three times a day, Disp: 84 capsule, Rfl: 5   sertraline (ZOLOFT) 50 MG tablet, TAKE ONE TABLET BY MOUTH EVERY DAY, Disp: 28 tablet, Rfl: 0   [name] 500 MG chewable tablet, Chew 500-1,000 mg by mouth See admin instructions. Take 1000mg  (2 tablets) by mouth with meals and 500mg  (1 tablet) with snacks, Disp: , Rfl:  No current facility-administered medications for this visit.  Facility-Administered Medications Ordered in Other Visits:    0.9 %  sodium chloride infusion, , Intravenous, Continuous, Lockamy, Randi L, NP-C, Last Rate: 20 mL/hr at 05/29/22 0928, New Bag at 05/29/22 1013  Social History   Tobacco Use  Smoking Status Never   Passive exposure: Never  Smokeless Tobacco Never  Tobacco Comments   Verified by Daughter, Kathryn Beck    Allergies  Allergen Reactions   Ace Inhibitors Anaphylaxis and Swelling   Penicillins Itching, Swelling and Other (See Comments)    Did it involve swelling of the face/tongue/throat, SOB, or low BP? Unknown Did it involve sudden or severe rash/hives, skin peeling, or any reaction on the inside of your mouth or nose? Unknown Did you need to seek medical attention at a hospital or doctor's office? Unknown When did it last happen?      years  If all above answers are "NO", may proceed with cephalosporin use.    Statins Other (See Comments)    elevated LFT's     Albuterol Swelling   Objective:  There were no vitals filed for this visit. There is no height or weight on file to calculate BMI. Constitutional Well developed. Well nourished.  Vascular  Foot warm and well perfused. Capillary refill normal to all digits.   Neurologic Normal speech. Oriented to person, place, and time. Epicritic sensation to light touch grossly present bilaterally.  Dermatologic Skin healing well without signs of infection. Skin edges well coapted without signs of infection.  Orthopedic: Tenderness to palpation noted about the surgical site.   Radiographs: NOne Assessment:   1. S/P transmetatarsal amputation of foot, left (HCC)    Plan:  Patient was evaluated and treated and all questions answered.  S/p foot surgery left -Progressing as expected post-operatively. -  XR: See above -WB Status: Nonweightbearing in left lower extremity -Sutures: Intact.  No clinical signs of dehiscence.  No complication. -Medications: None -Foot redressed.  Return in about 2 weeks (around 10/01/2022) for [place] .

## 2022-09-18 DIAGNOSIS — Z992 Dependence on renal dialysis: Secondary | ICD-10-CM | POA: Diagnosis not present

## 2022-09-18 DIAGNOSIS — N186 End stage renal disease: Secondary | ICD-10-CM | POA: Diagnosis not present

## 2022-09-18 DIAGNOSIS — N2581 Secondary hyperparathyroidism of renal origin: Secondary | ICD-10-CM | POA: Diagnosis not present

## 2022-09-19 ENCOUNTER — Encounter: Payer: Medicare PPO | Admitting: Pulmonary Disease

## 2022-09-20 DIAGNOSIS — N186 End stage renal disease: Secondary | ICD-10-CM | POA: Diagnosis not present

## 2022-09-20 DIAGNOSIS — Z992 Dependence on renal dialysis: Secondary | ICD-10-CM | POA: Diagnosis not present

## 2022-09-21 DIAGNOSIS — N2581 Secondary hyperparathyroidism of renal origin: Secondary | ICD-10-CM | POA: Diagnosis not present

## 2022-09-21 DIAGNOSIS — Z992 Dependence on renal dialysis: Secondary | ICD-10-CM | POA: Diagnosis not present

## 2022-09-21 DIAGNOSIS — N186 End stage renal disease: Secondary | ICD-10-CM | POA: Diagnosis not present

## 2022-09-22 ENCOUNTER — Inpatient Hospital Stay: Payer: Medicare PPO | Attending: Family Medicine

## 2022-09-22 ENCOUNTER — Other Ambulatory Visit: Payer: Self-pay | Admitting: Family Medicine

## 2022-09-22 DIAGNOSIS — D649 Anemia, unspecified: Secondary | ICD-10-CM | POA: Insufficient documentation

## 2022-09-23 DIAGNOSIS — N2581 Secondary hyperparathyroidism of renal origin: Secondary | ICD-10-CM | POA: Diagnosis not present

## 2022-09-23 DIAGNOSIS — Z992 Dependence on renal dialysis: Secondary | ICD-10-CM | POA: Diagnosis not present

## 2022-09-23 DIAGNOSIS — N186 End stage renal disease: Secondary | ICD-10-CM | POA: Diagnosis not present

## 2022-09-25 ENCOUNTER — Emergency Department (HOSPITAL_COMMUNITY)
Admission: EM | Admit: 2022-09-25 | Discharge: 2022-09-25 | Disposition: A | Discharge status: Home / Self Care | Payer: Medicare PPO | Attending: Emergency Medicine | Admitting: Emergency Medicine

## 2022-09-25 ENCOUNTER — Emergency Department (HOSPITAL_COMMUNITY): Payer: Medicare PPO

## 2022-09-25 ENCOUNTER — Other Ambulatory Visit: Payer: Self-pay

## 2022-09-25 DIAGNOSIS — T8149XA Infection following a procedure, other surgical site, initial encounter: Secondary | ICD-10-CM | POA: Diagnosis not present

## 2022-09-25 DIAGNOSIS — Z992 Dependence on renal dialysis: Secondary | ICD-10-CM | POA: Diagnosis not present

## 2022-09-25 DIAGNOSIS — N186 End stage renal disease: Secondary | ICD-10-CM | POA: Diagnosis not present

## 2022-09-25 DIAGNOSIS — Z794 Long term (current) use of insulin: Secondary | ICD-10-CM | POA: Diagnosis not present

## 2022-09-25 DIAGNOSIS — L089 Local infection of the skin and subcutaneous tissue, unspecified: Secondary | ICD-10-CM | POA: Diagnosis not present

## 2022-09-25 DIAGNOSIS — I120 Hypertensive chronic kidney disease with stage 5 chronic kidney disease or end stage renal disease: Secondary | ICD-10-CM | POA: Insufficient documentation

## 2022-09-25 DIAGNOSIS — Z7982 Long term (current) use of aspirin: Secondary | ICD-10-CM | POA: Diagnosis not present

## 2022-09-25 DIAGNOSIS — N2581 Secondary hyperparathyroidism of renal origin: Secondary | ICD-10-CM | POA: Diagnosis not present

## 2022-09-25 DIAGNOSIS — Z48817 Encounter for surgical aftercare following surgery on the skin and subcutaneous tissue: Secondary | ICD-10-CM | POA: Insufficient documentation

## 2022-09-25 DIAGNOSIS — Z79899 Other long term (current) drug therapy: Secondary | ICD-10-CM | POA: Diagnosis not present

## 2022-09-25 DIAGNOSIS — Z89422 Acquired absence of other left toe(s): Secondary | ICD-10-CM | POA: Diagnosis not present

## 2022-09-25 DIAGNOSIS — T8140XA Infection following a procedure, unspecified, initial encounter: Secondary | ICD-10-CM

## 2022-09-25 LAB — COMPREHENSIVE METABOLIC PANEL
ALT: 26 U/L (ref 0–44)
AST: 27 U/L (ref 15–41)
Albumin: 3.3 g/dL — ABNORMAL LOW (ref 3.5–5.0)
Alkaline Phosphatase: 86 U/L (ref 38–126)
Anion gap: 13 (ref 5–15)
BUN: 31 mg/dL — ABNORMAL HIGH (ref 8–23)
CO2: 24 mmol/L (ref 22–32)
Calcium: 8.7 mg/dL — ABNORMAL LOW (ref 8.9–10.3)
Chloride: 102 mmol/L (ref 98–111)
Creatinine, Ser: 3.9 mg/dL — ABNORMAL HIGH (ref 0.44–1.00)
GFR, Estimated: 12 mL/min — ABNORMAL LOW (ref >60)
Glucose, Bld: 138 mg/dL — ABNORMAL HIGH (ref 70–99)
Potassium: 4.1 mmol/L (ref 3.5–5.1)
Sodium: 139 mmol/L (ref 135–145)
Total Bilirubin: 0.5 mg/dL (ref 0.3–1.2)
Total Protein: 6.7 g/dL (ref 6.5–8.1)

## 2022-09-25 LAB — CBC WITH DIFFERENTIAL/PLATELET
Abs Immature Granulocytes: 0.02 10*3/uL (ref 0.00–0.07)
Basophils Absolute: 0 10*3/uL (ref 0.0–0.1)
Basophils Relative: 1 %
Eosinophils Absolute: 0.1 10*3/uL (ref 0.0–0.5)
Eosinophils Relative: 2 %
HCT: 31.4 % — ABNORMAL LOW (ref 36.0–46.0)
Hemoglobin: 9.5 g/dL — ABNORMAL LOW (ref 12.0–15.0)
Immature Granulocytes: 0 %
Lymphocytes Relative: 25 %
Lymphs Abs: 1.5 10*3/uL (ref 0.7–4.0)
MCH: 25.4 pg — ABNORMAL LOW (ref 26.0–34.0)
MCHC: 30.3 g/dL (ref 30.0–36.0)
MCV: 84 fL (ref 80.0–100.0)
Monocytes Absolute: 0.6 10*3/uL (ref 0.1–1.0)
Monocytes Relative: 10 %
Neutro Abs: 3.9 10*3/uL (ref 1.7–7.7)
Neutrophils Relative %: 62 %
Platelets: 192 10*3/uL (ref 150–400)
RBC: 3.74 MIL/uL — ABNORMAL LOW (ref 3.87–5.11)
RDW: 20 % — ABNORMAL HIGH (ref 11.5–15.5)
WBC: 6.2 10*3/uL (ref 4.0–10.5)
nRBC: 0 % (ref 0.0–0.2)

## 2022-09-25 NOTE — ED Provider Notes (Signed)
[place] EMERGENCY DEPARTMENT AT [facility] Provider Note   CSN: [id number] Arrival date & time: 09/25/22  1500     History Chief Complaint  Patient presents with   Wound Infection    Kathryn Beck is a 67 y.o. female.  Patient with past medical history significant for end-stage renal disease on hemodialysis, hypertension, peripheral vascular disease who presents emergency department complaints of drainage from surgical site.  Patient had revision of transmetatarsal amputation with irrigation and debridement on 09/04/2022 with Dr. Ralene Cork.  Most recent follow-up with Dr. Allena Katz on 09/17/2022.  Patient denies weightbearing on the left lower extremity.  Reports that she has dressing changes daily at [facility] facility.  Currently denies nausea, vomiting, fever, chills.  No evidence of systemic infection.  Patient currently on doxycycline and ciprofloxacin.  HPI     Home Medications Prior to Admission medications   Medication Sig Start Date End Date Taking? Authorizing Provider  aspirin 81 MG chewable tablet CHEW TWO TABLETS BY MOUTH EVERY DAY Patient taking differently: Chew 81 mg by mouth daily. 07/24/22   [name], MD  azelastine (OPTIVAR) 0.05 % ophthalmic solution Place 1 drop into both eyes 2 (two) times daily. 05/25/22   [name], MD  B Complex-C-Folic Acid (RENA-VITE RX) 1 MG TABS Take 1 tablet by mouth daily. 04/15/22   [provider]  Blood Glucose Monitoring Suppl (ONETOUCH VERIO) w/Device KIT Use to check blood sugar 4X daily. 04/16/22   Carlus Pavlov, MD  calcitRIOL (ROCALTROL) 0.25 MCG capsule TAKE 1 CAPSULE (0.25 MCG TOTAL) BY MOUTH 2 (TWO) TIMES DAILY WITH A MEAL. 06/05/22   Roma Kayser, MD  cinacalcet (SENSIPAR) 30 MG tablet Take 3 tablets (90 mg total) by mouth 3 (three) times a week. 09/09/22   Marguerita Merles Latif, DO  ciprofloxacin (CIPRO) 500 MG tablet Take 1 tablet (500 mg total) by mouth at bedtime for 42 doses. Take after  hemodialysis on HD days 09/08/22 10/20/22  Marguerita Merles Latif, DO  Continuous Blood Gluc Receiver (FREESTYLE LIBRE 2 READER) DEVI 1 each by Does not apply route daily. 04/16/22   Carlus Pavlov, MD  Continuous Blood Gluc Sensor (FREESTYLE LIBRE 2 SENSOR) MISC 1 each by Does not apply route every 14 (fourteen) days. 04/16/22   Carlus Pavlov, MD  doxycycline (VIBRAMYCIN) 100 MG capsule Take 1 capsule (100 mg total) by mouth 2 (two) times daily. 09/08/22 10/20/22  Marguerita Merles Latif, DO  DULoxetine (CYMBALTA) 60 MG capsule TAKE (1) CAPSULE BY MOUTH ONCE DAILY. Patient taking differently: Take 60 mg by mouth daily. 09/17/20   [name], MD  ezetimibe (ZETIA) 10 MG tablet Take 1 tablet (10 mg total) by mouth daily. 05/25/22   [name], MD  FIASP FLEXTOUCH 100 UNIT/ML FlexTouch Pen Inject 10-20 Units into the skin See admin instructions. Inj 10-20 three times daily via sliding scale 08/06/22   [provider]  furosemide (LASIX) 80 MG tablet Take 80 mg by mouth 2 (two) times daily. 05/14/22   [provider]  glucose blood (ONETOUCH VERIO) test strip Use as instructed to check blood sugar 4X daily. 04/16/22   Carlus Pavlov, MD  hydrOXYzine (ATARAX) 25 MG tablet Take 25 mg by mouth every 12 (twelve) hours as needed for itching.    [provider]  insulin glargine, 2 Unit Dial, (TOUJEO MAX SOLOSTAR) 300 UNIT/ML Solostar Pen Inject 30 Units into the skin daily. May need to slowly increase the dose depending upon your  blood sugar, follow-up with PCP 07/22/22   Lanae Boast, MD  Insulin Pen Needle 32G X 4 MM MISC Use 4x a day 05/25/22   Carlus Pavlov, MD  metoprolol succinate (TOPROL-XL) 50 MG 24 hr tablet TAKE (1) TABLET BY MOUTH DAILY WITH FOOD *TAKE AFTER DIALYSIS* Patient taking differently: Take 50 mg by mouth daily. 07/29/21   [name], MD  Nutritional Supplements (,FEEDING SUPPLEMENT, PROSOURCE PLUS) liquid Take 30 mLs by mouth 2 (two) times daily  between meals. 09/08/22   Marguerita Merles Latif, DO  OneTouch Delica Lancets 33G MISC Use to check blood sugar 4X daily. 04/16/22   Carlus Pavlov, MD  oxyCODONE-acetaminophen (PERCOCET/ROXICET) 5-325 MG tablet Take 1 tablet by mouth every 4 (four) hours as needed. 09/08/22   Marguerita Merles Latif, DO  pregabalin (LYRICA) 50 MG capsule TAKE ONE CAPSULE BY MOUTH three times a day 08/24/22   Vickki Hearing, MD  sertraline (ZOLOFT) 50 MG tablet TAKE ONE TABLET BY MOUTH EVERY DAY 09/22/22   [name], MD  torsemide (DEMADEX) 100 MG tablet TAKE 1 TABLET BY MOUTH TWICE DAILY *HOLD MORNING DOSE ON DIALYSIS DAYS* 09/22/22   [name], MD  [name] 500 MG chewable tablet Chew 500-1,000 mg by mouth See admin instructions. Take 1000mg  (2 tablets) by mouth with meals and 500mg  (1 tablet) with snacks 04/15/22   [provider]  FLUoxetine (PROZAC) 10 MG capsule Take 10 mg by mouth daily.    05/28/11  [provider]  glipiZIDE (GLUCOTROL) 10 MG tablet Take 10 mg by mouth 2 (two) times daily before a meal.    05/28/11  [provider]      Allergies    Ace inhibitors, Penicillins, Statins, and Albuterol    Review of Systems   Review of Systems  Skin:  Positive for wound.  All other systems reviewed and are negative.   Physical Exam Updated Vital Signs BP (!) 160/67 (BP Location: Right Arm)   Pulse 64   Temp 97.9 F (36.6 C)   Resp (!) 8   Ht 5\' 4"  (1.626 m)   Wt 79 kg   SpO2 97%   BMI 29.90 kg/m  Physical Exam Vitals and nursing note reviewed.  Constitutional:      General: She is not in acute distress.    Appearance: She is well-developed.  HENT:     Head: Normocephalic and atraumatic.  Eyes:     Conjunctiva/sclera: Conjunctivae normal.  Cardiovascular:     Rate and Rhythm: Normal rate and regular rhythm.     Heart sounds: No murmur heard. Pulmonary:     Effort: Pulmonary effort is normal. No respiratory distress.     Breath sounds: Normal breath  sounds.  Abdominal:     Palpations: Abdomen is soft.     Tenderness: There is no abdominal tenderness.  Musculoskeletal:        General: No swelling.     Cervical back: Neck supple.  Skin:    General: Skin is warm and dry.     Capillary Refill: Capillary refill takes less than 2 seconds.     Findings: Lesion present.     Comments: 8cm surgical site noted with probably wound dehiscence as staples no longer holding wound edge together.  Neurological:     Mental Status: She is alert.  Psychiatric:        Mood and Affect: Mood normal.     ED Results / Procedures / Treatments   Labs (all  labs ordered are listed, but only abnormal results are displayed) Labs Reviewed  COMPREHENSIVE METABOLIC PANEL - Abnormal; Notable for the following components:      Result Value   Glucose, Bld 138 (*)    BUN 31 (*)    Creatinine, Ser 3.90 (*)    Calcium 8.7 (*)    Albumin 3.3 (*)    GFR, Estimated 12 (*)    All other components within normal limits  CBC WITH DIFFERENTIAL/PLATELET - Abnormal; Notable for the following components:   RBC 3.74 (*)    Hemoglobin 9.5 (*)    HCT 31.4 (*)    MCH 25.4 (*)    RDW 20.0 (*)    All other components within normal limits    EKG None  Radiology DG Foot Complete Left  Result Date: 09/25/2022 CLINICAL DATA:  Wound infection. EXAM: LEFT FOOT - COMPLETE 3+ VIEW COMPARISON:  Radiograph 09/05/2022 FINDINGS: Previous transmetatarsal amputation of all 5 rays. Resection margins of the first, second and fifth metatarsals are indistinct. Overlying skin staples remain in place. Small amount of air is seen in the operative bed. No evidence of acute fracture. Extensive vascular calcifications. IMPRESSION: 1. Previous transmetatarsal amputation of all 5 rays. Resection margins of the first, second and fifth metatarsals are indistinct, suspicious for osteomyelitis. 2. Soft tissue gas in the operative bed, nonspecific, but not typically seen greater than 2 weeks out from  surgery. Overlying skin staples remain in place. Electronically Signed   By: Narda Rutherford M.D.   On: 09/25/2022 17:09    Procedures Procedures   Medications Ordered in ED Medications - No data to display  ED Course/ Medical Decision Making/ A&P                           Medical Decision Making Amount and/or Complexity of Data Reviewed Labs: ordered. Radiology: ordered.   This patient presents to the ED for concern of wound infection.  Differential diagnosis includes poor wound healing, wound dehiscence, cellulitis, sepsis   Lab Tests:  I Ordered, and personally interpreted labs.  The pertinent results include: CBC unremarkable with baseline anemia noted but no elevation in white blood count.  CMP as expected with dialysis session performed earlier today.   Imaging Studies ordered:  I ordered imaging studies including x-ray of left foot I independently visualized and interpreted imaging which showed indistinct resection margins of the first, second, and fifth digits concerning for osteomyelitis. I agree with the radiologist interpretation   Problem List / ED Course:  Patient presented to the emergency department with concerns of poor wound healing.  Concerned that the wound has been draining over the last 2 days and has been worsening.  On 09/04/2022 had revision of transmetatarsal amputation with irrigation and debridement by Dr. Ralene Cork.  Patient had follow-up visit on 09/17/2022 with Dr. Allena Katz.  Denies any weightbearing on this foot.  Reports that wound care has been changing dressings daily.  Currently on ciprofloxacin and doxycycline.  Concern for mild wound dehiscence.  Will consult with podiatry for further recommendations on management of patient's condition.  Page to podiatry put out. Spoke with Dr. Ralene Cork who agrees with current plan but advised adding on xray of left foot. Also advised some basic wound care and removal of staples in foot as they do not appear to be  holding wound together at this point. Imaging and lab is resulted back with evidence of no significant elevation in white blood  count.  X-ray imaging appears to also be largely unchanged from prior x-ray imaging.  Secure chatted with Dr. Ralene Cork, D.P.M., with regards to results of labs and imaging.  She feels that patient is still safe for outpatient follow-up with continuation of antibiotic therapy at home.  Informed patient of these recommendations and patient is agreeable plan.  Advised patient to follow-up with podiatry.  Patient is agreeable to treatment plan and verbalized understanding all return precautions.  All questions answered prior to patient discharge.  Final Clinical Impression(s) / ED Diagnoses Final diagnoses:  Postoperative infection, unspecified type, initial encounter    Rx / DC Orders ED Discharge Orders     None         Smitty Knudsen, PA-C 09/25/22 1840    Rexford Maus, DO 09/25/22 2227

## 2022-09-25 NOTE — ED Notes (Signed)
Report attempted to be called, no answer will attempts again

## 2022-09-25 NOTE — ED Triage Notes (Signed)
Pt BIB EMS from Rehab facility in [place].  Pt has continued drainage from post op site on left foot. No pain. No fever.  VSS

## 2022-09-25 NOTE — Telephone Encounter (Signed)
Ladona Ridgel called from a facility regarding patient stating her wound was dehisced and sutures were no longer in place. Ladona Ridgel is calling patients daughter and they are going to send her to the hospital as she is already on oral antibiotics.

## 2022-09-25 NOTE — Discharge Instructions (Addendum)
You are seen in the emergency department for concerns of infection in the surgical site of your left foot.  Your labs and imaging were largely unremarkable without any significant changes or signs of infection concerning enough for admission.  I spoke with Dr. Ralene Cork, who recommends following up outpatient with the podiatry group.  If you begin to develop any fevers, fatigue, confusion, please return to the emergency department.

## 2022-09-25 NOTE — ED Notes (Signed)
Unable to give report to [facility] and nursing. RN called twice and no answer.

## 2022-09-28 ENCOUNTER — Ambulatory Visit: Payer: Self-pay | Admitting: *Deleted

## 2022-09-28 ENCOUNTER — Encounter: Payer: Self-pay | Admitting: *Deleted

## 2022-09-28 DIAGNOSIS — N186 End stage renal disease: Secondary | ICD-10-CM | POA: Diagnosis not present

## 2022-09-28 DIAGNOSIS — Z992 Dependence on renal dialysis: Secondary | ICD-10-CM | POA: Diagnosis not present

## 2022-09-28 DIAGNOSIS — N2581 Secondary hyperparathyroidism of renal origin: Secondary | ICD-10-CM | POA: Diagnosis not present

## 2022-09-28 NOTE — Patient Instructions (Signed)
Visit Information  Thank you for taking time to visit with me today. Please don't hesitate to contact me if I can be of assistance to you.   Following are the goals we discussed today:   Goals Addressed             This Visit's Progress    Receive Assistance Applying for Aid & Attendance Benefits, through [employer].   On track    Care Coordination Interventions:  Interventions Today    Flowsheet Row Most Recent Value  Chronic Disease   Chronic disease during today's visit Diabetes, Hypertension (HTN), Chronic Kidney Disease/End Stage Renal Disease (ESRD), Congestive Heart Failure (CHF), Other  [Neuropathy, Chronic Pain Syndrome, Anxiety, Depression, Memory Loss, Neurocognitive Disorder & Requires Maximum Assistance with Activities of Daily Living]  General Interventions   General Interventions Discussed/Reviewed General Interventions Reviewed, Annual Eye Exam, General Interventions Discussed, Labs, Annual Foot Exam, Lipid Profile, Durable Medical Equipment (DME), Vaccines, Health Screening, [employer], Doctor Visits, Communication with, Level of Care  [Primary Care Provider]  Labs Hgb A1c every 3 months, Kidney Function  Vaccines COVID-19, Flu, Pneumonia, RSV, Shingles, Tetanus/Pertussis/Diphtheria  [Encouraged]  Doctor Visits Discussed/Reviewed Doctor Visits Discussed, Doctor Visits Reviewed, Annual Wellness Visits, PCP, Specialist  [Encouraged]  Health Screening Colonoscopy, Mammogram  [Encouraged]  Durable Medical Equipment (DME) BP Cuff, Glucomoter, Environmental consultant, Wheelchair, Physicist, medical  PCP/Specialist Visits Compliance with follow-up visit  Communication with PCP/Specialists, RN  Level of Care Adult Daycare, Air traffic controller, Assisted Living, Skilled Nursing Facility, Teaching laboratory technician Medicaid, Personal Care Services  Exercise Interventions   Exercise Discussed/Reviewed Exercise Discussed, Exercise Reviewed, Physical  Activity, Assistive device use and maintanence  Physical Activity Discussed/Reviewed Physical Activity Discussed, Physical Activity Reviewed, Types of exercise, Home Exercise Program (HEP)  [Encouraged]  Education Interventions   Education Provided Provided Therapist, sports, Provided Web-based Education, Provided Education  Provided Verbal Education On Nutrition, Foot Care, Eye Care, Labs, Blood Sugar Monitoring, Mental Health/Coping with Illness, Applications, Exercise, Medication, When to see the doctor, [employer], Human resources officer, Personal Care Services  Mental Health Interventions   Mental Health Discussed/Reviewed Mental Health Discussed, Mental Health Reviewed, Coping Strategies, Crisis, Anxiety, Depression, Grief and Loss, Substance Abuse, Suicide  Nutrition Interventions   Nutrition Discussed/Reviewed Nutrition Discussed, Nutrition Reviewed, Adding fruits and vegetables, Fluid intake, Decreasing sugar intake, Increaing proteins, Decreasing fats, Decreasing salt  Pharmacy Interventions   Pharmacy Dicussed/Reviewed Pharmacy Topics Discussed, Pharmacy Topics Reviewed, Medication Adherence, Affording Medications, Referral to Pharmacist  Medication Adherence Unable to refill medication  Referral to Pharmacist Cannot afford medications  Safety Interventions   Safety Discussed/Reviewed Safety Discussed, Safety Reviewed, Fall Risk, Home Safety  Home Safety Assistive Devices, Need for home safety assessment, Refer for home visit, Contact provider for referral to PT/OT, Refer for community resources, Contact home health agency  Advanced Directive Interventions   Advanced Directives Discussed/Reviewed Advanced Directives Discussed, Advanced Directives Reviewed, Advanced Care Planning, Provided resource for acquiring and filling out documents     Active Listening & Reflection Utilized.  Emotional Support Provided. Verbalization of Feelings Encouraged.  Feelings  of Frustration & Setbacks Acknowledged. Symptoms of Surgical Pain Validated. Solution-Focused Strategies Revised. Problem Solving Interventions Implemented. Task-Centered Solutions Employed.  Cognitive Behavioral Therapy Performed.  Client-Centered Therapy Performed Initiated. Acceptance & Commitment Therapy Employed. CSW Collaboration with Daughter, [name] to Confirm Patient's Return to [facility] [phone]), On 09/25/2022, After Presenting to The Emergency Department at [facility] (# [phone]) for Infected, Nonhealing Revision of Transmetatarsal Amputation with Irrigation & Debridement. CSW Collaboration with Daughter, [name] to Control and instrumentation engineer with Therapy Department at [facility] (# [phone]), to Inquire About Insurance Appeal Process & Continued Length of Stay. CSW Collaboration with Daughter, [name] to Have Extensive Discussion Regarding Long-Term Care Placement for Patient, Encouraging Serious Thought & Consideration, Either at [facility] [phone]), or Another Long-Term Care Facility of Choice.  CSW Collaboration with Daughter, [name] to Encourage Continued Completion of Aid & Attendance Benefits Application & Submission to [facility] [phone]) for Processing, in An Effort to Obtain Care & Supervision for Patient, if The Plan is to Return Home.  CSW Collaboration with Daughter, [name] to [employer] with Representative from [facility] [phone]), to Schedule Appointment to Receive Assistance with Completion & Submission of Aid & Attendance Benefits Application, if Necessary. CSW Collaboration with Daughter, [name] to [employer] with CSW [phone]), if She Has Questions, Needs  Assistance, or If Additional Social Work Needs Are Identified Between Now & Our Next Scheduled Follow-Up Outreach Call.      Our next appointment is by telephone on 10/12/2022 at 9:45 am.  Please call the care guide team at [phone] if you need to cancel or reschedule your appointment.   If you are experiencing a Mental Health or Behavioral Health Crisis or need someone to talk to, please call the Suicide and Crisis Lifeline: 988 call the [place] National Suicide Prevention Lifeline: 714-341-8183 or TTY: 843-652-4138 TTY 918-108-4472) to talk to a trained counselor call 1-800-273-TALK (toll free, 24 hour hotline) go to [facility] [address], [place] [phone]) call the [facility] Crisis Line: [phone] call 911  Patient verbalizes understanding of instructions and care plan provided today and agrees to view in MyChart. Active MyChart status and patient understanding of how to access instructions and care plan via MyChart confirmed with patient.     Telephone follow up appointment with care management team member scheduled for:  10/12/2022 at 9:45 am.  Danford Bad, BSW, MSW, LCSW  Licensed Clinical Social Worker  [facility]  Mailing [place]. [address] [place], [place] [address] Physical [address] [place], [place] [address] Toll Free Main # [phone] Fax # [phone] Cell # [phone] [email or web address]

## 2022-09-28 NOTE — Telephone Encounter (Signed)
Pt seen in ED for dehiscence of  transmet amputation, s/p 09/08/22, surgery with podiatry. She went to ED on 10/05/22. Will follow up with facility to see if she will be seen sooner by podiatry after d/c from ER.

## 2022-09-28 NOTE — Patient Outreach (Signed)
Care Coordination   Follow Up Visit Note   09/28/2022  Name: Kathryn Beck MRN: [id number] DOB: 1955-04-20  Kathryn Beck is a 67 y.o. year old female who sees [name], MD for primary care. I spoke with patient's daughter, [name] by phone today.  What matters to the patients health and wellness today?  Receive Assistance Applying for Aid & Attendance Benefits, through [employer].   Goals Addressed             This Visit's Progress    Receive Assistance Applying for Aid & Attendance Benefits, through [employer].   On track    Care Coordination Interventions:  Interventions Today    Flowsheet Row Most Recent Value  Chronic Disease   Chronic disease during today's visit Diabetes, Hypertension (HTN), Chronic Kidney Disease/End Stage Renal Disease (ESRD), Congestive Heart Failure (CHF), Other  [Neuropathy, Chronic Pain Syndrome, Anxiety, Depression, Memory Loss, Neurocognitive Disorder & Requires Maximum Assistance with Activities of Daily Living]  General Interventions   General Interventions Discussed/Reviewed General Interventions Reviewed, Annual Eye Exam, General Interventions Discussed, Labs, Annual Foot Exam, Lipid Profile, Durable Medical Equipment (DME), Vaccines, Health Screening, [employer], Doctor Visits, Communication with, Level of Care  [Primary Care Provider]  Labs Hgb A1c every 3 months, Kidney Function  Vaccines COVID-19, Flu, Pneumonia, RSV, Shingles, Tetanus/Pertussis/Diphtheria  [Encouraged]  Doctor Visits Discussed/Reviewed Doctor Visits Discussed, Doctor Visits Reviewed, Annual Wellness Visits, PCP, Specialist  [Encouraged]  Health Screening Colonoscopy, Mammogram  [Encouraged]  Durable Medical Equipment (DME) BP Cuff, Glucomoter, Environmental consultant, Wheelchair, Physicist, medical  PCP/Specialist Visits Compliance with follow-up visit  Communication with PCP/Specialists, RN  Level of Care Adult  Daycare, Air traffic controller, Assisted Living, Skilled Nursing Facility, Teaching laboratory technician Medicaid, Personal Care Services  Exercise Interventions   Exercise Discussed/Reviewed Exercise Discussed, Exercise Reviewed, Physical Activity, Assistive device use and maintanence  Physical Activity Discussed/Reviewed Physical Activity Discussed, Physical Activity Reviewed, Types of exercise, Home Exercise Program (HEP)  [Encouraged]  Education Interventions   Education Provided Provided Therapist, sports, Provided Web-based Education, Provided Education  Provided Verbal Education On Nutrition, Foot Care, Eye Care, Labs, Blood Sugar Monitoring, Mental Health/Coping with Illness, Applications, Exercise, Medication, When to see the doctor, [employer], Human resources officer, Personal Care Services  Mental Health Interventions   Mental Health Discussed/Reviewed Mental Health Discussed, Mental Health Reviewed, Coping Strategies, Crisis, Anxiety, Depression, Grief and Loss, Substance Abuse, Suicide  Nutrition Interventions   Nutrition Discussed/Reviewed Nutrition Discussed, Nutrition Reviewed, Adding fruits and vegetables, Fluid intake, Decreasing sugar intake, Increaing proteins, Decreasing fats, Decreasing salt  Pharmacy Interventions   Pharmacy Dicussed/Reviewed Pharmacy Topics Discussed, Pharmacy Topics Reviewed, Medication Adherence, Affording Medications, Referral to Pharmacist  Medication Adherence Unable to refill medication  Referral to Pharmacist Cannot afford medications  Safety Interventions   Safety Discussed/Reviewed Safety Discussed, Safety Reviewed, Fall Risk, Home Safety  Home Safety Assistive Devices, Need for home safety assessment, Refer for home visit, Contact provider for referral to PT/OT, Refer for community resources, Contact home health agency  Advanced Directive Interventions   Advanced Directives Discussed/Reviewed Advanced Directives Discussed,  Advanced Directives Reviewed, Advanced Care Planning, Provided resource for acquiring and filling out documents     Active Listening & Reflection Utilized.  Emotional Support Provided. Verbalization of Feelings Encouraged.  Feelings of Frustration & Setbacks Acknowledged. Symptoms of Surgical Pain Validated. Solution-Focused Strategies Revised. Problem Solving Interventions Implemented. Task-Centered Solutions Employed.  Cognitive Behavioral Therapy Performed.  Client-Centered Therapy Performed Initiated. Acceptance &  Commitment Therapy Employed. CSW Collaboration with Daughter, [name] to Confirm Patient's Return to [facility] [phone]), On 09/25/2022, After Presenting to The Emergency Department at [facility] (# [phone]) for Infected, Nonhealing Revision of Transmetatarsal Amputation with Irrigation & Debridement. CSW Collaboration with Daughter, [name] to Control and instrumentation engineer with Therapy Department at [facility] (# [phone]), to Inquire About Insurance Appeal Process & Continued Length of Stay. CSW Collaboration with Daughter, [name] to Have Extensive Discussion Regarding Long-Term Care Placement for Patient, Encouraging Serious Thought & Consideration, Either at [facility] [phone]), or Another Long-Term Care Facility of Choice.  CSW Collaboration with Daughter, [name] to Encourage Continued Completion of Aid & Attendance Benefits Application & Submission to [facility] [phone]) for Processing, in An Effort to Obtain Care & Supervision for Patient, if The Plan is to Return Home.  CSW Collaboration with Daughter, [name] to [employer] with Representative from [facility] [phone]), to Schedule Appointment to Receive  Assistance with Completion & Submission of Aid & Attendance Benefits Application, if Necessary. CSW Collaboration with Daughter, [name] to [employer] with CSW [phone]), if She Has Questions, Needs Assistance, or If Additional Social Work Needs Are Identified Between Now & Our Next Scheduled Follow-Up Outreach Call.      SDOH assessments and interventions completed:  Yes.  Care Coordination Interventions:  Yes, provided.   Follow up plan: Follow up call scheduled for 10/12/2022 at 9:45 am.  Encounter Outcome:  Pt. Visit Completed.   Danford Bad, BSW, MSW, LCSW  Licensed Restaurant manager, [employer] [facility]  Mailing [place] [address] [place], [place] [address] Physical Address-[address] [place], [place] [address] Toll Free Main # [phone] Fax # [phone] Cell # [phone] [email or web address]

## 2022-09-29 ENCOUNTER — Inpatient Hospital Stay: Payer: Medicare PPO | Admitting: Physician Assistant

## 2022-09-29 ENCOUNTER — Inpatient Hospital Stay: Payer: Medicare PPO

## 2022-09-29 ENCOUNTER — Encounter: Payer: Medicare PPO | Admitting: Physical Therapy

## 2022-09-29 DIAGNOSIS — D472 Monoclonal gammopathy: Secondary | ICD-10-CM

## 2022-09-29 DIAGNOSIS — D638 Anemia in other chronic diseases classified elsewhere: Secondary | ICD-10-CM

## 2022-09-29 DIAGNOSIS — D649 Anemia, unspecified: Secondary | ICD-10-CM | POA: Diagnosis not present

## 2022-09-29 LAB — CBC WITH DIFFERENTIAL/PLATELET
Abs Immature Granulocytes: 0.02 10*3/uL (ref 0.00–0.07)
Basophils Absolute: 0 10*3/uL (ref 0.0–0.1)
Basophils Relative: 0 %
Eosinophils Absolute: 0.1 10*3/uL (ref 0.0–0.5)
Eosinophils Relative: 1 %
HCT: 31.2 % — ABNORMAL LOW (ref 36.0–46.0)
Hemoglobin: 9.5 g/dL — ABNORMAL LOW (ref 12.0–15.0)
Immature Granulocytes: 0 %
Lymphocytes Relative: 27 %
Lymphs Abs: 1.5 10*3/uL (ref 0.7–4.0)
MCH: 24.6 pg — ABNORMAL LOW (ref 26.0–34.0)
MCHC: 30.4 g/dL (ref 30.0–36.0)
MCV: 80.8 fL (ref 80.0–100.0)
Monocytes Absolute: 0.6 10*3/uL (ref 0.1–1.0)
Monocytes Relative: 10 %
Neutro Abs: 3.4 10*3/uL (ref 1.7–7.7)
Neutrophils Relative %: 62 %
Platelets: 194 10*3/uL (ref 150–400)
RBC: 3.86 MIL/uL — ABNORMAL LOW (ref 3.87–5.11)
RDW: 19.4 % — ABNORMAL HIGH (ref 11.5–15.5)
WBC: 5.5 10*3/uL (ref 4.0–10.5)
nRBC: 0 % (ref 0.0–0.2)

## 2022-09-29 LAB — COMPREHENSIVE METABOLIC PANEL
ALT: 28 U/L (ref 0–44)
AST: 16 U/L (ref 15–41)
Albumin: 3.4 g/dL — ABNORMAL LOW (ref 3.5–5.0)
Alkaline Phosphatase: 85 U/L (ref 38–126)
Anion gap: 14 (ref 5–15)
BUN: 63 mg/dL — ABNORMAL HIGH (ref 8–23)
CO2: 25 mmol/L (ref 22–32)
Calcium: 9 mg/dL (ref 8.9–10.3)
Chloride: 99 mmol/L (ref 98–111)
Creatinine, Ser: 6.7 mg/dL — ABNORMAL HIGH (ref 0.44–1.00)
GFR, Estimated: 6 mL/min — ABNORMAL LOW (ref >60)
Glucose, Bld: 305 mg/dL — ABNORMAL HIGH (ref 70–99)
Potassium: 4.1 mmol/L (ref 3.5–5.1)
Sodium: 138 mmol/L (ref 135–145)
Total Bilirubin: 0.4 mg/dL (ref 0.3–1.2)
Total Protein: 7.2 g/dL (ref 6.5–8.1)

## 2022-09-29 LAB — LACTATE DEHYDROGENASE: LDH: 242 U/L — ABNORMAL HIGH (ref 98–192)

## 2022-09-29 LAB — IRON AND TIBC
Iron: 66 ug/dL (ref 28–170)
Saturation Ratios: 31 % (ref 10.4–31.8)
TIBC: 212 ug/dL — ABNORMAL LOW (ref 250–450)
UIBC: 146 ug/dL

## 2022-09-29 LAB — FERRITIN: Ferritin: 824 ng/mL — ABNORMAL HIGH (ref 11–307)

## 2022-09-30 DIAGNOSIS — Z992 Dependence on renal dialysis: Secondary | ICD-10-CM | POA: Diagnosis not present

## 2022-09-30 DIAGNOSIS — N2581 Secondary hyperparathyroidism of renal origin: Secondary | ICD-10-CM | POA: Diagnosis not present

## 2022-09-30 DIAGNOSIS — N186 End stage renal disease: Secondary | ICD-10-CM | POA: Diagnosis not present

## 2022-09-30 LAB — KAPPA/LAMBDA LIGHT CHAINS
Kappa free light chain: 202.5 mg/L — ABNORMAL HIGH (ref 3.3–19.4)
Kappa, lambda light chain ratio: 1.85 — ABNORMAL HIGH (ref 0.26–1.65)
Lambda free light chains: 109.6 mg/L — ABNORMAL HIGH (ref 5.7–26.3)

## 2022-09-30 NOTE — Telephone Encounter (Signed)
She went to ED on 09/25/22 and has a follow up with podiatry on 10/05/22 whom did her transmet amputation. 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 Lajoyce Corners did surgery on.

## 2022-10-01 ENCOUNTER — Encounter (HOSPITAL_COMMUNITY): Payer: Self-pay | Admitting: Emergency Medicine

## 2022-10-01 ENCOUNTER — Other Ambulatory Visit: Payer: Self-pay

## 2022-10-01 ENCOUNTER — Ambulatory Visit: Payer: Medicare PPO | Admitting: Podiatry

## 2022-10-01 ENCOUNTER — Emergency Department (HOSPITAL_COMMUNITY): Payer: Medicare PPO

## 2022-10-01 ENCOUNTER — Emergency Department (HOSPITAL_COMMUNITY)
Admission: EM | Admit: 2022-10-01 | Discharge: 2022-10-01 | Disposition: A | Discharge status: Rehabilitation Facility | Payer: Medicare PPO | Attending: Emergency Medicine | Admitting: Emergency Medicine

## 2022-10-01 DIAGNOSIS — I120 Hypertensive chronic kidney disease with stage 5 chronic kidney disease or end stage renal disease: Secondary | ICD-10-CM | POA: Diagnosis not present

## 2022-10-01 DIAGNOSIS — E1122 Type 2 diabetes mellitus with diabetic chronic kidney disease: Secondary | ICD-10-CM | POA: Diagnosis not present

## 2022-10-01 DIAGNOSIS — Z7984 Long term (current) use of oral hypoglycemic drugs: Secondary | ICD-10-CM | POA: Insufficient documentation

## 2022-10-01 DIAGNOSIS — Z7982 Long term (current) use of aspirin: Secondary | ICD-10-CM | POA: Insufficient documentation

## 2022-10-01 DIAGNOSIS — N186 End stage renal disease: Secondary | ICD-10-CM | POA: Insufficient documentation

## 2022-10-01 DIAGNOSIS — T8131XA Disruption of external operation (surgical) wound, not elsewhere classified, initial encounter: Secondary | ICD-10-CM | POA: Diagnosis not present

## 2022-10-01 DIAGNOSIS — Z992 Dependence on renal dialysis: Secondary | ICD-10-CM | POA: Insufficient documentation

## 2022-10-01 DIAGNOSIS — Z89432 Acquired absence of left foot: Secondary | ICD-10-CM

## 2022-10-01 DIAGNOSIS — Y838 Other surgical procedures as the cause of abnormal reaction of the patient, or of later complication, without mention of misadventure at the time of the procedure: Secondary | ICD-10-CM | POA: Insufficient documentation

## 2022-10-01 DIAGNOSIS — Z79899 Other long term (current) drug therapy: Secondary | ICD-10-CM | POA: Diagnosis not present

## 2022-10-01 DIAGNOSIS — Z794 Long term (current) use of insulin: Secondary | ICD-10-CM | POA: Insufficient documentation

## 2022-10-01 DIAGNOSIS — T8781 Dehiscence of amputation stump: Secondary | ICD-10-CM

## 2022-10-01 DIAGNOSIS — Z8616 Personal history of COVID-19: Secondary | ICD-10-CM | POA: Diagnosis not present

## 2022-10-01 DIAGNOSIS — Z89422 Acquired absence of other left toe(s): Secondary | ICD-10-CM | POA: Diagnosis not present

## 2022-10-01 DIAGNOSIS — M86672 Other chronic osteomyelitis, left ankle and foot: Secondary | ICD-10-CM

## 2022-10-01 DIAGNOSIS — T8130XA Disruption of wound, unspecified, initial encounter: Secondary | ICD-10-CM

## 2022-10-01 LAB — CBC WITH DIFFERENTIAL/PLATELET
Abs Immature Granulocytes: 0.03 10*3/uL (ref 0.00–0.07)
Basophils Absolute: 0 10*3/uL (ref 0.0–0.1)
Basophils Relative: 0 %
Eosinophils Absolute: 0 10*3/uL (ref 0.0–0.5)
Eosinophils Relative: 1 %
HCT: 34.2 % — ABNORMAL LOW (ref 36.0–46.0)
Hemoglobin: 10.2 g/dL — ABNORMAL LOW (ref 12.0–15.0)
Immature Granulocytes: 0 %
Lymphocytes Relative: 19 %
Lymphs Abs: 1.5 10*3/uL (ref 0.7–4.0)
MCH: 24.3 pg — ABNORMAL LOW (ref 26.0–34.0)
MCHC: 29.8 g/dL — ABNORMAL LOW (ref 30.0–36.0)
MCV: 81.4 fL (ref 80.0–100.0)
Monocytes Absolute: 0.6 10*3/uL (ref 0.1–1.0)
Monocytes Relative: 8 %
Neutro Abs: 5.6 10*3/uL (ref 1.7–7.7)
Neutrophils Relative %: 72 %
Platelets: 250 10*3/uL (ref 150–400)
RBC: 4.2 MIL/uL (ref 3.87–5.11)
RDW: 19.9 % — ABNORMAL HIGH (ref 11.5–15.5)
WBC: 7.7 10*3/uL (ref 4.0–10.5)
nRBC: 0 % (ref 0.0–0.2)

## 2022-10-01 LAB — COMPREHENSIVE METABOLIC PANEL
ALT: 30 U/L (ref 0–44)
AST: 18 U/L (ref 15–41)
Albumin: 3.6 g/dL (ref 3.5–5.0)
Alkaline Phosphatase: 94 U/L (ref 38–126)
Anion gap: 18 — ABNORMAL HIGH (ref 5–15)
BUN: 63 mg/dL — ABNORMAL HIGH (ref 8–23)
CO2: 25 mmol/L (ref 22–32)
Calcium: 9.5 mg/dL (ref 8.9–10.3)
Chloride: 98 mmol/L (ref 98–111)
Creatinine, Ser: 7.38 mg/dL — ABNORMAL HIGH (ref 0.44–1.00)
GFR, Estimated: 6 mL/min — ABNORMAL LOW (ref >60)
Glucose, Bld: 278 mg/dL — ABNORMAL HIGH (ref 70–99)
Potassium: 3.9 mmol/L (ref 3.5–5.1)
Sodium: 141 mmol/L (ref 135–145)
Total Bilirubin: 0.8 mg/dL (ref 0.3–1.2)
Total Protein: 7.6 g/dL (ref 6.5–8.1)

## 2022-10-01 LAB — I-STAT CG4 LACTIC ACID, ED
Lactic Acid, Venous: 1.7 mmol/L (ref 0.5–1.9)
Lactic Acid, Venous: 2.3 mmol/L (ref 0.5–1.9)

## 2022-10-01 NOTE — ED Triage Notes (Signed)
Patient arrives in wheelchair by POV sent by podiatry for concern of increasing drainage from left foot. Patient denies any pain. Currently on oral antibiotics.

## 2022-10-01 NOTE — Progress Notes (Signed)
HPI: 67 y.o. female presents today with her husband with concern of increasing trace drainage from the left transmetatarsal amputation site.  She has been getting daily dressing changes at the facility in which she currently is residing.  They have noted increasing drainage.  She went to the emergency department on 09/25/2022 and placed on oral antibiotics.  She states that she still taking those and not having any diarrhea or stomach distress.  Denies fever, chills, night sweats, nausea, vomiting.  As she is wearing her cam boot and is presents in a wheelchair today.  She is awake alert and oriented  Past Medical History:  Diagnosis Date   Acid reflux    Anemia    Arthritis    Axillary masses    Soft tissue - status post excision   Back pain    COVID-19 virus infection 04/06/2019   Depression    End-stage renal disease (HCC)    M/W/F dialysis   Essential hypertension    Headache    years ago   History of blood transfusion    History of cardiac catheterization    Normal coronary arteries October 2020   History of claustrophobia    History of pneumonia 2019   Hypoxia 04/03/2019   Memory loss    Mixed hyperlipidemia    Obesity    Pancreatitis    Peritoneal dialysis catheter in place [facility])    Pneumonia due to COVID-19 virus 04/02/2019   Sleep apnea    Noncompliant with CPAP   Stroke (HCC)    mini stroke   Type 2 diabetes mellitus (HCC)     Past Surgical History:  Procedure Laterality Date   ABDOMINAL AORTOGRAM W/LOWER EXTREMITY N/A 04/30/2022   Procedure: ABDOMINAL AORTOGRAM W/LOWER EXTREMITY;  Surgeon: Leonie Douglas, MD;  Location: [facility] INVASIVE CV LAB;  Service: Cardiovascular;  Laterality: N/A;   ABDOMINAL AORTOGRAM W/LOWER EXTREMITY N/A 07/21/2022   Procedure: ABDOMINAL AORTOGRAM W/LOWER EXTREMITY;  Surgeon: Nada Libman, MD;  Location: [facility] INVASIVE CV LAB;  Service: Cardiovascular;  Laterality: N/A;   ABDOMINAL HYSTERECTOMY     ACHILLES TENDON LENGTHENING   08/15/2022   Procedure: ACHILLES TENDON LENGTHENING;  Surgeon: [name], DPM;  Location: [facility] OR;  Service: Podiatry;;   AMPUTATION Right 05/29/2022   Procedure: RIGHT BELOW THE KNEE AMPUTATION;  Surgeon: [name], MD;  Location: [facility] OR;  Service: Orthopedics;  Laterality: Right;   AMPUTATION Left 09/04/2022   Procedure: AMPUTATION FOOT, serial irrigation;  Surgeon: Louann Sjogren, DPM;  Location: [facility] OR;  Service: Podiatry;  Laterality: Left;  Surgical team to do block   AV FISTULA PLACEMENT Left 09/02/2017   Procedure: creation of left arm ARTERIOVENOUS (AV) FISTULA;  Surgeon: Nada Libman, MD;  Location: [facility] OR;  Service: Vascular;  Laterality: Left;   COLONOSCOPY  2008   Dr. Darrick Penna: normal    COLONOSCOPY N/A 12/18/2016   Dr. Darrick Penna: multiple tubular adenomas, internal hemorrhoids. Surveillance in 3 years    ESOPHAGEAL DILATION N/A 10/13/2015   Procedure: ESOPHAGEAL DILATION;  Surgeon: Malissa Hippo, MD;  Location: [facility];  Service: Endoscopy;  Laterality: N/A;   ESOPHAGOGASTRODUODENOSCOPY N/A 10/13/2015   Dr. Karilyn Cota: chronic gastritis on path, no H.pylori. Empiric dilation    ESOPHAGOGASTRODUODENOSCOPY N/A 12/18/2016   Dr. Darrick Penna: mild gastritis. BRAVO study revealed uncontrolled GERD. Dysphagia secondary to uncontrolled reflux   FOOT SURGERY Bilateral    "nerve"     LEFT HEART CATH AND CORONARY ANGIOGRAPHY N/A  12/29/2018   Procedure: LEFT HEART CATH AND CORONARY ANGIOGRAPHY;  Surgeon: Corky Crafts, MD;  Location: [facility] INVASIVE CV LAB;  Service: Cardiovascular;  Laterality: N/A;   LOWER EXTREMITY ANGIOGRAPHY Right 05/04/2022   Procedure: Lower Extremity Angiography;  Surgeon: Victorino Sparrow, MD;  Location: [facility] INVASIVE CV LAB;  Service: Cardiovascular;  Laterality: Right;   LUNG BIOPSY     MASS EXCISION Right 01/09/2013   Procedure: EXCISION OF NEOPLASM OF RIGHT  AXILLA  AND EXCISION OF NEOPLASM OF LEFT AXILLA;  Surgeon: Dalia Heading, MD;  Location: [facility] ORS;   Service: General;  Laterality: Right;  procedure end @ 08:23   MYRINGOTOMY WITH TUBE PLACEMENT Bilateral 04/28/2017   Procedure: BILATERAL MYRINGOTOMY WITH TUBE PLACEMENT;  Surgeon: Newman Pies, MD;  Location: [facility] OR;  Service: ENT;  Laterality: Bilateral;   PERIPHERAL VASCULAR BALLOON ANGIOPLASTY Right 05/04/2022   Procedure: PERIPHERAL VASCULAR BALLOON ANGIOPLASTY;  Surgeon: Victorino Sparrow, MD;  Location: [facility] INVASIVE CV LAB;  Service: Cardiovascular;  Laterality: Right;  PT   PERIPHERAL VASCULAR INTERVENTION Right 05/04/2022   Procedure: PERIPHERAL VASCULAR INTERVENTION;  Surgeon: Victorino Sparrow, MD;  Location: [facility] INVASIVE CV LAB;  Service: Cardiovascular;  Laterality: Right;  SFA   PERIPHERAL VASCULAR INTERVENTION Left 07/21/2022   Procedure: PERIPHERAL VASCULAR INTERVENTION;  Surgeon: Nada Libman, MD;  Location: [facility];  Service: Cardiovascular;  Laterality: Left;   REVISION OF ARTERIOVENOUS GORETEX GRAFT Left 05/04/2018   Procedure: TRANSPOSITION OF CEPHALIC VEIN ARTERIOVENOUS FISTULA LEFT ARM;  Surgeon: Larina Earthly, MD;  Location: [facility] OR;  Service: Vascular;  Laterality: Left;   SAVORY DILATION N/A 12/18/2016   Procedure: SAVORY DILATION;  Surgeon: West Bali, MD;  [facility];  Service: Endoscopy;  Laterality: N/A;   TRANSMETATARSAL AMPUTATION Left 08/15/2022   Procedure: TRANSMETATARSAL AMPUTATION;  Surgeon: [name], DPM;  Location: [facility] OR;  Service: Podiatry;  Laterality: Left;    Allergies  Allergen Reactions   Ace Inhibitors Anaphylaxis and Swelling   Penicillins Itching, Swelling and Other (See Comments)    Did it involve swelling of the face/tongue/throat, SOB, or low BP? Unknown Did it involve sudden or severe rash/hives, skin peeling, or any reaction on the inside of your mouth or nose? Unknown Did you need to seek medical attention at a hospital or doctor's office? Unknown When did it last happen?      years  If all above answers are "NO",  may proceed with cephalosporin use.    Statins Other (See Comments)    elevated LFT's     Albuterol Swelling     Physical Exam: There were no vitals filed for this visit.  Upon removal of the dressing there is moderate drainage on the anterior and anterolateral portion of the dressing.  There is musty odor coming from the area.  All of the staples have pulled through the incision along the anterior and anterolateral aspect of the incision.  The very medial aspect seems to be reapproximated well.  The edges are macerated with fibrous tissue noted as well.  Some eschar formation is seen along the incision edges.  No purulence is noted.  No crepitus with manipulation of the soft tissues in the area.  Nonpalpable pedal pulses.  Assessment/Plan of Care: 1. S/P transmetatarsal amputation of foot, left (HCC)   2. Dehiscence of amputation stump of left lower extremity (HCC)     Discussed clinical findings with patient today.  I discussed the patient's presentation  today with one of our other providers.  It was in agreement to refer the patient to the emergency room for consultation with our on-call podiatrist, Dr. Logan Bores to evaluate the patient and possibly need to take the patient back to the operating room for revision of the TMA site with copious lavage of the area, and then reclosure.  The patient's daughter was on the phone during the entire encounter today.  She was in agreement of the plan as well.  The staples were then removed today that were not holding the incision closed any longer, Steri-Strips were applied across the incision after the wound was flushed.  Betadine Adaptic and a dry sterile dressing were applied followed by an Ace wrap.  She was placed back in her cam boot.  They will head to the emergency room today.   Clerance Lav, DPM, FACFAS [facility]     [address] [place], [place] [address]                Office [phone]  Fax [phone]

## 2022-10-01 NOTE — ED Provider Notes (Signed)
[place] EMERGENCY DEPARTMENT AT [facility] Provider Note   CSN: [id number] Arrival date & time: 10/01/22  1200     History Chief Complaint  Patient presents with   Post-op Problem    Kathryn Beck is a 67 y.o. female with h/o end-stage renal disease on hemodialysis MWF, hypertension, peripheral vascular disease presents emerged from today for after being seen at podiatry.  Patient was sent over for emergent evaluation by another podiatrist in the ER for possible surgical revision/washout.  Patient reports compliancy to her antibiotics however not remember what they are called.  She denies any fevers.  HPI     Home Medications Prior to Admission medications   Medication Sig Start Date End Date Taking? Authorizing Provider  aspirin 81 MG chewable tablet CHEW TWO TABLETS BY MOUTH EVERY DAY Patient taking differently: Chew 81 mg by mouth daily. 07/24/22   [name], MD  azelastine (OPTIVAR) 0.05 % ophthalmic solution Place 1 drop into both eyes 2 (two) times daily. 05/25/22   [name], MD  B Complex-C-Folic Acid (RENA-VITE RX) 1 MG TABS Take 1 tablet by mouth daily. 04/15/22   [provider]  Blood Glucose Monitoring Suppl (ONETOUCH VERIO) w/Device KIT Use to check blood sugar 4X daily. 04/16/22   Carlus Pavlov, MD  calcitRIOL (ROCALTROL) 0.25 MCG capsule TAKE 1 CAPSULE (0.25 MCG TOTAL) BY MOUTH 2 (TWO) TIMES DAILY WITH A MEAL. 06/05/22   Roma Kayser, MD  cinacalcet (SENSIPAR) 30 MG tablet Take 3 tablets (90 mg total) by mouth 3 (three) times a week. 09/09/22   Marguerita Merles Latif, DO  ciprofloxacin (CIPRO) 500 MG tablet Take 1 tablet (500 mg total) by mouth at bedtime for 42 doses. Take after hemodialysis on HD days 09/08/22 10/20/22  Marguerita Merles Latif, DO  Continuous Blood Gluc Receiver (FREESTYLE LIBRE 2 READER) DEVI 1 each by Does not apply route daily. 04/16/22   Carlus Pavlov, MD  Continuous Blood Gluc Sensor (FREESTYLE LIBRE 2  SENSOR) MISC 1 each by Does not apply route every 14 (fourteen) days. 04/16/22   Carlus Pavlov, MD  doxycycline (VIBRAMYCIN) 100 MG capsule Take 1 capsule (100 mg total) by mouth 2 (two) times daily. 09/08/22 10/20/22  Marguerita Merles Latif, DO  DULoxetine (CYMBALTA) 60 MG capsule TAKE (1) CAPSULE BY MOUTH ONCE DAILY. Patient taking differently: Take 60 mg by mouth daily. 09/17/20   [name], MD  ezetimibe (ZETIA) 10 MG tablet Take 1 tablet (10 mg total) by mouth daily. 05/25/22   [name], MD  FIASP FLEXTOUCH 100 UNIT/ML FlexTouch Pen Inject 10-20 Units into the skin See admin instructions. Inj 10-20 three times daily via sliding scale 08/06/22   [provider]  furosemide (LASIX) 80 MG tablet Take 80 mg by mouth 2 (two) times daily. 05/14/22   [provider]  glucose blood (ONETOUCH VERIO) test strip Use as instructed to check blood sugar 4X daily. 04/16/22   Carlus Pavlov, MD  hydrOXYzine (ATARAX) 25 MG tablet Take 25 mg by mouth every 12 (twelve) hours as needed for itching.    [provider]  insulin glargine, 2 Unit Dial, (TOUJEO MAX SOLOSTAR) 300 UNIT/ML Solostar Pen Inject 30 Units into the skin daily. May need to slowly increase the dose depending upon your blood sugar, follow-up with PCP 07/22/22   Lanae Boast, MD  Insulin Pen Needle 32G X 4 MM MISC Use 4x a day 05/25/22   Carlus Pavlov, MD  metoprolol succinate (  TOPROL-XL) 50 MG 24 hr tablet TAKE (1) TABLET BY MOUTH DAILY WITH FOOD *TAKE AFTER DIALYSIS* Patient taking differently: Take 50 mg by mouth daily. 07/29/21   [name], MD  Nutritional Supplements (,FEEDING SUPPLEMENT, PROSOURCE PLUS) liquid Take 30 mLs by mouth 2 (two) times daily between meals. 09/08/22   Marguerita Merles Latif, DO  OneTouch Delica Lancets 33G MISC Use to check blood sugar 4X daily. 04/16/22   Carlus Pavlov, MD  oxyCODONE-acetaminophen (PERCOCET/ROXICET) 5-325 MG tablet Take 1 tablet by mouth every 4 (four)  hours as needed. 09/08/22   Marguerita Merles Latif, DO  pregabalin (LYRICA) 50 MG capsule TAKE ONE CAPSULE BY MOUTH three times a day 08/24/22   Vickki Hearing, MD  sertraline (ZOLOFT) 50 MG tablet TAKE ONE TABLET BY MOUTH EVERY DAY 09/22/22   [name], MD  torsemide (DEMADEX) 100 MG tablet TAKE 1 TABLET BY MOUTH TWICE DAILY *HOLD MORNING DOSE ON DIALYSIS DAYS* 09/22/22   [name], MD  [name] 500 MG chewable tablet Chew 500-1,000 mg by mouth See admin instructions. Take 1000mg  (2 tablets) by mouth with meals and 500mg  (1 tablet) with snacks 04/15/22   [provider]  FLUoxetine (PROZAC) 10 MG capsule Take 10 mg by mouth daily.    05/28/11  [provider]  glipiZIDE (GLUCOTROL) 10 MG tablet Take 10 mg by mouth 2 (two) times daily before a meal.    05/28/11  [provider]      Allergies    Ace inhibitors, Penicillins, Statins, and Albuterol    Review of Systems   Review of Systems  Constitutional:  Negative for chills and fever.  Skin:  Positive for wound.    Physical Exam Updated Vital Signs BP (!) 169/66 (BP Location: Right Arm)   Pulse 77   Temp 98.2 F (36.8 C)   Resp 16   Ht 5\' 4"  (1.626 m)   Wt 78.9 kg   SpO2 100%   BMI 29.87 kg/m  Physical Exam Vitals and nursing note reviewed.  Constitutional:      General: She is not in acute distress.    Appearance: She is not toxic-appearing.  Eyes:     General: No scleral icterus. Pulmonary:     Effort: Pulmonary effort is normal. No respiratory distress.  Musculoskeletal:     Comments: BKA on the right. Wound seen on the left foot. Thick steri strips on the wound. Dry skin present. DP and PT pulse doppler able and marked with an X. Compartments are soft.   Skin:    General: Skin is warm and dry.  Neurological:     Mental Status: She is alert.     ED Results / Procedures / Treatments   Labs (all labs ordered are listed, but only abnormal results are displayed) Labs Reviewed   COMPREHENSIVE METABOLIC PANEL - Abnormal; Notable for the following components:      Result Value   Glucose, Bld 278 (*)    BUN 63 (*)    Creatinine, Ser 7.38 (*)    GFR, Estimated 6 (*)    Anion gap 18 (*)    All other components within normal limits  CBC WITH DIFFERENTIAL/PLATELET - Abnormal; Notable for the following components:   Hemoglobin 10.2 (*)    HCT 34.2 (*)    MCH 24.3 (*)    MCHC 29.8 (*)    RDW 19.9 (*)    All other components within normal limits  I-STAT CG4 LACTIC ACID, ED -  Abnormal; Notable for the following components:   Lactic Acid, Venous 2.3 (*)    All other components within normal limits  I-STAT CG4 LACTIC ACID, ED    EKG None  Radiology DG Foot 2 Views Left  Result Date: 10/01/2022 CLINICAL DATA:  Postoperative wound. EXAM: LEFT FOOT - 2 VIEW COMPARISON:  September 25, 2022. FINDINGS: Status post transmetatarsal amputation of the foot. Expected postoperative changes including surgical staples are seen in the stomach. Vascular calcifications are noted. There does appear to be air within a wound within the soft tissues of the stump distally. No definite evidence of osteomyelitis. IMPRESSION: Status post transmetatarsal amputation of all 5 rays. There does appear to be and opened and air-filled wound in the distal stump, but no definite osteomyelitis is noted. Electronically Signed   By: Lupita Raider M.D.   On: 10/01/2022 15:35    Procedures Procedures   Medications Ordered in ED Medications - No data to display  ED Course/ Medical Decision Making/ A&P Clinical Course as of 10/01/22 1554  Thu Oct 01, 2022  513 67 year old female sent in from podiatry office for worsening signs of infection of left foot.  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  Getting lab work x-ray and IV antibiotics.  Anticipate will need admission. [MB]    Clinical Course User Index [MB] Terrilee Files, MD    Medical Decision  Making Amount and/or Complexity of Data Reviewed Labs: ordered. Radiology: ordered.   67 y.o. female presents to the ER for evaluation of foot wound. Differential diagnosis includes but is not limited to osteomyelitis's, dehiscence, infection. Vital signs slightly elevated blood pressure otherwise unremarkable. Physical exam as noted above.   On previous chart evaluation, patient was seen by podiatry today and sent over to the emergency department for Dr. Logan Bores to evaluate this in the ER for possible revision/washout.  No other recommendations were listed.  Will order x-ray and labs.  Blood pressure slightly elevated however afebrile with normal heart rate.  Denies any fevers or chills recently.  It appears patient was discharged on doxycycline and Cipro.  She reports compliancy to these. Dr. Ralene Cork did the revision on 09/04/22.  I independently reviewed and interpreted the patient's labs.  CBC shows anemia but no leukocytosis.  CMP shows elevated glucose of 278.  Creatinine and BUN elevated however patient is known ESRD on dialysis.  Anion gap of 18.  No other electrolyte or LFT abnormality.  I-STAT lactic acid within normal limits.  X-ray imaging shows Status post transmetatarsal amputation of all 5 rays. There does appear to be and opened and air-filled wound in the distal stump, but no definite osteomyelitis is noted.  Dr. Logan Bores was consulted x3. Was able to hear back from him. He will come and evaluate the patient at bedside. No other recommendations at this time. Will hand off to oncoming shift for follow up on recommendations.   3:54 PM Care of Kathryn Beck  transferred to Dr. Fayrene Helper at the end of my shift as the patient will require reassessment once labs/imaging have resulted. Patient presentation, ED course, and plan of care discussed with review of all pertinent labs and imaging. Please see his/her note for further details regarding further ED course and disposition. Plan at time  of handoff is follow up with Dr. Logan Bores. This may be altered or completely changed at the discretion of the oncoming team pending results of further workup.  Portions of this report may have been transcribed using voice recognition software. Every effort was made to ensure accuracy; however, inadvertent computerized transcription errors may be present.   I discussed this case with my attending physician who cosigned this note including patient's presenting symptoms, physical exam, and planned diagnostics and interventions. Attending physician stated agreement with plan or made changes to plan which were implemented.   Attending physician assessed patient at bedside.  Final Clinical Impression(s) / ED Diagnoses Final diagnoses:  None    Rx / DC Orders ED Discharge Orders     None         Achille Rich, PA-C 10/01/22 1603    Terrilee Files, MD 10/01/22 1728

## 2022-10-01 NOTE — Discharge Instructions (Signed)
You are seen today for evaluation of your left foot.  While you were here we got imaging, you saw a podiatrist, and we checked labs and performed a physical exam.  All of these were reassuring.  The podiatrist talked to you about the possibility of a BKA.  I have called Dr. Rosann Auerbach group and discussed a plan for you.  They would like for you to continue your medication (antibiotic) and call their office tomorrow to schedule an appointment with Dr. Due to on Monday for evaluation for a below-knee amputation.  Return to the emergency department if you develop any fever, worsening pain or have any new concerns about the wound.

## 2022-10-01 NOTE — Consult Note (Signed)
PODIATRY CONSULTATION  NAME Kathryn Beck MRN [id number] DOB 03-May-1955 DOA 10/01/2022   Reason for consult: Dehiscence of amputation site left Chief Complaint  Patient presents with   Post-op Problem   History of present illness: 67 y.o. female PMHx ESRD on hemodialysis, diabetes mellitus, PVD status post BKA RLE and multiple failed partial foot amputations LLE originally seen in the podiatry office today.  She states that her amputation site had broken open.  Recommend to go to the emergency department for evaluation.  Patient seen at bedside in the emergency department with her husband present and her daughter, Maryan Char, on the telephone.   Past Medical History:  Diagnosis Date   Acid reflux    Anemia    Arthritis    Axillary masses    Soft tissue - status post excision   Back pain    COVID-19 virus infection 04/06/2019   Depression    End-stage renal disease (HCC)    M/W/F dialysis   Essential hypertension    Headache    years ago   History of blood transfusion    History of cardiac catheterization    Normal coronary arteries October 2020   History of claustrophobia    History of pneumonia 2019   Hypoxia 04/03/2019   Memory loss    Mixed hyperlipidemia    Obesity    Pancreatitis    Peritoneal dialysis catheter in [facility])    Pneumonia due to COVID-19 virus 04/02/2019   Sleep apnea    Noncompliant with CPAP   Stroke (HCC)    mini stroke   Type 2 diabetes mellitus (HCC)        Latest Ref Rng & Units 10/01/2022   12:08 PM 09/29/2022    2:43 PM 09/25/2022    3:32 PM  CBC  WBC 4.0 - 10.5 K/uL 7.7  5.5  6.2   Hemoglobin 12.0 - 15.0 g/dL 38.1  9.5  9.5   Hematocrit 36.0 - 46.0 % 34.2  31.2  31.4   Platelets 150 - 400 K/uL 250  194  192        Latest Ref Rng & Units 10/01/2022   12:08 PM 09/29/2022    2:43 PM 09/25/2022    3:32 PM  BMP  Glucose 70 - 99 mg/dL 017  510  258   BUN 8 - 23 mg/dL 63  63  31   Creatinine 0.44 - 1.00 mg/dL 5.27  7.82  4.23   Sodium 135 -  145 mmol/L 141  138  139   Potassium 3.5 - 5.1 mmol/L 3.9  4.1  4.1   Chloride 98 - 111 mmol/L 98  99  102   CO2 22 - 32 mmol/L 25  25  24    Calcium 8.9 - 10.3 mg/dL 9.5  9.0  8.7       Physical Exam: General: The patient is alert and oriented x3 in no acute distress.   Dermatology: Skin is warm, dry and supple bilateral lower extremities. Negative for open lesions or macerations.  Vascular: Abdominal aortogram w/ LE peripheral revascularization 07/21/2022. Dr. Juleen China VVS Impression:            #1  Total occlusion of the distal left superficial femoral and left proximal above-knee popliteal artery successfully crossed.  This was heavily calcified so it was initially treated with shockwave intra-arterial lithotripsy followed by stenting.  I deployed to 7 x 150 overlapping drug-coated Eluvia stents            #  2  Greater than 50% below-knee popliteal artery stenosis treated using a 4.5 shockwave lithotripsy balloon            #3  Single-vessel runoff via the left peroneal artery which collateralizes onto the foot            #4  The patient has been maximally revascularized at this time.  She needs to continue on Plavix and aspirin.  She has a statin allergy.  Neurological: Light touch and protective threshold absent  Musculoskeletal Exam: BKA RLE. Failed revisional TMA LLE.     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  Had a long discussion discussing the advantages and disadvantages as well as risks and benefits of each treatment plan.  After long discussion ultimately the patient and family agree with below-knee amputation.  Given the patient's history of failed revisional amputations as well as single-vessel runoff with severe PVD to the  foot, I agree that below knee amputation would be in the best interest of the patient -Discussed with ED physician. Will plan to consult either vascular or orthopedics.  -Podiatry will sign off  Please contact me directly via secure chat with any questions or concerns.     Felecia Shelling, DPM [facility]  Dr. Felecia Shelling, DPM    [address] [place], [place] [address]                Office [phone]  Fax [phone]

## 2022-10-01 NOTE — ED Provider Notes (Signed)
Patient received in handoff, briefly patient underwent a ray amputation of left foot approximately 5 days ago, and has had dehiscence of the wound.  She was evaluated at podiatry outpatient today and sent to emergency department.  Workup here shows no leukocytosis, she has been hemodynamically stable.  We are awaiting podiatry consult.  Podiatry saw the patient, please see their note for full evaluation.  Ultimately, there was no more operative management to be offered to her by podiatry.  The recommendation was for discussion of BKA with orthopedic surgery.  Paged Dr. Rosann Auerbach group for guidance on management, and given the patient is remained stable, is on appropriate antibiotics, and is not showing any signs of hematogenous spread of the infection, that she is stable for discharge at this time and can follow-up with her group as an outpatient.  I provided information for scheduling to her.  Discussed this plan with the patient and her husband at bedside.  I discussed the plan for discharge with the patient and/or their surrogate at bedside prior to discharge and they were in agreement with the plan and verbalized understanding of the return precautions provided. All questions answered to the best of my ability. Ultimately, the patient was discharged in stable condition with stable vital signs. I am reassured that they are capable of close follow up and good social support.    Fayrene Helper, MD 10/01/22 2015    Melene Plan, DO 10/01/22 2030

## 2022-10-02 ENCOUNTER — Telehealth: Payer: Self-pay | Admitting: Family Medicine

## 2022-10-02 ENCOUNTER — Telehealth: Payer: Self-pay | Admitting: Orthopedic Surgery

## 2022-10-02 DIAGNOSIS — Z992 Dependence on renal dialysis: Secondary | ICD-10-CM | POA: Diagnosis not present

## 2022-10-02 DIAGNOSIS — N186 End stage renal disease: Secondary | ICD-10-CM | POA: Diagnosis not present

## 2022-10-02 DIAGNOSIS — N2581 Secondary hyperparathyroidism of renal origin: Secondary | ICD-10-CM | POA: Diagnosis not present

## 2022-10-02 LAB — PROTEIN ELECTROPHORESIS, SERUM
A/G Ratio: 1.1 (ref 0.7–1.7)
Albumin ELP: 3.6 g/dL (ref 2.9–4.4)
Alpha-1-Globulin: 0.2 g/dL (ref 0.0–0.4)
Alpha-2-Globulin: 0.7 g/dL (ref 0.4–1.0)
Beta Globulin: 1 g/dL (ref 0.7–1.3)
Gamma Globulin: 1.3 g/dL (ref 0.4–1.8)
Globulin, Total: 3.2 g/dL (ref 2.2–3.9)
Total Protein ELP: 6.8 g/dL (ref 6.0–8.5)

## 2022-10-02 LAB — IMMUNOFIXATION ELECTROPHORESIS
IgA: 404 mg/dL — ABNORMAL HIGH (ref 87–352)
IgG (Immunoglobin G), Serum: 1372 mg/dL (ref 586–1602)
IgM (Immunoglobulin M), Srm: 48 mg/dL (ref 26–217)
Total Protein ELP: 6.8 g/dL (ref 6.0–8.5)

## 2022-10-02 NOTE — Telephone Encounter (Signed)
Pt was seen in ED yesterday and needs to follow up with Lajoyce Corners for Eval of amput to her other leg no earlier appt avail until 07/23 and did not want to see Denny Peon please advise Please call Daughter [name] at [phone]

## 2022-10-02 NOTE — Telephone Encounter (Signed)
TOC d/c 07.13.2024 from [facility]

## 2022-10-02 NOTE — Telephone Encounter (Signed)
SW pt's daughter. Patient is scheduled for Monday afternoon for follow up

## 2022-10-05 ENCOUNTER — Ambulatory Visit (INDEPENDENT_AMBULATORY_CARE_PROVIDER_SITE_OTHER): Payer: Medicare PPO | Admitting: Orthopedic Surgery

## 2022-10-05 ENCOUNTER — Encounter: Payer: Medicare PPO | Admitting: Podiatry

## 2022-10-05 DIAGNOSIS — Z89511 Acquired absence of right leg below knee: Secondary | ICD-10-CM

## 2022-10-05 DIAGNOSIS — T8781 Dehiscence of amputation stump: Secondary | ICD-10-CM

## 2022-10-05 DIAGNOSIS — Z89432 Acquired absence of left foot: Secondary | ICD-10-CM | POA: Diagnosis not present

## 2022-10-05 DIAGNOSIS — Z992 Dependence on renal dialysis: Secondary | ICD-10-CM | POA: Diagnosis not present

## 2022-10-05 DIAGNOSIS — N2581 Secondary hyperparathyroidism of renal origin: Secondary | ICD-10-CM | POA: Diagnosis not present

## 2022-10-05 DIAGNOSIS — N186 End stage renal disease: Secondary | ICD-10-CM | POA: Diagnosis not present

## 2022-10-05 NOTE — Progress Notes (Deleted)
NO SHOW

## 2022-10-06 ENCOUNTER — Encounter (HOSPITAL_COMMUNITY): Payer: Self-pay | Admitting: Orthopedic Surgery

## 2022-10-06 ENCOUNTER — Inpatient Hospital Stay: Payer: Medicare PPO | Admitting: Physician Assistant

## 2022-10-06 ENCOUNTER — Other Ambulatory Visit: Payer: Self-pay

## 2022-10-06 ENCOUNTER — Encounter: Payer: Self-pay | Admitting: Orthopedic Surgery

## 2022-10-06 NOTE — Progress Notes (Signed)
Office Visit Note   Patient: Kathryn Beck           Date of Birth: 1955-12-01           MRN: [id number] Visit Date: 10/05/2022              Requested by: [name], MD [address], [address] [place],  [place] [address] PCP: [name], MD  Chief Complaint  Patient presents with   Left Foot - Wound Check    Dehiscence of left transmet amputation      HPI: Patient is a 67 year old woman who is 4 weeks status post left transmetatarsal amputation and debridement.  Initial amputation with Dr. Lilian Kapur May 25 and follow-up debridement with Dr. Ralene Cork on June 14.  Patient receives dialysis Monday Wednesday Friday.  Assessment & Plan: Visit Diagnoses:  1. Right below-knee amputee (HCC)   2. Dehiscence of amputation stump of left lower extremity (HCC)   3. History of transmetatarsal amputation of left foot (HCC)     Plan: With the progressive wound dehiscence I have recommended proceeding with a transtibial amputation risks and benefits were discussed patient states she understands and wished to proceed with surgery on Wednesday.  Plan for her to postpone her dialysis on Wednesday and will proceed with dialysis in the hospital on Thursday.  Follow-Up Instructions: No follow-ups on file.   Ortho Exam  Patient is alert, oriented, no adenopathy, well-dressed, normal affect, normal respiratory effort. Examination patient has multiphasic pulse dorsalis pedis by Doppler.  Examination of the forefoot patient has a necrotic wound over the transmetatarsal amputation that probes to bone.  There is no ascending cellulitis no purulent drainage.  Hemoglobin is stable at 10.2.  Imaging: No results found.   Labs: Lab Results  Component Value Date   HGBA1C 6.3 (H) 07/07/2022   HGBA1C 9.3 (A) 04/16/2022   HGBA1C 8.9 (A) 10/28/2021   ESRSEDRATE 70 (H) 09/03/2022   ESRSEDRATE 79 (H) 08/13/2022   ESRSEDRATE 37 (H) 04/25/2022   CRP 0.9 09/03/2022   CRP 9.1 (H)  08/13/2022   CRP 29.4 (H) 04/25/2022   LABURIC 1.8 (L) 06/07/2018   LABURIC 6.2 04/22/2018   LABURIC 9.3 (H) 10/07/2015   REPTSTATUS 09/09/2022 FINAL 09/04/2022   GRAMSTAIN  09/04/2022    RARE WBC PRESENT, PREDOMINANTLY PMN NO ORGANISMS SEEN    CULT  09/04/2022    RARE ENTEROBACTER HORMAECHEI NO ANAEROBES ISOLATED Performed at [facility], [address], [place], [place] [address]    LABORGA ENTEROBACTER HORMAECHEI 09/04/2022     Lab Results  Component Value Date   ALBUMIN 3.6 10/01/2022   ALBUMIN 3.4 (L) 09/29/2022   ALBUMIN 3.3 (L) 09/25/2022   PREALBUMIN 26 08/13/2022   PREALBUMIN 24 04/25/2022    Lab Results  Component Value Date   MG 1.9 09/08/2022   MG 2.3 09/07/2022   MG 1.9 09/06/2022   Lab Results  Component Value Date   VD25OH 30.9 09/28/2017   VD25OH 15 (L) 10/07/2015   VD25OH 16 (L) 04/25/2015    Lab Results  Component Value Date   PREALBUMIN 26 08/13/2022   PREALBUMIN 24 04/25/2022      Latest Ref Rng & Units 10/01/2022   12:08 PM 09/29/2022    2:43 PM 09/25/2022    3:32 PM  CBC EXTENDED  WBC 4.0 - 10.5 K/uL 7.7  5.5  6.2   RBC 3.87 - 5.11 MIL/uL 4.20  3.86  3.74   Hemoglobin  12.0 - 15.0 g/dL 96.2  9.5  9.5   HCT 95.2 - 46.0 % 34.2  31.2  31.4   Platelets 150 - 400 K/uL 250  194  192   NEUT# 1.7 - 7.7 K/uL 5.6  3.4  3.9   Lymph# 0.7 - 4.0 K/uL 1.5  1.5  1.5      There is no height or weight on file to calculate BMI.  Orders:  No orders of the defined types were placed in this encounter.  No orders of the defined types were placed in this encounter.    Procedures: No procedures performed  Clinical Data: No additional findings.  ROS:  All other systems negative, except as noted in the HPI. Review of Systems  Objective: Vital Signs: There were no vitals taken for this visit.  Specialty Comments:  No specialty comments available.  PMFS History: Patient Active Problem List   Diagnosis Date Noted   Osteomyelitis (HCC)  09/03/2022   Left foot infection 09/03/2022   Status post transmetatarsal amputation of foot, left (HCC) 09/03/2022   Wound infection after surgery 09/03/2022   Equinus contracture of left ankle 08/15/2022   Acute osteomyelitis of toe of left foot (HCC) 08/13/2022   Peripheral vascular complication 07/21/2022   PVD (peripheral vascular disease) (HCC) 07/18/2022   Toe ulcer, left, limited to breakdown of skin (HCC) 07/13/2022   S/P BKA (below knee amputation), right (HCC) 05/29/2022   Confusion 05/19/2022   Visual hallucinations 05/19/2022   Watery eyes 05/19/2022   Requires assistance with activities of daily living (ADL) 04/26/2022   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   Gangrene of right foot (HCC) 04/25/2022   PAD (peripheral artery disease) (HCC) 04/25/2022   Other osteoporosis without current pathological fracture 11/10/2021   Hypocalcemia 11/10/2021   Fall 11/02/2021   Left foot pain 10/31/2021   Low back pain with left-sided sciatica 10/31/2021   Left ear impacted cerumen 09/18/2021   Chronic left shoulder pain 06/10/2021   Abnormal CXR 03/10/2021   Ear pain, bilateral 03/10/2021   Morbid obesity (HCC) 03/10/2021   Subungual hematoma of second toe of left foot 10/28/2020   Insomnia 10/28/2020   Memory loss or impairment 09/26/2020   Forgetfulness 09/26/2020   Recurrent vertigo 09/26/2020   Abnormal MRI, cervical spine 03/04/2020   Right leg weakness 02/28/2020   Knee pain, right 10/23/2019   Cough 08/09/2019   Carpal tunnel syndrome 06/13/2019   Chronic pain syndrome 06/13/2019   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) 06/13/2019   Neck pain 03/23/2019   Near syncope 12/05/2018   ESRD on hemodialysis (HCC) 11/22/2018   Ischemic heart disease 09/25/2018   Not currently working due to disabled status 06/13/2018   HTN (hypertension) 06/12/2018   Depression 03/09/2018   Adrenal mass, left (HCC) 11/09/2017   MGUS (monoclonal  gammopathy of unknown significance) 11/05/2017   Shoulder pain 11/01/2017   Chronic diastolic (congestive) heart failure (HCC) 09/20/2017   Bilateral leg weakness 09/09/2017   Adrenal adenoma 09/03/2017   Uncontrolled type 2 diabetes mellitus 08/18/2017   Unsteady gait 07/11/2017   Recurrent falls 07/11/2017   Anemia of chronic disease 03/24/2017   Personal history of colonic polyps 02/10/2017   Dysphagia 11/05/2016   Irritable bowel syndrome 02/04/2016   Hospital discharge follow-up 10/27/2015   Intolerant of heat 10/07/2014   Cardiac murmur 01/17/2014   Back pain with left-sided radiculopathy 09/18/2013   Seasonal allergies 03/10/2013   Lipoma of back 12/22/2012  Other seasonal allergic rhinitis 08/22/2012   OSA (obstructive sleep apnea) 06/02/2011   Chronic kidney disease, stage 4 (severe) (HCC) 01/13/2011   Neuropathy 04/16/2010   Malaise and fatigue 07/24/2009   Personal history of other diseases of the nervous system and sense organs 05/08/2009   LUPUS ERYTHEMATOSUS, DISCOID 06/05/2008   Arm pain 05/30/2008   Hyperlipidemia 06/07/2007   Obesity 06/07/2007   Malignant hypertension 06/07/2007   Type 2 diabetes mellitus with hyperglycemia (HCC) 06/07/2007   Diabetes mellitus (HCC) 06/07/2007   Past Medical History:  Diagnosis Date   Acid reflux    Anemia    Arthritis    Axillary masses    Soft tissue - status post excision   Back pain    COVID-19 virus infection 04/06/2019   Depression    End-stage renal disease (HCC)    M/W/F dialysis   Essential hypertension    Headache    years ago   History of blood transfusion    History of cardiac catheterization    Normal coronary arteries October 2020   History of claustrophobia    History of pneumonia 2019   Hypoxia 04/03/2019   Memory loss    Mixed hyperlipidemia    Obesity    Pancreatitis    Peritoneal dialysis catheter in place [facility])    Pneumonia due to COVID-19 virus 04/02/2019   Sleep apnea    Noncompliant  with CPAP   Stroke (HCC)    mini stroke   Type 2 diabetes mellitus (HCC)     Family History  Problem Relation Age of Onset   Hypertension Father    Hypercholesterolemia Father    Arthritis Father    Hypertension Sister    Hypercholesterolemia Sister    Breast cancer Sister    Hypertension Sister    Colon cancer Neg Hx    Colon polyps Neg Hx     Past Surgical History:  Procedure Laterality Date   ABDOMINAL AORTOGRAM W/LOWER EXTREMITY N/A 04/30/2022   Procedure: ABDOMINAL AORTOGRAM W/LOWER EXTREMITY;  Surgeon: Leonie Douglas, MD;  Location: [facility] INVASIVE CV LAB;  Service: Cardiovascular;  Laterality: N/A;   ABDOMINAL AORTOGRAM W/LOWER EXTREMITY N/A 07/21/2022   Procedure: ABDOMINAL AORTOGRAM W/LOWER EXTREMITY;  Surgeon: Nada Libman, MD;  Location: [facility];  Service: Cardiovascular;  Laterality: N/A;   ABDOMINAL HYSTERECTOMY     ACHILLES TENDON LENGTHENING  08/15/2022   Procedure: ACHILLES TENDON LENGTHENING;  Surgeon: [name], DPM;  Location: [facility] OR;  Service: Podiatry;;   AMPUTATION Right 05/29/2022   Procedure: RIGHT BELOW THE KNEE AMPUTATION;  Surgeon: [name], MD;  Location: [facility] OR;  Service: Orthopedics;  Laterality: Right;   AMPUTATION Left 09/04/2022   Procedure: AMPUTATION FOOT, serial irrigation;  Surgeon: Louann Sjogren, DPM;  Location: [facility] OR;  Service: Podiatry;  Laterality: Left;  Surgical team to do block   AV FISTULA PLACEMENT Left 09/02/2017   Procedure: creation of left arm ARTERIOVENOUS (AV) FISTULA;  Surgeon: Nada Libman, MD;  Location: [facility] OR;  Service: Vascular;  Laterality: Left;   COLONOSCOPY  2008   Dr. Darrick Penna: normal    COLONOSCOPY N/A 12/18/2016   Dr. Darrick Penna: multiple tubular adenomas, internal hemorrhoids. Surveillance in 3 years    ESOPHAGEAL DILATION N/A 10/13/2015   Procedure: ESOPHAGEAL DILATION;  Surgeon: Malissa Hippo, MD;  Location: [facility];  Service: Endoscopy;  Laterality: N/A;   ESOPHAGOGASTRODUODENOSCOPY N/A  10/13/2015   Dr. Karilyn Cota: chronic gastritis on path, no H.pylori. Empiric dilation  ESOPHAGOGASTRODUODENOSCOPY N/A 12/18/2016   Dr. Darrick Penna: mild gastritis. BRAVO study revealed uncontrolled GERD. Dysphagia secondary to uncontrolled reflux   FOOT SURGERY Bilateral    "nerve"     LEFT HEART CATH AND CORONARY ANGIOGRAPHY N/A 12/29/2018   Procedure: LEFT HEART CATH AND CORONARY ANGIOGRAPHY;  Surgeon: Corky Crafts, MD;  Location: [facility] INVASIVE CV LAB;  Service: Cardiovascular;  Laterality: N/A;   LOWER EXTREMITY ANGIOGRAPHY Right 05/04/2022   Procedure: Lower Extremity Angiography;  Surgeon: Victorino Sparrow, MD;  Location: [facility] INVASIVE CV LAB;  Service: Cardiovascular;  Laterality: Right;   LUNG BIOPSY     MASS EXCISION Right 01/09/2013   Procedure: EXCISION OF NEOPLASM OF RIGHT  AXILLA  AND EXCISION OF NEOPLASM OF LEFT AXILLA;  Surgeon: Dalia Heading, MD;  Location: [facility] ORS;  Service: General;  Laterality: Right;  procedure end @ 08:23   MYRINGOTOMY WITH TUBE PLACEMENT Bilateral 04/28/2017   Procedure: BILATERAL MYRINGOTOMY WITH TUBE PLACEMENT;  Surgeon: Newman Pies, MD;  Location: [facility] OR;  Service: ENT;  Laterality: Bilateral;   PERIPHERAL VASCULAR BALLOON ANGIOPLASTY Right 05/04/2022   Procedure: PERIPHERAL VASCULAR BALLOON ANGIOPLASTY;  Surgeon: Victorino Sparrow, MD;  Location: [facility] INVASIVE CV LAB;  Service: Cardiovascular;  Laterality: Right;  PT   PERIPHERAL VASCULAR INTERVENTION Right 05/04/2022   Procedure: PERIPHERAL VASCULAR INTERVENTION;  Surgeon: Victorino Sparrow, MD;  Location: [facility] INVASIVE CV LAB;  Service: Cardiovascular;  Laterality: Right;  SFA   PERIPHERAL VASCULAR INTERVENTION Left 07/21/2022   Procedure: PERIPHERAL VASCULAR INTERVENTION;  Surgeon: Nada Libman, MD;  Location: [facility];  Service: Cardiovascular;  Laterality: Left;   REVISION OF ARTERIOVENOUS GORETEX GRAFT Left 05/04/2018   Procedure: TRANSPOSITION OF CEPHALIC VEIN ARTERIOVENOUS FISTULA LEFT ARM;  Surgeon:  Larina Earthly, MD;  Location: [facility] OR;  Service: Vascular;  Laterality: Left;   SAVORY DILATION N/A 12/18/2016   Procedure: SAVORY DILATION;  Surgeon: West Bali, MD;  Location: [facility];  Service: Endoscopy;  Laterality: N/A;   TRANSMETATARSAL AMPUTATION Left 08/15/2022   Procedure: TRANSMETATARSAL AMPUTATION;  Surgeon: [name], DPM;  Location: [facility] OR;  Service: Podiatry;  Laterality: Left;   Social History   Occupational History   Occupation: retired   Tobacco Use   Smoking status: Never    Passive exposure: Never   Smokeless tobacco: Never   Tobacco comments:    Verified by Daughter, [name]  Vaping Use   Vaping status: Never Used  Substance and Sexual Activity   Alcohol use: No   Drug use: No   Sexual activity: Not Currently    Partners: Male

## 2022-10-06 NOTE — Progress Notes (Addendum)
SDW CALL  Patient was given pre-op instructions over the phone. The opportunity was given for the patient to ask questions. No further questions asked. Patient verbalized understanding of instructions given.   PCP - Jessie Foot Cardiologist - Darrold Junker  PPM/ICD - denies Device Orders -  Rep Notified -   Chest x-ray - 08/13/22 EKG - 6/17/234 Stress Test -  ECHO - 05/28/22 Cardiac Cath - 12/29/18  Sleep Study - 10/24/2018 CPAP - does not use  Fasting Blood Sugar - according to pt's daughter,FBS varies widely. She could not give me a number or range.  Checks Blood Sugar 4 times a day  Blood Thinner Instructions:na Aspirin Instructions: hold DOS  ERAS Protcol - clears until 1050 PRE-SURGERY Ensure or G2- no  COVID TEST- no   Anesthesia review: yes- history of CHF, ESRD  Patient denies shortness of breath, fever, cough and chest pain over the phone call    Surgical Instructions    Your procedure is scheduled on July 17  Report to [facility] Main Entrance "A" at 1120 A.M., then check in with the Admitting office.  Call this number if you have problems the morning of surgery:  [phone]    Remember:  Do not eat after midnight the night before your surgery  You may drink clear liquids until 1050 the morning of your surgery.   Clear liquids allowed are: Water, Non-Citrus Juices (without pulp), Carbonated Beverages, Clear Tea, Black Coffee ONLY (NO MILK, CREAM OR POWDERED CREAMER of any kind), and Gatorade   Take these medicines the morning of surgery with A SIP OF WATER:  Cymbalta,Zetia,Sensipar,Lyrica,Zoloft. PRN- Atarax, Percocet  As of today, STOP taking any Aspirin (unless otherwise instructed by your surgeon) Aleve, Naproxen, Ibuprofen, Motrin, Advil, Goody's, BC's, all herbal medications, fish oil, and all vitamins.  WHAT DO I DO ABOUT MY DIABETES MEDICATION?  THE MORNING OF SURGERY, take 15 units of glargine(Toujeo) insulin.  If your CBG is  greater than 220 mg/dL the morning of surgery, you may take  of your sliding scale (correction) dose of Novolog insulin.  Check your blood sugar the morning of your surgery when you wake up and every 2 hours until you get to the Short Stay unit.  If your blood sugar is less than 70 mg/dL, you will need to treat for low blood sugar: Do not take insulin. Treat a low blood sugar (less than 70 mg/dL) with  cup of clear juice (cranberry or apple), 4 glucose tablets, OR glucose gel. Recheck blood sugar in 15 minutes after treatment (to make sure it is greater than 70 mg/dL). If your blood sugar is not greater than 70 mg/dL on recheck, call [phone] for further instructions. Report your blood sugar to the short stay nurse when you get to Short Stay.  [facility] is not responsible for any belongings or valuables. .   Do NOT Smoke (Tobacco/Vaping)  24 hours prior to your procedure  If you use a CPAP at night, you may bring your mask for your overnight stay.   Contacts, glasses, hearing aids, dentures or partials may not be worn into surgery, please bring cases for these belongings   Patients discharged the day of surgery will not be allowed to drive home, and someone needs to stay with them for 24 hours.  Special instructions:    Oral Hygiene is also important to reduce your risk of infection.  Remember - BRUSH YOUR TEETH THE MORNING OF SURGERY WITH YOUR REGULAR TOOTHPASTE  Day of Surgery:  Take a shower the day of or night before with antibacterial soap. Wear Clean/Comfortable clothing the morning of surgery Do not apply any deodorants/lotions.   Do not wear jewelry or makeup Do not wear lotions, powders, perfumes/colognes, or deodorant. Do not shave 48 hours prior to surgery.  Men may shave face and neck. Do not bring valuables to the hospital. Do not wear nail polish, gel polish, artificial nails, or any other type of covering on natural nails (fingers and toes) If you have  artificial nails or gel coating that need to be removed by a nail salon, please have this removed prior to surgery. Artificial nails or gel coating may interfere with anesthesia's ability to adequately monitor your vital signs. Remember to brush your teeth WITH YOUR REGULAR TOOTHPASTE.

## 2022-10-07 ENCOUNTER — Inpatient Hospital Stay (HOSPITAL_COMMUNITY)
Admission: RE | Admit: 2022-10-07 | Discharge: 2022-10-14 | DRG: 463 | Disposition: A | Discharge status: Rehabilitation Facility | Payer: Medicare PPO | Attending: Orthopedic Surgery | Admitting: Orthopedic Surgery

## 2022-10-07 ENCOUNTER — Telehealth: Payer: Self-pay

## 2022-10-07 ENCOUNTER — Other Ambulatory Visit: Payer: Self-pay

## 2022-10-07 ENCOUNTER — Encounter (HOSPITAL_COMMUNITY): Payer: Self-pay | Admitting: Orthopedic Surgery

## 2022-10-07 ENCOUNTER — Inpatient Hospital Stay (HOSPITAL_COMMUNITY): Payer: Self-pay | Admitting: Physician Assistant

## 2022-10-07 ENCOUNTER — Encounter (HOSPITAL_COMMUNITY)
Admission: RE | Disposition: A | Discharge status: Rehabilitation Facility | Payer: Self-pay | Source: Home / Self Care | Attending: Orthopedic Surgery

## 2022-10-07 ENCOUNTER — Inpatient Hospital Stay (HOSPITAL_COMMUNITY): Payer: Medicare PPO | Admitting: Physician Assistant

## 2022-10-07 DIAGNOSIS — D631 Anemia in chronic kidney disease: Secondary | ICD-10-CM | POA: Diagnosis not present

## 2022-10-07 DIAGNOSIS — I120 Hypertensive chronic kidney disease with stage 5 chronic kidney disease or end stage renal disease: Secondary | ICD-10-CM | POA: Diagnosis present

## 2022-10-07 DIAGNOSIS — G8918 Other acute postprocedural pain: Secondary | ICD-10-CM | POA: Diagnosis not present

## 2022-10-07 DIAGNOSIS — R531 Weakness: Secondary | ICD-10-CM | POA: Diagnosis not present

## 2022-10-07 DIAGNOSIS — Z5986 Financial insecurity: Secondary | ICD-10-CM | POA: Diagnosis not present

## 2022-10-07 DIAGNOSIS — Z751 Person awaiting admission to adequate facility elsewhere: Secondary | ICD-10-CM

## 2022-10-07 DIAGNOSIS — I5032 Chronic diastolic (congestive) heart failure: Secondary | ICD-10-CM | POA: Diagnosis not present

## 2022-10-07 DIAGNOSIS — N2581 Secondary hyperparathyroidism of renal origin: Secondary | ICD-10-CM | POA: Diagnosis not present

## 2022-10-07 DIAGNOSIS — F32A Depression, unspecified: Secondary | ICD-10-CM | POA: Diagnosis not present

## 2022-10-07 DIAGNOSIS — Z8261 Family history of arthritis: Secondary | ICD-10-CM | POA: Diagnosis not present

## 2022-10-07 DIAGNOSIS — M62521 Muscle wasting and atrophy, not elsewhere classified, right upper arm: Secondary | ICD-10-CM | POA: Diagnosis not present

## 2022-10-07 DIAGNOSIS — T8781 Dehiscence of amputation stump: Secondary | ICD-10-CM

## 2022-10-07 DIAGNOSIS — Z992 Dependence on renal dialysis: Secondary | ICD-10-CM

## 2022-10-07 DIAGNOSIS — Z6830 Body mass index (BMI) 30.0-30.9, adult: Secondary | ICD-10-CM

## 2022-10-07 DIAGNOSIS — M62562 Muscle wasting and atrophy, not elsewhere classified, left lower leg: Secondary | ICD-10-CM | POA: Diagnosis not present

## 2022-10-07 DIAGNOSIS — M86272 Subacute osteomyelitis, left ankle and foot: Secondary | ICD-10-CM | POA: Diagnosis not present

## 2022-10-07 DIAGNOSIS — Z8616 Personal history of COVID-19: Secondary | ICD-10-CM

## 2022-10-07 DIAGNOSIS — E782 Mixed hyperlipidemia: Secondary | ICD-10-CM | POA: Diagnosis present

## 2022-10-07 DIAGNOSIS — E1143 Type 2 diabetes mellitus with diabetic autonomic (poly)neuropathy: Secondary | ICD-10-CM | POA: Diagnosis not present

## 2022-10-07 DIAGNOSIS — Z803 Family history of malignant neoplasm of breast: Secondary | ICD-10-CM | POA: Diagnosis not present

## 2022-10-07 DIAGNOSIS — Z888 Allergy status to other drugs, medicaments and biological substances status: Secondary | ICD-10-CM

## 2022-10-07 DIAGNOSIS — R2689 Other abnormalities of gait and mobility: Secondary | ICD-10-CM | POA: Diagnosis not present

## 2022-10-07 DIAGNOSIS — I132 Hypertensive heart and chronic kidney disease with heart failure and with stage 5 chronic kidney disease, or end stage renal disease: Secondary | ICD-10-CM

## 2022-10-07 DIAGNOSIS — M869 Osteomyelitis, unspecified: Secondary | ICD-10-CM | POA: Diagnosis not present

## 2022-10-07 DIAGNOSIS — Z4781 Encounter for orthopedic aftercare following surgical amputation: Secondary | ICD-10-CM | POA: Diagnosis not present

## 2022-10-07 DIAGNOSIS — E1122 Type 2 diabetes mellitus with diabetic chronic kidney disease: Secondary | ICD-10-CM | POA: Diagnosis not present

## 2022-10-07 DIAGNOSIS — E1151 Type 2 diabetes mellitus with diabetic peripheral angiopathy without gangrene: Secondary | ICD-10-CM | POA: Diagnosis present

## 2022-10-07 DIAGNOSIS — Z89432 Acquired absence of left foot: Secondary | ICD-10-CM | POA: Diagnosis not present

## 2022-10-07 DIAGNOSIS — Z8673 Personal history of transient ischemic attack (TIA), and cerebral infarction without residual deficits: Secondary | ICD-10-CM

## 2022-10-07 DIAGNOSIS — Z7401 Bed confinement status: Secondary | ICD-10-CM | POA: Diagnosis not present

## 2022-10-07 DIAGNOSIS — Y835 Amputation of limb(s) as the cause of abnormal reaction of the patient, or of later complication, without mention of misadventure at the time of the procedure: Secondary | ICD-10-CM | POA: Diagnosis present

## 2022-10-07 DIAGNOSIS — Z90710 Acquired absence of both cervix and uterus: Secondary | ICD-10-CM

## 2022-10-07 DIAGNOSIS — Z880 Allergy status to penicillin: Secondary | ICD-10-CM | POA: Diagnosis not present

## 2022-10-07 DIAGNOSIS — Z83438 Family history of other disorder of lipoprotein metabolism and other lipidemia: Secondary | ICD-10-CM | POA: Diagnosis not present

## 2022-10-07 DIAGNOSIS — Z89512 Acquired absence of left leg below knee: Secondary | ICD-10-CM

## 2022-10-07 DIAGNOSIS — K219 Gastro-esophageal reflux disease without esophagitis: Secondary | ICD-10-CM | POA: Diagnosis present

## 2022-10-07 DIAGNOSIS — M62561 Muscle wasting and atrophy, not elsewhere classified, right lower leg: Secondary | ICD-10-CM | POA: Diagnosis not present

## 2022-10-07 DIAGNOSIS — S88012A Complete traumatic amputation at knee level, left lower leg, initial encounter: Secondary | ICD-10-CM | POA: Diagnosis not present

## 2022-10-07 DIAGNOSIS — R400 Somnolence: Secondary | ICD-10-CM | POA: Diagnosis not present

## 2022-10-07 DIAGNOSIS — Z8249 Family history of ischemic heart disease and other diseases of the circulatory system: Secondary | ICD-10-CM

## 2022-10-07 DIAGNOSIS — M6281 Muscle weakness (generalized): Secondary | ICD-10-CM | POA: Diagnosis not present

## 2022-10-07 DIAGNOSIS — N186 End stage renal disease: Secondary | ICD-10-CM | POA: Diagnosis present

## 2022-10-07 DIAGNOSIS — G4733 Obstructive sleep apnea (adult) (pediatric): Secondary | ICD-10-CM | POA: Diagnosis not present

## 2022-10-07 DIAGNOSIS — T8130XA Disruption of wound, unspecified, initial encounter: Secondary | ICD-10-CM | POA: Diagnosis not present

## 2022-10-07 DIAGNOSIS — M62522 Muscle wasting and atrophy, not elsewhere classified, left upper arm: Secondary | ICD-10-CM | POA: Diagnosis not present

## 2022-10-07 DIAGNOSIS — E669 Obesity, unspecified: Secondary | ICD-10-CM | POA: Diagnosis present

## 2022-10-07 DIAGNOSIS — G4730 Sleep apnea, unspecified: Secondary | ICD-10-CM | POA: Diagnosis present

## 2022-10-07 DIAGNOSIS — Z89511 Acquired absence of right leg below knee: Secondary | ICD-10-CM | POA: Diagnosis not present

## 2022-10-07 DIAGNOSIS — R41841 Cognitive communication deficit: Secondary | ICD-10-CM | POA: Diagnosis not present

## 2022-10-07 DIAGNOSIS — L97329 Non-pressure chronic ulcer of left ankle with unspecified severity: Secondary | ICD-10-CM | POA: Diagnosis not present

## 2022-10-07 DIAGNOSIS — I739 Peripheral vascular disease, unspecified: Secondary | ICD-10-CM | POA: Diagnosis not present

## 2022-10-07 HISTORY — PX: AMPUTATION: SHX166

## 2022-10-07 HISTORY — DX: Malignant (primary) neoplasm, unspecified: C80.1

## 2022-10-07 LAB — SURGICAL PCR SCREEN
MRSA, PCR: NEGATIVE
Staphylococcus aureus: NEGATIVE

## 2022-10-07 LAB — GLUCOSE, CAPILLARY
Glucose-Capillary: 197 mg/dL — ABNORMAL HIGH (ref 70–99)
Glucose-Capillary: 191 mg/dL — ABNORMAL HIGH (ref 70–99)
Glucose-Capillary: 184 mg/dL — ABNORMAL HIGH (ref 70–99)
Glucose-Capillary: 243 mg/dL — ABNORMAL HIGH (ref 70–99)

## 2022-10-07 LAB — POCT I-STAT, CHEM 8
BUN: 85 mg/dL — ABNORMAL HIGH (ref 8–23)
Calcium, Ion: 1.37 mmol/L (ref 1.15–1.40)
Chloride: 107 mmol/L (ref 98–111)
Creatinine, Ser: 9.8 mg/dL — ABNORMAL HIGH (ref 0.44–1.00)
Glucose, Bld: 217 mg/dL — ABNORMAL HIGH (ref 70–99)
HCT: 35 % — ABNORMAL LOW (ref 36.0–46.0)
Hemoglobin: 11.9 g/dL — ABNORMAL LOW (ref 12.0–15.0)
Potassium: 5.3 mmol/L — ABNORMAL HIGH (ref 3.5–5.1)
Sodium: 139 mmol/L (ref 135–145)
TCO2: 25 mmol/L (ref 22–32)

## 2022-10-07 SURGERY — AMPUTATION BELOW KNEE
Anesthesia: Regional | Site: Knee | Laterality: Left

## 2022-10-07 MED ORDER — POTASSIUM CHLORIDE CRYS ER 20 MEQ PO TBCR: 20.0000 meq | EXTENDED_RELEASE_TABLET | Freq: Every day | ORAL | Status: DC | PRN

## 2022-10-07 MED ORDER — ACETAMINOPHEN 325 MG PO TABS
325.0000 mg | ORAL_TABLET | Freq: Four times a day (QID) | ORAL | Status: DC | PRN
  Administered 2022-10-13: 650 mg via ORAL
  Filled 2022-10-07 (×3): qty 2

## 2022-10-07 MED ORDER — OXYCODONE HCL 5 MG PO TABS
10.0000 mg | ORAL_TABLET | ORAL | Status: DC | PRN
  Administered 2022-10-08: 15 mg via ORAL
  Filled 2022-10-07: qty 3

## 2022-10-07 MED ORDER — CHLORHEXIDINE GLUCONATE CLOTH 2 % EX PADS: 6.0000 | MEDICATED_PAD | Freq: Every day | CUTANEOUS | Status: DC

## 2022-10-07 MED ORDER — INSULIN GLARGINE-YFGN 100 UNIT/ML ~~LOC~~ SOLN
20.0000 [IU] | Freq: Every day | SUBCUTANEOUS | Status: DC
  Administered 2022-10-08 – 2022-10-11 (×4): 20 [IU] via SUBCUTANEOUS
  Filled 2022-10-07 (×6): qty 0.2

## 2022-10-07 MED ORDER — FENTANYL CITRATE (PF) 100 MCG/2ML IJ SOLN: 50.0000 ug | Freq: Once | INTRAMUSCULAR | Status: DC

## 2022-10-07 MED ORDER — ASPIRIN 81 MG PO CHEW
162.0000 mg | CHEWABLE_TABLET | Freq: Every day | ORAL | Status: DC
  Administered 2022-10-07 – 2022-10-12 (×6): 162 mg via ORAL
  Filled 2022-10-07 (×6): qty 2

## 2022-10-07 MED ORDER — METOPROLOL SUCCINATE ER 50 MG PO TB24
50.0000 mg | ORAL_TABLET | ORAL | Status: DC
  Administered 2022-10-08 – 2022-10-13 (×4): 50 mg via ORAL
  Filled 2022-10-07 (×5): qty 1

## 2022-10-07 MED ORDER — EZETIMIBE 10 MG PO TABS
10.0000 mg | ORAL_TABLET | Freq: Every day | ORAL | Status: DC
  Administered 2022-10-08 – 2022-10-13 (×6): 10 mg via ORAL
  Filled 2022-10-07 (×6): qty 1

## 2022-10-07 MED ORDER — INSULIN ASPART 100 UNIT/ML IJ SOLN
0.0000 [IU] | Freq: Three times a day (TID) | INTRAMUSCULAR | Status: DC
  Administered 2022-10-08: 5 [IU] via SUBCUTANEOUS
  Administered 2022-10-09 (×2): 2 [IU] via SUBCUTANEOUS
  Administered 2022-10-10: 1 [IU] via SUBCUTANEOUS
  Administered 2022-10-10 (×2): 2 [IU] via SUBCUTANEOUS
  Administered 2022-10-11: 7 [IU] via SUBCUTANEOUS
  Administered 2022-10-11 – 2022-10-12 (×5): 2 [IU] via SUBCUTANEOUS
  Administered 2022-10-13: 5 [IU] via SUBCUTANEOUS
  Administered 2022-10-13: 2 [IU] via SUBCUTANEOUS
  Administered 2022-10-14: 1 [IU] via SUBCUTANEOUS

## 2022-10-07 MED ORDER — SODIUM CHLORIDE 0.9 % IV SOLN: INTRAVENOUS | Status: DC

## 2022-10-07 MED ORDER — ORAL CARE MOUTH RINSE: 15.0000 mL | Freq: Once | OROMUCOSAL | Status: AC

## 2022-10-07 MED ORDER — BUPIVACAINE HCL (PF) 0.5 % IJ SOLN
INTRAMUSCULAR | Status: DC | PRN
  Administered 2022-10-07: 30 mL via PERINEURAL

## 2022-10-07 MED ORDER — PROPOFOL 10 MG/ML IV BOLUS
INTRAVENOUS | Status: DC | PRN
Start: 2022-10-07 — End: 2022-10-07
  Administered 2022-10-07: 100 mg via INTRAVENOUS
  Administered 2022-10-07: 20 mg via INTRAVENOUS

## 2022-10-07 MED ORDER — ALUM & MAG HYDROXIDE-SIMETH 200-200-20 MG/5ML PO SUSP: 15.0000 mL | ORAL | Status: DC | PRN

## 2022-10-07 MED ORDER — DULOXETINE HCL 60 MG PO CPEP
60.0000 mg | ORAL_CAPSULE | Freq: Every day | ORAL | Status: DC
  Administered 2022-10-08 – 2022-10-13 (×6): 60 mg via ORAL
  Filled 2022-10-07 (×6): qty 1

## 2022-10-07 MED ORDER — OXYCODONE HCL 5 MG PO TABS: 5.0000 mg | ORAL_TABLET | Freq: Once | ORAL | Status: DC | PRN

## 2022-10-07 MED ORDER — SERTRALINE HCL 50 MG PO TABS
50.0000 mg | ORAL_TABLET | Freq: Every day | ORAL | Status: DC
  Administered 2022-10-08 – 2022-10-13 (×6): 50 mg via ORAL
  Filled 2022-10-07 (×6): qty 1

## 2022-10-07 MED ORDER — CINACALCET HCL 30 MG PO TABS
90.0000 mg | ORAL_TABLET | ORAL | Status: DC
  Administered 2022-10-07: 90 mg via ORAL
  Filled 2022-10-07: qty 3

## 2022-10-07 MED ORDER — ZINC SULFATE 220 (50 ZN) MG PO CAPS
220.0000 mg | ORAL_CAPSULE | Freq: Every day | ORAL | Status: DC
  Administered 2022-10-07 – 2022-10-13 (×7): 220 mg via ORAL
  Filled 2022-10-07 (×8): qty 1

## 2022-10-07 MED ORDER — CHLORHEXIDINE GLUCONATE 0.12 % MT SOLN
15.0000 mL | Freq: Once | OROMUCOSAL | Status: AC
  Administered 2022-10-07: 15 mL via OROMUCOSAL
  Filled 2022-10-07: qty 15

## 2022-10-07 MED ORDER — CALCITRIOL 0.25 MCG PO CAPS
0.2500 ug | ORAL_CAPSULE | Freq: Two times a day (BID) | ORAL | Status: DC
  Administered 2022-10-07 – 2022-10-11 (×8): 0.25 ug via ORAL
  Filled 2022-10-07 (×8): qty 1

## 2022-10-07 MED ORDER — ONDANSETRON HCL 4 MG/2ML IJ SOLN: 4.0000 mg | Freq: Four times a day (QID) | INTRAMUSCULAR | Status: DC | PRN

## 2022-10-07 MED ORDER — ONDANSETRON HCL 4 MG/2ML IJ SOLN
INTRAMUSCULAR | Status: DC | PRN
Start: 2022-10-07 — End: 2022-10-07
  Administered 2022-10-07: 4 mg via INTRAVENOUS

## 2022-10-07 MED ORDER — MAGNESIUM CITRATE PO SOLN: 1.0000 | Freq: Once | ORAL | Status: DC | PRN

## 2022-10-07 MED ORDER — LIDOCAINE 2% (20 MG/ML) 5 ML SYRINGE
INTRAMUSCULAR | Status: DC | PRN
  Administered 2022-10-07: 40 mg via INTRAVENOUS

## 2022-10-07 MED ORDER — INSULIN ASPART 100 UNIT/ML IJ SOLN
3.0000 [IU] | Freq: Three times a day (TID) | INTRAMUSCULAR | Status: DC
  Administered 2022-10-08 – 2022-10-14 (×18): 3 [IU] via SUBCUTANEOUS

## 2022-10-07 MED ORDER — DEXAMETHASONE SODIUM PHOSPHATE 10 MG/ML IJ SOLN
INTRAMUSCULAR | Status: AC
  Filled 2022-10-07: qty 1

## 2022-10-07 MED ORDER — HYDROMORPHONE HCL 1 MG/ML IJ SOLN
0.5000 mg | INTRAMUSCULAR | Status: DC | PRN
  Administered 2022-10-07 – 2022-10-08 (×3): 1 mg via INTRAVENOUS
  Filled 2022-10-07 (×3): qty 1

## 2022-10-07 MED ORDER — PHENYLEPHRINE 80 MCG/ML (10ML) SYRINGE FOR IV PUSH (FOR BLOOD PRESSURE SUPPORT)
PREFILLED_SYRINGE | INTRAVENOUS | Status: DC | PRN
  Administered 2022-10-07 (×2): 80 ug via INTRAVENOUS

## 2022-10-07 MED ORDER — EPHEDRINE SULFATE-NACL 50-0.9 MG/10ML-% IV SOSY
PREFILLED_SYRINGE | INTRAVENOUS | Status: DC | PRN
  Administered 2022-10-07 (×2): 5 mg via INTRAVENOUS

## 2022-10-07 MED ORDER — PROPOFOL 10 MG/ML IV BOLUS
INTRAVENOUS | Status: AC
  Filled 2022-10-07: qty 20

## 2022-10-07 MED ORDER — METOPROLOL TARTRATE 5 MG/5ML IV SOLN: 2.0000 mg | INTRAVENOUS | Status: DC | PRN

## 2022-10-07 MED ORDER — OXYCODONE HCL 5 MG PO TABS
5.0000 mg | ORAL_TABLET | ORAL | Status: DC | PRN
  Filled 2022-10-07: qty 1

## 2022-10-07 MED ORDER — ONDANSETRON HCL 4 MG/2ML IJ SOLN: 4.0000 mg | Freq: Once | INTRAMUSCULAR | Status: DC | PRN

## 2022-10-07 MED ORDER — DOCUSATE SODIUM 100 MG PO CAPS
100.0000 mg | ORAL_CAPSULE | Freq: Every day | ORAL | Status: DC
  Administered 2022-10-08 – 2022-10-13 (×6): 100 mg via ORAL
  Filled 2022-10-07 (×6): qty 1

## 2022-10-07 MED ORDER — SUCROFERRIC OXYHYDROXIDE 500 MG PO CHEW
1000.0000 mg | CHEWABLE_TABLET | Freq: Three times a day (TID) | ORAL | Status: DC
  Administered 2022-10-08 – 2022-10-14 (×19): 1000 mg via ORAL
  Filled 2022-10-07 (×19): qty 2

## 2022-10-07 MED ORDER — PHENOL 1.4 % MT LIQD: 1.0000 | OROMUCOSAL | Status: DC | PRN

## 2022-10-07 MED ORDER — DEXAMETHASONE SODIUM PHOSPHATE 10 MG/ML IJ SOLN
INTRAMUSCULAR | Status: DC | PRN
  Administered 2022-10-07: 5 mg via INTRAVENOUS

## 2022-10-07 MED ORDER — LIDOCAINE 2% (20 MG/ML) 5 ML SYRINGE
INTRAMUSCULAR | Status: AC
  Filled 2022-10-07: qty 5

## 2022-10-07 MED ORDER — MAGNESIUM SULFATE 2 GM/50ML IV SOLN: 2.0000 g | Freq: Every day | INTRAVENOUS | Status: DC | PRN

## 2022-10-07 MED ORDER — GUAIFENESIN-DM 100-10 MG/5ML PO SYRP: 15.0000 mL | ORAL_SOLUTION | ORAL | Status: DC | PRN

## 2022-10-07 MED ORDER — TRANEXAMIC ACID-NACL 1000-0.7 MG/100ML-% IV SOLN
1000.0000 mg | INTRAVENOUS | Status: AC
  Administered 2022-10-07: 1000 mg via INTRAVENOUS
  Filled 2022-10-07: qty 100

## 2022-10-07 MED ORDER — 0.9 % SODIUM CHLORIDE (POUR BTL) OPTIME
TOPICAL | Status: DC | PRN
  Administered 2022-10-07: 1000 mL

## 2022-10-07 MED ORDER — TRANEXAMIC ACID 1000 MG/10ML IV SOLN
2000.0000 mg | INTRAVENOUS | Status: DC
  Filled 2022-10-07: qty 20

## 2022-10-07 MED ORDER — CEFAZOLIN SODIUM-DEXTROSE 2-4 GM/100ML-% IV SOLN: 2.0000 g | Freq: Three times a day (TID) | INTRAVENOUS | Status: DC

## 2022-10-07 MED ORDER — LABETALOL HCL 5 MG/ML IV SOLN: 10.0000 mg | INTRAVENOUS | Status: DC | PRN

## 2022-10-07 MED ORDER — HYDRALAZINE HCL 20 MG/ML IJ SOLN
5.0000 mg | INTRAMUSCULAR | Status: DC | PRN
  Administered 2022-10-07: 5 mg via INTRAVENOUS
  Filled 2022-10-07: qty 1

## 2022-10-07 MED ORDER — CINACALCET HCL 30 MG PO TABS
90.0000 mg | ORAL_TABLET | ORAL | Status: DC
  Administered 2022-10-09 – 2022-10-14 (×3): 90 mg via ORAL
  Filled 2022-10-07 (×3): qty 3

## 2022-10-07 MED ORDER — INSULIN ASPART 100 UNIT/ML IJ SOLN
0.0000 [IU] | INTRAMUSCULAR | Status: DC | PRN
  Administered 2022-10-07: 2 [IU] via SUBCUTANEOUS
  Filled 2022-10-07 (×2): qty 1

## 2022-10-07 MED ORDER — KETOTIFEN FUMARATE 0.035 % OP SOLN
1.0000 [drp] | Freq: Two times a day (BID) | OPHTHALMIC | Status: DC
  Administered 2022-10-08 – 2022-10-13 (×12): 1 [drp] via OPHTHALMIC
  Filled 2022-10-07: qty 5

## 2022-10-07 MED ORDER — HYDROMORPHONE HCL 1 MG/ML IJ SOLN
INTRAMUSCULAR | Status: AC
  Filled 2022-10-07: qty 1

## 2022-10-07 MED ORDER — VITAMIN C 500 MG PO TABS
1000.0000 mg | ORAL_TABLET | Freq: Every day | ORAL | Status: DC
  Administered 2022-10-07 – 2022-10-13 (×7): 1000 mg via ORAL
  Filled 2022-10-07 (×7): qty 2

## 2022-10-07 MED ORDER — PANTOPRAZOLE SODIUM 40 MG PO TBEC
40.0000 mg | DELAYED_RELEASE_TABLET | Freq: Every day | ORAL | Status: DC
  Administered 2022-10-07 – 2022-10-13 (×7): 40 mg via ORAL
  Filled 2022-10-07 (×7): qty 1

## 2022-10-07 MED ORDER — SUCROFERRIC OXYHYDROXIDE 500 MG PO CHEW: 500.0000 mg | CHEWABLE_TABLET | Freq: Three times a day (TID) | ORAL | Status: DC | PRN

## 2022-10-07 MED ORDER — JUVEN PO PACK
1.0000 | PACK | Freq: Two times a day (BID) | ORAL | Status: DC
  Administered 2022-10-08: 1 via ORAL
  Filled 2022-10-07 (×4): qty 1

## 2022-10-07 MED ORDER — CEFAZOLIN SODIUM-DEXTROSE 2-4 GM/100ML-% IV SOLN
2.0000 g | INTRAVENOUS | Status: AC
  Administered 2022-10-07: 2 g via INTRAVENOUS
  Filled 2022-10-07: qty 100

## 2022-10-07 MED ORDER — ONDANSETRON HCL 4 MG/2ML IJ SOLN
INTRAMUSCULAR | Status: AC
  Filled 2022-10-07: qty 2

## 2022-10-07 MED ORDER — CALCITRIOL 0.5 MCG PO CAPS
3.0000 ug | ORAL_CAPSULE | ORAL | Status: DC
  Administered 2022-10-09 – 2022-10-14 (×3): 3 ug via ORAL
  Filled 2022-10-07 (×3): qty 6

## 2022-10-07 MED ORDER — PREGABALIN 25 MG PO CAPS
50.0000 mg | ORAL_CAPSULE | Freq: Three times a day (TID) | ORAL | Status: DC
  Administered 2022-10-08 – 2022-10-13 (×18): 50 mg via ORAL
  Filled 2022-10-07 (×17): qty 2

## 2022-10-07 MED ORDER — POLYETHYLENE GLYCOL 3350 17 G PO PACK: 17.0000 g | PACK | Freq: Every day | ORAL | Status: DC | PRN

## 2022-10-07 MED ORDER — OXYCODONE HCL 5 MG/5ML PO SOLN: 5.0000 mg | Freq: Once | ORAL | Status: DC | PRN

## 2022-10-07 MED ORDER — FENTANYL CITRATE (PF) 100 MCG/2ML IJ SOLN
INTRAMUSCULAR | Status: AC
  Administered 2022-10-07: 50 ug
  Filled 2022-10-07: qty 2

## 2022-10-07 MED ORDER — BISACODYL 5 MG PO TBEC: 5.0000 mg | DELAYED_RELEASE_TABLET | Freq: Every day | ORAL | Status: DC | PRN

## 2022-10-07 MED ORDER — HYDROMORPHONE HCL 1 MG/ML IJ SOLN
0.2500 mg | INTRAMUSCULAR | Status: DC | PRN
  Administered 2022-10-07: 0.5 mg via INTRAVENOUS

## 2022-10-07 SURGICAL SUPPLY — 41 items
BAG COUNTER SPONGE SURGICOUNT (BAG) IMPLANT
BAG SPNG CNTER NS LX DISP (BAG)
BLADE SAW RECIP 87.9 MT (BLADE) ×1 IMPLANT
BLADE SURG 21 STRL SS (BLADE) ×1 IMPLANT
BNDG CMPR 5X6 CHSV STRCH STRL (GAUZE/BANDAGES/DRESSINGS)
BNDG COHESIVE 6X5 TAN ST LF (GAUZE/BANDAGES/DRESSINGS) IMPLANT
CANISTER WOUND CARE 500ML ATS (WOUND CARE) ×1 IMPLANT
COVER SURGICAL LIGHT HANDLE (MISCELLANEOUS) ×1 IMPLANT
CUFF TOURN SGL QUICK 34 (TOURNIQUET CUFF)
CUFF TOURN SGL QUICK 42 (TOURNIQUET CUFF) IMPLANT
CUFF TRNQT CYL 34X4.125X (TOURNIQUET CUFF) ×1 IMPLANT
DRAPE DERMATAC (DRAPES) IMPLANT
DRAPE INCISE IOBAN 66X45 STRL (DRAPES) ×1 IMPLANT
DRAPE U-SHAPE 47X51 STRL (DRAPES) ×1 IMPLANT
DRESSING PREVENA PLUS CUSTOM (GAUZE/BANDAGES/DRESSINGS) ×1 IMPLANT
DRSG PREVENA PLUS CUSTOM (GAUZE/BANDAGES/DRESSINGS) ×1
DURAPREP 26ML APPLICATOR (WOUND CARE) ×1 IMPLANT
ELECT REM PT RETURN 9FT ADLT (ELECTROSURGICAL) ×1
ELECTRODE REM PT RTRN 9FT ADLT (ELECTROSURGICAL) ×1 IMPLANT
GLOVE BIOGEL PI IND STRL 9 (GLOVE) ×1 IMPLANT
GLOVE SURG ORTHO 9.0 STRL STRW (GLOVE) ×1 IMPLANT
GOWN STRL REUS W/ TWL XL LVL3 (GOWN DISPOSABLE) ×2 IMPLANT
GOWN STRL REUS W/TWL XL LVL3 (GOWN DISPOSABLE) ×2
GRAFT SKIN WND MICRO 38 (Tissue) IMPLANT
KIT BASIN OR (CUSTOM PROCEDURE TRAY) ×1 IMPLANT
KIT TURNOVER KIT B (KITS) ×1 IMPLANT
MANIFOLD NEPTUNE II (INSTRUMENTS) ×1 IMPLANT
NS IRRIG 1000ML POUR BTL (IV SOLUTION) ×1 IMPLANT
PACK ORTHO EXTREMITY (CUSTOM PROCEDURE TRAY) ×1 IMPLANT
PAD ARMBOARD 7.5X6 YLW CONV (MISCELLANEOUS) ×1 IMPLANT
PREVENA RESTOR ARTHOFORM 46X30 (CANNISTER) ×1 IMPLANT
SPONGE T-LAP 18X18 ~~LOC~~+RFID (SPONGE) IMPLANT
STAPLER VISISTAT 35W (STAPLE) IMPLANT
STOCKINETTE IMPERVIOUS LG (DRAPES) ×1 IMPLANT
SUT ETHILON 2 0 PSLX (SUTURE) IMPLANT
SUT SILK 2 0 (SUTURE) ×1
SUT SILK 2-0 18XBRD TIE 12 (SUTURE) ×1 IMPLANT
SUT VIC AB 1 CTX 27 (SUTURE) ×2 IMPLANT
TOWEL GREEN STERILE (TOWEL DISPOSABLE) ×1 IMPLANT
TUBE CONNECTING 12X1/4 (SUCTIONS) ×1 IMPLANT
YANKAUER SUCT BULB TIP NO VENT (SUCTIONS) ×1 IMPLANT

## 2022-10-07 NOTE — Transfer of Care (Signed)
Immediate Anesthesia Transfer of Care Note  Patient: Kathryn Beck  Procedure(s) Performed: LEFT BELOW KNEE AMPUTATION (Left: Knee)  Patient Location: PACU  Anesthesia Type:General and Regional  Level of Consciousness: sedated  Airway & Oxygen Therapy: Patient Spontanous Breathing and Patient connected to nasal cannula oxygen  Post-op Assessment: Report given to RN and Post -op Vital signs reviewed and stable  Post vital signs: Reviewed and stable  Last Vitals:  Vitals Value Taken Time  BP    Temp    Pulse    Resp    SpO2      Last Pain:  Vitals:   10/07/22 1221  PainSc: 0-No pain         Complications: No notable events documented.

## 2022-10-07 NOTE — Anesthesia Procedure Notes (Addendum)
  Anesthesia Regional Block: Femoral nerve block   Pre-Anesthetic Checklist: , timeout performed,  Correct Patient, Correct Site, Correct Laterality,  Correct Procedure, Correct Position, site marked,  Risks and benefits discussed,  Surgical consent,  Pre-op evaluation,  At surgeon's request and post-op pain management  Laterality: Left  Prep: chloraprep       Needles:  Injection technique: Single-shot  Needle Type: Echogenic Stimulator Needle      Needle Gauge: 21   Needle insertion depth: 7 cm   Additional Needles:   Procedures:,,,, ultrasound used (permanent image in chart),,    Narrative:  Start time: 10/07/2022 12:50 PM End time: 10/07/2022 12:55 PM Injection made incrementally with aspirations every 5 mL.  Performed by: Personally  Anesthesiologist: Mal Amabile, MD  Additional Notes: Timeout performed. Patient sedated. Relevant anatomy ID'd using [place]. Incremental 2-40ml injection of LA with frequent aspiration. Patient tolerated procedure well.

## 2022-10-07 NOTE — Inpatient Diabetes Management (Signed)
Inpatient Diabetes Program Recommendations  AACE/ADA: New Consensus Statement on Inpatient Glycemic Control   Target Ranges:  Prepandial:   less than 140 mg/dL      Peak postprandial:   less than 180 mg/dL (1-2 hours)      Critically ill patients:  140 - 180 mg/dL    Latest Reference Range & Units 10/07/22 11:21  Glucose-Capillary 70 - 99 mg/dL 272 (H)   Review of Glycemic Control  Diabetes history: DM2 Outpatient Diabetes medications: Toujeo 30 units daily, Fiasp 10-20 units with meals plus SSI; FreeStyle Libre2 Current orders for Inpatient glycemic control: Novolog 0-7 units Q2H PRN  Inpatient Diabetes Program Recommendations:    Insulin: If patient is admitted, please consider ordering Semglee 15 units Q24H, CBGs AC&HS, Novolog 0-9 units TID with meals, Novolog 0-5 units at bedtime, and Novolog 3 units TID with meals for meal coverage if patient eats at least 50% of meals.  NOTE: Patient currently in OR for BKA. In reviewing chart, noted patient sees Dr. Elvera Lennox (Endocrinologist) and was last seen on 08/11/22 and per office note patient was prescribed Toujeo 30 units daily, Fiasp 10-20 units with meals plus Fiasp correction scale.   Thanks, Orlando Penner, RN, MSN, CDCES Diabetes Coordinator Inpatient Diabetes Program [phone] (Team Pager from 8am to 5pm)

## 2022-10-07 NOTE — Consult Note (Signed)
ESRD Consult Note  Requesting provider: Dr. Lajoyce Corners  Reason for consult: ESRD, provision of dialysis  Assessment/Recommendations:  ESRD -outpatient HD orders (per previous admit): MWF at [facility] [place] 3:45hr, 400/500, EDW 80g, 2K/2.5Ca, UFP#2, LUE AVF 15g, heparin 1000 unit initial + 1000 unit/hr Meds: Mircera 60 mcg every 2 weeks (last dose 7/8), Venofer 50 mg once weekly, calcitriol 3 mcg every treatment, Sensipar 90 mg every treatment -HD today, keep on MWF sched. 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 Labs and imaging were independently interpreted to aid in clinical decision making. Findings and plan were conveyed to the primary team.  Anthony Sar, MD [facility]  History of Present Illness: Kathryn Beck is a/an 67 y.o. female with a past medical history of ESRD, DM2, hypertension, history of CVA, PVD status post right BKA, recent left foot osteomyelitis who presents left transtibial amputation which was done today. Done due to wound dehiscence. Patient seen and examined in PACU. She's currently drowsy post-op but she does report that dialysis has been going okay and hasn't had any issues with her access.   Medications:  Current Facility-Administered Medications  Medication Dose Route Frequency Provider Last Rate Last Admin   0.9 %  sodium chloride infusion   Intravenous Continuous Mal Amabile, MD 10 mL/hr at 10/07/22 1227 New Bag at 10/07/22 1227   [START ON 10/08/2022] Chlorhexidine Gluconate Cloth 2 % PADS 6 each  6 each Topical Q0600 Penninger, Lillia Abed, PA       fentaNYL  (SUBLIMAZE) injection 50 mcg  50 mcg Intravenous Once Mal Amabile, MD       HYDROmorphone (DILAUDID) 1 MG/ML injection            HYDROmorphone (DILAUDID) injection 0.25-0.5 mg  0.25-0.5 mg Intravenous Q5 min PRN Mal Amabile, MD   0.5 mg at 10/07/22 1449   insulin aspart (novoLOG) injection 0-7 Units  0-7 Units Subcutaneous Q2H PRN Mal Amabile, MD   2 Units at 10/07/22 1228   ondansetron (ZOFRAN) injection 4 mg  4 mg Intravenous Once PRN Mal Amabile, MD       oxyCODONE (Oxy IR/ROXICODONE) immediate release tablet 5 mg  5 mg Oral Once PRN Mal Amabile, MD       Or   oxyCODONE (ROXICODONE) 5 MG/5ML solution 5 mg  5 mg Oral Once PRN Mal Amabile, MD       tranexamic acid (CYKLOKAPRON) 2,000 mg in sodium chloride 0.9 % 50 mL Topical Application  2,000 mg Topical To OR [name], MD       Facility-Administered Medications Ordered in Other Encounters  Medication Dose Route Frequency Provider Last Rate Last Admin   0.9 %  sodium chloride infusion   Intravenous Continuous Lockamy, Randi L, NP-C   Stopped at 10/07/22 1422     ALLERGIES Ace inhibitors, Penicillins, Statins, and Albuterol  MEDICAL HISTORY Past Medical History:  Diagnosis Date   Acid reflux    Anemia    Arthritis    Axillary masses    Soft tissue - status post excision   Back pain    Cancer (HCC)    CHF (congestive  heart failure) (HCC)    COVID-19 virus infection 04/06/2019   Depression    End-stage renal disease (HCC)    M/W/F dialysis   Essential hypertension    Headache    years ago   History of blood transfusion    History of cardiac catheterization    Normal coronary arteries October 2020   History of claustrophobia    History of pneumonia 2019   Hypoxia 04/03/2019   Memory loss    Mixed hyperlipidemia    Obesity    Pancreatitis    Peritoneal dialysis catheter in place [facility])    Pneumonia due to COVID-19 virus 04/02/2019   Sleep apnea    Noncompliant with CPAP   Stroke (HCC)     mini stroke   Type 2 diabetes mellitus (HCC)      SOCIAL HISTORY Social History   Socioeconomic History   Marital status: Married    Spouse name: Esha Fincher   Number of children: 2   Years of education: 12   Highest education level: 12th grade  Occupational History   Occupation: retired   Tobacco Use   Smoking status: Never    Passive exposure: Never   Smokeless tobacco: Never   Tobacco comments:    Verified by Daughter, [name]  Vaping Use   Vaping status: Never Used  Substance and Sexual Activity   Alcohol use: No   Drug use: No   Sexual activity: Not Currently    Partners: Male  Other Topics Concern   Not on file  Social History Narrative   Lives alone with husband    Social Determinants of Health   Financial Resource Strain: Medium Risk (05/12/2022)   Overall Financial Resource Strain (CARDIA)    Difficulty of Paying Living Expenses: Somewhat hard  Food Insecurity: No Food Insecurity (09/03/2022)   Hunger Vital Sign    Worried About Running Out of Food in the Last Year: Never true    Ran Out of Food in the Last Year: Never true  Transportation Needs: No Transportation Needs (09/03/2022)   PRAPARE - Administrator, Civil Service (Medical): No    Lack of Transportation (Non-Medical): No  Physical Activity: Inactive (05/12/2022)   Exercise Vital Sign    Days of Exercise per Week: 0 days    Minutes of Exercise per Session: 0 min  Stress: No Stress Concern Present (05/12/2022)   [employer] of Occupational Health - Occupational Stress Questionnaire    Feeling of Stress : Not at all  Social Connections: Socially Integrated (05/12/2022)   Social Connection and Isolation Panel [NHANES]    Frequency of Communication with Friends and Family: More than three times a week    Frequency of Social Gatherings with Friends and Family: More than three times a week    Attends Religious Services: More than 4 times per year    Active Member of [employer] or  Organizations: Yes    Attends Banker Meetings: 1 to 4 times per year    Marital Status: Married  Catering manager Violence: Not At Risk (09/03/2022)   Humiliation, Afraid, Rape, and Kick questionnaire    Fear of Current or Ex-Partner: No    Emotionally Abused: No    Physically Abused: No    Sexually Abused: No     FAMILY HISTORY Family History  Problem Relation Age of Onset   Hypertension Father    Hypercholesterolemia Father    Arthritis Father    Hypertension Sister  Hypercholesterolemia Sister    Breast cancer Sister    Hypertension Sister    Colon cancer Neg Hx    Colon polyps Neg Hx      Review of Systems: 12 systems were reviewed and negative except per HPI  Physical Exam: Vitals:   10/07/22 1515 10/07/22 1530  BP: (!) 166/70 (!) 167/72  Pulse: 64 64  Resp: 12 10  Temp:    SpO2: 99% 93%   Total I/O In: -  Out: 50 [Blood:50]  Intake/Output Summary (Last 24 hours) at 10/07/2022 1542 Last data filed at 10/07/2022 1419 Gross per 24 hour  Intake --  Output 50 ml  Net -50 ml   General: drowsy, no acute distress HEENT: anicteric sclera, MMM CV: normal rate, +systolic murmur Lungs: cta b/l bilateral chest rise, normal wob Abd: soft, non-tender, non-distended Msk: b/l BKA, dressings in place Neuro: drowsy but following commands Dialysis access: LUE AVF +b/t  Test Results Reviewed Lab Results  Component Value Date   NA 139 10/07/2022   K 5.3 (H) 10/07/2022   CL 107 10/07/2022   CO2 25 10/01/2022   BUN 85 (H) 10/07/2022   CREATININE 9.80 (H) 10/07/2022   GLU 211 07/17/2019   CALCIUM 9.5 10/01/2022   ALBUMIN 3.6 10/01/2022   PHOS 4.6 09/08/2022    I have reviewed relevant outside healthcare records

## 2022-10-07 NOTE — Anesthesia Postprocedure Evaluation (Signed)
Anesthesia Post Note  Patient: Kathryn Beck  Procedure(s) Performed: LEFT BELOW KNEE AMPUTATION (Left: Knee)     Patient location during evaluation: PACU Anesthesia Type: General Level of consciousness: awake and alert and oriented Pain management: pain level controlled Vital Signs Assessment: post-procedure vital signs reviewed and stable Respiratory status: spontaneous breathing, nonlabored ventilation and respiratory function stable Cardiovascular status: blood pressure returned to baseline and stable Postop Assessment: no apparent nausea or vomiting Anesthetic complications: no   No notable events documented.  Last Vitals:  Vitals:   10/07/22 1615 10/07/22 1638  BP: (!) 179/74 (!) 177/76  Pulse: 67 68  Resp: 11 17  Temp: 36.7 C   SpO2: 94% 95%    Last Pain:  Vitals:   10/07/22 1515  PainSc: 3                  Niccolas Loeper A.

## 2022-10-07 NOTE — Telephone Encounter (Signed)
Transition Care Management Follow-up Telephone Call Date of discharge and from where: [facility] 7/11 How have you been since you were released from the hospital? Doing ok but having surgery this morning to take off other foot Any questions or concerns? No  Items Reviewed: Did the pt receive and understand the discharge instructions provided? Yes  Medications obtained and verified? Yes  Other? No  Any new allergies since your discharge? No  Dietary orders reviewed? No Do you have support at home? Yes     Follow up appointments reviewed:  PCP Hospital f/u appt confirmed? Yes  Scheduled to see  on  @ . Specialist Hospital f/u appt confirmed? Yes  Scheduled to see  on  @ . Are transportation arrangements needed? No  If their condition worsens, is the pt aware to call PCP or go to the Emergency Dept.? Yes Was the patient provided with contact information for the PCP's office or ED? Yes Was to pt encouraged to call back with questions or concerns? Yes

## 2022-10-07 NOTE — Anesthesia Procedure Notes (Signed)
Procedure Name: LMA Insertion Date/Time: 10/07/2022 1:55 PM  Performed by: Gus Puma, CRNAPre-anesthesia Checklist: Patient identified, Emergency Drugs available, Suction available, Patient being monitored and Timeout performed Patient Re-evaluated:Patient Re-evaluated prior to induction Oxygen Delivery Method: Circle system utilized Preoxygenation: Pre-oxygenation with 100% oxygen Induction Type: IV induction LMA: LMA inserted LMA Size: 4.0 Placement Confirmation: positive ETCO2, CO2 detector and breath sounds checked- equal and bilateral Tube secured with: Tape Comments: Inserted by Morrie Sheldon, SRNA

## 2022-10-07 NOTE — Anesthesia Preprocedure Evaluation (Addendum)
Anesthesia Evaluation  Patient identified by MRN, date of birth, ID band Patient awake    Reviewed: Allergy & Precautions, NPO status , Patient's Chart, lab work & pertinent test results, reviewed documented beta blocker date and time   Airway Mallampati: IV  TM Distance: >3 FB Neck ROM: Full    Dental  (+) Dental Advisory Given, Teeth Intact   Pulmonary sleep apnea (noncompliant w/ cpap, in the process of getting a new one)    Pulmonary exam normal breath sounds clear to auscultation       Cardiovascular hypertension, Pt. on medications and Pt. on home beta blockers + Peripheral Vascular Disease and +CHF (grade 2 diastolic dysfunction)  Normal cardiovascular exam+ Valvular Problems/Murmurs (mild AS) AS  Rhythm:Regular Rate:Normal  Echo 05/2022  1. Left ventricular ejection fraction, by estimation, is 60 to 65%. The  left ventricle has normal function. The left ventricle has no regional  wall motion abnormalities. There is moderate concentric left ventricular  hypertrophy. Left ventricular  diastolic parameters are consistent with Grade II diastolic dysfunction  (pseudonormalization). Elevated left ventricular end-diastolic pressure.   2. Right ventricular systolic function is normal. The right ventricular  size is mildly enlarged. There is normal pulmonary artery systolic  pressure.   3. Left atrial size was moderately dilated.   4. Right atrial size was mildly dilated.   5. The mitral valve is grossly normal. Trivial mitral valve  regurgitation. The mean mitral valve gradient is 4.0 mmHg. Moderate mitral  annular calcification.   6. The aortic valve is tricuspid. There is moderate calcification of the  aortic valve. There is mild thickening of the aortic valve. Aortic valve  regurgitation is trivial. Mild aortic valve stenosis.   7. The inferior vena cava is normal in size with greater than 50%  respiratory variability,  suggesting right atrial pressure of 3 mmHg.      Neuro/Psych  Headaches PSYCHIATRIC DISORDERS  Depression     Neuromuscular disease CVA, No Residual Symptoms    GI/Hepatic Neg liver ROS,GERD  Controlled,,  Endo/Other  diabetes, Poorly Controlled, Type 2, Insulin Dependent  FS 213 this AM, s/p 2 units of insulin A1c 9.4  Renal/GU ESRF and DialysisRenal diseaseLast dialysis 7/15  negative genitourinary   Musculoskeletal  (+) Arthritis , Osteoarthritis,  Dehiscence of left TMA    Abdominal  (+) + obese  Peds  Hematology  (+) Blood dyscrasia, anemia   Anesthesia Other Findings   Reproductive/Obstetrics negative OB ROS                             Anesthesia Physical Anesthesia Plan  ASA: 3  Anesthesia Plan: General and Regional   Post-op Pain Management: Tylenol PO (pre-op)*, Minimal or no pain anticipated and Regional block*   Induction: Intravenous  PONV Risk Score and Plan: 3 and Ondansetron, Dexamethasone, Midazolam and Treatment may vary due to age or medical condition  Airway Management Planned: LMA  Additional Equipment: None  Intra-op Plan:   Post-operative Plan: Extubation in OR  Informed Consent: I have reviewed the patients History and Physical, chart, labs and discussed the procedure including the risks, benefits and alternatives for the proposed anesthesia with the patient or authorized representative who has indicated his/her understanding and acceptance.     Dental advisory given  Plan Discussed with: CRNA and Anesthesiologist  Anesthesia Plan Comments:         Anesthesia Quick Evaluation

## 2022-10-07 NOTE — Plan of Care (Signed)
  Problem: Education: Goal: Ability to describe self-care measures that may prevent or decrease complications (Diabetes Survival Skills Education) will improve Outcome: Progressing Goal: Individualized Educational Video(s) Outcome: Progressing   Problem: Coping: Goal: Ability to adjust to condition or change in health will improve Outcome: Progressing   Problem: Fluid Volume: Goal: Ability to maintain a balanced intake and output will improve Outcome: Progressing   Problem: Health Behavior/Discharge Planning: Goal: Ability to identify and utilize available resources and services will improve Outcome: Progressing Goal: Ability to manage health-related needs will improve Outcome: Progressing   Problem: Metabolic: Goal: Ability to maintain appropriate glucose levels will improve Outcome: Progressing   Problem: Nutritional: Goal: Maintenance of adequate nutrition will improve Outcome: Progressing Goal: Progress toward achieving an optimal weight will improve Outcome: Progressing   Problem: Skin Integrity: Goal: Risk for impaired skin integrity will decrease Outcome: Progressing   Problem: Tissue Perfusion: Goal: Adequacy of tissue perfusion will improve Outcome: Progressing   Problem: Education: Goal: Knowledge of the prescribed therapeutic regimen will improve Outcome: Progressing Goal: Ability to verbalize activity precautions or restrictions will improve Outcome: Progressing Goal: Understanding of discharge needs will improve Outcome: Progressing   Problem: Activity: Goal: Ability to perform//tolerate increased activity and mobilize with assistive devices will improve Outcome: Progressing   Problem: Clinical Measurements: Goal: Postoperative complications will be avoided or minimized Outcome: Progressing   Problem: Self-Care: Goal: Ability to meet self-care needs will improve Outcome: Progressing   Problem: Self-Concept: Goal: Ability to maintain and perform  role responsibilities to the fullest extent possible will improve Outcome: Progressing   Problem: Pain Management: Goal: Pain level will decrease with appropriate interventions Outcome: Progressing   Problem: Skin Integrity: Goal: Demonstration of wound healing without infection will improve Outcome: Progressing   Problem: Education: Goal: Knowledge of General Education information will improve Description: Including pain rating scale, medication(s)/side effects and non-pharmacologic comfort measures Outcome: Progressing   Problem: Health Behavior/Discharge Planning: Goal: Ability to manage health-related needs will improve Outcome: Progressing   Problem: Clinical Measurements: Goal: Ability to maintain clinical measurements within normal limits will improve Outcome: Progressing Goal: Will remain free from infection Outcome: Progressing Goal: Diagnostic test results will improve Outcome: Progressing Goal: Respiratory complications will improve Outcome: Progressing Goal: Cardiovascular complication will be avoided Outcome: Progressing   Problem: Activity: Goal: Risk for activity intolerance will decrease Outcome: Progressing   Problem: Nutrition: Goal: Adequate nutrition will be maintained Outcome: Progressing   Problem: Coping: Goal: Level of anxiety will decrease Outcome: Progressing   Problem: Elimination: Goal: Will not experience complications related to bowel motility Outcome: Progressing Goal: Will not experience complications related to urinary retention Outcome: Progressing   Problem: Pain Managment: Goal: General experience of comfort will improve Outcome: Progressing   Problem: Safety: Goal: Ability to remain free from injury will improve Outcome: Progressing   Problem: Skin Integrity: Goal: Risk for impaired skin integrity will decrease Outcome: Progressing   

## 2022-10-07 NOTE — H&P (Signed)
Kathryn Beck is an 67 y.o. female.   Chief Complaint: Pain and ulceration left foot HPI: Patient is a 67 year old woman who is 4 weeks status post left transmetatarsal amputation and debridement. Initial amputation with Dr. Lilian Beck May 25 and follow-up debridement with Dr. Ralene Beck on June 14.   Past Medical History:  Diagnosis Date   Acid reflux    Anemia    Arthritis    Axillary masses    Soft tissue - status post excision   Back pain    CHF (congestive heart failure) (HCC)    COVID-19 virus infection 04/06/2019   Depression    End-stage renal disease (HCC)    M/W/F dialysis   Essential hypertension    Headache    years ago   History of blood transfusion    History of cardiac catheterization    Normal coronary arteries October 2020   History of claustrophobia    History of pneumonia 2019   Hypoxia 04/03/2019   Memory loss    Mixed hyperlipidemia    Obesity    Pancreatitis    Peritoneal dialysis catheter in place [facility])    Pneumonia due to COVID-19 virus 04/02/2019   Sleep apnea    Noncompliant with CPAP   Stroke (HCC)    mini stroke   Type 2 diabetes mellitus (HCC)     Past Surgical History:  Procedure Laterality Date   ABDOMINAL AORTOGRAM W/LOWER EXTREMITY N/A 04/30/2022   Procedure: ABDOMINAL AORTOGRAM W/LOWER EXTREMITY;  Surgeon: Kathryn Douglas, MD;  [facility];  Service: Cardiovascular;  Laterality: N/A;   ABDOMINAL AORTOGRAM W/LOWER EXTREMITY N/A 07/21/2022   Procedure: ABDOMINAL AORTOGRAM W/LOWER EXTREMITY;  Surgeon: Kathryn Libman, MD;  Location: [facility];  Service: Cardiovascular;  Laterality: N/A;   ABDOMINAL HYSTERECTOMY     ACHILLES TENDON LENGTHENING  08/15/2022   Procedure: ACHILLES TENDON LENGTHENING;  Surgeon: Kathryn Beck, DPM;  Location: [facility] OR;  Service: Podiatry;;   AMPUTATION Right 05/29/2022   Procedure: RIGHT BELOW THE KNEE AMPUTATION;  Surgeon: Kathryn Mustard, MD;  Location: [facility] OR;  Service: Orthopedics;   Laterality: Right;   AMPUTATION Left 09/04/2022   Procedure: AMPUTATION FOOT, serial irrigation;  Surgeon: Kathryn Beck, DPM;  Location: [facility] OR;  Service: Podiatry;  Laterality: Left;  Surgical team to do block   AV FISTULA PLACEMENT Left 09/02/2017   Procedure: creation of left arm ARTERIOVENOUS (AV) FISTULA;  Surgeon: Kathryn Libman, MD;  Location: [facility] OR;  Service: Vascular;  Laterality: Left;   COLONOSCOPY  2008   Dr. Darrick Beck: normal    COLONOSCOPY N/A 12/18/2016   Dr. Darrick Beck: multiple tubular adenomas, internal hemorrhoids. Surveillance in 3 years    ESOPHAGEAL DILATION N/A 10/13/2015   Procedure: ESOPHAGEAL DILATION;  Surgeon: Kathryn Hippo, MD;  [facility];  Service: Endoscopy;  Laterality: N/A;   ESOPHAGOGASTRODUODENOSCOPY N/A 10/13/2015   Dr. Karilyn Beck: chronic gastritis on path, no H.pylori. Empiric dilation    ESOPHAGOGASTRODUODENOSCOPY N/A 12/18/2016   Dr. Darrick Beck: mild gastritis. BRAVO study revealed uncontrolled GERD. Dysphagia secondary to uncontrolled reflux   FOOT SURGERY Bilateral    "nerve"     LEFT HEART CATH AND CORONARY ANGIOGRAPHY N/A 12/29/2018   Procedure: LEFT HEART CATH AND CORONARY ANGIOGRAPHY;  Surgeon: Kathryn Crafts, MD;  Location: [facility] INVASIVE CV LAB;  Service: Cardiovascular;  Laterality: N/A;   LOWER EXTREMITY ANGIOGRAPHY Right 05/04/2022   Procedure: Lower Extremity Angiography;  Surgeon: Kathryn Sparrow, MD;  [facility];  Service: Cardiovascular;  Laterality: Right;   LUNG BIOPSY     MASS EXCISION Right 01/09/2013   Procedure: EXCISION OF NEOPLASM OF RIGHT  AXILLA  AND EXCISION OF NEOPLASM OF LEFT AXILLA;  Surgeon: Kathryn Heading, MD;  Location: [facility] ORS;  Service: General;  Laterality: Right;  procedure end @ 08:23   MYRINGOTOMY WITH TUBE PLACEMENT Bilateral 04/28/2017   Procedure: BILATERAL MYRINGOTOMY WITH TUBE PLACEMENT;  Surgeon: Kathryn Pies, MD;  Location: [facility] OR;  Service: ENT;  Laterality: Bilateral;   PERIPHERAL VASCULAR  BALLOON ANGIOPLASTY Right 05/04/2022   Procedure: PERIPHERAL VASCULAR BALLOON ANGIOPLASTY;  Surgeon: Kathryn Sparrow, MD;  Location: [facility] INVASIVE CV LAB;  Service: Cardiovascular;  Laterality: Right;  PT   PERIPHERAL VASCULAR INTERVENTION Right 05/04/2022   Procedure: PERIPHERAL VASCULAR INTERVENTION;  Surgeon: Kathryn Sparrow, MD;  Location: [facility] INVASIVE CV LAB;  Service: Cardiovascular;  Laterality: Right;  SFA   PERIPHERAL VASCULAR INTERVENTION Left 07/21/2022   Procedure: PERIPHERAL VASCULAR INTERVENTION;  Surgeon: Kathryn Libman, MD;  [facility];  Service: Cardiovascular;  Laterality: Left;   REVISION OF ARTERIOVENOUS GORETEX GRAFT Left 05/04/2018   Procedure: TRANSPOSITION OF CEPHALIC VEIN ARTERIOVENOUS FISTULA LEFT ARM;  Surgeon: Kathryn Earthly, MD;  Location: [facility] OR;  Service: Vascular;  Laterality: Left;   SAVORY DILATION N/A 12/18/2016   Procedure: SAVORY DILATION;  Surgeon: Kathryn Bali, MD;  [facility];  Service: Endoscopy;  Laterality: N/A;   TRANSMETATARSAL AMPUTATION Left 08/15/2022   Procedure: TRANSMETATARSAL AMPUTATION;  Surgeon: Kathryn Beck, DPM;  Location: [facility] OR;  Service: Podiatry;  Laterality: Left;    Family History  Problem Relation Age of Onset   Hypertension Father    Hypercholesterolemia Father    Arthritis Father    Hypertension Sister    Hypercholesterolemia Sister    Breast cancer Sister    Hypertension Sister    Colon cancer Neg Hx    Colon polyps Neg Hx    Social History:  reports that she has never smoked. She has never been exposed to tobacco smoke. She has never used smokeless tobacco. She reports that she does not drink alcohol and does not use drugs.  Allergies:  Allergies  Allergen Reactions   Ace Inhibitors Anaphylaxis and Swelling   Penicillins Itching, Swelling and Other (See Comments)    Did it involve swelling of the face/tongue/throat, SOB, or low BP? Unknown Did it involve sudden or severe rash/hives, skin  peeling, or any reaction on the inside of your mouth or nose? Unknown Did you need to seek medical attention at a hospital or doctor's office? Unknown When did it last happen?      years  If all above answers are "NO", may proceed with cephalosporin use.    Statins Other (See Comments)    elevated LFT's     Albuterol Swelling    No medications prior to admission.    No results found. However, due to the size of the patient record, not all encounters were searched. Please check Results Review for a complete set of results. No results found.  Review of Systems  All other systems reviewed and are negative.   There were no vitals taken for this visit. Physical Exam  Patient is alert, oriented, no adenopathy, well-dressed, normal affect, normal respiratory effort. Examination patient has multiphasic pulse dorsalis pedis by Doppler.  Examination of the forefoot patient has a necrotic wound over the transmetatarsal amputation that probes  to bone.  There is no ascending cellulitis no purulent drainage.  Hemoglobin is stable at 10.2. Assessment/Plan 1. Right below-knee amputee (HCC)   2. Dehiscence of amputation stump of left lower extremity (HCC)   3. History of transmetatarsal amputation of left foot (HCC)       Plan: With the progressive wound dehiscence I have recommended proceeding with a transtibial amputation risks and benefits were discussed patient states she understands and wished to proceed with surgery on Wednesday.  Plan for her to postpone her dialysis on Wednesday and will proceed with dialysis in the hospital on Thursday.  Kathryn Mustard, MD 10/07/2022, 6:55 AM

## 2022-10-07 NOTE — Op Note (Signed)
10/07/2022  2:42 PM  PATIENT:  [name]    PRE-OPERATIVE DIAGNOSIS:  Dehiscence Left Transmetatarsal Amputation  POST-OPERATIVE DIAGNOSIS:  Same  PROCEDURE:  LEFT BELOW KNEE AMPUTATION Application of Kerecis micro graft 38 cm  Application of Prevena customizable and Prevena arthroform wound VAC dressings Application of Vive Wear stump shrinker and the Hanger limb protector  SURGEON:  [name], MD  ANESTHESIA:   General  PREOPERATIVE INDICATIONS:  Kathryn Beck is a  67 y.o. female with a diagnosis of Dehiscence Left Transmetatarsal Amputation who failed conservative measures and elected for surgical management.    The risks benefits and alternatives were discussed with the patient preoperatively including but not limited to the risks of infection, bleeding, nerve injury, cardiopulmonary complications, the need for revision surgery, among others, and the patient was willing to proceed.  OPERATIVE IMPLANTS:   Implant Name Type Inv. Item Serial No. Manufacturer Lot No. LRB No. Used Action  GRAFT SKIN WND MICRO 38 - [id number] Tissue GRAFT SKIN WND MICRO 38  [employer] [id number] Left 1 Implanted     OPERATIVE FINDINGS: Patient had extremely calcified vessels.  OPERATIVE PROCEDURE: Patient was brought to the operating room after undergoing a regional anesthetic.  After adequate levels anesthesia were obtained a thigh tourniquet was placed and the lower extremity was prepped using DuraPrep draped into a sterile field. The foot was draped out of the sterile field with impervious stockinette.  A timeout was called and the tourniquet inflated.  A transverse skin incision was made 12 cm distal to the tibial tubercle, the incision curved proximally, and a large posterior flap was created.  The tibia was transected just proximal to the skin incision and beveled anteriorly.  The fibula was transected just proximal to the tibial incision.  The sciatic nerve was pulled cut and  allowed to retract.  The vascular bundles were suture ligated with 2-0 silk.  The tourniquet was deflated and hemostasis obtained.    The Kerecis micro powder 38 cm was applied to the open wound that has a 200 cm surface area. The deep and superficial fascial layers were closed using #1 Vicryl.  The skin was closed using staples.    The Prevena customizable dressing was applied this was overwrapped with the arthroform sponge.  Collier Flowers was used to secure the sponges and the circumferential compression was secured to the skin with Dermatac.  This was connected to the wound VAC pump and had a good suction fit this was covered with a stump shrinker and a limb protector.  Patient was taken to the PACU in stable condition.   DISCHARGE PLANNING:  Antibiotic duration: 24-hour antibiotics  Weightbearing: Nonweightbearing on the operative extremity  Pain medication: Opioid pathway  Dressing care/ Wound VAC: Continue wound VAC with the Prevena plus pump at discharge for 1 week  Ambulatory devices: Walker or kneeling scooter  Discharge to: Discharge planning based on recommendations per physical therapy  Follow-up: In the office 1 week after discharge.

## 2022-10-07 NOTE — Telephone Encounter (Signed)
Transition Care Management Unsuccessful Follow-up Telephone Call  Date of discharge and from where:  [facility] 7/[facility]  Attempts:  1st Attempt  Reason for unsuccessful TCM follow-up call:  No answer/busy.   Lenard Forth [facility] Guide, [facility] [phone] [address] [place], [place], [place] [address] Phone: [phone] Email: [email or web address]

## 2022-10-07 NOTE — Interval H&P Note (Signed)
History and Physical Interval Note:  10/07/2022 11:41 AM  Kathryn Beck  has presented today for surgery, with the diagnosis of Dehiscence Left Transmetatarsal Amputation.  The various methods of treatment have been discussed with the patient and family. After consideration of risks, benefits and other options for treatment, the patient has consented to  Procedure(s): LEFT BELOW KNEE AMPUTATION (Left) as a surgical intervention.  The patient's history has been reviewed, patient examined, no change in status, stable for surgery.  I have reviewed the patient's chart and labs.  Questions were answered to the patient's satisfaction.     [name]

## 2022-10-07 NOTE — Progress Notes (Signed)
Due to her ESRD, she will not need post op cefazolin. Pre-op dose will last 24 hrs.  Ulyses Southward, PharmD, BCIDP, AAHIVP, CPP Infectious Disease Pharmacist 10/07/2022 5:10 PM

## 2022-10-08 ENCOUNTER — Encounter (HOSPITAL_COMMUNITY): Payer: Self-pay | Admitting: Orthopedic Surgery

## 2022-10-08 ENCOUNTER — Inpatient Hospital Stay: Payer: Medicare PPO | Admitting: Internal Medicine

## 2022-10-08 LAB — GLUCOSE, CAPILLARY
Glucose-Capillary: 108 mg/dL — ABNORMAL HIGH (ref 70–99)
Glucose-Capillary: 215 mg/dL — ABNORMAL HIGH (ref 70–99)
Glucose-Capillary: 257 mg/dL — ABNORMAL HIGH (ref 70–99)
Glucose-Capillary: 95 mg/dL (ref 70–99)
Glucose-Capillary: 96 mg/dL (ref 70–99)

## 2022-10-08 LAB — HEPATITIS B CORE ANTIBODY, IGM: Hep B C IgM: NONREACTIVE

## 2022-10-08 LAB — HEPATITIS B SURFACE ANTIGEN: Hepatitis B Surface Ag: NONREACTIVE

## 2022-10-08 MED ORDER — HYDROCODONE-ACETAMINOPHEN 5-325 MG PO TABS
1.0000 | ORAL_TABLET | Freq: Four times a day (QID) | ORAL | Status: DC | PRN
  Administered 2022-10-08 – 2022-10-13 (×7): 1 via ORAL
  Filled 2022-10-08 (×8): qty 1

## 2022-10-08 NOTE — TOC Initial Note (Addendum)
Transition of Care [facility]) - Initial/Assessment Note    Patient Details  Name: Kathryn Beck MRN: [id number] Date of Birth: 1955-03-27  [facility]) CM/SW Contact:    Epifanio Lesches, RN Phone Number: 10/08/2022, 2:45 PM  Clinical Narrative:                     -s/p LEFT BELOW KNEE AMPUTATION , 7/17 RNCM received consult for possible SNF placement at time of discharge. RNCM spoke with patient , patient's  daughter [name] (consent received from pt) regarding SNF placement at time of discharge. Patient reported that patient's spouse is currently unable to care for patient at their home given patient's current physical needs and fall risk. Patient expressed understanding of SNF need and is agreeable to SNF placement at time of discharge. Patient reports preference for  [facility]. RNCM discussed insurance authorization process and provided Medicare SNF ratings list. Patient expressed being hopeful for rehab and to feel better soon. No further questions reported at this time. RNCM to continue to follow and assist with discharge planning needs.    Expected Discharge Plan: Skilled Nursing Facility Barriers to Discharge: Continued Medical Work up   Patient Goals and CMS Choice            Expected Discharge Plan and Services                                              Prior Living Arrangements/Services                       Activities of Daily Living Home Assistive Devices/Equipment: Wheelchair, CBG Meter, Bedside commode/3-in-1, Shower chair without back, Grab bars in shower, Grab bars around toilet (sliding board) ADL Screening (condition at time of admission) Patient's cognitive ability adequate to safely complete daily activities?: Yes Is the patient deaf or have difficulty hearing?: No Does the patient have difficulty seeing, even when wearing glasses/contacts?: No Does the patient have difficulty concentrating, remembering, or  making decisions?: No Patient able to express need for assistance with ADLs?: Yes Does the patient have difficulty dressing or bathing?: Yes Independently performs ADLs?: No Communication: Independent Dressing (OT): Independent Grooming: Independent Feeding: Independent Bathing: Independent Toileting: Independent with device (comment) In/Out Bed: Independent with device (comment) Walks in Home: Independent with device (comment) Does the patient have difficulty walking or climbing stairs?: Yes Weakness of Legs: None Weakness of Arms/Hands: None  Permission Sought/Granted                  Emotional Assessment              Admission diagnosis:  S/P BKA (below knee amputation) unilateral, left (HCC) [F09.323] Patient Active Problem List   Diagnosis Date Noted   Dehiscence of amputation stump of left lower extremity (HCC) 10/07/2022   S/P BKA (below knee amputation) unilateral, left (HCC) 10/07/2022   Subacute osteomyelitis, left ankle and foot (HCC) 09/03/2022   Left foot infection 09/03/2022   Status post transmetatarsal amputation of foot, left (HCC) 09/03/2022   Wound infection after surgery 09/03/2022   Equinus contracture of left ankle 08/15/2022   Acute osteomyelitis of toe of left foot (HCC) 08/13/2022   Peripheral vascular complication 07/21/2022   PVD (peripheral vascular disease) (HCC) 07/18/2022   Toe ulcer, left, limited to breakdown of skin (  HCC) 07/13/2022   S/P BKA (below knee amputation), right (HCC) 05/29/2022   Confusion 05/19/2022   Visual hallucinations 05/19/2022   Watery eyes 05/19/2022   Requires assistance with activities of daily living (ADL) 04/26/2022   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   Gangrene of right foot (HCC) 04/25/2022   PAD (peripheral artery disease) (HCC) 04/25/2022   Other osteoporosis without current pathological fracture 11/10/2021   Hypocalcemia 11/10/2021   Fall 11/02/2021   Left foot pain 10/31/2021    Low back pain with left-sided sciatica 10/31/2021   Left ear impacted cerumen 09/18/2021   Chronic left shoulder pain 06/10/2021   Abnormal CXR 03/10/2021   Ear pain, bilateral 03/10/2021   Morbid obesity (HCC) 03/10/2021   Subungual hematoma of second toe of left foot 10/28/2020   Insomnia 10/28/2020   Memory loss or impairment 09/26/2020   Forgetfulness 09/26/2020   Recurrent vertigo 09/26/2020   Abnormal MRI, cervical spine 03/04/2020   Right leg weakness 02/28/2020   Knee pain, right 10/23/2019   Cough 08/09/2019   Carpal tunnel syndrome 06/13/2019   Chronic pain syndrome 06/13/2019   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) 06/13/2019   Neck pain 03/23/2019   Near syncope 12/05/2018   ESRD on hemodialysis (HCC) 11/22/2018   Ischemic heart disease 09/25/2018   Not currently working due to disabled status 06/13/2018   HTN (hypertension) 06/12/2018   Depression 03/09/2018   Adrenal mass, left (HCC) 11/09/2017   MGUS (monoclonal gammopathy of unknown significance) 11/05/2017   Shoulder pain 11/01/2017   Chronic diastolic (congestive) heart failure (HCC) 09/20/2017   Bilateral leg weakness 09/09/2017   Adrenal adenoma 09/03/2017   Uncontrolled type 2 diabetes mellitus 08/18/2017   Unsteady gait 07/11/2017   Recurrent falls 07/11/2017   Anemia of chronic disease 03/24/2017   Personal history of colonic polyps 02/10/2017   Dysphagia 11/05/2016   Irritable bowel syndrome 02/04/2016   Hospital discharge follow-up 10/27/2015   Intolerant of heat 10/07/2014   Cardiac murmur 01/17/2014   Back pain with left-sided radiculopathy 09/18/2013   Seasonal allergies 03/10/2013   Lipoma of back 12/22/2012   Other seasonal allergic rhinitis 08/22/2012   OSA (obstructive sleep apnea) 06/02/2011   Chronic kidney disease, stage 4 (severe) (HCC) 01/13/2011   Neuropathy 04/16/2010   Malaise and fatigue 07/24/2009   Personal history of other diseases of the nervous system  and sense organs 05/08/2009   LUPUS ERYTHEMATOSUS, DISCOID 06/05/2008   Arm pain 05/30/2008   Hyperlipidemia 06/07/2007   Obesity 06/07/2007   Malignant hypertension 06/07/2007   Type 2 diabetes mellitus with hyperglycemia (HCC) 06/07/2007   Diabetes mellitus (HCC) 06/07/2007   PCP:  [name], MD Pharmacy:   [facility] - [place], [place] - [address] [place] [address] Phone: [phone] Fax: [phone]     Social Determinants of Health (SDOH) Social History: SDOH Screenings   Food Insecurity: No Food Insecurity (10/07/2022)  Housing: Low Risk  (10/07/2022)  Transportation Needs: No Transportation Needs (10/07/2022)  Utilities: Not At Risk (10/07/2022)  Alcohol Screen: Low Risk  (05/12/2022)  Depression (PHQ2-9): Low Risk  (07/07/2022)  Recent Concern: Depression (PHQ2-9) - High Risk (05/12/2022)  Financial Resource Strain: Medium Risk (05/12/2022)  Physical Activity: Inactive (05/12/2022)  Social Connections: Socially Integrated (05/12/2022)  Stress: No Stress Concern Present (05/12/2022)  Tobacco Use: Low Risk  (10/07/2022)   SDOH Interventions:     Readmission Risk Interventions    05/01/2022  3:54 PM  Readmission Risk Prevention Plan  Transportation Screening Complete  HRI or Home Care Consult Complete  Social Work Consult for Recovery Care Planning/Counseling --  Palliative Care Screening Not Applicable  Medication Review Oceanographer) Complete

## 2022-10-08 NOTE — [place] FL2 (Signed)
[place] MEDICAID FL2 LEVEL OF CARE FORM     IDENTIFICATION  Patient Name: Kathryn Beck Birthdate: 1955-10-29 Sex: female Admission Date (Current Location): 10/07/2022  [facility] and [place] Number:  Producer, television/film/video and Address:  [facility] [place], [address], [place], [place] [address]      Provider Number: [id number]  Attending Physician Name and Address:  [name], MD  Relative Name and Phone Number:  [name] Daughter [phone]    Current Level of Care: Hospital Recommended Level of Care: Skilled Nursing Facility Prior Approval Number:    Date Approved/Denied:   PASRR Number: [id number]  Discharge Plan: SNF    Current Diagnoses: Patient Active Problem List   Diagnosis Date Noted   Dehiscence of amputation stump of left lower extremity (HCC) 10/07/2022   S/P BKA (below knee amputation) unilateral, left (HCC) 10/07/2022   Subacute osteomyelitis, left ankle and foot (HCC) 09/03/2022   Left foot infection 09/03/2022   Status post transmetatarsal amputation of foot, left (HCC) 09/03/2022   Wound infection after surgery 09/03/2022   Equinus contracture of left ankle 08/15/2022   Acute osteomyelitis of toe of left foot (HCC) 08/13/2022   Peripheral vascular complication 07/21/2022   PVD (peripheral vascular disease) (HCC) 07/18/2022   Toe ulcer, left, limited to breakdown of skin (HCC) 07/13/2022   S/P BKA (below knee amputation), right (HCC) 05/29/2022   Confusion 05/19/2022   Visual hallucinations 05/19/2022   Watery eyes 05/19/2022   Requires assistance with activities of daily living (ADL) 04/26/2022   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   Gangrene of right foot (HCC) 04/25/2022   PAD (peripheral artery disease) (HCC) 04/25/2022   Other osteoporosis without current pathological fracture 11/10/2021   Hypocalcemia 11/10/2021   Fall 11/02/2021   Left foot pain 10/31/2021   Low back pain with left-sided  sciatica 10/31/2021   Left ear impacted cerumen 09/18/2021   Chronic left shoulder pain 06/10/2021   Abnormal CXR 03/10/2021   Ear pain, bilateral 03/10/2021   Morbid obesity (HCC) 03/10/2021   Subungual hematoma of second toe of left foot 10/28/2020   Insomnia 10/28/2020   Memory loss or impairment 09/26/2020   Forgetfulness 09/26/2020   Recurrent vertigo 09/26/2020   Abnormal MRI, cervical spine 03/04/2020   Right leg weakness 02/28/2020   Knee pain, right 10/23/2019   Cough 08/09/2019   Carpal tunnel syndrome 06/13/2019   Chronic pain syndrome 06/13/2019   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) 06/13/2019   Neck pain 03/23/2019   Near syncope 12/05/2018   ESRD on hemodialysis (HCC) 11/22/2018   Ischemic heart disease 09/25/2018   Not currently working due to disabled status 06/13/2018   HTN (hypertension) 06/12/2018   Depression 03/09/2018   Adrenal mass, left (HCC) 11/09/2017   MGUS (monoclonal gammopathy of unknown significance) 11/05/2017   Shoulder pain 11/01/2017   Chronic diastolic (congestive) heart failure (HCC) 09/20/2017   Bilateral leg weakness 09/09/2017   Adrenal adenoma 09/03/2017   Uncontrolled type 2 diabetes mellitus 08/18/2017   Unsteady gait 07/11/2017   Recurrent falls 07/11/2017   Anemia of chronic disease 03/24/2017   Personal history of colonic polyps 02/10/2017   Dysphagia 11/05/2016   Irritable bowel syndrome 02/04/2016   Hospital discharge follow-up 10/27/2015   Intolerant of heat 10/07/2014   Cardiac murmur 01/17/2014   Back pain with left-sided radiculopathy 09/18/2013   Seasonal allergies 03/10/2013   Lipoma of back 12/22/2012   Other seasonal  allergic rhinitis 08/22/2012   OSA (obstructive sleep apnea) 06/02/2011   Chronic kidney disease, stage 4 (severe) (HCC) 01/13/2011   Neuropathy 04/16/2010   Malaise and fatigue 07/24/2009   Personal history of other diseases of the nervous system and sense organs 05/08/2009    LUPUS ERYTHEMATOSUS, DISCOID 06/05/2008   Arm pain 05/30/2008   Hyperlipidemia 06/07/2007   Obesity 06/07/2007   Malignant hypertension 06/07/2007   Type 2 diabetes mellitus with hyperglycemia (HCC) 06/07/2007   Diabetes mellitus (HCC) 06/07/2007    Orientation RESPIRATION BLADDER Height & Weight     Self, Time, Situation, Place  Normal Continent Weight: 79.8 kg Height:  5\' 4"  (162.6 cm)  BEHAVIORAL SYMPTOMS/MOOD NEUROLOGICAL BOWEL NUTRITION STATUS      Continent Diet (refer to d/c summary)  AMBULATORY STATUS COMMUNICATION OF NEEDS Skin   Extensive Assist Verbally Normal (LEFT BELOW KNEE AMPUTATION, 7/17)                       Personal Care Assistance Level of Assistance  Bathing, Feeding, Dressing Bathing Assistance: Limited assistance Feeding assistance: Independent Dressing Assistance: Limited assistance     Functional Limitations Info  Hearing, Speech, Sight Sight Info: Adequate Hearing Info: Adequate Speech Info: Adequate    SPECIAL CARE FACTORS FREQUENCY  PT (By licensed PT), OT (By licensed OT)     PT Frequency: 5x/week, evaluate and treat OT Frequency: 5x/week, evaluate and treat            Contractures Contractures Info: Not present    Additional Factors Info  Code Status, Allergies Code Status Info: Full code Allergies Info: Ace Inhibitors, Penicillins, Statins, Albuterol           Current Medications (10/08/2022):  This is the current hospital active medication list Current Facility-Administered Medications  Medication Dose Route Frequency Provider Last Rate Last Admin   0.9 %  sodium chloride infusion   Intravenous Continuous [name], MD   Stopped at 10/08/22 0700   acetaminophen (TYLENOL) tablet 325-650 mg  325-650 mg Oral Q6H PRN [name], MD       alum & mag hydroxide-simeth (MAALOX/MYLANTA) 200-200-20 MG/5ML suspension 15-30 mL  15-30 mL Oral Q2H PRN [name], MD       ascorbic acid (VITAMIN C) tablet 1,000 mg   1,000 mg Oral Daily [name], MD   1,000 mg at 10/08/22 1610   aspirin chewable tablet 162 mg  162 mg Oral Daily [name], MD   162 mg at 10/08/22 0947   bisacodyl (DULCOLAX) EC tablet 5 mg  5 mg Oral Daily PRN [name], MD       calcitRIOL (ROCALTROL) capsule 0.25 mcg  0.25 mcg Oral BID WC [name], MD   0.25 mcg at 10/08/22 0942   [START ON 10/09/2022] calcitRIOL (ROCALTROL) capsule 3 mcg  3 mcg Oral Q M,W,F-HD Anthony Sar, MD       [START ON 10/09/2022] cinacalcet (SENSIPAR) tablet 90 mg  90 mg Oral Q M,W,F-HD Anthony Sar, MD       docusate sodium (COLACE) capsule 100 mg  100 mg Oral Daily [name], MD   100 mg at 10/08/22 0946   DULoxetine (CYMBALTA) DR capsule 60 mg  60 mg Oral Daily [name], MD   60 mg at 10/08/22 0948   ezetimibe (ZETIA) tablet 10 mg  10 mg Oral Daily [name], MD   10 mg at 10/08/22 [id number]  guaiFENesin-dextromethorphan (ROBITUSSIN DM) 100-10 MG/5ML syrup 15 mL  15 mL Oral Q4H PRN [name], MD       hydrALAZINE (APRESOLINE) injection 5 mg  5 mg Intravenous Q20 Min PRN [name], MD   5 mg at 10/07/22 1717   HYDROcodone-acetaminophen (NORCO/VICODIN) 5-325 MG per tablet 1 tablet  1 tablet Oral Q6H PRN [name], MD       insulin aspart (novoLOG) injection 0-9 Units  0-9 Units Subcutaneous TID WC [name], MD   5 Units at 10/08/22 1305   insulin aspart (novoLOG) injection 3 Units  3 Units Subcutaneous TID WC [name], MD   3 Units at 10/08/22 1304   insulin glargine-yfgn (SEMGLEE) injection 20 Units  20 Units Subcutaneous QHS [name], MD   20 Units at 10/08/22 0038   ketotifen (ZADITOR) 0.035 % ophthalmic solution 1 drop  1 drop Both Eyes BID [name], MD   1 drop at 10/08/22 0949   labetalol (NORMODYNE) injection 10 mg  10 mg Intravenous Q10 min PRN [name], MD       magnesium citrate solution 1 Bottle  1 Bottle Oral Once PRN [name], MD       magnesium sulfate IVPB 2 g 50 mL  2 g  Intravenous Daily PRN [name], MD       metoprolol succinate (TOPROL-XL) 24 hr tablet 50 mg  50 mg Oral Once per day on Sunday Tuesday Thursday Saturday [name], MD   50 mg at 10/08/22 0943   metoprolol tartrate (LOPRESSOR) injection 2-5 mg  2-5 mg Intravenous Q2H PRN [name], MD       nutrition supplement (JUVEN) (JUVEN) powder packet 1 packet  1 packet Oral BID BM [name], MD   1 packet at 10/08/22 0950   ondansetron (ZOFRAN) injection 4 mg  4 mg Intravenous Q6H PRN [name], MD       pantoprazole (PROTONIX) EC tablet 40 mg  40 mg Oral Daily [name], MD   40 mg at 10/08/22 0937   phenol (CHLORASEPTIC) mouth spray 1 spray  1 spray Mouth/Throat PRN [name], MD       polyethylene glycol (MIRALAX / GLYCOLAX) packet 17 g  17 g Oral Daily PRN [name], MD       potassium chloride SA (KLOR-CON M) CR tablet 20-40 mEq  20-40 mEq Oral Daily PRN [name], MD       pregabalin (LYRICA) capsule 50 mg  50 mg Oral TID [name], MD   50 mg at 10/08/22 0942   sertraline (ZOLOFT) tablet 50 mg  50 mg Oral Daily [name], MD   50 mg at 10/08/22 1610   sucroferric oxyhydroxide ([name]) chewable tablet 1,000 mg  1,000 mg Oral TID WC [name], MD   1,000 mg at 10/08/22 1304   sucroferric oxyhydroxide ([name]) chewable tablet 500 mg  500 mg Oral TID PRN Arlester Marker, Minh Q, RPH-CPP       zinc sulfate capsule 220 mg  220 mg Oral Daily [name], MD   220 mg at 10/08/22 9604   Facility-Administered Medications Ordered in Other Encounters  Medication Dose Route Frequency Provider Last Rate Last Admin   0.9 %  sodium chloride infusion   Intravenous Continuous Lockamy, Randi L, NP-C   Stopped at 10/07/22 1422     Discharge Medications:  Please see discharge summary for a list of discharge medications.  Relevant Imaging Results:  Relevant Lab Results:   Additional Information SSN [id number],NF receives out-pt HD at [facility] on  MWF. Pt arrives at 6:15  am for 6:30 am chair time.  Epifanio Lesches, RN

## 2022-10-08 NOTE — Progress Notes (Signed)
   10/07/22 2230  Vitals  BP Location Right Arm  BP Method Automatic  Patient Position (if appropriate) Lying  Pulse Rate 77  Pulse Rate Source Monitor  ECG Heart Rate 78  Resp 20  Oxygen Therapy  SpO2 98 %  O2 Device Nasal Cannula  O2 Flow Rate (L/min) 4 L/min  Patient Activity (if Appropriate) In bed  Pulse Oximetry Type Continuous  During Treatment Monitoring  Blood Flow Rate (mL/min) 400 mL/min  Arterial Pressure (mmHg) 200 mmHg  Venous Pressure (mmHg) 240 mmHg  TMP (mmHg) 12 mmHg  Ultrafiltration Rate (mL/min) 1255 mL/min  Dialysate Flow Rate (mL/min) 300 ml/min  HD Safety Checks Performed Yes  Intra-Hemodialysis Comments Tx completed  Post Treatment  Dialyzer Clearance Lightly streaked  Duration of HD Treatment -hour(s) 3 hour(s)  Hemodialysis Intake (mL) 0 mL  Liters Processed 72  Fluid Removed (mL) 2400 mL  Tolerated HD Treatment Yes  Post-Hemodialysis Comments pt stable, but sleepy  AVG/AVF Arterial Site Held (minutes) 10 minutes  AVG/AVF Venous Site Held (minutes) 10 minutes   Report to Carlean Purl

## 2022-10-08 NOTE — TOC Progression Note (Signed)
Transition of Care [facility]) - Progression Note    Patient Details  Name: Kathryn Beck MRN: [id number] Date of Birth: 12-Jul-1955  Transition of Care [facility]) CM/SW Contact  Lorri Frederick, LCSW Phone Number: 10/08/2022, 3:40 PM  Clinical Narrative:  CSW messaged with Dest[facility].  She will review referral and check insurance benefit status.      Expected Discharge Plan: Skilled Nursing Facility Barriers to Discharge: Continued Medical Work up  Expected Discharge Plan and Services                                               Social Determinants of Health (SDOH) Interventions SDOH Screenings   Food Insecurity: No Food Insecurity (10/07/2022)  Housing: Low Risk  (10/07/2022)  Transportation Needs: No Transportation Needs (10/07/2022)  Utilities: Not At Risk (10/07/2022)  Alcohol Screen: Low Risk  (05/12/2022)  Depression (PHQ2-9): Low Risk  (07/07/2022)  Recent Concern: Depression (PHQ2-9) - High Risk (05/12/2022)  Financial Resource Strain: Medium Risk (05/12/2022)  Physical Activity: Inactive (05/12/2022)  Social Connections: Socially Integrated (05/12/2022)  Stress: No Stress Concern Present (05/12/2022)  Tobacco Use: Low Risk  (10/07/2022)    Readmission Risk Interventions    05/01/2022    3:54 PM  Readmission Risk Prevention Plan  Transportation Screening Complete  HRI or Home Care Consult Complete  Social Work Consult for Recovery Care Planning/Counseling --  Palliative Care Screening Not Applicable  Medication Review Oceanographer) Complete

## 2022-10-08 NOTE — Evaluation (Signed)
Occupational Therapy Evaluation Patient Details Name: Kathryn Beck MRN: [id number] DOB: 1955-08-19 Today's Date: 10/08/2022   History of Present Illness The pt is a 67 yo female presenting 7/17 for L BKA due to non-healing wound. PMH includes: R BKA, anemia, arthritis, CHF, ESRD on HD MWF, HTN, memory loss, obesity, OSA, stroke, and DM II.   Clinical Impression   Pt admitted for above, she is presenting with impaired cognition which is suspected due to the medications. PT having a hard time following commands and exhibits a blank gaze into space but is somewhat redirecatable after consistent calling of name. Pt also aware something is off, reports "it's like a puzzle" but not able to fully articulate. Pt needing Max A +2 for bed mobility and not able to full engage in bADLs at this time. OT to follow-up with pt tomorrow if able to fully reassess patient's functional capacities. Pt would benefit from continued acute skilled OT services to address deficits and help transition to next level of care. Recommend AIR at this time as pt was mod I level before and is working to progress with prosthesis.      Recommendations for follow up therapy are one component of a multi-disciplinary discharge planning process, led by the attending physician.  Recommendations may be updated based on patient status, additional functional criteria and insurance authorization.   Assistance Recommended at Discharge Frequent or constant Supervision/Assistance  Patient can return home with the following A lot of help with walking and/or transfers;A lot of help with bathing/dressing/bathroom;Assistance with cooking/housework;Assist for transportation;Help with stairs or ramp for entrance    Functional Status Assessment  Patient has had a recent decline in their functional status and demonstrates the ability to make significant improvements in function in a reasonable and predictable amount of time.  Equipment  Recommendations  None recommended by OT (defer to next level of care)    Recommendations for Other Services       Precautions / Restrictions Precautions Precautions: Fall Precaution Comments: bilateral BKA Restrictions Weight Bearing Restrictions: Yes LLE Weight Bearing: Non weight bearing Other Position/Activity Restrictions: limb protector      Mobility Bed Mobility Overal bed mobility: Needs Assistance Bed Mobility: Supine to Sit, Sit to Supine     Supine to sit: Max assist, +2 for physical assistance, HOB elevated Sit to supine: Max assist, +2 for physical assistance, HOB elevated   General bed mobility comments: pt with some attempt to follow max cues for transition to sitting EOB, but needing significant assist of 2 to complete and maintain sitting balance    Transfers                   General transfer comment: deferred due to inconsitent command following and lethargy in sitting      Balance Overall balance assessment: Needs assistance Sitting-balance support: Feet supported Sitting balance-Leahy Scale: Poor Sitting balance - Comments: dependent on at least minA, at times modA to maintain static sitting Postural control: Posterior lean                                 ADL either performed or assessed with clinical judgement   ADL Overall ADL's : Needs assistance/impaired Eating/Feeding: Bed level;Set up   Grooming: Bed level;Maximal assistance   Upper Body Bathing: Bed level;Maximal assistance   Lower Body Bathing: Bed level;Maximal assistance   Upper Body Dressing : Bed level;Maximal assistance   Lower  Body Dressing: Maximal assistance;Bed level     Toilet Transfer Details (indicate cue type and reason): nt   Toileting - Clothing Manipulation Details (indicate cue type and reason): nt     Functional mobility during ADLs: +2 for physical assistance;+2 for safety/equipment;Maximal assistance General ADL Comments: Pt very out  of it cognitively, suspect due to medications. Pt not appropriately following commands or fully sequencing. +2 to get to EOB     Vision   Additional Comments: Hard to fully assess due to cognition from meds, Pt inconsistent in command following. Presenting with strong blank gaze     Perception     Praxis      Pertinent Vitals/Pain Pain Assessment Pain Assessment: Faces Faces Pain Scale: Hurts a little bit Pain Location: incision Pain Descriptors / Indicators: Grimacing Pain Intervention(s): Monitored during session, Repositioned     Hand Dominance Right   Extremity/Trunk Assessment Upper Extremity Assessment Upper Extremity Assessment: Generalized weakness   Lower Extremity Assessment Lower Extremity Assessment: Generalized weakness RLE Deficits / Details: chronic BKA, limited assessment due to cognition. pt following intermittent commands only LLE Deficits / Details: limited assessment due to poor command following   Cervical / Trunk Assessment Cervical / Trunk Assessment: Other exceptions Cervical / Trunk Exceptions: large body habitus   Communication Communication Communication: HOH   Cognition Arousal/Alertness: Lethargic, Suspect due to medications Behavior During Therapy: Flat affect Overall Cognitive Status: Impaired/Different from baseline Area of Impairment: Attention, Memory, Following commands, Safety/judgement, Awareness, Problem solving                   Current Attention Level: Focused Memory: Decreased short-term memory, Decreased recall of precautions Following Commands: Follows one step commands inconsistently, Follows one step commands with increased time Safety/Judgement: Decreased awareness of safety, Decreased awareness of deficits Awareness: Intellectual Problem Solving: Slow processing, Decreased initiation, Difficulty sequencing, Requires verbal cues General Comments: pt lethargic due to IV dilaudid. staring off for most of session but  would attend with max cues. following one-step commands intermittently and with max cues.     General Comments  SpO2 88% on RA upon arrival. increased to 98% on 2L    Exercises     Shoulder Instructions      Home Living Family/patient expects to be discharged to:: Private residence Living Arrangements: Spouse/significant other Available Help at Discharge: Family;Available PRN/intermittently Type of Home: House Home Access: Stairs to enter [employer] of Steps: 2 Entrance Stairs-Rails: None Home Layout: Able to live on main level with bedroom/bathroom     Bathroom Shower/Tub: Tub/shower unit;Sponge bathes at baseline   Bathroom Toilet: Standard Bathroom Accessibility: Yes   Home Equipment: BSC/3in1;Wheelchair - Forensic psychologist (2 wheels);Cane - single point;Grab bars - tub/shower          Prior Functioning/Environment Prior Level of Function : Needs assist             Mobility Comments: pt transferring independently with slideboard to and from [facility] and in bathroom. ADLs Comments: family assisted with set up for bathing, pt able to sponge bathe independently and use bathroom        OT Problem List: Impaired balance (sitting and/or standing)      OT Treatment/Interventions: Self-care/ADL training;Patient/family education;Therapeutic activities;Therapeutic exercise;Balance training    OT Goals(Current goals can be found in the care plan section) Acute Rehab OT Goals OT Goal Formulation: Patient unable to participate in goal setting Time For Goal Achievement: 10/29/22 Potential to Achieve Goals: Good ADL Goals Pt Will Perform Grooming:  with set-up;with supervision;sitting Pt Will Perform Lower Body Bathing: with set-up;bed level;with modified independence Pt Will Perform Lower Body Dressing: bed level;with modified independence Pt Will Transfer to Toilet: anterior/posterior transfer;with min guard assist  OT Frequency: Min 1X/week     Co-evaluation PT/OT/SLP Co-Evaluation/Treatment: Yes Reason for Co-Treatment: Necessary to address cognition/behavior during functional activity;For patient/therapist safety;To address functional/ADL transfers PT goals addressed during session: Mobility/safety with mobility;Balance;Strengthening/ROM OT goals addressed during session: ADL's and self-care      AM-PAC OT "6 Clicks" Daily Activity     Outcome Measure Help from another person eating meals?: A Little Help from another person taking care of personal grooming?: A Lot Help from another person toileting, which includes using toliet, bedpan, or urinal?: A Lot Help from another person bathing (including washing, rinsing, drying)?: A Lot Help from another person to put on and taking off regular upper body clothing?: A Lot Help from another person to put on and taking off regular lower body clothing?: A Lot 6 Click Score: 13   End of Session Nurse Communication: Need for lift equipment  Activity Tolerance: Treatment limited secondary to medical complications (Comment) Patient left: in bed;with call bell/phone within reach;with bed alarm set;with family/visitor present  OT Visit Diagnosis: Unsteadiness on feet (R26.81);Other abnormalities of gait and mobility (R26.89)                Time: 1062-6948 OT Time Calculation (min): 37 min Charges:  OT General Charges $OT Visit: 1 Visit OT Evaluation $OT Eval Moderate Complexity: 1 Mod  10/08/2022  AB, OTR/L  Acute Rehabilitation Services  Office: [phone]   Tristan Schroeder 10/08/2022, 1:55 PM

## 2022-10-08 NOTE — Progress Notes (Addendum)
Patient requested pain med at 0935 -stating pain score is 9. Requested Dilaudid. Pain med given. She slept for over an hour and the had difficulty participating in her care, such as taking pills and doing PT. Also, oxygen saturation was 88% on room air.  Oxygen 2 liters applied. She is waking up more now.  Addendum at 1825: Patient is alert and awake. Pain score is 6 -7.  Still has poor appetite, but will will accept Nepro (drinks about half of it).

## 2022-10-08 NOTE — Evaluation (Signed)
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 PMH includes: R BKA, anemia, arthritis, CHF, ESRD on HD MWF, HTN, memory loss, obesity, OSA, stroke, and DM II.   Clinical Impression  Pt in bed upon arrival of PT, agreeable to evaluation at this time. Prior to admission the pt was mobilizing in a [facility] but able to complete transfers with use of slideboard and receiving some assist and set up from family with ADLs. The pt presents with significant lethargy this session (suspect due to IV dilaudid given earlier), and was unable to consistently follow commands or answer questions. Therefore, safe mobility was limited to sitting EOB, wich required maxA of 2 to complete transition and min-modA of 1 to maintain static sitting EOB. The pt has good family support, but will benefit from continued skilled PT acutely and intensive therapies after d/c to facilitate return to independence with transfers and progress with RLE prosthetic training.      Assistance Recommended at Discharge Frequent or constant Supervision/Assistance  If plan is discharge home, recommend the following:  Can travel by private vehicle  Two people to help with walking and/or transfers;Two people to help with bathing/dressing/bathroom;Direct supervision/assist for medications management;Direct supervision/assist for financial management;Assist for transportation;Help with stairs or ramp for entrance   No    Equipment Recommendations Other (comment) (defer to post acute)  Recommendations for Other Services       Functional Status Assessment Patient has had a recent decline in their functional status and demonstrates the ability to make significant improvements in function in a reasonable and predictable amount of time.     Precautions / Restrictions  Precautions Precautions: Fall Precaution Comments: bilateral BKA Restrictions Weight Bearing Restrictions: Yes LLE Weight Bearing: Non weight bearing Other Position/Activity Restrictions: limb protector      Mobility  Bed Mobility Overal bed mobility: Needs Assistance Bed Mobility: Supine to Sit, Sit to Supine     Supine to sit: Max assist, +2 for physical assistance, HOB elevated Sit to supine: Max assist, +2 for physical assistance, HOB elevated   General bed mobility comments: pt with some attempt to follow max cues for transition to sitting EOB, but needing significant assist of 2 to complete and maintain sitting balance    Transfers                   General transfer comment: deferred due to inconsitent command following and lethargy in sitting       Balance Overall balance assessment: Needs assistance Sitting-balance support: Feet supported Sitting balance-Leahy Scale: Poor Sitting balance - Comments: dependent on at least minA, at times modA to maintain static sitting Postural control: Posterior lean                                   Pertinent Vitals/Pain Pain Assessment Pain Assessment: Faces Faces Pain Scale: Hurts a little bit Pain Location: incision Pain Descriptors / Indicators: Grimacing Pain Intervention(s): Limited activity within patient's tolerance, Monitored during session, Repositioned    Home Living Family/patient expects to be discharged to:: Private residence Living Arrangements: Spouse/significant other Available Help at Discharge: Family;Available PRN/intermittently Type of Home: House Home Access: Stairs to enter Entrance Stairs-Rails: None Entrance Stairs-Number of Steps: 2   Home Layout: Able to  live on main level with bedroom/bathroom Home Equipment: BSC/3in1;Wheelchair - Forensic psychologist (2 wheels);Cane - single point;Grab bars - tub/shower      Prior Function Prior Level of Function : Needs assist              Mobility Comments: pt transferring independently with slideboard to and from [facility] and in bathroom. ADLs Comments: family assisted with set up for bathing, pt able to sponge bathe independently and use bathroom     Hand Dominance   Dominant Hand: Right    Extremity/Trunk Assessment   Upper Extremity Assessment Upper Extremity Assessment: Defer to OT evaluation    Lower Extremity Assessment Lower Extremity Assessment: Generalized weakness;RLE deficits/detail;LLE deficits/detail RLE Deficits / Details: chronic BKA, limited assessment due to cognition. pt following intermittent commands only LLE Deficits / Details: limited assessment due to poor command following    Cervical / Trunk Assessment Cervical / Trunk Assessment: Other exceptions Cervical / Trunk Exceptions: large body habitus  Communication   Communication: HOH  Cognition Arousal/Alertness: Lethargic, Suspect due to medications Behavior During Therapy: Flat affect Overall Cognitive Status: Impaired/Different from baseline Area of Impairment: Attention, Memory, Following commands, Safety/judgement, Awareness, Problem solving                   Current Attention Level: Focused Memory: Decreased short-term memory, Decreased recall of precautions Following Commands: Follows one step commands inconsistently, Follows one step commands with increased time Safety/Judgement: Decreased awareness of safety, Decreased awareness of deficits Awareness: Intellectual Problem Solving: Slow processing, Decreased initiation, Difficulty sequencing, Requires verbal cues General Comments: pt lethargic due to IV dilaudid. staring off for most of session but would attend with max cues. following one-step commands intermittently and with max cues.        General Comments General comments (skin integrity, edema, etc.): SpO2 88% on RA upon arrival. increased to 98% on 2L    Exercises     Assessment/Plan    PT Assessment  Patient needs continued PT services  PT Problem List Decreased strength;Decreased activity tolerance;Decreased balance;Decreased mobility;Decreased safety awareness       PT Treatment Interventions DME instruction;Gait training;Stair training;Functional mobility training;Therapeutic activities;Therapeutic exercise;Balance training;Patient/family education    PT Goals (Current goals can be found in the Care Plan section)  Acute Rehab PT Goals Patient Stated Goal: return home PT Goal Formulation: With patient/family Time For Goal Achievement: 10/22/22 Potential to Achieve Goals: Fair    Frequency Min 3X/week     Co-evaluation PT/OT/SLP Co-Evaluation/Treatment: Yes Reason for Co-Treatment: Necessary to address cognition/behavior during functional activity;For patient/therapist safety;To address functional/ADL transfers PT goals addressed during session: Mobility/safety with mobility;Balance;Strengthening/ROM         AM-PAC PT "6 Clicks" Mobility  Outcome Measure Help needed turning from your back to your side while in a flat bed without using bedrails?: Total Help needed moving from lying on your back to sitting on the side of a flat bed without using bedrails?: Total Help needed moving to and from a bed to a chair (including a wheelchair)?: Total Help needed standing up from a chair using your arms (e.g., wheelchair or bedside chair)?: Total Help needed to walk in hospital room?: Total Help needed climbing 3-5 steps with a railing? : Total 6 Click Score: 6    End of Session Equipment Utilized During Treatment: Oxygen Activity Tolerance: Patient limited by lethargy Patient left: in bed;with call bell/phone within reach;with bed alarm set;with family/visitor present Nurse Communication: Mobility status PT Visit Diagnosis: Other abnormalities of gait  and mobility (R26.89);Muscle weakness (generalized) (M62.81)    Time: 4742-5956 PT Time Calculation (min) (ACUTE ONLY): 39  min   Charges:   PT Evaluation $PT Eval Low Complexity: 1 Low PT Treatments $Therapeutic Activity: 8-22 mins PT General Charges $$ ACUTE PT VISIT: 1 Visit         Vickki Muff, PT, DPT   Acute Rehabilitation Department Office [phone] Secure Chat Communication Preferred  Ronnie Derby 10/08/2022, 1:26 PM

## 2022-10-08 NOTE — Progress Notes (Signed)
IP rehab admissions - I met with patient and her family at the bedside.  I spoke with patient's daughter, [name], by phone.  Daughter preference is for patient to go to [facility] in [place] [facility] for her rehab.  Patient has been there previously.  Call for questions.  [phone]

## 2022-10-08 NOTE — Progress Notes (Signed)
Pt receives out-pt HD at [facility] on MWF. Pt arrives at 6:15 am for 6:30 am chair time. Will assist as needed.   Olivia Canter Renal Navigator [phone]

## 2022-10-08 NOTE — Inpatient Diabetes Management (Addendum)
Inpatient Diabetes Program Recommendations  AACE/ADA: New Consensus Statement on Inpatient Glycemic Control (2015)  Target Ranges:  Prepandial:   less than 140 mg/dL      Peak postprandial:   less than 180 mg/dL (1-2 hours)      Critically ill patients:  140 - 180 mg/dL   Lab Results  Component Value Date   GLUCAP 257 (H) 10/08/2022   HGBA1C 6.3 (H) 07/07/2022    Review of Glycemic Control  Latest Reference Range & Units 10/07/22 11:21 10/07/22 13:24 10/07/22 14:36 10/07/22 17:49 10/08/22 00:26 10/08/22 07:53 10/08/22 11:22  Glucose-Capillary 70 - 99 mg/dL 604 (H) 540 (H) 981 (H) 243 (H) 108 (H) 96 257 (H)   Diabetes history: DM 2 Outpatient Diabetes medications: Toujeo 30 units Daily, Fiasp 10-20 units tid SSI Current orders for Inpatient glycemic control:  Novolog 0-9 units tid  Novolog 3 units tid Semglee 20 units qhs  Note pt received decadron 5 mg yesterday  Inpatient Diabetes Program Recommendations:    -  Consider increasing Novolog meal coverage to 5 units tid meal coverage if eating >50% of meals  Thanks,  Christena Deem RN, MSN, BC-ADM Inpatient Diabetes Coordinator Team Pager [phone] (8a-5p)

## 2022-10-08 NOTE — Progress Notes (Signed)
Patient ID: Kathryn Beck, female   DOB: [birth year], 67 y.o.   MRN: [id number] Patient is a 67 year old woman who is postoperative day 1 left transtibial amputation.  She is also a right transtibial amputee.  There is no drainage in the wound VAC canister.  Anticipate discharge to skilled nursing.  Plan for dialysis while inpatient

## 2022-10-08 NOTE — Progress Notes (Signed)
[facility] Progress Note   Subjective:   Patient seen and examined at bedside. Increased pain in stump this AM.  Otherwise feeling ok.  Denies CP, SOB, abdominal pain and n/v/d.    Objective Vitals:   10/08/22 0025 10/08/22 0516 10/08/22 0531 10/08/22 0757  BP: (!) 162/71 (!) 160/69 (!) 160/69 (!) 141/56  Pulse: 77 80 80 74  Resp: 16   18  Temp: 97.6 F (36.4 C) 98.2 F (36.8 C) 98.2 F (36.8 C) 97.8 F (36.6 C)  TempSrc: Oral Oral Oral Oral  SpO2: 97% 96% 96% 100%  Weight:      Height:       Physical Exam General:chronically ill appearing female in NAD Heart:RRR, 2/6 systolic murmur Lungs:CTAB, nml WOB on RA Abdomen:soft, NTND Extremities:no LE edema Dialysis Access: LU AVF   Filed Weights   10/07/22 1119  Weight: 79.8 kg    Intake/Output Summary (Last 24 hours) at 10/08/2022 1012 Last data filed at 10/07/2022 2230 Gross per 24 hour  Intake 24.74 ml  Output 2450 ml  Net -2425.26 ml    Additional Objective Labs: Basic Metabolic Panel: Recent Labs  Lab 10/01/22 1208 10/07/22 1211  NA 141 139  K 3.9 5.3*  CL 98 107  CO2 25  --   GLUCOSE 278* 217*  BUN 63* 85*  CREATININE 7.38* 9.80*  CALCIUM 9.5  --    Liver Function Tests: Recent Labs  Lab 10/01/22 1208  AST 18  ALT 30  ALKPHOS 94  BILITOT 0.8  PROT 7.6  ALBUMIN 3.6   CBC: Recent Labs  Lab 10/01/22 1208 10/07/22 1211  WBC 7.7  --   NEUTROABS 5.6  --   HGB 10.2* 11.9*  HCT 34.2* 35.0*  MCV 81.4  --   PLT 250  --    Blood Culture    Component Value Date/Time   SDES WOUND 09/04/2022 1456   SPECREQUEST RESIDUAL FIRST METATARSAL 09/04/2022 1456   CULT  09/04/2022 1456    RARE ENTEROBACTER HORMAECHEI NO ANAEROBES ISOLATED Performed at [facility], [address], [place], [place] [address]    REPTSTATUS 09/09/2022 FINAL 09/04/2022 1456    CBG: Recent Labs  Lab 10/07/22 1324 10/07/22 1436 10/07/22 1749 10/08/22 0026 10/08/22 0753  GLUCAP 191* 184*  243* 108* 96     Medications:  sodium chloride Stopped (10/08/22 0700)   magnesium sulfate bolus IVPB      vitamin C  1,000 mg Oral Daily   aspirin  162 mg Oral Daily   calcitRIOL  0.25 mcg Oral BID WC   [START ON 10/09/2022] calcitRIOL  3 mcg Oral Q M,W,F-HD   cinacalcet  90 mg Oral Once per day on Monday Wednesday Friday   [START ON 10/09/2022] cinacalcet  90 mg Oral Q M,W,F-HD   docusate sodium  100 mg Oral Daily   DULoxetine  60 mg Oral Daily   ezetimibe  10 mg Oral Daily   insulin aspart  0-9 Units Subcutaneous TID WC   insulin aspart  3 Units Subcutaneous TID WC   insulin glargine-yfgn  20 Units Subcutaneous QHS   ketotifen  1 drop Both Eyes BID   metoprolol succinate  50 mg Oral Once per day on Sunday Tuesday Thursday Saturday   nutrition supplement (JUVEN)  1 packet Oral BID BM   pantoprazole  40 mg Oral Daily   pregabalin  50 mg Oral TID   sertraline  50 mg Oral Daily   sucroferric oxyhydroxide  1,000 mg Oral TID WC   zinc sulfate  220 mg Oral Daily    Dialysis Orders: outpatient HD orders (per previous admit): MWF at [facility] 3:45hr, 400/500, EDW 80g, 2K/2.5Ca, UFP#2, LUE AVF 15g, heparin 1000 unit initial + 1000 unit/hr Meds: Mircera 60 mcg every 2 weeks (last dose 7/8), Venofer 50 mg once weekly, calcitriol 3 mcg every treatment, Sensipar 90 mg every treatment  Assessment/Plan: 1. Wound dehiscence of L TMA - L BKA yesterday by Dr. Lajoyce Corners.  2. ESRD - on HD MWF.  HD tomorrow per regular schedule.   3. Anemia of CKD- Hgb 11.9.  No ESA needs at this time, next dose due on Monday.  4. Secondary hyperparathyroidism - Check Calcium and phos.  Continue VDRA, sensipar and binders.  5. HTN/volume - Blood pressure improving.  Continue home meds. Does not appear volume overloaded.  Will need new edw on discharge. 6. Nutrition - Renal diet with fluid restrictions.   Virgina Norfolk, PA-C [facility] 10/08/2022,10:12 AM  LOS: 1 day

## 2022-10-09 LAB — CBC
HCT: 27.3 % — ABNORMAL LOW (ref 36.0–46.0)
Hemoglobin: 8.4 g/dL — ABNORMAL LOW (ref 12.0–15.0)
MCH: 25.4 pg — ABNORMAL LOW (ref 26.0–34.0)
MCHC: 30.8 g/dL (ref 30.0–36.0)
MCV: 82.5 fL (ref 80.0–100.0)
Platelets: 198 10*3/uL (ref 150–400)
RBC: 3.31 MIL/uL — ABNORMAL LOW (ref 3.87–5.11)
RDW: 20 % — ABNORMAL HIGH (ref 11.5–15.5)
WBC: 7.8 10*3/uL (ref 4.0–10.5)
nRBC: 0.3 % — ABNORMAL HIGH (ref 0.0–0.2)

## 2022-10-09 LAB — RENAL FUNCTION PANEL
Albumin: 3 g/dL — ABNORMAL LOW (ref 3.5–5.0)
Anion gap: 19 — ABNORMAL HIGH (ref 5–15)
BUN: 64 mg/dL — ABNORMAL HIGH (ref 8–23)
CO2: 24 mmol/L (ref 22–32)
Calcium: 9 mg/dL (ref 8.9–10.3)
Chloride: 95 mmol/L — ABNORMAL LOW (ref 98–111)
Creatinine, Ser: 8.42 mg/dL — ABNORMAL HIGH (ref 0.44–1.00)
GFR, Estimated: 5 mL/min — ABNORMAL LOW (ref >60)
Glucose, Bld: 166 mg/dL — ABNORMAL HIGH (ref 70–99)
Phosphorus: 5.7 mg/dL — ABNORMAL HIGH (ref 2.5–4.6)
Potassium: 4.6 mmol/L (ref 3.5–5.1)
Sodium: 138 mmol/L (ref 135–145)

## 2022-10-09 LAB — GLUCOSE, CAPILLARY
Glucose-Capillary: 169 mg/dL — ABNORMAL HIGH (ref 70–99)
Glucose-Capillary: 172 mg/dL — ABNORMAL HIGH (ref 70–99)
Glucose-Capillary: 97 mg/dL (ref 70–99)
Glucose-Capillary: 86 mg/dL (ref 70–99)

## 2022-10-09 MED ORDER — PENTAFLUOROPROP-TETRAFLUOROETH EX AERO: 1.0000 | INHALATION_SPRAY | CUTANEOUS | Status: DC | PRN

## 2022-10-09 MED ORDER — ALTEPLASE 2 MG IJ SOLR: 2.0000 mg | Freq: Once | INTRAMUSCULAR | Status: DC | PRN

## 2022-10-09 MED ORDER — LIDOCAINE HCL (PF) 1 % IJ SOLN: 5.0000 mL | INTRAMUSCULAR | Status: DC | PRN

## 2022-10-09 MED ORDER — HEPARIN SODIUM (PORCINE) 1000 UNIT/ML DIALYSIS: 1000.0000 [IU] | INTRAMUSCULAR | Status: DC | PRN

## 2022-10-09 MED ORDER — ANTICOAGULANT SODIUM CITRATE 4% (200MG/5ML) IV SOLN: 5.0000 mL | Status: DC | PRN

## 2022-10-09 MED ORDER — LIDOCAINE-PRILOCAINE 2.5-2.5 % EX CREA: 1.0000 | TOPICAL_CREAM | CUTANEOUS | Status: DC | PRN

## 2022-10-09 NOTE — Plan of Care (Signed)
  Problem: Education: Goal: Ability to describe self-care measures that may prevent or decrease complications (Diabetes Survival Skills Education) will improve Outcome: Progressing Goal: Individualized Educational Video(s) Outcome: Progressing   Problem: Coping: Goal: Ability to adjust to condition or change in health will improve Outcome: Progressing   Problem: Fluid Volume: Goal: Ability to maintain a balanced intake and output will improve Outcome: Progressing   Problem: Health Behavior/Discharge Planning: Goal: Ability to identify and utilize available resources and services will improve Outcome: Progressing Goal: Ability to manage health-related needs will improve Outcome: Progressing   Problem: Metabolic: Goal: Ability to maintain appropriate glucose levels will improve Outcome: Progressing   Problem: Nutritional: Goal: Maintenance of adequate nutrition will improve Outcome: Progressing Goal: Progress toward achieving an optimal weight will improve Outcome: Progressing   Problem: Skin Integrity: Goal: Risk for impaired skin integrity will decrease Outcome: Progressing   Problem: Tissue Perfusion: Goal: Adequacy of tissue perfusion will improve Outcome: Progressing   Problem: Education: Goal: Knowledge of the prescribed therapeutic regimen will improve Outcome: Progressing Goal: Ability to verbalize activity precautions or restrictions will improve Outcome: Progressing Goal: Understanding of discharge needs will improve Outcome: Progressing   Problem: Activity: Goal: Ability to perform//tolerate increased activity and mobilize with assistive devices will improve Outcome: Progressing   Problem: Clinical Measurements: Goal: Postoperative complications will be avoided or minimized Outcome: Progressing   Problem: Self-Care: Goal: Ability to meet self-care needs will improve Outcome: Progressing   Problem: Self-Concept: Goal: Ability to maintain and perform  role responsibilities to the fullest extent possible will improve Outcome: Progressing   Problem: Pain Management: Goal: Pain level will decrease with appropriate interventions Outcome: Progressing   Problem: Skin Integrity: Goal: Demonstration of wound healing without infection will improve Outcome: Progressing   Problem: Education: Goal: Knowledge of General Education information will improve Description: Including pain rating scale, medication(s)/side effects and non-pharmacologic comfort measures Outcome: Progressing   Problem: Health Behavior/Discharge Planning: Goal: Ability to manage health-related needs will improve Outcome: Progressing   Problem: Clinical Measurements: Goal: Ability to maintain clinical measurements within normal limits will improve Outcome: Progressing Goal: Will remain free from infection Outcome: Progressing Goal: Diagnostic test results will improve Outcome: Progressing Goal: Respiratory complications will improve Outcome: Progressing Goal: Cardiovascular complication will be avoided Outcome: Progressing   Problem: Activity: Goal: Risk for activity intolerance will decrease Outcome: Progressing   Problem: Nutrition: Goal: Adequate nutrition will be maintained Outcome: Progressing   Problem: Coping: Goal: Level of anxiety will decrease Outcome: Progressing   Problem: Elimination: Goal: Will not experience complications related to bowel motility Outcome: Progressing Goal: Will not experience complications related to urinary retention Outcome: Progressing   Problem: Pain Managment: Goal: General experience of comfort will improve Outcome: Progressing   Problem: Safety: Goal: Ability to remain free from injury will improve Outcome: Progressing   Problem: Skin Integrity: Goal: Risk for impaired skin integrity will decrease Outcome: Progressing   

## 2022-10-09 NOTE — Progress Notes (Addendum)
Received patient in bed.Awake,alert and oriented x 4.Consent verified.  Access used: Left upper arm AVF that worked well.  Duration of treatment: 3 hours  Fluid removed : Achieved prescribed uf goal of 2 liters.  Hemo comment:Tolerated treatment.  Hand off to the patient's nurse.

## 2022-10-09 NOTE — Plan of Care (Signed)
  Problem: Coping: Goal: Ability to adjust to condition or change in health will improve Outcome: Progressing   Problem: Fluid Volume: Goal: Ability to maintain a balanced intake and output will improve Outcome: Progressing   Problem: Health Behavior/Discharge Planning: Goal: Ability to manage health-related needs will improve Outcome: Progressing   Problem: Nutritional: Goal: Maintenance of adequate nutrition will improve Outcome: Progressing   Problem: Tissue Perfusion: Goal: Adequacy of tissue perfusion will improve Outcome: Progressing   Problem: Self-Care: Goal: Ability to meet self-care needs will improve Outcome: Progressing   Problem: Education: Goal: Knowledge of General Education information will improve Description: Including pain rating scale, medication(s)/side effects and non-pharmacologic comfort measures Outcome: Progressing

## 2022-10-09 NOTE — Progress Notes (Signed)
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 PMH includes: R BKA, anemia, arthritis, CHF, ESRD on HD MWF, HTN, memory loss, obesity, OSA, stroke, and DM II.   OT comments  Pt continuing to progress in OT sessions, displaying better cognition than IE but still impaired from baseline. Pt needing Max A +2 for bed mobility and demonstrates poor EOB sitting balance that improved following dynamic reaching and cues to engage core and anterior leaning to progress balance. OOB mobility deferred today. OT to continue to progress pt as able, DC plans remain appropriate for CIR.   Recommendations for follow up therapy are one component of a multi-disciplinary discharge planning process, led by the attending physician.  Recommendations may be updated based on patient status, additional functional criteria and insurance authorization.    Assistance Recommended at Discharge Frequent or constant Supervision/Assistance  Patient can return home with the following  A lot of help with walking and/or transfers;A lot of help with bathing/dressing/bathroom;Assist for transportation;Help with stairs or ramp for entrance;Two people to help with walking and/or transfers;Assistance with cooking/housework   Equipment Recommendations  None recommended by OT (defer to next level of care)    Recommendations for Other Services      Precautions / Restrictions Precautions Precautions: Fall Precaution Comments: bilateral BKA Restrictions Weight Bearing Restrictions: Yes LLE Weight Bearing: Non weight bearing Other Position/Activity Restrictions: limb protector       Mobility Bed Mobility Overal bed mobility: Needs Assistance Bed Mobility: Supine to Sit, Sit to Supine     Supine to sit: Max assist, +2 for physical assistance,  HOB elevated Sit to supine: Max assist, +2 for physical assistance, HOB elevated   General bed mobility comments: pt with some attempt to follow max cues for transition to sitting EOB, but needing significant assist of 2 to complete and maintain sitting balance    Transfers                   General transfer comment: deferred due to inconsitent command following and lethargy in sitting     Balance Overall balance assessment: Needs assistance Sitting-balance support: Feet supported Sitting balance-Leahy Scale: Poor Sitting balance - Comments: dependent on at least minA, at times modA to maintain static sitting Postural control: Posterior lean             ADL either performed or assessed with clinical judgement   ADL Overall ADL's : Needs assistance/impaired     Grooming: Sitting;Wash/dry face;Min guard             General ADL Comments: Reinforced donning/doffing ampu shield. Focused session on dynamic reaching EOB to promote improved balance for OOB mobility      Cognition Arousal/Alertness: Lethargic, Suspect due to medications Behavior During Therapy: Flat affect Overall Cognitive Status: Impaired/Different from baseline Area of Impairment: Attention, Memory, Following commands, Safety/judgement, Awareness, Problem solving                   Current Attention Level: Focused Memory: Decreased short-term memory, Decreased recall of precautions Following Commands: Follows one step commands inconsistently, Follows one step commands with increased time Safety/Judgement: Decreased awareness of safety, Decreased awareness of deficits Awareness: Intellectual Problem Solving: Slow processing, Decreased initiation, Difficulty sequencing, Requires verbal cues General Comments: pt lethargic due to IV dilaudid. staring off for  most of session but would attend with max cues. following one-step commands intermittently and with max cues.                    Pertinent Vitals/ Pain       Pain Assessment Pain Assessment: Faces Faces Pain Scale: Hurts little more Pain Location: incision Pain Intervention(s): Monitored during session, Repositioned   Frequency  Min 1X/week        Progress Toward Goals  OT Goals(current goals can now be found in the care plan section)  Progress towards OT goals: Progressing toward goals  Acute Rehab OT Goals Time For Goal Achievement: 10/29/22 Potential to Achieve Goals: Good  Plan Discharge plan remains appropriate;Frequency remains appropriate    Co-evaluation    PT/OT/SLP Co-Evaluation/Treatment: Yes Reason for Co-Treatment: Necessary to address cognition/behavior during functional activity;For patient/therapist safety;To address functional/ADL transfers PT goals addressed during session: Mobility/safety with mobility;Balance;Strengthening/ROM OT goals addressed during session: ADL's and self-care      AM-PAC OT "6 Clicks" Daily Activity     Outcome Measure   Help from another person eating meals?: A Little Help from another person taking care of personal grooming?: A Lot Help from another person toileting, which includes using toliet, bedpan, or urinal?: A Lot Help from another person bathing (including washing, rinsing, drying)?: A Lot Help from another person to put on and taking off regular upper body clothing?: A Lot Help from another person to put on and taking off regular lower body clothing?: A Lot 6 Click Score: 13    End of Session    OT Visit Diagnosis: Unsteadiness on feet (R26.81);Other abnormalities of gait and mobility (R26.89)   Activity Tolerance Treatment limited secondary to medical complications (Comment)   Patient Left in bed;with call bell/phone within reach;Other (comment) (with transport in room)   Nurse Communication Need for lift equipment        Time: [phone] OT Time Calculation (min): 23 min  Charges: OT General Charges $OT Visit: 1 Visit OT  Treatments $Therapeutic Activity: 8-22 mins  10/09/2022  AB, OTR/L  Acute Rehabilitation Services  Office: [phone]   Tristan Schroeder 10/09/2022, 2:51 PM

## 2022-10-09 NOTE — Progress Notes (Signed)
Called daughter back for updates. Daughter satisfied w/ care plan.

## 2022-10-09 NOTE — Procedures (Signed)
Patient was seen on dialysis and the procedure was supervised.  BFR 400  Via AVF BP is  143/60.   Patient appears to be tolerating treatment well  Kathryn Beck Kathryn Beck 10/09/2022

## 2022-10-09 NOTE — Progress Notes (Signed)
Patient ID: Kathryn Beck, female   DOB: 07-28-1955, 67 y.o.   MRN: [id number]  POD 2 left BKA, VAC 0 cc, possible discharge to CIR

## 2022-10-09 NOTE — Progress Notes (Signed)
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 PMH includes: R BKA, anemia, arthritis, CHF, ESRD on HD MWF, HTN, memory loss, obesity, OSA, stroke, and DM II.    PT Comments  The pt was agreeable to session with focus on progressing OOB mobility, however, we were limited by arrival of transport to take the pt to HD session. The pt was able to complete bed mobility and sitting balance with less assistance than initial evaluation, but continues to demo poor seated balance and limited UE strength to complete lateral scooting along EOB. Pt needing modA of 2 to complete lateral scooting along EOB and repositioning into bed. Pt motivated to improve, continue to recommend inpatient rehab after d/c.      Assistance Recommended at Discharge Frequent or constant Supervision/Assistance  If plan is discharge home, recommend the following:  Can travel by private vehicle    Two people to help with walking and/or transfers;Two people to help with bathing/dressing/bathroom;Direct supervision/assist for medications management;Direct supervision/assist for financial management;Assist for transportation;Help with stairs or ramp for entrance   No  Equipment Recommendations  Other (comment) (defer to post acute)    Recommendations for Other Services Rehab consult     Precautions / Restrictions Precautions Precautions: Fall Precaution Comments: bilateral BKA Restrictions Weight Bearing Restrictions: Yes LLE Weight Bearing: Non weight bearing Other Position/Activity Restrictions: limb protector     Mobility  Bed Mobility Overal bed mobility: Needs Assistance Bed Mobility: Supine to Sit, Sit to Supine     Supine to sit: Max assist, +2 for physical assistance, HOB elevated Sit to supine: Max assist, +2 for physical  assistance, HOB elevated   General bed mobility comments: pt with some attempt to follow max cues for transition to sitting EOB, but needing significant assist of 2 to complete and maintain sitting balance    Transfers Overall transfer level: Needs assistance Equipment used: None Transfers: Bed to chair/wheelchair/BSC            Lateral/Scoot Transfers: Mod assist, +2 physical assistance General transfer comment: modA of 2 with assist from pt to scoot along EOB using bed pad. OOB to chair deferred due to arrival of transport to take pt to HD      Balance Overall balance assessment: Needs assistance Sitting-balance support: Feet supported Sitting balance-Leahy Scale: Fair Sitting balance - Comments: moments of minG, pt unable to maintain due to poor core strength Postural control: Posterior lean                                  Cognition Arousal/Alertness: Awake/alert Behavior During Therapy: Flat affect Overall Cognitive Status: Impaired/Different from baseline Area of Impairment: Attention, Memory, Following commands, Safety/judgement, Awareness, Problem solving                   Current Attention Level: Sustained Memory: Decreased short-term memory, Decreased recall of precautions Following Commands: Follows one step commands inconsistently, Follows one step commands with increased time Safety/Judgement: Decreased awareness of safety, Decreased awareness of deficits Awareness: Intellectual Problem Solving: Slow processing, Decreased initiation, Difficulty sequencing, Requires verbal cues General Comments: pt with improved alertness and able to follow simple commands >75% of time. pt continues to need cues for technique and safety. better awareness of situation and medical  plan (pt asking about timing of HD later today)        Exercises Other Exercises Other Exercises: seated core strengthening, forward reaching, trunk rotation    General Comments  General comments (skin integrity, edema, etc.): VSS on RA. BP measured when pt reporting dizziness, SBP 120-136      Pertinent Vitals/Pain Pain Assessment Pain Assessment: Faces Faces Pain Scale: Hurts little more Pain Location: incision Pain Descriptors / Indicators: Grimacing Pain Intervention(s): Limited activity within patient's tolerance, Monitored during session, Repositioned, RN gave pain meds during session     PT Goals (current goals can now be found in the care plan section) Acute Rehab PT Goals Patient Stated Goal: return home PT Goal Formulation: With patient/family Time For Goal Achievement: 10/22/22 Potential to Achieve Goals: Fair Progress towards PT goals: Progressing toward goals    Frequency    Min 3X/week      PT Plan Current plan remains appropriate    Co-evaluation PT/OT/SLP Co-Evaluation/Treatment: Yes Reason for Co-Treatment: Necessary to address cognition/behavior during functional activity;For patient/therapist safety;To address functional/ADL transfers PT goals addressed during session: Mobility/safety with mobility;Balance;Strengthening/ROM OT goals addressed during session: ADL's and self-care      AM-PAC PT "6 Clicks" Mobility   Outcome Measure  Help needed turning from your back to your side while in a flat bed without using bedrails?: Total Help needed moving from lying on your back to sitting on the side of a flat bed without using bedrails?: Total Help needed moving to and from a bed to a chair (including a wheelchair)?: Total Help needed standing up from a chair using your arms (e.g., wheelchair or bedside chair)?: Total Help needed to walk in hospital room?: Total Help needed climbing 3-5 steps with a railing? : Total 6 Click Score: 6    End of Session Equipment Utilized During Treatment: Oxygen Activity Tolerance: Patient tolerated treatment well Patient left: in bed;with call bell/phone within reach;with nursing/sitter in room  (transport, leaving to HD) Nurse Communication: Mobility status PT Visit Diagnosis: Other abnormalities of gait and mobility (R26.89);Muscle weakness (generalized) (M62.81)     Time: 7829-5621 PT Time Calculation (min) (ACUTE ONLY): 24 min  Charges:    $Therapeutic Activity: 8-22 mins PT General Charges $$ ACUTE PT VISIT: 1 Visit                     Vickki Muff, PT, DPT   Acute Rehabilitation Department Office [phone] Secure Chat Communication Preferred   Ronnie Derby 10/09/2022, 3:06 PM

## 2022-10-09 NOTE — Progress Notes (Signed)
[facility] Progress Note   Subjective:   Patient seen in room. Ongoing leg pain and not much of an appetite today. She is wearing O2 but denies any SOB. Denies CP, dizziness, HA, nausea.   Objective Vitals:   10/08/22 1332 10/09/22 0025 10/09/22 0550 10/09/22 0807  BP: (!) 153/63 (!) 128/53 (!) 140/61 (!) 153/65  Pulse: 76 77 76 73  Resp: 17  17 17   Temp: 98.4 F (36.9 C) 98.4 F (36.9 C) 98.2 F (36.8 C) 98.1 F (36.7 C)  TempSrc: Oral Oral Oral Oral  SpO2: 100% 100% 95% 100%  Weight:      Height:       Physical Exam General: Alert female in NAD Heart: RRR, 2/6 systolic murmur Lungs: O2 via [place], respirations unlabored, lungs CTA bilaterally  Abdomen: Soft, non-distended, +BS Extremities: No edema b/l lower extremities Dialysis Access: LUE AVF + bruit  Additional Objective Labs: Basic Metabolic Panel: Recent Labs  Lab 10/07/22 1211  NA 139  K 5.3*  CL 107  GLUCOSE 217*  BUN 85*  CREATININE 9.80*   Liver Function Tests: No results for input(s): "AST", "ALT", "ALKPHOS", "BILITOT", "PROT", "ALBUMIN" in the last 168 hours. No results for input(s): "LIPASE", "AMYLASE" in the last 168 hours. CBC: Recent Labs  Lab 10/07/22 1211  HGB 11.9*  HCT 35.0*   Blood Culture    Component Value Date/Time   SDES WOUND 09/04/2022 1456   SPECREQUEST RESIDUAL FIRST METATARSAL 09/04/2022 1456   CULT  09/04/2022 1456    RARE ENTEROBACTER HORMAECHEI NO ANAEROBES ISOLATED Performed at [facility], [address], [place], [place] [address]    REPTSTATUS 09/09/2022 FINAL 09/04/2022 1456    Cardiac Enzymes: No results for input(s): "CKTOTAL", "CKMB", "CKMBINDEX", "TROPONINI" in the last 168 hours. CBG: Recent Labs  Lab 10/08/22 0753 10/08/22 1122 10/08/22 1626 10/08/22 2159 10/09/22 0805  GLUCAP 96 257* 95 215* 169*   Iron Studies: No results for input(s): "IRON", "TIBC", "TRANSFERRIN", "FERRITIN" in the last 72  hours. @lablastinr3 @ Studies/Results: No results found. Medications:  magnesium sulfate bolus IVPB      vitamin C  1,000 mg Oral Daily   aspirin  162 mg Oral Daily   calcitRIOL  0.25 mcg Oral BID WC   calcitRIOL  3 mcg Oral Q M,W,F-HD   cinacalcet  90 mg Oral Q M,W,F-HD   docusate sodium  100 mg Oral Daily   DULoxetine  60 mg Oral Daily   ezetimibe  10 mg Oral Daily   insulin aspart  0-9 Units Subcutaneous TID WC   insulin aspart  3 Units Subcutaneous TID WC   insulin glargine-yfgn  20 Units Subcutaneous QHS   ketotifen  1 drop Both Eyes BID   metoprolol succinate  50 mg Oral Once per day on Sunday Tuesday Thursday Saturday   nutrition supplement (JUVEN)  1 packet Oral BID BM   pantoprazole  40 mg Oral Daily   pregabalin  50 mg Oral TID   sertraline  50 mg Oral Daily   sucroferric oxyhydroxide  1,000 mg Oral TID WC   zinc sulfate  220 mg Oral Daily    Dialysis Orders: outpatient HD orders (per previous admit): MWF at [facility] [place] 3:45hr, 400/500, EDW 80g, 2K/2.5Ca, UFP#2, LUE AVF 15g, heparin 1000 unit initial + 1000 unit/hr Meds: Mircera 60 mcg every 2 weeks (last dose 7/8), Venofer 50 mg once weekly, calcitriol 3 mcg every treatment, Sensipar 90 mg every treatment  Assessment/Plan: 1. Wound dehiscence  of L TMA - s/p L BKA  by Dr. Lajoyce Corners.  2. ESRD - on HD MWF.  HD today per regular schedule.   3. Anemia of CKD- Hgb 11.9.  No ESA needs at this time, next dose due on Monday.  4. Secondary hyperparathyroidism - Check Calcium and phos.  Continue VDRA, sensipar and binders.  5. HTN/volume - Blood pressure improving.  Continue home meds. Does not appear volume overloaded but is on O2 today- challenge UF with HD as tolerated.  Will need new edw on discharge. 6. Nutrition - Renal diet with fluid restrictions.      [name] Blocker, PA-C 10/09/2022, 9:20 AM  [employer] Pager: [phone]

## 2022-10-10 LAB — GLUCOSE, CAPILLARY
Glucose-Capillary: 113 mg/dL — ABNORMAL HIGH (ref 70–99)
Glucose-Capillary: 150 mg/dL — ABNORMAL HIGH (ref 70–99)
Glucose-Capillary: 163 mg/dL — ABNORMAL HIGH (ref 70–99)
Glucose-Capillary: 195 mg/dL — ABNORMAL HIGH (ref 70–99)
Glucose-Capillary: 224 mg/dL — ABNORMAL HIGH (ref 70–99)

## 2022-10-10 LAB — CBC
HCT: 27.7 % — ABNORMAL LOW (ref 36.0–46.0)
Hemoglobin: 8.4 g/dL — ABNORMAL LOW (ref 12.0–15.0)
MCH: 24.5 pg — ABNORMAL LOW (ref 26.0–34.0)
MCHC: 30.3 g/dL (ref 30.0–36.0)
MCV: 80.8 fL (ref 80.0–100.0)
Platelets: 202 10*3/uL (ref 150–400)
RBC: 3.43 MIL/uL — ABNORMAL LOW (ref 3.87–5.11)
RDW: 19.7 % — ABNORMAL HIGH (ref 11.5–15.5)
WBC: 7.2 10*3/uL (ref 4.0–10.5)
nRBC: 0.3 % — ABNORMAL HIGH (ref 0.0–0.2)

## 2022-10-10 NOTE — Progress Notes (Signed)
Patient ID: Kathryn Beck, female   DOB: June 26, 1955, 67 y.o.   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  There is no drainage in the wound VAC canister.  Plan for discharge to inpatient rehab versus skilled nursing.

## 2022-10-10 NOTE — Progress Notes (Signed)
[facility] Progress Note   Subjective:   Reports HD went well yesterday, better pain control today. She is on O2 but denies SOB, says she feels like she has mucus in her throat. It does appear O2 sats were ok on room air. Denies CP, palpitations and dizziness.   Objective Vitals:   10/09/22 1819 10/09/22 1909 10/10/22 0412 10/10/22 0820  BP: (!) 142/62 (!) 150/71 134/60 (!) 125/50  Pulse: 76 77 74 76  Resp: 16 17 17 16   Temp: 98.3 F (36.8 C) 98.4 F (36.9 C) 98 F (36.7 C) 98.4 F (36.9 C)  TempSrc: Oral Oral Oral Oral  SpO2: 100% 98% 95% 98%  Weight:      Height:       Physical Exam General: Alert female in NAD Heart: RRR, 2/6 systolic murmur Lungs: O2 via [place], respirations unlabored, lungs CTA bilaterally  Abdomen: Soft, non-distended, +BS Extremities: No edema b/l lower extremities Dialysis Access: LUE AVF + bruit  Additional Objective Labs: Basic Metabolic Panel: Recent Labs  Lab 10/07/22 1211 10/09/22 1035  NA 139 138  K 5.3* 4.6  CL 107 95*  CO2  --  24  GLUCOSE 217* 166*  BUN 85* 64*  CREATININE 9.80* 8.42*  CALCIUM  --  9.0  PHOS  --  5.7*   Liver Function Tests: Recent Labs  Lab 10/09/22 1035  ALBUMIN 3.0*   No results for input(s): "LIPASE", "AMYLASE" in the last 168 hours. CBC: Recent Labs  Lab 10/07/22 1211 10/09/22 1410 10/10/22 0231  WBC  --  7.8 7.2  HGB 11.9* 8.4* 8.4*  HCT 35.0* 27.3* 27.7*  MCV  --  82.5 80.8  PLT  --  198 202   Blood Culture    Component Value Date/Time   SDES WOUND 09/04/2022 1456   SPECREQUEST RESIDUAL FIRST METATARSAL 09/04/2022 1456   CULT  09/04/2022 1456    RARE ENTEROBACTER HORMAECHEI NO ANAEROBES ISOLATED Performed at [facility], [address] [place], [place] [address]    REPTSTATUS 09/09/2022 FINAL 09/04/2022 1456    Cardiac Enzymes: No results for input(s): "CKTOTAL", "CKMB", "CKMBINDEX", "TROPONINI" in the last 168 hours. CBG: Recent Labs  Lab 10/09/22 1125  10/09/22 1822 10/09/22 1911 10/10/22 0415 10/10/22 0830  GLUCAP 172* 97 86 113* 150*   Iron Studies: No results for input(s): "IRON", "TIBC", "TRANSFERRIN", "FERRITIN" in the last 72 hours. @lablastinr3 @ Studies/Results: No results found. Medications:  magnesium sulfate bolus IVPB      vitamin C  1,000 mg Oral Daily   aspirin  162 mg Oral Daily   calcitRIOL  0.25 mcg Oral BID WC   calcitRIOL  3 mcg Oral Q M,W,F-HD   cinacalcet  90 mg Oral Q M,W,F-HD   docusate sodium  100 mg Oral Daily   DULoxetine  60 mg Oral Daily   ezetimibe  10 mg Oral Daily   insulin aspart  0-9 Units Subcutaneous TID WC   insulin aspart  3 Units Subcutaneous TID WC   insulin glargine-yfgn  20 Units Subcutaneous QHS   ketotifen  1 drop Both Eyes BID   metoprolol succinate  50 mg Oral Once per day on Sunday Tuesday Thursday Saturday   nutrition supplement (JUVEN)  1 packet Oral BID BM   pantoprazole  40 mg Oral Daily   pregabalin  50 mg Oral TID   sertraline  50 mg Oral Daily   sucroferric oxyhydroxide  1,000 mg Oral TID WC   zinc sulfate  220  mg Oral Daily    Dialysis Orders: outpatient HD orders (per previous admit): MWF at [facility] [place] 3:45hr, 400/500, EDW 80g, 2K/2.5Ca, UFP#2, LUE AVF 15g, heparin 1000 unit initial + 1000 unit/hr Meds: Mircera 60 mcg every 2 weeks (last dose 7/8), Venofer 50 mg once weekly, calcitriol 3 mcg every treatment, Sensipar 90 mg every treatment    Assessment/Plan: 1. 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 Lajoyce Corners.  2. ESRD - on HD MWF.  Tolerating well, continue regular schedule  3. Anemia of CKD- Hgb 8.4- declined post op. No ESA needs at this time, next dose due on Monday.  4. Secondary hyperparathyroidism - Calcium controlled, phos close to goal. Continue VDRA, sensipar and binders.  5. HTN/volume - Blood pressure improving.  Continue home meds.Will need new edw on discharge. 6. Nutrition - Renal diet with fluid restrictions.   [name] Blocker,  PA-C 10/10/2022, 9:55 AM  [facility] Pager: [phone]

## 2022-10-10 NOTE — Plan of Care (Signed)
  Problem: Coping: Goal: Ability to adjust to condition or change in health will improve Outcome: Progressing   Problem: Fluid Volume: Goal: Ability to maintain a balanced intake and output will improve Outcome: Progressing   Problem: Health Behavior/Discharge Planning: Goal: Ability to identify and utilize available resources and services will improve Outcome: Progressing Goal: Ability to manage health-related needs will improve Outcome: Progressing   Problem: Metabolic: Goal: Ability to maintain appropriate glucose levels will improve Outcome: Progressing   Problem: Nutritional: Goal: Maintenance of adequate nutrition will improve Outcome: Progressing Goal: Progress toward achieving an optimal weight will improve Outcome: Progressing   Problem: Skin Integrity: Goal: Risk for impaired skin integrity will decrease Outcome: Progressing   Problem: Tissue Perfusion: Goal: Adequacy of tissue perfusion will improve Outcome: Progressing   Problem: Education: Goal: Knowledge of the prescribed therapeutic regimen will improve Outcome: Progressing Goal: Ability to verbalize activity precautions or restrictions will improve Outcome: Progressing Goal: Understanding of discharge needs will improve Outcome: Progressing   Problem: Activity: Goal: Ability to perform//tolerate increased activity and mobilize with assistive devices will improve Outcome: Progressing   Problem: Clinical Measurements: Goal: Postoperative complications will be avoided or minimized Outcome: Progressing

## 2022-10-11 LAB — CBC
HCT: 27.3 % — ABNORMAL LOW (ref 36.0–46.0)
Hemoglobin: 8.2 g/dL — ABNORMAL LOW (ref 12.0–15.0)
MCH: 24.4 pg — ABNORMAL LOW (ref 26.0–34.0)
MCHC: 30 g/dL (ref 30.0–36.0)
MCV: 81.3 fL (ref 80.0–100.0)
Platelets: 209 10*3/uL (ref 150–400)
RBC: 3.36 MIL/uL — ABNORMAL LOW (ref 3.87–5.11)
RDW: 19.3 % — ABNORMAL HIGH (ref 11.5–15.5)
WBC: 6.5 10*3/uL (ref 4.0–10.5)
nRBC: 0 % (ref 0.0–0.2)

## 2022-10-11 LAB — GLUCOSE, CAPILLARY
Glucose-Capillary: 312 mg/dL — ABNORMAL HIGH (ref 70–99)
Glucose-Capillary: 180 mg/dL — ABNORMAL HIGH (ref 70–99)
Glucose-Capillary: 159 mg/dL — ABNORMAL HIGH (ref 70–99)
Glucose-Capillary: 231 mg/dL — ABNORMAL HIGH (ref 70–99)

## 2022-10-11 MED ORDER — DARBEPOETIN ALFA 100 MCG/0.5ML IJ SOSY
100.0000 ug | PREFILLED_SYRINGE | INTRAMUSCULAR | Status: DC
  Administered 2022-10-12: 100 ug via SUBCUTANEOUS
  Filled 2022-10-11: qty 0.5

## 2022-10-11 MED ORDER — INSULIN ASPART 100 UNIT/ML IJ SOLN
8.0000 [IU] | Freq: Once | INTRAMUSCULAR | Status: AC
  Administered 2022-10-11: 8 [IU] via SUBCUTANEOUS

## 2022-10-11 MED ORDER — CHLORHEXIDINE GLUCONATE CLOTH 2 % EX PADS
6.0000 | MEDICATED_PAD | Freq: Every day | CUTANEOUS | Status: DC
  Administered 2022-10-11 – 2022-10-14 (×4): 6 via TOPICAL

## 2022-10-11 NOTE — Plan of Care (Signed)
  Problem: Education: Goal: Ability to describe self-care measures that may prevent or decrease complications (Diabetes Survival Skills Education) will improve Outcome: Progressing Goal: Individualized Educational Video(s) Outcome: Progressing   Problem: Coping: Goal: Ability to adjust to condition or change in health will improve Outcome: Progressing   Problem: Fluid Volume: Goal: Ability to maintain a balanced intake and output will improve Outcome: Progressing   Problem: Health Behavior/Discharge Planning: Goal: Ability to identify and utilize available resources and services will improve Outcome: Progressing Goal: Ability to manage health-related needs will improve Outcome: Progressing   Problem: Metabolic: Goal: Ability to maintain appropriate glucose levels will improve Outcome: Progressing   Problem: Nutritional: Goal: Maintenance of adequate nutrition will improve Outcome: Progressing Goal: Progress toward achieving an optimal weight will improve Outcome: Progressing   Problem: Skin Integrity: Goal: Risk for impaired skin integrity will decrease Outcome: Progressing   Problem: Tissue Perfusion: Goal: Adequacy of tissue perfusion will improve Outcome: Progressing   Problem: Education: Goal: Knowledge of the prescribed therapeutic regimen will improve Outcome: Progressing Goal: Ability to verbalize activity precautions or restrictions will improve Outcome: Progressing Goal: Understanding of discharge needs will improve Outcome: Progressing   Problem: Activity: Goal: Ability to perform//tolerate increased activity and mobilize with assistive devices will improve Outcome: Progressing   Problem: Clinical Measurements: Goal: Postoperative complications will be avoided or minimized Outcome: Progressing   Problem: Self-Care: Goal: Ability to meet self-care needs will improve Outcome: Progressing   Problem: Self-Concept: Goal: Ability to maintain and perform  role responsibilities to the fullest extent possible will improve Outcome: Progressing   Problem: Pain Management: Goal: Pain level will decrease with appropriate interventions Outcome: Progressing   Problem: Skin Integrity: Goal: Demonstration of wound healing without infection will improve Outcome: Progressing   Problem: Education: Goal: Knowledge of General Education information will improve Description: Including pain rating scale, medication(s)/side effects and non-pharmacologic comfort measures Outcome: Progressing   Problem: Health Behavior/Discharge Planning: Goal: Ability to manage health-related needs will improve Outcome: Progressing   Problem: Clinical Measurements: Goal: Ability to maintain clinical measurements within normal limits will improve Outcome: Progressing Goal: Will remain free from infection Outcome: Progressing Goal: Diagnostic test results will improve Outcome: Progressing Goal: Respiratory complications will improve Outcome: Progressing Goal: Cardiovascular complication will be avoided Outcome: Progressing   Problem: Activity: Goal: Risk for activity intolerance will decrease Outcome: Progressing   Problem: Nutrition: Goal: Adequate nutrition will be maintained Outcome: Progressing   Problem: Coping: Goal: Level of anxiety will decrease Outcome: Progressing   Problem: Elimination: Goal: Will not experience complications related to bowel motility Outcome: Progressing Goal: Will not experience complications related to urinary retention Outcome: Progressing   Problem: Pain Managment: Goal: General experience of comfort will improve Outcome: Progressing   Problem: Safety: Goal: Ability to remain free from injury will improve Outcome: Progressing   Problem: Skin Integrity: Goal: Risk for impaired skin integrity will decrease Outcome: Progressing   

## 2022-10-11 NOTE — Progress Notes (Signed)
[facility] Progress Note   Subjective:   Continues to improve, reports mild pain today. On RA and denies SOB. Denies CP, palpitations, dizziness. Waiting for rehab bed.   Objective Vitals:   10/10/22 1444 10/10/22 1926 10/11/22 0448 10/11/22 0826  BP: (!) 113/42 (!) 117/43 (!) 152/51 (!) 170/71  Pulse: 77 78 72 73  Resp: 16 17 15 16   Temp: 98.2 F (36.8 C) 98.4 F (36.9 C)  98.1 F (36.7 C)  TempSrc: Oral Oral  Oral  SpO2: 100% 97%  100%  Weight:      Height:       Physical Exam General: Alert female in NAD Heart: RRR, 2/6 systolic murmur Lungs: O2 via [place], respirations unlabored, lungs CTA bilaterally  Abdomen: Soft, non-distended, +BS Extremities: B/l amputations, wound vac on R leg. No edema b/l lower extremities Dialysis Access: LUE AVF + bruit  Additional Objective Labs: Basic Metabolic Panel: Recent Labs  Lab 10/07/22 1211 10/09/22 1035  NA 139 138  K 5.3* 4.6  CL 107 95*  CO2  --  24  GLUCOSE 217* 166*  BUN 85* 64*  CREATININE 9.80* 8.42*  CALCIUM  --  9.0  PHOS  --  5.7*   Liver Function Tests: Recent Labs  Lab 10/09/22 1035  ALBUMIN 3.0*   No results for input(s): "LIPASE", "AMYLASE" in the last 168 hours. CBC: Recent Labs  Lab 10/09/22 1410 10/10/22 0231 10/11/22 0220  WBC 7.8 7.2 6.5  HGB 8.4* 8.4* 8.2*  HCT 27.3* 27.7* 27.3*  MCV 82.5 80.8 81.3  PLT 198 202 209   Blood Culture    Component Value Date/Time   SDES WOUND 09/04/2022 1456   SPECREQUEST RESIDUAL FIRST METATARSAL 09/04/2022 1456   CULT  09/04/2022 1456    RARE ENTEROBACTER HORMAECHEI NO ANAEROBES ISOLATED Performed at [facility], [address] [place], [place] [address]    REPTSTATUS 09/09/2022 FINAL 09/04/2022 1456    Cardiac Enzymes: No results for input(s): "CKTOTAL", "CKMB", "CKMBINDEX", "TROPONINI" in the last 168 hours. CBG: Recent Labs  Lab 10/10/22 0830 10/10/22 1116 10/10/22 1604 10/10/22 2133 10/11/22 0756  GLUCAP 150*  163* 195* 224* 312*    Medications:  magnesium sulfate bolus IVPB      vitamin C  1,000 mg Oral Daily   aspirin  162 mg Oral Daily   calcitRIOL  0.25 mcg Oral BID WC   calcitRIOL  3 mcg Oral Q M,W,F-HD   cinacalcet  90 mg Oral Q M,W,F-HD   docusate sodium  100 mg Oral Daily   DULoxetine  60 mg Oral Daily   ezetimibe  10 mg Oral Daily   insulin aspart  0-9 Units Subcutaneous TID WC   insulin aspart  3 Units Subcutaneous TID WC   insulin aspart  8 Units Subcutaneous Once   insulin glargine-yfgn  20 Units Subcutaneous QHS   ketotifen  1 drop Both Eyes BID   metoprolol succinate  50 mg Oral Once per day on Sunday Tuesday Thursday Saturday   nutrition supplement (JUVEN)  1 packet Oral BID BM   pantoprazole  40 mg Oral Daily   pregabalin  50 mg Oral TID   sertraline  50 mg Oral Daily   sucroferric oxyhydroxide  1,000 mg Oral TID WC   zinc sulfate  220 mg Oral Daily    Dialysis Orders: outpatient HD orders (per previous admit): MWF at [facility] [place] 3:45hr, 400/500, EDW 80g, 2K/2.5Ca, UFP#2, LUE AVF 15g, heparin 1000 unit initial +  1000 unit/hr Meds: Mircera 60 mcg every 2 weeks (last dose 7/8), Venofer 50 mg once weekly, calcitriol 3 mcg every treatment, Sensipar 90 mg every treatment  Assessment/Plan: 1. 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 Lajoyce Corners. Awaiting rehab bed.  2. ESRD - on HD MWF.  Tolerating well, continue regular schedule  3. Anemia of CKD- Hgb 8.2- declined post op. Ordered aranesp to start 7/22 4. Secondary hyperparathyroidism - Calcium controlled, phos close to goal. Continue VDRA, sensipar and binders.  5. HTN/volume - Blood pressure variable, elevated today.  Continue home meds.Will need new edw on discharge. 6. Nutrition - Renal diet with fluid restrictions.    [name] Blocker, PA-C 10/11/2022, 10:04 AM  [facility] Pager: [phone]

## 2022-10-11 NOTE — Progress Notes (Signed)
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 PMH includes: R BKA, anemia, arthritis, CHF, ESRD on HD MWF, HTN, memory loss, obesity, OSA, stroke, and DM II.   OT comments  Attempted to progress pt with OOB mobility, but due to consistent jerky movements impacting balance it made OOB mobility unsafe without an extra therapist present. Pt needing Mod A for lateral scooting in bed, not enough strength to turn body to perform a posterior transfer. Pt does seem to have improved cognition compared to IE but continues to have decreased safety awareness. OT to continue to progress pt as able, DC plans remain appropriate for AIR.    Recommendations for follow up therapy are one component of a multi-disciplinary discharge planning process, led by the attending physician.  Recommendations may be updated based on patient status, additional functional criteria and insurance authorization.    Assistance Recommended at Discharge    Patient can return home with the following  A lot of help with walking and/or transfers;A lot of help with bathing/dressing/bathroom;Assist for transportation;Help with stairs or ramp for entrance;Two people to help with walking and/or transfers;Assistance with cooking/housework   Equipment Recommendations  None recommended by OT (defer to next level of care)    Recommendations for Other Services      Precautions / Restrictions Precautions Precautions: Fall Precaution Comments: bilateral BKA Restrictions Weight Bearing Restrictions: Yes LLE Weight Bearing: Non weight bearing Other Position/Activity Restrictions: limb protector       Mobility Bed Mobility Overal bed mobility: Needs Assistance Bed Mobility: Sidelying to Sit, Rolling, Sit to Supine Rolling: Min guard Sidelying to sit:  Min assist   Sit to supine: Mod assist   General bed mobility comments: Pt needing assist with BLE movement when returning to supine, hard time maintaining trunk control with mobility due to constant jerky movements    Transfers Overall transfer level: Needs assistance                Lateral/Scoot Transfers: Mod assist General transfer comment: Attempted bed>recliner transfer via lateral scooting pt needing Mod A with use of pads to safely help scoot. Transfer deferred as pt with impaired balance due to consistent jekry movements and LOBs on bed during mobility. Likley needing +2 for safety with transfer on next attempt to manage trunk     Balance Overall balance assessment: Needs assistance Sitting-balance support: Feet supported Sitting balance-Leahy Scale: Fair Sitting balance - Comments: moments of minG, pt unable to maintain due to poor core strength Postural control: Posterior lean             ADL either performed or assessed with clinical judgement   ADL         General ADL Comments: Reinforced donning/doffing ampu shield. Focused session on progressing OOB mobility      Cognition Arousal/Alertness: Awake/alert Behavior During Therapy: WFL for tasks assessed/performed Overall Cognitive Status: Impaired/Different from baseline Area of Impairment: Safety/judgement, Problem solving           Safety/Judgement: Decreased awareness of safety   Problem Solving: Slow processing General Comments: Pt noted to have improved awareness on this session, still exhibits some mild slow processing but overall cognitively intact              General Comments VSS on RA    Pertinent Vitals/ Pain  Pain Assessment Pain Assessment: Faces Faces Pain Scale: Hurts little more Pain Location: RLE Pain Descriptors / Indicators: Sore Pain Intervention(s): Monitored during session, Repositioned   Frequency  Min 1X/week        Progress Toward Goals  OT  Goals(current goals can now be found in the care plan section)  Progress towards OT goals: Progressing toward goals  Acute Rehab OT Goals Time For Goal Achievement: 10/29/22 Potential to Achieve Goals: Good  Plan Discharge plan remains appropriate;Frequency remains appropriate       AM-PAC OT "6 Clicks" Daily Activity     Outcome Measure   Help from another person eating meals?: None Help from another person taking care of personal grooming?: A Lot Help from another person toileting, which includes using toliet, bedpan, or urinal?: A Lot Help from another person bathing (including washing, rinsing, drying)?: A Lot Help from another person to put on and taking off regular upper body clothing?: A Lot Help from another person to put on and taking off regular lower body clothing?: A Lot 6 Click Score: 14    End of Session    OT Visit Diagnosis: Unsteadiness on feet (R26.81);Other abnormalities of gait and mobility (R26.89)   Activity Tolerance Patient tolerated treatment well   Patient Left in bed;with call bell/phone within reach   Nurse Communication Need for lift equipment        Time: 4098-1191 OT Time Calculation (min): 29 min  Charges: OT General Charges $OT Visit: 1 Visit OT Treatments $Therapeutic Activity: 23-37 mins  10/11/2022  AB, OTR/L  Acute Rehabilitation Services  Office: [phone]   Tristan Schroeder 10/11/2022, 4:30 PM

## 2022-10-11 NOTE — Progress Notes (Signed)
Orthopedic progress note  No acute events overnight.  Pain well-controlled.  No complaints this morning.  No acute distress.  Alert, answering questions appropriately, following commands.  Unlabored breathing on room air.  VAC in place with good seal and no evidence of leak.  Assessment: 67 year old female status post left BKA  Plan: -Operative plans complete -NWB LLE -PT evaluate and treat -Keep VAC in place at all times -D/C to IPR when bed available   London Sheer, MD Orthopedic Surgeon

## 2022-10-11 NOTE — Plan of Care (Signed)
  Problem: Education: Goal: Ability to describe self-care measures that may prevent or decrease complications (Diabetes Survival Skills Education) will improve Outcome: Progressing Goal: Individualized Educational Video(s) Outcome: Progressing   Problem: Coping: Goal: Ability to adjust to condition or change in health will improve Outcome: Progressing   Problem: Fluid Volume: Goal: Ability to maintain a balanced intake and output will improve Outcome: Progressing   Problem: Health Behavior/Discharge Planning: Goal: Ability to identify and utilize available resources and services will improve Outcome: Progressing Goal: Ability to manage health-related needs will improve Outcome: Progressing   Problem: Metabolic: Goal: Ability to maintain appropriate glucose levels will improve Outcome: Progressing   Problem: Nutritional: Goal: Maintenance of adequate nutrition will improve Outcome: Progressing Goal: Progress toward achieving an optimal weight will improve Outcome: Progressing   Problem: Skin Integrity: Goal: Risk for impaired skin integrity will decrease Outcome: Progressing   Problem: Tissue Perfusion: Goal: Adequacy of tissue perfusion will improve Outcome: Progressing   Problem: Education: Goal: Knowledge of the prescribed therapeutic regimen will improve Outcome: Progressing Goal: Ability to verbalize activity precautions or restrictions will improve Outcome: Progressing Goal: Understanding of discharge needs will improve Outcome: Progressing   Problem: Activity: Goal: Ability to perform//tolerate increased activity and mobilize with assistive devices will improve Outcome: Progressing   Problem: Clinical Measurements: Goal: Postoperative complications will be avoided or minimized Outcome: Progressing   Problem: Self-Care: Goal: Ability to meet self-care needs will improve Outcome: Progressing   Problem: Self-Concept: Goal: Ability to maintain and perform  role responsibilities to the fullest extent possible will improve Outcome: Progressing   Problem: Pain Management: Goal: Pain level will decrease with appropriate interventions Outcome: Progressing   Problem: Skin Integrity: Goal: Demonstration of wound healing without infection will improve Outcome: Progressing   Problem: Education: Goal: Knowledge of General Education information will improve Description: Including pain rating scale, medication(s)/side effects and non-pharmacologic comfort measures Outcome: Progressing   Problem: Health Behavior/Discharge Planning: Goal: Ability to manage health-related needs will improve Outcome: Progressing   Problem: Clinical Measurements: Goal: Ability to maintain clinical measurements within normal limits will improve Outcome: Progressing Goal: Will remain free from infection Outcome: Progressing Goal: Diagnostic test results will improve Outcome: Progressing Goal: Respiratory complications will improve Outcome: Progressing Goal: Cardiovascular complication will be avoided Outcome: Progressing   Problem: Activity: Goal: Risk for activity intolerance will decrease Outcome: Progressing   Problem: Nutrition: Goal: Adequate nutrition will be maintained Outcome: Progressing   Problem: Coping: Goal: Level of anxiety will decrease Outcome: Progressing

## 2022-10-12 ENCOUNTER — Ambulatory Visit: Payer: Self-pay | Admitting: *Deleted

## 2022-10-12 ENCOUNTER — Encounter: Payer: Self-pay | Admitting: *Deleted

## 2022-10-12 LAB — CBC
HCT: 26.5 % — ABNORMAL LOW (ref 36.0–46.0)
Hemoglobin: 8 g/dL — ABNORMAL LOW (ref 12.0–15.0)
MCH: 24.2 pg — ABNORMAL LOW (ref 26.0–34.0)
MCHC: 30.2 g/dL (ref 30.0–36.0)
MCV: 80.3 fL (ref 80.0–100.0)
Platelets: 241 10*3/uL (ref 150–400)
RBC: 3.3 MIL/uL — ABNORMAL LOW (ref 3.87–5.11)
RDW: 19.3 % — ABNORMAL HIGH (ref 11.5–15.5)
WBC: 6.6 10*3/uL (ref 4.0–10.5)
nRBC: 0 % (ref 0.0–0.2)

## 2022-10-12 LAB — RENAL FUNCTION PANEL
Albumin: 2.7 g/dL — ABNORMAL LOW (ref 3.5–5.0)
Anion gap: 11 (ref 5–15)
BUN: 96 mg/dL — ABNORMAL HIGH (ref 8–23)
CO2: 25 mmol/L (ref 22–32)
Calcium: 9.1 mg/dL (ref 8.9–10.3)
Chloride: 102 mmol/L (ref 98–111)
Creatinine, Ser: 10.08 mg/dL — ABNORMAL HIGH (ref 0.44–1.00)
GFR, Estimated: 4 mL/min — ABNORMAL LOW (ref >60)
Glucose, Bld: 171 mg/dL — ABNORMAL HIGH (ref 70–99)
Phosphorus: 5.9 mg/dL — ABNORMAL HIGH (ref 2.5–4.6)
Potassium: 4.9 mmol/L (ref 3.5–5.1)
Sodium: 138 mmol/L (ref 135–145)

## 2022-10-12 LAB — SURGICAL PATHOLOGY

## 2022-10-12 LAB — GLUCOSE, CAPILLARY
Glucose-Capillary: 165 mg/dL — ABNORMAL HIGH (ref 70–99)
Glucose-Capillary: 196 mg/dL — ABNORMAL HIGH (ref 70–99)
Glucose-Capillary: 163 mg/dL — ABNORMAL HIGH (ref 70–99)
Glucose-Capillary: 157 mg/dL — ABNORMAL HIGH (ref 70–99)

## 2022-10-12 MED ORDER — LIDOCAINE-PRILOCAINE 2.5-2.5 % EX CREA: 1.0000 | TOPICAL_CREAM | CUTANEOUS | Status: DC | PRN

## 2022-10-12 MED ORDER — HEPARIN SODIUM (PORCINE) 1000 UNIT/ML DIALYSIS: 1000.0000 [IU] | INTRAMUSCULAR | Status: DC | PRN

## 2022-10-12 MED ORDER — LIDOCAINE HCL (PF) 1 % IJ SOLN: 5.0000 mL | INTRAMUSCULAR | Status: DC | PRN

## 2022-10-12 MED ORDER — ALTEPLASE 2 MG IJ SOLR: 2.0000 mg | Freq: Once | INTRAMUSCULAR | Status: DC | PRN

## 2022-10-12 MED ORDER — ANTICOAGULANT SODIUM CITRATE 4% (200MG/5ML) IV SOLN: 5.0000 mL | Status: DC | PRN

## 2022-10-12 MED ORDER — INSULIN GLARGINE-YFGN 100 UNIT/ML ~~LOC~~ SOLN
30.0000 [IU] | Freq: Every day | SUBCUTANEOUS | Status: DC
  Administered 2022-10-12 – 2022-10-13 (×2): 30 [IU] via SUBCUTANEOUS
  Filled 2022-10-12 (×3): qty 0.3

## 2022-10-12 MED ORDER — ASPIRIN 325 MG PO TBEC
325.0000 mg | DELAYED_RELEASE_TABLET | Freq: Every day | ORAL | Status: DC
  Administered 2022-10-13: 325 mg via ORAL
  Filled 2022-10-12 (×2): qty 1

## 2022-10-12 MED ORDER — PENTAFLUOROPROP-TETRAFLUOROETH EX AERO: 1.0000 | INHALATION_SPRAY | CUTANEOUS | Status: DC | PRN

## 2022-10-12 NOTE — Progress Notes (Signed)
PT Cancellation Note  Patient Details Name: Kathryn Beck MRN: [id number] DOB: [birth year]   Cancelled Treatment:    Reason Eval/Treat Not Completed: Patient at procedure or test/unavailable (pt off unit at HD this AM, will follow up at later time/date as schedule allows and pt able.)   Renaldo Fiddler PT, DPT Acute Rehabilitation Services Office [phone]  10/12/22 9:17 AM

## 2022-10-12 NOTE — Patient Outreach (Signed)
Care Coordination   Follow Up Visit Note   10/12/2022  Name: Kathryn Beck MRN: [id number] DOB: [birth year]  Kathryn Beck is a 67 y.o. year old female who sees [name], MD for primary care. I spoke with [name] by phone today.  What matters to the patients health and wellness today?  Receive Assistance Applying for Aid & Attendance Benefits, through [employer].   Goals Addressed             This Visit's Progress    Receive Assistance Applying for Aid & Attendance Benefits, through [employer].   On track    Care Coordination Interventions:  Interventions Today    Flowsheet Row Most Recent Value  Chronic Disease   Chronic disease during today's visit Diabetes, Hypertension (HTN), Chronic Kidney Disease/End Stage Renal Disease (ESRD), Congestive Heart Failure (CHF), Other  [Neuropathy, Chronic Pain Syndrome, Anxiety, Depression, Memory Loss, Neurocognitive Disorder & Requires Maximum Assistance with Activities of Daily Living]  General Interventions   General Interventions Discussed/Reviewed General Interventions Reviewed, Annual Eye Exam, General Interventions Discussed, Labs, Annual Foot Exam, Lipid Profile, Durable Medical Equipment (DME), Vaccines, Health Screening, [employer], Doctor Visits, Communication with, Level of Care  [Primary Care Provider]  Labs Hgb A1c every 3 months, Kidney Function  Vaccines COVID-19, Flu, Pneumonia, RSV, Shingles, Tetanus/Pertussis/Diphtheria  [Encouraged]  Doctor Visits Discussed/Reviewed Doctor Visits Discussed, Doctor Visits Reviewed, Annual Wellness Visits, PCP, Specialist  [Encouraged]  Health Screening Colonoscopy, Mammogram  [Encouraged]  Durable Medical Equipment (DME) BP Cuff, Glucomoter, Environmental consultant, Wheelchair, Physicist, medical  PCP/Specialist Visits Compliance with follow-up visit  Communication with PCP/Specialists, RN  Level of Care Adult Daycare, Air traffic controller,  Assisted Living, Skilled Nursing Facility, Teaching laboratory technician Medicaid, Personal Care Services  Exercise Interventions   Exercise Discussed/Reviewed Exercise Discussed, Exercise Reviewed, Physical Activity, Assistive device use and maintanence  Physical Activity Discussed/Reviewed Physical Activity Discussed, Physical Activity Reviewed, Types of exercise, Home Exercise Program (HEP)  [Encouraged]  Education Interventions   Education Provided Provided Therapist, sports, Provided Web-based Education, Provided Education  Provided Verbal Education On Nutrition, Foot Care, Eye Care, Labs, Blood Sugar Monitoring, Mental Health/Coping with Illness, Applications, Exercise, Medication, When to see the doctor, [employer], Human resources officer, Personal Care Services  Mental Health Interventions   Mental Health Discussed/Reviewed Mental Health Discussed, Mental Health Reviewed, Coping Strategies, Crisis, Anxiety, Depression, Grief and Loss, Substance Abuse, Suicide  Nutrition Interventions   Nutrition Discussed/Reviewed Nutrition Discussed, Nutrition Reviewed, Adding fruits and vegetables, Fluid intake, Decreasing sugar intake, Increaing proteins, Decreasing fats, Decreasing salt  Pharmacy Interventions   Pharmacy Dicussed/Reviewed Pharmacy Topics Discussed, Pharmacy Topics Reviewed, Medication Adherence, Affording Medications, Referral to Pharmacist  Medication Adherence Unable to refill medication  Referral to Pharmacist Cannot afford medications  Safety Interventions   Safety Discussed/Reviewed Safety Discussed, Safety Reviewed, Fall Risk, Home Safety  Home Safety Assistive Devices, Need for home safety assessment, Refer for home visit, Contact provider for referral to PT/OT, Refer for community resources, Contact home health agency  Advanced Directive Interventions   Advanced Directives Discussed/Reviewed Advanced Directives Discussed, Advanced Directives  Reviewed, Advanced Care Planning, Provided resource for acquiring and filling out documents      Active Listening & Reflection Utilized.  Emotional Support Provided. Verbalization of Feelings Encouraged.  Feelings of Frustration & Setbacks Acknowledged. Symptoms of Surgical Pain Validated. Solution-Focused Strategies Revised. Problem Solving Interventions Implemented. Task-Centered Solutions Employed.  Cognitive Behavioral Therapy Performed.  Client-Centered Therapy Performed Initiated. Acceptance &  Commitment Therapy Employed. Confirmed [facility]ization on 10/07/2022, to Undergo Left Below Knee Amputation. Confirmed Interest & Request to Return to [facility] [phone]), Pending Insurance Approval & Authorization, to Continue Physical & Occupational Therapies, Upon Medical Clearance & Discharge from [facility] (# [phone]). Awaiting Return Call from Daughter, [name] to Check Status of Application for Aid & Attendance Benefits, through [facility] [phone]).  Encouraged Careers information officer with CSW (# [phone]), if You Have Questions, Need Assistance, or If Additional Social Work Needs Are Identified Between Now & Our Next Scheduled Follow-Up Outreach Call.      SDOH assessments and interventions completed:  Yes.  Care Coordination Interventions:  Yes, provided.   Follow up plan: Follow up call scheduled for 11/09/2022 at 12:45 pm.  Encounter Outcome:  Pt. Visit Completed.   Danford Bad, BSW, MSW, LCSW  Licensed Restaurant manager, [employer] [facility]  Mailing [place] [address] [place], [place] [address] Physical Address-[address], [place], [place] [address] Toll Free Main # [phone] Fax # [phone] Cell # [phone] [email or web address]

## 2022-10-12 NOTE — Progress Notes (Signed)
   10/12/22 1229  Vitals  Temp 97.8 F (36.6 C)  Pulse Rate 75  Resp 13  BP 122/80  SpO2 97 %  O2 Device Room Air  Weight 75 kg  Type of Weight Post-Dialysis  Oxygen Therapy  Patient Activity (if Appropriate) In bed  Pulse Oximetry Type Continuous  Oximetry Probe Site Changed No  Post Treatment  Dialyzer Clearance Lightly streaked  Duration of HD Treatment -hour(s) 3.5 hour(s)  Hemodialysis Intake (mL) 0 mL  Liters Processed 84  Fluid Removed (mL) 3000 mL  Tolerated HD Treatment Yes  AVG/AVF Arterial Site Held (minutes) 10 minutes  AVG/AVF Venous Site Held (minutes) 10 minutes   Received patient in bed to unit.  Alert and oriented.  Informed consent signed and in chart.   TX duration:3.5  Patient tolerated well.  Transported back to the room  Alert, without acute distress.  Hand-off given to patient's nurse.   Access used: LUAF Access issues: No complications  Total UF removed: 3000 Medication(s) given: none    [facility]

## 2022-10-12 NOTE — Plan of Care (Signed)
  Problem: Education: Goal: Ability to describe self-care measures that may prevent or decrease complications (Diabetes Survival Skills Education) will improve Outcome: Progressing Goal: Individualized Educational Video(s) Outcome: Progressing   Problem: Coping: Goal: Ability to adjust to condition or change in health will improve Outcome: Progressing   Problem: Fluid Volume: Goal: Ability to maintain a balanced intake and output will improve Outcome: Progressing   Problem: Health Behavior/Discharge Planning: Goal: Ability to identify and utilize available resources and services will improve Outcome: Progressing Goal: Ability to manage health-related needs will improve Outcome: Progressing   Problem: Metabolic: Goal: Ability to maintain appropriate glucose levels will improve Outcome: Progressing   Problem: Nutritional: Goal: Maintenance of adequate nutrition will improve Outcome: Progressing Goal: Progress toward achieving an optimal weight will improve Outcome: Progressing   Problem: Skin Integrity: Goal: Risk for impaired skin integrity will decrease Outcome: Progressing   Problem: Tissue Perfusion: Goal: Adequacy of tissue perfusion will improve Outcome: Progressing   Problem: Education: Goal: Knowledge of the prescribed therapeutic regimen will improve Outcome: Progressing Goal: Ability to verbalize activity precautions or restrictions will improve Outcome: Progressing Goal: Understanding of discharge needs will improve Outcome: Progressing   Problem: Activity: Goal: Ability to perform//tolerate increased activity and mobilize with assistive devices will improve Outcome: Progressing   Problem: Clinical Measurements: Goal: Postoperative complications will be avoided or minimized Outcome: Progressing   Problem: Self-Care: Goal: Ability to meet self-care needs will improve Outcome: Progressing   Problem: Self-Concept: Goal: Ability to maintain and perform  role responsibilities to the fullest extent possible will improve Outcome: Progressing   Problem: Pain Management: Goal: Pain level will decrease with appropriate interventions Outcome: Progressing   Problem: Skin Integrity: Goal: Demonstration of wound healing without infection will improve Outcome: Progressing   Problem: Education: Goal: Knowledge of General Education information will improve Description: Including pain rating scale, medication(s)/side effects and non-pharmacologic comfort measures Outcome: Progressing   Problem: Health Behavior/Discharge Planning: Goal: Ability to manage health-related needs will improve Outcome: Progressing   Problem: Clinical Measurements: Goal: Ability to maintain clinical measurements within normal limits will improve Outcome: Progressing Goal: Will remain free from infection Outcome: Progressing Goal: Diagnostic test results will improve Outcome: Progressing Goal: Respiratory complications will improve Outcome: Progressing Goal: Cardiovascular complication will be avoided Outcome: Progressing   Problem: Activity: Goal: Risk for activity intolerance will decrease Outcome: Progressing   Problem: Nutrition: Goal: Adequate nutrition will be maintained Outcome: Progressing   Problem: Coping: Goal: Level of anxiety will decrease Outcome: Progressing   Problem: Elimination: Goal: Will not experience complications related to bowel motility Outcome: Progressing Goal: Will not experience complications related to urinary retention Outcome: Progressing   Problem: Pain Managment: Goal: General experience of comfort will improve Outcome: Progressing   Problem: Safety: Goal: Ability to remain free from injury will improve Outcome: Progressing   Problem: Skin Integrity: Goal: Risk for impaired skin integrity will decrease Outcome: Progressing   

## 2022-10-12 NOTE — Progress Notes (Signed)
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 PMH includes: R BKA, anemia, arthritis, CHF, ESRD on HD MWF, HTN, memory loss, obesity, OSA, stroke, and DM II.    PT Comments  Patient resting in bed at start of session and denies fatigue following HD. Pt completed supine>sit EOB with Min assist and cues to use bed rail. Balance steady at EOB with bil UE support, no posterior lean noted. With initiation of mobility pt develops bil UE tremors during lateral scoot transfers. Mod +2 provided with bed pad and verbal cues for head:hip relationship to scoot along EOB. Bed height slightly elevated to increase ease of transfer bed>chair and pt able to scoot fully to recliner. Pt performed bil LE exercises and pressure relief exercises in recliner. Encouraged to remain OOB for several hours. Will continue to progress as able.    Assistance Recommended at Discharge Frequent or constant Supervision/Assistance  If plan is discharge home, recommend the following:  Can travel by private vehicle    Two people to help with walking and/or transfers;Two people to help with bathing/dressing/bathroom;Direct supervision/assist for medications management;Direct supervision/assist for financial management;Assist for transportation;Help with stairs or ramp for entrance;Assistance with cooking/housework   No  Equipment Recommendations   (defer to next venue)    Recommendations for Other Services Rehab consult     Precautions / Restrictions Precautions Precautions: Fall Precaution Comments: bilateral BKA Restrictions Weight Bearing Restrictions: Yes LLE Weight Bearing: Non weight bearing Other Position/Activity Restrictions: limb protector     Mobility  Bed Mobility Overal bed mobility: Needs Assistance Bed Mobility: Supine to  Sit     Supine to sit: Min assist, HOB elevated     General bed mobility comments: min assist with cues to walk LE's off EOB and use bed rail to raise trunk upright. cues to use UE's to scoot ant to EOB, min assist with bed pad.    Transfers Overall transfer level: Needs assistance Equipment used: None Transfers: Bed to chair/wheelchair/BSC            Lateral/Scoot Transfers: Mod assist, +2 safety/equipment General transfer comment: Cues for head:hip relationship with lateral scooting for transfer bed>chair. EOB elevated slightly slight decline transfer. Mod +2 with use of bed pad to scoot laterally.    Ambulation/Gait                   Stairs             Wheelchair Mobility     Tilt Bed    Modified Rankin (Stroke Patients Only)       Balance Overall balance assessment: Needs assistance Sitting-balance support: Feet supported Sitting balance-Leahy Scale: Fair Sitting balance - Comments: supervision for static at EOB                                    Cognition Arousal/Alertness: Awake/alert Behavior During Therapy: WFL for tasks assessed/performed Overall Cognitive Status: No family/caregiver present to determine baseline cognitive functioning                       Memory: Decreased recall of precautions, Decreased short-term memory Following Commands: Follows one step commands consistently, Follows one step commands with increased time   Awareness: Emergent Problem  Solving: Requires verbal cues, Difficulty sequencing, Slow processing          Exercises Other Exercises Other Exercises: 10 reps triceps press ups in recliner, bil UE with min guard for safety Other Exercises: 10 reps bil LE hip Add/Abd with isoemtric hold Other Exercises: 10 reps bil LE quad set    General Comments        Pertinent Vitals/Pain Pain Assessment Pain Assessment: Faces Faces Pain Scale: Hurts little more Pain Location: Lt LE Pain  Descriptors / Indicators: Aching, Discomfort Pain Intervention(s): Limited activity within patient's tolerance, Monitored during session, Repositioned    Home Living                          Prior Function            PT Goals (current goals can now be found in the care plan section) Acute Rehab PT Goals Patient Stated Goal: return home PT Goal Formulation: With patient/family Time For Goal Achievement: 10/22/22 Potential to Achieve Goals: Fair Progress towards PT goals: Progressing toward goals    Frequency    Min 3X/week      PT Plan Current plan remains appropriate    Co-evaluation              AM-PAC PT "6 Clicks" Mobility   Outcome Measure  Help needed turning from your back to your side while in a flat bed without using bedrails?: A Little Help needed moving from lying on your back to sitting on the side of a flat bed without using bedrails?: A Little Help needed moving to and from a bed to a chair (including a wheelchair)?: A Lot Help needed standing up from a chair using your arms (e.g., wheelchair or bedside chair)?: Total Help needed to walk in hospital room?: Total Help needed climbing 3-5 steps with a railing? : Total 6 Click Score: 11    End of Session   Activity Tolerance: Patient tolerated treatment well Patient left: in chair;with call bell/phone within reach;with chair alarm set Nurse Communication: Mobility status PT Visit Diagnosis: Other abnormalities of gait and mobility (R26.89);Muscle weakness (generalized) (M62.81)     Time: 0981-1914 PT Time Calculation (min) (ACUTE ONLY): 26 min  Charges:    $Therapeutic Exercise: 8-22 mins PT General Charges $$ ACUTE PT VISIT: 1 Visit                     Wynn Maudlin, DPT Acute Rehabilitation Services Office [phone]  10/12/22 4:11 PM

## 2022-10-12 NOTE — Plan of Care (Signed)
  Problem: Education: Goal: Ability to describe self-care measures that may prevent or decrease complications (Diabetes Survival Skills Education) will improve Outcome: Progressing   Problem: Education: Goal: Individualized Educational Video(s) Outcome: Progressing   Problem: Coping: Goal: Ability to adjust to condition or change in health will improve Outcome: Progressing   Problem: Fluid Volume: Goal: Ability to maintain a balanced intake and output will improve Outcome: Progressing   Problem: Health Behavior/Discharge Planning: Goal: Ability to manage health-related needs will improve Outcome: Progressing   Problem: Metabolic: Goal: Ability to maintain appropriate glucose levels will improve Outcome: Progressing   Problem: Skin Integrity: Goal: Risk for impaired skin integrity will decrease Outcome: Progressing

## 2022-10-12 NOTE — Progress Notes (Signed)
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 Lajoyce Corners. Awaiting rehab bed.  2. ESRD - on HD MWF.  Tolerating well, continue regular schedule  3. Anemia of CKD- Hgb 8.0<8.2- declined post op. Ordered aranesp start 7/22 4. Secondary hyperparathyroidism - Calcium controlled, phos 5.9 Continue VDRA, sensipar and binders.  5. HTN/volume - Blood pressure variable, elevated today.  Continue home meds.Will need new edw on discharge. 6. Nutrition - Renal diet with fluid restrictions.    Kathryn Pastel, PA-C [facility] Beeper [phone] 10/12/2022,11:22 AM  LOS: 5 days   Labs: Basic Metabolic Panel: Recent Labs  Lab 10/07/22 1211 10/09/22 1035 10/12/22 0658  NA 139 138 138  K 5.3* 4.6 4.9  CL 107 95* 102  CO2  --  24 25  GLUCOSE 217* 166* 171*  BUN 85* 64* 96*  CREATININE 9.80* 8.42* 10.08*  CALCIUM  --  9.0 9.1  PHOS  --  5.7* 5.9*   Liver Function Tests: Recent Labs  Lab 10/09/22 1035 10/12/22 0658  ALBUMIN 3.0* 2.7*   No results for input(s): "LIPASE",  "AMYLASE" in the last 168 hours. No results for input(s): "AMMONIA" in the last 168 hours. CBC: Recent Labs  Lab 10/09/22 1410 10/10/22 0231 10/11/22 0220 10/12/22 0408  WBC 7.8 7.2 6.5 6.6  HGB 8.4* 8.4* 8.2* 8.0*  HCT 27.3* 27.7* 27.3* 26.5*  MCV 82.5 80.8 81.3 80.3  PLT 198 202 209 241   Cardiac Enzymes: No results for input(s): "CKTOTAL", "CKMB", "CKMBINDEX", "TROPONINI" in the last 168 hours. CBG: Recent Labs  Lab 10/11/22 0756 10/11/22 1137 10/11/22 1548 10/11/22 2143 10/12/22 0800  GLUCAP 312* 180* 159* 231* 165*    Studies/Results: No results found. Medications:  anticoagulant sodium citrate     magnesium sulfate bolus IVPB      vitamin C  1,000 mg Oral Daily   aspirin  162 mg Oral Daily   calcitRIOL  3 mcg Oral Q M,W,F-HD   Chlorhexidine Gluconate Cloth  6 each Topical Q0600   cinacalcet  90 mg Oral Q M,W,F-HD   darbepoetin (ARANESP) injection - DIALYSIS  100 mcg Subcutaneous Q Mon-1800   docusate sodium  100 mg Oral Daily   DULoxetine  60 mg Oral Daily   ezetimibe  10 mg Oral Daily  insulin aspart  0-9 Units Subcutaneous TID WC   insulin aspart  3 Units Subcutaneous TID WC   insulin glargine-yfgn  20 Units Subcutaneous QHS   ketotifen  1 drop Both Eyes BID   metoprolol succinate  50 mg Oral Once per day on Sunday Tuesday Thursday Saturday   nutrition supplement (JUVEN)  1 packet Oral BID BM   pantoprazole  40 mg Oral Daily   pregabalin  50 mg Oral TID   sertraline  50 mg Oral Daily   sucroferric oxyhydroxide  1,000 mg Oral TID WC   zinc sulfate  220 mg Oral Daily

## 2022-10-12 NOTE — Patient Instructions (Signed)
Visit Information  Thank you for taking time to visit with me today. Please don't hesitate to contact me if I can be of assistance to you.   Following are the goals we discussed today:   Goals Addressed             This Visit's Progress    Receive Assistance Applying for Aid & Attendance Benefits, through [employer].   On track    Care Coordination Interventions:  Interventions Today    Flowsheet Row Most Recent Value  Chronic Disease   Chronic disease during today's visit Diabetes, Hypertension (HTN), Chronic Kidney Disease/End Stage Renal Disease (ESRD), Congestive Heart Failure (CHF), Other  [Neuropathy, Chronic Pain Syndrome, Anxiety, Depression, Memory Loss, Neurocognitive Disorder & Requires Maximum Assistance with Activities of Daily Living]  General Interventions   General Interventions Discussed/Reviewed General Interventions Reviewed, Annual Eye Exam, General Interventions Discussed, Labs, Annual Foot Exam, Lipid Profile, Durable Medical Equipment (DME), Vaccines, Health Screening, [employer], Doctor Visits, Communication with, Level of Care  [Primary Care Provider]  Labs Hgb A1c every 3 months, Kidney Function  Vaccines COVID-19, Flu, Pneumonia, RSV, Shingles, Tetanus/Pertussis/Diphtheria  [Encouraged]  Doctor Visits Discussed/Reviewed Doctor Visits Discussed, Doctor Visits Reviewed, Annual Wellness Visits, PCP, Specialist  [Encouraged]  Health Screening Colonoscopy, Mammogram  [Encouraged]  Durable Medical Equipment (DME) BP Cuff, Glucomoter, Environmental consultant, Wheelchair, Physicist, medical  PCP/Specialist Visits Compliance with follow-up visit  Communication with PCP/Specialists, RN  Level of Care Adult Daycare, Air traffic controller, Assisted Living, Skilled Nursing Facility, Teaching laboratory technician Medicaid, Personal Care Services  Exercise Interventions   Exercise Discussed/Reviewed Exercise Discussed, Exercise Reviewed, Physical  Activity, Assistive device use and maintanence  Physical Activity Discussed/Reviewed Physical Activity Discussed, Physical Activity Reviewed, Types of exercise, Home Exercise Program (HEP)  [Encouraged]  Education Interventions   Education Provided Provided Therapist, sports, Provided Web-based Education, Provided Education  Provided Verbal Education On Nutrition, Foot Care, Eye Care, Labs, Blood Sugar Monitoring, Mental Health/Coping with Illness, Applications, Exercise, Medication, When to see the doctor, [employer], Human resources officer, Personal Care Services  Mental Health Interventions   Mental Health Discussed/Reviewed Mental Health Discussed, Mental Health Reviewed, Coping Strategies, Crisis, Anxiety, Depression, Grief and Loss, Substance Abuse, Suicide  Nutrition Interventions   Nutrition Discussed/Reviewed Nutrition Discussed, Nutrition Reviewed, Adding fruits and vegetables, Fluid intake, Decreasing sugar intake, Increaing proteins, Decreasing fats, Decreasing salt  Pharmacy Interventions   Pharmacy Dicussed/Reviewed Pharmacy Topics Discussed, Pharmacy Topics Reviewed, Medication Adherence, Affording Medications, Referral to Pharmacist  Medication Adherence Unable to refill medication  Referral to Pharmacist Cannot afford medications  Safety Interventions   Safety Discussed/Reviewed Safety Discussed, Safety Reviewed, Fall Risk, Home Safety  Home Safety Assistive Devices, Need for home safety assessment, Refer for home visit, Contact provider for referral to PT/OT, Refer for community resources, Contact home health agency  Advanced Directive Interventions   Advanced Directives Discussed/Reviewed Advanced Directives Discussed, Advanced Directives Reviewed, Advanced Care Planning, Provided resource for acquiring and filling out documents      Active Listening & Reflection Utilized.  Emotional Support Provided. Verbalization of Feelings Encouraged.  Feelings  of Frustration & Setbacks Acknowledged. Symptoms of Surgical Pain Validated. Solution-Focused Strategies Revised. Problem Solving Interventions Implemented. Task-Centered Solutions Employed.  Cognitive Behavioral Therapy Performed.  Client-Centered Therapy Performed Initiated. Acceptance & Commitment Therapy Employed. Confirmed [facility] Hospitalization on 10/07/2022, to Undergo Left Below Knee Amputation. Confirmed Interest & Request to Return to [facility] [phone]), Pending Insurance Approval &  Authorization, to Continue Physical & Occupational Therapies, Upon Medical Clearance & Discharge from [facility] (# [phone]). Awaiting Return Call from Daughter, [name] to Check Status of Application for Aid & Attendance Benefits, through [facility] [phone]).  Encouraged Careers information officer with CSW (# [phone]), if You Have Questions, Need Assistance, or If Additional Social Work Needs Are Identified Between Now & Our Next Scheduled Follow-Up Outreach Call.      Our next appointment is by telephone on 11/09/2022 at 12:45 pm.  Please call the care guide team at [phone] if you need to cancel or reschedule your appointment.   If you are experiencing a Mental Health or Behavioral Health Crisis or need someone to talk to, please call the Suicide and Crisis Lifeline: 988 call the [place] National Suicide Prevention Lifeline: 814-220-4142 or TTY: (248) 130-1119 TTY (970)050-5628) to talk to a trained counselor call 1-800-273-TALK (toll free, 24 hour hotline) go to [facility] [address], [place] [phone]) call the [facility] Crisis Line: [phone] call 911  Patient verbalizes understanding of instructions and care plan provided today and agrees to view in MyChart. Active MyChart status and patient understanding of how to access instructions  and care plan via MyChart confirmed with patient.     Telephone follow up appointment with care management team member scheduled for:  11/09/2022 at 12:45 pm.  Danford Bad, BSW, MSW, LCSW  Licensed Clinical Social Worker  [facility]  Mailing [place]. [address], [place], [place] [address] Physical Address-[address] [place], [place] [address] Toll Free Main # [phone] Fax # [phone] Cell # [phone] [email or web address]

## 2022-10-12 NOTE — Progress Notes (Signed)
OT Cancellation Note  Patient Details Name: Kathryn Beck MRN: [id number] DOB: [birth year]   Cancelled Treatment:    Reason Eval/Treat Not Completed: Patient at procedure or test/ unavailable. Pt. Is off of the unit at HD.  Will check back as sch. Allows along with pt. Availability/tolerance for participation.     Alessandra Bevels Lorraine-COTA/L 10/12/2022, 9:45 AM

## 2022-10-12 NOTE — Progress Notes (Signed)
Patient ID: Kathryn Beck, female   DOB: 1955/04/30, 67 y.o.   MRN: [id number] Patient without complaints this morning.  No drainage in the wound VAC canister with a good suction fit.  Awaiting approval for inpatient versus outpatient rehab.

## 2022-10-12 NOTE — Progress Notes (Signed)
Occupational Therapy Treatment Patient Details Name: Kathryn Beck MRN: [id number] DOB: Dec 19, 1955 Today's Date: 10/12/2022   History of present illness The pt is a 67 yo female presenting 7/17 for L BKA due to non-healing wound. PMH includes: R BKA, anemia, arthritis, CHF, ESRD on HD MWF, HTN, memory loss, obesity, OSA, stroke, and DM II.   OT comments  Pt progressing well towards goals this session, needing mod A +2 for lateral scoot transfer to chair with use of bed pad. Pt min A for bed mobility and able to perform seated UB therex while seated in chair. Pt educated on limb desensitization strategies and pt verbalized understanding. Pt presenting with impairments listed below, will follow acutely. Patient will benefit from intensive inpatient follow up therapy, >3 hours/day to maximize safety/ind with ADLs/functional mobility.    Recommendations for follow up therapy are one component of a multi-disciplinary discharge planning process, led by the attending physician.  Recommendations may be updated based on patient status, additional functional criteria and insurance authorization.    Assistance Recommended at Discharge Frequent or constant Supervision/Assistance  Patient can return home with the following  A lot of help with walking and/or transfers;A lot of help with bathing/dressing/bathroom;Assist for transportation;Help with stairs or ramp for entrance;Two people to help with walking and/or transfers;Assistance with cooking/housework   Equipment Recommendations  Other (comment) (defer)    Recommendations for Other Services PT consult    Precautions / Restrictions Precautions Precautions: Fall Precaution Comments: bilateral BKA Restrictions Weight Bearing Restrictions: Yes LLE Weight Bearing: Non weight bearing Other Position/Activity Restrictions: limb protector       Mobility Bed Mobility Overal bed mobility: Needs Assistance Bed Mobility: Supine to Sit     Supine  to sit: Min assist, HOB elevated          Transfers Overall transfer level: Needs assistance Equipment used: None Transfers: Bed to chair/wheelchair/BSC            Lateral/Scoot Transfers: Mod assist, +2 safety/equipment       Balance Overall balance assessment: Needs assistance Sitting-balance support: Feet supported Sitting balance-Leahy Scale: Fair Sitting balance - Comments: supervision for static at EOB                                   ADL either performed or assessed with clinical judgement   ADL Overall ADL's : Needs assistance/impaired                         Toilet Transfer: Moderate assistance;+2 for physical assistance           Functional mobility during ADLs: Moderate assistance;+2 for physical assistance      Extremity/Trunk Assessment Upper Extremity Assessment Upper Extremity Assessment: Generalized weakness   Lower Extremity Assessment Lower Extremity Assessment: Defer to PT evaluation        Vision   Additional Comments: appearinig WFL for basic ADL/mobility tasks   Perception Perception Perception: Not tested   Praxis Praxis Praxis: Not tested    Cognition Arousal/Alertness: Awake/alert Behavior During Therapy: WFL for tasks assessed/performed Overall Cognitive Status: No family/caregiver present to determine baseline cognitive functioning                       Memory: Decreased recall of precautions, Decreased short-term memory Following Commands: Follows one step commands consistently, Follows one step commands with increased time   Awareness:  Emergent Problem Solving: Requires verbal cues, Difficulty sequencing, Slow processing          Exercises Exercises: Other exercises Other Exercises Other Exercises: seated overhead press with green theraband x10 Other Exercises: seated forward punches x10 green theraband    Shoulder Instructions       General Comments VSS on RA     Pertinent Vitals/ Pain       Pain Assessment Pain Assessment: Faces Pain Score: 4  Faces Pain Scale: Hurts little more Pain Location: Lt LE Pain Descriptors / Indicators: Aching, Discomfort Pain Intervention(s): Limited activity within patient's tolerance, Monitored during session  Home Living                                          Prior Functioning/Environment              Frequency  Min 1X/week        Progress Toward Goals  OT Goals(current goals can now be found in the care plan section)  Progress towards OT goals: Progressing toward goals  Acute Rehab OT Goals OT Goal Formulation: Patient unable to participate in goal setting Time For Goal Achievement: 10/29/22 Potential to Achieve Goals: Good ADL Goals Pt Will Perform Grooming: with set-up;with supervision;sitting Pt Will Perform Lower Body Bathing: with set-up;bed level;with modified independence Pt Will Perform Lower Body Dressing: bed level;with modified independence Pt Will Transfer to Toilet: anterior/posterior transfer;with min guard assist  Plan Discharge plan remains appropriate;Frequency remains appropriate    Co-evaluation    PT/OT/SLP Co-Evaluation/Treatment: Yes Reason for Co-Treatment: Necessary to address cognition/behavior during functional activity;For patient/therapist safety;To address functional/ADL transfers   OT goals addressed during session: ADL's and self-care      AM-PAC OT "6 Clicks" Daily Activity     Outcome Measure   Help from another person eating meals?: None Help from another person taking care of personal grooming?: A Little Help from another person toileting, which includes using toliet, bedpan, or urinal?: A Lot Help from another person bathing (including washing, rinsing, drying)?: A Lot Help from another person to put on and taking off regular upper body clothing?: A Little Help from another person to put on and taking off regular lower body  clothing?: A Lot 6 Click Score: 16    End of Session    OT Visit Diagnosis: Unsteadiness on feet (R26.81);Other abnormalities of gait and mobility (R26.89)   Activity Tolerance Patient tolerated treatment well   Patient Left with call bell/phone within reach;in chair;with chair alarm set   Nurse Communication Mobility status        Time: 9528-4132 OT Time Calculation (min): 23 min  Charges: OT General Charges $OT Visit: 1 Visit OT Treatments $Therapeutic Activity: 8-22 mins  Carver Fila, OTD, OTR/L SecureChat Preferred Acute Rehab [phone]   Dalphine Handing 10/12/2022, 4:35 PM

## 2022-10-13 ENCOUNTER — Inpatient Hospital Stay: Payer: Self-pay | Admitting: Internal Medicine

## 2022-10-13 ENCOUNTER — Encounter: Payer: Self-pay | Admitting: Family Medicine

## 2022-10-13 ENCOUNTER — Inpatient Hospital Stay: Payer: Medicare PPO | Admitting: Internal Medicine

## 2022-10-13 LAB — GLUCOSE, CAPILLARY
Glucose-Capillary: 163 mg/dL — ABNORMAL HIGH (ref 70–99)
Glucose-Capillary: 268 mg/dL — ABNORMAL HIGH (ref 70–99)
Glucose-Capillary: 88 mg/dL (ref 70–99)
Glucose-Capillary: 193 mg/dL — ABNORMAL HIGH (ref 70–99)

## 2022-10-13 LAB — CBC
HCT: 31.1 % — ABNORMAL LOW (ref 36.0–46.0)
Hemoglobin: 9.4 g/dL — ABNORMAL LOW (ref 12.0–15.0)
MCH: 24.5 pg — ABNORMAL LOW (ref 26.0–34.0)
MCHC: 30.2 g/dL (ref 30.0–36.0)
MCV: 81.2 fL (ref 80.0–100.0)
Platelets: 266 10*3/uL (ref 150–400)
RBC: 3.83 MIL/uL — ABNORMAL LOW (ref 3.87–5.11)
RDW: 19.6 % — ABNORMAL HIGH (ref 11.5–15.5)
WBC: 7.6 10*3/uL (ref 4.0–10.5)
nRBC: 0 % (ref 0.0–0.2)

## 2022-10-13 MED ORDER — OXYCODONE-ACETAMINOPHEN 5-325 MG PO TABS: 1.0000 | ORAL_TABLET | ORAL | 0 refills | Status: DC | PRN

## 2022-10-13 NOTE — Care Management Important Message (Signed)
Important Message  Patient Details  Name: Kathryn Beck MRN: [id number] Date of Birth: [birth year]   Medicare Important Message Given:  Yes     Sherilyn Banker 10/13/2022, 11:52 AM

## 2022-10-13 NOTE — Plan of Care (Signed)
  Problem: Education: Goal: Ability to describe self-care measures that may prevent or decrease complications (Diabetes Survival Skills Education) will improve Outcome: Progressing Goal: Individualized Educational Video(s) Outcome: Progressing   Problem: Coping: Goal: Ability to adjust to condition or change in health will improve Outcome: Progressing   Problem: Fluid Volume: Goal: Ability to maintain a balanced intake and output will improve Outcome: Progressing   Problem: Health Behavior/Discharge Planning: Goal: Ability to identify and utilize available resources and services will improve Outcome: Progressing Goal: Ability to manage health-related needs will improve Outcome: Progressing   Problem: Metabolic: Goal: Ability to maintain appropriate glucose levels will improve Outcome: Progressing   Problem: Nutritional: Goal: Maintenance of adequate nutrition will improve Outcome: Progressing Goal: Progress toward achieving an optimal weight will improve Outcome: Progressing   Problem: Skin Integrity: Goal: Risk for impaired skin integrity will decrease Outcome: Progressing   Problem: Tissue Perfusion: Goal: Adequacy of tissue perfusion will improve Outcome: Progressing   Problem: Education: Goal: Knowledge of the prescribed therapeutic regimen will improve Outcome: Progressing Goal: Ability to verbalize activity precautions or restrictions will improve Outcome: Progressing Goal: Understanding of discharge needs will improve Outcome: Progressing   Problem: Activity: Goal: Ability to perform//tolerate increased activity and mobilize with assistive devices will improve Outcome: Progressing   Problem: Clinical Measurements: Goal: Postoperative complications will be avoided or minimized Outcome: Progressing   Problem: Self-Care: Goal: Ability to meet self-care needs will improve Outcome: Progressing   Problem: Self-Concept: Goal: Ability to maintain and perform  role responsibilities to the fullest extent possible will improve Outcome: Progressing   Problem: Pain Management: Goal: Pain level will decrease with appropriate interventions Outcome: Progressing   Problem: Skin Integrity: Goal: Demonstration of wound healing without infection will improve Outcome: Progressing   Problem: Education: Goal: Knowledge of General Education information will improve Description: Including pain rating scale, medication(s)/side effects and non-pharmacologic comfort measures Outcome: Progressing   Problem: Health Behavior/Discharge Planning: Goal: Ability to manage health-related needs will improve Outcome: Progressing   Problem: Clinical Measurements: Goal: Ability to maintain clinical measurements within normal limits will improve Outcome: Progressing Goal: Will remain free from infection Outcome: Progressing Goal: Diagnostic test results will improve Outcome: Progressing Goal: Respiratory complications will improve Outcome: Progressing Goal: Cardiovascular complication will be avoided Outcome: Progressing   Problem: Activity: Goal: Risk for activity intolerance will decrease Outcome: Progressing   Problem: Nutrition: Goal: Adequate nutrition will be maintained Outcome: Progressing   Problem: Coping: Goal: Level of anxiety will decrease Outcome: Progressing   Problem: Elimination: Goal: Will not experience complications related to bowel motility Outcome: Progressing Goal: Will not experience complications related to urinary retention Outcome: Progressing   Problem: Pain Managment: Goal: General experience of comfort will improve Outcome: Progressing   Problem: Safety: Goal: Ability to remain free from injury will improve Outcome: Progressing   Problem: Skin Integrity: Goal: Risk for impaired skin integrity will decrease Outcome: Progressing   

## 2022-10-13 NOTE — Progress Notes (Signed)
Patient ID: Kathryn Beck, female   DOB: 1955/09/18, 67 y.o.   MRN: [id number] Patient is ready for discharge to skilled nursing facility.  Prescription on the chart.  I will place orders today to remove the wound VAC canister and complete discharge orders.

## 2022-10-13 NOTE — Consult Note (Signed)
   [facility] Inpatient Consult   10/13/2022  IA LEEB 09-17-1955 [id number]  [facility] [[facility]]  Accountable Care Organization [ACO] Patient: [facility] Medicare   Primary Care Provider: [name], MD   Patient screened for hospitalization with noted extreme high risk score for unplanned readmission risk 6 day length of stay and to assess for potential [facility]  [[facility]] Care Management service needs for post hospital transition for care coordination.  Review of patient's electronic medical record reveals patient is has been active with [facility] Social Worker per notes. However, on making rounds yesterday patient was in [facility] the floor].  Met with patient today at the bedside, patient is showing for transition to a SNF for rehab level of care.  Patient endorses PCP.  Patient states she wants to return home and spoke with patient regarding life at home and she states that prior to admission she and her husband could manage ADLs but noted she needed rehab from new surgical intervention and HD needs.  Patient gave permission to speak with daughter [name]. Review of [facility] Social Worker, Training and development officer, notes for Aid and Attendance benefits. Spoke with daughter via cell phone provided in EPIC, HIPAA verified.  Daughter states she has not completed the application for Aid and Attendance she was to send in one more paper to the [place].    Plan:  Will follow up with [facility] Social Worker regarding information for collaboration for community follow up needs. Secure chat sent to [facility] Social Worker, Mardene Celeste, to reach out to daughter next week for Aid and Attendance follow up needs for VA benefits. Referral request for community care coordination:  Patient needs are to be met at the SNF for rehab. Patient has a follow up appointment noted per [facility] SW on 11/09/22 as well per notes.  Will sign off at transition. Still awaiting auth from insurance noted per inpatient TOC notes.  Of note, [facility] Care  Management/Population Health does not replace or interfere with any arrangements made by the Inpatient Transition of Care team.  For questions contact:   Charlesetta Shanks, RN BSN CCM [facility] Liaison [phone] business mobile phone Toll free office [phone]  *Concierge Line  [phone] Fax number: [phone] [email or web address]     .

## 2022-10-13 NOTE — TOC Progression Note (Signed)
Transition of Care [facility]) - Progression Note    Patient Details  Name: Kathryn Beck MRN: [id number] Date of Birth: [birth year]  Transition of Care [facility]) CM/SW Contact  Erin Sons, [place] Phone Number: 10/13/2022, 10:38 AM  Clinical Narrative:     CSW confirmed with [facility] that they can admit pt once Berkley Harvey is approved. CSW initiated SNF auth in request in online portal. Status: pending.   CSW called and updated pt's daughter  Expected Discharge Plan: Skilled Nursing Facility Barriers to Discharge: Continued Medical Work up  Expected Discharge Plan and Services         Expected Discharge Date: 10/13/22                                     Social Determinants of Health (SDOH) Interventions SDOH Screenings   Food Insecurity: No Food Insecurity (10/07/2022)  Housing: Low Risk  (10/07/2022)  Transportation Needs: No Transportation Needs (10/07/2022)  Utilities: Not At Risk (10/07/2022)  Alcohol Screen: Low Risk  (05/12/2022)  Depression (PHQ2-9): Low Risk  (07/07/2022)  Recent Concern: Depression (PHQ2-9) - High Risk (05/12/2022)  Financial Resource Strain: Medium Risk (05/12/2022)  Physical Activity: Inactive (05/12/2022)  Social Connections: Socially Integrated (05/12/2022)  Stress: No Stress Concern Present (05/12/2022)  Tobacco Use: Low Risk  (10/12/2022)    Readmission Risk Interventions    05/01/2022    3:54 PM  Readmission Risk Prevention Plan  Transportation Screening Complete  HRI or Home Care Consult Complete  Social Work Consult for Recovery Care Planning/Counseling --  Palliative Care Screening Not Applicable  Medication Review Oceanographer) Complete

## 2022-10-13 NOTE — Discharge Summary (Signed)
Discharge Diagnoses:  Principal Problem:   S/P BKA (below knee amputation) unilateral, left (HCC) Active Problems:   Subacute osteomyelitis, left ankle and foot (HCC)   Dehiscence of amputation stump of left lower extremity (HCC)   Surgeries: Procedure(s): LEFT BELOW KNEE AMPUTATION on 10/07/2022    Consultants: Treatment Team:  Anthony Sar, MD  Discharged Condition: Improved  Hospital Course: Kathryn Beck is an 67 y.o. female who was admitted 10/07/2022 with a chief complaint of dehiscence left transmetatarsal amputation, with a final diagnosis of Dehiscence Left Transmetatarsal Amputation.  Patient was brought to the operating room on 10/07/2022 and underwent Procedure(s): LEFT BELOW KNEE AMPUTATION.    Patient was given perioperative antibiotics:  Anti-infectives (From admission, onward)    Start     Dose/Rate Route Frequency Ordered Stop   10/07/22 2200  ceFAZolin (ANCEF) IVPB 2g/100 mL premix  Status:  Discontinued        2 g 200 mL/hr over 30 Minutes Intravenous Every 8 hours 10/07/22 1652 10/07/22 1709   10/07/22 1145  ceFAZolin (ANCEF) IVPB 2g/100 mL premix        2 g 200 mL/hr over 30 Minutes Intravenous On call to O.R. 10/07/22 1138 10/07/22 1430     .  Patient was given sequential compression devices, early ambulation, and aspirin for DVT prophylaxis.  Recent vital signs: Patient Vitals for the past 24 hrs:  BP Temp Temp src Pulse Resp SpO2 Weight  10/13/22 0727 135/60 97.9 F (36.6 C) Oral 87 18 98 % --  10/13/22 0414 (!) 147/61 97.6 F (36.4 C) -- 74 18 100 % --  10/12/22 2116 127/61 97.8 F (36.6 C) -- 78 18 100 % --  10/12/22 1321 (!) 122/58 98.3 F (36.8 C) -- 74 19 97 % --  10/12/22 1229 122/80 97.8 F (36.6 C) -- 75 13 97 % 75 kg  10/12/22 1227 99/73 -- -- 77 13 98 % --  10/12/22 1200 (!) 125/43 -- -- 76 13 96 % --  10/12/22 1130 (!) 109/47 -- -- 80 13 92 % --  10/12/22 1100 (!) 111/56 -- -- 76 16 100 % --  10/12/22 1030 (!) 135/57 -- -- 75 13  98 % --  10/12/22 1000 135/66 -- -- 74 15 100 % --  .  Recent laboratory studies: No results found.  Discharge Medications:   Allergies as of 10/13/2022       Reactions   Ace Inhibitors Anaphylaxis, Swelling   Penicillins Itching, Swelling, Other (See Comments)   Did it involve swelling of the face/tongue/throat, SOB, or low BP? Unknown Did it involve sudden or severe rash/hives, skin peeling, or any reaction on the inside of your mouth or nose? Unknown Did you need to seek medical attention at a hospital or doctor's office? Unknown When did it last happen?      years  If all above answers are "NO", may proceed with cephalosporin use.   Statins Other (See Comments)   elevated LFT's   Albuterol Swelling        Medication List     TAKE these medications    aspirin 81 MG chewable tablet CHEW TWO TABLETS BY MOUTH EVERY DAY What changed: See the new instructions.   azelastine 0.05 % ophthalmic solution Commonly known as: OPTIVAR Place 1 drop into both eyes 2 (two) times daily. What changed:  when to take this reasons to take this   calcitRIOL 0.25 MCG capsule Commonly known as: ROCALTROL TAKE 1 CAPSULE (0.25 MCG  TOTAL) BY MOUTH 2 (TWO) TIMES DAILY WITH A MEAL.   cinacalcet 30 MG tablet Commonly known as: SENSIPAR Take 3 tablets (90 mg total) by mouth 3 (three) times a week.   ciprofloxacin 500 MG tablet Commonly known as: Cipro Take 1 tablet (500 mg total) by mouth at bedtime for 42 doses. Take after hemodialysis on HD days   doxycycline 100 MG capsule Commonly known as: VIBRAMYCIN Take 1 capsule (100 mg total) by mouth 2 (two) times daily.   DULoxetine 60 MG capsule Commonly known as: CYMBALTA TAKE (1) CAPSULE BY MOUTH ONCE DAILY. What changed: See the new instructions.   ezetimibe 10 MG tablet Commonly known as: ZETIA Take 1 tablet (10 mg total) by mouth daily.   Fiasp FlexTouch 100 UNIT/ML FlexTouch Pen Generic drug: insulin aspart Inject 10-20 Units  into the skin See admin instructions. Inj 10-20 three times daily via sliding scale   FreeStyle Libre 2 Reader Devi 1 each by Does not apply route daily.   FreeStyle Libre 2 Sensor Misc 1 each by Does not apply route every 14 (fourteen) days.   furosemide 80 MG tablet Commonly known as: LASIX Take 80 mg by mouth 2 (two) times daily.   hydrOXYzine 25 MG tablet Commonly known as: ATARAX Take 25 mg by mouth every 12 (twelve) hours as needed for itching.   insulin aspart 100 UNIT/ML injection Commonly known as: novoLOG Inject 1-10 Units into the skin 3 (three) times daily before meals. Sliding scale 101-150=1 units 151-200=2 units 201-250=4 units 251-300=6 units 301-350=8 units 351-450=10 units Above 450 call MD   Insulin Pen Needle 32G X 4 MM Misc Use 4x a day   metoprolol succinate 50 MG 24 hr tablet Commonly known as: TOPROL-XL TAKE (1) TABLET BY MOUTH DAILY WITH FOOD *TAKE AFTER DIALYSIS* What changed:  how much to take how to take this when to take this additional instructions   OneTouch Delica Lancets 33G Misc Use to check blood sugar 4X daily.   OneTouch Verio test strip Generic drug: glucose blood Use as instructed to check blood sugar 4X daily.   OneTouch Verio w/Device Kit Use to check blood sugar 4X daily.   oxyCODONE-acetaminophen 5-325 MG tablet Commonly known as: PERCOCET/ROXICET Take 1 tablet by mouth every 4 (four) hours as needed.   pregabalin 50 MG capsule Commonly known as: LYRICA TAKE ONE CAPSULE BY MOUTH three times a day   Rena-Vite Rx 1 MG Tabs Take 1 tablet by mouth daily.   sertraline 50 MG tablet Commonly known as: ZOLOFT TAKE ONE TABLET BY MOUTH EVERY DAY What changed: how much to take   torsemide 100 MG tablet Commonly known as: DEMADEX TAKE 1 TABLET BY MOUTH TWICE DAILY *HOLD MORNING DOSE ON DIALYSIS DAYS*   Toujeo Max SoloStar 300 UNIT/ML Solostar Pen Generic drug: insulin glargine (2 Unit Dial) Inject 30 Units into the  skin daily. May need to slowly increase the dose depending upon your blood sugar, follow-up with PCP   [name] 500 MG chewable tablet Generic drug: sucroferric oxyhydroxide Chew 500-1,000 mg by mouth See admin instructions. Take 1000mg  (2 tablets) by mouth with meals and 500mg  (1 tablet) with snacks        Diagnostic Studies: DG Foot 2 Views Left  Result Date: 10/01/2022 CLINICAL DATA:  Postoperative wound. EXAM: LEFT FOOT - 2 VIEW COMPARISON:  September 25, 2022. FINDINGS: Status post transmetatarsal amputation of the foot. Expected postoperative changes including surgical staples are seen in the stomach. Vascular calcifications are  noted. There does appear to be air within a wound within the soft tissues of the stump distally. No definite evidence of osteomyelitis. IMPRESSION: Status post transmetatarsal amputation of all 5 rays. There does appear to be and opened and air-filled wound in the distal stump, but no definite osteomyelitis is noted. Electronically Signed   By: Lupita Raider M.D.   On: 10/01/2022 15:35   DG Foot Complete Left  Result Date: 09/25/2022 CLINICAL DATA:  Wound infection. EXAM: LEFT FOOT - COMPLETE 3+ VIEW COMPARISON:  Radiograph 09/05/2022 FINDINGS: Previous transmetatarsal amputation of all 5 rays. Resection margins of the first, second and fifth metatarsals are indistinct. Overlying skin staples remain in place. Small amount of air is seen in the operative bed. No evidence of acute fracture. Extensive vascular calcifications. IMPRESSION: 1. Previous transmetatarsal amputation of all 5 rays. Resection margins of the first, second and fifth metatarsals are indistinct, suspicious for osteomyelitis. 2. Soft tissue gas in the operative bed, nonspecific, but not typically seen greater than 2 weeks out from surgery. Overlying skin staples remain in place. Electronically Signed   By: Narda Rutherford M.D.   On: 09/25/2022 17:09    Patient benefited maximally from their hospital stay  and there were no complications.     Disposition: Discharge disposition: 62-Rehab Facility      Discharge Instructions     Call MD / Call 911   Complete by: As directed    If you experience chest pain or shortness of breath, CALL 911 and be transported to the hospital emergency room.  If you develope a fever above 101 F, pus (white drainage) or increased drainage or redness at the wound, or calf pain, call your surgeon's office.   Call MD / Call 911   Complete by: As directed    If you experience chest pain or shortness of breath, CALL 911 and be transported to the hospital emergency room.  If you develope a fever above 101 F, pus (white drainage) or increased drainage or redness at the wound, or calf pain, call your surgeon's office.   Constipation Prevention   Complete by: As directed    Drink plenty of fluids.  Prune juice may be helpful.  You may use a stool softener, such as Colace (over the counter) 100 mg twice a day.  Use MiraLax (over the counter) for constipation as needed.   Constipation Prevention   Complete by: As directed    Drink plenty of fluids.  Prune juice may be helpful.  You may use a stool softener, such as Colace (over the counter) 100 mg twice a day.  Use MiraLax (over the counter) for constipation as needed.   Diet - low sodium heart healthy   Complete by: As directed    Diet - low sodium heart healthy   Complete by: As directed    Increase activity slowly as tolerated   Complete by: As directed    Increase activity slowly as tolerated   Complete by: As directed    Post-operative opioid taper instructions:   Complete by: As directed    POST-OPERATIVE OPIOID TAPER INSTRUCTIONS: It is important to wean off of your opioid medication as soon as possible. If you do not need pain medication after your surgery it is ok to stop day one. Opioids include: Codeine, Hydrocodone(Norco, Vicodin), Oxycodone(Percocet, oxycontin) and hydromorphone amongst others.  Long  term and even short term use of opiods can cause: Increased pain response Dependence Constipation Depression Respiratory depression And  more.  Withdrawal symptoms can include Flu like symptoms Nausea, vomiting And more Techniques to manage these symptoms Hydrate well Eat regular healthy meals Stay active Use relaxation techniques(deep breathing, meditating, yoga) Do Not substitute Alcohol to help with tapering If you have been on opioids for less than two weeks and do not have pain than it is ok to stop all together.  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 Maintain the same interval or time between taking each dose and first decrease the dose.  Cut the total daily intake of opioids by one tablet each day Next start to increase the time between doses. The last dose that should be eliminated is the evening dose.      Post-operative opioid taper instructions:   Complete by: As directed    POST-OPERATIVE OPIOID TAPER INSTRUCTIONS: It is important to wean off of your opioid medication as soon as possible. If you do not need pain medication after your surgery it is ok to stop day one. Opioids include: Codeine, Hydrocodone(Norco, Vicodin), Oxycodone(Percocet, oxycontin) and hydromorphone amongst others.  Long term and even short term use of opiods can cause: Increased pain response Dependence Constipation Depression Respiratory depression And more.  Withdrawal symptoms can include Flu like symptoms Nausea, vomiting And more Techniques to manage these symptoms Hydrate well Eat regular healthy meals Stay active Use relaxation techniques(deep breathing, meditating, yoga) Do Not substitute Alcohol to help with tapering If you have been on opioids for less than two weeks and do not have pain than it is ok to stop all together.  Plan to wean off of opioids This plan should start within one week post op of your joint  replacement. Maintain the same interval or time between taking each dose and first decrease the dose.  Cut the total daily intake of opioids by one tablet each day Next start to increase the time between doses. The last dose that should be eliminated is the evening dose.          Follow-up Information     [name], MD Follow up in 1 week(s).   Specialty: Orthopedic Surgery Contact information: [address] [place] [address] [phone]                  Signed: [name] 10/13/2022, 9:55 AM

## 2022-10-13 NOTE — TOC Progression Note (Signed)
Transition of Care [facility]) - Progression Note    Patient Details  Name: MYKELLE COCKERELL MRN: [id number] Date of Birth: 1955-09-01  Transition of Care [facility]) CM/SW Contact  Erin Sons, [place] Phone Number: 10/13/2022, 3:24 PM  Clinical Narrative:     [facility] Harvey not received prior to [facility] cutoff time. Will plan on DC tomorrow. Pt's daughter notified.   Expected Discharge Plan: Skilled Nursing Facility Barriers to Discharge: Continued Medical Work up  Expected Discharge Plan and Services         Expected Discharge Date: 10/13/22                                     Social Determinants of Health (SDOH) Interventions SDOH Screenings   Food Insecurity: No Food Insecurity (10/07/2022)  Housing: Low Risk  (10/07/2022)  Transportation Needs: No Transportation Needs (10/07/2022)  Utilities: Not At Risk (10/07/2022)  Alcohol Screen: Low Risk  (05/12/2022)  Depression (PHQ2-9): Low Risk  (07/07/2022)  Recent Concern: Depression (PHQ2-9) - High Risk (05/12/2022)  Financial Resource Strain: Medium Risk (05/12/2022)  Physical Activity: Inactive (05/12/2022)  Social Connections: Socially Integrated (05/12/2022)  Stress: No Stress Concern Present (05/12/2022)  Tobacco Use: Low Risk  (10/12/2022)    Readmission Risk Interventions    05/01/2022    3:54 PM  Readmission Risk Prevention Plan  Transportation Screening Complete  HRI or Home Care Consult Complete  Social Work Consult for Recovery Care Planning/Counseling --  Palliative Care Screening Not Applicable  Medication Review Oceanographer) Complete

## 2022-10-13 NOTE — Progress Notes (Addendum)
Subjective: Seen in room said tolerated dialysis last night =3 L UF.  Noted plans for SNF discharge today Will notify our outpatient center [facility] new dry weight ESA RX  Objective Vital signs in last 24 hours: Vitals:   10/12/22 1321 10/12/22 2116 10/13/22 0414 10/13/22 0727  BP: (!) 122/58 127/61 (!) 147/61 135/60  Pulse: 74 78 74 87  Resp: 19 18 18 18   Temp: 98.3 F (36.8 C) 97.8 F (36.6 C) 97.6 F (36.4 C) 97.9 F (36.6 C)  TempSrc:    Oral  SpO2: 97% 100% 100% 98%  Weight:      Height:       Weight change:   Physical Exam: General:  Alert adult female  NAd  Heart: RRR 1/6/sem , no rg  Lungs: CTA Non labored  Abdomen: Obese, NABS , NT, ND  Extremities: L BKA dressing dry clear, R BKA  no edema  Dialysis Access: L UA AVF + bruit    OP Dialysis Orders:  (per previous admit): MWF at [facility] 3:45hr, 400/500, EDW 80g, 2K/2.5Ca, UFP#2, LUE AVF 15g, heparin 1000 unit initial + 1000 unit/hr Meds: Mircera 60 mcg every 2 weeks (last dose 7/8), Venofer 50 mg once weekly, calcitriol 3 mcg every treatment, Sensipar 90 mg every treatment   Problem/Plan: 1. 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 Lajoyce Corners. Awaiting /snf /rehab bed.  2. ESRD - on HD MWF.  Tolerating well, continue regular schedule  3. Anemia of CKD- Hgb 9.4< 8.0<8.2- declined post op.  100 mcg aranesp  SQ start 7/22 4. Secondary hyperparathyroidism - Calcium controlled, phos 5.9 Continue VDRA, sensipar and binders.  5. HTN/volume - Blood pressure variable, stable today.  Near goal.  Continue home meds.Will need new edw on discharge. 77kg 6. Nutrition - Renal diet with fluid restrictions.   Lenny Pastel, PA-C [facility] Beeper [phone] 10/13/2022,1:05 PM  LOS: 6 days   Labs: Basic Metabolic Panel: Recent Labs  Lab 10/07/22 1211 10/09/22 1035 10/12/22 0658  NA 139 138 138  K 5.3* 4.6 4.9  CL 107 95* 102  CO2  --  24 25  GLUCOSE 217* 166* 171*  BUN 85* 64* 96*   CREATININE 9.80* 8.42* 10.08*  CALCIUM  --  9.0 9.1  PHOS  --  5.7* 5.9*   Liver Function Tests: Recent Labs  Lab 10/09/22 1035 10/12/22 0658  ALBUMIN 3.0* 2.7*   No results for input(s): "LIPASE", "AMYLASE" in the last 168 hours. No results for input(s): "AMMONIA" in the last 168 hours. CBC: Recent Labs  Lab 10/09/22 1410 10/10/22 0231 10/11/22 0220 10/12/22 0408 10/13/22 0156  WBC 7.8 7.2 6.5 6.6 7.6  HGB 8.4* 8.4* 8.2* 8.0* 9.4*  HCT 27.3* 27.7* 27.3* 26.5* 31.1*  MCV 82.5 80.8 81.3 80.3 81.2  PLT 198 202 209 241 266   Cardiac Enzymes: No results for input(s): "CKTOTAL", "CKMB", "CKMBINDEX", "TROPONINI" in the last 168 hours. CBG: Recent Labs  Lab 10/12/22 1319 10/12/22 1748 10/12/22 2117 10/13/22 0720 10/13/22 1106  GLUCAP 196* 163* 157* 163* 268*    Studies/Results: No results found. Medications:  magnesium sulfate bolus IVPB      vitamin C  1,000 mg Oral Daily   aspirin EC  325 mg Oral Daily   calcitRIOL  3 mcg Oral Q M,W,F-HD   Chlorhexidine Gluconate Cloth  6 each Topical Q0600   cinacalcet  90 mg Oral Q M,W,F-HD   darbepoetin (ARANESP) injection - DIALYSIS  100 mcg Subcutaneous Q Mon-1800   docusate sodium  100 mg Oral Daily   DULoxetine  60 mg Oral Daily   ezetimibe  10 mg Oral Daily   insulin aspart  0-9 Units Subcutaneous TID WC   insulin aspart  3 Units Subcutaneous TID WC   insulin glargine-yfgn  30 Units Subcutaneous QHS   ketotifen  1 drop Both Eyes BID   metoprolol succinate  50 mg Oral Once per day on Sunday Tuesday Thursday Saturday   nutrition supplement (JUVEN)  1 packet Oral BID BM   pantoprazole  40 mg Oral Daily   pregabalin  50 mg Oral TID   sertraline  50 mg Oral Daily   sucroferric oxyhydroxide  1,000 mg Oral TID WC   zinc sulfate  220 mg Oral Daily

## 2022-10-13 NOTE — Progress Notes (Addendum)
D/C order noted. Contacted [facility] to alert clinic that pt may d/c today if insurance auth for snf received. Insurance pending per CSW note. Will assist as needed.   Olivia Canter Renal Navigator [phone]  Addendum at 2:51 pm: Advised by CSW that pt will d/c tomorrow to snf. Contacted inpt HD unit to request that pt receive HD 1st shift if possible so pt can d/c to snf after HD. Contacted [facility] to advise clinic that pt will not be at appt tomorrow.

## 2022-10-13 NOTE — Plan of Care (Signed)
  Problem: Education: Goal: Ability to describe self-care measures that may prevent or decrease complications (Diabetes Survival Skills Education) will improve Outcome: Progressing   Problem: Coping: Goal: Ability to adjust to condition or change in health will improve Outcome: Progressing   Problem: Fluid Volume: Goal: Ability to maintain a balanced intake and output will improve Outcome: Progressing   Problem: Health Behavior/Discharge Planning: Goal: Ability to identify and utilize available resources and services will improve Outcome: Progressing Goal: Ability to manage health-related needs will improve Outcome: Progressing   Problem: Metabolic: Goal: Ability to maintain appropriate glucose levels will improve Outcome: Progressing   Problem: Nutritional: Goal: Maintenance of adequate nutrition will improve Outcome: Progressing Goal: Progress toward achieving an optimal weight will improve Outcome: Progressing   Problem: Skin Integrity: Goal: Risk for impaired skin integrity will decrease Outcome: Progressing   Problem: Tissue Perfusion: Goal: Adequacy of tissue perfusion will improve Outcome: Progressing   Problem: Education: Goal: Knowledge of the prescribed therapeutic regimen will improve Outcome: Progressing Goal: Ability to verbalize activity precautions or restrictions will improve Outcome: Progressing Goal: Understanding of discharge needs will improve Outcome: Progressing   Problem: Clinical Measurements: Goal: Postoperative complications will be avoided or minimized Outcome: Progressing   Problem: Self-Care: Goal: Ability to meet self-care needs will improve Outcome: Progressing   Problem: Pain Management: Goal: Pain level will decrease with appropriate interventions Outcome: Progressing   Problem: Skin Integrity: Goal: Demonstration of wound healing without infection will improve Outcome: Progressing   Problem: Health Behavior/Discharge  Planning: Goal: Ability to manage health-related needs will improve Outcome: Progressing   Problem: Clinical Measurements: Goal: Ability to maintain clinical measurements within normal limits will improve Outcome: Progressing   Problem: Nutrition: Goal: Adequate nutrition will be maintained Outcome: Progressing   Problem: Coping: Goal: Level of anxiety will decrease Outcome: Progressing   Problem: Elimination: Goal: Will not experience complications related to bowel motility Outcome: Progressing   Problem: Safety: Goal: Ability to remain free from injury will improve Outcome: Progressing   Problem: Clinical Measurements: Goal: Respiratory complications will improve Outcome: Completed/Met

## 2022-10-14 DIAGNOSIS — E1140 Type 2 diabetes mellitus with diabetic neuropathy, unspecified: Secondary | ICD-10-CM | POA: Diagnosis not present

## 2022-10-14 DIAGNOSIS — Z7401 Bed confinement status: Secondary | ICD-10-CM | POA: Diagnosis not present

## 2022-10-14 DIAGNOSIS — T8781 Dehiscence of amputation stump: Secondary | ICD-10-CM | POA: Diagnosis not present

## 2022-10-14 DIAGNOSIS — Z992 Dependence on renal dialysis: Secondary | ICD-10-CM | POA: Diagnosis not present

## 2022-10-14 DIAGNOSIS — M62521 Muscle wasting and atrophy, not elsewhere classified, right upper arm: Secondary | ICD-10-CM | POA: Diagnosis not present

## 2022-10-14 DIAGNOSIS — I5032 Chronic diastolic (congestive) heart failure: Secondary | ICD-10-CM | POA: Diagnosis not present

## 2022-10-14 DIAGNOSIS — M6281 Muscle weakness (generalized): Secondary | ICD-10-CM | POA: Diagnosis not present

## 2022-10-14 DIAGNOSIS — Z89511 Acquired absence of right leg below knee: Secondary | ICD-10-CM | POA: Diagnosis not present

## 2022-10-14 DIAGNOSIS — E1122 Type 2 diabetes mellitus with diabetic chronic kidney disease: Secondary | ICD-10-CM | POA: Diagnosis not present

## 2022-10-14 DIAGNOSIS — Z4781 Encounter for orthopedic aftercare following surgical amputation: Secondary | ICD-10-CM | POA: Diagnosis not present

## 2022-10-14 DIAGNOSIS — R41841 Cognitive communication deficit: Secondary | ICD-10-CM | POA: Diagnosis not present

## 2022-10-14 DIAGNOSIS — M869 Osteomyelitis, unspecified: Secondary | ICD-10-CM | POA: Diagnosis not present

## 2022-10-14 DIAGNOSIS — Z89512 Acquired absence of left leg below knee: Secondary | ICD-10-CM | POA: Diagnosis not present

## 2022-10-14 DIAGNOSIS — N186 End stage renal disease: Secondary | ICD-10-CM | POA: Diagnosis not present

## 2022-10-14 DIAGNOSIS — Z89422 Acquired absence of other left toe(s): Secondary | ICD-10-CM | POA: Insufficient documentation

## 2022-10-14 DIAGNOSIS — S88012A Complete traumatic amputation at knee level, left lower leg, initial encounter: Secondary | ICD-10-CM | POA: Diagnosis not present

## 2022-10-14 DIAGNOSIS — I739 Peripheral vascular disease, unspecified: Secondary | ICD-10-CM | POA: Diagnosis not present

## 2022-10-14 DIAGNOSIS — M62562 Muscle wasting and atrophy, not elsewhere classified, left lower leg: Secondary | ICD-10-CM | POA: Diagnosis not present

## 2022-10-14 DIAGNOSIS — I120 Hypertensive chronic kidney disease with stage 5 chronic kidney disease or end stage renal disease: Secondary | ICD-10-CM | POA: Diagnosis not present

## 2022-10-14 DIAGNOSIS — M62561 Muscle wasting and atrophy, not elsewhere classified, right lower leg: Secondary | ICD-10-CM | POA: Diagnosis not present

## 2022-10-14 DIAGNOSIS — R531 Weakness: Secondary | ICD-10-CM | POA: Diagnosis not present

## 2022-10-14 DIAGNOSIS — D631 Anemia in chronic kidney disease: Secondary | ICD-10-CM | POA: Diagnosis not present

## 2022-10-14 DIAGNOSIS — N2581 Secondary hyperparathyroidism of renal origin: Secondary | ICD-10-CM | POA: Diagnosis not present

## 2022-10-14 DIAGNOSIS — R2689 Other abnormalities of gait and mobility: Secondary | ICD-10-CM | POA: Diagnosis not present

## 2022-10-14 DIAGNOSIS — M62522 Muscle wasting and atrophy, not elsewhere classified, left upper arm: Secondary | ICD-10-CM | POA: Diagnosis not present

## 2022-10-14 DIAGNOSIS — R296 Repeated falls: Secondary | ICD-10-CM | POA: Diagnosis not present

## 2022-10-14 DIAGNOSIS — E1143 Type 2 diabetes mellitus with diabetic autonomic (poly)neuropathy: Secondary | ICD-10-CM | POA: Diagnosis not present

## 2022-10-14 LAB — CBC
HCT: 29.4 % — ABNORMAL LOW (ref 36.0–46.0)
Hemoglobin: 9.1 g/dL — ABNORMAL LOW (ref 12.0–15.0)
MCH: 24.7 pg — ABNORMAL LOW (ref 26.0–34.0)
MCHC: 31 g/dL (ref 30.0–36.0)
MCV: 79.9 fL — ABNORMAL LOW (ref 80.0–100.0)
Platelets: 273 10*3/uL (ref 150–400)
RBC: 3.68 MIL/uL — ABNORMAL LOW (ref 3.87–5.11)
RDW: 18.9 % — ABNORMAL HIGH (ref 11.5–15.5)
WBC: 7.6 10*3/uL (ref 4.0–10.5)
nRBC: 0 % (ref 0.0–0.2)

## 2022-10-14 LAB — GLUCOSE, CAPILLARY
Glucose-Capillary: 304 mg/dL — ABNORMAL HIGH (ref 70–99)
Glucose-Capillary: 143 mg/dL — ABNORMAL HIGH (ref 70–99)

## 2022-10-14 MED ORDER — HEPARIN SODIUM (PORCINE) 1000 UNIT/ML IJ SOLN
INTRAMUSCULAR | Status: AC
  Filled 2022-10-14: qty 2

## 2022-10-14 NOTE — Discharge Summary (Signed)
Discharge Diagnoses:  Principal Problem:   S/P BKA (below knee amputation) unilateral, left (HCC) Active Problems:   Subacute osteomyelitis, left ankle and foot (HCC)   Dehiscence of amputation stump of left lower extremity (HCC)   Surgeries: Procedure(s): LEFT BELOW KNEE AMPUTATION on 10/07/2022    Consultants: Treatment Team:  Anthony Sar, MD  Discharged Condition: Improved  Hospital Course: Kathryn Beck is an 67 y.o. female who was admitted 10/07/2022 with a chief complaint of left foot ulcer, with a final diagnosis of Dehiscence Left Transmetatarsal Amputation.  Patient was brought to the operating room on 10/07/2022 and underwent Procedure(s): LEFT BELOW KNEE AMPUTATION.    Patient was given perioperative antibiotics:  Anti-infectives (From admission, onward)    Start     Dose/Rate Route Frequency Ordered Stop   10/07/22 2200  ceFAZolin (ANCEF) IVPB 2g/100 mL premix  Status:  Discontinued        2 g 200 mL/hr over 30 Minutes Intravenous Every 8 hours 10/07/22 1652 10/07/22 1709   10/07/22 1145  ceFAZolin (ANCEF) IVPB 2g/100 mL premix        2 g 200 mL/hr over 30 Minutes Intravenous On call to O.R. 10/07/22 1138 10/07/22 1430     .  Patient was given sequential compression devices, early ambulation, and aspirin for DVT prophylaxis.  Recent vital signs: Patient Vitals for the past 24 hrs:  BP Temp Temp src Pulse Resp SpO2  10/14/22 0600 (!) 149/55 -- -- 68 -- --  10/14/22 0355 (!) 126/46 97.9 F (36.6 C) -- 70 18 100 %  10/14/22 0100 139/60 -- -- 77 19 --  10/13/22 1923 (!) 118/47 98.3 F (36.8 C) -- 69 19 99 %  10/13/22 1627 (!) 127/56 98.2 F (36.8 C) Oral 67 18 99 %  10/13/22 0727 135/60 97.9 F (36.6 C) Oral 87 18 98 %  .  Recent laboratory studies: No results found.  Discharge Medications:   Allergies as of 10/14/2022       Reactions   Ace Inhibitors Anaphylaxis, Swelling   Penicillins Itching, Swelling, Other (See Comments)   Did it involve swelling  of the face/tongue/throat, SOB, or low BP? Unknown Did it involve sudden or severe rash/hives, skin peeling, or any reaction on the inside of your mouth or nose? Unknown Did you need to seek medical attention at a hospital or doctor's office? Unknown When did it last happen?      years  If all above answers are "NO", may proceed with cephalosporin use.   Statins Other (See Comments)   elevated LFT's   Albuterol Swelling        Medication List     TAKE these medications    aspirin 81 MG chewable tablet CHEW TWO TABLETS BY MOUTH EVERY DAY What changed: See the new instructions.   azelastine 0.05 % ophthalmic solution Commonly known as: OPTIVAR Place 1 drop into both eyes 2 (two) times daily. What changed:  when to take this reasons to take this   calcitRIOL 0.25 MCG capsule Commonly known as: ROCALTROL TAKE 1 CAPSULE (0.25 MCG TOTAL) BY MOUTH 2 (TWO) TIMES DAILY WITH A MEAL.   cinacalcet 30 MG tablet Commonly known as: SENSIPAR Take 3 tablets (90 mg total) by mouth 3 (three) times a week.   ciprofloxacin 500 MG tablet Commonly known as: Cipro Take 1 tablet (500 mg total) by mouth at bedtime for 42 doses. Take after hemodialysis on HD days   doxycycline 100 MG capsule Commonly known as:  VIBRAMYCIN Take 1 capsule (100 mg total) by mouth 2 (two) times daily.   DULoxetine 60 MG capsule Commonly known as: CYMBALTA TAKE (1) CAPSULE BY MOUTH ONCE DAILY. What changed: See the new instructions.   ezetimibe 10 MG tablet Commonly known as: ZETIA Take 1 tablet (10 mg total) by mouth daily.   Fiasp FlexTouch 100 UNIT/ML FlexTouch Pen Generic drug: insulin aspart Inject 10-20 Units into the skin See admin instructions. Inj 10-20 three times daily via sliding scale   FreeStyle Libre 2 Reader Devi 1 each by Does not apply route daily.   FreeStyle Libre 2 Sensor Misc 1 each by Does not apply route every 14 (fourteen) days.   furosemide 80 MG tablet Commonly known as:  LASIX Take 80 mg by mouth 2 (two) times daily.   hydrOXYzine 25 MG tablet Commonly known as: ATARAX Take 25 mg by mouth every 12 (twelve) hours as needed for itching.   insulin aspart 100 UNIT/ML injection Commonly known as: novoLOG Inject 1-10 Units into the skin 3 (three) times daily before meals. Sliding scale 101-150=1 units 151-200=2 units 201-250=4 units 251-300=6 units 301-350=8 units 351-450=10 units Above 450 call MD   Insulin Pen Needle 32G X 4 MM Misc Use 4x a day   metoprolol succinate 50 MG 24 hr tablet Commonly known as: TOPROL-XL TAKE (1) TABLET BY MOUTH DAILY WITH FOOD *TAKE AFTER DIALYSIS* What changed:  how much to take how to take this when to take this additional instructions   OneTouch Delica Lancets 33G Misc Use to check blood sugar 4X daily.   OneTouch Verio test strip Generic drug: glucose blood Use as instructed to check blood sugar 4X daily.   OneTouch Verio w/Device Kit Use to check blood sugar 4X daily.   oxyCODONE-acetaminophen 5-325 MG tablet Commonly known as: PERCOCET/ROXICET Take 1 tablet by mouth every 4 (four) hours as needed.   pregabalin 50 MG capsule Commonly known as: LYRICA TAKE ONE CAPSULE BY MOUTH three times a day   Rena-Vite Rx 1 MG Tabs Take 1 tablet by mouth daily.   sertraline 50 MG tablet Commonly known as: ZOLOFT TAKE ONE TABLET BY MOUTH EVERY DAY What changed: how much to take   torsemide 100 MG tablet Commonly known as: DEMADEX TAKE 1 TABLET BY MOUTH TWICE DAILY *HOLD MORNING DOSE ON DIALYSIS DAYS*   Toujeo Max SoloStar 300 UNIT/ML Solostar Pen Generic drug: insulin glargine (2 Unit Dial) Inject 30 Units into the skin daily. May need to slowly increase the dose depending upon your blood sugar, follow-up with PCP   [name] 500 MG chewable tablet Generic drug: sucroferric oxyhydroxide Chew 500-1,000 mg by mouth See admin instructions. Take 1000mg  (2 tablets) by mouth with meals and 500mg  (1 tablet)  with snacks        Diagnostic Studies: DG Foot 2 Views Left  Result Date: 10/01/2022 CLINICAL DATA:  Postoperative wound. EXAM: LEFT FOOT - 2 VIEW COMPARISON:  September 25, 2022. FINDINGS: Status post transmetatarsal amputation of the foot. Expected postoperative changes including surgical staples are seen in the stomach. Vascular calcifications are noted. There does appear to be air within a wound within the soft tissues of the stump distally. No definite evidence of osteomyelitis. IMPRESSION: Status post transmetatarsal amputation of all 5 rays. There does appear to be and opened and air-filled wound in the distal stump, but no definite osteomyelitis is noted. Electronically Signed   By: Lupita Raider M.D.   On: 10/01/2022 15:35   DG Foot  Complete Left  Result Date: 09/25/2022 CLINICAL DATA:  Wound infection. EXAM: LEFT FOOT - COMPLETE 3+ VIEW COMPARISON:  Radiograph 09/05/2022 FINDINGS: Previous transmetatarsal amputation of all 5 rays. Resection margins of the first, second and fifth metatarsals are indistinct. Overlying skin staples remain in place. Small amount of air is seen in the operative bed. No evidence of acute fracture. Extensive vascular calcifications. IMPRESSION: 1. Previous transmetatarsal amputation of all 5 rays. Resection margins of the first, second and fifth metatarsals are indistinct, suspicious for osteomyelitis. 2. Soft tissue gas in the operative bed, nonspecific, but not typically seen greater than 2 weeks out from surgery. Overlying skin staples remain in place. Electronically Signed   By: Narda Rutherford M.D.   On: 09/25/2022 17:09    Patient benefited maximally from their hospital stay and there were no complications.     Disposition: Discharge disposition: 62-Rehab Facility      Discharge Instructions     Call MD / Call 911   Complete by: As directed    If you experience chest pain or shortness of breath, CALL 911 and be transported to the hospital emergency  room.  If you develope a fever above 101 F, pus (white drainage) or increased drainage or redness at the wound, or calf pain, call your surgeon's office.   Call MD / Call 911   Complete by: As directed    If you experience chest pain or shortness of breath, CALL 911 and be transported to the hospital emergency room.  If you develope a fever above 101 F, pus (white drainage) or increased drainage or redness at the wound, or calf pain, call your surgeon's office.   Call MD / Call 911   Complete by: As directed    If you experience chest pain or shortness of breath, CALL 911 and be transported to the hospital emergency room.  If you develope a fever above 101 F, pus (white drainage) or increased drainage or redness at the wound, or calf pain, call your surgeon's office.   Constipation Prevention   Complete by: As directed    Drink plenty of fluids.  Prune juice may be helpful.  You may use a stool softener, such as Colace (over the counter) 100 mg twice a day.  Use MiraLax (over the counter) for constipation as needed.   Constipation Prevention   Complete by: As directed    Drink plenty of fluids.  Prune juice may be helpful.  You may use a stool softener, such as Colace (over the counter) 100 mg twice a day.  Use MiraLax (over the counter) for constipation as needed.   Constipation Prevention   Complete by: As directed    Drink plenty of fluids.  Prune juice may be helpful.  You may use a stool softener, such as Colace (over the counter) 100 mg twice a day.  Use MiraLax (over the counter) for constipation as needed.   Diet - low sodium heart healthy   Complete by: As directed    Diet - low sodium heart healthy   Complete by: As directed    Diet - low sodium heart healthy   Complete by: As directed    Increase activity slowly as tolerated   Complete by: As directed    Increase activity slowly as tolerated   Complete by: As directed    Increase activity slowly as tolerated   Complete by: As  directed    Post-operative opioid taper instructions:   Complete by: As  directed    POST-OPERATIVE OPIOID TAPER INSTRUCTIONS: It is important to wean off of your opioid medication as soon as possible. If you do not need pain medication after your surgery it is ok to stop day one. Opioids include: Codeine, Hydrocodone(Norco, Vicodin), Oxycodone(Percocet, oxycontin) and hydromorphone amongst others.  Long term and even short term use of opiods can cause: Increased pain response Dependence Constipation Depression Respiratory depression And more.  Withdrawal symptoms can include Flu like symptoms Nausea, vomiting And more Techniques to manage these symptoms Hydrate well Eat regular healthy meals Stay active Use relaxation techniques(deep breathing, meditating, yoga) Do Not substitute Alcohol to help with tapering If you have been on opioids for less than two weeks and do not have pain than it is ok to stop all together.  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 Maintain the same interval or time between taking each dose and first decrease the dose.  Cut the total daily intake of opioids by one tablet each day Next start to increase the time between doses. The last dose that should be eliminated is the evening dose.      Post-operative opioid taper instructions:   Complete by: As directed    POST-OPERATIVE OPIOID TAPER INSTRUCTIONS: It is important to wean off of your opioid medication as soon as possible. If you do not need pain medication after your surgery it is ok to stop day one. Opioids include: Codeine, Hydrocodone(Norco, Vicodin), Oxycodone(Percocet, oxycontin) and hydromorphone amongst others.  Long term and even short term use of opiods can cause: Increased pain response Dependence Constipation Depression Respiratory depression And more.  Withdrawal symptoms can include Flu like symptoms Nausea,  vomiting And more Techniques to manage these symptoms Hydrate well Eat regular healthy meals Stay active Use relaxation techniques(deep breathing, meditating, yoga) Do Not substitute Alcohol to help with tapering If you have been on opioids for less than two weeks and do not have pain than it is ok to stop all together.  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 Maintain the same interval or time between taking each dose and first decrease the dose.  Cut the total daily intake of opioids by one tablet each day Next start to increase the time between doses. The last dose that should be eliminated is the evening dose.      Post-operative opioid taper instructions:   Complete by: As directed    POST-OPERATIVE OPIOID TAPER INSTRUCTIONS: It is important to wean off of your opioid medication as soon as possible. If you do not need pain medication after your surgery it is ok to stop day one. Opioids include: Codeine, Hydrocodone(Norco, Vicodin), Oxycodone(Percocet, oxycontin) and hydromorphone amongst others.  Long term and even short term use of opiods can cause: Increased pain response Dependence Constipation Depression Respiratory depression And more.  Withdrawal symptoms can include Flu like symptoms Nausea, vomiting And more Techniques to manage these symptoms Hydrate well Eat regular healthy meals Stay active Use relaxation techniques(deep breathing, meditating, yoga) Do Not substitute Alcohol to help with tapering If you have been on opioids for less than two weeks and do not have pain than it is ok to stop all together.  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 Maintain the same interval or time between taking each dose and first  decrease the dose.  Cut the total daily intake of opioids by one tablet each day Next start to increase the time between  doses. The last dose that should be eliminated is the evening dose.          Follow-up Information     [name], MD Follow up in 1 week(s).   Specialty: Orthopedic Surgery Contact information: [address] [place] [address] [phone]                  Signed: [name] 10/14/2022, 6:58 AM

## 2022-10-14 NOTE — Progress Notes (Signed)
Advised by CSW that pt will d/c after HD to snf. Contacted [facility] and advised clinic of pt's d/c today to snf and that pt should resume on Friday. D/C summary and today's renal note faxed to clinic for continuation of care.   Olivia Canter Renal Navigator [phone]

## 2022-10-14 NOTE — Progress Notes (Signed)
Subjective: Seen on hemodialysis tolerating UF no complaints.  For discharge to SNF for rehab  Objective Vital signs in last 24 hours: Vitals:   10/14/22 0930 10/14/22 1000 10/14/22 1030 10/14/22 1100  BP: (!) 140/60 132/63 136/67 (!) 123/58  Pulse: 68 66 65 71  Resp: 14 11 13 13   Temp:      TempSrc:      SpO2: 100% 100% 100% 100%  Weight:      Height:       Weight change:   Physical Exam: General:  Alert adult female  NAD Heart: RRR 1/6/sem , no rg  Lungs: CTA Non labored  Abdomen: Obese, NABS , NT, ND  Extremities: L BKA dressing dry clear, R BKA  no edema  Dialysis Access: L UA AVF patent on HD    OP Dialysis Orders:  (per previous admit): MWF at [facility] 3:45hr, 400/500, EDW 80g, 2K/2.5Ca, UFP#2, LUE AVF 15g, heparin 1000 unit initial + 1000 unit/hr Meds: Mircera 60 mcg every 2 weeks (last dose 7/8), Venofer 50 mg once weekly, calcitriol 3 mcg every treatment, Sensipar 90 mg every treatment   Problem/Plan: 1. 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 Lajoyce Corners. Awaiting /snf /rehab bed.  2. ESRD - on HD MWF.  Tolerating well, continue regular schedule  3. Anemia of CKD- Hgb 9.1< 8.0<8.2- declined post op.  100 mcg aranesp  SQ start 7/22 4. Secondary hyperparathyroidism - Calcium controlled, phos 5.9 Continue VDRA, sensipar and binders.  5. HTN/volume - Blood pressure variable, stable today.  Near goal.  Continue home meds.Will need new edw on discharge. 77kg notified outpatient kidney center 6. Nutrition - Renal diet with fluid restrictions.   Lenny Pastel, PA-C [facility] Beeper [phone] 10/14/2022,11:06 AM  LOS: 7 days   Labs: Basic Metabolic Panel: Recent Labs  Lab 10/07/22 1211 10/09/22 1035 10/12/22 0658  NA 139 138 138  K 5.3* 4.6 4.9  CL 107 95* 102  CO2  --  24 25  GLUCOSE 217* 166* 171*  BUN 85* 64* 96*  CREATININE 9.80* 8.42* 10.08*  CALCIUM  --  9.0 9.1  PHOS  --  5.7* 5.9*   Liver Function Tests: Recent Labs  Lab  10/09/22 1035 10/12/22 0658  ALBUMIN 3.0* 2.7*   No results for input(s): "LIPASE", "AMYLASE" in the last 168 hours. No results for input(s): "AMMONIA" in the last 168 hours. CBC: Recent Labs  Lab 10/10/22 0231 10/11/22 0220 10/12/22 0408 10/13/22 0156 10/14/22 0116  WBC 7.2 6.5 6.6 7.6 7.6  HGB 8.4* 8.2* 8.0* 9.4* 9.1*  HCT 27.7* 27.3* 26.5* 31.1* 29.4*  MCV 80.8 81.3 80.3 81.2 79.9*  PLT 202 209 241 266 273   Cardiac Enzymes: No results for input(s): "CKTOTAL", "CKMB", "CKMBINDEX", "TROPONINI" in the last 168 hours. CBG: Recent Labs  Lab 10/13/22 0720 10/13/22 1106 10/13/22 1626 10/13/22 2123 10/14/22 0653  GLUCAP 163* 268* 88 193* 304*    Studies/Results: No results found. Medications:  magnesium sulfate bolus IVPB      vitamin C  1,000 mg Oral Daily   aspirin EC  325 mg Oral Daily   calcitRIOL  3 mcg Oral Q M,W,F-HD   Chlorhexidine Gluconate Cloth  6 each Topical Q0600   cinacalcet  90 mg Oral Q M,W,F-HD   darbepoetin (ARANESP) injection - DIALYSIS  100 mcg Subcutaneous Q Mon-1800   docusate sodium  100 mg Oral Daily   DULoxetine  60 mg Oral Daily  ezetimibe  10 mg Oral Daily   heparin sodium (porcine)       insulin aspart  0-9 Units Subcutaneous TID WC   insulin aspart  3 Units Subcutaneous TID WC   insulin glargine-yfgn  30 Units Subcutaneous QHS   ketotifen  1 drop Both Eyes BID   metoprolol succinate  50 mg Oral Once per day on Sunday Tuesday Thursday Saturday   nutrition supplement (JUVEN)  1 packet Oral BID BM   pantoprazole  40 mg Oral Daily   pregabalin  50 mg Oral TID   sertraline  50 mg Oral Daily   sucroferric oxyhydroxide  1,000 mg Oral TID WC   zinc sulfate  220 mg Oral Daily

## 2022-10-14 NOTE — Progress Notes (Signed)
   10/14/22 1219  Vitals  Temp 97.6 F (36.4 C)  Pulse Rate 67  Resp 10  BP (!) 118/58  SpO2 100 %  Weight 74.1 kg  Post Treatment  Dialyzer Clearance Clear  Duration of HD Treatment -hour(s) 3.75 hour(s)  Hemodialysis Intake (mL) 0 mL  Liters Processed 90  Fluid Removed (mL) 2000 mL  Tolerated HD Treatment Yes  AVG/AVF Arterial Site Held (minutes) 8 minutes  AVG/AVF Venous Site Held (minutes) 8 minutes   Received patient in bed to unit.  Alert and oriented.  Informed consent signed and in chart.   TX duration:3.75HRS  Patient tolerated well.  Transported back to the room  Alert, without acute distress.  Hand-off given to patient's nurse.   Access used: LAVF Access issues: NONE  Total UF removed: 2L Medication(s) given: NONE   [facility]

## 2022-10-14 NOTE — TOC Transition Note (Addendum)
Transition of Care [facility]) - CM/SW Discharge Note   Patient Details  Name: Kathryn Beck MRN: [id number] Date of Birth: March 23, 1956  [facility]) CM/SW Contact:  Erin Sons, LCSW Phone Number: 10/14/2022, 1:12 PM   Clinical Narrative:     Berkley Harvey approved 7/23-7/25 HYQM#[id number]  Patient will DC to: [facility] Anticipated DC date: 10/14/22 Family notified: Daughter [name] Transport by: Sharin Mons   Per MD patient ready for DC to [facility]. RN, patient, patient's family, and facility notified of DC. Discharge Summary and FL2 sent to facility. RN to call report prior to discharge [phone]). DC packet on chart. Ambulance transport requested for patient.   CSW will sign off for now as social work intervention is no longer needed. Please consult [place] again if new needs arise.     Barriers to Discharge: No Barriers Identified   Patient Goals and CMS Choice      Discharge Placement                Patient chooses bed at:  [facility]) Patient to be transferred to facility by: [facility] Name of family member notified: Daughter [name] Patient and family notified of of transfer: 10/14/22  Discharge Plan and Services Additional resources added to the After Visit Summary for                                       Social Determinants of Health (SDOH) Interventions SDOH Screenings   Food Insecurity: No Food Insecurity (10/07/2022)  Housing: Low Risk  (10/07/2022)  Transportation Needs: No Transportation Needs (10/07/2022)  Utilities: Not At Risk (10/07/2022)  Alcohol Screen: Low Risk  (05/12/2022)  Depression (PHQ2-9): Low Risk  (07/07/2022)  Recent Concern: Depression (PHQ2-9) - High Risk (05/12/2022)  Financial Resource Strain: Medium Risk (05/12/2022)  Physical Activity: Inactive (05/12/2022)  Social Connections: Socially Integrated (05/12/2022)  Stress: No Stress Concern Present (05/12/2022)  Tobacco Use: Low Risk  (10/12/2022)     Readmission  Risk Interventions    05/01/2022    3:54 PM  Readmission Risk Prevention Plan  Transportation Screening Complete  HRI or Home Care Consult Complete  Social Work Consult for Recovery Care Planning/Counseling --  Palliative Care Screening Not Applicable  Medication Review Oceanographer) Complete

## 2022-10-15 ENCOUNTER — Ambulatory Visit (INDEPENDENT_AMBULATORY_CARE_PROVIDER_SITE_OTHER): Payer: Medicare PPO | Admitting: Orthopedic Surgery

## 2022-10-15 ENCOUNTER — Encounter: Payer: Self-pay | Admitting: Orthopedic Surgery

## 2022-10-15 DIAGNOSIS — Z89511 Acquired absence of right leg below knee: Secondary | ICD-10-CM

## 2022-10-15 NOTE — Progress Notes (Signed)
Office Visit Note   Patient: Kathryn Beck           Date of Birth: 1955-08-24           MRN: [id number] Visit Date: 10/15/2022              Requested by: [name], MD [address], [address] [place],  [place] [address] PCP: [name], MD  Chief Complaint  Patient presents with   Left Leg - Routine Post Op    10/07/2022 left BKA       HPI: Patient is a 67 year old woman who is status post left transtibial amputation.  The wound VAC has just been discontinued.  She has a limb protector and a stump shrinker.  Assessment & Plan: Visit Diagnoses:  1. Right below-knee amputee [facility])     Plan: The incision is well-approximated.  Plan to follow-up in 2 weeks to remove the staples.  Continue with Dial soap cleansing and wearing the shrinker around-the-clock.  Use the limb protector when ambulating.  Follow-Up Instructions: Return in about 2 weeks (around 10/29/2022).   Ortho Exam  Patient is alert, oriented, no adenopathy, well-dressed, normal affect, normal respiratory effort. Examination the residual limb is well consolidated no drainage no cellulitis.  There is some swelling.  Imaging: No results found. No images are attached to the encounter.  Labs: Lab Results  Component Value Date   HGBA1C 6.3 (H) 07/07/2022   HGBA1C 9.3 (A) 04/16/2022   HGBA1C 8.9 (A) 10/28/2021   ESRSEDRATE 70 (H) 09/03/2022   ESRSEDRATE 79 (H) 08/13/2022   ESRSEDRATE 37 (H) 04/25/2022   CRP 0.9 09/03/2022   CRP 9.1 (H) 08/13/2022   CRP 29.4 (H) 04/25/2022   LABURIC 1.8 (L) 06/07/2018   LABURIC 6.2 04/22/2018   LABURIC 9.3 (H) 10/07/2015   REPTSTATUS 09/09/2022 FINAL 09/04/2022   GRAMSTAIN  09/04/2022    RARE WBC PRESENT, PREDOMINANTLY PMN NO ORGANISMS SEEN    CULT  09/04/2022    RARE ENTEROBACTER HORMAECHEI NO ANAEROBES ISOLATED Performed at [facility], [address], [place], [place] [address]    LABORGA ENTEROBACTER HORMAECHEI 09/04/2022     Lab  Results  Component Value Date   ALBUMIN 2.7 (L) 10/12/2022   ALBUMIN 3.0 (L) 10/09/2022   ALBUMIN 3.6 10/01/2022   PREALBUMIN 26 08/13/2022   PREALBUMIN 24 04/25/2022    Lab Results  Component Value Date   MG 1.9 09/08/2022   MG 2.3 09/07/2022   MG 1.9 09/06/2022   Lab Results  Component Value Date   VD25OH 30.9 09/28/2017   VD25OH 15 (L) 10/07/2015   VD25OH 16 (L) 04/25/2015    Lab Results  Component Value Date   PREALBUMIN 26 08/13/2022   PREALBUMIN 24 04/25/2022      Latest Ref Rng & Units 10/14/2022    1:16 AM 10/13/2022    1:56 AM 10/12/2022    4:08 AM  CBC EXTENDED  WBC 4.0 - 10.5 K/uL 7.6  7.6  6.6   RBC 3.87 - 5.11 MIL/uL 3.68  3.83  3.30   Hemoglobin 12.0 - 15.0 g/dL 9.1  9.4  8.0   HCT 32.4 - 46.0 % 29.4  31.1  26.5   Platelets 150 - 400 K/uL 273  266  241      There is no height or weight on file to calculate BMI.  Orders:  No orders of the defined types were placed in this encounter.  No orders  of the defined types were placed in this encounter.    Procedures: No procedures performed  Clinical Data: No additional findings.  ROS:  All other systems negative, except as noted in the HPI. Review of Systems  Objective: Vital Signs: There were no vitals taken for this visit.  Specialty Comments:  No specialty comments available.  PMFS History: Patient Active Problem List   Diagnosis Date Noted   Dehiscence of amputation stump of left lower extremity (HCC) 10/07/2022   S/P BKA (below knee amputation) unilateral, left (HCC) 10/07/2022   Subacute osteomyelitis, left ankle and foot (HCC) 09/03/2022   Left foot infection 09/03/2022   Status post transmetatarsal amputation of foot, left (HCC) 09/03/2022   Wound infection after surgery 09/03/2022   Equinus contracture of left ankle 08/15/2022   Acute osteomyelitis of toe of left foot (HCC) 08/13/2022   Peripheral vascular complication 07/21/2022   PVD (peripheral vascular disease) (HCC)  07/18/2022   Toe ulcer, left, limited to breakdown of skin (HCC) 07/13/2022   S/P BKA (below knee amputation), right (HCC) 05/29/2022   Confusion 05/19/2022   Visual hallucinations 05/19/2022   Watery eyes 05/19/2022   Requires assistance with activities of daily living (ADL) 04/26/2022   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   Gangrene of right foot (HCC) 04/25/2022   PAD (peripheral artery disease) (HCC) 04/25/2022   Other osteoporosis without current pathological fracture 11/10/2021   Hypocalcemia 11/10/2021   Fall 11/02/2021   Left foot pain 10/31/2021   Low back pain with left-sided sciatica 10/31/2021   Left ear impacted cerumen 09/18/2021   Chronic left shoulder pain 06/10/2021   Abnormal CXR 03/10/2021   Ear pain, bilateral 03/10/2021   Morbid obesity (HCC) 03/10/2021   Subungual hematoma of second toe of left foot 10/28/2020   Insomnia 10/28/2020   Memory loss or impairment 09/26/2020   Forgetfulness 09/26/2020   Recurrent vertigo 09/26/2020   Abnormal MRI, cervical spine 03/04/2020   Right leg weakness 02/28/2020   Knee pain, right 10/23/2019   Cough 08/09/2019   Carpal tunnel syndrome 06/13/2019   Chronic pain syndrome 06/13/2019   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) 06/13/2019   Neck pain 03/23/2019   Near syncope 12/05/2018   ESRD on hemodialysis (HCC) 11/22/2018   Ischemic heart disease 09/25/2018   Not currently working due to disabled status 06/13/2018   HTN (hypertension) 06/12/2018   Depression 03/09/2018   Adrenal mass, left (HCC) 11/09/2017   MGUS (monoclonal gammopathy of unknown significance) 11/05/2017   Shoulder pain 11/01/2017   Chronic diastolic (congestive) heart failure (HCC) 09/20/2017   Bilateral leg weakness 09/09/2017   Adrenal adenoma 09/03/2017   Uncontrolled type 2 diabetes mellitus 08/18/2017   Unsteady gait 07/11/2017   Recurrent falls 07/11/2017   Anemia of chronic disease 03/24/2017   Personal  history of colonic polyps 02/10/2017   Dysphagia 11/05/2016   Irritable bowel syndrome 02/04/2016   Hospital discharge follow-up 10/27/2015   Intolerant of heat 10/07/2014   Cardiac murmur 01/17/2014   Back pain with left-sided radiculopathy 09/18/2013   Seasonal allergies 03/10/2013   Lipoma of back 12/22/2012   Other seasonal allergic rhinitis 08/22/2012   OSA (obstructive sleep apnea) 06/02/2011   Chronic kidney disease, stage 4 (severe) (HCC) 01/13/2011   Neuropathy 04/16/2010   Malaise and fatigue 07/24/2009   Personal history of other diseases of the nervous system and sense organs 05/08/2009   LUPUS ERYTHEMATOSUS, DISCOID 06/05/2008   Arm pain 05/30/2008  Hyperlipidemia 06/07/2007   Obesity 06/07/2007   Malignant hypertension 06/07/2007   Type 2 diabetes mellitus with hyperglycemia (HCC) 06/07/2007   Diabetes mellitus (HCC) 06/07/2007   Past Medical History:  Diagnosis Date   Acid reflux    Anemia    Arthritis    Axillary masses    Soft tissue - status post excision   Back pain    Cancer (HCC)    CHF (congestive heart failure) (HCC)    COVID-19 virus infection 04/06/2019   Depression    End-stage renal disease (HCC)    M/W/F dialysis   Essential hypertension    Headache    years ago   History of blood transfusion    History of cardiac catheterization    Normal coronary arteries October 2020   History of claustrophobia    History of pneumonia 2019   Hypoxia 04/03/2019   Memory loss    Mixed hyperlipidemia    Obesity    Pancreatitis    Peritoneal dialysis catheter in place [facility])    Pneumonia due to COVID-19 virus 04/02/2019   Sleep apnea    Noncompliant with CPAP   Stroke (HCC)    mini stroke   Type 2 diabetes mellitus (HCC)     Family History  Problem Relation Age of Onset   Hypertension Father    Hypercholesterolemia Father    Arthritis Father    Hypertension Sister    Hypercholesterolemia Sister    Breast cancer Sister    Hypertension Sister     Colon cancer Neg Hx    Colon polyps Neg Hx     Past Surgical History:  Procedure Laterality Date   ABDOMINAL AORTOGRAM W/LOWER EXTREMITY N/A 04/30/2022   Procedure: ABDOMINAL AORTOGRAM W/LOWER EXTREMITY;  Surgeon: Leonie Douglas, MD;  Location: [facility] INVASIVE CV LAB;  Service: Cardiovascular;  Laterality: N/A;   ABDOMINAL AORTOGRAM W/LOWER EXTREMITY N/A 07/21/2022   Procedure: ABDOMINAL AORTOGRAM W/LOWER EXTREMITY;  Surgeon: Nada Libman, MD;  Location: [facility];  Service: Cardiovascular;  Laterality: N/A;   ABDOMINAL HYSTERECTOMY     ACHILLES TENDON LENGTHENING  08/15/2022   Procedure: ACHILLES TENDON LENGTHENING;  Surgeon: [name], DPM;  Location: [facility] OR;  Service: Podiatry;;   AMPUTATION Right 05/29/2022   Procedure: RIGHT BELOW THE KNEE AMPUTATION;  Surgeon: [name], MD;  Location: [facility] OR;  Service: Orthopedics;  Laterality: Right;   AMPUTATION Left 09/04/2022   Procedure: AMPUTATION FOOT, serial irrigation;  Surgeon: Louann Sjogren, DPM;  Location: [facility] OR;  Service: Podiatry;  Laterality: Left;  Surgical team to do block   AMPUTATION Left 10/07/2022   Procedure: LEFT BELOW KNEE AMPUTATION;  Surgeon: [name], MD;  Location: [facility] OR;  Service: Orthopedics;  Laterality: Left;   AV FISTULA PLACEMENT Left 09/02/2017   Procedure: creation of left arm ARTERIOVENOUS (AV) FISTULA;  Surgeon: Nada Libman, MD;  Location: [facility] OR;  Service: Vascular;  Laterality: Left;   COLONOSCOPY  2008   Dr. Darrick Penna: normal    COLONOSCOPY N/A 12/18/2016   Dr. Darrick Penna: multiple tubular adenomas, internal hemorrhoids. Surveillance in 3 years    ESOPHAGEAL DILATION N/A 10/13/2015   Procedure: ESOPHAGEAL DILATION;  Surgeon: Malissa Hippo, MD;  Location: [facility];  Service: Endoscopy;  Laterality: N/A;   ESOPHAGOGASTRODUODENOSCOPY N/A 10/13/2015   Dr. Karilyn Cota: chronic gastritis on path, no H.pylori. Empiric dilation    ESOPHAGOGASTRODUODENOSCOPY N/A 12/18/2016   Dr. Darrick Penna: mild  gastritis. BRAVO study revealed uncontrolled GERD. Dysphagia secondary  to uncontrolled reflux   FOOT SURGERY Bilateral    "nerve"     LEFT HEART CATH AND CORONARY ANGIOGRAPHY N/A 12/29/2018   Procedure: LEFT HEART CATH AND CORONARY ANGIOGRAPHY;  Surgeon: Corky Crafts, MD;  Location: [facility] INVASIVE CV LAB;  Service: Cardiovascular;  Laterality: N/A;   LOWER EXTREMITY ANGIOGRAPHY Right 05/04/2022   Procedure: Lower Extremity Angiography;  Surgeon: Victorino Sparrow, MD;  Location: [facility] INVASIVE CV LAB;  Service: Cardiovascular;  Laterality: Right;   LUNG BIOPSY     MASS EXCISION Right 01/09/2013   Procedure: EXCISION OF NEOPLASM OF RIGHT  AXILLA  AND EXCISION OF NEOPLASM OF LEFT AXILLA;  Surgeon: Dalia Heading, MD;  Location: [facility] ORS;  Service: General;  Laterality: Right;  procedure end @ 08:23   MYRINGOTOMY WITH TUBE PLACEMENT Bilateral 04/28/2017   Procedure: BILATERAL MYRINGOTOMY WITH TUBE PLACEMENT;  Surgeon: Newman Pies, MD;  Location: [facility] OR;  Service: ENT;  Laterality: Bilateral;   PERIPHERAL VASCULAR BALLOON ANGIOPLASTY Right 05/04/2022   Procedure: PERIPHERAL VASCULAR BALLOON ANGIOPLASTY;  Surgeon: Victorino Sparrow, MD;  Location: [facility] INVASIVE CV LAB;  Service: Cardiovascular;  Laterality: Right;  PT   PERIPHERAL VASCULAR INTERVENTION Right 05/04/2022   Procedure: PERIPHERAL VASCULAR INTERVENTION;  Surgeon: Victorino Sparrow, MD;  Location: [facility] INVASIVE CV LAB;  Service: Cardiovascular;  Laterality: Right;  SFA   PERIPHERAL VASCULAR INTERVENTION Left 07/21/2022   Procedure: PERIPHERAL VASCULAR INTERVENTION;  Surgeon: Nada Libman, MD;  Location: [facility];  Service: Cardiovascular;  Laterality: Left;   REVISION OF ARTERIOVENOUS GORETEX GRAFT Left 05/04/2018   Procedure: TRANSPOSITION OF CEPHALIC VEIN ARTERIOVENOUS FISTULA LEFT ARM;  Surgeon: Larina Earthly, MD;  Location: [facility] OR;  Service: Vascular;  Laterality: Left;   SAVORY DILATION N/A 12/18/2016   Procedure: SAVORY DILATION;  Surgeon:  West Bali, MD;  [facility];  Service: Endoscopy;  Laterality: N/A;   TRANSMETATARSAL AMPUTATION Left 08/15/2022   Procedure: TRANSMETATARSAL AMPUTATION;  Surgeon: [name], DPM;  Location: [facility] OR;  Service: Podiatry;  Laterality: Left;   Social History   Occupational History   Occupation: retired   Tobacco Use   Smoking status: Never    Passive exposure: Never   Smokeless tobacco: Never   Tobacco comments:    Verified by Daughter, [name]  Vaping Use   Vaping status: Never Used  Substance and Sexual Activity   Alcohol use: No   Drug use: No   Sexual activity: Not Currently    Partners: Male

## 2022-10-16 DIAGNOSIS — N186 End stage renal disease: Secondary | ICD-10-CM | POA: Diagnosis not present

## 2022-10-16 DIAGNOSIS — Z992 Dependence on renal dialysis: Secondary | ICD-10-CM | POA: Diagnosis not present

## 2022-10-16 DIAGNOSIS — N2581 Secondary hyperparathyroidism of renal origin: Secondary | ICD-10-CM | POA: Diagnosis not present

## 2022-10-18 ENCOUNTER — Other Ambulatory Visit: Payer: Self-pay | Admitting: Internal Medicine

## 2022-10-19 DIAGNOSIS — N2581 Secondary hyperparathyroidism of renal origin: Secondary | ICD-10-CM | POA: Diagnosis not present

## 2022-10-19 DIAGNOSIS — N186 End stage renal disease: Secondary | ICD-10-CM | POA: Diagnosis not present

## 2022-10-19 DIAGNOSIS — Z992 Dependence on renal dialysis: Secondary | ICD-10-CM | POA: Diagnosis not present

## 2022-10-20 ENCOUNTER — Encounter: Payer: Medicare PPO | Admitting: Physical Therapy

## 2022-10-21 DIAGNOSIS — N186 End stage renal disease: Secondary | ICD-10-CM | POA: Diagnosis not present

## 2022-10-21 DIAGNOSIS — Z992 Dependence on renal dialysis: Secondary | ICD-10-CM | POA: Diagnosis not present

## 2022-10-21 DIAGNOSIS — N2581 Secondary hyperparathyroidism of renal origin: Secondary | ICD-10-CM | POA: Diagnosis not present

## 2022-10-23 DIAGNOSIS — N186 End stage renal disease: Secondary | ICD-10-CM | POA: Diagnosis not present

## 2022-10-23 DIAGNOSIS — Z992 Dependence on renal dialysis: Secondary | ICD-10-CM | POA: Diagnosis not present

## 2022-10-23 DIAGNOSIS — N2581 Secondary hyperparathyroidism of renal origin: Secondary | ICD-10-CM | POA: Diagnosis not present

## 2022-10-26 DIAGNOSIS — N2581 Secondary hyperparathyroidism of renal origin: Secondary | ICD-10-CM | POA: Diagnosis not present

## 2022-10-26 DIAGNOSIS — Z992 Dependence on renal dialysis: Secondary | ICD-10-CM | POA: Diagnosis not present

## 2022-10-26 DIAGNOSIS — N186 End stage renal disease: Secondary | ICD-10-CM | POA: Diagnosis not present

## 2022-10-27 ENCOUNTER — Ambulatory Visit: Payer: Self-pay | Admitting: *Deleted

## 2022-10-27 NOTE — Patient Outreach (Signed)
  Care Coordination   Case closure   Visit Note   10/27/2022 Name: Kathryn Beck MRN: [id number] DOB: 10/07/1955  Kathryn Beck is a 67 y.o. year old female who sees [name], MD for primary care. I  updated by [facility] hospital liaison that patient was admitted ton [facility] skilled nursing facility 10/21/22  What matters to the patients health and wellness today?  To skilled nursing facility     Goals Addressed   None     SDOH assessments and interventions completed:  No     Care Coordination Interventions:  Yes, provided   Follow up plan: No further intervention required.   Encounter Outcome:  Pt. Visit Completed   L. Noelle Penner, RN, BSN, CCM [facility] Care Management Community Coordinator Office number [phone]

## 2022-10-28 DIAGNOSIS — N2581 Secondary hyperparathyroidism of renal origin: Secondary | ICD-10-CM | POA: Diagnosis not present

## 2022-10-28 DIAGNOSIS — N186 End stage renal disease: Secondary | ICD-10-CM | POA: Diagnosis not present

## 2022-10-28 DIAGNOSIS — Z992 Dependence on renal dialysis: Secondary | ICD-10-CM | POA: Diagnosis not present

## 2022-10-29 ENCOUNTER — Encounter: Payer: Self-pay | Admitting: Orthopedic Surgery

## 2022-10-29 ENCOUNTER — Ambulatory Visit (INDEPENDENT_AMBULATORY_CARE_PROVIDER_SITE_OTHER): Payer: Medicare PPO | Admitting: Orthopedic Surgery

## 2022-10-29 DIAGNOSIS — Z89512 Acquired absence of left leg below knee: Secondary | ICD-10-CM

## 2022-10-29 DIAGNOSIS — Z89511 Acquired absence of right leg below knee: Secondary | ICD-10-CM

## 2022-10-29 NOTE — Progress Notes (Signed)
Office Visit Note   Patient: Kathryn Beck           Date of Birth: 1955-11-07           MRN: [id number] Visit Date: 10/29/2022              Requested by: [name], MD [address] [place],  [place] [address] PCP: [name], MD  Chief Complaint  Patient presents with   Left Leg - Routine Post Op    10/07/2022 left BKA       HPI: Patient is a 67 year old woman who is 3 weeks status post left below-knee amputation.  She is currently working with Technical sales engineer for her right below-knee amputation prosthesis.  Assessment & Plan: Visit Diagnoses:  1. Right below-knee amputee (HCC)   2. Left below-knee amputee [facility])     Plan: Staples are harvested she will continue with her stump shrinker.  Follow-Up Instructions: Return in about 4 weeks (around 11/26/2022).   Ortho Exam  Patient is alert, oriented, no adenopathy, well-dressed, normal affect, normal respiratory effort. The surgical incision is healing well there is minimal swelling no redness no drainage.  Staples harvested.  Imaging: No results found. No images are attached to the encounter.  Labs: Lab Results  Component Value Date   HGBA1C 6.3 (H) 07/07/2022   HGBA1C 9.3 (A) 04/16/2022   HGBA1C 8.9 (A) 10/28/2021   ESRSEDRATE 70 (H) 09/03/2022   ESRSEDRATE 79 (H) 08/13/2022   ESRSEDRATE 37 (H) 04/25/2022   CRP 0.9 09/03/2022   CRP 9.1 (H) 08/13/2022   CRP 29.4 (H) 04/25/2022   LABURIC 1.8 (L) 06/07/2018   LABURIC 6.2 04/22/2018   LABURIC 9.3 (H) 10/07/2015   REPTSTATUS 09/09/2022 FINAL 09/04/2022   GRAMSTAIN  09/04/2022    RARE WBC PRESENT, PREDOMINANTLY PMN NO ORGANISMS SEEN    CULT  09/04/2022    RARE ENTEROBACTER HORMAECHEI NO ANAEROBES ISOLATED Performed at [facility], [address], [place], [place] [address]    LABORGA ENTEROBACTER HORMAECHEI 09/04/2022     Lab Results  Component Value Date   ALBUMIN 2.7 (L) 10/12/2022   ALBUMIN 3.0 (L) 10/09/2022   ALBUMIN  3.6 10/01/2022   PREALBUMIN 26 08/13/2022   PREALBUMIN 24 04/25/2022    Lab Results  Component Value Date   MG 1.9 09/08/2022   MG 2.3 09/07/2022   MG 1.9 09/06/2022   Lab Results  Component Value Date   VD25OH 30.9 09/28/2017   VD25OH 15 (L) 10/07/2015   VD25OH 16 (L) 04/25/2015    Lab Results  Component Value Date   PREALBUMIN 26 08/13/2022   PREALBUMIN 24 04/25/2022      Latest Ref Rng & Units 10/14/2022    1:16 AM 10/13/2022    1:56 AM 10/12/2022    4:08 AM  CBC EXTENDED  WBC 4.0 - 10.5 K/uL 7.6  7.6  6.6   RBC 3.87 - 5.11 MIL/uL 3.68  3.83  3.30   Hemoglobin 12.0 - 15.0 g/dL 9.1  9.4  8.0   HCT 82.9 - 46.0 % 29.4  31.1  26.5   Platelets 150 - 400 K/uL 273  266  241      There is no height or weight on file to calculate BMI.  Orders:  No orders of the defined types were placed in this encounter.  No orders of the defined types were placed in this encounter.    Procedures: No procedures performed  Clinical  Data: No additional findings.  ROS:  All other systems negative, except as noted in the HPI. Review of Systems  Objective: Vital Signs: There were no vitals taken for this visit.  Specialty Comments:  No specialty comments available.  PMFS History: Patient Active Problem List   Diagnosis Date Noted   Dehiscence of amputation stump of left lower extremity (HCC) 10/07/2022   S/P BKA (below knee amputation) unilateral, left (HCC) 10/07/2022   Subacute osteomyelitis, left ankle and foot (HCC) 09/03/2022   Left foot infection 09/03/2022   Status post transmetatarsal amputation of foot, left (HCC) 09/03/2022   Wound infection after surgery 09/03/2022   Equinus contracture of left ankle 08/15/2022   Acute osteomyelitis of toe of left foot (HCC) 08/13/2022   Peripheral vascular complication 07/21/2022   PVD (peripheral vascular disease) (HCC) 07/18/2022   Toe ulcer, left, limited to breakdown of skin (HCC) 07/13/2022   S/P BKA (below knee  amputation), right (HCC) 05/29/2022   Confusion 05/19/2022   Visual hallucinations 05/19/2022   Watery eyes 05/19/2022   Requires assistance with activities of daily living (ADL) 04/26/2022   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   Gangrene of right foot (HCC) 04/25/2022   PAD (peripheral artery disease) (HCC) 04/25/2022   Other osteoporosis without current pathological fracture 11/10/2021   Hypocalcemia 11/10/2021   Fall 11/02/2021   Left foot pain 10/31/2021   Low back pain with left-sided sciatica 10/31/2021   Left ear impacted cerumen 09/18/2021   Chronic left shoulder pain 06/10/2021   Abnormal CXR 03/10/2021   Ear pain, bilateral 03/10/2021   Morbid obesity (HCC) 03/10/2021   Subungual hematoma of second toe of left foot 10/28/2020   Insomnia 10/28/2020   Memory loss or impairment 09/26/2020   Forgetfulness 09/26/2020   Recurrent vertigo 09/26/2020   Abnormal MRI, cervical spine 03/04/2020   Right leg weakness 02/28/2020   Knee pain, right 10/23/2019   Cough 08/09/2019   Carpal tunnel syndrome 06/13/2019   Chronic pain syndrome 06/13/2019   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) 06/13/2019   Neck pain 03/23/2019   Near syncope 12/05/2018   ESRD on hemodialysis (HCC) 11/22/2018   Ischemic heart disease 09/25/2018   Not currently working due to disabled status 06/13/2018   HTN (hypertension) 06/12/2018   Depression 03/09/2018   Adrenal mass, left (HCC) 11/09/2017   MGUS (monoclonal gammopathy of unknown significance) 11/05/2017   Shoulder pain 11/01/2017   Chronic diastolic (congestive) heart failure (HCC) 09/20/2017   Bilateral leg weakness 09/09/2017   Adrenal adenoma 09/03/2017   Uncontrolled type 2 diabetes mellitus 08/18/2017   Unsteady gait 07/11/2017   Recurrent falls 07/11/2017   Anemia of chronic disease 03/24/2017   Personal history of colonic polyps 02/10/2017   Dysphagia 11/05/2016   Irritable bowel syndrome 02/04/2016    Hospital discharge follow-up 10/27/2015   Intolerant of heat 10/07/2014   Cardiac murmur 01/17/2014   Back pain with left-sided radiculopathy 09/18/2013   Seasonal allergies 03/10/2013   Lipoma of back 12/22/2012   Other seasonal allergic rhinitis 08/22/2012   OSA (obstructive sleep apnea) 06/02/2011   Chronic kidney disease, stage 4 (severe) (HCC) 01/13/2011   Neuropathy 04/16/2010   Malaise and fatigue 07/24/2009   Personal history of other diseases of the nervous system and sense organs 05/08/2009   LUPUS ERYTHEMATOSUS, DISCOID 06/05/2008   Arm pain 05/30/2008   Hyperlipidemia 06/07/2007   Obesity 06/07/2007   Malignant hypertension 06/07/2007   Type 2 diabetes mellitus with  hyperglycemia (HCC) 06/07/2007   Diabetes mellitus (HCC) 06/07/2007   Past Medical History:  Diagnosis Date   Acid reflux    Anemia    Arthritis    Axillary masses    Soft tissue - status post excision   Back pain    Cancer (HCC)    CHF (congestive heart failure) (HCC)    COVID-19 virus infection 04/06/2019   Depression    End-stage renal disease (HCC)    M/W/F dialysis   Essential hypertension    Headache    years ago   History of blood transfusion    History of cardiac catheterization    Normal coronary arteries October 2020   History of claustrophobia    History of pneumonia 2019   Hypoxia 04/03/2019   Memory loss    Mixed hyperlipidemia    Obesity    Pancreatitis    Peritoneal dialysis catheter in place [facility])    Pneumonia due to COVID-19 virus 04/02/2019   Sleep apnea    Noncompliant with CPAP   Stroke (HCC)    mini stroke   Type 2 diabetes mellitus (HCC)     Family History  Problem Relation Age of Onset   Hypertension Father    Hypercholesterolemia Father    Arthritis Father    Hypertension Sister    Hypercholesterolemia Sister    Breast cancer Sister    Hypertension Sister    Colon cancer Neg Hx    Colon polyps Neg Hx     Past Surgical History:  Procedure Laterality  Date   ABDOMINAL AORTOGRAM W/LOWER EXTREMITY N/A 04/30/2022   Procedure: ABDOMINAL AORTOGRAM W/LOWER EXTREMITY;  Surgeon: Leonie Douglas, MD;  Location: [facility] INVASIVE CV LAB;  Service: Cardiovascular;  Laterality: N/A;   ABDOMINAL AORTOGRAM W/LOWER EXTREMITY N/A 07/21/2022   Procedure: ABDOMINAL AORTOGRAM W/LOWER EXTREMITY;  Surgeon: Nada Libman, MD;  Location: [facility] LAB;  Service: Cardiovascular;  Laterality: N/A;   ABDOMINAL HYSTERECTOMY     ACHILLES TENDON LENGTHENING  08/15/2022   Procedure: ACHILLES TENDON LENGTHENING;  Surgeon: [name], DPM;  Location: [facility] OR;  Service: Podiatry;;   AMPUTATION Right 05/29/2022   Procedure: RIGHT BELOW THE KNEE AMPUTATION;  Surgeon: [name], MD;  Location[facility] OR;  Service: Orthopedics;  Laterality: Right;   AMPUTATION Left 09/04/2022   Procedure: AMPUTATION FOOT, serial irrigation;  Surgeon: Louann Sjogren, DPM;  Location: [facility] OR;  Service: Podiatry;  Laterality: Left;  Surgical team to do block   AMPUTATION Left 10/07/2022   Procedure: LEFT BELOW KNEE AMPUTATION;  Surgeon: [name], MD;  Location: [facility] OR;  Service: Orthopedics;  Laterality: Left;   AV FISTULA PLACEMENT Left 09/02/2017   Procedure: creation of left arm ARTERIOVENOUS (AV) FISTULA;  Surgeon: Nada Libman, MD;  Location: [facility] OR;  Service: Vascular;  Laterality: Left;   COLONOSCOPY  2008   Dr. Darrick Penna: normal    COLONOSCOPY N/A 12/18/2016   Dr. Darrick Penna: multiple tubular adenomas, internal hemorrhoids. Surveillance in 3 years    ESOPHAGEAL DILATION N/A 10/13/2015   Procedure: ESOPHAGEAL DILATION;  Surgeon: Malissa Hippo, MD;  Location: [facility];  Service: Endoscopy;  Laterality: N/A;   ESOPHAGOGASTRODUODENOSCOPY N/A 10/13/2015   Dr. Karilyn Cota: chronic gastritis on path, no H.pylori. Empiric dilation    ESOPHAGOGASTRODUODENOSCOPY N/A 12/18/2016   Dr. Darrick Penna: mild gastritis. BRAVO study revealed uncontrolled GERD. Dysphagia secondary to uncontrolled reflux   FOOT  SURGERY Bilateral    "nerve"     LEFT HEART  CATH AND CORONARY ANGIOGRAPHY N/A 12/29/2018   Procedure: LEFT HEART CATH AND CORONARY ANGIOGRAPHY;  Surgeon: Corky Crafts, MD;  Location: [facility] INVASIVE CV LAB;  Service: Cardiovascular;  Laterality: N/A;   LOWER EXTREMITY ANGIOGRAPHY Right 05/04/2022   Procedure: Lower Extremity Angiography;  Surgeon: Victorino Sparrow, MD;  Location: [facility] INVASIVE CV LAB;  Service: Cardiovascular;  Laterality: Right;   LUNG BIOPSY     MASS EXCISION Right 01/09/2013   Procedure: EXCISION OF NEOPLASM OF RIGHT  AXILLA  AND EXCISION OF NEOPLASM OF LEFT AXILLA;  Surgeon: Dalia Heading, MD;  Location: [facility] ORS;  Service: General;  Laterality: Right;  procedure end @ 08:23   MYRINGOTOMY WITH TUBE PLACEMENT Bilateral 04/28/2017   Procedure: BILATERAL MYRINGOTOMY WITH TUBE PLACEMENT;  Surgeon: Newman Pies, MD;  Location: [facility] OR;  Service: ENT;  Laterality: Bilateral;   PERIPHERAL VASCULAR BALLOON ANGIOPLASTY Right 05/04/2022   Procedure: PERIPHERAL VASCULAR BALLOON ANGIOPLASTY;  Surgeon: Victorino Sparrow, MD;  Location: [facility] INVASIVE CV LAB;  Service: Cardiovascular;  Laterality: Right;  PT   PERIPHERAL VASCULAR INTERVENTION Right 05/04/2022   Procedure: PERIPHERAL VASCULAR INTERVENTION;  Surgeon: Victorino Sparrow, MD;  Location: [facility] INVASIVE CV LAB;  Service: Cardiovascular;  Laterality: Right;  SFA   PERIPHERAL VASCULAR INTERVENTION Left 07/21/2022   Procedure: PERIPHERAL VASCULAR INTERVENTION;  Surgeon: Nada Libman, MD;  [facility];  Service: Cardiovascular;  Laterality: Left;   REVISION OF ARTERIOVENOUS GORETEX GRAFT Left 05/04/2018   Procedure: TRANSPOSITION OF CEPHALIC VEIN ARTERIOVENOUS FISTULA LEFT ARM;  Surgeon: Larina Earthly, MD;  Location: [facility] OR;  Service: Vascular;  Laterality: Left;   SAVORY DILATION N/A 12/18/2016   Procedure: SAVORY DILATION;  Surgeon: West Bali, MD;  [facility];  Service: Endoscopy;  Laterality: N/A;    TRANSMETATARSAL AMPUTATION Left 08/15/2022   Procedure: TRANSMETATARSAL AMPUTATION;  Surgeon: [name], DPM;  Location: [facility] OR;  Service: Podiatry;  Laterality: Left;   Social History   Occupational History   Occupation: retired   Tobacco Use   Smoking status: Never    Passive exposure: Never   Smokeless tobacco: Never   Tobacco comments:    Verified by Daughter, [name]  Vaping Use   Vaping status: Never Used  Substance and Sexual Activity   Alcohol use: No   Drug use: No   Sexual activity: Not Currently    Partners: Male

## 2022-10-30 DIAGNOSIS — Z992 Dependence on renal dialysis: Secondary | ICD-10-CM | POA: Diagnosis not present

## 2022-10-30 DIAGNOSIS — N186 End stage renal disease: Secondary | ICD-10-CM | POA: Diagnosis not present

## 2022-10-30 DIAGNOSIS — N2581 Secondary hyperparathyroidism of renal origin: Secondary | ICD-10-CM | POA: Diagnosis not present

## 2022-11-02 ENCOUNTER — Encounter: Payer: Self-pay | Admitting: Family Medicine

## 2022-11-02 ENCOUNTER — Telehealth: Payer: Self-pay

## 2022-11-02 DIAGNOSIS — N186 End stage renal disease: Secondary | ICD-10-CM | POA: Diagnosis not present

## 2022-11-02 DIAGNOSIS — N2581 Secondary hyperparathyroidism of renal origin: Secondary | ICD-10-CM | POA: Diagnosis not present

## 2022-11-02 DIAGNOSIS — Z992 Dependence on renal dialysis: Secondary | ICD-10-CM | POA: Diagnosis not present

## 2022-11-02 NOTE — Transitions of Care (Post Inpatient/ED Visit) (Signed)
11/02/2022  Name: Kathryn Beck MRN: [id number] DOB: 1956-03-01  Today's TOC FU Call Status: Today's TOC FU Call Status:: Successful TOC FU Call Completed TOC FU Call Complete Date: 11/02/22  Transition Care Management Follow-up Telephone Call Date of Discharge: 10/31/22 Discharge Facility: Other ([facility]) Name of Other ([facility]) Discharge Facility: [facility] Type of Discharge: Inpatient Admission Primary Inpatient Discharge Diagnosis:: S/P BKA (below knee amputation) unilateral, left How have you been since you were released from the hospital?: Better (Spoke with patients daughter who was with patient.  She had dialysis today and was resting) Any questions or concerns?: No  Items Reviewed: Did you receive and understand the discharge instructions provided?: Yes Medications obtained,verified, and reconciled?: Yes (Medications Reviewed) Any new allergies since your discharge?: No Dietary orders reviewed?: No Do you have support at home?: Yes People in Home: spouse Name of Support/Comfort Primary Source: Peyton Najjar, spouse and daughter  Medications Reviewed Today: Medications Reviewed Today     Reviewed by Jodelle Gross, RN (Case Manager) on 11/02/22 at 1414  Med List Status: <None>   Medication Order Taking? Sig Documenting Provider Last Dose Status Informant  aspirin 81 MG chewable tablet [id number] Yes CHEW TWO TABLETS BY MOUTH EVERY DAY  Patient taking differently: Chew 162 mg by mouth daily.   [name], MD Taking Active Child  azelastine (OPTIVAR) 0.05 % ophthalmic solution [id number] Yes Place 1 drop into both eyes 2 (two) times daily.  Patient taking differently: Place 1 drop into both eyes 2 (two) times daily as needed (allergies).   [name], MD Taking Active Child  B Complex-C-Folic Acid (RENA-VITE RX) 1 MG TABS [id number] No Take 1 tablet by mouth daily.  Patient not taking: Reported on 11/02/2022   [provider] Not  Taking Active Child           Med Note Electa Sniff [facility]   Mon Nov 02, 2022  2:06 PM) Med was not on post rehab med list  Blood Glucose Monitoring Suppl [facility] VERIO) w/Device KIT [id number] Yes Use to check blood sugar 4X daily. Carlus Pavlov, MD Taking Active Child  calcitRIOL (ROCALTROL) 0.25 MCG capsule [id number] No TAKE 1 CAPSULE (0.25 MCG TOTAL) BY MOUTH 2 (TWO) TIMES DAILY WITH A MEAL.  Patient not taking: Reported on 11/02/2022   Roma Kayser, MD Not Taking Active Child           Med Note Electa Sniff [facility]   Mon Nov 02, 2022  2:07 PM) Med was not on post rehab med list  cinacalcet (SENSIPAR) 30 MG tablet [id number] No Take 3 tablets (90 mg total) by mouth 3 (three) times a week.  Patient not taking: Reported on 11/02/2022   Merlene Laughter, DO Not Taking Active Child           Med Note Electa Sniff [facility]   Mon Nov 02, 2022  2:08 PM) Med not on post rehab med list  Continuous Blood Gluc Receiver (FREESTYLE LIBRE 2 READER) DEVI [id number] Yes 1 each by Does not apply route daily. Carlus Pavlov, MD Taking Active Child  Continuous Blood Gluc Sensor (FREESTYLE LIBRE 2 SENSOR) [place] [id number] Yes 1 each by Does not apply route every 14 (fourteen) days. Carlus Pavlov, MD Taking Active Child  DULoxetine (CYMBALTA) 60 MG capsule [id number] Yes TAKE (1) CAPSULE BY MOUTH ONCE DAILY.  Patient taking differently: Take 60 mg by mouth daily.   [name], MD Taking Active Child  ezetimibe (ZETIA) 10 MG tablet [id number]  Yes Take 1 tablet (10 mg total) by mouth daily. [name], MD Taking Active Child  FIASP FLEXTOUCH 100 UNIT/ML FlexTouch Pen [id number] Yes INJECT 18 TO 30 UNITS INTO THE SKIN WITH BREAKFAST, WITH LUNCH, AND WITH EVENING MEAL Carlus Pavlov, MD Taking Active     Discontinued 05/28/11 1302 (Patient has not taken in last 30 days)   furosemide (LASIX) 80 MG tablet [id number] No Take 80 mg by mouth 2 (two) times daily.  Patient not taking: Reported  on 11/02/2022   [provider] Not Taking Active Child           Med Note Electa Sniff [facility]   Mon Nov 02, 2022  2:10 PM) Med was not on rehab dc orders    Discontinued 05/28/11 1302 (Change in therapy)   glucose blood (ONETOUCH VERIO) test strip [id number] Yes Use as instructed to check blood sugar 4X daily. Carlus Pavlov, MD Taking Active Child  hydrOXYzine (ATARAX) 25 MG tablet [id number] Yes Take 25 mg by mouth every 12 (twelve) hours as needed for itching. [provider] Taking Active Child  insulin aspart (NOVOLOG) 100 UNIT/ML injection [id number] Yes Inject 1-10 Units into the skin 3 (three) times daily before meals. Sliding scale 101-150=1 units 151-200=2 units 201-250=4 units 251-300=6 units 301-350=8 units 351-450=10 units Above 450 call MD [provider] Taking Active Child           Med Note Mattie Marlin, Dinah Beers E   Wed Oct 07, 2022 12:14 PM) 4 units   insulin glargine, 2 Unit Dial, (TOUJEO MAX SOLOSTAR) 300 UNIT/ML Solostar Pen [id number] Yes Inject 30 Units into the skin daily. May need to slowly increase the dose depending upon your blood sugar, follow-up with PCP Lanae Boast, MD Taking Active Child           Med Note Alger Simons   Wed Oct 07, 2022 12:15 PM) 15 units   Insulin Pen Needle 32G X 4 MM MISC [id number] Yes Use 4x a day Carlus Pavlov, MD Taking Active Child  metoprolol succinate (TOPROL-XL) 50 MG 24 hr tablet [id number] Yes TAKE (1) TABLET BY MOUTH DAILY WITH FOOD *TAKE AFTER DIALYSIS*  Patient taking differently: Take 50 mg by mouth See admin instructions. Take Tues, thurs, sat & sunday   [name], MD Taking Active Child           Med Note Haywood Lasso   [facility] Jul 18, 2022  3:34 PM)    Dola Argyle Lancets 33G MISC [id number]  Use to check blood sugar 4X daily. Carlus Pavlov, MD  Active Child  oxyCODONE-acetaminophen (PERCOCET/ROXICET) 5-325 MG tablet [id number] Yes Take 1 tablet by mouth every 4 (four) hours as  needed. [name], MD Taking Active   pregabalin (LYRICA) 50 MG capsule [id number] Yes TAKE ONE CAPSULE BY MOUTH three times a day Vickki Hearing, MD Taking Active Child  sertraline (ZOLOFT) 50 MG tablet [id number] Yes TAKE ONE TABLET BY MOUTH EVERY DAY  Patient taking differently: Take 100 mg by mouth daily.   [name], MD Taking Active Child  torsemide [facility]) 100 MG tablet [id number] No TAKE 1 TABLET BY MOUTH TWICE DAILY *HOLD MORNING DOSE ON DIALYSIS DAYS*  Patient not taking: Reported on 11/02/2022   [name], MD Not Taking Active Child           Med Note Electa Sniff [facility]   Mon Nov 02, 2022  2:13 PM)    torsemide (DEMADEX) 100 MG tablet [id number]  Yes Take 100 mg by mouth 2 (two) times daily. [provider] Taking Active   [name] 500 MG chewable tablet [id number]  Chew 500-1,000 mg by mouth See admin instructions. Take 1000mg  (2 tablets) by mouth with meals and 500mg  (1 tablet) with snacks [provider]  Active Child  Med List Note Rolene Course 07/18/22 1635): Patient has dialysis on MWF            Home Care and Equipment/Supplies: Were Home Health Services Ordered?: Yes Name of Home Health Agency:: [facility] Has Agency set up a time to come to your home?: No EMR reviewed for Home Health Orders:  (This Clinical research associate called [name], spoke to [place] and order received this morning and they are waiting for insurance approval then will call patient.) Any new equipment or medical supplies ordered?: No  Functional Questionnaire: Do you need assistance with bathing/showering or dressing?: Yes Do you need assistance with meal preparation?: No Do you need assistance with eating?: No Do you have difficulty maintaining continence: No Do you need assistance with getting out of bed/getting out of a chair/moving?: No Do you have difficulty managing or taking your medications?: No  Follow up appointments reviewed: PCP Follow-up appointment  confirmed?: Yes Date of PCP follow-up appointment?: 11/12/22 Follow-up Provider: Dr. Allena Katz Specialist [facility] Follow-up appointment confirmed?: Yes Date of Specialist follow-up appointment?: 11/26/22 Follow-Up Specialty Provider:: Dr. Lajoyce Corners Do you need transportation to your follow-up appointment?: No Do you understand care options if your condition(s) worsen?: Yes-patient verbalized understanding  SDOH Interventions Today    Flowsheet Row Most Recent Value  SDOH Interventions   Food Insecurity Interventions Intervention Not Indicated  Housing Interventions Intervention Not Indicated      TOC Interventions Today    Flowsheet Row Most Recent Value  TOC Interventions   TOC Interventions Discussed/Reviewed TOC Interventions Discussed, TOC Interventions Reviewed, Contacted Home Health RN/OT/PT       TOC Interventions Today    Flowsheet Row Most Recent Value  TOC Interventions   TOC Interventions Discussed/Reviewed TOC Interventions Discussed, TOC Interventions Reviewed, Contacted Home Health RN/OT/PT

## 2022-11-04 DIAGNOSIS — N186 End stage renal disease: Secondary | ICD-10-CM | POA: Diagnosis not present

## 2022-11-04 DIAGNOSIS — Z992 Dependence on renal dialysis: Secondary | ICD-10-CM | POA: Diagnosis not present

## 2022-11-04 DIAGNOSIS — N2581 Secondary hyperparathyroidism of renal origin: Secondary | ICD-10-CM | POA: Diagnosis not present

## 2022-11-06 ENCOUNTER — Ambulatory Visit: Payer: Medicare PPO | Admitting: Family Medicine

## 2022-11-06 DIAGNOSIS — Z992 Dependence on renal dialysis: Secondary | ICD-10-CM | POA: Diagnosis not present

## 2022-11-06 DIAGNOSIS — N2581 Secondary hyperparathyroidism of renal origin: Secondary | ICD-10-CM | POA: Diagnosis not present

## 2022-11-06 DIAGNOSIS — N186 End stage renal disease: Secondary | ICD-10-CM | POA: Diagnosis not present

## 2022-11-09 ENCOUNTER — Encounter: Payer: Self-pay | Admitting: *Deleted

## 2022-11-09 ENCOUNTER — Ambulatory Visit: Payer: Self-pay | Admitting: *Deleted

## 2022-11-09 DIAGNOSIS — N186 End stage renal disease: Secondary | ICD-10-CM | POA: Diagnosis not present

## 2022-11-09 DIAGNOSIS — Z992 Dependence on renal dialysis: Secondary | ICD-10-CM | POA: Diagnosis not present

## 2022-11-09 DIAGNOSIS — N2581 Secondary hyperparathyroidism of renal origin: Secondary | ICD-10-CM | POA: Diagnosis not present

## 2022-11-09 NOTE — Patient Outreach (Signed)
Care Coordination   Follow Up Visit Note   11/09/2022  Name: Kathryn Beck MRN: [id number] DOB: 17-Apr-1955  Kathryn Beck is a 67 y.o. year old female who sees [name], MD for primary care. I spoke with patient's daughter, [name] by phone today.  What matters to the patients health and wellness today?  Receive Assistance Applying for Aid & Attendance Benefits, through [employer].   Goals Addressed             This Visit's Progress    Receive Assistance Applying for Aid & Attendance Benefits, through [employer].   On track    Care Coordination Interventions:  Interventions Today    Flowsheet Row Most Recent Value  Chronic Disease   Chronic disease during today's visit Diabetes, Hypertension (HTN), Chronic Kidney Disease/End Stage Renal Disease (ESRD), Congestive Heart Failure (CHF), Other  [Neuropathy, Chronic Pain Syndrome, Anxiety, Depression, Memory Loss, Neurocognitive Disorder & Requires Maximum Assistance with Activities of Daily Living]  General Interventions   General Interventions Discussed/Reviewed General Interventions Reviewed, Annual Eye Exam, General Interventions Discussed, Labs, Annual Foot Exam, Lipid Profile, Durable Medical Equipment (DME), Vaccines, Health Screening, [employer], Doctor Visits, Communication with, Level of Care  [Primary Care Provider]  Labs Hgb A1c every 3 months, Kidney Function  Vaccines COVID-19, Flu, Pneumonia, RSV, Shingles, Tetanus/Pertussis/Diphtheria  [Encouraged]  Doctor Visits Discussed/Reviewed Doctor Visits Discussed, Doctor Visits Reviewed, Annual Wellness Visits, PCP, Specialist  [Encouraged]  Health Screening Colonoscopy, Mammogram  [Encouraged]  Durable Medical Equipment (DME) BP Cuff, Glucomoter, Environmental consultant, Wheelchair, Physicist, medical  PCP/Specialist Visits Compliance with follow-up visit  Communication with PCP/Specialists, RN  Level of Care Adult  Daycare, Air traffic controller, Assisted Living, Skilled Nursing Facility, Teaching laboratory technician Medicaid, Personal Care Services  Exercise Interventions   Exercise Discussed/Reviewed Exercise Discussed, Exercise Reviewed, Physical Activity, Assistive device use and maintanence  Physical Activity Discussed/Reviewed Physical Activity Discussed, Physical Activity Reviewed, Types of exercise, Home Exercise Program (HEP)  [Encouraged]  Education Interventions   Education Provided Provided Therapist, sports, Provided Web-based Education, Provided Education  Provided Verbal Education On Nutrition, Foot Care, Eye Care, Labs, Blood Sugar Monitoring, Mental Health/Coping with Illness, Applications, Exercise, Medication, When to see the doctor, [employer], Human resources officer, Personal Care Services  Mental Health Interventions   Mental Health Discussed/Reviewed Mental Health Discussed, Mental Health Reviewed, Coping Strategies, Crisis, Anxiety, Depression, Grief and Loss, Substance Abuse, Suicide  Nutrition Interventions   Nutrition Discussed/Reviewed Nutrition Discussed, Nutrition Reviewed, Adding fruits and vegetables, Fluid intake, Decreasing sugar intake, Increaing proteins, Decreasing fats, Decreasing salt  Pharmacy Interventions   Pharmacy Dicussed/Reviewed Pharmacy Topics Discussed, Pharmacy Topics Reviewed, Medication Adherence, Affording Medications, Referral to Pharmacist  Medication Adherence Unable to refill medication  Referral to Pharmacist Cannot afford medications  Safety Interventions   Safety Discussed/Reviewed Safety Discussed, Safety Reviewed, Fall Risk, Home Safety  Home Safety Assistive Devices, Need for home safety assessment, Refer for home visit, Contact provider for referral to PT/OT, Refer for community resources, Contact home health agency  Advanced Directive Interventions   Advanced Directives Discussed/Reviewed Advanced Directives Discussed,  Advanced Directives Reviewed, Advanced Care Planning, Provided resource for acquiring and filling out documents      Active Listening & Reflection Utilized.  Emotional Support Provided. Verbalization of Feelings Encouraged.  Solution-Focused Strategies Indicated. Problem Solving Interventions Implemented. Task-Centered Solutions Activated.  Cognitive Behavioral Therapy Performed.  Client-Centered Therapy Performed Initiated. Acceptance & Commitment Therapy Employed. CSW Collaboration with Daughter, [name]  to Confirm Patient's Discharge from [facility] [phone]) on 10/31/2022, Where She Was Residing to Receive Short-Term Rehabilitative Services.  CSW Collaboration with Daughter, [name] to [facility] Skilled Nursing, Physical Therapy, & Occupational Therapy Services in Place, through [facility] [phone]). CSW Collaboration with Dr. Syliva Overman, Primary Care Provider with [facility] [phone]), Via Secure Chat Message in Epic, to Request Written Order for Home Health Bath Aide Be Faxed to [facility] [phone]), Per Daughter's Request. CSW Collaboration with Daughter, [name] to Encourage Contact with Representative from [facility] [phone]), to Periodically Check Status of Application for Aid & Attendance Benefits.  CSW Collaboration with Daughter, [name] to [employer] with CSW [phone]), if She Has Questions, Needs Assistance, or If Additional Social Work Needs Are Identified Between Now & Our Next Scheduled Follow-Up Outreach Call.      SDOH assessments and interventions completed:  Yes.  Care Coordination Interventions:  Yes, provided.   Follow up plan: Follow up call scheduled for 11/27/2022 at 10:00 am.  Encounter Outcome:  Pt. Visit Completed.   Danford Bad, BSW, MSW, LCSW   Licensed Restaurant manager, [employer] [facility]  Mailing [place] [address], [place], [place] [address] Physical Address-[address] [place], [place] [address] Toll Free Main # [phone] Fax # [phone] Cell # [phone] [email or web address]

## 2022-11-09 NOTE — Patient Instructions (Signed)
Visit Information  Thank you for taking time to visit with me today. Please don't hesitate to contact me if I can be of assistance to you.   Following are the goals we discussed today:   Goals Addressed             This Visit's Progress    Receive Assistance Applying for Aid & Attendance Benefits, through [employer].   On track    Care Coordination Interventions:  Interventions Today    Flowsheet Row Most Recent Value  Chronic Disease   Chronic disease during today's visit Diabetes, Hypertension (HTN), Chronic Kidney Disease/End Stage Renal Disease (ESRD), Congestive Heart Failure (CHF), Other  [Neuropathy, Chronic Pain Syndrome, Anxiety, Depression, Memory Loss, Neurocognitive Disorder & Requires Maximum Assistance with Activities of Daily Living]  General Interventions   General Interventions Discussed/Reviewed General Interventions Reviewed, Annual Eye Exam, General Interventions Discussed, Labs, Annual Foot Exam, Lipid Profile, Durable Medical Equipment (DME), Vaccines, Health Screening, [employer], Doctor Visits, Communication with, Level of Care  [Primary Care Provider]  Labs Hgb A1c every 3 months, Kidney Function  Vaccines COVID-19, Flu, Pneumonia, RSV, Shingles, Tetanus/Pertussis/Diphtheria  [Encouraged]  Doctor Visits Discussed/Reviewed Doctor Visits Discussed, Doctor Visits Reviewed, Annual Wellness Visits, PCP, Specialist  [Encouraged]  Health Screening Colonoscopy, Mammogram  [Encouraged]  Durable Medical Equipment (DME) BP Cuff, Glucomoter, Environmental consultant, Wheelchair, Physicist, medical  PCP/Specialist Visits Compliance with follow-up visit  Communication with PCP/Specialists, RN  Level of Care Adult Daycare, Air traffic controller, Assisted Living, Skilled Nursing Facility, Teaching laboratory technician Medicaid, Personal Care Services  Exercise Interventions   Exercise Discussed/Reviewed Exercise Discussed, Exercise Reviewed, Physical  Activity, Assistive device use and maintanence  Physical Activity Discussed/Reviewed Physical Activity Discussed, Physical Activity Reviewed, Types of exercise, Home Exercise Program (HEP)  [Encouraged]  Education Interventions   Education Provided Provided Therapist, sports, Provided Web-based Education, Provided Education  Provided Verbal Education On Nutrition, Foot Care, Eye Care, Labs, Blood Sugar Monitoring, Mental Health/Coping with Illness, Applications, Exercise, Medication, When to see the doctor, [employer], Human resources officer, Personal Care Services  Mental Health Interventions   Mental Health Discussed/Reviewed Mental Health Discussed, Mental Health Reviewed, Coping Strategies, Crisis, Anxiety, Depression, Grief and Loss, Substance Abuse, Suicide  Nutrition Interventions   Nutrition Discussed/Reviewed Nutrition Discussed, Nutrition Reviewed, Adding fruits and vegetables, Fluid intake, Decreasing sugar intake, Increaing proteins, Decreasing fats, Decreasing salt  Pharmacy Interventions   Pharmacy Dicussed/Reviewed Pharmacy Topics Discussed, Pharmacy Topics Reviewed, Medication Adherence, Affording Medications, Referral to Pharmacist  Medication Adherence Unable to refill medication  Referral to Pharmacist Cannot afford medications  Safety Interventions   Safety Discussed/Reviewed Safety Discussed, Safety Reviewed, Fall Risk, Home Safety  Home Safety Assistive Devices, Need for home safety assessment, Refer for home visit, Contact provider for referral to PT/OT, Refer for community resources, Contact home health agency  Advanced Directive Interventions   Advanced Directives Discussed/Reviewed Advanced Directives Discussed, Advanced Directives Reviewed, Advanced Care Planning, Provided resource for acquiring and filling out documents      Active Listening & Reflection Utilized.  Emotional Support Provided. Verbalization of Feelings Encouraged.   Solution-Focused Strategies Indicated. Problem Solving Interventions Implemented. Task-Centered Solutions Activated.  Cognitive Behavioral Therapy Performed.  Client-Centered Therapy Performed Initiated. Acceptance & Commitment Therapy Employed. CSW Collaboration with Daughter, [name] to Confirm Patient's Discharge from [facility] [phone]) on 10/31/2022, Where She Was Residing to Receive Short-Term Rehabilitative Services.  CSW Collaboration with Daughter, [name] to [facility]  Skilled Nursing, Physical Therapy, & Occupational Therapy Services in Place, through [facility] [phone]). CSW Collaboration with Dr. Syliva Overman, Primary Care Provider with [facility] [phone]), Via Secure Chat Message in Epic, to Request Written Order for Home Health Bath Aide Be Faxed to [facility] [phone]), Per Daughter's Request. CSW Collaboration with Daughter, [name] to Encourage Contact with Representative from [facility] [phone]), to Periodically Check Status of Application for Aid & Attendance Benefits.  CSW Collaboration with Daughter, [name] to [employer] with CSW [phone]), if She Has Questions, Needs Assistance, or If Additional Social Work Needs Are Identified Between Now & Our Next Scheduled Follow-Up Outreach Call.      Our next appointment is by telephone on 11/27/2022 at 10:00 am.  Please call the care guide team at [phone] if you need to cancel or reschedule your appointment.   If you are experiencing a Mental Health or Behavioral Health Crisis or need someone to talk to, please call the Suicide and Crisis Lifeline: 988 call the [place] National Suicide Prevention Lifeline: 267-310-5950 or TTY: (831)145-7741 TTY 8438394345) to talk to a trained counselor call 1-800-273-TALK (toll free, 24  hour hotline) go to [facility] [address], [place] [phone]) call the [facility] Crisis Line: [phone] call 911  Patient verbalizes understanding of instructions and care plan provided today and agrees to view in MyChart. Active MyChart status and patient understanding of how to access instructions and care plan via MyChart confirmed with patient.     Telephone follow up appointment with care management team member scheduled for:  11/27/2022 at 10:00 am.  Danford Bad, BSW, MSW, LCSW  Licensed Clinical Social Worker  [employer] [facility]  Mailing [place]. [address] [place], [place] [address] Physical Address-[address] [place], [place] [address] Toll Free Main # [phone] Fax # [phone] Cell # [phone] [email or web address]

## 2022-11-10 ENCOUNTER — Other Ambulatory Visit: Payer: Self-pay

## 2022-11-10 ENCOUNTER — Encounter: Payer: Self-pay | Admitting: Family Medicine

## 2022-11-10 ENCOUNTER — Telehealth: Payer: Self-pay | Admitting: Family Medicine

## 2022-11-10 NOTE — Telephone Encounter (Signed)
Pls order bath aide per daughter's request thru [name], contact her to get clear understanding of what she wants before ordering pls Thanks

## 2022-11-11 DIAGNOSIS — N2581 Secondary hyperparathyroidism of renal origin: Secondary | ICD-10-CM | POA: Diagnosis not present

## 2022-11-11 DIAGNOSIS — Z992 Dependence on renal dialysis: Secondary | ICD-10-CM | POA: Diagnosis not present

## 2022-11-11 DIAGNOSIS — N186 End stage renal disease: Secondary | ICD-10-CM | POA: Diagnosis not present

## 2022-11-11 NOTE — Telephone Encounter (Signed)
Patients daughter aware

## 2022-11-12 ENCOUNTER — Telehealth: Payer: Self-pay | Admitting: Orthopedic Surgery

## 2022-11-12 ENCOUNTER — Encounter: Payer: Self-pay | Admitting: Internal Medicine

## 2022-11-12 ENCOUNTER — Ambulatory Visit (INDEPENDENT_AMBULATORY_CARE_PROVIDER_SITE_OTHER): Payer: Medicare PPO | Admitting: Internal Medicine

## 2022-11-12 VITALS — BP 158/78 | HR 99

## 2022-11-12 DIAGNOSIS — M62522 Muscle wasting and atrophy, not elsewhere classified, left upper arm: Secondary | ICD-10-CM | POA: Diagnosis not present

## 2022-11-12 DIAGNOSIS — E1151 Type 2 diabetes mellitus with diabetic peripheral angiopathy without gangrene: Secondary | ICD-10-CM | POA: Diagnosis not present

## 2022-11-12 DIAGNOSIS — E1122 Type 2 diabetes mellitus with diabetic chronic kidney disease: Secondary | ICD-10-CM | POA: Diagnosis not present

## 2022-11-12 DIAGNOSIS — Z09 Encounter for follow-up examination after completed treatment for conditions other than malignant neoplasm: Secondary | ICD-10-CM

## 2022-11-12 DIAGNOSIS — L03011 Cellulitis of right finger: Secondary | ICD-10-CM

## 2022-11-12 DIAGNOSIS — I5032 Chronic diastolic (congestive) heart failure: Secondary | ICD-10-CM | POA: Diagnosis not present

## 2022-11-12 DIAGNOSIS — M62521 Muscle wasting and atrophy, not elsewhere classified, right upper arm: Secondary | ICD-10-CM | POA: Diagnosis not present

## 2022-11-12 DIAGNOSIS — Z89512 Acquired absence of left leg below knee: Secondary | ICD-10-CM

## 2022-11-12 DIAGNOSIS — I10 Essential (primary) hypertension: Secondary | ICD-10-CM | POA: Diagnosis not present

## 2022-11-12 DIAGNOSIS — Z4781 Encounter for orthopedic aftercare following surgical amputation: Secondary | ICD-10-CM | POA: Diagnosis not present

## 2022-11-12 DIAGNOSIS — I132 Hypertensive heart and chronic kidney disease with heart failure and with stage 5 chronic kidney disease, or end stage renal disease: Secondary | ICD-10-CM | POA: Diagnosis not present

## 2022-11-12 DIAGNOSIS — D631 Anemia in chronic kidney disease: Secondary | ICD-10-CM | POA: Diagnosis not present

## 2022-11-12 DIAGNOSIS — N186 End stage renal disease: Secondary | ICD-10-CM | POA: Diagnosis not present

## 2022-11-12 MED ORDER — AMLODIPINE BESYLATE 5 MG PO TABS: 5.0000 mg | ORAL_TABLET | Freq: Every day | ORAL | 3 refills | Status: DC

## 2022-11-12 MED ORDER — MUPIROCIN 2 % EX OINT
1.0000 | TOPICAL_OINTMENT | Freq: Two times a day (BID) | CUTANEOUS | 0 refills | Status: DC
Start: 2022-11-12 — End: 2022-11-19

## 2022-11-12 NOTE — Telephone Encounter (Signed)
I called and sw Kathryn Beck and advised as per the last visit 10/29/2022 pt is to wash daily and wear shrinker. Pt has follow up on 11/26/2022 and to call if anything changes.

## 2022-11-12 NOTE — Progress Notes (Signed)
Established Patient Office Visit  Subjective:  Patient ID: Kathryn Beck, female    DOB: [birth year]  Age: 67 y.o. MRN: [id number]  CC:  Chief Complaint  Patient presents with   Hospitalization Follow-up    Hospital follow up DC on 10/31/22   Hand Pain    Right hand finger swollen and blister     HPI Kathryn Beck is a 67 y.o. female with past medical history of HTN, type II DM and PVD who presents for follow-up after recent hospitalization.  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  She was discharged from [facility] on 08/17.  She has completed oral antibiotics.  Denies any fever or chills currently.  She is still getting wound care through [employer] home health.  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  Her BP was elevated today.  She is currently taking Torsemide 100 mg twice daily (managed by nephrology) and metoprolol 50 mg QD on nondialysis days.  She has had elevated BP readings since stopping amlodipine and clonidine earlier this year as she was having hypotensive episodes with HD.  She denies any headache, dizziness, chest pain, dyspnea or palpitations currently.  She had a blister on right middle finger near the nail, which has turned into dark brown discoloration.  She had pain initially, which has slightly improved now.  Denies any active bleeding or discharge currently.  Past Medical History:  Diagnosis Date   Acid reflux    Amputated left leg (HCC)    Anemia    Arthritis    Axillary masses    Soft tissue - status post excision   Back pain    Cancer (HCC)    CHF (congestive heart failure) (HCC)    COVID-19 virus infection 04/06/2019   Depression    End-stage renal disease (HCC)    M/W/F dialysis   Essential hypertension    Headache    years ago   History of blood transfusion    History of cardiac catheterization    Normal coronary arteries October 2020   History of  claustrophobia    History of pneumonia 2019   Hypoxia 04/03/2019   Memory loss    Mixed hyperlipidemia    Obesity    Pancreatitis    Peritoneal dialysis catheter in place [facility])    Pneumonia due to COVID-19 virus 04/02/2019   Sleep apnea    Noncompliant with CPAP   Stroke (HCC)    mini stroke   Type 2 diabetes mellitus (HCC)     Past Surgical History:  Procedure Laterality Date   ABDOMINAL AORTOGRAM W/LOWER EXTREMITY N/A 04/30/2022   Procedure: ABDOMINAL AORTOGRAM W/LOWER EXTREMITY;  Surgeon: Leonie Douglas, MD;  [facility]: [facility] INV[facility] CV LAB;  Service: Cardiovascular;  Laterality: N/A;   ABDOMINAL AORTOGRAM W/LOWER EXTREMITY N/A 07/21/2022   Procedure: ABDOMINAL AORTOGRAM W/LOWER EXTREMITY;  Surgeon: Nada Libman, MD;  Location: [facility];  Service: Cardiovascular;  Laterality: N/A;   ABDOMINAL HYSTERECTOMY     ACHILLES TENDON LENGTHENING  08/15/2022   Procedure: ACHILLES TENDON LENGTHENING;  Surgeon: [name], DPM;  Location: [facility] OR;  Service: Podiatry;;   AMPUTATION Right 05/29/2022   Procedure: RIGHT BELOW THE KNEE AMPUTATION;  Surgeon: [name], MD;  Location: [facility] OR;  Service: Orthopedics;  Laterality: Right;   AMPUTATION Left 09/04/2022   Procedure: AMPUTATION FOOT, serial irrigation;  Surgeon: Louann Sjogren, DPM;  Location:  [facility] OR;  Service: Podiatry;  Laterality: Left;  Surgical team to do block   AMPUTATION Left 10/07/2022   Procedure: LEFT BELOW KNEE AMPUTATION;  Surgeon: [name], MD;  Location: [facility] OR;  Service: Orthopedics;  Laterality: Left;   AV FISTULA PLACEMENT Left 09/02/2017   Procedure: creation of left arm ARTERIOVENOUS (AV) FISTULA;  Surgeon: Nada Libman, MD;  Location: [facility] OR;  Service: Vascular;  Laterality: Left;   COLONOSCOPY  2008   Dr. Darrick Penna: normal    COLONOSCOPY N/A 12/18/2016   Dr. Darrick Penna: multiple tubular adenomas, internal hemorrhoids. Surveillance in 3 years    ESOPHAGEAL DILATION N/A 10/13/2015   Procedure: ESOPHAGEAL  DILATION;  Surgeon: Malissa Hippo, MD;  [facility];  Service: Endoscopy;  Laterality: N/A;   ESOPHAGOGASTRODUODENOSCOPY N/A 10/13/2015   Dr. Karilyn Cota: chronic gastritis on path, no H.pylori. Empiric dilation    ESOPHAGOGASTRODUODENOSCOPY N/A 12/18/2016   Dr. Darrick Penna: mild gastritis. BRAVO study revealed uncontrolled GERD. Dysphagia secondary to uncontrolled reflux   FOOT SURGERY Bilateral    "nerve"     LEFT HEART CATH AND CORONARY ANGIOGRAPHY N/A 12/29/2018   Procedure: LEFT HEART CATH AND CORONARY ANGIOGRAPHY;  Surgeon: Corky Crafts, MD;  Location: [facility] INVASIVE CV LAB;  Service: Cardiovascular;  Laterality: N/A;   LOWER EXTREMITY ANGIOGRAPHY Right 05/04/2022   Procedure: Lower Extremity Angiography;  Surgeon: Victorino Sparrow, MD;  Location: [facility] INVASIVE CV LAB;  Service: Cardiovascular;  Laterality: Right;   LUNG BIOPSY     MASS EXCISION Right 01/09/2013   Procedure: EXCISION OF NEOPLASM OF RIGHT  AXILLA  AND EXCISION OF NEOPLASM OF LEFT AXILLA;  Surgeon: Dalia Heading, MD;  Location: [facility] ORS;  Service: General;  Laterality: Right;  procedure end @ 08:23   MYRINGOTOMY WITH TUBE PLACEMENT Bilateral 04/28/2017   Procedure: BILATERAL MYRINGOTOMY WITH TUBE PLACEMENT;  Surgeon: Newman Pies, MD;  Location: [facility] OR;  Service: ENT;  Laterality: Bilateral;   PERIPHERAL VASCULAR BALLOON ANGIOPLASTY Right 05/04/2022   Procedure: PERIPHERAL VASCULAR BALLOON ANGIOPLASTY;  Surgeon: Victorino Sparrow, MD;  Location: [facility] INVASIVE CV LAB;  Service: Cardiovascular;  Laterality: Right;  PT   PERIPHERAL VASCULAR INTERVENTION Right 05/04/2022   Procedure: PERIPHERAL VASCULAR INTERVENTION;  Surgeon: Victorino Sparrow, MD;  Location: [facility] INVASIVE CV LAB;  Service: Cardiovascular;  Laterality: Right;  SFA   PERIPHERAL VASCULAR INTERVENTION Left 07/21/2022   Procedure: PERIPHERAL VASCULAR INTERVENTION;  Surgeon: Nada Libman, MD;  [facility];  Service: Cardiovascular;  Laterality: Left;    REVISION OF ARTERIOVENOUS GORETEX GRAFT Left 05/04/2018   Procedure: TRANSPOSITION OF CEPHALIC VEIN ARTERIOVENOUS FISTULA LEFT ARM;  Surgeon: Larina Earthly, MD;  Location: [facility] OR;  Service: Vascular;  Laterality: Left;   SAVORY DILATION N/A 12/18/2016   Procedure: SAVORY DILATION;  Surgeon: West Bali, MD;  [facility];  Service: Endoscopy;  Laterality: N/A;   TRANSMETATARSAL AMPUTATION Left 08/15/2022   Procedure: TRANSMETATARSAL AMPUTATION;  Surgeon: [name], DPM;  Location: [facility] OR;  Service: Podiatry;  Laterality: Left;    Family History  Problem Relation Age of Onset   Hypertension Father    Hypercholesterolemia Father    Arthritis Father    Hypertension Sister    Hypercholesterolemia Sister    Breast cancer Sister    Hypertension Sister    Colon cancer Neg Hx    Colon polyps Neg Hx     Social History   Socioeconomic History   Marital status: Married  Spouse name: Mellie Demarzo   Number of children: 2   Years of education: 12   Highest education level: 12th grade  Occupational History   Occupation: retired   Tobacco Use   Smoking status: Never    Passive exposure: Never   Smokeless tobacco: Never   Tobacco comments:    Verified by Daughter, [name]  Vaping Use   Vaping status: Never Used  Substance and Sexual Activity   Alcohol use: No   Drug use: No   Sexual activity: Not Currently    Partners: Male  Other Topics Concern   Not on file  Social History Narrative   Lives alone with husband    Social Determinants of Health   Financial Resource Strain: Medium Risk (05/12/2022)   Overall Financial Resource Strain (CARDIA)    Difficulty of Paying Living Expenses: Somewhat hard  Food Insecurity: No Food Insecurity (11/02/2022)   Hunger Vital Sign    Worried About Running Out of Food in the Last Year: Never true    Ran Out of Food in the Last Year: Never true  Transportation Needs: No Transportation Needs (10/07/2022)   PRAPARE -  Administrator, Civil Service (Medical): No    Lack of Transportation (Non-Medical): No  Physical Activity: Inactive (05/12/2022)   Exercise Vital Sign    Days of Exercise per Week: 0 days    Minutes of Exercise per Session: 0 min  Stress: No Stress Concern Present (05/12/2022)   [employer] of Occupational Health - Occupational Stress Questionnaire    Feeling of Stress : Not at all  Social Connections: Socially Integrated (05/12/2022)   Social Connection and Isolation Panel [NHANES]    Frequency of Communication with Friends and Family: More than three times a week    Frequency of Social Gatherings with Friends and Family: More than three times a week    Attends Religious Services: More than 4 times per year    Active Member of [employer] or Organizations: Yes    Attends Banker Meetings: 1 to 4 times per year    Marital Status: Married  Catering manager Violence: Not At Risk (10/07/2022)   Humiliation, Afraid, Rape, and Kick questionnaire    Fear of Current or Ex-Partner: No    Emotionally Abused: No    Physically Abused: No    Sexually Abused: No    Outpatient Medications Prior to Visit  Medication Sig Dispense Refill   aspirin 81 MG chewable tablet CHEW TWO TABLETS BY MOUTH EVERY DAY (Patient taking differently: Chew 162 mg by mouth daily.) 56 tablet 11   azelastine (OPTIVAR) 0.05 % ophthalmic solution Place 1 drop into both eyes 2 (two) times daily. (Patient taking differently: Place 1 drop into both eyes 2 (two) times daily as needed (allergies).) 6 mL 2   B Complex-C-Folic Acid (RENA-VITE RX) 1 MG TABS Take 1 tablet by mouth daily. (Patient not taking: Reported on 11/02/2022)     Blood Glucose Monitoring Suppl (ONETOUCH VERIO) w/Device KIT Use to check blood sugar 4X daily. 1 kit 0   calcitRIOL (ROCALTROL) 0.25 MCG capsule TAKE 1 CAPSULE (0.25 MCG TOTAL) BY MOUTH 2 (TWO) TIMES DAILY WITH A MEAL. (Patient not taking: Reported on 11/02/2022) 180 capsule 1    cinacalcet (SENSIPAR) 30 MG tablet Take 3 tablets (90 mg total) by mouth 3 (three) times a week. (Patient not taking: Reported on 11/02/2022) 60 tablet    Continuous Blood Gluc Receiver (FREESTYLE LIBRE 2 READER) DEVI 1  each by Does not apply route daily. 1 each 3   Continuous Blood Gluc Sensor (FREESTYLE LIBRE 2 SENSOR) MISC 1 each by Does not apply route every 14 (fourteen) days. 6 each 3   DULoxetine (CYMBALTA) 60 MG capsule TAKE (1) CAPSULE BY MOUTH ONCE DAILY. (Patient taking differently: Take 60 mg by mouth daily.) 28 capsule 11   ezetimibe (ZETIA) 10 MG tablet Take 1 tablet (10 mg total) by mouth daily. 28 tablet 11   FIASP FLEXTOUCH 100 UNIT/ML FlexTouch Pen INJECT 18 TO 30 UNITS INTO THE SKIN WITH BREAKFAST, WITH LUNCH, AND WITH EVENING MEAL 90 mL 3   glucose blood (ONETOUCH VERIO) test strip Use as instructed to check blood sugar 4X daily. 200 each 2   hydrOXYzine (ATARAX) 25 MG tablet Take 25 mg by mouth every 12 (twelve) hours as needed for itching.     insulin aspart (NOVOLOG) 100 UNIT/ML injection Inject 1-10 Units into the skin 3 (three) times daily before meals. Sliding scale 101-150=1 units 151-200=2 units 201-250=4 units 251-300=6 units 301-350=8 units 351-450=10 units Above 450 call MD     insulin glargine, 2 Unit Dial, (TOUJEO MAX SOLOSTAR) 300 UNIT/ML Solostar Pen Inject 30 Units into the skin daily. May need to slowly increase the dose depending upon your blood sugar, follow-up with PCP 1 mL 0   Insulin Pen Needle 32G X 4 MM MISC Use 4x a day 300 each 3   metoprolol succinate (TOPROL-XL) 50 MG 24 hr tablet TAKE (1) TABLET BY MOUTH DAILY WITH FOOD *TAKE AFTER DIALYSIS* (Patient taking differently: Take 50 mg by mouth See admin instructions. Take Tues, thurs, sat & sunday) 28 tablet 11   OneTouch Delica Lancets 33G MISC Use to check blood sugar 4X daily. 100 each 2   oxyCODONE-acetaminophen (PERCOCET/ROXICET) 5-325 MG tablet Take 1 tablet by mouth every 4 (four) hours as  needed. 30 tablet 0   pregabalin (LYRICA) 50 MG capsule TAKE ONE CAPSULE BY MOUTH three times a day 84 capsule 5   sertraline (ZOLOFT) 50 MG tablet TAKE ONE TABLET BY MOUTH EVERY DAY (Patient taking differently: Take 100 mg by mouth daily.) 28 tablet 11   torsemide (DEMADEX) 100 MG tablet TAKE 1 TABLET BY MOUTH TWICE DAILY *HOLD MORNING DOSE ON DIALYSIS DAYS* (Patient not taking: Reported on 11/02/2022) 56 tablet 11   [name] 500 MG chewable tablet Chew 500-1,000 mg by mouth See admin instructions. Take 1000mg  (2 tablets) by mouth with meals and 500mg  (1 tablet) with snacks     furosemide (LASIX) 80 MG tablet Take 80 mg by mouth 2 (two) times daily. (Patient not taking: Reported on 11/02/2022)     torsemide (DEMADEX) 100 MG tablet Take 100 mg by mouth 2 (two) times daily.     Facility-Administered Medications Prior to Visit  Medication Dose Route Frequency Provider Last Rate Last Admin   0.9 %  sodium chloride infusion   Intravenous Continuous Lockamy, Randi L, NP-C   Stopped at 10/07/22 1422    Allergies  Allergen Reactions   Ace Inhibitors Anaphylaxis and Swelling   Penicillins Itching, Swelling and Other (See Comments)    Did it involve swelling of the face/tongue/throat, SOB, or low BP? Unknown Did it involve sudden or severe rash/hives, skin peeling, or any reaction on the inside of your mouth or nose? Unknown Did you need to seek medical attention at a hospital or doctor's office? Unknown When did it last happen?      years  If all above answers  are "NO", may proceed with cephalosporin use.    Statins Other (See Comments)    elevated LFT's     Albuterol Swelling    ROS Review of Systems  Constitutional:  Negative for chills and fever.  HENT:  Negative for congestion, sinus pressure, sinus pain and sore throat.   Eyes:  Negative for pain and discharge.  Respiratory:  Negative for cough and shortness of breath.   Cardiovascular:  Negative for chest pain and palpitations.   Gastrointestinal:  Negative for abdominal pain, diarrhea, nausea and vomiting.  Endocrine: Negative for polydipsia and polyuria.  Genitourinary:  Negative for dysuria and hematuria.  Musculoskeletal:  Negative for neck pain and neck stiffness.  Skin:  Negative for rash.  Neurological:  Negative for dizziness and weakness.  Psychiatric/Behavioral:  Negative for agitation and behavioral problems.       Objective:    Physical Exam Vitals reviewed.  Constitutional:      General: She is not in acute distress.    Appearance: She is not diaphoretic.  HENT:     Head: Normocephalic and atraumatic.     Nose: Nose normal. No congestion.     Mouth/Throat:     Mouth: Mucous membranes are moist.     Pharynx: No posterior oropharyngeal erythema.  Eyes:     General: No scleral icterus.    Extraocular Movements: Extraocular movements intact.  Cardiovascular:     Rate and Rhythm: Normal rate and regular rhythm.     Heart sounds: Murmur (Systolic) heard.  Pulmonary:     Breath sounds: Normal breath sounds. No wheezing or rales.  Musculoskeletal:     Cervical back: Neck supple. No tenderness.     Comments: S/p b/l BKA  Skin:    General: Skin is warm.     Findings: No rash.     Comments: Dressing over left BKA stump- C/D/I Right third digit - dark brown discoloration near fingernail, no tenderness or local discharge  Neurological:     General: No focal deficit present.     Mental Status: She is alert and oriented to person, place, and time.  Psychiatric:        Mood and Affect: Mood normal.        Behavior: Behavior normal.     BP (!) 158/78 (BP Location: Right Arm)   Pulse 99   SpO2 100%  Wt Readings from Last 3 Encounters:  10/14/22 163 lb 5.8 oz (74.1 kg)  10/01/22 174 lb (78.9 kg)  09/25/22 174 lb 2.6 oz (79 kg)    Lab Results  Component Value Date   TSH 0.91 10/28/2021   Lab Results  Component Value Date   WBC 7.6 10/14/2022   HGB 9.1 (L) 10/14/2022   HCT 29.4 (L)  10/14/2022   MCV 79.9 (L) 10/14/2022   PLT 273 10/14/2022   Lab Results  Component Value Date   NA 138 10/12/2022   K 4.9 10/12/2022   CO2 25 10/12/2022   GLUCOSE 171 (H) 10/12/2022   BUN 96 (H) 10/12/2022   CREATININE 10.08 (H) 10/12/2022   BILITOT 0.8 10/01/2022   ALKPHOS 94 10/01/2022   AST 18 10/01/2022   ALT 30 10/01/2022   PROT 7.6 10/01/2022   ALBUMIN 2.7 (L) 10/12/2022   CALCIUM 9.1 10/12/2022   ANIONGAP 11 10/12/2022   EGFR 9 (L) 07/07/2022   Lab Results  Component Value Date   CHOL 156 07/22/2022   Lab Results  Component Value Date   HDL 26 (  L) 07/22/2022   Lab Results  Component Value Date   LDLCALC 70 07/22/2022   Lab Results  Component Value Date   TRIG 301 (H) 07/22/2022   Lab Results  Component Value Date   CHOLHDL 6.0 07/22/2022   Lab Results  Component Value Date   HGBA1C 6.3 (H) 07/07/2022      Assessment & Plan:   Problem List Items Addressed This Visit       Cardiovascular and Mediastinum   Malignant hypertension    BP Readings from Last 1 Encounters:  11/12/22 (!) 158/78   Uncontrolled with torsemide 100 mg twice daily and metoprolol 50 mg QD Added amlodipine 5 mg QD Counseled for compliance with the medications Renal diet as per HD center       Relevant Medications   amLODipine (NORVASC) 5 MG tablet     Musculoskeletal and Integument   Paronychia of finger of right hand    Appears well-healing Mupirocin ointment for bacterial PPx      Relevant Medications   mupirocin ointment (BACTROBAN) 2 %     Other   Hospital discharge follow-up    Hospital chart reviewed, including discharge summary Medications reconciled and reviewed with the patient in detail      Status post below-knee amputation of left lower extremity (HCC) - Primary    On 10/21/22 She had osteomyelitis of left ankle and foot -completed antibiotics now She has b/l BKA now -wheelchair-bound Referred to home health for skilled nursing -needs continued  wound care      Relevant Orders   Ambulatory referral to Home Health    Meds ordered this encounter  Medications   amLODipine (NORVASC) 5 MG tablet    Sig: Take 1 tablet (5 mg total) by mouth daily.    Dispense:  30 tablet    Refill:  3   mupirocin ointment (BACTROBAN) 2 %    Sig: Apply 1 Application topically 2 (two) times daily.    Dispense:  22 g    Refill:  0    Follow-up: Return in about 6 weeks (around 12/24/2022) for HTN.    Anabel Halon, MD

## 2022-11-12 NOTE — Telephone Encounter (Signed)
Maralyn Sago (rn) from [facility] called to discuss wound care orders with pt doctor. Please call Sarah at [phone]

## 2022-11-12 NOTE — Patient Instructions (Signed)
Please apply Mupirocin ointment over right finger until it looks clear.  Please start taking Amlodipine as prescribed.  Please continue to follow low carb and low salt diet.

## 2022-11-13 ENCOUNTER — Telehealth: Payer: Self-pay | Admitting: Family Medicine

## 2022-11-13 ENCOUNTER — Encounter: Payer: Self-pay | Admitting: Internal Medicine

## 2022-11-13 DIAGNOSIS — I5032 Chronic diastolic (congestive) heart failure: Secondary | ICD-10-CM | POA: Diagnosis not present

## 2022-11-13 DIAGNOSIS — D631 Anemia in chronic kidney disease: Secondary | ICD-10-CM | POA: Diagnosis not present

## 2022-11-13 DIAGNOSIS — N2581 Secondary hyperparathyroidism of renal origin: Secondary | ICD-10-CM | POA: Diagnosis not present

## 2022-11-13 DIAGNOSIS — I132 Hypertensive heart and chronic kidney disease with heart failure and with stage 5 chronic kidney disease, or end stage renal disease: Secondary | ICD-10-CM | POA: Diagnosis not present

## 2022-11-13 DIAGNOSIS — E1151 Type 2 diabetes mellitus with diabetic peripheral angiopathy without gangrene: Secondary | ICD-10-CM | POA: Diagnosis not present

## 2022-11-13 DIAGNOSIS — M62522 Muscle wasting and atrophy, not elsewhere classified, left upper arm: Secondary | ICD-10-CM | POA: Diagnosis not present

## 2022-11-13 DIAGNOSIS — E1122 Type 2 diabetes mellitus with diabetic chronic kidney disease: Secondary | ICD-10-CM | POA: Diagnosis not present

## 2022-11-13 DIAGNOSIS — Z4781 Encounter for orthopedic aftercare following surgical amputation: Secondary | ICD-10-CM | POA: Diagnosis not present

## 2022-11-13 DIAGNOSIS — N186 End stage renal disease: Secondary | ICD-10-CM | POA: Diagnosis not present

## 2022-11-13 DIAGNOSIS — Z992 Dependence on renal dialysis: Secondary | ICD-10-CM | POA: Diagnosis not present

## 2022-11-13 DIAGNOSIS — L03011 Cellulitis of right finger: Secondary | ICD-10-CM | POA: Insufficient documentation

## 2022-11-13 DIAGNOSIS — M62521 Muscle wasting and atrophy, not elsewhere classified, right upper arm: Secondary | ICD-10-CM | POA: Diagnosis not present

## 2022-11-13 NOTE — Assessment & Plan Note (Signed)
BP Readings from Last 1 Encounters:  11/12/22 (!) 158/78   Uncontrolled with torsemide 100 mg twice daily and metoprolol 50 mg QD Added amlodipine 5 mg QD Counseled for compliance with the medications Renal diet as per HD center

## 2022-11-13 NOTE — Assessment & Plan Note (Signed)
Hospital chart reviewed, including discharge summary Medications reconciled and reviewed with the patient in detail 

## 2022-11-13 NOTE — Assessment & Plan Note (Signed)
Appears well-healing Mupirocin ointment for bacterial PPx

## 2022-11-13 NOTE — Telephone Encounter (Signed)
Maralyn Sago called from [facility]. Referred to contact pcp fo rin for post hospital for rehab left BKA needs order verbals for 1 week for 6 weeks to monitor left BKA disease and medication.  Order to add a Child psychotherapist to see if she can qualify for an aid since patient is bilateral BKA.  Sarah call back # [phone]

## 2022-11-13 NOTE — Assessment & Plan Note (Signed)
On 10/21/22 She had osteomyelitis of left ankle and foot -completed antibiotics now She has b/l BKA now -wheelchair-bound Referred to home health for skilled nursing -needs continued wound care

## 2022-11-16 ENCOUNTER — Telehealth: Payer: Self-pay | Admitting: Family Medicine

## 2022-11-16 NOTE — Telephone Encounter (Signed)
Kathryn Beck called needs verbal orders evaluated Friday needs occupational therapy for 1 week for 2 weeks, skip a week, 1 week for 1 week for diabetic instructions, needs order for prevention, safety exercises, health promotion and mobility. Call back # [phone].

## 2022-11-16 NOTE — Telephone Encounter (Signed)
Kathryn Beck called from [facility] needs a script for DME for a hoyer lift for dialysis - amputation of left leg. Call back # [phone].

## 2022-11-17 ENCOUNTER — Encounter: Payer: Self-pay | Admitting: Neurology

## 2022-11-17 ENCOUNTER — Ambulatory Visit: Payer: Medicare HMO | Admitting: "Endocrinology

## 2022-11-17 DIAGNOSIS — N186 End stage renal disease: Secondary | ICD-10-CM | POA: Diagnosis not present

## 2022-11-17 DIAGNOSIS — M62522 Muscle wasting and atrophy, not elsewhere classified, left upper arm: Secondary | ICD-10-CM | POA: Diagnosis not present

## 2022-11-17 DIAGNOSIS — D631 Anemia in chronic kidney disease: Secondary | ICD-10-CM | POA: Diagnosis not present

## 2022-11-17 DIAGNOSIS — Z4781 Encounter for orthopedic aftercare following surgical amputation: Secondary | ICD-10-CM | POA: Diagnosis not present

## 2022-11-17 DIAGNOSIS — I5032 Chronic diastolic (congestive) heart failure: Secondary | ICD-10-CM | POA: Diagnosis not present

## 2022-11-17 DIAGNOSIS — M62521 Muscle wasting and atrophy, not elsewhere classified, right upper arm: Secondary | ICD-10-CM | POA: Diagnosis not present

## 2022-11-17 DIAGNOSIS — I132 Hypertensive heart and chronic kidney disease with heart failure and with stage 5 chronic kidney disease, or end stage renal disease: Secondary | ICD-10-CM | POA: Diagnosis not present

## 2022-11-17 DIAGNOSIS — E1151 Type 2 diabetes mellitus with diabetic peripheral angiopathy without gangrene: Secondary | ICD-10-CM | POA: Diagnosis not present

## 2022-11-17 DIAGNOSIS — E1122 Type 2 diabetes mellitus with diabetic chronic kidney disease: Secondary | ICD-10-CM | POA: Diagnosis not present

## 2022-11-17 NOTE — Telephone Encounter (Signed)
She will need to be seen and evaluated. Please add her to the cancellation list. Thanks

## 2022-11-17 NOTE — Telephone Encounter (Signed)
LMTRC-KG 

## 2022-11-17 NOTE — Telephone Encounter (Signed)
Friday is fine

## 2022-11-17 NOTE — Telephone Encounter (Signed)
Verbal order given  

## 2022-11-17 NOTE — Telephone Encounter (Signed)
Tuesday 345

## 2022-11-18 ENCOUNTER — Telehealth: Payer: Self-pay | Admitting: Family Medicine

## 2022-11-18 ENCOUNTER — Emergency Department (HOSPITAL_COMMUNITY): Payer: Medicare PPO

## 2022-11-18 ENCOUNTER — Encounter (HOSPITAL_COMMUNITY): Payer: Self-pay

## 2022-11-18 ENCOUNTER — Other Ambulatory Visit: Payer: Self-pay

## 2022-11-18 ENCOUNTER — Observation Stay (HOSPITAL_COMMUNITY)
Admission: EM | Admit: 2022-11-18 | Discharge: 2022-11-19 | Disposition: A | Discharge status: Home Health | Payer: Medicare PPO | Attending: Internal Medicine | Admitting: Internal Medicine

## 2022-11-18 DIAGNOSIS — R262 Difficulty in walking, not elsewhere classified: Secondary | ICD-10-CM | POA: Insufficient documentation

## 2022-11-18 DIAGNOSIS — I10 Essential (primary) hypertension: Secondary | ICD-10-CM | POA: Diagnosis not present

## 2022-11-18 DIAGNOSIS — I739 Peripheral vascular disease, unspecified: Secondary | ICD-10-CM | POA: Diagnosis present

## 2022-11-18 DIAGNOSIS — M545 Low back pain, unspecified: Secondary | ICD-10-CM

## 2022-11-18 DIAGNOSIS — I132 Hypertensive heart and chronic kidney disease with heart failure and with stage 5 chronic kidney disease, or end stage renal disease: Secondary | ICD-10-CM | POA: Diagnosis not present

## 2022-11-18 DIAGNOSIS — R0602 Shortness of breath: Secondary | ICD-10-CM | POA: Diagnosis not present

## 2022-11-18 DIAGNOSIS — R918 Other nonspecific abnormal finding of lung field: Secondary | ICD-10-CM | POA: Diagnosis not present

## 2022-11-18 DIAGNOSIS — R2689 Other abnormalities of gait and mobility: Secondary | ICD-10-CM | POA: Insufficient documentation

## 2022-11-18 DIAGNOSIS — I120 Hypertensive chronic kidney disease with stage 5 chronic kidney disease or end stage renal disease: Secondary | ICD-10-CM | POA: Diagnosis not present

## 2022-11-18 DIAGNOSIS — Z79899 Other long term (current) drug therapy: Secondary | ICD-10-CM | POA: Diagnosis not present

## 2022-11-18 DIAGNOSIS — Z7982 Long term (current) use of aspirin: Secondary | ICD-10-CM | POA: Insufficient documentation

## 2022-11-18 DIAGNOSIS — M6281 Muscle weakness (generalized): Secondary | ICD-10-CM | POA: Diagnosis not present

## 2022-11-18 DIAGNOSIS — J984 Other disorders of lung: Secondary | ICD-10-CM | POA: Diagnosis not present

## 2022-11-18 DIAGNOSIS — Z8673 Personal history of transient ischemic attack (TIA), and cerebral infarction without residual deficits: Secondary | ICD-10-CM | POA: Insufficient documentation

## 2022-11-18 DIAGNOSIS — I96 Gangrene, not elsewhere classified: Secondary | ICD-10-CM | POA: Diagnosis not present

## 2022-11-18 DIAGNOSIS — J189 Pneumonia, unspecified organism: Secondary | ICD-10-CM | POA: Diagnosis not present

## 2022-11-18 DIAGNOSIS — Z7984 Long term (current) use of oral hypoglycemic drugs: Secondary | ICD-10-CM | POA: Insufficient documentation

## 2022-11-18 DIAGNOSIS — E1122 Type 2 diabetes mellitus with diabetic chronic kidney disease: Secondary | ICD-10-CM | POA: Diagnosis not present

## 2022-11-18 DIAGNOSIS — I509 Heart failure, unspecified: Secondary | ICD-10-CM | POA: Diagnosis not present

## 2022-11-18 DIAGNOSIS — Z794 Long term (current) use of insulin: Secondary | ICD-10-CM | POA: Insufficient documentation

## 2022-11-18 DIAGNOSIS — R531 Weakness: Principal | ICD-10-CM

## 2022-11-18 DIAGNOSIS — Z992 Dependence on renal dialysis: Secondary | ICD-10-CM | POA: Insufficient documentation

## 2022-11-18 DIAGNOSIS — J811 Chronic pulmonary edema: Secondary | ICD-10-CM | POA: Diagnosis not present

## 2022-11-18 DIAGNOSIS — I700 Atherosclerosis of aorta: Secondary | ICD-10-CM | POA: Diagnosis not present

## 2022-11-18 DIAGNOSIS — E1165 Type 2 diabetes mellitus with hyperglycemia: Secondary | ICD-10-CM | POA: Diagnosis present

## 2022-11-18 DIAGNOSIS — J9601 Acute respiratory failure with hypoxia: Secondary | ICD-10-CM | POA: Insufficient documentation

## 2022-11-18 DIAGNOSIS — Z8616 Personal history of COVID-19: Secondary | ICD-10-CM | POA: Diagnosis not present

## 2022-11-18 DIAGNOSIS — Z89511 Acquired absence of right leg below knee: Secondary | ICD-10-CM | POA: Diagnosis not present

## 2022-11-18 DIAGNOSIS — S88912A Complete traumatic amputation of left lower leg, level unspecified, initial encounter: Secondary | ICD-10-CM | POA: Diagnosis not present

## 2022-11-18 DIAGNOSIS — E785 Hyperlipidemia, unspecified: Secondary | ICD-10-CM | POA: Diagnosis present

## 2022-11-18 DIAGNOSIS — N186 End stage renal disease: Secondary | ICD-10-CM

## 2022-11-18 DIAGNOSIS — D631 Anemia in chronic kidney disease: Secondary | ICD-10-CM | POA: Diagnosis not present

## 2022-11-18 DIAGNOSIS — N250 Renal osteodystrophy: Secondary | ICD-10-CM | POA: Diagnosis not present

## 2022-11-18 LAB — CBC WITH DIFFERENTIAL/PLATELET
Abs Immature Granulocytes: 0.06 10*3/uL (ref 0.00–0.07)
Basophils Absolute: 0 10*3/uL (ref 0.0–0.1)
Basophils Relative: 0 %
Eosinophils Absolute: 0.1 10*3/uL (ref 0.0–0.5)
Eosinophils Relative: 1 %
HCT: 33 % — ABNORMAL LOW (ref 36.0–46.0)
Hemoglobin: 9.7 g/dL — ABNORMAL LOW (ref 12.0–15.0)
Immature Granulocytes: 1 %
Lymphocytes Relative: 13 %
Lymphs Abs: 0.9 10*3/uL (ref 0.7–4.0)
MCH: 24.6 pg — ABNORMAL LOW (ref 26.0–34.0)
MCHC: 29.4 g/dL — ABNORMAL LOW (ref 30.0–36.0)
MCV: 83.8 fL (ref 80.0–100.0)
Monocytes Absolute: 0.5 10*3/uL (ref 0.1–1.0)
Monocytes Relative: 7 %
Neutro Abs: 5.6 10*3/uL (ref 1.7–7.7)
Neutrophils Relative %: 78 %
Platelets: 289 10*3/uL (ref 150–400)
RBC: 3.94 MIL/uL (ref 3.87–5.11)
RDW: 18.7 % — ABNORMAL HIGH (ref 11.5–15.5)
WBC: 7.2 10*3/uL (ref 4.0–10.5)
nRBC: 0.8 % — ABNORMAL HIGH (ref 0.0–0.2)

## 2022-11-18 LAB — BASIC METABOLIC PANEL
Anion gap: 19 — ABNORMAL HIGH (ref 5–15)
BUN: 98 mg/dL — ABNORMAL HIGH (ref 8–23)
CO2: 19 mmol/L — ABNORMAL LOW (ref 22–32)
Calcium: 9.2 mg/dL (ref 8.9–10.3)
Chloride: 107 mmol/L (ref 98–111)
Creatinine, Ser: 11.36 mg/dL — ABNORMAL HIGH (ref 0.44–1.00)
GFR, Estimated: 3 mL/min — ABNORMAL LOW (ref >60)
Glucose, Bld: 225 mg/dL — ABNORMAL HIGH (ref 70–99)
Potassium: 5.3 mmol/L — ABNORMAL HIGH (ref 3.5–5.1)
Sodium: 145 mmol/L (ref 135–145)

## 2022-11-18 LAB — SARS CORONAVIRUS 2 BY RT PCR: SARS Coronavirus 2 by RT PCR: NEGATIVE

## 2022-11-18 LAB — HEPATITIS B SURFACE ANTIGEN: Hepatitis B Surface Ag: NONREACTIVE

## 2022-11-18 LAB — PROCALCITONIN: Procalcitonin: 0.25 ng/mL

## 2022-11-18 LAB — AMMONIA: Ammonia: 31 umol/L (ref 9–35)

## 2022-11-18 MED ORDER — ONDANSETRON HCL 4 MG PO TABS: 4.0000 mg | ORAL_TABLET | Freq: Four times a day (QID) | ORAL | Status: DC | PRN

## 2022-11-18 MED ORDER — OXYCODONE HCL 5 MG PO TABS: 5.0000 mg | ORAL_TABLET | ORAL | Status: DC | PRN

## 2022-11-18 MED ORDER — ONDANSETRON HCL 4 MG/2ML IJ SOLN: 4.0000 mg | Freq: Four times a day (QID) | INTRAMUSCULAR | Status: DC | PRN

## 2022-11-18 MED ORDER — METOPROLOL SUCCINATE ER 50 MG PO TB24
50.0000 mg | ORAL_TABLET | Freq: Every day | ORAL | Status: DC
  Administered 2022-11-18 – 2022-11-19 (×2): 50 mg via ORAL
  Filled 2022-11-18 (×2): qty 1

## 2022-11-18 MED ORDER — PREGABALIN 50 MG PO CAPS
50.0000 mg | ORAL_CAPSULE | Freq: Three times a day (TID) | ORAL | Status: DC
  Administered 2022-11-18: 50 mg via ORAL
  Filled 2022-11-18: qty 1

## 2022-11-18 MED ORDER — HEPARIN SODIUM (PORCINE) 1000 UNIT/ML DIALYSIS: 20.0000 [IU]/kg | INTRAMUSCULAR | Status: DC | PRN

## 2022-11-18 MED ORDER — SODIUM CHLORIDE 0.9 % IV SOLN
2.0000 g | Freq: Once | INTRAVENOUS | Status: AC
  Administered 2022-11-18: 2 g via INTRAVENOUS
  Filled 2022-11-18: qty 20

## 2022-11-18 MED ORDER — HEPARIN SODIUM (PORCINE) 5000 UNIT/ML IJ SOLN
5000.0000 [IU] | Freq: Three times a day (TID) | INTRAMUSCULAR | Status: DC
  Administered 2022-11-18 – 2022-11-19 (×3): 5000 [IU] via SUBCUTANEOUS
  Filled 2022-11-18 (×3): qty 1

## 2022-11-18 MED ORDER — LIDOCAINE-PRILOCAINE 2.5-2.5 % EX CREA: 1.0000 | TOPICAL_CREAM | CUTANEOUS | Status: DC | PRN

## 2022-11-18 MED ORDER — SUCROFERRIC OXYHYDROXIDE 500 MG PO CHEW: 500.0000 mg | CHEWABLE_TABLET | ORAL | Status: DC

## 2022-11-18 MED ORDER — HYDROXYZINE HCL 25 MG PO TABS: 25.0000 mg | ORAL_TABLET | Freq: Two times a day (BID) | ORAL | Status: DC | PRN

## 2022-11-18 MED ORDER — CHLORHEXIDINE GLUCONATE CLOTH 2 % EX PADS: 6.0000 | MEDICATED_PAD | Freq: Every day | CUTANEOUS | Status: DC

## 2022-11-18 MED ORDER — SUCROFERRIC OXYHYDROXIDE 500 MG PO CHEW
1000.0000 mg | CHEWABLE_TABLET | Freq: Three times a day (TID) | ORAL | Status: DC
  Filled 2022-11-18 (×6): qty 2

## 2022-11-18 MED ORDER — PENTAFLUOROPROP-TETRAFLUOROETH EX AERO: 1.0000 | INHALATION_SPRAY | CUTANEOUS | Status: DC | PRN

## 2022-11-18 MED ORDER — SERTRALINE HCL 50 MG PO TABS
100.0000 mg | ORAL_TABLET | Freq: Every day | ORAL | Status: DC
  Administered 2022-11-18 – 2022-11-19 (×2): 100 mg via ORAL
  Filled 2022-11-18 (×2): qty 2

## 2022-11-18 MED ORDER — ACETAMINOPHEN 650 MG RE SUPP: 650.0000 mg | Freq: Four times a day (QID) | RECTAL | Status: DC | PRN

## 2022-11-18 MED ORDER — INSULIN GLARGINE-YFGN 100 UNIT/ML ~~LOC~~ SOLN
10.0000 [IU] | Freq: Every day | SUBCUTANEOUS | Status: DC
  Administered 2022-11-18: 10 [IU] via SUBCUTANEOUS
  Filled 2022-11-18 (×2): qty 0.1

## 2022-11-18 MED ORDER — ASPIRIN 81 MG PO CHEW
162.0000 mg | CHEWABLE_TABLET | Freq: Every day | ORAL | Status: DC
  Administered 2022-11-18 – 2022-11-19 (×2): 162 mg via ORAL
  Filled 2022-11-18 (×2): qty 2

## 2022-11-18 MED ORDER — NEPRO/CARBSTEADY PO LIQD: 237.0000 mL | ORAL | Status: DC | PRN

## 2022-11-18 MED ORDER — EZETIMIBE 10 MG PO TABS
10.0000 mg | ORAL_TABLET | Freq: Every day | ORAL | Status: DC
  Administered 2022-11-18 – 2022-11-19 (×2): 10 mg via ORAL
  Filled 2022-11-18 (×2): qty 1

## 2022-11-18 MED ORDER — LIDOCAINE HCL (PF) 1 % IJ SOLN: 5.0000 mL | INTRAMUSCULAR | Status: DC | PRN

## 2022-11-18 MED ORDER — ACETAMINOPHEN 325 MG PO TABS: 650.0000 mg | ORAL_TABLET | Freq: Four times a day (QID) | ORAL | Status: DC | PRN

## 2022-11-18 MED ORDER — AMLODIPINE BESYLATE 5 MG PO TABS
5.0000 mg | ORAL_TABLET | Freq: Every day | ORAL | Status: DC
  Administered 2022-11-18 – 2022-11-19 (×2): 5 mg via ORAL
  Filled 2022-11-18 (×2): qty 1

## 2022-11-18 MED ORDER — METOPROLOL SUCCINATE ER 50 MG PO TB24: 50.0000 mg | ORAL_TABLET | ORAL | Status: DC

## 2022-11-18 MED ORDER — SUCROFERRIC OXYHYDROXIDE 500 MG PO CHEW
500.0000 mg | CHEWABLE_TABLET | Freq: Two times a day (BID) | ORAL | Status: DC | PRN
  Administered 2022-11-19: 500 mg via ORAL
  Filled 2022-11-18 (×2): qty 1

## 2022-11-18 MED ORDER — INSULIN ASPART 100 UNIT/ML IJ SOLN
1.0000 [IU] | Freq: Three times a day (TID) | INTRAMUSCULAR | Status: DC
  Administered 2022-11-19: 2 [IU] via SUBCUTANEOUS

## 2022-11-18 MED ORDER — FUROSEMIDE 10 MG/ML IJ SOLN
60.0000 mg | Freq: Once | INTRAMUSCULAR | Status: AC
  Administered 2022-11-18: 60 mg via INTRAVENOUS
  Filled 2022-11-18: qty 6

## 2022-11-18 MED ORDER — DULOXETINE HCL 60 MG PO CPEP
60.0000 mg | ORAL_CAPSULE | Freq: Every day | ORAL | Status: DC
  Administered 2022-11-18 – 2022-11-19 (×2): 60 mg via ORAL
  Filled 2022-11-18: qty 1
  Filled 2022-11-18: qty 2

## 2022-11-18 NOTE — Telephone Encounter (Signed)
Lvm with verbal orders.  

## 2022-11-18 NOTE — ED Notes (Signed)
XR at bedside

## 2022-11-18 NOTE — Telephone Encounter (Signed)
Currently in eD , I reviewed note, she missed 2 HD sessions , creatinine very high, nephrology consulted

## 2022-11-18 NOTE — ED Notes (Signed)
Pt back from dialysis at this time. Husband at bedside

## 2022-11-18 NOTE — ED Notes (Signed)
Pt typically does not wear oxygen at home. On room air pt de-stated to 88%. Was placed back on 2 L of oxygen with improvement to 93%. Pt continues to c/o ongoing SOB and cough. Sts she feels congested but has been unable to cough anything up. Reports husband at home is also sick with a "cold."

## 2022-11-18 NOTE — Progress Notes (Addendum)
   HEMODIALYSIS TREATMENT NOTE:   Uneventful 3.5 hour low-heparin treatment completed using left upper arm AVF (15g/antegrade).  Room air saturations >97% after one hour of treatment. Goal met: 3.8 liters removed without interruption in UF.  All blood was returned and hemostasis was achieve in 20 minutes.  Post-dialysis:  11/18/22 1825  Vitals  Temp 98.1 F (36.7 C)  Temp Source Oral  BP (!) 163/75  MAP (mmHg) 105  BP Location Right Arm  BP Method Automatic  Patient Position (if appropriate) Sitting  Pulse Rate 78  Pulse Rate Source Monitor  ECG Heart Rate 77  Resp 16  Oxygen Therapy  SpO2 100 %  O2 Device Room Air  Post Treatment  Dialyzer Clearance Lightly streaked  Hemodialysis Intake (mL) 0 mL  Liters Processed 84  Fluid Removed (mL) 3800 mL  Tolerated HD Treatment Yes  Post-Hemodialysis Comments Goal met  AVG/AVF Arterial Site Held (minutes) 10 minutes  AVG/AVF Venous Site Held (minutes) 10 minutes  Fistula / Graft Left Upper arm Arteriovenous fistula  Placement Date/Time: 09/02/17 1150   Placed prior to admission: Yes  Orientation: Left  Access Location: Upper arm  Access Type: Arteriovenous fistula  Fistula / Graft Assessment Thrill;Bruit  Status Patent   Pt was transported back to ED HW4 to await disposition.  Hand-off given to Neldon Mc., Consulting civil engineer.   Arman Filter, RN [facility]

## 2022-11-18 NOTE — Telephone Encounter (Signed)
Denzil Magnuson called from [facility] recently was in the hospital for left knee amputation need order for physical therapy 2 week 3 / 1 week 3 for bed mobility, bed transfer and balance. Ben call back  # [phone].

## 2022-11-18 NOTE — ED Notes (Signed)
Phlebotomy at bedside.

## 2022-11-18 NOTE — ED Provider Notes (Signed)
  Physical Exam  BP (!) 163/75 (BP Location: Right Arm)   Pulse 78   Temp 98.1 F (36.7 C) (Oral)   Resp 16   Wt 81.7 kg   SpO2 100%   BMI 30.92 kg/m   Physical Exam  Procedures  Procedures  ED Course / MDM    Medical Decision Making Amount and/or Complexity of Data Reviewed Labs: ordered. Radiology: ordered.   Received patient in signout.  Had dialysis.  Had missed 2 sessions.  However still feeling shortness of breath.  Reviewed lab work and x-ray showed potential multifocal pneumonia.  Negative COVID testing.  However patient still short of breath.  Fatigue.  Does not feel she can manage at home at this time.  Hypoxia has improved however with the pneumonia I think she would benefit from mission to the hospital.  Will discuss with hospitalist.       Benjiman Core, MD 11/18/22 [phone]

## 2022-11-18 NOTE — Telephone Encounter (Signed)
Verbal orders given ben wanted to let you know he seen patient yesterday and she was very nervous and jittery vitals were ok but he did learn she ended up going to the ED later.

## 2022-11-18 NOTE — Consult Note (Signed)
[facility] Renal Consultation Note    Indication for Consultation:  Management of ESRD/hemodialysis; anemia, hypertension/volume and secondary hyperparathyroidism  HPI: Kathryn Beck is a 67 y.o. female  with a past medical history of ESRD on HD MWF at [facility], DM2, hypertension, history of CVA, PVD status post right BKA, recent left foot osteomyelitis s/p left transtibial amputation complicated by wound dehiscence who presented to [facility] ED via EMS on 11/18/22 complaining of SOB.  She reports that she missed her last 2 HD sessions.  In the ED, Temp 98.7, BP 190/82, HR 76, SpO2 96% on 2 L [place].  Labs notable for K 5.3, gluc 225, BUN 98, Cr 11.36, Hgb 9.7.  Negative for covid-19.  CXR with new asymmetric opacification at the left base and patchy opacities within the right lower lung.  Also with increased interstitial markings compatible with pulmonary edema.  She is being admitted and we were consulted to provide dialysis during her stay.  Of note, the family reports that she missed dialysis on Monday because she was not feeling well and was shaking badly and unable to get into her wheelchair.  She reports having uncontrollable "shaking" for the past few weeks.  Her daughter reports that she is taking Lyrica 50 mg bid but it is not on her medlist here.  Past Medical History:  Diagnosis Date   Acid reflux    Amputated left leg (HCC)    Anemia    Arthritis    Axillary masses    Soft tissue - status post excision   Back pain    Cancer (HCC)    CHF (congestive heart failure) (HCC)    COVID-19 virus infection 04/06/2019   Depression    End-stage renal disease (HCC)    M/W/F dialysis   Essential hypertension    Headache    years ago   History of blood transfusion    History of cardiac catheterization    Normal coronary arteries October 2020   History of claustrophobia    History of pneumonia 2019   Hypoxia 04/03/2019   Memory loss    Mixed hyperlipidemia    Obesity     Pancreatitis    Peritoneal dialysis catheter in place [facility])    Pneumonia due to COVID-19 virus 04/02/2019   Sleep apnea    Noncompliant with CPAP   Stroke (HCC)    mini stroke   Type 2 diabetes mellitus (HCC)    Past Surgical History:  Procedure Laterality Date   ABDOMINAL AORTOGRAM W/LOWER EXTREMITY N/A 04/30/2022   Procedure: ABDOMINAL AORTOGRAM W/LOWER EXTREMITY;  Surgeon: Leonie Douglas, MD;  Location: [facility] INVASIVE CV LAB;  Service: Cardiovascular;  Laterality: N/A;   ABDOMINAL AORTOGRAM W/LOWER EXTREMITY N/A 07/21/2022   Procedure: ABDOMINAL AORTOGRAM W/LOWER EXTREMITY;  Surgeon: Nada Libman, MD;  Location: [facility] INVASIVE CV LAB;  Service: Cardiovascular;  Laterality: N/A;   ABDOMINAL HYSTERECTOMY     ACHILLES TENDON LENGTHENING  08/15/2022   Procedure: ACHILLES TENDON LENGTHENING;  Surgeon: [name], DPM;  Location: [facility] OR;  Service: Podiatry;;   AMPUTATION Right 05/29/2022   Procedure: RIGHT BELOW THE KNEE AMPUTATION;  Surgeon: [name], MD;  Location: [facility] OR;  Service: Orthopedics;  Laterality: Right;   AMPUTATION Left 09/04/2022   Procedure: AMPUTATION FOOT, serial irrigation;  Surgeon: Louann Sjogren, DPM;  Location: [facility] OR;  Service: Podiatry;  Laterality: Left;  Surgical team to do block   AMPUTATION Left 10/07/2022   Procedure: LEFT BELOW KNEE AMPUTATION;  Surgeon: [name], MD;  Location: [facility] OR;  Service: Orthopedics;  Laterality: Left;   AV FISTULA PLACEMENT Left 09/02/2017   Procedure: creation of left arm ARTERIOVENOUS (AV) FISTULA;  Surgeon: Nada Libman, MD;  Location: [facility] OR;  Service: Vascular;  Laterality: Left;   COLONOSCOPY  2008   Dr. Darrick Penna: normal    COLONOSCOPY N/A 12/18/2016   Dr. Darrick Penna: multiple tubular adenomas, internal hemorrhoids. Surveillance in 3 years    ESOPHAGEAL DILATION N/A 10/13/2015   Procedure: ESOPHAGEAL DILATION;  Surgeon: Malissa Hippo, MD;  Location: [facility];  Service: Endoscopy;  Laterality: N/A;    ESOPHAGOGASTRODUODENOSCOPY N/A 10/13/2015   Dr. Karilyn Cota: chronic gastritis on path, no H.pylori. Empiric dilation    ESOPHAGOGASTRODUODENOSCOPY N/A 12/18/2016   Dr. Darrick Penna: mild gastritis. BRAVO study revealed uncontrolled GERD. Dysphagia secondary to uncontrolled reflux   FOOT SURGERY Bilateral    "nerve"     LEFT HEART CATH AND CORONARY ANGIOGRAPHY N/A 12/29/2018   Procedure: LEFT HEART CATH AND CORONARY ANGIOGRAPHY;  Surgeon: Corky Crafts, MD;  Location: [facility] INVASIVE CV LAB;  Service: Cardiovascular;  Laterality: N/A;   LOWER EXTREMITY ANGIOGRAPHY Right 05/04/2022   Procedure: Lower Extremity Angiography;  Surgeon: Victorino Sparrow, MD;  Location: [facility] INVASIVE CV LAB;  Service: Cardiovascular;  Laterality: Right;   LUNG BIOPSY     MASS EXCISION Right 01/09/2013   Procedure: EXCISION OF NEOPLASM OF RIGHT  AXILLA  AND EXCISION OF NEOPLASM OF LEFT AXILLA;  Surgeon: Dalia Heading, MD;  Location: [facility] ORS;  Service: General;  Laterality: Right;  procedure end @ 08:23   MYRINGOTOMY WITH TUBE PLACEMENT Bilateral 04/28/2017   Procedure: BILATERAL MYRINGOTOMY WITH TUBE PLACEMENT;  Surgeon: Newman Pies, MD;  Location: [facility] OR;  Service: ENT;  Laterality: Bilateral;   PERIPHERAL VASCULAR BALLOON ANGIOPLASTY Right 05/04/2022   Procedure: PERIPHERAL VASCULAR BALLOON ANGIOPLASTY;  Surgeon: Victorino Sparrow, MD;  Location: [facility] INVASIVE CV LAB;  Service: Cardiovascular;  Laterality: Right;  PT   PERIPHERAL VASCULAR INTERVENTION Right 05/04/2022   Procedure: PERIPHERAL VASCULAR INTERVENTION;  Surgeon: Victorino Sparrow, MD;  Location: [facility] INVASIVE CV LAB;  Service: Cardiovascular;  Laterality: Right;  SFA   PERIPHERAL VASCULAR INTERVENTION Left 07/21/2022   Procedure: PERIPHERAL VASCULAR INTERVENTION;  Surgeon: Nada Libman, MD;  [facility]: [facility] LAB;  Service: Cardiovascular;  Laterality: Left;   REVISION OF ARTERIOVENOUS GORETEX GRAFT Left 05/04/2018   Procedure: TRANSPOSITION OF CEPHALIC VEIN ARTERIOVENOUS  FISTULA LEFT ARM;  Surgeon: Larina Earthly, MD;  Location: [facility] OR;  Service: Vascular;  Laterality: Left;   SAVORY DILATION N/A 12/18/2016   Procedure: SAVORY DILATION;  Surgeon: West Bali, MD;  [facility]: [facility];  Service: Endoscopy;  Laterality: N/A;   TRANSMETATARSAL AMPUTATION Left 08/15/2022   Procedure: TRANSMETATARSAL AMPUTATION;  Surgeon: [name], DPM;  Location: [facility] OR;  Service: Podiatry;  Laterality: Left;   Family History:   Family History  Problem Relation Age of Onset   Hypertension Father    Hypercholesterolemia Father    Arthritis Father    Hypertension Sister    Hypercholesterolemia Sister    Breast cancer Sister    Hypertension Sister    Colon cancer Neg Hx    Colon polyps Neg Hx    Social History:  reports that she has never smoked. She has never been exposed to tobacco smoke. She has never used smokeless tobacco. She reports that she does not drink alcohol and does not use drugs.  Allergies  Allergen Reactions   Ace Inhibitors Anaphylaxis and Swelling   Penicillins Itching, Swelling and Other (See Comments)    Did it involve swelling of the face/tongue/throat, SOB, or low BP? Unknown Did it involve sudden or severe rash/hives, skin peeling, or any reaction on the inside of your mouth or nose? Unknown Did you need to seek medical attention at a hospital or doctor's office? Unknown When did it last happen?      years  If all above answers are "NO", may proceed with cephalosporin use.    Statins Other (See Comments)    elevated LFT's     Albuterol Swelling   Prior to Admission medications   Medication Sig Start Date End Date Taking? Authorizing Provider  amLODipine (NORVASC) 5 MG tablet Take 1 tablet (5 mg total) by mouth daily. 11/12/22   Anabel Halon, MD  aspirin 81 MG chewable tablet CHEW TWO TABLETS BY MOUTH EVERY DAY Patient taking differently: Chew 162 mg by mouth daily. 07/24/22   [name], MD  azelastine (OPTIVAR) 0.05 %  ophthalmic solution Place 1 drop into both eyes 2 (two) times daily. Patient taking differently: Place 1 drop into both eyes 2 (two) times daily as needed (allergies). 05/25/22   [name], MD  B Complex-C-Folic Acid (RENA-VITE RX) 1 MG TABS Take 1 tablet by mouth daily. Patient not taking: Reported on 11/02/2022 04/15/22   [provider]  Blood Glucose Monitoring Suppl (ONETOUCH VERIO) w/Device KIT Use to check blood sugar 4X daily. 04/16/22   Carlus Pavlov, MD  calcitRIOL (ROCALTROL) 0.25 MCG capsule TAKE 1 CAPSULE (0.25 MCG TOTAL) BY MOUTH 2 (TWO) TIMES DAILY WITH A MEAL. Patient not taking: Reported on 11/02/2022 06/05/22   Roma Kayser, MD  cinacalcet (SENSIPAR) 30 MG tablet Take 3 tablets (90 mg total) by mouth 3 (three) times a week. Patient not taking: Reported on 11/02/2022 09/09/22   Marguerita Merles Latif, DO  Continuous Blood Gluc Receiver (FREESTYLE LIBRE 2 READER) DEVI 1 each by Does not apply route daily. 04/16/22   Carlus Pavlov, MD  Continuous Blood Gluc Sensor (FREESTYLE LIBRE 2 SENSOR) MISC 1 each by Does not apply route every 14 (fourteen) days. 04/16/22   Carlus Pavlov, MD  DULoxetine (CYMBALTA) 60 MG capsule TAKE (1) CAPSULE BY MOUTH ONCE DAILY. Patient taking differently: Take 60 mg by mouth daily. 09/17/20   [name], MD  ezetimibe (ZETIA) 10 MG tablet Take 1 tablet (10 mg total) by mouth daily. 05/25/22   [name], MD  FIASP FLEXTOUCH 100 UNIT/ML FlexTouch Pen INJECT 18 TO 30 UNITS INTO THE SKIN WITH BREAKFAST, WITH LUNCH, AND WITH EVENING MEAL 10/20/22   Carlus Pavlov, MD  glucose blood (ONETOUCH VERIO) test strip Use as instructed to check blood sugar 4X daily. 04/16/22   Carlus Pavlov, MD  hydrOXYzine (ATARAX) 25 MG tablet Take 25 mg by mouth every 12 (twelve) hours as needed for itching.    [provider]  insulin aspart (NOVOLOG) 100 UNIT/ML injection Inject 1-10 Units into the skin 3 (three) times daily  before meals. Sliding scale 101-150=1 units 151-200=2 units 201-250=4 units 251-300=6 units 301-350=8 units 351-450=10 units Above 450 call MD    [provider]  insulin glargine, 2 Unit Dial, (TOUJEO MAX SOLOSTAR) 300 UNIT/ML Solostar Pen Inject 30 Units into the skin daily. May need to slowly increase the dose depending upon your blood sugar, follow-up with PCP 07/22/22   Lanae Boast, MD  Insulin  Pen Needle 32G X 4 MM MISC Use 4x a day 05/25/22   Carlus Pavlov, MD  metoprolol succinate (TOPROL-XL) 50 MG 24 hr tablet TAKE (1) TABLET BY MOUTH DAILY WITH FOOD *TAKE AFTER DIALYSIS* Patient taking differently: Take 50 mg by mouth See admin instructions. Take Tues, thurs, sat & sunday 07/29/21   [name], MD  mupirocin ointment (BACTROBAN) 2 % Apply 1 Application topically 2 (two) times daily. 11/12/22   Anabel Halon, MD  OneTouch Delica Lancets 33G MISC Use to check blood sugar 4X daily. 04/16/22   Carlus Pavlov, MD  oxyCODONE-acetaminophen (PERCOCET/ROXICET) 5-325 MG tablet Take 1 tablet by mouth every 4 (four) hours as needed. 10/13/22   [name], MD  pregabalin (LYRICA) 50 MG capsule TAKE ONE CAPSULE BY MOUTH three times a day 08/24/22   Vickki Hearing, MD  sertraline (ZOLOFT) 50 MG tablet TAKE ONE TABLET BY MOUTH EVERY DAY Patient taking differently: Take 100 mg by mouth daily. 09/22/22   [name], MD  torsemide (DEMADEX) 100 MG tablet TAKE 1 TABLET BY MOUTH TWICE DAILY *HOLD MORNING DOSE ON DIALYSIS DAYS* Patient not taking: Reported on 11/02/2022 09/22/22   [name], MD  [name] 500 MG chewable tablet Chew 500-1,000 mg by mouth See admin instructions. Take 1000mg  (2 tablets) by mouth with meals and 500mg  (1 tablet) with snacks 04/15/22   [provider]  FLUoxetine (PROZAC) 10 MG capsule Take 10 mg by mouth daily.    05/28/11  [provider]  glipiZIDE (GLUCOTROL) 10 MG tablet Take 10 mg by mouth 2 (two) times daily before a  meal.    05/28/11  [provider]   Current Facility-Administered Medications  Medication Dose Route Frequency Provider Last Rate Last Admin   Chlorhexidine Gluconate Cloth 2 % PADS 6 each  6 each Topical Q0600 Terrial Rhodes, MD       Current Outpatient Medications  Medication Sig Dispense Refill   amLODipine (NORVASC) 5 MG tablet Take 1 tablet (5 mg total) by mouth daily. 30 tablet 3   aspirin 81 MG chewable tablet CHEW TWO TABLETS BY MOUTH EVERY DAY (Patient taking differently: Chew 162 mg by mouth daily.) 56 tablet 11   azelastine (OPTIVAR) 0.05 % ophthalmic solution Place 1 drop into both eyes 2 (two) times daily. (Patient taking differently: Place 1 drop into both eyes 2 (two) times daily as needed (allergies).) 6 mL 2   B Complex-C-Folic Acid (RENA-VITE RX) 1 MG TABS Take 1 tablet by mouth daily. (Patient not taking: Reported on 11/02/2022)     Blood Glucose Monitoring Suppl (ONETOUCH VERIO) w/Device KIT Use to check blood sugar 4X daily. 1 kit 0   calcitRIOL (ROCALTROL) 0.25 MCG capsule TAKE 1 CAPSULE (0.25 MCG TOTAL) BY MOUTH 2 (TWO) TIMES DAILY WITH A MEAL. (Patient not taking: Reported on 11/02/2022) 180 capsule 1   cinacalcet (SENSIPAR) 30 MG tablet Take 3 tablets (90 mg total) by mouth 3 (three) times a week. (Patient not taking: Reported on 11/02/2022) 60 tablet    Continuous Blood Gluc Receiver (FREESTYLE LIBRE 2 READER) DEVI 1 each by Does not apply route daily. 1 each 3   Continuous Blood Gluc Sensor (FREESTYLE LIBRE 2 SENSOR) MISC 1 each by Does not apply route every 14 (fourteen) days. 6 each 3   DULoxetine (CYMBALTA) 60 MG capsule TAKE (1) CAPSULE BY MOUTH ONCE DAILY. (Patient taking differently: Take 60 mg by mouth daily.) 28 capsule 11   ezetimibe (ZETIA) 10  MG tablet Take 1 tablet (10 mg total) by mouth daily. 28 tablet 11   FIASP FLEXTOUCH 100 UNIT/ML FlexTouch Pen INJECT 18 TO 30 UNITS INTO THE SKIN WITH BREAKFAST, WITH LUNCH, AND WITH EVENING MEAL 90 mL 3    glucose blood (ONETOUCH VERIO) test strip Use as instructed to check blood sugar 4X daily. 200 each 2   hydrOXYzine (ATARAX) 25 MG tablet Take 25 mg by mouth every 12 (twelve) hours as needed for itching.     insulin aspart (NOVOLOG) 100 UNIT/ML injection Inject 1-10 Units into the skin 3 (three) times daily before meals. Sliding scale 101-150=1 units 151-200=2 units 201-250=4 units 251-300=6 units 301-350=8 units 351-450=10 units Above 450 call MD     insulin glargine, 2 Unit Dial, (TOUJEO MAX SOLOSTAR) 300 UNIT/ML Solostar Pen Inject 30 Units into the skin daily. May need to slowly increase the dose depending upon your blood sugar, follow-up with PCP 1 mL 0   Insulin Pen Needle 32G X 4 MM MISC Use 4x a day 300 each 3   metoprolol succinate (TOPROL-XL) 50 MG 24 hr tablet TAKE (1) TABLET BY MOUTH DAILY WITH FOOD *TAKE AFTER DIALYSIS* (Patient taking differently: Take 50 mg by mouth See admin instructions. Take Tues, thurs, sat & sunday) 28 tablet 11   mupirocin ointment (BACTROBAN) 2 % Apply 1 Application topically 2 (two) times daily. 22 g 0   OneTouch Delica Lancets 33G MISC Use to check blood sugar 4X daily. 100 each 2   oxyCODONE-acetaminophen (PERCOCET/ROXICET) 5-325 MG tablet Take 1 tablet by mouth every 4 (four) hours as needed. 30 tablet 0   pregabalin (LYRICA) 50 MG capsule TAKE ONE CAPSULE BY MOUTH three times a day 84 capsule 5   sertraline (ZOLOFT) 50 MG tablet TAKE ONE TABLET BY MOUTH EVERY DAY (Patient taking differently: Take 100 mg by mouth daily.) 28 tablet 11   torsemide (DEMADEX) 100 MG tablet TAKE 1 TABLET BY MOUTH TWICE DAILY *HOLD MORNING DOSE ON DIALYSIS DAYS* (Patient not taking: Reported on 11/02/2022) 56 tablet 11   [name] 500 MG chewable tablet Chew 500-1,000 mg by mouth See admin instructions. Take 1000mg  (2 tablets) by mouth with meals and 500mg  (1 tablet) with snacks     Facility-Administered Medications Ordered in Other Encounters  Medication Dose Route Frequency  Provider Last Rate Last Admin   0.9 %  sodium chloride infusion   Intravenous Continuous Lockamy, Randi L, NP-C   Stopped at 10/07/22 1422   Labs: Basic Metabolic Panel: Recent Labs  Lab 11/18/22 0551  NA 145  K 5.3*  CL 107  CO2 19*  GLUCOSE 225*  BUN 98*  CREATININE 11.36*  CALCIUM 9.2   Liver Function Tests: No results for input(s): "AST", "ALT", "ALKPHOS", "BILITOT", "PROT", "ALBUMIN" in the last 168 hours. No results for input(s): "LIPASE", "AMYLASE" in the last 168 hours. Recent Labs  Lab 11/18/22 0630  AMMONIA 31   CBC: Recent Labs  Lab 11/18/22 0551  WBC 7.2  NEUTROABS 5.6  HGB 9.7*  HCT 33.0*  MCV 83.8  PLT 289   Cardiac Enzymes: No results for input(s): "CKTOTAL", "CKMB", "CKMBINDEX", "TROPONINI" in the last 168 hours. CBG: No results for input(s): "GLUCAP" in the last 168 hours. Iron Studies: No results for input(s): "IRON", "TIBC", "TRANSFERRIN", "FERRITIN" in the last 72 hours. Studies/Results: DG Chest Portable 1 View  Result Date: 11/18/2022 CLINICAL DATA:  Pulmonary edema.  Shortness of breath. EXAM: PORTABLE CHEST 1 VIEW COMPARISON:  08/13/2022 FINDINGS: Unchanged enlargement of  the cardiac silhouette. Aortic atherosclerosis. Increased interstitial markings are identified bilaterally. New asymmetric opacification within the periphery of the left base may represent infiltrate or pleural effusion. Patchy opacities within the right upper and right lower lung. IMPRESSION: 1. New asymmetric opacification within the periphery of the left base may represent infiltrate and/or pleural effusion. 2. Patchy opacities within the right upper and right lower lung may represent atelectasis or infiltrate. 3. Increased interstitial markings compatible with pulmonary edema. Electronically Signed   By: Signa Kell M.D.   On: 11/18/2022 06:43    ROS: Pertinent items are noted in HPI. Physical Exam: Vitals:   11/18/22 0730 11/18/22 0800 11/18/22 0830 11/18/22 0900   BP: (!) 191/89 (!) 190/88 (!) 193/89 (!) 190/85  Pulse: 73 74 75 73  Resp: 14 14 14 13   Temp:      TempSrc:      SpO2: 98% 98% 99% 98%      Weight change:  No intake or output data in the 24 hours ending 11/18/22 1009 BP (!) 190/85   Pulse 73   Temp 98.6 F (37 C) (Rectal)   Resp 13   SpO2 98%  General appearance: slowed mentation and + myoclonic jerking Head: Normocephalic, without obvious abnormality, atraumatic Resp: clear to auscultation bilaterally Cardio: regular rate and rhythm, S1, S2 normal, no murmur, click, rub or gallop GI: soft, non-tender; bowel sounds normal; no masses,  no organomegaly Extremities: s/p bilateral BKA's, LUE AVF +T/B Dialysis Access:  Dialysis Orders: Center:  MWF at [facility] 3:45hr, 400/500, EDW 80g, 2K/2.5Ca, UFP#2, LUE AVF 15g, heparin 1000 unit initial + 1000 unit/hr Meds: Mircera 60 mcg every 2 weeks (last dose 7/8), Venofer 50 mg once weekly, calcitriol 3 mcg every treatment, Sensipar 90 mg every treatment  Assessment/Plan:  Acute hypoxic respiratory failure - will plan for HD with UF and evaluate her response.  Unclear if this is all fluid overload or possible underlying pneumonia.  Will follow after HD.  Myoclonic jerking - likely combination of Lyrica and missed HD.   Should be on Lyrica 50 mg daily not bid.  ESRD -  plan for HD today.  Hypertension/volume  - UF with HD and follow bp and oxygen requirements  Anemia  - follow H/H and will likely require ESA this week.  Metabolic bone disease -  continue with home meds  Nutrition - renal diet, carb modified.  Irena Cords, MD [facility] 11/18/2022, 10:09 AM

## 2022-11-18 NOTE — Telephone Encounter (Signed)
Order for lift placed thru [employer] they will deliver to patients home

## 2022-11-18 NOTE — ED Provider Notes (Signed)
[place] EMERGENCY DEPARTMENT AT [facility] Provider Note   CSN: [id number] Arrival date & time: 11/18/22  7371     History {Add pertinent medical, surgical, social history, OB history to HPI:1} Chief Complaint  Patient presents with   Shortness of Breath    Kathryn Beck is a 67 y.o. female.   Shortness of Breath      Home Medications Prior to Admission medications   Medication Sig Start Date End Date Taking? Authorizing Provider  amLODipine (NORVASC) 5 MG tablet Take 1 tablet (5 mg total) by mouth daily. 11/12/22   Anabel Halon, MD  aspirin 81 MG chewable tablet CHEW TWO TABLETS BY MOUTH EVERY DAY Patient taking differently: Chew 162 mg by mouth daily. 07/24/22   [name], MD  azelastine (OPTIVAR) 0.05 % ophthalmic solution Place 1 drop into both eyes 2 (two) times daily. Patient taking differently: Place 1 drop into both eyes 2 (two) times daily as needed (allergies). 05/25/22   [name], MD  B Complex-C-Folic Acid (RENA-VITE RX) 1 MG TABS Take 1 tablet by mouth daily. Patient not taking: Reported on 11/02/2022 04/15/22   [provider]  Blood Glucose Monitoring Suppl (ONETOUCH VERIO) w/Device KIT Use to check blood sugar 4X daily. 04/16/22   Carlus Pavlov, MD  calcitRIOL (ROCALTROL) 0.25 MCG capsule TAKE 1 CAPSULE (0.25 MCG TOTAL) BY MOUTH 2 (TWO) TIMES DAILY WITH A MEAL. Patient not taking: Reported on 11/02/2022 06/05/22   Roma Kayser, MD  cinacalcet (SENSIPAR) 30 MG tablet Take 3 tablets (90 mg total) by mouth 3 (three) times a week. Patient not taking: Reported on 11/02/2022 09/09/22   Marguerita Merles Latif, DO  Continuous Blood Gluc Receiver (FREESTYLE LIBRE 2 READER) DEVI 1 each by Does not apply route daily. 04/16/22   Carlus Pavlov, MD  Continuous Blood Gluc Sensor (FREESTYLE LIBRE 2 SENSOR) MISC 1 each by Does not apply route every 14 (fourteen) days. 04/16/22   Carlus Pavlov, MD  DULoxetine (CYMBALTA) 60 MG  capsule TAKE (1) CAPSULE BY MOUTH ONCE DAILY. Patient taking differently: Take 60 mg by mouth daily. 09/17/20   [name], MD  ezetimibe (ZETIA) 10 MG tablet Take 1 tablet (10 mg total) by mouth daily. 05/25/22   [name], MD  FIASP FLEXTOUCH 100 UNIT/ML FlexTouch Pen INJECT 18 TO 30 UNITS INTO THE SKIN WITH BREAKFAST, WITH LUNCH, AND WITH EVENING MEAL 10/20/22   Carlus Pavlov, MD  glucose blood (ONETOUCH VERIO) test strip Use as instructed to check blood sugar 4X daily. 04/16/22   Carlus Pavlov, MD  hydrOXYzine (ATARAX) 25 MG tablet Take 25 mg by mouth every 12 (twelve) hours as needed for itching.    [provider]  insulin aspart (NOVOLOG) 100 UNIT/ML injection Inject 1-10 Units into the skin 3 (three) times daily before meals. Sliding scale 101-150=1 units 151-200=2 units 201-250=4 units 251-300=6 units 301-350=8 units 351-450=10 units Above 450 call MD    [provider]  insulin glargine, 2 Unit Dial, (TOUJEO MAX SOLOSTAR) 300 UNIT/ML Solostar Pen Inject 30 Units into the skin daily. May need to slowly increase the dose depending upon your blood sugar, follow-up with PCP 07/22/22   Lanae Boast, MD  Insulin Pen Needle 32G X 4 MM MISC Use 4x a day 05/25/22   Carlus Pavlov, MD  metoprolol succinate (TOPROL-XL) 50 MG 24 hr tablet TAKE (1) TABLET BY MOUTH DAILY WITH FOOD *TAKE AFTER DIALYSIS* Patient taking differently: Take 50 mg by  mouth See admin instructions. Take Tues, thurs, sat & sunday 07/29/21   [name], MD  mupirocin ointment (BACTROBAN) 2 % Apply 1 Application topically 2 (two) times daily. 11/12/22   Anabel Halon, MD  OneTouch Delica Lancets 33G MISC Use to check blood sugar 4X daily. 04/16/22   Carlus Pavlov, MD  oxyCODONE-acetaminophen (PERCOCET/ROXICET) 5-325 MG tablet Take 1 tablet by mouth every 4 (four) hours as needed. 10/13/22   [name], MD  pregabalin (LYRICA) 50 MG capsule TAKE ONE CAPSULE BY MOUTH three times  a day 08/24/22   Vickki Hearing, MD  sertraline (ZOLOFT) 50 MG tablet TAKE ONE TABLET BY MOUTH EVERY DAY Patient taking differently: Take 100 mg by mouth daily. 09/22/22   [name], MD  torsemide (DEMADEX) 100 MG tablet TAKE 1 TABLET BY MOUTH TWICE DAILY *HOLD MORNING DOSE ON DIALYSIS DAYS* Patient not taking: Reported on 11/02/2022 09/22/22   [name], MD  [name] 500 MG chewable tablet Chew 500-1,000 mg by mouth See admin instructions. Take 1000mg  (2 tablets) by mouth with meals and 500mg  (1 tablet) with snacks 04/15/22   [provider]  FLUoxetine (PROZAC) 10 MG capsule Take 10 mg by mouth daily.    05/28/11  [provider]  glipiZIDE (GLUCOTROL) 10 MG tablet Take 10 mg by mouth 2 (two) times daily before a meal.    05/28/11  [provider]      Allergies    Ace inhibitors, Penicillins, Statins, and Albuterol    Review of Systems   Review of Systems  Respiratory:  Positive for shortness of breath.     Physical Exam Updated Vital Signs BP (!) 156/85   Pulse 81   Temp 98.1 F (36.7 C) (Oral)   Resp 16   Wt 81.7 kg   SpO2 93%   BMI 30.92 kg/m  Physical Exam  ED Results / Procedures / Treatments   Labs (all labs ordered are listed, but only abnormal results are displayed) Labs Reviewed  CBC WITH DIFFERENTIAL/PLATELET - Abnormal; Notable for the following components:      Result Value   Hemoglobin 9.7 (*)    HCT 33.0 (*)    MCH 24.6 (*)    MCHC 29.4 (*)    RDW 18.7 (*)    nRBC 0.8 (*)    All other components within normal limits  BASIC METABOLIC PANEL - Abnormal; Notable for the following components:   Potassium 5.3 (*)    CO2 19 (*)    Glucose, Bld 225 (*)    BUN 98 (*)    Creatinine, Ser 11.36 (*)    GFR, Estimated 3 (*)    Anion gap 19 (*)    All other components within normal limits  SARS CORONAVIRUS 2 BY RT PCR  AMMONIA  HEPATITIS B SURFACE ANTIGEN  PROCALCITONIN  HEPATITIS B SURFACE ANTIBODY, QUANTITATIVE   COMPREHENSIVE METABOLIC PANEL  MAGNESIUM  CBC WITH DIFFERENTIAL/PLATELET    EKG EKG Interpretation Date/Time:  Wednesday November 18 2022 05:42:55 EDT Ventricular Rate:  76 PR Interval:  139 QRS Duration:  94 QT Interval:  405 QTC Calculation: 456 R Axis:   72  Text Interpretation: Sinus rhythm nonspecific ST/T changes Confirmed by Pricilla Loveless [id number]) on 11/18/2022 8:56:44 AM  Radiology CT Chest Wo Contrast  Result Date: 11/18/2022 CLINICAL DATA:  Pneumonia, complication suspected, xray done, short of breath after dialysis EXAM: CT CHEST WITHOUT CONTRAST TECHNIQUE: Multidetector CT imaging of the chest was performed following  the standard protocol without IV contrast. RADIATION DOSE REDUCTION: This exam was performed according to the departmental dose-optimization program which includes automated exposure control, adjustment of the mA and/or kV according to patient size and/or use of iterative reconstruction technique. COMPARISON:  11/18/2022, 09/03/2018 FINDINGS: Cardiovascular: Unenhanced imaging of the heart demonstrates cardiomegaly without pericardial effusion. Calcification of the aortic and mitral valves. Normal caliber of the thoracic aorta. Extensive atherosclerosis of the aorta and coronary vasculature. Evaluation of the vascular lumen is limited without IV contrast. Mediastinum/Nodes: No enlarged mediastinal or axillary lymph nodes. Thyroid gland, trachea, and esophagus demonstrate no significant findings. Lungs/Pleura: There are patchy bilateral areas of ground-glass airspace disease, most pronounced within the right upper lobe. Findings could reflect asymmetric edema or multifocal infection, including atypical infection such as viral etiology. No effusion or pneumothorax. Central airways are patent. Upper Abdomen: Stable bilateral low-attenuation adrenal thickening, consistent with hyperplasia or adenomas. No acute upper abdominal findings. Musculoskeletal: No acute or destructive  bony abnormalities. Reconstructed images demonstrate no additional findings. IMPRESSION: 1. Patchy bilateral ground-glass airspace disease, which may reflect asymmetric edema or multifocal infection including atypical organisms such as viral etiology. 2. Cardiomegaly without pericardial effusion. 3. Aortic Atherosclerosis (ICD10-I70.0). Coronary artery atherosclerosis. Electronically Signed   By: Sharlet Salina M.D.   On: 11/18/2022 20:38   DG Chest Portable 1 View  Result Date: 11/18/2022 CLINICAL DATA:  Pulmonary edema.  Shortness of breath. EXAM: PORTABLE CHEST 1 VIEW COMPARISON:  08/13/2022 FINDINGS: Unchanged enlargement of the cardiac silhouette. Aortic atherosclerosis. Increased interstitial markings are identified bilaterally. New asymmetric opacification within the periphery of the left base may represent infiltrate or pleural effusion. Patchy opacities within the right upper and right lower lung. IMPRESSION: 1. New asymmetric opacification within the periphery of the left base may represent infiltrate and/or pleural effusion. 2. Patchy opacities within the right upper and right lower lung may represent atelectasis or infiltrate. 3. Increased interstitial markings compatible with pulmonary edema. Electronically Signed   By: Signa Kell M.D.   On: 11/18/2022 06:43    Procedures Procedures  {Document cardiac monitor, telemetry assessment procedure when appropriate:1}  Medications Ordered in ED Medications  Chlorhexidine Gluconate Cloth 2 % PADS 6 each (has no administration in time range)  pentafluoroprop-tetrafluoroeth (GEBAUERS) aerosol 1 Application (has no administration in time range)  lidocaine (PF) (XYLOCAINE) 1 % injection 5 mL (has no administration in time range)  lidocaine-prilocaine (EMLA) cream 1 Application (has no administration in time range)  feeding supplement (NEPRO CARB STEADY) liquid 237 mL (has no administration in time range)  heparin injection 1,700 Units (has no  administration in time range)  aspirin chewable tablet 162 mg (has no administration in time range)  amLODipine (NORVASC) tablet 5 mg (has no administration in time range)  ezetimibe (ZETIA) tablet 10 mg (has no administration in time range)  furosemide (LASIX) injection 60 mg (has no administration in time range)  DULoxetine (CYMBALTA) DR capsule 60 mg (has no administration in time range)  hydrOXYzine (ATARAX) tablet 25 mg (has no administration in time range)  sertraline (ZOLOFT) tablet 100 mg (has no administration in time range)  insulin aspart (novoLOG) injection 1-10 Units (has no administration in time range)  insulin glargine-yfgn (SEMGLEE) injection 10 Units (has no administration in time range)  pregabalin (LYRICA) capsule 50 mg (has no administration in time range)  heparin injection 5,000 Units (has no administration in time range)  acetaminophen (TYLENOL) tablet 650 mg (has no administration in time range)    Or  acetaminophen (TYLENOL) suppository 650 mg (has no administration in time range)  oxyCODONE (Oxy IR/ROXICODONE) immediate release tablet 5 mg (has no administration in time range)  ondansetron (ZOFRAN) tablet 4 mg (has no administration in time range)    Or  ondansetron (ZOFRAN) injection 4 mg (has no administration in time range)  metoprolol succinate (TOPROL-XL) 24 hr tablet 50 mg (has no administration in time range)  sucroferric oxyhydroxide ([name]) chewable tablet 1,000 mg (has no administration in time range)    And  sucroferric oxyhydroxide ([name]) chewable tablet 500 mg (has no administration in time range)  cefTRIAXone (ROCEPHIN) 2 g in sodium chloride 0.9 % 100 mL IVPB (0 g Intravenous Stopped 11/18/22 2042)    ED Course/ Medical Decision Making/ A&P   {   Click here for ABCD2, HEART and other calculatorsREFRESH Note before signing :1}                              Medical Decision Making Amount and/or Complexity of Data Reviewed Labs:  ordered. Radiology: ordered.  Risk Decision regarding hospitalization.   ***  {Document critical care time when appropriate:1} {Document review of labs and clinical decision tools ie heart score, Chads2Vasc2 etc:1}  {Document your independent review of radiology images, and any outside records:1} {Document your discussion with family members, caretakers, and with consultants:1} {Document social determinants of health affecting pt's care:1} {Document your decision making why or why not admission, treatments were needed:1} Final Clinical Impression(s) / ED Diagnoses Final diagnoses:  End stage renal disease on dialysis [facility])  Multifocal pneumonia    Rx / DC Orders ED Discharge Orders     None

## 2022-11-18 NOTE — Telephone Encounter (Signed)
Kathryn Beck returning call about the hoyer lift, using a sliding board now in bed but would benefit with a hoyer lift. Patient uses one at her dialysis , request lift for hom. Call back # [phone].

## 2022-11-18 NOTE — ED Notes (Signed)
Spoke with dialysis nurse about when pt may be having hers. She stated roughly 1-1.5 hrs and she will message when she is closer or knows more info.

## 2022-11-18 NOTE — ED Provider Notes (Signed)
Patient has remained stable while in the ED waiting on dialysis.   Pricilla Loveless, MD 11/18/22 [phone]

## 2022-11-18 NOTE — ED Triage Notes (Signed)
Pt c/o shortness of breath. Pt is a dialysis pt and has missed that last two appointments. Also states that she has started having some tremors.   BKA pt   M W F is dialysis

## 2022-11-18 NOTE — ED Notes (Signed)
Patient transported to HD 

## 2022-11-19 DIAGNOSIS — R531 Weakness: Secondary | ICD-10-CM

## 2022-11-19 DIAGNOSIS — E1151 Type 2 diabetes mellitus with diabetic peripheral angiopathy without gangrene: Secondary | ICD-10-CM | POA: Diagnosis not present

## 2022-11-19 DIAGNOSIS — M62521 Muscle wasting and atrophy, not elsewhere classified, right upper arm: Secondary | ICD-10-CM | POA: Diagnosis not present

## 2022-11-19 DIAGNOSIS — M62562 Muscle wasting and atrophy, not elsewhere classified, left lower leg: Secondary | ICD-10-CM

## 2022-11-19 DIAGNOSIS — N186 End stage renal disease: Secondary | ICD-10-CM

## 2022-11-19 DIAGNOSIS — R0602 Shortness of breath: Secondary | ICD-10-CM | POA: Diagnosis not present

## 2022-11-19 DIAGNOSIS — E782 Mixed hyperlipidemia: Secondary | ICD-10-CM

## 2022-11-19 DIAGNOSIS — E1165 Type 2 diabetes mellitus with hyperglycemia: Secondary | ICD-10-CM

## 2022-11-19 DIAGNOSIS — Z794 Long term (current) use of insulin: Secondary | ICD-10-CM | POA: Diagnosis not present

## 2022-11-19 DIAGNOSIS — I120 Hypertensive chronic kidney disease with stage 5 chronic kidney disease or end stage renal disease: Secondary | ICD-10-CM | POA: Diagnosis not present

## 2022-11-19 DIAGNOSIS — I10 Essential (primary) hypertension: Secondary | ICD-10-CM

## 2022-11-19 DIAGNOSIS — J9601 Acute respiratory failure with hypoxia: Secondary | ICD-10-CM | POA: Diagnosis not present

## 2022-11-19 DIAGNOSIS — Z992 Dependence on renal dialysis: Secondary | ICD-10-CM | POA: Diagnosis not present

## 2022-11-19 DIAGNOSIS — M62522 Muscle wasting and atrophy, not elsewhere classified, left upper arm: Secondary | ICD-10-CM | POA: Diagnosis not present

## 2022-11-19 DIAGNOSIS — R41841 Cognitive communication deficit: Secondary | ICD-10-CM

## 2022-11-19 DIAGNOSIS — I132 Hypertensive heart and chronic kidney disease with heart failure and with stage 5 chronic kidney disease, or end stage renal disease: Secondary | ICD-10-CM | POA: Diagnosis not present

## 2022-11-19 DIAGNOSIS — I5032 Chronic diastolic (congestive) heart failure: Secondary | ICD-10-CM | POA: Diagnosis not present

## 2022-11-19 DIAGNOSIS — I739 Peripheral vascular disease, unspecified: Secondary | ICD-10-CM | POA: Diagnosis not present

## 2022-11-19 DIAGNOSIS — G894 Chronic pain syndrome: Secondary | ICD-10-CM

## 2022-11-19 DIAGNOSIS — Z4781 Encounter for orthopedic aftercare following surgical amputation: Secondary | ICD-10-CM | POA: Diagnosis not present

## 2022-11-19 DIAGNOSIS — E1122 Type 2 diabetes mellitus with diabetic chronic kidney disease: Secondary | ICD-10-CM | POA: Diagnosis not present

## 2022-11-19 DIAGNOSIS — D631 Anemia in chronic kidney disease: Secondary | ICD-10-CM | POA: Diagnosis not present

## 2022-11-19 DIAGNOSIS — N250 Renal osteodystrophy: Secondary | ICD-10-CM | POA: Diagnosis not present

## 2022-11-19 LAB — COMPREHENSIVE METABOLIC PANEL
ALT: 50 U/L — ABNORMAL HIGH (ref 0–44)
AST: 19 U/L (ref 15–41)
Albumin: 3.4 g/dL — ABNORMAL LOW (ref 3.5–5.0)
Alkaline Phosphatase: 105 U/L (ref 38–126)
Anion gap: 17 — ABNORMAL HIGH (ref 5–15)
BUN: 56 mg/dL — ABNORMAL HIGH (ref 8–23)
CO2: 26 mmol/L (ref 22–32)
Calcium: 8.9 mg/dL (ref 8.9–10.3)
Chloride: 98 mmol/L (ref 98–111)
Creatinine, Ser: 7.14 mg/dL — ABNORMAL HIGH (ref 0.44–1.00)
GFR, Estimated: 6 mL/min — ABNORMAL LOW (ref >60)
Glucose, Bld: 107 mg/dL — ABNORMAL HIGH (ref 70–99)
Potassium: 4 mmol/L (ref 3.5–5.1)
Sodium: 141 mmol/L (ref 135–145)
Total Bilirubin: 0.5 mg/dL (ref 0.3–1.2)
Total Protein: 6.9 g/dL (ref 6.5–8.1)

## 2022-11-19 LAB — CBC WITH DIFFERENTIAL/PLATELET
Abs Immature Granulocytes: 0.06 10*3/uL (ref 0.00–0.07)
Basophils Absolute: 0 10*3/uL (ref 0.0–0.1)
Basophils Relative: 0 %
Eosinophils Absolute: 0.1 10*3/uL (ref 0.0–0.5)
Eosinophils Relative: 1 %
HCT: 32.8 % — ABNORMAL LOW (ref 36.0–46.0)
Hemoglobin: 10.4 g/dL — ABNORMAL LOW (ref 12.0–15.0)
Immature Granulocytes: 1 %
Lymphocytes Relative: 15 %
Lymphs Abs: 1.2 10*3/uL (ref 0.7–4.0)
MCH: 25.4 pg — ABNORMAL LOW (ref 26.0–34.0)
MCHC: 31.7 g/dL (ref 30.0–36.0)
MCV: 80.2 fL (ref 80.0–100.0)
Monocytes Absolute: 0.5 10*3/uL (ref 0.1–1.0)
Monocytes Relative: 7 %
Neutro Abs: 6.2 10*3/uL (ref 1.7–7.7)
Neutrophils Relative %: 76 %
Platelets: 308 10*3/uL (ref 150–400)
RBC: 4.09 MIL/uL (ref 3.87–5.11)
RDW: 18.7 % — ABNORMAL HIGH (ref 11.5–15.5)
WBC: 8.1 10*3/uL (ref 4.0–10.5)
nRBC: 0.5 % — ABNORMAL HIGH (ref 0.0–0.2)

## 2022-11-19 LAB — MAGNESIUM: Magnesium: 2.1 mg/dL (ref 1.7–2.4)

## 2022-11-19 LAB — GLUCOSE, CAPILLARY: Glucose-Capillary: 177 mg/dL — ABNORMAL HIGH (ref 70–99)

## 2022-11-19 LAB — HEPATITIS B SURFACE ANTIBODY, QUANTITATIVE: Hep B S AB Quant (Post): 633 m[IU]/mL

## 2022-11-19 LAB — CBG MONITORING, ED: Glucose-Capillary: 158 mg/dL — ABNORMAL HIGH (ref 70–99)

## 2022-11-19 MED ORDER — PREGABALIN 50 MG PO CAPS
50.0000 mg | ORAL_CAPSULE | Freq: Every day | ORAL | Status: DC
  Administered 2022-11-19: 50 mg via ORAL
  Filled 2022-11-19: qty 1

## 2022-11-19 MED ORDER — DM-GUAIFENESIN ER 30-600 MG PO TB12: 1.0000 | ORAL_TABLET | Freq: Two times a day (BID) | ORAL | 0 refills | Status: DC | PRN

## 2022-11-19 MED ORDER — SERTRALINE HCL 50 MG PO TABS: 100.0000 mg | ORAL_TABLET | Freq: Every day | ORAL | Status: DC

## 2022-11-19 MED ORDER — PREGABALIN 50 MG PO CAPS: 50.0000 mg | ORAL_CAPSULE | Freq: Every day | ORAL | Status: DC

## 2022-11-19 MED ORDER — DM-GUAIFENESIN ER 30-600 MG PO TB12
1.0000 | ORAL_TABLET | Freq: Two times a day (BID) | ORAL | Status: DC
  Administered 2022-11-19: 1 via ORAL
  Filled 2022-11-19: qty 1

## 2022-11-19 NOTE — Assessment & Plan Note (Signed)
Continue Norvasc

## 2022-11-19 NOTE — Assessment & Plan Note (Signed)
Continue Zetia. °

## 2022-11-19 NOTE — Discharge Summary (Signed)
Physician Discharge Summary   Patient: Kathryn Beck MRN: [id number] DOB: 1955-08-20  Admit date:     11/18/2022  Discharge date: 11/19/22  Discharge Physician: Vassie Loll   PCP: [name], MD   Recommendations at discharge:  Repeat CBC to follow hemoglobin trend/stability Reassess blood pressure and adjust antihypertensive regimen as needed Continue to follow patient CBGs fluctuation and further adjustment to hypoglycemia regimen as required.  Discharge Diagnoses: Principal Problem:   Generalized weakness Active Problems:   ESRD on hemodialysis (HCC)   Type 2 diabetes mellitus with hyperglycemia (HCC)   HTN (hypertension)   PAD (peripheral artery disease) (HCC)   Hyperlipidemia  Brief Hospital admission narrative: As per H&P written by Dr.Zierle-Ghosh on 11/19/22 Kathryn Beck is a 67 y.o. female with medical history significant of acid reflux, depression, ESRD on HD, hyperlipidemia, hypertension, diabetes mellitus, memory loss, and more presents to the ED with a chief complaint of hurting all over.  Patient reports she is hurting all over but is unable to say where she hurts or how it hurts.  She is on Lyrica for fibromyalgia per her report.  She is a very poor historian.  When reminded that she said she was short of breath when she came in she says that that is true as well.  She is not sure if she is worse with exertion or not.  She reports that she has had these episodes of whole body tremors without being able to stop them.  This started a couple days ago.  This seemed to resolve spontaneously after patient reports she does still have some urine output each day about 2 times per day but it is very small.  She denies dysuria and hematuria.  Patient reports a couple missed sessions of dialysis.  She reports because she was feeling sick.  She is not able to say any of the symptoms she was feeling when she was sick.  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    Patient does not smoke does not drink.  Patient is full code.  Assessment and Plan: 1-acute respiratory failure with hypoxia -At time of admission requiring 2 L nasal cannula supplementation -Secondary to interstitial edema/vascular congestion -Patient condition stabilized/resolved after hemodialysis provided -Patient advised to follow low-sodium diet and to resume outpatient dialysis treatment as previously scheduled. -Nephrology service to closely follow her for adjustment to her dry weight as needed. -No requiring oxygen supplementation at discharge time.  2-generalized weakness -Physical therapy recommending home health PT. -Appears to be associated with missing hemodialysis and elevated BUN. -Resolved and is stable for discharge.  3-ESRD -Continue hemodialysis Monday, Wednesday and Friday as per outpatient schedule -Appreciate assistance and recommendation by nephrology service -Continue [name] -Low-sodium diet encouraged.  4-type 2 diabetes with nephropathy -Resume home hypoglycemic regimen -Modified carbohydrate diet discussed with patient.  5-hypertension -Continue the use of Norvasc -Heart healthy/low-sodium diet discussed with patient.  6-hyperlipidemia -Continue the use of Zetia -Heart healthy diet discussed with patient.  7-peripheral arterial disease -Continue risk factor modifications -Continue aspirin and cholesterol control -Status post bilateral BKA.  8-class II obesity -Body mass index is 35.18 kg/m. -Low-calorie diet and portion control has been discussed with patient.  Consultants: Nephrology service Procedures performed: See below for x-ray report. Disposition: Home with home health services. Diet recommendation: Heart healthy/low-sodium diet and modified carbohydrates.  DISCHARGE MEDICATION: Allergies as of 11/19/2022       Reactions   Ace Inhibitors Anaphylaxis, Swelling   Penicillins Itching, Swelling  Statins Other (See Comments)    elevated LFT's   Albuterol Swelling        Medication List     STOP taking these medications    mupirocin ointment 2 % Commonly known as: BACTROBAN   torsemide 100 MG tablet Commonly known as: DEMADEX       TAKE these medications    amLODipine 5 MG tablet Commonly known as: NORVASC Take 1 tablet (5 mg total) by mouth daily.   aspirin 81 MG chewable tablet CHEW TWO TABLETS BY MOUTH EVERY DAY What changed: See the new instructions.   calcitRIOL 0.25 MCG capsule Commonly known as: ROCALTROL TAKE 1 CAPSULE (0.25 MCG TOTAL) BY MOUTH 2 (TWO) TIMES DAILY WITH A MEAL.   cinacalcet 30 MG tablet Commonly known as: SENSIPAR Take 3 tablets (90 mg total) by mouth 3 (three) times a week.   dextromethorphan-guaiFENesin 30-600 MG 12hr tablet Commonly known as: MUCINEX DM Take 1 tablet by mouth 2 (two) times daily as needed for cough.   DULoxetine 60 MG capsule Commonly known as: CYMBALTA TAKE (1) CAPSULE BY MOUTH ONCE DAILY. What changed: See the new instructions.   ezetimibe 10 MG tablet Commonly known as: ZETIA Take 1 tablet (10 mg total) by mouth daily.   Fiasp FlexTouch 100 UNIT/ML FlexTouch Pen Generic drug: insulin aspart INJECT 18 TO 30 UNITS INTO THE SKIN WITH BREAKFAST, WITH LUNCH, AND WITH EVENING MEAL What changed: See the new instructions.   FreeStyle Libre 2 Reader [place] 1 each by Does not apply route daily.   FreeStyle Libre 2 Sensor Misc 1 each by Does not apply route every 14 (fourteen) days.   hydrOXYzine 25 MG tablet Commonly known as: ATARAX Take 25 mg by mouth every 12 (twelve) hours as needed for itching.   insulin aspart 100 UNIT/ML injection Commonly known as: novoLOG Inject 1-10 Units into the skin 3 (three) times daily before meals. Sliding scale 101-150=1 units 151-200=2 units 201-250=4 units 251-300=6 units 301-350=8 units 351-450=10 units Above 450 call MD   Insulin Pen Needle 32G X 4 MM Misc Use 4x a day   metoprolol  succinate 50 MG 24 hr tablet Commonly known as: TOPROL-XL TAKE (1) TABLET BY MOUTH DAILY WITH FOOD *TAKE AFTER DIALYSIS* What changed:  how much to take how to take this when to take this additional instructions   OneTouch Delica Lancets 33G Misc Use to check blood sugar 4X daily.   OneTouch Verio test strip Generic drug: glucose blood Use as instructed to check blood sugar 4X daily.   OneTouch Verio w/Device Kit Use to check blood sugar 4X daily.   oxyCODONE-acetaminophen 5-325 MG tablet Commonly known as: PERCOCET/ROXICET Take 1 tablet by mouth every 4 (four) hours as needed.   pregabalin 50 MG capsule Commonly known as: LYRICA Take 1 capsule (50 mg total) by mouth daily. What changed: when to take this   Rena-Vite Rx 1 MG Tabs Take 1 tablet by mouth daily.   sertraline 50 MG tablet Commonly known as: ZOLOFT Take 2 tablets (100 mg total) by mouth daily.   Toujeo Max SoloStar 300 UNIT/ML Solostar Pen Generic drug: insulin glargine (2 Unit Dial) Inject 30 Units into the skin daily. May need to slowly increase the dose depending upon your blood sugar, follow-up with PCP   [name] 500 MG chewable tablet Generic drug: sucroferric oxyhydroxide Chew 500-1,000 mg by mouth See admin instructions. Take 1000mg  (2 tablets) by mouth with meals and 500mg  (1 tablet) with snacks  Follow-up Information     [name], MD. Schedule an appointment as soon as possible for a visit in 10 day(s).   Specialty: Family Medicine Contact information: [address], [address] [place] [address] [phone]                Discharge Exam: Ceasar Mons Weights   11/18/22 1410 11/18/22 1825  Weight: 85.5 kg 81.7 kg   General exam: Alert, awake, oriented x 3 Respiratory system: Clear to auscultation. Respiratory effort normal.  No using accessory muscle.  Good saturation on room air. Cardiovascular system:RRR. No murmurs, rubs, gallops. Gastrointestinal system:  Abdomen is obese, nondistended, soft and nontender. No organomegaly or masses felt. Normal bowel sounds heard. Central nervous system: No focal neurological deficits. Extremities: No significant edema appreciated; bilateral BKA appreciated. Skin: No petechiae. Psychiatry: Judgement and insight appear normal. Mood & affect appropriate.    Condition at discharge: Stable and improved.  The results of significant diagnostics from this hospitalization (including imaging, microbiology, ancillary and laboratory) are listed below for reference.   Imaging Studies: CT Chest Wo Contrast  Result Date: 11/18/2022 CLINICAL DATA:  Pneumonia, complication suspected, xray done, short of breath after dialysis EXAM: CT CHEST WITHOUT CONTRAST TECHNIQUE: Multidetector CT imaging of the chest was performed following the standard protocol without IV contrast. RADIATION DOSE REDUCTION: This exam was performed according to the departmental dose-optimization program which includes automated exposure control, adjustment of the mA and/or kV according to patient size and/or use of iterative reconstruction technique. COMPARISON:  11/18/2022, 09/03/2018 FINDINGS: Cardiovascular: Unenhanced imaging of the heart demonstrates cardiomegaly without pericardial effusion. Calcification of the aortic and mitral valves. Normal caliber of the thoracic aorta. Extensive atherosclerosis of the aorta and coronary vasculature. Evaluation of the vascular lumen is limited without IV contrast. Mediastinum/Nodes: No enlarged mediastinal or axillary lymph nodes. Thyroid gland, trachea, and esophagus demonstrate no significant findings. Lungs/Pleura: There are patchy bilateral areas of ground-glass airspace disease, most pronounced within the right upper lobe. Findings could reflect asymmetric edema or multifocal infection, including atypical infection such as viral etiology. No effusion or pneumothorax. Central airways are patent. Upper Abdomen: Stable  bilateral low-attenuation adrenal thickening, consistent with hyperplasia or adenomas. No acute upper abdominal findings. Musculoskeletal: No acute or destructive bony abnormalities. Reconstructed images demonstrate no additional findings. IMPRESSION: 1. Patchy bilateral ground-glass airspace disease, which may reflect asymmetric edema or multifocal infection including atypical organisms such as viral etiology. 2. Cardiomegaly without pericardial effusion. 3. Aortic Atherosclerosis (ICD10-I70.0). Coronary artery atherosclerosis. Electronically Signed   By: Sharlet Salina M.D.   On: 11/18/2022 20:38   DG Chest Portable 1 View  Result Date: 11/18/2022 CLINICAL DATA:  Pulmonary edema.  Shortness of breath. EXAM: PORTABLE CHEST 1 VIEW COMPARISON:  08/13/2022 FINDINGS: Unchanged enlargement of the cardiac silhouette. Aortic atherosclerosis. Increased interstitial markings are identified bilaterally. New asymmetric opacification within the periphery of the left base may represent infiltrate or pleural effusion. Patchy opacities within the right upper and right lower lung. IMPRESSION: 1. New asymmetric opacification within the periphery of the left base may represent infiltrate and/or pleural effusion. 2. Patchy opacities within the right upper and right lower lung may represent atelectasis or infiltrate. 3. Increased interstitial markings compatible with pulmonary edema. Electronically Signed   By: Signa Kell M.D.   On: 11/18/2022 06:43    Microbiology: Results for orders placed or performed during the hospital encounter of 11/18/22  SARS Coronavirus 2 by RT PCR (hospital order, performed in [facility] hospital lab) *  cepheid single result test* Anterior Nasal Swab     Status: None   Collection Time: 11/18/22  6:19 AM   Specimen: Anterior Nasal Swab  Result Value Ref Range Status   SARS Coronavirus 2 by RT PCR NEGATIVE NEGATIVE Final    Comment: (NOTE) SARS-CoV-2 target nucleic acids are NOT  DETECTED.  The SARS-CoV-2 RNA is generally detectable in upper and lower respiratory specimens during the acute phase of infection. The lowest concentration of SARS-CoV-2 viral copies this assay can detect is 250 copies / mL. A negative result does not preclude SARS-CoV-2 infection and should not be used as the sole basis for treatment or other patient management decisions.  A negative result may occur with improper specimen collection / handling, submission of specimen other than nasopharyngeal swab, presence of viral mutation(s) within the areas targeted by this assay, and inadequate number of viral copies (<250 copies / mL). A negative result must be combined with clinical observations, patient history, and epidemiological information.  Fact Sheet for Patients:   RoadLapTop.co.za  Fact Sheet for Healthcare Providers: http://kim-miller.com/  This test is not yet approved or  cleared by the [place] FDA and has been authorized for detection and/or diagnosis of SARS-CoV-2 by FDA under an Emergency Use Authorization (EUA).  This EUA will remain in effect (meaning this test can be used) for the duration of the COVID-19 declaration under Section 564(b)(1) of the Act, 21 U.S.C. section 360bbb-3(b)(1), unless the authorization is terminated or revoked sooner.  Performed at [facility], [address] [place], [place] [address]    *Note: Due to a large number of results and/or encounters for the requested time period, some results have not been displayed. A complete set of results can be found in Results Review.    Labs: CBC: Recent Labs  Lab 11/18/22 0551 11/19/22 0320  WBC 7.2 8.1  NEUTROABS 5.6 6.2  HGB 9.7* 10.4*  HCT 33.0* 32.8*  MCV 83.8 80.2  PLT 289 308   Basic Metabolic Panel: Recent Labs  Lab 11/18/22 0551 11/19/22 0320  NA 145 141  K 5.3* 4.0  CL 107 98  CO2 19* 26  GLUCOSE 225* 107*  BUN 98* 56*   CREATININE 11.36* 7.14*  CALCIUM 9.2 8.9  MG  --  2.1   Liver Function Tests: Recent Labs  Lab 11/19/22 0320  AST 19  ALT 50*  ALKPHOS 105  BILITOT 0.5  PROT 6.9  ALBUMIN 3.4*   CBG: Recent Labs  Lab 11/18/22 2245 11/19/22 1109  GLUCAP 158* 177*    Discharge time spent: greater than 30 minutes.  Signed: Vassie Loll, MD [facility] 11/19/2022

## 2022-11-19 NOTE — Assessment & Plan Note (Signed)
-   Continue reduced dose of basal insulin - Continue sliding scale coverage - Continue to monitor

## 2022-11-19 NOTE — Assessment & Plan Note (Signed)
-   Monday Wednesday Friday dialysis - Consult nephrology - Continue [name] - Continue to monitor

## 2022-11-19 NOTE — Assessment & Plan Note (Signed)
-   PT eval and treat - No infectious symptoms

## 2022-11-19 NOTE — Evaluation (Signed)
Physical Therapy Evaluation Patient Details Name: Kathryn Beck MRN: [id number] DOB: 11-Aug-1955 Today's Date: 11/19/2022  History of Present Illness  Kathryn Beck is a 67 y.o. female with medical history significant of acid reflux, depression, ESRD on HD, hyperlipidemia, hypertension, diabetes mellitus, memory loss, and more presents to the ED with a chief complaint of hurting all over.  Patient reports she is hurting all over but is unable to say where she hurts or how it hurts.  She is on Lyrica for fibromyalgia per her report.  She is a very poor historian.  When reminded that she said she was short of breath when she came in she says that that is true as well.  She is not sure if she is worse with exertion or not.  She reports that she has had these episodes of whole body tremors without being able to stop them.  This started a couple days ago.  This seemed to resolve spontaneously after patient reports she does still have some urine output each day about 2 times per day but it is very small.  She denies dysuria and hematuria.  Patient reports a couple missed sessions of dialysis.  She reports because she was feeling sick.  She is not able to say any of the symptoms she was feeling when she was sick.  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   Clinical Impression  Patient functioning near baseline for functional mobility and gait other than having seizure like trembling of extremities once seated at bedside, otherwise demonstrates good return for sitting up with HOB partially raised and scooting forward/laterally at bedside without loss of sitting balance.  Patient tolerated long sitting in bed with family members present after therapy.  PLAN:  Patient to be discharged home today and discharged from acute physical therapy to care of nursing for bed mobility and transfers as tolerated for length of stay with recommendations stated below.          If plan is discharge home, recommend  the following: A little help with walking and/or transfers;A little help with bathing/dressing/bathroom;Help with stairs or ramp for entrance;Assistance with cooking/housework   Can travel by private vehicle        Equipment Recommendations None recommended by PT  Recommendations for Other Services       Functional Status Assessment Patient has had a recent decline in their functional status and demonstrates the ability to make significant improvements in function in a reasonable and predictable amount of time.     Precautions / Restrictions Precautions Precautions: Fall Restrictions Weight Bearing Restrictions: No      Mobility  Bed Mobility Overal bed mobility: Needs Assistance Bed Mobility: Supine to Sit, Sit to Supine     Supine to sit: Min assist Sit to supine: Min assist   General bed mobility comments: slow labored movement with seizure like movement in extremities once seated    Transfers Overall transfer level: Needs assistance Equipment used: None               General transfer comment: demonstrates fair/good return for scooting laterally at bedside    Ambulation/Gait                  Stairs            Wheelchair Mobility     Tilt Bed    Modified Rankin (Stroke Patients Only)       Balance Overall balance assessment: Needs assistance Sitting-balance support: Feet unsupported,  No upper extremity supported Sitting balance-Leahy Scale: Fair Sitting balance - Comments: seated at EOB, fair/good with BUE supported                                     Pertinent Vitals/Pain Pain Assessment Pain Assessment: No/denies pain    Home Living Family/patient expects to be discharged to:: Private residence Living Arrangements: Children Available Help at Discharge: Family;Available PRN/intermittently Type of Home: House Home Access: Stairs to enter Entrance Stairs-Rails: None Entrance Stairs-Number of Steps: 2   Home  Layout: Able to live on main level with bedroom/bathroom Home Equipment: BSC/3in1;Wheelchair - Forensic psychologist (2 wheels);Cane - single point;Grab bars - tub/shower      Prior Function Prior Level of Function : Needs assist             Mobility Comments: Assisted transfers using sliding board to w/c and bathroom ADLs Comments: family assisted with set up for bathing, pt able to sponge bathe independently and use bathroom     Extremity/Trunk Assessment   Upper Extremity Assessment Upper Extremity Assessment: Generalized weakness    Lower Extremity Assessment Lower Extremity Assessment: Generalized weakness    Cervical / Trunk Assessment Cervical / Trunk Assessment: Normal  Communication   Communication Communication: No apparent difficulties  Cognition Arousal: Alert Behavior During Therapy: WFL for tasks assessed/performed Overall Cognitive Status: Within Functional Limits for tasks assessed                                          General Comments      Exercises     Assessment/Plan    PT Assessment All further PT needs can be met in the next venue of care  PT Problem List Decreased strength;Decreased activity tolerance;Decreased balance;Decreased mobility       PT Treatment Interventions      PT Goals (Current goals can be found in the Care Plan section)  Acute Rehab PT Goals Patient Stated Goal: return home with family to assist PT Goal Formulation: With patient/family Time For Goal Achievement: 11/19/22 Potential to Achieve Goals: Good    Frequency       Co-evaluation               AM-PAC PT "6 Clicks" Mobility  Outcome Measure Help needed turning from your back to your side while in a flat bed without using bedrails?: A Little Help needed moving from lying on your back to sitting on the side of a flat bed without using bedrails?: A Little Help needed moving to and from a bed to a chair (including a wheelchair)?: A  Lot Help needed standing up from a chair using your arms (e.g., wheelchair or bedside chair)?: Total Help needed to walk in hospital room?: Total Help needed climbing 3-5 steps with a railing? : Total 6 Click Score: 11    End of Session   Activity Tolerance: Patient tolerated treatment well;Patient limited by fatigue Patient left: in bed;with call bell/phone within reach;with family/visitor present Nurse Communication: Mobility status PT Visit Diagnosis: Muscle weakness (generalized) (M62.81);Other abnormalities of gait and mobility (R26.89);Difficulty in walking, not elsewhere classified (R26.2)    Time: 1610-9604 PT Time Calculation (min) (ACUTE ONLY): 21 min   Charges:   PT Evaluation $PT Eval Moderate Complexity: 1 Mod PT Treatments $Therapeutic Activity: 8-22 mins  PT General Charges $$ ACUTE PT VISIT: 1 Visit         2:48 PM, 11/19/22 Ocie Bob, MPT Physical Therapist with [facility] [phone] office [phone] mobile phone

## 2022-11-19 NOTE — Progress Notes (Addendum)
Patient ID: Kathryn Beck, female   DOB: Jan 31, 1956, 67 y.o.   MRN: [id number] S: Feeling better this morning.  Tolerated HD well yesterday with UF of 3.8 L. O:BP (!) 149/66   Pulse 70   Temp 97.9 F (36.6 C)   Resp 14   Wt 81.7 kg   SpO2 97%   BMI 30.92 kg/m   Intake/Output Summary (Last 24 hours) at 11/19/2022 1043 Last data filed at 11/18/2022 2042 Gross per 24 hour  Intake 100 ml  Output 3800 ml  Net -3700 ml   Intake/Output: I/O last 3 completed shifts: In: 100 [IV Piggyback:100] Out: 3800 [Other:3800]  Intake/Output this shift:  No intake/output data recorded. Weight change:  Gen: NAD CVS: RRR Resp:CTA Abd: +BS,soft, NT/ND Ext: s/p bilateral BKA's. LUE AVF +T/B  Recent Labs  Lab 11/18/22 0551 11/19/22 0320  NA 145 141  K 5.3* 4.0  CL 107 98  CO2 19* 26  GLUCOSE 225* 107*  BUN 98* 56*  CREATININE 11.36* 7.14*  ALBUMIN  --  3.4*  CALCIUM 9.2 8.9  AST  --  19  ALT  --  50*   Liver Function Tests: Recent Labs  Lab 11/19/22 0320  AST 19  ALT 50*  ALKPHOS 105  BILITOT 0.5  PROT 6.9  ALBUMIN 3.4*   No results for input(s): "LIPASE", "AMYLASE" in the last 168 hours. Recent Labs  Lab 11/18/22 0630  AMMONIA 31   CBC: Recent Labs  Lab 11/18/22 0551 11/19/22 0320  WBC 7.2 8.1  NEUTROABS 5.6 6.2  HGB 9.7* 10.4*  HCT 33.0* 32.8*  MCV 83.8 80.2  PLT 289 308   Cardiac Enzymes: No results for input(s): "CKTOTAL", "CKMB", "CKMBINDEX", "TROPONINI" in the last 168 hours. CBG: Recent Labs  Lab 11/18/22 2245  GLUCAP 158*    Iron Studies: No results for input(s): "IRON", "TIBC", "TRANSFERRIN", "FERRITIN" in the last 72 hours. Studies/Results: CT Chest Wo Contrast  Result Date: 11/18/2022 CLINICAL DATA:  Pneumonia, complication suspected, xray done, short of breath after dialysis EXAM: CT CHEST WITHOUT CONTRAST TECHNIQUE: Multidetector CT imaging of the chest was performed following the standard protocol without IV contrast. RADIATION DOSE  REDUCTION: This exam was performed according to the departmental dose-optimization program which includes automated exposure control, adjustment of the mA and/or kV according to patient size and/or use of iterative reconstruction technique. COMPARISON:  11/18/2022, 09/03/2018 FINDINGS: Cardiovascular: Unenhanced imaging of the heart demonstrates cardiomegaly without pericardial effusion. Calcification of the aortic and mitral valves. Normal caliber of the thoracic aorta. Extensive atherosclerosis of the aorta and coronary vasculature. Evaluation of the vascular lumen is limited without IV contrast. Mediastinum/Nodes: No enlarged mediastinal or axillary lymph nodes. Thyroid gland, trachea, and esophagus demonstrate no significant findings. Lungs/Pleura: There are patchy bilateral areas of ground-glass airspace disease, most pronounced within the right upper lobe. Findings could reflect asymmetric edema or multifocal infection, including atypical infection such as viral etiology. No effusion or pneumothorax. Central airways are patent. Upper Abdomen: Stable bilateral low-attenuation adrenal thickening, consistent with hyperplasia or adenomas. No acute upper abdominal findings. Musculoskeletal: No acute or destructive bony abnormalities. Reconstructed images demonstrate no additional findings. IMPRESSION: 1. Patchy bilateral ground-glass airspace disease, which may reflect asymmetric edema or multifocal infection including atypical organisms such as viral etiology. 2. Cardiomegaly without pericardial effusion. 3. Aortic Atherosclerosis (ICD10-I70.0). Coronary artery atherosclerosis. Electronically Signed   By: Sharlet Salina M.D.   On: 11/18/2022 20:38   DG Chest Portable 1 View  Result Date: 11/18/2022 CLINICAL DATA:  Pulmonary edema.  Shortness of breath. EXAM: PORTABLE CHEST 1 VIEW COMPARISON:  08/13/2022 FINDINGS: Unchanged enlargement of the cardiac silhouette. Aortic atherosclerosis. Increased interstitial  markings are identified bilaterally. New asymmetric opacification within the periphery of the left base may represent infiltrate or pleural effusion. Patchy opacities within the right upper and right lower lung. IMPRESSION: 1. New asymmetric opacification within the periphery of the left base may represent infiltrate and/or pleural effusion. 2. Patchy opacities within the right upper and right lower lung may represent atelectasis or infiltrate. 3. Increased interstitial markings compatible with pulmonary edema. Electronically Signed   By: Signa Kell M.D.   On: 11/18/2022 06:43    amLODipine  5 mg Oral Daily   aspirin  162 mg Oral Daily   Chlorhexidine Gluconate Cloth  6 each Topical Q0600   DULoxetine  60 mg Oral Daily   ezetimibe  10 mg Oral Daily   heparin  5,000 Units Subcutaneous Q8H   insulin aspart  1-10 Units Subcutaneous TID AC   insulin glargine-yfgn  10 Units Subcutaneous QHS   metoprolol succinate  50 mg Oral Daily   pregabalin  50 mg Oral Daily   sertraline  100 mg Oral Daily   sucroferric oxyhydroxide  1,000 mg Oral TID WC    BMET    Component Value Date/Time   NA 141 11/19/2022 0320   NA 142 07/07/2022 1140   K 4.0 11/19/2022 0320   CL 98 11/19/2022 0320   CO2 26 11/19/2022 0320   GLUCOSE 107 (H) 11/19/2022 0320   BUN 56 (H) 11/19/2022 0320   BUN 19 07/07/2022 1140   CREATININE 7.14 (H) 11/19/2022 0320   CREATININE 3.74 (H) 08/23/2017 1407   CALCIUM 8.9 11/19/2022 0320   CALCIUM 9.4 10/15/2017 1258   GFRNONAA 6 (L) 11/19/2022 0320   GFRNONAA 12 (L) 08/23/2017 1407   GFRAA 8 (L) 07/19/2019 1210   GFRAA 14 (L) 08/23/2017 1407   CBC    Component Value Date/Time   WBC 8.1 11/19/2022 0320   RBC 4.09 11/19/2022 0320   HGB 10.4 (L) 11/19/2022 0320   HGB 9.9 (L) 07/07/2022 1140   HCT 32.8 (L) 11/19/2022 0320   HCT 33.2 (L) 07/07/2022 1140   PLT 308 11/19/2022 0320   PLT 270 07/07/2022 1140   MCV 80.2 11/19/2022 0320   MCV 81 07/07/2022 1140   MCH 25.4 (L)  11/19/2022 0320   MCHC 31.7 11/19/2022 0320   RDW 18.7 (H) 11/19/2022 0320   RDW 15.9 (H) 07/07/2022 1140   LYMPHSABS 1.2 11/19/2022 0320   LYMPHSABS 2.0 07/07/2022 1140   MONOABS 0.5 11/19/2022 0320   EOSABS 0.1 11/19/2022 0320   EOSABS 0.1 07/07/2022 1140   BASOSABS 0.0 11/19/2022 0320   BASOSABS 0.1 07/07/2022 1140    Dialysis Orders: Center:  MWF at [facility] 3:45hr, 400/500, EDW 80g, 2K/2.5Ca, UFP#2, LUE AVF 15g, heparin 1000 unit initial + 1000 unit/hr Meds: Mircera 60 mcg every 2 weeks, Venofer 50 mg once weekly, calcitriol 3 mcg every treatment, Sensipar 90 mg every treatment   Assessment/Plan:  Acute hypoxic respiratory failure - resolved with HD and UF of 3.8 L.  Now on room air.  Likely that this was fluid overload given her rapid improvement with HD.    Myoclonic jerking - likely combination of Lyrica and missed HD.   Should be on Lyrica 50 mg daily not tid which her daughter reported that she was receiving.  ESRD -  plan for HD tomorrow to keep  on schedule.  Hypertension/volume  - UF with HD and follow bp and oxygen requirements  Anemia  - Hgb improved to 10.4  Metabolic bone disease -  continue with home meds  Nutrition - renal diet, carb modified.  Weakness - pt was just discharged from SNF and was getting home PT.  Will need to continue with PT to help with transferring into her wheelchair.  Should be better now that she is off of Lyrica.  Irena Cords, MD [facility]

## 2022-11-19 NOTE — H&P (Signed)
History and Physical    Patient: Kathryn Beck OZH:[id number] DOB: [birth year] DOA: 11/18/2022 DOS: the patient was seen and examined on 11/19/2022 PCP: [name], MD  Patient coming from: Home  Chief Complaint:  Chief Complaint  Patient presents with   Shortness of Breath   HPI: Kathryn Beck is a 67 y.o. female with medical history significant of acid reflux, depression, ESRD on HD, hyperlipidemia, hypertension, diabetes mellitus, memory loss, and more presents to the ED with a chief complaint of hurting all over.  Patient reports she is hurting all over but is unable to say where she hurts or how it hurts.  She is on Lyrica for fibromyalgia per her report.  She is a very poor historian.  When reminded that she said she was short of breath when she came in she says that that is true as well.  She is not sure if she is worse with exertion or not.  She reports that she has had these episodes of whole body tremors without being able to stop them.  This started a couple days ago.  This seemed to resolve spontaneously after patient reports she does still have some urine output each day about 2 times per day but it is very small.  She denies dysuria and hematuria.  Patient reports a couple missed sessions of dialysis.  She reports because she was feeling sick.  She is not able to say any of the symptoms she was feeling when she was sick.  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  Patient does not smoke does not drink.  Patient is full code. Review of Systems: As mentioned in the history of present illness. All other systems reviewed and are negative. Past Medical History:  Diagnosis Date   Acid reflux    Amputated left leg (HCC)    Anemia    Arthritis    Axillary masses    Soft tissue - status post excision   Back pain    Cancer (HCC)    CHF (congestive heart failure) (HCC)    COVID-19 virus infection 04/06/2019   Depression    End-stage renal disease (HCC)    M/W/F  dialysis   Essential hypertension    Headache    years ago   History of blood transfusion    History of cardiac catheterization    Normal coronary arteries October 2020   History of claustrophobia    History of pneumonia 2019   Hypoxia 04/03/2019   Memory loss    Mixed hyperlipidemia    Obesity    Pancreatitis    Peritoneal dialysis catheter in place [facility])    Pneumonia due to COVID-19 virus 04/02/2019   Sleep apnea    Noncompliant with CPAP   Stroke (HCC)    mini stroke   Type 2 diabetes mellitus (HCC)    Past Surgical History:  Procedure Laterality Date   ABDOMINAL AORTOGRAM W/LOWER EXTREMITY N/A 04/30/2022   Procedure: ABDOMINAL AORTOGRAM W/LOWER EXTREMITY;  Surgeon: Leonie Douglas, MD;  Location: [facility] INVASIVE CV LAB;  Service: Cardiovascular;  Laterality: N/A;   ABDOMINAL AORTOGRAM W/LOWER EXTREMITY N/A 07/21/2022   Procedure: ABDOMINAL AORTOGRAM W/LOWER EXTREMITY;  Surgeon: Nada Libman, MD;  Location: [facility] INVASIVE CV LAB;  Service: Cardiovascular;  Laterality: N/A;   ABDOMINAL HYSTERECTOMY     ACHILLES TENDON LENGTHENING  08/15/2022   Procedure: ACHILLES TENDON LENGTHENING;  Surgeon: [name], DPM;  Location: [facility] OR;  Service: Podiatry;;   AMPUTATION Right  05/29/2022   Procedure: RIGHT BELOW THE KNEE AMPUTATION;  Surgeon: [name], MD;  [facility];  Service: Orthopedics;  Laterality: Right;   AMPUTATION Left 09/04/2022   Procedure: AMPUTATION FOOT, serial irrigation;  Surgeon: Louann Sjogren, DPM;  Location: [facility] OR;  Service: Podiatry;  Laterality: Left;  Surgical team to do block   AMPUTATION Left 10/07/2022   Procedure: LEFT BELOW KNEE AMPUTATION;  Surgeon: [name], MD;  Location: [facility] OR;  Service: Orthopedics;  Laterality: Left;   AV FISTULA PLACEMENT Left 09/02/2017   Procedure: creation of left arm ARTERIOVENOUS (AV) FISTULA;  Surgeon: Nada Libman, MD;  Location: [facility] OR;  Service: Vascular;  Laterality: Left;   COLONOSCOPY  2008   Dr. Darrick Penna:  normal    COLONOSCOPY N/A 12/18/2016   Dr. Darrick Penna: multiple tubular adenomas, internal hemorrhoids. Surveillance in 3 years    ESOPHAGEAL DILATION N/A 10/13/2015   Procedure: ESOPHAGEAL DILATION;  Surgeon: Malissa Hippo, MD;  [facility];  Service: Endoscopy;  Laterality: N/A;   ESOPHAGOGASTRODUODENOSCOPY N/A 10/13/2015   Dr. Karilyn Cota: chronic gastritis on path, no H.pylori. Empiric dilation    ESOPHAGOGASTRODUODENOSCOPY N/A 12/18/2016   Dr. Darrick Penna: mild gastritis. BRAVO study revealed uncontrolled GERD. Dysphagia secondary to uncontrolled reflux   FOOT SURGERY Bilateral    "nerve"     LEFT HEART CATH AND CORONARY ANGIOGRAPHY N/A 12/29/2018   Procedure: LEFT HEART CATH AND CORONARY ANGIOGRAPHY;  Surgeon: Corky Crafts, MD;  Location: [facility] INVASIVE CV LAB;  Service: Cardiovascular;  Laterality: N/A;   LOWER EXTREMITY ANGIOGRAPHY Right 05/04/2022   Procedure: Lower Extremity Angiography;  Surgeon: Victorino Sparrow, MD;  Location: [facility] INVASIVE CV LAB;  Service: Cardiovascular;  Laterality: Right;   LUNG BIOPSY     MASS EXCISION Right 01/09/2013   Procedure: EXCISION OF NEOPLASM OF RIGHT  AXILLA  AND EXCISION OF NEOPLASM OF LEFT AXILLA;  Surgeon: Dalia Heading, MD;  Location: [facility] ORS;  Service: General;  Laterality: Right;  procedure end @ 08:23   MYRINGOTOMY WITH TUBE PLACEMENT Bilateral 04/28/2017   Procedure: BILATERAL MYRINGOTOMY WITH TUBE PLACEMENT;  Surgeon: Newman Pies, MD;  Location: [facility] OR;  Service: ENT;  Laterality: Bilateral;   PERIPHERAL VASCULAR BALLOON ANGIOPLASTY Right 05/04/2022   Procedure: PERIPHERAL VASCULAR BALLOON ANGIOPLASTY;  Surgeon: Victorino Sparrow, MD;  Location: [facility] INVASIVE CV LAB;  Service: Cardiovascular;  Laterality: Right;  PT   PERIPHERAL VASCULAR INTERVENTION Right 05/04/2022   Procedure: PERIPHERAL VASCULAR INTERVENTION;  Surgeon: Victorino Sparrow, MD;  Location[facility] INVASIVE CV LAB;  Service: Cardiovascular;  Laterality: Right;  SFA   PERIPHERAL VASCULAR  INTERVENTION Left 07/21/2022   Procedure: PERIPHERAL VASCULAR INTERVENTION;  Surgeon: Nada Libman, MD;  [facility];  Service: Cardiovascular;  Laterality: Left;   REVISION OF ARTERIOVENOUS GORETEX GRAFT Left 05/04/2018   Procedure: TRANSPOSITION OF CEPHALIC VEIN ARTERIOVENOUS FISTULA LEFT ARM;  Surgeon: Larina Earthly, MD;  Location: [facility] OR;  Service: Vascular;  Laterality: Left;   SAVORY DILATION N/A 12/18/2016   Procedure: SAVORY DILATION;  Surgeon: West Bali, MD;  [facility];  Service: Endoscopy;  Laterality: N/A;   TRANSMETATARSAL AMPUTATION Left 08/15/2022   Procedure: TRANSMETATARSAL AMPUTATION;  Surgeon: [name], DPM;  Location: [facility] OR;  Service: Podiatry;  Laterality: Left;   Social History:  reports that she has never smoked. She has never been exposed to tobacco smoke. She has never used smokeless tobacco. She reports that she does not  drink alcohol and does not use drugs.  Allergies  Allergen Reactions   Ace Inhibitors Anaphylaxis and Swelling   Penicillins Itching and Swelling   Statins Other (See Comments)    elevated LFT's     Albuterol Swelling    Family History  Problem Relation Age of Onset   Hypertension Father    Hypercholesterolemia Father    Arthritis Father    Hypertension Sister    Hypercholesterolemia Sister    Breast cancer Sister    Hypertension Sister    Colon cancer Neg Hx    Colon polyps Neg Hx     Prior to Admission medications   Medication Sig Start Date End Date Taking? Authorizing Provider  amLODipine (NORVASC) 5 MG tablet Take 1 tablet (5 mg total) by mouth daily. 11/12/22   Anabel Halon, MD  aspirin 81 MG chewable tablet CHEW TWO TABLETS BY MOUTH EVERY DAY Patient taking differently: Chew 162 mg by mouth daily. 07/24/22   [name], MD  azelastine (OPTIVAR) 0.05 % ophthalmic solution Place 1 drop into both eyes 2 (two) times daily. Patient taking differently: Place 1 drop into both eyes 2  (two) times daily as needed (allergies). 05/25/22   [name], MD  B Complex-C-Folic Acid (RENA-VITE RX) 1 MG TABS Take 1 tablet by mouth daily. Patient not taking: Reported on 11/02/2022 04/15/22   [provider]  Blood Glucose Monitoring Suppl (ONETOUCH VERIO) w/Device KIT Use to check blood sugar 4X daily. 04/16/22   Carlus Pavlov, MD  calcitRIOL (ROCALTROL) 0.25 MCG capsule TAKE 1 CAPSULE (0.25 MCG TOTAL) BY MOUTH 2 (TWO) TIMES DAILY WITH A MEAL. Patient not taking: Reported on 11/02/2022 06/05/22   Roma Kayser, MD  cinacalcet (SENSIPAR) 30 MG tablet Take 3 tablets (90 mg total) by mouth 3 (three) times a week. Patient not taking: Reported on 11/02/2022 09/09/22   Marguerita Merles Latif, DO  Continuous Blood Gluc Receiver (FREESTYLE LIBRE 2 READER) DEVI 1 each by Does not apply route daily. 04/16/22   Carlus Pavlov, MD  Continuous Blood Gluc Sensor (FREESTYLE LIBRE 2 SENSOR) MISC 1 each by Does not apply route every 14 (fourteen) days. 04/16/22   Carlus Pavlov, MD  DULoxetine (CYMBALTA) 60 MG capsule TAKE (1) CAPSULE BY MOUTH ONCE DAILY. Patient taking differently: Take 60 mg by mouth daily. 09/17/20   [name], MD  ezetimibe (ZETIA) 10 MG tablet Take 1 tablet (10 mg total) by mouth daily. 05/25/22   [name], MD  FIASP FLEXTOUCH 100 UNIT/ML FlexTouch Pen INJECT 18 TO 30 UNITS INTO THE SKIN WITH BREAKFAST, WITH LUNCH, AND WITH EVENING MEAL 10/20/22   Carlus Pavlov, MD  glucose blood (ONETOUCH VERIO) test strip Use as instructed to check blood sugar 4X daily. 04/16/22   Carlus Pavlov, MD  hydrOXYzine (ATARAX) 25 MG tablet Take 25 mg by mouth every 12 (twelve) hours as needed for itching.    [provider]  insulin aspart (NOVOLOG) 100 UNIT/ML injection Inject 1-10 Units into the skin 3 (three) times daily before meals. Sliding scale 101-150=1 units 151-200=2 units 201-250=4 units 251-300=6 units 301-350=8 units 351-450=10  units Above 450 call MD    [provider]  insulin glargine, 2 Unit Dial, (TOUJEO MAX SOLOSTAR) 300 UNIT/ML Solostar Pen Inject 30 Units into the skin daily. May need to slowly increase the dose depending upon your blood sugar, follow-up with PCP 07/22/22   Lanae Boast, MD  Insulin Pen Needle 32G X 4 MM MISC  Use 4x a day 05/25/22   Carlus Pavlov, MD  metoprolol succinate (TOPROL-XL) 50 MG 24 hr tablet TAKE (1) TABLET BY MOUTH DAILY WITH FOOD *TAKE AFTER DIALYSIS* Patient taking differently: Take 50 mg by mouth See admin instructions. Take Tues, thurs, sat & sunday 07/29/21   [name], MD  mupirocin ointment (BACTROBAN) 2 % Apply 1 Application topically 2 (two) times daily. 11/12/22   Anabel Halon, MD  OneTouch Delica Lancets 33G MISC Use to check blood sugar 4X daily. 04/16/22   Carlus Pavlov, MD  oxyCODONE-acetaminophen (PERCOCET/ROXICET) 5-325 MG tablet Take 1 tablet by mouth every 4 (four) hours as needed. 10/13/22   [name], MD  pregabalin (LYRICA) 50 MG capsule TAKE ONE CAPSULE BY MOUTH three times a day 08/24/22   Vickki Hearing, MD  sertraline (ZOLOFT) 50 MG tablet TAKE ONE TABLET BY MOUTH EVERY DAY Patient taking differently: Take 100 mg by mouth daily. 09/22/22   [name], MD  torsemide (DEMADEX) 100 MG tablet TAKE 1 TABLET BY MOUTH TWICE DAILY *HOLD MORNING DOSE ON DIALYSIS DAYS* Patient not taking: Reported on 11/02/2022 09/22/22   [name], MD  [name] 500 MG chewable tablet Chew 500-1,000 mg by mouth See admin instructions. Take 1000mg  (2 tablets) by mouth with meals and 500mg  (1 tablet) with snacks 04/15/22   [provider]  FLUoxetine (PROZAC) 10 MG capsule Take 10 mg by mouth daily.    05/28/11  [provider]  glipiZIDE (GLUCOTROL) 10 MG tablet Take 10 mg by mouth 2 (two) times daily before a meal.    05/28/11  [provider]    Physical Exam: Vitals:   11/19/22 0300 11/19/22 0400 11/19/22 0500 11/19/22  0510  BP: (!) 162/76 (!) 152/61 (!) 147/64   Pulse: 69 70 67   Resp: 19 15 14    Temp:    (!) 97.5 F (36.4 C)  TempSrc:    Oral  SpO2: 94% 93% 97%   Weight:       1.  General: Patient lying supine in bed,  no acute distress   2. Psychiatric: Alert and oriented x 3, mood and behavior normal for situation, pleasant and cooperative with exam   3. Neurologic: Speech and language are normal, face is symmetric, moves all 4 extremities voluntarily, at baseline without acute deficits on limited exam   4. HEENMT:  Head is atraumatic, normocephalic, pupils reactive to light, neck is supple, trachea is midline, mucous membranes are moist   5. Respiratory : Lungs are clear to auscultation bilaterally without wheezing, rhonchi, rales, no cyanosis, no increase in work of breathing or accessory muscle use   6. Cardiovascular : Heart rate normal, rhythm is regular, no murmurs, rubs or gallops, no peripheral edema, peripheral pulses palpated   7. Gastrointestinal:  Abdomen is soft, nondistended, nontender to palpation bowel sounds active, no masses or organomegaly palpated   8. Skin:  Skin is warm, dry and intact without rashes, acute lesions, or ulcers on limited exam   9.Musculoskeletal:  Bilateral BKA  Data Reviewed: All labs imaging and workup in the ED reviewed by me  Assessment and Plan: * Generalized weakness - PT eval and treat - No infectious symptoms  ESRD on hemodialysis [facility]) - Monday Wednesday Friday dialysis - Consult nephrology - Continue [name] - Continue to monitor  HTN (hypertension) - Continue Norvasc  Type 2 diabetes mellitus with hyperglycemia (HCC) - Continue reduced dose of basal insulin - Continue sliding scale coverage -  Continue to monitor  PAD (peripheral artery disease) (HCC) - Continue aspirin, cholesterol control  Hyperlipidemia - Continue Zetia      Advance Care Planning:   Code Status: Full Code  Consults: Nephrology  Family  Communication: No family at bedside  Severity of Illness: The appropriate patient status for this patient is OBSERVATION. Observation status is judged to be reasonable and necessary in order to provide the required intensity of service to ensure the patient's safety. The patient's presenting symptoms, physical exam findings, and initial radiographic and laboratory data in the context of their medical condition is felt to place them at decreased risk for further clinical deterioration. Furthermore, it is anticipated that the patient will be medically stable for discharge from the hospital within 2 midnights of admission.   Author: Lilyan Gilford, DO 11/19/2022 6:01 AM  For on call review [email or web address].

## 2022-11-19 NOTE — Progress Notes (Signed)
Pt arrived to room 338 via stretcher from ED. Pt moved onto bed by staff. Pt with bilateral BKA. Pt A&O x4, denies c/o at present. VSS. No resp difficulty noted.  Daughter at bedside and she requested to provide pt with bath. Bath supplies provided. Call bell within reach. Advised to call for needs.

## 2022-11-19 NOTE — Assessment & Plan Note (Signed)
-   Continue aspirin, cholesterol control

## 2022-11-19 NOTE — TOC Initial Note (Signed)
Transition of Care [facility]) - Initial/Assessment Note    Patient Details  Name: Kathryn Beck MRN: [id number] Date of Birth: Feb 17, 1956  Transition of [facility]) CM/SW Contact:    Annice Needy, LCSW Phone Number: 11/19/2022, 2:14 PM  Clinical Narrative:                 Patient from home with spouse. Recently d/c from [facility] rehab. Admitted for generalized weakness. Active with HHPT/aide. Has hoyer lift, wheelchair, adjustable bed, bsc, and sliding board.  TOC will follow and address needs as they arise.   Expected Discharge Plan: Home w Home Health Services Barriers to Discharge: Continued Medical Work up   Patient Goals and CMS Choice            Expected Discharge Plan and Services                                              Prior Living Arrangements/Services   Lives with:: Spouse Patient language and need for interpreter reviewed:: Yes        Need for Family Participation in Patient Care: Yes (Comment) Care giver support system in place?: Yes (comment) Current home services: DME Criminal Activity/Legal Involvement Pertinent to Current Situation/Hospitalization: No - Comment as needed  Activities of Daily Living Home Assistive Devices/Equipment: Eyeglasses, Bedside commode/3-in-1, Raised toilet seat with rails, Wheelchair ADL Screening (condition at time of admission) Patient's cognitive ability adequate to safely complete daily activities?: Yes Is the patient deaf or have difficulty hearing?: No Does the patient have difficulty seeing, even when wearing glasses/contacts?: No Does the patient have difficulty concentrating, remembering, or making decisions?: No Patient able to express need for assistance with ADLs?: Yes Does the patient have difficulty dressing or bathing?: No Independently performs ADLs?: No Communication: Independent Dressing (OT): Needs assistance Is this a change from baseline?: Pre-admission baseline Grooming: Needs  assistance Is this a change from baseline?: Pre-admission baseline Feeding: Independent Bathing: Needs assistance Is this a change from baseline?: Pre-admission baseline Toileting: Needs assistance Is this a change from baseline?: Pre-admission baseline In/Out Bed: Dependent Is this a change from baseline?: Pre-admission baseline Walks in Home: Dependent Does the patient have difficulty walking or climbing stairs?: Yes Weakness of Legs: Both Weakness of Arms/Hands: Both  Permission Sought/Granted                  Emotional Assessment       Orientation: : Oriented to Self, Oriented to Place, Oriented to  Time, Oriented to Situation Alcohol / Substance Use: Not Applicable Psych Involvement: No (comment)  Admission diagnosis:  End stage renal disease on dialysis (HCC) [N18.6, Z99.2] Generalized weakness [R53.1] Multifocal pneumonia [J18.9] Patient Active Problem List   Diagnosis Date Noted   Generalized weakness 11/18/2022   Paronychia of finger of right hand 11/13/2022   Dehiscence of amputation stump of left lower extremity (HCC) 10/07/2022   Status post below-knee amputation of left lower extremity (HCC) 10/07/2022   Subacute osteomyelitis, left ankle and foot (HCC) 09/03/2022   Left foot infection 09/03/2022   Amputated left leg (HCC) 09/03/2022   Wound infection after surgery 09/03/2022   Equinus contracture of left ankle 08/15/2022   Acute osteomyelitis of toe of left foot (HCC) 08/13/2022   Peripheral vascular complication 07/21/2022   PVD (peripheral vascular disease) (HCC) 07/18/2022   Toe ulcer, left, limited to breakdown  of skin (HCC) 07/13/2022   S/P BKA (below knee amputation), right (HCC) 05/29/2022   Confusion 05/19/2022   Visual hallucinations 05/19/2022   Watery eyes 05/19/2022   Requires assistance with activities of daily living (ADL) 04/26/2022   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   Gangrene of right foot (HCC) 04/25/2022   PAD  (peripheral artery disease) (HCC) 04/25/2022   Other osteoporosis without current pathological fracture 11/10/2021   Hypocalcemia 11/10/2021   Fall 11/02/2021   Left foot pain 10/31/2021   Low back pain with left-sided sciatica 10/31/2021   Left ear impacted cerumen 09/18/2021   Chronic left shoulder pain 06/10/2021   Abnormal CXR 03/10/2021   Ear pain, bilateral 03/10/2021   Morbid obesity (HCC) 03/10/2021   Subungual hematoma of second toe of left foot 10/28/2020   Insomnia 10/28/2020   Memory loss or impairment 09/26/2020   Forgetfulness 09/26/2020   Recurrent vertigo 09/26/2020   Abnormal MRI, cervical spine 03/04/2020   Right leg weakness 02/28/2020   Knee pain, right 10/23/2019   Cough 08/09/2019   Carpal tunnel syndrome 06/13/2019   Chronic pain syndrome 06/13/2019   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) 06/13/2019   Neck pain 03/23/2019   Near syncope 12/05/2018   ESRD on hemodialysis (HCC) 11/22/2018   Ischemic heart disease 09/25/2018   Not currently working due to disabled status 06/13/2018   HTN (hypertension) 06/12/2018   Depression 03/09/2018   Adrenal mass, left (HCC) 11/09/2017   MGUS (monoclonal gammopathy of unknown significance) 11/05/2017   Shoulder pain 11/01/2017   Chronic diastolic (congestive) heart failure (HCC) 09/20/2017   Bilateral leg weakness 09/09/2017   Adrenal adenoma 09/03/2017   Uncontrolled type 2 diabetes mellitus 08/18/2017   Unsteady gait 07/11/2017   Recurrent falls 07/11/2017   Anemia of chronic disease 03/24/2017   Personal history of colonic polyps 02/10/2017   Dysphagia 11/05/2016   Irritable bowel syndrome 02/04/2016   Hospital discharge follow-up 10/27/2015   Intolerant of heat 10/07/2014   Cardiac murmur 01/17/2014   Back pain with left-sided radiculopathy 09/18/2013   Seasonal allergies 03/10/2013   Lipoma of back 12/22/2012   Other seasonal allergic rhinitis 08/22/2012   OSA (obstructive sleep  apnea) 06/02/2011   Chronic kidney disease, stage 4 (severe) (HCC) 01/13/2011   Neuropathy 04/16/2010   Malaise and fatigue 07/24/2009   Personal history of other diseases of the nervous system and sense organs 05/08/2009   LUPUS ERYTHEMATOSUS, DISCOID 06/05/2008   Arm pain 05/30/2008   Hyperlipidemia 06/07/2007   Obesity 06/07/2007   Malignant hypertension 06/07/2007   Type 2 diabetes mellitus with hyperglycemia (HCC) 06/07/2007   Diabetes mellitus (HCC) 06/07/2007   PCP:  [name], MD Pharmacy:   [facility] - [place], [place] - [address] [place] [address] Phone: [phone] Fax: [phone]     Social Determinants of Health (SDOH) Social History: SDOH Screenings   Food Insecurity: No Food Insecurity (11/19/2022)  Housing: Low Risk  (11/19/2022)  Transportation Needs: No Transportation Needs (11/19/2022)  Utilities: Not At Risk (11/19/2022)  Alcohol Screen: Low Risk  (05/12/2022)  Depression (PHQ2-9): Medium Risk (11/12/2022)  Financial Resource Strain: Medium Risk (05/12/2022)  Physical Activity: Inactive (05/12/2022)  Social Connections: Socially Integrated (05/12/2022)  Stress: No Stress Concern Present (05/12/2022)  Tobacco Use: Low Risk  (11/18/2022)   SDOH Interventions:     Readmission Risk Interventions    05/01/2022    3:54 PM  Readmission Risk Prevention  Plan  Transportation Screening Complete  HRI or Home Care Consult Complete  Social Work Consult for Recovery Care Planning/Counseling --  Palliative Care Screening Not Applicable  Medication Review Oceanographer) Complete

## 2022-11-20 ENCOUNTER — Ambulatory Visit: Payer: Medicare PPO | Admitting: Neurology

## 2022-11-20 ENCOUNTER — Other Ambulatory Visit: Payer: Self-pay

## 2022-11-20 ENCOUNTER — Emergency Department (HOSPITAL_COMMUNITY)
Admission: EM | Admit: 2022-11-20 | Discharge: 2022-11-20 | Disposition: A | Discharge status: Home / Self Care | Payer: Medicare PPO | Source: Home / Self Care | Attending: Emergency Medicine | Admitting: Emergency Medicine

## 2022-11-20 ENCOUNTER — Encounter (HOSPITAL_COMMUNITY): Payer: Self-pay

## 2022-11-20 DIAGNOSIS — E11649 Type 2 diabetes mellitus with hypoglycemia without coma: Secondary | ICD-10-CM | POA: Insufficient documentation

## 2022-11-20 DIAGNOSIS — Z992 Dependence on renal dialysis: Secondary | ICD-10-CM | POA: Diagnosis not present

## 2022-11-20 DIAGNOSIS — I10 Essential (primary) hypertension: Secondary | ICD-10-CM | POA: Diagnosis not present

## 2022-11-20 DIAGNOSIS — I120 Hypertensive chronic kidney disease with stage 5 chronic kidney disease or end stage renal disease: Secondary | ICD-10-CM | POA: Diagnosis not present

## 2022-11-20 DIAGNOSIS — E10649 Type 1 diabetes mellitus with hypoglycemia without coma: Secondary | ICD-10-CM | POA: Diagnosis not present

## 2022-11-20 DIAGNOSIS — E162 Hypoglycemia, unspecified: Secondary | ICD-10-CM | POA: Diagnosis not present

## 2022-11-20 DIAGNOSIS — N186 End stage renal disease: Secondary | ICD-10-CM

## 2022-11-20 DIAGNOSIS — Z794 Long term (current) use of insulin: Secondary | ICD-10-CM | POA: Insufficient documentation

## 2022-11-20 DIAGNOSIS — Z7984 Long term (current) use of oral hypoglycemic drugs: Secondary | ICD-10-CM | POA: Diagnosis not present

## 2022-11-20 DIAGNOSIS — E1122 Type 2 diabetes mellitus with diabetic chronic kidney disease: Secondary | ICD-10-CM | POA: Insufficient documentation

## 2022-11-20 DIAGNOSIS — Z7982 Long term (current) use of aspirin: Secondary | ICD-10-CM | POA: Insufficient documentation

## 2022-11-20 DIAGNOSIS — Z8639 Personal history of other endocrine, nutritional and metabolic disease: Secondary | ICD-10-CM

## 2022-11-20 DIAGNOSIS — N2581 Secondary hyperparathyroidism of renal origin: Secondary | ICD-10-CM | POA: Diagnosis not present

## 2022-11-20 LAB — CBG MONITORING, ED
Glucose-Capillary: 134 mg/dL — ABNORMAL HIGH (ref 70–99)
Glucose-Capillary: 140 mg/dL — ABNORMAL HIGH (ref 70–99)
Glucose-Capillary: 140 mg/dL — ABNORMAL HIGH (ref 70–99)

## 2022-11-20 MED ORDER — TOUJEO MAX SOLOSTAR 300 UNIT/ML ~~LOC~~ SOPN: 27.0000 [IU] | PEN_INJECTOR | Freq: Every day | SUBCUTANEOUS | Status: DC

## 2022-11-20 NOTE — Discharge Instructions (Signed)
You were seen in the emergency department for your low blood sugar.  Please decrease your daily long-acting insulin by 3 units/day.  Follow-up with your primary doctor in 2 to 3 days regarding her symptoms.  Return to the emergency department if you experience any concerning symptoms such as confusion, weakness, numbness, dizziness, or any other concerns.

## 2022-11-20 NOTE — ED Provider Notes (Signed)
[place] EMERGENCY DEPARTMENT AT [facility] Provider Note   CSN: [id number] Arrival date & time: 11/20/22  1219     History  Chief Complaint  Patient presents with   Hypoglycemia    Kathryn Beck is a 67 y.o. female.  67 year old female with a history of insulin-dependent diabetes and ESRD who presents to the emergency department with hypoglycemia.  Patient was at dialysis today and after receiving a session was noted to have altered mental status.  Blood sugar was checked it was 52.  Was given oral glucose and her blood sugar improved to the 70s.  On arrival she is alert and oriented.  Reported that she was doing well prior to this.  Does take 30 units of long-acting insulin daily as well as a sliding scale insulin with meals.  Family which included her son and husband who are at the bedside.  Reports that she is back to her baseline now.  Had no concerns about her being at home at this time.       Home Medications Prior to Admission medications   Medication Sig Start Date End Date Taking? Authorizing Provider  amLODipine (NORVASC) 5 MG tablet Take 1 tablet (5 mg total) by mouth daily. 11/12/22   Anabel Halon, MD  aspirin 81 MG chewable tablet CHEW TWO TABLETS BY MOUTH EVERY DAY Patient taking differently: Chew 162 mg by mouth daily. 07/24/22   [name], MD  B Complex-C-Folic Acid (RENA-VITE RX) 1 MG TABS Take 1 tablet by mouth daily. 04/15/22   [provider]  Blood Glucose Monitoring Suppl (ONETOUCH VERIO) w/Device KIT Use to check blood sugar 4X daily. 04/16/22   Carlus Pavlov, MD  calcitRIOL (ROCALTROL) 0.25 MCG capsule TAKE 1 CAPSULE (0.25 MCG TOTAL) BY MOUTH 2 (TWO) TIMES DAILY WITH A MEAL. 06/05/22   Roma Kayser, MD  cinacalcet (SENSIPAR) 30 MG tablet Take 3 tablets (90 mg total) by mouth 3 (three) times a week. 09/09/22   Marguerita Merles Latif, DO  Continuous Blood Gluc Receiver (FREESTYLE LIBRE 2 READER) DEVI 1 each by Does not  apply route daily. 04/16/22   Carlus Pavlov, MD  Continuous Blood Gluc Sensor (FREESTYLE LIBRE 2 SENSOR) MISC 1 each by Does not apply route every 14 (fourteen) days. 04/16/22   Carlus Pavlov, MD  dextromethorphan-guaiFENesin [facility] DM) 30-600 MG 12hr tablet Take 1 tablet by mouth 2 (two) times daily as needed for cough. 11/19/22   Vassie Loll, MD  DULoxetine (CYMBALTA) 60 MG capsule TAKE (1) CAPSULE BY MOUTH ONCE DAILY. Patient taking differently: Take 60 mg by mouth daily. 09/17/20   [name], MD  ezetimibe (ZETIA) 10 MG tablet Take 1 tablet (10 mg total) by mouth daily. 05/25/22   [name], MD  FIASP FLEXTOUCH 100 UNIT/ML FlexTouch Pen INJECT 18 TO 30 UNITS INTO THE SKIN WITH BREAKFAST, WITH LUNCH, AND WITH EVENING MEAL Patient taking differently: Inject 18-30 Units into the skin 3 (three) times daily. Sliding scale 10/20/22   Carlus Pavlov, MD  glucose blood (ONETOUCH VERIO) test strip Use as instructed to check blood sugar 4X daily. 04/16/22   Carlus Pavlov, MD  hydrOXYzine (ATARAX) 25 MG tablet Take 25 mg by mouth every 12 (twelve) hours as needed for itching.    [provider]  insulin aspart (NOVOLOG) 100 UNIT/ML injection Inject 1-10 Units into the skin 3 (three) times daily before meals. Sliding scale 101-150=1 units 151-200=2 units 201-250=4 units 251-300=6 units 301-350=8 units 351-450=10  units Above 450 call MD    [provider]  insulin glargine, 2 Unit Dial, (TOUJEO MAX SOLOSTAR) 300 UNIT/ML Solostar Pen Inject 27 Units into the skin daily. May need to slowly increase the dose depending upon your blood sugar, follow-up with PCP 11/20/22   Rondel Baton, MD  Insulin Pen Needle 32G X 4 MM MISC Use 4x a day 05/25/22   Carlus Pavlov, MD  metoprolol succinate (TOPROL-XL) 50 MG 24 hr tablet TAKE (1) TABLET BY MOUTH DAILY WITH FOOD *TAKE AFTER DIALYSIS* Patient taking differently: Take 50 mg by mouth See admin instructions.  Take Tues, thurs, sat & sunday 07/29/21   [name], MD  OneTouch Delica Lancets 33G MISC Use to check blood sugar 4X daily. 04/16/22   Carlus Pavlov, MD  oxyCODONE-acetaminophen (PERCOCET/ROXICET) 5-325 MG tablet Take 1 tablet by mouth every 4 (four) hours as needed. 10/13/22   [name], MD  pregabalin (LYRICA) 50 MG capsule Take 1 capsule (50 mg total) by mouth daily. 11/19/22   Vassie Loll, MD  sertraline (ZOLOFT) 50 MG tablet Take 2 tablets (100 mg total) by mouth daily. 11/19/22   Vassie Loll, MD  [name] 500 MG chewable tablet Chew 500-1,000 mg by mouth See admin instructions. Take 1000mg  (2 tablets) by mouth with meals and 500mg  (1 tablet) with snacks 04/15/22   [provider]  FLUoxetine (PROZAC) 10 MG capsule Take 10 mg by mouth daily.    05/28/11  [provider]  glipiZIDE (GLUCOTROL) 10 MG tablet Take 10 mg by mouth 2 (two) times daily before a meal.    05/28/11  [provider]      Allergies    Ace inhibitors, Penicillins, Statins, and Albuterol    Review of Systems   Review of Systems  Physical Exam Updated Vital Signs BP (!) 169/79 (BP Location: Right Arm)   Pulse 72   Temp 97.9 F (36.6 C) (Oral)   Resp 18   Ht 5' (1.524 m)   Wt 81.7 kg   SpO2 97%   BMI 35.18 kg/m  Physical Exam Vitals and nursing note reviewed.  Constitutional:      General: She is not in acute distress.    Appearance: She is well-developed.  HENT:     Head: Normocephalic and atraumatic.     Right Ear: External ear normal.     Left Ear: External ear normal.     Nose: Nose normal.  Eyes:     Extraocular Movements: Extraocular movements intact.     Conjunctiva/sclera: Conjunctivae normal.     Pupils: Pupils are equal, round, and reactive to light.  Cardiovascular:     Rate and Rhythm: Normal rate and regular rhythm.     Heart sounds: No murmur heard. Pulmonary:     Effort: Pulmonary effort is normal. No respiratory distress.     Breath sounds:  Normal breath sounds.  Musculoskeletal:     Cervical back: Normal range of motion and neck supple.     Comments: Fistula in left upper extremity with bruit and thrill.  Bilateral BKA's.  Skin:    General: Skin is warm and dry.  Neurological:     Mental Status: She is alert and oriented to person, place, and time. Mental status is at baseline.     Cranial Nerves: No cranial nerve deficit.     Sensory: No sensory deficit.     Motor: No weakness.  Psychiatric:        Mood and  Affect: Mood normal.     ED Results / Procedures / Treatments   Labs (all labs ordered are listed, but only abnormal results are displayed) Labs Reviewed  CBG MONITORING, ED - Abnormal; Notable for the following components:      Result Value   Glucose-Capillary 134 (*)    All other components within normal limits  CBG MONITORING, ED - Abnormal; Notable for the following components:   Glucose-Capillary 140 (*)    All other components within normal limits  CBG MONITORING, ED - Abnormal; Notable for the following components:   Glucose-Capillary 140 (*)    All other components within normal limits    EKG None  Radiology CT Chest Wo Contrast  Result Date: 11/18/2022 CLINICAL DATA:  Pneumonia, complication suspected, xray done, short of breath after dialysis EXAM: CT CHEST WITHOUT CONTRAST TECHNIQUE: Multidetector CT imaging of the chest was performed following the standard protocol without IV contrast. RADIATION DOSE REDUCTION: This exam was performed according to the departmental dose-optimization program which includes automated exposure control, adjustment of the mA and/or kV according to patient size and/or use of iterative reconstruction technique. COMPARISON:  11/18/2022, 09/03/2018 FINDINGS: Cardiovascular: Unenhanced imaging of the heart demonstrates cardiomegaly without pericardial effusion. Calcification of the aortic and mitral valves. Normal caliber of the thoracic aorta. Extensive atherosclerosis of  the aorta and coronary vasculature. Evaluation of the vascular lumen is limited without IV contrast. Mediastinum/Nodes: No enlarged mediastinal or axillary lymph nodes. Thyroid gland, trachea, and esophagus demonstrate no significant findings. Lungs/Pleura: There are patchy bilateral areas of ground-glass airspace disease, most pronounced within the right upper lobe. Findings could reflect asymmetric edema or multifocal infection, including atypical infection such as viral etiology. No effusion or pneumothorax. Central airways are patent. Upper Abdomen: Stable bilateral low-attenuation adrenal thickening, consistent with hyperplasia or adenomas. No acute upper abdominal findings. Musculoskeletal: No acute or destructive bony abnormalities. Reconstructed images demonstrate no additional findings. IMPRESSION: 1. Patchy bilateral ground-glass airspace disease, which may reflect asymmetric edema or multifocal infection including atypical organisms such as viral etiology. 2. Cardiomegaly without pericardial effusion. 3. Aortic Atherosclerosis (ICD10-I70.0). Coronary artery atherosclerosis. Electronically Signed   By: Sharlet Salina M.D.   On: 11/18/2022 20:38    Procedures Procedures    Medications Ordered in ED Medications - No data to display  ED Course/ Medical Decision Making/ A&P                                 Medical Decision Making Risk Prescription drug management.   Kathryn Beck is a 67 y.o. female with comorbidities that complicate the patient evaluation including bilateral BKA's, ESRD on IHD, and insulin-dependent diabetes who presents to the emergency department with altered mental status and hypoglycemia  Initial Ddx:  Hypoglycemia, insulin overdose, delirium, stroke  MDM/Course:  Patient presented to the emergency department with altered mental status in the setting of a low blood sugar.  Does have ESRD but is still using increased risk for hypoglycemic episodes.  Appears to be  back to her baseline.  We did recheck of blood sugar on her that was normal.  She was able to tolerate p.o. in the emergency department.  Appears that she may not have been eating as much today and so suspect that that may have caused her blood sugars to get low.  Upon re-evaluation was back to her baseline.  No focal neurologic deficits to suggest a stroke.  Was not  had any recent illnesses to suggest delirium.  Family feels comfortable taking her home and says she is back to baseline at this time.  Will have her decrease her long-acting insulin to 27 units daily to try and avoid any more hypoglycemic episodes.  Instructed to follow-up with her primary doctor in 3 days regarding her blood sugars.  This patient presents to the ED for concern of complaints listed in HPI, this involves an extensive number of treatment options, and is a complaint that carries with it a high risk of complications and morbidity. Disposition including potential need for admission considered.   Dispo: DC Home. Return precautions discussed including, but not limited to, those listed in the AVS. Allowed pt time to ask questions which were answered fully prior to dc.  Additional history obtained from family Records reviewed Outpatient Clinic Notes I have reviewed the patients home medications and made adjustments as needed Social Determinants of health:  Elderly         Final Clinical Impression(s) / ED Diagnoses Final diagnoses:  Hypoglycemia  H/O insulin dependent diabetes mellitus  ESRD (end stage renal disease) (HCC)    Rx / DC Orders ED Discharge Orders          Ordered    insulin glargine, 2 Unit Dial, (TOUJEO MAX SOLOSTAR) 300 UNIT/ML Solostar Pen  Daily        11/20/22 1358              Rondel Baton, MD 11/20/22 1744

## 2022-11-20 NOTE — ED Triage Notes (Signed)
Pt brought in by [facility] for hypoglycemia episode during dialysis. Pt CBG at dialysis was 52 and after sugar packets, and oral glucose EMS CBG 72. Per EMS pt has been admitted into the hospital recently for bilateral BKA and feel she needs to go back to rehab. EDP notified. Pt did not eat or take her medications before dialysis today.

## 2022-11-21 DIAGNOSIS — M62521 Muscle wasting and atrophy, not elsewhere classified, right upper arm: Secondary | ICD-10-CM | POA: Diagnosis not present

## 2022-11-21 DIAGNOSIS — D631 Anemia in chronic kidney disease: Secondary | ICD-10-CM | POA: Diagnosis not present

## 2022-11-21 DIAGNOSIS — N186 End stage renal disease: Secondary | ICD-10-CM | POA: Diagnosis not present

## 2022-11-21 DIAGNOSIS — I132 Hypertensive heart and chronic kidney disease with heart failure and with stage 5 chronic kidney disease, or end stage renal disease: Secondary | ICD-10-CM | POA: Diagnosis not present

## 2022-11-21 DIAGNOSIS — I5032 Chronic diastolic (congestive) heart failure: Secondary | ICD-10-CM | POA: Diagnosis not present

## 2022-11-21 DIAGNOSIS — E1122 Type 2 diabetes mellitus with diabetic chronic kidney disease: Secondary | ICD-10-CM | POA: Diagnosis not present

## 2022-11-21 DIAGNOSIS — Z4781 Encounter for orthopedic aftercare following surgical amputation: Secondary | ICD-10-CM | POA: Diagnosis not present

## 2022-11-21 DIAGNOSIS — M62522 Muscle wasting and atrophy, not elsewhere classified, left upper arm: Secondary | ICD-10-CM | POA: Diagnosis not present

## 2022-11-21 DIAGNOSIS — Z992 Dependence on renal dialysis: Secondary | ICD-10-CM | POA: Diagnosis not present

## 2022-11-21 DIAGNOSIS — E1151 Type 2 diabetes mellitus with diabetic peripheral angiopathy without gangrene: Secondary | ICD-10-CM | POA: Diagnosis not present

## 2022-11-23 DIAGNOSIS — N2581 Secondary hyperparathyroidism of renal origin: Secondary | ICD-10-CM | POA: Diagnosis not present

## 2022-11-23 DIAGNOSIS — N186 End stage renal disease: Secondary | ICD-10-CM | POA: Diagnosis not present

## 2022-11-23 DIAGNOSIS — Z992 Dependence on renal dialysis: Secondary | ICD-10-CM | POA: Diagnosis not present

## 2022-11-24 ENCOUNTER — Ambulatory Visit: Payer: Medicare PPO | Admitting: Neurology

## 2022-11-24 ENCOUNTER — Telehealth: Payer: Self-pay | Admitting: Family Medicine

## 2022-11-24 DIAGNOSIS — Z4781 Encounter for orthopedic aftercare following surgical amputation: Secondary | ICD-10-CM | POA: Diagnosis not present

## 2022-11-24 DIAGNOSIS — M62521 Muscle wasting and atrophy, not elsewhere classified, right upper arm: Secondary | ICD-10-CM | POA: Diagnosis not present

## 2022-11-24 DIAGNOSIS — I5032 Chronic diastolic (congestive) heart failure: Secondary | ICD-10-CM | POA: Diagnosis not present

## 2022-11-24 DIAGNOSIS — D631 Anemia in chronic kidney disease: Secondary | ICD-10-CM | POA: Diagnosis not present

## 2022-11-24 DIAGNOSIS — N186 End stage renal disease: Secondary | ICD-10-CM | POA: Diagnosis not present

## 2022-11-24 DIAGNOSIS — I132 Hypertensive heart and chronic kidney disease with heart failure and with stage 5 chronic kidney disease, or end stage renal disease: Secondary | ICD-10-CM | POA: Diagnosis not present

## 2022-11-24 DIAGNOSIS — M62522 Muscle wasting and atrophy, not elsewhere classified, left upper arm: Secondary | ICD-10-CM | POA: Diagnosis not present

## 2022-11-24 DIAGNOSIS — E1122 Type 2 diabetes mellitus with diabetic chronic kidney disease: Secondary | ICD-10-CM | POA: Diagnosis not present

## 2022-11-24 DIAGNOSIS — E1151 Type 2 diabetes mellitus with diabetic peripheral angiopathy without gangrene: Secondary | ICD-10-CM | POA: Diagnosis not present

## 2022-11-24 NOTE — Telephone Encounter (Signed)
Verbal order given  

## 2022-11-24 NOTE — Telephone Encounter (Signed)
Kathryn Beck, Occupation therapist with [name] called in on patient behalf  Verbal orders to add OT visit.  Patient missed a visit  due to ER   Call back [phone]

## 2022-11-25 DIAGNOSIS — N186 End stage renal disease: Secondary | ICD-10-CM | POA: Diagnosis not present

## 2022-11-25 DIAGNOSIS — Z992 Dependence on renal dialysis: Secondary | ICD-10-CM | POA: Diagnosis not present

## 2022-11-25 DIAGNOSIS — N2581 Secondary hyperparathyroidism of renal origin: Secondary | ICD-10-CM | POA: Diagnosis not present

## 2022-11-26 ENCOUNTER — Encounter: Payer: Self-pay | Admitting: Orthopedic Surgery

## 2022-11-26 ENCOUNTER — Ambulatory Visit (INDEPENDENT_AMBULATORY_CARE_PROVIDER_SITE_OTHER): Payer: Medicare PPO | Admitting: Neurology

## 2022-11-26 ENCOUNTER — Ambulatory Visit (INDEPENDENT_AMBULATORY_CARE_PROVIDER_SITE_OTHER): Payer: Medicare PPO | Admitting: Orthopedic Surgery

## 2022-11-26 ENCOUNTER — Encounter: Payer: Self-pay | Admitting: Neurology

## 2022-11-26 VITALS — BP 136/60 | Wt 186.0 lb

## 2022-11-26 DIAGNOSIS — I5032 Chronic diastolic (congestive) heart failure: Secondary | ICD-10-CM | POA: Diagnosis not present

## 2022-11-26 DIAGNOSIS — R251 Tremor, unspecified: Secondary | ICD-10-CM | POA: Diagnosis not present

## 2022-11-26 DIAGNOSIS — Z89511 Acquired absence of right leg below knee: Secondary | ICD-10-CM

## 2022-11-26 DIAGNOSIS — N186 End stage renal disease: Secondary | ICD-10-CM | POA: Diagnosis not present

## 2022-11-26 DIAGNOSIS — M62521 Muscle wasting and atrophy, not elsewhere classified, right upper arm: Secondary | ICD-10-CM | POA: Diagnosis not present

## 2022-11-26 DIAGNOSIS — E1122 Type 2 diabetes mellitus with diabetic chronic kidney disease: Secondary | ICD-10-CM | POA: Diagnosis not present

## 2022-11-26 DIAGNOSIS — E1151 Type 2 diabetes mellitus with diabetic peripheral angiopathy without gangrene: Secondary | ICD-10-CM | POA: Diagnosis not present

## 2022-11-26 DIAGNOSIS — Z89512 Acquired absence of left leg below knee: Secondary | ICD-10-CM

## 2022-11-26 DIAGNOSIS — G3184 Mild cognitive impairment, so stated: Secondary | ICD-10-CM

## 2022-11-26 DIAGNOSIS — I132 Hypertensive heart and chronic kidney disease with heart failure and with stage 5 chronic kidney disease, or end stage renal disease: Secondary | ICD-10-CM | POA: Diagnosis not present

## 2022-11-26 DIAGNOSIS — D631 Anemia in chronic kidney disease: Secondary | ICD-10-CM | POA: Diagnosis not present

## 2022-11-26 DIAGNOSIS — Z4781 Encounter for orthopedic aftercare following surgical amputation: Secondary | ICD-10-CM | POA: Diagnosis not present

## 2022-11-26 DIAGNOSIS — M62522 Muscle wasting and atrophy, not elsewhere classified, left upper arm: Secondary | ICD-10-CM | POA: Diagnosis not present
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
## 2022-11-26 NOTE — Patient Instructions (Signed)
Continue current medications Decrease Zoloft to 25 mg for 2 weeks then discontinue the medication Please contact me if patient has worsening depression or anxiety.   Continue with Lyrica 50 mg daily Follow-up in 6 months or sooner if worse.

## 2022-11-26 NOTE — Progress Notes (Signed)
Office Visit Note   Patient: Kathryn Beck           Date of Birth: [birth year]           MRN: [id number] Visit Date: 11/26/2022              Requested by: [name], MD [address] [place],  [place] [address] PCP: [name], MD  Chief Complaint  Patient presents with   Left Leg - Routine Post Op    10/07/2022 left BKA       HPI: Patient is a 67 year old woman who is 59-month status post left below-knee amputation and is a right below-knee amputee.  Patient still has a 4 XL compression sock.  Assessment & Plan: Visit Diagnoses:  1. Right below-knee amputee (HCC)   2. Left below-knee amputee [facility])     Plan: Prescription was provided for [employer] for smaller stump shrinkers patient will also need bilateral prosthesis.  Follow-Up Instructions: Return in about 4 weeks (around 12/24/2022).   Ortho Exam  Patient is alert, oriented, no adenopathy, well-dressed, normal affect, normal respiratory effort. Examination patient has a 2 open wounds on the left residual limb that are about 1 x 2 cm.  There is healthy granulation tissue the residual limb is consolidated well.  Patient is a new left transtibial  amputee.  Patient's current comorbidities are not expected to impact the ability to function with the prescribed prosthesis. Patient verbally communicates a strong desire to use a prosthesis. Patient currently requires mobility aids to ambulate without a prosthesis.  Expects not to use mobility aids with a new prosthesis.  Patient is a K2 level ambulator that will use a prosthesis to walk around their home and the community over low level environmental barriers.   Patient is an existing right transtibial  amputee.  Patient's current comorbidities are not expected to impact the ability to function with the prescribed prosthesis. Patient verbally communicates a strong desire to use a prosthesis. Patient currently requires mobility aids to ambulate  without a prosthesis.  Expects not to use mobility aids with a new prosthesis.  Patient is a K2 level ambulator that will use a prosthesis to walk around their home and the community over low level environmental barriers.       Imaging: No results found.   Labs: Lab Results  Component Value Date   HGBA1C 6.3 (H) 07/07/2022   HGBA1C 9.3 (A) 04/16/2022   HGBA1C 8.9 (A) 10/28/2021   ESRSEDRATE 70 (H) 09/03/2022   ESRSEDRATE 79 (H) 08/13/2022   ESRSEDRATE 37 (H) 04/25/2022   CRP 0.9 09/03/2022   CRP 9.1 (H) 08/13/2022   CRP 29.4 (H) 04/25/2022   LABURIC 1.8 (L) 06/07/2018   LABURIC 6.2 04/22/2018   LABURIC 9.3 (H) 10/07/2015   REPTSTATUS 09/09/2022 FINAL 09/04/2022   GRAMSTAIN  09/04/2022    RARE WBC PRESENT, PREDOMINANTLY PMN NO ORGANISMS SEEN    CULT  09/04/2022    RARE ENTEROBACTER HORMAECHEI NO ANAEROBES ISOLATED Performed at [facility], [address], [place], [place] [address]    LABORGA ENTEROBACTER HORMAECHEI 09/04/2022     Lab Results  Component Value Date   ALBUMIN 3.4 (L) 11/19/2022   ALBUMIN 2.7 (L) 10/12/2022   ALBUMIN 3.0 (L) 10/09/2022   PREALBUMIN 26 08/13/2022   PREALBUMIN 24 04/25/2022    Lab Results  Component Value Date   MG 2.1 11/19/2022   MG 1.9 09/08/2022   MG 2.3 09/07/2022  Lab Results  Component Value Date   VD25OH 30.9 09/28/2017   VD25OH 15 (L) 10/07/2015   VD25OH 16 (L) 04/25/2015    Lab Results  Component Value Date   PREALBUMIN 26 08/13/2022   PREALBUMIN 24 04/25/2022      Latest Ref Rng & Units 11/19/2022    3:20 AM 11/18/2022    5:51 AM 10/14/2022    1:16 AM  CBC EXTENDED  WBC 4.0 - 10.5 K/uL 8.1  7.2  7.6   RBC 3.87 - 5.11 MIL/uL 4.09  3.94  3.68   Hemoglobin 12.0 - 15.0 g/dL 14.7  9.7  9.1   HCT 82.9 - 46.0 % 32.8  33.0  29.4   Platelets 150 - 400 K/uL 308  289  273   NEUT# 1.7 - 7.7 K/uL 6.2  5.6    Lymph# 0.7 - 4.0 K/uL 1.2  0.9       There is no height or weight on file to calculate  BMI.  Orders:  No orders of the defined types were placed in this encounter.  No orders of the defined types were placed in this encounter.    Procedures: No procedures performed  Clinical Data: No additional findings.  ROS:  All other systems negative, except as noted in the HPI. Review of Systems  Objective: Vital Signs: There were no vitals taken for this visit.  Specialty Comments:  No specialty comments available.  PMFS History: Patient Active Problem List   Diagnosis Date Noted   Generalized weakness 11/18/2022   Paronychia of finger of right hand 11/13/2022   Dehiscence of amputation stump of left lower extremity (HCC) 10/07/2022   Status post below-knee amputation of left lower extremity (HCC) 10/07/2022   Subacute osteomyelitis, left ankle and foot (HCC) 09/03/2022   Left foot infection 09/03/2022   Amputated left leg (HCC) 09/03/2022   Wound infection after surgery 09/03/2022   Equinus contracture of left ankle 08/15/2022   Acute osteomyelitis of toe of left foot (HCC) 08/13/2022   Peripheral vascular complication 07/21/2022   PVD (peripheral vascular disease) (HCC) 07/18/2022   Toe ulcer, left, limited to breakdown of skin (HCC) 07/13/2022   S/P BKA (below knee amputation), right (HCC) 05/29/2022   Confusion 05/19/2022   Visual hallucinations 05/19/2022   Watery eyes 05/19/2022   Requires assistance with activities of daily living (ADL) 04/26/2022   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   Gangrene of right foot (HCC) 04/25/2022   PAD (peripheral artery disease) (HCC) 04/25/2022   Other osteoporosis without current pathological fracture 11/10/2021   Hypocalcemia 11/10/2021   Fall 11/02/2021   Left foot pain 10/31/2021   Low back pain with left-sided sciatica 10/31/2021   Left ear impacted cerumen 09/18/2021   Chronic left shoulder pain 06/10/2021   Abnormal CXR 03/10/2021   Ear pain, bilateral 03/10/2021   Morbid obesity (HCC)  03/10/2021   Subungual hematoma of second toe of left foot 10/28/2020   Insomnia 10/28/2020   Memory loss or impairment 09/26/2020   Forgetfulness 09/26/2020   Recurrent vertigo 09/26/2020   Abnormal MRI, cervical spine 03/04/2020   Right leg weakness 02/28/2020   Knee pain, right 10/23/2019   Cough 08/09/2019   Carpal tunnel syndrome 06/13/2019   Chronic pain syndrome 06/13/2019   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) 06/13/2019   Neck pain 03/23/2019   Near syncope 12/05/2018   ESRD on hemodialysis (HCC) 11/22/2018   Ischemic heart disease 09/25/2018   Not  currently working due to disabled status 06/13/2018   HTN (hypertension) 06/12/2018   Depression 03/09/2018   Adrenal mass, left (HCC) 11/09/2017   MGUS (monoclonal gammopathy of unknown significance) 11/05/2017   Shoulder pain 11/01/2017   Chronic diastolic (congestive) heart failure (HCC) 09/20/2017   Bilateral leg weakness 09/09/2017   Adrenal adenoma 09/03/2017   Uncontrolled type 2 diabetes mellitus 08/18/2017   Unsteady gait 07/11/2017   Recurrent falls 07/11/2017   Anemia of chronic disease 03/24/2017   Personal history of colonic polyps 02/10/2017   Dysphagia 11/05/2016   Irritable bowel syndrome 02/04/2016   Hospital discharge follow-up 10/27/2015   Intolerant of heat 10/07/2014   Cardiac murmur 01/17/2014   Back pain with left-sided radiculopathy 09/18/2013   Seasonal allergies 03/10/2013   Lipoma of back 12/22/2012   Other seasonal allergic rhinitis 08/22/2012   OSA (obstructive sleep apnea) 06/02/2011   Chronic kidney disease, stage 4 (severe) (HCC) 01/13/2011   Neuropathy 04/16/2010   Malaise and fatigue 07/24/2009   Personal history of other diseases of the nervous system and sense organs 05/08/2009   LUPUS ERYTHEMATOSUS, DISCOID 06/05/2008   Arm pain 05/30/2008   Hyperlipidemia 06/07/2007   Obesity 06/07/2007   Malignant hypertension 06/07/2007   Type 2 diabetes mellitus with  hyperglycemia (HCC) 06/07/2007   Diabetes mellitus (HCC) 06/07/2007   Past Medical History:  Diagnosis Date   Acid reflux    Amputated left leg (HCC)    Anemia    Arthritis    Axillary masses    Soft tissue - status post excision   Back pain    Cancer (HCC)    CHF (congestive heart failure) (HCC)    COVID-19 virus infection 04/06/2019   Depression    End-stage renal disease (HCC)    M/W/F dialysis   Essential hypertension    Headache    years ago   History of blood transfusion    History of cardiac catheterization    Normal coronary arteries October 2020   History of claustrophobia    History of pneumonia 2019   Hypoxia 04/03/2019   Memory loss    Mixed hyperlipidemia    Obesity    Pancreatitis    Peritoneal dialysis catheter in place [facility])    Pneumonia due to COVID-19 virus 04/02/2019   Sleep apnea    Noncompliant with CPAP   Stroke (HCC)    mini stroke   Type 2 diabetes mellitus (HCC)     Family History  Problem Relation Age of Onset   Hypertension Father    Hypercholesterolemia Father    Arthritis Father    Hypertension Sister    Hypercholesterolemia Sister    Breast cancer Sister    Hypertension Sister    Colon cancer Neg Hx    Colon polyps Neg Hx     Past Surgical History:  Procedure Laterality Date   ABDOMINAL AORTOGRAM W/LOWER EXTREMITY N/A 04/30/2022   Procedure: ABDOMINAL AORTOGRAM W/LOWER EXTREMITY;  Surgeon: Leonie Douglas, MD;  Location: [facility] LAB;  Service: Cardiovascular;  Laterality: N/A;   ABDOMINAL AORTOGRAM W/LOWER EXTREMITY N/A 07/21/2022   Procedure: ABDOMINAL AORTOGRAM W/LOWER EXTREMITY;  Surgeon: Nada Libman, MD;  Location: [facility];  Service: Cardiovascular;  Laterality: N/A;   ABDOMINAL HYSTERECTOMY     ACHILLES TENDON LENGTHENING  08/15/2022   Procedure: ACHILLES TENDON LENGTHENING;  Surgeon: [name], DPM;  Location: [facility] OR;  Service: Podiatry;;   AMPUTATION Right 05/29/2022   Procedure: RIGHT BELOW THE  KNEE  AMPUTATION;  Surgeon: [name], MD;  Location: [facility] OR;  Service: Orthopedics;  Laterality: Right;   AMPUTATION Left 09/04/2022   Procedure: AMPUTATION FOOT, serial irrigation;  Surgeon: Louann Sjogren, DPM;  Location: [facility] OR;  Service: Podiatry;  Laterality: Left;  Surgical team to do block   AMPUTATION Left 10/07/2022   Procedure: LEFT BELOW KNEE AMPUTATION;  Surgeon: [name], MD;  Location: [facility] OR;  Service: Orthopedics;  Laterality: Left;   AV FISTULA PLACEMENT Left 09/02/2017   Procedure: creation of left arm ARTERIOVENOUS (AV) FISTULA;  Surgeon: Nada Libman, MD;  Location: [facility] OR;  Service: Vascular;  Laterality: Left;   COLONOSCOPY  2008   Dr. Darrick Penna: normal    COLONOSCOPY N/A 12/18/2016   Dr. Darrick Penna: multiple tubular adenomas, internal hemorrhoids. Surveillance in 3 years    ESOPHAGEAL DILATION N/A 10/13/2015   Procedure: ESOPHAGEAL DILATION;  Surgeon: Malissa Hippo, MD;  [facility];  Service: Endoscopy;  Laterality: N/A;   ESOPHAGOGASTRODUODENOSCOPY N/A 10/13/2015   Dr. Karilyn Cota: chronic gastritis on path, no H.pylori. Empiric dilation    ESOPHAGOGASTRODUODENOSCOPY N/A 12/18/2016   Dr. Darrick Penna: mild gastritis. BRAVO study revealed uncontrolled GERD. Dysphagia secondary to uncontrolled reflux   FOOT SURGERY Bilateral    "nerve"     LEFT HEART CATH AND CORONARY ANGIOGRAPHY N/A 12/29/2018   Procedure: LEFT HEART CATH AND CORONARY ANGIOGRAPHY;  Surgeon: Corky Crafts, MD;  Location: [facility] INVASIVE CV LAB;  Service: Cardiovascular;  Laterality: N/A;   LOWER EXTREMITY ANGIOGRAPHY Right 05/04/2022   Procedure: Lower Extremity Angiography;  Surgeon: Victorino Sparrow, MD;  Location: [facility] INVASIVE CV LAB;  Service: Cardiovascular;  Laterality: Right;   LUNG BIOPSY     MASS EXCISION Right 01/09/2013   Procedure: EXCISION OF NEOPLASM OF RIGHT  AXILLA  AND EXCISION OF NEOPLASM OF LEFT AXILLA;  Surgeon: Dalia Heading, MD;  Location: [facility] ORS;  Service: General;  Laterality:  Right;  procedure end @ 08:23   MYRINGOTOMY WITH TUBE PLACEMENT Bilateral 04/28/2017   Procedure: BILATERAL MYRINGOTOMY WITH TUBE PLACEMENT;  Surgeon: Newman Pies, MD;  Location: [facility] OR;  Service: ENT;  Laterality: Bilateral;   PERIPHERAL VASCULAR BALLOON ANGIOPLASTY Right 05/04/2022   Procedure: PERIPHERAL VASCULAR BALLOON ANGIOPLASTY;  Surgeon: Victorino Sparrow, MD;  Location: [facility] INVASIVE CV LAB;  Service: Cardiovascular;  Laterality: Right;  PT   PERIPHERAL VASCULAR INTERVENTION Right 05/04/2022   Procedure: PERIPHERAL VASCULAR INTERVENTION;  Surgeon: Victorino Sparrow, MD;  Location: [facility] INVASIVE CV LAB;  Service: Cardiovascular;  Laterality: Right;  SFA   PERIPHERAL VASCULAR INTERVENTION Left 07/21/2022   Procedure: PERIPHERAL VASCULAR INTERVENTION;  Surgeon: Nada Libman, MD;  [facility];  Service: Cardiovascular;  Laterality: Left;   REVISION OF ARTERIOVENOUS GORETEX GRAFT Left 05/04/2018   Procedure: TRANSPOSITION OF CEPHALIC VEIN ARTERIOVENOUS FISTULA LEFT ARM;  Surgeon: Larina Earthly, MD;  Location: [facility] OR;  Service: Vascular;  Laterality: Left;   SAVORY DILATION N/A 12/18/2016   Procedure: SAVORY DILATION;  Surgeon: West Bali, MD;  [facility];  Service: Endoscopy;  Laterality: N/A;   TRANSMETATARSAL AMPUTATION Left 08/15/2022   Procedure: TRANSMETATARSAL AMPUTATION;  Surgeon: [name], DPM;  Location: [facility] OR;  Service: Podiatry;  Laterality: Left;   Social History   Occupational History   Occupation: retired   Tobacco Use   Smoking status: Never    Passive exposure: Never   Smokeless tobacco: Never   Tobacco comments:    Verified  by Daughter, [name]  Vaping Use   Vaping status: Never Used  Substance and Sexual Activity   Alcohol use: No   Drug use: No   Sexual activity: Not Currently    Partners: Male

## 2022-11-27 ENCOUNTER — Ambulatory Visit: Payer: Self-pay | Admitting: *Deleted

## 2022-11-27 ENCOUNTER — Encounter: Payer: Self-pay | Admitting: *Deleted

## 2022-11-27 DIAGNOSIS — N2581 Secondary hyperparathyroidism of renal origin: Secondary | ICD-10-CM | POA: Diagnosis not present

## 2022-11-27 DIAGNOSIS — N186 End stage renal disease: Secondary | ICD-10-CM | POA: Diagnosis not present

## 2022-11-27 DIAGNOSIS — Z992 Dependence on renal dialysis: Secondary | ICD-10-CM | POA: Diagnosis not present

## 2022-11-27 NOTE — Patient Instructions (Signed)
Visit Information  Thank you for taking time to visit with me today. Please don't hesitate to contact me if I can be of assistance to you.   Following are the goals we discussed today:   Goals Addressed             This Visit's Progress    Receive Assistance Applying for Aid & Attendance Benefits, through [employer].   On track    Care Coordination Interventions:  Interventions Today    Flowsheet Row Most Recent Value  Chronic Disease   Chronic disease during today's visit Diabetes, Hypertension (HTN), Chronic Kidney Disease/End Stage Renal Disease (ESRD), Congestive Heart Failure (CHF), Other  [Neuropathy, Chronic Pain Syndrome, Anxiety, Depression, Memory Loss, Neurocognitive Disorder & Requires Maximum Assistance with Activities of Daily Living]  General Interventions   General Interventions Discussed/Reviewed General Interventions Reviewed, Annual Eye Exam, General Interventions Discussed, Labs, Annual Foot Exam, Lipid Profile, Durable Medical Equipment (DME), Vaccines, Health Screening, [employer], Doctor Visits, Communication with, Level of Care  [Primary Care Provider]  Labs Hgb A1c every 3 months, Kidney Function  Vaccines COVID-19, Flu, Pneumonia, RSV, Shingles, Tetanus/Pertussis/Diphtheria  [Encouraged]  Doctor Visits Discussed/Reviewed Doctor Visits Discussed, Doctor Visits Reviewed, Annual Wellness Visits, PCP, Specialist  [Encouraged]  Health Screening Colonoscopy, Mammogram  [Encouraged]  Durable Medical Equipment (DME) BP Cuff, Glucomoter, Environmental consultant, Wheelchair, Physicist, medical  PCP/Specialist Visits Compliance with follow-up visit  Communication with PCP/Specialists, RN  Level of Care Adult Daycare, Air traffic controller, Assisted Living, Skilled Nursing Facility, Teaching laboratory technician Medicaid, Personal Care Services  Exercise Interventions   Exercise Discussed/Reviewed Exercise Discussed, Exercise Reviewed, Physical  Activity, Assistive device use and maintanence  Physical Activity Discussed/Reviewed Physical Activity Discussed, Physical Activity Reviewed, Types of exercise, Home Exercise Program (HEP)  [Encouraged]  Education Interventions   Education Provided Provided Therapist, sports, Provided Web-based Education, Provided Education  Provided Verbal Education On Nutrition, Foot Care, Eye Care, Labs, Blood Sugar Monitoring, Mental Health/Coping with Illness, Applications, Exercise, Medication, When to see the doctor, [employer], Human resources officer, Personal Care Services  Mental Health Interventions   Mental Health Discussed/Reviewed Mental Health Discussed, Mental Health Reviewed, Coping Strategies, Crisis, Anxiety, Depression, Grief and Loss, Substance Abuse, Suicide  Nutrition Interventions   Nutrition Discussed/Reviewed Nutrition Discussed, Nutrition Reviewed, Adding fruits and vegetables, Fluid intake, Decreasing sugar intake, Increaing proteins, Decreasing fats, Decreasing salt  Pharmacy Interventions   Pharmacy Dicussed/Reviewed Pharmacy Topics Discussed, Pharmacy Topics Reviewed, Medication Adherence, Affording Medications, Referral to Pharmacist  Medication Adherence Unable to refill medication  Referral to Pharmacist Cannot afford medications  Safety Interventions   Safety Discussed/Reviewed Safety Discussed, Safety Reviewed, Fall Risk, Home Safety  Home Safety Assistive Devices, Need for home safety assessment, Refer for home visit, Contact provider for referral to PT/OT, Refer for community resources, Contact home health agency  Advanced Directive Interventions   Advanced Directives Discussed/Reviewed Advanced Directives Discussed, Advanced Directives Reviewed, Advanced Care Planning, Provided resource for acquiring and filling out documents      Active Listening & Reflection Utilized.  Emotional Support Provided. Verbalization of Feelings Encouraged. Caregiver  Stress, Burnout, & Fatigue Acknowledged.  Solution-Focused Strategies Employed. Problem Solving Interventions Implemented. Task-Centered Solutions Activated.  Cognitive Behavioral Therapy Conducted.  Client-Centered Therapy Performed Performed. Acceptance & Commitment Therapy Initiated. Encouraged Increased Level of Activity & Exercise, as Tolerated. Encouraged Administration of Medications, Exactly as Prescribed. Encouraged to Decrease Zoloft to 25 MG, PO, Daily for 2 Weeks, Then Discontinue Use, for Treatment of Anxiety &  Depression. Encouraged Administration of Lyrica 50 MG, PO, Daily, for Treatment of Nerve Pain. CSW Collaboration with Daughter, [name] to Confirm Patient's Continued Engagement with Home Health Skilled Nurse, Physical Therapist, & Occupational Therapist, through [facility] [phone]). CSW Collaboration with Daughter, [name] to Encourage Routine Contact with Representative from [facility] [phone]), to Check Status of Application for Aid & Attendance Benefits.  CSW Collaboration with Daughter, [name] to Encourage Attendance at Follow-Up Appointment for Patient with Dr. Carlus Pavlov, Endocrinologist with [facility] [phone]), Scheduled on 12/15/2022 at 11:20 AM. CSW Collaboration with Daughter, [name] to [employer] with CSW [phone]), if She Has Questions, Needs Assistance, or If Additional Social Work Needs Are Identified Between Now & Our Next Follow-Up Outreach Call, Scheduled on 12/16/2022 at 9:45 AM. CSW Collaboration with Daughter, [name] to Encourage Attendance at Follow-Up Appointment for Patient with Dr. Aldean Baker, Orthopedic Surgeon with [facility] [phone]), Scheduled on 12/31/2022 at 9:45 AM. CSW Collaboration with Daughter, [name] to Encourage Attendance at Follow-Up Appointment for Patient  with Dr. Syliva Overman, Primary Care Provider with [facility] [phone]), Scheduled on 01/14/2023 at 10:40 AM.      Our next appointment is by telephone on 12/16/2022 at 9:45 am.  Please call the care guide team at [phone] if you need to cancel or reschedule your appointment.   If you are experiencing a Mental Health or Behavioral Health Crisis or need someone to talk to, please call the Suicide and Crisis Lifeline: 988 call the [place] National Suicide Prevention Lifeline: (669)394-1713 or TTY: 662-437-4796 TTY 915-508-9743) to talk to a trained counselor call 1-800-273-TALK (toll free, 24 hour hotline) go to [facility] [address], [place] [phone]) call the [facility] Crisis Line: [phone] call 911  Patient verbalizes understanding of instructions and care plan provided today and agrees to view in MyChart. Active MyChart status and patient understanding of how to access instructions and care plan via MyChart confirmed with patient.     Telephone follow up appointment with care management team member scheduled for:   12/16/2022 at 9:45 am.  Danford Bad, BSW, MSW, LCSW  Embedded Practice Social Work Case Manager  [facility], Population Health Direct Dial: [phone]  Fax: [phone] Email: [email or web address]: [email or web address]

## 2022-11-27 NOTE — Patient Outreach (Signed)
Care Coordination   Follow Up Visit Note   11/27/2022  Name: Kathryn Beck MRN: [id number] DOB: October 07, 1955  Kathryn Beck is a 67 y.o. year old female who sees [name], MD for primary care. I spoke with patient's daughter, [name] by phone today.  What matters to the patients health and wellness today?  Receive Assistance Applying for Aid & Attendance Benefits, through [employer].   Goals Addressed             This Visit's Progress    Receive Assistance Applying for Aid & Attendance Benefits, through [employer].   On track    Care Coordination Interventions:  Interventions Today    Flowsheet Row Most Recent Value  Chronic Disease   Chronic disease during today's visit Diabetes, Hypertension (HTN), Chronic Kidney Disease/End Stage Renal Disease (ESRD), Congestive Heart Failure (CHF), Other  [Neuropathy, Chronic Pain Syndrome, Anxiety, Depression, Memory Loss, Neurocognitive Disorder & Requires Maximum Assistance with Activities of Daily Living]  General Interventions   General Interventions Discussed/Reviewed General Interventions Reviewed, Annual Eye Exam, General Interventions Discussed, Labs, Annual Foot Exam, Lipid Profile, Durable Medical Equipment (DME), Vaccines, Health Screening, [employer], Doctor Visits, Communication with, Level of Care  [Primary Care Provider]  Labs Hgb A1c every 3 months, Kidney Function  Vaccines COVID-19, Flu, Pneumonia, RSV, Shingles, Tetanus/Pertussis/Diphtheria  [Encouraged]  Doctor Visits Discussed/Reviewed Doctor Visits Discussed, Doctor Visits Reviewed, Annual Wellness Visits, PCP, Specialist  [Encouraged]  Health Screening Colonoscopy, Mammogram  [Encouraged]  Durable Medical Equipment (DME) BP Cuff, Glucomoter, Environmental consultant, Wheelchair, Physicist, medical  PCP/Specialist Visits Compliance with follow-up visit  Communication with PCP/Specialists, RN  Level of Care Adult  Daycare, Air traffic controller, Assisted Living, Skilled Nursing Facility, Teaching laboratory technician Medicaid, Personal Care Services  Exercise Interventions   Exercise Discussed/Reviewed Exercise Discussed, Exercise Reviewed, Physical Activity, Assistive device use and maintanence  Physical Activity Discussed/Reviewed Physical Activity Discussed, Physical Activity Reviewed, Types of exercise, Home Exercise Program (HEP)  [Encouraged]  Education Interventions   Education Provided Provided Therapist, sports, Provided Web-based Education, Provided Education  Provided Verbal Education On Nutrition, Foot Care, Eye Care, Labs, Blood Sugar Monitoring, Mental Health/Coping with Illness, Applications, Exercise, Medication, When to see the doctor, [employer], Human resources officer, Personal Care Services  Mental Health Interventions   Mental Health Discussed/Reviewed Mental Health Discussed, Mental Health Reviewed, Coping Strategies, Crisis, Anxiety, Depression, Grief and Loss, Substance Abuse, Suicide  Nutrition Interventions   Nutrition Discussed/Reviewed Nutrition Discussed, Nutrition Reviewed, Adding fruits and vegetables, Fluid intake, Decreasing sugar intake, Increaing proteins, Decreasing fats, Decreasing salt  Pharmacy Interventions   Pharmacy Dicussed/Reviewed Pharmacy Topics Discussed, Pharmacy Topics Reviewed, Medication Adherence, Affording Medications, Referral to Pharmacist  Medication Adherence Unable to refill medication  Referral to Pharmacist Cannot afford medications  Safety Interventions   Safety Discussed/Reviewed Safety Discussed, Safety Reviewed, Fall Risk, Home Safety  Home Safety Assistive Devices, Need for home safety assessment, Refer for home visit, Contact provider for referral to PT/OT, Refer for community resources, Contact home health agency  Advanced Directive Interventions   Advanced Directives Discussed/Reviewed Advanced Directives Discussed,  Advanced Directives Reviewed, Advanced Care Planning, Provided resource for acquiring and filling out documents      Active Listening & Reflection Utilized.  Emotional Support Provided. Verbalization of Feelings Encouraged. Caregiver Stress, Burnout, & Fatigue Acknowledged.  Solution-Focused Strategies Employed. Problem Solving Interventions Implemented. Task-Centered Solutions Activated.  Cognitive Behavioral Therapy Conducted.  Client-Centered Therapy Performed Performed. Acceptance & Commitment Therapy Initiated.  Encouraged Increased Level of Activity & Exercise, as Tolerated. Encouraged Administration of Medications, Exactly as Prescribed. Encouraged to Decrease Zoloft to 25 MG, PO, Daily for 2 Weeks, Then Discontinue Use, for Treatment of Anxiety & Depression. Encouraged Administration of Lyrica 50 MG, PO, Daily, for Treatment of Nerve Pain. CSW Collaboration with Daughter, [name] to Confirm Patient's Continued Engagement with Home Health Skilled Nurse, Physical Therapist, & Occupational Therapist, through [facility] [phone]). CSW Collaboration with Daughter, [name] to Encourage Routine Contact with Representative from [facility] [phone]), to Check Status of Application for Aid & Attendance Benefits.  CSW Collaboration with Daughter, [name] to Encourage Attendance at Follow-Up Appointment for Patient with Dr. Carlus Pavlov, Endocrinologist with [facility] [phone]), Scheduled on 12/15/2022 at 11:20 AM. CSW Collaboration with Daughter, [name] to [employer] with CSW [phone]), if She Has Questions, Needs Assistance, or If Additional Social Work Needs Are Identified Between Now & Our Next Follow-Up Outreach Call, Scheduled on 12/16/2022 at 9:45 AM. CSW Collaboration with Daughter, [name] to Encourage Attendance at Follow-Up Appointment for Patient with  Dr. Aldean Baker, Orthopedic Surgeon with [facility] [phone]), Scheduled on 12/31/2022 at 9:45 AM. CSW Collaboration with Daughter, [name] to Encourage Attendance at Follow-Up Appointment for Patient with Dr. Syliva Overman, Primary Care Provider with [facility] [phone]), Scheduled on 01/14/2023 at 10:40 AM.      SDOH assessments and interventions completed:  Yes.  Care Coordination Interventions:  Yes, provided.   Follow up plan: Follow up call scheduled for 12/16/2022 at 9:45 am.  Encounter Outcome:  Patient Visit Completed.   Danford Bad, BSW, MSW, Printmaker Social Work Case Set designer Health  [facility], Population Health Direct Dial: [phone]  Fax: [phone] Email: [email or web address] Website: [email or web address]

## 2022-11-29 DIAGNOSIS — M6281 Muscle weakness (generalized): Secondary | ICD-10-CM | POA: Diagnosis not present

## 2022-11-29 DIAGNOSIS — R296 Repeated falls: Secondary | ICD-10-CM | POA: Diagnosis not present

## 2022-11-29 DIAGNOSIS — Z992 Dependence on renal dialysis: Secondary | ICD-10-CM | POA: Diagnosis not present

## 2022-11-29 DIAGNOSIS — Z89511 Acquired absence of right leg below knee: Secondary | ICD-10-CM | POA: Diagnosis not present

## 2022-11-29 DIAGNOSIS — E1140 Type 2 diabetes mellitus with diabetic neuropathy, unspecified: Secondary | ICD-10-CM | POA: Diagnosis not present

## 2022-11-29 DIAGNOSIS — N186 End stage renal disease: Secondary | ICD-10-CM | POA: Diagnosis not present

## 2022-11-29 DIAGNOSIS — I5032 Chronic diastolic (congestive) heart failure: Secondary | ICD-10-CM | POA: Diagnosis not present

## 2022-11-29 DIAGNOSIS — R2689 Other abnormalities of gait and mobility: Secondary | ICD-10-CM | POA: Diagnosis not present

## 2022-11-29 DIAGNOSIS — Z4781 Encounter for orthopedic aftercare following surgical amputation: Secondary | ICD-10-CM | POA: Diagnosis not present

## 2022-11-30 DIAGNOSIS — Z992 Dependence on renal dialysis: Secondary | ICD-10-CM | POA: Diagnosis not present

## 2022-11-30 DIAGNOSIS — N2581 Secondary hyperparathyroidism of renal origin: Secondary | ICD-10-CM | POA: Diagnosis not present

## 2022-11-30 DIAGNOSIS — N186 End stage renal disease: Secondary | ICD-10-CM | POA: Diagnosis not present

## 2022-12-01 DIAGNOSIS — I5032 Chronic diastolic (congestive) heart failure: Secondary | ICD-10-CM | POA: Diagnosis not present

## 2022-12-01 DIAGNOSIS — I132 Hypertensive heart and chronic kidney disease with heart failure and with stage 5 chronic kidney disease, or end stage renal disease: Secondary | ICD-10-CM | POA: Diagnosis not present

## 2022-12-01 DIAGNOSIS — E1122 Type 2 diabetes mellitus with diabetic chronic kidney disease: Secondary | ICD-10-CM | POA: Diagnosis not present

## 2022-12-01 DIAGNOSIS — D631 Anemia in chronic kidney disease: Secondary | ICD-10-CM | POA: Diagnosis not present

## 2022-12-01 DIAGNOSIS — E1151 Type 2 diabetes mellitus with diabetic peripheral angiopathy without gangrene: Secondary | ICD-10-CM | POA: Diagnosis not present

## 2022-12-01 DIAGNOSIS — M62522 Muscle wasting and atrophy, not elsewhere classified, left upper arm: Secondary | ICD-10-CM | POA: Diagnosis not present

## 2022-12-01 DIAGNOSIS — N186 End stage renal disease: Secondary | ICD-10-CM | POA: Diagnosis not present

## 2022-12-01 DIAGNOSIS — M62521 Muscle wasting and atrophy, not elsewhere classified, right upper arm: Secondary | ICD-10-CM | POA: Diagnosis not present

## 2022-12-01 DIAGNOSIS — Z4781 Encounter for orthopedic aftercare following surgical amputation: Secondary | ICD-10-CM | POA: Diagnosis not present

## 2022-12-02 DIAGNOSIS — M62521 Muscle wasting and atrophy, not elsewhere classified, right upper arm: Secondary | ICD-10-CM | POA: Diagnosis not present

## 2022-12-02 DIAGNOSIS — E1151 Type 2 diabetes mellitus with diabetic peripheral angiopathy without gangrene: Secondary | ICD-10-CM | POA: Diagnosis not present

## 2022-12-02 DIAGNOSIS — N186 End stage renal disease: Secondary | ICD-10-CM | POA: Diagnosis not present

## 2022-12-02 DIAGNOSIS — I132 Hypertensive heart and chronic kidney disease with heart failure and with stage 5 chronic kidney disease, or end stage renal disease: Secondary | ICD-10-CM | POA: Diagnosis not present

## 2022-12-02 DIAGNOSIS — I5032 Chronic diastolic (congestive) heart failure: Secondary | ICD-10-CM | POA: Diagnosis not present

## 2022-12-02 DIAGNOSIS — Z4781 Encounter for orthopedic aftercare following surgical amputation: Secondary | ICD-10-CM | POA: Diagnosis not present

## 2022-12-02 DIAGNOSIS — D631 Anemia in chronic kidney disease: Secondary | ICD-10-CM | POA: Diagnosis not present

## 2022-12-02 DIAGNOSIS — M62522 Muscle wasting and atrophy, not elsewhere classified, left upper arm: Secondary | ICD-10-CM | POA: Diagnosis not present

## 2022-12-02 DIAGNOSIS — N2581 Secondary hyperparathyroidism of renal origin: Secondary | ICD-10-CM | POA: Diagnosis not present

## 2022-12-02 DIAGNOSIS — E1122 Type 2 diabetes mellitus with diabetic chronic kidney disease: Secondary | ICD-10-CM | POA: Diagnosis not present

## 2022-12-02 DIAGNOSIS — Z992 Dependence on renal dialysis: Secondary | ICD-10-CM | POA: Diagnosis not present

## 2022-12-03 DIAGNOSIS — E1151 Type 2 diabetes mellitus with diabetic peripheral angiopathy without gangrene: Secondary | ICD-10-CM | POA: Diagnosis not present

## 2022-12-03 DIAGNOSIS — D631 Anemia in chronic kidney disease: Secondary | ICD-10-CM | POA: Diagnosis not present

## 2022-12-03 DIAGNOSIS — I5032 Chronic diastolic (congestive) heart failure: Secondary | ICD-10-CM | POA: Diagnosis not present

## 2022-12-03 DIAGNOSIS — I132 Hypertensive heart and chronic kidney disease with heart failure and with stage 5 chronic kidney disease, or end stage renal disease: Secondary | ICD-10-CM | POA: Diagnosis not present

## 2022-12-03 DIAGNOSIS — M62522 Muscle wasting and atrophy, not elsewhere classified, left upper arm: Secondary | ICD-10-CM | POA: Diagnosis not present

## 2022-12-03 DIAGNOSIS — E1122 Type 2 diabetes mellitus with diabetic chronic kidney disease: Secondary | ICD-10-CM | POA: Diagnosis not present

## 2022-12-03 DIAGNOSIS — Z4781 Encounter for orthopedic aftercare following surgical amputation: Secondary | ICD-10-CM | POA: Diagnosis not present

## 2022-12-03 DIAGNOSIS — N186 End stage renal disease: Secondary | ICD-10-CM | POA: Diagnosis not present

## 2022-12-03 DIAGNOSIS — M62521 Muscle wasting and atrophy, not elsewhere classified, right upper arm: Secondary | ICD-10-CM | POA: Diagnosis not present

## 2022-12-04 DIAGNOSIS — N2581 Secondary hyperparathyroidism of renal origin: Secondary | ICD-10-CM | POA: Diagnosis not present

## 2022-12-04 DIAGNOSIS — Z992 Dependence on renal dialysis: Secondary | ICD-10-CM | POA: Diagnosis not present

## 2022-12-04 DIAGNOSIS — N186 End stage renal disease: Secondary | ICD-10-CM | POA: Diagnosis not present

## 2022-12-07 ENCOUNTER — Encounter: Payer: Self-pay | Admitting: Family Medicine

## 2022-12-07 DIAGNOSIS — Z992 Dependence on renal dialysis: Secondary | ICD-10-CM | POA: Diagnosis not present

## 2022-12-07 DIAGNOSIS — N2581 Secondary hyperparathyroidism of renal origin: Secondary | ICD-10-CM | POA: Diagnosis not present

## 2022-12-07 DIAGNOSIS — N186 End stage renal disease: Secondary | ICD-10-CM | POA: Diagnosis not present

## 2022-12-07 MED ORDER — ONDANSETRON HCL 4 MG PO TABS: 4.0000 mg | ORAL_TABLET | Freq: Three times a day (TID) | ORAL | 0 refills | Status: DC | PRN

## 2022-12-09 ENCOUNTER — Telehealth (INDEPENDENT_AMBULATORY_CARE_PROVIDER_SITE_OTHER): Payer: Medicare PPO | Admitting: Family Medicine

## 2022-12-09 ENCOUNTER — Telehealth: Payer: Self-pay | Admitting: Family Medicine

## 2022-12-09 DIAGNOSIS — R630 Anorexia: Secondary | ICD-10-CM | POA: Diagnosis not present

## 2022-12-09 DIAGNOSIS — R410 Disorientation, unspecified: Secondary | ICD-10-CM

## 2022-12-09 DIAGNOSIS — R6883 Chills (without fever): Secondary | ICD-10-CM

## 2022-12-09 DIAGNOSIS — Z992 Dependence on renal dialysis: Secondary | ICD-10-CM | POA: Diagnosis not present

## 2022-12-09 DIAGNOSIS — N186 End stage renal disease: Secondary | ICD-10-CM | POA: Diagnosis not present

## 2022-12-09 DIAGNOSIS — N2581 Secondary hyperparathyroidism of renal origin: Secondary | ICD-10-CM | POA: Diagnosis not present

## 2022-12-09 NOTE — Progress Notes (Unsigned)
Virtual Visit via Video Note  I connected with Kathryn Beck on 12/10/22 at  4:00 PM EDT by a video enabled telemedicine application and verified that I am speaking with the correct person using two identifiers.  Location: Patient: home Provider: office   I discussed the limitations of evaluation and management by telemedicine and the availability of in person appointments. The patient expressed understanding and agreed to proceed.Hs   History of Present Illness: 4 day h/o confusion, aggressive behavior,  poor appetite, no known dorect sick contact Interview conducted with her daughter Kathryn Beck present and the primary historian Kathryn  Beck was I her usual state of health up until 4 days ago when she presented with the above complaints and deterioration in her overall health that triggered the call. On specific / direct questioning, no h/o sinus or drainage drainage, no cough, no N/V/D, no skin breakdown, incision sited clean , no redness warmth or drainage Pt on dialysis, little UO, unable to collect spcimen on day of visit as was recently dia lysed will submit Cath UA Iin the morning if able   Observations/Objective: There were no vitals taken for this visit. Chronically ill appearing Scant  communication No signs of respiratory distress during speech   Assessment and Plan:  Acute confusion No clear source f infection, need CCUAtolook for possible UTI,will treat if abn and ssend for c/S . Unfortunately urine quantity voided too scant to send for tesing. Pt neds to go to the Ed for evaluation   Poor appetite Noted I the past 5 days, unclear of uderlying illness causintg this , if CCUA shows no infection will need ED eval  Chills New onset x 5 days, needs w/up froinfection, will start with urinalysis , treat for presumed urinary tract infection and send for c/s. If normalUA needs ED eval  Follow Up Instructions:    I discussed the assessment and treatment plan with the  patient. The patient was provided an opportunity to ask questions and all were answered. The patient agreed with the plan and demonstrated an understanding of the instructions.   The patient was advised to call back or seek an in-person evaluation if the symptoms worsen or if the condition fails to improve as anticipated.  I provided 15 minutes of non-face-to-face time during this encounter.   Kathryn Overman, MD

## 2022-12-09 NOTE — Patient Instructions (Signed)
You Need to submit Catheter urine specimen in the morning to be checked for infection, come at 8 am to collect the cathter  please  Due to reported symptoms , I agree with your concern, and we need to find out the underlying problem and treat as soon as possible  If urine does not  suggest infection , you will need to go t the ED for evaluation of your new onset confusion and poor appetite  with chills/  fever possibly, we will let you know the urine result  within 1 hr of leaving it with [place]  If your symptoms worsen , you need to go to th ED

## 2022-12-10 ENCOUNTER — Encounter: Payer: Self-pay | Admitting: Family Medicine

## 2022-12-10 DIAGNOSIS — R630 Anorexia: Secondary | ICD-10-CM | POA: Insufficient documentation

## 2022-12-10 DIAGNOSIS — E1151 Type 2 diabetes mellitus with diabetic peripheral angiopathy without gangrene: Secondary | ICD-10-CM | POA: Diagnosis not present

## 2022-12-10 DIAGNOSIS — Z4781 Encounter for orthopedic aftercare following surgical amputation: Secondary | ICD-10-CM | POA: Diagnosis not present

## 2022-12-10 DIAGNOSIS — R6883 Chills (without fever): Secondary | ICD-10-CM | POA: Insufficient documentation

## 2022-12-10 DIAGNOSIS — N186 End stage renal disease: Secondary | ICD-10-CM | POA: Diagnosis not present

## 2022-12-10 DIAGNOSIS — M62521 Muscle wasting and atrophy, not elsewhere classified, right upper arm: Secondary | ICD-10-CM | POA: Diagnosis not present

## 2022-12-10 DIAGNOSIS — I5032 Chronic diastolic (congestive) heart failure: Secondary | ICD-10-CM | POA: Diagnosis not present

## 2022-12-10 DIAGNOSIS — I132 Hypertensive heart and chronic kidney disease with heart failure and with stage 5 chronic kidney disease, or end stage renal disease: Secondary | ICD-10-CM | POA: Diagnosis not present

## 2022-12-10 DIAGNOSIS — D631 Anemia in chronic kidney disease: Secondary | ICD-10-CM | POA: Diagnosis not present

## 2022-12-10 DIAGNOSIS — M62522 Muscle wasting and atrophy, not elsewhere classified, left upper arm: Secondary | ICD-10-CM | POA: Diagnosis not present

## 2022-12-10 DIAGNOSIS — R410 Disorientation, unspecified: Secondary | ICD-10-CM | POA: Insufficient documentation

## 2022-12-10 DIAGNOSIS — E1122 Type 2 diabetes mellitus with diabetic chronic kidney disease: Secondary | ICD-10-CM | POA: Diagnosis not present

## 2022-12-10 NOTE — Assessment & Plan Note (Signed)
New onset x 5 days, needs w/up froinfection, will start with urinalysis , treat for presumed urinary tract infection and send for c/s. If normalUA needs ED eval

## 2022-12-10 NOTE — Assessment & Plan Note (Signed)
Noted I the past 5 days, unclear of uderlying illness causintg this , if CCUA shows no infection will need ED eval

## 2022-12-10 NOTE — Assessment & Plan Note (Signed)
No clear source f infection, need CCUAtolook for possible UTI,will treat if abn and ssend for c/S . Unfortunately urine quantity voided too scant to send for tesing. Pt neds to go to the Ed for evaluation

## 2022-12-11 ENCOUNTER — Other Ambulatory Visit: Payer: Self-pay

## 2022-12-11 DIAGNOSIS — N2581 Secondary hyperparathyroidism of renal origin: Secondary | ICD-10-CM | POA: Diagnosis not present

## 2022-12-11 DIAGNOSIS — Z992 Dependence on renal dialysis: Secondary | ICD-10-CM | POA: Diagnosis not present

## 2022-12-11 DIAGNOSIS — N186 End stage renal disease: Secondary | ICD-10-CM | POA: Diagnosis not present

## 2022-12-11 DIAGNOSIS — R410 Disorientation, unspecified: Secondary | ICD-10-CM

## 2022-12-11 NOTE — Telephone Encounter (Signed)
Patient daughter aware.

## 2022-12-11 NOTE — Telephone Encounter (Signed)
Daughter called in LVM in regard to previous mychart message   Wants a cll back

## 2022-12-14 DIAGNOSIS — N186 End stage renal disease: Secondary | ICD-10-CM | POA: Diagnosis not present

## 2022-12-14 DIAGNOSIS — Z992 Dependence on renal dialysis: Secondary | ICD-10-CM | POA: Diagnosis not present

## 2022-12-14 DIAGNOSIS — N2581 Secondary hyperparathyroidism of renal origin: Secondary | ICD-10-CM | POA: Diagnosis not present

## 2022-12-15 ENCOUNTER — Ambulatory Visit (INDEPENDENT_AMBULATORY_CARE_PROVIDER_SITE_OTHER): Payer: Medicare PPO | Admitting: Internal Medicine

## 2022-12-15 ENCOUNTER — Encounter: Payer: Self-pay | Admitting: Internal Medicine

## 2022-12-15 VITALS — BP 132/80 | HR 62 | Resp 20 | Ht 60.0 in

## 2022-12-15 DIAGNOSIS — E782 Mixed hyperlipidemia: Secondary | ICD-10-CM

## 2022-12-15 DIAGNOSIS — D352 Benign neoplasm of pituitary gland: Secondary | ICD-10-CM

## 2022-12-15 DIAGNOSIS — R7989 Other specified abnormal findings of blood chemistry: Secondary | ICD-10-CM

## 2022-12-15 DIAGNOSIS — I132 Hypertensive heart and chronic kidney disease with heart failure and with stage 5 chronic kidney disease, or end stage renal disease: Secondary | ICD-10-CM | POA: Diagnosis not present

## 2022-12-15 DIAGNOSIS — M62522 Muscle wasting and atrophy, not elsewhere classified, left upper arm: Secondary | ICD-10-CM | POA: Diagnosis not present

## 2022-12-15 DIAGNOSIS — N186 End stage renal disease: Secondary | ICD-10-CM | POA: Diagnosis not present

## 2022-12-15 DIAGNOSIS — E1151 Type 2 diabetes mellitus with diabetic peripheral angiopathy without gangrene: Secondary | ICD-10-CM | POA: Diagnosis not present

## 2022-12-15 DIAGNOSIS — E1122 Type 2 diabetes mellitus with diabetic chronic kidney disease: Secondary | ICD-10-CM

## 2022-12-15 DIAGNOSIS — D3500 Benign neoplasm of unspecified adrenal gland: Secondary | ICD-10-CM

## 2022-12-15 DIAGNOSIS — M62521 Muscle wasting and atrophy, not elsewhere classified, right upper arm: Secondary | ICD-10-CM | POA: Diagnosis not present

## 2022-12-15 DIAGNOSIS — D631 Anemia in chronic kidney disease: Secondary | ICD-10-CM | POA: Diagnosis not present

## 2022-12-15 DIAGNOSIS — Z4781 Encounter for orthopedic aftercare following surgical amputation: Secondary | ICD-10-CM | POA: Diagnosis not present

## 2022-12-15 DIAGNOSIS — Z794 Long term (current) use of insulin: Secondary | ICD-10-CM

## 2022-12-15 DIAGNOSIS — I5032 Chronic diastolic (congestive) heart failure: Secondary | ICD-10-CM | POA: Diagnosis not present

## 2022-12-15 DIAGNOSIS — R410 Disorientation, unspecified: Secondary | ICD-10-CM | POA: Diagnosis not present

## 2022-12-15 LAB — POCT GLYCOSYLATED HEMOGLOBIN (HGB A1C): Hemoglobin A1C: 7.7 % — AB (ref 4.0–5.6)

## 2022-12-15 NOTE — Patient Instructions (Addendum)
Please continue: - Toujeo 30 units daily - evening  Add: - Fiasp:  - 10 - 20 units before meals (may skip Fiasp with b'fast before dialysis) Use the following FiAsp Sliding scale: - 150-175: + 1 unit  - 176-200: + 2 units  - 201-225: + 3 units  - 226-250: + 4 units  - 251-275: + 5 units - 276-300: + 6 units   Please return in 3-4 months.

## 2022-12-15 NOTE — Progress Notes (Signed)
Patient ID: Kathryn Beck, female   DOB: [birth year], 67 y.o.   MRN: [id number]  HPI: Kathryn Beck is a 67 y.o.-year-old female, initially referred by her PCP, Dr. Lodema Hong, returning for follow-up for of DM2, dx in 2008, insulin-dependent since ~2015, uncontrolled, with complications (ESRD, PN). She saw Dr. Fransico Him - last OV with him 05/2016.  Last visit with me 4 months ago. She is here with her daughter.  History is obtained from patient along with daughter as patient is mostly nonverbal.  Interim history: She continues hemodialysis, previously on peritoneal dialysis.  She was previously on the kidney transplant but not anymore as she needs better control of hypertension and diabetes. She denies increased urination, blurry vision, nausea, chest pain.  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  At last visit she had a left foot ulcer and she ended up having left BKA 10/07/2022. She was in the emergency room with hypoglycemia on 11/20/2022 (while in HD - ? Value), but was previously admitted on 11/18/2022 after missing 2 hemodialysis sessions.  She had general weakness and pain and shortness of breath and found to have acute respiratory failure with hypoxia.  She also had a video visit with PCP few days ago for confusion, decreased appetite, chills. No fever.  DM2: Reviewed HbA1c levels: Lab Results  Component Value Date   HGBA1C 6.3 (H) 07/07/2022   HGBA1C 9.3 (A) 04/16/2022   HGBA1C 8.9 (A) 10/28/2021   HGBA1C 9.1 (A) 07/22/2021   HGBA1C 11.4 (A) 05/02/2021   HGBA1C 9.1 (A) 10/28/2020   HGBA1C 8.4 (A) 06/18/2020   HGBA1C 7.2 (A) 02/28/2020   HGBA1C 7.8 07/17/2019   HGBA1C 8.7 (H) 04/02/2019   HGBA1C 7.9 (A) 01/31/2019   HGBA1C 6.8 (A) 08/31/2018   HGBA1C 6.9 (H) 06/09/2018   HGBA1C 8.4 (H) 03/08/2018   HGBA1C 8.3 (H) 11/12/2017   HGBA1C 11.0 (H) 08/18/2017   HGBA1C 11.2 06/18/2017   HGBA1C 11.5 03/19/2017   HGBA1C 12.5 (H) 11/25/2016   HGBA1C 11.1 (H) 06/09/2016   11/01/2020: HbA1c 9.3% 02/09/2020: HbA1c 6.8%  In the past, pt. was on a regimen of: - Humalog 75/25 20-26 units before each meal, 2-3X a day depending on the CBG before the meal  - Levemir 100 units at bedtime  At last visit she was on: - Toujeo 54 >>  was only taking 34 units in the evening -but out for 2 months - Humalog >> on NovoLog 70/30 24-30 units 3-4x a day (!!!????) 24 units before a smaller meal 26-28 units before a regular meal 30 units before a larger meal Take Humalog EVERY TIME you eat, 15 min before the meal, if sugars are >70. - Humalog sliding scale: - 150-175: + 1 unit  - 176-200: + 2 units  - 201-225: + 3 units  - 226-250: + 4 units  - 251-275: + 5 units - 276-300: + 6 units  Try not to correct sugars at bedtime unless they are >300 and in that case, take no more than 4 units.  Currently on: - Toujeo 40 >> 60 >> 80 units in the evening >> they decreased the dose to 40 units in am >> 30 units daily - Fiasp: up to 10 min before a meal: >> off this >> 10-20 units before meals - 40 units before b'fast and lunch - 30-35 units before dinner  FiAsp Sliding scale: - 141-160: + 2 units  - 161-180: + 4 units  - 181-200: +  6 units  - 201-240: + 8 units  - 241-280: + 10 units  - >281: + 12 units  >> now: - not using Pt was checking her sugars 1-4x a day - now off the CGM:  Previously:   Lowest sugar was 31 ... >> 40 >> ? In HD; it is unclear at which level she has hypoglycemia awareness. Highest sugar was 519, HI >> HI >> 400s  Glucometer: Accuchek  Pt's meals are: - Breakfast: eggs, bacon, oatmeal, grits - Lunch: salads - Dinner: meat + veggie or sandwich + salad or nabs - Snacks: nabs, fruit cups, jello  -+ ESRD, on dialysis. On irbesartan. Last CMP:   Chemistry      Component Value Date/Time   NA 141 11/19/2022 0320   NA 142 07/07/2022 1140   K 4.0 11/19/2022 0320   CL 98 11/19/2022 0320   CO2 26 11/19/2022 0320   BUN 56 (H) 11/19/2022 0320    BUN 19 07/07/2022 1140   CREATININE 7.14 (H) 11/19/2022 0320   CREATININE 3.74 (H) 08/23/2017 1407   GLU 211 07/17/2019 1157      Component Value Date/Time   CALCIUM 8.9 11/19/2022 0320   CALCIUM 9.4 10/15/2017 1258   ALKPHOS 105 11/19/2022 0320   AST 19 11/19/2022 0320   ALT 50 (H) 11/19/2022 0320   BILITOT 0.5 11/19/2022 0320   BILITOT <0.2 07/07/2022 1140     -+ HL; last set of lipids: Lab Results  Component Value Date   CHOL 156 07/22/2022   HDL 26 (L) 07/22/2022   LDLCALC 70 07/22/2022   LDLDIRECT 120.0 11/21/2020   TRIG 301 (H) 07/22/2022   CHOLHDL 6.0 07/22/2022  Previously on Lipitor 40, now on Zetia due to transaminitis with statins.  - last eye exam was in 07/2021: no DR.  -No numbness but she has tingling in her feet.  She is on Neurontin 300 mg 3 times a day.  Last foot exam -08/06/2022-Dr. Ardelle Anton.  Adrenal masses.  Reviewed previous imaging reports: 08/23/2008: MRI of the abdomen with and without contrast: Left adrenal mass measuring 1.6 x 1.5 cm (previously measuring 1.4 x 1.6 cm on the CT scan from 2003 -stable in size),  Of note, she has an enlarging pancreatic head mass - pt reports a h/o pancreatitis.   10/10/2015: CT abdomen and pelvis without contrast: Stable 2.2 cm left adrenal nodule with Hounsfield unit measurements 28 unchanged likely a lipid poor adenoma  05/28/2017: MRI abd: Bilateral adrenal nodules with signal dropout on out of phase imaging, consistent with adenomas.   Example at 2.5 cm on the left and on the order of 1.6 cm on the right.  04/22/2018: MRI abd.: Stable 19 mm nodule in the left adrenal gland, unchanged since 2015, 19 mm otherwise negative.  Reviewed previous adrenal biochemical investigation: Dexamethasone suppression test showed a low normal, nonsuppressed cortisol, but I suspect that this is due to her end-stage renal disease and dialysis status, rather than increased production. Component     Latest Ref Rng & Units 08/31/2018   Cortisol - AM     mcg/dL 6.1  Dexamethasone, Serum     ng/dL 811   A 91-YNWG urine cortisol could not be done - on dialysis.  We checked a late-night salivary cortisol as this was elevated: Component     Latest Ref Rng & Units 12/26/2020 Draw date/time: 12/22/20  midnight   Salivary Cortisol Baseline     ug/dL 9.562  Salivary Cortisol 2nd Specimen  ug/dL Reference Range:  Children and Adults:  8:00a.m.:   0.025 - 0.600  Noon:      <0.010 - 0.330  4:00p.m.:   0.010 - 0.200  Midnight:  <0.010 - 0.090  0.634   Previous investigation was negative for hyperaldosteronism or pheochromocytoma: Component     Latest Ref Rng 10/28/2021  Potassium     3.5 - 5.1 mEq/L 3.7   Metanephrine, Pl     <=57 pg/mL <25   Normetanephrine, Pl     <=148 pg/mL 74   Total Metanephrines-Plasma     <=205 pg/mL 74   Epinephrine     pg/mL 31   Norepinephrine     pg/mL 736   Dopamine     pg/mL <10   ALDOSTERONE CANCELED   Renin Activity     0.25 - 5.82 ng/mL/h 0.46   ALDO / PRA Ratio CANCELED   HbA1c, POC (controlled diabetic range)     0.0 - 7.0 %   Total Catecholamines     pg/mL 767    Component     Latest Ref Rng & Units 11/30/2017 10/04/2018 11/21/2020  Epinephrine     pg/mL Undetectable 24 <20  Norepinephrine     pg/mL 99 167 (L) 332  Dopamine     pg/mL Undetectable <10 <10  Catecholamines, Total     pg/mL 99    Total Catecholamines     pg/mL  191 (L) 332  Metanephrine, Pl     <=57 pg/mL <25 <25 <25  Normetanephrine, Pl     <=148 pg/mL 71 38 56  Total Metanephrines-Plasma     <=205 pg/mL 71 38 56  ALDOSTERONE      ng/dL 3 2 <1  Renin Activity     0.25 - 5.82 ng/mL/h 1.20 1.48 0.28  ALDO / PRA Ratio      2.5 1.4 see note  Potassium     3.5 - 5.1 mEq/L 4.0 4.9 4.1   Since last visit, we tried to recheck her salivary cortisol but this was canceled by the lab.  After this, we rechecked a cortisol and ACTH and they returned elevated, with a normal DHEA-S:  Component      Latest Ref Rng 07/22/2021  Salivary Cortisol Baseline     ug/dL CANCELED    Cortisol, Plasma ug/dL 28.4   Comment: AM:  4.3 - 22.4 ug/dLPM:  3.1 - 16.7 ug/dL  Resulting Agency  [place] [facility]     C206 ACTH 6 - 50 pg/mL 85 High    Comment: .  Reference range applies only to specimens collected  between 7am-10am.  .   Resulting Agency  [facility]ICH[facility]   DHEA-Sulfate, LCMS ug/dL 91   Comment: This test was developed and its performance characteristics  determined by [facility]. It has not been cleared or approved  by the Food and Drug Administration.  Reference Range:  Adult Females (61 - 70y): <128   Resulting Agency  [facility]   At that point, I referred the patient for a pituitary MRI:  Pituitary MRI (10/02/2021): showed a microadenoma: Brain: Limited by motion degradation which is pervasive. Small pituitary gland with partially empty sella. There is a hypoenhancing nodule in the right aspect of the pituitary/sella measuring up to 3 mm on coronal postcontrast imaging, see series 26. Increased thickness along the right dorsum sella is ossification by 2019 CT. No infundibular or cavernous sinus disease.   IMPRESSION: 1. 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 Significant motion  artifact. 2. Mild chronic small vessel disease.  At our visit from 10/2021, pituitary labs were at goal (with the exception of a slightly low free T3): Component     Latest Ref Rng 10/28/2021  IGF-I, LC/MS     41 - 279 ng/mL 141   Z-Score (Female)     -2.0 - 2.0 SD 0.4   Triiodothyronine,Free,Serum     2.3 - 4.2 pg/mL 2.1 (L)   T4,Free(Direct)     0.60 - 1.60 ng/dL 1.61   Growth Hormone     < OR = 7.1 ng/mL 0.1   Prolactin     ng/mL 11.4   TSH     0.35 - 5.50 uIU/mL 0.91     Due to the possibility of Cushing's disease, I referred her to [facility].  She did not have this appointment yet, as it was rescheduled.  She also has HTN. She has been in the hospital  repeatedly in the past for pancreatitis. She also has a history of MGUS, increased ferritin, osteoporosis.  ROS: + See HPI  I reviewed pt's medications, allergies, PMH, social hx, family hx, and changes were documented in the history of present illness. Otherwise, unchanged from my initial visit note.  Past Medical History:  Diagnosis Date   Acid reflux    Amputated left leg (HCC)    Anemia    Arthritis    Axillary masses    Soft tissue - status post excision   Back pain    Cancer (HCC)    CHF (congestive heart failure) (HCC)    COVID-19 virus infection 04/06/2019   Depression    End-stage renal disease (HCC)    M/W/F dialysis   Essential hypertension    Headache    years ago   History of blood transfusion    History of cardiac catheterization    Normal coronary arteries October 2020   History of claustrophobia    History of pneumonia 2019   Hypoxia 04/03/2019   Memory loss    Mixed hyperlipidemia    Obesity    Pancreatitis    Peritoneal dialysis catheter in place [facility])    Pneumonia due to COVID-19 virus 04/02/2019   Sleep apnea    Noncompliant with CPAP   Stroke (HCC)    mini stroke   Type 2 diabetes mellitus (HCC)    Past Surgical History:  Procedure Laterality Date   ABDOMINAL AORTOGRAM W/LOWER EXTREMITY N/A 04/30/2022   Procedure: ABDOMINAL AORTOGRAM W/LOWER EXTREMITY;  Surgeon: Leonie Douglas, MD;  Location: [facility] INVASIVE CV LAB;  Service: Cardiovascular;  Laterality: N/A;   ABDOMINAL AORTOGRAM W/LOWER EXTREMITY N/A 07/21/2022   Procedure: ABDOMINAL AORTOGRAM W/LOWER EXTREMITY;  Surgeon: Nada Libman, MD;  Location: [facility] INVAS[facility] CV [facility];  Service: Cardiovascular;  Laterality: N/A;   ABDOMINAL HYSTERECTOMY     ACHILLES TENDON LENGTHENING  08/15/2022   Procedure: ACHILLES TENDON LENGTHENING;  Surgeon: [name], DPM;  Location: [facility] OR;  Service: Podiatry;;   AMPUTATION Right 05/29/2022   Procedure: RIGHT BELOW THE KNEE AMPUTATION;  Surgeon: [name],  MD;  Location: [facility] OR;  Service: Orthopedics;  Laterality: Right;   AMPUTATION Left 09/04/2022   Procedure: AMPUTATION FOOT, serial irrigation;  Surgeon: Louann Sjogren, DPM;  Location: [facility] OR;  Service: Podiatry;  Laterality: Left;  Surgical team to do block   AMPUTATION Left 10/07/2022   Procedure: LEFT BELOW KNEE AMPUTATION;  Surgeon: [name], MD;  Location: [facility] OR;  Service: Orthopedics;  Laterality:  Left;   AV FISTULA PLACEMENT Left 09/02/2017   Procedure: creation of left arm ARTERIOVENOUS (AV) FISTULA;  Surgeon: Nada Libman, MD;  Location: [facility] OR;  Service: Vascular;  Laterality: Left;   COLONOSCOPY  2008   Dr. Darrick Penna: normal    COLONOSCOPY N/A 12/18/2016   Dr. Darrick Penna: multiple tubular adenomas, internal hemorrhoids. Surveillance in 3 years    ESOPHAGEAL DILATION N/A 10/13/2015   Procedure: ESOPHAGEAL DILATION;  Surgeon: Malissa Hippo, MD;  Location: [facility];  Service: Endoscopy;  Laterality: N/A;   ESOPHAGOGASTRODUODENOSCOPY N/A 10/13/2015   Dr. Karilyn Cota: chronic gastritis on path, no H.pylori. Empiric dilation    ESOPHAGOGASTRODUODENOSCOPY N/A 12/18/2016   Dr. Darrick Penna: mild gastritis. BRAVO study revealed uncontrolled GERD. Dysphagia secondary to uncontrolled reflux   FOOT SURGERY Bilateral    "nerve"     LEFT HEART CATH AND CORONARY ANGIOGRAPHY N/A 12/29/2018   Procedure: LEFT HEART CATH AND CORONARY ANGIOGRAPHY;  Surgeon: Corky Crafts, MD;  Location: [facility] INVASIVE CV LAB;  Service: Cardiovascular;  Laterality: N/A;   LOWER EXTREMITY ANGIOGRAPHY Right 05/04/2022   Procedure: Lower Extremity Angiography;  Surgeon: Victorino Sparrow, MD;  Location: [facility] INVASIVE CV LAB;  Service: Cardiovascular;  Laterality: Right;   LUNG BIOPSY     MASS EXCISION Right 01/09/2013   Procedure: EXCISION OF NEOPLASM OF RIGHT  AXILLA  AND EXCISION OF NEOPLASM OF LEFT AXILLA;  Surgeon: Dalia Heading, MD;  Location: [facility] ORS;  Service: General;  Laterality: Right;  procedure end @ 08:23    MYRINGOTOMY WITH TUBE PLACEMENT Bilateral 04/28/2017   Procedure: BILATERAL MYRINGOTOMY WITH TUBE PLACEMENT;  Surgeon: Newman Pies, MD;  Location: [facility] OR;  Service: ENT;  Laterality: Bilateral;   PERIPHERAL VASCULAR BALLOON ANGIOPLASTY Right 05/04/2022   Procedure: PERIPHERAL VASCULAR BALLOON ANGIOPLASTY;  Surgeon: Victorino Sparrow, MD;  Location: [facility] INVASIVE CV LAB;  Service: Cardiovascular;  Laterality: Right;  PT   PERIPHERAL VASCULAR INTERVENTION Right 05/04/2022   Procedure: PERIPHERAL VASCULAR INTERVENTION;  Surgeon: Victorino Sparrow, MD;  Location: [facility] INVASIVE CV LAB;  Service: Cardiovascular;  Laterality: Right;  SFA   PERIPHERAL VASCULAR INTERVENTION Left 07/21/2022   Procedure: PERIPHERAL VASCULAR INTERVENTION;  Surgeon: Nada Libman, MD;  Location: [facility];  Service: Cardiovascular;  Laterality: Left;   REVISION OF ARTERIOVENOUS GORETEX GRAFT Left 05/04/2018   Procedure: TRANSPOSITION OF CEPHALIC VEIN ARTERIOVENOUS FISTULA LEFT ARM;  Surgeon: Larina Earthly, MD;  Location: [facility] OR;  Service: Vascular;  Laterality: Left;   SAVORY DILATION N/A 12/18/2016   Procedure: SAVORY DILATION;  Surgeon: West Bali, MD;  Location: [facility];  Service: Endoscopy;  Laterality: N/A;   TRANSMETATARSAL AMPUTATION Left 08/15/2022   Procedure: TRANSMETATARSAL AMPUTATION;  Surgeon: [name], DPM;  Location: [facility] OR;  Service: Podiatry;  Laterality: Left;   Social History   Socioeconomic History   Marital status: Married    Spouse name: Not on file   Number of children: 2  Occupational History   CNA  Tobacco Use   Smoking status: Never Smoker   Smokeless tobacco: Never Used  Substance and Sexual Activity   Alcohol use: No   Drug use: No   Current Outpatient Medications on File Prior to Visit  Medication Sig Dispense Refill   amLODipine (NORVASC) 5 MG tablet Take 1 tablet (5 mg total) by mouth daily. 30 tablet 3   aspirin 81 MG chewable tablet CHEW TWO TABLETS BY MOUTH EVERY DAY  (Patient taking differently: Chew 162  mg by mouth daily.) 56 tablet 11   B Complex-C-Folic Acid (RENA-VITE RX) 1 MG TABS Take 1 tablet by mouth daily.     Blood Glucose Monitoring Suppl (ONETOUCH VERIO) w/Device KIT Use to check blood sugar 4X daily. 1 kit 0   calcitRIOL (ROCALTROL) 0.25 MCG capsule TAKE 1 CAPSULE (0.25 MCG TOTAL) BY MOUTH 2 (TWO) TIMES DAILY WITH A MEAL. 180 capsule 1   cinacalcet (SENSIPAR) 30 MG tablet Take 3 tablets (90 mg total) by mouth 3 (three) times a week. 60 tablet    Continuous Blood Gluc Receiver (FREESTYLE LIBRE 2 READER) DEVI 1 each by Does not apply route daily. 1 each 3   Continuous Blood Gluc Sensor (FREESTYLE LIBRE 2 SENSOR) MISC 1 each by Does not apply route every 14 (fourteen) days. 6 each 3   dextromethorphan-guaiFENesin (MUCINEX DM) 30-600 MG 12hr tablet Take 1 tablet by mouth 2 (two) times daily as needed for cough. 30 tablet 0   DULoxetine (CYMBALTA) 60 MG capsule TAKE (1) CAPSULE BY MOUTH ONCE DAILY. (Patient taking differently: Take 60 mg by mouth daily.) 28 capsule 11   ezetimibe (ZETIA) 10 MG tablet Take 1 tablet (10 mg total) by mouth daily. 28 tablet 11   FIASP FLEXTOUCH 100 UNIT/ML FlexTouch Pen INJECT 18 TO 30 UNITS INTO THE SKIN WITH BREAKFAST, WITH LUNCH, AND WITH EVENING MEAL (Patient taking differently: Inject 18-30 Units into the skin 3 (three) times daily. Sliding scale) 90 mL 3   glucose blood (ONETOUCH VERIO) test strip Use as instructed to check blood sugar 4X daily. 200 each 2   hydrOXYzine (ATARAX) 25 MG tablet Take 25 mg by mouth every 12 (twelve) hours as needed for itching.     insulin aspart (NOVOLOG) 100 UNIT/ML injection Inject 1-10 Units into the skin 3 (three) times daily before meals. Sliding scale 101-150=1 units 151-200=2 units 201-250=4 units 251-300=6 units 301-350=8 units 351-450=10 units Above 450 call MD (Patient not taking: Reported on 11/26/2022)     insulin glargine, 2 Unit Dial, (TOUJEO MAX SOLOSTAR) 300 UNIT/ML  Solostar Pen Inject 27 Units into the skin daily. May need to slowly increase the dose depending upon your blood sugar, follow-up with PCP     Insulin Pen Needle 32G X 4 MM MISC Use 4x a day 300 each 3   metoprolol succinate (TOPROL-XL) 50 MG 24 hr tablet TAKE (1) TABLET BY MOUTH DAILY WITH FOOD *TAKE AFTER DIALYSIS* (Patient taking differently: Take 50 mg by mouth See admin instructions. Take Tues, thurs, sat & sunday) 28 tablet 11   ondansetron (ZOFRAN) 4 MG tablet Take 1 tablet (4 mg total) by mouth every 8 (eight) hours as needed for nausea or vomiting. 20 tablet 0   OneTouch Delica Lancets 33G MISC Use to check blood sugar 4X daily. 100 each 2   oxyCODONE-acetaminophen (PERCOCET/ROXICET) 5-325 MG tablet Take 1 tablet by mouth every 4 (four) hours as needed. 30 tablet 0   pregabalin (LYRICA) 50 MG capsule Take 1 capsule (50 mg total) by mouth daily.     sertraline (ZOLOFT) 50 MG tablet Take 2 tablets (100 mg total) by mouth daily.     [name] 500 MG chewable tablet Chew 500-1,000 mg by mouth See admin instructions. Take 1000mg  (2 tablets) by mouth with meals and 500mg  (1 tablet) with snacks     [DISCONTINUED] FLUoxetine (PROZAC) 10 MG capsule Take 10 mg by mouth daily.       [DISCONTINUED] glipiZIDE (GLUCOTROL) 10 MG tablet Take 10 mg by  mouth 2 (two) times daily before a meal.       Current Facility-Administered Medications on File Prior to Visit  Medication Dose Route Frequency Provider Last Rate Last Admin   0.9 %  sodium chloride infusion   Intravenous Continuous Lockamy, Randi L, NP-C   Stopped at 10/07/22 1422   Allergies  Allergen Reactions   Ace Inhibitors Anaphylaxis and Swelling   Penicillins Itching and Swelling   Statins Other (See Comments)    elevated LFT's     Albuterol Swelling   Family History  Problem Relation Age of Onset   Hypertension Father    Hypercholesterolemia Father    Arthritis Father    Hypertension Sister    Hypercholesterolemia Sister    Breast  cancer Sister    Hypertension Sister    Colon cancer Neg Hx    Colon polyps Neg Hx    PE: BP 132/80 (BP Location: Right Arm, Patient Position: Sitting, Cuff Size: Large)   Pulse 62   Resp 20   Ht 5' (1.524 m)   SpO2 94%   BMI 36.33 kg/m  Wt Readings from Last 3 Encounters:  11/26/22 186 lb (84.4 kg)  11/20/22 180 lb 1.9 oz (81.7 kg)  11/18/22 180 lb 1.9 oz (81.7 kg)   Constitutional: overweight, in NAD, moon facies, truncal obesity Eyes: EOMI, no exophthalmos ENT: no thyromegaly, no cervical lymphadenopathy Cardiovascular: RRR, No RG, +1/6 SEM Respiratory: CTA B Musculoskeletal: + B BKA Skin: + dark macular rash and dry skin on her chest and neck Neurological: no tremor with outstretched hands  ASSESSMENT: 1. DM2, insulin-dependent, uncontrolled, with complications - CKD stage 4 - PN  2. HL  3. B adrenal adenomas  4.  Hypercortisolism  5. Pituitary microadenoma  PLAN:  1.  Very complex patient with longstanding, uncontrolled, type 2 diabetes, previously on premixed insulin regimen but now on basal bolus insulin regimen with improved control.  She had a very high HbA1c of 11.4% 1.5 years ago, but since then, it improved to a nadir of 6.3% last visit.  At that time, sugars were fluctuating within the target range in the first part of the day and they were increasing later in the day, with more variable values after lunch and dinner.  They started to improve overnight.  She was off FiAsp, but I felt that she needed to add a lower dose.  I advised her to decrease her Toujeo and Fiasp doses at that time.  Her insulin resistance significantly improved after her BKA. -At today's visit, reviewing her meter downloads, it appears that sugars are quite fluctuating, better in the morning and increasing throughout the day, up to 400s.  Upon questioning, she is not taking only Toujeo (which her daughter holds in the days of dialysis), and sliding scale Fiasp, after her discharge from the  hospital.  We discussed that she does need to take mealtime insulin and I advised him to start with 10 units and increase as needed.  I also advised him to give her Toujeo at night every day.  We also adjusted her sliding scale slightly. -She had a low blood sugar while in dialysis but it is unclear in which circumstances and how low the sugars got.  I advised her to hold Fiasp before breakfast and the days that patient has dialysis (MWF at 6:30 am) - I suggested to:  Patient Instructions  Please continue: - Toujeo 30 units daily - evening  Add: - Fiasp:  - 10 - 20  units before meals (may skip Fiasp with b'fast before dialysis) Use the following FiAsp Sliding scale: - 150-175: + 1 unit  - 176-200: + 2 units  - 201-225: + 3 units  - 226-250: + 4 units  - 251-275: + 5 units - 276-300: + 6 units   Please return in 3-4 months.      - we checked her HbA1c: 7.7% (higher)  - advised to check sugars at different times of the day - 4x a day, rotating check times - advised for yearly eye exams >> she is not UTD - return to clinic in 3-4 months  2. HL -Reviewed latest lipid panel from 07/2022: LDL improved, now at goal, triglycerides elevated, HDL low: Lab Results  Component Value Date   CHOL 156 07/22/2022   HDL 26 (L) 07/22/2022   LDLCALC 70 07/22/2022   LDLDIRECT 120.0 11/21/2020   TRIG 301 (H) 07/22/2022   CHOLHDL 6.0 07/22/2022  -She could not use statins due to transaminitis.  She is on Zetia 10 mg daily, tolerated well.  The following problems were not addressed at today's visit:  3.  Bilateral adrenal adenoma -Patient with history of bilateral adrenal adenomas per review of her abdominal MRI from 2019.  Imaging characteristics pointed towards benign tumors, of 2.5 and 1.6 cm, respectively.  However, they have appeared to increase over time.  Reviewing the MRI of the abdomen from 2010 and the CT of the abdomen and pelvis from 2017, only 1 nodule was seen in both, in the left  adrenal.  She had another MRI in 03/2018 which showed a stable left 1.9 cm adrenal nodule. -We investigated her adrenal status.  Plasma catecholamines, metanephrines, and aldosterone  were normal between 2019-2022.  Norepinephrine was slightly elevated (nonspecific) 09/2018.  Plasma catecholamines, metanephrines, aldosterone, and renin activity were all normal. -Investigation of her cortisol status is difficult due to being on dialysis.  These patients usually have higher cortisol levels due to increased inflammation but not necessarily increased cortisol production.  However, repeated investigation for this returned positive.  These patients usually have higher cortisol level due to increased inflammation but not necessarily increased cortisol production.  However, repeated investigation for this returned positive.   4.  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  To determine between the 2, she may need to have an inferior petrosal sinus sampling test, which is not available here.  I referred her to [facility].  She did not have this appointment yet.  5.  Pituitary microadenoma -Please see above -After ACTH and cortisol returned elevated, we checked a pituitary MRI (10/02/2021).  This revealed a possible 3 mm pituitary mass. -We checked her pituitary hormones and they were normal with exception of a slightly low free T3 -It is possible that the adenoma is the source of her cortisol production, however, she would need petrosal sinus  sampling before possible surgery.  She is waiting for an appointment at [facility]. -We discussed about the possible risk of hypopituitarism after surgery but also resolution of her hypercortisolism with most likely improvement in blood pressure, diabetes, weight, risk of osteoporosis and  immunosuppression. -If the appointment with [facility] is further delayed, we will need to repeat a pituitary MRI within the next year.  Carlus Pavlov, MD PhD [facility] Endocrin[facility]

## 2022-12-15 NOTE — Addendum Note (Signed)
Addended by: Tera Partridge on: 12/15/2022 01:31 PM   Modules accepted: Orders

## 2022-12-16 ENCOUNTER — Ambulatory Visit: Payer: Self-pay | Admitting: *Deleted

## 2022-12-16 ENCOUNTER — Encounter: Payer: Self-pay | Admitting: Family Medicine

## 2022-12-16 DIAGNOSIS — N186 End stage renal disease: Secondary | ICD-10-CM | POA: Diagnosis not present

## 2022-12-16 DIAGNOSIS — N2581 Secondary hyperparathyroidism of renal origin: Secondary | ICD-10-CM | POA: Diagnosis not present

## 2022-12-16 DIAGNOSIS — Z992 Dependence on renal dialysis: Secondary | ICD-10-CM | POA: Diagnosis not present

## 2022-12-16 LAB — HEPATIC FUNCTION PANEL
ALT: 48 IU/L — ABNORMAL HIGH (ref 0–32)
AST: 19 IU/L (ref 0–40)
Albumin: 3.8 g/dL — ABNORMAL LOW (ref 3.9–4.9)
Alkaline Phosphatase: 113 IU/L (ref 44–121)
Bilirubin Total: 0.3 mg/dL (ref 0.0–1.2)
Bilirubin, Direct: 0.14 mg/dL (ref 0.00–0.40)
Total Protein: 6.2 g/dL (ref 6.0–8.5)

## 2022-12-16 LAB — CBC WITH DIFFERENTIAL/PLATELET
Basophils Absolute: 0 10*3/uL (ref 0.0–0.2)
Basos: 0 %
EOS (ABSOLUTE): 0 10*3/uL (ref 0.0–0.4)
Eos: 0 %
Hematocrit: 37 % (ref 34.0–46.6)
Hemoglobin: 10.9 g/dL — ABNORMAL LOW (ref 11.1–15.9)
Immature Grans (Abs): 0 10*3/uL (ref 0.0–0.1)
Immature Granulocytes: 0 %
Lymphocytes Absolute: 1 10*3/uL (ref 0.7–3.1)
Lymphs: 14 %
MCH: 23.8 pg — ABNORMAL LOW (ref 26.6–33.0)
MCHC: 29.5 g/dL — ABNORMAL LOW (ref 31.5–35.7)
MCV: 81 fL (ref 79–97)
Monocytes Absolute: 0.6 10*3/uL (ref 0.1–0.9)
Monocytes: 8 %
NRBC: 2 % — ABNORMAL HIGH (ref 0–0)
Neutrophils Absolute: 5.6 10*3/uL (ref 1.4–7.0)
Neutrophils: 78 %
Platelets: 354 10*3/uL (ref 150–450)
RBC: 4.58 x10E6/uL (ref 3.77–5.28)
RDW: 18.4 % — ABNORMAL HIGH (ref 11.7–15.4)
WBC: 7.2 10*3/uL (ref 3.4–10.8)

## 2022-12-16 NOTE — Patient Instructions (Signed)
Visit Information  Thank you for taking time to visit with me today. Please don't hesitate to contact me if I can be of assistance to you.   Following are the goals we discussed today:   Goals Addressed             This Visit's Progress    Receive Assistance Applying for Aid & Attendance Benefits, through [employer].   On track    Care Coordination Interventions:  Interventions Today    Flowsheet Row Most Recent Value  Chronic Disease   Chronic disease during today's visit Diabetes, Hypertension (HTN), Chronic Kidney Disease/End Stage Renal Disease (ESRD), Congestive Heart Failure (CHF), Other  [Neuropathy, Chronic Pain Syndrome, Anxiety, Depression, Memory Loss, Neurocognitive Disorder & Requires Maximum Assistance with Activities of Daily Living]  General Interventions   General Interventions Discussed/Reviewed General Interventions Reviewed, Annual Eye Exam, General Interventions Discussed, Labs, Annual Foot Exam, Lipid Profile, Durable Medical Equipment (DME), Vaccines, Health Screening, [employer], Doctor Visits, Communication with, Level of Care  [Primary Care Provider]  Labs Hgb A1c every 3 months, Kidney Function  Vaccines COVID-19, Flu, Pneumonia, RSV, Shingles, Tetanus/Pertussis/Diphtheria  [Encouraged]  Doctor Visits Discussed/Reviewed Doctor Visits Discussed, Doctor Visits Reviewed, Annual Wellness Visits, PCP, Specialist  [Encouraged]  Health Screening Colonoscopy, Mammogram  [Encouraged]  Durable Medical Equipment (DME) BP Cuff, Glucomoter, Environmental consultant, Wheelchair, Physicist, medical  PCP/Specialist Visits Compliance with follow-up visit  Communication with PCP/Specialists, RN  Level of Care Adult Daycare, Air traffic controller, Assisted Living, Skilled Nursing Facility, Teaching laboratory technician Medicaid, Personal Care Services  Exercise Interventions   Exercise Discussed/Reviewed Exercise Discussed, Exercise Reviewed, Physical  Activity, Assistive device use and maintanence  Physical Activity Discussed/Reviewed Physical Activity Discussed, Physical Activity Reviewed, Types of exercise, Home Exercise Program (HEP)  [Encouraged]  Education Interventions   Education Provided Provided Therapist, sports, Provided Web-based Education, Provided Education  Provided Verbal Education On Nutrition, Foot Care, Eye Care, Labs, Blood Sugar Monitoring, Mental Health/Coping with Illness, Applications, Exercise, Medication, When to see the doctor, [employer], Human resources officer, Personal Care Services  Mental Health Interventions   Mental Health Discussed/Reviewed Mental Health Discussed, Mental Health Reviewed, Coping Strategies, Crisis, Anxiety, Depression, Grief and Loss, Substance Abuse, Suicide  Nutrition Interventions   Nutrition Discussed/Reviewed Nutrition Discussed, Nutrition Reviewed, Adding fruits and vegetables, Fluid intake, Decreasing sugar intake, Increaing proteins, Decreasing fats, Decreasing salt  Pharmacy Interventions   Pharmacy Dicussed/Reviewed Pharmacy Topics Discussed, Pharmacy Topics Reviewed, Medication Adherence, Affording Medications, Referral to Pharmacist  Medication Adherence Unable to refill medication  Referral to Pharmacist Cannot afford medications  Safety Interventions   Safety Discussed/Reviewed Safety Discussed, Safety Reviewed, Fall Risk, Home Safety  Home Safety Assistive Devices, Need for home safety assessment, Refer for home visit, Contact provider for referral to PT/OT, Refer for community resources, Contact home health agency  Advanced Directive Interventions   Advanced Directives Discussed/Reviewed Advanced Directives Discussed, Advanced Directives Reviewed, Advanced Care Planning, Provided resource for acquiring and filling out documents      Active Listening & Reflection Utilized.  Emotional Support Provided. Verbalization of Feelings Encouraged. Feelings  of Strength & Motivation Validated. Symptoms of Fatigue Acknowledged.  Solution-Focused Strategies Employed. Problem Solving Interventions Revised. Task-Centered Solutions Revisited.  Cognitive Behavioral Therapy Performed.  Client-Centered Therapy Initiated. Acceptance & Commitment Therapy Conducted. Encouraged Routine Engagement in Activities of Interest, Inside & Outside the Home. Encouraged Increased Level of Activity & Exercise, as Tolerated. Encouraged Administration of Medications, Exactly as Prescribed. Encouraged Daily Implementation of Deep  Breathing Exercises, Relaxation Techniques, & Mindfulness Meditation Strategies. CSW Collaboration with Daughter, [name] to Encourage Patient's Self-Enrollment with Psychiatrist of Interest in [facility], from List Provided, to Receive Psychotropic Medication Administration & Management, in An Effort to Reduce & Manage Symptoms of Anxiety & Depression. CSW Collaboration with Daughter, [name] to Encourage Patient's Self-Enrollment with Therapist of Interest in [facility], from List Provided, to Receive Psychotherapeutic Counseling & Supportive Services, in An Effort to Reduce & Manage Symptoms of Anxiety & Depression. CSW Collaboration with Daughter, [name] to Confirm Patient's Continued Engagement with Home Health Skilled Nursing, Physical & Occupational Therapy, & [employer], through [facility] [phone]). CSW Collaboration with Daughter, [name] to Encourage Patient's Continued Attendance at [facility] [phone]), to Receive Hemodialysis 3 Days Per Week.  CSW Collaboration with Daughter, [name] to Encourage Initiation of New Aide & Attendance Benefit Application, Offering Assistance with Completion & Submission to [facility] [phone]), for Processing.   CSW Collaboration with Daughter, [name] to Encourage  Attendance at Appointment for Patient with [facility] (# [phone]) to Undergo MRI, Scheduled on 12/22/2022 at 7:40 AM. CSW Collaboration with Daughter, [name] to Encourage Attendance at Follow-Up Appointment for Patient with Dr. Aldean Baker, Orthopedic Surgeon with [facility] [phone]), Scheduled on 12/31/2022 at 9:45 AM. CSW Collaboration with Daughter, [name] to Encourage Attendance at Follow-Up Appointment for Patient with Dr. Syliva Overman, Primary Care Provider with [facility] [phone]), Scheduled on 01/14/2023 at 10:40 AM. CSW Collaboration with Daughter, [name] to [employer] with CSW [phone]), if She Has Questions, Needs Assistance, or If Additional Social Work Needs Are Identified Between Now & Our Next Follow-Up Outreach Call, Scheduled on 01/18/2023 at 2:00 PM.      Our next appointment is by telephone on 01/18/2023 at 2:00 pm.  Please call the care guide team at [phone] if you need to cancel or reschedule your appointment.   If you are experiencing a Mental Health or Behavioral Health Crisis or need someone to talk to, please call the Suicide and Crisis Lifeline: 988 call the [place] National Suicide Prevention Lifeline: 269 269 4561 or TTY: 670 475 7340 TTY 859 736 3220) to talk to a trained counselor call 1-800-273-TALK (toll free, 24 hour hotline) go to [facility] [address] [place] [phone]) call the [facility] Crisis Line: 5180068421 call 911  Patient verbalizes understanding of instructions and care plan provided today and agrees to view in MyChart. Active MyChart status and patient understanding of how to access instructions and care plan via MyChart confirmed with patient.     Telephone follow up appointment with care management team member scheduled for:  01/18/2023 at 2:00 pm.  Danford Bad,  BSW, MSW, LCSW  Embedded Practice Social Work Case Manager  [facility], Population Health Direct Dial: [phone]  Fax: [phone] Email: [email or web address] Website: [email or web address]

## 2022-12-16 NOTE — Patient Outreach (Signed)
Care Coordination   Follow Up Visit Note   12/16/2022  Name: Kathryn Beck MRN: [id number] DOB: [birth year]  Kathryn Beck is a 67 y.o. year old female who sees Kathryn Perches, MD for primary care. I spoke with patient's daughter, Kathryn Beck by phone today.  What matters to the patients health and wellness today?  Receive Assistance Applying for Aid & Attendance Benefits, through Kathryn Beck.    Goals Addressed             This Visit's Progress    Receive Assistance Applying for Aid & Attendance Benefits, through Kathryn Beck.   On track    Care Coordination Interventions:  Interventions Today    Flowsheet Row Most Recent Value  Chronic Disease   Chronic disease during today's visit Diabetes, Hypertension (HTN), Chronic Kidney Disease/End Stage Renal Disease (ESRD), Congestive Heart Failure (CHF), Other  [Neuropathy, Chronic Pain Syndrome, Anxiety, Depression, Memory Loss, Neurocognitive Disorder & Requires Maximum Assistance with Activities of Daily Living]  General Interventions   General Interventions Discussed/Reviewed General Interventions Reviewed, Annual Eye Exam, General Interventions Discussed, Labs, Annual Foot Exam, Lipid Profile, Durable Medical Equipment (DME), Vaccines, Health Screening, [employer], Doctor Visits, Communication with, Level of Care  [Primary Care Provider]  Labs Hgb A1c every 3 months, Kidney Function  Vaccines COVID-19, Flu, Pneumonia, RSV, Shingles, Tetanus/Pertussis/Diphtheria  [Encouraged]  Doctor Visits Discussed/Reviewed Doctor Visits Discussed, Doctor Visits Reviewed, Annual Wellness Visits, PCP, Specialist  [Encouraged]  Health Screening Colonoscopy, Mammogram  [Encouraged]  Durable Medical Equipment (DME) BP Cuff, Glucomoter, Environmental consultant, Wheelchair, Physicist, medical  PCP/Specialist Visits Compliance with follow-up visit  Communication with PCP/Specialists, RN  Level of Care Adult  Daycare, Air traffic controller, Assisted Living, Skilled Nursing Facility, Teaching laboratory technician Medicaid, Personal Care Services  Exercise Interventions   Exercise Discussed/Reviewed Exercise Discussed, Exercise Reviewed, Physical Activity, Assistive device use and maintanence  Physical Activity Discussed/Reviewed Physical Activity Discussed, Physical Activity Reviewed, Types of exercise, Home Exercise Program (HEP)  [Encouraged]  Education Interventions   Education Provided Provided Therapist, sports, Provided Web-based Education, Provided Education  Provided Verbal Education On Nutrition, Foot Care, Eye Care, Labs, Blood Sugar Monitoring, Mental Health/Coping with Illness, Applications, Exercise, Medication, When to see the doctor, [employer], Human resources officer, Personal Care Services  Mental Health Interventions   Mental Health Discussed/Reviewed Mental Health Discussed, Mental Health Reviewed, Coping Strategies, Crisis, Anxiety, Depression, Grief and Loss, Substance Abuse, Suicide  Nutrition Interventions   Nutrition Discussed/Reviewed Nutrition Discussed, Nutrition Reviewed, Adding fruits and vegetables, Fluid intake, Decreasing sugar intake, Increaing proteins, Decreasing fats, Decreasing salt  Pharmacy Interventions   Pharmacy Dicussed/Reviewed Pharmacy Topics Discussed, Pharmacy Topics Reviewed, Medication Adherence, Affording Medications, Referral to Pharmacist  Medication Adherence Unable to refill medication  Referral to Pharmacist Cannot afford medications  Safety Interventions   Safety Discussed/Reviewed Safety Discussed, Safety Reviewed, Fall Risk, Home Safety  Home Safety Assistive Devices, Need for home safety assessment, Refer for home visit, Contact provider for referral to PT/OT, Refer for community resources, Contact home health agency  Advanced Directive Interventions   Advanced Directives Discussed/Reviewed Advanced Directives Discussed,  Advanced Directives Reviewed, Advanced Care Planning, Provided resource for acquiring and filling out documents      Active Listening & Reflection Utilized.  Emotional Support Provided. Verbalization of Feelings Encouraged. Feelings of Strength & Motivation Validated. Symptoms of Fatigue Acknowledged.  Solution-Focused Strategies Employed. Problem Solving Interventions Revised. Task-Centered Solutions Revisited.  Cognitive Behavioral Therapy Performed.  Client-Centered Therapy Initiated. Acceptance &  Commitment Therapy Conducted. Encouraged Routine Engagement in Activities of Interest, Inside & Outside the Home. Encouraged Increased Level of Activity & Exercise, as Tolerated. Encouraged Administration of Medications, Exactly as Prescribed. Encouraged Daily Implementation of Deep Breathing Exercises, Relaxation Techniques, & Mindfulness Meditation Strategies. CSW Collaboration with Daughter, Kathryn Beck to Encourage Patient's Self-Enrollment with Psychiatrist of Interest in [facility], from List Provided, to Receive Psychotropic Medication Administration & Management, in An Effort to Reduce & Manage Symptoms of Anxiety & Depression. CSW Collaboration with Daughter, Kathryn Beck to Encourage Patient's Self-Enrollment with Therapist of Interest in [facility], from List Provided, to Receive Psychotherapeutic Counseling & Supportive Services, in An Effort to Reduce & Manage Symptoms of Anxiety & Depression. CSW Collaboration with Daughter, Kathryn Beck to Confirm Patient's Continued Engagement with Home Health Skilled Nursing, Physical & Occupational Therapy, & [employer], through [facility] [phone]). CSW Collaboration with Daughter, Kathryn Beck to Encourage Patient's Continued Attendance at [facility] [phone]), to Receive Hemodialysis 3 Days Per Week.  CSW Collaboration with Daughter, Kathryn Beck to Encourage  Initiation of New Aide & Attendance Benefit Application, Offering Assistance with Completion & Submission to [facility] [phone]), for Processing.   CSW Collaboration with Daughter, Kathryn Beck to Encourage Attendance at Appointment for Patient with [facility] (# [phone]) to Undergo MRI, Scheduled on 12/22/2022 at 7:40 AM. CSW Collaboration with Daughter, Kathryn Beck to Encourage Attendance at Follow-Up Appointment for Patient with Dr. Aldean Baker, Orthopedic Surgeon with [facility] [phone]), Scheduled on 12/31/2022 at 9:45 AM. CSW Collaboration with Daughter, Kathryn Beck to Encourage Attendance at Follow-Up Appointment for Patient with Dr. Syliva Overman, Primary Care Provider with [facility] [phone]), Scheduled on 01/14/2023 at 10:40 AM. CSW Collaboration with Daughter, Kathryn Beck to [employer] with CSW [phone]), if She Has Questions, Needs Assistance, or If Additional Social Work Needs Are Identified Between Now & Our Next Follow-Up Outreach Call, Scheduled on 01/18/2023 at 2:00 PM.      SDOH assessments and interventions completed:  Yes.  Care Coordination Interventions:  Yes, provided.   Follow up plan: Follow up call scheduled for 01/18/2023 at 2:00 pm.  Encounter Outcome:  Patient Visit Completed.   Danford Bad, BSW, MSW, Printmaker Social Work Case Set designer Health  [facility], Population Health Direct Dial: [phone]  Fax: [phone] Email: [email or web address]: [email or web address]

## 2022-12-17 ENCOUNTER — Other Ambulatory Visit: Payer: Self-pay

## 2022-12-17 ENCOUNTER — Encounter: Payer: Self-pay | Admitting: Neurology

## 2022-12-17 ENCOUNTER — Observation Stay (HOSPITAL_COMMUNITY)
Admission: EM | Admit: 2022-12-17 | Discharge: 2022-12-20 | Disposition: A | Discharge status: Home Health | Payer: Medicare PPO | Attending: Family Medicine | Admitting: Family Medicine

## 2022-12-17 ENCOUNTER — Inpatient Hospital Stay (HOSPITAL_COMMUNITY): Payer: Medicare PPO

## 2022-12-17 ENCOUNTER — Telehealth: Payer: Self-pay | Admitting: Family Medicine

## 2022-12-17 ENCOUNTER — Encounter (HOSPITAL_COMMUNITY): Payer: Self-pay | Admitting: Hematology

## 2022-12-17 ENCOUNTER — Emergency Department (HOSPITAL_COMMUNITY): Payer: Medicare PPO

## 2022-12-17 ENCOUNTER — Encounter (HOSPITAL_COMMUNITY): Payer: Self-pay | Admitting: *Deleted

## 2022-12-17 DIAGNOSIS — D631 Anemia in chronic kidney disease: Secondary | ICD-10-CM | POA: Diagnosis not present

## 2022-12-17 DIAGNOSIS — Z79899 Other long term (current) drug therapy: Secondary | ICD-10-CM | POA: Insufficient documentation

## 2022-12-17 DIAGNOSIS — I5032 Chronic diastolic (congestive) heart failure: Secondary | ICD-10-CM | POA: Diagnosis not present

## 2022-12-17 DIAGNOSIS — Z859 Personal history of malignant neoplasm, unspecified: Secondary | ICD-10-CM | POA: Insufficient documentation

## 2022-12-17 DIAGNOSIS — I10 Essential (primary) hypertension: Secondary | ICD-10-CM | POA: Diagnosis not present

## 2022-12-17 DIAGNOSIS — W01198A Fall on same level from slipping, tripping and stumbling with subsequent striking against other object, initial encounter: Secondary | ICD-10-CM | POA: Insufficient documentation

## 2022-12-17 DIAGNOSIS — R41841 Cognitive communication deficit: Secondary | ICD-10-CM | POA: Diagnosis not present

## 2022-12-17 DIAGNOSIS — T8781 Dehiscence of amputation stump: Secondary | ICD-10-CM | POA: Diagnosis present

## 2022-12-17 DIAGNOSIS — E1121 Type 2 diabetes mellitus with diabetic nephropathy: Secondary | ICD-10-CM | POA: Insufficient documentation

## 2022-12-17 DIAGNOSIS — R519 Headache, unspecified: Secondary | ICD-10-CM | POA: Diagnosis not present

## 2022-12-17 DIAGNOSIS — R4182 Altered mental status, unspecified: Secondary | ICD-10-CM | POA: Diagnosis not present

## 2022-12-17 DIAGNOSIS — S069X9A Unspecified intracranial injury with loss of consciousness of unspecified duration, initial encounter: Secondary | ICD-10-CM | POA: Diagnosis not present

## 2022-12-17 DIAGNOSIS — J9811 Atelectasis: Secondary | ICD-10-CM | POA: Diagnosis not present

## 2022-12-17 DIAGNOSIS — E876 Hypokalemia: Secondary | ICD-10-CM | POA: Insufficient documentation

## 2022-12-17 DIAGNOSIS — I6523 Occlusion and stenosis of bilateral carotid arteries: Secondary | ICD-10-CM | POA: Diagnosis not present

## 2022-12-17 DIAGNOSIS — I739 Peripheral vascular disease, unspecified: Secondary | ICD-10-CM | POA: Diagnosis present

## 2022-12-17 DIAGNOSIS — G9389 Other specified disorders of brain: Secondary | ICD-10-CM | POA: Diagnosis not present

## 2022-12-17 DIAGNOSIS — E785 Hyperlipidemia, unspecified: Secondary | ICD-10-CM | POA: Diagnosis present

## 2022-12-17 DIAGNOSIS — I6529 Occlusion and stenosis of unspecified carotid artery: Secondary | ICD-10-CM | POA: Diagnosis not present

## 2022-12-17 DIAGNOSIS — E1122 Type 2 diabetes mellitus with diabetic chronic kidney disease: Secondary | ICD-10-CM | POA: Insufficient documentation

## 2022-12-17 DIAGNOSIS — D638 Anemia in other chronic diseases classified elsewhere: Secondary | ICD-10-CM | POA: Diagnosis present

## 2022-12-17 DIAGNOSIS — Z8673 Personal history of transient ischemic attack (TIA), and cerebral infarction without residual deficits: Secondary | ICD-10-CM | POA: Insufficient documentation

## 2022-12-17 DIAGNOSIS — Z23 Encounter for immunization: Secondary | ICD-10-CM | POA: Diagnosis not present

## 2022-12-17 DIAGNOSIS — Z7982 Long term (current) use of aspirin: Secondary | ICD-10-CM | POA: Insufficient documentation

## 2022-12-17 DIAGNOSIS — N186 End stage renal disease: Secondary | ICD-10-CM | POA: Diagnosis not present

## 2022-12-17 DIAGNOSIS — Z89511 Acquired absence of right leg below knee: Secondary | ICD-10-CM

## 2022-12-17 DIAGNOSIS — I6782 Cerebral ischemia: Secondary | ICD-10-CM | POA: Diagnosis not present

## 2022-12-17 DIAGNOSIS — Z992 Dependence on renal dialysis: Secondary | ICD-10-CM | POA: Diagnosis not present

## 2022-12-17 DIAGNOSIS — I132 Hypertensive heart and chronic kidney disease with heart failure and with stage 5 chronic kidney disease, or end stage renal disease: Secondary | ICD-10-CM | POA: Insufficient documentation

## 2022-12-17 DIAGNOSIS — S0591XA Unspecified injury of right eye and orbit, initial encounter: Secondary | ICD-10-CM | POA: Diagnosis not present

## 2022-12-17 DIAGNOSIS — Z8616 Personal history of COVID-19: Secondary | ICD-10-CM | POA: Insufficient documentation

## 2022-12-17 DIAGNOSIS — M62521 Muscle wasting and atrophy, not elsewhere classified, right upper arm: Secondary | ICD-10-CM | POA: Diagnosis not present

## 2022-12-17 DIAGNOSIS — Z4781 Encounter for orthopedic aftercare following surgical amputation: Secondary | ICD-10-CM | POA: Diagnosis not present

## 2022-12-17 DIAGNOSIS — I639 Cerebral infarction, unspecified: Principal | ICD-10-CM | POA: Diagnosis present

## 2022-12-17 DIAGNOSIS — S199XXA Unspecified injury of neck, initial encounter: Secondary | ICD-10-CM | POA: Diagnosis not present

## 2022-12-17 DIAGNOSIS — I771 Stricture of artery: Secondary | ICD-10-CM | POA: Diagnosis not present

## 2022-12-17 DIAGNOSIS — M62522 Muscle wasting and atrophy, not elsewhere classified, left upper arm: Secondary | ICD-10-CM | POA: Diagnosis not present

## 2022-12-17 DIAGNOSIS — S0990XA Unspecified injury of head, initial encounter: Secondary | ICD-10-CM | POA: Diagnosis not present

## 2022-12-17 DIAGNOSIS — R918 Other nonspecific abnormal finding of lung field: Secondary | ICD-10-CM | POA: Diagnosis not present

## 2022-12-17 DIAGNOSIS — W19XXXA Unspecified fall, initial encounter: Secondary | ICD-10-CM | POA: Diagnosis not present

## 2022-12-17 DIAGNOSIS — E1151 Type 2 diabetes mellitus with diabetic peripheral angiopathy without gangrene: Secondary | ICD-10-CM | POA: Diagnosis not present

## 2022-12-17 DIAGNOSIS — E119 Type 2 diabetes mellitus without complications: Secondary | ICD-10-CM

## 2022-12-17 DIAGNOSIS — Y92009 Unspecified place in unspecified non-institutional (private) residence as the place of occurrence of the external cause: Secondary | ICD-10-CM

## 2022-12-17 DIAGNOSIS — D649 Anemia, unspecified: Secondary | ICD-10-CM | POA: Diagnosis present

## 2022-12-17 HISTORY — DX: Cerebral infarction, unspecified: I63.9

## 2022-12-17 LAB — COMPREHENSIVE METABOLIC PANEL
ALT: 45 U/L — ABNORMAL HIGH (ref 0–44)
AST: 17 U/L (ref 15–41)
Albumin: 3.2 g/dL — ABNORMAL LOW (ref 3.5–5.0)
Alkaline Phosphatase: 96 U/L (ref 38–126)
Anion gap: 12 (ref 5–15)
BUN: 34 mg/dL — ABNORMAL HIGH (ref 8–23)
CO2: 25 mmol/L (ref 22–32)
Calcium: 8.7 mg/dL — ABNORMAL LOW (ref 8.9–10.3)
Chloride: 101 mmol/L (ref 98–111)
Creatinine, Ser: 4.8 mg/dL — ABNORMAL HIGH (ref 0.44–1.00)
GFR, Estimated: 9 mL/min — ABNORMAL LOW (ref >60)
Glucose, Bld: 140 mg/dL — ABNORMAL HIGH (ref 70–99)
Potassium: 3.3 mmol/L — ABNORMAL LOW (ref 3.5–5.1)
Sodium: 138 mmol/L (ref 135–145)
Total Bilirubin: 0.7 mg/dL (ref 0.3–1.2)
Total Protein: 6 g/dL — ABNORMAL LOW (ref 6.5–8.1)

## 2022-12-17 LAB — CBC WITH DIFFERENTIAL/PLATELET
Abs Immature Granulocytes: 0.05 10*3/uL (ref 0.00–0.07)
Basophils Absolute: 0 10*3/uL (ref 0.0–0.1)
Basophils Relative: 0 %
Eosinophils Absolute: 0 10*3/uL (ref 0.0–0.5)
Eosinophils Relative: 0 %
HCT: 36.5 % (ref 36.0–46.0)
Hemoglobin: 11.1 g/dL — ABNORMAL LOW (ref 12.0–15.0)
Immature Granulocytes: 1 %
Lymphocytes Relative: 11 %
Lymphs Abs: 0.8 10*3/uL (ref 0.7–4.0)
MCH: 24.2 pg — ABNORMAL LOW (ref 26.0–34.0)
MCHC: 30.4 g/dL (ref 30.0–36.0)
MCV: 79.5 fL — ABNORMAL LOW (ref 80.0–100.0)
Monocytes Absolute: 0.6 10*3/uL (ref 0.1–1.0)
Monocytes Relative: 8 %
Neutro Abs: 6 10*3/uL (ref 1.7–7.7)
Neutrophils Relative %: 80 %
Platelets: 309 10*3/uL (ref 150–400)
RBC: 4.59 MIL/uL (ref 3.87–5.11)
RDW: 19.9 % — ABNORMAL HIGH (ref 11.5–15.5)
WBC: 7.5 10*3/uL (ref 4.0–10.5)
nRBC: 1.1 % — ABNORMAL HIGH (ref 0.0–0.2)

## 2022-12-17 LAB — GLUCOSE, CAPILLARY: Glucose-Capillary: 171 mg/dL — ABNORMAL HIGH (ref 70–99)

## 2022-12-17 MED ORDER — ACETAMINOPHEN 160 MG/5ML PO SOLN: 650.0000 mg | ORAL | Status: DC | PRN

## 2022-12-17 MED ORDER — FLUORESCEIN SODIUM 1 MG OP STRP
1.0000 | ORAL_STRIP | Freq: Once | OPHTHALMIC | Status: AC
  Administered 2022-12-17: 1 via OPHTHALMIC
  Filled 2022-12-17: qty 1

## 2022-12-17 MED ORDER — ASPIRIN 325 MG PO TABS: 325.0000 mg | ORAL_TABLET | Freq: Every day | ORAL | Status: DC

## 2022-12-17 MED ORDER — TETRACAINE HCL 0.5 % OP SOLN
1.0000 [drp] | Freq: Once | OPHTHALMIC | Status: AC
  Administered 2022-12-17: 1 [drp] via OPHTHALMIC
  Filled 2022-12-17: qty 4

## 2022-12-17 MED ORDER — ASPIRIN 81 MG PO CHEW
324.0000 mg | CHEWABLE_TABLET | Freq: Once | ORAL | Status: AC
  Administered 2022-12-17: 324 mg via ORAL
  Filled 2022-12-17: qty 4

## 2022-12-17 MED ORDER — SUCROFERRIC OXYHYDROXIDE 500 MG PO CHEW
500.0000 mg | CHEWABLE_TABLET | ORAL | Status: DC
  Administered 2022-12-18 – 2022-12-19 (×4): 500 mg via ORAL
  Filled 2022-12-17 (×11): qty 1

## 2022-12-17 MED ORDER — SUCROFERRIC OXYHYDROXIDE 500 MG PO CHEW
1000.0000 mg | CHEWABLE_TABLET | Freq: Two times a day (BID) | ORAL | Status: DC
  Administered 2022-12-18 – 2022-12-20 (×4): 1000 mg via ORAL
  Filled 2022-12-17 (×7): qty 2

## 2022-12-17 MED ORDER — SUCROFERRIC OXYHYDROXIDE 500 MG PO CHEW: 500.0000 mg | CHEWABLE_TABLET | ORAL | Status: DC

## 2022-12-17 MED ORDER — ASPIRIN 81 MG PO CHEW
162.0000 mg | CHEWABLE_TABLET | Freq: Every day | ORAL | Status: DC
  Administered 2022-12-18 – 2022-12-20 (×3): 162 mg via ORAL
  Filled 2022-12-17 (×3): qty 2

## 2022-12-17 MED ORDER — CINACALCET HCL 30 MG PO TABS
90.0000 mg | ORAL_TABLET | ORAL | Status: DC
  Administered 2022-12-18: 90 mg via ORAL
  Filled 2022-12-17 (×2): qty 3

## 2022-12-17 MED ORDER — INSULIN GLARGINE-YFGN 100 UNIT/ML ~~LOC~~ SOLN
18.0000 [IU] | Freq: Every day | SUBCUTANEOUS | Status: DC
  Filled 2022-12-17: qty 0.18

## 2022-12-17 MED ORDER — TORSEMIDE 20 MG PO TABS
100.0000 mg | ORAL_TABLET | Freq: Two times a day (BID) | ORAL | Status: DC
  Administered 2022-12-18 – 2022-12-20 (×4): 100 mg via ORAL
  Filled 2022-12-17 (×4): qty 5

## 2022-12-17 MED ORDER — STROKE: EARLY STAGES OF RECOVERY BOOK
Freq: Once | Status: AC
  Filled 2022-12-17: qty 1

## 2022-12-17 MED ORDER — EZETIMIBE 10 MG PO TABS
10.0000 mg | ORAL_TABLET | Freq: Every day | ORAL | Status: DC
  Administered 2022-12-18 – 2022-12-20 (×3): 10 mg via ORAL
  Filled 2022-12-17 (×3): qty 1

## 2022-12-17 MED ORDER — DULOXETINE HCL 60 MG PO CPEP
60.0000 mg | ORAL_CAPSULE | Freq: Every day | ORAL | Status: DC
  Administered 2022-12-18 – 2022-12-20 (×3): 60 mg via ORAL
  Filled 2022-12-17 (×3): qty 1

## 2022-12-17 MED ORDER — ACETAMINOPHEN 650 MG RE SUPP: 650.0000 mg | RECTAL | Status: DC | PRN

## 2022-12-17 MED ORDER — SENNOSIDES-DOCUSATE SODIUM 8.6-50 MG PO TABS: 1.0000 | ORAL_TABLET | Freq: Every evening | ORAL | Status: DC | PRN

## 2022-12-17 MED ORDER — HEPARIN SODIUM (PORCINE) 5000 UNIT/ML IJ SOLN
5000.0000 [IU] | Freq: Three times a day (TID) | INTRAMUSCULAR | Status: DC
  Administered 2022-12-17 – 2022-12-20 (×8): 5000 [IU] via SUBCUTANEOUS
  Filled 2022-12-17 (×8): qty 1

## 2022-12-17 MED ORDER — INFLUENZA VAC A&B SURF ANT ADJ 0.5 ML IM SUSY
0.5000 mL | PREFILLED_SYRINGE | INTRAMUSCULAR | Status: AC
  Administered 2022-12-19: 0.5 mL via INTRAMUSCULAR
  Filled 2022-12-17: qty 0.5

## 2022-12-17 MED ORDER — TETANUS-DIPHTH-ACELL PERTUSSIS 5-2.5-18.5 LF-MCG/0.5 IM SUSY
0.5000 mL | PREFILLED_SYRINGE | Freq: Once | INTRAMUSCULAR | Status: AC
  Administered 2022-12-17: 0.5 mL via INTRAMUSCULAR
  Filled 2022-12-17: qty 0.5

## 2022-12-17 MED ORDER — ACETAMINOPHEN 325 MG PO TABS: 650.0000 mg | ORAL_TABLET | ORAL | Status: DC | PRN

## 2022-12-17 NOTE — ED Notes (Signed)
Pt stated she still makes a very little urine Informed we need a urine sample Pt stated she went to dialysis yesterday and had her full treatment

## 2022-12-17 NOTE — Progress Notes (Signed)
Patient failed swallow screen. MD Elgergawy notified. Per MD patient to remain NPO, hold insulin and SLP eval consult.

## 2022-12-17 NOTE — ED Triage Notes (Signed)
Pt BIB [facility] from home for a fall that occurred yesterday; pt has pain and swelling to left forearm and abrasion and redness to left eye; pt denies any loc  Pt was getting on a stool when she lost her balance and fell

## 2022-12-17 NOTE — Progress Notes (Signed)
Patient back on the unit. Bed alarm on, call light within reach. Husband at bedside.

## 2022-12-17 NOTE — Progress Notes (Signed)
Patient taken off the floor to MRI.

## 2022-12-17 NOTE — ED Notes (Signed)
Pt complains of HA Pt able to take PO meds without difficultly Drinking water with family at bedside

## 2022-12-17 NOTE — H&P (Addendum)
TRH H&P   Patient Demographics:    Sherissa Gravely, is a 67 y.o. female  MRN: [id number]   DOB - 07-26-1955  Admit Date - 12/17/2022  Outpatient Primary MD for the patient is [name], MD  Referring MD/NP/PA: PA Celeste  Patient coming from: home  Chief Complaint  Patient presents with   Fall      HPI:    Oprah Handke  is a 67 y.o. female,  with medical history significant of acid reflux, depression, ESRD on HD, hyperlipidemia, hypertension, diabetes mellitus, memory loss, with severe PVD, status post bilateral AKA, on aspirin and Plavix, Plavix has been stopped last month per husband at bedside . -Presents status post fall, injury to the right eye, husband at bedside assists with the history, she is wheelchair dependent, she fell out of her chair yesterday when she was going from her wheelchair to a chair, she slipped, she fell, where right side of her face hit the ground, denies any other injuries, no loss of consciousness, she denies any dizziness, lightheadedness, husband at bedside patient has been more confused over the last few days, no fever, no chills, she received her full session of dialysis yesterday, with no complications. -NAD patient received Tdap shots for her right eye injury, CT head, cervical spine, maxillofacial without contrast was significant for subacute left parietal cortical infarct within the posterior left MCA territory, So [facility] hospitalist consulted to admit   Review of systems:     A full 10 point Review of Systems was done, except as stated above, all other Review of Systems were negative.   With Past History of the following :    Past Medical History:  Diagnosis Date   Acid reflux    Amputated left leg (HCC)    Anemia    Arthritis    Axillary masses    Soft tissue - status post excision   Back pain    Cancer (HCC)    CHF  (congestive heart failure) (HCC)    COVID-19 virus infection 04/06/2019   Depression    End-stage renal disease (HCC)    M/W/F dialysis   Essential hypertension    Headache    years ago   History of blood transfusion    History of cardiac catheterization    Normal coronary arteries October 2020   History of claustrophobia    History of pneumonia 2019   Hypoxia 04/03/2019   Memory loss    Mixed hyperlipidemia    Obesity    Pancreatitis    Peritoneal dialysis catheter in place [facility])    Pneumonia due to COVID-19 virus 04/02/2019   Sleep apnea    Noncompliant with CPAP   Stroke (HCC)    mini stroke   Type 2 diabetes mellitus (HCC)       Past Surgical History:  Procedure Laterality Date   ABDOMINAL AORTOGRAM W/LOWER EXTREMITY  N/A 04/30/2022   Procedure: ABDOMINAL AORTOGRAM W/LOWER EXTREMITY;  Surgeon: Leonie Douglas, MD;  Location: [facility] INVASIVE CV LAB;  Service: Cardiovascular;  Laterality: N/A;   ABDOMINAL AORTOGRAM W/LOWER EXTREMITY N/A 07/21/2022   Procedure: ABDOMINAL AORTOGRAM W/LOWER EXTREMITY;  Surgeon: Nada Libman, MD;  Location: [facility] INVASIVE CV LAB;  Service: Cardiovascular;  Laterality: N/A;   ABDOMINAL HYSTERECTOMY     ACHILLES TENDON LENGTHENING  08/15/2022   Procedure: ACHILLES TENDON LENGTHENING;  Surgeon: [name], DPM;  Location: [facility] OR;  Service: Podiatry;;   AMPUTATION Right 05/29/2022   Procedure: RIGHT BELOW THE KNEE AMPUTATION;  Surgeon: [name], MD;  Location: [facility] OR;  Service: Orthopedics;  Laterality: Right;   AMPUTATION Left 09/04/2022   Procedure: AMPUTATION FOOT, serial irrigation;  Surgeon: Louann Sjogren, DPM;  Location: [facility] OR;  Service: Podiatry;  Laterality: Left;  Surgical team to do block   AMPUTATION Left 10/07/2022   Procedure: LEFT BELOW KNEE AMPUTATION;  Surgeon: [name], MD;  Location: [facility] OR;  Service: Orthopedics;  Laterality: Left;   AV FISTULA PLACEMENT Left 09/02/2017   Procedure: creation of left arm ARTERIOVENOUS (AV)  FISTULA;  Surgeon: Nada Libman, MD;  Location: [facility] OR;  Service: Vascular;  Laterality: Left;   COLONOSCOPY  2008   Dr. Darrick Penna: normal    COLONOSCOPY N/A 12/18/2016   Dr. Darrick Penna: multiple tubular adenomas, internal hemorrhoids. Surveillance in 3 years    ESOPHAGEAL DILATION N/A 10/13/2015   Procedure: ESOPHAGEAL DILATION;  Surgeon: Malissa Hippo, MD;  Location: [facility] ENDO SUITE;  Service: Endoscopy;  Laterality: N/A;   ESOPHAGOGASTRODUODENOSCOPY N/A 10/13/2015   Dr. Karilyn Cota: chronic gastritis on path, no H.pylori. Empiric dilation    ESOPHAGOGASTRODUODENOSCOPY N/A 12/18/2016   Dr. Darrick Penna: mild gastritis. BRAVO study revealed uncontrolled GERD. Dysphagia secondary to uncontrolled reflux   FOOT SURGERY Bilateral    "nerve"     LEFT HEART CATH AND CORONARY ANGIOGRAPHY N/A 12/29/2018   Procedure: LEFT HEART CATH AND CORONARY ANGIOGRAPHY;  Surgeon: Corky Crafts, MD;  Location: [facility] INVASIVE CV LAB;  Service: Cardiovascular;  Laterality: N/A;   LOWER EXTREMITY ANGIOGRAPHY Right 05/04/2022   Procedure: Lower Extremity Angiography;  Surgeon: Victorino Sparrow, MD;  Location: [facility] INVASIVE CV LAB;  Service: Cardiovascular;  Laterality: Right;   LUNG BIOPSY     MASS EXCISION Right 01/09/2013   Procedure: EXCISION OF NEOPLASM OF RIGHT  AXILLA  AND EXCISION OF NEOPLASM OF LEFT AXILLA;  Surgeon: Dalia Heading, MD;  Location: [facility] ORS;  Service: General;  Laterality: Right;  procedure end @ 08:23   MYRINGOTOMY WITH TUBE PLACEMENT Bilateral 04/28/2017   Procedure: BILATERAL MYRINGOTOMY WITH TUBE PLACEMENT;  Surgeon: Newman Pies, MD;  Location: [facility] OR;  Service: ENT;  Laterality: Bilateral;   PERIPHERAL VASCULAR BALLOON ANGIOPLASTY Right 05/04/2022   Procedure: PERIPHERAL VASCULAR BALLOON ANGIOPLASTY;  Surgeon: Victorino Sparrow, MD;  Location: [facility] INVASIVE CV LAB;  Service: Cardiovascular;  Laterality: Right;  PT   PERIPHERAL VASCULAR INTERVENTION Right 05/04/2022   Procedure: PERIPHERAL VASCULAR INTERVENTION;   Surgeon: Victorino Sparrow, MD;  Location: [facility] INVASIVE CV LAB;  Service: Cardiovascular;  Laterality: Right;  SFA   PERIPHERAL VASCULAR INTERVENTION Left 07/21/2022   Procedure: PERIPHERAL VASCULAR INTERVENTION;  Surgeon: Nada Libman, MD;  Location: [facility] INVASIVE CV LAB;  Service: Cardiovascular;  Laterality: Left;   REVISION OF ARTERIOVENOUS GORETEX GRAFT Left 05/04/2018   Procedure: TRANSPOSITION OF CEPHALIC VEIN ARTERIOVENOUS FISTULA LEFT ARM;  Surgeon: Larina Earthly, MD;  Location: [facility] OR;  Service: Vascular;  Laterality: Left;   SAVORY DILATION N/A 12/18/2016   Procedure: SAVORY DILATION;  Surgeon: West Bali, MD;  Location: [facility] END[facility]ITE;  Service: Endoscopy;  Laterality: N/A;   TRANSMETATARSAL AMPUTATION Left 08/15/2022   Procedure: TRANSMETATARSAL AMPUTATION;  Surgeon: [name], DPM;  Location: [facility] OR;  Service: Podiatry;  Laterality: Left;      Social History:     Social History   Tobacco Use   Smoking status: Never    Passive exposure: Never   Smokeless tobacco: Never   Tobacco comments:    Verified by Daughter, [name]  Substance Use Topics   Alcohol use: No       Family History :     Family History  Problem Relation Age of Onset   Hypertension Father    Hypercholesterolemia Father    Arthritis Father    Hypertension Sister    Hypercholesterolemia Sister    Breast cancer Sister    Hypertension Sister    Colon cancer Neg Hx    Colon polyps Neg Hx       Home Medications:   Prior to Admission medications   Medication Sig Start Date End Date Taking? Authorizing Provider  amLODipine (NORVASC) 5 MG tablet Take 1 tablet (5 mg total) by mouth daily. 11/12/22   Anabel Halon, MD  aspirin 81 MG chewable tablet CHEW TWO TABLETS BY MOUTH EVERY DAY Patient taking differently: Chew 162 mg by mouth daily. 07/24/22   [name], MD  B Complex-C-Folic Acid (RENA-VITE RX) 1 MG TABS Take 1 tablet by mouth daily. 04/15/22   [provider]   Blood Glucose Monitoring Suppl (ONETOUCH VERIO) w/Device KIT Use to check blood sugar 4X daily. 04/16/22   Carlus Pavlov, MD  calcitRIOL (ROCALTROL) 0.25 MCG capsule TAKE 1 CAPSULE (0.25 MCG TOTAL) BY MOUTH 2 (TWO) TIMES DAILY WITH A MEAL. 06/05/22   Roma Kayser, MD  cinacalcet (SENSIPAR) 30 MG tablet Take 3 tablets (90 mg total) by mouth 3 (three) times a week. 09/09/22   Marguerita Merles Latif, DO  Continuous Blood Gluc Receiver (FREESTYLE LIBRE 2 READER) DEVI 1 each by Does not apply route daily. 04/16/22   Carlus Pavlov, MD  Continuous Blood Gluc Sensor (FREESTYLE LIBRE 2 SENSOR) MISC 1 each by Does not apply route every 14 (fourteen) days. 04/16/22   Carlus Pavlov, MD  dextromethorphan-guaiFENesin [facility] DM) 30-600 MG 12hr tablet Take 1 tablet by mouth 2 (two) times daily as needed for cough. 11/19/22   Vassie Loll, MD  DULoxetine (CYMBALTA) 60 MG capsule TAKE (1) CAPSULE BY MOUTH ONCE DAILY. Patient taking differently: Take 60 mg by mouth daily. 09/17/20   [name], MD  ezetimibe (ZETIA) 10 MG tablet Take 1 tablet (10 mg total) by mouth daily. 05/25/22   [name], MD  FIASP FLEXTOUCH 100 UNIT/ML FlexTouch Pen INJECT 18 TO 30 UNITS INTO THE SKIN WITH BREAKFAST, WITH LUNCH, AND WITH EVENING MEAL Patient taking differently: Inject 18-30 Units into the skin 3 (three) times daily. Sliding scale 10/20/22   Carlus Pavlov, MD  glucose blood (ONETOUCH VERIO) test strip Use as instructed to check blood sugar 4X daily. 04/16/22   Carlus Pavlov, MD  hydrOXYzine (ATARAX) 25 MG tablet Take 25 mg by mouth every 12 (twelve) hours as needed for itching.    [provider]  insulin aspart (NOVOLOG) 100 UNIT/ML injection Inject 1-10 Units into the skin 3 (three) times daily before  meals. Sliding scale 101-150=1 units 151-200=2 units 201-250=4 units 251-300=6 units 301-350=8 units 351-450=10 units Above 450 call MD    [provider]  insulin  glargine, 2 Unit Dial, (TOUJEO MAX SOLOSTAR) 300 UNIT/ML Solostar Pen Inject 27 Units into the skin daily. May need to slowly increase the dose depending upon your blood sugar, follow-up with PCP 11/20/22   Rondel Baton, MD  Insulin Pen Needle 32G X 4 MM MISC Use 4x a day 05/25/22   Carlus Pavlov, MD  metoprolol succinate (TOPROL-XL) 50 MG 24 hr tablet TAKE (1) TABLET BY MOUTH DAILY WITH FOOD *TAKE AFTER DIALYSIS* Patient taking differently: Take 50 mg by mouth See admin instructions. Take Tues, thurs, sat & sunday 07/29/21   [name], MD  ondansetron (ZOFRAN) 4 MG tablet Take 1 tablet (4 mg total) by mouth every 8 (eight) hours as needed for nausea or vomiting. 12/07/22   [name], MD  OneTouch Delica Lancets 33G MISC Use to check blood sugar 4X daily. 04/16/22   Carlus Pavlov, MD  oxyCODONE-acetaminophen (PERCOCET/ROXICET) 5-325 MG tablet Take 1 tablet by mouth every 4 (four) hours as needed. 10/13/22   [name], MD  pregabalin (LYRICA) 50 MG capsule Take 1 capsule (50 mg total) by mouth daily. 11/19/22   Vassie Loll, MD  sertraline (ZOLOFT) 50 MG tablet Take 2 tablets (100 mg total) by mouth daily. 11/19/22   Vassie Loll, MD  [name] 500 MG chewable tablet Chew 500-1,000 mg by mouth See admin instructions. Take 1000mg  (2 tablets) by mouth with meals and 500mg  (1 tablet) with snacks 04/15/22   [provider]  FLUoxetine (PROZAC) 10 MG capsule Take 10 mg by mouth daily.    05/28/11  [provider]  glipiZIDE (GLUCOTROL) 10 MG tablet Take 10 mg by mouth 2 (two) times daily before a meal.    05/28/11  [provider]     Allergies:     Allergies  Allergen Reactions   Ace Inhibitors Anaphylaxis and Swelling   Penicillins Itching and Swelling   Statins Other (See Comments)    elevated LFT's     Albuterol Swelling     Physical Exam:   Vitals  Blood pressure (!) 165/74, pulse 70, temperature 97.6 F (36.4 C), temperature  source Oral, resp. rate 16, height 5' (1.524 m), weight 84.3 kg, SpO2 96%.   1. General Developed female, laying in bed, no apparent distress  2.  Is not, awake, alert, communicative, follows commands, oriented x 2  3. No F.N deficits, ALL C.Nerves Intact, Strength 5/5 all 4 extremities, Sensation intact all 4 extremities, Plantars down going.  4.  Right eye bruised, PERRLA. Moist Oral Mucosa.  5. Supple Neck, No JVD, No cervical lymphadenopathy appriciated, No Carotid Bruits.  6. Symmetrical Chest wall movement, Good air movement bilaterally, CTAB.  7. RRR, No Gallops, Rubs or Murmurs, No Parasternal Heave.  8. Positive Bowel Sounds, Abdomen Soft, No tenderness, No organomegaly appriciated,No rebound -guarding or rigidity.  9.  No Cyanosis, Normal Skin Turgor, No Skin Rash or Bruise.  10. Good muscle tone,  joints appear normal , no effusions, Normal ROM.  Bilateral BKA, left stump with mild dehiscence, please see pictures below.     Data Review:    CBC Recent Labs  Lab 12/15/22 1340 12/17/22 1543  WBC 7.2 7.5  HGB 10.9* 11.1*  HCT 37.0 36.5  PLT 354 309  MCV 81 79.5*  MCH 23.8* 24.2*  MCHC 29.5* 30.4  RDW 18.4* 19.9*  LYMPHSABS 1.0 0.8  MONOABS  --  0.6  EOSABS 0.0 0.0  BASOSABS 0.0 0.0   ------------------------------------------------------------------------------------------------------------------  Chemistries  Recent Labs  Lab 12/15/22 1340 12/17/22 1543  NA  --  138  K  --  3.3*  CL  --  101  CO2  --  25  GLUCOSE  --  140*  BUN  --  34*  CREATININE  --  4.80*  CALCIUM  --  8.7*  AST 19 17  ALT 48* 45*  ALKPHOS 113 96  BILITOT 0.3 0.7   ------------------------------------------------------------------------------------------------------------------ estimated creatinine clearance is 11.1 mL/min (A) (by C-G formula based on SCr of 4.8 mg/dL  (H)). ------------------------------------------------------------------------------------------------------------------ No results for input(s): "TSH", "T4TOTAL", "T3FREE", "THYROIDAB" in the last 72 hours.  Invalid input(s): "FREET3"  Coagulation profile No results for input(s): "INR", "PROTIME" in the last 168 hours. ------------------------------------------------------------------------------------------------------------------- No results for input(s): "DDIMER" in the last 72 hours. -------------------------------------------------------------------------------------------------------------------  Cardiac Enzymes No results for input(s): "CKMB", "TROPONINI", "MYOGLOBIN" in the last 168 hours.  Invalid input(s): "CK" ------------------------------------------------------------------------------------------------------------------    Component Value Date/Time   BNP 521.0 (H) 07/06/2021 0936     ---------------------------------------------------------------------------------------------------------------  Urinalysis    Component Value Date/Time   COLORURINE YELLOW 11/07/2017 2340   APPEARANCEUR HAZY (A) 11/07/2017 2340   LABSPEC 1.017 11/07/2017 2340   PHURINE 5.0 11/07/2017 2340   GLUCOSEU 150 (A) 11/07/2017 2340   HGBUR NEGATIVE 11/07/2017 2340   BILIRUBINUR NEGATIVE 11/07/2017 2340   BILIRUBINUR neg 02/15/2017 0913   KETONESUR NEGATIVE 11/07/2017 2340   PROTEINUR >=300 (A) 11/07/2017 2340   UROBILINOGEN 0.2 02/15/2017 0913   NITRITE NEGATIVE 11/07/2017 2340   LEUKOCYTESUR NEGATIVE 11/07/2017 2340    ----------------------------------------------------------------------------------------------------------------   Imaging Results:    CT Head Wo Contrast  Addendum Date: 12/17/2022   ADDENDUM REPORT: 12/17/2022 17:36 ADDENDUM: These results were called by telephone at the time of interpretation on 12/17/2022 at 5:36 pm to provider Beckey Downing, MD, who verbally  acknowledged these results. Electronically Signed   By: Helyn Numbers M.D.   On: 12/17/2022 17:36   Result Date: 12/17/2022 CLINICAL DATA:  Head trauma, GCS=15, loss of consciousness (LOC) (Ped 0-17y); Neck trauma (Age >= 65y). Fall. EXAM: CT HEAD WITHOUT CONTRAST CT MAXILLOFACIAL WITHOUT CONTRAST CT CERVICAL SPINE WITHOUT CONTRAST TECHNIQUE: Multidetector CT imaging of the head, cervical spine, and maxillofacial structures were performed using the standard protocol without intravenous contrast. Multiplanar CT image reconstructions of the cervical spine and maxillofacial structures were also generated. RADIATION DOSE REDUCTION: This exam was performed according to the departmental dose-optimization program which includes automated exposure control, adjustment of the mA and/or kV according to patient size and/or use of iterative reconstruction technique. COMPARISON:  MRI 10/02/2021 FINDINGS: CT HEAD FINDINGS Brain: There is a left parietal subacute cortical infarct within the posterior left MCA territory. No superimposed acute intracranial hemorrhage or associated abnormal mass effect. No midline shift. Ventricular size is normal. Cerebellum is unremarkable. Vascular: No hyperdense vessel or unexpected calcification. Skull: Normal. Negative for fracture or focal lesion. Other: Mastoid air cells and middle ear cavities are clear CT MAXILLOFACIAL FINDINGS Osseous: No fracture or mandibular dislocation. No destructive process. Orbits: Negative. No traumatic or inflammatory finding. Sinuses: Clear. Soft tissues: Negative. CT CERVICAL SPINE FINDINGS Alignment: Mild cervical lordosis, likely positional.  No listhesis. Skull base and vertebrae: Craniocervical alignment is normal. Landau dental dental interval is not widened. No acute fracture of the cervical spine. Vertebral body height is preserved. Soft tissues and spinal  canal: No prevertebral fluid or swelling. No visible canal hematoma. Disc levels: Intervertebral  disc heights are largely preserved. Minimal degenerative disc annular calcifications noted at C3-C6. Prevertebral soft tissues are not thickened on sagittal reformats. Spinal canal is widely patent. No significant neuroforaminal narrowing. Upper chest: Negative. Other: None IMPRESSION: 1. Subacute left parietal cortical infarct within the posterior left MCA territory. No superimposed acute intracranial hemorrhage or associated abnormal mass effect. 2. No acute facial bone fracture. 3. No acute fracture or listhesis of the cervical spine. Electronically Signed: By: Helyn Numbers M.D. On: 12/17/2022 17:31   CT Maxillofacial Wo Contrast  Addendum Date: 12/17/2022   ADDENDUM REPORT: 12/17/2022 17:36 ADDENDUM: These results were called by telephone at the time of interpretation on 12/17/2022 at 5:36 pm to provider Beckey Downing, MD, who verbally acknowledged these results. Electronically Signed   By: Helyn Numbers M.D.   On: 12/17/2022 17:36   Result Date: 12/17/2022 CLINICAL DATA:  Head trauma, GCS=15, loss of consciousness (LOC) (Ped 0-17y); Neck trauma (Age >= 65y). Fall. EXAM: CT HEAD WITHOUT CONTRAST CT MAXILLOFACIAL WITHOUT CONTRAST CT CERVICAL SPINE WITHOUT CONTRAST TECHNIQUE: Multidetector CT imaging of the head, cervical spine, and maxillofacial structures were performed using the standard protocol without intravenous contrast. Multiplanar CT image reconstructions of the cervical spine and maxillofacial structures were also generated. RADIATION DOSE REDUCTION: This exam was performed according to the departmental dose-optimization program which includes automated exposure control, adjustment of the mA and/or kV according to patient size and/or use of iterative reconstruction technique. COMPARISON:  MRI 10/02/2021 FINDINGS: CT HEAD FINDINGS Brain: There is a left parietal subacute cortical infarct within the posterior left MCA territory. No superimposed acute intracranial hemorrhage or associated abnormal  mass effect. No midline shift. Ventricular size is normal. Cerebellum is unremarkable. Vascular: No hyperdense vessel or unexpected calcification. Skull: Normal. Negative for fracture or focal lesion. Other: Mastoid air cells and middle ear cavities are clear CT MAXILLOFACIAL FINDINGS Osseous: No fracture or mandibular dislocation. No destructive process. Orbits: Negative. No traumatic or inflammatory finding. Sinuses: Clear. Soft tissues: Negative. CT CERVICAL SPINE FINDINGS Alignment: Mild cervical lordosis, likely positional.  No listhesis. Skull base and vertebrae: Craniocervical alignment is normal. Landau dental dental interval is not widened. No acute fracture of the cervical spine. Vertebral body height is preserved. Soft tissues and spinal canal: No prevertebral fluid or swelling. No visible canal hematoma. Disc levels: Intervertebral disc heights are largely preserved. Minimal degenerative disc annular calcifications noted at C3-C6. Prevertebral soft tissues are not thickened on sagittal reformats. Spinal canal is widely patent. No significant neuroforaminal narrowing. Upper chest: Negative. Other: None IMPRESSION: 1. Subacute left parietal cortical infarct within the posterior left MCA territory. No superimposed acute intracranial hemorrhage or associated abnormal mass effect. 2. No acute facial bone fracture. 3. No acute fracture or listhesis of the cervical spine. Electronically Signed: By: Helyn Numbers M.D. On: 12/17/2022 17:31   CT Cervical Spine Wo Contrast  Addendum Date: 12/17/2022   ADDENDUM REPORT: 12/17/2022 17:36 ADDENDUM: These results were called by telephone at the time of interpretation on 12/17/2022 at 5:36 pm to provider Beckey Downing, MD, who verbally acknowledged these results. Electronically Signed   By: Helyn Numbers M.D.   On: 12/17/2022 17:36   Result Date: 12/17/2022 CLINICAL DATA:  Head trauma, GCS=15, loss of consciousness (LOC) (Ped 0-17y); Neck trauma (Age >= 65y). Fall.  EXAM: CT HEAD WITHOUT CONTRAST CT MAXILLOFACIAL WITHOUT CONTRAST CT CERVICAL SPINE WITHOUT CONTRAST TECHNIQUE: Multidetector CT imaging of the head,  cervical spine, and maxillofacial structures were performed using the standard protocol without intravenous contrast. Multiplanar CT image reconstructions of the cervical spine and maxillofacial structures were also generated. RADIATION DOSE REDUCTION: This exam was performed according to the departmental dose-optimization program which includes automated exposure control, adjustment of the mA and/or kV according to patient size and/or use of iterative reconstruction technique. COMPARISON:  MRI 10/02/2021 FINDINGS: CT HEAD FINDINGS Brain: There is a left parietal subacute cortical infarct within the posterior left MCA territory. No superimposed acute intracranial hemorrhage or associated abnormal mass effect. No midline shift. Ventricular size is normal. Cerebellum is unremarkable. Vascular: No hyperdense vessel or unexpected calcification. Skull: Normal. Negative for fracture or focal lesion. Other: Mastoid air cells and middle ear cavities are clear CT MAXILLOFACIAL FINDINGS Osseous: No fracture or mandibular dislocation. No destructive process. Orbits: Negative. No traumatic or inflammatory finding. Sinuses: Clear. Soft tissues: Negative. CT CERVICAL SPINE FINDINGS Alignment: Mild cervical lordosis, likely positional.  No listhesis. Skull base and vertebrae: Craniocervical alignment is normal. Landau dental dental interval is not widened. No acute fracture of the cervical spine. Vertebral body height is preserved. Soft tissues and spinal canal: No prevertebral fluid or swelling. No visible canal hematoma. Disc levels: Intervertebral disc heights are largely preserved. Minimal degenerative disc annular calcifications noted at C3-C6. Prevertebral soft tissues are not thickened on sagittal reformats. Spinal canal is widely patent. No significant neuroforaminal  narrowing. Upper chest: Negative. Other: None IMPRESSION: 1. Subacute left parietal cortical infarct within the posterior left MCA territory. No superimposed acute intracranial hemorrhage or associated abnormal mass effect. 2. No acute facial bone fracture. 3. No acute fracture or listhesis of the cervical spine. Electronically Signed: By: Helyn Numbers M.D. On: 12/17/2022 17:31   DG Chest Portable 1 View  Result Date: 12/17/2022 CLINICAL DATA:  Altered mental status. EXAM: PORTABLE CHEST 1 VIEW COMPARISON:  11/18/2022 FINDINGS: Stable enlarged cardiac silhouette and tortuous, partially calcified thoracic aorta. Decreased patchy and linear density at the left lung base with a small left pleural effusion. Clear right lung. Thoracic spine degenerative changes. IMPRESSION: Decreased left basilar atelectasis and probable pneumonia with a small left pleural effusion. Electronically Signed   By: Beckie Salts M.D.   On: 12/17/2022 17:18     EKG:  Vent. 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 -Will check MRI brain -Check lipid panel -Will check A1c  -PT/OT/SLP - teleneuro consult -For permissive hypertension  Update: -Patient has failed bedside swallow screen, so she will be kept n.p.o. except meds, will hold her insulin for now  -ESRD -Continue hemodialysis Monday, Wednesday and Friday as per outpatient schedule, will place routine nephrology consult for HD tomorrow. -cont with home dose torsemide and [name]  Type 2 diabetes with nephropathy -Will check A1c -Glargine, will resume at a lower dose (will lower from 27-18) -Will keep on insulin sliding scale.   hypertension -Hold home meds and allow for permissive hypertension   hyperlipidemia -check lipid panel, continue with home Zetia   peripheral arterial disease -Continue with aspirin and Zetia, status post bilateral BKA  Hypokalemia -Management with HD   class II obesity -Body mass index is 36.3 kg/m.  Bilateral BKA Mild wound dehiscence -Site of left BKA wound dehiscence appears to be improving, she is following with Dr. Lajoyce Corners, next appointment in 2 weeks, will request wound care consult     DVT Prophylaxis Heparin   AM Labs Ordered, also please review Full Orders  Family Communication: Admission, patients condition and plan of care including tests being ordered have been discussed with the patient and husband at bedside who indicate understanding and agree with the plan and Code Status.  Code Status full  Likely DC to  home  Condition GUARDED    Consults called: renal and teleneuro requested in EPIC    Admission status: inpatient    Time spent in minutes : 70 minutes   Huey Bienenstock M.D on 12/17/2022 at 6:10 PM   [facility] - Office  [phone]

## 2022-12-17 NOTE — ED Provider Notes (Signed)
[place] EMERGENCY DEPARTMENT AT [facility] Provider Note   CSN: [id number] Arrival date & time: 12/17/22  1256     History  Chief Complaint  Patient presents with   Marletta Lor    Kathryn Beck is a 67 y.o. female.  He has PMH of memory loss, ESRD on dialysis Monday Wednesday Friday, hypertension, lupus, CKD, CHF.  Presents for evaluation today for injury to right eye.  Has been reports she fell out of her chair yesterday, was going from wheelchair to a stool and slipped and fell, hit the right side of her face on the ground.  Denies any injuries, no LOC.  Patient is a poor historian due to her memory loss, though states she did not pass out or have chest pain or palpitations or other symptoms with this.  She has no neck pain, no numbness tingling or weakness.  Her husband notes that her confusion seems to be slightly worsened at this time, states this happened in the past as well, not sure if it related to injury or not.  She did dialyze yesterday the full amount.   Fall       Home Medications Prior to Admission medications   Medication Sig Start Date End Date Taking? Authorizing Provider  amLODipine (NORVASC) 5 MG tablet Take 1 tablet (5 mg total) by mouth daily. 11/12/22  Yes Anabel Halon, MD  aspirin 81 MG chewable tablet CHEW TWO TABLETS BY MOUTH EVERY DAY Patient taking differently: Chew 162 mg by mouth daily. 07/24/22  Yes [name], MD  B Complex-C-Folic Acid (RENA-VITE RX) 1 MG TABS Take 1 tablet by mouth daily. 04/15/22  Yes [provider]  cinacalcet (SENSIPAR) 30 MG tablet Take 3 tablets (90 mg total) by mouth 3 (three) times a week. 09/09/22  Yes Sheikh, Omair Latif, DO  DULoxetine (CYMBALTA) 60 MG capsule TAKE (1) CAPSULE BY MOUTH ONCE DAILY. Patient taking differently: Take 60 mg by mouth daily. 09/17/20  Yes [name], MD  ezetimibe (ZETIA) 10 MG tablet Take 1 tablet (10 mg total) by mouth daily. 05/25/22  Yes [name], MD   FIASP FLEXTOUCH 100 UNIT/ML FlexTouch Pen INJECT 18 TO 30 UNITS INTO THE SKIN WITH BREAKFAST, WITH LUNCH, AND WITH EVENING MEAL Patient taking differently: Inject 18-30 Units into the skin 3 (three) times daily. Sliding scale 10/20/22  Yes Carlus Pavlov, MD  insulin glargine, 2 Unit Dial, (TOUJEO MAX SOLOSTAR) 300 UNIT/ML Solostar Pen Inject 27 Units into the skin daily. May need to slowly increase the dose depending upon your blood sugar, follow-up with PCP 11/20/22  Yes Rondel Baton, MD  metoprolol succinate (TOPROL-XL) 50 MG 24 hr tablet TAKE (1) TABLET BY MOUTH DAILY WITH FOOD *TAKE AFTER DIALYSIS* Patient taking differently: Take 50 mg by mouth See admin instructions. Take Tues, thurs, sat & sunday 07/29/21  Yes [name], MD  torsemide (DEMADEX) 100 MG tablet Take 100 mg by mouth 2 (two) times daily. 12/01/22  Yes [provider]  [name] 500 MG chewable tablet Chew 500-1,000 mg by mouth See admin instructions. Take 1000mg  (2 tablets) by mouth with meals and 500mg  (1 tablet) with snacks 04/15/22  Yes [provider]  Blood Glucose Monitoring Suppl (ONETOUCH VERIO) w/Device KIT Use to check blood sugar 4X daily. 04/16/22   Carlus Pavlov, MD  Continuous Blood Gluc Receiver (FREESTYLE LIBRE 2 READER) DEVI 1 each by Does not apply route daily. 04/16/22   Carlus Pavlov, MD  Continuous Blood Gluc Sensor (FREESTYLE LIBRE 2 SENSOR) MISC 1 each by Does not apply route every 14 (fourteen) days. 04/16/22   Carlus Pavlov, MD  glucose blood (ONETOUCH VERIO) test strip Use as instructed to check blood sugar 4X daily. 04/16/22   Carlus Pavlov, MD  Insulin Pen Needle 32G X 4 MM MISC Use 4x a day 05/25/22   Carlus Pavlov, MD  OneTouch Delica Lancets 33G MISC Use to check blood sugar 4X daily. 04/16/22   Carlus Pavlov, MD  FLUoxetine (PROZAC) 10 MG capsule Take 10 mg by mouth daily.    05/28/11  [provider]  glipiZIDE (GLUCOTROL) 10 MG tablet Take 10  mg by mouth 2 (two) times daily before a meal.    05/28/11  [provider]      Allergies    Ace inhibitors, Penicillins, Statins, and Albuterol    Review of Systems   Review of Systems  Physical Exam Updated Vital Signs BP (!) 153/58   Pulse 70   Temp 97.6 F (36.4 C) (Oral)   Resp 20   Ht 5' (1.524 m)   Wt 84.3 kg   SpO2 96%   BMI 36.30 kg/m  Physical Exam Vitals and nursing note reviewed.  Constitutional:      General: She is not in acute distress.    Appearance: She is well-developed.  HENT:     Head: Normocephalic and atraumatic.     Mouth/Throat:     Mouth: Mucous membranes are moist.  Eyes:     General: Lids are normal. No visual field deficit.       Right eye: No foreign body.     Extraocular Movements: Extraocular movements intact.     Conjunctiva/sclera:     Right eye: Right conjunctiva is injected. No chemosis, exudate or hemorrhage. Cardiovascular:     Rate and Rhythm: Normal rate and regular rhythm.     Heart sounds: No murmur heard. Pulmonary:     Effort: Pulmonary effort is normal. No respiratory distress.     Breath sounds: Normal breath sounds.  Abdominal:     Palpations: Abdomen is soft.     Tenderness: There is no abdominal tenderness.  Musculoskeletal:        General: No swelling. Normal range of motion.     Cervical back: Neck supple.  Skin:    General: Skin is warm and dry.     Capillary Refill: Capillary refill takes less than 2 seconds.  Neurological:     General: No focal deficit present.     Mental Status: She is alert. She is disoriented.     Sensory: No sensory deficit.     Motor: No weakness.     Coordination: Coordination normal.     Comments: Was confused, unable to give me correct dates of her dialysis, not able to tell me that month or year  Psychiatric:        Mood and Affect: Mood normal.     ED Results / Procedures / Treatments   Labs (all labs ordered are listed, but only abnormal results are  displayed) Labs Reviewed  CBC WITH DIFFERENTIAL/PLATELET - Abnormal; Notable for the following components:      Result Value   Hemoglobin 11.1 (*)    MCV 79.5 (*)    MCH 24.2 (*)    RDW 19.9 (*)    nRBC 1.1 (*)    All other components within normal limits  COMPREHENSIVE METABOLIC PANEL - Abnormal; Notable for the following  components:   Potassium 3.3 (*)    Glucose, Bld 140 (*)    BUN 34 (*)    Creatinine, Ser 4.80 (*)    Calcium 8.7 (*)    Total Protein 6.0 (*)    Albumin 3.2 (*)    ALT 45 (*)    GFR, Estimated 9 (*)    All other components within normal limits  HEPATITIS B SURFACE ANTIGEN    EKG EKG Interpretation Date/Time:  Thursday December 17 2022 15:27:57 EDT Ventricular Rate:  74 PR Interval:  145 QRS Duration:  92 QT Interval:  421 QTC Calculation: 468 R Axis:   65  Text Interpretation: Sinus rhythm Probable left atrial enlargement No acute changes No significant change since last tracing Confirmed by Derwood Kaplan ([id number]) on 12/17/2022 3:31:28 PM  Radiology CT Head Wo Contrast  Addendum Date: 12/17/2022   ADDENDUM REPORT: 12/17/2022 17:36 ADDENDUM: These results were called by telephone at the time of interpretation on 12/17/2022 at 5:36 pm to provider Beckey Downing, MD, who verbally acknowledged these results. Electronically Signed   By: Helyn Numbers M.D.   On: 12/17/2022 17:36   Result Date: 12/17/2022 CLINICAL DATA:  Head trauma, GCS=15, loss of consciousness (LOC) (Ped 0-17y); Neck trauma (Age >= 65y). Fall. EXAM: CT HEAD WITHOUT CONTRAST CT MAXILLOFACIAL WITHOUT CONTRAST CT CERVICAL SPINE WITHOUT CONTRAST TECHNIQUE: Multidetector CT imaging of the head, cervical spine, and maxillofacial structures were performed using the standard protocol without intravenous contrast. Multiplanar CT image reconstructions of the cervical spine and maxillofacial structures were also generated. RADIATION DOSE REDUCTION: This exam was performed according to the departmental  dose-optimization program which includes automated exposure control, adjustment of the mA and/or kV according to patient size and/or use of iterative reconstruction technique. COMPARISON:  MRI 10/02/2021 FINDINGS: CT HEAD FINDINGS Brain: There is a left parietal subacute cortical infarct within the posterior left MCA territory. No superimposed acute intracranial hemorrhage or associated abnormal mass effect. No midline shift. Ventricular size is normal. Cerebellum is unremarkable. Vascular: No hyperdense vessel or unexpected calcification. Skull: Normal. Negative for fracture or focal lesion. Other: Mastoid air cells and middle ear cavities are clear CT MAXILLOFACIAL FINDINGS Osseous: No fracture or mandibular dislocation. No destructive process. Orbits: Negative. No traumatic or inflammatory finding. Sinuses: Clear. Soft tissues: Negative. CT CERVICAL SPINE FINDINGS Alignment: Mild cervical lordosis, likely positional.  No listhesis. Skull base and vertebrae: Craniocervical alignment is normal. Landau dental dental interval is not widened. No acute fracture of the cervical spine. Vertebral body height is preserved. Soft tissues and spinal canal: No prevertebral fluid or swelling. No visible canal hematoma. Disc levels: Intervertebral disc heights are largely preserved. Minimal degenerative disc annular calcifications noted at C3-C6. Prevertebral soft tissues are not thickened on sagittal reformats. Spinal canal is widely patent. No significant neuroforaminal narrowing. Upper chest: Negative. Other: None IMPRESSION: 1. Subacute left parietal cortical infarct within the posterior left MCA territory. No superimposed acute intracranial hemorrhage or associated abnormal mass effect. 2. No acute facial bone fracture. 3. No acute fracture or listhesis of the cervical spine. Electronically Signed: By: Helyn Numbers M.D. On: 12/17/2022 17:31   CT Maxillofacial Wo Contrast  Addendum Date: 12/17/2022   ADDENDUM REPORT:  12/17/2022 17:36 ADDENDUM: These results were called by telephone at the time of interpretation on 12/17/2022 at 5:36 pm to provider Beckey Downing, MD, who verbally acknowledged these results. Electronically Signed   By: Helyn Numbers M.D.   On: 12/17/2022 17:36   Result Date: 12/17/2022 CLINICAL DATA:  Head trauma, GCS=15, loss of consciousness (LOC) (Ped 0-17y); Neck trauma (Age >= 65y). Fall. EXAM: CT HEAD WITHOUT CONTRAST CT MAXILLOFACIAL WITHOUT CONTRAST CT CERVICAL SPINE WITHOUT CONTRAST TECHNIQUE: Multidetector CT imaging of the head, cervical spine, and maxillofacial structures were performed using the standard protocol without intravenous contrast. Multiplanar CT image reconstructions of the cervical spine and maxillofacial structures were also generated. RADIATION DOSE REDUCTION: This exam was performed according to the departmental dose-optimization program which includes automated exposure control, adjustment of the mA and/or kV according to patient size and/or use of iterative reconstruction technique. COMPARISON:  MRI 10/02/2021 FINDINGS: CT HEAD FINDINGS Brain: There is a left parietal subacute cortical infarct within the posterior left MCA territory. No superimposed acute intracranial hemorrhage or associated abnormal mass effect. No midline shift. Ventricular size is normal. Cerebellum is unremarkable. Vascular: No hyperdense vessel or unexpected calcification. Skull: Normal. Negative for fracture or focal lesion. Other: Mastoid air cells and middle ear cavities are clear CT MAXILLOFACIAL FINDINGS Osseous: No fracture or mandibular dislocation. No destructive process. Orbits: Negative. No traumatic or inflammatory finding. Sinuses: Clear. Soft tissues: Negative. CT CERVICAL SPINE FINDINGS Alignment: Mild cervical lordosis, likely positional.  No listhesis. Skull base and vertebrae: Craniocervical alignment is normal. Landau dental dental interval is not widened. No acute fracture of the cervical  spine. Vertebral body height is preserved. Soft tissues and spinal canal: No prevertebral fluid or swelling. No visible canal hematoma. Disc levels: Intervertebral disc heights are largely preserved. Minimal degenerative disc annular calcifications noted at C3-C6. Prevertebral soft tissues are not thickened on sagittal reformats. Spinal canal is widely patent. No significant neuroforaminal narrowing. Upper chest: Negative. Other: None IMPRESSION: 1. Subacute left parietal cortical infarct within the posterior left MCA territory. No superimposed acute intracranial hemorrhage or associated abnormal mass effect. 2. No acute facial bone fracture. 3. No acute fracture or listhesis of the cervical spine. Electronically Signed: By: Helyn Numbers M.D. On: 12/17/2022 17:31   CT Cervical Spine Wo Contrast  Addendum Date: 12/17/2022   ADDENDUM REPORT: 12/17/2022 17:36 ADDENDUM: These results were called by telephone at the time of interpretation on 12/17/2022 at 5:36 pm to provider Beckey Downing, MD, who verbally acknowledged these results. Electronically Signed   By: Helyn Numbers M.D.   On: 12/17/2022 17:36   Result Date: 12/17/2022 CLINICAL DATA:  Head trauma, GCS=15, loss of consciousness (LOC) (Ped 0-17y); Neck trauma (Age >= 65y). Fall. EXAM: CT HEAD WITHOUT CONTRAST CT MAXILLOFACIAL WITHOUT CONTRAST CT CERVICAL SPINE WITHOUT CONTRAST TECHNIQUE: Multidetector CT imaging of the head, cervical spine, and maxillofacial structures were performed using the standard protocol without intravenous contrast. Multiplanar CT image reconstructions of the cervical spine and maxillofacial structures were also generated. RADIATION DOSE REDUCTION: This exam was performed according to the departmental dose-optimization program which includes automated exposure control, adjustment of the mA and/or kV according to patient size and/or use of iterative reconstruction technique. COMPARISON:  MRI 10/02/2021 FINDINGS: CT HEAD FINDINGS Brain:  There is a left parietal subacute cortical infarct within the posterior left MCA territory. No superimposed acute intracranial hemorrhage or associated abnormal mass effect. No midline shift. Ventricular size is normal. Cerebellum is unremarkable. Vascular: No hyperdense vessel or unexpected calcification. Skull: Normal. Negative for fracture or focal lesion. Other: Mastoid air cells and middle ear cavities are clear CT MAXILLOFACIAL FINDINGS Osseous: No fracture or mandibular dislocation. No destructive process. Orbits: Negative. No traumatic or inflammatory finding. Sinuses: Clear. Soft tissues: Negative. CT CERVICAL SPINE FINDINGS Alignment: Mild cervical lordosis, likely positional.  No listhesis. Skull base and vertebrae: Craniocervical alignment is normal. Landau dental dental interval is not widened. No acute fracture of the cervical spine. Vertebral body height is preserved. Soft tissues and spinal canal: No prevertebral fluid or swelling. No visible canal hematoma. Disc levels: Intervertebral disc heights are largely preserved. Minimal degenerative disc annular calcifications noted at C3-C6. Prevertebral soft tissues are not thickened on sagittal reformats. Spinal canal is widely patent. No significant neuroforaminal narrowing. Upper chest: Negative. Other: None IMPRESSION: 1. Subacute left parietal cortical infarct within the posterior left MCA territory. No superimposed acute intracranial hemorrhage or associated abnormal mass effect. 2. No acute facial bone fracture. 3. No acute fracture or listhesis of the cervical spine. Electronically Signed: By: Helyn Numbers M.D. On: 12/17/2022 17:31   DG Chest Portable 1 View  Result Date: 12/17/2022 CLINICAL DATA:  Altered mental status. EXAM: PORTABLE CHEST 1 VIEW COMPARISON:  11/18/2022 FINDINGS: Stable enlarged cardiac silhouette and tortuous, partially calcified thoracic aorta. Decreased patchy and linear density at the left lung base with a small left  pleural effusion. Clear right lung. Thoracic spine degenerative changes. IMPRESSION: Decreased left basilar atelectasis and probable pneumonia with a small left pleural effusion. Electronically Signed   By: Beckie Salts M.D.   On: 12/17/2022 17:18    Procedures Procedures    Medications Ordered in ED Medications  aspirin tablet 325 mg (has no administration in time range)  influenza vaccine adjuvanted (FLUAD) injection 0.5 mL (has no administration in time range)  fluorescein ophthalmic strip 1 strip (1 strip Right Eye Given by Other 12/17/22 1610)  tetracaine (PONTOCAINE) 0.5 % ophthalmic solution 1 drop (1 drop Right Eye Given by Other 12/17/22 1610)  Tdap (BOOSTRIX) injection 0.5 mL (0.5 mLs Intramuscular Given 12/17/22 1557)    ED Course/ Medical Decision Making/ A&P Clinical Course as of 12/17/22 1955  Thu Dec 17, 2022  1436 Presents for a fall yesterday, she is ESRD on dialysis, has history of memory loss, had a fall and gave her wheelchair to a stool yesterday, patient's husband witnessed this and states she just slipped, no loss of consciousness.  Not on blood thinners, no shortness of breath.  States her confusion seems slightly worse today. [CB]    Clinical Course User Index [CB] Josem Kaufmann                                 Medical Decision Making Ddx: Concussion, intracranial hemorrhage, subconjunctival hemorrhage, conjunctivitis, corneal abrasion, electrolyte abnormality, other  ED course: Patient history of memory loss, ESRD on dialysis Monday Wednesday Friday had a witnessed mechanical fall per her husband and struck the right side of her face on the ground, she has no other injuries.  Exam of her eye shows no corneal abrasion, no hyphema, she just has some mild conjunctival injection and a small abrasion to the right cheek.  Has no pain in the eye and denies vision changes. She was noted to have some mild confusion.  She has documented history of memory impairment,  husband states her memory seems to wax and wane.  EKG and basic labs ordered out of abundance of caution.  Patient has very mild hypokalemia, no leukocytosis, normal hemoglobin.  No focal deficits on exam.  Patient unable to produce urine at this time but not having dysuria, no abdominal flank pain, very low suspicion for UTI given her confusion seems to be an ongoing issue.  Notified by radiology CT shows subacute infarct.  Sowles: Discussed with Dr. Iver Nestle of neurology, she feels routine consult with teleneurology and admission to [facility] is reasonable.  Discussed with Dr. Randol Kern who will admit the patient.  Amount and/or Complexity of Data Reviewed Labs: ordered. Decision-making details documented in ED Course. Radiology: ordered.  Risk Prescription drug management. Decision regarding hospitalization.     This chart has been completed using Engineer, civil (consulting) software, and while attempts have been made to ensure accuracy, certain words and phrases may not be transcribed as intended.         Final Clinical Impression(s) / ED Diagnoses Final diagnoses:  Cerebrovascular accident (CVA), unspecified mechanism [facility])    Rx / DC Orders ED Discharge Orders     None         Josem Kaufmann 12/17/22 1956    Derwood Kaplan, MD 12/18/22 1528

## 2022-12-17 NOTE — ED Notes (Signed)
Pyxis will not open for 325mg  ASA Changed to 324mg  ASA

## 2022-12-17 NOTE — ED Notes (Signed)
Pt stated she fell going up the stairs yesterday Complains of HA Noted Scratch across RIGHT side of face, over eye lid and on forehead RIGHT eye is red CT ordered

## 2022-12-17 NOTE — Telephone Encounter (Signed)
Sarah with [name] calling says pt fell trying to get from her wheelchair to the toilet- hit on the cheek and eye area and is a little swollen and some broken blood vessels in eye. Pt reported a headache that tylenol was not helping- also reports some confusion. Sarah sent her to ED to get checked out and is requesting 1 week 2 in order to follow up after- today was supposed to be her last day nursing. Please advise [phone] Thank you

## 2022-12-18 ENCOUNTER — Inpatient Hospital Stay (HOSPITAL_BASED_OUTPATIENT_CLINIC_OR_DEPARTMENT_OTHER): Payer: Medicare PPO

## 2022-12-18 ENCOUNTER — Inpatient Hospital Stay (HOSPITAL_COMMUNITY): Payer: Medicare PPO

## 2022-12-18 DIAGNOSIS — Z89511 Acquired absence of right leg below knee: Secondary | ICD-10-CM

## 2022-12-18 DIAGNOSIS — I6389 Other cerebral infarction: Secondary | ICD-10-CM | POA: Diagnosis not present

## 2022-12-18 DIAGNOSIS — Z992 Dependence on renal dialysis: Secondary | ICD-10-CM | POA: Diagnosis not present

## 2022-12-18 DIAGNOSIS — Y92009 Unspecified place in unspecified non-institutional (private) residence as the place of occurrence of the external cause: Secondary | ICD-10-CM

## 2022-12-18 DIAGNOSIS — W19XXXA Unspecified fall, initial encounter: Secondary | ICD-10-CM | POA: Diagnosis not present

## 2022-12-18 DIAGNOSIS — E1122 Type 2 diabetes mellitus with diabetic chronic kidney disease: Secondary | ICD-10-CM

## 2022-12-18 DIAGNOSIS — I120 Hypertensive chronic kidney disease with stage 5 chronic kidney disease or end stage renal disease: Secondary | ICD-10-CM | POA: Diagnosis not present

## 2022-12-18 DIAGNOSIS — Z23 Encounter for immunization: Secondary | ICD-10-CM | POA: Diagnosis not present

## 2022-12-18 DIAGNOSIS — E782 Mixed hyperlipidemia: Secondary | ICD-10-CM | POA: Diagnosis not present

## 2022-12-18 DIAGNOSIS — T8781 Dehiscence of amputation stump: Secondary | ICD-10-CM | POA: Diagnosis not present

## 2022-12-18 DIAGNOSIS — R41841 Cognitive communication deficit: Secondary | ICD-10-CM | POA: Diagnosis not present

## 2022-12-18 DIAGNOSIS — D631 Anemia in chronic kidney disease: Secondary | ICD-10-CM | POA: Diagnosis not present

## 2022-12-18 DIAGNOSIS — I739 Peripheral vascular disease, unspecified: Secondary | ICD-10-CM | POA: Diagnosis not present

## 2022-12-18 DIAGNOSIS — I5032 Chronic diastolic (congestive) heart failure: Secondary | ICD-10-CM | POA: Diagnosis not present

## 2022-12-18 DIAGNOSIS — S0591XA Unspecified injury of right eye and orbit, initial encounter: Secondary | ICD-10-CM | POA: Diagnosis not present

## 2022-12-18 DIAGNOSIS — N250 Renal osteodystrophy: Secondary | ICD-10-CM | POA: Diagnosis not present

## 2022-12-18 DIAGNOSIS — Z794 Long term (current) use of insulin: Secondary | ICD-10-CM

## 2022-12-18 DIAGNOSIS — I132 Hypertensive heart and chronic kidney disease with heart failure and with stage 5 chronic kidney disease, or end stage renal disease: Secondary | ICD-10-CM | POA: Diagnosis not present

## 2022-12-18 DIAGNOSIS — I6523 Occlusion and stenosis of bilateral carotid arteries: Secondary | ICD-10-CM | POA: Diagnosis not present

## 2022-12-18 DIAGNOSIS — I639 Cerebral infarction, unspecified: Secondary | ICD-10-CM | POA: Diagnosis not present

## 2022-12-18 DIAGNOSIS — N186 End stage renal disease: Secondary | ICD-10-CM | POA: Diagnosis not present

## 2022-12-18 DIAGNOSIS — E876 Hypokalemia: Secondary | ICD-10-CM | POA: Diagnosis not present

## 2022-12-18 LAB — LIPID PANEL
Cholesterol: 123 mg/dL (ref 0–200)
HDL: 41 mg/dL (ref >40)
LDL Cholesterol: 61 mg/dL (ref 0–99)
Total CHOL/HDL Ratio: 3 {ratio}
Triglycerides: 105 mg/dL (ref <150)
VLDL: 21 mg/dL (ref 0–40)

## 2022-12-18 LAB — ECHOCARDIOGRAM COMPLETE
AR max vel: 1.42 cm2
AV Area VTI: 1.57 cm2
AV Area mean vel: 1.47 cm2
AV Mean grad: 10.7 mm[Hg]
AV Peak grad: 20.7 mm[Hg]
Ao pk vel: 2.28 m/s
Area-P 1/2: 5.75 cm2
Height: 60 in
MV VTI: 1.96 cm2
S' Lateral: 3.8 cm
Weight: 2973.56 [oz_av]

## 2022-12-18 LAB — GLUCOSE, CAPILLARY
Glucose-Capillary: 158 mg/dL — ABNORMAL HIGH (ref 70–99)
Glucose-Capillary: 185 mg/dL — ABNORMAL HIGH (ref 70–99)
Glucose-Capillary: 197 mg/dL — ABNORMAL HIGH (ref 70–99)
Glucose-Capillary: 234 mg/dL — ABNORMAL HIGH (ref 70–99)
Glucose-Capillary: 250 mg/dL — ABNORMAL HIGH (ref 70–99)
Glucose-Capillary: 62 mg/dL — ABNORMAL LOW (ref 70–99)
Glucose-Capillary: 85 mg/dL (ref 70–99)

## 2022-12-18 LAB — HEPATITIS B SURFACE ANTIGEN
Hepatitis B Surface Ag: NONREACTIVE
Hepatitis B Surface Ag: NONREACTIVE

## 2022-12-18 MED ORDER — MEDIHONEY WOUND/BURN DRESSING EX PSTE
1.0000 | PASTE | Freq: Every day | CUTANEOUS | Status: DC
  Administered 2022-12-18: 1 via TOPICAL
  Filled 2022-12-18 (×3): qty 44

## 2022-12-18 MED ORDER — ONDANSETRON HCL 4 MG/2ML IJ SOLN
4.0000 mg | Freq: Four times a day (QID) | INTRAMUSCULAR | Status: DC | PRN
  Administered 2022-12-18: 4 mg via INTRAVENOUS
  Filled 2022-12-18: qty 2

## 2022-12-18 MED ORDER — INSULIN ASPART 100 UNIT/ML IJ SOLN: 0.0000 [IU] | Freq: Every day | INTRAMUSCULAR | Status: DC

## 2022-12-18 MED ORDER — PROCHLORPERAZINE EDISYLATE 10 MG/2ML IJ SOLN: 10.0000 mg | INTRAMUSCULAR | Status: DC | PRN

## 2022-12-18 MED ORDER — METOPROLOL SUCCINATE ER 50 MG PO TB24
50.0000 mg | ORAL_TABLET | ORAL | Status: DC
  Administered 2022-12-20: 50 mg via ORAL
  Filled 2022-12-18: qty 1

## 2022-12-18 MED ORDER — CHLORHEXIDINE GLUCONATE CLOTH 2 % EX PADS
6.0000 | MEDICATED_PAD | Freq: Every day | CUTANEOUS | Status: DC
  Administered 2022-12-18 – 2022-12-20 (×3): 6 via TOPICAL

## 2022-12-18 MED ORDER — HYDRALAZINE HCL 20 MG/ML IJ SOLN
10.0000 mg | INTRAMUSCULAR | Status: DC | PRN
  Administered 2022-12-18: 10 mg via INTRAVENOUS
  Filled 2022-12-18: qty 1

## 2022-12-18 MED ORDER — AMLODIPINE BESYLATE 5 MG PO TABS
5.0000 mg | ORAL_TABLET | Freq: Every day | ORAL | Status: DC
  Administered 2022-12-18 – 2022-12-20 (×2): 5 mg via ORAL
  Filled 2022-12-18 (×2): qty 1

## 2022-12-18 MED ORDER — INSULIN ASPART 100 UNIT/ML IJ SOLN
0.0000 [IU] | Freq: Three times a day (TID) | INTRAMUSCULAR | Status: DC
  Administered 2022-12-18: 3 [IU] via SUBCUTANEOUS
  Administered 2022-12-18: 5 [IU] via SUBCUTANEOUS
  Administered 2022-12-19: 2 [IU] via SUBCUTANEOUS

## 2022-12-18 MED ORDER — INSULIN GLARGINE-YFGN 100 UNIT/ML ~~LOC~~ SOLN
18.0000 [IU] | Freq: Every day | SUBCUTANEOUS | Status: DC
  Administered 2022-12-18 – 2022-12-19 (×2): 18 [IU] via SUBCUTANEOUS
  Filled 2022-12-18 (×4): qty 0.18

## 2022-12-18 MED ORDER — INSULIN ASPART 100 UNIT/ML IJ SOLN
6.0000 [IU] | Freq: Three times a day (TID) | INTRAMUSCULAR | Status: DC
  Administered 2022-12-18 – 2022-12-19 (×2): 6 [IU] via SUBCUTANEOUS

## 2022-12-18 NOTE — Evaluation (Signed)
Occupational Therapy Evaluation Patient Details Name: Kathryn Beck MRN: [id number] DOB: Aug 17, 1955 Today's Date: 12/18/2022   History of Present Illness Kathryn Beck  is a 67 y.o. female,  with medical history significant of acid reflux, depression, ESRD on HD, hyperlipidemia, hypertension, diabetes mellitus, memory loss, with severe PVD, status post bilateral AKA, on aspirin and Plavix, Plavix has been stopped last month per husband at bedside .  -Presents status post fall, injury to the right eye, husband at bedside assists with the history, she is wheelchair dependent, she fell out of her chair yesterday when she was going from her wheelchair to a chair, she slipped, she fell, where right side of her face hit the ground, denies any other injuries, no loss of consciousness, she denies any dizziness, lightheadedness, husband at bedside patient has been more confused over the last few days, no fever, no chills, she received her full session of dialysis yesterday, with no complications.  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 Pt reported feeling at or near baseline. Mild weakness noted in L hand grasp compared to R. Generally weak otherwise with mild A/ROM deficits at the shoulder, which are likely baseline issues. Pt was able to scoot in bed with supervision and required min A for bed mobility. Family reported feeling able to assist pt at her current functional level at home. Pt is not recommended for further acute OT services and will be discharged to care of nursing staff for remaining length of stay.        If plan is discharge home, recommend the following: A little help with walking and/or transfers;A little  help with bathing/dressing/bathroom;Assistance with cooking/housework;Assist for transportation;Help with stairs or ramp for entrance    Functional Status Assessment  Patient has had a recent decline in their functional status and demonstrates the ability to make significant improvements in function in a reasonable and predictable amount of time.  Equipment Recommendations  None recommended by OT    Recommendations for Other Services       Precautions / Restrictions Precautions Precautions: Fall Restrictions Weight Bearing Restrictions: No      Mobility Bed Mobility Overal bed mobility: Needs Assistance Bed Mobility: Supine to Sit, Sit to Supine     Supine to sit: Min assist Sit to supine: Supervision   General bed mobility comments: Assist to sit up via two hand held assist.    Transfers                          Balance Overall balance assessment: Needs assistance Sitting-balance support: No upper extremity supported, Feet supported Sitting balance-Leahy Scale: Good Sitting balance - Comments: seated at EOB                                   ADL either performed or assessed with clinical judgement   ADL Overall ADL's : Needs assistance/impaired     Grooming: Set up;Sitting       Lower Body Bathing: Minimal assistance;Sitting/lateral leans   Upper Body Dressing : Set up;Sitting       Toilet Transfer: Supervision/safety;Minimal assistance Toilet Transfer Details (indicate cue type  and reason): Partially simulated via cooting on bed.                 Vision Baseline Vision/History: 1 Wears glasses Ability to See in Adequate Light: 1 Impaired Patient Visual Report: No change from baseline Vision Assessment?: Wears glasses for reading     Perception Perception: Not tested       Praxis Praxis: [facility]       Pertinent Vitals/Pain Pain Assessment Pain Assessment: Faces Faces Pain Scale: No hurt     Extremity/Trunk  Assessment Upper Extremity Assessment Upper Extremity Assessment: Generalized weakness;LUE deficits/detail LUE Deficits / Details: Mild weakness in L gross grasp compared to R. Possibly a baseline issue. LUE Sensation: WNL LUE Coordination: WNL   Lower Extremity Assessment Lower Extremity Assessment: Defer to PT evaluation       Communication Communication Communication: No apparent difficulties   Cognition Arousal: Alert Behavior During Therapy: WFL for tasks assessed/performed Overall Cognitive Status: Within Functional Limits for tasks assessed                                                        Home Living Family/patient expects to be discharged to:: Private residence Living Arrangements: Spouse/significant other Available Help at Discharge: Family;Available PRN/intermittently Type of Home: House Home Access: Stairs to enter [employer] of Steps: 2 Entrance Stairs-Rails: None Home Layout: Able to live on main level with bedroom/bathroom     Bathroom Shower/Tub: Tub/shower unit;Sponge bathes at baseline   [employer]: Standard Bathroom Accessibility: Yes How Accessible: Accessible via wheelchair Home Equipment: BSC/3in1;Wheelchair - Forensic psychologist (2 wheels);Cane - single point;Grab bars - tub/shower          Prior Functioning/Environment Prior Level of Function : Needs assist             Mobility Comments: Pt scoots from bed to w/c and bathroom. ADLs Comments: A little hlep for ADL's per pt report.                                Co-evaluation PT/OT/SLP Co-Evaluation/Treatment: Yes Reason for Co-Treatment: To address functional/ADL transfers   OT goals addressed during session: ADL's and self-care      AM-PAC OT "6 Clicks" Daily Activity     Outcome Measure Help from another person eating meals?: None Help from another person taking care of personal grooming?: A Little Help from another person  toileting, which includes using toliet, bedpan, or urinal?: A Little Help from another person bathing (including washing, rinsing, drying)?: A Little Help from another person to put on and taking off regular upper body clothing?: A Little Help from another person to put on and taking off regular lower body clothing?: A Little 6 Click Score: 19   End of Session    Activity Tolerance: Patient tolerated treatment well Patient left: in bed;with call bell/phone within reach;with family/visitor present  OT Visit Diagnosis: Muscle weakness (generalized) (M62.81);Other symptoms and signs involving the nervous system (I43.329)                Time: 5188-4166 OT Time Calculation (min): 10 min Charges:  OT General Charges $OT Visit: 1 Visit OT Evaluation $OT Eval Low Complexity: 1 Low  Haasini Patnaude OT, MOT   Danie Chandler 12/18/2022,  12:04 PM

## 2022-12-18 NOTE — Evaluation (Signed)
Physical Therapy Evaluation Patient Details Name: Kathryn Beck MRN: [id number] DOB: [birth year] Today's Date: 12/18/2022  History of Present Illness  Kathryn Beck  is a 67 y.o. female,  with medical history significant of acid reflux, depression, ESRD on HD, hyperlipidemia, hypertension, diabetes mellitus, memory loss, with severe PVD, status post bilateral AKA, on aspirin and Plavix, Plavix has been stopped last month per husband at bedside .  -Presents status post fall, injury to the right eye, husband at bedside assists with the history, she is wheelchair dependent, she fell out of her chair yesterday when she was going from her wheelchair to a chair, she slipped, she fell, where right side of her face hit the ground, denies any other injuries, no loss of consciousness, she denies any dizziness, lightheadedness, husband at bedside patient has been more confused over the last few days, no fever, no chills, she received her full session of dialysis yesterday, with no complications.  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 Patient tolerated long sitting in bed with her son present after therapy.  Patient will benefit from continued skilled physical therapy in hospital and recommended venue below to increase strength, balance, endurance for safe ADLs and gait.         If plan is discharge home, recommend the following: A lot of help with walking and/or transfers;A little help with bathing/dressing/bathroom;Help with stairs or  ramp for entrance;Assistance with cooking/housework   Can travel by private vehicle        Equipment Recommendations None recommended by PT  Recommendations for Other Services       Functional Status Assessment Patient has had a recent decline in their functional status and demonstrates the ability to make significant improvements in function in a reasonable and predictable amount of time.     Precautions / Restrictions Precautions Precautions: Fall Restrictions Weight Bearing Restrictions: No      Mobility  Bed Mobility Overal bed mobility: Needs Assistance Bed Mobility: Supine to Sit, Sit to Supine     Supine to sit: Min assist Sit to supine: Supervision   General bed mobility comments: fair/good return for propping up on elbows during supine to sitting with Min assist    Transfers                        Ambulation/Gait                  Stairs            Wheelchair Mobility     Tilt Bed    Modified Rankin (Stroke Patients Only)       Balance Overall balance assessment: Needs assistance Sitting-balance support: Feet supported, No upper extremity supported Sitting balance-Leahy Scale: Good Sitting balance - Comments: seated at EOB                                     Pertinent Vitals/Pain Pain Assessment Pain Assessment: Faces Faces Pain Scale: No  hurt    Home Living Family/patient expects to be discharged to:: Private residence Living Arrangements: Spouse/significant other Available Help at Discharge: Family;Available PRN/intermittently Type of Home: House Home Access: Stairs to enter Entrance Stairs-Rails: None Entrance Stairs-Number of Steps: 2   Home Layout: Able to live on main level with bedroom/bathroom Home Equipment: BSC/3in1;Wheelchair - Forensic psychologist (2 wheels);Cane - single point;Grab bars - tub/shower      Prior Function Prior Level of Function : Needs assist       Physical Assist  : Mobility (physical);ADLs (physical) Mobility (physical): Bed mobility;Transfers   Mobility Comments: Pt scoots from bed to w/c and bathroom. ADLs Comments: A little hlep for ADL's per pt report.     Extremity/Trunk Assessment   Upper Extremity Assessment Upper Extremity Assessment: Defer to OT evaluation    Lower Extremity Assessment Lower Extremity Assessment: Generalized weakness;RLE deficits/detail RLE Deficits / Details: grossly -3/5 RLE Sensation: decreased light touch RLE Coordination: decreased fine motor;decreased gross motor    Cervical / Trunk Assessment Cervical / Trunk Assessment: Normal  Communication   Communication Communication: No apparent difficulties  Cognition Arousal: Alert Behavior During Therapy: WFL for tasks assessed/performed Overall Cognitive Status: Within Functional Limits for tasks assessed                                          General Comments      Exercises     Assessment/Plan    PT Assessment Patient needs continued PT services  PT Problem List Decreased strength;Decreased activity tolerance;Decreased balance;Decreased mobility       PT Treatment Interventions DME instruction;Functional mobility training;Therapeutic activities;Therapeutic exercise;Balance training;Patient/family education    PT Goals (Current goals can be found in the Care Plan section)  Acute Rehab PT Goals Patient Stated Goal: return home with family to assist PT Goal Formulation: With patient/family Time For Goal Achievement: 12/21/22 Potential to Achieve Goals: Good    Frequency Min 3X/week     Co-evaluation PT/OT/SLP Co-Evaluation/Treatment: Yes Reason for Co-Treatment: To address functional/ADL transfers PT goals addressed during session: Mobility/safety with mobility;Balance         AM-PAC PT "6 Clicks" Mobility  Outcome Measure Help needed turning from your back to your side while in a flat bed without using bedrails?:  None Help needed moving from lying on your back to sitting on the side of a flat bed without using bedrails?: A Little Help needed moving to and from a bed to a chair (including a wheelchair)?: A Lot Help needed standing up from a chair using your arms (e.g., wheelchair or bedside chair)?: Total Help needed to walk in hospital room?: Total Help needed climbing 3-5 steps with a railing? : Total 6 Click Score: 12    End of Session   Activity Tolerance: Patient tolerated treatment well;Patient limited by fatigue Patient left: in bed;with call bell/phone within reach;with family/visitor present Nurse Communication: Mobility status PT Visit Diagnosis: Other abnormalities of gait and mobility (R26.89);Muscle weakness (generalized) (M62.81);History of falling (Z91.81)    Time: 4098-1191 PT Time Calculation (min) (ACUTE ONLY): 12 min   Charges:   PT Evaluation $PT Eval Low Complexity: 1 Low PT Treatments $Therapeutic Activity: 8-22 mins PT General Charges $$ ACUTE PT VISIT: 1 Visit         2:36 PM, 12/18/22 Ocie Bob, MPT Physical Therapist with [facility] 336 [phone] office [phone] mobile phone

## 2022-12-18 NOTE — Consult Note (Addendum)
WOC Nurse Consult Note: Reason for Consult: Consult requested for left BKA stump.  Performed remotely after review of progress notes and photos in the EMR.  Wound type: Left stump with patchy areas of full thickness wounds, one larger then the others, separated by narrow bridges of intact skin, yellow wound beds.  Dressing procedure/placement/frequency: Topical treatment orders provided for bedside nurses to perform as follows to assist with removal of nonviable tissue: Apply Medihoney to left BKA wounds Q day, then cover with foam dressing. Please re-consult if further assistance is needed.  Thank-you,  Cammie Mcgee MSN, RN, CWOCN, [place], CNS [phone]

## 2022-12-18 NOTE — Progress Notes (Addendum)
DME Needed - Hospital Bed  Patient requires frequent re-positioning of the body in ways that cannot be achieved with an ordinary bed or wedge pillow, to eliminate pain, reduce pressure, and the head of the  bed to be elevated more than 30 degrees  most of the time due to CVA, BKA right, Obesity.

## 2022-12-18 NOTE — Progress Notes (Signed)
*  PRELIMINARY RESULTS* Echocardiogram 2D Echocardiogram has been performed.  Laddie Aquas 12/18/2022, 2:57 PM

## 2022-12-18 NOTE — Hospital Course (Signed)
67 y.o. female,  with medical history significant of acid reflux, depression, ESRD on HD, hyperlipidemia, hypertension, diabetes mellitus, memory loss, with severe PVD, status post bilateral AKA, on aspirin and Plavix, Plavix has been stopped last month per husband at bedside due to adverse side effects.  Pt presented status post fall at home, injury to the right eye, husband at bedside assists with the history, she is wheelchair dependent, she fell out of her chair yesterday when she was going from her wheelchair to a chair, she slipped, she fell, where right side of her face hit the ground, denies any other injuries, no loss of consciousness, she denies any dizziness, lightheadedness, husband at bedside patient has been more confused over the last few days, no fever, no chills, she received her full session of dialysis yesterday, with no complications. 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

## 2022-12-18 NOTE — Progress Notes (Signed)
Patient wanted to retry swallow screen. Patient passed. MD Zierle-Ghosh notified. Received updated diet order. Cardiac order at this time.

## 2022-12-18 NOTE — Progress Notes (Addendum)
  Nephrology Nursing Note:  Per Dr. Signe Colt, today's HD treatment has been rescheduled for tomorrow due to high pt census.   Arman Filter, RN AP [facility]

## 2022-12-18 NOTE — Care Management CC44 (Signed)
Condition Code 44 Documentation Completed  Patient Details  Name: Kathryn Beck MRN: [id number] Date of Birth: Jun 29, 1955   Condition Code 44 given:  Yes Patient signature on Condition Code 44 notice:  Yes Documentation of 2 MD's agreement:  Yes Code 44 added to claim:  Yes    Leitha Bleak, RN 12/18/2022, 3:18 PM

## 2022-12-18 NOTE — Progress Notes (Signed)
SLP Cancellation Note  Patient Details Name: Kathryn Beck MRN: [id number] DOB: [birth year]   Cancelled treatment:       Reason Eval/Treat Not Completed: Other (comment) (Pt passed the [facility] swallow screen and diet has been ordered. 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 Pt currently with ultrasound.)  Thank you,  Havery Moros, CCC-SLP [phone]  Tyianna Menefee 12/18/2022, 8:13 AM

## 2022-12-18 NOTE — Progress Notes (Signed)
PROGRESS NOTE   Kathryn Beck  ZOX:[id number] DOB: [birth year] DOA: 12/17/2022 PCP: [name], MD   Chief Complaint  Patient presents with   Fall   Level of care: Telemetry  Brief Admission History:   67 y.o. female,  with medical history significant of acid reflux, depression, ESRD on HD, hyperlipidemia, hypertension, diabetes mellitus, memory loss, with severe PVD, status post bilateral AKA, on aspirin and Plavix, Plavix has been stopped last month per husband at bedside due to adverse side effects.  Pt presented status post fall at home, injury to the right eye, husband at bedside assists with the history, she is wheelchair dependent, she fell out of her chair yesterday when she was going from her wheelchair to a chair, she slipped, she fell, where right side of her face hit the ground, denies any other injuries, no loss of consciousness, she denies any dizziness, lightheadedness, husband at bedside patient has been more confused over the last few days, no fever, no chills, she received her full session of dialysis yesterday, with no complications. ED course: patient received Tdap vaccine for her right eye injury, CT head, cervical spine, maxillofacial without contrast was significant for subacute left parietal cortical infarct within the posterior left MCA territory, So [facility]ist consulted to admit   Assessment and Plan:  Fall at home - follow up PT/OT eval and recommendations - anticipate can discharge on 9/28 - continue fall precautions   Chronic CVA - MRI completed and consistent with chronic findings - reviewed with neuro - continue aspirin daily  - pt reports she had adverse side effects from clopidogrel and was stopped 1 mos ago  PVD  - stable on home aspirin zetia  Essential hypertension - suboptimally controlled - resumed home meds, added IV  hydralazine PRN  Hyperlipidemia - reports statin intolerance  - resumed on zetia   Hypokalemia -  repleted   Type 2 DM with nephropathy, vascular complications - follow up A1c - resume some prandial and bolus insulin - SSI coverage ordered - CBG monitoring 5x per day  ESRD on HD - appreciate nephrology consult for inpatient HD for today on regular schedule  DVT prophylaxis: SQ heparin  Code Status: full  Family Communication: husband bedside  Disposition: anticipate DC home tomorrow if stable     Antimicrobials:    Subjective: Pt reports that overall no specific complaints.  Objective: Vitals:   12/17/22 2120 12/18/22 0009 12/18/22 0358 12/18/22 1332  BP: (!) 174/77 (!) 177/82 (!) 156/78 (!) 180/82  Pulse: 70 72 72 77  Resp: 20 18 20 16   Temp: 97.6 F (36.4 C) 97.6 F (36.4 C) 98.2 F (36.8 C) 97.7 F (36.5 C)  TempSrc:  Oral  Oral  SpO2: 97% 100% 99% 96%  Weight:      Height:        Intake/Output Summary (Last 24 hours) at 12/18/2022 1347 Last data filed at 12/18/2022 1237 Gross per 24 hour  Intake 200 ml  Output --  Net 200 ml   Filed Weights   12/17/22 1341  Weight: 84.3 kg   Examination:  General exam: Appears calm and comfortable  Respiratory system: Clear to auscultation. Respiratory effort normal. Cardiovascular system: normal S1 & S2 heard. No JVD, murmurs, rubs, gallops or clicks. No pedal edema. Gastrointestinal system: Abdomen is nondistended, soft and nontender. No organomegaly or masses felt. Normal bowel sounds heard. Central nervous system: Alert and oriented. No focal neurological deficits. Extremities: bilateral BKA. Skin: No rashes, lesions  or ulcers. Psychiatry: Judgement and insight appear normal. Mood & affect appropriate.   Data Reviewed: I have personally reviewed following labs and imaging studies  CBC: Recent Labs  Lab 12/15/22 1340 12/17/22 1543  WBC 7.2 7.5  NEUTROABS 5.6 6.0  HGB 10.9* 11.1*  HCT 37.0 36.5  MCV 81 79.5*  PLT 354 309    Basic Metabolic Panel: Recent Labs  Lab 12/17/22 1543  NA 138  K 3.3*   CL 101  CO2 25  GLUCOSE 140*  BUN 34*  CREATININE 4.80*  CALCIUM 8.7*    CBG: Recent Labs  Lab 12/17/22 2237 12/18/22 0006 12/18/22 0354 12/18/22 0749 12/18/22 1126  GLUCAP 171* 185* 234* 197* 250*    No results found for this or any previous visit (from the past 240 hour(s)).   Radiology Studies: MR BRAIN WO CONTRAST  Result Date: 12/18/2022 CLINICAL DATA:  Initial evaluation for acute stroke, weakness, confusion. EXAM: MRI HEAD WITHOUT CONTRAST MRA HEAD WITHOUT CONTRAST TECHNIQUE: Multiplanar, multi-echo pulse sequences of the brain and surrounding structures were acquired without intravenous contrast. Angiographic images of the Circle of Willis were acquired using MRA technique without intravenous contrast. COMPARISON:  Prior CT from earlier the same day as well as previous MRI from 10/02/2021. FINDINGS: MRI HEAD FINDINGS Brain: Examination degraded by motion artifact. Cerebral volume within normal limits for age. Patchy T2/FLAIR hyperintensity involving the periventricular and deep white matter both cerebral hemispheres as well as the pons, consistent with chronic small vessel ischemic disease, mild in nature. Encephalomalacia and gliosis involving the left parieto-occipital region, consistent with a left MCA distribution infarct, corresponding with abnormality on prior CT. This is largely chronic in appearance by MRI, with no significant residual diffusion signal abnormality. Scattered susceptibility artifact within this region noted, which could be related to chronic blood products and/or laminar necrosis. Although this infarct is largely chronic in appearance, this is new as compared to prior MRI from 10/02/2021. No other evidence for acute or subacute ischemia. No other acute or chronic intracranial blood products. Gray-white matter differentiation otherwise maintained. No mass lesion or midline shift. No hydrocephalus or extra-axial fluid collection. Partially empty sella noted.  Vascular: Major intracranial vascular flow voids are maintained. Skull and upper cervical spine: Cranial junction with normal limits. Bone marrow signal intensity normal. No scalp soft tissue abnormality. Sinuses/Orbits: Mild exophthalmos. Paranasal sinuses are clear. Trace bilateral mastoid effusions, of doubtful significance. Negative nasopharynx. Other: None. MRA HEAD FINDINGS Anterior circulation: Examination degraded by motion artifact. Visualized distal cervical segments of the internal carotid arteries are patent with antegrade flow. Mild atheromatous irregularity about the carotid siphons without significant stenosis. A1 segments, anterior communicating artery complex, and anterior cerebral arteries patent. No M1 stenosis or occlusion. Distal MCA branches fairly perfused and symmetric. Posterior circulation: Both vertebral arteries are diminutive, with the right being slightly dominant. Right V4 segment patent to the vertebrobasilar junction. Evaluation for potential stenosis fairly limited by motion. Right PICA grossly patent. Hypoplastic left vertebral artery appears to terminate in PICA. Partially visualized left PICA patent as well. Basilar is markedly diminutive but grossly patent to its distal aspect. Superior cerebral arteries patent at their origins. Fetal type origin of the PCAs bilaterally. Both PCAs patent to their distal aspects without visible stenosis. Anatomic variants: Fetal type origin of the PCAs with overall diminutive vertebrobasilar system. No intracranial aneurysm. IMPRESSION: MRI HEAD IMPRESSION: 1. Motion degraded exam. 2. Chronic left MCA distribution infarct involving the left parieto-occipital region, corresponding with abnormality on prior CT. 3.  No other acute intracranial abnormality. 4. Underlying mild chronic microvascular ischemic disease. MRA HEAD IMPRESSION: 1. Motion degraded exam. 2. Negative intracranial MRA for large vessel occlusion. No other visible acute abnormality  on this motion degraded exam. 3. Fetal type origin of the PCAs with overall diminutive vertebrobasilar system. Electronically Signed   By: Rise Mu M.D.   On: 12/18/2022 00:08   MR ANGIO HEAD WO CONTRAST  Result Date: 12/18/2022 CLINICAL DATA:  Initial evaluation for acute stroke, weakness, confusion. EXAM: MRI HEAD WITHOUT CONTRAST MRA HEAD WITHOUT CONTRAST TECHNIQUE: Multiplanar, multi-echo pulse sequences of the brain and surrounding structures were acquired without intravenous contrast. Angiographic images of the Circle of Willis were acquired using MRA technique without intravenous contrast. COMPARISON:  Prior CT from earlier the same day as well as previous MRI from 10/02/2021. FINDINGS: MRI HEAD FINDINGS Brain: Examination degraded by motion artifact. Cerebral volume within normal limits for age. Patchy T2/FLAIR hyperintensity involving the periventricular and deep white matter both cerebral hemispheres as well as the pons, consistent with chronic small vessel ischemic disease, mild in nature. Encephalomalacia and gliosis involving the left parieto-occipital region, consistent with a left MCA distribution infarct, corresponding with abnormality on prior CT. This is largely chronic in appearance by MRI, with no significant residual diffusion signal abnormality. Scattered susceptibility artifact within this region noted, which could be related to chronic blood products and/or laminar necrosis. Although this infarct is largely chronic in appearance, this is new as compared to prior MRI from 10/02/2021. No other evidence for acute or subacute ischemia. No other acute or chronic intracranial blood products. Gray-white matter differentiation otherwise maintained. No mass lesion or midline shift. No hydrocephalus or extra-axial fluid collection. Partially empty sella noted. Vascular: Major intracranial vascular flow voids are maintained. Skull and upper cervical spine: Cranial junction with normal  limits. Bone marrow signal intensity normal. No scalp soft tissue abnormality. Sinuses/Orbits: Mild exophthalmos. Paranasal sinuses are clear. Trace bilateral mastoid effusions, of doubtful significance. Negative nasopharynx. Other: None. MRA HEAD FINDINGS Anterior circulation: Examination degraded by motion artifact. Visualized distal cervical segments of the internal carotid arteries are patent with antegrade flow. Mild atheromatous irregularity about the carotid siphons without significant stenosis. A1 segments, anterior communicating artery complex, and anterior cerebral arteries patent. No M1 stenosis or occlusion. Distal MCA branches fairly perfused and symmetric. Posterior circulation: Both vertebral arteries are diminutive, with the right being slightly dominant. Right V4 segment patent to the vertebrobasilar junction. Evaluation for potential stenosis fairly limited by motion. Right PICA grossly patent. Hypoplastic left vertebral artery appears to terminate in PICA. Partially visualized left PICA patent as well. Basilar is markedly diminutive but grossly patent to its distal aspect. Superior cerebral arteries patent at their origins. Fetal type origin of the PCAs bilaterally. Both PCAs patent to their distal aspects without visible stenosis. Anatomic variants: Fetal type origin of the PCAs with overall diminutive vertebrobasilar system. No intracranial aneurysm. IMPRESSION: MRI HEAD IMPRESSION: 1. Motion degraded exam. 2. Chronic left MCA distribution infarct involving the left parieto-occipital region, corresponding with abnormality on prior CT. 3. No other acute intracranial abnormality. 4. Underlying mild chronic microvascular ischemic disease. MRA HEAD IMPRESSION: 1. Motion degraded exam. 2. Negative intracranial MRA for large vessel occlusion. No other visible acute abnormality on this motion degraded exam. 3. Fetal type origin of the PCAs with overall diminutive vertebrobasilar system. Electronically  Signed   By: Rise Mu M.D.   On: 12/18/2022 00:08   CT Head Wo Contrast  Addendum Date: 12/17/2022  ADDENDUM REPORT: 12/17/2022 17:36 ADDENDUM: These results were called by telephone at the time of interpretation on 12/17/2022 at 5:36 pm to provider Beckey Downing, MD, who verbally acknowledged these results. Electronically Signed   By: Helyn Numbers M.D.   On: 12/17/2022 17:36   Result Date: 12/17/2022 CLINICAL DATA:  Head trauma, GCS=15, loss of consciousness (LOC) (Ped 0-17y); Neck trauma (Age >= 65y). Fall. EXAM: CT HEAD WITHOUT CONTRAST CT MAXILLOFACIAL WITHOUT CONTRAST CT CERVICAL SPINE WITHOUT CONTRAST TECHNIQUE: Multidetector CT imaging of the head, cervical spine, and maxillofacial structures were performed using the standard protocol without intravenous contrast. Multiplanar CT image reconstructions of the cervical spine and maxillofacial structures were also generated. RADIATION DOSE REDUCTION: This exam was performed according to the departmental dose-optimization program which includes automated exposure control, adjustment of the mA and/or kV according to patient size and/or use of iterative reconstruction technique. COMPARISON:  MRI 10/02/2021 FINDINGS: CT HEAD FINDINGS Brain: There is a left parietal subacute cortical infarct within the posterior left MCA territory. No superimposed acute intracranial hemorrhage or associated abnormal mass effect. No midline shift. Ventricular size is normal. Cerebellum is unremarkable. Vascular: No hyperdense vessel or unexpected calcification. Skull: Normal. Negative for fracture or focal lesion. Other: Mastoid air cells and middle ear cavities are clear CT MAXILLOFACIAL FINDINGS Osseous: No fracture or mandibular dislocation. No destructive process. Orbits: Negative. No traumatic or inflammatory finding. Sinuses: Clear. Soft tissues: Negative. CT CERVICAL SPINE FINDINGS Alignment: Mild cervical lordosis, likely positional.  No listhesis. Skull base  and vertebrae: Craniocervical alignment is normal. Landau dental dental interval is not widened. No acute fracture of the cervical spine. Vertebral body height is preserved. Soft tissues and spinal canal: No prevertebral fluid or swelling. No visible canal hematoma. Disc levels: Intervertebral disc heights are largely preserved. Minimal degenerative disc annular calcifications noted at C3-C6. Prevertebral soft tissues are not thickened on sagittal reformats. Spinal canal is widely patent. No significant neuroforaminal narrowing. Upper chest: Negative. Other: None IMPRESSION: 1. Subacute left parietal cortical infarct within the posterior left MCA territory. No superimposed acute intracranial hemorrhage or associated abnormal mass effect. 2. No acute facial bone fracture. 3. No acute fracture or listhesis of the cervical spine. Electronically Signed: By: Helyn Numbers M.D. On: 12/17/2022 17:31   CT Maxillofacial Wo Contrast  Addendum Date: 12/17/2022   ADDENDUM REPORT: 12/17/2022 17:36 ADDENDUM: These results were called by telephone at the time of interpretation on 12/17/2022 at 5:36 pm to provider Beckey Downing, MD, who verbally acknowledged these results. Electronically Signed   By: Helyn Numbers M.D.   On: 12/17/2022 17:36   Result Date: 12/17/2022 CLINICAL DATA:  Head trauma, GCS=15, loss of consciousness (LOC) (Ped 0-17y); Neck trauma (Age >= 65y). Fall. EXAM: CT HEAD WITHOUT CONTRAST CT MAXILLOFACIAL WITHOUT CONTRAST CT CERVICAL SPINE WITHOUT CONTRAST TECHNIQUE: Multidetector CT imaging of the head, cervical spine, and maxillofacial structures were performed using the standard protocol without intravenous contrast. Multiplanar CT image reconstructions of the cervical spine and maxillofacial structures were also generated. RADIATION DOSE REDUCTION: This exam was performed according to the departmental dose-optimization program which includes automated exposure control, adjustment of the mA and/or kV  according to patient size and/or use of iterative reconstruction technique. COMPARISON:  MRI 10/02/2021 FINDINGS: CT HEAD FINDINGS Brain: There is a left parietal subacute cortical infarct within the posterior left MCA territory. No superimposed acute intracranial hemorrhage or associated abnormal mass effect. No midline shift. Ventricular size is normal. Cerebellum is unremarkable. Vascular: No hyperdense vessel or unexpected calcification.  Skull: Normal. Negative for fracture or focal lesion. Other: Mastoid air cells and middle ear cavities are clear CT MAXILLOFACIAL FINDINGS Osseous: No fracture or mandibular dislocation. No destructive process. Orbits: Negative. No traumatic or inflammatory finding. Sinuses: Clear. Soft tissues: Negative. CT CERVICAL SPINE FINDINGS Alignment: Mild cervical lordosis, likely positional.  No listhesis. Skull base and vertebrae: Craniocervical alignment is normal. Landau dental dental interval is not widened. No acute fracture of the cervical spine. Vertebral body height is preserved. Soft tissues and spinal canal: No prevertebral fluid or swelling. No visible canal hematoma. Disc levels: Intervertebral disc heights are largely preserved. Minimal degenerative disc annular calcifications noted at C3-C6. Prevertebral soft tissues are not thickened on sagittal reformats. Spinal canal is widely patent. No significant neuroforaminal narrowing. Upper chest: Negative. Other: None IMPRESSION: 1. Subacute left parietal cortical infarct within the posterior left MCA territory. No superimposed acute intracranial hemorrhage or associated abnormal mass effect. 2. No acute facial bone fracture. 3. No acute fracture or listhesis of the cervical spine. Electronically Signed: By: Helyn Numbers M.D. On: 12/17/2022 17:31   CT Cervical Spine Wo Contrast  Addendum Date: 12/17/2022   ADDENDUM REPORT: 12/17/2022 17:36 ADDENDUM: These results were called by telephone at the time of interpretation on  12/17/2022 at 5:36 pm to provider Beckey Downing, MD, who verbally acknowledged these results. Electronically Signed   By: Helyn Numbers M.D.   On: 12/17/2022 17:36   Result Date: 12/17/2022 CLINICAL DATA:  Head trauma, GCS=15, loss of consciousness (LOC) (Ped 0-17y); Neck trauma (Age >= 65y). Fall. EXAM: CT HEAD WITHOUT CONTRAST CT MAXILLOFACIAL WITHOUT CONTRAST CT CERVICAL SPINE WITHOUT CONTRAST TECHNIQUE: Multidetector CT imaging of the head, cervical spine, and maxillofacial structures were performed using the standard protocol without intravenous contrast. Multiplanar CT image reconstructions of the cervical spine and maxillofacial structures were also generated. RADIATION DOSE REDUCTION: This exam was performed according to the departmental dose-optimization program which includes automated exposure control, adjustment of the mA and/or kV according to patient size and/or use of iterative reconstruction technique. COMPARISON:  MRI 10/02/2021 FINDINGS: CT HEAD FINDINGS Brain: There is a left parietal subacute cortical infarct within the posterior left MCA territory. No superimposed acute intracranial hemorrhage or associated abnormal mass effect. No midline shift. Ventricular size is normal. Cerebellum is unremarkable. Vascular: No hyperdense vessel or unexpected calcification. Skull: Normal. Negative for fracture or focal lesion. Other: Mastoid air cells and middle ear cavities are clear CT MAXILLOFACIAL FINDINGS Osseous: No fracture or mandibular dislocation. No destructive process. Orbits: Negative. No traumatic or inflammatory finding. Sinuses: Clear. Soft tissues: Negative. CT CERVICAL SPINE FINDINGS Alignment: Mild cervical lordosis, likely positional.  No listhesis. Skull base and vertebrae: Craniocervical alignment is normal. Landau dental dental interval is not widened. No acute fracture of the cervical spine. Vertebral body height is preserved. Soft tissues and spinal canal: No prevertebral fluid or  swelling. No visible canal hematoma. Disc levels: Intervertebral disc heights are largely preserved. Minimal degenerative disc annular calcifications noted at C3-C6. Prevertebral soft tissues are not thickened on sagittal reformats. Spinal canal is widely patent. No significant neuroforaminal narrowing. Upper chest: Negative. Other: None IMPRESSION: 1. Subacute left parietal cortical infarct within the posterior left MCA territory. No superimposed acute intracranial hemorrhage or associated abnormal mass effect. 2. No acute facial bone fracture. 3. No acute fracture or listhesis of the cervical spine. Electronically Signed: By: Helyn Numbers M.D. On: 12/17/2022 17:31   DG Chest Portable 1 View  Result Date: 12/17/2022 CLINICAL DATA:  Altered mental  status. EXAM: PORTABLE CHEST 1 VIEW COMPARISON:  11/18/2022 FINDINGS: Stable enlarged cardiac silhouette and tortuous, partially calcified thoracic aorta. Decreased patchy and linear density at the left lung base with a small left pleural effusion. Clear right lung. Thoracic spine degenerative changes. IMPRESSION: Decreased left basilar atelectasis and probable pneumonia with a small left pleural effusion. Electronically Signed   By: Beckie Salts M.D.   On: 12/17/2022 17:18    Scheduled Meds:  amLODipine  5 mg Oral Daily   aspirin  162 mg Oral Daily   Chlorhexidine Gluconate Cloth  6 each Topical Q0600   cinacalcet  90 mg Oral Once per day on Monday Wednesday Friday   DULoxetine  60 mg Oral Daily   ezetimibe  10 mg Oral Daily   heparin  5,000 Units Subcutaneous Q8H   influenza vaccine adjuvanted  0.5 mL Intramuscular Tomorrow-1000   insulin aspart  0-15 Units Subcutaneous TID WC   insulin aspart  6 Units Subcutaneous TID WC   insulin glargine-yfgn  18 Units Subcutaneous Daily   leptospermum manuka honey  1 Application Topical Daily   [START ON 12/19/2022] metoprolol succinate  50 mg Oral Once per day on Sunday Tuesday Thursday Saturday   sucroferric  oxyhydroxide  1,000 mg Oral BID WC   sucroferric oxyhydroxide  500 mg Oral With snacks   torsemide  100 mg Oral BID   Continuous Infusions:   LOS: 1 day   Time spent: 44 mins  Normalee Sistare Laural Benes, MD How to contact the [facility] Attending or Consulting provider 7A - 7P or covering provider during after hours 7P -7A, for this patient?  Check the care team in [facility] and look for a) attending/consulting [facility] provider listed and b) the [facility] team listed Log into [email or web address] and use [facility] universal password to access. If you do not have the password, please contact the hospital operator. Locate the [facility] provider you are looking for under [facility] and page to a number that you can be directly reached. If you still have difficulty reaching the provider, please page the [facility] (Director on Call) for the Hospitalists listed on amion for assistance.  12/18/2022, 1:47 PM

## 2022-12-18 NOTE — Plan of Care (Signed)
  Problem: Education: Goal: Knowledge of the prescribed therapeutic regimen will improve Outcome: Progressing Goal: Ability to verbalize activity precautions or restrictions will improve Outcome: Progressing Goal: Understanding of discharge needs will improve Outcome: Progressing   

## 2022-12-18 NOTE — Telephone Encounter (Signed)
Can be added on Oct 15 at 920 or 220. Let me know if either will work and either myself or someone in clinic can add on to schedule. Thanks

## 2022-12-18 NOTE — Care Management Obs Status (Signed)
MEDICARE OBSERVATION STATUS NOTIFICATION   Patient Details  Name: Kathryn Beck MRN: [id number] Date of Birth: 04/05/1955   Medicare Observation Status Notification Given:  Yes    Leitha Bleak, RN 12/18/2022, 2:56 PM

## 2022-12-18 NOTE — TOC Initial Note (Addendum)
Transition of Care [facility]) - Initial/Assessment Note    Patient Details  Name: Kathryn Beck MRN: [id number] Date of Birth: 25-Mar-1955  Transition of Care [facility]) CM/SW Contact:    Leitha Bleak, RN Phone Number: 12/18/2022, 3:15 PM  Clinical Narrative:    Admitted with CVA.  Recently d/c from [facility] rehab. Active with [facility] for HHPT/aide. MD updated. Has hoyer lift, wheelchair, adjustable bed, bsc, and sliding board. Patient is requesting a hospital bed. MD aware to order. Referral sent to [facility] with [employer]. TOC will following.       Expected Discharge Plan: Home w Home Health Services Barriers to Discharge: Continued Medical Work up   Patient Goals and CMS Choice Patient states their goals for this hospitalization and ongoing recovery are:: to return home CMS Medicare.gov Compare Post Acute Care list provided to:: Patient Choice offered to / list presented to : Patient      Expected Discharge Plan and Services       Living arrangements for the past 2 months: Single Family Home                 DME Arranged: Hospital bed DME Agency: [employer] Date DME Agency Contacted: 12/18/22 Time DME Agency Contacted: 1515 Representative spoke with at DME Agency: Elna Breslow            Prior Living Arrangements/Services Living arrangements for the past 2 months: Single Family Home Lives with:: Spouse              Current home services: DME, Homehealth aide, Home PT    Activities of Daily Living   ADL Screening (condition at time of admission) Does the patient have a NEW difficulty with bathing/dressing/toileting/self-feeding that is expected to last >3 days?: No Does the patient have a NEW difficulty with getting in/out of bed, walking, or climbing stairs that is expected to last >3 days?: No Does the patient have a NEW difficulty with communication that is expected to last >3 days?: No Is the patient deaf or have difficulty hearing?: No Does the patient have difficulty  seeing, even when wearing glasses/contacts?: No Does the patient have difficulty concentrating, remembering, or making decisions?: Yes  Permission Sought/Granted                  Emotional Assessment   Attitude/Demeanor/Rapport: Engaged Affect (typically observed): Accepting Orientation: : Oriented to Self, Oriented to Place, Oriented to  Time, Oriented to Situation      Admission diagnosis:  CVA (cerebral vascular accident) [facility]) [I63.9] Cerebrovascular accident (CVA), unspecified mechanism (HCC) [I63.9] Fall at home [phone].Lorne Skeens, Y92.009] Patient Active Problem List   Diagnosis Date Noted   Fall at home 12/18/2022   CVA (cerebral vascular accident) (HCC) 12/17/2022   Acute confusion 12/10/2022   Poor appetite 12/10/2022   Chills 12/10/2022   Generalized weakness 11/18/2022   Paronychia of finger of right hand 11/13/2022   Dehiscence of amputation stump of left lower extremity (HCC) 10/07/2022   Status post below-knee amputation of left lower extremity (HCC) 10/07/2022   Subacute osteomyelitis, left ankle and foot (HCC) 09/03/2022   Left foot infection 09/03/2022   Amputated left leg (HCC) 09/03/2022   Wound infection after surgery 09/03/2022   Equinus contracture of left ankle 08/15/2022   Acute osteomyelitis of toe of left foot (HCC) 08/13/2022   Peripheral vascular complication 07/21/2022   PVD (peripheral vascular disease) (HCC) 07/18/2022   Toe ulcer, left, limited to breakdown of skin (HCC) 07/13/2022   S/P BKA (below  knee amputation), right (HCC) 05/29/2022   Confusion 05/19/2022   Visual hallucinations 05/19/2022   Watery eyes 05/19/2022   Requires assistance with activities of daily living (ADL) 04/26/2022   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   Gangrene of right foot (HCC) 04/25/2022   PAD (peripheral artery disease) (HCC) 04/25/2022   Other osteoporosis without current pathological fracture 11/10/2021   Hypocalcemia 11/10/2021   Fall  11/02/2021   Left foot pain 10/31/2021   Low back pain with left-sided sciatica 10/31/2021   Left ear impacted cerumen 09/18/2021   Chronic left shoulder pain 06/10/2021   Abnormal CXR 03/10/2021   Ear pain, bilateral 03/10/2021   Morbid obesity (HCC) 03/10/2021   Subungual hematoma of second toe of left foot 10/28/2020   Insomnia 10/28/2020   Memory loss or impairment 09/26/2020   Forgetfulness 09/26/2020   Recurrent vertigo 09/26/2020   Abnormal MRI, cervical spine 03/04/2020   Right leg weakness 02/28/2020   Knee pain, right 10/23/2019   Cough 08/09/2019   Carpal tunnel syndrome 06/13/2019   Chronic pain syndrome 06/13/2019   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) 06/13/2019   Neck pain 03/23/2019   Near syncope 12/05/2018   ESRD on hemodialysis (HCC) 11/22/2018   Ischemic heart disease 09/25/2018   Not currently working due to disabled status 06/13/2018   HTN (hypertension) 06/12/2018   Depression 03/09/2018   Adrenal mass, left (HCC) 11/09/2017   MGUS (monoclonal gammopathy of unknown significance) 11/05/2017   Shoulder pain 11/01/2017   Chronic diastolic (congestive) heart failure (HCC) 09/20/2017   Bilateral leg weakness 09/09/2017   Adrenal adenoma 09/03/2017   Uncontrolled type 2 diabetes mellitus 08/18/2017   Unsteady gait 07/11/2017   Recurrent falls 07/11/2017   Anemia of chronic disease 03/24/2017   Personal history of colonic polyps 02/10/2017   Dysphagia 11/05/2016   Irritable bowel syndrome 02/04/2016   Hospital discharge follow-up 10/27/2015   Intolerant of heat 10/07/2014   Cardiac murmur 01/17/2014   Back pain with left-sided radiculopathy 09/18/2013   Seasonal allergies 03/10/2013   Lipoma of back 12/22/2012   Other seasonal allergic rhinitis 08/22/2012   OSA (obstructive sleep apnea) 06/02/2011   Chronic kidney disease, stage 4 (severe) (HCC) 01/13/2011   Neuropathy 04/16/2010   Malaise and fatigue 07/24/2009   Personal  history of other diseases of the nervous system and sense organs 05/08/2009   LUPUS ERYTHEMATOSUS, DISCOID 06/05/2008   Arm pain 05/30/2008   Hyperlipidemia 06/07/2007   Obesity 06/07/2007   Malignant hypertension 06/07/2007   Type 2 diabetes mellitus with hyperglycemia (HCC) 06/07/2007   Diabetes mellitus (HCC) 06/07/2007   PCP:  [name], MD Pharmacy:   [facility] - [place], [place] - [address] [place] [address] Phone: [phone] Fax: [phone]     Social Determinants of Health (SDOH) Social History: SDOH Screenings   Food Insecurity: No Food Insecurity (12/18/2022)  Housing: Low Risk  (12/18/2022)  Transportation Needs: No Transportation Needs (12/18/2022)  Utilities: Not At Risk (12/18/2022)  Alcohol Screen: Low Risk  (05/12/2022)  Depression (PHQ2-9): Medium Risk (11/12/2022)  Financial Resource Strain: Medium Risk (05/12/2022)  Physical Activity: Inactive (05/12/2022)  Social Connections: Socially Integrated (05/12/2022)  Stress: No Stress Concern Present (05/12/2022)  Tobacco Use: Low Risk  (12/17/2022)   SDOH Interventions:     Readmission Risk Interventions    12/18/2022    3:14 PM 05/01/2022    3:54 PM  Readmission Risk Prevention Plan  Transportation  Screening Complete Complete  HRI or Home Care Consult  Complete  Social Work Consult for Recovery Care Planning/Counseling  --  Palliative Care Screening  Not Applicable  Medication Review [employer]) Complete Complete  PCP or Specialist appointment within 3-5 days of discharge Not Complete   HRI or Home Care Consult Complete   SW Recovery Care/Counseling Consult Complete   Palliative Care Screening Not Applicable   Skilled Nursing Facility Not Applicable

## 2022-12-18 NOTE — Consult Note (Addendum)
Reason for Consult: To manage dialysis and dialysis related needs Referring Physician: Dr Truett Perna is an 67 y.o. female.  HPI: Pt is a 88F with ESRD on HD MWF, HTN, HLD, DM II, and h/o CVA who presents to [facility] with a fall.  Pt was trying to transfer from [facility] to commode, lost her balance, and fell.  She hit her face.  No LOC.  Came to [facility] where CT head, cervical spine, maxillofacial without contrast was significant for subacute left parietal cortical infarct within the posterior left MCA territory.  She failed bedside swallow screen initally, then passed it in the early hours of the AM.  Not much to say this AM- no complaints, doesn't feel bad.  Son at bedside.    Dialysis Orders: Center:  MWF at [facility] 3:45hr, 400/500, EDW 80g, 2K/2.5Ca, UFP#2, LUE AVF 15g, heparin 1000 unit initial + 1000 unit/hr Meds: Mircera 60 mcg every 2 weeks, Venofer 50 mg once weekly, calcitriol 3 mcg every treatment, Sensipar 90 mg every treatment  Past Medical History:  Diagnosis Date   Acid reflux    Amputated left leg (HCC)    Anemia    Arthritis    Axillary masses    Soft tissue - status post excision   Back pain    Cancer (HCC)    CHF (congestive heart failure) (HCC)    COVID-19 virus infection 04/06/2019   Depression    End-stage renal disease (HCC)    M/W/F dialysis   Essential hypertension    Headache    years ago   History of blood transfusion    History of cardiac catheterization    Normal coronary arteries October 2020   History of claustrophobia    History of pneumonia 2019   Hypoxia 04/03/2019   Memory loss    Mixed hyperlipidemia    Obesity    Pancreatitis    Peritoneal dialysis catheter in place [facility])    Pneumonia due to COVID-19 virus 04/02/2019   Sleep apnea    Noncompliant with CPAP   Stroke (HCC)    mini stroke   Type 2 diabetes mellitus (HCC)     Past Surgical History:  Procedure Laterality Date   ABDOMINAL AORTOGRAM W/LOWER EXTREMITY N/A 04/30/2022    Procedure: ABDOMINAL AORTOGRAM W/LOWER EXTREMITY;  Surgeon: Leonie Douglas, MD;  [facility]: [facility] IN[facility] LAB;  Service: Cardiovascular;  Laterality: N/A;   ABDOMINAL AORTOGRAM W/LOWER EXTREMITY N/A 07/21/2022   Procedure: ABDOMINAL AORTOGRAM W/LOWER EXTREMITY;  Surgeon: Nada Libman, MD;  Location: [facility] CV [facility];  Service: Cardiovascular;  Laterality: N/A;   ABDOMINAL HYSTERECTOMY     ACHILLES TENDON LENGTHENING  08/15/2022   Procedure: ACHILLES TENDON LENGTHENING;  Surgeon: [name], DPM;  Location: [facility] OR;  Service: Podiatry;;   AMPUTATION Right 05/29/2022   Procedure: RIGHT BELOW THE KNEE AMPUTATION;  Surgeon: [name], MD;  Location: [facility] OR;  Service: Orthopedics;  Laterality: Right;   AMPUTATION Left 09/04/2022   Procedure: AMPUTATION FOOT, serial irrigation;  Surgeon: Louann Sjogren, DPM;  Location: [facility] OR;  Service: Podiatry;  Laterality: Left;  Surgical team to do block   AMPUTATION Left 10/07/2022   Procedure: LEFT BELOW KNEE AMPUTATION;  Surgeon: [name], MD;  Location: [facility] OR;  Service: Orthopedics;  Laterality: Left;   AV FISTULA PLACEMENT Left 09/02/2017   Procedure: creation of left arm ARTERIOVENOUS (AV) FISTULA;  Surgeon: Nada Libman, MD;  Location: [facility] OR;  Service: Vascular;  Laterality: Left;   COLONOSCOPY  2008   Dr. Darrick Penna: normal    COLONOSCOPY N/A 12/18/2016   Dr. Darrick Penna: multiple tubular adenomas, internal hemorrhoids. Surveillance in 3 years    ESOPHAGEAL DILATION N/A 10/13/2015   Procedure: ESOPHAGEAL DILATION;  Surgeon: Malissa Hippo, MD;  [facility];  Service: Endoscopy;  Laterality: N/A;   ESOPHAGOGASTRODUODENOSCOPY N/A 10/13/2015   Dr. Karilyn Cota: chronic gastritis on path, no H.pylori. Empiric dilation    ESOPHAGOGASTRODUODENOSCOPY N/A 12/18/2016   Dr. Darrick Penna: mild gastritis. BRAVO study revealed uncontrolled GERD. Dysphagia secondary to uncontrolled reflux   FOOT SURGERY Bilateral    "nerve"     LEFT HEART CATH AND  CORONARY ANGIOGRAPHY N/A 12/29/2018   Procedure: LEFT HEART CATH AND CORONARY ANGIOGRAPHY;  Surgeon: Corky Crafts, MD;  Location: [facility] INVASIVE CV LAB;  Service: Cardiovascular;  Laterality: N/A;   LOWER EXTREMITY ANGIOGRAPHY Right 05/04/2022   Procedure: Lower Extremity Angiography;  Surgeon: Victorino Sparrow, MD;  Location: [facility] INVASIVE CV LAB;  Service: Cardiovascular;  Laterality: Right;   LUNG BIOPSY     MASS EXCISION Right 01/09/2013   Procedure: EXCISION OF NEOPLASM OF RIGHT  AXILLA  AND EXCISION OF NEOPLASM OF LEFT AXILLA;  Surgeon: Dalia Heading, MD;  Location: [facility] ORS;  Service: General;  Laterality: Right;  procedure end @ 08:23   MYRINGOTOMY WITH TUBE PLACEMENT Bilateral 04/28/2017   Procedure: BILATERAL MYRINGOTOMY WITH TUBE PLACEMENT;  Surgeon: Newman Pies, MD;  Location: [facility] OR;  Service: ENT;  Laterality: Bilateral;   PERIPHERAL VASCULAR BALLOON ANGIOPLASTY Right 05/04/2022   Procedure: PERIPHERAL VASCULAR BALLOON ANGIOPLASTY;  Surgeon: Victorino Sparrow, MD;  Location: [facility] INVASIVE CV LAB;  Service: Cardiovascular;  Laterality: Right;  PT   PERIPHERAL VASCULAR INTERVENTION Right 05/04/2022   Procedure: PERIPHERAL VASCULAR INTERVENTION;  Surgeon: Victorino Sparrow, MD;  Location: [facility] INVASIVE CV LAB;  Service: Cardiovascular;  Laterality: Right;  SFA   PERIPHERAL VASCULAR INTERVENTION Left 07/21/2022   Procedure: PERIPHERAL VASCULAR INTERVENTION;  Surgeon: Nada Libman, MD;  [facility];  Service: Cardiovascular;  Laterality: Left;   REVISION OF ARTERIOVENOUS GORETEX GRAFT Left 05/04/2018   Procedure: TRANSPOSITION OF CEPHALIC VEIN ARTERIOVENOUS FISTULA LEFT ARM;  Surgeon: Larina Earthly, MD;  Location: [facility] OR;  Service: Vascular;  Laterality: Left;   SAVORY DILATION N/A 12/18/2016   Procedure: SAVORY DILATION;  Surgeon: West Bali, MD;  [facility];  Service: Endoscopy;  Laterality: N/A;   TRANSMETATARSAL AMPUTATION Left 08/15/2022   Procedure:  TRANSMETATARSAL AMPUTATION;  Surgeon: [name], DPM;  Location: [facility] OR;  Service: Podiatry;  Laterality: Left;    Family History  Problem Relation Age of Onset   Hypertension Father    Hypercholesterolemia Father    Arthritis Father    Hypertension Sister    Hypercholesterolemia Sister    Breast cancer Sister    Hypertension Sister    Colon cancer Neg Hx    Colon polyps Neg Hx     Social History:  reports that she has never smoked. She has never been exposed to tobacco smoke. She has never used smokeless tobacco. She reports that she does not drink alcohol and does not use drugs.  Allergies:  Allergies  Allergen Reactions   Ace Inhibitors Anaphylaxis and Swelling   Penicillins Itching and Swelling   Statins Other (See Comments)    elevated LFT's     Albuterol Swelling    Medications: Scheduled:  amLODipine  5 mg Oral  Daily   aspirin  162 mg Oral Daily   Chlorhexidine Gluconate Cloth  6 each Topical Q0600   cinacalcet  90 mg Oral Once per day on Monday Wednesday Friday   DULoxetine  60 mg Oral Daily   ezetimibe  10 mg Oral Daily   heparin  5,000 Units Subcutaneous Q8H   influenza vaccine adjuvanted  0.5 mL Intramuscular Tomorrow-1000   insulin aspart  0-15 Units Subcutaneous TID WC   insulin aspart  6 Units Subcutaneous TID WC   leptospermum manuka honey  1 Application Topical Daily   [START ON 12/19/2022] metoprolol succinate  50 mg Oral Once per day on Sunday Tuesday Thursday Saturday   sucroferric oxyhydroxide  1,000 mg Oral BID WC   sucroferric oxyhydroxide  500 mg Oral With snacks   torsemide  100 mg Oral BID    Results for orders placed or performed during the hospital encounter of 12/17/22 (from the past 48 hour(s))  CBC with Differential     Status: Abnormal   Collection Time: 12/17/22  3:43 PM  Result Value Ref Range   WBC 7.5 4.0 - 10.5 K/uL   RBC 4.59 3.87 - 5.11 MIL/uL   Hemoglobin 11.1 (L) 12.0 - 15.0 g/dL   HCT 16.1 09.6 - 04.5 %   MCV 79.5  (L) 80.0 - 100.0 fL   MCH 24.2 (L) 26.0 - 34.0 pg   MCHC 30.4 30.0 - 36.0 g/dL   RDW 40.9 (H) 81.1 - 91.4 %   Platelets 309 150 - 400 K/uL   nRBC 1.1 (H) 0.0 - 0.2 %   Neutrophils Relative % 80 %   Neutro Abs 6.0 1.7 - 7.7 K/uL   Lymphocytes Relative 11 %   Lymphs Abs 0.8 0.7 - 4.0 K/uL   Monocytes Relative 8 %   Monocytes Absolute 0.6 0.1 - 1.0 K/uL   Eosinophils Relative 0 %   Eosinophils Absolute 0.0 0.0 - 0.5 K/uL   Basophils Relative 0 %   Basophils Absolute 0.0 0.0 - 0.1 K/uL   Immature Granulocytes 1 %   Abs Immature Granulocytes 0.05 0.00 - 0.07 K/uL    Comment: Performed at [facility], [address] [place], [place] [address]  Comprehensive metabolic panel     Status: Abnormal   Collection Time: 12/17/22  3:43 PM  Result Value Ref Range   Sodium 138 135 - 145 mmol/L   Potassium 3.3 (L) 3.5 - 5.1 mmol/L   Chloride 101 98 - 111 mmol/L   CO2 25 22 - 32 mmol/L   Glucose, Bld 140 (H) 70 - 99 mg/dL    Comment: Glucose reference range applies only to samples taken after fasting for at least 8 hours.   BUN 34 (H) 8 - 23 mg/dL   Creatinine, Ser 6.21 (H) 0.44 - 1.00 mg/dL   Calcium 8.7 (L) 8.9 - 10.3 mg/dL   Total Protein 6.0 (L) 6.5 - 8.1 g/dL   Albumin 3.2 (L) 3.5 - 5.0 g/dL   AST 17 15 - 41 U/L   ALT 45 (H) 0 - 44 U/L   Alkaline Phosphatase 96 38 - 126 U/L   Total Bilirubin 0.7 0.3 - 1.2 mg/dL   GFR, Estimated 9 (L) >60 mL/min    Comment: (NOTE) Calculated using the CKD-EPI Creatinine Equation (2021)    Anion gap 12 5 - 15    Comment: Performed at [facility], [address], [place], [place] [address]  Hepatitis B surface antigen  Status: None   Collection Time: 12/17/22  7:19 PM  Result Value Ref Range   Hepatitis B Surface Ag NON REACTIVE NON REACTIVE    Comment: Performed at [facility], [address], [place], [place] [address]  Glucose, capillary     Status: Abnormal   Collection Time: 12/17/22 10:37 PM  Result Value Ref Range    Glucose-Capillary 171 (H) 70 - 99 mg/dL    Comment: Glucose reference range applies only to samples taken after fasting for at least 8 hours.  Glucose, capillary     Status: Abnormal   Collection Time: 12/18/22 12:06 AM  Result Value Ref Range   Glucose-Capillary 185 (H) 70 - 99 mg/dL    Comment: Glucose reference range applies only to samples taken after fasting for at least 8 hours.  Glucose, capillary     Status: Abnormal   Collection Time: 12/18/22  3:54 AM  Result Value Ref Range   Glucose-Capillary 234 (H) 70 - 99 mg/dL    Comment: Glucose reference range applies only to samples taken after fasting for at least 8 hours.  Lipid panel     Status: None   Collection Time: 12/18/22  4:13 AM  Result Value Ref Range   Cholesterol 123 0 - 200 mg/dL   Triglycerides 604 <540 mg/dL   HDL 41 >98 mg/dL   Total CHOL/HDL Ratio 3.0 RATIO   VLDL 21 0 - 40 mg/dL   LDL Cholesterol 61 0 - 99 mg/dL    Comment:        Total Cholesterol/HDL:CHD Risk Coronary Heart Disease Risk Table                     Men   Women  1/2 Average Risk   3.4   3.3  Average Risk       5.0   4.4  2 X Average Risk   9.6   7.1  3 X Average Risk  23.4   11.0        Use the calculated Patient Ratio above and the CHD Risk Table to determine the patient's CHD Risk.        ATP III CLASSIFICATION (LDL):  <100     mg/dL   Optimal  119-147  mg/dL   Near or Above                    Optimal  130-159  mg/dL   Borderline  829-562  mg/dL   High  >130     mg/dL   Very High Performed at [facility], [address] [place], [place] [address]   Glucose, capillary     Status: Abnormal   Collection Time: 12/18/22  7:49 AM  Result Value Ref Range   Glucose-Capillary 197 (H) 70 - 99 mg/dL    Comment: Glucose reference range applies only to samples taken after fasting for at least 8 hours.   *Note: Due to a large number of results and/or encounters for the requested time period, some results have not been displayed. A complete  set of results can be found in Results Review.    MR BRAIN WO CONTRAST  Result Date: 12/18/2022 CLINICAL DATA:  Initial evaluation for acute stroke, weakness, confusion. EXAM: MRI HEAD WITHOUT CONTRAST MRA HEAD WITHOUT CONTRAST TECHNIQUE: Multiplanar, multi-echo pulse sequences of the brain and surrounding structures were acquired without intravenous contrast. Angiographic images of the Circle of Willis were acquired using MRA technique without intravenous  contrast. COMPARISON:  Prior CT from earlier the same day as well as previous MRI from 10/02/2021. FINDINGS: MRI HEAD FINDINGS Brain: Examination degraded by motion artifact. Cerebral volume within normal limits for age. Patchy T2/FLAIR hyperintensity involving the periventricular and deep white matter both cerebral hemispheres as well as the pons, consistent with chronic small vessel ischemic disease, mild in nature. Encephalomalacia and gliosis involving the left parieto-occipital region, consistent with a left MCA distribution infarct, corresponding with abnormality on prior CT. This is largely chronic in appearance by MRI, with no significant residual diffusion signal abnormality. Scattered susceptibility artifact within this region noted, which could be related to chronic blood products and/or laminar necrosis. Although this infarct is largely chronic in appearance, this is new as compared to prior MRI from 10/02/2021. No other evidence for acute or subacute ischemia. No other acute or chronic intracranial blood products. Gray-white matter differentiation otherwise maintained. No mass lesion or midline shift. No hydrocephalus or extra-axial fluid collection. Partially empty sella noted. Vascular: Major intracranial vascular flow voids are maintained. Skull and upper cervical spine: Cranial junction with normal limits. Bone marrow signal intensity normal. No scalp soft tissue abnormality. Sinuses/Orbits: Mild exophthalmos. Paranasal sinuses are clear.  Trace bilateral mastoid effusions, of doubtful significance. Negative nasopharynx. Other: None. MRA HEAD FINDINGS Anterior circulation: Examination degraded by motion artifact. Visualized distal cervical segments of the internal carotid arteries are patent with antegrade flow. Mild atheromatous irregularity about the carotid siphons without significant stenosis. A1 segments, anterior communicating artery complex, and anterior cerebral arteries patent. No M1 stenosis or occlusion. Distal MCA branches fairly perfused and symmetric. Posterior circulation: Both vertebral arteries are diminutive, with the right being slightly dominant. Right V4 segment patent to the vertebrobasilar junction. Evaluation for potential stenosis fairly limited by motion. Right PICA grossly patent. Hypoplastic left vertebral artery appears to terminate in PICA. Partially visualized left PICA patent as well. Basilar is markedly diminutive but grossly patent to its distal aspect. Superior cerebral arteries patent at their origins. Fetal type origin of the PCAs bilaterally. Both PCAs patent to their distal aspects without visible stenosis. Anatomic variants: Fetal type origin of the PCAs with overall diminutive vertebrobasilar system. No intracranial aneurysm. IMPRESSION: MRI HEAD IMPRESSION: 1. Motion degraded exam. 2. Chronic left MCA distribution infarct involving the left parieto-occipital region, corresponding with abnormality on prior CT. 3. No other acute intracranial abnormality. 4. Underlying mild chronic microvascular ischemic disease. MRA HEAD IMPRESSION: 1. Motion degraded exam. 2. Negative intracranial MRA for large vessel occlusion. No other visible acute abnormality on this motion degraded exam. 3. Fetal type origin of the PCAs with overall diminutive vertebrobasilar system. Electronically Signed   By: Rise Mu M.D.   On: 12/18/2022 00:08   MR ANGIO HEAD WO CONTRAST  Result Date: 12/18/2022 CLINICAL DATA:  Initial  evaluation for acute stroke, weakness, confusion. EXAM: MRI HEAD WITHOUT CONTRAST MRA HEAD WITHOUT CONTRAST TECHNIQUE: Multiplanar, multi-echo pulse sequences of the brain and surrounding structures were acquired without intravenous contrast. Angiographic images of the Circle of Willis were acquired using MRA technique without intravenous contrast. COMPARISON:  Prior CT from earlier the same day as well as previous MRI from 10/02/2021. FINDINGS: MRI HEAD FINDINGS Brain: Examination degraded by motion artifact. Cerebral volume within normal limits for age. Patchy T2/FLAIR hyperintensity involving the periventricular and deep white matter both cerebral hemispheres as well as the pons, consistent with chronic small vessel ischemic disease, mild in nature. Encephalomalacia and gliosis involving the left parieto-occipital region, consistent with a left MCA  distribution infarct, corresponding with abnormality on prior CT. This is largely chronic in appearance by MRI, with no significant residual diffusion signal abnormality. Scattered susceptibility artifact within this region noted, which could be related to chronic blood products and/or laminar necrosis. Although this infarct is largely chronic in appearance, this is new as compared to prior MRI from 10/02/2021. No other evidence for acute or subacute ischemia. No other acute or chronic intracranial blood products. Gray-white matter differentiation otherwise maintained. No mass lesion or midline shift. No hydrocephalus or extra-axial fluid collection. Partially empty sella noted. Vascular: Major intracranial vascular flow voids are maintained. Skull and upper cervical spine: Cranial junction with normal limits. Bone marrow signal intensity normal. No scalp soft tissue abnormality. Sinuses/Orbits: Mild exophthalmos. Paranasal sinuses are clear. Trace bilateral mastoid effusions, of doubtful significance. Negative nasopharynx. Other: None. MRA HEAD FINDINGS Anterior  circulation: Examination degraded by motion artifact. Visualized distal cervical segments of the internal carotid arteries are patent with antegrade flow. Mild atheromatous irregularity about the carotid siphons without significant stenosis. A1 segments, anterior communicating artery complex, and anterior cerebral arteries patent. No M1 stenosis or occlusion. Distal MCA branches fairly perfused and symmetric. Posterior circulation: Both vertebral arteries are diminutive, with the right being slightly dominant. Right V4 segment patent to the vertebrobasilar junction. Evaluation for potential stenosis fairly limited by motion. Right PICA grossly patent. Hypoplastic left vertebral artery appears to terminate in PICA. Partially visualized left PICA patent as well. Basilar is markedly diminutive but grossly patent to its distal aspect. Superior cerebral arteries patent at their origins. Fetal type origin of the PCAs bilaterally. Both PCAs patent to their distal aspects without visible stenosis. Anatomic variants: Fetal type origin of the PCAs with overall diminutive vertebrobasilar system. No intracranial aneurysm. IMPRESSION: MRI HEAD IMPRESSION: 1. Motion degraded exam. 2. Chronic left MCA distribution infarct involving the left parieto-occipital region, corresponding with abnormality on prior CT. 3. No other acute intracranial abnormality. 4. Underlying mild chronic microvascular ischemic disease. MRA HEAD IMPRESSION: 1. Motion degraded exam. 2. Negative intracranial MRA for large vessel occlusion. No other visible acute abnormality on this motion degraded exam. 3. Fetal type origin of the PCAs with overall diminutive vertebrobasilar system. Electronically Signed   By: Rise Mu M.D.   On: 12/18/2022 00:08   CT Head Wo Contrast  Addendum Date: 12/17/2022   ADDENDUM REPORT: 12/17/2022 17:36 ADDENDUM: These results were called by telephone at the time of interpretation on 12/17/2022 at 5:36 pm to provider  Beckey Downing, MD, who verbally acknowledged these results. Electronically Signed   By: Helyn Numbers M.D.   On: 12/17/2022 17:36   Result Date: 12/17/2022 CLINICAL DATA:  Head trauma, GCS=15, loss of consciousness (LOC) (Ped 0-17y); Neck trauma (Age >= 65y). Fall. EXAM: CT HEAD WITHOUT CONTRAST CT MAXILLOFACIAL WITHOUT CONTRAST CT CERVICAL SPINE WITHOUT CONTRAST TECHNIQUE: Multidetector CT imaging of the head, cervical spine, and maxillofacial structures were performed using the standard protocol without intravenous contrast. Multiplanar CT image reconstructions of the cervical spine and maxillofacial structures were also generated. RADIATION DOSE REDUCTION: This exam was performed according to the departmental dose-optimization program which includes automated exposure control, adjustment of the mA and/or kV according to patient size and/or use of iterative reconstruction technique. COMPARISON:  MRI 10/02/2021 FINDINGS: CT HEAD FINDINGS Brain: There is a left parietal subacute cortical infarct within the posterior left MCA territory. No superimposed acute intracranial hemorrhage or associated abnormal mass effect. No midline shift. Ventricular size is normal. Cerebellum is unremarkable. Vascular: No hyperdense vessel  or unexpected calcification. Skull: Normal. Negative for fracture or focal lesion. Other: Mastoid air cells and middle ear cavities are clear CT MAXILLOFACIAL FINDINGS Osseous: No fracture or mandibular dislocation. No destructive process. Orbits: Negative. No traumatic or inflammatory finding. Sinuses: Clear. Soft tissues: Negative. CT CERVICAL SPINE FINDINGS Alignment: Mild cervical lordosis, likely positional.  No listhesis. Skull base and vertebrae: Craniocervical alignment is normal. Landau dental dental interval is not widened. No acute fracture of the cervical spine. Vertebral body height is preserved. Soft tissues and spinal canal: No prevertebral fluid or swelling. No visible canal hematoma.  Disc levels: Intervertebral disc heights are largely preserved. Minimal degenerative disc annular calcifications noted at C3-C6. Prevertebral soft tissues are not thickened on sagittal reformats. Spinal canal is widely patent. No significant neuroforaminal narrowing. Upper chest: Negative. Other: None IMPRESSION: 1. Subacute left parietal cortical infarct within the posterior left MCA territory. No superimposed acute intracranial hemorrhage or associated abnormal mass effect. 2. No acute facial bone fracture. 3. No acute fracture or listhesis of the cervical spine. Electronically Signed: By: Helyn Numbers M.D. On: 12/17/2022 17:31   CT Maxillofacial Wo Contrast  Addendum Date: 12/17/2022   ADDENDUM REPORT: 12/17/2022 17:36 ADDENDUM: These results were called by telephone at the time of interpretation on 12/17/2022 at 5:36 pm to provider Beckey Downing, MD, who verbally acknowledged these results. Electronically Signed   By: Helyn Numbers M.D.   On: 12/17/2022 17:36   Result Date: 12/17/2022 CLINICAL DATA:  Head trauma, GCS=15, loss of consciousness (LOC) (Ped 0-17y); Neck trauma (Age >= 65y). Fall. EXAM: CT HEAD WITHOUT CONTRAST CT MAXILLOFACIAL WITHOUT CONTRAST CT CERVICAL SPINE WITHOUT CONTRAST TECHNIQUE: Multidetector CT imaging of the head, cervical spine, and maxillofacial structures were performed using the standard protocol without intravenous contrast. Multiplanar CT image reconstructions of the cervical spine and maxillofacial structures were also generated. RADIATION DOSE REDUCTION: This exam was performed according to the departmental dose-optimization program which includes automated exposure control, adjustment of the mA and/or kV according to patient size and/or use of iterative reconstruction technique. COMPARISON:  MRI 10/02/2021 FINDINGS: CT HEAD FINDINGS Brain: There is a left parietal subacute cortical infarct within the posterior left MCA territory. No superimposed acute intracranial  hemorrhage or associated abnormal mass effect. No midline shift. Ventricular size is normal. Cerebellum is unremarkable. Vascular: No hyperdense vessel or unexpected calcification. Skull: Normal. Negative for fracture or focal lesion. Other: Mastoid air cells and middle ear cavities are clear CT MAXILLOFACIAL FINDINGS Osseous: No fracture or mandibular dislocation. No destructive process. Orbits: Negative. No traumatic or inflammatory finding. Sinuses: Clear. Soft tissues: Negative. CT CERVICAL SPINE FINDINGS Alignment: Mild cervical lordosis, likely positional.  No listhesis. Skull base and vertebrae: Craniocervical alignment is normal. Landau dental dental interval is not widened. No acute fracture of the cervical spine. Vertebral body height is preserved. Soft tissues and spinal canal: No prevertebral fluid or swelling. No visible canal hematoma. Disc levels: Intervertebral disc heights are largely preserved. Minimal degenerative disc annular calcifications noted at C3-C6. Prevertebral soft tissues are not thickened on sagittal reformats. Spinal canal is widely patent. No significant neuroforaminal narrowing. Upper chest: Negative. Other: None IMPRESSION: 1. Subacute left parietal cortical infarct within the posterior left MCA territory. No superimposed acute intracranial hemorrhage or associated abnormal mass effect. 2. No acute facial bone fracture. 3. No acute fracture or listhesis of the cervical spine. Electronically Signed: By: Helyn Numbers M.D. On: 12/17/2022 17:31   CT Cervical Spine Wo Contrast  Addendum Date: 12/17/2022   ADDENDUM  REPORT: 12/17/2022 17:36 ADDENDUM: These results were called by telephone at the time of interpretation on 12/17/2022 at 5:36 pm to provider Beckey Downing, MD, who verbally acknowledged these results. Electronically Signed   By: Helyn Numbers M.D.   On: 12/17/2022 17:36   Result Date: 12/17/2022 CLINICAL DATA:  Head trauma, GCS=15, loss of consciousness (LOC) (Ped 0-17y);  Neck trauma (Age >= 65y). Fall. EXAM: CT HEAD WITHOUT CONTRAST CT MAXILLOFACIAL WITHOUT CONTRAST CT CERVICAL SPINE WITHOUT CONTRAST TECHNIQUE: Multidetector CT imaging of the head, cervical spine, and maxillofacial structures were performed using the standard protocol without intravenous contrast. Multiplanar CT image reconstructions of the cervical spine and maxillofacial structures were also generated. RADIATION DOSE REDUCTION: This exam was performed according to the departmental dose-optimization program which includes automated exposure control, adjustment of the mA and/or kV according to patient size and/or use of iterative reconstruction technique. COMPARISON:  MRI 10/02/2021 FINDINGS: CT HEAD FINDINGS Brain: There is a left parietal subacute cortical infarct within the posterior left MCA territory. No superimposed acute intracranial hemorrhage or associated abnormal mass effect. No midline shift. Ventricular size is normal. Cerebellum is unremarkable. Vascular: No hyperdense vessel or unexpected calcification. Skull: Normal. Negative for fracture or focal lesion. Other: Mastoid air cells and middle ear cavities are clear CT MAXILLOFACIAL FINDINGS Osseous: No fracture or mandibular dislocation. No destructive process. Orbits: Negative. No traumatic or inflammatory finding. Sinuses: Clear. Soft tissues: Negative. CT CERVICAL SPINE FINDINGS Alignment: Mild cervical lordosis, likely positional.  No listhesis. Skull base and vertebrae: Craniocervical alignment is normal. Landau dental dental interval is not widened. No acute fracture of the cervical spine. Vertebral body height is preserved. Soft tissues and spinal canal: No prevertebral fluid or swelling. No visible canal hematoma. Disc levels: Intervertebral disc heights are largely preserved. Minimal degenerative disc annular calcifications noted at C3-C6. Prevertebral soft tissues are not thickened on sagittal reformats. Spinal canal is widely patent. No  significant neuroforaminal narrowing. Upper chest: Negative. Other: None IMPRESSION: 1. Subacute left parietal cortical infarct within the posterior left MCA territory. No superimposed acute intracranial hemorrhage or associated abnormal mass effect. 2. No acute facial bone fracture. 3. No acute fracture or listhesis of the cervical spine. Electronically Signed: By: Helyn Numbers M.D. On: 12/17/2022 17:31   DG Chest Portable 1 View  Result Date: 12/17/2022 CLINICAL DATA:  Altered mental status. EXAM: PORTABLE CHEST 1 VIEW COMPARISON:  11/18/2022 FINDINGS: Stable enlarged cardiac silhouette and tortuous, partially calcified thoracic aorta. Decreased patchy and linear density at the left lung base with a small left pleural effusion. Clear right lung. Thoracic spine degenerative changes. IMPRESSION: Decreased left basilar atelectasis and probable pneumonia with a small left pleural effusion. Electronically Signed   By: Beckie Salts M.D.   On: 12/17/2022 17:18    ROS: all other systems reviewed and negative except as per HPI Blood pressure (!) 156/78, pulse 72, temperature 98.2 F (36.8 C), resp. rate 20, height 5' (1.524 m), weight 84.3 kg, SpO2 99%. GEN  NAD, sitting in bed HEENT R eye bruise NECK no JVD PULM clear CV RRR ABD soft EXT no LE edema ACCESS: L AVF T/B  Assessment/Plan: 1 Subacute CVA: per neuro/ primary.  Failed BS swallow initially, then passed, so is on diet now 2 ESRD: MWF [facility] .  HD on schedule today, no heparin  3 Hypertension: reasonable control, UF as tolerated 4. Anemia of ESRD:  Hgb 11.1, no ESA needed 5. Metabolic Bone Disease: velphoro as binder-  6. Dispo: pending  Michal Strzelecki,  Berklie Dethlefs 12/18/2022, 11:12 AM

## 2022-12-18 NOTE — Plan of Care (Signed)
  Problem: Acute Rehab PT Goals(only PT should resolve) Goal: Pt will Roll Supine to Side Outcome: Progressing Flowsheets (Taken 12/18/2022 1438) Pt will Roll Supine to Side:  with modified independence  with supervision Goal: Pt Will Go Supine/Side To Sit Outcome: Progressing Flowsheets (Taken 12/18/2022 1438) Pt will go Supine/Side to Sit:  with modified independence  with supervision Goal: Pt Will Go Sit To Supine/Side Outcome: Progressing Flowsheets (Taken 12/18/2022 1438) Pt will go Sit to Supine/Side:  with modified independence  with supervision Goal: Patient Will Perform Sitting Balance Outcome: Progressing Flowsheets (Taken 12/18/2022 1438) Patient will perform sitting balance:  with modified independence  Independently Goal: Pt Will Transfer Bed To Chair/Chair To Bed Outcome: Progressing Flowsheets (Taken 12/18/2022 1438) Pt will Transfer Bed to Chair/Chair to Bed: with min assist   2:38 PM, 12/18/22 Ocie Bob, MPT Physical Therapist with [facility] [phone] office [phone] mobile phone

## 2022-12-19 ENCOUNTER — Encounter (HOSPITAL_COMMUNITY): Payer: Self-pay | Admitting: Family Medicine

## 2022-12-19 DIAGNOSIS — S88912A Complete traumatic amputation of left lower leg, level unspecified, initial encounter: Secondary | ICD-10-CM | POA: Diagnosis not present

## 2022-12-19 DIAGNOSIS — E1122 Type 2 diabetes mellitus with diabetic chronic kidney disease: Secondary | ICD-10-CM | POA: Diagnosis not present

## 2022-12-19 DIAGNOSIS — Z89511 Acquired absence of right leg below knee: Secondary | ICD-10-CM | POA: Diagnosis not present

## 2022-12-19 DIAGNOSIS — T8781 Dehiscence of amputation stump: Secondary | ICD-10-CM | POA: Diagnosis not present

## 2022-12-19 DIAGNOSIS — I96 Gangrene, not elsewhere classified: Secondary | ICD-10-CM | POA: Diagnosis not present

## 2022-12-19 DIAGNOSIS — Z992 Dependence on renal dialysis: Secondary | ICD-10-CM | POA: Diagnosis not present

## 2022-12-19 DIAGNOSIS — E782 Mixed hyperlipidemia: Secondary | ICD-10-CM | POA: Diagnosis not present

## 2022-12-19 DIAGNOSIS — S0591XA Unspecified injury of right eye and orbit, initial encounter: Secondary | ICD-10-CM | POA: Diagnosis not present

## 2022-12-19 DIAGNOSIS — N186 End stage renal disease: Secondary | ICD-10-CM | POA: Diagnosis not present

## 2022-12-19 DIAGNOSIS — I739 Peripheral vascular disease, unspecified: Secondary | ICD-10-CM | POA: Diagnosis not present

## 2022-12-19 DIAGNOSIS — I5032 Chronic diastolic (congestive) heart failure: Secondary | ICD-10-CM | POA: Diagnosis not present

## 2022-12-19 DIAGNOSIS — I639 Cerebral infarction, unspecified: Secondary | ICD-10-CM | POA: Diagnosis not present

## 2022-12-19 LAB — GLUCOSE, CAPILLARY
Glucose-Capillary: 149 mg/dL — ABNORMAL HIGH (ref 70–99)
Glucose-Capillary: 116 mg/dL — ABNORMAL HIGH (ref 70–99)
Glucose-Capillary: 125 mg/dL — ABNORMAL HIGH (ref 70–99)
Glucose-Capillary: 109 mg/dL — ABNORMAL HIGH (ref 70–99)
Glucose-Capillary: 71 mg/dL (ref 70–99)

## 2022-12-19 LAB — HEPATITIS B SURFACE ANTIBODY, QUANTITATIVE: Hep B S AB Quant (Post): 462 m[IU]/mL

## 2022-12-19 MED ORDER — LIDOCAINE-PRILOCAINE 2.5-2.5 % EX CREA: 1.0000 | TOPICAL_CREAM | CUTANEOUS | Status: DC | PRN

## 2022-12-19 MED ORDER — LIDOCAINE HCL (PF) 1 % IJ SOLN: 5.0000 mL | INTRAMUSCULAR | Status: DC | PRN

## 2022-12-19 MED ORDER — PENTAFLUOROPROP-TETRAFLUOROETH EX AERO
1.0000 | INHALATION_SPRAY | CUTANEOUS | Status: DC | PRN
  Filled 2022-12-19: qty 30

## 2022-12-19 MED ORDER — ASPIRIN 81 MG PO CHEW: 162.0000 mg | CHEWABLE_TABLET | Freq: Every day | ORAL | Status: DC

## 2022-12-19 MED ORDER — HEPARIN SODIUM (PORCINE) 1000 UNIT/ML DIALYSIS: 1000.0000 [IU] | INTRAMUSCULAR | Status: DC | PRN

## 2022-12-19 NOTE — Evaluation (Signed)
Speech Language Pathology Evaluation Patient Details Name: SCHERRY BROUSSARD MRN: [id number] DOB: 15-Mar-1956 Today's Date: 12/19/2022 Time: 4098-1191 SLP Time Calculation (min) (ACUTE ONLY): 23 min  Problem List:  Patient Active Problem List   Diagnosis Date Noted   Fall at home 12/18/2022   CVA (cerebral vascular accident) (HCC) 12/17/2022   Acute confusion 12/10/2022   Poor appetite 12/10/2022   Chills 12/10/2022   Generalized weakness 11/18/2022   Paronychia of finger of right hand 11/13/2022   Dehiscence of amputation stump of left lower extremity (HCC) 10/07/2022   Status post below-knee amputation of left lower extremity (HCC) 10/07/2022   Subacute osteomyelitis, left ankle and foot (HCC) 09/03/2022   Left foot infection 09/03/2022   Amputated left leg (HCC) 09/03/2022   Wound infection after surgery 09/03/2022   Equinus contracture of left ankle 08/15/2022   Acute osteomyelitis of toe of left foot (HCC) 08/13/2022   Peripheral vascular complication 07/21/2022   PVD (peripheral vascular disease) (HCC) 07/18/2022   Toe ulcer, left, limited to breakdown of skin (HCC) 07/13/2022   S/P BKA (below knee amputation), right (HCC) 05/29/2022   Confusion 05/19/2022   Visual hallucinations 05/19/2022   Watery eyes 05/19/2022   Requires assistance with activities of daily living (ADL) 04/26/2022   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   Gangrene of right foot (HCC) 04/25/2022   PAD (peripheral artery disease) (HCC) 04/25/2022   Other osteoporosis without current pathological fracture 11/10/2021   Hypocalcemia 11/10/2021   Fall 11/02/2021   Left foot pain 10/31/2021   Low back pain with left-sided sciatica 10/31/2021   Left ear impacted cerumen 09/18/2021   Chronic left shoulder pain 06/10/2021   Abnormal CXR 03/10/2021   Ear pain, bilateral 03/10/2021   Morbid obesity (HCC) 03/10/2021   Subungual hematoma of second toe of left foot 10/28/2020   Insomnia 10/28/2020    Memory loss or impairment 09/26/2020   Forgetfulness 09/26/2020   Recurrent vertigo 09/26/2020   Abnormal MRI, cervical spine 03/04/2020   Right leg weakness 02/28/2020   Knee pain, right 10/23/2019   Cough 08/09/2019   Carpal tunnel syndrome 06/13/2019   Chronic pain syndrome 06/13/2019   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) 06/13/2019   Neck pain 03/23/2019   Near syncope 12/05/2018   ESRD on hemodialysis (HCC) 11/22/2018   Ischemic heart disease 09/25/2018   Not currently working due to disabled status 06/13/2018   HTN (hypertension) 06/12/2018   Depression 03/09/2018   Adrenal mass, left (HCC) 11/09/2017   MGUS (monoclonal gammopathy of unknown significance) 11/05/2017   Shoulder pain 11/01/2017   Chronic diastolic (congestive) heart failure (HCC) 09/20/2017   Bilateral leg weakness 09/09/2017   Adrenal adenoma 09/03/2017   Uncontrolled type 2 diabetes mellitus 08/18/2017   Unsteady gait 07/11/2017   Recurrent falls 07/11/2017   Anemia of chronic disease 03/24/2017   Personal history of colonic polyps 02/10/2017   Dysphagia 11/05/2016   Irritable bowel syndrome 02/04/2016   Hospital discharge follow-up 10/27/2015   Intolerant of heat 10/07/2014   Cardiac murmur 01/17/2014   Back pain with left-sided radiculopathy 09/18/2013   Seasonal allergies 03/10/2013   Lipoma of back 12/22/2012   Other seasonal allergic rhinitis 08/22/2012   OSA (obstructive sleep apnea) 06/02/2011   Chronic kidney disease, stage 4 (severe) (HCC) 01/13/2011   Neuropathy 04/16/2010   Malaise and fatigue 07/24/2009   Personal history of other diseases of the nervous system and sense organs 05/08/2009   LUPUS ERYTHEMATOSUS, DISCOID  06/05/2008   Arm pain 05/30/2008   Hyperlipidemia 06/07/2007   Obesity 06/07/2007   Malignant hypertension 06/07/2007   Type 2 diabetes mellitus with hyperglycemia (HCC) 06/07/2007   Diabetes mellitus (HCC) 06/07/2007   Past Medical History:   Past Medical History:  Diagnosis Date   Acid reflux    Amputated left leg (HCC)    Anemia    Arthritis    Axillary masses    Soft tissue - status post excision   Back pain    Cancer (HCC)    CHF (congestive heart failure) (HCC)    COVID-19 virus infection 04/06/2019   Depression    End-stage renal disease (HCC)    M/W/F dialysis   Essential hypertension    Headache    years ago   History of blood transfusion    History of cardiac catheterization    Normal coronary arteries October 2020   History of claustrophobia    History of pneumonia 2019   Hypoxia 04/03/2019   Memory loss    Mixed hyperlipidemia    Obesity    Pancreatitis    Peritoneal dialysis catheter in place [facility])    Pneumonia due to COVID-19 virus 04/02/2019   Sleep apnea    Noncompliant with CPAP   Stroke (HCC)    mini stroke   Type 2 diabetes mellitus (HCC)    Past Surgical History:  Past Surgical History:  Procedure Laterality Date   ABDOMINAL AORTOGRAM W/LOWER EXTREMITY N/A 04/30/2022   Procedure: ABDOMINAL AORTOGRAM W/LOWER EXTREMITY;  Surgeon: Leonie Douglas, MD;  Location: [facility] INVAS[facility];  Service: Cardiovascular;  Laterality: N/A;   ABDOMINAL AORTOGRAM W/LOWER EXTREMITY N/A 07/21/2022   Procedure: ABDOMINAL AORTOGRAM W/LOWER EXTREMITY;  Surgeon: Nada Libman, MD;  Location: [facility];  Service: Cardiovascular;  Laterality: N/A;   ABDOMINAL HYSTERECTOMY     ACHILLES TENDON LENGTHENING  08/15/2022   Procedure: ACHILLES TENDON LENGTHENING;  Surgeon: [name], DPM;  Location: [facility] OR;  Service: Podiatry;;   AMPUTATION Right 05/29/2022   Procedure: RIGHT BELOW THE KNEE AMPUTATION;  Surgeon: [name], MD;  Location: [facility] OR;  Service: Orthopedics;  Laterality: Right;   AMPUTATION Left 09/04/2022   Procedure: AMPUTATION FOOT, serial irrigation;  Surgeon: Louann Sjogren, DPM;  Location: [facility] OR;  Service: Podiatry;  Laterality: Left;  Surgical team to do block   AMPUTATION Left  10/07/2022   Procedure: LEFT BELOW KNEE AMPUTATION;  Surgeon: [name], MD;  Location: [facility] OR;  Service: Orthopedics;  Laterality: Left;   AV FISTULA PLACEMENT Left 09/02/2017   Procedure: creation of left arm ARTERIOVENOUS (AV) FISTULA;  Surgeon: Nada Libman, MD;  Location: [facility] OR;  Service: Vascular;  Laterality: Left;   COLONOSCOPY  2008   Dr. Darrick Penna: normal    COLONOSCOPY N/A 12/18/2016   Dr. Darrick Penna: multiple tubular adenomas, internal hemorrhoids. Surveillance in 3 years    ESOPHAGEAL DILATION N/A 10/13/2015   Procedure: ESOPHAGEAL DILATION;  Surgeon: Malissa Hippo, MD;  Location: [facility];  Service: Endoscopy;  Laterality: N/A;   ESOPHAGOGASTRODUODENOSCOPY N/A 10/13/2015   Dr. Karilyn Cota: chronic gastritis on path, no H.pylori. Empiric dilation    ESOPHAGOGASTRODUODENOSCOPY N/A 12/18/2016   Dr. Darrick Penna: mild gastritis. BRAVO study revealed uncontrolled GERD. Dysphagia secondary to uncontrolled reflux   FOOT SURGERY Bilateral    "nerve"     LEFT HEART CATH AND CORONARY ANGIOGRAPHY N/A 12/29/2018   Procedure: LEFT HEART CATH AND CORONARY ANGIOGRAPHY;  Surgeon: Corky Crafts, MD;  Location: [facility];  Service: Cardiovascular;  Laterality: N/A;   LOWER EXTREMITY ANGIOGRAPHY Right 05/04/2022   Procedure: Lower Extremity Angiography;  Surgeon: Victorino Sparrow, MD;  Location: [facility] INVASIVE CV LAB;  Service: Cardiovascular;  Laterality: Right;   LUNG BIOPSY     MASS EXCISION Right 01/09/2013   Procedure: EXCISION OF NEOPLASM OF RIGHT  AXILLA  AND EXCISION OF NEOPLASM OF LEFT AXILLA;  Surgeon: Dalia Heading, MD;  Location: [facility] ORS;  Service: General;  Laterality: Right;  procedure end @ 08:23   MYRINGOTOMY WITH TUBE PLACEMENT Bilateral 04/28/2017   Procedure: BILATERAL MYRINGOTOMY WITH TUBE PLACEMENT;  Surgeon: Newman Pies, MD;  Location: [facility] OR;  Service: ENT;  Laterality: Bilateral;   PERIPHERAL VASCULAR BALLOON ANGIOPLASTY Right 05/04/2022   Procedure: PERIPHERAL VASCULAR  BALLOON ANGIOPLASTY;  Surgeon: Victorino Sparrow, MD;  Location: [facility] INVASIVE CV LAB;  Service: Cardiovascular;  Laterality: Right;  PT   PERIPHERAL VASCULAR INTERVENTION Right 05/04/2022   Procedure: PERIPHERAL VASCULAR INTERVENTION;  Surgeon: Victorino Sparrow, MD;  Location: [facility] INVASIVE CV LAB;  Service: Cardiovascular;  Laterality: Right;  SFA   PERIPHERAL VASCULAR INTERVENTION Left 07/21/2022   Procedure: PERIPHERAL VASCULAR INTERVENTION;  Surgeon: Nada Libman, MD;  [facility]: [facility];  Service: Cardiovascular;  Laterality: Left;   REVISION OF ARTERIOVENOUS GORETEX GRAFT Left 05/04/2018   Procedure: TRANSPOSITION OF CEPHALIC VEIN ARTERIOVENOUS FISTULA LEFT ARM;  Surgeon: Larina Earthly, MD;  Location: [facility] OR;  Service: Vascular;  Laterality: Left;   SAVORY DILATION N/A 12/18/2016   Procedure: SAVORY DILATION;  Surgeon: West Bali, MD;  [facility]: [facility];  Service: Endoscopy;  Laterality: N/A;   TRANSMETATARSAL AMPUTATION Left 08/15/2022   Procedure: TRANSMETATARSAL AMPUTATION;  Surgeon: [name], DPM;  Location: [facility] OR;  Service: Podiatry;  Laterality: Left;   HPI:  Ms. Brittiny Nestel. Sees is a 67 y.o. female,  with medical history significant of acid reflux, depression, ESRD on HD, hyperlipidemia, hypertension, diabetes mellitus, memory loss, with severe PVD, status post bilateral AKA, on aspirin and Plavix, Plavix has been stopped last month per husband at bedside due to adverse side effects. maxillofacial without contrast was significant for subacute left parietal cortical infarct within the posterior left MCA territory.   Assessment / Plan / Recommendation Clinical Impression  Speech and Language evaluation completed while Pt was sitting upright in bed. Pt presents today with moderate to severe cognitive impairment. Pt reports that she feels "confused", per husband report via phone, Pt has baseline memory deficits and confusion that is waxing and waning (significantly  worse when Pt misses dialysis). Please note today's presentation is likely negatively impacted by the fact that she did not receive HD yesterday secondary to high census. Husband reports cognition has declined over the past two months but that her current presentation is significantly worse than normal. Pt was oriented to self and situation but not to place or time which is a change from this morning as she was oriented X4 per nursing report. Pt was unable to tell me her children's names but repeatedly told me the ages of three of her grandchildren and thier names. She reported that her daughter was three years old. Areas of need identified by subjective assessment at bedside indicate needs in memory, safety, awareness, thought organization and executive functioning. Recommend HH ST to assess and provided therapy as indicated in the home to facilitate cognitive maintenence, support and strategies which will decrease caregiver burden. All further ST  needs can be met at next venue. Thank you for this referral,    SLP Assessment  SLP Recommendation/Assessment: Patient needs continued Speech Lanaguage Pathology Services SLP Visit Diagnosis: Cognitive communication deficit (R41.841)    Recommendations for follow up therapy are one component of a multi-disciplinary discharge planning process, led by the attending physician.  Recommendations may be updated based on patient status, additional functional criteria and insurance authorization.    Follow Up Recommendations  Home health SLP    Assistance Recommended at Discharge     Functional Status Assessment Patient has had a recent decline in their functional status and demonstrates the ability to make significant improvements in function in a reasonable and predictable amount of time.  Frequency and Duration min 1 x/week  1 week      SLP Evaluation Cognition  Orientation Level: Oriented X4 Memory: Impaired Memory Impairment: Decreased short term  memory Decreased Short Term Memory: Verbal basic Awareness: Impaired Awareness Impairment: Emergent impairment;Intellectual impairment Problem Solving: Impaired Problem Solving Impairment: Verbal basic Executive Function: Reasoning;Organizing;Decision Making Reasoning: Impaired Reasoning Impairment: Verbal basic Organizing: Impaired Organizing Impairment: Verbal basic Decision Making: Impaired Decision Making Impairment: Verbal basic       Comprehension  Auditory Comprehension Overall Auditory Comprehension: Appears within functional limits for tasks assessed Visual Recognition/Discrimination Discrimination: Not tested Reading Comprehension Reading Status: Not tested    Expression Expression Primary Mode of Expression: Verbal Verbal Expression Overall Verbal Expression: Appears within functional limits for tasks assessed Written Expression Dominant Hand: Right   Oral / Motor  Oral Motor/Sensory Function Overall Oral Motor/Sensory Function: Within functional limits Motor Speech Overall Motor Speech: Appears within functional limits for tasks assessed            Chastidy Ranker H. Romie Levee, CCC-SLP Speech Language Pathologist   Georgetta Haber 12/19/2022, 12:03 PM

## 2022-12-19 NOTE — Progress Notes (Signed)
SLP Cancellation Note  Patient Details Name: CAMRYNN MCCLINTIC MRN: [id number] DOB: [birth year]   Cancelled treatment:       Reason Eval/Treat Not Completed: SLP screened, no needs identified, will sign off. Pt passed [place] and is tolerating a regular/thin diet. Will complete order for BSE. SLE to follow, thank you.  Charolett Yarrow H. Romie Levee, CCC-SLP Speech Language Pathologist    Georgetta Haber 12/19/2022, 11:32 AM

## 2022-12-19 NOTE — Discharge Summary (Addendum)
tablets) by mouth with meals and 500mg  (1 tablet) with snacks               Durable Medical Equipment  (From admission, onward)           Start     Ordered   12/18/22 1530  For home use only DME Hospital bed  Once       Question Answer Comment  Length of Need Lifetime   The above medical condition requires: Patient requires the ability to reposition frequently   Bed type Semi-electric      12/18/22 1529            Procedures/Studies: US Carotid Bilateral (at [facility] and [facility] only)  Result Date: 12/19/2022 CLINICAL DATA:  67 year old female with CVA EXAM: BILATERAL CAROTID DUPLEX ULTRASOUND TECHNIQUE: Wallace Cullens scale imaging, color Doppler and duplex ultrasound were performed of bilateral carotid and vertebral arteries in the neck. COMPARISON:  None Available. FINDINGS: Criteria: Quantification of carotid stenosis is based on velocity parameters that correlate the residual internal carotid diameter with NASCET-based stenosis levels, using the diameter of the distal internal carotid lumen as the denominator for stenosis measurement. The following velocity measurements were obtained: RIGHT ICA:  Systolic 51 cm/sec, Diastolic 11 cm/sec CCA:  58 cm/sec SYSTOLIC ICA/CCA RATIO:  0.9 ECA:  52 cm/sec LEFT ICA:  Systolic 45 cm/sec, Diastolic 5 5 cm/sec CCA:  72 cm/sec SYSTOLIC ICA/CCA RATIO:  0.6 ECA:  32 cm/sec Right Brachial SBP: Not acquired Left Brachial SBP: Not acquired RIGHT CAROTID ARTERY: No significant calcifications of the right common carotid artery. Intermediate waveform maintained. Heterogeneous and partially calcified plaque at the right carotid bifurcation. No significant lumen shadowing. Low resistance waveform of the right ICA. No significant tortuosity. RIGHT VERTEBRAL ARTERY: Antegrade flow with low resistance waveform. LEFT CAROTID ARTERY: No significant calcifications  of the left common carotid artery. Intermediate waveform maintained. Heterogeneous and partially calcified plaque at the left carotid bifurcation. No significant lumen shadowing. Low resistance waveform of the left ICA. No significant tortuosity. LEFT VERTEBRAL ARTERY:  Antegrade flow with low resistance waveform. IMPRESSION: Color duplex indicates minimal heterogeneous and calcified plaque, with no hemodynamically significant stenosis by duplex criteria in the extracranial cerebrovascular circulation. Signed, Yvone Neu. Miachel Roux, RPVI [facility] [place] [facility] Electronically Signed   By: Gilmer Mor D.O.   On: 12/19/2022 07:29   ECHOCARDIOGRAM COMPLETE  Result Date: 12/18/2022    ECHOCARDIOGRAM REPORT   Patient Name:   Kathryn Beck Date of Exam: 12/18/2022 Medical Rec #:  [id number]       Height:       60.0 in Accession #:    [id number]      Weight:       185.8 lb Date of Birth:  Apr 11, 1955       BSA:          1.809 m Patient Age:    67 years        BP:           180/82 mmHg Patient Gender: F               HR:           76 bpm. [facility] Procedure: 2D Echo, Cardiac Doppler and Color Doppler Indications:    Stroke I63.9  History:        Patient has prior history of Echocardiogram examinations, most  Physician Discharge Summary  Kathryn Beck:[id number] DOB: 05-26-1955 DOA: 12/17/2022  PCP: Kathryn Perches, MD Neurologist: Dr. Amalia Beck  Admit date: 12/17/2022 Discharge date: 12/19/2022  Admitted From:  Home with HH Disposition:  Home with [facility]   Recommendations for Outpatient Follow-up:  Follow up with PCP in 1 weeks Follow up with neurologist in 2 weeks  Resume regular hemodialysis treatment schedule  Home Health: PT, SLP, Aide  Discharge Condition: STABLE   CODE STATUS: FULL DIET: renal    Brief Hospitalization Summary: Please see all hospital notes, images, labs for full details of the hospitalization. Admission Provider HPI: 67 y.o. female,  with medical history significant of acid reflux, depression, ESRD on HD, hyperlipidemia, hypertension, diabetes mellitus, memory loss, with severe PVD, status post bilateral AKA, on aspirin and Plavix, Plavix has been stopped last month per husband at bedside due to adverse side effects.   Pt presented status post fall at home, injury to the right eye, husband at bedside assists with the history, she is wheelchair dependent, she fell out of her chair yesterday when she was going from her wheelchair to a chair, she slipped, she fell, where right side of her face hit the ground, denies any other injuries, no loss of consciousness, she denies any dizziness, lightheadedness, husband at bedside patient has been more confused over the last few days, no fever, no chills, she received her full session of dialysis yesterday, with no complications.  ED course: patient received Tdap vaccine for her right eye injury, CT head, cervical spine, maxillofacial without contrast was significant for subacute left parietal cortical infarct within the posterior left MCA territory, So [facility] hospitalist consulted to admit   Hospital Course:  Pt was admitted for observation after a fall at home and had syncope work up. Her echo and carotid doppler testing was  reassuring and stable from prior testing.  No acute CVA on MRI only chronic findings which were reviewed by neurology.  She is feeling well back to baseline status and agreeable to resumption of home health services.  She had hemodialysis on 9/28 prior to discharge and advised to resume regular outpatient HD schedule.  Pt is discharging home in stable condition with recommendation for close outpatient follow up.     Fall at home - follow up PT/OT eval and recommendations - anticipate can discharge on 9/28 - continue fall precautions    Chronic CVA - MRI completed and consistent with chronic findings - reviewed with neurologist - continue aspirin daily  - pt reports she had adverse side effects from clopidogrel and was stopped 1 mos ago   PVD  - stable on home aspirin zetia   Essential hypertension - suboptimally controlled - resumed home meds   Hyperlipidemia - reports statin intolerance  - resumed on zetia    Hypokalemia - repleted    Type 2 DM with nephropathy, vascular complications - follow up A1c--7.7%  - resume some prandial and bolus insulin - SSI coverage ordered - CBG monitoring 5x per day in hospital  CBG (last 3)  Recent Labs    12/19/22 0305 12/19/22 0718 12/19/22 1115  GLUCAP 149* 116* 125*    ESRD on HD - appreciate nephrology consult for inpatient HD    Discharge Diagnoses:  Principal Problem:   CVA (cerebral vascular accident) (HCC) Active Problems:   Hyperlipidemia   Anemia of chronic disease   Chronic diastolic (congestive) heart failure (HCC)   HTN (hypertension)   Diabetes mellitus (HCC)   ESRD  Physician Discharge Summary  Kathryn Beck:[id number] DOB: 05-26-1955 DOA: 12/17/2022  PCP: Kathryn Perches, MD Neurologist: Dr. Amalia Beck  Admit date: 12/17/2022 Discharge date: 12/19/2022  Admitted From:  Home with HH Disposition:  Home with [facility]   Recommendations for Outpatient Follow-up:  Follow up with PCP in 1 weeks Follow up with neurologist in 2 weeks  Resume regular hemodialysis treatment schedule  Home Health: PT, SLP, Aide  Discharge Condition: STABLE   CODE STATUS: FULL DIET: renal    Brief Hospitalization Summary: Please see all hospital notes, images, labs for full details of the hospitalization. Admission Provider HPI: 67 y.o. female,  with medical history significant of acid reflux, depression, ESRD on HD, hyperlipidemia, hypertension, diabetes mellitus, memory loss, with severe PVD, status post bilateral AKA, on aspirin and Plavix, Plavix has been stopped last month per husband at bedside due to adverse side effects.   Pt presented status post fall at home, injury to the right eye, husband at bedside assists with the history, she is wheelchair dependent, she fell out of her chair yesterday when she was going from her wheelchair to a chair, she slipped, she fell, where right side of her face hit the ground, denies any other injuries, no loss of consciousness, she denies any dizziness, lightheadedness, husband at bedside patient has been more confused over the last few days, no fever, no chills, she received her full session of dialysis yesterday, with no complications.  ED course: patient received Tdap vaccine for her right eye injury, CT head, cervical spine, maxillofacial without contrast was significant for subacute left parietal cortical infarct within the posterior left MCA territory, So [facility] hospitalist consulted to admit   Hospital Course:  Pt was admitted for observation after a fall at home and had syncope work up. Her echo and carotid doppler testing was  reassuring and stable from prior testing.  No acute CVA on MRI only chronic findings which were reviewed by neurology.  She is feeling well back to baseline status and agreeable to resumption of home health services.  She had hemodialysis on 9/28 prior to discharge and advised to resume regular outpatient HD schedule.  Pt is discharging home in stable condition with recommendation for close outpatient follow up.     Fall at home - follow up PT/OT eval and recommendations - anticipate can discharge on 9/28 - continue fall precautions    Chronic CVA - MRI completed and consistent with chronic findings - reviewed with neurologist - continue aspirin daily  - pt reports she had adverse side effects from clopidogrel and was stopped 1 mos ago   PVD  - stable on home aspirin zetia   Essential hypertension - suboptimally controlled - resumed home meds   Hyperlipidemia - reports statin intolerance  - resumed on zetia    Hypokalemia - repleted    Type 2 DM with nephropathy, vascular complications - follow up A1c--7.7%  - resume some prandial and bolus insulin - SSI coverage ordered - CBG monitoring 5x per day in hospital  CBG (last 3)  Recent Labs    12/19/22 0305 12/19/22 0718 12/19/22 1115  GLUCAP 149* 116* 125*    ESRD on HD - appreciate nephrology consult for inpatient HD    Discharge Diagnoses:  Principal Problem:   CVA (cerebral vascular accident) (HCC) Active Problems:   Hyperlipidemia   Anemia of chronic disease   Chronic diastolic (congestive) heart failure (HCC)   HTN (hypertension)   Diabetes mellitus (HCC)   ESRD  tablets) by mouth with meals and 500mg  (1 tablet) with snacks               Durable Medical Equipment  (From admission, onward)           Start     Ordered   12/18/22 1530  For home use only DME Hospital bed  Once       Question Answer Comment  Length of Need Lifetime   The above medical condition requires: Patient requires the ability to reposition frequently   Bed type Semi-electric      12/18/22 1529            Procedures/Studies: US Carotid Bilateral (at [facility] and [facility] only)  Result Date: 12/19/2022 CLINICAL DATA:  67 year old female with CVA EXAM: BILATERAL CAROTID DUPLEX ULTRASOUND TECHNIQUE: Wallace Cullens scale imaging, color Doppler and duplex ultrasound were performed of bilateral carotid and vertebral arteries in the neck. COMPARISON:  None Available. FINDINGS: Criteria: Quantification of carotid stenosis is based on velocity parameters that correlate the residual internal carotid diameter with NASCET-based stenosis levels, using the diameter of the distal internal carotid lumen as the denominator for stenosis measurement. The following velocity measurements were obtained: RIGHT ICA:  Systolic 51 cm/sec, Diastolic 11 cm/sec CCA:  58 cm/sec SYSTOLIC ICA/CCA RATIO:  0.9 ECA:  52 cm/sec LEFT ICA:  Systolic 45 cm/sec, Diastolic 5 5 cm/sec CCA:  72 cm/sec SYSTOLIC ICA/CCA RATIO:  0.6 ECA:  32 cm/sec Right Brachial SBP: Not acquired Left Brachial SBP: Not acquired RIGHT CAROTID ARTERY: No significant calcifications of the right common carotid artery. Intermediate waveform maintained. Heterogeneous and partially calcified plaque at the right carotid bifurcation. No significant lumen shadowing. Low resistance waveform of the right ICA. No significant tortuosity. RIGHT VERTEBRAL ARTERY: Antegrade flow with low resistance waveform. LEFT CAROTID ARTERY: No significant calcifications  of the left common carotid artery. Intermediate waveform maintained. Heterogeneous and partially calcified plaque at the left carotid bifurcation. No significant lumen shadowing. Low resistance waveform of the left ICA. No significant tortuosity. LEFT VERTEBRAL ARTERY:  Antegrade flow with low resistance waveform. IMPRESSION: Color duplex indicates minimal heterogeneous and calcified plaque, with no hemodynamically significant stenosis by duplex criteria in the extracranial cerebrovascular circulation. Signed, Yvone Neu. Miachel Roux, RPVI [facility] [place] [facility] Electronically Signed   By: Gilmer Mor D.O.   On: 12/19/2022 07:29   ECHOCARDIOGRAM COMPLETE  Result Date: 12/18/2022    ECHOCARDIOGRAM REPORT   Patient Name:   Kathryn Beck Date of Exam: 12/18/2022 Medical Rec #:  [id number]       Height:       60.0 in Accession #:    [id number]      Weight:       185.8 lb Date of Birth:  Apr 11, 1955       BSA:          1.809 m Patient Age:    67 years        BP:           180/82 mmHg Patient Gender: F               HR:           76 bpm. [facility] Procedure: 2D Echo, Cardiac Doppler and Color Doppler Indications:    Stroke I63.9  History:        Patient has prior history of Echocardiogram examinations, most  Physician Discharge Summary  Kathryn Beck:[id number] DOB: 05-26-1955 DOA: 12/17/2022  PCP: Kathryn Perches, MD Neurologist: Dr. Amalia Beck  Admit date: 12/17/2022 Discharge date: 12/19/2022  Admitted From:  Home with HH Disposition:  Home with [facility]   Recommendations for Outpatient Follow-up:  Follow up with PCP in 1 weeks Follow up with neurologist in 2 weeks  Resume regular hemodialysis treatment schedule  Home Health: PT, SLP, Aide  Discharge Condition: STABLE   CODE STATUS: FULL DIET: renal    Brief Hospitalization Summary: Please see all hospital notes, images, labs for full details of the hospitalization. Admission Provider HPI: 67 y.o. female,  with medical history significant of acid reflux, depression, ESRD on HD, hyperlipidemia, hypertension, diabetes mellitus, memory loss, with severe PVD, status post bilateral AKA, on aspirin and Plavix, Plavix has been stopped last month per husband at bedside due to adverse side effects.   Pt presented status post fall at home, injury to the right eye, husband at bedside assists with the history, she is wheelchair dependent, she fell out of her chair yesterday when she was going from her wheelchair to a chair, she slipped, she fell, where right side of her face hit the ground, denies any other injuries, no loss of consciousness, she denies any dizziness, lightheadedness, husband at bedside patient has been more confused over the last few days, no fever, no chills, she received her full session of dialysis yesterday, with no complications.  ED course: patient received Tdap vaccine for her right eye injury, CT head, cervical spine, maxillofacial without contrast was significant for subacute left parietal cortical infarct within the posterior left MCA territory, So [facility] hospitalist consulted to admit   Hospital Course:  Pt was admitted for observation after a fall at home and had syncope work up. Her echo and carotid doppler testing was  reassuring and stable from prior testing.  No acute CVA on MRI only chronic findings which were reviewed by neurology.  She is feeling well back to baseline status and agreeable to resumption of home health services.  She had hemodialysis on 9/28 prior to discharge and advised to resume regular outpatient HD schedule.  Pt is discharging home in stable condition with recommendation for close outpatient follow up.     Fall at home - follow up PT/OT eval and recommendations - anticipate can discharge on 9/28 - continue fall precautions    Chronic CVA - MRI completed and consistent with chronic findings - reviewed with neurologist - continue aspirin daily  - pt reports she had adverse side effects from clopidogrel and was stopped 1 mos ago   PVD  - stable on home aspirin zetia   Essential hypertension - suboptimally controlled - resumed home meds   Hyperlipidemia - reports statin intolerance  - resumed on zetia    Hypokalemia - repleted    Type 2 DM with nephropathy, vascular complications - follow up A1c--7.7%  - resume some prandial and bolus insulin - SSI coverage ordered - CBG monitoring 5x per day in hospital  CBG (last 3)  Recent Labs    12/19/22 0305 12/19/22 0718 12/19/22 1115  GLUCAP 149* 116* 125*    ESRD on HD - appreciate nephrology consult for inpatient HD    Discharge Diagnoses:  Principal Problem:   CVA (cerebral vascular accident) (HCC) Active Problems:   Hyperlipidemia   Anemia of chronic disease   Chronic diastolic (congestive) heart failure (HCC)   HTN (hypertension)   Diabetes mellitus (HCC)   ESRD  tablets) by mouth with meals and 500mg  (1 tablet) with snacks               Durable Medical Equipment  (From admission, onward)           Start     Ordered   12/18/22 1530  For home use only DME Hospital bed  Once       Question Answer Comment  Length of Need Lifetime   The above medical condition requires: Patient requires the ability to reposition frequently   Bed type Semi-electric      12/18/22 1529            Procedures/Studies: US Carotid Bilateral (at [facility] and [facility] only)  Result Date: 12/19/2022 CLINICAL DATA:  67 year old female with CVA EXAM: BILATERAL CAROTID DUPLEX ULTRASOUND TECHNIQUE: Wallace Cullens scale imaging, color Doppler and duplex ultrasound were performed of bilateral carotid and vertebral arteries in the neck. COMPARISON:  None Available. FINDINGS: Criteria: Quantification of carotid stenosis is based on velocity parameters that correlate the residual internal carotid diameter with NASCET-based stenosis levels, using the diameter of the distal internal carotid lumen as the denominator for stenosis measurement. The following velocity measurements were obtained: RIGHT ICA:  Systolic 51 cm/sec, Diastolic 11 cm/sec CCA:  58 cm/sec SYSTOLIC ICA/CCA RATIO:  0.9 ECA:  52 cm/sec LEFT ICA:  Systolic 45 cm/sec, Diastolic 5 5 cm/sec CCA:  72 cm/sec SYSTOLIC ICA/CCA RATIO:  0.6 ECA:  32 cm/sec Right Brachial SBP: Not acquired Left Brachial SBP: Not acquired RIGHT CAROTID ARTERY: No significant calcifications of the right common carotid artery. Intermediate waveform maintained. Heterogeneous and partially calcified plaque at the right carotid bifurcation. No significant lumen shadowing. Low resistance waveform of the right ICA. No significant tortuosity. RIGHT VERTEBRAL ARTERY: Antegrade flow with low resistance waveform. LEFT CAROTID ARTERY: No significant calcifications  of the left common carotid artery. Intermediate waveform maintained. Heterogeneous and partially calcified plaque at the left carotid bifurcation. No significant lumen shadowing. Low resistance waveform of the left ICA. No significant tortuosity. LEFT VERTEBRAL ARTERY:  Antegrade flow with low resistance waveform. IMPRESSION: Color duplex indicates minimal heterogeneous and calcified plaque, with no hemodynamically significant stenosis by duplex criteria in the extracranial cerebrovascular circulation. Signed, Yvone Neu. Miachel Roux, RPVI [facility] [place] [facility] Electronically Signed   By: Gilmer Mor D.O.   On: 12/19/2022 07:29   ECHOCARDIOGRAM COMPLETE  Result Date: 12/18/2022    ECHOCARDIOGRAM REPORT   Patient Name:   Kathryn Beck Date of Exam: 12/18/2022 Medical Rec #:  [id number]       Height:       60.0 in Accession #:    [id number]      Weight:       185.8 lb Date of Birth:  Apr 11, 1955       BSA:          1.809 m Patient Age:    67 years        BP:           180/82 mmHg Patient Gender: F               HR:           76 bpm. [facility] Procedure: 2D Echo, Cardiac Doppler and Color Doppler Indications:    Stroke I63.9  History:        Patient has prior history of Echocardiogram examinations, most  Physician Discharge Summary  Kathryn Beck:[id number] DOB: 05-26-1955 DOA: 12/17/2022  PCP: Kathryn Perches, MD Neurologist: Dr. Amalia Beck  Admit date: 12/17/2022 Discharge date: 12/19/2022  Admitted From:  Home with HH Disposition:  Home with [facility]   Recommendations for Outpatient Follow-up:  Follow up with PCP in 1 weeks Follow up with neurologist in 2 weeks  Resume regular hemodialysis treatment schedule  Home Health: PT, SLP, Aide  Discharge Condition: STABLE   CODE STATUS: FULL DIET: renal    Brief Hospitalization Summary: Please see all hospital notes, images, labs for full details of the hospitalization. Admission Provider HPI: 67 y.o. female,  with medical history significant of acid reflux, depression, ESRD on HD, hyperlipidemia, hypertension, diabetes mellitus, memory loss, with severe PVD, status post bilateral AKA, on aspirin and Plavix, Plavix has been stopped last month per husband at bedside due to adverse side effects.   Pt presented status post fall at home, injury to the right eye, husband at bedside assists with the history, she is wheelchair dependent, she fell out of her chair yesterday when she was going from her wheelchair to a chair, she slipped, she fell, where right side of her face hit the ground, denies any other injuries, no loss of consciousness, she denies any dizziness, lightheadedness, husband at bedside patient has been more confused over the last few days, no fever, no chills, she received her full session of dialysis yesterday, with no complications.  ED course: patient received Tdap vaccine for her right eye injury, CT head, cervical spine, maxillofacial without contrast was significant for subacute left parietal cortical infarct within the posterior left MCA territory, So [facility] hospitalist consulted to admit   Hospital Course:  Pt was admitted for observation after a fall at home and had syncope work up. Her echo and carotid doppler testing was  reassuring and stable from prior testing.  No acute CVA on MRI only chronic findings which were reviewed by neurology.  She is feeling well back to baseline status and agreeable to resumption of home health services.  She had hemodialysis on 9/28 prior to discharge and advised to resume regular outpatient HD schedule.  Pt is discharging home in stable condition with recommendation for close outpatient follow up.     Fall at home - follow up PT/OT eval and recommendations - anticipate can discharge on 9/28 - continue fall precautions    Chronic CVA - MRI completed and consistent with chronic findings - reviewed with neurologist - continue aspirin daily  - pt reports she had adverse side effects from clopidogrel and was stopped 1 mos ago   PVD  - stable on home aspirin zetia   Essential hypertension - suboptimally controlled - resumed home meds   Hyperlipidemia - reports statin intolerance  - resumed on zetia    Hypokalemia - repleted    Type 2 DM with nephropathy, vascular complications - follow up A1c--7.7%  - resume some prandial and bolus insulin - SSI coverage ordered - CBG monitoring 5x per day in hospital  CBG (last 3)  Recent Labs    12/19/22 0305 12/19/22 0718 12/19/22 1115  GLUCAP 149* 116* 125*    ESRD on HD - appreciate nephrology consult for inpatient HD    Discharge Diagnoses:  Principal Problem:   CVA (cerebral vascular accident) (HCC) Active Problems:   Hyperlipidemia   Anemia of chronic disease   Chronic diastolic (congestive) heart failure (HCC)   HTN (hypertension)   Diabetes mellitus (HCC)   ESRD  tablets) by mouth with meals and 500mg  (1 tablet) with snacks               Durable Medical Equipment  (From admission, onward)           Start     Ordered   12/18/22 1530  For home use only DME Hospital bed  Once       Question Answer Comment  Length of Need Lifetime   The above medical condition requires: Patient requires the ability to reposition frequently   Bed type Semi-electric      12/18/22 1529            Procedures/Studies: US Carotid Bilateral (at [facility] only)  Result Date: 12/19/2022 CLINICAL DATA:  67 year old female with CVA EXAM: BILATERAL CAROTID DUPLEX ULTRASOUND TECHNIQUE: Wallace Cullens scale imaging, color Doppler and duplex ultrasound were performed of bilateral carotid and vertebral arteries in the neck. COMPARISON:  None Available. FINDINGS: Criteria: Quantification of carotid stenosis is based on velocity parameters that correlate the residual internal carotid diameter with NASCET-based stenosis levels, using the diameter of the distal internal carotid lumen as the denominator for stenosis measurement. The following velocity measurements were obtained: RIGHT ICA:  Systolic 51 cm/sec, Diastolic 11 cm/sec CCA:  58 cm/sec SYSTOLIC ICA/CCA RATIO:  0.9 ECA:  52 cm/sec LEFT ICA:  Systolic 45 cm/sec, Diastolic 5 5 cm/sec CCA:  72 cm/sec SYSTOLIC ICA/CCA RATIO:  0.6 ECA:  32 cm/sec Right Brachial SBP: Not acquired Left Brachial SBP: Not acquired RIGHT CAROTID ARTERY: No significant calcifications of the right common carotid artery. Intermediate waveform maintained. Heterogeneous and partially calcified plaque at the right carotid bifurcation. No significant lumen shadowing. Low resistance waveform of the right ICA. No significant tortuosity. RIGHT VERTEBRAL ARTERY: Antegrade flow with low resistance waveform. LEFT CAROTID ARTERY: No significant calcifications  of the left common carotid artery. Intermediate waveform maintained. Heterogeneous and partially calcified plaque at the left carotid bifurcation. No significant lumen shadowing. Low resistance waveform of the left ICA. No significant tortuosity. LEFT VERTEBRAL ARTERY:  Antegrade flow with low resistance waveform. IMPRESSION: Color duplex indicates minimal heterogeneous and calcified plaque, with no hemodynamically significant stenosis by duplex criteria in the extracranial cerebrovascular circulation. Signed, Yvone Neu. Miachel Roux, RPVI [facility] [place] [facility] Electronically Signed   By: Gilmer Mor D.O.   On: 12/19/2022 07:29   ECHOCARDIOGRAM COMPLETE  Result Date: 12/18/2022    ECHOCARDIOGRAM REPORT   Patient Name:   Kathryn Beck Date of Exam: 12/18/2022 Medical Rec #:  [id number]       Height:       60.0 in Accession #:    [id number]      Weight:       185.8 lb Date of Birth:  Apr 11, 1955       BSA:          1.809 m Patient Age:    67 years        BP:           180/82 mmHg Patient Gender: F               HR:           76 bpm. [facility] Procedure: 2D Echo, Cardiac Doppler and Color Doppler Indications:    Stroke I63.9  History:        Patient has prior history of Echocardiogram examinations, most  Physician Discharge Summary  Kathryn Beck:[id number] DOB: 05-26-1955 DOA: 12/17/2022  PCP: Kathryn Perches, MD Neurologist: Dr. Amalia Beck  Admit date: 12/17/2022 Discharge date: 12/19/2022  Admitted From:  Home with HH Disposition:  Home with [facility]   Recommendations for Outpatient Follow-up:  Follow up with PCP in 1 weeks Follow up with neurologist in 2 weeks  Resume regular hemodialysis treatment schedule  Home Health: PT, SLP, Aide  Discharge Condition: STABLE   CODE STATUS: FULL DIET: renal    Brief Hospitalization Summary: Please see all hospital notes, images, labs for full details of the hospitalization. Admission Provider HPI: 67 y.o. female,  with medical history significant of acid reflux, depression, ESRD on HD, hyperlipidemia, hypertension, diabetes mellitus, memory loss, with severe PVD, status post bilateral AKA, on aspirin and Plavix, Plavix has been stopped last month per husband at bedside due to adverse side effects.   Pt presented status post fall at home, injury to the right eye, husband at bedside assists with the history, she is wheelchair dependent, she fell out of her chair yesterday when she was going from her wheelchair to a chair, she slipped, she fell, where right side of her face hit the ground, denies any other injuries, no loss of consciousness, she denies any dizziness, lightheadedness, husband at bedside patient has been more confused over the last few days, no fever, no chills, she received her full session of dialysis yesterday, with no complications.  ED course: patient received Tdap vaccine for her right eye injury, CT head, cervical spine, maxillofacial without contrast was significant for subacute left parietal cortical infarct within the posterior left MCA territory, So [facility] hospitalist consulted to admit   Hospital Course:  Pt was admitted for observation after a fall at home and had syncope work up. Her echo and carotid doppler testing was  reassuring and stable from prior testing.  No acute CVA on MRI only chronic findings which were reviewed by neurology.  She is feeling well back to baseline status and agreeable to resumption of home health services.  She had hemodialysis on 9/28 prior to discharge and advised to resume regular outpatient HD schedule.  Pt is discharging home in stable condition with recommendation for close outpatient follow up.     Fall at home - follow up PT/OT eval and recommendations - anticipate can discharge on 9/28 - continue fall precautions    Chronic CVA - MRI completed and consistent with chronic findings - reviewed with neurologist - continue aspirin daily  - pt reports she had adverse side effects from clopidogrel and was stopped 1 mos ago   PVD  - stable on home aspirin zetia   Essential hypertension - suboptimally controlled - resumed home meds   Hyperlipidemia - reports statin intolerance  - resumed on zetia    Hypokalemia - repleted    Type 2 DM with nephropathy, vascular complications - follow up A1c--7.7%  - resume some prandial and bolus insulin - SSI coverage ordered - CBG monitoring 5x per day in hospital  CBG (last 3)  Recent Labs    12/19/22 0305 12/19/22 0718 12/19/22 1115  GLUCAP 149* 116* 125*    ESRD on HD - appreciate nephrology consult for inpatient HD    Discharge Diagnoses:  Principal Problem:   CVA (cerebral vascular accident) (HCC) Active Problems:   Hyperlipidemia   Anemia of chronic disease   Chronic diastolic (congestive) heart failure (HCC)   HTN (hypertension)   Diabetes mellitus (HCC)   ESRD  tablets) by mouth with meals and 500mg  (1 tablet) with snacks               Durable Medical Equipment  (From admission, onward)           Start     Ordered   12/18/22 1530  For home use only DME Hospital bed  Once       Question Answer Comment  Length of Need Lifetime   The above medical condition requires: Patient requires the ability to reposition frequently   Bed type Semi-electric      12/18/22 1529            Procedures/Studies: US Carotid Bilateral (at [facility] and [facility] only)  Result Date: 12/19/2022 CLINICAL DATA:  67 year old female with CVA EXAM: BILATERAL CAROTID DUPLEX ULTRASOUND TECHNIQUE: Wallace Cullens scale imaging, color Doppler and duplex ultrasound were performed of bilateral carotid and vertebral arteries in the neck. COMPARISON:  None Available. FINDINGS: Criteria: Quantification of carotid stenosis is based on velocity parameters that correlate the residual internal carotid diameter with NASCET-based stenosis levels, using the diameter of the distal internal carotid lumen as the denominator for stenosis measurement. The following velocity measurements were obtained: RIGHT ICA:  Systolic 51 cm/sec, Diastolic 11 cm/sec CCA:  58 cm/sec SYSTOLIC ICA/CCA RATIO:  0.9 ECA:  52 cm/sec LEFT ICA:  Systolic 45 cm/sec, Diastolic 5 5 cm/sec CCA:  72 cm/sec SYSTOLIC ICA/CCA RATIO:  0.6 ECA:  32 cm/sec Right Brachial SBP: Not acquired Left Brachial SBP: Not acquired RIGHT CAROTID ARTERY: No significant calcifications of the right common carotid artery. Intermediate waveform maintained. Heterogeneous and partially calcified plaque at the right carotid bifurcation. No significant lumen shadowing. Low resistance waveform of the right ICA. No significant tortuosity. RIGHT VERTEBRAL ARTERY: Antegrade flow with low resistance waveform. LEFT CAROTID ARTERY: No significant calcifications  of the left common carotid artery. Intermediate waveform maintained. Heterogeneous and partially calcified plaque at the left carotid bifurcation. No significant lumen shadowing. Low resistance waveform of the left ICA. No significant tortuosity. LEFT VERTEBRAL ARTERY:  Antegrade flow with low resistance waveform. IMPRESSION: Color duplex indicates minimal heterogeneous and calcified plaque, with no hemodynamically significant stenosis by duplex criteria in the extracranial cerebrovascular circulation. Signed, Yvone Neu. Miachel Roux, RPVI [facility] [place] [facility] Electronically Signed   By: Gilmer Mor D.O.   On: 12/19/2022 07:29   ECHOCARDIOGRAM COMPLETE  Result Date: 12/18/2022    ECHOCARDIOGRAM REPORT   Patient Name:   Kathryn Beck Date of Exam: 12/18/2022 Medical Rec #:  [id number]       Height:       60.0 in Accession #:    [id number]      Weight:       185.8 lb Date of Birth:  Apr 11, 1955       BSA:          1.809 m Patient Age:    67 years        BP:           180/82 mmHg Patient Gender: F               HR:           76 bpm. [facility] Procedure: 2D Echo, Cardiac Doppler and Color Doppler Indications:    Stroke I63.9  History:        Patient has prior history of Echocardiogram examinations, most  tablets) by mouth with meals and 500mg  (1 tablet) with snacks               Durable Medical Equipment  (From admission, onward)           Start     Ordered   12/18/22 1530  For home use only DME Hospital bed  Once       Question Answer Comment  Length of Need Lifetime   The above medical condition requires: Patient requires the ability to reposition frequently   Bed type Semi-electric      12/18/22 1529            Procedures/Studies: US Carotid Bilateral (at [facility] and [facility] only)  Result Date: 12/19/2022 CLINICAL DATA:  67 year old female with CVA EXAM: BILATERAL CAROTID DUPLEX ULTRASOUND TECHNIQUE: Wallace Cullens scale imaging, color Doppler and duplex ultrasound were performed of bilateral carotid and vertebral arteries in the neck. COMPARISON:  None Available. FINDINGS: Criteria: Quantification of carotid stenosis is based on velocity parameters that correlate the residual internal carotid diameter with NASCET-based stenosis levels, using the diameter of the distal internal carotid lumen as the denominator for stenosis measurement. The following velocity measurements were obtained: RIGHT ICA:  Systolic 51 cm/sec, Diastolic 11 cm/sec CCA:  58 cm/sec SYSTOLIC ICA/CCA RATIO:  0.9 ECA:  52 cm/sec LEFT ICA:  Systolic 45 cm/sec, Diastolic 5 5 cm/sec CCA:  72 cm/sec SYSTOLIC ICA/CCA RATIO:  0.6 ECA:  32 cm/sec Right Brachial SBP: Not acquired Left Brachial SBP: Not acquired RIGHT CAROTID ARTERY: No significant calcifications of the right common carotid artery. Intermediate waveform maintained. Heterogeneous and partially calcified plaque at the right carotid bifurcation. No significant lumen shadowing. Low resistance waveform of the right ICA. No significant tortuosity. RIGHT VERTEBRAL ARTERY: Antegrade flow with low resistance waveform. LEFT CAROTID ARTERY: No significant calcifications  of the left common carotid artery. Intermediate waveform maintained. Heterogeneous and partially calcified plaque at the left carotid bifurcation. No significant lumen shadowing. Low resistance waveform of the left ICA. No significant tortuosity. LEFT VERTEBRAL ARTERY:  Antegrade flow with low resistance waveform. IMPRESSION: Color duplex indicates minimal heterogeneous and calcified plaque, with no hemodynamically significant stenosis by duplex criteria in the extracranial cerebrovascular circulation. Signed, Yvone Neu. Miachel Roux, RPVI [facility] [place] [facility] Electronically Signed   By: Gilmer Mor D.O.   On: 12/19/2022 07:29   ECHOCARDIOGRAM COMPLETE  Result Date: 12/18/2022    ECHOCARDIOGRAM REPORT   Patient Name:   Kathryn Beck Date of Exam: 12/18/2022 Medical Rec #:  [id number]       Height:       60.0 in Accession #:    [id number]      Weight:       185.8 lb Date of Birth:  Apr 11, 1955       BSA:          1.809 m Patient Age:    67 years        BP:           180/82 mmHg Patient Gender: F               HR:           76 bpm. [facility] Procedure: 2D Echo, Cardiac Doppler and Color Doppler Indications:    Stroke I63.9  History:        Patient has prior history of Echocardiogram examinations, most

## 2022-12-19 NOTE — Discharge Instructions (Signed)
IMPORTANT INFORMATION: PAY CLOSE ATTENTION   PHYSICIAN DISCHARGE INSTRUCTIONS  Follow with Primary care provider  Kathryn Helper, MD  and other consultants as instructed by your Hospitalist Physician  [facility] IF SYMPTOMS COME BACK, WORSEN OR NEW PROBLEM DEVELOPS   Please note: You were cared for by a hospitalist during your hospital stay. Every effort will be made to forward records to your primary care provider.  You can request that your primary care provider send for your hospital records if they have not received them.  Once you are discharged, your primary care physician will handle any further medical issues. Please note that NO REFILLS for any discharge medications will be authorized once you are discharged, as it is imperative that you return to your primary care physician (or establish a relationship with a primary care physician if you do not have one) for your post hospital discharge needs so that they can reassess your need for medications and monitor your lab values.  Please get a complete blood count and chemistry panel checked by your Primary MD at your next visit, and again as instructed by your Primary MD.  Get Medicines reviewed and adjusted: Please take all your medications with you for your next visit with your Primary MD  Laboratory/radiological data: Please request your Primary MD to go over all hospital tests and procedure/radiological results at the follow up, please ask your primary care provider to get all Hospital records sent to his/her office.  In some cases, they will be blood work, cultures and biopsy results pending at the time of your discharge. Please request that your primary care provider follow up on these results.  If you are diabetic, please bring your blood sugar readings with you to your follow up appointment with primary care.    Please call and make your follow up appointments as soon as possible.    Also  Note the following: If you experience worsening of your admission symptoms, develop shortness of breath, life threatening emergency, suicidal or homicidal thoughts you must seek medical attention immediately by calling 911 or calling your MD immediately  if symptoms less severe.  You must read complete instructions/literature along with all the possible adverse reactions/side effects for all the Medicines you take and that have been prescribed to you. Take any new Medicines after you have completely understood and accpet all the possible adverse reactions/side effects.   Do not drive when taking Pain medications or sleeping medications (Benzodiazepines)  Do not take more than prescribed Pain, Sleep and Anxiety Medications. It is not advisable to combine anxiety,sleep and pain medications without talking with your primary care practitioner  Special Instructions: If you have smoked or chewed Tobacco  in the last 2 yrs please stop smoking, stop any regular Alcohol  and or any Recreational drug use.  Wear Seat belts while driving.  Do not drive if taking any narcotic, mind altering or controlled substances or recreational drugs or alcohol.

## 2022-12-20 DIAGNOSIS — S0591XA Unspecified injury of right eye and orbit, initial encounter: Secondary | ICD-10-CM | POA: Diagnosis not present

## 2022-12-20 LAB — GLUCOSE, CAPILLARY: Glucose-Capillary: 99 mg/dL (ref 70–99)

## 2022-12-20 NOTE — Progress Notes (Signed)
Patient and family state understanding of discharge instructions.  EMS here to transport patient home

## 2022-12-21 ENCOUNTER — Encounter: Payer: Self-pay | Admitting: Neurology

## 2022-12-21 DIAGNOSIS — N2581 Secondary hyperparathyroidism of renal origin: Secondary | ICD-10-CM | POA: Diagnosis not present

## 2022-12-21 DIAGNOSIS — N186 End stage renal disease: Secondary | ICD-10-CM | POA: Diagnosis not present

## 2022-12-21 DIAGNOSIS — Z992 Dependence on renal dialysis: Secondary | ICD-10-CM | POA: Diagnosis not present

## 2022-12-21 NOTE — Telephone Encounter (Signed)
Update- scheduled 10/10 at 1:40

## 2022-12-21 NOTE — Telephone Encounter (Signed)
Yes, urgent referral. Thanks

## 2022-12-21 NOTE — Telephone Encounter (Signed)
Oct 15 at 9:20 please scheduled

## 2022-12-22 ENCOUNTER — Encounter: Payer: Self-pay | Admitting: Neurology

## 2022-12-22 ENCOUNTER — Ambulatory Visit (INDEPENDENT_AMBULATORY_CARE_PROVIDER_SITE_OTHER): Payer: Medicare PPO | Admitting: Neurology

## 2022-12-22 ENCOUNTER — Telehealth: Payer: Self-pay

## 2022-12-22 ENCOUNTER — Other Ambulatory Visit: Payer: Medicare PPO

## 2022-12-22 VITALS — BP 172/78 | HR 94 | Ht 63.0 in | Wt 176.4 lb

## 2022-12-22 DIAGNOSIS — I63512 Cerebral infarction due to unspecified occlusion or stenosis of left middle cerebral artery: Secondary | ICD-10-CM | POA: Diagnosis not present

## 2022-12-22 DIAGNOSIS — G3184 Mild cognitive impairment, so stated: Secondary | ICD-10-CM

## 2022-12-22 MED ORDER — CLOPIDOGREL BISULFATE 75 MG PO TABS: 75.0000 mg | ORAL_TABLET | Freq: Every day | ORAL | 11 refills | Status: DC
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
## 2022-12-22 NOTE — Transitions of Care (Post Inpatient/ED Visit) (Signed)
12/22/2022  Name: Kathryn Beck MRN: [id number] DOB: [birth year]  Today's TOC FU Call Status: Today's TOC FU Call Status:: Successful TOC FU Call Completed TOC FU Call Complete Date: 12/22/22 Patient's Name and Date of Birth confirmed.  Transition Care Management Follow-up Telephone Call Date of Discharge: 12/20/22 [facility] Type of Discharge: Inpatient Admission Primary Inpatient Discharge Diagnosis:: cerebral infarction How have you been since you were released from the hospital?: Better Any questions or concerns?: No  Items Reviewed: Did you receive and understand the discharge instructions provided?: Yes Medications obtained,verified, and reconciled?: Yes (Medications Reviewed) Any new allergies since your discharge?: No Dietary orders reviewed?: Yes Do you have support at home?: Yes People in Home: child(ren), adult, spouse  Medications Reviewed Today: Medications Reviewed Today     Reviewed by Karena Addison, LPN (Licensed Practical Nurse) on 12/22/22 at 1207  Med List Status: <None>   Medication Order Taking? Sig Documenting Provider Last Dose Status Informant  amLODipine (NORVASC) 5 MG tablet [id number] No Take 1 tablet (5 mg total) by mouth daily. Anabel Halon, MD Taking Active Care Giver, Family Member           Med Note Elesa Massed, Dava Najjar Dec 17, 2022  6:22 PM)    aspirin 81 MG chewable tablet [id number] No Chew 2 tablets (162 mg total) by mouth daily. Cleora Fleet, MD Taking Active   B Complex-C-Folic Acid (RENA-VITE RX) 1 MG TABS [id number] No Take 1 tablet by mouth daily. [provider] Taking Active Care Giver, Family Member           Med Note Mayford Knife, Felton Clinton Nov 19, 2022 11:34 AM)    Blood Glucose Monitoring Suppl [facility] VERIO) w/Device KIT [id number] No Use to check blood sugar 4X daily. Carlus Pavlov, MD Taking Active Care Giver, Family Member  cinacalcet [facility]) 30 MG tablet [id number] No  Take 3 tablets (90 mg total) by mouth 3 (three) times a week. Merlene Laughter, DO Taking Active Care Giver, Family Member           Med Note Mayford Knife, [place] S   Thu Nov 19, 2022 11:34 AM)    clopidogrel (PLAVIX) 75 MG tablet [id number]  Take 1 tablet (75 mg total) by mouth daily. Windell Norfolk, MD  Active   Continuous Blood Gluc Receiver (FREESTYLE LIBRE 2 READER) DEVI [id number] No 1 each by Does not apply route daily. Carlus Pavlov, MD Taking Active Care Giver, Family Member  Continuous Blood Gluc Sensor (FREESTYLE LIBRE 2 SENSOR) [place] [id number] No 1 each by Does not apply route every 14 (fourteen) days. Carlus Pavlov, MD Taking Active Care Giver, Family Member  DULoxetine (CYMBALTA) 60 MG capsule [id number] No TAKE (1) CAPSULE BY MOUTH ONCE DAILY.  Patient taking differently: Take 60 mg by mouth daily.   [name], MD Taking Active Care Giver, Family Member  ezetimibe (ZETIA) 10 MG tablet [id number] No Take 1 tablet (10 mg total) by mouth daily. [name], MD Taking Active Care Giver, Family Member  FIASP FLEXTOUCH 100 UNIT/ML FlexTouch Pen [id number] No INJECT 18 TO 30 UNITS INTO THE SKIN WITH BREAKFAST, WITH LUNCH, AND WITH EVENING MEAL  Patient taking differently: Inject 18-30 Units into the skin 3 (three) times daily. Sliding scale   Carlus Pavlov, MD Taking Active Care Giver, Family Member           Med Note (WARD, Dava Najjar Dec 17, 2022  6:21 PM)    Discontinued 05/28/11 1302 (Patient has not taken in last 30 days)   Discontinued 05/28/11 1302 (Change in therapy)   glucose blood (ONETOUCH VERIO) test strip [id number] No Use as instructed to check blood sugar 4X daily. Carlus Pavlov, MD Taking Active Care Giver, Family Member  insulin glargine, 2 Unit Dial, (TOUJEO MAX SOLOSTAR) 300 UNIT/ML Solostar Pen [id number] No Inject 27 Units into the skin daily. May need to slowly increase the dose depending upon your blood sugar, follow-up with PCP Rondel Baton, MD Taking Active Care Giver, Family Member  Insulin Pen Needle 32G X 4 MM MISC [id number] No Use 4x a day Carlus Pavlov, MD Taking Active Care Giver, Family Member  metoprolol succinate (TOPROL-XL) 50 MG 24 hr tablet [id number] No TAKE (1) TABLET BY MOUTH DAILY WITH FOOD *TAKE AFTER DIALYSIS*  Patient taking differently: Take 50 mg by mouth See admin instructions. Take Tues, thurs, sat & sunday   [name], MD Taking Active Care Giver, Family Member           Med Note Haywood Lasso   FUX Jul 18, 2022  3:34 PM)    Dola Arg[place] Lancets 33G MISC [id number] No Use to check blood sugar 4X daily. Carlus Pavlov, MD Taking Active Care Giver, Family Member  torsemide [facility]) 100 MG tablet [id number] No Take 100 mg by mouth 2 (two) times daily. [provider] Taking Active Care Giver, Family Member  [name] 500 MG chewable tablet [id number] No Chew 500-1,000 mg by mouth See admin instructions. Take 1000mg  (2 tablets) by mouth with meals and 500mg  (1 tablet) with snacks [provider] Taking Active Care Giver, Family Member  Med List Note Rolene Course 07/18/22 1635): Patient has dialysis on MWF            Home Care and Equipment/Supplies: Were Home Health Services Ordered?: NA Any new equipment or medical supplies ordered?: NA  Functional Questionnaire: Do you need assistance with bathing/showering or dressing?: Yes Do you need assistance with meal preparation?: Yes Do you need assistance with eating?: No Do you have difficulty maintaining continence: No Do you need assistance with getting out of bed/getting out of a chair/moving?: Yes Do you have difficulty managing or taking your medications?: Yes  Follow up appointments reviewed: PCP Follow-up appointment confirmed?: Yes Date of PCP follow-up appointment?: 12/31/22 Follow-up Provider: [facility] Follow-up appointment confirmed?: Yes Date of Specialist follow-up  appointment?: 12/21/22 Follow-Up Specialty Provider:: neuro Do you need transportation to your follow-up appointment?: No Do you understand care options if your condition(s) worsen?: Yes-patient verbalized understanding    SIGNATURE Karena Addison, LPN [facility] Nurse Health Advisor Direct Dial [phone]

## 2022-12-22 NOTE — Patient Instructions (Signed)
Continue current medications Restart Plavix 75 mg daily, please monitor for adverse reaction 30-day cardiac monitor to rule out atrial fibrillation, stroke work up  Continue to follow-up with your doctors and dialysis Return as scheduled.

## 2022-12-23 DIAGNOSIS — Z992 Dependence on renal dialysis: Secondary | ICD-10-CM | POA: Diagnosis not present

## 2022-12-23 DIAGNOSIS — N2581 Secondary hyperparathyroidism of renal origin: Secondary | ICD-10-CM | POA: Diagnosis not present

## 2022-12-23 DIAGNOSIS — N186 End stage renal disease: Secondary | ICD-10-CM | POA: Diagnosis not present

## 2022-12-24 ENCOUNTER — Other Ambulatory Visit: Payer: Self-pay | Admitting: Family Medicine

## 2022-12-24 ENCOUNTER — Other Ambulatory Visit: Payer: Self-pay

## 2022-12-24 ENCOUNTER — Telehealth: Payer: Self-pay | Admitting: Family Medicine

## 2022-12-24 DIAGNOSIS — Z4781 Encounter for orthopedic aftercare following surgical amputation: Secondary | ICD-10-CM | POA: Diagnosis not present

## 2022-12-24 DIAGNOSIS — E1151 Type 2 diabetes mellitus with diabetic peripheral angiopathy without gangrene: Secondary | ICD-10-CM | POA: Diagnosis not present

## 2022-12-24 DIAGNOSIS — I132 Hypertensive heart and chronic kidney disease with heart failure and with stage 5 chronic kidney disease, or end stage renal disease: Secondary | ICD-10-CM | POA: Diagnosis not present

## 2022-12-24 DIAGNOSIS — I5032 Chronic diastolic (congestive) heart failure: Secondary | ICD-10-CM | POA: Diagnosis not present

## 2022-12-24 DIAGNOSIS — D631 Anemia in chronic kidney disease: Secondary | ICD-10-CM | POA: Diagnosis not present

## 2022-12-24 DIAGNOSIS — M62522 Muscle wasting and atrophy, not elsewhere classified, left upper arm: Secondary | ICD-10-CM | POA: Diagnosis not present

## 2022-12-24 DIAGNOSIS — E1122 Type 2 diabetes mellitus with diabetic chronic kidney disease: Secondary | ICD-10-CM | POA: Diagnosis not present

## 2022-12-24 DIAGNOSIS — M62521 Muscle wasting and atrophy, not elsewhere classified, right upper arm: Secondary | ICD-10-CM | POA: Diagnosis not present

## 2022-12-24 DIAGNOSIS — N186 End stage renal disease: Secondary | ICD-10-CM | POA: Diagnosis not present

## 2022-12-24 MED ORDER — VELPHORO 500 MG PO CHEW
500.0000 mg | CHEWABLE_TABLET | ORAL | 0 refills | Status: DC
Start: 2022-12-24 — End: 2023-08-23
  Filled 2022-12-24: qty 90, 15d supply, fill #0

## 2022-12-24 NOTE — Telephone Encounter (Signed)
Kathryn Beck [phone]) called in on patient behalf    Pt recently discharged from hospital   Orders for resumption of nursing care  1 x week 2 weeks

## 2022-12-24 NOTE — Telephone Encounter (Signed)
Verbal order given  

## 2022-12-25 DIAGNOSIS — Z992 Dependence on renal dialysis: Secondary | ICD-10-CM | POA: Diagnosis not present

## 2022-12-25 DIAGNOSIS — N186 End stage renal disease: Secondary | ICD-10-CM | POA: Diagnosis not present

## 2022-12-25 DIAGNOSIS — N2581 Secondary hyperparathyroidism of renal origin: Secondary | ICD-10-CM | POA: Diagnosis not present

## 2022-12-25 DIAGNOSIS — Z89511 Acquired absence of right leg below knee: Secondary | ICD-10-CM | POA: Diagnosis not present

## 2022-12-28 ENCOUNTER — Emergency Department (HOSPITAL_COMMUNITY): Payer: Medicare PPO

## 2022-12-28 ENCOUNTER — Other Ambulatory Visit: Payer: Self-pay

## 2022-12-28 ENCOUNTER — Ambulatory Visit: Payer: Self-pay | Admitting: Neurology

## 2022-12-28 ENCOUNTER — Emergency Department (HOSPITAL_COMMUNITY)
Admission: EM | Admit: 2022-12-28 | Discharge: 2022-12-28 | Disposition: A | Discharge status: Home / Self Care | Payer: Medicare PPO | Attending: Emergency Medicine | Admitting: Emergency Medicine

## 2022-12-28 DIAGNOSIS — I132 Hypertensive heart and chronic kidney disease with heart failure and with stage 5 chronic kidney disease, or end stage renal disease: Secondary | ICD-10-CM | POA: Diagnosis not present

## 2022-12-28 DIAGNOSIS — Z7902 Long term (current) use of antithrombotics/antiplatelets: Secondary | ICD-10-CM | POA: Insufficient documentation

## 2022-12-28 DIAGNOSIS — Z8673 Personal history of transient ischemic attack (TIA), and cerebral infarction without residual deficits: Secondary | ICD-10-CM | POA: Insufficient documentation

## 2022-12-28 DIAGNOSIS — I509 Heart failure, unspecified: Secondary | ICD-10-CM | POA: Insufficient documentation

## 2022-12-28 DIAGNOSIS — E1122 Type 2 diabetes mellitus with diabetic chronic kidney disease: Secondary | ICD-10-CM | POA: Insufficient documentation

## 2022-12-28 DIAGNOSIS — Z79899 Other long term (current) drug therapy: Secondary | ICD-10-CM | POA: Insufficient documentation

## 2022-12-28 DIAGNOSIS — Z992 Dependence on renal dialysis: Secondary | ICD-10-CM | POA: Diagnosis not present

## 2022-12-28 DIAGNOSIS — N250 Renal osteodystrophy: Secondary | ICD-10-CM | POA: Diagnosis not present

## 2022-12-28 DIAGNOSIS — N186 End stage renal disease: Secondary | ICD-10-CM | POA: Insufficient documentation

## 2022-12-28 DIAGNOSIS — Z794 Long term (current) use of insulin: Secondary | ICD-10-CM | POA: Diagnosis not present

## 2022-12-28 DIAGNOSIS — R0602 Shortness of breath: Secondary | ICD-10-CM | POA: Diagnosis not present

## 2022-12-28 DIAGNOSIS — E8779 Other fluid overload: Secondary | ICD-10-CM | POA: Diagnosis not present

## 2022-12-28 DIAGNOSIS — D631 Anemia in chronic kidney disease: Secondary | ICD-10-CM | POA: Diagnosis not present

## 2022-12-28 DIAGNOSIS — E8770 Fluid overload, unspecified: Principal | ICD-10-CM | POA: Diagnosis present

## 2022-12-28 DIAGNOSIS — R918 Other nonspecific abnormal finding of lung field: Secondary | ICD-10-CM | POA: Diagnosis not present

## 2022-12-28 DIAGNOSIS — I120 Hypertensive chronic kidney disease with stage 5 chronic kidney disease or end stage renal disease: Secondary | ICD-10-CM | POA: Diagnosis not present

## 2022-12-28 DIAGNOSIS — R410 Disorientation, unspecified: Secondary | ICD-10-CM | POA: Diagnosis not present

## 2022-12-28 DIAGNOSIS — I10 Essential (primary) hypertension: Secondary | ICD-10-CM | POA: Diagnosis not present

## 2022-12-28 DIAGNOSIS — Z7982 Long term (current) use of aspirin: Secondary | ICD-10-CM | POA: Insufficient documentation

## 2022-12-28 DIAGNOSIS — Z8616 Personal history of COVID-19: Secondary | ICD-10-CM | POA: Insufficient documentation

## 2022-12-28 DIAGNOSIS — R519 Headache, unspecified: Secondary | ICD-10-CM | POA: Insufficient documentation

## 2022-12-28 DIAGNOSIS — Z1152 Encounter for screening for COVID-19: Secondary | ICD-10-CM | POA: Diagnosis not present

## 2022-12-28 DIAGNOSIS — R059 Cough, unspecified: Secondary | ICD-10-CM | POA: Diagnosis not present

## 2022-12-28 LAB — COMPREHENSIVE METABOLIC PANEL
ALT: 42 U/L (ref 0–44)
AST: 17 U/L (ref 15–41)
Albumin: 3.1 g/dL — ABNORMAL LOW (ref 3.5–5.0)
Alkaline Phosphatase: 93 U/L (ref 38–126)
Anion gap: 13 (ref 5–15)
BUN: 60 mg/dL — ABNORMAL HIGH (ref 8–23)
CO2: 27 mmol/L (ref 22–32)
Calcium: 9.2 mg/dL (ref 8.9–10.3)
Chloride: 102 mmol/L (ref 98–111)
Creatinine, Ser: 6.73 mg/dL — ABNORMAL HIGH (ref 0.44–1.00)
GFR, Estimated: 6 mL/min — ABNORMAL LOW (ref >60)
Glucose, Bld: 211 mg/dL — ABNORMAL HIGH (ref 70–99)
Potassium: 3.7 mmol/L (ref 3.5–5.1)
Sodium: 142 mmol/L (ref 135–145)
Total Bilirubin: 0.6 mg/dL (ref 0.3–1.2)
Total Protein: 6.6 g/dL (ref 6.5–8.1)

## 2022-12-28 LAB — CBC WITH DIFFERENTIAL/PLATELET
Abs Immature Granulocytes: 0.08 10*3/uL — ABNORMAL HIGH (ref 0.00–0.07)
Basophils Absolute: 0 10*3/uL (ref 0.0–0.1)
Basophils Relative: 0 %
Eosinophils Absolute: 0 10*3/uL (ref 0.0–0.5)
Eosinophils Relative: 0 %
HCT: 36.2 % (ref 36.0–46.0)
Hemoglobin: 10.8 g/dL — ABNORMAL LOW (ref 12.0–15.0)
Immature Granulocytes: 1 %
Lymphocytes Relative: 6 %
Lymphs Abs: 0.8 10*3/uL (ref 0.7–4.0)
MCH: 23.9 pg — ABNORMAL LOW (ref 26.0–34.0)
MCHC: 29.8 g/dL — ABNORMAL LOW (ref 30.0–36.0)
MCV: 80.1 fL (ref 80.0–100.0)
Monocytes Absolute: 0.6 10*3/uL (ref 0.1–1.0)
Monocytes Relative: 5 %
Neutro Abs: 10.2 10*3/uL — ABNORMAL HIGH (ref 1.7–7.7)
Neutrophils Relative %: 88 %
Platelets: 262 10*3/uL (ref 150–400)
RBC: 4.52 MIL/uL (ref 3.87–5.11)
RDW: 19.8 % — ABNORMAL HIGH (ref 11.5–15.5)
WBC: 11.6 10*3/uL — ABNORMAL HIGH (ref 4.0–10.5)
nRBC: 0.5 % — ABNORMAL HIGH (ref 0.0–0.2)

## 2022-12-28 LAB — MAGNESIUM: Magnesium: 2.2 mg/dL (ref 1.7–2.4)

## 2022-12-28 LAB — BRAIN NATRIURETIC PEPTIDE: B Natriuretic Peptide: 2982 pg/mL — ABNORMAL HIGH (ref 0.0–100.0)

## 2022-12-28 LAB — SARS CORONAVIRUS 2 BY RT PCR: SARS Coronavirus 2 by RT PCR: NEGATIVE

## 2022-12-28 LAB — CBG MONITORING, ED: Glucose-Capillary: 205 mg/dL — ABNORMAL HIGH (ref 70–99)

## 2022-12-28 MED ORDER — SODIUM CHLORIDE 0.9 % IV SOLN
500.0000 mg | INTRAVENOUS | Status: DC
  Administered 2022-12-28: 500 mg via INTRAVENOUS
  Filled 2022-12-28: qty 5

## 2022-12-28 MED ORDER — METOPROLOL SUCCINATE ER 50 MG PO TB24: 50.0000 mg | ORAL_TABLET | ORAL | Status: DC

## 2022-12-28 MED ORDER — LIDOCAINE-PRILOCAINE 2.5-2.5 % EX CREA: 1.0000 | TOPICAL_CREAM | CUTANEOUS | Status: DC | PRN

## 2022-12-28 MED ORDER — ACETAMINOPHEN 500 MG PO TABS
1000.0000 mg | ORAL_TABLET | Freq: Once | ORAL | Status: AC
  Administered 2022-12-28: 1000 mg via ORAL
  Filled 2022-12-28: qty 2

## 2022-12-28 MED ORDER — LIDOCAINE HCL (PF) 1 % IJ SOLN: 5.0000 mL | INTRAMUSCULAR | Status: DC | PRN

## 2022-12-28 MED ORDER — AMLODIPINE BESYLATE 5 MG PO TABS
5.0000 mg | ORAL_TABLET | Freq: Every day | ORAL | Status: DC
  Administered 2022-12-28: 5 mg via ORAL
  Filled 2022-12-28: qty 1

## 2022-12-28 MED ORDER — NITROGLYCERIN 0.4 MG SL SUBL
0.4000 mg | SUBLINGUAL_TABLET | Freq: Once | SUBLINGUAL | Status: AC
  Administered 2022-12-28: 0.4 mg via SUBLINGUAL
  Filled 2022-12-28: qty 1

## 2022-12-28 MED ORDER — PENTAFLUOROPROP-TETRAFLUOROETH EX AERO: 1.0000 | INHALATION_SPRAY | CUTANEOUS | Status: DC | PRN

## 2022-12-28 MED ORDER — SODIUM CHLORIDE 0.9 % IV SOLN
1.0000 g | Freq: Once | INTRAVENOUS | Status: AC
  Administered 2022-12-28: 1 g via INTRAVENOUS
  Filled 2022-12-28: qty 10

## 2022-12-28 NOTE — ED Notes (Signed)
Patient transported to dialysis

## 2022-12-28 NOTE — ED Notes (Signed)
Notified MD of patients Blood Pressure continues to be elevated.

## 2022-12-28 NOTE — ED Notes (Signed)
Patients daughter took patients wheelchair home, would like to be updated with plan of care for patient post dialysis.

## 2022-12-28 NOTE — ED Triage Notes (Signed)
Pt BIB by POV complains of headaches. Denise vomiting but does state she has nausea. Patient was supposed to get dialysis today.

## 2022-12-28 NOTE — Progress Notes (Signed)
   HEMODIALYSIS TREATMENT NOTE:  Uneventful 3.5 hour heparin-free treatment completed using left upper arm AVF (15g/antegrade). Goal met: 3 liters removed without interruption in UF.  All blood was returned and hemostasis was achieved in 20 minutes.  Pt states she declined admission earlier today and wishes to discharge home - discussed with Dr. Gasper Sells.  Pt was transported from [facility] back to ED for pending discharge home.  Hand-off given to charge nurse Efraim Kaufmann, RN.    12/28/22 2055  Vitals  Temp 98 F (36.7 C)  Temp Source Oral  BP (!) 158/74  MAP (mmHg) 101  BP Location Right Arm  BP Method Automatic  Patient Position (if appropriate) Sitting  Pulse Rate 91  Pulse Rate Source Monitor  ECG Heart Rate 92  Resp 18  Oxygen Therapy  SpO2 99 %  O2 Device Room Air  Post Treatment  Dialyzer Clearance Lightly streaked  Hemodialysis Intake (mL) 0 mL  Liters Processed 76.6  Fluid Removed (mL) 3000 mL  Tolerated HD Treatment Yes  Post-Hemodialysis Comments Goal met  AVG/AVF Arterial Site Held (minutes) 7 minutes  AVG/AVF Venous Site Held (minutes) 7 minutes  Fistula / Graft Left Upper arm Arteriovenous fistula  Placement Date/Time: 09/02/17 1150   Placed prior to admission: Yes  Orientation: Left  Access Location: Upper arm  Access Type: Arteriovenous fistula  Site Condition No complications  Fistula / Graft Assessment Thrill;Bruit  Status Patent    Arman Filter, RN [facility]

## 2022-12-28 NOTE — ED Notes (Signed)
Restricted extremity armband placed on L wrist, L arm restricted due to fistula

## 2022-12-28 NOTE — Consult Note (Signed)
Kathryn Beck Admit Date: 12/28/2022 12/28/2022 Kathryn Beck Requesting Physician:  Jearld Fenton MD  Reason for Consult:  ESRD Comanagement HPI:  22F ESRD [facility] MWF using LUE AVF who this morning was on transportation to dialysis and refused to get off the bus and going to the unit.  She complained of a headache.  History is provided by family, the patient with her confusion is unable to provide much.  She has had progressive cognitive decline of late, being diagnosed with vascular dementia.  She has bilateral lower extremity amputations, using a Hoyer lift for transfer.  When she refused dialysis at the outpatient unit she has to be taken to the ED where she presented.  Here she mainly complained of a headache, blood pressures being elevated.  She is on room air.  Labs are notable for K3.7, BUN 60, bicarbonate 27, hemoglobin 10.8.  Portable chest x-ray with patchy heterogeneous opacities throughout the bilateral lung fields which could reflect edema or atypical infection, possible left lower lobe atelectasis or consolidation with pleural effusion.  No clear evidence of fever, productive cough.  WBC 11.6.  At the time of my evaluation the patient is in no distress.  She is on room air.  Family provides history.  PMH Incudes: Vascular dementia, history of CVA Hypertension DM2 Hyperlipidemia Progressive failure to thrive, living at home Bilateral lower extremity amputations  ROS Balance of 12 systems is negative w/ exceptions as above  PMH  Past Medical History:  Diagnosis Date   Acid reflux    Amputated left leg (HCC)    Anemia    Arthritis    Axillary masses    Soft tissue - status post excision   Back pain    Cancer (HCC)    CHF (congestive heart failure) (HCC)    COVID-19 virus infection 04/06/2019   Depression    End-stage renal disease (HCC)    M/W/F dialysis   Essential hypertension    Headache    years ago   History of blood transfusion    History of cardiac  catheterization    Normal coronary arteries October 2020   History of claustrophobia    History of pneumonia 2019   Hypoxia 04/03/2019   Memory loss    Mixed hyperlipidemia    Obesity    Pancreatitis    Peritoneal dialysis catheter in place [facility])    Pneumonia due to COVID-19 virus 04/02/2019   Sleep apnea    Noncompliant with CPAP   Stroke (HCC)    mini stroke   Type 2 diabetes mellitus (HCC)    PSH  Past Surgical History:  Procedure Laterality Date   ABDOMINAL AORTOGRAM W/LOWER EXTREMITY N/A 04/30/2022   Procedure: ABDOMINAL AORTOGRAM W/LOWER EXTREMITY;  Surgeon: Leonie Douglas, MD;  Location: [facility] INVASIVE CV LAB;  Service: Cardiovascular;  Laterality: N/A;   ABDOMINAL AORTOGRAM W/LOWER EXTREMITY N/A 07/21/2022   Procedure: ABDOMINAL AORTOGRAM W/LOWER EXTREMITY;  Surgeon: Nada Libman, MD;  Location: [facility] INVASIVE CV LAB;  Service: Cardiovascular;  Laterality: N/A;   ABDOMINAL HYSTERECTOMY     ACHILLES TENDON LENGTHENING  08/15/2022   Procedure: ACHILLES TENDON LENGTHENING;  Surgeon: [name], DPM;  Location: [facility] OR;  Service: Podiatry;;   AMPUTATION Right 05/29/2022   Procedure: RIGHT BELOW THE KNEE AMPUTATION;  Surgeon: [name], MD;  Location: [facility] OR;  Service: Orthopedics;  Laterality: Right;   AMPUTATION Left 09/04/2022   Procedure: AMPUTATION FOOT, serial irrigation;  Surgeon: Louann Sjogren, DPM;  Location:  [facility] OR;  Service: Podiatry;  Laterality: Left;  Surgical team to do block   AMPUTATION Left 10/07/2022   Procedure: LEFT BELOW KNEE AMPUTATION;  Surgeon: [name], MD;  Location[facility] OR;  Service: Orthopedics;  Laterality: Left;   AV FISTULA PLACEMENT Left 09/02/2017   Procedure: creation of left arm ARTERIOVENOUS (AV) FISTULA;  Surgeon: Nada Libman, MD;  Location: [facility] OR;  Service: Vascular;  Laterality: Left;   COLONOSCOPY  2008   Dr. Darrick Penna: normal    COLONOSCOPY N/A 12/18/2016   Dr. Darrick Penna: multiple tubular adenomas, internal hemorrhoids.  Surveillance in 3 years    ESOPHAGEAL DILATION N/A 10/13/2015   Procedure: ESOPHAGEAL DILATION;  Surgeon: Malissa Hippo, MD;  Location: [facility] ENDO SUITE;  Service: Endoscopy;  Laterality: N/A;   ESOPHAGOGASTRODUODENOSCOPY N/A 10/13/2015   Dr. Karilyn Cota: chronic gastritis on path, no H.pylori. Empiric dilation    ESOPHAGOGASTRODUODENOSCOPY N/A 12/18/2016   Dr. Darrick Penna: mild gastritis. BRAVO study revealed uncontrolled GERD. Dysphagia secondary to uncontrolled reflux   FOOT SURGERY Bilateral    "nerve"     LEFT HEART CATH AND CORONARY ANGIOGRAPHY N/A 12/29/2018   Procedure: LEFT HEART CATH AND CORONARY ANGIOGRAPHY;  Surgeon: Corky Crafts, MD;  Location[facility] INVASIVE CV LAB;  Service: Cardiovascular;  Laterality: N/A;   LOWER EXTREMITY ANGIOGRAPHY Right 05/04/2022   Procedure: Lower Extremity Angiography;  Surgeon: Victorino Sparrow, MD;  Location: [facility] INVASIVE CV LAB;  Service: Cardiovascular;  Laterality: Right;   LUNG BIOPSY     MASS EXCISION Right 01/09/2013   Procedure: EXCISION OF NEOPLASM OF RIGHT  AXILLA  AND EXCISION OF NEOPLASM OF LEFT AXILLA;  Surgeon: Dalia Heading, MD;  Location: [facility] ORS;  Service: General;  Laterality: Right;  procedure end @ 08:23   MYRINGOTOMY WITH TUBE PLACEMENT Bilateral 04/28/2017   Procedure: BILATERAL MYRINGOTOMY WITH TUBE PLACEMENT;  Surgeon: Newman Pies, MD;  Location: [facility] OR;  Service: ENT;  Laterality: Bilateral;   PERIPHERAL VASCULAR BALLOON ANGIOPLASTY Right 05/04/2022   Procedure: PERIPHERAL VASCULAR BALLOON ANGIOPLASTY;  Surgeon: Victorino Sparrow, MD;  Location: [facility] INVASIVE CV LAB;  Service: Cardiovascular;  Laterality: Right;  PT   PERIPHERAL VASCULAR INTERVENTION Right 05/04/2022   Procedure: PERIPHERAL VASCULAR INTERVENTION;  Surgeon: Victorino Sparrow, MD;  Location: [facility] INVASIVE CV LAB;  Service: Cardiovascular;  Laterality: Right;  SFA   PERIPHERAL VASCULAR INTERVENTION Left 07/21/2022   Procedure: PERIPHERAL VASCULAR INTERVENTION;  Surgeon: Nada Libman,  MD;  Location: [facility] INVASIVE CV LAB;  Service: Cardiovascular;  Laterality: Left;   REVISION OF ARTERIOVENOUS GORETEX GRAFT Left 05/04/2018   Procedure: TRANSPOSITION OF CEPHALIC VEIN ARTERIOVENOUS FISTULA LEFT ARM;  Surgeon: Larina Earthly, MD;  Location: [facility] OR;  Service: Vascular;  Laterality: Left;   SAVORY DILATION N/A 12/18/2016   Procedure: SAVORY DILATION;  Surgeon: West Bali, MD;  Location: [facility] ENDO SUITE;  Service: Endoscopy;  Laterality: N/A;   TRANSMETATARSAL AMPUTATION Left 08/15/2022   Procedure: TRANSMETATARSAL AMPUTATION;  Surgeon: [name], DPM;  Location: [facility] OR;  Service: Podiatry;  Laterality: Left;   FH  Family History  Problem Relation Age of Onset   Hypertension Father    Hypercholesterolemia Father    Arthritis Father    Hypertension Sister    Hypercholesterolemia Sister    Breast cancer Sister    Hypertension Sister    Colon cancer Neg Hx    Colon polyps Neg Hx    SH  reports that she has never smoked. She has never  been exposed to tobacco smoke. She has never used smokeless tobacco. She reports that she does not drink alcohol and does not use drugs. Allergies  Allergies  Allergen Reactions   Ace Inhibitors Anaphylaxis and Swelling   Penicillins Itching and Swelling   Statins Other (See Comments)    elevated LFT's     Albuterol Swelling   Home medications Prior to Admission medications   Medication Sig Start Date End Date Taking? Authorizing Provider  amLODipine (NORVASC) 5 MG tablet Take 1 tablet (5 mg total) by mouth daily. 11/12/22   Anabel Halon, MD  aspirin 81 MG chewable tablet Chew 2 tablets (162 mg total) by mouth daily. 12/19/22   Johnson, Clanford L, MD  B Complex-C-Folic Acid (RENA-VITE RX) 1 MG TABS Take 1 tablet by mouth daily. 04/15/22   [provider]  Blood Glucose Monitoring Suppl (ONETOUCH VERIO) w/Device KIT Use to check blood sugar 4X daily. 04/16/22   Carlus Pavlov, MD  cinacalcet (SENSIPAR) 30 MG tablet Take 3  tablets (90 mg total) by mouth 3 (three) times a week. 09/09/22   Marguerita Merles Latif, DO  clopidogrel (PLAVIX) 75 MG tablet Take 1 tablet (75 mg total) by mouth daily. 12/22/22   Windell Norfolk, MD  Continuous Blood Gluc Receiver (FREESTYLE LIBRE 2 READER) DEVI 1 each by Does not apply route daily. 04/16/22   Carlus Pavlov, MD  Continuous Blood Gluc Sensor (FREESTYLE LIBRE 2 SENSOR) MISC 1 each by Does not apply route every 14 (fourteen) days. 04/16/22   Carlus Pavlov, MD  DULoxetine (CYMBALTA) 60 MG capsule TAKE (1) CAPSULE BY MOUTH ONCE DAILY. Patient taking differently: Take 60 mg by mouth daily. 09/17/20   [name], MD  ezetimibe (ZETIA) 10 MG tablet Take 1 tablet (10 mg total) by mouth daily. 05/25/22   [name], MD  FIASP FLEXTOUCH 100 UNIT/ML FlexTouch Pen INJECT 18 TO 30 UNITS INTO THE SKIN WITH BREAKFAST, WITH LUNCH, AND WITH EVENING MEAL Patient taking differently: Inject 18-30 Units into the skin 3 (three) times daily. Sliding scale 10/20/22   Carlus Pavlov, MD  glucose blood (ONETOUCH VERIO) test strip Use as instructed to check blood sugar 4X daily. 04/16/22   Carlus Pavlov, MD  insulin glargine, 2 Unit Dial, (TOUJEO MAX SOLOSTAR) 300 UNIT/ML Solostar Pen Inject 27 Units into the skin daily. May need to slowly increase the dose depending upon your blood sugar, follow-up with PCP 11/20/22   Rondel Baton, MD  Insulin Pen Needle 32G X 4 MM MISC Use 4x a day 05/25/22   Carlus Pavlov, MD  metoprolol succinate (TOPROL-XL) 50 MG 24 hr tablet TAKE (1) TABLET BY MOUTH DAILY WITH FOOD *TAKE AFTER DIALYSIS* Patient taking differently: Take 50 mg by mouth See admin instructions. Take Tues, thurs, sat & sunday 07/29/21   [name], MD  OneTouch Delica Lancets 33G MISC Use to check blood sugar 4X daily. 04/16/22   Carlus Pavlov, MD  torsemide (DEMADEX) 100 MG tablet Take 100 mg by mouth 2 (two) times daily. 12/01/22   [provider]  [name]  500 MG chewable tablet Chew 1-2 tablets (500-1,000 mg total) by mouth See admin instructions. Take 1000mg  (2 tablets) by mouth with meals and 500mg  (1 tablet) with snacks 12/24/22   [name], MD  FLUoxetine (PROZAC) 10 MG capsule Take 10 mg by mouth daily.    05/28/11  [provider]  glipiZIDE (GLUCOTROL) 10 MG tablet Take 10 mg by mouth 2 (two) times  daily before a meal.    05/28/11  [provider]    Current Medications Scheduled Meds: Continuous Infusions:  azithromycin     cefTRIAXone (ROCEPHIN)  IV     PRN Meds:.  CBC Recent Labs  Lab 12/28/22 0842  WBC 11.6*  NEUTROABS 10.2*  HGB 10.8*  HCT 36.2  MCV 80.1  PLT 262   Basic Metabolic Panel Recent Labs  Lab 12/28/22 0842  NA 142  K 3.7  CL 102  CO2 27  GLUCOSE 211*  BUN 60*  CREATININE 6.73*  CALCIUM 9.2    Physical Exam  Blood pressure (!) 195/88, pulse 85, temperature 97.9 F (36.6 C), temperature source Oral, resp. rate 12, height 5' (1.524 m), SpO2 97%. GEN: Elderly female, confused, no acute distress ENT: NCAT EYES: EOMI CV: Regular, normal S1 and S2 PULM: Clear bilaterally, diminished in the bases ABD: Soft, nontender VASCULAR: LUE AVF +B/T NEURO: confused, nonfocal  From Recent Admission: Dialysis Orders: Center:  MWF at [facility] 3:45hr, 400/500, EDW 80g, 2K/2.5Ca, UFP#2, LUE AVF 15g, heparin 1000 unit initial + 1000 unit/hr Meds: Mircera 60 mcg every 2 weeks, Venofer 50 mg once weekly, calcitriol 3 mcg every treatment, Sensipar 90 mg every treatment  Assessment 37F ESRD MWF [facility] here with confusion, HA, HTN  ESRD MWF LUE AVF: HD today on schedule, 2-3L UF, Confusion /vascular dementia: Waxing and waning Hypertension, acutely elevated, follow with HD today Question of pneumonia: Per EDP Anemia of CKD: At goal CKD BMD: No current active issues, continue outpatient management LUE AVF  Plan HD today on schedule: 3.5h, K per protocol, 2-3L UF, use  AVF Daily weights, Daily Renal Panel, Strict I/Os, Avoid nephrotoxins (NSAIDs, judicious IV Contrast) WIll follow along   Kathryn Beck  12/28/2022, 10:45 AM

## 2022-12-28 NOTE — ED Notes (Signed)
Unable to obtain IV access on R arm, will ask RN certified in [place] IV to attempt to obtain IV access

## 2022-12-28 NOTE — ED Provider Notes (Addendum)
Care of patient assumed from Dr. Jearld Fenton.  This patient presents with headache.  This did cause her to miss her dialysis today.  She is scheduled to undergo dialysis from the ED this afternoon.  Plan will be for return to ED for reassessment.  She was offered admission but declined. Physical Exam  BP (!) 168/70 (BP Location: Right Arm)   Pulse 98   Temp 98 F (36.7 C)   Resp 20   Ht 5' (1.524 m)   SpO2 97%   BMI 34.44 kg/m   Physical Exam Vitals and nursing note reviewed.  Constitutional:      General: She is not in acute distress.    Appearance: She is well-developed. She is not ill-appearing, toxic-appearing or diaphoretic.  HENT:     Head: Normocephalic and atraumatic.     Mouth/Throat:     Mouth: Mucous membranes are moist.  Eyes:     Conjunctiva/sclera: Conjunctivae normal.  Cardiovascular:     Rate and Rhythm: Normal rate and regular rhythm.  Pulmonary:     Effort: Pulmonary effort is normal. No respiratory distress.  Abdominal:     General: There is no distension.  Musculoskeletal:     Cervical back: Normal range of motion and neck supple.  Skin:    General: Skin is warm and dry.     Procedures  Procedures  ED Course / MDM   Clinical Course as of 12/28/22 1611  Mon Dec 28, 2022  1478 Glucose-Capillary(!): 205 [HN]  0840 DG Chest Portable 1 View 1. Patchy heterogeneous opacities throughout bilateral lungs with mild bilateral hilar prominence. Findings may reflect pulmonary edema or atypical infection. 2. Left lower lobe atelectasis and/or consolidation with pleural effusion.   [HN]  0840 BP(!): 210/86 Likely in s/o needing dialysis and volume overload, will treat with SL nitroglycerin [HN]  0920 SARS Coronavirus 2 by RT PCR: NEGATIVE [HN]  0940 Electrolytes okay [HN]  0940 Consulting to nephrology [HN]  0946 WBC(!): 11.6 +Leukocytosis w/ left shift [HN]  0947 Will give ceftriaxone/azithromycin for LLL PNA concern [HN]    Clinical Course User Index [HN]  Loetta Rough, MD   Medical Decision Making Amount and/or Complexity of Data Reviewed Labs: ordered. Decision-making details documented in ED Course. Radiology: ordered. Decision-making details documented in ED Course.  Risk OTC drugs. Prescription drug management. Decision regarding hospitalization.   On assessment, patient sleeping.  Blood pressure remains moderately elevated but has improved from time of her arrival.  Daughter is at bedside.  Daughter reported that headache had improved.  Daughter now requesting admission.  Given her elevated BNP and possible need for additional dialysis after session today, I feel this is reasonable.  Patient was admitted for further management.  After her dialysis session, however, patient stated that she was unwilling to stay in the hospital.  She did have sustained improvement in her blood pressure.  She had sustained resolution of her headache.  Breathing is unlabored and SpO2 is normal on room air.  Patient return to the ED and was discharged in stable condition.       Gloris Manchester, MD 12/28/22 1703    Gloris Manchester, MD 12/28/22 2122

## 2022-12-28 NOTE — ED Provider Notes (Signed)
[place] EMERGENCY DEPARTMENT AT [facility] Provider Note   CSN: [id number] Arrival date & time: 12/28/22  9528     History {Add pertinent medical, surgical, social history, OB history to HPI:1} Chief Complaint  Patient presents with   Headache    RHODIA ACRES is a 67 y.o. female with PMH as listed below who is BIB by POV by her husband complains of cough, shortness of breath, headache.  Patient states that she had a gradual onset bilateral frontal headache that began yesterday, now rated 8 out of 10.  Not associated with any visual changes or focal neurodeficits.  Patient states she has had a headache like this before.  She has not tried anything for the pain.  This morning began to have cough productive of "dark" nonbloody sputum as well as mild shortness of breath.  Denies any sore throat or nasal congestion.  No known sick contacts.  Denise vomiting but does state she has nausea. Patient was supposed to get dialysis today.  Last dialysis was Friday and got a full session.  Husband states that last week patient was evaluated for memory difficulties and intermittent staring episodes, had MRI that demonstrated a subacute ischemic CVA.  He states that currently she is acting at her new baseline with no acute changes.  Past Medical History:  Diagnosis Date   Acid reflux    Amputated left leg (HCC)    Anemia    Arthritis    Axillary masses    Soft tissue - status post excision   Back pain    Cancer (HCC)    CHF (congestive heart failure) (HCC)    COVID-19 virus infection 04/06/2019   Depression    End-stage renal disease (HCC)    M/W/F dialysis   Essential hypertension    Headache    years ago   History of blood transfusion    History of cardiac catheterization    Normal coronary arteries October 2020   History of claustrophobia    History of pneumonia 2019   Hypoxia 04/03/2019   Memory loss    Mixed hyperlipidemia    Obesity    Pancreatitis    Peritoneal  dialysis catheter in place [facility]    Pneumonia due to COVID-19 virus 04/02/2019   Sleep apnea    Noncompliant with CPAP   Stroke (HCC)    mini stroke   Type 2 diabetes mellitus (HCC)        Home Medications Prior to Admission medications   Medication Sig Start Date End Date Taking? Authorizing Provider  amLODipine (NORVASC) 5 MG tablet Take 1 tablet (5 mg total) by mouth daily. 11/12/22   Anabel Halon, MD  aspirin 81 MG chewable tablet Chew 2 tablets (162 mg total) by mouth daily. 12/19/22   Johnson, Clanford L, MD  B Complex-C-Folic Acid (RENA-VITE RX) 1 MG TABS Take 1 tablet by mouth daily. 04/15/22   [provider]  Blood Glucose Monitoring Suppl (ONETOUCH VERIO) w/Device KIT Use to check blood sugar 4X daily. 04/16/22   Carlus Pavlov, MD  cinacalcet (SENSIPAR) 30 MG tablet Take 3 tablets (90 mg total) by mouth 3 (three) times a week. 09/09/22   Marguerita Merles Latif, DO  clopidogrel (PLAVIX) 75 MG tablet Take 1 tablet (75 mg total) by mouth daily. 12/22/22   Windell Norfolk, MD  Continuous Blood Gluc Receiver (FREESTYLE LIBRE 2 READER) DEVI 1 each by Does not apply route daily. 04/16/22   Carlus Pavlov, MD  Continuous  Blood Gluc Sensor (FREESTYLE LIBRE 2 SENSOR) MISC 1 each by Does not apply route every 14 (fourteen) days. 04/16/22   Carlus Pavlov, MD  DULoxetine (CYMBALTA) 60 MG capsule TAKE (1) CAPSULE BY MOUTH ONCE DAILY. Patient taking differently: Take 60 mg by mouth daily. 09/17/20   [name], MD  ezetimibe (ZETIA) 10 MG tablet Take 1 tablet (10 mg total) by mouth daily. 05/25/22   [name], MD  FIASP FLEXTOUCH 100 UNIT/ML FlexTouch Pen INJECT 18 TO 30 UNITS INTO THE SKIN WITH BREAKFAST, WITH LUNCH, AND WITH EVENING MEAL Patient taking differently: Inject 18-30 Units into the skin 3 (three) times daily. Sliding scale 10/20/22   Carlus Pavlov, MD  glucose blood (ONETOUCH VERIO) test strip Use as instructed to check blood sugar 4X daily. 04/16/22    Carlus Pavlov, MD  insulin glargine, 2 Unit Dial, (TOUJEO MAX SOLOSTAR) 300 UNIT/ML Solostar Pen Inject 27 Units into the skin daily. May need to slowly increase the dose depending upon your blood sugar, follow-up with PCP 11/20/22   Rondel Baton, MD  Insulin Pen Needle 32G X 4 MM MISC Use 4x a day 05/25/22   Carlus Pavlov, MD  metoprolol succinate (TOPROL-XL) 50 MG 24 hr tablet TAKE (1) TABLET BY MOUTH DAILY WITH FOOD *TAKE AFTER DIALYSIS* Patient taking differently: Take 50 mg by mouth See admin instructions. Take Tues, thurs, sat & sunday 07/29/21   [name], MD  OneTouch Delica Lancets 33G MISC Use to check blood sugar 4X daily. 04/16/22   Carlus Pavlov, MD  torsemide (DEMADEX) 100 MG tablet Take 100 mg by mouth 2 (two) times daily. 12/01/22   [provider]  [name] 500 MG chewable tablet Chew 1-2 tablets (500-1,000 mg total) by mouth See admin instructions. Take 1000mg  (2 tablets) by mouth with meals and 500mg  (1 tablet) with snacks 12/24/22   [name], MD  FLUoxetine (PROZAC) 10 MG capsule Take 10 mg by mouth daily.    05/28/11  [provider]  glipiZIDE (GLUCOTROL) 10 MG tablet Take 10 mg by mouth 2 (two) times daily before a meal.    05/28/11  [provider]      Allergies    Ace inhibitors, Penicillins, Statins, and Albuterol    Review of Systems   Review of Systems A 10 point review of systems was performed and is negative unless otherwise reported in HPI.  Physical Exam Updated Vital Signs BP (!) 162/92 (BP Location: Right Arm)   Pulse 91   Temp 97.9 F (36.6 C) (Oral)   Resp 18   Ht 5' (1.524 m)   SpO2 96%   BMI 34.44 kg/m  Physical Exam General: Chronically ill appearing female, lying in bed.  HEENT: PERRLA, Sclera anicteric, MMM, trachea midline. No cervical lymphadenopathy.  Neck supple. Cardiology: RRR, no murmurs/rubs/gallops.  Resp: Mildly increased respiratory rate and effort. CTAB, no wheezes, rhonchi,  crackles.  Abd: Soft, non-tender, non-distended. No rebound tenderness or guarding.  GU: Deferred. MSK: Status post bilateral BKA.  No peripheral edema or signs of trauma. Extremities without deformity or TTP. No cyanosis or clubbing. Skin: warm, dry.  Neuro: A&Ox4, CNs II-XII grossly intact. MAEs. Sensation grossly intact.  Psych: Normal mood and affect.   ED Results / Procedures / Treatments   Labs (all labs ordered are listed, but only abnormal results are displayed) Labs Reviewed  SARS CORONAVIRUS 2 BY RT PCR  CBC WITH DIFFERENTIAL/PLATELET  COMPREHENSIVE METABOLIC PANEL  MAGNESIUM  CBG MONITORING,  ED    EKG None  Radiology No results found.  Procedures Procedures  {Document cardiac monitor, telemetry assessment procedure when appropriate:1}  Medications Ordered in ED Medications  acetaminophen (TYLENOL) tablet 1,000 mg (has no administration in time range)    ED Course/ Medical Decision Making/ A&P                          Medical Decision Making Amount and/or Complexity of Data Reviewed Labs: ordered. Radiology: ordered.  Risk OTC drugs.    This patient presents to the ED for concern of cough/shortness of breath/headache, this involves an extensive number of treatment options, and is a complaint that carries with it a high risk of complications and morbidity.  I considered the following differential and admission for this acute, potentially life threatening condition.   MDM:    Consider viral URI, such as COVID, or bronchitis.  Consider pneumonia, pulmonary edema or volume overload.  Consider electrolyte derangements, volume overload, need for dialysis.  She is not febrile or tachycardic, does not appear acutely toxic, is moving her head and turning her neck freely, lower concern for serious bacterial infection such as meningitis or bacteremia. She states she has had headache like this before, no thunderclap headache or FNDs, no high risk features, does not  need head imaging at this time but will give tylenol and reevaluate. Likely due to non-emergent etiology such as viral syndrome, tension headache.      Labs: I Ordered, and personally interpreted labs.  The pertinent results include:  those listed above  Imaging Studies ordered: I ordered imaging studies including CXR I independently visualized and interpreted imaging. I agree with the radiologist interpretation  Additional history obtained from husband at bedside, chart review.    Cardiac Monitoring: The patient was maintained on a cardiac monitor.  I personally viewed and interpreted the cardiac monitored which showed an underlying rhythm of: Normal sinus rhythm  Reevaluation: After the interventions noted above, I reevaluated the patient and found that they have :{resolved/improved/worsened:23923::"improved"}  Social Determinants of Health:  lives with husband  Disposition:  ***  Co morbidities that complicate the patient evaluation  Past Medical History:  Diagnosis Date   Acid reflux    Amputated left leg (HCC)    Anemia    Arthritis    Axillary masses    Soft tissue - status post excision   Back pain    Cancer (HCC)    CHF (congestive heart failure) (HCC)    COVID-19 virus infection 04/06/2019   Depression    End-stage renal disease (HCC)    M/W/F dialysis   Essential hypertension    Headache    years ago   History of blood transfusion    History of cardiac catheterization    Normal coronary arteries October 2020   History of claustrophobia    History of pneumonia 2019   Hypoxia 04/03/2019   Memory loss    Mixed hyperlipidemia    Obesity    Pancreatitis    Peritoneal dialysis catheter in place [facility])    Pneumonia due to COVID-19 virus 04/02/2019   Sleep apnea    Noncompliant with CPAP   Stroke (HCC)    mini stroke   Type 2 diabetes mellitus (HCC)      Medicines Meds ordered this encounter  Medications   acetaminophen (TYLENOL) tablet 1,000 mg     I have reviewed the patients home medicines and have made adjustments as needed  Problem List / ED Course: Problem List Items Addressed This Visit   None        {Document critical care time when appropriate:1} {Document review of labs and clinical decision tools ie heart score, Chads2Vasc2 etc:1}  {Document your independent review of radiology images, and any outside records:1} {Document your discussion with family members, caretakers, and with consultants:1} {Document social determinants of health affecting pt's care:1} {Document your decision making why or why not admission, treatments were needed:1}  This note was created using dictation software, which may contain spelling or grammatical errors.

## 2022-12-28 NOTE — ED Notes (Signed)
Spoke to [employer]. Daughter is going to pick up pt

## 2022-12-28 NOTE — ED Notes (Signed)
Per MD patient ok to eat, and have small sips of fluids. Monitor fluid intake.

## 2022-12-29 DIAGNOSIS — N186 End stage renal disease: Secondary | ICD-10-CM | POA: Diagnosis not present

## 2022-12-29 DIAGNOSIS — R296 Repeated falls: Secondary | ICD-10-CM | POA: Diagnosis not present

## 2022-12-29 DIAGNOSIS — I5032 Chronic diastolic (congestive) heart failure: Secondary | ICD-10-CM | POA: Diagnosis not present

## 2022-12-29 DIAGNOSIS — Z4781 Encounter for orthopedic aftercare following surgical amputation: Secondary | ICD-10-CM | POA: Diagnosis not present

## 2022-12-29 DIAGNOSIS — Z992 Dependence on renal dialysis: Secondary | ICD-10-CM | POA: Diagnosis not present

## 2022-12-29 DIAGNOSIS — Z89511 Acquired absence of right leg below knee: Secondary | ICD-10-CM | POA: Diagnosis not present

## 2022-12-29 DIAGNOSIS — E1140 Type 2 diabetes mellitus with diabetic neuropathy, unspecified: Secondary | ICD-10-CM | POA: Diagnosis not present

## 2022-12-29 DIAGNOSIS — M6281 Muscle weakness (generalized): Secondary | ICD-10-CM | POA: Diagnosis not present

## 2022-12-29 DIAGNOSIS — Z89512 Acquired absence of left leg below knee: Secondary | ICD-10-CM | POA: Diagnosis not present

## 2022-12-29 DIAGNOSIS — R2689 Other abnormalities of gait and mobility: Secondary | ICD-10-CM | POA: Diagnosis not present

## 2022-12-30 ENCOUNTER — Other Ambulatory Visit: Payer: Self-pay

## 2022-12-30 DIAGNOSIS — Z992 Dependence on renal dialysis: Secondary | ICD-10-CM | POA: Diagnosis not present

## 2022-12-30 DIAGNOSIS — N2581 Secondary hyperparathyroidism of renal origin: Secondary | ICD-10-CM | POA: Diagnosis not present

## 2022-12-30 DIAGNOSIS — N186 End stage renal disease: Secondary | ICD-10-CM | POA: Diagnosis not present

## 2022-12-31 ENCOUNTER — Ambulatory Visit: Payer: Medicare PPO | Admitting: Orthopedic Surgery

## 2022-12-31 ENCOUNTER — Ambulatory Visit (INDEPENDENT_AMBULATORY_CARE_PROVIDER_SITE_OTHER): Payer: Medicare PPO | Admitting: Family Medicine

## 2022-12-31 ENCOUNTER — Encounter: Payer: Self-pay | Admitting: Family Medicine

## 2022-12-31 VITALS — BP 172/80 | Ht 63.0 in | Wt 176.0 lb

## 2022-12-31 DIAGNOSIS — Y92009 Unspecified place in unspecified non-institutional (private) residence as the place of occurrence of the external cause: Secondary | ICD-10-CM

## 2022-12-31 DIAGNOSIS — I6930 Unspecified sequelae of cerebral infarction: Secondary | ICD-10-CM

## 2022-12-31 DIAGNOSIS — Z89511 Acquired absence of right leg below knee: Secondary | ICD-10-CM | POA: Diagnosis not present

## 2022-12-31 DIAGNOSIS — I10 Essential (primary) hypertension: Secondary | ICD-10-CM | POA: Diagnosis not present

## 2022-12-31 DIAGNOSIS — N186 End stage renal disease: Secondary | ICD-10-CM | POA: Diagnosis not present

## 2022-12-31 DIAGNOSIS — W19XXXD Unspecified fall, subsequent encounter: Secondary | ICD-10-CM

## 2022-12-31 DIAGNOSIS — E1122 Type 2 diabetes mellitus with diabetic chronic kidney disease: Secondary | ICD-10-CM | POA: Diagnosis not present

## 2022-12-31 DIAGNOSIS — E782 Mixed hyperlipidemia: Secondary | ICD-10-CM | POA: Diagnosis not present

## 2022-12-31 DIAGNOSIS — I639 Cerebral infarction, unspecified: Secondary | ICD-10-CM

## 2022-12-31 DIAGNOSIS — Z794 Long term (current) use of insulin: Secondary | ICD-10-CM | POA: Diagnosis not present

## 2022-12-31 DIAGNOSIS — Z992 Dependence on renal dialysis: Secondary | ICD-10-CM

## 2022-12-31 DIAGNOSIS — Z7689 Persons encountering health services in other specified circumstances: Secondary | ICD-10-CM

## 2022-12-31 DIAGNOSIS — Z89512 Acquired absence of left leg below knee: Secondary | ICD-10-CM

## 2022-12-31 NOTE — Patient Instructions (Addendum)
Please re schedule 10/24 follow up to early January, call if you need me sooner  [employer], pls remove the mobile number as a contact number, pt does not answer, thanks  Please schedule wellness visit at checkout  All the appointments that you need are already in place except for the one about your blood pressure    You do NEED to go to the advanced hypertension clinic in [place] , I have referred you again and have asked that they contact your daughter with appointment info  Best for 2024/2025!  Thanks for choosing [facility], we consider it a privelige to serve you.

## 2023-01-01 ENCOUNTER — Other Ambulatory Visit: Payer: Self-pay | Admitting: Internal Medicine

## 2023-01-01 ENCOUNTER — Other Ambulatory Visit: Payer: Self-pay | Admitting: Family Medicine

## 2023-01-01 DIAGNOSIS — E1122 Type 2 diabetes mellitus with diabetic chronic kidney disease: Secondary | ICD-10-CM

## 2023-01-01 DIAGNOSIS — M62522 Muscle wasting and atrophy, not elsewhere classified, left upper arm: Secondary | ICD-10-CM | POA: Diagnosis not present

## 2023-01-01 DIAGNOSIS — E1151 Type 2 diabetes mellitus with diabetic peripheral angiopathy without gangrene: Secondary | ICD-10-CM | POA: Diagnosis not present

## 2023-01-01 DIAGNOSIS — I5032 Chronic diastolic (congestive) heart failure: Secondary | ICD-10-CM | POA: Diagnosis not present

## 2023-01-01 DIAGNOSIS — N186 End stage renal disease: Secondary | ICD-10-CM | POA: Diagnosis not present

## 2023-01-01 DIAGNOSIS — N2581 Secondary hyperparathyroidism of renal origin: Secondary | ICD-10-CM | POA: Diagnosis not present

## 2023-01-01 DIAGNOSIS — Z4781 Encounter for orthopedic aftercare following surgical amputation: Secondary | ICD-10-CM | POA: Diagnosis not present

## 2023-01-01 DIAGNOSIS — M62521 Muscle wasting and atrophy, not elsewhere classified, right upper arm: Secondary | ICD-10-CM | POA: Diagnosis not present

## 2023-01-01 DIAGNOSIS — D631 Anemia in chronic kidney disease: Secondary | ICD-10-CM | POA: Diagnosis not present

## 2023-01-01 DIAGNOSIS — I132 Hypertensive heart and chronic kidney disease with heart failure and with stage 5 chronic kidney disease, or end stage renal disease: Secondary | ICD-10-CM | POA: Diagnosis not present

## 2023-01-01 DIAGNOSIS — Z992 Dependence on renal dialysis: Secondary | ICD-10-CM | POA: Diagnosis not present

## 2023-01-04 DIAGNOSIS — N2581 Secondary hyperparathyroidism of renal origin: Secondary | ICD-10-CM | POA: Diagnosis not present

## 2023-01-04 DIAGNOSIS — N186 End stage renal disease: Secondary | ICD-10-CM | POA: Diagnosis not present

## 2023-01-04 DIAGNOSIS — Z992 Dependence on renal dialysis: Secondary | ICD-10-CM | POA: Diagnosis not present

## 2023-01-04 DIAGNOSIS — E1122 Type 2 diabetes mellitus with diabetic chronic kidney disease: Secondary | ICD-10-CM | POA: Diagnosis not present

## 2023-01-05 ENCOUNTER — Other Ambulatory Visit: Payer: Self-pay

## 2023-01-05 MED ORDER — METOPROLOL SUCCINATE ER 50 MG PO TB24: ORAL_TABLET | ORAL | 11 refills | Status: DC

## 2023-01-06 ENCOUNTER — Encounter: Payer: Self-pay | Admitting: Orthopedic Surgery

## 2023-01-06 DIAGNOSIS — N186 End stage renal disease: Secondary | ICD-10-CM | POA: Diagnosis not present

## 2023-01-06 DIAGNOSIS — N2581 Secondary hyperparathyroidism of renal origin: Secondary | ICD-10-CM | POA: Diagnosis not present

## 2023-01-06 DIAGNOSIS — Z992 Dependence on renal dialysis: Secondary | ICD-10-CM | POA: Diagnosis not present

## 2023-01-06 NOTE — Progress Notes (Signed)
Office Visit Note   Patient: Kathryn Beck           Date of Birth: January 28, 1956           MRN: [id number] Visit Date: 12/31/2022              Requested by: [name], MD [address] [place],  [place] [address] PCP: [name], MD  Chief Complaint  Patient presents with   Left Leg - Routine Post Op    10/07/22 left BKA      HPI: Patient is a 67 year old woman who presents 3 months status post left below-knee amputation.  Patient has been provided a prescription for shrinkers and bilateral prosthesis.  Assessment & Plan: Visit Diagnoses:  1. Right below-knee amputee (HCC)   2. Left below-knee amputee [facility])     Plan: Patient will continue with wound care and the stump shrinker's.  Reevaluate in 4 weeks.  Follow-Up Instructions: Return in about 4 weeks (around 01/28/2023).   Ortho Exam  Patient is alert, oriented, no adenopathy, well-dressed, normal affect, normal respiratory effort. Examination the right below-knee amputation is well-healed no swelling no cellulitis.  Left below-knee amputation has a wound that is 1 cm in diameter and superficial with 100% healthy granulation tissue.  Imaging: No results found. No images are attached to the encounter.  Labs: Lab Results  Component Value Date   HGBA1C 7.7 (A) 12/15/2022   HGBA1C 6.3 (H) 07/07/2022   HGBA1C 9.3 (A) 04/16/2022   ESRSEDRATE 70 (H) 09/03/2022   ESRSEDRATE 79 (H) 08/13/2022   ESRSEDRATE 37 (H) 04/25/2022   CRP 0.9 09/03/2022   CRP 9.1 (H) 08/13/2022   CRP 29.4 (H) 04/25/2022   LABURIC 1.8 (L) 06/07/2018   LABURIC 6.2 04/22/2018   LABURIC 9.3 (H) 10/07/2015   REPTSTATUS 09/09/2022 FINAL 09/04/2022   GRAMSTAIN  09/04/2022    RARE WBC PRESENT, PREDOMINANTLY PMN NO ORGANISMS SEEN    CULT  09/04/2022    RARE ENTEROBACTER HORMAECHEI NO ANAEROBES ISOLATED Performed at [facility], [address] [place], [place] [address]    LABORGA ENTEROBACTER HORMAECHEI  09/04/2022     Lab Results  Component Value Date   ALBUMIN 3.1 (L) 12/28/2022   ALBUMIN 3.2 (L) 12/17/2022   ALBUMIN 3.8 (L) 12/15/2022   PREALBUMIN 26 08/13/2022   PREALBUMIN 24 04/25/2022    Lab Results  Component Value Date   MG 2.2 12/28/2022   MG 2.1 11/19/2022   MG 1.9 09/08/2022   Lab Results  Component Value Date   VD25OH 30.9 09/28/2017   VD25OH 15 (L) 10/07/2015   VD25OH 16 (L) 04/25/2015    Lab Results  Component Value Date   PREALBUMIN 26 08/13/2022   PREALBUMIN 24 04/25/2022      Latest Ref Rng & Units 12/28/2022    8:42 AM 12/17/2022    3:43 PM 12/15/2022    1:40 PM  CBC EXTENDED  WBC 4.0 - 10.5 K/uL 11.6  7.5  7.2   RBC 3.87 - 5.11 MIL/uL 4.52  4.59  4.58   Hemoglobin 12.0 - 15.0 g/dL 47.4  25.9  56.3   HCT 36.0 - 46.0 % 36.2  36.5  37.0   Platelets 150 - 400 K/uL 262  309  354   NEUT# 1.7 - 7.7 K/uL 10.2  6.0  5.6   Lymph# 0.7 - 4.0 K/uL 0.8  0.8  1.0      There is no height  or weight on file to calculate BMI.  Orders:  No orders of the defined types were placed in this encounter.  No orders of the defined types were placed in this encounter.    Procedures: No procedures performed  Clinical Data: No additional findings.  ROS:  All other systems negative, except as noted in the HPI. Review of Systems  Objective: Vital Signs: There were no vitals taken for this visit.  Specialty Comments:  No specialty comments available.  PMFS History: Patient Active Problem List   Diagnosis Date Noted   Volume overload 12/28/2022   Fall at home 12/18/2022   CVA (cerebral vascular accident) (HCC) 12/17/2022   Acute confusion 12/10/2022   Poor appetite 12/10/2022   Chills 12/10/2022   Generalized weakness 11/18/2022   Paronychia of finger of right hand 11/13/2022   Dehiscence of amputation stump of left lower extremity (HCC) 10/07/2022   Status post below-knee amputation of left lower extremity (HCC) 10/07/2022   Subacute osteomyelitis,  left ankle and foot (HCC) 09/03/2022   Left foot infection 09/03/2022   Amputated left leg (HCC) 09/03/2022   Wound infection after surgery 09/03/2022   Equinus contracture of left ankle 08/15/2022   Acute osteomyelitis of toe of left foot (HCC) 08/13/2022   Peripheral vascular complication 07/21/2022   PVD (peripheral vascular disease) (HCC) 07/18/2022   Toe ulcer, left, limited to breakdown of skin (HCC) 07/13/2022   S/P BKA (below knee amputation), right (HCC) 05/29/2022   Confusion 05/19/2022   Visual hallucinations 05/19/2022   Watery eyes 05/19/2022   Requires assistance with activities of daily living (ADL) 04/26/2022   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   Gangrene of right foot (HCC) 04/25/2022   PAD (peripheral artery disease) (HCC) 04/25/2022   Other osteoporosis without current pathological fracture 11/10/2021   Hypocalcemia 11/10/2021   Fall 11/02/2021   Left foot pain 10/31/2021   Low back pain with left-sided sciatica 10/31/2021   Left ear impacted cerumen 09/18/2021   Chronic left shoulder pain 06/10/2021   Abnormal CXR 03/10/2021   Ear pain, bilateral 03/10/2021   Morbid obesity (HCC) 03/10/2021   Subungual hematoma of second toe of left foot 10/28/2020   Insomnia 10/28/2020   Memory loss or impairment 09/26/2020   Forgetfulness 09/26/2020   Recurrent vertigo 09/26/2020   Abnormal MRI, cervical spine 03/04/2020   Right leg weakness 02/28/2020   Knee pain, right 10/23/2019   Cough 08/09/2019   Carpal tunnel syndrome 06/13/2019   Chronic pain syndrome 06/13/2019   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) 06/13/2019   Neck pain 03/23/2019   Near syncope 12/05/2018   ESRD on hemodialysis (HCC) 11/22/2018   Ischemic heart disease 09/25/2018   Not currently working due to disabled status 06/13/2018   HTN (hypertension) 06/12/2018   Depression 03/09/2018   Adrenal mass, left (HCC) 11/09/2017   MGUS (monoclonal gammopathy of  unknown significance) 11/05/2017   Shoulder pain 11/01/2017   Chronic diastolic (congestive) heart failure (HCC) 09/20/2017   Bilateral leg weakness 09/09/2017   Adrenal adenoma 09/03/2017   Uncontrolled type 2 diabetes mellitus 08/18/2017   Unsteady gait 07/11/2017   Recurrent falls 07/11/2017   Anemia of chronic disease 03/24/2017   History of colonic polyps 02/10/2017   Dysphagia 11/05/2016   Irritable bowel syndrome 02/04/2016   Hospital discharge follow-up 10/27/2015   Intolerant of heat 10/07/2014   Cardiac murmur 01/17/2014   Back pain with left-sided radiculopathy 09/18/2013   Seasonal allergies 03/10/2013  Lipoma of back 12/22/2012   Other seasonal allergic rhinitis 08/22/2012   OSA (obstructive sleep apnea) 06/02/2011   Chronic kidney disease, stage 4 (severe) (HCC) 01/13/2011   Neuropathy 04/16/2010   Malaise and fatigue 07/24/2009   Personal history of other diseases of the nervous system and sense organs 05/08/2009   LUPUS ERYTHEMATOSUS, DISCOID 06/05/2008   Arm pain 05/30/2008   Hyperlipidemia 06/07/2007   Obesity 06/07/2007   Malignant hypertension 06/07/2007   Type 2 diabetes mellitus with hyperglycemia (HCC) 06/07/2007   Diabetes mellitus (HCC) 06/07/2007   Past Medical History:  Diagnosis Date   Acid reflux    Amputated left leg (HCC)    Anemia    Arthritis    Axillary masses    Soft tissue - status post excision   Back pain    Cancer (HCC)    CHF (congestive heart failure) (HCC)    COVID-19 virus infection 04/06/2019   Depression    End-stage renal disease (HCC)    M/W/F dialysis   Essential hypertension    Headache    years ago   History of blood transfusion    History of cardiac catheterization    Normal coronary arteries October 2020   History of claustrophobia    History of pneumonia 2019   Hypoxia 04/03/2019   Memory loss    Mixed hyperlipidemia    Obesity    Pancreatitis    Peritoneal dialysis catheter in place [facility])    Pneumonia  due to COVID-19 virus 04/02/2019   Sleep apnea    Noncompliant with CPAP   Stroke (HCC)    mini stroke   Type 2 diabetes mellitus (HCC)     Family History  Problem Relation Age of Onset   Hypertension Father    Hypercholesterolemia Father    Arthritis Father    Hypertension Sister    Hypercholesterolemia Sister    Breast cancer Sister    Hypertension Sister    Colon cancer Neg Hx    Colon polyps Neg Hx     Past Surgical History:  Procedure Laterality Date   ABDOMINAL AORTOGRAM W/LOWER EXTREMITY N/A 04/30/2022   Procedure: ABDOMINAL AORTOGRAM W/LOWER EXTREMITY;  Surgeon: Leonie Douglas, MD;  Location: [facility] INVASIVE CV LAB;  Service: Cardiovascular;  Laterality: N/A;   ABDOMINAL AORTOGRAM W/LOWER EXTREMITY N/A 07/21/2022   Procedure: ABDOMINAL AORTOGRAM W/LOWER EXTREMITY;  Surgeon: Nada Libman, MD;  Location: [facility] INVASIVE CV LAB;  Service: Cardiovascular;  Laterality: N/A;   ABDOMINAL HYSTERECTOMY     ACHILLES TENDON LENGTHENING  08/15/2022   Procedure: ACHILLES TENDON LENGTHENING;  Surgeon: [name], DPM;  Location: [facility] OR;  Service: Podiatry;;   AMPUTATION Right 05/29/2022   Procedure: RIGHT BELOW THE KNEE AMPUTATION;  Surgeon: [name], MD;  Location: [facility] OR;  Service: Orthopedics;  Laterality: Right;   AMPUTATION Left 09/04/2022   Procedure: AMPUTATION FOOT, serial irrigation;  Surgeon: Louann Sjogren, DPM;  Location: [facility] OR;  Service: Podiatry;  Laterality: Left;  Surgical team to do block   AMPUTATION Left 10/07/2022   Procedure: LEFT BELOW KNEE AMPUTATION;  Surgeon: [name], MD;  Location: [facility] OR;  Service: Orthopedics;  Laterality: Left;   AV FISTULA PLACEMENT Left 09/02/2017   Procedure: creation of left arm ARTERIOVENOUS (AV) FISTULA;  Surgeon: Nada Libman, MD;  Location: [facility] OR;  Service: Vascular;  Laterality: Left;   COLONOSCOPY  2008   Dr. Darrick Penna: normal    COLONOSCOPY N/A 12/18/2016   Dr. Darrick Penna: multiple tubular  adenomas, internal hemorrhoids.  Surveillance in 3 years    ESOPHAGEAL DILATION N/A 10/13/2015   Procedure: ESOPHAGEAL DILATION;  Surgeon: Malissa Hippo, MD;  Location: [facility];  Service: Endoscopy;  Laterality: N/A;   ESOPHAGOGASTRODUODENOSCOPY N/A 10/13/2015   Dr. Karilyn Cota: chronic gastritis on path, no H.pylori. Empiric dilation    ESOPHAGOGASTRODUODENOSCOPY N/A 12/18/2016   Dr. Darrick Penna: mild gastritis. BRAVO study revealed uncontrolled GERD. Dysphagia secondary to uncontrolled reflux   FOOT SURGERY Bilateral    "nerve"     LEFT HEART CATH AND CORONARY ANGIOGRAPHY N/A 12/29/2018   Procedure: LEFT HEART CATH AND CORONARY ANGIOGRAPHY;  Surgeon: Corky Crafts, MD;  Location: [facility] INVASIVE CV LAB;  Service: Cardiovascular;  Laterality: N/A;   LOWER EXTREMITY ANGIOGRAPHY Right 05/04/2022   Procedure: Lower Extremity Angiography;  Surgeon: Victorino Sparrow, MD;  Location: [facility] INVASIVE CV LAB;  Service: Cardiovascular;  Laterality: Right;   LUNG BIOPSY     MASS EXCISION Right 01/09/2013   Procedure: EXCISION OF NEOPLASM OF RIGHT  AXILLA  AND EXCISION OF NEOPLASM OF LEFT AXILLA;  Surgeon: Dalia Heading, MD;  Location: [facility] ORS;  Service: General;  Laterality: Right;  procedure end @ 08:23   MYRINGOTOMY WITH TUBE PLACEMENT Bilateral 04/28/2017   Procedure: BILATERAL MYRINGOTOMY WITH TUBE PLACEMENT;  Surgeon: Newman Pies, MD;  Location: [facility] OR;  Service: ENT;  Laterality: Bilateral;   PERIPHERAL VASCULAR BALLOON ANGIOPLASTY Right 05/04/2022   Procedure: PERIPHERAL VASCULAR BALLOON ANGIOPLASTY;  Surgeon: Victorino Sparrow, MD;  Location: [facility] INVASIVE CV LAB;  Service: Cardiovascular;  Laterality: Right;  PT   PERIPHERAL VASCULAR INTERVENTION Right 05/04/2022   Procedure: PERIPHERAL VASCULAR INTERVENTION;  Surgeon: Victorino Sparrow, MD;  Location: [facility] INVASIVE CV LAB;  Service: Cardiovascular;  Laterality: Right;  SFA   PERIPHERAL VASCULAR INTERVENTION Left 07/21/2022   Procedure: PERIPHERAL VASCULAR INTERVENTION;  Surgeon: Nada Libman,  MD;  Location: [facility];  Service: Cardiovascular;  Laterality: Left;   REVISION OF ARTERIOVENOUS GORETEX GRAFT Left 05/04/2018   Procedure: TRANSPOSITION OF CEPHALIC VEIN ARTERIOVENOUS FISTULA LEFT ARM;  Surgeon: Larina Earthly, MD;  Location: [facility] OR;  Service: Vascular;  Laterality: Left;   SAVORY DILATION N/A 12/18/2016   Procedure: SAVORY DILATION;  Surgeon: West Bali, MD;  Location: [facility];  Service: Endoscopy;  Laterality: N/A;   TRANSMETATARSAL AMPUTATION Left 08/15/2022   Procedure: TRANSMETATARSAL AMPUTATION;  Surgeon: [name], DPM;  Location: [facility] OR;  Service: Podiatry;  Laterality: Left;   Social History   Occupational History   Occupation: retired   Tobacco Use   Smoking status: Never    Passive exposure: Never   Smokeless tobacco: Never   Tobacco comments:    Verified by Daughter, [name]  Vaping Use   Vaping status: Never Used  Substance and Sexual Activity   Alcohol use: No   Drug use: No   Sexual activity: Not Currently    Partners: Male

## 2023-01-07 DIAGNOSIS — M62522 Muscle wasting and atrophy, not elsewhere classified, left upper arm: Secondary | ICD-10-CM | POA: Diagnosis not present

## 2023-01-07 DIAGNOSIS — Z4781 Encounter for orthopedic aftercare following surgical amputation: Secondary | ICD-10-CM | POA: Diagnosis not present

## 2023-01-07 DIAGNOSIS — I5032 Chronic diastolic (congestive) heart failure: Secondary | ICD-10-CM | POA: Diagnosis not present

## 2023-01-07 DIAGNOSIS — D631 Anemia in chronic kidney disease: Secondary | ICD-10-CM | POA: Diagnosis not present

## 2023-01-07 DIAGNOSIS — E1151 Type 2 diabetes mellitus with diabetic peripheral angiopathy without gangrene: Secondary | ICD-10-CM | POA: Diagnosis not present

## 2023-01-07 DIAGNOSIS — I132 Hypertensive heart and chronic kidney disease with heart failure and with stage 5 chronic kidney disease, or end stage renal disease: Secondary | ICD-10-CM | POA: Diagnosis not present

## 2023-01-07 DIAGNOSIS — M62521 Muscle wasting and atrophy, not elsewhere classified, right upper arm: Secondary | ICD-10-CM | POA: Diagnosis not present

## 2023-01-07 DIAGNOSIS — N186 End stage renal disease: Secondary | ICD-10-CM | POA: Diagnosis not present

## 2023-01-07 DIAGNOSIS — E1122 Type 2 diabetes mellitus with diabetic chronic kidney disease: Secondary | ICD-10-CM | POA: Diagnosis not present

## 2023-01-08 ENCOUNTER — Other Ambulatory Visit: Payer: Self-pay

## 2023-01-08 ENCOUNTER — Telehealth: Payer: Self-pay | Admitting: Family Medicine

## 2023-01-08 DIAGNOSIS — N186 End stage renal disease: Secondary | ICD-10-CM | POA: Diagnosis not present

## 2023-01-08 DIAGNOSIS — Z992 Dependence on renal dialysis: Secondary | ICD-10-CM | POA: Diagnosis not present

## 2023-01-08 DIAGNOSIS — N2581 Secondary hyperparathyroidism of renal origin: Secondary | ICD-10-CM | POA: Diagnosis not present

## 2023-01-08 MED ORDER — EZETIMIBE 10 MG PO TABS: 10.0000 mg | ORAL_TABLET | Freq: Every day | ORAL | 11 refills | Status: DC

## 2023-01-08 NOTE — Telephone Encounter (Signed)
Sarah Nurse with [name] [phone]  Verbal orders Re certification 1 x week 4 weeks

## 2023-01-11 DIAGNOSIS — N2581 Secondary hyperparathyroidism of renal origin: Secondary | ICD-10-CM | POA: Diagnosis not present

## 2023-01-11 DIAGNOSIS — N186 End stage renal disease: Secondary | ICD-10-CM | POA: Diagnosis not present

## 2023-01-11 DIAGNOSIS — Z992 Dependence on renal dialysis: Secondary | ICD-10-CM | POA: Diagnosis not present

## 2023-01-12 ENCOUNTER — Other Ambulatory Visit: Payer: Self-pay

## 2023-01-12 DIAGNOSIS — T8781 Dehiscence of amputation stump: Secondary | ICD-10-CM | POA: Diagnosis not present

## 2023-01-12 DIAGNOSIS — I110 Hypertensive heart disease with heart failure: Secondary | ICD-10-CM | POA: Diagnosis not present

## 2023-01-12 DIAGNOSIS — Z89512 Acquired absence of left leg below knee: Secondary | ICD-10-CM | POA: Diagnosis not present

## 2023-01-12 DIAGNOSIS — N186 End stage renal disease: Secondary | ICD-10-CM | POA: Diagnosis not present

## 2023-01-12 DIAGNOSIS — M62522 Muscle wasting and atrophy, not elsewhere classified, left upper arm: Secondary | ICD-10-CM | POA: Diagnosis not present

## 2023-01-12 DIAGNOSIS — E1151 Type 2 diabetes mellitus with diabetic peripheral angiopathy without gangrene: Secondary | ICD-10-CM | POA: Diagnosis not present

## 2023-01-12 DIAGNOSIS — E1122 Type 2 diabetes mellitus with diabetic chronic kidney disease: Secondary | ICD-10-CM | POA: Diagnosis not present

## 2023-01-12 DIAGNOSIS — I5032 Chronic diastolic (congestive) heart failure: Secondary | ICD-10-CM | POA: Diagnosis not present

## 2023-01-12 DIAGNOSIS — D631 Anemia in chronic kidney disease: Secondary | ICD-10-CM | POA: Diagnosis not present

## 2023-01-12 MED ORDER — EZETIMIBE 10 MG PO TABS: 10.0000 mg | ORAL_TABLET | Freq: Every day | ORAL | 11 refills | Status: DC

## 2023-01-13 DIAGNOSIS — N186 End stage renal disease: Secondary | ICD-10-CM | POA: Diagnosis not present

## 2023-01-13 DIAGNOSIS — Z992 Dependence on renal dialysis: Secondary | ICD-10-CM | POA: Diagnosis not present

## 2023-01-13 DIAGNOSIS — N2581 Secondary hyperparathyroidism of renal origin: Secondary | ICD-10-CM | POA: Diagnosis not present

## 2023-01-14 ENCOUNTER — Ambulatory Visit: Payer: Medicare PPO | Admitting: Family Medicine

## 2023-01-14 DIAGNOSIS — N186 End stage renal disease: Secondary | ICD-10-CM | POA: Diagnosis not present

## 2023-01-14 DIAGNOSIS — I5032 Chronic diastolic (congestive) heart failure: Secondary | ICD-10-CM | POA: Diagnosis not present

## 2023-01-14 DIAGNOSIS — T8781 Dehiscence of amputation stump: Secondary | ICD-10-CM | POA: Diagnosis not present

## 2023-01-14 DIAGNOSIS — M62522 Muscle wasting and atrophy, not elsewhere classified, left upper arm: Secondary | ICD-10-CM | POA: Diagnosis not present

## 2023-01-14 DIAGNOSIS — Z89512 Acquired absence of left leg below knee: Secondary | ICD-10-CM | POA: Diagnosis not present

## 2023-01-14 DIAGNOSIS — I110 Hypertensive heart disease with heart failure: Secondary | ICD-10-CM | POA: Diagnosis not present

## 2023-01-14 DIAGNOSIS — E1122 Type 2 diabetes mellitus with diabetic chronic kidney disease: Secondary | ICD-10-CM | POA: Diagnosis not present

## 2023-01-14 DIAGNOSIS — E1151 Type 2 diabetes mellitus with diabetic peripheral angiopathy without gangrene: Secondary | ICD-10-CM | POA: Diagnosis not present

## 2023-01-14 DIAGNOSIS — D631 Anemia in chronic kidney disease: Secondary | ICD-10-CM | POA: Diagnosis not present

## 2023-01-15 DIAGNOSIS — N186 End stage renal disease: Secondary | ICD-10-CM | POA: Diagnosis not present

## 2023-01-15 DIAGNOSIS — Z992 Dependence on renal dialysis: Secondary | ICD-10-CM | POA: Diagnosis not present

## 2023-01-15 DIAGNOSIS — N2581 Secondary hyperparathyroidism of renal origin: Secondary | ICD-10-CM | POA: Diagnosis not present

## 2023-01-18 ENCOUNTER — Ambulatory Visit: Payer: Self-pay | Admitting: *Deleted

## 2023-01-18 DIAGNOSIS — Z992 Dependence on renal dialysis: Secondary | ICD-10-CM | POA: Diagnosis not present

## 2023-01-18 DIAGNOSIS — I96 Gangrene, not elsewhere classified: Secondary | ICD-10-CM | POA: Diagnosis not present

## 2023-01-18 DIAGNOSIS — N186 End stage renal disease: Secondary | ICD-10-CM | POA: Diagnosis not present

## 2023-01-18 DIAGNOSIS — N2581 Secondary hyperparathyroidism of renal origin: Secondary | ICD-10-CM | POA: Diagnosis not present

## 2023-01-18 DIAGNOSIS — Z89511 Acquired absence of right leg below knee: Secondary | ICD-10-CM | POA: Diagnosis not present

## 2023-01-18 DIAGNOSIS — S88912A Complete traumatic amputation of left lower leg, level unspecified, initial encounter: Secondary | ICD-10-CM | POA: Diagnosis not present

## 2023-01-18 NOTE — Patient Outreach (Signed)
Care Coordination   Follow Up Visit Note   01/18/2023  Name: Kathryn Beck MRN: [id number] DOB: [birth year]  Kathryn Beck is a 67 y.o. year old female who sees Kathryn Perches, MD for primary care. I spoke with Kathryn Beck and daughter, Kathryn Beck by phone today.  What matters to the patients health and wellness today?  Receive Assistance Applying for Aid & Attendance Benefits, through [employer].    Goals Addressed             This Visit's Progress    Receive Assistance Applying for Aid & Attendance Benefits, through [employer].   On track    Care Coordination Interventions:  Interventions Today    Flowsheet Row Most Recent Value  Chronic Disease   Chronic disease during today's visit Diabetes, Hypertension (HTN), Chronic Kidney Disease/End Stage Renal Disease (ESRD), Congestive Heart Failure (CHF), Other  [Neuropathy, Chronic Pain Syndrome, Anxiety, Depression, Memory Loss, Neurocognitive Disorder & Requires Maximum Assistance with Activities of Daily Living]  General Interventions   General Interventions Discussed/Reviewed General Interventions Reviewed, Annual Eye Exam, General Interventions Discussed, Labs, Annual Foot Exam, Lipid Profile, Durable Medical Equipment (DME), Vaccines, Health Screening, [employer], Doctor Visits, Communication with, Level of Care  [Primary Care Provider]  Labs Hgb A1c every 3 months, Kidney Function  Vaccines COVID-19, Flu, Pneumonia, RSV, Shingles, Tetanus/Pertussis/Diphtheria  [Encouraged]  Doctor Visits Discussed/Reviewed Doctor Visits Discussed, Doctor Visits Reviewed, Annual Wellness Visits, PCP, Specialist  [Encouraged]  Health Screening Colonoscopy, Mammogram  [Encouraged]  Durable Medical Equipment (DME) BP Cuff, Glucomoter, Environmental consultant, Wheelchair, Physicist, medical  PCP/Specialist Visits Compliance with follow-up visit  Communication with PCP/Specialists, RN  Level of  Care Adult Daycare, Air traffic controller, Assisted Living, Skilled Nursing Facility, Teaching laboratory technician Medicaid, Personal Care Services  Exercise Interventions   Exercise Discussed/Reviewed Exercise Discussed, Exercise Reviewed, Physical Activity, Assistive device use and maintanence  Physical Activity Discussed/Reviewed Physical Activity Discussed, Physical Activity Reviewed, Types of exercise, Home Exercise Program (HEP)  [Encouraged]  Education Interventions   Education Provided Provided Therapist, sports, Provided Web-based Education, Provided Education  Provided Verbal Education On Nutrition, Foot Care, Eye Care, Labs, Blood Sugar Monitoring, Mental Health/Coping with Illness, Applications, Exercise, Medication, When to see the doctor, [employer], Human resources officer, Personal Care Services  Mental Health Interventions   Mental Health Discussed/Reviewed Mental Health Discussed, Mental Health Reviewed, Coping Strategies, Crisis, Anxiety, Depression, Grief and Loss, Substance Abuse, Suicide  Nutrition Interventions   Nutrition Discussed/Reviewed Nutrition Discussed, Nutrition Reviewed, Adding fruits and vegetables, Fluid intake, Decreasing sugar intake, Increaing proteins, Decreasing fats, Decreasing salt  Pharmacy Interventions   Pharmacy Dicussed/Reviewed Pharmacy Topics Discussed, Pharmacy Topics Reviewed, Medication Adherence, Affording Medications, Referral to Pharmacist  Medication Adherence Unable to refill medication  Referral to Pharmacist Cannot afford medications  Safety Interventions   Safety Discussed/Reviewed Safety Discussed, Safety Reviewed, Fall Risk, Home Safety  Home Safety Assistive Devices, Need for home safety assessment, Refer for home visit, Contact provider for referral to PT/OT, Refer for community resources, Contact home health agency  Advanced Directive Interventions   Advanced Directives Discussed/Reviewed Advanced Directives  Discussed, Advanced Directives Reviewed, Advanced Care Planning, Provided resource for acquiring and filling out documents      Active Listening & Reflection Utilized.  Verbalization of Feelings Encouraged. Emotional Support Provided. Solution-Focused Strategies Employed. Problem Solving Interventions Implemented. Cognitive Behavioral Therapy Initiated.  Acceptance & Commitment Therapy Performed. Continue to Receive 24 Hour Care & Supervision through Private Pay  Caregivers. CSW Collaboration with Daughter, Kathryn Beck to [employer] with CSW [phone]), if She Has Questions, Needs Assistance, or If Additional Social Work Needs Are Identified Between Now & Our Next Follow-Up Outreach Call, Scheduled on 02/08/2023 at 9:00 AM.      SDOH assessments and interventions completed:  Yes.  Care Coordination Interventions:  Yes, provided.   Follow up plan: Follow up call scheduled for 02/08/2023 at 9:00 am.  Encounter Outcome:  Patient Visit Completed.   Kathryn Beck, BSW, MSW, Printmaker Social Work Case Set designer Health  [facility], Population Health Direct Dial: [phone]  Fax: [phone] Email: [email or web address]: [email or web address]

## 2023-01-18 NOTE — Patient Instructions (Signed)
Visit Information  Thank you for taking time to visit with me today. Please don't hesitate to contact me if I can be of assistance to you.   Following are the goals we discussed today:   Goals Addressed             This Visit's Progress    Receive Assistance Applying for Aid & Attendance Benefits, through [employer].   On track    Care Coordination Interventions:  Interventions Today    Flowsheet Row Most Recent Value  Chronic Disease   Chronic disease during today's visit Diabetes, Hypertension (HTN), Chronic Kidney Disease/End Stage Renal Disease (ESRD), Congestive Heart Failure (CHF), Other  [Neuropathy, Chronic Pain Syndrome, Anxiety, Depression, Memory Loss, Neurocognitive Disorder & Requires Maximum Assistance with Activities of Daily Living]  General Interventions   General Interventions Discussed/Reviewed General Interventions Reviewed, Annual Eye Exam, General Interventions Discussed, Labs, Annual Foot Exam, Lipid Profile, Durable Medical Equipment (DME), Vaccines, Health Screening, [employer], Doctor Visits, Communication with, Level of Care  [Primary Care Provider]  Labs Hgb A1c every 3 months, Kidney Function  Vaccines COVID-19, Flu, Pneumonia, RSV, Shingles, Tetanus/Pertussis/Diphtheria  [Encouraged]  Doctor Visits Discussed/Reviewed Doctor Visits Discussed, Doctor Visits Reviewed, Annual Wellness Visits, PCP, Specialist  [Encouraged]  Health Screening Colonoscopy, Mammogram  [Encouraged]  Durable Medical Equipment (DME) BP Cuff, Glucomoter, Environmental consultant, Wheelchair, Physicist, medical  PCP/Specialist Visits Compliance with follow-up visit  Communication with PCP/Specialists, RN  Level of Care Adult Daycare, Air traffic controller, Assisted Living, Skilled Nursing Facility, Teaching laboratory technician Medicaid, Personal Care Services  Exercise Interventions   Exercise Discussed/Reviewed Exercise Discussed, Exercise Reviewed, Physical  Activity, Assistive device use and maintanence  Physical Activity Discussed/Reviewed Physical Activity Discussed, Physical Activity Reviewed, Types of exercise, Home Exercise Program (HEP)  [Encouraged]  Education Interventions   Education Provided Provided Therapist, sports, Provided Web-based Education, Provided Education  Provided Verbal Education On Nutrition, Foot Care, Eye Care, Labs, Blood Sugar Monitoring, Mental Health/Coping with Illness, Applications, Exercise, Medication, When to see the doctor, [employer], Human resources officer, Personal Care Services  Mental Health Interventions   Mental Health Discussed/Reviewed Mental Health Discussed, Mental Health Reviewed, Coping Strategies, Crisis, Anxiety, Depression, Grief and Loss, Substance Abuse, Suicide  Nutrition Interventions   Nutrition Discussed/Reviewed Nutrition Discussed, Nutrition Reviewed, Adding fruits and vegetables, Fluid intake, Decreasing sugar intake, Increaing proteins, Decreasing fats, Decreasing salt  Pharmacy Interventions   Pharmacy Dicussed/Reviewed Pharmacy Topics Discussed, Pharmacy Topics Reviewed, Medication Adherence, Affording Medications, Referral to Pharmacist  Medication Adherence Unable to refill medication  Referral to Pharmacist Cannot afford medications  Safety Interventions   Safety Discussed/Reviewed Safety Discussed, Safety Reviewed, Fall Risk, Home Safety  Home Safety Assistive Devices, Need for home safety assessment, Refer for home visit, Contact provider for referral to PT/OT, Refer for community resources, Contact home health agency  Advanced Directive Interventions   Advanced Directives Discussed/Reviewed Advanced Directives Discussed, Advanced Directives Reviewed, Advanced Care Planning, Provided resource for acquiring and filling out documents      Active Listening & Reflection Utilized.  Verbalization of Feelings Encouraged. Emotional Support  Provided. Solution-Focused Strategies Employed. Problem Solving Interventions Implemented. Cognitive Behavioral Therapy Initiated.  Acceptance & Commitment Therapy Performed. Continue to Receive 24 Hour Care & Supervision through Private Pay Caregivers. CSW Collaboration with Daughter, [name] to [employer] with CSW [phone]), if She Has Questions, Needs Assistance, or If Additional Social Work Needs Are Identified Between Now & Our Next Follow-Up Outreach Call, Scheduled on 02/08/2023 at  9:00 AM.      Our next appointment is by telephone on 02/08/2023 at 9:00 am.  Please call the care guide team at [phone] if you need to cancel or reschedule your appointment.   If you are experiencing a Mental Health or Behavioral Health Crisis or need someone to talk to, please call the Suicide and Crisis Lifeline: 988 call the [place] National Suicide Prevention Lifeline: (671) 460-4526 or TTY: 847-051-9470 TTY 541 622 8402) to talk to a trained counselor call 1-800-273-TALK (toll free, 24 hour hotline) go to [facility] [address], [place] [phone]) call the [facility] Crisis Line: [phone] call 911  Patient verbalizes understanding of instructions and care plan provided today and agrees to view in MyChart. Active MyChart status and patient understanding of how to access instructions and care plan via MyChart confirmed with patient.     Telephone follow up appointment with care management team member scheduled for:  02/08/2023 at 9:00 am.  Danford Bad, BSW, MSW, LCSW  Embedded Practice Social Work Case Manager  [facility], Population Health Direct Dial: [phone]  Fax: [phone] Email: [email or web address] Website: [email or web address]

## 2023-01-18 NOTE — Assessment & Plan Note (Signed)
Hyperlipidemia:Low fat diet discussed and encouraged.   Lipid Panel  Lab Results  Component Value Date   CHOL 123 12/18/2022   HDL 41 12/18/2022   LDLCALC 61 12/18/2022   LDLDIRECT 120.0 11/21/2020   TRIG 105 12/18/2022   CHOLHDL 3.0 12/18/2022     Controlled, no change in medication

## 2023-01-18 NOTE — Assessment & Plan Note (Signed)
Hospitalized due to fall and instability , d/c with h/h which had already been in place

## 2023-01-18 NOTE — Assessment & Plan Note (Signed)
Continue same

## 2023-01-18 NOTE — Progress Notes (Signed)
Kathryn Beck     MRN: [id number]      DOB: 01/31/1956  Chief Complaint  Patient presents with   Hospitalization Follow-up    TOC d/c 12/19/22     HPI Kathryn Beck is here for follow up of recent hospitalization from 9/6 to 12/19/2022 following a fall at home, dx is CVA which on imaging is chronic, uncontrolled HTN ROS Denies recent fever or chills. Denies sinus pressure, nasal congestion, ear pain or sore throat. Denies chest congestion, productive cough or wheezing. Denies chest pains, palpitations and leg swelling Denies abdominal pain, nausea, vomiting,diarrhea or constipation.   Denies dysuria, frequency, hesitancy or incontinence. 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 Chronic depression and anxiety or insomnia. C/o skin break breakdown oif skin of LLE  PE  BP (!) 172/80   Ht 5\' 3"  (1.6 m)   Wt 176 lb (79.8 kg)   BMI 31.18 kg/m   Patient alert and oriented  HEENT: No facial asymmetry, EOMI,     Neck decreased ROM  Chest: Clear to auscultation bilaterally.  CVS: S1, S2 no murmurs, no S3.Regular rate.  ABD: Soft non tender.   Ext: No edema  MS: decreased  ROM spine, shoulders, hips and knees.  Psych: Good eye contact, normal affect. Memory impaired , mildly  anxious and  depressed appearing.  CNS: CN 2-12 intact, power,  normal throughout.no focal deficits noted.   Assessment & Plan  Malignant hypertension Needs to be managed by [facility], longstanding uncontrolled   CVA (cerebral vascular accident) (HCC) Reported as chronic on imaging sturdy in 11/2022, improved bP control and blood sugar control needed   ESRD on hemodialysis (HCC) Continue same  Diabetes mellitus (HCC) Managed by Endo Uncontrolled,  Kathryn Beck is reminded of the importance of commitment to daily physical activity for 30 minutes or more, as able and the need to limit carbohydrate intake to  30 to 60 grams per meal to help with blood sugar control.   The need to take medication as prescribed, test blood sugar as directed, and to call between visits if there is a concern that blood sugar is uncontrolled is also discussed.   Kathryn Beck is reminded of the importance of daily foot exam, annual eye examination, and good blood sugar, blood pressure and cholesterol control.     Latest Ref Rng & Units 12/28/2022    8:42 AM 12/18/2022    4:13 AM 12/17/2022    3:43 PM 12/15/2022    1:30 PM 11/19/2022    3:20 AM  Diabetic Labs  HbA1c 4.0 - 5.6 %    7.7    Chol 0 - 200 mg/dL  829      HDL >56 mg/dL  41      Calc LDL 0 - 99 mg/dL  61      Triglycerides <150 mg/dL  213      Creatinine 0.86 - 1.00 mg/dL 5.78   4.69   6.29       12/31/2022    2:33 PM 12/31/2022    2:00 PM 12/31/2022    1:59 PM 12/28/2022    8:55 PM 12/28/2022    8:30 PM 12/28/2022    8:15 PM 12/28/2022    8:00 PM  BP/Weight  Systolic BP 172 162 167 158 147 168 155  Diastolic BP 80 72 102 74 72 86 80  Wt. (Lbs)   176 --     BMI  31.18 kg/m2          Latest Ref Rng & Units 04/16/2022   11:20 AM 07/24/2021    3:42 PM  Foot/eye exam completion dates  Eye Exam No Retinopathy  No Retinopathy      Foot Form Completion  Done      This result is from an external source.        Fall at home Hospitalized due to fall and instability , d/c with h/h which had already been in place   Encounter for support and coordination of transition of care Patient in for follow up of recent hospitalization. Discharge summary, and laboratory and radiology data are reviewed, and any questions or concerns  are discussed. Specific issues requiring follow up are specifically addressed.   Hyperlipidemia Hyperlipidemia:Low fat diet discussed and encouraged.   Lipid Panel  Lab Results  Component Value Date   CHOL 123 12/18/2022   HDL 41 12/18/2022   LDLCALC 61 12/18/2022   LDLDIRECT 120.0 11/21/2020   TRIG 105 12/18/2022    CHOLHDL 3.0 12/18/2022     Controlled, no change in medication

## 2023-01-18 NOTE — Assessment & Plan Note (Signed)
Needs to be managed by [facility], longstanding uncontrolled

## 2023-01-18 NOTE — Assessment & Plan Note (Signed)
Reported as chronic on imaging sturdy in 11/2022, improved bP control and blood sugar control needed

## 2023-01-18 NOTE — Assessment & Plan Note (Signed)
Patient in for follow up of recent hospitalization. Discharge summary, and laboratory and radiology data are reviewed, and any questions or concerns  are discussed. Specific issues requiring follow up are specifically addressed.  

## 2023-01-18 NOTE — Assessment & Plan Note (Signed)
Managed by End[facility] Uncontrolled,  Kathryn Beck is reminded of the importance of commitment to daily physical activity for 30 minutes or more, as able and the need to limit carbohydrate intake to 30 to 60 grams per meal to help with blood sugar control.   The need to take medication as prescribed, test blood sugar as directed, and to call between visits if there is a concern that blood sugar is uncontrolled is also discussed.   Kathryn Beck is reminded of the importance of daily foot exam, annual eye examination, and good blood sugar, blood pressure and cholesterol control.     Latest Ref Rng & Units 12/28/2022    8:42 AM 12/18/2022    4:13 AM 12/17/2022    3:43 PM 12/15/2022    1:30 PM 11/19/2022    3:20 AM  Diabetic Labs  HbA1c 4.0 - 5.6 %    7.7    Chol 0 - 200 mg/dL  629      HDL >52 mg/dL  41      Calc LDL 0 - 99 mg/dL  61      Triglycerides <150 mg/dL  841      Creatinine 3.24 - 1.00 mg/dL 4.01   0.27   2.53       12/31/2022    2:33 PM 12/31/2022    2:00 PM 12/31/2022    1:59 PM 12/28/2022    8:55 PM 12/28/2022    8:30 PM 12/28/2022    8:15 PM 12/28/2022    8:00 PM  BP/Weight  Systolic BP 172 162 167 158 147 168 155  Diastolic BP 80 72 102 74 72 86 80  Wt. (Lbs)   176 --     BMI   31.18 kg/m2          Latest Ref Rng & Units 04/16/2022   11:20 AM 07/24/2021    3:42 PM  Foot/eye exam completion dates  Eye Exam No Retinopathy  No Retinopathy      Foot Form Completion  Done      This result is from an external source.

## 2023-01-20 DIAGNOSIS — Z992 Dependence on renal dialysis: Secondary | ICD-10-CM | POA: Diagnosis not present

## 2023-01-20 DIAGNOSIS — N186 End stage renal disease: Secondary | ICD-10-CM | POA: Diagnosis not present

## 2023-01-20 DIAGNOSIS — N2581 Secondary hyperparathyroidism of renal origin: Secondary | ICD-10-CM | POA: Diagnosis not present

## 2023-01-21 DIAGNOSIS — I5032 Chronic diastolic (congestive) heart failure: Secondary | ICD-10-CM | POA: Diagnosis not present

## 2023-01-21 DIAGNOSIS — Z89512 Acquired absence of left leg below knee: Secondary | ICD-10-CM | POA: Diagnosis not present

## 2023-01-21 DIAGNOSIS — E1122 Type 2 diabetes mellitus with diabetic chronic kidney disease: Secondary | ICD-10-CM | POA: Diagnosis not present

## 2023-01-21 DIAGNOSIS — M62522 Muscle wasting and atrophy, not elsewhere classified, left upper arm: Secondary | ICD-10-CM | POA: Diagnosis not present

## 2023-01-21 DIAGNOSIS — Z992 Dependence on renal dialysis: Secondary | ICD-10-CM | POA: Diagnosis not present

## 2023-01-21 DIAGNOSIS — N186 End stage renal disease: Secondary | ICD-10-CM | POA: Diagnosis not present

## 2023-01-21 DIAGNOSIS — E1151 Type 2 diabetes mellitus with diabetic peripheral angiopathy without gangrene: Secondary | ICD-10-CM | POA: Diagnosis not present

## 2023-01-21 DIAGNOSIS — I110 Hypertensive heart disease with heart failure: Secondary | ICD-10-CM | POA: Diagnosis not present

## 2023-01-21 DIAGNOSIS — T8781 Dehiscence of amputation stump: Secondary | ICD-10-CM | POA: Diagnosis not present

## 2023-01-21 DIAGNOSIS — D631 Anemia in chronic kidney disease: Secondary | ICD-10-CM | POA: Diagnosis not present

## 2023-01-22 DIAGNOSIS — N2581 Secondary hyperparathyroidism of renal origin: Secondary | ICD-10-CM | POA: Diagnosis not present

## 2023-01-22 DIAGNOSIS — N186 End stage renal disease: Secondary | ICD-10-CM | POA: Diagnosis not present

## 2023-01-22 DIAGNOSIS — Z992 Dependence on renal dialysis: Secondary | ICD-10-CM | POA: Diagnosis not present

## 2023-01-25 DIAGNOSIS — N2581 Secondary hyperparathyroidism of renal origin: Secondary | ICD-10-CM | POA: Diagnosis not present

## 2023-01-25 DIAGNOSIS — Z992 Dependence on renal dialysis: Secondary | ICD-10-CM | POA: Diagnosis not present

## 2023-01-25 DIAGNOSIS — N186 End stage renal disease: Secondary | ICD-10-CM | POA: Diagnosis not present

## 2023-01-26 ENCOUNTER — Telehealth: Payer: Self-pay | Admitting: Family Medicine

## 2023-01-26 DIAGNOSIS — I110 Hypertensive heart disease with heart failure: Secondary | ICD-10-CM | POA: Diagnosis not present

## 2023-01-26 DIAGNOSIS — M62522 Muscle wasting and atrophy, not elsewhere classified, left upper arm: Secondary | ICD-10-CM | POA: Diagnosis not present

## 2023-01-26 DIAGNOSIS — T8781 Dehiscence of amputation stump: Secondary | ICD-10-CM | POA: Diagnosis not present

## 2023-01-26 DIAGNOSIS — N186 End stage renal disease: Secondary | ICD-10-CM | POA: Diagnosis not present

## 2023-01-26 DIAGNOSIS — Z89512 Acquired absence of left leg below knee: Secondary | ICD-10-CM | POA: Diagnosis not present

## 2023-01-26 DIAGNOSIS — E1122 Type 2 diabetes mellitus with diabetic chronic kidney disease: Secondary | ICD-10-CM | POA: Diagnosis not present

## 2023-01-26 DIAGNOSIS — D631 Anemia in chronic kidney disease: Secondary | ICD-10-CM | POA: Diagnosis not present

## 2023-01-26 DIAGNOSIS — E1151 Type 2 diabetes mellitus with diabetic peripheral angiopathy without gangrene: Secondary | ICD-10-CM | POA: Diagnosis not present

## 2023-01-26 DIAGNOSIS — I5032 Chronic diastolic (congestive) heart failure: Secondary | ICD-10-CM | POA: Diagnosis not present

## 2023-01-26 NOTE — Telephone Encounter (Signed)
FYI to let proivder know, Denzil Magnuson called from [facility] [phone],  Place physical therapy on hold for now still has wound on left lower extremity left leg to close no gait training now due to that spot. Nurse will contact him to come back for training.

## 2023-01-27 DIAGNOSIS — N2581 Secondary hyperparathyroidism of renal origin: Secondary | ICD-10-CM | POA: Diagnosis not present

## 2023-01-27 DIAGNOSIS — N186 End stage renal disease: Secondary | ICD-10-CM | POA: Diagnosis not present

## 2023-01-27 DIAGNOSIS — Z992 Dependence on renal dialysis: Secondary | ICD-10-CM | POA: Diagnosis not present

## 2023-01-28 ENCOUNTER — Ambulatory Visit (INDEPENDENT_AMBULATORY_CARE_PROVIDER_SITE_OTHER): Payer: Medicare PPO | Admitting: Orthopedic Surgery

## 2023-01-28 DIAGNOSIS — E1122 Type 2 diabetes mellitus with diabetic chronic kidney disease: Secondary | ICD-10-CM | POA: Diagnosis not present

## 2023-01-28 DIAGNOSIS — T8781 Dehiscence of amputation stump: Secondary | ICD-10-CM | POA: Diagnosis not present

## 2023-01-28 DIAGNOSIS — Z89512 Acquired absence of left leg below knee: Secondary | ICD-10-CM | POA: Diagnosis not present

## 2023-01-28 DIAGNOSIS — Z89511 Acquired absence of right leg below knee: Secondary | ICD-10-CM | POA: Diagnosis not present

## 2023-01-28 DIAGNOSIS — I110 Hypertensive heart disease with heart failure: Secondary | ICD-10-CM | POA: Diagnosis not present

## 2023-01-28 DIAGNOSIS — I5032 Chronic diastolic (congestive) heart failure: Secondary | ICD-10-CM | POA: Diagnosis not present

## 2023-01-28 DIAGNOSIS — E1151 Type 2 diabetes mellitus with diabetic peripheral angiopathy without gangrene: Secondary | ICD-10-CM | POA: Diagnosis not present

## 2023-01-28 DIAGNOSIS — M62522 Muscle wasting and atrophy, not elsewhere classified, left upper arm: Secondary | ICD-10-CM | POA: Diagnosis not present

## 2023-01-28 DIAGNOSIS — D631 Anemia in chronic kidney disease: Secondary | ICD-10-CM | POA: Diagnosis not present

## 2023-01-28 DIAGNOSIS — N186 End stage renal disease: Secondary | ICD-10-CM | POA: Diagnosis not present

## 2023-01-29 DIAGNOSIS — Z4781 Encounter for orthopedic aftercare following surgical amputation: Secondary | ICD-10-CM | POA: Diagnosis not present

## 2023-01-29 DIAGNOSIS — I5032 Chronic diastolic (congestive) heart failure: Secondary | ICD-10-CM | POA: Diagnosis not present

## 2023-01-29 DIAGNOSIS — E1140 Type 2 diabetes mellitus with diabetic neuropathy, unspecified: Secondary | ICD-10-CM | POA: Diagnosis not present

## 2023-01-29 DIAGNOSIS — R2689 Other abnormalities of gait and mobility: Secondary | ICD-10-CM | POA: Diagnosis not present

## 2023-01-29 DIAGNOSIS — M6281 Muscle weakness (generalized): Secondary | ICD-10-CM | POA: Diagnosis not present

## 2023-01-29 DIAGNOSIS — Z89511 Acquired absence of right leg below knee: Secondary | ICD-10-CM | POA: Diagnosis not present

## 2023-01-29 DIAGNOSIS — N186 End stage renal disease: Secondary | ICD-10-CM | POA: Diagnosis not present

## 2023-01-29 DIAGNOSIS — N2581 Secondary hyperparathyroidism of renal origin: Secondary | ICD-10-CM | POA: Diagnosis not present

## 2023-01-29 DIAGNOSIS — R296 Repeated falls: Secondary | ICD-10-CM | POA: Diagnosis not present

## 2023-01-29 DIAGNOSIS — Z992 Dependence on renal dialysis: Secondary | ICD-10-CM | POA: Diagnosis not present

## 2023-02-01 ENCOUNTER — Encounter: Payer: Self-pay | Admitting: Orthopedic Surgery

## 2023-02-01 DIAGNOSIS — Z992 Dependence on renal dialysis: Secondary | ICD-10-CM | POA: Diagnosis not present

## 2023-02-01 DIAGNOSIS — N2581 Secondary hyperparathyroidism of renal origin: Secondary | ICD-10-CM | POA: Diagnosis not present

## 2023-02-01 DIAGNOSIS — N186 End stage renal disease: Secondary | ICD-10-CM | POA: Diagnosis not present

## 2023-02-01 NOTE — Progress Notes (Signed)
Office Visit Note   Patient: Kathryn Beck           Date of Birth: 1955-06-28           MRN: [id number] Visit Date: 01/28/2023              Requested by: [name], MD [address], [address] [place],  [place] [address] PCP: [name], MD  Chief Complaint  Patient presents with   Right Leg - Follow-up    BKA   Left Leg - Follow-up    BKA      HPI: Patient is a 67 year old woman who is 4 months status post left below-knee amputation she is currently wearing a stump shrinker.  She states that her leg feels better.  Assessment & Plan: Visit Diagnoses:  1. Right below-knee amputee (HCC)   2. Left below-knee amputee [facility])     Plan: Patient was provided his prescription for [employer] for a 2 XL stump shrinker she is currently in a 4 XL stump shrinker.  She has a well-healed right below-knee amputation.  Follow-Up Instructions: Return in about 2 weeks (around 02/11/2023).   Ortho Exam  Patient is alert, oriented, no adenopathy, well-dressed, normal affect, normal respiratory effort. Examination the leg is healing well she has 1 small ulcer 1 cm in diameter with healthy granulation tissue.  Imaging: No results found. No images are attached to the encounter.  Labs: Lab Results  Component Value Date   HGBA1C 7.7 (A) 12/15/2022   HGBA1C 6.3 (H) 07/07/2022   HGBA1C 9.3 (A) 04/16/2022   ESRSEDRATE 70 (H) 09/03/2022   ESRSEDRATE 79 (H) 08/13/2022   ESRSEDRATE 37 (H) 04/25/2022   CRP 0.9 09/03/2022   CRP 9.1 (H) 08/13/2022   CRP 29.4 (H) 04/25/2022   LABURIC 1.8 (L) 06/07/2018   LABURIC 6.2 04/22/2018   LABURIC 9.3 (H) 10/07/2015   REPTSTATUS 09/09/2022 FINAL 09/04/2022   GRAMSTAIN  09/04/2022    RARE WBC PRESENT, PREDOMINANTLY PMN NO ORGANISMS SEEN    CULT  09/04/2022    RARE ENTEROBACTER HORMAECHEI NO ANAEROBES ISOLATED Performed at [facility], [address] [place], [place] [address]    LABORGA ENTEROBACTER HORMAECHEI 09/04/2022      Lab Results  Component Value Date   ALBUMIN 3.1 (L) 12/28/2022   ALBUMIN 3.2 (L) 12/17/2022   ALBUMIN 3.8 (L) 12/15/2022   PREALBUMIN 26 08/13/2022   PREALBUMIN 24 04/25/2022    Lab Results  Component Value Date   MG 2.2 12/28/2022   MG 2.1 11/19/2022   MG 1.9 09/08/2022   Lab Results  Component Value Date   VD25OH 30.9 09/28/2017   VD25OH 15 (L) 10/07/2015   VD25OH 16 (L) 04/25/2015    Lab Results  Component Value Date   PREALBUMIN 26 08/13/2022   PREALBUMIN 24 04/25/2022      Latest Ref Rng & Units 12/28/2022    8:42 AM 12/17/2022    3:43 PM 12/15/2022    1:40 PM  CBC EXTENDED  WBC 4.0 - 10.5 K/uL 11.6  7.5  7.2   RBC 3.87 - 5.11 MIL/uL 4.52  4.59  4.58   Hemoglobin 12.0 - 15.0 g/dL 02.7  25.3  66.4   HCT 36.0 - 46.0 % 36.2  36.5  37.0   Platelets 150 - 400 K/uL 262  309  354   NEUT# 1.7 - 7.7 K/uL 10.2  6.0  5.6   Lymph# 0.7 - 4.0 K/uL 0.8  0.8  1.0      There is no height or weight on file to calculate BMI.  Orders:  No orders of the defined types were placed in this encounter.  No orders of the defined types were placed in this encounter.    Procedures: No procedures performed  Clinical Data: No additional findings.  ROS:  All other systems negative, except as noted in the HPI. Review of Systems  Objective: Vital Signs: There were no vitals taken for this visit.  Specialty Comments:  No specialty comments available.  PMFS History: Patient Active Problem List   Diagnosis Date Noted   Volume overload 12/28/2022   Fall at home 12/18/2022   CVA (cerebral vascular accident) (HCC) 12/17/2022   Acute confusion 12/10/2022   Poor appetite 12/10/2022   Chills 12/10/2022   Generalized weakness 11/18/2022   Paronychia of finger of right hand 11/13/2022   Dehiscence of amputation stump of left lower extremity (HCC) 10/07/2022   Status post below-knee amputation of left lower extremity (HCC) 10/07/2022   Subacute osteomyelitis, left ankle  and foot (HCC) 09/03/2022   Left foot infection 09/03/2022   Amputated left leg (HCC) 09/03/2022   Wound infection after surgery 09/03/2022   Equinus contracture of left ankle 08/15/2022   Acute osteomyelitis of toe of left foot (HCC) 08/13/2022   Peripheral vascular complication 07/21/2022   PVD (peripheral vascular disease) (HCC) 07/18/2022   Toe ulcer, left, limited to breakdown of skin (HCC) 07/13/2022   S/P BKA (below knee amputation), right (HCC) 05/29/2022   Confusion 05/19/2022   Visual hallucinations 05/19/2022   Watery eyes 05/19/2022   Requires assistance with activities of daily living (ADL) 04/26/2022   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   Gangrene of right foot (HCC) 04/25/2022   PAD (peripheral artery disease) (HCC) 04/25/2022   Other osteoporosis without current pathological fracture 11/10/2021   Hypocalcemia 11/10/2021   Fall 11/02/2021   Left foot pain 10/31/2021   Low back pain with left-sided sciatica 10/31/2021   Left ear impacted cerumen 09/18/2021   Chronic left shoulder pain 06/10/2021   Abnormal CXR 03/10/2021   Ear pain, bilateral 03/10/2021   Morbid obesity (HCC) 03/10/2021   Subungual hematoma of second toe of left foot 10/28/2020   Insomnia 10/28/2020   Memory loss or impairment 09/26/2020   Forgetfulness 09/26/2020   Recurrent vertigo 09/26/2020   Abnormal MRI, cervical spine 03/04/2020   Right leg weakness 02/28/2020   Knee pain, right 10/23/2019   Cough 08/09/2019   Carpal tunnel syndrome 06/13/2019   Chronic pain syndrome 06/13/2019   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) 06/13/2019   Encounter for support and coordination of transition of care 04/14/2019   Neck pain 03/23/2019   Near syncope 12/05/2018   ESRD on hemodialysis (HCC) 11/22/2018   Ischemic heart disease 09/25/2018   Not currently working due to disabled status 06/13/2018   HTN (hypertension) 06/12/2018   Depression 03/09/2018   Adrenal  mass, left (HCC) 11/09/2017   MGUS (monoclonal gammopathy of unknown significance) 11/05/2017   Shoulder pain 11/01/2017   Chronic diastolic (congestive) heart failure (HCC) 09/20/2017   Bilateral leg weakness 09/09/2017   Adrenal adenoma 09/03/2017   Uncontrolled type 2 diabetes mellitus 08/18/2017   Unsteady gait 07/11/2017   Recurrent falls 07/11/2017   Anemia of chronic disease 03/24/2017   History of colonic polyps 02/10/2017   Dysphagia 11/05/2016   Irritable bowel syndrome 02/04/2016   Hospital discharge follow-up 10/27/2015  Intolerant of heat 10/07/2014   Cardiac murmur 01/17/2014   Back pain with left-sided radiculopathy 09/18/2013   Seasonal allergies 03/10/2013   Lipoma of back 12/22/2012   Other seasonal allergic rhinitis 08/22/2012   OSA (obstructive sleep apnea) 06/02/2011   Chronic kidney disease, stage 4 (severe) (HCC) 01/13/2011   Neuropathy 04/16/2010   Malaise and fatigue 07/24/2009   Personal history of other diseases of the nervous system and sense organs 05/08/2009   LUPUS ERYTHEMATOSUS, DISCOID 06/05/2008   Arm pain 05/30/2008   Hyperlipidemia 06/07/2007   Obesity 06/07/2007   Malignant hypertension 06/07/2007   Type 2 diabetes mellitus with hyperglycemia (HCC) 06/07/2007   Diabetes mellitus (HCC) 06/07/2007   Past Medical History:  Diagnosis Date   Acid reflux    Amputated left leg (HCC)    Anemia    Arthritis    Axillary masses    Soft tissue - status post excision   Back pain    Cancer (HCC)    CHF (congestive heart failure) (HCC)    COVID-19 virus infection 04/06/2019   Depression    End-stage renal disease (HCC)    M/W/F dialysis   Essential hypertension    Headache    years ago   History of blood transfusion    History of cardiac catheterization    Normal coronary arteries October 2020   History of claustrophobia    History of pneumonia 2019   Hypoxia 04/03/2019   Memory loss    Mixed hyperlipidemia    Obesity    Pancreatitis     Peritoneal dialysis catheter in place [facility])    Pneumonia due to COVID-19 virus 04/02/2019   Sleep apnea    Noncompliant with CPAP   Stroke (HCC)    mini stroke   Type 2 diabetes mellitus (HCC)     Family History  Problem Relation Age of Onset   Hypertension Father    Hypercholesterolemia Father    Arthritis Father    Hypertension Sister    Hypercholesterolemia Sister    Breast cancer Sister    Hypertension Sister    Colon cancer Neg Hx    Colon polyps Neg Hx     Past Surgical History:  Procedure Laterality Date   ABDOMINAL AORTOGRAM W/LOWER EXTREMITY N/A 04/30/2022   Procedure: ABDOMINAL AORTOGRAM W/LOWER EXTREMITY;  Surgeon: Leonie Douglas, MD;  Location: [facility] INV[facility] LAB;  Service: Cardiovascular;  Laterality: N/A;   ABDOMINAL AORTOGRAM W/LOWER EXTREMITY N/A 07/21/2022   Procedure: ABDOMINAL AORTOGRAM W/LOWER EXTREMITY;  Surgeon: Nada Libman, MD;  Location: [facility];  Service: Cardiovascular;  Laterality: N/A;   ABDOMINAL HYSTERECTOMY     ACHILLES TENDON LENGTHENING  08/15/2022   Procedure: ACHILLES TENDON LENGTHENING;  Surgeon: [name], DPM;  Location: [facility] OR;  Service: Podiatry;;   AMPUTATION Right 05/29/2022   Procedure: RIGHT BELOW THE KNEE AMPUTATION;  Surgeon: [name], MD;  Location[facility] OR;  Service: Orthopedics;  Laterality: Right;   AMPUTATION Left 09/04/2022   Procedure: AMPUTATION FOOT, serial irrigation;  Surgeon: Louann Sjogren, DPM;  Location: [facility] OR;  Service: Podiatry;  Laterality: Left;  Surgical team to do block   AMPUTATION Left 10/07/2022   Procedure: LEFT BELOW KNEE AMPUTATION;  Surgeon: [name], MD;  Location: [facility] OR;  Service: Orthopedics;  Laterality: Left;   AV FISTULA PLACEMENT Left 09/02/2017   Procedure: creation of left arm ARTERIOVENOUS (AV) FISTULA;  Surgeon: Nada Libman, MD;  Location: [facility] OR;  Service: Vascular;  Laterality: Left;   COLONOSCOPY  2008   Dr. Darrick Penna: normal    COLONOSCOPY N/A 12/18/2016   Dr.  Darrick Penna: multiple tubular adenomas, internal hemorrhoids. Surveillance in 3 years    ESOPHAGEAL DILATION N/A 10/13/2015   Procedure: ESOPHAGEAL DILATION;  Surgeon: Malissa Hippo, MD;  [facility];  Service: Endoscopy;  Laterality: N/A;   ESOPHAGOGASTRODUODENOSCOPY N/A 10/13/2015   Dr. Karilyn Cota: chronic gastritis on path, no H.pylori. Empiric dilation    ESOPHAGOGASTRODUODENOSCOPY N/A 12/18/2016   Dr. Darrick Penna: mild gastritis. BRAVO study revealed uncontrolled GERD. Dysphagia secondary to uncontrolled reflux   FOOT SURGERY Bilateral    "nerve"     LEFT HEART CATH AND CORONARY ANGIOGRAPHY N/A 12/29/2018   Procedure: LEFT HEART CATH AND CORONARY ANGIOGRAPHY;  Surgeon: Corky Crafts, MD;  Location: [facility] INVASIVE CV LAB;  Service: Cardiovascular;  Laterality: N/A;   LOWER EXTREMITY ANGIOGRAPHY Right 05/04/2022   Procedure: Lower Extremity Angiography;  Surgeon: Victorino Sparrow, MD;  Location: [facility] INVASIVE CV LAB;  Service: Cardiovascular;  Laterality: Right;   LUNG BIOPSY     MASS EXCISION Right 01/09/2013   Procedure: EXCISION OF NEOPLASM OF RIGHT  AXILLA  AND EXCISION OF NEOPLASM OF LEFT AXILLA;  Surgeon: Dalia Heading, MD;  Location: [facility] ORS;  Service: General;  Laterality: Right;  procedure end @ 08:23   MYRINGOTOMY WITH TUBE PLACEMENT Bilateral 04/28/2017   Procedure: BILATERAL MYRINGOTOMY WITH TUBE PLACEMENT;  Surgeon: Newman Pies, MD;  Location: [facility] OR;  Service: ENT;  Laterality: Bilateral;   PERIPHERAL VASCULAR BALLOON ANGIOPLASTY Right 05/04/2022   Procedure: PERIPHERAL VASCULAR BALLOON ANGIOPLASTY;  Surgeon: Victorino Sparrow, MD;  Location: [facility] INVASIVE CV LAB;  Service: Cardiovascular;  Laterality: Right;  PT   PERIPHERAL VASCULAR INTERVENTION Right 05/04/2022   Procedure: PERIPHERAL VASCULAR INTERVENTION;  Surgeon: Victorino Sparrow, MD;  Location: [facility] INVASIVE CV LAB;  Service: Cardiovascular;  Laterality: Right;  SFA   PERIPHERAL VASCULAR INTERVENTION Left 07/21/2022   Procedure:  PERIPHERAL VASCULAR INTERVENTION;  Surgeon: Nada Libman, MD;  [facility];  Service: Cardiovascular;  Laterality: Left;   REVISION OF ARTERIOVENOUS GORETEX GRAFT Left 05/04/2018   Procedure: TRANSPOSITION OF CEPHALIC VEIN ARTERIOVENOUS FISTULA LEFT ARM;  Surgeon: Larina Earthly, MD;  Location: [facility] OR;  Service: Vascular;  Laterality: Left;   SAVORY DILATION N/A 12/18/2016   Procedure: SAVORY DILATION;  Surgeon: West Bali, MD;  [facility];  Service: Endoscopy;  Laterality: N/A;   TRANSMETATARSAL AMPUTATION Left 08/15/2022   Procedure: TRANSMETATARSAL AMPUTATION;  Surgeon: [name], DPM;  Location: [facility] OR;  Service: Podiatry;  Laterality: Left;   Social History   Occupational History   Occupation: retired   Tobacco Use   Smoking status: Never    Passive exposure: Never   Smokeless tobacco: Never   Tobacco comments:    Verified by Daughter, [name]  Vaping Use   Vaping status: Never Used  Substance and Sexual Activity   Alcohol use: No   Drug use: No   Sexual activity: Not Currently    Partners: Male

## 2023-02-03 DIAGNOSIS — N186 End stage renal disease: Secondary | ICD-10-CM | POA: Diagnosis not present

## 2023-02-03 DIAGNOSIS — Z992 Dependence on renal dialysis: Secondary | ICD-10-CM | POA: Diagnosis not present

## 2023-02-03 DIAGNOSIS — N2581 Secondary hyperparathyroidism of renal origin: Secondary | ICD-10-CM | POA: Diagnosis not present

## 2023-02-04 DIAGNOSIS — E1151 Type 2 diabetes mellitus with diabetic peripheral angiopathy without gangrene: Secondary | ICD-10-CM | POA: Diagnosis not present

## 2023-02-04 DIAGNOSIS — I110 Hypertensive heart disease with heart failure: Secondary | ICD-10-CM | POA: Diagnosis not present

## 2023-02-04 DIAGNOSIS — M62522 Muscle wasting and atrophy, not elsewhere classified, left upper arm: Secondary | ICD-10-CM | POA: Diagnosis not present

## 2023-02-04 DIAGNOSIS — I5032 Chronic diastolic (congestive) heart failure: Secondary | ICD-10-CM | POA: Diagnosis not present

## 2023-02-04 DIAGNOSIS — Z89512 Acquired absence of left leg below knee: Secondary | ICD-10-CM | POA: Diagnosis not present

## 2023-02-04 DIAGNOSIS — E1122 Type 2 diabetes mellitus with diabetic chronic kidney disease: Secondary | ICD-10-CM | POA: Diagnosis not present

## 2023-02-04 DIAGNOSIS — N186 End stage renal disease: Secondary | ICD-10-CM | POA: Diagnosis not present

## 2023-02-04 DIAGNOSIS — D631 Anemia in chronic kidney disease: Secondary | ICD-10-CM | POA: Diagnosis not present

## 2023-02-04 DIAGNOSIS — T8781 Dehiscence of amputation stump: Secondary | ICD-10-CM | POA: Diagnosis not present

## 2023-02-05 DIAGNOSIS — N2581 Secondary hyperparathyroidism of renal origin: Secondary | ICD-10-CM | POA: Diagnosis not present

## 2023-02-05 DIAGNOSIS — N186 End stage renal disease: Secondary | ICD-10-CM | POA: Diagnosis not present

## 2023-02-05 DIAGNOSIS — Z992 Dependence on renal dialysis: Secondary | ICD-10-CM | POA: Diagnosis not present

## 2023-02-08 ENCOUNTER — Ambulatory Visit: Payer: Self-pay | Admitting: *Deleted

## 2023-02-08 ENCOUNTER — Encounter: Payer: Self-pay | Admitting: *Deleted

## 2023-02-08 DIAGNOSIS — N2581 Secondary hyperparathyroidism of renal origin: Secondary | ICD-10-CM | POA: Diagnosis not present

## 2023-02-08 DIAGNOSIS — N186 End stage renal disease: Secondary | ICD-10-CM | POA: Diagnosis not present

## 2023-02-08 DIAGNOSIS — Z992 Dependence on renal dialysis: Secondary | ICD-10-CM | POA: Diagnosis not present

## 2023-02-08 NOTE — Patient Instructions (Signed)
Visit Information  Thank you for taking time to visit with me today. Please don't hesitate to contact me if I can be of assistance to you.   Following are the goals we discussed today:   Goals Addressed             This Visit's Progress    Receive Assistance Applying for Aid & Attendance Benefits, through [employer].   On track    Care Coordination Interventions:  Interventions Today    Flowsheet Row Most Recent Value  Chronic Disease   Chronic disease during today's visit Diabetes, Congestive Heart Failure (CHF), Hypertension (HTN), Chronic Kidney Disease/End Stage Renal Disease (ESRD), Other  [Neuropathy, Chronic Pain Syndrome, Anxiety, Depression, Memory Loss, Neurocognitive Disorder & Requires Maximum Assistance with Activities of Daily Living, Frequent Falls]  General Interventions   General Interventions Discussed/Reviewed General Interventions Discussed, General Interventions Reviewed, Durable Medical Equipment (DME), Community Resources, Level of Care, Doctor Visits, Communication with  [employer] with Care Team Members]  Doctor Visits Discussed/Reviewed Doctor Visits Discussed, Specialist, Doctor Visits Reviewed, Annual Wellness Visits, PCP  [Encouraged Routine Engagement]  Horticulturist, commercial (DME) Bed side commode, BP Cuff, Glucomoter, Tour manager, Biomedical scientist, Environmental consultant, Other  [Prescription Eyeglasses, Medical laboratory scientific officer, Sports administrator, Raised Engineer, civil (consulting) with Lid, Manufacturing systems engineer, Fish farm manager, Etc.]  Wheelchair Standard  PCP/Specialist Visits Compliance with follow-up visit  [Encouraged]  Communication with PCP/Specialists  Level of Care Adult Daycare, Assisted Living, Personal Care Services  [Revisited]  Applications Medicaid, Personal Care Services, Other  [Encouraged Initiation of Application for Aid & Attendance Benefits]  Education Interventions   Education Provided Provided Education  Provided Verbal Education On Nutrition, Mental Health/Coping with  Illness, When to see the doctor, Air traffic controller, Development worker, community, Research officer, political party, Personal Care Services, Other  [Encouraged Initiation of Application for Aid & Attendance Benefits]  Mental Health Interventions   Mental Health Discussed/Reviewed Mental Health Discussed, Anxiety, Depression, Mental Health Reviewed, Grief and Loss, Substance Abuse, Coping Strategies, Suicide, Crisis  [Assessed]  Pharmacy Interventions   Pharmacy Dicussed/Reviewed Pharmacy Topics Discussed, Pharmacy Topics Reviewed, Medication Adherence  [Encouraged Adherence]  Safety Interventions   Safety Discussed/Reviewed Safety Discussed, Safety Reviewed, Fall Risk, Home Safety  Home Safety Assistive Devices  [Encouraged Routine Use of Assistive Devices]  Advanced Directive Interventions   Advanced Directives Discussed/Reviewed Advanced Directives Discussed, Advanced Directives Reviewed  [Encouraged Initiation, Offering Assistance with Providing Packet & Completion]      Active Listening & Reflection Utilized.  Verbalization of Feelings Encouraged. Emotional Support Provided. Problem Solving Interventions Indicated. Cognitive Behavioral Therapy Implemented.  Acceptance & Commitment Therapy Initiated. CSW Collaboration with Daughter, [name] to [facility] Services in [place], through [facility] [phone]). CSW Collaboration with Daughter, [name] to Confirm Well-Healed Right Below Knee Amputation. CSW Collaboration with Daughter, [name] to Confirm Patient is Wearing 2 XL Stump Shrinker for Status-Post Left Below Knee Amputation. CSW Collaboration with Daughter, [name] to Confirm Continued Receipt of 24 Hour Care & Supervision in The Home for Patient, through Private [employer] Care[employer]. CSW Collaboration with Daughter, [name] to Encourage Engagement with Danford Bad, Licensed Clinical Social Worker with  [facility] [phone]), if She Has Questions, Needs Assistance, or If Additional Social Work Needs Are Identified Between Now & Our Next Follow-Up Outreach Call, Scheduled on 03/08/2023 at 9:00 AM.      Our next appointment is by telephone on 03/08/2023 at 9:00 am.  Please call the care guide  team at [phone] if you need to cancel or reschedule your appointment.   If you are experiencing a Mental Health or Behavioral Health Crisis or need someone to talk to, please call the Suicide and Crisis Lifeline: 988 call the [place] National Suicide Prevention Lifeline: 531-750-2795 or TTY: 416-748-0563 TTY 737-272-9270) to talk to a trained counselor call 1-800-273-TALK (toll free, 24 hour hotline) go to [facility] [name] [place] [phone]) call the [facility] Crisis Line: [phone] call 911  Patient verbalizes understanding of instructions and care plan provided today and agrees to view in MyChart. Active MyChart status and patient understanding of how to access instructions and care plan via MyChart confirmed with patient.     Telephone follow up appointment with care management team member scheduled for:  03/08/2023 at 9:00 am.  Danford Bad, BSW, MSW, LCSW  Embedded Practice Social Work Case Manager  [facility], Population Health Direct Dial: [phone]  Fax: [phone] Email: [email or web address] Website: [email or web address]

## 2023-02-08 NOTE — Patient Outreach (Signed)
Care Coordination   Follow Up Visit Note   02/08/2023  Name: Kathryn Beck MRN: [id number] DOB: [birth year]  Kathryn Beck is a 67 y.o. year old female who sees [name], MD for primary care. I spoke with patient's daughter, [name] by phone today.  What matters to the patients health and wellness today?  Receive Assistance Applying for Aid & Attendance Benefits, through [employer].    Goals Addressed             This Visit's Progress    Receive Assistance Applying for Aid & Attendance Benefits, through [employer].   On track    Care Coordination Interventions:  Interventions Today    Flowsheet Row Most Recent Value  Chronic Disease   Chronic disease during today's visit Diabetes, Congestive Heart Failure (CHF), Hypertension (HTN), Chronic Kidney Disease/End Stage Renal Disease (ESRD), Other  [Neuropathy, Chronic Pain Syndrome, Anxiety, Depression, Memory Loss, Neurocognitive Disorder & Requires Maximum Assistance with Activities of Daily Living, Frequent Falls]  General Interventions   General Interventions Discussed/Reviewed General Interventions Discussed, General Interventions Reviewed, Durable Medical Equipment (DME), Community Resources, Level of Care, Doctor Visits, Communication with  [employer] with Care Team Members]  Doctor Visits Discussed/Reviewed Doctor Visits Discussed, Specialist, Doctor Visits Reviewed, Annual Wellness Visits, PCP  [Encouraged Routine Engagement]  Horticulturist, commercial (DME) Bed side commode, BP Cuff, Glucomoter, Tour manager, Biomedical scientist, Environmental consultant, Other  [Prescription Eyeglasses, Medical laboratory scientific officer, Sports administrator, Raised Engineer, civil (consulting) with Lid, Manufacturing systems engineer, Fish farm manager, Etc.]  Wheelchair Standard  PCP/Specialist Visits Compliance with follow-up visit  [Encouraged]  Communication with PCP/Specialists  Level of Care Adult Daycare, Assisted Living, Personal Care Services  [Revisited]  Applications  Medicaid, Personal Care Services, Other  [Encouraged Initiation of Application for Aid & Attendance Benefits]  Education Interventions   Education Provided Provided Education  Provided Verbal Education On Nutrition, Mental Health/Coping with Illness, When to see the doctor, Air traffic controller, Development worker, community, Research officer, political party, Personal Care Services, Other  [Encouraged Initiation of Application for Aid & Attendance Benefits]  Mental Health Interventions   Mental Health Discussed/Reviewed Mental Health Discussed, Anxiety, Depression, Mental Health Reviewed, Grief and Loss, Substance Abuse, Coping Strategies, Suicide, Crisis  [Assessed]  Pharmacy Interventions   Pharmacy Dicussed/Reviewed Pharmacy Topics Discussed, Pharmacy Topics Reviewed, Medication Adherence  [Encouraged Adherence]  Safety Interventions   Safety Discussed/Reviewed Safety Discussed, Safety Reviewed, Fall Risk, Home Safety  Home Safety Assistive Devices  [Encouraged Routine Use of Assistive Devices]  Advanced Directive Interventions   Advanced Directives Discussed/Reviewed Advanced Directives Discussed, Advanced Directives Reviewed  [Encouraged Initiation, Offering Assistance with Providing Packet & Completion]      Active Listening & Reflection Utilized.  Verbalization of Feelings Encouraged. Emotional Support Provided. Problem Solving Interventions Indicated. Cognitive Behavioral Therapy Implemented.  Acceptance & Commitment Therapy Initiated. CSW Collaboration with Daughter, [name] to [facility] in [place], through [facility] [phone]). CSW Collaboration with Daughter, [name] to Confirm Well-Healed Right Below Knee Amputation. CSW Collaboration with Daughter, [name] to Confirm Patient is Wearing 2 XL Stump Shrinker for Status-Post Left Below Knee Amputation. CSW Collaboration with Daughter, [name] to  Confirm Continued Receipt of 24 Hour Care & Supervision in The Home for Patient, through Private Pay Caregivers. CSW Collaboration with Daughter, [name] to Encourage Engagement with Danford Bad, Licensed Clinical Social Worker with [facility] [phone]), if She Has Questions, Needs Assistance, or If Additional Social Work Needs Are  Identified Between Now & Our Next Follow-Up Outreach Call, Scheduled on 03/08/2023 at 9:00 AM.        SDOH assessments and interventions completed:  Yes.  SDOH Interventions Today    Flowsheet Row Most Recent Value  SDOH Interventions   Food Insecurity Interventions Intervention Not Indicated  Housing Interventions Intervention Not Indicated  Transportation Interventions Intervention Not Indicated, Patient Resources (Friends/Family), Payor Benefit  Utilities Interventions Intervention Not Indicated  Alcohol Usage Interventions Intervention Not Indicated (Score <7)  Depression Interventions/Treatment  Medication, Counseling, Currently on Treatment  Financial Strain Interventions Intervention Not Indicated  Physical Activity Interventions Patient Declined  Stress Interventions Intervention Not Indicated  Social Connections Interventions Intervention Not Indicated  Health Literacy Interventions Intervention Not Indicated     Care Coordination Interventions:  Yes, provided.   Follow up plan: Follow up call scheduled for 03/08/2023 at 9:00 am.  Encounter Outcome:  Patient Visit Completed.   Danford Bad, BSW, MSW, Printmaker Social Work Case Set designer Health  [facility], Population Health Direct Dial: [phone]  Fax: [phone] Email: [email or web address]: [email or web address]

## 2023-02-10 DIAGNOSIS — Z992 Dependence on renal dialysis: Secondary | ICD-10-CM | POA: Diagnosis not present

## 2023-02-10 DIAGNOSIS — N186 End stage renal disease: Secondary | ICD-10-CM | POA: Diagnosis not present

## 2023-02-10 DIAGNOSIS — N2581 Secondary hyperparathyroidism of renal origin: Secondary | ICD-10-CM | POA: Diagnosis not present

## 2023-02-11 ENCOUNTER — Ambulatory Visit: Payer: Medicare PPO | Admitting: Orthopedic Surgery

## 2023-02-11 DIAGNOSIS — D631 Anemia in chronic kidney disease: Secondary | ICD-10-CM | POA: Diagnosis not present

## 2023-02-11 DIAGNOSIS — I5032 Chronic diastolic (congestive) heart failure: Secondary | ICD-10-CM | POA: Diagnosis not present

## 2023-02-11 DIAGNOSIS — E1122 Type 2 diabetes mellitus with diabetic chronic kidney disease: Secondary | ICD-10-CM | POA: Diagnosis not present

## 2023-02-11 DIAGNOSIS — M62522 Muscle wasting and atrophy, not elsewhere classified, left upper arm: Secondary | ICD-10-CM | POA: Diagnosis not present

## 2023-02-11 DIAGNOSIS — N186 End stage renal disease: Secondary | ICD-10-CM | POA: Diagnosis not present

## 2023-02-11 DIAGNOSIS — I110 Hypertensive heart disease with heart failure: Secondary | ICD-10-CM | POA: Diagnosis not present

## 2023-02-11 DIAGNOSIS — E1151 Type 2 diabetes mellitus with diabetic peripheral angiopathy without gangrene: Secondary | ICD-10-CM | POA: Diagnosis not present

## 2023-02-11 DIAGNOSIS — Z89512 Acquired absence of left leg below knee: Secondary | ICD-10-CM | POA: Diagnosis not present

## 2023-02-11 DIAGNOSIS — T8781 Dehiscence of amputation stump: Secondary | ICD-10-CM | POA: Diagnosis not present

## 2023-02-12 DIAGNOSIS — Z992 Dependence on renal dialysis: Secondary | ICD-10-CM | POA: Diagnosis not present

## 2023-02-12 DIAGNOSIS — N2581 Secondary hyperparathyroidism of renal origin: Secondary | ICD-10-CM | POA: Diagnosis not present

## 2023-02-12 DIAGNOSIS — N186 End stage renal disease: Secondary | ICD-10-CM | POA: Diagnosis not present

## 2023-02-15 DIAGNOSIS — N2581 Secondary hyperparathyroidism of renal origin: Secondary | ICD-10-CM | POA: Diagnosis not present

## 2023-02-15 DIAGNOSIS — Z992 Dependence on renal dialysis: Secondary | ICD-10-CM | POA: Diagnosis not present

## 2023-02-15 DIAGNOSIS — N186 End stage renal disease: Secondary | ICD-10-CM | POA: Diagnosis not present

## 2023-02-16 DIAGNOSIS — D631 Anemia in chronic kidney disease: Secondary | ICD-10-CM | POA: Diagnosis not present

## 2023-02-16 DIAGNOSIS — E1151 Type 2 diabetes mellitus with diabetic peripheral angiopathy without gangrene: Secondary | ICD-10-CM | POA: Diagnosis not present

## 2023-02-16 DIAGNOSIS — N186 End stage renal disease: Secondary | ICD-10-CM | POA: Diagnosis not present

## 2023-02-16 DIAGNOSIS — Z89512 Acquired absence of left leg below knee: Secondary | ICD-10-CM | POA: Diagnosis not present

## 2023-02-16 DIAGNOSIS — M62522 Muscle wasting and atrophy, not elsewhere classified, left upper arm: Secondary | ICD-10-CM | POA: Diagnosis not present

## 2023-02-16 DIAGNOSIS — I5032 Chronic diastolic (congestive) heart failure: Secondary | ICD-10-CM | POA: Diagnosis not present

## 2023-02-16 DIAGNOSIS — E1122 Type 2 diabetes mellitus with diabetic chronic kidney disease: Secondary | ICD-10-CM | POA: Diagnosis not present

## 2023-02-16 DIAGNOSIS — I110 Hypertensive heart disease with heart failure: Secondary | ICD-10-CM | POA: Diagnosis not present

## 2023-02-16 DIAGNOSIS — T8781 Dehiscence of amputation stump: Secondary | ICD-10-CM | POA: Diagnosis not present

## 2023-02-17 DIAGNOSIS — Z992 Dependence on renal dialysis: Secondary | ICD-10-CM | POA: Diagnosis not present

## 2023-02-17 DIAGNOSIS — N2581 Secondary hyperparathyroidism of renal origin: Secondary | ICD-10-CM | POA: Diagnosis not present

## 2023-02-17 DIAGNOSIS — N186 End stage renal disease: Secondary | ICD-10-CM | POA: Diagnosis not present

## 2023-02-18 DIAGNOSIS — Z89511 Acquired absence of right leg below knee: Secondary | ICD-10-CM | POA: Diagnosis not present

## 2023-02-18 DIAGNOSIS — I96 Gangrene, not elsewhere classified: Secondary | ICD-10-CM | POA: Diagnosis not present

## 2023-02-18 DIAGNOSIS — S88912A Complete traumatic amputation of left lower leg, level unspecified, initial encounter: Secondary | ICD-10-CM | POA: Diagnosis not present

## 2023-02-19 DIAGNOSIS — N186 End stage renal disease: Secondary | ICD-10-CM | POA: Diagnosis not present

## 2023-02-19 DIAGNOSIS — N2581 Secondary hyperparathyroidism of renal origin: Secondary | ICD-10-CM | POA: Diagnosis not present

## 2023-02-19 DIAGNOSIS — Z992 Dependence on renal dialysis: Secondary | ICD-10-CM | POA: Diagnosis not present

## 2023-02-20 DIAGNOSIS — N186 End stage renal disease: Secondary | ICD-10-CM | POA: Diagnosis not present

## 2023-02-20 DIAGNOSIS — Z992 Dependence on renal dialysis: Secondary | ICD-10-CM | POA: Diagnosis not present

## 2023-02-22 ENCOUNTER — Other Ambulatory Visit: Payer: Self-pay | Admitting: Internal Medicine

## 2023-02-22 DIAGNOSIS — I10 Essential (primary) hypertension: Secondary | ICD-10-CM

## 2023-02-22 DIAGNOSIS — N186 End stage renal disease: Secondary | ICD-10-CM | POA: Diagnosis not present

## 2023-02-22 DIAGNOSIS — Z992 Dependence on renal dialysis: Secondary | ICD-10-CM | POA: Diagnosis not present

## 2023-02-24 DIAGNOSIS — N186 End stage renal disease: Secondary | ICD-10-CM | POA: Diagnosis not present

## 2023-02-24 DIAGNOSIS — Z992 Dependence on renal dialysis: Secondary | ICD-10-CM | POA: Diagnosis not present

## 2023-02-25 ENCOUNTER — Ambulatory Visit (INDEPENDENT_AMBULATORY_CARE_PROVIDER_SITE_OTHER): Payer: Medicare PPO | Admitting: Orthopedic Surgery

## 2023-02-25 DIAGNOSIS — Z89512 Acquired absence of left leg below knee: Secondary | ICD-10-CM | POA: Diagnosis not present

## 2023-02-25 DIAGNOSIS — T8781 Dehiscence of amputation stump: Secondary | ICD-10-CM | POA: Diagnosis not present

## 2023-02-25 DIAGNOSIS — Z89511 Acquired absence of right leg below knee: Secondary | ICD-10-CM

## 2023-02-25 DIAGNOSIS — M62522 Muscle wasting and atrophy, not elsewhere classified, left upper arm: Secondary | ICD-10-CM | POA: Diagnosis not present

## 2023-02-25 DIAGNOSIS — N186 End stage renal disease: Secondary | ICD-10-CM | POA: Diagnosis not present

## 2023-02-25 DIAGNOSIS — E1151 Type 2 diabetes mellitus with diabetic peripheral angiopathy without gangrene: Secondary | ICD-10-CM | POA: Diagnosis not present

## 2023-02-25 DIAGNOSIS — D631 Anemia in chronic kidney disease: Secondary | ICD-10-CM | POA: Diagnosis not present

## 2023-02-25 DIAGNOSIS — I110 Hypertensive heart disease with heart failure: Secondary | ICD-10-CM | POA: Diagnosis not present

## 2023-02-25 DIAGNOSIS — E1122 Type 2 diabetes mellitus with diabetic chronic kidney disease: Secondary | ICD-10-CM | POA: Diagnosis not present

## 2023-02-25 DIAGNOSIS — I5032 Chronic diastolic (congestive) heart failure: Secondary | ICD-10-CM | POA: Diagnosis not present

## 2023-02-26 DIAGNOSIS — Z992 Dependence on renal dialysis: Secondary | ICD-10-CM | POA: Diagnosis not present

## 2023-02-26 DIAGNOSIS — N186 End stage renal disease: Secondary | ICD-10-CM | POA: Diagnosis not present

## 2023-02-28 DIAGNOSIS — Z992 Dependence on renal dialysis: Secondary | ICD-10-CM | POA: Diagnosis not present

## 2023-02-28 DIAGNOSIS — E1140 Type 2 diabetes mellitus with diabetic neuropathy, unspecified: Secondary | ICD-10-CM | POA: Diagnosis not present

## 2023-02-28 DIAGNOSIS — Z89511 Acquired absence of right leg below knee: Secondary | ICD-10-CM | POA: Diagnosis not present

## 2023-02-28 DIAGNOSIS — R2689 Other abnormalities of gait and mobility: Secondary | ICD-10-CM | POA: Diagnosis not present

## 2023-02-28 DIAGNOSIS — M6281 Muscle weakness (generalized): Secondary | ICD-10-CM | POA: Diagnosis not present

## 2023-02-28 DIAGNOSIS — I5032 Chronic diastolic (congestive) heart failure: Secondary | ICD-10-CM | POA: Diagnosis not present

## 2023-02-28 DIAGNOSIS — N186 End stage renal disease: Secondary | ICD-10-CM | POA: Diagnosis not present

## 2023-02-28 DIAGNOSIS — R296 Repeated falls: Secondary | ICD-10-CM | POA: Diagnosis not present

## 2023-02-28 DIAGNOSIS — Z4781 Encounter for orthopedic aftercare following surgical amputation: Secondary | ICD-10-CM | POA: Diagnosis not present

## 2023-03-01 ENCOUNTER — Encounter: Payer: Self-pay | Admitting: Orthopedic Surgery

## 2023-03-01 DIAGNOSIS — N186 End stage renal disease: Secondary | ICD-10-CM | POA: Diagnosis not present

## 2023-03-01 DIAGNOSIS — Z992 Dependence on renal dialysis: Secondary | ICD-10-CM | POA: Diagnosis not present

## 2023-03-01 NOTE — Progress Notes (Signed)
Office Visit Note   Patient: Kathryn Beck           Date of Birth: 09-Oct-1955           MRN: [id number] Visit Date: 02/25/2023              Requested by: [name], MD [address] [place],  [place] [address] PCP: [name], MD  Chief Complaint  Patient presents with   Right Leg - Follow-up    Bilateral bka   Left Leg - Follow-up      HPI: Patient is a 67 year old woman who is seen in follow-up for bilateral transtibial amputations.  She is currently wearing a stump shrinker she states her left leg is sore.  She does have her prosthetic legs but is currently not wearing them.  Assessment & Plan: Visit Diagnoses:  1. Right below-knee amputee (HCC)   2. Left below-knee amputee [facility])     Plan: Recommend continuing with the stump shrinker increase the time she wears the silicone liner and the time she wears the prosthetic legs.  Follow-Up Instructions: Return in about 4 weeks (around 03/25/2023).   Ortho Exam  Patient is alert, oriented, no adenopathy, well-dressed, normal affect, normal respiratory effort. Examination the right transtibial amputation is well-healed.  On the left leg she has an ulcer that is 1 cm in diameter 90% granulation tissue there is no depth no cellulitis no drainage.  She is currently wearing 3 shrinkers on the left.  Recommended continue routine wound care.  Imaging: No results found. No images are attached to the encounter.  Labs: Lab Results  Component Value Date   HGBA1C 7.7 (A) 12/15/2022   HGBA1C 6.3 (H) 07/07/2022   HGBA1C 9.3 (A) 04/16/2022   ESRSEDRATE 70 (H) 09/03/2022   ESRSEDRATE 79 (H) 08/13/2022   ESRSEDRATE 37 (H) 04/25/2022   CRP 0.9 09/03/2022   CRP 9.1 (H) 08/13/2022   CRP 29.4 (H) 04/25/2022   LABURIC 1.8 (L) 06/07/2018   LABURIC 6.2 04/22/2018   LABURIC 9.3 (H) 10/07/2015   REPTSTATUS 09/09/2022 FINAL 09/04/2022   GRAMSTAIN  09/04/2022    RARE WBC PRESENT, PREDOMINANTLY PMN NO  ORGANISMS SEEN    CULT  09/04/2022    RARE ENTEROBACTER HORMAECHEI NO ANAEROBES ISOLATED Performed at [facility], [address] [place], [place] [address]    LABORGA ENTEROBACTER HORMAECHEI 09/04/2022     Lab Results  Component Value Date   ALBUMIN 3.1 (L) 12/28/2022   ALBUMIN 3.2 (L) 12/17/2022   ALBUMIN 3.8 (L) 12/15/2022   PREALBUMIN 26 08/13/2022   PREALBUMIN 24 04/25/2022    Lab Results  Component Value Date   MG 2.2 12/28/2022   MG 2.1 11/19/2022   MG 1.9 09/08/2022   Lab Results  Component Value Date   VD25OH 30.9 09/28/2017   VD25OH 15 (L) 10/07/2015   VD25OH 16 (L) 04/25/2015    Lab Results  Component Value Date   PREALBUMIN 26 08/13/2022   PREALBUMIN 24 04/25/2022      Latest Ref Rng & Units 12/28/2022    8:42 AM 12/17/2022    3:43 PM 12/15/2022    1:40 PM  CBC EXTENDED  WBC 4.0 - 10.5 K/uL 11.6  7.5  7.2   RBC 3.87 - 5.11 MIL/uL 4.52  4.59  4.58   Hemoglobin 12.0 - 15.0 g/dL 82.9  56.2  13.0   HCT 36.0 - 46.0 % 36.2  36.5  37.0  Platelets 150 - 400 K/uL 262  309  354   NEUT# 1.7 - 7.7 K/uL 10.2  6.0  5.6   Lymph# 0.7 - 4.0 K/uL 0.8  0.8  1.0      There is no height or weight on file to calculate BMI.  Orders:  No orders of the defined types were placed in this encounter.  No orders of the defined types were placed in this encounter.    Procedures: No procedures performed  Clinical Data: No additional findings.  ROS:  All other systems negative, except as noted in the HPI. Review of Systems  Objective: Vital Signs: There were no vitals taken for this visit.  Specialty Comments:  No specialty comments available.  PMFS History: Patient Active Problem List   Diagnosis Date Noted   Volume overload 12/28/2022   Fall at home 12/18/2022   CVA (cerebral vascular accident) (HCC) 12/17/2022   Acute confusion 12/10/2022   Poor appetite 12/10/2022   Chills 12/10/2022   Generalized weakness 11/18/2022   Paronychia of finger  of right hand 11/13/2022   Dehiscence of amputation stump of left lower extremity (HCC) 10/07/2022   Status post below-knee amputation of left lower extremity (HCC) 10/07/2022   Subacute osteomyelitis, left ankle and foot (HCC) 09/03/2022   Left foot infection 09/03/2022   Amputated left leg (HCC) 09/03/2022   Wound infection after surgery 09/03/2022   Equinus contracture of left ankle 08/15/2022   Acute osteomyelitis of toe of left foot (HCC) 08/13/2022   Peripheral vascular complication 07/21/2022   PVD (peripheral vascular disease) (HCC) 07/18/2022   Toe ulcer, left, limited to breakdown of skin (HCC) 07/13/2022   S/P BKA (below knee amputation), right (HCC) 05/29/2022   Confusion 05/19/2022   Visual hallucinations 05/19/2022   Watery eyes 05/19/2022   Requires assistance with activities of daily living (ADL) 04/26/2022   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   Gangrene of right foot (HCC) 04/25/2022   PAD (peripheral artery disease) (HCC) 04/25/2022   Other osteoporosis without current pathological fracture 11/10/2021   Hypocalcemia 11/10/2021   Fall 11/02/2021   Left foot pain 10/31/2021   Low back pain with left-sided sciatica 10/31/2021   Left ear impacted cerumen 09/18/2021   Chronic left shoulder pain 06/10/2021   Abnormal CXR 03/10/2021   Ear pain, bilateral 03/10/2021   Morbid obesity (HCC) 03/10/2021   Subungual hematoma of second toe of left foot 10/28/2020   Insomnia 10/28/2020   Memory loss or impairment 09/26/2020   Forgetfulness 09/26/2020   Recurrent vertigo 09/26/2020   Abnormal MRI, cervical spine 03/04/2020   Right leg weakness 02/28/2020   Knee pain, right 10/23/2019   Cough 08/09/2019   Carpal tunnel syndrome 06/13/2019   Chronic pain syndrome 06/13/2019   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) 06/13/2019   Encounter for support and coordination of transition of care 04/14/2019   Neck pain 03/23/2019   Near syncope  12/05/2018   ESRD on hemodialysis (HCC) 11/22/2018   Ischemic heart disease 09/25/2018   Not currently working due to disabled status 06/13/2018   HTN (hypertension) 06/12/2018   Depression 03/09/2018   Adrenal mass, left (HCC) 11/09/2017   MGUS (monoclonal gammopathy of unknown significance) 11/05/2017   Shoulder pain 11/01/2017   Chronic diastolic (congestive) heart failure (HCC) 09/20/2017   Bilateral leg weakness 09/09/2017   Adrenal adenoma 09/03/2017   Uncontrolled type 2 diabetes mellitus 08/18/2017   Unsteady gait 07/11/2017   Recurrent falls 07/11/2017  Anemia of chronic disease 03/24/2017   History of colonic polyps 02/10/2017   Dysphagia 11/05/2016   Irritable bowel syndrome 02/04/2016   Hospital discharge follow-up 10/27/2015   Intolerant of heat 10/07/2014   Cardiac murmur 01/17/2014   Back pain with left-sided radiculopathy 09/18/2013   Seasonal allergies 03/10/2013   Lipoma of back 12/22/2012   Other seasonal allergic rhinitis 08/22/2012   OSA (obstructive sleep apnea) 06/02/2011   Chronic kidney disease, stage 4 (severe) (HCC) 01/13/2011   Neuropathy 04/16/2010   Malaise and fatigue 07/24/2009   Personal history of other diseases of the nervous system and sense organs 05/08/2009   LUPUS ERYTHEMATOSUS, DISCOID 06/05/2008   Arm pain 05/30/2008   Hyperlipidemia 06/07/2007   Obesity 06/07/2007   Malignant hypertension 06/07/2007   Type 2 diabetes mellitus with hyperglycemia (HCC) 06/07/2007   Diabetes mellitus (HCC) 06/07/2007   Past Medical History:  Diagnosis Date   Acid reflux    Amputated left leg (HCC)    Anemia    Arthritis    Axillary masses    Soft tissue - status post excision   Back pain    Cancer (HCC)    CHF (congestive heart failure) (HCC)    COVID-19 virus infection 04/06/2019   Depression    End-stage renal disease (HCC)    M/W/F dialysis   Essential hypertension    Headache    years ago   History of blood transfusion    History of  cardiac catheterization    Normal coronary arteries October 2020   History of claustrophobia    History of pneumonia 2019   Hypoxia 04/03/2019   Memory loss    Mixed hyperlipidemia    Obesity    Pancreatitis    Peritoneal dialysis catheter in place [facility])    Pneumonia due to COVID-19 virus 04/02/2019   Sleep apnea    Noncompliant with CPAP   Stroke (HCC)    mini stroke   Type 2 diabetes mellitus (HCC)     Family History  Problem Relation Age of Onset   Hypertension Father    Hypercholesterolemia Father    Arthritis Father    Hypertension Sister    Hypercholesterolemia Sister    Breast cancer Sister    Hypertension Sister    Colon cancer Neg Hx    Colon polyps Neg Hx     Past Surgical History:  Procedure Laterality Date   ABDOMINAL AORTOGRAM W/LOWER EXTREMITY N/A 04/30/2022   Procedure: ABDOMINAL AORTOGRAM W/LOWER EXTREMITY;  Surgeon: Leonie Douglas, MD;  Location: [facility];  Service: Cardiovascular;  Laterality: N/A;   ABDOMINAL AORTOGRAM W/LOWER EXTREMITY N/A 07/21/2022   Procedure: ABDOMINAL AORTOGRAM W/LOWER EXTREMITY;  Surgeon: Nada Libman, MD;  Location: [facility];  Service: Cardiovascular;  Laterality: N/A;   ABDOMINAL HYSTERECTOMY     ACHILLES TENDON LENGTHENING  08/15/2022   Procedure: ACHILLES TENDON LENGTHENING;  Surgeon: [name], DPM;  Location: [facility] OR;  Service: Podiatry;;   AMPUTATION Right 05/29/2022   Procedure: RIGHT BELOW THE KNEE AMPUTATION;  Surgeon: [name], MD;  Location: [facility] OR;  Service: Orthopedics;  Laterality: Right;   AMPUTATION Left 09/04/2022   Procedure: AMPUTATION FOOT, serial irrigation;  Surgeon: Louann Sjogren, DPM;  Location: [facility] OR;  Service: Podiatry;  Laterality: Left;  Surgical team to do block   AMPUTATION Left 10/07/2022   Procedure: LEFT BELOW KNEE AMPUTATION;  Surgeon: [name], MD;  Location: [facility] OR;  Service: Orthopedics;  Laterality: Left;  AV FISTULA PLACEMENT Left 09/02/2017   Procedure:  creation of left arm ARTERIOVENOUS (AV) FISTULA;  Surgeon: Nada Libman, MD;  Location: [facility] OR;  Service: Vascular;  Laterality: Left;   COLONOSCOPY  2008   Dr. Darrick Penna: normal    COLONOSCOPY N/A 12/18/2016   Dr. Darrick Penna: multiple tubular adenomas, internal hemorrhoids. Surveillance in 3 years    ESOPHAGEAL DILATION N/A 10/13/2015   Procedure: ESOPHAGEAL DILATION;  Surgeon: Malissa Hippo, MD;  Location: [facility];  Service: Endoscopy;  Laterality: N/A;   ESOPHAGOGASTRODUODENOSCOPY N/A 10/13/2015   Dr. Karilyn Cota: chronic gastritis on path, no H.pylori. Empiric dilation    ESOPHAGOGASTRODUODENOSCOPY N/A 12/18/2016   Dr. Darrick Penna: mild gastritis. BRAVO study revealed uncontrolled GERD. Dysphagia secondary to uncontrolled reflux   FOOT SURGERY Bilateral    "nerve"     LEFT HEART CATH AND CORONARY ANGIOGRAPHY N/A 12/29/2018   Procedure: LEFT HEART CATH AND CORONARY ANGIOGRAPHY;  Surgeon: Corky Crafts, MD;  Location: [facility] INVASIVE CV LAB;  Service: Cardiovascular;  Laterality: N/A;   LOWER EXTREMITY ANGIOGRAPHY Right 05/04/2022   Procedure: Lower Extremity Angiography;  Surgeon: Victorino Sparrow, MD;  Location: [facility] INVASIVE CV LAB;  Service: Cardiovascular;  Laterality: Right;   LUNG BIOPSY     MASS EXCISION Right 01/09/2013   Procedure: EXCISION OF NEOPLASM OF RIGHT  AXILLA  AND EXCISION OF NEOPLASM OF LEFT AXILLA;  Surgeon: Dalia Heading, MD;  Location: [facility] ORS;  Service: General;  Laterality: Right;  procedure end @ 08:23   MYRINGOTOMY WITH TUBE PLACEMENT Bilateral 04/28/2017   Procedure: BILATERAL MYRINGOTOMY WITH TUBE PLACEMENT;  Surgeon: Newman Pies, MD;  Location: [facility] OR;  Service: ENT;  Laterality: Bilateral;   PERIPHERAL VASCULAR BALLOON ANGIOPLASTY Right 05/04/2022   Procedure: PERIPHERAL VASCULAR BALLOON ANGIOPLASTY;  Surgeon: Victorino Sparrow, MD;  Location: [facility]VASIVE CV LAB;  Service: Cardiovascular;  Laterality: Right;  PT   PERIPHERAL VASCULAR INTERVENTION Right 05/04/2022   Procedure:  PERIPHERAL VASCULAR INTERVENTION;  Surgeon: Victorino Sparrow, MD;  Location: [facility] INVASIVE CV LAB;  Service: Cardiovascular;  Laterality: Right;  SFA   PERIPHERAL VASCULAR INTERVENTION Left 07/21/2022   Procedure: PERIPHERAL VASCULAR INTERVENTION;  Surgeon: Nada Libman, MD;  Location: [facility];  Service: Cardiovascular;  Laterality: Left;   REVISION OF ARTERIOVENOUS GORETEX GRAFT Left 05/04/2018   Procedure: TRANSPOSITION OF CEPHALIC VEIN ARTERIOVENOUS FISTULA LEFT ARM;  Surgeon: Larina Earthly, MD;  Location: [facility] OR;  Service: Vascular;  Laterality: Left;   SAVORY DILATION N/A 12/18/2016   Procedure: SAVORY DILATION;  Surgeon: West Bali, MD;  [facility]: [facility];  Service: Endoscopy;  Laterality: N/A;   TRANSMETATARSAL AMPUTATION Left 08/15/2022   Procedure: TRANSMETATARSAL AMPUTATION;  Surgeon: [name], DPM;  Location: [facility] OR;  Service: Podiatry;  Laterality: Left;   Social History   Occupational History   Occupation: retired   Tobacco Use   Smoking status: Never    Passive exposure: Never   Smokeless tobacco: Never   Tobacco comments:    Verified by Daughter, [name]  Vaping Use   Vaping status: Never Used  Substance and Sexual Activity   Alcohol use: No   Drug use: No   Sexual activity: Not Currently    Partners: Male

## 2023-03-03 DIAGNOSIS — Z992 Dependence on renal dialysis: Secondary | ICD-10-CM | POA: Diagnosis not present

## 2023-03-03 DIAGNOSIS — N186 End stage renal disease: Secondary | ICD-10-CM | POA: Diagnosis not present

## 2023-03-04 DIAGNOSIS — D631 Anemia in chronic kidney disease: Secondary | ICD-10-CM | POA: Diagnosis not present

## 2023-03-04 DIAGNOSIS — Z89512 Acquired absence of left leg below knee: Secondary | ICD-10-CM | POA: Diagnosis not present

## 2023-03-04 DIAGNOSIS — M62522 Muscle wasting and atrophy, not elsewhere classified, left upper arm: Secondary | ICD-10-CM | POA: Diagnosis not present

## 2023-03-04 DIAGNOSIS — N186 End stage renal disease: Secondary | ICD-10-CM | POA: Diagnosis not present

## 2023-03-04 DIAGNOSIS — E1151 Type 2 diabetes mellitus with diabetic peripheral angiopathy without gangrene: Secondary | ICD-10-CM | POA: Diagnosis not present

## 2023-03-04 DIAGNOSIS — I110 Hypertensive heart disease with heart failure: Secondary | ICD-10-CM | POA: Diagnosis not present

## 2023-03-04 DIAGNOSIS — T8781 Dehiscence of amputation stump: Secondary | ICD-10-CM | POA: Diagnosis not present

## 2023-03-04 DIAGNOSIS — E1122 Type 2 diabetes mellitus with diabetic chronic kidney disease: Secondary | ICD-10-CM | POA: Diagnosis not present

## 2023-03-04 DIAGNOSIS — I5032 Chronic diastolic (congestive) heart failure: Secondary | ICD-10-CM | POA: Diagnosis not present

## 2023-03-05 DIAGNOSIS — Z992 Dependence on renal dialysis: Secondary | ICD-10-CM | POA: Diagnosis not present

## 2023-03-05 DIAGNOSIS — N186 End stage renal disease: Secondary | ICD-10-CM | POA: Diagnosis not present

## 2023-03-08 ENCOUNTER — Ambulatory Visit: Payer: Self-pay | Admitting: *Deleted

## 2023-03-08 DIAGNOSIS — Z992 Dependence on renal dialysis: Secondary | ICD-10-CM | POA: Diagnosis not present

## 2023-03-08 DIAGNOSIS — N186 End stage renal disease: Secondary | ICD-10-CM | POA: Diagnosis not present

## 2023-03-08 NOTE — Patient Instructions (Signed)
Visit Information  Thank you for taking time to visit with me today. Please don't hesitate to contact me if I can be of assistance to you.   Following are the goals we discussed today:   Goals Addressed             This Visit's Progress    Receive Counseling & Supportive Services to Help Cope with Chronic Illnesses.   On track    Care Coordination Interventions:  Interventions Today    Flowsheet Row Most Recent Value  Chronic Disease   Chronic disease during today's visit Diabetes, Congestive Heart Failure (CHF), Hypertension (HTN), Chronic Kidney Disease/End Stage Renal Disease (ESRD), Other  [Neuropathy, Chronic Pain Syndrome, Anxiety, Depression, Memory Loss, Neurocognitive Disorder & Requires Maximum Assistance with Activities of Daily Living, Frequent Falls.]  General Interventions   General Interventions Discussed/Reviewed General Interventions Discussed, General Interventions Reviewed, Durable Medical Equipment (DME), Doctor Visits, Communication with, Level of Care, [employer], [employer] Exam, Labs, Programme researcher, broadcasting/film/video Exam, Health Screening, Vaccines  [Encouraged Routine Engagement with Care Team Members.]  Labs Hgb A1c every 3 months, Hgb A1c annually, Kidney Function  [Encouraged Routine Labs.]  Vaccines COVID-19, Flu, Pneumonia, RSV, Shingles, Tetanus/Pertussis/Diphtheria  [Encouraged Annual Vaccinations.]  Doctor Visits Discussed/Reviewed Doctor Visits Discussed, Doctor Visits Reviewed, Annual Wellness Visits, PCP, Specialist  [Encouraged Routine Engagement with Care Team Members.]  Health Screening Bone Density, Colonoscopy, Mammogram  [Encouraged Annual Health Screenings.]  Durable Medical Equipment (DME) Bed side commode, BP Cuff, Shower bench, Academic librarian, Environmental consultant, Biomedical scientist, Other  [Prescription Eyeglasses, Medical laboratory scientific officer, Sports administrator, Raised Engineer, civil (consulting) with Lid, Manufacturing systems engineer, Fish farm manager, Etc.]  Wheelchair Standard  PCP/Specialist Visits Compliance with follow-up visit   [Encouraged Routine Engagement with Care Team Members.]  Communication with PCP/Specialists, Charity fundraiser, Pharmacists, Social Work  [employer] Routine Engagement with Care Team Members.]  Level of Care Adult Daycare, Air traffic controller, Assisted Living, Skilled Nursing Facility  [Confirmed Disinterest in Enrollment in Adult Day Care Program or Receiving Assistance Pursuing Higher Level of Care Placement Options (I.e Assisted Living Versus Skilled Nursing Facility).]  Applications Medicaid, Personal Care Services  [Confirmed Disinterest in Receiving Assistance Applying for Medicaid, Personal Care Services or Aid & Attendance Benefits.]  Exercise Interventions   Exercise Discussed/Reviewed Exercise Discussed, Exercise Reviewed, Physical Activity, Weight Managment, Assistive device use and maintanence  [Encouraged Increased Level of Activity & Exercise, as Tolerated.]  Physical Activity Discussed/Reviewed Physical Activity Discussed, Gym, Physical Activity Reviewed, Types of exercise, Home Exercise Program (HEP), PREP  [Encouraged Daily Exercise Regimen, as Tolerated.]  Weight Management Weight loss  [Encouraged.]  Education Interventions   Education Provided Provided Education  [Encouraged Review & Consideration.]  Provided Verbal Education On Nutrition, Exercise, Foot Care, Medication, When to see the doctor, Eye Care, Blood Sugar Monitoring, [employer], Mental Health/Coping with Illness, Applications, Insurance Plans  [Encouraged Consideration & Implementation.]  Ship broker, Personal Care Services  [Confirmed Disinterest in Receiving Assistance Applying for Medicaid, Personal Care Services or Aid & Attendance Benefits.]  Mental Health Interventions   Mental Health Discussed/Reviewed Mental Health Discussed, Substance Abuse, Suicide, Other, Mental Health Reviewed, Coping Strategies, Crisis, Anxiety, Depression, Grief and Loss  [Assessed Mental Health & Cognitive Status.]  Nutrition Interventions    Nutrition Discussed/Reviewed Nutrition Discussed, Portion sizes, Decreasing sugar intake, Nutrition Reviewed, Increasing proteins, Carbohydrate meal planning, Decreasing fats, Adding fruits and vegetables, Decreasing salt, Fluid intake  [Encouraged Heart-Healthy, Diabetic-Friendly, Low Fat, Low Sodium, Reduced Sugar Diet.]  Pharmacy Interventions   Pharmacy Dicussed/Reviewed Pharmacy Topics Discussed, Affording Medications, Pharmacy Topics Reviewed,  Medications and their functions, Medication Adherence  [Confirmed Ability to [employer] Prescription Medications.]  Medication Adherence --  [Confirmed Prescription Medication Compliance.]  Safety Interventions   Safety Discussed/Reviewed Safety Discussed, Safety Reviewed, Fall Risk, Home Safety  [Encouraged Consideration of Home Safety Evaluation.]  Home Safety Assistive Devices, Need for home safety assessment  [Encouraged Routine Use of Assistive Devices.]  Advanced Directive Interventions   Advanced Directives Discussed/Reviewed Advanced Directives Discussed, Advanced Directives Reviewed  [Confirmed Initiation of Advanced Directives (Living Will & Healthcare Power of Attorney Documents), Requesting Copies to Scan into Electronic Medical Record in Epic.]       Active Listening & Reflection Utilized.  Verbalization of Feelings Encouraged. Emotional Support Provided. Cognitive Behavioral Therapy Initiated.  Acceptance & Commitment Therapy Performed. Client-Centered Therapy Indicated. CSW Collaboration with Daughter, [name] to [facility] Sk[facility] Nursing & Physical Therapy Services in [place], through [facility] [phone]). CSW Collaboration with Daughter, [name] to Encourage Routine Engagement with Danford Bad, Licensed Clinical Social Worker with [facility] [phone]), if She Has Questions, Needs Assistance, or If Additional Social Work Needs Are Identified Between Now & Our Next  Follow-Up Outreach Call, Scheduled on 04/05/2023 at 9:00 AM.      Our next appointment is by telephone on  04/05/2023 at 9:00 am.  Please call the care guide team at [phone] if you need to cancel or reschedule your appointment.   If you are experiencing a Mental Health or Behavioral Health Crisis or need someone to talk to, please call the Suicide and Crisis Lifeline: 988 call the [place] National Suicide Prevention Lifeline: 407-753-1064 or TTY: (580)823-2436 TTY (401)720-7760) to talk to a trained counselor call 1-800-273-TALK (toll free, 24 hour hotline) go to [facility] [address], [place] [phone]) call the [facility] Crisis Line: 802-572-9015 call 911  Patient verbalizes understanding of instructions and care plan provided today and agrees to view in MyChart. Active MyChart status and patient understanding of how to access instructions and care plan via MyChart confirmed with patient.     Telephone follow up appointment with care management team member scheduled for:   04/05/2023 at 9:00 am.  Danford Bad, BSW, MSW, LCSW  Embedded [employer] Social Work Case Manager  [facility], Population Health Direct Dial: [phone]  Fax: [phone] Email: [email or web address]: [email or web address]

## 2023-03-08 NOTE — Patient Outreach (Signed)
Care Coordination   Follow Up Visit Note   03/08/2023  Name: Kathryn Beck MRN: [id number] DOB: [birth year]  Kathryn Beck is a 67 y.o. year old female who sees Kathryn Perches, MD for primary care. I spoke with patient's daughter, Kathryn Beck by phone today.  What matters to the patients health and wellness today?  Receive Counseling & Supportive Services to Help Cope with Chronic Illnesses.    Goals Addressed             This Visit's Progress    Receive Counseling & Supportive Services to Help Cope with Chronic Illnesses.   On track    Care Coordination Interventions:  Interventions Today    Flowsheet Row Most Recent Value  Chronic Disease   Chronic disease during today's visit Diabetes, Congestive Heart Failure (CHF), Hypertension (HTN), Chronic Kidney Disease/End Stage Renal Disease (ESRD), Other  [Neuropathy, Chronic Pain Syndrome, Anxiety, Depression, Memory Loss, Neurocognitive Disorder & Requires Maximum Assistance with Activities of Daily Living, Frequent Falls.]  General Interventions   General Interventions Discussed/Reviewed General Interventions Discussed, General Interventions Reviewed, Durable Medical Equipment (DME), Doctor Visits, Communication with, Level of Care, [facility], [employer] Exam, Labs, Programme researcher, broadcasting/film/video Exam, Health Screening, Vaccines  [Encouraged Routine Engagement with Care Team Members.]  Labs Hgb A1c every 3 months, Hgb A1c annually, Kidney Function  [Encouraged Routine Labs.]  Vaccines COVID-19, Flu, Pneumonia, RSV, Shingles, Tetanus/Pertussis/Diphtheria  [Encouraged Annual Vaccinations.]  Doctor Visits Discussed/Reviewed Doctor Visits Discussed, Doctor Visits Reviewed, Annual Wellness Visits, PCP, Specialist  [Encouraged Routine Engagement with Care Team Members.]  Health Screening Bone Density, Colonoscopy, Mammogram  [Encouraged Annual Health Screenings.]  Durable Medical Equipment (DME) Bed side commode, BP Cuff, Shower bench,  Academic librarian, Environmental consultant, Biomedical scientist, Other  [Prescription Eyeglasses, Medical laboratory scientific officer, Sports administrator, Raised Engineer, civil (consulting) with Lid, Manufacturing systems engineer, Fish farm manager, Etc.]  Wheelchair Standard  PCP/Specialist Visits Compliance with follow-up visit  [Encouraged Routine Engagement with Care Team Members.]  Communication with PCP/Specialists, Charity fundraiser, Pharmacists, Social Work  [employer] Routine Engagement with Care Team Members.]  Level of Care Adult Daycare, Air traffic controller, Assisted Living, Skilled Nursing Facility  [Confirmed Disinterest in Enrollment in Adult Day Care Program or Receiving Assistance Pursuing Higher Level of Care Placement Options (I.e Assisted Living Versus Skilled Nursing Facility).]  Applications Medicaid, Personal Care Services  [Confirmed Disinterest in Receiving Assistance Applying for Medicaid, Personal Care Services or Aid & Attendance Benefits.]  Exercise Interventions   Exercise Discussed/Reviewed Exercise Discussed, Exercise Reviewed, Physical Activity, Weight Managment, Assistive device use and maintanence  [Encouraged Increased Level of Activity & Exercise, as Tolerated.]  Physical Activity Discussed/Reviewed Physical Activity Discussed, Gym, Physical Activity Reviewed, Types of exercise, Home Exercise Program (HEP), PREP  [Encouraged Daily Exercise Regimen, as Tolerated.]  Weight Management Weight loss  [Encouraged.]  Education Interventions   Education Provided Provided Education  [Encouraged Review & Consideration.]  Provided Verbal Education On Nutrition, Exercise, Foot Care, Medication, When to see the doctor, Eye Care, Blood Sugar Monitoring, [facility], Mental Health/Coping with Illness, Applications, Insurance Plans  [Encouraged Consideration & Implementation.]  Ship broker, Personal Care Services  [Confirmed Disinterest in Receiving Assistance Applying for Medicaid, Personal Care Services or Aid & Attendance Benefits.]  Mental Health Interventions   Mental Health  Discussed/Reviewed Mental Health Discussed, Substance Abuse, Suicide, Other, Mental Health Reviewed, Coping Strategies, Crisis, Anxiety, Depression, Grief and Loss  [Assessed Mental Health & Cognitive Status.]  Nutrition Interventions   Nutrition Discussed/Reviewed Nutrition Discussed, Portion sizes, Decreasing sugar intake, Nutrition Reviewed, Increasing proteins, Carbohydrate meal  planning, Decreasing fats, Adding fruits and vegetables, Decreasing salt, Fluid intake  [Encouraged Heart-Healthy, Diabetic-Friendly, Low Fat, Low Sodium, Reduced Sugar Diet.]  Pharmacy Interventions   Pharmacy Dicussed/Reviewed Pharmacy Topics Discussed, Affording Medications, Pharmacy Topics Reviewed, Medications and their functions, Medication Adherence  [Confirmed Ability to Afford Prescription Medications.]  Medication Adherence --  [Confirmed Prescription Medication Compliance.]  Safety Interventions   Safety Discussed/Reviewed Safety Discussed, Safety Reviewed, Fall Risk, Home Safety  [Encouraged Consideration of Home Safety Evaluation.]  Home Safety Assistive Devices, Need for home safety assessment  [Encouraged Routine Use of Assistive Devices.]  Advanced Directive Interventions   Advanced Directives Discussed/Reviewed Advanced Directives Discussed, Advanced Directives Reviewed  [Confirmed Initiation of Advanced Directives (Living Will & Healthcare Power of Attorney Documents), Requesting Copies to Scan into Electronic Medical Record in Epic.]       Active Listening & Reflection Utilized.  Verbalization of Feelings Encouraged. Emotional Support Provided. Cognitive Behavioral Therapy Initiated.  Acceptance & Commitment Therapy Performed. Client-Centered Therapy Indicated. CSW Collaboration with Daughter, Kathryn Beck to [facility] Physical Therapy Services in [place], through [facility] [phone]). CSW Collaboration with Daughter, Kathryn Beck to Encourage Routine  Engagement with Kathryn Beck, Licensed Clinical Social Worker with [facility] [phone]), if She Has Questions, Needs Assistance, or If Additional Social Work Needs Are Identified Between Now & Our Next Follow-Up Outreach Call, Scheduled on 04/05/2023 at 9:00 AM.      SDOH assessments and interventions completed:  Yes.  Care Coordination Interventions:  Yes, provided.   Follow up plan: Follow up call scheduled for 04/05/2023 at 9:00 am.  Encounter Outcome:  Patient Visit Completed.   Kathryn Beck, BSW, MSW, Printmaker Social Work Case Set designer Health  [facility], Population Health Direct Dial: [phone]  Fax: [phone] Email: [email or web address]: [email or web address]

## 2023-03-09 DIAGNOSIS — Z89512 Acquired absence of left leg below knee: Secondary | ICD-10-CM | POA: Diagnosis not present

## 2023-03-09 DIAGNOSIS — E1122 Type 2 diabetes mellitus with diabetic chronic kidney disease: Secondary | ICD-10-CM | POA: Diagnosis not present

## 2023-03-09 DIAGNOSIS — E1151 Type 2 diabetes mellitus with diabetic peripheral angiopathy without gangrene: Secondary | ICD-10-CM | POA: Diagnosis not present

## 2023-03-09 DIAGNOSIS — T8781 Dehiscence of amputation stump: Secondary | ICD-10-CM | POA: Diagnosis not present

## 2023-03-09 DIAGNOSIS — I110 Hypertensive heart disease with heart failure: Secondary | ICD-10-CM | POA: Diagnosis not present

## 2023-03-09 DIAGNOSIS — N186 End stage renal disease: Secondary | ICD-10-CM | POA: Diagnosis not present

## 2023-03-09 DIAGNOSIS — M62522 Muscle wasting and atrophy, not elsewhere classified, left upper arm: Secondary | ICD-10-CM | POA: Diagnosis not present

## 2023-03-09 DIAGNOSIS — I5032 Chronic diastolic (congestive) heart failure: Secondary | ICD-10-CM | POA: Diagnosis not present

## 2023-03-09 DIAGNOSIS — D631 Anemia in chronic kidney disease: Secondary | ICD-10-CM | POA: Diagnosis not present

## 2023-03-10 DIAGNOSIS — Z992 Dependence on renal dialysis: Secondary | ICD-10-CM | POA: Diagnosis not present

## 2023-03-10 DIAGNOSIS — N186 End stage renal disease: Secondary | ICD-10-CM | POA: Diagnosis not present

## 2023-03-11 DIAGNOSIS — I110 Hypertensive heart disease with heart failure: Secondary | ICD-10-CM | POA: Diagnosis not present

## 2023-03-11 DIAGNOSIS — Z89512 Acquired absence of left leg below knee: Secondary | ICD-10-CM | POA: Diagnosis not present

## 2023-03-11 DIAGNOSIS — D631 Anemia in chronic kidney disease: Secondary | ICD-10-CM | POA: Diagnosis not present

## 2023-03-11 DIAGNOSIS — E1151 Type 2 diabetes mellitus with diabetic peripheral angiopathy without gangrene: Secondary | ICD-10-CM | POA: Diagnosis not present

## 2023-03-11 DIAGNOSIS — E1122 Type 2 diabetes mellitus with diabetic chronic kidney disease: Secondary | ICD-10-CM | POA: Diagnosis not present

## 2023-03-11 DIAGNOSIS — N186 End stage renal disease: Secondary | ICD-10-CM | POA: Diagnosis not present

## 2023-03-11 DIAGNOSIS — M62522 Muscle wasting and atrophy, not elsewhere classified, left upper arm: Secondary | ICD-10-CM | POA: Diagnosis not present

## 2023-03-11 DIAGNOSIS — I5032 Chronic diastolic (congestive) heart failure: Secondary | ICD-10-CM | POA: Diagnosis not present

## 2023-03-11 DIAGNOSIS — T8781 Dehiscence of amputation stump: Secondary | ICD-10-CM | POA: Diagnosis not present

## 2023-03-12 DIAGNOSIS — N186 End stage renal disease: Secondary | ICD-10-CM | POA: Diagnosis not present

## 2023-03-12 DIAGNOSIS — Z992 Dependence on renal dialysis: Secondary | ICD-10-CM | POA: Diagnosis not present

## 2023-03-15 DIAGNOSIS — N186 End stage renal disease: Secondary | ICD-10-CM | POA: Diagnosis not present

## 2023-03-15 DIAGNOSIS — Z992 Dependence on renal dialysis: Secondary | ICD-10-CM | POA: Diagnosis not present

## 2023-03-18 ENCOUNTER — Encounter (HOSPITAL_BASED_OUTPATIENT_CLINIC_OR_DEPARTMENT_OTHER): Payer: Medicare PPO | Admitting: Family

## 2023-03-18 DIAGNOSIS — Z992 Dependence on renal dialysis: Secondary | ICD-10-CM | POA: Diagnosis not present

## 2023-03-18 DIAGNOSIS — N186 End stage renal disease: Secondary | ICD-10-CM | POA: Diagnosis not present

## 2023-03-18 NOTE — Progress Notes (Deleted)
[facility]pertension [facility] Assessment:    Date:  03/18/2023   ID:  Kathryn Beck, DOB 1955/04/10, MRN [id number]  PCP:  [name], MD  Cardiologist:  Nona Dell, MD  Nephrologist:  Referring MD: [name], MD   CC: Hypertension  History of Present Illness:    Kathryn Beck is a 67 y.o. female with a hx of hypertension, ESRD on HD, DM2, AS, chronic diastolic heart failure, sleep apnea, PAD s/p R BKA 05/29/22, CVA, HLD with statin intolerance  here to follow up in the [facility]. She follows with Dr. Diona Browner.   Prior cardiac catheterization October 2020 with normal coronary arteries. Echo 01/17/19 normal LVEF 60-65%, moderate LVH, impaired diastolic function, RV normal, mild MR.  Referred by her PCP, Dr. Lodema Hong, for uncontrolled hypertension as well as episodes of hypotension during HD. Established with [facility] 04/16/22. Kathryn Beck was diagnosed with hypertension in her 30s. At initial visit noted to be taking Clonidine daily, increased to BID. She was recommended for carotid duplex due to prior stenosis by CT 2019 and echocardiogram to assess LVH. She was referred to PREP exercise program.   She was seen 05/11/22 by PCP team with BP 105/67 after admission 04/2022 with normal or low normal  BP recommended to stop Amlodipine, Clonidine. At visit with PCP, recommended to stop Amlodipine, Clonidine, Imdur. Recommended to discuss stopping Lasix with nephrology team.  Multiple admissions since that time. 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 10/2022 acute respiratory failure with hypoxia. 11/2022 admitted after fall with syncope workup revealing stable echo/carotids. No acute CVA and MRI with chronic findings. Of note BP control during all of these admissions was suboptimal.   Presents today for follow up.   Note from 05/12/22  with discontinuation of Amlodipine, Clonidine, Imdur due to BP 105/67.   ***INCREASE AMLO ?why torsemide - make urine? ?where did clonidine go? ?where did imdur go? Change metoprolol to Nebivolol  She waits to take all of her medications until after dialysis. Often at HD, initial BP 222/100s then after she starts running the machine it goes down to 170s-180s. Also with episodes of hypotension during HD. Last episode 2 weeks ago.   She notes exertional dyspnea which has been ongoing. She has had some orthopnea over the last couple of nights. She attributes this to not wearing her CPPA. She just completed repeat home sleep study. She notes lower extremity edema which is intermittent. She does restrict to 32 oz fluid intake per day per nephrology.   Her pills are organized via pill pack. However, some medication sin pill pack and some still in the bottles. She is taking Imdur, Clonidine, Metoprolol, Amlodipine in the morning after HD. She thinks she has been taking her Clonidine just once per day. ***  Previous antihypertensives: ACE - anaphylaxis, swelling   Past Medical History:  Diagnosis Date   Acid reflux    Amputated left leg (HCC)    Anemia    Arthritis    Axillary masses    Soft tissue - status post excision   Back pain    Cancer (HCC)    CHF (congestive heart failure) (HCC)    COVID-19 virus infection 04/06/2019   Depression    End-stage renal disease (HCC)    M/W/F dialysis   Essential hypertension    Headache    years ago  History of blood transfusion    History of cardiac catheterization    Normal coronary arteries October 2020   History of claustrophobia    History of pneumonia 2019   Hypoxia 04/03/2019   Memory loss    Mixed hyperlipidemia    Obesity    Pancreatitis    Peritoneal dialysis catheter in place [facility])    Pneumonia due to COVID-19 virus 04/02/2019   Sleep apnea    Noncompliant with CPAP   Stroke (HCC)    mini stroke   Type 2 diabetes mellitus  (HCC)     Past Surgical History:  Procedure Laterality Date   ABDOMINAL AORTOGRAM W/LOWER EXTREMITY N/A 04/30/2022   Procedure: ABDOMINAL AORTOGRAM W/LOWER EXTREMITY;  Surgeon: Leonie Douglas, MD;  [facility]: [facility] INV[facility];  Service: Cardiovascular;  Laterality: N/A;   ABDOMINAL AORTOGRAM W/LOWER EXTREMITY N/A 07/21/2022   Procedure: ABDOMINAL AORTOGRAM W/LOWER EXTREMITY;  Surgeon: Nada Libman, MD;  Location: [facility];  Service: Cardiovascular;  Laterality: N/A;   ABDOMINAL HYSTERECTOMY     ACHILLES TENDON LENGTHENING  08/15/2022   Procedure: ACHILLES TENDON LENGTHENING;  Surgeon: [name], DPM;  Location: [facility] OR;  Service: Podiatry;;   AMPUTATION Right 05/29/2022   Procedure: RIGHT BELOW THE KNEE AMPUTATION;  Surgeon: [name], MD;  Location: [facility] OR;  Service: Orthopedics;  Laterality: Right;   AMPUTATION Left 09/04/2022   Procedure: AMPUTATION FOOT, serial irrigation;  Surgeon: Louann Sjogren, DPM;  Location: [facility] OR;  Service: Podiatry;  Laterality: Left;  Surgical team to do block   AMPUTATION Left 10/07/2022   Procedure: LEFT BELOW KNEE AMPUTATION;  Surgeon: [name], MD;  Location: [facility] OR;  Service: Orthopedics;  Laterality: Left;   AV FISTULA PLACEMENT Left 09/02/2017   Procedure: creation of left arm ARTERIOVENOUS (AV) FISTULA;  Surgeon: Nada Libman, MD;  Location: [facility] OR;  Service: Vascular;  Laterality: Left;   COLONOSCOPY  2008   Dr. Darrick Penna: normal    COLONOSCOPY N/A 12/18/2016   Dr. Darrick Penna: multiple tubular adenomas, internal hemorrhoids. Surveillance in 3 years    ESOPHAGEAL DILATION N/A 10/13/2015   Procedure: ESOPHAGEAL DILATION;  Surgeon: Malissa Hippo, MD;  [facility];  Service: Endoscopy;  Laterality: N/A;   ESOPHAGOGASTRODUODENOSCOPY N/A 10/13/2015   Dr. Karilyn Cota: chronic gastritis on path, no H.pylori. Empiric dilation    ESOPHAGOGASTRODUODENOSCOPY N/A 12/18/2016   Dr. Darrick Penna: mild gastritis. BRAVO study revealed uncontrolled  GERD. Dysphagia secondary to uncontrolled reflux   FOOT SURGERY Bilateral    "nerve"     LEFT HEART CATH AND CORONARY ANGIOGRAPHY N/A 12/29/2018   Procedure: LEFT HEART CATH AND CORONARY ANGIOGRAPHY;  Surgeon: Corky Crafts, MD;  Location: [facility] INVASIVE CV LAB;  Service: Cardiovascular;  Laterality: N/A;   LOWER EXTREMITY ANGIOGRAPHY Right 05/04/2022   Procedure: Lower Extremity Angiography;  Surgeon: Victorino Sparrow, MD;  Location: [facility] INVASIVE CV LAB;  Service: Cardiovascular;  Laterality: Right;   LUNG BIOPSY     MASS EXCISION Right 01/09/2013   Procedure: EXCISION OF NEOPLASM OF RIGHT  AXILLA  AND EXCISION OF NEOPLASM OF LEFT AXILLA;  Surgeon: Dalia Heading, MD;  Location: [facility] ORS;  Service: General;  Laterality: Right;  procedure end @ 08:23   MYRINGOTOMY WITH TUBE PLACEMENT Bilateral 04/28/2017   Procedure: BILATERAL MYRINGOTOMY WITH TUBE PLACEMENT;  Surgeon: Newman Pies, MD;  Location: [facility] OR;  Service: ENT;  Laterality: Bilateral;   PERIPHERAL VASCULAR BALLOON ANGIOPLASTY Right 05/04/2022   Procedure: PERIPHERAL  VASCULAR BALLOON ANGIOPLASTY;  Surgeon: Victorino Sparrow, MD;  Location: [facility] INVASIVE CV LAB;  Service: Cardiovascular;  Laterality: Right;  PT   PERIPHERAL VASCULAR INTERVENTION Right 05/04/2022   Procedure: PERIPHERAL VASCULAR INTERVENTION;  Surgeon: Victorino Sparrow, MD;  Location: [facility] INVASIVE CV LAB;  Service: Cardiovascular;  Laterality: Right;  SFA   PERIPHERAL VASCULAR INTERVENTION Left 07/21/2022   Procedure: PERIPHERAL VASCULAR INTERVENTION;  Surgeon: Nada Libman, MD;  [facility];  Service: Cardiovascular;  Laterality: Left;   REVISION OF ARTERIOVENOUS GORETEX GRAFT Left 05/04/2018   Procedure: TRANSPOSITION OF CEPHALIC VEIN ARTERIOVENOUS FISTULA LEFT ARM;  Surgeon: Larina Earthly, MD;  Location: [facility] OR;  Service: Vascular;  Laterality: Left;   SAVORY DILATION N/A 12/18/2016   Procedure: SAVORY DILATION;  Surgeon: West Bali, MD;  [facility];  Service: Endoscopy;  Laterality: N/A;   TRANSMETATARSAL AMPUTATION Left 08/15/2022   Procedure: TRANSMETATARSAL AMPUTATION;  Surgeon: [name], DPM;  Location: [facility] OR;  Service: Podiatry;  Laterality: Left;    Current Medications: No outpatient medications have been marked as taking for the 03/18/23 encounter (Appointment) with Alver Sorrow, NP.     Allergies:   Ace inhibitors, Penicillins, Statins, and Albuterol   Social History   Socioeconomic History   Marital status: Married    Spouse name: Nicollette Dugard   Number of children: 2   Years of education: 12   Highest education level: 12th grade  Occupational History   Occupation: retired   Tobacco Use   Smoking status: Never    Passive exposure: Never   Smokeless tobacco: Never   Tobacco comments:    Verified by Daughter, [name]  Vaping Use   Vaping status: Never Used  Substance and Sexual Activity   Alcohol use: No   Drug use: No   Sexual activity: Not Currently    Partners: Male  Other Topics Concern   Not on file  Social History Narrative   Lives alone with husband    Right handed   Caffeine-1/2 daily   Social Drivers of Health   Financial Resource Strain: Low Risk  (02/08/2023)   Overall Financial Resource Strain (CARDIA)    Difficulty of Paying Living Expenses: Not hard at all  Food Insecurity: No Food Insecurity (02/08/2023)   Hunger Vital Sign    Worried About Running Out of Food in the Last Year: Never true    Ran Out of Food in the Last Year: Never true  Transportation Needs: No Transportation Needs (02/08/2023)   PRAPARE - Administrator, Civil Service (Medical): No    Lack of Transportation (Non-Medical): No  Physical Activity: Inactive (02/08/2023)   Exercise Vital Sign    Days of Exercise per Week: 0 days    Minutes of Exercise per Session: 0 min  Stress: No Stress Concern Present (02/08/2023)   [employer] of Occupational Health - Occupational Stress  Questionnaire    Feeling of Stress : Not at all  Social Connections: Moderately Integrated (02/08/2023)   Social Connection and Isolation Panel [NHANES]    Frequency of Communication with Friends and Family: More than three times a week    Frequency of Social Gatherings with Friends and Family: More than three times a week    Attends Religious Services: More than 4 times per year    Active Member of [employer] or Organizations: No    Attends Banker Meetings: Never    Marital Status:  Married     Family History: The patient's family history includes Arthritis in her father; Breast cancer in her sister; Hypercholesterolemia in her father and sister; Hypertension in her father, sister, and sister. There is no history of Colon cancer or Colon polyps.  ROS:   Please see the history of present illness.     All other systems reviewed and are negative.  EKGs/Labs/Other Studies Reviewed:    EKG:  EKG is  ordered today.  The ekg ordered today demonstrates NSR 80 bpm with no acute ST/T wave changes.  Recent Labs: 12/28/2022: ALT 42; B Natriuretic Peptide 2,982.0; BUN 60; Creatinine, Ser 6.73; Hemoglobin 10.8; Magnesium 2.2; Platelets 262; Potassium 3.7; Sodium 142   Recent Lipid Panel    Component Value Date/Time   CHOL 123 12/18/2022 0413   TRIG 105 12/18/2022 0413   HDL 41 12/18/2022 0413   CHOLHDL 3.0 12/18/2022 0413   VLDL 21 12/18/2022 0413   LDLCALC 61 12/18/2022 0413   LDLCALC 109 (H) 11/12/2017 0936   LDLDIRECT 120.0 11/21/2020 1402    Physical Exam:   VS:  There were no vitals taken for this visit. , BMI There is no height or weight on file to calculate BMI. GENERAL:  Well appearing HEENT: Pupils equal round and reactive, fundi not visualized, oral mucosa unremarkable NECK:  No jugular venous distention, waveform within normal limits, carotid upstroke brisk and symmetric, no bruits, no thyromegaly LYMPHATICS:  No cervical adenopathy LUNGS:  Clear to auscultation  bilaterally HEART:  RRR.  PMI not displaced or sustained,S1 and S2 within normal limits, no S3, no S4, no clicks, no rubs, no murmurs ABD:  Flat, positive bowel sounds normal in frequency in pitch, no bruits, no rebound, no guarding, no midline pulsatile mass, no hepatomegaly, no splenomegaly EXT:  2 plus pulses throughout, no edema, no cyanosis no clubbing SKIN:  No rashes no nodules NEURO:  Cranial nerves II through XII grossly intact, motor grossly intact throughout PSYCH:  Cognitively intact, oriented to person place and time   ASSESSMENT/PLAN:    HTN - BP not at goal. Predominantly hypertensive but also with episodes of hypotension at HD. Last hypotensive spell 2 weeks ago. She thinks she is taking Clonidine QD - increase to BID. Continue Amlodipine 10mg  QD, Metoprolol 50mg  QD, Imdur 30mg  QD. Phone call in 1 week to check in. If BP not at goal, plan to increase Imdur. Future considerations include Hydralazine.  Carotid duplex 12/19/22 with minimal plaque and no significant stenosis   Update echocardiogram to assess LVH.  Cushings/Adrenal workup previously completed by Dr. Elvera Lennox of endocrinology detailed below.  Recently completed sleep study, await results.  Refer to PREP exercise program.  She will bring BP cuff to next OV so we can ensure accuracy.   HFpEF / AS - Volume management per HD. Echo 11/2022 LVEF 50-55%, gr2DD, elevated LVEDP, RVSF mildly reduced, trivial MR, severe calcification of AV with mild AS/AI.  PVD - s/p bilateral BKA. Follows with ortho and [facility]VS.  ESRD on HD - Follows with nephrology.   Dm2 - Follows with Dr. Lafe Garin of endocrinology.  HLD - Did not tolerate statin due to elevated LFTs. Continue Zetia 10mg  QD. Aortic atherosclerosis by CT, consider disussion of PCSK9i at follow up. Not addressed at this clinic visit.   Bilateral adrenal adenoma - Followed by Dr. Elvera Lennox of endocrinology. MRI 2019 bilateral adrenal adenoma. 2019-2022 plasma catecholamines,  metanephrines, aldosterone normal. 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 Prior workup  for autonomous cortisol production. Pituitary MRI positive pituitary mass. Per Dr. Lafe Garin hormone producing adenoma or Cushing's disease - awaits appt with [facility] to discuss inferior petrosal sinus sampling test. ***  Screening for Secondary Hypertension:     04/16/2022    8:23 PM  Causes  Sleep Apnea Screened  Thyroid Disease Screened  Pheochromocytoma Screened     - Comments per endocrinology  Cushing's Syndrome Screened     - Comments per endocrinology  Coarctation of the Aorta Screened     - Comments carotid duplex ordered 04/16/22  Compliance Screened     - Comments mix of pill pack and pill bottles makes compliance difficult to ascertain    Relevant Labs/Studies:    Latest Ref Rng & Units 12/28/2022    8:42 AM 12/17/2022    3:43 PM 11/19/2022    3:20 AM  Basic Labs  Sodium 135 - 145 mmol/L 142  138  141   Potassium 3.5 - 5.1 mmol/L 3.7  3.3  4.0   Creatinine 0.44 - 1.00 mg/dL 1.61  0.96  0.45        Latest Ref Rng & Units 10/28/2021   10:40 AM 12/24/2016   10:42 AM  Thyroid   TSH 0.35 - 5.50 uIU/mL 0.91  4.16        10/28/2021   10:40 AM 11/21/2020    2:02 PM 10/04/2018   12:07 PM 11/30/2017   10:53 AM  Renin/Aldosterone   Aldosterone CANCELED  1  2  3         Latest Ref Rng & Units 10/28/2021   10:40 AM 11/21/2020    2:02 PM 10/04/2018   12:07 PM 11/30/2017   10:53 AM  Metanephrines/Catecholamines   Epinephrine pg/mL 31  <20  24  see note   Norepinephrine pg/mL 736  332  167  99   Dopamine pg/mL <10  <10  <10  see note   Metanephrines <=57 pg/mL <25  <25  <25  <25   Normetanephrines  <=148 pg/mL 74  56  38  71        Latest Ref Rng & Units 07/22/2021    8:29 AM  Cortisol  Cortisol  ug/dL 40.9            Disposition:    FU with MD/PharmD in 3 weeks ***   Medication Adjustments/Labs and Tests Ordered: Current medicines are reviewed at length with  the patient today.  Concerns regarding medicines are outlined above.  No orders of the defined types were placed in this encounter.  No orders of the defined types were placed in this encounter.    Signed, Alver Sorrow, NP  03/18/2023 9:30 AM    [facility]

## 2023-03-19 DIAGNOSIS — N186 End stage renal disease: Secondary | ICD-10-CM | POA: Diagnosis not present

## 2023-03-19 DIAGNOSIS — Z992 Dependence on renal dialysis: Secondary | ICD-10-CM | POA: Diagnosis not present

## 2023-03-20 DIAGNOSIS — I96 Gangrene, not elsewhere classified: Secondary | ICD-10-CM | POA: Diagnosis not present

## 2023-03-20 DIAGNOSIS — S88912A Complete traumatic amputation of left lower leg, level unspecified, initial encounter: Secondary | ICD-10-CM | POA: Diagnosis not present

## 2023-03-20 DIAGNOSIS — Z89511 Acquired absence of right leg below knee: Secondary | ICD-10-CM | POA: Diagnosis not present

## 2023-03-22 DIAGNOSIS — N186 End stage renal disease: Secondary | ICD-10-CM | POA: Diagnosis not present

## 2023-03-22 DIAGNOSIS — Z992 Dependence on renal dialysis: Secondary | ICD-10-CM | POA: Diagnosis not present

## 2023-03-23 DIAGNOSIS — Z992 Dependence on renal dialysis: Secondary | ICD-10-CM | POA: Diagnosis not present

## 2023-03-23 DIAGNOSIS — N186 End stage renal disease: Secondary | ICD-10-CM | POA: Diagnosis not present

## 2023-03-25 ENCOUNTER — Ambulatory Visit: Payer: Medicare PPO | Admitting: Orthopedic Surgery

## 2023-03-26 DIAGNOSIS — N250 Renal osteodystrophy: Secondary | ICD-10-CM | POA: Diagnosis not present

## 2023-03-26 DIAGNOSIS — N2581 Secondary hyperparathyroidism of renal origin: Secondary | ICD-10-CM | POA: Diagnosis not present

## 2023-03-26 DIAGNOSIS — D509 Iron deficiency anemia, unspecified: Secondary | ICD-10-CM | POA: Diagnosis not present

## 2023-03-26 DIAGNOSIS — Z992 Dependence on renal dialysis: Secondary | ICD-10-CM | POA: Diagnosis not present

## 2023-03-26 DIAGNOSIS — D631 Anemia in chronic kidney disease: Secondary | ICD-10-CM | POA: Diagnosis not present

## 2023-03-26 DIAGNOSIS — N186 End stage renal disease: Secondary | ICD-10-CM | POA: Diagnosis not present

## 2023-03-29 DIAGNOSIS — D509 Iron deficiency anemia, unspecified: Secondary | ICD-10-CM | POA: Diagnosis not present

## 2023-03-29 DIAGNOSIS — D631 Anemia in chronic kidney disease: Secondary | ICD-10-CM | POA: Diagnosis not present

## 2023-03-29 DIAGNOSIS — N186 End stage renal disease: Secondary | ICD-10-CM | POA: Diagnosis not present

## 2023-03-29 DIAGNOSIS — N2581 Secondary hyperparathyroidism of renal origin: Secondary | ICD-10-CM | POA: Diagnosis not present

## 2023-03-29 DIAGNOSIS — N250 Renal osteodystrophy: Secondary | ICD-10-CM | POA: Diagnosis not present

## 2023-03-29 DIAGNOSIS — Z992 Dependence on renal dialysis: Secondary | ICD-10-CM | POA: Diagnosis not present

## 2023-03-31 DIAGNOSIS — N2581 Secondary hyperparathyroidism of renal origin: Secondary | ICD-10-CM | POA: Diagnosis not present

## 2023-03-31 DIAGNOSIS — D509 Iron deficiency anemia, unspecified: Secondary | ICD-10-CM | POA: Diagnosis not present

## 2023-03-31 DIAGNOSIS — N250 Renal osteodystrophy: Secondary | ICD-10-CM | POA: Diagnosis not present

## 2023-03-31 DIAGNOSIS — Z992 Dependence on renal dialysis: Secondary | ICD-10-CM | POA: Diagnosis not present

## 2023-03-31 DIAGNOSIS — N186 End stage renal disease: Secondary | ICD-10-CM | POA: Diagnosis not present

## 2023-03-31 DIAGNOSIS — D631 Anemia in chronic kidney disease: Secondary | ICD-10-CM | POA: Diagnosis not present

## 2023-04-01 ENCOUNTER — Ambulatory Visit: Payer: Medicare PPO | Admitting: Family Medicine

## 2023-04-01 ENCOUNTER — Encounter (HOSPITAL_COMMUNITY): Payer: Self-pay | Admitting: Hematology

## 2023-04-02 ENCOUNTER — Other Ambulatory Visit: Payer: Self-pay | Admitting: Family Medicine

## 2023-04-02 DIAGNOSIS — D509 Iron deficiency anemia, unspecified: Secondary | ICD-10-CM | POA: Diagnosis not present

## 2023-04-02 DIAGNOSIS — N2581 Secondary hyperparathyroidism of renal origin: Secondary | ICD-10-CM | POA: Diagnosis not present

## 2023-04-02 DIAGNOSIS — D631 Anemia in chronic kidney disease: Secondary | ICD-10-CM | POA: Diagnosis not present

## 2023-04-02 DIAGNOSIS — N186 End stage renal disease: Secondary | ICD-10-CM | POA: Diagnosis not present

## 2023-04-02 DIAGNOSIS — Z992 Dependence on renal dialysis: Secondary | ICD-10-CM | POA: Diagnosis not present

## 2023-04-02 DIAGNOSIS — N250 Renal osteodystrophy: Secondary | ICD-10-CM | POA: Diagnosis not present

## 2023-04-02 NOTE — Telephone Encounter (Signed)
 Copied from CRM [id number]. Topic: Clinical - Medication Refill >> Apr 02, 2023  9:45 AM Miller H wrote: Most Recent Primary Care Visit:  Provider: ANTONETTA ROLLENE BRAVO  Department: [facility]  Visit Type: HOSPITAL FU  Date: 12/31/2022  Medication: DULoxetine  (CYMBALTA ) 60 MG capsule [[id number]]  Has the patient contacted their pharmacy? Yes (Agent: If no, request that the patient contact the pharmacy for the refill. If patient does not wish to contact the pharmacy document the reason why and proceed with request.) (Agent: If yes, when and what did the pharmacy advise?)Pharmacy called in for refill  Is this the correct pharmacy for this prescription? Yes If no, delete pharmacy and type the correct one.  This is the patient's preferred pharmacy:  [facility] - [place], [place] - [address] [place] [address] Phone: [phone] Fax: [phone]     Has the prescription been filled recently? No  Is the patient out of the medication? Yes  Has the patient been seen for an appointment in the last year OR does the patient have an upcoming appointment? No  Can we respond through MyChart? Yes  Agent: Please be advised that Rx refills may take up to 3 business days. We ask that you follow-up with your pharmacy.

## 2023-04-05 ENCOUNTER — Ambulatory Visit: Payer: Self-pay | Admitting: *Deleted

## 2023-04-05 DIAGNOSIS — D631 Anemia in chronic kidney disease: Secondary | ICD-10-CM | POA: Diagnosis not present

## 2023-04-05 DIAGNOSIS — D509 Iron deficiency anemia, unspecified: Secondary | ICD-10-CM | POA: Diagnosis not present

## 2023-04-05 DIAGNOSIS — N2581 Secondary hyperparathyroidism of renal origin: Secondary | ICD-10-CM | POA: Diagnosis not present

## 2023-04-05 DIAGNOSIS — Z992 Dependence on renal dialysis: Secondary | ICD-10-CM | POA: Diagnosis not present

## 2023-04-05 DIAGNOSIS — N186 End stage renal disease: Secondary | ICD-10-CM | POA: Diagnosis not present

## 2023-04-05 DIAGNOSIS — N250 Renal osteodystrophy: Secondary | ICD-10-CM | POA: Diagnosis not present

## 2023-04-05 MED ORDER — DULOXETINE HCL 60 MG PO CPEP: ORAL_CAPSULE | ORAL | 11 refills | Status: DC

## 2023-04-05 NOTE — Patient Outreach (Signed)
 Care Coordination   Follow Up Visit Note   04/05/2023  Name: Kathryn Beck MRN: [id number] DOB: 06-18-1955  Kathryn Beck is a 68 y.o. year old female who sees Antonetta Rollene BRAVO, MD for primary care. I spoke with [name] and daughter, [name] by phone today.  What matters to the patients health and wellness today?  Receive Counseling & Supportive Services to Help Cope with Chronic Illnesses.    Goals Addressed               This Visit's Progress     Receive Counseling & Supportive Services to Help Cope with Chronic Illnesses. 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, Confusion, Dialysis.]  General Interventions   General Interventions Discussed/Reviewed General Interventions Discussed, Labs, Vaccines, Doctor Visits, General Interventions Reviewed, Annual Eye Exam, Durable Medical Equipment (DME), [employer], Level of Care, Communication with, Annual Foot Exam, Health Screening  [Encouraged Routine Engagement with Care Team Members & Providers.]  Labs Hgb A1c every 3 months, Kidney Function, Hgb A1c annually  [Encouraged Routine Labwork.]  Vaccines COVID-19, Pneumonia, Flu, RSV, Shingles, Tetanus/Pertussis/Diphtheria  [Encouraged Routine Vaccinations.]  Doctor Visits Discussed/Reviewed Doctor Visits Discussed, Doctor Visits Reviewed, Specialist, Annual Wellness Visits, PCP  [Encouraged Routine Engagement with Care Team Members & Providers.]  Health Screening Bone Density,  Colonoscopy, Mammogram, Prostate  [Encouraged Routine Health Screenings.]  Durable Medical Equipment (DME) Bed side commode, BP Cuff, Environmental Consultant, Wheelchair, Glucomoter, Insulin  Pump, Tour manager, Other  [Prescription Eyeglasses, Medical Laboratory Scientific Officer, Sports Administrator, Raised Engineer, Civil (consulting) with Lid, Hand-Held Chief Strategy Officer, Fish Farm Manager, Etc.]  Wheelchair Standard  PCP/Specialist Visits Compliance with follow-up visit  [Encouraged Routine Engagement with Care Team Members & Providers.]  Communication with Social Work, Engineer, Water, CHARITY FUNDRAISER, PCP/Specialists  [Encouraged Routine Engagement with Care Team Members & Providers.]  Level of Care Adult Daycare, Air Traffic Controller, Assisted Living, Skilled Nursing Facility  [Confirmed Disinterest in Enrollment in Adult Day Care Program or Receiving Assistance Pursuing Higher Level of Care Placement Options (I.e Assisted Living Versus Skilled Nursing Facility).]  Applications Medicaid, Personal Care Services  [Confirmed Disinterest in Receiving Assistance Applying for Medicaid, Personal Care Services or Aid & Attendance Benefits.]  Exercise Interventions   Exercise Discussed/Reviewed Exercise Discussed, Assistive device use and maintanence, Exercise Reviewed, Physical Activity, Weight Managment  [Encouraged Increased Level of Activity & Exercise, Inside & Outside the Home.]  Physical Activity Discussed/Reviewed Physical Activity Discussed, Home Exercise Program (HEP), PREP, Physical Activity Reviewed, Gym, Types of exercise  [Encouraged Daily Exercise Regimen, as Tolerated.]  Weight Management Weight loss  [Encouraged Healthy Weight Loss Regimen.]  Education Interventions   Education Provided Provided Therapist, Sports, Provided Education  [employer] Reviewed Educational Material Provided & Encouraged Continued Independent Review with Family.]  Provided Verbal Education On Nutrition, Mental Health/Coping with Illness, When to see the doctor, Foot Care, Eye Care, Labs, Blood Sugar Monitoring,  Applications, Exercise, Medication, Programmer, Applications, Development Worker, Community  [employer] Implementation of Educational Material Reviewed.]  Labs Reviewed Hgb A1c  [Reviewed & Encouraged Logging Results.]  Ship Broker, Personal Care Services  [Confirmed Disinterest in Receiving Assistance Applying for [employer], Personal Care  Services or Aid & Attendance Benefits.]  Mental Health Interventions   Mental Health Discussed/Reviewed Mental Health Discussed, Anxiety, Mental Health Reviewed, Coping Strategies, Crisis, Other, Suicide, Substance Abuse, Grief and Loss, Depression  [Assessed Mental Health & Cognitive Status.]  Nutrition Interventions   Nutrition Discussed/Reviewed Nutrition Discussed, Adding fruits and vegetables, Increasing proteins, Decreasing fats, Nutrition Reviewed, Fluid intake, Carbohydrate meal planning, Portion sizes, Decreasing salt, Decreasing sugar intake  [Encouraged Heart-Healthy, Diabetic-Friendly, Low Fat, Low Sodium, Reduced Sugar Diet.]  Pharmacy Interventions   Pharmacy Dicussed/Reviewed Pharmacy Topics Discussed, Medications and their functions, Medication Adherence, Pharmacy Topics Reviewed, Affording Medications  [Confirmed Ability to Afford Prescription Medications.]  Medication Adherence --  [Confirmed Prescription Medication Compliance.]  Safety Interventions   Safety Discussed/Reviewed Safety Discussed, Safety Reviewed, Fall Risk, Home Safety  [Encouraged Continued Involvement with Home Health Physical Therapy.]  Home Safety Assistive Devices, Contact home health agency, Refer for community resources  [Encouraged Routine Use of Assistive Devices & Durable Medical Equipment.]  Advanced Directive Interventions   Advanced Directives Discussed/Reviewed Advanced Directives Discussed, Advanced Directives Reviewed  [Confirmed Initiation of Advanced Directives (Living Will & Healthcare Power of Attorney Documents), Requesting Copies to Scan into Electronic Medical Record in  Epic.]       Active Listening & Reflection Utilized.  Verbalization of Feelings Encouraged. Emotional Support Provided. Cognitive Behavioral Therapy Initiated.  Acceptance & Commitment Therapy Performed. Encouraged Daily Implementation of Deep Breathing Exercises, Relaxation Techniques & Mindfulness Meditation Strategies. Encouraged Journaling As a Means to Express Thoughts, Feelings & Emotions. CSW Collaboration with Daughter, [name] Perkins to [facility] Therapy Services in [place], through [facility] [phone]). CSW Collaboration with Daughter, [name] to Encourage Routine Engagement with Philippe Desanctis, Licensed Clinical Social Worker with [facility] [phone]), if She Has Questions, Needs Assistance, or If Additional Social Work Needs Are Identified Between Now & Our Next Follow-Up Outreach Call, Scheduled on 05/03/2023 at 9:00 AM.      SDOH assessments and interventions completed:  Yes.  Care Coordination Interventions:  Yes, provided.   Follow up plan: Follow up call scheduled for 05/03/2023 at 9:00 am.  Encounter Outcome:  Patient Visit Completed.   Philippe Desanctis, BSW, MSW, Printmaker Social Work Case Production Designer, Theatre/television/film  [facility], Population Health Direct Dial : [phone]  Fax: [phone] Email: [email or web address] Website: [email or web address]

## 2023-04-05 NOTE — Patient Instructions (Signed)
 Visit Information  Thank you for taking time to visit with me today. Please don't hesitate to contact me if I can be of assistance to you.   Following are the goals we discussed today:   Goals Addressed               This Visit's Progress     Receive Counseling & Supportive Services to Help Cope with Chronic Illnesses. 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, Confusion, Dialysis.]  General Interventions   General Interventions Discussed/Reviewed General Interventions Discussed, Labs, Vaccines, Doctor Visits, General Interventions Reviewed, Annual Eye Exam, Durable Medical Equipment (DME), [employer], Level of Care, Communication with, Annual Foot Exam, Health Screening  [Encouraged Routine Engagement with Care Team Members & Providers.]  Labs Hgb A1c every 3 months, Kidney Function, Hgb A1c annually  [Encouraged Routine Labwork.]  Vaccines COVID-19, Pneumonia, Flu, RSV, Shingles, Tetanus/Pertussis/Diphtheria  [Encouraged Routine Vaccinations.]  Doctor Visits Discussed/Reviewed Doctor Visits Discussed, Doctor Visits Reviewed, Specialist, Annual Wellness Visits, PCP  [Encouraged Routine Engagement with Care Team Members & Providers.]  Health Screening Bone Density, Colonoscopy, Mammogram, Prostate  [Encouraged Routine Health Screenings.]  Durable Medical Equipment (DME) Bed side commode, BP Cuff, Environmental Consultant, Wheelchair, Glucomoter, Insulin  Pump, Tour manager, Other  [Prescription Eyeglasses, Medical Laboratory Scientific Officer, Sports Administrator,  Raised Engineer, Civil (consulting) with Lid, Manufacturing Systems Engineer, Fish Farm Manager, Etc.]  Wheelchair Standard  PCP/Specialist Visits Compliance with follow-up visit  [Encouraged Routine Engagement with Care Team Members & Providers.]  Communication with Social Work, Engineer, Water, CHARITY FUNDRAISER, PCP/Specialists  [Encouraged Routine Engagement with Care Team Members & Providers.]  Level of Care Adult Daycare, Air Traffic Controller, Assisted Living, Skilled Nursing Facility  [Confirmed Disinterest in Enrollment in Adult Day Care Program or Receiving Assistance Pursuing Higher Level of Care Placement Options (I.e Assisted Living Versus Skilled Nursing Facility).]  Applications Medicaid, Personal Care Services  [Confirmed Disinterest in Receiving Assistance Applying for Medicaid, Personal Care Services or Aid & Attendance Benefits.]  Exercise Interventions   Exercise Discussed/Reviewed Exercise Discussed, Assistive device use and maintanence, Exercise Reviewed, Physical Activity, Weight Managment  [Encouraged Increased Level of Activity & Exercise, Inside & Outside the Home.]  Physical Activity Discussed/Reviewed Physical Activity Discussed, Home Exercise Program (HEP), PREP, Physical Activity Reviewed, Gym, Types of exercise  [Encouraged Daily Exercise Regimen, as Tolerated.]  Weight Management Weight loss  [Encouraged Healthy Weight Loss Regimen.]  Education Interventions   Education Provided Provided Therapist, Sports, Provided Education  [employer] Reviewed Educational Material Provided & Encouraged Continued Independent Review with Family.]  Provided Verbal Education On Nutrition, Mental Health/Coping with Illness, When to see the doctor, Foot Care, Eye Care, Labs, Blood Sugar Monitoring, Applications, Exercise, Medication, Programmer, Applications, Development Worker, Community  [employer] Implementation of Educational Material Reviewed.]  Labs Reviewed Hgb A1c  [Reviewed & Encouraged Logging Results.]  Ship Broker, Personal Care Services   [Confirmed Disinterest in Receiving Assistance Applying for [employer], Development Worker, International Aid or Aid & Attendance Benefits.]  Mental Health Interventions   Mental Health Discussed/Reviewed Mental Health Discussed, Anxiety, Mental Health Reviewed, Coping Strategies, Crisis, Other, Suicide, Substance Abuse, Grief and Loss, Depression  [Assessed Mental Health & Cognitive Status.]  Nutrition Interventions   Nutrition Discussed/Reviewed Nutrition Discussed, Adding fruits and vegetables, Increasing proteins, Decreasing fats, Nutrition Reviewed, Fluid intake, Carbohydrate meal planning, Portion sizes, Decreasing salt, Decreasing sugar intake  [Encouraged Heart-Healthy, Diabetic-Friendly, Low Fat, Low Sodium, Reduced Sugar Diet.]  Pharmacy Interventions   Pharmacy Dicussed/Reviewed Pharmacy Topics Discussed, Medications and their functions, Medication Adherence, Pharmacy Topics Reviewed, Affording Medications  [Confirmed Ability to Afford Prescription Medications.]  Medication Adherence --  [Confirmed Prescription Medication Compliance.]  Safety Interventions   Safety Discussed/Reviewed Safety Discussed, Safety Reviewed, Fall Risk, Home Safety  [Encouraged Continued Involvement with Home Health Physical Therapy.]  Home Safety Assistive Devices, Contact home health agency, Refer for community resources  [Encouraged Routine Use of Assistive Devices & Durable Medical Equipment.]  Advanced Directive Interventions   Advanced Directives Discussed/Reviewed Advanced Directives Discussed, Advanced Directives Reviewed  [Confirmed Initiation of Advanced Directives (Living Will & Healthcare Power of Attorney Documents), Requesting Copies to Scan into Electronic Medical Record in Epic.]       Active Listening & Reflection Utilized.  Verbalization of Feelings Encouraged. Emotional Support Provided. Cognitive Behavioral Therapy Initiated.  Acceptance & Commitment Therapy Performed. Encouraged Daily Implementation of  Deep Breathing Exercises, Relaxation Techniques & Mindfulness Meditation Strategies. Encouraged Journaling As a Means to Express Thoughts, Feelings & Emotions. CSW Collaboration with Daughter, [name] Perkins to [facility] Health Skilled Nursing & Physical Therapy Services in [place], through [facility] [phone]). CSW Collaboration with Daughter, [name] to Encourage Routine Engagement with Philippe Desanctis, Licensed Clinical Social Worker with [facility] [phone]), if She Has Questions, Needs Assistance, or If Additional Social Work Needs Are Identified Between Now & Our Next Follow-Up Outreach Call, Scheduled on 05/03/2023 at 9:00 AM.      Our next appointment is by telephone on 05/03/2023 at 9:00 am.  Please call the care guide team at [phone] if you need to cancel or reschedule your appointment.   If you are experiencing a Mental Health or Behavioral Health Crisis or need someone to talk to, please call the Suicide and Crisis Lifeline: 988 call the [place]  National Suicide Prevention Lifeline: 279-671-7567 or TTY: 2518160245 TTY 909-618-8631) to talk to a trained counselor call 1-800-273-TALK (toll free, 24 hour hotline) go to [facility] [address] [place] [phone]) call the [facility] Crisis Line: (213)554-5982 call 911  Patient verbalizes understanding of instructions and care plan provided today and agrees to view in MyChart. Active MyChart status and patient understanding of how to access instructions and care plan via MyChart confirmed with patient.     Telephone follow up appointment with care management team member scheduled for:   05/03/2023 at 9:00 am.  Philippe Desanctis, BSW, MSW, LCSW  Embedded Practice Social Work Case Manager  [facility], Population Health Direct Dial : [phone]  Fax: [phone] Email:  [email or web address] Website: [email or web address]

## 2023-04-07 DIAGNOSIS — N250 Renal osteodystrophy: Secondary | ICD-10-CM | POA: Diagnosis not present

## 2023-04-07 DIAGNOSIS — D509 Iron deficiency anemia, unspecified: Secondary | ICD-10-CM | POA: Diagnosis not present

## 2023-04-07 DIAGNOSIS — Z992 Dependence on renal dialysis: Secondary | ICD-10-CM | POA: Diagnosis not present

## 2023-04-07 DIAGNOSIS — N2581 Secondary hyperparathyroidism of renal origin: Secondary | ICD-10-CM | POA: Diagnosis not present

## 2023-04-07 DIAGNOSIS — N186 End stage renal disease: Secondary | ICD-10-CM | POA: Diagnosis not present

## 2023-04-07 DIAGNOSIS — D631 Anemia in chronic kidney disease: Secondary | ICD-10-CM | POA: Diagnosis not present

## 2023-04-08 ENCOUNTER — Encounter (HOSPITAL_COMMUNITY): Payer: Self-pay | Admitting: Hematology

## 2023-04-08 ENCOUNTER — Telehealth: Payer: Self-pay | Admitting: Orthopedic Surgery

## 2023-04-08 ENCOUNTER — Encounter (HOSPITAL_BASED_OUTPATIENT_CLINIC_OR_DEPARTMENT_OTHER): Payer: Self-pay | Admitting: Family

## 2023-04-08 ENCOUNTER — Ambulatory Visit (HOSPITAL_BASED_OUTPATIENT_CLINIC_OR_DEPARTMENT_OTHER): Payer: Medicare Other | Admitting: Family

## 2023-04-08 VITALS — BP 168/73 | HR 79 | Ht 64.0 in | Wt 172.0 lb

## 2023-04-08 DIAGNOSIS — Z992 Dependence on renal dialysis: Secondary | ICD-10-CM

## 2023-04-08 DIAGNOSIS — E1151 Type 2 diabetes mellitus with diabetic peripheral angiopathy without gangrene: Secondary | ICD-10-CM | POA: Diagnosis not present

## 2023-04-08 DIAGNOSIS — I10 Essential (primary) hypertension: Secondary | ICD-10-CM

## 2023-04-08 DIAGNOSIS — Z794 Long term (current) use of insulin: Secondary | ICD-10-CM

## 2023-04-08 DIAGNOSIS — N186 End stage renal disease: Secondary | ICD-10-CM | POA: Diagnosis not present

## 2023-04-08 DIAGNOSIS — G4733 Obstructive sleep apnea (adult) (pediatric): Secondary | ICD-10-CM

## 2023-04-08 MED ORDER — AMLODIPINE BESYLATE 10 MG PO TABS: 10.0000 mg | ORAL_TABLET | Freq: Every day | ORAL | 1 refills | Status: DC

## 2023-04-08 NOTE — Telephone Encounter (Signed)
Sarah (RN) from [facility] called stating seen pt and wound is not progressing and only wearing Dr Lajoyce Corners compression sock. Pt needs a new wound care order. Sarah asked if pt has an appt but pt did a NO Show on 1/2. Maralyn Sago states she will reach out to pt daughter for pt to get back on [facility] schedule.Sarah secure number is [phone].

## 2023-04-08 NOTE — Progress Notes (Signed)
Advanced Hypertension Clinic Assessment:    Date:  04/11/2023   ID:  Kathryn Beck, DOB June 27, 1955, MRN [id number]  PCP:  [name], MD  Cardiologist:  Nona Dell, MD  Nephrologist:  Referring MD: [name], MD   CC: Hypertension  History of Present Illness:    Kathryn Beck is a 68 y.o. female with a hx of hypertension, ESRD on HD, DM2, AS, chronic diastolic heart failure, sleep apnea, PAD s/p R BKA 05/29/22, CVA, HLD with statin intolerance  here to follow up in the [facility]. She follows with Dr. Diona Browner.   Prior cardiac catheterization October 2020 with normal coronary arteries. Echo 01/17/19 normal LVEF 60-65%, moderate LVH, impaired diastolic function, RV normal, mild MR.  Referred by her PCP, Dr. Lodema Hong, for uncontrolled hypertension as well as episodes of hypotension during HD. Established with [facility] 04/16/22. Kathryn Beck was diagnosed with hypertension in her 30s. At initial visit noted to be taking Clonidine daily, increased to BID. She was recommended for carotid duplex due to prior stenosis by CT 2019 and echocardiogram to assess LVH. She was referred to PREP exercise program.   She was seen 05/11/22 by PCP team with BP 105/67 after admission 04/2022 with normal or low normal  BP recommended to stop Amlodipine, Clonidine. At visit with PCP, recommended to stop Amlodipine, Clonidine, Imdur. Recommended to discuss stopping Lasix with nephrology team.  Multiple admissions since that time. 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 10/2022 acute respiratory failure with hypoxia. 11/2022 admitted after fall with syncope workup revealing stable echo/carotids. No acute CVA and MRI with chronic findings. Of note BP control during all of these admissions was suboptimal.   Note from 05/12/22 with discontinuation of Amlodipine,  Clonidine, Imdur due to BP 105/67.   Presents today for follow up with her husband. She waits to take all of her medications until after dialysis. Reports dialysis going well. Reports BP has been "good" but cannot recall specific readings. Feeling overall well. Working with prosthesis to try to move more.   Previous antihypertensives: ACE - anaphylaxis, swelling   Past Medical History:  Diagnosis Date   Acid reflux    Amputated left leg (HCC)    Anemia    Arthritis    Axillary masses    Soft tissue - status post excision   Back pain    Cancer (HCC)    CHF (congestive heart failure) (HCC)    COVID-19 virus infection 04/06/2019   Depression    End-stage renal disease (HCC)    M/W/F dialysis   Essential hypertension    Headache    years ago   History of blood transfusion    History of cardiac catheterization    Normal coronary arteries October 2020   History of claustrophobia    History of pneumonia 2019   Hypoxia 04/03/2019   Memory loss    Mixed hyperlipidemia    Obesity    Pancreatitis    Peritoneal dialysis catheter in place [facility])    Pneumonia due to COVID-19 virus 04/02/2019   Sleep apnea    Noncompliant with CPAP   Stroke (HCC)    mini stroke   Type 2 diabetes mellitus (HCC)     Past Surgical History:  Procedure Laterality Date   ABDOMINAL AORTOGRAM W/LOWER EXTREMITY N/A 04/30/2022   Procedure: ABDOMINAL AORTOGRAM W/LOWER EXTREMITY;  Surgeon:  Leonie Douglas, MD;  [facility]: [facility];  Service: Cardiovascular;  Laterality: N/A;   ABDOMINAL AORTOGRAM W/LOWER EXTREMITY N/A 07/21/2022   Procedure: ABDOMINAL AORTOGRAM W/LOWER EXTREMITY;  Surgeon: Nada Libman, MD;  Location: [facility];  Service: Cardiovascular;  Laterality: N/A;   ABDOMINAL HYSTERECTOMY     ACHILLES TENDON LENGTHENING  08/15/2022   Procedure: ACHILLES TENDON LENGTHENING;  Surgeon: [name], DPM;  Location: [facility] OR;  Service: Podiatry;;   AMPUTATION Right 05/29/2022    Procedure: RIGHT BELOW THE KNEE AMPUTATION;  Surgeon: [name], MD;  Location: [facility] OR;  Service: Orthopedics;  Laterality: Right;   AMPUTATION Left 09/04/2022   Procedure: AMPUTATION FOOT, serial irrigation;  Surgeon: Louann Sjogren, DPM;  Location: [facility] OR;  Service: Podiatry;  Laterality: Left;  Surgical team to do block   AMPUTATION Left 10/07/2022   Procedure: LEFT BELOW KNEE AMPUTATION;  Surgeon: [name], MD;  Location: [facility] OR;  Service: Orthopedics;  Laterality: Left;   AV FISTULA PLACEMENT Left 09/02/2017   Procedure: creation of left arm ARTERIOVENOUS (AV) FISTULA;  Surgeon: Nada Libman, MD;  Location: [facility] OR;  Service: Vascular;  Laterality: Left;   COLONOSCOPY  2008   Dr. Darrick Penna: normal    COLONOSCOPY N/A 12/18/2016   Dr. Darrick Penna: multiple tubular adenomas, internal hemorrhoids. Surveillance in 3 years    ESOPHAGEAL DILATION N/A 10/13/2015   Procedure: ESOPHAGEAL DILATION;  Surgeon: Malissa Hippo, MD;  [facility]: [facility];  Service: Endoscopy;  Laterality: N/A;   ESOPHAGOGASTRODUODENOSCOPY N/A 10/13/2015   Dr. Karilyn Cota: chronic gastritis on path, no H.pylori. Empiric dilation    ESOPHAGOGASTRODUODENOSCOPY N/A 12/18/2016   Dr. Darrick Penna: mild gastritis. BRAVO study revealed uncontrolled GERD. Dysphagia secondary to uncontrolled reflux   FOOT SURGERY Bilateral    "nerve"     LEFT HEART CATH AND CORONARY ANGIOGRAPHY N/A 12/29/2018   Procedure: LEFT HEART CATH AND CORONARY ANGIOGRAPHY;  Surgeon: Corky Crafts, MD;  Location: [facility] INVASIVE CV LAB;  Service: Cardiovascular;  Laterality: N/A;   LOWER EXTREMITY ANGIOGRAPHY Right 05/04/2022   Procedure: Lower Extremity Angiography;  Surgeon: Victorino Sparrow, MD;  Location: [facility] INVASIVE CV LAB;  Service: Cardiovascular;  Laterality: Right;   LUNG BIOPSY     MASS EXCISION Right 01/09/2013   Procedure: EXCISION OF NEOPLASM OF RIGHT  AXILLA  AND EXCISION OF NEOPLASM OF LEFT AXILLA;  Surgeon: Dalia Heading, MD;  Location: [facility] ORS;   Service: General;  Laterality: Right;  procedure end @ 08:23   MYRINGOTOMY WITH TUBE PLACEMENT Bilateral 04/28/2017   Procedure: BILATERAL MYRINGOTOMY WITH TUBE PLACEMENT;  Surgeon: Newman Pies, MD;  Location: [facility] OR;  Service: ENT;  Laterality: Bilateral;   PERIPHERAL VASCULAR BALLOON ANGIOPLASTY Right 05/04/2022   Procedure: PERIPHERAL VASCULAR BALLOON ANGIOPLASTY;  Surgeon: Victorino Sparrow, MD;  Location: [facility] INVASIVE CV LAB;  Service: Cardiovascular;  Laterality: Right;  PT   PERIPHERAL VASCULAR INTERVENTION Right 05/04/2022   Procedure: PERIPHERAL VASCULAR INTERVENTION;  Surgeon: Victorino Sparrow, MD;  Location: [facility] INVASIVE CV LAB;  Service: Cardiovascular;  Laterality: Right;  SFA   PERIPHERAL VASCULAR INTERVENTION Left 07/21/2022   Procedure: PERIPHERAL VASCULAR INTERVENTION;  Surgeon: Nada Libman, MD;  [facility]: [facility];  Service: Cardiovascular;  Laterality: Left;   REVISION OF ARTERIOVENOUS GORETEX GRAFT Left 05/04/2018   Procedure: TRANSPOSITION OF CEPHALIC VEIN ARTERIOVENOUS FISTULA LEFT ARM;  Surgeon: Larina Earthly, MD;  Location: [facility] OR;  Service: Vascular;  Laterality: Left;  SAVORY DILATION N/A 12/18/2016   Procedure: SAVORY DILATION;  Surgeon: West Bali, MD;  [facility];  Service: Endoscopy;  Laterality: N/A;   TRANSMETATARSAL AMPUTATION Left 08/15/2022   Procedure: TRANSMETATARSAL AMPUTATION;  Surgeon: [name], DPM;  Location: [facility] OR;  Service: Podiatry;  Laterality: Left;    Current Medications: Current Meds  Medication Sig   aspirin 81 MG chewable tablet Chew 2 tablets (162 mg total) by mouth daily.   B Complex-C-Folic Acid (RENA-VITE RX) 1 MG TABS Take 1 tablet by mouth daily.   Blood Glucose Monitoring Suppl (ONETOUCH VERIO) w/Device KIT Use to check blood sugar 4X daily.   calcium acetate (PHOSLO) 667 MG tablet Take 667 mg by mouth 3 (three) times daily.   cinacalcet (SENSIPAR) 30 MG tablet Take 3 tablets (90 mg total) by mouth 3 (three)  times a week.   clopidogrel (PLAVIX) 75 MG tablet Take 1 tablet (75 mg total) by mouth daily.   Continuous Blood Gluc Receiver (FREESTYLE LIBRE 2 READER) DEVI 1 each by Does not apply route daily.   Continuous Blood Gluc Sensor (FREESTYLE LIBRE 2 SENSOR) MISC 1 each by Does not apply route every 14 (fourteen) days.   DROPLET PEN NEEDLES 32G X 4 MM MISC USE FOUR TIMES DAILY   DULoxetine (CYMBALTA) 60 MG capsule TAKE (1) CAPSULE BY MOUTH ONCE DAILY.   ezetimibe (ZETIA) 10 MG tablet Take 1 tablet (10 mg total) by mouth daily.   FIASP FLEXTOUCH 100 UNIT/ML FlexTouch Pen INJECT 18 TO 30 UNITS INTO THE SKIN WITH BREAKFAST, WITH LUNCH, AND WITH EVENING MEAL (Patient taking differently: Inject 1-10 Units into the skin 3 (three) times daily. Sliding scale)   glucose blood (ONETOUCH VERIO) test strip Use as instructed to check blood sugar 4X daily.   insulin glargine, 2 Unit Dial, (TOUJEO MAX SOLOSTAR) 300 UNIT/ML Solostar Pen Inject 30 Units into the skin daily. May need to slowly increase the dose depending upon your blood sugar, follow-up with PCP   metoprolol succinate (TOPROL-XL) 50 MG 24 hr tablet TAKE (1) TABLET BY MOUTH DAILY WITH FOOD *TAKE AFTER DIALYSIS*   OneTouch Delica Lancets 33G MISC Use to check blood sugar 4X daily.   torsemide (DEMADEX) 100 MG tablet Take 100 mg by mouth See admin instructions. Take in Tues,Thursday,Sat & Sun   [name] 500 MG chewable tablet Chew 1-2 tablets (500-1,000 mg total) by mouth See admin instructions. Take 1000mg  (2 tablets) by mouth with meals and 500mg  (1 tablet) with snacks   [DISCONTINUED] amLODipine (NORVASC) 5 MG tablet TAKE ONE TABLET BY MOUTH EVERY DAY     Allergies:   Ace inhibitors, Penicillins, Statins, and Albuterol   Social History   Socioeconomic History   Marital status: Married    Spouse name: Randeep Colglazier   Number of children: 2   Years of education: 12   Highest education level: 12th grade  Occupational History   Occupation: retired    Tobacco Use   Smoking status: Never    Passive exposure: Never   Smokeless tobacco: Never   Tobacco comments:    Verified by Daughter, [name]  Vaping Use   Vaping status: Never Used  Substance and Sexual Activity   Alcohol use: No   Drug use: No   Sexual activity: Not Currently    Partners: Male  Other Topics Concern   Not on file  Social History Narrative   Lives alone with husband    Right handed   Caffeine-1/2 daily  Social Drivers of Corporate investment banker Strain: Low Risk  (02/08/2023)   Overall Financial Resource Strain (CARDIA)    Difficulty of Paying Living Expenses: Not hard at all  Food Insecurity: No Food Insecurity (02/08/2023)   Hunger Vital Sign    Worried About Running Out of Food in the Last Year: Never true    Ran Out of Food in the Last Year: Never true  Transportation Needs: No Transportation Needs (02/08/2023)   PRAPARE - Administrator, Civil Service (Medical): No    Lack of Transportation (Non-Medical): No  Physical Activity: Inactive (02/08/2023)   Exercise Vital Sign    Days of Exercise per Week: 0 days    Minutes of Exercise per Session: 0 min  Stress: No Stress Concern Present (02/08/2023)   [employer] of Occupational Health - Occupational Stress Questionnaire    Feeling of Stress : Not at all  Social Connections: Moderately Integrated (02/08/2023)   Social Connection and Isolation Panel [NHANES]    Frequency of Communication with Friends and Family: More than three times a week    Frequency of Social Gatherings with Friends and Family: More than three times a week    Attends Religious Services: More than 4 times per year    Active Member of [employer] or Organizations: No    Attends Banker Meetings: Never    Marital Status: Married     Family History: The patient's family history includes Arthritis in her father; Breast cancer in her sister; Hypercholesterolemia in her father and sister;  Hypertension in her father, sister, and sister. There is no history of Colon cancer or Colon polyps.  ROS:   Please see the history of present illness.     All other systems reviewed and are negative.  EKGs/Labs/Other Studies Reviewed:    EKG:  EKG is  ordered today.  The ekg ordered today demonstrates NSR 80 bpm with no acute ST/T wave changes.  Recent Labs: 12/28/2022: ALT 42; B Natriuretic Peptide 2,982.0; BUN 60; Creatinine, Ser 6.73; Hemoglobin 10.8; Magnesium 2.2; Platelets 262; Potassium 3.7; Sodium 142   Recent Lipid Panel    Component Value Date/Time   CHOL 123 12/18/2022 0413   TRIG 105 12/18/2022 0413   HDL 41 12/18/2022 0413   CHOLHDL 3.0 12/18/2022 0413   VLDL 21 12/18/2022 0413   LDLCALC 61 12/18/2022 0413   LDLCALC 109 (H) 11/12/2017 0936   LDLDIRECT 120.0 11/21/2020 1402    Physical Exam:   VS:  BP (!) 168/73   Pulse 79   Ht 5\' 4"  (1.626 m)   Wt 172 lb (78 kg)   SpO2 97%   BMI 29.52 kg/m  , BMI Body mass index is 29.52 kg/m. GENERAL:  Well appearing HEENT: Pupils equal round and reactive, fundi not visualized, oral mucosa unremarkable NECK:  No jugular venous distention, waveform within normal limits, carotid upstroke brisk and symmetric, no bruits, no thyromegaly LYMPHATICS:  No cervical adenopathy LUNGS:  Clear to auscultation bilaterally HEART:  RRR.  PMI not displaced or sustained,S1 and S2 within normal limits, no S3, no S4, no clicks, no rubs, no murmurs ABD:  Flat, positive bowel sounds normal in frequency in pitch, no bruits, no rebound, no guarding, no midline pulsatile mass, no hepatomegaly, no splenomegaly EXT:  2 plus pulses throughout, no edema, no cyanosis no clubbing SKIN:  No rashes no nodules NEURO:  Cranial nerves II through XII grossly intact, motor grossly intact throughout PSYCH:  Cognitively intact,  oriented to person place and time   ASSESSMENT/PLAN:    HTN - BP not at goal <130/80. Antihypertensive regimen previously decreased  for hypotension. Increase to Amlodipine 10mg  every day. Continue Toprol 50mg  daily. Continue Torsemide per nephrology on non-HD days, does still make urine.  Cushings/Adrenal workup previously completed by Dr. Elvera Lennox of endocrinology detailed below.   HFpEF / AS - Volume management per HD. Echo 11/2022 LVEF 50-55%, gr2DD, elevated LVEDP, RVSF mildly reduced, trivial MR, severe calcification of AV with mild AS/AI.  PVD - s/p bilateral BKA. Follows with ortho and [facility].  OSA - CPAP titration ordered 05/2022 not yet completed. Will reach out to pulmonology team for recommendations.   ESRD on HD - Follows with nephrology.   Dm2 - Follows with Dr. Lafe Garin of endocrinology.  HLD - Did not tolerate statin due to elevated LFTs. Continue Zetia 10mg  QD. Aortic atherosclerosis by CT, consider disussion of PCSK9i at follow up. Not addressed at this clinic visit.   Bilateral adrenal adenoma - Followed by Dr. Elvera Lennox of endocrinology. MRI 2019 bilateral adrenal adenoma. 2019-2022 plasma catecholamines, metanephrines, aldosterone normal.   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 Prior workup for autonomous cortisol production. Pituitary MRI positive pituitary mass. Per Dr. Lafe Garin hormone producing adenoma or Cushing's disease - future investigation difficult due to dialysis. Referred ot [facility] but has not had appt.   Screening for Secondary Hypertension:     04/16/2022    8:23 PM  Causes  Sleep Apnea Screened  Thyroid Disease Screened  Pheochromocytoma Screened     - Comments per endocrinology  Cushing's Syndrome Screened     - Comments per endocrinology  Coarctation of the Aorta Screened     - Comments carotid duplex ordered 04/16/22  Compliance Screened     - Comments mix of pill pack and pill bottles makes compliance difficult to ascertain    Relevant Labs/Studies:    Latest Ref Rng & Units 12/28/2022    8:42 AM 12/17/2022    3:43 PM 11/19/2022    3:20 AM  Basic Labs  Sodium  135 - 145 mmol/L 142  138  141   Potassium 3.5 - 5.1 mmol/L 3.7  3.3  4.0   Creatinine 0.44 - 1.00 mg/dL 1.61  0.96  0.45        Latest Ref Rng & Units 10/28/2021   10:40 AM 12/24/2016   10:42 AM  Thyroid   TSH 0.35 - 5.50 uIU/mL 0.91  4.16        10/28/2021   10:40 AM 11/21/2020    2:02 PM 10/04/2018   12:07 PM 11/30/2017   10:53 AM  Renin/Aldosterone   Aldosterone CANCELED  1  2  3         Latest Ref Rng & Units 10/28/2021   10:40 AM 11/21/2020    2:02 PM 10/04/2018   12:07 PM 11/30/2017   10:53 AM  Metanephrines/Catecholamines   Epinephrine pg/mL 31  <20  24  see note   Norepinephrine pg/mL 736  332  167  99   Dopamine pg/mL <10  <10  <10  see note   Metanephrines <=57 pg/mL <25  <25  <25  <25   Normetanephrines  <=148 pg/mL 74  56  38  71        Latest Ref Rng & Units 07/22/2021    8:29 AM  Cortisol  Cortisol  ug/dL 40.9            Disposition:  FU with MD/PharmD in 2 mos   Medication Adjustments/Labs and Tests Ordered: Current medicines are reviewed at length with the patient today.  Concerns regarding medicines are outlined above.  No orders of the defined types were placed in this encounter.  Meds ordered this encounter  Medications   amLODipine (NORVASC) 10 MG tablet    Sig: Take 1 tablet (10 mg total) by mouth daily.    Dispense:  90 tablet    Refill:  1     Signed, Alver Sorrow, NP  04/11/2023 1:22 PM    [facility]

## 2023-04-08 NOTE — Patient Instructions (Signed)
Medication Instructions:  Your physician has recommended you make the following change in your medication:   INCREASE Amlodipine to 10 mg daily   Follow-Up: Follow up in 2 months in hypertension clinic   Special Instructions:  We will reach out to pulmonology for your sleep study.

## 2023-04-09 ENCOUNTER — Ambulatory Visit: Payer: Medicare PPO | Admitting: Family Medicine

## 2023-04-09 ENCOUNTER — Telehealth: Payer: Self-pay | Admitting: Family

## 2023-04-09 DIAGNOSIS — N186 End stage renal disease: Secondary | ICD-10-CM | POA: Diagnosis not present

## 2023-04-09 DIAGNOSIS — D509 Iron deficiency anemia, unspecified: Secondary | ICD-10-CM | POA: Diagnosis not present

## 2023-04-09 DIAGNOSIS — N2581 Secondary hyperparathyroidism of renal origin: Secondary | ICD-10-CM | POA: Diagnosis not present

## 2023-04-09 DIAGNOSIS — N250 Renal osteodystrophy: Secondary | ICD-10-CM | POA: Diagnosis not present

## 2023-04-09 DIAGNOSIS — D631 Anemia in chronic kidney disease: Secondary | ICD-10-CM | POA: Diagnosis not present

## 2023-04-09 DIAGNOSIS — Z992 Dependence on renal dialysis: Secondary | ICD-10-CM | POA: Diagnosis not present

## 2023-04-09 NOTE — Telephone Encounter (Signed)
Carmen with [employer] called in stating they need more information on leqvio. She states you can either fax or call.   - Complete diagnosis  - verify pt's insurance information   Fax [phone]

## 2023-04-09 NOTE — Telephone Encounter (Signed)
Updated paperwork faxed to [phone]. Confirmation obtained.

## 2023-04-11 ENCOUNTER — Encounter (HOSPITAL_BASED_OUTPATIENT_CLINIC_OR_DEPARTMENT_OTHER): Payer: Self-pay | Admitting: Family

## 2023-04-12 DIAGNOSIS — Z992 Dependence on renal dialysis: Secondary | ICD-10-CM | POA: Diagnosis not present

## 2023-04-12 DIAGNOSIS — N250 Renal osteodystrophy: Secondary | ICD-10-CM | POA: Diagnosis not present

## 2023-04-12 DIAGNOSIS — N2581 Secondary hyperparathyroidism of renal origin: Secondary | ICD-10-CM | POA: Diagnosis not present

## 2023-04-12 DIAGNOSIS — D509 Iron deficiency anemia, unspecified: Secondary | ICD-10-CM | POA: Diagnosis not present

## 2023-04-12 DIAGNOSIS — N186 End stage renal disease: Secondary | ICD-10-CM | POA: Diagnosis not present

## 2023-04-12 DIAGNOSIS — D631 Anemia in chronic kidney disease: Secondary | ICD-10-CM | POA: Diagnosis not present

## 2023-04-14 DIAGNOSIS — D631 Anemia in chronic kidney disease: Secondary | ICD-10-CM | POA: Diagnosis not present

## 2023-04-14 DIAGNOSIS — N186 End stage renal disease: Secondary | ICD-10-CM | POA: Diagnosis not present

## 2023-04-14 DIAGNOSIS — D509 Iron deficiency anemia, unspecified: Secondary | ICD-10-CM | POA: Diagnosis not present

## 2023-04-14 DIAGNOSIS — N2581 Secondary hyperparathyroidism of renal origin: Secondary | ICD-10-CM | POA: Diagnosis not present

## 2023-04-14 DIAGNOSIS — Z992 Dependence on renal dialysis: Secondary | ICD-10-CM | POA: Diagnosis not present

## 2023-04-14 DIAGNOSIS — N250 Renal osteodystrophy: Secondary | ICD-10-CM | POA: Diagnosis not present

## 2023-04-15 ENCOUNTER — Telehealth: Payer: Self-pay | Admitting: Pulmonary Disease

## 2023-04-15 DIAGNOSIS — G4733 Obstructive sleep apnea (adult) (pediatric): Secondary | ICD-10-CM

## 2023-04-15 NOTE — Telephone Encounter (Signed)
Her study was in 2020 , she was unable to tolerate PAP fullface mask then. I am not sure whether she is still has her old Philips machine or not. She still has symptoms of severe OSA. Hence I had ordered titration study with a plan to get her a new machine after that.  I would still prefer to have a PAP titration study even if that means a little bit of a delay .  Does not seem like her mobility will improve. I will ask my CMA to send an order for auto CPAP 5 to 15 cm to DME and arrange for an office visit in 6 to 8 weeks to follow-up

## 2023-04-15 NOTE — Telephone Encounter (Signed)
Order for CPAP placed.

## 2023-04-15 NOTE — Telephone Encounter (Signed)
-----   Message from Alver Sorrow sent at 04/11/2023  1:42 PM EST ----- Hi Dr. Vassie Loll,  When you saw her 05/2022 she was recommended for CPAP titration study. Since that time she has had bilateral BKA. She requires a 1-person assist to transfer from wheelchair to bed. Wanted to inquire if CPAP titrate study still preferred or if autoPAP more appropriate? Thanks,  Alver Sorrow, NP

## 2023-04-16 DIAGNOSIS — N186 End stage renal disease: Secondary | ICD-10-CM | POA: Diagnosis not present

## 2023-04-16 DIAGNOSIS — Z992 Dependence on renal dialysis: Secondary | ICD-10-CM | POA: Diagnosis not present

## 2023-04-16 DIAGNOSIS — N2581 Secondary hyperparathyroidism of renal origin: Secondary | ICD-10-CM | POA: Diagnosis not present

## 2023-04-16 DIAGNOSIS — N250 Renal osteodystrophy: Secondary | ICD-10-CM | POA: Diagnosis not present

## 2023-04-16 DIAGNOSIS — D631 Anemia in chronic kidney disease: Secondary | ICD-10-CM | POA: Diagnosis not present

## 2023-04-16 DIAGNOSIS — D509 Iron deficiency anemia, unspecified: Secondary | ICD-10-CM | POA: Diagnosis not present

## 2023-04-18 DIAGNOSIS — N2581 Secondary hyperparathyroidism of renal origin: Secondary | ICD-10-CM | POA: Diagnosis not present

## 2023-04-18 DIAGNOSIS — N250 Renal osteodystrophy: Secondary | ICD-10-CM | POA: Diagnosis not present

## 2023-04-18 DIAGNOSIS — Z992 Dependence on renal dialysis: Secondary | ICD-10-CM | POA: Diagnosis not present

## 2023-04-18 DIAGNOSIS — D631 Anemia in chronic kidney disease: Secondary | ICD-10-CM | POA: Diagnosis not present

## 2023-04-18 DIAGNOSIS — D509 Iron deficiency anemia, unspecified: Secondary | ICD-10-CM | POA: Diagnosis not present

## 2023-04-18 DIAGNOSIS — N186 End stage renal disease: Secondary | ICD-10-CM | POA: Diagnosis not present

## 2023-04-19 ENCOUNTER — Telehealth: Payer: Self-pay | Admitting: Pharmacy Technician

## 2023-04-19 ENCOUNTER — Telehealth: Payer: Self-pay | Admitting: Pharmacist

## 2023-04-19 ENCOUNTER — Other Ambulatory Visit (HOSPITAL_BASED_OUTPATIENT_CLINIC_OR_DEPARTMENT_OTHER): Payer: Self-pay

## 2023-04-19 ENCOUNTER — Other Ambulatory Visit: Payer: Self-pay | Admitting: Pharmacist

## 2023-04-19 DIAGNOSIS — G4733 Obstructive sleep apnea (adult) (pediatric): Secondary | ICD-10-CM

## 2023-04-19 DIAGNOSIS — Z992 Dependence on renal dialysis: Secondary | ICD-10-CM | POA: Diagnosis not present

## 2023-04-19 DIAGNOSIS — N186 End stage renal disease: Secondary | ICD-10-CM | POA: Diagnosis not present

## 2023-04-19 DIAGNOSIS — D509 Iron deficiency anemia, unspecified: Secondary | ICD-10-CM | POA: Diagnosis not present

## 2023-04-19 DIAGNOSIS — D631 Anemia in chronic kidney disease: Secondary | ICD-10-CM | POA: Diagnosis not present

## 2023-04-19 DIAGNOSIS — N250 Renal osteodystrophy: Secondary | ICD-10-CM | POA: Diagnosis not present

## 2023-04-19 DIAGNOSIS — N2581 Secondary hyperparathyroidism of renal origin: Secondary | ICD-10-CM | POA: Diagnosis not present

## 2023-04-19 NOTE — Telephone Encounter (Signed)
I have let [name] know she will need assistance.  She will not be scheduled until approved.  Kathryn Beck

## 2023-04-19 NOTE — Telephone Encounter (Signed)
Susa Day, Please enter the treatment plan and I will begin the auth process.  Thanks Selena Batten

## 2023-04-19 NOTE — Telephone Encounter (Signed)
Leqvio coverage assessment from received form the support center. Scanned in the media. Insurance requires PA.

## 2023-04-19 NOTE — Telephone Encounter (Signed)
Loma Sousa note:  Patient will be scheuled as soon as possible.  Auth Submission: APPROVED Site of care: Site of care: [facility] Payer: medicare a/b & uhc Medication & CPT/J Code(s) submitted: Leqvio (Inclisiran) [id number] Route of submission (phone, fax, portal): portal Phone # Fax # Auth type: Buy/Bill PB Units/visits requested: x3 doses Reference number: [id number] Approval from: 04/19/23 to 04/18/24     Patient assistance Pending [name] aware

## 2023-04-20 ENCOUNTER — Encounter: Payer: Self-pay | Admitting: Family

## 2023-04-20 ENCOUNTER — Ambulatory Visit (INDEPENDENT_AMBULATORY_CARE_PROVIDER_SITE_OTHER): Payer: Medicare Other | Admitting: Internal Medicine

## 2023-04-20 ENCOUNTER — Encounter (HOSPITAL_COMMUNITY): Payer: Self-pay | Admitting: Hematology

## 2023-04-20 ENCOUNTER — Encounter: Payer: Self-pay | Admitting: Internal Medicine

## 2023-04-20 VITALS — BP 120/70 | HR 73

## 2023-04-20 DIAGNOSIS — D3500 Benign neoplasm of unspecified adrenal gland: Secondary | ICD-10-CM | POA: Diagnosis not present

## 2023-04-20 DIAGNOSIS — R7989 Other specified abnormal findings of blood chemistry: Secondary | ICD-10-CM

## 2023-04-20 DIAGNOSIS — D352 Benign neoplasm of pituitary gland: Secondary | ICD-10-CM

## 2023-04-20 DIAGNOSIS — N186 End stage renal disease: Secondary | ICD-10-CM | POA: Diagnosis not present

## 2023-04-20 DIAGNOSIS — E782 Mixed hyperlipidemia: Secondary | ICD-10-CM

## 2023-04-20 DIAGNOSIS — Z794 Long term (current) use of insulin: Secondary | ICD-10-CM

## 2023-04-20 DIAGNOSIS — E1122 Type 2 diabetes mellitus with diabetic chronic kidney disease: Secondary | ICD-10-CM

## 2023-04-20 LAB — POCT GLYCOSYLATED HEMOGLOBIN (HGB A1C): Hemoglobin A1C: 8.4 % — AB (ref 4.0–5.6)

## 2023-04-20 NOTE — Patient Instructions (Addendum)
Please continue: - Toujeo 30 units in the evening - Fiasp:  - 10-20 units before meals (may skip Fiasp with b'fast before dialysis) For a small meal/snack, please take 5 units of Fiasp Use the following FiAsp Sliding scale: - 150-175: + 1 unit  - 176-200: + 2 units  - 201-225: + 3 units  - 226-250: + 4 units  - 251-275: + 5 units - 276-300: + 6 units   Try to start the sensor.  Please return in 3-4 months.

## 2023-04-20 NOTE — Progress Notes (Signed)
Patient ID: Kathryn Beck, female   DOB: 16-Jan-1956, 68 y.o.   MRN: [id number]  HPI: Kathryn Beck is a 68 y.o.-year-old female, initially referred by her PCP, Dr. Lodema Hong, returning for follow-up for of DM2, dx in 2008, insulin-dependent since ~2015, uncontrolled, with complications (ESRD, PN). She saw Dr. Fransico Him - last OV with him 05/2016.  Last visit with me 4 months ago. She is here with her daughter.  History is obtained from patient along with daughter as patient is mostly nonverbal.  Interim history: She continues hemodialysis, previously on peritoneal dialysis.  She was previously on the kidney transplant but not anymore as she needs better control of hypertension and diabetes. She denies increased urination, nausea, chest pain. She has blurry vision - misplaced glasses. Since last visit, she was in the emergency room 12/17/2022 for after a fall.  This happened after stopping Plavix due to side effects.  CVA was suspected but was not seen on imaging (only an old stroke).  DM2: Reviewed HbA1c levels: Lab Results  Component Value Date   HGBA1C 7.7 (A) 12/15/2022   HGBA1C 6.3 (H) 07/07/2022   HGBA1C 9.3 (A) 04/16/2022   HGBA1C 8.9 (A) 10/28/2021   HGBA1C 9.1 (A) 07/22/2021   HGBA1C 11.4 (A) 05/02/2021   HGBA1C 9.1 (A) 10/28/2020   HGBA1C 8.4 (A) 06/18/2020   HGBA1C 7.2 (A) 02/28/2020   HGBA1C 7.8 07/17/2019   HGBA1C 8.7 (H) 04/02/2019   HGBA1C 7.9 (A) 01/31/2019   HGBA1C 6.8 (A) 08/31/2018   HGBA1C 6.9 (H) 06/09/2018   HGBA1C 8.4 (H) 03/08/2018   HGBA1C 8.3 (H) 11/12/2017   HGBA1C 11.0 (H) 08/18/2017   HGBA1C 11.2 06/18/2017   HGBA1C 11.5 03/19/2017   HGBA1C 12.5 (H) 11/25/2016  11/01/2020: HbA1c 9.3% 02/09/2020: HbA1c 6.8%  In the past, pt. was on a regimen of: - Humalog 75/25 20-26 units before each meal, 2-3X a day depending on the CBG before the meal  - Levemir 100 units at bedtime  At last visit she was on: - Toujeo 54 >>  was only taking 34 units in the evening  -but out for 2 months - Humalog >> on NovoLog 70/30 24-30 units 3-4x a day (!!!????) 24 units before a smaller meal 26-28 units before a regular meal 30 units before a larger meal Take Humalog EVERY TIME you eat, 15 min before the meal, if sugars are >70. - Humalog sliding scale: - 150-175: + 1 unit  - 176-200: + 2 units  - 201-225: + 3 units  - 226-250: + 4 units  - 251-275: + 5 units - 276-300: + 6 units  Try not to correct sugars at bedtime unless they are >300 and in that case, take no more than 4 units.  Currently on: - Toujeo 40 >> 60 >> 80 units in the evening >> they decreased the dose to 40 units in am >> 30 units daily - Fiasp: up to 10 min before a meal: >> off this >> 10-20 units before meals - 40 units before b'fast and lunch - 30-35 units before dinner  FiAsp Sliding scale: - 150-175: + 1 unit  - 176-200: + 2 units  - 201-225: + 3 units  - 226-250: + 4 units  - 251-275: + 5 units - 276-300: + 6 units  Pt was checking her sugars 1-4x a day -off the CGM: - am: 90s-150 - 2h after b'fast: n/c - lunch: 160-170 - 2h after lunch: n/c - dinner: 200-210 - 2h after  dinner: - bedtime: 160-209  Previously:  Previously: 300s   Lowest sugar was 31 ... >> 40 >> ? In HD >> 80; it is unclear at which level she has hypoglycemia awareness. She was in the emergency room with hypoglycemia on 11/20/2022 (while in HD - ? Value). Highest sugar was HI >> 400s >> 300s  Glucometer: Accuchek  Pt's meals are: - Breakfast: eggs, bacon, oatmeal, grits - Lunch: salads - Dinner: meat + veggie or sandwich + salad or nabs - Snacks: nabs, fruit cups, jello  -+ ESRD, on dialysis. On irbesartan. Last CMP:   Chemistry      Component Value Date/Time   NA 142 12/28/2022 0842   NA 142 07/07/2022 1140   K 3.7 12/28/2022 0842   CL 102 12/28/2022 0842   CO2 27 12/28/2022 0842   BUN 60 (H) 12/28/2022 0842   BUN 19 07/07/2022 1140   CREATININE 6.73 (H) 12/28/2022 0842   CREATININE 3.74  (H) 08/23/2017 1407   GLU 211 07/17/2019 1157      Component Value Date/Time   CALCIUM 9.2 12/28/2022 0842   CALCIUM 9.4 10/15/2017 1258   ALKPHOS 93 12/28/2022 0842   AST 17 12/28/2022 0842   ALT 42 12/28/2022 0842   BILITOT 0.6 12/28/2022 0842   BILITOT 0.3 12/15/2022 1340     -+ HL; last set of lipids: Lab Results  Component Value Date   CHOL 123 12/18/2022   HDL 41 12/18/2022   LDLCALC 61 12/18/2022   LDLDIRECT 120.0 11/21/2020   TRIG 105 12/18/2022   CHOLHDL 3.0 12/18/2022  Previously on Lipitor 40, now on Zetia due to transaminitis with statins.  - last eye exam was in 07/2021: no DR.  -No numbness but she has tingling in her feet.  She is on Neurontin 300 mg 3 times a day.  Last foot exam -08/06/2022-Dr. Ardelle Anton. 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 She had a left foot ulcer and she ended up having left BKA 10/07/2022.  Adrenal masses.  Reviewed previous imaging reports: 08/23/2008: MRI of the abdomen with and without contrast: Left adrenal mass measuring 1.6 x 1.5 cm (previously measuring 1.4 x 1.6 cm on the CT scan from 2003 -stable in size),  Of note, she has an enlarging pancreatic head mass - pt reports a h/o pancreatitis.   10/10/2015: CT abdomen and pelvis without contrast: Stable 2.2 cm left adrenal nodule with Hounsfield unit measurements 28 unchanged likely a lipid poor adenoma  05/28/2017: MRI abd: Bilateral adrenal nodules with signal dropout on out of phase imaging, consistent with adenomas.   Example at 2.5 cm on the left and on the order of 1.6 cm on the right.  04/22/2018: MRI abd.: Stable 19 mm nodule in the left adrenal gland, unchanged since 2015, 19 mm otherwise negative.  Reviewed previous adrenal biochemical investigation: Dexamethasone suppression test showed a low normal, nonsuppressed cortisol, but I suspect that this is due to her end-stage renal disease and dialysis status, rather than increased production. Component     Latest Ref Rng  & Units 08/31/2018  Cortisol - AM     mcg/dL 6.1  Dexamethasone, Serum     ng/dL 098   A 11-BJYN urine cortisol could not be done - on dialysis.  We checked a late-night salivary cortisol as this was elevated: Component     Latest Ref Rng & Units 12/26/2020 Draw date/time: 12/22/20  midnight   Salivary Cortisol Baseline     ug/dL 8.295  Salivary Cortisol 2nd Specimen     ug/dL Reference Range:  Children and Adults:  8:00a.m.:   0.025 - 0.600  Noon:      <0.010 - 0.330  4:00p.m.:   0.010 - 0.200  Midnight:  <0.010 - 0.090  0.634   Previous investigation was negative for hyperaldosteronism or pheochromocytoma: Component     Latest Ref Rng 10/28/2021  Potassium     3.5 - 5.1 mEq/L 3.7   Metanephrine, Pl     <=57 pg/mL <25   Normetanephrine, Pl     <=148 pg/mL 74   Total Metanephrines-Plasma     <=205 pg/mL 74   Epinephrine     pg/mL 31   Norepinephrine     pg/mL 736   Dopamine     pg/mL <10   ALDOSTERONE CANCELED   Renin Activity     0.25 - 5.82 ng/mL/h 0.46   ALDO / PRA Ratio CANCELED   HbA1c, POC (controlled diabetic range)     0.0 - 7.0 %   Total Catecholamines     pg/mL 767    Component     Latest Ref Rng & Units 11/30/2017 10/04/2018 11/21/2020  Epinephrine     pg/mL Undetectable 24 <20  Norepinephrine     pg/mL 99 167 (L) 332  Dopamine     pg/mL Undetectable <10 <10  Catecholamines, Total     pg/mL 99    Total Catecholamines     pg/mL  191 (L) 332  Metanephrine, Pl     <=57 pg/mL <25 <25 <25  Normetanephrine, Pl     <=148 pg/mL 71 38 56  Total Metanephrines-Plasma     <=205 pg/mL 71 38 56  ALDOSTERONE      ng/dL 3 2 <1  Renin Activity     0.25 - 5.82 ng/mL/h 1.20 1.48 0.28  ALDO / PRA Ratio      2.5 1.4 see note  Potassium     3.5 - 5.1 mEq/L 4.0 4.9 4.1   Since last visit, we tried to recheck her salivary cortisol but this was canceled by the lab.  After this, we rechecked a cortisol and ACTH and they returned elevated, with a normal  DHEA-S:  Component     Latest Ref Rng 07/22/2021  Salivary Cortisol Baseline     ug/dL CANCELED    Cortisol, Plasma ug/dL 27.2   Comment: AM:  4.3 - 22.4 ug/dLPM:  3.1 - 16.7 ug/dL  Resulting Agency  [facility]     C206 ACTH 6 - 50 pg/mL 85 High    Comment: .  Reference range applies only to specimens collected  between 7am-10am.  .   Resulting Agency  [facility]   DHEA-Sulfate, LCMS ug/dL 91   Comment: This test was developed and its performance characteristics  determined by [facility]. It has not been cleared or approved  by the Food and Drug Administration.  Reference Range:  Adult Females (61 - 70y): <128   Resulting Agency  [facility]   At that point, I referred the patient for a pituitary MRI:  Pituitary MRI (10/02/2021): showed a microadenoma: Brain: Limited by motion degradation which is pervasive. Small pituitary gland with partially empty sella. There is a hypoenhancing nodule in the right aspect of the pituitary/sella measuring up to 3 mm on coronal postcontrast imaging, see series 26. Increased thickness along the right dorsum sella is ossification by 2019 CT. No infundibular or cavernous sinus disease.   IMPRESSION: 1. Possible 3  mm pituitary adenoma on the right. Significant motion artifact. 2. Mild chronic small vessel disease.  At our visit from 10/2021, pituitary labs were at goal (with the exception of a slightly low free T3): Component     Latest Ref Rng 10/28/2021  IGF-I, LC/MS     41 - 279 ng/mL 141   Z-Score (Female)     -2.0 - 2.0 SD 0.4   Triiodothyronine,Free,Serum     2.3 - 4.2 pg/mL 2.1 (L)   T4,Free(Direct)     0.60 - 1.60 ng/dL 1.61   Growth Hormone     < OR = 7.1 ng/mL 0.1   Prolactin     ng/mL 11.4   TSH     0.35 - 5.50 uIU/mL 0.91     Due to the possibility of Cushing's disease, I referred her to [facility].  She did not have this appointment yet, as it was rescheduled.  She also has HTN. She  has been in the hospital repeatedly in the past for pancreatitis. She also has a history of MGUS, increased ferritin, osteoporosis.  ROS: + See HPI  I reviewed pt's medications, allergies, PMH, social hx, family hx, and changes were documented in the history of present illness. Otherwise, unchanged from my initial visit note.  Past Medical History:  Diagnosis Date   Acid reflux    Amputated left leg (HCC)    Anemia    Arthritis    Axillary masses    Soft tissue - status post excision   Back pain    Cancer (HCC)    CHF (congestive heart failure) (HCC)    COVID-19 virus infection 04/06/2019   Depression    End-stage renal disease (HCC)    M/W/F dialysis   Essential hypertension    Headache    years ago   History of blood transfusion    History of cardiac catheterization    Normal coronary arteries October 2020   History of claustrophobia    History of pneumonia 2019   Hypoxia 04/03/2019   Memory loss    Mixed hyperlipidemia    Obesity    Pancreatitis    Peritoneal dialysis catheter in place [facility])    Pneumonia due to COVID-19 virus 04/02/2019   Sleep apnea    Noncompliant with CPAP   Stroke (HCC)    mini stroke   Type 2 diabetes mellitus (HCC)    Past Surgical History:  Procedure Laterality Date   ABDOMINAL AORTOGRAM W/LOWER EXTREMITY N/A 04/30/2022   Procedure: ABDOMINAL AORTOGRAM W/LOWER EXTREMITY;  Surgeon: Leonie Douglas, MD;  Location: [facility];  Service: Cardiovascular;  Laterality: N/A;   ABDOMINAL AORTOGRAM W/LOWER EXTREMITY N/A 07/21/2022   Procedure: ABDOMINAL AORTOGRAM W/LOWER EXTREMITY;  Surgeon: Nada Libman, MD;  Location: [facility];  Service: Cardiovascular;  Laterality: N/A;   ABDOMINAL HYSTERECTOMY     ACHILLES TENDON LENGTHENING  08/15/2022   Procedure: ACHILLES TENDON LENGTHENING;  Surgeon: [name], DPM;  Location: [facility] OR;  Service: Podiatry;;   AMPUTATION Right 05/29/2022   Procedure: RIGHT BELOW THE KNEE AMPUTATION;   Surgeon: [name], MD;  Location: [facility] OR;  Service: Orthopedics;  Laterality: Right;   AMPUTATION Left 09/04/2022   Procedure: AMPUTATION FOOT, serial irrigation;  Surgeon: Louann Sjogren, DPM;  Location: [facility] OR;  Service: Podiatry;  Laterality: Left;  Surgical team to do block   AMPUTATION Left 10/07/2022   Procedure: LEFT BELOW KNEE AMPUTATION;  Surgeon: [name], MD;  Location: [facility] OR;  Service: Orthopedics;  Laterality: Left;   AV FISTULA PLACEMENT Left 09/02/2017   Procedure: creation of left arm ARTERIOVENOUS (AV) FISTULA;  Surgeon: Nada Libman, MD;  Location: [facility] OR;  Service: Vascular;  Laterality: Left;   COLONOSCOPY  2008   Dr. Darrick Penna: normal    COLONOSCOPY N/A 12/18/2016   Dr. Darrick Penna: multiple tubular adenomas, internal hemorrhoids. Surveillance in 3 years    ESOPHAGEAL DILATION N/A 10/13/2015   Procedure: ESOPHAGEAL DILATION;  Surgeon: Malissa Hippo, MD;  Location: [facility];  Service: Endoscopy;  Laterality: N/A;   ESOPHAGOGASTRODUODENOSCOPY N/A 10/13/2015   Dr. Karilyn Cota: chronic gastritis on path, no H.pylori. Empiric dilation    ESOPHAGOGASTRODUODENOSCOPY N/A 12/18/2016   Dr. Darrick Penna: mild gastritis. BRAVO study revealed uncontrolled GERD. Dysphagia secondary to uncontrolled reflux   FOOT SURGERY Bilateral    "nerve"     LEFT HEART CATH AND CORONARY ANGIOGRAPHY N/A 12/29/2018   Procedure: LEFT HEART CATH AND CORONARY ANGIOGRAPHY;  Surgeon: Corky Crafts, MD;  Location: [facility] INVASIVE CV LAB;  Service: Cardiovascular;  Laterality: N/A;   LOWER EXTREMITY ANGIOGRAPHY Right 05/04/2022   Procedure: Lower Extremity Angiography;  Surgeon: Victorino Sparrow, MD;  Location: [facility] INVASIVE CV LAB;  Service: Cardiovascular;  Laterality: Right;   LUNG BIOPSY     MASS EXCISION Right 01/09/2013   Procedure: EXCISION OF NEOPLASM OF RIGHT  AXILLA  AND EXCISION OF NEOPLASM OF LEFT AXILLA;  Surgeon: Dalia Heading, MD;  Location: [facility] ORS;  Service: General;  Laterality: Right;  procedure  end @ 08:23   MYRINGOTOMY WITH TUBE PLACEMENT Bilateral 04/28/2017   Procedure: BILATERAL MYRINGOTOMY WITH TUBE PLACEMENT;  Surgeon: Newman Pies, MD;  Location: [facility] OR;  Service: ENT;  Laterality: Bilateral;   PERIPHERAL VASCULAR BALLOON ANGIOPLASTY Right 05/04/2022   Procedure: PERIPHERAL VASCULAR BALLOON ANGIOPLASTY;  Surgeon: Victorino Sparrow, MD;  Location: [facility] INVASIVE CV LAB;  Service: Cardiovascular;  Laterality: Right;  PT   PERIPHERAL VASCULAR INTERVENTION Right 05/04/2022   Procedure: PERIPHERAL VASCULAR INTERVENTION;  Surgeon: Victorino Sparrow, MD;  Location: [facility] INVASIVE CV LAB;  Service: Cardiovascular;  Laterality: Right;  SFA   PERIPHERAL VASCULAR INTERVENTION Left 07/21/2022   Procedure: PERIPHERAL VASCULAR INTERVENTION;  Surgeon: Nada Libman, MD;  [facility]: [facility];  Service: Cardiovascular;  Laterality: Left;   REVISION OF ARTERIOVENOUS GORETEX GRAFT Left 05/04/2018   Procedure: TRANSPOSITION OF CEPHALIC VEIN ARTERIOVENOUS FISTULA LEFT ARM;  Surgeon: Larina Earthly, MD;  Location: [facility] OR;  Service: Vascular;  Laterality: Left;   SAVORY DILATION N/A 12/18/2016   Procedure: SAVORY DILATION;  Surgeon: West Bali, MD;  [facility]: [facility];  Service: Endoscopy;  Laterality: N/A;   TRANSMETATARSAL AMPUTATION Left 08/15/2022   Procedure: TRANSMETATARSAL AMPUTATION;  Surgeon: [name], DPM;  Location: [facility] OR;  Service: Podiatry;  Laterality: Left;   Social History   Socioeconomic History   Marital status: Married    Spouse name: Not on file   Number of children: 2  Occupational History   CNA  Tobacco Use   Smoking status: Never Smoker   Smokeless tobacco: Never Used  Substance and Sexual Activity   Alcohol use: No   Drug use: No   Current Outpatient Medications on File Prior to Visit  Medication Sig Dispense Refill   amLODipine (NORVASC) 10 MG tablet Take 1 tablet (10 mg total) by mouth daily. 90 tablet 1   aspirin 81 MG chewable tablet Chew 2 tablets (162  mg total) by mouth daily.     B Complex-C-Folic Acid (RENA-VITE RX) 1 MG TABS Take 1 tablet by mouth daily.     Blood Glucose Monitoring Suppl (ONETOUCH VERIO) w/Device KIT Use to check blood sugar 4X daily. 1 kit 0   calcium acetate (PHOSLO) 667 MG tablet Take 667 mg by mouth 3 (three) times daily.     cinacalcet (SENSIPAR) 30 MG tablet Take 3 tablets (90 mg total) by mouth 3 (three) times a week. 60 tablet    clopidogrel (PLAVIX) 75 MG tablet Take 1 tablet (75 mg total) by mouth daily. 30 tablet 11   Continuous Blood Gluc Receiver (FREESTYLE LIBRE 2 READER) DEVI 1 each by Does not apply route daily. 1 each 3   Continuous Blood Gluc Sensor (FREESTYLE LIBRE 2 SENSOR) MISC 1 each by Does not apply route every 14 (fourteen) days. 6 each 3   DROPLET PEN NEEDLES 32G X 4 MM MISC USE FOUR TIMES DAILY 400 each 3   DULoxetine (CYMBALTA) 60 MG capsule TAKE (1) CAPSULE BY MOUTH ONCE DAILY. 28 capsule 11   ezetimibe (ZETIA) 10 MG tablet Take 1 tablet (10 mg total) by mouth daily. 28 tablet 11   FIASP FLEXTOUCH 100 UNIT/ML FlexTouch Pen INJECT 18 TO 30 UNITS INTO THE SKIN WITH BREAKFAST, WITH LUNCH, AND WITH EVENING MEAL (Patient taking differently: Inject 1-10 Units into the skin 3 (three) times daily. Sliding scale) 90 mL 3   glucose blood (ONETOUCH VERIO) test strip Use as instructed to check blood sugar 4X daily. 200 each 2   insulin glargine, 2 Unit Dial, (TOUJEO MAX SOLOSTAR) 300 UNIT/ML Solostar Pen Inject 30 Units into the skin daily. May need to slowly increase the dose depending upon your blood sugar, follow-up with PCP 24 mL 1   metoprolol succinate (TOPROL-XL) 50 MG 24 hr tablet TAKE (1) TABLET BY MOUTH DAILY WITH FOOD *TAKE AFTER DIALYSIS* 28 tablet 11   OneTouch Delica Lancets 33G MISC Use to check blood sugar 4X daily. 100 each 2   torsemide (DEMADEX) 100 MG tablet Take 100 mg by mouth See admin instructions. Take in Tues,Thursday,Sat & Sun     [name] 500 MG chewable tablet Chew 1-2 tablets  (500-1,000 mg total) by mouth See admin instructions. Take 1000mg  (2 tablets) by mouth with meals and 500mg  (1 tablet) with snacks 90 tablet 0   [DISCONTINUED] FLUoxetine (PROZAC) 10 MG capsule Take 10 mg by mouth daily.       [DISCONTINUED] glipiZIDE (GLUCOTROL) 10 MG tablet Take 10 mg by mouth 2 (two) times daily before a meal.       Current Facility-Administered Medications on File Prior to Visit  Medication Dose Route Frequency Provider Last Rate Last Admin   0.9 %  sodium chloride infusion   Intravenous Continuous Lockamy, Randi L, NP-C   Stopped at 10/07/22 1422   Allergies  Allergen Reactions   Ace Inhibitors Anaphylaxis and Swelling   Penicillins Itching and Swelling   Statins Other (See Comments)    elevated LFT's     Albuterol Swelling   Family History  Problem Relation Age of Onset   Hypertension Father    Hypercholesterolemia Father    Arthritis Father    Hypertension Sister    Hypercholesterolemia Sister    Breast cancer Sister    Hypertension Sister    Colon cancer Neg Hx    Colon polyps Neg Hx    PE: BP 120/70   Pulse 73   SpO2 97%  Pt in wheelchair - could not be weighed Wt Readings from Last 3 Encounters:  04/08/23 172 lb (78 kg)  12/31/22 176 lb (79.8 kg)  12/22/22 176 lb 5.9 oz (80 kg)   Constitutional: overweight, in NAD, moon facies, truncal obesity Eyes: EOMI, no exophthalmos ENT: no thyromegaly, no cervical lymphadenopathy Cardiovascular: RRR, No RG, +1/6 SEM Respiratory: CTA B Musculoskeletal: + B BKA Skin: No rashes Neurological: + Intermittent flapping tremor with outstretched hands  ASSESSMENT: 1. DM2, insulin-dependent, uncontrolled, with complications - CKD stage 4 - PN  2. HL  3. B adrenal adenomas  4.  Hypercortisolism  5. Pituitary microadenoma  PLAN:  1.  Complex patient with longstanding, uncontrolled, type 2 diabetes, previously on a premixed insulin regimen but at last visit on the basal-bolus insulin regimen with  improved control.  At last visit, HbA1c was 7.7%, slightly higher than before.  At that time, sugars were quite fluctuating, better in the morning but increasing throughout the day up to 400s.  She was not taking mealtime Fiasp and I advised her about adding this.  She had a low blood sugar in dialysis but it was unclear what circumstances could have caused it.  We discussed about holding Fiasp before breakfast in the days that she had dialysis (MWF at 6:30 AM). -At today's visit, daughter tells me that they did not obtain her CGM yet, but plans to get this.  A new prescription was sent to the pharmacy 3 days ago. -Per their report, sugars are mostly at goal in the morning but they increase throughout the day, in a stepwise fashion.  After dinner blood sugars are usually better than before dinner.  Upon questioning, patient is not always getting the Fiasp insulin with meals as she may have very small meals for example yogurt or fruit cup.  I discussed with her daughter that the [place] dosing should not be all or nothing, for smaller meals, she may need a smaller amount of insulin.  I advised him to use 5 units for snacks or small meals.  Otherwise, we can continue the current regimen.  They will start the CGM which should help with blood sugar improvement. - I suggested to:  Patient Instructions  Please continue: - Toujeo 30 units in the evening - Fiasp:  - 10-20 units before meals (may skip Fiasp with b'fast before dialysis) For a small meal/snack, please take 5 units of Fiasp Use the following FiAsp Sliding scale: - 150-175: + 1 unit  - 176-200: + 2 units  - 201-225: + 3 units  - 226-250: + 4 units  - 251-275: + 5 units - 276-300: + 6 units   Try to start the sensor.  Please return in 3-4 months.      - we checked her HbA1c: 8.4% (higher) - advised to check sugars at different times of the day - 4x a day, rotating check times - advised for yearly eye exams >> she is not UTD - return to  clinic in 3-4 months  2. HL -Latest lipid panel was reviewed from 11/2022: Fractions at goal: Lab Results  Component Value Date   CHOL 123 12/18/2022   HDL 41 12/18/2022   LDLCALC 61 12/18/2022   LDLDIRECT 120.0 11/21/2020   TRIG 105 12/18/2022   CHOLHDL 3.0 12/18/2022  -She could not use statins due to transaminitis.  She is on Zetia 10 mg daily, tolerating well.  3.  Bilateral adrenal adenoma -Patient with history of bilateral adrenal adenomas per review  of her abdominal MRI from 2019.  Imaging characteristics pointed towards benign tumors, of 2.5 and 1.6 cm, respectively.  However, they have appeared to increase over time.  Reviewing the MRI of the abdomen from 2010 and the CT of the abdomen and pelvis from 2017, only 1 nodule was seen in both, in the left adrenal.  She had another MRI in 03/2018 which showed a stable left 1.9 cm adrenal nodule. -We investigated her adrenal status.  Plasma catecholamines, metanephrines, and aldosterone  were normal between 2019-2022.  Norepinephrine was slightly elevated (nonspecific) 09/2018.  Plasma catecholamines, metanephrines, aldosterone, and renin activity were all normal. -Investigation of her cortisol status is difficult due to being on dialysis.  These patients usually have higher cortisol levels due to increased inflammation but not necessarily increased cortisol production.  However, repeated investigation for this returned positive. -These patients usually have higher cortisol level due to increased inflammation but not necessarily increased cortisol production.  However, repeated investigation for this returned positive.   4.  Hypercortisolism -Investigation for autonomous cortisol production was positive: She had a nonsuppressible cortisol level by dexamethasone suppression test, high salivary cortisol and an elevated serum cortisol along with an elevated ACTH level and normal DHEA-S -These results pointed towards a central etiology for her  hypercortisolism, therefore, we checked a pituitary MRI which was positive for a pituitary microadenoma -Therefore, it is possible that patient may have a non-hormone producing adenoma or Cushing's disease.  To determine between the 2, she may need to have an inferior petrosal sinus sampling test, which is not available here.  I referred her to [facility].  She did not have this appointment yet.  5.  Pituitary microadenoma -Please see above -After ACTH and cortisol returned elevated, we checked a pituitary MRI (10/02/2021).  This revealed a possible 3 mm pituitary mass. -We checked her pituitary hormones and they were normal with exception of a slightly low free T3 -It is possible that the adenoma is the source of her cortisol production, however, she would need petrosal sinus sampling before possible surgery.  She is waiting for an appointment at [facility]. 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 -At today's visit we discussed about repeating her pituitary MRI.  She had a pituitary MRI in the hospital but this was degraded by motion artifact and it was not with pituitary protocol.  Plan to check a pituitary MRI at next visit.  Carlus Pavlov, MD PhD [facility] Endocrinology

## 2023-04-21 DIAGNOSIS — Z992 Dependence on renal dialysis: Secondary | ICD-10-CM | POA: Diagnosis not present

## 2023-04-21 DIAGNOSIS — N2581 Secondary hyperparathyroidism of renal origin: Secondary | ICD-10-CM | POA: Diagnosis not present

## 2023-04-21 DIAGNOSIS — N186 End stage renal disease: Secondary | ICD-10-CM | POA: Diagnosis not present

## 2023-04-21 DIAGNOSIS — D631 Anemia in chronic kidney disease: Secondary | ICD-10-CM | POA: Diagnosis not present

## 2023-04-21 DIAGNOSIS — N250 Renal osteodystrophy: Secondary | ICD-10-CM | POA: Diagnosis not present

## 2023-04-21 DIAGNOSIS — D509 Iron deficiency anemia, unspecified: Secondary | ICD-10-CM | POA: Diagnosis not present

## 2023-04-22 ENCOUNTER — Telehealth: Payer: Self-pay

## 2023-04-22 ENCOUNTER — Ambulatory Visit: Payer: Medicare PPO | Admitting: Family Medicine

## 2023-04-22 MED ORDER — AZITHROMYCIN 250 MG PO TABS: ORAL_TABLET | ORAL | 0 refills | Status: AC

## 2023-04-22 MED ORDER — BENZONATATE 100 MG PO CAPS: ORAL_CAPSULE | ORAL | 0 refills | Status: DC

## 2023-04-22 NOTE — Telephone Encounter (Signed)
Copied from CRM [id number]. Topic: General - Other >> Apr 22, 2023 11:24 AM Maxwell Marion wrote: Reason for CRM: Sarah with [facility] called to let Dr. Lodema Hong know she just saw the patient and patient is having a productive cough with yellow/green sputum and a sore throat. She did hear faint rhonchi, concerned it could be pneumonia. Oxygen is fine on room air and no fever. Call back number is [phone].

## 2023-04-22 NOTE — Addendum Note (Signed)
Addended by: [name] on: 04/22/2023 04:22 PM   Modules accepted: Orders

## 2023-04-22 NOTE — Telephone Encounter (Signed)
Spoke with patients daughter she is aware of below and will schedule follow up thru patients mychart

## 2023-04-23 DIAGNOSIS — N186 End stage renal disease: Secondary | ICD-10-CM | POA: Diagnosis not present

## 2023-04-23 DIAGNOSIS — N250 Renal osteodystrophy: Secondary | ICD-10-CM | POA: Diagnosis not present

## 2023-04-23 DIAGNOSIS — D509 Iron deficiency anemia, unspecified: Secondary | ICD-10-CM | POA: Diagnosis not present

## 2023-04-23 DIAGNOSIS — D631 Anemia in chronic kidney disease: Secondary | ICD-10-CM | POA: Diagnosis not present

## 2023-04-23 DIAGNOSIS — Z992 Dependence on renal dialysis: Secondary | ICD-10-CM | POA: Diagnosis not present

## 2023-04-23 DIAGNOSIS — N2581 Secondary hyperparathyroidism of renal origin: Secondary | ICD-10-CM | POA: Diagnosis not present

## 2023-04-24 DIAGNOSIS — N2581 Secondary hyperparathyroidism of renal origin: Secondary | ICD-10-CM | POA: Diagnosis not present

## 2023-04-24 DIAGNOSIS — Z992 Dependence on renal dialysis: Secondary | ICD-10-CM | POA: Diagnosis not present

## 2023-04-24 DIAGNOSIS — N186 End stage renal disease: Secondary | ICD-10-CM | POA: Diagnosis not present

## 2023-04-24 DIAGNOSIS — D509 Iron deficiency anemia, unspecified: Secondary | ICD-10-CM | POA: Diagnosis not present

## 2023-04-24 DIAGNOSIS — D631 Anemia in chronic kidney disease: Secondary | ICD-10-CM | POA: Diagnosis not present

## 2023-04-24 DIAGNOSIS — N250 Renal osteodystrophy: Secondary | ICD-10-CM | POA: Diagnosis not present

## 2023-04-26 ENCOUNTER — Encounter: Payer: Self-pay | Admitting: Family Medicine

## 2023-04-26 DIAGNOSIS — N250 Renal osteodystrophy: Secondary | ICD-10-CM | POA: Diagnosis not present

## 2023-04-26 DIAGNOSIS — N186 End stage renal disease: Secondary | ICD-10-CM | POA: Diagnosis not present

## 2023-04-26 DIAGNOSIS — Z992 Dependence on renal dialysis: Secondary | ICD-10-CM | POA: Diagnosis not present

## 2023-04-26 DIAGNOSIS — D509 Iron deficiency anemia, unspecified: Secondary | ICD-10-CM | POA: Diagnosis not present

## 2023-04-26 DIAGNOSIS — N2581 Secondary hyperparathyroidism of renal origin: Secondary | ICD-10-CM | POA: Diagnosis not present

## 2023-04-26 DIAGNOSIS — D631 Anemia in chronic kidney disease: Secondary | ICD-10-CM | POA: Diagnosis not present

## 2023-04-27 ENCOUNTER — Other Ambulatory Visit: Payer: Self-pay

## 2023-04-27 ENCOUNTER — Ambulatory Visit (INDEPENDENT_AMBULATORY_CARE_PROVIDER_SITE_OTHER): Payer: Medicare Other | Admitting: Family Medicine

## 2023-04-27 ENCOUNTER — Ambulatory Visit (HOSPITAL_COMMUNITY)
Admission: RE | Admit: 2023-04-27 | Discharge: 2023-04-27 | Disposition: A | Discharge status: Home / Self Care | Payer: Medicare Other | Source: Ambulatory Visit | Attending: Family Medicine | Admitting: Family Medicine

## 2023-04-27 VITALS — BP 158/82 | HR 94

## 2023-04-27 DIAGNOSIS — R197 Diarrhea, unspecified: Secondary | ICD-10-CM | POA: Diagnosis not present

## 2023-04-27 DIAGNOSIS — E1122 Type 2 diabetes mellitus with diabetic chronic kidney disease: Secondary | ICD-10-CM

## 2023-04-27 DIAGNOSIS — Z992 Dependence on renal dialysis: Secondary | ICD-10-CM | POA: Diagnosis not present

## 2023-04-27 DIAGNOSIS — I10 Essential (primary) hypertension: Secondary | ICD-10-CM | POA: Diagnosis not present

## 2023-04-27 DIAGNOSIS — Z794 Long term (current) use of insulin: Secondary | ICD-10-CM

## 2023-04-27 DIAGNOSIS — H900 Conductive hearing loss, bilateral: Secondary | ICD-10-CM

## 2023-04-27 DIAGNOSIS — N186 End stage renal disease: Secondary | ICD-10-CM | POA: Diagnosis not present

## 2023-04-27 DIAGNOSIS — R918 Other nonspecific abnormal finding of lung field: Secondary | ICD-10-CM | POA: Diagnosis not present

## 2023-04-27 DIAGNOSIS — J40 Bronchitis, not specified as acute or chronic: Secondary | ICD-10-CM

## 2023-04-27 DIAGNOSIS — R0989 Other specified symptoms and signs involving the circulatory and respiratory systems: Secondary | ICD-10-CM | POA: Diagnosis not present

## 2023-04-27 DIAGNOSIS — R059 Cough, unspecified: Secondary | ICD-10-CM | POA: Diagnosis not present

## 2023-04-27 DIAGNOSIS — K580 Irritable bowel syndrome with diarrhea: Secondary | ICD-10-CM

## 2023-04-27 DIAGNOSIS — H9203 Otalgia, bilateral: Secondary | ICD-10-CM

## 2023-04-27 DIAGNOSIS — H9190 Unspecified hearing loss, unspecified ear: Secondary | ICD-10-CM | POA: Insufficient documentation

## 2023-04-27 MED ORDER — EZETIMIBE 10 MG PO TABS: 10.0000 mg | ORAL_TABLET | Freq: Every day | ORAL | 11 refills | Status: DC

## 2023-04-27 MED ORDER — DOXYCYCLINE HYCLATE 100 MG PO TABS: 100.0000 mg | ORAL_TABLET | Freq: Two times a day (BID) | ORAL | 0 refills | Status: DC

## 2023-04-27 NOTE — Patient Instructions (Addendum)
 F/U in 4 months, call if you need me sooner  CXR today when you leave here  Sputum to be sent for c/s   Doxycycline , an antibiotic is prescribed for 1 week  With recent diarrhea, need to submit stool for c/s and c diff ( specimen container to be taken home and returned to lab if unable at time of visit)  You are referred to ENT for hearing loss and discomfort in ears  Thanks for choosing [facility], we consider it a privelige to serve you.

## 2023-04-28 ENCOUNTER — Ambulatory Visit: Payer: Medicare Other | Admitting: Family

## 2023-04-28 DIAGNOSIS — D509 Iron deficiency anemia, unspecified: Secondary | ICD-10-CM | POA: Diagnosis not present

## 2023-04-28 DIAGNOSIS — N186 End stage renal disease: Secondary | ICD-10-CM | POA: Diagnosis not present

## 2023-04-28 DIAGNOSIS — D631 Anemia in chronic kidney disease: Secondary | ICD-10-CM | POA: Diagnosis not present

## 2023-04-28 DIAGNOSIS — Z992 Dependence on renal dialysis: Secondary | ICD-10-CM | POA: Diagnosis not present

## 2023-04-28 DIAGNOSIS — N2581 Secondary hyperparathyroidism of renal origin: Secondary | ICD-10-CM | POA: Diagnosis not present

## 2023-04-28 DIAGNOSIS — N250 Renal osteodystrophy: Secondary | ICD-10-CM | POA: Diagnosis not present

## 2023-04-30 ENCOUNTER — Inpatient Hospital Stay (HOSPITAL_COMMUNITY)
Admission: EM | Admit: 2023-04-30 | Discharge: 2023-05-02 | DRG: 193 | Disposition: A | Discharge status: Home / Self Care | Payer: Medicare Other | Attending: Family Medicine | Admitting: Family Medicine

## 2023-04-30 ENCOUNTER — Emergency Department (HOSPITAL_COMMUNITY): Payer: Medicare Other

## 2023-04-30 ENCOUNTER — Encounter (HOSPITAL_COMMUNITY): Payer: Self-pay

## 2023-04-30 ENCOUNTER — Other Ambulatory Visit: Payer: Self-pay

## 2023-04-30 DIAGNOSIS — E1165 Type 2 diabetes mellitus with hyperglycemia: Secondary | ICD-10-CM | POA: Diagnosis present

## 2023-04-30 DIAGNOSIS — Z79899 Other long term (current) drug therapy: Secondary | ICD-10-CM

## 2023-04-30 DIAGNOSIS — Z89512 Acquired absence of left leg below knee: Secondary | ICD-10-CM

## 2023-04-30 DIAGNOSIS — E782 Mixed hyperlipidemia: Secondary | ICD-10-CM | POA: Diagnosis present

## 2023-04-30 DIAGNOSIS — Z83438 Family history of other disorder of lipoprotein metabolism and other lipidemia: Secondary | ICD-10-CM

## 2023-04-30 DIAGNOSIS — Z8673 Personal history of transient ischemic attack (TIA), and cerebral infarction without residual deficits: Secondary | ICD-10-CM

## 2023-04-30 DIAGNOSIS — I132 Hypertensive heart and chronic kidney disease with heart failure and with stage 5 chronic kidney disease, or end stage renal disease: Secondary | ICD-10-CM | POA: Diagnosis not present

## 2023-04-30 DIAGNOSIS — K580 Irritable bowel syndrome with diarrhea: Secondary | ICD-10-CM | POA: Diagnosis not present

## 2023-04-30 DIAGNOSIS — E669 Obesity, unspecified: Secondary | ICD-10-CM | POA: Diagnosis present

## 2023-04-30 DIAGNOSIS — Z992 Dependence on renal dialysis: Secondary | ICD-10-CM | POA: Diagnosis not present

## 2023-04-30 DIAGNOSIS — J159 Unspecified bacterial pneumonia: Secondary | ICD-10-CM | POA: Diagnosis not present

## 2023-04-30 DIAGNOSIS — I10 Essential (primary) hypertension: Secondary | ICD-10-CM | POA: Diagnosis not present

## 2023-04-30 DIAGNOSIS — J9601 Acute respiratory failure with hypoxia: Secondary | ICD-10-CM | POA: Diagnosis present

## 2023-04-30 DIAGNOSIS — I5032 Chronic diastolic (congestive) heart failure: Secondary | ICD-10-CM | POA: Diagnosis present

## 2023-04-30 DIAGNOSIS — N186 End stage renal disease: Secondary | ICD-10-CM | POA: Diagnosis not present

## 2023-04-30 DIAGNOSIS — J210 Acute bronchiolitis due to respiratory syncytial virus: Secondary | ICD-10-CM | POA: Diagnosis not present

## 2023-04-30 DIAGNOSIS — Z7902 Long term (current) use of antithrombotics/antiplatelets: Secondary | ICD-10-CM

## 2023-04-30 DIAGNOSIS — Z90710 Acquired absence of both cervix and uterus: Secondary | ICD-10-CM

## 2023-04-30 DIAGNOSIS — Z8249 Family history of ischemic heart disease and other diseases of the circulatory system: Secondary | ICD-10-CM

## 2023-04-30 DIAGNOSIS — J40 Bronchitis, not specified as acute or chronic: Secondary | ICD-10-CM | POA: Diagnosis not present

## 2023-04-30 DIAGNOSIS — R0902 Hypoxemia: Secondary | ICD-10-CM | POA: Diagnosis not present

## 2023-04-30 DIAGNOSIS — E119 Type 2 diabetes mellitus without complications: Secondary | ICD-10-CM

## 2023-04-30 DIAGNOSIS — Z880 Allergy status to penicillin: Secondary | ICD-10-CM

## 2023-04-30 DIAGNOSIS — Z803 Family history of malignant neoplasm of breast: Secondary | ICD-10-CM

## 2023-04-30 DIAGNOSIS — Z91199 Patient's noncompliance with other medical treatment and regimen due to unspecified reason: Secondary | ICD-10-CM

## 2023-04-30 DIAGNOSIS — E1122 Type 2 diabetes mellitus with diabetic chronic kidney disease: Secondary | ICD-10-CM | POA: Diagnosis present

## 2023-04-30 DIAGNOSIS — E1142 Type 2 diabetes mellitus with diabetic polyneuropathy: Secondary | ICD-10-CM | POA: Diagnosis present

## 2023-04-30 DIAGNOSIS — E876 Hypokalemia: Secondary | ICD-10-CM | POA: Diagnosis present

## 2023-04-30 DIAGNOSIS — R0600 Dyspnea, unspecified: Secondary | ICD-10-CM | POA: Diagnosis not present

## 2023-04-30 DIAGNOSIS — R0602 Shortness of breath: Secondary | ICD-10-CM | POA: Diagnosis not present

## 2023-04-30 DIAGNOSIS — K219 Gastro-esophageal reflux disease without esophagitis: Secondary | ICD-10-CM | POA: Diagnosis present

## 2023-04-30 DIAGNOSIS — I2510 Atherosclerotic heart disease of native coronary artery without angina pectoris: Secondary | ICD-10-CM | POA: Diagnosis present

## 2023-04-30 DIAGNOSIS — R0989 Other specified symptoms and signs involving the circulatory and respiratory systems: Secondary | ICD-10-CM | POA: Diagnosis not present

## 2023-04-30 DIAGNOSIS — F40240 Claustrophobia: Secondary | ICD-10-CM | POA: Diagnosis present

## 2023-04-30 DIAGNOSIS — Z1152 Encounter for screening for COVID-19: Secondary | ICD-10-CM

## 2023-04-30 DIAGNOSIS — Z8616 Personal history of COVID-19: Secondary | ICD-10-CM

## 2023-04-30 DIAGNOSIS — R531 Weakness: Secondary | ICD-10-CM | POA: Diagnosis not present

## 2023-04-30 DIAGNOSIS — R918 Other nonspecific abnormal finding of lung field: Secondary | ICD-10-CM | POA: Diagnosis not present

## 2023-04-30 DIAGNOSIS — Z7984 Long term (current) use of oral hypoglycemic drugs: Secondary | ICD-10-CM

## 2023-04-30 DIAGNOSIS — Z888 Allergy status to other drugs, medicaments and biological substances status: Secondary | ICD-10-CM

## 2023-04-30 DIAGNOSIS — I120 Hypertensive chronic kidney disease with stage 5 chronic kidney disease or end stage renal disease: Secondary | ICD-10-CM | POA: Diagnosis not present

## 2023-04-30 DIAGNOSIS — Z8261 Family history of arthritis: Secondary | ICD-10-CM

## 2023-04-30 DIAGNOSIS — Z7982 Long term (current) use of aspirin: Secondary | ICD-10-CM

## 2023-04-30 DIAGNOSIS — Z794 Long term (current) use of insulin: Secondary | ICD-10-CM

## 2023-04-30 DIAGNOSIS — Z89511 Acquired absence of right leg below knee: Secondary | ICD-10-CM

## 2023-04-30 DIAGNOSIS — B338 Other specified viral diseases: Principal | ICD-10-CM

## 2023-04-30 DIAGNOSIS — Z91158 Patient's noncompliance with renal dialysis for other reason: Secondary | ICD-10-CM

## 2023-04-30 LAB — BASIC METABOLIC PANEL
Anion gap: 16 — ABNORMAL HIGH (ref 5–15)
BUN: 57 mg/dL — ABNORMAL HIGH (ref 8–23)
CO2: 25 mmol/L (ref 22–32)
Calcium: 9.8 mg/dL (ref 8.9–10.3)
Chloride: 102 mmol/L (ref 98–111)
Creatinine, Ser: 6.74 mg/dL — ABNORMAL HIGH (ref 0.44–1.00)
GFR, Estimated: 6 mL/min — ABNORMAL LOW (ref >60)
Glucose, Bld: 175 mg/dL — ABNORMAL HIGH (ref 70–99)
Potassium: 4.3 mmol/L (ref 3.5–5.1)
Sodium: 143 mmol/L (ref 135–145)

## 2023-04-30 LAB — CBC WITH DIFFERENTIAL/PLATELET
Abs Immature Granulocytes: 0.04 10*3/uL (ref 0.00–0.07)
Basophils Absolute: 0 10*3/uL (ref 0.0–0.1)
Basophils Relative: 0 %
Eosinophils Absolute: 0 10*3/uL (ref 0.0–0.5)
Eosinophils Relative: 1 %
HCT: 35.9 % — ABNORMAL LOW (ref 36.0–46.0)
Hemoglobin: 11.2 g/dL — ABNORMAL LOW (ref 12.0–15.0)
Immature Granulocytes: 1 %
Lymphocytes Relative: 22 %
Lymphs Abs: 1.2 10*3/uL (ref 0.7–4.0)
MCH: 25.7 pg — ABNORMAL LOW (ref 26.0–34.0)
MCHC: 31.2 g/dL (ref 30.0–36.0)
MCV: 82.3 fL (ref 80.0–100.0)
Monocytes Absolute: 0.4 10*3/uL (ref 0.1–1.0)
Monocytes Relative: 8 %
Neutro Abs: 3.7 10*3/uL (ref 1.7–7.7)
Neutrophils Relative %: 68 %
Platelets: 162 10*3/uL (ref 150–400)
RBC: 4.36 MIL/uL (ref 3.87–5.11)
RDW: 17.7 % — ABNORMAL HIGH (ref 11.5–15.5)
WBC: 5.4 10*3/uL (ref 4.0–10.5)
nRBC: 0 % (ref 0.0–0.2)

## 2023-04-30 LAB — GLUCOSE, CAPILLARY
Glucose-Capillary: 270 mg/dL — ABNORMAL HIGH (ref 70–99)
Glucose-Capillary: 235 mg/dL — ABNORMAL HIGH (ref 70–99)

## 2023-04-30 LAB — PROCALCITONIN: Procalcitonin: 0.7 ng/mL

## 2023-04-30 LAB — RESP PANEL BY RT-PCR (RSV, FLU A&B, COVID)  RVPGX2
Influenza A by PCR: NEGATIVE
Influenza B by PCR: NEGATIVE
Resp Syncytial Virus by PCR: POSITIVE — AB
SARS Coronavirus 2 by RT PCR: NEGATIVE

## 2023-04-30 LAB — MRSA NEXT GEN BY PCR, NASAL: MRSA by PCR Next Gen: NOT DETECTED

## 2023-04-30 MED ORDER — EZETIMIBE 10 MG PO TABS
10.0000 mg | ORAL_TABLET | Freq: Every day | ORAL | Status: DC
  Administered 2023-05-01 – 2023-05-02 (×2): 10 mg via ORAL
  Filled 2023-04-30 (×2): qty 1

## 2023-04-30 MED ORDER — SUCROFERRIC OXYHYDROXIDE 500 MG PO CHEW
1000.0000 mg | CHEWABLE_TABLET | Freq: Three times a day (TID) | ORAL | Status: DC
  Administered 2023-05-01 – 2023-05-02 (×4): 1000 mg via ORAL
  Filled 2023-04-30 (×7): qty 2

## 2023-04-30 MED ORDER — FLUTICASONE FUROATE-VILANTEROL 200-25 MCG/ACT IN AEPB
1.0000 | INHALATION_SPRAY | Freq: Every day | RESPIRATORY_TRACT | Status: DC
  Administered 2023-05-01 – 2023-05-02 (×2): 1 via RESPIRATORY_TRACT
  Filled 2023-04-30: qty 28

## 2023-04-30 MED ORDER — HEPARIN SODIUM (PORCINE) 1000 UNIT/ML DIALYSIS: 4000.0000 [IU] | INTRAMUSCULAR | Status: DC | PRN

## 2023-04-30 MED ORDER — DULOXETINE HCL 30 MG PO CPEP
60.0000 mg | ORAL_CAPSULE | Freq: Every day | ORAL | Status: DC
  Administered 2023-05-01 – 2023-05-02 (×2): 60 mg via ORAL
  Filled 2023-04-30 (×2): qty 2

## 2023-04-30 MED ORDER — TORSEMIDE 20 MG PO TABS
100.0000 mg | ORAL_TABLET | ORAL | Status: DC
  Administered 2023-05-01 – 2023-05-02 (×2): 100 mg via ORAL
  Filled 2023-04-30 (×2): qty 5

## 2023-04-30 MED ORDER — ACETAMINOPHEN 650 MG RE SUPP: 650.0000 mg | Freq: Four times a day (QID) | RECTAL | Status: DC | PRN

## 2023-04-30 MED ORDER — ACETAMINOPHEN 325 MG PO TABS: 650.0000 mg | ORAL_TABLET | Freq: Four times a day (QID) | ORAL | Status: DC | PRN

## 2023-04-30 MED ORDER — GUAIFENESIN ER 600 MG PO TB12
600.0000 mg | ORAL_TABLET | Freq: Two times a day (BID) | ORAL | Status: DC
  Administered 2023-04-30 – 2023-05-02 (×4): 600 mg via ORAL
  Filled 2023-04-30 (×4): qty 1

## 2023-04-30 MED ORDER — LIDOCAINE-PRILOCAINE 2.5-2.5 % EX CREA: 1.0000 | TOPICAL_CREAM | CUTANEOUS | Status: DC | PRN

## 2023-04-30 MED ORDER — CHLORHEXIDINE GLUCONATE CLOTH 2 % EX PADS
6.0000 | MEDICATED_PAD | Freq: Every day | CUTANEOUS | Status: DC
  Administered 2023-05-01 – 2023-05-02 (×2): 6 via TOPICAL

## 2023-04-30 MED ORDER — CINACALCET HCL 30 MG PO TABS: 90.0000 mg | ORAL_TABLET | ORAL | Status: DC

## 2023-04-30 MED ORDER — INSULIN ASPART 100 UNIT/ML IJ SOLN
0.0000 [IU] | Freq: Every day | INTRAMUSCULAR | Status: DC
  Administered 2023-04-30: 2 [IU] via SUBCUTANEOUS
  Administered 2023-05-01: 5 [IU] via SUBCUTANEOUS

## 2023-04-30 MED ORDER — METOPROLOL SUCCINATE ER 50 MG PO TB24
50.0000 mg | ORAL_TABLET | Freq: Every day | ORAL | Status: DC
  Administered 2023-05-01 – 2023-05-02 (×2): 50 mg via ORAL
  Filled 2023-04-30 (×2): qty 1

## 2023-04-30 MED ORDER — PENTAFLUOROPROP-TETRAFLUOROETH EX AERO: 1.0000 | INHALATION_SPRAY | CUTANEOUS | Status: DC | PRN

## 2023-04-30 MED ORDER — ENOXAPARIN SODIUM 30 MG/0.3ML IJ SOSY
30.0000 mg | PREFILLED_SYRINGE | INTRAMUSCULAR | Status: DC
  Administered 2023-05-01 (×2): 30 mg via SUBCUTANEOUS
  Filled 2023-04-30 (×2): qty 0.3

## 2023-04-30 MED ORDER — SERTRALINE HCL 50 MG PO TABS
50.0000 mg | ORAL_TABLET | Freq: Every day | ORAL | Status: DC
  Administered 2023-05-01 – 2023-05-02 (×2): 50 mg via ORAL
  Filled 2023-04-30 (×2): qty 1

## 2023-04-30 MED ORDER — ONDANSETRON HCL 4 MG/2ML IJ SOLN: 4.0000 mg | Freq: Four times a day (QID) | INTRAMUSCULAR | Status: DC | PRN

## 2023-04-30 MED ORDER — LIDOCAINE HCL (PF) 1 % IJ SOLN: 5.0000 mL | INTRAMUSCULAR | Status: DC | PRN

## 2023-04-30 MED ORDER — BENZONATATE 100 MG PO CAPS
100.0000 mg | ORAL_CAPSULE | Freq: Two times a day (BID) | ORAL | Status: DC
  Administered 2023-04-30 – 2023-05-02 (×4): 100 mg via ORAL
  Filled 2023-04-30 (×4): qty 1

## 2023-04-30 MED ORDER — ONDANSETRON HCL 4 MG PO TABS: 4.0000 mg | ORAL_TABLET | Freq: Four times a day (QID) | ORAL | Status: DC | PRN

## 2023-04-30 MED ORDER — AMLODIPINE BESYLATE 5 MG PO TABS
10.0000 mg | ORAL_TABLET | Freq: Every day | ORAL | Status: DC
  Administered 2023-05-01 – 2023-05-02 (×2): 10 mg via ORAL
  Filled 2023-04-30 (×2): qty 2

## 2023-04-30 MED ORDER — HYDRALAZINE HCL 25 MG PO TABS
25.0000 mg | ORAL_TABLET | Freq: Two times a day (BID) | ORAL | Status: DC
  Administered 2023-05-01 – 2023-05-02 (×3): 25 mg via ORAL
  Filled 2023-04-30 (×3): qty 1

## 2023-04-30 MED ORDER — CLOPIDOGREL BISULFATE 75 MG PO TABS
75.0000 mg | ORAL_TABLET | Freq: Every day | ORAL | Status: DC
  Administered 2023-05-01 – 2023-05-02 (×2): 75 mg via ORAL
  Filled 2023-04-30 (×2): qty 1

## 2023-04-30 MED ORDER — CALCIUM ACETATE (PHOS BINDER) 667 MG PO CAPS
667.0000 mg | ORAL_CAPSULE | Freq: Three times a day (TID) | ORAL | Status: DC
  Administered 2023-05-01 – 2023-05-02 (×5): 667 mg via ORAL
  Filled 2023-04-30 (×5): qty 1

## 2023-04-30 MED ORDER — UMECLIDINIUM BROMIDE 62.5 MCG/ACT IN AEPB
1.0000 | INHALATION_SPRAY | Freq: Every day | RESPIRATORY_TRACT | Status: DC
  Administered 2023-05-01 – 2023-05-02 (×2): 1 via RESPIRATORY_TRACT
  Filled 2023-04-30: qty 7

## 2023-04-30 MED ORDER — INSULIN GLARGINE-YFGN 100 UNIT/ML ~~LOC~~ SOLN
30.0000 [IU] | Freq: Every day | SUBCUTANEOUS | Status: DC
  Administered 2023-05-01: 30 [IU] via SUBCUTANEOUS
  Filled 2023-04-30 (×3): qty 0.3

## 2023-04-30 MED ORDER — INSULIN ASPART 100 UNIT/ML IJ SOLN
0.0000 [IU] | Freq: Three times a day (TID) | INTRAMUSCULAR | Status: DC
  Administered 2023-05-01 – 2023-05-02 (×3): 5 [IU] via SUBCUTANEOUS
  Administered 2023-05-02: 8 [IU] via SUBCUTANEOUS

## 2023-04-30 NOTE — ED Notes (Signed)
 Report given to receiving nurse on [address], LPN no further questions at this time.

## 2023-04-30 NOTE — H&P (Signed)
 History and Physical    Patient: Kathryn Beck Kathryn Beck DOB: [birth year] DOA: 04/30/2023 DOS: the patient was seen and examined on 04/30/2023 PCP: Antonetta Rollene BRAVO, MD  Patient coming from: Home  Chief Complaint:  Chief Complaint  Patient presents with   Fatigue   Shortness of Breath    Pt bib EMS from home for SOB, Pt gets dialysis,  supposed to go today but couldn't due to feeling bad. Endorses nasal congestion and SOB. Recently diagnosed with Pneumonia on the 4th and taking antibiotics. 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 94% on RA on arrival.. Left arm Restricted.   HPI: Kathryn Beck is a 68 y.o. female with medical history significant of end-stage renal disease on dialysis Monday, Wednesday, Friday, heart failure with preserved EF, diabetes, history of bilateral lower EXTR amputations, sleep apnea, obesity.  Patient presents with cough, shortness of breath that has been ongoing over the past couple of days.  She denies fevers, chills, nausea, vomiting.  No known sick contacts.  She saw her primary care physician a few days ago and was prescribed azithromycin , but then switched to doxycycline .  She had no improvements.  She did miss dialysis earlier today.  She does not produce much urine, but still takes torsemide  which she has been compliant with.  Review of Systems: As mentioned in the history of present illness. All other systems reviewed and are negative. Past Medical History:  Diagnosis Date   Acid reflux    Amputated left leg (HCC)    Anemia    Arthritis    Axillary masses    Soft tissue - status post excision   Back pain    Cancer (HCC)    CHF (congestive heart failure) (HCC)    COVID-19 virus infection 04/06/2019   Depression    End-stage renal disease (HCC)    M/W/F dialysis   Essential hypertension    Headache    years ago   History of blood transfusion    History of cardiac catheterization    Normal coronary arteries October 2020   History of  claustrophobia    History of pneumonia 2019   Hypoxia 04/03/2019   Memory loss    Mixed hyperlipidemia    Obesity    Pancreatitis    Peritoneal dialysis catheter in place [facility])    Pneumonia due to COVID-19 virus 04/02/2019   Sleep apnea    Noncompliant with CPAP   Stroke (HCC)    mini stroke   Type 2 diabetes mellitus (HCC)    Past Surgical History:  Procedure Laterality Date   ABDOMINAL AORTOGRAM W/LOWER EXTREMITY N/A 04/30/2022   Procedure: ABDOMINAL AORTOGRAM W/LOWER EXTREMITY;  Surgeon: Magda Debby SAILOR, MD;  Location: [facility] INVASIVE CV LAB;  Service: Cardiovascular;  Laterality: N/A;   ABDOMINAL AORTOGRAM W/LOWER EXTREMITY N/A 07/21/2022   Procedure: ABDOMINAL AORTOGRAM W/LOWER EXTREMITY;  Surgeon: Serene Gaile ORN, MD;  Location: [facility] INVASIVE CV LAB;  Service: Cardiovascular;  Laterality: N/A;   ABDOMINAL HYSTERECTOMY     ACHILLES TENDON LENGTHENING  08/15/2022   Procedure: ACHILLES TENDON LENGTHENING;  Surgeon: Silva Juliene SAUNDERS, DPM;  Location: [facility] OR;  Service: Podiatry;;   AMPUTATION Right 05/29/2022   Procedure: RIGHT BELOW THE KNEE AMPUTATION;  Surgeon: Harden Jerona GAILS, MD;  Location: [facility] OR;  Service: Orthopedics;  Laterality: Right;   AMPUTATION Left 09/04/2022   Procedure: AMPUTATION FOOT, serial irrigation;  Surgeon: Joya Stabs, DPM;  Location: [facility] OR;  Service: Podiatry;  Laterality: Left;  Surgical team to do  block   AMPUTATION Left 10/07/2022   Procedure: LEFT BELOW KNEE AMPUTATION;  Surgeon: Harden Jerona GAILS, MD;  Location: [facility] OR;  Service: Orthopedics;  Laterality: Left;   AV FISTULA PLACEMENT Left 09/02/2017   Procedure: creation of left arm ARTERIOVENOUS (AV) FISTULA;  Surgeon: Serene Gaile ORN, MD;  Location: [facility] OR;  Service: Vascular;  Laterality: Left;   COLONOSCOPY  2008   Dr. Harvey: normal    COLONOSCOPY N/A 12/18/2016   Dr. Harvey: multiple tubular adenomas, internal hemorrhoids. Surveillance in 3 years    ESOPHAGEAL DILATION N/A 10/13/2015   Procedure: ESOPHAGEAL  DILATION;  Surgeon: Claudis RAYMOND Rivet, MD;  Location: [facility] ENDO SUITE;  Service: Endoscopy;  Laterality: N/A;   ESOPHAGOGASTRODUODENOSCOPY N/A 10/13/2015   Dr. Rivet: chronic gastritis on path, no H.pylori. Empiric dilation    ESOPHAGOGASTRODUODENOSCOPY N/A 12/18/2016   Dr. Harvey: mild gastritis. BRAVO study revealed uncontrolled GERD. Dysphagia secondary to uncontrolled reflux   FOOT SURGERY Bilateral    nerve     LEFT HEART CATH AND CORONARY ANGIOGRAPHY N/A 12/29/2018   Procedure: LEFT HEART CATH AND CORONARY ANGIOGRAPHY;  Surgeon: Dann Candyce RAMAN, MD;  Location: [facility] INVASIVE CV LAB;  Service: Cardiovascular;  Laterality: N/A;   LOWER EXTREMITY ANGIOGRAPHY Right 05/04/2022   Procedure: Lower Extremity Angiography;  Surgeon: Lanis Fonda BRAVO, MD;  Location: [facility] INVASIVE CV LAB;  Service: Cardiovascular;  Laterality: Right;   LUNG BIOPSY     MASS EXCISION Right 01/09/2013   Procedure: EXCISION OF NEOPLASM OF RIGHT  AXILLA  AND EXCISION OF NEOPLASM OF LEFT AXILLA;  Surgeon: Oneil DELENA Budge, MD;  Location: [facility] ORS;  Service: General;  Laterality: Right;  procedure end @ 08:23   MYRINGOTOMY WITH TUBE PLACEMENT Bilateral 04/28/2017   Procedure: BILATERAL MYRINGOTOMY WITH TUBE PLACEMENT;  Surgeon: Karis Clunes, MD;  Location: [facility] OR;  Service: ENT;  Laterality: Bilateral;   PERIPHERAL VASCULAR BALLOON ANGIOPLASTY Right 05/04/2022   Procedure: PERIPHERAL VASCULAR BALLOON ANGIOPLASTY;  Surgeon: Lanis Fonda BRAVO, MD;  Location: [facility] INVASIVE CV LAB;  Service: Cardiovascular;  Laterality: Right;  PT   PERIPHERAL VASCULAR INTERVENTION Right 05/04/2022   Procedure: PERIPHERAL VASCULAR INTERVENTION;  Surgeon: Lanis Fonda BRAVO, MD;  Location: [facility] INVASIVE CV LAB;  Service: Cardiovascular;  Laterality: Right;  SFA   PERIPHERAL VASCULAR INTERVENTION Left 07/21/2022   Procedure: PERIPHERAL VASCULAR INTERVENTION;  Surgeon: Serene Gaile ORN, MD;  Location: [facility] INVAS[facility] CV LAB;  Service: Cardiovascular;  Laterality: Left;    REVISION OF ARTERIOVENOUS GORETEX GRAFT Left 05/04/2018   Procedure: TRANSPOSITION OF CEPHALIC VEIN ARTERIOVENOUS FISTULA LEFT ARM;  Surgeon: Oris Krystal FALCON, MD;  Location: [facility] OR;  Service: Vascular;  Laterality: Left;   SAVORY DILATION N/A 12/18/2016   Procedure: SAVORY DILATION;  Surgeon: Harvey Margo CROME, MD;  Location: [facility];  Service: Endoscopy;  Laterality: N/A;   TRANSMETATARSAL AMPUTATION Left 08/15/2022   Procedure: TRANSMETATARSAL AMPUTATION;  Surgeon: Silva Juliene SAUNDERS, DPM;  Location: [facility] OR;  Service: Podiatry;  Laterality: Left;   Social History:  reports that she has never smoked. She has never been exposed to tobacco smoke. She has never used smokeless tobacco. She reports that she does not drink alcohol and does not use drugs.  Allergies  Allergen Reactions   Ace Inhibitors Anaphylaxis and Swelling   Penicillins Itching and Swelling   Statins Other (See Comments)    elevated LFT's     Albuterol  Swelling    Family History  Problem Relation Age of Onset   Hypertension Father  Hypercholesterolemia Father    Arthritis Father    Hypertension Sister    Hypercholesterolemia Sister    Breast cancer Sister    Hypertension Sister    Colon cancer Neg Hx    Colon polyps Neg Hx     Prior to Admission medications   Medication Sig Start Date End Date Taking? Authorizing Provider  amLODipine  (NORVASC ) 10 MG tablet Take 1 tablet (10 mg total) by mouth daily. 04/08/23   Vannie Reche RAMAN, NP  aspirin  81 MG chewable tablet Chew 2 tablets (162 mg total) by mouth daily. 12/19/22   Johnson, Clanford L, MD  B Complex-C-Folic Acid  (RENA-VITE RX) 1 MG TABS Take 1 tablet by mouth daily. 04/15/22   [provider]  benzonatate  (TESSALON ) 100 MG capsule Take one cpsule once daily for cough and congestion, as meeded 04/22/23   Antonetta Rollene BRAVO, MD  calcium  acetate (PHOSLO ) 667 MG tablet Take 667 mg by mouth 3 (three) times daily. 12/22/22   [provider]  cinacalcet   (SENSIPAR ) 30 MG tablet Take 3 tablets (90 mg total) by mouth 3 (three) times a week. 09/09/22   Sherrill Cable Latif, DO  clopidogrel  (PLAVIX ) 75 MG tablet Take 1 tablet (75 mg total) by mouth daily. 12/22/22   Camara, Amadou, MD  doxycycline  (VIBRA -TABS) 100 MG tablet Take 1 tablet (100 mg total) by mouth 2 (two) times daily for 7 days. 04/27/23 05/04/23  Antonetta Rollene BRAVO, MD  DULoxetine  (CYMBALTA ) 60 MG capsule TAKE (1) CAPSULE BY MOUTH ONCE DAILY. 04/05/23   Antonetta Rollene BRAVO, MD  ezetimibe  (ZETIA ) 10 MG tablet Take 1 tablet (10 mg total) by mouth daily. 04/27/23   Antonetta Rollene BRAVO, MD  FIASP  FLEXTOUCH 100 UNIT/ML FlexTouch Pen INJECT 18 TO 30 UNITS INTO THE SKIN WITH BREAKFAST, WITH LUNCH, AND WITH EVENING MEAL Patient taking differently: Inject 1-10 Units into the skin 3 (three) times daily. Sliding scale 10/20/22   Trixie File, MD  glucose blood (ONETOUCH VERIO) test strip Use as instructed to check blood sugar 4X daily. 04/16/22   Trixie File, MD  hydrALAZINE  (APRESOLINE ) 25 MG tablet Take 25 mg by mouth 2 (two) times daily. 04/27/23   [provider]  insulin  glargine, 2 Unit Dial , (TOUJEO  MAX SOLOSTAR) 300 UNIT/ML Solostar Pen Inject 30 Units into the skin daily. May need to slowly increase the dose depending upon your blood sugar, follow-up with PCP 01/01/23   Trixie File, MD  metoprolol  succinate (TOPROL -XL) 50 MG 24 hr tablet TAKE (1) TABLET BY MOUTH DAILY WITH FOOD *TAKE AFTER DIALYSIS* 01/05/23   Antonetta Rollene BRAVO, MD  sertraline  (ZOLOFT ) 50 MG tablet Take 50 mg by mouth daily. 04/27/23   [provider]  torsemide  (DEMADEX ) 100 MG tablet Take 100 mg by mouth See admin instructions. Take in Tues,Thursday,Sat & Sun 12/01/22   [provider]  [name]  500 MG chewable tablet Chew 1-2 tablets (500-1,000 mg total) by mouth See admin instructions. Take 1000mg  (2 tablets) by mouth with meals and 500mg  (1 tablet) with snacks 12/24/22   Antonetta Rollene BRAVO,  MD  FLUoxetine (PROZAC) 10 MG capsule Take 10 mg by mouth daily.    05/28/11  [provider]  glipiZIDE  (GLUCOTROL ) 10 MG tablet Take 10 mg by mouth 2 (two) times daily before a meal.    05/28/11  [provider]    Physical Exam: Vitals:   04/30/23 1325 04/30/23 1345 04/30/23 1430 04/30/23 1515  BP:  (!) 170/76 (!) 186/88 (!) 177/90  Pulse:  86 89 86  Resp:  (!) 21 18 (!) 21  Temp:      TempSrc:      SpO2: 91% 100% 98% 94%   General: Elderly female. Awake and alert and oriented x3. No acute cardiopulmonary distress.  HEENT: Normocephalic atraumatic.  Right and left ears normal in appearance.  Pupils equal, round, reactive to light. Extraocular muscles are intact. Sclerae anicteric and noninjected.  Moist mucosal membranes. No mucosal lesions.  Neck: Neck supple without lymphadenopathy. No carotid bruits. No masses palpated.  Cardiovascular: Regular rate with normal S1-S2 sounds. No murmurs, rubs, gallops auscultated. No JVD.  Respiratory: Inspiratory and expiratory rales. No accessory muscle use. Abdomen: Soft, nontender, nondistended. Active bowel sounds. No masses or hepatosplenomegaly  Skin: No rashes, lesions, or ulcerations.  Dry, warm to touch. 2+ dorsalis pedis and radial pulses. Musculoskeletal: Bilateral lower extremity amputations.  All major joints not erythematous nontender.  No upper or lower joint deformation.  Good ROM.  No contractures  Psychiatric: Intact judgment and insight. Pleasant and cooperative. Neurologic: No focal neurological deficits. Strength is 5/5 and symmetric in upper and lower extremities.  Cranial nerves II through XII are grossly intact.  Data Reviewed: Results for orders placed or performed during the hospital encounter of 04/30/23 (from the past 24 hours)  Resp panel by RT-PCR (RSV, Flu A&B, Covid) Anterior Nasal Swab     Status: Abnormal   Collection Time: 04/30/23  1:24 PM   Specimen: Anterior Nasal Swab  Result Value Ref Range    SARS Coronavirus 2 by RT PCR NEGATIVE NEGATIVE   Influenza A by PCR NEGATIVE NEGATIVE   Influenza B by PCR NEGATIVE NEGATIVE   Resp Syncytial Virus by PCR POSITIVE (A) NEGATIVE  Basic metabolic panel     Status: Abnormal   Collection Time: 04/30/23  1:30 PM  Result Value Ref Range   Sodium 143 135 - 145 mmol/L   Potassium 4.3 3.5 - 5.1 mmol/L   Chloride 102 98 - 111 mmol/L   CO2 25 22 - 32 mmol/L   Glucose, Bld 175 (H) 70 - 99 mg/dL   BUN 57 (H) 8 - 23 mg/dL   Creatinine, Ser 3.25 (H) 0.44 - 1.00 mg/dL   Calcium  9.8 8.9 - 10.3 mg/dL   GFR, Estimated 6 (L) >60 mL/min   Anion gap 16 (H) 5 - 15  CBC with Differential     Status: Abnormal   Collection Time: 04/30/23  1:30 PM  Result Value Ref Range   WBC 5.4 4.0 - 10.5 K/uL   RBC 4.36 3.87 - 5.11 MIL/uL   Hemoglobin 11.2 (L) 12.0 - 15.0 g/dL   HCT 64.0 (L) 63.9 - 53.9 %   MCV 82.3 80.0 - 100.0 fL   MCH 25.7 (L) 26.0 - 34.0 pg   MCHC 31.2 30.0 - 36.0 g/dL   RDW 82.2 (H) 88.4 - 84.4 %   Platelets 162 150 - 400 K/uL   nRBC 0.0 0.0 - 0.2 %   Neutrophils Relative % 68 %   Neutro Abs 3.7 1.7 - 7.7 K/uL   Lymphocytes Relative 22 %   Lymphs Abs 1.2 0.7 - 4.0 K/uL   Monocytes Relative 8 %   Monocytes Absolute 0.4 0.1 - 1.0 K/uL   Eosinophils Relative 1 %   Eosinophils Absolute 0.0 0.0 - 0.5 K/uL   Basophils Relative 0 %   Basophils Absolute 0.0 0.0 - 0.1 K/uL   Immature Granulocytes 1 %   Abs Immature Granulocytes 0.04  0.00 - 0.07 K/uL   *Note: Due to a large number of results and/or encounters for the requested time period, some results have not been displayed. A complete set of results can be found in Results Review.    DG Chest Port 1 View Result Date: 04/30/2023 CLINICAL DATA:  Shortness of breath.  Weakness. EXAM: PORTABLE CHEST 1 VIEW COMPARISON:  04/27/2023. FINDINGS: Low lung volume. There is left retrocardiac airspace opacity obscuring the left hemidiaphragm, descending thoracic aorta and blunting the left lateral  costophrenic angle, suggesting combination of left lung atelectasis and/or consolidation with pleural effusion. Moderate increased bronchovascular markings noted with hilar and lower lobe predominance, which is likely slightly accentuated by low lung volume. Bilateral lung fields are otherwise clear. Right lateral costophrenic angle is clear. Stable mildly enlarged cardio-mediastinal silhouette. No acute osseous abnormalities. The soft tissues are within normal limits. IMPRESSION: *Cardiomegaly with moderate increased bronchovascular markings, favor congestive heart failure/pulmonary edema. *There is associated left retrocardiac opacity, which may represent combination of left lung atelectasis and/or consolidation with pleural effusion. Electronically Signed   By: Ree Molt M.D.   On: 04/30/2023 15:13     Assessment and Plan: No notes have been filed under this hospital service. Service: Hospitalist  Principal Problem:   RSV (acute bronchiolitis due to respiratory syncytial virus) Active Problems:   ESRD on hemodialysis (HCC)   HTN (hypertension)   Chronic diastolic (congestive) heart failure (HCC)   Diabetes mellitus (HCC)  RSV bronchiolitis Does desat with ambulation Will observe overnight.  Start treatment with long-acting bronchodilators and inhaled corticosteroids Patient does have allergies to albuterol  so will not give albuterol  at the present time No steroids indicated Will check procalcitonin to look for superimposed bacterial pneumonia If she appears better than should be able to go home tomorrow End-stage renal disease on dialysis with heart failure with preserved EF There is a component of fluid overload Nephrology has been consulted for dialysis Will see her breathing status following dialysis Diabetes Continue long-acting insulin  and sliding scale Hypertension Continue antihypertensives   Advance Care Planning:   Code Status: Prior full code confirmed by  patient  Consults: Nephrology for dialysis  Family Communication: Husband present during interview and exam  Severity of Illness: The appropriate patient status for this patient is OBSERVATION. Observation status is judged to be reasonable and necessary in order to provide the required intensity of service to ensure the patient's safety. The patient's presenting symptoms, physical exam findings, and initial radiographic and laboratory data in the context of their medical condition is felt to place them at decreased risk for further clinical deterioration. Furthermore, it is anticipated that the patient will be medically stable for discharge from the hospital within 2 midnights of admission.   Author: Sua Spadafora J Nelvin Tomb, DO 04/30/2023 4:51 PM  For on call review [email or web address].

## 2023-04-30 NOTE — ED Triage Notes (Signed)
 Pt bib EMS from home for SOB, Pt gets dialysis,  supposed to go today but couldn't due to feeling bad. Endorses nasal congestion and SOB. Recently diagnosed with Pneumonia on the 4th and taking antibiotics. 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 94% on RA on arrival.. Left arm Restricted.

## 2023-04-30 NOTE — ED Provider Notes (Signed)
 [facility] AT [facility] Provider Note   CSN: [id number] Arrival date & time: 04/30/23  1224     History  Chief Complaint  Patient presents with   Fatigue   Shortness of Breath    Pt bib EMS from home for SOB, Pt gets dialysis,  supposed to go today but couldn't due to feeling bad. Endorses nasal congestion and SOB. Recently diagnosed with Pneumonia on the 4th and taking antibiotics. 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 94% on RA on arrival.. Left arm Restricted.    Kathryn Beck is a 68 y.o. female.   Shortness of Breath Patient shortness of breath and cough over the last 2 weeks.  Has been on antibiotics twice per reported by on Zithromycin and then another unknown antibiotic.  Has some cough some sputum production.  More fatigued.  Cannot do the same activity she did before.  Reportedly had negative flu and COVID testing.  Had x-ray done on the fourth that showed potential pneumonia versus edema.  Patient states she was told that it was fluid and dialysis would take it off.  She is Monday Wednesday Friday dialysis with today being Friday.  She missed dialysis today went Wednesday and missed Monday.    Past Medical History:  Diagnosis Date   Acid reflux    Amputated left leg (HCC)    Anemia    Arthritis    Axillary masses    Soft tissue - status post excision   Back pain    Cancer (HCC)    CHF (congestive heart failure) (HCC)    COVID-19 virus infection 04/06/2019   Depression    End-stage renal disease (HCC)    M/W/F dialysis   Essential hypertension    Headache    years ago   History of blood transfusion    History of cardiac catheterization    Normal coronary arteries October 2020   History of claustrophobia    History of pneumonia 2019   Hypoxia 04/03/2019   Memory loss    Mixed hyperlipidemia    Obesity    Pancreatitis    Peritoneal dialysis catheter in place [facility])    Pneumonia due to COVID-19 virus 04/02/2019   Sleep  apnea    Noncompliant with CPAP   Stroke (HCC)    mini stroke   Type 2 diabetes mellitus (HCC)     Home Medications Prior to Admission medications   Medication Sig Start Date End Date Taking? Authorizing Provider  amLODipine  (NORVASC ) 10 MG tablet Take 1 tablet (10 mg total) by mouth daily. 04/08/23   Vannie Reche RAMAN, NP  aspirin  81 MG chewable tablet Chew 2 tablets (162 mg total) by mouth daily. 12/19/22   Johnson, Clanford L, MD  B Complex-C-Folic Acid  (RENA-VITE RX) 1 MG TABS Take 1 tablet by mouth daily. 04/15/22   [provider]  benzonatate  (TESSALON ) 100 MG capsule Take one cpsule once daily for cough and congestion, as meeded 04/22/23   Antonetta Rollene BRAVO, MD  calcium  acetate (PHOSLO ) 667 MG tablet Take 667 mg by mouth 3 (three) times daily. 12/22/22   [provider]  cinacalcet  (SENSIPAR ) 30 MG tablet Take 3 tablets (90 mg total) by mouth 3 (three) times a week. 09/09/22   Sherrill Cable Latif, DO  clopidogrel  (PLAVIX ) 75 MG tablet Take 1 tablet (75 mg total) by mouth daily. 12/22/22   Camara, Amadou, MD  doxycycline  (VIBRA -TABS) 100 MG tablet Take 1 tablet (100 mg total) by mouth 2 (  two) times daily for 7 days. 04/27/23 05/04/23  Antonetta Rollene BRAVO, MD  DULoxetine  (CYMBALTA ) 60 MG capsule TAKE (1) CAPSULE BY MOUTH ONCE DAILY. 04/05/23   Antonetta Rollene BRAVO, MD  ezetimibe  (ZETIA ) 10 MG tablet Take 1 tablet (10 mg total) by mouth daily. 04/27/23   Antonetta Rollene BRAVO, MD  FIASP  FLEXTOUCH 100 UNIT/ML FlexTouch Pen INJECT 18 TO 30 UNITS INTO THE SKIN WITH BREAKFAST, WITH LUNCH, AND WITH EVENING MEAL Patient taking differently: Inject 1-10 Units into the skin 3 (three) times daily. Sliding scale 10/20/22   Trixie File, MD  glucose blood (ONETOUCH VERIO) test strip Use as instructed to check blood sugar 4X daily. 04/16/22   Trixie File, MD  hydrALAZINE  (APRESOLINE ) 25 MG tablet Take 25 mg by mouth 2 (two) times daily. 04/27/23   [provider]  insulin   glargine, 2 Unit Dial , (TOUJEO  MAX SOLOSTAR) 300 UNIT/ML Solostar Pen Inject 30 Units into the skin daily. May need to slowly increase the dose depending upon your blood sugar, follow-up with PCP 01/01/23   Trixie File, MD  metoprolol  succinate (TOPROL -XL) 50 MG 24 hr tablet TAKE (1) TABLET BY MOUTH DAILY WITH FOOD *TAKE AFTER DIALYSIS* 01/05/23   Antonetta Rollene BRAVO, MD  sertraline  (ZOLOFT ) 50 MG tablet Take 50 mg by mouth daily. 04/27/23   [provider]  torsemide  (DEMADEX ) 100 MG tablet Take 100 mg by mouth See admin instructions. Take in Tues,Thursday,Sat & Sun 12/01/22   [provider]  [name]  500 MG chewable tablet Chew 1-2 tablets (500-1,000 mg total) by mouth See admin instructions. Take 1000mg  (2 tablets) by mouth with meals and 500mg  (1 tablet) with snacks 12/24/22   Antonetta Rollene BRAVO, MD  FLUoxetine (PROZAC) 10 MG capsule Take 10 mg by mouth daily.    05/28/11  [provider]  glipiZIDE  (GLUCOTROL ) 10 MG tablet Take 10 mg by mouth 2 (two) times daily before a meal.    05/28/11  [provider]      Allergies    Ace inhibitors, Penicillins, Statins, and Albuterol     Review of Systems   Review of Systems  Respiratory:  Positive for shortness of breath.     Physical Exam Updated Vital Signs BP (!) 177/90   Pulse 86   Temp 98.4 F (36.9 C) (Oral)   Resp (!) 21   SpO2 94%  Physical Exam Vitals and nursing note reviewed.  Cardiovascular:     Rate and Rhythm: Regular rhythm.  Pulmonary:     Breath sounds: Wheezing present.  Chest:     Chest wall: No tenderness.  Musculoskeletal:     Comments: Bilateral with below the knee amputation.  Neurological:     Mental Status: She is alert.     ED Results / Procedures / Treatments   Labs (all labs ordered are listed, but only abnormal results are displayed) Labs Reviewed  RESP PANEL BY RT-PCR (RSV, FLU A&B, COVID)  RVPGX2 - Abnormal; Notable for the following components:      Result  Value   Resp Syncytial Virus by PCR POSITIVE (*)    All other components within normal limits  BASIC METABOLIC PANEL - Abnormal; Notable for the following components:   Glucose, Bld 175 (*)    BUN 57 (*)    Creatinine, Ser 6.74 (*)    GFR, Estimated 6 (*)    Anion gap 16 (*)    All other components within normal limits  CBC WITH DIFFERENTIAL/PLATELET - Abnormal; Notable for the  following components:   Hemoglobin 11.2 (*)    HCT 35.9 (*)    MCH 25.7 (*)    RDW 17.7 (*)    All other components within normal limits  HEPATITIS B SURFACE ANTIGEN  HEPATITIS B SURFACE ANTIBODY, QUANTITATIVE    EKG EKG Interpretation Date/Time:  Friday April 30 2023 12:39:19 EST Ventricular Rate:  86 PR Interval:  149 QRS Duration:  86 QT Interval:  385 QTC Calculation: 461 R Axis:   99  Text Interpretation: Sinus rhythm Atrial premature complex Right axis deviation Confirmed by Suzette Pac [phone]) on 04/30/2023 1:19:27 PM  Radiology DG Chest Port 1 View Result Date: 04/30/2023 CLINICAL DATA:  Shortness of breath.  Weakness. EXAM: PORTABLE CHEST 1 VIEW COMPARISON:  04/27/2023. FINDINGS: Low lung volume. There is left retrocardiac airspace opacity obscuring the left hemidiaphragm, descending thoracic aorta and blunting the left lateral costophrenic angle, suggesting combination of left lung atelectasis and/or consolidation with pleural effusion. Moderate increased bronchovascular markings noted with hilar and lower lobe predominance, which is likely slightly accentuated by low lung volume. Bilateral lung fields are otherwise clear. Right lateral costophrenic angle is clear. Stable mildly enlarged cardio-mediastinal silhouette. No acute osseous abnormalities. The soft tissues are within normal limits. IMPRESSION: *Cardiomegaly with moderate increased bronchovascular markings, favor congestive heart failure/pulmonary edema. *There is associated left retrocardiac opacity, which may represent combination of  left lung atelectasis and/or consolidation with pleural effusion. Electronically Signed   By: Ree Molt M.D.   On: 04/30/2023 15:13    Procedures Procedures    Medications Ordered in ED Medications  Chlorhexidine  Gluconate Cloth 2 % PADS 6 each (has no administration in time range)    ED Course/ Medical Decision Making/ A&P                                 Medical Decision Making  Patient shortness of breath and cough.  Is had for couple weeks.  Has been on antibiotics.  X-ray as an outpatient showed edema versus infection.  Reportedly had negative flu and COVID testing.  Differential diagnosis does include pneumonia, volume overload, viral infection.  Testing here does show patient is RSV.  However does have some hypoxia and requires nasal  cannula oxygen .  X-ray showed edema and potential pneumonia.  However with positive RSV I will allow admitting team to decide if they want further antibiotics.  Reportedly has had some diarrhea and had recent C. difficile testing.    Patient has missed dialysis.  With the hypoxia I think will require admission to the hospital for more urgent dialysis.  Discussed with Dr. Tobie who will  Arrange for dialysis.    Will discuss with hospitalist for admission.    Patient has albuterol  allergy .        Final Clinical Impression(s) / ED Diagnoses Final diagnoses:  RSV (respiratory syncytial virus infection)  End stage renal disease on dialysis [facility])  Noncompliance of patient with renal dialysis [facility])  Hypoxia    Rx / DC Orders ED Discharge Orders     None         Patsey Lot, MD 04/30/23 1534

## 2023-04-30 NOTE — Progress Notes (Signed)
 Pt arrived to room 314 via stretcher from ED. Pt moved by staff from stretcher to bed. Pt denies c/o at present. A&O, oriented to room and safety precautions, states understanding. Pt notified that dialysis session is planned for 1930 tonight per dialysis nurse. Pt agreeable. Call bell within reach, bed alarm on for safety. Advised to call for needs.

## 2023-04-30 NOTE — ED Notes (Signed)
 ED TO INPATIENT HANDOFF REPORT  ED Nurse Name and Phone #: Clayborne Dade RN   S Name/Age/Gender Kathryn Beck 68 y.o. female Room/Bed: APA18/APA18  Code Status   Code Status: Full Code  Home/SNF/Other Home Patient oriented to: self, place, time, and situation Is this baseline? Yes   Triage Complete: Triage complete  Chief Complaint RSV (acute bronchiolitis due to respiratory syncytial virus) [J21.0]  Triage Note Pt bib EMS from home for SOB, Pt gets dialysis,  supposed to go today but couldn't due to feeling bad. Endorses nasal congestion and SOB. Recently diagnosed with Pneumonia on the 4th and taking antibiotics. 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 94% on RA on arrival.. Left arm Restricted.   Allergies Allergies  Allergen Reactions   Ace Inhibitors Anaphylaxis and Swelling   Penicillins Itching and Swelling   Statins Other (See Comments)    elevated LFT's     Albuterol  Swelling    Level of Care/Admitting Diagnosis ED Disposition     ED Disposition  Admit   Condition  --   Comment  Hospital Area: [facility] [[id number]]  Level of Care: Telemetry [5]  Covid Evaluation: Asymptomatic - no recent exposure (last 10 days) testing not required  Diagnosis: RSV (acute bronchiolitis due to respiratory syncytial virus) [781157]  Admitting Physician: BARBRA LANG PARAS [[id number]]  Attending Physician: BARBRA LANG PARAS [[id number]]          B Medical/Surgery History Past Medical History:  Diagnosis Date   Acid reflux    Amputated left leg (HCC)    Anemia    Arthritis    Axillary masses    Soft tissue - status post excision   Back pain    Cancer (HCC)    CHF (congestive heart failure) (HCC)    COVID-19 virus infection 04/06/2019   Depression    End-stage renal disease (HCC)    M/W/F dialysis   Essential hypertension    Headache    years ago   History of blood transfusion    History of cardiac catheterization    Normal coronary arteries October 2020    History of claustrophobia    History of pneumonia 2019   Hypoxia 04/03/2019   Memory loss    Mixed hyperlipidemia    Obesity    Pancreatitis    Peritoneal dialysis catheter in place [facility])    Pneumonia due to COVID-19 virus 04/02/2019   Sleep apnea    Noncompliant with CPAP   Stroke (HCC)    mini stroke   Type 2 diabetes mellitus (HCC)    Past Surgical History:  Procedure Laterality Date   ABDOMINAL AORTOGRAM W/LOWER EXTREMITY N/A 04/30/2022   Procedure: ABDOMINAL AORTOGRAM W/LOWER EXTREMITY;  Surgeon: Magda Debby SAILOR, MD;  Location: [facility] INVAS[facility] CV LAB;  Service: Cardiovascular;  Laterality: N/A;   ABDOMINAL AORTOGRAM W/LOWER EXTREMITY N/A 07/21/2022   Procedure: ABDOMINAL AORTOGRAM W/LOWER EXTREMITY;  Surgeon: Serene Gaile ORN, MD;  Location: [facility] CV [facility];  Service: Cardiovascular;  Laterality: N/A;   ABDOMINAL HYSTERECTOMY     ACHILLES TENDON LENGTHENING  08/15/2022   Procedure: ACHILLES TENDON LENGTHENING;  Surgeon: Silva Juliene SAUNDERS, DPM;  Location: [facility] OR;  Service: Podiatry;;   AMPUTATION Right 05/29/2022   Procedure: RIGHT BELOW THE KNEE AMPUTATION;  Surgeon: Harden Jerona GAILS, MD;  Location: [facility] OR;  Service: Orthopedics;  Laterality: Right;   AMPUTATION Left 09/04/2022   Procedure: AMPUTATION FOOT, serial irrigation;  Surgeon: Joya Stabs, DPM;  Location: [facility] OR;  Service: Podiatry;  Laterality:  Left;  Surgical team to do block   AMPUTATION Left 10/07/2022   Procedure: LEFT BELOW KNEE AMPUTATION;  Surgeon: Harden Jerona GAILS, MD;  Location: [facility] OR;  Service: Orthopedics;  Laterality: Left;   AV FISTULA PLACEMENT Left 09/02/2017   Procedure: creation of left arm ARTERIOVENOUS (AV) FISTULA;  Surgeon: Serene Gaile ORN, MD;  Location: [facility] OR;  Service: Vascular;  Laterality: Left;   COLONOSCOPY  2008   Dr. Harvey: normal    COLONOSCOPY N/A 12/18/2016   Dr. Harvey: multiple tubular adenomas, internal hemorrhoids. Surveillance in 3 years    ESOPHAGEAL DILATION N/A 10/13/2015   Procedure:  ESOPHAGEAL DILATION;  Surgeon: Claudis RAYMOND Rivet, MD;  Location: [facility];  Service: Endoscopy;  Laterality: N/A;   ESOPHAGOGASTRODUODENOSCOPY N/A 10/13/2015   Dr. Rivet: chronic gastritis on path, no H.pylori. Empiric dilation    ESOPHAGOGASTRODUODENOSCOPY N/A 12/18/2016   Dr. Harvey: mild gastritis. BRAVO study revealed uncontrolled GERD. Dysphagia secondary to uncontrolled reflux   FOOT SURGERY Bilateral    nerve     LEFT HEART CATH AND CORONARY ANGIOGRAPHY N/A 12/29/2018   Procedure: LEFT HEART CATH AND CORONARY ANGIOGRAPHY;  Surgeon: Dann Candyce RAMAN, MD;  Location: [facility] INVASIVE CV LAB;  Service: Cardiovascular;  Laterality: N/A;   LOWER EXTREMITY ANGIOGRAPHY Right 05/04/2022   Procedure: Lower Extremity Angiography;  Surgeon: Lanis Fonda BRAVO, MD;  Location: [facility] INVASIVE CV LAB;  Service: Cardiovascular;  Laterality: Right;   LUNG BIOPSY     MASS EXCISION Right 01/09/2013   Procedure: EXCISION OF NEOPLASM OF RIGHT  AXILLA  AND EXCISION OF NEOPLASM OF LEFT AXILLA;  Surgeon: Oneil DELENA Budge, MD;  Location: [facility] ORS;  Service: General;  Laterality: Right;  procedure end @ 08:23   MYRINGOTOMY WITH TUBE PLACEMENT Bilateral 04/28/2017   Procedure: BILATERAL MYRINGOTOMY WITH TUBE PLACEMENT;  Surgeon: Karis Clunes, MD;  Location: [facility] OR;  Service: ENT;  Laterality: Bilateral;   PERIPHERAL VASCULAR BALLOON ANGIOPLASTY Right 05/04/2022   Procedure: PERIPHERAL VASCULAR BALLOON ANGIOPLASTY;  Surgeon: Lanis Fonda BRAVO, MD;  Location: [facility] INVASIVE CV LAB;  Service: Cardiovascular;  Laterality: Right;  PT   PERIPHERAL VASCULAR INTERVENTION Right 05/04/2022   Procedure: PERIPHERAL VASCULAR INTERVENTION;  Surgeon: Lanis Fonda BRAVO, MD;  Location: [facility] INVASIVE CV LAB;  Service: Cardiovascular;  Laterality: Right;  SFA   PERIPHERAL VASCULAR INTERVENTION Left 07/21/2022   Procedure: PERIPHERAL VASCULAR INTERVENTION;  Surgeon: Serene Gaile ORN, MD;  [facility]: [facility];  Service: Cardiovascular;  Laterality:  Left;   REVISION OF ARTERIOVENOUS GORETEX GRAFT Left 05/04/2018   Procedure: TRANSPOSITION OF CEPHALIC VEIN ARTERIOVENOUS FISTULA LEFT ARM;  Surgeon: Oris Krystal FALCON, MD;  Location: [facility] OR;  Service: Vascular;  Laterality: Left;   SAVORY DILATION N/A 12/18/2016   Procedure: SAVORY DILATION;  Surgeon: Harvey Margo CROME, MD;  [facility];  Service: Endoscopy;  Laterality: N/A;   TRANSMETATARSAL AMPUTATION Left 08/15/2022   Procedure: TRANSMETATARSAL AMPUTATION;  Surgeon: Silva Juliene SAUNDERS, DPM;  Location: [facility] OR;  Service: Podiatry;  Laterality: Left;     A IV Location/Drains/Wounds Patient Lines/Drains/Airways Status     Active Line/Drains/Airways     Name Placement date Placement time Site Days   Peripheral IV 04/30/23 20 G Anterior;Right Forearm 04/30/23  1259  Forearm  less than 1   Fistula / Graft Left Upper arm Arteriovenous fistula 09/02/17  1150  Upper arm  2066   Wound / Incision (Open or Dehisced) 12/18/22 Other (Comment) Leg Left full thickness wounds to left BKA  12/18/22  --  Leg  133            Intake/Output Last 24 hours No intake or output data in the 24 hours ending 04/30/23 1719  Labs/Imaging Results for orders placed or performed during the hospital encounter of 04/30/23 (from the past 48 hours)  Resp panel by RT-PCR (RSV, Flu A&B, Covid) Anterior Nasal Swab     Status: Abnormal   Collection Time: 04/30/23  1:24 PM   Specimen: Anterior Nasal Swab  Result Value Ref Range   SARS Coronavirus 2 by RT PCR NEGATIVE NEGATIVE    Comment: (NOTE) SARS-CoV-2 target nucleic acids are NOT DETECTED.  The SARS-CoV-2 RNA is generally detectable in upper respiratory specimens during the acute phase of infection. The lowest concentration of SARS-CoV-2 viral copies this assay can detect is 138 copies/mL. A negative result does not preclude SARS-Cov-2 infection and should not be used as the sole basis for treatment or other patient management decisions. A negative result may  occur with  improper specimen collection/handling, submission of specimen other than nasopharyngeal swab, presence of viral mutation(s) within the areas targeted by this assay, and inadequate number of viral copies(<138 copies/mL). A negative result must be combined with clinical observations, patient history, and epidemiological information. The expected result is Negative.  Fact Sheet for Patients:  bloggercourse.com  Fact Sheet for Healthcare Providers:  seriousbroker.it  This test is no t yet approved or cleared by the [place]  FDA and  has been authorized for detection and/or diagnosis of SARS-CoV-2 by FDA under an Emergency Use Authorization (EUA). This EUA will remain  in effect (meaning this test can be used) for the duration of the COVID-19 declaration under Section 564(b)(1) of the Act, 21 U.S.C.section 360bbb-3(b)(1), unless the authorization is terminated  or revoked sooner.       Influenza A by PCR NEGATIVE NEGATIVE   Influenza B by PCR NEGATIVE NEGATIVE    Comment: (NOTE) The Xpert Xpress SARS-CoV-2/FLU/RSV plus assay is intended as an aid in the diagnosis of influenza from Nasopharyngeal swab specimens and should not be used as a sole basis for treatment. Nasal washings and aspirates are unacceptable for Xpert Xpress SARS-CoV-2/FLU/RSV testing.  Fact Sheet for Patients: bloggercourse.com  Fact Sheet for Healthcare Providers: seriousbroker.it  This test is not yet approved or cleared by the [place]  FDA and has been authorized for detection and/or diagnosis of SARS-CoV-2 by FDA under an Emergency Use Authorization (EUA). This EUA will remain in effect (meaning this test can be used) for the duration of the COVID-19 declaration under Section 564(b)(1) of the Act, 21 U.S.C. section 360bbb-3(b)(1), unless the authorization is terminated or revoked.      Resp Syncytial Virus by PCR POSITIVE (A) NEGATIVE    Comment: (NOTE) Fact Sheet for Patients: bloggercourse.com  Fact Sheet for Healthcare Providers: seriousbroker.it  This test is not yet approved or cleared by the [place]  FDA and has been authorized for detection and/or diagnosis of SARS-CoV-2 by FDA under an Emergency Use Authorization (EUA). This EUA will remain in effect (meaning this test can be used) for the duration of the COVID-19 declaration under Section 564(b)(1) of the Act, 21 U.S.C. section 360bbb-3(b)(1), unless the authorization is terminated or revoked.  Performed at [facility], [address] [place], [place] [address]   Basic metabolic panel     Status: Abnormal   Collection Time: 04/30/23  1:30 PM  Result Value Ref Range   Sodium 143 135 - 145 mmol/L  Potassium 4.3 3.5 - 5.1 mmol/L   Chloride 102 98 - 111 mmol/L   CO2 25 22 - 32 mmol/L   Glucose, Bld 175 (H) 70 - 99 mg/dL    Comment: Glucose reference range applies only to samples taken after fasting for at least 8 hours.   BUN 57 (H) 8 - 23 mg/dL   Creatinine, Ser 3.25 (H) 0.44 - 1.00 mg/dL   Calcium  9.8 8.9 - 10.3 mg/dL   GFR, Estimated 6 (L) >60 mL/min    Comment: (NOTE) Calculated using the CKD-EPI Creatinine Equation (2021)    Anion gap 16 (H) 5 - 15    Comment: Performed at [facility], [address], [place], [place] [address]  CBC with Differential     Status: Abnormal   Collection Time: 04/30/23  1:30 PM  Result Value Ref Range   WBC 5.4 4.0 - 10.5 K/uL   RBC 4.36 3.87 - 5.11 MIL/uL   Hemoglobin 11.2 (L) 12.0 - 15.0 g/dL   HCT 64.0 (L) 63.9 - 53.9 %   MCV 82.3 80.0 - 100.0 fL   MCH 25.7 (L) 26.0 - 34.0 pg   MCHC 31.2 30.0 - 36.0 g/dL   RDW 82.2 (H) 88.4 - 84.4 %   Platelets 162 150 - 400 K/uL    Comment: REPEATED TO VERIFY   nRBC 0.0 0.0 - 0.2 %   Neutrophils Relative % 68 %   Neutro Abs 3.7 1.7 - 7.7 K/uL   Lymphocytes Relative  22 %   Lymphs Abs 1.2 0.7 - 4.0 K/uL   Monocytes Relative 8 %   Monocytes Absolute 0.4 0.1 - 1.0 K/uL   Eosinophils Relative 1 %   Eosinophils Absolute 0.0 0.0 - 0.5 K/uL   Basophils Relative 0 %   Basophils Absolute 0.0 0.0 - 0.1 K/uL   Immature Granulocytes 1 %   Abs Immature Granulocytes 0.04 0.00 - 0.07 K/uL    Comment: Performed at [facility], [address] [place], [place] [address]   *Note: Due to a large number of results and/or encounters for the requested time period, some results have not been displayed. A complete set of results can be found in Results Review.   DG Chest Port 1 View Result Date: 04/30/2023 CLINICAL DATA:  Shortness of breath.  Weakness. EXAM: PORTABLE CHEST 1 VIEW COMPARISON:  04/27/2023. FINDINGS: Low lung volume. There is left retrocardiac airspace opacity obscuring the left hemidiaphragm, descending thoracic aorta and blunting the left lateral costophrenic angle, suggesting combination of left lung atelectasis and/or consolidation with pleural effusion. Moderate increased bronchovascular markings noted with hilar and lower lobe predominance, which is likely slightly accentuated by low lung volume. Bilateral lung fields are otherwise clear. Right lateral costophrenic angle is clear. Stable mildly enlarged cardio-mediastinal silhouette. No acute osseous abnormalities. The soft tissues are within normal limits. IMPRESSION: *Cardiomegaly with moderate increased bronchovascular markings, favor congestive heart failure/pulmonary edema. *There is associated left retrocardiac opacity, which may represent combination of left lung atelectasis and/or consolidation with pleural effusion. Electronically Signed   By: Ree Molt M.D.   On: 04/30/2023 15:13    Pending Labs Unresulted Labs (From admission, onward)     Start     Ordered   04/30/23 1900  Hepatitis B surface antigen  Once,   AD        04/30/23 1900   04/30/23 1900  Hepatitis B surface antibody,quantitative   Once,   AD        04/30/23 1900  04/30/23 1602  Procalcitonin  Once,   R       References:    Procalcitonin Lower Respiratory Tract Infection AND Sepsis Procalcitonin Algorithm   04/30/23 1601   Signed and Held  HIV Antibody (routine testing w rflx)  (HIV Antibody (Routine testing w reflex) panel)  Once,   R        Signed and Held   Signed and Held  Basic metabolic panel  Tomorrow morning,   R        Signed and Held            Vitals/Pain Today's Vitals   04/30/23 1515 04/30/23 1530 04/30/23 1700 04/30/23 1704  BP: (!) 177/90  (!) 157/79   Pulse: 86  89   Resp: (!) 21  (!) 22   Temp:    98 F (36.7 C)  TempSrc:    Oral  SpO2: 94%  97%   PainSc:  0-No pain      Isolation Precautions No active isolations  Medications Medications  Chlorhexidine  Gluconate Cloth 2 % PADS 6 each (has no administration in time range)  fluticasone  furoate-vilanterol (BREO ELLIPTA ) 200-25 MCG/ACT 1 puff (has no administration in time range)    Mobility walks with device     Focused Assessments Pulmonary Assessment Handoff:  Lung sounds: L Breath Sounds: Expiratory wheezes R Breath Sounds: Expiratory wheezes O2 Device: Nasal Cannula O2 Flow Rate (L/min): 2 L/min    R Recommendations: See Admitting Provider Note  Report given to:   Additional Notes: Patient is a&ox4, feds self, bilateral BKA.

## 2023-05-01 DIAGNOSIS — Z8249 Family history of ischemic heart disease and other diseases of the circulatory system: Secondary | ICD-10-CM | POA: Diagnosis not present

## 2023-05-01 DIAGNOSIS — Z794 Long term (current) use of insulin: Secondary | ICD-10-CM | POA: Diagnosis not present

## 2023-05-01 DIAGNOSIS — Z8616 Personal history of COVID-19: Secondary | ICD-10-CM | POA: Diagnosis not present

## 2023-05-01 DIAGNOSIS — I132 Hypertensive heart and chronic kidney disease with heart failure and with stage 5 chronic kidney disease, or end stage renal disease: Secondary | ICD-10-CM | POA: Diagnosis present

## 2023-05-01 DIAGNOSIS — Z89512 Acquired absence of left leg below knee: Secondary | ICD-10-CM | POA: Diagnosis not present

## 2023-05-01 DIAGNOSIS — Z992 Dependence on renal dialysis: Secondary | ICD-10-CM | POA: Diagnosis not present

## 2023-05-01 DIAGNOSIS — E669 Obesity, unspecified: Secondary | ICD-10-CM | POA: Diagnosis present

## 2023-05-01 DIAGNOSIS — J210 Acute bronchiolitis due to respiratory syncytial virus: Secondary | ICD-10-CM

## 2023-05-01 DIAGNOSIS — Z7902 Long term (current) use of antithrombotics/antiplatelets: Secondary | ICD-10-CM | POA: Diagnosis not present

## 2023-05-01 DIAGNOSIS — Z7982 Long term (current) use of aspirin: Secondary | ICD-10-CM | POA: Diagnosis not present

## 2023-05-01 DIAGNOSIS — J9601 Acute respiratory failure with hypoxia: Secondary | ICD-10-CM | POA: Diagnosis present

## 2023-05-01 DIAGNOSIS — Z7984 Long term (current) use of oral hypoglycemic drugs: Secondary | ICD-10-CM | POA: Diagnosis not present

## 2023-05-01 DIAGNOSIS — E782 Mixed hyperlipidemia: Secondary | ICD-10-CM | POA: Diagnosis present

## 2023-05-01 DIAGNOSIS — E1165 Type 2 diabetes mellitus with hyperglycemia: Secondary | ICD-10-CM | POA: Diagnosis present

## 2023-05-01 DIAGNOSIS — E876 Hypokalemia: Secondary | ICD-10-CM | POA: Diagnosis present

## 2023-05-01 DIAGNOSIS — Z89511 Acquired absence of right leg below knee: Secondary | ICD-10-CM | POA: Diagnosis not present

## 2023-05-01 DIAGNOSIS — J159 Unspecified bacterial pneumonia: Secondary | ICD-10-CM | POA: Diagnosis present

## 2023-05-01 DIAGNOSIS — I5032 Chronic diastolic (congestive) heart failure: Secondary | ICD-10-CM | POA: Diagnosis present

## 2023-05-01 DIAGNOSIS — E1142 Type 2 diabetes mellitus with diabetic polyneuropathy: Secondary | ICD-10-CM | POA: Diagnosis present

## 2023-05-01 DIAGNOSIS — E1122 Type 2 diabetes mellitus with diabetic chronic kidney disease: Secondary | ICD-10-CM | POA: Diagnosis present

## 2023-05-01 DIAGNOSIS — Z1152 Encounter for screening for COVID-19: Secondary | ICD-10-CM | POA: Diagnosis not present

## 2023-05-01 DIAGNOSIS — R0602 Shortness of breath: Secondary | ICD-10-CM | POA: Diagnosis not present

## 2023-05-01 DIAGNOSIS — Z79899 Other long term (current) drug therapy: Secondary | ICD-10-CM | POA: Diagnosis not present

## 2023-05-01 DIAGNOSIS — I2510 Atherosclerotic heart disease of native coronary artery without angina pectoris: Secondary | ICD-10-CM | POA: Diagnosis present

## 2023-05-01 DIAGNOSIS — N186 End stage renal disease: Secondary | ICD-10-CM | POA: Diagnosis present

## 2023-05-01 LAB — BASIC METABOLIC PANEL
Anion gap: 12 (ref 5–15)
BUN: 25 mg/dL — ABNORMAL HIGH (ref 8–23)
CO2: 28 mmol/L (ref 22–32)
Calcium: 9.1 mg/dL (ref 8.9–10.3)
Chloride: 96 mmol/L — ABNORMAL LOW (ref 98–111)
Creatinine, Ser: 3.41 mg/dL — ABNORMAL HIGH (ref 0.44–1.00)
GFR, Estimated: 14 mL/min — ABNORMAL LOW (ref >60)
Glucose, Bld: 144 mg/dL — ABNORMAL HIGH (ref 70–99)
Potassium: 3.3 mmol/L — ABNORMAL LOW (ref 3.5–5.1)
Sodium: 136 mmol/L (ref 135–145)

## 2023-05-01 LAB — GLUCOSE, CAPILLARY
Glucose-Capillary: 118 mg/dL — ABNORMAL HIGH (ref 70–99)
Glucose-Capillary: 220 mg/dL — ABNORMAL HIGH (ref 70–99)
Glucose-Capillary: 246 mg/dL — ABNORMAL HIGH (ref 70–99)
Glucose-Capillary: 358 mg/dL — ABNORMAL HIGH (ref 70–99)

## 2023-05-01 LAB — HEPATITIS B SURFACE ANTIGEN: Hepatitis B Surface Ag: NONREACTIVE

## 2023-05-01 LAB — HIV ANTIBODY (ROUTINE TESTING W REFLEX): HIV Screen 4th Generation wRfx: NONREACTIVE

## 2023-05-01 MED ORDER — POTASSIUM CHLORIDE CRYS ER 20 MEQ PO TBCR
20.0000 meq | EXTENDED_RELEASE_TABLET | Freq: Once | ORAL | Status: AC
  Administered 2023-05-01: 20 meq via ORAL
  Filled 2023-05-01: qty 1

## 2023-05-01 MED ORDER — SUCROFERRIC OXYHYDROXIDE 500 MG PO CHEW: 500.0000 mg | CHEWABLE_TABLET | ORAL | Status: DC | PRN

## 2023-05-01 MED ORDER — LEVOFLOXACIN 500 MG PO TABS: 500.0000 mg | ORAL_TABLET | ORAL | Status: DC

## 2023-05-01 MED ORDER — LEVOFLOXACIN 500 MG PO TABS
500.0000 mg | ORAL_TABLET | Freq: Once | ORAL | Status: AC
  Administered 2023-05-01: 500 mg via ORAL
  Filled 2023-05-01: qty 1

## 2023-05-01 MED ORDER — METHYLPREDNISOLONE SODIUM SUCC 40 MG IJ SOLR
40.0000 mg | Freq: Two times a day (BID) | INTRAMUSCULAR | Status: DC
  Administered 2023-05-01 – 2023-05-02 (×3): 40 mg via INTRAVENOUS
  Filled 2023-05-01 (×3): qty 1

## 2023-05-01 NOTE — Plan of Care (Signed)
  Problem: Education: Goal: Knowledge of General Education information will improve Description: Including pain rating scale, medication(s)/side effects and non-pharmacologic comfort measures Outcome: Progressing   Problem: Health Behavior/Discharge Planning: Goal: Ability to manage health-related needs will improve Outcome: Progressing   Problem: Clinical Measurements: Goal: Ability to maintain clinical measurements within normal limits will improve Outcome: Progressing Goal: Will remain free from infection Outcome: Progressing Goal: Diagnostic test results will improve Outcome: Progressing Goal: Respiratory complications will improve Outcome: Progressing Goal: Cardiovascular complication will be avoided Outcome: Progressing   Problem: Activity: Goal: Risk for activity intolerance will decrease Outcome: Progressing   Problem: Nutrition: Goal: Adequate nutrition will be maintained Outcome: Progressing   Problem: Elimination: Goal: Will not experience complications related to bowel motility Outcome: Progressing Goal: Will not experience complications related to urinary retention Outcome: Progressing   Problem: Pain Managment: Goal: General experience of comfort will improve and/or be controlled Outcome: Progressing   Problem: Safety: Goal: Ability to remain free from injury will improve Outcome: Progressing   Problem: Fluid Volume: Goal: Ability to maintain a balanced intake and output will improve Outcome: Progressing   Problem: Metabolic: Goal: Ability to maintain appropriate glucose levels will improve Outcome: Progressing   Problem: Nutritional: Goal: Maintenance of adequate nutrition will improve Outcome: Progressing Goal: Progress toward achieving an optimal weight will improve Outcome: Progressing   Problem: Skin Integrity: Goal: Risk for impaired skin integrity will decrease Outcome: Progressing   Problem: Tissue Perfusion: Goal: Adequacy of tissue  perfusion will improve Outcome: Progressing   Problem: Activity: Goal: Ability to tolerate increased activity will improve Outcome: Progressing Goal: Will verbalize the importance of balancing activity with adequate rest periods Outcome: Progressing   Problem: Respiratory: Goal: Ability to maintain a clear airway will improve Outcome: Progressing Goal: Levels of oxygenation will improve Outcome: Progressing Goal: Ability to maintain adequate ventilation will improve Outcome: Progressing

## 2023-05-01 NOTE — Plan of Care (Signed)
  Problem: Education: Goal: Knowledge of General Education information will improve Description: Including pain rating scale, medication(s)/side effects and non-pharmacologic comfort measures Outcome: Progressing   Problem: Health Behavior/Discharge Planning: Goal: Ability to manage health-related needs will improve Outcome: Progressing   Problem: Clinical Measurements: Goal: Ability to maintain clinical measurements within normal limits will improve Outcome: Progressing Goal: Will remain free from infection Outcome: Progressing Goal: Diagnostic test results will improve Outcome: Progressing Goal: Respiratory complications will improve Outcome: Progressing Goal: Cardiovascular complication will be avoided Outcome: Progressing   Problem: Activity: Goal: Risk for activity intolerance will decrease Outcome: Progressing   Problem: Nutrition: Goal: Adequate nutrition will be maintained Outcome: Progressing   Problem: Coping: Goal: Level of anxiety will decrease Outcome: Progressing   Problem: Elimination: Goal: Will not experience complications related to bowel motility Outcome: Progressing Goal: Will not experience complications related to urinary retention Outcome: Progressing   Problem: Pain Managment: Goal: General experience of comfort will improve and/or be controlled Outcome: Progressing   Problem: Safety: Goal: Ability to remain free from injury will improve Outcome: Progressing   Problem: Skin Integrity: Goal: Risk for impaired skin integrity will decrease Outcome: Progressing   Problem: Education: Goal: Ability to describe self-care measures that may prevent or decrease complications (Diabetes Survival Skills Education) will improve Outcome: Progressing Goal: Individualized Educational Video(s) Outcome: Progressing   Problem: Coping: Goal: Ability to adjust to condition or change in health will improve Outcome: Progressing   Problem: Fluid  Volume: Goal: Ability to maintain a balanced intake and output will improve Outcome: Progressing   Problem: Health Behavior/Discharge Planning: Goal: Ability to identify and utilize available resources and services will improve Outcome: Progressing Goal: Ability to manage health-related needs will improve Outcome: Progressing   Problem: Metabolic: Goal: Ability to maintain appropriate glucose levels will improve Outcome: Progressing   Problem: Nutritional: Goal: Maintenance of adequate nutrition will improve Outcome: Progressing Goal: Progress toward achieving an optimal weight will improve Outcome: Progressing   Problem: Skin Integrity: Goal: Risk for impaired skin integrity will decrease Outcome: Progressing   Problem: Tissue Perfusion: Goal: Adequacy of tissue perfusion will improve Outcome: Progressing   Problem: Education: Goal: Knowledge of disease or condition will improve Outcome: Progressing Goal: Knowledge of the prescribed therapeutic regimen will improve Outcome: Progressing Goal: Individualized Educational Video(s) Outcome: Progressing   Problem: Activity: Goal: Ability to tolerate increased activity will improve Outcome: Progressing Goal: Will verbalize the importance of balancing activity with adequate rest periods Outcome: Progressing   Problem: Respiratory: Goal: Ability to maintain a clear airway will improve Outcome: Progressing Goal: Levels of oxygenation will improve Outcome: Progressing Goal: Ability to maintain adequate ventilation will improve Outcome: Progressing

## 2023-05-01 NOTE — Progress Notes (Signed)
 PROGRESS NOTE    Kathryn Beck  FMW:[id number] DOB: [birth year] DOA: 04/30/2023 PCP: Antonetta Rollene BRAVO, MD   Brief Narrative:  Kathryn Beck is a 68 y.o. female with medical history significant of end-stage renal disease on dialysis Monday, Wednesday, Friday, heart failure with preserved EF, diabetes, history of bilateral lower EXTR amputations, sleep apnea, obesity.  Patient presents with cough, shortness of breath that has been ongoing over the past couple of days.  She denies fevers, chills, nausea, vomiting.  No known sick contacts.  She saw her primary care physician a few days ago and was prescribed azithromycin , but then switched to doxycycline .  She had no improvements.  She did miss dialysis earlier today.  She does not produce much urine, but still takes torsemide  which she has been compliant with.  Assessment & Plan:   Principal Problem:   RSV (acute bronchiolitis due to respiratory syncytial virus) Active Problems:   ESRD on hemodialysis (HCC)   HTN (hypertension)   Chronic diastolic (congestive) heart failure (HCC)   Diabetes mellitus (HCC)  Acute hypoxic respiratory failure secondary to RSV bronchiolitis and possible community-acquired pneumonia, superinfection, POA: Patient was saturating 89% on room air requiring 2 L of oxygen .  Diagnosed with RSV positive.  Chest x-ray shows pulmonary vascular congestion, no clear-cut infiltrates but procalcitonin elevated.  Does appear to have bacterial pneumonia clinically as well.  She is allergic to bronchodilators/albuterol  and Xopenex .  She is still wheezy and feels poorly.  Will start on IV Solu-Medrol  as well as Augmentin, renally dosed.  She has been weaned to room air currently.  ESRD: Patient is on MWF schedule.  She missed her dialysis yesterday due to feeling poorly, admitted to the hospital and had her routine dialysis after admission.  Nephrology on board.  Hypokalemia: 3.3.  Will replenish slightly.  Type 2 diabetes  mellitus: Patient's PTA regimen of Lantus  30 units has been resumed and she is on SSI and blood sugar controlled.  Essential hypertension: PTA medications include amlodipine  10 mg, hydralazine  25 mg twice daily, Toprol -XL 50 mg.  All medications have been resumed, patient's blood pressure still slightly elevated.  Continue to monitor.  History of CAD: Asymptomatic.  Continue Plavix .  Diabetic polyneuropathy: Continue Cymbalta .  Generalized weakness: Consult PT OT.  DVT prophylaxis: enoxaparin  (LOVENOX ) injection 30 mg Start: 04/30/23 2200 SCDs Start: 04/30/23 1728   Code Status: Full Code  Family Communication: Husband present at bedside.  Plan of care discussed with patient in length and he/she verbalized understanding and agreed with it.  Status is: Observation The patient will require care spanning > 2 midnights and should be moved to inpatient because: Still feels poorly   Estimated body mass index is 28.04 kg/m as calculated from the following:   Height as of 04/08/23: 5' 4 (1.626 m).   Weight as of this encounter: 74.1 kg.    Nutritional Assessment: Body mass index is 28.04 kg/m.Kathryn Beck Seen by dietician.  I agree with the assessment and plan as outlined below: Nutrition Status:        . Skin Assessment: I have examined the patient's skin and I agree with the wound assessment as performed by the wound care RN as outlined below:    Consultants:  Nephrology  Procedures:  None  Antimicrobials:  Anti-infectives (From admission, onward)    None         Subjective: Patient seen and examined, husband at the bedside.  Patient says that her breathing is better than yesterday but  still feels slightly poorly at week generally.  No other complaint.  Objective: Vitals:   05/01/23 0030 05/01/23 0055 05/01/23 0100 05/01/23 0504  BP: (!) 150/84 (!) 160/81 (!) 154/72 (!) 159/78  Pulse:   67 82  Resp: 16 15 20 18   Temp:   98.6 F (37 C) 98.6 F (37 C)  TempSrc:   Oral  Oral  SpO2:  97% 96% 94%  Weight:   74.1 kg     Intake/Output Summary (Last 24 hours) at 05/01/2023 1002 Last data filed at 05/01/2023 0100 Gross per 24 hour  Intake --  Output 4000 ml  Net -4000 ml   Filed Weights   04/30/23 1745 04/30/23 2051 05/01/23 0100  Weight: 78.2 kg 78.3 kg 74.1 kg    Examination:  General exam: Appears calm and comfortable  Respiratory system: Bilateral Rales and expiratory wheezes. Respiratory effort normal. Cardiovascular system: S1 & S2 heard, RRR. No JVD, murmurs, rubs, gallops or clicks. No pedal edema. Gastrointestinal system: Abdomen is nondistended, soft and nontender. No organomegaly or masses felt. Normal bowel sounds heard. Central nervous system: Alert and oriented. No focal neurological deficits. Extremities: Symmetric 5 x 5 power. Skin: No rashes, lesions or ulcers  Data Reviewed: I have personally reviewed following labs and imaging studies  CBC: Recent Labs  Lab 04/30/23 1330  WBC 5.4  NEUTROABS 3.7  HGB 11.2*  HCT 35.9*  MCV 82.3  PLT 162   Basic Metabolic Panel: Recent Labs  Lab 04/30/23 1330 05/01/23 0423  NA 143 136  K 4.3 3.3*  CL 102 96*  CO2 25 28  GLUCOSE 175* 144*  BUN 57* 25*  CREATININE 6.74* 3.41*  CALCIUM  9.8 9.1   GFR: Estimated Creatinine Clearance: 15.8 mL/min (A) (by C-G formula based on SCr of 3.41 mg/dL (H)). Liver Function Tests: No results for input(s): AST, ALT, ALKPHOS, BILITOT, PROT, ALBUMIN  in the last 168 hours. No results for input(s): LIPASE, AMYLASE in the last 168 hours. No results for input(s): AMMONIA in the last 168 hours. Coagulation Profile: No results for input(s): INR, PROTIME in the last 168 hours. Cardiac Enzymes: No results for input(s): CKTOTAL, CKMB, CKMBINDEX, TROPONINI in the last 168 hours. BNP (last 3 results) No results for input(s): PROBNP in the last 8760 hours. HbA1C: No results for input(s): HGBA1C in the last 72  hours. CBG: Recent Labs  Lab 04/30/23 1801 04/30/23 2028 05/01/23 0753  GLUCAP 270* 235* 118*   Lipid Profile: No results for input(s): CHOL, HDL, LDLCALC, TRIG, CHOLHDL, LDLDIRECT in the last 72 hours. Thyroid  Function Tests: No results for input(s): TSH, T4TOTAL, FREET4, T3FREE, THYROIDAB in the last 72 hours. Anemia Panel: No results for input(s): VITAMINB12, FOLATE, FERRITIN, TIBC, IRON, RETICCTPCT in the last 72 hours. Sepsis Labs: Recent Labs  Lab 04/30/23 1330  PROCALCITON 0.70    Recent Results (from the past 240 hours)  Resp panel by RT-PCR (RSV, Flu A&B, Covid) Anterior Nasal Swab     Status: Abnormal   Collection Time: 04/30/23  1:24 PM   Specimen: Anterior Nasal Swab  Result Value Ref Range Status   SARS Coronavirus 2 by RT PCR NEGATIVE NEGATIVE Final    Comment: (NOTE) SARS-CoV-2 target nucleic acids are NOT DETECTED.  The SARS-CoV-2 RNA is generally detectable in upper respiratory specimens during the acute phase of infection. The lowest concentration of SARS-CoV-2 viral copies this assay can detect is 138 copies/mL. A negative result does not preclude SARS-Cov-2 infection and should not be used as the  sole basis for treatment or other patient management decisions. A negative result may occur with  improper specimen collection/handling, submission of specimen other than nasopharyngeal swab, presence of viral mutation(s) within the areas targeted by this assay, and inadequate number of viral copies(<138 copies/mL). A negative result must be combined with clinical observations, patient history, and epidemiological information. The expected result is Negative.  Fact Sheet for Patients:  bloggercourse.com  Fact Sheet for Healthcare Providers:  seriousbroker.it  This test is no t yet approved or cleared by the [place]  FDA and  has been authorized for detection and/or  diagnosis of SARS-CoV-2 by FDA under an Emergency Use Authorization (EUA). This EUA will remain  in effect (meaning this test can be used) for the duration of the COVID-19 declaration under Section 564(b)(1) of the Act, 21 U.S.C.section 360bbb-3(b)(1), unless the authorization is terminated  or revoked sooner.       Influenza A by PCR NEGATIVE NEGATIVE Final   Influenza B by PCR NEGATIVE NEGATIVE Final    Comment: (NOTE) The Xpert Xpress SARS-CoV-2/FLU/RSV plus assay is intended as an aid in the diagnosis of influenza from Nasopharyngeal swab specimens and should not be used as a sole basis for treatment. Nasal washings and aspirates are unacceptable for Xpert Xpress SARS-CoV-2/FLU/RSV testing.  Fact Sheet for Patients: bloggercourse.com  Fact Sheet for Healthcare Providers: seriousbroker.it  This test is not yet approved or cleared by the [place]  FDA and has been authorized for detection and/or diagnosis of SARS-CoV-2 by FDA under an Emergency Use Authorization (EUA). This EUA will remain in effect (meaning this test can be used) for the duration of the COVID-19 declaration under Section 564(b)(1) of the Act, 21 U.S.C. section 360bbb-3(b)(1), unless the authorization is terminated or revoked.     Resp Syncytial Virus by PCR POSITIVE (A) NEGATIVE Final    Comment: (NOTE) Fact Sheet for Patients: bloggercourse.com  Fact Sheet for Healthcare Providers: seriousbroker.it  This test is not yet approved or cleared by the [place]  FDA and has been authorized for detection and/or diagnosis of SARS-CoV-2 by FDA under an Emergency Use Authorization (EUA). This EUA will remain in effect (meaning this test can be used) for the duration of the COVID-19 declaration under Section 564(b)(1) of the Act, 21 U.S.C. section 360bbb-3(b)(1), unless the authorization is terminated  or revoked.  Performed at [facility], [address], [place], [place] [address]   MRSA Next Gen by PCR, Nasal     Status: None   Collection Time: 04/30/23  6:35 PM   Specimen: Nasal Mucosa; Nasal Swab  Result Value Ref Range Status   MRSA by PCR Next Gen NOT DETECTED NOT DETECTED Final    Comment: (NOTE) The GeneXpert MRSA Assay (FDA approved for NASAL specimens only), is one component of a comprehensive MRSA colonization surveillance program. It is not intended to diagnose MRSA infection nor to guide or monitor treatment for MRSA infections. Test performance is not FDA approved in patients less than 30 years old. Performed at [facility], [address], [place], [place] [address]      Radiology Studies: [facility] Chest [facility] 1 View Result Date: 04/30/2023 CLINICAL DATA:  Shortness of breath.  Weakness. EXAM: PORTABLE CHEST 1 VIEW COMPARISON:  04/27/2023. FINDINGS: Low lung volume. There is left retrocardiac airspace opacity obscuring the left hemidiaphragm, descending thoracic aorta and blunting the left lateral costophrenic angle, suggesting combination of left lung atelectasis and/or consolidation with pleural effusion. Moderate increased bronchovascular markings noted with hilar and lower  lobe predominance, which is likely slightly accentuated by low lung volume. Bilateral lung fields are otherwise clear. Right lateral costophrenic angle is clear. Stable mildly enlarged cardio-mediastinal silhouette. No acute osseous abnormalities. The soft tissues are within normal limits. IMPRESSION: *Cardiomegaly with moderate increased bronchovascular markings, favor congestive heart failure/pulmonary edema. *There is associated left retrocardiac opacity, which may represent combination of left lung atelectasis and/or consolidation with pleural effusion. Electronically Signed   By: Ree Molt M.D.   On: 04/30/2023 15:13    Scheduled Meds:  amLODipine   10 mg Oral Daily   benzonatate   100 mg Oral BID    calcium  acetate  667 mg Oral TID   Chlorhexidine  Gluconate Cloth  6 each Topical Q0600   [START ON 05/03/2023] cinacalcet   90 mg Oral Q M,W,F-HD   clopidogrel   75 mg Oral Daily   DULoxetine   60 mg Oral Daily   enoxaparin  (LOVENOX ) injection  30 mg Subcutaneous Q24H   ezetimibe   10 mg Oral Daily   fluticasone  furoate-vilanterol  1 puff Inhalation Daily   guaiFENesin   600 mg Oral BID   hydrALAZINE   25 mg Oral BID   insulin  aspart  0-15 Units Subcutaneous TID WC   insulin  aspart  0-5 Units Subcutaneous QHS   insulin  glargine-yfgn  30 Units Subcutaneous Daily   methylPREDNISolone  (SOLU-MEDROL ) injection  40 mg Intravenous Q12H   metoprolol  succinate  50 mg Oral Daily   sertraline   50 mg Oral Daily   sucroferric oxyhydroxide  1,000 mg Oral TID WC   torsemide   100 mg Oral Once per day on Sunday Tuesday Thursday Saturday   umeclidinium bromide   1 puff Inhalation Daily   Continuous Infusions:   LOS: 0 days   Fredia Skeeter, MD [facility]  05/01/2023, 10:02 AM   *Please note that this is a verbal dictation therefore any spelling or grammatical errors are due to the Dragon Medical One system interpretation.  Please page via Amion and do not message via secure chat for urgent patient care matters. Secure chat can be used for non urgent patient care matters.  How to contact the [facility] Attending or Consulting provider 7A - 7P or covering provider during after hours 7P -7A, for this patient?  Check the care team in [facility] and look for a) attending/consulting [facility] provider listed and b) the [facility] team listed. Page or secure chat 7A-7P. Log into [email or web address] and use [facility] universal password to access. If you do not have the password, please contact the hospital operator. Locate the [facility] provider you are looking for under [facility] and page to a number that you can be directly reached. If you still have difficulty reaching the provider, please page the [facility] (Director on Call) for  the Hospitalists listed on amion for assistance.

## 2023-05-01 NOTE — Progress Notes (Signed)
   HEMODIALYSIS TREATMENT NOTE:  Uneventful 3.75 hour heparin -free treatment completed using left upper arm AVF (15g/antegrade). Goal met: 4 liters removed without interruption in UF.  Hemodynamically stable throughout session.  Weaned off of O2, saturating >97% on room air.  All blood was returned.  Hemostasis was achieved in 15 minutes.    Post-HD:  05/01/23 0100  Vitals  Temp 98.6 F (37 C)  Temp Source Oral  BP (!) 154/72  MAP (mmHg) 94  BP Location Right Arm  BP Method Automatic  Patient Position (if appropriate) Lying  Pulse Rate 67  Pulse Rate Source Dinamap  ECG Heart Rate 87  Resp 20  Oxygen  Therapy  SpO2 96 %  O2 Device Room Air  Post Treatment  Dialyzer Clearance Lightly streaked  Hemodialysis Intake (mL) 0 mL  Liters Processed 90  Fluid Removed (mL) 4000 mL  Tolerated HD Treatment Yes  Post-Hemodialysis Comments Goal met  AVG/AVF Arterial Site Held (minutes) 7 minutes  AVG/AVF Venous Site Held (minutes) 7 minutes  Fistula / Graft Left Upper arm Arteriovenous fistula  Placement Date/Time: 09/02/17 1150   Placed prior to admission: Yes  Orientation: Left  Access Location: Upper arm  Access Type: Arteriovenous fistula  Fistula / Graft Assessment Thrill;Bruit  Status Patent    Jon Laos, RN AP [facility]

## 2023-05-02 ENCOUNTER — Encounter: Payer: Self-pay | Admitting: Family Medicine

## 2023-05-02 DIAGNOSIS — J210 Acute bronchiolitis due to respiratory syncytial virus: Secondary | ICD-10-CM | POA: Diagnosis not present

## 2023-05-02 LAB — CBC WITH DIFFERENTIAL/PLATELET
Abs Immature Granulocytes: 0.04 10*3/uL (ref 0.00–0.07)
Basophils Absolute: 0 10*3/uL (ref 0.0–0.1)
Basophils Relative: 0 %
Eosinophils Absolute: 0 10*3/uL (ref 0.0–0.5)
Eosinophils Relative: 0 %
HCT: 37.8 % (ref 36.0–46.0)
Hemoglobin: 11.5 g/dL — ABNORMAL LOW (ref 12.0–15.0)
Immature Granulocytes: 1 %
Lymphocytes Relative: 15 %
Lymphs Abs: 0.7 10*3/uL (ref 0.7–4.0)
MCH: 25.6 pg — ABNORMAL LOW (ref 26.0–34.0)
MCHC: 30.4 g/dL (ref 30.0–36.0)
MCV: 84.2 fL (ref 80.0–100.0)
Monocytes Absolute: 0.3 10*3/uL (ref 0.1–1.0)
Monocytes Relative: 5 %
Neutro Abs: 3.9 10*3/uL (ref 1.7–7.7)
Neutrophils Relative %: 79 %
Platelets: 264 10*3/uL (ref 150–400)
RBC: 4.49 MIL/uL (ref 3.87–5.11)
RDW: 17.6 % — ABNORMAL HIGH (ref 11.5–15.5)
WBC: 4.9 10*3/uL (ref 4.0–10.5)
nRBC: 0 % (ref 0.0–0.2)

## 2023-05-02 LAB — BASIC METABOLIC PANEL
Anion gap: 13 (ref 5–15)
BUN: 58 mg/dL — ABNORMAL HIGH (ref 8–23)
CO2: 28 mmol/L (ref 22–32)
Calcium: 10.1 mg/dL (ref 8.9–10.3)
Chloride: 99 mmol/L (ref 98–111)
Creatinine, Ser: 5.72 mg/dL — ABNORMAL HIGH (ref 0.44–1.00)
GFR, Estimated: 8 mL/min — ABNORMAL LOW (ref >60)
Glucose, Bld: 279 mg/dL — ABNORMAL HIGH (ref 70–99)
Potassium: 4.8 mmol/L (ref 3.5–5.1)
Sodium: 140 mmol/L (ref 135–145)

## 2023-05-02 LAB — GLUCOSE, CAPILLARY
Glucose-Capillary: 251 mg/dL — ABNORMAL HIGH (ref 70–99)
Glucose-Capillary: 218 mg/dL — ABNORMAL HIGH (ref 70–99)

## 2023-05-02 LAB — HEPATITIS B SURFACE ANTIBODY, QUANTITATIVE: Hep B S AB Quant (Post): 262 m[IU]/mL

## 2023-05-02 MED ORDER — PREDNISONE 50 MG PO TABS: 50.0000 mg | ORAL_TABLET | Freq: Every day | ORAL | 0 refills | Status: AC

## 2023-05-02 MED ORDER — LEVOFLOXACIN 500 MG PO TABS: 500.0000 mg | ORAL_TABLET | ORAL | 0 refills | Status: AC

## 2023-05-02 MED ORDER — INSULIN GLARGINE-YFGN 100 UNIT/ML ~~LOC~~ SOLN
40.0000 [IU] | Freq: Every day | SUBCUTANEOUS | Status: DC
  Administered 2023-05-02: 40 [IU] via SUBCUTANEOUS
  Filled 2023-05-02 (×2): qty 0.4

## 2023-05-02 NOTE — Discharge Summary (Signed)
 Physician Discharge Summary  Kathryn Beck FMW:[id number] DOB: [birth year] DOA: 04/30/2023  PCP: Antonetta Rollene BRAVO, MD  Admit date: 04/30/2023 Discharge date: 05/02/2023 30 Day Unplanned Readmission Risk Score    Flowsheet Row ED to Hosp-Admission (Current) from 04/30/2023 in [facility] SURGICAL UNIT  30 Day Unplanned Readmission Risk Score (%) 43.33 Filed at 05/02/2023 0801       This score is the patient's risk of an unplanned readmission within 30 days of being discharged (0 -100%). The score is based on dignosis, age, lab data, medications, orders, and past utilization.   Low:  0-14.9   Medium: 15-21.9   High: 22-29.9   Extreme: 30 and above          Admitted From: Home Disposition: Home  Recommendations for Outpatient Follow-up:  Follow up with PCP in 1-2 weeks Please obtain BMP/CBC in one week Please follow up with your PCP on the following pending results: Unresulted Labs (From admission, onward)     Start     Ordered   05/01/23 0500  Hepatitis B surface antibody,quantitative  Once,   AD        05/01/23 0500              Home Health: None Equipment/Devices: None  Discharge Condition: Stable CODE STATUS: Full code Diet recommendation: Renal and diabetic  Subjective: Seen and examined.  Feels better.  No shortness of breath.  Comfortable going home today.  Brief/Interim Summary: Kathryn Beck is a 68 y.o. female with medical history significant of end-stage renal disease on dialysis Monday, Wednesday, Friday, heart failure with preserved EF, diabetes, history of bilateral lower EXTR amputations, sleep apnea, obesity.  Patient presented with cough, shortness of breath that has been ongoing over the past couple of days.  She denied fevers, chills, nausea, vomiting.  No known sick contacts.  She saw her primary care physician a few days ago and was prescribed azithromycin , but then switched to doxycycline .  She had no improvements.  She did miss dialysis  earlier on day of admission.  She does not produce much urine, but still takes torsemide  which she has been compliant with.  Patient was admitted with following.   Acute hypoxic respiratory failure secondary to RSV bronchiolitis and possible community-acquired pneumonia, superinfection, POA: Patient was saturating 89% on room air requiring 2 L of oxygen .  Diagnosed with RSV positive.  Chest x-ray shows pulmonary vascular congestion, no clear-cut infiltrates but procalcitonin elevated.  Does appear to have bacterial pneumonia clinically as well.  She is allergic to bronchodilators/albuterol  and Xopenex .  She was wheezy so she was started on Solu-Medrol .  She has been weaned to room air since yesterday.  Still has very minimal end expiratory wheezes but feels comfortable going home and she is not hypoxic.  She was transition to oral Levaquin  after consultation with the pharmacy and she will be discharged on 7 more days of oral Levaquin  along with few days of prednisone .   ESRD: Patient is on MWF schedule.  She missed her dialysis on day of admission, nephrology consulted, patient received her dialysis yesterday.   Hypokalemia: Resolved   Type 2 diabetes mellitus: Patient's PTA regimen of Lantus  30 units but she was hyperglycemic today so she was given 40 units of Lantus .  After discharge and will resume PTA medications.   Essential hypertension: PTA medications include amlodipine  10 mg, hydralazine  25 mg twice daily, Toprol -XL 50 mg.  All medications have been resumed   History of CAD:  Asymptomatic.  Continue Plavix .   Diabetic polyneuropathy: Continue Cymbalta .  Generalized weakness: PT OT consulted, per PT note Pt is B amputee, son is in the room and states that they pick her up and put her into the wheelchair. States she has been shown how to use a transfer board but they found this to be easier. States that he does not feel they need any skilled therapy at this time   Discharge plan was  discussed with patient and/or family member and they verbalized understanding and agreed with it.  Discharge Diagnoses:  Principal Problem:   RSV (acute bronchiolitis due to respiratory syncytial virus) Active Problems:   ESRD on hemodialysis (HCC)   HTN (hypertension)   Chronic diastolic (congestive) heart failure (HCC)   Diabetes mellitus (HCC)    Discharge Instructions   Allergies as of 05/02/2023       Reactions   Ace Inhibitors Anaphylaxis, Swelling   Penicillins Itching, Swelling   Statins Other (See Comments)   elevated LFT's   Albuterol  Swelling        Medication List     TAKE these medications    amLODipine  10 MG tablet Commonly known as: NORVASC  Take 1 tablet (10 mg total) by mouth daily.   aspirin  81 MG chewable tablet Chew 2 tablets (162 mg total) by mouth daily.   benzonatate  100 MG capsule Commonly known as: TESSALON  Take one cpsule once daily for cough and congestion, as meeded   calcium  acetate 667 MG tablet Commonly known as: PHOSLO  Take 667 mg by mouth 3 (three) times daily.   cinacalcet  30 MG tablet Commonly known as: SENSIPAR  Take 3 tablets (90 mg total) by mouth 3 (three) times a week.   clopidogrel  75 MG tablet Commonly known as: PLAVIX  Take 1 tablet (75 mg total) by mouth daily.   doxycycline  100 MG tablet Commonly known as: VIBRA -TABS Take 1 tablet (100 mg total) by mouth 2 (two) times daily for 7 days.   DULoxetine  60 MG capsule Commonly known as: CYMBALTA  TAKE (1) CAPSULE BY MOUTH ONCE DAILY.   ezetimibe  10 MG tablet Commonly known as: ZETIA  Take 1 tablet (10 mg total) by mouth daily.   Fiasp  FlexTouch 100 UNIT/ML FlexTouch Pen Generic drug: insulin  aspart INJECT 18 TO 30 UNITS INTO THE SKIN WITH BREAKFAST, WITH LUNCH, AND WITH EVENING MEAL   hydrALAZINE  25 MG tablet Commonly known as: APRESOLINE  Take 25 mg by mouth 2 (two) times daily.   levofloxacin  500 MG tablet Commonly known as: LEVAQUIN  Take 1 tablet (500 mg  total) by mouth every other day for 8 days. Start taking on: May 03, 2023   metoprolol  succinate 50 MG 24 hr tablet Commonly known as: TOPROL -XL TAKE (1) TABLET BY MOUTH DAILY WITH FOOD *TAKE AFTER DIALYSIS*   OneTouch Verio test strip Generic drug: glucose blood Use as instructed to check blood sugar 4X daily.   predniSONE  50 MG tablet Commonly known as: DELTASONE  Take 1 tablet (50 mg total) by mouth daily with breakfast for 4 days.   Rena-Vite Rx 1 MG Tabs Take 1 tablet by mouth daily.   sertraline  50 MG tablet Commonly known as: ZOLOFT  Take 50 mg by mouth daily.   torsemide  100 MG tablet Commonly known as: DEMADEX  Take 100 mg by mouth See admin instructions. Take in Tues,Thursday,Sat & Sun   Toujeo  Max SoloStar 300 UNIT/ML Solostar Pen Generic drug: insulin  glargine (2 Unit Dial ) Inject 30 Units into the skin daily. May need to slowly increase the dose  depending upon your blood sugar, follow-up with PCP   [name]  500 MG chewable tablet Generic drug: sucroferric oxyhydroxide Chew 1-2 tablets (500-1,000 mg total) by mouth See admin instructions. Take 1000mg  (2 tablets) by mouth with meals and 500mg  (1 tablet) with snacks        Follow-up Information     Antonetta Rollene BRAVO, MD Follow up in 1 week(s).   Specialty: Family Medicine Contact information: [address] [place] [address] [phone]                Allergies  Allergen Reactions   Ace Inhibitors Anaphylaxis and Swelling   Penicillins Itching and Swelling   Statins Other (See Comments)    elevated LFT's     Albuterol  Swelling    Consultations: None   Procedures/Studies: DG Chest Port 1 View Result Date: 04/30/2023 CLINICAL DATA:  Shortness of breath.  Weakness. EXAM: PORTABLE CHEST 1 VIEW COMPARISON:  04/27/2023. FINDINGS: Low lung volume. There is left retrocardiac airspace opacity obscuring the left hemidiaphragm, descending thoracic aorta and blunting the left  lateral costophrenic angle, suggesting combination of left lung atelectasis and/or consolidation with pleural effusion. Moderate increased bronchovascular markings noted with hilar and lower lobe predominance, which is likely slightly accentuated by low lung volume. Bilateral lung fields are otherwise clear. Right lateral costophrenic angle is clear. Stable mildly enlarged cardio-mediastinal silhouette. No acute osseous abnormalities. The soft tissues are within normal limits. IMPRESSION: *Cardiomegaly with moderate increased bronchovascular markings, favor congestive heart failure/pulmonary edema. *There is associated left retrocardiac opacity, which may represent combination of left lung atelectasis and/or consolidation with pleural effusion. Electronically Signed   By: Ree Molt M.D.   On: 04/30/2023 15:13   DG Chest 2 View Result Date: 04/27/2023 CLINICAL DATA:  Three-week history of cough EXAM: CHEST - 2 VIEW COMPARISON:  Chest radiograph dated 12/28/2022 FINDINGS: Low lung volumes with bronchovascular crowding. Increased diffuse opacification of the left lung. Blunting of the left costophrenic angle. No pneumothorax. Similar enlarged cardiomediastinal silhouette. No acute osseous abnormality. IMPRESSION: 1. Increased diffuse opacification of the left lung, which may represent a combination of atelectasis, edema, and/or pneumonia. 2. Blunting of the left costophrenic angle, which may represent a small pleural effusion. Electronically Signed   By: Limin  Xu M.D.   On: 04/27/2023 11:42     Discharge Exam: Vitals:   05/02/23 0857 05/02/23 0913  BP: (!) 160/77   Pulse: 79   Resp:    Temp:    SpO2:  99%   Vitals:   05/01/23 1942 05/02/23 0433 05/02/23 0857 05/02/23 0913  BP: 139/62 (!) 155/76 (!) 160/77   Pulse: (!) 102 86 79   Resp: 20 18    Temp: 98.1 F (36.7 C) 98.2 F (36.8 C)    TempSrc: Oral Oral    SpO2: 92% 95%  99%  Weight:        General: Pt is alert, awake, not in acute  distress Cardiovascular: RRR, S1/S2 +, no rubs, no gallops Respiratory: Minimal end expiratory wheezes, no rales. Abdominal: Soft, NT, ND, bowel sounds + Extremities: Bilateral BKA    The results of significant diagnostics from this hospitalization (including imaging, microbiology, ancillary and laboratory) are listed below for reference.     Microbiology: Recent Results (from the past 240 hours)  Stool culture     Status: None (Preliminary result)   Collection Time: 04/30/23 12:00 PM   Specimen: Stool   SP     CD- [id number]  Result  Value Ref Range Status   Salmonella/Shigella Screen WILL FOLLOW  Preliminary   Campylobacter Culture WILL FOLLOW  Preliminary   E coli, Shiga toxin Assay WILL FOLLOW  Preliminary  Gram Stain w/Sputum Cult Rflx     Status: None (Preliminary result)   Collection Time: 04/30/23 12:00 PM   Specimen: Sputum   SP     CD- [id number]  Result Value Ref Range Status   White Blood Cells None seen  Final   Epithelial Cells Few  Final   Result 1 Comment  Final    Comment: Many gram positive cocci.   Result 2 Comment  Final    Comment: Many gram negative rods.   Result 3 Comment  Final    Comment: Few gram positive rods.   Gram Stain Evaluation Comment  Final    Comment: This specimen is of good quality and is acceptable for routine bacterial culture.   Sputum Culture     Status: None (Preliminary result)   Collection Time: 04/30/23 12:00 PM   SP     CD- [id number]  Result Value Ref Range Status   Lower Respiratory Culture Preliminary report  Preliminary   Result 1 Comment  Final    Comment: Microbiological testing to rule out the presence of possible pathogens is in progress. Light growth Beta lactamase negative.    Result 2 Routine flora  Final    Comment: Moderate growth  Resp panel by RT-PCR (RSV, Flu A&B, Covid) Anterior Nasal Swab     Status: Abnormal   Collection Time: 04/30/23  1:24 PM   Specimen: Anterior Nasal Swab  Result Value Ref  Range Status   SARS Coronavirus 2 by RT PCR NEGATIVE NEGATIVE Final    Comment: (NOTE) SARS-CoV-2 target nucleic acids are NOT DETECTED.  The SARS-CoV-2 RNA is generally detectable in upper respiratory specimens during the acute phase of infection. The lowest concentration of SARS-CoV-2 viral copies this assay can detect is 138 copies/mL. A negative result does not preclude SARS-Cov-2 infection and should not be used as the sole basis for treatment or other patient management decisions. A negative result may occur with  improper specimen collection/handling, submission of specimen other than nasopharyngeal swab, presence of viral mutation(s) within the areas targeted by this assay, and inadequate number of viral copies(<138 copies/mL). A negative result must be combined with clinical observations, patient history, and epidemiological information. The expected result is Negative.  Fact Sheet for Patients:  bloggercourse.com  Fact Sheet for Healthcare Providers:  seriousbroker.it  This test is no t yet approved or cleared by the [place]  FDA and  has been authorized for detection and/or diagnosis of SARS-CoV-2 by FDA under an Emergency Use Authorization (EUA). This EUA will remain  in effect (meaning this test can be used) for the duration of the COVID-19 declaration under Section 564(b)(1) of the Act, 21 U.S.C.section 360bbb-3(b)(1), unless the authorization is terminated  or revoked sooner.       Influenza A by PCR NEGATIVE NEGATIVE Final   Influenza B by PCR NEGATIVE NEGATIVE Final    Comment: (NOTE) The Xpert Xpress SARS-CoV-2/FLU/RSV plus assay is intended as an aid in the diagnosis of influenza from Nasopharyngeal swab specimens and should not be used as a sole basis for treatment. Nasal washings and aspirates are unacceptable for Xpert Xpress SARS-CoV-2/FLU/RSV testing.  Fact Sheet for  Patients: bloggercourse.com  Fact Sheet for Healthcare Providers: seriousbroker.it  This test is not yet approved or cleared by the [place]  FDA and  has been authorized for detection and/or diagnosis of SARS-CoV-2 by FDA under an Emergency Use Authorization (EUA). This EUA will remain in effect (meaning this test can be used) for the duration of the COVID-19 declaration under Section 564(b)(1) of the Act, 21 U.S.C. section 360bbb-3(b)(1), unless the authorization is terminated or revoked.     Resp Syncytial Virus by PCR POSITIVE (A) NEGATIVE Final    Comment: (NOTE) Fact Sheet for Patients: bloggercourse.com  Fact Sheet for Healthcare Providers: seriousbroker.it  This test is not yet approved or cleared by the [place]  FDA and has been authorized for detection and/or diagnosis of SARS-CoV-2 by FDA under an Emergency Use Authorization (EUA). This EUA will remain in effect (meaning this test can be used) for the duration of the COVID-19 declaration under Section 564(b)(1) of the Act, 21 U.S.C. section 360bbb-3(b)(1), unless the authorization is terminated or revoked.  Performed at [facility], [address], [place], [place] [address]   MRSA Next Gen by PCR, Nasal     Status: None   Collection Time: 04/30/23  6:35 PM   Specimen: Nasal Mucosa; Nasal Swab  Result Value Ref Range Status   MRSA by PCR Next Gen NOT DETECTED NOT DETECTED Final    Comment: (NOTE) The GeneXpert MRSA Assay (FDA approved for NASAL specimens only), is one component of a comprehensive MRSA colonization surveillance program. It is not intended to diagnose MRSA infection nor to guide or monitor treatment for MRSA infections. Test performance is not FDA approved in patients less than 29 years old. Performed at [facility], [address] [place], [place] [address]      Labs: BNP (last 3  results) Recent Labs    12/28/22 0842  BNP 2,982.0*   Basic Metabolic Panel: Recent Labs  Lab 04/30/23 1330 05/01/23 0423 05/02/23 0706  NA 143 136 140  K 4.3 3.3* 4.8  CL 102 96* 99  CO2 25 28 28   GLUCOSE 175* 144* 279*  BUN 57* 25* 58*  CREATININE 6.74* 3.41* 5.72*  CALCIUM  9.8 9.1 10.1   Liver Function Tests: No results for input(s): AST, ALT, ALKPHOS, BILITOT, PROT, ALBUMIN  in the last 168 hours. No results for input(s): LIPASE, AMYLASE in the last 168 hours. No results for input(s): AMMONIA in the last 168 hours. CBC: Recent Labs  Lab 04/30/23 1330 05/02/23 0506  WBC 5.4 4.9  NEUTROABS 3.7 3.9  HGB 11.2* 11.5*  HCT 35.9* 37.8  MCV 82.3 84.2  PLT 162 264   Cardiac Enzymes: No results for input(s): CKTOTAL, CKMB, CKMBINDEX, TROPONINI in the last 168 hours. BNP: Invalid input(s): POCBNP CBG: Recent Labs  Lab 05/01/23 1121 05/01/23 1620 05/01/23 2132 05/02/23 0724 05/02/23 1111  GLUCAP 220* 246* 358* 251* 218*   D-Dimer No results for input(s): DDIMER in the last 72 hours. Hgb A1c No results for input(s): HGBA1C in the last 72 hours. Lipid Profile No results for input(s): CHOL, HDL, LDLCALC, TRIG, CHOLHDL, LDLDIRECT in the last 72 hours. Thyroid  function studies No results for input(s): TSH, T4TOTAL, T3FREE, THYROIDAB in the last 72 hours.  Invalid input(s): FREET3 Anemia work up No results for input(s): VITAMINB12, FOLATE, FERRITIN, TIBC, IRON, RETICCTPCT in the last 72 hours. Urinalysis    Component Value Date/Time   COLORURINE YELLOW 11/07/2017 2340   APPEARANCEUR HAZY (A) 11/07/2017 2340   LABSPEC 1.017 11/07/2017 2340   PHURINE 5.0 11/07/2017 2340   GLUCOSEU 150 (A) 11/07/2017 2340   HGBUR NEGATIVE 11/07/2017 2340   BILIRUBINUR NEGATIVE 11/07/2017 2340  BILIRUBINUR neg 02/15/2017 0913   KETONESUR NEGATIVE 11/07/2017 2340   PROTEINUR >=300 (A) 11/07/2017 2340    UROBILINOGEN 0.2 02/15/2017 0913   NITRITE NEGATIVE 11/07/2017 2340   LEUKOCYTESUR NEGATIVE 11/07/2017 2340   Sepsis Labs Recent Labs  Lab 04/30/23 1330 05/02/23 0506  WBC 5.4 4.9   Microbiology Recent Results (from the past 240 hours)  Stool culture     Status: None (Preliminary result)   Collection Time: 04/30/23 12:00 PM   Specimen: Stool   SP     CD- [id number] V  Result Value Ref Range Status   Salmonella/Shigella Screen WILL FOLLOW  Preliminary   Campylobacter Culture WILL FOLLOW  Preliminary   E coli, Shiga toxin Assay WILL FOLLOW  Preliminary  Gram Stain w/Sputum Cult Rflx     Status: None (Preliminary result)   Collection Time: 04/30/23 12:00 PM   Specimen: Sputum   SP     CD- [id number] V  Result Value Ref Range Status   White Blood Cells None seen  Final   Epithelial Cells Few  Final   Result 1 Comment  Final    Comment: Many gram positive cocci.   Result 2 Comment  Final    Comment: Many gram negative rods.   Result 3 Comment  Final    Comment: Few gram positive rods.   Gram Stain Evaluation Comment  Final    Comment: This specimen is of good quality and is acceptable for routine bacterial culture.   Sputum Culture     Status: None (Preliminary result)   Collection Time: 04/30/23 12:00 PM   SP     CD- [id number] V  Result Value Ref Range Status   Lower Respiratory Culture Preliminary report  Preliminary   Result 1 Comment  Final    Comment: Microbiological testing to rule out the presence of possible pathogens is in progress. Light growth Beta lactamase negative.    Result 2 Routine flora  Final    Comment: Moderate growth  Resp panel by RT-PCR (RSV, Flu A&B, Covid) Anterior Nasal Swab     Status: Abnormal   Collection Time: 04/30/23  1:24 PM   Specimen: Anterior Nasal Swab  Result Value Ref Range Status   SARS Coronavirus 2 by RT PCR NEGATIVE NEGATIVE Final    Comment: (NOTE) SARS-CoV-2 target nucleic acids are NOT DETECTED.  The SARS-CoV-2 RNA is  generally detectable in upper respiratory specimens during the acute phase of infection. The lowest concentration of SARS-CoV-2 viral copies this assay can detect is 138 copies/mL. A negative result does not preclude SARS-Cov-2 infection and should not be used as the sole basis for treatment or other patient management decisions. A negative result may occur with  improper specimen collection/handling, submission of specimen other than nasopharyngeal swab, presence of viral mutation(s) within the areas targeted by this assay, and inadequate number of viral copies(<138 copies/mL). A negative result must be combined with clinical observations, patient history, and epidemiological information. The expected result is Negative.  Fact Sheet for Patients:  bloggercourse.com  Fact Sheet for Healthcare Providers:  seriousbroker.it  This test is no t yet approved or cleared by the [place]  FDA and  has been authorized for detection and/or diagnosis of SARS-CoV-2 by FDA under an Emergency Use Authorization (EUA). This EUA will remain  in effect (meaning this test can be used) for the duration of the COVID-19 declaration under Section 564(b)(1) of the Act, 21 U.S.C.section 360bbb-3(b)(1), unless the authorization is terminated  or revoked sooner.  Influenza A by PCR NEGATIVE NEGATIVE Final   Influenza B by PCR NEGATIVE NEGATIVE Final    Comment: (NOTE) The Xpert Xpress SARS-CoV-2/FLU/RSV plus assay is intended as an aid in the diagnosis of influenza from Nasopharyngeal swab specimens and should not be used as a sole basis for treatment. Nasal washings and aspirates are unacceptable for Xpert Xpress SARS-CoV-2/FLU/RSV testing.  Fact Sheet for Patients: bloggercourse.com  Fact Sheet for Healthcare Providers: seriousbroker.it  This test is not yet approved or cleared by the [place] FDA and has been authorized for detection and/or diagnosis of SARS-CoV-2 by FDA under an Emergency Use Authorization (EUA). This EUA will remain in effect (meaning this test can be used) for the duration of the COVID-19 declaration under Section 564(b)(1) of the Act, 21 U.S.C. section 360bbb-3(b)(1), unless the authorization is terminated or revoked.     Resp Syncytial Virus by PCR POSITIVE (A) NEGATIVE Final    Comment: (NOTE) Fact Sheet for Patients: bloggercourse.com  Fact Sheet for Healthcare Providers: seriousbroker.it  This test is not yet approved or cleared by the [place]  FDA and has been authorized for detection and/or diagnosis of SARS-CoV-2 by FDA under an Emergency Use Authorization (EUA). This EUA will remain in effect (meaning this test can be used) for the duration of the COVID-19 declaration under Section 564(b)(1) of the Act, 21 U.S.C. section 360bbb-3(b)(1), unless the authorization is terminated or revoked.  Performed at [facility], [address], [place], [place] [address]   MRSA Next Gen by PCR, Nasal     Status: None   Collection Time: 04/30/23  6:35 PM   Specimen: Nasal Mucosa; Nasal Swab  Result Value Ref Range Status   MRSA by PCR Next Gen NOT DETECTED NOT DETECTED Final    Comment: (NOTE) The GeneXpert MRSA Assay (FDA approved for NASAL specimens only), is one component of a comprehensive MRSA colonization surveillance program. It is not intended to diagnose MRSA infection nor to guide or monitor treatment for MRSA infections. Test performance is not FDA approved in patients less than 25 years old. Performed at [facility], [address], [place], [place] [address]     FURTHER DISCHARGE INSTRUCTIONS:   Get Medicines reviewed and adjusted: Please take all your medications with you for your next visit with your Primary MD   Laboratory/radiological data: Please request your Primary  MD to go over all hospital tests and procedure/radiological results at the follow up, please ask your Primary MD to get all Hospital records sent to his/her office.   In some cases, they will be blood work, cultures and biopsy results pending at the time of your discharge. Please request that your primary care M.D. goes through all the records of your hospital data and follows up on these results.   Also Note the following: If you experience worsening of your admission symptoms, develop shortness of breath, life threatening emergency, suicidal or homicidal thoughts you must seek medical attention immediately by calling 911 or calling your MD immediately  if symptoms less severe.   You must read complete instructions/literature along with all the possible adverse reactions/side effects for all the Medicines you take and that have been prescribed to you. Take any new Medicines after you have completely understood and accpet all the possible adverse reactions/side effects.    Do not drive when taking Pain medications or sleeping medications (Benzodaizepines)   Do not take more than prescribed Pain, Sleep and Anxiety Medications. It is not advisable to combine anxiety,sleep  and pain medications without talking with your primary care practitioner   Special Instructions: If you have smoked or chewed Tobacco  in the last 2 yrs please stop smoking, stop any regular Alcohol  and or any Recreational drug use.   Wear Seat belts while driving.   Please note: You were cared for by a hospitalist during your hospital stay. Once you are discharged, your primary care physician will handle any further medical issues. Please note that NO REFILLS for any discharge medications will be authorized once you are discharged, as it is imperative that you return to your primary care physician (or establish a relationship with a primary care physician if you do not have one) for your post hospital discharge needs so that they  can reassess your need for medications and monitor your lab values  Time coordinating discharge: Over 30 minutes  SIGNED:   Fredia Skeeter, MD  [facility] 05/02/2023, 11:19 AM *Please note that this is a verbal dictation therefore any spelling or grammatical errors are due to the Dragon Medical One system interpretation. If 7PM-7AM, please contact night-coverage [email or web address]

## 2023-05-02 NOTE — Plan of Care (Signed)

## 2023-05-02 NOTE — Therapy (Signed)
 Pt screened:  Pt is B amputee, son is in the room and states that they pick her up and put her into the wheelchair.  States she has been shown how to use a transfer board but they found this to be easier.  States that he does not feel they need any skilled therapy at this time.  Montie Metro, PT CLT [phone]

## 2023-05-03 ENCOUNTER — Ambulatory Visit: Payer: Self-pay | Admitting: *Deleted

## 2023-05-03 ENCOUNTER — Encounter: Payer: Self-pay | Admitting: *Deleted

## 2023-05-03 ENCOUNTER — Telehealth: Payer: Self-pay

## 2023-05-03 ENCOUNTER — Encounter: Payer: Self-pay | Admitting: Family Medicine

## 2023-05-03 DIAGNOSIS — D631 Anemia in chronic kidney disease: Secondary | ICD-10-CM | POA: Diagnosis not present

## 2023-05-03 DIAGNOSIS — N2581 Secondary hyperparathyroidism of renal origin: Secondary | ICD-10-CM | POA: Diagnosis not present

## 2023-05-03 DIAGNOSIS — N250 Renal osteodystrophy: Secondary | ICD-10-CM | POA: Diagnosis not present

## 2023-05-03 DIAGNOSIS — D509 Iron deficiency anemia, unspecified: Secondary | ICD-10-CM | POA: Diagnosis not present

## 2023-05-03 DIAGNOSIS — Z992 Dependence on renal dialysis: Secondary | ICD-10-CM | POA: Diagnosis not present

## 2023-05-03 DIAGNOSIS — N186 End stage renal disease: Secondary | ICD-10-CM | POA: Diagnosis not present

## 2023-05-03 NOTE — Transitions of Care (Post Inpatient/ED Visit) (Signed)
   05/03/2023  Name: Kathryn Beck MRN: [id number] DOB: [birth year]  Today's TOC FU Call Status: Today's TOC FU Call Status:: Unsuccessful Call (2nd Attempt) Unsuccessful Call (1st Attempt) Date: 05/03/23 Unsuccessful Call (2nd Attempt) Date: 05/03/23  Attempted to reach the patient regarding the most recent Inpatient/ED visit.  Follow Up Plan: Additional outreach attempts will be made to reach the patient to complete the Transitions of Care (Post Inpatient/ED visit) call.    Orpha Blade, RN, BSN, CEN [employer]- Transition of Care Team.  [facility] [phone]

## 2023-05-03 NOTE — Patient Instructions (Signed)
 Visit Information  Thank you for taking time to visit with me today. Please don't hesitate to contact me if I can be of assistance to you.   Following are the goals we discussed today:   Goals Addressed               This Visit's Progress     Receive Counseling & Supportive Services to Help Cope with Chronic Illnesses. (pt-stated)   On track     Care Coordination Interventions:  Interventions Today    Flowsheet Row Most Recent Value  Chronic Disease   Chronic disease during today's visit Diabetes, Congestive Heart Failure (CHF), Hypertension (HTN), Chronic Kidney Disease/End Stage Renal Disease (ESRD), Other  [Shortness of Breath, Receives Dialysis, Nasal Congestion, Pneumonia, Bilatereal Below Knee Amputation, Restricted Left Arm, Neuropathy, Chronic Pain Syndrome, Anxiety, Depression, Memory Loss, Neurocognitive Disorder.]  General Interventions   General Interventions Discussed/Reviewed General Interventions Discussed, General Interventions Reviewed, Doctor Visits, Communication with  [Encouraged Routine Engagement with Care Team Members & Providers.]  Doctor Visits Discussed/Reviewed Doctor Visits Discussed, Specialist, Doctor Visits Reviewed, Annual Wellness Visits, PCP  [Encouraged Routine Engagement with Care Team Members & Providers.]  PCP/Specialist Visits Compliance with follow-up visit  [Encouraged Routine Engagement with Care Team Members & Providers.]  Communication with PCP/Specialists, RN, Pharmacists, Social Work  [employer] Routine Engagement with Care Team Members & Providers.]  Level of Care Adult Daycare, Assisted Living, Skilled Nursing Facility  [Confirmed Disinterest in Enrollment in Adult Day Care Program or Receiving Assistance Pursuing Higher Level of Care Placement Options (I.e Assisted Living Versus Skilled Nursing Facility).]  Applications Medicaid, Personal Care Services  [Confirmed Disinterest in Receiving Assistance Applying for Medicaid, Personal Care  Services or Aid & Attendance Benefits.]  Education Interventions   Education Provided Provided Education  [Thoroughly Reviewed Educational Material to Ensure Understanding & Entertain Questions.]  Provided Verbal Education On When to see the doctor, Community Resources, Mental Health/Coping with Illness  [Encouraged Continued Independent Review of Educational Material Provided.]  Applications Medicaid, Personal Care Services  [Confirmed Disinterest in Receiving Assistance Applying for Medicaid, Personal Care Services or Aid & Attendance Benefits.]  Mental Health Interventions   Mental Health Discussed/Reviewed Mental Health Discussed, Anxiety, Depression, Grief and Loss, Mental Health Reviewed, Substance Abuse, Coping Strategies, Suicide, Crisis, Other  [Assessed Mental Health & Cognitive Status.]        Active Listening & Reflection Utilized.  Verbalization of Feelings Encouraged. Emotional Support Provided. Cognitive Behavioral Therapy Performed. Client-Centered Therapy Initiated. Acceptance & Commitment Therapy Indicated. CSW Collaboration with Daughter, [name] to Encourage Routine Engagement with Timmie Footman, Licensed Clinical Social Worker with [facility] [phone]), if She Has Questions, Needs Assistance, or If Additional Social Work Needs Are Identified Between Now & Our Next Follow-Up Outreach Call, Scheduled on 06/07/2023 at 9:00 AM.      Our next appointment is by telephone on 06/07/2023 at 9:00 am.  Please call the care guide team at [phone] if you need to cancel or reschedule your appointment.   If you are experiencing a Mental Health or Behavioral Health Crisis or need someone to talk to, please call the Suicide and Crisis Lifeline: 988 call the [place]  National Suicide Prevention Lifeline: 603-613-5785 or TTY: (289) 318-0835 TTY 573-473-5373) to talk to a trained counselor call 1-800-273-TALK (toll free, 24 hour hotline) go to [facility] [address], [place] [phone]) call the [facility] Crisis Line: [phone] call 911  Patient verbalizes understanding of instructions and care plan  provided today and agrees to view in MyChart. Active MyChart status and patient understanding of how to access instructions and care plan via MyChart confirmed with patient.     Telephone follow up appointment with care management team member scheduled for:  06/07/2023 at 9:00 am.   Timmie Footman, BSW, MSW, LCSW [place]  [facility] Clinical Social Worker II Direct Dial : [phone]  Fax: [phone] Website: [email or web address]

## 2023-05-03 NOTE — Patient Outreach (Signed)
 Care Coordination   Follow Up Visit Note   05/03/2023  Name: Kathryn Beck MRN: [id number] DOB: January 22, 1956  Kathryn Beck is a 68 y.o. year old female who sees Kathryn Fret, MD for primary care. I spoke with Kathryn Beck and daughter, Kathryn Beck by phone today.  What matters to the patients health and wellness today?  Receive Counseling & Supportive Services to Help Cope with Chronic Illnesses.    Goals Addressed               This Visit's Progress     Receive Counseling & Supportive Services to Help Cope with Chronic Illnesses. (pt-stated)   On track     Care Coordination Interventions:  Interventions Today    Flowsheet Row Most Recent Value  Chronic Disease   Chronic disease during today's visit Diabetes, Congestive Heart Failure (CHF), Hypertension (HTN), Chronic Kidney Disease/End Stage Renal Disease (ESRD), Other  [Shortness of Breath, Receives Dialysis, Nasal Congestion, Pneumonia, Bilatereal Below Knee Amputation, Restricted Left Arm, Neuropathy, Chronic Pain Syndrome, Anxiety, Depression, Memory Loss, Neurocognitive Disorder.]  General Interventions   General Interventions Discussed/Reviewed General Interventions Discussed, General Interventions Reviewed, Doctor Visits, Communication with  [Encouraged Routine Engagement with Care Team Members & Providers.]  Doctor Visits Discussed/Reviewed Doctor Visits Discussed, Specialist, Doctor Visits Reviewed, Annual Wellness Visits, PCP  [Encouraged Routine Engagement with Care Team Members & Providers.]  PCP/Specialist Visits Compliance with follow-up visit  [Encouraged Routine Engagement with Care Team Members & Providers.]  Communication with PCP/Specialists, RN, Pharmacists, Social Work  [employer] Routine Engagement with Care Team Members & Providers.]  Level of Care Adult Daycare, Assisted Living, Skilled Nursing Facility  [Confirmed Disinterest in Enrollment in Adult Day Care Program or Receiving Assistance  Pursuing Higher Level of Care Placement Options (I.e Assisted Living Versus Skilled Nursing Facility).]  Applications Medicaid, Personal Care Services  [Confirmed Disinterest in Receiving Assistance Applying for Medicaid, Personal Care Services or Aid & Attendance Benefits.]  Education Interventions   Education Provided Provided Education  [Thoroughly Reviewed Educational Material to Ensure Understanding & Entertain Questions.]  Provided Verbal Education On When to see the doctor, Community Resources, Mental Health/Coping with Illness  [Encouraged Continued Independent Review of Educational Material Provided.]  Applications Medicaid, Personal Care Services  [Confirmed Disinterest in Receiving Assistance Applying for Medicaid, Personal Care Services or Aid & Attendance Benefits.]  Mental Health Interventions   Mental Health Discussed/Reviewed Mental Health Discussed, Anxiety, Depression, Grief and Loss, Mental Health Reviewed, Substance Abuse, Coping Strategies, Suicide, Crisis, Other  [Assessed Mental Health & Cognitive Status.]        Active Listening & Reflection Utilized.  Verbalization of Feelings Encouraged. Emotional Support Provided. Cognitive Behavioral Therapy Performed. Client-Centered Therapy Initiated. Acceptance & Commitment Therapy Indicated. CSW Collaboration with Daughter, Kathryn Beck to Encourage Routine Engagement with Kathryn Beck, Licensed Clinical Social Worker with [facility] [phone]), if She Has Questions, Needs Assistance, or If Additional Social Work Needs Are Identified Between Now & Our Next Follow-Up Outreach Call, Scheduled on 06/07/2023 at 9:00 AM.        SDOH assessments and interventions completed:  Yes.  SDOH Interventions Today    Flowsheet Row Most Recent Value  SDOH Interventions   Food Insecurity Interventions Intervention Not Indicated  Housing Interventions Intervention Not Indicated  Transportation Interventions  Intervention Not Indicated, Patient Resources (Friends/Family), Payor Benefit  Utilities Interventions Intervention Not Indicated  Alcohol Usage Interventions Intervention Not Indicated (Score <7)  Depression Interventions/Treatment  Medication, Counseling, Currently on Treatment,  Community Resources Provided  Financial Strain Interventions Intervention Not Indicated  Physical Activity Interventions Community Resources Provided, Patient Declined, Intervention Not Indicated  Stress Interventions Intervention Not Indicated  Social Connections Interventions Intervention Not Indicated  Health Literacy Interventions Intervention Not Indicated     Care Coordination Interventions:  Yes, provided.   Follow up plan: Follow up call scheduled for 06/07/2023 at 9:00 am.  Encounter Outcome:  Patient Visit Completed.   Kathryn Beck, BSW, MSW, LCSW [facility], [facility] Clinical Social Worker II Direct Dial : [phone]  Fax: [phone] Website: [email or web address]

## 2023-05-04 ENCOUNTER — Encounter: Payer: Self-pay | Admitting: Family Medicine

## 2023-05-04 DIAGNOSIS — J189 Pneumonia, unspecified organism: Secondary | ICD-10-CM

## 2023-05-04 DIAGNOSIS — R197 Diarrhea, unspecified: Secondary | ICD-10-CM | POA: Insufficient documentation

## 2023-05-04 MED ORDER — AZITHROMYCIN 250 MG PO TABS: ORAL_TABLET | ORAL | 0 refills | Status: AC

## 2023-05-04 NOTE — Assessment & Plan Note (Addendum)
Managed by endo Uncontrolled Diabetes associated with hypertension, hyperlipidemia, and vascular disease  Kathryn Beck is reminded of the importance of commitment to daily physical activity for 30 minutes or more, as able and the need to limit carbohydrate intake to 30 to 60 grams per meal to help with blood sugar control.   The need to take medication as prescribed, test blood sugar as directed, and to call between visits if there is a concern that blood sugar is uncontrolled is also discussed.   Kathryn Beck is reminded of the importance of daily foot exam, annual eye examination, and good blood sugar, blood pressure and cholesterol control.     Latest Ref Rng & Units 05/02/2023    7:06 AM 05/01/2023    4:23 AM 04/30/2023    1:30 PM 04/20/2023   10:05 AM 12/28/2022    8:42 AM  Diabetic Labs  HbA1c 4.0 - 5.6 %    8.4    Creatinine 0.44 - 1.00 mg/dL 9.52  8.41  3.24   4.01       05/02/2023    8:57 AM 05/02/2023    4:33 AM 05/01/2023    7:42 PM 05/01/2023    1:00 PM 05/01/2023    5:04 AM 05/01/2023    1:00 AM 05/01/2023   12:55 AM  BP/Weight  Systolic BP 160 155 139 146 159 154 160  Diastolic BP 77 76 62 78 78 72 81  Wt. (Lbs)      163.36   BMI      28.04 kg/m2       Latest Ref Rng & Units 04/16/2022   11:20 AM 07/24/2021    3:42 PM  Foot/eye exam completion dates  Eye Exam No Retinopathy  No Retinopathy      Foot Form Completion  Done      This result is from an external source.

## 2023-05-04 NOTE — Assessment & Plan Note (Signed)
CXR , sputum for c/S , doxycycline prescribed

## 2023-05-04 NOTE — Assessment & Plan Note (Signed)
Reports hypotension on machine, blood pressure management per Nephrology

## 2023-05-04 NOTE — Assessment & Plan Note (Signed)
Refer for ENT eval

## 2023-05-04 NOTE — Assessment & Plan Note (Signed)
Stool testing for c diff and c/s

## 2023-05-04 NOTE — Assessment & Plan Note (Signed)
Reports progressive loss, has had tubes n the past, ENT eval

## 2023-05-04 NOTE — Progress Notes (Signed)
 Kathryn Beck     MRN: [id number]      DOB: 07/30/1955  Chief Complaint  Patient presents with   Cough    Cough x 3 days getting worse    HPI Kathryn Beck is here with above complaint C/o chills, no documented fever, sputum is thick C/o reduced energy and appetite Feels generally unwell  C/o hearing lossand pressure in the ears had tubes in the past C/o loose stool and diarrheah x 2 days, no n/v  ROS C/o loose stool.   . Generalized aches, and is a bilateral amputee Denies headaches, seizures, numbness, or tingling. Denies depression, anxiety or insomnia. Denies skin break down or rash.   PE  BP (!) 158/82   Pulse 94   SpO2 94%   Patient alert ill appearing HEENT: No facial asymmetry, EOMI,   No sinus tenderness. TM clear bilaterally, nasal congestion present, oropharynx norma' .  Chest: decreased air entry in base with crackles, no wheezes  CVS: S1, S2 no murmurs, no S3.Regular rate.  ABD: Soft non tender. Hyperactive BS  Ext: No edema  MS: Decreased  ROM spine, shoulders, hips and knees.  Skin: Intact, no ulcerations or rash noted.  Psych: Good eye contact, normal affect. Mildly  anxious not  depressed appearing.  CNS: CN 2-12 intact, power,  normal throughout.no focal deficits noted.   Assessment & Plan  Bronchitis CXR , sputum for c/S , doxycycline  prescribed   Diarrhea Stool testing for c diff and c/s  Diabetes mellitus (HCC) Managed by endo Uncontrolled Diabetes associated with hypertension, hyperlipidemia, and vascular disease  Kathryn Beck is reminded of the importance of commitment to daily physical activity for 30 minutes or more, as able and the need to limit carbohydrate intake to 30 to 60 grams per meal to help with blood sugar control.   The need to take medication as prescribed, test blood sugar as directed, and to call between visits if there is a concern that blood sugar is uncontrolled is also discussed.   Kathryn Beck is  reminded of the importance of daily foot exam, annual eye examination, and good blood sugar, blood pressure and cholesterol control.     Latest Ref Rng & Units 05/02/2023    7:06 AM 05/01/2023    4:23 AM 04/30/2023    1:30 PM 04/20/2023   10:05 AM 12/28/2022    8:42 AM  Diabetic Labs  HbA1c 4.0 - 5.6 %    8.4    Creatinine 0.44 - 1.00 mg/dL 4.27  6.58  3.25   3.26       05/02/2023    8:57 AM 05/02/2023    4:33 AM 05/01/2023    7:42 PM 05/01/2023    1:00 PM 05/01/2023    5:04 AM 05/01/2023    1:00 AM 05/01/2023   12:55 AM  BP/Weight  Systolic BP 160 155 139 146 159 154 160  Diastolic BP 77 76 62 78 78 72 81  Wt. (Lbs)      163.36   BMI      28.04 kg/m2       Latest Ref Rng & Units 04/16/2022   11:20 AM 07/24/2021    3:42 PM  Foot/eye exam completion dates  Eye Exam No Retinopathy  No Retinopathy      Foot Form Completion  Done      This result is from an external source.        ESRD on hemodialysis (HCC) Reports hypotension on machine,  blood pressure management per Nephrology  HTN (hypertension) DASH diet and commitment to daily physical activity for a minimum of 30 minutes discussed and encouraged, as a part of hypertension management. The importance of attaining a healthy weight is also discussed.     05/02/2023    8:57 AM 05/02/2023    4:33 AM 05/01/2023    7:42 PM 05/01/2023    1:00 PM 05/01/2023    5:04 AM 05/01/2023    1:00 AM 05/01/2023   12:55 AM  BP/Weight  Systolic BP 160 155 139 146 159 154 160  Diastolic BP 77 76 62 78 78 72 81  Wt. (Lbs)      163.36   BMI      28.04 kg/m2      No med adjustment, nephrology to manage , has hypotension at times on dialysis  Otalgia of both ears Refer for ENT eval  Hearing loss Reports progressive loss, has had tubes n the past, ENT eval

## 2023-05-04 NOTE — Assessment & Plan Note (Signed)
DASH diet and commitment to daily physical activity for a minimum of 30 minutes discussed and encouraged, as a part of hypertension management. The importance of attaining a healthy weight is also discussed.     05/02/2023    8:57 AM 05/02/2023    4:33 AM 05/01/2023    7:42 PM 05/01/2023    1:00 PM 05/01/2023    5:04 AM 05/01/2023    1:00 AM 05/01/2023   12:55 AM  BP/Weight  Systolic BP 160 155 139 146 159 154 160  Diastolic BP 77 76 62 78 78 72 81  Wt. (Lbs)      163.36   BMI      28.04 kg/m2      No med adjustment, nephrology to manage , has hypotension at times on dialysis

## 2023-05-05 ENCOUNTER — Telehealth: Payer: Self-pay

## 2023-05-05 DIAGNOSIS — N2581 Secondary hyperparathyroidism of renal origin: Secondary | ICD-10-CM | POA: Diagnosis not present

## 2023-05-05 DIAGNOSIS — D509 Iron deficiency anemia, unspecified: Secondary | ICD-10-CM | POA: Diagnosis not present

## 2023-05-05 DIAGNOSIS — D631 Anemia in chronic kidney disease: Secondary | ICD-10-CM | POA: Diagnosis not present

## 2023-05-05 DIAGNOSIS — Z992 Dependence on renal dialysis: Secondary | ICD-10-CM | POA: Diagnosis not present

## 2023-05-05 DIAGNOSIS — N250 Renal osteodystrophy: Secondary | ICD-10-CM | POA: Diagnosis not present

## 2023-05-05 DIAGNOSIS — N186 End stage renal disease: Secondary | ICD-10-CM | POA: Diagnosis not present

## 2023-05-05 NOTE — Transitions of Care (Post Inpatient/ED Visit) (Signed)
   05/05/2023  Name: Kathryn Beck MRN: [id number] DOB: 11-13-1955  Today's TOC FU Call Status: Today's TOC FU Call Status:: Successful TOC FU Call Completed TOC FU Call Complete Date: 05/05/23 Patient's Name and Date of Birth confirmed.  Transition Care Management Follow-up Telephone Call Date of Discharge: 05/02/23 [facility] Type of Discharge: Inpatient Admission Primary Inpatient Discharge Diagnosis:: RSV (acute bronchiolitis due to respiratory syncytial virus) How have you been since you were released from the hospital?: Better (Daughter reports patient is doing "fine" states "we don't need anything") Any questions or concerns?: No  Items Reviewed: Medications obtained,verified, and reconciled?: No Medications Not Reviewed Reasons::  (daughter did not wish to review medications)  Placed call to patient. Spoke with daughter, [name] who is listed with DPR. Daughter declined to complete TOC call and states she has everything she needs for patient.   Offered to review all discharge instructions and medications and daughter declined.   Daughter accepted name/number of TOC CM - Lonia Chimera, RN.   This call was completed with myself and Lonia Chimera, RN  Hilbert Odor RN, CCM   [facility] RN Care Manager [phone]

## 2023-05-06 LAB — STOOL CULTURE: E coli, Shiga toxin Assay: NEGATIVE

## 2023-05-06 LAB — SPUTUM CULTURE

## 2023-05-06 LAB — GRAM STAIN W/SPUTUM CULT RFLX: White Blood Cells: NONE SEEN

## 2023-05-08 DIAGNOSIS — D631 Anemia in chronic kidney disease: Secondary | ICD-10-CM | POA: Diagnosis not present

## 2023-05-08 DIAGNOSIS — Z992 Dependence on renal dialysis: Secondary | ICD-10-CM | POA: Diagnosis not present

## 2023-05-08 DIAGNOSIS — N2581 Secondary hyperparathyroidism of renal origin: Secondary | ICD-10-CM | POA: Diagnosis not present

## 2023-05-08 DIAGNOSIS — N186 End stage renal disease: Secondary | ICD-10-CM | POA: Diagnosis not present

## 2023-05-08 DIAGNOSIS — N250 Renal osteodystrophy: Secondary | ICD-10-CM | POA: Diagnosis not present

## 2023-05-08 DIAGNOSIS — D509 Iron deficiency anemia, unspecified: Secondary | ICD-10-CM | POA: Diagnosis not present

## 2023-05-09 ENCOUNTER — Encounter: Payer: Self-pay | Admitting: Family Medicine

## 2023-05-10 DIAGNOSIS — D509 Iron deficiency anemia, unspecified: Secondary | ICD-10-CM | POA: Diagnosis not present

## 2023-05-10 DIAGNOSIS — N186 End stage renal disease: Secondary | ICD-10-CM | POA: Diagnosis not present

## 2023-05-10 DIAGNOSIS — Z992 Dependence on renal dialysis: Secondary | ICD-10-CM | POA: Diagnosis not present

## 2023-05-10 DIAGNOSIS — N2581 Secondary hyperparathyroidism of renal origin: Secondary | ICD-10-CM | POA: Diagnosis not present

## 2023-05-10 DIAGNOSIS — N250 Renal osteodystrophy: Secondary | ICD-10-CM | POA: Diagnosis not present

## 2023-05-10 DIAGNOSIS — D631 Anemia in chronic kidney disease: Secondary | ICD-10-CM | POA: Diagnosis not present

## 2023-05-12 ENCOUNTER — Other Ambulatory Visit: Payer: Self-pay

## 2023-05-12 DIAGNOSIS — Z992 Dependence on renal dialysis: Secondary | ICD-10-CM | POA: Diagnosis not present

## 2023-05-12 DIAGNOSIS — N186 End stage renal disease: Secondary | ICD-10-CM | POA: Diagnosis not present

## 2023-05-12 DIAGNOSIS — N250 Renal osteodystrophy: Secondary | ICD-10-CM | POA: Diagnosis not present

## 2023-05-12 DIAGNOSIS — N2581 Secondary hyperparathyroidism of renal origin: Secondary | ICD-10-CM | POA: Diagnosis not present

## 2023-05-12 DIAGNOSIS — D509 Iron deficiency anemia, unspecified: Secondary | ICD-10-CM | POA: Diagnosis not present

## 2023-05-12 DIAGNOSIS — D631 Anemia in chronic kidney disease: Secondary | ICD-10-CM | POA: Diagnosis not present

## 2023-05-14 DIAGNOSIS — Z992 Dependence on renal dialysis: Secondary | ICD-10-CM | POA: Diagnosis not present

## 2023-05-14 DIAGNOSIS — D631 Anemia in chronic kidney disease: Secondary | ICD-10-CM | POA: Diagnosis not present

## 2023-05-14 DIAGNOSIS — N186 End stage renal disease: Secondary | ICD-10-CM | POA: Diagnosis not present

## 2023-05-14 DIAGNOSIS — N2581 Secondary hyperparathyroidism of renal origin: Secondary | ICD-10-CM | POA: Diagnosis not present

## 2023-05-14 DIAGNOSIS — D509 Iron deficiency anemia, unspecified: Secondary | ICD-10-CM | POA: Diagnosis not present

## 2023-05-14 DIAGNOSIS — N250 Renal osteodystrophy: Secondary | ICD-10-CM | POA: Diagnosis not present

## 2023-05-17 DIAGNOSIS — D631 Anemia in chronic kidney disease: Secondary | ICD-10-CM | POA: Diagnosis not present

## 2023-05-17 DIAGNOSIS — Z992 Dependence on renal dialysis: Secondary | ICD-10-CM | POA: Diagnosis not present

## 2023-05-17 DIAGNOSIS — N2581 Secondary hyperparathyroidism of renal origin: Secondary | ICD-10-CM | POA: Diagnosis not present

## 2023-05-17 DIAGNOSIS — D509 Iron deficiency anemia, unspecified: Secondary | ICD-10-CM | POA: Diagnosis not present

## 2023-05-17 DIAGNOSIS — N186 End stage renal disease: Secondary | ICD-10-CM | POA: Diagnosis not present

## 2023-05-17 DIAGNOSIS — N250 Renal osteodystrophy: Secondary | ICD-10-CM | POA: Diagnosis not present

## 2023-05-19 DIAGNOSIS — N186 End stage renal disease: Secondary | ICD-10-CM | POA: Diagnosis not present

## 2023-05-19 DIAGNOSIS — Z992 Dependence on renal dialysis: Secondary | ICD-10-CM | POA: Diagnosis not present

## 2023-05-19 DIAGNOSIS — D509 Iron deficiency anemia, unspecified: Secondary | ICD-10-CM | POA: Diagnosis not present

## 2023-05-19 DIAGNOSIS — N2581 Secondary hyperparathyroidism of renal origin: Secondary | ICD-10-CM | POA: Diagnosis not present

## 2023-05-19 DIAGNOSIS — N250 Renal osteodystrophy: Secondary | ICD-10-CM | POA: Diagnosis not present

## 2023-05-19 DIAGNOSIS — D631 Anemia in chronic kidney disease: Secondary | ICD-10-CM | POA: Diagnosis not present

## 2023-05-20 ENCOUNTER — Ambulatory Visit: Payer: Medicare Other

## 2023-05-20 NOTE — Telephone Encounter (Unsigned)
 Copied from CRM [id number]. Topic: General - Other >> May 20, 2023  2:48 PM Makayla J wrote: Reason for CRM: The pts home health nurse states Upon arrival she found the pt sitting on the floor in front of her wheelchair, she believes the pt had a fall. She reported no injuries and her vitals are stable, she just wants to report to DR. Simpson. If there are any questions please f/u with Maralyn Sago [phone]

## 2023-05-21 DIAGNOSIS — D509 Iron deficiency anemia, unspecified: Secondary | ICD-10-CM | POA: Diagnosis not present

## 2023-05-21 DIAGNOSIS — N186 End stage renal disease: Secondary | ICD-10-CM | POA: Diagnosis not present

## 2023-05-21 DIAGNOSIS — N250 Renal osteodystrophy: Secondary | ICD-10-CM | POA: Diagnosis not present

## 2023-05-21 DIAGNOSIS — Z992 Dependence on renal dialysis: Secondary | ICD-10-CM | POA: Diagnosis not present

## 2023-05-21 DIAGNOSIS — D631 Anemia in chronic kidney disease: Secondary | ICD-10-CM | POA: Diagnosis not present

## 2023-05-21 DIAGNOSIS — N2581 Secondary hyperparathyroidism of renal origin: Secondary | ICD-10-CM | POA: Diagnosis not present

## 2023-05-24 DIAGNOSIS — D509 Iron deficiency anemia, unspecified: Secondary | ICD-10-CM | POA: Diagnosis not present

## 2023-05-24 DIAGNOSIS — N186 End stage renal disease: Secondary | ICD-10-CM | POA: Diagnosis not present

## 2023-05-24 DIAGNOSIS — N250 Renal osteodystrophy: Secondary | ICD-10-CM | POA: Diagnosis not present

## 2023-05-24 DIAGNOSIS — N2581 Secondary hyperparathyroidism of renal origin: Secondary | ICD-10-CM | POA: Diagnosis not present

## 2023-05-24 DIAGNOSIS — Z992 Dependence on renal dialysis: Secondary | ICD-10-CM | POA: Diagnosis not present

## 2023-05-24 DIAGNOSIS — D631 Anemia in chronic kidney disease: Secondary | ICD-10-CM | POA: Diagnosis not present

## 2023-05-26 DIAGNOSIS — I5032 Chronic diastolic (congestive) heart failure: Secondary | ICD-10-CM | POA: Diagnosis not present

## 2023-05-26 DIAGNOSIS — D509 Iron deficiency anemia, unspecified: Secondary | ICD-10-CM | POA: Diagnosis not present

## 2023-05-26 DIAGNOSIS — Z992 Dependence on renal dialysis: Secondary | ICD-10-CM | POA: Diagnosis not present

## 2023-05-26 DIAGNOSIS — J210 Acute bronchiolitis due to respiratory syncytial virus: Secondary | ICD-10-CM | POA: Diagnosis not present

## 2023-05-26 DIAGNOSIS — J9601 Acute respiratory failure with hypoxia: Secondary | ICD-10-CM | POA: Diagnosis not present

## 2023-05-26 DIAGNOSIS — M62521 Muscle wasting and atrophy, not elsewhere classified, right upper arm: Secondary | ICD-10-CM | POA: Diagnosis not present

## 2023-05-26 DIAGNOSIS — I110 Hypertensive heart disease with heart failure: Secondary | ICD-10-CM | POA: Diagnosis not present

## 2023-05-26 DIAGNOSIS — N186 End stage renal disease: Secondary | ICD-10-CM | POA: Diagnosis not present

## 2023-05-26 DIAGNOSIS — E1122 Type 2 diabetes mellitus with diabetic chronic kidney disease: Secondary | ICD-10-CM | POA: Diagnosis not present

## 2023-05-26 DIAGNOSIS — N250 Renal osteodystrophy: Secondary | ICD-10-CM | POA: Diagnosis not present

## 2023-05-26 DIAGNOSIS — E1151 Type 2 diabetes mellitus with diabetic peripheral angiopathy without gangrene: Secondary | ICD-10-CM | POA: Diagnosis not present

## 2023-05-26 DIAGNOSIS — N2581 Secondary hyperparathyroidism of renal origin: Secondary | ICD-10-CM | POA: Diagnosis not present

## 2023-05-26 DIAGNOSIS — D631 Anemia in chronic kidney disease: Secondary | ICD-10-CM | POA: Diagnosis not present

## 2023-05-26 DIAGNOSIS — M62522 Muscle wasting and atrophy, not elsewhere classified, left upper arm: Secondary | ICD-10-CM | POA: Diagnosis not present

## 2023-05-26 DIAGNOSIS — T8781 Dehiscence of amputation stump: Secondary | ICD-10-CM | POA: Diagnosis not present

## 2023-05-26 DIAGNOSIS — Z89512 Acquired absence of left leg below knee: Secondary | ICD-10-CM | POA: Diagnosis not present

## 2023-05-27 ENCOUNTER — Ambulatory Visit: Payer: Medicare Other | Admitting: *Deleted

## 2023-05-27 ENCOUNTER — Encounter: Payer: Self-pay | Admitting: Family

## 2023-05-27 ENCOUNTER — Encounter (HOSPITAL_COMMUNITY): Payer: Self-pay | Admitting: Hematology

## 2023-05-27 VITALS — BP 168/74 | HR 91 | Temp 98.2°F | Resp 16 | Ht 64.0 in

## 2023-05-27 DIAGNOSIS — D631 Anemia in chronic kidney disease: Secondary | ICD-10-CM | POA: Diagnosis not present

## 2023-05-27 DIAGNOSIS — E785 Hyperlipidemia, unspecified: Secondary | ICD-10-CM | POA: Diagnosis not present

## 2023-05-27 DIAGNOSIS — I639 Cerebral infarction, unspecified: Secondary | ICD-10-CM

## 2023-05-27 DIAGNOSIS — N250 Renal osteodystrophy: Secondary | ICD-10-CM | POA: Diagnosis not present

## 2023-05-27 DIAGNOSIS — D509 Iron deficiency anemia, unspecified: Secondary | ICD-10-CM | POA: Diagnosis not present

## 2023-05-27 DIAGNOSIS — Z992 Dependence on renal dialysis: Secondary | ICD-10-CM | POA: Diagnosis not present

## 2023-05-27 DIAGNOSIS — N186 End stage renal disease: Secondary | ICD-10-CM | POA: Diagnosis not present

## 2023-05-27 DIAGNOSIS — N2581 Secondary hyperparathyroidism of renal origin: Secondary | ICD-10-CM | POA: Diagnosis not present

## 2023-05-27 MED ORDER — INCLISIRAN SODIUM 284 MG/1.5ML ~~LOC~~ SOSY
284.0000 mg | PREFILLED_SYRINGE | Freq: Once | SUBCUTANEOUS | Status: AC
  Administered 2023-05-27: 284 mg via SUBCUTANEOUS
  Filled 2023-05-27: qty 1.5

## 2023-05-27 NOTE — Patient Instructions (Signed)
 Inclisiran Injection What is this medication? INCLISIRAN (in kli SIR an) treats high cholesterol. It works by decreasing bad cholesterol (such as LDL) in your blood. Changes to diet and exercise are often combined with this medication. This medicine may be used for other purposes; ask your health care provider or pharmacist if you have questions. COMMON BRAND NAME(S): LEQVIO What should I tell my care team before I take this medication? They need to know if you have any of these conditions: An unusual or allergic reaction to inclisiran, other medications, foods, dyes, or preservatives Pregnant or trying to get pregnant Breast-feeding How should I use this medication? This medication is injected under the skin. It is given by your care team in a hospital or clinic setting. Talk to your care team about the use of this medication in children. Special care may be needed. Overdosage: If you think you have taken too much of this medicine contact a poison control center or emergency room at once. NOTE: This medicine is only for you. Do not share this medicine with others. What if I miss a dose? Keep appointments for follow-up doses. It is important not to miss your dose. Call your care team if you are unable to keep an appointment. What may interact with this medication? Interactions are not expected. This list may not describe all possible interactions. Give your health care provider a list of all the medicines, herbs, non-prescription drugs, or dietary supplements you use. Also tell them if you smoke, drink alcohol, or use illegal drugs. Some items may interact with your medicine. What should I watch for while using this medication? Visit your care team for regular checks on your progress. Tell your care team if your symptoms do not start to get better or if they get worse. You may need blood work while you are taking this medication. What side effects may I notice from receiving this  medication? Side effects that you should report to your care team as soon as possible: Allergic reactions--skin rash, itching, hives, swelling of the face, lips, tongue, or throat Side effects that usually do not require medical attention (report these to your care team if they continue or are bothersome): Joint pain Pain, redness, or irritation at injection site This list may not describe all possible side effects. Call your doctor for medical advice about side effects. You may report side effects to FDA at 1-800-FDA-1088. Where should I keep my medication? This medication is given in a hospital or clinic. It will not be stored at home. NOTE: This sheet is a summary. It may not cover all possible information. If you have questions about this medicine, talk to your doctor, pharmacist, or health care provider.  2024 Elsevier/Gold Standard (2023-02-19 00:00:00)

## 2023-05-27 NOTE — Progress Notes (Signed)
 Diagnosis: Hyperlipidemia  Provider:  Chilton Greathouse MD  Procedure: Injection  Leqvio (inclisiran), Dose: 284 mg, Site: subcutaneous, Number of injections: 1  Injection Site(s): Right upper quad. abdomen  Post Care: Observation period completed  Discharge: Condition: Good, Destination: Home . AVS Provided  Performed by:  Forrest Moron, RN

## 2023-05-28 DIAGNOSIS — D631 Anemia in chronic kidney disease: Secondary | ICD-10-CM | POA: Diagnosis not present

## 2023-05-28 DIAGNOSIS — D509 Iron deficiency anemia, unspecified: Secondary | ICD-10-CM | POA: Diagnosis not present

## 2023-05-28 DIAGNOSIS — N250 Renal osteodystrophy: Secondary | ICD-10-CM | POA: Diagnosis not present

## 2023-05-28 DIAGNOSIS — N186 End stage renal disease: Secondary | ICD-10-CM | POA: Diagnosis not present

## 2023-05-28 DIAGNOSIS — N2581 Secondary hyperparathyroidism of renal origin: Secondary | ICD-10-CM | POA: Diagnosis not present

## 2023-05-28 DIAGNOSIS — Z992 Dependence on renal dialysis: Secondary | ICD-10-CM | POA: Diagnosis not present

## 2023-05-31 DIAGNOSIS — N186 End stage renal disease: Secondary | ICD-10-CM | POA: Diagnosis not present

## 2023-05-31 DIAGNOSIS — N2581 Secondary hyperparathyroidism of renal origin: Secondary | ICD-10-CM | POA: Diagnosis not present

## 2023-05-31 DIAGNOSIS — N250 Renal osteodystrophy: Secondary | ICD-10-CM | POA: Diagnosis not present

## 2023-05-31 DIAGNOSIS — Z992 Dependence on renal dialysis: Secondary | ICD-10-CM | POA: Diagnosis not present

## 2023-05-31 DIAGNOSIS — D509 Iron deficiency anemia, unspecified: Secondary | ICD-10-CM | POA: Diagnosis not present

## 2023-05-31 DIAGNOSIS — D631 Anemia in chronic kidney disease: Secondary | ICD-10-CM | POA: Diagnosis not present

## 2023-06-02 DIAGNOSIS — N186 End stage renal disease: Secondary | ICD-10-CM | POA: Diagnosis not present

## 2023-06-02 DIAGNOSIS — D509 Iron deficiency anemia, unspecified: Secondary | ICD-10-CM | POA: Diagnosis not present

## 2023-06-02 DIAGNOSIS — N250 Renal osteodystrophy: Secondary | ICD-10-CM | POA: Diagnosis not present

## 2023-06-02 DIAGNOSIS — N2581 Secondary hyperparathyroidism of renal origin: Secondary | ICD-10-CM | POA: Diagnosis not present

## 2023-06-02 DIAGNOSIS — Z992 Dependence on renal dialysis: Secondary | ICD-10-CM | POA: Diagnosis not present

## 2023-06-02 DIAGNOSIS — D631 Anemia in chronic kidney disease: Secondary | ICD-10-CM | POA: Diagnosis not present

## 2023-06-03 ENCOUNTER — Ambulatory Visit: Payer: Medicare PPO | Admitting: Neurology

## 2023-06-04 DIAGNOSIS — D509 Iron deficiency anemia, unspecified: Secondary | ICD-10-CM | POA: Diagnosis not present

## 2023-06-04 DIAGNOSIS — D631 Anemia in chronic kidney disease: Secondary | ICD-10-CM | POA: Diagnosis not present

## 2023-06-04 DIAGNOSIS — Z992 Dependence on renal dialysis: Secondary | ICD-10-CM | POA: Diagnosis not present

## 2023-06-04 DIAGNOSIS — N2581 Secondary hyperparathyroidism of renal origin: Secondary | ICD-10-CM | POA: Diagnosis not present

## 2023-06-04 DIAGNOSIS — N250 Renal osteodystrophy: Secondary | ICD-10-CM | POA: Diagnosis not present

## 2023-06-04 DIAGNOSIS — N186 End stage renal disease: Secondary | ICD-10-CM | POA: Diagnosis not present

## 2023-06-04 NOTE — Telephone Encounter (Signed)
 Called and spoke with patient's daughter (DPR), her mother is a dialysis patient on MWF.  Scheduled her to see Beth on 4/24 at 1:30 pm, advised to arrive by 1:15 pm for check in.  Her daughter states it has been a while since she has been on a CPAP machine and she needs to get a new one.  She is scheduled for a CPAP titration study on 06/10/2023.  Nothing further needed.

## 2023-06-04 NOTE — Telephone Encounter (Signed)
 Kathryn Beck, Kathryn Beck, Kathryn Beck; Kathryn Beck, Kathryn Beck; Kathryn Beck, Kathryn Beck; Kathryn Beck, Kathryn Beck Hello In order to process the replacement cpap order will need notes stating patient is using and benefitting from current cpap, or results from titration that Dr. has ordered.  Will not be Kathryn Beck to move forward with order without this information. Thank you!

## 2023-06-07 ENCOUNTER — Ambulatory Visit: Payer: Self-pay | Admitting: *Deleted

## 2023-06-07 ENCOUNTER — Telehealth: Payer: Self-pay | Admitting: Family Medicine

## 2023-06-07 DIAGNOSIS — D509 Iron deficiency anemia, unspecified: Secondary | ICD-10-CM | POA: Diagnosis not present

## 2023-06-07 DIAGNOSIS — N250 Renal osteodystrophy: Secondary | ICD-10-CM | POA: Diagnosis not present

## 2023-06-07 DIAGNOSIS — Z992 Dependence on renal dialysis: Secondary | ICD-10-CM | POA: Diagnosis not present

## 2023-06-07 DIAGNOSIS — D631 Anemia in chronic kidney disease: Secondary | ICD-10-CM | POA: Diagnosis not present

## 2023-06-07 DIAGNOSIS — N2581 Secondary hyperparathyroidism of renal origin: Secondary | ICD-10-CM | POA: Diagnosis not present

## 2023-06-07 DIAGNOSIS — N186 End stage renal disease: Secondary | ICD-10-CM | POA: Diagnosis not present

## 2023-06-07 MED ORDER — BUSPIRONE HCL 5 MG PO TABS: 5.0000 mg | ORAL_TABLET | Freq: Two times a day (BID) | ORAL | 3 refills | Status: DC

## 2023-06-07 NOTE — Patient Instructions (Signed)
 Visit Information  Thank you for taking time to visit with me today. Please don't hesitate to contact me if I can be of assistance to you.   Following are the goals we discussed today:   Goals Addressed               This Visit's Progress     COMPLETED: Receive Counseling & Supportive Services to Help Cope with Chronic Illnesses. (pt-stated)   On track     Care Coordination Interventions:  Interventions Today    Flowsheet Row Most Recent Value  Chronic Disease   Chronic disease during today's visit Diabetes, Congestive Heart Failure (CHF), Hypertension (HTN), Chronic Kidney Disease/End Stage Renal Disease (ESRD), Other  [Shortness of Breath, Receives Dialysis, Nasal Congestion, Pneumonia, Bilatereal Below Knee Amputation, Restricted Left Arm, Neuropathy, Chronic Pain Syndrome, Anxiety, Depression, Memory Loss, Neurocognitive Disorder.]  General Interventions   General Interventions Discussed/Reviewed General Interventions Discussed, General Interventions Reviewed, Doctor Visits, Communication with  [Encouraged Routine Engagement with Care Team Members & Providers.]  Doctor Visits Discussed/Reviewed Doctor Visits Discussed, Specialist, Doctor Visits Reviewed, Annual Wellness Visits, PCP  [Encouraged Routine Engagement with Care Team Members & Providers.]  PCP/Specialist Visits Compliance with follow-up visit  [Encouraged Routine Engagement with Care Team Members & Providers.]  Communication with PCP/Specialists, RN, Pharmacists, Social Work  [employer] Routine Engagement with Care Team Members & Providers.]  Level of Care Adult Daycare, Assisted Living, Skilled Nursing Facility  [Confirmed Disinterest in Enrollment in Adult Day Care Program or Receiving Assistance Pursuing Higher Level of Care Placement Options (I.e Assisted Living Versus Skilled Nursing Facility).]  Applications Medicaid, Personal Care Services  [Confirmed Disinterest in Receiving Assistance Applying for Medicaid,  Personal Care Services or Aid & Attendance Benefits.]  Education Interventions   Education Provided Provided Education  [Thoroughly Reviewed Educational Material to Ensure Understanding & Entertain Questions.]  Provided Verbal Education On When to see the doctor, Community Resources, Mental Health/Coping with Illness  [Encouraged Continued Independent Review of Educational Material Provided.]  Applications Medicaid, Personal Care Services  [Confirmed Disinterest in Receiving Assistance Applying for Medicaid, Personal Care Services or Aid & Attendance Benefits.]  Mental Health Interventions   Mental Health Discussed/Reviewed Mental Health Discussed, Anxiety, Depression, Grief and Loss, Mental Health Reviewed, Substance Abuse, Coping Strategies, Suicide, Crisis, Other  [Assessed Mental Health & Cognitive Status.]        Active Listening & Reflection Utilized.  Verbalization of Feelings Encouraged. Emotional Support Provided. Client-Centered Performed. Acceptance & Commitment Therapy Initiated. CSW Collaboration with Dr. Syliva Overman, Primary Care Provider with [facility] [phone]), Via Secure Chat Message & Routed Note in Epic, to Request Prescription Medication to Reduce & Manage Symptoms of Anxiety, Per Daughter, Kathryn Beck Request. Encouraged Engagement with Danford Bad, Licensed Clinical Social Worker with [facility], [facility] [phone]), if You Have Questions, Need Assistance, Additional Social Work Needs Are Identified in The Near Future, or If You Change Your Mind About Wanting to Receive Social Work Services.      Please call the care guide team at [phone] if you need to cancel or reschedule your appointment.   If you are experiencing a Mental Health or Behavioral Health Crisis or need someone to talk to, please call the Suicide and Crisis Lifeline: 988 call the [place] National Suicide Prevention  Lifeline: 903-189-1500 or TTY: 213-747-2730 TTY (830)379-5007) to talk to a trained counselor call 1-800-273-TALK (toll free, 24 hour hotline) go to [facility]  [address] [place] [phone]) call the [facility] Line: [phone] call 911  Patient verbalizes understanding of instructions and care plan provided today and agrees to view in MyChart. Active MyChart status and patient understanding of how to access instructions and care plan via MyChart confirmed with patient.     No further follow up required.   Danford Bad, BSW, MSW, LCSW [facility], [facility] Clinical Social Worker II Direct Dial: [phone]  Fax: [phone] Website: [email or web address]

## 2023-06-07 NOTE — Patient Outreach (Signed)
 Care Coordination   Follow Up Visit Note   06/07/2023  Name: Kathryn Beck MRN: [id number] DOB: [birth year]  Kathryn Beck is a 68 y.o. year old female who sees [name], MD for primary care. I spoke with Kathryn Beck and daughter, Kathryn Beck by phone today.  What matters to the patients health and wellness today?  Receive Counseling & Supportive Services to Help Cope with Chronic Illnesses.    Goals Addressed               This Visit's Progress     COMPLETED: Receive Counseling & Supportive Services to Help Cope with Chronic Illnesses. (pt-stated)   On track     Care Coordination Interventions:  Interventions Today    Flowsheet Row Most Recent Value  Chronic Disease   Chronic disease during today's visit Diabetes, Congestive Heart Failure (CHF), Hypertension (HTN), Chronic Kidney Disease/End Stage Renal Disease (ESRD), Other  [Shortness of Breath, Receives Dialysis, Nasal Congestion, Pneumonia, Bilatereal Below Knee Amputation, Restricted Left Arm, Neuropathy, Chronic Pain Syndrome, Anxiety, Depression, Memory Loss, Neurocognitive Disorder.]  General Interventions   General Interventions Discussed/Reviewed General Interventions Discussed, General Interventions Reviewed, Doctor Visits, Communication with  [Encouraged Routine Engagement with Care Team Members & Providers.]  Doctor Visits Discussed/Reviewed Doctor Visits Discussed, Specialist, Doctor Visits Reviewed, Annual Wellness Visits, PCP  [Encouraged Routine Engagement with Care Team Members & Providers.]  PCP/Specialist Visits Compliance with follow-up visit  [Encouraged Routine Engagement with Care Team Members & Providers.]  Communication with PCP/Specialists, RN, Pharmacists, Social Work  [employer] Routine Engagement with Care Team Members & Providers.]  Level of Care Adult Daycare, Assisted Living, Skilled Nursing Facility  [Confirmed Disinterest in Enrollment in Adult Day Care Program or Receiving  Assistance Pursuing Higher Level of Care Placement Options (I.e Assisted Living Versus Skilled Nursing Facility).]  Applications Medicaid, Personal Care Services  [Confirmed Disinterest in Receiving Assistance Applying for Medicaid, Personal Care Services or Aid & Attendance Benefits.]  Education Interventions   Education Provided Provided Education  [Thoroughly Reviewed Educational Material to Ensure Understanding & Entertain Questions.]  Provided Verbal Education On When to see the doctor, Community Resources, Mental Health/Coping with Illness  [Encouraged Continued Independent Review of Educational Material Provided.]  Applications Medicaid, Personal Care Services  [Confirmed Disinterest in Receiving Assistance Applying for Medicaid, Personal Care Services or Aid & Attendance Benefits.]  Mental Health Interventions   Mental Health Discussed/Reviewed Mental Health Discussed, Anxiety, Depression, Grief and Loss, Mental Health Reviewed, Substance Abuse, Coping Strategies, Suicide, Crisis, Other  [Assessed Mental Health & Cognitive Status.]        Active Listening & Reflection Utilized.  Verbalization of Feelings Encouraged. Emotional Support Provided. Client-Centered Performed. Acceptance & Commitment Therapy Initiated. CSW Collaboration with Dr. Syliva Overman, Primary Care Provider with [facility] [phone]), Via Secure Chat Message & Routed Note in Epic, to Request Prescription Medication to Reduce & Manage Symptoms of Anxiety, Per Daughter, Kathryn Beck Request. Encouraged Engagement with Danford Bad, Licensed Clinical Social Worker with [facility], [facility] [phone]), if You Have Questions, Need Assistance, Additional Social Work Needs Are Identified in The Near Future, or If You Change Your Mind About Wanting to Receive Social Work Services.      SDOH assessments and interventions completed:  Yes.  Care  Coordination Interventions:  Yes, provided.   Follow up plan: No further intervention required.   Encounter Outcome:  Patient Visit Completed.   Danford Bad, BSW, MSW, [employer]  [facility]  [facility], [facility] Clinical Social Worker II Direct Dial: [phone]  Fax: [phone] Website: [email or web address]

## 2023-06-07 NOTE — Telephone Encounter (Signed)
 Msg from [facility] Daughters daughter requesting anxiety meds, low dose buspar prescribed twice daily, 5 mg , hjas appt in 2 months

## 2023-06-09 ENCOUNTER — Telehealth: Payer: Self-pay

## 2023-06-09 NOTE — Telephone Encounter (Signed)
 Patient was identified as falling into the [place] Measure - Diabetes.   Patient was: Appointment scheduled for lab or office visit for A1c.

## 2023-06-10 ENCOUNTER — Ambulatory Visit (HOSPITAL_BASED_OUTPATIENT_CLINIC_OR_DEPARTMENT_OTHER): Payer: Medicare Other | Admitting: Pulmonary Disease

## 2023-06-11 DIAGNOSIS — N2581 Secondary hyperparathyroidism of renal origin: Secondary | ICD-10-CM | POA: Diagnosis not present

## 2023-06-11 DIAGNOSIS — D509 Iron deficiency anemia, unspecified: Secondary | ICD-10-CM | POA: Diagnosis not present

## 2023-06-11 DIAGNOSIS — Z992 Dependence on renal dialysis: Secondary | ICD-10-CM | POA: Diagnosis not present

## 2023-06-11 DIAGNOSIS — D631 Anemia in chronic kidney disease: Secondary | ICD-10-CM | POA: Diagnosis not present

## 2023-06-11 DIAGNOSIS — N186 End stage renal disease: Secondary | ICD-10-CM | POA: Diagnosis not present

## 2023-06-11 DIAGNOSIS — N250 Renal osteodystrophy: Secondary | ICD-10-CM | POA: Diagnosis not present

## 2023-06-14 DIAGNOSIS — N250 Renal osteodystrophy: Secondary | ICD-10-CM | POA: Diagnosis not present

## 2023-06-14 DIAGNOSIS — N2581 Secondary hyperparathyroidism of renal origin: Secondary | ICD-10-CM | POA: Diagnosis not present

## 2023-06-14 DIAGNOSIS — N186 End stage renal disease: Secondary | ICD-10-CM | POA: Diagnosis not present

## 2023-06-14 DIAGNOSIS — D631 Anemia in chronic kidney disease: Secondary | ICD-10-CM | POA: Diagnosis not present

## 2023-06-14 DIAGNOSIS — D509 Iron deficiency anemia, unspecified: Secondary | ICD-10-CM | POA: Diagnosis not present

## 2023-06-14 DIAGNOSIS — Z992 Dependence on renal dialysis: Secondary | ICD-10-CM | POA: Diagnosis not present

## 2023-06-15 ENCOUNTER — Telehealth: Payer: Self-pay | Admitting: Family Medicine

## 2023-06-15 ENCOUNTER — Ambulatory Visit (HOSPITAL_COMMUNITY)
Admission: RE | Admit: 2023-06-15 | Discharge: 2023-06-15 | Disposition: A | Discharge status: Home / Self Care | Source: Ambulatory Visit | Attending: Family | Admitting: Family

## 2023-06-15 DIAGNOSIS — R011 Cardiac murmur, unspecified: Secondary | ICD-10-CM | POA: Diagnosis not present

## 2023-06-15 LAB — ECHOCARDIOGRAM COMPLETE
AR max vel: 1.05 cm2
AV Area VTI: 1.14 cm2
AV Area mean vel: 1.06 cm2
AV Mean grad: 21 mmHg
AV Peak grad: 37 mmHg
Ao pk vel: 3.04 m/s
Area-P 1/2: 4.04 cm2
Calc EF: 56.3 %
Est EF: 55
MV VTI: 1.79 cm2
S' Lateral: 4.3 cm
Single Plane A2C EF: 57.5 %
Single Plane A4C EF: 55.3 %

## 2023-06-15 NOTE — Telephone Encounter (Signed)
 Placard  Noted Copied  Sleeved (put in provider box)  Call patient daughter when ready at [phone]

## 2023-06-15 NOTE — Telephone Encounter (Unsigned)
 Copied from CRM [id number]. Topic: General - Other >> Jun 15, 2023 11:44 AM Almira Coaster wrote: Reason for CRM: Patient's daughter is calling regarding handicap placard froms she dropped off to the office, she wanted to specify she would need two. One for her car and one for patient's husband car when he drives her around. She doesn't know if two forms need to be submitted or if vehicle information is required.    Call patient daughter when ready at [phone]

## 2023-06-16 ENCOUNTER — Encounter (HOSPITAL_BASED_OUTPATIENT_CLINIC_OR_DEPARTMENT_OTHER): Payer: Self-pay

## 2023-06-16 DIAGNOSIS — Z992 Dependence on renal dialysis: Secondary | ICD-10-CM | POA: Diagnosis not present

## 2023-06-16 DIAGNOSIS — N186 End stage renal disease: Secondary | ICD-10-CM | POA: Diagnosis not present

## 2023-06-16 DIAGNOSIS — N250 Renal osteodystrophy: Secondary | ICD-10-CM | POA: Diagnosis not present

## 2023-06-16 DIAGNOSIS — D509 Iron deficiency anemia, unspecified: Secondary | ICD-10-CM | POA: Diagnosis not present

## 2023-06-16 DIAGNOSIS — D631 Anemia in chronic kidney disease: Secondary | ICD-10-CM | POA: Diagnosis not present

## 2023-06-16 DIAGNOSIS — N2581 Secondary hyperparathyroidism of renal origin: Secondary | ICD-10-CM | POA: Diagnosis not present

## 2023-06-16 NOTE — Telephone Encounter (Signed)
 We can check the box on the form for 2 placards. We will let her know when ready to pickup. Thanks

## 2023-06-16 NOTE — Telephone Encounter (Signed)
Called patient daughter.

## 2023-06-17 ENCOUNTER — Other Ambulatory Visit (HOSPITAL_BASED_OUTPATIENT_CLINIC_OR_DEPARTMENT_OTHER): Payer: Self-pay

## 2023-06-17 NOTE — Telephone Encounter (Signed)
Called patient forms ready for pick up.

## 2023-06-18 DIAGNOSIS — D509 Iron deficiency anemia, unspecified: Secondary | ICD-10-CM | POA: Diagnosis not present

## 2023-06-18 DIAGNOSIS — N2581 Secondary hyperparathyroidism of renal origin: Secondary | ICD-10-CM | POA: Diagnosis not present

## 2023-06-18 DIAGNOSIS — N186 End stage renal disease: Secondary | ICD-10-CM | POA: Diagnosis not present

## 2023-06-18 DIAGNOSIS — D631 Anemia in chronic kidney disease: Secondary | ICD-10-CM | POA: Diagnosis not present

## 2023-06-18 DIAGNOSIS — N250 Renal osteodystrophy: Secondary | ICD-10-CM | POA: Diagnosis not present

## 2023-06-18 DIAGNOSIS — Z992 Dependence on renal dialysis: Secondary | ICD-10-CM | POA: Diagnosis not present

## 2023-06-21 ENCOUNTER — Encounter (HOSPITAL_BASED_OUTPATIENT_CLINIC_OR_DEPARTMENT_OTHER): Payer: Self-pay

## 2023-06-21 DIAGNOSIS — D631 Anemia in chronic kidney disease: Secondary | ICD-10-CM | POA: Diagnosis not present

## 2023-06-21 DIAGNOSIS — N250 Renal osteodystrophy: Secondary | ICD-10-CM | POA: Diagnosis not present

## 2023-06-21 DIAGNOSIS — D509 Iron deficiency anemia, unspecified: Secondary | ICD-10-CM | POA: Diagnosis not present

## 2023-06-21 DIAGNOSIS — N2581 Secondary hyperparathyroidism of renal origin: Secondary | ICD-10-CM | POA: Diagnosis not present

## 2023-06-21 DIAGNOSIS — Z992 Dependence on renal dialysis: Secondary | ICD-10-CM | POA: Diagnosis not present

## 2023-06-21 DIAGNOSIS — N186 End stage renal disease: Secondary | ICD-10-CM | POA: Diagnosis not present

## 2023-06-22 ENCOUNTER — Encounter (HOSPITAL_BASED_OUTPATIENT_CLINIC_OR_DEPARTMENT_OTHER): Payer: Self-pay | Admitting: Cardiovascular Disease

## 2023-06-22 ENCOUNTER — Encounter: Payer: Self-pay | Admitting: Family

## 2023-06-22 ENCOUNTER — Ambulatory Visit (HOSPITAL_BASED_OUTPATIENT_CLINIC_OR_DEPARTMENT_OTHER): Payer: Medicare Other | Admitting: Cardiovascular Disease

## 2023-06-22 ENCOUNTER — Encounter (HOSPITAL_COMMUNITY): Payer: Self-pay | Admitting: Hematology

## 2023-06-22 VITALS — BP 139/65 | HR 91 | Ht 64.0 in

## 2023-06-22 DIAGNOSIS — Z89511 Acquired absence of right leg below knee: Secondary | ICD-10-CM | POA: Diagnosis not present

## 2023-06-22 DIAGNOSIS — I10 Essential (primary) hypertension: Secondary | ICD-10-CM

## 2023-06-22 DIAGNOSIS — E782 Mixed hyperlipidemia: Secondary | ICD-10-CM

## 2023-06-22 DIAGNOSIS — I739 Peripheral vascular disease, unspecified: Secondary | ICD-10-CM

## 2023-06-22 DIAGNOSIS — Z992 Dependence on renal dialysis: Secondary | ICD-10-CM | POA: Diagnosis not present

## 2023-06-22 DIAGNOSIS — G4733 Obstructive sleep apnea (adult) (pediatric): Secondary | ICD-10-CM | POA: Diagnosis not present

## 2023-06-22 DIAGNOSIS — E66811 Obesity, class 1: Secondary | ICD-10-CM

## 2023-06-22 DIAGNOSIS — N2581 Secondary hyperparathyroidism of renal origin: Secondary | ICD-10-CM | POA: Diagnosis not present

## 2023-06-22 DIAGNOSIS — I259 Chronic ischemic heart disease, unspecified: Secondary | ICD-10-CM | POA: Diagnosis not present

## 2023-06-22 DIAGNOSIS — Z6834 Body mass index (BMI) 34.0-34.9, adult: Secondary | ICD-10-CM

## 2023-06-22 DIAGNOSIS — I639 Cerebral infarction, unspecified: Secondary | ICD-10-CM

## 2023-06-22 DIAGNOSIS — N186 End stage renal disease: Secondary | ICD-10-CM | POA: Diagnosis not present

## 2023-06-22 DIAGNOSIS — E6609 Other obesity due to excess calories: Secondary | ICD-10-CM

## 2023-06-22 NOTE — Patient Instructions (Signed)
 Medication Instructions:  Your physician recommends that you continue on your current medications as directed. Please refer to the Current Medication list given to you today.   Labwork: NONE  Testing/Procedures: NONE  Follow-Up: 2 MONTHS WITH DR [facility]   Any Other Special Instructions Will Be Listed Below (If Applicable). MONITOR YOUR BLOOD PRESSURE TWICE A DAY. SEND MYCHART MESSAGE IN 2 WEEKS WITH UPDATED READINGS    If you need a refill on your cardiac medications before your next appointment, please call your pharmacy.

## 2023-06-22 NOTE — Progress Notes (Signed)
 [name]:    Date:  06/22/2023   ID:  Kathryn Beck, DOB 1955/11/09, MRN [id number]  PCP:  [name], MD  Cardiologist:  Nona Dell, MD   Referring MD: [name], MD   CC: Hypertension  History of Present Illness:    Kathryn Beck is a 68 y.o. female with a hx of ESRD on HD, diabetes, aortic stenosis, PAD status post R BKA, discoid lupus, HFpEF, OSA, prior CVA, hyperlipidemia, and statin intolerance here for follow-up.  She was referred by Dr. Lodema Hong for uncontrolled hypertension with episodes of hypotension during dialysis.  She was first seen in advanced hypertension clinic 03/2022.  She was first diagnosed with hypertension in her 30s.  At her initial appointment she was taking clonidine daily.  This was increased to twice a day.  She was also referred to the PREP exercise program.  She followed up with her PCP 04/2022 and blood pressure was 105/67 after an admission earlier that month.  Amlodipine and clonidine were both discontinued.  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  She then had a left BKA 09/2022.  She had an admission 10/2022 with acute hypoxic respiratory failure.  11/2022 she was admitted for a fall with syncope.  Carotid Dopplers 11/2022 which revealed mild nonobstructive disease.  Echo at the time revealed LVEF 50-55% with grade 2 diastolic dysfunction.  She had mild aortic regurgitation and mild aortic stenosis.  Mean gradient was 10.7 mmHg.  However on repeat echo 04/2023 the gradient was 21 mmHg.  She followed up with Kathryn Shields, NP 03/2023 and noted that her blood pressure was generally good.  She takes her antihypertensives after dialysis.  At that visit office blood pressure was 168/73.  Amlodipine was increased back to 10 mg and she remains on metoprolol.  She has been evaluated by endocrinology and ruled out for hyperaldosteronism, and pheochromocytoma as  prior imaging has noted an adrenal adenoma.  Pituitary MRI noted a pituitary mass and cortisol levels have been elevated.  Unclear whether this is a hormone producing adenoma or Cushing's.  Investigation has been difficult due to her dialysis and she has been referred to [facility].  Discussed the use of AI scribe software for clinical note transcription with the patient, who gave verbal consent to proceed.  History of Present Illness Kathryn Beck is a dialysis patient, undergoing treatment on Mondays, Wednesdays, and Fridays. She experiences fluctuations in blood pressure, with significant drops during dialysis sessions, sometimes leading to early termination. Her current medications for blood pressure management include amlodipine, hydralazine, and metoprolol, which she avoids on dialysis days to prevent hypotension. She monitors her blood pressure at home, noting occasional elevations, and adjusts her medication accordingly. No chest pain or pressure is noted.  She has a history of hypertension, resulting in left ventricular hypertrophy. She is on aspirin and Plavix for arterial disease and takes Zetia for cholesterol management. She denies chest pain or pressure.  She has moderate aortic valve stenosis with a pressure gradient of 21 mmHg. She does not experience symptoms such as shortness of breath or swelling. Regular monitoring is in place due to potential progression to severe stenosis.  She experiences significant anxiety, describing her nerves as 'bad' and worsening over time. She is currently on sertraline 50 mg for anxiety but reports feeling nervous and shaky. She does not have a therapist at present and is not engaged in therapy due to her  current health focus. No chest pain or pressure is reported.  She reports experiencing ear pain and drainage from one ear, ongoing since she was 68 years old. She previously had tubes in her ears and is scheduled to see an ENT for further evaluation. She has  also experienced fever for a couple of nights and diarrhea.   Past Medical History:  Diagnosis Date   Acid reflux    Amputated left leg (HCC)    Anemia    Arthritis    Axillary masses    Soft tissue - status post excision   Back pain    Cancer (HCC)    CHF (congestive heart failure) (HCC)    COVID-19 virus infection 04/06/2019   Depression    End-stage renal disease (HCC)    M/W/F dialysis   Essential hypertension    Headache    years ago   History of blood transfusion    History of cardiac catheterization    Normal coronary arteries October 2020   History of claustrophobia    History of pneumonia 2019   Hypoxia 04/03/2019   Memory loss    Mixed hyperlipidemia    Obesity    Pancreatitis    Peritoneal dialysis catheter in place [facility])    Pneumonia due to COVID-19 virus 04/02/2019   Sleep apnea    Noncompliant with CPAP   Stroke (HCC)    mini stroke   Type 2 diabetes mellitus (HCC)     Past Surgical History:  Procedure Laterality Date   ABDOMINAL AORTOGRAM W/LOWER EXTREMITY N/A 04/30/2022   Procedure: ABDOMINAL AORTOGRAM W/LOWER EXTREMITY;  Surgeon: Leonie Douglas, MD;  Location: [facility] INV[facility] LAB;  Service: Cardiovascular;  Laterality: N/A;   ABDOMINAL AORTOGRAM W/LOWER EXTREMITY N/A 07/21/2022   Procedure: ABDOMINAL AORTOGRAM W/LOWER EXTREMITY;  Surgeon: Nada Libman, MD;  Location: [facility];  Service: Cardiovascular;  Laterality: N/A;   ABDOMINAL HYSTERECTOMY     ACHILLES TENDON LENGTHENING  08/15/2022   Procedure: ACHILLES TENDON LENGTHENING;  Surgeon: [name], DPM;  Location: [facility] OR;  Service: Podiatry;;   AMPUTATION Right 05/29/2022   Procedure: RIGHT BELOW THE KNEE AMPUTATION;  Surgeon: [name], MD;  Location: [facility] OR;  Service: Orthopedics;  Laterality: Right;   AMPUTATION Left 09/04/2022   Procedure: AMPUTATION FOOT, serial irrigation;  Surgeon: Louann Sjogren, DPM;  Location: [facility] OR;  Service: Podiatry;  Laterality: Left;  Surgical  team to do block   AMPUTATION Left 10/07/2022   Procedure: LEFT BELOW KNEE AMPUTATION;  Surgeon: [name], MD;  Location: [facility] OR;  Service: Orthopedics;  Laterality: Left;   AV FISTULA PLACEMENT Left 09/02/2017   Procedure: creation of left arm ARTERIOVENOUS (AV) FISTULA;  Surgeon: Nada Libman, MD;  Location: [facility] OR;  Service: Vascular;  Laterality: Left;   COLONOSCOPY  2008   Dr. Darrick Penna: normal    COLONOSCOPY N/A 12/18/2016   Dr. Darrick Penna: multiple tubular adenomas, internal hemorrhoids. Surveillance in 3 years    ESOPHAGEAL DILATION N/A 10/13/2015   Procedure: ESOPHAGEAL DILATION;  Surgeon: Malissa Hippo, MD;  Location: [facility];  Service: Endoscopy;  Laterality: N/A;   ESOPHAGOGASTRODUODENOSCOPY N/A 10/13/2015   Dr. Karilyn Cota: chronic gastritis on path, no H.pylori. Empiric dilation    ESOPHAGOGASTRODUODENOSCOPY N/A 12/18/2016   Dr. Darrick Penna: mild gastritis. BRAVO study revealed uncontrolled GERD. Dysphagia secondary to uncontrolled reflux   FOOT SURGERY Bilateral    "nerve"     LEFT HEART CATH AND CORONARY ANGIOGRAPHY N/A 12/29/2018  Procedure: LEFT HEART CATH AND CORONARY ANGIOGRAPHY;  Surgeon: Corky Crafts, MD;  Location[facility]VAS[facility] CV LAB;  Service: Cardiovascular;  Laterality: N/A;   LOWER EXTREMITY ANGIOGRAPHY Right 05/04/2022   Procedure: Lower Extremity Angiography;  Surgeon: Victorino Sparrow, MD;  Location: [facility] INVASIVE CV LAB;  Service: Cardiovascular;  Laterality: Right;   LUNG BIOPSY     MASS EXCISION Right 01/09/2013   Procedure: EXCISION OF NEOPLASM OF RIGHT  AXILLA  AND EXCISION OF NEOPLASM OF LEFT AXILLA;  Surgeon: Dalia Heading, MD;  Location: [facility] ORS;  Service: General;  Laterality: Right;  procedure end @ 08:23   MYRINGOTOMY WITH TUBE PLACEMENT Bilateral 04/28/2017   Procedure: BILATERAL MYRINGOTOMY WITH TUBE PLACEMENT;  Surgeon: Newman Pies, MD;  Location: [facility] OR;  Service: ENT;  Laterality: Bilateral;   PERIPHERAL VASCULAR BALLOON ANGIOPLASTY Right 05/04/2022    Procedure: PERIPHERAL VASCULAR BALLOON ANGIOPLASTY;  Surgeon: Victorino Sparrow, MD;  Location: [facility] INVASIVE CV LAB;  Service: Cardiovascular;  Laterality: Right;  PT   PERIPHERAL VASCULAR INTERVENTION Right 05/04/2022   Procedure: PERIPHERAL VASCULAR INTERVENTION;  Surgeon: Victorino Sparrow, MD;  Location: [facility] INVASIVE CV LAB;  Service: Cardiovascular;  Laterality: Right;  SFA   PERIPHERAL VASCULAR INTERVENTION Left 07/21/2022   Procedure: PERIPHERAL VASCULAR INTERVENTION;  Surgeon: Nada Libman, MD;  Location: [facility];  Service: Cardiovascular;  Laterality: Left;   REVISION OF ARTERIOVENOUS GORETEX GRAFT Left 05/04/2018   Procedure: TRANSPOSITION OF CEPHALIC VEIN ARTERIOVENOUS FISTULA LEFT ARM;  Surgeon: Larina Earthly, MD;  Location: [facility] OR;  Service: Vascular;  Laterality: Left;   SAVORY DILATION N/A 12/18/2016   Procedure: SAVORY DILATION;  Surgeon: West Bali, MD;  [facility]: [facility];  Service: Endoscopy;  Laterality: N/A;   TRANSMETATARSAL AMPUTATION Left 08/15/2022   Procedure: TRANSMETATARSAL AMPUTATION;  Surgeon: [name], DPM;  Location: [facility] OR;  Service: Podiatry;  Laterality: Left;    Current Medications: No outpatient medications have been marked as taking for the 06/22/23 encounter (Appointment) with Chilton Si, MD.     Allergies:   Ace inhibitors, Penicillins, Statins, and Albuterol   Social History   Socioeconomic History   Marital status: Married    Spouse name: Shandi Godfrey   Number of children: 2   Years of education: 12   Highest education level: Associate degree: occupational, Scientist, product/process development, or vocational program  Occupational History   Occupation: retired   Tobacco Use   Smoking status: Never    Passive exposure: Never   Smokeless tobacco: Never   Tobacco comments:    Verified by Daughter, [name]  Vaping Use   Vaping status: Never Used  Substance and Sexual Activity   Alcohol use: No   Drug use: No   Sexual activity: Not  Currently    Partners: Male  Other Topics Concern   Not on file  Social History Narrative   Lives alone with husband    Right handed   Caffeine-1/2 daily   Social Drivers of Health   Financial Resource Strain: Low Risk  (05/03/2023)   Overall Financial Resource Strain (CARDIA)    Difficulty of Paying Living Expenses: Not hard at all  Food Insecurity: No Food Insecurity (05/03/2023)   Hunger Vital Sign    Worried About Running Out of Food in the Last Year: Never true    Ran Out of Food in the Last Year: Never true  Transportation Needs: No Transportation Needs (05/03/2023)   PRAPARE - Transportation  Lack of Transportation (Medical): No    Lack of Transportation (Non-Medical): No  Physical Activity: Inactive (05/03/2023)   Exercise Vital Sign    Days of Exercise per Week: 0 days    Minutes of Exercise per Session: 0 min  Stress: No Stress Concern Present (05/03/2023)   [employer] of Occupational Health - Occupational Stress Questionnaire    Feeling of Stress : Not at all  Social Connections: Socially Integrated (05/03/2023)   Social Connection and Isolation Panel [NHANES]    Frequency of Communication with Friends and Family: More than three times a week    Frequency of Social Gatherings with Friends and Family: More than three times a week    Attends Religious Services: More than 4 times per year    Active Member of [employer] or Organizations: Yes    Attends Engineer, structural: More than 4 times per year    Marital Status: Married     Family History: The patient's family history includes Arthritis in her father; Breast cancer in her sister; Hypercholesterolemia in her father and sister; Hypertension in her father, sister, and sister. There is no history of Colon cancer or Colon polyps.  ROS:   Please see the history of present illness.     All other systems reviewed and are negative.  EKGs/Labs/Other Studies Reviewed:    EKG:  EKG is not ordered today.    Echo 05/2023:   1. Left ventricular ejection fraction, by estimation, is 55%. The left  ventricle has normal function. The left ventricle has no regional wall  motion abnormalities. There is mild left ventricular hypertrophy. Left  ventricular diastolic parameters are  consistent with Grade I diastolic dysfunction (impaired relaxation).  Elevated left ventricular end-diastolic pressure.   2. Right ventricular systolic function is normal. The right ventricular  size is normal. Tricuspid regurgitation signal is inadequate for assessing  PA pressure.   3. Left atrial size was severely dilated.   4. The mitral valve is abnormal. Trivial mitral valve regurgitation. Mild  mitral stenosis. Severe mitral annular calcification.   5. The aortic valve has an indeterminant number of cusps. Aortic valve  regurgitation is not visualized. Moderate aortic valve stenosis. Aortic  valve area, by VTI measures 1.14 cm. Aortic valve mean gradient measures  21.0 mmHg. Aortic valve Vmax  measures 3.04 m/s.   6. The inferior vena cava is normal in size with greater than 50%  respiratory variability, suggesting right atrial pressure of 3 mmHg.   Recent Labs: 12/28/2022: ALT 42; B Natriuretic Peptide 2,982.0; Magnesium 2.2 05/02/2023: BUN 58; Creatinine, Ser 5.72; Hemoglobin 11.5; Platelets 264; Potassium 4.8; Sodium 140   Recent Lipid Panel    Component Value Date/Time   CHOL 123 12/18/2022 0413   TRIG 105 12/18/2022 0413   HDL 41 12/18/2022 0413   CHOLHDL 3.0 12/18/2022 0413   VLDL 21 12/18/2022 0413   LDLCALC 61 12/18/2022 0413   LDLCALC 109 (H) 11/12/2017 0936   LDLDIRECT 120.0 11/21/2020 1402    Physical Exam:    VS:  BP 139/65 (BP Location: Right Arm, Patient Position: Sitting, Cuff Size: Normal)   Pulse 91   Ht 5\' 4"  (1.626 m)   SpO2 96%   BMI 28.04 kg/m  , BMI Body mass index is 28.04 kg/m. GENERAL:  Chronically ill-appearing.  No acute distress.  HEENT: Pupils equal round and  reactive, fundi not visualized, oral mucosa unremarkable NECK:  No jugular venous distention, waveform within normal limits, carotid upstroke brisk and  symmetric, no bruits, no thyromegaly LUNGS:  Clear to auscultation bilaterally HEART:  RRR.  PMI not displaced or sustained,S1 and S2 within normal limits, no S3, no S4, no clicks, no rubs, no murmurs ABD:  Flat, positive bowel sounds normal in frequency in pitch, no bruits, no rebound, no guarding, no midline pulsatile mass, no hepatomegaly, no splenomegaly EXT:  bilateral BKA.  No edema.  SKIN:  No rashes no nodules NEURO:  Cranial nerves II through XII grossly intact, motor grossly intact throughout PSYCH:  Cognitively intact, oriented to person place and time   ASSESSMENT:    1. Malignant hypertension   2. Ischemic heart disease   3. PAD (peripheral artery disease) (HCC)   4. Cerebrovascular accident (CVA), unspecified mechanism (HCC)   5. OSA (obstructive sleep apnea)   6. Mixed hyperlipidemia   7. Class 1 obesity due to excess calories with serious comorbidity and body mass index (BMI) of 34.0 to 34.9 in adult   8. S/P BKA (below knee amputation), right (HCC)     PLAN:    Assessment and Plan Assessment & Plan # Aortic valve stenosis Echocardiogram reveals moderate aortic valve stenosis with a pressure gradient of 21 mmHg, indicating progression from mild to moderate. Currently asymptomatic but requires monitoring due to potential progression to severe stenosis. Discussed potential future intervention with TAVR if stenosis progresses to severe and becomes symptomatic. TAVR involves placing a new valve via the femoral artery without thoracotomy. - Monitor progression with regular follow-up echocardiograms. - Consider TAVR if stenosis progresses to severe and becomes symptomatic.  # Hypertension Hypertension with fluctuating blood pressure, particularly during dialysis. Current medications include amlodipine, hydralazine, and  metoprolol. Blood pressure decreases during dialysis, necessitating medication adjustments on dialysis days. Emphasized tracking blood pressure to identify patterns and guide medication adjustments. - Track blood pressure twice daily, including post-dialysis, to identify patterns and guide medication adjustments. - Continue amlodipine, hydralazine, and metoprolol, withholding on dialysis days. - Submit blood pressure readings via MyChart for review before the next appointment.  # PAD: # Hyperlipidemia: S/p bilateral BKA.  Continue aspirin, clopidogrel, and Zetia.  LDL was 61 on 11/2022.  # Chronic kidney disease on dialysis Undergoing thrice-weekly dialysis (Monday, Wednesday, Friday). Blood pressure management is complicated by intradialytic hypotension, requiring careful monitoring and medication adjustments. - Maintain current dialysis schedule. - Monitor blood pressure closely during and post-dialysis to guide medication adjustments.  # Anxiety Experiencing worsening anxiety with symptoms of nervousness, shakiness, and persistent thoughts. Currently on sertraline 50 mg. Discussed potential benefits of therapy in addition to medication, including techniques like box breathing. - Consult Dr. Lodema Hong regarding sertraline dosage adjustment or additional medication options. - Consider cognitive behavioral therapy or other supportive therapy. - Practice box breathing technique for anxiety management.  # Follow-up Advised to follow up for ongoing management of hypertension and aortic valve stenosis. - Schedule follow-up appointment in 1-2 months. - Submit blood pressure readings via MyChart before the next appointment for review.   Medication Adjustments/Labs and Tests Ordered: Current medicines are reviewed at length with the patient today.  Concerns regarding medicines are outlined above.  No orders of the defined types were placed in this encounter.  No orders of the defined types were  placed in this encounter.    Signed, Chilton Si, MD  06/22/2023 8:14 AM    [facility]

## 2023-06-23 ENCOUNTER — Telehealth (INDEPENDENT_AMBULATORY_CARE_PROVIDER_SITE_OTHER): Payer: Self-pay | Admitting: Otolaryngology

## 2023-06-23 DIAGNOSIS — N2581 Secondary hyperparathyroidism of renal origin: Secondary | ICD-10-CM | POA: Diagnosis not present

## 2023-06-23 DIAGNOSIS — Z992 Dependence on renal dialysis: Secondary | ICD-10-CM | POA: Diagnosis not present

## 2023-06-23 DIAGNOSIS — N186 End stage renal disease: Secondary | ICD-10-CM | POA: Diagnosis not present

## 2023-06-23 NOTE — Telephone Encounter (Signed)
 Confirmed appt & location [id number]

## 2023-06-24 ENCOUNTER — Encounter (INDEPENDENT_AMBULATORY_CARE_PROVIDER_SITE_OTHER): Payer: Self-pay

## 2023-06-24 ENCOUNTER — Telehealth: Payer: Self-pay

## 2023-06-24 ENCOUNTER — Ambulatory Visit (INDEPENDENT_AMBULATORY_CARE_PROVIDER_SITE_OTHER): Payer: Medicare Other | Admitting: Audiology

## 2023-06-24 ENCOUNTER — Ambulatory Visit (INDEPENDENT_AMBULATORY_CARE_PROVIDER_SITE_OTHER): Payer: Medicare Other | Admitting: Otolaryngology

## 2023-06-24 VITALS — BP 147/55 | HR 80

## 2023-06-24 DIAGNOSIS — H6123 Impacted cerumen, bilateral: Secondary | ICD-10-CM | POA: Diagnosis not present

## 2023-06-24 DIAGNOSIS — H60313 Diffuse otitis externa, bilateral: Secondary | ICD-10-CM

## 2023-06-24 DIAGNOSIS — H6993 Unspecified Eustachian tube disorder, bilateral: Secondary | ICD-10-CM | POA: Diagnosis not present

## 2023-06-24 DIAGNOSIS — H938X3 Other specified disorders of ear, bilateral: Secondary | ICD-10-CM

## 2023-06-24 DIAGNOSIS — Z9889 Other specified postprocedural states: Secondary | ICD-10-CM

## 2023-06-24 MED ORDER — OFLOXACIN 0.3 % OT SOLN: 4.0000 [drp] | Freq: Two times a day (BID) | OTIC | 1 refills | Status: AC

## 2023-06-24 NOTE — Progress Notes (Signed)
 Dear Dr. Lodema Hong, Here is my assessment for our mutual patient, Kathryn Beck. Thank you for allowing me the opportunity to care for your patient. Please do not hesitate to contact me should you have any other questions. Sincerely, Dr. Jovita Kussmaul  Otolaryngology Clinic Note Referring provider: Dr. Lodema Hong HPI:  Kathryn Beck is a 68 y.o. female kindly referred by Dr. Lodema Hong for evaluation of bilateral ear fullness and pressure.  Patient reports: she had a URI in Jan/Feb 2025 and since then reports she has had bilateral ear fullness and muffled hearing. She describes it as "popping" or "going through the mountains." She denies pain in the ear or drainage from the ear. Reports hearing is muffled but not decreased. She reports she has not tried any ear drops for this, but does report she took doxycycline and was admitted for CAP and prescribed prednisone and levaquin. This did not improve things significantly. 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 Suszanne Conners in 2019 with improvement. She does admit to using qtips and manipulating her ears regularly Patient currrently denies: ear pain, vertigo, drainage, tinnitus Patient also denies barotrauma, vestibular suppressant use, ototoxic medication use Prior ear surgery: BMT  Personal or FHx of bleeding dz or anesthesia difficulty: no   GLP-1: No AP/AC: ASA, Plavix  Tobacco: no  PMHx: DM, CHF, ESRD on Dialysis, HTN, Pancreatits, b/l LE amputations, AVS  Independent Review of Additional Tests or Records:  Dr. Pollyann Kennedy ([facility]) 06/17/2021 and 07/15/2021: noted left cerumen impaction, and exudate on right; prior h/o tubes by Dr. Suszanne Conners; some pain both ears, drainage right; uses q-tips; Rec ciprodex drops, otherwise f/u PRN Dr. Lodema Hong (04/27/2023) FM: URI sx including thick sputum, noted hearing loss and pressure in ears; Dx: Otalgia and HL; ref to ENT Admitted for CAP, RSV positive, on  levaquin and prednisone CBC and BMP 05/02/2023: WBC 4.9, Hgb 11.5, Plt 264; BUN/Cr 58/5.72 CT Face 12/17/2022 independently interpreted with respect to ears: noted well aerated mastoids and ME; otic capsule and ossicular chain unremarkable; modest cerumen/debris bilaterally, unable to visualize any PE tube or perforation PMH/Meds/All/SocHx/FamHx/ROS:   Past Medical History:  Diagnosis Date   Acid reflux    Amputated left leg (HCC)    Anemia    Arthritis    Axillary masses    Soft tissue - status post excision   Back pain    Cancer (HCC)    CHF (congestive heart failure) (HCC)    COVID-19 virus infection 04/06/2019   Depression    End-stage renal disease (HCC)    M/W/F dialysis   Essential hypertension    Headache    years ago   History of blood transfusion    History of cardiac catheterization    Normal coronary arteries October 2020   History of claustrophobia    History of pneumonia 2019   Hypoxia 04/03/2019   Memory loss    Mixed hyperlipidemia    Obesity    Pancreatitis    Peritoneal dialysis catheter in place [facility])    Pneumonia due to COVID-19 virus 04/02/2019   Sleep apnea    Noncompliant with CPAP   Stroke (HCC)    mini stroke   Type 2 diabetes mellitus (HCC)      Past Surgical History:  Procedure Laterality Date   ABDOMINAL AORTOGRAM W/LOWER EXTREMITY N/A 04/30/2022   Procedure: ABDOMINAL AORTOGRAM W/LOWER EXTREMITY;  Surgeon: Leonie Douglas, MD;  Location: [facility];  Service: Cardiovascular;  Laterality: N/A;   ABDOMINAL AORTOGRAM W/LOWER EXTREMITY N/A 07/21/2022   Procedure: ABDOMINAL AORTOGRAM W/LOWER EXTREMITY;  Surgeon: Nada Libman, MD;  Location: [facility];  Service: Cardiovascular;  Laterality: N/A;   ABDOMINAL HYSTERECTOMY     ACHILLES TENDON LENGTHENING  08/15/2022   Procedure: ACHILLES TENDON LENGTHENING;  Surgeon: [name], DPM;  Location: [facility] OR;  Service: Podiatry;;   AMPUTATION Right 05/29/2022   Procedure: RIGHT BELOW  THE KNEE AMPUTATION;  Surgeon: [name], MD;  Location: [facility];  Service: Orthopedics;  Laterality: Right;   AMPUTATION Left 09/04/2022   Procedure: AMPUTATION FOOT, serial irrigation;  Surgeon: Louann Sjogren, DPM;  Location: [facility] OR;  Service: Podiatry;  Laterality: Left;  Surgical team to do block   AMPUTATION Left 10/07/2022   Procedure: LEFT BELOW KNEE AMPUTATION;  Surgeon: [name], MD;  Location: [facility] OR;  Service: Orthopedics;  Laterality: Left;   AV FISTULA PLACEMENT Left 09/02/2017   Procedure: creation of left arm ARTERIOVENOUS (AV) FISTULA;  Surgeon: Nada Libman, MD;  Location: [facility] OR;  Service: Vascular;  Laterality: Left;   COLONOSCOPY  2008   Dr. Darrick Penna: normal    COLONOSCOPY N/A 12/18/2016   Dr. Darrick Penna: multiple tubular adenomas, internal hemorrhoids. Surveillance in 3 years    ESOPHAGEAL DILATION N/A 10/13/2015   Procedure: ESOPHAGEAL DILATION;  Surgeon: Malissa Hippo, MD;  Location: [facility];  Service: Endoscopy;  Laterality: N/A;   ESOPHAGOGASTRODUODENOSCOPY N/A 10/13/2015   Dr. Karilyn Cota: chronic gastritis on path, no H.pylori. Empiric dilation    ESOPHAGOGASTRODUODENOSCOPY N/A 12/18/2016   Dr. Darrick Penna: mild gastritis. BRAVO study revealed uncontrolled GERD. Dysphagia secondary to uncontrolled reflux   FOOT SURGERY Bilateral    "nerve"     LEFT HEART CATH AND CORONARY ANGIOGRAPHY N/A 12/29/2018   Procedure: LEFT HEART CATH AND CORONARY ANGIOGRAPHY;  Surgeon: Corky Crafts, MD;  Location: [facility] INVASIVE CV LAB;  Service: Cardiovascular;  Laterality: N/A;   LOWER EXTREMITY ANGIOGRAPHY Right 05/04/2022   Procedure: Lower Extremity Angiography;  Surgeon: Victorino Sparrow, MD;  Location: [facility] INVASIVE CV LAB;  Service: Cardiovascular;  Laterality: Right;   LUNG BIOPSY     MASS EXCISION Right 01/09/2013   Procedure: EXCISION OF NEOPLASM OF RIGHT  AXILLA  AND EXCISION OF NEOPLASM OF LEFT AXILLA;  Surgeon: Dalia Heading, MD;  Location: [facility] ORS;  Service: General;   Laterality: Right;  procedure end @ 08:23   MYRINGOTOMY WITH TUBE PLACEMENT Bilateral 04/28/2017   Procedure: BILATERAL MYRINGOTOMY WITH TUBE PLACEMENT;  Surgeon: Newman Pies, MD;  Location: [facility] OR;  Service: ENT;  Laterality: Bilateral;   PERIPHERAL VASCULAR BALLOON ANGIOPLASTY Right 05/04/2022   Procedure: PERIPHERAL VASCULAR BALLOON ANGIOPLASTY;  Surgeon: Victorino Sparrow, MD;  Location: [facility] INVASIVE CV LAB;  Service: Cardiovascular;  Laterality: Right;  PT   PERIPHERAL VASCULAR INTERVENTION Right 05/04/2022   Procedure: PERIPHERAL VASCULAR INTERVENTION;  Surgeon: Victorino Sparrow, MD;  Location: [facility] INVASIVE CV LAB;  Service: Cardiovascular;  Laterality: Right;  SFA   PERIPHERAL VASCULAR INTERVENTION Left 07/21/2022   Procedure: PERIPHERAL VASCULAR INTERVENTION;  Surgeon: Nada Libman, MD;  [facility]: [facility];  Service: Cardiovascular;  Laterality: Left;   REVISION OF ARTERIOVENOUS GORETEX GRAFT Left 05/04/2018   Procedure: TRANSPOSITION OF CEPHALIC VEIN ARTERIOVENOUS FISTULA LEFT ARM;  Surgeon: Larina Earthly, MD;  Location: [facility] OR;  Service: Vascular;  Laterality: Left;   SAVORY DILATION N/A 12/18/2016   Procedure: SAVORY DILATION;  Surgeon: West Bali, MD;  [facility]: [facility];  Service: Endoscopy;  Laterality: N/A;   TRANSMETATARSAL AMPUTATION Left 08/15/2022   Procedure: TRANSMETATARSAL AMPUTATION;  Surgeon: [name], DPM;  Location: [facility] OR;  Service: Podiatry;  Laterality: Left;    Family History  Problem Relation Age of Onset   Hypertension Father    Hypercholesterolemia Father    Arthritis Father    Hypertension Sister    Hypercholesterolemia Sister    Breast cancer Sister    Hypertension Sister    Colon cancer Neg Hx    Colon polyps Neg Hx      Social Connections: Socially Integrated (05/03/2023)   Social Connection and Isolation Panel [NHANES]    Frequency of Communication with Friends and Family: More than three times a week    Frequency of Social  Gatherings with Friends and Family: More than three times a week    Attends Religious Services: More than 4 times per year    Active Member of [employer] or Organizations: Yes    Attends Engineer, structural: More than 4 times per year    Marital Status: Married      Current Outpatient Medications:    amLODipine (NORVASC) 10 MG tablet, Take 1 tablet (10 mg total) by mouth daily., Disp: 90 tablet, Rfl: 1   aspirin 81 MG chewable tablet, Chew 2 tablets (162 mg total) by mouth daily., Disp: , Rfl:    B Complex-C-Folic Acid (RENA-VITE RX) 1 MG TABS, Take 1 tablet by mouth daily., Disp: , Rfl:    benzonatate (TESSALON) 100 MG capsule, Take one cpsule once daily for cough and congestion, as meeded, Disp: 10 capsule, Rfl: 0   busPIRone (BUSPAR) 5 MG tablet, Take 1 tablet (5 mg total) by mouth 2 (two) times daily., Disp: 60 tablet, Rfl: 3   cinacalcet (SENSIPAR) 30 MG tablet, Take 3 tablets (90 mg total) by mouth 3 (three) times a week. (Patient taking differently: Take 90 mg by mouth 3 (three) times a week. At dialysis MWF), Disp: 60 tablet, Rfl:    clopidogrel (PLAVIX) 75 MG tablet, Take 1 tablet (75 mg total) by mouth daily., Disp: 30 tablet, Rfl: 11   DULoxetine (CYMBALTA) 60 MG capsule, TAKE (1) CAPSULE BY MOUTH ONCE DAILY., Disp: 28 capsule, Rfl: 11   ezetimibe (ZETIA) 10 MG tablet, Take 1 tablet (10 mg total) by mouth daily., Disp: 28 tablet, Rfl: 11   FIASP FLEXTOUCH 100 UNIT/ML FlexTouch Pen, INJECT 18 TO 30 UNITS INTO THE SKIN WITH BREAKFAST, WITH LUNCH, AND WITH EVENING MEAL (Patient taking differently: Inject 1-10 Units into the skin 3 (three) times daily. Sliding scale), Disp: 90 mL, Rfl: 3   glucose blood (ONETOUCH VERIO) test strip, Use as instructed to check blood sugar 4X daily., Disp: 200 each, Rfl: 2   hydrALAZINE (APRESOLINE) 25 MG tablet, Take 25 mg by mouth 2 (two) times daily., Disp: , Rfl:    insulin glargine, 2 Unit Dial, (TOUJEO MAX SOLOSTAR) 300 UNIT/ML Solostar Pen,  Inject 30 Units into the skin daily. May need to slowly increase the dose depending upon your blood sugar, follow-up with PCP, Disp: 24 mL, Rfl: 1   metoprolol succinate (TOPROL-XL) 50 MG 24 hr tablet, TAKE (1) TABLET BY MOUTH DAILY WITH FOOD *TAKE AFTER DIALYSIS*, Disp: 28 tablet, Rfl: 11   ofloxacin (FLOXIN) 0.3 % OTIC solution, Place 4 drops into both ears 2 (two) times daily for 28 days., Disp: 10 mL, Rfl: 1   sertraline (ZOLOFT)  50 MG tablet, Take 50 mg by mouth daily., Disp: , Rfl:    torsemide (DEMADEX) 100 MG tablet, Take 100 mg by mouth See admin instructions. Take in Tues,Thursday,Sat & Sun, Disp: , Rfl:    [name] 500 MG chewable tablet, Chew 1-2 tablets (500-1,000 mg total) by mouth See admin instructions. Take 1000mg  (2 tablets) by mouth with meals and 500mg  (1 tablet) with snacks, Disp: 90 tablet, Rfl: 0   calcium acetate (PHOSLO) 667 MG tablet, Take 667 mg by mouth 3 (three) times daily., Disp: , Rfl:  No current facility-administered medications for this visit.  Facility-Administered Medications Ordered in Other Visits:    0.9 %  sodium chloride infusion, , Intravenous, Continuous, Lockamy, Randi L, NP-C, Stopped at 10/07/22 1422   Physical Exam:   BP (!) 147/55 (BP Location: Left Wrist, Cuff Size: Normal)   Pulse 80   SpO2 93%   Salient findings:  CN II-XII intact Given history and complaints, ear microscopy was indicated and performed for evaluation with findings as below in physical exam section and in procedures; bilateral cerumen impaction; after clearance, left EAC clear, some wet debris right on TM so cannot visualize TM comprehensively; right EAC clear, again some wet debris which was cleared as much as tolerated but unable to comprehensively visualize TM to assess but visualized posterior TM intact b/l Weber 512: mid Rinne 512: AC > BC b/l  Anterior rhinoscopy: Septum relatively midline; bilateral inferior turbinates without significant hypertrophy No lesions of oral  cavity No respiratory distress or stridor  Seprately Identifiable Procedures:  Procedure: Bilateral ear microscopy and cerumen removal using microscope (CPT 60454) - Mod 25 Pre-procedure diagnosis: Cerumen impaction bilateral external ears Post-procedure diagnosis: same Indication: bilateral cerumen impaction; given patient's otologic complaints and history as well as for improved and comprehensive examination of external ear and tympanic membrane, bilateral otologic examination using microscope was performed and impacted cerumen removed  Procedure: Patient was placed semi-recumbent. Both ear canals were examined using the microscope with findings above. Impacted Cerumen removed on left and on right using suction and currette with improvement in EAC examination and patency. Patient tolerated the procedure well.      Impression & Plans:  Kathryn Beck is a 68 y.o. female with:  1. Dysfunction of both eustachian tubes   2. Bilateral impacted cerumen   3. Sensation of fullness in both ears   4. Hx of tympanostomy tubes   5. Chronic diffuse otitis externa of both ears    Impactions cleared today but still complaining of fullness and ETD sx (which she has a history of with bilateral tymp tubes last in 2019 by Dr. Suszanne Conners) for ETD. Still has some debris in the ear on top of TM so unable to visualize TM comprehensively; as such, given debris and mild edema, will start her on ofloxacin BID x4 weeks (she cannot afford ciprodex prescribed prior) Stressed dry ear and avoiding qtips - f/u in 4-6 weeks with audio  See below regarding exact medications prescribed this encounter including dosages and route: Meds ordered this encounter  Medications   ofloxacin (FLOXIN) 0.3 % OTIC solution    Sig: Place 4 drops into both ears 2 (two) times daily for 28 days.    Dispense:  10 mL    Refill:  1      Thank you for allowing me the opportunity to care for your patient. Please do not hesitate to  contact me should you have any other questions.  Sincerely, Jovita Kussmaul, MD  Otolaryngologist (ENT), [facility] Phone: [phone] Fax: [phone]  06/24/2023, 11:29 AM   MDM:  Level 4 - 99204 Complexity/Problems addressed: mod - multiple chronic issues Data complexity: mod - independent review of notes, labs, ordering tests; independent interpretation CT - Morbidity: mod  - Prescription Drug prescribed or managed: yes

## 2023-06-24 NOTE — Patient Instructions (Signed)
 Don't use qtips Use ofloxacin ear drops four drops each ear twice per day for 4 weeks

## 2023-06-24 NOTE — Telephone Encounter (Signed)
 Wilber Bihari was initially denied due to the patient not having taken the injection in the last 365 days. The patient just had an injection on 05/27/23 so that should have been approved.   I called [phone] and started the appeal over the phone. It will take 30 calendar days to process. I will fax clinical information and injection appointment information to [phone] today.   Patient next dose is 08/31/23.  Case #[id number]  I will continue to update.

## 2023-06-25 DIAGNOSIS — S88912A Complete traumatic amputation of left lower leg, level unspecified, initial encounter: Secondary | ICD-10-CM | POA: Diagnosis not present

## 2023-06-25 DIAGNOSIS — I96 Gangrene, not elsewhere classified: Secondary | ICD-10-CM | POA: Diagnosis not present

## 2023-06-25 DIAGNOSIS — N2581 Secondary hyperparathyroidism of renal origin: Secondary | ICD-10-CM | POA: Diagnosis not present

## 2023-06-25 DIAGNOSIS — Z992 Dependence on renal dialysis: Secondary | ICD-10-CM | POA: Diagnosis not present

## 2023-06-25 DIAGNOSIS — N186 End stage renal disease: Secondary | ICD-10-CM | POA: Diagnosis not present

## 2023-06-28 DIAGNOSIS — Z992 Dependence on renal dialysis: Secondary | ICD-10-CM | POA: Diagnosis not present

## 2023-06-28 DIAGNOSIS — N2581 Secondary hyperparathyroidism of renal origin: Secondary | ICD-10-CM | POA: Diagnosis not present

## 2023-06-28 DIAGNOSIS — N186 End stage renal disease: Secondary | ICD-10-CM | POA: Diagnosis not present

## 2023-06-30 ENCOUNTER — Encounter (HOSPITAL_COMMUNITY): Payer: Self-pay | Admitting: Hematology

## 2023-06-30 ENCOUNTER — Encounter: Payer: Self-pay | Admitting: Family

## 2023-06-30 DIAGNOSIS — Z992 Dependence on renal dialysis: Secondary | ICD-10-CM | POA: Diagnosis not present

## 2023-06-30 DIAGNOSIS — N2581 Secondary hyperparathyroidism of renal origin: Secondary | ICD-10-CM | POA: Diagnosis not present

## 2023-06-30 DIAGNOSIS — N186 End stage renal disease: Secondary | ICD-10-CM | POA: Diagnosis not present

## 2023-06-30 NOTE — Telephone Encounter (Signed)
 Cailtin and Susa Day, patient has now been approved to continue getting Leqvio on her new insurance.  Auth Submission: APPROVED - Insurance change Site of care: Site of care: [facility] Payer: [employer] medicare Medication & CPT/J Code(s) submitted: Leqvio (Inclisiran) [phone] Route of submission (phone, fax, portal): portal Phone # Fax # Auth type: Buy/Bill PB Units/visits requested: 284mg  x 2 doses Reference number: [id number] Approval from: 06/22/23 to 06/24/24

## 2023-07-02 DIAGNOSIS — Z992 Dependence on renal dialysis: Secondary | ICD-10-CM | POA: Diagnosis not present

## 2023-07-02 DIAGNOSIS — N2581 Secondary hyperparathyroidism of renal origin: Secondary | ICD-10-CM | POA: Diagnosis not present

## 2023-07-02 DIAGNOSIS — N186 End stage renal disease: Secondary | ICD-10-CM | POA: Diagnosis not present

## 2023-07-05 DIAGNOSIS — Z89511 Acquired absence of right leg below knee: Secondary | ICD-10-CM | POA: Diagnosis not present

## 2023-07-05 DIAGNOSIS — S88912A Complete traumatic amputation of left lower leg, level unspecified, initial encounter: Secondary | ICD-10-CM | POA: Diagnosis not present

## 2023-07-05 DIAGNOSIS — N2581 Secondary hyperparathyroidism of renal origin: Secondary | ICD-10-CM | POA: Diagnosis not present

## 2023-07-05 DIAGNOSIS — I96 Gangrene, not elsewhere classified: Secondary | ICD-10-CM | POA: Diagnosis not present

## 2023-07-05 DIAGNOSIS — Z992 Dependence on renal dialysis: Secondary | ICD-10-CM | POA: Diagnosis not present

## 2023-07-05 DIAGNOSIS — N186 End stage renal disease: Secondary | ICD-10-CM | POA: Diagnosis not present

## 2023-07-07 ENCOUNTER — Ambulatory Visit (INDEPENDENT_AMBULATORY_CARE_PROVIDER_SITE_OTHER): Admitting: Neurology

## 2023-07-07 ENCOUNTER — Encounter: Payer: Self-pay | Admitting: Neurology

## 2023-07-07 VITALS — BP 140/82 | HR 89 | Ht 59.0 in

## 2023-07-07 DIAGNOSIS — G5603 Carpal tunnel syndrome, bilateral upper limbs: Secondary | ICD-10-CM | POA: Diagnosis not present

## 2023-07-07 DIAGNOSIS — I63512 Cerebral infarction due to unspecified occlusion or stenosis of left middle cerebral artery: Secondary | ICD-10-CM

## 2023-07-07 DIAGNOSIS — N2581 Secondary hyperparathyroidism of renal origin: Secondary | ICD-10-CM | POA: Diagnosis not present

## 2023-07-07 DIAGNOSIS — Z992 Dependence on renal dialysis: Secondary | ICD-10-CM | POA: Diagnosis not present

## 2023-07-07 DIAGNOSIS — N186 End stage renal disease: Secondary | ICD-10-CM | POA: Diagnosis not present

## 2023-07-07 MED ORDER — GABAPENTIN 100 MG PO CAPS: 100.0000 mg | ORAL_CAPSULE | Freq: Three times a day (TID) | ORAL | 3 refills | Status: DC
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
## 2023-07-07 NOTE — Patient Instructions (Addendum)
 Decrease aspirin to 81 mg daily from 162 mg daily Continue Plavix 75 mg daily Start gabapentin 100 mg twice daily, can increase to 100 mg 3 times daily for neuropathic pain and monitor for side effect Continue your other medications EMG nerve conduction study for bilateral carpal tunnel syndrome Consider bilateral wrist braces Follow-up in 6 months or sooner if worse.

## 2023-07-08 ENCOUNTER — Ambulatory Visit: Payer: Self-pay

## 2023-07-08 NOTE — Telephone Encounter (Signed)
 Copied from CRM [id number]. Topic: Clinical - Red Word Triage >> Jul 08, 2023  8:43 AM Kathryn Beck wrote: Red Word that prompted transfer to Nurse Triage: Nerves bothering her really bad, shaking, likes she's nervous all the time - maybe from having a lot going on from having legs amputated. Also has hemmroids  Chief Complaint: Nervousness Symptoms: Shakiness Frequency: A few months Pertinent Negatives: Patient denies SI Disposition: [] ED /[] Urgent Care (no appt availability in office) / [x] Appointment(In office/virtual)/ []  [facility]/ [] Home Care/ [] Refused Recommended Disposition /[] [place] [facility]/ []  Follow-up with PCP Additional Notes: Patient called in to report an exacerbation of her nerves/anxiety. 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 Patient denied SI and thoughts of harming others at this time. Patient has a support system at home. Patient stated she already takes a medication for her nerves, but could not recall the name of the medication. Advised patient to be seen within 3 days, per protocol. No availability with PCP/PCP office. Provided care advice and instructed patient to call back if symptoms worsen. Patient complied. Advised that office would be reaching out to schedule. Please advise.   Reason for Disposition  MODERATE anxiety (e.g., persistent or frequent anxiety symptoms; interferes with sleep, school, or work)  Answer Assessment - Initial Assessment Questions 1. CONCERN: "Did anything happen that prompted you to call today?"      "Nerves"/shaking 2. ANXIETY SYMPTOMS: "Can you describe how you (your loved one; patient) have been feeling?" (e.g., tense, restless, panicky, anxious, keyed up, overwhelmed, sense of impending doom).      States she feels "nervous", shakiness at random times 3. ONSET: "How long have you been feeling this way?" (e.g., hours, days, weeks)     A  few months 4. SEVERITY: "How would you rate the level of anxiety?" (e.g., 0 - 10; or mild, moderate, severe).     Rates anxiety an 8  5. FUNCTIONAL IMPAIRMENT: "How have these feelings affected your ability to do daily activities?" "Have you had more difficulty than usual doing your normal daily activities?" (e.g., getting better, same, worse; self-care, school, work, interactions)     States she has been able to maintain a normal routine 6. HISTORY: "Have you felt this way before?" "Have you ever been diagnosed with an anxiety problem in the past?" (e.g., generalized anxiety disorder, panic attacks, PTSD). If Yes, ask: "How was this problem treated?" (e.g., medicines, counseling, etc.)     Yes, states she has a history of nervousness 7. RISK OF HARM - SUICIDAL IDEATION: "Do you ever have thoughts of hurting or killing yourself?" If Yes, ask:  "Do you have these feelings now?" "Do you have a plan on how you would do this?"     Denies 8. TREATMENT:  "What has been done so far to treat this anxiety?" (e.g., medicines, relaxation strategies). "What has helped?"     States she is currently on a medication to help with her nerves, cannot recall name, states medication is no longer helping 9. TREATMENT - THERAPIST: "Do you have a counselor or therapist? Name?"     States she has "talked to someone in the past", open to talking to someone now 10. POTENTIAL TRIGGERS: "Do you drink caffeinated beverages (e.g., coffee, colas, teas), and how much daily?" "Do you drink alcohol or use any drugs?" "Have you started any new medicines recently?"       Double  foot amputation and dialysis- her health 11. PATIENT SUPPORT: "Who is with you now?" "Who do you live with?" "Do you have family or friends who you can talk to?"        Husband, daughter, son and sister 46. OTHER SYMPTOMS: "Do you have any other symptoms?" (e.g., feeling depressed, trouble concentrating, trouble sleeping, trouble breathing, palpitations or fast  heartbeat, chest pain, sweating, nausea, or diarrhea)       States "sometimes her BP drops"- cannot recall reading, states this occurs at dialysis, hemorrhoids  Protocols used: Anxiety and Panic Attack-A-AH

## 2023-07-08 NOTE — Telephone Encounter (Signed)
 Scheduled

## 2023-07-09 ENCOUNTER — Telehealth: Payer: Self-pay | Admitting: Family Medicine

## 2023-07-09 DIAGNOSIS — Z992 Dependence on renal dialysis: Secondary | ICD-10-CM | POA: Diagnosis not present

## 2023-07-09 DIAGNOSIS — N2581 Secondary hyperparathyroidism of renal origin: Secondary | ICD-10-CM | POA: Diagnosis not present

## 2023-07-09 DIAGNOSIS — N186 End stage renal disease: Secondary | ICD-10-CM | POA: Diagnosis not present

## 2023-07-09 NOTE — Telephone Encounter (Signed)
 Please advise, patient seeing Leita Longs.   Copied from CRM [id number]. Topic: Appointments - Scheduling Inquiry for Clinic >> Jul 09, 2023 11:22 AM Powell HERO wrote: Reason for CRM: Augusto  calling on behalf of her mother, would like to know if the appointment on 4/23 could be virtual. Please call to advise.

## 2023-07-12 DIAGNOSIS — N186 End stage renal disease: Secondary | ICD-10-CM | POA: Diagnosis not present

## 2023-07-12 DIAGNOSIS — N2581 Secondary hyperparathyroidism of renal origin: Secondary | ICD-10-CM | POA: Diagnosis not present

## 2023-07-12 DIAGNOSIS — Z992 Dependence on renal dialysis: Secondary | ICD-10-CM | POA: Diagnosis not present

## 2023-07-12 NOTE — Telephone Encounter (Signed)
 Will change to virtual cma sent back to admin pool

## 2023-07-14 ENCOUNTER — Telehealth

## 2023-07-14 DIAGNOSIS — N186 End stage renal disease: Secondary | ICD-10-CM | POA: Diagnosis not present

## 2023-07-14 DIAGNOSIS — N2581 Secondary hyperparathyroidism of renal origin: Secondary | ICD-10-CM | POA: Diagnosis not present

## 2023-07-14 DIAGNOSIS — Z992 Dependence on renal dialysis: Secondary | ICD-10-CM | POA: Diagnosis not present

## 2023-07-15 ENCOUNTER — Encounter: Payer: Self-pay | Admitting: Adult Health

## 2023-07-15 ENCOUNTER — Encounter: Admitting: Adult Health

## 2023-07-16 DIAGNOSIS — N2581 Secondary hyperparathyroidism of renal origin: Secondary | ICD-10-CM | POA: Diagnosis not present

## 2023-07-16 DIAGNOSIS — N186 End stage renal disease: Secondary | ICD-10-CM | POA: Diagnosis not present

## 2023-07-16 DIAGNOSIS — Z992 Dependence on renal dialysis: Secondary | ICD-10-CM | POA: Diagnosis not present

## 2023-07-20 ENCOUNTER — Encounter: Payer: Self-pay | Admitting: Family Medicine

## 2023-07-20 ENCOUNTER — Telehealth: Payer: Self-pay

## 2023-07-20 NOTE — Telephone Encounter (Signed)
 Called patient, spoke to daughter and she will make sure she makes it to the 06/10 appointment.

## 2023-07-20 NOTE — Telephone Encounter (Signed)
 Patient has missed 4 appointments with PCP. Per [facility] policy, patient can be considered for dismissal.   Alternatively, Front Office can attempt to contact patient for rescheduling. Please advise.

## 2023-07-21 ENCOUNTER — Telehealth: Payer: Self-pay

## 2023-07-21 DIAGNOSIS — N2581 Secondary hyperparathyroidism of renal origin: Secondary | ICD-10-CM | POA: Diagnosis not present

## 2023-07-21 DIAGNOSIS — Z992 Dependence on renal dialysis: Secondary | ICD-10-CM | POA: Diagnosis not present

## 2023-07-21 DIAGNOSIS — N186 End stage renal disease: Secondary | ICD-10-CM | POA: Diagnosis not present

## 2023-07-21 NOTE — Telephone Encounter (Signed)
 Spoke to pt daughter. Advised ED or Urgent Care to be evaluated, pt daughter agreed

## 2023-07-21 NOTE — Telephone Encounter (Signed)
 Copied from CRM [id number]. Topic: General - Other >> Jul 21, 2023  3:01 PM Tiffany S wrote: Reason for CRM: Patient had a fall and wondering if she could xray to make sure she hasn't fractured her right arm and shoulder

## 2023-07-23 ENCOUNTER — Ambulatory Visit: Payer: Self-pay

## 2023-07-23 DIAGNOSIS — N186 End stage renal disease: Secondary | ICD-10-CM | POA: Diagnosis not present

## 2023-07-23 DIAGNOSIS — Z992 Dependence on renal dialysis: Secondary | ICD-10-CM | POA: Diagnosis not present

## 2023-07-23 DIAGNOSIS — I959 Hypotension, unspecified: Secondary | ICD-10-CM | POA: Diagnosis not present

## 2023-07-23 DIAGNOSIS — N2581 Secondary hyperparathyroidism of renal origin: Secondary | ICD-10-CM | POA: Diagnosis not present

## 2023-07-23 NOTE — Telephone Encounter (Signed)
 Copied from CRM [id number]. Topic: Clinical - Red Word Triage >> Jul 23, 2023 11:16 AM Baldomero Bone wrote: Red Word that prompted transfer to Nurse Triage: Patient fell on Sunday, and experiencing right arm pain. Pain is getting worse. Callback number [phone]   Chief Complaint: Kathryn Beck at home Sunday, slipped between chair and bed. Right arm pain. Symptoms: Above Frequency: Sunday Pertinent Negatives: Patient denies no swelling or bruising. Disposition: [] ED /[] Urgent Care (no appt availability in office) / [x] Appointment(In office/virtual)/ []  [place] [facility]/ [] Home Care/ [] Refused Recommended Disposition /[] [facility]/ []  Follow-up with PCP Additional Notes: Agrees with appointment.  Reason for Disposition  MILD weakness (i.e., does not interfere with ability to work, go to school, normal activities)  (Exception: Mild weakness is a chronic symptom.)  Answer Assessment - Initial Assessment Questions 1. MECHANISM: "How did the fall happen?"     Slipped between bed and chair 2. DOMESTIC VIOLENCE AND ELDER ABUSE SCREENING: "Did you fall because someone pushed you or tried to hurt you?" If Yes, ask: "Are you safe now?"     no 3. ONSET: "When did the fall happen?" (e.g., minutes, hours, or days ago)     Sunday 4. LOCATION: "What part of the body hit the ground?" (e.g., back, buttocks, head, hips, knees, hands, head, stomach)     Right arm 5. INJURY: "Did you hurt (injure) yourself when you fell?" If Yes, ask: "What did you injure? Tell me more about this?" (e.g., body area; type of injury; pain severity)"     Right arm 6. PAIN: "Is there any pain?" If Yes, ask: "How bad is the pain?" (e.g., Scale 1-10; or mild,  moderate, severe)   - NONE (0): No pain   - MILD (1-3): Doesn't interfere with normal activities    - MODERATE (4-7): Interferes with normal activities or awakens from sleep    - SEVERE (8-10): Excruciating pain, unable to do any normal activities       Mild-moderate 7. SIZE: For cuts, bruises, or swelling, ask: "How large is it?" (e.g., inches or centimeters)      No 8. PREGNANCY: "Is there any chance you are pregnant?" "When was your last menstrual period?"     No 9. OTHER SYMPTOMS: "Do you have any other symptoms?" (e.g., dizziness, fever, weakness; new onset or worsening).      No 10. CAUSE: "What do you think caused the fall (or falling)?" (e.g., tripped, dizzy spell)       Slipped  Protocols used: Falls and [facility]

## 2023-07-23 NOTE — Telephone Encounter (Signed)
Noted appointment scheduled.

## 2023-07-25 DIAGNOSIS — S88912A Complete traumatic amputation of left lower leg, level unspecified, initial encounter: Secondary | ICD-10-CM | POA: Diagnosis not present

## 2023-07-25 DIAGNOSIS — I96 Gangrene, not elsewhere classified: Secondary | ICD-10-CM | POA: Diagnosis not present

## 2023-07-27 ENCOUNTER — Ambulatory Visit (HOSPITAL_COMMUNITY)
Admission: RE | Admit: 2023-07-27 | Discharge: 2023-07-27 | Disposition: A | Discharge status: Home / Self Care | Source: Ambulatory Visit

## 2023-07-27 ENCOUNTER — Ambulatory Visit

## 2023-07-27 VITALS — BP 144/73 | HR 54 | Ht 62.0 in | Wt 168.0 lb

## 2023-07-27 DIAGNOSIS — M25511 Pain in right shoulder: Secondary | ICD-10-CM

## 2023-07-27 DIAGNOSIS — M1812 Unilateral primary osteoarthritis of first carpometacarpal joint, left hand: Secondary | ICD-10-CM | POA: Diagnosis not present

## 2023-07-27 DIAGNOSIS — M1811 Unilateral primary osteoarthritis of first carpometacarpal joint, right hand: Secondary | ICD-10-CM | POA: Diagnosis not present

## 2023-07-27 DIAGNOSIS — L03011 Cellulitis of right finger: Secondary | ICD-10-CM

## 2023-07-27 DIAGNOSIS — I959 Hypotension, unspecified: Secondary | ICD-10-CM | POA: Diagnosis not present

## 2023-07-27 DIAGNOSIS — Z992 Dependence on renal dialysis: Secondary | ICD-10-CM | POA: Diagnosis not present

## 2023-07-27 DIAGNOSIS — M25532 Pain in left wrist: Secondary | ICD-10-CM | POA: Insufficient documentation

## 2023-07-27 DIAGNOSIS — M25531 Pain in right wrist: Secondary | ICD-10-CM | POA: Diagnosis not present

## 2023-07-27 DIAGNOSIS — N186 End stage renal disease: Secondary | ICD-10-CM | POA: Diagnosis not present

## 2023-07-27 DIAGNOSIS — N2581 Secondary hyperparathyroidism of renal origin: Secondary | ICD-10-CM | POA: Diagnosis not present

## 2023-07-27 MED ORDER — SULFAMETHOXAZOLE-TRIMETHOPRIM 800-160 MG PO TABS: 1.0000 | ORAL_TABLET | Freq: Two times a day (BID) | ORAL | 0 refills | Status: AC

## 2023-07-27 NOTE — Progress Notes (Unsigned)
   Acute Office Visit  Subjective:     Patient ID: Kathryn Beck, female    DOB: [birth year], 68 y.o.   MRN: [id number]  Chief Complaint  Patient presents with   Medical Management of Chronic Issues    Pt states "Right hand injury requesting xrays of both hands and arms"    HPI Patient is in today for hand pain and right arm pain  Review of Systems  Constitutional: Negative.   HENT: Negative.    Eyes: Negative.   Respiratory: Negative.    Cardiovascular: Negative.   Gastrointestinal: Negative.   Genitourinary: Negative.   Musculoskeletal:  Positive for falls (fell recently when transferring from chair to bed), joint pain (bilat wrist) and myalgias (right upper arm).  Skin: Negative.   Neurological: Negative.   Psychiatric/Behavioral: Negative.          Objective:    BP (!) 144/73   Pulse (!) 54   Ht 5\' 2"  (1.575 m)   Wt 168 lb (76.2 kg)   SpO2 96%   BMI 30.73 kg/m    Physical Exam Vitals reviewed.  Constitutional:      General: She is not in acute distress.    Appearance: She is not diaphoretic.  HENT:     Head: Normocephalic and atraumatic.     Nose: Nose normal. No congestion.     Mouth/Throat:     Mouth: Mucous membranes are moist.     Pharynx: No posterior oropharyngeal erythema.  Eyes:     General: No scleral icterus.    Extraocular Movements: Extraocular movements intact.  Cardiovascular:     Rate and Rhythm: Normal rate and regular rhythm.     Heart sounds: Murmur (Systolic) heard.  Pulmonary:     Breath sounds: Normal breath sounds. No wheezing or rales.  Musculoskeletal:     Cervical back: Neck supple. No tenderness.     Comments: S/p b/l BKA  Skin:    General: Skin is warm.     Findings: No rash.     Comments: Right third digit - dark brown discoloration near fingernail, no tenderness or local discharge  Neurological:     General: No focal deficit present.     Mental Status: She is alert and oriented to person, place, and time.   Psychiatric:        Mood and Affect: Mood normal.        Behavior: Behavior normal.     No results found for any visits on 07/27/23.      Assessment & Plan:   Problem List Items Addressed This Visit       Other   Shoulder pain   Relevant Orders   DG Shoulder Right (Completed)   Other Visit Diagnoses       Pain in both wrists    -  Primary   Relevant Orders   DG Wrist Complete Left (Completed)   DG Wrist Complete Right (Completed)     Cellulitis of fingernail of right hand       Relevant Medications   sulfamethoxazole -trimethoprim  (BACTRIM  DS) 800-160 MG tablet       Meds ordered this encounter  Medications   sulfamethoxazole -trimethoprim  (BACTRIM  DS) 800-160 MG tablet    Sig: Take 1 tablet by mouth 2 (two) times daily for 7 days.    Dispense:  14 tablet    Refill:  0    No follow-ups on file.  Alison Irvine, FNP

## 2023-07-28 DIAGNOSIS — Z992 Dependence on renal dialysis: Secondary | ICD-10-CM | POA: Diagnosis not present

## 2023-07-28 DIAGNOSIS — N186 End stage renal disease: Secondary | ICD-10-CM | POA: Diagnosis not present

## 2023-07-28 DIAGNOSIS — N2581 Secondary hyperparathyroidism of renal origin: Secondary | ICD-10-CM | POA: Diagnosis not present

## 2023-07-28 DIAGNOSIS — I959 Hypotension, unspecified: Secondary | ICD-10-CM | POA: Diagnosis not present

## 2023-07-30 DIAGNOSIS — Z992 Dependence on renal dialysis: Secondary | ICD-10-CM | POA: Diagnosis not present

## 2023-07-30 DIAGNOSIS — N186 End stage renal disease: Secondary | ICD-10-CM | POA: Diagnosis not present

## 2023-07-30 DIAGNOSIS — I959 Hypotension, unspecified: Secondary | ICD-10-CM | POA: Diagnosis not present

## 2023-07-30 DIAGNOSIS — N2581 Secondary hyperparathyroidism of renal origin: Secondary | ICD-10-CM | POA: Diagnosis not present

## 2023-08-02 DIAGNOSIS — N2581 Secondary hyperparathyroidism of renal origin: Secondary | ICD-10-CM | POA: Diagnosis not present

## 2023-08-02 DIAGNOSIS — I959 Hypotension, unspecified: Secondary | ICD-10-CM | POA: Diagnosis not present

## 2023-08-02 DIAGNOSIS — N186 End stage renal disease: Secondary | ICD-10-CM | POA: Diagnosis not present

## 2023-08-02 DIAGNOSIS — Z992 Dependence on renal dialysis: Secondary | ICD-10-CM | POA: Diagnosis not present

## 2023-08-02 DIAGNOSIS — E1122 Type 2 diabetes mellitus with diabetic chronic kidney disease: Secondary | ICD-10-CM | POA: Diagnosis not present

## 2023-08-03 ENCOUNTER — Ambulatory Visit (INDEPENDENT_AMBULATORY_CARE_PROVIDER_SITE_OTHER): Admitting: Audiology

## 2023-08-03 ENCOUNTER — Encounter (INDEPENDENT_AMBULATORY_CARE_PROVIDER_SITE_OTHER): Payer: Self-pay | Admitting: Otolaryngology

## 2023-08-03 ENCOUNTER — Ambulatory Visit (INDEPENDENT_AMBULATORY_CARE_PROVIDER_SITE_OTHER): Admitting: Otolaryngology

## 2023-08-03 VITALS — BP 154/77 | HR 67

## 2023-08-03 DIAGNOSIS — Z09 Encounter for follow-up examination after completed treatment for conditions other than malignant neoplasm: Secondary | ICD-10-CM | POA: Diagnosis not present

## 2023-08-03 DIAGNOSIS — H90A31 Mixed conductive and sensorineural hearing loss, unilateral, right ear with restricted hearing on the contralateral side: Secondary | ICD-10-CM

## 2023-08-03 DIAGNOSIS — H6121 Impacted cerumen, right ear: Secondary | ICD-10-CM

## 2023-08-03 DIAGNOSIS — Z8669 Personal history of other diseases of the nervous system and sense organs: Secondary | ICD-10-CM

## 2023-08-03 DIAGNOSIS — H938X3 Other specified disorders of ear, bilateral: Secondary | ICD-10-CM | POA: Diagnosis not present

## 2023-08-03 DIAGNOSIS — H6993 Unspecified Eustachian tube disorder, bilateral: Secondary | ICD-10-CM

## 2023-08-03 DIAGNOSIS — H60313 Diffuse otitis externa, bilateral: Secondary | ICD-10-CM

## 2023-08-03 DIAGNOSIS — H9042 Sensorineural hearing loss, unilateral, left ear, with unrestricted hearing on the contralateral side: Secondary | ICD-10-CM | POA: Diagnosis not present

## 2023-08-03 DIAGNOSIS — H9071 Mixed conductive and sensorineural hearing loss, unilateral, right ear, with unrestricted hearing on the contralateral side: Secondary | ICD-10-CM | POA: Diagnosis not present

## 2023-08-03 DIAGNOSIS — Z9889 Other specified postprocedural states: Secondary | ICD-10-CM

## 2023-08-03 NOTE — Progress Notes (Deleted)
  [address] [place], [place] [address] [phone]  Audiological Evaluation    Name: Kathryn Beck     DOB:   18-Jun-1955      MRN:   [id number]                                                                                     Service Date: 08/03/2023     Accompanied by: husband   Patient comes today after Dr. Lydia Sams, ENT sent a referral for a hearing evaluation due to concerns with hearing loss.   Symptoms Yes Details  Hearing loss  [x]  Right sided hearing loss   Tinnitus  [x]  Sometimes - both ears  Ear pain/ infections/pressure  [x]  Right ear drainage- some crusting observed  Balance problems  [x]  Some spinning when she gets up  and at other times - short duration - per patient  Noise exposure history  []    Previous ear surgeries  [x]  Reports BMT twice as an adult.  Family history of hearing loss  []    Amplification  []    Other  []      Otoscopy: Right ear: Abnormal eardrum appearance. Left ear:  Some parts of the eardrum were visualized.  Tympanometry: Right ear: Could not obtain seal; middle ear status is unable to be determined at this time.. Left ear: Type As- Normal external ear canal volume with normal middle ear pressure and low tympanic membrane compliance.   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  Speech Audiometry: Right ear- Speech Reception Threshold (SRT) was obtained at 75 dBHL, with contralateral masking. Left ear-Speech Reception Threshold (SRT) was obtained at 35 dBHL.   Word Recognition Score Tested using NU-6 (MLV) Right ear: 96% was obtained at a presentation level of 100 dBHL with contralateral masking which is deemed as  excellent. Left ear: 92% was obtained at a presentation level of 90 dBHL with contralateral masking which is deemed as  excellent.   The hearing test results  were completed under headphones and results are deemed to be of good to fair reliability. Test technique:  conventional      Recommendations: Follow up with ENT as scheduled for today. Repeat audiogram after medical care.  [name] MARIE LEROUX-MARTINEZ, AUD

## 2023-08-03 NOTE — Progress Notes (Signed)
  [address] [place], [place] [address] [phone]  Audiological Evaluation    Name: Kathryn Beck     DOB:   18-Jun-1955      MRN:   [id number]                                                                                     Service Date: 08/03/2023     Accompanied by: husband   Patient comes today after Dr. Lydia Sams, ENT sent a referral for a hearing evaluation due to concerns with hearing loss.   Symptoms Yes Details  Hearing loss  [x]  Right sided hearing loss   Tinnitus  [x]  Sometimes - both ears  Ear pain/ infections/pressure  [x]  Right ear drainage- some crusting observed  Balance problems  [x]  Some spinning when she gets up  and at other times - short duration - per patient  Noise exposure history  []    Previous ear surgeries  [x]  Reports BMT twice as an adult.  Family history of hearing loss  []    Amplification  []    Other  []      Otoscopy: Right ear: Abnormal eardrum appearance. Left ear:  Some parts of the eardrum were visualized.  Tympanometry: Right ear: Could not obtain seal; middle ear status is unable to be determined at this time.. Left ear: Type As- Normal external ear canal volume with normal middle ear pressure and low tympanic membrane compliance.   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  Speech Audiometry: Right ear- Speech Reception Threshold (SRT) was obtained at 75 dBHL, with contralateral masking. Left ear-Speech Reception Threshold (SRT) was obtained at 35 dBHL.   Word Recognition Score Tested using NU-6 (MLV) Right ear: 96% was obtained at a presentation level of 100 dBHL with contralateral masking which is deemed as  excellent. Left ear: 92% was obtained at a presentation level of 90 dBHL with contralateral masking which is deemed as  excellent.   The hearing test results  were completed under headphones and results are deemed to be of good to fair reliability. Test technique:  conventional      Recommendations: Follow up with ENT as scheduled for today. Repeat audiogram after medical care.  [name] MARIE LEROUX-MARTINEZ, AUD

## 2023-08-03 NOTE — Progress Notes (Signed)
 Dear Dr. Rodolph Clap, Here is my assessment for our mutual patient, Kathryn Beck. Thank you for allowing me the opportunity to care for your patient. Please do not hesitate to contact me should you have any other questions. Sincerely, Dr. Milon Aloe  Otolaryngology Clinic Note Referring provider: Dr. Rodolph Clap HPI:  Kathryn Beck is a 68 y.o. female kindly referred by Dr. Rodolph Clap for evaluation of bilateral ear fullness and pressure.  Initial visit (06/2023): Patient reports: she had a URI in Jan/Feb 2025 and since then reports she has had bilateral ear fullness and muffled hearing. She describes it as "popping" or "going through the mountains." She denies pain in the ear or drainage from the ear. Reports hearing is muffled but not decreased. She reports she has not tried any ear drops for this, but does report she took doxycycline  and while she was admitted for CAP and prescribed prednisone  and levaquin . This did not improve things significantly. 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 Darlin Ehrlich in 2019 with improvement. She does admit to using qtips and manipulating her ears regularly Patient currrently denies: ear pain, vertigo, drainage, tinnitus Patient also denies barotrauma, vestibular suppressant use, ototoxic medication use Prior ear surgery: BMT  --------------------------------------------------------- 08/03/2023 Returns for follow up. Reports symptoms are some better but still having bilateral ear fullness without pain or drainage. She did use floxin  drops but ran out. She did have an audiogram today. Hearing is worse on right   Personal or FHx of bleeding dz or anesthesia difficulty: no   GLP-1: No AP/AC: ASA, Plavix   Tobacco: no  PMHx: DM, CHF, ESRD on Dialysis, HTN, Pancreatits, b/l LE amputations, AVS  Independent Review of Additional Tests or Records:  Dr. Donalee Fruits ([facility]) 06/17/2021 and 07/15/2021: noted left  cerumen impaction, and exudate on right; prior h/o tubes by Dr. Darlin Ehrlich; some pain both ears, drainage right; uses q-tips; Rec ciprodex  drops, otherwise f/u PRN Dr. Rodolph Clap (04/27/2023) FM: URI sx including thick sputum, noted hearing loss and pressure in ears; Dx: Otalgia and HL; ref to ENT Admitted for CAP, RSV positive, on levaquin  and prednisone  CBC and BMP 05/02/2023: WBC 4.9, Hgb 11.5, Plt 264; BUN/Cr 58/5.72 CT Face 12/17/2022 independently interpreted with respect to ears: noted well aerated mastoids and ME; otic capsule and ossicular chain unremarkable; modest cerumen/debris bilaterally, unable to visualize any PE tube or perforation 07/2023 Audiogram was independently reviewed and interpreted by me and it reveals - downsloping SNHL AS with 92% WRT at 90dB; type; As tymp AS; on right, noted significant CHL with ABG as below in lower frequencies with WRT 96% at 100dB; large vol? Tymp (could not obtain seal)  SNHL= Sensorineural hearing loss  PMH/Meds/All/SocHx/FamHx/ROS:   Past Medical History:  Diagnosis Date   Acid reflux    Amputated left leg (HCC)    Anemia    Arthritis    Axillary masses    Soft tissue - status post excision   Back pain    Cancer (HCC)    CHF (congestive heart failure) (HCC)    COVID-19 virus infection 04/06/2019   Depression    End-stage renal disease (HCC)    M/W/F dialysis   Essential hypertension    Headache    years ago   History of blood transfusion    History of cardiac catheterization    Normal coronary arteries October 2020   History of claustrophobia    History of pneumonia 2019   Hypoxia  04/03/2019   Memory loss    Mixed hyperlipidemia    Obesity    Pancreatitis    Peritoneal dialysis catheter in place [facility])    Pneumonia due to COVID-19 virus 04/02/2019   Sleep apnea    Noncompliant with CPAP   Stroke (HCC)    mini stroke   Type 2 diabetes mellitus (HCC)      Past Surgical History:  Procedure Laterality Date   ABDOMINAL AORTOGRAM  W/LOWER EXTREMITY N/A 04/30/2022   Procedure: ABDOMINAL AORTOGRAM W/LOWER EXTREMITY;  Surgeon: Carlene Che, MD;  Location: [facility];  Service: Cardiovascular;  Laterality: N/A;   ABDOMINAL AORTOGRAM W/LOWER EXTREMITY N/A 07/21/2022   Procedure: ABDOMINAL AORTOGRAM W/LOWER EXTREMITY;  Surgeon: Margherita Shell, MD;  Location: [facility];  Service: Cardiovascular;  Laterality: N/A;   ABDOMINAL HYSTERECTOMY     ACHILLES TENDON LENGTHENING  08/15/2022   Procedure: ACHILLES TENDON LENGTHENING;  Surgeon: Floyce Hutching, DPM;  Location: [facility] OR;  Service: Podiatry;;   AMPUTATION Right 05/29/2022   Procedure: RIGHT BELOW THE KNEE AMPUTATION;  Surgeon: Timothy Ford, MD;  Location: [facility] OR;  Service: Orthopedics;  Laterality: Right;   AMPUTATION Left 09/04/2022   Procedure: AMPUTATION FOOT, serial irrigation;  Surgeon: Jennefer Moats, DPM;  Location: [facility] OR;  Service: Podiatry;  Laterality: Left;  Surgical team to do block   AMPUTATION Left 10/07/2022   Procedure: LEFT BELOW KNEE AMPUTATION;  Surgeon: Timothy Ford, MD;  Location: [facility] OR;  Service: Orthopedics;  Laterality: Left;   AV FISTULA PLACEMENT Left 09/02/2017   Procedure: creation of left arm ARTERIOVENOUS (AV) FISTULA;  Surgeon: Margherita Shell, MD;  Location[facility] OR;  Service: Vascular;  Laterality: Left;   COLONOSCOPY  2008   Dr. Nolene Baumgarten: normal    COLONOSCOPY N/A 12/18/2016   Dr. Nolene Baumgarten: multiple tubular adenomas, internal hemorrhoids. Surveillance in 3 years    ESOPHAGEAL DILATION N/A 10/13/2015   Procedure: ESOPHAGEAL DILATION;  Surgeon: Ruby Corporal, MD;  [facility];  Service: Endoscopy;  Laterality: N/A;   ESOPHAGOGASTRODUODENOSCOPY N/A 10/13/2015   Dr. Homero Luster: chronic gastritis on path, no H.pylori. Empiric dilation    ESOPHAGOGASTRODUODENOSCOPY N/A 12/18/2016   Dr. Nolene Baumgarten: mild gastritis. BRAVO study revealed uncontrolled GERD. Dysphagia secondary to uncontrolled reflux   FOOT SURGERY Bilateral    "nerve"      LEFT HEART CATH AND CORONARY ANGIOGRAPHY N/A 12/29/2018   Procedure: LEFT HEART CATH AND CORONARY ANGIOGRAPHY;  Surgeon: Lucendia Rusk, MD;  Location: [facility] INVASIVE CV LAB;  Service: Cardiovascular;  Laterality: N/A;   LOWER EXTREMITY ANGIOGRAPHY Right 05/04/2022   Procedure: Lower Extremity Angiography;  Surgeon: Kayla Part, MD;  Location: [facility] INVASIVE CV LAB;  Service: Cardiovascular;  Laterality: Right;   LUNG BIOPSY     MASS EXCISION Right 01/09/2013   Procedure: EXCISION OF NEOPLASM OF RIGHT  AXILLA  AND EXCISION OF NEOPLASM OF LEFT AXILLA;  Surgeon: Beau Bound, MD;  Location: [facility] ORS;  Service: General;  Laterality: Right;  procedure end @ 08:23   MYRINGOTOMY WITH TUBE PLACEMENT Bilateral 04/28/2017   Procedure: BILATERAL MYRINGOTOMY WITH TUBE PLACEMENT;  Surgeon: Reynold Caves, MD;  Location: [facility] OR;  Service: ENT;  Laterality: Bilateral;   PERIPHERAL VASCULAR BALLOON ANGIOPLASTY Right 05/04/2022   Procedure: PERIPHERAL VASCULAR BALLOON ANGIOPLASTY;  Surgeon: Kayla Part, MD;  Location: [facility] INVASIVE CV LAB;  Service: Cardiovascular;  Laterality: Right;  PT   PERIPHERAL VASCULAR INTERVENTION Right 05/04/2022   Procedure:  PERIPHERAL VASCULAR INTERVENTION;  Surgeon: Kayla Part, MD;  Location: [facility] INVASIVE CV LAB;  Service: Cardiovascular;  Laterality: Right;  SFA   PERIPHERAL VASCULAR INTERVENTION Left 07/21/2022   Procedure: PERIPHERAL VASCULAR INTERVENTION;  Surgeon: Margherita Shell, MD;  [facility]: [facility];  Service: Cardiovascular;  Laterality: Left;   REVISION OF ARTERIOVENOUS GORETEX GRAFT Left 05/04/2018   Procedure: TRANSPOSITION OF CEPHALIC VEIN ARTERIOVENOUS FISTULA LEFT ARM;  Surgeon: Mayo Speck, MD;  Location: [facility] OR;  Service: Vascular;  Laterality: Left;   SAVORY DILATION N/A 12/18/2016   Procedure: SAVORY DILATION;  Surgeon: Alyce Jubilee, MD;  [facility];  Service: Endoscopy;  Laterality: N/A;   TRANSMETATARSAL AMPUTATION Left 08/15/2022    Procedure: TRANSMETATARSAL AMPUTATION;  Surgeon: Floyce Hutching, DPM;  Location: [facility] OR;  Service: Podiatry;  Laterality: Left;    Family History  Problem Relation Age of Onset   Hypertension Father    Hypercholesterolemia Father    Arthritis Father    Hypertension Sister    Hypercholesterolemia Sister    Breast cancer Sister    Hypertension Sister    Colon cancer Neg Hx    Colon polyps Neg Hx      Social Connections: Socially Integrated (05/03/2023)   Social Connection and Isolation Panel [NHANES]    Frequency of Communication with Friends and Family: More than three times a week    Frequency of Social Gatherings with Friends and Family: More than three times a week    Attends Religious Services: More than 4 times per year    Active Member of [employer] or Organizations: Yes    Attends Engineer, structural: More than 4 times per year    Marital Status: Married      Current Outpatient Medications:    amLODipine  (NORVASC ) 10 MG tablet, Take 1 tablet (10 mg total) by mouth daily., Disp: 90 tablet, Rfl: 1   aspirin  81 MG chewable tablet, Chew 2 tablets (162 mg total) by mouth daily., Disp: , Rfl:    B Complex-C-Folic Acid  (RENA-VITE RX) 1 MG TABS, Take 1 tablet by mouth daily., Disp: , Rfl:    benzonatate  (TESSALON ) 100 MG capsule, Take one cpsule once daily for cough and congestion, as meeded, Disp: 10 capsule, Rfl: 0   busPIRone  (BUSPAR ) 5 MG tablet, Take 1 tablet (5 mg total) by mouth 2 (two) times daily., Disp: 60 tablet, Rfl: 3   cinacalcet  (SENSIPAR ) 30 MG tablet, Take 3 tablets (90 mg total) by mouth 3 (three) times a week. (Patient taking differently: Take 90 mg by mouth 3 (three) times a week. At dialysis MWF), Disp: 60 tablet, Rfl:    clopidogrel  (PLAVIX ) 75 MG tablet, Take 1 tablet (75 mg total) by mouth daily., Disp: 30 tablet, Rfl: 11   DULoxetine  (CYMBALTA ) 60 MG capsule, TAKE (1) CAPSULE BY MOUTH ONCE DAILY., Disp: 28 capsule, Rfl: 11   ezetimibe  (ZETIA ) 10 MG  tablet, Take 1 tablet (10 mg total) by mouth daily., Disp: 28 tablet, Rfl: 11   FIASP  FLEXTOUCH 100 UNIT/ML FlexTouch Pen, INJECT 18 TO 30 UNITS INTO THE SKIN WITH BREAKFAST, WITH LUNCH, AND WITH EVENING MEAL (Patient taking differently: Inject 1-10 Units into the skin 3 (three) times daily. Sliding scale), Disp: 90 mL, Rfl: 3   gabapentin  (NEURONTIN ) 100 MG capsule, Take 1 capsule (100 mg total) by mouth 3 (three) times daily., Disp: 90 capsule, Rfl: 3   glucose blood (ONETOUCH VERIO) test strip, Use as instructed to check  blood sugar 4X daily., Disp: 200 each, Rfl: 2   hydrALAZINE  (APRESOLINE ) 25 MG tablet, Take 25 mg by mouth 2 (two) times daily., Disp: , Rfl:    insulin  glargine, 2 Unit Dial , (TOUJEO  MAX SOLOSTAR) 300 UNIT/ML Solostar Pen, Inject 30 Units into the skin daily. May need to slowly increase the dose depending upon your blood sugar, follow-up with PCP, Disp: 24 mL, Rfl: 1   metoprolol  succinate (TOPROL -XL) 50 MG 24 hr tablet, TAKE (1) TABLET BY MOUTH DAILY WITH FOOD *TAKE AFTER DIALYSIS*, Disp: 28 tablet, Rfl: 11   ofloxacin  (FLOXIN ) 0.3 % OTIC solution, PLACE 4 DROPS INTO BOTH EARS TWICE DAILY FOR 28 DAYS, Disp: , Rfl:    torsemide  (DEMADEX ) 100 MG tablet, Take 100 mg by mouth See admin instructions. Take in Tues,Thursday,Sat & Sun, Disp: , Rfl:    [name]  500 MG chewable tablet, Chew 1-2 tablets (500-1,000 mg total) by mouth See admin instructions. Take 1000mg  (2 tablets) by mouth with meals and 500mg  (1 tablet) with snacks, Disp: 90 tablet, Rfl: 0   doxycycline  (VIBRA -TABS) 100 MG tablet, Take 1 tablet (100 mg total) by mouth daily., Disp: 10 tablet, Rfl: 0   ofloxacin  (OCUFLOX ) 0.3 % ophthalmic solution, Place 1 drop into the right eye 4 (four) times daily., Disp: 5 mL, Rfl: 0   oxyCODONE -acetaminophen  (PERCOCET/ROXICET) 5-325 MG tablet, Take 1 tablet by mouth every 8 (eight) hours as needed., Disp: 20 tablet, Rfl: 0 No current facility-administered medications for this  visit.  Facility-Administered Medications Ordered in Other Visits:    0.9 %  sodium chloride  infusion, , Intravenous, Continuous, Lockamy, Randi L, NP-C, Stopped at 10/07/22 1422   Physical Exam:   BP (!) 154/77 (BP Location: Right Arm, Patient Position: Sitting, Cuff Size: Large)   Pulse 67   SpO2 94%   Salient findings:  CN II-XII intact Given history and complaints, ear microscopy was indicated and performed for evaluation with findings as below in physical exam section and in procedures; noted right ear cerumen impaction; after clearance, left EAC clear with some persistent debris right on TM so cannot visualize TM comprehensively; right EAC clear, again some wet debris which was cleared as much as tolerated with prior PE tube in ear canal and out of ear drum, which was removed. Thereafter, perforation noted but still with some cerumen. Weber 512: right Rinne 512: AC > BC b/l but inconsistent Anterior rhinoscopy: Septum relatively midline; bilateral inferior turbinates without significant hypertrophy No lesions of oral cavity No respiratory distress or stridor  Seprately Identifiable Procedures:  Procedure: Bilateral ear microscopy and cerumen removal using microscope (CPT [phone]) - Mod 25 Pre-procedure diagnosis: Cerumen impaction right external ears; conductive hearing loss Post-procedure diagnosis: same Indication: see above; given patient's otologic complaints and history as well as for improved and comprehensive examination of external ear and tympanic membrane, bilateral otologic examination using microscope was performed and impacted cerumen removed  Procedure: Verbal consent was obtained. Patient was placed semi-recumbent but not on exam chair given extremities. Both ear canals were examined using the microscope with findings above. Impacted Cerumen removed on right using suction and currette with improvement in EAC examination and patency. PE tube in canal also removed which had  been extruded. Patient tolerated the procedure well.      Impression & Plans:  Kathryn Beck is a 68 y.o. female with:  1. Mixed conductive and sensorineural hearing loss of right ear with restricted hearing of left ear   2. Dysfunction of both eustachian tubes  3. Sensation of fullness in both ears   4. Hx of tympanostomy tubes   5. Chronic diffuse otitis externa of both ears   6. Impacted cerumen of right ear    Still complaining of fullness and ETD sx (which she has a history of with bilateral tymp tubes last in 2019 by Dr. Darlin Ehrlich) for ETD. Query if left loss due to perforation? Still has some debris in the ear on top of TM so unable to visualize TM comprehensively; she is quite difficult to examine in clinic due to status of her legs. Will continue ofloxacin  BID x4 weeks (she cannot afford ciprodex  prescribed prior) Stressed dry ear and avoiding qtips F/u in 3 months - if persistent sx, consider exam and repeat tubes in OR  See below regarding exact medications prescribed this encounter including dosages and route: No orders of the defined types were placed in this encounter.     Thank you for allowing me the opportunity to care for your patient. Please do not hesitate to contact me should you have any other questions.  Sincerely, Milon Aloe, MD Otolaryngologist (ENT), [facility] Phone: [phone] Fax: [phone]  08/22/2023, 3:28 PM   I have personally spent 30 minutes involved in face-to-face and non-face-to-face activities for this patient on the day of the visit.  Professional time spent excludes any procedures performed but includes the following activities, in addition to those noted in the documentation: preparing to see the patient (review of outside documentation and results), performing a medically appropriate examination, counseling, documenting in the electronic health record

## 2023-08-04 DIAGNOSIS — Z992 Dependence on renal dialysis: Secondary | ICD-10-CM | POA: Diagnosis not present

## 2023-08-04 DIAGNOSIS — N186 End stage renal disease: Secondary | ICD-10-CM | POA: Diagnosis not present

## 2023-08-04 DIAGNOSIS — I959 Hypotension, unspecified: Secondary | ICD-10-CM | POA: Diagnosis not present

## 2023-08-04 DIAGNOSIS — I96 Gangrene, not elsewhere classified: Secondary | ICD-10-CM | POA: Diagnosis not present

## 2023-08-04 DIAGNOSIS — S88912A Complete traumatic amputation of left lower leg, level unspecified, initial encounter: Secondary | ICD-10-CM | POA: Diagnosis not present

## 2023-08-04 DIAGNOSIS — N2581 Secondary hyperparathyroidism of renal origin: Secondary | ICD-10-CM | POA: Diagnosis not present

## 2023-08-04 DIAGNOSIS — Z89511 Acquired absence of right leg below knee: Secondary | ICD-10-CM | POA: Diagnosis not present

## 2023-08-05 ENCOUNTER — Ambulatory Visit: Admitting: Orthopedic Surgery

## 2023-08-05 ENCOUNTER — Ambulatory Visit: Payer: Self-pay

## 2023-08-05 DIAGNOSIS — I96 Gangrene, not elsewhere classified: Secondary | ICD-10-CM

## 2023-08-05 MED ORDER — DOXYCYCLINE HYCLATE 100 MG PO TABS: 100.0000 mg | ORAL_TABLET | Freq: Every day | ORAL | 0 refills | Status: DC

## 2023-08-06 ENCOUNTER — Encounter: Payer: Self-pay | Admitting: Orthopedic Surgery

## 2023-08-06 DIAGNOSIS — Z992 Dependence on renal dialysis: Secondary | ICD-10-CM | POA: Diagnosis not present

## 2023-08-06 DIAGNOSIS — I959 Hypotension, unspecified: Secondary | ICD-10-CM | POA: Diagnosis not present

## 2023-08-06 DIAGNOSIS — N2581 Secondary hyperparathyroidism of renal origin: Secondary | ICD-10-CM | POA: Diagnosis not present

## 2023-08-06 DIAGNOSIS — N186 End stage renal disease: Secondary | ICD-10-CM | POA: Diagnosis not present

## 2023-08-06 NOTE — Progress Notes (Signed)
 Office Visit Note   Patient: Kathryn Beck           Date of Birth: 1955-07-10           MRN: [id number] Visit Date: 08/05/2023              Requested by: Towanda Fret, MD [address], [address] [place],  [place] [address] PCP: Towanda Fret, MD  Chief Complaint  Patient presents with   Right Hand - Wound Check   Left Hand - Wound Check      HPI: Patient is a 68 year old woman diabetic on dialysis with acute gangrene long finger both hands.  This was initially noticed in dialysis yesterday.  Patient states she has been on Bactrim  DS.  Patient complains of ischemic pain in both hands.  Patient states her dialysis is in her left upper extremity.  Assessment & Plan: Visit Diagnoses:  1. Gangrene of finger of both hands (HCC)     Plan: Prescription was provided for doxycycline  to take 1 a day.  Patient has ischemic changes in both hands.  Do not feel that moving her dialysis access would make much of a difference.  Follow-Up Instructions: Return in about 1 week (around 08/12/2023).   Ortho Exam  Patient is alert, oriented, no adenopathy, well-dressed, normal affect, normal respiratory effort. Examination patient has dry gangrene of the right long finger and dry gangrene with drainage from the left long finger.  She has a palpable radial pulse on the right.  On the left she has a monophasic radial and ulnar pulse.  Patient complains of ischemic pain in both upper extremities.  Imaging: No results found.   Labs: Lab Results  Component Value Date   HGBA1C 8.4 (A) 04/20/2023   HGBA1C 7.7 (A) 12/15/2022   HGBA1C 6.3 (H) 07/07/2022   ESRSEDRATE 70 (H) 09/03/2022   ESRSEDRATE 79 (H) 08/13/2022   ESRSEDRATE 37 (H) 04/25/2022   CRP 0.9 09/03/2022   CRP 9.1 (H) 08/13/2022   CRP 29.4 (H) 04/25/2022   LABURIC 1.8 (L) 06/07/2018   LABURIC 6.2 04/22/2018   LABURIC 9.3 (H) 10/07/2015   REPTSTATUS 09/09/2022 FINAL 09/04/2022   GRAMSTAIN  09/04/2022    RARE WBC  PRESENT, PREDOMINANTLY PMN NO ORGANISMS SEEN    CULT  09/04/2022    RARE ENTEROBACTER HORMAECHEI NO ANAEROBES ISOLATED Performed at [facility], [address], [place], [place] [address]    LABORGA ENTEROBACTER HORMAECHEI 09/04/2022     Lab Results  Component Value Date   ALBUMIN  3.1 (L) 12/28/2022   ALBUMIN  3.2 (L) 12/17/2022   ALBUMIN  3.8 (L) 12/15/2022   PREALBUMIN 26 08/13/2022   PREALBUMIN 24 04/25/2022    Lab Results  Component Value Date   MG 2.2 12/28/2022   MG 2.1 11/19/2022   MG 1.9 09/08/2022   Lab Results  Component Value Date   VD25OH 30.9 09/28/2017   VD25OH 15 (L) 10/07/2015   VD25OH 16 (L) 04/25/2015    Lab Results  Component Value Date   PREALBUMIN 26 08/13/2022   PREALBUMIN 24 04/25/2022      Latest Ref Rng & Units 05/02/2023    5:06 AM 04/30/2023    1:30 PM 12/28/2022    8:42 AM  CBC EXTENDED  WBC 4.0 - 10.5 K/uL 4.9  5.4  11.6   RBC 3.87 - 5.11 MIL/uL 4.49  4.36  4.52   Hemoglobin 12.0 - 15.0 g/dL 29.5  28.4  13.2  HCT 36.0 - 46.0 % 37.8  35.9  36.2   Platelets 150 - 400 K/uL 264  162  262   NEUT# 1.7 - 7.7 K/uL 3.9  3.7  10.2   Lymph# 0.7 - 4.0 K/uL 0.7  1.2  0.8      There is no height or weight on file to calculate BMI.  Orders:  No orders of the defined types were placed in this encounter.  Meds ordered this encounter  Medications   doxycycline  (VIBRA -TABS) 100 MG tablet    Sig: Take 1 tablet (100 mg total) by mouth daily.    Dispense:  10 tablet    Refill:  0     Procedures: No procedures performed  Clinical Data: No additional findings.  ROS:  All other systems negative, except as noted in the HPI. Review of Systems  Objective: Vital Signs: There were no vitals taken for this visit.  Specialty Comments:  No specialty comments available.  PMFS History: Patient Active Problem List   Diagnosis Date Noted   Diarrhea 05/04/2023   RSV (acute bronchiolitis due to respiratory syncytial virus) 04/30/2023    Hearing loss 04/27/2023   Otalgia of both ears 04/27/2023   Volume overload 12/28/2022   Fall at home 12/18/2022   CVA (cerebral vascular accident) (HCC) 12/17/2022   Acute confusion 12/10/2022   Poor appetite 12/10/2022   Chills 12/10/2022   Generalized weakness 11/18/2022   Paronychia of finger of right hand 11/13/2022   Dehiscence of amputation stump of left lower extremity (HCC) 10/07/2022   Status post below-knee amputation of left lower extremity (HCC) 10/07/2022   Subacute osteomyelitis, left ankle and foot (HCC) 09/03/2022   Left foot infection 09/03/2022   Amputated left leg (HCC) 09/03/2022   Wound infection after surgery 09/03/2022   Equinus contracture of left ankle 08/15/2022   Acute osteomyelitis of toe of left foot (HCC) 08/13/2022   Peripheral vascular complication 07/21/2022   PVD (peripheral vascular disease) (HCC) 07/18/2022   Toe ulcer, left, limited to breakdown of skin (HCC) 07/13/2022   S/P BKA (below knee amputation), right (HCC) 05/29/2022   Confusion 05/19/2022   Visual hallucinations 05/19/2022   Watery eyes 05/19/2022   Requires assistance with activities of daily living (ADL) 04/26/2022   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   Gangrene of right foot (HCC) 04/25/2022   PAD (peripheral artery disease) (HCC) 04/25/2022   Other osteoporosis without current pathological fracture 11/10/2021   Hypocalcemia 11/10/2021   Fall 11/02/2021   Left foot pain 10/31/2021   Low back pain with left-sided sciatica 10/31/2021   Left ear impacted cerumen 09/18/2021   Chronic left shoulder pain 06/10/2021   Abnormal CXR 03/10/2021   Ear pain, bilateral 03/10/2021   Morbid obesity (HCC) 03/10/2021   Subungual hematoma of second toe of left foot 10/28/2020   Insomnia 10/28/2020   Memory loss or impairment 09/26/2020   Forgetfulness 09/26/2020   Recurrent vertigo 09/26/2020   Abnormal MRI, cervical spine 03/04/2020   Right leg weakness 02/28/2020   Knee  pain, right 10/23/2019   Cough 08/09/2019   Carpal tunnel syndrome 06/13/2019   Chronic pain syndrome 06/13/2019   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) 06/13/2019   Encounter for support and coordination of transition of care 04/14/2019   Neck pain 03/23/2019   Near syncope 12/05/2018   ESRD on hemodialysis (HCC) 11/22/2018   Ischemic heart disease 09/25/2018   Not currently working due to disabled status 06/13/2018  HTN (hypertension) 06/12/2018   Depression 03/09/2018   Adrenal mass, left (HCC) 11/09/2017   MGUS (monoclonal gammopathy of unknown significance) 11/05/2017   Shoulder pain 11/01/2017   Chronic diastolic (congestive) heart failure (HCC) 09/20/2017   Bilateral leg weakness 09/09/2017   Adrenal adenoma 09/03/2017   Uncontrolled type 2 diabetes mellitus 08/18/2017   Unsteady gait 07/11/2017   Recurrent falls 07/11/2017   Anemia of chronic disease 03/24/2017   History of colonic polyps 02/10/2017   Dysphagia 11/05/2016   Irritable bowel syndrome 02/04/2016   Hospital discharge follow-up 10/27/2015   Intolerant of heat 10/07/2014   Cardiac murmur 01/17/2014   Back pain with left-sided radiculopathy 09/18/2013   Seasonal allergies 03/10/2013   Lipoma of back 12/22/2012   Other seasonal allergic rhinitis 08/22/2012   Bronchitis 12/01/2011   OSA (obstructive sleep apnea) 06/02/2011   Chronic kidney disease, stage 4 (severe) (HCC) 01/13/2011   Neuropathy 04/16/2010   Malaise and fatigue 07/24/2009   Personal history of other diseases of the nervous system and sense organs 05/08/2009   LUPUS ERYTHEMATOSUS, DISCOID 06/05/2008   Arm pain 05/30/2008   Hyperlipidemia 06/07/2007   Obesity 06/07/2007   Malignant hypertension 06/07/2007   Type 2 diabetes mellitus with hyperglycemia (HCC) 06/07/2007   Diabetes mellitus (HCC) 06/07/2007   Past Medical History:  Diagnosis Date   Acid reflux    Amputated left leg (HCC)    Anemia    Arthritis     Axillary masses    Soft tissue - status post excision   Back pain    Cancer (HCC)    CHF (congestive heart failure) (HCC)    COVID-19 virus infection 04/06/2019   Depression    End-stage renal disease (HCC)    M/W/F dialysis   Essential hypertension    Headache    years ago   History of blood transfusion    History of cardiac catheterization    Normal coronary arteries October 2020   History of claustrophobia    History of pneumonia 2019   Hypoxia 04/03/2019   Memory loss    Mixed hyperlipidemia    Obesity    Pancreatitis    Peritoneal dialysis catheter in place [facility])    Pneumonia due to COVID-19 virus 04/02/2019   Sleep apnea    Noncompliant with CPAP   Stroke (HCC)    mini stroke   Type 2 diabetes mellitus (HCC)     Family History  Problem Relation Age of Onset   Hypertension Father    Hypercholesterolemia Father    Arthritis Father    Hypertension Sister    Hypercholesterolemia Sister    Breast cancer Sister    Hypertension Sister    Colon cancer Neg Hx    Colon polyps Neg Hx     Past Surgical History:  Procedure Laterality Date   ABDOMINAL AORTOGRAM W/LOWER EXTREMITY N/A 04/30/2022   Procedure: ABDOMINAL AORTOGRAM W/LOWER EXTREMITY;  Surgeon: Carlene Che, MD;  Location: [facility];  Service: Cardiovascular;  Laterality: N/A;   ABDOMINAL AORTOGRAM W/LOWER EXTREMITY N/A 07/21/2022   Procedure: ABDOMINAL AORTOGRAM W/LOWER EXTREMITY;  Surgeon: Margherita Shell, MD;  Location: [facility];  Service: Cardiovascular;  Laterality: N/A;   ABDOMINAL HYSTERECTOMY     ACHILLES TENDON LENGTHENING  08/15/2022   Procedure: ACHILLES TENDON LENGTHENING;  Surgeon: Floyce Hutching, DPM;  Location: [facility] OR;  Service: Podiatry;;   AMPUTATION Right 05/29/2022   Procedure: RIGHT BELOW THE KNEE AMPUTATION;  Surgeon: Timothy Ford, MD;  Location: [facility] OR;  Service: Orthopedics;  Laterality: Right;   AMPUTATION Left 09/04/2022   Procedure: AMPUTATION FOOT, serial  irrigation;  Surgeon: Jennefer Moats, DPM;  Location: [facility] OR;  Service: Podiatry;  Laterality: Left;  Surgical team to do block   AMPUTATION Left 10/07/2022   Procedure: LEFT BELOW KNEE AMPUTATION;  Surgeon: Timothy Ford, MD;  Location: [facility] OR;  Service: Orthopedics;  Laterality: Left;   AV FISTULA PLACEMENT Left 09/02/2017   Procedure: creation of left arm ARTERIOVENOUS (AV) FISTULA;  Surgeon: Margherita Shell, MD;  Location: [facility] OR;  Service: Vascular;  Laterality: Left;   COLONOSCOPY  2008   Dr. Nolene Baumgarten: normal    COLONOSCOPY N/A 12/18/2016   Dr. Nolene Baumgarten: multiple tubular adenomas, internal hemorrhoids. Surveillance in 3 years    ESOPHAGEAL DILATION N/A 10/13/2015   Procedure: ESOPHAGEAL DILATION;  Surgeon: Ruby Corporal, MD;  Location: [facility];  Service: Endoscopy;  Laterality: N/A;   ESOPHAGOGASTRODUODENOSCOPY N/A 10/13/2015   Dr. Homero Luster: chronic gastritis on path, no H.pylori. Empiric dilation    ESOPHAGOGASTRODUODENOSCOPY N/A 12/18/2016   Dr. Nolene Baumgarten: mild gastritis. BRAVO study revealed uncontrolled GERD. Dysphagia secondary to uncontrolled reflux   FOOT SURGERY Bilateral    "nerve"     LEFT HEART CATH AND CORONARY ANGIOGRAPHY N/A 12/29/2018   Procedure: LEFT HEART CATH AND CORONARY ANGIOGRAPHY;  Surgeon: Lucendia Rusk, MD;  Location: [facility] INVASIVE CV LAB;  Service: Cardiovascular;  Laterality: N/A;   LOWER EXTREMITY ANGIOGRAPHY Right 05/04/2022   Procedure: Lower Extremity Angiography;  Surgeon: Kayla Part, MD;  Location: [facility] INVASIVE CV LAB;  Service: Cardiovascular;  Laterality: Right;   LUNG BIOPSY     MASS EXCISION Right 01/09/2013   Procedure: EXCISION OF NEOPLASM OF RIGHT  AXILLA  AND EXCISION OF NEOPLASM OF LEFT AXILLA;  Surgeon: Beau Bound, MD;  Location: [facility] ORS;  Service: General;  Laterality: Right;  procedure end @ 08:23   MYRINGOTOMY WITH TUBE PLACEMENT Bilateral 04/28/2017   Procedure: BILATERAL MYRINGOTOMY WITH TUBE PLACEMENT;  Surgeon: Reynold Caves, MD;   Location: [facility] OR;  Service: ENT;  Laterality: Bilateral;   PERIPHERAL VASCULAR BALLOON ANGIOPLASTY Right 05/04/2022   Procedure: PERIPHERAL VASCULAR BALLOON ANGIOPLASTY;  Surgeon: Kayla Part, MD;  Location: [facility] INVASIVE CV LAB;  Service: Cardiovascular;  Laterality: Right;  PT   PERIPHERAL VASCULAR INTERVENTION Right 05/04/2022   Procedure: PERIPHERAL VASCULAR INTERVENTION;  Surgeon: Kayla Part, MD;  Location: [facility] INVASIVE CV LAB;  Service: Cardiovascular;  Laterality: Right;  SFA   PERIPHERAL VASCULAR INTERVENTION Left 07/21/2022   Procedure: PERIPHERAL VASCULAR INTERVENTION;  Surgeon: Margherita Shell, MD;  Location: [facility];  Service: Cardiovascular;  Laterality: Left;   REVISION OF ARTERIOVENOUS GORETEX GRAFT Left 05/04/2018   Procedure: TRANSPOSITION OF CEPHALIC VEIN ARTERIOVENOUS FISTULA LEFT ARM;  Surgeon: Mayo Speck, MD;  Location: [facility] OR;  Service: Vascular;  Laterality: Left;   SAVORY DILATION N/A 12/18/2016   Procedure: SAVORY DILATION;  Surgeon: Alyce Jubilee, MD;  Location[facility];  Service: Endoscopy;  Laterality: N/A;   TRANSMETATARSAL AMPUTATION Left 08/15/2022   Procedure: TRANSMETATARSAL AMPUTATION;  Surgeon: Floyce Hutching, DPM;  Location: [facility] OR;  Service: Podiatry;  Laterality: Left;   Social History   Occupational History   Occupation: retired   Tobacco Use   Smoking status: Never    Passive exposure: Never   Smokeless tobacco: Never   Tobacco comments:    Verified by Daughter, [name]  Vaping Use  Vaping status: Never Used  Substance and Sexual Activity   Alcohol use: No   Drug use: No   Sexual activity: Not Currently    Partners: Male

## 2023-08-09 ENCOUNTER — Encounter: Payer: Self-pay | Admitting: Audiology

## 2023-08-09 ENCOUNTER — Telehealth: Payer: Self-pay

## 2023-08-09 DIAGNOSIS — N186 End stage renal disease: Secondary | ICD-10-CM | POA: Diagnosis not present

## 2023-08-09 DIAGNOSIS — N2581 Secondary hyperparathyroidism of renal origin: Secondary | ICD-10-CM | POA: Diagnosis not present

## 2023-08-09 DIAGNOSIS — I959 Hypotension, unspecified: Secondary | ICD-10-CM | POA: Diagnosis not present

## 2023-08-09 DIAGNOSIS — Z992 Dependence on renal dialysis: Secondary | ICD-10-CM | POA: Diagnosis not present

## 2023-08-09 NOTE — Telephone Encounter (Signed)
 Copied from CRM [id number]. Topic: General - Other >> Aug 09, 2023  2:16 PM Santiya F wrote: Reason for CRM: Caretha Chapel with [facility] is calling in to check the status of a requisition form that was sent over on 08/05/23. He wants to know if it has been completed. Caretha Chapel is requesting a call back at his direct line [phone]

## 2023-08-10 ENCOUNTER — Ambulatory Visit: Payer: Medicare PPO | Admitting: Neurology

## 2023-08-10 NOTE — Telephone Encounter (Signed)
 We would need the patient's consent before completing any forms

## 2023-08-11 ENCOUNTER — Encounter (HOSPITAL_COMMUNITY): Payer: Self-pay

## 2023-08-11 ENCOUNTER — Telehealth: Payer: Self-pay | Admitting: Family Medicine

## 2023-08-11 ENCOUNTER — Emergency Department (HOSPITAL_COMMUNITY)
Admission: EM | Admit: 2023-08-11 | Discharge: 2023-08-11 | Disposition: A | Discharge status: Home / Self Care | Attending: Emergency Medicine | Admitting: Emergency Medicine

## 2023-08-11 ENCOUNTER — Other Ambulatory Visit: Payer: Self-pay

## 2023-08-11 DIAGNOSIS — H1131 Conjunctival hemorrhage, right eye: Secondary | ICD-10-CM | POA: Insufficient documentation

## 2023-08-11 DIAGNOSIS — Z992 Dependence on renal dialysis: Secondary | ICD-10-CM | POA: Diagnosis not present

## 2023-08-11 DIAGNOSIS — N186 End stage renal disease: Secondary | ICD-10-CM | POA: Insufficient documentation

## 2023-08-11 DIAGNOSIS — I959 Hypotension, unspecified: Secondary | ICD-10-CM | POA: Diagnosis not present

## 2023-08-11 DIAGNOSIS — Z7901 Long term (current) use of anticoagulants: Secondary | ICD-10-CM | POA: Diagnosis not present

## 2023-08-11 DIAGNOSIS — Z7982 Long term (current) use of aspirin: Secondary | ICD-10-CM | POA: Insufficient documentation

## 2023-08-11 DIAGNOSIS — N2581 Secondary hyperparathyroidism of renal origin: Secondary | ICD-10-CM | POA: Diagnosis not present

## 2023-08-11 DIAGNOSIS — H5789 Other specified disorders of eye and adnexa: Secondary | ICD-10-CM | POA: Diagnosis present

## 2023-08-11 MED ORDER — OFLOXACIN 0.3 % OP SOLN: 1.0000 [drp] | Freq: Four times a day (QID) | OPHTHALMIC | 0 refills | Status: DC

## 2023-08-11 NOTE — ED Provider Notes (Signed)
 [place] [facility] DE[facility]MENT AT [facility] Provider Note   CSN: [id number] Arrival date & time: 08/11/23  1125     History  Chief Complaint  Patient presents with   Eye Problem    Kathryn Beck is a 68 y.o. female.   Eye Problem  This patient is a 68 year old female, she has a history of end-stage renal disease on dialysis, she was at dialysis today when she was noted to have some redness of her right eye, she was sent to the ER because of this, she has no knowledge of this until she was told that it was like that, there is no discomfort or itching or abrasions or pain in the eye, no changes in her vision.  She does not wear contact lenses, she denies coughing sneezing or injury to the eye    Home Medications Prior to Admission medications   Medication Sig Start Date End Date Taking? Authorizing Provider  ofloxacin  (OCUFLOX ) 0.3 % ophthalmic solution Place 1 drop into the right eye 4 (four) times daily. 08/11/23  Yes Early Glisson, MD  amLODipine  (NORVASC ) 10 MG tablet Take 1 tablet (10 mg total) by mouth daily. 04/08/23   Clearnce Curia, NP  aspirin  81 MG chewable tablet Chew 2 tablets (162 mg total) by mouth daily. 12/19/22   Johnson, Clanford L, MD  B Complex-C-Folic Acid  (RENA-VITE RX) 1 MG TABS Take 1 tablet by mouth daily. 04/15/22   [provider]  benzonatate  (TESSALON ) 100 MG capsule Take one cpsule once daily for cough and congestion, as meeded 04/22/23   Towanda Fret, MD  busPIRone  (BUSPAR ) 5 MG tablet Take 1 tablet (5 mg total) by mouth 2 (two) times daily. 06/07/23   Towanda Fret, MD  cinacalcet  (SENSIPAR ) 30 MG tablet Take 3 tablets (90 mg total) by mouth 3 (three) times a week. Patient taking differently: Take 90 mg by mouth 3 (three) times a week. At dialysis MWF 09/09/22   Aura Leeds Latif, DO  clopidogrel  (PLAVIX ) 75 MG tablet Take 1 tablet (75 mg total) by mouth daily. 12/22/22   Camara, Amadou, MD  doxycycline  (VIBRA -TABS)  100 MG tablet Take 1 tablet (100 mg total) by mouth daily. 08/05/23   Timothy Ford, MD  DULoxetine  (CYMBALTA ) 60 MG capsule TAKE (1) CAPSULE BY MOUTH ONCE DAILY. 04/05/23   Towanda Fret, MD  ezetimibe  (ZETIA ) 10 MG tablet Take 1 tablet (10 mg total) by mouth daily. 04/27/23   Towanda Fret, MD  FIASP  FLEXTOUCH 100 UNIT/ML FlexTouch Pen INJECT 18 TO 30 UNITS INTO THE SKIN WITH BREAKFAST, WITH LUNCH, AND WITH EVENING MEAL Patient taking differently: Inject 1-10 Units into the skin 3 (three) times daily. Sliding scale 10/20/22   Emilie Harden, MD  gabapentin  (NEURONTIN ) 100 MG capsule Take 1 capsule (100 mg total) by mouth 3 (three) times daily. 07/07/23 08/06/23  Camara, Amadou, MD  glucose blood (ONETOUCH VERIO) test strip Use as instructed to check blood sugar 4X daily. 04/16/22   Emilie Harden, MD  hydrALAZINE  (APRESOLINE ) 25 MG tablet Take 25 mg by mouth 2 (two) times daily. 04/27/23   [provider]  insulin  glargine, 2 Unit Dial , (TOUJEO  MAX SOLOSTAR) 300 UNIT/ML Solostar Pen Inject 30 Units into the skin daily. May need to slowly increase the dose depending upon your blood sugar, follow-up with PCP 01/01/23   Emilie Harden, MD  metoprolol  succinate (TOPROL -XL) 50 MG 24 hr tablet TAKE (1) TABLET BY MOUTH DAILY WITH FOOD *TAKE  AFTER DIALYSIS* 01/05/23   Towanda Fret, MD  ofloxacin  (FLOXIN ) 0.3 % OTIC solution PLACE 4 DROPS INTO BOTH EARS TWICE DAILY FOR 28 DAYS 07/22/23   [provider]  torsemide  (DEMADEX ) 100 MG tablet Take 100 mg by mouth See admin instructions. Take in Tues,Thursday,Sat & Sun 12/01/22   [provider]  [name]  500 MG chewable tablet Chew 1-2 tablets (500-1,000 mg total) by mouth See admin instructions. Take 1000mg  (2 tablets) by mouth with meals and 500mg  (1 tablet) with snacks 12/24/22   Towanda Fret, MD  FLUoxetine (PROZAC) 10 MG capsule Take 10 mg by mouth daily.    05/28/11  [provider]  glipiZIDE   (GLUCOTROL ) 10 MG tablet Take 10 mg by mouth 2 (two) times daily before a meal.    05/28/11  [provider]      Allergies    Ace inhibitors, Penicillins, Statins, and Albuterol     Review of Systems   Review of Systems  All other systems reviewed and are negative.   Physical Exam Updated Vital Signs BP (!) 163/63 (BP Location: Right Arm)   Pulse 77   Temp (!) 97.4 F (36.3 C) (Temporal)   Resp 16   Ht 1.575 m (5\' 2" )   Wt 76.2 kg   SpO2 99%   BMI 30.73 kg/m  Physical Exam Vitals and nursing note reviewed.  Constitutional:      Appearance: She is well-developed. She is not diaphoretic.  HENT:     Head: Normocephalic and atraumatic.  Eyes:     General:        Right eye: No discharge.        Left eye: No discharge.     Comments: Subconjunctival hemorrhage on the right lateral to the pupil  Pulmonary:     Effort: Pulmonary effort is normal. No respiratory distress.  Skin:    General: Skin is warm and dry.     Findings: No erythema or rash.  Neurological:     Mental Status: She is alert.     Coordination: Coordination normal.     ED Results / Procedures / Treatments   Labs (all labs ordered are listed, but only abnormal results are displayed) Labs Reviewed - No data to display  EKG None  Radiology No results found.  Procedures Procedures    Medications Ordered in ED Medications - No data to display  ED Course/ Medical Decision Making/ A&P                                 Medical Decision Making Risk Prescription drug management.   Exam is very reassuring, there is no visual changes, no signs of injury, there is an isolated subconjunctival hemorrhage on the right.  I have reviewed the patient's home medications, it appears that she does take Plavix , this will likely get better over the week, patient informed, stable for discharge        Final Clinical Impression(s) / ED Diagnoses Final diagnoses:  Subconjunctival hemorrhage of right  eye    Rx / DC Orders ED Discharge Orders          Ordered    ofloxacin  (OCUFLOX ) 0.3 % ophthalmic solution  4 times daily        08/11/23 1305              Early Glisson, MD 08/11/23 1306

## 2023-08-11 NOTE — Discharge Instructions (Addendum)
 Your examination is consistent with a small bruise to the eye, there is nothing that you need to specifically do for this, read the attached instructions  This should gradually clear up over the next week  See your eye doctor in 1 week if no improvement, if you are developing worsening pain swelling changes in vision or any other worsening symptoms return to the ER

## 2023-08-11 NOTE — ED Triage Notes (Signed)
 Pt arrived via POV following her dialysis appointment this morning, where Pt reports a nurse noticed her right eye looked different. Pt presents with redness and swelling in her right eye that Pt reports began this morning and was not present yesterday.

## 2023-08-12 ENCOUNTER — Encounter: Payer: Self-pay | Admitting: Orthopedic Surgery

## 2023-08-12 ENCOUNTER — Ambulatory Visit (INDEPENDENT_AMBULATORY_CARE_PROVIDER_SITE_OTHER): Admitting: Orthopedic Surgery

## 2023-08-12 DIAGNOSIS — I96 Gangrene, not elsewhere classified: Secondary | ICD-10-CM | POA: Diagnosis not present

## 2023-08-12 MED ORDER — OXYCODONE-ACETAMINOPHEN 5-325 MG PO TABS: 1.0000 | ORAL_TABLET | Freq: Three times a day (TID) | ORAL | 0 refills | Status: DC | PRN

## 2023-08-12 NOTE — Progress Notes (Signed)
 Office Visit Note   Patient: Kathryn Beck           Date of Birth: 05/06/1955           MRN: [id number] Visit Date: 08/12/2023              Requested by: Towanda Fret, MD [address] [place],  [place] [address] PCP: Towanda Fret, MD  Chief Complaint  Patient presents with   Right Hand - Wound Check   Left Hand - Wound Check      HPI: Patient is a 68 year old woman who presents in follow-up for gangrenous ulceration tip of the long finger bilaterally.  She initially had purulent drainage from the left long finger.  She has completed her antibiotics.  Assessment & Plan: Visit Diagnoses:  1. 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  She may continue using her antibiotic ointment.  Follow-up if symptoms worsen.  Follow-Up Instructions: Return if symptoms worsen or fail to improve.   Ortho Exam  Patient is alert, oriented, no adenopathy, well-dressed, normal affect, normal respiratory effort. Examination patient is on dialysis with dialysis in the left upper extremity.  The purulent draining ulcer from the left long finger has resolved there is eschar over the tip of the finger but no cellulitis no drainage the nail was removed without complication no signs of any deep infection.  Examination of the right long finger the gangrenous eschar over the tip of the right long finger is stable without signs of swelling cellulitis or drainage.  Imaging: No results found. No images are attached to the encounter.  Labs: Lab Results  Component Value Date   HGBA1C 8.4 (A) 04/20/2023   HGBA1C 7.7 (A) 12/15/2022   HGBA1C 6.3 (H) 07/07/2022   ESRSEDRATE 70 (H) 09/03/2022   ESRSEDRATE 79 (H) 08/13/2022   ESRSEDRATE 37 (H) 04/25/2022   CRP 0.9 09/03/2022   CRP 9.1 (H) 08/13/2022   CRP 29.4 (H) 04/25/2022   LABURIC 1.8 (L) 06/07/2018   LABURIC 6.2 04/22/2018   LABURIC 9.3 (H) 10/07/2015   REPTSTATUS  09/09/2022 FINAL 09/04/2022   GRAMSTAIN  09/04/2022    RARE WBC PRESENT, PREDOMINANTLY PMN NO ORGANISMS SEEN    CULT  09/04/2022    RARE ENTEROBACTER HORMAECHEI NO ANAEROBES ISOLATED Performed at [facility], [address], [place], [place] [address]    LABORGA ENTEROBACTER HORMAECHEI 09/04/2022     Lab Results  Component Value Date   ALBUMIN  3.1 (L) 12/28/2022   ALBUMIN  3.2 (L) 12/17/2022   ALBUMIN  3.8 (L) 12/15/2022   PREALBUMIN 26 08/13/2022   PREALBUMIN 24 04/25/2022    Lab Results  Component Value Date   MG 2.2 12/28/2022   MG 2.1 11/19/2022   MG 1.9 09/08/2022   Lab Results  Component Value Date   VD25OH 30.9 09/28/2017   VD25OH 15 (L) 10/07/2015   VD25OH 16 (L) 04/25/2015    Lab Results  Component Value Date   PREALBUMIN 26 08/13/2022   PREALBUMIN 24 04/25/2022      Latest Ref Rng & Units 05/02/2023    5:06 AM 04/30/2023    1:30 PM 12/28/2022    8:42 AM  CBC EXTENDED  WBC 4.0 - 10.5 K/uL 4.9  5.4  11.6   RBC 3.87 - 5.11 MIL/uL 4.49  4.36  4.52   Hemoglobin 12.0 - 15.0 g/dL 82.9  56.2  10.8   HCT 36.0 - 46.0 % 37.8  35.9  36.2   Platelets 150 - 400 K/uL 264  162  262   NEUT# 1.7 - 7.7 K/uL 3.9  3.7  10.2   Lymph# 0.7 - 4.0 K/uL 0.7  1.2  0.8      There is no height or weight on file to calculate BMI.  Orders:  No orders of the defined types were placed in this encounter.  Meds ordered this encounter  Medications   oxyCODONE -acetaminophen  (PERCOCET/ROXICET) 5-325 MG tablet    Sig: Take 1 tablet by mouth every 8 (eight) hours as needed.    Dispense:  20 tablet    Refill:  0     Procedures: No procedures performed  Clinical Data: No additional findings.  ROS:  All other systems negative, except as noted in the HPI. Review of Systems  Objective: Vital Signs: There were no vitals taken for this visit.  Specialty Comments:  No specialty comments available.  PMFS History: Patient Active Problem List   Diagnosis Date Noted    Diarrhea 05/04/2023   RSV (acute bronchiolitis due to respiratory syncytial virus) 04/30/2023   Hearing loss 04/27/2023   Otalgia of both ears 04/27/2023   Volume overload 12/28/2022   Fall at home 12/18/2022   CVA (cerebral vascular accident) (HCC) 12/17/2022   Acute confusion 12/10/2022   Poor appetite 12/10/2022   Chills 12/10/2022   Generalized weakness 11/18/2022   Paronychia of finger of right hand 11/13/2022   Dehiscence of amputation stump of left lower extremity (HCC) 10/07/2022   Status post below-knee amputation of left lower extremity (HCC) 10/07/2022   Subacute osteomyelitis, left ankle and foot (HCC) 09/03/2022   Left foot infection 09/03/2022   Amputated left leg (HCC) 09/03/2022   Wound infection after surgery 09/03/2022   Equinus contracture of left ankle 08/15/2022   Acute osteomyelitis of toe of left foot (HCC) 08/13/2022   Peripheral vascular complication 07/21/2022   PVD (peripheral vascular disease) (HCC) 07/18/2022   Toe ulcer, left, limited to breakdown of skin (HCC) 07/13/2022   S/P BKA (below knee amputation), right (HCC) 05/29/2022   Confusion 05/19/2022   Visual hallucinations 05/19/2022   Watery eyes 05/19/2022   Requires assistance with activities of daily living (ADL) 04/26/2022   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   Gangrene of right foot (HCC) 04/25/2022   PAD (peripheral artery disease) (HCC) 04/25/2022   Other osteoporosis without current pathological fracture 11/10/2021   Hypocalcemia 11/10/2021   Fall 11/02/2021   Left foot pain 10/31/2021   Low back pain with left-sided sciatica 10/31/2021   Left ear impacted cerumen 09/18/2021   Chronic left shoulder pain 06/10/2021   Abnormal CXR 03/10/2021   Ear pain, bilateral 03/10/2021   Morbid obesity (HCC) 03/10/2021   Subungual hematoma of second toe of left foot 10/28/2020   Insomnia 10/28/2020   Memory loss or impairment 09/26/2020   Forgetfulness 09/26/2020   Recurrent  vertigo 09/26/2020   Abnormal MRI, cervical spine 03/04/2020   Right leg weakness 02/28/2020   Knee pain, right 10/23/2019   Cough 08/09/2019   Carpal tunnel syndrome 06/13/2019   Chronic pain syndrome 06/13/2019   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) 06/13/2019   Encounter for support and coordination of transition of care 04/14/2019   Neck pain 03/23/2019   Near syncope 12/05/2018   ESRD on hemodialysis (HCC) 11/22/2018   Ischemic heart disease 09/25/2018   Not currently working  due to disabled status 06/13/2018   HTN (hypertension) 06/12/2018   Depression 03/09/2018   Adrenal mass, left (HCC) 11/09/2017   MGUS (monoclonal gammopathy of unknown significance) 11/05/2017   Shoulder pain 11/01/2017   Chronic diastolic (congestive) heart failure (HCC) 09/20/2017   Bilateral leg weakness 09/09/2017   Adrenal adenoma 09/03/2017   Uncontrolled type 2 diabetes mellitus 08/18/2017   Unsteady gait 07/11/2017   Recurrent falls 07/11/2017   Anemia of chronic disease 03/24/2017   History of colonic polyps 02/10/2017   Dysphagia 11/05/2016   Irritable bowel syndrome 02/04/2016   Hospital discharge follow-up 10/27/2015   Intolerant of heat 10/07/2014   Cardiac murmur 01/17/2014   Back pain with left-sided radiculopathy 09/18/2013   Seasonal allergies 03/10/2013   Lipoma of back 12/22/2012   Other seasonal allergic rhinitis 08/22/2012   Bronchitis 12/01/2011   OSA (obstructive sleep apnea) 06/02/2011   Chronic kidney disease, stage 4 (severe) (HCC) 01/13/2011   Neuropathy 04/16/2010   Malaise and fatigue 07/24/2009   Personal history of other diseases of the nervous system and sense organs 05/08/2009   LUPUS ERYTHEMATOSUS, DISCOID 06/05/2008   Arm pain 05/30/2008   Hyperlipidemia 06/07/2007   Obesity 06/07/2007   Malignant hypertension 06/07/2007   Type 2 diabetes mellitus with hyperglycemia (HCC) 06/07/2007   Diabetes mellitus (HCC) 06/07/2007   Past  Medical History:  Diagnosis Date   Acid reflux    Amputated left leg (HCC)    Anemia    Arthritis    Axillary masses    Soft tissue - status post excision   Back pain    Cancer (HCC)    CHF (congestive heart failure) (HCC)    COVID-19 virus infection 04/06/2019   Depression    End-stage renal disease (HCC)    M/W/F dialysis   Essential hypertension    Headache    years ago   History of blood transfusion    History of cardiac catheterization    Normal coronary arteries October 2020   History of claustrophobia    History of pneumonia 2019   Hypoxia 04/03/2019   Memory loss    Mixed hyperlipidemia    Obesity    Pancreatitis    Peritoneal dialysis catheter in place [facility])    Pneumonia due to COVID-19 virus 04/02/2019   Sleep apnea    Noncompliant with CPAP   Stroke (HCC)    mini stroke   Type 2 diabetes mellitus (HCC)     Family History  Problem Relation Age of Onset   Hypertension Father    Hypercholesterolemia Father    Arthritis Father    Hypertension Sister    Hypercholesterolemia Sister    Breast cancer Sister    Hypertension Sister    Colon cancer Neg Hx    Colon polyps Neg Hx     Past Surgical History:  Procedure Laterality Date   ABDOMINAL AORTOGRAM W/LOWER EXTREMITY N/A 04/30/2022   Procedure: ABDOMINAL AORTOGRAM W/LOWER EXTREMITY;  Surgeon: Carlene Che, MD;  Location: [facility] LAB;  Service: Cardiovascular;  Laterality: N/A;   ABDOMINAL AORTOGRAM W/LOWER EXTREMITY N/A 07/21/2022   Procedure: ABDOMINAL AORTOGRAM W/LOWER EXTREMITY;  Surgeon: Margherita Shell, MD;  Location: [facility];  Service: Cardiovascular;  Laterality: N/A;   ABDOMINAL HYSTERECTOMY     ACHILLES TENDON LENGTHENING  08/15/2022   Procedure: ACHILLES TENDON LENGTHENING;  Surgeon: Floyce Hutching, DPM;  Location: [facility] OR;  Service: Podiatry;;   AMPUTATION Right 05/29/2022   Procedure: RIGHT BELOW THE KNEE  AMPUTATION;  Surgeon: Timothy Ford, MD;  Location: [facility] OR;  Service:  Orthopedics;  Laterality: Right;   AMPUTATION Left 09/04/2022   Procedure: AMPUTATION FOOT, serial irrigation;  Surgeon: Jennefer Moats, DPM;  Location: [facility] OR;  Service: Podiatry;  Laterality: Left;  Surgical team to do block   AMPUTATION Left 10/07/2022   Procedure: LEFT BELOW KNEE AMPUTATION;  Surgeon: Timothy Ford, MD;  Location: [facility] OR;  Service: Orthopedics;  Laterality: Left;   AV FISTULA PLACEMENT Left 09/02/2017   Procedure: creation of left arm ARTERIOVENOUS (AV) FISTULA;  Surgeon: Margherita Shell, MD;  Location: [facility] OR;  Service: Vascular;  Laterality: Left;   COLONOSCOPY  2008   Dr. Nolene Baumgarten: normal    COLONOSCOPY N/A 12/18/2016   Dr. Nolene Baumgarten: multiple tubular adenomas, internal hemorrhoids. Surveillance in 3 years    ESOPHAGEAL DILATION N/A 10/13/2015   Procedure: ESOPHAGEAL DILATION;  Surgeon: Ruby Corporal, MD;  [facility];  Service: Endoscopy;  Laterality: N/A;   ESOPHAGOGASTRODUODENOSCOPY N/A 10/13/2015   Dr. Homero Luster: chronic gastritis on path, no H.pylori. Empiric dilation    ESOPHAGOGASTRODUODENOSCOPY N/A 12/18/2016   Dr. Nolene Baumgarten: mild gastritis. BRAVO study revealed uncontrolled GERD. Dysphagia secondary to uncontrolled reflux   FOOT SURGERY Bilateral    "nerve"     LEFT HEART CATH AND CORONARY ANGIOGRAPHY N/A 12/29/2018   Procedure: LEFT HEART CATH AND CORONARY ANGIOGRAPHY;  Surgeon: Lucendia Rusk, MD;  Location: [facility] INVASIVE CV LAB;  Service: Cardiovascular;  Laterality: N/A;   LOWER EXTREMITY ANGIOGRAPHY Right 05/04/2022   Procedure: Lower Extremity Angiography;  Surgeon: Kayla Part, MD;  Location: [facility] INVASIVE CV LAB;  Service: Cardiovascular;  Laterality: Right;   LUNG BIOPSY     MASS EXCISION Right 01/09/2013   Procedure: EXCISION OF NEOPLASM OF RIGHT  AXILLA  AND EXCISION OF NEOPLASM OF LEFT AXILLA;  Surgeon: Beau Bound, MD;  Location: [facility] ORS;  Service: General;  Laterality: Right;  procedure end @ 08:23   MYRINGOTOMY WITH TUBE PLACEMENT Bilateral  04/28/2017   Procedure: BILATERAL MYRINGOTOMY WITH TUBE PLACEMENT;  Surgeon: Reynold Caves, MD;  Location: [facility] OR;  Service: ENT;  Laterality: Bilateral;   PERIPHERAL VASCULAR BALLOON ANGIOPLASTY Right 05/04/2022   Procedure: PERIPHERAL VASCULAR BALLOON ANGIOPLASTY;  Surgeon: Kayla Part, MD;  Location: [facility] INVASIVE CV LAB;  Service: Cardiovascular;  Laterality: Right;  PT   PERIPHERAL VASCULAR INTERVENTION Right 05/04/2022   Procedure: PERIPHERAL VASCULAR INTERVENTION;  Surgeon: Kayla Part, MD;  Location: [facility] INVASIVE CV LAB;  Service: Cardiovascular;  Laterality: Right;  SFA   PERIPHERAL VASCULAR INTERVENTION Left 07/21/2022   Procedure: PERIPHERAL VASCULAR INTERVENTION;  Surgeon: Margherita Shell, MD;  [facility];  Service: Cardiovascular;  Laterality: Left;   REVISION OF ARTERIOVENOUS GORETEX GRAFT Left 05/04/2018   Procedure: TRANSPOSITION OF CEPHALIC VEIN ARTERIOVENOUS FISTULA LEFT ARM;  Surgeon: Mayo Speck, MD;  Location: [facility] OR;  Service: Vascular;  Laterality: Left;   SAVORY DILATION N/A 12/18/2016   Procedure: SAVORY DILATION;  Surgeon: Alyce Jubilee, MD;  [facility];  Service: Endoscopy;  Laterality: N/A;   TRANSMETATARSAL AMPUTATION Left 08/15/2022   Procedure: TRANSMETATARSAL AMPUTATION;  Surgeon: Floyce Hutching, DPM;  Location: [facility] OR;  Service: Podiatry;  Laterality: Left;   Social History   Occupational History   Occupation: retired   Tobacco Use   Smoking status: Never    Passive exposure: Never   Smokeless tobacco: Never   Tobacco comments:  Verified by Daughter, [name]  Vaping Use   Vaping status: Never Used  Substance and Sexual Activity   Alcohol use: No   Drug use: No   Sexual activity: Not Currently    Partners: Male

## 2023-08-12 NOTE — Telephone Encounter (Signed)
 WE WILL NOT BE FILLING THIS OUT AS PATIENT HAS NOT ORDERED THIS

## 2023-08-12 NOTE — Telephone Encounter (Signed)
 Copied from CRM [id number]. Topic: Clinical - Medical Advice >> Aug 11, 2023  5:06 PM Kevelyn M wrote: Reason for CRM: Rome Cluck. Calling about a lab requisition form that the Dr. Rodolph Clap needs to fill out. [phone] Phone/Fax [phone] or [phone]

## 2023-08-13 DIAGNOSIS — N2581 Secondary hyperparathyroidism of renal origin: Secondary | ICD-10-CM | POA: Diagnosis not present

## 2023-08-13 DIAGNOSIS — N186 End stage renal disease: Secondary | ICD-10-CM | POA: Diagnosis not present

## 2023-08-13 DIAGNOSIS — I959 Hypotension, unspecified: Secondary | ICD-10-CM | POA: Diagnosis not present

## 2023-08-13 DIAGNOSIS — Z992 Dependence on renal dialysis: Secondary | ICD-10-CM | POA: Diagnosis not present

## 2023-08-16 DIAGNOSIS — I959 Hypotension, unspecified: Secondary | ICD-10-CM | POA: Diagnosis not present

## 2023-08-16 DIAGNOSIS — Z992 Dependence on renal dialysis: Secondary | ICD-10-CM | POA: Diagnosis not present

## 2023-08-16 DIAGNOSIS — N2581 Secondary hyperparathyroidism of renal origin: Secondary | ICD-10-CM | POA: Diagnosis not present

## 2023-08-16 DIAGNOSIS — N186 End stage renal disease: Secondary | ICD-10-CM | POA: Diagnosis not present

## 2023-08-17 ENCOUNTER — Other Ambulatory Visit: Payer: Self-pay | Admitting: Family Medicine

## 2023-08-17 ENCOUNTER — Telehealth: Payer: Self-pay

## 2023-08-17 DIAGNOSIS — N186 End stage renal disease: Secondary | ICD-10-CM

## 2023-08-17 DIAGNOSIS — I96 Gangrene, not elsewhere classified: Secondary | ICD-10-CM

## 2023-08-18 ENCOUNTER — Encounter: Payer: Self-pay | Admitting: Family Medicine

## 2023-08-18 ENCOUNTER — Telehealth: Payer: Self-pay

## 2023-08-18 DIAGNOSIS — Z992 Dependence on renal dialysis: Secondary | ICD-10-CM | POA: Diagnosis not present

## 2023-08-18 DIAGNOSIS — N186 End stage renal disease: Secondary | ICD-10-CM | POA: Diagnosis not present

## 2023-08-18 DIAGNOSIS — N2581 Secondary hyperparathyroidism of renal origin: Secondary | ICD-10-CM | POA: Diagnosis not present

## 2023-08-18 DIAGNOSIS — I959 Hypotension, unspecified: Secondary | ICD-10-CM | POA: Diagnosis not present

## 2023-08-18 NOTE — Progress Notes (Signed)
 Complex Care Management Note  Care Guide Note 08/18/2023 Name: Kathryn Beck MRN: [id number] DOB: 01/18/1956  Kathryn Beck is a 68 y.o. year old female who sees Towanda Fret, MD for primary care. I reached out to [name] by phone today to offer complex care management services.  [name] was given information about Complex Care Management services today including:   The Complex Care Management services include support from the care team which includes your Nurse Care Manager, Clinical Social Worker, or Pharmacist.  The Complex Care Management team is here to help remove barriers to the health concerns and goals most important to you. Complex Care Management services are voluntary, and the patient may decline or stop services at any time by request to their care team member.   Complex Care Management Consent Status: Patient agreed to services and verbal consent obtained.   Follow up plan:  Telephone appointment with complex care management team member scheduled for:  LCSW 08/27/2023 and RNCM 09/09/2023  Encounter Outcome:  Patient Scheduled  Lenton Rail , RMA     [place]  [facility], [facility] Guide  Direct Dial : [phone]  Website: [email or web address]

## 2023-08-19 ENCOUNTER — Ambulatory Visit (INDEPENDENT_AMBULATORY_CARE_PROVIDER_SITE_OTHER): Admitting: Neurology

## 2023-08-19 ENCOUNTER — Encounter: Payer: Self-pay | Admitting: Neurology

## 2023-08-19 DIAGNOSIS — G5603 Carpal tunnel syndrome, bilateral upper limbs: Secondary | ICD-10-CM

## 2023-08-19 NOTE — Progress Notes (Signed)
 Full Name: Josue Kass Gender: Female MRN #: [id number] Date of Birth: 05-19-1955    Visit Date: 08/19/2023 08:50 Age: 68 Years   REPORT IN PROGRESS   History: Evaluate for CTS, hx of bilateral CTS and hand pain. Additionally on limited exam, ulnar ADM muscle atrophy and weakness left hand interossei > right clinical correlation recommended. Patient asks we call her daughter with results.   Summary: Nerve Conduction Studies were performed on the bilateral upper extremities: The left median APB motor nerve showed prolonged distal onset latency (4.5 ms, N<4.4) and reduced amplitude(1.9 mV, N>4). The right  median APB motor nerve showed prolonged distal onset latency (6.3 ms, N<4.4) and reduced amplitude(0.8 mV, N>4) and decreased conduction velocity (46 m/s, normal greater than 49).The left ulnar ADM motor nerve showed prolonged distal onset latency(3.26ms, N<3.3) and reduced amplitude (0.65mv, N>6) with a 50m/s conduction velocity drop across the elbow.  The right ulnar ADM motor nerve showed reduced amplitude (0.5 mV, normal greater than 6). The left radial sensory nerve showed reduced amplitude (13 V, normal greater than 15).The left Median 2nd Digit orthodromic sensory nerve showed no response. The right  Median 2nd Digit orthodromic sensory nerve showed reduced amplitude(6 V, N>10). The left ulnar fifth digit orthodromic sensory nerve showed prolonged distal peak latency (3.5 ms, normal less than 3.1). The right ulnar fifth digit orthodromic sensory nerve showed prolonged distal peak latency (3.5 ms, normal less than 3.1) and reduced amplitude (4 V, normal greater than 5). The left median/ulnar (palm) comparison nerve showed prolonged distal peak latency (Median Palm, 3.5 ms, N<2.2) and prolonged distal peak latency (ulnar palm, 2.6 ms, normal less than 2.2) with abnormal peak latency difference (Median Palm-Ulnar Palm, 0.9 ms, N<0.4) with a relative median delay.  The right median/ulnar  (palm) comparison nerve showed prolonged distal peak latency (Median Palm, 4.7 ms, N<2.2) and prolonged distal peak latency (ulnar palm, 2.8 ms, normal less than 2.2)  and abnormal peak latency difference (Median Palm-Ulnar Palm, 2.0 ms, N<0.4) with a relative median delay.  Any remaining nerves (as indicated in the following tables) were within normal limits.EMG needle study performed on the bilateral upper extremities: The left first dorsal interosseous muscle showed decreased insertional activity, increased motor unit amplitude, polyphasic motor units and diminished motor unit recruitment.The left opponens pollicis showed polyphasic motor units and diminished motor unit recruitment.  The right opponens pollicis showed spontaneous activity, prolonged motor unit duration, polyphasic motor units and diminished motor unit recruitment.  The left flexor digitorum profundus ulnar head showed prolonged motor unit duration, polyphasic motor units and diminished motor unit recruitment.. All remaining muscles (as indicated in the following tables) were within normal limits.      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 Right-sided Ulnar neuropathy which could not be localized on this exam. 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  [facility] [address], [address] [place], [place] [address] Tel: [phone] Fax: [phone]  Verbal informed consent was obtained from the patient, patient was informed of potential risk of procedure, including bruising, bleeding, hematoma formation, infection, muscle weakness, muscle pain, numbness, among others.         MNC    Nerve / Sites Muscle Latency Ref. Amplitude Ref. Rel  Amp Segments Distance Velocity Ref. Area    ms ms mV mV %  cm m/s m/s mVms  L Median - APB     Wrist APB 4.5 <=4.4 1.9 >=4.0 100 Wrist - APB 7   4.3     Upper arm APB 10.3  2.1  114 Upper arm - Wrist 30 52 >=49 6.6  R Median - APB     Wrist APB 6.3 <=4.4 0.8 >=4.0 100 Wrist - APB 7   1.9     Upper arm APB 13.1  0.6  72.7 Upper arm - Wrist 31 46 >=49 0.9  L Ulnar - ADM     Wrist ADM 3.7 <=3.3 0.9 >=6.0 100 Wrist - ADM 7   0.9     B.Elbow ADM 6.6  3.8  443 B.Elbow - Wrist 28 65 >=49 10.1     A.Elbow ADM 9.5  3.3  86.6 A.Elbow - B.Elbow 10 42 >=49 9.1  R Ulnar - ADM     Wrist ADM 3.3 <=3.3 0.5 >=6.0 100 Wrist - ADM 7   0.4     B.Elbow ADM 8.7  0.4  129 B.Elbow - Wrist 29 54 >=49 0.8     A.Elbow ADM 10.9  0.2  51.2 A.Elbow - B.Elbow 12 55 >=49 0.5               SNC    Nerve / Sites Rec. Site Peak Lat Ref.  Amp Ref. Segments Distance Peak Diff Ref.    ms ms V V  cm ms ms  L Radial - Anatomical snuff box (Forearm)     Forearm Wrist 2.5 <=2.9 13 >=15 Forearm - Wrist 10    L Median, Ulnar - Transcarpal comparison     Median Palm Wrist 3.5 <=2.2 14 >=35 Median Palm - Wrist 8       Ulnar Palm Wrist 2.6 <=2.2 8 >=12 Ulnar Palm - Wrist 8          Median Palm - Ulnar Palm  0.9 <=0.4  R Median, Ulnar - Transcarpal comparison     Median Palm Wrist 4.7 <=2.2 8 >=35 Median Palm - Wrist 8       Ulnar Palm Wrist 2.8 <=2.2 7 >=12 Ulnar Palm - Wrist 8          Median Palm - Ulnar Palm  2.0 <=0.4  L Median - Orthodromic (Dig II, Mid palm)     Dig II Wrist NR <=3.4 NR >=10 Dig II - Wrist 13    R Median - Orthodromic (Dig II, Mid palm)     Dig II Wrist 3.1 <=3.4 6 >=10 Dig II - Wrist 13    L Ulnar - Orthodromic, (Dig V, Mid palm)     Dig V Wrist 3.5 <=3.1 5 >=5 Dig V - Wrist 11    R Ulnar - Orthodromic, (Dig V, Mid palm)     Dig V Wrist 3.5 <=3.1 4 >=5 Dig V - Wrist 11                     EMG Summary Table    Spontaneous MUAP  Recruitment  Muscle IA Fib PSW Fasc Other Amp Dur. Poly Pattern  R. Cervical paraspinals (low) Normal None None None _______ Normal Normal Normal Normal  L. Cervical paraspinals Normal None None None _______ Normal Normal Normal Normal  R. Cervical paraspinals Normal None None None _______ Normal Normal Normal Normal  L. Deltoid Normal None  None None _______ Normal Normal Normal Normal  R. Deltoid Normal None None None _______ Normal Normal Normal Normal  L. Triceps brachii Normal None None None _______ Normal Normal Normal Normal  R. Triceps brachii Normal None None None _______ Normal Normal Normal Normal  L. Pronator teres Normal None None None _______ Normal Normal Normal Normal  R. Pronator teres Normal None None None _______ Normal Normal Normal Normal  L. First dorsal interosseous Decreased None None None _______ Increased Normal 3+ Reduced  R. First dorsal interosseous Normal None None None _______ Normal Normal Normal Normal  L. Opponens pollicis Normal None None None _______ Normal Normal 2+ Reduced  R. Opponens pollicis Normal None 2+ None _______ Normal Increased 3+ Reduced  L. Extensor indicis proprius Normal None None None _______ Normal Normal Normal Normal  R. Extensor indicis proprius Normal None None None _______ Normal Normal Normal Normal  R. Flexor digitorum profundus (Ulnar) Normal None None None _______ Normal Normal Normal Normal  L. Flexor digitorum profundus (Ulnar) Normal None None None _______ Normal Increased 4+ Reduced

## 2023-08-20 ENCOUNTER — Ambulatory Visit (HOSPITAL_BASED_OUTPATIENT_CLINIC_OR_DEPARTMENT_OTHER): Attending: Pulmonary Disease | Admitting: Pulmonary Disease

## 2023-08-20 DIAGNOSIS — G4733 Obstructive sleep apnea (adult) (pediatric): Secondary | ICD-10-CM | POA: Diagnosis not present

## 2023-08-20 DIAGNOSIS — Z992 Dependence on renal dialysis: Secondary | ICD-10-CM | POA: Diagnosis not present

## 2023-08-20 DIAGNOSIS — I959 Hypotension, unspecified: Secondary | ICD-10-CM | POA: Diagnosis not present

## 2023-08-20 DIAGNOSIS — N186 End stage renal disease: Secondary | ICD-10-CM | POA: Diagnosis not present

## 2023-08-20 DIAGNOSIS — N2581 Secondary hyperparathyroidism of renal origin: Secondary | ICD-10-CM | POA: Diagnosis not present

## 2023-08-21 DIAGNOSIS — Z992 Dependence on renal dialysis: Secondary | ICD-10-CM | POA: Diagnosis not present

## 2023-08-21 DIAGNOSIS — N186 End stage renal disease: Secondary | ICD-10-CM | POA: Diagnosis not present

## 2023-08-23 ENCOUNTER — Encounter: Payer: Self-pay | Admitting: Family

## 2023-08-23 ENCOUNTER — Telehealth: Payer: Self-pay | Admitting: Pulmonary Disease

## 2023-08-23 ENCOUNTER — Other Ambulatory Visit: Payer: Self-pay | Admitting: Family Medicine

## 2023-08-23 ENCOUNTER — Other Ambulatory Visit: Payer: Self-pay

## 2023-08-23 ENCOUNTER — Encounter (HOSPITAL_COMMUNITY): Payer: Self-pay | Admitting: Hematology

## 2023-08-23 DIAGNOSIS — G4733 Obstructive sleep apnea (adult) (pediatric): Secondary | ICD-10-CM

## 2023-08-23 DIAGNOSIS — N186 End stage renal disease: Secondary | ICD-10-CM | POA: Diagnosis not present

## 2023-08-23 DIAGNOSIS — N2581 Secondary hyperparathyroidism of renal origin: Secondary | ICD-10-CM | POA: Diagnosis not present

## 2023-08-23 DIAGNOSIS — Z992 Dependence on renal dialysis: Secondary | ICD-10-CM | POA: Diagnosis not present

## 2023-08-23 MED ORDER — VELPHORO 500 MG PO CHEW
500.0000 mg | CHEWABLE_TABLET | ORAL | 0 refills | Status: DC
  Filled 2023-08-23 – 2023-10-01 (×7): qty 90, 15d supply, fill #0

## 2023-08-23 NOTE — Telephone Encounter (Signed)
 Rx to DME for [employer] EPAP min 10, PS +4, IPAP max 24  with back up rate of 16 with a small F& P full face mask.  Needs Fu OV with me in 6-8 wks

## 2023-08-23 NOTE — Procedures (Signed)
 [facility] [address] [place], [place] [address] Tel: [phone]   Fax: [phone]  Titration Interpretation  Patient Name:  Kathryn Beck, Kathryn Beck Date:  08/20/2023 Referring Physician:  Celene Coins, MD  Indications for Polysomnography The patient is a 68 year-old Female who is 5\' 4"  and weighs 180.0 lbs. Her BMI equals 31.1.  A full night titration treatment study was performed. NPSG 10/2018 -222 lbs -AHI 72/hour, low desat 83%, no supine sleep    Polysomnogram Data A full night polysomnogram recorded the standard physiologic parameters including EEG, EOG, EMG, EKG, nasal and oral airflow.  Respiratory parameters of chest and abdominal movements were recorded with Respiratory Inductance Plethysmography belts.  Oxygen  saturation was recorded by pulse oximetry.   Sleep Architecture The total recording time of the polysomnogram was 471.6 minutes.  The total sleep time was 307.5 minutes.  The patient spent 14.3% of total sleep time in Stage N1, 76.7% in Stage N2, 0.0% in Stages N3, and 8.9% in REM.  Sleep latency was 22.4 minutes.  REM latency was 98.5 minutes.  Sleep Efficiency was 65.2%.  Wake after Sleep Onset time was 141.5 minutes.  Titration Summary The patient was titrated at pressures ranging from 5* cm/H20 with supplemental oxygen  at - up to 24/20/16** cm/H20 with supplemental oxygen  at -.  The last pressure used in the study was 24/20/16** cm/H20 with supplemental oxygen  at -.  Respiratory Events The polysomnogram revealed a presence of 19 obstructive, 53 central, and - mixed apneas resulting in an Apnea index of 14.0 events per hour.  There were 218 hypopneas (>=3% desaturation and/or arousal) resulting in an Apnea\Hypopnea Index (AHI >=3% desaturation and/or arousal) of 56.6 events per hour.  There were 127 hypopneas (>=4% desaturation) resulting in an Apnea\Hypopnea Index (AHI >=4% desaturation) of 38.8 events per hour.  There were 15  Respiratory Effort Related Arousals resulting in a RERA index of 2.9 events per hour. The Respiratory Disturbance Index is 59.5 events per hour.  The snore index was - events per hour.  Mean oxygen  saturation was 93.9%.  The lowest oxygen  saturation during sleep was 83.0%.  Time spent <=88% oxygen  saturation was 21.8 minutes (4.6%).  Limb Activity There were - limb movements recorded.  Of this total, - were classified as PLMs.  Of the PLMs, - were associated with arousals.  The Limb Movement index was - per hour while the PLM index was - per hour.  Cardiac Summary The average pulse rate was 69.4 bpm.  The minimum pulse rate was 60.0 bpm while the maximum pulse rate was 78.0 bpm.  Cardiac rhythm was normal/abnormal.  Diagnosis: OSA Treatment emergent central apneas on CPAP Corrected by Bilevel with back up rate  Recommendations: Bilevel 24/20 cm was optimal with back up rate of 16 with a small F& P full face mask. For comfort, suggest using autobilevel Close clinical follow up with compliance monitoring to optimize therapeutic efficiency Weight loss measures encouraged She should be cautioned against driving when sleepy & against medications with sedative side effects    This study was personally reviewed and electronically signed by: Celene Coins, MD Accredited Board Certified in Sleep Medicine

## 2023-08-24 ENCOUNTER — Other Ambulatory Visit: Payer: Self-pay

## 2023-08-24 NOTE — Telephone Encounter (Signed)
 Scheduled for 7/17 with RA at 4. Nothing further needed.

## 2023-08-24 NOTE — Telephone Encounter (Signed)
 Order placed

## 2023-08-24 NOTE — Telephone Encounter (Signed)
 Needs Fu OV with me in 6-8 wks per Dr Villa Greaser

## 2023-08-25 ENCOUNTER — Other Ambulatory Visit: Payer: Self-pay

## 2023-08-25 DIAGNOSIS — S88912A Complete traumatic amputation of left lower leg, level unspecified, initial encounter: Secondary | ICD-10-CM | POA: Diagnosis not present

## 2023-08-25 DIAGNOSIS — N2581 Secondary hyperparathyroidism of renal origin: Secondary | ICD-10-CM | POA: Diagnosis not present

## 2023-08-25 DIAGNOSIS — N186 End stage renal disease: Secondary | ICD-10-CM | POA: Diagnosis not present

## 2023-08-25 DIAGNOSIS — I96 Gangrene, not elsewhere classified: Secondary | ICD-10-CM | POA: Diagnosis not present

## 2023-08-25 DIAGNOSIS — Z992 Dependence on renal dialysis: Secondary | ICD-10-CM | POA: Diagnosis not present

## 2023-08-26 ENCOUNTER — Ambulatory Visit: Payer: Medicare Other | Admitting: Family Medicine

## 2023-08-26 ENCOUNTER — Encounter: Payer: Self-pay | Admitting: Family Medicine

## 2023-08-26 VITALS — BP 138/73 | HR 86 | Resp 18 | Ht 64.0 in

## 2023-08-26 DIAGNOSIS — R531 Weakness: Secondary | ICD-10-CM | POA: Diagnosis not present

## 2023-08-26 DIAGNOSIS — D472 Monoclonal gammopathy: Secondary | ICD-10-CM | POA: Diagnosis not present

## 2023-08-26 DIAGNOSIS — Z794 Long term (current) use of insulin: Secondary | ICD-10-CM | POA: Diagnosis not present

## 2023-08-26 DIAGNOSIS — N63 Unspecified lump in unspecified breast: Secondary | ICD-10-CM | POA: Diagnosis not present

## 2023-08-26 DIAGNOSIS — I96 Gangrene, not elsewhere classified: Secondary | ICD-10-CM | POA: Diagnosis not present

## 2023-08-26 DIAGNOSIS — F321 Major depressive disorder, single episode, moderate: Secondary | ICD-10-CM

## 2023-08-26 DIAGNOSIS — Z758 Other problems related to medical facilities and other health care: Secondary | ICD-10-CM

## 2023-08-26 DIAGNOSIS — E782 Mixed hyperlipidemia: Secondary | ICD-10-CM

## 2023-08-26 DIAGNOSIS — F411 Generalized anxiety disorder: Secondary | ICD-10-CM

## 2023-08-26 DIAGNOSIS — I10 Essential (primary) hypertension: Secondary | ICD-10-CM

## 2023-08-26 DIAGNOSIS — R6889 Other general symptoms and signs: Secondary | ICD-10-CM

## 2023-08-26 DIAGNOSIS — E1165 Type 2 diabetes mellitus with hyperglycemia: Secondary | ICD-10-CM

## 2023-08-26 MED ORDER — ACETAMINOPHEN ER 650 MG PO TBCR: EXTENDED_RELEASE_TABLET | ORAL | 3 refills | Status: DC

## 2023-08-26 MED ORDER — MELOXICAM 7.5 MG PO TABS: 7.5000 mg | ORAL_TABLET | Freq: Every day | ORAL | 3 refills | Status: DC

## 2023-08-26 NOTE — Patient Instructions (Addendum)
 Please schedule awv and [facility] of breasts at checkout   Have paperwork sent  to request electric wheelchair  Please take cymbalta  every day  New for pain is meloxicam 7.5 mg every day, take tylenoll once or twice daily, take only one tylenopl if you take percocet  Thanks for choosing [facility], we consider it a privelige to serve you.

## 2023-08-27 ENCOUNTER — Other Ambulatory Visit: Admitting: Licensed Clinical Social Worker

## 2023-08-27 DIAGNOSIS — N186 End stage renal disease: Secondary | ICD-10-CM | POA: Diagnosis not present

## 2023-08-27 DIAGNOSIS — N2581 Secondary hyperparathyroidism of renal origin: Secondary | ICD-10-CM | POA: Diagnosis not present

## 2023-08-27 DIAGNOSIS — Z992 Dependence on renal dialysis: Secondary | ICD-10-CM | POA: Diagnosis not present

## 2023-08-29 NOTE — Procedures (Signed)
 Full Name: Kathryn Beck Gender: Female MRN #: [id number] Date of Birth: January 20, 1956    Visit Date: 08/19/2023 08:50 Age: 68 Years   REPORT IN PROGRESS   History: Evaluate for CTS, hx of bilateral CTS and hand pain. Additionally on limited exam, ulnar ADM muscle atrophy and weakness left hand interossei > right clinical correlation recommended. Patient asks we call her daughter with results.   Summary: Nerve Conduction Studies were performed on the bilateral upper extremities: The left median APB motor nerve showed prolonged distal onset latency (4.5 ms, N<4.4) and reduced amplitude(1.9 mV, N>4). The right  median APB motor nerve showed prolonged distal onset latency (6.3 ms, N<4.4) and reduced amplitude(0.8 mV, N>4) and decreased conduction velocity (46 m/s, normal greater than 49).The left ulnar ADM motor nerve showed prolonged distal onset latency(3.47ms, N<3.3) and reduced amplitude (0.45mv, N>6) with a 42m/s conduction velocity drop across the elbow.  The right ulnar ADM motor nerve showed reduced amplitude (0.5 mV, normal greater than 6). The left radial sensory nerve showed reduced amplitude (13 V, normal greater than 15).The left Median 2nd Digit orthodromic sensory nerve showed no response. The right  Median 2nd Digit orthodromic sensory nerve showed reduced amplitude(6 V, N>10). The left ulnar fifth digit orthodromic sensory nerve showed prolonged distal peak latency (3.5 ms, normal less than 3.1). The right ulnar fifth digit orthodromic sensory nerve showed prolonged distal peak latency (3.5 ms, normal less than 3.1) and reduced amplitude (4 V, normal greater than 5). The left median/ulnar (palm) comparison nerve showed prolonged distal peak latency (Median Palm, 3.5 ms, N<2.2) and prolonged distal peak latency (ulnar palm, 2.6 ms, normal less than 2.2) with abnormal peak latency difference (Median Palm-Ulnar Palm, 0.9 ms, N<0.4) with a relative median delay.  The right median/ulnar  (palm) comparison nerve showed prolonged distal peak latency (Median Palm, 4.7 ms, N<2.2) and prolonged distal peak latency (ulnar palm, 2.8 ms, normal less than 2.2)  and abnormal peak latency difference (Median Palm-Ulnar Palm, 2.0 ms, N<0.4) with a relative median delay.  Any remaining nerves (as indicated in the following tables) were within normal limits.EMG needle study performed on the bilateral upper extremities: The left first dorsal interosseous muscle showed decreased insertional activity, increased motor unit amplitude, polyphasic motor units and diminished motor unit recruitment.The left opponens pollicis showed polyphasic motor units and diminished motor unit recruitment.  The right opponens pollicis showed spontaneous activity, prolonged motor unit duration, polyphasic motor units and diminished motor unit recruitment.  The left flexor digitorum profundus ulnar head showed prolonged motor unit duration, polyphasic motor units and diminished motor unit recruitment.. All remaining muscles (as indicated in the following tables) were within normal limits.      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 Right-sided Ulnar neuropathy which could not be localized on this exam. 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  [facility] [address], [address] [place], [place] [address] Tel: [phone] Fax: [phone]  Verbal informed consent was obtained from the patient, patient was informed of potential risk of procedure, including bruising, bleeding, hematoma formation, infection, muscle weakness, muscle pain, numbness, among others.         MNC    Nerve / Sites Muscle Latency Ref. Amplitude Ref. Rel  Amp Segments Distance Velocity Ref. Area    ms ms mV mV %  cm m/s m/s mVms  L Median - APB     Wrist APB 4.5 <=4.4 1.9 >=4.0 100 Wrist - APB 7   4.3     Upper arm APB 10.3  2.1  114 Upper arm - Wrist 30 52 >=49 6.6  R Median - APB     Wrist APB 6.3 <=4.4 0.8 >=4.0 100 Wrist - APB 7   1.9     Upper arm APB 13.1  0.6  72.7 Upper arm - Wrist 31 46 >=49 0.9  L Ulnar - ADM     Wrist ADM 3.7 <=3.3 0.9 >=6.0 100 Wrist - ADM 7   0.9     B.Elbow ADM 6.6  3.8  443 B.Elbow - Wrist 28 65 >=49 10.1     A.Elbow ADM 9.5  3.3  86.6 A.Elbow - B.Elbow 10 42 >=49 9.1  R Ulnar - ADM     Wrist ADM 3.3 <=3.3 0.5 >=6.0 100 Wrist - ADM 7   0.4     B.Elbow ADM 8.7  0.4  129 B.Elbow - Wrist 29 54 >=49 0.8     A.Elbow ADM 10.9  0.2  51.2 A.Elbow - B.Elbow 12 55 >=49 0.5               SNC    Nerve / Sites Rec. Site Peak Lat Ref.  Amp Ref. Segments Distance Peak Diff Ref.    ms ms V V  cm ms ms  L Radial - Anatomical snuff box (Forearm)     Forearm Wrist 2.5 <=2.9 13 >=15 Forearm - Wrist 10    L Median, Ulnar - Transcarpal comparison     Median Palm Wrist 3.5 <=2.2 14 >=35 Median Palm - Wrist 8       Ulnar Palm Wrist 2.6 <=2.2 8 >=12 Ulnar Palm - Wrist 8          Median Palm - Ulnar Palm  0.9 <=0.4  R Median, Ulnar - Transcarpal comparison     Median Palm Wrist 4.7 <=2.2 8 >=35 Median Palm - Wrist 8       Ulnar Palm Wrist 2.8 <=2.2 7 >=12 Ulnar Palm - Wrist 8          Median Palm - Ulnar Palm  2.0 <=0.4  L Median - Orthodromic (Dig II, Mid palm)     Dig II Wrist NR <=3.4 NR >=10 Dig II - Wrist 13    R Median - Orthodromic (Dig II, Mid palm)     Dig II Wrist 3.1 <=3.4 6 >=10 Dig II - Wrist 13    L Ulnar - Orthodromic, (Dig V, Mid palm)     Dig V Wrist 3.5 <=3.1 5 >=5 Dig V - Wrist 11    R Ulnar - Orthodromic, (Dig V, Mid palm)     Dig V Wrist 3.5 <=3.1 4 >=5 Dig V - Wrist 11                     EMG Summary Table    Spontaneous MUAP  Recruitment  Muscle IA Fib PSW Fasc Other Amp Dur. Poly Pattern  R. Cervical paraspinals (low) Normal None None None _______ Normal Normal Normal Normal  L. Cervical paraspinals Normal None None None _______ Normal Normal Normal Normal  R. Cervical paraspinals Normal None None None _______ Normal Normal Normal Normal  L. Deltoid Normal None  None None _______ Normal Normal Normal Normal  R. Deltoid Normal None None None _______ Normal Normal Normal Normal  L. Triceps brachii Normal None None None _______ Normal Normal Normal Normal  R. Triceps brachii Normal None None None _______ Normal Normal Normal Normal  L. Pronator teres Normal None None None _______ Normal Normal Normal Normal  R. Pronator teres Normal None None None _______ Normal Normal Normal Normal  L. First dorsal interosseous Decreased None None None _______ Increased Normal 3+ Reduced  R. First dorsal interosseous Normal None None None _______ Normal Normal Normal Normal  L. Opponens pollicis Normal None None None _______ Normal Normal 2+ Reduced  R. Opponens pollicis Normal None 2+ None _______ Normal Increased 3+ Reduced  L. Extensor indicis proprius Normal None None None _______ Normal Normal Normal Normal  R. Extensor indicis proprius Normal None None None _______ Normal Normal Normal Normal  R. Flexor digitorum profundus (Ulnar) Normal None None None _______ Normal Normal Normal Normal  L. Flexor digitorum profundus (Ulnar) Normal None None None _______ Normal Increased 4+ Reduced

## 2023-08-30 ENCOUNTER — Encounter: Payer: Self-pay | Admitting: Family Medicine

## 2023-08-30 ENCOUNTER — Telehealth: Payer: Self-pay | Admitting: Orthopedic Surgery

## 2023-08-30 DIAGNOSIS — N2581 Secondary hyperparathyroidism of renal origin: Secondary | ICD-10-CM | POA: Diagnosis not present

## 2023-08-30 DIAGNOSIS — E1165 Type 2 diabetes mellitus with hyperglycemia: Secondary | ICD-10-CM | POA: Insufficient documentation

## 2023-08-30 DIAGNOSIS — I96 Gangrene, not elsewhere classified: Secondary | ICD-10-CM | POA: Insufficient documentation

## 2023-08-30 DIAGNOSIS — F411 Generalized anxiety disorder: Secondary | ICD-10-CM | POA: Insufficient documentation

## 2023-08-30 DIAGNOSIS — E1169 Type 2 diabetes mellitus with other specified complication: Secondary | ICD-10-CM | POA: Insufficient documentation

## 2023-08-30 DIAGNOSIS — N186 End stage renal disease: Secondary | ICD-10-CM | POA: Diagnosis not present

## 2023-08-30 DIAGNOSIS — F321 Major depressive disorder, single episode, moderate: Secondary | ICD-10-CM | POA: Insufficient documentation

## 2023-08-30 DIAGNOSIS — Z992 Dependence on renal dialysis: Secondary | ICD-10-CM | POA: Diagnosis not present

## 2023-08-30 DIAGNOSIS — Z758 Other problems related to medical facilities and other health care: Secondary | ICD-10-CM | POA: Insufficient documentation

## 2023-08-30 DIAGNOSIS — N63 Unspecified lump in unspecified breast: Secondary | ICD-10-CM | POA: Insufficient documentation

## 2023-08-30 DIAGNOSIS — F322 Major depressive disorder, single episode, severe without psychotic features: Secondary | ICD-10-CM | POA: Insufficient documentation

## 2023-08-30 DIAGNOSIS — R531 Weakness: Secondary | ICD-10-CM | POA: Insufficient documentation

## 2023-08-30 NOTE — Assessment & Plan Note (Signed)
 Hyperlipidemia:Low fat diet discussed and encouraged.   Lipid Panel  Lab Results  Component Value Date   CHOL 123 12/18/2022   HDL 41 12/18/2022   LDLCALC 61 12/18/2022   LDLDIRECT 120.0 11/21/2020   TRIG 105 12/18/2022   CHOLHDL 3.0 12/18/2022     Updated lab needed at/ before next visit. Currently controlled

## 2023-08-30 NOTE — Assessment & Plan Note (Signed)
 Buspar  prescribed, needs to take as directed

## 2023-08-30 NOTE — Assessment & Plan Note (Signed)
 Followed by oncology

## 2023-08-30 NOTE — Assessment & Plan Note (Signed)
 Diabetes associated with hypertension, hyperlipidemia, CKD, vascular disease, and poor wound healing  Ms. Bowditch is reminded of the importance of commitment to daily physical activity for 30 minutes or more, as able and the need to limit carbohydrate intake to 30 to 60 grams per meal to help with blood sugar control.   The need to take medication as prescribed, test blood sugar as directed, and to call between visits if there is a concern that blood sugar is uncontrolled is also discussed.   Ms. Weatherholtz is reminded of the importance of daily foot exam, annual eye examination, and good blood sugar, blood pressure and cholesterol control.     Latest Ref Rng & Units 05/02/2023    7:06 AM 05/01/2023    4:23 AM 04/30/2023    1:30 PM 04/20/2023   10:05 AM 12/28/2022    8:42 AM  Diabetic Labs  HbA1c 4.0 - 5.6 %    8.4    Creatinine 0.44 - 1.00 mg/dL 9.56  2.13  0.86   5.78       08/26/2023    9:14 AM 08/20/2023    8:15 PM 08/19/2023    8:09 AM 08/11/2023   12:17 PM 08/11/2023   12:15 PM 08/03/2023   10:36 AM 07/27/2023   10:52 AM  BP/Weight  Systolic BP 138  469  163 154 629  Diastolic BP 73  67  63 77 73  Wt. (Lbs)  180  167.99   168  BMI  30.9 kg/m2  30.73 kg/m2   30.73 kg/m2      Latest Ref Rng & Units 04/16/2022   11:20 AM 07/24/2021    3:42 PM  Foot/eye exam completion dates  Eye Exam No Retinopathy  No Retinopathy      Foot Form Completion  Done      This result is from an external source.      Uncontrolled, managed by Endo

## 2023-08-30 NOTE — Assessment & Plan Note (Addendum)
 Being followed by orthopedics, will likely need partial amputation of left digit

## 2023-08-30 NOTE — Assessment & Plan Note (Signed)
 Mass reported on right breast, mammogram / imaging past due, ultrasound requested as unable to stand, still has not been fited with prosthesis

## 2023-08-30 NOTE — Telephone Encounter (Signed)
 Dr. Julio Ohm saw this pt in the office for gangrene bilat middle fingers. Did he fill out a blue sheet for surgery? The daughter is calling and wants to set up for the left hand?

## 2023-08-30 NOTE — Progress Notes (Signed)
 Kathryn Beck     MRN: [id number]      DOB: 1955/10/23  Chief Complaint  Patient presents with   Medical Management of Chronic Issues    4 month follow up    Breast Mass    Pt states she and her husband felt a lump on right breast    HPI Kathryn Beck is here for follow up and re-evaluation of chronic medical conditions, medication management and review of any available recent lab and radiology data.  Preventive health is updated, specifically  Cancer screening and Immunization.   Questions or concerns regarding consultations or procedures which the PT has had in the interim are  addressed. The PT denies any adverse reactions to current medications since the last visit.  C/o right breast lump Requests power chair for use in her home to get around, needs appointment for fitting lower extremity prosthesis Currently has necrotic left finger, will likely need partial amputation   ROS Denies recent fever or chills. Denies sinus pressure, nasal congestion, ear pain or sore throat. Denies chest congestion, productive cough or wheezing. Denies chest pains, palpitations and leg swelling Denies abdominal pain, nausea, vomiting,diarrhea or constipation.   Denies dysuria, frequency, hesitancy or incontinence. . C/o  depression, not suicidal or homicidal, c/o anxiety or insomnia. Kathryn Beck   PE  BP 138/73   Pulse 86   Resp 18   Ht 5\' 4"  (1.626 m)   SpO2 93%   BMI 30.90 kg/m   Patient alert and oriented and in no cardiopulmonary distress.  HEENT: No facial asymmetry, EOMI,     Neck decreased ROM.  Chest: Clear to auscultation bilaterally.  Breast: no palpable mass on breasts or in axillae, no nipple discharge noted, no supraclavicular or axillary nodes palpable  CVS: S1, S2 no murmurs, no S3.Regular rate.  ABD: Soft non tender.   Ext: bilateral AKA  MS: decreased  ROM spine, shoulders, hips and knees.  Skin: necrotic fingers bilaterally.  Psych: Good eye contact, normal affect.  Memory intact both  anxious and  depressed appearing.  CNS: CN 2-12 intact, power,  normal throughout.no focal deficits noted.   Assessment & Plan  Mass of breast Mass reported on right breast, mammogram / imaging past due, ultrasound requested as unable to stand, still has not been fited with prosthesis  Malignant hypertension Controlled, no change in medication   Uncontrolled type 2 diabetes mellitus          Type 2 diabetes mellitus with hyperglycemia (HCC) Diabetes associated with hypertension, hyperlipidemia, CKD, vascular disease, and poor wound healing  Kathryn Beck is reminded of the importance of commitment to daily physical activity for 30 minutes or more, as able and the need to limit carbohydrate intake to 30 to 60 grams per meal to help with blood sugar control.   The need to take medication as prescribed, test blood sugar as directed, and to call between visits if there is a concern that blood sugar is uncontrolled is also discussed.   Kathryn Beck is reminded of the importance of daily foot exam, annual eye examination, and good blood sugar, blood pressure and cholesterol control.     Latest Ref Rng & Units 05/02/2023    7:06 AM 05/01/2023    4:23 AM 04/30/2023    1:30 PM 04/20/2023   10:05 AM 12/28/2022    8:42 AM  Diabetic Labs  HbA1c 4.0 - 5.6 %    8.4    Creatinine 0.44 - 1.00 mg/dL 9.62  3.41  6.74   6.73       08/26/2023    9:14 AM 08/20/2023    8:15 PM 08/19/2023    8:09 AM 08/11/2023   12:17 PM 08/11/2023   12:15 PM 08/03/2023   10:36 AM 07/27/2023   10:52 AM  BP/Weight  Systolic BP 138  161  163 154 096  Diastolic BP 73  67  63 77 73  Wt. (Lbs)  180  167.99   168  BMI  30.9 kg/m2  30.73 kg/m2   30.73 kg/m2      Latest Ref Rng & Units 04/16/2022   11:20 AM 07/24/2021    3:42 PM  Foot/eye exam completion dates  Eye Exam No Retinopathy  No Retinopathy      Foot Form Completion  Done      This result is from an external source.      Uncontrolled,  managed by Endo  Depression, major, single episode, moderate (HCC) Needs to take medication aily as prescribed, therapy offered , no interest currently  GAD (generalized anxiety disorder) Buspar  prescribed, needs to take as directed  Hyperlipidemia Hyperlipidemia:Low fat diet discussed and encouraged.   Lipid Panel  Lab Results  Component Value Date   CHOL 123 12/18/2022   HDL 41 12/18/2022   LDLCALC 61 12/18/2022   LDLDIRECT 120.0 11/21/2020   TRIG 105 12/18/2022   CHOLHDL 3.0 12/18/2022     Updated lab needed at/ before next visit. 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

## 2023-08-30 NOTE — Assessment & Plan Note (Addendum)
 Kathryn Beck

## 2023-08-30 NOTE — Assessment & Plan Note (Signed)
 Bilateral lower ext amputee, limited mobility of both shoulders with weak upoper extremities, needs and will benefit from motorized wheelchair for use in her home, will permit independence and safety

## 2023-08-30 NOTE — Assessment & Plan Note (Signed)
 Needs to take medication aily as prescribed, therapy offered , no interest currently

## 2023-08-30 NOTE — Assessment & Plan Note (Signed)
 Controlled, no change in medication

## 2023-08-30 NOTE — Telephone Encounter (Signed)
 Patient would like surgery on her L hand, finger. Her daughter would like to schedule that. [phone]

## 2023-08-31 ENCOUNTER — Ambulatory Visit (INDEPENDENT_AMBULATORY_CARE_PROVIDER_SITE_OTHER)

## 2023-08-31 VITALS — BP 156/65 | HR 87 | Temp 98.3°F | Resp 18 | Wt 183.0 lb

## 2023-08-31 DIAGNOSIS — E785 Hyperlipidemia, unspecified: Secondary | ICD-10-CM | POA: Diagnosis not present

## 2023-08-31 DIAGNOSIS — I639 Cerebral infarction, unspecified: Secondary | ICD-10-CM

## 2023-08-31 MED ORDER — INCLISIRAN SODIUM 284 MG/1.5ML ~~LOC~~ SOSY
284.0000 mg | PREFILLED_SYRINGE | Freq: Once | SUBCUTANEOUS | Status: AC
  Administered 2023-08-31: 284 mg via SUBCUTANEOUS
  Filled 2023-08-31: qty 1.5

## 2023-08-31 NOTE — Progress Notes (Signed)
 Diagnosis: Hyperlipidemia  Provider:  Chilton Greathouse MD  Procedure: Injection  Leqvio (inclisiran), Dose: 284 mg, Site: subcutaneous, Number of injections: 1  Injection Site(s): Left lower quad. abdomen  Post Care: Patient declined observation  Discharge: Condition: Good, Destination: Home . AVS Provided  Performed by:  Loney Hering, LPN

## 2023-09-01 DIAGNOSIS — Z992 Dependence on renal dialysis: Secondary | ICD-10-CM | POA: Diagnosis not present

## 2023-09-01 DIAGNOSIS — N2581 Secondary hyperparathyroidism of renal origin: Secondary | ICD-10-CM | POA: Diagnosis not present

## 2023-09-01 DIAGNOSIS — N186 End stage renal disease: Secondary | ICD-10-CM | POA: Diagnosis not present

## 2023-09-02 ENCOUNTER — Ambulatory Visit: Payer: Medicare Other | Admitting: Internal Medicine

## 2023-09-02 DIAGNOSIS — H01002 Unspecified blepharitis right lower eyelid: Secondary | ICD-10-CM | POA: Diagnosis not present

## 2023-09-02 DIAGNOSIS — H01004 Unspecified blepharitis left upper eyelid: Secondary | ICD-10-CM | POA: Diagnosis not present

## 2023-09-02 DIAGNOSIS — H01001 Unspecified blepharitis right upper eyelid: Secondary | ICD-10-CM | POA: Diagnosis not present

## 2023-09-02 DIAGNOSIS — H1131 Conjunctival hemorrhage, right eye: Secondary | ICD-10-CM | POA: Diagnosis not present

## 2023-09-02 NOTE — Telephone Encounter (Signed)
 Please see messages below and advise.

## 2023-09-02 NOTE — Progress Notes (Deleted)
 Patient ID: Kathryn Beck, female   DOB: 05/06/1955, 68 y.o.   MRN: [id number]  HPI: Kathryn Beck is a 68 y.o.-year-old female, initially referred by her PCP, Dr. Rodolph Clap, returning for follow-up for of DM2, dx in 2008, insulin -dependent since ~2015, uncontrolled, with complications (ESRD, PN). She saw Dr. Monte Antonio - last OV with him 05/2016.  Last visit with me 4 months ago. She is here with her daughter.  History is obtained from patient along with daughter as patient is mostly nonverbal.  Interim history: She continues hemodialysis, previously on peritoneal dialysis.  She was previously on the kidney transplant but not anymore as she needs better control of hypertension and diabetes. She denies increased urination, nausea, chest pain.  Since last visit, she developed bilateral gangrene of fingers. Since last visit she was admitted with RSV 04/30/2023.  DM2: Reviewed HbA1c levels: Lab Results  Component Value Date   HGBA1C 8.4 (A) 04/20/2023   HGBA1C 7.7 (A) 12/15/2022   HGBA1C 6.3 (H) 07/07/2022   HGBA1C 9.3 (A) 04/16/2022   HGBA1C 8.9 (A) 10/28/2021   HGBA1C 9.1 (A) 07/22/2021   HGBA1C 11.4 (A) 05/02/2021   HGBA1C 9.1 (A) 10/28/2020   HGBA1C 8.4 (A) 06/18/2020   HGBA1C 7.2 (A) 02/28/2020   HGBA1C 7.8 07/17/2019   HGBA1C 8.7 (H) 04/02/2019   HGBA1C 7.9 (A) 01/31/2019   HGBA1C 6.8 (A) 08/31/2018   HGBA1C 6.9 (H) 06/09/2018   HGBA1C 8.4 (H) 03/08/2018   HGBA1C 8.3 (H) 11/12/2017   HGBA1C 11.0 (H) 08/18/2017   HGBA1C 11.2 06/18/2017   HGBA1C 11.5 03/19/2017  11/01/2020: HbA1c 9.3% 02/09/2020: HbA1c 6.8%  In the past, pt. was on a regimen of: - Humalog  75/25 20-26 units before each meal, 2-3X a day depending on the CBG before the meal  - Levemir  100 units at bedtime  At last visit she was on: - Toujeo  54 >>  was only taking 34 units in the evening -but out for 2 months - Humalog  >> on NovoLog  70/30 24-30 units 3-4x a day (!!!????) 24 units before a smaller meal 26-28 units  before a regular meal 30 units before a larger meal Take Humalog  EVERY TIME you eat, 15 min before the meal, if sugars are >70. - Humalog  sliding scale: - 150-175: + 1 unit  - 176-200: + 2 units  - 201-225: + 3 units  - 226-250: + 4 units  - 251-275: + 5 units - 276-300: + 6 units  Try not to correct sugars at bedtime unless they are >300 and in that case, take no more than 4 units.  Currently on: - Toujeo  40 >> 60 >> 80 units in the evening >> they decreased the dose to 40 units in am >> 30 units daily - Fiasp : up to 10 min before a meal: >> off this >> 10-20 units before meals At 5 units before a small meal FiAsp  Sliding scale: - 150-175: + 1 unit  - 176-200: + 2 units  - 201-225: + 3 units  - 226-250: + 4 units  - 251-275: + 5 units - 276-300: + 6 units  Pt was checking her sugars 1-4x a day -off the CGM: - am: 90s-150 - 2h after b'fast: n/c - lunch: 160-170 - 2h after lunch: n/c - dinner: 200-210 - 2h after dinner: - bedtime: 160-209  Previously:  Previously: 300s   Lowest sugar was 31 ... >> 40 >> ? In HD >> 80; it is unclear at which level she has  hypoglycemia awareness. She was in the emergency room with hypoglycemia on 11/20/2022 (while in HD - ? Value). Highest sugar was HI >> 400s >> 300s  Glucometer: Accuchek  Pt's meals are: - Breakfast: eggs, bacon, oatmeal, grits - Lunch: salads - Dinner: meat + veggie or sandwich + salad or nabs - Snacks: nabs, fruit cups, jello  -+ ESRD, on dialysis. On irbesartan . Last CMP:   Chemistry      Component Value Date/Time   NA 140 05/02/2023 0706   NA 142 07/07/2022 1140   K 4.8 05/02/2023 0706   CL 99 05/02/2023 0706   CO2 28 05/02/2023 0706   BUN 58 (H) 05/02/2023 0706   BUN 19 07/07/2022 1140   CREATININE 5.72 (H) 05/02/2023 0706   CREATININE 3.74 (H) 08/23/2017 1407   GLU 211 07/17/2019 1157      Component Value Date/Time   CALCIUM  10.1 05/02/2023 0706   CALCIUM  9.4 10/15/2017 1258   ALKPHOS 93  12/28/2022 0842   AST 17 12/28/2022 0842   ALT 42 12/28/2022 0842   BILITOT 0.6 12/28/2022 0842   BILITOT 0.3 12/15/2022 1340     -+ HL; last set of lipids: Lab Results  Component Value Date   CHOL 123 12/18/2022   HDL 41 12/18/2022   LDLCALC 61 12/18/2022   LDLDIRECT 120.0 11/21/2020   TRIG 105 12/18/2022   CHOLHDL 3.0 12/18/2022  Previously on Lipitor 40, now on Zetia  due to transaminitis with statins.  Leqvio  PA was recently approved.  - last eye exam was in 07/2021: no DR.  -No numbness but she has tingling in her feet.  She is on Neurontin  300 mg 3 times a day.  Last foot exam -08/06/2022-Dr. Clydia Dart. 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 She had a left foot ulcer and she ended up having left BKA 10/07/2022.  Adrenal masses.  Reviewed previous imaging reports: 08/23/2008: MRI of the abdomen with and without contrast: Left adrenal mass measuring 1.6 x 1.5 cm (previously measuring 1.4 x 1.6 cm on the CT scan from 2003 -stable in size),  Of note, she has an enlarging pancreatic head mass - pt reports a h/o pancreatitis.   10/10/2015: CT abdomen and pelvis without contrast: Stable 2.2 cm left adrenal nodule with Hounsfield unit measurements 28 unchanged likely a lipid poor adenoma  05/28/2017: MRI abd: Bilateral adrenal nodules with signal dropout on out of phase imaging, consistent with adenomas.   Example at 2.5 cm on the left and on the order of 1.6 cm on the right.  04/22/2018: MRI abd.: Stable 19 mm nodule in the left adrenal gland, unchanged since 2015, 19 mm otherwise negative.  Reviewed previous adrenal biochemical investigation: Dexamethasone  suppression test showed a low normal, nonsuppressed cortisol, but I suspect that this is due to her end-stage renal disease and dialysis status, rather than increased production. Component     Latest Ref Rng & Units 08/31/2018  Cortisol - AM     mcg/dL 6.1  Dexamethasone , Serum     ng/dL 045   A 40-JWJX urine cortisol  could not be done - on dialysis.  We checked a late-night salivary cortisol as this was elevated: Component     Latest Ref Rng & Units 12/26/2020 Draw date/time: 12/22/20  midnight   Salivary Cortisol Baseline     ug/dL 9.147  Salivary Cortisol 2nd Specimen     ug/dL Reference Range:  Children and Adults:  8:00a.m.:   0.025 - 0.600  Noon:      <  0.010 - 0.330  4:00p.m.:   0.010 - 0.200  Midnight:  <0.010 - 0.090  0.634   Previous investigation was negative for hyperaldosteronism or pheochromocytoma: Component     Latest Ref Rng 10/28/2021  Potassium     3.5 - 5.1 mEq/L 3.7   Metanephrine, Pl     <=57 pg/mL <25   Normetanephrine, Pl     <=148 pg/mL 74   Total Metanephrines-Plasma     <=205 pg/mL 74   Epinephrine      pg/mL 31   Norepinephrine     pg/mL 736   Dopamine     pg/mL <10   ALDOSTERONE CANCELED   Renin Activity     0.25 - 5.82 ng/mL/h 0.46   ALDO / PRA Ratio CANCELED   HbA1c, POC (controlled diabetic range)     0.0 - 7.0 %   Total Catecholamines     pg/mL 767    Component     Latest Ref Rng & Units 11/30/2017 10/04/2018 11/21/2020  Epinephrine      pg/mL Undetectable 24 <20  Norepinephrine     pg/mL 99 167 (L) 332  Dopamine     pg/mL Undetectable <10 <10  Catecholamines, Total     pg/mL 99    Total Catecholamines     pg/mL  191 (L) 332  Metanephrine, Pl     <=57 pg/mL <25 <25 <25  Normetanephrine, Pl     <=148 pg/mL 71 38 56  Total Metanephrines-Plasma     <=205 pg/mL 71 38 56  ALDOSTERONE      ng/dL 3 2 <1  Renin Activity     0.25 - 5.82 ng/mL/h 1.20 1.48 0.28  ALDO / PRA Ratio      2.5 1.4 see note  Potassium     3.5 - 5.1 mEq/L 4.0 4.9 4.1   Since last visit, we tried to recheck her salivary cortisol but this was canceled by the lab.  After this, we rechecked a cortisol and ACTH  and they returned elevated, with a normal DHEA-S:  Component     Latest Ref Rng 07/22/2021  Salivary Cortisol Baseline     ug/dL CANCELED    Cortisol, Plasma ug/dL  77.4   Comment: AM:  4.3 - 22.4 ug/dLPM:  3.1 - 16.7 ug/dL  Resulting Agency  [facility]     C206 ACTH  6 - 50 pg/mL 85 High    Comment: .  Reference range applies only to specimens collected  between 7am-10am.  .   Resulting Agency  [facility]   DHEA-Sulfate, LCMS ug/dL 91   Comment: This test was developed and its performance characteristics  determined by [facility]. It has not been cleared or approved  by the Food and Drug Administration.  Reference Range:  Adult Females (61 - 70y): <128   Resulting Agency  [facility]   At that point, I referred the patient for a pituitary MRI:  Pituitary MRI (10/02/2021): showed a microadenoma: Brain: Limited by motion degradation which is pervasive. Small pituitary gland with partially empty sella. There is a hypoenhancing nodule in the right aspect of the pituitary/sella measuring up to 3 mm on coronal postcontrast imaging, see series 26. Increased thickness along the right dorsum sella is ossification by 2019 CT. No infundibular or cavernous sinus disease.   IMPRESSION: 1. 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 Significant motion artifact. 2. Mild chronic small vessel disease.  At our visit from 10/2021, pituitary labs were at goal (with the exception  of a slightly low free T3): Component     Latest Ref Rng 10/28/2021  IGF-I, LC/MS     41 - 279 ng/mL 141   Z-Score (Female)     -2.0 - 2.0 SD 0.4   Triiodothyronine,Free,Serum     2.3 - 4.2 pg/mL 2.1 (L)   T4,Free(Direct)     0.60 - 1.60 ng/dL 9.14   Growth Hormone     < OR = 7.1 ng/mL 0.1   Prolactin     ng/mL 11.4   TSH     0.35 - 5.50 uIU/mL 0.91     Due to the possibility of Cushing's disease, I referred her to [facility].  She did not have this appointment yet, as it was rescheduled.  She also has HTN. She has been in the hospital repeatedly in the past for pancreatitis. She also has a history of MGUS, increased ferritin,  osteoporosis. She was in the emergency room 12/17/2022 for after a fall.  This happened after stopping Plavix  due to side effects.  CVA was suspected but was not seen on imaging (only an old stroke).  ROS: + See HPI  I reviewed pt's medications, allergies, PMH, social hx, family hx, and changes were documented in the history of present illness. Otherwise, unchanged from my initial visit note.  Past Medical History:  Diagnosis Date   Acid reflux    Amputated left leg (HCC)    Anemia    Arthritis    Axillary masses    Soft tissue - status post excision   Back pain    Cancer (HCC)    CHF (congestive heart failure) (HCC)    COVID-19 virus infection 04/06/2019   Depression    End-stage renal disease (HCC)    M/W/F dialysis   Essential hypertension    Headache    years ago   History of blood transfusion    History of cardiac catheterization    Normal coronary arteries October 2020   History of claustrophobia    History of pneumonia 2019   Hypoxia 04/03/2019   Memory loss    Mixed hyperlipidemia    Obesity    Pancreatitis    Peritoneal dialysis catheter in place [facility])    Pneumonia due to COVID-19 virus 04/02/2019   Sleep apnea    Noncompliant with CPAP   Stroke (HCC)    mini stroke   Type 2 diabetes mellitus (HCC)    Past Surgical History:  Procedure Laterality Date   ABDOMINAL AORTOGRAM W/LOWER EXTREMITY N/A 04/30/2022   Procedure: ABDOMINAL AORTOGRAM W/LOWER EXTREMITY;  Surgeon: Carlene Che, MD;  Location: [facility] IN[facility];  Service: Cardiovascular;  Laterality: N/A;   ABDOMINAL AORTOGRAM W/LOWER EXTREMITY N/A 07/21/2022   Procedure: ABDOMINAL AORTOGRAM W/LOWER EXTREMITY;  Surgeon: Margherita Shell, MD;  Location: [facility] INVAS[facility] CV [facility];  Service: Cardiovascular;  Laterality: N/A;   ABDOMINAL HYSTERECTOMY     ACHILLES TENDON LENGTHENING  08/15/2022   Procedure: ACHILLES TENDON LENGTHENING;  Surgeon: Floyce Hutching, DPM;  Location: [facility] OR;  Service: Podiatry;;    AMPUTATION Right 05/29/2022   Procedure: RIGHT BELOW THE KNEE AMPUTATION;  Surgeon: Timothy Ford, MD;  Location: [facility] OR;  Service: Orthopedics;  Laterality: Right;   AMPUTATION Left 09/04/2022   Procedure: AMPUTATION FOOT, serial irrigation;  Surgeon: Jennefer Moats, DPM;  Location: [facility] OR;  Service: Podiatry;  Laterality: Left;  Surgical team to do block   AMPUTATION Left 10/07/2022   Procedure: LEFT BELOW KNEE AMPUTATION;  Surgeon: Timothy Ford, MD;  Location[facility] OR;  Service: Orthopedics;  Laterality: Left;   AV FISTULA PLACEMENT Left 09/02/2017   Procedure: creation of left arm ARTERIOVENOUS (AV) FISTULA;  Surgeon: Margherita Shell, MD;  Location: [facility] OR;  Service: Vascular;  Laterality: Left;   COLONOSCOPY  2008   Dr. Nolene Baumgarten: normal    COLONOSCOPY N/A 12/18/2016   Dr. Nolene Baumgarten: multiple tubular adenomas, internal hemorrhoids. Surveillance in 3 years    ESOPHAGEAL DILATION N/A 10/13/2015   Procedure: ESOPHAGEAL DILATION;  Surgeon: Ruby Corporal, MD;  Location: [facility];  Service: Endoscopy;  Laterality: N/A;   ESOPHAGOGASTRODUODENOSCOPY N/A 10/13/2015   Dr. Homero Luster: chronic gastritis on path, no H.pylori. Empiric dilation    ESOPHAGOGASTRODUODENOSCOPY N/A 12/18/2016   Dr. Nolene Baumgarten: mild gastritis. BRAVO study revealed uncontrolled GERD. Dysphagia secondary to uncontrolled reflux   FOOT SURGERY Bilateral    nerve     LEFT HEART CATH AND CORONARY ANGIOGRAPHY N/A 12/29/2018   Procedure: LEFT HEART CATH AND CORONARY ANGIOGRAPHY;  Surgeon: Lucendia Rusk, MD;  Location: [facility] INVASIVE CV LAB;  Service: Cardiovascular;  Laterality: N/A;   LOWER EXTREMITY ANGIOGRAPHY Right 05/04/2022   Procedure: Lower Extremity Angiography;  Surgeon: Kayla Part, MD;  Location: [facility] INVASIVE CV LAB;  Service: Cardiovascular;  Laterality: Right;   LUNG BIOPSY     MASS EXCISION Right 01/09/2013   Procedure: EXCISION OF NEOPLASM OF RIGHT  AXILLA  AND EXCISION OF NEOPLASM OF LEFT AXILLA;  Surgeon: Beau Bound, MD;  Location: [facility] ORS;  Service: General;  Laterality: Right;  procedure end @ 08:23   MYRINGOTOMY WITH TUBE PLACEMENT Bilateral 04/28/2017   Procedure: BILATERAL MYRINGOTOMY WITH TUBE PLACEMENT;  Surgeon: Reynold Caves, MD;  Location: [facility] OR;  Service: ENT;  Laterality: Bilateral;   PERIPHERAL VASCULAR BALLOON ANGIOPLASTY Right 05/04/2022   Procedure: PERIPHERAL VASCULAR BALLOON ANGIOPLASTY;  Surgeon: Kayla Part, MD;  Location: [facility] INVASIVE CV LAB;  Service: Cardiovascular;  Laterality: Right;  PT   PERIPHERAL VASCULAR INTERVENTION Right 05/04/2022   Procedure: PERIPHERAL VASCULAR INTERVENTION;  Surgeon: Kayla Part, MD;  Location: [facility] INVASIVE CV LAB;  Service: Cardiovascular;  Laterality: Right;  SFA   PERIPHERAL VASCULAR INTERVENTION Left 07/21/2022   Procedure: PERIPHERAL VASCULAR INTERVENTION;  Surgeon: Margherita Shell, MD;  [facility]: [facility];  Service: Cardiovascular;  Laterality: Left;   REVISION OF ARTERIOVENOUS GORETEX GRAFT Left 05/04/2018   Procedure: TRANSPOSITION OF CEPHALIC VEIN ARTERIOVENOUS FISTULA LEFT ARM;  Surgeon: Mayo Speck, MD;  Location: [facility] OR;  Service: Vascular;  Laterality: Left;   SAVORY DILATION N/A 12/18/2016   Procedure: SAVORY DILATION;  Surgeon: Alyce Jubilee, MD;  Location: [facility];  Service: Endoscopy;  Laterality: N/A;   TRANSMETATARSAL AMPUTATION Left 08/15/2022   Procedure: TRANSMETATARSAL AMPUTATION;  Surgeon: Floyce Hutching, DPM;  Location: [facility] OR;  Service: Podiatry;  Laterality: Left;   Social History   Socioeconomic History   Marital status: Married    Spouse name: Not on file   Number of children: 2  Occupational History   CNA  Tobacco Use   Smoking status: Never Smoker   Smokeless tobacco: Never Used  Substance and Sexual Activity   Alcohol use: No   Drug use: No   Current Outpatient Medications on File Prior to Visit  Medication Sig Dispense Refill   acetaminophen  (TYLENOL  8 HOUR ARTHRITIS PAIN) 650 MG CR  tablet Take one tablet two times daily as needed, for pain 60 tablet  3   amLODipine  (NORVASC ) 10 MG tablet Take 1 tablet (10 mg total) by mouth daily. 90 tablet 1   aspirin  81 MG chewable tablet Chew 2 tablets (162 mg total) by mouth daily.     B Complex-C-Folic Acid  (RENA-VITE RX) 1 MG TABS Take 1 tablet by mouth daily.     busPIRone  (BUSPAR ) 5 MG tablet Take 1 tablet (5 mg total) by mouth 2 (two) times daily. 60 tablet 3   cinacalcet  (SENSIPAR ) 30 MG tablet Take 3 tablets (90 mg total) by mouth 3 (three) times a week. (Patient taking differently: Take 90 mg by mouth 3 (three) times a week. At dialysis MWF) 60 tablet    clopidogrel  (PLAVIX ) 75 MG tablet Take 1 tablet (75 mg total) by mouth daily. 30 tablet 11   DULoxetine  (CYMBALTA ) 60 MG capsule TAKE (1) CAPSULE BY MOUTH ONCE DAILY. 28 capsule 11   ezetimibe  (ZETIA ) 10 MG tablet Take 1 tablet (10 mg total) by mouth daily. 28 tablet 11   FIASP  FLEXTOUCH 100 UNIT/ML FlexTouch Pen INJECT 18 TO 30 UNITS INTO THE SKIN WITH BREAKFAST, WITH LUNCH, AND WITH EVENING MEAL (Patient taking differently: Inject 1-10 Units into the skin 3 (three) times daily. Sliding scale) 90 mL 3   gabapentin  (NEURONTIN ) 100 MG capsule Take 1 capsule (100 mg total) by mouth 3 (three) times daily. 90 capsule 3   glucose blood (ONETOUCH VERIO) test strip Use as instructed to check blood sugar 4X daily. 200 each 2   hydrALAZINE  (APRESOLINE ) 25 MG tablet Take 25 mg by mouth 2 (two) times daily.     insulin  glargine, 2 Unit Dial , (TOUJEO  MAX SOLOSTAR) 300 UNIT/ML Solostar Pen Inject 30 Units into the skin daily. May need to slowly increase the dose depending upon your blood sugar, follow-up with PCP 24 mL 1   meloxicam  (MOBIC ) 7.5 MG tablet Take 1 tablet (7.5 mg total) by mouth daily. 30 tablet 3   metoprolol  succinate (TOPROL -XL) 50 MG 24 hr tablet TAKE (1) TABLET BY MOUTH DAILY WITH FOOD *TAKE AFTER DIALYSIS* 28 tablet 11   ofloxacin  (FLOXIN ) 0.3 % OTIC solution PLACE 4 DROPS INTO  BOTH EARS TWICE DAILY FOR 28 DAYS     ofloxacin  (OCUFLOX ) 0.3 % ophthalmic solution Place 1 drop into the right eye 4 (four) times daily. 5 mL 0   oxyCODONE -acetaminophen  (PERCOCET/ROXICET) 5-325 MG tablet Take 1 tablet by mouth every 8 (eight) hours as needed. 20 tablet 0   torsemide  (DEMADEX ) 100 MG tablet Take 100 mg by mouth See admin instructions. Take in Tues,Thursday,Sat & Sun     [name]  500 MG chewable tablet Chew 1-2 tablets (500-1,000 mg total) by mouth See admin instructions. Take 1000mg  (2 tablets) by mouth with meals and 500mg  (1 tablet) with snacks 90 tablet 0   [DISCONTINUED] FLUoxetine (PROZAC) 10 MG capsule Take 10 mg by mouth daily.       [DISCONTINUED] glipiZIDE  (GLUCOTROL ) 10 MG tablet Take 10 mg by mouth 2 (two) times daily before a meal.       Current Facility-Administered Medications on File Prior to Visit  Medication Dose Route Frequency Provider Last Rate Last Admin   0.9 %  sodium chloride  infusion   Intravenous Continuous Lockamy, Randi L, NP-C   Stopped at 10/07/22 1422   Allergies  Allergen Reactions   Ace Inhibitors Anaphylaxis and Swelling   Penicillins Itching and Swelling   Statins Other (See Comments)    elevated LFT's     Albuterol  Swelling  Family History  Problem Relation Age of Onset   Hypertension Father    Hypercholesterolemia Father    Arthritis Father    Hypertension Sister    Hypercholesterolemia Sister    Breast cancer Sister    Hypertension Sister    Colon cancer Neg Hx    Colon polyps Neg Hx    PE: There were no vitals taken for this visit. Pt in wheelchair - could not be weighed Wt Readings from Last 3 Encounters:  08/31/23 183 lb (83 kg)  08/20/23 180 lb (81.6 kg)  08/11/23 167 lb 15.9 oz (76.2 kg)   Constitutional: overweight, in NAD, moon facies, truncal obesity Eyes: EOMI, no exophthalmos ENT: no thyromegaly, no cervical lymphadenopathy Cardiovascular: RRR, No RG, +1/6 SEM Respiratory: CTA B Musculoskeletal: +  Bilateral BKA Skin: No rashes Neurological: + Intermittent flapping tremor with outstretched hands  ASSESSMENT: 1. DM2, insulin -dependent, uncontrolled, with complications - CKD stage 4 - PN  2. HL  3. B adrenal adenomas  4.  Hypercortisolism  5. Pituitary microadenoma  PLAN:  1.  Complex patient with longstanding, uncontrolled, type 2 diabetes, previously on a premixed insulin  regimen but now on basal-bolus insulin  regimen with initially improved control but worsening HbA1c at last visit.  At that time, HbA1c was 8.4%, increased from 7.7%.  I again recommended a CGM at that time and also recommended to take Fiasp  before small meals or snacks, as sugars were mostly at goal in the morning but they were increasing throughout the day in a stepwise fashion.  She was not always getting the Fiasp  insulin  with meals as she had smaller meals.  We previously discussed about holding Fiasp  before breakfast in the days that she had dialysis (MWF at 6:30 AM).  - I suggested to:  Patient Instructions  Please continue: - Toujeo  30 units in the evening - Fiasp :  - 10-20 units before meals (may skip Fiasp  with b'fast before dialysis) For a small meal/snack, please take 5 units of Fiasp  Use the following FiAsp  Sliding scale: - 150-175: + 1 unit  - 176-200: + 2 units  - 201-225: + 3 units  - 226-250: + 4 units  - 251-275: + 5 units - 276-300: + 6 units   Please return in 3-4 months.      - we checked her HbA1c: 7%  - advised to check sugars at different times of the day - 4x a day, rotating check times - advised for yearly eye exams >> she is UTD - return to clinic in 3-4 months  2. HL - Latest lipid panel was reviewed from 11/2022: At goal Lab Results  Component Value Date   CHOL 123 12/18/2022   HDL 41 12/18/2022   LDLCALC 61 12/18/2022   LDLDIRECT 120.0 11/21/2020   TRIG 105 12/18/2022   CHOLHDL 3.0 12/18/2022  -She could not use statins due to transaminitis.  She is on Zetia  10  mg daily tolerating it well  3.  Bilateral adrenal adenoma -Patient with history of bilateral adrenal adenomas per review of her abdominal MRI from 2019.  Imaging characteristics pointed towards benign tumors, of 2.5 and 1.6 cm, respectively.  However, they have appeared to increase over time.  Reviewing the MRI of the abdomen from 2010 and the CT of the abdomen and pelvis from 2017, only 1 nodule was seen in both, in the left adrenal.  She had another MRI in 03/2018 which showed a stable left 1.9 cm adrenal nodule. -We investigated her adrenal status.  Plasma catecholamines, metanephrines, and aldosterone  were normal between 2019-2022.  Norepinephrine was slightly elevated (nonspecific) 09/2018.  Plasma catecholamines, metanephrines, aldosterone, and renin activity were all normal. -Investigation of her cortisol status is difficult due to being on dialysis.  These patients usually have higher cortisol levels due to increased inflammation but not necessarily increased cortisol production.  However, repeated investigation for this returned positive. -These patients usually have higher cortisol level due to increased inflammation but not necessarily increased cortisol production.  Repeated investigation for this returned positive  4.  Hypercortisolism -Investigation for autonomous cortisol production was positive: She had a nonsuppressible cortisol level by dexamethasone  suppression test, high salivary cortisol and an elevated serum cortisol along with an elevated ACTH  level and normal DHEA-S -These results pointed towards a central etiology for her hypercortisolism, therefore, we checked a pituitary MRI which was positive for a pituitary microadenoma -Therefore, it is possible that patient may have a non-hormone producing adenoma or Cushing's disease.  To determine between the 2, she may need to have an inferior petrosal sinus sampling test, which is not available here.  I referred her to [facility].  She did  not have this appointment yet.  5.  Pituitary microadenoma -Please see above -After ACTH  and cortisol returned elevated, we checked a pituitary MRI (10/02/2021).  This revealed a possible 3 mm pituitary mass. -We checked her pituitary hormones and they were normal with exception of a slightly low free T3 -It is possible that the adenoma is the source of her cortisol production, however, she would need petrosal sinus sampling before possible surgery.  She is waiting for an appointment at [facility]. 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 - She previously had a pituitary MRI in the hospital but this was degraded by motion artifact and it was not with pituitary protocol.  Will try to get another MRI now.  Emilie Harden, MD PhD [facility] Endocrinology

## 2023-09-03 ENCOUNTER — Ambulatory Visit: Payer: Self-pay | Admitting: Neurology

## 2023-09-03 DIAGNOSIS — N186 End stage renal disease: Secondary | ICD-10-CM | POA: Diagnosis not present

## 2023-09-03 DIAGNOSIS — G5603 Carpal tunnel syndrome, bilateral upper limbs: Secondary | ICD-10-CM

## 2023-09-03 DIAGNOSIS — N2581 Secondary hyperparathyroidism of renal origin: Secondary | ICD-10-CM | POA: Diagnosis not present

## 2023-09-03 DIAGNOSIS — Z992 Dependence on renal dialysis: Secondary | ICD-10-CM | POA: Diagnosis not present

## 2023-09-04 DIAGNOSIS — S88912A Complete traumatic amputation of left lower leg, level unspecified, initial encounter: Secondary | ICD-10-CM | POA: Diagnosis not present

## 2023-09-04 DIAGNOSIS — I96 Gangrene, not elsewhere classified: Secondary | ICD-10-CM | POA: Diagnosis not present

## 2023-09-04 DIAGNOSIS — Z89511 Acquired absence of right leg below knee: Secondary | ICD-10-CM | POA: Diagnosis not present

## 2023-09-06 ENCOUNTER — Telehealth: Payer: Self-pay | Admitting: Neurology

## 2023-09-06 DIAGNOSIS — N186 End stage renal disease: Secondary | ICD-10-CM | POA: Diagnosis not present

## 2023-09-06 DIAGNOSIS — Z992 Dependence on renal dialysis: Secondary | ICD-10-CM | POA: Diagnosis not present

## 2023-09-06 DIAGNOSIS — N2581 Secondary hyperparathyroidism of renal origin: Secondary | ICD-10-CM | POA: Diagnosis not present

## 2023-09-06 NOTE — Telephone Encounter (Signed)
 Referral for Hand surgery faxed [facility] for Bilateral carpal tunnel syndrome     [facility]  Phone#[phone] Fax#[phone]

## 2023-09-06 NOTE — Telephone Encounter (Signed)
 I called and advised of message below. Pt has an appt on 09/14/2023 will discuss she is have some minor c/o pain but pt's daughter believes that it is fear of becoming septic. I advised any temp, N/V/D go to the ER but if she remains stable without complication we will see her on 09/14/2023. Will call with any changes or if she has any other questions.

## 2023-09-07 ENCOUNTER — Other Ambulatory Visit: Payer: Self-pay | Admitting: Licensed Clinical Social Worker

## 2023-09-07 NOTE — Patient Instructions (Signed)
 Visit Information  Thank you for taking time to visit with me today. Please don't hesitate to contact me if I can be of assistance to you before our next scheduled appointment.  Your next care management appointment is by telephone on 09/09/2023 at 1pm with [facility]  Telephone follow up appointment date/time:  09/21/2023 with SW 10am  Please call the care guide team at [phone] if you need to cancel, schedule, or reschedule an appointment.   Please call 911 if you are experiencing a Mental Health or Behavioral Health Crisis or need someone to talk to.  Fletcher Humble MSW, LCSW Licensed Clinical Social Worker  [facility], Population Health Direct Dial : [phone]  Fax: [phone]

## 2023-09-07 NOTE — Patient Outreach (Signed)
 Complex Care Management   Visit Note  09/07/2023  Name:  Kathryn Beck MRN: [id number] DOB: 11/09/1955  Situation: Referral received for Complex Care Management related to Diabetes with Complications I obtained verbal consent from Patient.  Visit completed with Kathryn Beck and pt daughter Kathryn Beck  on the phone. Kathryn Beck reports sadness due to hand pain caused by carpal tunnel. LCSW A. Gisella Alwine advised Kathryn Beck and daughter to contact pcp if hand pain worsens.   Background:   Past Medical History:  Diagnosis Date   Acid reflux    Amputated left leg (HCC)    Anemia    Arthritis    Axillary masses    Soft tissue - status post excision   Back pain    Cancer (HCC)    CHF (congestive heart failure) (HCC)    COVID-19 virus infection 04/06/2019   Depression    End-stage renal disease (HCC)    M/W/F dialysis   Essential hypertension    Headache    years ago   History of blood transfusion    History of cardiac catheterization    Normal coronary arteries October 2020   History of claustrophobia    History of pneumonia 2019   Hypoxia 04/03/2019   Memory loss    Mixed hyperlipidemia    Obesity    Pancreatitis    Peritoneal dialysis catheter in place [facility])    Pneumonia due to COVID-19 virus 04/02/2019   Sleep apnea    Noncompliant with CPAP   Stroke (HCC)    mini stroke   Type 2 diabetes mellitus (HCC)     Assessment: Patient Reported Symptoms:  Cognitive Cognitive Status: Able to follow simple commands, Alert and oriented to person, place, and time, Normal speech and language skills Cognitive/Intellectual Conditions Management [RPT]: None reported or documented in medical history or problem list      Neurological Neurological Review of Symptoms: No symptoms reported    HEENT HEENT Symptoms Reported: No symptoms reported      Cardiovascular Cardiovascular Symptoms Reported: No symptoms reported Does patient have uncontrolled Hypertension?: No Cardiovascular  Conditions: Hypertension, Heart failure  Respiratory Respiratory Symptoms Reported: No symptoms reported    Endocrine Is patient diabetic?: Yes Endocrine Conditions: Diabetes  Gastrointestinal Gastrointestinal Symptoms Reported: Irritable bowel syndrome      Genitourinary   Genitourinary Conditions: Chronic kidney disease  Integumentary Integumentary Symptoms Reported: No symptoms reported    Musculoskeletal Musculoskelatal Symptoms Reviewed: No symptoms reported   Falls in the past year?: Yes Number of falls in past year: 2 or more Was there an injury with Fall?: No Fall Risk Category Calculator: 2 Patient Fall Risk Level: Moderate Fall Risk    Psychosocial Psychosocial Symptoms Reported: Other Other Psychosocial Conditions: sadness Behavioral Management Strategies: Coping strategies Major Change/Loss/Stressor/Fears (CP): Medical condition, self Quality of Family Relationships: helpful, involved, supportive Do you feel physically threatened by others?: No      08/26/2023    9:16 AM  Depression screen PHQ 2/9  Decreased Interest 0  Down, Depressed, Hopeless 2  PHQ - 2 Score 2  Altered sleeping 3  Tired, decreased energy 3  Change in appetite 3  Feeling bad or failure about yourself  3  Trouble concentrating 3  Moving slowly or fidgety/restless 3  Suicidal thoughts 0  PHQ-9 Score 20  Difficult doing work/chores Somewhat difficult   Flowsheet Row Office Visit from 08/26/2023 in [facility]  PHQ-9 Total Score 20    There were no  vitals filed for this visit.  Medications Reviewed Today     Reviewed by Fletcher Humble, LCSW (Social Worker) on 09/07/23 at [phone]  Med List Status: <None>   Medication Order Taking? Sig Documenting Provider Last Dose Status Informant  0.9 %  sodium chloride  infusion [id number]   Lockamy, Randi L, NP-C  Active   acetaminophen  (TYLENOL  8 HOUR ARTHRITIS PAIN) 650 MG CR tablet [id number] Yes Take one tablet two times daily  as needed, for pain Towanda Fret, MD  Active   amLODipine  (NORVASC ) 10 MG tablet [id number] Yes Take 1 tablet (10 mg total) by mouth daily. Clearnce Curia, NP  Active Self  aspirin  81 MG chewable tablet [id number] Yes Chew 2 tablets (162 mg total) by mouth daily. Rayfield Cairo, MD  Active Self           Med Note Placido Brightly   Tue Jul 27, 2023 10:53 AM) Pts daughter states she's taking 1 tablet   Kathryn Complex-C-Folic Acid  (RENA-VITE RX) 1 MG TABS [id number] Yes Take 1 tablet by mouth daily. [provider]  Active Self           Med Note Broadus Canes, DAWN S   Thu Nov 19, 2022 11:34 AM)    busPIRone  (BUSPAR ) 5 MG tablet [id number] Yes Take 1 tablet (5 mg total) by mouth 2 (two) times daily. Towanda Fret, MD  Active   cinacalcet  (SENSIPAR ) 30 MG tablet [id number]  Take 3 tablets (90 mg total) by mouth 3 (three) times a week.  Patient taking differently: Take 90 mg by mouth 3 (three) times a week. At dialysis MWF   Sheikh, Omair Latif, DO  Active Self           Med Note Wallie Gums   Sat May 01, 2023 12:25 PM)    clopidogrel  (PLAVIX ) 75 MG tablet [id number] Yes Take 1 tablet (75 mg total) by mouth daily. Cassandra Cleveland, MD  Active Self  DULoxetine  (CYMBALTA ) 60 MG capsule [id number] Yes TAKE (1) CAPSULE BY MOUTH ONCE DAILY. Towanda Fret, MD  Active Self  ezetimibe  (ZETIA ) 10 MG tablet [id number] Yes Take 1 tablet (10 mg total) by mouth daily. Towanda Fret, MD  Active Self  FIASP  FLEXTOUCH 100 UNIT/ML FlexTouch Pen [id number]  INJECT 18 TO 30 UNITS INTO THE SKIN WITH BREAKFAST, WITH LUNCH, AND WITH EVENING MEAL  Patient taking differently: Inject 1-10 Units into the skin 3 (three) times daily. Sliding scale   Emilie Harden, MD  Active Self           Med Note Antonio Klinefelter Dec 17, 2022  6:21 PM)      Discontinued 05/28/11 1302 (Patient has not taken in last 30 days)   gabapentin  (NEURONTIN ) 100 MG capsule [id number]  Take 1 capsule (100 mg total)  by mouth 3 (three) times daily. Cassandra Cleveland, MD  Expired 08/06/23 2359     Discontinued 05/28/11 1302 (Change in therapy)   glucose blood (ONETOUCH VERIO) test strip [id number] Yes Use as instructed to check blood sugar 4X daily. Emilie Harden, MD  Active Self  hydrALAZINE  (APRESOLINE ) 25 MG tablet [id number] Yes Take 25 mg by mouth 2 (two) times daily. [provider]  Active Self  insulin  glargine, 2 Unit Dial , (TOUJEO  MAX SOLOSTAR) 300 UNIT/ML Solostar Pen [id number] Yes Inject 30 Units into the skin daily. May need to slowly increase the dose depending upon your blood sugar, follow-up with PCP Emilie Harden, MD  Active Self  meloxicam  (MOBIC ) 7.5 MG tablet [id number] Yes Take 1 tablet (7.5 mg total) by mouth daily. Towanda Fret, MD  Active   metoprolol  succinate (TOPROL -XL) 50 MG 24 hr tablet [id number] Yes TAKE (1) TABLET BY MOUTH DAILY WITH FOOD *TAKE AFTER DIALYSISTowanda Fret, MD  Active Self  ofloxacin  (FLOXIN ) 0.3 % OTIC solution [id number] Yes PLACE 4 DROPS INTO BOTH EARS TWICE DAILY FOR 28 DAYS [provider]  Active   ofloxacin  (OCUFLOX ) 0.3 % ophthalmic solution [id number]  Place 1 drop into the right eye 4 (four) times daily. Early Glisson, MD  Active   oxyCODONE -acetaminophen  (PERCOCET/ROXICET) 5-325 MG tablet [id number]  Take 1 tablet by mouth every 8 (eight) hours as needed. Timothy Ford, MD  Active   torsemide  (DEMADEX ) 100 MG tablet [id number] Yes Take 100 mg by mouth See admin instructions. Take in Tues,Thursday,Sat & Sun [provider]  Active Self  [name]  500 MG chewable tablet [id number] Yes Chew 1-2 tablets (500-1,000 mg total) by mouth See admin instructions. Take 1000mg  (2 tablets) by mouth with meals and 500mg  (1 tablet) with snacks Towanda Fret, MD  Active   Med List Note (Ashe, Dana K, CPhT 07/18/22 1635): Patient has dialysis on MWF            Recommendation:   Continue Current Plan of Care Engage in self  care techniques, communicate needs with daughter and support team  Follow Up Plan:   Telephone follow up appointment date/time:  09/21/2023  SIG Fletcher Humble MSW, LCSW Licensed Clinical Social Worker  [facility], Population Health Direct Dial : [phone]  Fax: [phone]

## 2023-09-08 ENCOUNTER — Ambulatory Visit (INDEPENDENT_AMBULATORY_CARE_PROVIDER_SITE_OTHER): Admitting: Physician Assistant

## 2023-09-08 ENCOUNTER — Encounter: Payer: Self-pay | Admitting: Family

## 2023-09-08 DIAGNOSIS — I96 Gangrene, not elsewhere classified: Secondary | ICD-10-CM | POA: Diagnosis not present

## 2023-09-08 DIAGNOSIS — N186 End stage renal disease: Secondary | ICD-10-CM | POA: Diagnosis not present

## 2023-09-08 DIAGNOSIS — Z992 Dependence on renal dialysis: Secondary | ICD-10-CM | POA: Diagnosis not present

## 2023-09-08 DIAGNOSIS — N2581 Secondary hyperparathyroidism of renal origin: Secondary | ICD-10-CM | POA: Diagnosis not present

## 2023-09-08 DIAGNOSIS — L089 Local infection of the skin and subcutaneous tissue, unspecified: Secondary | ICD-10-CM

## 2023-09-08 MED ORDER — DOXYCYCLINE HYCLATE 100 MG PO TABS: 100.0000 mg | ORAL_TABLET | Freq: Two times a day (BID) | ORAL | 0 refills | Status: DC

## 2023-09-08 MED ORDER — OXYCODONE-ACETAMINOPHEN 5-325 MG PO TABS: 1.0000 | ORAL_TABLET | Freq: Four times a day (QID) | ORAL | 0 refills | Status: DC | PRN

## 2023-09-08 NOTE — Progress Notes (Signed)
 Office Visit Note   Patient: Kathryn Beck           Date of Birth: 12-21-1955           MRN: [id number] Visit Date: 09/08/2023              Requested by: Towanda Fret, MD [address], [address] [place],  [place] [address] PCP: Towanda Fret, MD  No chief complaint on file.     HPI: 68 y/o female here for follow up with history of gangrenous ulceration tip of the long finger bilaterally.  She initially had purulent drainage from the left long finger. She has taken oral antibiotics in the past with some success and resolved purulent drainage.    She reports increased pain on the left > right middle finger.  She is on HD and has a fistula on the left UE as well as diabetes.  The fistula has been working well for > 2 years per the patient.    Assessment & Plan: Visit Diagnoses:  1. 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 Finger infection   I do not think this is steal for the fistula on the left UE.  It appears to be microvascular disease.  She has good doppler flow in the radial/ulnar/palmer arch.  Plan: We placed her  Doxycycline  for 2 weeks and she will follow up for exam in 1 week to discuss proceeding with left middle finger verse continued conservative therapy.    Follow-Up Instructions: No follow-ups on file.   Ortho Exam  Patient is alert, oriented, no adenopathy, well-dressed, normal affect, normal respiratory effort. She has good doppler flow in the radial, ulnar and palmer arches B UE.   Palpable good thrill in the fistula.   She has black dry gangrene in the tip of the left middle finger with erythema proximal and mild edema. Cap refill of the palm and adjacent finger is brisk.   The right middle finger is stable without black eschar/gangrene.  Non tender.      Imaging: No results found.      Labs: Lab Results  Component Value Date   HGBA1C 8.4 (A) 04/20/2023   HGBA1C 7.7 (A) 12/15/2022   HGBA1C 6.3 (H) 07/07/2022   ESRSEDRATE 70 (H)  09/03/2022   ESRSEDRATE 79 (H) 08/13/2022   ESRSEDRATE 37 (H) 04/25/2022   CRP 0.9 09/03/2022   CRP 9.1 (H) 08/13/2022   CRP 29.4 (H) 04/25/2022   LABURIC 1.8 (L) 06/07/2018   LABURIC 6.2 04/22/2018   LABURIC 9.3 (H) 10/07/2015   REPTSTATUS 09/09/2022 FINAL 09/04/2022   GRAMSTAIN  09/04/2022    RARE WBC PRESENT, PREDOMINANTLY PMN NO ORGANISMS SEEN    CULT  09/04/2022    RARE ENTEROBACTER HORMAECHEI NO ANAEROBES ISOLATED Performed at [facility], [address], [place], [place] [address]    LABORGA ENTEROBACTER HORMAECHEI 09/04/2022     Lab Results  Component Value Date   ALBUMIN  3.1 (L) 12/28/2022   ALBUMIN  3.2 (L) 12/17/2022   ALBUMIN  3.8 (L) 12/15/2022   PREALBUMIN 26 08/13/2022   PREALBUMIN 24 04/25/2022    Lab Results  Component Value Date   MG 2.2 12/28/2022   MG 2.1 11/19/2022   MG 1.9 09/08/2022   Lab Results  Component Value Date   VD25OH 30.9 09/28/2017   VD25OH 15 (L) 10/07/2015   VD25OH 16 (L) 04/25/2015    Lab Results  Component Value Date   PREALBUMIN  26 08/13/2022   PREALBUMIN 24 04/25/2022      Latest Ref Rng & Units 05/02/2023    5:06 AM 04/30/2023    1:30 PM 12/28/2022    8:42 AM  CBC EXTENDED  WBC 4.0 - 10.5 K/uL 4.9  5.4  11.6   RBC 3.87 - 5.11 MIL/uL 4.49  4.36  4.52   Hemoglobin 12.0 - 15.0 g/dL 40.9  81.1  91.4   HCT 36.0 - 46.0 % 37.8  35.9  36.2   Platelets 150 - 400 K/uL 264  162  262   NEUT# 1.7 - 7.7 K/uL 3.9  3.7  10.2   Lymph# 0.7 - 4.0 K/uL 0.7  1.2  0.8      There is no height or weight on file to calculate BMI.  Orders:  No orders of the defined types were placed in this encounter.  Meds ordered this encounter  Medications   doxycycline  (VIBRA -TABS) 100 MG tablet    Sig: Take 1 tablet (100 mg total) by mouth 2 (two) times daily.    Dispense:  28 tablet    Refill:  0    Supervising Provider:   DUDA, MARCUS V [[id number]]   oxyCODONE -acetaminophen  (PERCOCET/ROXICET) 5-325 MG tablet    Sig: Take 1 tablet by mouth  every 6 (six) hours as needed.    Dispense:  20 tablet    Refill:  0    Supervising Provider:   DUDA, MARCUS V [[id number]]     Procedures: No procedures performed  Clinical Data: No additional findings.  ROS:  All other systems negative, except as noted in the HPI. Review of Systems  Objective: Vital Signs: There were no vitals taken for this visit.  Specialty Comments:  No specialty comments available.  PMFS History: Patient Active Problem List   Diagnosis Date Noted   Mass of breast 08/30/2023   Depression, major, single episode, moderate (HCC) 08/30/2023   GAD (generalized anxiety disorder) 08/30/2023   Help needed for obtaining assistive device 08/30/2023   Decreased strength, endurance, and mobility 08/30/2023   Gangrene (HCC) 08/30/2023   Uncontrolled type 2 diabetes mellitus with hyperglycemia (HCC) 08/30/2023   Diarrhea 05/04/2023   RSV (acute bronchiolitis due to respiratory syncytial virus) 04/30/2023   Hearing loss 04/27/2023   Otalgia of both ears 04/27/2023   Volume overload 12/28/2022   Fall at home 12/18/2022   CVA (cerebral vascular accident) (HCC) 12/17/2022   Acute confusion 12/10/2022   Poor appetite 12/10/2022   Chills 12/10/2022   Generalized weakness 11/18/2022   Paronychia of finger of right hand 11/13/2022   Dehiscence of amputation stump of left lower extremity (HCC) 10/07/2022   Status post below-knee amputation of left lower extremity (HCC) 10/07/2022   Subacute osteomyelitis, left ankle and foot (HCC) 09/03/2022   Left foot infection 09/03/2022   Amputated left leg (HCC) 09/03/2022   Wound infection after surgery 09/03/2022   Equinus contracture of left ankle 08/15/2022   Acute osteomyelitis of toe of left foot (HCC) 08/13/2022   Peripheral vascular complication 07/21/2022   PVD (peripheral vascular disease) (HCC) 07/18/2022   Toe ulcer, left, limited to breakdown of skin (HCC) 07/13/2022   S/P BKA (below knee amputation), right (HCC)  05/29/2022   Confusion 05/19/2022   Visual hallucinations 05/19/2022   Watery eyes 05/19/2022   Requires assistance with activities of daily living (ADL) 04/26/2022   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   Gangrene of right foot (HCC) 04/25/2022   PAD (peripheral  artery disease) (HCC) 04/25/2022   Other osteoporosis without current pathological fracture 11/10/2021   Hypocalcemia 11/10/2021   Fall 11/02/2021   Left foot pain 10/31/2021   Low back pain with left-sided sciatica 10/31/2021   Left ear impacted cerumen 09/18/2021   Chronic left shoulder pain 06/10/2021   Abnormal CXR 03/10/2021   Ear pain, bilateral 03/10/2021   Morbid obesity (HCC) 03/10/2021   Subungual hematoma of second toe of left foot 10/28/2020   Insomnia 10/28/2020   Memory loss or impairment 09/26/2020   Forgetfulness 09/26/2020   Recurrent vertigo 09/26/2020   Abnormal MRI, cervical spine 03/04/2020   Right leg weakness 02/28/2020   Knee pain, right 10/23/2019   Cough 08/09/2019   Carpal tunnel syndrome 06/13/2019   Chronic pain syndrome 06/13/2019   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) 06/13/2019   Encounter for support and coordination of transition of care 04/14/2019   Neck pain 03/23/2019   Near syncope 12/05/2018   ESRD on hemodialysis (HCC) 11/22/2018   Ischemic heart disease 09/25/2018   Not currently working due to disabled status 06/13/2018   HTN (hypertension) 06/12/2018   Depression 03/09/2018   Adrenal mass, left (HCC) 11/09/2017   MGUS (monoclonal gammopathy of unknown significance) 11/05/2017   Shoulder pain 11/01/2017   Chronic diastolic (congestive) heart failure (HCC) 09/20/2017   Bilateral leg weakness 09/09/2017   Adrenal adenoma 09/03/2017   Unsteady gait 07/11/2017   Recurrent falls 07/11/2017   Anemia of chronic disease 03/24/2017   History of colonic polyps 02/10/2017   Dysphagia 11/05/2016   Irritable bowel syndrome 02/04/2016    Hospital discharge follow-up 10/27/2015   Intolerant of heat 10/07/2014   Cardiac murmur 01/17/2014   Back pain with left-sided radiculopathy 09/18/2013   Seasonal allergies 03/10/2013   Lipoma of back 12/22/2012   Other seasonal allergic rhinitis 08/22/2012   Bronchitis 12/01/2011   OSA (obstructive sleep apnea) 06/02/2011   Chronic kidney disease, stage 4 (severe) (HCC) 01/13/2011   Neuropathy 04/16/2010   Malaise and fatigue 07/24/2009   Personal history of other diseases of the nervous system and sense organs 05/08/2009   LUPUS ERYTHEMATOSUS, DISCOID 06/05/2008   Arm pain 05/30/2008   Hyperlipidemia 06/07/2007   Obesity 06/07/2007   Malignant hypertension 06/07/2007   Type 2 diabetes mellitus with hyperglycemia (HCC) 06/07/2007   Diabetes mellitus (HCC) 06/07/2007   Past Medical History:  Diagnosis Date   Acid reflux    Amputated left leg (HCC)    Anemia    Arthritis    Axillary masses    Soft tissue - status post excision   Back pain    Cancer (HCC)    CHF (congestive heart failure) (HCC)    COVID-19 virus infection 04/06/2019   Depression    End-stage renal disease (HCC)    M/W/F dialysis   Essential hypertension    Headache    years ago   History of blood transfusion    History of cardiac catheterization    Normal coronary arteries October 2020   History of claustrophobia    History of pneumonia 2019   Hypoxia 04/03/2019   Memory loss    Mixed hyperlipidemia    Obesity    Pancreatitis    Peritoneal dialysis catheter in place [facility])    Pneumonia due to COVID-19 virus 04/02/2019   Sleep apnea    Noncompliant with CPAP   Stroke (HCC)    mini stroke   Type 2 diabetes mellitus (HCC)     Family  History  Problem Relation Age of Onset   Hypertension Father    Hypercholesterolemia Father    Arthritis Father    Hypertension Sister    Hypercholesterolemia Sister    Breast cancer Sister    Hypertension Sister    Colon cancer Neg Hx    Colon polyps Neg Hx      Past Surgical History:  Procedure Laterality Date   ABDOMINAL AORTOGRAM W/LOWER EXTREMITY N/A 04/30/2022   Procedure: ABDOMINAL AORTOGRAM W/LOWER EXTREMITY;  Surgeon: Carlene Che, MD;  Location: [facility] INVASIVE CV LAB;  Service: Cardiovascular;  Laterality: N/A;   ABDOMINAL AORTOGRAM W/LOWER EXTREMITY N/A 07/21/2022   Procedure: ABDOMINAL AORTOGRAM W/LOWER EXTREMITY;  Surgeon: Margherita Shell, MD;  Location: [facility] CV LAB;  Service: Cardiovascular;  Laterality: N/A;   ABDOMINAL HYSTERECTOMY     ACHILLES TENDON LENGTHENING  08/15/2022   Procedure: ACHILLES TENDON LENGTHENING;  Surgeon: Floyce Hutching, DPM;  Location: [facility] OR;  Service: Podiatry;;   AMPUTATION Right 05/29/2022   Procedure: RIGHT BELOW THE KNEE AMPUTATION;  Surgeon: Timothy Ford, MD;  Location: [facility] OR;  Service: Orthopedics;  Laterality: Right;   AMPUTATION Left 09/04/2022   Procedure: AMPUTATION FOOT, serial irrigation;  Surgeon: Jennefer Moats, DPM;  Location: [facility] OR;  Service: Podiatry;  Laterality: Left;  Surgical team to do block   AMPUTATION Left 10/07/2022   Procedure: LEFT BELOW KNEE AMPUTATION;  Surgeon: Timothy Ford, MD;  Location: [facility] OR;  Service: Orthopedics;  Laterality: Left;   AV FISTULA PLACEMENT Left 09/02/2017   Procedure: creation of left arm ARTERIOVENOUS (AV) FISTULA;  Surgeon: Margherita Shell, MD;  Location: [facility] OR;  Service: Vascular;  Laterality: Left;   COLONOSCOPY  2008   Dr. Nolene Baumgarten: normal    COLONOSCOPY N/A 12/18/2016   Dr. Nolene Baumgarten: multiple tubular adenomas, internal hemorrhoids. Surveillance in 3 years    ESOPHAGEAL DILATION N/A 10/13/2015   Procedure: ESOPHAGEAL DILATION;  Surgeon: Ruby Corporal, MD;  Location: [facility];  Service: Endoscopy;  Laterality: N/A;   ESOPHAGOGASTRODUODENOSCOPY N/A 10/13/2015   Dr. Homero Luster: chronic gastritis on path, no H.pylori. Empiric dilation    ESOPHAGOGASTRODUODENOSCOPY N/A 12/18/2016   Dr. Nolene Baumgarten: mild gastritis. BRAVO study revealed uncontrolled GERD.  Dysphagia secondary to uncontrolled reflux   FOOT SURGERY Bilateral    nerve     LEFT HEART CATH AND CORONARY ANGIOGRAPHY N/A 12/29/2018   Procedure: LEFT HEART CATH AND CORONARY ANGIOGRAPHY;  Surgeon: Lucendia Rusk, MD;  Location: [facility] INVASIVE CV LAB;  Service: Cardiovascular;  Laterality: N/A;   LOWER EXTREMITY ANGIOGRAPHY Right 05/04/2022   Procedure: Lower Extremity Angiography;  Surgeon: Kayla Part, MD;  Location: [facility] INVASIVE CV LAB;  Service: Cardiovascular;  Laterality: Right;   LUNG BIOPSY     MASS EXCISION Right 01/09/2013   Procedure: EXCISION OF NEOPLASM OF RIGHT  AXILLA  AND EXCISION OF NEOPLASM OF LEFT AXILLA;  Surgeon: Beau Bound, MD;  Location: [facility] ORS;  Service: General;  Laterality: Right;  procedure end @ 08:23   MYRINGOTOMY WITH TUBE PLACEMENT Bilateral 04/28/2017   Procedure: BILATERAL MYRINGOTOMY WITH TUBE PLACEMENT;  Surgeon: Reynold Caves, MD;  Location: [facility] OR;  Service: ENT;  Laterality: Bilateral;   PERIPHERAL VASCULAR BALLOON ANGIOPLASTY Right 05/04/2022   Procedure: PERIPHERAL VASCULAR BALLOON ANGIOPLASTY;  Surgeon: Kayla Part, MD;  Location: [facility] INVASIVE CV LAB;  Service: Cardiovascular;  Laterality: Right;  PT   PERIPHERAL VASCULAR INTERVENTION Right 05/04/2022   Procedure: PERIPHERAL VASCULAR INTERVENTION;  Surgeon: Irvin Mantel  E, MD;  Location: [facility];  Service: Cardiovascular;  Laterality: Right;  SFA   PERIPHERAL VASCULAR INTERVENTION Left 07/21/2022   Procedure: PERIPHERAL VASCULAR INTERVENTION;  Surgeon: Margherita Shell, MD;  Location: [facility];  Service: Cardiovascular;  Laterality: Left;   REVISION OF ARTERIOVENOUS GORETEX GRAFT Left 05/04/2018   Procedure: TRANSPOSITION OF CEPHALIC VEIN ARTERIOVENOUS FISTULA LEFT ARM;  Surgeon: Mayo Speck, MD;  Location: [facility] OR;  Service: Vascular;  Laterality: Left;   SAVORY DILATION N/A 12/18/2016   Procedure: SAVORY DILATION;  Surgeon: Alyce Jubilee, MD;  [facility];   Service: Endoscopy;  Laterality: N/A;   TRANSMETATARSAL AMPUTATION Left 08/15/2022   Procedure: TRANSMETATARSAL AMPUTATION;  Surgeon: Floyce Hutching, DPM;  Location: [facility] OR;  Service: Podiatry;  Laterality: Left;   Social History   Occupational History   Occupation: retired   Tobacco Use   Smoking status: Never    Passive exposure: Never   Smokeless tobacco: Never   Tobacco comments:    Verified by Daughter, [name]  Vaping Use   Vaping status: Never Used  Substance and Sexual Activity   Alcohol use: No   Drug use: No   Sexual activity: Not Currently    Partners: Male

## 2023-09-09 ENCOUNTER — Encounter: Payer: Self-pay | Admitting: *Deleted

## 2023-09-09 ENCOUNTER — Telehealth: Payer: Self-pay | Admitting: *Deleted

## 2023-09-10 DIAGNOSIS — N2581 Secondary hyperparathyroidism of renal origin: Secondary | ICD-10-CM | POA: Diagnosis not present

## 2023-09-10 DIAGNOSIS — Z992 Dependence on renal dialysis: Secondary | ICD-10-CM | POA: Diagnosis not present

## 2023-09-10 DIAGNOSIS — N186 End stage renal disease: Secondary | ICD-10-CM | POA: Diagnosis not present

## 2023-09-13 DIAGNOSIS — Z992 Dependence on renal dialysis: Secondary | ICD-10-CM | POA: Diagnosis not present

## 2023-09-13 DIAGNOSIS — N186 End stage renal disease: Secondary | ICD-10-CM | POA: Diagnosis not present

## 2023-09-13 DIAGNOSIS — N2581 Secondary hyperparathyroidism of renal origin: Secondary | ICD-10-CM | POA: Diagnosis not present

## 2023-09-14 ENCOUNTER — Ambulatory Visit: Admitting: Orthopedic Surgery

## 2023-09-14 ENCOUNTER — Encounter (HOSPITAL_BASED_OUTPATIENT_CLINIC_OR_DEPARTMENT_OTHER): Admitting: Cardiovascular Disease

## 2023-09-15 DIAGNOSIS — Z992 Dependence on renal dialysis: Secondary | ICD-10-CM | POA: Diagnosis not present

## 2023-09-15 DIAGNOSIS — N2581 Secondary hyperparathyroidism of renal origin: Secondary | ICD-10-CM | POA: Diagnosis not present

## 2023-09-15 DIAGNOSIS — N186 End stage renal disease: Secondary | ICD-10-CM | POA: Diagnosis not present

## 2023-09-16 ENCOUNTER — Ambulatory Visit: Admitting: Orthopedic Surgery

## 2023-09-16 ENCOUNTER — Encounter: Payer: Self-pay | Admitting: Orthopedic Surgery

## 2023-09-16 ENCOUNTER — Telehealth: Payer: Self-pay | Admitting: Orthopedic Surgery

## 2023-09-16 DIAGNOSIS — I96 Gangrene, not elsewhere classified: Secondary | ICD-10-CM

## 2023-09-16 MED ORDER — ASPIRIN 81 MG PO CHEW: 162.0000 mg | CHEWABLE_TABLET | Freq: Every day | ORAL | 1 refills | Status: DC

## 2023-09-16 NOTE — Progress Notes (Signed)
 Office Visit Note   Patient: Kathryn Beck           Date of Birth: [birth year]           MRN: [id number] Visit Date: 09/16/2023              Requested by: Antonetta Rollene BRAVO, MD [address] [place],  [place] [address] PCP: Antonetta Rollene BRAVO, MD  Chief Complaint  Patient presents with   Right Hand - Wound Check   Left Hand - Wound Check      HPI: Patient is a 68 year old woman who presents in follow-up for gangrene of the long finger bilateral hands.  Patient states that the left finger is extremely painful at this time.  She states she does not have pain in the right long finger.  Patient states that her current wheelchair is broken and unsafe to use.  Assessment & Plan: Visit Diagnoses:  1. 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  Risk and benefits were discussed including risk of the wound not healing need for additional surgery.  Patient states she understands wished to proceed at this time.  Patient is also provided a prescription for a new wheelchair.  Follow-Up Instructions: Return in about 2 weeks (around 09/30/2023).   Ortho Exam  Patient is alert, oriented, no adenopathy, well-dressed, normal affect, normal respiratory effort. Examination patient has gangrenous changes of the left long finger up to the PIP joint.  The remainder of the finger is dark and ischemic.  Her finger is tender to palpation.  There is no cellulitis or drainage.  Examination right hand there is gangrenous changes of the tip of the long finger no ascending cellulitis no ascending gangrenous changes.    Imaging: No results found. No images are attached to the encounter.  Labs: Lab Results  Component Value Date   HGBA1C 8.4 (A) 04/20/2023   HGBA1C 7.7 (A) 12/15/2022   HGBA1C 6.3 (H) 07/07/2022   ESRSEDRATE 70 (H) 09/03/2022   ESRSEDRATE 79 (H) 08/13/2022   ESRSEDRATE 37 (H) 04/25/2022    CRP 0.9 09/03/2022   CRP 9.1 (H) 08/13/2022   CRP 29.4 (H) 04/25/2022   LABURIC 1.8 (L) 06/07/2018   LABURIC 6.2 04/22/2018   LABURIC 9.3 (H) 10/07/2015   REPTSTATUS 09/09/2022 FINAL 09/04/2022   GRAMSTAIN  09/04/2022    RARE WBC PRESENT, PREDOMINANTLY PMN NO ORGANISMS SEEN    CULT  09/04/2022    RARE ENTEROBACTER HORMAECHEI NO ANAEROBES ISOLATED Performed at [facility], [address] [place], [place] [address]    LABORGA ENTEROBACTER HORMAECHEI 09/04/2022     Lab Results  Component Value Date   ALBUMIN  3.1 (L) 12/28/2022   ALBUMIN  3.2 (L) 12/17/2022   ALBUMIN  3.8 (L) 12/15/2022   PREALBUMIN 26 08/13/2022   PREALBUMIN 24 04/25/2022    Lab Results  Component Value Date   MG 2.2 12/28/2022   MG 2.1 11/19/2022   MG 1.9 09/08/2022   Lab Results  Component Value Date   VD25OH 30.9 09/28/2017   VD25OH 15 (L) 10/07/2015   VD25OH 16 (L) 04/25/2015    Lab Results  Component Value Date   PREALBUMIN 26 08/13/2022   PREALBUMIN 24 04/25/2022      Latest Ref Rng & Units 05/02/2023    5:06 AM 04/30/2023    1:30 PM 12/28/2022  8:42 AM  CBC EXTENDED  WBC 4.0 - 10.5 K/uL 4.9  5.4  11.6   RBC 3.87 - 5.11 MIL/uL 4.49  4.36  4.52   Hemoglobin 12.0 - 15.0 g/dL 88.4  88.7  89.1   HCT 36.0 - 46.0 % 37.8  35.9  36.2   Platelets 150 - 400 K/uL 264  162  262   NEUT# 1.7 - 7.7 K/uL 3.9  3.7  10.2   Lymph# 0.7 - 4.0 K/uL 0.7  1.2  0.8      There is no height or weight on file to calculate BMI.  Orders:  No orders of the defined types were placed in this encounter.  No orders of the defined types were placed in this encounter.    Procedures: No procedures performed  Clinical Data: No additional findings.  ROS:  All other systems negative, except as noted in the HPI. Review of Systems  Objective: Vital Signs: There were no vitals taken for this visit.  Specialty Comments:  No specialty comments available.  PMFS History: Patient Active Problem List    Diagnosis Date Noted   Mass of breast 08/30/2023   Depression, major, single episode, moderate (HCC) 08/30/2023   GAD (generalized anxiety disorder) 08/30/2023   Help needed for obtaining assistive device 08/30/2023   Decreased strength, endurance, and mobility 08/30/2023   Gangrene (HCC) 08/30/2023   Uncontrolled type 2 diabetes mellitus with hyperglycemia (HCC) 08/30/2023   Diarrhea 05/04/2023   RSV (acute bronchiolitis due to respiratory syncytial virus) 04/30/2023   Hearing loss 04/27/2023   Otalgia of both ears 04/27/2023   Volume overload 12/28/2022   Fall at home 12/18/2022   CVA (cerebral vascular accident) (HCC) 12/17/2022   Acute confusion 12/10/2022   Poor appetite 12/10/2022   Chills 12/10/2022   Generalized weakness 11/18/2022   Paronychia of finger of right hand 11/13/2022   Dehiscence of amputation stump of left lower extremity (HCC) 10/07/2022   Status post below-knee amputation of left lower extremity (HCC) 10/07/2022   Subacute osteomyelitis, left ankle and foot (HCC) 09/03/2022   Left foot infection 09/03/2022   Amputated left leg (HCC) 09/03/2022   Wound infection after surgery 09/03/2022   Equinus contracture of left ankle 08/15/2022   Acute osteomyelitis of toe of left foot (HCC) 08/13/2022   Peripheral vascular complication 07/21/2022   PVD (peripheral vascular disease) (HCC) 07/18/2022   Toe ulcer, left, limited to breakdown of skin (HCC) 07/13/2022   S/P BKA (below knee amputation), right (HCC) 05/29/2022   Confusion 05/19/2022   Visual hallucinations 05/19/2022   Watery eyes 05/19/2022   Requires assistance with activities of daily living (ADL) 04/26/2022   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   Gangrene of right foot (HCC) 04/25/2022   PAD (peripheral artery disease) (HCC) 04/25/2022   Other osteoporosis without current pathological fracture 11/10/2021   Hypocalcemia 11/10/2021   Fall 11/02/2021   Left foot pain 10/31/2021   Low  back pain with left-sided sciatica 10/31/2021   Left ear impacted cerumen 09/18/2021   Chronic left shoulder pain 06/10/2021   Abnormal CXR 03/10/2021   Ear pain, bilateral 03/10/2021   Morbid obesity (HCC) 03/10/2021   Subungual hematoma of second toe of left foot 10/28/2020   Insomnia 10/28/2020   Memory loss or impairment 09/26/2020   Forgetfulness 09/26/2020   Recurrent vertigo 09/26/2020   Abnormal MRI, cervical spine 03/04/2020   Right leg weakness 02/28/2020   Knee pain, right 10/23/2019   Cough 08/09/2019  Carpal tunnel syndrome 06/13/2019   Chronic pain syndrome 06/13/2019   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) 06/13/2019   Encounter for support and coordination of transition of care 04/14/2019   Neck pain 03/23/2019   Near syncope 12/05/2018   ESRD on hemodialysis (HCC) 11/22/2018   Ischemic heart disease 09/25/2018   Not currently working due to disabled status 06/13/2018   HTN (hypertension) 06/12/2018   Depression 03/09/2018   Adrenal mass, left (HCC) 11/09/2017   MGUS (monoclonal gammopathy of unknown significance) 11/05/2017   Shoulder pain 11/01/2017   Chronic diastolic (congestive) heart failure (HCC) 09/20/2017   Bilateral leg weakness 09/09/2017   Adrenal adenoma 09/03/2017   Unsteady gait 07/11/2017   Recurrent falls 07/11/2017   Anemia of chronic disease 03/24/2017   History of colonic polyps 02/10/2017   Dysphagia 11/05/2016   Irritable bowel syndrome 02/04/2016   Hospital discharge follow-up 10/27/2015   Intolerant of heat 10/07/2014   Cardiac murmur 01/17/2014   Back pain with left-sided radiculopathy 09/18/2013   Seasonal allergies 03/10/2013   Lipoma of back 12/22/2012   Other seasonal allergic rhinitis 08/22/2012   Bronchitis 12/01/2011   OSA (obstructive sleep apnea) 06/02/2011   Chronic kidney disease, stage 4 (severe) (HCC) 01/13/2011   Neuropathy 04/16/2010   Malaise and fatigue 07/24/2009   Personal history of  other diseases of the nervous system and sense organs 05/08/2009   LUPUS ERYTHEMATOSUS, DISCOID 06/05/2008   Arm pain 05/30/2008   Hyperlipidemia 06/07/2007   Obesity 06/07/2007   Malignant hypertension 06/07/2007   Type 2 diabetes mellitus with hyperglycemia (HCC) 06/07/2007   Diabetes mellitus (HCC) 06/07/2007   Past Medical History:  Diagnosis Date   Acid reflux    Amputated left leg (HCC)    Anemia    Arthritis    Axillary masses    Soft tissue - status post excision   Back pain    Cancer (HCC)    CHF (congestive heart failure) (HCC)    COVID-19 virus infection 04/06/2019   Depression    End-stage renal disease (HCC)    M/W/F dialysis   Essential hypertension    Headache    years ago   History of blood transfusion    History of cardiac catheterization    Normal coronary arteries October 2020   History of claustrophobia    History of pneumonia 2019   Hypoxia 04/03/2019   Memory loss    Mixed hyperlipidemia    Obesity    Pancreatitis    Peritoneal dialysis catheter in place [facility])    Pneumonia due to COVID-19 virus 04/02/2019   Sleep apnea    Noncompliant with CPAP   Stroke (HCC)    mini stroke   Type 2 diabetes mellitus (HCC)     Family History  Problem Relation Age of Onset   Hypertension Father    Hypercholesterolemia Father    Arthritis Father    Hypertension Sister    Hypercholesterolemia Sister    Breast cancer Sister    Hypertension Sister    Colon cancer Neg Hx    Colon polyps Neg Hx     Past Surgical History:  Procedure Laterality Date   ABDOMINAL AORTOGRAM W/LOWER EXTREMITY N/A 04/30/2022   Procedure: ABDOMINAL AORTOGRAM W/LOWER EXTREMITY;  Surgeon: Magda Debby SAILOR, MD;  Location: [facility];  Service: Cardiovascular;  Laterality: N/A;   ABDOMINAL AORTOGRAM W/LOWER EXTREMITY N/A 07/21/2022   Procedure: ABDOMINAL AORTOGRAM W/LOWER EXTREMITY;  Surgeon: Serene Gaile ORN, MD;  Location:  [facility];  Service: Cardiovascular;   Laterality: N/A;   ABDOMINAL HYSTERECTOMY     ACHILLES TENDON LENGTHENING  08/15/2022   Procedure: ACHILLES TENDON LENGTHENING;  Surgeon: Silva Juliene SAUNDERS, DPM;  Location: [facility] OR;  Service: Podiatry;;   AMPUTATION Right 05/29/2022   Procedure: RIGHT BELOW THE KNEE AMPUTATION;  Surgeon: Harden Jerona GAILS, MD;  Location: [facility] OR;  Service: Orthopedics;  Laterality: Right;   AMPUTATION Left 09/04/2022   Procedure: AMPUTATION FOOT, serial irrigation;  Surgeon: Joya Stabs, DPM;  Location: [facility] OR;  Service: Podiatry;  Laterality: Left;  Surgical team to do block   AMPUTATION Left 10/07/2022   Procedure: LEFT BELOW KNEE AMPUTATION;  Surgeon: Harden Jerona GAILS, MD;  Location: [facility] OR;  Service: Orthopedics;  Laterality: Left;   AV FISTULA PLACEMENT Left 09/02/2017   Procedure: creation of left arm ARTERIOVENOUS (AV) FISTULA;  Surgeon: Serene Gaile ORN, MD;  Location: [facility] OR;  Service: Vascular;  Laterality: Left;   COLONOSCOPY  2008   Dr. Harvey: normal    COLONOSCOPY N/A 12/18/2016   Dr. Harvey: multiple tubular adenomas, internal hemorrhoids. Surveillance in 3 years    ESOPHAGEAL DILATION N/A 10/13/2015   Procedure: ESOPHAGEAL DILATION;  Surgeon: Claudis RAYMOND Rivet, MD;  Location: [facility];  Service: Endoscopy;  Laterality: N/A;   ESOPHAGOGASTRODUODENOSCOPY N/A 10/13/2015   Dr. Rivet: chronic gastritis on path, no H.pylori. Empiric dilation    ESOPHAGOGASTRODUODENOSCOPY N/A 12/18/2016   Dr. Harvey: mild gastritis. BRAVO study revealed uncontrolled GERD. Dysphagia secondary to uncontrolled reflux   FOOT SURGERY Bilateral    nerve     LEFT HEART CATH AND CORONARY ANGIOGRAPHY N/A 12/29/2018   Procedure: LEFT HEART CATH AND CORONARY ANGIOGRAPHY;  Surgeon: Dann Candyce RAMAN, MD;  Location: [facility] INVASIVE CV LAB;  Service: Cardiovascular;  Laterality: N/A;   LOWER EXTREMITY ANGIOGRAPHY Right 05/04/2022   Procedure: Lower Extremity Angiography;  Surgeon: Lanis Fonda BRAVO, MD;  Location: [facility] CV LAB;  Service:  Cardiovascular;  Laterality: Right;   LUNG BIOPSY     MASS EXCISION Right 01/09/2013   Procedure: EXCISION OF NEOPLASM OF RIGHT  AXILLA  AND EXCISION OF NEOPLASM OF LEFT AXILLA;  Surgeon: Oneil DELENA Budge, MD;  Location: [facility];  Service: General;  Laterality: Right;  procedure end @ 08:23   MYRINGOTOMY WITH TUBE PLACEMENT Bilateral 04/28/2017   Procedure: BILATERAL MYRINGOTOMY WITH TUBE PLACEMENT;  Surgeon: Karis Clunes, MD;  Location: [facility] OR;  Service: ENT;  Laterality: Bilateral;   PERIPHERAL VASCULAR BALLOON ANGIOPLASTY Right 05/04/2022   Procedure: PERIPHERAL VASCULAR BALLOON ANGIOPLASTY;  Surgeon: Lanis Fonda BRAVO, MD;  Location: [facility] INVASIVE CV LAB;  Service: Cardiovascular;  Laterality: Right;  PT   PERIPHERAL VASCULAR INTERVENTION Right 05/04/2022   Procedure: PERIPHERAL VASCULAR INTERVENTION;  Surgeon: Lanis Fonda BRAVO, MD;  Location: [facility] INVASIVE CV LAB;  Service: Cardiovascular;  Laterality: Right;  SFA   PERIPHERAL VASCULAR INTERVENTION Left 07/21/2022   Procedure: PERIPHERAL VASCULAR INTERVENTION;  Surgeon: Serene Gaile ORN, MD;  Location: [facility];  Service: Cardiovascular;  Laterality: Left;   REVISION OF ARTERIOVENOUS GORETEX GRAFT Left 05/04/2018   Procedure: TRANSPOSITION OF CEPHALIC VEIN ARTERIOVENOUS FISTULA LEFT ARM;  Surgeon: Oris Krystal FALCON, MD;  Location: [facility] OR;  Service: Vascular;  Laterality: Left;   SAVORY DILATION N/A 12/18/2016   Procedure: SAVORY DILATION;  Surgeon: Harvey Margo CROME, MD;  Location: [facility];  Service: Endoscopy;  Laterality: N/A;   TRANSMETATARSAL AMPUTATION Left 08/15/2022   Procedure: TRANSMETATARSAL AMPUTATION;  Surgeon: Silva Juliene SAUNDERS, DPM;  Location: [facility] OR;  Service: Podiatry;  Laterality: Left;   Social History   Occupational History   Occupation: retired   Tobacco Use   Smoking status: Never    Passive exposure: Never   Smokeless tobacco: Never   Tobacco comments:    Verified by Daughter, [name]  Vaping Use   Vaping status:  Never Used  Substance and Sexual Activity   Alcohol use: No   Drug use: No   Sexual activity: Not Currently    Partners: Male

## 2023-09-16 NOTE — Telephone Encounter (Signed)
 Pt's daughter Augusto asking for a call ASAP about mom surgery date.Pt seen Harden today about surgery. 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 Please call [name] at [phone]

## 2023-09-17 DIAGNOSIS — N186 End stage renal disease: Secondary | ICD-10-CM | POA: Diagnosis not present

## 2023-09-17 DIAGNOSIS — N2581 Secondary hyperparathyroidism of renal origin: Secondary | ICD-10-CM | POA: Diagnosis not present

## 2023-09-17 DIAGNOSIS — Z992 Dependence on renal dialysis: Secondary | ICD-10-CM | POA: Diagnosis not present

## 2023-09-20 DIAGNOSIS — N186 End stage renal disease: Secondary | ICD-10-CM | POA: Diagnosis not present

## 2023-09-20 DIAGNOSIS — N2581 Secondary hyperparathyroidism of renal origin: Secondary | ICD-10-CM | POA: Diagnosis not present

## 2023-09-20 DIAGNOSIS — Z992 Dependence on renal dialysis: Secondary | ICD-10-CM | POA: Diagnosis not present

## 2023-09-21 ENCOUNTER — Ambulatory Visit: Admitting: Internal Medicine

## 2023-09-21 ENCOUNTER — Ambulatory Visit: Admitting: Adult Health

## 2023-09-21 ENCOUNTER — Other Ambulatory Visit: Payer: Self-pay | Admitting: Licensed Clinical Social Worker

## 2023-09-21 NOTE — Progress Notes (Deleted)
 Patient ID: [name], female   DOB: 12-30-1955, 68 y.o.   MRN: [id number] This note was precharted 09/02/2023.  HPI: Kathryn Beck is a 68 y.o.-year-old female, initially referred by her PCP, Dr. Antonetta, returning for follow-up for of DM2, dx in 2008, insulin -dependent since ~2015, uncontrolled, with complications (ESRD, PN). She saw Dr. Lenis - last OV with him 05/2016.  Last visit with me 5 months ago. She is here with her daughter.  History is obtained from patient along with daughter as patient is mostly nonverbal.  Interim history: She continues hemodialysis, previously on peritoneal dialysis.  She was previously on the kidney transplant but not anymore as she needs better control of hypertension and diabetes. She denies increased urination, nausea, chest pain.  Since last visit, she developed bilateral gangrene of fingers.  She will have finger amputation on 09/29/2023. Since last visit she was admitted with RSV 04/30/2023.  DM2: Reviewed HbA1c levels: Lab Results  Component Value Date   HGBA1C 8.4 (A) 04/20/2023   HGBA1C 7.7 (A) 12/15/2022   HGBA1C 6.3 (H) 07/07/2022   HGBA1C 9.3 (A) 04/16/2022   HGBA1C 8.9 (A) 10/28/2021   HGBA1C 9.1 (A) 07/22/2021   HGBA1C 11.4 (A) 05/02/2021   HGBA1C 9.1 (A) 10/28/2020   HGBA1C 8.4 (A) 06/18/2020   HGBA1C 7.2 (A) 02/28/2020   HGBA1C 7.8 07/17/2019   HGBA1C 8.7 (H) 04/02/2019   HGBA1C 7.9 (A) 01/31/2019   HGBA1C 6.8 (A) 08/31/2018   HGBA1C 6.9 (H) 06/09/2018   HGBA1C 8.4 (H) 03/08/2018   HGBA1C 8.3 (H) 11/12/2017   HGBA1C 11.0 (H) 08/18/2017   HGBA1C 11.2 06/18/2017   HGBA1C 11.5 03/19/2017  11/01/2020: HbA1c 9.3% 02/09/2020: HbA1c 6.8%  In the past, pt. was on a regimen of: - Humalog  75/25 20-26 units before each meal, 2-3X a day depending on the CBG before the meal  - Levemir  100 units at bedtime  At last visit she was on: - Toujeo  54 >>  was only taking 34 units in the evening -but out for 2 months - Humalog  >> on NovoLog   70/30 24-30 units 3-4x a day (!!!????) 24 units before a smaller meal 26-28 units before a regular meal 30 units before a larger meal Take Humalog  EVERY TIME you eat, 15 min before the meal, if sugars are >70. - Humalog  sliding scale: - 150-175: + 1 unit  - 176-200: + 2 units  - 201-225: + 3 units  - 226-250: + 4 units  - 251-275: + 5 units - 276-300: + 6 units  Try not to correct sugars at bedtime unless they are >300 and in that case, take no more than 4 units.  Currently on: - Toujeo  40 >> 60 >> 80 units in the evening >> they decreased the dose to 40 units in am >> 30 units daily - Fiasp : up to 10 min before a meal: >> off this >> 10-20 units before meals At 5 units before a small meal FiAsp  Sliding scale: - 150-175: + 1 unit  - 176-200: + 2 units  - 201-225: + 3 units  - 226-250: + 4 units  - 251-275: + 5 units - 276-300: + 6 units  Pt was checking her sugars 1-4x a day -off the CGM: - am: 90s-150 - 2h after b'fast: n/c - lunch: 160-170 - 2h after lunch: n/c - dinner: 200-210 - 2h after dinner: - bedtime: 160-209  Previously:  Previously: 300s   Lowest sugar was 31 ... >> 40 >> ?  In HD >> 80; it is unclear at which level she has hypoglycemia awareness. She was in the emergency room with hypoglycemia on 11/20/2022 (while in HD - ? Value). Highest sugar was HI >> 400s >> 300s  Glucometer: Accuchek  Pt's meals are: - Breakfast: eggs, bacon, oatmeal, grits - Lunch: salads - Dinner: meat + veggie or sandwich + salad or nabs - Snacks: nabs, fruit cups, jello  -+ ESRD, on dialysis. On irbesartan . Last CMP:   Chemistry      Component Value Date/Time   NA 140 05/02/2023 0706   NA 142 07/07/2022 1140   K 4.8 05/02/2023 0706   CL 99 05/02/2023 0706   CO2 28 05/02/2023 0706   BUN 58 (H) 05/02/2023 0706   BUN 19 07/07/2022 1140   CREATININE 5.72 (H) 05/02/2023 0706   CREATININE 3.74 (H) 08/23/2017 1407   GLU 211 07/17/2019 1157      Component Value Date/Time    CALCIUM  10.1 05/02/2023 0706   CALCIUM  9.4 10/15/2017 1258   ALKPHOS 93 12/28/2022 0842   AST 17 12/28/2022 0842   ALT 42 12/28/2022 0842   BILITOT 0.6 12/28/2022 0842   BILITOT 0.3 12/15/2022 1340     -+ HL; last set of lipids: Lab Results  Component Value Date   CHOL 123 12/18/2022   HDL 41 12/18/2022   LDLCALC 61 12/18/2022   LDLDIRECT 120.0 11/21/2020   TRIG 105 12/18/2022   CHOLHDL 3.0 12/18/2022  Previously on Lipitor 40, now on Zetia  due to transaminitis with statins.  Leqvio  PA was recently approved.  - last eye exam was in 07/2021: no DR.  -No numbness but she has tingling in her feet.  She is on Neurontin  300 mg 3 times a day.  Last foot exam -08/06/2022-Dr. Gershon. 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 She had a left foot ulcer and she ended up having left 10/07/2022.  Adrenal masses.  Reviewed previous imaging reports: 08/23/2008: MRI of the abdomen with and without contrast: Left adrenal mass measuring 1.6 x 1.5 cm (previously measuring 1.4 x 1.6 cm on the CT scan from 2003 -stable in size),  Of note, she has an enlarging pancreatic head mass - pt reports a h/o pancreatitis.   10/10/2015: CT abdomen and pelvis without contrast: Stable 2.2 cm left adrenal nodule with Hounsfield unit measurements 28 unchanged likely a lipid poor adenoma  05/28/2017: MRI abd: Bilateral adrenal nodules with signal dropout on out of phase imaging, consistent with adenomas.   Example at 2.5 cm on the left and on the order of 1.6 cm on the right.  04/22/2018: MRI abd.: Stable 19 mm nodule in the left adrenal gland, unchanged since 2015, 19 mm otherwise negative.  Reviewed previous adrenal biochemical investigation: Dexamethasone  suppression test showed a low normal, nonsuppressed cortisol, but I suspect that this is due to her end-stage renal disease and dialysis status, rather than increased production. Component     Latest Ref Rng & Units 08/31/2018  Cortisol - AM     mcg/dL  6.1  Dexamethasone , Serum     ng/dL 553   A 75-ynlm urine cortisol could not be done - on dialysis.  We checked a late-night salivary cortisol as this was elevated: Component     Latest Ref Rng & Units 12/26/2020 Draw date/time: 12/22/20  midnight   Salivary Cortisol Baseline     ug/dL 9.362  Salivary Cortisol 2nd Specimen     ug/dL Reference Range:  Children and Adults:  8:00a.m.:   0.025 - 0.600  Noon:      <0.010 - 0.330  4:00p.m.:   0.010 - 0.200  Midnight:  <0.010 - 0.090  0.634   Previous investigation was negative for hyperaldosteronism or pheochromocytoma: Component     Latest Ref Rng 10/28/2021  Potassium     3.5 - 5.1 mEq/L 3.7   Metanephrine, Pl     <=57 pg/mL <25   Normetanephrine, Pl     <=148 pg/mL 74   Total Metanephrines-Plasma     <=205 pg/mL 74   Epinephrine      pg/mL 31   Norepinephrine     pg/mL 736   Dopamine     pg/mL <10   ALDOSTERONE CANCELED   Renin Activity     0.25 - 5.82 ng/mL/h 0.46   ALDO / PRA Ratio CANCELED   HbA1c, POC (controlled diabetic range)     0.0 - 7.0 %   Total Catecholamines     pg/mL 767    Component     Latest Ref Rng & Units 11/30/2017 10/04/2018 11/21/2020  Epinephrine      pg/mL Undetectable 24 <20  Norepinephrine     pg/mL 99 167 (L) 332  Dopamine     pg/mL Undetectable <10 <10  Catecholamines, Total     pg/mL 99    Total Catecholamines     pg/mL  191 (L) 332  Metanephrine, Pl     <=57 pg/mL <25 <25 <25  Normetanephrine, Pl     <=148 pg/mL 71 38 56  Total Metanephrines-Plasma     <=205 pg/mL 71 38 56  ALDOSTERONE      ng/dL 3 2 <1  Renin Activity     0.25 - 5.82 ng/mL/h 1.20 1.48 0.28  ALDO / PRA Ratio      2.5 1.4 see note  Potassium     3.5 - 5.1 mEq/L 4.0 4.9 4.1   Since last visit, we tried to recheck her salivary cortisol but this was canceled by the lab.  After this, we rechecked a cortisol and ACTH  and they returned elevated, with a normal DHEA-S:  Component     Latest Ref Rng 07/22/2021   Salivary Cortisol Baseline     ug/dL CANCELED    Cortisol, Plasma ug/dL 75.2   Comment: AM:  4.3 - 22.4 ug/dLPM:  3.1 - 16.7 ug/dL  Resulting Agency  [facility]     C206 ACTH  6 - 50 pg/mL 85 High    Comment: .  Reference range applies only to specimens collected  between 7am-10am.  .   Resulting Agency  [facility]   DHEA-Sulfate, LCMS ug/dL 91   Comment: This test was developed and its performance characteristics  determined by [facility]. It has not been cleared or approved  by the Food and Drug Administration.  Reference Range:  Adult Females (61 - 70y): <128   Resulting Agency  [facility]   At that point, I referred the patient for a pituitary MRI:  Pituitary MRI (10/02/2021): showed a microadenoma: Brain: Limited by motion degradation which is pervasive. Small pituitary gland with partially empty sella. There is a hypoenhancing nodule in the right aspect of the pituitary/sella measuring up to 3 mm on coronal postcontrast imaging, see series 26. Increased thickness along the right dorsum sella is ossification by 2019 CT. No infundibular or cavernous sinus disease.   IMPRESSION: 1. 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 Significant motion artifact. 2. Mild chronic small vessel disease.  At our visit from 10/2021, pituitary labs were at goal (with the exception of a slightly low free T3): Component     Latest Ref Rng 10/28/2021  IGF-I, LC/MS     41 - 279 ng/mL 141   Z-Score (Female)     -2.0 - 2.0 SD 0.4   Triiodothyronine,Free,Serum     2.3 - 4.2 pg/mL 2.1 (L)   T4,Free(Direct)     0.60 - 1.60 ng/dL 9.06   Growth Hormone     < OR = 7.1 ng/mL 0.1   Prolactin     ng/mL 11.4   TSH     0.35 - 5.50 uIU/mL 0.91     Due to the possibility of Cushing's disease, I referred her to [facility].  She did not have this appointment yet, as it was rescheduled.  She also has HTN. She has been in the hospital repeatedly in the past  for pancreatitis. She also has a history of MGUS, increased ferritin, osteoporosis. She was in the emergency room 12/17/2022 for after a fall.  This happened after stopping Plavix  due to side effects.  CVA was suspected but was not seen on imaging (only an old stroke).  ROS: + See HPI  I reviewed pt's medications, allergies, PMH, social hx, family hx, and changes were documented in the history of present illness. Otherwise, unchanged from my initial visit note.  Past Medical History:  Diagnosis Date   Acid reflux    Amputated left leg (HCC)    Anemia    Arthritis    Axillary masses    Soft tissue - status post excision   Back pain    Cancer (HCC)    CHF (congestive heart failure) (HCC)    COVID-19 virus infection 04/06/2019   Depression    End-stage renal disease (HCC)    M/W/F dialysis   Essential hypertension    Headache    years ago   History of blood transfusion    History of cardiac catheterization    Normal coronary arteries October 2020   History of claustrophobia    History of pneumonia 2019   Hypoxia 04/03/2019   Memory loss    Mixed hyperlipidemia    Obesity    Pancreatitis    Peritoneal dialysis catheter in place [facility])    Pneumonia due to COVID-19 virus 04/02/2019   Sleep apnea    Noncompliant with CPAP   Stroke (HCC)    mini stroke   Type 2 diabetes mellitus (HCC)    Past Surgical History:  Procedure Laterality Date   ABDOMINAL AORTOGRAM W/LOWER EXTREMITY N/A 04/30/2022   Procedure: ABDOMINAL AORTOGRAM W/LOWER EXTREMITY;  Surgeon: Magda Debby SAILOR, MD;  Location: [facility] INVAS[facility] LAB;  Service: Cardiovascular;  Laterality: N/A;   ABDOMINAL AORTOGRAM W/LOWER EXTREMITY N/A 07/21/2022   Procedure: ABDOMINAL AORTOGRAM W/LOWER EXTREMITY;  Surgeon: Serene Gaile ORN, MD;  Location: [facility] CV LAB;  Service: Cardiovascular;  Laterality: N/A;   ABDOMINAL HYSTERECTOMY     ACHILLES TENDON LENGTHENING  08/15/2022   Procedure: ACHILLES TENDON LENGTHENING;  Surgeon:  Silva Juliene SAUNDERS, DPM;  Location: [facility] OR;  Service: Podiatry;;   AMPUTATION Right 05/29/2022   Procedure: RIGHT BELOW THE KNEE AMPUTATION;  Surgeon: Harden Jerona GAILS, MD;  Location: [facility] OR;  Service: Orthopedics;  Laterality: Right;   AMPUTATION Left 09/04/2022   Procedure: AMPUTATION FOOT, serial irrigation;  Surgeon: Joya Stabs, DPM;  Location: [facility] OR;  Service: Podiatry;  Laterality: Left;  Surgical team to do block  AMPUTATION Left 10/07/2022   Procedure: LEFT BELOW KNEE AMPUTATION;  Surgeon: Harden Jerona GAILS, MD;  Location: [facility] OR;  Service: Orthopedics;  Laterality: Left;   AV FISTULA PLACEMENT Left 09/02/2017   Procedure: creation of left arm ARTERIOVENOUS (AV) FISTULA;  Surgeon: Serene Gaile ORN, MD;  Location: [facility] OR;  Service: Vascular;  Laterality: Left;   COLONOSCOPY  2008   Dr. Harvey: normal    COLONOSCOPY N/A 12/18/2016   Dr. Harvey: multiple tubular adenomas, internal hemorrhoids. Surveillance in 3 years    ESOPHAGEAL DILATION N/A 10/13/2015   Procedure: ESOPHAGEAL DILATION;  Surgeon: Claudis RAYMOND Rivet, MD;  Location: [facility];  Service: Endoscopy;  Laterality: N/A;   ESOPHAGOGASTRODUODENOSCOPY N/A 10/13/2015   Dr. Rivet: chronic gastritis on path, no H.pylori. Empiric dilation    ESOPHAGOGASTRODUODENOSCOPY N/A 12/18/2016   Dr. Harvey: mild gastritis. BRAVO study revealed uncontrolled GERD. Dysphagia secondary to uncontrolled reflux   FOOT SURGERY Bilateral    nerve     LEFT HEART CATH AND CORONARY ANGIOGRAPHY N/A 12/29/2018   Procedure: LEFT HEART CATH AND CORONARY ANGIOGRAPHY;  Surgeon: Dann Candyce RAMAN, MD;  Location: [facility] INVASIVE CV LAB;  Service: Cardiovascular;  Laterality: N/A;   LOWER EXTREMITY ANGIOGRAPHY Right 05/04/2022   Procedure: Lower Extremity Angiography;  Surgeon: Lanis Fonda BRAVO, MD;  Location: [facility] INVASIVE CV LAB;  Service: Cardiovascular;  Laterality: Right;   LUNG BIOPSY     MASS EXCISION Right 01/09/2013   Procedure: EXCISION OF NEOPLASM OF RIGHT  AXILLA   AND EXCISION OF NEOPLASM OF LEFT AXILLA;  Surgeon: Oneil DELENA Budge, MD;  Location: [facility] ORS;  Service: General;  Laterality: Right;  procedure end @ 08:23   MYRINGOTOMY WITH TUBE PLACEMENT Bilateral 04/28/2017   Procedure: BILATERAL MYRINGOTOMY WITH TUBE PLACEMENT;  Surgeon: Karis Clunes, MD;  Location: [facility] OR;  Service: ENT;  Laterality: Bilateral;   PERIPHERAL VASCULAR BALLOON ANGIOPLASTY Right 05/04/2022   Procedure: PERIPHERAL VASCULAR BALLOON ANGIOPLASTY;  Surgeon: Lanis Fonda BRAVO, MD;  Location: [facility] INVASIVE CV LAB;  Service: Cardiovascular;  Laterality: Right;  PT   PERIPHERAL VASCULAR INTERVENTION Right 05/04/2022   Procedure: PERIPHERAL VASCULAR INTERVENTION;  Surgeon: Lanis Fonda BRAVO, MD;  Location: [facility] INVASIVE CV LAB;  Service: Cardiovascular;  Laterality: Right;  SFA   PERIPHERAL VASCULAR INTERVENTION Left 07/21/2022   Procedure: PERIPHERAL VASCULAR INTERVENTION;  Surgeon: Serene Gaile ORN, MD;  Location: [facility];  Service: Cardiovascular;  Laterality: Left;   REVISION OF ARTERIOVENOUS GORETEX GRAFT Left 05/04/2018   Procedure: TRANSPOSITION OF CEPHALIC VEIN ARTERIOVENOUS FISTULA LEFT ARM;  Surgeon: Oris Krystal FALCON, MD;  Location: [facility] OR;  Service: Vascular;  Laterality: Left;   SAVORY DILATION N/A 12/18/2016   Procedure: SAVORY DILATION;  Surgeon: Harvey Margo CROME, MD;  Location: [facility];  Service: Endoscopy;  Laterality: N/A;   TRANSMETATARSAL AMPUTATION Left 08/15/2022   Procedure: TRANSMETATARSAL AMPUTATION;  Surgeon: Silva Juliene SAUNDERS, DPM;  Location: [facility] OR;  Service: Podiatry;  Laterality: Left;   Social History   Socioeconomic History   Marital status: Married    Spouse name: Not on file   Number of children: 2  Occupational History   CNA  Tobacco Use   Smoking status: Never Smoker   Smokeless tobacco: Never Used  Substance and Sexual Activity   Alcohol use: No   Drug use: No   Current Outpatient Medications on File Prior to Visit  Medication Sig Dispense Refill    acetaminophen  (TYLENOL  8 HOUR ARTHRITIS PAIN) 650 MG CR tablet Take  one tablet two times daily as needed, for pain 60 tablet 3   amLODipine  (NORVASC ) 10 MG tablet Take 1 tablet (10 mg total) by mouth daily. 90 tablet 1   aspirin  81 MG chewable tablet Chew 2 tablets (162 mg total) by mouth daily. 90 tablet 1   B Complex-C-Folic Acid  (RENA-VITE RX) 1 MG TABS Take 1 tablet by mouth daily.     busPIRone  (BUSPAR ) 5 MG tablet Take 1 tablet (5 mg total) by mouth 2 (two) times daily. 60 tablet 3   cinacalcet  (SENSIPAR ) 30 MG tablet Take 3 tablets (90 mg total) by mouth 3 (three) times a week. (Patient taking differently: Take 90 mg by mouth 3 (three) times a week. At dialysis MWF) 60 tablet    clopidogrel  (PLAVIX ) 75 MG tablet Take 1 tablet (75 mg total) by mouth daily. 30 tablet 11   doxycycline  (VIBRA -TABS) 100 MG tablet Take 1 tablet (100 mg total) by mouth 2 (two) times daily. 28 tablet 0   DULoxetine  (CYMBALTA ) 60 MG capsule TAKE (1) CAPSULE BY MOUTH ONCE DAILY. 28 capsule 11   ezetimibe  (ZETIA ) 10 MG tablet Take 1 tablet (10 mg total) by mouth daily. 28 tablet 11   FIASP  FLEXTOUCH 100 UNIT/ML FlexTouch Pen INJECT 18 TO 30 UNITS INTO THE SKIN WITH BREAKFAST, WITH LUNCH, AND WITH EVENING MEAL (Patient taking differently: Inject 1-10 Units into the skin 3 (three) times daily. Sliding scale) 90 mL 3   gabapentin  (NEURONTIN ) 100 MG capsule Take 1 capsule (100 mg total) by mouth 3 (three) times daily. 90 capsule 3   glucose blood (ONETOUCH VERIO) test strip Use as instructed to check blood sugar 4X daily. 200 each 2   hydrALAZINE  (APRESOLINE ) 25 MG tablet Take 25 mg by mouth 2 (two) times daily.     insulin  glargine, 2 Unit Dial , (TOUJEO  MAX SOLOSTAR) 300 UNIT/ML Solostar Pen Inject 30 Units into the skin daily. May need to slowly increase the dose depending upon your blood sugar, follow-up with PCP 24 mL 1   meloxicam  (MOBIC ) 7.5 MG tablet Take 1 tablet (7.5 mg total) by mouth daily. 30 tablet 3    metoprolol  succinate (TOPROL -XL) 50 MG 24 hr tablet TAKE (1) TABLET BY MOUTH DAILY WITH FOOD *TAKE AFTER DIALYSIS* 28 tablet 11   ofloxacin  (FLOXIN ) 0.3 % OTIC solution PLACE 4 DROPS INTO BOTH EARS TWICE DAILY FOR 28 DAYS     ofloxacin  (OCUFLOX ) 0.3 % ophthalmic solution Place 1 drop into the right eye 4 (four) times daily. 5 mL 0   oxyCODONE -acetaminophen  (PERCOCET/ROXICET) 5-325 MG tablet Take 1 tablet by mouth every 8 (eight) hours as needed. 20 tablet 0   oxyCODONE -acetaminophen  (PERCOCET/ROXICET) 5-325 MG tablet Take 1 tablet by mouth every 6 (six) hours as needed. 20 tablet 0   torsemide  (DEMADEX ) 100 MG tablet Take 100 mg by mouth See admin instructions. Take in Tues,Thursday,Sat & Sun     [name]  500 MG chewable tablet Chew 1-2 tablets (500-1,000 mg total) by mouth See admin instructions. Take 1000mg  (2 tablets) by mouth with meals and 500mg  (1 tablet) with snacks 90 tablet 0   [DISCONTINUED] FLUoxetine (PROZAC) 10 MG capsule Take 10 mg by mouth daily.       [DISCONTINUED] glipiZIDE  (GLUCOTROL ) 10 MG tablet Take 10 mg by mouth 2 (two) times daily before a meal.       Current Facility-Administered Medications on File Prior to Visit  Medication Dose Route Frequency Provider Last Rate Last Admin   0.9 %  sodium chloride  infusion   Intravenous Continuous Lockamy, Randi L, NP-C   Stopped at 10/07/22 1422   Allergies  Allergen Reactions   Ace Inhibitors Anaphylaxis and Swelling   Penicillins Itching and Swelling   Statins Other (See Comments)    elevated LFT's     Albuterol  Swelling   Family History  Problem Relation Age of Onset   Hypertension Father    Hypercholesterolemia Father    Arthritis Father    Hypertension Sister    Hypercholesterolemia Sister    Breast cancer Sister    Hypertension Sister    Colon cancer Neg Hx    Colon polyps Neg Hx    PE: There were no vitals taken for this visit. Pt in wheelchair - could not be weighed Wt Readings from Last 3 Encounters:   08/31/23 183 lb (83 kg)  08/20/23 180 lb (81.6 kg)  08/11/23 167 lb 15.9 oz (76.2 kg)   Constitutional: overweight, in NAD, moon facies, truncal obesity Eyes: EOMI, no exophthalmos ENT: no thyromegaly, no cervical lymphadenopathy Cardiovascular: RRR, No RG, +1/6 SEM Respiratory: CTA B Musculoskeletal: + Bilateral BKA Skin: No rashes Neurological: + Intermittent flapping tremor with outstretched hands  ASSESSMENT: 1. DM2, insulin -dependent, uncontrolled, with complications - CKD stage 4 - PN  2. HL  3. B adrenal adenomas  4.  Hypercortisolism  5. Pituitary microadenoma  PLAN:  1.  Complex patient with longstanding, uncontrolled, type 2 diabetes, previously on a premixed insulin  regimen but now basal-bolus insulin  regimen with initially improved control but worsening HbA1c at last visit.  At that time, HbA1c increased from 7.7% to 8.4%. I again recommended a CGM at that time and also recommended to take Fiasp  before small meals or snacks, as sugars were mostly at goal in the morning but they were increasing throughout the day in a stepwise fashion.  She was not always getting the Fiasp  insulin  with meals as she had smaller meals.  We previously discussed about holding Fiasp  before breakfast in the days that she had dialysis (MWF at 6:30 AM).  - I suggested to:  Patient Instructions  Please continue: - Toujeo  30 units in the evening - Fiasp :  - 10-20 units before meals (may skip Fiasp  with b'fast before dialysis) For a small meal/snack, please take 5 units of Fiasp  Use the following FiAsp  Sliding scale: - 150-175: + 1 unit  - 176-200: + 2 units  - 201-225: + 3 units  - 226-250: + 4 units  - 251-275: + 5 units - 276-300: + 6 units   Please return in 3-4 months.      - we checked her HbA1c: 7%  - advised to check sugars at different times of the day - 4x a day, rotating check times - advised for yearly eye exams >> she is UTD - return to clinic in 3-4 months  2. HL -  Latest lipid panel was reviewed from 11/2022: Fractions at goal: Lab Results  Component Value Date   CHOL 123 12/18/2022   HDL 41 12/18/2022   LDLCALC 61 12/18/2022   LDLDIRECT 120.0 11/21/2020   TRIG 105 12/18/2022   CHOLHDL 3.0 12/18/2022  -She she could not use statins due to transaminitis.  She takes Zetia  10 mg daily without side effects  3.  Bilateral adrenal adenoma -Patient with history of bilateral adrenal adenomas per review of her abdominal MRI from 2019.  Imaging characteristics pointed towards benign tumors, of 2.5 and 1.6 cm, respectively.  However, they have appeared to  increase over time.  Reviewing the MRI of the abdomen from 2010 and the CT of the abdomen and pelvis from 2017, only 1 nodule was seen in both, in the left adrenal.  She had another MRI in 03/2018 which showed a stable left 1.9 cm adrenal nodule. -We investigated her adrenal status.  Plasma catecholamines, metanephrines, and aldosterone  were normal between 2019-2022.  Norepinephrine was slightly elevated (nonspecific) 09/2018.  Plasma catecholamines, metanephrines, aldosterone, and renin activity were all normal. -Investigation of her cortisol status is difficult due to being on dialysis.  These patients usually have higher cortisol levels due to increased inflammation but not necessarily increased cortisol production.  However, repeated investigation for this returned positive. -These patients usually have higher cortisol level due to increased inflammation but not necessarily increased cortisol production.    4.  Hypercortisolism -Investigation for autonomous cortisol production was positive: She had a nonsuppressible cortisol level by dexamethasone  suppression test, high salivary cortisol and an elevated serum cortisol along with an elevated ACTH  level and normal DHEA-S -These results pointed towards a central etiology for her hypercortisolism, therefore, we checked a pituitary MRI which was positive for a  pituitary microadenoma -Therefore, it is possible that patient may have a non-hormone producing adenoma or Cushing's disease.  To determine between the 2, she may need to have an inferior petrosal sinus sampling test, which is not available here.  I referred her to [facility].  She did not have this appointment yet.  5.  Pituitary microadenoma -Please see above -After ACTH  and cortisol returned elevated, we checked a pituitary MRI (10/02/2021).  This revealed a possible 3 mm pituitary mass. -We checked her pituitary hormones and they were normal with exception of a slightly low free T3 -It is possible that the adenoma is the source of her cortisol production, however, she would need petrosal sinus sampling before possible surgery.  She is waiting for an appointment at [facility]. 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 - She previously had a pituitary MRI in the hospital but this was degraded by motion artifact and it was not with pituitary protocol.  Will try to get another MRI now.  Lela Fendt, MD PhD [facility] Endocrinology

## 2023-09-22 DIAGNOSIS — N2581 Secondary hyperparathyroidism of renal origin: Secondary | ICD-10-CM | POA: Diagnosis not present

## 2023-09-22 DIAGNOSIS — N186 End stage renal disease: Secondary | ICD-10-CM | POA: Diagnosis not present

## 2023-09-22 DIAGNOSIS — Z992 Dependence on renal dialysis: Secondary | ICD-10-CM | POA: Diagnosis not present

## 2023-09-22 NOTE — Patient Outreach (Signed)
 Complex Care Management   Visit Note  09/22/2023  Name:  Kathryn Beck MRN: [id number] DOB: 1956-02-24  Situation: Referral received for Complex Care Management related to caregiver support I obtained verbal consent from Caregiver.  Visit completed with L Emig  on the phone Completed phone visit with pt daughter and caregiver L Xie. Caregiver support provided.   Background:   Past Medical History:  Diagnosis Date   Acid reflux    Amputated left leg (HCC)    Anemia    Arthritis    Axillary masses    Soft tissue - status post excision   Back pain    Cancer (HCC)    CHF (congestive heart failure) (HCC)    COVID-19 virus infection 04/06/2019   Depression    End-stage renal disease (HCC)    M/W/F dialysis   Essential hypertension    Headache    years ago   History of blood transfusion    History of cardiac catheterization    Normal coronary arteries October 2020   History of claustrophobia    History of pneumonia 2019   Hypoxia 04/03/2019   Memory loss    Mixed hyperlipidemia    Obesity    Pancreatitis    Peritoneal dialysis catheter in place [facility])    Pneumonia due to COVID-19 virus 04/02/2019   Sleep apnea    Noncompliant with CPAP   Stroke (HCC)    mini stroke   Type 2 diabetes mellitus (HCC)     Assessment: Patient Reported Symptoms:  Cognitive Cognitive Status: Able to follow simple commands, Alert and oriented to person, place, and time Cognitive/Intellectual Conditions Management [RPT]: None reported or documented in medical history or problem list      Neurological Neurological Review of Symptoms: No symptoms reported    HEENT HEENT Symptoms Reported: No symptoms reported      Cardiovascular Cardiovascular Symptoms Reported: No symptoms reported    Respiratory Respiratory Symptoms Reported: No symptoms reported    Endocrine Endocrine Symptoms Reported: No symptoms reported Is patient diabetic?: Yes Is patient checking blood sugars at home?:  Yes    Gastrointestinal Additional Gastrointestinal Details: IBS      Genitourinary Genitourinary Symptoms Reported: No symptoms reported    Integumentary Integumentary Symptoms Reported: No symptoms reported    Musculoskeletal Musculoskelatal Symptoms Reviewed: No symptoms reported        Psychosocial Psychosocial Symptoms Reported: No symptoms reported     Quality of Family Relationships: helpful, supportive Do you feel physically threatened by others?: No      08/26/2023    9:16 AM  Depression screen PHQ 2/9  Decreased Interest 0  Down, Depressed, Hopeless 2  PHQ - 2 Score 2  Altered sleeping 3  Tired, decreased energy 3  Change in appetite 3  Feeling bad or failure about yourself  3  Trouble concentrating 3  Moving slowly or fidgety/restless 3  Suicidal thoughts 0  PHQ-9 Score 20  Difficult doing work/chores Somewhat difficult    There were no vitals filed for this visit.  Medications Reviewed Today     Reviewed by Clement Odor, LCSW (Social Worker) on 09/22/23 at 1208  Med List Status: <None>   Medication Order Taking? Sig Documenting Provider Last Dose Status Informant  0.9 %  sodium chloride  infusion [id number]   Lockamy, Randi L, NP-C  Active   acetaminophen  (TYLENOL  8 HOUR ARTHRITIS PAIN) 650 MG CR tablet [id number]  Take one tablet two times daily as needed, for pain Antonetta Quant  E, MD  Active   amLODipine  (NORVASC ) 10 MG tablet [id number]  Take 1 tablet (10 mg total) by mouth daily. Vannie Reche RAMAN, NP  Active Self  aspirin  81 MG chewable tablet [id number]  Chew 2 tablets (162 mg total) by mouth daily. Bevely Doffing, FNP  Active   B Complex-C-Folic Acid  (RENA-VITE RX) 1 MG TABS [id number]  Take 1 tablet by mouth daily. [provider]  Active Self           Med Note WORLEY, DAWN S   Thu Nov 19, 2022 11:34 AM)    busPIRone  (BUSPAR ) 5 MG tablet [id number]  Take 1 tablet (5 mg total) by mouth 2 (two) times daily. Antonetta Rollene BRAVO, MD   Active   cinacalcet  (SENSIPAR ) 30 MG tablet [id number] No Take 3 tablets (90 mg total) by mouth 3 (three) times a week.  Patient taking differently: Take 90 mg by mouth 3 (three) times a week. At dialysis MWF   Sherrill Alejandro Donovan, DO Taking Active Self           Med Note DRENA CHUCK MATSU   Sat May 01, 2023 12:25 PM)    clopidogrel  (PLAVIX ) 75 MG tablet [id number]  Take 1 tablet (75 mg total) by mouth daily. Gregg Lek, MD  Active Self  doxycycline  (VIBRA -TABS) 100 MG tablet [id number]  Take 1 tablet (100 mg total) by mouth 2 (two) times daily. Gerome Herring M, PA-C  Active   DULoxetine  (CYMBALTA ) 60 MG capsule [id number]  TAKE (1) CAPSULE BY MOUTH ONCE DAILY. Antonetta Rollene BRAVO, MD  Active Self  ezetimibe  (ZETIA ) 10 MG tablet [id number]  Take 1 tablet (10 mg total) by mouth daily. Antonetta Rollene BRAVO, MD  Active Self  FIASP  FLEXTOUCH 100 UNIT/ML FlexTouch Pen [id number] No INJECT 18 TO 30 UNITS INTO THE SKIN WITH BREAKFAST, WITH LUNCH, AND WITH EVENING MEAL  Patient taking differently: Inject 1-10 Units into the skin 3 (three) times daily. Sliding scale   Trixie File, MD Taking Active Self           Med Note DRENA CHUCK MATSU [place] Dec 17, 2022  6:21 PM)    Discontinued 05/28/11 1302 (Patient has not taken in last 30 days)   gabapentin  (NEURONTIN ) 100 MG capsule [id number] No Take 1 capsule (100 mg total) by mouth 3 (three) times daily. Gregg Lek, MD Taking Expired 08/06/23 2359   Discontinued 05/28/11 1302 (Change in therapy)   glucose blood (ONETOUCH VERIO) test strip [id number]  Use as instructed to check blood sugar 4X daily. Trixie File, MD  Active Self  hydrALAZINE  (APRESOLINE ) 25 MG tablet [id number]  Take 25 mg by mouth 2 (two) times daily. [provider]  Active Self  insulin  glargine, 2 Unit Dial , (TOUJEO  MAX SOLOSTAR) 300 UNIT/ML Solostar Pen [id number]  Inject 30 Units into the skin daily. May need to slowly increase the dose depending upon your blood sugar,  follow-up with PCP Trixie File, MD  Active Self  meloxicam  (MOBIC ) 7.5 MG tablet [id number]  Take 1 tablet (7.5 mg total) by mouth daily. Antonetta Rollene BRAVO, MD  Active   metoprolol  succinate (TOPROL -XL) 50 MG 24 hr tablet [id number]  TAKE (1) TABLET BY MOUTH DAILY WITH FOOD *TAKE AFTER DIALYSISDEWAINE Antonetta Rollene BRAVO, MD  Active Self  ofloxacin  (FLOXIN ) 0.3 % OTIC solution [id number]  PLACE 4 DROPS INTO BOTH EARS TWICE DAILY FOR 28 DAYS [provider]  Active   ofloxacin  (OCUFLOX ) 0.3 % ophthalmic solution [id number] No Place  1 drop into the right eye 4 (four) times daily. Cleotilde Rogue, MD Taking Active   oxyCODONE -acetaminophen  (PERCOCET/ROXICET) 5-325 MG tablet [id number] No Take 1 tablet by mouth every 8 (eight) hours as needed. Harden Jerona GAILS, MD Taking Active   oxyCODONE -acetaminophen  (PERCOCET/ROXICET) 5-325 MG tablet [id number]  Take 1 tablet by mouth every 6 (six) hours as needed. Gerome Herring M, PA-C  Active   torsemide  (DEMADEX ) 100 MG tablet [id number]  Take 100 mg by mouth See admin instructions. Take in Tues,Thursday,Sat & Sun [provider]  Active Self  [name]  500 MG chewable tablet [id number]  Chew 1-2 tablets (500-1,000 mg total) by mouth See admin instructions. Take 1000mg  (2 tablets) by mouth with meals and 500mg  (1 tablet) with snacks Antonetta Rollene BRAVO, MD  Active   Med List Note (Ashe, Dana K, CPhT 07/18/22 1635): Patient has dialysis on MWF            Recommendation:   Continue Current Plan of Care  Follow Up Plan:   Patient has met all care management goals. Care Management case will be closed. Patient has been provided contact information should new needs arise.   Alfonso Rummer MSW, LCSW Licensed Clinical Social Worker  [facility], Population Health Direct Dial : [phone]  Fax: [phone]

## 2023-09-22 NOTE — Patient Instructions (Signed)
 Visit Information  Thank you for taking time to visit with me today. Please don't hesitate to contact me if I can be of assistance to you before our next scheduled appointment.  Your next care management appointment is no further scheduled appointments.   Patient has met all care management goals. Care Management case will be closed. Patient has been provided contact information should new needs arise.   Please call the care guide team at [phone] if you need to cancel, schedule, or reschedule an appointment.   Please call 911 if you are experiencing a Mental Health or Behavioral Health Crisis or need someone to talk to.  Fletcher Humble MSW, LCSW Licensed Clinical Social Worker  [facility], Population Health Direct Dial: [phone]  Fax: [phone]

## 2023-09-24 DIAGNOSIS — I96 Gangrene, not elsewhere classified: Secondary | ICD-10-CM | POA: Diagnosis not present

## 2023-09-24 DIAGNOSIS — N186 End stage renal disease: Secondary | ICD-10-CM | POA: Diagnosis not present

## 2023-09-24 DIAGNOSIS — Z992 Dependence on renal dialysis: Secondary | ICD-10-CM | POA: Diagnosis not present

## 2023-09-24 DIAGNOSIS — N2581 Secondary hyperparathyroidism of renal origin: Secondary | ICD-10-CM | POA: Diagnosis not present

## 2023-09-24 DIAGNOSIS — S88912A Complete traumatic amputation of left lower leg, level unspecified, initial encounter: Secondary | ICD-10-CM | POA: Diagnosis not present

## 2023-09-27 ENCOUNTER — Encounter (HOSPITAL_COMMUNITY): Payer: Self-pay | Admitting: Orthopedic Surgery

## 2023-09-27 ENCOUNTER — Other Ambulatory Visit: Payer: Self-pay

## 2023-09-27 DIAGNOSIS — N2581 Secondary hyperparathyroidism of renal origin: Secondary | ICD-10-CM | POA: Diagnosis not present

## 2023-09-27 DIAGNOSIS — N186 End stage renal disease: Secondary | ICD-10-CM | POA: Diagnosis not present

## 2023-09-27 DIAGNOSIS — Z992 Dependence on renal dialysis: Secondary | ICD-10-CM | POA: Diagnosis not present

## 2023-09-27 NOTE — Pre-Procedure Instructions (Signed)
 -------------  SDW INSTRUCTIONS given:  Your procedure is scheduled on 7/9.  Report to [facility] Main Entrance A at 06:30 A.M., and check in at the Admitting office.  Any questions or running late day of surgery: call [phone]    Remember:  Do not eat after midnight the night before your surgery  You may drink clear liquids until 05:30 AM the morning of your surgery.   Clear liquids allowed are: Water , Non-Citrus Juices (without pulp), Carbonated Beverages, Clear Tea, Black Coffee Only, and Gatorade    Take these medicines the morning of surgery with A SIP OF WATER   amlodipine , buspar , duloxetine , zetia , gabapentin , hydralazine , metoprolol                May take these medicines IF NEEDED:  tylenol , percocet   Follow your surgeon's instructions on when to stop Aspirin  and Plavix .  If no instructions were given by your surgeon then you will need to call the office to get those instructions.      As of today, STOP taking any Aleve, Naproxen, Ibuprofen , Motrin , Advil , Goody's, BC's, all herbal medications, fish oil, and all vitamins. This includes Meloxicam .  WHAT DO I DO ABOUT MY DIABETES MEDICATION?     THE MORNING OF SURGERY, take 15 units (50%) of insulin  glargine, 2 Unit Dial , (TOUJEO  MAX SOLOSTAR)\.  If your CBG is greater than 220 mg/dL, you may take  of your sliding scale (correction) dose of FIASP  FLEXTOUCH.   HOW TO MANAGE YOUR DIABETES BEFORE AND AFTER SURGERY  Why is it important to control my blood sugar before and after surgery? Improving blood sugar levels before and after surgery helps healing and can limit problems. A way of improving blood sugar control is eating a healthy diet by:  Eating less sugar and carbohydrates  Increasing activity/exercise  Talking with your doctor about reaching your blood sugar goals High blood sugars (greater than 180 mg/dL) can raise your risk of infections and slow your recovery, so you will need to focus on controlling your  diabetes during the weeks before surgery. Make sure that the doctor who takes care of your diabetes knows about your planned surgery including the date and location.  How do I manage my blood sugar before surgery? Check your blood sugar at least 4 times a day, starting 2 days before surgery, to make sure that the level is not too high or low.  Check your blood sugar the morning of your surgery when you wake up and every 2 hours until you get to the Short Stay unit.  If your blood sugar is less than 70 mg/dL, you will need to treat for low blood sugar: Do not take insulin . Treat a low blood sugar (less than 70 mg/dL) with  cup of clear juice (cranberry or apple), 4 glucose tablets, OR glucose gel. Recheck blood sugar in 15 minutes after treatment (to make sure it is greater than 70 mg/dL). If your blood sugar is not greater than 70 mg/dL on recheck, call [phone] for further instructions. Report your blood sugar to the short stay nurse when you get to Short Stay.  If you are admitted to the hospital after surgery: Your blood sugar will be checked by the staff and you will probably be given insulin  after surgery (instead of oral diabetes medicines) to make sure you have good blood sugar levels. The goal for blood sugar control after surgery is 80-180 mg/dL.   Do NOT Smoke (Tobacco/Vaping) 24 hours prior to your  procedure  If you use a CPAP at night, you may bring all equipment for your overnight stay.     You will be asked to remove any contacts, glasses, piercing's, hearing aid's, dentures/partials prior to surgery. Please bring cases for these items if needed.     Patients discharged the day of surgery will not be allowed to drive home, and someone needs to stay with them for 24 hours.  SURGICAL WAITING ROOM VISITATION Patients may have no more than 2 support people in the waiting area - these visitors may rotate.   Pre-op  nurse will coordinate an appropriate time for 1 ADULT  support person, who may not rotate, to accompany patient in pre-op .  Children under the age of 31 must have an adult with them who is not the patient and must remain in the main waiting area with an adult.  If the patient needs to stay at the hospital during part of their recovery, the visitor guidelines for inpatient rooms apply.  Please refer to the [facility] website for the visitor guidelines for any additional information.   Special instructions:   [facility]- Preparing For Surgery   Please follow these instructions carefully.   Shower the NIGHT BEFORE SURGERY and the MORNING OF SURGERY with DIAL  Soap.   Pat yourself dry with a CLEAN TOWEL.  Wear CLEAN PAJAMAS to bed the night before surgery  Place CLEAN SHEETS on your bed the night of your first shower and DO NOT SLEEP WITH PETS.   Additional instructions for the day of surgery: DO NOT APPLY any lotions, deodorants, cologne, or perfumes.   Do not wear jewelry or makeup Do not wear nail polish, gel polish, artificial nails, or any other type of covering on natural nails (fingers and toes) Do not bring valuables to the hospital. [facility] is not responsible for valuables/personal belongings. Put on clean/comfortable clothes.  Please brush your teeth.  Ask your nurse before applying any prescription medications to the skin.

## 2023-09-27 NOTE — Progress Notes (Signed)
 PCP - Dr. Rollene Pesa Cardiologist - Dr. Jayson Sierras  PPM/ICD - denies   Chest x-ray - 04/30/23 EKG - 04/30/23 Stress Test - 06/2017 ECHO - 06/15/23 Cardiac Cath - 12/29/18  CPAP - OSA+, denies CPAP (pt's daughter said they have an appt to get her settings changed)  Fasting Blood Sugar - 80-115 Checks Blood Sugar 3 times/day  ASA/Blood Thinner Instructions: Pt instructed to f/u with surgeon ASAP for ASA and Plavix  instructions. Her daughter voiced understanding   ERAS Protcol - clears until 0530  COVID TEST- n/a  Anesthesia review: yes, cardiac hx  Patient verbally denies any shortness of breath, fever, cough and chest pain during phone call      Questions were answered for pt's daughter. [name] verbalized understanding of instructions.

## 2023-09-28 ENCOUNTER — Telehealth (HOSPITAL_BASED_OUTPATIENT_CLINIC_OR_DEPARTMENT_OTHER): Payer: Self-pay

## 2023-09-28 DIAGNOSIS — N2581 Secondary hyperparathyroidism of renal origin: Secondary | ICD-10-CM | POA: Diagnosis not present

## 2023-09-28 DIAGNOSIS — N186 End stage renal disease: Secondary | ICD-10-CM | POA: Diagnosis not present

## 2023-09-28 DIAGNOSIS — Z992 Dependence on renal dialysis: Secondary | ICD-10-CM | POA: Diagnosis not present

## 2023-09-28 NOTE — Anesthesia Preprocedure Evaluation (Addendum)
 Anesthesia Evaluation  Patient identified by MRN, date of birth, ID band Patient awake    Reviewed: Allergy & Precautions, H&P , NPO status , Patient's Chart, lab work & pertinent test results  Airway Mallampati: IV  TM Distance: >3 FB Neck ROM: Full    Dental no notable dental hx. (+) Teeth Intact, Dental Advisory Given   Pulmonary neg pulmonary ROS, sleep apnea , pneumonia   Pulmonary exam normal breath sounds clear to auscultation       Cardiovascular Exercise Tolerance: Good hypertension, + Peripheral Vascular Disease and +CHF  negative cardio ROS Normal cardiovascular exam+ Valvular Problems/Murmurs AS and MR  Rhythm:Regular Rate:Normal     Neuro/Psych  Headaches PSYCHIATRIC DISORDERS Anxiety Depression     Neuromuscular disease CVA negative neurological ROS  negative psych ROS   GI/Hepatic negative GI ROS, Neg liver ROS,GERD  ,,  Endo/Other  negative endocrine ROSdiabetes    Renal/GU Renal diseasenegative Renal ROS  negative genitourinary   Musculoskeletal negative musculoskeletal ROS (+) Arthritis ,    Abdominal   Peds negative pediatric ROS (+)  Hematology negative hematology ROS (+) Blood dyscrasia, anemia   Anesthesia Other Findings   Reproductive/Obstetrics negative OB ROS                              Anesthesia Physical Anesthesia Plan  ASA: 4  Anesthesia Plan: MAC   Post-op Pain Management:    Induction: Intravenous  PONV Risk Score and Plan: 2 and Ondansetron , Treatment may vary due to age or medical condition and Propofol  infusion  Airway Management Planned: Natural Airway and Simple Face Mask  Additional Equipment: None  Intra-op Plan:   Post-operative Plan:   Informed Consent: I have reviewed the patients History and Physical, chart, labs and discussed the procedure including the risks, benefits and alternatives for the proposed anesthesia with the patient  or authorized representative who has indicated his/her understanding and acceptance.       Plan Discussed with: Anesthesiologist and CRNA  Anesthesia Plan Comments: (PAT note by Lynwood Hope, PA-C:   68 year old female follows with cardiology for history of HTN, moderate aortic stenosis (mean gradient 21 mmHg by echo 05/2023), mild mitral stenosis, HFpEF, PAD s/p bilateral BKA, OSA on BiPAP, prior CVA, HLD.  Minimal CAD by cath 2020. last seen by Dr. Raford on 06/22/2023.  Blood pressure noted to be labile, particularly on dialysis days.  Current medications include amlodipine , hydralazine , and metoprolol , with holding on dialysis days.  Other pertinent history includes ESRD on HD MWF via LUE AVF, IDDM 2 (A1c 8.4 on 04/20/23).  She was instructed to reach out to Dr. Crist office for Plavix  instructions.  She will need day of surgery labs and evaluation.  EKG 04/30/2023: Sinus rhythm. Rate 86. Atrial premature complex. Right axis deviation  TTE 06/15/2023: 1. Left ventricular ejection fraction, by estimation, is 55%. The left  ventricle has normal function. The left ventricle has no regional wall  motion abnormalities. There is mild left ventricular hypertrophy. Left  ventricular diastolic parameters are  consistent with Grade I diastolic dysfunction (impaired relaxation).  Elevated left ventricular end-diastolic pressure.  2. Right ventricular systolic function is normal. The right ventricular  size is normal. Tricuspid regurgitation signal is inadequate for assessing  PA pressure.  3. Left atrial size was severely dilated.  4. The mitral valve is abnormal. Trivial mitral valve regurgitation. Mild  mitral stenosis. Severe mitral annular calcification.  5. The aortic  valve has an indeterminant number of cusps. Aortic valve  regurgitation is not visualized. Moderate aortic valve stenosis. Aortic  valve area, by VTI measures 1.14 cm. Aortic valve mean gradient measures  21.0 mmHg.  Aortic valve Vmax  measures 3.04 m/s.  6. The inferior vena cava is normal in size with greater than 50%  respiratory variability, suggesting right atrial pressure of 3 mmHg.   Comparison(s): Changes from prior study are noted. AS has progressed from  mild to moderate.   Cath 12/29/2018:  Large, widely patent coronary arteries.  LV end diastolic pressure is mildly elevated.  There is no aortic valve stenosis.   No significant CAD.  Continue medical therapy.   )         Anesthesia Quick Evaluation

## 2023-09-28 NOTE — Progress Notes (Addendum)
 Anesthesia Chart Review:  68 year old female follows with cardiology for history of HTN, moderate aortic stenosis (mean gradient 21 mmHg by echo 05/2023), mild mitral stenosis, HFpEF, PAD s/p bilateral BKA, OSA on BiPAP, prior CVA, HLD.  Minimal CAD by cath 2020. last seen by Dr. Raford on 06/22/2023.  Blood pressure noted to be labile, particularly on dialysis days.  Current medications include amlodipine , hydralazine , and metoprolol , with holding on dialysis days.  Other pertinent history includes ESRD on HD MWF via LUE AVF, IDDM 2 (A1c 8.4 on 04/20/23).  She was instructed to reach out to Dr. Crist office for Plavix  instructions.  She will need day of surgery labs and evaluation.  EKG 04/30/2023: Sinus rhythm. Rate 86. Atrial premature complex. Right axis deviation  TTE 06/15/2023:  1. Left ventricular ejection fraction, by estimation, is 55%. The left  ventricle has normal function. The left ventricle has no regional wall  motion abnormalities. There is mild left ventricular hypertrophy. Left  ventricular diastolic parameters are  consistent with Grade I diastolic dysfunction (impaired relaxation).  Elevated left ventricular end-diastolic pressure.   2. Right ventricular systolic function is normal. The right ventricular  size is normal. Tricuspid regurgitation signal is inadequate for assessing  PA pressure.   3. Left atrial size was severely dilated.   4. The mitral valve is abnormal. Trivial mitral valve regurgitation. Mild  mitral stenosis. Severe mitral annular calcification.   5. The aortic valve has an indeterminant number of cusps. Aortic valve  regurgitation is not visualized. Moderate aortic valve stenosis. Aortic  valve area, by VTI measures 1.14 cm. Aortic valve mean gradient measures  21.0 mmHg. Aortic valve Vmax  measures 3.04 m/s.   6. The inferior vena cava is normal in size with greater than 50%  respiratory variability, suggesting right atrial pressure of 3 mmHg.    Comparison(s): Changes from prior study are noted. AS has progressed from  mild to moderate.   Cath 12/29/2018: Large, widely patent coronary arteries. LV end diastolic pressure is mildly elevated. There is no aortic valve stenosis.   No significant CAD.  Continue medical therapy.     Lynwood Geofm RIGGERS [facility] Short Stay Center/Anesthesiology Phone [phone] 09/28/2023 11:13 AM

## 2023-09-28 NOTE — Telephone Encounter (Signed)
 CMN received signed by provider and faxed

## 2023-09-29 ENCOUNTER — Ambulatory Visit (HOSPITAL_BASED_OUTPATIENT_CLINIC_OR_DEPARTMENT_OTHER): Admitting: Physician Assistant

## 2023-09-29 ENCOUNTER — Encounter (HOSPITAL_COMMUNITY): Admitting: Physician Assistant

## 2023-09-29 ENCOUNTER — Encounter (HOSPITAL_COMMUNITY): Payer: Self-pay | Admitting: Hematology

## 2023-09-29 ENCOUNTER — Ambulatory Visit (HOSPITAL_COMMUNITY)
Admission: RE | Admit: 2023-09-29 | Discharge: 2023-09-29 | Disposition: A | Discharge status: Home / Self Care | Attending: Orthopedic Surgery | Admitting: Orthopedic Surgery

## 2023-09-29 ENCOUNTER — Encounter: Payer: Self-pay | Admitting: Family

## 2023-09-29 ENCOUNTER — Other Ambulatory Visit (HOSPITAL_COMMUNITY): Payer: Self-pay

## 2023-09-29 ENCOUNTER — Encounter (HOSPITAL_COMMUNITY): Payer: Self-pay | Admitting: Orthopedic Surgery

## 2023-09-29 ENCOUNTER — Other Ambulatory Visit: Payer: Self-pay

## 2023-09-29 ENCOUNTER — Telehealth: Payer: Self-pay

## 2023-09-29 ENCOUNTER — Encounter (HOSPITAL_COMMUNITY)
Admission: RE | Disposition: A | Discharge status: Home / Self Care | Payer: Self-pay | Source: Home / Self Care | Attending: Orthopedic Surgery

## 2023-09-29 DIAGNOSIS — E1122 Type 2 diabetes mellitus with diabetic chronic kidney disease: Secondary | ICD-10-CM | POA: Insufficient documentation

## 2023-09-29 DIAGNOSIS — N2581 Secondary hyperparathyroidism of renal origin: Secondary | ICD-10-CM | POA: Diagnosis not present

## 2023-09-29 DIAGNOSIS — Z794 Long term (current) use of insulin: Secondary | ICD-10-CM | POA: Insufficient documentation

## 2023-09-29 DIAGNOSIS — Z992 Dependence on renal dialysis: Secondary | ICD-10-CM | POA: Insufficient documentation

## 2023-09-29 DIAGNOSIS — I132 Hypertensive heart and chronic kidney disease with heart failure and with stage 5 chronic kidney disease, or end stage renal disease: Secondary | ICD-10-CM | POA: Insufficient documentation

## 2023-09-29 DIAGNOSIS — G4730 Sleep apnea, unspecified: Secondary | ICD-10-CM | POA: Diagnosis not present

## 2023-09-29 DIAGNOSIS — I96 Gangrene, not elsewhere classified: Secondary | ICD-10-CM | POA: Diagnosis not present

## 2023-09-29 DIAGNOSIS — E1152 Type 2 diabetes mellitus with diabetic peripheral angiopathy with gangrene: Secondary | ICD-10-CM | POA: Insufficient documentation

## 2023-09-29 DIAGNOSIS — I5032 Chronic diastolic (congestive) heart failure: Secondary | ICD-10-CM | POA: Diagnosis not present

## 2023-09-29 DIAGNOSIS — Z7902 Long term (current) use of antithrombotics/antiplatelets: Secondary | ICD-10-CM | POA: Insufficient documentation

## 2023-09-29 DIAGNOSIS — N186 End stage renal disease: Secondary | ICD-10-CM | POA: Diagnosis not present

## 2023-09-29 DIAGNOSIS — Z89511 Acquired absence of right leg below knee: Secondary | ICD-10-CM | POA: Diagnosis not present

## 2023-09-29 DIAGNOSIS — Z89512 Acquired absence of left leg below knee: Secondary | ICD-10-CM | POA: Insufficient documentation

## 2023-09-29 DIAGNOSIS — I509 Heart failure, unspecified: Secondary | ICD-10-CM | POA: Diagnosis not present

## 2023-09-29 HISTORY — PX: AMPUTATION FINGER: SHX6594

## 2023-09-29 LAB — GLUCOSE, CAPILLARY
Glucose-Capillary: 182 mg/dL — ABNORMAL HIGH (ref 70–99)
Glucose-Capillary: 176 mg/dL — ABNORMAL HIGH (ref 70–99)

## 2023-09-29 LAB — POCT I-STAT, CHEM 8
BUN: 29 mg/dL — ABNORMAL HIGH (ref 8–23)
Calcium, Ion: 1.15 mmol/L (ref 1.15–1.40)
Chloride: 102 mmol/L (ref 98–111)
Creatinine, Ser: 4.3 mg/dL — ABNORMAL HIGH (ref 0.44–1.00)
Glucose, Bld: 177 mg/dL — ABNORMAL HIGH (ref 70–99)
HCT: 38 % (ref 36.0–46.0)
Hemoglobin: 12.9 g/dL (ref 12.0–15.0)
Potassium: 3.5 mmol/L (ref 3.5–5.1)
Sodium: 142 mmol/L (ref 135–145)
TCO2: 27 mmol/L (ref 22–32)

## 2023-09-29 SURGERY — AMPUTATION, FINGER
Anesthesia: Monitor Anesthesia Care | Laterality: Left

## 2023-09-29 MED ORDER — ONDANSETRON HCL 4 MG/2ML IJ SOLN
INTRAMUSCULAR | Status: DC | PRN
  Administered 2023-09-29: 4 mg via INTRAVENOUS

## 2023-09-29 MED ORDER — MEPERIDINE HCL 25 MG/ML IJ SOLN: 6.2500 mg | INTRAMUSCULAR | Status: DC | PRN

## 2023-09-29 MED ORDER — INSULIN ASPART 100 UNIT/ML IJ SOLN: 0.0000 [IU] | INTRAMUSCULAR | Status: DC | PRN

## 2023-09-29 MED ORDER — LIDOCAINE HCL (PF) 1 % IJ SOLN
INTRAMUSCULAR | Status: AC
  Filled 2023-09-29: qty 30

## 2023-09-29 MED ORDER — CEFAZOLIN SODIUM-DEXTROSE 2-4 GM/100ML-% IV SOLN
2.0000 g | INTRAVENOUS | Status: AC
  Administered 2023-09-29: 2 g via INTRAVENOUS
  Filled 2023-09-29: qty 100

## 2023-09-29 MED ORDER — ONDANSETRON HCL 4 MG/2ML IJ SOLN
4.0000 mg | Freq: Once | INTRAMUSCULAR | Status: AC | PRN
  Administered 2023-09-29: 4 mg via INTRAVENOUS

## 2023-09-29 MED ORDER — ONDANSETRON HCL 4 MG/2ML IJ SOLN
INTRAMUSCULAR | Status: AC
  Filled 2023-09-29: qty 2

## 2023-09-29 MED ORDER — OXYCODONE HCL 5 MG/5ML PO SOLN: 5.0000 mg | Freq: Once | ORAL | Status: DC | PRN

## 2023-09-29 MED ORDER — PROPOFOL 500 MG/50ML IV EMUL
INTRAVENOUS | Status: DC | PRN
  Administered 2023-09-29: 80 ug/kg/min via INTRAVENOUS

## 2023-09-29 MED ORDER — OXYCODONE HCL 5 MG PO TABS: 5.0000 mg | ORAL_TABLET | Freq: Once | ORAL | Status: DC | PRN

## 2023-09-29 MED ORDER — MIDAZOLAM HCL 2 MG/2ML IJ SOLN
INTRAMUSCULAR | Status: DC | PRN
  Administered 2023-09-29: 2 mg via INTRAVENOUS

## 2023-09-29 MED ORDER — CHLORHEXIDINE GLUCONATE 0.12 % MT SOLN
15.0000 mL | Freq: Once | OROMUCOSAL | Status: AC
  Administered 2023-09-29: 15 mL via OROMUCOSAL
  Filled 2023-09-29: qty 15

## 2023-09-29 MED ORDER — ORAL CARE MOUTH RINSE: 15.0000 mL | Freq: Once | OROMUCOSAL | Status: AC

## 2023-09-29 MED ORDER — VASHE WOUND IRRIGATION OPTIME
TOPICAL | Status: DC | PRN
  Administered 2023-09-29: 34 [oz_av]

## 2023-09-29 MED ORDER — FENTANYL CITRATE (PF) 100 MCG/2ML IJ SOLN: 25.0000 ug | INTRAMUSCULAR | Status: DC | PRN

## 2023-09-29 MED ORDER — SODIUM CHLORIDE 0.9 % IV SOLN: INTRAVENOUS | Status: DC

## 2023-09-29 MED ORDER — FENTANYL CITRATE (PF) 250 MCG/5ML IJ SOLN
INTRAMUSCULAR | Status: AC
  Filled 2023-09-29: qty 5

## 2023-09-29 MED ORDER — LIDOCAINE HCL (PF) 1 % IJ SOLN
INTRAMUSCULAR | Status: DC | PRN
  Administered 2023-09-29: 30 mL

## 2023-09-29 MED ORDER — OXYCODONE-ACETAMINOPHEN 5-325 MG PO TABS
1.0000 | ORAL_TABLET | Freq: Four times a day (QID) | ORAL | 0 refills | Status: DC | PRN
  Filled 2023-09-29: qty 20, 5d supply, fill #0

## 2023-09-29 MED ORDER — MIDAZOLAM HCL 2 MG/2ML IJ SOLN
INTRAMUSCULAR | Status: AC
  Filled 2023-09-29: qty 2

## 2023-09-29 SURGICAL SUPPLY — 30 items
BAG COUNTER SPONGE SURGICOUNT (BAG) ×1 IMPLANT
BLADE SURG 21 STRL SS (BLADE) ×1 IMPLANT
BNDG COHESIVE 4X5 TAN STRL LF (GAUZE/BANDAGES/DRESSINGS) ×1 IMPLANT
BNDG COHESIVE 6X5 TAN NS LF (GAUZE/BANDAGES/DRESSINGS) IMPLANT
BNDG GAUZE DERMACEA FLUFF 4 (GAUZE/BANDAGES/DRESSINGS) ×1 IMPLANT
CLEANSER WND VASHE 34 (WOUND CARE) IMPLANT
COVER SURGICAL LIGHT HANDLE (MISCELLANEOUS) ×1 IMPLANT
DRAPE U-SHAPE 47X51 STRL (DRAPES) ×1 IMPLANT
DRSG ADAPTIC 3X8 NADH LF (GAUZE/BANDAGES/DRESSINGS) IMPLANT
DURAPREP 26ML APPLICATOR (WOUND CARE) ×1 IMPLANT
ELECTRODE REM PT RTRN 9FT ADLT (ELECTROSURGICAL) ×1 IMPLANT
GAUZE PAD ABD 8X10 STRL (GAUZE/BANDAGES/DRESSINGS) ×1 IMPLANT
GAUZE SPONGE 4X4 12PLY STRL (GAUZE/BANDAGES/DRESSINGS) IMPLANT
GLOVE BIOGEL PI IND STRL 9 (GLOVE) ×1 IMPLANT
GLOVE SURG ORTHO 9.0 STRL STRW (GLOVE) ×1 IMPLANT
GOWN STRL REUS W/ TWL XL LVL3 (GOWN DISPOSABLE) ×2 IMPLANT
KIT BASIN OR (CUSTOM PROCEDURE TRAY) ×1 IMPLANT
KIT TURNOVER KIT B (KITS) ×1 IMPLANT
MANIFOLD NEPTUNE II (INSTRUMENTS) ×1 IMPLANT
NDL 22X1.5 STRL (OR ONLY) (MISCELLANEOUS) IMPLANT
NDL HYPO 21X1.5 ECLIPSE (NEEDLE) IMPLANT
NEEDLE 22X1.5 STRL (OR ONLY) (MISCELLANEOUS) IMPLANT
NEEDLE HYPO 21X1.5 ECLIPSE (NEEDLE) IMPLANT
NS IRRIG 1000ML POUR BTL (IV SOLUTION) ×1 IMPLANT
PACK ORTHO EXTREMITY (CUSTOM PROCEDURE TRAY) ×1 IMPLANT
PAD ABD 8X10 STRL (GAUZE/BANDAGES/DRESSINGS) IMPLANT
PAD ARMBOARD POSITIONER FOAM (MISCELLANEOUS) ×2 IMPLANT
SUT ETHILON 2 0 PSLX (SUTURE) ×1 IMPLANT
SYR CONTROL 10ML LL (SYRINGE) IMPLANT
TOWEL GREEN STERILE (TOWEL DISPOSABLE) ×1 IMPLANT

## 2023-09-29 NOTE — Op Note (Signed)
 09/29/2023  8:57 AM  PATIENT:  Kathryn Beck    PRE-OPERATIVE DIAGNOSIS:  Gangrene Left Long Finger  POST-OPERATIVE DIAGNOSIS:  Same  PROCEDURE:  AMPUTATION, long finger ray left hand.  SURGEON:  Jerona LULLA Sage, MD  PHYSICIAN ASSISTANT:None ANESTHESIA:   General  PREOPERATIVE INDICATIONS:  Kathryn Beck is a  68 y.o. female with a diagnosis of Gangrene Left Long Finger who failed conservative measures and elected for surgical management.    The risks benefits and alternatives were discussed with the patient preoperatively including but not limited to the risks of infection, bleeding, nerve injury, cardiopulmonary complications, the need for revision surgery, among others, and the patient was willing to proceed.  OPERATIVE IMPLANTS:   * No implants in log *  @ENCIMAGES @  OPERATIVE FINDINGS: Patient had minimal petechial bleeding.  No abscess.  Tissue margins were clear.  OPERATIVE PROCEDURE: Patient was brought the operating room and underwent a MAC anesthetic.  After adequate levels anesthesia obtained patient's left upper extremity was prepped using DuraPrep draped into a sterile field a timeout was called.  20 cc of 1% lidocaine  plain was used for digital block.  After adequate levels anesthesia were obtained a V incision was made on the palmar and dorsal aspect of the long finger.  The ray was resected through the metacarpal.  Electrocautery was used hemostasis.  The wound was irrigated with Vashe.  The incision was closed using 2-0 nylon and a sterile dressing was applied.  Patient was taken to PACU in stable condition.   DISCHARGE PLANNING:  Antibiotic duration: Preoperative antibiotics  Weightbearing: Not applicable  Pain medication: Prescription for Percocet  Dressing care/ Wound VAC: Dry dressing reinforce as needed  Ambulatory devices: Not applicable  Discharge to: Home.  Follow-up: In the office 1 week post operative.

## 2023-09-29 NOTE — Transfer of Care (Signed)
 Immediate Anesthesia Transfer of Care Note  Patient: Kathryn Beck  Procedure(s) Performed: AMPUTATION, FINGER (Left)  Patient Location: PACU  Anesthesia Type:MAC  Level of Consciousness: awake and alert   Airway & Oxygen  Therapy: Patient Spontanous Breathing and Patient connected to face mask oxygen   Post-op Assessment: Report given to RN and Post -op Vital signs reviewed and stable  Post vital signs: Reviewed and stable  Last Vitals:  Vitals Value Taken Time  BP 143/71 09/29/23 08:56  Temp 36.9 C 09/29/23 08:56  Pulse 70 09/29/23 08:57  Resp 18 09/29/23 08:57  SpO2 99 % 09/29/23 08:57  Vitals shown include unfiled device data.  Last Pain:  Vitals:   09/29/23 0717  TempSrc:   PainSc: 8       Patients Stated Pain Goal: 2 (09/29/23 0717)  Complications: No notable events documented.

## 2023-09-29 NOTE — Discharge Instructions (Signed)
 Keep dressing clean and dry until you follow up with Dr. Harden in 1 week.  Call our office for a follow up visit.

## 2023-09-29 NOTE — Telephone Encounter (Signed)
 Patient was told to call for 1 wk follow up post procedure appt she had this am. Please call daughter, Augusto, at [phone].

## 2023-09-29 NOTE — H&P (Signed)
 Kathryn Beck is an 68 y.o. female.   Chief Complaint: Dry gangrene left long finger. HPI: Patient is a 68 year old woman with history of peripheral vascular disease.  She has had progressive gangrene of the long finger bilaterally with increasing pain in the left.  Past Medical History:  Diagnosis Date   Acid reflux    Amputated left leg (HCC)    bilateral BKA   Anemia    Arthritis    Axillary masses    Soft tissue - status post excision   Back pain    CHF (congestive heart failure) (HCC)    COVID-19 virus infection 04/06/2019   Depression    End-stage renal disease (HCC)    M/W/F dialysis   Essential hypertension    Headache    years ago   History of blood transfusion    History of cardiac catheterization    Normal coronary arteries October 2020   History of claustrophobia    History of pneumonia 2019   Hypoxia 04/03/2019   Memory loss    Mixed hyperlipidemia    Obesity    Pancreatitis    Peritoneal dialysis catheter in place [facility])    Pneumonia due to COVID-19 virus 04/02/2019   Sleep apnea    Noncompliant with CPAP   Stroke (HCC)    mini stroke   Type 2 diabetes mellitus (HCC)     Past Surgical History:  Procedure Laterality Date   ABDOMINAL AORTOGRAM W/LOWER EXTREMITY N/A 04/30/2022   Procedure: ABDOMINAL AORTOGRAM W/LOWER EXTREMITY;  Surgeon: Magda Debby SAILOR, MD;  Location: [facility];  Service: Cardiovascular;  Laterality: N/A;   ABDOMINAL AORTOGRAM W/LOWER EXTREMITY N/A 07/21/2022   Procedure: ABDOMINAL AORTOGRAM W/LOWER EXTREMITY;  Surgeon: Serene Gaile ORN, MD;  Location: [facility];  Service: Cardiovascular;  Laterality: N/A;   ABDOMINAL HYSTERECTOMY     ACHILLES TENDON LENGTHENING  08/15/2022   Procedure: ACHILLES TENDON LENGTHENING;  Surgeon: Silva Juliene SAUNDERS, DPM;  Location: [facility] OR;  Service: Podiatry;;   AMPUTATION Right 05/29/2022   Procedure: RIGHT BELOW THE KNEE AMPUTATION;  Surgeon: Harden Jerona GAILS, MD;  Location: [facility] OR;  Service:  Orthopedics;  Laterality: Right;   AMPUTATION Left 09/04/2022   Procedure: AMPUTATION FOOT, serial irrigation;  Surgeon: Joya Stabs, DPM;  Location: [facility] OR;  Service: Podiatry;  Laterality: Left;  Surgical team to do block   AMPUTATION Left 10/07/2022   Procedure: LEFT BELOW KNEE AMPUTATION;  Surgeon: Harden Jerona GAILS, MD;  Location: [facility] OR;  Service: Orthopedics;  Laterality: Left;   AV FISTULA PLACEMENT Left 09/02/2017   Procedure: creation of left arm ARTERIOVENOUS (AV) FISTULA;  Surgeon: Serene Gaile ORN, MD;  Location: [facility] OR;  Service: Vascular;  Laterality: Left;   COLONOSCOPY  2008   Dr. Harvey: normal    COLONOSCOPY N/A 12/18/2016   Dr. Harvey: multiple tubular adenomas, internal hemorrhoids. Surveillance in 3 years    ESOPHAGEAL DILATION N/A 10/13/2015   Procedure: ESOPHAGEAL DILATION;  Surgeon: Claudis RAYMOND Rivet, MD;  Location: [facility];  Service: Endoscopy;  Laterality: N/A;   ESOPHAGOGASTRODUODENOSCOPY N/A 10/13/2015   Dr. Rivet: chronic gastritis on path, no H.pylori. Empiric dilation    ESOPHAGOGASTRODUODENOSCOPY N/A 12/18/2016   Dr. Harvey: mild gastritis. BRAVO study revealed uncontrolled GERD. Dysphagia secondary to uncontrolled reflux   FOOT SURGERY Bilateral    nerve     LEFT HEART CATH AND CORONARY ANGIOGRAPHY N/A 12/29/2018   Procedure: LEFT HEART CATH AND CORONARY ANGIOGRAPHY;  Surgeon: Dann Candyce RAMAN,  MD;  Location: [facility];  Service: Cardiovascular;  Laterality: N/A;   LOWER EXTREMITY ANGIOGRAPHY Right 05/04/2022   Procedure: Lower Extremity Angiography;  Surgeon: Lanis Fonda BRAVO, MD;  Location: [facility] INVASIVE CV LAB;  Service: Cardiovascular;  Laterality: Right;   LUNG BIOPSY     MASS EXCISION Right 01/09/2013   Procedure: EXCISION OF NEOPLASM OF RIGHT  AXILLA  AND EXCISION OF NEOPLASM OF LEFT AXILLA;  Surgeon: Oneil DELENA Budge, MD;  Location: [facility] ORS;  Service: General;  Laterality: Right;  procedure end @ 08:23   MYRINGOTOMY WITH TUBE PLACEMENT Bilateral  04/28/2017   Procedure: BILATERAL MYRINGOTOMY WITH TUBE PLACEMENT;  Surgeon: Karis Clunes, MD;  Location: [facility] OR;  Service: ENT;  Laterality: Bilateral;   PERIPHERAL VASCULAR BALLOON ANGIOPLASTY Right 05/04/2022   Procedure: PERIPHERAL VASCULAR BALLOON ANGIOPLASTY;  Surgeon: Lanis Fonda BRAVO, MD;  Location: [facility] INVASIVE CV LAB;  Service: Cardiovascular;  Laterality: Right;  PT   PERIPHERAL VASCULAR INTERVENTION Right 05/04/2022   Procedure: PERIPHERAL VASCULAR INTERVENTION;  Surgeon: Lanis Fonda BRAVO, MD;  Location: [facility] INVASIVE CV LAB;  Service: Cardiovascular;  Laterality: Right;  SFA   PERIPHERAL VASCULAR INTERVENTION Left 07/21/2022   Procedure: PERIPHERAL VASCULAR INTERVENTION;  Surgeon: Serene Gaile ORN, MD;  Location: [facility];  Service: Cardiovascular;  Laterality: Left;   REVISION OF ARTERIOVENOUS GORETEX GRAFT Left 05/04/2018   Procedure: TRANSPOSITION OF CEPHALIC VEIN ARTERIOVENOUS FISTULA LEFT ARM;  Surgeon: Oris Krystal FALCON, MD;  Location: [facility] OR;  Service: Vascular;  Laterality: Left;   SAVORY DILATION N/A 12/18/2016   Procedure: SAVORY DILATION;  Surgeon: Harvey Margo CROME, MD;  [facility]: [facility];  Service: Endoscopy;  Laterality: N/A;   TRANSMETATARSAL AMPUTATION Left 08/15/2022   Procedure: TRANSMETATARSAL AMPUTATION;  Surgeon: Silva Juliene SAUNDERS, DPM;  Location: [facility] OR;  Service: Podiatry;  Laterality: Left;    Family History  Problem Relation Age of Onset   Hypertension Father    Hypercholesterolemia Father    Arthritis Father    Hypertension Sister    Hypercholesterolemia Sister    Breast cancer Sister    Hypertension Sister    Colon cancer Neg Hx    Colon polyps Neg Hx    Social History:  reports that she has never smoked. She has never been exposed to tobacco smoke. She has never used smokeless tobacco. She reports that she does not drink alcohol and does not use drugs.  Allergies:  Allergies  Allergen Reactions   Ace Inhibitors Anaphylaxis and Swelling   Penicillins  Itching and Swelling    Has tolerated cefazolin  on multiple occasions    Statins Other (See Comments)    elevated LFT's     Albuterol  Swelling    Medications Prior to Admission  Medication Sig Dispense Refill   acetaminophen  (TYLENOL  8 HOUR ARTHRITIS PAIN) 650 MG CR tablet Take one tablet two times daily as needed, for pain 60 tablet 3   amLODipine  (NORVASC ) 10 MG tablet Take 1 tablet (10 mg total) by mouth daily. 90 tablet 1   aspirin  81 MG chewable tablet Chew 2 tablets (162 mg total) by mouth daily. 90 tablet 1   B Complex-C-Folic Acid  (RENA-VITE RX) 1 MG TABS Take 1 tablet by mouth daily.     busPIRone  (BUSPAR ) 5 MG tablet Take 1 tablet (5 mg total) by mouth 2 (two) times daily. 60 tablet 3   cinacalcet  (SENSIPAR ) 30 MG tablet Take 3 tablets (90 mg total) by mouth 3 (three) times a week. (Patient  taking differently: Take 90 mg by mouth every Monday, Wednesday, and Friday.) 60 tablet    clopidogrel  (PLAVIX ) 75 MG tablet Take 1 tablet (75 mg total) by mouth daily. 30 tablet 11   DULoxetine  (CYMBALTA ) 60 MG capsule TAKE (1) CAPSULE BY MOUTH ONCE DAILY. 28 capsule 11   ezetimibe  (ZETIA ) 10 MG tablet Take 1 tablet (10 mg total) by mouth daily. 28 tablet 11   FIASP  FLEXTOUCH 100 UNIT/ML FlexTouch Pen INJECT 18 TO 30 UNITS INTO THE SKIN WITH BREAKFAST, WITH LUNCH, AND WITH EVENING MEAL (Patient taking differently: Inject 1-10 Units into the skin 3 (three) times daily. Sliding scale) 90 mL 3   hydrALAZINE  (APRESOLINE ) 25 MG tablet Take 25 mg by mouth 2 (two) times daily.     insulin  glargine, 2 Unit Dial , (TOUJEO  MAX SOLOSTAR) 300 UNIT/ML Solostar Pen Inject 30 Units into the skin daily. May need to slowly increase the dose depending upon your blood sugar, follow-up with PCP 24 mL 1   meloxicam  (MOBIC ) 7.5 MG tablet Take 1 tablet (7.5 mg total) by mouth daily. 30 tablet 3   metoprolol  succinate (TOPROL -XL) 50 MG 24 hr tablet TAKE (1) TABLET BY MOUTH DAILY WITH FOOD *TAKE AFTER DIALYSIS* (Patient  taking differently: Take 50 mg by mouth Every Tuesday,Thursday,and Saturday with dialysis. Non Dialysis including sunday) 28 tablet 11   oxyCODONE -acetaminophen  (PERCOCET/ROXICET) 5-325 MG tablet Take 1 tablet by mouth every 6 (six) hours as needed. 20 tablet 0   torsemide  (DEMADEX ) 100 MG tablet Take 100 mg by mouth See admin instructions. Take in Tues,Thursday,Sat & Sun     [name]  500 MG chewable tablet Chew 1-2 tablets (500-1,000 mg total) by mouth See admin instructions. Take 1000mg  (2 tablets) by mouth with meals and 500mg  (1 tablet) with snacks 90 tablet 0   gabapentin  (NEURONTIN ) 100 MG capsule Take 1 capsule (100 mg total) by mouth 3 (three) times daily. 90 capsule 3   glucose blood (ONETOUCH VERIO) test strip Use as instructed to check blood sugar 4X daily. 200 each 2   ofloxacin  (OCUFLOX ) 0.3 % ophthalmic solution Place 1 drop into the right eye 4 (four) times daily. (Patient not taking: Reported on 09/23/2023) 5 mL 0    Results for orders placed or performed during the hospital encounter of 09/29/23 (from the past 48 hours)  Glucose, capillary     Status: Abnormal   Collection Time: 09/29/23  7:06 AM  Result Value Ref Range   Glucose-Capillary 182 (H) 70 - 99 mg/dL    Comment: Glucose reference range applies only to samples taken after fasting for at least 8 hours.   Comment 1 Notify RN   I-STAT, chem 8     Status: Abnormal   Collection Time: 09/29/23  7:18 AM  Result Value Ref Range   Sodium 142 135 - 145 mmol/L   Potassium 3.5 3.5 - 5.1 mmol/L   Chloride 102 98 - 111 mmol/L   BUN 29 (H) 8 - 23 mg/dL   Creatinine, Ser 5.69 (H) 0.44 - 1.00 mg/dL   Glucose, Bld 822 (H) 70 - 99 mg/dL    Comment: Glucose reference range applies only to samples taken after fasting for at least 8 hours.   Calcium , Ion 1.15 1.15 - 1.40 mmol/L   TCO2 27 22 - 32 mmol/L   Hemoglobin 12.9 12.0 - 15.0 g/dL   HCT 61.9 63.9 - 53.9 %   *Note: Due to a large number of results and/or encounters for the  requested time  period, some results have not been displayed. A complete set of results can be found in Results Review.   No results found.  Review of Systems  All other systems reviewed and are negative.   Blood pressure (!) 174/81, pulse 75, temperature 98.1 F (36.7 C), temperature source Oral, resp. rate 17, height 5' 4 (1.626 m), weight 84.8 kg, SpO2 95%. Physical Exam  Examination patient is alert oriented no adenopathy well-dressed normal affect normal respiratory effort.  She has painful dry gangrenous changes to the left long finger.  There is also gangrenous changes to the right long finger which are not symptomatic. Assessment/Plan Assessment: Dry gangrene in the left long finger.  Plan: Will plan for left hand long finger ray amputation.  Risk and benefits were discussed including risk of the wound not healing.  Patient states she understands wished to proceed at this time.  Jerona LULLA Sage, MD 09/29/2023, 8:07 AM

## 2023-09-29 NOTE — Anesthesia Postprocedure Evaluation (Signed)
 Anesthesia Post Note  Patient: SURIYA KOVARIK  Procedure(s) Performed: AMPUTATION, FINGER (Left)     Patient location during evaluation: PACU Anesthesia Type: MAC Level of consciousness: awake and alert Pain management: pain level controlled Vital Signs Assessment: post-procedure vital signs reviewed and stable Respiratory status: spontaneous breathing, nonlabored ventilation, respiratory function stable and patient connected to nasal cannula oxygen  Cardiovascular status: stable and blood pressure returned to baseline Postop Assessment: no apparent nausea or vomiting Anesthetic complications: no   No notable events documented.  Last Vitals:  Vitals:   09/29/23 0915 09/29/23 0930  BP: (!) 149/71 137/80  Pulse: 67 67  Resp: 17 16  Temp: 36.5 C   SpO2: 94% 97%    Last Pain:  Vitals:   09/29/23 0915  TempSrc:   PainSc: 0-No pain                 Sindhu Nguyen

## 2023-09-30 ENCOUNTER — Encounter (HOSPITAL_COMMUNITY): Payer: Self-pay | Admitting: Orthopedic Surgery

## 2023-09-30 ENCOUNTER — Ambulatory Visit (INDEPENDENT_AMBULATORY_CARE_PROVIDER_SITE_OTHER): Admitting: Otolaryngology

## 2023-10-01 ENCOUNTER — Encounter (HOSPITAL_COMMUNITY): Payer: Self-pay | Admitting: Hematology

## 2023-10-01 ENCOUNTER — Encounter: Payer: Self-pay | Admitting: Family

## 2023-10-01 ENCOUNTER — Other Ambulatory Visit: Payer: Self-pay

## 2023-10-01 ENCOUNTER — Other Ambulatory Visit (HOSPITAL_COMMUNITY): Payer: Self-pay

## 2023-10-01 DIAGNOSIS — N186 End stage renal disease: Secondary | ICD-10-CM | POA: Diagnosis not present

## 2023-10-01 DIAGNOSIS — Z992 Dependence on renal dialysis: Secondary | ICD-10-CM | POA: Diagnosis not present

## 2023-10-01 DIAGNOSIS — N2581 Secondary hyperparathyroidism of renal origin: Secondary | ICD-10-CM | POA: Diagnosis not present

## 2023-10-04 DIAGNOSIS — Z89511 Acquired absence of right leg below knee: Secondary | ICD-10-CM | POA: Diagnosis not present

## 2023-10-04 DIAGNOSIS — S88912A Complete traumatic amputation of left lower leg, level unspecified, initial encounter: Secondary | ICD-10-CM | POA: Diagnosis not present

## 2023-10-04 DIAGNOSIS — N186 End stage renal disease: Secondary | ICD-10-CM | POA: Diagnosis not present

## 2023-10-04 DIAGNOSIS — Z992 Dependence on renal dialysis: Secondary | ICD-10-CM | POA: Diagnosis not present

## 2023-10-04 DIAGNOSIS — I96 Gangrene, not elsewhere classified: Secondary | ICD-10-CM | POA: Diagnosis not present

## 2023-10-04 DIAGNOSIS — N2581 Secondary hyperparathyroidism of renal origin: Secondary | ICD-10-CM | POA: Diagnosis not present

## 2023-10-05 ENCOUNTER — Encounter (INDEPENDENT_AMBULATORY_CARE_PROVIDER_SITE_OTHER): Payer: Self-pay | Admitting: Otolaryngology

## 2023-10-05 ENCOUNTER — Ambulatory Visit (INDEPENDENT_AMBULATORY_CARE_PROVIDER_SITE_OTHER): Admitting: Otolaryngology

## 2023-10-05 VITALS — BP 163/73 | HR 62

## 2023-10-05 DIAGNOSIS — Z9889 Other specified postprocedural states: Secondary | ICD-10-CM

## 2023-10-05 DIAGNOSIS — H6993 Unspecified Eustachian tube disorder, bilateral: Secondary | ICD-10-CM | POA: Diagnosis not present

## 2023-10-05 DIAGNOSIS — H6123 Impacted cerumen, bilateral: Secondary | ICD-10-CM | POA: Diagnosis not present

## 2023-10-05 DIAGNOSIS — H90A31 Mixed conductive and sensorineural hearing loss, unilateral, right ear with restricted hearing on the contralateral side: Secondary | ICD-10-CM | POA: Diagnosis not present

## 2023-10-05 DIAGNOSIS — H938X3 Other specified disorders of ear, bilateral: Secondary | ICD-10-CM | POA: Diagnosis not present

## 2023-10-05 MED ORDER — FLUTICASONE PROPIONATE 50 MCG/ACT NA SUSP: 2.0000 | Freq: Two times a day (BID) | NASAL | 6 refills | Status: DC

## 2023-10-05 MED ORDER — OFLOXACIN 0.3 % OT SOLN
4.0000 [drp] | Freq: Two times a day (BID) | OTIC | 1 refills | Status: AC
Start: 2023-10-05 — End: 2023-10-12

## 2023-10-05 NOTE — Progress Notes (Signed)
 Dear Dr. Antonetta, Here is my assessment for our mutual patient, Kathryn Beck. Thank you for allowing me the opportunity to care for your patient. Please do not hesitate to contact me should you have any other questions. Sincerely, Dr. Eldora Blanch  Otolaryngology Clinic Note Referring provider: Dr. Antonetta HPI:  Kathryn Beck is a 68 y.o. female kindly referred by Dr. Antonetta for evaluation of bilateral ear fullness and pressure.  Initial visit (06/2023): Patient reports: she had a URI in Jan/Feb 2025 and since then reports she has had bilateral ear fullness and muffled hearing. She describes it as popping or going through the mountains. She denies pain in the ear or drainage from the ear. Reports hearing is muffled but not decreased. She reports she has not tried any ear drops for this, but does report she took doxycycline  and while she was admitted for CAP and prescribed prednisone  and levaquin . This did not improve things significantly. 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 Karis in 2019 with improvement. She does admit to using qtips and manipulating her ears regularly Patient currrently denies: ear pain, vertigo, drainage, tinnitus Patient also denies barotrauma, vestibular suppressant use, ototoxic medication use Prior ear surgery: BMT  --------------------------------------------------------- 08/03/2023 Returns for follow up. Reports symptoms are some better but still having bilateral ear fullness without pain or drainage. She did use floxin  drops but ran out. She did have an audiogram today. Hearing is worse on right  --------------------------------------------------------- 10/05/2023 Seen in follow up. Symptoms improved some but still some popping in ear. Using floxin  drops. She is interested in ear tube replacement. Hearing stable, no drainage or ear pain or vertigo. She does not use any nasal medications. She  recently had gangrene of her finger and underwent amputation  Personal or FHx of bleeding dz or anesthesia difficulty: no   GLP-1: No AP/AC: ASA, Plavix   Tobacco: no  PMHx: DM, CHF, ESRD on Dialysis, HTN, Pancreatits, b/l LE amputations, AVS  Independent Review of Additional Tests or Records:  Dr. Jesus ([facility]) 06/17/2021 and 07/15/2021: noted left cerumen impaction, and exudate on right; prior h/o tubes by Dr. Karis; some pain both ears, drainage right; uses q-tips; Rec ciprodex  drops, otherwise f/u PRN Dr. Antonetta (04/27/2023) FM: URI sx including thick sputum, noted hearing loss and pressure in ears; Dx: Otalgia and HL; ref to ENT Admitted for CAP, RSV positive, on levaquin  and prednisone  CBC and BMP 05/02/2023: WBC 4.9, Hgb 11.5, Plt 264; BUN/Cr 58/5.72 CT Face 12/17/2022 independently interpreted with respect to ears: noted well aerated mastoids and ME; otic capsule and ossicular chain unremarkable; modest cerumen/debris bilaterally, unable to visualize any PE tube or perforation 07/2023 Audiogram was independently reviewed and interpreted by me and it reveals - downsloping SNHL AS with 92% WRT at 90dB; type; As tymp AS; on right, noted significant CHL with ABG as below in lower frequencies with WRT 96% at 100dB; large vol? Tymp (could not obtain seal)  SNHL= Sensorineural hearing loss  PMH/Meds/All/SocHx/FamHx/ROS:   Past Medical History:  Diagnosis Date   Acid reflux    Amputated left leg (HCC)    bilateral BKA   Anemia    Arthritis    Axillary masses    Soft tissue - status post excision   Back pain    CHF (congestive heart failure) (HCC)    COVID-19 virus infection 04/06/2019   Depression    End-stage renal disease (HCC)    M/W/F  dialysis   Essential hypertension    Headache    years ago   History of blood transfusion    History of cardiac catheterization    Normal coronary arteries October 2020   History of claustrophobia    History of pneumonia 2019   Hypoxia  04/03/2019   Memory loss    Mixed hyperlipidemia    Obesity    Pancreatitis    Peritoneal dialysis catheter in place [facility])    Pneumonia due to COVID-19 virus 04/02/2019   Sleep apnea    Noncompliant with CPAP   Stroke (HCC)    mini stroke   Type 2 diabetes mellitus (HCC)      Past Surgical History:  Procedure Laterality Date   ABDOMINAL AORTOGRAM W/LOWER EXTREMITY N/A 04/30/2022   Procedure: ABDOMINAL AORTOGRAM W/LOWER EXTREMITY;  Surgeon: Magda Debby SAILOR, MD;  Location: [facility];  Service: Cardiovascular;  Laterality: N/A;   ABDOMINAL AORTOGRAM W/LOWER EXTREMITY N/A 07/21/2022   Procedure: ABDOMINAL AORTOGRAM W/LOWER EXTREMITY;  Surgeon: Serene Gaile ORN, MD;  Location: [facility];  Service: Cardiovascular;  Laterality: N/A;   ABDOMINAL HYSTERECTOMY     ACHILLES TENDON LENGTHENING  08/15/2022   Procedure: ACHILLES TENDON LENGTHENING;  Surgeon: Silva Juliene SAUNDERS, DPM;  Location: [facility] OR;  Service: Podiatry;;   AMPUTATION Right 05/29/2022   Procedure: RIGHT BELOW THE KNEE AMPUTATION;  Surgeon: Harden Jerona GAILS, MD;  Location: [facility] OR;  Service: Orthopedics;  Laterality: Right;   AMPUTATION Left 09/04/2022   Procedure: AMPUTATION FOOT, serial irrigation;  Surgeon: Joya Stabs, DPM;  Location: [facility] OR;  Service: Podiatry;  Laterality: Left;  Surgical team to do block   AMPUTATION Left 10/07/2022   Procedure: LEFT BELOW KNEE AMPUTATION;  Surgeon: Harden Jerona GAILS, MD;  Location: [facility] OR;  Service: Orthopedics;  Laterality: Left;   AMPUTATION FINGER Left 09/29/2023   Procedure: AMPUTATION, FINGER;  Surgeon: Harden Jerona GAILS, MD;  Location: [facility] OR;  Service: Orthopedics;  Laterality: Left;  LEFT HAND LONG FINGER RAY AMPUTATION   AV FISTULA PLACEMENT Left 09/02/2017   Procedure: creation of left arm ARTERIOVENOUS (AV) FISTULA;  Surgeon: Serene Gaile ORN, MD;  Location: [facility] OR;  Service: Vascular;  Laterality: Left;   COLONOSCOPY  2008   Dr. Harvey: normal    COLONOSCOPY N/A 12/18/2016   Dr.  Harvey: multiple tubular adenomas, internal hemorrhoids. Surveillance in 3 years    ESOPHAGEAL DILATION N/A 10/13/2015   Procedure: ESOPHAGEAL DILATION;  Surgeon: Claudis RAYMOND Rivet, MD;  [facility]: [facility];  Service: Endoscopy;  Laterality: N/A;   ESOPHAGOGASTRODUODENOSCOPY N/A 10/13/2015   Dr. Rivet: chronic gastritis on path, no H.pylori. Empiric dilation    ESOPHAGOGASTRODUODENOSCOPY N/A 12/18/2016   Dr. Harvey: mild gastritis. BRAVO study revealed uncontrolled GERD. Dysphagia secondary to uncontrolled reflux   FOOT SURGERY Bilateral    nerve     LEFT HEART CATH AND CORONARY ANGIOGRAPHY N/A 12/29/2018   Procedure: LEFT HEART CATH AND CORONARY ANGIOGRAPHY;  Surgeon: Dann Candyce RAMAN, MD;  Location: [facility] INVASIVE CV LAB;  Service: Cardiovascular;  Laterality: N/A;   LOWER EXTREMITY ANGIOGRAPHY Right 05/04/2022   Procedure: Lower Extremity Angiography;  Surgeon: Lanis Fonda BRAVO, MD;  Location: [facility] INVASIVE CV LAB;  Service: Cardiovascular;  Laterality: Right;   LUNG BIOPSY     MASS EXCISION Right 01/09/2013   Procedure: EXCISION OF NEOPLASM OF RIGHT  AXILLA  AND EXCISION OF NEOPLASM OF LEFT AXILLA;  Surgeon: Oneil DELENA Budge, MD;  Location: [facility];  Service: General;  Laterality: Right;  procedure end @ 08:23   MYRINGOTOMY WITH TUBE PLACEMENT Bilateral 04/28/2017   Procedure: BILATERAL MYRINGOTOMY WITH TUBE PLACEMENT;  Surgeon: Karis Clunes, MD;  Location: [facility] OR;  Service: ENT;  Laterality: Bilateral;   PERIPHERAL VASCULAR BALLOON ANGIOPLASTY Right 05/04/2022   Procedure: PERIPHERAL VASCULAR BALLOON ANGIOPLASTY;  Surgeon: Lanis Fonda BRAVO, MD;  Location: [facility] INVASIVE CV LAB;  Service: Cardiovascular;  Laterality: Right;  PT   PERIPHERAL VASCULAR INTERVENTION Right 05/04/2022   Procedure: PERIPHERAL VASCULAR INTERVENTION;  Surgeon: Lanis Fonda BRAVO, MD;  Location: [facility] INVASIVE CV LAB;  Service: Cardiovascular;  Laterality: Right;  SFA   PERIPHERAL VASCULAR INTERVENTION Left 07/21/2022   Procedure:  PERIPHERAL VASCULAR INTERVENTION;  Surgeon: Serene Gaile ORN, MD;  Location: [facility];  Service: Cardiovascular;  Laterality: Left;   REVISION OF ARTERIOVENOUS GORETEX GRAFT Left 05/04/2018   Procedure: TRANSPOSITION OF CEPHALIC VEIN ARTERIOVENOUS FISTULA LEFT ARM;  Surgeon: Oris Krystal FALCON, MD;  Location: [facility] OR;  Service: Vascular;  Laterality: Left;   SAVORY DILATION N/A 12/18/2016   Procedure: SAVORY DILATION;  Surgeon: Harvey Margo CROME, MD;  [facility]: [facility];  Service: Endoscopy;  Laterality: N/A;   TRANSMETATARSAL AMPUTATION Left 08/15/2022   Procedure: TRANSMETATARSAL AMPUTATION;  Surgeon: Silva Juliene SAUNDERS, DPM;  Location: [facility] OR;  Service: Podiatry;  Laterality: Left;    Family History  Problem Relation Age of Onset   Hypertension Father    Hypercholesterolemia Father    Arthritis Father    Hypertension Sister    Hypercholesterolemia Sister    Breast cancer Sister    Hypertension Sister    Colon cancer Neg Hx    Colon polyps Neg Hx      Social Connections: Socially Integrated (05/03/2023)   Social Connection and Isolation Panel    Frequency of Communication with Friends and Family: More than three times a week    Frequency of Social Gatherings with Friends and Family: More than three times a week    Attends Religious Services: More than 4 times per year    Active Member of [employer] or Organizations: Yes    Attends Engineer, structural: More than 4 times per year    Marital Status: Married      Current Outpatient Medications:    acetaminophen  (TYLENOL  8 HOUR ARTHRITIS PAIN) 650 MG CR tablet, Take one tablet two times daily as needed, for pain, Disp: 60 tablet, Rfl: 3   amLODipine  (NORVASC ) 10 MG tablet, Take 1 tablet (10 mg total) by mouth daily., Disp: 90 tablet, Rfl: 1   aspirin  81 MG chewable tablet, Chew 2 tablets (162 mg total) by mouth daily., Disp: 90 tablet, Rfl: 1   B Complex-C-Folic Acid  (RENA-VITE RX) 1 MG TABS, Take 1 tablet by mouth daily., Disp:  , Rfl:    busPIRone  (BUSPAR ) 5 MG tablet, Take 1 tablet (5 mg total) by mouth 2 (two) times daily., Disp: 60 tablet, Rfl: 3   cinacalcet  (SENSIPAR ) 30 MG tablet, Take 3 tablets (90 mg total) by mouth 3 (three) times a week. (Patient taking differently: Take 90 mg by mouth every Monday, Wednesday, and Friday.), Disp: 60 tablet, Rfl:    clopidogrel  (PLAVIX ) 75 MG tablet, Take 1 tablet (75 mg total) by mouth daily., Disp: 30 tablet, Rfl: 11   DULoxetine  (CYMBALTA ) 60 MG capsule, TAKE (1) CAPSULE BY MOUTH ONCE DAILY., Disp: 28 capsule, Rfl: 11   ezetimibe  (ZETIA ) 10 MG tablet, Take 1 tablet (10 mg total) by mouth daily., Disp: 28  tablet, Rfl: 11   FIASP  FLEXTOUCH 100 UNIT/ML FlexTouch Pen, INJECT 18 TO 30 UNITS INTO THE SKIN WITH BREAKFAST, WITH LUNCH, AND WITH EVENING MEAL (Patient taking differently: Inject 1-10 Units into the skin 3 (three) times daily. Sliding scale), Disp: 90 mL, Rfl: 3   fluticasone  (FLONASE ) 50 MCG/ACT nasal spray, Place 2 sprays into both nostrils in the morning and at bedtime., Disp: 16 g, Rfl: 6   gabapentin  (NEURONTIN ) 100 MG capsule, Take 1 capsule (100 mg total) by mouth 3 (three) times daily., Disp: 90 capsule, Rfl: 3   glucose blood (ONETOUCH VERIO) test strip, Use as instructed to check blood sugar 4X daily., Disp: 200 each, Rfl: 2   hydrALAZINE  (APRESOLINE ) 25 MG tablet, Take 25 mg by mouth 2 (two) times daily., Disp: , Rfl:    insulin  glargine, 2 Unit Dial , (TOUJEO  MAX SOLOSTAR) 300 UNIT/ML Solostar Pen, Inject 30 Units into the skin daily. May need to slowly increase the dose depending upon your blood sugar, follow-up with PCP, Disp: 24 mL, Rfl: 1   meloxicam  (MOBIC ) 7.5 MG tablet, Take 1 tablet (7.5 mg total) by mouth daily., Disp: 30 tablet, Rfl: 3   metoprolol  succinate (TOPROL -XL) 50 MG 24 hr tablet, TAKE (1) TABLET BY MOUTH DAILY WITH FOOD *TAKE AFTER DIALYSIS* (Patient taking differently: Take 50 mg by mouth Every Tuesday,Thursday,and Saturday with dialysis. Non  Dialysis including sunday), Disp: 28 tablet, Rfl: 11   ofloxacin  (FLOXIN ) 0.3 % OTIC solution, Place 4 drops into both ears 2 (two) times daily for 7 days., Disp: 10 mL, Rfl: 1   oxyCODONE -acetaminophen  (PERCOCET/ROXICET) 5-325 MG tablet, Take 1 tablet by mouth every 6 (six) hours as needed., Disp: 20 tablet, Rfl: 0   torsemide  (DEMADEX ) 100 MG tablet, Take 100 mg by mouth See admin instructions. Take in Tues,Thursday,Sat & Sun, Disp: , Rfl:    [name]  500 MG chewable tablet, Chew 1-2 tablets (500-1,000 mg total) by mouth See admin instructions. Take 1000mg  (2 tablets) by mouth with meals and 500mg  (1 tablet) with snacks, Disp: 90 tablet, Rfl: 0   ofloxacin  (OCUFLOX ) 0.3 % ophthalmic solution, Place 1 drop into the right eye 4 (four) times daily. (Patient not taking: Reported on 10/05/2023), Disp: 5 mL, Rfl: 0 No current facility-administered medications for this visit.  Facility-Administered Medications Ordered in Other Visits:    0.9 %  sodium chloride  infusion, , Intravenous, Continuous, Lockamy, Randi L, NP-C, Last Rate: 0 mL/hr at 10/07/22 1422, New Bag at 09/29/23 0813   Physical Exam:   BP (!) 163/73   Pulse 62   SpO2 94%   Salient findings:  CN II-XII intact Given history and complaints, ear microscopy was indicated and performed for evaluation with findings as below in physical exam section and in procedures; bilateral ceruminous debris impaction; after clearance, left EAC clear with some persistent debris but able to visualize small perforation anteriorly; mild retraction posteriorly; on left, noted T-tube in place but completely plugged; modest retraction right tympanic membrane globally but no effusion Weber 512: right Rinne 512: AC > BC b/l but inconsistent Anterior rhinoscopy: Septum relatively midline; bilateral inferior turbinates without significant hypertrophy  Seprately Identifiable Procedures:  Procedure: Bilateral ear microscopy and cerumen removal using microscope (CPT  [phone]) - Mod 25 Pre-procedure diagnosis: Cerumen impaction bilateral external ears Post-procedure diagnosis: same Indication: bilateral ceruminous debris impaction; given patient's otologic complaints and history as well as for improved and comprehensive examination of external ear and tympanic membrane, bilateral otologic examination using microscope was performed and  impacted cerumen removed  Procedure: Patient was kept upright and on wheel chair. Both ear canals were examined using the microscope with findings above. Impacted Ceruminous debris removed on left and on right using suction and currette with improvement in EAC examination and patency. Patient tolerated the procedure well.  Impression & Plans:  Kathryn Beck is a 68 y.o. female with:  1. Mixed conductive and sensorineural hearing loss of right ear with restricted hearing of left ear   2. Dysfunction of both eustachian tubes   3. Sensation of fullness in both ears   4. Hx of tympanostomy tubes   5. Bilateral impacted cerumen    Slow improvement fullness and ETD sx (which she has a history of with bilateral tymp tubes last in 2019 by Dr. Karis) for ETD. Still has some debris in the ear but does have small perforation where tube was in place; on left, tube is plugged.  Given her medical co morbidities and difficulty examining in clinic, will continue ofloxacin  BID x4 weeks and start flonase  BID and she was taught to autoinsufflate - If still sx persist in 6 weeks, at that point can consider repeat tubes in OR  See below regarding exact medications prescribed this encounter including dosages and route: Meds ordered this encounter  Medications   ofloxacin  (FLOXIN ) 0.3 % OTIC solution    Sig: Place 4 drops into both ears 2 (two) times daily for 7 days.    Dispense:  10 mL    Refill:  1   fluticasone  (FLONASE ) 50 MCG/ACT nasal spray    Sig: Place 2 sprays into both nostrils in the morning and at bedtime.    Dispense:  16 g     Refill:  6      Thank you for allowing me the opportunity to care for your patient. Please do not hesitate to contact me should you have any other questions.  Sincerely, Eldora Blanch, MD Otolaryngologist (ENT), [facility] Phone: [phone] Fax: [phone]  10/05/2023, 12:27 PM   MDM:  Level 4: 99214 Complexity/Problems addressed: mod - multiple chronic problems Data complexity: low - Morbidity: mod  - Prescription Drug prescribed or managed: y

## 2023-10-06 DIAGNOSIS — Z992 Dependence on renal dialysis: Secondary | ICD-10-CM | POA: Diagnosis not present

## 2023-10-06 DIAGNOSIS — N2581 Secondary hyperparathyroidism of renal origin: Secondary | ICD-10-CM | POA: Diagnosis not present

## 2023-10-06 DIAGNOSIS — N186 End stage renal disease: Secondary | ICD-10-CM | POA: Diagnosis not present

## 2023-10-07 ENCOUNTER — Encounter: Payer: Self-pay | Admitting: Orthopedic Surgery

## 2023-10-07 ENCOUNTER — Ambulatory Visit (INDEPENDENT_AMBULATORY_CARE_PROVIDER_SITE_OTHER): Admitting: Orthopedic Surgery

## 2023-10-07 ENCOUNTER — Ambulatory Visit (HOSPITAL_BASED_OUTPATIENT_CLINIC_OR_DEPARTMENT_OTHER): Admitting: Pulmonary Disease

## 2023-10-07 DIAGNOSIS — I96 Gangrene, not elsewhere classified: Secondary | ICD-10-CM

## 2023-10-07 NOTE — Progress Notes (Signed)
 Office Visit Note   Patient: Kathryn Beck           Date of Birth: 1956-01-14           MRN: [id number] Visit Date: 10/07/2023              Requested by: Antonetta Rollene BRAVO, MD [address] [place],  [place] [address] PCP: Antonetta Rollene BRAVO, MD  Chief Complaint  Patient presents with   Left Hand - Routine Post Op    09/29/2023 left long finger amputation      HPI: Patient is a 68 year old woman who is 2 weeks status post amputation of the left long finger.  Patient states she no longer has pain.  Assessment & Plan: Visit Diagnoses:  1. 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  Follow-up in the office 1 week to harvest the sutures.  Follow-Up Instructions: No follow-ups on file.   Ortho Exam  Patient is alert, oriented, no adenopathy, well-dressed, normal affect, normal respiratory effort. Examination the incision is well-approximated there is no redness no cellulitis no drainage.  Patient has full range of motion of the fingers.    Imaging: No results found. No images are attached to the encounter.  Labs: Lab Results  Component Value Date   HGBA1C 8.4 (A) 04/20/2023   HGBA1C 7.7 (A) 12/15/2022   HGBA1C 6.3 (H) 07/07/2022   ESRSEDRATE 70 (H) 09/03/2022   ESRSEDRATE 79 (H) 08/13/2022   ESRSEDRATE 37 (H) 04/25/2022   CRP 0.9 09/03/2022   CRP 9.1 (H) 08/13/2022   CRP 29.4 (H) 04/25/2022   LABURIC 1.8 (L) 06/07/2018   LABURIC 6.2 04/22/2018   LABURIC 9.3 (H) 10/07/2015   REPTSTATUS 09/09/2022 FINAL 09/04/2022   GRAMSTAIN  09/04/2022    RARE WBC PRESENT, PREDOMINANTLY PMN NO ORGANISMS SEEN    CULT  09/04/2022    RARE ENTEROBACTER HORMAECHEI NO ANAEROBES ISOLATED Performed at [facility], [address], [place], [place] [address]    LABORGA ENTEROBACTER HORMAECHEI 09/04/2022     Lab Results  Component Value Date   ALBUMIN  3.1 (L) 12/28/2022   ALBUMIN  3.2 (L) 12/17/2022    ALBUMIN  3.8 (L) 12/15/2022   PREALBUMIN 26 08/13/2022   PREALBUMIN 24 04/25/2022    Lab Results  Component Value Date   MG 2.2 12/28/2022   MG 2.1 11/19/2022   MG 1.9 09/08/2022   Lab Results  Component Value Date   VD25OH 30.9 09/28/2017   VD25OH 15 (L) 10/07/2015   VD25OH 16 (L) 04/25/2015    Lab Results  Component Value Date   PREALBUMIN 26 08/13/2022   PREALBUMIN 24 04/25/2022      Latest Ref Rng & Units 09/29/2023    7:18 AM 05/02/2023    5:06 AM 04/30/2023    1:30 PM  CBC EXTENDED  WBC 4.0 - 10.5 K/uL  4.9  5.4   RBC 3.87 - 5.11 MIL/uL  4.49  4.36   Hemoglobin 12.0 - 15.0 g/dL 87.0  88.4  88.7   HCT 36.0 - 46.0 % 38.0  37.8  35.9   Platelets 150 - 400 K/uL  264  162   NEUT# 1.7 - 7.7 K/uL  3.9  3.7   Lymph# 0.7 - 4.0 K/uL  0.7  1.2      There is no height or weight on file to calculate BMI.  Orders:  No orders of  the defined types were placed in this encounter.  No orders of the defined types were placed in this encounter.    Procedures: No procedures performed  Clinical Data: No additional findings.  ROS:  All other systems negative, except as noted in the HPI. Review of Systems  Objective: Vital Signs: There were no vitals taken for this visit.  Specialty Comments:  No specialty comments available.  PMFS History: Patient Active Problem List   Diagnosis Date Noted   Mass of breast 08/30/2023   Depression, major, single episode, moderate (HCC) 08/30/2023   GAD (generalized anxiety disorder) 08/30/2023   Help needed for obtaining assistive device 08/30/2023   Decreased strength, endurance, and mobility 08/30/2023   Gangrene of finger of left hand (HCC) 08/30/2023   Uncontrolled type 2 diabetes mellitus with hyperglycemia (HCC) 08/30/2023   Diarrhea 05/04/2023   RSV (acute bronchiolitis due to respiratory syncytial virus) 04/30/2023   Hearing loss 04/27/2023   Otalgia of both ears 04/27/2023   Volume overload 12/28/2022   Fall at home  12/18/2022   CVA (cerebral vascular accident) (HCC) 12/17/2022   Acute confusion 12/10/2022   Poor appetite 12/10/2022   Chills 12/10/2022   Generalized weakness 11/18/2022   Paronychia of finger of right hand 11/13/2022   Dehiscence of amputation stump of left lower extremity (HCC) 10/07/2022   Status post below-knee amputation of left lower extremity (HCC) 10/07/2022   Subacute osteomyelitis, left ankle and foot (HCC) 09/03/2022   Left foot infection 09/03/2022   Amputated left leg (HCC) 09/03/2022   Wound infection after surgery 09/03/2022   Equinus contracture of left ankle 08/15/2022   Acute osteomyelitis of toe of left foot (HCC) 08/13/2022   Peripheral vascular complication 07/21/2022   PVD (peripheral vascular disease) (HCC) 07/18/2022   Toe ulcer, left, limited to breakdown of skin (HCC) 07/13/2022   S/P BKA (below knee amputation), right (HCC) 05/29/2022   Confusion 05/19/2022   Visual hallucinations 05/19/2022   Watery eyes 05/19/2022   Requires assistance with activities of daily living (ADL) 04/26/2022   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   Gangrene of right foot (HCC) 04/25/2022   PAD (peripheral artery disease) (HCC) 04/25/2022   Other osteoporosis without current pathological fracture 11/10/2021   Hypocalcemia 11/10/2021   Fall 11/02/2021   Left foot pain 10/31/2021   Low back pain with left-sided sciatica 10/31/2021   Left ear impacted cerumen 09/18/2021   Chronic left shoulder pain 06/10/2021   Abnormal CXR 03/10/2021   Ear pain, bilateral 03/10/2021   Morbid obesity (HCC) 03/10/2021   Subungual hematoma of second toe of left foot 10/28/2020   Insomnia 10/28/2020   Memory loss or impairment 09/26/2020   Forgetfulness 09/26/2020   Recurrent vertigo 09/26/2020   Abnormal MRI, cervical spine 03/04/2020   Right leg weakness 02/28/2020   Knee pain, right 10/23/2019   Cough 08/09/2019   Carpal tunnel syndrome 06/13/2019   Chronic pain syndrome  06/13/2019   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) 06/13/2019   Encounter for support and coordination of transition of care 04/14/2019   Neck pain 03/23/2019   Near syncope 12/05/2018   ESRD on hemodialysis (HCC) 11/22/2018   Ischemic heart disease 09/25/2018   Not currently working due to disabled status 06/13/2018   HTN (hypertension) 06/12/2018   Depression 03/09/2018   Adrenal mass, left (HCC) 11/09/2017   MGUS (monoclonal gammopathy of unknown significance) 11/05/2017   Shoulder pain 11/01/2017   Chronic diastolic (congestive) heart failure (  HCC) 09/20/2017   Bilateral leg weakness 09/09/2017   Adrenal adenoma 09/03/2017   Unsteady gait 07/11/2017   Recurrent falls 07/11/2017   Anemia of chronic disease 03/24/2017   History of colonic polyps 02/10/2017   Dysphagia 11/05/2016   Irritable bowel syndrome 02/04/2016   Hospital discharge follow-up 10/27/2015   Intolerant of heat 10/07/2014   Cardiac murmur 01/17/2014   Back pain with left-sided radiculopathy 09/18/2013   Seasonal allergies 03/10/2013   Lipoma of back 12/22/2012   Other seasonal allergic rhinitis 08/22/2012   Bronchitis 12/01/2011   OSA (obstructive sleep apnea) 06/02/2011   Chronic kidney disease, stage 4 (severe) (HCC) 01/13/2011   Neuropathy 04/16/2010   Malaise and fatigue 07/24/2009   Personal history of other diseases of the nervous system and sense organs 05/08/2009   LUPUS ERYTHEMATOSUS, DISCOID 06/05/2008   Arm pain 05/30/2008   Hyperlipidemia 06/07/2007   Obesity 06/07/2007   Malignant hypertension 06/07/2007   Type 2 diabetes mellitus with hyperglycemia (HCC) 06/07/2007   Diabetes mellitus (HCC) 06/07/2007   Past Medical History:  Diagnosis Date   Acid reflux    Amputated left leg (HCC)    bilateral BKA   Anemia    Arthritis    Axillary masses    Soft tissue - status post excision   Back pain    CHF (congestive heart failure) (HCC)    COVID-19 virus infection  04/06/2019   Depression    End-stage renal disease (HCC)    M/W/F dialysis   Essential hypertension    Headache    years ago   History of blood transfusion    History of cardiac catheterization    Normal coronary arteries October 2020   History of claustrophobia    History of pneumonia 2019   Hypoxia 04/03/2019   Memory loss    Mixed hyperlipidemia    Obesity    Pancreatitis    Peritoneal dialysis catheter in place [facility])    Pneumonia due to COVID-19 virus 04/02/2019   Sleep apnea    Noncompliant with CPAP   Stroke (HCC)    mini stroke   Type 2 diabetes mellitus (HCC)     Family History  Problem Relation Age of Onset   Hypertension Father    Hypercholesterolemia Father    Arthritis Father    Hypertension Sister    Hypercholesterolemia Sister    Breast cancer Sister    Hypertension Sister    Colon cancer Neg Hx    Colon polyps Neg Hx     Past Surgical History:  Procedure Laterality Date   ABDOMINAL AORTOGRAM W/LOWER EXTREMITY N/A 04/30/2022   Procedure: ABDOMINAL AORTOGRAM W/LOWER EXTREMITY;  Surgeon: Magda Debby SAILOR, MD;  Location: [facility];  Service: Cardiovascular;  Laterality: N/A;   ABDOMINAL AORTOGRAM W/LOWER EXTREMITY N/A 07/21/2022   Procedure: ABDOMINAL AORTOGRAM W/LOWER EXTREMITY;  Surgeon: Serene Gaile ORN, MD;  Location: [facility];  Service: Cardiovascular;  Laterality: N/A;   ABDOMINAL HYSTERECTOMY     ACHILLES TENDON LENGTHENING  08/15/2022   Procedure: ACHILLES TENDON LENGTHENING;  Surgeon: Silva Juliene SAUNDERS, DPM;  Location: [facility] OR;  Service: Podiatry;;   AMPUTATION Right 05/29/2022   Procedure: RIGHT BELOW THE KNEE AMPUTATION;  Surgeon: Harden Jerona GAILS, MD;  Location: [facility] OR;  Service: Orthopedics;  Laterality: Right;   AMPUTATION Left 09/04/2022   Procedure: AMPUTATION FOOT, serial irrigation;  Surgeon: Joya Stabs, DPM;  Location: [facility] OR;  Service: Podiatry;  Laterality: Left;  Surgical team to do block  AMPUTATION Left 10/07/2022    Procedure: LEFT BELOW KNEE AMPUTATION;  Surgeon: Harden Jerona GAILS, MD;  Location: [facility] OR;  Service: Orthopedics;  Laterality: Left;   AMPUTATION FINGER Left 09/29/2023   Procedure: AMPUTATION, FINGER;  Surgeon: Harden Jerona GAILS, MD;  Location: [facility] OR;  Service: Orthopedics;  Laterality: Left;  LEFT HAND LONG FINGER RAY AMPUTATION   AV FISTULA PLACEMENT Left 09/02/2017   Procedure: creation of left arm ARTERIOVENOUS (AV) FISTULA;  Surgeon: Serene Gaile ORN, MD;  Location: [facility] OR;  Service: Vascular;  Laterality: Left;   COLONOSCOPY  2008   Dr. Harvey: normal    COLONOSCOPY N/A 12/18/2016   Dr. Harvey: multiple tubular adenomas, internal hemorrhoids. Surveillance in 3 years    ESOPHAGEAL DILATION N/A 10/13/2015   Procedure: ESOPHAGEAL DILATION;  Surgeon: Claudis RAYMOND Rivet, MD;  Location: [facility];  Service: Endoscopy;  Laterality: N/A;   ESOPHAGOGASTRODUODENOSCOPY N/A 10/13/2015   Dr. Rivet: chronic gastritis on path, no H.pylori. Empiric dilation    ESOPHAGOGASTRODUODENOSCOPY N/A 12/18/2016   Dr. Harvey: mild gastritis. BRAVO study revealed uncontrolled GERD. Dysphagia secondary to uncontrolled reflux   FOOT SURGERY Bilateral    nerve     LEFT HEART CATH AND CORONARY ANGIOGRAPHY N/A 12/29/2018   Procedure: LEFT HEART CATH AND CORONARY ANGIOGRAPHY;  Surgeon: Dann Candyce RAMAN, MD;  Location: [facility] CV LAB;  Service: Cardiovascular;  Laterality: N/A;   LOWER EXTREMITY ANGIOGRAPHY Right 05/04/2022   Procedure: Lower Extremity Angiography;  Surgeon: Lanis Fonda BRAVO, MD;  Location: [facility] INVAS[facility] CV LAB;  Service: Cardiovascular;  Laterality: Right;   LUNG BIOPSY     MASS EXCISION Right 01/09/2013   Procedure: EXCISION OF NEOPLASM OF RIGHT  AXILLA  AND EXCISION OF NEOPLASM OF LEFT AXILLA;  Surgeon: Oneil DELENA Budge, MD;  Location: [facility];  Service: General;  Laterality: Right;  procedure end @ 08:23   MYRINGOTOMY WITH TUBE PLACEMENT Bilateral 04/28/2017   Procedure: BILATERAL MYRINGOTOMY WITH TUBE  PLACEMENT;  Surgeon: Karis Clunes, MD;  Location: [facility] OR;  Service: ENT;  Laterality: Bilateral;   PERIPHERAL VASCULAR BALLOON ANGIOPLASTY Right 05/04/2022   Procedure: PERIPHERAL VASCULAR BALLOON ANGIOPLASTY;  Surgeon: Lanis Fonda BRAVO, MD;  Location: [facility] INVASIVE CV LAB;  Service: Cardiovascular;  Laterality: Right;  PT   PERIPHERAL VASCULAR INTERVENTION Right 05/04/2022   Procedure: PERIPHERAL VASCULAR INTERVENTION;  Surgeon: Lanis Fonda BRAVO, MD;  Location: [facility] INVASIVE CV LAB;  Service: Cardiovascular;  Laterality: Right;  SFA   PERIPHERAL VASCULAR INTERVENTION Left 07/21/2022   Procedure: PERIPHERAL VASCULAR INTERVENTION;  Surgeon: Serene Gaile ORN, MD;  Location: [facility];  Service: Cardiovascular;  Laterality: Left;   REVISION OF ARTERIOVENOUS GORETEX GRAFT Left 05/04/2018   Procedure: TRANSPOSITION OF CEPHALIC VEIN ARTERIOVENOUS FISTULA LEFT ARM;  Surgeon: Oris Krystal FALCON, MD;  Location: [facility] OR;  Service: Vascular;  Laterality: Left;   SAVORY DILATION N/A 12/18/2016   Procedure: SAVORY DILATION;  Surgeon: Harvey Margo CROME, MD;  [facility]: [facility];  Service: Endoscopy;  Laterality: N/A;   TRANSMETATARSAL AMPUTATION Left 08/15/2022   Procedure: TRANSMETATARSAL AMPUTATION;  Surgeon: Silva Juliene SAUNDERS, DPM;  Location: [facility] OR;  Service: Podiatry;  Laterality: Left;   Social History   Occupational History   Occupation: retired   Tobacco Use   Smoking status: Never    Passive exposure: Never   Smokeless tobacco: Never   Tobacco comments:    Verified by Daughter, [name]  Vaping Use   Vaping status: Never Used  Substance and Sexual  Activity   Alcohol use: No   Drug use: No   Sexual activity: Not Currently    Partners: Male

## 2023-10-08 DIAGNOSIS — Z992 Dependence on renal dialysis: Secondary | ICD-10-CM | POA: Diagnosis not present

## 2023-10-08 DIAGNOSIS — N2581 Secondary hyperparathyroidism of renal origin: Secondary | ICD-10-CM | POA: Diagnosis not present

## 2023-10-08 DIAGNOSIS — N186 End stage renal disease: Secondary | ICD-10-CM | POA: Diagnosis not present

## 2023-10-11 DIAGNOSIS — N186 End stage renal disease: Secondary | ICD-10-CM | POA: Diagnosis not present

## 2023-10-11 DIAGNOSIS — Z992 Dependence on renal dialysis: Secondary | ICD-10-CM | POA: Diagnosis not present

## 2023-10-11 DIAGNOSIS — N2581 Secondary hyperparathyroidism of renal origin: Secondary | ICD-10-CM | POA: Diagnosis not present

## 2023-10-12 DIAGNOSIS — G4733 Obstructive sleep apnea (adult) (pediatric): Secondary | ICD-10-CM | POA: Diagnosis not present

## 2023-10-13 ENCOUNTER — Telehealth (HOSPITAL_COMMUNITY): Payer: Self-pay

## 2023-10-13 DIAGNOSIS — N2581 Secondary hyperparathyroidism of renal origin: Secondary | ICD-10-CM | POA: Diagnosis not present

## 2023-10-13 DIAGNOSIS — N186 End stage renal disease: Secondary | ICD-10-CM | POA: Diagnosis not present

## 2023-10-13 DIAGNOSIS — Z992 Dependence on renal dialysis: Secondary | ICD-10-CM | POA: Diagnosis not present

## 2023-10-13 NOTE — Telephone Encounter (Signed)
 Referral populated back to work queue. BIV was completed and insurance is active. Leqvio  shara is good until 06/24/24.

## 2023-10-14 ENCOUNTER — Ambulatory Visit (HOSPITAL_BASED_OUTPATIENT_CLINIC_OR_DEPARTMENT_OTHER): Admitting: Pulmonary Disease

## 2023-10-14 ENCOUNTER — Ambulatory Visit: Admitting: Physician Assistant

## 2023-10-14 DIAGNOSIS — I96 Gangrene, not elsewhere classified: Secondary | ICD-10-CM

## 2023-10-14 DIAGNOSIS — L089 Local infection of the skin and subcutaneous tissue, unspecified: Secondary | ICD-10-CM

## 2023-10-15 ENCOUNTER — Encounter: Payer: Self-pay | Admitting: Physician Assistant

## 2023-10-15 DIAGNOSIS — N2581 Secondary hyperparathyroidism of renal origin: Secondary | ICD-10-CM | POA: Diagnosis not present

## 2023-10-15 DIAGNOSIS — Z992 Dependence on renal dialysis: Secondary | ICD-10-CM | POA: Diagnosis not present

## 2023-10-15 DIAGNOSIS — N186 End stage renal disease: Secondary | ICD-10-CM | POA: Diagnosis not present

## 2023-10-15 NOTE — Progress Notes (Signed)
 Office Visit Note   Patient: Kathryn Beck           Date of Birth: 1955/12/29           MRN: [id number] Visit Date: 10/14/2023              Requested by: Antonetta Rollene BRAVO, MD [address] [place],  [place] [address] PCP: Antonetta Rollene BRAVO, MD  Chief Complaint  Patient presents with   Left Hand - Routine Post Op    09/29/2023 left long finger amputation      HPI: Patient is a 68 year old woman who is 3 weeks status post amputation of the left long finger. Patient states she no longer has pain. She is pleased with her recovery.    Assessment & Plan: Visit Diagnoses:  1. Finger infection   2. 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  Gradually increase the use of the left hand.    Follow-Up Instructions: Return in about 2 weeks (around 10/28/2023).   Ortho Exam  Patient is alert, oriented, no adenopathy, well-dressed, normal affect, normal respiratory effort. Well healed left middle finger amputation.  Mild dorsal dorsal hand ecchymosis, no ischemic skin changes.  Motor intact of residual fingers.  Palpable radial pulse.  Sutures removed today patient tolerated this well.    Imaging: No results found.    Labs: Lab Results  Component Value Date   HGBA1C 8.4 (A) 04/20/2023   HGBA1C 7.7 (A) 12/15/2022   HGBA1C 6.3 (H) 07/07/2022   ESRSEDRATE 70 (H) 09/03/2022   ESRSEDRATE 79 (H) 08/13/2022   ESRSEDRATE 37 (H) 04/25/2022   CRP 0.9 09/03/2022   CRP 9.1 (H) 08/13/2022   CRP 29.4 (H) 04/25/2022   LABURIC 1.8 (L) 06/07/2018   LABURIC 6.2 04/22/2018   LABURIC 9.3 (H) 10/07/2015   REPTSTATUS 09/09/2022 FINAL 09/04/2022   GRAMSTAIN  09/04/2022    RARE WBC PRESENT, PREDOMINANTLY PMN NO ORGANISMS SEEN    CULT  09/04/2022    RARE ENTEROBACTER HORMAECHEI NO ANAEROBES ISOLATED Performed at [facility], [address], [place], [place] [address]    LABORGA ENTEROBACTER HORMAECHEI 09/04/2022     Lab  Results  Component Value Date   ALBUMIN  3.1 (L) 12/28/2022   ALBUMIN  3.2 (L) 12/17/2022   ALBUMIN  3.8 (L) 12/15/2022   PREALBUMIN 26 08/13/2022   PREALBUMIN 24 04/25/2022    Lab Results  Component Value Date   MG 2.2 12/28/2022   MG 2.1 11/19/2022   MG 1.9 09/08/2022   Lab Results  Component Value Date   VD25OH 30.9 09/28/2017   VD25OH 15 (L) 10/07/2015   VD25OH 16 (L) 04/25/2015    Lab Results  Component Value Date   PREALBUMIN 26 08/13/2022   PREALBUMIN 24 04/25/2022      Latest Ref Rng & Units 09/29/2023    7:18 AM 05/02/2023    5:06 AM 04/30/2023    1:30 PM  CBC EXTENDED  WBC 4.0 - 10.5 K/uL  4.9  5.4   RBC 3.87 - 5.11 MIL/uL  4.49  4.36   Hemoglobin 12.0 - 15.0 g/dL 87.0  88.4  88.7   HCT 36.0 - 46.0 % 38.0  37.8  35.9   Platelets 150 - 400 K/uL  264  162   NEUT# 1.7 - 7.7 K/uL  3.9  3.7   Lymph# 0.7 - 4.0 K/uL  0.7  1.2  There is no height or weight on file to calculate BMI.  Orders:  No orders of the defined types were placed in this encounter.  No orders of the defined types were placed in this encounter.    Procedures: No procedures performed  Clinical Data: No additional findings.  ROS:  All other systems negative, except as noted in the HPI. Review of Systems  Objective: Vital Signs: There were no vitals taken for this visit.  Specialty Comments:  No specialty comments available.  PMFS History: Patient Active Problem List   Diagnosis Date Noted   Mass of breast 08/30/2023   Depression, major, single episode, moderate (HCC) 08/30/2023   GAD (generalized anxiety disorder) 08/30/2023   Help needed for obtaining assistive device 08/30/2023   Decreased strength, endurance, and mobility 08/30/2023   Gangrene of finger of left hand (HCC) 08/30/2023   Uncontrolled type 2 diabetes mellitus with hyperglycemia (HCC) 08/30/2023   Diarrhea 05/04/2023   RSV (acute bronchiolitis due to respiratory syncytial virus) 04/30/2023   Hearing loss  04/27/2023   Otalgia of both ears 04/27/2023   Volume overload 12/28/2022   Fall at home 12/18/2022   CVA (cerebral vascular accident) (HCC) 12/17/2022   Acute confusion 12/10/2022   Poor appetite 12/10/2022   Chills 12/10/2022   Generalized weakness 11/18/2022   Paronychia of finger of right hand 11/13/2022   Dehiscence of amputation stump of left lower extremity (HCC) 10/07/2022   Status post below-knee amputation of left lower extremity (HCC) 10/07/2022   Subacute osteomyelitis, left ankle and foot (HCC) 09/03/2022   Left foot infection 09/03/2022   Amputated left leg (HCC) 09/03/2022   Wound infection after surgery 09/03/2022   Equinus contracture of left ankle 08/15/2022   Acute osteomyelitis of toe of left foot (HCC) 08/13/2022   Peripheral vascular complication 07/21/2022   PVD (peripheral vascular disease) (HCC) 07/18/2022   Toe ulcer, left, limited to breakdown of skin (HCC) 07/13/2022   S/P BKA (below knee amputation), right (HCC) 05/29/2022   Confusion 05/19/2022   Visual hallucinations 05/19/2022   Watery eyes 05/19/2022   Requires assistance with activities of daily living (ADL) 04/26/2022   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   Gangrene of right foot (HCC) 04/25/2022   PAD (peripheral artery disease) (HCC) 04/25/2022   Other osteoporosis without current pathological fracture 11/10/2021   Hypocalcemia 11/10/2021   Fall 11/02/2021   Left foot pain 10/31/2021   Low back pain with left-sided sciatica 10/31/2021   Left ear impacted cerumen 09/18/2021   Chronic left shoulder pain 06/10/2021   Abnormal CXR 03/10/2021   Ear pain, bilateral 03/10/2021   Morbid obesity (HCC) 03/10/2021   Subungual hematoma of second toe of left foot 10/28/2020   Insomnia 10/28/2020   Memory loss or impairment 09/26/2020   Forgetfulness 09/26/2020   Recurrent vertigo 09/26/2020   Abnormal MRI, cervical spine 03/04/2020   Right leg weakness 02/28/2020   Knee pain, right  10/23/2019   Cough 08/09/2019   Carpal tunnel syndrome 06/13/2019   Chronic pain syndrome 06/13/2019   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) 06/13/2019   Encounter for support and coordination of transition of care 04/14/2019   Neck pain 03/23/2019   Near syncope 12/05/2018   ESRD on hemodialysis (HCC) 11/22/2018   Ischemic heart disease 09/25/2018   Not currently working due to disabled status 06/13/2018   HTN (hypertension) 06/12/2018   Depression 03/09/2018   Adrenal mass, left (HCC) 11/09/2017   MGUS (monoclonal  gammopathy of unknown significance) 11/05/2017   Shoulder pain 11/01/2017   Chronic diastolic (congestive) heart failure (HCC) 09/20/2017   Bilateral leg weakness 09/09/2017   Adrenal adenoma 09/03/2017   Unsteady gait 07/11/2017   Recurrent falls 07/11/2017   Anemia of chronic disease 03/24/2017   History of colonic polyps 02/10/2017   Dysphagia 11/05/2016   Irritable bowel syndrome 02/04/2016   Hospital discharge follow-up 10/27/2015   Intolerant of heat 10/07/2014   Cardiac murmur 01/17/2014   Back pain with left-sided radiculopathy 09/18/2013   Seasonal allergies 03/10/2013   Lipoma of back 12/22/2012   Other seasonal allergic rhinitis 08/22/2012   Bronchitis 12/01/2011   OSA (obstructive sleep apnea) 06/02/2011   Chronic kidney disease, stage 4 (severe) (HCC) 01/13/2011   Neuropathy 04/16/2010   Malaise and fatigue 07/24/2009   Personal history of other diseases of the nervous system and sense organs 05/08/2009   LUPUS ERYTHEMATOSUS, DISCOID 06/05/2008   Arm pain 05/30/2008   Hyperlipidemia 06/07/2007   Obesity 06/07/2007   Malignant hypertension 06/07/2007   Type 2 diabetes mellitus with hyperglycemia (HCC) 06/07/2007   Diabetes mellitus (HCC) 06/07/2007   Past Medical History:  Diagnosis Date   Acid reflux    Amputated left leg (HCC)    bilateral BKA   Anemia    Arthritis    Axillary masses    Soft tissue - status post  excision   Back pain    CHF (congestive heart failure) (HCC)    COVID-19 virus infection 04/06/2019   Depression    End-stage renal disease (HCC)    M/W/F dialysis   Essential hypertension    Headache    years ago   History of blood transfusion    History of cardiac catheterization    Normal coronary arteries October 2020   History of claustrophobia    History of pneumonia 2019   Hypoxia 04/03/2019   Memory loss    Mixed hyperlipidemia    Obesity    Pancreatitis    Peritoneal dialysis catheter in place [facility])    Pneumonia due to COVID-19 virus 04/02/2019   Sleep apnea    Noncompliant with CPAP   Stroke (HCC)    mini stroke   Type 2 diabetes mellitus (HCC)     Family History  Problem Relation Age of Onset   Hypertension Father    Hypercholesterolemia Father    Arthritis Father    Hypertension Sister    Hypercholesterolemia Sister    Breast cancer Sister    Hypertension Sister    Colon cancer Neg Hx    Colon polyps Neg Hx     Past Surgical History:  Procedure Laterality Date   ABDOMINAL AORTOGRAM W/LOWER EXTREMITY N/A 04/30/2022   Procedure: ABDOMINAL AORTOGRAM W/LOWER EXTREMITY;  Surgeon: Magda Debby SAILOR, MD;  Location: [facility];  Service: Cardiovascular;  Laterality: N/A;   ABDOMINAL AORTOGRAM W/LOWER EXTREMITY N/A 07/21/2022   Procedure: ABDOMINAL AORTOGRAM W/LOWER EXTREMITY;  Surgeon: Serene Gaile ORN, MD;  Location: [facility];  Service: Cardiovascular;  Laterality: N/A;   ABDOMINAL HYSTERECTOMY     ACHILLES TENDON LENGTHENING  08/15/2022   Procedure: ACHILLES TENDON LENGTHENING;  Surgeon: Silva Juliene SAUNDERS, DPM;  Location: [facility] OR;  Service: Podiatry;;   AMPUTATION Right 05/29/2022   Procedure: RIGHT BELOW THE KNEE AMPUTATION;  Surgeon: Harden Jerona GAILS, MD;  Location: [facility] OR;  Service: Orthopedics;  Laterality: Right;   AMPUTATION Left 09/04/2022   Procedure: AMPUTATION FOOT, serial irrigation;  Surgeon: Joya Stabs, DPM;  Location: [facility] OR;  Service:  Podiatry;  Laterality: Left;  Surgical team to do block   AMPUTATION Left 10/07/2022   Procedure: LEFT BELOW KNEE AMPUTATION;  Surgeon: Harden Jerona GAILS, MD;  Location: [facility] OR;  Service: Orthopedics;  Laterality: Left;   AMPUTATION FINGER Left 09/29/2023   Procedure: AMPUTATION, FINGER;  Surgeon: Harden Jerona GAILS, MD;  Location: [facility] OR;  Service: Orthopedics;  Laterality: Left;  LEFT HAND LONG FINGER RAY AMPUTATION   AV FISTULA PLACEMENT Left 09/02/2017   Procedure: creation of left arm ARTERIOVENOUS (AV) FISTULA;  Surgeon: Serene Gaile ORN, MD;  Location: [facility] OR;  Service: Vascular;  Laterality: Left;   COLONOSCOPY  2008   Dr. Harvey: normal    COLONOSCOPY N/A 12/18/2016   Dr. Harvey: multiple tubular adenomas, internal hemorrhoids. Surveillance in 3 years    ESOPHAGEAL DILATION N/A 10/13/2015   Procedure: ESOPHAGEAL DILATION;  Surgeon: Claudis RAYMOND Rivet, MD;  Location: [facility];  Service: Endoscopy;  Laterality: N/A;   ESOPHAGOGASTRODUODENOSCOPY N/A 10/13/2015   Dr. Rivet: chronic gastritis on path, no H.pylori. Empiric dilation    ESOPHAGOGASTRODUODENOSCOPY N/A 12/18/2016   Dr. Harvey: mild gastritis. BRAVO study revealed uncontrolled GERD. Dysphagia secondary to uncontrolled reflux   FOOT SURGERY Bilateral    nerve     LEFT HEART CATH AND CORONARY ANGIOGRAPHY N/A 12/29/2018   Procedure: LEFT HEART CATH AND CORONARY ANGIOGRAPHY;  Surgeon: Dann Candyce RAMAN, MD;  Location: [facility] INVASIVE CV LAB;  Service: Cardiovascular;  Laterality: N/A;   LOWER EXTREMITY ANGIOGRAPHY Right 05/04/2022   Procedure: Lower Extremity Angiography;  Surgeon: Lanis Fonda BRAVO, MD;  Location: [facility] INVASIVE CV LAB;  Service: Cardiovascular;  Laterality: Right;   LUNG BIOPSY     MASS EXCISION Right 01/09/2013   Procedure: EXCISION OF NEOPLASM OF RIGHT  AXILLA  AND EXCISION OF NEOPLASM OF LEFT AXILLA;  Surgeon: Oneil DELENA Budge, MD;  Location: [facility];  Service: General;  Laterality: Right;  procedure end @ 08:23   MYRINGOTOMY WITH  TUBE PLACEMENT Bilateral 04/28/2017   Procedure: BILATERAL MYRINGOTOMY WITH TUBE PLACEMENT;  Surgeon: Karis Clunes, MD;  Location: [facility] OR;  Service: ENT;  Laterality: Bilateral;   PERIPHERAL VASCULAR BALLOON ANGIOPLASTY Right 05/04/2022   Procedure: PERIPHERAL VASCULAR BALLOON ANGIOPLASTY;  Surgeon: Lanis Fonda BRAVO, MD;  Location: [facility] INVASIVE CV LAB;  Service: Cardiovascular;  Laterality: Right;  PT   PERIPHERAL VASCULAR INTERVENTION Right 05/04/2022   Procedure: PERIPHERAL VASCULAR INTERVENTION;  Surgeon: Lanis Fonda BRAVO, MD;  Location: [facility] INVASIVE CV LAB;  Service: Cardiovascular;  Laterality: Right;  SFA   PERIPHERAL VASCULAR INTERVENTION Left 07/21/2022   Procedure: PERIPHERAL VASCULAR INTERVENTION;  Surgeon: Serene Gaile ORN, MD;  [facility]: [facility];  Service: Cardiovascular;  Laterality: Left;   REVISION OF ARTERIOVENOUS GORETEX GRAFT Left 05/04/2018   Procedure: TRANSPOSITION OF CEPHALIC VEIN ARTERIOVENOUS FISTULA LEFT ARM;  Surgeon: Oris Krystal FALCON, MD;  Location: [facility] OR;  Service: Vascular;  Laterality: Left;   SAVORY DILATION N/A 12/18/2016   Procedure: SAVORY DILATION;  Surgeon: Harvey Margo CROME, MD;  [facility];  Service: Endoscopy;  Laterality: N/A;   TRANSMETATARSAL AMPUTATION Left 08/15/2022   Procedure: TRANSMETATARSAL AMPUTATION;  Surgeon: Silva Juliene SAUNDERS, DPM;  Location: [facility] OR;  Service: Podiatry;  Laterality: Left;   Social History   Occupational History   Occupation: retired   Tobacco Use   Smoking status: Never    Passive exposure: Never   Smokeless tobacco: Never   Tobacco comments:    Verified  by Daughter, [name]  Vaping Use   Vaping status: Never Used  Substance and Sexual Activity   Alcohol use: No   Drug use: No   Sexual activity: Not Currently    Partners: Male

## 2023-10-18 DIAGNOSIS — Z992 Dependence on renal dialysis: Secondary | ICD-10-CM | POA: Diagnosis not present

## 2023-10-18 DIAGNOSIS — N186 End stage renal disease: Secondary | ICD-10-CM | POA: Diagnosis not present

## 2023-10-18 DIAGNOSIS — N2581 Secondary hyperparathyroidism of renal origin: Secondary | ICD-10-CM | POA: Diagnosis not present

## 2023-10-19 ENCOUNTER — Other Ambulatory Visit: Payer: Self-pay | Admitting: Family Medicine

## 2023-10-19 NOTE — Telephone Encounter (Unsigned)
 Copied from CRM [id number]. Topic: Clinical - Medication Refill >> Oct 19, 2023  2:49 PM Zebedee SAUNDERS wrote: Medication: oxyCODONE -acetaminophen  (PERCOCET/ROXICET) 5-325 MG tablet  Has the patient contacted their pharmacy? Yes (Agent: If no, request that the patient contact the pharmacy for the refill. If patient does not wish to contact the pharmacy document the reason why and proceed with request.) (Agent: If yes, when and what did the pharmacy advise?)  This is the patient's preferred pharmacy:  [facility] - [place], [place] - [address] [place] [address] Phone: [phone] Fax: [phone]  Is this the correct pharmacy for this prescription? Yes If no, delete pharmacy and type the correct one.   Has the prescription been filled recently? Yes  Is the patient out of the medication? Yes  Has the patient been seen for an appointment in the last year OR does the patient have an upcoming appointment? Yes  Can we respond through MyChart? Yes  Agent: Please be advised that Rx refills may take up to 3 business days. We ask that you follow-up with your pharmacy.

## 2023-10-20 ENCOUNTER — Other Ambulatory Visit (HOSPITAL_BASED_OUTPATIENT_CLINIC_OR_DEPARTMENT_OTHER): Payer: Self-pay | Admitting: Family

## 2023-10-20 ENCOUNTER — Telehealth: Payer: Self-pay

## 2023-10-20 ENCOUNTER — Other Ambulatory Visit: Payer: Self-pay | Admitting: Neurology

## 2023-10-20 ENCOUNTER — Other Ambulatory Visit: Payer: Self-pay | Admitting: Family Medicine

## 2023-10-20 DIAGNOSIS — N2581 Secondary hyperparathyroidism of renal origin: Secondary | ICD-10-CM | POA: Diagnosis not present

## 2023-10-20 DIAGNOSIS — N186 End stage renal disease: Secondary | ICD-10-CM | POA: Diagnosis not present

## 2023-10-20 DIAGNOSIS — Z992 Dependence on renal dialysis: Secondary | ICD-10-CM | POA: Diagnosis not present

## 2023-10-20 DIAGNOSIS — I10 Essential (primary) hypertension: Secondary | ICD-10-CM

## 2023-10-20 NOTE — Telephone Encounter (Signed)
 Pt daughter informed

## 2023-10-20 NOTE — Telephone Encounter (Signed)
 Copied from CRM [id number]. Topic: Clinical - Prescription Issue >> Oct 20, 2023  8:16 AM Donna BRAVO wrote: Reason for CRM: patient calling asking about prescription for electric scooter, patient needs a prescription so insurance will pay for it,   Patient stated this is an urgent request   Patient asking about medication refill status for oxyCODONE -acetaminophen  (PERCOCET/ROXICET) 5-325 MG tablet medication is pending  patient is out of medication  Patient was informed  time frame for medication refills  is 3 business days.

## 2023-10-20 NOTE — Telephone Encounter (Signed)
 Yes to the electric scooter, this was discussed at last visit if I recall. She needs to contact ORTHOPEDICS who has been prescribing the oxycodone , I reviewed and she has been getting on a regular from that office , need to stick with one prescriber for safety pls let her know

## 2023-10-21 ENCOUNTER — Other Ambulatory Visit: Payer: Self-pay

## 2023-10-21 DIAGNOSIS — S88912S Complete traumatic amputation of left lower leg, level unspecified, sequela: Secondary | ICD-10-CM

## 2023-10-21 DIAGNOSIS — N186 End stage renal disease: Secondary | ICD-10-CM | POA: Diagnosis not present

## 2023-10-21 DIAGNOSIS — Z89511 Acquired absence of right leg below knee: Secondary | ICD-10-CM

## 2023-10-21 DIAGNOSIS — Z992 Dependence on renal dialysis: Secondary | ICD-10-CM | POA: Diagnosis not present

## 2023-10-21 MED ORDER — UNABLE TO FIND: 0 refills | Status: DC

## 2023-10-22 ENCOUNTER — Telehealth (HOSPITAL_BASED_OUTPATIENT_CLINIC_OR_DEPARTMENT_OTHER): Payer: Self-pay

## 2023-10-22 DIAGNOSIS — N186 End stage renal disease: Secondary | ICD-10-CM | POA: Diagnosis not present

## 2023-10-22 DIAGNOSIS — L03114 Cellulitis of left upper limb: Secondary | ICD-10-CM | POA: Diagnosis not present

## 2023-10-22 DIAGNOSIS — N2581 Secondary hyperparathyroidism of renal origin: Secondary | ICD-10-CM | POA: Diagnosis not present

## 2023-10-22 DIAGNOSIS — Z992 Dependence on renal dialysis: Secondary | ICD-10-CM | POA: Diagnosis not present

## 2023-10-22 NOTE — Telephone Encounter (Signed)
 BIPAP setting CMN received and faxed back confirmation received

## 2023-10-25 DIAGNOSIS — L03114 Cellulitis of left upper limb: Secondary | ICD-10-CM | POA: Diagnosis not present

## 2023-10-25 DIAGNOSIS — N2581 Secondary hyperparathyroidism of renal origin: Secondary | ICD-10-CM | POA: Diagnosis not present

## 2023-10-25 DIAGNOSIS — S88912A Complete traumatic amputation of left lower leg, level unspecified, initial encounter: Secondary | ICD-10-CM | POA: Diagnosis not present

## 2023-10-25 DIAGNOSIS — I96 Gangrene, not elsewhere classified: Secondary | ICD-10-CM | POA: Diagnosis not present

## 2023-10-25 DIAGNOSIS — N186 End stage renal disease: Secondary | ICD-10-CM | POA: Diagnosis not present

## 2023-10-25 DIAGNOSIS — Z992 Dependence on renal dialysis: Secondary | ICD-10-CM | POA: Diagnosis not present

## 2023-10-26 ENCOUNTER — Ambulatory Visit: Payer: Self-pay

## 2023-10-26 ENCOUNTER — Telehealth: Payer: Self-pay

## 2023-10-26 NOTE — Telephone Encounter (Signed)
 Lvm to cb, Elctric scooter was ordered from [facility] [facility] [facility] [facility] [employer] [facility]. Have not heard back at this time

## 2023-10-26 NOTE — Telephone Encounter (Signed)
 Copied from CRM [id number]. Topic: Clinical - Order For Equipment >> Oct 26, 2023  1:31 PM Wess RAMAN wrote: Reason for CRM: Patient would like to know the status of her electric scooter. She stated she put in an order.   Callback #: [phone]

## 2023-10-26 NOTE — Telephone Encounter (Signed)
 Lvm to cb

## 2023-10-26 NOTE — Telephone Encounter (Signed)
 RN attempted to contact patient to discuss concerns x 3. Will route to office as she is requesting Dr. Antonetta to call in cream for butt pain to prevent bed sores.

## 2023-10-26 NOTE — Telephone Encounter (Addendum)
 Patient would like Dr. Antonetta to call in cream for butt pain to prevent bed sores.  Callback #: [phone]  Preferred Pharmacy: [facility] - [place], [place] - [address] [place] [address] Phone: [phone] Fax: [phone] Hours: Not open 24 hours

## 2023-10-27 ENCOUNTER — Observation Stay (HOSPITAL_COMMUNITY)
Admission: EM | Admit: 2023-10-27 | Discharge: 2023-10-28 | Disposition: A | Discharge status: Home Health | Source: Other Acute Inpatient Hospital | Attending: Family Medicine | Admitting: Family Medicine

## 2023-10-27 ENCOUNTER — Emergency Department (HOSPITAL_COMMUNITY)

## 2023-10-27 ENCOUNTER — Encounter (HOSPITAL_COMMUNITY): Payer: Self-pay

## 2023-10-27 ENCOUNTER — Other Ambulatory Visit: Payer: Self-pay

## 2023-10-27 DIAGNOSIS — R2232 Localized swelling, mass and lump, left upper limb: Secondary | ICD-10-CM | POA: Diagnosis not present

## 2023-10-27 DIAGNOSIS — Z9889 Other specified postprocedural states: Secondary | ICD-10-CM | POA: Insufficient documentation

## 2023-10-27 DIAGNOSIS — E1165 Type 2 diabetes mellitus with hyperglycemia: Secondary | ICD-10-CM

## 2023-10-27 DIAGNOSIS — I499 Cardiac arrhythmia, unspecified: Secondary | ICD-10-CM | POA: Diagnosis not present

## 2023-10-27 DIAGNOSIS — I739 Peripheral vascular disease, unspecified: Secondary | ICD-10-CM | POA: Diagnosis not present

## 2023-10-27 DIAGNOSIS — Z89512 Acquired absence of left leg below knee: Secondary | ICD-10-CM

## 2023-10-27 DIAGNOSIS — Z992 Dependence on renal dialysis: Secondary | ICD-10-CM | POA: Diagnosis not present

## 2023-10-27 DIAGNOSIS — I132 Hypertensive heart and chronic kidney disease with heart failure and with stage 5 chronic kidney disease, or end stage renal disease: Secondary | ICD-10-CM | POA: Insufficient documentation

## 2023-10-27 DIAGNOSIS — N2581 Secondary hyperparathyroidism of renal origin: Secondary | ICD-10-CM | POA: Diagnosis not present

## 2023-10-27 DIAGNOSIS — E1122 Type 2 diabetes mellitus with diabetic chronic kidney disease: Secondary | ICD-10-CM | POA: Insufficient documentation

## 2023-10-27 DIAGNOSIS — G629 Polyneuropathy, unspecified: Secondary | ICD-10-CM | POA: Insufficient documentation

## 2023-10-27 DIAGNOSIS — I5032 Chronic diastolic (congestive) heart failure: Secondary | ICD-10-CM

## 2023-10-27 DIAGNOSIS — Z89511 Acquired absence of right leg below knee: Secondary | ICD-10-CM

## 2023-10-27 DIAGNOSIS — R0602 Shortness of breath: Secondary | ICD-10-CM | POA: Diagnosis not present

## 2023-10-27 DIAGNOSIS — J9811 Atelectasis: Secondary | ICD-10-CM | POA: Diagnosis not present

## 2023-10-27 DIAGNOSIS — L03114 Cellulitis of left upper limb: Secondary | ICD-10-CM | POA: Diagnosis not present

## 2023-10-27 DIAGNOSIS — D631 Anemia in chronic kidney disease: Secondary | ICD-10-CM | POA: Insufficient documentation

## 2023-10-27 DIAGNOSIS — I480 Paroxysmal atrial fibrillation: Secondary | ICD-10-CM | POA: Diagnosis not present

## 2023-10-27 DIAGNOSIS — N186 End stage renal disease: Secondary | ICD-10-CM | POA: Diagnosis not present

## 2023-10-27 DIAGNOSIS — E1169 Type 2 diabetes mellitus with other specified complication: Secondary | ICD-10-CM | POA: Diagnosis present

## 2023-10-27 DIAGNOSIS — I4891 Unspecified atrial fibrillation: Principal | ICD-10-CM | POA: Insufficient documentation

## 2023-10-27 DIAGNOSIS — R079 Chest pain, unspecified: Secondary | ICD-10-CM | POA: Diagnosis not present

## 2023-10-27 DIAGNOSIS — E861 Hypovolemia: Secondary | ICD-10-CM | POA: Diagnosis not present

## 2023-10-27 DIAGNOSIS — I959 Hypotension, unspecified: Secondary | ICD-10-CM | POA: Diagnosis not present

## 2023-10-27 DIAGNOSIS — I120 Hypertensive chronic kidney disease with stage 5 chronic kidney disease or end stage renal disease: Secondary | ICD-10-CM | POA: Diagnosis not present

## 2023-10-27 DIAGNOSIS — I10 Essential (primary) hypertension: Secondary | ICD-10-CM | POA: Diagnosis not present

## 2023-10-27 DIAGNOSIS — I517 Cardiomegaly: Secondary | ICD-10-CM | POA: Diagnosis not present

## 2023-10-27 LAB — COMPREHENSIVE METABOLIC PANEL WITH GFR
ALT: 44 U/L (ref 0–44)
AST: 45 U/L — ABNORMAL HIGH (ref 15–41)
Albumin: 3.2 g/dL — ABNORMAL LOW (ref 3.5–5.0)
Alkaline Phosphatase: 130 U/L — ABNORMAL HIGH (ref 38–126)
Anion gap: 19 — ABNORMAL HIGH (ref 5–15)
BUN: 26 mg/dL — ABNORMAL HIGH (ref 8–23)
CO2: 24 mmol/L (ref 22–32)
Calcium: 8.9 mg/dL (ref 8.9–10.3)
Chloride: 95 mmol/L — ABNORMAL LOW (ref 98–111)
Creatinine, Ser: 3.53 mg/dL — ABNORMAL HIGH (ref 0.44–1.00)
GFR, Estimated: 14 mL/min — ABNORMAL LOW (ref >60)
Glucose, Bld: 105 mg/dL — ABNORMAL HIGH (ref 70–99)
Potassium: 4 mmol/L (ref 3.5–5.1)
Sodium: 138 mmol/L (ref 135–145)
Total Bilirubin: 1.2 mg/dL (ref 0.0–1.2)
Total Protein: 7.1 g/dL (ref 6.5–8.1)

## 2023-10-27 LAB — CBC WITH DIFFERENTIAL/PLATELET
Abs Immature Granulocytes: 0.59 K/uL — ABNORMAL HIGH (ref 0.00–0.07)
Basophils Absolute: 0 K/uL (ref 0.0–0.1)
Basophils Relative: 0 %
Eosinophils Absolute: 0 K/uL (ref 0.0–0.5)
Eosinophils Relative: 0 %
HCT: 35.6 % — ABNORMAL LOW (ref 36.0–46.0)
Hemoglobin: 11.1 g/dL — ABNORMAL LOW (ref 12.0–15.0)
Immature Granulocytes: 4 %
Lymphocytes Relative: 5 %
Lymphs Abs: 0.8 K/uL (ref 0.7–4.0)
MCH: 24.8 pg — ABNORMAL LOW (ref 26.0–34.0)
MCHC: 31.2 g/dL (ref 30.0–36.0)
MCV: 79.5 fL — ABNORMAL LOW (ref 80.0–100.0)
Monocytes Absolute: 1.3 K/uL — ABNORMAL HIGH (ref 0.1–1.0)
Monocytes Relative: 8 %
Neutro Abs: 14.1 K/uL — ABNORMAL HIGH (ref 1.7–7.7)
Neutrophils Relative %: 83 %
Platelets: 329 K/uL (ref 150–400)
RBC: 4.48 MIL/uL (ref 3.87–5.11)
RDW: 18.3 % — ABNORMAL HIGH (ref 11.5–15.5)
WBC: 16.9 K/uL — ABNORMAL HIGH (ref 4.0–10.5)
nRBC: 0.2 % (ref 0.0–0.2)

## 2023-10-27 LAB — HEPARIN LEVEL (UNFRACTIONATED): Heparin Unfractionated: <0.1 [IU]/mL — ABNORMAL LOW (ref 0.30–0.70)

## 2023-10-27 LAB — GLUCOSE, CAPILLARY
Glucose-Capillary: 91 mg/dL (ref 70–99)
Glucose-Capillary: 130 mg/dL — ABNORMAL HIGH (ref 70–99)

## 2023-10-27 LAB — TSH: TSH: 0.661 u[IU]/mL (ref 0.350–4.500)

## 2023-10-27 LAB — MAGNESIUM: Magnesium: 2 mg/dL (ref 1.7–2.4)

## 2023-10-27 LAB — MRSA NEXT GEN BY PCR, NASAL: MRSA by PCR Next Gen: NOT DETECTED

## 2023-10-27 MED ORDER — SODIUM CHLORIDE 0.9% FLUSH
3.0000 mL | Freq: Two times a day (BID) | INTRAVENOUS | Status: DC
  Administered 2023-10-27 – 2023-10-28 (×2): 3 mL via INTRAVENOUS

## 2023-10-27 MED ORDER — POLYETHYLENE GLYCOL 3350 17 G PO PACK: 17.0000 g | PACK | Freq: Every day | ORAL | Status: DC | PRN

## 2023-10-27 MED ORDER — CLOPIDOGREL BISULFATE 75 MG PO TABS
75.0000 mg | ORAL_TABLET | Freq: Every day | ORAL | Status: DC
  Administered 2023-10-27 – 2023-10-28 (×2): 75 mg via ORAL
  Filled 2023-10-27 (×2): qty 1

## 2023-10-27 MED ORDER — BISACODYL 10 MG RE SUPP: 10.0000 mg | Freq: Every day | RECTAL | Status: DC | PRN

## 2023-10-27 MED ORDER — INSULIN ASPART 100 UNIT/ML IJ SOLN
0.0000 [IU] | Freq: Three times a day (TID) | INTRAMUSCULAR | Status: DC
  Administered 2023-10-28: 3 [IU] via SUBCUTANEOUS
  Administered 2023-10-28: 2 [IU] via SUBCUTANEOUS
  Administered 2023-10-28: 5 [IU] via SUBCUTANEOUS

## 2023-10-27 MED ORDER — TRAZODONE HCL 50 MG PO TABS: 50.0000 mg | ORAL_TABLET | Freq: Every evening | ORAL | Status: DC | PRN

## 2023-10-27 MED ORDER — INSULIN ASPART 100 UNIT/ML IJ SOLN: 0.0000 [IU] | Freq: Every day | INTRAMUSCULAR | Status: DC

## 2023-10-27 MED ORDER — ACETAMINOPHEN 650 MG RE SUPP: 650.0000 mg | Freq: Four times a day (QID) | RECTAL | Status: DC | PRN

## 2023-10-27 MED ORDER — ONDANSETRON HCL 4 MG/2ML IJ SOLN
4.0000 mg | Freq: Four times a day (QID) | INTRAMUSCULAR | Status: DC | PRN
  Administered 2023-10-28: 4 mg via INTRAVENOUS
  Filled 2023-10-27: qty 2

## 2023-10-27 MED ORDER — OXYCODONE-ACETAMINOPHEN 5-325 MG PO TABS
1.0000 | ORAL_TABLET | Freq: Four times a day (QID) | ORAL | Status: DC | PRN
  Administered 2023-10-27 – 2023-10-28 (×4): 1 via ORAL
  Filled 2023-10-27 (×5): qty 1

## 2023-10-27 MED ORDER — SODIUM CHLORIDE 0.9% FLUSH
3.0000 mL | Freq: Two times a day (BID) | INTRAVENOUS | Status: DC
  Administered 2023-10-27: 3 mL via INTRAVENOUS

## 2023-10-27 MED ORDER — RENA-VITE PO TABS
1.0000 | ORAL_TABLET | Freq: Every day | ORAL | Status: DC
  Administered 2023-10-27 – 2023-10-28 (×2): 1 via ORAL
  Filled 2023-10-27 (×2): qty 1

## 2023-10-27 MED ORDER — INSULIN GLARGINE-YFGN 100 UNIT/ML ~~LOC~~ SOLN
30.0000 [IU] | Freq: Every day | SUBCUTANEOUS | Status: DC
  Filled 2023-10-27 (×2): qty 0.3

## 2023-10-27 MED ORDER — HEPARIN (PORCINE) 25000 UT/250ML-% IV SOLN
1250.0000 [IU]/h | INTRAVENOUS | Status: DC
  Administered 2023-10-27: 1100 [IU]/h via INTRAVENOUS
  Filled 2023-10-27 (×2): qty 250

## 2023-10-27 MED ORDER — METOPROLOL SUCCINATE ER 50 MG PO TB24
50.0000 mg | ORAL_TABLET | ORAL | Status: DC
  Administered 2023-10-28: 50 mg via ORAL
  Filled 2023-10-27: qty 1

## 2023-10-27 MED ORDER — SODIUM CHLORIDE 0.9 % IV SOLN: INTRAVENOUS | Status: AC | PRN

## 2023-10-27 MED ORDER — GABAPENTIN 100 MG PO CAPS: 100.0000 mg | ORAL_CAPSULE | Freq: Three times a day (TID) | ORAL | Status: DC

## 2023-10-27 MED ORDER — HEPARIN BOLUS VIA INFUSION: 4000.0000 [IU] | Freq: Once | INTRAVENOUS | Status: DC

## 2023-10-27 MED ORDER — EZETIMIBE 10 MG PO TABS
10.0000 mg | ORAL_TABLET | Freq: Every day | ORAL | Status: DC
  Administered 2023-10-27 – 2023-10-28 (×2): 10 mg via ORAL
  Filled 2023-10-27 (×2): qty 1

## 2023-10-27 MED ORDER — CHLORHEXIDINE GLUCONATE CLOTH 2 % EX PADS
6.0000 | MEDICATED_PAD | Freq: Every day | CUTANEOUS | Status: DC
  Administered 2023-10-28: 6 via TOPICAL

## 2023-10-27 MED ORDER — SUCROFERRIC OXYHYDROXIDE 500 MG PO CHEW
1000.0000 mg | CHEWABLE_TABLET | Freq: Two times a day (BID) | ORAL | Status: DC
  Administered 2023-10-27 – 2023-10-28 (×2): 1000 mg via ORAL
  Filled 2023-10-27 (×4): qty 2

## 2023-10-27 MED ORDER — ACETAMINOPHEN 325 MG PO TABS: 650.0000 mg | ORAL_TABLET | Freq: Four times a day (QID) | ORAL | Status: DC | PRN

## 2023-10-27 MED ORDER — BUSPIRONE HCL 5 MG PO TABS
5.0000 mg | ORAL_TABLET | Freq: Two times a day (BID) | ORAL | Status: DC
  Administered 2023-10-27 – 2023-10-28 (×2): 5 mg via ORAL
  Filled 2023-10-27 (×2): qty 1

## 2023-10-27 MED ORDER — DULOXETINE HCL 30 MG PO CPEP
60.0000 mg | ORAL_CAPSULE | Freq: Every day | ORAL | Status: DC
  Administered 2023-10-27 – 2023-10-28 (×2): 60 mg via ORAL
  Filled 2023-10-27 (×2): qty 2

## 2023-10-27 MED ORDER — TORSEMIDE 20 MG PO TABS
100.0000 mg | ORAL_TABLET | ORAL | Status: DC
  Administered 2023-10-28: 100 mg via ORAL
  Filled 2023-10-27: qty 5

## 2023-10-27 MED ORDER — SUCROFERRIC OXYHYDROXIDE 500 MG PO CHEW
500.0000 mg | CHEWABLE_TABLET | ORAL | Status: DC
  Administered 2023-10-27 – 2023-10-28 (×2): 500 mg via ORAL
  Filled 2023-10-27 (×6): qty 1

## 2023-10-27 MED ORDER — SODIUM CHLORIDE 0.9 % IV BOLUS
1000.0000 mL | Freq: Once | INTRAVENOUS | Status: AC
  Administered 2023-10-27: 1000 mL via INTRAVENOUS

## 2023-10-27 MED ORDER — ONDANSETRON HCL 4 MG PO TABS
4.0000 mg | ORAL_TABLET | Freq: Four times a day (QID) | ORAL | Status: DC | PRN
Start: 2023-10-27 — End: 2023-10-28

## 2023-10-27 MED ORDER — ASPIRIN 81 MG PO CHEW
162.0000 mg | CHEWABLE_TABLET | Freq: Every day | ORAL | Status: DC
  Administered 2023-10-27 – 2023-10-28 (×2): 162 mg via ORAL
  Filled 2023-10-27 (×2): qty 2

## 2023-10-27 MED ORDER — HYDRALAZINE HCL 25 MG PO TABS
25.0000 mg | ORAL_TABLET | Freq: Two times a day (BID) | ORAL | Status: DC
  Administered 2023-10-28: 25 mg via ORAL
  Filled 2023-10-27: qty 1

## 2023-10-27 MED ORDER — CINACALCET HCL 30 MG PO TABS
90.0000 mg | ORAL_TABLET | ORAL | Status: DC
  Administered 2023-10-27: 90 mg via ORAL
  Filled 2023-10-27: qty 3

## 2023-10-27 MED ORDER — HEPARIN BOLUS VIA INFUSION
4500.0000 [IU] | Freq: Once | INTRAVENOUS | Status: AC
  Administered 2023-10-27: 4500 [IU] via INTRAVENOUS

## 2023-10-27 MED ORDER — FENTANYL CITRATE (PF) 100 MCG/2ML IJ SOLN
50.0000 ug | Freq: Once | INTRAMUSCULAR | Status: AC
  Administered 2023-10-27: 50 ug via INTRAVENOUS

## 2023-10-27 MED ORDER — FENTANYL CITRATE (PF) 100 MCG/2ML IJ SOLN
50.0000 ug | INTRAMUSCULAR | Status: AC
  Administered 2023-10-27: 50 ug via INTRAVENOUS
  Filled 2023-10-27: qty 2

## 2023-10-27 MED ORDER — AMLODIPINE BESYLATE 5 MG PO TABS
10.0000 mg | ORAL_TABLET | Freq: Every day | ORAL | Status: DC
Start: 2023-10-28 — End: 2023-10-28
  Administered 2023-10-28: 10 mg via ORAL
  Filled 2023-10-27: qty 2

## 2023-10-27 MED ORDER — SODIUM CHLORIDE 0.9% FLUSH: 3.0000 mL | INTRAVENOUS | Status: DC | PRN

## 2023-10-27 MED ORDER — SERTRALINE HCL 50 MG PO TABS
50.0000 mg | ORAL_TABLET | Freq: Every day | ORAL | Status: DC
  Administered 2023-10-27 – 2023-10-28 (×2): 50 mg via ORAL
  Filled 2023-10-27 (×2): qty 1

## 2023-10-27 NOTE — H&P (Signed)
 History and Physical    Patient: Kathryn Beck FMW:[id number] DOB: [birth year] DOA: 10/27/2023 DOS: the patient was seen and examined on 10/27/2023 PCP: Antonetta Rollene BRAVO, MD  Patient coming from: Home  Chief Complaint:  Chief Complaint  Patient presents with   Atrial Fibrillation   HPI: Kathryn Beck is a 68 y.o. female with medical history significant for peripheral artery disease with bilateral BKA, prior angioplasty and stent placements,, chronic diastolic dysfunction CHF, ESRD on MWF HD schedule, depression, chronic anemia, depression/anxiety, prior history of stroke, HTN, DM2--presents from hemodialysis center with palpitations dizziness and hypotension--she was a couple of hours into her dialysis session when she became hemodynamically unstable dyspneic and hypotensive -- Additional history obtained from patient's daughter who is an Charity fundraiser, patient's daughter-in-law Ms. Shona Chalet, patient's granddaughter Norva - Due to A-fib with RVR with hemodynamic instability patient underwent--synchronized cardioversion, rhythm: atrial fibrillation RVR. shock at 120J, pt back in sinus rhythm -- --heart rate and BP recovered No fever  Or chills   No Nausea, Vomiting or Diarrhea - In ED - WBC up to 16.9, Hgb 11.1, platelets 329 - Potassium 4.0, magnesium  2.0, creatinine 3.53 BUN 26 -- TSH 0.661 - Chest X-ray without acute findings Initial EKG on admission with A-fib with  RVR in the 150s -After cardiac version patient back in sinus rhythm per twelve-lead EKG  Review of Systems: As mentioned in the history of present illness. All other systems reviewed and are negative. Past Medical History:  Diagnosis Date   Acid reflux    Amputated left leg (HCC)    bilateral BKA   Anemia    Arthritis    Axillary masses    Soft tissue - status post excision   Back pain    CHF (congestive heart failure) (HCC)    COVID-19 virus infection 04/06/2019   Depression    End-stage renal disease (HCC)     M/W/F dialysis   Essential hypertension    Headache    years ago   History of blood transfusion    History of cardiac catheterization    Normal coronary arteries October 2020   History of claustrophobia    History of pneumonia 2019   Hypoxia 04/03/2019   Memory loss    Mixed hyperlipidemia    Obesity    Pancreatitis    Peritoneal dialysis catheter in place [facility])    Pneumonia due to COVID-19 virus 04/02/2019   Sleep apnea    Noncompliant with CPAP   Stroke (HCC)    mini stroke   Type 2 diabetes mellitus (HCC)    Past Surgical History:  Procedure Laterality Date   ABDOMINAL AORTOGRAM W/LOWER EXTREMITY N/A 04/30/2022   Procedure: ABDOMINAL AORTOGRAM W/LOWER EXTREMITY;  Surgeon: Magda Debby SAILOR, MD;  Location: [facility];  Service: Cardiovascular;  Laterality: N/A;   ABDOMINAL AORTOGRAM W/LOWER EXTREMITY N/A 07/21/2022   Procedure: ABDOMINAL AORTOGRAM W/LOWER EXTREMITY;  Surgeon: Serene Gaile ORN, MD;  Location: [facility];  Service: Cardiovascular;  Laterality: N/A;   ABDOMINAL HYSTERECTOMY     ACHILLES TENDON LENGTHENING  08/15/2022   Procedure: ACHILLES TENDON LENGTHENING;  Surgeon: Silva Juliene SAUNDERS, DPM;  Location: [facility] OR;  Service: Podiatry;;   AMPUTATION Right 05/29/2022   Procedure: RIGHT BELOW THE KNEE AMPUTATION;  Surgeon: Harden Jerona GAILS, MD;  Location: [facility] OR;  Service: Orthopedics;  Laterality: Right;   AMPUTATION Left 09/04/2022   Procedure: AMPUTATION FOOT, serial irrigation;  Surgeon: Joya Stabs, DPM;  Location: [facility] OR;  Service: Podiatry;  Laterality: Left;  Surgical team to do block   AMPUTATION Left 10/07/2022   Procedure: LEFT BELOW KNEE AMPUTATION;  Surgeon: Harden Jerona GAILS, MD;  Location: [facility] OR;  Service: Orthopedics;  Laterality: Left;   AMPUTATION FINGER Left 09/29/2023   Procedure: AMPUTATION, FINGER;  Surgeon: Harden Jerona GAILS, MD;  Location: [facility] OR;  Service: Orthopedics;  Laterality: Left;  LEFT HAND LONG FINGER RAY AMPUTATION   AV FISTULA  PLACEMENT Left 09/02/2017   Procedure: creation of left arm ARTERIOVENOUS (AV) FISTULA;  Surgeon: Serene Gaile ORN, MD;  Location: [facility] OR;  Service: Vascular;  Laterality: Left;   COLONOSCOPY  2008   Dr. Harvey: normal    COLONOSCOPY N/A 12/18/2016   Dr. Harvey: multiple tubular adenomas, internal hemorrhoids. Surveillance in 3 years    ESOPHAGEAL DILATION N/A 10/13/2015   Procedure: ESOPHAGEAL DILATION;  Surgeon: Claudis RAYMOND Rivet, MD;  Location: [facility];  Service: Endoscopy;  Laterality: N/A;   ESOPHAGOGASTRODUODENOSCOPY N/A 10/13/2015   Dr. Rivet: chronic gastritis on path, no H.pylori. Empiric dilation    ESOPHAGOGASTRODUODENOSCOPY N/A 12/18/2016   Dr. Harvey: mild gastritis. BRAVO study revealed uncontrolled GERD. Dysphagia secondary to uncontrolled reflux   FOOT SURGERY Bilateral    nerve     LEFT HEART CATH AND CORONARY ANGIOGRAPHY N/A 12/29/2018   Procedure: LEFT HEART CATH AND CORONARY ANGIOGRAPHY;  Surgeon: Dann Candyce RAMAN, MD;  Location: [facility] INVASIVE CV LAB;  Service: Cardiovascular;  Laterality: N/A;   LOWER EXTREMITY ANGIOGRAPHY Right 05/04/2022   Procedure: Lower Extremity Angiography;  Surgeon: Lanis Fonda BRAVO, MD;  Location: [facility] INVASIVE CV LAB;  Service: Cardiovascular;  Laterality: Right;   LUNG BIOPSY     MASS EXCISION Right 01/09/2013   Procedure: EXCISION OF NEOPLASM OF RIGHT  AXILLA  AND EXCISION OF NEOPLASM OF LEFT AXILLA;  Surgeon: Oneil DELENA Budge, MD;  Location: [facility] ORS;  Service: General;  Laterality: Right;  procedure end @ 08:23   MYRINGOTOMY WITH TUBE PLACEMENT Bilateral 04/28/2017   Procedure: BILATERAL MYRINGOTOMY WITH TUBE PLACEMENT;  Surgeon: Karis Clunes, MD;  Location: [facility] OR;  Service: ENT;  Laterality: Bilateral;   PERIPHERAL VASCULAR BALLOON ANGIOPLASTY Right 05/04/2022   Procedure: PERIPHERAL VASCULAR BALLOON ANGIOPLASTY;  Surgeon: Lanis Fonda BRAVO, MD;  Location: [facility] INVASIVE CV LAB;  Service: Cardiovascular;  Laterality: Right;  PT   PERIPHERAL VASCULAR  INTERVENTION Right 05/04/2022   Procedure: PERIPHERAL VASCULAR INTERVENTION;  Surgeon: Lanis Fonda BRAVO, MD;  Location: [facility] INVASIVE CV LAB;  Service: Cardiovascular;  Laterality: Right;  SFA   PERIPHERAL VASCULAR INTERVENTION Left 07/21/2022   Procedure: PERIPHERAL VASCULAR INTERVENTION;  Surgeon: Serene Gaile ORN, MD;  [facility]: [facility];  Service: Cardiovascular;  Laterality: Left;   REVISION OF ARTERIOVENOUS GORETEX GRAFT Left 05/04/2018   Procedure: TRANSPOSITION OF CEPHALIC VEIN ARTERIOVENOUS FISTULA LEFT ARM;  Surgeon: Oris Krystal FALCON, MD;  Location: [facility] OR;  Service: Vascular;  Laterality: Left;   SAVORY DILATION N/A 12/18/2016   Procedure: SAVORY DILATION;  Surgeon: Harvey Margo CROME, MD;  Location: [facility];  Service: Endoscopy;  Laterality: N/A;   TRANSMETATARSAL AMPUTATION Left 08/15/2022   Procedure: TRANSMETATARSAL AMPUTATION;  Surgeon: Silva Juliene SAUNDERS, DPM;  Location: [facility] OR;  Service: Podiatry;  Laterality: Left;   Social History:  reports that she has never smoked. She has never been exposed to tobacco smoke. She has never used smokeless tobacco. She reports that she does not drink alcohol and does not use drugs.  Allergies  Allergen Reactions  Ace Inhibitors Anaphylaxis and Swelling   Penicillins Itching and Swelling    Has tolerated cefazolin  on multiple occasions    Statins Other (See Comments)    elevated LFT's     Albuterol  Swelling    Family History  Problem Relation Age of Onset   Hypertension Father    Hypercholesterolemia Father    Arthritis Father    Hypertension Sister    Hypercholesterolemia Sister    Breast cancer Sister    Hypertension Sister    Colon cancer Neg Hx    Colon polyps Neg Hx     Prior to Admission medications   Medication Sig Start Date End Date Taking? Authorizing Provider  acetaminophen  (TYLENOL ) 650 MG CR tablet Take 1,300 mg by mouth every 8 (eight) hours as needed for pain.   Yes [provider]  amLODipine   (NORVASC ) 10 MG tablet TAKE ONE TABLET BY MOUTH ONCE DAILY 10/22/23  Yes Walker, Caitlin S, NP  aspirin  81 MG chewable tablet Chew 2 tablets (162 mg total) by mouth daily. Patient taking differently: Chew 81 mg by mouth daily. 09/16/23  Yes Bevely Doffing, FNP  B Complex-C-Folic Acid  (RENA-VITE RX) 1 MG TABS Take 1 tablet by mouth daily. 04/15/22  Yes [provider]  busPIRone  (BUSPAR ) 5 MG tablet TAKE ONE TABLET BY MOUTH TWICE DAILY 10/20/23  Yes Antonetta Rollene BRAVO, MD  cinacalcet  (SENSIPAR ) 30 MG tablet Take 3 tablets (90 mg total) by mouth 3 (three) times a week. Patient taking differently: Take 90 mg by mouth every Monday, Wednesday, and Friday. 09/09/22  Yes Sheikh, Omair Latif, DO  clopidogrel  (PLAVIX ) 75 MG tablet Take 1 tablet (75 mg total) by mouth daily. 12/22/22  Yes Camara, Pastor, MD  DULoxetine  (CYMBALTA ) 60 MG capsule TAKE (1) CAPSULE BY MOUTH ONCE DAILY. 04/05/23  Yes Antonetta Rollene BRAVO, MD  ezetimibe  (ZETIA ) 10 MG tablet Take 1 tablet (10 mg total) by mouth daily. 04/27/23  Yes Antonetta Rollene BRAVO, MD  hydrALAZINE  (APRESOLINE ) 25 MG tablet Take 25 mg by mouth 2 (two) times daily. 04/27/23  Yes [provider]  insulin  glargine, 2 Unit Dial , (TOUJEO  MAX SOLOSTAR) 300 UNIT/ML Solostar Pen Inject 30 Units into the skin daily. May need to slowly increase the dose depending upon your blood sugar, follow-up with PCP 01/01/23  Yes Trixie File, MD  metoprolol  succinate (TOPROL -XL) 50 MG 24 hr tablet TAKE (1) TABLET BY MOUTH DAILY WITH FOOD *TAKE AFTER DIALYSIS* Patient taking differently: Take 50 mg by mouth Every Tuesday,Thursday,and Saturday with dialysis. Non Dialysis including sunday 01/05/23  Yes Antonetta Rollene BRAVO, MD  oxyCODONE -acetaminophen  (PERCOCET/ROXICET) 5-325 MG tablet Take 1 tablet by mouth every 6 (six) hours as needed. 09/29/23  Yes Gerome Maurilio HERO, PA-C  sertraline  (ZOLOFT ) 50 MG tablet Take 50 mg by mouth daily. 10/06/23  Yes [provider]   torsemide  (DEMADEX ) 100 MG tablet Take 100 mg by mouth 2 (two) times daily. 12/01/22  Yes [provider]  [name]  500 MG chewable tablet Chew 1-2 tablets (500-1,000 mg total) by mouth See admin instructions. Take 1000mg  (2 tablets) by mouth with meals and 500mg  (1 tablet) with snacks 08/23/23  Yes Antonetta Rollene BRAVO, MD  gabapentin  (NEURONTIN ) 100 MG capsule TAKE ONE CAPSULE BY MOUTH THREE TIMES DAILY Patient not taking: Reported on 10/27/2023 10/21/23   Camara, Amadou, MD  FLUoxetine (PROZAC) 10 MG capsule Take 10 mg by mouth daily.    05/28/11  [provider]  glipiZIDE  (GLUCOTROL ) 10 MG tablet Take 10 mg  by mouth 2 (two) times daily before a meal.    05/28/11  [provider]   Physical Exam: Vitals:   10/27/23 1341 10/27/23 1400 10/27/23 1500 10/27/23 1616  BP:  (!) 112/53 126/68   Pulse:  86 86   Resp:  14 15   Temp: 98 F (36.7 C)   97.8 F (36.6 C)  TempSrc: Oral   Oral  SpO2:  94% 100%   Weight: 75.1 kg     Height: 5' 5 (1.651 m)       Physical Exam  Gen:- Awake Alert, in no acute distress  HEENT:- [place].AT, No sclera icterus Neck-Supple Neck,No JVD,.  Lungs-  CTAB , fair air movement bilaterally  CV- S1, S2 normal, initially irregularly irregular tachycardic now regular after cardioversion Abd-  +ve B.Sounds, Abd Soft, No tenderness,    Psych-affect is appropriate, oriented x3 Neuro-no new focal deficits, no tremors MSK--bilateral BKA - Left hand postoperative findings of amputated middle finger, there is swelling and tenderness over the left hand--stitches are intact - Left upper extremity AV fistula with thrill and bruit  Data Reviewed:  --heart rate and BP recovered No fever  Or chills   No Nausea, Vomiting or Diarrhea - In ED - WBC up to 16.9, Hgb 11.1, platelets 329 - Potassium 4.0, magnesium  2.0, creatinine 3.53 BUN 26 -- TSH 0.661 - Chest X-ray without acute findings Initial EKG on admission with A-fib with  RVR in the 150s -After  cardiac version patient back in sinus rhythm per twelve-lead EKG  Assessment and Plan: 1)New onset atrial fibrillation---Due to A-fib with RVR with hemodynamic instability patient underwent--synchronized cardioversion, rhythm: atrial fibrillation RVR. shock at 120J, pt back in sinus rhythm -- -TSH 0.661 - Recent echo from June 15, 2023 with EF of 55% grade 1 diastolic dysfunction, LVH, moderate aortic stenosis and mild mitral stenosis  CHA2DS2- VASc score   is = 8    Which is  equal to = 10.8 % annual risk of stroke  -This patients CHA2DS2-VASc Score and unadjusted Ischemic Stroke Rate (% per year) is equal to 10.8 % stroke rate/year from a score of 8 --- Currently on IV heparin  drip - Patient currently on aspirin  and Plavix  for PAD--trying to reach out to Dr. Gaile Lion see physical, Plavix  since her last intervention and stents was around April 2024 -- Ideally patient should be on Eliquis  for stroke prophylaxis of A-fib and just aspirin  for PAD with stents - Consider titrating up Toprol -XL for better rate control  2)Left hand postoperative findings of amputated middle finger, there is swelling and tenderness over the left hand--stitches are intact -- significant swelling and  tenderness =-WBC 16.9--Blood cultures - Get ultrasound to rule out underlying abscess -- May need Ortho consult  3)PAD--status post bilateral BKA's - Reports prior angioplasty and stent placement last stent placed April 2024 -- Antiplatelet and anticoagulation treatment as above #1 -Currently still on aspirin  and Plavix  with IV heparin  as above - Patient apparently is intolerant to statins  4)HTN----patient had hypotension on admission in the setting of A-fib with RVR -- Continue amlodipine  10 mg daily continue Toprol -XL 50 mg daily hydralazine  25 mg twice daily  5) chronic pain/neuropathy--- continue Cymbalta   6)DM2--continue Semglee  Use Novolog /Humalog  Sliding scale insulin  with Accu-Cheks/Fingersticks  as ordered   7)ESRD--MWF HD schedule - Did not complete HD session today however she does not look particularly volume overloaded - Okay to wait till Friday for her next HD session  8) chronic anemia  of ESRD--hemoglobin currently stable at 11.1 - Defer decision on Procrit /ESA agent to nephrology team   Advance Care Planning:   Code Status: Full Code   Consults: Discussed with on-call cardiologist recommendations appreciated  Family Communication: Discussed with daughter, daughter-in-law and granddaughter at bedside  Severity of Illness: The appropriate patient status for this patient is OBSERVATION. Observation status is judged to be reasonable and necessary in order to provide the required intensity of service to ensure the patient's safety. The patient's presenting symptoms, physical exam findings, and initial radiographic and laboratory data in the context of their medical condition is felt to place them at decreased risk for further clinical deterioration. Furthermore, it is anticipated that the patient will be medically stable for discharge from the hospital within 2 midnights of admission.   Author: Rendall Carwin, MD 10/27/2023 5:50 PM  For on call review [email or web address].

## 2023-10-27 NOTE — Progress Notes (Signed)
 PHARMACY - ANTICOAGULATION CONSULT NOTE  Pharmacy Consult for Heparin  Indication: Atrial Fibrillation   Allergies  Allergen Reactions   Ace Inhibitors Anaphylaxis and Swelling   Penicillins Itching and Swelling    Has tolerated cefazolin  on multiple occasions    Statins Other (See Comments)    elevated LFT's     Albuterol  Swelling    Patient Measurements: Height: 5' 5 (165.1 cm) Weight: 75.1 kg (165 lb 9.1 oz) IBW/kg (Calculated) : 57 HEPARIN  DW (KG): 72.4  Vital Signs: Temp: 97.8 F (36.6 C) (08/06 1616) Temp Source: Oral (08/06 1616) BP: 149/40 (08/06 1700) Pulse Rate: 88 (08/06 1700)  Labs: Recent Labs    10/27/23 1108 10/27/23 1859  HGB 11.1*  --   HCT 35.6*  --   PLT 329  --   HEPARINUNFRC  --  <0.10*  CREATININE 3.53*  --     Estimated Creatinine Clearance: 15.7 mL/min (A) (by C-G formula based on SCr of 3.53 mg/dL (H)).   Medical History: Past Medical History:  Diagnosis Date   Acid reflux    Amputated left leg (HCC)    bilateral BKA   Anemia    Arthritis    Axillary masses    Soft tissue - status post excision   Back pain    CHF (congestive heart failure) (HCC)    COVID-19 virus infection 04/06/2019   Depression    End-stage renal disease (HCC)    M/W/F dialysis   Essential hypertension    Headache    years ago   History of blood transfusion    History of cardiac catheterization    Normal coronary arteries October 2020   History of claustrophobia    History of pneumonia 2019   Hypoxia 04/03/2019   Memory loss    Mixed hyperlipidemia    Obesity    Pancreatitis    Peritoneal dialysis catheter in place [facility])    Pneumonia due to COVID-19 virus 04/02/2019   Sleep apnea    Noncompliant with CPAP   Stroke (HCC)    mini stroke   Type 2 diabetes mellitus (HCC)     Assessment:  Pt is 55 YOF who presents with suspected Afib. Pt came to [facility] from dialysis and had HR in the 140's and his no prior history of Afib. Pt is currently on  aspirin  and Plavix  but no anticoagulants. Pharmacy was consulted to dose heparin  for atrial fibrillation.   Goal of Therapy:  Heparin  level 0.3-0.7 units/ml Monitor platelets by anticoagulation protocol: Yes   Plan:  Line occluded and heparin  level was un-detectable. Rn changing line. Will order heparin  level at 0400 and keep the rate the same for now.    Desaree Downen- PharmD  10/27/2023,11:00 AM

## 2023-10-27 NOTE — Plan of Care (Signed)

## 2023-10-27 NOTE — ED Provider Notes (Signed)
 [place] EMERGENCY DEPARTMENT AT [facility] Provider Note   CSN: [id number] Arrival date & time: 10/27/23  1016     Patient presents with: Atrial Fibrillation   Kathryn Beck is a 68 y.o. female.  {Add pertinent medical, surgical, social history, OB history to HPI:5361} 68 year old female with a history of ESRD on IHD, diabetes, aortic stenosis, discoid lupus, CHF, and peripheral artery disease who presents to the emergency department with palpitations and elevated heart rate.  Patient reports that on Monday she had multiple episodes of nausea vomiting and diarrhea.  Says that it resolved but when she went to dialysis today they had to stop her session after 2 hours because her heart rate became too elevated.  She reports that at 7:30 AM this morning she started feeling some palpitations.  Has not had any before that.  Was noted to be in atrial fibrillation.  Is on aspirin  and Plavix  but no other blood thinners.  Says she has never been diagnosed with atrial fibrillation before.       Prior to Admission medications   Medication Sig Start Date End Date Taking? Authorizing Provider  acetaminophen  (TYLENOL  8 HOUR ARTHRITIS PAIN) 650 MG CR tablet Take one tablet two times daily as needed, for pain 08/26/23   Antonetta Rollene BRAVO, MD  amLODipine  (NORVASC ) 10 MG tablet TAKE ONE TABLET BY MOUTH ONCE DAILY 10/22/23   Walker, Caitlin S, NP  aspirin  81 MG chewable tablet Chew 2 tablets (162 mg total) by mouth daily. 09/16/23   Bevely Doffing, FNP  B Complex-C-Folic Acid  (RENA-VITE RX) 1 MG TABS Take 1 tablet by mouth daily. 04/15/22   [provider]  busPIRone  (BUSPAR ) 5 MG tablet TAKE ONE TABLET BY MOUTH TWICE DAILY 10/20/23   Antonetta Rollene BRAVO, MD  cinacalcet  (SENSIPAR ) 30 MG tablet Take 3 tablets (90 mg total) by mouth 3 (three) times a week. Patient taking differently: Take 90 mg by mouth every Monday, Wednesday, and Friday. 09/09/22   Sherrill Cable Latif, DO  clopidogrel   (PLAVIX ) 75 MG tablet Take 1 tablet (75 mg total) by mouth daily. 12/22/22   Camara, Amadou, MD  DULoxetine  (CYMBALTA ) 60 MG capsule TAKE (1) CAPSULE BY MOUTH ONCE DAILY. 04/05/23   Antonetta Rollene BRAVO, MD  ezetimibe  (ZETIA ) 10 MG tablet Take 1 tablet (10 mg total) by mouth daily. 04/27/23   Antonetta Rollene BRAVO, MD  FIASP  FLEXTOUCH 100 UNIT/ML FlexTouch Pen INJECT 18 TO 30 UNITS INTO THE SKIN WITH BREAKFAST, WITH LUNCH, AND WITH EVENING MEAL Patient taking differently: Inject 1-10 Units into the skin 3 (three) times daily. Sliding scale 10/20/22   Trixie File, MD  fluticasone  (FLONASE ) 50 MCG/ACT nasal spray Place 2 sprays into both nostrils in the morning and at bedtime. 10/05/23   Tobie Eldora NOVAK, MD  gabapentin  (NEURONTIN ) 100 MG capsule TAKE ONE CAPSULE BY MOUTH THREE TIMES DAILY 10/21/23   Camara, Amadou, MD  glucose blood (ONETOUCH VERIO) test strip Use as instructed to check blood sugar 4X daily. 04/16/22   Trixie File, MD  hydrALAZINE  (APRESOLINE ) 25 MG tablet Take 25 mg by mouth 2 (two) times daily. 04/27/23   [provider]  insulin  glargine, 2 Unit Dial , (TOUJEO  MAX SOLOSTAR) 300 UNIT/ML Solostar Pen Inject 30 Units into the skin daily. May need to slowly increase the dose depending upon your blood sugar, follow-up with PCP 01/01/23   Trixie File, MD  meloxicam  (MOBIC ) 7.5 MG tablet Take 1 tablet (7.5 mg total) by mouth daily. 08/26/23  Antonetta Rollene BRAVO, MD  metoprolol  succinate (TOPROL -XL) 50 MG 24 hr tablet TAKE (1) TABLET BY MOUTH DAILY WITH FOOD *TAKE AFTER DIALYSIS* Patient taking differently: Take 50 mg by mouth Every Tuesday,Thursday,and Saturday with dialysis. Non Dialysis including sunday 01/05/23   Antonetta Rollene BRAVO, MD  ofloxacin  (OCUFLOX ) 0.3 % ophthalmic solution Place 1 drop into the right eye 4 (four) times daily. Patient not taking: Reported on 10/05/2023 08/11/23   Cleotilde Rogue, MD  oxyCODONE -acetaminophen  (PERCOCET/ROXICET) 5-325 MG tablet Take 1  tablet by mouth every 6 (six) hours as needed. 09/29/23   Gerome Maurilio HERO, PA-C  torsemide  (DEMADEX ) 100 MG tablet Take 100 mg by mouth See admin instructions. Take in Tues,Thursday,Sat & Sun 12/01/22   [provider]  CARMEL TO FIND Med Name: Power Chair 10/21/23   Antonetta Rollene BRAVO, MD  [name]  500 MG chewable tablet Chew 1-2 tablets (500-1,000 mg total) by mouth See admin instructions. Take 1000mg  (2 tablets) by mouth with meals and 500mg  (1 tablet) with snacks 08/23/23   Antonetta Rollene BRAVO, MD  FLUoxetine (PROZAC) 10 MG capsule Take 10 mg by mouth daily.    05/28/11  [provider]  glipiZIDE  (GLUCOTROL ) 10 MG tablet Take 10 mg by mouth 2 (two) times daily before a meal.    05/28/11  [provider]    Allergies: Ace inhibitors, Penicillins, Statins, and Albuterol     Review of Systems  Updated Vital Signs BP (!) 87/74   Resp (!) 23   Ht 5' 4 (1.626 m)   Wt 84.8 kg   BMI 32.09 kg/m   Physical Exam Vitals and nursing note reviewed.  Constitutional:      General: She is not in acute distress.    Appearance: She is well-developed.  HENT:     Head: Normocephalic and atraumatic.     Right Ear: External ear normal.     Left Ear: External ear normal.     Nose: Nose normal.  Eyes:     Extraocular Movements: Extraocular movements intact.     Conjunctiva/sclera: Conjunctivae normal.     Pupils: Pupils are equal, round, and reactive to light.  Cardiovascular:     Rate and Rhythm: Tachycardia present. Rhythm irregular.     Heart sounds: No murmur heard. Pulmonary:     Effort: Pulmonary effort is normal. No respiratory distress.     Breath sounds: Normal breath sounds.     Comments: On nasal cannula Abdominal:     General: There is no distension.     Palpations: There is no mass.     Tenderness: There is no abdominal tenderness. There is no guarding.  Musculoskeletal:     Cervical back: Normal range of motion and neck supple.     Comments: Bilateral BKA's   Skin:    General: Skin is warm and dry.  Neurological:     Mental Status: She is alert and oriented to person, place, and time. Mental status is at baseline.  Psychiatric:        Mood and Affect: Mood normal.     (all labs ordered are listed, but only abnormal results are displayed) Labs Reviewed - No data to display  EKG: None  Radiology: No results found.  {Document cardiac monitor, telemetry assessment procedure when appropriate:32947} .Ultrasound ED Peripheral IV (Provider)  Date/Time: 10/27/2023 11:28 AM  Performed by: Yolande Lamar BROCKS, MD Authorized by: Yolande Lamar BROCKS, MD   Procedure details:    Indications: multiple failed IV attempts and poor  IV access     Skin Prep: chlorhexidine  gluconate     Location:  Right AC   Angiocath:  20 G   Bedside Ultrasound Guided: Yes     Images: not archived     Patient tolerated procedure without complications: Yes     Dressing applied: Yes   .Cardioversion  Date/Time: 10/27/2023 11:42 AM  Performed by: Yolande Lamar BROCKS, MD Authorized by: Yolande Lamar BROCKS, MD   Consent:    Consent obtained:  Verbal   Consent given by:  Patient (daughter) Pre-procedure details:    Cardioversion basis:  Emergent   Rhythm:  Atrial fibrillation   Electrode placement:  Anterior-posterior Patient sedated: No Attempt one:    Cardioversion mode:  Synchronous   Waveform:  Monophasic   Shock (Joules):  120   Shock outcome:  Conversion to normal sinus rhythm Post-procedure details:    Patient status:  Awake (Drowsy from fentanyl )   Patient tolerance of procedure:  Tolerated well, no immediate complications Comments:     Synchronized cardioversion due to atrial fibrillation and low blood pressure.  Was given fentanyl  to tolerate the procedure.     Medications Ordered in the ED - No data to display  Clinical Course as of 10/27/23 1128  Wed Oct 27, 2023  1045 Attempted to contact patient's daughter without response [RP]    Clinical  Course User Index [RP] Yolande Lamar BROCKS, MD   {Click here for ABCD2, HEART and other calculators REFRESH Note before signing:1}                              Medical Decision Making Amount and/or Complexity of Data Reviewed Labs: ordered. Radiology: ordered.  Risk Prescription drug management. Decision regarding hospitalization.   ***  {Document critical care time when appropriate  Document review of labs and clinical decision tools ie CHADS2VASC2, etc  Document your independent review of radiology images and any outside records  Document your discussion with family members, caretakers and with consultants  Document social determinants of health affecting pt's care  Document your decision making why or why not admission, treatments were needed:32947:::1}   Final diagnoses:  None    ED Discharge Orders     None

## 2023-10-27 NOTE — Progress Notes (Signed)
 PHARMACY - ANTICOAGULATION CONSULT NOTE  Pharmacy Consult for Heparin  Indication: Atrial Fibrillation   Allergies  Allergen Reactions   Ace Inhibitors Anaphylaxis and Swelling   Penicillins Itching and Swelling    Has tolerated cefazolin  on multiple occasions    Statins Other (See Comments)    elevated LFT's     Albuterol  Swelling    Patient Measurements: Height: 5' 4 (162.6 cm) Weight: 84.8 kg (186 lb 15.2 oz) IBW/kg (Calculated) : 54.7 HEPARIN  DW (KG): 73.3  Vital Signs: BP: 87/74 (08/06 1030)  Labs: No results for input(s): HGB, HCT, PLT, APTT, LABPROT, INR, HEPARINUNFRC, HEPRLOWMOCWT, CREATININE, CKTOTAL, CKMB, TROPONINIHS in the last 72 hours.  CrCl cannot be calculated (Patient's most recent lab result is older than the maximum 21 days allowed.).   Medical History: Past Medical History:  Diagnosis Date   Acid reflux    Amputated left leg (HCC)    bilateral BKA   Anemia    Arthritis    Axillary masses    Soft tissue - status post excision   Back pain    CHF (congestive heart failure) (HCC)    COVID-19 virus infection 04/06/2019   Depression    End-stage renal disease (HCC)    M/W/F dialysis   Essential hypertension    Headache    years ago   History of blood transfusion    History of cardiac catheterization    Normal coronary arteries October 2020   History of claustrophobia    History of pneumonia 2019   Hypoxia 04/03/2019   Memory loss    Mixed hyperlipidemia    Obesity    Pancreatitis    Peritoneal dialysis catheter in place [facility])    Pneumonia due to COVID-19 virus 04/02/2019   Sleep apnea    Noncompliant with CPAP   Stroke (HCC)    mini stroke   Type 2 diabetes mellitus (HCC)     Assessment:  Pt is 57 YOF who presents with suspected Afib. Pt came to [facility] from dialysis and had HR in the 140's and his no prior history of Afib. Pt is currently on aspirin  and Plavix  but no anticoagulants. Pharmacy was consulted to  dose heparin  for atrial fibrillation.   Goal of Therapy:  Heparin  level 0.3-0.7 units/ml Monitor platelets by anticoagulation protocol: Yes   Plan:  Give 4500 units bolus x 1 Start heparin  infusion at 1100 units/hr Check anti-Xa level in 8 hours and daily while on heparin  Continue to monitor H&H and platelets.   Veleria Ahle- PharmD Student 10/27/2023,11:00 AM

## 2023-10-27 NOTE — ED Notes (Signed)
 MD at bedside, timeout performed, synchronized cardioversion, rhythm: atrial fibrillation RVR. shock at 120J, pt back in sinus rhythm

## 2023-10-27 NOTE — ED Notes (Signed)
 Transition of Care [facility]) - Inpatient Brief Assessment   Patient Details  Name: Kathryn Beck MRN: [id number] Date of Birth: November 23, 1955  [facility]) CM/SW Contact:    Noreen KATHEE Cleotilde ISRAEL Phone Number: 10/27/2023, 11:37 AM   Clinical Narrative:   Transition of Care Department [facility]) has reviewed patient and no TOC needs have been identified at this time. We will continue to monitor patient advancement through interdisciplinary progression rounds. If new patient transition needs arise, please place a TOC consult.  Transition of Care Asessment: Insurance and Status: Insurance coverage has been reviewed Patient has primary care physician: Yes Home environment has been reviewed: Single Family Home Prior level of function:: Independent Prior/Current Home Services: No current home services Social Drivers of Health Review: SDOH reviewed no interventions necessary Readmission risk has been reviewed: Yes Transition of care needs: no transition of care needs at this time

## 2023-10-27 NOTE — ED Triage Notes (Signed)
 Pt was bib [facility] from dialysis for Afib. Pt states she has no history of afib, states heart rate was in the 140s.

## 2023-10-28 ENCOUNTER — Telehealth (HOSPITAL_COMMUNITY): Payer: Self-pay | Admitting: Pharmacy Technician

## 2023-10-28 ENCOUNTER — Encounter: Admitting: Orthopedic Surgery

## 2023-10-28 ENCOUNTER — Encounter (HOSPITAL_COMMUNITY): Payer: Self-pay | Admitting: Hematology

## 2023-10-28 ENCOUNTER — Encounter: Payer: Self-pay | Admitting: Family

## 2023-10-28 ENCOUNTER — Observation Stay (HOSPITAL_COMMUNITY)

## 2023-10-28 ENCOUNTER — Other Ambulatory Visit (HOSPITAL_COMMUNITY): Payer: Self-pay

## 2023-10-28 DIAGNOSIS — I5032 Chronic diastolic (congestive) heart failure: Secondary | ICD-10-CM | POA: Diagnosis not present

## 2023-10-28 DIAGNOSIS — M7989 Other specified soft tissue disorders: Secondary | ICD-10-CM | POA: Diagnosis not present

## 2023-10-28 DIAGNOSIS — I4891 Unspecified atrial fibrillation: Secondary | ICD-10-CM

## 2023-10-28 DIAGNOSIS — I480 Paroxysmal atrial fibrillation: Secondary | ICD-10-CM | POA: Diagnosis not present

## 2023-10-28 DIAGNOSIS — I739 Peripheral vascular disease, unspecified: Secondary | ICD-10-CM

## 2023-10-28 DIAGNOSIS — I10 Essential (primary) hypertension: Secondary | ICD-10-CM

## 2023-10-28 DIAGNOSIS — N186 End stage renal disease: Secondary | ICD-10-CM | POA: Diagnosis not present

## 2023-10-28 DIAGNOSIS — R600 Localized edema: Secondary | ICD-10-CM | POA: Diagnosis not present

## 2023-10-28 DIAGNOSIS — L0390 Cellulitis, unspecified: Secondary | ICD-10-CM | POA: Diagnosis not present

## 2023-10-28 DIAGNOSIS — I350 Nonrheumatic aortic (valve) stenosis: Secondary | ICD-10-CM

## 2023-10-28 LAB — CBC
HCT: 32 % — ABNORMAL LOW (ref 36.0–46.0)
Hemoglobin: 9.8 g/dL — ABNORMAL LOW (ref 12.0–15.0)
MCH: 24.5 pg — ABNORMAL LOW (ref 26.0–34.0)
MCHC: 30.6 g/dL (ref 30.0–36.0)
MCV: 80 fL (ref 80.0–100.0)
Platelets: 311 K/uL (ref 150–400)
RBC: 4 MIL/uL (ref 3.87–5.11)
RDW: 18 % — ABNORMAL HIGH (ref 11.5–15.5)
WBC: 15.9 K/uL — ABNORMAL HIGH (ref 4.0–10.5)
nRBC: 0.1 % (ref 0.0–0.2)

## 2023-10-28 LAB — GLUCOSE, CAPILLARY
Glucose-Capillary: 136 mg/dL — ABNORMAL HIGH (ref 70–99)
Glucose-Capillary: 163 mg/dL — ABNORMAL HIGH (ref 70–99)
Glucose-Capillary: 207 mg/dL — ABNORMAL HIGH (ref 70–99)

## 2023-10-28 LAB — HEMOGLOBIN A1C
Hgb A1c MFr Bld: 6.7 % — ABNORMAL HIGH (ref 4.8–5.6)
Mean Plasma Glucose: 146 mg/dL

## 2023-10-28 LAB — HEPARIN LEVEL (UNFRACTIONATED): Heparin Unfractionated: <0.1 [IU]/mL — ABNORMAL LOW (ref 0.30–0.70)

## 2023-10-28 MED ORDER — APIXABAN 5 MG PO TABS
5.0000 mg | ORAL_TABLET | Freq: Two times a day (BID) | ORAL | 11 refills | Status: DC
Start: 2023-10-29 — End: 2023-12-18

## 2023-10-28 MED ORDER — HEPARIN BOLUS VIA INFUSION
2000.0000 [IU] | Freq: Once | INTRAVENOUS | Status: AC
  Administered 2023-10-28: 2000 [IU] via INTRAVENOUS
  Filled 2023-10-28: qty 2000

## 2023-10-28 MED ORDER — TORSEMIDE 100 MG PO TABS: 100.0000 mg | ORAL_TABLET | ORAL | 1 refills | Status: DC

## 2023-10-28 MED ORDER — APIXABAN 5 MG PO TABS
5.0000 mg | ORAL_TABLET | Freq: Two times a day (BID) | ORAL | Status: DC
  Administered 2023-10-28: 5 mg via ORAL
  Filled 2023-10-28: qty 1

## 2023-10-28 MED ORDER — DOXYCYCLINE HYCLATE 100 MG PO TABS
100.0000 mg | ORAL_TABLET | Freq: Two times a day (BID) | ORAL | 0 refills | Status: DC
Start: 2023-10-28 — End: 2023-11-05

## 2023-10-28 MED ORDER — CEFDINIR 300 MG PO CAPS: 300.0000 mg | ORAL_CAPSULE | Freq: Every evening | ORAL | 0 refills | Status: DC

## 2023-10-28 MED ORDER — AMLODIPINE BESYLATE 10 MG PO TABS: 5.0000 mg | ORAL_TABLET | Freq: Every day | ORAL | 2 refills | Status: DC

## 2023-10-28 MED ORDER — HEPARIN (PORCINE) 25000 UT/250ML-% IV SOLN: 1250.0000 [IU]/h | INTRAVENOUS | Status: DC

## 2023-10-28 MED ORDER — APIXABAN 5 MG PO TABS: 5.0000 mg | ORAL_TABLET | Freq: Two times a day (BID) | ORAL | Status: DC

## 2023-10-28 NOTE — Care Management Obs Status (Signed)
 MEDICARE OBSERVATION STATUS NOTIFICATION   Patient Details  Name: Kathryn Beck MRN: [id number] Date of Birth: 06-Nov-1955   Medicare Observation Status Notification Given:  Yes    [name] 10/28/2023, 4:09 PM

## 2023-10-28 NOTE — Telephone Encounter (Signed)
 Patient Product/process development scientist completed.    The patient is insured through [facility]. Patient has Medicare and is not eligible for a copay card, but may be able to apply for patient assistance or Medicare RX Payment Plan (Patient Must reach out to their plan, if eligible for payment plan), if available.    Ran test claim for Eliquis 5 mg and the current 30 day co-pay is $47.00.   This test claim was processed through [facility]- copay amounts may vary at other pharmacies due to pharmacy/plan contracts, or as the patient moves through the different stages of their insurance plan.     Roland Earl, CPHT Pharmacy Technician III Certified Patient Advocate [facility] Patient Advocate Team Direct Number: [phone]  Fax: [phone]

## 2023-10-28 NOTE — Plan of Care (Signed)
   Problem: Education: Goal: Knowledge of General Education information will improve Description: Including pain rating scale, medication(s)/side effects and non-pharmacologic comfort measures Outcome: Progressing   Problem: Coping: Goal: Level of anxiety will decrease Outcome: Progressing   Problem: Safety: Goal: Ability to remain free from injury will improve Outcome: Progressing

## 2023-10-28 NOTE — Discharge Instructions (Addendum)
 1)Very Low-salt diet advised---Less than 2 gm of Sodium per day advised----ok to use Mrs DASH salt substitute instead of Salt 2)Weigh yourself daily, call if you gain more than 3 pounds in 1 day or more than 5 pounds in 1 week as your diuretic medications and your hemodialysis schedule may need to be adjusted 3)Limit your Fluid  intake to no more than 60 ounces (1.8 Liters) per day 4) reduce amlodipine  to 5 mg daily 5) please stop Plavix /clopidogrel  6) please take Eliquis /apixaban  as prescribed for A-fib stroke prevention 7)Watch for bleeding while on Blood Thinners--watch for blood in your stool which can make your stool black, maroon, mahogany or red---, blood in your urine which can make your urine pink or red, nosebleeds , also watch for possible bruising -You are taking Apixaban /Eliquis --- which is a blood thinner--- be careful to avoid injury or falls 8) please take Omnicef  and doxycycline  antibiotics as prescribed for possible left hand infection until you see Dr. Harden next week  Information on my medicine - ELIQUIS  (apixaban )  This medication education was reviewed with me or my healthcare representative as part of my discharge preparation.    Why was Eliquis  prescribed for you? Eliquis  was prescribed for you to reduce the risk of a blood clot forming that can cause a stroke if you have a medical condition called atrial fibrillation (a type of irregular heartbeat).  What do You need to know about Eliquis  ? Take your Eliquis  TWICE DAILY - one tablet in the morning and one tablet in the evening with or without food. If you have difficulty swallowing the tablet whole please discuss with your pharmacist how to take the medication safely.  Take Eliquis  exactly as prescribed by your doctor and DO NOT stop taking Eliquis  without talking to the doctor who prescribed the medication.  Stopping may increase your risk of developing a stroke.  Refill your prescription before you run  out.  After discharge, you should have regular check-up appointments with your healthcare provider that is prescribing your Eliquis .  In the future your dose may need to be changed if your kidney function or weight changes by a significant amount or as you get older.  What do you do if you miss a dose? If you miss a dose, take it as soon as you remember on the same day and resume taking twice daily.  Do not take more than one dose of ELIQUIS  at the same time to make up a missed dose.  Important Safety Information A possible side effect of Eliquis  is bleeding. You should call your healthcare provider right away if you experience any of the following: Bleeding from an injury or your nose that does not stop. Unusual colored urine (red or dark brown) or unusual colored stools (red or black). Unusual bruising for unknown reasons. A serious fall or if you hit your head (even if there is no bleeding).  Some medicines may interact with Eliquis  and might increase your risk of bleeding or clotting while on Eliquis . To help avoid this, consult your healthcare provider or pharmacist prior to using any new prescription or non-prescription medications, including herbals, vitamins, non-steroidal anti-inflammatory drugs (NSAIDs) and supplements.  This website has more information on Eliquis  (apixaban ): [email or web address] .com/eliquis dena

## 2023-10-28 NOTE — Discharge Summary (Signed)
 Kathryn Beck, is a 68 y.o. female  DOB [birth year]  MRN [id number].  Admission date:  10/27/2023  Admitting Physician  Rendall Carwin, MD  Discharge Date:  10/28/2023   Primary MD  Antonetta Rollene BRAVO, MD  Recommendations for primary care physician for things to follow:  1)Very Low-salt diet advised---Less than 2 gm of Sodium per day advised----ok to use Mrs DASH salt substitute instead of Salt 2)Weigh yourself daily, call if you gain more than 3 pounds in 1 day or more than 5 pounds in 1 week as your diuretic medications and your hemodialysis schedule may need to be adjusted 3)Limit your Fluid  intake to no more than 60 ounces (1.8 Liters) per day 4) reduce amlodipine  to 5 mg daily 5) please stop Plavix /clopidogrel  6) please take Eliquis /apixaban  as prescribed for A-fib stroke prevention 7)Watch for bleeding while on Blood Thinners--watch for blood in your stool which can make your stool black, maroon, mahogany or red---, blood in your urine which can make your urine pink or red, nosebleeds , also watch for possible bruising -You are taking Apixaban /Eliquis --- which is a blood thinner--- be careful to avoid injury or falls 8) please take Omnicef  and doxycycline  antibiotics as prescribed for possible left hand infection until you see Dr. Harden next week  Admission Diagnosis  Afib (HCC) [I48.91]   Discharge Diagnosis  Afib (HCC) [I48.91]    Principal Problem:   Afib (HCC) Active Problems:   ESRD on hemodialysis (HCC)   HTN (hypertension)   Chronic diastolic (congestive) heart failure (HCC)   PVD (peripheral vascular disease) (HCC)   Status post below-knee amputation of left lower extremity (HCC)   Uncontrolled type 2 diabetes mellitus with hyperglycemia (HCC)      Past Medical History:  Diagnosis Date   Acid reflux    Amputated left leg (HCC)    bilateral BKA   Anemia    Arthritis    Axillary  masses    Soft tissue - status post excision   Back pain    CHF (congestive heart failure) (HCC)    COVID-19 virus infection 04/06/2019   Depression    End-stage renal disease (HCC)    M/W/F dialysis   Essential hypertension    Headache    years ago   History of blood transfusion    History of cardiac catheterization    Normal coronary arteries October 2020   History of claustrophobia    History of pneumonia 2019   Hypoxia 04/03/2019   Memory loss    Mixed hyperlipidemia    Obesity    Pancreatitis    Peritoneal dialysis catheter in place [facility])    Pneumonia due to COVID-19 virus 04/02/2019   Sleep apnea    Noncompliant with CPAP   Stroke (HCC)    mini stroke   Type 2 diabetes mellitus (HCC)     Past Surgical History:  Procedure Laterality Date   ABDOMINAL AORTOGRAM W/LOWER EXTREMITY N/A 04/30/2022   Procedure: ABDOMINAL AORTOGRAM W/LOWER EXTREMITY;  Surgeon: Magda Debby SAILOR, MD;  [facility]: [facility] IN[facility];  Service: Cardiovascular;  Laterality: N/A;   ABDOMINAL AORTOGRAM W/LOWER EXTREMITY N/A 07/21/2022   Procedure: ABDOMINAL AORTOGRAM W/LOWER EXTREMITY;  Surgeon: Serene Gaile ORN, MD;  Location: [facility];  Service: Cardiovascular;  Laterality: N/A;   ABDOMINAL HYSTERECTOMY     ACHILLES TENDON LENGTHENING  08/15/2022   Procedure: ACHILLES TENDON LENGTHENING;  Surgeon: Silva Juliene SAUNDERS, DPM;  Location: [facility] OR;  Service: Podiatry;;   AMPUTATION Right 05/29/2022   Procedure: RIGHT BELOW THE KNEE AMPUTATION;  Surgeon: Harden Jerona GAILS, MD;  Location: [facility] OR;  Service: Orthopedics;  Laterality: Right;   AMPUTATION Left 09/04/2022   Procedure: AMPUTATION FOOT, serial irrigation;  Surgeon: Joya Stabs, DPM;  Location: [facility] OR;  Service: Podiatry;  Laterality: Left;  Surgical team to do block   AMPUTATION Left 10/07/2022   Procedure: LEFT BELOW KNEE AMPUTATION;  Surgeon: Harden Jerona GAILS, MD;  Location: [facility] OR;  Service: Orthopedics;  Laterality: Left;   AMPUTATION FINGER Left  09/29/2023   Procedure: AMPUTATION, FINGER;  Surgeon: Harden Jerona GAILS, MD;  Location: [facility] OR;  Service: Orthopedics;  Laterality: Left;  LEFT HAND LONG FINGER RAY AMPUTATION   AV FISTULA PLACEMENT Left 09/02/2017   Procedure: creation of left arm ARTERIOVENOUS (AV) FISTULA;  Surgeon: Serene Gaile ORN, MD;  Location: [facility] OR;  Service: Vascular;  Laterality: Left;   COLONOSCOPY  2008   Dr. Harvey: normal    COLONOSCOPY N/A 12/18/2016   Dr. Harvey: multiple tubular adenomas, internal hemorrhoids. Surveillance in 3 years    ESOPHAGEAL DILATION N/A 10/13/2015   Procedure: ESOPHAGEAL DILATION;  Surgeon: Claudis RAYMOND Rivet, MD;  [facility];  Service: Endoscopy;  Laterality: N/A;   ESOPHAGOGASTRODUODENOSCOPY N/A 10/13/2015   Dr. Rivet: chronic gastritis on path, no H.pylori. Empiric dilation    ESOPHAGOGASTRODUODENOSCOPY N/A 12/18/2016   Dr. Harvey: mild gastritis. BRAVO study revealed uncontrolled GERD. Dysphagia secondary to uncontrolled reflux   FOOT SURGERY Bilateral    nerve     LEFT HEART CATH AND CORONARY ANGIOGRAPHY N/A 12/29/2018   Procedure: LEFT HEART CATH AND CORONARY ANGIOGRAPHY;  Surgeon: Dann Candyce RAMAN, MD;  Location: [facility] INVASIVE CV LAB;  Service: Cardiovascular;  Laterality: N/A;   LOWER EXTREMITY ANGIOGRAPHY Right 05/04/2022   Procedure: Lower Extremity Angiography;  Surgeon: Lanis Fonda BRAVO, MD;  Location: [facility] INVASIVE CV LAB;  Service: Cardiovascular;  Laterality: Right;   LUNG BIOPSY     MASS EXCISION Right 01/09/2013   Procedure: EXCISION OF NEOPLASM OF RIGHT  AXILLA  AND EXCISION OF NEOPLASM OF LEFT AXILLA;  Surgeon: Oneil DELENA Budge, MD;  Location: [facility] ORS;  Service: General;  Laterality: Right;  procedure end @ 08:23   MYRINGOTOMY WITH TUBE PLACEMENT Bilateral 04/28/2017   Procedure: BILATERAL MYRINGOTOMY WITH TUBE PLACEMENT;  Surgeon: Karis Clunes, MD;  Location: [facility] OR;  Service: ENT;  Laterality: Bilateral;   PERIPHERAL VASCULAR BALLOON ANGIOPLASTY Right 05/04/2022    Procedure: PERIPHERAL VASCULAR BALLOON ANGIOPLASTY;  Surgeon: Lanis Fonda BRAVO, MD;  Location: [facility] INVASIVE CV LAB;  Service: Cardiovascular;  Laterality: Right;  PT   PERIPHERAL VASCULAR INTERVENTION Right 05/04/2022   Procedure: PERIPHERAL VASCULAR INTERVENTION;  Surgeon: Lanis Fonda BRAVO, MD;  Location: [facility] INVASIVE CV LAB;  Service: Cardiovascular;  Laterality: Right;  SFA   PERIPHERAL VASCULAR INTERVENTION Left 07/21/2022   Procedure: PERIPHERAL VASCULAR INTERVENTION;  Surgeon: Serene Gaile ORN, MD;  [facility];  Service: Cardiovascular;  Laterality: Left;   REVISION OF ARTERIOVENOUS GORETEX GRAFT Left  05/04/2018   Procedure: TRANSPOSITION OF CEPHALIC VEIN ARTERIOVENOUS FISTULA LEFT ARM;  Surgeon: Oris Krystal FALCON, MD;  Location: [facility] OR;  Service: Vascular;  Laterality: Left;   SAVORY DILATION N/A 12/18/2016   Procedure: SAVORY DILATION;  Surgeon: Harvey Margo CROME, MD;  [facility];  Service: Endoscopy;  Laterality: N/A;   TRANSMETATARSAL AMPUTATION Left 08/15/2022   Procedure: TRANSMETATARSAL AMPUTATION;  Surgeon: Silva Juliene SAUNDERS, DPM;  Location: [facility] OR;  Service: Podiatry;  Laterality: Left;    HPI  from the history and physical done on the day of admission:   HPI: ALICEN DONALSON is a 68 y.o. female with medical history significant for peripheral artery disease with bilateral BKA, prior angioplasty and stent placements,, chronic diastolic dysfunction CHF, ESRD on MWF HD schedule, depression, chronic anemia, depression/anxiety, prior history of stroke, HTN, DM2--presents from hemodialysis center with palpitations dizziness and hypotension--she was a couple of hours into her dialysis session when she became hemodynamically unstable dyspneic and hypotensive -- Additional history obtained from patient's daughter who is an Charity fundraiser, patient's daughter-in-law Ms. Shona Chalet, patient's granddaughter Norva - Due to A-fib with RVR with hemodynamic instability patient  underwent--synchronized cardioversion, rhythm: atrial fibrillation RVR. shock at 120J, pt back in sinus rhythm -- --heart rate and BP recovered No fever  Or chills    No Nausea, Vomiting or Diarrhea - In ED - WBC up to 16.9, Hgb 11.1, platelets 329 - Potassium 4.0, magnesium  2.0, creatinine 3.53 BUN 26 -- TSH 0.661 - Chest X-ray without acute findings Initial EKG on admission with A-fib with  RVR in the 150s -After cardiac version patient back in sinus rhythm per twelve-lead EKG   Review of Systems: As mentioned in the history of present illness. All other systems reviewed and are negative.     Hospital Course:   Assessment and Plan: 1)New onset atrial fibrillation---Due to A-fib with RVR with hemodynamic instability patient underwent--synchronized cardioversion, rhythm: atrial fibrillation RVR. shock at 120J, pt back in sinus rhythm -- -TSH 0.661 - Recent echo from June 15, 2023 with EF of 55% grade 1 diastolic dysfunction, LVH, moderate aortic stenosis and mild mitral stenosis  CHA2DS2- VASc score   is = 8    Which is  equal to = 10.8 % annual risk of stroke  -This patients CHA2DS2-VASc Score and unadjusted Ischemic Stroke Rate (% per year) is equal to 10.8 % stroke rate/year from a score of 8 -anti-Coagulation antiplatelet strategy  discussed with cardiology team and with vascular surgeon Dr. Gaile New-- -PTA pt was on aspirin  and Plavix  for PAD--  Dr. Gaile Lion see physical, Plavix  since her last intervention and stents was around April 2024 -- Cardiology and vascular surgery both recommend Eliquis  for stroke prophylaxis of A-fib and just aspirin  for PAD with stents -Plavix  discontinued due to concerns for increased bleeding risk - Consider titrating up Toprol -XL for better rate control   2)Left hand postoperative findings of amputated middle finger, there is swelling and tenderness over the left hand--stitches are intact -- significant swelling and   tenderness =-WBC 16.9--Blood cultures - ultrasound of the left hand with postoperative fluid collection no definite abscess- -persistent leukocytosis noted -- -okay to discharge on Omnicef  and doxycycline  for possible cellulitis/superimposed wound infection --Patient will follow-up with Dr. Harden on Monday, 11/01/2023 for wound recheck -   3)PAD--status post bilateral BKA's - Reports prior angioplasty and stent placement last stent placed April 2024 -- Antiplatelet and anticoagulation treatment as above #1 - Patient apparently is  intolerant to statins   4)HTN----patient had hypotension on admission in the setting of A-fib with RVR -- Continue amlodipine  10 mg daily continue Toprol -XL 50 mg daily hydralazine  25 mg twice daily   5) chronic pain/neuropathy--- continue Cymbalta    6)DM2--- resume PTA regimen   7)ESRD--MWF HD schedule --Continue outpatient hemodialysis on MWF schedule   8) chronic anemia of ESRD--hemoglobin currently stable at 11.1 - Defer decision on Procrit /ESA agent to nephrology team  9) dysfunction status post bilateral  BKA--outpatient physical therapy recommended with prosthesis  Discharge Condition: stable  Follow UP--Dr. Harden on 11/01/2023   Consults obtained - Discussed with cardiology team as well as vascular surgery team  Diet and Activity recommendation:  As advised  Discharge Instructions    Discharge Instructions     Ambulatory referral to Physical Therapy   Complete by: As directed    Call MD for:  persistant dizziness or light-headedness   Complete by: As directed    Call MD for:  persistant nausea and vomiting   Complete by: As directed    Call MD for:  redness, tenderness, or signs of infection (pain, swelling, redness, odor or green/yellow discharge around incision site)   Complete by: As directed    Call MD for:  temperature >100.4   Complete by: As directed    Diet - low sodium heart healthy   Complete by: As directed    Discharge  instructions   Complete by: As directed    1)Very Low-salt diet advised---Less than 2 gm of Sodium per day advised----ok to use Mrs DASH salt substitute instead of Salt 2)Weigh yourself daily, call if you gain more than 3 pounds in 1 day or more than 5 pounds in 1 week as your diuretic medications and your hemodialysis schedule may need to be adjusted 3)Limit your Fluid  intake to no more than 60 ounces (1.8 Liters) per day 4) reduce amlodipine  to 5 mg daily 5) please stop Plavix /clopidogrel  6) please take Eliquis /apixaban  as prescribed for A-fib stroke prevention 7)Watch for bleeding while on Blood Thinners--watch for blood in your stool which can make your stool black, maroon, mahogany or red---, blood in your urine which can make your urine pink or red, nosebleeds , also watch for possible bruising -You are taking Apixaban /Eliquis --- which is a blood thinner--- be careful to avoid injury or falls 8) please take Omnicef  and doxycycline  antibiotics as prescribed for possible left hand infection until you see Dr. Harden next week   Increase activity slowly   Complete by: As directed         Discharge Medications     Allergies as of 10/28/2023       Reactions   Ace Inhibitors Anaphylaxis, Swelling   Penicillins Itching, Swelling   Has tolerated cefazolin  on multiple occasions    Statins Other (See Comments)   elevated LFT's   Albuterol  Swelling        Medication List     STOP taking these medications    clopidogrel  75 MG tablet Commonly known as: PLAVIX    gabapentin  100 MG capsule Commonly known as: NEURONTIN    hydrALAZINE  25 MG tablet Commonly known as: APRESOLINE        TAKE these medications    acetaminophen  650 MG CR tablet Commonly known as: TYLENOL  Take 1,300 mg by mouth every 8 (eight) hours as needed for pain.   amLODipine  10 MG tablet Commonly known as: NORVASC  Take 0.5 tablets (5 mg total) by mouth daily. What changed: how much to  take   apixaban  5 MG  Tabs tablet Commonly known as: ELIQUIS  Take 1 tablet (5 mg total) by mouth 2 (two) times daily. For Afib Start taking on: October 29, 2023   aspirin  81 MG chewable tablet Chew 2 tablets (162 mg total) by mouth daily. What changed: how much to take   busPIRone  5 MG tablet Commonly known as: BUSPAR  TAKE ONE TABLET BY MOUTH TWICE DAILY   cefdinir  300 MG capsule Commonly known as: OMNICEF  Take 1 capsule (300 mg total) by mouth every evening for 7 days. For possible left hand infection   cinacalcet  30 MG tablet Commonly known as: SENSIPAR  Take 3 tablets (90 mg total) by mouth 3 (three) times a week. What changed: when to take this   doxycycline  100 MG tablet Commonly known as: VIBRA -TABS Take 1 tablet (100 mg total) by mouth 2 (two) times daily for 7 days. For possible left hand infection   DULoxetine  60 MG capsule Commonly known as: CYMBALTA  TAKE (1) CAPSULE BY MOUTH ONCE DAILY.   ezetimibe  10 MG tablet Commonly known as: ZETIA  Take 1 tablet (10 mg total) by mouth daily.   metoprolol  succinate 50 MG 24 hr tablet Commonly known as: TOPROL -XL TAKE (1) TABLET BY MOUTH DAILY WITH FOOD *TAKE AFTER DIALYSIS* What changed:  how much to take how to take this when to take this additional instructions   oxyCODONE -acetaminophen  5-325 MG tablet Commonly known as: PERCOCET/ROXICET Take 1 tablet by mouth every 6 (six) hours as needed.   Rena-Vite Rx 1 MG Tabs Take 1 tablet by mouth daily.   sertraline  50 MG tablet Commonly known as: ZOLOFT  Take 50 mg by mouth daily.   torsemide  100 MG tablet Commonly known as: DEMADEX  Take 1 tablet (100 mg total) by mouth every Tuesday, Thursday, Saturday, and Sunday. What changed: when to take this   Toujeo  Max SoloStar 300 UNIT/ML Solostar Pen Generic drug: insulin  glargine (2 Unit Dial ) Inject 30 Units into the skin daily. May need to slowly increase the dose depending upon your blood sugar, follow-up with PCP   [name]  500 MG chewable  tablet Generic drug: sucroferric oxyhydroxide Chew 1-2 tablets (500-1,000 mg total) by mouth See admin instructions. Take 1000mg  (2 tablets) by mouth with meals and 500mg  (1 tablet) with snacks        Major procedures and Radiology Reports - PLEASE review detailed and final reports for all details, in brief -   US  LT UPPER EXTREM LTD SOFT TISSUE NON VASCULAR Result Date: 10/28/2023 CLINICAL DATA:  Swelling. Amputation of the left long finger on 09/29/2023. EXAM: ULTRASOUND left UPPER EXTREMITY LIMITED TECHNIQUE: Ultrasound examination of the upper extremity soft tissues was performed in the area of clinical concern. COMPARISON:  None Available. FINDINGS: Targeted sonographic evaluation of the dorsal left hand corresponding to the area of clinical concern demonstrates diffuse subcutaneous edema and ill-defined fluid without discrete loculated fluid collection. Irregular small echogenic foci with areas of dirty shadowing in the subcutaneous soft tissues could reflect soft tissue air. These findings may be secondary to postsurgical change, however, soft tissue infection with cellulitis and possible soft tissue gas cannot be excluded. IMPRESSION: Targeted sonographic evaluation of the dorsal left hand corresponding to the area of clinical concern demonstrates diffuse subcutaneous edema and ill-defined fluid without discrete loculated fluid collection. Irregular small echogenic foci with areas of dirty shadowing in the subcutaneous soft tissues could reflect soft tissue air. These findings may be secondary to postsurgical change, however, soft tissue infection with cellulitis and  possible soft tissue gas cannot be excluded. Consider further evaluation with cross-sectional imaging such as contrast-enhanced CT or MRI. Electronically Signed   By: Harrietta Sherry M.D.   On: 10/28/2023 15:04   DG Chest Portable 1 View Result Date: 10/27/2023 CLINICAL DATA:  Shortness of breath. EXAM: PORTABLE CHEST 1 VIEW  COMPARISON:  April 30, 2023. FINDINGS: Stable cardiomegaly. Right lung is clear. Minimal left basilar subsegmental atelectasis is noted. Bony thorax is unremarkable. IMPRESSION: Minimal left basilar subsegmental atelectasis. Electronically Signed   By: Lynwood Landy Raddle M.D.   On: 10/27/2023 12:08    Micro Results  Recent Results (from the past 240 hours)  MRSA Next Gen by PCR, Nasal     Status: None   Collection Time: 10/27/23  1:43 PM   Specimen: Nasal Mucosa; Nasal Swab  Result Value Ref Range Status   MRSA by PCR Next Gen NOT DETECTED NOT DETECTED Final    Comment: (NOTE) The GeneXpert MRSA Assay (FDA approved for NASAL specimens only), is one component of a comprehensive MRSA colonization surveillance program. It is not intended to diagnose MRSA infection nor to guide or monitor treatment for MRSA infections. Test performance is not FDA approved in patients less than 50 years old. Performed at [facility], [address] [place], [place] [address]   Culture, blood (Routine X 2) w Reflex to ID Panel     Status: None (Preliminary result)   Collection Time: 10/27/23  6:59 PM   Specimen: BLOOD  Result Value Ref Range Status   Specimen Description BLOOD BLOOD RIGHT HAND  Final   Special Requests   Final    AEROBIC BOTTLE ONLY Blood Culture results may not be optimal due to an inadequate volume of blood received in culture bottles   Culture   Final    NO GROWTH < 24 HOURS Performed at [facility], [address], [place], [place] [address]    Report Status PENDING  Incomplete  Culture, blood (Routine X 2) w Reflex to ID Panel     Status: None (Preliminary result)   Collection Time: 10/27/23  6:59 PM   Specimen: BLOOD  Result Value Ref Range Status   Specimen Description BLOOD BLOOD RIGHT HAND  Final   Special Requests   Final    AEROBIC BOTTLE ONLY Blood Culture results may not be optimal due to an inadequate volume of blood received in culture bottles   Culture   Final    NO  GROWTH < 24 HOURS Performed at [facility], [address] [place], [place] [address]    Report Status PENDING  Incomplete    Today   Subjective    Shanaya Schneck today has no new complaints - Patient's daughter and granddaughter at bedside, questions answered       No fever  Or chills   No Nausea, Vomiting or Diarrhea    Patient has been seen and examined prior to discharge   Objective   Blood pressure (!) 106/51, pulse 74, temperature 98.1 F (36.7 C), temperature source Oral, resp. rate 12, height 5' 5 (1.651 m), weight 75.1 kg, SpO2 95%.   Intake/Output Summary (Last 24 hours) at 10/28/2023 1700 Last data filed at 10/28/2023 0807 Gross per 24 hour  Intake 201.97 ml  Output --  Net 201.97 ml    Exam Gen:- Awake Alert, in no acute distress  HEENT:- [place].AT, No sclera icterus Neck-Supple Neck,No JVD,.  Lungs-  CTAB , fair air movement bilaterally  CV- S1, S2  normal, initially irregularly irregular tachycardic now regular after cardioversion Abd-  +ve B.Sounds, Abd Soft, No tenderness,    Psych-affect is appropriate, oriented x3 Neuro-no new focal deficits, no tremors MSK--bilateral BKA - Left hand postoperative findings of amputated middle finger, there is swelling and tenderness over the left hand--stitches are intact - Left upper extremity AV fistula with thrill and bruit   Data Review   CBC w Diff:  Lab Results  Component Value Date   WBC 15.9 (H) 10/28/2023   HGB 9.8 (L) 10/28/2023   HGB 10.9 (L) 12/15/2022   HCT 32.0 (L) 10/28/2023   HCT 37.0 12/15/2022   PLT 311 10/28/2023   PLT 354 12/15/2022   LYMPHOPCT 5 10/27/2023   MONOPCT 8 10/27/2023   EOSPCT 0 10/27/2023   BASOPCT 0 10/27/2023    CMP:  Lab Results  Component Value Date   NA 138 10/27/2023   NA 142 07/07/2022   K 4.0 10/27/2023   CL 95 (L) 10/27/2023   CO2 24 10/27/2023   BUN 26 (H) 10/27/2023   BUN 19 07/07/2022   CREATININE 3.53 (H) 10/27/2023   CREATININE 3.74 (H) 08/23/2017    GLU 211 07/17/2019   PROT 7.1 10/27/2023   PROT 6.2 12/15/2022   ALBUMIN  3.2 (L) 10/27/2023   ALBUMIN  3.8 (L) 12/15/2022   BILITOT 1.2 10/27/2023   BILITOT 0.3 12/15/2022   ALKPHOS 130 (H) 10/27/2023   AST 45 (H) 10/27/2023   ALT 44 10/27/2023  .  Total Discharge time is about 33 minutes  Rendall Carwin M.D on 10/28/2023 at 5:00 PM  Go to [email or web address] -  for contact info  [facility] - Office  [phone]

## 2023-10-28 NOTE — Consult Note (Addendum)
 Cardiology Consultation   Patient ID: Kathryn Beck MRN: [id number]; DOB: [birth year]  Admit date: 10/27/2023 Date of Consult: 10/28/2023  PCP:  Antonetta Rollene BRAVO, MD   [facility] Providers Cardiologist:  Jayson Sierras, MD        Patient Profile: Kathryn Beck is a 68 y.o. female with a hx of PAD (s/p R BKA in 05/2022, prior lithotripsy and stenting of the left superficial femoral and popliteal artery in 06/2022 and ultimately L BKA in 09/2022), HTN, HLD, Type 2 DM, bilateral adrenal adenoma (followed by Endocrinology), chronic HFpEF, aortic stenosis, OSA, ESRD, history of TIA's and history of chest pain (cath in 12/2018 showing normal coronary arteries) who is being seen 10/28/2023 for the evaluation of atrial fibrillation at the request of Dr. Pearlean.  History of Present Illness: [name] has most recently been followed by the [facility] and most recent visit was with Dr. Raford in 06/2023. Was experiencing variable blood pressure readings in the setting of dialysis with hypotension during dialysis sessions but elevated blood pressure at home on nondialysis days. She was continued on Amlodipine  10 mg daily, ASA 81 mg daily, Plavix  75 mg daily, Zetia  10 mg daily, Hydralazine  25 mg twice daily, Toprol -XL 50 mg daily and Torsemide  100 mg on nondialysis days which was deferred to Nephrology.  She presented to [facility] yesterday morning for evaluation of palpitations and tachycardia while at dialysis. In talking with the patient and her granddaughter today, she reports having intermittent nausea and vomiting starting on Monday while at dialysis. Says that her blood pressure dropped 60 points during that timeframe and this is when symptoms began. Experienced intermittent nausea and vomiting since but yesterday morning, she developed palpitations and dizziness. Was found to be tachycardic upon arrival to dialysis and transferred to the ED. She is unaware of  any prior history of atrial fibrillation or flutter. No known family history of arrhythmias. Says that she consumes only a half a cup of coffee a day and no additional caffeine intake throughout the rest of the day. No alcohol use.  She is not active at baseline given her bilateral BKA's. Says that she has been under more stress and having worsening anxiety given her multiple medical issues and questions if this contributed to her arrhythmia.  Initial labs showed WBC 16.9, Hgb 11.1, platelets 329, Na+ 138, K+ 4.0 and creatinine 3.53.  AST 45 and ALT 44 with alk phos elevated to 130. TSH 0.661. CXR showed minimal left basilar subsegmental atelectasis. EKG showed atrial fibrillation with RVR, heart rate 150.  Given that she was hypotensive in the ED with BP as low as 87/74, she underwent DCCV and converted back to normal sinus rhythm with 120 J shock. Follow-up EKG showed sinus tachycardia, heart rate 102 with baseline artifact.  She was started on IV Heparin  after the procedure and by review of the H&P, a message was sent to Vascular Surgery to see if Plavix  could be discontinued.  Past Medical History:  Diagnosis Date   Acid reflux    Amputated left leg (HCC)    bilateral BKA   Anemia    Arthritis    Axillary masses    Soft tissue - status post excision   Back pain    CHF (congestive heart failure) (HCC)    COVID-19 virus infection 04/06/2019   Depression    End-stage renal disease (HCC)    M/W/F dialysis   Essential hypertension    Headache  years ago   History of blood transfusion    History of cardiac catheterization    Normal coronary arteries October 2020   History of claustrophobia    History of pneumonia 2019   Hypoxia 04/03/2019   Memory loss    Mixed hyperlipidemia    Obesity    Pancreatitis    Peritoneal dialysis catheter in place [facility])    Pneumonia due to COVID-19 virus 04/02/2019   Sleep apnea    Noncompliant with CPAP   Stroke (HCC)    mini stroke   Type 2  diabetes mellitus (HCC)     Past Surgical History:  Procedure Laterality Date   ABDOMINAL AORTOGRAM W/LOWER EXTREMITY N/A 04/30/2022   Procedure: ABDOMINAL AORTOGRAM W/LOWER EXTREMITY;  Surgeon: Magda Debby SAILOR, MD;  Location: [facility] INV[facility];  Service: Cardiovascular;  Laterality: N/A;   ABDOMINAL AORTOGRAM W/LOWER EXTREMITY N/A 07/21/2022   Procedure: ABDOMINAL AORTOGRAM W/LOWER EXTREMITY;  Surgeon: Serene Gaile ORN, MD;  Location: [facility];  Service: Cardiovascular;  Laterality: N/A;   ABDOMINAL HYSTERECTOMY     ACHILLES TENDON LENGTHENING  08/15/2022   Procedure: ACHILLES TENDON LENGTHENING;  Surgeon: Silva Juliene SAUNDERS, DPM;  Location: [facility] OR;  Service: Podiatry;;   AMPUTATION Right 05/29/2022   Procedure: RIGHT BELOW THE KNEE AMPUTATION;  Surgeon: Harden Jerona GAILS, MD;  Location: [facility] OR;  Service: Orthopedics;  Laterality: Right;   AMPUTATION Left 09/04/2022   Procedure: AMPUTATION FOOT, serial irrigation;  Surgeon: Joya Stabs, DPM;  Location: [facility] OR;  Service: Podiatry;  Laterality: Left;  Surgical team to do block   AMPUTATION Left 10/07/2022   Procedure: LEFT BELOW KNEE AMPUTATION;  Surgeon: Harden Jerona GAILS, MD;  Location: [facility] OR;  Service: Orthopedics;  Laterality: Left;   AMPUTATION FINGER Left 09/29/2023   Procedure: AMPUTATION, FINGER;  Surgeon: Harden Jerona GAILS, MD;  Location: [facility] OR;  Service: Orthopedics;  Laterality: Left;  LEFT HAND LONG FINGER RAY AMPUTATION   AV FISTULA PLACEMENT Left 09/02/2017   Procedure: creation of left arm ARTERIOVENOUS (AV) FISTULA;  Surgeon: Serene Gaile ORN, MD;  Location: [facility] OR;  Service: Vascular;  Laterality: Left;   COLONOSCOPY  2008   Dr. Harvey: normal    COLONOSCOPY N/A 12/18/2016   Dr. Harvey: multiple tubular adenomas, internal hemorrhoids. Surveillance in 3 years    ESOPHAGEAL DILATION N/A 10/13/2015   Procedure: ESOPHAGEAL DILATION;  Surgeon: Claudis RAYMOND Rivet, MD;  [facility]: [facility];  Service: Endoscopy;  Laterality: N/A;    ESOPHAGOGASTRODUODENOSCOPY N/A 10/13/2015   Dr. Rivet: chronic gastritis on path, no H.pylori. Empiric dilation    ESOPHAGOGASTRODUODENOSCOPY N/A 12/18/2016   Dr. Harvey: mild gastritis. BRAVO study revealed uncontrolled GERD. Dysphagia secondary to uncontrolled reflux   FOOT SURGERY Bilateral    nerve     LEFT HEART CATH AND CORONARY ANGIOGRAPHY N/A 12/29/2018   Procedure: LEFT HEART CATH AND CORONARY ANGIOGRAPHY;  Surgeon: Dann Candyce RAMAN, MD;  Location: [facility] INVASIVE CV LAB;  Service: Cardiovascular;  Laterality: N/A;   LOWER EXTREMITY ANGIOGRAPHY Right 05/04/2022   Procedure: Lower Extremity Angiography;  Surgeon: Lanis Fonda BRAVO, MD;  Location: [facility] INVASIVE CV LAB;  Service: Cardiovascular;  Laterality: Right;   LUNG BIOPSY     MASS EXCISION Right 01/09/2013   Procedure: EXCISION OF NEOPLASM OF RIGHT  AXILLA  AND EXCISION OF NEOPLASM OF LEFT AXILLA;  Surgeon: Oneil DELENA Budge, MD;  Location: [facility] ORS;  Service: General;  Laterality: Right;  procedure end @ 08:23   MYRINGOTOMY WITH TUBE  PLACEMENT Bilateral 04/28/2017   Procedure: BILATERAL MYRINGOTOMY WITH TUBE PLACEMENT;  Surgeon: Karis Clunes, MD;  Location: [facility] OR;  Service: ENT;  Laterality: Bilateral;   PERIPHERAL VASCULAR BALLOON ANGIOPLASTY Right 05/04/2022   Procedure: PERIPHERAL VASCULAR BALLOON ANGIOPLASTY;  Surgeon: Lanis Fonda BRAVO, MD;  Location: [facility] INVASIVE CV LAB;  Service: Cardiovascular;  Laterality: Right;  PT   PERIPHERAL VASCULAR INTERVENTION Right 05/04/2022   Procedure: PERIPHERAL VASCULAR INTERVENTION;  Surgeon: Lanis Fonda BRAVO, MD;  Location: [facility] INVASIVE CV LAB;  Service: Cardiovascular;  Laterality: Right;  SFA   PERIPHERAL VASCULAR INTERVENTION Left 07/21/2022   Procedure: PERIPHERAL VASCULAR INTERVENTION;  Surgeon: Serene Gaile ORN, MD;  [facility];  Service: Cardiovascular;  Laterality: Left;   REVISION OF ARTERIOVENOUS GORETEX GRAFT Left 05/04/2018   Procedure: TRANSPOSITION OF CEPHALIC VEIN ARTERIOVENOUS  FISTULA LEFT ARM;  Surgeon: Oris Krystal FALCON, MD;  Location: [facility] OR;  Service: Vascular;  Laterality: Left;   SAVORY DILATION N/A 12/18/2016   Procedure: SAVORY DILATION;  Surgeon: Harvey Margo CROME, MD;  [facility];  Service: Endoscopy;  Laterality: N/A;   TRANSMETATARSAL AMPUTATION Left 08/15/2022   Procedure: TRANSMETATARSAL AMPUTATION;  Surgeon: Silva Juliene SAUNDERS, DPM;  Location: [facility] OR;  Service: Podiatry;  Laterality: Left;     Home Medications:  Prior to Admission medications   Medication Sig Start Date End Date Taking? Authorizing Provider  acetaminophen  (TYLENOL ) 650 MG CR tablet Take 1,300 mg by mouth every 8 (eight) hours as needed for pain.   Yes [provider]  amLODipine  (NORVASC ) 10 MG tablet TAKE ONE TABLET BY MOUTH ONCE DAILY 10/22/23  Yes Walker, Caitlin S, NP  aspirin  81 MG chewable tablet Chew 2 tablets (162 mg total) by mouth daily. Patient taking differently: Chew 81 mg by mouth daily. 09/16/23  Yes Bevely Doffing, FNP  B Complex-C-Folic Acid  (RENA-VITE RX) 1 MG TABS Take 1 tablet by mouth daily. 04/15/22  Yes [provider]  busPIRone  (BUSPAR ) 5 MG tablet TAKE ONE TABLET BY MOUTH TWICE DAILY 10/20/23  Yes Antonetta Rollene BRAVO, MD  cinacalcet  (SENSIPAR ) 30 MG tablet Take 3 tablets (90 mg total) by mouth 3 (three) times a week. Patient taking differently: Take 90 mg by mouth every Monday, Wednesday, and Friday. 09/09/22  Yes Sheikh, Omair Latif, DO  clopidogrel  (PLAVIX ) 75 MG tablet Take 1 tablet (75 mg total) by mouth daily. 12/22/22  Yes Camara, Pastor, MD  DULoxetine  (CYMBALTA ) 60 MG capsule TAKE (1) CAPSULE BY MOUTH ONCE DAILY. 04/05/23  Yes Antonetta Rollene BRAVO, MD  ezetimibe  (ZETIA ) 10 MG tablet Take 1 tablet (10 mg total) by mouth daily. 04/27/23  Yes Antonetta Rollene BRAVO, MD  hydrALAZINE  (APRESOLINE ) 25 MG tablet Take 25 mg by mouth 2 (two) times daily. 04/27/23  Yes [provider]  insulin  glargine, 2 Unit Dial , (TOUJEO  MAX SOLOSTAR) 300 UNIT/ML  Solostar Pen Inject 30 Units into the skin daily. May need to slowly increase the dose depending upon your blood sugar, follow-up with PCP 01/01/23  Yes Trixie File, MD  metoprolol  succinate (TOPROL -XL) 50 MG 24 hr tablet TAKE (1) TABLET BY MOUTH DAILY WITH FOOD *TAKE AFTER DIALYSIS* Patient taking differently: Take 50 mg by mouth Every Tuesday,Thursday,and Saturday with dialysis. Non Dialysis including sunday 01/05/23  Yes Antonetta Rollene BRAVO, MD  oxyCODONE -acetaminophen  (PERCOCET/ROXICET) 5-325 MG tablet Take 1 tablet by mouth every 6 (six) hours as needed. 09/29/23  Yes Gerome Maurilio HERO, PA-C  sertraline  (ZOLOFT ) 50 MG tablet Take 50 mg by mouth  daily. 10/06/23  Yes [provider]  torsemide  (DEMADEX ) 100 MG tablet Take 100 mg by mouth 2 (two) times daily. 12/01/22  Yes [provider]  [name]  500 MG chewable tablet Chew 1-2 tablets (500-1,000 mg total) by mouth See admin instructions. Take 1000mg  (2 tablets) by mouth with meals and 500mg  (1 tablet) with snacks 08/23/23  Yes Antonetta Rollene BRAVO, MD  gabapentin  (NEURONTIN ) 100 MG capsule TAKE ONE CAPSULE BY MOUTH THREE TIMES DAILY Patient not taking: Reported on 10/27/2023 10/21/23   Camara, Amadou, MD  FLUoxetine (PROZAC) 10 MG capsule Take 10 mg by mouth daily.    05/28/11  [provider]  glipiZIDE  (GLUCOTROL ) 10 MG tablet Take 10 mg by mouth 2 (two) times daily before a meal.    05/28/11  [provider]    Scheduled Meds:  amLODipine   10 mg Oral Daily   aspirin   162 mg Oral Daily   busPIRone   5 mg Oral BID   Chlorhexidine  Gluconate Cloth  6 each Topical Q0600   cinacalcet   90 mg Oral Once per day on Monday Wednesday Friday   clopidogrel   75 mg Oral Daily   DULoxetine   60 mg Oral Daily   ezetimibe   10 mg Oral Daily   hydrALAZINE   25 mg Oral BID   insulin  aspart  0-15 Units Subcutaneous TID WC   insulin  aspart  0-5 Units Subcutaneous QHS   insulin  glargine-yfgn  30 Units Subcutaneous Daily   metoprolol   succinate  50 mg Oral Q T,Th,Sa-HD   multivitamin  1 tablet Oral Daily   sertraline   50 mg Oral Daily   sodium chloride  flush  3 mL Intravenous Q12H   sodium chloride  flush  3 mL Intravenous Q12H   sucroferric oxyhydroxide  1,000 mg Oral BID WC   sucroferric oxyhydroxide  500 mg Oral With snacks   torsemide   100 mg Oral Once per day on Sunday Tuesday Thursday Saturday   Continuous Infusions:  sodium chloride      heparin  1,100 Units/hr (10/28/23 0807)   PRN Meds: sodium chloride , acetaminophen  **OR** acetaminophen , bisacodyl , ondansetron  **OR** ondansetron  (ZOFRAN ) IV, oxyCODONE -acetaminophen , polyethylene glycol, sodium chloride  flush, traZODone   Allergies:    Allergies  Allergen Reactions   Ace Inhibitors Anaphylaxis and Swelling   Penicillins Itching and Swelling    Has tolerated cefazolin  on multiple occasions    Statins Other (See Comments)    elevated LFT's     Albuterol  Swelling    Social History:   Social History   Socioeconomic History   Marital status: Married    Spouse name: Laina Guerrieri   Number of children: 2   Years of education: 12   Highest education level: Associate degree: occupational, Scientist, product/process development, or vocational program  Occupational History   Occupation: retired   Tobacco Use   Smoking status: Never    Passive exposure: Never   Smokeless tobacco: Never   Tobacco comments:    Verified by Daughter, [name]  Vaping Use   Vaping status: Never Used  Substance and Sexual Activity   Alcohol use: No   Drug use: No   Sexual activity: Not Currently    Partners: Male  Other Topics Concern   Not on file  Social History Narrative   Lives alone with husband    Right handed   Caffeine-1/2 daily    Family History:    Family History  Problem Relation Age of Onset   Hypertension Father    Hypercholesterolemia Father    Arthritis Father  Hypertension Sister    Hypercholesterolemia Sister    Breast cancer Sister    Hypertension Sister     Colon cancer Neg Hx    Colon polyps Neg Hx      ROS:  Please see the history of present illness.   All other ROS reviewed and negative.     Physical Exam/Data: Vitals:   10/28/23 0600 10/28/23 0700 10/28/23 0747 10/28/23 0800  BP: (!) 149/64 (!) 154/71  (!) 150/74  Pulse: 87 86    Resp: 17 14    Temp:      TempSrc:      SpO2: 98% 99% 98%   Weight:      Height:        Intake/Output Summary (Last 24 hours) at 10/28/2023 0841 Last data filed at 10/28/2023 9192 Gross per 24 hour  Intake 280.88 ml  Output --  Net 280.88 ml      10/27/2023    1:41 PM 10/27/2023   10:33 AM 09/29/2023    7:05 AM  Last 3 Weights  Weight (lbs) 165 lb 9.1 oz 186 lb 15.2 oz 187 lb  Weight (kg) 75.1 kg 84.8 kg 84.823 kg     Body mass index is 27.55 kg/m.  General:  Well nourished, well developed appearing in no acute distress HEENT: normal Neck: no JVD Vascular: No carotid bruits; Distal pulses 2+ bilaterally Cardiac:  normal S1, S2; RRR; 2/6 systolic murmur along RUSB.  Lungs:  clear to auscultation bilaterally, no wheezing, rhonchi or rales  Abd: soft, nontender, no hepatomegaly  Ext: no pitting edema, bilateral BKA's Skin: warm and dry  Neuro:  CNs 2-12 intact, no focal abnormalities noted Psych:  Normal affect   EKG:  The EKG was personally reviewed and demonstrates: Sinus tachycardia, heart rate 102 with baseline artifact. Telemetry:  Telemetry was personally reviewed and demonstrates: Normal sinus rhythm, heart rate mostly in 80's to 90's.  Brief episodes of narrow complex tachycardia for up to 6 seconds and appears most consistent with an SVT or atrial tachycardia.  Relevant CV Studies:  Cardiac Catheterization: 12/2018 Large, widely patent coronary arteries. LV end diastolic pressure is mildly elevated. There is no aortic valve stenosis.   No significant CAD.  Continue medical therapy.  Echocardiogram: 05/2023 IMPRESSIONS     1. Left ventricular ejection fraction, by estimation,  is 55%. The left  ventricle has normal function. The left ventricle has no regional wall  motion abnormalities. There is mild left ventricular hypertrophy. Left  ventricular diastolic parameters are  consistent with Grade I diastolic dysfunction (impaired relaxation).  Elevated left ventricular end-diastolic pressure.   2. Right ventricular systolic function is normal. The right ventricular  size is normal. Tricuspid regurgitation signal is inadequate for assessing  PA pressure.   3. Left atrial size was severely dilated.   4. The mitral valve is abnormal. Trivial mitral valve regurgitation. Mild  mitral stenosis. Severe mitral annular calcification.   5. The aortic valve has an indeterminant number of cusps. Aortic valve  regurgitation is not visualized. Moderate aortic valve stenosis. Aortic  valve area, by VTI measures 1.14 cm. Aortic valve mean gradient measures  21.0 mmHg. Aortic valve Vmax  measures 3.04 m/s.   6. The inferior vena cava is normal in size with greater than 50%  respiratory variability, suggesting right atrial pressure of 3 mmHg.   Comparison(s): Changes from prior study are noted. AS has progressed from  mild to moderate.    Laboratory Data: High Sensitivity Troponin:  No results for input(s): TROPONINIHS in the last 720 hours.   Chemistry Recent Labs  Lab 10/27/23 1108  NA 138  K 4.0  CL 95*  CO2 24  GLUCOSE 105*  BUN 26*  CREATININE 3.53*  CALCIUM  8.9  MG 2.0  GFRNONAA 14*  ANIONGAP 19*    Recent Labs  Lab 10/27/23 1108  PROT 7.1  ALBUMIN  3.2*  AST 45*  ALT 44  ALKPHOS 130*  BILITOT 1.2   Lipids No results for input(s): CHOL, TRIG, HDL, LABVLDL, LDLCALC, CHOLHDL in the last 168 hours.  Hematology Recent Labs  Lab 10/27/23 1108 10/28/23 0444  WBC 16.9* 15.9*  RBC 4.48 4.00  HGB 11.1* 9.8*  HCT 35.6* 32.0*  MCV 79.5* 80.0  MCH 24.8* 24.5*  MCHC 31.2 30.6  RDW 18.3* 18.0*  PLT 329 311   Thyroid   Recent Labs  Lab  10/27/23 1108  TSH 0.661    BNPNo results for input(s): BNP, PROBNP in the last 168 hours.  DDimer No results for input(s): DDIMER in the last 168 hours.  Radiology/Studies:  DG Chest Portable 1 View Result Date: 10/27/2023 CLINICAL DATA:  Shortness of breath. EXAM: PORTABLE CHEST 1 VIEW COMPARISON:  April 30, 2023. FINDINGS: Stable cardiomegaly. Right lung is clear. Minimal left basilar subsegmental atelectasis is noted. Bony thorax is unremarkable. IMPRESSION: Minimal left basilar subsegmental atelectasis. Electronically Signed   By: Lynwood Landy Raddle M.D.   On: 10/27/2023 12:08     Assessment and Plan:  1. New-onset Atrial Fibrillation - This is a new diagnosis for the patient this admission and she underwent DCCV while in the ED given hypotension and onset of symptoms occurring earlier that morning. She is maintaining normal sinus rhythm by review of telemetry with only brief episodes of tachycardia for less than 6 seconds which appear consistent with SVT and spontaneously resolve.  - TSH and electrolytes WNL this admission. Echocardiogram in 05/2023 showed a preserved EF of 55% with grade 1 diastolic dysfunction and moderate aortic stenosis. She did have severe dilatation of her left atria at that time. - She was on Toprol -XL 50 mg home and would continue for rate-control. - Given her CHA2DS2-VASc Score of 7, she was started on Heparin  for anticoagulation. Would recommend switching to Eliquis  5 mg twice daily. Will need to verify with Vascular Surgery if ASA or Plavix  can be discontinued as she should not be on triple therapy. Anticipate stopping Plavix  if they are in agreement given most recent stenting was over a year ago. Indications for anticoagulation were reviewed with the patient and her family.  2. Aortic Stenosis - This was moderate by echocardiogram in 05/2023. Anticipate repeat imaging next year for reassessment.  3. HTN - She has been followed by the [facility] given issues with hypotension during dialysis but elevated BP otherwise. She does hold her medications the mornings of dialysis and takes these upon returning home.  Continue Amlodipine  10 mg daily, Hydralazine  25 mg twice daily and Toprol -XL 50 mg daily for now. Torsemide  has been deferred to Nephrology due to her ESRD.  4. PAD - She previously underwent R BKA in 05/2022 and did undergo stenting and lithotripsy of the left superficial femoral and popliteal arteries in 06/2022 and Left BKA in 09/2022. - As above, will need to verify with Vascular Surgery if Plavix  can be discontinued. Remains on Zetia  10 mg daily as she has been intolerant to statins given elevated LFT's. LDL was at 61 in 11/2022.  5.  ESRD - On HD - MWD schedule.   6. Gangrene of Left Hand - Recently underwent amputation of her left long finger on 09/29/2023. US  ordered by admitting team and is pending at this time.    Risk Assessment/Risk Scores:  CHA2DS2-VASc Score = 7  This indicates a 11.2% annual risk of stroke. The patient's score is based upon: CHF History: 0 HTN History: 1 Diabetes History: 1 Stroke History: 2 Vascular Disease History: 1 Age Score: 1 Gender Score: 1   For questions or updates, please contact [facility] Please consult [email or web address] for contact info under    Signed, Laymon CHRISTELLA Qua, PA-C  10/28/2023 8:41 AM   Attending Note   Patient seen and discussed with PA Qua, I agree with her documentation. 68 yo female history of ESRD, DM2, PAD with right BKA, lupus, OSA, prior CVA, HLD with statin intolerance, moderate aortic stenosis presented with acute onset of palpitations. In ER found to be in afib with RVR. Appears given symptoms were clearly acute and <48 hour of onset and also some soft bp's she was cardioverted in the ER by ER staff to NSR. Cardiology consulted to assist with management       WBC 16.9 Hgb 11.1 Plt 329 K 4 Cr 3.53 BUN 26 Mg 2 TSH 0.661   EKG afib with RVR CXR: no acute process   05/2023 echo: LVEF 55%, grade I dd, normal RV, severe LAE, mod AS mean grad 21, AVA VTI 1.14   Afib - new diagnosis this admission - acute symptoms <48 hrs, she was electrically converted by ER staff to SR - she has been started on heparin . CHADS2Vasc score is  7 (HTN, age x 1, DM, PAD, CVA, gender). Can transition from heparin  to eliquis  5mg  bid. Has already been on torpol as outpatient - only takes toprol  on non dialysis days, she reports chronic issues with low bp's on HD days - if recurrence of afib I think would require amiodarone. No other good antiarrhythmic options with her ESRD, low bp's during HD limit av nodal agent dosing.      2. PAD - prior interventions, has been on ASA/plavix  - would clarify with vascular if eliquis  alone would be sufficient.    3. Aortic stenosis - moderate by 05/2023 echo. Mean grad 21, AVA VTI 1.14 - continue to monitor as outpatient.    No additional cardiology recs at this time, we will sign off inpatient care and arrange f/u       Dorn Ross MD

## 2023-10-28 NOTE — TOC CM/SW Note (Signed)
 Transition of Care [facility]) - Inpatient Brief Assessment   Patient Details  Name: Kathryn Beck MRN: [id number] Date of Birth: Sep 22, 1955  Transition of Care [facility]) CM/SW Contact:    Hoy DELENA Bigness, LCSW Phone Number: 10/28/2023, 11:59 AM   Clinical Narrative: Pt wheelchair bound at baseline with assistance for transfers provided by son and hoyer lift. Pt currently with O2 requirement. TOC will continue to follow.    Transition of Care Asessment: Insurance and Status: Insurance coverage has been reviewed Patient has primary care physician: Yes Home environment has been reviewed: Single family home Prior level of function:: Bilateral amputee- needs assistance Prior/Current Home Services: No current home services Social Drivers of Health Review: SDOH reviewed no interventions necessary Readmission risk has been reviewed: Yes Transition of care needs: transition of care needs identified, TOC will continue to follow

## 2023-10-28 NOTE — Progress Notes (Signed)
 PT Cancellation Note  Patient Details Name: Kathryn Beck MRN: [id number] DOB: June 02, 1955   Cancelled Treatment:    Reason Eval/Treat Not Completed: PT screened, no needs identified, will sign off. Patient at baseline for mobility, non-ambulatory, total assist for transfers. Patient wants to follow up in out patient PT clinic to learn how to ambulate with her prosthetic legs.   12:16 PM, 10/28/23 Lynwood Music, MPT Physical Therapist with [facility] [phone] office [phone] mobile phone

## 2023-10-28 NOTE — Progress Notes (Signed)
 PHARMACY - ANTICOAGULATION CONSULT NOTE  Pharmacy Consult for Heparin  Indication: Atrial Fibrillation   Allergies  Allergen Reactions   Ace Inhibitors Anaphylaxis and Swelling   Penicillins Itching and Swelling    Has tolerated cefazolin  on multiple occasions    Statins Other (See Comments)    elevated LFT's     Albuterol  Swelling    Patient Measurements: Height: 5' 5 (165.1 cm) Weight: 75.1 kg (165 lb 9.1 oz) IBW/kg (Calculated) : 57 HEPARIN  DW (KG): 72.4  Vital Signs: Temp: 98 F (36.7 C) (08/07 0321) Temp Source: Oral (08/07 0321) BP: 145/71 (08/07 0100) Pulse Rate: 91 (08/07 0321)  Labs: Recent Labs    10/27/23 1108 10/27/23 1859 10/28/23 0444  HGB 11.1*  --  9.8*  HCT 35.6*  --  32.0*  PLT 329  --  311  HEPARINUNFRC  --  <0.10* <0.10*  CREATININE 3.53*  --   --     Estimated Creatinine Clearance: 15.7 mL/min (A) (by C-G formula based on SCr of 3.53 mg/dL (H)).   Medical History: Past Medical History:  Diagnosis Date   Acid reflux    Amputated left leg (HCC)    bilateral BKA   Anemia    Arthritis    Axillary masses    Soft tissue - status post excision   Back pain    CHF (congestive heart failure) (HCC)    COVID-19 virus infection 04/06/2019   Depression    End-stage renal disease (HCC)    M/W/F dialysis   Essential hypertension    Headache    years ago   History of blood transfusion    History of cardiac catheterization    Normal coronary arteries October 2020   History of claustrophobia    History of pneumonia 2019   Hypoxia 04/03/2019   Memory loss    Mixed hyperlipidemia    Obesity    Pancreatitis    Peritoneal dialysis catheter in place [facility])    Pneumonia due to COVID-19 virus 04/02/2019   Sleep apnea    Noncompliant with CPAP   Stroke (HCC)    mini stroke   Type 2 diabetes mellitus (HCC)     Assessment:  Pt is 5 YOF who presents with suspected Afib. Pt came to [facility] from dialysis and had HR in the 140's and his no prior  history of Afib. Pt is currently on aspirin  and Plavix  but no anticoagulants. Pharmacy was consulted to dose heparin  for atrial fibrillation.   8/7 AM update: Heparin  level undetectable  Goal of Therapy:  Heparin  level 0.3-0.7 units/ml Monitor platelets by anticoagulation protocol: Yes   Plan:  Heparin  2000 units bolus Inc heparin  to 1250 units/hr Heparin  level in 8 hours  Lynwood Mckusick, PharmD, BCPS Clinical Pharmacist Phone: [phone]

## 2023-10-29 ENCOUNTER — Other Ambulatory Visit: Payer: Self-pay

## 2023-10-29 ENCOUNTER — Inpatient Hospital Stay (HOSPITAL_COMMUNITY)
Admission: EM | Admit: 2023-10-29 | Discharge: 2023-11-05 | DRG: 862 | Disposition: A | Discharge status: Home Health | Source: Other Acute Inpatient Hospital | Attending: Internal Medicine | Admitting: Internal Medicine

## 2023-10-29 ENCOUNTER — Emergency Department (HOSPITAL_COMMUNITY)

## 2023-10-29 ENCOUNTER — Encounter: Payer: Self-pay | Admitting: Family

## 2023-10-29 ENCOUNTER — Encounter (HOSPITAL_COMMUNITY): Payer: Self-pay | Admitting: Hematology

## 2023-10-29 ENCOUNTER — Encounter (HOSPITAL_COMMUNITY): Payer: Self-pay | Admitting: Emergency Medicine

## 2023-10-29 DIAGNOSIS — Z79899 Other long term (current) drug therapy: Secondary | ICD-10-CM

## 2023-10-29 DIAGNOSIS — Z7984 Long term (current) use of oral hypoglycemic drugs: Secondary | ICD-10-CM

## 2023-10-29 DIAGNOSIS — Z992 Dependence on renal dialysis: Secondary | ICD-10-CM

## 2023-10-29 DIAGNOSIS — R2232 Localized swelling, mass and lump, left upper limb: Secondary | ICD-10-CM | POA: Diagnosis not present

## 2023-10-29 DIAGNOSIS — E1165 Type 2 diabetes mellitus with hyperglycemia: Secondary | ICD-10-CM | POA: Diagnosis present

## 2023-10-29 DIAGNOSIS — F32A Depression, unspecified: Secondary | ICD-10-CM | POA: Diagnosis present

## 2023-10-29 DIAGNOSIS — M19042 Primary osteoarthritis, left hand: Secondary | ICD-10-CM | POA: Diagnosis not present

## 2023-10-29 DIAGNOSIS — Z6827 Body mass index (BMI) 27.0-27.9, adult: Secondary | ICD-10-CM

## 2023-10-29 DIAGNOSIS — E1122 Type 2 diabetes mellitus with diabetic chronic kidney disease: Secondary | ICD-10-CM | POA: Diagnosis not present

## 2023-10-29 DIAGNOSIS — I132 Hypertensive heart and chronic kidney disease with heart failure and with stage 5 chronic kidney disease, or end stage renal disease: Secondary | ICD-10-CM | POA: Diagnosis present

## 2023-10-29 DIAGNOSIS — R609 Edema, unspecified: Secondary | ICD-10-CM | POA: Diagnosis not present

## 2023-10-29 DIAGNOSIS — M129 Arthropathy, unspecified: Secondary | ICD-10-CM | POA: Diagnosis not present

## 2023-10-29 DIAGNOSIS — D631 Anemia in chronic kidney disease: Secondary | ICD-10-CM | POA: Diagnosis not present

## 2023-10-29 DIAGNOSIS — E669 Obesity, unspecified: Secondary | ICD-10-CM | POA: Diagnosis present

## 2023-10-29 DIAGNOSIS — N2581 Secondary hyperparathyroidism of renal origin: Secondary | ICD-10-CM | POA: Diagnosis not present

## 2023-10-29 DIAGNOSIS — B9561 Methicillin susceptible Staphylococcus aureus infection as the cause of diseases classified elsewhere: Secondary | ICD-10-CM | POA: Diagnosis not present

## 2023-10-29 DIAGNOSIS — G9341 Metabolic encephalopathy: Secondary | ICD-10-CM | POA: Diagnosis present

## 2023-10-29 DIAGNOSIS — E782 Mixed hyperlipidemia: Secondary | ICD-10-CM | POA: Diagnosis present

## 2023-10-29 DIAGNOSIS — Z89022 Acquired absence of left finger(s): Secondary | ICD-10-CM

## 2023-10-29 DIAGNOSIS — T8149XA Infection following a procedure, other surgical site, initial encounter: Secondary | ICD-10-CM | POA: Diagnosis present

## 2023-10-29 DIAGNOSIS — Z8616 Personal history of COVID-19: Secondary | ICD-10-CM | POA: Diagnosis not present

## 2023-10-29 DIAGNOSIS — L089 Local infection of the skin and subcutaneous tissue, unspecified: Secondary | ICD-10-CM | POA: Diagnosis not present

## 2023-10-29 DIAGNOSIS — I480 Paroxysmal atrial fibrillation: Secondary | ICD-10-CM | POA: Diagnosis present

## 2023-10-29 DIAGNOSIS — Z89512 Acquired absence of left leg below knee: Secondary | ICD-10-CM | POA: Diagnosis not present

## 2023-10-29 DIAGNOSIS — Z89511 Acquired absence of right leg below knee: Secondary | ICD-10-CM | POA: Diagnosis not present

## 2023-10-29 DIAGNOSIS — I120 Hypertensive chronic kidney disease with stage 5 chronic kidney disease or end stage renal disease: Secondary | ICD-10-CM | POA: Diagnosis not present

## 2023-10-29 DIAGNOSIS — M79643 Pain in unspecified hand: Secondary | ICD-10-CM | POA: Diagnosis not present

## 2023-10-29 DIAGNOSIS — Z8673 Personal history of transient ischemic attack (TIA), and cerebral infarction without residual deficits: Secondary | ICD-10-CM

## 2023-10-29 DIAGNOSIS — Z880 Allergy status to penicillin: Secondary | ICD-10-CM

## 2023-10-29 DIAGNOSIS — I5032 Chronic diastolic (congestive) heart failure: Secondary | ICD-10-CM | POA: Diagnosis present

## 2023-10-29 DIAGNOSIS — M79642 Pain in left hand: Secondary | ICD-10-CM | POA: Diagnosis not present

## 2023-10-29 DIAGNOSIS — Z7401 Bed confinement status: Secondary | ICD-10-CM | POA: Diagnosis not present

## 2023-10-29 DIAGNOSIS — M7989 Other specified soft tissue disorders: Secondary | ICD-10-CM | POA: Diagnosis not present

## 2023-10-29 DIAGNOSIS — A4901 Methicillin susceptible Staphylococcus aureus infection, unspecified site: Secondary | ICD-10-CM | POA: Diagnosis present

## 2023-10-29 DIAGNOSIS — S88912A Complete traumatic amputation of left lower leg, level unspecified, initial encounter: Secondary | ICD-10-CM | POA: Diagnosis not present

## 2023-10-29 DIAGNOSIS — Z8249 Family history of ischemic heart disease and other diseases of the circulatory system: Secondary | ICD-10-CM

## 2023-10-29 DIAGNOSIS — Z7901 Long term (current) use of anticoagulants: Secondary | ICD-10-CM

## 2023-10-29 DIAGNOSIS — Z743 Need for continuous supervision: Secondary | ICD-10-CM | POA: Diagnosis not present

## 2023-10-29 DIAGNOSIS — Z7982 Long term (current) use of aspirin: Secondary | ICD-10-CM

## 2023-10-29 DIAGNOSIS — I96 Gangrene, not elsewhere classified: Secondary | ICD-10-CM | POA: Diagnosis present

## 2023-10-29 DIAGNOSIS — E875 Hyperkalemia: Secondary | ICD-10-CM | POA: Diagnosis present

## 2023-10-29 DIAGNOSIS — N186 End stage renal disease: Secondary | ICD-10-CM | POA: Diagnosis not present

## 2023-10-29 DIAGNOSIS — L02512 Cutaneous abscess of left hand: Secondary | ICD-10-CM | POA: Diagnosis present

## 2023-10-29 DIAGNOSIS — D72829 Elevated white blood cell count, unspecified: Secondary | ICD-10-CM | POA: Diagnosis not present

## 2023-10-29 DIAGNOSIS — L03114 Cellulitis of left upper limb: Secondary | ICD-10-CM | POA: Diagnosis not present

## 2023-10-29 DIAGNOSIS — R6889 Other general symptoms and signs: Secondary | ICD-10-CM | POA: Diagnosis not present

## 2023-10-29 LAB — GLUCOSE, CAPILLARY
Glucose-Capillary: 66 mg/dL — ABNORMAL LOW (ref 70–99)
Glucose-Capillary: 73 mg/dL (ref 70–99)
Glucose-Capillary: 116 mg/dL — ABNORMAL HIGH (ref 70–99)

## 2023-10-29 LAB — COMPREHENSIVE METABOLIC PANEL WITH GFR
ALT: 33 U/L (ref 0–44)
AST: 22 U/L (ref 15–41)
Albumin: 2.7 g/dL — ABNORMAL LOW (ref 3.5–5.0)
Alkaline Phosphatase: 136 U/L — ABNORMAL HIGH (ref 38–126)
Anion gap: 17 — ABNORMAL HIGH (ref 5–15)
BUN: 34 mg/dL — ABNORMAL HIGH (ref 8–23)
CO2: 25 mmol/L (ref 22–32)
Calcium: 8.4 mg/dL — ABNORMAL LOW (ref 8.9–10.3)
Chloride: 98 mmol/L (ref 98–111)
Creatinine, Ser: 3.99 mg/dL — ABNORMAL HIGH (ref 0.44–1.00)
GFR, Estimated: 12 mL/min — ABNORMAL LOW (ref >60)
Glucose, Bld: 95 mg/dL (ref 70–99)
Potassium: 4.1 mmol/L (ref 3.5–5.1)
Sodium: 140 mmol/L (ref 135–145)
Total Bilirubin: 0.6 mg/dL (ref 0.0–1.2)
Total Protein: 6.8 g/dL (ref 6.5–8.1)

## 2023-10-29 LAB — CBC WITH DIFFERENTIAL/PLATELET
Abs Immature Granulocytes: 0.18 K/uL — ABNORMAL HIGH (ref 0.00–0.07)
Basophils Absolute: 0 K/uL (ref 0.0–0.1)
Basophils Relative: 0 %
Eosinophils Absolute: 0 K/uL (ref 0.0–0.5)
Eosinophils Relative: 0 %
HCT: 32.8 % — ABNORMAL LOW (ref 36.0–46.0)
Hemoglobin: 9.9 g/dL — ABNORMAL LOW (ref 12.0–15.0)
Immature Granulocytes: 1 %
Lymphocytes Relative: 6 %
Lymphs Abs: 1.1 K/uL (ref 0.7–4.0)
MCH: 23.6 pg — ABNORMAL LOW (ref 26.0–34.0)
MCHC: 30.2 g/dL (ref 30.0–36.0)
MCV: 78.3 fL — ABNORMAL LOW (ref 80.0–100.0)
Monocytes Absolute: 1.4 K/uL — ABNORMAL HIGH (ref 0.1–1.0)
Monocytes Relative: 7 %
Neutro Abs: 16.5 K/uL — ABNORMAL HIGH (ref 1.7–7.7)
Neutrophils Relative %: 86 %
Platelets: 381 K/uL (ref 150–400)
RBC: 4.19 MIL/uL (ref 3.87–5.11)
RDW: 17.9 % — ABNORMAL HIGH (ref 11.5–15.5)
WBC: 19.3 K/uL — ABNORMAL HIGH (ref 4.0–10.5)
nRBC: 0.3 % — ABNORMAL HIGH (ref 0.0–0.2)

## 2023-10-29 LAB — SEDIMENTATION RATE: Sed Rate: 65 mm/h — ABNORMAL HIGH (ref 0–30)

## 2023-10-29 LAB — C-REACTIVE PROTEIN: CRP: 45.1 mg/dL — ABNORMAL HIGH (ref <1.0)

## 2023-10-29 LAB — LACTIC ACID, PLASMA: Lactic Acid, Venous: 1.9 mmol/L (ref 0.5–1.9)

## 2023-10-29 MED ORDER — VANCOMYCIN HCL 1500 MG/300ML IV SOLN
1500.0000 mg | Freq: Once | INTRAVENOUS | Status: AC
  Administered 2023-10-29: 1500 mg via INTRAVENOUS
  Filled 2023-10-29: qty 300

## 2023-10-29 MED ORDER — CINACALCET HCL 30 MG PO TABS
90.0000 mg | ORAL_TABLET | ORAL | Status: DC
  Administered 2023-11-01 – 2023-11-03 (×4): 90 mg via ORAL
  Filled 2023-10-29 (×3): qty 3

## 2023-10-29 MED ORDER — RENA-VITE PO TABS
1.0000 | ORAL_TABLET | Freq: Every day | ORAL | Status: DC
  Administered 2023-10-29 – 2023-11-05 (×9): 1 via ORAL
  Filled 2023-10-29 (×8): qty 1

## 2023-10-29 MED ORDER — SODIUM CHLORIDE 0.9 % IV SOLN
1.0000 g | INTRAVENOUS | Status: DC
  Administered 2023-10-30 – 2023-10-31 (×2): 1 g via INTRAVENOUS
  Filled 2023-10-29 (×3): qty 10

## 2023-10-29 MED ORDER — FENTANYL CITRATE PF 50 MCG/ML IJ SOSY
25.0000 ug | PREFILLED_SYRINGE | INTRAMUSCULAR | Status: DC | PRN
  Administered 2023-10-30 – 2023-11-01 (×7): 25 ug via INTRAVENOUS
  Filled 2023-10-29 (×4): qty 1

## 2023-10-29 MED ORDER — TORSEMIDE 20 MG PO TABS
100.0000 mg | ORAL_TABLET | ORAL | Status: DC
  Administered 2023-10-30 – 2023-11-04 (×5): 100 mg via ORAL
  Filled 2023-10-29 (×4): qty 5

## 2023-10-29 MED ORDER — MORPHINE SULFATE (PF) 4 MG/ML IV SOLN
4.0000 mg | Freq: Once | INTRAVENOUS | Status: AC
  Administered 2023-10-29: 4 mg via INTRAVENOUS
  Filled 2023-10-29: qty 1

## 2023-10-29 MED ORDER — INSULIN ASPART 100 UNIT/ML IJ SOLN
0.0000 [IU] | Freq: Every day | INTRAMUSCULAR | Status: DC
  Administered 2023-10-30 – 2023-11-03 (×5): 2 [IU] via SUBCUTANEOUS

## 2023-10-29 MED ORDER — ACETAMINOPHEN 500 MG PO TABS
1000.0000 mg | ORAL_TABLET | Freq: Three times a day (TID) | ORAL | Status: DC
  Administered 2023-10-29 – 2023-11-05 (×30): 1000 mg via ORAL
  Filled 2023-10-29 (×21): qty 2

## 2023-10-29 MED ORDER — INSULIN ASPART 100 UNIT/ML IJ SOLN
0.0000 [IU] | Freq: Three times a day (TID) | INTRAMUSCULAR | Status: DC
  Administered 2023-10-30: 2 [IU] via SUBCUTANEOUS
  Administered 2023-10-30: 5 [IU] via SUBCUTANEOUS
  Administered 2023-10-31: 3 [IU] via SUBCUTANEOUS
  Administered 2023-10-31: 2 [IU] via SUBCUTANEOUS
  Administered 2023-10-31: 1 [IU] via SUBCUTANEOUS
  Administered 2023-11-02 – 2023-11-03 (×3): 2 [IU] via SUBCUTANEOUS
  Administered 2023-11-03: 1 [IU] via SUBCUTANEOUS
  Administered 2023-11-03: 2 [IU] via SUBCUTANEOUS
  Administered 2023-11-03: 1 [IU] via SUBCUTANEOUS
  Administered 2023-11-04: 2 [IU] via SUBCUTANEOUS
  Administered 2023-11-04 – 2023-11-05 (×3): 3 [IU] via SUBCUTANEOUS
  Administered 2023-11-05: 7 [IU] via SUBCUTANEOUS

## 2023-10-29 MED ORDER — ONDANSETRON HCL 4 MG/2ML IJ SOLN
4.0000 mg | Freq: Four times a day (QID) | INTRAMUSCULAR | Status: DC | PRN
  Administered 2023-11-02 (×2): 4 mg via INTRAVENOUS
  Filled 2023-10-29: qty 2

## 2023-10-29 MED ORDER — ONDANSETRON HCL 4 MG PO TABS: 4.0000 mg | ORAL_TABLET | Freq: Four times a day (QID) | ORAL | Status: DC | PRN

## 2023-10-29 MED ORDER — ASPIRIN 81 MG PO CHEW
81.0000 mg | CHEWABLE_TABLET | Freq: Every day | ORAL | Status: DC
  Administered 2023-10-29 – 2023-11-05 (×9): 81 mg via ORAL
  Filled 2023-10-29 (×7): qty 1

## 2023-10-29 MED ORDER — SERTRALINE HCL 50 MG PO TABS
50.0000 mg | ORAL_TABLET | Freq: Every day | ORAL | Status: DC
  Administered 2023-10-29 – 2023-11-05 (×7): 50 mg via ORAL
  Filled 2023-10-29 (×7): qty 1

## 2023-10-29 MED ORDER — DULOXETINE HCL 60 MG PO CPEP
60.0000 mg | ORAL_CAPSULE | Freq: Every day | ORAL | Status: DC
  Administered 2023-10-29 – 2023-11-05 (×9): 60 mg via ORAL
  Filled 2023-10-29 (×7): qty 1

## 2023-10-29 MED ORDER — METOPROLOL SUCCINATE ER 50 MG PO TB24
50.0000 mg | ORAL_TABLET | ORAL | Status: DC
  Administered 2023-10-30: 50 mg via ORAL
  Filled 2023-10-29 (×2): qty 1

## 2023-10-29 MED ORDER — SODIUM CHLORIDE 0.9 % IV BOLUS
1000.0000 mL | Freq: Once | INTRAVENOUS | Status: AC
  Administered 2023-10-29: 1000 mL via INTRAVENOUS

## 2023-10-29 MED ORDER — EZETIMIBE 10 MG PO TABS
10.0000 mg | ORAL_TABLET | Freq: Every day | ORAL | Status: DC
  Administered 2023-10-29 – 2023-11-05 (×9): 10 mg via ORAL
  Filled 2023-10-29 (×7): qty 1

## 2023-10-29 MED ORDER — SODIUM CHLORIDE 0.9 % IV SOLN
2.0000 g | Freq: Once | INTRAVENOUS | Status: AC
  Administered 2023-10-29: 2 g via INTRAVENOUS
  Filled 2023-10-29: qty 12.5

## 2023-10-29 MED ORDER — SUCROFERRIC OXYHYDROXIDE 500 MG PO CHEW
1000.0000 mg | CHEWABLE_TABLET | Freq: Three times a day (TID) | ORAL | Status: DC
  Administered 2023-10-29 – 2023-11-05 (×21): 1000 mg via ORAL
  Filled 2023-10-29 (×22): qty 2

## 2023-10-29 MED ORDER — VANCOMYCIN HCL 750 MG/150ML IV SOLN
750.0000 mg | INTRAVENOUS | Status: DC
  Administered 2023-11-01 (×2): 750 mg via INTRAVENOUS

## 2023-10-29 MED ORDER — BUSPIRONE HCL 5 MG PO TABS
5.0000 mg | ORAL_TABLET | Freq: Two times a day (BID) | ORAL | Status: DC
  Administered 2023-10-29 – 2023-11-05 (×16): 5 mg via ORAL
  Filled 2023-10-29 (×13): qty 1

## 2023-10-29 NOTE — Plan of Care (Signed)
  Problem: Safety: Goal: Ability to remain free from injury will improve Outcome: Progressing   Problem: Pain Managment: Goal: General experience of comfort will improve and/or be controlled Outcome: Progressing   Problem: Skin Integrity: Goal: Risk for impaired skin integrity will decrease Outcome: Progressing

## 2023-10-29 NOTE — ED Notes (Signed)
 Lab called to draw blood since patient is a hard stick.

## 2023-10-29 NOTE — Progress Notes (Signed)
 Pharmacy Antibiotic Note  Kathryn Beck is a 68 y.o. female admitted on 10/29/2023 with wound infection.  Pharmacy has been consulted for vancomycin  dosing.  Plan: Vancomycin  1500mg  IV loading dose, then 750 mg IV every MWF after HD Cefepime  2gm IV x 1, then 1gm IV q24h F/U cx and clinical progress Monitor V/S, labs and levels as indicated  Height: 5' 5 (165.1 cm) Weight: 76 kg (167 lb 8.8 oz) IBW/kg (Calculated) : 57  Temp (24hrs), Avg:98.3 F (36.8 C), Min:98.1 F (36.7 C), Max:98.5 F (36.9 C)  Recent Labs  Lab 10/27/23 1108 10/28/23 0444 10/29/23 1029  WBC 16.9* 15.9* 19.3*  CREATININE 3.53*  --  3.99*  LATICACIDVEN  --   --  1.9    Estimated Creatinine Clearance: 14 mL/min (A) (by C-G formula based on SCr of 3.99 mg/dL (H)).    Allergies  Allergen Reactions   Ace Inhibitors Anaphylaxis and Swelling   Penicillins Itching and Swelling    Has tolerated cefazolin  on multiple occasions    Statins Other (See Comments)    elevated LFT's     Albuterol  Swelling    Antimicrobials this admission: cefepime  8/8 >>  vancomycin  8/8 >>   Microbiology results: 8/8 BCx: pending   MRSA PCR:   Thank you for allowing pharmacy to be a part of this patient's care.  Demetri Kerman, BS Pharm D, BCPS Clinical Pharmacist 10/29/2023 1:38 PM

## 2023-10-29 NOTE — ED Notes (Signed)
 [facility] called to transport patient. Nurse made aware.

## 2023-10-29 NOTE — Plan of Care (Signed)

## 2023-10-29 NOTE — ED Notes (Signed)
 Pt c/o of pain to left hand. Nurse notes that blister size has increased since pt arrived. PA made aware.

## 2023-10-29 NOTE — ED Triage Notes (Signed)
 Pt bib [facility]ms from dialysis. Pt had a left middle finger removed x3weeks ago. Pt went to dialysis today w/ left hand swollen and one small blister on top of the hand. Per EMS the hand has more blisters that have increased in size since their arrival to get the patient. Pt is BKA bilaterally.

## 2023-10-29 NOTE — ED Notes (Addendum)
 Pt screaming out of room crying complaining of pain in her hand, says its a 10/10 pain, I can't take this anymore, god help me. Help me jesus.

## 2023-10-29 NOTE — Consult Note (Signed)
 HAND SURGERY CONSULTATION  REQUESTING PHYSICIAN: Evonnie Lenis, MD   Chief Complaint: Left hand pain and swelling  HPI: Kathryn Beck is a 68 y.o. female who presents with left hand pain and swelling over the past 24 hours or so, after recent left long finger ray resection finger 1 month prior.  Surgery performed by Dr. 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  She was healing well initially, started to notice some blistering and swelling of the left hand yesterday which has persisted.  Currently, she is comfortable at rest, does have some ongoing pain along the dorsal aspect of the hand.  She has a history of A-fib on apixaban , peripheral artery disease, is undergone prior bilateral BKA, type 2 diabetes as well as end-stage renal disease on dialysis.  Past Medical History:  Diagnosis Date   Acid reflux    Amputated left leg (HCC)    bilateral BKA   Anemia    Arthritis    Axillary masses    Soft tissue - status post excision   Back pain    CHF (congestive heart failure) (HCC)    COVID-19 virus infection 04/06/2019   Depression    End-stage renal disease (HCC)    M/W/F dialysis   Essential hypertension    Headache    years ago   History of blood transfusion    History of cardiac catheterization    Normal coronary arteries October 2020   History of claustrophobia    History of pneumonia 2019   Hypoxia 04/03/2019   Memory loss    Mixed hyperlipidemia    Obesity    Pancreatitis    Peritoneal dialysis catheter in place [facility])    Pneumonia due to COVID-19 virus 04/02/2019   Sleep apnea    Noncompliant with CPAP   Stroke (HCC)    mini stroke   Type 2 diabetes mellitus (HCC)    Past Surgical History:  Procedure Laterality Date   ABDOMINAL AORTOGRAM W/LOWER EXTREMITY N/A 04/30/2022   Procedure: ABDOMINAL AORTOGRAM W/LOWER EXTREMITY;  Surgeon: Magda Debby SAILOR, MD;  Location: [facility] INVASIVE CV LAB;  Service: Cardiovascular;  Laterality: N/A;   ABDOMINAL  AORTOGRAM W/LOWER EXTREMITY N/A 07/21/2022   Procedure: ABDOMINAL AORTOGRAM W/LOWER EXTREMITY;  Surgeon: Serene Gaile ORN, MD;  Location: [facility] INVASIVE CV LAB;  Service: Cardiovascular;  Laterality: N/A;   ABDOMINAL HYSTERECTOMY     ACHILLES TENDON LENGTHENING  08/15/2022   Procedure: ACHILLES TENDON LENGTHENING;  Surgeon: Silva Juliene SAUNDERS, DPM;  Location: [facility] OR;  Service: Podiatry;;   AMPUTATION Right 05/29/2022   Procedure: RIGHT BELOW THE KNEE AMPUTATION;  Surgeon: Harden Jerona GAILS, MD;  Location: [facility] OR;  Service: Orthopedics;  Laterality: Right;   AMPUTATION Left 09/04/2022   Procedure: AMPUTATION FOOT, serial irrigation;  Surgeon: Joya Stabs, DPM;  Location: [facility] OR;  Service: Podiatry;  Laterality: Left;  Surgical team to do block   AMPUTATION Left 10/07/2022   Procedure: LEFT BELOW KNEE AMPUTATION;  Surgeon: Harden Jerona GAILS, MD;  Location: [facility] OR;  Service: Orthopedics;  Laterality: Left;   AMPUTATION FINGER Left 09/29/2023   Procedure: AMPUTATION, FINGER;  Surgeon: Harden Jerona GAILS, MD;  Location: [facility] OR;  Service: Orthopedics;  Laterality: Left;  LEFT HAND LONG FINGER RAY AMPUTATION   AV FISTULA PLACEMENT Left 09/02/2017   Procedure: creation of left arm ARTERIOVENOUS (AV) FISTULA;  Surgeon: Serene Gaile ORN, MD;  Location: [facility] OR;  Service: Vascular;  Laterality: Left;   COLONOSCOPY  2008  Dr. Harvey: normal    COLONOSCOPY N/A 12/18/2016   Dr. Harvey: multiple tubular adenomas, internal hemorrhoids. Surveillance in 3 years    ESOPHAGEAL DILATION N/A 10/13/2015   Procedure: ESOPHAGEAL DILATION;  Surgeon: Claudis RAYMOND Rivet, MD;  Location: [facility];  Service: Endoscopy;  Laterality: N/A;   ESOPHAGOGASTRODUODENOSCOPY N/A 10/13/2015   Dr. Rivet: chronic gastritis on path, no H.pylori. Empiric dilation    ESOPHAGOGASTRODUODENOSCOPY N/A 12/18/2016   Dr. Harvey: mild gastritis. BRAVO study revealed uncontrolled GERD. Dysphagia secondary to uncontrolled reflux   FOOT SURGERY Bilateral    nerve      LEFT HEART CATH AND CORONARY ANGIOGRAPHY N/A 12/29/2018   Procedure: LEFT HEART CATH AND CORONARY ANGIOGRAPHY;  Surgeon: Dann Candyce RAMAN, MD;  Location: [facility] INVASIVE CV LAB;  Service: Cardiovascular;  Laterality: N/A;   LOWER EXTREMITY ANGIOGRAPHY Right 05/04/2022   Procedure: Lower Extremity Angiography;  Surgeon: Lanis Fonda BRAVO, MD;  Location: [facility] INVASIVE CV LAB;  Service: Cardiovascular;  Laterality: Right;   LUNG BIOPSY     MASS EXCISION Right 01/09/2013   Procedure: EXCISION OF NEOPLASM OF RIGHT  AXILLA  AND EXCISION OF NEOPLASM OF LEFT AXILLA;  Surgeon: Oneil DELENA Budge, MD;  Location: [facility] ORS;  Service: General;  Laterality: Right;  procedure end @ 08:23   MYRINGOTOMY WITH TUBE PLACEMENT Bilateral 04/28/2017   Procedure: BILATERAL MYRINGOTOMY WITH TUBE PLACEMENT;  Surgeon: Karis Clunes, MD;  Location: [facility] OR;  Service: ENT;  Laterality: Bilateral;   PERIPHERAL VASCULAR BALLOON ANGIOPLASTY Right 05/04/2022   Procedure: PERIPHERAL VASCULAR BALLOON ANGIOPLASTY;  Surgeon: Lanis Fonda BRAVO, MD;  Location: [facility] INVASIVE CV LAB;  Service: Cardiovascular;  Laterality: Right;  PT   PERIPHERAL VASCULAR INTERVENTION Right 05/04/2022   Procedure: PERIPHERAL VASCULAR INTERVENTION;  Surgeon: Lanis Fonda BRAVO, MD;  Location: [facility] INVASIVE CV LAB;  Service: Cardiovascular;  Laterality: Right;  SFA   PERIPHERAL VASCULAR INTERVENTION Left 07/21/2022   Procedure: PERIPHERAL VASCULAR INTERVENTION;  Surgeon: Serene Gaile ORN, MD;  [facility]: [facility];  Service: Cardiovascular;  Laterality: Left;   REVISION OF ARTERIOVENOUS GORETEX GRAFT Left 05/04/2018   Procedure: TRANSPOSITION OF CEPHALIC VEIN ARTERIOVENOUS FISTULA LEFT ARM;  Surgeon: Oris Krystal FALCON, MD;  Location: [facility] OR;  Service: Vascular;  Laterality: Left;   SAVORY DILATION N/A 12/18/2016   Procedure: SAVORY DILATION;  Surgeon: Harvey Margo CROME, MD;  Location[facility];  Service: Endoscopy;  Laterality: N/A;   TRANSMETATARSAL AMPUTATION Left 08/15/2022    Procedure: TRANSMETATARSAL AMPUTATION;  Surgeon: Silva Juliene SAUNDERS, DPM;  Location: [facility] OR;  Service: Podiatry;  Laterality: Left;   Social History   Socioeconomic History   Marital status: Married    Spouse name: Kathryn Beck   Number of children: 2   Years of education: 12   Highest education level: Associate degree: occupational, Scientist, product/process development, or vocational program  Occupational History   Occupation: retired   Tobacco Use   Smoking status: Never    Passive exposure: Never   Smokeless tobacco: Never   Tobacco comments:    Verified by Daughter, Kathryn Beck  Vaping Use   Vaping status: Never Used  Substance and Sexual Activity   Alcohol use: No   Drug use: No   Sexual activity: Not Currently    Partners: Male  Other Topics Concern   Not on file  Social History Narrative   Lives alone with Beck    Right handed   Caffeine-1/2 daily   Social Drivers of Health   Financial Resource Strain:  Low Risk  (05/03/2023)   Overall Financial Resource Strain (CARDIA)    Difficulty of Paying Living Expenses: Not hard at all  Food Insecurity: No Food Insecurity (10/27/2023)   Hunger Vital Sign    Worried About Running Out of Food in the Last Year: Never true    Ran Out of Food in the Last Year: Never true  Transportation Needs: No Transportation Needs (10/27/2023)   PRAPARE - Administrator, Civil Service (Medical): No    Lack of Transportation (Non-Medical): No  Physical Activity: Inactive (05/03/2023)   Exercise Vital Sign    Days of Exercise per Week: 0 days    Minutes of Exercise per Session: 0 min  Stress: No Stress Concern Present (05/03/2023)   [employer] of Occupational Health - Occupational Stress Questionnaire    Feeling of Stress : Not at all  Social Connections: Moderately Isolated (10/27/2023)   Social Connection and Isolation Panel    Frequency of Communication with Friends and Family: More than three times a week    Frequency of Social Gatherings with  Friends and Family: More than three times a week    Attends Religious Services: Never    Database administrator or Organizations: No    Attends Engineer, structural: Never    Marital Status: Married   Family History  Problem Relation Age of Onset   Hypertension Father    Hypercholesterolemia Father    Arthritis Father    Hypertension Sister    Hypercholesterolemia Sister    Breast cancer Sister    Hypertension Sister    Colon cancer Neg Hx    Colon polyps Neg Hx    - negative except otherwise stated in the family history section Allergies  Allergen Reactions   Ace Inhibitors Anaphylaxis and Swelling   Penicillins Itching and Swelling    Has tolerated cefazolin  on multiple occasions    Statins Other (See Comments)    elevated LFT's     Albuterol  Swelling   Prior to Admission medications   Medication Sig Start Date End Date Taking? Authorizing Provider  acetaminophen  (TYLENOL ) 650 MG CR tablet Take 1,300 mg by mouth every 8 (eight) hours as needed for pain.   Yes [provider]  amLODipine  (NORVASC ) 10 MG tablet Take 0.5 tablets (5 mg total) by mouth daily. 10/28/23  Yes Pearlean Manus, MD  aspirin  81 MG chewable tablet Chew 2 tablets (162 mg total) by mouth daily. Patient taking differently: Chew 81 mg by mouth daily. 09/16/23  Yes Bevely Doffing, FNP  B Complex-C-Folic Acid  (RENA-VITE RX) 1 MG TABS Take 1 tablet by mouth daily. 04/15/22  Yes [provider]  busPIRone  (BUSPAR ) 5 MG tablet TAKE ONE TABLET BY MOUTH TWICE DAILY 10/20/23  Yes Antonetta Rollene BRAVO, MD  cefdinir  (OMNICEF ) 300 MG capsule Take 1 capsule (300 mg total) by mouth every evening for 7 days. For possible left hand infection 10/28/23 11/04/23 Yes Emokpae, Courage, MD  cinacalcet  (SENSIPAR ) 30 MG tablet Take 3 tablets (90 mg total) by mouth 3 (three) times a week. Patient taking differently: Take 90 mg by mouth every Monday, Wednesday, and Friday. 09/09/22  Yes Sheikh, Omair Latif, DO   DULoxetine  (CYMBALTA ) 60 MG capsule TAKE (1) CAPSULE BY MOUTH ONCE DAILY. 04/05/23  Yes Antonetta Rollene BRAVO, MD  ezetimibe  (ZETIA ) 10 MG tablet Take 1 tablet (10 mg total) by mouth daily. 04/27/23  Yes Antonetta Rollene BRAVO, MD  insulin  glargine, 2 Unit Dial , (TOUJEO  MAX SOLOSTAR)  300 UNIT/ML Solostar Pen Inject 30 Units into the skin daily. May need to slowly increase the dose depending upon your blood sugar, follow-up with PCP 01/01/23  Yes Trixie File, MD  metoprolol  succinate (TOPROL -XL) 50 MG 24 hr tablet TAKE (1) TABLET BY MOUTH DAILY WITH FOOD *TAKE AFTER DIALYSIS* Patient taking differently: Take 50 mg by mouth Every Tuesday,Thursday,and Saturday with dialysis. Take 50mg  (1 tablets) by mouth on non-dialysis days. (Sunday, Tuesday, Thursday, Saturday). 01/05/23  Yes Antonetta Rollene BRAVO, MD  oxyCODONE -acetaminophen  (PERCOCET/ROXICET) 5-325 MG tablet Take 1 tablet by mouth every 6 (six) hours as needed. 09/29/23  Yes Gerome Maurilio HERO, PA-C  sertraline  (ZOLOFT ) 50 MG tablet Take 50 mg by mouth daily. 10/06/23  Yes [provider]  torsemide  (DEMADEX ) 100 MG tablet Take 1 tablet (100 mg total) by mouth every Tuesday, Thursday, Saturday, and Sunday. 10/28/23  Yes Pearlean Manus, MD  [name]  500 MG chewable tablet Chew 1-2 tablets (500-1,000 mg total) by mouth See admin instructions. Take 1000mg  (2 tablets) by mouth with meals and 500mg  (1 tablet) with snacks 08/23/23  Yes Antonetta Rollene BRAVO, MD  apixaban  (ELIQUIS ) 5 MG TABS tablet Take 1 tablet (5 mg total) by mouth 2 (two) times daily. For Afib Patient not taking: Reported on 10/29/2023 10/29/23   Pearlean Manus, MD  doxycycline  (VIBRA -TABS) 100 MG tablet Take 1 tablet (100 mg total) by mouth 2 (two) times daily for 7 days. For possible left hand infection Patient not taking: Reported on 10/29/2023 10/28/23 11/04/23  Pearlean Manus, MD  FLUoxetine (PROZAC) 10 MG capsule Take 10 mg by mouth daily.    05/28/11  [provider]  glipiZIDE   (GLUCOTROL ) 10 MG tablet Take 10 mg by mouth 2 (two) times daily before a meal.    05/28/11  [provider]   CT Hand Left Wo Contrast Addendum Date: 10/29/2023 ADDENDUM REPORT: 10/29/2023 12:32 ADDENDUM: These results were called by telephone at the time of interpretation on 10/29/2023 at 12:08 pm to provider JOSEPH ZAMMIT , who verbally acknowledged these results. Electronically Signed   By: Harrietta Sherry M.D.   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 Blisters on the posterior left hand. Pain. EXAM: CT OF THE LEFT HAND WITHOUT CONTRAST TECHNIQUE: Multidetector CT imaging of the left hand was performed according to the standard protocol. Multiplanar CT image reconstructions were also generated. RADIATION DOSE REDUCTION: This exam was performed according to the departmental dose-optimization program which includes automated exposure control, adjustment of the mA and/or kV according to patient size and/or use of iterative reconstruction technique. COMPARISON:  Left hand radiograph dated 11/10/2023. FINDINGS: Bones/Joint/Cartilage No acute fracture or dislocation. Status post amputation of the third proximal, middle, and distal phalanges. No evidence of acute osteolysis or erosive changes. Mild degenerative arthropathy throughout the hand. Ligaments Ligaments are suboptimally evaluated by CT. Soft tissue / Muscles and Tendons Blistering along the dorsal hand. Marked subcutaneous edema along the dorsal hand extending to the underlying extensor digitorum tendons with numerous scattered foci of soft tissue gas. Surrounding fat stranding. No discrete loculated fluid collection. Peripheral vascular calcifications. IMPRESSION: 1. Blistering and marked subcutaneous edema with stranding along the dorsal hand extending to the underlying extensor digitorum tendons with numerous scattered foci of soft tissue gas. These findings are concerning  for gas-forming soft tissue infection. No discrete loculated fluid collection. 2. No acute osseous abnormality. 3. Status post amputation of the third digit. Electronically Signed: By: Harrietta  Lateef M.D. On: 10/29/2023 12:02   DG Hand Complete Left Result Date: 10/29/2023 CLINICAL DATA:  Pain. EXAM: LEFT HAND - COMPLETE 3+ VIEW COMPARISON:  None Available. FINDINGS: There is amputation of the middle finger at the level of metacarpophalangeal joint. No acute fracture or dislocation. No aggressive osseous lesion. 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 No periarticular osteopenia or bony erosions. No radiopaque foreign bodies. There is diffuse soft tissue swelling over the dorsum of the hand. There are few foci of air within the soft tissue. However, underlying bones appear intact. No focal bone erosions. IMPRESSION: 1. No acute osseous abnormality of the left hand. 2. Amputation of middle finger at the level of metacarpophalangeal joint. 3. There is diffuse soft tissue swelling over the dorsum of the hand. There are few foci of air within the soft tissue. However, underlying bones appear intact. No focal bone erosions. Electronically Signed   By: Ree Molt M.D.   On: 10/29/2023 10:09   US  LT UPPER EXTREM LTD SOFT TISSUE NON VASCULAR Result Date: 10/28/2023 CLINICAL DATA:  Swelling. Amputation of the left long finger on 09/29/2023. EXAM: ULTRASOUND left UPPER EXTREMITY LIMITED TECHNIQUE: Ultrasound examination of the upper extremity soft tissues was performed in the area of clinical concern. COMPARISON:  None Available. FINDINGS: Targeted sonographic evaluation of the dorsal left hand corresponding to the area of clinical concern demonstrates diffuse subcutaneous edema and ill-defined fluid without discrete loculated fluid collection. Irregular small echogenic foci with areas of dirty shadowing in the  subcutaneous soft tissues could reflect soft tissue air. These findings may be secondary to postsurgical change, however, soft tissue infection with cellulitis and possible soft tissue gas cannot be excluded. IMPRESSION: Targeted sonographic evaluation of the dorsal left hand corresponding to the area of clinical concern demonstrates diffuse subcutaneous edema and ill-defined fluid without discrete loculated fluid collection. Irregular small echogenic foci with areas of dirty shadowing in the subcutaneous soft tissues could reflect soft tissue air. These findings may be secondary to postsurgical change, however, soft tissue infection with cellulitis and possible soft tissue gas cannot be excluded. Consider further evaluation with cross-sectional imaging such as contrast-enhanced CT or MRI. Electronically Signed   By: Harrietta Sherry M.D.   On: 10/28/2023 15:04   - Positive ROS: All other systems have been reviewed and were otherwise negative with the exception of those mentioned in the HPI and as above.  Physical Exam: General: No acute distress, resting comfortably  Left upper Extremity  Moderate swelling over the dorsal aspect of the left hand, no significant crepitus appreciated, there is some blistering of the skin dorsally, minimal erythema, mild warmth, no erythema tracking up the forearm, pain is limited to the dorsal aspect of the hand  She is able to perform some digital range of motion, able to tolerate passive extension and flexion, flexion is limited with composite fist secondary to pain along the backside of the hand    Assessment: 68 year old female with notable pain and swelling over the dorsal aspect of the hand in the setting of recent ray resection of the left long finger  Plan: Imaging workup was reviewed in detail, including CT scan.  I discussed this at length with Dr. Harden as well.  The subcutaneous air is likely postoperative in nature, there is evidence of some blistering  along the dorsal aspect of the hand, there is no significant crepitus or discoloration.  No signs of systemic symptoms at this  time.  Patient is currently stable and does not require emergent operation.  With make n.p.o. at midnight tonight pending repeat exam in the morning to ensure ongoing improvement.  Continue broad-spectrum antibiotics per the medical team.  Hand surgery will be available as needed.  There is a possibility for requirement for amputation at the hand or wrist level depending on the outcome, particular given her history and comorbidities.  Discussed with Dr. Harden.  Will see and evaluate the patient in the morning.  Thank you for the consult and the opportunity to see Ms. Hindley  Viana Sleep Afton Alderton, M.D. [facility] 4:29 PM

## 2023-10-29 NOTE — ED Provider Notes (Signed)
 [facility] AT [facility] Provider Note   CSN: [id number] Arrival date & time: 10/29/23  9149     Patient presents with: Blister   Kathryn Beck is a 68 y.o. female history of ESRD on HD, diabetes, aortic stenosis, CHF discoid lupus, PAD, status post left long finger amputation in the setting of gangrene on 7/9 by Dr. Harden presents with with complaints of left hand pain.  Patient was discharged yesterday a course of Omnicef  and doxycycline .  Patient reports she picked up the antibiotics today.  She is scheduled for follow-up with Dr. Silvester on Monday 8/11.  Patient reports significant worsening of her pain within the past few days.  Notes development of blisters that started yesterday.  Denies any fevers or chills.  Notes discoloration of the hand that started a week ago.   HPI    Past Medical History:  Diagnosis Date   Acid reflux    Amputated left leg (HCC)    bilateral BKA   Anemia    Arthritis    Axillary masses    Soft tissue - status post excision   Back pain    CHF (congestive heart failure) (HCC)    COVID-19 virus infection 04/06/2019   Depression    End-stage renal disease (HCC)    M/W/F dialysis   Essential hypertension    Headache    years ago   History of blood transfusion    History of cardiac catheterization    Normal coronary arteries October 2020   History of claustrophobia    History of pneumonia 2019   Hypoxia 04/03/2019   Memory loss    Mixed hyperlipidemia    Obesity    Pancreatitis    Peritoneal dialysis catheter in place [facility])    Pneumonia due to COVID-19 virus 04/02/2019   Sleep apnea    Noncompliant with CPAP   Stroke (HCC)    mini stroke   Type 2 diabetes mellitus (HCC)    Past Surgical History:  Procedure Laterality Date   ABDOMINAL AORTOGRAM W/LOWER EXTREMITY N/A 04/30/2022   Procedure: ABDOMINAL AORTOGRAM W/LOWER EXTREMITY;  Surgeon: Magda Debby SAILOR, MD;  Location: [facility] INV[facility] LAB;  Service:  Cardiovascular;  Laterality: N/A;   ABDOMINAL AORTOGRAM W/LOWER EXTREMITY N/A 07/21/2022   Procedure: ABDOMINAL AORTOGRAM W/LOWER EXTREMITY;  Surgeon: Serene Gaile ORN, MD;  Location: [facility];  Service: Cardiovascular;  Laterality: N/A;   ABDOMINAL HYSTERECTOMY     ACHILLES TENDON LENGTHENING  08/15/2022   Procedure: ACHILLES TENDON LENGTHENING;  Surgeon: Silva Juliene SAUNDERS, DPM;  Location: [facility] OR;  Service: Podiatry;;   AMPUTATION Right 05/29/2022   Procedure: RIGHT BELOW THE KNEE AMPUTATION;  Surgeon: Harden Jerona GAILS, MD;  Location: [facility] OR;  Service: Orthopedics;  Laterality: Right;   AMPUTATION Left 09/04/2022   Procedure: AMPUTATION FOOT, serial irrigation;  Surgeon: Joya Stabs, DPM;  Location: [facility] OR;  Service: Podiatry;  Laterality: Left;  Surgical team to do block   AMPUTATION Left 10/07/2022   Procedure: LEFT BELOW KNEE AMPUTATION;  Surgeon: Harden Jerona GAILS, MD;  Location: [facility] OR;  Service: Orthopedics;  Laterality: Left;   AMPUTATION FINGER Left 09/29/2023   Procedure: AMPUTATION, FINGER;  Surgeon: Harden Jerona GAILS, MD;  Location: [facility] OR;  Service: Orthopedics;  Laterality: Left;  LEFT HAND LONG FINGER RAY AMPUTATION   AV FISTULA PLACEMENT Left 09/02/2017   Procedure: creation of left arm ARTERIOVENOUS (AV) FISTULA;  Surgeon: Serene Gaile ORN, MD;  Location: [facility] OR;  Service: Vascular;  Laterality: Left;   COLONOSCOPY  2008   Dr. Harvey: normal    COLONOSCOPY N/A 12/18/2016   Dr. Harvey: multiple tubular adenomas, internal hemorrhoids. Surveillance in 3 years    ESOPHAGEAL DILATION N/A 10/13/2015   Procedure: ESOPHAGEAL DILATION;  Surgeon: Claudis RAYMOND Rivet, MD;  Location: [facility];  Service: Endoscopy;  Laterality: N/A;   ESOPHAGOGASTRODUODENOSCOPY N/A 10/13/2015   Dr. Rivet: chronic gastritis on path, no H.pylori. Empiric dilation    ESOPHAGOGASTRODUODENOSCOPY N/A 12/18/2016   Dr. Harvey: mild gastritis. BRAVO study revealed uncontrolled GERD. Dysphagia secondary to uncontrolled reflux    FOOT SURGERY Bilateral    nerve     LEFT HEART CATH AND CORONARY ANGIOGRAPHY N/A 12/29/2018   Procedure: LEFT HEART CATH AND CORONARY ANGIOGRAPHY;  Surgeon: Dann Candyce RAMAN, MD;  Location: [facility] INVASIVE CV LAB;  Service: Cardiovascular;  Laterality: N/A;   LOWER EXTREMITY ANGIOGRAPHY Right 05/04/2022   Procedure: Lower Extremity Angiography;  Surgeon: Lanis Fonda BRAVO, MD;  Location: [facility] INVASIVE CV LAB;  Service: Cardiovascular;  Laterality: Right;   LUNG BIOPSY     MASS EXCISION Right 01/09/2013   Procedure: EXCISION OF NEOPLASM OF RIGHT  AXILLA  AND EXCISION OF NEOPLASM OF LEFT AXILLA;  Surgeon: Oneil DELENA Budge, MD;  Location: [facility] ORS;  Service: General;  Laterality: Right;  procedure end @ 08:23   MYRINGOTOMY WITH TUBE PLACEMENT Bilateral 04/28/2017   Procedure: BILATERAL MYRINGOTOMY WITH TUBE PLACEMENT;  Surgeon: Karis Clunes, MD;  Location: [facility] OR;  Service: ENT;  Laterality: Bilateral;   PERIPHERAL VASCULAR BALLOON ANGIOPLASTY Right 05/04/2022   Procedure: PERIPHERAL VASCULAR BALLOON ANGIOPLASTY;  Surgeon: Lanis Fonda BRAVO, MD;  Location: [facility] INVASIVE CV LAB;  Service: Cardiovascular;  Laterality: Right;  PT   PERIPHERAL VASCULAR INTERVENTION Right 05/04/2022   Procedure: PERIPHERAL VASCULAR INTERVENTION;  Surgeon: Lanis Fonda BRAVO, MD;  Location: [facility] INVASIVE CV LAB;  Service: Cardiovascular;  Laterality: Right;  SFA   PERIPHERAL VASCULAR INTERVENTION Left 07/21/2022   Procedure: PERIPHERAL VASCULAR INTERVENTION;  Surgeon: Serene Gaile ORN, MD;  [facility];  Service: Cardiovascular;  Laterality: Left;   REVISION OF ARTERIOVENOUS GORETEX GRAFT Left 05/04/2018   Procedure: TRANSPOSITION OF CEPHALIC VEIN ARTERIOVENOUS FISTULA LEFT ARM;  Surgeon: Oris Krystal FALCON, MD;  Location: [facility] OR;  Service: Vascular;  Laterality: Left;   SAVORY DILATION N/A 12/18/2016   Procedure: SAVORY DILATION;  Surgeon: Harvey Margo CROME, MD;  [facility]: [facility];  Service: Endoscopy;  Laterality: N/A;    TRANSMETATARSAL AMPUTATION Left 08/15/2022   Procedure: TRANSMETATARSAL AMPUTATION;  Surgeon: Silva Juliene SAUNDERS, DPM;  Location: [facility] OR;  Service: Podiatry;  Laterality: Left;     Prior to Admission medications   Medication Sig Start Date End Date Taking? Authorizing Provider  acetaminophen  (TYLENOL ) 650 MG CR tablet Take 1,300 mg by mouth every 8 (eight) hours as needed for pain.    [provider]  amLODipine  (NORVASC ) 10 MG tablet Take 0.5 tablets (5 mg total) by mouth daily. 10/28/23   Pearlean Manus, MD  apixaban  (ELIQUIS ) 5 MG TABS tablet Take 1 tablet (5 mg total) by mouth 2 (two) times daily. For Afib 10/29/23   Pearlean Manus, MD  aspirin  81 MG chewable tablet Chew 2 tablets (162 mg total) by mouth daily. Patient taking differently: Chew 81 mg by mouth daily. 09/16/23   Bevely Doffing, FNP  B Complex-C-Folic Acid  (RENA-VITE RX) 1 MG TABS Take 1 tablet by mouth daily. 04/15/22   [provider]  busPIRone  (BUSPAR ) 5 MG tablet TAKE ONE TABLET BY MOUTH TWICE DAILY 10/20/23   Antonetta Rollene BRAVO, MD  cefdinir  (OMNICEF ) 300 MG capsule Take 1 capsule (300 mg total) by mouth every evening for 7 days. For possible left hand infection 10/28/23 11/04/23  Pearlean Manus, MD  cinacalcet  (SENSIPAR ) 30 MG tablet Take 3 tablets (90 mg total) by mouth 3 (three) times a week. Patient taking differently: Take 90 mg by mouth every Monday, Wednesday, and Friday. 09/09/22   Sheikh, Alejandro Latif, DO  doxycycline  (VIBRA -TABS) 100 MG tablet Take 1 tablet (100 mg total) by mouth 2 (two) times daily for 7 days. For possible left hand infection 10/28/23 11/04/23  Pearlean Manus, MD  DULoxetine  (CYMBALTA ) 60 MG capsule TAKE (1) CAPSULE BY MOUTH ONCE DAILY. 04/05/23   Antonetta Rollene BRAVO, MD  ezetimibe  (ZETIA ) 10 MG tablet Take 1 tablet (10 mg total) by mouth daily. 04/27/23   Antonetta Rollene BRAVO, MD  insulin  glargine, 2 Unit Dial , (TOUJEO  MAX SOLOSTAR) 300 UNIT/ML Solostar Pen Inject 30 Units into the skin  daily. May need to slowly increase the dose depending upon your blood sugar, follow-up with PCP 01/01/23   Trixie File, MD  metoprolol  succinate (TOPROL -XL) 50 MG 24 hr tablet TAKE (1) TABLET BY MOUTH DAILY WITH FOOD *TAKE AFTER DIALYSIS* Patient taking differently: Take 50 mg by mouth Every Tuesday,Thursday,and Saturday with dialysis. Non Dialysis including sunday 01/05/23   Antonetta Rollene BRAVO, MD  oxyCODONE -acetaminophen  (PERCOCET/ROXICET) 5-325 MG tablet Take 1 tablet by mouth every 6 (six) hours as needed. 09/29/23   Gerome Maurilio HERO, PA-C  sertraline  (ZOLOFT ) 50 MG tablet Take 50 mg by mouth daily. 10/06/23   [provider]  torsemide  (DEMADEX ) 100 MG tablet Take 1 tablet (100 mg total) by mouth every Tuesday, Thursday, Saturday, and Sunday. 10/28/23   Pearlean Manus, MD  [name]  500 MG chewable tablet Chew 1-2 tablets (500-1,000 mg total) by mouth See admin instructions. Take 1000mg  (2 tablets) by mouth with meals and 500mg  (1 tablet) with snacks 08/23/23   Antonetta Rollene BRAVO, MD  FLUoxetine (PROZAC) 10 MG capsule Take 10 mg by mouth daily.    05/28/11  [provider]  glipiZIDE  (GLUCOTROL ) 10 MG tablet Take 10 mg by mouth 2 (two) times daily before a meal.    05/28/11  [provider]    Allergies: Ace inhibitors, Penicillins, Statins, and Albuterol     Review of Systems  Musculoskeletal:  Positive for myalgias.    Updated Vital Signs BP (!) 145/71   Pulse 81   Temp 98.2 F (36.8 C) (Oral)   Resp 16   Ht 5' 5 (1.651 m)   Wt 76 kg   SpO2 98%   BMI 27.88 kg/m   Physical Exam Vitals and nursing note reviewed.  Constitutional:      Appearance: She is well-developed.     Comments: Patient appears uncomfortable, does not tolerate exam of the left upper extremity  HENT:     Head: Normocephalic and atraumatic.  Eyes:     Conjunctiva/sclera: Conjunctivae normal.  Cardiovascular:     Rate and Rhythm: Normal rate and regular rhythm.     Heart sounds: No  murmur heard. Pulmonary:     Effort: Pulmonary effort is normal. No respiratory distress.     Breath sounds: Normal breath sounds.  Abdominal:     Palpations: Abdomen is soft.     Tenderness: There is no abdominal tenderness.  Musculoskeletal:     Cervical back: Neck supple.  Comments: Left upper extremity status post middle finger amputation, sutures are in place without any evidence of dehiscence or drainage, no surrounding erythema or warmth, there is swelling involving the hand with dorsal discoloration and blisters noted, no focal tenderness, does not tolerate making a full fist, no crepitus, compartments are soft, radial pulses 2+  Skin:    General: Skin is warm and dry.     Capillary Refill: Capillary refill takes less than 2 seconds.  Neurological:     Mental Status: She is alert.  Psychiatric:        Mood and Affect: Mood normal.        (all labs ordered are listed, but only abnormal results are displayed) Labs Reviewed  CBC WITH DIFFERENTIAL/PLATELET - Abnormal; Notable for the following components:      Result Value   WBC 19.3 (*)    Hemoglobin 9.9 (*)    HCT 32.8 (*)    MCV 78.3 (*)    MCH 23.6 (*)    RDW 17.9 (*)    nRBC 0.3 (*)    Neutro Abs 16.5 (*)    Monocytes Absolute 1.4 (*)    Abs Immature Granulocytes 0.18 (*)    All other components within normal limits  COMPREHENSIVE METABOLIC PANEL WITH GFR - Abnormal; Notable for the following components:   BUN 34 (*)    Creatinine, Ser 3.99 (*)    Calcium  8.4 (*)    Albumin  2.7 (*)    Alkaline Phosphatase 136 (*)    GFR, Estimated 12 (*)    Anion gap 17 (*)    All other components within normal limits  SEDIMENTATION RATE - Abnormal; Notable for the following components:   Sed Rate 65 (*)    All other components within normal limits  CULTURE, BLOOD (ROUTINE X 2)  CULTURE, BLOOD (ROUTINE X 2)  LACTIC ACID, PLASMA  C-REACTIVE PROTEIN    EKG: None  Radiology: CT Hand Left Wo Contrast Result Date:  10/29/2023 CLINICAL DATA:  History of middle finger amputation on 09/29/2023. Blisters on the posterior left hand. Pain. EXAM: CT OF THE LEFT HAND WITHOUT CONTRAST TECHNIQUE: Multidetector CT imaging of the left hand was performed according to the standard protocol. Multiplanar CT image reconstructions were also generated. RADIATION DOSE REDUCTION: This exam was performed according to the departmental dose-optimization program which includes automated exposure control, adjustment of the mA and/or kV according to patient size and/or use of iterative reconstruction technique. COMPARISON:  Left hand radiograph dated 11/10/2023. FINDINGS: Bones/Joint/Cartilage No acute fracture or dislocation. Status post amputation of the third proximal, middle, and distal phalanges. No evidence of acute osteolysis or erosive changes. Mild degenerative arthropathy throughout the hand. Ligaments Ligaments are suboptimally evaluated by CT. Soft tissue / Muscles and Tendons Blistering along the dorsal hand. Marked subcutaneous edema along the dorsal hand extending to the underlying extensor digitorum tendons with numerous scattered foci of soft tissue gas. Surrounding fat stranding. No discrete loculated fluid collection. Peripheral vascular calcifications. IMPRESSION: 1. Blistering and marked subcutaneous edema with stranding along the dorsal hand extending to the underlying extensor digitorum tendons with numerous scattered foci of soft tissue gas. These findings are concerning for gas-forming soft tissue infection. No discrete loculated fluid collection. 2. No acute osseous abnormality. 3. Status post amputation of the third digit. Electronically Signed   By: Harrietta Sherry M.D.   On: 10/29/2023 12:02   DG Hand Complete Left Result Date: 10/29/2023 CLINICAL DATA:  Pain. EXAM: LEFT HAND - COMPLETE 3+  VIEW COMPARISON:  None Available. FINDINGS: There is amputation of the middle finger at the level of metacarpophalangeal joint. No  acute fracture or dislocation. No aggressive osseous lesion. 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 No periarticular osteopenia or bony erosions. No radiopaque foreign bodies. There is diffuse soft tissue swelling over the dorsum of the hand. There are few foci of air within the soft tissue. However, underlying bones appear intact. No focal bone erosions. IMPRESSION: 1. No acute osseous abnormality of the left hand. 2. Amputation of middle finger at the level of metacarpophalangeal joint. 3. There is diffuse soft tissue swelling over the dorsum of the hand. There are few foci of air within the soft tissue. However, underlying bones appear intact. No focal bone erosions. Electronically Signed   By: Ree Molt M.D.   On: 10/29/2023 10:09   US  LT UPPER EXTREM LTD SOFT TISSUE NON VASCULAR Result Date: 10/28/2023 CLINICAL DATA:  Swelling. Amputation of the left long finger on 09/29/2023. EXAM: ULTRASOUND left UPPER EXTREMITY LIMITED TECHNIQUE: Ultrasound examination of the upper extremity soft tissues was performed in the area of clinical concern. COMPARISON:  None Available. FINDINGS: Targeted sonographic evaluation of the dorsal left hand corresponding to the area of clinical concern demonstrates diffuse subcutaneous edema and ill-defined fluid without discrete loculated fluid collection. Irregular small echogenic foci with areas of dirty shadowing in the subcutaneous soft tissues could reflect soft tissue air. These findings may be secondary to postsurgical change, however, soft tissue infection with cellulitis and possible soft tissue gas cannot be excluded. IMPRESSION: Targeted sonographic evaluation of the dorsal left hand corresponding to the area of clinical concern demonstrates diffuse subcutaneous edema and ill-defined fluid without discrete loculated fluid collection. Irregular small echogenic foci with  areas of dirty shadowing in the subcutaneous soft tissues could reflect soft tissue air. These findings may be secondary to postsurgical change, however, soft tissue infection with cellulitis and possible soft tissue gas cannot be excluded. Consider further evaluation with cross-sectional imaging such as contrast-enhanced CT or MRI. Electronically Signed   By: Harrietta Sherry M.D.   On: 10/28/2023 15:04     Procedures   Medications Ordered in the ED  vancomycin  (VANCOREADY) IVPB 1500 mg/300 mL (1,500 mg Intravenous New Bag/Given 10/29/23 1136)  morphine  (PF) 4 MG/ML injection 4 mg (4 mg Intravenous Given 10/29/23 0956)  ceFEPIme  (MAXIPIME ) 2 g in sodium chloride  0.9 % 100 mL IVPB (2 g Intravenous New Bag/Given 10/29/23 1139)  sodium chloride  0.9 % bolus 1,000 mL (1,000 mLs Intravenous New Bag/Given 10/29/23 1157)    Clinical Course as of 10/29/23 1231  Fri Oct 29, 2023  1151 DG Hand Complete Left [JT]    Clinical Course User Index [JT] Donnajean Lynwood DEL, PA-C                                 Medical Decision Making  This patient presents to the ED with chief complaint(s) of hand pain.  The complaint involves an extensive differential diagnosis and also carries with it a high risk of complications and morbidity.   Pertinent past medical history as listed in HPI  The differential diagnosis includes  Necrotizing fasciitis, cellulitis, abscess Additional history obtained: Records reviewed Care Everywhere/External Records  Assessment and management:   Patient presents with complaints of left hand pain.  She is status post left middle finger amputation instead of  gangrene with Dr. Harden on 7/23.  She is here with worsening pain over the past week with new blisters that started this morning.  Initial examination patient appears uncomfortable.  Tolerates minimal exam of her left hand.  She was unable to complete her dialysis session this morning.  Her exam is concerning for recurrent infection.  Lab  work notable for increasing leukocytosis and CT imaging concerning for soft tissue gas-forming infection.  Vitals are stable.  Empiric antibiotics started.  Discussed patient with Ortho team.  Recommended hospitalist admission with Ortho evaluation.  Independent ECG interpretation:  none  Independent labs interpretation:  The following labs were independently interpreted:  CBC with leukocytosis of 19.3, CMP with creatinine 3.9, potassium within normal limits, anion gap of 17, lactic within normal limits, sed rate 65, CRP within normal limits  Independent visualization and interpretation of imaging: I independently visualized the following imaging with scope of interpretation limited to determining acute life threatening conditions related to emergency care:  Hand xray no osseous abnormality, diffuse soft tissue swelling with few foci of air within the soft tissue CT with numerous scattered foci of soft tissue gas concerning for gas-forming soft tissue infection, no loculated fluid collection   Consultations obtained:   Ortho Dr. Harden, Genelle, and Agarwala aware of patient. Dr Arlinda will evaluate upon arrival to [facility]. Hospitalist Dr. Evonnie agreed for admission  Disposition:   Patient will be admitted and transfer to [facility] for further evaluation and management  Social Determinants of Health:   none  This note was dictated with voice recognition software.  Despite best efforts at proofreading, errors may have occurred which can change the documentation meaning.       Final diagnoses:  Left hand pain    ED Discharge Orders     None          Donnajean Lynwood VEAR DEVONNA 10/29/23 1231    Suzette Pac, MD 10/30/23 [phone]

## 2023-10-29 NOTE — Hospital Course (Addendum)
 Brief Narrative:   68 year old female with a history of newly diagnosed atrial fib on apixaban , PAD  (s/p R BKA in 05/2022, prior lithotripsy and stenting of the left superficial femoral and popliteal artery in 06/2022 and ultimately L BKA in 09/2022), HTN, HLD, Type 2 DM, bilateral adrenal adenoma (followed by Endocrinology), chronic HFpEF, aortic stenosis, OSA, ESRD (MWF), history of TIA's, history of recurrent chest pain  (cath in 12/2018 showing normal coronary arteries) presenting with left hand pain and blisters.    Now s/p L hand and forearm I&D.  ID c/s for abx.   Discussed with orthopedic who plans on keeping the drain for now and following up outpatient.  ID and nephrology to arrange for outpatient antibiotics for next 3 weeks.  Hospital bed has been arranged.  Medically stable for discharge.    Assessment & Plan:  Principal Problem:   Abscess of left hand Active Problems:   Left hand pain    Left hand infection with concerns of abscess requiring I&D MSSA infection - CT with blistering and marked subcutaneous edema with stranding along the dorsal hand extending to the underlying extensor digitorum tendons with numerous scattered foci of soft tissue gas.  Seen by infectious disease, recommending cefazolin  renally dosed.  Anticipate 2-3-week treatment. - Wound care management per orthopedic: daily dressing changes, with Vashe if possible.    Paroxysmal atrial fibrillation  RVR -Currently on metoprolol .  Amiodarone  stopped.  Continue Eliquis    Acute Metabolic Encephalopathy Resolved, suspect related to IV fentanyl   Follow lactic acid wnl, VBG (no hypercarbia), ammonia wnl,  b12 elevated, folate - wnl Delirium precautions Additional w/u if not improving   ESRD  Hyperkalemia - volume per renal  - on T/Th/S/Su torsemide  - hyperkalemia management per renal    Essential hypertension - Continue metoprolol  succinate (non dialysis days) and amlodipine    Anxiety/depression -  Cymbalta , sertraline , buspar    Diabetes mellitus type 2 with hyperglycemia - NovoLog  sliding scale - Reduced dose of semaglutide  - 10/27/2023 hemoglobin A1c 6.7   PAD Status post bilateral BKA - s/p Stent, left superficial femoral and popliteal artery 07/21/22 ([facility]) - Continue aspirin , heparin  with encephalopathy        DVT prophylaxis: heparin  Code Status: full Family Communication: daughter updated.  Disposition:  Plans for dc today   Subjective:  Doing ok, no complaints.  Ok to go home.   Examination:  General exam: Appears calm and comfortable  Respiratory system: Clear to auscultation. Respiratory effort normal. Cardiovascular system: S1 & S2 heard, RRR. No JVD, murmurs, rubs, gallops or clicks. No pedal edema. Gastrointestinal system: Abdomen is nondistended, soft and nontender. No organomegaly or masses felt. Normal bowel sounds heard. Central nervous system: Alert and oriented x 2-3. No focal neurological deficits. Extremities: Symmetric 5 x 5 power. Skin: No rashes, lesions or ulcers Psychiatry: Judgement and insight appear normal. Mood & affect appropriate. LUE dressing in place.

## 2023-10-29 NOTE — H&P (Addendum)
 History and Physical    Patient: Kathryn Beck FMW:[id number] DOB: May 22, 1955 DOA: 10/29/2023 DOS: the patient was seen and examined on 10/29/2023 PCP: Antonetta Rollene BRAVO, MD  Patient coming from: Home  Chief Complaint:  Chief Complaint  Patient presents with   Blister   HPI: Kathryn Beck is a 68 year old female with a history of newly diagnosed atrial fib on apixaban , PAD  (s/p R BKA in 05/2022, prior lithotripsy and stenting of the left superficial femoral and popliteal artery in 06/2022 and ultimately L BKA in 09/2022), HTN, HLD, Type 2 DM, bilateral adrenal adenoma (followed by Endocrinology), chronic HFpEF, aortic stenosis, OSA, ESRD (MWF), history of TIA's, history of recurrent chest pain  (cath in 12/2018 showing normal coronary arteries) presenting with left hand pain and blisters. The patient was recently discharged from the hospital after a stay from 10/27/2023 to 10/28/2023 for new onset atrial fibrillation.  She was seen by cardiology, and she underwent cardioversion at that time converting back to sinus rhythm.  She was discharged home on apixaban  and aspirin .  Her Plavix  was discontinued after discussion with cardiology and Dr. Serene. She had an ultrasound of her left hand at that time prior to discharge which did not show any fluid collection.  She was discharged home with cefdinir  and doxycycline .  She stated that she took the 8/7 evening dose of doxycycline  and the morning dose on 10/29/2023.  Notably, she had amputation of her left middle finger performed by Dr. Harden on 09/29/2023. She initially noticed a blister on the dorsum of her left hand on the evening of 10/28/2023.  She went to dialysis on the morning of 10/29/2023.  She had worsening pain requesting to start dialysis.  She was subsequently sent to the emergency department for further evaluation treatment. She was only able to finish 1-1/2 hours of her dialysis treatment today.  She has subjective fever and chills.  She denied  any current chest pain, shortness breath, nausea, vomiting, diarrhea, abdominal pain.  In the ED, the patient was afebrile hemodynamically stable with oxygen  saturation 97% on room air.  WBC 19.3, hemoglobin 9.9, platelet 381.  Sodium 140, potassium 4.1, bicarbonate 25, serum creatinine 3.99.  LFTs unremarkable.  X-ray of the left hand shows diffuse edema in the dorsum of the hand with some foci of air in the soft tissues. CT of the left hand was obtained, but results are pending at the time of this evaluation. EDP spoke with Dr. Herma and/or one of his associates will evaluate and consult after transfer to [facility]    Review of Systems: As mentioned in the history of present illness. All other systems reviewed and are negative. Past Medical History:  Diagnosis Date   Acid reflux    Amputated left leg (HCC)    bilateral BKA   Anemia    Arthritis    Axillary masses    Soft tissue - status post excision   Back pain    CHF (congestive heart failure) (HCC)    COVID-19 virus infection 04/06/2019   Depression    End-stage renal disease (HCC)    M/W/F dialysis   Essential hypertension    Headache    years ago   History of blood transfusion    History of cardiac catheterization    Normal coronary arteries October 2020   History of claustrophobia    History of pneumonia 2019   Hypoxia 04/03/2019   Memory loss    Mixed hyperlipidemia    Obesity  Pancreatitis    Peritoneal dialysis catheter in place [facility])    Pneumonia due to COVID-19 virus 04/02/2019   Sleep apnea    Noncompliant with CPAP   Stroke (HCC)    mini stroke   Type 2 diabetes mellitus (HCC)    Past Surgical History:  Procedure Laterality Date   ABDOMINAL AORTOGRAM W/LOWER EXTREMITY N/A 04/30/2022   Procedure: ABDOMINAL AORTOGRAM W/LOWER EXTREMITY;  Surgeon: Magda Debby SAILOR, MD;  Location: [facility] INVASIVE CV LAB;  Service: Cardiovascular;  Laterality: N/A;   ABDOMINAL AORTOGRAM W/LOWER EXTREMITY N/A 07/21/2022    Procedure: ABDOMINAL AORTOGRAM W/LOWER EXTREMITY;  Surgeon: Serene Gaile ORN, MD;  Location: [facility] INV[facility] CV LAB;  Service: Cardiovascular;  Laterality: N/A;   ABDOMINAL HYSTERECTOMY     ACHILLES TENDON LENGTHENING  08/15/2022   Procedure: ACHILLES TENDON LENGTHENING;  Surgeon: Silva Juliene SAUNDERS, DPM;  Location: [facility] OR;  Service: Podiatry;;   AMPUTATION Right 05/29/2022   Procedure: RIGHT BELOW THE KNEE AMPUTATION;  Surgeon: Harden Jerona GAILS, MD;  Location: [facility] OR;  Service: Orthopedics;  Laterality: Right;   AMPUTATION Left 09/04/2022   Procedure: AMPUTATION FOOT, serial irrigation;  Surgeon: Joya Stabs, DPM;  Location: [facility] OR;  Service: Podiatry;  Laterality: Left;  Surgical team to do block   AMPUTATION Left 10/07/2022   Procedure: LEFT BELOW KNEE AMPUTATION;  Surgeon: Harden Jerona GAILS, MD;  Location[facility] OR;  Service: Orthopedics;  Laterality: Left;   AMPUTATION FINGER Left 09/29/2023   Procedure: AMPUTATION, FINGER;  Surgeon: Harden Jerona GAILS, MD;  Location: [facility] OR;  Service: Orthopedics;  Laterality: Left;  LEFT HAND LONG FINGER RAY AMPUTATION   AV FISTULA PLACEMENT Left 09/02/2017   Procedure: creation of left arm ARTERIOVENOUS (AV) FISTULA;  Surgeon: Serene Gaile ORN, MD;  Location: [facility] OR;  Service: Vascular;  Laterality: Left;   COLONOSCOPY  2008   Dr. Harvey: normal    COLONOSCOPY N/A 12/18/2016   Dr. Harvey: multiple tubular adenomas, internal hemorrhoids. Surveillance in 3 years    ESOPHAGEAL DILATION N/A 10/13/2015   Procedure: ESOPHAGEAL DILATION;  Surgeon: Claudis RAYMOND Rivet, MD;  Location: [facility];  Service: Endoscopy;  Laterality: N/A;   ESOPHAGOGASTRODUODENOSCOPY N/A 10/13/2015   Dr. Rivet: chronic gastritis on path, no H.pylori. Empiric dilation    ESOPHAGOGASTRODUODENOSCOPY N/A 12/18/2016   Dr. Harvey: mild gastritis. BRAVO study revealed uncontrolled GERD. Dysphagia secondary to uncontrolled reflux   FOOT SURGERY Bilateral    nerve     LEFT HEART CATH AND CORONARY ANGIOGRAPHY N/A  12/29/2018   Procedure: LEFT HEART CATH AND CORONARY ANGIOGRAPHY;  Surgeon: Dann Candyce RAMAN, MD;  Location: [facility] INVASIVE CV LAB;  Service: Cardiovascular;  Laterality: N/A;   LOWER EXTREMITY ANGIOGRAPHY Right 05/04/2022   Procedure: Lower Extremity Angiography;  Surgeon: Lanis Fonda BRAVO, MD;  Location: [facility] INVASIVE CV LAB;  Service: Cardiovascular;  Laterality: Right;   LUNG BIOPSY     MASS EXCISION Right 01/09/2013   Procedure: EXCISION OF NEOPLASM OF RIGHT  AXILLA  AND EXCISION OF NEOPLASM OF LEFT AXILLA;  Surgeon: Oneil DELENA Budge, MD;  Location: [facility] ORS;  Service: General;  Laterality: Right;  procedure end @ 08:23   MYRINGOTOMY WITH TUBE PLACEMENT Bilateral 04/28/2017   Procedure: BILATERAL MYRINGOTOMY WITH TUBE PLACEMENT;  Surgeon: Karis Clunes, MD;  Location: [facility] OR;  Service: ENT;  Laterality: Bilateral;   PERIPHERAL VASCULAR BALLOON ANGIOPLASTY Right 05/04/2022   Procedure: PERIPHERAL VASCULAR BALLOON ANGIOPLASTY;  Surgeon: Lanis Fonda BRAVO, MD;  Location: [facility] INVASIVE CV LAB;  Service: Cardiovascular;  Laterality: Right;  PT   PERIPHERAL VASCULAR INTERVENTION Right 05/04/2022   Procedure: PERIPHERAL VASCULAR INTERVENTION;  Surgeon: Lanis Fonda BRAVO, MD;  Location: [facility] INVASIVE CV LAB;  Service: Cardiovascular;  Laterality: Right;  SFA   PERIPHERAL VASCULAR INTERVENTION Left 07/21/2022   Procedure: PERIPHERAL VASCULAR INTERVENTION;  Surgeon: Serene Gaile ORN, MD;  [facility]: [facility] LAB;  Service: Cardiovascular;  Laterality: Left;   REVISION OF ARTERIOVENOUS GORETEX GRAFT Left 05/04/2018   Procedure: TRANSPOSITION OF CEPHALIC VEIN ARTERIOVENOUS FISTULA LEFT ARM;  Surgeon: Oris Krystal FALCON, MD;  Location: [facility] OR;  Service: Vascular;  Laterality: Left;   SAVORY DILATION N/A 12/18/2016   Procedure: SAVORY DILATION;  Surgeon: Harvey Margo CROME, MD;  Location: [facility];  Service: Endoscopy;  Laterality: N/A;   TRANSMETATARSAL AMPUTATION Left 08/15/2022   Procedure: TRANSMETATARSAL AMPUTATION;  Surgeon:  Silva Juliene SAUNDERS, DPM;  Location: [facility] OR;  Service: Podiatry;  Laterality: Left;   Social History:  reports that she has never smoked. She has never been exposed to tobacco smoke. She has never used smokeless tobacco. She reports that she does not drink alcohol and does not use drugs.  Allergies  Allergen Reactions   Ace Inhibitors Anaphylaxis and Swelling   Penicillins Itching and Swelling    Has tolerated cefazolin  on multiple occasions    Statins Other (See Comments)    elevated LFT's     Albuterol  Swelling    Family History  Problem Relation Age of Onset   Hypertension Father    Hypercholesterolemia Father    Arthritis Father    Hypertension Sister    Hypercholesterolemia Sister    Breast cancer Sister    Hypertension Sister    Colon cancer Neg Hx    Colon polyps Neg Hx     Prior to Admission medications   Medication Sig Start Date End Date Taking? Authorizing Provider  acetaminophen  (TYLENOL ) 650 MG CR tablet Take 1,300 mg by mouth every 8 (eight) hours as needed for pain.    [provider]  amLODipine  (NORVASC ) 10 MG tablet Take 0.5 tablets (5 mg total) by mouth daily. 10/28/23   Pearlean Manus, MD  apixaban  (ELIQUIS ) 5 MG TABS tablet Take 1 tablet (5 mg total) by mouth 2 (two) times daily. For Afib 10/29/23   Pearlean Manus, MD  aspirin  81 MG chewable tablet Chew 2 tablets (162 mg total) by mouth daily. Patient taking differently: Chew 81 mg by mouth daily. 09/16/23   Bevely Doffing, FNP  B Complex-C-Folic Acid  (RENA-VITE RX) 1 MG TABS Take 1 tablet by mouth daily. 04/15/22   [provider]  busPIRone  (BUSPAR ) 5 MG tablet TAKE ONE TABLET BY MOUTH TWICE DAILY 10/20/23   Antonetta Rollene BRAVO, MD  cefdinir  (OMNICEF ) 300 MG capsule Take 1 capsule (300 mg total) by mouth every evening for 7 days. For possible left hand infection 10/28/23 11/04/23  Pearlean Manus, MD  cinacalcet  (SENSIPAR ) 30 MG tablet Take 3 tablets (90 mg total) by mouth 3 (three) times a  week. Patient taking differently: Take 90 mg by mouth every Monday, Wednesday, and Friday. 09/09/22   Sheikh, Alejandro Latif, DO  doxycycline  (VIBRA -TABS) 100 MG tablet Take 1 tablet (100 mg total) by mouth 2 (two) times daily for 7 days. For possible left hand infection 10/28/23 11/04/23  Pearlean Manus, MD  DULoxetine  (CYMBALTA ) 60 MG capsule TAKE (1) CAPSULE BY MOUTH ONCE DAILY. 04/05/23   Antonetta Rollene BRAVO, MD  ezetimibe  (ZETIA ) 10 MG tablet Take 1 tablet (10 mg  total) by mouth daily. 04/27/23   Antonetta Rollene BRAVO, MD  insulin  glargine, 2 Unit Dial , (TOUJEO  MAX SOLOSTAR) 300 UNIT/ML Solostar Pen Inject 30 Units into the skin daily. May need to slowly increase the dose depending upon your blood sugar, follow-up with PCP 01/01/23   Trixie File, MD  metoprolol  succinate (TOPROL -XL) 50 MG 24 hr tablet TAKE (1) TABLET BY MOUTH DAILY WITH FOOD *TAKE AFTER DIALYSIS* Patient taking differently: Take 50 mg by mouth Every Tuesday,Thursday,and Saturday with dialysis. Non Dialysis including sunday 01/05/23   Antonetta Rollene BRAVO, MD  oxyCODONE -acetaminophen  (PERCOCET/ROXICET) 5-325 MG tablet Take 1 tablet by mouth every 6 (six) hours as needed. 09/29/23   Gerome Maurilio HERO, PA-C  sertraline  (ZOLOFT ) 50 MG tablet Take 50 mg by mouth daily. 10/06/23   [provider]  torsemide  (DEMADEX ) 100 MG tablet Take 1 tablet (100 mg total) by mouth every Tuesday, Thursday, Saturday, and Sunday. 10/28/23   Pearlean Manus, MD  [name]  500 MG chewable tablet Chew 1-2 tablets (500-1,000 mg total) by mouth See admin instructions. Take 1000mg  (2 tablets) by mouth with meals and 500mg  (1 tablet) with snacks 08/23/23   Antonetta Rollene BRAVO, MD  FLUoxetine (PROZAC) 10 MG capsule Take 10 mg by mouth daily.    05/28/11  [provider]  glipiZIDE  (GLUCOTROL ) 10 MG tablet Take 10 mg by mouth 2 (two) times daily before a meal.    05/28/11  [provider]    Physical Exam: Vitals:   10/29/23 0930 10/29/23 0945  10/29/23 1137 10/29/23 1245  BP: 120/80 130/85 (!) 145/71 138/68  Pulse: 81 80 81 82  Resp: 16 15 16  (!) 22  Temp:      TempSrc:      SpO2: 100% 96% 98% 96%  Weight:      Height:       GENERAL:  A&O x 3, NAD, well developed, cooperative, follows commands HEENT: [place]/AT, No thrush, No icterus, No oral ulcers Neck:  No neck mass, No meningismus, soft, supple CV: RRR, no S3, no S4, no rub, no JVD Lungs:  CTA, no wheeze, no rhonchi, good air movement Abd: soft/NT +BS, nondistended Ext: No LE edema, no lymphangitis, no cyanosis, no rashes; bilateral BKA's without any open wounds Neuro:  CN II-XII intact, strength 4/5 in RUE, RLE, strength 4/5 LUE, LLE; sensation intact bilateral; no dysmetria; babinski equivocal  - Left hand with fluid blisters on the dorsum of the hand.  There is edema from the fingers to the wrist.  There is no crepitus.  There is no necrosis.  No purulent drainage.  Radial pulses are present.  Sensation is intact.     Data Reviewed: Data reviewed above in the history  Assessment and Plan: Cellulitis/abscess of the left hand - Follow-up blood cultures - 10/29/23 CT hand--marked subcutaneous edema with stranding along dorsal hand extending to the underlying extensor digitorum tendons with numerous scattered foci of soft tissue gas. - Continue vancomycin  and cefepime  empirically - Dr. Neta associates has been contacted with and will consult - Avoiding morphine  secondary to ESRD - Judicious opioids  Paroxysmal atrial fibrillation - Currently in sinus rhythm - Continue metoprolol  - Holding apixaban  in anticipation the patient may need operative intervention  ESRD - Patient received 1.5 hours of dialysis on 10/29/2023 before coming to the hospital - Nephrology has been notified  Essential hypertension - Continue metoprolol  succinate and amlodipine   Anxiety/depression - Continue Cymbalta  and sertraline   Diabetes mellitus type 2 with hyperglycemia -  NovoLog   sliding scale - Reduced dose of semaglutide  - 10/27/2023 hemoglobin A1c 6.7  PAD - Status post bilateral BKA - s/p Stent, left superficial femoral and popliteal artery 07/21/22 ([facility]) - Continue aspirin  - Holding apixaban  temporarily   Advance Care Planning: FULL  Consults: ortho--Duda; renal  Family Communication: Daughter and spouse 8/8  Severity of Illness: The appropriate patient status for this patient is OBSERVATION. Observation status is judged to be reasonable and necessary in order to provide the required intensity of service to ensure the patient's safety. The patient's presenting symptoms, physical exam findings, and initial radiographic and laboratory data in the context of their medical condition is felt to place them at decreased risk for further clinical deterioration. Furthermore, it is anticipated that the patient will be medically stable for discharge from the hospital within 2 midnights of admission.   Author: Alm Schneider, MD 10/29/2023 1:15 PM  For on call review [email or web address].

## 2023-10-29 NOTE — Plan of Care (Signed)

## 2023-10-30 ENCOUNTER — Encounter (HOSPITAL_COMMUNITY): Admission: EM | Disposition: A | Discharge status: Home Health | Payer: Self-pay | Attending: Family Medicine

## 2023-10-30 ENCOUNTER — Encounter (HOSPITAL_COMMUNITY): Payer: Self-pay | Admitting: Internal Medicine

## 2023-10-30 ENCOUNTER — Other Ambulatory Visit: Payer: Self-pay

## 2023-10-30 ENCOUNTER — Inpatient Hospital Stay (HOSPITAL_COMMUNITY): Admitting: Certified Registered Nurse Anesthetist

## 2023-10-30 DIAGNOSIS — N186 End stage renal disease: Secondary | ICD-10-CM

## 2023-10-30 DIAGNOSIS — I132 Hypertensive heart and chronic kidney disease with heart failure and with stage 5 chronic kidney disease, or end stage renal disease: Secondary | ICD-10-CM

## 2023-10-30 DIAGNOSIS — I5032 Chronic diastolic (congestive) heart failure: Secondary | ICD-10-CM

## 2023-10-30 DIAGNOSIS — R2232 Localized swelling, mass and lump, left upper limb: Secondary | ICD-10-CM

## 2023-10-30 DIAGNOSIS — L02512 Cutaneous abscess of left hand: Secondary | ICD-10-CM

## 2023-10-30 HISTORY — PX: INCISION AND DRAINAGE OF WOUND: SHX1803

## 2023-10-30 LAB — RENAL FUNCTION PANEL
Albumin: 2.2 g/dL — ABNORMAL LOW (ref 3.5–5.0)
Anion gap: 15 (ref 5–15)
BUN: 43 mg/dL — ABNORMAL HIGH (ref 8–23)
CO2: 22 mmol/L (ref 22–32)
Calcium: 8.6 mg/dL — ABNORMAL LOW (ref 8.9–10.3)
Chloride: 105 mmol/L (ref 98–111)
Creatinine, Ser: 4.89 mg/dL — ABNORMAL HIGH (ref 0.44–1.00)
GFR, Estimated: 9 mL/min — ABNORMAL LOW (ref >60)
Glucose, Bld: 95 mg/dL (ref 70–99)
Phosphorus: 5.3 mg/dL — ABNORMAL HIGH (ref 2.5–4.6)
Potassium: 4.4 mmol/L (ref 3.5–5.1)
Sodium: 142 mmol/L (ref 135–145)

## 2023-10-30 LAB — CBC
HCT: 30.5 % — ABNORMAL LOW (ref 36.0–46.0)
Hemoglobin: 9.7 g/dL — ABNORMAL LOW (ref 12.0–15.0)
MCH: 24.4 pg — ABNORMAL LOW (ref 26.0–34.0)
MCHC: 31.8 g/dL (ref 30.0–36.0)
MCV: 76.8 fL — ABNORMAL LOW (ref 80.0–100.0)
Platelets: 395 K/uL (ref 150–400)
RBC: 3.97 MIL/uL (ref 3.87–5.11)
RDW: 17.7 % — ABNORMAL HIGH (ref 11.5–15.5)
WBC: 18.8 K/uL — ABNORMAL HIGH (ref 4.0–10.5)
nRBC: 0.2 % (ref 0.0–0.2)

## 2023-10-30 LAB — SURGICAL PCR SCREEN
MRSA, PCR: NEGATIVE
Staphylococcus aureus: POSITIVE — AB

## 2023-10-30 LAB — GLUCOSE, CAPILLARY
Glucose-Capillary: 90 mg/dL (ref 70–99)
Glucose-Capillary: 96 mg/dL (ref 70–99)
Glucose-Capillary: 81 mg/dL (ref 70–99)
Glucose-Capillary: 176 mg/dL — ABNORMAL HIGH (ref 70–99)
Glucose-Capillary: 258 mg/dL — ABNORMAL HIGH (ref 70–99)
Glucose-Capillary: 219 mg/dL — ABNORMAL HIGH (ref 70–99)

## 2023-10-30 SURGERY — IRRIGATION AND DEBRIDEMENT WOUND
Anesthesia: General | Laterality: Left

## 2023-10-30 MED ORDER — PHENYLEPHRINE HCL-NACL 20-0.9 MG/250ML-% IV SOLN
INTRAVENOUS | Status: DC | PRN
  Administered 2023-10-30: 50 ug/min via INTRAVENOUS

## 2023-10-30 MED ORDER — HEPARIN SODIUM (PORCINE) 5000 UNIT/ML IJ SOLN
5000.0000 [IU] | Freq: Three times a day (TID) | INTRAMUSCULAR | Status: DC
Start: 2023-10-30 — End: 2023-10-30

## 2023-10-30 MED ORDER — FENTANYL CITRATE (PF) 100 MCG/2ML IJ SOLN
25.0000 ug | INTRAMUSCULAR | Status: DC | PRN
  Administered 2023-10-30: 25 ug via INTRAVENOUS

## 2023-10-30 MED ORDER — METRONIDAZOLE 500 MG PO TABS
500.0000 mg | ORAL_TABLET | Freq: Two times a day (BID) | ORAL | Status: DC
  Administered 2023-10-30 – 2023-11-02 (×8): 500 mg via ORAL
  Filled 2023-10-30 (×6): qty 1

## 2023-10-30 MED ORDER — CHLORHEXIDINE GLUCONATE 0.12 % MT SOLN
OROMUCOSAL | Status: AC
  Filled 2023-10-30: qty 15

## 2023-10-30 MED ORDER — LIDOCAINE 2% (20 MG/ML) 5 ML SYRINGE
INTRAMUSCULAR | Status: DC | PRN
  Administered 2023-10-30: 60 mg via INTRAVENOUS

## 2023-10-30 MED ORDER — 0.9 % SODIUM CHLORIDE (POUR BTL) OPTIME
TOPICAL | Status: DC | PRN
  Administered 2023-10-30 (×2): 1000 mL

## 2023-10-30 MED ORDER — MUPIROCIN 2 % EX OINT
1.0000 | TOPICAL_OINTMENT | Freq: Two times a day (BID) | CUTANEOUS | Status: AC
  Administered 2023-10-30 – 2023-11-03 (×14): 1 via NASAL
  Filled 2023-10-30 (×2): qty 22

## 2023-10-30 MED ORDER — PROPOFOL 10 MG/ML IV BOLUS
INTRAVENOUS | Status: DC | PRN
  Administered 2023-10-30: 150 mg via INTRAVENOUS

## 2023-10-30 MED ORDER — ONDANSETRON HCL 4 MG/2ML IJ SOLN
INTRAMUSCULAR | Status: DC | PRN
  Administered 2023-10-30: 4 mg via INTRAVENOUS

## 2023-10-30 MED ORDER — LIDOCAINE 2% (20 MG/ML) 5 ML SYRINGE
INTRAMUSCULAR | Status: AC
  Filled 2023-10-30: qty 5

## 2023-10-30 MED ORDER — ACETAMINOPHEN 10 MG/ML IV SOLN
1000.0000 mg | Freq: Once | INTRAVENOUS | Status: DC | PRN
  Administered 2023-10-30: 1000 mg via INTRAVENOUS

## 2023-10-30 MED ORDER — ACETAMINOPHEN 10 MG/ML IV SOLN
INTRAVENOUS | Status: AC
  Filled 2023-10-30: qty 100

## 2023-10-30 MED ORDER — FENTANYL CITRATE (PF) 100 MCG/2ML IJ SOLN
INTRAMUSCULAR | Status: AC
Start: 2023-10-30 — End: 2023-10-30
  Filled 2023-10-30: qty 2

## 2023-10-30 MED ORDER — MIDAZOLAM HCL 2 MG/2ML IJ SOLN
INTRAMUSCULAR | Status: AC
Start: 2023-10-30 — End: 2023-10-30
  Filled 2023-10-30: qty 2

## 2023-10-30 MED ORDER — ROCURONIUM BROMIDE 10 MG/ML (PF) SYRINGE
PREFILLED_SYRINGE | INTRAVENOUS | Status: AC
  Filled 2023-10-30: qty 10

## 2023-10-30 MED ORDER — ORAL CARE MOUTH RINSE: 15.0000 mL | Freq: Once | OROMUCOSAL | Status: AC

## 2023-10-30 MED ORDER — DEXAMETHASONE SODIUM PHOSPHATE 10 MG/ML IJ SOLN
INTRAMUSCULAR | Status: DC | PRN
  Administered 2023-10-30: 5 mg via INTRAVENOUS

## 2023-10-30 MED ORDER — DEXAMETHASONE SODIUM PHOSPHATE 10 MG/ML IJ SOLN
INTRAMUSCULAR | Status: AC
  Filled 2023-10-30: qty 1

## 2023-10-30 MED ORDER — PROPOFOL 10 MG/ML IV BOLUS
INTRAVENOUS | Status: AC
  Filled 2023-10-30: qty 20

## 2023-10-30 MED ORDER — OXYCODONE HCL 5 MG/5ML PO SOLN
5.0000 mg | Freq: Once | ORAL | Status: AC | PRN
  Administered 2023-10-30: 5 mg via ORAL

## 2023-10-30 MED ORDER — DROPERIDOL 2.5 MG/ML IJ SOLN: 0.6250 mg | Freq: Once | INTRAMUSCULAR | Status: DC | PRN

## 2023-10-30 MED ORDER — CHLORHEXIDINE GLUCONATE 0.12 % MT SOLN
15.0000 mL | Freq: Once | OROMUCOSAL | Status: AC
  Administered 2023-10-30: 15 mL via OROMUCOSAL

## 2023-10-30 MED ORDER — OXYCODONE HCL 5 MG/5ML PO SOLN
ORAL | Status: AC
  Filled 2023-10-30: qty 5

## 2023-10-30 MED ORDER — OXYCODONE HCL 5 MG PO TABS: 5.0000 mg | ORAL_TABLET | Freq: Once | ORAL | Status: AC | PRN

## 2023-10-30 MED ORDER — INSULIN ASPART 100 UNIT/ML IJ SOLN: 0.0000 [IU] | INTRAMUSCULAR | Status: DC | PRN

## 2023-10-30 MED ORDER — ONDANSETRON HCL 4 MG/2ML IJ SOLN
INTRAMUSCULAR | Status: AC
  Filled 2023-10-30: qty 2

## 2023-10-30 MED ORDER — PHENYLEPHRINE 80 MCG/ML (10ML) SYRINGE FOR IV PUSH (FOR BLOOD PRESSURE SUPPORT)
PREFILLED_SYRINGE | INTRAVENOUS | Status: DC | PRN
  Administered 2023-10-30: 80 ug via INTRAVENOUS
  Administered 2023-10-30: 240 ug via INTRAVENOUS

## 2023-10-30 MED ORDER — MIDAZOLAM HCL 2 MG/2ML IJ SOLN
INTRAMUSCULAR | Status: DC | PRN
  Administered 2023-10-30: 2 mg via INTRAVENOUS

## 2023-10-30 MED ORDER — SODIUM CHLORIDE 0.9 % IV SOLN: INTRAVENOUS | Status: DC

## 2023-10-30 SURGICAL SUPPLY — 23 items
BNDG COHESIVE 4X5 TAN STRL LF (GAUZE/BANDAGES/DRESSINGS) IMPLANT
BNDG ELASTIC 4INX 5YD STR LF (GAUZE/BANDAGES/DRESSINGS) IMPLANT
BNDG GAUZE DERMACEA FLUFF 4 (GAUZE/BANDAGES/DRESSINGS) IMPLANT
COVER SURGICAL LIGHT HANDLE (MISCELLANEOUS) ×2 IMPLANT
DRAIN PENROSE 0.25X18 (DRAIN) IMPLANT
DRSG ADAPTIC 3X8 NADH LF (GAUZE/BANDAGES/DRESSINGS) ×1 IMPLANT
GAUZE SPONGE 4X4 12PLY STRL (GAUZE/BANDAGES/DRESSINGS) IMPLANT
GAUZE XEROFORM 5X9 LF (GAUZE/BANDAGES/DRESSINGS) IMPLANT
GOWN STRL REUS W/ TWL XL LVL3 (GOWN DISPOSABLE) ×2 IMPLANT
GOWN STRL SURGICAL XL XLNG (GOWN DISPOSABLE) IMPLANT
KIT BASIN OR (CUSTOM PROCEDURE TRAY) ×1 IMPLANT
KIT TURNOVER KIT B (KITS) ×1 IMPLANT
NS IRRIG 1000ML POUR BTL (IV SOLUTION) ×1 IMPLANT
PACK ORTHO EXTREMITY (CUSTOM PROCEDURE TRAY) ×1 IMPLANT
PAD ARMBOARD POSITIONER FOAM (MISCELLANEOUS) ×2 IMPLANT
STOCKINETTE IMPERVIOUS LG (DRAPES) IMPLANT
SUT ETHILON 4 0 PS 2 18 (SUTURE) IMPLANT
SWAB COLLECTION DEVICE MRSA (MISCELLANEOUS) ×1 IMPLANT
SWAB CULTURE ESWAB REG 1ML (MISCELLANEOUS) IMPLANT
SYR BULB IRRIG 60ML STRL (SYRINGE) IMPLANT
TOWEL GREEN STERILE (TOWEL DISPOSABLE) ×1 IMPLANT
TUBE CONNECTING 12X1/4 (SUCTIONS) ×1 IMPLANT
YANKAUER SUCT BULB TIP NO VENT (SUCTIONS) ×1 IMPLANT

## 2023-10-30 NOTE — Progress Notes (Addendum)
   Kathryn Beck - 68 y.o. female MRN [id number]  Date of birth: [birth year]    Subjective:  Left hand swelling remains persistent with ongoing blistering along the dorsal aspect of the hand.    Objective:   VITALS:   Vitals:   10/30/23 0020 10/30/23 0150 10/30/23 0639 10/30/23 0706  BP: 134/79 126/65 123/65   Pulse: 91 88 90   Resp:  18 18   Temp:  98.3 F (36.8 C) 98.3 F (36.8 C)   TempSrc:  Oral Oral   SpO2: 99% 97% 100%   Weight:    76 kg  Height:    5' 5 (1.651 m)    Gen: Awake and alert Left upper extremity: - Persistent swelling over the dorsal aspect of the hand with associated blistering of the superficial skin, no palpable crepitus - Digital range of motion is preserved, pain with forced flexion of the digits along the dorsal aspect of the head, no significant erythema, no streaking up the forearm    Lab Results  Component Value Date   WBC 18.8 (H) 10/30/2023   HGB 9.7 (L) 10/30/2023   HCT 30.5 (L) 10/30/2023   MCV 76.8 (L) 10/30/2023   PLT 395 10/30/2023     Assessment/Plan:  68 year old female with persistent left upper hand/forearm swelling in the setting of recent ray resection of the long finger, consistent with likely cellulitis versus abscess  Plan for operative left hand I&D today  Risks and benefits of the procedure were discussed, risks including but not limited to infection, bleeding, scarring, stiffness, nerve injury, tendon injury, vascular injury, persistent infection, need for repeat washout versus potential further amputations, recurrence of symptoms and need for subsequent operation.  We also discussed the appropriate postoperative protocol and timeframe for return to activities and function.  Patient expressed understanding.     Amberli Ruegg, MD [place], [facility] 10/30/2023,

## 2023-10-30 NOTE — Transfer of Care (Signed)
 Immediate Anesthesia Transfer of Care Note  Patient: Kathryn Beck  Procedure(s) Performed: IRRIGATION AND DEBRIDEMENT WOUND (Left)  Patient Location: PACU  Anesthesia Type:General  Level of Consciousness: awake, alert , and oriented  Airway & Oxygen  Therapy: Patient Spontanous Breathing and Patient connected to face mask oxygen   Post-op Assessment: Report given to RN and Post -op Vital signs reviewed and stable  Post vital signs: Reviewed and stable  Last Vitals:  Vitals Value Taken Time  BP 117/75 10/30/23 08:45  Temp    Pulse 74 10/30/23 08:49  Resp 14 10/30/23 08:49  SpO2 100 % 10/30/23 08:49  Vitals shown include unfiled device data.  Last Pain:  Vitals:   10/30/23 0639  TempSrc: Oral  PainSc:          Complications: No notable events documented.

## 2023-10-30 NOTE — Anesthesia Procedure Notes (Signed)
 Procedure Name: LMA Insertion Date/Time: 10/30/2023 7:53 AM  Performed by: Mannie Krystal LABOR, CRNAPre-anesthesia Checklist: Patient identified, Emergency Drugs available, Suction available and Patient being monitored Patient Re-evaluated:Patient Re-evaluated prior to induction Oxygen  Delivery Method: Circle system utilized Preoxygenation: Pre-oxygenation with 100% oxygen  Induction Type: IV induction LMA: LMA inserted LMA Size: 4.0 Number of attempts: 1 Placement Confirmation: positive ETCO2 and breath sounds checked- equal and bilateral Tube secured with: Tape Dental Injury: Teeth and Oropharynx as per pre-operative assessment

## 2023-10-30 NOTE — Op Note (Signed)
 NAME: Kathryn Beck MEDICAL RECORD NO: [id number] DATE OF BIRTH: [birth year] FACILITY: [facility] LOCATION: [facility] OR PHYSICIAN: GILDARDO ALDERTON, MD   OPERATIVE REPORT   DATE OF PROCEDURE: 10/30/23    PREOPERATIVE DIAGNOSIS: Left hand persistent swelling and pain in the setting of recent long finger ray resection   POSTOPERATIVE DIAGNOSIS: Left hand persistent swelling and pain in setting of long finger ray resection, concern for underlying infection   PROCEDURE: Left hand and forearm irrigation and debridement, placement of drain with loose closure   SURGEON:  GILDARDO ALDERTON, M.D.   ASSISTANT: None   ANESTHESIA:  General   INTRAVENOUS FLUIDS:  Per anesthesia flow sheet.   ESTIMATED BLOOD LOSS:  Minimal.   COMPLICATIONS:  None.   SPECIMENS: Cultures taken   TOURNIQUET TIME:   * No tourniquets in log *   DISPOSITION:  Stable to PACU.   INDICATIONS:  68 year old female with persistent left upper extremity swelling in the setting of recent ray resection of the long finger, consistent with likely cellulitis versus abscess.  Patient was indicated for left hand and forearm irrigation and debridement.  Risks and benefits of surgery were discussed including the risks of infection, bleeding, scarring, stiffness, nerve injury, vascular injury, tendon injury, need for subsequent operation, persistent infection, need for subsequent washout and potential amputation.  She voiced understanding of these risks and elected to proceed.  OPERATIVE COURSE: Patient was seen and identified in the preoperative area and marked appropriately.  Surgical consent had been signed. Antibiotics were held for intraoperative cultures. She was transferred to the operating room and placed in supine position with the Left upper extremity on an arm board.  General anesthesia was induced by the anesthesiologist.  Left upper extremity was prepped and draped in normal sterile orthopedic fashion.  A surgical pause was  performed between the surgeons, anesthesia, and operating room staff and all were in agreement as to the patient, procedure, and site of procedure.  Tourniquet was was not utilized for this case.  Prior sutures from the dorsal aspect of the left hand at the webspace between the index and ring finger were removed.  Wound was bluntly opened, there was notable purulence encountered.  Cultures were taken.  Copious irrigation was performed of this site.  Incision was extended both dorsally and volarly along the hand in longitudinal fashion.  This was done to expose the underlying potential space left from the previous ray resection.  Murky fluid was encountered in this region, and was expressed from multiple planes of the dorsal aspect of the hand.  Incision was carried down over the dorsal aspect of the hand in line with the long finger previous ray resection in order to decompress all of the fluid pocket.  Counterincision was then made in the dorsal aspect of the forearm just proximal to the wrist crease.  Incision was carried down utilizing a 15 blade, blunt dissection was performed down to the level of the extensor retinaculum.  A Freer elevator was then inserted from the proximal incision to communicate to the distal incision to allow for further decompression of the drainage.  Minimal drainage was encountered and expressible at the forearm region.  Penrose drain was then inserted to allow ongoing drainage between the proximal and distal incisions along the dorsal aspect of the hand and wrist.  Copious irrigation was once again performed throughout the dorsal and volar aspect of the hand as well as the forearm.  There was no persistent murky fluid at this juncture.  Minimal bleeding was encountered throughout the wounds despite the use of no tourniquet indicating poor vascularity.  Loose closure was then performed of the wounds utilizing 4-0 nylon in simple standard fashion.  Sterile dressings were applied  utilizing Xeroform, 4 x 4's, Kerlix and Ace wrap.  Digits were left free to allow for mobilization.  The operative drapes were broken down.  The patient was awoken from anesthesia safely and taken to PACU in stable condition.   Post-operative plan: The patient will recover in the post-anesthesia care unit and then be readmitted to the floor.  The patient will be non weight bearing on the left upper extremity in a soft dressing.  Patient will be followed while in house.  Instructions for daily dressing changes will be provided for nursing staff.  Continue with empiric antibiotic coverage, cultures will be followed.   Tracen Mahler, MD Electronically signed, 10/30/23

## 2023-10-30 NOTE — Consult Note (Signed)
 [facility] Renal Consultation Note    Indication for Consultation:  Management of ESRD/hemodialysis; anemia, hypertension/volume and secondary hyperparathyroidism  ERE:Dpfednw, Rollene BRAVO, MD  HPI: Kathryn Beck is a 68 y.o. female with ESRD on HD MWF at [facility]. She has a past medical history significant for newly diagnosed atrial fib (on apixaban ), PAD  (s/p R BKA in 05/2022, prior lithotripsy and stenting of the left superficial femoral and popliteal artery in 06/2022 and ultimately L BKA in 09/2022), HTN, HLD, T2DM, bilateral adrenal adenoma (followed by Endocrinology), chronic HFpEF, aortic stenosis, OSA, history of TIA's, and history of recurrent chest pain  (cath in 12/2018 showing normal coronary arteries).  Reviewed medical records.  Patient was recently hospitalized at [facility] from 10/27/23-10/28/23 for new onset A-fib.  Patient underwent a cardioversion and was discharged on Eliquis  and ASA.  It was noted during this hospitalization of left hand swelling.  Imaging was negative for fluid collection and she was discharged on p.o. antibiotics.  Patient then went to her scheduled outpatient dialysis on 10/29/23 and was complaining of worsening left hand pain with a noted blister; thus, she was sent to [facility] ED for further evaluation. Discussed with HD RN at her outpatient clinic today.  Patient received dialysis for 1-1/2 hours and 1.4L was removed yesterday.  It appears patient then was transferred to [facility] for ongoing care.  Of note, patient underwent an amputation of left middle finger by Dr. Harden on 09/29/2023.  Left hand x-ray was performed which showed diffuse edema and dorsum with some foci of air in the soft tissues.  CT of left hand showed concern for soft tissue infection.  Today, patient underwent left hand I&D with drain placement by Ortho.  Seen and examined patient at bedside.  She appears altered, however, she reports recently receiving pain  medication and likely still has some anesthesia in her system.  She is on RA and denies SOB, CP, palpitations, abdominal pain, and N/V.  Recent renal labs are stable and she is euvolemic on exam.  CXR from 8/6 showed minimal L basilar subsegmental atelectasis. Urgent dialysis is not indicated for today.  Plan for next dialysis on 11/01/2023 if patient remains inpatient.   Past Medical History:  Diagnosis Date   Acid reflux    Amputated left leg (HCC)    bilateral BKA   Anemia    Arthritis    Axillary masses    Soft tissue - status post excision   Back pain    CHF (congestive heart failure) (HCC)    COVID-19 virus infection 04/06/2019   Depression    End-stage renal disease (HCC)    M/W/F dialysis   Essential hypertension    Headache    years ago   History of blood transfusion    History of cardiac catheterization    Normal coronary arteries October 2020   History of claustrophobia    History of pneumonia 2019   Hypoxia 04/03/2019   Memory loss    Mixed hyperlipidemia    Obesity    Pancreatitis    Peritoneal dialysis catheter in place [facility])    Pneumonia due to COVID-19 virus 04/02/2019   Sleep apnea    Noncompliant with CPAP   Stroke (HCC)    mini stroke   Type 2 diabetes mellitus (HCC)    Past Surgical History:  Procedure Laterality Date   ABDOMINAL AORTOGRAM W/LOWER EXTREMITY N/A 04/30/2022   Procedure: ABDOMINAL AORTOGRAM W/LOWER EXTREMITY;  Surgeon: Magda,  Debby SAILOR, MD;  Location: [facility] INVASIVE CV LAB;  Service: Cardiovascular;  Laterality: N/A;   ABDOMINAL AORTOGRAM W/LOWER EXTREMITY N/A 07/21/2022   Procedure: ABDOMINAL AORTOGRAM W/LOWER EXTREMITY;  Surgeon: Serene Gaile ORN, MD;  Location: [facility] INVASIVE CV LAB;  Service: Cardiovascular;  Laterality: N/A;   ABDOMINAL HYSTERECTOMY     ACHILLES TENDON LENGTHENING  08/15/2022   Procedure: ACHILLES TENDON LENGTHENING;  Surgeon: Silva Juliene SAUNDERS, DPM;  Location: [facility] OR;  Service: Podiatry;;   AMPUTATION Right 05/29/2022    Procedure: RIGHT BELOW THE KNEE AMPUTATION;  Surgeon: Harden Jerona GAILS, MD;  Location: [facility] OR;  Service: Orthopedics;  Laterality: Right;   AMPUTATION Left 09/04/2022   Procedure: AMPUTATION FOOT, serial irrigation;  Surgeon: Joya Stabs, DPM;  Location: [facility] OR;  Service: Podiatry;  Laterality: Left;  Surgical team to do block   AMPUTATION Left 10/07/2022   Procedure: LEFT BELOW KNEE AMPUTATION;  Surgeon: Harden Jerona GAILS, MD;  Location: [facility] OR;  Service: Orthopedics;  Laterality: Left;   AMPUTATION FINGER Left 09/29/2023   Procedure: AMPUTATION, FINGER;  Surgeon: Harden Jerona GAILS, MD;  Location: [facility] OR;  Service: Orthopedics;  Laterality: Left;  LEFT HAND LONG FINGER RAY AMPUTATION   AV FISTULA PLACEMENT Left 09/02/2017   Procedure: creation of left arm ARTERIOVENOUS (AV) FISTULA;  Surgeon: Serene Gaile ORN, MD;  Location: [facility] OR;  Service: Vascular;  Laterality: Left;   COLONOSCOPY  2008   Dr. Harvey: normal    COLONOSCOPY N/A 12/18/2016   Dr. Harvey: multiple tubular adenomas, internal hemorrhoids. Surveillance in 3 years    ESOPHAGEAL DILATION N/A 10/13/2015   Procedure: ESOPHAGEAL DILATION;  Surgeon: Claudis RAYMOND Rivet, MD;  Location: [facility] ENDO [facility];  Service: Endoscopy;  Laterality: N/A;   ESOPHAGOGASTRODUODENOSCOPY N/A 10/13/2015   Dr. Rivet: chronic gastritis on path, no H.pylori. Empiric dilation    ESOPHAGOGASTRODUODENOSCOPY N/A 12/18/2016   Dr. Harvey: mild gastritis. BRAVO study revealed uncontrolled GERD. Dysphagia secondary to uncontrolled reflux   FOOT SURGERY Bilateral    nerve     LEFT HEART CATH AND CORONARY ANGIOGRAPHY N/A 12/29/2018   Procedure: LEFT HEART CATH AND CORONARY ANGIOGRAPHY;  Surgeon: Dann Candyce RAMAN, MD;  Location: [facility] INVASIVE CV LAB;  Service: Cardiovascular;  Laterality: N/A;   LOWER EXTREMITY ANGIOGRAPHY Right 05/04/2022   Procedure: Lower Extremity Angiography;  Surgeon: Lanis Fonda BRAVO, MD;  Location: [facility] INVASIVE CV LAB;  Service: Cardiovascular;  Laterality: Right;    LUNG BIOPSY     MASS EXCISION Right 01/09/2013   Procedure: EXCISION OF NEOPLASM OF RIGHT  AXILLA  AND EXCISION OF NEOPLASM OF LEFT AXILLA;  Surgeon: Oneil DELENA Budge, MD;  Location: [facility] ORS;  Service: General;  Laterality: Right;  procedure end @ 08:23   MYRINGOTOMY WITH TUBE PLACEMENT Bilateral 04/28/2017   Procedure: BILATERAL MYRINGOTOMY WITH TUBE PLACEMENT;  Surgeon: Karis Clunes, MD;  Location: [facility] OR;  Service: ENT;  Laterality: Bilateral;   PERIPHERAL VASCULAR BALLOON ANGIOPLASTY Right 05/04/2022   Procedure: PERIPHERAL VASCULAR BALLOON ANGIOPLASTY;  Surgeon: Lanis Fonda BRAVO, MD;  Location: [facility] INVASIVE CV LAB;  Service: Cardiovascular;  Laterality: Right;  PT   PERIPHERAL VASCULAR INTERVENTION Right 05/04/2022   Procedure: PERIPHERAL VASCULAR INTERVENTION;  Surgeon: Lanis Fonda BRAVO, MD;  Location: [facility] INVASIVE CV LAB;  Service: Cardiovascular;  Laterality: Right;  SFA   PERIPHERAL VASCULAR INTERVENTION Left 07/21/2022   Procedure: PERIPHERAL VASCULAR INTERVENTION;  Surgeon: Serene Gaile ORN, MD;  [facility]: [facility];  Service: Cardiovascular;  Laterality: Left;   REVISION OF  ARTERIOVENOUS GORETEX GRAFT Left 05/04/2018   Procedure: TRANSPOSITION OF CEPHALIC VEIN ARTERIOVENOUS FISTULA LEFT ARM;  Surgeon: Oris Krystal FALCON, MD;  Location: [facility] OR;  Service: Vascular;  Laterality: Left;   SAVORY DILATION N/A 12/18/2016   Procedure: SAVORY DILATION;  Surgeon: Harvey Margo CROME, MD;  Location: [facility];  Service: Endoscopy;  Laterality: N/A;   TRANSMETATARSAL AMPUTATION Left 08/15/2022   Procedure: TRANSMETATARSAL AMPUTATION;  Surgeon: Silva Juliene SAUNDERS, DPM;  Location: [facility] OR;  Service: Podiatry;  Laterality: Left;   Family History  Problem Relation Age of Onset   Hypertension Father    Hypercholesterolemia Father    Arthritis Father    Hypertension Sister    Hypercholesterolemia Sister    Breast cancer Sister    Hypertension Sister    Colon cancer Neg Hx    Colon polyps Neg Hx    Social  History:  reports that she has never smoked. She has never been exposed to tobacco smoke. She has never used smokeless tobacco. She reports that she does not drink alcohol and does not use drugs. Allergies  Allergen Reactions   Ace Inhibitors Anaphylaxis and Swelling   Penicillins Itching and Swelling    Has tolerated cefazolin  on multiple occasions    Statins Other (See Comments)    elevated LFT's     Albuterol  Swelling   Prior to Admission medications   Medication Sig Start Date End Date Taking? Authorizing Provider  acetaminophen  (TYLENOL ) 650 MG CR tablet Take 1,300 mg by mouth every 8 (eight) hours as needed for pain.   Yes [provider]  amLODipine  (NORVASC ) 10 MG tablet Take 0.5 tablets (5 mg total) by mouth daily. 10/28/23  Yes Pearlean Manus, MD  aspirin  81 MG chewable tablet Chew 2 tablets (162 mg total) by mouth daily. Patient taking differently: Chew 81 mg by mouth daily. 09/16/23  Yes Bevely Doffing, FNP  B Complex-C-Folic Acid  (RENA-VITE RX) 1 MG TABS Take 1 tablet by mouth daily. 04/15/22  Yes [provider]  busPIRone  (BUSPAR ) 5 MG tablet TAKE ONE TABLET BY MOUTH TWICE DAILY 10/20/23  Yes Antonetta Rollene BRAVO, MD  cefdinir  (OMNICEF ) 300 MG capsule Take 1 capsule (300 mg total) by mouth every evening for 7 days. For possible left hand infection 10/28/23 11/04/23 Yes Emokpae, Courage, MD  cinacalcet  (SENSIPAR ) 30 MG tablet Take 3 tablets (90 mg total) by mouth 3 (three) times a week. Patient taking differently: Take 90 mg by mouth every Monday, Wednesday, and Friday. 09/09/22  Yes Sheikh, Omair Latif, DO  DULoxetine  (CYMBALTA ) 60 MG capsule TAKE (1) CAPSULE BY MOUTH ONCE DAILY. 04/05/23  Yes Antonetta Rollene BRAVO, MD  ezetimibe  (ZETIA ) 10 MG tablet Take 1 tablet (10 mg total) by mouth daily. 04/27/23  Yes Antonetta Rollene BRAVO, MD  insulin  glargine, 2 Unit Dial , (TOUJEO  MAX SOLOSTAR) 300 UNIT/ML Solostar Pen Inject 30 Units into the skin daily. May need to slowly  increase the dose depending upon your blood sugar, follow-up with PCP 01/01/23  Yes Trixie File, MD  metoprolol  succinate (TOPROL -XL) 50 MG 24 hr tablet TAKE (1) TABLET BY MOUTH DAILY WITH FOOD *TAKE AFTER DIALYSIS* Patient taking differently: Take 50 mg by mouth Every Tuesday,Thursday,and Saturday with dialysis. Take 50mg  (1 tablets) by mouth on non-dialysis days. (Sunday, Tuesday, Thursday, Saturday). 01/05/23  Yes Antonetta Rollene BRAVO, MD  oxyCODONE -acetaminophen  (PERCOCET/ROXICET) 5-325 MG tablet Take 1 tablet by mouth every 6 (six) hours as needed. 09/29/23  Yes Gerome Maurilio HERO, PA-C  sertraline  (ZOLOFT ) 50 MG  tablet Take 50 mg by mouth daily. 10/06/23  Yes [provider]  torsemide  (DEMADEX ) 100 MG tablet Take 1 tablet (100 mg total) by mouth every Tuesday, Thursday, Saturday, and Sunday. 10/28/23  Yes Pearlean Manus, MD  [name]  500 MG chewable tablet Chew 1-2 tablets (500-1,000 mg total) by mouth See admin instructions. Take 1000mg  (2 tablets) by mouth with meals and 500mg  (1 tablet) with snacks 08/23/23  Yes Antonetta Rollene BRAVO, MD  apixaban  (ELIQUIS ) 5 MG TABS tablet Take 1 tablet (5 mg total) by mouth 2 (two) times daily. For Afib Patient not taking: Reported on 10/29/2023 10/29/23   Pearlean Manus, MD  doxycycline  (VIBRA -TABS) 100 MG tablet Take 1 tablet (100 mg total) by mouth 2 (two) times daily for 7 days. For possible left hand infection Patient not taking: Reported on 10/29/2023 10/28/23 11/04/23  Pearlean Manus, MD  FLUoxetine (PROZAC) 10 MG capsule Take 10 mg by mouth daily.    05/28/11  [provider]  glipiZIDE  (GLUCOTROL ) 10 MG tablet Take 10 mg by mouth 2 (two) times daily before a meal.    05/28/11  [provider]   Current Facility-Administered Medications  Medication Dose Route Frequency Provider Last Rate Last Admin   acetaminophen  (TYLENOL ) tablet 1,000 mg  1,000 mg Oral Q8H Tat, David, MD   1,000 mg at 10/29/23 2354   aspirin  chewable tablet 81 mg   81 mg Oral Daily Tat, Alm, MD   81 mg at 10/30/23 1134   busPIRone  (BUSPAR ) tablet 5 mg  5 mg Oral BID Tat, Alm, MD   5 mg at 10/30/23 1134   ceFEPIme  (MAXIPIME ) 1 g in sodium chloride  0.9 % 100 mL IVPB  1 g Intravenous Q24H Evonnie Alm, MD       NOREEN ON 11/01/2023] cinacalcet  (SENSIPAR ) tablet 90 mg  90 mg Oral Q M,W,F-HD Tat, Alm, MD       DULoxetine  (CYMBALTA ) DR capsule 60 mg  60 mg Oral Daily Tat, David, MD   60 mg at 10/30/23 1134   ezetimibe  (ZETIA ) tablet 10 mg  10 mg Oral Daily Tat, David, MD   10 mg at 10/30/23 1133   fentaNYL  (SUBLIMAZE ) injection 25 mcg  25 mcg Intravenous Q2H PRN Tat, Alm, MD   25 mcg at 10/30/23 1133   insulin  aspart (novoLOG ) injection 0-5 Units  0-5 Units Subcutaneous QHS Tat, Alm, MD       insulin  aspart (novoLOG ) injection 0-9 Units  0-9 Units Subcutaneous TID WC Tat, David, MD       metoprolol  succinate (TOPROL -XL) 24 hr tablet 50 mg  50 mg Oral Q T,Th,Sa-HD Evonnie Alm, MD   50 mg at 10/30/23 1133   multivitamin (RENA-VIT) tablet 1 tablet  1 tablet Oral Daily Tat, David, MD   1 tablet at 10/30/23 1134   mupirocin  ointment (BACTROBAN ) 2 % 1 Application  1 Application Nasal BID Tat, Alm, MD   1 Application at 10/30/23 1133   ondansetron  (ZOFRAN ) tablet 4 mg  4 mg Oral Q6H PRN Tat, Alm, MD       Or   ondansetron  (ZOFRAN ) injection 4 mg  4 mg Intravenous Q6H PRN Tat, Alm, MD       sertraline  (ZOLOFT ) tablet 50 mg  50 mg Oral Daily Tat, David, MD   50 mg at 10/30/23 1133   sucroferric oxyhydroxide ([name] ) chewable tablet 1,000 mg  1,000 mg Oral TID WC Evonnie Alm, MD   1,000 mg at 10/30/23 1133   torsemide  (DEMADEX )  tablet 100 mg  100 mg Oral Q T,Th,S,Su Evonnie Lenis, MD   100 mg at 10/30/23 1134   [START ON 11/01/2023] vancomycin  (VANCOREADY) IVPB 750 mg/150 mL  750 mg Intravenous Q M,W,F-HD Tat, Lenis, MD       Facility-Administered Medications Ordered in Other Encounters  Medication Dose Route Frequency Provider Last Rate Last Admin   0.9 %   sodium chloride  infusion   Intravenous Continuous Lockamy, Randi L, NP-C 0 mL/hr at 10/07/22 1422 New Bag at 09/29/23 0813   Labs: Basic Metabolic Panel: Recent Labs  Lab 10/27/23 1108 10/29/23 1029 10/30/23 0432  NA 138 140 142  K 4.0 4.1 4.4  CL 95* 98 105  CO2 24 25 22   GLUCOSE 105* 95 95  BUN 26* 34* 43*  CREATININE 3.53* 3.99* 4.89*  CALCIUM  8.9 8.4* 8.6*  PHOS  --   --  5.3*   Liver Function Tests: Recent Labs  Lab 10/27/23 1108 10/29/23 1029 10/30/23 0432  AST 45* 22  --   ALT 44 33  --   ALKPHOS 130* 136*  --   BILITOT 1.2 0.6  --   PROT 7.1 6.8  --   ALBUMIN  3.2* 2.7* 2.2*   No results for input(s): LIPASE, AMYLASE in the last 168 hours. No results for input(s): AMMONIA in the last 168 hours. CBC: Recent Labs  Lab 10/27/23 1108 10/28/23 0444 10/29/23 1029 10/30/23 0432  WBC 16.9* 15.9* 19.3* 18.8*  NEUTROABS 14.1*  --  16.5*  --   HGB 11.1* 9.8* 9.9* 9.7*  HCT 35.6* 32.0* 32.8* 30.5*  MCV 79.5* 80.0 78.3* 76.8*  PLT 329 311 381 395   Cardiac Enzymes: No results for input(s): CKTOTAL, CKMB, CKMBINDEX, TROPONINI in the last 168 hours. CBG: Recent Labs  Lab 10/29/23 2134 10/30/23 0634 10/30/23 0706 10/30/23 0848 10/30/23 1219  GLUCAP 116* 96 90 81 176*   Iron Studies: No results for input(s): IRON, TIBC, TRANSFERRIN, FERRITIN in the last 72 hours. Studies/Results: CT Hand Left Wo Contrast Addendum Date: 10/29/2023 ADDENDUM REPORT: 10/29/2023 12:32 ADDENDUM: These results were called by telephone at the time of interpretation on 10/29/2023 at 12:08 pm to provider JOSEPH ZAMMIT , who verbally acknowledged these results. Electronically Signed   By: Harrietta Sherry M.D.   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 Blisters on the posterior left hand. Pain. EXAM: CT OF THE LEFT HAND WITHOUT CONTRAST TECHNIQUE: Multidetector CT imaging of the left hand was  performed according to the standard protocol. Multiplanar CT image reconstructions were also generated. RADIATION DOSE REDUCTION: This exam was performed according to the departmental dose-optimization program which includes automated exposure control, adjustment of the mA and/or kV according to patient size and/or use of iterative reconstruction technique. COMPARISON:  Left hand radiograph dated 11/10/2023. FINDINGS: Bones/Joint/Cartilage No acute fracture or dislocation. Status post amputation of the third proximal, middle, and distal phalanges. No evidence of acute osteolysis or erosive changes. Mild degenerative arthropathy throughout the hand. Ligaments Ligaments are suboptimally evaluated by CT. Soft tissue / Muscles and Tendons Blistering along the dorsal hand. Marked subcutaneous edema along the dorsal hand extending to the underlying extensor digitorum tendons with numerous scattered foci of soft tissue gas. Surrounding fat stranding. No discrete loculated fluid collection. Peripheral vascular calcifications. IMPRESSION: 1. Blistering and marked subcutaneous edema with stranding along the dorsal hand extending to the underlying extensor digitorum tendons with numerous scattered foci of soft tissue gas. These  findings are concerning for gas-forming soft tissue infection. No discrete loculated fluid collection. 2. No acute osseous abnormality. 3. Status post amputation of the third digit. Electronically Signed: By: Harrietta Sherry M.D. On: 10/29/2023 12:02   DG Hand Complete Left Result Date: 10/29/2023 CLINICAL DATA:  Pain. EXAM: LEFT HAND - COMPLETE 3+ VIEW COMPARISON:  None Available. FINDINGS: There is amputation of the middle finger at the level of metacarpophalangeal joint. No acute fracture or dislocation. No aggressive osseous lesion. 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 No periarticular  osteopenia or bony erosions. No radiopaque foreign bodies. There is diffuse soft tissue swelling over the dorsum of the hand. There are few foci of air within the soft tissue. However, underlying bones appear intact. No focal bone erosions. IMPRESSION: 1. No acute osseous abnormality of the left hand. 2. Amputation of middle finger at the level of metacarpophalangeal joint. 3. There is diffuse soft tissue swelling over the dorsum of the hand. There are few foci of air within the soft tissue. However, underlying bones appear intact. No focal bone erosions. Electronically Signed   By: Ree Molt M.D.   On: 10/29/2023 10:09    ROS: All others negative except those listed in HPI.   Physical Exam: Vitals:   10/30/23 0930 10/30/23 0939 10/30/23 1133 10/30/23 1222  BP: 128/62 132/63 (!) 165/69 (!) 150/67  Pulse: 76 82 89 89  Resp: 18 20  18   Temp: 98.2 F (36.8 C) 98.2 F (36.8 C)  98.1 F (36.7 C)  TempSrc:  Oral  Oral  SpO2: 99% 99%  99%  Weight:      Height:         General: Appears drowsy but responds to voice Head: Sclera not icteric  Lungs: Clear anteriorly and laterally. No wheeze, rales or rhonchi. Breathing is unlabored. Heart: RRR. No murmur, rubs or gallops.  Abdomen: soft and nontender Extremities: L hand with ace wrap; B/l BKAs-no edema b/l stumps Neuro: Oriented to herself at this time (recently received pain medication per patient)  Dialysis Orders:  [facility] - MWF (Requested records to be faxed over today)   Last Labs: Hgb 9.7, K 4.4, Ca 8.6, P 5.3, Alb 2.2  Assessment/Plan: Left hand pain with swelling - S/p L hand I&D with drain placement, per ortho ESRD -  on HD MWF. No urgent need for HD today. Plan for next HD 8/11 if she remains inpatient Hypertension/volume  - Bps acceptable. Awaiting outpatient HD records to review medications. Anemia of CKD - Hgb 9.7. Awaiting outpatient HD records Secondary Hyperparathyroidism -  Check phos in AM Nutrition -  Renal diet with fluid restriction  Charmaine Piety, NP [facility] 10/30/2023, 12:47 PM

## 2023-10-30 NOTE — Anesthesia Preprocedure Evaluation (Signed)
 Anesthesia Evaluation  Patient identified by MRN, date of birth, ID band Patient awake    Reviewed: Allergy & Precautions, H&P , NPO status , Patient's Chart, lab work & pertinent test results  History of Anesthesia Complications Negative for: history of anesthetic complications  Airway Mallampati: III  TM Distance: >3 FB Neck ROM: Full    Dental no notable dental hx. (+) Teeth Intact, Dental Advisory Given   Pulmonary sleep apnea , pneumonia   Pulmonary exam normal breath sounds clear to auscultation       Cardiovascular Exercise Tolerance: Good hypertension, + Peripheral Vascular Disease and +CHF  Normal cardiovascular exam+ Valvular Problems/Murmurs AS and MR  Rhythm:Regular Rate:Normal  IMPRESSIONS     1. Left ventricular ejection fraction, by estimation, is 55%. The left  ventricle has normal function. The left ventricle has no regional wall  motion abnormalities. There is mild left ventricular hypertrophy. Left  ventricular diastolic parameters are  consistent with Grade I diastolic dysfunction (impaired relaxation).  Elevated left ventricular end-diastolic pressure.   2. Right ventricular systolic function is normal. The right ventricular  size is normal. Tricuspid regurgitation signal is inadequate for assessing  PA pressure.   3. Left atrial size was severely dilated.   4. The mitral valve is abnormal. Trivial mitral valve regurgitation. Mild  mitral stenosis. Severe mitral annular calcification.   5. The aortic valve has an indeterminant number of cusps. Aortic valve  regurgitation is not visualized. Moderate aortic valve stenosis. Aortic  valve area, by VTI measures 1.14 cm. Aortic valve mean gradient measures  21.0 mmHg. Aortic valve Vmax  measures 3.04 m/s.   6. The inferior vena cava is normal in size with greater than 50%  respiratory variability, suggesting right atrial pressure of 3 mmHg.       Neuro/Psych  Headaches PSYCHIATRIC DISORDERS Anxiety Depression     Neuromuscular disease CVA  negative psych ROS   GI/Hepatic Neg liver ROS,GERD  ,,  Endo/Other  diabetes, Type 2    Renal/GU Renal disease  negative genitourinary   Musculoskeletal  (+) Arthritis ,    Abdominal   Peds negative pediatric ROS (+)  Hematology  (+) Blood dyscrasia, anemia   Anesthesia Other Findings   Reproductive/Obstetrics negative OB ROS                              Anesthesia Physical Anesthesia Plan  ASA: 3  Anesthesia Plan: General   Post-op Pain Management: Tylenol  PO (pre-op )*   Induction: Intravenous  PONV Risk Score and Plan: 2 and Ondansetron , Treatment may vary due to age or medical condition and Dexamethasone   Airway Management Planned: LMA  Additional Equipment: None  Intra-op Plan:   Post-operative Plan: Extubation in OR  Informed Consent: I have reviewed the patients History and Physical, chart, labs and discussed the procedure including the risks, benefits and alternatives for the proposed anesthesia with the patient or authorized representative who has indicated his/her understanding and acceptance.       Plan Discussed with: Anesthesiologist and CRNA  Anesthesia Plan Comments: (68 year old female follows with cardiology for history of HTN, moderate aortic stenosis (mean gradient 21 mmHg by echo 05/2023), mild mitral stenosis, HFpEF, PAD s/p bilateral BKA, OSA on BiPAP, prior CVA, HLD.  Minimal CAD by cath 2020. last seen by Dr. Raford on 06/22/2023.  Blood pressure noted to be labile, particularly on dialysis days.  Current medications include amlodipine , hydralazine , and metoprolol , with holding  on dialysis days.  Other pertinent history includes ESRD on HD MWF via LUE AVF, IDDM 2 (A1c 8.4 on 04/20/23).  EKG 04/30/2023: Sinus rhythm. Rate 86. Atrial premature complex. Right axis deviation  TTE 06/15/2023:  1. Left ventricular ejection  fraction, by estimation, is 55%. The left  ventricle has normal function. The left ventricle has no regional wall  motion abnormalities. There is mild left ventricular hypertrophy. Left  ventricular diastolic parameters are  consistent with Grade I diastolic dysfunction (impaired relaxation).  Elevated left ventricular end-diastolic pressure.   2. Right ventricular systolic function is normal. The right ventricular  size is normal. Tricuspid regurgitation signal is inadequate for assessing  PA pressure.   3. Left atrial size was severely dilated.   4. The mitral valve is abnormal. Trivial mitral valve regurgitation. Mild  mitral stenosis. Severe mitral annular calcification.   5. The aortic valve has an indeterminant number of cusps. Aortic valve  regurgitation is not visualized. Moderate aortic valve stenosis. Aortic  valve area, by VTI measures 1.14 cm. Aortic valve mean gradient measures  21.0 mmHg. Aortic valve Vmax  measures 3.04 m/s.   6. The inferior vena cava is normal in size with greater than 50%  respiratory variability, suggesting right atrial pressure of 3 mmHg.   Comparison(s): Changes from prior study are noted. AS has progressed from  mild to moderate.   Cath 12/29/2018:  Large, widely patent coronary arteries.  LV end diastolic pressure is mildly elevated.  There is no aortic valve stenosis.   No significant CAD.  Continue medical therapy.   )         Anesthesia Quick Evaluation

## 2023-10-30 NOTE — Progress Notes (Addendum)
 PROGRESS NOTE    Kathryn Beck  FMW:[id number] DOB: [birth year] DOA: 10/29/2023 PCP: Antonetta Rollene BRAVO, MD  Chief Complaint  Patient presents with   Blister    Brief Narrative:   Kathryn Beck is Kathryn Beck 68 year old female with Peregrine Nolt history of newly diagnosed atrial fib on apixaban , PAD  (s/p R BKA in 05/2022, prior lithotripsy and stenting of the left superficial femoral and popliteal artery in 06/2022 and ultimately L BKA in 09/2022), HTN, HLD, Type 2 DM, bilateral adrenal adenoma (followed by Endocrinology), chronic HFpEF, aortic stenosis, OSA, ESRD (MWF), history of TIA's, history of recurrent chest pain  (cath in 12/2018 showing normal coronary arteries) presenting with left hand pain and blisters.   Assessment & Plan:   Principal Problem:   Abscess of left hand Active Problems:   Left hand pain  Skin Soft Tissue Infection - CT with blistering and marked subcutaneous edema with stranding along the dorsal hand extending to the underlying extensor digitorum tendons with numerous scattered foci of soft tissue gas - continue abx with vanc, cefepime , flagyl  - Follow-up blood cultures  - now s/p L hand and forearm I&D, placement of drain 8/9 - follow surgical culture  - Judicious opioids   Paroxysmal atrial fibrillation - Currently in sinus rhythm - Continue metoprolol  - will resume eliquis  when ok per orthopedics (8/10 per discussion with ortho)   ESRD - volume per renal  - on T/Th/S/Su torsemide    Essential hypertension - Continue metoprolol  succinate and amlodipine    Anxiety/depression - Cymbalta , sertraline , buspar    Diabetes mellitus type 2 with hyperglycemia - NovoLog  sliding scale - Reduced dose of semaglutide  - 10/27/2023 hemoglobin A1c 6.7   PAD - Status post bilateral BKA - s/p Stent, left superficial femoral and popliteal artery 07/21/22 ([facility]) - Continue aspirin  - Holding apixaban  temporarily, resume when ok with surgery    DVT prophylaxis: heparin  Code  Status: full Family Communication: none Disposition:   Status is: Inpatient Remains inpatient appropriate because: need for continued inpatient care   Consultants:  Renal orthopedics  Procedures:   8/9 Left hand and forearm irrigation and debridement, placement of drain with loose closure  Antimicrobials:  Anti-infectives (From admission, onward)    Start     Dose/Rate Route Frequency Ordered Stop   11/01/23 1600  vancomycin  (VANCOREADY) IVPB 750 mg/150 mL        750 mg 150 mL/hr over 60 Minutes Intravenous Every M-W-F (Hemodialysis) 10/29/23 1337     10/30/23 1200  ceFEPIme  (MAXIPIME ) 1 g in sodium chloride  0.9 % 100 mL IVPB        1 g 200 mL/hr over 30 Minutes Intravenous Every 24 hours 10/29/23 1337     10/29/23 1130  vancomycin  (VANCOREADY) IVPB 1500 mg/300 mL        1,500 mg 150 mL/hr over 120 Minutes Intravenous  Once 10/29/23 1110 10/29/23 1336   10/29/23 1130  ceFEPIme  (MAXIPIME ) 2 g in sodium chloride  0.9 % 100 mL IVPB        2 g 200 mL/hr over 30 Minutes Intravenous  Once 10/29/23 1117 10/29/23 1310       Subjective: No new complaints   Objective: Vitals:   10/30/23 0930 10/30/23 0939 10/30/23 1133 10/30/23 1222  BP: 128/62 132/63 (!) 165/69 (!) 150/67  Pulse: 76 82 89 89  Resp: 18 20  18   Temp: 98.2 F (36.8 C) 98.2 F (36.8 C)  98.1 F (36.7 C)  TempSrc:  Oral  Oral  SpO2: 99% 99%  99%  Weight:      Height:       No intake or output data in the 24 hours ending 10/30/23 1352 Filed Weights   10/29/23 0901 10/30/23 0706  Weight: 76 kg 76 kg    Examination:  General exam: Appears calm and comfortable  Respiratory system: unlabored Cardiovascular system: RRR Central nervous system: Alert and oriented. No focal neurological deficits. Extremities: L arm in splint     Data Reviewed: I have personally reviewed following labs and imaging studies  CBC: Recent Labs  Lab 10/27/23 1108 10/28/23 0444 10/29/23 1029 10/30/23 0432  WBC 16.9*  15.9* 19.3* 18.8*  NEUTROABS 14.1*  --  16.5*  --   HGB 11.1* 9.8* 9.9* 9.7*  HCT 35.6* 32.0* 32.8* 30.5*  MCV 79.5* 80.0 78.3* 76.8*  PLT 329 311 381 395    Basic Metabolic Panel: Recent Labs  Lab 10/27/23 1108 10/29/23 1029 10/30/23 0432  NA 138 140 142  K 4.0 4.1 4.4  CL 95* 98 105  CO2 24 25 22   GLUCOSE 105* 95 95  BUN 26* 34* 43*  CREATININE 3.53* 3.99* 4.89*  CALCIUM  8.9 8.4* 8.6*  MG 2.0  --   --   PHOS  --   --  5.3*    GFR: Estimated Creatinine Clearance: 11.4 mL/min (Kahli Fitzgerald) (by C-G formula based on SCr of 4.89 mg/dL (H)).  Liver Function Tests: Recent Labs  Lab 10/27/23 1108 10/29/23 1029 10/30/23 0432  AST 45* 22  --   ALT 44 33  --   ALKPHOS 130* 136*  --   BILITOT 1.2 0.6  --   PROT 7.1 6.8  --   ALBUMIN  3.2* 2.7* 2.2*    CBG: Recent Labs  Lab 10/29/23 2134 10/30/23 0634 10/30/23 0706 10/30/23 0848 10/30/23 1219  GLUCAP 116* 96 90 81 176*     Recent Results (from the past 240 hours)  MRSA Next Gen by PCR, Nasal     Status: None   Collection Time: 10/27/23  1:43 PM   Specimen: Nasal Mucosa; Nasal Swab  Result Value Ref Range Status   MRSA by PCR Next Gen NOT DETECTED NOT DETECTED Final    Comment: (NOTE) The GeneXpert MRSA Assay (FDA approved for NASAL specimens only), is one component of Alda Gaultney comprehensive MRSA colonization surveillance program. It is not intended to diagnose MRSA infection nor to guide or monitor treatment for MRSA infections. Test performance is not FDA approved in patients less than 63 years old. Performed at [facility], [address] [place], [place] [address]   Culture, blood (Routine X 2) w Reflex to ID Panel     Status: None (Preliminary result)   Collection Time: 10/27/23  6:59 PM   Specimen: BLOOD  Result Value Ref Range Status   Specimen Description BLOOD BLOOD RIGHT HAND  Final   Special Requests   Final    AEROBIC BOTTLE ONLY Blood Culture results may not be optimal due to an inadequate volume of blood  received in culture bottles   Culture   Final    NO GROWTH 3 DAYS Performed at [facility], [address] [place], [place] [address]    Report Status PENDING  Incomplete  Culture, blood (Routine X 2) w Reflex to ID Panel     Status: None (Preliminary result)   Collection Time: 10/27/23  6:59 PM   Specimen: BLOOD  Result Value Ref Range Status   Specimen Description BLOOD BLOOD RIGHT HAND  Final  Special Requests   Final    AEROBIC BOTTLE ONLY Blood Culture results may not be optimal due to an inadequate volume of blood received in culture bottles   Culture   Final    NO GROWTH 3 DAYS Performed at [facility], [address], [place], [place] [address]    Report Status PENDING  Incomplete  Blood culture (routine x 2)     Status: None (Preliminary result)   Collection Time: 10/29/23 10:29 AM   Specimen: BLOOD  Result Value Ref Range Status   Specimen Description BLOOD BLOOD RIGHT ARM RAC  Final   Special Requests   Final    BOTTLES DRAWN AEROBIC AND ANAEROBIC Blood Culture adequate volume   Culture   Final    NO GROWTH < 24 HOURS Performed at [facility], [address], [place], [place] [address]    Report Status PENDING  Incomplete  Blood culture (routine x 2)     Status: None (Preliminary result)   Collection Time: 10/29/23 10:29 AM   Specimen: BLOOD  Result Value Ref Range Status   Specimen Description BLOOD BLOOD RIGHT ARM LOWER ARM  Final   Special Requests   Final    BOTTLES DRAWN AEROBIC AND ANAEROBIC Blood Culture adequate volume   Culture   Final    NO GROWTH < 24 HOURS Performed at [facility], [address] [place], [place] [address]    Report Status PENDING  Incomplete  Surgical PCR screen     Status: Abnormal   Collection Time: 10/30/23  5:53 AM   Specimen: Nasal Mucosa; Nasal Swab  Result Value Ref Range Status   MRSA, PCR NEGATIVE NEGATIVE Final   Staphylococcus aureus POSITIVE (Stanislav Gervase) NEGATIVE Final    Comment: (NOTE) The Xpert SA Assay (FDA approved  for NASAL specimens in patients 94 years of age and older), is one component of Quamir Willemsen comprehensive surveillance program. It is not intended to diagnose infection nor to guide or monitor treatment. Performed at [facility], [address] [place], [place] [address]          Radiology Studies: CT Hand Left Wo Contrast Addendum Date: 10/29/2023 ADDENDUM REPORT: 10/29/2023 12:32 ADDENDUM: These results were called by telephone at the time of interpretation on 10/29/2023 at 12:08 pm to provider JOSEPH ZAMMIT , who verbally acknowledged these results. Electronically Signed   By: Harrietta Sherry M.D.   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 Blisters on the posterior left hand. Pain. EXAM: CT OF THE LEFT HAND WITHOUT CONTRAST TECHNIQUE: Multidetector CT imaging of the left hand was performed according to the standard protocol. Multiplanar CT image reconstructions were also generated. RADIATION DOSE REDUCTION: This exam was performed according to the departmental dose-optimization program which includes automated exposure control, adjustment of the mA and/or kV according to patient size and/or use of iterative reconstruction technique. COMPARISON:  Left hand radiograph dated 11/10/2023. FINDINGS: Bones/Joint/Cartilage No acute fracture or dislocation. Status post amputation of the third proximal, middle, and distal phalanges. No evidence of acute osteolysis or erosive changes. Mild degenerative arthropathy throughout the hand. Ligaments Ligaments are suboptimally evaluated by CT. Soft tissue / Muscles and Tendons Blistering along the dorsal hand. Marked subcutaneous edema along the dorsal hand extending to the underlying extensor digitorum tendons with numerous scattered foci of soft tissue gas. Surrounding fat stranding. No discrete loculated fluid collection. Peripheral vascular calcifications. IMPRESSION: 1. Blistering and marked  subcutaneous  edema with stranding along the dorsal hand extending to the underlying extensor digitorum tendons with numerous scattered foci of soft tissue gas. These findings are concerning for gas-forming soft tissue infection. No discrete loculated fluid collection. 2. No acute osseous abnormality. 3. Status post amputation of the third digit. Electronically Signed: By: Harrietta Sherry M.D. On: 10/29/2023 12:02   DG Hand Complete Left Result Date: 10/29/2023 CLINICAL DATA:  Pain. EXAM: LEFT HAND - COMPLETE 3+ VIEW COMPARISON:  None Available. FINDINGS: There is amputation of the middle finger at the level of metacarpophalangeal joint. No acute fracture or dislocation. No aggressive osseous lesion. 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 No periarticular osteopenia or bony erosions. No radiopaque foreign bodies. There is diffuse soft tissue swelling over the dorsum of the hand. There are few foci of air within the soft tissue. However, underlying bones appear intact. No focal bone erosions. IMPRESSION: 1. No acute osseous abnormality of the left hand. 2. Amputation of middle finger at the level of metacarpophalangeal joint. 3. There is diffuse soft tissue swelling over the dorsum of the hand. There are few foci of air within the soft tissue. However, underlying bones appear intact. No focal bone erosions. Electronically Signed   By: Ree Molt M.D.   On: 10/29/2023 10:09        Scheduled Meds:  acetaminophen   1,000 mg Oral Q8H   aspirin   81 mg Oral Daily   busPIRone   5 mg Oral BID   [START ON 11/01/2023] cinacalcet   90 mg Oral Q M,W,F-HD   DULoxetine   60 mg Oral Daily   ezetimibe   10 mg Oral Daily   insulin  aspart  0-5 Units Subcutaneous QHS   insulin  aspart  0-9 Units Subcutaneous TID WC   metoprolol  succinate  50 mg Oral Q T,Th,Sa-HD   multivitamin  1 tablet Oral Daily   mupirocin  ointment   1 Application Nasal BID   sertraline   50 mg Oral Daily   sucroferric oxyhydroxide  1,000 mg Oral TID WC   torsemide   100 mg Oral Q T,Th,S,Su   Continuous Infusions:  ceFEPime  (MAXIPIME ) IV     [START ON 11/01/2023] vancomycin        LOS: 1 day    Time spent: over 30 min     Meliton Monte, MD [facility]   To contact the attending provider between 7A-7P or the covering provider during after hours 7P-7A, please log into the web site [email or web address] and access using universal [place] password for that web site. If you do not have the password, please call the hospital operator.  10/30/2023, 1:52 PM

## 2023-10-30 NOTE — Anesthesia Postprocedure Evaluation (Signed)
 Anesthesia Post Note  Patient: Kathryn Beck  Procedure(s) Performed: IRRIGATION AND DEBRIDEMENT WOUND (Left)     Patient location during evaluation: PACU Anesthesia Type: General Level of consciousness: awake and alert Pain management: pain level controlled Vital Signs Assessment: post-procedure vital signs reviewed and stable Respiratory status: spontaneous breathing, nonlabored ventilation, respiratory function stable and patient connected to nasal cannula oxygen  Cardiovascular status: blood pressure returned to baseline and stable Postop Assessment: no apparent nausea or vomiting Anesthetic complications: no   No notable events documented.  Last Vitals:  Vitals:   10/30/23 1951 10/30/23 2008  BP:  139/65  Pulse: 73 72  Resp: 18 17  Temp:  36.8 C  SpO2: 100% 100%    Last Pain:  Vitals:   10/30/23 2008  TempSrc: Oral  PainSc:                  Thom JONELLE Peoples

## 2023-10-30 NOTE — Plan of Care (Signed)
  Problem: Elimination: Goal: Will not experience complications related to bowel motility Outcome: Progressing Goal: Will not experience complications related to urinary retention Outcome: Progressing   Problem: Coping: Goal: Level of anxiety will decrease Outcome: Progressing   Problem: Nutrition: Goal: Adequate nutrition will be maintained Outcome: Progressing   Problem: Activity: Goal: Risk for activity intolerance will decrease Outcome: Progressing

## 2023-10-31 ENCOUNTER — Encounter (HOSPITAL_COMMUNITY): Payer: Self-pay | Admitting: Orthopedic Surgery

## 2023-10-31 DIAGNOSIS — N186 End stage renal disease: Secondary | ICD-10-CM | POA: Diagnosis not present

## 2023-10-31 DIAGNOSIS — L02512 Cutaneous abscess of left hand: Secondary | ICD-10-CM | POA: Diagnosis not present

## 2023-10-31 DIAGNOSIS — Z992 Dependence on renal dialysis: Secondary | ICD-10-CM | POA: Diagnosis not present

## 2023-10-31 DIAGNOSIS — L03114 Cellulitis of left upper limb: Secondary | ICD-10-CM | POA: Diagnosis not present

## 2023-10-31 DIAGNOSIS — N2581 Secondary hyperparathyroidism of renal origin: Secondary | ICD-10-CM | POA: Diagnosis not present

## 2023-10-31 LAB — GLUCOSE, CAPILLARY
Glucose-Capillary: 139 mg/dL — ABNORMAL HIGH (ref 70–99)
Glucose-Capillary: 184 mg/dL — ABNORMAL HIGH (ref 70–99)
Glucose-Capillary: 202 mg/dL — ABNORMAL HIGH (ref 70–99)
Glucose-Capillary: 126 mg/dL — ABNORMAL HIGH (ref 70–99)

## 2023-10-31 LAB — RENAL FUNCTION PANEL
Albumin: 2.1 g/dL — ABNORMAL LOW (ref 3.5–5.0)
Anion gap: 16 — ABNORMAL HIGH (ref 5–15)
BUN: 58 mg/dL — ABNORMAL HIGH (ref 8–23)
CO2: 22 mmol/L (ref 22–32)
Calcium: 9.3 mg/dL (ref 8.9–10.3)
Chloride: 104 mmol/L (ref 98–111)
Creatinine, Ser: 5.84 mg/dL — ABNORMAL HIGH (ref 0.44–1.00)
GFR, Estimated: 7 mL/min — ABNORMAL LOW (ref >60)
Glucose, Bld: 120 mg/dL — ABNORMAL HIGH (ref 70–99)
Phosphorus: 6.5 mg/dL — ABNORMAL HIGH (ref 2.5–4.6)
Potassium: 5.4 mmol/L — ABNORMAL HIGH (ref 3.5–5.1)
Sodium: 142 mmol/L (ref 135–145)

## 2023-10-31 LAB — CBC WITH DIFFERENTIAL/PLATELET
Abs Immature Granulocytes: 0.8 K/uL — ABNORMAL HIGH (ref 0.00–0.07)
Basophils Absolute: 0.1 K/uL (ref 0.0–0.1)
Basophils Relative: 0 %
Eosinophils Absolute: 0 K/uL (ref 0.0–0.5)
Eosinophils Relative: 0 %
HCT: 30 % — ABNORMAL LOW (ref 36.0–46.0)
Hemoglobin: 9.6 g/dL — ABNORMAL LOW (ref 12.0–15.0)
Immature Granulocytes: 4 %
Lymphocytes Relative: 4 %
Lymphs Abs: 1 K/uL (ref 0.7–4.0)
MCH: 24.2 pg — ABNORMAL LOW (ref 26.0–34.0)
MCHC: 32 g/dL (ref 30.0–36.0)
MCV: 75.8 fL — ABNORMAL LOW (ref 80.0–100.0)
Monocytes Absolute: 1.2 K/uL — ABNORMAL HIGH (ref 0.1–1.0)
Monocytes Relative: 5 %
Neutro Abs: 19.5 K/uL — ABNORMAL HIGH (ref 1.7–7.7)
Neutrophils Relative %: 87 %
Platelets: 417 K/uL — ABNORMAL HIGH (ref 150–400)
RBC: 3.96 MIL/uL (ref 3.87–5.11)
RDW: 17.6 % — ABNORMAL HIGH (ref 11.5–15.5)
WBC: 22.5 K/uL — ABNORMAL HIGH (ref 4.0–10.5)
nRBC: 0.4 % — ABNORMAL HIGH (ref 0.0–0.2)

## 2023-10-31 LAB — HEPATITIS B SURFACE ANTIGEN: Hepatitis B Surface Ag: NONREACTIVE

## 2023-10-31 MED ORDER — POLYETHYLENE GLYCOL 3350 17 G PO PACK
17.0000 g | PACK | Freq: Two times a day (BID) | ORAL | Status: DC
  Administered 2023-10-31 – 2023-11-03 (×10): 17 g via ORAL
  Filled 2023-10-31 (×10): qty 1

## 2023-10-31 MED ORDER — CHLORHEXIDINE GLUCONATE CLOTH 2 % EX PADS
6.0000 | MEDICATED_PAD | Freq: Every day | CUTANEOUS | Status: DC
  Administered 2023-10-31 – 2023-11-03 (×7): 6 via TOPICAL

## 2023-10-31 MED ORDER — SODIUM ZIRCONIUM CYCLOSILICATE 10 G PO PACK
10.0000 g | PACK | Freq: Once | ORAL | Status: AC
  Administered 2023-10-31: 10 g via ORAL
  Filled 2023-10-31: qty 1

## 2023-10-31 MED ORDER — SODIUM CHLORIDE 0.9 % IV SOLN
2.0000 g | INTRAVENOUS | Status: DC
  Administered 2023-10-31 – 2023-11-01 (×3): 2 g via INTRAVENOUS
  Filled 2023-10-31 (×2): qty 20

## 2023-10-31 MED ORDER — APIXABAN 5 MG PO TABS
5.0000 mg | ORAL_TABLET | Freq: Two times a day (BID) | ORAL | Status: DC
  Administered 2023-10-31: 5 mg via ORAL
  Filled 2023-10-31 (×2): qty 1

## 2023-10-31 MED ORDER — CALCITRIOL 0.5 MCG PO CAPS
1.0000 ug | ORAL_CAPSULE | ORAL | Status: DC
  Administered 2023-11-01 – 2023-11-03 (×4): 1 ug via ORAL
  Filled 2023-10-31: qty 2

## 2023-10-31 MED ORDER — DARBEPOETIN ALFA 40 MCG/0.4ML IJ SOSY
40.0000 ug | PREFILLED_SYRINGE | INTRAMUSCULAR | Status: DC
  Filled 2023-10-31: qty 0.4

## 2023-10-31 NOTE — Progress Notes (Signed)
 Pt appears drowsy since coming back from OR and getting Fentanyl  25 mcg per RN day shift reported. Pt is easily waking up with calling her name, able to follow commands, oriented x 4, on room air, normal respiratory effort, stable hemodynamically, afebrile. No distress.  Left hand with the original ACE wrap dressing is dry and clean, no drainage. Pain is well tolerated with Tylenol  tonight. She is able to rest well with no major complaints.  Family members visit at bedside and report that Pt has no bowel movement for 5-6 days. Her last BM on 10/26/23 per document in flowsheet. We will hand off to the day shift team requesting for bowel regimens. Pt has no obvious acute distress noted. Plan of care is reviewed. We will continue to monitor.    Problem: Clinical Measurements: Goal: Ability to maintain clinical measurements within normal limits will improve Outcome: Progressing Goal: Will remain free from infection Outcome: Progressing Goal: Diagnostic test results will improve Outcome: Progressing Goal: Respiratory complications will improve Outcome: Progressing Goal: Cardiovascular complication will be avoided Outcome: Progressing   Problem: Activity: Goal: Risk for activity intolerance will decrease Outcome: Progressing   Problem: Nutrition: Goal: Adequate nutrition will be maintained Outcome: Progressing   Problem: Coping: Goal: Level of anxiety will decrease Outcome: Progressing   Problem: Elimination: Goal: Will not experience complications related to bowel motility Outcome: Progressing Goal: Will not experience complications related to urinary retention Outcome: Progressing   Problem: Pain Managment: Goal: General experience of comfort will improve and/or be controlled Outcome: Progressing   Problem: Safety: Goal: Ability to remain free from injury will improve Outcome: Progressing   Problem: Skin Integrity: Goal: Risk for impaired skin integrity will decrease Outcome:  Progressing   Problem: Fluid Volume: Goal: Ability to maintain a balanced intake and output will improve Outcome: Progressing   Problem: Health Behavior/Discharge Planning: Goal: Ability to identify and utilize available resources and services will improve Outcome: Progressing Goal: Ability to manage health-related needs will improve Outcome: Progressing  Wendi Dash, RN

## 2023-10-31 NOTE — Plan of Care (Signed)
  Problem: Activity: Goal: Risk for activity intolerance will decrease Outcome: Progressing   Problem: Nutrition: Goal: Adequate nutrition will be maintained Outcome: Progressing   Problem: Elimination: Goal: Will not experience complications related to bowel motility Outcome: Progressing Goal: Will not experience complications related to urinary retention Outcome: Progressing   Problem: Safety: Goal: Ability to remain free from injury will improve Outcome: Progressing

## 2023-10-31 NOTE — Progress Notes (Addendum)
 [facility] Progress Note   Subjective:    Seen and examined patient on bedside. Reports ongoing L hand pain. Appears pain medication was recently given. Remains on RA and denies SOB and N/V. Next HD 8/11 per her usual schedule.  Objective Vitals:   10/30/23 2008 10/30/23 2338 10/31/23 0330 10/31/23 0741  BP: 139/65 139/67 131/66 (!) 141/76  Pulse: 72 69 69 73  Resp: 17 16 18 18   Temp: 98.2 F (36.8 C) 98.3 F (36.8 C) 97.8 F (36.6 C) 97.7 F (36.5 C)  TempSrc: Oral Oral Oral Oral  SpO2: 100% 100% 100% 100%  Weight:      Height:       Physical Exam General: Awake; NAD Heart: S1 and S2; RRR; No murmurs Lungs: Clear throughout Abdomen: Soft and non-tender Extremities: B/l BKAs; No edema b/l stumps Dialysis Access:  L AVF   Filed Weights   10/29/23 0901 10/30/23 0706  Weight: 76 kg 76 kg    Intake/Output Summary (Last 24 hours) at 10/31/2023 0928 Last data filed at 10/30/2023 1951 Gross per 24 hour  Intake 470 ml  Output --  Net 470 ml    Additional Objective Labs: Basic Metabolic Panel: Recent Labs  Lab 10/29/23 1029 10/30/23 0432 10/31/23 0439  NA 140 142 142  K 4.1 4.4 5.4*  CL 98 105 104  CO2 25 22 22   GLUCOSE 95 95 120*  BUN 34* 43* 58*  CREATININE 3.99* 4.89* 5.84*  CALCIUM  8.4* 8.6* 9.3  PHOS  --  5.3* 6.5*   Liver Function Tests: Recent Labs  Lab 10/27/23 1108 10/29/23 1029 10/30/23 0432 10/31/23 0439  AST 45* 22  --   --   ALT 44 33  --   --   ALKPHOS 130* 136*  --   --   BILITOT 1.2 0.6  --   --   PROT 7.1 6.8  --   --   ALBUMIN  3.2* 2.7* 2.2* 2.1*   No results for input(s): LIPASE, AMYLASE in the last 168 hours. CBC: Recent Labs  Lab 10/27/23 1108 10/28/23 0444 10/29/23 1029 10/30/23 0432 10/31/23 0439  WBC 16.9* 15.9* 19.3* 18.8* 22.5*  NEUTROABS 14.1*  --  16.5*  --  19.5*  HGB 11.1* 9.8* 9.9* 9.7* 9.6*  HCT 35.6* 32.0* 32.8* 30.5* 30.0*  MCV 79.5* 80.0 78.3* 76.8* 75.8*  PLT 329 311 381 395 417*    Blood Culture    Component Value Date/Time   SDES WOUND 10/30/2023 0809   SPECREQUEST LEFT HAND FLUID COLLECTION 10/30/2023 0809   CULT  10/30/2023 0809    MODERATE STAPHYLOCOCCUS AUREUS SUSCEPTIBILITIES TO FOLLOW Performed at [facility], [address], [place], [place] [address]    REPTSTATUS PENDING 10/30/2023 [phone]    Cardiac Enzymes: No results for input(s): CKTOTAL, CKMB, CKMBINDEX, TROPONINI in the last 168 hours. CBG: Recent Labs  Lab 10/30/23 0848 10/30/23 1219 10/30/23 1611 10/30/23 2132 10/31/23 0739  GLUCAP 81 176* 258* 219* 139*   Iron Studies: No results for input(s): IRON, TIBC, TRANSFERRIN, FERRITIN in the last 72 hours. Lab Results  Component Value Date   INR 1.0 03/03/2019   INR 1.17 09/02/2017   Studies/Results: CT Hand Left Wo Contrast Addendum Date: 10/29/2023 ADDENDUM REPORT: 10/29/2023 12:32 ADDENDUM: These results were called by telephone at the time of interpretation on 10/29/2023 at 12:08 pm to provider JOSEPH ZAMMIT , who verbally acknowledged these results. Electronically Signed   By: Harrietta Sherry M.D.   On: 10/29/2023  12:32   Result Date: 10/29/2023 CLINICAL DATA:  History of middle finger amputation on 09/29/2023. Blisters on the posterior left hand. Pain. EXAM: CT OF THE LEFT HAND WITHOUT CONTRAST TECHNIQUE: Multidetector CT imaging of the left hand was performed according to the standard protocol. Multiplanar CT image reconstructions were also generated. RADIATION DOSE REDUCTION: This exam was performed according to the departmental dose-optimization program which includes automated exposure control, adjustment of the mA and/or kV according to patient size and/or use of iterative reconstruction technique. COMPARISON:  Left hand radiograph dated 11/10/2023. FINDINGS: Bones/Joint/Cartilage No acute fracture or dislocation. Status post amputation of the third proximal, middle, and distal phalanges. No evidence of acute  osteolysis or erosive changes. Mild degenerative arthropathy throughout the hand. Ligaments Ligaments are suboptimally evaluated by CT. Soft tissue / Muscles and Tendons Blistering along the dorsal hand. Marked subcutaneous edema along the dorsal hand extending to the underlying extensor digitorum tendons with numerous scattered foci of soft tissue gas. Surrounding fat stranding. No discrete loculated fluid collection. Peripheral vascular calcifications. IMPRESSION: 1. Blistering and marked subcutaneous edema with stranding along the dorsal hand extending to the underlying extensor digitorum tendons with numerous scattered foci of soft tissue gas. These findings are concerning for gas-forming soft tissue infection. No discrete loculated fluid collection. 2. No acute osseous abnormality. 3. Status post amputation of the third digit. Electronically Signed: By: Harrietta Sherry M.D. On: 10/29/2023 12:02   DG Hand Complete Left Result Date: 10/29/2023 CLINICAL DATA:  Pain. EXAM: LEFT HAND - COMPLETE 3+ VIEW COMPARISON:  None Available. FINDINGS: There is amputation of the middle finger at the level of metacarpophalangeal joint. No acute fracture or dislocation. No aggressive osseous lesion. 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 No periarticular osteopenia or bony erosions. No radiopaque foreign bodies. There is diffuse soft tissue swelling over the dorsum of the hand. There are few foci of air within the soft tissue. However, underlying bones appear intact. No focal bone erosions. IMPRESSION: 1. No acute osseous abnormality of the left hand. 2. Amputation of middle finger at the level of metacarpophalangeal joint. 3. There is diffuse soft tissue swelling over the dorsum of the hand. There are few foci of air within the soft tissue. However, underlying bones appear intact. No focal bone erosions. Electronically  Signed   By: Ree Molt M.D.   On: 10/29/2023 10:09    Medications:  ceFEPime  (MAXIPIME ) IV 1 g (10/30/23 1420)   [START ON 11/01/2023] vancomycin       acetaminophen   1,000 mg Oral Q8H   aspirin   81 mg Oral Daily   busPIRone   5 mg Oral BID   [START ON 11/01/2023] cinacalcet   90 mg Oral Q M,W,F-HD   DULoxetine   60 mg Oral Daily   ezetimibe   10 mg Oral Daily   insulin  aspart  0-5 Units Subcutaneous QHS   insulin  aspart  0-9 Units Subcutaneous TID WC   metoprolol  succinate  50 mg Oral Q T,Th,Sa-HD   metroNIDAZOLE   500 mg Oral Q12H   multivitamin  1 tablet Oral Daily   mupirocin  ointment  1 Application Nasal BID   polyethylene glycol  17 g Oral BID   sertraline   50 mg Oral Daily   sucroferric oxyhydroxide  1,000 mg Oral TID WC   torsemide   100 mg Oral Q T,Th,S,Su    Dialysis Orders: [facility] - MWF 3.5hrs BFR 400 DFR 500  2K/2.5Ca  EDW 73.5kg Heparin  bolus 1000  units Calcitriol  with HD - last dose 10/29/23 Sensipar  90mg  with HD - last dose 10/29/23 Micera 50mcg Q2wks - last dose 10/18/23 [name]  1000mg  TID with meals  Assessment/Plan: Left hand pain with swelling - L hand CT showed concern for soft tissue infection. S/p L hand I&D with drain placement, per Vanc, Cefepime , and Flagyl . Awaiting results from blood cx and fluid cx. ESRD -  on HD MWF.  Plan for next HD 8/11 per her usual schedule. K+ now 5.4-ordered Lokelma  10GM X 1. Hypertension/volume  - Bps acceptable. Continue home medications. Anemia of CKD - Hgb 9.7. Next ESA due 8/11-will order. Secondary Hyperparathyroidism -  Added calcitriol  and continue Sensipar . On binders. Nutrition - Renal diet with fluid restriction  Charmaine Piety, NP [facility] 10/31/2023,9:28 AM  LOS: 2 days

## 2023-10-31 NOTE — Plan of Care (Signed)
 HAND SURGERY UPDATE NOTE:  Continue to follow results of cultures. Drain to remain in currently, dressing changes per nursing prn.  Recommend using Vashe for dressings.    Hand surgery will continue to follow  TRUE Shackleford, MD Hand Surgery, [facility]

## 2023-10-31 NOTE — Progress Notes (Signed)
 PROGRESS NOTE    Kathryn Beck  FMW:[id number] DOB: 09-02-1955 DOA: 10/29/2023 PCP: Kathryn Rollene BRAVO, MD  Chief Complaint  Patient presents with   Blister    Brief Narrative:   Kathryn Beck is Kathryn Beck 68 year old female with Kathryn Beck history of newly diagnosed atrial fib on apixaban , PAD  (s/p R BKA in 05/2022, prior lithotripsy and stenting of the left superficial femoral and popliteal artery in 06/2022 and ultimately L BKA in 09/2022), HTN, HLD, Type 2 DM, bilateral adrenal adenoma (followed by Endocrinology), chronic HFpEF, aortic stenosis, OSA, ESRD (MWF), history of TIA's, history of recurrent chest pain  (cath in 12/2018 showing normal coronary arteries) presenting with left hand pain and blisters.   Assessment & Plan:   Principal Problem:   Abscess of left hand Active Problems:   Left hand pain  Skin Soft Tissue Infection - CT with blistering and marked subcutaneous edema with stranding along the dorsal hand extending to the underlying extensor digitorum tendons with numerous scattered foci of soft tissue gas - continue abx with vanc, ceftriaxone , flagyl  - Follow-up blood cultures  - now s/p L hand and forearm I&D, placement of drain 8/9 - follow surgical culture -> moderate staph aureus  - Judicious opioids   Paroxysmal atrial fibrillation - Currently in sinus rhythm - Continue metoprolol  - ok to resume eliquis  today based on my discussion with ortho    ESRD  Hyperkalemia - volume per renal  - on T/Th/S/Su torsemide  - hyperkalemia management per renal    Essential hypertension - Continue metoprolol  succinate and amlodipine    Anxiety/depression - Cymbalta , sertraline , buspar    Diabetes mellitus type 2 with hyperglycemia - NovoLog  sliding scale - Reduced dose of semaglutide  - 10/27/2023 hemoglobin A1c 6.7   PAD - Status post bilateral BKA - s/p Stent, left superficial femoral and popliteal artery 07/21/22 ([place]) - Continue aspirin , will resume eliquis   today    DVT prophylaxis: heparin  Code Status: full Family Communication: none Disposition:   Status is: Inpatient Remains inpatient appropriate because: need for continued inpatient care   Consultants:  Renal orthopedics  Procedures:   8/9 Left hand and forearm irrigation and debridement, placement of drain with loose closure  Antimicrobials:  Anti-infectives (From admission, onward)    Start     Dose/Rate Route Frequency Ordered Stop   11/01/23 1600  vancomycin  (VANCOREADY) IVPB 750 mg/150 mL        750 mg 150 mL/hr over 60 Minutes Intravenous Every M-W-F (Hemodialysis) 10/29/23 1337     10/30/23 1445  metroNIDAZOLE  (FLAGYL ) tablet 500 mg        500 mg Oral Every 12 hours 10/30/23 1356     10/30/23 1200  ceFEPIme  (MAXIPIME ) 1 g in sodium chloride  0.9 % 100 mL IVPB        1 g 200 mL/hr over 30 Minutes Intravenous Every 24 hours 10/29/23 1337     10/29/23 1130  vancomycin  (VANCOREADY) IVPB 1500 mg/300 mL        1,500 mg 150 mL/hr over 120 Minutes Intravenous  Once 10/29/23 1110 10/29/23 1336   10/29/23 1130  ceFEPIme  (MAXIPIME ) 2 g in sodium chloride  0.9 % 100 mL IVPB        2 g 200 mL/hr over 30 Minutes Intravenous  Once 10/29/23 1117 10/29/23 1310       Subjective: No complaints  Objective: Vitals:   10/30/23 2338 10/31/23 0330 10/31/23 0741 10/31/23 1313  BP: 139/67 131/66 (!) 141/76 (!) 148/68  Pulse: 69 69 73  80  Resp: 16 18 18 18   Temp: 98.3 F (36.8 C) 97.8 F (36.6 C) 97.7 F (36.5 C) 98 F (36.7 C)  TempSrc: Oral Oral Oral Oral  SpO2: 100% 100% 100% 100%  Weight:      Height:        Intake/Output Summary (Last 24 hours) at 10/31/2023 1501 Last data filed at 10/31/2023 0941 Gross per 24 hour  Intake 830 ml  Output --  Net 830 ml   Filed Weights   10/29/23 0901 10/30/23 0706  Weight: 76 kg 76 kg    Examination:  General: No acute distress. Cardiovascular: RRR Lungs: unlabored Abdomen: Soft, nontender, nondistended Neurological:  Alert and oriented 3. Moves all extremities 4 with equal strength. Cranial nerves II through XII grossly intact. Extremities: L arm in splint    Data Reviewed: I have personally reviewed following labs and imaging studies  CBC: Recent Labs  Lab 10/27/23 1108 10/28/23 0444 10/29/23 1029 10/30/23 0432 10/31/23 0439  WBC 16.9* 15.9* 19.3* 18.8* 22.5*  NEUTROABS 14.1*  --  16.5*  --  19.5*  HGB 11.1* 9.8* 9.9* 9.7* 9.6*  HCT 35.6* 32.0* 32.8* 30.5* 30.0*  MCV 79.5* 80.0 78.3* 76.8* 75.8*  PLT 329 311 381 395 417*    Basic Metabolic Panel: Recent Labs  Lab 10/27/23 1108 10/29/23 1029 10/30/23 0432 10/31/23 0439  NA 138 140 142 142  K 4.0 4.1 4.4 5.4*  CL 95* 98 105 104  CO2 24 25 22 22   GLUCOSE 105* 95 95 120*  BUN 26* 34* 43* 58*  CREATININE 3.53* 3.99* 4.89* 5.84*  CALCIUM  8.9 8.4* 8.6* 9.3  MG 2.0  --   --   --   PHOS  --   --  5.3* 6.5*    GFR: Estimated Creatinine Clearance: 9.5 mL/min (Fatima Fedie) (by C-G formula based on SCr of 5.84 mg/dL (H)).  Liver Function Tests: Recent Labs  Lab 10/27/23 1108 10/29/23 1029 10/30/23 0432 10/31/23 0439  AST 45* 22  --   --   ALT 44 33  --   --   ALKPHOS 130* 136*  --   --   BILITOT 1.2 0.6  --   --   PROT 7.1 6.8  --   --   ALBUMIN  3.2* 2.7* 2.2* 2.1*    CBG: Recent Labs  Lab 10/30/23 1219 10/30/23 1611 10/30/23 2132 10/31/23 0739 10/31/23 1229  GLUCAP 176* 258* 219* 139* 184*     Recent Results (from the past 240 hours)  MRSA Next Gen by PCR, Nasal     Status: None   Collection Time: 10/27/23  1:43 PM   Specimen: Nasal Mucosa; Nasal Swab  Result Value Ref Range Status   MRSA by PCR Next Gen NOT DETECTED NOT DETECTED Final    Comment: (NOTE) The GeneXpert MRSA Assay (FDA approved for NASAL specimens only), is one component of Pollyanna Levay comprehensive MRSA colonization surveillance program. It is not intended to diagnose MRSA infection nor to guide or monitor treatment for MRSA infections. Test performance is not  FDA approved in patients less than 65 years old. Performed at [facility], [address] [place], [place] [address]   Culture, blood (Routine X 2) w Reflex to ID Panel     Status: None (Preliminary result)   Collection Time: 10/27/23  6:59 PM   Specimen: BLOOD  Result Value Ref Range Status   Specimen Description BLOOD BLOOD RIGHT HAND  Final   Special Requests   Final  AEROBIC BOTTLE ONLY Blood Culture results may not be optimal due to an inadequate volume of blood received in culture bottles   Culture   Final    NO GROWTH 4 DAYS Performed at [facility], [address], [place], [place] [address]    Report Status PENDING  Incomplete  Culture, blood (Routine X 2) w Reflex to ID Panel     Status: None (Preliminary result)   Collection Time: 10/27/23  6:59 PM   Specimen: BLOOD  Result Value Ref Range Status   Specimen Description BLOOD BLOOD RIGHT HAND  Final   Special Requests   Final    AEROBIC BOTTLE ONLY Blood Culture results may not be optimal due to an inadequate volume of blood received in culture bottles   Culture   Final    NO GROWTH 4 DAYS Performed at [facility], [address] [place], [place] [address]    Report Status PENDING  Incomplete  Blood culture (routine x 2)     Status: None (Preliminary result)   Collection Time: 10/29/23 10:29 AM   Specimen: BLOOD  Result Value Ref Range Status   Specimen Description BLOOD BLOOD RIGHT ARM RAC  Final   Special Requests   Final    BOTTLES DRAWN AEROBIC AND ANAEROBIC Blood Culture adequate volume   Culture   Final    NO GROWTH 2 DAYS Performed at [facility], [address] [place], [place] [address]    Report Status PENDING  Incomplete  Blood culture (routine x 2)     Status: None (Preliminary result)   Collection Time: 10/29/23 10:29 AM   Specimen: BLOOD  Result Value Ref Range Status   Specimen Description BLOOD BLOOD RIGHT ARM LOWER ARM  Final   Special Requests   Final    BOTTLES DRAWN AEROBIC AND ANAEROBIC  Blood Culture adequate volume   Culture   Final    NO GROWTH 2 DAYS Performed at [facility], [address] [place], [place] [address]    Report Status PENDING  Incomplete  Surgical PCR screen     Status: Abnormal   Collection Time: 10/30/23  5:53 AM   Specimen: Nasal Mucosa; Nasal Swab  Result Value Ref Range Status   MRSA, PCR NEGATIVE NEGATIVE Final   Staphylococcus aureus POSITIVE (Nadyne Gariepy) NEGATIVE Final    Comment: (NOTE) The Xpert SA Assay (FDA approved for NASAL specimens in patients 76 years of age and older), is one component of Edra Riccardi comprehensive surveillance program. It is not intended to diagnose infection nor to guide or monitor treatment. Performed at [facility], [address], [place], [place] [address]   Aerobic/Anaerobic Culture w Gram Stain (surgical/deep wound)     Status: None (Preliminary result)   Collection Time: 10/30/23  8:09 AM   Specimen: Wound; Body Fluid  Result Value Ref Range Status   Specimen Description WOUND  Final   Special Requests LEFT HAND FLUID COLLECTION  Final   Gram Stain   Final    RARE WBC PRESENT, PREDOMINANTLY PMN ABUNDANT GRAM POSITIVE COCCI    Culture   Final    MODERATE STAPHYLOCOCCUS AUREUS SUSCEPTIBILITIES TO FOLLOW Performed at [facility], [address] [place], [place] [address]    Report Status PENDING  Incomplete         Radiology Studies: No results found.       Scheduled Meds:  acetaminophen   1,000 mg Oral Q8H   apixaban   5 mg Oral BID  aspirin   81 mg Oral Daily   busPIRone   5 mg Oral BID   [START ON 11/01/2023] calcitRIOL   1 mcg Oral Q M,W,F-HD   Chlorhexidine  Gluconate Cloth  6 each Topical Q0600   [START ON 11/01/2023] cinacalcet   90 mg Oral Q M,W,F-HD   [START ON 11/05/2023] darbepoetin (ARANESP ) injection - DIALYSIS  40 mcg Subcutaneous Q Fri-1800   DULoxetine   60 mg Oral Daily   ezetimibe   10 mg Oral Daily   insulin  aspart  0-5 Units Subcutaneous QHS   insulin  aspart  0-9 Units  Subcutaneous TID WC   metoprolol  succinate  50 mg Oral Q T,Th,Sa-HD   metroNIDAZOLE   500 mg Oral Q12H   multivitamin  1 tablet Oral Daily   mupirocin  ointment  1 Application Nasal BID   polyethylene glycol  17 g Oral BID   sertraline   50 mg Oral Daily   sucroferric oxyhydroxide  1,000 mg Oral TID WC   torsemide   100 mg Oral Q T,Th,S,Su   Continuous Infusions:  ceFEPime  (MAXIPIME ) IV 1 g (10/31/23 1241)   [START ON 11/01/2023] vancomycin        LOS: 2 days    Time spent: over 30 min     Meliton Monte, MD [facility]   To contact the attending provider between 7A-7P or the covering provider during after hours 7P-7A, please log into the web site [email or web address] and access using universal [place] password for that web site. If you do not have the password, please call the hospital operator.  10/31/2023, 3:01 PM

## 2023-11-01 DIAGNOSIS — M79642 Pain in left hand: Secondary | ICD-10-CM

## 2023-11-01 DIAGNOSIS — L02512 Cutaneous abscess of left hand: Secondary | ICD-10-CM | POA: Diagnosis not present

## 2023-11-01 LAB — LACTIC ACID, PLASMA: Lactic Acid, Venous: 1.6 mmol/L (ref 0.5–1.9)

## 2023-11-01 LAB — CBC WITH DIFFERENTIAL/PLATELET
Abs Immature Granulocytes: 0.85 K/uL — ABNORMAL HIGH (ref 0.00–0.07)
Basophils Absolute: 0.1 K/uL (ref 0.0–0.1)
Basophils Relative: 0 %
Eosinophils Absolute: 0.1 K/uL (ref 0.0–0.5)
Eosinophils Relative: 0 %
HCT: 30.1 % — ABNORMAL LOW (ref 36.0–46.0)
Hemoglobin: 9.7 g/dL — ABNORMAL LOW (ref 12.0–15.0)
Immature Granulocytes: 4 %
Lymphocytes Relative: 6 %
Lymphs Abs: 1.1 K/uL (ref 0.7–4.0)
MCH: 24.3 pg — ABNORMAL LOW (ref 26.0–34.0)
MCHC: 32.2 g/dL (ref 30.0–36.0)
MCV: 75.3 fL — ABNORMAL LOW (ref 80.0–100.0)
Monocytes Absolute: 1.1 K/uL — ABNORMAL HIGH (ref 0.1–1.0)
Monocytes Relative: 6 %
Neutro Abs: 16.9 K/uL — ABNORMAL HIGH (ref 1.7–7.7)
Neutrophils Relative %: 84 %
Platelets: 430 K/uL — ABNORMAL HIGH (ref 150–400)
RBC: 4 MIL/uL (ref 3.87–5.11)
RDW: 17.6 % — ABNORMAL HIGH (ref 11.5–15.5)
WBC: 20.1 K/uL — ABNORMAL HIGH (ref 4.0–10.5)
nRBC: 0.6 % — ABNORMAL HIGH (ref 0.0–0.2)

## 2023-11-01 LAB — FOLATE: Folate: 32.4 ng/mL (ref >5.9)

## 2023-11-01 LAB — COMPREHENSIVE METABOLIC PANEL WITH GFR
ALT: 25 U/L (ref 0–44)
AST: 18 U/L (ref 15–41)
Albumin: 1.9 g/dL — ABNORMAL LOW (ref 3.5–5.0)
Alkaline Phosphatase: 111 U/L (ref 38–126)
Anion gap: 16 — ABNORMAL HIGH (ref 5–15)
BUN: 75 mg/dL — ABNORMAL HIGH (ref 8–23)
CO2: 22 mmol/L (ref 22–32)
Calcium: 9.1 mg/dL (ref 8.9–10.3)
Chloride: 103 mmol/L (ref 98–111)
Creatinine, Ser: 6.75 mg/dL — ABNORMAL HIGH (ref 0.44–1.00)
GFR, Estimated: 6 mL/min — ABNORMAL LOW (ref >60)
Glucose, Bld: 122 mg/dL — ABNORMAL HIGH (ref 70–99)
Potassium: 5.1 mmol/L (ref 3.5–5.1)
Sodium: 141 mmol/L (ref 135–145)
Total Bilirubin: 0.9 mg/dL (ref 0.0–1.2)
Total Protein: 5.9 g/dL — ABNORMAL LOW (ref 6.5–8.1)

## 2023-11-01 LAB — BLOOD GAS, VENOUS
Acid-Base Excess: 5.6 mmol/L — ABNORMAL HIGH (ref 0.0–2.0)
Bicarbonate: 30.6 mmol/L — ABNORMAL HIGH (ref 20.0–28.0)
Drawn by: 62344
O2 Saturation: 86.8 %
Patient temperature: 36.8
pCO2, Ven: 45 mmHg (ref 44–60)
pH, Ven: 7.44 — ABNORMAL HIGH (ref 7.25–7.43)
pO2, Ven: 49 mmHg — ABNORMAL HIGH (ref 32–45)

## 2023-11-01 LAB — TSH: TSH: 0.393 u[IU]/mL (ref 0.350–4.500)

## 2023-11-01 LAB — GLUCOSE, CAPILLARY
Glucose-Capillary: 81 mg/dL (ref 70–99)
Glucose-Capillary: 107 mg/dL — ABNORMAL HIGH (ref 70–99)
Glucose-Capillary: 119 mg/dL — ABNORMAL HIGH (ref 70–99)

## 2023-11-01 LAB — CULTURE, BLOOD (ROUTINE X 2)
Culture: NO GROWTH
Culture: NO GROWTH

## 2023-11-01 LAB — VITAMIN B12: Vitamin B-12: 1172 pg/mL — ABNORMAL HIGH (ref 180–914)

## 2023-11-01 LAB — MAGNESIUM: Magnesium: 2.1 mg/dL (ref 1.7–2.4)

## 2023-11-01 LAB — AMMONIA: Ammonia: 27 umol/L (ref 9–35)

## 2023-11-01 LAB — PHOSPHORUS: Phosphorus: 6.4 mg/dL — ABNORMAL HIGH (ref 2.5–4.6)

## 2023-11-01 MED ORDER — LIDOCAINE HCL (PF) 1 % IJ SOLN: 5.0000 mL | INTRAMUSCULAR | Status: DC | PRN

## 2023-11-01 MED ORDER — VANCOMYCIN HCL 750 MG/150ML IV SOLN
INTRAVENOUS | Status: AC
  Filled 2023-11-01: qty 150

## 2023-11-01 MED ORDER — HEPARIN SODIUM (PORCINE) 1000 UNIT/ML IJ SOLN: 3800.0000 [IU] | Freq: Once | INTRAMUSCULAR | Status: DC

## 2023-11-01 MED ORDER — FENTANYL CITRATE PF 50 MCG/ML IJ SOSY
PREFILLED_SYRINGE | INTRAMUSCULAR | Status: AC
  Filled 2023-11-01: qty 1

## 2023-11-01 MED ORDER — HEPARIN SODIUM (PORCINE) 1000 UNIT/ML IJ SOLN
1000.0000 [IU] | Freq: Once | INTRAMUSCULAR | Status: AC
  Administered 2023-11-01 (×2): 1000 [IU] via INTRAVENOUS

## 2023-11-01 MED ORDER — LIDOCAINE-PRILOCAINE 2.5-2.5 % EX CREA
1.0000 | TOPICAL_CREAM | CUTANEOUS | Status: DC | PRN
Start: 2023-11-01 — End: 2023-11-01

## 2023-11-01 MED ORDER — AMIODARONE HCL IN DEXTROSE 360-4.14 MG/200ML-% IV SOLN
30.0000 mg/h | INTRAVENOUS | Status: DC
  Administered 2023-11-01 – 2023-11-02 (×4): 30 mg/h via INTRAVENOUS
  Filled 2023-11-01: qty 200

## 2023-11-01 MED ORDER — METOPROLOL SUCCINATE ER 50 MG PO TB24
50.0000 mg | ORAL_TABLET | ORAL | Status: DC
  Administered 2023-11-02 – 2023-11-04 (×3): 50 mg via ORAL
  Filled 2023-11-01 (×2): qty 1

## 2023-11-01 MED ORDER — CALCITRIOL 0.5 MCG PO CAPS
ORAL_CAPSULE | ORAL | Status: AC
  Filled 2023-11-01: qty 2

## 2023-11-01 MED ORDER — AMIODARONE HCL IN DEXTROSE 360-4.14 MG/200ML-% IV SOLN
60.0000 mg/h | INTRAVENOUS | Status: AC
  Administered 2023-11-01 (×4): 60 mg/h via INTRAVENOUS
  Filled 2023-11-01 (×2): qty 200

## 2023-11-01 MED ORDER — PENTAFLUOROPROP-TETRAFLUOROETH EX AERO: 1.0000 | INHALATION_SPRAY | CUTANEOUS | Status: DC | PRN

## 2023-11-01 MED ORDER — HEPARIN SODIUM (PORCINE) 1000 UNIT/ML DIALYSIS: 1000.0000 [IU] | INTRAMUSCULAR | Status: DC | PRN

## 2023-11-01 MED ORDER — CINACALCET HCL 30 MG PO TABS
ORAL_TABLET | ORAL | Status: AC
  Filled 2023-11-01: qty 3

## 2023-11-01 MED ORDER — PROSOURCE PLUS PO LIQD
30.0000 mL | Freq: Two times a day (BID) | ORAL | Status: DC
  Administered 2023-11-01 – 2023-11-05 (×12): 30 mL via ORAL
  Filled 2023-11-01 (×8): qty 30

## 2023-11-01 MED ORDER — HEPARIN (PORCINE) 25000 UT/250ML-% IV SOLN
1200.0000 [IU]/h | INTRAVENOUS | Status: DC
  Administered 2023-11-01 (×2): 1000 [IU]/h via INTRAVENOUS
  Filled 2023-11-01: qty 250

## 2023-11-01 MED ORDER — ALTEPLASE 2 MG IJ SOLR: 2.0000 mg | Freq: Once | INTRAMUSCULAR | Status: DC | PRN

## 2023-11-01 MED ORDER — FENTANYL CITRATE PF 50 MCG/ML IJ SOSY
12.5000 ug | PREFILLED_SYRINGE | INTRAMUSCULAR | Status: DC | PRN
  Filled 2023-11-01: qty 1

## 2023-11-01 MED ORDER — AMIODARONE LOAD VIA INFUSION
150.0000 mg | Freq: Once | INTRAVENOUS | Status: AC
  Administered 2023-11-01 (×2): 150 mg via INTRAVENOUS
  Filled 2023-11-01: qty 83.34

## 2023-11-01 NOTE — Progress Notes (Signed)
 Received patient in bed to unit.  Alert and oriented.  Informed consent signed and in chart.   TX duration: 3 hours and 15 minutes  Patient tolerated well.  Transported back to the room  Alert, without acute distress.  Hand-off given to patient's nurse.   Access used: Left Upper Arm Fistula Access issues: none  Total UF removed: 3L Medication(s) given: Vancomycin , Sensipar , Calcitriol , Fentenyl, see MAR   11/01/23 1130  Vitals  Temp 97.8 F (36.6 C)  Temp Source Oral  BP 114/64  MAP (mmHg) 81  Pulse Rate (!) 43  ECG Heart Rate (!) 148  Resp 10  Oxygen  Therapy  SpO2 100 %  O2 Device Room Air  During Treatment Monitoring  Duration of HD Treatment -hour(s) 3.25 hour(s)  HD Safety Checks Performed Yes  Intra-Hemodialysis Comments Tx completed  Post Treatment  Dialyzer Clearance Clear  Liters Processed 76  Fluid Removed (mL) 3000 mL  Tolerated HD Treatment Yes  AVG/AVF Arterial Site Held (minutes) 7 minutes  AVG/AVF Venous Site Held (minutes) 7 minutes  Fistula / Graft Left Upper arm Arteriovenous fistula  Placement Date/Time: 09/02/17 1150   Placed prior to admission: Yes  Orientation: Left  Access Location: Upper arm  Access Type: Arteriovenous fistula  Status Deaccessed     Camellia Brasil LPN Kidney Dialysis [facility]

## 2023-11-01 NOTE — Inpatient Diabetes Management (Signed)
 Inpatient Diabetes Program Recommendations  AACE/ADA: New Consensus Statement on Inpatient Glycemic Control (2015)  Target Ranges:  Prepandial:   less than 140 mg/dL      Peak postprandial:   less than 180 mg/dL (1-2 hours)      Critically ill patients:  140 - 180 mg/dL   Lab Results  Component Value Date   GLUCAP 126 (H) 10/31/2023   HGBA1C 6.7 (H) 10/27/2023    Review of Glycemic Control  Latest Reference Range & Units 10/31/23 07:39 10/31/23 12:29 10/31/23 17:03 10/31/23 21:01  Glucose-Capillary 70 - 99 mg/dL 860 (H) 815 (H) 797 (H) 126 (H)   Diabetes history: DM 2 Outpatient Diabetes medications: Toujeo  30 units Daily Current orders for Inpatient glycemic control:  Novolog  0-9 units tid + hs  Inpatient Diabetes Program Recommendations:    Note: glucose trends increase after PO intake: -   may consider Novolog  2 units tid if eating >50% of meals if postprandial trends continue to increase.  Thanks,  Clotilda Bull RN, MSN, BC-ADM Inpatient Diabetes Coordinator Team Pager [phone] (8a-5p)

## 2023-11-01 NOTE — Plan of Care (Signed)

## 2023-11-01 NOTE — Progress Notes (Signed)
 PROGRESS NOTE    Kathryn Beck  FMW:[id number] DOB: 1956/01/28 DOA: 10/29/2023 PCP: Kathryn Rollene BRAVO, MD  Chief Complaint  Patient presents with   Blister    Brief Narrative:   Kathryn Beck is Kathryn Beck 69 year old female with Kathryn Beck history of newly diagnosed atrial fib on apixaban , PAD  (s/p R BKA in 05/2022, prior lithotripsy and stenting of the left superficial femoral and popliteal artery in 06/2022 and ultimately L BKA in 09/2022), HTN, HLD, Type 2 DM, bilateral adrenal adenoma (followed by Endocrinology), chronic HFpEF, aortic stenosis, OSA, ESRD (MWF), history of TIA's, history of recurrent chest pain  (cath in 12/2018 showing normal coronary arteries) presenting with left hand pain and blisters.   Assessment & Plan:   Principal Problem:   Abscess of left hand Active Problems:   Left hand pain  Acute Metabolic Encephalopathy Unclear cause, related to opiates (fentanyl  from ~9 am) vs sepsis or other cause Follow lactic acid, VBG, ammonia, b12, folate Delirium precautions Additional w/u if not improving  Skin Soft Tissue Infection - with encephalopathy above, could be sepsis, follow lactic acid - will discuss with Kathryn Beck  - CT with blistering and marked subcutaneous edema with stranding along the dorsal hand extending to the underlying extensor digitorum tendons with numerous scattered foci of soft tissue gas - continue abx with vanc, ceftriaxone , flagyl  - Follow-up blood cultures NGx3 - now s/p L hand and forearm I&D, placement of drain 8/9 - follow surgical culture -> moderate staph aureus (oxacillin sensitive) - will c/s ID for final recs - Judicious opioids   Paroxysmal atrial fibrillation  RVR - Currently in sinus rhythm - Continue metoprolol  - amiodarone  with soft BP, transition to heparin  with inability to reliably take PO right now   ESRD  Hyperkalemia - volume per renal  - on T/Th/S/Su torsemide  - hyperkalemia management per renal    Essential  hypertension - Continue metoprolol  succinate (non dialysis days) and amlodipine    Anxiety/depression - Cymbalta , sertraline , buspar    Diabetes mellitus type 2 with hyperglycemia - NovoLog  sliding scale - Reduced dose of semaglutide  - 10/27/2023 hemoglobin A1c 6.7   PAD - Status post bilateral BKA - s/p Stent, left superficial femoral and popliteal artery 07/21/22 (Kathryn Beck) - Continue aspirin , heparin  with encephalopathy     DVT prophylaxis: heparin  Code Status: full Family Communication: none Disposition:   Status is: Inpatient Remains inpatient appropriate because: need for continued inpatient care   Consultants:  Renal orthopedics  Procedures:   8/9 Left hand and forearm irrigation and debridement, placement of drain with loose closure  Antimicrobials:  Anti-infectives (From admission, onward)    Start     Dose/Rate Route Frequency Ordered Stop   11/01/23 1600  vancomycin  (VANCOREADY) IVPB 750 mg/150 mL        750 mg 150 mL/hr over 60 Minutes Intravenous Every M-W-F (Hemodialysis) 10/29/23 1337     10/31/23 1600  cefTRIAXone  (ROCEPHIN ) 2 g in sodium chloride  0.9 % 100 mL IVPB       Note to Pharmacy: Retime as appropriate   2 g 200 mL/hr over 30 Minutes Intravenous Every 24 hours 10/31/23 1504     10/30/23 1445  metroNIDAZOLE  (FLAGYL ) tablet 500 mg        500 mg Oral Every 12 hours 10/30/23 1356     10/30/23 1200  ceFEPIme  (MAXIPIME ) 1 g in sodium chloride  0.9 % 100 mL IVPB  Status:  Discontinued        1 g 200 mL/hr over  30 Minutes Intravenous Every 24 hours 10/29/23 1337 10/31/23 1504   10/29/23 1130  vancomycin  (VANCOREADY) IVPB 1500 mg/300 mL        1,500 mg 150 mL/hr over 120 Minutes Intravenous  Once 10/29/23 1110 10/29/23 1336   10/29/23 1130  ceFEPIme  (MAXIPIME ) 2 g in sodium chloride  0.9 % 100 mL IVPB        2 g 200 mL/hr over 30 Minutes Intravenous  Once 10/29/23 1117 10/29/23 1310       Subjective: Lethargic She doesn't know why she's so  sleepy  Objective: Vitals:   11/01/23 1100 11/01/23 1130 11/01/23 1145 11/01/23 1232  BP: (!) 168/94 114/64  99/62  Pulse: (!) 45 (!) 43  (!) 152  Resp: 11 10  16   Temp:  97.8 F (36.6 C)  97.9 F (36.6 C)  TempSrc:  Oral  Oral  SpO2: 94% 100%  100%  Weight:   74.5 kg   Height:        Intake/Output Summary (Last 24 hours) at 11/01/2023 1238 Last data filed at 11/01/2023 1130 Gross per 24 hour  Intake --  Output 3000 ml  Net -3000 ml   Filed Weights   10/30/23 0706 11/01/23 0746 11/01/23 1145  Weight: 76 kg 74.1 kg 74.5 kg    Examination:  General: No acute distress. Cardiovascular: tachy, irregularly irregular Lungs: unlabored Abdomen: Soft, nontender, nondistended  Neurological: lethargic, moving all extremities Skin: Warm and dry. No rashes or lesions. Extremities: L arm in splint, bilateral BKA    Data Reviewed: I have personally reviewed following labs and imaging studies  CBC: Recent Labs  Lab 10/27/23 1108 10/28/23 0444 10/29/23 1029 10/30/23 0432 10/31/23 0439 11/01/23 0537  WBC 16.9* 15.9* 19.3* 18.8* 22.5* 20.1*  NEUTROABS 14.1*  --  16.5*  --  19.5* 16.9*  HGB 11.1* 9.8* 9.9* 9.7* 9.6* 9.7*  HCT 35.6* 32.0* 32.8* 30.5* 30.0* 30.1*  MCV 79.5* 80.0 78.3* 76.8* 75.8* 75.3*  PLT 329 311 381 395 417* 430*    Basic Metabolic Panel: Recent Labs  Lab 10/27/23 1108 10/29/23 1029 10/30/23 0432 10/31/23 0439 11/01/23 0537  NA 138 140 142 142 141  K 4.0 4.1 4.4 5.4* 5.1  CL 95* 98 105 104 103  CO2 24 25 22 22 22   GLUCOSE 105* 95 95 120* 122*  BUN 26* 34* 43* 58* 75*  CREATININE 3.53* 3.99* 4.89* 5.84* 6.75*  CALCIUM  8.9 8.4* 8.6* 9.3 9.1  MG 2.0  --   --   --  2.1  PHOS  --   --  5.3* 6.5* 6.4*    GFR: Estimated Creatinine Clearance: 8.2 mL/min (Kathryn Beck) (by C-G formula based on SCr of 6.75 mg/dL (H)).  Liver Function Tests: Recent Labs  Lab 10/27/23 1108 10/29/23 1029 10/30/23 0432 10/31/23 0439 11/01/23 0537  AST 45* 22  --   --  18   ALT 44 33  --   --  25  ALKPHOS 130* 136*  --   --  111  BILITOT 1.2 0.6  --   --  0.9  PROT 7.1 6.8  --   --  5.9*  ALBUMIN  3.2* 2.7* 2.2* 2.1* 1.9*    CBG: Recent Labs  Lab 10/30/23 2132 10/31/23 0739 10/31/23 1229 10/31/23 1703 10/31/23 2101  GLUCAP 219* 139* 184* 202* 126*     Recent Results (from the past 240 hours)  MRSA Next Gen by PCR, Nasal     Status: None   Collection Time: 10/27/23  1:43 PM   Specimen: Nasal Mucosa; Nasal Swab  Result Value Ref Range Status   MRSA by PCR Next Gen NOT DETECTED NOT DETECTED Final    Comment: (NOTE) The GeneXpert MRSA Assay (FDA approved for NASAL specimens only), is one component of Jonte Shiller comprehensive MRSA colonization surveillance program. It is not intended to diagnose MRSA infection nor to guide or monitor treatment for MRSA infections. Test performance is not FDA approved in patients less than 23 years old. Performed at [facility], [address] [place], [place] [address]   Culture, blood (Routine X 2) w Reflex to ID Panel     Status: None   Collection Time: 10/27/23  6:59 PM   Specimen: BLOOD  Result Value Ref Range Status   Specimen Description BLOOD BLOOD RIGHT HAND  Final   Special Requests   Final    AEROBIC BOTTLE ONLY Blood Culture results may not be optimal due to an inadequate volume of blood received in culture bottles   Culture   Final    NO GROWTH 5 DAYS Performed at [facility], [address] [place], [place] [address]    Report Status 11/01/2023 FINAL  Final  Culture, blood (Routine X 2) w Reflex to ID Panel     Status: None   Collection Time: 10/27/23  6:59 PM   Specimen: BLOOD  Result Value Ref Range Status   Specimen Description BLOOD BLOOD RIGHT HAND  Final   Special Requests   Final    AEROBIC BOTTLE ONLY Blood Culture results may not be optimal due to an inadequate volume of blood received in culture bottles   Culture   Final    NO GROWTH 5 DAYS Performed at [facility], [address]  [place], [place] [address]    Report Status 11/01/2023 FINAL  Final  Blood culture (routine x 2)     Status: None (Preliminary result)   Collection Time: 10/29/23 10:29 AM   Specimen: BLOOD  Result Value Ref Range Status   Specimen Description BLOOD BLOOD RIGHT ARM RAC  Final   Special Requests   Final    BOTTLES DRAWN AEROBIC AND ANAEROBIC Blood Culture adequate volume   Culture   Final    NO GROWTH 3 DAYS Performed at [facility], [address], [place], [place] [address]    Report Status PENDING  Incomplete  Blood culture (routine x 2)     Status: None (Preliminary result)   Collection Time: 10/29/23 10:29 AM   Specimen: BLOOD  Result Value Ref Range Status   Specimen Description BLOOD BLOOD RIGHT ARM LOWER ARM  Final   Special Requests   Final    BOTTLES DRAWN AEROBIC AND ANAEROBIC Blood Culture adequate volume   Culture   Final    NO GROWTH 3 DAYS Performed at [facility], [address], [place], [place] [address]    Report Status PENDING  Incomplete  Surgical PCR screen     Status: Abnormal   Collection Time: 10/30/23  5:53 AM   Specimen: Nasal Mucosa; Nasal Swab  Result Value Ref Range Status   MRSA, PCR NEGATIVE NEGATIVE Final   Staphylococcus aureus POSITIVE (Arieal Cuoco) NEGATIVE Final    Comment: (NOTE) The Xpert SA Assay (FDA approved for NASAL specimens in patients 59 years of age and older), is one component of Korde Jeppsen comprehensive surveillance program. It is not intended to diagnose infection nor to guide or monitor treatment. Performed at [facility], [address], [place],  [place] [address]   Aerobic/Anaerobic Culture w Gram Stain (surgical/deep wound)     Status: None (Preliminary result)   Collection Time: 10/30/23  8:09 AM   Specimen: Wound; Body Fluid  Result Value Ref Range Status   Specimen Description WOUND  Final   Special Requests LEFT HAND FLUID COLLECTION  Final   Gram Stain   Final    RARE WBC PRESENT, PREDOMINANTLY PMN ABUNDANT GRAM POSITIVE  COCCI Performed at [facility], [address] [place], [place] [address]    Culture MODERATE STAPHYLOCOCCUS AUREUS  Final   Report Status PENDING  Incomplete   Organism ID, Bacteria STAPHYLOCOCCUS AUREUS  Final      Susceptibility   Staphylococcus aureus - MIC*    CIPROFLOXACIN  >=8 RESISTANT Resistant     ERYTHROMYCIN >=8 RESISTANT Resistant     GENTAMICIN  >=16 RESISTANT Resistant     OXACILLIN 0.5 SENSITIVE Sensitive     TETRACYCLINE >=16 RESISTANT Resistant     VANCOMYCIN  <=0.5 SENSITIVE Sensitive     TRIMETH /SULFA  >=320 RESISTANT Resistant     CLINDAMYCIN  >=8 RESISTANT Resistant     RIFAMPIN <=0.5 SENSITIVE Sensitive     Inducible Clindamycin  NEGATIVE Sensitive     LINEZOLID 2 SENSITIVE Sensitive     * MODERATE STAPHYLOCOCCUS AUREUS         Radiology Studies: No results found.       Scheduled Meds:  (feeding supplement) PROSource Plus  30 mL Oral BID BM   acetaminophen   1,000 mg Oral Q8H   apixaban   5 mg Oral BID   aspirin   81 mg Oral Daily   busPIRone   5 mg Oral BID   calcitRIOL   1 mcg Oral Q M,W,F-HD   Chlorhexidine  Gluconate Cloth  6 each Topical Q0600   cinacalcet   90 mg Oral Q M,W,F-HD   [START ON 11/05/2023] darbepoetin (ARANESP ) injection - DIALYSIS  40 mcg Subcutaneous Q Fri-1800   DULoxetine   60 mg Oral Daily   ezetimibe   10 mg Oral Daily   insulin  aspart  0-5 Units Subcutaneous QHS   insulin  aspart  0-9 Units Subcutaneous TID WC   metoprolol  succinate  50 mg Oral Q T,Th,Sa-HD   metroNIDAZOLE   500 mg Oral Q12H   multivitamin  1 tablet Oral Daily   mupirocin  ointment  1 Application Nasal BID   polyethylene glycol  17 g Oral BID   sertraline   50 mg Oral Daily   sucroferric oxyhydroxide  1,000 mg Oral TID WC   torsemide   100 mg Oral Q T,Th,S,Su   Continuous Infusions:  cefTRIAXone  (ROCEPHIN )  IV 2 g (10/31/23 1647)   vancomycin  750 mg (11/01/23 1031)     LOS: 3 days    Time spent: over 30 min     Meliton Monte, MD [facility]   To contact the attending provider between 7A-7P or the covering provider during after hours 7P-7A, please log into the web site [email or web address] and access using universal Williams password for that web site. If you do not have the password, please call the hospital operator.  11/01/2023, 12:38 PM

## 2023-11-01 NOTE — Progress Notes (Signed)
 MEWS Progress Note  Patient Details Name: FLEETA KUNDE MRN: [id number] DOB: Feb 07, 1956 Today's Date: 11/01/2023   MEWS Flowsheet Documentation:  Assess: MEWS Score Temp: 97.9 F (36.6 C) BP: 99/62 MAP (mmHg): 75 Pulse Rate: (!) 152 ECG Heart Rate: (!) 152 Resp: 16 Level of Consciousness: Alert SpO2: 100 % O2 Device: Room Air O2 Flow Rate (L/min): 5 L/min Assess: MEWS Score MEWS Temp: 0 MEWS Systolic: 1 MEWS Pulse: 3 MEWS RR: 0 MEWS LOC: 0 MEWS Score: 4 MEWS Score Color: Red Assess: SIRS CRITERIA SIRS Temperature : 0 SIRS Respirations : 0 SIRS Pulse: 1 SIRS WBC: 1 SIRS Score Sum : 2 SIRS Temperature : 0 SIRS Pulse: 1 SIRS Respirations : 0 SIRS WBC: 1 SIRS Score Sum : 2 Assess: if the MEWS score is Yellow or Red Were vital signs accurate and taken at a resting state?: Yes Does the patient meet 2 or more of the SIRS criteria?: Yes Does the patient have a confirmed or suspected source of infection?: Yes MEWS guidelines implemented : Yes, red Treat MEWS Interventions: Considered administering scheduled or prn medications/treatments as ordered Take Vital Signs Increase Vital Sign Frequency : Red: Q1hr x2, continue Q4hrs until patient remains green for 12hrs Escalate MEWS: Escalate: Red: Discuss with charge nurse and notify provider. Consider notifying RRT. If remains red for 2 hours consider need for higher level of care Notify: Charge Nurse/RN Name of Charge Nurse/RN Notified: Oncologist Provider Notification Provider Name/Title: Dr. Perri Date Provider Notified: 11/01/23 Time Provider Notified: 1232 Method of Notification: Face-to-face Notification Reason: Change in status Provider response: At bedside Date of Provider Response: 11/01/23 Time of Provider Response: 1232      Suzy Corean NOVAK 11/01/2023, 12:43 PM

## 2023-11-01 NOTE — Progress Notes (Signed)
 Kathryn Beck Progress Note   Subjective:    Patient seen on HD. She was very slow to answer my questions and when I told her that we would remove 3L today in HD, she started crying. She would not explain what upset her. No dyspnea or CP reported. Next HD 8/13  Objective Vitals:   11/01/23 0801 11/01/23 0830 11/01/23 0900 11/01/23 0930  BP: (!) 142/63 (!) 147/69 (!) 148/68 (!) 148/70  Pulse: 77 74  84  Resp: 15 15 14 14   Temp:      TempSrc:      SpO2: 97% 100% 100% 100%  Weight:      Height:       Physical Exam General: Awake; NAD Heart: S1 and S2; RRR; No murmurs Lungs: Clear throughout Abdomen: Soft and non-tender Extremities: B/l BKAs; No edema b/l stumps Dialysis Access:  L AVF   Filed Weights   10/29/23 0901 10/30/23 0706 11/01/23 0746  Weight: 76 kg 76 kg 74.1 kg   No intake or output data in the 24 hours ending 11/01/23 1019   Additional Objective Labs: Basic Metabolic Panel: Recent Labs  Lab 10/30/23 0432 10/31/23 0439 11/01/23 0537  NA 142 142 141  K 4.4 5.4* 5.1  CL 105 104 103  CO2 22 22 22   GLUCOSE 95 120* 122*  BUN 43* 58* 75*  CREATININE 4.89* 5.84* 6.75*  CALCIUM  8.6* 9.3 9.1  PHOS 5.3* 6.5* 6.4*   Liver Function Tests: Recent Labs  Lab 10/27/23 1108 10/29/23 1029 10/30/23 0432 10/31/23 0439 11/01/23 0537  AST 45* 22  --   --  18  ALT 44 33  --   --  25  ALKPHOS 130* 136*  --   --  111  BILITOT 1.2 0.6  --   --  0.9  PROT 7.1 6.8  --   --  5.9*  ALBUMIN  3.2* 2.7* 2.2* 2.1* 1.9*   No results for input(s): LIPASE, AMYLASE in the last 168 hours. CBC: Recent Labs  Lab 10/28/23 0444 10/29/23 1029 10/30/23 0432 10/31/23 0439 11/01/23 0537  WBC 15.9* 19.3* 18.8* 22.5* 20.1*  NEUTROABS  --  16.5*  --  19.5* 16.9*  HGB 9.8* 9.9* 9.7* 9.6* 9.7*  HCT 32.0* 32.8* 30.5* 30.0* 30.1*  MCV 80.0 78.3* 76.8* 75.8* 75.3*  PLT 311 381 395 417* 430*    Medications:  cefTRIAXone  (ROCEPHIN )  IV 2 g (10/31/23 1647)    vancomycin       acetaminophen   1,000 mg Oral Q8H   apixaban   5 mg Oral BID   aspirin   81 mg Oral Daily   busPIRone   5 mg Oral BID   calcitRIOL   1 mcg Oral Q M,W,F-HD   Chlorhexidine  Gluconate Cloth  6 each Topical Q0600   cinacalcet   90 mg Oral Q M,W,F-HD   [START ON 11/05/2023] darbepoetin (ARANESP ) injection - DIALYSIS  40 mcg Subcutaneous Q Fri-1800   DULoxetine   60 mg Oral Daily   ezetimibe   10 mg Oral Daily   insulin  aspart  0-5 Units Subcutaneous QHS   insulin  aspart  0-9 Units Subcutaneous TID WC   metoprolol  succinate  50 mg Oral Q T,Th,Sa-HD   metroNIDAZOLE   500 mg Oral Q12H   multivitamin  1 tablet Oral Daily   mupirocin  ointment  1 Application Nasal BID   polyethylene glycol  17 g Oral BID   sertraline   50 mg Oral Daily   sucroferric oxyhydroxide  1,000 mg Oral TID WC   torsemide   100 mg Oral Q T,Th,S,Su    Dialysis Orders: [facility] - MWF 3.5hrs BFR 400 DFR 500  2K/2.5Ca  EDW 73.5kg Heparin  bolus 1000 units Calcitriol  with HD - last dose 10/29/23 Sensipar  90mg  with HD - last dose 10/29/23 Micera 50mcg Q2wks - last dose 10/18/23 [name]  1000mg  TID with meals  Assessment/Plan: Left hand pain with swelling - L hand CT showed concern for soft tissue infection. S/p L hand I&D with drain placement, per Vanc, Cefepime , and Flagyl . Blood culture resulted with G+ with moderate staph aureus. On IV antibiotics for now.  ESRD -  on HD MWF.  Plan for next HD 8/11 per her usual schedule. K+ now 5.4-ordered Lokelma  10GM X 1. Hypertension/volume  - Bps acceptable. Continue home medications. Anemia of CKD - Hgb 9.7. Next ESA due 8/11-will order. Secondary Hyperparathyroidism -  Added calcitriol  and continue Sensipar . On binders. Nutrition - Renal diet with fluid restriction. Add on protein supplements today.   Belvie Och, NP [facility] 11/01/2023,10:19 AM  LOS: 3 days

## 2023-11-01 NOTE — Procedures (Signed)
 I was present at this dialysis session. I have reviewed the session itself and made appropriate changes.   Vital signs in last 24 hours:  Temp:  [98 F (36.7 C)-98.5 F (36.9 C)] 98.5 F (36.9 C) (08/11 0745) Pulse Rate:  [71-81] 77 (08/11 0801) Resp:  [15-20] 15 (08/11 0801) BP: (133-156)/(63-72) 142/63 (08/11 0801) SpO2:  [97 %-100 %] 97 % (08/11 0801) Weight:  [74.1 kg] 74.1 kg (08/11 0746) Weight change:  Filed Weights   10/29/23 0901 10/30/23 0706 11/01/23 0746  Weight: 76 kg 76 kg 74.1 kg    Recent Labs  Lab 11/01/23 0537  NA 141  K 5.1  CL 103  CO2 22  GLUCOSE 122*  BUN 75*  CREATININE 6.75*  CALCIUM  9.1  PHOS 6.4*    Recent Labs  Lab 10/29/23 1029 10/30/23 0432 10/31/23 0439 11/01/23 0537  WBC 19.3* 18.8* 22.5* 20.1*  NEUTROABS 16.5*  --  19.5* 16.9*  HGB 9.9* 9.7* 9.6* 9.7*  HCT 32.8* 30.5* 30.0* 30.1*  MCV 78.3* 76.8* 75.8* 75.3*  PLT 381 395 417* 430*    Scheduled Meds:  acetaminophen   1,000 mg Oral Q8H   apixaban   5 mg Oral BID   aspirin   81 mg Oral Daily   busPIRone   5 mg Oral BID   calcitRIOL   1 mcg Oral Q M,W,F-HD   Chlorhexidine  Gluconate Cloth  6 each Topical Q0600   cinacalcet   90 mg Oral Q M,W,F-HD   [START ON 11/05/2023] darbepoetin (ARANESP ) injection - DIALYSIS  40 mcg Subcutaneous Q Fri-1800   DULoxetine   60 mg Oral Daily   ezetimibe   10 mg Oral Daily   insulin  aspart  0-5 Units Subcutaneous QHS   insulin  aspart  0-9 Units Subcutaneous TID WC   metoprolol  succinate  50 mg Oral Q T,Th,Sa-HD   metroNIDAZOLE   500 mg Oral Q12H   multivitamin  1 tablet Oral Daily   mupirocin  ointment  1 Application Nasal BID   polyethylene glycol  17 g Oral BID   sertraline   50 mg Oral Daily   sucroferric oxyhydroxide  1,000 mg Oral TID WC   torsemide   100 mg Oral Q T,Th,S,Su   Continuous Infusions:  cefTRIAXone  (ROCEPHIN )  IV 2 g (10/31/23 1647)   vancomycin      PRN Meds:.alteplase , fentaNYL  (SUBLIMAZE ) injection, heparin , lidocaine  (PF),  lidocaine -prilocaine , ondansetron  **OR** ondansetron  (ZOFRAN ) IV, pentafluoroprop-tetrafluoroeth   Kathryn DELENA Sellar,  MD 11/01/2023, 8:28 AM

## 2023-11-01 NOTE — Progress Notes (Signed)
 Pt receives OP HD at [facility], MWF 0700 chair time. Will assist as needed.   Lavanda Estephania Licciardi Dialysis Navigator [phone]

## 2023-11-01 NOTE — Progress Notes (Signed)
 PHARMACY - ANTICOAGULATION CONSULT NOTE  Pharmacy Consult for Heparin    Indication: atrial fibrillation  Allergies  Allergen Reactions   Ace Inhibitors Anaphylaxis and Swelling   Penicillins Itching and Swelling    Has tolerated cefazolin  on multiple occasions    Statins Other (See Comments)    elevated LFT's     Albuterol  Swelling    Patient Measurements: Height: 5' 5 (165.1 cm) Weight: 74.5 kg (164 lb 3.9 oz) IBW/kg (Calculated) : 57 HEPARIN  DW (KG): 72.7  Vital Signs: Temp: 98.3 F (36.8 C) (08/11 1342) Temp Source: Oral (08/11 1342) BP: 107/66 (08/11 1342) Pulse Rate: 93 (08/11 1342)  Labs: Recent Labs    10/30/23 0432 10/31/23 0439 11/01/23 0537  HGB 9.7* 9.6* 9.7*  HCT 30.5* 30.0* 30.1*  PLT 395 417* 430*  CREATININE 4.89* 5.84* 6.75*    Estimated Creatinine Clearance: 8.2 mL/min (A) (by C-G formula based on SCr of 6.75 mg/dL (H)).   Medical History: Past Medical History:  Diagnosis Date   Acid reflux    Amputated left leg (HCC)    bilateral BKA   Anemia    Arthritis    Axillary masses    Soft tissue - status post excision   Back pain    CHF (congestive heart failure) (HCC)    COVID-19 virus infection 04/06/2019   Depression    End-stage renal disease (HCC)    M/W/F dialysis   Essential hypertension    Headache    years ago   History of blood transfusion    History of cardiac catheterization    Normal coronary arteries October 2020   History of claustrophobia    History of pneumonia 2019   Hypoxia 04/03/2019   Memory loss    Mixed hyperlipidemia    Obesity    Pancreatitis    Peritoneal dialysis catheter in place [facility])    Pneumonia due to COVID-19 virus 04/02/2019   Sleep apnea    Noncompliant with CPAP   Stroke (HCC)    mini stroke   Type 2 diabetes mellitus (HCC)     Medications:  Infusions:   amiodarone  60 mg/hr (11/01/23 1314)   Followed by   amiodarone      cefTRIAXone  (ROCEPHIN )  IV 2 g (10/31/23 1647)   heparin       vancomycin  750 mg (11/01/23 1031)    Assessment: Pharmacy consulted to dose heparin  for Afib due to patient difficulty taking PO apixaban . Last dose 8/10 1643. Given recent apixaban  dose, likely first heparin  level elevated, plan to dose of aPTT until correlated levels.  Goal of Therapy:  Heparin  level 0.3-0.7 units/ml aPTT 66-102 seconds Monitor platelets by anticoagulation protocol: Yes   Plan:  Start heparin  infusion at 1000 units/hr Check anti-Xa level in 8 hours and daily while on heparin  Continue to monitor H&H and platelets  Larraine CHRISTELLA Brazier 11/01/2023,2:00 PM

## 2023-11-01 NOTE — Progress Notes (Signed)
   Kathryn Beck - 68 y.o. female MRN [id number]  Date of birth: 03-Jun-1955    Subjective:  Resting comfortably this morning, does have persistent pain of the left hand.  Dressing change was performed this morning by nursing with minimal drainage.  Objective:   VITALS:   Vitals:   11/01/23 0745 11/01/23 0746 11/01/23 0801 11/01/23 0830  BP: (!) 149/65  (!) 142/63 (!) 147/69  Pulse: 79  77 74  Resp: 17  15 15   Temp: 98.5 F (36.9 C)     TempSrc: Oral     SpO2: 99%  97% 100%  Weight:  74.1 kg    Height:        Gen: Awake and alert Left upper extremity: - Dressing in place, remains clean dry and intact, digital range of motion remains limited secondary to pain particular with flexion - Sensation is intact to light touch - No significant pain up the forearm   Lab Results  Component Value Date   WBC 20.1 (H) 11/01/2023   HGB 9.7 (L) 11/01/2023   HCT 30.1 (L) 11/01/2023   MCV 75.3 (L) 11/01/2023   PLT 430 (H) 11/01/2023     Assessment/Plan:  2 Days Post-Op left upper extremity incision and drainage for abscess versus fluid collection in the setting of 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

## 2023-11-02 ENCOUNTER — Encounter: Admitting: Family

## 2023-11-02 DIAGNOSIS — B9561 Methicillin susceptible Staphylococcus aureus infection as the cause of diseases classified elsewhere: Secondary | ICD-10-CM | POA: Diagnosis not present

## 2023-11-02 DIAGNOSIS — L02512 Cutaneous abscess of left hand: Secondary | ICD-10-CM | POA: Diagnosis not present

## 2023-11-02 DIAGNOSIS — D72829 Elevated white blood cell count, unspecified: Secondary | ICD-10-CM | POA: Diagnosis not present

## 2023-11-02 LAB — COMPREHENSIVE METABOLIC PANEL WITH GFR
ALT: 25 U/L (ref 0–44)
AST: 18 U/L (ref 15–41)
Albumin: 1.9 g/dL — ABNORMAL LOW (ref 3.5–5.0)
Alkaline Phosphatase: 118 U/L (ref 38–126)
Anion gap: 17 — ABNORMAL HIGH (ref 5–15)
BUN: 45 mg/dL — ABNORMAL HIGH (ref 8–23)
CO2: 24 mmol/L (ref 22–32)
Calcium: 8.5 mg/dL — ABNORMAL LOW (ref 8.9–10.3)
Chloride: 97 mmol/L — ABNORMAL LOW (ref 98–111)
Creatinine, Ser: 4.42 mg/dL — ABNORMAL HIGH (ref 0.44–1.00)
GFR, Estimated: 10 mL/min — ABNORMAL LOW (ref >60)
Glucose, Bld: 146 mg/dL — ABNORMAL HIGH (ref 70–99)
Potassium: 4.1 mmol/L (ref 3.5–5.1)
Sodium: 138 mmol/L (ref 135–145)
Total Bilirubin: 0.9 mg/dL (ref 0.0–1.2)
Total Protein: 6 g/dL — ABNORMAL LOW (ref 6.5–8.1)

## 2023-11-02 LAB — CBC
HCT: 31.3 % — ABNORMAL LOW (ref 36.0–46.0)
Hemoglobin: 10.1 g/dL — ABNORMAL LOW (ref 12.0–15.0)
MCH: 24.2 pg — ABNORMAL LOW (ref 26.0–34.0)
MCHC: 32.3 g/dL (ref 30.0–36.0)
MCV: 75.1 fL — ABNORMAL LOW (ref 80.0–100.0)
Platelets: 477 K/uL — ABNORMAL HIGH (ref 150–400)
RBC: 4.17 MIL/uL (ref 3.87–5.11)
RDW: 18.3 % — ABNORMAL HIGH (ref 11.5–15.5)
WBC: 18.3 K/uL — ABNORMAL HIGH (ref 4.0–10.5)
nRBC: 0.7 % — ABNORMAL HIGH (ref 0.0–0.2)

## 2023-11-02 LAB — GLUCOSE, CAPILLARY
Glucose-Capillary: 109 mg/dL — ABNORMAL HIGH (ref 70–99)
Glucose-Capillary: 136 mg/dL — ABNORMAL HIGH (ref 70–99)
Glucose-Capillary: 196 mg/dL — ABNORMAL HIGH (ref 70–99)
Glucose-Capillary: 239 mg/dL — ABNORMAL HIGH (ref 70–99)

## 2023-11-02 LAB — HEPARIN LEVEL (UNFRACTIONATED): Heparin Unfractionated: 0.56 [IU]/mL (ref 0.30–0.70)

## 2023-11-02 LAB — APTT: aPTT: 46 s — ABNORMAL HIGH (ref 24–36)

## 2023-11-02 LAB — HEPATITIS B SURFACE ANTIBODY, QUANTITATIVE: Hep B S AB Quant (Post): 246 m[IU]/mL

## 2023-11-02 LAB — MAGNESIUM: Magnesium: 1.9 mg/dL (ref 1.7–2.4)

## 2023-11-02 LAB — PHOSPHORUS: Phosphorus: 5.2 mg/dL — ABNORMAL HIGH (ref 2.5–4.6)

## 2023-11-02 MED ORDER — CHLORHEXIDINE GLUCONATE CLOTH 2 % EX PADS
6.0000 | MEDICATED_PAD | Freq: Every day | CUTANEOUS | Status: DC
  Administered 2023-11-02 – 2023-11-04 (×5): 6 via TOPICAL

## 2023-11-02 MED ORDER — CEFAZOLIN SODIUM-DEXTROSE 2-4 GM/100ML-% IV SOLN
2.0000 g | Freq: Once | INTRAVENOUS | Status: AC
  Administered 2023-11-02 (×2): 2 g via INTRAVENOUS
  Filled 2023-11-02: qty 100

## 2023-11-02 MED ORDER — OXYCODONE HCL 5 MG PO TABS
2.5000 mg | ORAL_TABLET | ORAL | Status: DC | PRN
  Administered 2023-11-02 – 2023-11-05 (×7): 2.5 mg via ORAL
  Filled 2023-11-02 (×5): qty 1

## 2023-11-02 MED ORDER — APIXABAN 5 MG PO TABS
5.0000 mg | ORAL_TABLET | Freq: Two times a day (BID) | ORAL | Status: DC
  Administered 2023-11-02 – 2023-11-05 (×11): 5 mg via ORAL
  Filled 2023-11-02 (×2): qty 1
  Filled 2023-11-02: qty 2
  Filled 2023-11-02 (×4): qty 1

## 2023-11-02 MED ORDER — CEFAZOLIN SODIUM-DEXTROSE 2-4 GM/100ML-% IV SOLN
2.0000 g | INTRAVENOUS | Status: DC
  Administered 2023-11-03 (×2): 2 g via INTRAVENOUS
  Filled 2023-11-02 (×2): qty 100

## 2023-11-02 NOTE — Plan of Care (Signed)
 HAND SURGERY UPDATE NOTE:  Reviewed images in chart from daily dressing change performed yesterday, minimal drainage.  Drain will be left in place currently.  Continue with daily dressing changes per nursing.  Encourage elevation of the hand and range of motion exercises with occupational therapy for digital range of motion.  Kathryn Reach, MD Hand Surgery, [place]

## 2023-11-02 NOTE — Consult Note (Addendum)
 [facility]   Total days of antibiotics  Cefazolin  starting today on 8/12  Day 4 metronidazole - d/c today  2 Days of cefepime  8/9-8/10 2 days of ceftriaxone  2g 8/10-8/11 1 day of vancomycin  8/11     Reason for Consult:MSSA left hand wound     Referring Physician: Dr. Perri Raddle   Principal Problem:   Abscess of left hand Active Problems:   Left hand pain    HPI: Kathryn Beck is a 68 y.o. female with PMH PAD s/p bilateral BKA, ESRD on MWF dialysis,afib on apixaban , HTN, Type 2 DM, HFpEF, TIA presents with left hand pain with fluid filled blisters.  Patient's family was at bedside. States that on July 9, patient underwent left hand middle finger amputation. Per chart review office visit with [facility] does note well healed left middle finger amputation and sutures were removed at this time on 10/15/2023.Patient noted that the fluid filled blisters started a 3-4 weeks after surgery.  Patient's daughter notes that she thought one of the surgeons had noted that amputation was not healing well. Patient also gets dialysis access on left side AVF but says that she does not note any infection from there or any complications. Patient notes that she usually feels nauseous and has episodes of low blood pressures, especially during dialysis. During one of her dialysis sessions on 8/6 , patient was found to be in Afib with RVR. Per chart review, at time of that hospital presentation from 8/6-8/7, seems that stitches were intact and  there was significant swelling and tenderness to left hand. WBC count was 16.9 at that time. US  was ordered at that time to rule out abscess. Patient was discharged home on cefdinir  and doxycycline  on 8/7. When patient to dialysis on 8/8 she noticed more blistering of the left hand and had worsening pain during dialysis. She then presented to the ED after dialysis due to this pain. She was hemodynamically stable  and afebrile. Per H&P patient endorsed  subjective fevers and chills at that time. Patient today denied any fever/chills, cough or sputum production, but did endorse some chronic nausea and no vomiting.  Upon presentation to the ED further imaging was done in the form of xray and CT of left hand which showed blistering and marked subcutaneous edema along dorsal hand that spreads to the extensor digitorum tendons with scattered foci of soft tissue gas. This was concerning for soft tissue infection. Orthopedics was consulted and patient underwent left hand and forearm irrigation and debridement, with placement of a drain and loose closure. Notable purulence was noted during this procedure.  Patient denies any history of purulence or drainage from her hand. She does note that she has had pain, especially with flexion of her fingers on that hand in the last couple of weeks.   Past Medical History:  Diagnosis Date   Acid reflux    Amputated left leg (HCC)    bilateral BKA   Anemia    Arthritis    Axillary masses    Soft tissue - status post excision   Back pain    CHF (congestive heart failure) (HCC)    COVID-19 virus infection 04/06/2019   Depression    End-stage renal disease (HCC)    M/W/F dialysis   Essential hypertension    Headache    years ago   History of blood transfusion    History of cardiac catheterization    Normal coronary arteries October 2020  History of claustrophobia    History of pneumonia 2019   Hypoxia 04/03/2019   Memory loss    Mixed hyperlipidemia    Obesity    Pancreatitis    Peritoneal dialysis catheter in place [facility])    Pneumonia due to COVID-19 virus 04/02/2019   Sleep apnea    Noncompliant with CPAP   Stroke (HCC)    mini stroke   Type 2 diabetes mellitus (HCC)     Allergies:  Allergies  Allergen Reactions   Ace Inhibitors Anaphylaxis and Swelling   Penicillins Itching and Swelling    Has tolerated cefazolin  on multiple occasions    Statins Other (See Comments)    elevated LFT's      Albuterol  Swelling    Current antibiotics:   MEDICATIONS:  (feeding supplement) PROSource Plus  30 mL Oral BID BM   acetaminophen   1,000 mg Oral Q8H   apixaban   5 mg Oral BID   aspirin   81 mg Oral Daily   busPIRone   5 mg Oral BID   calcitRIOL   1 mcg Oral Q M,W,F-HD   Chlorhexidine  Gluconate Cloth  6 each Topical Q0600   Chlorhexidine  Gluconate Cloth  6 each Topical Q0600   cinacalcet   90 mg Oral Q M,W,F-HD   [START ON 11/05/2023] darbepoetin (ARANESP ) injection - DIALYSIS  40 mcg Subcutaneous Q Fri-1800   DULoxetine   60 mg Oral Daily   ezetimibe   10 mg Oral Daily   insulin  aspart  0-5 Units Subcutaneous QHS   insulin  aspart  0-9 Units Subcutaneous TID WC   metoprolol  succinate  50 mg Oral Q T,Th,S,Su   multivitamin  1 tablet Oral Daily   mupirocin  ointment  1 Application Nasal BID   polyethylene glycol  17 g Oral BID   sertraline   50 mg Oral Daily   sucroferric oxyhydroxide  1,000 mg Oral TID WC   torsemide   100 mg Oral Q T,Th,S,Su    Social History   Tobacco Use   Smoking status: Never    Passive exposure: Never   Smokeless tobacco: Never   Tobacco comments:    Verified by Daughter, [name]  Vaping Use   Vaping status: Never Used  Substance Use Topics   Alcohol use: No   Drug use: No    Family History  Problem Relation Age of Onset   Hypertension Father    Hypercholesterolemia Father    Arthritis Father    Hypertension Sister    Hypercholesterolemia Sister    Breast cancer Sister    Hypertension Sister    Colon cancer Neg Hx    Colon polyps Neg Hx     Review of Systems - Negative except as noted in HPI    OBJECTIVE: Temp:  [98.2 F (36.8 C)-98.9 F (37.2 C)] 98.9 F (37.2 C) (08/12 1146) Pulse Rate:  [72-73] 73 (08/12 1154) Resp:  [15-20] 16 (08/12 1146) BP: (136-159)/(64-74) 149/68 (08/12 1146) SpO2:  [96 %-99 %] 98 % (08/12 1154) Physical Exam Constitutional:      General: She is not in acute distress. Cardiovascular:     Rate and  Rhythm: Normal rate.     Heart sounds: No murmur heard.    Comments: Left upper extremity AVF with palpable thrill Pulmonary:     Effort: Pulmonary effort is normal. No respiratory distress.  Musculoskeletal:     Comments: Bandaged left hand. Bandage does not show any sign of purulence or excessive bleeding. Patient is able to move fingers slightly on the left side.  Fingers are warm to touch. Patient does have tenderness to palpation over dorsum of left hand even through the bandage. Do note some light brown with spots of red stains to pillow under patient's left hand  Bilateral BKA   Neurological:     Mental Status: She is alert.      LABS: Results for orders placed or performed during the hospital encounter of 10/29/23 (from the past 48 hours)  Glucose, capillary     Status: Abnormal   Collection Time: 10/31/23  5:03 PM  Result Value Ref Range   Glucose-Capillary 202 (H) 70 - 99 mg/dL    Comment: Glucose reference range applies only to samples taken after fasting for at least 8 hours.  Glucose, capillary     Status: Abnormal   Collection Time: 10/31/23  9:01 PM  Result Value Ref Range   Glucose-Capillary 126 (H) 70 - 99 mg/dL    Comment: Glucose reference range applies only to samples taken after fasting for at least 8 hours.  CBC with Differential/Platelet     Status: Abnormal   Collection Time: 11/01/23  5:37 AM  Result Value Ref Range   WBC 20.1 (H) 4.0 - 10.5 K/uL   RBC 4.00 3.87 - 5.11 MIL/uL   Hemoglobin 9.7 (L) 12.0 - 15.0 g/dL   HCT 69.8 (L) 63.9 - 53.9 %   MCV 75.3 (L) 80.0 - 100.0 fL   MCH 24.3 (L) 26.0 - 34.0 pg   MCHC 32.2 30.0 - 36.0 g/dL   RDW 82.3 (H) 88.4 - 84.4 %   Platelets 430 (H) 150 - 400 K/uL   nRBC 0.6 (H) 0.0 - 0.2 %   Neutrophils Relative % 84 %   Neutro Abs 16.9 (H) 1.7 - 7.7 K/uL   Lymphocytes Relative 6 %   Lymphs Abs 1.1 0.7 - 4.0 K/uL   Monocytes Relative 6 %   Monocytes Absolute 1.1 (H) 0.1 - 1.0 K/uL   Eosinophils Relative 0 %    Eosinophils Absolute 0.1 0.0 - 0.5 K/uL   Basophils Relative 0 %   Basophils Absolute 0.1 0.0 - 0.1 K/uL   Immature Granulocytes 4 %   Abs Immature Granulocytes 0.85 (H) 0.00 - 0.07 K/uL    Comment: Performed at [facility], [address] [place], [place] [address]  Comprehensive metabolic panel with GFR     Status: Abnormal   Collection Time: 11/01/23  5:37 AM  Result Value Ref Range   Sodium 141 135 - 145 mmol/L   Potassium 5.1 3.5 - 5.1 mmol/L   Chloride 103 98 - 111 mmol/L   CO2 22 22 - 32 mmol/L   Glucose, Bld 122 (H) 70 - 99 mg/dL    Comment: Glucose reference range applies only to samples taken after fasting for at least 8 hours.   BUN 75 (H) 8 - 23 mg/dL   Creatinine, Ser 3.24 (H) 0.44 - 1.00 mg/dL   Calcium  9.1 8.9 - 10.3 mg/dL   Total Protein 5.9 (L) 6.5 - 8.1 g/dL   Albumin  1.9 (L) 3.5 - 5.0 g/dL   AST 18 15 - 41 U/L   ALT 25 0 - 44 U/L   Alkaline Phosphatase 111 38 - 126 U/L   Total Bilirubin 0.9 0.0 - 1.2 mg/dL   GFR, Estimated 6 (L) >60 mL/min    Comment: (NOTE) Calculated using the CKD-EPI Creatinine Equation (2021)    Anion gap 16 (H) 5 - 15    Comment: Performed at  [facility], [address], [place], [place] [address]  Magnesium      Status: None   Collection Time: 11/01/23  5:37 AM  Result Value Ref Range   Magnesium  2.1 1.7 - 2.4 mg/dL    Comment: Performed at [facility], [address] [place], [place] [address]  Phosphorus     Status: Abnormal   Collection Time: 11/01/23  5:37 AM  Result Value Ref Range   Phosphorus 6.4 (H) 2.5 - 4.6 mg/dL    Comment: Performed at [facility], [address], [place], [place] [address]  Glucose, capillary     Status: None   Collection Time: 11/01/23 12:39 PM  Result Value Ref Range   Glucose-Capillary 81 70 - 99 mg/dL    Comment: Glucose reference range applies only to samples taken after fasting for at least 8 hours.  Lactic acid, plasma     Status: None   Collection Time: 11/01/23   1:14 PM  Result Value Ref Range   Lactic Acid, Venous 1.6 0.5 - 1.9 mmol/L    Comment: Performed at [facility], [address], [place], [place] [address]  Ammonia     Status: None   Collection Time: 11/01/23  1:14 PM  Result Value Ref Range   Ammonia 27 9 - 35 umol/L    Comment: Performed at [facility], [address], [place], [place] [address]  Vitamin B12     Status: Abnormal   Collection Time: 11/01/23  1:14 PM  Result Value Ref Range   Vitamin B-12 1,172 (H) 180 - 914 pg/mL    Comment: (NOTE) This assay is not validated for testing neonatal or myeloproliferative syndrome specimens for Vitamin B12 levels. Performed at [facility], [address] [place], [place] [address]   Folate     Status: None   Collection Time: 11/01/23  1:14 PM  Result Value Ref Range   Folate 32.4 >5.9 ng/mL    Comment: RESULT CONFIRMED BY MANUAL DILUTION Performed at [facility], [address] [place], [place] [address]   TSH     Status: None   Collection Time: 11/01/23  1:14 PM  Result Value Ref Range   TSH 0.393 0.350 - 4.500 uIU/mL    Comment: Performed by a 3rd Generation assay with a functional sensitivity of <=0.01 uIU/mL. Performed at [facility], [address] [place], [place] [address]   Blood gas, venous     Status: Abnormal   Collection Time: 11/01/23  2:44 PM  Result Value Ref Range   pH, Ven 7.44 (H) 7.25 - 7.43   pCO2, Ven 45 44 - 60 mmHg   pO2, Ven 49 (H) 32 - 45 mmHg   Bicarbonate 30.6 (H) 20.0 - 28.0 mmol/L   Acid-Base Excess 5.6 (H) 0.0 - 2.0 mmol/L   O2 Saturation 86.8 %   Patient temperature 36.8    Collection site RIGHT HAND    Drawn by [id number]     Comment: Performed at [facility], [address] [place], [place] [address]  Glucose, capillary     Status: Abnormal   Collection Time: 11/01/23  5:47 PM  Result Value Ref Range   Glucose-Capillary 107 (H) 70 - 99 mg/dL    Comment: Glucose reference range applies only to  samples taken after fasting for at least 8 hours.  Glucose, capillary     Status: Abnormal  Collection Time: 11/01/23  9:18 PM  Result Value Ref Range   Glucose-Capillary 119 (H) 70 - 99 mg/dL    Comment: Glucose reference range applies only to samples taken after fasting for at least 8 hours.  APTT     Status: Abnormal   Collection Time: 11/02/23 12:01 AM  Result Value Ref Range   aPTT 46 (H) 24 - 36 seconds    Comment:        IF BASELINE aPTT IS ELEVATED, SUGGEST PATIENT RISK ASSESSMENT BE USED TO DETERMINE APPROPRIATE ANTICOAGULANT THERAPY. Performed at [facility], [address] [place], [place] [address]   Heparin  level (unfractionated)     Status: None   Collection Time: 11/02/23 12:01 AM  Result Value Ref Range   Heparin  Unfractionated 0.56 0.30 - 0.70 IU/mL    Comment: (NOTE) The clinical reportable range upper limit is being lowered to >1.10 to align with the FDA approved guidance for the current laboratory assay.  If heparin  results are below expected values, and patient dosage has  been confirmed, suggest follow up testing of antithrombin III levels. Performed at [facility], [address], [place], [place] [address]   CBC     Status: Abnormal   Collection Time: 11/02/23  5:29 AM  Result Value Ref Range   WBC 18.3 (H) 4.0 - 10.5 K/uL   RBC 4.17 3.87 - 5.11 MIL/uL   Hemoglobin 10.1 (L) 12.0 - 15.0 g/dL   HCT 68.6 (L) 63.9 - 53.9 %   MCV 75.1 (L) 80.0 - 100.0 fL   MCH 24.2 (L) 26.0 - 34.0 pg   MCHC 32.3 30.0 - 36.0 g/dL   RDW 81.6 (H) 88.4 - 84.4 %   Platelets 477 (H) 150 - 400 K/uL   nRBC 0.7 (H) 0.0 - 0.2 %    Comment: Performed at [facility], [address], [place], [place] [address]  Comprehensive metabolic panel with GFR     Status: Abnormal   Collection Time: 11/02/23  5:29 AM  Result Value Ref Range   Sodium 138 135 - 145 mmol/L   Potassium 4.1 3.5 - 5.1 mmol/L   Chloride 97 (L) 98 - 111 mmol/L   CO2 24 22 - 32 mmol/L    Glucose, Bld 146 (H) 70 - 99 mg/dL    Comment: Glucose reference range applies only to samples taken after fasting for at least 8 hours.   BUN 45 (H) 8 - 23 mg/dL   Creatinine, Ser 5.57 (H) 0.44 - 1.00 mg/dL   Calcium  8.5 (L) 8.9 - 10.3 mg/dL   Total Protein 6.0 (L) 6.5 - 8.1 g/dL   Albumin  1.9 (L) 3.5 - 5.0 g/dL   AST 18 15 - 41 U/L   ALT 25 0 - 44 U/L   Alkaline Phosphatase 118 38 - 126 U/L   Total Bilirubin 0.9 0.0 - 1.2 mg/dL   GFR, Estimated 10 (L) >60 mL/min    Comment: (NOTE) Calculated using the CKD-EPI Creatinine Equation (2021)    Anion gap 17 (H) 5 - 15    Comment: Performed at [facility], [address], [place], [place] [address]  Magnesium      Status: None   Collection Time: 11/02/23  5:29 AM  Result Value Ref Range   Magnesium  1.9 1.7 - 2.4 mg/dL    Comment: Performed at [facility], [address] [place], [place] [address]  Phosphorus     Status:  Abnormal   Collection Time: 11/02/23  5:29 AM  Result Value Ref Range   Phosphorus 5.2 (H) 2.5 - 4.6 mg/dL    Comment: Performed at [facility], [address] [place], [place] [address]  Glucose, capillary     Status: Abnormal   Collection Time: 11/02/23  7:28 AM  Result Value Ref Range   Glucose-Capillary 109 (H) 70 - 99 mg/dL    Comment: Glucose reference range applies only to samples taken after fasting for at least 8 hours.  Glucose, capillary     Status: Abnormal   Collection Time: 11/02/23 11:41 AM  Result Value Ref Range   Glucose-Capillary 136 (H) 70 - 99 mg/dL    Comment: Glucose reference range applies only to samples taken after fasting for at least 8 hours.   *Note: Due to a large number of results and/or encounters for the requested time period, some results have not been displayed. A complete set of results can be found in Results Review.    MICRO:  IMAGING: No results found.  HISTORICAL MICRO/IMAGING  Assessment/Plan:    Kathryn Beck is a 68 y.o. female with PMH PAD  s/p bilateral BKA, ESRD on MWF dialysis,afib on apixaban , HTN, Type 2 DM, HFpEF, TIA presents with left hand pain with fluid filled blisters and was admitted for surgical management and further treatment of this left hand wound.  #Left hand abscess S/p irrigation and debridement POD 3 S/p long finger amputation  #MSSA As noted above, patient was brought into the hospital this time due to pain in her left hand that she noticed worsening during her dialysis session. Patient had previously had ultrasound of left hand during hospitalization on 8/6-8/7. X ray and CT was done on presentation to the ED on 8/8 that showed concerns for signs of infection. In the ED patient was hemodynamically stable and afebrile. Orthopedics was consulted and patient was taken to OR on 8/9 for irrigation and debridement of this wound. Cultures of fluid collection were sent off and blood cultures were also taken from patient.Patient's daughter notes that today she was able to wiggle her fingers a little more than she has been able to. Patient noted that she does have some pain to her left hand but feels that it is manageable. Patient states that she is unsure if her dressing have been changed yet and was concerned about this.   -CT left hand on 8/8  showed blistering and marked subcutaneous edema with stranding along the dorsal hand extending to the underlying extensor digitorum tendons with numerous scattered foci of soft tissue gas. Concerning for infection but no loculated fluid collection or osseous abnormality.  - Wound culture with gram stain showed predominant PMN, rare WBC and abundant gram positive cocci. MSSA. Blood cultures show NGTDx 4 days  -Xray of left hand on 8/8 showed soft tissue swelling over the dorsum of the hand with foci of air within the soft tissue. No osseous lesions noted.  -US  on 7/7 showed changes consistent with soft tissue infection with cellulitis and possible soft tissue gas. Subcutaneous edema and  ill defined fluid with no fluid loculation was noted.  -WBC trend 18.8> 22.5>20.1>18.3. Stabilizing now post procedure  - ESR elevated at 65 on 8/8, CRP elevated 45.1 on 8/8  Plan:  - Will start on cefazolin  today, sensitives show MSSA. D/c metronidazole  as no concern for anaerobes at this time.  -Continue to follow blood cultures  - No need for contact precautions at  this time. These have been discontinued  - Trend WBC with CBC for hopeful resolution of leukocytosis. Trend seems to be going in right direction

## 2023-11-02 NOTE — TOC Initial Note (Signed)
 Transition of Care [facility]) - Initial/Assessment Note    Patient Details  Name: Kathryn Beck MRN: [id number] Date of Birth: 1955/11/29  Transition of Care [facility]) CM/SW Contact:    Sudie Erminio Deems, RN Phone Number: 11/02/2023, 10:49 AM  Clinical Narrative: Patient presented for left hand pain and blisters-orthopedics is following the patient. Per notes, drain is in place with daily dressing changes. PTA patient was from home with spouse. Daughter at the bedside during the visit. Daughter states she is a Charity fundraiser and can assist with dressing changes in the home. Patient has used [facility] in the past and daughter wants to use this agency again. Referral submitted to [facility] for RN/OT/Aide. Patient will need orders and F2F once stable to transition home. Case Manager discussed the [facility] with the daughter. The patients spouse is a veteran and family will check to see if the patient can receive benefits for homemaker aide program in the home along with [place]. Daughter will check with the CSW at the [place]. Patient has DME hoyer lift, wheelchair, and electric wheelchair in the home. Patient has transportation to appointments and HD. Hx ESRD-MWF schedule. No further needs identified during this visit. Case Manager will continue to follow for additional disposition needs as the patient progresses.       Expected Discharge Plan: Home w Home Health Services Barriers to Discharge: Continued Medical Work up   Patient Goals and CMS Choice Patient states their goals for this hospitalization and ongoing recovery are:: patient to return home once stable.   Choice offered to / list presented to :  ([facility] has [facility] in the past and wants to use them again.)      Expected Discharge Plan and Services In-house Referral: NA Discharge Planning Services: CM Consult Post Acute Care Choice: Home Health                     DME Agency: NA       HH Arranged: Disease Management, RN, OT, Nurse's Aide HH  Agency: [facility] Date [facility] Agency Contacted: 11/02/23 Time HH Agency Contacted: [phone] Representative spoke with at [facility] Agency: Darleene  Prior Living Arrangements/Services   Lives with:: Spouse Patient language and need for interpreter reviewed:: Yes Do you feel safe going back to the place where you live?: Yes      Need for Family Participation in Patient Care: Yes (Comment) Care giver support system in place?: Yes (comment) Current home services: DME (hoyer lift, wheelchair, electric wheelchair.) Criminal Activity/Legal Involvement Pertinent to Current Situation/Hospitalization: No - Comment as needed  Activities of Daily Living   ADL Screening (condition at time of admission) Independently performs ADLs?: No Does the patient have a NEW difficulty with bathing/dressing/toileting/self-feeding that is expected to last >3 days?: No Does the patient have a NEW difficulty with getting in/out of bed, walking, or climbing stairs that is expected to last >3 days?: No Does the patient have a NEW difficulty with communication that is expected to last >3 days?: No Is the patient deaf or have difficulty hearing?: No Does the patient have difficulty seeing, even when wearing glasses/contacts?: No Does the patient have difficulty concentrating, remembering, or making decisions?: No  Permission Sought/Granted Permission sought to share information with : Family Supports, Magazine features editor, Case Estate manager/land agent granted to share information with : Yes, Verbal Permission Granted     Permission granted to share info w AGENCY: [facility]  Emotional Assessment Appearance:: Appears stated age Attitude/Demeanor/Rapport: Engaged Affect (typically observed): Appropriate Orientation: : Oriented to Self, Oriented to Place Alcohol / Substance Use: Not Applicable Psych Involvement: No (comment)  Admission diagnosis:  Left hand pain [M79.642] Abscess of left hand  [L02.512] Patient Active Problem List   Diagnosis Date Noted   Abscess of left hand 10/29/2023   Left hand pain 10/29/2023   Afib (HCC) 10/27/2023   Mass of breast 08/30/2023   Depression, major, single episode, moderate (HCC) 08/30/2023   GAD (generalized anxiety disorder) 08/30/2023   Help needed for obtaining assistive device 08/30/2023   Decreased strength, endurance, and mobility 08/30/2023   Gangrene of finger of left hand (HCC) 08/30/2023   Uncontrolled type 2 diabetes mellitus with hyperglycemia (HCC) 08/30/2023   Diarrhea 05/04/2023   RSV (acute bronchiolitis due to respiratory syncytial virus) 04/30/2023   Hearing loss 04/27/2023   Otalgia of both ears 04/27/2023   Volume overload 12/28/2022   Fall at home 12/18/2022   CVA (cerebral vascular accident) (HCC) 12/17/2022   Acute confusion 12/10/2022   Poor appetite 12/10/2022   Chills 12/10/2022   Generalized weakness 11/18/2022   Paronychia of finger of right hand 11/13/2022   Dehiscence of amputation stump of left lower extremity (HCC) 10/07/2022   Status post below-knee amputation of left lower extremity (HCC) 10/07/2022   Subacute osteomyelitis, left ankle and foot (HCC) 09/03/2022   Left foot infection 09/03/2022   Amputated left leg (HCC) 09/03/2022   Wound infection after surgery 09/03/2022   Equinus contracture of left ankle 08/15/2022   Acute osteomyelitis of toe of left foot (HCC) 08/13/2022   Peripheral vascular complication 07/21/2022   PVD (peripheral vascular disease) (HCC) 07/18/2022   Toe ulcer, left, limited to breakdown of skin (HCC) 07/13/2022   S/P BKA (below knee amputation), right (HCC) 05/29/2022   Confusion 05/19/2022   Visual hallucinations 05/19/2022   Watery eyes 05/19/2022   Requires assistance with activities of daily living (ADL) 04/26/2022   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   Gangrene of right foot (HCC) 04/25/2022   PAD (peripheral artery disease) (HCC) 04/25/2022    Other osteoporosis without current pathological fracture 11/10/2021   Hypocalcemia 11/10/2021   Fall 11/02/2021   Left foot pain 10/31/2021   Low back pain with left-sided sciatica 10/31/2021   Left ear impacted cerumen 09/18/2021   Chronic left shoulder pain 06/10/2021   Abnormal CXR 03/10/2021   Ear pain, bilateral 03/10/2021   Morbid obesity (HCC) 03/10/2021   Subungual hematoma of second toe of left foot 10/28/2020   Insomnia 10/28/2020   Memory loss or impairment 09/26/2020   Forgetfulness 09/26/2020   Recurrent vertigo 09/26/2020   Abnormal MRI, cervical spine 03/04/2020   Right leg weakness 02/28/2020   Knee pain, right 10/23/2019   Cough 08/09/2019   Carpal tunnel syndrome 06/13/2019   Chronic pain syndrome 06/13/2019   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) 06/13/2019   Encounter for support and coordination of transition of care 04/14/2019   Neck pain 03/23/2019   Near syncope 12/05/2018   ESRD on hemodialysis (HCC) 11/22/2018   Ischemic heart disease 09/25/2018   Not currently working due to disabled status 06/13/2018   HTN (hypertension) 06/12/2018   Depression 03/09/2018   Adrenal mass, left (HCC) 11/09/2017   MGUS (monoclonal gammopathy of unknown significance) 11/05/2017   Shoulder pain 11/01/2017   Chronic diastolic (congestive) heart failure (HCC) 09/20/2017   Bilateral leg weakness 09/09/2017   Adrenal  adenoma 09/03/2017   Unsteady gait 07/11/2017   Recurrent falls 07/11/2017   Anemia of chronic disease 03/24/2017   History of colonic polyps 02/10/2017   Dysphagia 11/05/2016   Irritable bowel syndrome 02/04/2016   Hospital discharge follow-up 10/27/2015   Intolerant of heat 10/07/2014   Cardiac murmur 01/17/2014   Back pain with left-sided radiculopathy 09/18/2013   Seasonal allergies 03/10/2013   Lipoma of back 12/22/2012   Other seasonal allergic rhinitis 08/22/2012   Bronchitis 12/01/2011   OSA (obstructive sleep apnea)  06/02/2011   Chronic kidney disease, stage 4 (severe) (HCC) 01/13/2011   Neuropathy 04/16/2010   Malaise and fatigue 07/24/2009   Personal history of other diseases of the nervous system and sense organs 05/08/2009   LUPUS ERYTHEMATOSUS, DISCOID 06/05/2008   Arm pain 05/30/2008   Hyperlipidemia 06/07/2007   Obesity 06/07/2007   Malignant hypertension 06/07/2007   Type 2 diabetes mellitus with hyperglycemia (HCC) 06/07/2007   Diabetes mellitus (HCC) 06/07/2007   PCP:  Antonetta Rollene BRAVO, MD Pharmacy:   [facility] - [place], [place] - [address] [place] [address] Phone: [phone] Fax: [phone]   Social Drivers of Health (SDOH) Social History: SDOH Screenings   Food Insecurity: No Food Insecurity (10/29/2023)  Housing: Low Risk  (10/29/2023)  Transportation Needs: No Transportation Needs (10/29/2023)  Utilities: Not At Risk (10/29/2023)  Alcohol Screen: Low Risk  (05/03/2023)  Depression (PHQ2-9): High Risk (08/26/2023)  Financial Resource Strain: Low Risk  (05/03/2023)  Physical Activity: Inactive (05/03/2023)  Social Connections: Moderately Isolated (10/29/2023)  Stress: No Stress Concern Present (05/03/2023)  Tobacco Use: Low Risk  (10/30/2023)  Health Literacy: Adequate Health Literacy (05/03/2023)  Recent Concern: Health Literacy - Inadequate Health Literacy (02/08/2023)   Readmission Risk Interventions    12/18/2022    3:14 PM 05/01/2022    3:54 PM  Readmission Risk Prevention Plan  Transportation Screening Complete Complete  HRI or Home Care Consult  Complete  Social Work Consult for Recovery Care Planning/Counseling  --  Palliative Care Screening  Not Applicable  Medication Review Oceanographer) Complete Complete  PCP or Specialist appointment within 3-5 days of discharge Not Complete   HRI or Home Care Consult Complete   SW Recovery Care/Counseling Consult Complete   Palliative Care Screening Not Applicable   Skilled Nursing  Facility Not Applicable

## 2023-11-02 NOTE — Progress Notes (Signed)
 PHARMACY - ANTICOAGULATION CONSULT NOTE  Pharmacy Consult for Heparin    Indication: atrial fibrillation  Allergies  Allergen Reactions   Ace Inhibitors Anaphylaxis and Swelling   Penicillins Itching and Swelling    Has tolerated cefazolin  on multiple occasions    Statins Other (See Comments)    elevated LFT's     Albuterol  Swelling    Patient Measurements: Height: 5' 5 (165.1 cm) Weight: 74.5 kg (164 lb 3.9 oz) IBW/kg (Calculated) : 57 HEPARIN  DW (KG): 72.7  Vital Signs: Temp: 98.8 F (37.1 C) (08/12 0731) Temp Source: Oral (08/12 0731) BP: 136/66 (08/12 0731)  Labs: Recent Labs    10/31/23 0439 11/01/23 0537 11/02/23 0001 11/02/23 0529  HGB 9.6* 9.7*  --  10.1*  HCT 30.0* 30.1*  --  31.3*  PLT 417* 430*  --  477*  APTT  --   --  46*  --   HEPARINUNFRC  --   --  0.56  --   CREATININE 5.84* 6.75*  --  4.42*    Estimated Creatinine Clearance: 12.5 mL/min (A) (by C-G formula based on SCr of 4.42 mg/dL (H)).   Medical History: Past Medical History:  Diagnosis Date   Acid reflux    Amputated left leg (HCC)    bilateral BKA   Anemia    Arthritis    Axillary masses    Soft tissue - status post excision   Back pain    CHF (congestive heart failure) (HCC)    COVID-19 virus infection 04/06/2019   Depression    End-stage renal disease (HCC)    M/W/F dialysis   Essential hypertension    Headache    years ago   History of blood transfusion    History of cardiac catheterization    Normal coronary arteries October 2020   History of claustrophobia    History of pneumonia 2019   Hypoxia 04/03/2019   Memory loss    Mixed hyperlipidemia    Obesity    Pancreatitis    Peritoneal dialysis catheter in place [facility])    Pneumonia due to COVID-19 virus 04/02/2019   Sleep apnea    Noncompliant with CPAP   Stroke (HCC)    mini stroke   Type 2 diabetes mellitus (HCC)     Medications:  Infusions:   amiodarone  30 mg/hr (11/02/23 0115)   cefTRIAXone  (ROCEPHIN )   IV Stopped (11/01/23 1800)   heparin  1,000 Units/hr (11/02/23 0106)   vancomycin  Stopped (11/01/23 1131)    Assessment: Subtherapeutic aPTT at 46, HL not correlating yet.   Goal of Therapy:  Heparin  level 0.3-0.7 units/ml aPTT 66-102 seconds Monitor platelets by anticoagulation protocol: Yes   Plan:  Increase heparin  infusion to 1200 units/hr Check anti-Xa level in 8 hours and daily while on heparin  Continue to monitor H&H and platelets  Tywanna Seifer CHRISTELLA Brazier 11/02/2023,8:02 AM

## 2023-11-02 NOTE — Progress Notes (Signed)
 PROGRESS NOTE    Kathryn Beck  FMW:[id number] DOB: 04/22/1955 DOA: 10/29/2023 PCP: Antonetta Rollene BRAVO, MD  Chief Complaint  Patient presents with   [name]    Brief Narrative:   Kathryn Beck is Kathryn Beck 68 year old female with Kathryn Beck history of newly diagnosed atrial fib on apixaban , PAD  (s/p R BKA in 05/2022, prior lithotripsy and stenting of the left superficial femoral and popliteal artery in 06/2022 and ultimately L BKA in 09/2022), HTN, HLD, Type 2 DM, bilateral adrenal adenoma (followed by Endocrinology), chronic HFpEF, aortic stenosis, OSA, ESRD (MWF), history of TIA's, history of recurrent chest pain  (cath in 12/2018 showing normal coronary arteries) presenting with left hand pain and blisters.   Now s/p L hand and forearm I&D.  ID c/s for abx.    Assessment & Plan:   Principal Problem:   Abscess of left hand Active Problems:   Left hand pain  Skin Soft Tissue Infection - with encephalopathy above, could be sepsis, follow lactic acid - will discuss with Dr. Agarwala  - CT with blistering and marked subcutaneous edema with stranding along the dorsal hand extending to the underlying extensor digitorum tendons with numerous scattered foci of soft tissue gas -  narrow to ancef  today per ID - Follow-up blood cultures NGx3 - now s/p L hand and forearm I&D, placement of drain 8/9 - follow surgical culture -> moderate staph aureus (oxacillin sensitive) - will c/s ID for final recs, appreciate assistance - Judicious opioids - low dose oxycodone , she's been pretty encephalopathic with IV meds   Paroxysmal atrial fibrillation  RVR - Currently in sinus rhythm - Continue metoprolol  - stop amiodarone , resume PO eliquis     Acute Metabolic Encephalopathy Resolved, suspect related to IV fentanyl   Follow lactic acid wnl, VBG (no hypercarbia), ammonia wnl,  b12 elevated, folate - wnl Delirium precautions Additional w/u if not improving  ESRD  Hyperkalemia - volume per renal  - on  T/Th/S/Su torsemide  - hyperkalemia management per renal    Essential hypertension - Continue metoprolol  succinate (non dialysis days) and amlodipine    Anxiety/depression - Cymbalta , sertraline , buspar    Diabetes mellitus type 2 with hyperglycemia - NovoLog  sliding scale - Reduced dose of semaglutide  - 10/27/2023 hemoglobin A1c 6.7   PAD - Status post bilateral BKA - s/p Stent, left superficial femoral and popliteal artery 07/21/22 ([facility]) - Continue aspirin , heparin  with encephalopathy     DVT prophylaxis: heparin  Code Status: full Family Communication: none Disposition:   Status is: Inpatient Remains inpatient appropriate because: need for continued inpatient care   Consultants:  Renal orthopedics  Procedures:   8/9 Left hand and forearm irrigation and debridement, placement of drain with loose closure  Antimicrobials:  Anti-infectives (From admission, onward)    Start     Dose/Rate Route Frequency Ordered Stop   11/03/23 1200  ceFAZolin  (ANCEF ) IVPB 2g/100 mL premix        2 g 200 mL/hr over 30 Minutes Intravenous Every M-W-F (Hemodialysis) 11/02/23 1200     11/02/23 1800  ceFAZolin  (ANCEF ) IVPB 2g/100 mL premix        2 g 200 mL/hr over 30 Minutes Intravenous  Once 11/02/23 1200     11/01/23 1600  vancomycin  (VANCOREADY) IVPB 750 mg/150 mL  Status:  Discontinued        750 mg 150 mL/hr over 60 Minutes Intravenous Every M-W-F (Hemodialysis) 10/29/23 1337 11/02/23 1057   10/31/23 1600  cefTRIAXone  (ROCEPHIN ) 2 g in sodium chloride  0.9 % 100 mL  IVPB  Status:  Discontinued       Note to Pharmacy: Retime as appropriate   2 g 200 mL/hr over 30 Minutes Intravenous Every 24 hours 10/31/23 1504 11/02/23 1057   10/30/23 1445  metroNIDAZOLE  (FLAGYL ) tablet 500 mg  Status:  Discontinued        500 mg Oral Every 12 hours 10/30/23 1356 11/02/23 1317   10/30/23 1200  ceFEPIme  (MAXIPIME ) 1 g in sodium chloride  0.9 % 100 mL IVPB  Status:  Discontinued        1 g 200 mL/hr  over 30 Minutes Intravenous Every 24 hours 10/29/23 1337 10/31/23 1504   10/29/23 1130  vancomycin  (VANCOREADY) IVPB 1500 mg/300 mL        1,500 mg 150 mL/hr over 120 Minutes Intravenous  Once 10/29/23 1110 10/29/23 1336   10/29/23 1130  ceFEPIme  (MAXIPIME ) 2 g in sodium chloride  0.9 % 100 mL IVPB        2 g 200 mL/hr over 30 Minutes Intravenous  Once 10/29/23 1117 10/29/23 1310       Subjective: More alert today Daughter at bedside  Objective: Vitals:   11/02/23 1146 11/02/23 1154 11/02/23 1612 11/02/23 1659  BP: (!) 149/68  (!) 150/73   Pulse: 73 73 71 70  Resp: 16  18   Temp: 98.9 F (37.2 C)  98.3 F (36.8 C)   TempSrc: Oral  Oral   SpO2: 99% 98% 100% 100%  Weight:      Height:        Intake/Output Summary (Last 24 hours) at 11/02/2023 1714 Last data filed at 11/02/2023 0106 Gross per 24 hour  Intake 797.1 ml  Output --  Net 797.1 ml   Filed Weights   10/30/23 0706 11/01/23 0746 11/01/23 1145  Weight: 76 kg 74.1 kg 74.5 kg    Examination:  General: No acute distress. Cardiovascular: RRR Lungs: unlabored Abdomen: Soft, nontender, nondistended  Neurological: Alert and oriented 3. Moves all extremities 4. Cranial nerves II through XII grossly intact. Extremities: L hand with intact dressing, bilateral BKA   Data Reviewed: I have personally reviewed following labs and imaging studies  CBC: Recent Labs  Lab 10/27/23 1108 10/28/23 0444 10/29/23 1029 10/30/23 0432 10/31/23 0439 11/01/23 0537 11/02/23 0529  WBC 16.9*   < > 19.3* 18.8* 22.5* 20.1* 18.3*  NEUTROABS 14.1*  --  16.5*  --  19.5* 16.9*  --   HGB 11.1*   < > 9.9* 9.7* 9.6* 9.7* 10.1*  HCT 35.6*   < > 32.8* 30.5* 30.0* 30.1* 31.3*  MCV 79.5*   < > 78.3* 76.8* 75.8* 75.3* 75.1*  PLT 329   < > 381 395 417* 430* 477*   < > = values in this interval not displayed.    Basic Metabolic Panel: Recent Labs  Lab 10/27/23 1108 10/29/23 1029 10/30/23 0432 10/31/23 0439 11/01/23 0537  11/02/23 0529  NA 138 140 142 142 141 138  K 4.0 4.1 4.4 5.4* 5.1 4.1  CL 95* 98 105 104 103 97*  CO2 24 25 22 22 22 24   GLUCOSE 105* 95 95 120* 122* 146*  BUN 26* 34* 43* 58* 75* 45*  CREATININE 3.53* 3.99* 4.89* 5.84* 6.75* 4.42*  CALCIUM  8.9 8.4* 8.6* 9.3 9.1 8.5*  MG 2.0  --   --   --  2.1 1.9  PHOS  --   --  5.3* 6.5* 6.4* 5.2*    GFR: Estimated Creatinine Clearance: 12.5 mL/min (Eulalah Rupert) (by C-G formula  based on SCr of 4.42 mg/dL (H)).  Liver Function Tests: Recent Labs  Lab 10/27/23 1108 10/29/23 1029 10/30/23 0432 10/31/23 0439 11/01/23 0537 11/02/23 0529  AST 45* 22  --   --  18 18  ALT 44 33  --   --  25 25  ALKPHOS 130* 136*  --   --  111 118  BILITOT 1.2 0.6  --   --  0.9 0.9  PROT 7.1 6.8  --   --  5.9* 6.0*  ALBUMIN  3.2* 2.7* 2.2* 2.1* 1.9* 1.9*    CBG: Recent Labs  Lab 11/01/23 1747 11/01/23 2118 11/02/23 0728 11/02/23 1141 11/02/23 1611  GLUCAP 107* 119* 109* 136* 196*     Recent Results (from the past 240 hours)  MRSA Next Gen by PCR, Nasal     Status: None   Collection Time: 10/27/23  1:43 PM   Specimen: Nasal Mucosa; Nasal Swab  Result Value Ref Range Status   MRSA by PCR Next Gen NOT DETECTED NOT DETECTED Final    Comment: (NOTE) The GeneXpert MRSA Assay (FDA approved for NASAL specimens only), is one component of Leyah Bocchino comprehensive MRSA colonization surveillance program. It is not intended to diagnose MRSA infection nor to guide or monitor treatment for MRSA infections. Test performance is not FDA approved in patients less than 41 years old. Performed at [facility], [address], [place], [place] [address]   Culture, blood (Routine X 2) w Reflex to ID Panel     Status: None   Collection Time: 10/27/23  6:59 PM   Specimen: BLOOD  Result Value Ref Range Status   Specimen Description BLOOD BLOOD RIGHT HAND  Final   Special Requests   Final    AEROBIC BOTTLE ONLY Blood Culture results may not be optimal due to an inadequate volume of blood  received in culture bottles   Culture   Final    NO GROWTH 5 DAYS Performed at [facility], [address], [place], [place] [address]    Report Status 11/01/2023 FINAL  Final  Culture, blood (Routine X 2) w Reflex to ID Panel     Status: None   Collection Time: 10/27/23  6:59 PM   Specimen: BLOOD  Result Value Ref Range Status   Specimen Description BLOOD BLOOD RIGHT HAND  Final   Special Requests   Final    AEROBIC BOTTLE ONLY Blood Culture results may not be optimal due to an inadequate volume of blood received in culture bottles   Culture   Final    NO GROWTH 5 DAYS Performed at [facility], [address] [place], [place] [address]    Report Status 11/01/2023 FINAL  Final  Blood culture (routine x 2)     Status: None (Preliminary result)   Collection Time: 10/29/23 10:29 AM   Specimen: BLOOD  Result Value Ref Range Status   Specimen Description BLOOD BLOOD RIGHT ARM RAC  Final   Special Requests   Final    BOTTLES DRAWN AEROBIC AND ANAEROBIC Blood Culture adequate volume   Culture   Final    NO GROWTH 4 DAYS Performed at [facility], [address], [place], [place] [address]    Report Status PENDING  Incomplete  Blood culture (routine x 2)     Status: None (Preliminary result)   Collection Time: 10/29/23 10:29 AM   Specimen: BLOOD  Result Value Ref Range Status   Specimen Description BLOOD BLOOD RIGHT ARM LOWER ARM  Final  Special Requests   Final    BOTTLES DRAWN AEROBIC AND ANAEROBIC Blood Culture adequate volume   Culture   Final    NO GROWTH 4 DAYS Performed at [facility], [address], [place], [place] [address]    Report Status PENDING  Incomplete  Surgical PCR screen     Status: Abnormal   Collection Time: 10/30/23  5:53 AM   Specimen: Nasal Mucosa; Nasal Swab  Result Value Ref Range Status   MRSA, PCR NEGATIVE NEGATIVE Final   Staphylococcus aureus POSITIVE (Tamlyn Sides) NEGATIVE Final    Comment: (NOTE) The Xpert SA Assay (FDA approved for NASAL specimens in  patients 35 years of age and older), is one component of Kelse Ploch comprehensive surveillance program. It is not intended to diagnose infection nor to guide or monitor treatment. Performed at [facility], [address] [place], [place] [address]   Aerobic/Anaerobic Culture w Gram Stain (surgical/deep wound)     Status: None (Preliminary result)   Collection Time: 10/30/23  8:09 AM   Specimen: Wound; Body Fluid  Result Value Ref Range Status   Specimen Description WOUND  Final   Special Requests LEFT HAND FLUID COLLECTION  Final   Gram Stain   Final    RARE WBC PRESENT, PREDOMINANTLY PMN ABUNDANT GRAM POSITIVE COCCI Performed at [facility], [address] [place], [place] [address]    Culture   Final    MODERATE STAPHYLOCOCCUS AUREUS NO ANAEROBES ISOLATED; CULTURE IN PROGRESS FOR 5 DAYS    Report Status PENDING  Incomplete   Organism ID, Bacteria STAPHYLOCOCCUS AUREUS  Final      Susceptibility   Staphylococcus aureus - MIC*    CIPROFLOXACIN  >=8 RESISTANT Resistant     ERYTHROMYCIN >=8 RESISTANT Resistant     GENTAMICIN  >=16 RESISTANT Resistant     OXACILLIN 0.5 SENSITIVE Sensitive     TETRACYCLINE >=16 RESISTANT Resistant     VANCOMYCIN  <=0.5 SENSITIVE Sensitive     TRIMETH /SULFA  >=320 RESISTANT Resistant     CLINDAMYCIN  >=8 RESISTANT Resistant     RIFAMPIN <=0.5 SENSITIVE Sensitive     Inducible Clindamycin  NEGATIVE Sensitive     LINEZOLID 2 SENSITIVE Sensitive     * MODERATE STAPHYLOCOCCUS AUREUS         Radiology Studies: No results found.       Scheduled Meds:  (feeding supplement) PROSource Plus  30 mL Oral BID BM   acetaminophen   1,000 mg Oral Q8H   apixaban   5 mg Oral BID   aspirin   81 mg Oral Daily   busPIRone   5 mg Oral BID   calcitRIOL   1 mcg Oral Q M,W,F-HD   Chlorhexidine  Gluconate Cloth  6 each Topical Q0600   Chlorhexidine  Gluconate Cloth  6 each Topical Q0600   cinacalcet   90 mg Oral Q M,W,F-HD   [START ON 11/05/2023] darbepoetin  (ARANESP ) injection - DIALYSIS  40 mcg Subcutaneous Q Fri-1800   DULoxetine   60 mg Oral Daily   ezetimibe   10 mg Oral Daily   insulin  aspart  0-5 Units Subcutaneous QHS   insulin  aspart  0-9 Units Subcutaneous TID WC   metoprolol  succinate  50 mg Oral Q T,Th,S,Su   multivitamin  1 tablet Oral Daily   mupirocin  ointment  1 Application Nasal BID   polyethylene glycol  17 g Oral BID   sertraline   50 mg Oral Daily   sucroferric oxyhydroxide  1,000 mg Oral TID WC   torsemide   100 mg Oral Q  T,Th,S,Su   Continuous Infusions:   ceFAZolin  (ANCEF ) IV 2 g (11/02/23 1657)   [START ON 11/03/2023]  ceFAZolin  (ANCEF ) IV       LOS: 4 days    Time spent: over 30 min     Meliton Monte, MD [facility]   To contact the attending provider between 7A-7P or the covering provider during after hours 7P-7A, please log into the web site [email or web address] and access using universal [place] password for that web site. If you do not have the password, please call the hospital operator.  11/02/2023, 5:14 PM

## 2023-11-02 NOTE — Evaluation (Signed)
 Occupational Therapy Evaluation Patient Details Name: Kathryn Beck MRN: [id number] DOB: 1956-03-01 Today's Date: 11/02/2023   History of Present Illness   Pt is a 68 y.o. 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 PMH includes: bilateral BKA, anemia, arthritis, CHF, ESRD on HD MWF, HTN, memory loss, obesity, OSA, stroke, HTN, and DM II.     Clinical Impressions Pt admitted with above diagnosis. Prior to hospital admission, pt lives with family who provides 24/7 supervision and assist for all transfers/ADL mgmt. Pt uses a power wheelchair and slide board for transfers due to chronic bilateral BKAs. On OT eval, pt lethargic and requires max multimodal cuing for alertness. Daughter present to assist and OT provides education regarding non-dominant LUE NWB status and AROM exercises to protect joint integrity, reduce swelling and preserve AROM needed for functional ADL and transfers. OT instructs in proximal to distal AROM, beginning with scapular retraction/protraction, elevation and elbow flexion/extension and AROM as tolerated to DIP/PIP joints 3x or more daily. Pt able to return demo with max multimodal cuing with daughter assisting in instruction/prompting due to lethargy/cognitive deficits. Pt with 9/10 pain (daughter requests pain meds after therapy due to pt lethargy), and demonstrates trace activation in remaining L hand digits, dressing intact with Ace bandage. Limited tolerance to AROM, dicussed principles of RICE and progression of gentle AROM to tolerance. Daughter verbalizes understanding, pt will require max caregiver assist for HEP. Anticipate pt will require follow-up OT services as recommended by the physician to progress AROM and healing. No further acute OT needs, OT will sign off. Thank you for the referral.      If plan is discharge home, recommend the  following:   Two people to help with walking and/or transfers;Two people to help with bathing/dressing/bathroom;Assistance with cooking/housework;Assistance with feeding;Direct supervision/assist for medications management;Direct supervision/assist for financial management;Assist for transportation;Help with stairs or ramp for entrance;Supervision due to cognitive status     Functional Status Assessment   Patient has had a recent decline in their functional status and demonstrates the ability to make significant improvements in function in a reasonable and predictable amount of time.     Equipment Recommendations   None recommended by OT (has all necessary DME)      Precautions/Restrictions   Precautions Precautions: Fall Recall of Precautions/Restrictions: Impaired Required Braces or Orthoses: Other Brace Other Brace: LUE ace wrap dressing s/p I&D Restrictions Weight Bearing Restrictions Per Provider Order: Yes LUE Weight Bearing Per Provider Order: Non weight bearing     Mobility Bed Mobility Overal bed mobility: Needs Assistance                  Transfers Overall transfer level: Needs assistance                 General transfer comment: NT, pt requires assist at baseline      Balance Overall balance assessment: Mild deficits observed, not formally tested, History of Falls                                         ADL either performed or assessed with clinical judgement   ADL Overall ADL's : Needs assistance/impaired;At baseline  General ADL Comments: Pt requires assist for bathing, dressing and transfers at  baseline. Discussed use of hoyer lift for transfers at home due to NWB via LUE.     Pertinent Vitals/Pain Pain Assessment Pain Assessment: 0-10 Pain Score: 9  Pain Location: L hand Pain Descriptors / Indicators: Constant, Aching, Discomfort, Dull, Grimacing Pain  Intervention(s): Limited activity within patient's tolerance, Monitored during session, Repositioned (RN to give pain meds post-session per discussion with RN, pt and daughter)     Extremity/Trunk Assessment Upper Extremity Assessment Upper Extremity Assessment: Right hand dominant;Generalized weakness;LUE deficits/detail LUE Deficits / Details: in ace wrap/dressing. Pt appears generally weak in BUE; has trace movements distially in digits of affected hand. LUE: Unable to fully assess due to pain;Unable to fully assess due to immobilization   Lower Extremity Assessment Lower Extremity Assessment: Generalized weakness (bilateral BKA at baseline)       Communication Communication Communication: No apparent difficulties   Cognition Arousal: Lethargic Behavior During Therapy: Flat affect Cognition: History of cognitive impairments, Cognition impaired     Awareness: Intellectual awareness impaired Memory impairment (select all impairments): Short-term memory, Working Civil Service fast streamer, Conservation officer, historic buildings, Non-declarative long-term memory Attention impairment (select first level of impairment): Focused attention Executive functioning impairment (select all impairments): Initiation, Organization, Sequencing, Reasoning, Problem solving OT - Cognition Comments: Pt does not follow commands, keeps eyes closed throughout session, requires constant cues and intervention from daughter to maintain alertness. Pt with difficulties return demonstrating AROM, does not terminate movement when cued, often requiring tactile assist. Impaired executive functioning and self-limits                 Following commands: Impaired Following commands impaired: Follows one step commands inconsistently (follows 10% of commands)     Cueing  General Comments   Cueing Techniques: Gestural cues;Verbal cues;Tactile cues      Exercises  Scapular retraction/protraction/elevation, elbow flexion/extension, digit  extension piano taps as tolerates, thumb circumduction, pronation/supination   Shoulder Instructions      Home Living Family/patient expects to be discharged to:: Private residence Living Arrangements: Spouse/significant other Available Help at Discharge: Family Type of Home: House Home Access: Stairs to enter Secretary/administrator of Steps: 2 Entrance Stairs-Rails: None Home Layout: Able to live on main level with bedroom/bathroom     Bathroom Shower/Tub: Chief Strategy Officer: Standard Bathroom Accessibility: Yes How Accessible: Accessible via wheelchair Home Equipment: BSC/3in1;Wheelchair - Forensic psychologist (2 wheels);Cane - single point;Grab bars - tub/shower;Wheelchair - power;Hand held shower head;Other (comment) (hoyer lift)          Prior Functioning/Environment Prior Level of Function : Needs assist             Mobility Comments: non-ambulatory, slide board transfers ADLs Comments: requires assist for all ADLs from family. can self-feed / grooming tasks. daughter assists with shower, and does all IADLs    OT Problem List: Decreased strength;Decreased range of motion;Decreased activity tolerance;Impaired balance (sitting and/or standing);Decreased safety awareness;Decreased cognition;Decreased knowledge of use of DME or AE;Decreased knowledge of precautions;Pain;Impaired UE functional use;Impaired sensation;Obesity;Increased edema        OT Goals(Current goals can be found in the care plan section)   Acute Rehab OT Goals OT Goal Formulation: All assessment and education complete, DC therapy Time For Goal Achievement: 11/16/23 Potential to Achieve Goals: Fair   AM-PAC OT 6 Clicks Daily Activity     Outcome Measure Help from another person eating meals?: A Little Help from another person taking care of personal grooming?:  A Little Help from another person toileting, which includes using toliet, bedpan, or urinal?: Total Help from  another person bathing (including washing, rinsing, drying)?: Total Help from another person to put on and taking off regular upper body clothing?: Total Help from another person to put on and taking off regular lower body clothing?: Total 6 Click Score: 10   End of Session Nurse Communication: Mobility status;Patient requests pain meds;Other (comment) (lethargy and pt's cognition)  Activity Tolerance: Patient limited by lethargy;Patient limited by pain Patient left: in bed;with call bell/phone within reach;with family/visitor present  OT Visit Diagnosis: Pain;Muscle weakness (generalized) (M62.81);Other abnormalities of gait and mobility (R26.89) Pain - Right/Left: Left Pain - part of body: Hand;Arm                Time: 9167-9095 OT Time Calculation (min): 32 min Charges:  OT General Charges $OT Visit: 1 Visit OT Evaluation $OT Eval Low Complexity: 1 Low OT Treatments $Self Care/Home Management : 23-37 mins Keilynn Marano L. Gabrielly Mccrystal, OTR/L  11/02/23, 9:19 AM

## 2023-11-02 NOTE — Progress Notes (Signed)
 Pharmacy Antibiotic Note  Kathryn Beck is a 68 y.o. female admitted on 10/29/2023 with cellulitis.  Pharmacy has been consulted for Ancef  dosing. Pt noted with ESRD. On HD MWF  8/9 wound culture with MSSA Last HD session 8/11  Plan: D/c vancomycin  Start cefazolin  2g IV x 1 tonight. Followed by 2g IV qHD MWF Follow plans for length of therapy  Height: 5' 5 (165.1 cm) Weight: 74.5 kg (164 lb 3.9 oz) IBW/kg (Calculated) : 57  Temp (24hrs), Avg:98.3 F (36.8 C), Min:97.9 F (36.6 C), Max:98.8 F (37.1 C)  Recent Labs  Lab 10/29/23 1029 10/30/23 0432 10/31/23 0439 11/01/23 0537 11/01/23 1314 11/02/23 0529  WBC 19.3* 18.8* 22.5* 20.1*  --  18.3*  CREATININE 3.99* 4.89* 5.84* 6.75*  --  4.42*  LATICACIDVEN 1.9  --   --   --  1.6  --     Estimated Creatinine Clearance: 12.5 mL/min (A) (by C-G formula based on SCr of 4.42 mg/dL (H)).    Allergies  Allergen Reactions   Ace Inhibitors Anaphylaxis and Swelling   Penicillins Itching and Swelling    Has tolerated cefazolin  on multiple occasions    Statins Other (See Comments)    elevated LFT's     Albuterol  Swelling    Antimicrobials this admission: cefepime  8/8 >>8/10 vancomycin  8/8 >> 8/11 Metronidazole  8/9>> Ceftriaxone  8/10>>8/11 Ancef  8/12>>  Microbiology results: 8/8 BCx: NGTD 8/9 MRSA PCR: (+) 8/9 Body fluid wound culture: MSSA  Thank you for allowing pharmacy to be a part of this patient's care.  Jeiden Daughtridge, Student-PharmD [employer], Class of 2028 11/02/2023 11:48 AM

## 2023-11-02 NOTE — Progress Notes (Signed)
 [facility] Progress Note   Subjective:    Patient seen and examined in her room this morning. Her daughter was visiting her this morning. They report that she went into atrial fib after HD. Currently on amio gtt and she is in sinus rhythm. No dyspnea or CP reported. Next HD 8/13  Objective Vitals:   11/01/23 1946 11/02/23 0028 11/02/23 0731 11/02/23 0825  BP: (!) 151/68 (!) 159/74 136/66 (!) 149/64  Pulse:    72  Resp: 20 18 15    Temp: 98.2 F (36.8 C) 98.6 F (37 C) 98.8 F (37.1 C)   TempSrc: Oral Oral Oral   SpO2:    96%  Weight:      Height:       Physical Exam General: Awake; NAD Heart: S1 and S2; RRR; No murmurs Lungs: Clear throughout Abdomen: Soft and non-tender Extremities: B/l BKAs; No edema b/l stumps Dialysis Access:  L AVF   Filed Weights   10/30/23 0706 11/01/23 0746 11/01/23 1145  Weight: 76 kg 74.1 kg 74.5 kg    Intake/Output Summary (Last 24 hours) at 11/02/2023 0934 Last data filed at 11/02/2023 0106 Gross per 24 hour  Intake 797.1 ml  Output 3000 ml  Net -2202.9 ml     Additional Objective Labs: Basic Metabolic Panel: Recent Labs  Lab 10/31/23 0439 11/01/23 0537 11/02/23 0529  NA 142 141 138  K 5.4* 5.1 4.1  CL 104 103 97*  CO2 22 22 24   GLUCOSE 120* 122* 146*  BUN 58* 75* 45*  CREATININE 5.84* 6.75* 4.42*  CALCIUM  9.3 9.1 8.5*  PHOS 6.5* 6.4* 5.2*   Liver Function Tests: Recent Labs  Lab 10/29/23 1029 10/30/23 0432 10/31/23 0439 11/01/23 0537 11/02/23 0529  AST 22  --   --  18 18  ALT 33  --   --  25 25  ALKPHOS 136*  --   --  111 118  BILITOT 0.6  --   --  0.9 0.9  PROT 6.8  --   --  5.9* 6.0*  ALBUMIN  2.7*   < > 2.1* 1.9* 1.9*   < > = values in this interval not displayed.   No results for input(s): LIPASE, AMYLASE in the last 168 hours. CBC: Recent Labs  Lab 10/29/23 1029 10/30/23 0432 10/31/23 0439 11/01/23 0537 11/02/23 0529  WBC 19.3* 18.8* 22.5* 20.1* 18.3*  NEUTROABS 16.5*  --  19.5*  16.9*  --   HGB 9.9* 9.7* 9.6* 9.7* 10.1*  HCT 32.8* 30.5* 30.0* 30.1* 31.3*  MCV 78.3* 76.8* 75.8* 75.3* 75.1*  PLT 381 395 417* 430* 477*    Medications:  amiodarone  30 mg/hr (11/02/23 0115)   cefTRIAXone  (ROCEPHIN )  IV Stopped (11/01/23 1800)   heparin  1,200 Units/hr (11/02/23 0841)   vancomycin  Stopped (11/01/23 1131)    (feeding supplement) PROSource Plus  30 mL Oral BID BM   acetaminophen   1,000 mg Oral Q8H   aspirin   81 mg Oral Daily   busPIRone   5 mg Oral BID   calcitRIOL   1 mcg Oral Q M,W,F-HD   Chlorhexidine  Gluconate Cloth  6 each Topical Q0600   cinacalcet   90 mg Oral Q M,W,F-HD   [START ON 11/05/2023] darbepoetin (ARANESP ) injection - DIALYSIS  40 mcg Subcutaneous Q Fri-1800   DULoxetine   60 mg Oral Daily   ezetimibe   10 mg Oral Daily   insulin  aspart  0-5 Units Subcutaneous QHS   insulin  aspart  0-9 Units Subcutaneous TID WC   metoprolol   succinate  50 mg Oral Q T,Th,S,Su   metroNIDAZOLE   500 mg Oral Q12H   multivitamin  1 tablet Oral Daily   mupirocin  ointment  1 Application Nasal BID   polyethylene glycol  17 g Oral BID   sertraline   50 mg Oral Daily   sucroferric oxyhydroxide  1,000 mg Oral TID WC   torsemide   100 mg Oral Q T,Th,S,Su    Dialysis Orders: [facility] - MWF 3.5hrs BFR 400 DFR 500  2K/2.5Ca  EDW 73.5kg Heparin  bolus 1000 units Calcitriol  with HD - last dose 10/29/23 Sensipar  90mg  with HD - last dose 10/29/23 Micera 50mcg Q2wks - last dose 10/18/23 [name]  1000mg  TID with meals  Assessment/Plan: Left hand pain with swelling - L hand CT showed concern for soft tissue infection. S/p L hand I&D with drain placement, per Vanc, Cefepime , and Flagyl . Blood culture resulted with G+ with moderate staph aureus. On IV antibiotics for now. She reports that it is feeling better.  ESRD -  on HD MWF.  Plan for next HD 8/13 per her usual schedule. K+ now 4.1. Will montior.  Hypertension/volume  - Bps acceptable. Continue home medications. Anemia of  CKD - Hgb 10.1. Next ESA due 8/15 Secondary Hyperparathyroidism -  Added calcitriol  and continue Sensipar . On binders. Nutrition - Renal diet with fluid restriction. Add on protein supplements today.   Belvie Och, NP [facility] 11/02/2023,9:34 AM  LOS: 4 days

## 2023-11-03 DIAGNOSIS — L02512 Cutaneous abscess of left hand: Secondary | ICD-10-CM | POA: Diagnosis not present

## 2023-11-03 DIAGNOSIS — B9561 Methicillin susceptible Staphylococcus aureus infection as the cause of diseases classified elsewhere: Secondary | ICD-10-CM | POA: Diagnosis not present

## 2023-11-03 DIAGNOSIS — D72829 Elevated white blood cell count, unspecified: Secondary | ICD-10-CM | POA: Diagnosis not present

## 2023-11-03 LAB — COMPREHENSIVE METABOLIC PANEL WITH GFR
ALT: 20 U/L (ref 0–44)
AST: 16 U/L (ref 15–41)
Albumin: 1.9 g/dL — ABNORMAL LOW (ref 3.5–5.0)
Alkaline Phosphatase: 114 U/L (ref 38–126)
Anion gap: 16 — ABNORMAL HIGH (ref 5–15)
BUN: 58 mg/dL — ABNORMAL HIGH (ref 8–23)
CO2: 26 mmol/L (ref 22–32)
Calcium: 8.9 mg/dL (ref 8.9–10.3)
Chloride: 98 mmol/L (ref 98–111)
Creatinine, Ser: 5.52 mg/dL — ABNORMAL HIGH (ref 0.44–1.00)
GFR, Estimated: 8 mL/min — ABNORMAL LOW (ref >60)
Glucose, Bld: 149 mg/dL — ABNORMAL HIGH (ref 70–99)
Potassium: 4.3 mmol/L (ref 3.5–5.1)
Sodium: 140 mmol/L (ref 135–145)
Total Bilirubin: 0.4 mg/dL (ref 0.0–1.2)
Total Protein: 5.8 g/dL — ABNORMAL LOW (ref 6.5–8.1)

## 2023-11-03 LAB — CULTURE, BLOOD (ROUTINE X 2)
Culture: NO GROWTH
Culture: NO GROWTH
Special Requests: ADEQUATE
Special Requests: ADEQUATE

## 2023-11-03 LAB — CBC WITH DIFFERENTIAL/PLATELET
Abs Immature Granulocytes: 0.86 K/uL — ABNORMAL HIGH (ref 0.00–0.07)
Basophils Absolute: 0.1 K/uL (ref 0.0–0.1)
Basophils Relative: 0 %
Eosinophils Absolute: 0.1 K/uL (ref 0.0–0.5)
Eosinophils Relative: 0 %
HCT: 32.8 % — ABNORMAL LOW (ref 36.0–46.0)
Hemoglobin: 10.4 g/dL — ABNORMAL LOW (ref 12.0–15.0)
Immature Granulocytes: 5 %
Lymphocytes Relative: 6 %
Lymphs Abs: 1 K/uL (ref 0.7–4.0)
MCH: 23.7 pg — ABNORMAL LOW (ref 26.0–34.0)
MCHC: 31.7 g/dL (ref 30.0–36.0)
MCV: 74.7 fL — ABNORMAL LOW (ref 80.0–100.0)
Monocytes Absolute: 1 K/uL (ref 0.1–1.0)
Monocytes Relative: 6 %
Neutro Abs: 13.6 K/uL — ABNORMAL HIGH (ref 1.7–7.7)
Neutrophils Relative %: 83 %
Platelets: 451 K/uL — ABNORMAL HIGH (ref 150–400)
RBC: 4.39 MIL/uL (ref 3.87–5.11)
RDW: 18.2 % — ABNORMAL HIGH (ref 11.5–15.5)
WBC: 16.6 K/uL — ABNORMAL HIGH (ref 4.0–10.5)
nRBC: 0.4 % — ABNORMAL HIGH (ref 0.0–0.2)

## 2023-11-03 LAB — GLUCOSE, CAPILLARY
Glucose-Capillary: 152 mg/dL — ABNORMAL HIGH (ref 70–99)
Glucose-Capillary: 142 mg/dL — ABNORMAL HIGH (ref 70–99)
Glucose-Capillary: 204 mg/dL — ABNORMAL HIGH (ref 70–99)

## 2023-11-03 LAB — PHOSPHORUS: Phosphorus: 6.1 mg/dL — ABNORMAL HIGH (ref 2.5–4.6)

## 2023-11-03 LAB — MAGNESIUM: Magnesium: 2.1 mg/dL (ref 1.7–2.4)

## 2023-11-03 MED ORDER — SENNOSIDES-DOCUSATE SODIUM 8.6-50 MG PO TABS: 1.0000 | ORAL_TABLET | Freq: Every evening | ORAL | Status: DC | PRN

## 2023-11-03 MED ORDER — TRAZODONE HCL 50 MG PO TABS
50.0000 mg | ORAL_TABLET | Freq: Every evening | ORAL | Status: DC | PRN
  Administered 2023-11-04: 50 mg via ORAL
  Filled 2023-11-03: qty 1

## 2023-11-03 MED ORDER — IPRATROPIUM-ALBUTEROL 0.5-2.5 (3) MG/3ML IN SOLN: 3.0000 mL | RESPIRATORY_TRACT | Status: DC | PRN

## 2023-11-03 MED ORDER — GLUCAGON HCL RDNA (DIAGNOSTIC) 1 MG IJ SOLR: 1.0000 mg | INTRAMUSCULAR | Status: DC | PRN

## 2023-11-03 MED ORDER — GUAIFENESIN 100 MG/5ML PO LIQD: 5.0000 mL | ORAL | Status: DC | PRN

## 2023-11-03 MED ORDER — METOPROLOL TARTRATE 5 MG/5ML IV SOLN: 5.0000 mg | INTRAVENOUS | Status: DC | PRN

## 2023-11-03 MED ORDER — HYDRALAZINE HCL 20 MG/ML IJ SOLN: 10.0000 mg | INTRAMUSCULAR | Status: DC | PRN

## 2023-11-03 NOTE — Progress Notes (Signed)
   Kathryn Beck - 68 y.o. female MRN [id number]  Date of birth: 18-Mar-1956    Subjective:  Seen and examined this am. Pain in hand is improving. Dressing change was performed this morning by nursing with minimal drainage.  Objective:   VITALS:   Vitals:   11/02/23 1659 11/02/23 1945 11/02/23 2324 11/03/23 0444  BP:   (!) 149/67   Pulse: 70  68   Resp:  18 18 18   Temp:  98.2 F (36.8 C) 98 F (36.7 C) 97.8 F (36.6 C)  TempSrc:  Oral Oral Oral  SpO2: 100%   100%  Weight:      Height:        Gen: Awake and alert Left upper extremity: - Dressing in place, remains clean dry and intact, digital range of motion remains limited secondary to pain particular with flexion - Sensation is intact to light touch - No significant pain up the forearm   Lab Results  Component Value Date   WBC 16.6 (H) 11/03/2023   HGB 10.4 (L) 11/03/2023   HCT 32.8 (L) 11/03/2023   MCV 74.7 (L) 11/03/2023   PLT 451 (H) 11/03/2023     Assessment/Plan:  4 Days Post-Op left upper extremity incision and drainage for abscess versus fluid collection in the setting of 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

## 2023-11-03 NOTE — Progress Notes (Signed)
 [facility] Progress Note   Subjective:    Patient seen and examined in her room this morning. Her husband was visiting with her this morning. She is not on amio gtt anymore and remains in sinus rhythm. No dyspnea or CP reported. Next HD 8/13  Objective Vitals:   11/02/23 1659 11/02/23 1945 11/02/23 2324 11/03/23 0444  BP:   (!) 149/67   Pulse: 70  68   Resp:  18 18 18   Temp:  98.2 F (36.8 C) 98 F (36.7 C) 97.8 F (36.6 C)  TempSrc:  Oral Oral Oral  SpO2: 100%   100%  Weight:      Height:       Physical Exam General: Awake; NAD Heart: S1 and S2; RRR; No murmurs Lungs: Clear throughout Abdomen: Soft and non-tender Extremities: B/l BKAs; No edema b/l stumps Dialysis Access:  L AVF   Filed Weights   10/30/23 0706 11/01/23 0746 11/01/23 1145  Weight: 76 kg 74.1 kg 74.5 kg    Intake/Output Summary (Last 24 hours) at 11/03/2023 1159 Last data filed at 11/03/2023 0600 Gross per 24 hour  Intake 516.5 ml  Output --  Net 516.5 ml     Additional Objective Labs: Basic Metabolic Panel: Recent Labs  Lab 11/01/23 0537 11/02/23 0529 11/03/23 0357  NA 141 138 140  K 5.1 4.1 4.3  CL 103 97* 98  CO2 22 24 26   GLUCOSE 122* 146* 149*  BUN 75* 45* 58*  CREATININE 6.75* 4.42* 5.52*  CALCIUM  9.1 8.5* 8.9  PHOS 6.4* 5.2* 6.1*   Liver Function Tests: Recent Labs  Lab 11/01/23 0537 11/02/23 0529 11/03/23 0357  AST 18 18 16   ALT 25 25 20   ALKPHOS 111 118 114  BILITOT 0.9 0.9 0.4  PROT 5.9* 6.0* 5.8*  ALBUMIN  1.9* 1.9* 1.9*   No results for input(s): LIPASE, AMYLASE in the last 168 hours. CBC: Recent Labs  Lab 10/30/23 0432 10/31/23 0439 11/01/23 0537 11/02/23 0529 11/03/23 0357  WBC 18.8* 22.5* 20.1* 18.3* 16.6*  NEUTROABS  --  19.5* 16.9*  --  13.6*  HGB 9.7* 9.6* 9.7* 10.1* 10.4*  HCT 30.5* 30.0* 30.1* 31.3* 32.8*  MCV 76.8* 75.8* 75.3* 75.1* 74.7*  PLT 395 417* 430* 477* 451*    Medications:   ceFAZolin  (ANCEF ) IV      (feeding  supplement) PROSource Plus  30 mL Oral BID BM   acetaminophen   1,000 mg Oral Q8H   apixaban   5 mg Oral BID   aspirin   81 mg Oral Daily   busPIRone   5 mg Oral BID   calcitRIOL   1 mcg Oral Q M,W,F-HD   Chlorhexidine  Gluconate Cloth  6 each Topical Q0600   Chlorhexidine  Gluconate Cloth  6 each Topical Q0600   cinacalcet   90 mg Oral Q M,W,F-HD   [START ON 11/05/2023] darbepoetin (ARANESP ) injection - DIALYSIS  40 mcg Subcutaneous Q Fri-1800   DULoxetine   60 mg Oral Daily   ezetimibe   10 mg Oral Daily   insulin  aspart  0-5 Units Subcutaneous QHS   insulin  aspart  0-9 Units Subcutaneous TID WC   metoprolol  succinate  50 mg Oral Q T,Th,S,Su   multivitamin  1 tablet Oral Daily   mupirocin  ointment  1 Application Nasal BID   polyethylene glycol  17 g Oral BID   sertraline   50 mg Oral Daily   sucroferric oxyhydroxide  1,000 mg Oral TID WC   torsemide   100 mg Oral Q T,Th,S,Su  Dialysis Orders: [facility] - MWF 3.5hrs BFR 400 DFR 500  2K/2.5Ca  EDW 73.5kg Heparin  bolus 1000 units Calcitriol  with HD - last dose 10/29/23 Sensipar  90mg  with HD - last dose 10/29/23 Micera 50mcg Q2wks - last dose 10/18/23 [name]  1000mg  TID with meals  Assessment/Plan: Left hand pain with swelling - L hand CT showed concern for soft tissue infection. S/p L hand I&D with drain placement, per Vanc, Cefepime , and Flagyl . Blood culture resulted with G+ with moderate staph aureus. On IV antibiotics for now. She reports that it is feeling better.  ESRD -  on HD MWF. HD this afternoon. Plan for next HD 8/15 per her usual schedule. K+ now 4.3. Will montior.  Hypertension/volume  - Bps higher today. I suspect that it will improve with UF. Continue home medications. Anemia of CKD - Hgb 10.4. Next ESA due 8/15 Secondary Hyperparathyroidism -  Added calcitriol  and continue Sensipar . On binders. Nutrition - Renal diet with fluid restriction. Add on protein supplements today.   Belvie Och, NP [facility] 11/03/2023,11:59 AM  LOS: 5 days

## 2023-11-03 NOTE — Progress Notes (Signed)
   11/03/23 1559  Vitals  Temp 97.6 F (36.4 C)  Pulse Rate 77  Resp 13  BP (!) 158/75  SpO2 100 %  Post Treatment  Dialyzer Clearance Lightly streaked  Hemodialysis Intake (mL) 0 mL  Liters Processed 68.3  Fluid Removed (mL) 2000 mL  Tolerated HD Treatment Yes  AVG/AVF Arterial Site Held (minutes) 10 minutes  AVG/AVF Venous Site Held (minutes) 10 minutes   Received patient in bed to unit.  Alert and oriented.  Informed consent signed and in chart.   TX duration:3.25HRS  Patient tolerated well.  Transported back to the room  Alert, without acute distress.  Hand-off given to patient's nurse.   Access used: LAVF Access issues: NONE  Total UF removed: 2L Medication(s) given: NONE   [facility]

## 2023-11-03 NOTE — Progress Notes (Addendum)
 [facility]    Date of Admission:  10/29/2023    Total days of antibiotics  Cefazolin  Day 2  Day 4 metronidazole - d/c today  2 Days of cefepime  8/9-8/10 2 days of ceftriaxone  2g 8/10-8/11 1 day of vancomycin  8/11          ID: Kathryn Beck is a 68 y.o. female with  PMH PAD s/p bilateral BKA, ESRD on MWF dialysis,afib on apixaban , HTN, Type 2 DM, HFpEF, TIA presents with left hand pain with fluid filled blisters and is now POD 4 for I&D of left hand wounds.  Principal Problem:   Abscess of left hand Active Problems:   Left hand pain    Subjective: Patient's husband is at bedside this morning. Patient denies any fevers/chills this morning. She reports that her left hand is still is still painful to move, especially with flexion of the hand. She is able to wiggle her fingers but barely. Says that she is working with physical therapy on this. Patient and her husband report that dressing on right hand was changed this morning. They report that they did not see any signs of drainage. Per note form ortho seemed there was minimal drainage.   Medications:   (feeding supplement) PROSource Plus  30 mL Oral BID BM   acetaminophen   1,000 mg Oral Q8H   apixaban   5 mg Oral BID   aspirin   81 mg Oral Daily   busPIRone   5 mg Oral BID   calcitRIOL   1 mcg Oral Q M,W,F-HD   Chlorhexidine  Gluconate Cloth  6 each Topical Q0600   Chlorhexidine  Gluconate Cloth  6 each Topical Q0600   cinacalcet   90 mg Oral Q M,W,F-HD   [START ON 11/05/2023] darbepoetin (ARANESP ) injection - DIALYSIS  40 mcg Subcutaneous Q Fri-1800   DULoxetine   60 mg Oral Daily   ezetimibe   10 mg Oral Daily   insulin  aspart  0-5 Units Subcutaneous QHS   insulin  aspart  0-9 Units Subcutaneous TID WC   metoprolol  succinate  50 mg Oral Q T,Th,S,Su   multivitamin  1 tablet Oral Daily   mupirocin  ointment  1 Application Nasal BID   polyethylene glycol  17 g Oral BID   sertraline   50 mg Oral Daily    sucroferric oxyhydroxide  1,000 mg Oral TID WC   torsemide   100 mg Oral Q T,Th,S,Su    Objective: Vital signs in last 24 hours: Temp:  [97.8 F (36.6 C)-98.3 F (36.8 C)] 98.1 F (36.7 C) (08/13 1214) Pulse Rate:  [65-71] 66 (08/13 1330) Resp:  [12-18] 14 (08/13 1330) BP: (149-157)/(65-73) 157/72 (08/13 1330) SpO2:  [100 %] 100 % (08/13 1330) Weight:  [76.7 kg] 76.7 kg (08/13 1214)   Physical Exam Constitutional:      General: She is not in acute distress. Cardiovascular:     Rate and Rhythm: Normal rate.  Pulmonary:     Effort: Pulmonary effort is normal. No respiratory distress.  Musculoskeletal:     Comments: Limited flexion due to pain. 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      Lab Results Recent Labs    11/02/23 0529 11/03/23 0357  WBC 18.3* 16.6*  HGB 10.1* 10.4*  HCT 31.3* 32.8*  NA 138 140  K 4.1 4.3  CL 97* 98  CO2 24 26  BUN 45* 58*  CREATININE 4.42* 5.52*   Liver Panel Recent Labs    11/02/23 0529 11/03/23 0357  PROT 6.0* 5.8*  ALBUMIN  1.9* 1.9*  AST 18 16  ALT 25 20  ALKPHOS 118 114  BILITOT 0.9 0.4   Sedimentation Rate No results for input(s): ESRSEDRATE in the last 72 hours. C-Reactive Protein No results for input(s): CRP in the last 72 hours.  Microbiology:  Studies/Results: No results found.   Assessment/Plan: Kathryn Beck is a 68 y.o. female with  PMH PAD s/p bilateral BKA, ESRD on MWF dialysis,afib on apixaban , HTN, Type 2 DM, HFpEF, TIA presents with left hand pain with fluid filled blisters and is now POD 4 for I&D of left hand wounds.  Patient states there is still pain in left hand especially with flexion but she is able to wiggle her fingers. Daily dressing changed happened this morning.   #Left hand abscess S/p irrigation and debridement POD 4 S/p long finger amputation  #MSSA  -CT left  hand on 8/8  showed blistering and marked subcutaneous edema with stranding along the dorsal hand extending to the underlying extensor digitorum tendons   - Surgical wound culture taken on 8/9 showed Rare WBC, predominantly PMN and abundant gram positive cocci. Moderate staphylococcus aureus with no anaerobes isolated. MSSA  -WBC 22.5>20.1>18.3>16.6. Downtrend is reassuring   Plan:  - Continue on IV Cefazolin  2 g dosed with HD on MWF. This is Day 2 of cefazolin   - Patient will not need a picc line since she can receive abtx with HD session and plan for 2-3 weeks of IV antibiotics  -Trend WBC with CBC for resolution of leukocytosis. Trend seems to be going in right direction.  - Follow wound care precautions per ortho   [facility] Pager: [phone]  11/03/2023, 2:01 PM  Patient was seen, examined,treatment plan was discussed with the  Advance Practice Provider.  I have personally reviewed the clinical findings, labs, imaging studies and management of this patient in detail.  I agree with the documentation, as recorded by Dr Kem.  Montie FURY Luiz MD MPH [facility] [phone]

## 2023-11-03 NOTE — Progress Notes (Signed)
 OT Cancellation Note  Patient Details Name: Kathryn Beck MRN: [id number] DOB: February 19, 1956   Cancelled Treatment:    Reason Eval/Treat Not Completed: Patient at procedure or test/ unavailable;Other (comment) Received order to fabricate splint and begin ROM L hand. Discussed with MD. Pt in HD and will not be finished until later this afternoon. Will fabricate splint and follow up in the am.  Kole Hilyard,HILLARY 11/03/2023, 12:40 PM Kreg Sink, OT/L   Acute OT Clinical Specialist Acute Rehabilitation Services Pager [phone] Office [phone]

## 2023-11-03 NOTE — TOC Progression Note (Signed)
 Transition of Care [facility]) - Progression Note    Patient Details  Name: Kathryn Beck MRN: [id number] Date of Birth: [birth year]  [facility] CM/SW Contact  Graves-Bigelow, Erminio Deems, RN Phone Number: 11/03/2023, 1:18 PM  Clinical Narrative:  Case Manager received a call from the patient's daughter and she wants the patient to have a hospital bed for home. Daughter did not have an agency preference. MD was agreeable with DME for home. Orders placed and DME was ordered via [employer]. MD is awaiting ortho clearance for home. Patient will need ambulance transport for transportation home. Daughter asked to be notified once discharge is in. No further needs identified at this time.   Expected Discharge Plan: Home w Home Health Services Barriers to Discharge: Continued Medical Work up  Expected Discharge Plan and Services In-house Referral: NA Discharge Planning Services: CM Consult Post Acute Care Choice: Home Health                   DME Arranged: Hospital bed DME Agency: [employer] Date DME Agency Contacted: 11/03/23 Time DME Agency Contacted: 1318 Representative spoke with at DME Agency: [place] HH Arranged: Disease Management, RN, OT, Nurse's Aide HH Agency: [facility] Date [facility] Agency Contacted: 11/02/23 Time HH Agency Contacted: 1049 Representative spoke with at [facility] Agency: [place]   Social Drivers of Health (SDOH) Interventions SDOH Screenings   Food Insecurity: No Food Insecurity (10/29/2023)  Housing: Low Risk  (10/29/2023)  Transportation Needs: No Transportation Needs (10/29/2023)  Utilities: Not At Risk (10/29/2023)  Alcohol Screen: Low Risk  (05/03/2023)  Depression (PHQ2-9): High Risk (08/26/2023)  Financial Resource Strain: Low Risk  (05/03/2023)  Physical Activity: Inactive (05/03/2023)  Social Connections: Moderately Isolated (10/29/2023)  Stress: No Stress Concern Present (05/03/2023)  Tobacco Use: Low Risk  (10/30/2023)  Health Literacy:  Adequate Health Literacy (05/03/2023)  Recent Concern: Health Literacy - Inadequate Health Literacy (02/08/2023)    Readmission Risk Interventions    12/18/2022    3:14 PM 05/01/2022    3:54 PM  Readmission Risk Prevention Plan  Transportation Screening Complete Complete  HRI or Home Care Consult  Complete  Social Work Consult for Recovery Care Planning/Counseling  --  Palliative Care Screening  Not Applicable  Medication Review [employer]) Complete Complete  PCP or Specialist appointment within 3-5 days of discharge Not Complete   HRI or Home Care Consult Complete   SW Recovery Care/Counseling Consult Complete   Palliative Care Screening Not Applicable   Skilled Nursing Facility Not Applicable

## 2023-11-03 NOTE — Progress Notes (Signed)
 PHARMACY CONSULT NOTE FOR:  OUTPATIENT  PARENTERAL ANTIBIOTIC THERAPY (OPAT)  Informational as the patient will receive antibiotics outpatient at the HD center  Indication: MSSA hand abscess Regimen: Cefazolin  2g/HD-MWF End date: 11/24/23  IV antibiotic discharge orders are pended. To discharging provider:  please sign these orders via discharge navigator,  Select New Orders & click on the button choice - Manage This Unsigned Work.     Thank you for allowing pharmacy to be a part of this patient's care.  Almarie Lunger, PharmD, BCPS, BCIDP Infectious Diseases Clinical Pharmacist 11/05/2023 1:17 PM   **Pharmacist phone directory can now be found on [email or web address] ([email or web address]).  Listed under [facility].

## 2023-11-03 NOTE — Progress Notes (Signed)
 PROGRESS NOTE    Kathryn Beck  FMW:[id number] DOB: [birth year] DOA: 10/29/2023 PCP: Antonetta Rollene BRAVO, MD    Brief Narrative:   68 year old female with a history of newly diagnosed atrial fib on apixaban , PAD  (s/p R BKA in 05/2022, prior lithotripsy and stenting of the left superficial femoral and popliteal artery in 06/2022 and ultimately L BKA in 09/2022), HTN, HLD, Type 2 DM, bilateral adrenal adenoma (followed by Endocrinology), chronic HFpEF, aortic stenosis, OSA, ESRD (MWF), history of TIA's, history of recurrent chest pain  (cath in 12/2018 showing normal coronary arteries) presenting with left hand pain and blisters.    Now s/p L hand and forearm I&D.  ID c/s for abx.      Assessment & Plan:  Principal Problem:   Abscess of left hand Active Problems:   Left hand pain    Left hand infection with concerns of abscess requiring I&D MSSA infection - CT with blistering and marked subcutaneous edema with stranding along the dorsal hand extending to the underlying extensor digitorum tendons with numerous scattered foci of soft tissue gas.  Seen by infectious disease, recommending cefazolin  renally dosed.  Anticipate 2-3-week treatment. - Wound care management per orthopedic   Paroxysmal atrial fibrillation  RVR -Currently on metoprolol .  Amiodarone  stopped.  Continue Eliquis    Acute Metabolic Encephalopathy Resolved, suspect related to IV fentanyl   Follow lactic acid wnl, VBG (no hypercarbia), ammonia wnl,  b12 elevated, folate - wnl Delirium precautions Additional w/u if not improving   ESRD  Hyperkalemia - volume per renal  - on T/Th/S/Su torsemide  - hyperkalemia management per renal    Essential hypertension - Continue metoprolol  succinate (non dialysis days) and amlodipine    Anxiety/depression - Cymbalta , sertraline , buspar    Diabetes mellitus type 2 with hyperglycemia - NovoLog  sliding scale - Reduced dose of semaglutide  - 10/27/2023 hemoglobin A1c 6.7    PAD Status post bilateral BKA - s/p Stent, left superficial femoral and popliteal artery 07/21/22 ([facility]) - Continue aspirin , heparin  with encephalopathy        DVT prophylaxis: heparin  Code Status: full Family Communication: none Disposition:  Continue hospital stay until cleared by orthopedic    Subjective:  Doing ok, no new complaints. Alert Awake oriented.   Examination:  General exam: Appears calm and comfortable  Respiratory system: Clear to auscultation. Respiratory effort normal. Cardiovascular system: S1 & S2 heard, RRR. No JVD, murmurs, rubs, gallops or clicks. No pedal edema. Gastrointestinal system: Abdomen is nondistended, soft and nontender. No organomegaly or masses felt. Normal bowel sounds heard. Central nervous system: Alert and oriented x 2-3. No focal neurological deficits. Extremities: Symmetric 5 x 5 power. Skin: No rashes, lesions or ulcers Psychiatry: Judgement and insight appear normal. Mood & affect appropriate. LUE dressing in place.                Diet Orders (From admission, onward)     Start     Ordered   10/30/23 0840  Diet renal with fluid restriction Fluid restriction: 1200 mL Fluid; Room service appropriate? Yes; Fluid consistency: Thin  Diet effective now       Question Answer Comment  Fluid restriction: 1200 mL Fluid   Room service appropriate? Yes   Fluid consistency: Thin      10/30/23 0839            Objective: Vitals:   11/03/23 0444 11/03/23 1214 11/03/23 1224 11/03/23 1230  BP:  (!) 154/68 (!) 153/65 (!) 153/69  Pulse:  70 65 67  Resp: 18 16 15 15   Temp: 97.8 F (36.6 C) 98.1 F (36.7 C)    TempSrc: Oral     SpO2: 100% 100% 100% 100%  Weight:  76.7 kg    Height:        Intake/Output Summary (Last 24 hours) at 11/03/2023 1255 Last data filed at 11/03/2023 0600 Gross per 24 hour  Intake 516.5 ml  Output --  Net 516.5 ml   Filed Weights   11/01/23 0746 11/01/23 1145 11/03/23 1214  Weight: 74.1  kg 74.5 kg 76.7 kg    Scheduled Meds:  (feeding supplement) PROSource Plus  30 mL Oral BID BM   acetaminophen   1,000 mg Oral Q8H   apixaban   5 mg Oral BID   aspirin   81 mg Oral Daily   busPIRone   5 mg Oral BID   calcitRIOL   1 mcg Oral Q M,W,F-HD   Chlorhexidine  Gluconate Cloth  6 each Topical Q0600   Chlorhexidine  Gluconate Cloth  6 each Topical Q0600   cinacalcet   90 mg Oral Q M,W,F-HD   [START ON 11/05/2023] darbepoetin (ARANESP ) injection - DIALYSIS  40 mcg Subcutaneous Q Fri-1800   DULoxetine   60 mg Oral Daily   ezetimibe   10 mg Oral Daily   insulin  aspart  0-5 Units Subcutaneous QHS   insulin  aspart  0-9 Units Subcutaneous TID WC   metoprolol  succinate  50 mg Oral Q T,Th,S,Su   multivitamin  1 tablet Oral Daily   mupirocin  ointment  1 Application Nasal BID   polyethylene glycol  17 g Oral BID   sertraline   50 mg Oral Daily   sucroferric oxyhydroxide  1,000 mg Oral TID WC   torsemide   100 mg Oral Q T,Th,S,Su   Continuous Infusions:   ceFAZolin  (ANCEF ) IV      Nutritional status     Body mass index is 28.14 kg/m.  Data Reviewed:   CBC: Recent Labs  Lab 10/29/23 1029 10/30/23 0432 10/31/23 0439 11/01/23 0537 11/02/23 0529 11/03/23 0357  WBC 19.3* 18.8* 22.5* 20.1* 18.3* 16.6*  NEUTROABS 16.5*  --  19.5* 16.9*  --  13.6*  HGB 9.9* 9.7* 9.6* 9.7* 10.1* 10.4*  HCT 32.8* 30.5* 30.0* 30.1* 31.3* 32.8*  MCV 78.3* 76.8* 75.8* 75.3* 75.1* 74.7*  PLT 381 395 417* 430* 477* 451*   Basic Metabolic Panel: Recent Labs  Lab 10/30/23 0432 10/31/23 0439 11/01/23 0537 11/02/23 0529 11/03/23 0357  NA 142 142 141 138 140  K 4.4 5.4* 5.1 4.1 4.3  CL 105 104 103 97* 98  CO2 22 22 22 24 26   GLUCOSE 95 120* 122* 146* 149*  BUN 43* 58* 75* 45* 58*  CREATININE 4.89* 5.84* 6.75* 4.42* 5.52*  CALCIUM  8.6* 9.3 9.1 8.5* 8.9  MG  --   --  2.1 1.9 2.1  PHOS 5.3* 6.5* 6.4* 5.2* 6.1*   GFR: Estimated Creatinine Clearance: 10.1 mL/min (A) (by C-G formula based on SCr of 5.52  mg/dL (H)). Liver Function Tests: Recent Labs  Lab 10/29/23 1029 10/30/23 0432 10/31/23 0439 11/01/23 0537 11/02/23 0529 11/03/23 0357  AST 22  --   --  18 18 16   ALT 33  --   --  25 25 20   ALKPHOS 136*  --   --  111 118 114  BILITOT 0.6  --   --  0.9 0.9 0.4  PROT 6.8  --   --  5.9* 6.0* 5.8*  ALBUMIN  2.7* 2.2* 2.1* 1.9* 1.9* 1.9*   No results for  input(s): LIPASE, AMYLASE in the last 168 hours. Recent Labs  Lab 11/01/23 1314  AMMONIA 27   Coagulation Profile: No results for input(s): INR, PROTIME in the last 168 hours. Cardiac Enzymes: No results for input(s): CKTOTAL, CKMB, CKMBINDEX, TROPONINI in the last 168 hours. BNP (last 3 results) No results for input(s): PROBNP in the last 8760 hours. HbA1C: No results for input(s): HGBA1C in the last 72 hours. CBG: Recent Labs  Lab 11/02/23 0728 11/02/23 1141 11/02/23 1611 11/02/23 2114 11/03/23 0807  GLUCAP 109* 136* 196* 239* 152*   Lipid Profile: No results for input(s): CHOL, HDL, LDLCALC, TRIG, CHOLHDL, LDLDIRECT in the last 72 hours. Thyroid  Function Tests: Recent Labs    11/01/23 1314  TSH 0.393   Anemia Panel: Recent Labs    11/01/23 1314  VITAMINB12 1,172*  FOLATE 32.4   Sepsis Labs: Recent Labs  Lab 10/29/23 1029 11/01/23 1314  LATICACIDVEN 1.9 1.6    Recent Results (from the past 240 hours)  MRSA Next Gen by PCR, Nasal     Status: None   Collection Time: 10/27/23  1:43 PM   Specimen: Nasal Mucosa; Nasal Swab  Result Value Ref Range Status   MRSA by PCR Next Gen NOT DETECTED NOT DETECTED Final    Comment: (NOTE) The GeneXpert MRSA Assay (FDA approved for NASAL specimens only), is one component of a comprehensive MRSA colonization surveillance program. It is not intended to diagnose MRSA infection nor to guide or monitor treatment for MRSA infections. Test performance is not FDA approved in patients less than 37 years old. Performed at [facility],  [address], [place], [place] [address]   Culture, blood (Routine X 2) w Reflex to ID Panel     Status: None   Collection Time: 10/27/23  6:59 PM   Specimen: BLOOD  Result Value Ref Range Status   Specimen Description BLOOD BLOOD RIGHT HAND  Final   Special Requests   Final    AEROBIC BOTTLE ONLY Blood Culture results may not be optimal due to an inadequate volume of blood received in culture bottles   Culture   Final    NO GROWTH 5 DAYS Performed at [facility], [address], [place], [place] [address]    Report Status 11/01/2023 FINAL  Final  Culture, blood (Routine X 2) w Reflex to ID Panel     Status: None   Collection Time: 10/27/23  6:59 PM   Specimen: BLOOD  Result Value Ref Range Status   Specimen Description BLOOD BLOOD RIGHT HAND  Final   Special Requests   Final    AEROBIC BOTTLE ONLY Blood Culture results may not be optimal due to an inadequate volume of blood received in culture bottles   Culture   Final    NO GROWTH 5 DAYS Performed at [facility], [address], [place], [place] [address]    Report Status 11/01/2023 FINAL  Final  Blood culture (routine x 2)     Status: None   Collection Time: 10/29/23 10:29 AM   Specimen: BLOOD  Result Value Ref Range Status   Specimen Description BLOOD BLOOD RIGHT ARM RAC  Final   Special Requests   Final    BOTTLES DRAWN AEROBIC AND ANAEROBIC Blood Culture adequate volume   Culture   Final    NO GROWTH 5 DAYS Performed at [facility], [address], [place], [place] [address]    Report Status 11/03/2023 FINAL  Final  Blood culture (routine x 2)  Status: None   Collection Time: 10/29/23 10:29 AM   Specimen: BLOOD  Result Value Ref Range Status   Specimen Description BLOOD BLOOD RIGHT ARM LOWER ARM  Final   Special Requests   Final    BOTTLES DRAWN AEROBIC AND ANAEROBIC Blood Culture adequate volume   Culture   Final    NO GROWTH 5 DAYS Performed at [facility], [address], [place], [place] [address]    Report  Status 11/03/2023 FINAL  Final  Surgical PCR screen     Status: Abnormal   Collection Time: 10/30/23  5:53 AM   Specimen: Nasal Mucosa; Nasal Swab  Result Value Ref Range Status   MRSA, PCR NEGATIVE NEGATIVE Final   Staphylococcus aureus POSITIVE (A) NEGATIVE Final    Comment: (NOTE) The Xpert SA Assay (FDA approved for NASAL specimens in patients 54 years of age and older), is one component of a comprehensive surveillance program. It is not intended to diagnose infection nor to guide or monitor treatment. Performed at [facility], [address], [place], [place] [address]   Aerobic/Anaerobic Culture w Gram Stain (surgical/deep wound)     Status: None (Preliminary result)   Collection Time: 10/30/23  8:09 AM   Specimen: Wound; Body Fluid  Result Value Ref Range Status   Specimen Description WOUND  Final   Special Requests LEFT HAND FLUID COLLECTION  Final   Gram Stain   Final    RARE WBC PRESENT, PREDOMINANTLY PMN ABUNDANT GRAM POSITIVE COCCI Performed at [facility], [address], [place], [place] [address]    Culture   Final    MODERATE STAPHYLOCOCCUS AUREUS NO ANAEROBES ISOLATED; CULTURE IN PROGRESS FOR 5 DAYS    Report Status PENDING  Incomplete   Organism ID, Bacteria STAPHYLOCOCCUS AUREUS  Final      Susceptibility   Staphylococcus aureus - MIC*    CIPROFLOXACIN  >=8 RESISTANT Resistant     ERYTHROMYCIN >=8 RESISTANT Resistant     GENTAMICIN  >=16 RESISTANT Resistant     OXACILLIN 0.5 SENSITIVE Sensitive     TETRACYCLINE >=16 RESISTANT Resistant     VANCOMYCIN  <=0.5 SENSITIVE Sensitive     TRIMETH /SULFA  >=320 RESISTANT Resistant     CLINDAMYCIN  >=8 RESISTANT Resistant     RIFAMPIN <=0.5 SENSITIVE Sensitive     Inducible Clindamycin  NEGATIVE Sensitive     LINEZOLID 2 SENSITIVE Sensitive     * MODERATE STAPHYLOCOCCUS AUREUS         Radiology Studies: No results found.         LOS: 5 days   Time spent= 35 mins    Burgess JAYSON Dare,  MD [facility]  If 7PM-7AM, please contact night-coverage  11/03/2023, 12:55 PM

## 2023-11-03 NOTE — Evaluation (Signed)
 Physical Therapy Evaluation Patient Details Name: Kathryn Beck MRN: [id number] DOB: December 29, 1955 Today's Date: 11/03/2023  History of Present Illness  Pt is a 68 y.o. female presenting with new AFIB, acute metabolic encephalopathy, and L hand abscess s/p I&D with drain placement 8/9. 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 PMH includes: bilateral BKA, anemia, arthritis, CHF, ESRD on HD MWF, HTN, memory loss, obesity, OSA, stroke, HTN, and DM II.  Clinical Impression  Pt presents to PT with deficits in functional mobility, strength, power. PT evaluation is limited as the pt declines functional bed mobility or transfers due to L hand pain. Pt demonstrates functional ROM of BLE and is able to meet resistance of PT in all available motions at hips and knees. Pt reports she has not recently been transferring into her wheelchair due to L hand pain/wound and that prior to this wound she would perform sliding board transfers. The patient has a hoyer lift at home to utilize for transfers out of bed until her left hand heals. Pt will benefit from continued mobilization and PT services in an effort to improve strength and mobility tolerance, while also emphasizing maintenance of weightbearing restrictions through L hand. PT recommends HHPT at the time of discharge.        If plan is discharge home, recommend the following: Two people to help with walking and/or transfers;Two people to help with bathing/dressing/bathroom;Assistance with cooking/housework;Assistance with feeding;Assist for transportation;Help with stairs or ramp for entrance;Supervision due to cognitive status   Can travel by private vehicle        Equipment Recommendations Hospital bed  Recommendations for Other Services       Functional Status Assessment Patient has had a recent decline in their functional status and demonstrates the ability to make significant improvements in function in a reasonable and predictable amount  of time.     Precautions / Restrictions Precautions Precautions: Fall Recall of Precautions/Restrictions: Impaired Restrictions Weight Bearing Restrictions Per Provider Order: Yes LUE Weight Bearing Per Provider Order: Non weight bearing      Mobility  Bed Mobility Overal bed mobility:  (pt declines attempts at rolling or other functional bed mobility due to pain)                  Transfers Overall transfer level:  (pt declines attempts at transfers)                      Ambulation/Gait                  Stairs            Wheelchair Mobility     Tilt Bed    Modified Rankin (Stroke Patients Only)       Balance                                             Pertinent Vitals/Pain Pain Assessment Pain Assessment: Faces Faces Pain Scale: Hurts whole lot Pain Location: L hand Pain Descriptors / Indicators: Grimacing Pain Intervention(s): Monitored during session    Home Living Family/patient expects to be discharged to:: Private residence Living Arrangements: Spouse/significant other Available Help at Discharge: Family Type of Home: House Home Access: Stairs to enter Entrance Stairs-Rails: None Entrance Stairs-Number of Steps: 2   Home Layout: Able to live on main level with bedroom/bathroom  Home Equipment: BSC/3in1;Wheelchair - Forensic psychologist (2 wheels);Cane - single point;Grab bars - tub/shower;Wheelchair - power;Hand held shower head;Other (comment) (hoyer lift)      Prior Function Prior Level of Function : Needs assist             Mobility Comments: pt reports she typically utilizes a slide board to transfer from bed to power wheelchair, however she reports she has remained in the bed for at leat 3 weeks since L hand began to become bothersome ADLs Comments: requires assist for all ADLs from family. can self-feed / grooming tasks. daughter assists with shower, and does all IADLs      Extremity/Trunk Assessment   Upper Extremity Assessment Upper Extremity Assessment: RUE deficits/detail;LUE deficits/detail RUE Deficits / Details: ROM WFL, strength grossly 4-/5 aside from 3/5 grip strength LUE Deficits / Details: shoulder flexion to ~120 degrees, elbow flexion extension AROM appears functional, strength not formally assessed    Lower Extremity Assessment Lower Extremity Assessment: RLE deficits/detail;LLE deficits/detail RLE Deficits / Details: ROM WFL, strength 4/5 grossly LLE Deficits / Details: ROM WFL, strength 4/5 grossly    Cervical / Trunk Assessment Cervical / Trunk Assessment: Normal  Communication   Communication Communication: No apparent difficulties    Cognition Arousal: Alert Behavior During Therapy: WFL for tasks assessed/performed   PT - Cognitive impairments: No family/caregiver present to determine baseline, Awareness, Memory                         Following commands: Impaired Following commands impaired: Follows multi-step commands inconsistently     Cueing Cueing Techniques: Verbal cues, Visual cues     General Comments General comments (skin integrity, edema, etc.): pt in NAD, VSS    Exercises     Assessment/Plan    PT Assessment Patient needs continued PT services  PT Problem List Decreased strength;Decreased activity tolerance;Decreased balance;Decreased mobility;Decreased knowledge of use of DME;Decreased safety awareness;Decreased knowledge of precautions;Pain;Decreased cognition       PT Treatment Interventions DME instruction;Functional mobility training;Therapeutic activities;Therapeutic exercise;Balance training;Neuromuscular re-education;Patient/family education;Wheelchair mobility training    PT Goals (Current goals can be found in the Care Plan section)  Acute Rehab PT Goals Patient Stated Goal: to eventually walk again PT Goal Formulation: With patient Time For Goal Achievement: 11/17/23 Potential  to Achieve Goals: Fair    Frequency Min 1X/week     Co-evaluation               AM-PAC PT 6 Clicks Mobility  Outcome Measure Help needed turning from your back to your side while in a flat bed without using bedrails?: Total Help needed moving from lying on your back to sitting on the side of a flat bed without using bedrails?: Total Help needed moving to and from a bed to a chair (including a wheelchair)?: Total Help needed standing up from a chair using your arms (e.g., wheelchair or bedside chair)?: Total Help needed to walk in hospital room?: Total Help needed climbing 3-5 steps with a railing? : Total 6 Click Score: 6    End of Session   Activity Tolerance: Patient limited by pain Patient left: in bed;with call bell/phone within reach;with bed alarm set Nurse Communication: Mobility status PT Visit Diagnosis: Other abnormalities of gait and mobility (R26.89);Muscle weakness (generalized) (M62.81);Pain Pain - Right/Left: Left Pain - part of body: Hand    Time: 8294-8277 PT Time Calculation (min) (ACUTE ONLY): 17 min   Charges:   PT Evaluation $PT Eval  Low Complexity: 1 Low   PT General Charges $$ ACUTE PT VISIT: 1 Visit         Bernardino JINNY Ruth, PT, DPT Acute Rehabilitation Office [phone]   Bernardino JINNY Ruth 11/03/2023, 5:32 PM

## 2023-11-04 ENCOUNTER — Encounter (HOSPITAL_BASED_OUTPATIENT_CLINIC_OR_DEPARTMENT_OTHER): Admitting: Family

## 2023-11-04 ENCOUNTER — Other Ambulatory Visit: Payer: Self-pay | Admitting: *Deleted

## 2023-11-04 DIAGNOSIS — L02512 Cutaneous abscess of left hand: Secondary | ICD-10-CM | POA: Diagnosis not present

## 2023-11-04 DIAGNOSIS — B9561 Methicillin susceptible Staphylococcus aureus infection as the cause of diseases classified elsewhere: Secondary | ICD-10-CM | POA: Diagnosis not present

## 2023-11-04 DIAGNOSIS — D72829 Elevated white blood cell count, unspecified: Secondary | ICD-10-CM | POA: Diagnosis not present

## 2023-11-04 LAB — BASIC METABOLIC PANEL WITH GFR
Anion gap: 14 (ref 5–15)
BUN: 38 mg/dL — ABNORMAL HIGH (ref 8–23)
CO2: 26 mmol/L (ref 22–32)
Calcium: 9 mg/dL (ref 8.9–10.3)
Chloride: 98 mmol/L (ref 98–111)
Creatinine, Ser: 3.89 mg/dL — ABNORMAL HIGH (ref 0.44–1.00)
GFR, Estimated: 12 mL/min — ABNORMAL LOW (ref >60)
Glucose, Bld: 148 mg/dL — ABNORMAL HIGH (ref 70–99)
Potassium: 4.4 mmol/L (ref 3.5–5.1)
Sodium: 138 mmol/L (ref 135–145)

## 2023-11-04 LAB — CBC
HCT: 34.4 % — ABNORMAL LOW (ref 36.0–46.0)
Hemoglobin: 11 g/dL — ABNORMAL LOW (ref 12.0–15.0)
MCH: 24 pg — ABNORMAL LOW (ref 26.0–34.0)
MCHC: 32 g/dL (ref 30.0–36.0)
MCV: 75.1 fL — ABNORMAL LOW (ref 80.0–100.0)
Platelets: 433 K/uL — ABNORMAL HIGH (ref 150–400)
RBC: 4.58 MIL/uL (ref 3.87–5.11)
RDW: 18.8 % — ABNORMAL HIGH (ref 11.5–15.5)
WBC: 17.5 K/uL — ABNORMAL HIGH (ref 4.0–10.5)
nRBC: 0.3 % — ABNORMAL HIGH (ref 0.0–0.2)

## 2023-11-04 LAB — GLUCOSE, CAPILLARY
Glucose-Capillary: 158 mg/dL — ABNORMAL HIGH (ref 70–99)
Glucose-Capillary: 203 mg/dL — ABNORMAL HIGH (ref 70–99)
Glucose-Capillary: 204 mg/dL — ABNORMAL HIGH (ref 70–99)
Glucose-Capillary: 161 mg/dL — ABNORMAL HIGH (ref 70–99)

## 2023-11-04 LAB — AEROBIC/ANAEROBIC CULTURE W GRAM STAIN (SURGICAL/DEEP WOUND)

## 2023-11-04 LAB — MAGNESIUM: Magnesium: 1.9 mg/dL (ref 1.7–2.4)

## 2023-11-04 LAB — PHOSPHORUS: Phosphorus: 4.2 mg/dL (ref 2.5–4.6)

## 2023-11-04 MED ORDER — AMLODIPINE BESYLATE 5 MG PO TABS
5.0000 mg | ORAL_TABLET | Freq: Every day | ORAL | Status: DC
  Administered 2023-11-04 – 2023-11-05 (×2): 5 mg via ORAL
  Filled 2023-11-04 (×2): qty 1

## 2023-11-04 NOTE — Evaluation (Signed)
 Occupational Therapy Evaluation Patient Details Name: Kathryn Beck MRN: [id number] DOB: 1956/02/06 Today's Date: 11/04/2023   History of Present Illness   Pt is a 68 y.o. female presenting with new AFIB, acute metabolic encephalopathy, and L hand abscess s/p I&D with drain placement 8/9. 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 PMH includes: bilateral BKA, anemia, arthritis, CHF, ESRD on HD MWF, HTN, memory loss, obesity, OSA, stroke, HTN, and DM II.     Clinical Impressions Pt's family assists with all ADL tasks and mobility with slide board. Pt will ned to use a hoyer lift for mobility as she is NWB on LUE. Daughter states they have a hoyer at home. Will return to fabricate splint after pt is premedicated. Recommend HHOT for LUE rehab and splint management and to maximize independence with ADL tasks     If plan is discharge home, recommend the following:   Two people to help with walking and/or transfers;Two people to help with bathing/dressing/bathroom;Assistance with cooking/housework;Assistance with feeding;Direct supervision/assist for medications management;Direct supervision/assist for financial management;Assist for transportation;Help with stairs or ramp for entrance;Supervision due to cognitive status     Functional Status Assessment   Patient has had a recent decline in their functional status and demonstrates the ability to make significant improvements in function in a reasonable and predictable amount of time.     Equipment Recommendations    (has all necessary DME)     Recommendations for Other Services         Precautions/Restrictions   Precautions Precautions: Fall Recall of Precautions/Restrictions: Impaired Required Braces or Orthoses: Other Brace Other Brace: LUE ace wrap dressing s/p I&D Restrictions Weight Bearing Restrictions Per Provider Order: Yes LUE Weight Bearing Per Provider Order: Non weight bearing     Mobility Bed  Mobility Overal bed mobility:  (pt declines attempts at rolling or other functional bed mobility due to pain)                  Transfers Overall transfer level:  (pt declines attempts at transfers)                 General transfer comment: NT, pt requires assist at baseline; will need hoyer      Balance Overall balance assessment: Mild deficits observed, not formally tested, History of Falls                                         ADL either performed or assessed with clinical judgement   ADL Overall ADL's : Needs assistance/impaired;At baseline                                       General ADL Comments: Pt requires assist for bathing, dressing and transfers at  baseline. Discussed use of hoyer lift for transfers at home due to NWB via LUE.     Vision Baseline Vision/History: 1 Wears glasses Ability to See in Adequate Light: 1 Impaired       Perception         Praxis         Pertinent Vitals/Pain Pain Assessment Pain Assessment: Faces Faces Pain Scale: Hurts even more Breathing: normal Negative Vocalization: none Facial Expression: smiling or inexpressive Body Language: relaxed Consolability: no need to console PAINAD Score: 0 Pain  Location: L hand Pain Descriptors / Indicators: Grimacing Pain Intervention(s): Limited activity within patient's tolerance     Extremity/Trunk Assessment Upper Extremity Assessment RUE Deficits / Details: ROM WFL, strength grossly 4-/5 aside from 4/5 grip strength LUE Deficits / Details: should/elbow overall WFL; positions hand in wrist flexion at rest. Able to passively range to @ 30 degrees extension; minimal movement of digits; tolerates @ 30 degrees total passively IPs,MPs - more movement noted in 4th and 5th digits. LUE Coordination: decreased fine motor (not using functionally)       Cervical / Trunk Assessment Cervical / Trunk Assessment: Normal   Communication  Communication Communication: No apparent difficulties   Cognition Arousal: Alert Behavior During Therapy: WFL for tasks assessed/performed Cognition: History of cognitive impairments, Cognition impaired     Awareness: Intellectual awareness impaired, Online awareness impaired Memory impairment (select all impairments): Short-term memory, Working Civil Service fast streamer, Conservation officer, historic buildings Attention impairment (select first level of impairment): Sustained attention Executive functioning impairment (select all impairments): Problem solving OT - Cognition Comments: Pt does not follow commands, keeps eyes closed throughout session, requires constant cues and intervention from daughter to maintain alertness. Pt with difficulties return demonstrating AROM, does not terminate movement when cued, often requiring tactile assist. Impaired executive functioning and self-limits                 Following commands: Impaired Following commands impaired: Only follows one step commands consistently     Cueing  General Comments   Cueing Techniques: Verbal cues;Visual cues      Exercises Exercises: General Upper Extremity General Exercises - Upper Extremity Elbow Flexion: Left, 5 reps, AROM Elbow Extension: Left, 10 reps, AROM Wrist Flexion: Left, 5 reps, AROM Wrist Extension: AAROM, Left, 10 reps Digit Composite Flexion: AAROM, Left, 5 reps Composite Extension: AAROM, Left, 5 reps   Shoulder Instructions      Home Living Family/patient expects to be discharged to:: Private residence Living Arrangements: Spouse/significant other Available Help at Discharge: Family Type of Home: House Home Access: Stairs to enter Secretary/administrator of Steps: 2 Entrance Stairs-Rails: None Home Layout: Able to live on main level with bedroom/bathroom     Bathroom Shower/Tub: Chief Strategy Officer: Standard Bathroom Accessibility: Yes How Accessible: Accessible via wheelchair Home  Equipment: BSC/3in1;Wheelchair - Forensic psychologist (2 wheels);Cane - single point;Grab bars - tub/shower;Wheelchair - power;Hand held shower head;Other (comment) (hoyer lift)          Prior Functioning/Environment Prior Level of Function : Needs assist       Physical Assist : Mobility (physical);ADLs (physical)     Mobility Comments: pt reports she typically utilizes a slide board to transfer from bed to power wheelchair, however she reports she has remained in the bed for at leat 3 weeks since L hand began to become bothersome ADLs Comments: requires assist for all ADLs from family. can self-feed / grooming tasks. daughter assists with shower, and does all IADLs    OT Problem List: Decreased strength;Decreased range of motion;Decreased activity tolerance;Impaired balance (sitting and/or standing);Decreased safety awareness;Decreased cognition;Decreased knowledge of use of DME or AE;Decreased knowledge of precautions;Pain;Impaired UE functional use;Impaired sensation;Obesity;Increased edema   OT Treatment/Interventions:        OT Goals(Current goals can be found in the care plan section)   Acute Rehab OT Goals Patient Stated Goal: home OT Goal Formulation: All assessment and education complete, DC therapy Time For Goal Achievement: 11/18/23 Potential to Achieve Goals: Fair   OT Frequency:  Co-evaluation              AM-PAC OT 6 Clicks Daily Activity     Outcome Measure Help from another person eating meals?: A Little Help from another person taking care of personal grooming?: A Little Help from another person toileting, which includes using toliet, bedpan, or urinal?: Total Help from another person bathing (including washing, rinsing, drying)?: Total Help from another person to put on and taking off regular upper body clothing?: Total Help from another person to put on and taking off regular lower body clothing?: Total 6 Click Score: 10   End of Session  Nurse Communication: Mobility status;Patient requests pain meds;Other (comment) (lethargy and pt's cognition)  Activity Tolerance: Patient limited by lethargy;Patient limited by pain Patient left: in bed;with call bell/phone within reach;with family/visitor present  OT Visit Diagnosis: Pain;Muscle weakness (generalized) (M62.81);Other abnormalities of gait and mobility (R26.89) Pain - Right/Left: Left Pain - part of body: Hand;Arm                Time: 9082-9060 OT Time Calculation (min): 22 min Charges:  OT General Charges $OT Visit: 1 Visit OT Evaluation $OT Eval Moderate Complexity: 1 Mod  Heberto Sturdevant, OT/L   Acute OT Clinical Specialist Acute Rehabilitation Services Pager [phone] Office [phone]   [facility] 11/04/2023, 1:17 PM

## 2023-11-04 NOTE — Progress Notes (Signed)
 [facility]    Date of Admission:  10/29/2023    Total days of antibiotics  Day 3 of cefazolin   Day 4 metronidazole - d/c today  2 Days of cefepime  8/9-8/10 2 days of ceftriaxone  2g 8/10-8/11 1 day of vancomycin  8/11          ID: ERMEL VERNE is a 68 y.o. female with  PMH PAD s/p bilateral BKA, ESRD on MWF dialysis,afib on apixaban , HTN, Type 2 DM, HFpEF, TIA presents with left hand pain with fluid filled blisters and is now POD 5 for I&D of left hand wounds.  Principal Problem:   Abscess of left hand Active Problems:   Left hand pain    Subjective: Patient was sitting up in bed this morning. No other family at bedside this morning. Patient reports that she was awake during her dressing change this morning and noted some purulence she said on her left hand.   Medications:   (feeding supplement) PROSource Plus  30 mL Oral BID BM   acetaminophen   1,000 mg Oral Q8H   amLODipine   5 mg Oral Daily   apixaban   5 mg Oral BID   aspirin   81 mg Oral Daily   busPIRone   5 mg Oral BID   calcitRIOL   1 mcg Oral Q M,W,F-HD   Chlorhexidine  Gluconate Cloth  6 each Topical Q0600   Chlorhexidine  Gluconate Cloth  6 each Topical Q0600   cinacalcet   90 mg Oral Q M,W,F-HD   [START ON 11/05/2023] darbepoetin (ARANESP ) injection - DIALYSIS  40 mcg Subcutaneous Q Fri-1800   DULoxetine   60 mg Oral Daily   ezetimibe   10 mg Oral Daily   insulin  aspart  0-5 Units Subcutaneous QHS   insulin  aspart  0-9 Units Subcutaneous TID WC   metoprolol  succinate  50 mg Oral Q T,Th,S,Su   multivitamin  1 tablet Oral Daily   polyethylene glycol  17 g Oral BID   sertraline   50 mg Oral Daily   sucroferric oxyhydroxide  1,000 mg Oral TID WC   torsemide   100 mg Oral Q T,Th,S,Su    Objective: Vital signs in last 24 hours: Temp:  [97.6 F (36.4 C)-98.5 F (36.9 C)] 98.1 F (36.7 C) (08/14 0753) Pulse Rate:  [65-81] 81 (08/14 0944) Resp:  [11-16] 13 (08/14 0753) BP: (138-171)/(64-78)  165/75 (08/14 0944) SpO2:  [93 %-100 %] 98 % (08/14 0753) Weight:  [76.7 kg] 76.7 kg (08/13 1214)   Physical Exam Constitutional:      General: She is not in acute distress. Cardiovascular:     Rate and Rhythm: Normal rate.  Pulmonary:     Effort: Pulmonary effort is normal. No respiratory distress.  Musculoskeletal:     Comments: Limited flexion due to pain. 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      Lab Results Recent Labs    11/03/23 0357 11/04/23 0436  WBC 16.6* 17.5*  HGB 10.4* 11.0*  HCT 32.8* 34.4*  NA 140 138  K 4.3 4.4  CL 98 98  CO2 26 26  BUN 58* 38*  CREATININE 5.52* 3.89*   Liver Panel Recent Labs    11/02/23 0529 11/03/23 0357  PROT 6.0* 5.8*  ALBUMIN  1.9* 1.9*  AST 18 16  ALT 25 20  ALKPHOS 118 114  BILITOT 0.9 0.4   Sedimentation  Rate No results for input(s): ESRSEDRATE in the last 72 hours. C-Reactive Protein No results for input(s): CRP in the last 72 hours.  Microbiology:  Studies/Results: No results found.   Assessment/Plan: YANEL DOMBROSKY is a 68 y.o. female with  PMH PAD s/p bilateral BKA, ESRD on MWF dialysis,afib on apixaban , HTN, Type 2 DM, HFpEF, TIA presents with left hand pain with fluid filled blisters and is now POD 5 for I&D of left hand wounds.   #Left hand abscess S/p irrigation and debridement POD 5 S/p long finger amputation  #MSSA Patient report that she is still having pain in her hands this morning but states that is unchanged. Says that she did note some purulence when dressing change happened this morning.   -CT left hand on 8/8  showed blistering and marked subcutaneous edema with stranding along the dorsal hand extending to the underlying extensor digitorum tendons   - Surgical wound culture taken on 8/9 showed Rare WBC, predominantly PMN and abundant gram positive cocci. Moderate  staphylococcus aureus with no anaerobes isolated. MSSA  -WBC 22.5>20.1>18.3>16.6>17.5. Patient has been afebrile and no new growth in cultures. This is reassuring. Could just be mild fluctuation   Plan:  - Continue on IV Cefazolin  2 g dosed with HD on MWF. This is Day 3 of cefazolin   - Patient will not need a picc line since she can receive abtx with HD session and plan is for three weeks of antibiotics with dialysis schedule.  - Follow wound care precautions per ortho   Will sign off at this time. Patient will follow up in ID clinic. [facility] Pager: [phone]  11/04/2023, 10:14 AM

## 2023-11-04 NOTE — Plan of Care (Signed)
  Problem: Education: Goal: Knowledge of General Education information will improve Description: Including pain rating scale, medication(s)/side effects and non-pharmacologic comfort measures Outcome: Progressing   Problem: Clinical Measurements: Goal: Will remain free from infection Outcome: Progressing   Problem: Health Behavior/Discharge Planning: Goal: Ability to manage health-related needs will improve Outcome: Progressing   Problem: Pain Managment: Goal: General experience of comfort will improve and/or be controlled Outcome: Progressing

## 2023-11-04 NOTE — Progress Notes (Signed)
 Occupational therapy Note  Pt seen for fabrication of L resting hand splint.  Pt tolerated well. Straps fastened gently over dorsum of hand. Daughter present and educated on splint use and wearing schedule. Pt to remove splint q 2 hours during the day to complete ROM (will provide HEP)/exercises. Pt to sleep in splint. Will follow up for splint check. Discussed with nsg.      Media Information    11/04/23 1600  OT Visit Information  Last OT Received On 11/04/23  Assistance Needed +2  History of Present Illness Pt is a 68 y.o. female presenting with new AFIB, acute metabolic encephalopathy, and L hand abscess s/p I&D with drain placement 8/9. 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 PMH includes: bilateral BKA, anemia, arthritis, CHF, ESRD on HD MWF, HTN, memory loss, obesity, OSA, stroke, HTN, and DM II.  Precautions  Precautions Fall  Recall of Precautions/Restrictions Impaired  Required Braces or Orthoses Splint/Cast (L resting hand splint fabricated)  Restrictions  LUE Weight Bearing Per Provider Order NWB  Pain Assessment  Pain Assessment Faces  Faces Pain Scale 4  Pain Location L hand  Pain Descriptors / Indicators Grimacing  Pain Intervention(s) Limited activity within patient's tolerance  Cognition  Arousal Alert  Behavior During Therapy WFL for tasks assessed/performed  Cognition History of cognitive impairments  General Comments  General comments (skin integrity, edema, etc.) resting hadn splint fabricated with perforated Tailor after bulky Kerlex removed; 4x4s left on wound, however 4x4s with incrased drainage and replaced with clean guaze; penrose drains repositioned around guaze as drainage causing macerated areas on skin  OT - End of Session  Activity Tolerance Patient tolerated treatment well  Patient left in bed;with call bell/phone within reach;with family/visitor present  Nurse Communication Other (comment) (splint care)  OT Assessment/Plan  OT  Visit Diagnosis Pain;Muscle weakness (generalized) (M62.81);Other abnormalities of gait and mobility (R26.89)  Pain - Right/Left Left  Pain - part of body Hand;Arm  Follow Up Recommendations Home health OT  Patient can return home with the following Two people to help with walking and/or transfers;Two people to help with bathing/dressing/bathroom;Assistance with cooking/housework;Assistance with feeding;Direct supervision/assist for medications management;Direct supervision/assist for financial management;Assist for transportation;Help with stairs or ramp for entrance;Supervision due to cognitive status  OT Equipment Hospital bed  AM-PAC OT 6 Clicks Daily Activity Outcome Measure (Version 2)  Help from another person eating meals? 3  Help from another person taking care of personal grooming? 3  Help from another person toileting, which includes using toliet, bedpan, or urinal? 1  Help from another person bathing (including washing, rinsing, drying)? 1  Help from another person to put on and taking off regular upper body clothing? 1  Help from another person to put on and taking off regular lower body clothing? 1  6 Click Score 10  Progressive Mobility  What is the highest level of mobility based on the mobility assessment? Level 1 (Bedfast) - Unable to balance while sitting on edge of bed  OT Goal Progression  Progress towards OT goals Progressing toward goals  Acute Rehab OT Goals  Patient Stated Goal home  OT Goal Formulation With patient  Time For Goal Achievement 11/18/23  Potential to Achieve Goals Good  OT Time Calculation  OT Start Time (ACUTE ONLY) 1027  OT Stop Time (ACUTE ONLY) 1140  OT Time Calculation (min) 73 min  OT General Charges  $OT Visit 1 Visit  OT Treatments  $Therapeutic Activity 23-37 mins  $Orthotics  Fit/Training 38-52 mins  $ Splint materials basic 1 Supply  $ OT Supplies 1 Supply   Kreg Sink, OT/L   Acute OT Clinical Specialist Acute Rehabilitation  Services Pager [phone] Office [phone]

## 2023-11-04 NOTE — Progress Notes (Signed)
 PROGRESS NOTE    Kathryn Beck  FMW:[id number] DOB: 1955-12-30 DOA: 10/29/2023 PCP: Antonetta Rollene BRAVO, MD    Brief Narrative:   68 year old female with a history of newly diagnosed atrial fib on apixaban , PAD  (s/p R BKA in 05/2022, prior lithotripsy and stenting of the left superficial femoral and popliteal artery in 06/2022 and ultimately L BKA in 09/2022), HTN, HLD, Type 2 DM, bilateral adrenal adenoma (followed by Endocrinology), chronic HFpEF, aortic stenosis, OSA, ESRD (MWF), history of TIA's, history of recurrent chest pain  (cath in 12/2018 showing normal coronary arteries) presenting with left hand pain and blisters.    Now s/p L hand and forearm I&D.  ID c/s for abx.      Assessment & Plan:  Principal Problem:   Abscess of left hand Active Problems:   Left hand pain    Left hand infection with concerns of abscess requiring I&D MSSA infection - CT with blistering and marked subcutaneous edema with stranding along the dorsal hand extending to the underlying extensor digitorum tendons with numerous scattered foci of soft tissue gas.  Seen by infectious disease, recommending cefazolin  renally dosed.  Anticipate 2-3-week treatment. - Wound care management per orthopedic: daily dressing changes, with Vashe if possible.    Paroxysmal atrial fibrillation  RVR -Currently on metoprolol .  Amiodarone  stopped.  Continue Eliquis    Acute Metabolic Encephalopathy Resolved, suspect related to IV fentanyl   Follow lactic acid wnl, VBG (no hypercarbia), ammonia wnl,  b12 elevated, folate - wnl Delirium precautions Additional w/u if not improving   ESRD  Hyperkalemia - volume per renal  - on T/Th/S/Su torsemide  - hyperkalemia management per renal    Essential hypertension - Continue metoprolol  succinate (non dialysis days) and amlodipine    Anxiety/depression - Cymbalta , sertraline , buspar    Diabetes mellitus type 2 with hyperglycemia - NovoLog  sliding scale - Reduced dose of  semaglutide  - 10/27/2023 hemoglobin A1c 6.7   PAD Status post bilateral BKA - s/p Stent, left superficial femoral and popliteal artery 07/21/22 ([facility]) - Continue aspirin , heparin  with encephalopathy        DVT prophylaxis: heparin  Code Status: full Family Communication: daughter updated.  Disposition:  Plans for dc tomorrow.    Subjective:  Doing ok, no complaints.  OT making her splint while I was in the room.   Examination:  General exam: Appears calm and comfortable  Respiratory system: Clear to auscultation. Respiratory effort normal. Cardiovascular system: S1 & S2 heard, RRR. No JVD, murmurs, rubs, gallops or clicks. No pedal edema. Gastrointestinal system: Abdomen is nondistended, soft and nontender. No organomegaly or masses felt. Normal bowel sounds heard. Central nervous system: Alert and oriented x 2-3. No focal neurological deficits. Extremities: Symmetric 5 x 5 power. Skin: No rashes, lesions or ulcers Psychiatry: Judgement and insight appear normal. Mood & affect appropriate. LUE dressing in place.                Diet Orders (From admission, onward)     Start     Ordered   10/30/23 0840  Diet renal with fluid restriction Fluid restriction: 1200 mL Fluid; Room service appropriate? Yes; Fluid consistency: Thin  Diet effective now       Question Answer Comment  Fluid restriction: 1200 mL Fluid   Room service appropriate? Yes   Fluid consistency: Thin      10/30/23 0839            Objective: Vitals:   11/04/23 0753 11/04/23 0944 11/04/23 1140 11/04/23 1148  BP: (!) 169/75 (!) 165/75 (!) 181/79 (!) 181/79  Pulse: 73 81  77  Resp: 13   20  Temp: 98.1 F (36.7 C)   98.5 F (36.9 C)  TempSrc: Oral   Oral  SpO2: 98%   97%  Weight:      Height:        Intake/Output Summary (Last 24 hours) at 11/04/2023 1258 Last data filed at 11/03/2023 1559 Gross per 24 hour  Intake --  Output 2000 ml  Net -2000 ml   Filed Weights   11/01/23 0746  11/01/23 1145 11/03/23 1214  Weight: 74.1 kg 74.5 kg 76.7 kg    Scheduled Meds:  (feeding supplement) PROSource Plus  30 mL Oral BID BM   acetaminophen   1,000 mg Oral Q8H   amLODipine   5 mg Oral Daily   apixaban   5 mg Oral BID   aspirin   81 mg Oral Daily   busPIRone   5 mg Oral BID   calcitRIOL   1 mcg Oral Q M,W,F-HD   Chlorhexidine  Gluconate Cloth  6 each Topical Q0600   Chlorhexidine  Gluconate Cloth  6 each Topical Q0600   cinacalcet   90 mg Oral Q M,W,F-HD   [START ON 11/05/2023] darbepoetin (ARANESP ) injection - DIALYSIS  40 mcg Subcutaneous Q Fri-1800   DULoxetine   60 mg Oral Daily   ezetimibe   10 mg Oral Daily   insulin  aspart  0-5 Units Subcutaneous QHS   insulin  aspart  0-9 Units Subcutaneous TID WC   metoprolol  succinate  50 mg Oral Q T,Th,S,Su   multivitamin  1 tablet Oral Daily   polyethylene glycol  17 g Oral BID   sertraline   50 mg Oral Daily   sucroferric oxyhydroxide  1,000 mg Oral TID WC   torsemide   100 mg Oral Q T,Th,S,Su   Continuous Infusions:   ceFAZolin  (ANCEF ) IV 2 g (11/03/23 1851)    Nutritional status     Body mass index is 28.14 kg/m.  Data Reviewed:   CBC: Recent Labs  Lab 10/29/23 1029 10/30/23 0432 10/31/23 0439 11/01/23 0537 11/02/23 0529 11/03/23 0357 11/04/23 0436  WBC 19.3*   < > 22.5* 20.1* 18.3* 16.6* 17.5*  NEUTROABS 16.5*  --  19.5* 16.9*  --  13.6*  --   HGB 9.9*   < > 9.6* 9.7* 10.1* 10.4* 11.0*  HCT 32.8*   < > 30.0* 30.1* 31.3* 32.8* 34.4*  MCV 78.3*   < > 75.8* 75.3* 75.1* 74.7* 75.1*  PLT 381   < > 417* 430* 477* 451* 433*   < > = values in this interval not displayed.   Basic Metabolic Panel: Recent Labs  Lab 10/31/23 0439 11/01/23 0537 11/02/23 0529 11/03/23 0357 11/04/23 0436  NA 142 141 138 140 138  K 5.4* 5.1 4.1 4.3 4.4  CL 104 103 97* 98 98  CO2 22 22 24 26 26   GLUCOSE 120* 122* 146* 149* 148*  BUN 58* 75* 45* 58* 38*  CREATININE 5.84* 6.75* 4.42* 5.52* 3.89*  CALCIUM  9.3 9.1 8.5* 8.9 9.0  MG  --   2.1 1.9 2.1 1.9  PHOS 6.5* 6.4* 5.2* 6.1* 4.2   GFR: Estimated Creatinine Clearance: 14.4 mL/min (A) (by C-G formula based on SCr of 3.89 mg/dL (H)). Liver Function Tests: Recent Labs  Lab 10/29/23 1029 10/30/23 0432 10/31/23 0439 11/01/23 0537 11/02/23 0529 11/03/23 0357  AST 22  --   --  18 18 16   ALT 33  --   --  25 25  20  ALKPHOS 136*  --   --  111 118 114  BILITOT 0.6  --   --  0.9 0.9 0.4  PROT 6.8  --   --  5.9* 6.0* 5.8*  ALBUMIN  2.7* 2.2* 2.1* 1.9* 1.9* 1.9*   No results for input(s): LIPASE, AMYLASE in the last 168 hours. Recent Labs  Lab 11/01/23 1314  AMMONIA 27   Coagulation Profile: No results for input(s): INR, PROTIME in the last 168 hours. Cardiac Enzymes: No results for input(s): CKTOTAL, CKMB, CKMBINDEX, TROPONINI in the last 168 hours. BNP (last 3 results) No results for input(s): PROBNP in the last 8760 hours. HbA1C: No results for input(s): HGBA1C in the last 72 hours. CBG: Recent Labs  Lab 11/03/23 0807 11/03/23 1657 11/03/23 2107 11/04/23 0751 11/04/23 1147  GLUCAP 152* 142* 204* 158* 203*   Lipid Profile: No results for input(s): CHOL, HDL, LDLCALC, TRIG, CHOLHDL, LDLDIRECT in the last 72 hours. Thyroid  Function Tests: Recent Labs    11/01/23 1314  TSH 0.393   Anemia Panel: Recent Labs    11/01/23 1314  VITAMINB12 1,172*  FOLATE 32.4   Sepsis Labs: Recent Labs  Lab 10/29/23 1029 11/01/23 1314  LATICACIDVEN 1.9 1.6    Recent Results (from the past 240 hours)  MRSA Next Gen by PCR, Nasal     Status: None   Collection Time: 10/27/23  1:43 PM   Specimen: Nasal Mucosa; Nasal Swab  Result Value Ref Range Status   MRSA by PCR Next Gen NOT DETECTED NOT DETECTED Final    Comment: (NOTE) The GeneXpert MRSA Assay (FDA approved for NASAL specimens only), is one component of a comprehensive MRSA colonization surveillance program. It is not intended to diagnose MRSA infection nor to guide or  monitor treatment for MRSA infections. Test performance is not FDA approved in patients less than 35 years old. Performed at [facility], [address] [place], [place] [address]   Culture, blood (Routine X 2) w Reflex to ID Panel     Status: None   Collection Time: 10/27/23  6:59 PM   Specimen: BLOOD  Result Value Ref Range Status   Specimen Description BLOOD BLOOD RIGHT HAND  Final   Special Requests   Final    AEROBIC BOTTLE ONLY Blood Culture results may not be optimal due to an inadequate volume of blood received in culture bottles   Culture   Final    NO GROWTH 5 DAYS Performed at [facility], [address] [place], [place] [address]    Report Status 11/01/2023 FINAL  Final  Culture, blood (Routine X 2) w Reflex to ID Panel     Status: None   Collection Time: 10/27/23  6:59 PM   Specimen: BLOOD  Result Value Ref Range Status   Specimen Description BLOOD BLOOD RIGHT HAND  Final   Special Requests   Final    AEROBIC BOTTLE ONLY Blood Culture results may not be optimal due to an inadequate volume of blood received in culture bottles   Culture   Final    NO GROWTH 5 DAYS Performed at [facility], [address], [place], [place] [address]    Report Status 11/01/2023 FINAL  Final  Blood culture (routine x 2)     Status: None   Collection Time: 10/29/23 10:29 AM   Specimen: BLOOD  Result Value Ref Range Status   Specimen Description BLOOD BLOOD RIGHT ARM RAC  Final   Special Requests   Final  BOTTLES DRAWN AEROBIC AND ANAEROBIC Blood Culture adequate volume   Culture   Final    NO GROWTH 5 DAYS Performed at [facility], [address], [place], [place] [address]    Report Status 11/03/2023 FINAL  Final  Blood culture (routine x 2)     Status: None   Collection Time: 10/29/23 10:29 AM   Specimen: BLOOD  Result Value Ref Range Status   Specimen Description BLOOD BLOOD RIGHT ARM LOWER ARM  Final   Special Requests   Final    BOTTLES DRAWN AEROBIC AND ANAEROBIC Blood  Culture adequate volume   Culture   Final    NO GROWTH 5 DAYS Performed at [facility], [address] [place], [place] [address]    Report Status 11/03/2023 FINAL  Final  Surgical PCR screen     Status: Abnormal   Collection Time: 10/30/23  5:53 AM   Specimen: Nasal Mucosa; Nasal Swab  Result Value Ref Range Status   MRSA, PCR NEGATIVE NEGATIVE Final   Staphylococcus aureus POSITIVE (A) NEGATIVE Final    Comment: (NOTE) The Xpert SA Assay (FDA approved for NASAL specimens in patients 49 years of age and older), is one component of a comprehensive surveillance program. It is not intended to diagnose infection nor to guide or monitor treatment. Performed at [facility], [address] [place], [place] [address]   Aerobic/Anaerobic Culture w Gram Stain (surgical/deep wound)     Status: None (Preliminary result)   Collection Time: 10/30/23  8:09 AM   Specimen: Wound; Body Fluid  Result Value Ref Range Status   Specimen Description WOUND  Final   Special Requests LEFT HAND FLUID COLLECTION  Final   Gram Stain   Final    RARE WBC PRESENT, PREDOMINANTLY PMN ABUNDANT GRAM POSITIVE COCCI Performed at [facility], [address] [place], [place] [address]    Culture   Final    MODERATE STAPHYLOCOCCUS AUREUS NO ANAEROBES ISOLATED; CULTURE IN PROGRESS FOR 5 DAYS    Report Status PENDING  Incomplete   Organism ID, Bacteria STAPHYLOCOCCUS AUREUS  Final      Susceptibility   Staphylococcus aureus - MIC*    CIPROFLOXACIN  >=8 RESISTANT Resistant     ERYTHROMYCIN >=8 RESISTANT Resistant     GENTAMICIN  >=16 RESISTANT Resistant     OXACILLIN 0.5 SENSITIVE Sensitive     TETRACYCLINE >=16 RESISTANT Resistant     VANCOMYCIN  <=0.5 SENSITIVE Sensitive     TRIMETH /SULFA  >=320 RESISTANT Resistant     CLINDAMYCIN  >=8 RESISTANT Resistant     RIFAMPIN <=0.5 SENSITIVE Sensitive     Inducible Clindamycin  NEGATIVE Sensitive     LINEZOLID 2 SENSITIVE Sensitive     * MODERATE  STAPHYLOCOCCUS AUREUS         Radiology Studies: No results found.         LOS: 6 days   Time spent= 35 mins    Burgess JAYSON Dare, MD [facility]  If 7PM-7AM, please contact night-coverage  11/04/2023, 12:58 PM

## 2023-11-04 NOTE — Progress Notes (Signed)
 OT Note Pt seen for splint check. Tolerating without issues. Written HEP provided and reviewed with pt however she will need assistance from caregiver. [employer] HEP tested to daughter's phone. Will follow up tomorrow.    11/04/23 1621  OT Visit Information  Last OT Received On 11/04/23  Assistance Needed +2  History of Present Illness Pt is a 68 y.o. female presenting with new AFIB, acute metabolic encephalopathy, and L hand abscess s/p I&D with drain placement 8/9. 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 PMH includes: bilateral BKA, anemia, arthritis, CHF, ESRD on HD MWF, HTN, memory loss, obesity, OSA, stroke, HTN, and DM II.  Precautions  Precautions Fall  Recall of Precautions/Restrictions Impaired  Required Braces or Orthoses Splint/Cast  Splint/Cast L resting hand splint  Restrictions  LUE Weight Bearing Per Provider Order NWB  Pain Assessment  Pain Assessment Faces  Faces Pain Scale 2  Pain Location L hand  Pain Descriptors / Indicators Grimacing  Pain Intervention(s) Limited activity within patient's tolerance  Exercises  Exercises Other exercises (reviewed [employer] HEP with pt; will need assistance form daughter; [employer] HEP texted to daughter's phone and written copy left in room)  OT - End of Session  Activity Tolerance Patient tolerated treatment well  Patient left in bed;with call bell/phone within reach;with family/visitor present  Nurse Communication Other (comment)  OT Assessment/Plan  OT Visit Diagnosis Pain;Muscle weakness (generalized) (M62.81);Other abnormalities of gait and mobility (R26.89)  Pain - Right/Left Left  Pain - part of body Hand;Arm  Follow Up Recommendations Home health OT  Patient can return home with the following Two people to help with walking and/or transfers;Two people to help with bathing/dressing/bathroom;Assistance with cooking/housework;Assistance with feeding;Direct supervision/assist for medications management;Direct  supervision/assist for financial management;Assist for transportation;Help with stairs or ramp for entrance;Supervision due to cognitive status  OT Equipment Hospital bed  AM-PAC OT 6 Clicks Daily Activity Outcome Measure (Version 2)  Help from another person eating meals? 3  Help from another person taking care of personal grooming? 3  Help from another person toileting, which includes using toliet, bedpan, or urinal? 1  Help from another person bathing (including washing, rinsing, drying)? 1  Help from another person to put on and taking off regular upper body clothing? 1  Help from another person to put on and taking off regular lower body clothing? 1  6 Click Score 10  Progressive Mobility  What is the highest level of mobility based on the mobility assessment? Level 1 (Bedfast) - Unable to balance while sitting on edge of bed  OT Goal Progression  Progress towards OT goals Progressing toward goals  Acute Rehab OT Goals  Patient Stated Goal home  OT Goal Formulation With patient  Time For Goal Achievement 11/18/23  Potential to Achieve Goals Good  OT Time Calculation  OT Start Time (ACUTE ONLY) 1446  OT Stop Time (ACUTE ONLY) 1501  OT Time Calculation (min) 15 min  OT General Charges  $OT Visit 1 Visit  OT Treatments  $Orthotics/Prosthetics Check 8-22 mins   Kreg Sink, OT/L   Acute OT Clinical Specialist Acute Rehabilitation Services Pager [phone] Office [phone]

## 2023-11-04 NOTE — Progress Notes (Deleted)
 [facility] Assessment:    Date:  11/04/2023   ID:  Kathryn Beck, DOB 25-May-1955, MRN [id number]  PCP:  Antonetta Rollene BRAVO, MD  Cardiologist:  Jayson Sierras, MD  Nephrologist:  Referring MD: Antonetta Rollene BRAVO, MD   CC: Hypertension  History of Present Illness:    Kathryn Beck is a 69 y.o. female with a hx of hypertension, ESRD on HD, DM2, AS, chronic diastolic heart failure, sleep apnea, PAD s/p R BKA 05/29/22, CVA, HLD with statin intolerance  here to follow up in the [facility]. She follows with Dr. Sierras.   Prior cardiac catheterization October 2020 with normal coronary arteries. Echo 01/17/19 normal LVEF 60-65%, moderate LVH, impaired diastolic function, RV normal, mild MR.  Referred by her PCP, Dr. Antonetta, for uncontrolled hypertension as well as episodes of hypotension during HD. Established with [facility] 04/16/22. Kathryn Beck was diagnosed with hypertension in her 30s. At initial visit noted to be taking Clonidine  daily, increased to BID. She was recommended for carotid duplex due to prior stenosis by CT 2019 and echocardiogram to assess LVH. She was referred to PREP exercise program.   She was seen 05/11/22 by PCP team with BP 105/67 after admission 04/2022 with normal or low normal  BP recommended to stop Amlodipine , Clonidine . At visit with PCP, recommended to stop Amlodipine , Clonidine , Imdur . Recommended to discuss stopping Lasix  with nephrology team.  Multiple admissions since that time. 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 10/2022 acute respiratory failure with hypoxia. 11/2022 admitted after fall with syncope workup revealing stable echo/carotids. No acute CVA and MRI with chronic findings. Of note BP control during all of these admissions was suboptimal.   Note from 05/12/22 with discontinuation of Amlodipine ,  Clonidine , Imdur  due to BP 105/67.   Presents today for follow up with her husband. She waits to take all of her medications until after dialysis. Reports dialysis going well. Reports BP has been good but cannot recall specific readings. Feeling overall well. Working with prosthesis to try to move more.   Previous antihypertensives: ACE - anaphylaxis, swelling   Past Medical History:  Diagnosis Date   Acid reflux    Amputated left leg (HCC)    bilateral BKA   Anemia    Arthritis    Axillary masses    Soft tissue - status post excision   Back pain    CHF (congestive heart failure) (HCC)    COVID-19 virus infection 04/06/2019   Depression    End-stage renal disease (HCC)    M/W/F dialysis   Essential hypertension    Headache    years ago   History of blood transfusion    History of cardiac catheterization    Normal coronary arteries October 2020   History of claustrophobia    History of pneumonia 2019   Hypoxia 04/03/2019   Memory loss    Mixed hyperlipidemia    Obesity    Pancreatitis    Peritoneal dialysis catheter in place [facility])    Pneumonia due to COVID-19 virus 04/02/2019   Sleep apnea    Noncompliant with CPAP   Stroke (HCC)    mini stroke   Type 2 diabetes mellitus (HCC)     Past Surgical History:  Procedure Laterality Date   ABDOMINAL AORTOGRAM W/LOWER EXTREMITY N/A 04/30/2022   Procedure: ABDOMINAL AORTOGRAM W/LOWER EXTREMITY;  Surgeon: Magda,  Debby SAILOR, MD;  [facility];  Service: Cardiovascular;  Laterality: N/A;   ABDOMINAL AORTOGRAM W/LOWER EXTREMITY N/A 07/21/2022   Procedure: ABDOMINAL AORTOGRAM W/LOWER EXTREMITY;  Surgeon: Serene Gaile ORN, MD;  Location: [facility];  Service: Cardiovascular;  Laterality: N/A;   ABDOMINAL HYSTERECTOMY     ACHILLES TENDON LENGTHENING  08/15/2022   Procedure: ACHILLES TENDON LENGTHENING;  Surgeon: Silva Juliene SAUNDERS, DPM;  Location: [facility] OR;  Service: Podiatry;;   AMPUTATION Right 05/29/2022    Procedure: RIGHT BELOW THE KNEE AMPUTATION;  Surgeon: Harden Jerona GAILS, MD;  Location: [facility] OR;  Service: Orthopedics;  Laterality: Right;   AMPUTATION Left 09/04/2022   Procedure: AMPUTATION FOOT, serial irrigation;  Surgeon: Joya Stabs, DPM;  Location: [facility] OR;  Service: Podiatry;  Laterality: Left;  Surgical team to do block   AMPUTATION Left 10/07/2022   Procedure: LEFT BELOW KNEE AMPUTATION;  Surgeon: Harden Jerona GAILS, MD;  Location: [facility] OR;  Service: Orthopedics;  Laterality: Left;   AMPUTATION FINGER Left 09/29/2023   Procedure: AMPUTATION, FINGER;  Surgeon: Harden Jerona GAILS, MD;  Location: [facility] OR;  Service: Orthopedics;  Laterality: Left;  LEFT HAND LONG FINGER RAY AMPUTATION   AV FISTULA PLACEMENT Left 09/02/2017   Procedure: creation of left arm ARTERIOVENOUS (AV) FISTULA;  Surgeon: Serene Gaile ORN, MD;  Location: [facility] OR;  Service: Vascular;  Laterality: Left;   COLONOSCOPY  2008   Dr. Harvey: normal    COLONOSCOPY N/A 12/18/2016   Dr. Harvey: multiple tubular adenomas, internal hemorrhoids. Surveillance in 3 years    ESOPHAGEAL DILATION N/A 10/13/2015   Procedure: ESOPHAGEAL DILATION;  Surgeon: Claudis RAYMOND Rivet, MD;  [facility];  Service: Endoscopy;  Laterality: N/A;   ESOPHAGOGASTRODUODENOSCOPY N/A 10/13/2015   Dr. Rivet: chronic gastritis on path, no H.pylori. Empiric dilation    ESOPHAGOGASTRODUODENOSCOPY N/A 12/18/2016   Dr. Harvey: mild gastritis. BRAVO study revealed uncontrolled GERD. Dysphagia secondary to uncontrolled reflux   FOOT SURGERY Bilateral    nerve     INCISION AND DRAINAGE OF WOUND Left 10/30/2023   Procedure: IRRIGATION AND DEBRIDEMENT WOUND;  Surgeon: Arlinda Buster, MD;  [facility];  Service: Orthopedics;  Laterality: Left;  LEFT HAND WOUND   LEFT HEART CATH AND CORONARY ANGIOGRAPHY N/A 12/29/2018   Procedure: LEFT HEART CATH AND CORONARY ANGIOGRAPHY;  Surgeon: Dann Candyce RAMAN, MD;  Location: [facility] INVASIVE CV LAB;  Service: Cardiovascular;  Laterality:  N/A;   LOWER EXTREMITY ANGIOGRAPHY Right 05/04/2022   Procedure: Lower Extremity Angiography;  Surgeon: Lanis Fonda BRAVO, MD;  Location: [facility] INVASIVE CV LAB;  Service: Cardiovascular;  Laterality: Right;   LUNG BIOPSY     MASS EXCISION Right 01/09/2013   Procedure: EXCISION OF NEOPLASM OF RIGHT  AXILLA  AND EXCISION OF NEOPLASM OF LEFT AXILLA;  Surgeon: Oneil DELENA Budge, MD;  Location: [facility] ORS;  Service: General;  Laterality: Right;  procedure end @ 08:23   MYRINGOTOMY WITH TUBE PLACEMENT Bilateral 04/28/2017   Procedure: BILATERAL MYRINGOTOMY WITH TUBE PLACEMENT;  Surgeon: Karis Clunes, MD;  [facility];  Service: ENT;  Laterality: Bilateral;   PERIPHERAL VASCULAR BALLOON ANGIOPLASTY Right 05/04/2022   Procedure: PERIPHERAL VASCULAR BALLOON ANGIOPLASTY;  Surgeon: Lanis Fonda BRAVO, MD;  Location: [facility] INVASIVE CV LAB;  Service: Cardiovascular;  Laterality: Right;  PT   PERIPHERAL VASCULAR INTERVENTION Right 05/04/2022   Procedure: PERIPHERAL VASCULAR INTERVENTION;  Surgeon: Lanis Fonda BRAVO, MD;  Location: [facility] INVASIVE CV LAB;  Service: Cardiovascular;  Laterality: Right;  SFA  PERIPHERAL VASCULAR INTERVENTION Left 07/21/2022   Procedure: PERIPHERAL VASCULAR INTERVENTION;  Surgeon: Serene Gaile ORN, MD;  [facility];  Service: Cardiovascular;  Laterality: Left;   REVISION OF ARTERIOVENOUS GORETEX GRAFT Left 05/04/2018   Procedure: TRANSPOSITION OF CEPHALIC VEIN ARTERIOVENOUS FISTULA LEFT ARM;  Surgeon: Oris Krystal FALCON, MD;  [facility];  Service: Vascular;  Laterality: Left;   SAVORY DILATION N/A 12/18/2016   Procedure: SAVORY DILATION;  Surgeon: Harvey Margo CROME, MD;  [facility];  Service: Endoscopy;  Laterality: N/A;   TRANSMETATARSAL AMPUTATION Left 08/15/2022   Procedure: TRANSMETATARSAL AMPUTATION;  Surgeon: Silva Juliene SAUNDERS, DPM;  Location[facility];  Service: Podiatry;  Laterality: Left;    Current Medications: No outpatient medications have been marked as taking for the  11/04/23 encounter (Appointment) with Vannie Reche RAMAN, NP.     Allergies:   Ace inhibitors, Penicillins, Statins, and Albuterol    Social History   Socioeconomic History   Marital status: Married    Spouse name: Eduardo Wurth   Number of children: 2   Years of education: 12   Highest education level: Associate degree: occupational, Scientist, product/process development, or vocational program  Occupational History   Occupation: retired   Tobacco Use   Smoking status: Never    Passive exposure: Never   Smokeless tobacco: Never   Tobacco comments:    Verified by Daughter, [name]  Vaping Use   Vaping status: Never Used  Substance and Sexual Activity   Alcohol use: No   Drug use: No   Sexual activity: Not Currently    Partners: Male  Other Topics Concern   Not on file  Social History Narrative   Lives alone with husband    Right handed   Caffeine-1/2 daily   Social Drivers of Health   Financial Resource Strain: Low Risk  (05/03/2023)   Overall Financial Resource Strain (CARDIA)    Difficulty of Paying Living Expenses: Not hard at all  Food Insecurity: No Food Insecurity (10/29/2023)   Hunger Vital Sign    Worried About Running Out of Food in the Last Year: Never true    Ran Out of Food in the Last Year: Never true  Transportation Needs: No Transportation Needs (10/29/2023)   PRAPARE - Administrator, Civil Service (Medical): No    Lack of Transportation (Non-Medical): No  Physical Activity: Inactive (05/03/2023)   Exercise Vital Sign    Days of Exercise per Week: 0 days    Minutes of Exercise per Session: 0 min  Stress: No Stress Concern Present (05/03/2023)   [employer] of Occupational Health - Occupational Stress Questionnaire    Feeling of Stress : Not at all  Social Connections: Moderately Isolated (10/29/2023)   Social Connection and Isolation Panel    Frequency of Communication with Friends and Family: More than three times a week    Frequency of Social Gatherings  with Friends and Family: More than three times a week    Attends Religious Services: Never    Database administrator or Organizations: No    Attends Engineer, structural: Never    Marital Status: Married     Family History: The patient's family history includes Arthritis in her father; Breast cancer in her sister; Hypercholesterolemia in her father and sister; Hypertension in her father, sister, and sister. There is no history of Colon cancer or Colon polyps.  ROS:   Please see the history of present illness.  All other systems reviewed and are negative.  EKGs/Labs/Other Studies Reviewed:    EKG:  EKG is  ordered today.  The ekg ordered today demonstrates NSR 80 bpm with no acute ST/T wave changes.  Recent Labs: 12/28/2022: B Natriuretic Peptide 2,982.0 11/01/2023: TSH 0.393 11/03/2023: ALT 20 11/04/2023: BUN 38; Creatinine, Ser 3.89; Hemoglobin 11.0; Magnesium  1.9; Platelets 433; Potassium 4.4; Sodium 138   Recent Lipid Panel    Component Value Date/Time   CHOL 123 12/18/2022 0413   TRIG 105 12/18/2022 0413   HDL 41 12/18/2022 0413   CHOLHDL 3.0 12/18/2022 0413   VLDL 21 12/18/2022 0413   LDLCALC 61 12/18/2022 0413   LDLCALC 109 (H) 11/12/2017 0936   LDLDIRECT 120.0 11/21/2020 1402    Physical Exam:   VS:  There were no vitals taken for this visit. , BMI There is no height or weight on file to calculate BMI. GENERAL:  Well appearing HEENT: Pupils equal round and reactive, fundi not visualized, oral mucosa unremarkable NECK:  No jugular venous distention, waveform within normal limits, carotid upstroke brisk and symmetric, no bruits, no thyromegaly LYMPHATICS:  No cervical adenopathy LUNGS:  Clear to auscultation bilaterally HEART:  RRR.  PMI not displaced or sustained,S1 and S2 within normal limits, no S3, no S4, no clicks, no rubs, no murmurs ABD:  Flat, positive bowel sounds normal in frequency in pitch, no bruits, no rebound, no guarding, no midline pulsatile  mass, no hepatomegaly, no splenomegaly EXT:  2 plus pulses throughout, no edema, no cyanosis no clubbing SKIN:  No rashes no nodules NEURO:  Cranial nerves II through XII grossly intact, motor grossly intact throughout PSYCH:  Cognitively intact, oriented to person place and time   ASSESSMENT/PLAN:    HTN - BP not at goal <130/80. Antihypertensive regimen previously decreased for hypotension. Increase to Amlodipine  10mg  every day. Continue Toprol  50mg  daily. Continue Torsemide  per nephrology on non-HD days, does still make urine.  Cushings/Adrenal workup previously completed by Dr. Trixie of endocrinology detailed below.   HFpEF / AS - Volume management per HD. Echo 11/2022 LVEF 50-55%, gr2DD, elevated LVEDP, RVSF mildly reduced, trivial MR, severe calcification of AV with mild AS/AI.  PVD - s/p bilateral BKA. Follows with ortho and VVS.  OSA - CPAP titration ordered 05/2022 not yet completed. Will reach out to pulmonology team for recommendations.   ESRD on HD - Follows with nephrology.   Dm2 - Follows with Dr. Vianne of endocrinology.  HLD - Did not tolerate statin due to elevated LFTs. Continue Zetia  10mg  QD. Aortic atherosclerosis by CT, consider disussion of PCSK9i at follow up. Not addressed at this clinic visit.   Bilateral adrenal adenoma - Followed by Dr. Trixie of endocrinology. MRI 2019 bilateral adrenal adenoma. 2019-2022 plasma catecholamines, metanephrines, aldosterone normal.   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 Prior workup for autonomous cortisol production. Pituitary MRI positive pituitary mass. Per Dr. Vianne hormone producing adenoma or Cushing's disease - future investigation difficult due to dialysis. Referred ot [facility] but has not had appt.   Screening for Secondary Hypertension:     04/16/2022    8:23 PM  Causes  Sleep Apnea Screened  Thyroid  Disease Screened  Pheochromocytoma Screened     - Comments per endocrinology  Cushing's  Syndrome Screened     - Comments per endocrinology  Coarctation of the Aorta Screened     - Comments carotid duplex ordered 04/16/22  Compliance Screened     - Comments mix of pill  pack and pill bottles makes compliance difficult to ascertain    Relevant Labs/Studies:    Latest Ref Rng & Units 11/04/2023    4:36 AM 11/03/2023    3:57 AM 11/02/2023    5:29 AM  Basic Labs  Sodium 135 - 145 mmol/L 138  140  138   Potassium 3.5 - 5.1 mmol/L 4.4  4.3  4.1   Creatinine 0.44 - 1.00 mg/dL 6.10  4.47  5.57        Latest Ref Rng & Units 11/01/2023    1:14 PM 10/27/2023   11:08 AM  Thyroid    TSH 0.350 - 4.500 uIU/mL 0.393  0.661        10/28/2021   10:40 AM 11/21/2020    2:02 PM 10/04/2018   12:07 PM 11/30/2017   10:53 AM  Renin/Aldosterone   Aldosterone CANCELED  1  2  3         Latest Ref Rng & Units 10/28/2021   10:40 AM 11/21/2020    2:02 PM 10/04/2018   12:07 PM 11/30/2017   10:53 AM  Metanephrines/Catecholamines   Epinephrine  pg/mL 31  <20  24  see note   Norepinephrine pg/mL 736  332  167  99   Dopamine pg/mL <10  <10  <10  see note   Metanephrines <=57 pg/mL <25  <25  <25  <25   Normetanephrines  <=148 pg/mL 74  56  38  71        Latest Ref Rng & Units 07/22/2021    8:29 AM  Cortisol  Cortisol  ug/dL 75.2            Disposition:    FU with MD/PharmD in *** mos   Medication Adjustments/Labs and Tests Ordered: Current medicines are reviewed at length with the patient today.  Concerns regarding medicines are outlined above.  No orders of the defined types were placed in this encounter.  No orders of the defined types were placed in this encounter.    Signed, Reche GORMAN Finder, NP  11/04/2023 8:20 AM    [facility]

## 2023-11-04 NOTE — TOC Progression Note (Addendum)
 Transition of Care [facility]) - Progression Note    Patient Details  Name: Kathryn Beck MRN: [id number] Date of Birth: 1956/01/13  [facility] CM/SW Contact  Graves-Bigelow, Erminio Deems, RN Phone Number: 11/04/2023, 9:57 AM  Clinical Narrative:  Case Manager received call from [facility] that family wants hospital bed delivered on Monday. No further needs identified at this time.    MD states patient will be ready for dc tomorrow and daughter is aware.    Patient will initially start with RN/OT/HH Aide and then if PT is needed Hedda will send that discipline to the home. No further needs at this time.    Expected Discharge Plan: Home w Home Health Services Barriers to Discharge: Continued Medical Work up  Expected Discharge Plan and Services In-house Referral: NA Discharge Planning Services: CM Consult Post Acute Care Choice: Home Health                   DME Arranged: Hospital bed DME Agency: [employer] Date DME Agency Contacted: 11/03/23 Time DME Agency Contacted: [phone] Representative spoke with at DME Agency: [facility] HH Arranged: Disease Management, RN, OT, Nurse's Aide HH Agency: [facility] Date [facility] Agency Contacted: 11/02/23 Time HH Agency Contacted: 1049 Representative spoke with at [facility] Agency: [facility]le[facility]   Social Drivers of Health (SDOH) Interventions SDOH Screenings   Food Insecurity: No Food Insecurity (10/29/2023)  Housing: Low Risk  (10/29/2023)  Transportation Needs: No Transportation Needs (10/29/2023)  Utilities: Not At Risk (10/29/2023)  Alcohol Screen: Low Risk  (05/03/2023)  Depression (PHQ2-9): High Risk (08/26/2023)  Financial Resource Strain: Low Risk  (05/03/2023)  Physical Activity: Inactive (05/03/2023)  Social Connections: Moderately Isolated (10/29/2023)  Stress: No Stress Concern Present (05/03/2023)  Tobacco Use: Low Risk  (10/30/2023)  Health Literacy: Adequate Health Literacy (05/03/2023)  Recent Concern: Health Literacy -  Inadequate Health Literacy (02/08/2023)    Readmission Risk Interventions    12/18/2022    3:14 PM 05/01/2022    3:54 PM  Readmission Risk Prevention Plan  Transportation Screening Complete Complete  HRI or Home Care Consult  Complete  Social Work Consult for Recovery Care Planning/Counseling  --  Palliative Care Screening  Not Applicable  Medication Review Oceanographer) Complete Complete  PCP or Specialist appointment within 3-5 days of discharge Not Complete   HRI or Home Care Consult Complete   SW Recovery Care/Counseling Consult Complete   Palliative Care Screening Not Applicable   Skilled Nursing Facility Not Applicable

## 2023-11-04 NOTE — Progress Notes (Signed)
 [facility] Progress Note   Subjective:    Patient seen and examined in her room this morning. She had HD yesterday and reports that she tolerated it well. They were able to take 2L off during HD. BP on the higher side today. Plan for more UF during her next HD. She is not on amio gtt anymore and remains in sinus rhythm. No dyspnea or CP reported. Next HD 8/14  Objective Vitals:   11/03/23 2307 11/04/23 0338 11/04/23 0357 11/04/23 0753  BP: 138/69 (!) 170/75  (!) 169/75  Pulse: 74  72 73  Resp: 16  15 13   Temp: 98.3 F (36.8 C)  98.1 F (36.7 C) 98.1 F (36.7 C)  TempSrc: Axillary  Oral Oral  SpO2: 97%  100% 98%  Weight:      Height:       Physical Exam General: Awake; NAD Heart: S1 and S2; RRR; No murmurs Lungs: Clear throughout Abdomen: Soft and non-tender Extremities: B/l BKAs; No edema b/l stumps Dialysis Access:  L AVF   Filed Weights   11/01/23 0746 11/01/23 1145 11/03/23 1214  Weight: 74.1 kg 74.5 kg 76.7 kg    Intake/Output Summary (Last 24 hours) at 11/04/2023 0905 Last data filed at 11/03/2023 1559 Gross per 24 hour  Intake --  Output 2000 ml  Net -2000 ml     Additional Objective Labs: Basic Metabolic Panel: Recent Labs  Lab 11/02/23 0529 11/03/23 0357 11/04/23 0436  NA 138 140 138  K 4.1 4.3 4.4  CL 97* 98 98  CO2 24 26 26   GLUCOSE 146* 149* 148*  BUN 45* 58* 38*  CREATININE 4.42* 5.52* 3.89*  CALCIUM  8.5* 8.9 9.0  PHOS 5.2* 6.1* 4.2   Liver Function Tests: Recent Labs  Lab 11/01/23 0537 11/02/23 0529 11/03/23 0357  AST 18 18 16   ALT 25 25 20   ALKPHOS 111 118 114  BILITOT 0.9 0.9 0.4  PROT 5.9* 6.0* 5.8*  ALBUMIN  1.9* 1.9* 1.9*   No results for input(s): LIPASE, AMYLASE in the last 168 hours. CBC: Recent Labs  Lab 10/31/23 0439 11/01/23 0537 11/02/23 0529 11/03/23 0357 11/04/23 0436  WBC 22.5* 20.1* 18.3* 16.6* 17.5*  NEUTROABS 19.5* 16.9*  --  13.6*  --   HGB 9.6* 9.7* 10.1* 10.4* 11.0*  HCT 30.0*  30.1* 31.3* 32.8* 34.4*  MCV 75.8* 75.3* 75.1* 74.7* 75.1*  PLT 417* 430* 477* 451* 433*    Medications:   ceFAZolin  (ANCEF ) IV 2 g (11/03/23 1851)    (feeding supplement) PROSource Plus  30 mL Oral BID BM   acetaminophen   1,000 mg Oral Q8H   apixaban   5 mg Oral BID   aspirin   81 mg Oral Daily   busPIRone   5 mg Oral BID   calcitRIOL   1 mcg Oral Q M,W,F-HD   Chlorhexidine  Gluconate Cloth  6 each Topical Q0600   Chlorhexidine  Gluconate Cloth  6 each Topical Q0600   cinacalcet   90 mg Oral Q M,W,F-HD   [START ON 11/05/2023] darbepoetin (ARANESP ) injection - DIALYSIS  40 mcg Subcutaneous Q Fri-1800   DULoxetine   60 mg Oral Daily   ezetimibe   10 mg Oral Daily   insulin  aspart  0-5 Units Subcutaneous QHS   insulin  aspart  0-9 Units Subcutaneous TID WC   metoprolol  succinate  50 mg Oral Q T,Th,S,Su   multivitamin  1 tablet Oral Daily   mupirocin  ointment  1 Application Nasal BID   polyethylene glycol  17 g Oral BID  sertraline   50 mg Oral Daily   sucroferric oxyhydroxide  1,000 mg Oral TID WC   torsemide   100 mg Oral Q T,Th,S,Su    Dialysis Orders: [facility]  - MWF 3.5hrs BFR 400 DFR 500  2K/2.5Ca  EDW 73.5kg Heparin  bolus 1000 units Calcitriol  with HD - last dose 10/29/23 Sensipar  90mg  with HD - last dose 10/29/23 Micera 50mcg Q2wks - last dose 10/18/23 [name]  1000mg  TID with meals  Assessment/Plan: Left hand pain with swelling - L hand CT showed concern for soft tissue infection. S/p L hand I&D with drain placement, per Vanc, Cefepime , and Flagyl . Blood culture resulted with G+ with moderate staph aureus. On IV antibiotics for now. WBC 17.5 today - slight upward trend. She reports that it is feeling better.  ESRD -  on HD MWF. HD yesterday with 2L UF. Plan for next HD 8/15 per her usual schedule. K+ now 4.4. Above her EDW post HD. Will plan for more UF tomorrow. Will montior.  Hypertension/volume  - Bps higher today. Start Amlodipine  5 mg today. I suspect that it will  improve with increased UF. Continue home medications. Anemia of CKD - Hgb 10.4. Next ESA due 8/15 Secondary Hyperparathyroidism -  Added calcitriol  and continue Sensipar . On binders. Nutrition - Renal diet with fluid restriction. Continue with protein supplements.   Belvie Och, NP [facility] 11/04/2023,9:05 AM  LOS: 6 days

## 2023-11-05 DIAGNOSIS — L02512 Cutaneous abscess of left hand: Secondary | ICD-10-CM | POA: Diagnosis not present

## 2023-11-05 LAB — BASIC METABOLIC PANEL WITH GFR
Anion gap: 13 (ref 5–15)
BUN: 56 mg/dL — ABNORMAL HIGH (ref 8–23)
CO2: 28 mmol/L (ref 22–32)
Calcium: 8.7 mg/dL — ABNORMAL LOW (ref 8.9–10.3)
Chloride: 99 mmol/L (ref 98–111)
Creatinine, Ser: 5.18 mg/dL — ABNORMAL HIGH (ref 0.44–1.00)
GFR, Estimated: 9 mL/min — ABNORMAL LOW (ref >60)
Glucose, Bld: 115 mg/dL — ABNORMAL HIGH (ref 70–99)
Potassium: 4.5 mmol/L (ref 3.5–5.1)
Sodium: 140 mmol/L (ref 135–145)

## 2023-11-05 LAB — CBC
HCT: 31.8 % — ABNORMAL LOW (ref 36.0–46.0)
Hemoglobin: 10.2 g/dL — ABNORMAL LOW (ref 12.0–15.0)
MCH: 24 pg — ABNORMAL LOW (ref 26.0–34.0)
MCHC: 32.1 g/dL (ref 30.0–36.0)
MCV: 74.8 fL — ABNORMAL LOW (ref 80.0–100.0)
Platelets: 401 K/uL — ABNORMAL HIGH (ref 150–400)
RBC: 4.25 MIL/uL (ref 3.87–5.11)
RDW: 18.7 % — ABNORMAL HIGH (ref 11.5–15.5)
WBC: 16.7 K/uL — ABNORMAL HIGH (ref 4.0–10.5)
nRBC: 0.2 % (ref 0.0–0.2)

## 2023-11-05 LAB — GLUCOSE, CAPILLARY
Glucose-Capillary: 130 mg/dL — ABNORMAL HIGH (ref 70–99)
Glucose-Capillary: 225 mg/dL — ABNORMAL HIGH (ref 70–99)
Glucose-Capillary: 323 mg/dL — ABNORMAL HIGH (ref 70–99)

## 2023-11-05 LAB — MAGNESIUM: Magnesium: 2 mg/dL (ref 1.7–2.4)

## 2023-11-05 MED ORDER — DARBEPOETIN ALFA 40 MCG/0.4ML IJ SOSY: 40.0000 ug | PREFILLED_SYRINGE | INTRAMUSCULAR | Status: DC

## 2023-11-05 MED ORDER — CALCITRIOL 0.5 MCG PO CAPS: 1.0000 ug | ORAL_CAPSULE | ORAL | Status: DC

## 2023-11-05 MED ORDER — CEFAZOLIN SODIUM-DEXTROSE 2-4 GM/100ML-% IV SOLN: 2.0000 g | INTRAVENOUS | Status: DC

## 2023-11-05 NOTE — Discharge Summary (Signed)
 Physician Discharge Summary  NEREYDA BOWLER FMW:[id number] DOB: Aug 24, 1955 DOA: 10/29/2023  PCP: Antonetta Rollene BRAVO, MD  Admit date: 10/29/2023 Discharge date: 11/05/2023  Admitted From: Home Disposition: Home  Recommendations for Outpatient Follow-up:  Follow up with PCP in 1-2 weeks Please obtain BMP/CBC in one week your next doctors visit.  3 weeks of IV antibiotics with hemodialysis Daily basic dressing change.  Use Vashe if possible Continue to keep the drain in her hand.  Outpatient follow-up with orthopedic   Discharge Condition: Stable CODE STATUS: Full code Diet recommendation: Heart healthy  Brief/Interim Summary: Brief Narrative:   68 year old female with a history of newly diagnosed atrial fib on apixaban , PAD  (s/p R BKA in 05/2022, prior lithotripsy and stenting of the left superficial femoral and popliteal artery in 06/2022 and ultimately L BKA in 09/2022), HTN, HLD, Type 2 DM, bilateral adrenal adenoma (followed by Endocrinology), chronic HFpEF, aortic stenosis, OSA, ESRD (MWF), history of TIA's, history of recurrent chest pain  (cath in 12/2018 showing normal coronary arteries) presenting with left hand pain and blisters.    Now s/p L hand and forearm I&D.  ID c/s for abx.   Discussed with orthopedic who plans on keeping the drain for now and following up outpatient.  ID and nephrology to arrange for outpatient antibiotics for next 3 weeks.  Hospital bed has been arranged.  Medically stable for discharge.    Assessment & Plan:  Principal Problem:   Abscess of left hand Active Problems:   Left hand pain    Left hand infection with concerns of abscess requiring I&D MSSA infection - CT with blistering and marked subcutaneous edema with stranding along the dorsal hand extending to the underlying extensor digitorum tendons with numerous scattered foci of soft tissue gas.  Seen by infectious disease, recommending cefazolin  renally dosed.  Anticipate 2-3-week  treatment. - Wound care management per orthopedic: daily dressing changes, with Vashe if possible.    Paroxysmal atrial fibrillation  RVR -Currently on metoprolol .  Amiodarone  stopped.  Continue Eliquis    Acute Metabolic Encephalopathy Resolved, suspect related to IV fentanyl   Follow lactic acid wnl, VBG (no hypercarbia), ammonia wnl,  b12 elevated, folate - wnl Delirium precautions Additional w/u if not improving   ESRD  Hyperkalemia - volume per renal  - on T/Th/S/Su torsemide  - hyperkalemia management per renal    Essential hypertension - Continue metoprolol  succinate (non dialysis days) and amlodipine    Anxiety/depression - Cymbalta , sertraline , buspar    Diabetes mellitus type 2 with hyperglycemia - NovoLog  sliding scale - Reduced dose of semaglutide  - 10/27/2023 hemoglobin A1c 6.7   PAD Status post bilateral BKA - s/p Stent, left superficial femoral and popliteal artery 07/21/22 ([facility]) - Continue aspirin , heparin  with encephalopathy        DVT prophylaxis: heparin  Code Status: full Family Communication: daughter updated.  Disposition:  Plans for dc today   Subjective:  Doing ok, no complaints.  Ok to go home.   Examination:  General exam: Appears calm and comfortable  Respiratory system: Clear to auscultation. Respiratory effort normal. Cardiovascular system: S1 & S2 heard, RRR. No JVD, murmurs, rubs, gallops or clicks. No pedal edema. Gastrointestinal system: Abdomen is nondistended, soft and nontender. No organomegaly or masses felt. Normal bowel sounds heard. Central nervous system: Alert and oriented x 2-3. No focal neurological deficits. Extremities: Symmetric 5 x 5 power. Skin: No rashes, lesions or ulcers Psychiatry: Judgement and insight appear normal. Mood & affect appropriate. LUE dressing in place.  Discharge Diagnoses:  Principal Problem:   Abscess of left hand Active Problems:   Left hand pain      Discharge Exam: Vitals:    11/05/23 0745 11/05/23 1231  BP: (!) 146/61 (!) 140/58  Pulse: 74   Resp: 19 18  Temp: 97.7 F (36.5 C) 97.6 F (36.4 C)  SpO2: 99%    Vitals:   11/04/23 2304 11/05/23 0501 11/05/23 0745 11/05/23 1231  BP: 134/60 (!) 147/63 (!) 146/61 (!) 140/58  Pulse: 68 68 74   Resp: 18 18 19 18   Temp: (!) 97.5 F (36.4 C) (!) 97.5 F (36.4 C) 97.7 F (36.5 C) 97.6 F (36.4 C)  TempSrc: Oral Oral Oral Oral  SpO2: 100% 100% 99%   Weight:      Height:          Discharge Instructions   Allergies as of 11/05/2023       Reactions   Ace Inhibitors Anaphylaxis, Swelling   Penicillins Itching, Swelling   Has tolerated cefazolin  on multiple occasions    Statins Other (See Comments)   elevated LFT's   Albuterol  Swelling        Medication List     STOP taking these medications    cefdinir  300 MG capsule Commonly known as: OMNICEF    doxycycline  100 MG tablet Commonly known as: VIBRA -TABS       TAKE these medications    acetaminophen  650 MG CR tablet Commonly known as: TYLENOL  Take 1,300 mg by mouth every 8 (eight) hours as needed for pain.   amLODipine  10 MG tablet Commonly known as: NORVASC  Take 0.5 tablets (5 mg total) by mouth daily.   apixaban  5 MG Tabs tablet Commonly known as: ELIQUIS  Take 1 tablet (5 mg total) by mouth 2 (two) times daily. For Afib   aspirin  81 MG chewable tablet Chew 2 tablets (162 mg total) by mouth daily. What changed: how much to take   busPIRone  5 MG tablet Commonly known as: BUSPAR  TAKE ONE TABLET BY MOUTH TWICE DAILY   calcitRIOL  0.5 MCG capsule Commonly known as: ROCALTROL  Take 2 capsules (1 mcg total) by mouth every Monday, Wednesday, and Friday with hemodialysis.   ceFAZolin  2-4 GM/100ML-% IVPB Commonly known as: ANCEF  Inject 100 mLs (2 g total) into the vein every Monday, Wednesday, and Friday with hemodialysis for 20 days.   cinacalcet  30 MG tablet Commonly known as: SENSIPAR  Take 3 tablets (90 mg total) by mouth 3  (three) times a week. What changed: when to take this   Darbepoetin Alfa  40 MCG/0.4ML Sosy injection Commonly known as: ARANESP  Inject 0.4 mLs (40 mcg total) into the skin every Friday at 6 PM.   DULoxetine  60 MG capsule Commonly known as: CYMBALTA  TAKE (1) CAPSULE BY MOUTH ONCE DAILY.   ezetimibe  10 MG tablet Commonly known as: ZETIA  Take 1 tablet (10 mg total) by mouth daily.   metoprolol  succinate 50 MG 24 hr tablet Commonly known as: TOPROL -XL TAKE (1) TABLET BY MOUTH DAILY WITH FOOD *TAKE AFTER DIALYSIS* What changed:  how much to take how to take this when to take this additional instructions   oxyCODONE -acetaminophen  5-325 MG tablet Commonly known as: PERCOCET/ROXICET Take 1 tablet by mouth every 6 (six) hours as needed.   Rena-Vite Rx 1 MG Tabs Take 1 tablet by mouth daily.   torsemide  100 MG tablet Commonly known as: DEMADEX  Take 1 tablet (100 mg total) by mouth every Tuesday, Thursday, Saturday, and Sunday.   Toujeo  Max SoloStar 300 UNIT/ML  Solostar Pen Generic drug: insulin  glargine (2 Unit Dial ) Inject 30 Units into the skin daily. May need to slowly increase the dose depending upon your blood sugar, follow-up with PCP   [name]  500 MG chewable tablet Generic drug: sucroferric oxyhydroxide Chew 1-2 tablets (500-1,000 mg total) by mouth See admin instructions. Take 1000mg  (2 tablets) by mouth with meals and 500mg  (1 tablet) with snacks               Durable Medical Equipment  (From admission, onward)           Start     Ordered   11/03/23 1302  For home use only DME Hospital bed  Once       Question Answer Comment  Length of Need Lifetime   Patient has (list medical condition): chronic HFpEF, aortic stenosis, L BKA   The above medical condition requires: Patient requires the ability to reposition frequently   Head must be elevated greater than: 45 degrees   Bed type Semi-electric   Trapeze Bar Yes   Support Surface: Gel Overlay       11/03/23 1307            Follow-up Information     [facility], [facility] Follow up.   Specialty: Home Health Services Why: Occupational Therapy, Registered Nurse, and Aide-office will contact the patient with visit times. Contact information: [address] [place] [address] [phone]         [employer] [facility] [employer] Follow up.   Why: Hospital Bed to be delivered to the home. Contact information: Located in: [employer] Address: [address], [place], [place] [address] Phone: [phone]        Luiz Channel, MD Follow up on 11/25/2023.   Specialty: Infectious Diseases Why: Hospital Discharge Follow Up 3:30 pm with Dr. Luiz Pass information: [address] [place] [address] [phone]         Arlinda Buster, MD. Schedule an appointment as soon as possible for a visit in 1 week(s).   Specialty: Orthopedic Surgery Why: For wound re-check Contact information: [address] [place]  [place] [address] [phone]         Antonetta Rollene BRAVO, MD Follow up.   Specialty: Family Medicine Contact information: [address] [place] [address] [phone]         Dialysis, [facility] [place]. Go on 11/06/2023.   Why: Please be at [facility] on 8/16 at 10:30am for dialysis. Contact information: [address] [place] [address] [phone]                Allergies  Allergen Reactions   Ace Inhibitors Anaphylaxis and Swelling   Penicillins Itching and Swelling    Has tolerated cefazolin  on multiple occasions    Statins Other (See Comments)    elevated LFT's     Albuterol  Swelling    You were cared for by a hospitalist during your hospital stay. If you have any questions about your discharge medications or the care you received while you were in the hospital after you are discharged, you can call the unit and asked to speak with the hospitalist on call if the  hospitalist that took care of you is not available. Once you are discharged, your primary care physician will handle any further medical issues. Please note that no refills for any discharge medications will be authorized once you are discharged, as it is imperative that you return  to your primary care physician (or establish a relationship with a primary care physician if you do not have one) for your aftercare needs so that they can reassess your need for medications and monitor your lab values.  You were cared for by a hospitalist during your hospital stay. If you have any questions about your discharge medications or the care you received while you were in the hospital after you are discharged, you can call the unit and asked to speak with the hospitalist on call if the hospitalist that took care of you is not available. Once you are discharged, your primary care physician will handle any further medical issues. Please note that NO REFILLS for any discharge medications will be authorized once you are discharged, as it is imperative that you return to your primary care physician (or establish a relationship with a primary care physician if you do not have one) for your aftercare needs so that they can reassess your need for medications and monitor your lab values.  Please request your Prim.MD to go over all Hospital Tests and Procedure/Radiological results at the follow up, please get all Hospital records sent to your Prim MD by signing hospital release before you go home.  Get CBC, CMP, 2 view Chest X ray checked  by Primary MD during your next visit or SNF MD in 5-7 days ( we routinely change or add medications that can affect your baseline labs and fluid status, therefore we recommend that you get the mentioned basic workup next visit with your PCP, your PCP may decide not to get them or add new tests based on their clinical decision)  On your next visit with your primary care physician please Get  Medicines reviewed and adjusted.  If you experience worsening of your admission symptoms, develop shortness of breath, life threatening emergency, suicidal or homicidal thoughts you must seek medical attention immediately by calling 911 or calling your MD immediately  if symptoms less severe.  You Must read complete instructions/literature along with all the possible adverse reactions/side effects for all the Medicines you take and that have been prescribed to you. Take any new Medicines after you have completely understood and accpet all the possible adverse reactions/side effects.   Do not drive, operate heavy machinery, perform activities at heights, swimming or participation in water  activities or provide baby sitting services if your were admitted for syncope or siezures until you have seen by Primary MD or a Neurologist and advised to do so again.  Do not drive when taking Pain medications.   Procedures/Studies: CT Hand Left Wo Contrast Addendum Date: 10/29/2023 ADDENDUM REPORT: 10/29/2023 12:32 ADDENDUM: These results were called by telephone at the time of interpretation on 10/29/2023 at 12:08 pm to provider JOSEPH ZAMMIT , who verbally acknowledged these results. Electronically Signed   By: Harrietta Sherry M.D.   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 Blisters on the posterior left hand. Pain. EXAM: CT OF THE LEFT HAND WITHOUT CONTRAST TECHNIQUE: Multidetector CT imaging of the left hand was performed according to the standard protocol. Multiplanar CT image reconstructions were also generated. RADIATION DOSE REDUCTION: This exam was performed according to the departmental dose-optimization program which includes automated exposure control, adjustment of the mA and/or kV according to patient size and/or use of iterative reconstruction technique. COMPARISON:  Left hand radiograph dated 11/10/2023. FINDINGS:  Bones/Joint/Cartilage No acute fracture or dislocation. Status post amputation of the third  proximal, middle, and distal phalanges. No evidence of acute osteolysis or erosive changes. Mild degenerative arthropathy throughout the hand. Ligaments Ligaments are suboptimally evaluated by CT. Soft tissue / Muscles and Tendons Blistering along the dorsal hand. Marked subcutaneous edema along the dorsal hand extending to the underlying extensor digitorum tendons with numerous scattered foci of soft tissue gas. Surrounding fat stranding. No discrete loculated fluid collection. Peripheral vascular calcifications. IMPRESSION: 1. Blistering and marked subcutaneous edema with stranding along the dorsal hand extending to the underlying extensor digitorum tendons with numerous scattered foci of soft tissue gas. These findings are concerning for gas-forming soft tissue infection. No discrete loculated fluid collection. 2. No acute osseous abnormality. 3. Status post amputation of the third digit. Electronically Signed: By: Harrietta Sherry M.D. On: 10/29/2023 12:02   DG Hand Complete Left Result Date: 10/29/2023 CLINICAL DATA:  Pain. EXAM: LEFT HAND - COMPLETE 3+ VIEW COMPARISON:  None Available. FINDINGS: There is amputation of the middle finger at the level of metacarpophalangeal joint. No acute fracture or dislocation. No aggressive osseous lesion. 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 No periarticular osteopenia or bony erosions. No radiopaque foreign bodies. There is diffuse soft tissue swelling over the dorsum of the hand. There are few foci of air within the soft tissue. However, underlying bones appear intact. No focal bone erosions. IMPRESSION: 1. No acute osseous abnormality of the left hand. 2. Amputation of middle finger at the level of metacarpophalangeal joint. 3. There is diffuse soft tissue swelling over the  dorsum of the hand. There are few foci of air within the soft tissue. However, underlying bones appear intact. No focal bone erosions. Electronically Signed   By: Ree Molt M.D.   On: 10/29/2023 10:09   US  LT UPPER EXTREM LTD SOFT TISSUE NON VASCULAR Result Date: 10/28/2023 CLINICAL DATA:  Swelling. Amputation of the left long finger on 09/29/2023. EXAM: ULTRASOUND left UPPER EXTREMITY LIMITED TECHNIQUE: Ultrasound examination of the upper extremity soft tissues was performed in the area of clinical concern. COMPARISON:  None Available. FINDINGS: Targeted sonographic evaluation of the dorsal left hand corresponding to the area of clinical concern demonstrates diffuse subcutaneous edema and ill-defined fluid without discrete loculated fluid collection. Irregular small echogenic foci with areas of dirty shadowing in the subcutaneous soft tissues could reflect soft tissue air. These findings may be secondary to postsurgical change, however, soft tissue infection with cellulitis and possible soft tissue gas cannot be excluded. IMPRESSION: Targeted sonographic evaluation of the dorsal left hand corresponding to the area of clinical concern demonstrates diffuse subcutaneous edema and ill-defined fluid without discrete loculated fluid collection. Irregular small echogenic foci with areas of dirty shadowing in the subcutaneous soft tissues could reflect soft tissue air. These findings may be secondary to postsurgical change, however, soft tissue infection with cellulitis and possible soft tissue gas cannot be excluded. Consider further evaluation with cross-sectional imaging such as contrast-enhanced CT or MRI. Electronically Signed   By: Harrietta Sherry M.D.   On: 10/28/2023 15:04   DG Chest Portable 1 View Result Date: 10/27/2023 CLINICAL DATA:  Shortness of breath. EXAM: PORTABLE CHEST 1 VIEW COMPARISON:  April 30, 2023. FINDINGS: Stable cardiomegaly. Right lung is clear. Minimal left basilar subsegmental  atelectasis is noted. Bony thorax is unremarkable. IMPRESSION: Minimal left basilar subsegmental atelectasis. Electronically Signed   By: Lynwood Landy Raddle M.D.   On: 10/27/2023 12:08     The results of significant diagnostics from  this hospitalization (including imaging, microbiology, ancillary and laboratory) are listed below for reference.     Microbiology: Recent Results (from the past 240 hours)  MRSA Next Gen by PCR, Nasal     Status: None   Collection Time: 10/27/23  1:43 PM   Specimen: Nasal Mucosa; Nasal Swab  Result Value Ref Range Status   MRSA by PCR Next Gen NOT DETECTED NOT DETECTED Final    Comment: (NOTE) The GeneXpert MRSA Assay (FDA approved for NASAL specimens only), is one component of a comprehensive MRSA colonization surveillance program. It is not intended to diagnose MRSA infection nor to guide or monitor treatment for MRSA infections. Test performance is not FDA approved in patients less than 45 years old. Performed at [facility], [address] [place], [place] [address]   Culture, blood (Routine X 2) w Reflex to ID Panel     Status: None   Collection Time: 10/27/23  6:59 PM   Specimen: BLOOD  Result Value Ref Range Status   Specimen Description BLOOD BLOOD RIGHT HAND  Final   Special Requests   Final    AEROBIC BOTTLE ONLY Blood Culture results may not be optimal due to an inadequate volume of blood received in culture bottles   Culture   Final    NO GROWTH 5 DAYS Performed at [facility], [address] [place], [place] [address]    Report Status 11/01/2023 FINAL  Final  Culture, blood (Routine X 2) w Reflex to ID Panel     Status: None   Collection Time: 10/27/23  6:59 PM   Specimen: BLOOD  Result Value Ref Range Status   Specimen Description BLOOD BLOOD RIGHT HAND  Final   Special Requests   Final    AEROBIC BOTTLE ONLY Blood Culture results may not be optimal due to an inadequate volume of blood received in culture bottles   Culture   Final     NO GROWTH 5 DAYS Performed at [facility], [address] [place], [place] [address]    Report Status 11/01/2023 FINAL  Final  Blood culture (routine x 2)     Status: None   Collection Time: 10/29/23 10:29 AM   Specimen: BLOOD  Result Value Ref Range Status   Specimen Description BLOOD BLOOD RIGHT ARM RAC  Final   Special Requests   Final    BOTTLES DRAWN AEROBIC AND ANAEROBIC Blood Culture adequate volume   Culture   Final    NO GROWTH 5 DAYS Performed at [facility], [address] [place], [place] [address]    Report Status 11/03/2023 FINAL  Final  Blood culture (routine x 2)     Status: None   Collection Time: 10/29/23 10:29 AM   Specimen: BLOOD  Result Value Ref Range Status   Specimen Description BLOOD BLOOD RIGHT ARM LOWER ARM  Final   Special Requests   Final    BOTTLES DRAWN AEROBIC AND ANAEROBIC Blood Culture adequate volume   Culture   Final    NO GROWTH 5 DAYS Performed at [facility], [address] [place], [place] [address]    Report Status 11/03/2023 FINAL  Final  Surgical PCR screen     Status: Abnormal   Collection Time: 10/30/23  5:53 AM   Specimen: Nasal Mucosa; Nasal Swab  Result Value Ref Range Status   MRSA, PCR NEGATIVE NEGATIVE Final   Staphylococcus aureus POSITIVE (A) NEGATIVE Final    Comment: (NOTE) The Xpert SA Assay (FDA approved for  NASAL specimens in patients 66 years of age and older), is one component of a comprehensive surveillance program. It is not intended to diagnose infection nor to guide or monitor treatment. Performed at [facility], [address] [place], [place] [address]   Aerobic/Anaerobic Culture w Gram Stain (surgical/deep wound)     Status: None   Collection Time: 10/30/23  8:09 AM   Specimen: Wound; Body Fluid  Result Value Ref Range Status   Specimen Description WOUND  Final   Special Requests LEFT HAND FLUID COLLECTION  Final   Gram Stain   Final    RARE WBC PRESENT, PREDOMINANTLY PMN ABUNDANT GRAM  POSITIVE COCCI    Culture   Final    MODERATE STAPHYLOCOCCUS AUREUS NO ANAEROBES ISOLATED Performed at [facility], [address], [place], [place] [address]    Report Status 11/04/2023 FINAL  Final   Organism ID, Bacteria STAPHYLOCOCCUS AUREUS  Final      Susceptibility   Staphylococcus aureus - MIC*    CIPROFLOXACIN  >=8 RESISTANT Resistant     ERYTHROMYCIN >=8 RESISTANT Resistant     GENTAMICIN  >=16 RESISTANT Resistant     OXACILLIN 0.5 SENSITIVE Sensitive     TETRACYCLINE >=16 RESISTANT Resistant     VANCOMYCIN  <=0.5 SENSITIVE Sensitive     TRIMETH /SULFA  >=320 RESISTANT Resistant     CLINDAMYCIN  >=8 RESISTANT Resistant     RIFAMPIN <=0.5 SENSITIVE Sensitive     Inducible Clindamycin  NEGATIVE Sensitive     LINEZOLID 2 SENSITIVE Sensitive     * MODERATE STAPHYLOCOCCUS AUREUS     Labs: BNP (last 3 results) Recent Labs    12/28/22 0842  BNP 2,982.0*   Basic Metabolic Panel: Recent Labs  Lab 10/31/23 0439 11/01/23 0537 11/02/23 0529 11/03/23 0357 11/04/23 0436 11/05/23 0508  NA 142 141 138 140 138 140  K 5.4* 5.1 4.1 4.3 4.4 4.5  CL 104 103 97* 98 98 99  CO2 22 22 24 26 26 28   GLUCOSE 120* 122* 146* 149* 148* 115*  BUN 58* 75* 45* 58* 38* 56*  CREATININE 5.84* 6.75* 4.42* 5.52* 3.89* 5.18*  CALCIUM  9.3 9.1 8.5* 8.9 9.0 8.7*  MG  --  2.1 1.9 2.1 1.9 2.0  PHOS 6.5* 6.4* 5.2* 6.1* 4.2  --    Liver Function Tests: Recent Labs  Lab 10/30/23 0432 10/31/23 0439 11/01/23 0537 11/02/23 0529 11/03/23 0357  AST  --   --  18 18 16   ALT  --   --  25 25 20   ALKPHOS  --   --  111 118 114  BILITOT  --   --  0.9 0.9 0.4  PROT  --   --  5.9* 6.0* 5.8*  ALBUMIN  2.2* 2.1* 1.9* 1.9* 1.9*   No results for input(s): LIPASE, AMYLASE in the last 168 hours. Recent Labs  Lab 11/01/23 1314  AMMONIA 27   CBC: Recent Labs  Lab 10/31/23 0439 11/01/23 0537 11/02/23 0529 11/03/23 0357 11/04/23 0436 11/05/23 0508  WBC 22.5* 20.1* 18.3* 16.6* 17.5* 16.7*   NEUTROABS 19.5* 16.9*  --  13.6*  --   --   HGB 9.6* 9.7* 10.1* 10.4* 11.0* 10.2*  HCT 30.0* 30.1* 31.3* 32.8* 34.4* 31.8*  MCV 75.8* 75.3* 75.1* 74.7* 75.1* 74.8*  PLT 417* 430* 477* 451* 433* 401*   Cardiac Enzymes: No results for input(s): CKTOTAL, CKMB, CKMBINDEX, TROPONINI in the last 168 hours. BNP: Invalid input(s): POCBNP CBG: Recent Labs  Lab 11/04/23 1607 11/04/23  2104 11/05/23 0459 11/05/23 0823 11/05/23 1234  GLUCAP 204* 161* 130* 225* 323*   D-Dimer No results for input(s): DDIMER in the last 72 hours. Hgb A1c No results for input(s): HGBA1C in the last 72 hours. Lipid Profile No results for input(s): CHOL, HDL, LDLCALC, TRIG, CHOLHDL, LDLDIRECT in the last 72 hours. Thyroid  function studies No results for input(s): TSH, T4TOTAL, T3FREE, THYROIDAB in the last 72 hours.  Invalid input(s): FREET3 Anemia work up No results for input(s): VITAMINB12, FOLATE, FERRITIN, TIBC, IRON, RETICCTPCT in the last 72 hours. Urinalysis    Component Value Date/Time   COLORURINE YELLOW 11/07/2017 2340   APPEARANCEUR HAZY (A) 11/07/2017 2340   LABSPEC 1.017 11/07/2017 2340   PHURINE 5.0 11/07/2017 2340   GLUCOSEU 150 (A) 11/07/2017 2340   HGBUR NEGATIVE 11/07/2017 2340   BILIRUBINUR NEGATIVE 11/07/2017 2340   BILIRUBINUR neg 02/15/2017 0913   KETONESUR NEGATIVE 11/07/2017 2340   PROTEINUR >=300 (A) 11/07/2017 2340   UROBILINOGEN 0.2 02/15/2017 0913   NITRITE NEGATIVE 11/07/2017 2340   LEUKOCYTESUR NEGATIVE 11/07/2017 2340   Sepsis Labs Recent Labs  Lab 11/02/23 0529 11/03/23 0357 11/04/23 0436 11/05/23 0508  WBC 18.3* 16.6* 17.5* 16.7*   Microbiology Recent Results (from the past 240 hours)  MRSA Next Gen by PCR, Nasal     Status: None   Collection Time: 10/27/23  1:43 PM   Specimen: Nasal Mucosa; Nasal Swab  Result Value Ref Range Status   MRSA by PCR Next Gen NOT DETECTED NOT DETECTED Final    Comment:  (NOTE) The GeneXpert MRSA Assay (FDA approved for NASAL specimens only), is one component of a comprehensive MRSA colonization surveillance program. It is not intended to diagnose MRSA infection nor to guide or monitor treatment for MRSA infections. Test performance is not FDA approved in patients less than 7 years old. Performed at [facility], [address], [place], [place] [address]   Culture, blood (Routine X 2) w Reflex to ID Panel     Status: None   Collection Time: 10/27/23  6:59 PM   Specimen: BLOOD  Result Value Ref Range Status   Specimen Description BLOOD BLOOD RIGHT HAND  Final   Special Requests   Final    AEROBIC BOTTLE ONLY Blood Culture results may not be optimal due to an inadequate volume of blood received in culture bottles   Culture   Final    NO GROWTH 5 DAYS Performed at [facility], [address] [place], [place] [address]    Report Status 11/01/2023 FINAL  Final  Culture, blood (Routine X 2) w Reflex to ID Panel     Status: None   Collection Time: 10/27/23  6:59 PM   Specimen: BLOOD  Result Value Ref Range Status   Specimen Description BLOOD BLOOD RIGHT HAND  Final   Special Requests   Final    AEROBIC BOTTLE ONLY Blood Culture results may not be optimal due to an inadequate volume of blood received in culture bottles   Culture   Final    NO GROWTH 5 DAYS Performed at [facility], [address], [place], [place] [address]    Report Status 11/01/2023 FINAL  Final  Blood culture (routine x 2)     Status: None   Collection Time: 10/29/23 10:29 AM   Specimen: BLOOD  Result Value Ref Range Status   Specimen Description BLOOD BLOOD RIGHT ARM RAC  Final   Special Requests   Final    BOTTLES DRAWN AEROBIC AND  ANAEROBIC Blood Culture adequate volume   Culture   Final    NO GROWTH 5 DAYS Performed at [facility], [address], [place], [place] [address]    Report Status 11/03/2023 FINAL  Final  Blood culture (routine x 2)     Status: None    Collection Time: 10/29/23 10:29 AM   Specimen: BLOOD  Result Value Ref Range Status   Specimen Description BLOOD BLOOD RIGHT ARM LOWER ARM  Final   Special Requests   Final    BOTTLES DRAWN AEROBIC AND ANAEROBIC Blood Culture adequate volume   Culture   Final    NO GROWTH 5 DAYS Performed at [facility], [address] [place], [place] [address]    Report Status 11/03/2023 FINAL  Final  Surgical PCR screen     Status: Abnormal   Collection Time: 10/30/23  5:53 AM   Specimen: Nasal Mucosa; Nasal Swab  Result Value Ref Range Status   MRSA, PCR NEGATIVE NEGATIVE Final   Staphylococcus aureus POSITIVE (A) NEGATIVE Final    Comment: (NOTE) The Xpert SA Assay (FDA approved for NASAL specimens in patients 30 years of age and older), is one component of a comprehensive surveillance program. It is not intended to diagnose infection nor to guide or monitor treatment. Performed at [facility], [address] [place], [place] [address]   Aerobic/Anaerobic Culture w Gram Stain (surgical/deep wound)     Status: None   Collection Time: 10/30/23  8:09 AM   Specimen: Wound; Body Fluid  Result Value Ref Range Status   Specimen Description WOUND  Final   Special Requests LEFT HAND FLUID COLLECTION  Final   Gram Stain   Final    RARE WBC PRESENT, PREDOMINANTLY PMN ABUNDANT GRAM POSITIVE COCCI    Culture   Final    MODERATE STAPHYLOCOCCUS AUREUS NO ANAEROBES ISOLATED Performed at [facility], [address] [place], [place] [address]    Report Status 11/04/2023 FINAL  Final   Organism ID, Bacteria STAPHYLOCOCCUS AUREUS  Final      Susceptibility   Staphylococcus aureus - MIC*    CIPROFLOXACIN  >=8 RESISTANT Resistant     ERYTHROMYCIN >=8 RESISTANT Resistant     GENTAMICIN  >=16 RESISTANT Resistant     OXACILLIN 0.5 SENSITIVE Sensitive     TETRACYCLINE >=16 RESISTANT Resistant     VANCOMYCIN  <=0.5 SENSITIVE Sensitive     TRIMETH /SULFA  >=320 RESISTANT Resistant      CLINDAMYCIN  >=8 RESISTANT Resistant     RIFAMPIN <=0.5 SENSITIVE Sensitive     Inducible Clindamycin  NEGATIVE Sensitive     LINEZOLID 2 SENSITIVE Sensitive     * MODERATE STAPHYLOCOCCUS AUREUS     Time coordinating discharge:  I have spent 35 minutes face to face with the patient and on the ward discussing the patients care, assessment, plan and disposition with other care givers. >50% of the time was devoted counseling the patient about the risks and benefits of treatment/Discharge disposition and coordinating care.   SIGNED:   Burgess JAYSON Dare, MD  [facility] 11/05/2023, 12:38 PM   If 7PM-7AM, please contact night-coverage

## 2023-11-05 NOTE — TOC Transition Note (Signed)
 Transition of Care [facility]) - Discharge Note   Patient Details  Name: Kathryn Beck MRN: [id number] Date of Birth: [birth year]  [facility] CM/SW Contact:  Sudie Erminio Deems, RN Phone Number: 11/05/2023, 1:47 PM   Clinical Narrative:  Patient was initially  scheduled for HD today at 1400. Dialysis Navigator CSW arranged outpatient HD for Sat session- daughter is agreeable and will have to pay out of pocket for transportation. Case Manager has called [facility] for transport home. [facility] will arrive in the next two hours. Daughter is aware of arrival time. No further needs identified at this time.  Final next level of care: Home w Home Health Services Barriers to Discharge: No Barriers Identified   Patient Goals and CMS Choice Patient states their goals for this hospitalization and ongoing recovery are:: patient to return home once stable.   Choice offered to / list presented to :  (Family has used [facility] in the past and wants to use them again.)    Discharge Plan and Services Additional resources added to the After Visit Summary for   In-house Referral: NA Discharge Planning Services: CM Consult Post Acute Care Choice: Home Health          DME Arranged: Hospital bed DME Agency: [employer] Date DME Agency Contacted: 11/03/23 Time DME Agency Contacted: 1318 Representative spoke with at DME Agency: [place] HH Arranged: Disease Management, RN, OT, Nurse's Aide HH Agency: [facility] Date [facility] Agency Contacted: 11/02/23 Time HH Agency Contacted: 1049 Representative spoke with at [facility] Agency: [place]  Social Drivers of Health (SDOH) Interventions SDOH Screenings   Food Insecurity: No Food Insecurity (10/29/2023)  Housing: Low Risk  (10/29/2023)  Transportation Needs: No Transportation Needs (10/29/2023)  Utilities: Not At Risk (10/29/2023)  Alcohol Screen: Low Risk  (05/03/2023)  Depression (PHQ2-9): High Risk (08/26/2023)  Financial Resource Strain: Low  Risk  (05/03/2023)  Physical Activity: Inactive (05/03/2023)  Social Connections: Moderately Isolated (10/29/2023)  Stress: No Stress Concern Present (05/03/2023)  Tobacco Use: Low Risk  (10/30/2023)  Health Literacy: Adequate Health Literacy (05/03/2023)  Recent Concern: Health Literacy - Inadequate Health Literacy (02/08/2023)   Readmission Risk Interventions    12/18/2022    3:14 PM 05/01/2022    3:54 PM  Readmission Risk Prevention Plan  Transportation Screening Complete Complete  HRI or Home Care Consult  Complete  Social Work Consult for Recovery Care Planning/Counseling  --  Palliative Care Screening  Not Applicable  Medication Review Oceanographer) Complete Complete  PCP or Specialist appointment within 3-5 days of discharge Not Complete   HRI or Home Care Consult Complete   SW Recovery Care/Counseling Consult Complete   Palliative Care Screening Not Applicable   Skilled Nursing Facility Not Applicable

## 2023-11-05 NOTE — Progress Notes (Addendum)
 Contacted nephrology NP regarding the possibility of avoiding day of discharge dialysis. Got approval that pt is stable enough for op HD. Contacted op HD clinic, [facility], to inquire about any openings tomorrow. Spoke with pt at bedside, pt was agreeable to an appointment tomorrow at 10:45. Confirmed this opening with clinic, they agreed.   Pt now has an op HD appt at [facility],8/16, 10:45 chair time, pt has been notified and instructed on when to arrive (1030). Pt stated she can find transportation. Pt care team have been informed. Will update AVS at this time.   Stepahnie Campo Dialysis Navigator [phone]  Addendum 2:55 Contacted by pharmacist regarding pt meds with HD. Contacted op HD clinic to inform that pt will be receiving Cefazolin  2g/HD thru 9/3. Clinic aware at this time.

## 2023-11-05 NOTE — Progress Notes (Signed)
 Occupational Therapy Treatment Patient Details Name: Kathryn Beck MRN: [id number] DOB: Dec 29, 1955 Today's Date: 11/05/2023   History of present illness Pt is a 68 y.o. female presenting with new AFIB, acute metabolic encephalopathy, and L hand abscess s/p I&D with drain placement 8/9. 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 PMH includes: bilateral BKA, anemia, arthritis, CHF, ESRD on HD MWF, HTN, memory loss, obesity, OSA, stroke, HTN, and DM II.   OT comments  Pt. Seen for skilled OT treatment session. Plans for splint check but splint had been removed prior to my arrival.  Pt reports in preparation for pending HD.  Pt. Reports straps felt fine with no pressure feeling or skin complaints.  Reports splint was not rubbing anywhere and fit well.  C/o pain at L digits.  Partial HEP as pt. Could not tolerate wrist or digits ROM secondary to c/o pain.  Able to recall some of the exercises introduced yesterday.  Pts. Dtr. Has copies of the HEP for continuation at home. Cont. With acute OT POC.         If plan is discharge home, recommend the following:  Two people to help with walking and/or transfers;Two people to help with bathing/dressing/bathroom;Assistance with cooking/housework;Assistance with feeding;Direct supervision/assist for medications management;Direct supervision/assist for financial management;Assist for transportation;Help with stairs or ramp for entrance;Supervision due to cognitive status   Equipment Recommendations  Hospital bed    Recommendations for Other Services      Precautions / Restrictions Precautions Precautions: Fall Recall of Precautions/Restrictions: Impaired Required Braces or Orthoses: Splint/Cast Splint/Cast: L resting hand splint Other Brace: LUE ace wrap dressing s/p I&D Restrictions LUE Weight Bearing Per Provider Order: Non weight bearing       Mobility Bed Mobility                    Transfers                          Balance                                           ADL either performed or assessed with clinical judgement   ADL                                              Extremity/Trunk Assessment              Vision       Perception     Praxis     Communication Communication Communication: No apparent difficulties   Cognition Arousal: Lethargic Behavior During Therapy: WFL for tasks assessed/performed Cognition: History of cognitive impairments     Awareness: Intellectual awareness impaired, Online awareness impaired Memory impairment (select all impairments): Short-term memory, Working [employer] fast streamer, Conservation officer, historic buildings Attention impairment (select first level of impairment): Sustained attention Executive functioning impairment (select all impairments): Problem solving OT - Cognition Comments: pt. reports im just tired, some delay between questions and replies but following commands and participating                 Following commands: Impaired Following commands impaired: Only follows one step commands consistently, Follows one step commands with increased time  Cueing   Cueing Techniques: Verbal cues, Visual cues  Exercises General Exercises - Upper Extremity Elbow Flexion: Left, 5 reps, AROM Elbow Extension: Left, 10 reps, AROM Wrist Flexion: Left, limited could not tolerate full ROM due to pain Wrist Extension: Left, limited could not tolerate full ROM due to pain Digit Composite Flexion:  unable to tolerate P/AROM due to pain  Composite Extension: unable to tolerate P/AROM due to pain Other Exercises Other Exercises: splint off upon arrival. states it was removed in preparation for HD.  pt. reports splint feels great. states no issues with straps or feeling the splint is putting pressure or rubbing anywhere.  reviewed to put splint back on after the 2hrs off and/or HD.  pt. verbalized understanding Other  Exercises: HEP handouts had been removed from the room.  pt. initiating forearm pronation/supination, and was doing shoulder shrugs also    Shoulder Instructions       General Comments      Pertinent Vitals/ Pain       Pain Assessment Pain Assessment: faces, hurts a little more, saying it just hurts but did not rate, limited activity within pt. Tolerance   Home Living                                          Prior Functioning/Environment              Frequency           Progress Toward Goals  OT Goals(current goals can now be found in the care plan section)  Progress towards OT goals: Progressing toward goals     Plan      Co-evaluation                 AM-PAC OT 6 Clicks Daily Activity     Outcome Measure   Help from another person eating meals?: A Little Help from another person taking care of personal grooming?: A Little Help from another person toileting, which includes using toliet, bedpan, or urinal?: Total Help from another person bathing (including washing, rinsing, drying)?: Total Help from another person to put on and taking off regular upper body clothing?: Total Help from another person to put on and taking off regular lower body clothing?: Total 6 Click Score: 10    End of Session    OT Visit Diagnosis: Pain;Muscle weakness (generalized) (M62.81);Other abnormalities of gait and mobility (R26.89) Pain - Right/Left: Left Pain - part of body: Hand;Arm   Activity Tolerance Patient tolerated treatment well   Patient Left in bed;with call bell/phone within reach   Nurse Communication          Time: 8646-8596 OT Time Calculation (min): 10 min  Charges: OT General Charges $OT Visit: 1 Visit OT Treatments $Therapeutic Exercise: 8-22 mins  .  CHRISTELLA Nest Lorraine-COTA/L  11/05/2023, 3:43 PM  [place], COTA/L Acute Rehabilitation [phone]

## 2023-11-05 NOTE — Progress Notes (Signed)
 [facility] Progress Note   Subjective:    Patient seen and examined in her room this morning. She reports some pain associated with her hand. She tells me that she is ready to go home. No family present this morning. She is not on amio gtt anymore and remains in sinus rhythm. No dyspnea or CP reported. Next HD 8/15  Objective Vitals:   11/04/23 2100 11/04/23 2304 11/05/23 0501 11/05/23 0745  BP: (!) 162/68 134/60 (!) 147/63 (!) 146/61  Pulse:  68 68 74  Resp: 18 18 18 19   Temp: 98.3 F (36.8 C) (!) 97.5 F (36.4 C) (!) 97.5 F (36.4 C) 97.7 F (36.5 C)  TempSrc: Oral Oral Oral Oral  SpO2:  100% 100% 99%  Weight:      Height:       Physical Exam General: Awake; NAD Heart: S1 and S2; RRR; No murmurs Lungs: Clear throughout Abdomen: Soft and non-tender Extremities: B/l BKAs; No edema b/l stumps, LUH wrapped and wrist brace in on today Dialysis Access:  L AVF   Filed Weights   11/01/23 0746 11/01/23 1145 11/03/23 1214  Weight: 74.1 kg 74.5 kg 76.7 kg    Intake/Output Summary (Last 24 hours) at 11/05/2023 1001 Last data filed at 11/05/2023 0400 Gross per 24 hour  Intake 0.28 ml  Output --  Net 0.28 ml     Additional Objective Labs: Basic Metabolic Panel: Recent Labs  Lab 11/02/23 0529 11/03/23 0357 11/04/23 0436 11/05/23 0508  NA 138 140 138 140  K 4.1 4.3 4.4 4.5  CL 97* 98 98 99  CO2 24 26 26 28   GLUCOSE 146* 149* 148* 115*  BUN 45* 58* 38* 56*  CREATININE 4.42* 5.52* 3.89* 5.18*  CALCIUM  8.5* 8.9 9.0 8.7*  PHOS 5.2* 6.1* 4.2  --    Liver Function Tests: Recent Labs  Lab 11/01/23 0537 11/02/23 0529 11/03/23 0357  AST 18 18 16   ALT 25 25 20   ALKPHOS 111 118 114  BILITOT 0.9 0.9 0.4  PROT 5.9* 6.0* 5.8*  ALBUMIN  1.9* 1.9* 1.9*   No results for input(s): LIPASE, AMYLASE in the last 168 hours. CBC: Recent Labs  Lab 10/31/23 0439 11/01/23 0537 11/02/23 0529 11/03/23 0357 11/04/23 0436 11/05/23 0508  WBC 22.5* 20.1*  18.3* 16.6* 17.5* 16.7*  NEUTROABS 19.5* 16.9*  --  13.6*  --   --   HGB 9.6* 9.7* 10.1* 10.4* 11.0* 10.2*  HCT 30.0* 30.1* 31.3* 32.8* 34.4* 31.8*  MCV 75.8* 75.3* 75.1* 74.7* 75.1* 74.8*  PLT 417* 430* 477* 451* 433* 401*    Medications:   ceFAZolin  (ANCEF ) IV Stopped (11/03/23 1854)    (feeding supplement) PROSource Plus  30 mL Oral BID BM   acetaminophen   1,000 mg Oral Q8H   amLODipine   5 mg Oral Daily   apixaban   5 mg Oral BID   aspirin   81 mg Oral Daily   busPIRone   5 mg Oral BID   calcitRIOL   1 mcg Oral Q M,W,F-HD   cinacalcet   90 mg Oral Q M,W,F-HD   darbepoetin (ARANESP ) injection - DIALYSIS  40 mcg Subcutaneous Q Fri-1800   DULoxetine   60 mg Oral Daily   ezetimibe   10 mg Oral Daily   insulin  aspart  0-5 Units Subcutaneous QHS   insulin  aspart  0-9 Units Subcutaneous TID WC   metoprolol  succinate  50 mg Oral Q T,Th,S,Su   multivitamin  1 tablet Oral Daily   polyethylene glycol  17 g Oral BID  sertraline   50 mg Oral Daily   sucroferric oxyhydroxide  1,000 mg Oral TID WC   torsemide   100 mg Oral Q T,Th,S,Su    Dialysis Orders: [facility] - MWF 3.5hrs BFR 400 DFR 500  2K/2.5Ca  EDW 73.5kg Heparin  bolus 1000 units Calcitriol  with HD - last dose 10/29/23 Sensipar  90mg  with HD - last dose 10/29/23 Micera 50mcg Q2wks - last dose 10/18/23 [name]  1000mg  TID with meals  Assessment/Plan: Left hand pain with swelling - L hand CT showed concern for soft tissue infection. S/p L hand I&D with drain placement, per Vanc, Cefepime , and Flagyl . Blood culture resulted with G+ with moderate staph aureus. On IV antibiotics for now. WBC 16.7 today. She reports that it is painful today.  ESRD -  on HD MWF. HD yesterday with 2L UF. Plan for next HD 8/15 per her usual schedule. K+ now 4.4. Above her EDW post HD. Will plan for more UF tomorrow. Will monitor.  Hypertension/volume  - Bps better today. Started Amlodipine  5 mg yesterday. I suspect that it will improve with increased  UF. Continue home medications. Anemia of CKD - Hgb 11. Next ESA due 8/15 Secondary Hyperparathyroidism -  Added calcitriol  and continue Sensipar . On binders. Nutrition - Renal diet with fluid restriction. Continue with protein supplements.   Belvie Och, NP [facility] 11/05/2023,10:01 AM  LOS: 7 days

## 2023-11-06 DIAGNOSIS — L03114 Cellulitis of left upper limb: Secondary | ICD-10-CM | POA: Diagnosis not present

## 2023-11-06 DIAGNOSIS — N2581 Secondary hyperparathyroidism of renal origin: Secondary | ICD-10-CM | POA: Diagnosis not present

## 2023-11-06 DIAGNOSIS — N186 End stage renal disease: Secondary | ICD-10-CM | POA: Diagnosis not present

## 2023-11-06 DIAGNOSIS — Z992 Dependence on renal dialysis: Secondary | ICD-10-CM | POA: Diagnosis not present

## 2023-11-08 ENCOUNTER — Telehealth: Payer: Self-pay

## 2023-11-08 ENCOUNTER — Encounter: Payer: Self-pay | Admitting: Orthopedic Surgery

## 2023-11-08 ENCOUNTER — Encounter: Payer: Self-pay | Admitting: Family Medicine

## 2023-11-08 ENCOUNTER — Telehealth: Payer: Self-pay | Admitting: *Deleted

## 2023-11-08 DIAGNOSIS — W19XXXA Unspecified fall, initial encounter: Secondary | ICD-10-CM | POA: Diagnosis not present

## 2023-11-08 DIAGNOSIS — N186 End stage renal disease: Secondary | ICD-10-CM | POA: Diagnosis not present

## 2023-11-08 DIAGNOSIS — Z992 Dependence on renal dialysis: Secondary | ICD-10-CM | POA: Diagnosis not present

## 2023-11-08 DIAGNOSIS — N2581 Secondary hyperparathyroidism of renal origin: Secondary | ICD-10-CM | POA: Diagnosis not present

## 2023-11-08 DIAGNOSIS — I4891 Unspecified atrial fibrillation: Secondary | ICD-10-CM | POA: Diagnosis not present

## 2023-11-08 DIAGNOSIS — L03114 Cellulitis of left upper limb: Secondary | ICD-10-CM | POA: Diagnosis not present

## 2023-11-08 NOTE — Telephone Encounter (Signed)
 Copied from CRM #[id number]. Topic: Clinical - Home Health Verbal Orders >> Nov 08, 2023  4:06 PM Roselie BROCKS wrote: Caller/Agency: [facility] heat Callback Number: [phone] Service Requested: Physical Therapy Frequency: Delay in start of care, daughter requests care start on 11-09-2023 Any new concerns about the patient? No

## 2023-11-08 NOTE — Telephone Encounter (Signed)
 Copied from CRM #[id number]. Topic: Appointments - Appointment Scheduling >> Nov 08, 2023  1:56 PM Nathanel BROCKS wrote: Patient/patient representative is calling to schedule an appointment. Refer to attachments for appointment information.   Pt has called for a hosp follow up but nothing was available until Sept 18. Is there anyway that you can work her in, if so please call her daughter, Teresita Husband at [phone].

## 2023-11-08 NOTE — Telephone Encounter (Signed)
 Copied from CRM #[id number]. Topic: Clinical - Medication Question >> Nov 08, 2023  2:00 PM Nathanel BROCKS wrote: Reason for CRM:  Meloxicam  Hydroxyzine    Does the pt still need to take this medication it is not on her current rx listting. Please call, [phone] Ms. Perkins.

## 2023-11-08 NOTE — Transitions of Care (Post Inpatient/ED Visit) (Signed)
 11/08/2023  Name: Kathryn Beck MRN: [id number] DOB: [birth year]  Today's TOC FU Call Status: Today's TOC FU Call Status:: Successful TOC FU Call Completed TOC FU Call Complete Date: 11/08/23 Patient's Name and Date of Birth confirmed.  Transition Care Management Follow-up Telephone Call Date of Discharge: 11/05/23 Discharge Facility: [facility] [place]) Type of Discharge: Inpatient Admission Primary Inpatient Discharge Diagnosis:: Abscess of left hand How have you been since you were released from the hospital?: Better Any questions or concerns?: Yes Patient Questions/Concerns:: Patient was taking Meloxicam  and hydralizine before admission and it was not on the list. should she take it. Patient Questions/Concerns Addressed: Other: (RN made daughter aware to call the PCP and let her know that the meloxicam  and the hydralizine was not on the discharge list.)  Items Reviewed: Did you receive and understand the discharge instructions provided?: Yes Medications obtained,verified, and reconciled?: Yes (Medications Reviewed) Any new allergies since your discharge?: No Do you have support at home?: Yes People in Home [RPT]: spouse Name of Support/Comfort Primary Source: [name] daughter  Medications Reviewed Today: Medications Reviewed Today     Reviewed by Kennieth Cathlean DEL, RN (Case Manager) on 11/08/23 at 1107  Med List Status: <None>   Medication Order Taking? Sig Documenting Provider Last Dose Status Informant  0.9 %  sodium chloride  infusion [id number]   Lockamy, Randi L, NP-C  Active   acetaminophen  (TYLENOL ) 650 MG CR tablet [id number] Yes Take 1,300 mg by mouth every 8 (eight) hours as needed for pain. [provider]  Active Child, Pharmacy Records  amLODipine  (NORVASC ) 10 MG tablet [id number] Yes Take 0.5 tablets (5 mg total) by mouth daily. Pearlean Manus, MD  Active Child, Pharmacy Records           Med Note JACKOLYN WADDELL DEL   Fri Oct 29, 2023  1:02 PM) Last dose  08/05. Was still taking 10mg  daily. Did not start new dose of 5mg  yet.  apixaban  (ELIQUIS ) 5 MG TABS tablet [id number] Yes Take 1 tablet (5 mg total) by mouth 2 (two) times daily. For Afib Pearlean Manus, MD  Active Child, Pharmacy Records  aspirin  81 MG chewable tablet [id number] Yes Chew 2 tablets (162 mg total) by mouth daily.  Patient taking differently: Chew 81 mg by mouth 2 (two) times daily.   Bevely Doffing, FNP  Active Child, Pharmacy Records  B Complex-C-Folic Acid  (RENA-VITE RX) 1 MG TABS [id number] Yes Take 1 tablet by mouth daily. [provider]  Active Child, Pharmacy Records           Med Note WORLEY STEPHANE GORMAN [place] Nov 19, 2022 11:34 AM)    busPIRone  (BUSPAR ) 5 MG tablet [id number] Yes TAKE ONE TABLET BY MOUTH TWICE DAILY Antonetta Rollene BRAVO, MD  Active Child, Pharmacy Records  calcitRIOL  (ROCALTROL ) 0.5 MCG capsule [id number] Yes Take 2 capsules (1 mcg total) by mouth every Monday, Wednesday, and Friday with hemodialysis. Caleen Burgess BROCKS, MD  Active   ceFAZolin  (ANCEF ) 2-4 GM/100ML-% IVPB [id number] Yes Inject 100 mLs (2 g total) into the vein every Monday, Wednesday, and Friday with hemodialysis for 20 days. Caleen Burgess BROCKS, MD  Active   cinacalcet  (SENSIPAR ) 30 MG tablet [id number] Yes Take 3 tablets (90 mg total) by mouth 3 (three) times a week.  Patient taking differently: Take 90 mg by mouth every Monday, Wednesday, and Friday.   Sherrill Alejandro Donovan, DO  Active Child, Pharmacy Records           Med  Note DRENA, ANGELICA G   Sat May 01, 2023 12:25 PM)    Darbepoetin Alfa  (ARANESP ) 40 MCG/0.4ML SOSY injection [id number] Yes Inject 0.4 mLs (40 mcg total) into the skin every Friday at 6 PM. Amin, Ankit C, MD  Active   DULoxetine  (CYMBALTA ) 60 MG capsule [id number] Yes TAKE (1) CAPSULE BY MOUTH ONCE DAILY. Antonetta Rollene BRAVO, MD  Active Child, Pharmacy Records  ezetimibe  (ZETIA ) 10 MG tablet [id number] Yes Take 1 tablet (10 mg total) by mouth daily. Antonetta Rollene BRAVO, MD  Active  Child, Pharmacy Records    Discontinued 05/28/11 1302 (Patient has not taken in last 30 days)     Discontinued 05/28/11 1302 (Change in therapy)   insulin  glargine, 2 Unit Dial , (TOUJEO  MAX SOLOSTAR) 300 UNIT/ML Solostar Pen [id number] Yes Inject 30 Units into the skin daily. May need to slowly increase the dose depending upon your blood sugar, follow-up with PCP Trixie File, MD  Active Child, Pharmacy Records           Med Note WORLEY, [place] S   Wed Oct 27, 2023  2:44 PM)    metoprolol  succinate (TOPROL -XL) 50 MG 24 hr tablet [id number] Yes TAKE (1) TABLET BY MOUTH DAILY WITH FOOD *TAKE AFTER DIALYSIS*  Patient taking differently: Take 50 mg by mouth Every Tuesday,Thursday,and Saturday with dialysis. Take 50mg  (1 tablets) by mouth on non-dialysis days. (Sunday, Tuesday, Thursday, Saturday).   Antonetta Rollene BRAVO, MD  Active Child, Pharmacy Records           Med Note JACKOLYN WADDELL DEL   Fri Oct 29, 2023  1:07 PM) Daughter confirmed only taking on non-dialysis days.   oxyCODONE -acetaminophen  (PERCOCET/ROXICET) 5-325 MG tablet [id number] Yes Take 1 tablet by mouth every 6 (six) hours as needed. Gerome Maurilio HERO, PA-C  Active Child, Pharmacy Records  torsemide  (DEMADEX ) 100 MG tablet [id number] Yes Take 1 tablet (100 mg total) by mouth every Tuesday, Thursday, Saturday, and Sunday. Pearlean Manus, MD  Active Child, Pharmacy Records           Med Note JACKOLYN WADDELL DEL   Fri Oct 29, 2023  1:05 PM) Looks like she was taking 100mg  BID prior to admission (holding morning dose on dialysis days). Did not start new dose of 100mg  once daily yet. Last dose at home 08/05.  [name]  500 MG chewable tablet [id number] Yes Chew 1-2 tablets (500-1,000 mg total) by mouth See admin instructions. Take 1000mg  (2 tablets) by mouth with meals and 500mg  (1 tablet) with snacks Antonetta Rollene BRAVO, MD  Active Child, Pharmacy Records  Med List Note (Ashe, Dana K, CPhT 07/18/22 1635): Patient has dialysis on MWF             Home Care and Equipment/Supplies: Were Home Health Services Ordered?: Yes Name of Home Health Agency:: [facility] Has Agency set up a time to come to your home?: No EMR reviewed for Home Health Orders: Orders present/patient has not received call (refer to CM for follow-up) Any new equipment or medical supplies ordered?: Yes Name of Medical supply agency?: [employer] Were you able to get the equipment/medical supplies?: No (patient daughter called and left message) Do you have any questions related to the use of the equipment/supplies?: No  Functional Questionnaire: Do you need assistance with meal preparation?: Yes Do you need assistance with eating?: No Do you need assistance with getting out of bed/getting out of a chair/moving?: No Do you have difficulty managing or taking your medications?: No  Follow up appointments reviewed:  PCP Follow-up appointment confirmed?: Yes Date of PCP follow-up appointment?: 12/07/23 Follow-up Provider: Rollene Pesa Specialist [facility] Follow-up appointment confirmed?: Yes Date of Specialist follow-up appointment?: 11/25/23 Follow-Up Specialty Provider:: [id number] Montie Bologna infectious disease/ Awaiting call back from Dr Harden office Do you need transportation to your follow-up appointment?: No Do you understand care options if your condition(s) worsen?: Yes-patient verbalized understanding  SDOH Interventions Today    Flowsheet Row Most Recent Value  SDOH Interventions   Food Insecurity Interventions Intervention Not Indicated  Housing Interventions Intervention Not Indicated  Transportation Interventions Intervention Not Indicated, Patient Resources (Friends/Family)  Utilities Interventions Intervention Not Indicated    Goals Addressed             This Visit's Progress    VBCI Transitions of Care (TOC) Care Plan       Problems:  Recent Hospitalization for treatment of Abscess of Lt hand Knowledge Deficit Related to Abscess of  left hand and Medication management barrier  two medications patient was taking before admitted was omitted from list. Patient daughter will call PCP and see if she wants her to take them.   Goal:  Over the next 30 days, the patient will not experience hospital readmission  Interventions:  Transitions of Care: Doctor Visits  - discussed the importance of doctor visits Contacted Health RN/OT/PT - Hedda will call the patient daughter back.  Contacted DME company for patient use of equipment Patient daughter has put in a call to see when the hospital bed will be delivered.   Patient Self Care Activities:  Attend all scheduled provider appointments Call pharmacy for medication refills 3-7 days in advance of running out of medications Call provider office for new concerns or questions  Notify RN Care Manager of TOC call rescheduling needs Participate in Transition of Care Program/Attend TOC scheduled calls Take medications as prescribed   LUE Continue to keep the drain in her hand ado do daily basic dressing changes . Use Vashe if possible.   Plan:  An initial telephone outreach has been scheduled for: 11/16/2023 2:00 Next PCP appointment scheduled for: [id number] Infectious disease Appointment [phone] Telephone follow up appointment with care management team member scheduled for:  *[phone] [place] 2:00 Daughter will contact PCP regarding medications that are not on discharge list patient was taking before admission Patient daughter has called and left message for bed delivery       Cathlean Headland BSN RN [facility] Care Management Coordinator [email or web address] Direct Dial : [phone]  Fax: [phone] Website: [email or web address]

## 2023-11-09 ENCOUNTER — Other Ambulatory Visit: Payer: Self-pay | Admitting: Orthopedic Surgery

## 2023-11-09 ENCOUNTER — Other Ambulatory Visit: Payer: Self-pay | Admitting: Family Medicine

## 2023-11-09 ENCOUNTER — Telehealth: Payer: Self-pay | Admitting: Orthopedic Surgery

## 2023-11-09 DIAGNOSIS — F32A Depression, unspecified: Secondary | ICD-10-CM

## 2023-11-09 DIAGNOSIS — N2581 Secondary hyperparathyroidism of renal origin: Secondary | ICD-10-CM | POA: Diagnosis not present

## 2023-11-09 DIAGNOSIS — E1151 Type 2 diabetes mellitus with diabetic peripheral angiopathy without gangrene: Secondary | ICD-10-CM | POA: Diagnosis not present

## 2023-11-09 DIAGNOSIS — D631 Anemia in chronic kidney disease: Secondary | ICD-10-CM | POA: Diagnosis not present

## 2023-11-09 DIAGNOSIS — E1122 Type 2 diabetes mellitus with diabetic chronic kidney disease: Secondary | ICD-10-CM | POA: Diagnosis not present

## 2023-11-09 DIAGNOSIS — L03114 Cellulitis of left upper limb: Secondary | ICD-10-CM | POA: Diagnosis not present

## 2023-11-09 DIAGNOSIS — I480 Paroxysmal atrial fibrillation: Secondary | ICD-10-CM | POA: Diagnosis not present

## 2023-11-09 DIAGNOSIS — I5032 Chronic diastolic (congestive) heart failure: Secondary | ICD-10-CM | POA: Diagnosis not present

## 2023-11-09 DIAGNOSIS — T8742 Infection of amputation stump, left upper extremity: Secondary | ICD-10-CM | POA: Diagnosis not present

## 2023-11-09 DIAGNOSIS — G4733 Obstructive sleep apnea (adult) (pediatric): Secondary | ICD-10-CM | POA: Diagnosis not present

## 2023-11-09 DIAGNOSIS — J9811 Atelectasis: Secondary | ICD-10-CM | POA: Diagnosis not present

## 2023-11-09 DIAGNOSIS — Z89511 Acquired absence of right leg below knee: Secondary | ICD-10-CM | POA: Diagnosis not present

## 2023-11-09 DIAGNOSIS — E782 Mixed hyperlipidemia: Secondary | ICD-10-CM | POA: Diagnosis not present

## 2023-11-09 DIAGNOSIS — L02512 Cutaneous abscess of left hand: Secondary | ICD-10-CM | POA: Diagnosis not present

## 2023-11-09 DIAGNOSIS — I350 Nonrheumatic aortic (valve) stenosis: Secondary | ICD-10-CM | POA: Diagnosis not present

## 2023-11-09 DIAGNOSIS — Z8673 Personal history of transient ischemic attack (TIA), and cerebral infarction without residual deficits: Secondary | ICD-10-CM | POA: Diagnosis not present

## 2023-11-09 DIAGNOSIS — I70203 Unspecified atherosclerosis of native arteries of extremities, bilateral legs: Secondary | ICD-10-CM | POA: Diagnosis not present

## 2023-11-09 DIAGNOSIS — N186 End stage renal disease: Secondary | ICD-10-CM | POA: Diagnosis not present

## 2023-11-09 DIAGNOSIS — B9561 Methicillin susceptible Staphylococcus aureus infection as the cause of diseases classified elsewhere: Secondary | ICD-10-CM | POA: Diagnosis not present

## 2023-11-09 DIAGNOSIS — I132 Hypertensive heart and chronic kidney disease with heart failure and with stage 5 chronic kidney disease, or end stage renal disease: Secondary | ICD-10-CM | POA: Diagnosis not present

## 2023-11-09 DIAGNOSIS — K219 Gastro-esophageal reflux disease without esophagitis: Secondary | ICD-10-CM | POA: Diagnosis not present

## 2023-11-09 MED ORDER — OXYCODONE-ACETAMINOPHEN 5-325 MG PO TABS: 1.0000 | ORAL_TABLET | Freq: Three times a day (TID) | ORAL | 0 refills | Status: DC | PRN

## 2023-11-09 NOTE — Telephone Encounter (Signed)
 VO given.

## 2023-11-09 NOTE — Telephone Encounter (Signed)
 Patient daughter called and ask if its any way she can get the oxycodone  for her pain for when she goes to PT. It helps her better. CB#[phone]

## 2023-11-09 NOTE — Telephone Encounter (Signed)
 done

## 2023-11-09 NOTE — Telephone Encounter (Signed)
 Scheduled

## 2023-11-09 NOTE — Telephone Encounter (Signed)
 Please see message below. Last refill 09/29/2023 # 20 s/p finger amputation

## 2023-11-09 NOTE — Addendum Note (Signed)
 Addended by: Yancy Hascall R on: 11/09/2023 10:06 AM   Modules accepted: Orders

## 2023-11-09 NOTE — Telephone Encounter (Signed)
 Done

## 2023-11-10 ENCOUNTER — Other Ambulatory Visit: Payer: Self-pay

## 2023-11-10 ENCOUNTER — Observation Stay (HOSPITAL_COMMUNITY)
Admission: EM | Admit: 2023-11-10 | Discharge: 2023-11-13 | Disposition: A | Discharge status: Home / Self Care | Source: Other Acute Inpatient Hospital | Attending: Family Medicine | Admitting: Family Medicine

## 2023-11-10 ENCOUNTER — Emergency Department (HOSPITAL_COMMUNITY)

## 2023-11-10 DIAGNOSIS — I4891 Unspecified atrial fibrillation: Secondary | ICD-10-CM | POA: Diagnosis present

## 2023-11-10 DIAGNOSIS — R918 Other nonspecific abnormal finding of lung field: Secondary | ICD-10-CM | POA: Diagnosis not present

## 2023-11-10 DIAGNOSIS — I132 Hypertensive heart and chronic kidney disease with heart failure and with stage 5 chronic kidney disease, or end stage renal disease: Secondary | ICD-10-CM | POA: Diagnosis not present

## 2023-11-10 DIAGNOSIS — R5383 Other fatigue: Principal | ICD-10-CM | POA: Insufficient documentation

## 2023-11-10 DIAGNOSIS — E1169 Type 2 diabetes mellitus with other specified complication: Secondary | ICD-10-CM | POA: Diagnosis present

## 2023-11-10 DIAGNOSIS — L02512 Cutaneous abscess of left hand: Secondary | ICD-10-CM

## 2023-11-10 DIAGNOSIS — I5032 Chronic diastolic (congestive) heart failure: Secondary | ICD-10-CM | POA: Diagnosis present

## 2023-11-10 DIAGNOSIS — E785 Hyperlipidemia, unspecified: Secondary | ICD-10-CM | POA: Diagnosis not present

## 2023-11-10 DIAGNOSIS — Z6825 Body mass index (BMI) 25.0-25.9, adult: Secondary | ICD-10-CM | POA: Insufficient documentation

## 2023-11-10 DIAGNOSIS — R6510 Systemic inflammatory response syndrome (SIRS) of non-infectious origin without acute organ dysfunction: Secondary | ICD-10-CM | POA: Diagnosis present

## 2023-11-10 DIAGNOSIS — B9561 Methicillin susceptible Staphylococcus aureus infection as the cause of diseases classified elsewhere: Secondary | ICD-10-CM

## 2023-11-10 DIAGNOSIS — S88912A Complete traumatic amputation of left lower leg, level unspecified, initial encounter: Secondary | ICD-10-CM | POA: Diagnosis not present

## 2023-11-10 DIAGNOSIS — E11649 Type 2 diabetes mellitus with hypoglycemia without coma: Secondary | ICD-10-CM | POA: Insufficient documentation

## 2023-11-10 DIAGNOSIS — I96 Gangrene, not elsewhere classified: Secondary | ICD-10-CM | POA: Diagnosis present

## 2023-11-10 DIAGNOSIS — D72829 Elevated white blood cell count, unspecified: Secondary | ICD-10-CM | POA: Diagnosis not present

## 2023-11-10 DIAGNOSIS — E1122 Type 2 diabetes mellitus with diabetic chronic kidney disease: Secondary | ICD-10-CM | POA: Insufficient documentation

## 2023-11-10 DIAGNOSIS — N186 End stage renal disease: Secondary | ICD-10-CM | POA: Insufficient documentation

## 2023-11-10 DIAGNOSIS — Z794 Long term (current) use of insulin: Secondary | ICD-10-CM | POA: Diagnosis not present

## 2023-11-10 DIAGNOSIS — R410 Disorientation, unspecified: Secondary | ICD-10-CM | POA: Diagnosis present

## 2023-11-10 DIAGNOSIS — F32A Depression, unspecified: Secondary | ICD-10-CM | POA: Diagnosis present

## 2023-11-10 DIAGNOSIS — Y849 Medical procedure, unspecified as the cause of abnormal reaction of the patient, or of later complication, without mention of misadventure at the time of the procedure: Secondary | ICD-10-CM | POA: Insufficient documentation

## 2023-11-10 DIAGNOSIS — E119 Type 2 diabetes mellitus without complications: Secondary | ICD-10-CM

## 2023-11-10 DIAGNOSIS — Z7982 Long term (current) use of aspirin: Secondary | ICD-10-CM | POA: Insufficient documentation

## 2023-11-10 DIAGNOSIS — D638 Anemia in other chronic diseases classified elsewhere: Secondary | ICD-10-CM | POA: Diagnosis present

## 2023-11-10 DIAGNOSIS — D649 Anemia, unspecified: Secondary | ICD-10-CM | POA: Diagnosis present

## 2023-11-10 DIAGNOSIS — K648 Other hemorrhoids: Secondary | ICD-10-CM | POA: Insufficient documentation

## 2023-11-10 DIAGNOSIS — I10 Essential (primary) hypertension: Secondary | ICD-10-CM | POA: Diagnosis present

## 2023-11-10 DIAGNOSIS — F03918 Unspecified dementia, unspecified severity, with other behavioral disturbance: Secondary | ICD-10-CM | POA: Insufficient documentation

## 2023-11-10 DIAGNOSIS — Z89519 Acquired absence of unspecified leg below knee: Secondary | ICD-10-CM | POA: Diagnosis not present

## 2023-11-10 DIAGNOSIS — D631 Anemia in chronic kidney disease: Secondary | ICD-10-CM | POA: Diagnosis not present

## 2023-11-10 DIAGNOSIS — Z992 Dependence on renal dialysis: Secondary | ICD-10-CM | POA: Diagnosis not present

## 2023-11-10 DIAGNOSIS — N3001 Acute cystitis with hematuria: Secondary | ICD-10-CM | POA: Diagnosis not present

## 2023-11-10 DIAGNOSIS — Z89511 Acquired absence of right leg below knee: Secondary | ICD-10-CM | POA: Insufficient documentation

## 2023-11-10 DIAGNOSIS — D500 Iron deficiency anemia secondary to blood loss (chronic): Secondary | ICD-10-CM | POA: Diagnosis not present

## 2023-11-10 DIAGNOSIS — J9811 Atelectasis: Secondary | ICD-10-CM | POA: Diagnosis not present

## 2023-11-10 DIAGNOSIS — E162 Hypoglycemia, unspecified: Secondary | ICD-10-CM

## 2023-11-10 DIAGNOSIS — N2581 Secondary hyperparathyroidism of renal origin: Secondary | ICD-10-CM | POA: Diagnosis not present

## 2023-11-10 DIAGNOSIS — G9389 Other specified disorders of brain: Secondary | ICD-10-CM | POA: Diagnosis not present

## 2023-11-10 DIAGNOSIS — R2689 Other abnormalities of gait and mobility: Secondary | ICD-10-CM | POA: Insufficient documentation

## 2023-11-10 DIAGNOSIS — D509 Iron deficiency anemia, unspecified: Secondary | ICD-10-CM | POA: Insufficient documentation

## 2023-11-10 DIAGNOSIS — Z79899 Other long term (current) drug therapy: Secondary | ICD-10-CM | POA: Insufficient documentation

## 2023-11-10 DIAGNOSIS — R8290 Unspecified abnormal findings in urine: Secondary | ICD-10-CM | POA: Diagnosis not present

## 2023-11-10 DIAGNOSIS — F322 Major depressive disorder, single episode, severe without psychotic features: Secondary | ICD-10-CM | POA: Diagnosis present

## 2023-11-10 DIAGNOSIS — F411 Generalized anxiety disorder: Secondary | ICD-10-CM | POA: Diagnosis not present

## 2023-11-10 DIAGNOSIS — G894 Chronic pain syndrome: Secondary | ICD-10-CM | POA: Diagnosis present

## 2023-11-10 DIAGNOSIS — E1165 Type 2 diabetes mellitus with hyperglycemia: Secondary | ICD-10-CM | POA: Diagnosis present

## 2023-11-10 DIAGNOSIS — I639 Cerebral infarction, unspecified: Secondary | ICD-10-CM | POA: Diagnosis present

## 2023-11-10 DIAGNOSIS — F321 Major depressive disorder, single episode, moderate: Secondary | ICD-10-CM | POA: Diagnosis present

## 2023-11-10 DIAGNOSIS — Z7901 Long term (current) use of anticoagulants: Secondary | ICD-10-CM | POA: Insufficient documentation

## 2023-11-10 DIAGNOSIS — R9082 White matter disease, unspecified: Secondary | ICD-10-CM | POA: Diagnosis not present

## 2023-11-10 DIAGNOSIS — L03114 Cellulitis of left upper limb: Secondary | ICD-10-CM | POA: Diagnosis not present

## 2023-11-10 DIAGNOSIS — I480 Paroxysmal atrial fibrillation: Secondary | ICD-10-CM | POA: Diagnosis present

## 2023-11-10 LAB — COMPREHENSIVE METABOLIC PANEL WITH GFR
ALT: <5 U/L (ref 0–44)
AST: 19 U/L (ref 15–41)
Albumin: 2.4 g/dL — ABNORMAL LOW (ref 3.5–5.0)
Alkaline Phosphatase: 102 U/L (ref 38–126)
Anion gap: 13 (ref 5–15)
BUN: 21 mg/dL (ref 8–23)
CO2: 27 mmol/L (ref 22–32)
Calcium: 8.2 mg/dL — ABNORMAL LOW (ref 8.9–10.3)
Chloride: 97 mmol/L — ABNORMAL LOW (ref 98–111)
Creatinine, Ser: 2.81 mg/dL — ABNORMAL HIGH (ref 0.44–1.00)
GFR, Estimated: 18 mL/min — ABNORMAL LOW (ref >60)
Glucose, Bld: 73 mg/dL (ref 70–99)
Potassium: 3.5 mmol/L (ref 3.5–5.1)
Sodium: 137 mmol/L (ref 135–145)
Total Bilirubin: 0.4 mg/dL (ref 0.0–1.2)
Total Protein: 6.2 g/dL — ABNORMAL LOW (ref 6.5–8.1)

## 2023-11-10 LAB — CBC
HCT: 33.1 % — ABNORMAL LOW (ref 36.0–46.0)
Hemoglobin: 10.3 g/dL — ABNORMAL LOW (ref 12.0–15.0)
MCH: 24.6 pg — ABNORMAL LOW (ref 26.0–34.0)
MCHC: 31.1 g/dL (ref 30.0–36.0)
MCV: 79 fL — ABNORMAL LOW (ref 80.0–100.0)
Platelets: 381 K/uL (ref 150–400)
RBC: 4.19 MIL/uL (ref 3.87–5.11)
RDW: 19.8 % — ABNORMAL HIGH (ref 11.5–15.5)
WBC: 17.6 K/uL — ABNORMAL HIGH (ref 4.0–10.5)
nRBC: 0.8 % — ABNORMAL HIGH (ref 0.0–0.2)

## 2023-11-10 LAB — URINALYSIS, ROUTINE W REFLEX MICROSCOPIC
Bilirubin Urine: NEGATIVE
Glucose, UA: NEGATIVE mg/dL
Ketones, ur: NEGATIVE mg/dL
Nitrite: NEGATIVE
Protein, ur: 100 mg/dL — AB
RBC / HPF: >50 RBC/hpf (ref 0–5)
Specific Gravity, Urine: 1.017 (ref 1.005–1.030)
WBC, UA: >50 WBC/hpf (ref 0–5)
pH: 5 (ref 5.0–8.0)

## 2023-11-10 LAB — PROTIME-INR
INR: 1.3 — ABNORMAL HIGH (ref 0.8–1.2)
Prothrombin Time: 17.4 s — ABNORMAL HIGH (ref 11.4–15.2)

## 2023-11-10 LAB — CBG MONITORING, ED
Glucose-Capillary: 63 mg/dL — ABNORMAL LOW (ref 70–99)
Glucose-Capillary: 68 mg/dL — ABNORMAL LOW (ref 70–99)
Glucose-Capillary: 115 mg/dL — ABNORMAL HIGH (ref 70–99)
Glucose-Capillary: 140 mg/dL — ABNORMAL HIGH (ref 70–99)

## 2023-11-10 LAB — LACTIC ACID, PLASMA
Lactic Acid, Venous: 1.7 mmol/L (ref 0.5–1.9)
Lactic Acid, Venous: 1.7 mmol/L (ref 0.5–1.9)

## 2023-11-10 LAB — GLUCOSE, CAPILLARY
Glucose-Capillary: 63 mg/dL — ABNORMAL LOW (ref 70–99)
Glucose-Capillary: 113 mg/dL — ABNORMAL HIGH (ref 70–99)

## 2023-11-10 LAB — AMMONIA: Ammonia: 17 umol/L (ref 9–35)

## 2023-11-10 LAB — MAGNESIUM: Magnesium: 1.9 mg/dL (ref 1.7–2.4)

## 2023-11-10 LAB — PHOSPHORUS: Phosphorus: 1.7 mg/dL — ABNORMAL LOW (ref 2.5–4.6)

## 2023-11-10 MED ORDER — TORSEMIDE 20 MG PO TABS
100.0000 mg | ORAL_TABLET | ORAL | Status: DC
  Administered 2023-11-11 – 2023-11-13 (×2): 100 mg via ORAL
  Filled 2023-11-10 (×2): qty 5

## 2023-11-10 MED ORDER — HYDROMORPHONE HCL 1 MG/ML IJ SOLN
0.5000 mg | INTRAMUSCULAR | Status: DC | PRN
  Administered 2023-11-12: 1 mg via INTRAVENOUS
  Administered 2023-11-13: 0.5 mg via INTRAVENOUS
  Filled 2023-11-10 (×2): qty 1

## 2023-11-10 MED ORDER — SODIUM CHLORIDE 0.9% FLUSH
3.0000 mL | Freq: Two times a day (BID) | INTRAVENOUS | Status: DC
  Administered 2023-11-10 – 2023-11-13 (×5): 3 mL via INTRAVENOUS

## 2023-11-10 MED ORDER — INSULIN ASPART 100 UNIT/ML IJ SOLN
0.0000 [IU] | Freq: Three times a day (TID) | INTRAMUSCULAR | Status: DC
  Administered 2023-11-11: 1 [IU] via SUBCUTANEOUS

## 2023-11-10 MED ORDER — SUCROFERRIC OXYHYDROXIDE 500 MG PO CHEW
500.0000 mg | CHEWABLE_TABLET | ORAL | Status: DC
  Administered 2023-11-10 – 2023-11-11 (×4): 500 mg via ORAL
  Filled 2023-11-10 (×9): qty 1

## 2023-11-10 MED ORDER — CALCITRIOL 0.25 MCG PO CAPS
1.0000 ug | ORAL_CAPSULE | ORAL | Status: DC
  Administered 2023-11-12: 1 ug via ORAL

## 2023-11-10 MED ORDER — ONDANSETRON HCL 4 MG PO TABS: 4.0000 mg | ORAL_TABLET | Freq: Four times a day (QID) | ORAL | Status: DC | PRN

## 2023-11-10 MED ORDER — AMLODIPINE BESYLATE 5 MG PO TABS
5.0000 mg | ORAL_TABLET | Freq: Every day | ORAL | Status: DC
  Filled 2023-11-10: qty 1

## 2023-11-10 MED ORDER — EZETIMIBE 10 MG PO TABS
10.0000 mg | ORAL_TABLET | Freq: Every day | ORAL | Status: DC
  Administered 2023-11-10 – 2023-11-13 (×3): 10 mg via ORAL
  Filled 2023-11-10 (×3): qty 1

## 2023-11-10 MED ORDER — APIXABAN 5 MG PO TABS
5.0000 mg | ORAL_TABLET | Freq: Two times a day (BID) | ORAL | Status: DC
  Administered 2023-11-10: 5 mg via ORAL
  Filled 2023-11-10 (×2): qty 1

## 2023-11-10 MED ORDER — ONDANSETRON HCL 4 MG/2ML IJ SOLN: 4.0000 mg | Freq: Four times a day (QID) | INTRAMUSCULAR | Status: DC | PRN

## 2023-11-10 MED ORDER — HYDRALAZINE HCL 20 MG/ML IJ SOLN: 10.0000 mg | INTRAMUSCULAR | Status: DC | PRN

## 2023-11-10 MED ORDER — METOPROLOL SUCCINATE ER 50 MG PO TB24
50.0000 mg | ORAL_TABLET | ORAL | Status: DC
  Filled 2023-11-10: qty 1

## 2023-11-10 MED ORDER — HEPARIN SODIUM (PORCINE) 5000 UNIT/ML IJ SOLN: 5000.0000 [IU] | Freq: Three times a day (TID) | INTRAMUSCULAR | Status: DC

## 2023-11-10 MED ORDER — SODIUM CHLORIDE 0.9 % IV SOLN
2.0000 g | INTRAVENOUS | Status: DC
  Administered 2023-11-10: 2 g via INTRAVENOUS
  Filled 2023-11-10: qty 20

## 2023-11-10 MED ORDER — IPRATROPIUM BROMIDE 0.02 % IN SOLN
0.5000 mg | Freq: Four times a day (QID) | RESPIRATORY_TRACT | Status: DC | PRN
  Administered 2023-11-12: 0.5 mg via RESPIRATORY_TRACT
  Filled 2023-11-10: qty 2.5

## 2023-11-10 MED ORDER — INSULIN ASPART 100 UNIT/ML IJ SOLN: 0.0000 [IU] | Freq: Every day | INTRAMUSCULAR | Status: DC

## 2023-11-10 MED ORDER — ACETAMINOPHEN 650 MG RE SUPP: 650.0000 mg | Freq: Four times a day (QID) | RECTAL | Status: DC | PRN

## 2023-11-10 MED ORDER — SODIUM CHLORIDE 0.9 % IV SOLN: INTRAVENOUS | Status: AC

## 2023-11-10 MED ORDER — ACETAMINOPHEN 325 MG PO TABS
650.0000 mg | ORAL_TABLET | Freq: Four times a day (QID) | ORAL | Status: DC | PRN
  Administered 2023-11-11: 650 mg via ORAL
  Filled 2023-11-10 (×2): qty 2

## 2023-11-10 MED ORDER — SENNOSIDES-DOCUSATE SODIUM 8.6-50 MG PO TABS: 1.0000 | ORAL_TABLET | Freq: Every evening | ORAL | Status: DC | PRN

## 2023-11-10 MED ORDER — SERTRALINE HCL 50 MG PO TABS
50.0000 mg | ORAL_TABLET | Freq: Every day | ORAL | Status: DC
  Administered 2023-11-10 – 2023-11-13 (×3): 50 mg via ORAL
  Filled 2023-11-10 (×3): qty 1

## 2023-11-10 MED ORDER — FLEET ENEMA RE ENEM: 1.0000 | ENEMA | Freq: Once | RECTAL | Status: DC | PRN

## 2023-11-10 MED ORDER — CEFAZOLIN SODIUM-DEXTROSE 2-4 GM/100ML-% IV SOLN
2.0000 g | INTRAVENOUS | Status: DC
  Filled 2023-11-10: qty 100

## 2023-11-10 MED ORDER — BISACODYL 5 MG PO TBEC: 5.0000 mg | DELAYED_RELEASE_TABLET | Freq: Every day | ORAL | Status: DC | PRN

## 2023-11-10 MED ORDER — SODIUM CHLORIDE 0.9 % IV SOLN
1.0000 g | Freq: Once | INTRAVENOUS | Status: AC
  Administered 2023-11-10: 1 g via INTRAVENOUS
  Filled 2023-11-10: qty 10

## 2023-11-10 MED ORDER — SODIUM CHLORIDE 0.9 % IV SOLN: INTRAVENOUS | Status: DC

## 2023-11-10 MED ORDER — SUCROFERRIC OXYHYDROXIDE 500 MG PO CHEW
1000.0000 mg | CHEWABLE_TABLET | Freq: Three times a day (TID) | ORAL | Status: DC
  Administered 2023-11-10 – 2023-11-12 (×5): 1000 mg via ORAL
  Filled 2023-11-10 (×9): qty 2

## 2023-11-10 MED ORDER — FLORANEX PO PACK
1.0000 g | PACK | Freq: Three times a day (TID) | ORAL | Status: DC
  Administered 2023-11-10 – 2023-11-13 (×8): 1 g via ORAL
  Filled 2023-11-10 (×7): qty 1

## 2023-11-10 MED ORDER — OXYCODONE HCL 5 MG PO TABS
5.0000 mg | ORAL_TABLET | ORAL | Status: DC | PRN
  Administered 2023-11-10 – 2023-11-13 (×4): 5 mg via ORAL
  Filled 2023-11-10 (×4): qty 1

## 2023-11-10 MED ORDER — CINACALCET HCL 30 MG PO TABS
90.0000 mg | ORAL_TABLET | ORAL | Status: DC
Start: 2023-11-10 — End: 2023-11-13
  Administered 2023-11-10 – 2023-11-12 (×2): 90 mg via ORAL
  Filled 2023-11-10 (×2): qty 3

## 2023-11-10 MED ORDER — BUSPIRONE HCL 5 MG PO TABS
5.0000 mg | ORAL_TABLET | Freq: Two times a day (BID) | ORAL | Status: DC
  Administered 2023-11-10 – 2023-11-13 (×5): 5 mg via ORAL
  Filled 2023-11-10 (×6): qty 1

## 2023-11-10 MED ORDER — TRAZODONE HCL 50 MG PO TABS: 25.0000 mg | ORAL_TABLET | Freq: Every evening | ORAL | Status: DC | PRN

## 2023-11-10 MED ORDER — LEVALBUTEROL HCL 0.63 MG/3ML IN NEBU
0.6300 mg | INHALATION_SOLUTION | Freq: Four times a day (QID) | RESPIRATORY_TRACT | Status: DC | PRN
  Administered 2023-11-12: 0.63 mg via RESPIRATORY_TRACT
  Filled 2023-11-10: qty 3

## 2023-11-10 NOTE — ED Notes (Signed)
 Pt eating sandwich and drinking per PA request. Pt states I feel a little bit better

## 2023-11-10 NOTE — Assessment & Plan Note (Signed)
-   Blood pressure stable, reviewing home medication, continue Norvasc , metoprolol ,

## 2023-11-10 NOTE — Progress Notes (Signed)
 Pt receives OP HD at [facility], MWF 0700 chair time. Will assist as needed.   Lavanda Estephania Licciardi Dialysis Navigator [phone]

## 2023-11-10 NOTE — Assessment & Plan Note (Signed)
-   Heart rate controlled, continue Eliquis , metoprolol

## 2023-11-10 NOTE — ED Notes (Addendum)
 See triage notes. Pt a/o x 4. States I'm just tired and sleepy  denies dizziness/headache or vision changes. Denies weakness on one side more than the other. Pt moving all ext. NIH negative. Dialysis needle still accessed in bottom site. Taken out and held pressure with surgifoam and gauze for 5 minutes then pressure dressing applied. Bleeding controlled.

## 2023-11-10 NOTE — ED Triage Notes (Signed)
 Pt arrived via [facility] from dialysis, from home, pt was A&O this morning 0645 am before Tx and become lethargic and zoned out around 0800 this morning and was slow to respond, pt states she feels a little sleepy but able to answer EMSs questions, cbg 164. SpO2 on RA 98%. Pt did not complete dialysis due to this as dialysis called EMS for pt to be further evaluated. Vitals WNL per EM. Pt is A&O x 4 but a little slow to respond at times. Pt's L arm fistula still accessed from her dialysis appt, clamped.

## 2023-11-10 NOTE — Assessment & Plan Note (Signed)
-   Stable, continue antibiotics - Home antibiotics Ancef  with hemodialysis TTS -Continue Rocephin  for now

## 2023-11-10 NOTE — Assessment & Plan Note (Signed)
 Monitoring H&H closely

## 2023-11-10 NOTE — Hospital Course (Addendum)
 Kathryn Beck is a 68 year old female with significant history of Atrial fib on apixaban , PAD  (s/p R BKA in 05/2022, prior lithotripsy and stenting of the left superficial femoral and popliteal artery in 06/2022 and ultimately L BKA in 09/2022), HTN, HLD, Type 2 DM, bilateral adrenal adenoma (followed by Endocrinology), chronic HFpEF, aortic stenosis, OSA, ESRD (MWF), history of TIA's, history of recurrent chest pain  (cath in 12/2018 showing normal coronary arteries) recent hospitalization for left hand I&D, MSSA infection on antibiotics IV Ancef  with HD... Presented from hemodialysis today for drowsiness, confusion. Upon applying the patient was found redly, lethargic.[name]  8 AM, today's dialysis was started at 6:45, was cut short due to her change in status.   ED evaluation:  Blood pressure (!) 162/68, pulse 71, temperature 98.5 F (36.9 C), temperature source Oral, resp. rate 19, SpO2 100%.  CBG: 63, 68, 115 Labs: Chloride 97 creatinine 2.18, calcium  8.2 albumin  2.4 GFR 9, 18, CBC 16.7, 17.6, hemoglobin 10.3 UA: Amber, large hemoglobin, moderate leukocytes, moderate protein, many bacteria, WBC> 50   Requested patient to be admitted for altered mental status, confusion, likely complicated by UTI and hypoglycemia

## 2023-11-10 NOTE — ED Notes (Signed)
 ID consult  in progress.

## 2023-11-10 NOTE — Assessment & Plan Note (Signed)
-   As needed benzodiazepines, will monitor due to confusion

## 2023-11-10 NOTE — Assessment & Plan Note (Signed)
-   Last A1c 6.7 -Hypoglycemic this a.m. CBG (last 3)  Recent Labs    11/10/23 0933 11/10/23 1046 11/10/23 1136  GLUCAP 63* 68* 115*   - Holding home medication of insulin  glargine for now - Checking CBG q. ACHS, SSI coverage

## 2023-11-10 NOTE — ED Notes (Signed)
CBG 63  

## 2023-11-10 NOTE — ED Notes (Signed)
 Pt eating graham crackers with grape juice at this time.

## 2023-11-10 NOTE — Assessment & Plan Note (Signed)
-   On hemodialysis MWF -Partial hemodialysis today -Will notify and consult nephrology for continued dialysis

## 2023-11-10 NOTE — ED Notes (Signed)
 Pt had a cup of orange juice.

## 2023-11-10 NOTE — H&P (Addendum)
 History and Physical   Patient: Kathryn Beck                            PCP: Antonetta Rollene BRAVO, MD                    DOB: 1956-03-01            DOA: 11/10/2023 FMW:[id number]             DOS: 11/10/2023, 1:26 PM  Antonetta Rollene BRAVO, MD  Patient coming from:   HOME  I have personally reviewed patient's medical records, in electronic medical records, including:  [place] link, and care everywhere.    Chief Complaint:   Chief Complaint  Patient presents with   Fatigue    History of present illness:    Kathryn Beck is a 68 year old female with significant history of Atrial fib on apixaban , PAD  (s/p R BKA in 05/2022, prior lithotripsy and stenting of the left superficial femoral and popliteal artery in 06/2022 and ultimately L BKA in 09/2022), HTN, HLD, Type 2 DM, bilateral adrenal adenoma (followed by Endocrinology), chronic HFpEF, aortic stenosis, OSA, ESRD (MWF), history of TIA's, history of recurrent chest pain  (cath in 12/2018 showing normal coronary arteries) recent hospitalization for left hand I&D, MSSA infection on antibiotics IV Ancef  with HD... Presented from hemodialysis today for drowsiness, confusion. Upon applying the patient was found redly, lethargic.Kathryn Beck  8 AM, today's dialysis was started at 6:45, was cut short due to her change in status.   ED evaluation:  Blood pressure (!) 162/68, pulse 71, temperature 98.5 F (36.9 C), temperature source Oral, resp. rate 19, SpO2 100%.  CBG: 63, 68, 115 Labs: Chloride 97 creatinine 2.18, calcium  8.2 albumin  2.4 GFR 9, 18, CBC 16.7, 17.6, hemoglobin 10.3 UA: Amber, large hemoglobin, moderate leukocytes, moderate protein, many bacteria, WBC> 50   Requested patient to be admitted for altered mental status, confusion, likely complicated by UTI and hypoglycemia    Patient Denies having: Fever, Chills, Cough, SOB, Chest Pain, Abd pain, N/V/D, headache, dizziness, lightheadedness,  Dysuria, Joint pain, rash, open  wounds    Review of Systems: As per HPI, otherwise 10 point review of systems were negative.   ----------------------------------------------------------------------------------------------------------------------  Allergies  Allergen Reactions   Ace Inhibitors Anaphylaxis and Swelling   Penicillins Itching and Swelling    Has tolerated cefazolin  on multiple occasions    Statins Other (See Comments)    Elevated LFT's     Albuterol  Swelling    Home MEDs:  Prior to Admission medications   Medication Sig Start Date End Date Taking? Authorizing Provider  acetaminophen  (TYLENOL ) 650 MG CR tablet Take 1,300 mg by mouth every 8 (eight) hours as needed for pain.    [provider]  amLODipine  (NORVASC ) 10 MG tablet Take 0.5 tablets (5 mg total) by mouth daily. 10/28/23   Pearlean Manus, MD  apixaban  (ELIQUIS ) 5 MG TABS tablet Take 1 tablet (5 mg total) by mouth 2 (two) times daily. For Afib 10/29/23   Pearlean Manus, MD  aspirin  81 MG chewable tablet Chew 2 tablets (162 mg total) by mouth daily. Patient taking differently: Chew 81 mg by mouth 2 (two) times daily. 09/16/23   Bevely Doffing, FNP  B Complex-C-Folic Acid  (RENA-VITE RX) 1 MG TABS Take 1 tablet by mouth daily. 04/15/22   [provider]  busPIRone  (BUSPAR ) 5 MG tablet TAKE ONE TABLET BY MOUTH  TWICE DAILY 10/20/23   Antonetta Rollene BRAVO, MD  calcitRIOL  (ROCALTROL ) 0.5 MCG capsule Take 2 capsules (1 mcg total) by mouth every Monday, Wednesday, and Friday with hemodialysis. 11/05/23   Caleen Burgess BROCKS, MD  ceFAZolin  (ANCEF ) 2-4 GM/100ML-% IVPB Inject 100 mLs (2 g total) into the vein every Monday, Wednesday, and Friday with hemodialysis for 20 days. 11/05/23 11/25/23  Amin, Ankit C, MD  cinacalcet  (SENSIPAR ) 30 MG tablet Take 3 tablets (90 mg total) by mouth 3 (three) times a week. Patient taking differently: Take 90 mg by mouth every Monday, Wednesday, and Friday. 09/09/22   Sherrill Cable Latif, DO  Darbepoetin Alfa  (ARANESP )  40 MCG/0.4ML SOSY injection Inject 0.4 mLs (40 mcg total) into the skin every Friday at 6 PM. 11/05/23   Amin, Ankit C, MD  DULoxetine  (CYMBALTA ) 60 MG capsule TAKE (1) CAPSULE BY MOUTH ONCE DAILY. 04/05/23   Antonetta Rollene BRAVO, MD  ezetimibe  (ZETIA ) 10 MG tablet Take 1 tablet (10 mg total) by mouth daily. 04/27/23   Antonetta Rollene BRAVO, MD  insulin  glargine, 2 Unit Dial , (TOUJEO  MAX SOLOSTAR) 300 UNIT/ML Solostar Pen Inject 30 Units into the skin daily. May need to slowly increase the dose depending upon your blood sugar, follow-up with PCP 01/01/23   Trixie File, MD  metoprolol  succinate (TOPROL -XL) 50 MG 24 hr tablet TAKE (1) TABLET BY MOUTH DAILY WITH FOOD *TAKE AFTER DIALYSIS* Patient taking differently: Take 50 mg by mouth Every Tuesday,Thursday,and Saturday with dialysis. Take 50mg  (1 tablets) by mouth on non-dialysis days. (Sunday, Tuesday, Thursday, Saturday). 01/05/23   Antonetta Rollene BRAVO, MD  sertraline  (ZOLOFT ) 50 MG tablet TAKE ONE TABLET BY MOUTH ONCE DAILY 11/09/23   Antonetta Rollene BRAVO, MD  torsemide  (DEMADEX ) 100 MG tablet Take 1 tablet (100 mg total) by mouth every Tuesday, Thursday, Saturday, and Sunday. 10/28/23   Pearlean Manus, MD  [name]  500 MG chewable tablet Chew 1-2 tablets (500-1,000 mg total) by mouth See admin instructions. Take 1000mg  (2 tablets) by mouth with meals and 500mg  (1 tablet) with snacks 08/23/23   Antonetta Rollene BRAVO, MD  FLUoxetine (PROZAC) 10 MG capsule Take 10 mg by mouth daily.    05/28/11  [provider]  glipiZIDE  (GLUCOTROL ) 10 MG tablet Take 10 mg by mouth 2 (two) times daily before a meal.    05/28/11  [provider]    PRN MEDs: acetaminophen  **OR** acetaminophen , bisacodyl , hydrALAZINE , HYDROmorphone  (DILAUDID ) injection, ipratropium, levalbuterol , ondansetron  **OR** ondansetron  (ZOFRAN ) IV, oxyCODONE , senna-docusate, sodium phosphate , traZODone   Past Medical History:  Diagnosis Date   Acid reflux    Amputated left leg (HCC)     bilateral BKA   Anemia    Arthritis    Axillary masses    Soft tissue - status post excision   Back pain    CHF (congestive heart failure) (HCC)    COVID-19 virus infection 04/06/2019   Depression    End-stage renal disease (HCC)    M/W/F dialysis   Essential hypertension    Headache    years ago   History of blood transfusion    History of cardiac catheterization    Normal coronary arteries October 2020   History of claustrophobia    History of pneumonia 2019   Hypoxia 04/03/2019   Memory loss    Mixed hyperlipidemia    Obesity    Pancreatitis    Peritoneal dialysis catheter in place [facility])    Pneumonia due to COVID-19 virus 04/02/2019   Sleep apnea  Noncompliant with CPAP   Stroke (HCC)    mini stroke   Type 2 diabetes mellitus (HCC)     Past Surgical History:  Procedure Laterality Date   ABDOMINAL AORTOGRAM W/LOWER EXTREMITY N/A 04/30/2022   Procedure: ABDOMINAL AORTOGRAM W/LOWER EXTREMITY;  Surgeon: Magda Debby SAILOR, MD;  [facility];  Service: Cardiovascular;  Laterality: N/A;   ABDOMINAL AORTOGRAM W/LOWER EXTREMITY N/A 07/21/2022   Procedure: ABDOMINAL AORTOGRAM W/LOWER EXTREMITY;  Surgeon: Serene Gaile ORN, MD;  Location: [facility];  Service: Cardiovascular;  Laterality: N/A;   ABDOMINAL HYSTERECTOMY     ACHILLES TENDON LENGTHENING  08/15/2022   Procedure: ACHILLES TENDON LENGTHENING;  Surgeon: Silva Juliene SAUNDERS, DPM;  Location: [facility] OR;  Service: Podiatry;;   AMPUTATION Right 05/29/2022   Procedure: RIGHT BELOW THE KNEE AMPUTATION;  Surgeon: Harden Jerona GAILS, MD;  Location: [facility] OR;  Service: Orthopedics;  Laterality: Right;   AMPUTATION Left 09/04/2022   Procedure: AMPUTATION FOOT, serial irrigation;  Surgeon: Joya Stabs, DPM;  Location: [facility] OR;  Service: Podiatry;  Laterality: Left;  Surgical team to do block   AMPUTATION Left 10/07/2022   Procedure: LEFT BELOW KNEE AMPUTATION;  Surgeon: Harden Jerona GAILS, MD;  Location: [facility] OR;  Service:  Orthopedics;  Laterality: Left;   AMPUTATION FINGER Left 09/29/2023   Procedure: AMPUTATION, FINGER;  Surgeon: Harden Jerona GAILS, MD;  Location: [facility] OR;  Service: Orthopedics;  Laterality: Left;  LEFT HAND LONG FINGER RAY AMPUTATION   AV FISTULA PLACEMENT Left 09/02/2017   Procedure: creation of left arm ARTERIOVENOUS (AV) FISTULA;  Surgeon: Serene Gaile ORN, MD;  Location: [facility] OR;  Service: Vascular;  Laterality: Left;   COLONOSCOPY  2008   Dr. Harvey: normal    COLONOSCOPY N/A 12/18/2016   Dr. Harvey: multiple tubular adenomas, internal hemorrhoids. Surveillance in 3 years    ESOPHAGEAL DILATION N/A 10/13/2015   Procedure: ESOPHAGEAL DILATION;  Surgeon: Claudis RAYMOND Rivet, MD;  [facility];  Service: Endoscopy;  Laterality: N/A;   ESOPHAGOGASTRODUODENOSCOPY N/A 10/13/2015   Dr. Rivet: chronic gastritis on path, no H.pylori. Empiric dilation    ESOPHAGOGASTRODUODENOSCOPY N/A 12/18/2016   Dr. Harvey: mild gastritis. BRAVO study revealed uncontrolled GERD. Dysphagia secondary to uncontrolled reflux   FOOT SURGERY Bilateral    nerve     INCISION AND DRAINAGE OF WOUND Left 10/30/2023   Procedure: IRRIGATION AND DEBRIDEMENT WOUND;  Surgeon: Arlinda Buster, MD;  Location: [facility] OR;  Service: Orthopedics;  Laterality: Left;  LEFT HAND WOUND   LEFT HEART CATH AND CORONARY ANGIOGRAPHY N/A 12/29/2018   Procedure: LEFT HEART CATH AND CORONARY ANGIOGRAPHY;  Surgeon: Dann Candyce RAMAN, MD;  Location: [facility] INVASIVE CV LAB;  Service: Cardiovascular;  Laterality: N/A;   LOWER EXTREMITY ANGIOGRAPHY Right 05/04/2022   Procedure: Lower Extremity Angiography;  Surgeon: Lanis Fonda BRAVO, MD;  Location: [facility] INVASIVE CV LAB;  Service: Cardiovascular;  Laterality: Right;   LUNG BIOPSY     MASS EXCISION Right 01/09/2013   Procedure: EXCISION OF NEOPLASM OF RIGHT  AXILLA  AND EXCISION OF NEOPLASM OF LEFT AXILLA;  Surgeon: Oneil DELENA Budge, MD;  Location: [facility] ORS;  Service: General;  Laterality: Right;  procedure end @ 08:23    MYRINGOTOMY WITH TUBE PLACEMENT Bilateral 04/28/2017   Procedure: BILATERAL MYRINGOTOMY WITH TUBE PLACEMENT;  Surgeon: Karis Clunes, MD;  Location: [facility] OR;  Service: ENT;  Laterality: Bilateral;   PERIPHERAL VASCULAR BALLOON ANGIOPLASTY Right 05/04/2022   Procedure: PERIPHERAL VASCULAR BALLOON ANGIOPLASTY;  Surgeon: Lanis Fonda BRAVO,  MD;  [facility];  Service: Cardiovascular;  Laterality: Right;  PT   PERIPHERAL VASCULAR INTERVENTION Right 05/04/2022   Procedure: PERIPHERAL VASCULAR INTERVENTION;  Surgeon: Lanis Fonda BRAVO, MD;  Location: [facility] INVASIVE CV LAB;  Service: Cardiovascular;  Laterality: Right;  SFA   PERIPHERAL VASCULAR INTERVENTION Left 07/21/2022   Procedure: PERIPHERAL VASCULAR INTERVENTION;  Surgeon: Serene Gaile ORN, MD;  [facility];  Service: Cardiovascular;  Laterality: Left;   REVISION OF ARTERIOVENOUS GORETEX GRAFT Left 05/04/2018   Procedure: TRANSPOSITION OF CEPHALIC VEIN ARTERIOVENOUS FISTULA LEFT ARM;  Surgeon: Oris Krystal FALCON, MD;  Location: [facility] OR;  Service: Vascular;  Laterality: Left;   SAVORY DILATION N/A 12/18/2016   Procedure: SAVORY DILATION;  Surgeon: Harvey Margo CROME, MD;  [facility];  Service: Endoscopy;  Laterality: N/A;   TRANSMETATARSAL AMPUTATION Left 08/15/2022   Procedure: TRANSMETATARSAL AMPUTATION;  Surgeon: Silva Juliene SAUNDERS, DPM;  Location: [facility] OR;  Service: Podiatry;  Laterality: Left;     reports that she has never smoked. She has never been exposed to tobacco smoke. She has never used smokeless tobacco. She reports that she does not drink alcohol and does not use drugs.   Family History  Problem Relation Age of Onset   Hypertension Father    Hypercholesterolemia Father    Arthritis Father    Hypertension Sister    Hypercholesterolemia Sister    Breast cancer Sister    Hypertension Sister    Colon cancer Neg Hx    Colon polyps Neg Hx     Physical Exam:   Vitals:   11/10/23 1000 11/10/23 1015 11/10/23  1226 11/10/23 1230  BP: (!) 159/67 (!) 162/68  (!) 175/67  Pulse: 71 71  67  Resp: 19   13  Temp:   98.5 F (36.9 C)   TempSrc:   Oral   SpO2: 100% 100%  100%   Constitutional: NAD, calm, comfortable Eyes: PERRL, lids and conjunctivae normal ENMT: Mucous membranes are moist. Posterior pharynx clear of any exudate or lesions.Normal dentition.  Neck: normal, supple, no masses, no thyromegaly Respiratory: clear to auscultation bilaterally, no wheezing, no crackles. Normal respiratory effort. No accessory muscle use.  Cardiovascular: Regular rate and rhythm, no murmurs / rubs / gallops. No extremity edema. 2+ pedal pulses. No carotid bruits.  Abdomen: no tenderness, no masses palpated. No hepatosplenomegaly. Bowel sounds positive.  Musculoskeletal: Bilateral BKA, stumps clear, no signs of active no clubbing / cyanosis. No joint deformity upper and lower extremities. Good ROM, no contractures. Normal muscle tone.  Neurologic: CN II-XII grossly intact. Sensation intact, DTR normal. Strength 5/5 in all 4.  Psychiatric: Normal judgment and insight. Alert and oriented x 3. Normal mood.  Skin: no rashes, lesions, ulcers. No induration Wounds: Left hand wound, dressing in place Please see nurses documentation for details          Labs on admission:    I have personally reviewed following labs and imaging studies  CBC: Recent Labs  Lab 11/04/23 0436 11/05/23 0508 11/10/23 0955  WBC 17.5* 16.7* 17.6*  HGB 11.0* 10.2* 10.3*  HCT 34.4* 31.8* 33.1*  MCV 75.1* 74.8* 79.0*  PLT 433* 401* 381   Basic Metabolic Panel: Recent Labs  Lab 11/04/23 0436 11/05/23 0508 11/10/23 0955  NA 138 140 137  K 4.4 4.5 3.5  CL 98 99 97*  CO2 26 28 27   GLUCOSE 148* 115* 73  BUN 38* 56* 21  CREATININE 3.89* 5.18* 2.81*  CALCIUM  9.0 8.7* 8.2*  MG 1.9 2.0 1.9  PHOS 4.2  --  1.7*   GFR: Estimated Creatinine Clearance: 19.9 mL/min (A) (by C-G formula based on SCr of 2.81 mg/dL (H)). Liver  Function Tests: Recent Labs  Lab 11/10/23 0955  AST 19  ALT <5  ALKPHOS 102  BILITOT 0.4  PROT 6.2*  ALBUMIN  2.4*   No results for input(s): LIPASE, AMYLASE in the last 168 hours. Recent Labs  Lab 11/10/23 0955  AMMONIA 17    CBG: Recent Labs  Lab 11/05/23 0823 11/05/23 1234 11/10/23 0933 11/10/23 1046 11/10/23 1136  GLUCAP 225* 323* 63* 68* 115*    Urine analysis:    Component Value Date/Time   COLORURINE AMBER (A) 11/10/2023 1101   APPEARANCEUR TURBID (A) 11/10/2023 1101   LABSPEC 1.017 11/10/2023 1101   PHURINE 5.0 11/10/2023 1101   GLUCOSEU NEGATIVE 11/10/2023 1101   HGBUR LARGE (A) 11/10/2023 1101   BILIRUBINUR NEGATIVE 11/10/2023 1101   BILIRUBINUR neg 02/15/2017 0913   KETONESUR NEGATIVE 11/10/2023 1101   PROTEINUR 100 (A) 11/10/2023 1101   UROBILINOGEN 0.2 02/15/2017 0913   NITRITE NEGATIVE 11/10/2023 1101   LEUKOCYTESUR MODERATE (A) 11/10/2023 1101    Last A1C:  Lab Results  Component Value Date   HGBA1C 6.7 (H) 10/27/2023     Radiologic Exams on Admission:   CT Head Wo Contrast Result Date: 11/10/2023 EXAM: CT HEAD WITHOUT CONTRAST 11/10/2023 10:52:18 AM TECHNIQUE: CT of the head was performed without the administration of intravenous contrast. Automated exposure control, iterative reconstruction, and/or weight based adjustment of the mA/kV was utilized to reduce the radiation dose to as low as reasonably achievable. COMPARISON: MRI of the head dated 12/17/2022. CLINICAL HISTORY: Mental status change, unknown cause. Pt arrived via [facility] from dialysis, from home, pt was A\T\O this morning 0645 am before Tx and become lethargic and zoned out around 0800 this morning and was slow to respond, pt states she feels a little sleepy but able to answer EMSs questions. FINDINGS: BRAIN AND VENTRICLES: No acute hemorrhage. Gray-white differentiation is preserved. No hydrocephalus. No extra-axial collection. No mass effect or midline shift. Chronic  encephalomalacia changes present within the left parietal and occipital lobes. Mild periventricular white matter disease. ORBITS: No acute abnormality. SINUSES: No acute abnormality. SOFT TISSUES AND SKULL: No acute soft tissue abnormality. No skull fracture. Calcifications within the carotid siphons. IMPRESSION: 1. No acute intracranial abnormality. 2. Chronic encephalomalacia changes in the left parietal and occipital lobes. 3. Mild periventricular white matter disease. 4. Calcifications within the carotid siphons. Electronically signed by: Evalene Coho MD 11/10/2023 10:58 AM EDT RP Workstation: [id number]   DG Chest Portable 1 View Result Date: 11/10/2023 CLINICAL DATA:  Confusion.  Lethargy. EXAM: PORTABLE CHEST 1 VIEW COMPARISON:  10/27/2023. FINDINGS: Stable cardiomegaly. Aortic atherosclerosis. Similar streaky left basilar opacities, favored to reflect atelectasis. The right lung is clear. No sizable pleural effusion or pneumothorax. Degenerative changes of the right glenohumeral joint. No acute osseous abnormality. IMPRESSION: 1. Similar streaky left basilar opacities, favored to reflect atelectasis. 2. Stable cardiomegaly. Electronically Signed   By: Harrietta Sherry M.D.   On: 11/10/2023 10:10    EKG:   Independently reviewed.  Orders placed or performed during the hospital encounter of 11/10/23   ED EKG   ED EKG   EKG 12-Lead   EKG 12-Lead   EKG 12-Lead   *Note: Due to a large number of results and/or encounters for the requested time period, some results have not been displayed.  A complete set of results can be found in Results Review.   ---------------------------------------------------------------------------------------------------------------------------------------    Assessment / Plan:   Principal Problem:   SIRS (systemic inflammatory response syndrome) (HCC) Active Problems:   Afib (HCC)   Abscess of left hand   Chronic diastolic (congestive) heart failure  (HCC)   ESRD on hemodialysis (HCC)   CVA (cerebral vascular accident) (HCC)   Depression, major, single episode, moderate (HCC)   Hyperlipidemia   HTN (hypertension)   Chronic pain syndrome   Anemia of chronic disease   Diabetes mellitus (HCC)   Morbid obesity (HCC)   S/P BKA (below knee amputation), right (HCC)   Amputated left leg (HCC)   Acute confusion   GAD (generalized anxiety disorder)   Uncontrolled type 2 diabetes mellitus with hyperglycemia (HCC)   Assessment and Plan: * SIRS (systemic inflammatory response syndrome) (HCC) POA: Patient met SIRS criteria due to altered mental status, leukocytosis 17.6, -Ruling out sepsis -UA positive for hgb, leukocytes, many bacteria, WBC > 500 -Source of infection urinary tract infection  - Patient received IV Rocephin  will be continued -Gentle IV fluid resuscitation (avoiding volume overload as patient is on hemodialysis) - Will follow-up with urine/blood cultures, -Obtaining lactic acid  Abscess of left hand - Stable, continue antibiotics - Home antibiotics Ancef  with hemodialysis TTS -Continue Rocephin  for now  Afib (HCC) - Heart rate controlled, continue Eliquis , metoprolol   Depression, major, single episode, moderate (HCC) - Review home medication, resume Zoloft , BuSpar , holding Cymbalta   CVA (cerebral vascular accident) (HCC) - Continue statins, continue Eliquis  -Neurocheck for confusion  ESRD on hemodialysis (HCC) - On hemodialysis MWF -Partial hemodialysis today -Will notify and consult nephrology for continued dialysis  Chronic diastolic (congestive) heart failure (HCC) - Euvolemic, monitoring volume status: Dialysis Currently stable - Resuming home Demadex  in a.m.  Last echo 06/15/2023, EF 55%.  No LVH,. Left ventricular diastolic parameters are consistent with Grade I diastolic dysfunction (impaired relaxation). Elevated left ventricular end-diastolic pressure.   Chronic pain syndrome - Minimizing narcotics  due to altered mental status  HTN (hypertension) - Blood pressure stable, reviewing home medication, continue Norvasc , metoprolol ,  Hyperlipidemia Continue statins  GAD (generalized anxiety disorder) - As needed benzodiazepines, will monitor due to confusion  Acute confusion - Likely due to hypoglycemia, exacerbated by infection -Monitoring closely -Continue neurochecks Avoiding contributing sedative meds  Amputated left leg (HCC) - Status post bilateral BKA, monitoring closely No signs of infection  S/P BKA (below knee amputation), right (HCC) - No signs of infection at stump  Morbid obesity (HCC)  Height 5 5, weight 76.7 kg 7 days ago Will calculate BMI, Patient will benefit from outpatient follow-up with PCP for weight management  Diabetes mellitus (HCC) - Last A1c 6.7 -Hypoglycemic this a.m. CBG (last 3)  Recent Labs    11/10/23 0933 11/10/23 1046 11/10/23 1136  GLUCAP 63* 68* 115*   - Holding home medication of insulin  glargine for now - Checking CBG q. ACHS, SSI coverage   Anemia of chronic disease - Monitoring H&H closely              Consults called: Nephrologist -------------------------------------------------------------------------------------------------------------------------------------------- DVT prophylaxis:  SCDs Start: 11/10/23 1243 apixaban  (ELIQUIS ) tablet 5 mg   Code Status:   Code Status: Full Code   Admission status: Patient will be admitted as Observation, with a greater than 2 midnight length of stay. Level of care: Telemetry   Family Communication:  none at bedside  (The above findings and plan of care has been  discussed with patient in detail, the patient expressed understanding and agreement of above plan)  --------------------------------------------------------------------------------------------------------------------------------------------------  Disposition Plan:  Anticipated 1-2 days Status is:  Observation The patient remains OBS appropriate and will d/c before 2 midnights.     ----------------------------------------------------------------------------------------------------------------------------------------------------  Time spent:  72  Min.  Was spent seeing and evaluating the patient, reviewing all medical records, drawn plan of care.  SIGNED: Adriana DELENA Grams, MD, FHM. FAAFP. [place] [facility], Pager  (Please use [email or web address] to page/ or secure chat through epic) If 7PM-7AM, please contact night-coverage [email or web address],  11/10/2023, 1:26 PM

## 2023-11-10 NOTE — TOC CM/SW Note (Signed)
 Transition of Care [facility]) - Inpatient Brief Assessment   Patient Details  Name: Kathryn Beck MRN: [id number] Date of Birth: 14-Jun-1955  [facility] of [facility]) CM/SW Contact:    Lucie Lunger, LCSWA Phone Number: 11/10/2023, 3:04 PM   Clinical Narrative: CSW notes pt had recent admission to hospital, at time of D/C pt was set up with [facility] RN/OT/Aide through [facility]. CSW reached out to [name] with [facility] to confirm that they are still open, pt will need new orders prior to hospital D/C. TOC to follow.   Transition of Care Asessment: Insurance and Status: Insurance coverage has been reviewed Patient has primary care physician: Yes Home environment has been reviewed: From home Prior level of function:: Needs assistance Prior/Current Home Services: Current home services [facility] RN/OT/Aide) Social Drivers of Health Review: SDOH reviewed no interventions necessary Readmission risk has been reviewed: Yes Transition of care needs: transition of care needs identified, TOC will continue to follow

## 2023-11-10 NOTE — Assessment & Plan Note (Addendum)
-   Euvolemic, monitoring volume status: Dialysis Currently stable - Resuming home Demadex  in a.m.  Last echo 06/15/2023, EF 55%.  No LVH,. Left ventricular diastolic parameters are consistent with Grade I diastolic dysfunction (impaired relaxation). Elevated left ventricular end-diastolic pressure.

## 2023-11-10 NOTE — ED Provider Notes (Signed)
 [place] [facility]MENT AT [facility] Provider Note   CSN: [id number] Arrival date & time: 11/10/23  9089     Patient presents with: Fatigue   Kathryn Beck is a 68 y.o. female with a history including hypertension, hyperlipidemia, history of CVA, CHF, type 2 diabetes, bilateral BKA, currently being treated for suspected MSSA left hand infection, 5 days out from hospitalization where she underwent I&D for this and is currently receiving IV cefazolin  with dialysis, presenting from dialysis today secondary to lethargy and drowsiness which was first noticed around 8 AM this morning, although when she first arrived for her dialysis at 6:45 AM she was her normal self.  She has no complaints of pain or other symptoms, she states she did not sleep well last night.  She is alert and oriented x 4 but is slow to respond to questions.  She does have some confusion as she told me she arrived from home this morning rather than dialysis, and thought her last dialysis treatment happened yesterday.     The history is provided by the patient.       Prior to Admission medications   Medication Sig Start Date End Date Taking? Authorizing Provider  acetaminophen  (TYLENOL ) 650 MG CR tablet Take 1,300 mg by mouth every 8 (eight) hours as needed for pain.    [provider]  amLODipine  (NORVASC ) 10 MG tablet Take 0.5 tablets (5 mg total) by mouth daily. 10/28/23   Pearlean Manus, MD  apixaban  (ELIQUIS ) 5 MG TABS tablet Take 1 tablet (5 mg total) by mouth 2 (two) times daily. For Afib 10/29/23   Pearlean Manus, MD  aspirin  81 MG chewable tablet Chew 2 tablets (162 mg total) by mouth daily. Patient taking differently: Chew 81 mg by mouth 2 (two) times daily. 09/16/23   Bevely Doffing, FNP  B Complex-C-Folic Acid  (RENA-VITE RX) 1 MG TABS Take 1 tablet by mouth daily. 04/15/22   [provider]  busPIRone  (BUSPAR ) 5 MG tablet TAKE ONE TABLET BY MOUTH TWICE DAILY 10/20/23    Antonetta Rollene BRAVO, MD  calcitRIOL  (ROCALTROL ) 0.5 MCG capsule Take 2 capsules (1 mcg total) by mouth every Monday, Wednesday, and Friday with hemodialysis. 11/05/23   Caleen Burgess BROCKS, MD  ceFAZolin  (ANCEF ) 2-4 GM/100ML-% IVPB Inject 100 mLs (2 g total) into the vein every Monday, Wednesday, and Friday with hemodialysis for 20 days. 11/05/23 11/25/23  Amin, Ankit C, MD  cinacalcet  (SENSIPAR ) 30 MG tablet Take 3 tablets (90 mg total) by mouth 3 (three) times a week. Patient taking differently: Take 90 mg by mouth every Monday, Wednesday, and Friday. 09/09/22   Sherrill Cable Latif, DO  Darbepoetin Alfa  (ARANESP ) 40 MCG/0.4ML SOSY injection Inject 0.4 mLs (40 mcg total) into the skin every Friday at 6 PM. 11/05/23   Amin, Ankit C, MD  DULoxetine  (CYMBALTA ) 60 MG capsule TAKE (1) CAPSULE BY MOUTH ONCE DAILY. 04/05/23   Antonetta Rollene BRAVO, MD  ezetimibe  (ZETIA ) 10 MG tablet Take 1 tablet (10 mg total) by mouth daily. 04/27/23   Antonetta Rollene BRAVO, MD  insulin  glargine, 2 Unit Dial , (TOUJEO  MAX SOLOSTAR) 300 UNIT/ML Solostar Pen Inject 30 Units into the skin daily. May need to slowly increase the dose depending upon your blood sugar, follow-up with PCP 01/01/23   Trixie File, MD  metoprolol  succinate (TOPROL -XL) 50 MG 24 hr tablet TAKE (1) TABLET BY MOUTH DAILY WITH FOOD *TAKE AFTER DIALYSIS* Patient taking differently: Take 50 mg by mouth Every Tuesday,Thursday,and Saturday  with dialysis. Take 50mg  (1 tablets) by mouth on non-dialysis days. (Sunday, Tuesday, Thursday, Saturday). 01/05/23   Antonetta Rollene BRAVO, MD  oxyCODONE -acetaminophen  (PERCOCET/ROXICET) 5-325 MG tablet Take 1 tablet by mouth every 8 (eight) hours as needed. 11/09/23   Valdemar Rocky SAUNDERS, NP  sertraline  (ZOLOFT ) 50 MG tablet TAKE ONE TABLET BY MOUTH ONCE DAILY 11/09/23   Antonetta Rollene BRAVO, MD  torsemide  (DEMADEX ) 100 MG tablet Take 1 tablet (100 mg total) by mouth every Tuesday, Thursday, Saturday, and Sunday. 10/28/23   Pearlean Manus, MD   [name]  500 MG chewable tablet Chew 1-2 tablets (500-1,000 mg total) by mouth See admin instructions. Take 1000mg  (2 tablets) by mouth with meals and 500mg  (1 tablet) with snacks 08/23/23   Antonetta Rollene BRAVO, MD  FLUoxetine (PROZAC) 10 MG capsule Take 10 mg by mouth daily.    05/28/11  [provider]  glipiZIDE  (GLUCOTROL ) 10 MG tablet Take 10 mg by mouth 2 (two) times daily before a meal.    05/28/11  [provider]    Allergies: Ace inhibitors, Penicillins, Statins, and Albuterol     Review of Systems  Unable to perform ROS: Mental status change    Updated Vital Signs BP (!) 162/68   Pulse 71   Temp 98.5 F (36.9 C) (Oral)   Resp 19   SpO2 100%   Physical Exam Vitals and nursing note reviewed.  Constitutional:      Appearance: She is well-developed.  HENT:     Head: Normocephalic and atraumatic.     Mouth/Throat:     Mouth: Mucous membranes are moist.  Eyes:     Conjunctiva/sclera: Conjunctivae normal.  Cardiovascular:     Rate and Rhythm: Normal rate and regular rhythm.     Heart sounds: Normal heart sounds.  Pulmonary:     Effort: Pulmonary effort is normal.     Breath sounds: Normal breath sounds. No wheezing.  Abdominal:     General: Bowel sounds are normal.     Palpations: Abdomen is soft.     Tenderness: There is no abdominal tenderness. There is no guarding.  Musculoskeletal:        General: Normal range of motion.     Cervical back: Normal range of motion.     Comments: Bilateral bka,  dialysis site accessed but with clamp in place left upper arm.  Left hand in dressing and splint.  No erythema or edema outside of the dressing.  Skin:    General: Skin is warm and dry.  Neurological:     General: No focal deficit present.     Mental Status: She is alert. She is disoriented.     Cranial Nerves: No cranial nerve deficit.     Sensory: No sensory deficit.     Comments: Moves all 4 extremities without weakness or neglect.      (all labs  ordered are listed, but only abnormal results are displayed) Labs Reviewed  COMPREHENSIVE METABOLIC PANEL WITH GFR - Abnormal; Notable for the following components:      Result Value   Chloride 97 (*)    Creatinine, Ser 2.81 (*)    Calcium  8.2 (*)    Total Protein 6.2 (*)    Albumin  2.4 (*)    GFR, Estimated 18 (*)    All other components within normal limits  CBC - Abnormal; Notable for the following components:   WBC 17.6 (*)    Hemoglobin 10.3 (*)    HCT 33.1 (*)  MCV 79.0 (*)    MCH 24.6 (*)    RDW 19.8 (*)    nRBC 0.8 (*)    All other components within normal limits  URINALYSIS, ROUTINE W REFLEX MICROSCOPIC - Abnormal; Notable for the following components:   Color, Urine AMBER (*)    APPearance TURBID (*)    Hgb urine dipstick LARGE (*)    Protein, ur 100 (*)    Leukocytes,Ua MODERATE (*)    Bacteria, UA MANY (*)    Non Squamous Epithelial 0-5 (*)    All other components within normal limits  CBG MONITORING, ED - Abnormal; Notable for the following components:   Glucose-Capillary 63 (*)    All other components within normal limits  CBG MONITORING, ED - Abnormal; Notable for the following components:   Glucose-Capillary 68 (*)    All other components within normal limits  CBG MONITORING, ED - Abnormal; Notable for the following components:   Glucose-Capillary 115 (*)    All other components within normal limits  URINE CULTURE  AMMONIA    EKG: EKG Interpretation Date/Time:  Wednesday November 10 2023 10:02:19 EDT Ventricular Rate:  72 PR Interval:  151 QRS Duration:  95 QT Interval:  421 QTC Calculation: 461 R Axis:   104  Text Interpretation: Sinus rhythm Multiple premature complexes, vent & supraven Right axis deviation Borderline repolarization abnormality HR is slower, otherwise ECG is similar to Oct 27 2023 Confirmed by Freddi Hamilton [id number] on 11/10/2023 10:03:55 AM  Radiology: CT Head Wo Contrast Result Date: 11/10/2023 EXAM: CT HEAD WITHOUT CONTRAST  11/10/2023 10:52:18 AM TECHNIQUE: CT of the head was performed without the administration of intravenous contrast. Automated exposure control, iterative reconstruction, and/or weight based adjustment of the mA/kV was utilized to reduce the radiation dose to as low as reasonably achievable. COMPARISON: MRI of the head dated 12/17/2022. CLINICAL HISTORY: Mental status change, unknown cause. Pt arrived via [facility] from dialysis, from home, pt was A\T\O this morning 0645 am before Tx and become lethargic and zoned out around 0800 this morning and was slow to respond, pt states she feels a little sleepy but able to answer EMSs questions. FINDINGS: BRAIN AND VENTRICLES: No acute hemorrhage. Gray-white differentiation is preserved. No hydrocephalus. No extra-axial collection. No mass effect or midline shift. Chronic encephalomalacia changes present within the left parietal and occipital lobes. Mild periventricular white matter disease. ORBITS: No acute abnormality. SINUSES: No acute abnormality. SOFT TISSUES AND SKULL: No acute soft tissue abnormality. No skull fracture. Calcifications within the carotid siphons. IMPRESSION: 1. No acute intracranial abnormality. 2. Chronic encephalomalacia changes in the left parietal and occipital lobes. 3. Mild periventricular white matter disease. 4. Calcifications within the carotid siphons. Electronically signed by: Evalene Coho MD 11/10/2023 10:58 AM EDT RP Workstation: [id number]   DG Chest Portable 1 View Result Date: 11/10/2023 CLINICAL DATA:  Confusion.  Lethargy. EXAM: PORTABLE CHEST 1 VIEW COMPARISON:  10/27/2023. FINDINGS: Stable cardiomegaly. Aortic atherosclerosis. Similar streaky left basilar opacities, favored to reflect atelectasis. The right lung is clear. No sizable pleural effusion or pneumothorax. Degenerative changes of the right glenohumeral joint. No acute osseous abnormality. IMPRESSION: 1. Similar streaky left basilar opacities, favored to reflect  atelectasis. 2. Stable cardiomegaly. Electronically Signed   By: Harrietta Sherry M.D.   On: 11/10/2023 10:10     Procedures   Medications Ordered in the ED  cefTRIAXone  (ROCEPHIN ) 1 g in sodium chloride  0.9 % 100 mL IVPB (has no administration in time range)  Medical Decision Making Patient presenting from her dialysis center mid treatment secondary to altered mental status, described as confusion and slow to respond to questions and staring, less conversive than normal although she was alert and normal when she first started her dialysis treatment.  Currently on cefazolin  with dialysis treatments for treatment of a left hand abscess for which she was admitted to the hospital, went to the OR for I&D, MSSA.  Differential diagnosis including electrolyte disturbance, worsening infection, dehydration, CVA although on exam she has no focal deficits.  Per report and since arrival here she has had no episodes of unresponsiveness, she has no history of seizure disorder.  She is slow to respond to questions but is awake and oriented x 3, however was confused with some questions such as patient thought she came in from home rather than dialysis and states her last dialysis was yesterday not today.  Upon first arrival her CBG was low at 28, we gave patient juice and a snack, at recheck it was 68, we gave her a full meal after which her blood glucose was improved at 115.  Labs and imaging obtained per below, she does have a UTI, patient states she makes a small amount of urine and not every day, this was an In-N-Out cath specimen.  Patient does not meet SIRS criteria.  A urine culture has been ordered.  Amount and/or Complexity of Data Reviewed Labs: ordered.    Details: Labs significant for greater than 50 RBCs and WBCs in urine with many bacteria, this was a In-N-Out cath specimen.  Her CMET is relatively stable, she does have a creatinine of 2.81, her CBC significant for  leukocytosis at 17.6 which has been relatively stable since she was admitted for her hand infection.  Negative ammonia.  Hemoglobin is 10.3 which is chronic and stable. Radiology: ordered.    Details: Atelectasis on chest x-ray, unchanged from prior.  Stable cardiomegaly.  CT head negative for hemorrhagic CVA. ECG/medicine tests: ordered.    Details: EKG reviewed, rate 72, sinus rhythm with PVCs.  Right axis deviation, no significant changes. Discussion of management or test interpretation with external provider(s): Patient discussed with Garnette Gower.D. regarding antibiotic choice given patient is currently on 3 times weekly cefazolin  for her hand infection, now with symptomatic UTI.  Rocephin  is the best option at this point which has been ordered.  Patient may need ID pharmacology consult during admission which is appropriate given patient's altered mental status.  Urine culture has been ordered.  Discussed pt with Dr. Willette who will admit pt.  Asked for nephrology consult - will advise of pt's admission plan.   Dr. Alica aware pt being admitted, had received partial dialysis prior to arrival today.     Risk Decision regarding hospitalization.        Final diagnoses:  Acute cystitis with hematuria  Disorientation  Hypoglycemia    ED Discharge Orders     None          Birdena Mliss RIGGERS 11/10/23 1253    Freddi Hamilton, MD 11/11/23 [phone]

## 2023-11-10 NOTE — Assessment & Plan Note (Deleted)
-   Review home medication, resume Zoloft , BuSpar , holding Cymbalta

## 2023-11-10 NOTE — Assessment & Plan Note (Signed)
-   Likely due to hypoglycemia, exacerbated by infection -Monitoring closely -Continue neurochecks Avoiding contributing sedative meds

## 2023-11-10 NOTE — Assessment & Plan Note (Signed)
-   Status post bilateral BKA, monitoring closely No signs of infection

## 2023-11-10 NOTE — Plan of Care (Signed)
  Problem: Acute Rehab PT Goals(only PT should resolve) Goal: Pt will Roll Supine to Side Outcome: Progressing Flowsheets (Taken 11/10/2023 1623) Pt will Roll Supine to Side: Independently Goal: Pt Will Go Supine/Side To Sit Outcome: Progressing Flowsheets (Taken 11/10/2023 1623) Pt will go Supine/Side to Sit: with contact guard assist Goal: Patient Will Perform Sitting Balance Outcome: Progressing Flowsheets (Taken 11/10/2023 1623) Patient will perform sitting balance: Independently   4:23 PM, 11/10/23 Rosaria Settler, PT, DPT [place] with [facility]

## 2023-11-10 NOTE — Assessment & Plan Note (Signed)
 Continue statins

## 2023-11-10 NOTE — Assessment & Plan Note (Signed)
-   Review home medication, resume Zoloft , BuSpar , holding Cymbalta

## 2023-11-10 NOTE — Assessment & Plan Note (Signed)
-   No signs of infection at stump

## 2023-11-10 NOTE — Evaluation (Signed)
 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 Presented from hemodialysis today for drowsiness, confusion.  Upon applying the patient was found redly, lethargic.[name]  8 AM, today's dialysis was started at 6:45, was cut short due to her change in status.   Clinical Impression  Patient agreeable to PT evaluation. Patient reports weeks ago, she was able to perform slide board transfers independently prior to her L hand injury. Since L hand injury,  patient reports requiring max/total assist with bed mobility and transfers as she was told to not WB through L hand. Patient reports having 24/7 assist from spouse and son. Per home history, pt has all necessary equipment at home, including hoyer lift. On this date, patient can roll to R modified independently but requires mod assist for side-lying/supine to sit. Patient reports she does not have bilateral prosthesis yet. Patient seems to be near her most recent baseline. Patient returned to supine with call button within reach. Nursing staff aware. Patient will benefit from continued skilled physical therapy acutely and in recommended venue in order to address current deficits to improve overall function.        If plan is discharge home, recommend the following: Two people to help with walking and/or transfers;Two people to help with bathing/dressing/bathroom;Assistance with cooking/housework;Assistance with feeding;Assist for transportation;Help with stairs or ramp for entrance;Supervision due to cognitive status   Can travel by private vehicle        Equipment Recommendations None recommended by PT  Recommendations for Other  Services       Functional Status Assessment Patient has had a recent decline in their functional status and demonstrates the ability to make significant improvements in function in a reasonable and predictable amount of time.     Precautions / Restrictions Precautions Precautions: Fall Required Braces or Orthoses: Splint/Cast Splint/Cast: L hand Restrictions Weight Bearing Restrictions Per Provider Order: No      Mobility  Bed Mobility Overal bed mobility: Needs Assistance Bed Mobility: Sit to Supine, Rolling, Sidelying to Sit Rolling: Modified independent (Device/Increase time) (Pt able to roll to R with use of railings. no physical assist from PT) Sidelying to sit: Min assist, Mod assist, Used rails   Sit to supine: Contact guard assist, Min assist   General bed mobility comments: min/mod assist for elevating trunk and verbal cues for pushing through RLE for sidelying to sit. Pt avoids any WB through LUE throughout.    Transfers   General transfer comment: Pt non-ambulatory at baseline. Requires assist as of recent via hoyer or total assist from family. Previously independent with slide board transfer prior to hand injury.    Ambulation/Gait   General Gait Details: Pt non-ambulatory at baseline. Requires assist as of recent via hoyer or total assist from family. Previously independent with slide board transfer prior to hand injury.  Stairs            Wheelchair Mobility     Tilt Bed    Modified Rankin (Stroke Patients Only)       Balance Overall balance assessment: Needs assistance Sitting-balance support: Feet unsupported, Single extremity supported Sitting balance-Leahy Scale: Fair Sitting balance - Comments: Fair/good seated EOB with RUE support   Standing balance  support:  (N/A)           Pertinent Vitals/Pain Pain Assessment Pain Assessment: 0-10 Pain Score: 9  Pain Location: L hand Pain Descriptors / Indicators: Sore Pain Intervention(s):  Limited activity within patient's tolerance, Patient requesting pain meds-RN notified    Home Living Family/patient expects to be discharged to:: Private residence Living Arrangements: Spouse/significant other Available Help at Discharge: Family;Available PRN/intermittently Type of Home: House Home Access: Stairs to enter Entrance Stairs-Rails: None Entrance Stairs-Number of Steps: 2 Alternate Level Stairs-Number of Steps: Flight Home Layout: Two level;Able to live on main level with bedroom/bathroom Home Equipment: BSC/3in1;Wheelchair - Forensic psychologist (2 wheels);Cane - single point;Grab bars - tub/shower;Wheelchair - power;Hand held shower head;Other (comment) Lajean) Additional Comments: Pt confirms home history in system.    Prior Function Prior Level of Function : Needs assist       Physical Assist : Mobility (physical);ADLs (physical) Mobility (physical): Bed mobility;Transfers ADLs (physical): Bathing;Dressing;IADLs Mobility Comments: Pt confirms prior to recent blistering on L hand she was independent with slide board transfers. Since then, she's required assist with transfers. Reports she does not have prosthesis due to healing issues and non-ambulatory at baseline, w/c main mode of transport. ADLs Comments: Confirms assist from family for ADLs and iADLs.     Extremity/Trunk Assessment   Upper Extremity Assessment Upper Extremity Assessment: Defer to OT evaluation;LUE deficits/detail LUE Deficits / Details: L hand in splint/cast during session. Pt elevates hand as to not WB through it. Reports she was told not to. inc pain in L hand. LUE: Unable to fully assess due to pain;Unable to fully assess due to immobilization    Lower Extremity Assessment Lower Extremity Assessment: Generalized weakness;RLE deficits/detail;LLE deficits/detail RLE Deficits / Details: Bilat BKA, no prosthesis, at least 3+ to 4- MMT as pt in able to lift BLE against gravity when getting to  EOB. RLE Coordination: decreased gross motor LLE Deficits / Details: Bilat BKA, no prosthesis, at least 3+ to 4- MMT as pt in able to lift BLE against gravity when getting to EOB. LLE Coordination: decreased gross motor    Cervical / Trunk Assessment Cervical / Trunk Assessment: Kyphotic  Communication   Communication Communication: No apparent difficulties    Cognition Arousal: Alert (Alert but noticeable fatigued) Behavior During Therapy: WFL for tasks assessed/performed   PT - Cognitive impairments: No family/caregiver present to determine baseline       PT - Cognition Comments: Alert and oriented to self, place, and year. Disoriented when asked month. Following commands: Intact, Impaired Following commands impaired: Follows one step commands with increased time     Cueing Cueing Techniques: Verbal cues, Visual cues     General Comments      Exercises     Assessment/Plan    PT Assessment All further PT needs can be met in the next venue of care  PT Problem List Decreased strength;Decreased activity tolerance;Decreased balance;Decreased mobility;Pain;Decreased range of motion       PT Treatment Interventions DME instruction;Functional mobility training;Therapeutic activities;Therapeutic exercise;Balance training;Neuromuscular re-education;Patient/family education;Wheelchair mobility training    PT Goals (Current goals can be found in the Care Plan section)  Acute Rehab PT Goals Patient Stated Goal: return home PT Goal Formulation: With patient Time For Goal Achievement: 11/12/23 Potential to Achieve Goals: Good    Frequency Min 2X/week     Co-evaluation               AM-PAC PT 6 Clicks Mobility  Outcome Measure Help needed turning from  your back to your side while in a flat bed without using bedrails?: A Lot Help needed moving from lying on your back to sitting on the side of a flat bed without using bedrails?: A Lot Help needed moving to and from  a bed to a chair (including a wheelchair)?: Total Help needed standing up from a chair using your arms (e.g., wheelchair or bedside chair)?: Total Help needed to walk in hospital room?: Total Help needed climbing 3-5 steps with a railing? : Total 6 Click Score: 8    End of Session   Activity Tolerance: Patient limited by pain Patient left: in bed;with call bell/phone within reach Nurse Communication: Mobility status;Patient requests pain meds PT Visit Diagnosis: Other abnormalities of gait and mobility (R26.89);Muscle weakness (generalized) (M62.81);Pain Pain - Right/Left: Left Pain - part of body: Hand    Time: 1421-1435 PT Time Calculation (min) (ACUTE ONLY): 14 min   Charges:   PT Evaluation $PT Eval Low Complexity: 1 Low   PT General Charges $$ ACUTE PT VISIT: 1 Visit        4:21 PM, 11/10/23 Obrian Bulson Powell-Butler, PT, DPT [place] with [facility]

## 2023-11-10 NOTE — Assessment & Plan Note (Signed)
-   Continue statins, continue Eliquis  -Neurocheck for confusion

## 2023-11-10 NOTE — Assessment & Plan Note (Signed)
 POA: Patient met SIRS criteria due to altered mental status, leukocytosis 17.6, -Ruling out sepsis -UA positive for hgb, leukocytes, many bacteria, WBC > 500 -Source of infection urinary tract infection  - Patient received IV Rocephin  will be continued -Gentle IV fluid resuscitation (avoiding volume overload as patient is on hemodialysis) - Will follow-up with urine/blood cultures, -Obtaining lactic acid

## 2023-11-10 NOTE — Assessment & Plan Note (Signed)
  Height 5 5, weight 76.7 kg 7 days ago Will calculate BMI, Patient will benefit from outpatient follow-up with PCP for weight management

## 2023-11-10 NOTE — Consult Note (Signed)
 [facility]                                                                                        Patient Identification: Patient Name: Kathryn Beck MRN: [id number] Admit Date: 11/10/2023  9:13 AM Today's Date: 11/10/2023 Reason for consult: Left hand infection  Requesting provider: Dr Willette  Principal Problem:   SIRS (systemic inflammatory response syndrome) (HCC) Active Problems:   Hyperlipidemia   Anemia of chronic disease   Chronic diastolic (congestive) heart failure (HCC)   HTN (hypertension)   Diabetes mellitus (HCC)   Chronic pain syndrome   ESRD on hemodialysis (HCC)   Morbid obesity (HCC)   S/P BKA (below knee amputation), right (HCC)   Amputated left leg (HCC)   Acute confusion   CVA (cerebral vascular accident) (HCC)   Depression, major, single episode, moderate (HCC)   GAD (generalized anxiety disorder)   Uncontrolled type 2 diabetes mellitus with hyperglycemia (HCC)   Afib (HCC)   Abscess of left hand  I connected with the patient by a video enabled telemedicine application and verified that I am speaking with the correct person using two identifiers.  Location: Patient: Kathryn Beck  Provider: [facility]  Antibiotics:  Ceftriaxone  8/20  Lines/Hardware:  Assessment # Acute onset AMS, resolved - in the setting of hypoglycemia  - 8/20 CT head with no acute abnormality   # Left hand infection/abscess  - 8/9 s/p I and D. Cx MSSA - Discharged 8/15 to complete 2-3 weeks of IV cefazolin  with HD - RN at bedside reports some tenderness while unwrapping bandage and some serosanguinous drainage.   # Abnormal UA - makes little urine, denies GU symptoms - Unlikely UTI   # Leukocytosis  - was 16.7 at 8/15  Recommendations  - will switch back IV ceftriaxone  to IV cefazolin   - monitor CBC and BMP - fu pending cultures  - consult wound care for evaluation of left  hand. May need to consult surgery if any signs concerning of infection  - Universal/standard isolation precautions. D/w primary team.   Rest of the management as per the primary team. Please call with questions or concerns.  Thank you for the consult  __________________________________________________________________________________________________________ HPI and Hospital Course: 68 year old female with history of A-fib on AC, type 2 DM, PAD s/p bilateral BKA, left long finger ray amputation, AAS, CHF, ESRD on HD, HTN,, HLD, obesity/OSA, depression, CVA, bilateral adrenal adenoma, who presented to the ED from dialysis mid treatment for being lethargic, and slow to respond, staring and less conversive than normal.   Upon arrival CBC low at 63 and was given juice, snack with recheck at 68.  She was given a full meal after which blood glucose improved 115.   She was recently admitted 8/8-8/15 for left hand abscess and underwent I&D, getting cefazolin  with HD.  Denies any GU symptoms or dysuria. Reports appetite was OK. No rashes or wound on skin. Denies any concerns at AV access.   At ED afebrile Labs remarkable for albumin  2.4, WBC elevated to 17.6, hemoglobin 10.3  UA turbid, large hemoglobin, moderate leukocytes, negative nitrite, many bacteria  RBC more than 50, WBC more than 50  Blood cx 2/2 sets sent  CXR IMPRESSION: 1. Similar streaky left basilar opacities, favored to reflect atelectasis. 2. Stable cardiomegaly.  CT head 1. No acute intracranial abnormality. 2. Chronic encephalomalacia changes in the left parietal and occipital lobes. 3. Mild periventricular white matter disease. 4. Calcifications within the carotid siphons.  ROS: General- Denies fever, chills, loss of appetite and loss of weight HEENT - Denies headache, blurry vision, neck pain, sinus pain Chest - Denies any chest pain, SOB or cough CVS- Denies any dizziness/lightheadedness, syncopal attacks,  palpitations Abdomen- Denies any nausea, vomiting, abdominal pain, hematochezia and diarrhea Neuro - Denies any weakness, numbness, tingling sensation Psych - Denies any changes in mood irritability or depressive symptoms GU- Denies any burning, dysuria, hematuria or increased frequency of urination Skin - denies any rashes/lesions MSK - denies any joint pain/swelling or restricted ROM   Past Medical History:  Diagnosis Date   Acid reflux    Amputated left leg (HCC)    bilateral BKA   Anemia    Arthritis    Axillary masses    Soft tissue - status post excision   Back pain    CHF (congestive heart failure) (HCC)    COVID-19 virus infection 04/06/2019   Depression    End-stage renal disease (HCC)    M/W/F dialysis   Essential hypertension    Headache    years ago   History of blood transfusion    History of cardiac catheterization    Normal coronary arteries October 2020   History of claustrophobia    History of pneumonia 2019   Hypoxia 04/03/2019   Memory loss    Mixed hyperlipidemia    Obesity    Pancreatitis    Peritoneal dialysis catheter in place [facility])    Pneumonia due to COVID-19 virus 04/02/2019   Sleep apnea    Noncompliant with CPAP   Stroke (HCC)    mini stroke   Type 2 diabetes mellitus (HCC)     Past Surgical History:  Procedure Laterality Date   ABDOMINAL AORTOGRAM W/LOWER EXTREMITY N/A 04/30/2022   Procedure: ABDOMINAL AORTOGRAM W/LOWER EXTREMITY;  Surgeon: Magda Debby SAILOR, MD;  Location: [facility] INVAS[facility] LAB;  Service: Cardiovascular;  Laterality: N/A;   ABDOMINAL AORTOGRAM W/LOWER EXTREMITY N/A 07/21/2022   Procedure: ABDOMINAL AORTOGRAM W/LOWER EXTREMITY;  Surgeon: Serene Gaile ORN, MD;  Location: [facility];  Service: Cardiovascular;  Laterality: N/A;   ABDOMINAL HYSTERECTOMY     ACHILLES TENDON LENGTHENING  08/15/2022   Procedure: ACHILLES TENDON LENGTHENING;  Surgeon: Silva Juliene SAUNDERS, DPM;  Location: [facility] OR;  Service: Podiatry;;   AMPUTATION  Right 05/29/2022   Procedure: RIGHT BELOW THE KNEE AMPUTATION;  Surgeon: Harden Jerona GAILS, MD;  Location: [facility] OR;  Service: Orthopedics;  Laterality: Right;   AMPUTATION Left 09/04/2022   Procedure: AMPUTATION FOOT, serial irrigation;  Surgeon: Joya Stabs, DPM;  Location: [facility] OR;  Service: Podiatry;  Laterality: Left;  Surgical team to do block   AMPUTATION Left 10/07/2022   Procedure: LEFT BELOW KNEE AMPUTATION;  Surgeon: Harden Jerona GAILS, MD;  Location: [facility] OR;  Service: Orthopedics;  Laterality: Left;   AMPUTATION FINGER Left 09/29/2023   Procedure: AMPUTATION, FINGER;  Surgeon: Harden Jerona GAILS, MD;  Location: [facility] OR;  Service: Orthopedics;  Laterality: Left;  LEFT HAND LONG FINGER RAY AMPUTATION   AV FISTULA PLACEMENT Left 09/02/2017   Procedure: creation of left arm ARTERIOVENOUS (AV) FISTULA;  Surgeon: Serene Gaile  W, MD;  Location: [facility] OR;  Service: Vascular;  Laterality: Left;   COLONOSCOPY  2008   Dr. Harvey: normal    COLONOSCOPY N/A 12/18/2016   Dr. Harvey: multiple tubular adenomas, internal hemorrhoids. Surveillance in 3 years    ESOPHAGEAL DILATION N/A 10/13/2015   Procedure: ESOPHAGEAL DILATION;  Surgeon: Claudis RAYMOND Rivet, MD;  Location: [facility];  Service: Endoscopy;  Laterality: N/A;   ESOPHAGOGASTRODUODENOSCOPY N/A 10/13/2015   Dr. Rivet: chronic gastritis on path, no H.pylori. Empiric dilation    ESOPHAGOGASTRODUODENOSCOPY N/A 12/18/2016   Dr. Harvey: mild gastritis. BRAVO study revealed uncontrolled GERD. Dysphagia secondary to uncontrolled reflux   FOOT SURGERY Bilateral    nerve     INCISION AND DRAINAGE OF WOUND Left 10/30/2023   Procedure: IRRIGATION AND DEBRIDEMENT WOUND;  Surgeon: Arlinda Buster, MD;  Location: [facility] OR;  Service: Orthopedics;  Laterality: Left;  LEFT HAND WOUND   LEFT HEART CATH AND CORONARY ANGIOGRAPHY N/A 12/29/2018   Procedure: LEFT HEART CATH AND CORONARY ANGIOGRAPHY;  Surgeon: Dann Candyce RAMAN, MD;  Location: [facility] INVASIVE CV LAB;  Service:  Cardiovascular;  Laterality: N/A;   LOWER EXTREMITY ANGIOGRAPHY Right 05/04/2022   Procedure: Lower Extremity Angiography;  Surgeon: Lanis Fonda BRAVO, MD;  Location: [facility] INVASIVE CV LAB;  Service: Cardiovascular;  Laterality: Right;   LUNG BIOPSY     MASS EXCISION Right 01/09/2013   Procedure: EXCISION OF NEOPLASM OF RIGHT  AXILLA  AND EXCISION OF NEOPLASM OF LEFT AXILLA;  Surgeon: Oneil DELENA Budge, MD;  Location: [facility] ORS;  Service: General;  Laterality: Right;  procedure end @ 08:23   MYRINGOTOMY WITH TUBE PLACEMENT Bilateral 04/28/2017   Procedure: BILATERAL MYRINGOTOMY WITH TUBE PLACEMENT;  Surgeon: Karis Clunes, MD;  Location: [facility] OR;  Service: ENT;  Laterality: Bilateral;   PERIPHERAL VASCULAR BALLOON ANGIOPLASTY Right 05/04/2022   Procedure: PERIPHERAL VASCULAR BALLOON ANGIOPLASTY;  Surgeon: Lanis Fonda BRAVO, MD;  Location: [facility] INVASIVE CV LAB;  Service: Cardiovascular;  Laterality: Right;  PT   PERIPHERAL VASCULAR INTERVENTION Right 05/04/2022   Procedure: PERIPHERAL VASCULAR INTERVENTION;  Surgeon: Lanis Fonda BRAVO, MD;  Location: [facility] INVASIVE CV LAB;  Service: Cardiovascular;  Laterality: Right;  SFA   PERIPHERAL VASCULAR INTERVENTION Left 07/21/2022   Procedure: PERIPHERAL VASCULAR INTERVENTION;  Surgeon: Serene Gaile ORN, MD;  [facility];  Service: Cardiovascular;  Laterality: Left;   REVISION OF ARTERIOVENOUS GORETEX GRAFT Left 05/04/2018   Procedure: TRANSPOSITION OF CEPHALIC VEIN ARTERIOVENOUS FISTULA LEFT ARM;  Surgeon: Oris Krystal FALCON, MD;  Location: [facility] OR;  Service: Vascular;  Laterality: Left;   SAVORY DILATION N/A 12/18/2016   Procedure: SAVORY DILATION;  Surgeon: Harvey Margo CROME, MD;  [facility];  Service: Endoscopy;  Laterality: N/A;   TRANSMETATARSAL AMPUTATION Left 08/15/2022   Procedure: TRANSMETATARSAL AMPUTATION;  Surgeon: Silva Juliene SAUNDERS, DPM;  Location: [facility] OR;  Service: Podiatry;  Laterality: Left;    Scheduled Meds:  [START ON 11/11/2023] amLODipine   5 mg  Oral Daily   apixaban   5 mg Oral BID   busPIRone   5 mg Oral BID   [START ON 11/12/2023] calcitRIOL   1 mcg Oral Q M,W,F-HD   cinacalcet   90 mg Oral Once per day on Monday Wednesday Friday   ezetimibe   10 mg Oral Daily   insulin  aspart  0-5 Units Subcutaneous QHS   insulin  aspart  0-6 Units Subcutaneous TID WC   lactobacillus  1 g Oral TID WC   [START ON 11/11/2023] metoprolol  succinate  50 mg  Oral Once per day on Sunday Tuesday Thursday Saturday   sertraline   50 mg Oral Daily   sodium chloride  flush  3 mL Intravenous Q12H   sodium chloride  flush  3 mL Intravenous Q12H   sucroferric oxyhydroxide  1,000 mg Oral TID WC   sucroferric oxyhydroxide  500 mg Oral With snacks   [START ON 11/11/2023] torsemide   100 mg Oral Q T,Th,S,Su   Continuous Infusions:  sodium chloride  100 mL/hr at 11/10/23 1643   [START ON 11/12/2023] ceFAZolin      cefTRIAXone  (ROCEPHIN )  IV     PRN Meds:.acetaminophen  **OR** acetaminophen , bisacodyl , hydrALAZINE , HYDROmorphone  (DILAUDID ) injection, ipratropium, levalbuterol , ondansetron  **OR** ondansetron  (ZOFRAN ) IV, oxyCODONE , senna-docusate, sodium phosphate , traZODone   Allergies  Allergen Reactions   Ace Inhibitors Anaphylaxis and Swelling   Penicillins Itching and Swelling    Has tolerated cefazolin  on multiple occasions    Statins Other (See Comments)    Elevated LFT's     Albuterol  Swelling   Social History   Socioeconomic History   Marital status: Married    Spouse name: Royann Wildasin   Number of children: 2   Years of education: 12   Highest education level: Associate degree: occupational, Scientist, product/process development, or vocational program  Occupational History   Occupation: retired   Tobacco Use   Smoking status: Never    Passive exposure: Never   Smokeless tobacco: Never   Tobacco comments:    Verified by Daughter, [name]  Vaping Use   Vaping status: Never Used  Substance and Sexual Activity   Alcohol use: No   Drug use: No   Sexual activity: Not  Currently    Partners: Male  Other Topics Concern   Not on file  Social History Narrative   Lives alone with husband    Right handed   Caffeine-1/2 daily   Social Drivers of Health   Financial Resource Strain: Low Risk  (05/03/2023)   Overall Financial Resource Strain (CARDIA)    Difficulty of Paying Living Expenses: Not hard at all  Food Insecurity: No Food Insecurity (11/08/2023)   Hunger Vital Sign    Worried About Running Out of Food in the Last Year: Never true    Ran Out of Food in the Last Year: Never true  Transportation Needs: No Transportation Needs (11/08/2023)   PRAPARE - Administrator, Civil Service (Medical): No    Lack of Transportation (Non-Medical): No  Physical Activity: Inactive (05/03/2023)   Exercise Vital Sign    Days of Exercise per Week: 0 days    Minutes of Exercise per Session: 0 min  Stress: No Stress Concern Present (05/03/2023)   [employer] of Occupational Health - Occupational Stress Questionnaire    Feeling of Stress : Not at all  Social Connections: Moderately Isolated (10/29/2023)   Social Connection and Isolation Panel    Frequency of Communication with Friends and Family: More than three times a week    Frequency of Social Gatherings with Friends and Family: More than three times a week    Attends Religious Services: Never    Database administrator or Organizations: No    Attends Banker Meetings: Never    Marital Status: Married  Catering manager Violence: Not At Risk (11/08/2023)   Humiliation, Afraid, Rape, and Kick questionnaire    Fear of Current or Ex-Partner: No    Emotionally Abused: No    Physically Abused: No    Sexually Abused: No   Family History  Problem Relation  Age of Onset   Hypertension Father    Hypercholesterolemia Father    Arthritis Father    Hypertension Sister    Hypercholesterolemia Sister    Breast cancer Sister    Hypertension Sister    Colon cancer Neg Hx    Colon polyps Neg Hx     Vitals BP (!) 159/60   Pulse 67   Temp 98.5 F (36.9 C) (Oral)   Resp 12   SpO2 93%   Lying in the bed and appears comfortable Left hand pictures in chart from today reviewed            Pertinent Microbiology Results for orders placed or performed during the hospital encounter of 10/29/23  Blood culture (routine x 2)     Status: None   Collection Time: 10/29/23 10:29 AM   Specimen: BLOOD  Result Value Ref Range Status   Specimen Description BLOOD BLOOD RIGHT ARM RAC  Final   Special Requests   Final    BOTTLES DRAWN AEROBIC AND ANAEROBIC Blood Culture adequate volume   Culture   Final    NO GROWTH 5 DAYS Performed at [facility], [address] [place], [place] [address]    Report Status 11/03/2023 FINAL  Final  Blood culture (routine x 2)     Status: None   Collection Time: 10/29/23 10:29 AM   Specimen: BLOOD  Result Value Ref Range Status   Specimen Description BLOOD BLOOD RIGHT ARM LOWER ARM  Final   Special Requests   Final    BOTTLES DRAWN AEROBIC AND ANAEROBIC Blood Culture adequate volume   Culture   Final    NO GROWTH 5 DAYS Performed at [facility], [address], [place], [place] [address]    Report Status 11/03/2023 FINAL  Final  Surgical PCR screen     Status: Abnormal   Collection Time: 10/30/23  5:53 AM   Specimen: Nasal Mucosa; Nasal Swab  Result Value Ref Range Status   MRSA, PCR NEGATIVE NEGATIVE Final   Staphylococcus aureus POSITIVE (A) NEGATIVE Final    Comment: (NOTE) The Xpert SA Assay (FDA approved for NASAL specimens in patients 48 years of age and older), is one component of a comprehensive surveillance program. It is not intended to diagnose infection nor to guide or monitor treatment. Performed at [facility], [address], [place], [place] [address]   Aerobic/Anaerobic Culture w Gram Stain (surgical/deep wound)     Status: None   Collection Time: 10/30/23  8:09 AM   Specimen: Wound; Body Fluid  Result Value Ref  Range Status   Specimen Description WOUND  Final   Special Requests LEFT HAND FLUID COLLECTION  Final   Gram Stain   Final    RARE WBC PRESENT, PREDOMINANTLY PMN ABUNDANT GRAM POSITIVE COCCI    Culture   Final    MODERATE STAPHYLOCOCCUS AUREUS NO ANAEROBES ISOLATED Performed at [facility], [address], [place], [place] [address]    Report Status 11/04/2023 FINAL  Final   Organism ID, Bacteria STAPHYLOCOCCUS AUREUS  Final      Susceptibility   Staphylococcus aureus - MIC*    CIPROFLOXACIN  >=8 RESISTANT Resistant     ERYTHROMYCIN >=8 RESISTANT Resistant     GENTAMICIN  >=16 RESISTANT Resistant     OXACILLIN 0.5 SENSITIVE Sensitive     TETRACYCLINE >=16 RESISTANT Resistant     VANCOMYCIN  <=0.5 SENSITIVE Sensitive     TRIMETH /SULFA  >=320 RESISTANT Resistant     CLINDAMYCIN  >=  8 RESISTANT Resistant     RIFAMPIN <=0.5 SENSITIVE Sensitive     Inducible Clindamycin  NEGATIVE Sensitive     LINEZOLID 2 SENSITIVE Sensitive     * MODERATE STAPHYLOCOCCUS AUREUS   *Note: Due to a large number of results and/or encounters for the requested time period, some results have not been displayed. A complete set of results can be found in Results Review.   Pertinent Lab seen by me:    Latest Ref Rng & Units 11/10/2023    9:55 AM 11/05/2023    5:08 AM 11/04/2023    4:36 AM  CBC  WBC 4.0 - 10.5 K/uL 17.6  16.7  17.5   Hemoglobin 12.0 - 15.0 g/dL 89.6  89.7  88.9   Hematocrit 36.0 - 46.0 % 33.1  31.8  34.4   Platelets 150 - 400 K/uL 381  401  433       Latest Ref Rng & Units 11/10/2023    9:55 AM 11/05/2023    5:08 AM 11/04/2023    4:36 AM  CMP  Glucose 70 - 99 mg/dL 73  884  851   BUN 8 - 23 mg/dL 21  56  38   Creatinine 0.44 - 1.00 mg/dL 7.18  4.81  6.10   Sodium 135 - 145 mmol/L 137  140  138   Potassium 3.5 - 5.1 mmol/L 3.5  4.5  4.4   Chloride 98 - 111 mmol/L 97  99  98   CO2 22 - 32 mmol/L 27  28  26    Calcium  8.9 - 10.3 mg/dL 8.2  8.7  9.0   Total Protein 6.5 - 8.1 g/dL 6.2      Total Bilirubin 0.0 - 1.2 mg/dL 0.4     Alkaline Phos 38 - 126 U/L 102     AST 15 - 41 U/L 19     ALT 0 - 44 U/L <5        Pertinent Imagings/Other Imagings Plain films and CT images have been personally visualized and interpreted; radiology reports have been reviewed. Decision making incorporated into the Impression / Recommendations.  CT Head Wo Contrast Result Date: 11/10/2023 EXAM: CT HEAD WITHOUT CONTRAST 11/10/2023 10:52:18 AM TECHNIQUE: CT of the head was performed without the administration of intravenous contrast. Automated exposure control, iterative reconstruction, and/or weight based adjustment of the mA/kV was utilized to reduce the radiation dose to as low as reasonably achievable. COMPARISON: MRI of the head dated 12/17/2022. CLINICAL HISTORY: Mental status change, unknown cause. Pt arrived via [facility] from dialysis, from home, pt was A\T\O this morning 0645 am before Tx and become lethargic and zoned out around 0800 this morning and was slow to respond, pt states she feels a little sleepy but able to answer EMSs questions. FINDINGS: BRAIN AND VENTRICLES: No acute hemorrhage. Gray-white differentiation is preserved. No hydrocephalus. No extra-axial collection. No mass effect or midline shift. Chronic encephalomalacia changes present within the left parietal and occipital lobes. Mild periventricular white matter disease. ORBITS: No acute abnormality. SINUSES: No acute abnormality. SOFT TISSUES AND SKULL: No acute soft tissue abnormality. No skull fracture. Calcifications within the carotid siphons. IMPRESSION: 1. No acute intracranial abnormality. 2. Chronic encephalomalacia changes in the left parietal and occipital lobes. 3. Mild periventricular white matter disease. 4. Calcifications within the carotid siphons. Electronically signed by: Evalene Coho MD 11/10/2023 10:58 AM EDT RP Workstation: [id number]   DG Chest Portable 1 View Result Date: 11/10/2023 CLINICAL DATA:   Confusion.  Lethargy. EXAM: PORTABLE  CHEST 1 VIEW COMPARISON:  10/27/2023. FINDINGS: Stable cardiomegaly. Aortic atherosclerosis. Similar streaky left basilar opacities, favored to reflect atelectasis. The right lung is clear. No sizable pleural effusion or pneumothorax. Degenerative changes of the right glenohumeral joint. No acute osseous abnormality. IMPRESSION: 1. Similar streaky left basilar opacities, favored to reflect atelectasis. 2. Stable cardiomegaly. Electronically Signed   By: Harrietta Sherry M.D.   On: 11/10/2023 10:10   CT Hand Left Wo Contrast Addendum Date: 10/29/2023 ADDENDUM REPORT: 10/29/2023 12:32 ADDENDUM: These results were called by telephone at the time of interpretation on 10/29/2023 at 12:08 pm to provider JOSEPH ZAMMIT , who verbally acknowledged these results. Electronically Signed   By: Harrietta Sherry M.D.   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 Blisters on the posterior left hand. Pain. EXAM: CT OF THE LEFT HAND WITHOUT CONTRAST TECHNIQUE: Multidetector CT imaging of the left hand was performed according to the standard protocol. Multiplanar CT image reconstructions were also generated. RADIATION DOSE REDUCTION: This exam was performed according to the departmental dose-optimization program which includes automated exposure control, adjustment of the mA and/or kV according to patient size and/or use of iterative reconstruction technique. COMPARISON:  Left hand radiograph dated 11/10/2023. FINDINGS: Bones/Joint/Cartilage No acute fracture or dislocation. Status post amputation of the third proximal, middle, and distal phalanges. No evidence of acute osteolysis or erosive changes. Mild degenerative arthropathy throughout the hand. Ligaments Ligaments are suboptimally evaluated by CT. Soft tissue / Muscles and Tendons Blistering along the dorsal hand. Marked subcutaneous edema along the dorsal hand extending  to the underlying extensor digitorum tendons with numerous scattered foci of soft tissue gas. Surrounding fat stranding. No discrete loculated fluid collection. Peripheral vascular calcifications. IMPRESSION: 1. Blistering and marked subcutaneous edema with stranding along the dorsal hand extending to the underlying extensor digitorum tendons with numerous scattered foci of soft tissue gas. These findings are concerning for gas-forming soft tissue infection. No discrete loculated fluid collection. 2. No acute osseous abnormality. 3. Status post amputation of the third digit. Electronically Signed: By: Harrietta Sherry M.D. On: 10/29/2023 12:02   DG Hand Complete Left Result Date: 10/29/2023 CLINICAL DATA:  Pain. EXAM: LEFT HAND - COMPLETE 3+ VIEW COMPARISON:  None Available. FINDINGS: There is amputation of the middle finger at the level of metacarpophalangeal joint. No acute fracture or dislocation. No aggressive osseous lesion. 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 No periarticular osteopenia or bony erosions. No radiopaque foreign bodies. There is diffuse soft tissue swelling over the dorsum of the hand. There are few foci of air within the soft tissue. However, underlying bones appear intact. No focal bone erosions. IMPRESSION: 1. No acute osseous abnormality of the left hand. 2. Amputation of middle finger at the level of metacarpophalangeal joint. 3. There is diffuse soft tissue swelling over the dorsum of the hand. There are few foci of air within the soft tissue. However, underlying bones appear intact. No focal bone erosions. Electronically Signed   By: Ree Molt M.D.   On: 10/29/2023 10:09   US  LT UPPER EXTREM LTD SOFT TISSUE NON VASCULAR Result Date: 10/28/2023 CLINICAL DATA:  Swelling. Amputation of the left long finger on 09/29/2023. EXAM: ULTRASOUND left UPPER EXTREMITY LIMITED TECHNIQUE:  Ultrasound examination of the upper extremity soft tissues was performed in the area of clinical concern. COMPARISON:  None Available. FINDINGS: Targeted sonographic evaluation of the dorsal left  hand corresponding to the area of clinical concern demonstrates diffuse subcutaneous edema and ill-defined fluid without discrete loculated fluid collection. Irregular small echogenic foci with areas of dirty shadowing in the subcutaneous soft tissues could reflect soft tissue air. These findings may be secondary to postsurgical change, however, soft tissue infection with cellulitis and possible soft tissue gas cannot be excluded. IMPRESSION: Targeted sonographic evaluation of the dorsal left hand corresponding to the area of clinical concern demonstrates diffuse subcutaneous edema and ill-defined fluid without discrete loculated fluid collection. Irregular small echogenic foci with areas of dirty shadowing in the subcutaneous soft tissues could reflect soft tissue air. These findings may be secondary to postsurgical change, however, soft tissue infection with cellulitis and possible soft tissue gas cannot be excluded. Consider further evaluation with cross-sectional imaging such as contrast-enhanced CT or MRI. Electronically Signed   By: Harrietta Sherry M.D.   On: 10/28/2023 15:04   DG Chest Portable 1 View Result Date: 10/27/2023 CLINICAL DATA:  Shortness of breath. EXAM: PORTABLE CHEST 1 VIEW COMPARISON:  April 30, 2023. FINDINGS: Stable cardiomegaly. Right lung is clear. Minimal left basilar subsegmental atelectasis is noted. Bony thorax is unremarkable. IMPRESSION: Minimal left basilar subsegmental atelectasis. Electronically Signed   By: Lynwood Landy Raddle M.D.   On: 10/27/2023 12:08   I discussed the assessment and treatment plan with the patient. The patient was provided an opportunity to ask questions and all were answered. The patient agreed with the plan and demonstrated an understanding of the  instructions.   The patient was advised to call back or seek an in-person evaluation if the symptoms worsen or if the condition fails to improve as anticipated.  I spent 82 minutes involved in face-to-face and non-face-to-face activities for this patient on the day of the visit. Professional time spent includes the following activities: Preparing to see the patient (review of tests), Obtaining and reviewing separately obtained history (ED note and H and P npte), Performing a medically appropriate examination and evaluation, Ordering medications/labs, referring and communicating with other health care professionals, Documenting clinical information in the EMR, Independently interpreting results (not separately reported), Communicating results to the patient, Counseling and educating the patient and Care coordination (not separately reported).  Electronically signed by:   Plan d/w requesting provider as well as ID pharm D  Of note, portions of this note may have been created with voice recognition software. While this note has been edited for accuracy, occasional wrong-word or 'sound-a-like' substitutions may have occurred due to the inherent limitations of voice recognition software.   Annalee Orem, MD Infectious Disease Physician [facility] Infectious Disease Pager: [phone]

## 2023-11-10 NOTE — ED Notes (Signed)
 Dr Willette in to take pics of left hand wounds. Drain stil in place. Area lightly wrapped with  abd pad and sleeve placed until wound consult advised of wound care. Brace placed back on pt that she came in with.

## 2023-11-10 NOTE — Assessment & Plan Note (Signed)
-   Minimizing narcotics due to altered mental status

## 2023-11-11 DIAGNOSIS — K921 Melena: Secondary | ICD-10-CM | POA: Diagnosis not present

## 2023-11-11 DIAGNOSIS — D649 Anemia, unspecified: Secondary | ICD-10-CM | POA: Diagnosis not present

## 2023-11-11 DIAGNOSIS — Z992 Dependence on renal dialysis: Secondary | ICD-10-CM | POA: Diagnosis not present

## 2023-11-11 DIAGNOSIS — R6510 Systemic inflammatory response syndrome (SIRS) of non-infectious origin without acute organ dysfunction: Secondary | ICD-10-CM | POA: Diagnosis not present

## 2023-11-11 DIAGNOSIS — I4891 Unspecified atrial fibrillation: Secondary | ICD-10-CM

## 2023-11-11 DIAGNOSIS — K922 Gastrointestinal hemorrhage, unspecified: Secondary | ICD-10-CM | POA: Diagnosis not present

## 2023-11-11 DIAGNOSIS — I120 Hypertensive chronic kidney disease with stage 5 chronic kidney disease or end stage renal disease: Secondary | ICD-10-CM | POA: Diagnosis not present

## 2023-11-11 DIAGNOSIS — D631 Anemia in chronic kidney disease: Secondary | ICD-10-CM | POA: Diagnosis not present

## 2023-11-11 DIAGNOSIS — N250 Renal osteodystrophy: Secondary | ICD-10-CM | POA: Diagnosis not present

## 2023-11-11 DIAGNOSIS — N186 End stage renal disease: Secondary | ICD-10-CM | POA: Diagnosis not present

## 2023-11-11 DIAGNOSIS — Z7901 Long term (current) use of anticoagulants: Secondary | ICD-10-CM | POA: Diagnosis not present

## 2023-11-11 DIAGNOSIS — N3001 Acute cystitis with hematuria: Secondary | ICD-10-CM | POA: Diagnosis not present

## 2023-11-11 DIAGNOSIS — L02512 Cutaneous abscess of left hand: Secondary | ICD-10-CM | POA: Diagnosis not present

## 2023-11-11 DIAGNOSIS — N390 Urinary tract infection, site not specified: Secondary | ICD-10-CM | POA: Diagnosis not present

## 2023-11-11 LAB — FOLATE: Folate: 17.3 ng/mL (ref >5.9)

## 2023-11-11 LAB — URINE CULTURE: Culture: NO GROWTH

## 2023-11-11 LAB — GLUCOSE, CAPILLARY
Glucose-Capillary: 92 mg/dL (ref 70–99)
Glucose-Capillary: 162 mg/dL — ABNORMAL HIGH (ref 70–99)
Glucose-Capillary: 117 mg/dL — ABNORMAL HIGH (ref 70–99)
Glucose-Capillary: 138 mg/dL — ABNORMAL HIGH (ref 70–99)

## 2023-11-11 LAB — VITAMIN B12: Vitamin B-12: 997 pg/mL — ABNORMAL HIGH (ref 180–914)

## 2023-11-11 LAB — BASIC METABOLIC PANEL WITH GFR
Anion gap: 10 (ref 5–15)
BUN: 29 mg/dL — ABNORMAL HIGH (ref 8–23)
CO2: 28 mmol/L (ref 22–32)
Calcium: 7.9 mg/dL — ABNORMAL LOW (ref 8.9–10.3)
Chloride: 100 mmol/L (ref 98–111)
Creatinine, Ser: 3.69 mg/dL — ABNORMAL HIGH (ref 0.44–1.00)
GFR, Estimated: 13 mL/min — ABNORMAL LOW (ref >60)
Glucose, Bld: 99 mg/dL (ref 70–99)
Potassium: 3.8 mmol/L (ref 3.5–5.1)
Sodium: 138 mmol/L (ref 135–145)

## 2023-11-11 LAB — CBC
HCT: 28.9 % — ABNORMAL LOW (ref 36.0–46.0)
Hemoglobin: 8.7 g/dL — ABNORMAL LOW (ref 12.0–15.0)
MCH: 24.2 pg — ABNORMAL LOW (ref 26.0–34.0)
MCHC: 30.1 g/dL (ref 30.0–36.0)
MCV: 80.3 fL (ref 80.0–100.0)
Platelets: 352 K/uL (ref 150–400)
RBC: 3.6 MIL/uL — ABNORMAL LOW (ref 3.87–5.11)
RDW: 19.7 % — ABNORMAL HIGH (ref 11.5–15.5)
WBC: 16.3 K/uL — ABNORMAL HIGH (ref 4.0–10.5)
nRBC: 0.4 % — ABNORMAL HIGH (ref 0.0–0.2)

## 2023-11-11 LAB — IRON AND TIBC: Iron: 27 ug/dL — ABNORMAL LOW (ref 28–170)

## 2023-11-11 LAB — HEMOGLOBIN AND HEMATOCRIT, BLOOD
HCT: 32.5 % — ABNORMAL LOW (ref 36.0–46.0)
Hemoglobin: 10 g/dL — ABNORMAL LOW (ref 12.0–15.0)

## 2023-11-11 LAB — OCCULT BLOOD X 1 CARD TO LAB, STOOL: Fecal Occult Bld: POSITIVE — AB

## 2023-11-11 LAB — HEPATITIS B SURFACE ANTIGEN: Hepatitis B Surface Ag: NONREACTIVE

## 2023-11-11 LAB — PROTIME-INR
INR: 1.3 — ABNORMAL HIGH (ref 0.8–1.2)
Prothrombin Time: 17.3 s — ABNORMAL HIGH (ref 11.4–15.2)

## 2023-11-11 LAB — APTT: aPTT: 25 s (ref 24–36)

## 2023-11-11 MED ORDER — AMLODIPINE BESYLATE 5 MG PO TABS
5.0000 mg | ORAL_TABLET | Freq: Every day | ORAL | Status: DC
  Filled 2023-11-11: qty 1

## 2023-11-11 MED ORDER — CHLORHEXIDINE GLUCONATE CLOTH 2 % EX PADS
6.0000 | MEDICATED_PAD | Freq: Every day | CUTANEOUS | Status: DC
  Administered 2023-11-11: 6 via TOPICAL

## 2023-11-11 MED ORDER — PANTOPRAZOLE SODIUM 40 MG PO TBEC
40.0000 mg | DELAYED_RELEASE_TABLET | Freq: Every day | ORAL | Status: DC
  Administered 2023-11-11 – 2023-11-13 (×3): 40 mg via ORAL
  Filled 2023-11-11 (×4): qty 1

## 2023-11-11 MED ORDER — METOPROLOL SUCCINATE ER 50 MG PO TB24
100.0000 mg | ORAL_TABLET | ORAL | Status: DC
  Administered 2023-11-13: 100 mg via ORAL
  Filled 2023-11-11: qty 2

## 2023-11-11 MED ORDER — APIXABAN 5 MG PO TABS: 5.0000 mg | ORAL_TABLET | Freq: Two times a day (BID) | ORAL | Status: DC

## 2023-11-11 MED ORDER — SENNOSIDES-DOCUSATE SODIUM 8.6-50 MG PO TABS
1.0000 | ORAL_TABLET | Freq: Two times a day (BID) | ORAL | Status: DC
  Administered 2023-11-11 – 2023-11-13 (×2): 1 via ORAL
  Filled 2023-11-11 (×4): qty 1

## 2023-11-11 MED ORDER — SILVER SULFADIAZINE 1 % EX CREA
TOPICAL_CREAM | Freq: Two times a day (BID) | CUTANEOUS | Status: DC
  Filled 2023-11-11: qty 85

## 2023-11-11 NOTE — Progress Notes (Signed)
 Hypoglycemic Event  CBG: 63  Treatment: 8 oz juice/soda, snack  Symptoms: None  Follow-up CBG: Time:2245 CBG Result:113  Possible Reasons for Event: Inadequate meal intake   [name]

## 2023-11-11 NOTE — Progress Notes (Addendum)
 RN called due to patient having a dark stool.  Stool occult test was done and was positive.  Patient was in no acute distress.  Gastroenterology consult was placed due to GI bleed  Total time:  17 minutes This includes time reviewing the chart including progress notes, labs, EKGs, taking medical decisions, ordering labs and documenting findings.

## 2023-11-11 NOTE — Progress Notes (Incomplete)
 [facility] Infectious Diseases Follow Up Note  Patient Identification: Patient Name: Kathryn Beck MRN: [id number] Admit Date: 11/10/2023  9:13 AM Age: 68 y.o.Today's Date: 11/11/2023  Unable to get video cart and chart reviewed  Reason for Visit: Left hand MSSA Infection   Principal Problem:   SIRS (systemic inflammatory response syndrome) (HCC) Active Problems:   Hyperlipidemia   Anemia of chronic disease   Chronic diastolic (congestive) heart failure (HCC)   HTN (hypertension)   Diabetes mellitus (HCC)   Chronic pain syndrome   ESRD on hemodialysis (HCC)   Morbid obesity (HCC)   S/P BKA (below knee amputation), right (HCC)   Amputated left leg (HCC)   Acute confusion   CVA (cerebral vascular accident) (HCC)   Depression, major, single episode, moderate (HCC)   GAD (generalized anxiety disorder)   Uncontrolled type 2 diabetes mellitus with hyperglycemia (HCC)   Afib (HCC)   Abscess of left hand  Antibiotics:  Ceftriaxone  8/20 Cafazolin with HD PTA, restarted 8/20   Lines/Hardware:AV access ? Fistula   Interval Events: remains afebrile Labs remarkable for WBC down to 16.3, hb down to 8.7>10   Assessment 68 year old female with history of A-fib on AC, type 2 DM, PAD s/p bilateral BKA, left long finger ray amputation, AAS, CHF, ESRD on HD, HTN,, HLD, obesity/OSA, depression, CVA, bilateral adrenal adenoma, who presented to the ED from dialysis mid treatment for being lethargic, and slow to respond, staring and less conversive than normal.  She was recently admitted 8/8-8/15 for left hand abscess and underwent I&D, getting cefazolin  with HD.   # Acute onset AMS, resolved - in the setting of hypoglycemia  - 8/20 CT head with no acute abnormality    # Left hand infection/abscess  - 8/9 s/p I and D. Cx MSSA - Discharged 8/15 to complete 2-3 weeks of IV cefazolin  with HD - RN at bedside reports some tenderness while  unwrapping bandage and some serosanguinous drainage.    # Abnormal UA - makes little urine, denies GU symptoms - Unlikely UTI    # Leukocytosis  - improving    # Acute on chronic anemia ( AOCD, IDA) - Hemoccult+ - AC on hold  _ GI consulted  Recommendations - continue IV cefazolin  inpatient, can do with HD 2 g with HD on discharge. EOT 9/4.  - monitor CBC and BMP on antibiotics.  - patient has a previous fu appt at [facility] on 9/4 at 3: 30 pm  - Universal/standard isolation precautions  ID will so, recall back with questions or concerns.   Rest of the management as per the primary team. Thank you for the consult. Please page with pertinent questions or concerns.  ______________________________________________________________________ Subjective Unable to see by video, unable to get cart  Vitals BP (!) 149/62 (BP Location: Right Arm)   Pulse 71   Temp 98.7 F (37.1 C) (Oral)   Resp 16   Wt 77.1 kg   SpO2 99%   BMI 28.29 kg/m   Exam None   Pertinent Microbiology Results for orders placed or performed during the hospital encounter of 11/10/23  Culture, blood (Routine X 2) w Reflex to ID Panel     Status: None (Preliminary result)   Collection Time: 11/10/23  1:01 PM   Specimen: BLOOD  Result Value Ref Range Status   Specimen Description BLOOD BLOOD RIGHT ARM  Final   Special Requests   Final    Blood Culture adequate volume BOTTLES DRAWN AEROBIC AND ANAEROBIC  Culture   Final    NO GROWTH < 24 HOURS Performed at [facility], [address] [place], [place] [address]    Report Status PENDING  Incomplete  Culture, blood (Routine X 2) w Reflex to ID Panel     Status: None (Preliminary result)   Collection Time: 11/10/23  1:01 PM   Specimen: BLOOD  Result Value Ref Range Status   Specimen Description BLOOD BLOOD RIGHT HAND  Final   Special Requests   Final    BOTTLES DRAWN AEROBIC ONLY Blood Culture results may not be optimal due to an inadequate volume of blood  received in culture bottles   Culture   Final    NO GROWTH < 24 HOURS Performed at [facility], [address], [place], [place] [address]    Report Status PENDING  Incomplete   *Note: Due to a large number of results and/or encounters for the requested time period, some results have not been displayed. A complete set of results can be found in Results Review.   Pertinent Lab.    Latest Ref Rng & Units 11/11/2023    4:27 AM 11/10/2023    9:55 AM 11/05/2023    5:08 AM  CBC  WBC 4.0 - 10.5 K/uL 16.3  17.6  16.7   Hemoglobin 12.0 - 15.0 g/dL 8.7  89.6  89.7   Hematocrit 36.0 - 46.0 % 28.9  33.1  31.8   Platelets 150 - 400 K/uL 352  381  401       Latest Ref Rng & Units 11/11/2023    4:27 AM 11/10/2023    9:55 AM 11/05/2023    5:08 AM  CMP  Glucose 70 - 99 mg/dL 99  73  884   BUN 8 - 23 mg/dL 29  21  56   Creatinine 0.44 - 1.00 mg/dL 6.30  7.18  4.81   Sodium 135 - 145 mmol/L 138  137  140   Potassium 3.5 - 5.1 mmol/L 3.8  3.5  4.5   Chloride 98 - 111 mmol/L 100  97  99   CO2 22 - 32 mmol/L 28  27  28    Calcium  8.9 - 10.3 mg/dL 7.9  8.2  8.7   Total Protein 6.5 - 8.1 g/dL  6.2    Total Bilirubin 0.0 - 1.2 mg/dL  0.4    Alkaline Phos 38 - 126 U/L  102    AST 15 - 41 U/L  19    ALT 0 - 44 U/L  <5      Pertinent Imaging today Plain films and CT images have been personally visualized and interpreted; radiology reports have been reviewed. Decision making incorporated into the Impression /   CT Head Wo Contrast Result Date: 11/10/2023 EXAM: CT HEAD WITHOUT CONTRAST 11/10/2023 10:52:18 AM TECHNIQUE: CT of the head was performed without the administration of intravenous contrast. Automated exposure control, iterative reconstruction, and/or weight based adjustment of the mA/kV was utilized to reduce the radiation dose to as low as reasonably achievable. COMPARISON: MRI of the head dated 12/17/2022. CLINICAL HISTORY: Mental status change, unknown cause. Pt arrived via [facility] from dialysis,  from home, pt was A\T\O this morning 0645 am before Tx and become lethargic and zoned out around 0800 this morning and was slow to respond, pt states she feels a little sleepy but able to answer EMSs questions. FINDINGS: BRAIN AND VENTRICLES: No acute hemorrhage. Gray-white differentiation is preserved. No hydrocephalus. No extra-axial collection. No mass effect  or midline shift. Chronic encephalomalacia changes present within the left parietal and occipital lobes. Mild periventricular white matter disease. ORBITS: No acute abnormality. SINUSES: No acute abnormality. SOFT TISSUES AND SKULL: No acute soft tissue abnormality. No skull fracture. Calcifications within the carotid siphons. IMPRESSION: 1. No acute intracranial abnormality. 2. Chronic encephalomalacia changes in the left parietal and occipital lobes. 3. Mild periventricular white matter disease. 4. Calcifications within the carotid siphons. Electronically signed by: Evalene Coho MD 11/10/2023 10:58 AM EDT RP Workstation: [id number]   DG Chest Portable 1 View Result Date: 11/10/2023 CLINICAL DATA:  Confusion.  Lethargy. EXAM: PORTABLE CHEST 1 VIEW COMPARISON:  10/27/2023. FINDINGS: Stable cardiomegaly. Aortic atherosclerosis. Similar streaky left basilar opacities, favored to reflect atelectasis. The right lung is clear. No sizable pleural effusion or pneumothorax. Degenerative changes of the right glenohumeral joint. No acute osseous abnormality. IMPRESSION: 1. Similar streaky left basilar opacities, favored to reflect atelectasis. 2. Stable cardiomegaly. Electronically Signed   By: Harrietta Sherry M.D.   On: 11/10/2023 10:10   CT Hand Left Wo Contrast Addendum Date: 10/29/2023 ADDENDUM REPORT: 10/29/2023 12:32 ADDENDUM: These results were called by telephone at the time of interpretation on 10/29/2023 at 12:08 pm to provider JOSEPH ZAMMIT , who verbally acknowledged these results. Electronically Signed   By: Harrietta Sherry M.D.   On:  10/29/2023 12:32   Result Date: 10/29/2023 CLINICAL DATA:  History of middle finger amputation on 09/29/2023. Blisters on the posterior left hand. Pain. EXAM: CT OF THE LEFT HAND WITHOUT CONTRAST TECHNIQUE: Multidetector CT imaging of the left hand was performed according to the standard protocol. Multiplanar CT image reconstructions were also generated. RADIATION DOSE REDUCTION: This exam was performed according to the departmental dose-optimization program which includes automated exposure control, adjustment of the mA and/or kV according to patient size and/or use of iterative reconstruction technique. COMPARISON:  Left hand radiograph dated 11/10/2023. FINDINGS: Bones/Joint/Cartilage No acute fracture or dislocation. Status post amputation of the third proximal, middle, and distal phalanges. No evidence of acute osteolysis or erosive changes. Mild degenerative arthropathy throughout the hand. Ligaments Ligaments are suboptimally evaluated by CT. Soft tissue / Muscles and Tendons Blistering along the dorsal hand. Marked subcutaneous edema along the dorsal hand extending to the underlying extensor digitorum tendons with numerous scattered foci of soft tissue gas. Surrounding fat stranding. No discrete loculated fluid collection. Peripheral vascular calcifications. IMPRESSION: 1. Blistering and marked subcutaneous edema with stranding along the dorsal hand extending to the underlying extensor digitorum tendons with numerous scattered foci of soft tissue gas. These findings are concerning for gas-forming soft tissue infection. No discrete loculated fluid collection. 2. No acute osseous abnormality. 3. Status post amputation of the third digit. Electronically Signed: By: Harrietta Sherry M.D. On: 10/29/2023 12:02   DG Hand Complete Left Result Date: 10/29/2023 CLINICAL DATA:  Pain. EXAM: LEFT HAND - COMPLETE 3+ VIEW COMPARISON:  None Available. FINDINGS: There is amputation of the middle finger at the level of  metacarpophalangeal joint. No acute fracture or dislocation. No aggressive osseous lesion. 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 No periarticular osteopenia or bony erosions. No radiopaque foreign bodies. There is diffuse soft tissue swelling over the dorsum of the hand. There are few foci of air within the soft tissue. However, underlying bones appear intact. No focal bone erosions. IMPRESSION: 1. No acute osseous abnormality of the left hand. 2. Amputation of middle finger at the level of metacarpophalangeal joint. 3. There is  diffuse soft tissue swelling over the dorsum of the hand. There are few foci of air within the soft tissue. However, underlying bones appear intact. No focal bone erosions. Electronically Signed   By: Ree Molt M.D.   On: 10/29/2023 10:09   US  LT UPPER EXTREM LTD SOFT TISSUE NON VASCULAR Result Date: 10/28/2023 CLINICAL DATA:  Swelling. Amputation of the left long finger on 09/29/2023. EXAM: ULTRASOUND left UPPER EXTREMITY LIMITED TECHNIQUE: Ultrasound examination of the upper extremity soft tissues was performed in the area of clinical concern. COMPARISON:  None Available. FINDINGS: Targeted sonographic evaluation of the dorsal left hand corresponding to the area of clinical concern demonstrates diffuse subcutaneous edema and ill-defined fluid without discrete loculated fluid collection. Irregular small echogenic foci with areas of dirty shadowing in the subcutaneous soft tissues could reflect soft tissue air. These findings may be secondary to postsurgical change, however, soft tissue infection with cellulitis and possible soft tissue gas cannot be excluded. IMPRESSION: Targeted sonographic evaluation of the dorsal left hand corresponding to the area of clinical concern demonstrates diffuse subcutaneous edema and ill-defined fluid without discrete loculated fluid collection.  Irregular small echogenic foci with areas of dirty shadowing in the subcutaneous soft tissues could reflect soft tissue air. These findings may be secondary to postsurgical change, however, soft tissue infection with cellulitis and possible soft tissue gas cannot be excluded. Consider further evaluation with cross-sectional imaging such as contrast-enhanced CT or MRI. Electronically Signed   By: Harrietta Sherry M.D.   On: 10/28/2023 15:04   DG Chest Portable 1 View Result Date: 10/27/2023 CLINICAL DATA:  Shortness of breath. EXAM: PORTABLE CHEST 1 VIEW COMPARISON:  April 30, 2023. FINDINGS: Stable cardiomegaly. Right lung is clear. Minimal left basilar subsegmental atelectasis is noted. Bony thorax is unremarkable. IMPRESSION: Minimal left basilar subsegmental atelectasis. Electronically Signed   By: Lynwood Landy Raddle M.D.   On: 10/27/2023 12:08     I spent 50 minutes involved in face-to-face and non-face-to-face activities for this patient on the day of the visit. Professional time spent includes the following activities: Preparing to see the patient (review of tests), Obtaining and reviewing separately obtained history (admission/discharge record), Performing a medically appropriate examination and evaluation , Ordering medications/labs, referring and communicating with other health care professionals, Documenting clinical information in the EMR, Independently interpreting results (not separately reported), Communicating results to the patient/family/caregiver, Counseling and educating the patient/family/caregiver and Care coordination (not separately reported).   Plan d/w requesting provider as well as ID pharm D  Of note, portions of this note may have been created with voice recognition software. While this note has been edited for accuracy, occasional wrong-word or 'sound-a-like' substitutions may have occurred due to the inherent limitations of voice recognition software.   Electronically signed  by:   Annalee Orem, MD Infectious Disease Physician [facility] for Infectious Disease Pager: [phone]

## 2023-11-11 NOTE — Consult Note (Signed)
 Gastroenterology Consult   Referring Provider: No ref. provider found Primary Care Physician:  Antonetta Rollene BRAVO, MD Primary Gastroenterologist:  formerly Dr. Harvey  Patient ID: Kathryn Beck; [id number]; 01-23-1956   Admit date: 11/10/2023  LOS: 0 days   Date of Consultation: 11/11/2023  Reason for Consultation:  dark stools, hemoccult positive    History of Present Illness   Kathryn Beck is a 68 y.o. female with PMH significant for GERD, pancreatitis, moderate aortic stenosis, recent new onset Afib on apixaban  with cardioversion 10/27/23, PAD (s/p R BKA in 05/2022, prior lithotripsy and stenting of the left superficial femoral and popliteal artery in 06/2022 and ultimately L BKA in 09/2022), HTN, HLD, Type 2 DM, bilateral adrenal adenoma (followed by Endocrinology), chronic HFpEF, aortic stenosis, OSA, ESRD (MWF), history of TIA's, history of recurrent chest pain  (cath in 12/2018 showing normal coronary arteries) recent hospitalization for left hand I&D, MSSA infection on antibiotics IV Ancef  with HD who presented from hemodialysis due to drowsiness, confusion.Patient met SIRS criteria due to WBC (suspect UTI, hand abscess stable) and MS changes (likely related to hypoglycemia), plans for admission.  GI consulted for dark hemoccult positive stool occurring this morning.   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 This is not unusual for her. 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 She reports her stools otherwise are normal. No brbpr. No abdominal pain. No heartburn, dysphagia, n/v. From a GI standpoint she feels fine.   Her Hgb is typically in the 10 range. On 11/05/23, Hgb 10.2 On 11/10/23, Hgb 10.3, Today Hgb 8.7. iron 27. B12 997, folate 17.3.   Her stool was hemoccult positive.   WBC 16.3 down from 17.6 yesterday. Platelets 352.  Blood, urine cultures pending.   EGD 06/2017 at [facility]: op note not  viewable Path-mild chronic gastritis and no intestinal metaplasia, reactive folveolar hyperplasia, no h.pylori. negative proximal and distal esophageal biopsies.   EGD 11/2016: Dr. Harvey -no source for dysphagia -mild gastritis -bravo placed, omeprazole  does not control acid reflux  Colonoscopy 11/2016: -3 polyps removed from the rectum, tubular adenomas -redundant colon -internal hemorrhoids -next colonoscopy in 3 years     Prior to Admission medications   Medication Sig Start Date End Date Taking? Authorizing Provider  acetaminophen  (TYLENOL ) 500 MG tablet Take 1,000 mg by mouth every 6 (six) hours as needed for mild pain (pain score 1-3).   Yes [provider]  amLODipine  (NORVASC ) 10 MG tablet Take 0.5 tablets (5 mg total) by mouth daily. 10/28/23  Yes Emokpae, Courage, MD  apixaban  (ELIQUIS ) 5 MG TABS tablet Take 1 tablet (5 mg total) by mouth 2 (two) times daily. For Afib 10/29/23  Yes Emokpae, Courage, MD  aspirin  81 MG chewable tablet Chew 2 tablets (162 mg total) by mouth daily. 09/16/23  Yes Bevely Doffing, FNP  B Complex-C-Folic Acid  (RENA-VITE RX) 1 MG TABS Take 1 tablet by mouth daily. 04/15/22  Yes [provider]  busPIRone  (BUSPAR ) 5 MG tablet TAKE ONE TABLET BY MOUTH TWICE DAILY 10/20/23  Yes Antonetta Rollene BRAVO, MD  calcitRIOL  (ROCALTROL ) 0.5 MCG capsule Take 2 capsules (1 mcg total) by mouth every Monday, Wednesday, and Friday with hemodialysis. 11/05/23  Yes Amin, Ankit C, MD  ceFAZolin  (ANCEF ) 2-4 GM/100ML-% IVPB Inject 100 mLs (2 g total) into the vein every Monday, Wednesday, and Friday with hemodialysis for 20 days. 11/05/23 11/25/23 Yes Caleen Burgess BROCKS, MD  cinacalcet  (  SENSIPAR ) 30 MG tablet Take 3 tablets (90 mg total) by mouth 3 (three) times a week. Patient taking differently: Take 90 mg by mouth every Monday, Wednesday, and Friday. 09/09/22  Yes Sheikh, Omair Latif, DO  Darbepoetin Alfa  (ARANESP ) 40 MCG/0.4ML SOSY injection Inject 0.4 mLs (40 mcg total)  into the skin every Friday at 6 PM. Patient taking differently: Inject 40 mcg into the vein every Friday at 6 PM. 11/05/23  Yes Amin, Ankit C, MD  DULoxetine  (CYMBALTA ) 60 MG capsule TAKE (1) CAPSULE BY MOUTH ONCE DAILY. Patient taking differently: Take 60 mg by mouth daily. TAKE (1) CAPSULE BY MOUTH ONCE DAILY. 04/05/23  Yes Antonetta Rollene BRAVO, MD  ezetimibe  (ZETIA ) 10 MG tablet Take 1 tablet (10 mg total) by mouth daily. 04/27/23  Yes Antonetta Rollene BRAVO, MD  insulin  glargine, 2 Unit Dial , (TOUJEO  MAX SOLOSTAR) 300 UNIT/ML Solostar Pen Inject 30 Units into the skin daily. May need to slowly increase the dose depending upon your blood sugar, follow-up with PCP 01/01/23  Yes Trixie File, MD  metoprolol  succinate (TOPROL -XL) 50 MG 24 hr tablet TAKE (1) TABLET BY MOUTH DAILY WITH FOOD *TAKE AFTER DIALYSIS* Patient taking differently: Take 50 mg by mouth every Tuesday, Thursday, Saturday, and Sunday. Non-dialysis days 01/05/23  Yes Antonetta Rollene BRAVO, MD  torsemide  (DEMADEX ) 100 MG tablet Take 1 tablet (100 mg total) by mouth every Tuesday, Thursday, Saturday, and Sunday. Patient taking differently: Take 100 mg by mouth every Tuesday, Thursday, Saturday, and Sunday. Non-dialysis days 10/28/23  Yes Emokpae, Courage, MD  [name]  500 MG chewable tablet Chew 1-2 tablets (500-1,000 mg total) by mouth See admin instructions. Take 1000mg  (2 tablets) by mouth with meals and 500mg  (1 tablet) with snacks 08/23/23  Yes Antonetta Rollene BRAVO, MD  sertraline  (ZOLOFT ) 50 MG tablet TAKE ONE TABLET BY MOUTH ONCE DAILY Patient not taking: Reported on 11/10/2023 11/09/23   Antonetta Rollene BRAVO, MD  FLUoxetine (PROZAC) 10 MG capsule Take 10 mg by mouth daily.    05/28/11  [provider]  glipiZIDE  (GLUCOTROL ) 10 MG tablet Take 10 mg by mouth 2 (two) times daily before a meal.    05/28/11  [provider]    Current Facility-Administered Medications  Medication Dose Route Frequency Provider Last Rate Last  Admin   acetaminophen  (TYLENOL ) tablet 650 mg  650 mg Oral Q6H PRN Shahmehdi, Seyed A, MD       Or   acetaminophen  (TYLENOL ) suppository 650 mg  650 mg Rectal Q6H PRN Shahmehdi, Adriana LABOR, MD       [START ON 11/12/2023] amLODipine  (NORVASC ) tablet 5 mg  5 mg Oral Daily Shahmehdi, Seyed A, MD       [START ON 11/12/2023] apixaban  (ELIQUIS ) tablet 5 mg  5 mg Oral BID Shahmehdi, Seyed A, MD       busPIRone  (BUSPAR ) tablet 5 mg  5 mg Oral BID Shahmehdi, Seyed A, MD   5 mg at 11/11/23 0849   [START ON 11/12/2023] calcitRIOL  (ROCALTROL ) capsule 1 mcg  1 mcg Oral Q M,W,F-HD Shahmehdi, Seyed A, MD       [START ON 11/12/2023] ceFAZolin  (ANCEF ) IVPB 2g/100 mL premix  2 g Intravenous Q M,W,F-HD Shahmehdi, Seyed A, MD       Chlorhexidine  Gluconate Cloth 2 % PADS 6 each  6 each Topical Q0600 Rayburn Pac, MD       cinacalcet  (SENSIPAR ) tablet 90 mg  90 mg Oral Once per day on Monday Wednesday Friday Shahmehdi, Seyed A, MD  90 mg at 11/10/23 1644   ezetimibe  (ZETIA ) tablet 10 mg  10 mg Oral Daily Shahmehdi, Seyed A, MD   10 mg at 11/11/23 0848   hydrALAZINE  (APRESOLINE ) injection 10 mg  10 mg Intravenous Q4H PRN Shahmehdi, Seyed A, MD       HYDROmorphone  (DILAUDID ) injection 0.5-1 mg  0.5-1 mg Intravenous Q2H PRN Shahmehdi, Seyed A, MD       insulin  aspart (novoLOG ) injection 0-5 Units  0-5 Units Subcutaneous QHS Shahmehdi, Seyed A, MD       insulin  aspart (novoLOG ) injection 0-6 Units  0-6 Units Subcutaneous TID WC Shahmehdi, Seyed A, MD       ipratropium (ATROVENT ) nebulizer solution 0.5 mg  0.5 mg Nebulization Q6H PRN Shahmehdi, Seyed A, MD       lactobacillus (FLORANEX/LACTINEX) granules 1 g  1 g Oral TID WC Shahmehdi, Seyed A, MD   1 g at 11/11/23 0848   levalbuterol  (XOPENEX ) nebulizer solution 0.63 mg  0.63 mg Nebulization Q6H PRN Willette Adriana LABOR, MD       [START ON 11/13/2023] metoprolol  succinate (TOPROL -XL) 24 hr tablet 100 mg  100 mg Oral Once per day on Sunday Tuesday Thursday Saturday Shahmehdi,  Adriana LABOR, MD       ondansetron  (ZOFRAN ) tablet 4 mg  4 mg Oral Q6H PRN Shahmehdi, Seyed A, MD       Or   ondansetron  (ZOFRAN ) injection 4 mg  4 mg Intravenous Q6H PRN Shahmehdi, Seyed A, MD       oxyCODONE  (Oxy IR/ROXICODONE ) immediate release tablet 5 mg  5 mg Oral Q4H PRN Shahmehdi, Seyed A, MD   5 mg at 11/10/23 1952   senna-docusate (Senokot-S) tablet 1 tablet  1 tablet Oral BID Shahmehdi, Seyed A, MD       sertraline  (ZOLOFT ) tablet 50 mg  50 mg Oral Daily Shahmehdi, Seyed A, MD   50 mg at 11/11/23 0848   silver  sulfADIAZINE  (SILVADENE ) 1 % cream   Topical BID Shahmehdi, Seyed A, MD       sodium chloride  flush (NS) 0.9 % injection 3 mL  3 mL Intravenous Q12H Shahmehdi, Seyed A, MD   3 mL at 11/10/23 2150   sodium chloride  flush (NS) 0.9 % injection 3 mL  3 mL Intravenous Q12H Shahmehdi, Seyed A, MD   3 mL at 11/11/23 0856   sodium phosphate  (FLEET) enema 1 enema  1 enema Rectal Once PRN Shahmehdi, Seyed A, MD       sucroferric oxyhydroxide ([name] ) chewable tablet 1,000 mg  1,000 mg Oral TID WC Shahmehdi, Seyed A, MD   1,000 mg at 11/11/23 9089   sucroferric oxyhydroxide ([name] ) chewable tablet 500 mg  500 mg Oral With snacks Shahmehdi, Seyed A, MD   500 mg at 11/10/23 2155   torsemide  (DEMADEX ) tablet 100 mg  100 mg Oral Q T,Th,S,Su Shahmehdi, Seyed A, MD   100 mg at 11/11/23 9150   traZODone  (DESYREL ) tablet 25 mg  25 mg Oral QHS PRN Shahmehdi, Seyed A, MD       Facility-Administered Medications Ordered in Other Encounters  Medication Dose Route Frequency Provider Last Rate Last Admin   0.9 %  sodium chloride  infusion   Intravenous Continuous Lockamy, Randi L, NP-C 0 mL/hr at 10/07/22 1422 New Bag at 09/29/23 0813    Allergies as of 11/10/2023 - Review Complete 11/10/2023  Allergen Reaction Noted   Ace inhibitors Anaphylaxis and Swelling 06/07/2007   Penicillins Itching and Swelling 06/07/2007   Statins  Other (See Comments) 12/24/2012   Albuterol  Swelling 08/31/2017    Past  Medical History:  Diagnosis Date   Acid reflux    Amputated left leg (HCC)    bilateral BKA   Anemia    Arthritis    Axillary masses    Soft tissue - status post excision   Back pain    CHF (congestive heart failure) (HCC)    COVID-19 virus infection 04/06/2019   Depression    End-stage renal disease (HCC)    M/W/F dialysis   Essential hypertension    Headache    years ago   History of blood transfusion    History of cardiac catheterization    Normal coronary arteries October 2020   History of claustrophobia    History of pneumonia 2019   Hypoxia 04/03/2019   Memory loss    Mixed hyperlipidemia    Obesity    Pancreatitis    Peritoneal dialysis catheter in [facility])    Pneumonia due to COVID-19 virus 04/02/2019   Sleep apnea    Noncompliant with CPAP   Stroke (HCC)    mini stroke   Type 2 diabetes mellitus (HCC)     Past Surgical History:  Procedure Laterality Date   ABDOMINAL AORTOGRAM W/LOWER EXTREMITY N/A 04/30/2022   Procedure: ABDOMINAL AORTOGRAM W/LOWER EXTREMITY;  Surgeon: Magda Debby SAILOR, MD;  Location: [facility] INVASIVE CV LAB;  Service: Cardiovascular;  Laterality: N/A;   ABDOMINAL AORTOGRAM W/LOWER EXTREMITY N/A 07/21/2022   Procedure: ABDOMINAL AORTOGRAM W/LOWER EXTREMITY;  Surgeon: Serene Gaile ORN, MD;  Location: [facility] INVASIVE CV LAB;  Service: Cardiovascular;  Laterality: N/A;   ABDOMINAL HYSTERECTOMY     ACHILLES TENDON LENGTHENING  08/15/2022   Procedure: ACHILLES TENDON LENGTHENING;  Surgeon: Silva Juliene SAUNDERS, DPM;  Location: [facility] OR;  Service: Podiatry;;   AMPUTATION Right 05/29/2022   Procedure: RIGHT BELOW THE KNEE AMPUTATION;  Surgeon: Harden Jerona GAILS, MD;  Location: [facility] OR;  Service: Orthopedics;  Laterality: Right;   AMPUTATION Left 09/04/2022   Procedure: AMPUTATION FOOT, serial irrigation;  Surgeon: Joya Stabs, DPM;  Location: [facility] OR;  Service: Podiatry;  Laterality: Left;  Surgical team to do block   AMPUTATION Left 10/07/2022   Procedure: LEFT BELOW  KNEE AMPUTATION;  Surgeon: Harden Jerona GAILS, MD;  Location: [facility] OR;  Service: Orthopedics;  Laterality: Left;   AMPUTATION FINGER Left 09/29/2023   Procedure: AMPUTATION, FINGER;  Surgeon: Harden Jerona GAILS, MD;  Location: [facility] OR;  Service: Orthopedics;  Laterality: Left;  LEFT HAND LONG FINGER RAY AMPUTATION   AV FISTULA PLACEMENT Left 09/02/2017   Procedure: creation of left arm ARTERIOVENOUS (AV) FISTULA;  Surgeon: Serene Gaile ORN, MD;  Location: [facility] OR;  Service: Vascular;  Laterality: Left;   COLONOSCOPY  2008   Dr. Harvey: normal    COLONOSCOPY N/A 12/18/2016   Dr. Harvey: multiple tubular adenomas, internal hemorrhoids. Surveillance in 3 years    ESOPHAGEAL DILATION N/A 10/13/2015   Procedure: ESOPHAGEAL DILATION;  Surgeon: Claudis RAYMOND Rivet, MD;  Location: [facility];  Service: Endoscopy;  Laterality: N/A;   ESOPHAGOGASTRODUODENOSCOPY N/A 10/13/2015   Dr. Rivet: chronic gastritis on path, no H.pylori. Empiric dilation    ESOPHAGOGASTRODUODENOSCOPY N/A 12/18/2016   Dr. Harvey: mild gastritis. BRAVO study revealed uncontrolled GERD. Dysphagia secondary to uncontrolled reflux   FOOT SURGERY Bilateral    nerve     INCISION AND DRAINAGE OF WOUND Left 10/30/2023   Procedure: IRRIGATION AND DEBRIDEMENT WOUND;  Surgeon: Arlinda Buster, MD;  Location: [facility]  OR;  Service: Orthopedics;  Laterality: Left;  LEFT HAND WOUND   LEFT HEART CATH AND CORONARY ANGIOGRAPHY N/A 12/29/2018   Procedure: LEFT HEART CATH AND CORONARY ANGIOGRAPHY;  Surgeon: Dann Candyce RAMAN, MD;  Location: [facility] INVASIVE CV LAB;  Service: Cardiovascular;  Laterality: N/A;   LOWER EXTREMITY ANGIOGRAPHY Right 05/04/2022   Procedure: Lower Extremity Angiography;  Surgeon: Lanis Fonda BRAVO, MD;  Location: [facility] INVASIVE CV LAB;  Service: Cardiovascular;  Laterality: Right;   LUNG BIOPSY     MASS EXCISION Right 01/09/2013   Procedure: EXCISION OF NEOPLASM OF RIGHT  AXILLA  AND EXCISION OF NEOPLASM OF LEFT AXILLA;  Surgeon: Oneil DELENA Budge, MD;   Location: [facility] ORS;  Service: General;  Laterality: Right;  procedure end @ 08:23   MYRINGOTOMY WITH TUBE PLACEMENT Bilateral 04/28/2017   Procedure: BILATERAL MYRINGOTOMY WITH TUBE PLACEMENT;  Surgeon: Karis Clunes, MD;  Location: [facility] OR;  Service: ENT;  Laterality: Bilateral;   PERIPHERAL VASCULAR BALLOON ANGIOPLASTY Right 05/04/2022   Procedure: PERIPHERAL VASCULAR BALLOON ANGIOPLASTY;  Surgeon: Lanis Fonda BRAVO, MD;  Location: [facility] INVASIVE CV LAB;  Service: Cardiovascular;  Laterality: Right;  PT   PERIPHERAL VASCULAR INTERVENTION Right 05/04/2022   Procedure: PERIPHERAL VASCULAR INTERVENTION;  Surgeon: Lanis Fonda BRAVO, MD;  Location: [facility] INVASIVE CV LAB;  Service: Cardiovascular;  Laterality: Right;  SFA   PERIPHERAL VASCULAR INTERVENTION Left 07/21/2022   Procedure: PERIPHERAL VASCULAR INTERVENTION;  Surgeon: Serene Gaile ORN, MD;  Location: [facility] INVAS[facility] CV LAB;  Service: Cardiovascular;  Laterality: Left;   REVISION OF ARTERIOVENOUS GORETEX GRAFT Left 05/04/2018   Procedure: TRANSPOSITION OF CEPHALIC VEIN ARTERIOVENOUS FISTULA LEFT ARM;  Surgeon: Oris Krystal FALCON, MD;  Location: [facility] OR;  Service: Vascular;  Laterality: Left;   SAVORY DILATION N/A 12/18/2016   Procedure: SAVORY DILATION;  Surgeon: Harvey Margo CROME, MD;  Location: [facility];  Service: Endoscopy;  Laterality: N/A;   TRANSMETATARSAL AMPUTATION Left 08/15/2022   Procedure: TRANSMETATARSAL AMPUTATION;  Surgeon: Silva Juliene SAUNDERS, DPM;  Location: [facility] OR;  Service: Podiatry;  Laterality: Left;    Family History  Problem Relation Age of Onset   Hypertension Father    Hypercholesterolemia Father    Arthritis Father    Hypertension Sister    Hypercholesterolemia Sister    Breast cancer Sister    Hypertension Sister    Colon cancer Neg Hx    Colon polyps Neg Hx     Social History   Socioeconomic History   Marital status: Married    Spouse name: Brexley Cutshaw   Number of children: 2   Years of education: 12   Highest education level:  Associate degree: occupational, Scientist, product/process development, or vocational program  Occupational History   Occupation: retired   Tobacco Use   Smoking status: Never    Passive exposure: Never   Smokeless tobacco: Never   Tobacco comments:    Verified by Daughter, [name]  Vaping Use   Vaping status: Never Used  Substance and Sexual Activity   Alcohol use: No   Drug use: No   Sexual activity: Not Currently    Partners: Male  Other Topics Concern   Not on file  Social History Narrative   Lives alone with husband    Right handed   Caffeine-1/2 daily   Social Drivers of Health   Financial Resource Strain: Low Risk  (05/03/2023)   Overall Financial Resource Strain (CARDIA)    Difficulty of Paying Living Expenses: Not hard at all  Food Insecurity: Patient Declined (  11/11/2023)   Hunger Vital Sign    Worried About Running Out of Food in the Last Year: Patient declined    Ran Out of Food in the Last Year: Patient declined  Transportation Needs: No Transportation Needs (11/08/2023)   PRAPARE - Administrator, Civil Service (Medical): No    Lack of Transportation (Non-Medical): No  Physical Activity: Inactive (05/03/2023)   Exercise Vital Sign    Days of Exercise per Week: 0 days    Minutes of Exercise per Session: 0 min  Stress: No Stress Concern Present (05/03/2023)   [employer] of Occupational Health - Occupational Stress Questionnaire    Feeling of Stress : Not at all  Social Connections: Moderately Isolated (10/29/2023)   Social Connection and Isolation Panel    Frequency of Communication with Friends and Family: More than three times a week    Frequency of Social Gatherings with Friends and Family: More than three times a week    Attends Religious Services: Never    Database administrator or Organizations: No    Attends Banker Meetings: Never    Marital Status: Married  Catering manager Violence: Not At Risk (11/08/2023)   Humiliation, Afraid, Rape, and  Kick questionnaire    Fear of Current or Ex-Partner: No    Emotionally Abused: No    Physically Abused: No    Sexually Abused: No     Review of System:   General: Negative for anorexia, weight loss, fever, chills, fatigue, weakness. Eyes: Negative for vision changes.  ENT: Negative for hoarseness, difficulty swallowing , nasal congestion. CV: Negative for chest pain, angina, palpitations, dyspnea on exertion, peripheral edema.  Respiratory: Negative for dyspnea at rest, dyspnea on exertion, cough, sputum, wheezing.  GI: See history of present illness. GU:  Negative for dysuria, hematuria, urinary incontinence, urinary frequency, nocturnal urination.  MS: pain in left hand.   Derm: Negative for rash or itching.  Neuro: Negative for weakness, abnormal sensation, seizure, frequent headaches, memory loss, confusion.  Psych: Negative for anxiety, depression, suicidal ideation, hallucinations.  Endo: Negative for unusual weight change.  Heme: Negative for bruising or bleeding. Allergy: Negative for rash or hives.      Physical Examination:   Vital signs in last 24 hours: Temp:  [98 F (36.7 C)-98.8 F (37.1 C)] 98.7 F (37.1 C) (08/21 0430) Pulse Rate:  [65-74] 71 (08/21 0430) Resp:  [11-18] 16 (08/20 2107) BP: (133-175)/(47-76) 149/62 (08/21 0430) SpO2:  [92 %-100 %] 99 % (08/21 0430) Weight:  [71.1 kg-77.1 kg] 77.1 kg (08/21 0437) Last BM Date : 11/09/23  General: well-nourished, well-developed in no acute distress. Eating breakfast. Head: Normocephalic, atraumatic.   Eyes: Conjunctiva pink, no icterus. Mouth: Oropharyngeal mucosa moist and pink , no lesions erythema or exudate. Neck: Supple without thyromegaly, masses, or lymphadenopathy.  Lungs: Clear to auscultation bilaterally.  Heart: Regular rate and rhythm, no murmurs rubs or gallops.  Abdomen: Bowel sounds are normal, nontender, nondistended, no hepatosplenomegaly or masses, no abdominal bruits or hernia , no rebound  or guarding.   Rectal: not performed Extremities: bilateral BKAs. Left hand braced/bandaged.  Neuro: Alert and oriented x 4 , grossly normal neurologically.  Skin: Warm and dry, no rash or jaundice.   Psych: Alert and cooperative, normal mood and affect.        Intake/Output from previous day: 08/20 0701 - 08/21 0700 In: 100 [IV Piggyback:100] Out: -  Intake/Output this shift: No intake/output data recorded.  Lab Results:  CBC Recent Labs    11/10/23 0955 11/11/23 0427  WBC 17.6* 16.3*  HGB 10.3* 8.7*  HCT 33.1* 28.9*  MCV 79.0* 80.3  PLT 381 352   BMET Recent Labs    11/10/23 0955 11/11/23 0427  NA 137 138  K 3.5 3.8  CL 97* 100  CO2 27 28  GLUCOSE 73 99  BUN 21 29*  CREATININE 2.81* 3.69*  CALCIUM  8.2* 7.9*   LFT Recent Labs    11/10/23 0955  BILITOT 0.4  ALKPHOS 102  AST 19  ALT <5  PROT 6.2*  ALBUMIN  2.4*    Lipase No results for input(s): LIPASE in the last 72 hours.  PT/INR Recent Labs    11/10/23 0955 11/11/23 0427  LABPROT 17.4* 17.3*  INR 1.3* 1.3*     Hepatitis Panel No results for input(s): HEPBSAG, HCVAB, HEPAIGM, HEPBIGM in the last 72 hours.   Imaging Studies:   CT Head Wo Contrast Result Date: 11/10/2023 EXAM: CT HEAD WITHOUT CONTRAST 11/10/2023 10:52:18 AM TECHNIQUE: CT of the head was performed without the administration of intravenous contrast. Automated exposure control, iterative reconstruction, and/or weight based adjustment of the mA/kV was utilized to reduce the radiation dose to as low as reasonably achievable. COMPARISON: MRI of the head dated 12/17/2022. CLINICAL HISTORY: Mental status change, unknown cause. Pt arrived via [facility] from dialysis, from home, pt was A\T\O this morning 0645 am before Tx and become lethargic and zoned out around 0800 this morning and was slow to respond, pt states she feels a little sleepy but able to answer EMSs questions. FINDINGS: BRAIN AND VENTRICLES: No acute hemorrhage.  Gray-white differentiation is preserved. No hydrocephalus. No extra-axial collection. No mass effect or midline shift. Chronic encephalomalacia changes present within the left parietal and occipital lobes. Mild periventricular white matter disease. ORBITS: No acute abnormality. SINUSES: No acute abnormality. SOFT TISSUES AND SKULL: No acute soft tissue abnormality. No skull fracture. Calcifications within the carotid siphons. IMPRESSION: 1. No acute intracranial abnormality. 2. Chronic encephalomalacia changes in the left parietal and occipital lobes. 3. Mild periventricular white matter disease. 4. Calcifications within the carotid siphons. Electronically signed by: Evalene Coho MD 11/10/2023 10:58 AM EDT RP Workstation: [id number]   DG Chest Portable 1 View Result Date: 11/10/2023 CLINICAL DATA:  Confusion.  Lethargy. EXAM: PORTABLE CHEST 1 VIEW COMPARISON:  10/27/2023. FINDINGS: Stable cardiomegaly. Aortic atherosclerosis. Similar streaky left basilar opacities, favored to reflect atelectasis. The right lung is clear. No sizable pleural effusion or pneumothorax. Degenerative changes of the right glenohumeral joint. No acute osseous abnormality. IMPRESSION: 1. Similar streaky left basilar opacities, favored to reflect atelectasis. 2. Stable cardiomegaly. Electronically Signed   By: Harrietta Sherry M.D.   On: 11/10/2023 10:10   CT Hand Left Wo Contrast Addendum Date: 10/29/2023 ADDENDUM REPORT: 10/29/2023 12:32 ADDENDUM: These results were called by telephone at the time of interpretation on 10/29/2023 at 12:08 pm to provider JOSEPH ZAMMIT , who verbally acknowledged these results. Electronically Signed   By: Harrietta Sherry M.D.   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 Blisters on the posterior left hand. Pain. EXAM: CT OF THE LEFT HAND WITHOUT CONTRAST TECHNIQUE: Multidetector CT imaging of the left hand was performed according  to the standard protocol. Multiplanar CT image reconstructions were also generated. RADIATION DOSE REDUCTION: This exam was performed according to the departmental dose-optimization program which includes automated exposure control, adjustment of the mA and/or kV according to  patient size and/or use of iterative reconstruction technique. COMPARISON:  Left hand radiograph dated 11/10/2023. FINDINGS: Bones/Joint/Cartilage No acute fracture or dislocation. Status post amputation of the third proximal, middle, and distal phalanges. No evidence of acute osteolysis or erosive changes. Mild degenerative arthropathy throughout the hand. Ligaments Ligaments are suboptimally evaluated by CT. Soft tissue / Muscles and Tendons Blistering along the dorsal hand. Marked subcutaneous edema along the dorsal hand extending to the underlying extensor digitorum tendons with numerous scattered foci of soft tissue gas. Surrounding fat stranding. No discrete loculated fluid collection. Peripheral vascular calcifications. IMPRESSION: 1. Blistering and marked subcutaneous edema with stranding along the dorsal hand extending to the underlying extensor digitorum tendons with numerous scattered foci of soft tissue gas. These findings are concerning for gas-forming soft tissue infection. No discrete loculated fluid collection. 2. No acute osseous abnormality. 3. Status post amputation of the third digit. Electronically Signed: By: Harrietta Sherry M.D. On: 10/29/2023 12:02   DG Hand Complete Left Result Date: 10/29/2023 CLINICAL DATA:  Pain. EXAM: LEFT HAND - COMPLETE 3+ VIEW COMPARISON:  None Available. FINDINGS: There is amputation of the middle finger at the level of metacarpophalangeal joint. No acute fracture or dislocation. No aggressive osseous lesion. 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 No periarticular osteopenia or bony  erosions. No radiopaque foreign bodies. There is diffuse soft tissue swelling over the dorsum of the hand. There are few foci of air within the soft tissue. However, underlying bones appear intact. No focal bone erosions. IMPRESSION: 1. No acute osseous abnormality of the left hand. 2. Amputation of middle finger at the level of metacarpophalangeal joint. 3. There is diffuse soft tissue swelling over the dorsum of the hand. There are few foci of air within the soft tissue. However, underlying bones appear intact. No focal bone erosions. Electronically Signed   By: Ree Molt M.D.   On: 10/29/2023 10:09   US  LT UPPER EXTREM LTD SOFT TISSUE NON VASCULAR Result Date: 10/28/2023 CLINICAL DATA:  Swelling. Amputation of the left long finger on 09/29/2023. EXAM: ULTRASOUND left UPPER EXTREMITY LIMITED TECHNIQUE: Ultrasound examination of the upper extremity soft tissues was performed in the area of clinical concern. COMPARISON:  None Available. FINDINGS: Targeted sonographic evaluation of the dorsal left hand corresponding to the area of clinical concern demonstrates diffuse subcutaneous edema and ill-defined fluid without discrete loculated fluid collection. Irregular small echogenic foci with areas of dirty shadowing in the subcutaneous soft tissues could reflect soft tissue air. These findings may be secondary to postsurgical change, however, soft tissue infection with cellulitis and possible soft tissue gas cannot be excluded. IMPRESSION: Targeted sonographic evaluation of the dorsal left hand corresponding to the area of clinical concern demonstrates diffuse subcutaneous edema and ill-defined fluid without discrete loculated fluid collection. Irregular small echogenic foci with areas of dirty shadowing in the subcutaneous soft tissues could reflect soft tissue air. These findings may be secondary to postsurgical change, however, soft tissue infection with cellulitis and possible soft tissue gas cannot be  excluded. Consider further evaluation with cross-sectional imaging such as contrast-enhanced CT or MRI. Electronically Signed   By: Harrietta Sherry M.D.   On: 10/28/2023 15:04   DG Chest Portable 1 View Result Date: 10/27/2023 CLINICAL DATA:  Shortness of breath. EXAM: PORTABLE CHEST 1 VIEW COMPARISON:  April 30, 2023. FINDINGS: Stable cardiomegaly. Right lung is clear. Minimal left basilar subsegmental atelectasis is noted. Bony thorax is unremarkable. IMPRESSION: Minimal left  basilar subsegmental atelectasis. Electronically Signed   By: Lynwood Landy Raddle M.D.   On: 10/27/2023 12:08  [4 week]  Assessment:   68 y/o female with complex PMH significant for GERD, pancreatitis, moderate aortic stenosis, new onset Afib on apixaban  with recent cardioversion 10/27/23, PAD (s/p R BKA in 05/2022, prior lithotripsy and stenting of the left superficial femoral and popliteal artery in 06/2022 and ultimately L BKA in 09/2022), HTN, HLD, Type 2 DM, bilateral adrenal adenoma (followed by Endocrinology), chronic HFpEF, aortic stenosis, OSA, ESRD (MWF), history of TIA's, history of recurrent chest pain  (cath in 12/2018 showing normal coronary arteries) recent hospitalization for left hand I&D, MSSA infection on antibiotics IV Ancef  with HD who presented from hemodialysis due to drowsiness, confusion.Patient admitted due to meeting SIRS criteria due to WBC (suspect UTI, hand abscess stable) and MS changes (likely related to hypoglycemia). GI consulted for black hemoccult positive stool.   Hemoccult positive stool: baseline Hgb in 10 range as outlined. Some drop from yesterday down to 8.7. She did not complete dialysis yesterday. Cannot rule out fluid shifts to account for change in Hgb. 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 She is hemoccult positive. She may have chronic occult GI bleeding but given chronicity of black stools with relatively stable Hgb, I am less concerned for significant UGI bleed.   Her  last EGD/colonoscopy 2019/2018. She is overdue for surveillance colonoscopy for adenomatous colon polyps. She has history of gastritis. She is also chronically anticoagulated. No longer on PPI.   Plan:   Trend Hgb for significant drops that may indicate more significant acute GI bleeding. Could consider endoscopy evaluation both colonoscopy/egd once acute illness stable. Given chronic anticoagulation may be best to complete while inpatient if she is able to hold Eliquis  (recent cardioversion in ED 10/27/23).  Due to her moderate stenosis, she may not be a candidate for endoscopic evaluation at [facility]. Pantoprazole  40mg  daily.  Check H/H this afternoon.   LOS: 0 days   We would like to thank you for the opportunity to participate in the care of Kathryn Beck.  Sonny RAMAN. Ezzard RIGGERS [facility] [phone] 8/21/202511:28 AM

## 2023-11-11 NOTE — Care Management Obs Status (Signed)
 MEDICARE OBSERVATION STATUS NOTIFICATION   Patient Details  Name: Kathryn Beck MRN: [id number] Date of Birth: 06/17/1955   Medicare Observation Status Notification Given:  Yes    Nena LITTIE Coffee, RN 11/11/2023, 2:16 PM

## 2023-11-11 NOTE — Evaluation (Signed)
 Occupational Therapy Evaluation Patient Details Name: Kathryn Beck MRN: [id number] DOB: 13-Feb-1956 Today's Date: 11/11/2023   History of Present Illness   Kathryn Beck is a 68 year old female with significant history of Atrial fib on apixaban , PAD  (s/p R BKA in 05/2022, prior lithotripsy and stenting of the left superficial femoral and popliteal artery in 06/2022 and ultimately L BKA in 09/2022), HTN, HLD, Type 2 DM, bilateral adrenal adenoma (followed by Endocrinology), chronic HFpEF, aortic stenosis, OSA, ESRD (MWF), history of TIA's, history of recurrent chest pain  (cath in 12/2018 showing normal coronary arteries) recent hospitalization for left hand I&D, MSSA infection on antibiotics IV Ancef  with HD... Presented from hemodialysis today for drowsiness, confusion. (per MD)     Clinical Impressions Pt agreeable to OT evaluation. Pt slow to respond and follow commands, but able to do so. L UE splinted without functional use at this time. Pt able to complete bed mobility with CGA today. Limited in ADL's due to L UE deficits and splint. Min to mod A for upper body and mod to max for lower body based on observation and clinical judgement today. Pt left seated at the EOB with call bell within reach. Pt educated to try and sit at the EOB for awhile to improve strength and balance. Pt will benefit from continued OT in the hospital and recommended venue below to increase strength, balance, and endurance for safe ADL's.        If plan is discharge home, recommend the following:   A lot of help with walking and/or transfers;A little help with bathing/dressing/bathroom;Assistance with cooking/housework;Assist for transportation;Help with stairs or ramp for entrance     Functional Status Assessment   Patient has had a recent decline in their functional status and demonstrates the ability to make significant improvements in function in a reasonable and predictable amount of time.      Equipment Recommendations   None recommended by OT             Precautions/Restrictions   Precautions Precautions: Fall Recall of Precautions/Restrictions: Intact Required Braces or Orthoses: Splint/Cast Splint/Cast: L hand Restrictions Weight Bearing Restrictions Per Provider Order: Yes LUE Weight Bearing Per Provider Order: Non weight bearing (per chart/nursing note)     Mobility Bed Mobility Overal bed mobility: Needs Assistance Bed Mobility: Supine to Sit     Supine to sit: Contact guard, HOB elevated     General bed mobility comments: Mild labored movement but not physical assist. Able to scoot to EOB and to R side by waddling back and forth without use of L UE.    Transfers                   General transfer comment: Non-ambulatory ; using hoyer lift since L hand issues.      Balance Overall balance assessment: Needs assistance Sitting-balance support: Feet unsupported, Single extremity supported Sitting balance-Leahy Scale: Fair Sitting balance - Comments: Fair/good seated EOB with RUE support                                   ADL either performed or assessed with clinical judgement   ADL Overall ADL's : Needs assistance/impaired;At baseline     Grooming: Minimal assistance;Sitting   Upper Body Bathing: Minimal assistance;Moderate assistance;Sitting   Lower Body Bathing: Minimal assistance;Moderate assistance;Sitting/lateral leans   Upper Body Dressing : Minimal assistance;Moderate assistance   Lower Body Dressing:  Minimal assistance;Moderate assistance;Sitting/lateral leans   Toilet Transfer: Moderate assistance;Maximal assistance;Transfer Regulatory affairs officer Details (indicate cue type and reason): Based on ability to scoot laterally in bed. Toileting- Clothing Manipulation and Hygiene: Moderate assistance;Sitting/lateral lean;Maximal assistance               Vision Baseline Vision/History: 1 Wears  glasses Ability to See in Adequate Light: 0 Adequate Patient Visual Report: No change from baseline Vision Assessment?: No apparent visual deficits;Wears glasses for reading     Perception Perception: Not tested       Praxis Praxis: Not tested       Pertinent Vitals/Pain Pain Assessment Pain Assessment: 0-10 Pain Score: 8  Pain Location: L hand Pain Descriptors / Indicators: Sharp Pain Intervention(s): Limited activity within patient's tolerance, Monitored during session     Extremity/Trunk Assessment Upper Extremity Assessment Upper Extremity Assessment: Generalized weakness;LUE deficits/detail (B UE shoulder strength limited to 3-/5 for shoulder flexion but full P/ROM for each movement; generally weak with L hand splint.) LUE Deficits / Details: L hand in splint/cast during session. LUE Coordination: decreased fine motor   Lower Extremity Assessment Lower Extremity Assessment: Defer to PT evaluation   Cervical / Trunk Assessment Cervical / Trunk Assessment: Kyphotic   Communication Communication Communication: No apparent difficulties   Cognition Arousal: Alert Behavior During Therapy: WFL for tasks assessed/performed Cognition: No apparent impairments (mild delay in responsiveness.)             OT - Cognition Comments: Some delay from command to action but followed commands and answered questions.                 Following commands: Impaired, Intact Following commands impaired: Follows one step commands with increased time     Cueing  General Comments   Cueing Techniques: Verbal cues;Visual cues                 Home Living Family/patient expects to be discharged to:: Private residence Living Arrangements: Spouse/significant other Available Help at Discharge: Family;Available PRN/intermittently Type of Home: House Home Access: Stairs to enter [employer] of Steps: 2 Entrance Stairs-Rails: None Home Layout: Two level;Able to  live on main level with bedroom/bathroom Alternate Level Stairs-Number of Steps: Flight   Bathroom Shower/Tub: Chief Strategy Officer: Standard Bathroom Accessibility: Yes How Accessible: Accessible via wheelchair Home Equipment: BSC/3in1;Wheelchair - Forensic psychologist (2 wheels);Cane - single point;Grab bars - tub/shower;Wheelchair - power;Hand held shower head;Other (comment);Hospital bed (hoyer lift)   Additional Comments: per chart      Prior Functioning/Environment Prior Level of Function : Needs assist       Physical Assist : Mobility (physical);ADLs (physical) Mobility (physical): Bed mobility;Transfers ADLs (physical): Bathing;Dressing;IADLs;Toileting;Grooming;Feeding Mobility Comments: Pt confirms prior to recent blistering on L hand she was independent with slide board transfers. Since then, she's required assist with transfers. Reports she does not have prosthesis due to healing issues and non-ambulatory at baseline, w/c main mode of transport. (per PT note) ADLs Comments: Today pt reports assist for all ADL's since L hand injury.    OT Problem List: Decreased strength;Decreased range of motion;Decreased activity tolerance;Impaired balance (sitting and/or standing);Pain   OT Treatment/Interventions: Therapeutic exercise;Self-care/ADL training;Therapeutic activities;Patient/family education;Balance training;DME and/or AE instruction      OT Goals(Current goals can be found in the care plan section)   Acute Rehab OT Goals Patient Stated Goal: improve function OT Goal Formulation: With patient Time For Goal Achievement: 11/25/23 Potential to Achieve Goals: Good   OT Frequency:  Min 2X/week                  AM-PAC OT 6 Clicks Daily Activity     Outcome Measure Help from another person eating meals?: A Little Help from another person taking care of personal grooming?: A Little Help from another person toileting, which includes using toliet,  bedpan, or urinal?: A Lot Help from another person bathing (including washing, rinsing, drying)?: A Lot Help from another person to put on and taking off regular upper body clothing?: A Lot Help from another person to put on and taking off regular lower body clothing?: A Lot 6 Click Score: 14   End of Session    Activity Tolerance: Patient tolerated treatment well Patient left: in bed;with call bell/phone within reach  OT Visit Diagnosis: Muscle weakness (generalized) (M62.81) Pain - Right/Left: Left Pain - part of body: Hand;Arm                Time: 9098-9086 OT Time Calculation (min): 12 min Charges:  OT General Charges $OT Visit: 1 Visit OT Evaluation $OT Eval Low Complexity: 1 Low  Alper Guilmette OT, MOT  [employer] 11/11/2023, 11:15 AM

## 2023-11-11 NOTE — Consult Note (Signed)
 WOC Nurse Consult Note: Reason for Consult: Left hand cellulitis.  Amputation left middle finger.  Drain in place to I & D site on left dorsal hand.  Dorsal hand wound bed Is dark and dry.  Wound type: infectious  Pressure Injury POA: NA Measurement: finger amputation site with 1 cm nonintact lesion  Dorsal hand was recently debrided due to infection and blistering, wound bed is dry.  Wound bed: dark, dry.  Drain in place Drainage (amount, consistency, odor) minimal serosanguinous   Periwound: edema and tenderness to left hand.  Dressing procedure/placement/frequency: Cleanse left hand with NS and pat dry. Apply silvadene  cream to open wounds and cover with nonadherent silicone mesh, Mepitel (LAWSON # [id number]).  Reapply each shift. Cover with gauze, kerlix and tape.  Will not follow at this time.  Please re-consult if needed.  Darice Cooley MSN, RN, FNP-BC CWON Wound, Ostomy, Continence Nurse Outpatient [facility] [phone] Work cell phone:  [phone]

## 2023-11-11 NOTE — Progress Notes (Signed)
 PROGRESS NOTE    Patient: Kathryn Beck                            PCP: Antonetta Rollene BRAVO, MD                    DOB: 05-27-1955            DOA: 11/10/2023 FMW:[id number]             DOS: 11/11/2023, 12:03 PM   LOS: 0 days   Date of Service: The patient was seen and examined on 11/11/2023  Subjective:   The patient was seen and examined this morning. Hemodynamically stable. No issues overnight.  Overnight dark stools noted, Hemoccult was positive - Patient denies having any frank bleeding per rectum Denies of having any chest pain shortness of breath, just generalized weaknesses  Brief Narrative:   Kathryn Beck is a 68 year old female with significant history of Atrial fib on apixaban , PAD  (s/p R BKA in 05/2022, prior lithotripsy and stenting of the left superficial femoral and popliteal artery in 06/2022 and ultimately L BKA in 09/2022), HTN, HLD, Type 2 DM, bilateral adrenal adenoma (followed by Endocrinology), chronic HFpEF, aortic stenosis, OSA, ESRD (MWF), history of TIA's, history of recurrent chest pain  (cath in 12/2018 showing normal coronary arteries) recent hospitalization for left hand I&D, MSSA infection on antibiotics IV Ancef  with HD... Presented from hemodialysis today for drowsiness, confusion. Upon applying the patient was found redly, lethargic.Kathryn Beck  8 AM, today's dialysis was started at 6:45, was cut short due to her change in status.   ED evaluation:  Blood pressure (!) 162/68, pulse 71, temperature 98.5 F (36.9 C), temperature source Oral, resp. rate 19, SpO2 100%.  CBG: 63, 68, 115 Labs: Chloride 97 creatinine 2.18, calcium  8.2 albumin  2.4 GFR 9, 18, CBC 16.7, 17.6, hemoglobin 10.3 UA: Amber, large hemoglobin, moderate leukocytes, moderate protein, many bacteria, WBC> 50   Requested patient to be admitted for altered mental status, confusion, likely complicated by UTI and hypoglycemia    Assessment & Plan:   Principal Problem:   SIRS (systemic  inflammatory response syndrome) (HCC) Active Problems:   Afib (HCC)   Abscess of left hand   Chronic diastolic (congestive) heart failure (HCC)   ESRD on hemodialysis (HCC)   CVA (cerebral vascular accident) (HCC)   Depression, major, single episode, moderate (HCC)   Hyperlipidemia   HTN (hypertension)   Chronic pain syndrome   Anemia of chronic disease   Diabetes mellitus (HCC)   Morbid obesity (HCC)   S/P BKA (below knee amputation), right (HCC)   Amputated left leg (HCC)   Acute confusion   GAD (generalized anxiety disorder)   Uncontrolled type 2 diabetes mellitus with hyperglycemia (HCC)     Assessment and Plan: * SIRS (systemic inflammatory response syndrome) (HCC) Hemodynamically stable Confusion during hemodialysis POA: Patient met SIRS criteria due to altered mental status, leukocytosis 17.6, -Sepsis was ruled out -UA positive for hgb, leukocytes, many bacteria, WBC > 500  - Discussed the case with infectious disease team at [facility] Agreed the patient was not septic source of infection not likely urine  - ID recommended continue current antibiotic of Ancef  with hemodialysis Mondays, Wednesdays and Fridays   - 2 g of IV Rocephin  was given in ED -Gentle IV fluid resuscitation (avoiding volume overload as patient is on hemodialysis) - Will follow-up with urine/blood cultures, - Lactic acid 1.7  (  Note patient is on hemodialysis-does not urinate normally likely colonized-no complaint of dysuria, fever, chills)  Acute on chronic anemia-anemia of chronic disease due to ESRD -with iron deficiency anemia -Drop in hemoglobin noted-no active signs of bleed, Hemoccult positive -Chronically anticoagulated with Eliquis , on hold now - Total iron low at 27, folate 17.3, B12 997    Latest Ref Rng & Units 11/11/2023    4:27 AM 11/10/2023    9:55 AM 11/05/2023    5:08 AM  CBC  WBC 4.0 - 10.5 K/uL 16.3  17.6  16.7   Hemoglobin 12.0 - 15.0 g/dL 8.7  89.6  89.7   Hematocrit 36.0 -  46.0 % 28.9  33.1  31.8   Platelets 150 - 400 K/uL 352  381  401    Consult GI for evaluation recommendation - Holding her home medication of Eliquis     Abscess of left hand - Stable, continue antibiotics - Home antibiotics Ancef  with hemodialysis TTS -Continue Rocephin  for now  Afib (HCC) - Heart rate controlled, on Eliquis  and metoprolol -holding Eliquis     Depression, major, single episode, moderate (HCC) - Review home medication, resume Zoloft , BuSpar , holding Cymbalta   CVA (cerebral vascular accident) (HCC) - Continue statins, holding Eliquis  -Neurocheck for confusion  ESRD on hemodialysis (HCC) - On hemodialysis MWF -Partial hemodialysis 11/11/2023 - Hemodialysis per nephrology  Chronic diastolic (congestive) heart failure (HCC) - Euvolemic, monitoring volume status: Dialysis Currently stable - Resuming home Demadex    Last echo 06/15/2023, EF 55%.  No LVH,. Left ventricular diastolic parameters are consistent with Grade I diastolic dysfunction (impaired relaxation). Elevated left ventricular end-diastolic pressure.   Chronic pain syndrome - Minimizing narcotics due to altered mental status  HTN (hypertension)- stable, continue Norvasc , metoprolol    Hyperlipidemia - Continue statins  GAD (generalized anxiety disorder) - As needed benzodiazepines, will monitor due to confusion  Acute confusion - Likely due to hypoglycemia, exacerbated by infection -Monitoring closely -Continue neurochecks Avoiding contributing sedative meds  Amputated left leg (HCC) - Status post bilateral BKA, monitoring closely No signs of infection  S/P BKA (below knee amputation), right (HCC) - No signs of infection at stump  Morbid obesity (HCC)  Height 5 5, weight 76.7 kg 7 days ago Will calculate BMI, Patient will benefit from outpatient follow-up with PCP for weight management  Diabetes mellitus (HCC) - Last A1c 6.7 -Hypoglycemic this a.m. CBG (last 3)  Recent Labs     11/10/23 2135 11/10/23 2243 11/11/23 0711  GLUCAP 63* 113* 92     - Holding home medication of insulin  glargine for now - Checking CBG q. ACHS, SSI coverage    ----------------------------------------------------------------------------------------------------------------------------------------------- Nutritional status:  The patient's BMI is: Body mass index is 28.29 kg/m. I agree with the assessment and plan as outlined  Nutrition Status:       ------------------------------------------------------------------------------------------------------------------- Cultures; Blood Cultures x 2 >> Urine Culture  >>> ------------------------------------------------------------------------------------------------------------------- DVT prophylaxis:  SCDs Start: 11/10/23 1243 apixaban  (ELIQUIS ) tablet 5 mg   Code Status:   Code Status: Full Code  Family Communication: No family member present at bedside-  -Advance care planning has been discussed.   Admission status:   Status is: Inpatient Needing to rule out sepsis, GI bleed, pending GI evaluation During hemodialysis   Disposition: From  - home             Planning for discharge in 1-2 days   Procedures:   No admission procedures for hospital encounter.   Antimicrobials:  Anti-infectives (From admission, onward)  Start     Dose/Rate Route Frequency Ordered Stop   11/12/23 1200  ceFAZolin  (ANCEF ) IVPB 2g/100 mL premix        2 g 200 mL/hr over 30 Minutes Intravenous Every M-W-F (Hemodialysis) 11/10/23 1537     11/10/23 2200  cefTRIAXone  (ROCEPHIN ) 2 g in sodium chloride  0.9 % 100 mL IVPB  Status:  Discontinued        2 g 200 mL/hr over 30 Minutes Intravenous Every 24 hours 11/10/23 1256 11/11/23 0738   11/10/23 1230  cefTRIAXone  (ROCEPHIN ) 1 g in sodium chloride  0.9 % 100 mL IVPB        1 g 200 mL/hr over 30 Minutes Intravenous  Once 11/10/23 1222 11/10/23 1400        Medication:   [START ON 11/12/2023]  amLODipine   5 mg Oral Daily   [START ON 11/12/2023] apixaban   5 mg Oral BID   busPIRone   5 mg Oral BID   [START ON 11/12/2023] calcitRIOL   1 mcg Oral Q M,W,F-HD   Chlorhexidine  Gluconate Cloth  6 each Topical Q0600   cinacalcet   90 mg Oral Once per day on Monday Wednesday Friday   ezetimibe   10 mg Oral Daily   insulin  aspart  0-5 Units Subcutaneous QHS   insulin  aspart  0-6 Units Subcutaneous TID WC   lactobacillus  1 g Oral TID WC   [START ON 11/13/2023] metoprolol  succinate  100 mg Oral Once per day on Sunday Tuesday Thursday Saturday   senna-docusate  1 tablet Oral BID   sertraline   50 mg Oral Daily   silver  sulfADIAZINE    Topical BID   sodium chloride  flush  3 mL Intravenous Q12H   sodium chloride  flush  3 mL Intravenous Q12H   sucroferric oxyhydroxide  1,000 mg Oral TID WC   sucroferric oxyhydroxide  500 mg Oral With snacks   torsemide   100 mg Oral Q T,Th,S,Su    acetaminophen  **OR** acetaminophen , hydrALAZINE , HYDROmorphone  (DILAUDID ) injection, ipratropium, levalbuterol , ondansetron  **OR** ondansetron  (ZOFRAN ) IV, oxyCODONE , sodium phosphate , traZODone    Objective:   Vitals:   11/10/23 2107 11/11/23 0056 11/11/23 0430 11/11/23 0437  BP: (!) 150/55 (!) 150/76 (!) 149/62   Pulse: 73 72 71   Resp: 16     Temp: 98.8 F (37.1 C) 98.4 F (36.9 C) 98.7 F (37.1 C)   TempSrc:  Oral Oral   SpO2: 96% 100% 99%   Weight: 71.1 kg   77.1 kg    Intake/Output Summary (Last 24 hours) at 11/11/2023 1203 Last data filed at 11/11/2023 9471 Gross per 24 hour  Intake 100 ml  Output --  Net 100 ml   Filed Weights   11/10/23 2107 11/11/23 0437  Weight: 71.1 kg 77.1 kg     Physical examination:   General:  AAO x 3,  cooperative, no distress;   HEENT:  Normocephalic, PERRL, otherwise with in Normal limits   Neuro:  CNII-XII intact. , normal motor and sensation, reflexes intact   Lungs:   Clear to auscultation BL, Respirations unlabored,  No wheezes / crackles  Cardio:    S1/S2,  RRR, No murmure, No Rubs or Gallops   Abdomen:  Soft, non-tender, bowel sounds active all four quadrants, no guarding or peritoneal signs.  Muscular  skeletal:  Right BKA Dorsal left hand wound with drain in place Limited exam -global generalized weaknesses - in bed, able to move all 4 extremities,   2+ pulses,  symmetric, No pitting edema  Skin:  Dry, warm to  touch, left hand/wrist wound with dressing in place  Wounds: Acute on chronic posterior hand/wrist wound -see picture  Media Information  Document Information  Photos    11/10/2023 21:18  Attached To:  Hospital Encounter on 11/10/23   ------------------------------------------------------------------------------------------------------------------------------------------    LABs:     Latest Ref Rng & Units 11/11/2023    4:27 AM 11/10/2023    9:55 AM 11/05/2023    5:08 AM  CBC  WBC 4.0 - 10.5 K/uL 16.3  17.6  16.7   Hemoglobin 12.0 - 15.0 g/dL 8.7  89.6  89.7   Hematocrit 36.0 - 46.0 % 28.9  33.1  31.8   Platelets 150 - 400 K/uL 352  381  401       Latest Ref Rng & Units 11/11/2023    4:27 AM 11/10/2023    9:55 AM 11/05/2023    5:08 AM  CMP  Glucose 70 - 99 mg/dL 99  73  884   BUN 8 - 23 mg/dL 29  21  56   Creatinine 0.44 - 1.00 mg/dL 6.30  7.18  4.81   Sodium 135 - 145 mmol/L 138  137  140   Potassium 3.5 - 5.1 mmol/L 3.8  3.5  4.5   Chloride 98 - 111 mmol/L 100  97  99   CO2 22 - 32 mmol/L 28  27  28    Calcium  8.9 - 10.3 mg/dL 7.9  8.2  8.7   Total Protein 6.5 - 8.1 g/dL  6.2    Total Bilirubin 0.0 - 1.2 mg/dL  0.4    Alkaline Phos 38 - 126 U/L  102    AST 15 - 41 U/L  19    ALT 0 - 44 U/L  <5         Micro Results Recent Results (from the past 240 hours)  Culture, blood (Routine X 2) w Reflex to ID Panel     Status: None (Preliminary result)   Collection Time: 11/10/23  1:01 PM   Specimen: BLOOD  Result Value Ref Range Status   Specimen Description BLOOD BLOOD RIGHT ARM  Final   Special Requests    Final    Blood Culture adequate volume BOTTLES DRAWN AEROBIC AND ANAEROBIC   Culture   Final    NO GROWTH < 24 HOURS Performed at [facility], [address] [place], [place] [address]    Report Status PENDING  Incomplete  Culture, blood (Routine X 2) w Reflex to ID Panel     Status: None (Preliminary result)   Collection Time: 11/10/23  1:01 PM   Specimen: BLOOD  Result Value Ref Range Status   Specimen Description BLOOD BLOOD RIGHT HAND  Final   Special Requests   Final    BOTTLES DRAWN AEROBIC ONLY Blood Culture results may not be optimal due to an inadequate volume of blood received in culture bottles   Culture   Final    NO GROWTH < 24 HOURS Performed at [facility], [address] [place], [place] [address]    Report Status PENDING  Incomplete    Radiology Reports No results found.  SIGNED: Adriana DELENA Grams, MD, FHM. FAAFP. Jolynn Pack - [facility] Time spent - 55 min.  In seeing, evaluating and examining the patient. Reviewing medical records, labs, drawn plan of care. [facility],  Pager (please use [email or web address] to page/ text) Please use Epic Secure Chat for non-urgent communication (7AM-7PM)  If 7PM-7AM, please contact night-coverage [email or web address], 11/11/2023, 12:03 PM

## 2023-11-11 NOTE — Plan of Care (Signed)
  Problem: Acute Rehab OT Goals (only OT should resolve) Goal: Pt. Will Perform Grooming Flowsheets (Taken 11/11/2023 1123) Pt Will Perform Grooming:  with modified independence  sitting Goal: Pt. Will Perform Upper Body Dressing Flowsheets (Taken 11/11/2023 1123) Pt Will Perform Upper Body Dressing:  sitting  with modified independence  with adaptive equipment Goal: Pt. Will Perform Lower Body Dressing Flowsheets (Taken 11/11/2023 1123) Pt Will Perform Lower Body Dressing:  with modified independence  sitting/lateral leans  with adaptive equipment Goal: Pt. Will Transfer To Toilet Flowsheets (Taken 11/11/2023 1123) Pt Will Transfer to Toilet:  with contact guard assist  with transfer board  anterior/posterior transfer Goal: Pt. Will Perform Toileting-Clothing Manipulation Flowsheets (Taken 11/11/2023 1123) Pt Will Perform Toileting - Clothing Manipulation and hygiene:  with contact guard assist  sitting/lateral leans Goal: Pt/Caregiver Will Perform Home Exercise Program Flowsheets (Taken 11/11/2023 1123) Pt/caregiver will Perform Home Exercise Program:  Increased strength  Increased ROM  Both right and left upper extremity  Independently  Janaiyah Blackard OT, MOT

## 2023-11-11 NOTE — Consult Note (Signed)
 [facility] Renal Consultation Note    Indication for Consultation:  Management of ESRD/hemodialysis; anemia, hypertension/volume and secondary hyperparathyroidism  HPI: Kathryn Beck is a 68 y.o. female with a PMH significant for ESRD on HD MWF at [facility]. She has a past medical history significant for newly diagnosed atrial fib (on apixaban ), PAD  (s/p R BKA in 05/2022, prior lithotripsy and stenting of the left superficial femoral and popliteal artery in 06/2022 and ultimately L BKA in 09/2022), HTN, HLD, T2DM, bilateral adrenal adenoma (followed by Endocrinology), chronic HFpEF, aortic stenosis, OSA, history of TIA's, history of recurrent chest pain  (cath in 12/2018 showing normal coronary arteries), and recent admission to [facility] for left hand MSSA infection who presented to [facility] ED via EMS on 11/10/23 from HD with confusion and lethargy.  In the ED, temp 98, Bp 162/68, HR 71, SpO2 100% on room air.  Labs notable for WBC 17.6, Hgb 10.3, Na 137, K 3.5, BUN 21, Cr 2.81, Ca 8.2, glucose 73, alb 2.4, INR 1.3, UA +bld, moderate leukocytes, 100 protein, >50 WBC, >50 RBC's.  Pt was admitted for possible UTI and hypoglycemia.  We were consulted to provide dialysis during her admission.  She did not complete her full HD session yesterday.  Family at the bedside this morning.  Pt is feeling much better.  She did have FOBT+ and is awaiting GI evaluation.  Past Medical History:  Diagnosis Date   Acid reflux    Amputated left leg (HCC)    bilateral BKA   Anemia    Arthritis    Axillary masses    Soft tissue - status post excision   Back pain    CHF (congestive heart failure) (HCC)    COVID-19 virus infection 04/06/2019   Depression    End-stage renal disease (HCC)    M/W/F dialysis   Essential hypertension    Headache    years ago   History of blood transfusion    History of cardiac catheterization    Normal coronary arteries October 2020   History of claustrophobia     History of pneumonia 2019   Hypoxia 04/03/2019   Memory loss    Mixed hyperlipidemia    Obesity    Pancreatitis    Peritoneal dialysis catheter in place [facility]    Pneumonia due to COVID-19 virus 04/02/2019   Sleep apnea    Noncompliant with CPAP   Stroke (HCC)    mini stroke   Type 2 diabetes mellitus (HCC)    Past Surgical History:  Procedure Laterality Date   ABDOMINAL AORTOGRAM W/LOWER EXTREMITY N/A 04/30/2022   Procedure: ABDOMINAL AORTOGRAM W/LOWER EXTREMITY;  Surgeon: Magda Debby SAILOR, MD;  [facility]: [facility] INVASIVE CV LAB;  Service: Cardiovascular;  Laterality: N/A;   ABDOMINAL AORTOGRAM W/LOWER EXTREMITY N/A 07/21/2022   Procedure: ABDOMINAL AORTOGRAM W/LOWER EXTREMITY;  Surgeon: Serene Gaile ORN, MD;  Location: [facility] INVASIVE CV LAB;  Service: Cardiovascular;  Laterality: N/A;   ABDOMINAL HYSTERECTOMY     ACHILLES TENDON LENGTHENING  08/15/2022   Procedure: ACHILLES TENDON LENGTHENING;  Surgeon: Silva Juliene SAUNDERS, DPM;  Location: [facility] OR;  Service: Podiatry;;   AMPUTATION Right 05/29/2022   Procedure: RIGHT BELOW THE KNEE AMPUTATION;  Surgeon: Harden Jerona GAILS, MD;  Location: [facility] OR;  Service: Orthopedics;  Laterality: Right;   AMPUTATION Left 09/04/2022   Procedure: AMPUTATION FOOT, serial irrigation;  Surgeon: Joya Stabs, DPM;  Location: [facility] OR;  Service: Podiatry;  Laterality: Left;  Surgical team to do block  AMPUTATION Left 10/07/2022   Procedure: LEFT BELOW KNEE AMPUTATION;  Surgeon: Harden Jerona GAILS, MD;  Location: [facility] OR;  Service: Orthopedics;  Laterality: Left;   AMPUTATION FINGER Left 09/29/2023   Procedure: AMPUTATION, FINGER;  Surgeon: Harden Jerona GAILS, MD;  Location: [facility] OR;  Service: Orthopedics;  Laterality: Left;  LEFT HAND LONG FINGER RAY AMPUTATION   AV FISTULA PLACEMENT Left 09/02/2017   Procedure: creation of left arm ARTERIOVENOUS (AV) FISTULA;  Surgeon: Serene Gaile ORN, MD;  Location: [facility] OR;  Service: Vascular;  Laterality: Left;   COLONOSCOPY  2008   Dr. Harvey: normal     COLONOSCOPY N/A 12/18/2016   Dr. Harvey: multiple tubular adenomas, internal hemorrhoids. Surveillance in 3 years    ESOPHAGEAL DILATION N/A 10/13/2015   Procedure: ESOPHAGEAL DILATION;  Surgeon: Claudis RAYMOND Rivet, MD;  Location: [facility];  Service: Endoscopy;  Laterality: N/A;   ESOPHAGOGASTRODUODENOSCOPY N/A 10/13/2015   Dr. Rivet: chronic gastritis on path, no H.pylori. Empiric dilation    ESOPHAGOGASTRODUODENOSCOPY N/A 12/18/2016   Dr. Harvey: mild gastritis. BRAVO study revealed uncontrolled GERD. Dysphagia secondary to uncontrolled reflux   FOOT SURGERY Bilateral    nerve     INCISION AND DRAINAGE OF WOUND Left 10/30/2023   Procedure: IRRIGATION AND DEBRIDEMENT WOUND;  Surgeon: Arlinda Buster, MD;  Location: [facility] OR;  Service: Orthopedics;  Laterality: Left;  LEFT HAND WOUND   LEFT HEART CATH AND CORONARY ANGIOGRAPHY N/A 12/29/2018   Procedure: LEFT HEART CATH AND CORONARY ANGIOGRAPHY;  Surgeon: Dann Candyce RAMAN, MD;  [facility] CV LAB;  Service: Cardiovascular;  Laterality: N/A;   LOWER EXTREMITY ANGIOGRAPHY Right 05/04/2022   Procedure: Lower Extremity Angiography;  Surgeon: Lanis Fonda BRAVO, MD;  Location: [facility] INVASIVE CV LAB;  Service: Cardiovascular;  Laterality: Right;   LUNG BIOPSY     MASS EXCISION Right 01/09/2013   Procedure: EXCISION OF NEOPLASM OF RIGHT  AXILLA  AND EXCISION OF NEOPLASM OF LEFT AXILLA;  Surgeon: Oneil DELENA Budge, MD;  Location: [facility] ORS;  Service: General;  Laterality: Right;  procedure end @ 08:23   MYRINGOTOMY WITH TUBE PLACEMENT Bilateral 04/28/2017   Procedure: BILATERAL MYRINGOTOMY WITH TUBE PLACEMENT;  Surgeon: Karis Clunes, MD;  Location: [facility] OR;  Service: ENT;  Laterality: Bilateral;   PERIPHERAL VASCULAR BALLOON ANGIOPLASTY Right 05/04/2022   Procedure: PERIPHERAL VASCULAR BALLOON ANGIOPLASTY;  Surgeon: Lanis Fonda BRAVO, MD;  Location: [facility] INVASIVE CV LAB;  Service: Cardiovascular;  Laterality: Right;  PT   PERIPHERAL VASCULAR INTERVENTION Right  05/04/2022   Procedure: PERIPHERAL VASCULAR INTERVENTION;  Surgeon: Lanis Fonda BRAVO, MD;  Location: [facility] INVASIVE CV LAB;  Service: Cardiovascular;  Laterality: Right;  SFA   PERIPHERAL VASCULAR INTERVENTION Left 07/21/2022   Procedure: PERIPHERAL VASCULAR INTERVENTION;  Surgeon: Serene Gaile ORN, MD;  [facility];  Service: Cardiovascular;  Laterality: Left;   REVISION OF ARTERIOVENOUS GORETEX GRAFT Left 05/04/2018   Procedure: TRANSPOSITION OF CEPHALIC VEIN ARTERIOVENOUS FISTULA LEFT ARM;  Surgeon: Oris Krystal FALCON, MD;  Location: [facility] OR;  Service: Vascular;  Laterality: Left;   SAVORY DILATION N/A 12/18/2016   Procedure: SAVORY DILATION;  Surgeon: Harvey Margo CROME, MD;  [facility];  Service: Endoscopy;  Laterality: N/A;   TRANSMETATARSAL AMPUTATION Left 08/15/2022   Procedure: TRANSMETATARSAL AMPUTATION;  Surgeon: Silva Juliene SAUNDERS, DPM;  Location: [facility] OR;  Service: Podiatry;  Laterality: Left;   Family History:   Family History  Problem Relation Age of Onset   Hypertension Father    Hypercholesterolemia  Father    Arthritis Father    Hypertension Sister    Hypercholesterolemia Sister    Breast cancer Sister    Hypertension Sister    Colon cancer Neg Hx    Colon polyps Neg Hx    Social History:  reports that she has never smoked. She has never been exposed to tobacco smoke. She has never used smokeless tobacco. She reports that she does not drink alcohol and does not use drugs. Allergies  Allergen Reactions   Ace Inhibitors Anaphylaxis and Swelling   Penicillins Itching and Swelling    Has tolerated cefazolin  on multiple occasions    Statins Other (See Comments)    Elevated LFT's   Albuterol  Swelling   Prior to Admission medications   Medication Sig Start Date End Date Taking? Authorizing Provider  acetaminophen  (TYLENOL ) 500 MG tablet Take 1,000 mg by mouth every 6 (six) hours as needed for mild pain (pain score 1-3).   Yes [provider]   amLODipine  (NORVASC ) 10 MG tablet Take 0.5 tablets (5 mg total) by mouth daily. 10/28/23  Yes Emokpae, Courage, MD  apixaban  (ELIQUIS ) 5 MG TABS tablet Take 1 tablet (5 mg total) by mouth 2 (two) times daily. For Afib 10/29/23  Yes Emokpae, Courage, MD  aspirin  81 MG chewable tablet Chew 2 tablets (162 mg total) by mouth daily. 09/16/23  Yes Bevely Doffing, FNP  B Complex-C-Folic Acid  (RENA-VITE RX) 1 MG TABS Take 1 tablet by mouth daily. 04/15/22  Yes [provider]  busPIRone  (BUSPAR ) 5 MG tablet TAKE ONE TABLET BY MOUTH TWICE DAILY 10/20/23  Yes Antonetta Rollene BRAVO, MD  calcitRIOL  (ROCALTROL ) 0.5 MCG capsule Take 2 capsules (1 mcg total) by mouth every Monday, Wednesday, and Friday with hemodialysis. 11/05/23  Yes Amin, Ankit C, MD  ceFAZolin  (ANCEF ) 2-4 GM/100ML-% IVPB Inject 100 mLs (2 g total) into the vein every Monday, Wednesday, and Friday with hemodialysis for 20 days. 11/05/23 11/25/23 Yes Amin, Ankit C, MD  cinacalcet  (SENSIPAR ) 30 MG tablet Take 3 tablets (90 mg total) by mouth 3 (three) times a week. Patient taking differently: Take 90 mg by mouth every Monday, Wednesday, and Friday. 09/09/22  Yes Sheikh, Omair Latif, DO  Darbepoetin Alfa  (ARANESP ) 40 MCG/0.4ML SOSY injection Inject 0.4 mLs (40 mcg total) into the skin every Friday at 6 PM. Patient taking differently: Inject 40 mcg into the vein every Friday at 6 PM. 11/05/23  Yes Amin, Ankit C, MD  DULoxetine  (CYMBALTA ) 60 MG capsule TAKE (1) CAPSULE BY MOUTH ONCE DAILY. Patient taking differently: Take 60 mg by mouth daily. TAKE (1) CAPSULE BY MOUTH ONCE DAILY. 04/05/23  Yes Antonetta Rollene BRAVO, MD  ezetimibe  (ZETIA ) 10 MG tablet Take 1 tablet (10 mg total) by mouth daily. 04/27/23  Yes Antonetta Rollene BRAVO, MD  insulin  glargine, 2 Unit Dial , (TOUJEO  MAX SOLOSTAR) 300 UNIT/ML Solostar Pen Inject 30 Units into the skin daily. May need to slowly increase the dose depending upon your blood sugar, follow-up with PCP 01/01/23  Yes Trixie File, MD  metoprolol  succinate (TOPROL -XL) 50 MG 24 hr tablet TAKE (1) TABLET BY MOUTH DAILY WITH FOOD *TAKE AFTER DIALYSIS* Patient taking differently: Take 50 mg by mouth every Tuesday, Thursday, Saturday, and Sunday. Non-dialysis days 01/05/23  Yes Antonetta Rollene BRAVO, MD  torsemide  (DEMADEX ) 100 MG tablet Take 1 tablet (100 mg total) by mouth every Tuesday, Thursday, Saturday, and Sunday. Patient taking differently: Take 100 mg by mouth every Tuesday, Thursday, Saturday, and Sunday. Non-dialysis days  10/28/23  Yes Pearlean Manus, MD  [name]  500 MG chewable tablet Chew 1-2 tablets (500-1,000 mg total) by mouth See admin instructions. Take 1000mg  (2 tablets) by mouth with meals and 500mg  (1 tablet) with snacks 08/23/23  Yes Antonetta Rollene BRAVO, MD  sertraline  (ZOLOFT ) 50 MG tablet TAKE ONE TABLET BY MOUTH ONCE DAILY Patient not taking: Reported on 11/10/2023 11/09/23   Antonetta Rollene BRAVO, MD  FLUoxetine (PROZAC) 10 MG capsule Take 10 mg by mouth daily.    05/28/11  [provider]  glipiZIDE  (GLUCOTROL ) 10 MG tablet Take 10 mg by mouth 2 (two) times daily before a meal.    05/28/11  [provider]   Current Facility-Administered Medications  Medication Dose Route Frequency Provider Last Rate Last Admin   acetaminophen  (TYLENOL ) tablet 650 mg  650 mg Oral Q6H PRN Shahmehdi, Seyed A, MD       Or   acetaminophen  (TYLENOL ) suppository 650 mg  650 mg Rectal Q6H PRN Shahmehdi, Adriana LABOR, MD       [START ON 11/12/2023] amLODipine  (NORVASC ) tablet 5 mg  5 mg Oral Daily Shahmehdi, Seyed A, MD       [START ON 11/12/2023] apixaban  (ELIQUIS ) tablet 5 mg  5 mg Oral BID Shahmehdi, Seyed A, MD       busPIRone  (BUSPAR ) tablet 5 mg  5 mg Oral BID Shahmehdi, Seyed A, MD   5 mg at 11/11/23 0849   [START ON 11/12/2023] calcitRIOL  (ROCALTROL ) capsule 1 mcg  1 mcg Oral Q M,W,F-HD Shahmehdi, Adriana LABOR, MD       [START ON 11/12/2023] ceFAZolin  (ANCEF ) IVPB 2g/100 mL premix  2 g Intravenous Q M,W,F-HD  Shahmehdi, Seyed A, MD       cinacalcet  (SENSIPAR ) tablet 90 mg  90 mg Oral Once per day on Monday Wednesday Friday Willette Adriana A, MD   90 mg at 11/10/23 1644   ezetimibe  (ZETIA ) tablet 10 mg  10 mg Oral Daily Shahmehdi, Seyed A, MD   10 mg at 11/11/23 0848   hydrALAZINE  (APRESOLINE ) injection 10 mg  10 mg Intravenous Q4H PRN Shahmehdi, Seyed A, MD       HYDROmorphone  (DILAUDID ) injection 0.5-1 mg  0.5-1 mg Intravenous Q2H PRN Shahmehdi, Seyed A, MD       insulin  aspart (novoLOG ) injection 0-5 Units  0-5 Units Subcutaneous QHS Shahmehdi, Seyed A, MD       insulin  aspart (novoLOG ) injection 0-6 Units  0-6 Units Subcutaneous TID WC Shahmehdi, Seyed A, MD       ipratropium (ATROVENT ) nebulizer solution 0.5 mg  0.5 mg Nebulization Q6H PRN Shahmehdi, Seyed A, MD       lactobacillus (FLORANEX/LACTINEX) granules 1 g  1 g Oral TID WC Shahmehdi, Seyed A, MD   1 g at 11/11/23 0848   levalbuterol  (XOPENEX ) nebulizer solution 0.63 mg  0.63 mg Nebulization Q6H PRN Willette Adriana LABOR, MD       [START ON 11/13/2023] metoprolol  succinate (TOPROL -XL) 24 hr tablet 100 mg  100 mg Oral Once per day on Sunday Tuesday Thursday Saturday Shahmehdi, Adriana LABOR, MD       ondansetron  (ZOFRAN ) tablet 4 mg  4 mg Oral Q6H PRN Shahmehdi, Seyed A, MD       Or   ondansetron  (ZOFRAN ) injection 4 mg  4 mg Intravenous Q6H PRN Shahmehdi, Seyed A, MD       oxyCODONE  (Oxy IR/ROXICODONE ) immediate release tablet 5 mg  5 mg Oral Q4H PRN Willette Adriana LABOR, MD  5 mg at 11/10/23 1952   senna-docusate (Senokot-S) tablet 1 tablet  1 tablet Oral BID Willette Jest A, MD       sertraline  (ZOLOFT ) tablet 50 mg  50 mg Oral Daily Shahmehdi, Seyed A, MD   50 mg at 11/11/23 0848   silver  sulfADIAZINE  (SILVADENE ) 1 % cream   Topical BID Shahmehdi, Seyed A, MD       sodium chloride  flush (NS) 0.9 % injection 3 mL  3 mL Intravenous Q12H Shahmehdi, Seyed A, MD   3 mL at 11/10/23 2150   sodium chloride  flush (NS) 0.9 % injection 3 mL  3 mL  Intravenous Q12H Shahmehdi, Seyed A, MD   3 mL at 11/11/23 0856   sodium phosphate  (FLEET) enema 1 enema  1 enema Rectal Once PRN Shahmehdi, Seyed A, MD       sucroferric oxyhydroxide ([name] ) chewable tablet 1,000 mg  1,000 mg Oral TID WC Shahmehdi, Seyed A, MD   1,000 mg at 11/10/23 1644   sucroferric oxyhydroxide ([name] ) chewable tablet 500 mg  500 mg Oral With snacks Willette Jest A, MD   500 mg at 11/10/23 2155   torsemide  (DEMADEX ) tablet 100 mg  100 mg Oral Q T,Th,S,Su Shahmehdi, Seyed A, MD   100 mg at 11/11/23 9150   traZODone  (DESYREL ) tablet 25 mg  25 mg Oral QHS PRN Willette Jest LABOR, MD       Facility-Administered Medications Ordered in Other Encounters  Medication Dose Route Frequency Provider Last Rate Last Admin   0.9 %  sodium chloride  infusion   Intravenous Continuous Lockamy, Randi L, NP-C 0 mL/hr at 10/07/22 1422 New Bag at 09/29/23 0813   Labs: Basic Metabolic Panel: Recent Labs  Lab 11/05/23 0508 11/10/23 0955 11/11/23 0427  NA 140 137 138  K 4.5 3.5 3.8  CL 99 97* 100  CO2 28 27 28   GLUCOSE 115* 73 99  BUN 56* 21 29*  CREATININE 5.18* 2.81* 3.69*  CALCIUM  8.7* 8.2* 7.9*  PHOS  --  1.7*  --    Liver Function Tests: Recent Labs  Lab 11/10/23 0955  AST 19  ALT <5  ALKPHOS 102  BILITOT 0.4  PROT 6.2*  ALBUMIN  2.4*   No results for input(s): LIPASE, AMYLASE in the last 168 hours. Recent Labs  Lab 11/10/23 0955  AMMONIA 17   CBC: Recent Labs  Lab 11/05/23 0508 11/10/23 0955 11/11/23 0427  WBC 16.7* 17.6* 16.3*  HGB 10.2* 10.3* 8.7*  HCT 31.8* 33.1* 28.9*  MCV 74.8* 79.0* 80.3  PLT 401* 381 352   Cardiac Enzymes: No results for input(s): CKTOTAL, CKMB, CKMBINDEX, TROPONINI in the last 168 hours. CBG: Recent Labs  Lab 11/10/23 1136 11/10/23 1618 11/10/23 2135 11/10/23 2243 11/11/23 0711  GLUCAP 115* 140* 63* 113* 92   Iron Studies: No results for input(s): IRON, TIBC, TRANSFERRIN, FERRITIN in the last 72  hours. Studies/Results: CT Head Wo Contrast Result Date: 11/10/2023 EXAM: CT HEAD WITHOUT CONTRAST 11/10/2023 10:52:18 AM TECHNIQUE: CT of the head was performed without the administration of intravenous contrast. Automated exposure control, iterative reconstruction, and/or weight based adjustment of the mA/kV was utilized to reduce the radiation dose to as low as reasonably achievable. COMPARISON: MRI of the head dated 12/17/2022. CLINICAL HISTORY: Mental status change, unknown cause. Pt arrived via [facility] from dialysis, from home, pt was A\T\O this morning 0645 am before Tx and become lethargic and zoned out around 0800 this morning and was slow to respond, pt states she  feels a little sleepy but able to answer EMSs questions. FINDINGS: BRAIN AND VENTRICLES: No acute hemorrhage. Gray-white differentiation is preserved. No hydrocephalus. No extra-axial collection. No mass effect or midline shift. Chronic encephalomalacia changes present within the left parietal and occipital lobes. Mild periventricular white matter disease. ORBITS: No acute abnormality. SINUSES: No acute abnormality. SOFT TISSUES AND SKULL: No acute soft tissue abnormality. No skull fracture. Calcifications within the carotid siphons. IMPRESSION: 1. No acute intracranial abnormality. 2. Chronic encephalomalacia changes in the left parietal and occipital lobes. 3. Mild periventricular white matter disease. 4. Calcifications within the carotid siphons. Electronically signed by: Evalene Coho MD 11/10/2023 10:58 AM EDT RP Workstation: [id number]   DG Chest Portable 1 View Result Date: 11/10/2023 CLINICAL DATA:  Confusion.  Lethargy. EXAM: PORTABLE CHEST 1 VIEW COMPARISON:  10/27/2023. FINDINGS: Stable cardiomegaly. Aortic atherosclerosis. Similar streaky left basilar opacities, favored to reflect atelectasis. The right lung is clear. No sizable pleural effusion or pneumothorax. Degenerative changes of the right glenohumeral joint. No acute  osseous abnormality. IMPRESSION: 1. Similar streaky left basilar opacities, favored to reflect atelectasis. 2. Stable cardiomegaly. Electronically Signed   By: Harrietta Sherry M.D.   On: 11/10/2023 10:10    ROS: Pertinent items are noted in HPI. Physical Exam: Vitals:   11/10/23 2107 11/11/23 0056 11/11/23 0430 11/11/23 0437  BP: (!) 150/55 (!) 150/76 (!) 149/62   Pulse: 73 72 71   Resp: 16     Temp: 98.8 F (37.1 C) 98.4 F (36.9 C) 98.7 F (37.1 C)   TempSrc:  Oral Oral   SpO2: 96% 100% 99%   Weight: 71.1 kg   77.1 kg      Weight change:   Intake/Output Summary (Last 24 hours) at 11/11/2023 9095 Last data filed at 11/11/2023 9471 Gross per 24 hour  Intake 100 ml  Output --  Net 100 ml   BP (!) 149/62 (BP Location: Right Arm)   Pulse 71   Temp 98.7 F (37.1 C) (Oral)   Resp 16   Wt 77.1 kg   SpO2 99%   BMI 28.29 kg/m  General appearance: alert, cooperative, and no distress Head: Normocephalic, without obvious abnormality, atraumatic Resp: clear to auscultation bilaterally Cardio: regular rate and rhythm, S1, S2 normal, no murmur, click, rub or gallop GI: soft, non-tender; bowel sounds normal; no masses,  no organomegaly Extremities: s/p bilateral BKA's, no edema, LUE AVF +T/B, Left hand wrapped in splint Dialysis Access: LUE AVF  Dialysis Orders: [facility] - MWF 3.5hrs BFR 400 DFR 500  2K/2.5Ca  EDW 73.5kg Heparin  bolus 1000 units Calcitriol  with HD - last dose 10/29/23 Sensipar  90mg  with HD - last dose 10/29/23 Micera 50mcg Q2wks - last dose 10/18/23 [name]  1000mg  TID with meals  Assessment/Plan:  AMS - likely related to hypoglycemia.  Markedly improved this morning.  UTI - per primary  ESRD -  continue with HD on MWF schedule while she remains an inpatient  Hypertension/volume  -  stable  Anemia  - Hgb dropped from 10 to 8.7, FOBT +.  GI to see today  Metabolic bone disease -   continue with home meds  Nutrition - renal diet, carb modified   DM type 2- per primary.  Low glucose and family reports decreased po intake since discharge [facility].  Adjust meds per primary.  Left hand MSSA infection - continue Ancef   P. Atrial fib with rvr - improved with metoprolol   Fairy RONAL Sellar, MD [facility], [facility] 11/11/2023, 9:04  AM

## 2023-11-12 ENCOUNTER — Encounter: Payer: Self-pay | Admitting: Radiology

## 2023-11-12 DIAGNOSIS — G4733 Obstructive sleep apnea (adult) (pediatric): Secondary | ICD-10-CM | POA: Diagnosis not present

## 2023-11-12 DIAGNOSIS — D500 Iron deficiency anemia secondary to blood loss (chronic): Secondary | ICD-10-CM | POA: Diagnosis not present

## 2023-11-12 DIAGNOSIS — I120 Hypertensive chronic kidney disease with stage 5 chronic kidney disease or end stage renal disease: Secondary | ICD-10-CM | POA: Diagnosis not present

## 2023-11-12 DIAGNOSIS — N186 End stage renal disease: Secondary | ICD-10-CM | POA: Diagnosis not present

## 2023-11-12 DIAGNOSIS — T8131XA Disruption of external operation (surgical) wound, not elsewhere classified, initial encounter: Secondary | ICD-10-CM | POA: Diagnosis not present

## 2023-11-12 DIAGNOSIS — Z992 Dependence on renal dialysis: Secondary | ICD-10-CM | POA: Diagnosis not present

## 2023-11-12 DIAGNOSIS — D72829 Elevated white blood cell count, unspecified: Secondary | ICD-10-CM | POA: Diagnosis not present

## 2023-11-12 DIAGNOSIS — L02512 Cutaneous abscess of left hand: Secondary | ICD-10-CM | POA: Diagnosis not present

## 2023-11-12 DIAGNOSIS — B9561 Methicillin susceptible Staphylococcus aureus infection as the cause of diseases classified elsewhere: Secondary | ICD-10-CM | POA: Diagnosis not present

## 2023-11-12 DIAGNOSIS — N3001 Acute cystitis with hematuria: Secondary | ICD-10-CM | POA: Diagnosis not present

## 2023-11-12 DIAGNOSIS — R6510 Systemic inflammatory response syndrome (SIRS) of non-infectious origin without acute organ dysfunction: Secondary | ICD-10-CM | POA: Diagnosis not present

## 2023-11-12 DIAGNOSIS — R8290 Unspecified abnormal findings in urine: Secondary | ICD-10-CM | POA: Diagnosis not present

## 2023-11-12 DIAGNOSIS — N390 Urinary tract infection, site not specified: Secondary | ICD-10-CM | POA: Diagnosis not present

## 2023-11-12 DIAGNOSIS — S61402A Unspecified open wound of left hand, initial encounter: Secondary | ICD-10-CM | POA: Diagnosis not present

## 2023-11-12 LAB — CBC
HCT: 28.2 % — ABNORMAL LOW (ref 36.0–46.0)
Hemoglobin: 8.5 g/dL — ABNORMAL LOW (ref 12.0–15.0)
MCH: 24.1 pg — ABNORMAL LOW (ref 26.0–34.0)
MCHC: 30.1 g/dL (ref 30.0–36.0)
MCV: 80.1 fL (ref 80.0–100.0)
Platelets: 404 K/uL — ABNORMAL HIGH (ref 150–400)
RBC: 3.52 MIL/uL — ABNORMAL LOW (ref 3.87–5.11)
RDW: 20.1 % — ABNORMAL HIGH (ref 11.5–15.5)
WBC: 14.9 K/uL — ABNORMAL HIGH (ref 4.0–10.5)
nRBC: 0.3 % — ABNORMAL HIGH (ref 0.0–0.2)

## 2023-11-12 LAB — BASIC METABOLIC PANEL WITH GFR
Anion gap: 10 (ref 5–15)
BUN: 38 mg/dL — ABNORMAL HIGH (ref 8–23)
CO2: 27 mmol/L (ref 22–32)
Calcium: 8 mg/dL — ABNORMAL LOW (ref 8.9–10.3)
Chloride: 101 mmol/L (ref 98–111)
Creatinine, Ser: 4.68 mg/dL — ABNORMAL HIGH (ref 0.44–1.00)
GFR, Estimated: 10 mL/min — ABNORMAL LOW (ref >60)
Glucose, Bld: 111 mg/dL — ABNORMAL HIGH (ref 70–99)
Potassium: 4.1 mmol/L (ref 3.5–5.1)
Sodium: 138 mmol/L (ref 135–145)

## 2023-11-12 LAB — HEMOGLOBIN AND HEMATOCRIT, BLOOD
HCT: 29.8 % — ABNORMAL LOW (ref 36.0–46.0)
Hemoglobin: 9.3 g/dL — ABNORMAL LOW (ref 12.0–15.0)

## 2023-11-12 LAB — GLUCOSE, CAPILLARY
Glucose-Capillary: 119 mg/dL — ABNORMAL HIGH (ref 70–99)
Glucose-Capillary: 143 mg/dL — ABNORMAL HIGH (ref 70–99)
Glucose-Capillary: 187 mg/dL — ABNORMAL HIGH (ref 70–99)

## 2023-11-12 MED ORDER — CEFAZOLIN SODIUM-DEXTROSE 2-4 GM/100ML-% IV SOLN: 2.0000 g | INTRAVENOUS | Status: AC

## 2023-11-12 MED ORDER — INSULIN ASPART 100 UNIT/ML IJ SOLN: 0.0000 [IU] | Freq: Three times a day (TID) | INTRAMUSCULAR | 11 refills | Status: DC

## 2023-11-12 MED ORDER — CHLORHEXIDINE GLUCONATE CLOTH 2 % EX PADS
6.0000 | MEDICATED_PAD | Freq: Every day | CUTANEOUS | Status: DC
  Administered 2023-11-13: 6 via TOPICAL

## 2023-11-12 MED ORDER — CALCITRIOL 0.25 MCG PO CAPS
ORAL_CAPSULE | ORAL | Status: AC
  Filled 2023-11-12: qty 4

## 2023-11-12 MED ORDER — CHLORHEXIDINE GLUCONATE CLOTH 2 % EX PADS
6.0000 | MEDICATED_PAD | Freq: Every day | CUTANEOUS | Status: DC
  Administered 2023-11-12 – 2023-11-13 (×2): 6 via TOPICAL

## 2023-11-12 MED ORDER — CEFAZOLIN SODIUM-DEXTROSE 2-4 GM/100ML-% IV SOLN
2.0000 g | INTRAVENOUS | Status: DC
  Administered 2023-11-12: 2 g via INTRAVENOUS

## 2023-11-12 NOTE — Progress Notes (Signed)
 D/c orders noted. Contacted [facility] to inform of pt d/c and expected arrival Monday. D/c summary and last note have been faxed.   Verenise Moulin Dialysis Navigator

## 2023-11-12 NOTE — Progress Notes (Signed)
 PROGRESS NOTE    Patient: Kathryn Beck                            PCP: Antonetta Rollene BRAVO, MD                    DOB: 02/12/1956            DOA: 11/10/2023 FMW:[id number]             DOS: 11/12/2023, 12:07 PM   LOS: 0 days   Date of Service: The patient was seen and examined on 11/12/2023  Subjective:   The patient was seen and examined this morning, stable in no acute distress - Mild drop in hemoglobin, reporting no further melena, or rectal bleed  Discussed with patient gastroenterologist does not recommend any endoscopy at this point due to her cardiovascular issues Continue to hold Eliquis   Brief Narrative:   Kathryn Beck is a 68 year old female with significant history of Atrial fib on apixaban , PAD  (s/p R BKA in 05/2022, prior lithotripsy and stenting of the left superficial femoral and popliteal artery in 06/2022 and ultimately L BKA in 09/2022), HTN, HLD, Type 2 DM, bilateral adrenal adenoma (followed by Endocrinology), chronic HFpEF, aortic stenosis, OSA, ESRD (MWF), history of TIA's, history of recurrent chest pain  (cath in 12/2018 showing normal coronary arteries) recent hospitalization for left hand I&D, MSSA infection on antibiotics IV Ancef  with HD... Presented from hemodialysis today for drowsiness, confusion. Upon applying the patient was found redly, lethargic.[name]  8 AM, today's dialysis was started at 6:45, was cut short due to her change in status.   ED evaluation:  Blood pressure (!) 162/68, pulse 71, temperature 98.5 F (36.9 C), temperature source Oral, resp. rate 19, SpO2 100%.  CBG: 63, 68, 115 Labs: Chloride 97 creatinine 2.18, calcium  8.2 albumin  2.4 GFR 9, 18, CBC 16.7, 17.6, hemoglobin 10.3 UA: Amber, large hemoglobin, moderate leukocytes, moderate protein, many bacteria, WBC> 50   Requested patient to be admitted for altered mental status, confusion, likely complicated by UTI and hypoglycemia    Assessment & Plan:   Principal Problem:   SIRS  (systemic inflammatory response syndrome) (HCC) Active Problems:   Afib (HCC)   Abscess of left hand   Chronic diastolic (congestive) heart failure (HCC)   ESRD on hemodialysis (HCC)   CVA (cerebral vascular accident) (HCC)   Depression, major, single episode, moderate (HCC)   Hyperlipidemia   HTN (hypertension)   Chronic pain syndrome   Anemia of chronic disease   Diabetes mellitus (HCC)   Morbid obesity (HCC)   S/P BKA (below knee amputation), right (HCC)   Amputated left leg (HCC)   Acute confusion   GAD (generalized anxiety disorder)   Uncontrolled type 2 diabetes mellitus with hyperglycemia (HCC)     Assessment and Plan: * SIRS (systemic inflammatory response syndrome) (HCC) Patient remains hemodynamically stable -Sepsis ruled out  Confusion during hemodialysis POA: Patient met SIRS criteria due to altered mental status, leukocytosis 17.6, -UA positive for hgb, leukocytes, many bacteria, WBC > 500  - Discussed the case with infectious disease team at [facility] Agreed the patient was not septic source of infection not likely urine  - ID recommended continue current antibiotic of Ancef  with hemodialysis Mondays, Wednesdays and Fridays   -Discontinue Rocephin  - S/p gentle IV fluid hydration - Lactic acid 1.7  (Note patient is on hemodialysis-does not urinate normally likely colonized-no complaint of  dysuria, fever, chills)  Blood cultures/urine cultures --  no growth to date  Acute on chronic anemia-anemia of chronic disease due to ESRD -with iron deficiency anemia Hemoccult positive - Monitoring H&H, mild drop noted  -Chronically anticoagulated with Eliquis , on hold now  - Total iron low at 27, folate 17.3, B12 997    Latest Ref Rng & Units 11/12/2023    4:14 AM 11/11/2023   12:41 PM 11/11/2023    4:27 AM  CBC  WBC 4.0 - 10.5 K/uL 14.9   16.3   Hemoglobin 12.0 - 15.0 g/dL 8.5  89.9  8.7   Hematocrit 36.0 - 46.0 % 28.2  32.5  28.9   Platelets 150 - 400 K/uL 404    352    -Discussed with gastroenterologist they do not recommend any upper or lower endoscopy at this point due to increased risk secondary to patient's cardiovascular history  Repeating H&H today postdialysis   Abscess of left hand - Stable, continue antibiotics - Home antibiotics Ancef  with hemodialysis TTS -Continue Rocephin  for now  Afib (HCC) - Heart rate controlled, on Eliquis  and metoprolol -holding Eliquis     Depression, major, single episode, moderate (HCC) - Review home medication, resume Zoloft , BuSpar , holding Cymbalta   CVA (cerebral vascular accident) (HCC) - Continue statins, holding Eliquis  -Neurocheck for confusion  ESRD on hemodialysis (HCC) - On hemodialysis MWF -Partial hemodialysis 11/11/2023-hemodialysis today 11/12/2023 - Hemodialysis per nephrology  Chronic diastolic (congestive) heart failure (HCC) - Euvolemic, monitoring volume status: Dialysis Currently stable - Resuming home Demadex    Last echo 06/15/2023, EF 55%.  No LVH,. Left ventricular diastolic parameters are consistent with Grade I diastolic dysfunction (impaired relaxation). Elevated left ventricular end-diastolic pressure.   Chronic pain syndrome - Minimizing narcotics due to altered mental status  HTN (hypertension)- stable, continue Norvasc , metoprolol    Hyperlipidemia - Continue statins  GAD (generalized anxiety disorder) - As needed benzodiazepines, will monitor due to confusion  Acute confusion - Likely due to hypoglycemia, exacerbated by infection -Monitoring closely -Continue neurochecks Avoiding contributing sedative meds  Amputated left leg (HCC) - Status post bilateral BKA, monitoring closely No signs of infection  S/P BKA (below knee amputation), right (HCC) - No signs of infection at stump  Morbid obesity (HCC)  Height 5 5, weight 76.7 kg 7 days ago Will calculate BMI, Patient will benefit from outpatient follow-up with PCP for weight management  Diabetes  mellitus (HCC) - Last A1c 6.7 -Hypoglycemic this a.m. CBG (last 3)  Recent Labs    11/11/23 1610 11/11/23 2136 11/12/23 0744  GLUCAP 117* 138* 119*     - Holding home medication of insulin  glargine for now - Checking CBG q. ACHS, SSI coverage    ----------------------------------------------------------------------------------------------------------------------------------------------- Nutritional status:  The patient's BMI is: Body mass index is 25.68 kg/m. I agree with the assessment and plan as outlined  Nutrition Status:       ------------------------------------------------------------------------------------------------------------------- Cultures; Blood Cultures x 2 >> no growth to date Urine Culture  >>> ------------------------------------------------------------------------------------------------------------------- DVT prophylaxis:  SCDs Start: 11/10/23 1243   Code Status:   Code Status: Full Code  Family Communication: No family member present at bedside-  -Advance care planning has been discussed.   Admission status:   Status is: Inpatient Needing to rule out sepsis, GI bleed, pending GI evaluation During hemodialysis   Disposition: From  - home             Planning for discharge in 1-2 days   Procedures:   No admission procedures for hospital  encounter.   Antimicrobials:  Anti-infectives (From admission, onward)    Start     Dose/Rate Route Frequency Ordered Stop   11/12/23 1200  ceFAZolin  (ANCEF ) IVPB 2g/100 mL premix        2 g 200 mL/hr over 30 Minutes Intravenous Every M-W-F (Hemodialysis) 11/10/23 1537     11/12/23 1200  ceFAZolin  (ANCEF ) IVPB 2g/100 mL premix        2 g 200 mL/hr over 30 Minutes Intravenous Every M-W-F (Hemodialysis) 11/12/23 1024     11/12/23 0000  ceFAZolin  (ANCEF ) 2-4 GM/100ML-% IVPB        2 g Intravenous Every M-W-F (Hemodialysis) 11/12/23 0812 11/24/23 2359   11/10/23 2200  cefTRIAXone  (ROCEPHIN ) 2 g in  sodium chloride  0.9 % 100 mL IVPB  Status:  Discontinued        2 g 200 mL/hr over 30 Minutes Intravenous Every 24 hours 11/10/23 1256 11/11/23 0738   11/10/23 1230  cefTRIAXone  (ROCEPHIN ) 1 g in sodium chloride  0.9 % 100 mL IVPB        1 g 200 mL/hr over 30 Minutes Intravenous  Once 11/10/23 1222 11/10/23 1400        Medication:   amLODipine   5 mg Oral Daily   busPIRone   5 mg Oral BID   calcitRIOL   1 mcg Oral Q M,W,F-HD   Chlorhexidine  Gluconate Cloth  6 each Topical Q0600   cinacalcet   90 mg Oral Once per day on Monday Wednesday Friday   ezetimibe   10 mg Oral Daily   insulin  aspart  0-5 Units Subcutaneous QHS   insulin  aspart  0-6 Units Subcutaneous TID WC   lactobacillus  1 g Oral TID WC   [START ON 11/13/2023] metoprolol  succinate  100 mg Oral Once per day on Sunday Tuesday Thursday Saturday   pantoprazole   40 mg Oral QAC breakfast   senna-docusate  1 tablet Oral BID   sertraline   50 mg Oral Daily   silver  sulfADIAZINE    Topical BID   sodium chloride  flush  3 mL Intravenous Q12H   sodium chloride  flush  3 mL Intravenous Q12H   torsemide   100 mg Oral Q T,Th,S,Su    acetaminophen  **OR** acetaminophen , hydrALAZINE , HYDROmorphone  (DILAUDID ) injection, ipratropium, levalbuterol , ondansetron  **OR** ondansetron  (ZOFRAN ) IV, oxyCODONE , traZODone    Objective:   Vitals:   11/12/23 1000 11/12/23 1030 11/12/23 1100 11/12/23 1130  BP: (!) 173/64 (!) 167/58 (!) 150/60 (!) 156/52  Pulse: 71 71 70 69  Resp: 20 18 18 18   Temp:      TempSrc:      SpO2:      Weight:        Intake/Output Summary (Last 24 hours) at 11/12/2023 1207 Last data filed at 11/12/2023 0931 Gross per 24 hour  Intake 120 ml  Output --  Net 120 ml   Filed Weights   11/11/23 0437 11/12/23 0457 11/12/23 0946  Weight: 77.1 kg 69.9 kg 70 kg     Physical examination:    General:  AAO x 3,  cooperative, no distress;   HEENT:  Normocephalic, PERRL, otherwise with in Normal limits   Neuro:  CNII-XII intact.  , normal motor and sensation, reflexes intact   Lungs:   Clear to auscultation BL, Respirations unlabored,  No wheezes / crackles  Cardio:    S1/S2, RRR, No murmure, No Rubs or Gallops   Abdomen:  Soft, non-tender, bowel sounds active all four quadrants, no guarding or peritoneal signs.  Muscular  skeletal:  Bilateral BKA  Limited exam -global generalized weaknesses - in bed, able to move all 4 extremities,   2+ pulses,  symmetric, No pitting edema  Skin:  Dry, warm to touch, negative for any Rashes,    Wounds: Acute on chronic posterior hand/wrist wound -see picture  Media Information  Document Information  Photos    11/10/2023 21:18  Attached To:  Hospital Encounter on 11/10/23   ------------------------------------------------------------------------------------------------------------------------------------------    LABs:     Latest Ref Rng & Units 11/12/2023    4:14 AM 11/11/2023   12:41 PM 11/11/2023    4:27 AM  CBC  WBC 4.0 - 10.5 K/uL 14.9   16.3   Hemoglobin 12.0 - 15.0 g/dL 8.5  89.9  8.7   Hematocrit 36.0 - 46.0 % 28.2  32.5  28.9   Platelets 150 - 400 K/uL 404   352       Latest Ref Rng & Units 11/12/2023    4:14 AM 11/11/2023    4:27 AM 11/10/2023    9:55 AM  CMP  Glucose 70 - 99 mg/dL 888  99  73   BUN 8 - 23 mg/dL 38  29  21   Creatinine 0.44 - 1.00 mg/dL 5.31  6.30  7.18   Sodium 135 - 145 mmol/L 138  138  137   Potassium 3.5 - 5.1 mmol/L 4.1  3.8  3.5   Chloride 98 - 111 mmol/L 101  100  97   CO2 22 - 32 mmol/L 27  28  27    Calcium  8.9 - 10.3 mg/dL 8.0  7.9  8.2   Total Protein 6.5 - 8.1 g/dL   6.2   Total Bilirubin 0.0 - 1.2 mg/dL   0.4   Alkaline Phos 38 - 126 U/L   102   AST 15 - 41 U/L   19   ALT 0 - 44 U/L   <5        Micro Results Recent Results (from the past 240 hours)  Urine Culture     Status: None   Collection Time: 11/10/23 11:01 AM   Specimen: In/Out Cath Urine  Result Value Ref Range Status   Specimen Description   Final     IN/OUT CATH URINE Performed at [facility], [address] [place], [place] [address]    Special Requests   Final    NONE Performed at [facility], [address] [place], [place] [address]    Culture   Final    NO GROWTH Performed at [facility], [address] [place], [place] [address]    Report Status 11/11/2023 FINAL  Final  Culture, blood (Routine X 2) w Reflex to ID Panel     Status: None (Preliminary result)   Collection Time: 11/10/23  1:01 PM   Specimen: BLOOD  Result Value Ref Range Status   Specimen Description BLOOD BLOOD RIGHT ARM  Final   Special Requests   Final    Blood Culture adequate volume BOTTLES DRAWN AEROBIC AND ANAEROBIC   Culture   Final    NO GROWTH 2 DAYS Performed at [facility], [address] [place], [place] [address]    Report Status PENDING  Incomplete  Culture, blood (Routine X 2) w Reflex to ID Panel     Status: None (Preliminary result)   Collection Time: 11/10/23  1:01 PM   Specimen: BLOOD  Result Value Ref Range Status   Specimen Description BLOOD BLOOD RIGHT HAND  Final   Special  Requests   Final    BOTTLES DRAWN AEROBIC ONLY Blood Culture results may not be optimal due to an inadequate volume of blood received in culture bottles   Culture   Final    NO GROWTH 2 DAYS Performed at [facility], [address], [place], [place] [address]    Report Status PENDING  Incomplete    Radiology Reports No results found.  SIGNED: Adriana DELENA Grams, MD, FHM. FAAFP. Jolynn Pack - [facility] hospitalist Time spent - 55 min.  In seeing, evaluating and examining the patient. Reviewing medical records, labs, drawn plan of care. [facility],  Pager (please use [email or web address] to page/ text) Please use Epic Secure Chat for non-urgent communication (7AM-7PM)  If 7PM-7AM, please contact night-coverage [email or web address], 11/12/2023, 12:07 PM

## 2023-11-12 NOTE — Progress Notes (Signed)
 [facility] Progress Note  Name: Kathryn Beck MRN: [id number] DOB: 07-09-1955  Chief Complaint:  confusion  Subjective:  Seen and examined on dialysis.  Procedure supervised.  Blood pressure 173/64 and HR 73.  Tolerating goal.   Left AVF in use.  She has had some pain - left hand.    Review of systems:  Denies shortness of breath or chest pain Denies n/v  ------------------- Background on consult:   Kathryn Beck is a 68 y.o. female with a PMH significant for ESRD on HD MWF at [facility]. She has a past medical history significant for newly diagnosed atrial fib (on apixaban ), PAD  (s/p R BKA in 05/2022, prior lithotripsy and stenting of the left superficial femoral and popliteal artery in 06/2022 and ultimately L BKA in 09/2022), HTN, HLD, T2DM, bilateral adrenal adenoma (followed by Endocrinology), chronic HFpEF, aortic stenosis, OSA, history of TIA's, history of recurrent chest pain  (cath in 12/2018 showing normal coronary arteries), and recent admission to [facility] for left hand MSSA infection who presented to [facility] ED via EMS on 11/10/23 from HD with confusion and lethargy.  In the ED, temp 98, Bp 162/68, HR 71, SpO2 100% on room air.  Labs notable for WBC 17.6, Hgb 10.3, Na 137, K 3.5, BUN 21, Cr 2.81, Ca 8.2, glucose 73, alb 2.4, INR 1.3, UA +bld, moderate leukocytes, 100 protein, >50 WBC, >50 RBC's.  Pt was admitted for possible UTI and hypoglycemia.  We were consulted to provide dialysis during her admission.  She did not complete her full HD session yesterday.   Family at the bedside this morning.  Pt is feeling much better.  She did have FOBT+ and is awaiting GI evaluation   Intake/Output Summary (Last 24 hours) at 11/12/2023 1004 Last data filed at 11/12/2023 0931 Gross per 24 hour  Intake 120 ml  Output --  Net 120 ml    Vitals:  Vitals:   11/12/23 0457 11/12/23 0945 11/12/23 0946 11/12/23 0950  BP:  (!) 160/66  (!) 163/66  Pulse:  69  69  Resp:  20     Temp:  97.7 F (36.5 C)    TempSrc:  Oral    SpO2:  100%    Weight: 69.9 kg  70 kg      Physical Exam:  General adult female in bed in no acute distress HEENT normocephalic atraumatic extraocular movements intact sclera anicteric Neck supple trachea midline Lungs clear to auscultation bilaterally normal work of breathing at rest on room air Heart S1S2 no rub Abdomen soft nontender nondistended Extremities no edema residual limbs; bilateral BKA; left wrist splint Psych normal mood and affect Access: LAVF in use   Medications reviewed   Labs:     Latest Ref Rng & Units 11/12/2023    4:14 AM 11/11/2023    4:27 AM 11/10/2023    9:55 AM  BMP  Glucose 70 - 99 mg/dL 888  99  73   BUN 8 - 23 mg/dL 38  29  21   Creatinine 0.44 - 1.00 mg/dL 5.31  6.30  7.18   Sodium 135 - 145 mmol/L 138  138  137   Potassium 3.5 - 5.1 mmol/L 4.1  3.8  3.5   Chloride 98 - 111 mmol/L 101  100  97   CO2 22 - 32 mmol/L 27  28  27    Calcium  8.9 - 10.3 mg/dL 8.0  7.9  8.2     Dialysis Orders: [facility] - MWF 3.5hrs BFR 400 DFR 500  2K/2.5Ca  EDW 73.5kg Heparin  bolus 1000 units Calcitriol  with HD - last dose 10/29/23 Sensipar  90mg  with HD - last dose 10/29/23 Micera 50mcg Q2wks - last dose 11/06/23 [name]  1000mg  TID with meals   Assessment/Plan:     AMS - likely related to hypoglycemia.  Markedly improved on later exams per charting  UTI - per primary team    ESRD   continue with HD on MWF schedule while she remains an inpatient.  On torsemide  non-HD days I discontinued the Fleet's enema order as she is ESRD  Hypertension/volume  -  stable. Optimize volume with HD  Anemia of CKD - FOBT+.  GI has seen.  Note on anticoagulation at home. Recent ESA outpatient and is not yet due for redose   Metabolic bone disease -   pause home velphoro  given  hypophosphatemia. note on calcitriol  with HD as well as sensipar  DM type 2- per primary team.  Hypoglycemia earlier prompting presentation  and family reports decreased po intake since discharge from [facility].  Adjust meds per primary team.  Left hand MSSA infection - continue Ancef  per primary team  P. Atrial fib with rvr - improved with metoprolol   Disposition - per primary team.  Nephrology will review her chart over the weekend but she will not by physically seen over the weekend.  Please do not hesitate to reach out to the on call provider with any questions or concerns   Kathryn JAYSON Saba, MD 11/12/2023 10:27 AM

## 2023-11-12 NOTE — Progress Notes (Signed)
 The patient was dialyzed to remove 2 liters of fluid with no adverse reactions. Medications administered as follows: Ancef  2 g IV . Dilaudid  1 mg IV. Calcitriol  1 mcg po.  11/12/23 1322  Vitals  Temp 98 F (36.7 C)  Temp Source Oral  BP (!) 145/60  BP Location Right Wrist  BP Method Automatic  Patient Position (if appropriate) Lying  Pulse Rate 70  Resp 18  Weight 68 kg  Type of Weight Post-Dialysis  During Treatment Monitoring  Intra-Hemodialysis Comments Tx completed  Post Treatment  Dialyzer Clearance Lightly streaked  Hemodialysis Intake (mL) 0 mL  Liters Processed 80  Fluid Removed (mL) 2000 mL  Tolerated HD Treatment Yes  Post-Hemodialysis Comments see notes.  AVG/AVF Arterial Site Held (minutes) 8 minutes  AVG/AVF Venous Site Held (minutes) 8 minutes  Fistula / Graft Left Upper arm Arteriovenous fistula  Placement Date/Time: 09/02/17 1150   Placed prior to admission: Yes  Orientation: Left  Access Location: Upper arm  Access Type: Arteriovenous fistula  Site Condition No complications  Fistula / Graft Assessment Present;Thrill;Bruit  Status Deaccessed  Needle Size 15  Drainage Description None

## 2023-11-12 NOTE — Plan of Care (Signed)
  Problem: Skin Integrity: Goal: Risk for impaired skin integrity will decrease Outcome: Progressing   Problem: Activity: Goal: Risk for activity intolerance will decrease Outcome: Progressing

## 2023-11-12 NOTE — Progress Notes (Signed)
 PHARMACY CONSULT NOTE FOR:  OUTPATIENT  PARENTERAL ANTIBIOTIC THERAPY (OPAT)  Informational as the patient will receive antibiotics outpatient at the HD center  Indication: MSSA hand abscess Regimen: Cefazolin  2g/HD-MWF End date: 11/24/23  IV antibiotic discharge orders are pended. To discharging provider:  please sign these orders via discharge navigator,  Select New Orders & click on the button choice - Manage This Unsigned Work.     Thank you for allowing pharmacy to be a part of this patient's care.  Damien Quiet, PharmD, BCPS, BCIDP Infectious Diseases Clinical Pharmacist Phone: [phone] 11/12/2023 8:11 AM   **Pharmacist phone directory can now be found on [email or web address] ([email or web address]).  Listed under [facility].

## 2023-11-12 NOTE — Discharge Summary (Addendum)
 Physician Discharge Summary   Patient: Kathryn Beck MRN: [id number] DOB: [birth year]  Admit date:     11/10/2023  Discharge date: 11/12/23  Discharge Physician: Adriana DELENA Grams   PCP: Antonetta Rollene BRAVO, MD   No changes to this discharge summary    Recommendations at discharge:   CBC Monday, 11/15/2023 before hemodialysis-results to PCP and nephrologist Follow-up with a gastroenterologist as an outpatient Continue antibiotics with hemodialysis Follow-up with infectious disease team as scheduled Follow-up with your PCP in 1 week Check your blood sugar daily preferably before meals-keep log and present with PCP before starting insulin  regimen Strict renal diabetic diet recommend  Discharge Diagnoses: Principal Problem:   SIRS (systemic inflammatory response syndrome) (HCC) Active Problems:   Afib (HCC)   Abscess of left hand   Chronic diastolic (congestive) heart failure (HCC)   ESRD on hemodialysis (HCC)   CVA (cerebral vascular accident) (HCC)   Depression, major, single episode, moderate (HCC)   Hyperlipidemia   HTN (hypertension)   Chronic pain syndrome   Anemia of chronic disease   Diabetes mellitus (HCC)   Morbid obesity (HCC)   S/P BKA (below knee amputation), right (HCC)   Amputated left leg (HCC)   Acute confusion   GAD (generalized anxiety disorder)   Uncontrolled type 2 diabetes mellitus with hyperglycemia (HCC)  Resolved Problems:   Depression   Confusion  Hospital Course: Kathryn Beck is a 68 year old female with significant history of Atrial fib on apixaban , PAD  (s/p R BKA in 05/2022, prior lithotripsy and stenting of the left superficial femoral and popliteal artery in 06/2022 and ultimately L BKA in 09/2022), HTN, HLD, Type 2 DM, bilateral adrenal adenoma (followed by Endocrinology), chronic HFpEF, aortic stenosis, OSA, ESRD (MWF), history of TIA's, history of recurrent chest pain  (cath in 12/2018 showing normal coronary arteries) recent  hospitalization for left hand I&D, MSSA infection on antibiotics IV Ancef  with HD... Presented from hemodialysis today for drowsiness, confusion. Upon applying the patient was found redly, lethargic.Kathryn Beck  8 AM, today's dialysis was started at 6:45, was cut short due to her change in status.   ED evaluation:  Blood pressure (!) 162/68, pulse 71, temperature 98.5 F (36.9 C), temperature source Oral, resp. rate 19, SpO2 100%.  CBG: 63, 68, 115 Labs: Chloride 97 creatinine 2.18, calcium  8.2 albumin  2.4 GFR 9, 18, CBC 16.7, 17.6, hemoglobin 10.3 UA: Amber, large hemoglobin, moderate leukocytes, moderate protein, many bacteria, WBC> 50   Requested patient to be admitted for altered mental status, confusion, likely complicated by UTI and hypoglycemia    SIRS (systemic inflammatory response syndrome) (HCC) Patient remains hemodynamically stable -Sepsis ruled out   Confusion during hemodialysis POA: Patient met SIRS criteria due to altered mental status, leukocytosis 17.6, -UA positive for hgb, leukocytes, many bacteria, WBC > 500   - Discussed the case with infectious disease team at [facility] Agreed the patient was not septic source of infection not likely urine   - ID recommended continue current antibiotic of Ancef  with hemodialysis Mondays, Wednesdays and Fridays   -Discontinue Rocephin  - S/p gentle IV fluid hydration - Lactic acid 1.7   (Note patient is on hemodialysis-does not urinate normally likely colonized-no complaint of dysuria, fever, chills)   Blood cultures/urine cultures --  no growth to date   Acute on chronic anemia-anemia of chronic disease due to ESRD -with iron deficiency anemia Hemoccult positive - Monitoring H&H, mild drop noted   -Chronically anticoagulated with Eliquis , was on hold -per GI okay  to resume   - Total iron low at 27, folate 17.3, B12 997    Latest Ref Rng & Units 11/12/2023    1:51 PM 11/12/2023    4:14 AM 11/11/2023   12:41 PM  CBC  WBC 4.0 - 10.5  K/uL  14.9    Hemoglobin 12.0 - 15.0 g/dL 9.3  8.5  89.9   Hematocrit 36.0 - 46.0 % 29.8  28.2  32.5   Platelets 150 - 400 K/uL  404       -Discussed with gastroenterologist they do not recommend any upper or lower endoscopy at this point due to increased risk secondary to patient's cardiovascular history   Repeating H&H today postdialysis Hemoglobin 9.3/   Abscess of left hand - Stable, continue antibiotics - Home antibiotics Ancef  with hemodialysis TTS -Continue Rocephin  for now   Afib (HCC) - Heart rate controlled, on Eliquis  and metoprolol - Per GI okay to resume Eliquis       Depression, major, single episode, moderate (HCC) - Review home medication, resume Zoloft , BuSpar , holding Cymbalta    CVA (cerebral vascular accident) (HCC) - Continue statins, holding Eliquis  -Neurocheck for confusion   ESRD on hemodialysis (HCC) - On hemodialysis MWF -Partial hemodialysis 11/11/2023-hemodialysis today 11/12/2023 - Hemodialysis per nephrology   Chronic diastolic (congestive) heart failure (HCC) - Euvolemic, monitoring volume status: Dialysis Currently stable - Resuming home Demadex     Last echo 06/15/2023, EF 55%.  No LVH,. Left ventricular diastolic parameters are consistent with Grade I diastolic dysfunction (impaired relaxation). Elevated left ventricular end-diastolic pressure.    Chronic pain syndrome - Minimizing narcotics due to altered mental status   HTN (hypertension)- stable, continue Norvasc , metoprolol      Hyperlipidemia - Continue statins   GAD (generalized anxiety disorder) - As needed benzodiazepines, will monitor due to confusion   Acute confusion - Likely due to hypoglycemia, exacerbated by infection -Monitoring closely -Continue neurochecks Avoiding contributing sedative meds   Amputated left leg (HCC) - Status post bilateral BKA, monitoring closely No signs of infection   S/P BKA (below knee amputation), right (HCC) - No signs of infection at  stump   Morbid obesity (HCC)   Height 5 5, weight 76.7 kg 7 days ago Will calculate BMI, Patient will benefit from outpatient follow-up with PCP for weight management   Diabetes mellitus (HCC) - Last A1c 6.7 -Hypoglycemic this a.m. CBG (last 3) CBG (last 3)  Recent Labs    11/11/23 1610 11/11/23 2136 11/12/23 0744  GLUCAP 117* 138* 119*      - To resume home medication of insulin  glargine  Was on hold during this hospitalization        ----------------------------------------------------------------------------------------------------------------------------------------------- Nutritional status:  The patient's BMI is: Body mass index is 25.68 kg/m. I agree with the assessment and plan as outlined  Nutrition Status: ------------------------------------------------------------------------------------------------------------------- Cultures; Blood Cultures x 2 >> no growth to date Urine Culture  >>>        Consultants: Infectious disease team/nephrology Procedures performed: Hemodialysis Disposition: Home Diet recommendation:  Discharge Diet Orders (From admission, onward)     Start     Ordered   11/12/23 0000  Diet - low sodium heart healthy        11/12/23 1542           Renal diet DISCHARGE MEDICATION: Allergies as of 11/12/2023       Reactions   Ace Inhibitors Anaphylaxis, Swelling   Penicillins Itching, Swelling   Has tolerated cefazolin  on multiple occasions    Statins Other (  See Comments)   Elevated LFT's   Albuterol  Swelling        Medication List     PAUSE taking these medications    apixaban  5 MG Tabs tablet Wait to take this until: November 14, 2023 Commonly known as: ELIQUIS  Take 1 tablet (5 mg total) by mouth 2 (two) times daily. For Afib   Toujeo  Max SoloStar 300 UNIT/ML Solostar Pen Wait to take this until: November 14, 2023 Generic drug: insulin  glargine (2 Unit Dial ) Inject 30 Units into the skin daily. May need to slowly  increase the dose depending upon your blood sugar, follow-up with PCP       STOP taking these medications    DULoxetine  60 MG capsule Commonly known as: CYMBALTA        TAKE these medications    acetaminophen  500 MG tablet Commonly known as: TYLENOL  Take 1,000 mg by mouth every 6 (six) hours as needed for mild pain (pain score 1-3).   amLODipine  10 MG tablet Commonly known as: NORVASC  Take 0.5 tablets (5 mg total) by mouth daily.   aspirin  81 MG chewable tablet Chew 2 tablets (162 mg total) by mouth daily.   busPIRone  5 MG tablet Commonly known as: BUSPAR  TAKE ONE TABLET BY MOUTH TWICE DAILY   calcitRIOL  0.5 MCG capsule Commonly known as: ROCALTROL  Take 2 capsules (1 mcg total) by mouth every Monday, Wednesday, and Friday with hemodialysis.   ceFAZolin  2-4 GM/100ML-% IVPB Commonly known as: ANCEF  Inject 100 mLs (2 g total) into the vein every Monday, Wednesday, and Friday with hemodialysis for 12 days. Last day of therapy 11/24/23 What changed: additional instructions   cinacalcet  30 MG tablet Commonly known as: SENSIPAR  Take 3 tablets (90 mg total) by mouth 3 (three) times a week. What changed: when to take this   Darbepoetin Alfa  40 MCG/0.4ML Sosy injection Commonly known as: ARANESP  Inject 0.4 mLs (40 mcg total) into the skin every Friday at 6 PM. What changed: how to take this   ezetimibe  10 MG tablet Commonly known as: ZETIA  Take 1 tablet (10 mg total) by mouth daily.   metoprolol  succinate 50 MG 24 hr tablet Commonly known as: TOPROL -XL TAKE (1) TABLET BY MOUTH DAILY WITH FOOD *TAKE AFTER DIALYSIS* What changed:  how much to take how to take this when to take this additional instructions   Rena-Vite Rx 1 MG Tabs Take 1 tablet by mouth daily.   sertraline  50 MG tablet Commonly known as: ZOLOFT  TAKE ONE TABLET BY MOUTH ONCE DAILY   torsemide  100 MG tablet Commonly known as: DEMADEX  Take 1 tablet (100 mg total) by mouth every Tuesday, Thursday,  Saturday, and Sunday. What changed: additional instructions   [name]  500 MG chewable tablet Generic drug: sucroferric oxyhydroxide Chew 1-2 tablets (500-1,000 mg total) by mouth See admin instructions. Take 1000mg  (2 tablets) by mouth with meals and 500mg  (1 tablet) with snacks               Discharge Care Instructions  (From admission, onward)           Start     Ordered   11/12/23 0000  Discharge wound care:       Comments: Cleanse left hand with NS and pat dry. Apply silvadene  cream to open wounds and cover with nonadherent silicone mesh, Mepitel (LAWSON # [id number]).  Reapply each shift. Cover with gauze, kerlix and tape   11/12/23 1542            Discharge Exam:  Filed Weights   11/12/23 0457 11/12/23 0946 11/12/23 1322  Weight: 69.9 kg 70 kg 68 kg    General:  AAO x 3,  cooperative, no distress;   HEENT:  Normocephalic, PERRL, otherwise with in Normal limits   Neuro:  CNII-XII intact. , normal motor and sensation, reflexes intact   Lungs:   Clear to auscultation BL, Respirations unlabored,  No wheezes / crackles  Cardio:    S1/S2, RRR, No murmure, No Rubs or Gallops   Abdomen:  Soft, non-tender, bowel sounds active all four quadrants, no guarding or peritoneal signs.  Muscular  skeletal:  Bilateral BKA Limited exam -global generalized weaknesses - in bed, able to move all 4 extremities,   2+ pulses,  symmetric, No pitting edema  Skin:  Dry, warm to touch, negative for any Rashes,      Wounds: Acute on chronic posterior hand/wrist wound -see picture   Media Information  Document Information    Condition at discharge: good  The results of significant diagnostics from this hospitalization (including imaging, microbiology, ancillary and laboratory) are listed below for reference.   Imaging Studies: CT Head Wo Contrast Result Date: 11/10/2023 EXAM: CT HEAD WITHOUT CONTRAST 11/10/2023 10:52:18 AM TECHNIQUE: CT of the head was performed without the  administration of intravenous contrast. Automated exposure control, iterative reconstruction, and/or weight based adjustment of the mA/kV was utilized to reduce the radiation dose to as low as reasonably achievable. COMPARISON: MRI of the head dated 12/17/2022. CLINICAL HISTORY: Mental status change, unknown cause. Pt arrived via [facility] from dialysis, from home, pt was A\T\O this morning 0645 am before Tx and become lethargic and zoned out around 0800 this morning and was slow to respond, pt states she feels a little sleepy but able to answer EMSs questions. FINDINGS: BRAIN AND VENTRICLES: No acute hemorrhage. Gray-white differentiation is preserved. No hydrocephalus. No extra-axial collection. No mass effect or midline shift. Chronic encephalomalacia changes present within the left parietal and occipital lobes. Mild periventricular white matter disease. ORBITS: No acute abnormality. SINUSES: No acute abnormality. SOFT TISSUES AND SKULL: No acute soft tissue abnormality. No skull fracture. Calcifications within the carotid siphons. IMPRESSION: 1. No acute intracranial abnormality. 2. Chronic encephalomalacia changes in the left parietal and occipital lobes. 3. Mild periventricular white matter disease. 4. Calcifications within the carotid siphons. Electronically signed by: Evalene Coho MD 11/10/2023 10:58 AM EDT RP Workstation: [id number]   DG Chest Portable 1 View Result Date: 11/10/2023 CLINICAL DATA:  Confusion.  Lethargy. EXAM: PORTABLE CHEST 1 VIEW COMPARISON:  10/27/2023. FINDINGS: Stable cardiomegaly. Aortic atherosclerosis. Similar streaky left basilar opacities, favored to reflect atelectasis. The right lung is clear. No sizable pleural effusion or pneumothorax. Degenerative changes of the right glenohumeral joint. No acute osseous abnormality. IMPRESSION: 1. Similar streaky left basilar opacities, favored to reflect atelectasis. 2. Stable cardiomegaly. Electronically Signed   By: Harrietta Sherry  M.D.   On: 11/10/2023 10:10   CT Hand Left Wo Contrast Addendum Date: 10/29/2023 ADDENDUM REPORT: 10/29/2023 12:32 ADDENDUM: These results were called by telephone at the time of interpretation on 10/29/2023 at 12:08 pm to provider JOSEPH ZAMMIT , who verbally acknowledged these results. Electronically Signed   By: Harrietta Sherry M.D.   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 Blisters on the posterior left hand. Pain. EXAM: CT OF THE LEFT HAND WITHOUT CONTRAST TECHNIQUE: Multidetector CT imaging of the left hand was performed according to the standard protocol.  Multiplanar CT image reconstructions were also generated. RADIATION DOSE REDUCTION: This exam was performed according to the departmental dose-optimization program which includes automated exposure control, adjustment of the mA and/or kV according to patient size and/or use of iterative reconstruction technique. COMPARISON:  Left hand radiograph dated 11/10/2023. FINDINGS: Bones/Joint/Cartilage No acute fracture or dislocation. Status post amputation of the third proximal, middle, and distal phalanges. No evidence of acute osteolysis or erosive changes. Mild degenerative arthropathy throughout the hand. Ligaments Ligaments are suboptimally evaluated by CT. Soft tissue / Muscles and Tendons Blistering along the dorsal hand. Marked subcutaneous edema along the dorsal hand extending to the underlying extensor digitorum tendons with numerous scattered foci of soft tissue gas. Surrounding fat stranding. No discrete loculated fluid collection. Peripheral vascular calcifications. IMPRESSION: 1. Blistering and marked subcutaneous edema with stranding along the dorsal hand extending to the underlying extensor digitorum tendons with numerous scattered foci of soft tissue gas. These findings are concerning for gas-forming soft tissue infection. No discrete loculated fluid collection. 2. No  acute osseous abnormality. 3. Status post amputation of the third digit. Electronically Signed: By: Harrietta Sherry M.D. On: 10/29/2023 12:02   DG Hand Complete Left Result Date: 10/29/2023 CLINICAL DATA:  Pain. EXAM: LEFT HAND - COMPLETE 3+ VIEW COMPARISON:  None Available. FINDINGS: There is amputation of the middle finger at the level of metacarpophalangeal joint. No acute fracture or dislocation. No aggressive osseous lesion. 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 No periarticular osteopenia or bony erosions. No radiopaque foreign bodies. There is diffuse soft tissue swelling over the dorsum of the hand. There are few foci of air within the soft tissue. However, underlying bones appear intact. No focal bone erosions. IMPRESSION: 1. No acute osseous abnormality of the left hand. 2. Amputation of middle finger at the level of metacarpophalangeal joint. 3. There is diffuse soft tissue swelling over the dorsum of the hand. There are few foci of air within the soft tissue. However, underlying bones appear intact. No focal bone erosions. Electronically Signed   By: Ree Molt M.D.   On: 10/29/2023 10:09   US  LT UPPER EXTREM LTD SOFT TISSUE NON VASCULAR Result Date: 10/28/2023 CLINICAL DATA:  Swelling. Amputation of the left long finger on 09/29/2023. EXAM: ULTRASOUND left UPPER EXTREMITY LIMITED TECHNIQUE: Ultrasound examination of the upper extremity soft tissues was performed in the area of clinical concern. COMPARISON:  None Available. FINDINGS: Targeted sonographic evaluation of the dorsal left hand corresponding to the area of clinical concern demonstrates diffuse subcutaneous edema and ill-defined fluid without discrete loculated fluid collection. Irregular small echogenic foci with areas of dirty shadowing in the subcutaneous soft tissues could reflect soft tissue air. These findings may be secondary  to postsurgical change, however, soft tissue infection with cellulitis and possible soft tissue gas cannot be excluded. IMPRESSION: Targeted sonographic evaluation of the dorsal left hand corresponding to the area of clinical concern demonstrates diffuse subcutaneous edema and ill-defined fluid without discrete loculated fluid collection. Irregular small echogenic foci with areas of dirty shadowing in the subcutaneous soft tissues could reflect soft tissue air. These findings may be secondary to postsurgical change, however, soft tissue infection with cellulitis and possible soft tissue gas cannot be excluded. Consider further evaluation with cross-sectional imaging such as contrast-enhanced CT or MRI. Electronically Signed   By: Harrietta Sherry M.D.   On: 10/28/2023 15:04   DG Chest Portable 1 View Result Date: 10/27/2023 CLINICAL DATA:  Shortness  of breath. EXAM: PORTABLE CHEST 1 VIEW COMPARISON:  April 30, 2023. FINDINGS: Stable cardiomegaly. Right lung is clear. Minimal left basilar subsegmental atelectasis is noted. Bony thorax is unremarkable. IMPRESSION: Minimal left basilar subsegmental atelectasis. Electronically Signed   By: Lynwood Landy Raddle M.D.   On: 10/27/2023 12:08    Microbiology: Results for orders placed or performed during the hospital encounter of 11/10/23  Urine Culture     Status: None   Collection Time: 11/10/23 11:01 AM   Specimen: In/Out Cath Urine  Result Value Ref Range Status   Specimen Description   Final    IN/OUT CATH URINE Performed at [facility], [address], [place], [place] [address]    Special Requests   Final    NONE Performed at [facility], [address] [place], [place] [address]    Culture   Final    NO GROWTH Performed at [facility], [address] [place], [place] [address]    Report Status 11/11/2023 FINAL  Final  Culture, blood (Routine X 2) w Reflex to ID Panel     Status: None (Preliminary result)   Collection Time: 11/10/23   1:01 PM   Specimen: BLOOD  Result Value Ref Range Status   Specimen Description BLOOD BLOOD RIGHT ARM  Final   Special Requests   Final    Blood Culture adequate volume BOTTLES DRAWN AEROBIC AND ANAEROBIC   Culture   Final    NO GROWTH 2 DAYS Performed at [facility], [address], [place], [place] [address]    Report Status PENDING  Incomplete  Culture, blood (Routine X 2) w Reflex to ID Panel     Status: None (Preliminary result)   Collection Time: 11/10/23  1:01 PM   Specimen: BLOOD  Result Value Ref Range Status   Specimen Description BLOOD BLOOD RIGHT HAND  Final   Special Requests   Final    BOTTLES DRAWN AEROBIC ONLY Blood Culture results may not be optimal due to an inadequate volume of blood received in culture bottles   Culture   Final    NO GROWTH 2 DAYS Performed at [facility], [address] [place], [place] [address]    Report Status PENDING  Incomplete   *Note: Due to a large number of results and/or encounters for the requested time period, some results have not been displayed. A complete set of results can be found in Results Review.    Labs: CBC: Recent Labs  Lab 11/10/23 0955 11/11/23 0427 11/11/23 1241 11/12/23 0414 11/12/23 1351  WBC 17.6* 16.3*  --  14.9*  --   HGB 10.3* 8.7* 10.0* 8.5* 9.3*  HCT 33.1* 28.9* 32.5* 28.2* 29.8*  MCV 79.0* 80.3  --  80.1  --   PLT 381 352  --  404*  --    Basic Metabolic Panel: Recent Labs  Lab 11/10/23 0955 11/11/23 0427 11/12/23 0414  NA 137 138 138  K 3.5 3.8 4.1  CL 97* 100 101  CO2 27 28 27   GLUCOSE 73 99 111*  BUN 21 29* 38*  CREATININE 2.81* 3.69* 4.68*  CALCIUM  8.2* 7.9* 8.0*  MG 1.9  --   --   PHOS 1.7*  --   --    Liver Function Tests: Recent Labs  Lab 11/10/23 0955  AST 19  ALT <5  ALKPHOS 102  BILITOT 0.4  PROT 6.2*  ALBUMIN  2.4*   CBG: Recent Labs  Lab 11/11/23 0711 11/11/23 1332 11/11/23  1610 11/11/23 2136 11/12/23 0744  GLUCAP 92 162* 117* 138* 119*    Discharge  time spent: greater than 30 minutes.  Signed: Adriana DELENA Grams, MD [facility] 11/12/2023

## 2023-11-13 DIAGNOSIS — R6510 Systemic inflammatory response syndrome (SIRS) of non-infectious origin without acute organ dysfunction: Secondary | ICD-10-CM | POA: Diagnosis not present

## 2023-11-13 DIAGNOSIS — N3001 Acute cystitis with hematuria: Secondary | ICD-10-CM | POA: Diagnosis not present

## 2023-11-13 LAB — BASIC METABOLIC PANEL WITH GFR
Anion gap: 9 (ref 5–15)
BUN: 22 mg/dL (ref 8–23)
CO2: 30 mmol/L (ref 22–32)
Calcium: 8 mg/dL — ABNORMAL LOW (ref 8.9–10.3)
Chloride: 99 mmol/L (ref 98–111)
Creatinine, Ser: 3.04 mg/dL — ABNORMAL HIGH (ref 0.44–1.00)
GFR, Estimated: 16 mL/min — ABNORMAL LOW (ref >60)
Glucose, Bld: 111 mg/dL — ABNORMAL HIGH (ref 70–99)
Potassium: 4.1 mmol/L (ref 3.5–5.1)
Sodium: 138 mmol/L (ref 135–145)

## 2023-11-13 LAB — HEPATITIS B SURFACE ANTIBODY, QUANTITATIVE: Hep B S AB Quant (Post): 285 m[IU]/mL

## 2023-11-13 LAB — CBC
HCT: 28.9 % — ABNORMAL LOW (ref 36.0–46.0)
Hemoglobin: 8.9 g/dL — ABNORMAL LOW (ref 12.0–15.0)
MCH: 24.8 pg — ABNORMAL LOW (ref 26.0–34.0)
MCHC: 30.8 g/dL (ref 30.0–36.0)
MCV: 80.5 fL (ref 80.0–100.0)
Platelets: 436 K/uL — ABNORMAL HIGH (ref 150–400)
RBC: 3.59 MIL/uL — ABNORMAL LOW (ref 3.87–5.11)
RDW: 20.5 % — ABNORMAL HIGH (ref 11.5–15.5)
WBC: 13.2 K/uL — ABNORMAL HIGH (ref 4.0–10.5)
nRBC: 0.2 % (ref 0.0–0.2)

## 2023-11-13 LAB — GLUCOSE, CAPILLARY
Glucose-Capillary: 146 mg/dL — ABNORMAL HIGH (ref 70–99)
Glucose-Capillary: 116 mg/dL — ABNORMAL HIGH (ref 70–99)

## 2023-11-13 NOTE — Progress Notes (Signed)
 Physician Discharge Summary   Patient: Kathryn Beck MRN: [id number] DOB: 1955/09/20  Admit date:     11/10/2023  Discharge date: 11/13/23  Discharge Physician: Adriana DELENA Grams   PCP: Antonetta Rollene BRAVO, MD   The patient was seen and examined, cleared to be discharged home    Recommendations at discharge:   CBC Monday, 11/15/2023 before hemodialysis-results to PCP and nephrologist Follow-up with a gastroenterologist as an outpatient Continue antibiotics with hemodialysis Follow-up with infectious disease team as scheduled Follow-up with your PCP in 1 week Check your blood sugar daily preferably before meals-keep log and present with PCP before starting insulin  regimen Strict renal diabetic diet recommend  Discharge Diagnoses: Principal Problem:   SIRS (systemic inflammatory response syndrome) (HCC) Active Problems:   Afib (HCC)   Abscess of left hand   Chronic diastolic (congestive) heart failure (HCC)   ESRD on hemodialysis (HCC)   CVA (cerebral vascular accident) (HCC)   Depression, major, single episode, moderate (HCC)   Hyperlipidemia   HTN (hypertension)   Chronic pain syndrome   Anemia of chronic disease   Diabetes mellitus (HCC)   Morbid obesity (HCC)   S/P BKA (below knee amputation), right (HCC)   Amputated left leg (HCC)   Acute confusion   GAD (generalized anxiety disorder)   Uncontrolled type 2 diabetes mellitus with hyperglycemia (HCC)  Resolved Problems:   Depression   Confusion  Hospital Course: Kathryn Beck is a 68 year old female with significant history of Atrial fib on apixaban , PAD  (s/p R BKA in 05/2022, prior lithotripsy and stenting of the left superficial femoral and popliteal artery in 06/2022 and ultimately L BKA in 09/2022), HTN, HLD, Type 2 DM, bilateral adrenal adenoma (followed by Endocrinology), chronic HFpEF, aortic stenosis, OSA, ESRD (MWF), history of TIA's, history of recurrent chest pain  (cath in 12/2018 showing normal  coronary arteries) recent hospitalization for left hand I&D, MSSA infection on antibiotics IV Ancef  with HD... Presented from hemodialysis today for drowsiness, confusion. Upon applying the patient was found redly, lethargic.[name]  8 AM, today's dialysis was started at 6:45, was cut short due to her change in status.   ED evaluation:  Blood pressure (!) 162/68, pulse 71, temperature 98.5 F (36.9 C), temperature source Oral, resp. rate 19, SpO2 100%.  CBG: 63, 68, 115 Labs: Chloride 97 creatinine 2.18, calcium  8.2 albumin  2.4 GFR 9, 18, CBC 16.7, 17.6, hemoglobin 10.3 UA: Amber, large hemoglobin, moderate leukocytes, moderate protein, many bacteria, WBC> 50   Requested patient to be admitted for altered mental status, confusion, likely complicated by UTI and hypoglycemia    SIRS (systemic inflammatory response syndrome) (HCC) Patient remains hemodynamically stable -Sepsis ruled out   Confusion during hemodialysis POA: Patient met SIRS criteria due to altered mental status, leukocytosis 17.6, -UA positive for hgb, leukocytes, many bacteria, WBC > 500   - Discussed the case with infectious disease team at [facility] Agreed the patient was not septic source of infection not likely urine   - ID recommended continue current antibiotic of Ancef  with hemodialysis Mondays, Wednesdays and Fridays   -Discontinue Rocephin  - S/p gentle IV fluid hydration - Lactic acid 1.7   (Note patient is on hemodialysis-does not urinate normally likely colonized-no complaint of dysuria, fever, chills)   Blood cultures/urine cultures --  no growth to date   Acute on chronic anemia-anemia of chronic disease due to ESRD -with iron deficiency anemia Hemoccult positive - Monitoring H&H, mild drop noted   -Chronically anticoagulated with Eliquis , was  on hold -per GI okay to resume   - Total iron low at 27, folate 17.3, B12 997    Latest Ref Rng & Units 11/13/2023    3:50 AM 11/12/2023    1:51 PM 11/12/2023    4:14  AM  CBC  WBC 4.0 - 10.5 K/uL 13.2   14.9   Hemoglobin 12.0 - 15.0 g/dL 8.9  9.3  8.5   Hematocrit 36.0 - 46.0 % 28.9  29.8  28.2   Platelets 150 - 400 K/uL 436   404      -Discussed with gastroenterologist they do not recommend any upper or lower endoscopy at this point due to increased risk secondary to patient's cardiovascular history   Repeating H&H today postdialysis Hemoglobin 9.3/   Abscess of left hand - Stable, continue antibiotics - Home antibiotics Ancef  with hemodialysis TTS -Continue Rocephin  for now   Afib (HCC) - Heart rate controlled, on Eliquis  and metoprolol - Per GI okay to resume Eliquis       Depression, major, single episode, moderate (HCC) - Review home medication, resume Zoloft , BuSpar , holding Cymbalta    CVA (cerebral vascular accident) (HCC) - Continue statins, holding Eliquis  -Neurocheck for confusion   ESRD on hemodialysis (HCC) - On hemodialysis MWF -Partial hemodialysis 11/11/2023-hemodialysis today 11/12/2023 - Hemodialysis per nephrology   Chronic diastolic (congestive) heart failure (HCC) - Euvolemic, monitoring volume status: Dialysis Currently stable - Resuming home Demadex     Last echo 06/15/2023, EF 55%.  No LVH,. Left ventricular diastolic parameters are consistent with Grade I diastolic dysfunction (impaired relaxation). Elevated left ventricular end-diastolic pressure.    Chronic pain syndrome - Minimizing narcotics due to altered mental status   HTN (hypertension)- stable, continue Norvasc , metoprolol      Hyperlipidemia - Continue statins   GAD (generalized anxiety disorder) - As needed benzodiazepines, will monitor due to confusion   Acute confusion - Likely due to hypoglycemia, exacerbated by infection -Monitoring closely -Continue neurochecks Avoiding contributing sedative meds   Amputated left leg (HCC) - Status post bilateral BKA, monitoring closely No signs of infection   S/P BKA (below knee amputation), right  (HCC) - No signs of infection at stump   Morbid obesity (HCC)   Height 5 5, weight 76.7 kg 7 days ago Will calculate BMI, Patient will benefit from outpatient follow-up with PCP for weight management   Diabetes mellitus (HCC) - Last A1c 6.7 -Hypoglycemic this a.m. CBG (last 3) CBG (last 3)  Recent Labs    11/12/23 1718 11/12/23 2100 11/13/23 0719  GLUCAP 143* 187* 146*      - To resume home medication of insulin  glargine  Was on hold during this hospitalization        ----------------------------------------------------------------------------------------------------------------------------------------------- Nutritional status:  The patient's BMI is: Body mass index is 25.68 kg/m. I agree with the assessment and plan as outlined  Nutrition Status: ------------------------------------------------------------------------------------------------------------------- Cultures; Blood Cultures x 2 >> no growth to date Urine Culture  >>>        Consultants: Infectious disease team/nephrology Procedures performed: Hemodialysis Disposition: Home Diet recommendation:  Discharge Diet Orders (From admission, onward)     Start     Ordered   11/12/23 0000  Diet - low sodium heart healthy        11/12/23 1542           Renal diet DISCHARGE MEDICATION: Allergies as of 11/13/2023       Reactions   Ace Inhibitors Anaphylaxis, Swelling   Penicillins Itching, Swelling   Has tolerated cefazolin   on multiple occasions    Statins Other (See Comments)   Elevated LFT's   Albuterol  Swelling        Medication List     PAUSE taking these medications    apixaban  5 MG Tabs tablet Wait to take this until: November 14, 2023 Commonly known as: ELIQUIS  Take 1 tablet (5 mg total) by mouth 2 (two) times daily. For Afib   aspirin  81 MG chewable tablet Wait to take this until: November 16, 2023 Chew 2 tablets (162 mg total) by mouth daily.   Toujeo  Max SoloStar 300 UNIT/ML  Solostar Pen Wait to take this until: November 14, 2023 Generic drug: insulin  glargine (2 Unit Dial ) Inject 30 Units into the skin daily. May need to slowly increase the dose depending upon your blood sugar, follow-up with PCP       STOP taking these medications    DULoxetine  60 MG capsule Commonly known as: CYMBALTA        TAKE these medications    acetaminophen  500 MG tablet Commonly known as: TYLENOL  Take 1,000 mg by mouth every 6 (six) hours as needed for mild pain (pain score 1-3).   amLODipine  10 MG tablet Commonly known as: NORVASC  Take 0.5 tablets (5 mg total) by mouth daily.   busPIRone  5 MG tablet Commonly known as: BUSPAR  TAKE ONE TABLET BY MOUTH TWICE DAILY   calcitRIOL  0.5 MCG capsule Commonly known as: ROCALTROL  Take 2 capsules (1 mcg total) by mouth every Monday, Wednesday, and Friday with hemodialysis.   ceFAZolin  2-4 GM/100ML-% IVPB Commonly known as: ANCEF  Inject 100 mLs (2 g total) into the vein every Monday, Wednesday, and Friday with hemodialysis for 12 days. Last day of therapy 11/24/23 What changed: additional instructions   cinacalcet  30 MG tablet Commonly known as: SENSIPAR  Take 3 tablets (90 mg total) by mouth 3 (three) times a week. What changed: when to take this   Darbepoetin Alfa  40 MCG/0.4ML Sosy injection Commonly known as: ARANESP  Inject 0.4 mLs (40 mcg total) into the skin every Friday at 6 PM. What changed: how to take this   ezetimibe  10 MG tablet Commonly known as: ZETIA  Take 1 tablet (10 mg total) by mouth daily.   insulin  aspart 100 UNIT/ML injection Commonly known as: novoLOG  Inject 0-6 Units into the skin 3 (three) times daily with meals. CBG 70 - 120: 0 units  CBG 121 - 150: 0 units  CBG 151 - 200: 1 unit  CBG 201-250: 2 units  CBG 251-300: 3 units  CBG 301-350: 4 units  CBG 351-400: 5 units  CBG > 400: Give 10 units and call MD   metoprolol  succinate 50 MG 24 hr tablet Commonly known as: TOPROL -XL TAKE (1) TABLET  BY MOUTH DAILY WITH FOOD *TAKE AFTER DIALYSIS* What changed:  how much to take how to take this when to take this additional instructions   Rena-Vite Rx 1 MG Tabs Take 1 tablet by mouth daily.   sertraline  50 MG tablet Commonly known as: ZOLOFT  TAKE ONE TABLET BY MOUTH ONCE DAILY   torsemide  100 MG tablet Commonly known as: DEMADEX  Take 1 tablet (100 mg total) by mouth every Tuesday, Thursday, Saturday, and Sunday. What changed: additional instructions   [name]  500 MG chewable tablet Generic drug: sucroferric oxyhydroxide Chew 1-2 tablets (500-1,000 mg total) by mouth See admin instructions. Take 1000mg  (2 tablets) by mouth with meals and 500mg  (1 tablet) with snacks  Discharge Care Instructions  (From admission, onward)           Start     Ordered   11/12/23 0000  Discharge wound care:       Comments: Cleanse left hand with NS and pat dry. Apply silvadene  cream to open wounds and cover with nonadherent silicone mesh, Mepitel (LAWSON # [id number]).  Reapply each shift. Cover with gauze, kerlix and tape   11/12/23 1542            Discharge Exam: Filed Weights   11/12/23 0946 11/12/23 1322 11/13/23 0500  Weight: 70 kg 68 kg 68 kg        General:  AAO x 3,  cooperative, no distress;   HEENT:  Normocephalic, PERRL, otherwise with in Normal limits   Neuro:  CNII-XII intact. , normal motor and sensation, reflexes intact   Lungs:   Clear to auscultation BL, Respirations unlabored,  No wheezes / crackles  Cardio:    S1/S2, RRR, No murmure, No Rubs or Gallops   Abdomen:  Soft, non-tender, bowel sounds active all four quadrants, no guarding or peritoneal signs.  Muscular  skeletal:  Limited exam -global generalized weaknesses - in bed, able to move all 4 extremities,   2+ pulses,  symmetric, No pitting edema  Skin:  Dry, warm to touch, negative for any Rashes,  Wounds:            Wounds: Acute on chronic posterior hand/wrist wound -see  picture   Media Information  Document Information    Condition at discharge: good  The results of significant diagnostics from this hospitalization (including imaging, microbiology, ancillary and laboratory) are listed below for reference.   Imaging Studies: CT Head Wo Contrast Result Date: 11/10/2023 EXAM: CT HEAD WITHOUT CONTRAST 11/10/2023 10:52:18 AM TECHNIQUE: CT of the head was performed without the administration of intravenous contrast. Automated exposure control, iterative reconstruction, and/or weight based adjustment of the mA/kV was utilized to reduce the radiation dose to as low as reasonably achievable. COMPARISON: MRI of the head dated 12/17/2022. CLINICAL HISTORY: Mental status change, unknown cause. Pt arrived via [facility] from dialysis, from home, pt was A\T\O this morning 0645 am before Tx and become lethargic and zoned out around 0800 this morning and was slow to respond, pt states she feels a little sleepy but able to answer EMSs questions. FINDINGS: BRAIN AND VENTRICLES: No acute hemorrhage. Gray-white differentiation is preserved. No hydrocephalus. No extra-axial collection. No mass effect or midline shift. Chronic encephalomalacia changes present within the left parietal and occipital lobes. Mild periventricular white matter disease. ORBITS: No acute abnormality. SINUSES: No acute abnormality. SOFT TISSUES AND SKULL: No acute soft tissue abnormality. No skull fracture. Calcifications within the carotid siphons. IMPRESSION: 1. No acute intracranial abnormality. 2. Chronic encephalomalacia changes in the left parietal and occipital lobes. 3. Mild periventricular white matter disease. 4. Calcifications within the carotid siphons. Electronically signed by: Evalene Coho MD 11/10/2023 10:58 AM EDT RP Workstation: [id number]   DG Chest Portable 1 View Result Date: 11/10/2023 CLINICAL DATA:  Confusion.  Lethargy. EXAM: PORTABLE CHEST 1 VIEW COMPARISON:  10/27/2023. FINDINGS:  Stable cardiomegaly. Aortic atherosclerosis. Similar streaky left basilar opacities, favored to reflect atelectasis. The right lung is clear. No sizable pleural effusion or pneumothorax. Degenerative changes of the right glenohumeral joint. No acute osseous abnormality. IMPRESSION: 1. Similar streaky left basilar opacities, favored to reflect atelectasis. 2. Stable cardiomegaly. Electronically Signed   By: Harrietta Sherry M.D.   On: 11/10/2023 10:10  CT Hand Left Wo Contrast Addendum Date: 10/29/2023 ADDENDUM REPORT: 10/29/2023 12:32 ADDENDUM: These results were called by telephone at the time of interpretation on 10/29/2023 at 12:08 pm to provider JOSEPH ZAMMIT , who verbally acknowledged these results. Electronically Signed   By: Harrietta Sherry M.D.   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 Blisters on the posterior left hand. Pain. EXAM: CT OF THE LEFT HAND WITHOUT CONTRAST TECHNIQUE: Multidetector CT imaging of the left hand was performed according to the standard protocol. Multiplanar CT image reconstructions were also generated. RADIATION DOSE REDUCTION: This exam was performed according to the departmental dose-optimization program which includes automated exposure control, adjustment of the mA and/or kV according to patient size and/or use of iterative reconstruction technique. COMPARISON:  Left hand radiograph dated 11/10/2023. FINDINGS: Bones/Joint/Cartilage No acute fracture or dislocation. Status post amputation of the third proximal, middle, and distal phalanges. No evidence of acute osteolysis or erosive changes. Mild degenerative arthropathy throughout the hand. Ligaments Ligaments are suboptimally evaluated by CT. Soft tissue / Muscles and Tendons Blistering along the dorsal hand. Marked subcutaneous edema along the dorsal hand extending to the underlying extensor digitorum tendons with numerous scattered foci of soft  tissue gas. Surrounding fat stranding. No discrete loculated fluid collection. Peripheral vascular calcifications. IMPRESSION: 1. Blistering and marked subcutaneous edema with stranding along the dorsal hand extending to the underlying extensor digitorum tendons with numerous scattered foci of soft tissue gas. These findings are concerning for gas-forming soft tissue infection. No discrete loculated fluid collection. 2. No acute osseous abnormality. 3. Status post amputation of the third digit. Electronically Signed: By: Harrietta Sherry M.D. On: 10/29/2023 12:02   DG Hand Complete Left Result Date: 10/29/2023 CLINICAL DATA:  Pain. EXAM: LEFT HAND - COMPLETE 3+ VIEW COMPARISON:  None Available. FINDINGS: There is amputation of the middle finger at the level of metacarpophalangeal joint. No acute fracture or dislocation. No aggressive osseous lesion. 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 No periarticular osteopenia or bony erosions. No radiopaque foreign bodies. There is diffuse soft tissue swelling over the dorsum of the hand. There are few foci of air within the soft tissue. However, underlying bones appear intact. No focal bone erosions. IMPRESSION: 1. No acute osseous abnormality of the left hand. 2. Amputation of middle finger at the level of metacarpophalangeal joint. 3. There is diffuse soft tissue swelling over the dorsum of the hand. There are few foci of air within the soft tissue. However, underlying bones appear intact. No focal bone erosions. Electronically Signed   By: Ree Molt M.D.   On: 10/29/2023 10:09   US  LT UPPER EXTREM LTD SOFT TISSUE NON VASCULAR Result Date: 10/28/2023 CLINICAL DATA:  Swelling. Amputation of the left long finger on 09/29/2023. EXAM: ULTRASOUND left UPPER EXTREMITY LIMITED TECHNIQUE: Ultrasound examination of the upper extremity soft tissues was performed in the area  of clinical concern. COMPARISON:  None Available. FINDINGS: Targeted sonographic evaluation of the dorsal left hand corresponding to the area of clinical concern demonstrates diffuse subcutaneous edema and ill-defined fluid without discrete loculated fluid collection. Irregular small echogenic foci with areas of dirty shadowing in the subcutaneous soft tissues could reflect soft tissue air. These findings may be secondary to postsurgical change, however, soft tissue infection with cellulitis and possible soft tissue gas cannot be excluded. IMPRESSION: Targeted sonographic evaluation of the dorsal left hand corresponding to  the area of clinical concern demonstrates diffuse subcutaneous edema and ill-defined fluid without discrete loculated fluid collection. Irregular small echogenic foci with areas of dirty shadowing in the subcutaneous soft tissues could reflect soft tissue air. These findings may be secondary to postsurgical change, however, soft tissue infection with cellulitis and possible soft tissue gas cannot be excluded. Consider further evaluation with cross-sectional imaging such as contrast-enhanced CT or MRI. Electronically Signed   By: Harrietta Sherry M.D.   On: 10/28/2023 15:04   DG Chest Portable 1 View Result Date: 10/27/2023 CLINICAL DATA:  Shortness of breath. EXAM: PORTABLE CHEST 1 VIEW COMPARISON:  April 30, 2023. FINDINGS: Stable cardiomegaly. Right lung is clear. Minimal left basilar subsegmental atelectasis is noted. Bony thorax is unremarkable. IMPRESSION: Minimal left basilar subsegmental atelectasis. Electronically Signed   By: Lynwood Landy Raddle M.D.   On: 10/27/2023 12:08    Microbiology: Results for orders placed or performed during the hospital encounter of 11/10/23  Urine Culture     Status: None   Collection Time: 11/10/23 11:01 AM   Specimen: In/Out Cath Urine  Result Value Ref Range Status   Specimen Description   Final    IN/OUT CATH URINE Performed at [facility], [address], [place], [place] [address]    Special Requests   Final    NONE Performed at [facility], [address], [place], [place] [address]    Culture   Final    NO GROWTH Performed at [facility], [address] [place], [place] [address]    Report Status 11/11/2023 FINAL  Final  Culture, blood (Routine X 2) w Reflex to ID Panel     Status: None (Preliminary result)   Collection Time: 11/10/23  1:01 PM   Specimen: BLOOD  Result Value Ref Range Status   Specimen Description BLOOD BLOOD RIGHT ARM  Final   Special Requests   Final    Blood Culture adequate volume BOTTLES DRAWN AEROBIC AND ANAEROBIC   Culture   Final    NO GROWTH 2 DAYS Performed at [facility], [address] [place], [place] [address]    Report Status PENDING  Incomplete  Culture, blood (Routine X 2) w Reflex to ID Panel     Status: None (Preliminary result)   Collection Time: 11/10/23  1:01 PM   Specimen: BLOOD  Result Value Ref Range Status   Specimen Description BLOOD BLOOD RIGHT HAND  Final   Special Requests   Final    BOTTLES DRAWN AEROBIC ONLY Blood Culture results may not be optimal due to an inadequate volume of blood received in culture bottles   Culture   Final    NO GROWTH 2 DAYS Performed at [facility], [address], [place], [place] [address]    Report Status PENDING  Incomplete   *Note: Due to a large number of results and/or encounters for the requested time period, some results have not been displayed. A complete set of results can be found in Results Review.    Labs: CBC: Recent Labs  Lab 11/10/23 0955 11/11/23 0427 11/11/23 1241 11/12/23 0414 11/12/23 1351 11/13/23 0350  WBC 17.6* 16.3*  --  14.9*  --  13.2*  HGB 10.3* 8.7* 10.0* 8.5* 9.3* 8.9*  HCT 33.1* 28.9* 32.5* 28.2* 29.8* 28.9*  MCV 79.0* 80.3  --  80.1  --  80.5  PLT 381 352  --  404*  --  436*   Basic Metabolic Panel: Recent Labs  Lab  11/10/23 0955 11/11/23 0427 11/12/23 0414 11/13/23 0350   NA 137 138 138 138  K 3.5 3.8 4.1 4.1  CL 97* 100 101 99  CO2 27 28 27 30   GLUCOSE 73 99 111* 111*  BUN 21 29* 38* 22  CREATININE 2.81* 3.69* 4.68* 3.04*  CALCIUM  8.2* 7.9* 8.0* 8.0*  MG 1.9  --   --   --   PHOS 1.7*  --   --   --    Liver Function Tests: Recent Labs  Lab 11/10/23 0955  AST 19  ALT <5  ALKPHOS 102  BILITOT 0.4  PROT 6.2*  ALBUMIN  2.4*   CBG: Recent Labs  Lab 11/11/23 2136 11/12/23 0744 11/12/23 1718 11/12/23 2100 11/13/23 0719  GLUCAP 138* 119* 143* 187* 146*    Discharge time spent: greater than 30 minutes.  Signed: Adriana DELENA Grams, MD [facility] 11/13/2023

## 2023-11-13 NOTE — TOC Transition Note (Signed)
 Transition of Care [facility]) - Discharge Note   Patient Details  Name: Kathryn Beck MRN: [id number] Date of Birth: 08/12/1955  [facility] CM/SW Contact:  Nena LITTIE Coffee, RN Phone Number: 11/13/2023, 12:48 PM   Clinical Narrative:    Pt to dc home and resume services c/[facility]. Order in place. Cory c/[facility] notified of dc. AVS updated.   Final next level of care: Home w Home Health Services Barriers to Discharge: Barriers Resolved   Patient Goals and CMS Choice            Discharge Placement                       Discharge Plan and Services Additional resources added to the After Visit Summary for                              [facility] Agency: [facility]        Social Drivers of Health (SDOH) Interventions SDOH Screenings   Food Insecurity: Patient Declined (11/11/2023)  Housing: Low Risk  (11/08/2023)  Transportation Needs: No Transportation Needs (11/08/2023)  Utilities: Not At Risk (11/08/2023)  Alcohol Screen: Low Risk  (05/03/2023)  Depression (PHQ2-9): High Risk (08/26/2023)  Financial Resource Strain: Low Risk  (05/03/2023)  Physical Activity: Inactive (05/03/2023)  Social Connections: Moderately Isolated (10/29/2023)  Stress: No Stress Concern Present (05/03/2023)  Tobacco Use: Low Risk  (10/30/2023)  Health Literacy: Adequate Health Literacy (05/03/2023)  Recent Concern: Health Literacy - Inadequate Health Literacy (02/08/2023)     Readmission Risk Interventions    12/18/2022    3:14 PM 05/01/2022    3:54 PM  Readmission Risk Prevention Plan  Transportation Screening Complete Complete  HRI or Home Care Consult  Complete  Social Work Consult for Recovery Care Planning/Counseling  --  Palliative Care Screening  Not Applicable  Medication Review Oceanographer) Complete Complete  PCP or Specialist appointment within 3-5 days of discharge Not Complete   HRI or Home Care Consult Complete   SW Recovery  Care/Counseling Consult Complete   Palliative Care Screening Not Applicable   Skilled Nursing Facility Not Applicable

## 2023-11-13 NOTE — Discharge Instructions (Addendum)
*  Your home health services will resume with [facility]. [facility] has been notified of your discharge*

## 2023-11-15 ENCOUNTER — Telehealth: Payer: Self-pay

## 2023-11-15 DIAGNOSIS — N186 End stage renal disease: Secondary | ICD-10-CM | POA: Diagnosis not present

## 2023-11-15 DIAGNOSIS — Z992 Dependence on renal dialysis: Secondary | ICD-10-CM | POA: Diagnosis not present

## 2023-11-15 DIAGNOSIS — L03114 Cellulitis of left upper limb: Secondary | ICD-10-CM | POA: Diagnosis not present

## 2023-11-15 DIAGNOSIS — N2581 Secondary hyperparathyroidism of renal origin: Secondary | ICD-10-CM | POA: Diagnosis not present

## 2023-11-15 LAB — CULTURE, BLOOD (ROUTINE X 2)
Culture: NO GROWTH
Culture: NO GROWTH
Special Requests: ADEQUATE

## 2023-11-15 NOTE — Transitions of Care (Post Inpatient/ED Visit) (Signed)
 11/15/2023  Name: Kathryn Beck MRN: [id number] DOB: 02/11/1956  Today's TOC FU Call Status: Today's TOC FU Call Status:: Successful TOC FU Call Completed TOC FU Call Complete Date: 11/15/23 Patient's Name and Date of Birth confirmed.  Transition Care Management Follow-up Telephone Call Date of Discharge: 11/13/23 [facility] Type of Discharge: Inpatient Admission Primary Inpatient Discharge Diagnosis:: SIRS How have you been since you were released from the hospital?: Better Any questions or concerns?: No  Items Reviewed: Did you receive and understand the discharge instructions provided?: Yes Medications obtained,verified, and reconciled?: Yes (Medications Reviewed) Any new allergies since your discharge?: No Dietary orders reviewed?: Yes Type of Diet Ordered:: Renal Diabetic Diet Do you have support at home?: Yes People in Home [RPT]: child(ren), adult  Medications Reviewed Today: Medications Reviewed Today     Reviewed by Lavelle Charmaine NOVAK, LPN (Licensed Practical Nurse) on 11/15/23 at 1629  Med List Status: <None>   Medication Order Taking? Sig Documenting Provider Last Dose Status Informant  0.9 %  sodium chloride  infusion [id number]   Lockamy, Randi L, NP-C  Active   acetaminophen  (TYLENOL ) 500 MG tablet [id number] Yes Take 1,000 mg by mouth every 6 (six) hours as needed for mild pain (pain score 1-3). [provider]  Active Child, Pharmacy Records  amLODipine  (NORVASC ) 10 MG tablet [id number] Yes Take 0.5 tablets (5 mg total) by mouth daily. Pearlean Manus, MD  Active Child, Pharmacy Records           Med Note (WARD, CHUCK MATSU   Wed Nov 10, 2023  7:52 PM)    apixaban  (ELIQUIS ) 5 MG TABS tablet [id number] Yes Take 1 tablet (5 mg total) by mouth 2 (two) times daily. For Afib Pearlean Manus, MD  Active Child, Pharmacy Records  aspirin  81 MG chewable tablet [id number] Yes Chew 2 tablets (162 mg total) by mouth daily. Bevely Doffing, FNP   Active Child, Pharmacy Records  B Complex-C-Folic Acid  (RENA-VITE RX) 1 MG TABS [id number] Yes Take 1 tablet by mouth daily. [provider]  Active Child, Pharmacy Records           Med Note WORLEY STEPHANE GORMAN [place] Nov 19, 2022 11:34 AM)    busPIRone  (BUSPAR ) 5 MG tablet [id number] Yes TAKE ONE TABLET BY MOUTH TWICE DAILY Antonetta Rollene BRAVO, MD  Active Child, Pharmacy Records  calcitRIOL  (ROCALTROL ) 0.5 MCG capsule [id number] Yes Take 2 capsules (1 mcg total) by mouth every Monday, Wednesday, and Friday with hemodialysis. Caleen Burgess BROCKS, MD  Active Child, Pharmacy Records  ceFAZolin  (ANCEF ) 2-4 GM/100ML-% IVPB [id number] Yes Inject 100 mLs (2 g total) into the vein every Monday, Wednesday, and Friday with hemodialysis for 12 days. Last day of therapy 11/24/23 Manandhar, Sabina, MD  Active   cinacalcet  (SENSIPAR ) 30 MG tablet [id number] Yes Take 3 tablets (90 mg total) by mouth 3 (three) times a week.  Patient taking differently: Take 90 mg by mouth every Monday, Wednesday, and Friday.   Sherrill Cable [place], DO  Active Child, Pharmacy Records           Med Note DRENA CHUCK MATSU   Sat May 01, 2023 12:25 PM)    Darbepoetin Alfa  (ARANESP ) 40 MCG/0.4ML SOSY injection [id number] Yes Inject 0.4 mLs (40 mcg total) into the skin every Friday at 6 PM.  Patient taking differently: Inject 40 mcg into the vein every Friday at 6 PM.   Caleen Burgess BROCKS, MD  Active Child, Pharmacy Records  ezetimibe  (  ZETIA ) 10 MG tablet [id number] Yes Take 1 tablet (10 mg total) by mouth daily. Antonetta Rollene BRAVO, MD  Active Child, Pharmacy Records    Discontinued 05/28/11 1302 (Patient has not taken in last 30 days)     Discontinued 05/28/11 1302 (Change in therapy)   insulin  aspart (NOVOLOG ) 100 UNIT/ML injection [id number] Yes Inject 0-6 Units into the skin 3 (three) times daily with meals. CBG 70 - 120: 0 units  CBG 121 - 150: 0 units  CBG 151 - 200: 1 unit  CBG 201-250: 2 units  CBG 251-300: 3 units  CBG 301-350: 4 units   CBG 351-400: 5 units  CBG > 400: Give 10 units and call MD Shahmehdi, Seyed A, MD  Active   insulin  glargine, 2 Unit Dial , (TOUJEO  MAX SOLOSTAR) 300 UNIT/ML Solostar Pen [id number] Yes Inject 30 Units into the skin daily. May need to slowly increase the dose depending upon your blood sugar, follow-up with PCP Trixie File, MD  Active Child, Pharmacy Records           Med Note WORLEY, ALASKA S   Wed Oct 27, 2023  2:44 PM)    metoprolol  succinate (TOPROL -XL) 50 MG 24 hr tablet [id number] Yes TAKE (1) TABLET BY MOUTH DAILY WITH FOOD *TAKE AFTER DIALYSIS*  Patient taking differently: Take 50 mg by mouth every Tuesday, Thursday, Saturday, and Sunday. Non-dialysis days   Antonetta Rollene BRAVO, MD  Active Child, Pharmacy Records           Med Note [place], CHUCK KANDICE [place] Nov 10, 2023  3:02 PM)    sertraline  (ZOLOFT ) 50 MG tablet [id number]  TAKE ONE TABLET BY MOUTH ONCE DAILY  Patient not taking: Reported on 11/15/2023   Antonetta Rollene BRAVO, MD  Active Child, Pharmacy Records           Med Note [place], CHUCK KANDICE [place] Nov 10, 2023  7:59 PM) D/t Rx being prescribed by a [place] provider <30 days ago, cannot delete, pt's daughter stated pt was taking this but stopped d/t also taking Cymbalta    torsemide  (DEMADEX ) 100 MG tablet [id number] Yes Take 1 tablet (100 mg total) by mouth every Tuesday, Thursday, Saturday, and Sunday.  Patient taking differently: Take 100 mg by mouth every Tuesday, Thursday, Saturday, and Sunday. Non-dialysis days   Pearlean Manus, MD  Active Child, Pharmacy Records           Med Note [place], CHUCK KANDICE [place] Nov 10, 2023  7:52 PM)    [name]  500 MG chewable tablet [id number] Yes Chew 1-2 tablets (500-1,000 mg total) by mouth See admin instructions. Take 1000mg  (2 tablets) by mouth with meals and 500mg  (1 tablet) with snacks Antonetta Rollene BRAVO, MD  Active Child, Pharmacy Records  Med List Note (Ward, Angelica, CPhT 11/10/23 1958): Patient has dialysis on M-W-F, pt's daughter  handles pt's medications            Home Care and Equipment/Supplies: Were Home Health Services Ordered?: NA Were you able to get the equipment/medical supplies?: No  Functional Questionnaire: Do you need assistance with bathing/showering or dressing?: Yes Do you need assistance with meal preparation?: Yes Do you need assistance with eating?: No Do you have difficulty maintaining continence: No Do you need assistance with getting out of bed/getting out of a chair/moving?: No Do you have difficulty managing or taking your medications?: No  Follow up appointments reviewed: PCP Follow-up appointment confirmed?: Yes Date of PCP follow-up appointment?:  11/24/23 Follow-up Provider: Dr. Rollene Pesa Specialist [facility] Follow-up appointment confirmed?: Yes Date of Specialist follow-up appointment?: 11/25/23 Follow-Up Specialty Provider:: Infectious Disease Do you need transportation to your follow-up appointment?: No Do you understand care options if your condition(s) worsen?: Yes-patient verbalized understanding    SIGNATURE Charmaine Bloodgood, LPN [facility] Health Advisor  [facility] You Are. We Are. [facility] Direct Dial  [phone]

## 2023-11-16 ENCOUNTER — Telehealth: Payer: Self-pay

## 2023-11-16 ENCOUNTER — Ambulatory Visit (INDEPENDENT_AMBULATORY_CARE_PROVIDER_SITE_OTHER): Admitting: Orthopedic Surgery

## 2023-11-16 DIAGNOSIS — L089 Local infection of the skin and subcutaneous tissue, unspecified: Secondary | ICD-10-CM

## 2023-11-16 NOTE — Progress Notes (Signed)
   Kathryn Beck - 68 y.o. female MRN [id number]  Date of birth: 23-Dec-1955  Office Visit Note: Visit Date: 11/16/2023 PCP: Antonetta Rollene BRAVO, MD Referred by: Antonetta Rollene BRAVO, MD  Subjective:  HPI: Kathryn Beck is a 68 y.o. female who presents today for follow up 2 weeks status post left hand and wrist irrigation and debridement with drain placement for ongoing infection in the setting of prior ray resection for gangrenous digit.  She is on dialysis 3 times a week, poorly controlled diabetic, with history of prior BKA's previously seen by Dr. Harden.  Her pain remains controlled at the left wrist and hand.  She is seen today with her daughter at the bedside who is a Engineer, civil (consulting), she has been doing daily dressing changes.  She is also receiving IV antibiotics regularly with the dialysis.  Pertinent ROS were reviewed with the patient and found to be negative unless otherwise specified above in HPI.   Assessment & Plan: Visit Diagnoses:  1. Finger infection     Plan: Extensive discussion was had with the patient and her daughter today regarding the status of the left hand and wrist.  As previously seen, there is poor blood flow to this region at baseline.  Poor healing is demonstrated in these regions currently.  Drain was removed today, minimal purulence or active drainage is appreciated.  No evidence of significant erythema or pain on examination today.  She is still utilizing the cock up wrist splint.  I did explain that given the appearance of the left hand and wrist at this time, it is doubtful that she will demonstrate appropriate healing long-term.  We did discuss the ongoing demarcation process that we will likely play out over the coming weeks.  There likely will be a discussion of potential amputation at a certain point if she continues to demonstrate poor healing in these regions to prevent repeat infection given the poor vascularity.  Patient and daughter expressed full  understanding.  They will continue with daily dressing changes with Vashe for the time being, continue with IV outpatient antibiotics.  Follow-up in approximately 2 weeks for a wound check.  I did explain that should she experience more systemic symptoms to reach out to us  regarding potential timing of amputation.  Follow-up: No follow-ups on file.   Meds & Orders: No orders of the defined types were placed in this encounter.  No orders of the defined types were placed in this encounter.    Procedures: No procedures performed       Objective:   Vital Signs: There were no vitals taken for this visit.  Ortho Exam Left upper extremity: - Drain removed, there are longitudinal incisions to the dorsal aspect of the hand and wrist with sutures in place demonstrating slow healing, mild drainage is expressible in these regions, no significant erythema, dusky appearance to the dorsal aspect of the hand, minimal color is appreciated, poor sensation to the distal aspects of the digits, range of motion is limited secondary to pain at the wrist and hand level  Imaging: No results found.   Hristopher Missildine Afton [place]erton, M.D.  [facility]

## 2023-11-17 DIAGNOSIS — N186 End stage renal disease: Secondary | ICD-10-CM | POA: Diagnosis not present

## 2023-11-17 DIAGNOSIS — Z992 Dependence on renal dialysis: Secondary | ICD-10-CM | POA: Diagnosis not present

## 2023-11-17 DIAGNOSIS — N2581 Secondary hyperparathyroidism of renal origin: Secondary | ICD-10-CM | POA: Diagnosis not present

## 2023-11-17 DIAGNOSIS — L03114 Cellulitis of left upper limb: Secondary | ICD-10-CM | POA: Diagnosis not present

## 2023-11-18 ENCOUNTER — Other Ambulatory Visit: Payer: Self-pay

## 2023-11-18 DIAGNOSIS — I480 Paroxysmal atrial fibrillation: Secondary | ICD-10-CM | POA: Diagnosis not present

## 2023-11-18 DIAGNOSIS — I5032 Chronic diastolic (congestive) heart failure: Secondary | ICD-10-CM | POA: Diagnosis not present

## 2023-11-18 DIAGNOSIS — N2581 Secondary hyperparathyroidism of renal origin: Secondary | ICD-10-CM | POA: Diagnosis not present

## 2023-11-18 DIAGNOSIS — E1151 Type 2 diabetes mellitus with diabetic peripheral angiopathy without gangrene: Secondary | ICD-10-CM | POA: Diagnosis not present

## 2023-11-18 DIAGNOSIS — I132 Hypertensive heart and chronic kidney disease with heart failure and with stage 5 chronic kidney disease, or end stage renal disease: Secondary | ICD-10-CM | POA: Diagnosis not present

## 2023-11-18 DIAGNOSIS — L02512 Cutaneous abscess of left hand: Secondary | ICD-10-CM | POA: Diagnosis not present

## 2023-11-18 DIAGNOSIS — L03114 Cellulitis of left upper limb: Secondary | ICD-10-CM | POA: Diagnosis not present

## 2023-11-18 DIAGNOSIS — Z8673 Personal history of transient ischemic attack (TIA), and cerebral infarction without residual deficits: Secondary | ICD-10-CM | POA: Diagnosis not present

## 2023-11-18 DIAGNOSIS — J9811 Atelectasis: Secondary | ICD-10-CM | POA: Diagnosis not present

## 2023-11-18 DIAGNOSIS — G4733 Obstructive sleep apnea (adult) (pediatric): Secondary | ICD-10-CM | POA: Diagnosis not present

## 2023-11-18 DIAGNOSIS — K219 Gastro-esophageal reflux disease without esophagitis: Secondary | ICD-10-CM | POA: Diagnosis not present

## 2023-11-18 DIAGNOSIS — N186 End stage renal disease: Secondary | ICD-10-CM | POA: Diagnosis not present

## 2023-11-18 DIAGNOSIS — Z89511 Acquired absence of right leg below knee: Secondary | ICD-10-CM | POA: Diagnosis not present

## 2023-11-18 DIAGNOSIS — I350 Nonrheumatic aortic (valve) stenosis: Secondary | ICD-10-CM | POA: Diagnosis not present

## 2023-11-18 DIAGNOSIS — E1122 Type 2 diabetes mellitus with diabetic chronic kidney disease: Secondary | ICD-10-CM | POA: Diagnosis not present

## 2023-11-18 DIAGNOSIS — T8742 Infection of amputation stump, left upper extremity: Secondary | ICD-10-CM | POA: Diagnosis not present

## 2023-11-18 DIAGNOSIS — B9561 Methicillin susceptible Staphylococcus aureus infection as the cause of diseases classified elsewhere: Secondary | ICD-10-CM | POA: Diagnosis not present

## 2023-11-18 DIAGNOSIS — D631 Anemia in chronic kidney disease: Secondary | ICD-10-CM | POA: Diagnosis not present

## 2023-11-18 DIAGNOSIS — E782 Mixed hyperlipidemia: Secondary | ICD-10-CM | POA: Diagnosis not present

## 2023-11-18 DIAGNOSIS — I70203 Unspecified atherosclerosis of native arteries of extremities, bilateral legs: Secondary | ICD-10-CM | POA: Diagnosis not present

## 2023-11-18 NOTE — Patient Instructions (Signed)
 Visit Information  Thank you for taking time to visit with me today. Please don't hesitate to contact me if I can be of assistance to you before our next scheduled telephone appointment.  Our next appointment is by telephone on 11/25/23  at 1 pm  Following is a copy of your care plan:   Goals Addressed             This Visit's Progress    VBCI Transitions of Care (TOC) Care Plan   On track     Reviewed and or Updated with daughter on 11/18/23 TOC week 2 Attempt 2 completed.  Problems:  Recent Hospitalization for treatment of Abscess of Lt hand Knowledge Deficit Related to Abscess of left hand and Medication management barrier  two medications patient was taking before admitted was omitted from list. Patient daughter will call PCP and see if she wants her to take them.  (Resolved as of this call).   Goal:  Over the next 30 days, the patient will not experience hospital readmission  Interventions:  Transitions of Care:  Daughter states no unmet needs or new concerns on this call.  Transportation to HD was arranged via [facility] to [facility] [place] per daughter.  Reviewed medications/allergies/functional status/follow up appointments/review of systems/DME/Wound Care/home health.  Daughter reporting this call: denies patient complaints of chest pain or shortness of breath, patient mildly confused.  Doctor Visits  - discussed the importance of doctor visits Contacted Health RN/OT/PT - [facility] has been to see patient and ordered wound care supplies.  Wound care reviewed.  Contacted DME company for patient use of equipment  Patient daughter has put in a call to see when the hospital bed will be delivered.  Resolved.    Patient Self Care Activities: (Daughter Managing/assisting) Attend all scheduled provider appointments Call pharmacy for medication refills 3-7 days in advance of running out of medications Call provider office for new concerns or questions  Notify RN  Care Manager of TOC call rescheduling needs Participate in Transition of Care Program/Attend TOC scheduled calls Take medications as prescribed   LUE Continue to keep the drain in her hand ado do daily basic dressing changes . Use Vashe if possible.  Continue to monitor blood pressure per daughter assistance.    Plan:  Next PCP appointment scheduled for: [phone] Infectious disease Appointment [phone] Telephone follow up appointment with care management team member scheduled for: 11/25/23 1 pm.  Daughter will contact PCP regarding medications that are not on discharge list patient was taking before admission (completed/resolved).          Patient verbalizes understanding of instructions and care plan provided today and agrees to view in MyChart. Active MyChart status and patient understanding of how to access instructions and care plan via MyChart confirmed with patient.     Telephone follow up appointment with care management team member scheduled for: 11/25/23 at 1 pm  Please call the care guide team at [phone] if you need to cancel or reschedule your appointment.   Please call the Suicide and Crisis Lifeline: 988 call 1-800-273-TALK (toll free, 24 hour hotline) call 911 if you are experiencing a Mental Health or Behavioral Health Crisis or need someone to talk to.   Bing Edison MSN, RN RN Case Sales executive Health  VBCI-Population Health Office Hours M-F [phone] Direct Dial : [phone] Main Phone [phone]  Fax: [phone] [email or web address]

## 2023-11-18 NOTE — Transitions of Care (Post Inpatient/ED Visit) (Signed)
 Transition of Care week 2  Visit Note  11/18/2023  Name: Kathryn Beck MRN: [id number]          DOB: 1956-02-18  Situation: Patient enrolled in [facility] 30-day program. Visit completed with daughter  by telephone.   Background:   Initial Transition Care Management Follow-up Telephone Call    Past Medical History:  Diagnosis Date   Acid reflux    Amputated left leg (HCC)    bilateral BKA   Anemia    Arthritis    Axillary masses    Soft tissue - status post excision   Back pain    CHF (congestive heart failure) (HCC)    COVID-19 virus infection 04/06/2019   Depression    End-stage renal disease (HCC)    M/W/F dialysis   Essential hypertension    Headache    years ago   History of blood transfusion    History of cardiac catheterization    Normal coronary arteries October 2020   History of claustrophobia    History of pneumonia 2019   Hypoxia 04/03/2019   Memory loss    Mixed hyperlipidemia    Obesity    Pancreatitis    Peritoneal dialysis catheter in place [facility])    Pneumonia due to COVID-19 virus 04/02/2019   Sleep apnea    Noncompliant with CPAP   Stroke (HCC)    mini stroke   Type 2 diabetes mellitus (HCC)     Assessment: Patient Reported Symptoms: Cognitive Cognitive Status: Able to follow simple commands, Alert and oriented to person, place, and time, Confused or disoriented (Per daughter patient mostly oriented but has some confusion at times.)      Neurological Neurological Review of Symptoms: No symptoms reported    HEENT HEENT Symptoms Reported: No symptoms reported      Cardiovascular Cardiovascular Symptoms Reported: No symptoms reported Does patient have uncontrolled Hypertension?: No Cardiovascular Management Strategies: Medical device, Medication therapy, Routine screening Cardiovascular Self-Management Outcome: 4 (good) Cardiovascular Comment: Daughter takes blood pressure every day, weights are recorded at HD MWF.  Respiratory Respiratory  Symptoms Reported: No symptoms reported Additional Respiratory Details: Denies shorntess of breath by daughter, not on oxygen  Respiratory Management Strategies: Routine screening Respiratory Self-Management Outcome: 4 (good)  Endocrine Endocrine Symptoms Reported: No symptoms reported Is patient diabetic?: No    Gastrointestinal Gastrointestinal Symptoms Reported: No symptoms reported      Genitourinary Other Genitourinary Symptoms: ESRD Genitourinary Management Strategies: Hemodialysis Hemodialysis Schedule: W-W-F at [facility] [place] [facility] Last Treatment: 11/17/23 Genitourinary Self-Management Outcome: 3 (uncertain) Genitourinary Comment: Transporation arranged via [facility] [place] [facility] in [place]  Integumentary Integumentary Symptoms Reported: Wound Skin Management Strategies: Medication therapy, Dressing changes Skin Self-Management Outcome: 4 (good) Skin Comment: [facility] RN asseses and ordered dressing change supplies for paitent per daughter.  Musculoskeletal Musculoskelatal Symptoms Reviewed: Unsteady gait, Weakness, Limited mobility, Difficulty walking Musculoskeletal Management Strategies: Routine screening, Medical device Musculoskeletal Comment: Wheel chair, family assist, Hoyer lift. Falls in the past year?:  (non reported this call) Patient at Risk for Falls Due to: Impaired balance/gait, Impaired mobility, Medication side effect, Mental status change Fall risk Follow up: Falls prevention discussed  Psychosocial Psychosocial Symptoms Reported: No symptoms reported Additional Psychological Details: Per daughter reporting. Behavioral Management Strategies: Support system, Medication therapy   Quality of Family Relationships: involved, helpful Do you feel physically threatened by others?: No (per daughter)   Vitals:   11/18/23 1236  BP: 136/74    Medications Reviewed Today  Reviewed by Carolee Heron NOVAK, RN (Case Manager)  on 11/18/23 at 1229  Med List Status: <None>   Medication Order Taking? Sig Documenting Provider Last Dose Status Informant  0.9 %  sodium chloride  infusion [id number]   Lockamy, Randi L, NP-C  Active   acetaminophen  (TYLENOL ) 500 MG tablet [id number] Yes Take 1,000 mg by mouth every 6 (six) hours as needed for mild pain (pain score 1-3). [provider]  Active Child, Pharmacy Records  amLODipine  (NORVASC ) 10 MG tablet [id number] Yes Take 0.5 tablets (5 mg total) by mouth daily. Pearlean Manus, MD  Active Child, Pharmacy Records           Med Note (WARD, CHUCK KANDICE [place] Nov 10, 2023  7:52 PM)    apixaban  (ELIQUIS ) 5 MG TABS tablet [id number] Yes Take 1 tablet (5 mg total) by mouth 2 (two) times daily. For Afib Pearlean Manus, MD  Active Child, Pharmacy Records  aspirin  81 MG chewable tablet [id number] Yes Chew 2 tablets (162 mg total) by mouth daily. Bevely Doffing, FNP  Active Child, Pharmacy Records  B Complex-C-Folic Acid  (RENA-VITE RX) 1 MG TABS [id number] Yes Take 1 tablet by mouth daily. [provider]  Active Child, Pharmacy Records           Med Note WORLEY STEPHANE GORMAN [place] Nov 19, 2022 11:34 AM)    busPIRone  (BUSPAR ) 5 MG tablet [id number] Yes TAKE ONE TABLET BY MOUTH TWICE DAILY Antonetta Rollene BRAVO, MD  Active Child, Pharmacy Records  calcitRIOL  (ROCALTROL ) 0.5 MCG capsule [id number]  Take 2 capsules (1 mcg total) by mouth every Monday, Wednesday, and Friday with hemodialysis. Caleen Burgess BROCKS, MD  Active Child, Pharmacy Records           Med Note SYDELL, HERON NOVAK [place] Nov 18, 2023 12:22 PM) Patient receives this with HD MWF.   ceFAZolin  (ANCEF ) 2-4 GM/100ML-% IVPB [id number]  Inject 100 mLs (2 g total) into the vein every Monday, Wednesday, and Friday with hemodialysis for 12 days. Last day of therapy 11/24/23 Dea Shiner, MD  Active            Med Note SYDELL, HERON NOVAK [place] Nov 18, 2023 12:22 PM) Patient receives at HD MWF  cinacalcet  (SENSIPAR ) 30 MG tablet  [id number]  Take 3 tablets (90 mg total) by mouth 3 (three) times a week.  Patient taking differently: Take 90 mg by mouth every Monday, Wednesday, and Friday.   Sherrill Alejandro [place], DO  Active Child, Pharmacy Records           Med Note DRENA CHUCK KANDICE   Sat May 01, 2023 12:25 PM)    Darbepoetin Alfa  (ARANESP ) 40 MCG/0.4ML SOSY injection [id number] Yes Inject 0.4 mLs (40 mcg total) into the skin every Friday at 6 PM.  Patient taking differently: Inject 40 mcg into the vein every Friday at 6 PM.   Caleen Burgess BROCKS, MD  Active Child, Pharmacy Records  ezetimibe  (ZETIA ) 10 MG tablet [id number]  Take 1 tablet (10 mg total) by mouth daily. Antonetta Rollene BRAVO, MD  Active Child, Pharmacy Records    Discontinued 05/28/11 1302 (Patient has not taken in last 30 days)     Discontinued 05/28/11 1302 (Change in therapy)   insulin  aspart (NOVOLOG ) 100 UNIT/ML injection [id number] Yes Inject 0-6 Units into the skin 3 (three) times daily with meals. CBG 70 - 120: 0 units  CBG 121 - 150: 0 units  CBG 151 -  200: 1 unit  CBG 201-250: 2 units  CBG 251-300: 3 units  CBG 301-350: 4 units  CBG 351-400: 5 units  CBG > 400: Give 10 units and call MD Willette Adriana LABOR, MD  Active   insulin  glargine, 2 Unit Dial , (TOUJEO  MAX SOLOSTAR) 300 UNIT/ML Solostar Pen [id number] Yes Inject 30 Units into the skin daily. May need to slowly increase the dose depending upon your blood sugar, follow-up with PCP Trixie File, MD  Active Child, Pharmacy Records           Med Note WORLEY, ALASK[place] S   Wed Oct 27, 2023  2:44 PM)    metoprolol  succinate (TOPROL -XL) 50 MG 24 hr tablet [id number] Yes TAKE (1) TABLET BY MOUTH DAILY WITH FOOD *TAKE AFTER DIALYSIS*  Patient taking differently: Take 50 mg by mouth every Tuesday, Thursday, Saturday, and Sunday. Non-dialysis days   Antonetta Rollene BRAVO, MD  Active Child, Pharmacy Records           Med Note [place], CHUCK KANDICE [place] Nov 10, 2023  3:02 PM)    sertraline  (ZOLOFT ) 50 MG tablet  [id number]  TAKE ONE TABLET BY MOUTH ONCE DAILY  Patient not taking: Reported on 11/15/2023   Antonetta Rollene BRAVO, MD  Active Child, Pharmacy Records           Med Note SYDELL, HERON KATHEE [place] Nov 18, 2023 12:28 PM) Duloxetine  60 mg Capsules was discontinued stop taking on last medication list after discharge follow up 11/13/23.   torsemide  (DEMADEX ) 100 MG tablet [id number] Yes Take 1 tablet (100 mg total) by mouth every Tuesday, Thursday, Saturday, and Sunday.  Patient taking differently: Take 100 mg by mouth every Tuesday, Thursday, Saturday, and Sunday. Non-dialysis days   Pearlean Manus, MD  Active Child, Pharmacy Records           Med Note [place], CHUCK KANDICE [place] Nov 10, 2023  7:52 PM)    [name]  500 MG chewable tablet [id number] Yes Chew 1-2 tablets (500-1,000 mg total) by mouth See admin instructions. Take 1000mg  (2 tablets) by mouth with meals and 500mg  (1 tablet) with snacks Antonetta Rollene BRAVO, MD  Active Child, Pharmacy Records  Med List Note (Ward, Angelica, CPhT 11/10/23 1958): Patient has dialysis on M-W-F, pt's daughter handles pt's medications           11/18/23: TOC RN CM Week 2 attempt #2 call completed with patient's daughter reporting on call.   The patient has been provided with contact information for the care management team and has been advised to call with any health-related questions or concerns. The daughter verbalized understanding with current POC. The patient/family is directed to their insurance card regarding availability of benefits coverage    Recommendation:   Continue Current Plan of Care  Follow Up Plan:   Telephone follow up appointment date/time:  11/25/23 at 1 pm   Bing Edison MSN, RN RN Case Manager [place]  VBCI-Population Health Office Hours M-F [phone] Direct Dial : [phone] Main Phone [phone]  Fax: [phone] [email or web address]

## 2023-11-19 DIAGNOSIS — Z992 Dependence on renal dialysis: Secondary | ICD-10-CM | POA: Diagnosis not present

## 2023-11-19 DIAGNOSIS — N2581 Secondary hyperparathyroidism of renal origin: Secondary | ICD-10-CM | POA: Diagnosis not present

## 2023-11-19 DIAGNOSIS — L03114 Cellulitis of left upper limb: Secondary | ICD-10-CM | POA: Diagnosis not present

## 2023-11-19 DIAGNOSIS — N186 End stage renal disease: Secondary | ICD-10-CM | POA: Diagnosis not present

## 2023-11-21 DIAGNOSIS — Z992 Dependence on renal dialysis: Secondary | ICD-10-CM | POA: Diagnosis not present

## 2023-11-21 DIAGNOSIS — N186 End stage renal disease: Secondary | ICD-10-CM | POA: Diagnosis not present

## 2023-11-22 DIAGNOSIS — L03114 Cellulitis of left upper limb: Secondary | ICD-10-CM | POA: Diagnosis not present

## 2023-11-22 DIAGNOSIS — Z992 Dependence on renal dialysis: Secondary | ICD-10-CM | POA: Diagnosis not present

## 2023-11-22 DIAGNOSIS — N186 End stage renal disease: Secondary | ICD-10-CM | POA: Diagnosis not present

## 2023-11-22 DIAGNOSIS — N2581 Secondary hyperparathyroidism of renal origin: Secondary | ICD-10-CM | POA: Diagnosis not present

## 2023-11-23 DIAGNOSIS — I480 Paroxysmal atrial fibrillation: Secondary | ICD-10-CM | POA: Diagnosis not present

## 2023-11-23 DIAGNOSIS — B9561 Methicillin susceptible Staphylococcus aureus infection as the cause of diseases classified elsewhere: Secondary | ICD-10-CM | POA: Diagnosis not present

## 2023-11-23 DIAGNOSIS — L03114 Cellulitis of left upper limb: Secondary | ICD-10-CM | POA: Diagnosis not present

## 2023-11-23 DIAGNOSIS — N2581 Secondary hyperparathyroidism of renal origin: Secondary | ICD-10-CM | POA: Diagnosis not present

## 2023-11-23 DIAGNOSIS — I350 Nonrheumatic aortic (valve) stenosis: Secondary | ICD-10-CM | POA: Diagnosis not present

## 2023-11-23 DIAGNOSIS — Z8673 Personal history of transient ischemic attack (TIA), and cerebral infarction without residual deficits: Secondary | ICD-10-CM | POA: Diagnosis not present

## 2023-11-23 DIAGNOSIS — I132 Hypertensive heart and chronic kidney disease with heart failure and with stage 5 chronic kidney disease, or end stage renal disease: Secondary | ICD-10-CM | POA: Diagnosis not present

## 2023-11-23 DIAGNOSIS — J9811 Atelectasis: Secondary | ICD-10-CM | POA: Diagnosis not present

## 2023-11-23 DIAGNOSIS — E1122 Type 2 diabetes mellitus with diabetic chronic kidney disease: Secondary | ICD-10-CM | POA: Diagnosis not present

## 2023-11-23 DIAGNOSIS — T8742 Infection of amputation stump, left upper extremity: Secondary | ICD-10-CM | POA: Diagnosis not present

## 2023-11-23 DIAGNOSIS — I70203 Unspecified atherosclerosis of native arteries of extremities, bilateral legs: Secondary | ICD-10-CM | POA: Diagnosis not present

## 2023-11-23 DIAGNOSIS — G4733 Obstructive sleep apnea (adult) (pediatric): Secondary | ICD-10-CM | POA: Diagnosis not present

## 2023-11-23 DIAGNOSIS — I5032 Chronic diastolic (congestive) heart failure: Secondary | ICD-10-CM | POA: Diagnosis not present

## 2023-11-23 DIAGNOSIS — L02512 Cutaneous abscess of left hand: Secondary | ICD-10-CM | POA: Diagnosis not present

## 2023-11-23 DIAGNOSIS — E782 Mixed hyperlipidemia: Secondary | ICD-10-CM | POA: Diagnosis not present

## 2023-11-23 DIAGNOSIS — K219 Gastro-esophageal reflux disease without esophagitis: Secondary | ICD-10-CM | POA: Diagnosis not present

## 2023-11-23 DIAGNOSIS — Z89511 Acquired absence of right leg below knee: Secondary | ICD-10-CM | POA: Diagnosis not present

## 2023-11-23 DIAGNOSIS — D631 Anemia in chronic kidney disease: Secondary | ICD-10-CM | POA: Diagnosis not present

## 2023-11-23 DIAGNOSIS — E1151 Type 2 diabetes mellitus with diabetic peripheral angiopathy without gangrene: Secondary | ICD-10-CM | POA: Diagnosis not present

## 2023-11-23 DIAGNOSIS — N186 End stage renal disease: Secondary | ICD-10-CM | POA: Diagnosis not present

## 2023-11-24 ENCOUNTER — Other Ambulatory Visit: Payer: Self-pay | Admitting: Family Medicine

## 2023-11-24 ENCOUNTER — Ambulatory Visit: Admitting: Family Medicine

## 2023-11-24 ENCOUNTER — Telehealth: Payer: Self-pay | Admitting: Family Medicine

## 2023-11-24 VITALS — BP 145/65 | HR 64 | Resp 16 | Ht 65.0 in

## 2023-11-24 DIAGNOSIS — Z23 Encounter for immunization: Secondary | ICD-10-CM

## 2023-11-24 DIAGNOSIS — E1165 Type 2 diabetes mellitus with hyperglycemia: Secondary | ICD-10-CM

## 2023-11-24 DIAGNOSIS — I96 Gangrene, not elsewhere classified: Secondary | ICD-10-CM | POA: Diagnosis not present

## 2023-11-24 DIAGNOSIS — I150 Renovascular hypertension: Secondary | ICD-10-CM | POA: Diagnosis not present

## 2023-11-24 DIAGNOSIS — N2581 Secondary hyperparathyroidism of renal origin: Secondary | ICD-10-CM | POA: Diagnosis not present

## 2023-11-24 DIAGNOSIS — F32A Depression, unspecified: Secondary | ICD-10-CM

## 2023-11-24 DIAGNOSIS — F411 Generalized anxiety disorder: Secondary | ICD-10-CM

## 2023-11-24 DIAGNOSIS — F321 Major depressive disorder, single episode, moderate: Secondary | ICD-10-CM

## 2023-11-24 DIAGNOSIS — N186 End stage renal disease: Secondary | ICD-10-CM | POA: Diagnosis not present

## 2023-11-24 DIAGNOSIS — Z09 Encounter for follow-up examination after completed treatment for conditions other than malignant neoplasm: Secondary | ICD-10-CM | POA: Diagnosis not present

## 2023-11-24 DIAGNOSIS — L03114 Cellulitis of left upper limb: Secondary | ICD-10-CM | POA: Diagnosis not present

## 2023-11-24 DIAGNOSIS — Z794 Long term (current) use of insulin: Secondary | ICD-10-CM | POA: Diagnosis not present

## 2023-11-24 DIAGNOSIS — D649 Anemia, unspecified: Secondary | ICD-10-CM | POA: Diagnosis not present

## 2023-11-24 DIAGNOSIS — Z992 Dependence on renal dialysis: Secondary | ICD-10-CM | POA: Diagnosis not present

## 2023-11-24 MED ORDER — SERTRALINE HCL 50 MG PO TABS
ORAL_TABLET | ORAL | 3 refills | Status: DC
Start: 2023-11-24 — End: 2024-01-02

## 2023-11-24 MED ORDER — BUSPIRONE HCL 7.5 MG PO TABS: 7.5000 mg | ORAL_TABLET | Freq: Two times a day (BID) | ORAL | 3 refills | Status: DC

## 2023-11-24 NOTE — Telephone Encounter (Signed)
 Patient advised to follow up in 3 months but I do not have anything until almost 5 months out. Patient wanted me to reach out and see where she can be worked in on a Tuesday or Thursday. Thanks

## 2023-11-24 NOTE — Telephone Encounter (Signed)
 Patient has dialysis on Wednesdays says it was a hassle to get her here today- can we squeeze her in on Tuesday or Thursday?

## 2023-11-24 NOTE — Patient Instructions (Addendum)
 F/U in 3 months re evaluate  DEPRESSION AND ANXIETY  fLU VACCINE TODAY  I will f/u refer you to GI per hospital d/c summary   Cbc AND DIFF THIS WEEK PLEASE  INCREASE zoloft  to 1.5 tabs daily  Increased dose buspar  to 7.5 mg twice daily  Let us  know when you are ready for in home PT for assistance with use of lower ext prostheses  Thanks for choosing [facility], we consider it a privelige to serve you.

## 2023-11-25 ENCOUNTER — Other Ambulatory Visit: Payer: Self-pay

## 2023-11-25 ENCOUNTER — Ambulatory Visit (INDEPENDENT_AMBULATORY_CARE_PROVIDER_SITE_OTHER): Admitting: Internal Medicine

## 2023-11-25 ENCOUNTER — Telehealth: Payer: Self-pay

## 2023-11-25 VITALS — BP 144/73 | HR 77 | Temp 98.4°F | Resp 16

## 2023-11-25 DIAGNOSIS — E1151 Type 2 diabetes mellitus with diabetic peripheral angiopathy without gangrene: Secondary | ICD-10-CM | POA: Diagnosis not present

## 2023-11-25 DIAGNOSIS — G4733 Obstructive sleep apnea (adult) (pediatric): Secondary | ICD-10-CM | POA: Diagnosis not present

## 2023-11-25 DIAGNOSIS — I96 Gangrene, not elsewhere classified: Secondary | ICD-10-CM | POA: Diagnosis not present

## 2023-11-25 DIAGNOSIS — Z89511 Acquired absence of right leg below knee: Secondary | ICD-10-CM | POA: Diagnosis not present

## 2023-11-25 DIAGNOSIS — I480 Paroxysmal atrial fibrillation: Secondary | ICD-10-CM | POA: Diagnosis not present

## 2023-11-25 DIAGNOSIS — I70203 Unspecified atherosclerosis of native arteries of extremities, bilateral legs: Secondary | ICD-10-CM | POA: Diagnosis not present

## 2023-11-25 DIAGNOSIS — N186 End stage renal disease: Secondary | ICD-10-CM | POA: Diagnosis not present

## 2023-11-25 DIAGNOSIS — I350 Nonrheumatic aortic (valve) stenosis: Secondary | ICD-10-CM | POA: Diagnosis not present

## 2023-11-25 DIAGNOSIS — I5032 Chronic diastolic (congestive) heart failure: Secondary | ICD-10-CM | POA: Diagnosis not present

## 2023-11-25 DIAGNOSIS — T8742 Infection of amputation stump, left upper extremity: Secondary | ICD-10-CM | POA: Diagnosis not present

## 2023-11-25 DIAGNOSIS — L03114 Cellulitis of left upper limb: Secondary | ICD-10-CM | POA: Diagnosis not present

## 2023-11-25 DIAGNOSIS — D631 Anemia in chronic kidney disease: Secondary | ICD-10-CM | POA: Diagnosis not present

## 2023-11-25 DIAGNOSIS — J9811 Atelectasis: Secondary | ICD-10-CM | POA: Diagnosis not present

## 2023-11-25 DIAGNOSIS — N2581 Secondary hyperparathyroidism of renal origin: Secondary | ICD-10-CM | POA: Diagnosis not present

## 2023-11-25 DIAGNOSIS — L02512 Cutaneous abscess of left hand: Secondary | ICD-10-CM | POA: Diagnosis not present

## 2023-11-25 DIAGNOSIS — B9561 Methicillin susceptible Staphylococcus aureus infection as the cause of diseases classified elsewhere: Secondary | ICD-10-CM | POA: Diagnosis not present

## 2023-11-25 DIAGNOSIS — S88912A Complete traumatic amputation of left lower leg, level unspecified, initial encounter: Secondary | ICD-10-CM | POA: Diagnosis not present

## 2023-11-25 DIAGNOSIS — E1122 Type 2 diabetes mellitus with diabetic chronic kidney disease: Secondary | ICD-10-CM | POA: Diagnosis not present

## 2023-11-25 DIAGNOSIS — E782 Mixed hyperlipidemia: Secondary | ICD-10-CM | POA: Diagnosis not present

## 2023-11-25 DIAGNOSIS — I132 Hypertensive heart and chronic kidney disease with heart failure and with stage 5 chronic kidney disease, or end stage renal disease: Secondary | ICD-10-CM | POA: Diagnosis not present

## 2023-11-25 DIAGNOSIS — K219 Gastro-esophageal reflux disease without esophagitis: Secondary | ICD-10-CM | POA: Diagnosis not present

## 2023-11-25 DIAGNOSIS — Z8673 Personal history of transient ischemic attack (TIA), and cerebral infarction without residual deficits: Secondary | ICD-10-CM | POA: Diagnosis not present

## 2023-11-25 NOTE — Telephone Encounter (Signed)
 Spoke to [employer] at [facility]. Verbal orders given per Dr.Snider to extend IV Cefazolin  2gm with HD until 12/24/2023.    MD orders faxed to [facility] as well. Fax # [phone].  Kathryn Beck Kathryn Beck, CMA

## 2023-11-25 NOTE — Progress Notes (Signed)
 RFV: follow up for MSSA SSTI of left hand Patient ID: Kathryn Beck, female   DOB: 09/07/1955, 69 y.o.   MRN: [id number]  HPI 68yo F with Left hand mssa ssti, possilbe osteomyelitis. Poorly healing. Saw dr colman who thinks she is headed towards amputation for source control if not improving. Patient still continues on receiving cefazolin  with hemodialysis. On exam still looks like dry gangrene.  Outpatient Encounter Medications as of 11/25/2023  Medication Sig   acetaminophen  (TYLENOL ) 500 MG tablet Take 1,000 mg by mouth every 6 (six) hours as needed for mild pain (pain score 1-3).   amLODipine  (NORVASC ) 10 MG tablet Take 0.5 tablets (5 mg total) by mouth daily.   apixaban  (ELIQUIS ) 5 MG TABS tablet Take 1 tablet (5 mg total) by mouth 2 (two) times daily. For Afib   aspirin  81 MG chewable tablet Chew 2 tablets (162 mg total) by mouth daily.   B Complex-C-Folic Acid  (RENA-VITE RX) 1 MG TABS Take 1 tablet by mouth daily.   busPIRone  (BUSPAR ) 7.5 MG tablet Take 1 tablet (7.5 mg total) by mouth 2 (two) times daily.   calcitRIOL  (ROCALTROL ) 0.5 MCG capsule Take 2 capsules (1 mcg total) by mouth every Monday, Wednesday, and Friday with hemodialysis.   cinacalcet  (SENSIPAR ) 30 MG tablet Take 3 tablets (90 mg total) by mouth 3 (three) times a week. (Patient taking differently: Take 90 mg by mouth every Monday, Wednesday, and Friday.)   Darbepoetin Alfa  (ARANESP ) 40 MCG/0.4ML SOSY injection Inject 0.4 mLs (40 mcg total) into the skin every Friday at 6 PM. (Patient taking differently: Inject 40 mcg into the vein every Friday at 6 PM.)   ezetimibe  (ZETIA ) 10 MG tablet Take 1 tablet (10 mg total) by mouth daily.   insulin  aspart (NOVOLOG ) 100 UNIT/ML injection Inject 0-6 Units into the skin 3 (three) times daily with meals. CBG 70 - 120: 0 units  CBG 121 - 150: 0 units  CBG 151 - 200: 1 unit  CBG 201-250: 2 units  CBG 251-300: 3 units  CBG 301-350: 4 units  CBG 351-400: 5 units  CBG > 400: Give  10 units and call MD   insulin  glargine, 2 Unit Dial , (TOUJEO  MAX SOLOSTAR) 300 UNIT/ML Solostar Pen Inject 30 Units into the skin daily. May need to slowly increase the dose depending upon your blood sugar, follow-up with PCP   metoprolol  succinate (TOPROL -XL) 50 MG 24 hr tablet TAKE (1) TABLET BY MOUTH DAILY WITH FOOD *TAKE AFTER DIALYSIS* (Patient taking differently: Take 50 mg by mouth every Tuesday, Thursday, Saturday, and Sunday. Non-dialysis days)   sertraline  (ZOLOFT ) 50 MG tablet Take one and a half tablets once daily   torsemide  (DEMADEX ) 100 MG tablet Take 1 tablet (100 mg total) by mouth every Tuesday, Thursday, Saturday, and Sunday. (Patient taking differently: Take 100 mg by mouth every Tuesday, Thursday, Saturday, and Sunday. Non-dialysis days)   [name]  500 MG chewable tablet Chew 1-2 tablets (500-1,000 mg total) by mouth See admin instructions. Take 1000mg  (2 tablets) by mouth with meals and 500mg  (1 tablet) with snacks   [DISCONTINUED] FLUoxetine (PROZAC) 10 MG capsule Take 10 mg by mouth daily.     [DISCONTINUED] glipiZIDE  (GLUCOTROL ) 10 MG tablet Take 10 mg by mouth 2 (two) times daily before a meal.     Facility-Administered Encounter Medications as of 11/25/2023  Medication   0.9 %  sodium chloride  infusion     Patient Active Problem List   Diagnosis Date Noted  SIRS (systemic inflammatory response syndrome) (HCC) 11/10/2023   Abscess of left hand 10/29/2023   Afib (HCC) 10/27/2023   Mass of breast 08/30/2023   Depression, major, single episode, moderate (HCC) 08/30/2023   GAD (generalized anxiety disorder) 08/30/2023   Decreased strength, endurance, and mobility 08/30/2023   Gangrene of finger of left hand (HCC) 08/30/2023   Uncontrolled type 2 diabetes mellitus with hyperglycemia (HCC) 08/30/2023   Hearing loss 04/27/2023   Otalgia of both ears 04/27/2023   CVA (cerebral vascular accident) (HCC) 12/17/2022   Acute confusion 12/10/2022   Generalized weakness  11/18/2022   Paronychia of finger of right hand 11/13/2022   Dehiscence of amputation stump of left lower extremity (HCC) 10/07/2022   Status post below-knee amputation of left lower extremity (HCC) 10/07/2022   Subacute osteomyelitis, left ankle and foot (HCC) 09/03/2022   Left foot infection 09/03/2022   Amputated left leg (HCC) 09/03/2022   Wound infection after surgery 09/03/2022   Equinus contracture of left ankle 08/15/2022   Acute osteomyelitis of toe of left foot (HCC) 08/13/2022   Peripheral vascular complication 07/21/2022   PVD (peripheral vascular disease) (HCC) 07/18/2022   Toe ulcer, left, limited to breakdown of skin (HCC) 07/13/2022   S/P BKA (below knee amputation), right (HCC) 05/29/2022   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   Gangrene of right foot (HCC) 04/25/2022   PAD (peripheral artery disease) (HCC) 04/25/2022   Other osteoporosis without current pathological fracture 11/10/2021   Hypocalcemia 11/10/2021   Low back pain with left-sided sciatica 10/31/2021   Left ear impacted cerumen 09/18/2021   Morbid obesity (HCC) 03/10/2021   Subungual hematoma of second toe of left foot 10/28/2020   Insomnia 10/28/2020   Memory loss or impairment 09/26/2020   Abnormal MRI, cervical spine 03/04/2020   Right leg weakness 02/28/2020   Knee pain, right 10/23/2019   Carpal tunnel syndrome 06/13/2019   Chronic pain syndrome 06/13/2019   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) 06/13/2019   ESRD on hemodialysis (HCC) 11/22/2018   Ischemic heart disease 09/25/2018   Not currently working due to disabled status 06/13/2018   HTN (hypertension) 06/12/2018   Adrenal mass, left (HCC) 11/09/2017   MGUS (monoclonal gammopathy of unknown significance) 11/05/2017   Chronic diastolic (congestive) heart failure (HCC) 09/20/2017   Bilateral leg weakness 09/09/2017   Adrenal adenoma 09/03/2017   Anemia of chronic disease 03/24/2017   History of colonic  polyps 02/10/2017   Dysphagia 11/05/2016   Irritable bowel syndrome 02/04/2016   Cardiac murmur 01/17/2014   Back pain with left-sided radiculopathy 09/18/2013   Other seasonal allergic rhinitis 08/22/2012   OSA (obstructive sleep apnea) 06/02/2011   Neuropathy 04/16/2010   LUPUS ERYTHEMATOSUS, DISCOID 06/05/2008   Hyperlipidemia 06/07/2007   Obesity 06/07/2007   Type 2 diabetes mellitus with hyperglycemia (HCC) 06/07/2007   Diabetes mellitus (HCC) 06/07/2007     Health Maintenance Due  Topic Date Due   Zoster Vaccines- Shingrix  (2 of 2) 02/18/2017   COVID-19 Vaccine (3 - Moderna risk series) 05/28/2020   OPHTHALMOLOGY EXAM  07/25/2022   Medicare Annual Wellness (AWV)  11/07/2022   MAMMOGRAM  06/13/2023     Review of Systems 12 point ros is negative except for left hand poorly healing Physical Exam   BP (!) 144/73   Pulse 77   Temp 98.4 F (36.9 C) (Oral)   Resp 16   SpO2 98%    Physical Exam  Constitutional:  oriented to person,  place, and time. appears well-developed and well-nourished. No distress.  HENT: [place]/AT, PERRLA, no scleral icterus Mouth/Throat: Oropharynx is clear and moist. No oropharyngeal exudate.  Cardiovascular: Normal rate, regular rhythm and normal heart sounds. Exam reveals no gallop and no friction rub.  No murmur heard.  Pulmonary/Chest: Effort normal and breath sounds normal. No respiratory distress.  has no wheezes.  Ext: left hand dry gangrene. Thrill+ to left upper arm Psychiatric: a normal mood and affect.  behavior is normal.    CBC Lab Results  Component Value Date   WBC 13.2 (H) 11/13/2023   RBC 3.59 (L) 11/13/2023   HGB 8.9 (L) 11/13/2023   HCT 28.9 (L) 11/13/2023   PLT 436 (H) 11/13/2023   MCV 80.5 11/13/2023   MCH 24.8 (L) 11/13/2023   MCHC 30.8 11/13/2023   RDW 20.5 (H) 11/13/2023   LYMPHSABS 1.0 11/03/2023   MONOABS 1.0 11/03/2023   EOSABS 0.1 11/03/2023    BMET Lab Results  Component Value Date   NA 138 11/13/2023    K 4.1 11/13/2023   CL 99 11/13/2023   CO2 30 11/13/2023   GLUCOSE 111 (H) 11/13/2023   BUN 22 11/13/2023   CREATININE 3.04 (H) 11/13/2023   CALCIUM  8.0 (L) 11/13/2023   GFRNONAA 16 (L) 11/13/2023   GFRAA 8 (L) 07/19/2019      Assessment and Plan Left hand dry gangrene with MSSA infection, Will extend cefazolin  2gm iv with hd at [facility] in [place] x 4 wk from today or can shorten if she goes to have amputation  I have personally spent 20 minutes involved in face-to-face and non-face-to-face activities for this patient on the day of the visit. Professional time spent includes the following activities: Preparing to see the patient (review of tests), Obtaining and/or reviewing separately obtained history (admission/discharge record), Performing a medically appropriate examination and/or evaluation , Ordering medications/tests/procedures, referring and communicating with other health care professionals, Documenting clinical information in the EMR, Independently interpreting results (not separately reported), Communicating results to the patient/family/caregiver, Counseling and educating the patient/family/caregiver and Care coordination (not separately reported).

## 2023-11-25 NOTE — Telephone Encounter (Signed)
 Done

## 2023-11-25 NOTE — Patient Instructions (Signed)
 Visit Information  Thank you for taking time to visit with me today. Please don't hesitate to contact me if I can be of assistance to you before our next scheduled telephone appointment.  Our next appointment is by telephone on 12/02/23 at 2 pm  Following is a copy of your care plan:   Goals Addressed             This Visit's Progress    VBCI Transitions of Care (TOC) Care Plan   On track     Reviewed and or Updated with daughter on 11/25/23/25 TOC week 3 with daughter as informant, not with patient,  completed.   Problems:  Recent Hospitalization for treatment of Abscess of Lt hand Knowledge Deficit Related to Abscess of left hand and Medication management barrier  two medications patient was taking before admitted was omitted from list. Patient daughter will call PCP and see if she wants her to take them.  (Resolved as of this call).  PCP HFU completed on 11/24/23.  Ortho follow up for hand evaluation on 11/30/23 Infectious disease follow up 11/25/23 at 330 pm.   Goal:  Over the next 30 days, the patient will not experience hospital readmission  Interventions:  Transitions of Care:  Daughter states no unmet needs or new concerns for patient on this call.  Transportation to HD was arranged via [facility] to [facility] per daughter.  Reviewed medications/allergies/functional status/follow up appointments/review of systems/DME/Wound Care/home health.  Daughter reporting this call: denies patient complaints of chest pain or shortness of breath, patient mildly confused.  Daughter reports overall improvement and that she is doing a lot better, participating with PT in home, less confusion, and doing more.  PCP HFU completed on 11/24/23 with medications changes noted and reviewed with daughter.  Doctor Visits  - discussed the importance of doctor visits' Reviewed follow up visits coming up with specialists.  Contacted Health RN/OT/PT - Hedda has been to see patient and  ordered wound care supplies. (Ongoing) Wound care reviewed.  Ortho and ID follow up pending.  PT coming to see patient and patient is participating with PT, per daughter.  Contacted DME company for patient use of equipment  Patient daughter has put in a call to see when the hospital bed will be delivered.  Resolved.    Patient Self Care Activities: (Daughter Managing/assisting) Attend all scheduled provider appointments Call pharmacy for medication refills 3-7 days in advance of running out of medications Call provider office for new concerns or questions  Notify RN Care Manager of TOC call rescheduling needs Participate in Transition of Care Program/Attend TOC scheduled calls Take medications as prescribed   LUE Continue to keep the drain in her hand and do daily basic dressing changes . Use Vashe if possible. (Per prior notes).  Continue to monitor blood pressure and blood sugars per daughter assistance.  Weight monitored via HD on MWF.    Plan:  Next PCP appointment scheduled for: [id number] Infectious disease Appointment [id number]  Telephone follow up appointment with care management team member scheduled for: 12/02/23 1 pm.  Continue with plan of care as discussed and reviewed.  The patient (Or contact of daughter as informant) has been provided with contact information for the care management team and has been advised to call with any health-related questions or concerns.  The patient verbalized understanding with current POC.  The patient is directed to their insurance card regarding availability of benefits coverage     Bing Edison  MSN, RN RN Case Manager [place]  [facility]-Population Health Office Hours M-F [id number] Direct Dial : [phone] Main Phone [phone]  Fax: [phone] [email or web address]            Patient verbalizes understanding of instructions and care plan provided today and agrees to view in MyChart. Active MyChart status and patient  understanding of how to access instructions and care plan via MyChart confirmed with patient.     Telephone follow up appointment with care management team member scheduled for: 12/02/23 at 2 pm.   Please call the care guide team at [phone] if you need to cancel or reschedule your appointment.   Please call the Suicide and Crisis Lifeline: 988 call 1-800-273-TALK (toll free, 24 hour hotline) call 911 if you are experiencing a Mental Health or Behavioral Health Crisis or need someone to talk to.   Bing Edison MSN, RN RN Case Sales executive Health  [facility]Population Health Office Hours M-F [phone] Direct Dial : [phone] Main Phone [phone]  Fax: [phone] [email or web address]

## 2023-11-25 NOTE — Transitions of Care (Post Inpatient/ED Visit) (Signed)
 Transition of Care week 3  Visit Note  11/25/2023  Name: Kathryn Beck MRN: [id number]          DOB: 1955/04/24  Situation: Patient enrolled in [facility] 30-day program. Visit completed with daughter by telephone.   Background:   Initial Transition Care Management Follow-up Telephone Call    Past Medical History:  Diagnosis Date   Acid reflux    Amputated left leg (HCC)    bilateral BKA   Anemia    Arthritis    Axillary masses    Soft tissue - status post excision   Back pain    CHF (congestive heart failure) (HCC)    COVID-19 virus infection 04/06/2019   Depression    End-stage renal disease (HCC)    M/W/F dialysis   Essential hypertension    Headache    years ago   History of blood transfusion    History of cardiac catheterization    Normal coronary arteries October 2020   History of claustrophobia    History of pneumonia 2019   Hypoxia 04/03/2019   Memory loss    Mixed hyperlipidemia    Obesity    Pancreatitis    Peritoneal dialysis catheter in place [facility])    Pneumonia due to COVID-19 virus 04/02/2019   Sleep apnea    Noncompliant with CPAP   Stroke (HCC)    mini stroke   Type 2 diabetes mellitus (HCC)     Assessment: Patient Reported Symptoms: Cognitive Cognitive Status: Other:, Unable to Assess (Reported Per daughter as informant on the call encounter patient is improving, participating with physical therapy, less confusion, more engaged, doing a lot better.)      Neurological Neurological Review of Symptoms: No symptoms reported (Reported Per daughter as informant on the call encounter patient is improving, participating with physical therapy, less confusion, more engaged, doing a lot better.) Neurological Comment: Reported Per daughter as informant on the call encounter patient is improving, participating with physical therapy, less confusion, more engaged, doing a lot better.  HEENT HEENT Symptoms Reported: No symptoms reported       Cardiovascular Cardiovascular Symptoms Reported: No symptoms reported (Reported Per daughter as informant on the call encounter patient is improving, participating with physical therapy, less confusion, more engaged, doing a lot better. Denies patient reports any chest pain or shortness of breath.) Does patient have uncontrolled Hypertension?: No Cardiovascular Management Strategies: Activity, Coping strategies, Medication therapy, Routine screening, Medical device Weight:  (Taken at HD MWF at PCP office if noted.) Cardiovascular Self-Management Outcome: 4 (good) Cardiovascular Comment: Daughter not with patient today so has not taken blood pressure today. Recorded at PCP HFU 11/24/23 as 145/64, HR 64, RR 16, SpO2 96%  Respiratory Respiratory Symptoms Reported: No symptoms reported Additional Respiratory Details: Reported Per daughter as informant on the call encounter patient is improving, participating with physical therapy, less confusion, more engaged, doing a lot better. Denies patient reports any chest pain or shortness of breath. Respiratory Management Strategies: Routine screening, Activity, Coping strategies, Medication therapy Respiratory Self-Management Outcome: 4 (good)  Endocrine Endocrine Symptoms Reported: No symptoms reported Is patient diabetic?: Yes Is patient checking blood sugars at home?: Yes List most recent blood sugar readings, include date and time of day: 107 recorded after HD on 11/24/23 per daughter as informant today, 11/25/23. Endocrine Self-Management Outcome: 4 (good) Endocrine Comment: Daughter says blood sugar has been pretty stable usually requiring only 2 units of additional SS coverage.  Gastrointestinal Gastrointestinal Symptoms Reported: No symptoms reported  Genitourinary Genitourinary Symptoms Reported: Other Other Genitourinary Symptoms: Patient on HD and produces no urine output at home per daughter. Genitourinary Management Strategies:  Hemodialysis Hemodialysis Schedule: MWF at [facility] Hemodialysis Last Treatment: 11/24/23 Genitourinary Self-Management Outcome: 4 (good) Genitourinary Comment: Transporation arranged via [facility] [place] to [place] [facility] in [place] when able and if family unable to take patient.  Integumentary Integumentary Symptoms Reported: Wound Additional Integumentary Details: HD fistula present last noted in LUE. Skin Management Strategies: Dressing changes, Activity, Medication therapy, Routine screening Skin Comment: [facility] home health assessments by RN, Ortho follow up 11/30/23 and ID follow up 11/25/23 at 330 pm today.  Musculoskeletal Musculoskelatal Symptoms Reviewed: Limited mobility, Unsteady gait, Weakness, Difficulty walking Additional Musculoskeletal Details: [facility] PT working with patient and patient is participating per daughter. Musculoskeletal Management Strategies: Activity, Coping strategies, Medication therapy, Routine screening, Medical device Musculoskeletal Comment: Wheel chair, family assist, Hoyer lift. Falls in the past year?: No Patient at Risk for Falls Due to: Impaired balance/gait, Impaired mobility, Medication side effect, Mental status change  Psychosocial Psychosocial Symptoms Reported: No symptoms reported Additional Psychological Details: Daughter as informant today states patient is doing better, more engaged, less confused, participating with PT via home health. Doing a lot better. Behavioral Management Strategies: Support system, Medication therapy, Activity, Coping strategies Behavioral Health Comment: Improving per daughter. Major Change/Loss/Stressor/Fears (CP): Medical condition, self Quality of Family Relationships: helpful, involved, supportive Do you feel physically threatened by others?: No (per daughter as informant today.)   There were no vitals filed for this visit.  Vitals recorded at 11/24/23 PCP HFU:  BP 145/65 Important   Pulse 64 Resp 16 Ht 5' 5 (1.651 m) SpO2 96% BMI 24.95 kg/m BSA 1.77 m  NOTE:  Blood sugar reported by daughter at 60 today. Weights done at HD on MWF:  Unable to access [facility] via Care Everywhere.   Medications Reviewed Today     Reviewed by Carolee Heron NOVAK, RN (Case Manager) on 11/25/23 at 1343  Med List Status: <None>   Medication Order Taking? Sig Documenting Provider Last Dose Status Informant  0.9 %  sodium chloride  infusion [id number]   Lockamy, Randi L, NP-C  Active   acetaminophen  (TYLENOL ) 500 MG tablet [id number] Yes Take 1,000 mg by mouth every 6 (six) hours as needed for mild pain (pain score 1-3). [provider]  Active Child, Pharmacy Records  amLODipine  (NORVASC ) 10 MG tablet [id number] Yes Take 0.5 tablets (5 mg total) by mouth daily. Pearlean Manus, MD  Active Child, Pharmacy Records           Med Note (WARD, CHUCK MATSU   Wed Nov 10, 2023  7:52 PM)    apixaban  (ELIQUIS ) 5 MG TABS tablet [id number] Yes Take 1 tablet (5 mg total) by mouth 2 (two) times daily. For Afib Pearlean Manus, MD  Active Child, Pharmacy Records  aspirin  81 MG chewable tablet [id number] Yes Chew 2 tablets (162 mg total) by mouth daily. Bevely Doffing, FNP  Active Child, Pharmacy Records  B Complex-C-Folic Acid  (RENA-VITE RX) 1 MG TABS [id number] Yes Take 1 tablet by mouth daily. [provider]  Active Child, Pharmacy Records           Med Note WORLEY, ALASKA S   Thu Nov 19, 2022 11:34 AM)    busPIRone  (BUSPAR ) 7.5 MG tablet [id number] Yes Take 1 tablet (7.5 mg total) by mouth 2 (two) times daily. Antonetta Rollene BRAVO, MD  Active   calcitRIOL  (ROCALTROL ) 0.5 MCG capsule  [id number] Yes Take 2 capsules (1 mcg total) by mouth every Monday, Wednesday, and Friday with hemodialysis. Caleen Burgess BROCKS, MD  Active Child, Pharmacy Records           Med Note SYDELL, HERON KATHEE Schaumann Nov 18, 2023 12:22 PM) Patient receives this with HD MWF.   cinacalcet  (SENSIPAR ) 30 MG tablet [id number] Yes  Take 3 tablets (90 mg total) by mouth 3 (three) times a week.  Patient taking differently: Take 90 mg by mouth every Monday, Wednesday, and Friday.   Sherrill Alejandro Donovan, DO  Active Child, Pharmacy Records           Med Note DRENA CHUCK MATSU   Sat May 01, 2023 12:25 PM)    Darbepoetin Alfa  (ARANESP ) 40 MCG/0.4ML SOSY injection [id number] Yes Inject 0.4 mLs (40 mcg total) into the skin every Friday at 6 PM.  Patient taking differently: Inject 40 mcg into the vein every Friday at 6 PM.   Caleen Burgess BROCKS, MD  Active Child, Pharmacy Records  ezetimibe  (ZETIA ) 10 MG tablet [id number] Yes Take 1 tablet (10 mg total) by mouth daily. Antonetta Rollene BRAVO, MD  Active Child, Pharmacy Records    Discontinued 05/28/11 1302 (Patient has not taken in last 30 days)     Discontinued 05/28/11 1302 (Change in therapy)   insulin  aspart (NOVOLOG ) 100 UNIT/ML injection [id number] Yes Inject 0-6 Units into the skin 3 (three) times daily with meals. CBG 70 - 120: 0 units  CBG 121 - 150: 0 units  CBG 151 - 200: 1 unit  CBG 201-250: 2 units  CBG 251-300: 3 units  CBG 301-350: 4 units  CBG 351-400: 5 units  CBG > 400: Give 10 units and call MD Shahmehdi, Seyed A, MD  Active   insulin  glargine, 2 Unit Dial , (TOUJEO  MAX SOLOSTAR) 300 UNIT/ML Solostar Pen [id number] Yes Inject 30 Units into the skin daily. May need to slowly increase the dose depending upon your blood sugar, follow-up with PCP Trixie File, MD  Active Child, Pharmacy Records           Med Note WORLEY, ALASKA S   Wed Oct 27, 2023  2:44 PM)    metoprolol  succinate (TOPROL -XL) 50 MG 24 hr tablet [id number] Yes TAKE (1) TABLET BY MOUTH DAILY WITH FOOD *TAKE AFTER DIALYSIS*  Patient taking differently: Take 50 mg by mouth every Tuesday, Thursday, Saturday, and Sunday. Non-dialysis days   Antonetta Rollene BRAVO, MD  Active Child, Pharmacy Records           Med Note Malcolm, CHUCK MATSU [place] Nov 10, 2023  3:02 PM)    sertraline  (ZOLOFT ) 50 MG tablet [id number]  Take  one and a half tablets once daily Antonetta Rollene BRAVO, MD  Active   torsemide  (DEMADEX ) 100 MG tablet [id number] Yes Take 1 tablet (100 mg total) by mouth every Tuesday, Thursday, Saturday, and Sunday.  Patient taking differently: Take 100 mg by mouth every Tuesday, Thursday, Saturday, and Sunday. Non-dialysis days   Pearlean Manus, MD  Active Child, Pharmacy Records           Med Note [place], CHUCK MATSU [place] Nov 10, 2023  7:52 PM)    [name]  500 MG chewable tablet [id number] Yes Chew 1-2 tablets (500-1,000 mg total) by mouth See admin instructions. Take 1000mg  (2 tablets) by mouth with meals and 500mg  (1 tablet) with snacks Antonetta Rollene BRAVO, MD  Active Child, Pharmacy Records  Med List Note SYDELL,  Heron NOVAK, RN 11/25/23 1342): Patient has dialysis on M-W-F, pt's daughter handles pt's medications 11/25/23 Medications list review with daughter, [name] on call today. Noted changes from PCP HFU visit.             Goals Addressed             This Visit's Progress    VBCI Transitions of Care (TOC) Care Plan   On track     Reviewed and or Updated with daughter on 11/25/23/25 TOC week 3 with daughter as informant, not with patient,  completed.   Problems:  Recent Hospitalization for treatment of Abscess of Lt hand Knowledge Deficit Related to Abscess of left hand and Medication management barrier  two medications patient was taking before admitted was omitted from list. Patient daughter will call PCP and see if she wants her to take them.  (Resolved as of this call).  PCP HFU completed on 11/24/23.  Ortho follow up for hand evaluation on 11/30/23 Infectious disease follow up 11/25/23 at 330 pm.   Goal:  Over the next 30 days, the patient will not experience hospital readmission  Interventions:  Transitions of Care:  Daughter states no unmet needs or new concerns for patient on this call.  Transportation to HD was arranged via [facility] to [facility] [place] per  daughter.  Reviewed medications/allergies/functional status/follow up appointments/review of systems/DME/Wound Care/home health.  Daughter reporting this call: denies patient complaints of chest pain or shortness of breath, patient mildly confused.  Daughter reports overall improvement and that she is doing a lot better, participating with PT in home, less confusion, and doing more.  PCP HFU completed on 11/24/23 with medications changes noted and reviewed with daughter.  Doctor Visits  - discussed the importance of doctor visits' Reviewed follow up visits coming up with specialists.  Contacted Health RN/OT/PT - [facility] has been to see patient and ordered wound care supplies. (Ongoing) Wound care reviewed.  Ortho and ID follow up pending.  PT coming to see patient and patient is participating with PT, per daughter.  Contacted DME company for patient use of equipment  Patient daughter has put in a call to see when the hospital bed will be delivered.  Resolved.    Patient Self Care Activities: (Daughter Managing/assisting) Attend all scheduled provider appointments Call pharmacy for medication refills 3-7 days in advance of running out of medications Call provider office for new concerns or questions  Notify RN Care Manager of TOC call rescheduling needs Participate in Transition of Care Program/Attend TOC scheduled calls Take medications as prescribed   LUE Continue to keep the drain in her hand and do daily basic dressing changes . Use Vashe if possible. (Per prior notes).  Continue to monitor blood pressure and blood sugars per daughter assistance.  Weight monitored via HD on MWF.    Plan:  Next PCP appointment scheduled for: [id number] Infectious disease Appointment [id number]  Telephone follow up appointment with care management team member scheduled for: 12/02/23 1 pm.  Continue with plan of care as discussed and reviewed.  The patient (Or contact of daughter as informant) has been  provided with contact information for the care management team and has been advised to call with any health-related questions or concerns.  The patient verbalized understanding with current POC.  The patient/family is directed to their insurance card regarding availability of benefits coverage     Bing Edison MSN, RN RN Case Manager [place]  [facility]-Population Health Office  Hours M-F [id number]-[id number] Direct Dial : [phone] Main Phone [phone]  Fax: [phone] [email or web address]            Plan:  Next PCP appointment scheduled for: [id number] Infectious disease Appointment [id number] Telephone follow up appointment with care management team member scheduled for: 12/02/23 1 pm.  Continue with plan of care as discussed and reviewed.  The patient (Or contact of daughter as informant) has been provided with contact information for the care management team and has been advised to call with any health-related questions or concerns.  The patient verbalized understanding with current POC.  The patient/family is directed to their insurance card regarding availability of benefits coverage.  Recommendation:   Continue Current Plan of Care  Follow Up Plan:   Telephone follow-up in 1 week 12/02/23 at 2 pm  Bing Edison MSN, RN RN Case Manager [place]  [facility]BC[facility]-Population Health Office Hours M-F [phone] Direct Dial : [phone] Main Phone [phone]  Fax: [phone] [email or web address]

## 2023-11-26 DIAGNOSIS — N186 End stage renal disease: Secondary | ICD-10-CM | POA: Diagnosis not present

## 2023-11-26 DIAGNOSIS — N2581 Secondary hyperparathyroidism of renal origin: Secondary | ICD-10-CM | POA: Diagnosis not present

## 2023-11-26 DIAGNOSIS — L03114 Cellulitis of left upper limb: Secondary | ICD-10-CM | POA: Diagnosis not present

## 2023-11-26 DIAGNOSIS — Z992 Dependence on renal dialysis: Secondary | ICD-10-CM | POA: Diagnosis not present

## 2023-11-29 DIAGNOSIS — N186 End stage renal disease: Secondary | ICD-10-CM | POA: Diagnosis not present

## 2023-11-29 DIAGNOSIS — L03114 Cellulitis of left upper limb: Secondary | ICD-10-CM | POA: Diagnosis not present

## 2023-11-29 DIAGNOSIS — Z992 Dependence on renal dialysis: Secondary | ICD-10-CM | POA: Diagnosis not present

## 2023-11-29 DIAGNOSIS — N2581 Secondary hyperparathyroidism of renal origin: Secondary | ICD-10-CM | POA: Diagnosis not present

## 2023-11-30 ENCOUNTER — Other Ambulatory Visit: Payer: Self-pay | Admitting: Orthopedic Surgery

## 2023-11-30 ENCOUNTER — Ambulatory Visit (INDEPENDENT_AMBULATORY_CARE_PROVIDER_SITE_OTHER): Admitting: Orthopedic Surgery

## 2023-11-30 DIAGNOSIS — E782 Mixed hyperlipidemia: Secondary | ICD-10-CM | POA: Diagnosis not present

## 2023-11-30 DIAGNOSIS — J9811 Atelectasis: Secondary | ICD-10-CM | POA: Diagnosis not present

## 2023-11-30 DIAGNOSIS — T8742 Infection of amputation stump, left upper extremity: Secondary | ICD-10-CM | POA: Diagnosis not present

## 2023-11-30 DIAGNOSIS — L02512 Cutaneous abscess of left hand: Secondary | ICD-10-CM | POA: Diagnosis not present

## 2023-11-30 DIAGNOSIS — E1151 Type 2 diabetes mellitus with diabetic peripheral angiopathy without gangrene: Secondary | ICD-10-CM | POA: Diagnosis not present

## 2023-11-30 DIAGNOSIS — Z89511 Acquired absence of right leg below knee: Secondary | ICD-10-CM | POA: Diagnosis not present

## 2023-11-30 DIAGNOSIS — I70203 Unspecified atherosclerosis of native arteries of extremities, bilateral legs: Secondary | ICD-10-CM | POA: Diagnosis not present

## 2023-11-30 DIAGNOSIS — I350 Nonrheumatic aortic (valve) stenosis: Secondary | ICD-10-CM | POA: Diagnosis not present

## 2023-11-30 DIAGNOSIS — I132 Hypertensive heart and chronic kidney disease with heart failure and with stage 5 chronic kidney disease, or end stage renal disease: Secondary | ICD-10-CM | POA: Diagnosis not present

## 2023-11-30 DIAGNOSIS — G4733 Obstructive sleep apnea (adult) (pediatric): Secondary | ICD-10-CM | POA: Diagnosis not present

## 2023-11-30 DIAGNOSIS — I96 Gangrene, not elsewhere classified: Secondary | ICD-10-CM

## 2023-11-30 DIAGNOSIS — L03114 Cellulitis of left upper limb: Secondary | ICD-10-CM | POA: Diagnosis not present

## 2023-11-30 DIAGNOSIS — K219 Gastro-esophageal reflux disease without esophagitis: Secondary | ICD-10-CM | POA: Diagnosis not present

## 2023-11-30 DIAGNOSIS — I5032 Chronic diastolic (congestive) heart failure: Secondary | ICD-10-CM | POA: Diagnosis not present

## 2023-11-30 DIAGNOSIS — I480 Paroxysmal atrial fibrillation: Secondary | ICD-10-CM | POA: Diagnosis not present

## 2023-11-30 DIAGNOSIS — N2581 Secondary hyperparathyroidism of renal origin: Secondary | ICD-10-CM | POA: Diagnosis not present

## 2023-11-30 DIAGNOSIS — D631 Anemia in chronic kidney disease: Secondary | ICD-10-CM | POA: Diagnosis not present

## 2023-11-30 DIAGNOSIS — B9561 Methicillin susceptible Staphylococcus aureus infection as the cause of diseases classified elsewhere: Secondary | ICD-10-CM | POA: Diagnosis not present

## 2023-11-30 DIAGNOSIS — E1122 Type 2 diabetes mellitus with diabetic chronic kidney disease: Secondary | ICD-10-CM | POA: Diagnosis not present

## 2023-11-30 DIAGNOSIS — N186 End stage renal disease: Secondary | ICD-10-CM | POA: Diagnosis not present

## 2023-11-30 DIAGNOSIS — Z8673 Personal history of transient ischemic attack (TIA), and cerebral infarction without residual deficits: Secondary | ICD-10-CM | POA: Diagnosis not present

## 2023-11-30 NOTE — Progress Notes (Signed)
   Kathryn Beck - 68 y.o. female MRN [id number]  Date of birth: 03/05/1956  Office Visit Note: Visit Date: 11/30/2023 PCP: Antonetta Rollene BRAVO, MD Referred by: Antonetta Rollene BRAVO, MD  Subjective:  HPI: Kathryn Beck is a 68 y.o. female who presents today for follow up 4 weeks status post left hand and wrist irrigation and debridement for ongoing infection in the setting of prior ray resection for gangrenous digit.  Has been doing dressing changes once to twice a day as instructed.  No significant active drainage, no significant systemic symptoms.  Is getting IV antibiotics regularly with dialysis.  Pertinent ROS were reviewed with the patient and found to be negative unless otherwise specified above in HPI.   Assessment & Plan: Visit Diagnoses:  1. 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  There is unclear if this is being temporized by the ongoing and IV antibiotics, there is very slow healing on examination today.  There remains an appearance of dry gangrenous tissue throughout the left hand.  I explained that we can continue to take a watch and wait approach to allow for ongoing healing, would recommend continuing with IV antibiotics for the time being.  Follow-up in approximate 1 month for repeat wound check.  Continue with dressing changes.  Will allow sutures to remain in for the time being.  There is still does exist the possibility of forearm amputation should she continue to demonstrate ongoing gangrenous hand and wrist.  This will be done for source control to prevent systemic symptoms.  I reviewed infectious disease note from earlier this week, plan for 4 weeks of IV antibiotics to continue.  Will plan to follow-up then.  Strict return precautions were discussed with patient and her daughter today.  Follow-up: No follow-ups on file.   Meds & Orders: No orders of the defined types  were placed in this encounter.  No orders of the defined types were placed in this encounter.    Procedures: No procedures performed       Objective:   Vital Signs: There were no vitals taken for this visit.  Ortho Exam Left upper extremity: -  longitudinal incisions to the dorsal aspect of the hand and wrist with sutures in place demonstrating slow healing, mild drainage is expressible in these regions, no significant erythema, dusky appearance to the dorsal aspect of the hand, minimal color is appreciated, poor sensation to the distal aspects of the digits, range of motion is limited secondary to pain at the wrist and hand level  Imaging: No results found.   Sujey Gundry Afton Alderton, M.D. [facility], Hand Surgery

## 2023-12-01 ENCOUNTER — Inpatient Hospital Stay (HOSPITAL_COMMUNITY)
Admission: EM | Admit: 2023-12-01 | Discharge: 2023-12-07 | DRG: 377 | Disposition: A | Discharge status: Home Health | Attending: Family Medicine | Admitting: Family Medicine

## 2023-12-01 ENCOUNTER — Other Ambulatory Visit: Payer: Self-pay

## 2023-12-01 DIAGNOSIS — Z794 Long term (current) use of insulin: Secondary | ICD-10-CM | POA: Diagnosis not present

## 2023-12-01 DIAGNOSIS — E1165 Type 2 diabetes mellitus with hyperglycemia: Secondary | ICD-10-CM | POA: Diagnosis present

## 2023-12-01 DIAGNOSIS — Z7901 Long term (current) use of anticoagulants: Secondary | ICD-10-CM

## 2023-12-01 DIAGNOSIS — G4733 Obstructive sleep apnea (adult) (pediatric): Secondary | ICD-10-CM | POA: Diagnosis not present

## 2023-12-01 DIAGNOSIS — N186 End stage renal disease: Secondary | ICD-10-CM | POA: Diagnosis not present

## 2023-12-01 DIAGNOSIS — I96 Gangrene, not elsewhere classified: Secondary | ICD-10-CM | POA: Diagnosis not present

## 2023-12-01 DIAGNOSIS — E1152 Type 2 diabetes mellitus with diabetic peripheral angiopathy with gangrene: Secondary | ICD-10-CM | POA: Diagnosis not present

## 2023-12-01 DIAGNOSIS — Z8673 Personal history of transient ischemic attack (TIA), and cerebral infarction without residual deficits: Secondary | ICD-10-CM

## 2023-12-01 DIAGNOSIS — K219 Gastro-esophageal reflux disease without esophagitis: Secondary | ICD-10-CM | POA: Diagnosis present

## 2023-12-01 DIAGNOSIS — I259 Chronic ischemic heart disease, unspecified: Secondary | ICD-10-CM | POA: Diagnosis not present

## 2023-12-01 DIAGNOSIS — K635 Polyp of colon: Secondary | ICD-10-CM | POA: Diagnosis not present

## 2023-12-01 DIAGNOSIS — Z992 Dependence on renal dialysis: Secondary | ICD-10-CM | POA: Diagnosis not present

## 2023-12-01 DIAGNOSIS — Z7984 Long term (current) use of oral hypoglycemic drugs: Secondary | ICD-10-CM

## 2023-12-01 DIAGNOSIS — K5731 Diverticulosis of large intestine without perforation or abscess with bleeding: Principal | ICD-10-CM | POA: Diagnosis present

## 2023-12-01 DIAGNOSIS — D123 Benign neoplasm of transverse colon: Secondary | ICD-10-CM | POA: Diagnosis not present

## 2023-12-01 DIAGNOSIS — E782 Mixed hyperlipidemia: Secondary | ICD-10-CM | POA: Diagnosis not present

## 2023-12-01 DIAGNOSIS — Z860101 Personal history of adenomatous and serrated colon polyps: Secondary | ICD-10-CM

## 2023-12-01 DIAGNOSIS — I509 Heart failure, unspecified: Secondary | ICD-10-CM | POA: Diagnosis not present

## 2023-12-01 DIAGNOSIS — F411 Generalized anxiety disorder: Secondary | ICD-10-CM | POA: Diagnosis present

## 2023-12-01 DIAGNOSIS — Z8619 Personal history of other infectious and parasitic diseases: Secondary | ICD-10-CM

## 2023-12-01 DIAGNOSIS — D649 Anemia, unspecified: Secondary | ICD-10-CM | POA: Diagnosis not present

## 2023-12-01 DIAGNOSIS — Z79899 Other long term (current) drug therapy: Secondary | ICD-10-CM

## 2023-12-01 DIAGNOSIS — L03114 Cellulitis of left upper limb: Secondary | ICD-10-CM | POA: Diagnosis not present

## 2023-12-01 DIAGNOSIS — Z8616 Personal history of COVID-19: Secondary | ICD-10-CM

## 2023-12-01 DIAGNOSIS — D72829 Elevated white blood cell count, unspecified: Secondary | ICD-10-CM | POA: Diagnosis not present

## 2023-12-01 DIAGNOSIS — R918 Other nonspecific abnormal finding of lung field: Secondary | ICD-10-CM | POA: Diagnosis not present

## 2023-12-01 DIAGNOSIS — Z880 Allergy status to penicillin: Secondary | ICD-10-CM

## 2023-12-01 DIAGNOSIS — R195 Other fecal abnormalities: Secondary | ICD-10-CM | POA: Diagnosis not present

## 2023-12-01 DIAGNOSIS — F32A Depression, unspecified: Secondary | ICD-10-CM | POA: Diagnosis present

## 2023-12-01 DIAGNOSIS — I4891 Unspecified atrial fibrillation: Secondary | ICD-10-CM | POA: Diagnosis not present

## 2023-12-01 DIAGNOSIS — R1310 Dysphagia, unspecified: Secondary | ICD-10-CM | POA: Diagnosis present

## 2023-12-01 DIAGNOSIS — D122 Benign neoplasm of ascending colon: Secondary | ICD-10-CM | POA: Diagnosis not present

## 2023-12-01 DIAGNOSIS — N250 Renal osteodystrophy: Secondary | ICD-10-CM | POA: Diagnosis not present

## 2023-12-01 DIAGNOSIS — M869 Osteomyelitis, unspecified: Secondary | ICD-10-CM | POA: Diagnosis not present

## 2023-12-01 DIAGNOSIS — L89319 Pressure ulcer of right buttock, unspecified stage: Secondary | ICD-10-CM | POA: Diagnosis present

## 2023-12-01 DIAGNOSIS — J984 Other disorders of lung: Secondary | ICD-10-CM | POA: Diagnosis not present

## 2023-12-01 DIAGNOSIS — M868X9 Other osteomyelitis, unspecified sites: Secondary | ICD-10-CM | POA: Diagnosis not present

## 2023-12-01 DIAGNOSIS — Z89612 Acquired absence of left leg above knee: Secondary | ICD-10-CM

## 2023-12-01 DIAGNOSIS — Z89022 Acquired absence of left finger(s): Secondary | ICD-10-CM

## 2023-12-01 DIAGNOSIS — Z8249 Family history of ischemic heart disease and other diseases of the circulatory system: Secondary | ICD-10-CM

## 2023-12-01 DIAGNOSIS — K922 Gastrointestinal hemorrhage, unspecified: Secondary | ICD-10-CM | POA: Diagnosis not present

## 2023-12-01 DIAGNOSIS — D631 Anemia in chronic kidney disease: Secondary | ICD-10-CM | POA: Diagnosis not present

## 2023-12-01 DIAGNOSIS — Z8261 Family history of arthritis: Secondary | ICD-10-CM

## 2023-12-01 DIAGNOSIS — E1169 Type 2 diabetes mellitus with other specified complication: Secondary | ICD-10-CM | POA: Diagnosis not present

## 2023-12-01 DIAGNOSIS — D509 Iron deficiency anemia, unspecified: Secondary | ICD-10-CM | POA: Diagnosis not present

## 2023-12-01 DIAGNOSIS — K5900 Constipation, unspecified: Secondary | ICD-10-CM | POA: Diagnosis present

## 2023-12-01 DIAGNOSIS — Z723 Lack of physical exercise: Secondary | ICD-10-CM

## 2023-12-01 DIAGNOSIS — E11649 Type 2 diabetes mellitus with hypoglycemia without coma: Secondary | ICD-10-CM | POA: Diagnosis not present

## 2023-12-01 DIAGNOSIS — T182XXA Foreign body in stomach, initial encounter: Secondary | ICD-10-CM | POA: Diagnosis not present

## 2023-12-01 DIAGNOSIS — I132 Hypertensive heart and chronic kidney disease with heart failure and with stage 5 chronic kidney disease, or end stage renal disease: Secondary | ICD-10-CM | POA: Diagnosis present

## 2023-12-01 DIAGNOSIS — Z89611 Acquired absence of right leg above knee: Secondary | ICD-10-CM | POA: Diagnosis not present

## 2023-12-01 DIAGNOSIS — Z90710 Acquired absence of both cervix and uterus: Secondary | ICD-10-CM

## 2023-12-01 DIAGNOSIS — Z888 Allergy status to other drugs, medicaments and biological substances status: Secondary | ICD-10-CM

## 2023-12-01 DIAGNOSIS — E1122 Type 2 diabetes mellitus with diabetic chronic kidney disease: Secondary | ICD-10-CM | POA: Diagnosis not present

## 2023-12-01 DIAGNOSIS — D500 Iron deficiency anemia secondary to blood loss (chronic): Secondary | ICD-10-CM | POA: Diagnosis not present

## 2023-12-01 DIAGNOSIS — Z83438 Family history of other disorder of lipoprotein metabolism and other lipidemia: Secondary | ICD-10-CM

## 2023-12-01 DIAGNOSIS — I480 Paroxysmal atrial fibrillation: Secondary | ICD-10-CM | POA: Diagnosis present

## 2023-12-01 DIAGNOSIS — Z89511 Acquired absence of right leg below knee: Secondary | ICD-10-CM | POA: Diagnosis not present

## 2023-12-01 DIAGNOSIS — D62 Acute posthemorrhagic anemia: Secondary | ICD-10-CM | POA: Diagnosis present

## 2023-12-01 DIAGNOSIS — I5032 Chronic diastolic (congestive) heart failure: Secondary | ICD-10-CM | POA: Diagnosis not present

## 2023-12-01 DIAGNOSIS — S88912A Complete traumatic amputation of left lower leg, level unspecified, initial encounter: Secondary | ICD-10-CM | POA: Diagnosis not present

## 2023-12-01 DIAGNOSIS — I10 Essential (primary) hypertension: Secondary | ICD-10-CM | POA: Diagnosis not present

## 2023-12-01 DIAGNOSIS — Z7982 Long term (current) use of aspirin: Secondary | ICD-10-CM

## 2023-12-01 DIAGNOSIS — N2581 Secondary hyperparathyroidism of renal origin: Secondary | ICD-10-CM | POA: Diagnosis not present

## 2023-12-01 LAB — CBC
HCT: 22.8 % — ABNORMAL LOW (ref 36.0–46.0)
Hemoglobin: 6.8 g/dL — CL (ref 12.0–15.0)
MCH: 24.7 pg — ABNORMAL LOW (ref 26.0–34.0)
MCHC: 29.8 g/dL — ABNORMAL LOW (ref 30.0–36.0)
MCV: 82.9 fL (ref 80.0–100.0)
Platelets: 288 K/uL (ref 150–400)
RBC: 2.75 MIL/uL — ABNORMAL LOW (ref 3.87–5.11)
RDW: 19.5 % — ABNORMAL HIGH (ref 11.5–15.5)
WBC: 7.1 K/uL (ref 4.0–10.5)
nRBC: 0 % (ref 0.0–0.2)

## 2023-12-01 LAB — COMPREHENSIVE METABOLIC PANEL WITH GFR
ALT: 8 U/L (ref 0–44)
AST: 18 U/L (ref 15–41)
Albumin: 3 g/dL — ABNORMAL LOW (ref 3.5–5.0)
Alkaline Phosphatase: 81 U/L (ref 38–126)
Anion gap: 14 (ref 5–15)
BUN: 15 mg/dL (ref 8–23)
CO2: 28 mmol/L (ref 22–32)
Calcium: 8.6 mg/dL — ABNORMAL LOW (ref 8.9–10.3)
Chloride: 99 mmol/L (ref 98–111)
Creatinine, Ser: 2.15 mg/dL — ABNORMAL HIGH (ref 0.44–1.00)
GFR, Estimated: 25 mL/min — ABNORMAL LOW (ref >60)
Glucose, Bld: 209 mg/dL — ABNORMAL HIGH (ref 70–99)
Potassium: 3.9 mmol/L (ref 3.5–5.1)
Sodium: 141 mmol/L (ref 135–145)
Total Bilirubin: 0.4 mg/dL (ref 0.0–1.2)
Total Protein: 6.4 g/dL — ABNORMAL LOW (ref 6.5–8.1)

## 2023-12-01 LAB — GLUCOSE, CAPILLARY: Glucose-Capillary: 181 mg/dL — ABNORMAL HIGH (ref 70–99)

## 2023-12-01 LAB — POC OCCULT BLOOD, ED: Fecal Occult Bld: POSITIVE — AB

## 2023-12-01 LAB — PREPARE RBC (CROSSMATCH)

## 2023-12-01 MED ORDER — TORSEMIDE 20 MG PO TABS
100.0000 mg | ORAL_TABLET | ORAL | Status: DC
  Administered 2023-12-02 – 2023-12-07 (×4): 100 mg via ORAL
  Filled 2023-12-01 (×5): qty 5

## 2023-12-01 MED ORDER — INSULIN GLARGINE-YFGN 100 UNIT/ML ~~LOC~~ SOLN
15.0000 [IU] | Freq: Every day | SUBCUTANEOUS | Status: DC
Start: 2023-12-01 — End: 2023-12-01

## 2023-12-01 MED ORDER — INSULIN ASPART 100 UNIT/ML IJ SOLN
0.0000 [IU] | Freq: Three times a day (TID) | INTRAMUSCULAR | Status: DC
  Administered 2023-12-06 (×2): 1 [IU] via SUBCUTANEOUS
  Administered 2023-12-07: 2 [IU] via SUBCUTANEOUS

## 2023-12-01 MED ORDER — CEFAZOLIN SODIUM-DEXTROSE 2-4 GM/100ML-% IV SOLN
2.0000 g | INTRAVENOUS | Status: DC
  Administered 2023-12-06: 2 g via INTRAVENOUS
  Filled 2023-12-01: qty 100

## 2023-12-01 MED ORDER — SUCROFERRIC OXYHYDROXIDE 500 MG PO CHEW: 500.0000 mg | CHEWABLE_TABLET | ORAL | Status: DC

## 2023-12-01 MED ORDER — RENA-VITE RX 1 MG PO TABS: 1.0000 | ORAL_TABLET | Freq: Every day | ORAL | Status: DC

## 2023-12-01 MED ORDER — HYDRALAZINE HCL 20 MG/ML IJ SOLN: 5.0000 mg | INTRAMUSCULAR | Status: DC | PRN

## 2023-12-01 MED ORDER — EZETIMIBE 10 MG PO TABS
10.0000 mg | ORAL_TABLET | Freq: Every day | ORAL | Status: DC
  Administered 2023-12-02 – 2023-12-07 (×6): 10 mg via ORAL
  Filled 2023-12-01 (×6): qty 1

## 2023-12-01 MED ORDER — SUCROFERRIC OXYHYDROXIDE 500 MG PO CHEW
1000.0000 mg | CHEWABLE_TABLET | Freq: Three times a day (TID) | ORAL | Status: DC
  Administered 2023-12-02 – 2023-12-07 (×13): 1000 mg via ORAL
  Filled 2023-12-01 (×20): qty 2

## 2023-12-01 MED ORDER — RENA-VITE PO TABS
1.0000 | ORAL_TABLET | Freq: Every day | ORAL | Status: DC
  Administered 2023-12-01 – 2023-12-06 (×6): 1 via ORAL
  Filled 2023-12-01 (×6): qty 1

## 2023-12-01 MED ORDER — METOPROLOL SUCCINATE ER 50 MG PO TB24
50.0000 mg | ORAL_TABLET | ORAL | Status: DC
  Administered 2023-12-02 – 2023-12-07 (×5): 50 mg via ORAL
  Filled 2023-12-01 (×5): qty 1

## 2023-12-01 MED ORDER — PANTOPRAZOLE SODIUM 40 MG IV SOLR
40.0000 mg | Freq: Once | INTRAVENOUS | Status: AC
  Administered 2023-12-01: 40 mg via INTRAVENOUS
  Filled 2023-12-01: qty 10

## 2023-12-01 MED ORDER — PANTOPRAZOLE SODIUM 40 MG IV SOLR
40.0000 mg | Freq: Two times a day (BID) | INTRAVENOUS | Status: DC
  Administered 2023-12-01 – 2023-12-05 (×9): 40 mg via INTRAVENOUS
  Filled 2023-12-01 (×10): qty 10

## 2023-12-01 MED ORDER — INSULIN GLARGINE 100 UNIT/ML ~~LOC~~ SOLN
15.0000 [IU] | Freq: Every day | SUBCUTANEOUS | Status: DC
  Administered 2023-12-01 – 2023-12-02 (×2): 15 [IU] via SUBCUTANEOUS
  Filled 2023-12-01 (×3): qty 0.15

## 2023-12-01 MED ORDER — ACETAMINOPHEN 650 MG RE SUPP: 650.0000 mg | Freq: Four times a day (QID) | RECTAL | Status: DC | PRN

## 2023-12-01 MED ORDER — SUCROFERRIC OXYHYDROXIDE 500 MG PO CHEW: 500.0000 mg | CHEWABLE_TABLET | ORAL | Status: DC | PRN

## 2023-12-01 MED ORDER — ACETAMINOPHEN 325 MG PO TABS
650.0000 mg | ORAL_TABLET | Freq: Four times a day (QID) | ORAL | Status: DC | PRN
  Administered 2023-12-02 – 2023-12-03 (×2): 650 mg via ORAL
  Filled 2023-12-01 (×2): qty 2

## 2023-12-01 MED ORDER — SODIUM CHLORIDE 0.9% IV SOLUTION: Freq: Once | INTRAVENOUS | Status: AC

## 2023-12-01 NOTE — H&P (Signed)
 [facility] H&P   Patient Demographics:    Kathryn Beck, is a 68 y.o. female  MRN: [id number]   DOB - [birth year]  Admit Date - 12/01/2023  Outpatient Primary MD for the patient is Antonetta Rollene BRAVO, MD  Referring MD/NP/PA: PA Celeste  Outpatient Specialists: Hand surgery Dr. Arlinda, ID Dr. Montie Cleverly  Patient coming from: HD center  Chief Complaint  Patient presents with   low hemoglobin      HPI:    Kathryn Beck  is a 68 y.o. female, with significant history of Atrial fib on apixaban , PAD  (s/p R BKA in 05/2022, prior lithotripsy and stenting of the left superficial femoral and popliteal artery in 06/2022 and ultimately L BKA in 09/2022), HTN, HLD, Type 2 DM, bilateral adrenal adenoma (followed by Endocrinology), chronic HFpEF, aortic stenosis, OSA, ESRD (MWF), history of TIA's, history of recurrent chest pain  (cath in 12/2018 showing normal coronary arteries) recent hospitalization for left hand I&D, MSSA infection on antibiotics IV Ancef  with HD - She was sent by nephrologist after HD for anemia and low hemoglobin, labs done at the HD center noted for hemoglobin of 6.3, most recent baseline was 8.4 on 8/25, he is on apixaban , sent to ED for further evaluation. -In ED hemoglobin noted to be 6.8, she was Hemoccult positive, but stools were normal in color in rectal exam the ED physician, she was ordered 1 unit PRBC, ED discussed with GI who recommended clear liquid diet, holding Eliquis , starting on Protonix  and to admit to hospitalist service.        Review of systems:     A full 10 point Review of Systems was done, except as stated above, all other Review of Systems were negative.   With Past History of the following :    Past Medical History:  Diagnosis Date   Acid reflux    Amputated left leg (HCC)    bilateral BKA   Anemia    Arthritis    Axillary masses     Soft tissue - status post excision   Back pain    CHF (congestive heart failure) (HCC)    COVID-19 virus infection 04/06/2019   Depression    End-stage renal disease (HCC)    M/W/F dialysis   Essential hypertension    Headache    years ago   History of blood transfusion    History of cardiac catheterization    Normal coronary arteries October 2020   History of claustrophobia    History of pneumonia 2019   Hypoxia 04/03/2019   Memory loss    Mixed hyperlipidemia    Obesity    Pancreatitis    Peritoneal dialysis catheter in place [facility])    Pneumonia due to COVID-19 virus 04/02/2019   Sleep apnea    Noncompliant with CPAP   Stroke (HCC)    mini stroke   Type 2  diabetes mellitus [facility])       Past Surgical History:  Procedure Laterality Date   ABDOMINAL AORTOGRAM W/LOWER EXTREMITY N/A 04/30/2022   Procedure: ABDOMINAL AORTOGRAM W/LOWER EXTREMITY;  Surgeon: Magda Debby SAILOR, MD;  Location: [facility] INVASIVE CV LAB;  Service: Cardiovascular;  Laterality: N/A;   ABDOMINAL AORTOGRAM W/LOWER EXTREMITY N/A 07/21/2022   Procedure: ABDOMINAL AORTOGRAM W/LOWER EXTREMITY;  Surgeon: Serene Gaile ORN, MD;  Location: [facility];  Service: Cardiovascular;  Laterality: N/A;   ABDOMINAL HYSTERECTOMY     ACHILLES TENDON LENGTHENING  08/15/2022   Procedure: ACHILLES TENDON LENGTHENING;  Surgeon: Silva Juliene SAUNDERS, DPM;  Location: [facility] OR;  Service: Podiatry;;   AMPUTATION Right 05/29/2022   Procedure: RIGHT BELOW THE KNEE AMPUTATION;  Surgeon: Harden Jerona GAILS, MD;  Location: [facility] OR;  Service: Orthopedics;  Laterality: Right;   AMPUTATION Left 09/04/2022   Procedure: AMPUTATION FOOT, serial irrigation;  Surgeon: Joya Stabs, DPM;  Location: [facility] OR;  Service: Podiatry;  Laterality: Left;  Surgical team to do block   AMPUTATION Left 10/07/2022   Procedure: LEFT BELOW KNEE AMPUTATION;  Surgeon: Harden Jerona GAILS, MD;  Location: [facility] OR;  Service: Orthopedics;  Laterality: Left;   AMPUTATION FINGER Left 09/29/2023    Procedure: AMPUTATION, FINGER;  Surgeon: Harden Jerona GAILS, MD;  Location: [facility] OR;  Service: Orthopedics;  Laterality: Left;  LEFT HAND LONG FINGER RAY AMPUTATION   AV FISTULA PLACEMENT Left 09/02/2017   Procedure: creation of left arm ARTERIOVENOUS (AV) FISTULA;  Surgeon: Serene Gaile ORN, MD;  Location: [facility] OR;  Service: Vascular;  Laterality: Left;   COLONOSCOPY  2008   Dr. Harvey: normal    COLONOSCOPY N/A 12/18/2016   Dr. Harvey: multiple tubular adenomas, internal hemorrhoids. Surveillance in 3 years    ESOPHAGEAL DILATION N/A 10/13/2015   Procedure: ESOPHAGEAL DILATION;  Surgeon: Claudis RAYMOND Rivet, MD;  Location: [facility];  Service: Endoscopy;  Laterality: N/A;   ESOPHAGOGASTRODUODENOSCOPY N/A 10/13/2015   Dr. Rivet: chronic gastritis on path, no H.pylori. Empiric dilation    ESOPHAGOGASTRODUODENOSCOPY N/A 12/18/2016   Dr. Harvey: mild gastritis. BRAVO study revealed uncontrolled GERD. Dysphagia secondary to uncontrolled reflux   FOOT SURGERY Bilateral    nerve     INCISION AND DRAINAGE OF WOUND Left 10/30/2023   Procedure: IRRIGATION AND DEBRIDEMENT WOUND;  Surgeon: Arlinda Buster, MD;  Location: [facility] OR;  Service: Orthopedics;  Laterality: Left;  LEFT HAND WOUND   LEFT HEART CATH AND CORONARY ANGIOGRAPHY N/A 12/29/2018   Procedure: LEFT HEART CATH AND CORONARY ANGIOGRAPHY;  Surgeon: Dann Candyce RAMAN, MD;  Location: [facility] INVASIVE CV LAB;  Service: Cardiovascular;  Laterality: N/A;   LOWER EXTREMITY ANGIOGRAPHY Right 05/04/2022   Procedure: Lower Extremity Angiography;  Surgeon: Lanis Fonda BRAVO, MD;  Location: [facility] INVASIVE CV LAB;  Service: Cardiovascular;  Laterality: Right;   LUNG BIOPSY     MASS EXCISION Right 01/09/2013   Procedure: EXCISION OF NEOPLASM OF RIGHT  AXILLA  AND EXCISION OF NEOPLASM OF LEFT AXILLA;  Surgeon: Oneil DELENA Budge, MD;  Location: [facility] ORS;  Service: General;  Laterality: Right;  procedure end @ 08:23   MYRINGOTOMY WITH TUBE PLACEMENT Bilateral 04/28/2017   Procedure:  BILATERAL MYRINGOTOMY WITH TUBE PLACEMENT;  Surgeon: Karis Clunes, MD;  Location: [facility] OR;  Service: ENT;  Laterality: Bilateral;   PERIPHERAL VASCULAR BALLOON ANGIOPLASTY Right 05/04/2022   Procedure: PERIPHERAL VASCULAR BALLOON ANGIOPLASTY;  Surgeon: Lanis Fonda BRAVO, MD;  Location: [facility] INVASIVE CV LAB;  Service: Cardiovascular;  Laterality: Right;  PT   PERIPHERAL VASCULAR INTERVENTION Right 05/04/2022   Procedure: PERIPHERAL VASCULAR INTERVENTION;  Surgeon: Lanis Fonda BRAVO, MD;  Location: [facility] INVASIVE CV LAB;  Service: Cardiovascular;  Laterality: Right;  SFA   PERIPHERAL VASCULAR INTERVENTION Left 07/21/2022   Procedure: PERIPHERAL VASCULAR INTERVENTION;  Surgeon: Serene Gaile ORN, MD;  [facility];  Service: Cardiovascular;  Laterality: Left;   REVISION OF ARTERIOVENOUS GORETEX GRAFT Left 05/04/2018   Procedure: TRANSPOSITION OF CEPHALIC VEIN ARTERIOVENOUS FISTULA LEFT ARM;  Surgeon: Oris Krystal FALCON, MD;  Location: [facility] OR;  Service: Vascular;  Laterality: Left;   SAVORY DILATION N/A 12/18/2016   Procedure: SAVORY DILATION;  Surgeon: Harvey Margo CROME, MD;  [facility];  Service: Endoscopy;  Laterality: N/A;   TRANSMETATARSAL AMPUTATION Left 08/15/2022   Procedure: TRANSMETATARSAL AMPUTATION;  Surgeon: Silva Juliene SAUNDERS, DPM;  Location: [facility] OR;  Service: Podiatry;  Laterality: Left;      Social History:     Social History   Tobacco Use   Smoking status: Never    Passive exposure: Never   Smokeless tobacco: Never   Tobacco comments:    Verified by Daughter, [name]  Substance Use Topics   Alcohol use: No       Family History :     Family History  Problem Relation Age of Onset   Hypertension Father    Hypercholesterolemia Father    Arthritis Father    Hypertension Sister    Hypercholesterolemia Sister    Breast cancer Sister    Hypertension Sister    Colon cancer Neg Hx    Colon polyps Neg Hx      Home Medications:   Prior to Admission medications    Medication Sig Start Date End Date Taking? Authorizing Provider  acetaminophen  (TYLENOL ) 500 MG tablet Take 1,000 mg by mouth every 6 (six) hours as needed for mild pain (pain score 1-3).    [provider]  amLODipine  (NORVASC ) 10 MG tablet Take 0.5 tablets (5 mg total) by mouth daily. 10/28/23   Pearlean Manus, MD  apixaban  (ELIQUIS ) 5 MG TABS tablet Take 1 tablet (5 mg total) by mouth 2 (two) times daily. For Afib 10/29/23   Pearlean Manus, MD  aspirin  81 MG chewable tablet Chew 2 tablets (162 mg total) by mouth daily. 09/16/23   Bevely Doffing, FNP  B Complex-C-Folic Acid  (RENA-VITE RX) 1 MG TABS Take 1 tablet by mouth daily. 04/15/22   [provider]  busPIRone  (BUSPAR ) 7.5 MG tablet Take 1 tablet (7.5 mg total) by mouth 2 (two) times daily. 11/24/23   Antonetta Rollene BRAVO, MD  calcitRIOL  (ROCALTROL ) 0.5 MCG capsule Take 2 capsules (1 mcg total) by mouth every Monday, Wednesday, and Friday with hemodialysis. 11/05/23   Amin, Ankit C, MD  cinacalcet  (SENSIPAR ) 30 MG tablet Take 3 tablets (90 mg total) by mouth 3 (three) times a week. Patient taking differently: Take 90 mg by mouth every Monday, Wednesday, and Friday. 09/09/22   Sherrill Cable Latif, DO  Darbepoetin Alfa  (ARANESP ) 40 MCG/0.4ML SOSY injection Inject 0.4 mLs (40 mcg total) into the skin every Friday at 6 PM. Patient taking differently: Inject 40 mcg into the vein every Friday at 6 PM. 11/05/23   Amin, Ankit C, MD  ezetimibe  (ZETIA ) 10 MG tablet Take 1 tablet (10 mg total) by mouth daily. 04/27/23   Antonetta Rollene BRAVO, MD  insulin  aspart (NOVOLOG ) 100 UNIT/ML injection Inject 0-6 Units into the skin 3 (three) times daily with meals.  CBG 70 - 120: 0 units  CBG 121 - 150: 0 units  CBG 151 - 200: 1 unit  CBG 201-250: 2 units  CBG 251-300: 3 units  CBG 301-350: 4 units  CBG 351-400: 5 units  CBG > 400: Give 10 units and call MD 11/12/23   Willette Adriana LABOR, MD  insulin  glargine, 2 Unit Dial , (TOUJEO  MAX SOLOSTAR) 300  UNIT/ML Solostar Pen Inject 30 Units into the skin daily. May need to slowly increase the dose depending upon your blood sugar, follow-up with PCP 01/01/23   Trixie File, MD  metoprolol  succinate (TOPROL -XL) 50 MG 24 hr tablet TAKE (1) TABLET BY MOUTH DAILY WITH FOOD *TAKE AFTER DIALYSIS* Patient taking differently: Take 50 mg by mouth every Tuesday, Thursday, Saturday, and Sunday. Non-dialysis days 01/05/23   Antonetta Rollene BRAVO, MD  ondansetron  (ZOFRAN ) 4 MG tablet Take 4 mg by mouth every 8 (eight) hours as needed for nausea or vomiting. 11/26/23   [provider]  sertraline  (ZOLOFT ) 50 MG tablet Take one and a half tablets once daily 11/24/23   Antonetta Rollene BRAVO, MD  torsemide  (DEMADEX ) 100 MG tablet Take 1 tablet (100 mg total) by mouth every Tuesday, Thursday, Saturday, and Sunday. Patient taking differently: Take 100 mg by mouth every Tuesday, Thursday, Saturday, and Sunday. Non-dialysis days 10/28/23   Pearlean Manus, MD  [name]  500 MG chewable tablet Chew 1-2 tablets (500-1,000 mg total) by mouth See admin instructions. Take 1000mg  (2 tablets) by mouth with meals and 500mg  (1 tablet) with snacks 08/23/23   Antonetta Rollene BRAVO, MD  FLUoxetine (PROZAC) 10 MG capsule Take 10 mg by mouth daily.    05/28/11  [provider]  glipiZIDE  (GLUCOTROL ) 10 MG tablet Take 10 mg by mouth 2 (two) times daily before a meal.    05/28/11  [provider]     Allergies:     Allergies  Allergen Reactions   Ace Inhibitors Anaphylaxis and Swelling   Penicillins Itching and Swelling    Has tolerated cefazolin  on multiple occasions    Statins Other (See Comments)    Elevated LFT's   Albuterol  Swelling     Physical Exam:   Vitals  Blood pressure (!) 150/61, pulse 70, temperature 98.1 F (36.7 C), temperature source Oral, resp. rate 16, height 5' 4 (1.626 m), weight 66.7 kg, SpO2 98%.   1. General Frail elderly female, ill-appearing, laying in bed, no apparent  distress  2.  Awake, alert, pleasant, answering questions appropriately  3. No F.N deficits, ALL C.Nerves Intact, L BKA, left arm bandaged   4. Ears and Eyes appear Normal, Conjunctivae clear, PERRLA. Moist Oral Mucosa.  5. Supple Neck, No JVD, No cervical lymphadenopathy appriciated, No Carotid Bruits.  6. Symmetrical Chest wall movement, Good air movement bilaterally, CTAB.  7. RRR, No Gallops, Rubs , No Parasternal Heave.  8. Positive Bowel Sounds, Abdomen Soft, No tenderness, No organomegaly appriciated,No rebound -guarding or rigidity.  Left hand bandaged, unwrapped it, with gangrenous looking hand,, third finger amputation ,sutures still present please see pictures below       Data Review:    CBC Recent Labs  Lab 12/01/23 1408  WBC 7.1  HGB 6.8*  HCT 22.8*  PLT 288  MCV 82.9  MCH 24.7*  MCHC 29.8*  RDW 19.5*   ------------------------------------------------------------------------------------------------------------------  Chemistries  Recent Labs  Lab 12/01/23 1408  NA 141  K 3.9  CL 99  CO2 28  GLUCOSE 209*  BUN 15  CREATININE 2.15*  CALCIUM  8.6*  AST 18  ALT 8  ALKPHOS 81  BILITOT 0.4   ------------------------------------------------------------------------------------------------------------------ estimated creatinine clearance is 23.8 mL/min (A) (by C-G formula based on SCr of 2.15 mg/dL (H)). ------------------------------------------------------------------------------------------------------------------ No results for input(s): TSH, T4TOTAL, T3FREE, THYROIDAB in the last 72 hours.  Invalid input(s): FREET3  Coagulation profile No results for input(s): INR, PROTIME in the last 168 hours. ------------------------------------------------------------------------------------------------------------------- No results for input(s): DDIMER in the last 72  hours. -------------------------------------------------------------------------------------------------------------------  Cardiac Enzymes No results for input(s): CKMB, TROPONINI, MYOGLOBIN in the last 168 hours.  Invalid input(s): CK ------------------------------------------------------------------------------------------------------------------    Component Value Date/Time   BNP 2,982.0 (H) 12/28/2022 0842     ---------------------------------------------------------------------------------------------------------------  Urinalysis    Component Value Date/Time   COLORURINE AMBER (A) 11/10/2023 1101   APPEARANCEUR TURBID (A) 11/10/2023 1101   LABSPEC 1.017 11/10/2023 1101   PHURINE 5.0 11/10/2023 1101   GLUCOSEU NEGATIVE 11/10/2023 1101   HGBUR LARGE (A) 11/10/2023 1101   BILIRUBINUR NEGATIVE 11/10/2023 1101   BILIRUBINUR neg 02/15/2017 0913   KETONESUR NEGATIVE 11/10/2023 1101   PROTEINUR 100 (A) 11/10/2023 1101   UROBILINOGEN 0.2 02/15/2017 0913   NITRITE NEGATIVE 11/10/2023 1101   LEUKOCYTESUR MODERATE (A) 11/10/2023 1101    ----------------------------------------------------------------------------------------------------------------   Imaging Results:    No results found.  EKG:  Vent. rate 72 BPM PR interval 145 ms QRS duration 90 ms QT/QTcB 427/468 ms P-R-T axes 41 50 121 Sinus rhythm Atrial premature complex Abnormal T, consider ischemia, lateral leads Minimal ST elevation, anterior leads  Assessment & Plan:    Principal Problem:   GI bleed Active Problems:   Afib (HCC)   ESRD on hemodialysis (HCC)   OSA (obstructive sleep apnea)   Ischemic heart disease   Uncontrolled type 2 diabetes mellitus with hyperglycemia (HCC)   Symptomatic anemia Anemia of chronic kidney disease Hemoccult positive stool -Patient sent from HD center by her nephrologist given anemia of 6.8 - Will transfuse 1 unit PRBC. - Last dose Eliquis  Eliquis  was  yesterday evening, will hold for now. - Keep on clear liquid diet, IV Protonix , will await further GI recommendation in AM.  ESRD -Dialysis Monday Wednesday Friday scheduled, she finished her HD today, no indication for urgent dialysis, routine consult for nephrology was placed in epic    Afib  - Heart rate is controlled, continue with Toprol -XL -Hold Eliquis  due to above.  Depression - Review home medication   History of CVA - Continue statins, holding Eliquis     Chronic diastolic (congestive) heart failure (HCC) - Start HD today, euvolemic, volume management with dialysis  -echo 06/15/2023, EF 55%.  No LVH,. Left ventricular diastolic parameters are consistent with Grade I diastolic dysfunction (impaired relaxation). Elevated left ventricular end-diastolic pressure.    HTN - Continue with metoprolol , will hold Norvasc  today, can resume tomorrow if blood pressure is elevated, continue with as needed hydralazine  meanwhile     Hyperlipidemia  - Continue statins   GAD (generalized anxiety disorder) - As needed benzodiazepines, will monitor due to confusion    PVD Bilateral BKA - Noted   Diabetes mellitus type II, insulin -dependent - Last A1c 6.7 - She is on Semglee  30 units daily, will resume at 15 units and add insulin  sliding scale as she is on clear liquid diet  Right buttock pressure ulcer -Consult wound care   DVT prophylaxis: This post bilateral BKA, so cannot apply SCDs, cannot do pharmacologic prophylaxis due to GI bleed   Family Communication: Admission, patients condition and plan of  care including tests being ordered have been discussed with the patient and son and daughter at bedside who indicate understanding and agree with the plan and Code Status.  Code Status full code  Likely DC to home  Consults called: GI called by ED, renal consulted in epic  Admission status: Inpatient  Time spent in minutes : 70 minutes   Brayton Lye M.D on 12/01/2023  at 5:17 PM   [facility] - Office  [phone]

## 2023-12-01 NOTE — Plan of Care (Signed)
  Problem: Clinical Measurements: Goal: Will remain free from infection Outcome: Progressing Goal: Diagnostic test results will improve Outcome: Progressing Goal: Respiratory complications will improve Outcome: Progressing   Problem: Nutrition: Goal: Adequate nutrition will be maintained Outcome: Progressing   Problem: Safety: Goal: Ability to remain free from injury will improve Outcome: Progressing

## 2023-12-01 NOTE — ED Provider Notes (Signed)
 [place] EMERGENCY DEPARTMENT AT [facility] Provider Note   CSN: [id number] Arrival date & time: 12/01/23  1326     Patient presents with: low hemoglobin   Kathryn Beck is a 68 y.o. female.  History of lupus, heart murmur, anemia, type 2 diabetes, CAD, bilateral BKA, ESRD on hemodialysis Monday Wednesday Friday, fully dialyzed today but was sent to the ER due to anemia with hemoglobin 6.3.  Last baseline was 8.4 on 8/25 per Dr. Dennise.  She is on apixaban  which they have had her stop for the time being, and sent her to the ED for transfusion of 1 unit of packed red blood cells and further evaluation.   HPI     Prior to Admission medications   Medication Sig Start Date End Date Taking? Authorizing Provider  acetaminophen  (TYLENOL ) 500 MG tablet Take 1,000 mg by mouth every 6 (six) hours as needed for mild pain (pain score 1-3).   Yes [provider]  amLODipine  (NORVASC ) 10 MG tablet Take 0.5 tablets (5 mg total) by mouth daily. 10/28/23  Yes Emokpae, Courage, MD  apixaban  (ELIQUIS ) 5 MG TABS tablet Take 1 tablet (5 mg total) by mouth 2 (two) times daily. For Afib 10/29/23  Yes Emokpae, Courage, MD  aspirin  81 MG chewable tablet Chew 2 tablets (162 mg total) by mouth daily. 09/16/23  Yes Bevely Doffing, FNP  B Complex-C-Folic Acid  (RENA-VITE RX) 1 MG TABS Take 1 tablet by mouth daily. 04/15/22  Yes [provider]  busPIRone  (BUSPAR ) 7.5 MG tablet Take 1 tablet (7.5 mg total) by mouth 2 (two) times daily. 11/24/23  Yes Antonetta Rollene BRAVO, MD  calcitRIOL  (ROCALTROL ) 0.5 MCG capsule Take 2 capsules (1 mcg total) by mouth every Monday, Wednesday, and Friday with hemodialysis. 11/05/23  Yes Amin, Ankit C, MD  CEFAZOLIN  SODIUM IV Inject into the vein every Monday, Wednesday, and Friday with hemodialysis. 11/30/23 01/11/24 Yes [provider]  cinacalcet  (SENSIPAR ) 30 MG tablet Take 3 tablets (90 mg total) by mouth 3 (three) times a week. Patient taking  differently: Take 90 mg by mouth every Monday, Wednesday, and Friday. 09/09/22  Yes Sheikh, Omair Latif, DO  Darbepoetin Alfa  (ARANESP ) 40 MCG/0.4ML SOSY injection Inject 0.4 mLs (40 mcg total) into the skin every Friday at 6 PM. Patient taking differently: Inject 40 mcg into the vein every Friday at 6 PM. 11/05/23  Yes Amin, Ankit C, MD  ezetimibe  (ZETIA ) 10 MG tablet Take 1 tablet (10 mg total) by mouth daily. 04/27/23  Yes Antonetta Rollene BRAVO, MD  insulin  aspart (NOVOLOG ) 100 UNIT/ML injection Inject 0-6 Units into the skin 3 (three) times daily with meals. CBG 70 - 120: 0 units  CBG 121 - 150: 0 units  CBG 151 - 200: 1 unit  CBG 201-250: 2 units  CBG 251-300: 3 units  CBG 301-350: 4 units  CBG 351-400: 5 units  CBG > 400: Give 10 units and call MD 11/12/23  Yes Shahmehdi, Seyed A, MD  insulin  glargine, 2 Unit Dial , (TOUJEO  MAX SOLOSTAR) 300 UNIT/ML Solostar Pen Inject 30 Units into the skin daily. May need to slowly increase the dose depending upon your blood sugar, follow-up with PCP 01/01/23  Yes Trixie File, MD  metoprolol  succinate (TOPROL -XL) 50 MG 24 hr tablet TAKE (1) TABLET BY MOUTH DAILY WITH FOOD *TAKE AFTER DIALYSIS* Patient taking differently: Take 50 mg by mouth every Tuesday, Thursday, Saturday, and Sunday. Non-dialysis days 01/05/23  Yes Antonetta Rollene BRAVO, MD  ondansetron  (  ZOFRAN ) 4 MG tablet Take 4 mg by mouth every 8 (eight) hours as needed for nausea or vomiting. 11/26/23  Yes [provider]  sertraline  (ZOLOFT ) 50 MG tablet Take one and a half tablets once daily Patient taking differently: Take 75 mg by mouth daily. Take one and a half tablets once daily 11/24/23  Yes Antonetta Rollene BRAVO, MD  torsemide  (DEMADEX ) 100 MG tablet Take 1 tablet (100 mg total) by mouth every Tuesday, Thursday, Saturday, and Sunday. Patient taking differently: Take 100 mg by mouth every Tuesday, Thursday, Saturday, and Sunday. Non-dialysis days 10/28/23  Yes Emokpae, Courage, MD  [name]   500 MG chewable tablet Chew 1-2 tablets (500-1,000 mg total) by mouth See admin instructions. Take 1000mg  (2 tablets) by mouth with meals and 500mg  (1 tablet) with snacks 08/23/23  Yes Antonetta Rollene BRAVO, MD  FLUoxetine (PROZAC) 10 MG capsule Take 10 mg by mouth daily.    05/28/11  [provider]  glipiZIDE  (GLUCOTROL ) 10 MG tablet Take 10 mg by mouth 2 (two) times daily before a meal.    05/28/11  [provider]    Allergies: Ace inhibitors, Penicillins, Statins, and Albuterol     Review of Systems  Updated Vital Signs BP (!) 153/61   Pulse 70   Temp 98.3 F (36.8 C) (Oral)   Resp 18   Ht 5' 4 (1.626 m)   Wt 69.3 kg   SpO2 100%   BMI 26.22 kg/m   Physical Exam Vitals and nursing note reviewed.  Constitutional:      General: She is not in acute distress.    Appearance: She is well-developed.  HENT:     Head: Normocephalic and atraumatic.  Eyes:     Conjunctiva/sclera: Conjunctivae normal.  Cardiovascular:     Rate and Rhythm: Normal rate and regular rhythm.     Heart sounds: No murmur heard. Pulmonary:     Effort: Pulmonary effort is normal. No respiratory distress.     Breath sounds: Normal breath sounds.  Abdominal:     Palpations: Abdomen is soft.     Tenderness: There is no abdominal tenderness.  Genitourinary:    Rectum: Normal. Guaiac result positive.     Comments: Brown stool, guaiac positive, pressure ulcer noted right buttock, chaperoned by RN Musculoskeletal:        General: No swelling.     Cervical back: Neck supple.  Skin:    General: Skin is warm and dry.     Capillary Refill: Capillary refill takes less than 2 seconds.  Neurological:     General: No focal deficit present.     Mental Status: She is alert and oriented to person, place, and time.  Psychiatric:        Mood and Affect: Mood normal.     (all labs ordered are listed, but only abnormal results are displayed) Labs Reviewed  COMPREHENSIVE METABOLIC PANEL WITH GFR -  Abnormal; Notable for the following components:      Result Value   Glucose, Bld 209 (*)    Creatinine, Ser 2.15 (*)    Calcium  8.6 (*)    Total Protein 6.4 (*)    Albumin  3.0 (*)    GFR, Estimated 25 (*)    All other components within normal limits  CBC - Abnormal; Notable for the following components:   RBC 2.75 (*)    Hemoglobin 6.8 (*)    HCT 22.8 (*)    MCH 24.7 (*)    MCHC 29.8 (*)  RDW 19.5 (*)    All other components within normal limits  POC OCCULT BLOOD, ED - Abnormal; Notable for the following components:   Fecal Occult Bld POSITIVE (*)    All other components within normal limits  BASIC METABOLIC PANEL WITH GFR  CBC  TYPE AND SCREEN  PREPARE RBC (CROSSMATCH)    EKG: EKG Interpretation Date/Time:  Wednesday December 01 2023 13:47:23 EDT Ventricular Rate:  72 PR Interval:  145 QRS Duration:  90 QT Interval:  427 QTC Calculation: 468 R Axis:   50  Text Interpretation: Sinus rhythm Atrial premature complex Abnormal T, consider ischemia, lateral leads Minimal ST elevation, anterior leads Confirmed by Yolande Charleston [phone]) on 12/01/2023 1:51:30 PM  Radiology: No results found.   Procedures   Medications Ordered in the ED  ezetimibe  (ZETIA ) tablet 10 mg (has no administration in time range)  metoprolol  succinate (TOPROL -XL) 24 hr tablet 50 mg (has no administration in time range)  torsemide  (DEMADEX ) tablet 100 mg (has no administration in time range)  acetaminophen  (TYLENOL ) tablet 650 mg (has no administration in time range)    Or  acetaminophen  (TYLENOL ) suppository 650 mg (has no administration in time range)  hydrALAZINE  (APRESOLINE ) injection 5 mg (has no administration in time range)  pantoprazole  (PROTONIX ) injection 40 mg (has no administration in time range)  ceFAZolin  (ANCEF ) IVPB 2g/100 mL premix (has no administration in time range)  insulin  aspart (novoLOG ) injection 0-6 Units (has no administration in time range)  insulin  glargine  (LANTUS ) injection 15 Units (has no administration in time range)  multivitamin (RENA-VIT) tablet 1 tablet (has no administration in time range)  sucroferric oxyhydroxide ([name] ) chewable tablet 1,000 mg (has no administration in time range)  sucroferric oxyhydroxide ([name] ) chewable tablet 500 mg (has no administration in time range)  0.9 %  sodium chloride  infusion (Manually program via Guardrails IV Fluids) ( Intravenous New Bag/Given 12/01/23 1633)  pantoprazole  (PROTONIX ) injection 40 mg (40 mg Intravenous Given 12/01/23 1628)                                    Medical Decision Making Differential diagnosis includes but not limited to anemia of chronic disease, GI bleed, symptomatic anemia, other  ED course: Patient sent in for drop about 2 g of hemoglobin in 2 weeks.  Patient denied any known black or bloody stools, guaiac was positive on exam however.  She has history of ESRD and did dialyze fully today.  She is also being treated with cefazolin  for MSSA osteomyelitis in her left hand and has been following with hand surgery for this.  She also has a history of bilateral BKA.  She states she has felt fatigued but denies chest pain or shortness of breath.  Vitals are reassuring.  Hemoglobin 6.8.  Patient was agreeable to blood transfusion.  We discussed that there was sign of blood in her stool and would need admission for further evaluation with GI.  I ordered Protonix  for GI bleeding.  Consults: I spoke with Dr. Cindie on-call for GI.  Patient's last dose of Eliquis  was last night, he recommended Protonix , admission to hospitalist service.  They will put her on their list but will not be able to do any sort of procedure until least Friday due to the Eliquis  use.  Discussed with Dr. Sherlon for admission.   Amount and/or Complexity of Data Reviewed Labs: ordered.  Risk Prescription drug management.  Decision regarding hospitalization.        Final diagnoses:  Anemia,  unspecified type  Gastrointestinal hemorrhage, unspecified gastrointestinal hemorrhage type    ED Discharge Orders     None          Suellen Sherran DELENA DEVONNA 12/01/23 1928    Yolande Lamar BROCKS, MD 12/05/23 0009

## 2023-12-01 NOTE — Progress Notes (Addendum)
 Pharmacy Antibiotic Note  Kathryn Beck is a 68 y.o. female admitted on 12/01/2023 with osteomyelitis.  Pharmacy has been consulted for Cefazolin  dosing. Gangrene of finger of left hand, very slow healing.Seen in clinic 11/30/23 and plan to continue IV abx for another month until d/t slow healing. Pt on Cefazolin  2gm IV with every HD on MWF.   Plan: Cefazolin  2gm IV qHD on M-W-F F/u any repeat cultures and clinical progress Monitor V/S, labs  Height: 5' 4 (162.6 cm) Weight: 66.7 kg (147 lb) IBW/kg (Calculated) : 54.7  Temp (24hrs), Avg:98.1 F (36.7 C), Min:98.1 F (36.7 C), Max:98.1 F (36.7 C)  Recent Labs  Lab 12/01/23 1408  WBC 7.1  CREATININE 2.15*    Estimated Creatinine Clearance: 23.8 mL/min (A) (by C-G formula based on SCr of 2.15 mg/dL (H)).    Allergies  Allergen Reactions   Ace Inhibitors Anaphylaxis and Swelling   Penicillins Itching and Swelling    Has tolerated cefazolin  on multiple occasions    Statins Other (See Comments)    Elevated LFT's   Albuterol  Swelling    Antimicrobials this admission: Cefazolin  9/10 >>   Thank you for allowing pharmacy to be a part of this patient's care.  [name], BS Pharm D, BCPS Clinical Pharmacist 12/01/2023 5:19 PM

## 2023-12-01 NOTE — ED Triage Notes (Signed)
 Pt arrived POV with husband sent from dialysis due to low HGB and needs 1 unit of blood.

## 2023-12-02 ENCOUNTER — Encounter: Payer: Self-pay | Admitting: Family Medicine

## 2023-12-02 ENCOUNTER — Ambulatory Visit (HOSPITAL_COMMUNITY): Admitting: Occupational Therapy

## 2023-12-02 DIAGNOSIS — I4891 Unspecified atrial fibrillation: Secondary | ICD-10-CM | POA: Diagnosis not present

## 2023-12-02 DIAGNOSIS — E1165 Type 2 diabetes mellitus with hyperglycemia: Secondary | ICD-10-CM

## 2023-12-02 DIAGNOSIS — I259 Chronic ischemic heart disease, unspecified: Secondary | ICD-10-CM

## 2023-12-02 DIAGNOSIS — K922 Gastrointestinal hemorrhage, unspecified: Secondary | ICD-10-CM | POA: Diagnosis not present

## 2023-12-02 DIAGNOSIS — D62 Acute posthemorrhagic anemia: Secondary | ICD-10-CM | POA: Diagnosis not present

## 2023-12-02 DIAGNOSIS — Z7901 Long term (current) use of anticoagulants: Secondary | ICD-10-CM

## 2023-12-02 DIAGNOSIS — Z992 Dependence on renal dialysis: Secondary | ICD-10-CM

## 2023-12-02 DIAGNOSIS — R195 Other fecal abnormalities: Secondary | ICD-10-CM | POA: Diagnosis not present

## 2023-12-02 DIAGNOSIS — N186 End stage renal disease: Secondary | ICD-10-CM

## 2023-12-02 DIAGNOSIS — K5900 Constipation, unspecified: Secondary | ICD-10-CM | POA: Diagnosis not present

## 2023-12-02 DIAGNOSIS — G4733 Obstructive sleep apnea (adult) (pediatric): Secondary | ICD-10-CM | POA: Diagnosis not present

## 2023-12-02 DIAGNOSIS — R1310 Dysphagia, unspecified: Secondary | ICD-10-CM | POA: Diagnosis not present

## 2023-12-02 LAB — CBC
HCT: 26.3 % — ABNORMAL LOW (ref 36.0–46.0)
Hemoglobin: 7.8 g/dL — ABNORMAL LOW (ref 12.0–15.0)
MCH: 25.4 pg — ABNORMAL LOW (ref 26.0–34.0)
MCHC: 29.7 g/dL — ABNORMAL LOW (ref 30.0–36.0)
MCV: 85.7 fL (ref 80.0–100.0)
Platelets: 264 K/uL (ref 150–400)
RBC: 3.07 MIL/uL — ABNORMAL LOW (ref 3.87–5.11)
RDW: 19 % — ABNORMAL HIGH (ref 11.5–15.5)
WBC: 7.1 K/uL (ref 4.0–10.5)
nRBC: 0.3 % — ABNORMAL HIGH (ref 0.0–0.2)

## 2023-12-02 LAB — GLUCOSE, CAPILLARY
Glucose-Capillary: 111 mg/dL — ABNORMAL HIGH (ref 70–99)
Glucose-Capillary: 103 mg/dL — ABNORMAL HIGH (ref 70–99)
Glucose-Capillary: 99 mg/dL (ref 70–99)
Glucose-Capillary: 173 mg/dL — ABNORMAL HIGH (ref 70–99)

## 2023-12-02 LAB — BASIC METABOLIC PANEL WITH GFR
Anion gap: 11 (ref 5–15)
BUN: 24 mg/dL — ABNORMAL HIGH (ref 8–23)
CO2: 27 mmol/L (ref 22–32)
Calcium: 7.9 mg/dL — ABNORMAL LOW (ref 8.9–10.3)
Chloride: 101 mmol/L (ref 98–111)
Creatinine, Ser: 2.94 mg/dL — ABNORMAL HIGH (ref 0.44–1.00)
GFR, Estimated: 17 mL/min — ABNORMAL LOW (ref >60)
Glucose, Bld: 138 mg/dL — ABNORMAL HIGH (ref 70–99)
Potassium: 4.6 mmol/L (ref 3.5–5.1)
Sodium: 139 mmol/L (ref 135–145)

## 2023-12-02 LAB — BPAM RBC
Blood Product Expiration Date: 202510082359
ISSUE DATE / TIME: 202509101836
Unit Type and Rh: 1700

## 2023-12-02 LAB — TYPE AND SCREEN
ABO/RH(D): B POS
Antibody Screen: NEGATIVE
Unit division: 0

## 2023-12-02 MED ORDER — ONDANSETRON HCL 4 MG/2ML IJ SOLN
4.0000 mg | Freq: Four times a day (QID) | INTRAMUSCULAR | Status: DC | PRN
  Administered 2023-12-03: 4 mg via INTRAVENOUS
  Filled 2023-12-02: qty 2

## 2023-12-02 MED ORDER — CHLORHEXIDINE GLUCONATE CLOTH 2 % EX PADS
6.0000 | MEDICATED_PAD | Freq: Every day | CUTANEOUS | Status: DC
  Administered 2023-12-04 – 2023-12-07 (×4): 6 via TOPICAL

## 2023-12-02 MED ORDER — SODIUM CHLORIDE 0.9 % IV SOLN: INTRAVENOUS | Status: DC

## 2023-12-02 MED ORDER — AMLODIPINE BESYLATE 5 MG PO TABS
5.0000 mg | ORAL_TABLET | Freq: Every day | ORAL | Status: DC
  Administered 2023-12-02 – 2023-12-07 (×6): 5 mg via ORAL
  Filled 2023-12-02 (×6): qty 1

## 2023-12-02 MED ORDER — CINACALCET HCL 30 MG PO TABS
90.0000 mg | ORAL_TABLET | ORAL | Status: DC
Start: 2023-12-03 — End: 2023-12-03
  Administered 2023-12-03: 90 mg via ORAL
  Filled 2023-12-02: qty 3

## 2023-12-02 MED ORDER — PEG 3350-KCL-NA BICARB-NACL 420 G PO SOLR
4000.0000 mL | Freq: Once | ORAL | Status: AC
Start: 2023-12-02 — End: 2023-12-02
  Administered 2023-12-02: 4000 mL via ORAL

## 2023-12-02 MED ORDER — CALCITRIOL 0.25 MCG PO CAPS: 1.0000 ug | ORAL_CAPSULE | ORAL | Status: DC

## 2023-12-02 MED ORDER — POLYETHYLENE GLYCOL 3350 17 G PO PACK
17.0000 g | PACK | Freq: Every day | ORAL | Status: DC
  Administered 2023-12-02 – 2023-12-05 (×3): 17 g via ORAL
  Filled 2023-12-02 (×5): qty 1

## 2023-12-02 MED ORDER — BUSPIRONE HCL 15 MG PO TABS
7.5000 mg | ORAL_TABLET | Freq: Two times a day (BID) | ORAL | Status: DC
  Administered 2023-12-02 – 2023-12-07 (×10): 7.5 mg via ORAL
  Filled 2023-12-02 (×10): qty 1

## 2023-12-02 MED ORDER — SERTRALINE HCL 50 MG PO TABS
75.0000 mg | ORAL_TABLET | Freq: Every day | ORAL | Status: DC
  Administered 2023-12-02 – 2023-12-07 (×6): 75 mg via ORAL
  Filled 2023-12-02 (×6): qty 1

## 2023-12-02 MED ORDER — SILVER SULFADIAZINE 1 % EX CREA
TOPICAL_CREAM | Freq: Two times a day (BID) | CUTANEOUS | Status: DC
  Filled 2023-12-02: qty 85

## 2023-12-02 NOTE — TOC Initial Note (Signed)
 Transition of Care [facility]) - Initial/Assessment Note    Patient Details  Name: Kathryn Beck MRN: [id number] Date of Birth: [birth year]  [facility] CM/SW Contact:    Sharlyne Stabs, RN Phone Number: 12/02/2023, 12:50 PM  Clinical Narrative:         Patient admitted with GI bleed. Considered to be a high risk for readmission. Patient has Case manger with  TOC [facility] to follow an assist with care. She used [facility] in [place] on MWF.  She is active with [facility] for HHPT/OT/RN.  No new need identified. Work up continues. TOC following.    Expected Discharge Plan: Home w Home Health Services Barriers to Discharge: Continued Medical Work up   Patient Goals and CMS Choice Patient states their goals for this hospitalization and ongoing recovery are:: to get better. CMS Medicare.gov Compare Post Acute Care list provided to:: Patient Choice offered to / list presented to : Patient [place] ownership interest in [facility].provided to:: Patient    Expected Discharge Plan and Services       Living arrangements for the past 2 months: Single Family Home                             HH Agency: [facility]        Prior Living Arrangements/Services Living arrangements for the past 2 months: Single Family Home Lives with:: Spouse Patient language and need for interpreter reviewed:: Yes Do you feel safe going back to the place where you live?: Yes      Need for Family Participation in Patient Care: Yes (Comment) Care giver support system in place?: Yes (comment) Current home services: DME Criminal Activity/Legal Involvement Pertinent to Current Situation/Hospitalization: No - Comment as needed  Activities of Daily Living   ADL Screening (condition at time of admission) Independently performs ADLs?: No Does the patient have a NEW difficulty with bathing/dressing/toileting/self-feeding that is expected to last >3 days?:  No Does the patient have a NEW difficulty with getting in/out of bed, walking, or climbing stairs that is expected to last >3 days?: No Does the patient have a NEW difficulty with communication that is expected to last >3 days?: No Is the patient deaf or have difficulty hearing?: No Does the patient have difficulty seeing, even when wearing glasses/contacts?: No Does the patient have difficulty concentrating, remembering, or making decisions?: Yes  Permission Sought/Granted            Permission granted to share info w Relationship: Daughter     Emotional Assessment     Affect (typically observed): Accepting Orientation: : Oriented to Self, Oriented to Place, Oriented to  Time, Oriented to Situation Alcohol / Substance Use: Not Applicable Psych Involvement: No (comment)  Admission diagnosis:  GI bleed [K92.2] Gastrointestinal hemorrhage, unspecified gastrointestinal hemorrhage type [K92.2] Anemia, unspecified type [D64.9] Patient Active Problem List   Diagnosis Date Noted   GI bleed 12/01/2023   SIRS (systemic inflammatory response syndrome) (HCC) 11/10/2023   Abscess of left hand 10/29/2023   Afib (HCC) 10/27/2023   Mass of breast 08/30/2023   Depression, major, single episode, moderate (HCC) 08/30/2023   GAD (generalized anxiety disorder) 08/30/2023   Decreased strength, endurance, and mobility 08/30/2023   Gangrene of finger of left hand (HCC) 08/30/2023   Uncontrolled type 2 diabetes mellitus with hyperglycemia (HCC) 08/30/2023   Hearing loss 04/27/2023   Otalgia of both ears  04/27/2023   CVA (cerebral vascular accident) (HCC) 12/17/2022   Acute confusion 12/10/2022   Generalized weakness 11/18/2022   Paronychia of finger of right hand 11/13/2022   Dehiscence of amputation stump of left lower extremity (HCC) 10/07/2022   Status post below-knee amputation of left lower extremity (HCC) 10/07/2022   Subacute osteomyelitis, left ankle and foot (HCC) 09/03/2022   Left  foot infection 09/03/2022   Amputated left leg (HCC) 09/03/2022   Wound infection after surgery 09/03/2022   Equinus contracture of left ankle 08/15/2022   Acute osteomyelitis of toe of left foot (HCC) 08/13/2022   Peripheral vascular complication 07/21/2022   PVD (peripheral vascular disease) (HCC) 07/18/2022   Toe ulcer, left, limited to breakdown of skin (HCC) 07/13/2022   S/P BKA (below knee amputation), right (HCC) 05/29/2022   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   Gangrene of right foot (HCC) 04/25/2022   PAD (peripheral artery disease) (HCC) 04/25/2022   Other osteoporosis without current pathological fracture 11/10/2021   Hypocalcemia 11/10/2021   Low back pain with left-sided sciatica 10/31/2021   Left ear impacted cerumen 09/18/2021   Morbid obesity (HCC) 03/10/2021   Subungual hematoma of second toe of left foot 10/28/2020   Insomnia 10/28/2020   Memory loss or impairment 09/26/2020   Abnormal MRI, cervical spine 03/04/2020   Right leg weakness 02/28/2020   Knee pain, right 10/23/2019   Carpal tunnel syndrome 06/13/2019   Chronic pain syndrome 06/13/2019   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) 06/13/2019   ESRD on hemodialysis (HCC) 11/22/2018   Ischemic heart disease 09/25/2018   Not currently working due to disabled status 06/13/2018   HTN (hypertension) 06/12/2018   Adrenal mass, left (HCC) 11/09/2017   MGUS (monoclonal gammopathy of unknown significance) 11/05/2017   Chronic diastolic (congestive) heart failure (HCC) 09/20/2017   Bilateral leg weakness 09/09/2017   Adrenal adenoma 09/03/2017   Anemia 03/24/2017   History of colonic polyps 02/10/2017   Dysphagia 11/05/2016   Irritable bowel syndrome 02/04/2016   Cardiac murmur 01/17/2014   Encounter for examination following treatment at hospital 01/15/2014   Back pain with left-sided radiculopathy 09/18/2013   Other seasonal allergic rhinitis 08/22/2012   OSA (obstructive  sleep apnea) 06/02/2011   Neuropathy 04/16/2010   LUPUS ERYTHEMATOSUS, DISCOID 06/05/2008   Hyperlipidemia 06/07/2007   Obesity 06/07/2007   Type 2 diabetes mellitus with hyperglycemia (HCC) 06/07/2007   Diabetes mellitus (HCC) 06/07/2007   PCP:  Antonetta Rollene BRAVO, MD Pharmacy:   [facility] - [place], [place] - [address] [place] [address] Phone: [phone] Fax: [phone]     Social Drivers of Health (SDOH) Social History: SDOH Screenings   Food Insecurity: No Food Insecurity (12/01/2023)  Housing: Low Risk  (12/01/2023)  Transportation Needs: No Transportation Needs (12/01/2023)  Utilities: Not At Risk (12/01/2023)  Alcohol Screen: Low Risk  (05/03/2023)  Depression (PHQ2-9): High Risk (11/24/2023)  Financial Resource Strain: Low Risk  (11/24/2023)  Physical Activity: Inactive (11/24/2023)  Social Connections: Moderately Integrated (12/01/2023)  Recent Concern: Social Connections - Moderately Isolated (11/25/2023)  Stress: Stress Concern Present (11/24/2023)  Tobacco Use: Low Risk  (12/02/2023)  Health Literacy: Adequate Health Literacy (05/03/2023)  Recent Concern: Health Literacy - Inadequate Health Literacy (02/08/2023)   SDOH Interventions:     Readmission Risk Interventions    12/02/2023   12:48 PM 12/18/2022    3:14 PM 05/01/2022    3:54 PM  Readmission Risk Prevention Plan  Transportation Screening Complete Complete Complete  PCP or Specialist Appt within 3-5 Days Not Complete    HRI or Home Care Consult Complete  Complete  Social Work Consult for Recovery Care Planning/Counseling Complete  --  Palliative Care Screening Not Applicable  Not Applicable  Medication Review Oceanographer) Complete Complete Complete  PCP or Specialist appointment within 3-5 days of discharge  Not Complete   HRI or Home Care Consult  Complete   SW Recovery Care/Counseling Consult  Complete   Palliative Care Screening  Not Applicable   Skilled  Nursing Facility  Not Applicable

## 2023-12-02 NOTE — Assessment & Plan Note (Signed)
 Being managed by Vascular will likely need amputation of the hand

## 2023-12-02 NOTE — Assessment & Plan Note (Signed)
 Increase buspar to 7.5mg  twice daily

## 2023-12-02 NOTE — Progress Notes (Signed)
 Kathryn Beck     MRN: [id number]      DOB: 11-28-1955  Chief Complaint  Patient presents with   Follow-up    Follow up from hosptial stay. Admitted 08/20-08/22    HPI Kathryn Beck is here for follow up of recent hospitalization ROS Denies recent fever or chills. Denies sinus pressure, nasal congestion, ear pain or sore throat. Denies chest congestion, productive cough or wheezing. Denies chest pains, palpitations and leg swelling Denies abdominal pain, nausea, vomiting,diarrhea or constipation.   Denies dysuria, frequency, hesitancy or incontinence. Denies joint pain, swelling and limitation in mobility. Denies headaches, seizures, numbness, or tingling. Denies depression, anxiety or insomnia. Denies skin break down or rash.   PE  BP (!) 145/65   Pulse 64   Resp 16   Ht 5' 5 (1.651 m)   SpO2 96%   BMI 24.95 kg/m   Patient alert and oriented and in no cardiopulmonary distress.  HEENT: No facial asymmetry, EOMI,     Neck supple .  Chest: Clear to auscultation bilaterally.  CVS: S1, S2 no murmurs, no S3.Regular rate.  ABD: Soft non tender.   Ext: Bilateral lower ext amputee  MS: decreased  ROM spine, shoulders, hips and knees.  Skin: Intact, no ulcerations or rash noted.  Psych: Good eye contact, normal affect. Memory intact anxious and mildly depressed appearing.  CNS: CN 2-12 intact, power,  normal throughout.no focal deficits noted.   Assessment & Plan  Encounter for examination following treatment at hospital Patient in for follow up of recent hospitalization.from 8/20 to 11/12/2023 Discharge summary, and laboratory and radiology data are reviewed, and any questions or concerns  are discussed. Specific issues requiring follow up are specifically addressed.   Anemia Refer for GI follow up as no appt scheduled at d/c   Depression, major, single episode, moderate (HCC) Good  response to zoloft , increase dose and re assess in 3 months  Diabetes  mellitus (HCC) Diabetes associated with hypertension, hyperlipidemia, CKD, and vascular disease  Kathryn Beck is reminded of the importance of commitment to daily physical activity for 30 minutes or more, as able and the need to limit carbohydrate intake to 30 to 60 grams per meal to help with blood sugar control.   The need to take medication as prescribed, test blood sugar as directed, and to call between visits if there is a concern that blood sugar is uncontrolled is also discussed.   Kathryn Beck is reminded of the importance of daily foot exam, annual eye examination, and good blood sugar, blood pressure and cholesterol control.     Latest Ref Rng & Units 12/02/2023    5:05 AM 12/01/2023    2:08 PM 11/13/2023    3:50 AM 11/12/2023    4:14 AM 11/11/2023    4:27 AM  Diabetic Labs  Creatinine 0.44 - 1.00 mg/dL 7.05  7.84  6.95  5.31  3.69       12/02/2023    4:47 AM 12/02/2023    1:06 AM 12/01/2023   10:24 PM 12/01/2023    6:56 PM 12/01/2023    6:41 PM 12/01/2023    5:32 PM 12/01/2023    5:00 PM  BP/Weight  Systolic BP 163 177 171 153 156 143 150  Diastolic BP 67 72 73 61 60 59 61  Wt. (Lbs)      152.78   BMI      26.22 kg/m2       Latest Ref Rng & Units 04/16/2022  11:20 AM 07/24/2021    3:42 PM  Foot/eye exam completion dates  Eye Exam No Retinopathy  No Retinopathy      Foot Form Completion  Done      This result is from an external source.        HTN (hypertension) FAIRLY WELL CONTROLLED, NO MED CHNAGE  Gangrene of finger of left hand (HCC) Being managed by Vascular will likely need amputation of the hand  GAD (generalized anxiety disorder) Increase buspar  to 7.5 mg twice daily

## 2023-12-02 NOTE — Evaluation (Signed)
 Occupational Therapy Evaluation Patient Details Name: Kathryn Beck MRN: [id number] DOB: April 01, 1955 Today's Date: 12/02/2023   History of Present Illness   Kathryn Beck  is a 68 y.o. female, with significant history of Atrial fib on apixaban , PAD  (s/p R BKA in 05/2022, prior lithotripsy and stenting of the left superficial femoral and popliteal artery in 06/2022 and ultimately L BKA in 09/2022), HTN, HLD, Type 2 DM, bilateral adrenal adenoma (followed by Endocrinology), chronic HFpEF, aortic stenosis, OSA, ESRD (MWF), history of TIA's, history of recurrent chest pain  (cath in 12/2018 showing normal coronary arteries) recent hospitalization for left hand I&D, MSSA infection on antibiotics IV Ancef  with HD (per MD)     Clinical Impressions Pt agreeable to OT and PT co-evaluation. Pt is assisted much at home. Daughter present to confirm living history. Today pt completed bed mobility without assist, but did have difficulty scooting to L and R side in bed. L hand wrapped at time of evaluation. L UE weak at the shoulder as well today. Pt left in the bed with call bell within reach and family present. Pt will benefit from continued OT in the hospital to increase strength, balance, and endurance for safe ADL's.      If plan is discharge home, recommend the following:   A lot of help with walking and/or transfers;Assistance with cooking/housework;Assist for transportation;Help with stairs or ramp for entrance;A lot of help with bathing/dressing/bathroom     Functional Status Assessment   Patient has had a recent decline in their functional status and demonstrates the ability to make significant improvements in function in a reasonable and predictable amount of time.     Equipment Recommendations   None recommended by OT             Precautions/Restrictions   Precautions Precautions: Fall Recall of Precautions/Restrictions: Intact Precaution/Restrictions Comments: B  BKA Splint/Cast: L hand wrap. Restrictions Weight Bearing Restrictions Per Provider Order: No LUE Weight Bearing Per Provider Order: Non weight bearing (per RN)     Mobility Bed Mobility Overal bed mobility: Needs Assistance Bed Mobility: Sit to Supine, Sidelying to Sit, Rolling Rolling: Modified independent (Device/Increase time), Used rails Sidelying to sit: Supervision, HOB elevated   Sit to supine: Supervision   General bed mobility comments: labored movement but no physical assist to sit up in bed and go back to supine. Pt did struggle to scoot laterally.    Transfers                   General transfer comment: Non-ambulatory ; uses hoyer lift or sliding board at baseline.      Balance Overall balance assessment: Needs assistance Sitting-balance support: No upper extremity supported, Feet supported Sitting balance-Leahy Scale: Fair Sitting balance - Comments: Fair/good seated EOB with RUE support                                   ADL either performed or assessed with clinical judgement   ADL Overall ADL's : Needs assistance/impaired     Grooming: Minimal assistance;Sitting   Upper Body Bathing: Minimal assistance;Sitting   Lower Body Bathing: Moderate assistance;Maximal assistance;Sitting/lateral leans   Upper Body Dressing : Minimal assistance;Sitting   Lower Body Dressing: Moderate assistance;Maximal assistance;Sitting/lateral leans Lower Body Dressing Details (indicate cue type and reason): Assisted by daughter to don stockings to B stumps in bed today. Toilet Transfer: Moderate assistance;Maximal Arboriculturist  Details (indicate cue type and reason): Based on ability to scoot laterally in bed. Toileting- Clothing Manipulation and Hygiene: Moderate assistance;Sitting/lateral lean;Maximal assistance         General ADL Comments: Pt is assited much at home and family reports ability to continue assisting.      Vision Baseline Vision/History: 1 Wears glasses Ability to See in Adequate Light: 0 Adequate Patient Visual Report: No change from baseline Vision Assessment?: Wears glasses for reading;No apparent visual deficits     Perception Perception: Not tested       Praxis Praxis: Not tested       Pertinent Vitals/Pain Pain Assessment Pain Assessment: 0-10 Pain Score: 8  Pain Location: L hand Pain Descriptors / Indicators: Aching Pain Intervention(s): Monitored during session, Repositioned     Extremity/Trunk Assessment Upper Extremity Assessment Upper Extremity Assessment: RUE deficits/detail;LUE deficits/detail RUE Deficits / Details: WFL LUE Deficits / Details: 3-/5 shoulder flexion; near full P/ROM for shoulder flexion; L hand and wrist wrapped due to significant wound. LUE Coordination: decreased fine motor   Lower Extremity Assessment Lower Extremity Assessment: Defer to PT evaluation       Communication Communication Communication: No apparent difficulties   Cognition Arousal: Alert Behavior During Therapy: WFL for tasks assessed/performed Cognition: No apparent impairments             OT - Cognition Comments: Mild delay in following commands.                 Following commands: Impaired Following commands impaired: Follows one step commands with increased time     Cueing  General Comments   Cueing Techniques: Verbal cues;Tactile cues      Exercises     Shoulder Instructions      Home Living Family/patient expects to be discharged to:: Private residence Living Arrangements: Spouse/significant other Available Help at Discharge: Family;Available PRN/intermittently Type of Home: House Home Access: Stairs to enter [employer] of Steps: 2 Entrance Stairs-Rails: None Home Layout: Two level;Able to live on main level with bedroom/bathroom Alternate Level Stairs-Number of Steps: Flight   Bathroom Shower/Tub: Scientist, research (life sciences): Standard Bathroom Accessibility: Yes How Accessible: Accessible via wheelchair Home Equipment: BSC/3in1;Wheelchair - Forensic psychologist (2 wheels);Cane - single point;Grab bars - tub/shower;Wheelchair - power;Hand held shower head;Other (comment);Hospital bed   Additional Comments: per chart and family confirmation      Prior Functioning/Environment Prior Level of Function : Needs assist       Physical Assist : Mobility (physical);ADLs (physical) Mobility (physical): Bed mobility;Transfers ADLs (physical): Bathing;Dressing;IADLs;Toileting Mobility Comments: Pt uses hoyer lift and sliding board for transfers at home to w/c. ADLs Comments: Assist for bathing, dressing, grooming, and IADL's. Pt can reported feed herself and complete basic grooming tasks.    OT Problem List: Decreased strength;Decreased range of motion;Decreased activity tolerance;Impaired balance (sitting and/or standing);Pain   OT Treatment/Interventions: Therapeutic exercise;Self-care/ADL training;Therapeutic activities;Patient/family education;Balance training;DME and/or AE instruction      OT Goals(Current goals can be found in the care plan section)   Acute Rehab OT Goals Patient Stated Goal: return home OT Goal Formulation: With patient/family Time For Goal Achievement: 12/16/23 Potential to Achieve Goals: Good   OT Frequency:  Min 1X/week    Co-evaluation PT/OT/SLP Co-Evaluation/Treatment: Yes Reason for Co-Treatment: To address functional/ADL transfers   OT goals addressed during session: ADL's and self-care                       End of Session  Activity Tolerance: Patient tolerated treatment well Patient left: in bed;with call bell/phone within reach;with family/visitor present  OT Visit Diagnosis: Muscle weakness (generalized) (M62.81) Pain - Right/Left: Left Pain - part of body: Hand                Time: 9166-9154 OT Time Calculation (min): 12  min Charges:  OT General Charges $OT Visit: 1 Visit OT Evaluation $OT Eval Low Complexity: 1 Low  Kathryn Beck OT, MOT  Jayson Person 12/02/2023, 10:27 AM

## 2023-12-02 NOTE — Assessment & Plan Note (Signed)
 Patient in for follow up of recent hospitalization.from 8/20 to 11/12/2023 Discharge summary, and laboratory and radiology data are reviewed, and any questions or concerns  are discussed. Specific issues requiring follow up are specifically addressed.

## 2023-12-02 NOTE — Progress Notes (Signed)
 WOC RN reached out in regard to sacral wound that they were consulted about. Upon assessment there is no wound on the buttocks nor the sacrum. Patient has an old scar next to her rectum that is healed only. No redness or irritation noted. WOC notified. See WOC RN note.

## 2023-12-02 NOTE — Plan of Care (Signed)
  Problem: Acute Rehab PT Goals(only PT should resolve) Goal: Pt will Roll Supine to Side Outcome: Progressing Flowsheets (Taken 12/02/2023 1234) Pt will Roll Supine to Side: with modified independence Goal: Pt Will Go Supine/Side To Sit Outcome: Progressing Flowsheets (Taken 12/02/2023 1234) Pt will go Supine/Side to Sit: with modified independence Goal: Pt Will Go Sit To Supine/Side Outcome: Progressing Flowsheets (Taken 12/02/2023 1234) Pt will go Sit to Supine/Side: with modified independence Goal: Patient Will Perform Sitting Balance Outcome: Progressing Flowsheets (Taken 12/02/2023 1234) Patient will perform sitting balance:  Independently  3- 5 min

## 2023-12-02 NOTE — Plan of Care (Signed)
  Problem: Acute Rehab OT Goals (only OT should resolve) Goal: Pt. Will Perform Upper Body Dressing Flowsheets (Taken 12/02/2023 1028) Pt Will Perform Upper Body Dressing: with modified independence Goal: Pt. Will Perform Lower Body Dressing Flowsheets (Taken 12/02/2023 1028) Pt Will Perform Lower Body Dressing: with modified independence Goal: Pt. Will Transfer To Toilet Flowsheets (Taken 12/02/2023 1028) Pt Will Transfer to Toilet:  with transfer board  with contact guard assist  with min assist Goal: Pt. Will Perform Toileting-Clothing Manipulation Flowsheets (Taken 12/02/2023 1028) Pt Will Perform Toileting - Clothing Manipulation and hygiene:  with supervision  sitting/lateral leans Goal: Pt/Caregiver Will Perform Home Exercise Program Flowsheets (Taken 12/02/2023 1028) Pt/caregiver will Perform Home Exercise Program:  Increased ROM  Increased strength  Left upper extremity  Independently  Kathryn Beck OT, MOT

## 2023-12-02 NOTE — Progress Notes (Signed)
 PROGRESS NOTE   Kathryn Beck  FMW:[id number] DOB: [birth year] DOA: 12/01/2023 PCP: Antonetta Rollene BRAVO, MD   Chief Complaint  Patient presents with   low hemoglobin   Level of care: Telemetry  Brief Admission History:  68 y.o. female, with significant history of Atrial fib on apixaban , PAD  (s/p R BKA in 05/2022, prior lithotripsy and stenting of the left superficial femoral and popliteal artery in 06/2022 and ultimately L BKA in 09/2022), HTN, HLD, Type 2 DM, bilateral adrenal adenoma (followed by Endocrinology), chronic HFpEF, aortic stenosis, OSA, ESRD (MWF), history of TIA's, history of recurrent chest pain  (cath in 12/2018 showing normal coronary arteries) recent hospitalization for left hand I&D, MSSA infection on antibiotics IV Ancef  with HD - She was sent by nephrologist after HD for anemia and low hemoglobin, labs done at the HD center noted for hemoglobin of 6.3, most recent baseline was 8.4 on 8/25, he is on apixaban , sent to ED for further evaluation.  In ED hemoglobin noted to be 6.8, he was Hemoccult positive, but stools were normal in color in rectal exam the ED physician, she was ordered 1 unit PRBC, ED discussed with GI who recommended clear liquid diet, holding Eliquis , starting on Protonix  and to admit to hospitalist service.     Assessment and Plan:  Symptomatic anemia Anemia of chronic kidney disease Hemoccult positive stool - Patient sent from HD center by her nephrologist given anemia of 6.8 - Will transfuse 1 unit PRBC. - Last dose Eliquis  Eliquis  was yesterday evening, will hold for now. - Keep on clear liquid diet, IV Protonix  - GI team consultation and recommendations are appreciated    ESRD -Dialysis Monday Wednesday Friday scheduled -appreciate nephrology team consultation and recommendations    Afib  -Heart rate is controlled, continue with Toprol -XL -Hold apixaban  due to above.   Depression - resumed home meds   History of CVA - Continue  statins, holding Eliquis      Chronic diastolic (congestive) heart failure -Start HD today, euvolemic, volume management with dialysis and diuretics as she makes some urine  -echo 06/15/2023, EF 55%.  No LVH,. Left ventricular diastolic parameters are consistent with Grade I diastolic dysfunction (impaired relaxation). Elevated left ventricular end-diastolic pressure.    HTN - Continue with metoprolol , will hold Norvasc  today, can resume tomorrow if blood pressure is elevated, continue with as needed hydralazine  meanwhile    Hyperlipidemia  - Continue statins   GAD (generalized anxiety disorder) - As needed benzodiazepines, will monitor due to confusion    PVD Bilateral BKA - Noted   Diabetes mellitus type II, insulin -dependent - Last A1c 6.7 - She is on Semglee  30 units daily, will resume at 15 units and add insulin  sliding scale as she is on clear liquid diet   Right buttock pressure ulcer -Consult wound care   DVT prophylaxis: This post bilateral BKA, so cannot apply SCDs, cannot do pharmacologic prophylaxis due to GI bleed  Code Status: Full  Family Communication: bedside update today Disposition: anticipate home    Consultants:  GI Nephrology  Procedures:   Antimicrobials:    Subjective: Pt reports no specific complaints.   Objective: Vitals:   12/01/23 2224 12/02/23 0106 12/02/23 0447 12/02/23 1339  BP: (!) 171/73 (!) 177/72 (!) 163/67 (!) 166/77  Pulse: 67 72 69 65  Resp: 16 18 20 17   Temp: 97.6 F (36.4 C) 97.8 F (36.6 C) 97.9 F (36.6 C) 98 F (36.7 C)  TempSrc: Oral Oral Oral  SpO2: 98% 98% 100%   Weight:      Height:        Intake/Output Summary (Last 24 hours) at 12/02/2023 1454 Last data filed at 12/02/2023 1230 Gross per 24 hour  Intake 1400 ml  Output --  Net 1400 ml   Filed Weights   12/01/23 1347 12/01/23 1732  Weight: 66.7 kg 69.3 kg   Examination:  General exam: Appears calm and comfortable  Respiratory system: Clear to  auscultation. Respiratory effort normal. Cardiovascular system: normal S1 & S2 heard. No JVD, murmurs, rubs, gallops or clicks. No pedal edema. Gastrointestinal system: Abdomen is nondistended, soft and nontender. No organomegaly or masses felt. Normal bowel sounds heard. Central nervous system: Alert and oriented. No focal neurological deficits. 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  Skin: No rashes, lesions or ulcers. Psychiatry: Judgement and insight appear normal. Mood & affect appropriate.   Data Reviewed: I have personally reviewed following labs and imaging studies  CBC: Recent Labs  Lab 12/01/23 1408 12/02/23 0505  WBC 7.1 7.1  HGB 6.8* 7.8*  HCT 22.8* 26.3*  MCV 82.9 85.7  PLT 288 264    Basic Metabolic Panel: Recent Labs  Lab 12/01/23 1408 12/02/23 0505  NA 141 139  K 3.9 4.6  CL 99 101  CO2 28 27  GLUCOSE 209* 138*  BUN 15 24*  CREATININE 2.15* 2.94*  CALCIUM  8.6* 7.9*    CBG: Recent Labs  Lab 12/01/23 2140 12/02/23 0730 12/02/23 1149  GLUCAP 181* 111* 103*    No results found for this or any previous visit (from the past 240 hours).   Radiology Studies: No results found.  Scheduled Meds:  [START ON 12/03/2023] Chlorhexidine  Gluconate Cloth  6 each Topical Q0600   ezetimibe   10 mg Oral Daily   insulin  aspart  0-6 Units Subcutaneous TID WC   insulin  glargine  15 Units Subcutaneous QHS   metoprolol  succinate  50 mg Oral Q T,Th,S,Su   multivitamin  1 tablet Oral QHS   pantoprazole  (PROTONIX ) IV  40 mg Intravenous Q12H   polyethylene glycol  17 g Oral Daily   silver  sulfADIAZINE    Topical Q12H   sucroferric oxyhydroxide  1,000 mg Oral TID WC   torsemide   100 mg Oral Q T,Th,S,Su   Continuous Infusions:  [START ON 12/03/2023]  ceFAZolin  (ANCEF ) IV       LOS: 1 day   Time spent: 57 mins  Anacleto Batterman, MD How to contact the [facility] Attending or Consulting provider 7A - 7P or covering provider during after hours 7P  -7A, for this patient?  Check the care team in [facility] and look for a) attending/consulting [facility] provider listed and b) the [facility] team listed Log into [email or web address] to find provider on call.  Locate the [facility] provider you are looking for under [facility] and page to a number that you can be directly reached. If you still have difficulty reaching the provider, please page the [facility] (Director on Call) for the Hospitalists listed on amion for assistance.  12/02/2023, 2:54 PM

## 2023-12-02 NOTE — Consult Note (Signed)
 [facility] Renal Consultation Note  Requesting MD: Afton Louder, MD Indication for Consultation:  ESRD  Chief complaint: anemia noted at outpatient HD unit  HPI:  Kathryn Beck is a 68 y.o. female with history of ESRD Monday Wednesday Friday at [facility], atrial fibrillation, peripheral artery disease, hypertension, type 2 diabetes melitis, chronic HFpEF, and aortic stenosis who presented to [facility] ER from her outpatient HD unit with anemia.  When I initially asked why she came she states I think it's for my kidneys.  She had Hb 6.3 on outpatient labs per charting.  She states that she has had a little blood in her bowel movements for a while stating that she has seen blood on the toilet paper.  She was found to have a hemoglobin of 6.8 here.  She was noted to be FOBT positive.  She received 1 unit of PRBCs.  She states she doesn't really feel that different after the PRBC's.  Nephrology is consulted for assistance with management of end-stage renal disease.  She had her full dialysis treatment yesterday.  Note that she has been on cefazolin  for osteomyelitis.  She states this is for a wound on her left hand.  She does make a little urine.  Spoke with outpatient HD unit as below.  She was recently re-ordered cefazolin  2 gram with HD from 9/9 until 01/10/24.  She got on 12/01/23   PMHx:   Past Medical History:  Diagnosis Date   Acid reflux    Amputated left leg (HCC)    bilateral BKA   Anemia    Arthritis    Axillary masses    Soft tissue - status post excision   Back pain    CHF (congestive heart failure) (HCC)    COVID-19 virus infection 04/06/2019   Depression    End-stage renal disease (HCC)    M/W/F dialysis   Essential hypertension    Headache    years ago   History of blood transfusion    History of cardiac catheterization    Normal coronary arteries October 2020   History of claustrophobia    History of pneumonia 2019   Hypoxia  04/03/2019   Memory loss    Mixed hyperlipidemia    Obesity    Pancreatitis    Peritoneal dialysis catheter in place [facility])    Pneumonia due to COVID-19 virus 04/02/2019   Sleep apnea    Noncompliant with CPAP   Stroke (HCC)    mini stroke   Type 2 diabetes mellitus (HCC)     Past Surgical History:  Procedure Laterality Date   ABDOMINAL AORTOGRAM W/LOWER EXTREMITY N/A 04/30/2022   Procedure: ABDOMINAL AORTOGRAM W/LOWER EXTREMITY;  Surgeon: Magda Debby SAILOR, MD;  Location: [facility] INVASIVE CV LAB;  Service: Cardiovascular;  Laterality: N/A;   ABDOMINAL AORTOGRAM W/LOWER EXTREMITY N/A 07/21/2022   Procedure: ABDOMINAL AORTOGRAM W/LOWER EXTREMITY;  Surgeon: Serene Gaile ORN, MD;  Location: [facility] INVASIVE CV LAB;  Service: Cardiovascular;  Laterality: N/A;   ABDOMINAL HYSTERECTOMY     ACHILLES TENDON LENGTHENING  08/15/2022   Procedure: ACHILLES TENDON LENGTHENING;  Surgeon: Silva Juliene SAUNDERS, DPM;  Location: [facility] OR;  Service: Podiatry;;   AMPUTATION Right 05/29/2022   Procedure: RIGHT BELOW THE KNEE AMPUTATION;  Surgeon: Harden Jerona GAILS, MD;  Location: [facility] OR;  Service: Orthopedics;  Laterality: Right;   AMPUTATION Left 09/04/2022   Procedure: AMPUTATION FOOT, serial irrigation;  Surgeon: Joya Stabs, DPM;  Location: [facility] OR;  Service: Podiatry;  Laterality:  Left;  Surgical team to do block   AMPUTATION Left 10/07/2022   Procedure: LEFT BELOW KNEE AMPUTATION;  Surgeon: Harden Jerona GAILS, MD;  Location: [facility] OR;  Service: Orthopedics;  Laterality: Left;   AMPUTATION FINGER Left 09/29/2023   Procedure: AMPUTATION, FINGER;  Surgeon: Harden Jerona GAILS, MD;  Location: [facility] OR;  Service: Orthopedics;  Laterality: Left;  LEFT HAND LONG FINGER RAY AMPUTATION   AV FISTULA PLACEMENT Left 09/02/2017   Procedure: creation of left arm ARTERIOVENOUS (AV) FISTULA;  Surgeon: Serene Gaile ORN, MD;  Location: [facility] OR;  Service: Vascular;  Laterality: Left;   COLONOSCOPY  2008   Dr. Harvey: normal    COLONOSCOPY N/A 12/18/2016   Dr.  Harvey: multiple tubular adenomas, internal hemorrhoids. Surveillance in 3 years    ESOPHAGEAL DILATION N/A 10/13/2015   Procedure: ESOPHAGEAL DILATION;  Surgeon: Claudis RAYMOND Rivet, MD;  Location: [facility];  Service: Endoscopy;  Laterality: N/A;   ESOPHAGOGASTRODUODENOSCOPY N/A 10/13/2015   Dr. Rivet: chronic gastritis on path, no H.pylori. Empiric dilation    ESOPHAGOGASTRODUODENOSCOPY N/A 12/18/2016   Dr. Harvey: mild gastritis. BRAVO study revealed uncontrolled GERD. Dysphagia secondary to uncontrolled reflux   FOOT SURGERY Bilateral    nerve     INCISION AND DRAINAGE OF WOUND Left 10/30/2023   Procedure: IRRIGATION AND DEBRIDEMENT WOUND;  Surgeon: Arlinda Buster, MD;  Location: [facility] OR;  Service: Orthopedics;  Laterality: Left;  LEFT HAND WOUND   LEFT HEART CATH AND CORONARY ANGIOGRAPHY N/A 12/29/2018   Procedure: LEFT HEART CATH AND CORONARY ANGIOGRAPHY;  Surgeon: Dann Candyce RAMAN, MD;  Location: [facility] INVASIVE CV LAB;  Service: Cardiovascular;  Laterality: N/A;   LOWER EXTREMITY ANGIOGRAPHY Right 05/04/2022   Procedure: Lower Extremity Angiography;  Surgeon: Lanis Fonda BRAVO, MD;  Location: [facility] INVASIVE CV LAB;  Service: Cardiovascular;  Laterality: Right;   LUNG BIOPSY     MASS EXCISION Right 01/09/2013   Procedure: EXCISION OF NEOPLASM OF RIGHT  AXILLA  AND EXCISION OF NEOPLASM OF LEFT AXILLA;  Surgeon: Oneil DELENA Budge, MD;  Location: [facility] ORS;  Service: General;  Laterality: Right;  procedure end @ 08:23   MYRINGOTOMY WITH TUBE PLACEMENT Bilateral 04/28/2017   Procedure: BILATERAL MYRINGOTOMY WITH TUBE PLACEMENT;  Surgeon: Karis Clunes, MD;  Location: [facility] OR;  Service: ENT;  Laterality: Bilateral;   PERIPHERAL VASCULAR BALLOON ANGIOPLASTY Right 05/04/2022   Procedure: PERIPHERAL VASCULAR BALLOON ANGIOPLASTY;  Surgeon: Lanis Fonda BRAVO, MD;  Location: [facility] INVASIVE CV LAB;  Service: Cardiovascular;  Laterality: Right;  PT   PERIPHERAL VASCULAR INTERVENTION Right 05/04/2022   Procedure: PERIPHERAL  VASCULAR INTERVENTION;  Surgeon: Lanis Fonda BRAVO, MD;  Location: [facility] INVASIVE CV LAB;  Service: Cardiovascular;  Laterality: Right;  SFA   PERIPHERAL VASCULAR INTERVENTION Left 07/21/2022   Procedure: PERIPHERAL VASCULAR INTERVENTION;  Surgeon: Serene Gaile ORN, MD;  [facility];  Service: Cardiovascular;  Laterality: Left;   REVISION OF ARTERIOVENOUS GORETEX GRAFT Left 05/04/2018   Procedure: TRANSPOSITION OF CEPHALIC VEIN ARTERIOVENOUS FISTULA LEFT ARM;  Surgeon: Oris Krystal FALCON, MD;  Location: [facility] OR;  Service: Vascular;  Laterality: Left;   SAVORY DILATION N/A 12/18/2016   Procedure: SAVORY DILATION;  Surgeon: Harvey Margo CROME, MD;  [facility];  Service: Endoscopy;  Laterality: N/A;   TRANSMETATARSAL AMPUTATION Left 08/15/2022   Procedure: TRANSMETATARSAL AMPUTATION;  Surgeon: Silva Juliene SAUNDERS, DPM;  Location: [facility] OR;  Service: Podiatry;  Laterality: Left;    Family Hx:  Family History  Problem Relation Age of  Onset   Hypertension Father    Hypercholesterolemia Father    Arthritis Father    Hypertension Sister    Hypercholesterolemia Sister    Breast cancer Sister    Hypertension Sister    Colon cancer Neg Hx    Colon polyps Neg Hx     Social History:  reports that she has never smoked. She has never been exposed to tobacco smoke. She has never used smokeless tobacco. She reports that she does not drink alcohol and does not use drugs.  Allergies:  Allergies  Allergen Reactions   Ace Inhibitors Anaphylaxis and Swelling   Penicillins Itching and Swelling    Has tolerated cefazolin  on multiple occasions    Statins Other (See Comments)    Elevated LFT's   Albuterol  Swelling    Medications: Prior to Admission medications   Medication Sig Start Date End Date Taking? Authorizing Provider  acetaminophen  (TYLENOL ) 500 MG tablet Take 1,000 mg by mouth every 6 (six) hours as needed for mild pain (pain score 1-3).   Yes [provider]  amLODipine   (NORVASC ) 10 MG tablet Take 0.5 tablets (5 mg total) by mouth daily. 10/28/23  Yes Emokpae, Courage, MD  apixaban  (ELIQUIS ) 5 MG TABS tablet Take 1 tablet (5 mg total) by mouth 2 (two) times daily. For Afib 10/29/23  Yes Emokpae, Courage, MD  aspirin  81 MG chewable tablet Chew 2 tablets (162 mg total) by mouth daily. 09/16/23  Yes Bevely Doffing, FNP  B Complex-C-Folic Acid  (RENA-VITE RX) 1 MG TABS Take 1 tablet by mouth daily. 04/15/22  Yes [provider]  busPIRone  (BUSPAR ) 7.5 MG tablet Take 1 tablet (7.5 mg total) by mouth 2 (two) times daily. 11/24/23  Yes Antonetta Rollene BRAVO, MD  calcitRIOL  (ROCALTROL ) 0.5 MCG capsule Take 2 capsules (1 mcg total) by mouth every Monday, Wednesday, and Friday with hemodialysis. 11/05/23  Yes Amin, Ankit C, MD  CEFAZOLIN  SODIUM IV Inject into the vein every Monday, Wednesday, and Friday with hemodialysis. 11/30/23 01/11/24 Yes [provider]  cinacalcet  (SENSIPAR ) 30 MG tablet Take 3 tablets (90 mg total) by mouth 3 (three) times a week. Patient taking differently: Take 90 mg by mouth every Monday, Wednesday, and Friday. 09/09/22  Yes Sheikh, Omair Latif, DO  Darbepoetin Alfa  (ARANESP ) 40 MCG/0.4ML SOSY injection Inject 0.4 mLs (40 mcg total) into the skin every Friday at 6 PM. Patient taking differently: Inject 40 mcg into the vein every Friday at 6 PM. 11/05/23  Yes Amin, Ankit C, MD  ezetimibe  (ZETIA ) 10 MG tablet Take 1 tablet (10 mg total) by mouth daily. 04/27/23  Yes Antonetta Rollene BRAVO, MD  insulin  aspart (NOVOLOG ) 100 UNIT/ML injection Inject 0-6 Units into the skin 3 (three) times daily with meals. CBG 70 - 120: 0 units  CBG 121 - 150: 0 units  CBG 151 - 200: 1 unit  CBG 201-250: 2 units  CBG 251-300: 3 units  CBG 301-350: 4 units  CBG 351-400: 5 units  CBG > 400: Give 10 units and call MD 11/12/23  Yes Shahmehdi, Seyed A, MD  insulin  glargine, 2 Unit Dial , (TOUJEO  MAX SOLOSTAR) 300 UNIT/ML Solostar Pen Inject 30 Units into the skin daily. May  need to slowly increase the dose depending upon your blood sugar, follow-up with PCP 01/01/23  Yes Trixie File, MD  metoprolol  succinate (TOPROL -XL) 50 MG 24 hr tablet TAKE (1) TABLET BY MOUTH DAILY WITH FOOD *TAKE AFTER DIALYSIS* Patient taking differently: Take 50 mg by mouth every Tuesday,  Thursday, Saturday, and Sunday. Non-dialysis days 01/05/23  Yes Antonetta Rollene BRAVO, MD  ondansetron  (ZOFRAN ) 4 MG tablet Take 4 mg by mouth every 8 (eight) hours as needed for nausea or vomiting. 11/26/23  Yes [provider]  sertraline  (ZOLOFT ) 50 MG tablet Take one and a half tablets once daily Patient taking differently: Take 75 mg by mouth daily. Take one and a half tablets once daily 11/24/23  Yes Antonetta Rollene BRAVO, MD  torsemide  (DEMADEX ) 100 MG tablet Take 1 tablet (100 mg total) by mouth every Tuesday, Thursday, Saturday, and Sunday. Patient taking differently: Take 100 mg by mouth every Tuesday, Thursday, Saturday, and Sunday. Non-dialysis days 10/28/23  Yes Emokpae, Courage, MD  [name]  500 MG chewable tablet Chew 1-2 tablets (500-1,000 mg total) by mouth See admin instructions. Take 1000mg  (2 tablets) by mouth with meals and 500mg  (1 tablet) with snacks 08/23/23  Yes Antonetta Rollene BRAVO, MD  FLUoxetine (PROZAC) 10 MG capsule Take 10 mg by mouth daily.    05/28/11  [provider]  glipiZIDE  (GLUCOTROL ) 10 MG tablet Take 10 mg by mouth 2 (two) times daily before a meal.    05/28/11  [provider]    I have reviewed the patient's current medications.  Labs:     Latest Ref Rng & Units 12/02/2023    5:05 AM 12/01/2023    2:08 PM 11/13/2023    3:50 AM  BMP  Glucose 70 - 99 mg/dL 861  790  888   BUN 8 - 23 mg/dL 24  15  22    Creatinine 0.44 - 1.00 mg/dL 7.05  7.84  6.95   Sodium 135 - 145 mmol/L 139  141  138   Potassium 3.5 - 5.1 mmol/L 4.6  3.9  4.1   Chloride 98 - 111 mmol/L 101  99  99   CO2 22 - 32 mmol/L 27  28  30    Calcium  8.9 - 10.3 mg/dL 7.9  8.6  8.0      ROS:  Pertinent items noted in HPI and remainder of comprehensive ROS otherwise negative.  Physical Exam: Vitals:   12/02/23 0106 12/02/23 0447  BP: (!) 177/72 (!) 163/67  Pulse: 72 69  Resp: 18 20  Temp: 97.8 F (36.6 C) 97.9 F (36.6 C)  SpO2: 98% 100%     General:  elderly female in bed in NAD  HEENT:  NCAT Eyes: EOMI sclera anicteric Neck: supple trachea midline  Heart: S1S2 no rub Lungs: clear and unlabored; on room air Abdomen: soft/nt/nd Extremities: bilateral BKA's; left hand is wrapped  Skin: no rash on extremities exposed; left hand wrapped Neuro: alert and oriented x 3 provides hx and follows commands Psych: normal mood and affect Access LUE AVF with bruit and thrill   Outpatient HD Rx:  MWF [facility] 3 hours and 30 minutes  EDW 70.5 kg (she gets to this usually) 2k/ 2.5 ca bath BF 400 DF 500 Heparin  1000 units loading and then 1000 units an hour Meds: mircera 60 mcg every 2 weeks and last given on 11/29/23 [facility] computers are down so some info is limited.  She was recently re-ordered cefazolin  2 gram with HD from 9/9 until 01/10/24.  She got on 12/01/23   Assessment/Plan:  ESRD on HD - HD per MWF schedule - obtained HD orders as above   Anemia with acute blood loss - compounded by anemia of chronic disease - on ESA outpatient with HD - obtain outpatient HD Rx -  GI has seen   HTN - continue home meds for now   Left hand osteomyelitis - she is on cefazolin  which was recently reinitiated - per team's exam concern is that this is gangrenous - per primary team  Chronic diastolic CHF - optimize volume with HD  Metabolic bone disease  - Continue binders when taking PO  - she is on a clear liquid diet currently  Thank you for the consult.  Please do not hesitate to contact me with any questions regarding our patient     [name] 12/02/2023, 1:06 PM

## 2023-12-02 NOTE — Assessment & Plan Note (Signed)
 FAIRLY WELL CONTROLLED, NO MED CHNAGE

## 2023-12-02 NOTE — Consult Note (Signed)
 WOC team consulted for sacral wound.  WOC RN reached out to bedside staff requesting photo documentation of wound. No wound found by primary team  Will sign off. If further needs arise please reconsult.   Thank you,    Powell Bar MSN, RN-BC, [employer]

## 2023-12-02 NOTE — Hospital Course (Signed)
 68 y.o. female, with significant history of Atrial fib on apixaban , PAD  (s/p R BKA in 05/2022, prior lithotripsy and stenting of the left superficial femoral and popliteal artery in 06/2022 and ultimately L BKA in 09/2022), HTN, HLD, Type 2 DM, bilateral adrenal adenoma (followed by Endocrinology), chronic HFpEF, aortic stenosis, OSA, ESRD (MWF), history of TIA's, history of recurrent chest pain  (cath in 12/2018 showing normal coronary arteries) recent hospitalization for left hand I&D, MSSA infection on antibiotics IV Ancef  with HD - She was sent by nephrologist after HD for anemia and low hemoglobin, labs done at the HD center noted for hemoglobin of 6.3, most recent baseline was 8.4 on 8/25, he is on apixaban , sent to ED for further evaluation.  In ED hemoglobin noted to be 6.8, he was Hemoccult positive, but stools were normal in color in rectal exam the ED physician, she was ordered 1 unit PRBC, ED discussed with GI who recommended clear liquid diet, holding Eliquis , starting on Protonix  and to admit to hospitalist service.

## 2023-12-02 NOTE — Assessment & Plan Note (Addendum)
 Refer for GI follow up as no appt scheduled at d/c

## 2023-12-02 NOTE — Progress Notes (Signed)
 Pt receives out-pt HD at [facility], MWF 0700 chair time. Clinic notified of pt admission. Will assist as needed.    [facility] Imane Burrough Dialysis Navigator [phone]

## 2023-12-02 NOTE — Assessment & Plan Note (Signed)
 Diabetes associated with hypertension, hyperlipidemia, CKD, and vascular disease  Kathryn Beck is reminded of the importance of commitment to daily physical activity for 30 minutes or more, as able and the need to limit carbohydrate intake to 30 to 60 grams per meal to help with blood sugar control.   The need to take medication as prescribed, test blood sugar as directed, and to call between visits if there is a concern that blood sugar is uncontrolled is also discussed.   Kathryn Beck is reminded of the importance of daily foot exam, annual eye examination, and good blood sugar, blood pressure and cholesterol control.     Latest Ref Rng & Units 12/02/2023    5:05 AM 12/01/2023    2:08 PM 11/13/2023    3:50 AM 11/12/2023    4:14 AM 11/11/2023    4:27 AM  Diabetic Labs  Creatinine 0.44 - 1.00 mg/dL 7.05  7.84  6.95  5.31  3.69       12/02/2023    4:47 AM 12/02/2023    1:06 AM 12/01/2023   10:24 PM 12/01/2023    6:56 PM 12/01/2023    6:41 PM 12/01/2023    5:32 PM 12/01/2023    5:00 PM  BP/Weight  Systolic BP 163 177 171 153 156 143 150  Diastolic BP 67 72 73 61 60 59 61  Wt. (Lbs)      152.78   BMI      26.22 kg/m2       Latest Ref Rng & Units 04/16/2022   11:20 AM 07/24/2021    3:42 PM  Foot/eye exam completion dates  Eye Exam No Retinopathy  No Retinopathy      Foot Form Completion  Done      This result is from an external source.

## 2023-12-02 NOTE — Evaluation (Signed)
 Physical Therapy Evaluation Patient Details Name: Kathryn Beck MRN: [id number] DOB: [birth year] Today's Date: 12/02/2023  History of Present Illness  Kathryn Beck  is a 68 y.o. female, with significant history of Atrial fib on apixaban , PAD  (s/p R BKA in 05/2022, prior lithotripsy and stenting of the left superficial femoral and popliteal artery in 06/2022 and ultimately L BKA in 09/2022), HTN, HLD, Type 2 DM, bilateral adrenal adenoma (followed by Endocrinology), chronic HFpEF, aortic stenosis, OSA, ESRD (MWF), history of TIA's, history of recurrent chest pain  (cath in 12/2018 showing normal coronary arteries) recent hospitalization for left hand I&D, MSSA infection on antibiotics IV Ancef  with HD (per MD)   Clinical Impression  Patient demonstrates slow labored movement for moving EOB with HOB elevated likely secondary to core weakness. Patient previously relying on BIL UE for independence with transfers but has since decreased due to LUE wound. Patient also demonstrates significant difficulty with initiating and completing lateral scooting in both directions secondary to the above. Patient nonambulatory at baseline but would anticipate increased difficulty with transfers to w/c based on current functional status. Patient will benefit from continued skilled physical therapy in hospital and recommended venue below to increase strength, balance, endurance for safe ADLs and gait.         If plan is discharge home, recommend the following: Two people to help with walking and/or transfers;Two people to help with bathing/dressing/bathroom;Assistance with cooking/housework;Assistance with feeding;Assist for transportation;Help with stairs or ramp for entrance;Supervision due to cognitive status   Can travel by private vehicle        Equipment Recommendations None recommended by PT  Recommendations for Other Services       Functional Status Assessment Patient has had a recent decline in their  functional status and demonstrates the ability to make significant improvements in function in a reasonable and predictable amount of time.     Precautions / Restrictions Precautions Precautions: Fall Recall of Precautions/Restrictions: Intact Precaution/Restrictions Comments: B BKA Splint/Cast: L hand wrap. Restrictions Weight Bearing Restrictions Per Provider Order: No LUE Weight Bearing Per Provider Order: Non weight bearing (per RN)      Mobility  Bed Mobility Overal bed mobility: Needs Assistance Bed Mobility: Sit to Supine, Sidelying to Sit, Rolling Rolling: Modified independent (Device/Increase time), Used rails Sidelying to sit: Supervision, HOB elevated Supine to sit: Contact guard, HOB elevated Sit to supine: Supervision   General bed mobility comments: labored movement but no physical assist to sit up in bed and go back to supine. Pt did struggle to scoot laterally.    Transfers                   General transfer comment: Non-ambulatory ; uses hoyer lift or sliding board at baseline.    Ambulation/Gait               General Gait Details: Pt non-ambulatory at baseline. Requires assist as of recent via hoyer or total assist from family. Previously independent with slide board transfer prior to hand injury.  Stairs            Wheelchair Mobility     Tilt Bed    Modified Rankin (Stroke Patients Only)       Balance Overall balance assessment: Needs assistance Sitting-balance support: No upper extremity supported, Feet supported Sitting balance-Leahy Scale: Fair Sitting balance - Comments: Fair/good seated EOB with RUE support   Standing balance support:  (N/A)  Pertinent Vitals/Pain Pain Assessment Pain Assessment: 0-10 Pain Score: 8  Pain Location: L hand Pain Descriptors / Indicators: Aching Pain Intervention(s): Monitored during session, Repositioned    Home Living Family/patient  expects to be discharged to:: Private residence Living Arrangements: Spouse/significant other Available Help at Discharge: Family;Available PRN/intermittently Type of Home: House Home Access: Stairs to enter Entrance Stairs-Rails: None Entrance Stairs-Number of Steps: 2 Alternate Level Stairs-Number of Steps: Flight Home Layout: Two level;Able to live on main level with bedroom/bathroom Home Equipment: BSC/3in1;Wheelchair - Forensic psychologist (2 wheels);Cane - single point;Grab bars - tub/shower;Wheelchair - power;Hand held shower head;Other (comment);Hospital bed Additional Comments: per chart and family confirmation    Prior Function Prior Level of Function : Needs assist       Physical Assist : Mobility (physical);ADLs (physical) Mobility (physical): Bed mobility;Transfers ADLs (physical): Bathing;Dressing;IADLs;Toileting Mobility Comments: Pt uses hoyer lift and sliding board for transfers at home to w/c. ADLs Comments: Assist for bathing, dressing, grooming, and IADL's. Pt can reported feed herself and complete basic grooming tasks.     Extremity/Trunk Assessment   Upper Extremity Assessment Upper Extremity Assessment: Defer to OT evaluation RUE Deficits / Details: WFL LUE Deficits / Details: 3-/5 shoulder flexion; near full P/ROM for shoulder flexion; L hand and wrist wrapped due to significant wound. LUE Coordination: decreased fine motor    Lower Extremity Assessment Lower Extremity Assessment: Generalized weakness RLE Deficits / Details: Bilat BKA, no prosthesis, at least 3+ to 4- MMT as pt in able to lift BLE against gravity when getting to EOB. RLE Coordination: decreased fine motor LLE Deficits / Details: Bilat BKA, no prosthesis, at least 3+ to 4- MMT as pt in able to lift BLE against gravity when getting to EOB. LLE Coordination: decreased fine motor    Cervical / Trunk Assessment Cervical / Trunk Assessment: Kyphotic  Communication    Communication Communication: No apparent difficulties    Cognition Arousal: Alert Behavior During Therapy: WFL for tasks assessed/performed   PT - Cognitive impairments: No apparent impairments                         Following commands: Impaired Following commands impaired: Follows one step commands with increased time     Cueing Cueing Techniques: Verbal cues, Tactile cues     General Comments      Exercises     Assessment/Plan    PT Assessment Patient needs continued PT services  PT Problem List Decreased strength;Decreased activity tolerance;Decreased balance;Decreased mobility;Pain;Decreased range of motion       PT Treatment Interventions DME instruction;Functional mobility training;Therapeutic activities;Therapeutic exercise;Balance training;Neuromuscular re-education;Patient/family education;Wheelchair mobility training    PT Goals (Current goals can be found in the Care Plan section)  Acute Rehab PT Goals Patient Stated Goal: return home PT Goal Formulation: With patient/family Time For Goal Achievement: 12/02/23 Potential to Achieve Goals: Good    Frequency Min 2X/week     Co-evaluation PT/OT/SLP Co-Evaluation/Treatment: Yes Reason for Co-Treatment: To address functional/ADL transfers PT goals addressed during session: Mobility/safety with mobility;Balance OT goals addressed during session: ADL's and self-care       AM-PAC PT 6 Clicks Mobility  Outcome Measure Help needed turning from your back to your side while in a flat bed without using bedrails?: A Lot Help needed moving from lying on your back to sitting on the side of a flat bed without using bedrails?: A Lot Help needed moving to and from a bed to a chair (including a wheelchair)?:  Total Help needed standing up from a chair using your arms (e.g., wheelchair or bedside chair)?: Total Help needed to walk in hospital room?: Total Help needed climbing 3-5 steps with a railing? :  Total 6 Click Score: 8    End of Session   Activity Tolerance: Patient tolerated treatment well Patient left: with family/visitor present;in bed;with call bell/phone within reach Nurse Communication: Mobility status PT Visit Diagnosis: Other abnormalities of gait and mobility (R26.89);Muscle weakness (generalized) (M62.81);Pain Pain - Right/Left: Left Pain - part of body: Hand    Time: 0822-0842 PT Time Calculation (min) (ACUTE ONLY): 20 min   Charges:   PT Evaluation $PT Eval Moderate Complexity: 1 Mod PT Treatments $Therapeutic Activity: 8-22 mins PT General Charges $$ ACUTE PT VISIT: 1 Visit         12:33 PM, 12/02/23,  Onnie Como, SPT

## 2023-12-02 NOTE — Consult Note (Signed)
 Gastroenterology Consult   Referring Provider: No ref. provider found Primary Care Physician:  Antonetta Rollene BRAVO, MD Primary Gastroenterologist:  formerly Dr. Harvey  Patient ID: Kathryn Beck; [id number]; [birth year]   Admit date: 12/01/2023  LOS: 1 day   Date of Consultation: 12/02/2023  Reason for Consultation:  drop in Hgb, heme positive stool    History of Present Illness   Kathryn Beck is a 68 y.o. female with past medical history significant for GERD, pancreatitis, moderate aortic stenosis (ECHO 05/2023) recent new onset A-fib on apixaban  with cardioversion 10/27/2023, PAD (s/p R BKA in 05/2022, prior lithotripsy and stenting of the left superficial femoral and popliteal artery in 06/2022 and ultimately L BKA in 09/2022), HTN, HLD, Type 2 DM, bilateral adrenal adenoma (followed by Endocrinology), chronic HFpEF, OSA, ESRD (MWF), history of TIA's, history of recurrent chest pain  (cath in 12/2018 showing normal coronary arteries) recent hospitalization for left hand I&D, MSSA infection on antibiotics IV Ancef , last admission couple of weeks ago with SIRS criteria due to white blood cell count (suspect UTI, hand abscess which was stable) and mental status changes (likely related to hypoglycemia) who presents now from dialysis for low hemoglobin.  ED course: Vital signs stable.  Hemoglobin 6.8 (down from 8.9 two weeks prior), MCV 82.9, platelets 288, creatinine 2.15, BUN 15, Brown stool, stool Hemoccult positive.  Today her creatinine is 2.94, BUN 24, hemoglobin 7.8 after 1 unit of packed red blood cells.  GI consult: Seen November 11, 2023 for inpatient consultation for dark Hemoccult positive stools.  This was in the setting of [name] .  She reports chronic black stools related to her medication.  Hemoglobin typically in the 10 range, did drop down to 8.7 but on repeat hemoglobin it was up to 10 again without a transfusion. Iron 27, B12 and folate normal.  Given patient had no  evidence of active GI bleeding such as overt bleeding, drop in hemoglobin or transfusion requirement, there was no indication for inpatient urgent endoscopy.  Outpatient bidirectional endoscopy recommended for her anemia and continued need for anticoagulation however felt that this could be done as an outpatient and given her multiple comorbidities including moderate aortic stenosis with new onset A-fib it was felt that she would benefit from a center with cardiac anesthesia.  Daughter requested she be referred to GI in [place] upon discharge.     Patient reports some issues with hard stool, infrequent stools. In the past she has used some fiber supplements or other laxatives but had issues with fecal incontinence. Last BM about one week ago per patient. Occasionally has some brbpr. Complains of solid food dysphagia. Some heartburn at times. Some n/v at times. No abdominal pain. Has felt weak lately. No chest pain or SOB.  EGD 06/2017 at [facility]: op note not viewable Path-mild chronic gastritis and no intestinal metaplasia, reactive folveolar hyperplasia, no h.pylori. negative proximal and distal esophageal biopsies.    EGD 11/2016: Dr. Harvey -no source for dysphagia -mild gastritis -bravo placed, omeprazole  does not control acid reflux   Colonoscopy 11/2016: -3 polyps removed from the rectum, tubular adenomas -redundant colon -internal hemorrhoids -next colonoscopy in 3 years       Prior to Admission medications   Medication Sig Start Date End Date Taking? Authorizing Provider  acetaminophen  (TYLENOL ) 500 MG tablet Take 1,000 mg by mouth every 6 (six) hours as needed for mild pain (pain score 1-3).   Yes [provider]  amLODipine  (NORVASC ) 10  MG tablet Take 0.5 tablets (5 mg total) by mouth daily. 10/28/23  Yes Emokpae, Courage, MD  apixaban  (ELIQUIS ) 5 MG TABS tablet Take 1 tablet (5 mg total) by mouth 2 (two) times daily. For Afib 10/29/23  Yes Emokpae, Courage, MD  aspirin  81  MG chewable tablet Chew 2 tablets (162 mg total) by mouth daily. 09/16/23  Yes Bevely Doffing, FNP  B Complex-C-Folic Acid  (RENA-VITE RX) 1 MG TABS Take 1 tablet by mouth daily. 04/15/22  Yes [provider]  busPIRone  (BUSPAR ) 7.5 MG tablet Take 1 tablet (7.5 mg total) by mouth 2 (two) times daily. 11/24/23  Yes Antonetta Rollene BRAVO, MD  calcitRIOL  (ROCALTROL ) 0.5 MCG capsule Take 2 capsules (1 mcg total) by mouth every Monday, Wednesday, and Friday with hemodialysis. 11/05/23  Yes Amin, Ankit C, MD  CEFAZOLIN  SODIUM IV Inject into the vein every Monday, Wednesday, and Friday with hemodialysis. 11/30/23 01/11/24 Yes [provider]  cinacalcet  (SENSIPAR ) 30 MG tablet Take 3 tablets (90 mg total) by mouth 3 (three) times a week. Patient taking differently: Take 90 mg by mouth every Monday, Wednesday, and Friday. 09/09/22  Yes Sheikh, Omair Latif, DO  Darbepoetin Alfa  (ARANESP ) 40 MCG/0.4ML SOSY injection Inject 0.4 mLs (40 mcg total) into the skin every Friday at 6 PM. Patient taking differently: Inject 40 mcg into the vein every Friday at 6 PM. 11/05/23  Yes Amin, Ankit C, MD  ezetimibe  (ZETIA ) 10 MG tablet Take 1 tablet (10 mg total) by mouth daily. 04/27/23  Yes Antonetta Rollene BRAVO, MD  insulin  aspart (NOVOLOG ) 100 UNIT/ML injection Inject 0-6 Units into the skin 3 (three) times daily with meals. CBG 70 - 120: 0 units  CBG 121 - 150: 0 units  CBG 151 - 200: 1 unit  CBG 201-250: 2 units  CBG 251-300: 3 units  CBG 301-350: 4 units  CBG 351-400: 5 units  CBG > 400: Give 10 units and call MD 11/12/23  Yes Shahmehdi, Seyed A, MD  insulin  glargine, 2 Unit Dial , (TOUJEO  MAX SOLOSTAR) 300 UNIT/ML Solostar Pen Inject 30 Units into the skin daily. May need to slowly increase the dose depending upon your blood sugar, follow-up with PCP 01/01/23  Yes Trixie File, MD  metoprolol  succinate (TOPROL -XL) 50 MG 24 hr tablet TAKE (1) TABLET BY MOUTH DAILY WITH FOOD *TAKE AFTER DIALYSIS* Patient taking  differently: Take 50 mg by mouth every Tuesday, Thursday, Saturday, and Sunday. Non-dialysis days 01/05/23  Yes Antonetta Rollene BRAVO, MD  ondansetron  (ZOFRAN ) 4 MG tablet Take 4 mg by mouth every 8 (eight) hours as needed for nausea or vomiting. 11/26/23  Yes [provider]  sertraline  (ZOLOFT ) 50 MG tablet Take one and a half tablets once daily Patient taking differently: Take 75 mg by mouth daily. Take one and a half tablets once daily 11/24/23  Yes Antonetta Rollene BRAVO, MD  torsemide  (DEMADEX ) 100 MG tablet Take 1 tablet (100 mg total) by mouth every Tuesday, Thursday, Saturday, and Sunday. Patient taking differently: Take 100 mg by mouth every Tuesday, Thursday, Saturday, and Sunday. Non-dialysis days 10/28/23  Yes Pearlean Manus, MD  [name]  500 MG chewable tablet Chew 1-2 tablets (500-1,000 mg total) by mouth See admin instructions. Take 1000mg  (2 tablets) by mouth with meals and 500mg  (1 tablet) with snacks 08/23/23  Yes Antonetta Rollene BRAVO, MD  FLUoxetine (PROZAC) 10 MG capsule Take 10 mg by mouth daily.    05/28/11  [provider]  glipiZIDE  (GLUCOTROL ) 10 MG tablet Take 10 mg  by mouth 2 (two) times daily before a meal.    05/28/11  [provider]    Current Facility-Administered Medications  Medication Dose Route Frequency Provider Last Rate Last Admin   acetaminophen  (TYLENOL ) tablet 650 mg  650 mg Oral Q6H PRN Elgergawy, Dawood S, MD   650 mg at 12/02/23 9461   Or   acetaminophen  (TYLENOL ) suppository 650 mg  650 mg Rectal Q6H PRN Elgergawy, Dawood S, MD       [START ON 12/03/2023] ceFAZolin  (ANCEF ) IVPB 2g/100 mL premix  2 g Intravenous Q M,W,F-HD Elgergawy, Dawood S, MD       ezetimibe  (ZETIA ) tablet 10 mg  10 mg Oral Daily Elgergawy, Dawood S, MD   10 mg at 12/02/23 1046   hydrALAZINE  (APRESOLINE ) injection 5 mg  5 mg Intravenous Q4H PRN Elgergawy, Dawood S, MD       insulin  aspart (novoLOG ) injection 0-6 Units  0-6 Units Subcutaneous TID WC Elgergawy, Dawood S, MD        insulin  glargine (LANTUS ) injection 15 Units  15 Units Subcutaneous QHS Lenon Elsie HERO, [facility]   15 Units at 12/01/23 2253   metoprolol  succinate (TOPROL -XL) 24 hr tablet 50 mg  50 mg Oral Q T,Th,S,Su Elgergawy, Dawood S, MD   50 mg at 12/02/23 1046   multivitamin (RENA-VIT) tablet 1 tablet  1 tablet Oral QHS Lenon Elsie HERO, RPH   1 tablet at 12/01/23 2222   pantoprazole  (PROTONIX ) injection 40 mg  40 mg Intravenous Q12H Elgergawy, Dawood S, MD   40 mg at 12/02/23 1046   silver  sulfADIAZINE  (SILVADENE ) 1 % cream   Topical Q12H Segars, Jonathan, MD   Given at 12/02/23 0540   sucroferric oxyhydroxide ([name] ) chewable tablet 1,000 mg  1,000 mg Oral TID WC Lenon Elsie HERO, RPH   1,000 mg at 12/02/23 1102   sucroferric oxyhydroxide ([name] ) chewable tablet 500 mg  500 mg Oral PRN Lenon Elsie HERO, RPH       torsemide  (DEMADEX ) tablet 100 mg  100 mg Oral Q T,Th,S,Su Elgergawy, Dawood S, MD   100 mg at 12/02/23 1046   Facility-Administered Medications Ordered in Other Encounters  Medication Dose Route Frequency Provider Last Rate Last Admin   0.9 %  sodium chloride  infusion   Intravenous Continuous Lockamy, Randi L, NP-C 0 mL/hr at 10/07/22 1422 New Bag at 09/29/23 0813    Allergies as of 12/01/2023 - Review Complete 12/01/2023  Allergen Reaction Noted   Ace inhibitors Anaphylaxis and Swelling 06/07/2007   Penicillins Itching and Swelling 06/07/2007   Statins Other (See Comments) 12/24/2012   Albuterol  Swelling 08/31/2017    Past Medical History:  Diagnosis Date   Acid reflux    Amputated left leg (HCC)    bilateral BKA   Anemia    Arthritis    Axillary masses    Soft tissue - status post excision   Back pain    CHF (congestive heart failure) (HCC)    COVID-19 virus infection 04/06/2019   Depression    End-stage renal disease (HCC)    M/W/F dialysis   Essential hypertension    Headache    years ago   History of blood transfusion    History of cardiac  catheterization    Normal coronary arteries October 2020   History of claustrophobia    History of pneumonia 2019   Hypoxia 04/03/2019   Memory loss    Mixed hyperlipidemia    Obesity    Pancreatitis  Peritoneal dialysis catheter in place [facility])    Pneumonia due to COVID-19 virus 04/02/2019   Sleep apnea    Noncompliant with CPAP   Stroke (HCC)    mini stroke   Type 2 diabetes mellitus (HCC)     Past Surgical History:  Procedure Laterality Date   ABDOMINAL AORTOGRAM W/LOWER EXTREMITY N/A 04/30/2022   Procedure: ABDOMINAL AORTOGRAM W/LOWER EXTREMITY;  Surgeon: Magda Debby SAILOR, MD;  Location: [facility] INVASIVE CV LAB;  Service: Cardiovascular;  Laterality: N/A;   ABDOMINAL AORTOGRAM W/LOWER EXTREMITY N/A 07/21/2022   Procedure: ABDOMINAL AORTOGRAM W/LOWER EXTREMITY;  Surgeon: Serene Gaile ORN, MD;  Location: [facility] INVAS[facility] CV LAB;  Service: Cardiovascular;  Laterality: N/A;   ABDOMINAL HYSTERECTOMY     ACHILLES TENDON LENGTHENING  08/15/2022   Procedure: ACHILLES TENDON LENGTHENING;  Surgeon: Silva Juliene SAUNDERS, DPM;  Location: [facility] OR;  Service: Podiatry;;   AMPUTATION Right 05/29/2022   Procedure: RIGHT BELOW THE KNEE AMPUTATION;  Surgeon: Harden Jerona GAILS, MD;  Location: [facility] OR;  Service: Orthopedics;  Laterality: Right;   AMPUTATION Left 09/04/2022   Procedure: AMPUTATION FOOT, serial irrigation;  Surgeon: Joya Stabs, DPM;  Location: [facility] OR;  Service: Podiatry;  Laterality: Left;  Surgical team to do block   AMPUTATION Left 10/07/2022   Procedure: LEFT BELOW KNEE AMPUTATION;  Surgeon: Harden Jerona GAILS, MD;  Location: [facility] OR;  Service: Orthopedics;  Laterality: Left;   AMPUTATION FINGER Left 09/29/2023   Procedure: AMPUTATION, FINGER;  Surgeon: Harden Jerona GAILS, MD;  Location: [facility] OR;  Service: Orthopedics;  Laterality: Left;  LEFT HAND LONG FINGER RAY AMPUTATION   AV FISTULA PLACEMENT Left 09/02/2017   Procedure: creation of left arm ARTERIOVENOUS (AV) FISTULA;  Surgeon: Serene Gaile ORN, MD;  Location:  [facility] OR;  Service: Vascular;  Laterality: Left;   COLONOSCOPY  2008   Dr. Harvey: normal    COLONOSCOPY N/A 12/18/2016   Dr. Harvey: multiple tubular adenomas, internal hemorrhoids. Surveillance in 3 years    ESOPHAGEAL DILATION N/A 10/13/2015   Procedure: ESOPHAGEAL DILATION;  Surgeon: Claudis RAYMOND Rivet, MD;  Location: [facility];  Service: Endoscopy;  Laterality: N/A;   ESOPHAGOGASTRODUODENOSCOPY N/A 10/13/2015   Dr. Rivet: chronic gastritis on path, no H.pylori. Empiric dilation    ESOPHAGOGASTRODUODENOSCOPY N/A 12/18/2016   Dr. Harvey: mild gastritis. BRAVO study revealed uncontrolled GERD. Dysphagia secondary to uncontrolled reflux   FOOT SURGERY Bilateral    nerve     INCISION AND DRAINAGE OF WOUND Left 10/30/2023   Procedure: IRRIGATION AND DEBRIDEMENT WOUND;  Surgeon: Arlinda Buster, MD;  Location: [facility] OR;  Service: Orthopedics;  Laterality: Left;  LEFT HAND WOUND   LEFT HEART CATH AND CORONARY ANGIOGRAPHY N/A 12/29/2018   Procedure: LEFT HEART CATH AND CORONARY ANGIOGRAPHY;  Surgeon: Dann Candyce RAMAN, MD;  Location: [facility] INVASIVE CV LAB;  Service: Cardiovascular;  Laterality: N/A;   LOWER EXTREMITY ANGIOGRAPHY Right 05/04/2022   Procedure: Lower Extremity Angiography;  Surgeon: Lanis Fonda BRAVO, MD;  Location: [facility] INVASIVE CV LAB;  Service: Cardiovascular;  Laterality: Right;   LUNG BIOPSY     MASS EXCISION Right 01/09/2013   Procedure: EXCISION OF NEOPLASM OF RIGHT  AXILLA  AND EXCISION OF NEOPLASM OF LEFT AXILLA;  Surgeon: Oneil DELENA Budge, MD;  Location: [facility] ORS;  Service: General;  Laterality: Right;  procedure end @ 08:23   MYRINGOTOMY WITH TUBE PLACEMENT Bilateral 04/28/2017   Procedure: BILATERAL MYRINGOTOMY WITH TUBE PLACEMENT;  Surgeon: Karis Clunes, MD;  Location: [facility] OR;  Service: ENT;  Laterality:  Bilateral;   PERIPHERAL VASCULAR BALLOON ANGIOPLASTY Right 05/04/2022   Procedure: PERIPHERAL VASCULAR BALLOON ANGIOPLASTY;  Surgeon: Lanis Fonda BRAVO, MD;  Location: [facility] INVASIVE CV LAB;   Service: Cardiovascular;  Laterality: Right;  PT   PERIPHERAL VASCULAR INTERVENTION Right 05/04/2022   Procedure: PERIPHERAL VASCULAR INTERVENTION;  Surgeon: Lanis Fonda BRAVO, MD;  Location: [facility] INVASIVE CV LAB;  Service: Cardiovascular;  Laterality: Right;  SFA   PERIPHERAL VASCULAR INTERVENTION Left 07/21/2022   Procedure: PERIPHERAL VASCULAR INTERVENTION;  Surgeon: Serene Gaile ORN, MD;  [facility];  Service: Cardiovascular;  Laterality: Left;   REVISION OF ARTERIOVENOUS GORETEX GRAFT Left 05/04/2018   Procedure: TRANSPOSITION OF CEPHALIC VEIN ARTERIOVENOUS FISTULA LEFT ARM;  Surgeon: Oris Krystal FALCON, MD;  Location: [facility] OR;  Service: Vascular;  Laterality: Left;   SAVORY DILATION N/A 12/18/2016   Procedure: SAVORY DILATION;  Surgeon: Harvey Margo CROME, MD;  [facility];  Service: Endoscopy;  Laterality: N/A;   TRANSMETATARSAL AMPUTATION Left 08/15/2022   Procedure: TRANSMETATARSAL AMPUTATION;  Surgeon: Silva Juliene SAUNDERS, DPM;  Location: [facility] OR;  Service: Podiatry;  Laterality: Left;    Family History  Problem Relation Age of Onset   Hypertension Father    Hypercholesterolemia Father    Arthritis Father    Hypertension Sister    Hypercholesterolemia Sister    Breast cancer Sister    Hypertension Sister    Colon cancer Neg Hx    Colon polyps Neg Hx     Social History   Socioeconomic History   Marital status: Married    Spouse name: Saffron Busey   Number of children: 2   Years of education: 12   Highest education level: Some college, no degree  Occupational History   Occupation: retired   Tobacco Use   Smoking status: Never    Passive exposure: Never   Smokeless tobacco: Never   Tobacco comments:    Verified by Daughter, [name]  Vaping Use   Vaping status: Never Used  Substance and Sexual Activity   Alcohol use: No   Drug use: No   Sexual activity: Not Currently    Partners: Male  Other Topics Concern   Not on file  Social History  Narrative   Lives alone with husband    Right handed   Caffeine-1/2 daily   Social Drivers of Health   Financial Resource Strain: Low Risk  (11/24/2023)   Overall Financial Resource Strain (CARDIA)    Difficulty of Paying Living Expenses: Not hard at all  Food Insecurity: No Food Insecurity (12/01/2023)   Hunger Vital Sign    Worried About Running Out of Food in the Last Year: Never true    Ran Out of Food in the Last Year: Never true  Transportation Needs: No Transportation Needs (12/01/2023)   PRAPARE - Administrator, Civil Service (Medical): No    Lack of Transportation (Non-Medical): No  Physical Activity: Inactive (11/24/2023)   Exercise Vital Sign    Days of Exercise per Week: 0 days    Minutes of Exercise per Session: Not on file  Stress: Stress Concern Present (11/24/2023)   [employer] of Occupational Health - Occupational Stress Questionnaire    Feeling of Stress: To some extent  Social Connections: Moderately Integrated (12/01/2023)   Social Connection and Isolation Panel    Frequency of Communication with Friends and Family: Three times a week    Frequency of Social Gatherings with Friends and Family: Three times a week  Attends Religious Services: 1 to 4 times per year    Active Member of Clubs or Organizations: No    Attends Banker Meetings: Never    Marital Status: Married  Recent Concern: Social Connections - Moderately Isolated (11/25/2023)   Social Connection and Isolation Panel    Frequency of Communication with Friends and Family: Three times a week    Frequency of Social Gatherings with Friends and Family: Three times a week    Attends Religious Services: Patient declined    Active Member of Clubs or Organizations: No    Attends Banker Meetings: Never    Marital Status: Married  Catering manager Violence: Not At Risk (12/01/2023)   Humiliation, Afraid, Rape, and Kick questionnaire    Fear of Current or Ex-Partner: No     Emotionally Abused: No    Physically Abused: No    Sexually Abused: No     Review of System:   General: Negative for anorexia, weight loss, fever, chills, fatigue, +weakness. Eyes: Negative for vision changes.  ENT: Negative for hoarseness, difficulty swallowing , nasal congestion. CV: Negative for chest pain, angina, palpitations, dyspnea on exertion, peripheral edema.  Respiratory: Negative for dyspnea at rest, dyspnea on exertion, cough, sputum, wheezing.  GI: See history of present illness. GU:  Negative for dysuria, hematuria, urinary incontinence, urinary frequency, nocturnal urination.  MS: Negative for joint pain, low back pain.  Derm: Negative for rash or itching.  Neuro: Negative for weakness, abnormal sensation, seizure, frequent headaches, memory loss, confusion.  Psych: Negative for anxiety, depression, suicidal ideation, hallucinations.  Endo: Negative for unusual weight change.  Heme: Negative for bruising or bleeding. Allergy: Negative for rash or hives.      Physical Examination:   Vital signs in last 24 hours: Temp:  [97.6 F (36.4 C)-98.5 F (36.9 C)] 97.9 F (36.6 C) (09/11 0447) Pulse Rate:  [63-75] 69 (09/11 0447) Resp:  [11-20] 20 (09/11 0447) BP: (132-177)/(54-73) 163/67 (09/11 0447) SpO2:  [93 %-100 %] 100 % (09/11 0447) Weight:  [66.7 kg-69.3 kg] 69.3 kg (09/10 1732) Last BM Date : 12/01/23  General: Well-nourished, well-developed in no acute distress.  Head: Normocephalic, atraumatic.   Eyes: Conjunctiva pink, no icterus. Mouth: Oropharyngeal mucosa moist and pink , no lesions erythema or exudate. Neck: Supple without thyromegaly, masses, or lymphadenopathy.  Lungs: Clear to auscultation bilaterally.  Heart: Regular rate and rhythm, 3/6 SEM. No rubs or gallops.  Abdomen: Bowel sounds are normal, nontender, nondistended, no hepatosplenomegaly or masses, no abdominal bruits or hernia , no rebound or guarding.   Rectal: not  performed Extremities: bilateral BKA.  Neuro: Alert and oriented x 4 , grossly normal neurologically.  Skin: Warm and dry, no rash or jaundice.   Psych: Alert and cooperative, normal mood and affect.        Intake/Output from previous day: 09/10 0701 - 09/11 0700 In: 440 [Blood:440] Out: -  Intake/Output this shift: Total I/O In: 480 [P.O.:480] Out: -   Lab Results:   CBC Recent Labs    12/01/23 1408 12/02/23 0505  WBC 7.1 7.1  HGB 6.8* 7.8*  HCT 22.8* 26.3*  MCV 82.9 85.7  PLT 288 264   BMET Recent Labs    12/01/23 1408 12/02/23 0505  NA 141 139  K 3.9 4.6  CL 99 101  CO2 28 27  GLUCOSE 209* 138*  BUN 15 24*  CREATININE 2.15* 2.94*  CALCIUM  8.6* 7.9*   LFT Recent Labs  12/01/23 1408  BILITOT 0.4  ALKPHOS 81  AST 18  ALT 8  PROT 6.4*  ALBUMIN  3.0*    Lipase No results for input(s): LIPASE in the last 72 hours.  PT/INR No results for input(s): LABPROT, INR in the last 72 hours.   Hepatitis Panel No results for input(s): HEPBSAG, HCVAB, HEPAIGM, HEPBIGM in the last 72 hours.   Imaging Studies:   CT Head Wo Contrast Result Date: 11/10/2023 EXAM: CT HEAD WITHOUT CONTRAST 11/10/2023 10:52:18 AM TECHNIQUE: CT of the head was performed without the administration of intravenous contrast. Automated exposure control, iterative reconstruction, and/or weight based adjustment of the mA/kV was utilized to reduce the radiation dose to as low as reasonably achievable. COMPARISON: MRI of the head dated 12/17/2022. CLINICAL HISTORY: Mental status change, unknown cause. Pt arrived via [facility] from dialysis, from home, pt was A\T\O this morning 0645 am before Tx and become lethargic and zoned out around 0800 this morning and was slow to respond, pt states she feels a little sleepy but able to answer EMSs questions. FINDINGS: BRAIN AND VENTRICLES: No acute hemorrhage. Gray-white differentiation is preserved. No hydrocephalus. No extra-axial collection.  No mass effect or midline shift. Chronic encephalomalacia changes present within the left parietal and occipital lobes. Mild periventricular white matter disease. ORBITS: No acute abnormality. SINUSES: No acute abnormality. SOFT TISSUES AND SKULL: No acute soft tissue abnormality. No skull fracture. Calcifications within the carotid siphons. IMPRESSION: 1. No acute intracranial abnormality. 2. Chronic encephalomalacia changes in the left parietal and occipital lobes. 3. Mild periventricular white matter disease. 4. Calcifications within the carotid siphons. Electronically signed by: Evalene Coho MD 11/10/2023 10:58 AM EDT RP Workstation: [id number]   DG Chest Portable 1 View Result Date: 11/10/2023 CLINICAL DATA:  Confusion.  Lethargy. EXAM: PORTABLE CHEST 1 VIEW COMPARISON:  10/27/2023. FINDINGS: Stable cardiomegaly. Aortic atherosclerosis. Similar streaky left basilar opacities, favored to reflect atelectasis. The right lung is clear. No sizable pleural effusion or pneumothorax. Degenerative changes of the right glenohumeral joint. No acute osseous abnormality. IMPRESSION: 1. Similar streaky left basilar opacities, favored to reflect atelectasis. 2. Stable cardiomegaly. Electronically Signed   By: Harrietta Sherry M.D.   On: 11/10/2023 10:10  [4 week]  Assessment:   68 y/o female with past medical history significant for GERD, pancreatitis, moderate aortic stenosis (ECHO 05/2023) recent new onset A-fib on apixaban  with cardioversion 10/27/2023, PAD (s/p R BKA in 05/2022, prior lithotripsy and stenting of the left superficial femoral and popliteal artery in 06/2022 and ultimately L BKA in 09/2022), HTN, HLD, Type 2 DM, bilateral adrenal adenoma (followed by Endocrinology), chronic HFpEF, OSA, ESRD (MWF), history of TIA's, history of recurrent chest pain  (cath in 12/2018 showing normal coronary arteries) recent hospitalization for left hand I&D, MSSA infection on antibiotics IV Ancef , last admission couple  of weeks ago with SIRS criteria due to white blood cell count (suspect UTI, hand abscess which was stable) and mental status changes (likely related to hypoglycemia) who presents now from dialysis for low hemoglobin.  Acute on chronic anemia/Hemoccult positive stool on Eliquis : -she reports chronic melena due to medication. Occasional brbpr in setting of constipation -last EGD 2019 -last colonoscopy 2018, was due in 2021 for surveillance (adenomatous colon polyps) -presenting with acute decline in Hgb from 8.9 to 6.8 in two weeks -last dose of Eliquis  was yesterday AM. -she would benefit from colonoscopy/EGD  Solid food dysphagia: -consider esophageal dilation at time of EGD  Constipation: -patient concerned about potential fecal incontinence or urgency  while on dialysis -consider miralax  one capful daily, titrate as needed  Plan:   Trend Hgb, transfuse as needed. Miralax  one capful daily. Continue clear liquids for now. IV PPI BID Will need to discuss moderate aortic stenosis with anesthesia, previously recommended procedures in [facility] with cardiac anesthesia.    LOS: 1 day   We would like to thank you for the opportunity to participate in the care of Kathryn Beck.  Sonny RAMAN. Ezzard [facility]IGG[facility] [phone] 9/11/202511:41 AM

## 2023-12-02 NOTE — H&P (View-Only) (Signed)
 Gastroenterology Consult   Referring Provider: No ref. provider found Primary Care Physician:  Antonetta Rollene BRAVO, MD Primary Gastroenterologist:  formerly Dr. Harvey  Patient ID: Kathryn Beck; [id number]; [birth year]   Admit date: 12/01/2023  LOS: 1 day   Date of Consultation: 12/02/2023  Reason for Consultation:  drop in Hgb, heme positive stool    History of Present Illness   Kathryn Beck is a 68 y.o. female with past medical history significant for GERD, pancreatitis, moderate aortic stenosis (ECHO 05/2023) recent new onset A-fib on apixaban  with cardioversion 10/27/2023, PAD (s/p R BKA in 05/2022, prior lithotripsy and stenting of the left superficial femoral and popliteal artery in 06/2022 and ultimately L BKA in 09/2022), HTN, HLD, Type 2 DM, bilateral adrenal adenoma (followed by Endocrinology), chronic HFpEF, OSA, ESRD (MWF), history of TIA's, history of recurrent chest pain  (cath in 12/2018 showing normal coronary arteries) recent hospitalization for left hand I&D, MSSA infection on antibiotics IV Ancef , last admission couple of weeks ago with SIRS criteria due to white blood cell count (suspect UTI, hand abscess which was stable) and mental status changes (likely related to hypoglycemia) who presents now from dialysis for low hemoglobin.  ED course: Vital signs stable.  Hemoglobin 6.8 (down from 8.9 two weeks prior), MCV 82.9, platelets 288, creatinine 2.15, BUN 15, Brown stool, stool Hemoccult positive.  Today her creatinine is 2.94, BUN 24, hemoglobin 7.8 after 1 unit of packed red blood cells.  GI consult: Seen November 11, 2023 for inpatient consultation for dark Hemoccult positive stools.  This was in the setting of [name] .  She reports chronic black stools related to her medication.  Hemoglobin typically in the 10 range, did drop down to 8.7 but on repeat hemoglobin it was up to 10 again without a transfusion. Iron 27, B12 and folate normal.  Given patient had no  evidence of active GI bleeding such as overt bleeding, drop in hemoglobin or transfusion requirement, there was no indication for inpatient urgent endoscopy.  Outpatient bidirectional endoscopy recommended for her anemia and continued need for anticoagulation however felt that this could be done as an outpatient and given her multiple comorbidities including moderate aortic stenosis with new onset A-fib it was felt that she would benefit from a center with cardiac anesthesia.  Daughter requested she be referred to GI in [place] upon discharge.     Patient reports some issues with hard stool, infrequent stools. In the past she has used some fiber supplements or other laxatives but had issues with fecal incontinence. Last BM about one week ago per patient. Occasionally has some brbpr. Complains of solid food dysphagia. Some heartburn at times. Some n/v at times. No abdominal pain. Has felt weak lately. No chest pain or SOB.  EGD 06/2017 at [facility]: op note not viewable Path-mild chronic gastritis and no intestinal metaplasia, reactive folveolar hyperplasia, no h.pylori. negative proximal and distal esophageal biopsies.    EGD 11/2016: Dr. Harvey -no source for dysphagia -mild gastritis -bravo placed, omeprazole  does not control acid reflux   Colonoscopy 11/2016: -3 polyps removed from the rectum, tubular adenomas -redundant colon -internal hemorrhoids -next colonoscopy in 3 years       Prior to Admission medications   Medication Sig Start Date End Date Taking? Authorizing Provider  acetaminophen  (TYLENOL ) 500 MG tablet Take 1,000 mg by mouth every 6 (six) hours as needed for mild pain (pain score 1-3).   Yes [provider]  amLODipine  (NORVASC ) 10  MG tablet Take 0.5 tablets (5 mg total) by mouth daily. 10/28/23  Yes Emokpae, Courage, MD  apixaban  (ELIQUIS ) 5 MG TABS tablet Take 1 tablet (5 mg total) by mouth 2 (two) times daily. For Afib 10/29/23  Yes Emokpae, Courage, MD  aspirin  81  MG chewable tablet Chew 2 tablets (162 mg total) by mouth daily. 09/16/23  Yes Bevely Doffing, FNP  B Complex-C-Folic Acid  (RENA-VITE RX) 1 MG TABS Take 1 tablet by mouth daily. 04/15/22  Yes [provider]  busPIRone  (BUSPAR ) 7.5 MG tablet Take 1 tablet (7.5 mg total) by mouth 2 (two) times daily. 11/24/23  Yes Antonetta Rollene BRAVO, MD  calcitRIOL  (ROCALTROL ) 0.5 MCG capsule Take 2 capsules (1 mcg total) by mouth every Monday, Wednesday, and Friday with hemodialysis. 11/05/23  Yes Amin, Ankit C, MD  CEFAZOLIN  SODIUM IV Inject into the vein every Monday, Wednesday, and Friday with hemodialysis. 11/30/23 01/11/24 Yes [provider]  cinacalcet  (SENSIPAR ) 30 MG tablet Take 3 tablets (90 mg total) by mouth 3 (three) times a week. Patient taking differently: Take 90 mg by mouth every Monday, Wednesday, and Friday. 09/09/22  Yes Sheikh, Omair Latif, DO  Darbepoetin Alfa  (ARANESP ) 40 MCG/0.4ML SOSY injection Inject 0.4 mLs (40 mcg total) into the skin every Friday at 6 PM. Patient taking differently: Inject 40 mcg into the vein every Friday at 6 PM. 11/05/23  Yes Amin, Ankit C, MD  ezetimibe  (ZETIA ) 10 MG tablet Take 1 tablet (10 mg total) by mouth daily. 04/27/23  Yes Antonetta Rollene BRAVO, MD  insulin  aspart (NOVOLOG ) 100 UNIT/ML injection Inject 0-6 Units into the skin 3 (three) times daily with meals. CBG 70 - 120: 0 units  CBG 121 - 150: 0 units  CBG 151 - 200: 1 unit  CBG 201-250: 2 units  CBG 251-300: 3 units  CBG 301-350: 4 units  CBG 351-400: 5 units  CBG > 400: Give 10 units and call MD 11/12/23  Yes Shahmehdi, Seyed A, MD  insulin  glargine, 2 Unit Dial , (TOUJEO  MAX SOLOSTAR) 300 UNIT/ML Solostar Pen Inject 30 Units into the skin daily. May need to slowly increase the dose depending upon your blood sugar, follow-up with PCP 01/01/23  Yes Trixie File, MD  metoprolol  succinate (TOPROL -XL) 50 MG 24 hr tablet TAKE (1) TABLET BY MOUTH DAILY WITH FOOD *TAKE AFTER DIALYSIS* Patient taking  differently: Take 50 mg by mouth every Tuesday, Thursday, Saturday, and Sunday. Non-dialysis days 01/05/23  Yes Antonetta Rollene BRAVO, MD  ondansetron  (ZOFRAN ) 4 MG tablet Take 4 mg by mouth every 8 (eight) hours as needed for nausea or vomiting. 11/26/23  Yes [provider]  sertraline  (ZOLOFT ) 50 MG tablet Take one and a half tablets once daily Patient taking differently: Take 75 mg by mouth daily. Take one and a half tablets once daily 11/24/23  Yes Antonetta Rollene BRAVO, MD  torsemide  (DEMADEX ) 100 MG tablet Take 1 tablet (100 mg total) by mouth every Tuesday, Thursday, Saturday, and Sunday. Patient taking differently: Take 100 mg by mouth every Tuesday, Thursday, Saturday, and Sunday. Non-dialysis days 10/28/23  Yes Pearlean Manus, MD  [name]  500 MG chewable tablet Chew 1-2 tablets (500-1,000 mg total) by mouth See admin instructions. Take 1000mg  (2 tablets) by mouth with meals and 500mg  (1 tablet) with snacks 08/23/23  Yes Antonetta Rollene BRAVO, MD  FLUoxetine (PROZAC) 10 MG capsule Take 10 mg by mouth daily.    05/28/11  [provider]  glipiZIDE  (GLUCOTROL ) 10 MG tablet Take 10 mg  by mouth 2 (two) times daily before a meal.    05/28/11  [provider]    Current Facility-Administered Medications  Medication Dose Route Frequency Provider Last Rate Last Admin   acetaminophen  (TYLENOL ) tablet 650 mg  650 mg Oral Q6H PRN Elgergawy, Dawood S, MD   650 mg at 12/02/23 9461   Or   acetaminophen  (TYLENOL ) suppository 650 mg  650 mg Rectal Q6H PRN Elgergawy, Dawood S, MD       [START ON 12/03/2023] ceFAZolin  (ANCEF ) IVPB 2g/100 mL premix  2 g Intravenous Q M,W,F-HD Elgergawy, Dawood S, MD       ezetimibe  (ZETIA ) tablet 10 mg  10 mg Oral Daily Elgergawy, Dawood S, MD   10 mg at 12/02/23 1046   hydrALAZINE  (APRESOLINE ) injection 5 mg  5 mg Intravenous Q4H PRN Elgergawy, Dawood S, MD       insulin  aspart (novoLOG ) injection 0-6 Units  0-6 Units Subcutaneous TID WC Elgergawy, Dawood S, MD        insulin  glargine (LANTUS ) injection 15 Units  15 Units Subcutaneous QHS Lenon Elsie HERO, [facility]   15 Units at 12/01/23 2253   metoprolol  succinate (TOPROL -XL) 24 hr tablet 50 mg  50 mg Oral Q T,Th,S,Su Elgergawy, Dawood S, MD   50 mg at 12/02/23 1046   multivitamin (RENA-VIT) tablet 1 tablet  1 tablet Oral QHS Lenon Elsie HERO, RPH   1 tablet at 12/01/23 2222   pantoprazole  (PROTONIX ) injection 40 mg  40 mg Intravenous Q12H Elgergawy, Dawood S, MD   40 mg at 12/02/23 1046   silver  sulfADIAZINE  (SILVADENE ) 1 % cream   Topical Q12H Segars, Jonathan, MD   Given at 12/02/23 0540   sucroferric oxyhydroxide ([name] ) chewable tablet 1,000 mg  1,000 mg Oral TID WC Lenon Elsie HERO, RPH   1,000 mg at 12/02/23 1102   sucroferric oxyhydroxide ([name] ) chewable tablet 500 mg  500 mg Oral PRN Lenon Elsie HERO, RPH       torsemide  (DEMADEX ) tablet 100 mg  100 mg Oral Q T,Th,S,Su Elgergawy, Dawood S, MD   100 mg at 12/02/23 1046   Facility-Administered Medications Ordered in Other Encounters  Medication Dose Route Frequency Provider Last Rate Last Admin   0.9 %  sodium chloride  infusion   Intravenous Continuous Lockamy, Randi L, NP-C 0 mL/hr at 10/07/22 1422 New Bag at 09/29/23 0813    Allergies as of 12/01/2023 - Review Complete 12/01/2023  Allergen Reaction Noted   Ace inhibitors Anaphylaxis and Swelling 06/07/2007   Penicillins Itching and Swelling 06/07/2007   Statins Other (See Comments) 12/24/2012   Albuterol  Swelling 08/31/2017    Past Medical History:  Diagnosis Date   Acid reflux    Amputated left leg (HCC)    bilateral BKA   Anemia    Arthritis    Axillary masses    Soft tissue - status post excision   Back pain    CHF (congestive heart failure) (HCC)    COVID-19 virus infection 04/06/2019   Depression    End-stage renal disease (HCC)    M/W/F dialysis   Essential hypertension    Headache    years ago   History of blood transfusion    History of cardiac  catheterization    Normal coronary arteries October 2020   History of claustrophobia    History of pneumonia 2019   Hypoxia 04/03/2019   Memory loss    Mixed hyperlipidemia    Obesity    Pancreatitis  Peritoneal dialysis catheter in place [facility])    Pneumonia due to COVID-19 virus 04/02/2019   Sleep apnea    Noncompliant with CPAP   Stroke (HCC)    mini stroke   Type 2 diabetes mellitus (HCC)     Past Surgical History:  Procedure Laterality Date   ABDOMINAL AORTOGRAM W/LOWER EXTREMITY N/A 04/30/2022   Procedure: ABDOMINAL AORTOGRAM W/LOWER EXTREMITY;  Surgeon: Magda Debby SAILOR, MD;  Location: [facility] INVASIVE CV LAB;  Service: Cardiovascular;  Laterality: N/A;   ABDOMINAL AORTOGRAM W/LOWER EXTREMITY N/A 07/21/2022   Procedure: ABDOMINAL AORTOGRAM W/LOWER EXTREMITY;  Surgeon: Serene Gaile ORN, MD;  Location: [facility] INVAS[facility] CV LAB;  Service: Cardiovascular;  Laterality: N/A;   ABDOMINAL HYSTERECTOMY     ACHILLES TENDON LENGTHENING  08/15/2022   Procedure: ACHILLES TENDON LENGTHENING;  Surgeon: Silva Juliene SAUNDERS, DPM;  Location: [facility] OR;  Service: Podiatry;;   AMPUTATION Right 05/29/2022   Procedure: RIGHT BELOW THE KNEE AMPUTATION;  Surgeon: Harden Jerona GAILS, MD;  Location: [facility] OR;  Service: Orthopedics;  Laterality: Right;   AMPUTATION Left 09/04/2022   Procedure: AMPUTATION FOOT, serial irrigation;  Surgeon: Joya Stabs, DPM;  Location: [facility] OR;  Service: Podiatry;  Laterality: Left;  Surgical team to do block   AMPUTATION Left 10/07/2022   Procedure: LEFT BELOW KNEE AMPUTATION;  Surgeon: Harden Jerona GAILS, MD;  Location: [facility] OR;  Service: Orthopedics;  Laterality: Left;   AMPUTATION FINGER Left 09/29/2023   Procedure: AMPUTATION, FINGER;  Surgeon: Harden Jerona GAILS, MD;  Location: [facility] OR;  Service: Orthopedics;  Laterality: Left;  LEFT HAND LONG FINGER RAY AMPUTATION   AV FISTULA PLACEMENT Left 09/02/2017   Procedure: creation of left arm ARTERIOVENOUS (AV) FISTULA;  Surgeon: Serene Gaile ORN, MD;  Location:  [facility] OR;  Service: Vascular;  Laterality: Left;   COLONOSCOPY  2008   Dr. Harvey: normal    COLONOSCOPY N/A 12/18/2016   Dr. Harvey: multiple tubular adenomas, internal hemorrhoids. Surveillance in 3 years    ESOPHAGEAL DILATION N/A 10/13/2015   Procedure: ESOPHAGEAL DILATION;  Surgeon: Claudis RAYMOND Rivet, MD;  Location: [facility];  Service: Endoscopy;  Laterality: N/A;   ESOPHAGOGASTRODUODENOSCOPY N/A 10/13/2015   Dr. Rivet: chronic gastritis on path, no H.pylori. Empiric dilation    ESOPHAGOGASTRODUODENOSCOPY N/A 12/18/2016   Dr. Harvey: mild gastritis. BRAVO study revealed uncontrolled GERD. Dysphagia secondary to uncontrolled reflux   FOOT SURGERY Bilateral    nerve     INCISION AND DRAINAGE OF WOUND Left 10/30/2023   Procedure: IRRIGATION AND DEBRIDEMENT WOUND;  Surgeon: Arlinda Buster, MD;  Location: [facility] OR;  Service: Orthopedics;  Laterality: Left;  LEFT HAND WOUND   LEFT HEART CATH AND CORONARY ANGIOGRAPHY N/A 12/29/2018   Procedure: LEFT HEART CATH AND CORONARY ANGIOGRAPHY;  Surgeon: Dann Candyce RAMAN, MD;  Location: [facility] INVASIVE CV LAB;  Service: Cardiovascular;  Laterality: N/A;   LOWER EXTREMITY ANGIOGRAPHY Right 05/04/2022   Procedure: Lower Extremity Angiography;  Surgeon: Lanis Fonda BRAVO, MD;  Location: [facility] INVASIVE CV LAB;  Service: Cardiovascular;  Laterality: Right;   LUNG BIOPSY     MASS EXCISION Right 01/09/2013   Procedure: EXCISION OF NEOPLASM OF RIGHT  AXILLA  AND EXCISION OF NEOPLASM OF LEFT AXILLA;  Surgeon: Oneil DELENA Budge, MD;  Location: [facility] ORS;  Service: General;  Laterality: Right;  procedure end @ 08:23   MYRINGOTOMY WITH TUBE PLACEMENT Bilateral 04/28/2017   Procedure: BILATERAL MYRINGOTOMY WITH TUBE PLACEMENT;  Surgeon: Karis Clunes, MD;  Location: [facility] OR;  Service: ENT;  Laterality:  Bilateral;   PERIPHERAL VASCULAR BALLOON ANGIOPLASTY Right 05/04/2022   Procedure: PERIPHERAL VASCULAR BALLOON ANGIOPLASTY;  Surgeon: Lanis Fonda BRAVO, MD;  Location: [facility] INVASIVE CV LAB;   Service: Cardiovascular;  Laterality: Right;  PT   PERIPHERAL VASCULAR INTERVENTION Right 05/04/2022   Procedure: PERIPHERAL VASCULAR INTERVENTION;  Surgeon: Lanis Fonda BRAVO, MD;  Location: [facility] INVASIVE CV LAB;  Service: Cardiovascular;  Laterality: Right;  SFA   PERIPHERAL VASCULAR INTERVENTION Left 07/21/2022   Procedure: PERIPHERAL VASCULAR INTERVENTION;  Surgeon: Serene Gaile ORN, MD;  [facility];  Service: Cardiovascular;  Laterality: Left;   REVISION OF ARTERIOVENOUS GORETEX GRAFT Left 05/04/2018   Procedure: TRANSPOSITION OF CEPHALIC VEIN ARTERIOVENOUS FISTULA LEFT ARM;  Surgeon: Oris Krystal FALCON, MD;  Location: [facility] OR;  Service: Vascular;  Laterality: Left;   SAVORY DILATION N/A 12/18/2016   Procedure: SAVORY DILATION;  Surgeon: Harvey Margo CROME, MD;  Location: [facility];  Service: Endoscopy;  Laterality: N/A;   TRANSMETATARSAL AMPUTATION Left 08/15/2022   Procedure: TRANSMETATARSAL AMPUTATION;  Surgeon: Silva Juliene SAUNDERS, DPM;  Location: [facility] OR;  Service: Podiatry;  Laterality: Left;    Family History  Problem Relation Age of Onset   Hypertension Father    Hypercholesterolemia Father    Arthritis Father    Hypertension Sister    Hypercholesterolemia Sister    Breast cancer Sister    Hypertension Sister    Colon cancer Neg Hx    Colon polyps Neg Hx     Social History   Socioeconomic History   Marital status: Married    Spouse name: Saffron Busey   Number of children: 2   Years of education: 12   Highest education level: Some college, no degree  Occupational History   Occupation: retired   Tobacco Use   Smoking status: Never    Passive exposure: Never   Smokeless tobacco: Never   Tobacco comments:    Verified by Daughter, [name]  Vaping Use   Vaping status: Never Used  Substance and Sexual Activity   Alcohol use: No   Drug use: No   Sexual activity: Not Currently    Partners: Male  Other Topics Concern   Not on file  Social History  Narrative   Lives alone with husband    Right handed   Caffeine-1/2 daily   Social Drivers of Health   Financial Resource Strain: Low Risk  (11/24/2023)   Overall Financial Resource Strain (CARDIA)    Difficulty of Paying Living Expenses: Not hard at all  Food Insecurity: No Food Insecurity (12/01/2023)   Hunger Vital Sign    Worried About Running Out of Food in the Last Year: Never true    Ran Out of Food in the Last Year: Never true  Transportation Needs: No Transportation Needs (12/01/2023)   PRAPARE - Administrator, Civil Service (Medical): No    Lack of Transportation (Non-Medical): No  Physical Activity: Inactive (11/24/2023)   Exercise Vital Sign    Days of Exercise per Week: 0 days    Minutes of Exercise per Session: Not on file  Stress: Stress Concern Present (11/24/2023)   [employer] of Occupational Health - Occupational Stress Questionnaire    Feeling of Stress: To some extent  Social Connections: Moderately Integrated (12/01/2023)   Social Connection and Isolation Panel    Frequency of Communication with Friends and Family: Three times a week    Frequency of Social Gatherings with Friends and Family: Three times a week  Attends Religious Services: 1 to 4 times per year    Active Member of Clubs or Organizations: No    Attends Banker Meetings: Never    Marital Status: Married  Recent Concern: Social Connections - Moderately Isolated (11/25/2023)   Social Connection and Isolation Panel    Frequency of Communication with Friends and Family: Three times a week    Frequency of Social Gatherings with Friends and Family: Three times a week    Attends Religious Services: Patient declined    Active Member of Clubs or Organizations: No    Attends Banker Meetings: Never    Marital Status: Married  Catering manager Violence: Not At Risk (12/01/2023)   Humiliation, Afraid, Rape, and Kick questionnaire    Fear of Current or Ex-Partner: No     Emotionally Abused: No    Physically Abused: No    Sexually Abused: No     Review of System:   General: Negative for anorexia, weight loss, fever, chills, fatigue, +weakness. Eyes: Negative for vision changes.  ENT: Negative for hoarseness, difficulty swallowing , nasal congestion. CV: Negative for chest pain, angina, palpitations, dyspnea on exertion, peripheral edema.  Respiratory: Negative for dyspnea at rest, dyspnea on exertion, cough, sputum, wheezing.  GI: See history of present illness. GU:  Negative for dysuria, hematuria, urinary incontinence, urinary frequency, nocturnal urination.  MS: Negative for joint pain, low back pain.  Derm: Negative for rash or itching.  Neuro: Negative for weakness, abnormal sensation, seizure, frequent headaches, memory loss, confusion.  Psych: Negative for anxiety, depression, suicidal ideation, hallucinations.  Endo: Negative for unusual weight change.  Heme: Negative for bruising or bleeding. Allergy: Negative for rash or hives.      Physical Examination:   Vital signs in last 24 hours: Temp:  [97.6 F (36.4 C)-98.5 F (36.9 C)] 97.9 F (36.6 C) (09/11 0447) Pulse Rate:  [63-75] 69 (09/11 0447) Resp:  [11-20] 20 (09/11 0447) BP: (132-177)/(54-73) 163/67 (09/11 0447) SpO2:  [93 %-100 %] 100 % (09/11 0447) Weight:  [66.7 kg-69.3 kg] 69.3 kg (09/10 1732) Last BM Date : 12/01/23  General: Well-nourished, well-developed in no acute distress.  Head: Normocephalic, atraumatic.   Eyes: Conjunctiva pink, no icterus. Mouth: Oropharyngeal mucosa moist and pink , no lesions erythema or exudate. Neck: Supple without thyromegaly, masses, or lymphadenopathy.  Lungs: Clear to auscultation bilaterally.  Heart: Regular rate and rhythm, 3/6 SEM. No rubs or gallops.  Abdomen: Bowel sounds are normal, nontender, nondistended, no hepatosplenomegaly or masses, no abdominal bruits or hernia , no rebound or guarding.   Rectal: not  performed Extremities: bilateral BKA.  Neuro: Alert and oriented x 4 , grossly normal neurologically.  Skin: Warm and dry, no rash or jaundice.   Psych: Alert and cooperative, normal mood and affect.        Intake/Output from previous day: 09/10 0701 - 09/11 0700 In: 440 [Blood:440] Out: -  Intake/Output this shift: Total I/O In: 480 [P.O.:480] Out: -   Lab Results:   CBC Recent Labs    12/01/23 1408 12/02/23 0505  WBC 7.1 7.1  HGB 6.8* 7.8*  HCT 22.8* 26.3*  MCV 82.9 85.7  PLT 288 264   BMET Recent Labs    12/01/23 1408 12/02/23 0505  NA 141 139  K 3.9 4.6  CL 99 101  CO2 28 27  GLUCOSE 209* 138*  BUN 15 24*  CREATININE 2.15* 2.94*  CALCIUM  8.6* 7.9*   LFT Recent Labs  12/01/23 1408  BILITOT 0.4  ALKPHOS 81  AST 18  ALT 8  PROT 6.4*  ALBUMIN  3.0*    Lipase No results for input(s): LIPASE in the last 72 hours.  PT/INR No results for input(s): LABPROT, INR in the last 72 hours.   Hepatitis Panel No results for input(s): HEPBSAG, HCVAB, HEPAIGM, HEPBIGM in the last 72 hours.   Imaging Studies:   CT Head Wo Contrast Result Date: 11/10/2023 EXAM: CT HEAD WITHOUT CONTRAST 11/10/2023 10:52:18 AM TECHNIQUE: CT of the head was performed without the administration of intravenous contrast. Automated exposure control, iterative reconstruction, and/or weight based adjustment of the mA/kV was utilized to reduce the radiation dose to as low as reasonably achievable. COMPARISON: MRI of the head dated 12/17/2022. CLINICAL HISTORY: Mental status change, unknown cause. Pt arrived via [facility] from dialysis, from home, pt was A\T\O this morning 0645 am before Tx and become lethargic and zoned out around 0800 this morning and was slow to respond, pt states she feels a little sleepy but able to answer EMSs questions. FINDINGS: BRAIN AND VENTRICLES: No acute hemorrhage. Gray-white differentiation is preserved. No hydrocephalus. No extra-axial collection.  No mass effect or midline shift. Chronic encephalomalacia changes present within the left parietal and occipital lobes. Mild periventricular white matter disease. ORBITS: No acute abnormality. SINUSES: No acute abnormality. SOFT TISSUES AND SKULL: No acute soft tissue abnormality. No skull fracture. Calcifications within the carotid siphons. IMPRESSION: 1. No acute intracranial abnormality. 2. Chronic encephalomalacia changes in the left parietal and occipital lobes. 3. Mild periventricular white matter disease. 4. Calcifications within the carotid siphons. Electronically signed by: Evalene Coho MD 11/10/2023 10:58 AM EDT RP Workstation: [id number]   DG Chest Portable 1 View Result Date: 11/10/2023 CLINICAL DATA:  Confusion.  Lethargy. EXAM: PORTABLE CHEST 1 VIEW COMPARISON:  10/27/2023. FINDINGS: Stable cardiomegaly. Aortic atherosclerosis. Similar streaky left basilar opacities, favored to reflect atelectasis. The right lung is clear. No sizable pleural effusion or pneumothorax. Degenerative changes of the right glenohumeral joint. No acute osseous abnormality. IMPRESSION: 1. Similar streaky left basilar opacities, favored to reflect atelectasis. 2. Stable cardiomegaly. Electronically Signed   By: Harrietta Sherry M.D.   On: 11/10/2023 10:10  [4 week]  Assessment:   68 y/o female with past medical history significant for GERD, pancreatitis, moderate aortic stenosis (ECHO 05/2023) recent new onset A-fib on apixaban  with cardioversion 10/27/2023, PAD (s/p R BKA in 05/2022, prior lithotripsy and stenting of the left superficial femoral and popliteal artery in 06/2022 and ultimately L BKA in 09/2022), HTN, HLD, Type 2 DM, bilateral adrenal adenoma (followed by Endocrinology), chronic HFpEF, OSA, ESRD (MWF), history of TIA's, history of recurrent chest pain  (cath in 12/2018 showing normal coronary arteries) recent hospitalization for left hand I&D, MSSA infection on antibiotics IV Ancef , last admission couple  of weeks ago with SIRS criteria due to white blood cell count (suspect UTI, hand abscess which was stable) and mental status changes (likely related to hypoglycemia) who presents now from dialysis for low hemoglobin.  Acute on chronic anemia/Hemoccult positive stool on Eliquis : -she reports chronic melena due to medication. Occasional brbpr in setting of constipation -last EGD 2019 -last colonoscopy 2018, was due in 2021 for surveillance (adenomatous colon polyps) -presenting with acute decline in Hgb from 8.9 to 6.8 in two weeks -last dose of Eliquis  was yesterday AM. -she would benefit from colonoscopy/EGD  Solid food dysphagia: -consider esophageal dilation at time of EGD  Constipation: -patient concerned about potential fecal incontinence or urgency  while on dialysis -consider miralax  one capful daily, titrate as needed  Plan:   Trend Hgb, transfuse as needed. Miralax  one capful daily. Continue clear liquids for now. IV PPI BID Will need to discuss moderate aortic stenosis with anesthesia, previously recommended procedures in [facility] with cardiac anesthesia.    LOS: 1 day   We would like to thank you for the opportunity to participate in the care of CYNARA TATHAM.  Sonny RAMAN. Ezzard RIGGERS [facility] [phone] 9/11/202511:41 AM

## 2023-12-02 NOTE — Assessment & Plan Note (Signed)
 Good  response to zoloft , increase dose and re assess in 3 months

## 2023-12-03 ENCOUNTER — Encounter (HOSPITAL_COMMUNITY): Payer: Self-pay | Admitting: Certified Registered"

## 2023-12-03 ENCOUNTER — Encounter (HOSPITAL_COMMUNITY)
Admission: EM | Disposition: A | Discharge status: Home Health | Payer: Self-pay | Source: Home / Self Care | Attending: Family Medicine

## 2023-12-03 ENCOUNTER — Inpatient Hospital Stay (HOSPITAL_COMMUNITY)

## 2023-12-03 ENCOUNTER — Encounter (HOSPITAL_COMMUNITY): Payer: Self-pay | Admitting: Internal Medicine

## 2023-12-03 DIAGNOSIS — T182XXA Foreign body in stomach, initial encounter: Secondary | ICD-10-CM

## 2023-12-03 DIAGNOSIS — G4733 Obstructive sleep apnea (adult) (pediatric): Secondary | ICD-10-CM | POA: Diagnosis not present

## 2023-12-03 DIAGNOSIS — N186 End stage renal disease: Secondary | ICD-10-CM

## 2023-12-03 DIAGNOSIS — I132 Hypertensive heart and chronic kidney disease with heart failure and with stage 5 chronic kidney disease, or end stage renal disease: Secondary | ICD-10-CM

## 2023-12-03 DIAGNOSIS — R1310 Dysphagia, unspecified: Secondary | ICD-10-CM | POA: Diagnosis not present

## 2023-12-03 DIAGNOSIS — I5032 Chronic diastolic (congestive) heart failure: Secondary | ICD-10-CM

## 2023-12-03 DIAGNOSIS — E1165 Type 2 diabetes mellitus with hyperglycemia: Secondary | ICD-10-CM | POA: Diagnosis not present

## 2023-12-03 DIAGNOSIS — K922 Gastrointestinal hemorrhage, unspecified: Secondary | ICD-10-CM | POA: Diagnosis not present

## 2023-12-03 DIAGNOSIS — I4891 Unspecified atrial fibrillation: Secondary | ICD-10-CM | POA: Diagnosis not present

## 2023-12-03 DIAGNOSIS — D509 Iron deficiency anemia, unspecified: Secondary | ICD-10-CM | POA: Diagnosis not present

## 2023-12-03 HISTORY — PX: ESOPHAGOGASTRODUODENOSCOPY: SHX5428

## 2023-12-03 HISTORY — PX: ESOPHAGEAL DILATION: SHX303

## 2023-12-03 LAB — RENAL FUNCTION PANEL
Albumin: 2.8 g/dL — ABNORMAL LOW (ref 3.5–5.0)
Anion gap: 9 (ref 5–15)
BUN: 29 mg/dL — ABNORMAL HIGH (ref 8–23)
CO2: 24 mmol/L (ref 22–32)
Calcium: 7.9 mg/dL — ABNORMAL LOW (ref 8.9–10.3)
Chloride: 99 mmol/L (ref 98–111)
Creatinine, Ser: 3.7 mg/dL — ABNORMAL HIGH (ref 0.44–1.00)
GFR, Estimated: 13 mL/min — ABNORMAL LOW (ref >60)
Glucose, Bld: 78 mg/dL (ref 70–99)
Phosphorus: 4.5 mg/dL (ref 2.5–4.6)
Potassium: 5 mmol/L (ref 3.5–5.1)
Sodium: 132 mmol/L — ABNORMAL LOW (ref 135–145)

## 2023-12-03 LAB — GLUCOSE, CAPILLARY
Glucose-Capillary: 66 mg/dL — ABNORMAL LOW (ref 70–99)
Glucose-Capillary: 177 mg/dL — ABNORMAL HIGH (ref 70–99)
Glucose-Capillary: 67 mg/dL — ABNORMAL LOW (ref 70–99)
Glucose-Capillary: 137 mg/dL — ABNORMAL HIGH (ref 70–99)
Glucose-Capillary: 88 mg/dL (ref 70–99)
Glucose-Capillary: 92 mg/dL (ref 70–99)

## 2023-12-03 LAB — CBC
HCT: 27.2 % — ABNORMAL LOW (ref 36.0–46.0)
Hemoglobin: 8.2 g/dL — ABNORMAL LOW (ref 12.0–15.0)
MCH: 25.9 pg — ABNORMAL LOW (ref 26.0–34.0)
MCHC: 30.1 g/dL (ref 30.0–36.0)
MCV: 86.1 fL (ref 80.0–100.0)
Platelets: 287 K/uL (ref 150–400)
RBC: 3.16 MIL/uL — ABNORMAL LOW (ref 3.87–5.11)
RDW: 19.3 % — ABNORMAL HIGH (ref 11.5–15.5)
WBC: 10.2 K/uL (ref 4.0–10.5)
nRBC: 0.3 % — ABNORMAL HIGH (ref 0.0–0.2)

## 2023-12-03 SURGERY — EGD (ESOPHAGOGASTRODUODENOSCOPY)
Anesthesia: General

## 2023-12-03 SURGERY — EGD (ESOPHAGOGASTRODUODENOSCOPY)
Anesthesia: Choice

## 2023-12-03 SURGERY — COLONOSCOPY
Anesthesia: Choice

## 2023-12-03 MED ORDER — PENTAFLUOROPROP-TETRAFLUOROETH EX AERO: 1.0000 | INHALATION_SPRAY | CUTANEOUS | Status: DC | PRN

## 2023-12-03 MED ORDER — SIMETHICONE 80 MG PO CHEW
240.0000 mg | CHEWABLE_TABLET | Freq: Once | ORAL | Status: DC
  Filled 2023-12-03 (×2): qty 3

## 2023-12-03 MED ORDER — INSULIN GLARGINE 100 UNIT/ML ~~LOC~~ SOLN
12.0000 [IU] | Freq: Every day | SUBCUTANEOUS | Status: DC
  Filled 2023-12-03: qty 0.12

## 2023-12-03 MED ORDER — DEXTROSE 50 % IV SOLN
INTRAVENOUS | Status: AC
  Filled 2023-12-03: qty 50

## 2023-12-03 MED ORDER — LIDOCAINE HCL (PF) 1 % IJ SOLN: 5.0000 mL | INTRAMUSCULAR | Status: DC | PRN

## 2023-12-03 MED ORDER — PEG 3350-KCL-NA BICARB-NACL 420 G PO SOLR: 4000.0000 mL | Freq: Once | ORAL | Status: DC

## 2023-12-03 MED ORDER — HEPARIN SODIUM (PORCINE) 1000 UNIT/ML DIALYSIS: 1000.0000 [IU] | INTRAMUSCULAR | Status: DC | PRN

## 2023-12-03 MED ORDER — PROPOFOL 10 MG/ML IV BOLUS
INTRAVENOUS | Status: DC | PRN
  Administered 2023-12-03: 30 mg via INTRAVENOUS

## 2023-12-03 MED ORDER — ANTICOAGULANT SODIUM CITRATE 4% (200MG/5ML) IV SOLN: 5.0000 mL | Status: DC | PRN

## 2023-12-03 MED ORDER — METOCLOPRAMIDE HCL 5 MG/ML IJ SOLN
5.0000 mg | Freq: Three times a day (TID) | INTRAMUSCULAR | Status: AC
  Administered 2023-12-04 (×2): 5 mg via INTRAVENOUS
  Filled 2023-12-03 (×3): qty 2

## 2023-12-03 MED ORDER — DEXTROSE 50 % IV SOLN
25.0000 mL | Freq: Once | INTRAVENOUS | Status: AC
  Administered 2023-12-03: 25 mL via INTRAVENOUS

## 2023-12-03 MED ORDER — BISACODYL 5 MG PO TBEC
10.0000 mg | DELAYED_RELEASE_TABLET | Freq: Once | ORAL | Status: DC
  Filled 2023-12-03 (×2): qty 2

## 2023-12-03 MED ORDER — KETAMINE HCL 50 MG/5ML IJ SOSY
PREFILLED_SYRINGE | INTRAMUSCULAR | Status: AC
  Filled 2023-12-03: qty 5

## 2023-12-03 MED ORDER — ALTEPLASE 2 MG IJ SOLR: 2.0000 mg | Freq: Once | INTRAMUSCULAR | Status: DC | PRN

## 2023-12-03 MED ORDER — DEXTROSE 50 % IV SOLN
INTRAVENOUS | Status: AC
  Administered 2023-12-03: 50 mL
  Filled 2023-12-03: qty 50

## 2023-12-03 MED ORDER — LIDOCAINE HCL (CARDIAC) PF 100 MG/5ML IV SOSY
PREFILLED_SYRINGE | INTRAVENOUS | Status: DC | PRN
  Administered 2023-12-03: 50 mg via INTRAVENOUS

## 2023-12-03 MED ORDER — GLUCOSE 40 % PO GEL
1.0000 | ORAL | Status: AC
  Filled 2023-12-03: qty 1

## 2023-12-03 MED ORDER — KETAMINE HCL 10 MG/ML IJ SOLN
INTRAMUSCULAR | Status: DC | PRN
Start: 2023-12-03 — End: 2023-12-03
  Administered 2023-12-03: 30 mg via INTRAVENOUS

## 2023-12-03 MED ORDER — LACTATED RINGERS IV SOLN: INTRAVENOUS | Status: DC

## 2023-12-03 MED ORDER — LIDOCAINE-PRILOCAINE 2.5-2.5 % EX CREA: 1.0000 | TOPICAL_CREAM | CUTANEOUS | Status: DC | PRN

## 2023-12-03 NOTE — Op Note (Signed)
 [facility] Patient Name: Kathryn Beck Procedure Date: 12/03/2023 10:50 AM MRN: [id number] Date of Birth: 25-Jan-1956 Attending MD: Carlin POUR. Cindie HAS, [id number] CSN: [id number] Age: 68 Admit Type: Inpatient Procedure:                Upper GI endoscopy Indications:              Iron deficiency anemia Providers:                Carlin POUR. Cindie, DO, Olam Ada, RN, Daphne Mulch                            Technician, Technician Referring MD:              Medicines:                See the Anesthesia note for documentation of the                            administered medications Complications:            No immediate complications. Estimated Blood Loss:     Estimated blood loss: none. Procedure:                Pre-Anesthesia Assessment:                           - The anesthesia plan was to use monitored                            anesthesia care (MAC).                           After obtaining informed consent, the endoscope was                            passed under direct vision. Throughout the                            procedure, the patient's blood pressure, pulse, and                            oxygen  saturations were monitored continuously. The                            [id number] ([id number]) Upper was introduced through                            the mouth, and advanced to the second part of                            duodenum. The upper GI endoscopy was accomplished                            without difficulty. The patient tolerated the                            procedure well. Scope In: 11:03:34  AM Scope Out: 11:05:50 AM Total Procedure Duration: 0 hours 2 minutes 16 seconds  Findings:      The examined esophagus was normal.      A medium amount of food (residue) was found in the gastric body.      The duodenal bulb, first portion of the duodenum and second portion of       the duodenum were normal. Impression:               - Normal esophagus.                            - A medium amount of food (residue) in the stomach.                           - Normal duodenal bulb, first portion of the                            duodenum and second portion of the duodenum.                           - No specimens collected. No active or stigmata of                            bleeding                           - Colonoscopy not attempted due to food in                            stomach/aspiration risk. Patient had BM in bed with                            solid brown stool so likely not adequately prepped Moderate Sedation:      Per Anesthesia Care Recommendation:           - Return patient to hospital ward for ongoing care.                           - Clear liquid diet.                           - Reglan  q8 hours x3 doses                           - Prep colon in anticipation of colonoscopy                            tomorrow morning if patient agreeable Procedure Code(s):        --- Professional ---                           [phone], Esophagogastroduodenoscopy, flexible,                            transoral; diagnostic, including collection of  specimen(s) by brushing or washing, when performed                            (separate procedure) Diagnosis Code(s):        --- Professional ---                           D50.9, Iron deficiency anemia, unspecified CPT copyright 2022 American Medical Association. All rights reserved. The codes documented in this report are preliminary and upon coder review may  be revised to meet current compliance requirements. Carlin POUR. Cindie, DO Carlin POUR. Cindie, DO 12/03/2023 11:14:31 AM This report has been signed electronically. Number of Addenda: 0

## 2023-12-03 NOTE — Plan of Care (Signed)

## 2023-12-03 NOTE — Transfer of Care (Signed)
 Immediate Anesthesia Transfer of Care Note  Patient: Kathryn Beck  Procedure(s) Performed: EGD (ESOPHAGOGASTRODUODENOSCOPY) DILATION, ESOPHAGUS COLONOSCOPY  Patient Location: PACU  Anesthesia Type:General  Level of Consciousness: awake, drowsy, and patient cooperative  Airway & Oxygen  Therapy: Patient Spontanous Breathing  Post-op Assessment: Report given to RN and Post -op Vital signs reviewed and stable  Post vital signs: Reviewed and stable  Last Vitals:  Vitals Value Taken Time  BP    Temp    Pulse 71 12/03/23 11:14  Resp 12 12/03/23 11:14  SpO2 100 % 12/03/23 11:14  Vitals shown include unfiled device data.  Last Pain:  Vitals:   12/03/23 1103  TempSrc:   PainSc: 0-No pain         Complications: No notable events documented.

## 2023-12-03 NOTE — Plan of Care (Signed)
  Problem: Education: Goal: Knowledge of General Education information will improve Description: Including pain rating scale, medication(s)/side effects and non-pharmacologic comfort measures Outcome: Progressing   Problem: Health Behavior/Discharge Planning: Goal: Ability to manage health-related needs will improve Outcome: Progressing   Problem: Clinical Measurements: Goal: Ability to maintain clinical measurements within normal limits will improve Outcome: Progressing Goal: Will remain free from infection Outcome: Progressing Goal: Diagnostic test results will improve Outcome: Progressing Goal: Respiratory complications will improve Outcome: Progressing Goal: Cardiovascular complication will be avoided Outcome: Progressing   Problem: Coping: Goal: Level of anxiety will decrease Outcome: Progressing   Problem: Elimination: Goal: Will not experience complications related to urinary retention Outcome: Progressing   Problem: Pain Managment: Goal: General experience of comfort will improve and/or be controlled Outcome: Progressing   Problem: Safety: Goal: Ability to remain free from injury will improve Outcome: Progressing   Problem: Skin Integrity: Goal: Risk for impaired skin integrity will decrease Outcome: Progressing   Problem: Activity: Goal: Risk for activity intolerance will decrease Outcome: Not Progressing   Problem: Nutrition: Goal: Adequate nutrition will be maintained Outcome: Not Progressing   Problem: Elimination: Goal: Will not experience complications related to bowel motility Outcome: Not Progressing

## 2023-12-03 NOTE — Progress Notes (Addendum)
 [facility] Progress Note  Name: Kathryn Beck MRN: [id number] DOB: 1955/04/30  Chief Complaint:  Anemia noted outpatient   Subjective:  Last HD outpatient on 9/10.  She had three stools over 9/11 with no urine output recorded over 9/11.  She was just taken down for an EGD and I saw her in the pre-op  area.   Review of systems:  Denies any shortness of breath or chest pain  Denies any nausea or vomiting   Has seen blood on the toilet paper for a little while  ------------------- Background on consult:  Kathryn Beck is a 68 y.o. female with history of ESRD Monday Wednesday Friday at [facility], atrial fibrillation, peripheral artery disease, hypertension, type 2 diabetes melitis, chronic HFpEF, and aortic stenosis who presented to [facility] ER from her outpatient HD unit with anemia.  When I initially asked why she came she states I think it's for my kidneys.  She had Hb 6.3 on outpatient labs per charting.  She states that she has had a little blood in her bowel movements for a while stating that she has seen blood on the toilet paper.  She was found to have a hemoglobin of 6.8 here.  She was noted to be FOBT positive.  She received 1 unit of PRBCs.  She states she doesn't really feel that different after the PRBC's.  Nephrology is consulted for assistance with management of end-stage renal disease.  She had her full dialysis treatment yesterday.  Note that she has been on cefazolin  for osteomyelitis.  She states this is for a wound on her left hand.  She does make a little urine.  Spoke with outpatient HD unit as below.  She was recently re-ordered cefazolin  2 gram with HD from 9/9 until 01/10/24.  She got on 12/01/23     Intake/Output Summary (Last 24 hours) at 12/03/2023 1014 Last data filed at 12/03/2023 0758 Gross per 24 hour  Intake 480 ml  Output --  Net 480 ml    Vitals:  Vitals:   12/02/23 0447 12/02/23 1339 12/02/23 2001 12/03/23 0516  BP: (!)  163/67 (!) 166/77 (!) 152/74 (!) 147/65  Pulse: 69 65 65 64  Resp: 20 17 16 18   Temp: 97.9 F (36.6 C) 98 F (36.7 C) 98.6 F (37 C) 98.5 F (36.9 C)  TempSrc: Oral     SpO2: 100%  99% 98%  Weight:      Height:         Physical Exam:  General elderly female in bed in no acute distress HEENT normocephalic atraumatic extraocular movements intact sclera anicteric Neck supple trachea midline Lungs clear to auscultation bilaterally normal work of breathing at rest on room air Heart S1S2 no rub Abdomen soft nontender nondistended Extremities bilateral BKA; left hand wrapped precluding much exam - prior digit amputation; no pitting edema   Psych normal mood and affect Neuro alert and oriented x 3 provides hx and follows commands  Access: LUE AVF with bruit and thrill   Medications reviewed   Labs:     Latest Ref Rng & Units 12/03/2023    5:25 AM 12/02/2023    5:05 AM 12/01/2023    2:08 PM  BMP  Glucose 70 - 99 mg/dL 78  861  790   BUN 8 - 23 mg/dL 29  24  15    Creatinine 0.44 - 1.00 mg/dL 6.29  7.05  7.84   Sodium 135 - 145 mmol/L 132  139  141   Potassium 3.5 - 5.1 mmol/L 5.0  4.6  3.9   Chloride 98 - 111 mmol/L 99  101  99   CO2 22 - 32 mmol/L 24  27  28    Calcium  8.9 - 10.3 mg/dL 7.9  7.9  8.6      Outpatient HD Rx:  MWF [facility] 3 hours and 30 minutes  EDW 70.5 kg (she gets to this usually) 2k/ 2.5 ca bath BF 400 DF 500 Heparin  1000 units loading and then 1000 units an hour Meds: mircera 60 mcg every 2 weeks and last given on 11/29/23 Calcitriol  0.75 mcg three times a week; cincalcet 90 mg three weeks - She was recently re-ordered cefazolin  2 grams with HD from 9/9 until 01/10/24.  She got the cefazolin  on 12/01/23     Assessment/Plan:   ESRD on HD - HD per MWF schedule - outpatient info as above    Anemia with acute blood loss - compounded by anemia of chronic disease - on ESA outpatient with HD and recently dosed - GI has seen and plans for  endoscopy on 9/12 inpatient per their last note   HTN - continue home meds for now  - optimize volume status with HD    Left hand osteomyelitis - she is on cefazolin  which was recently reinitiated - per team's exam concern is that this is gangrenous - per primary team   Chronic diastolic CHF - optimize volume with HD   Metabolic bone disease  - Continue binders when taking PO  - pausing sensipar  given her hypocalcemia - will need to assess outpatient at HD unit per trends  Disposition - per primary team    [name], MD 12/03/2023 10:40 AM  Addendum:  Nephrology will review the patient's labs over the weekend.  Please do not hesitate to reach out to nephrology on call with any questions regarding this patient.  If she remains inpatient, she will not be physically seen again until Monday  [name], MD 10:44 AM 12/03/2023

## 2023-12-03 NOTE — Progress Notes (Signed)
 Dr. Herschell notified of bld glucose 67 orders received

## 2023-12-03 NOTE — Interval H&P Note (Signed)
 History and Physical Interval Note:  12/03/2023 10:45 AM  Kathryn Beck  has presented today for surgery, with the diagnosis of poss. melena, rectal bleeding, acute on chronic anemia, hemoccult positve stool, dysphagia.  The various methods of treatment have been discussed with the patient and family. After consideration of risks, benefits and other options for treatment, the patient has consented to  Procedure(s): EGD (ESOPHAGOGASTRODUODENOSCOPY) (N/A) DILATION, ESOPHAGUS (N/A) COLONOSCOPY (N/A) as a surgical intervention.  The patient's history has been reviewed, patient examined, no change in status, stable for surgery.  I have reviewed the patient's chart and labs.  Questions were answered to the patient's satisfaction.     [name]

## 2023-12-03 NOTE — Care Management Important Message (Signed)
 Important Message  Patient Details  Name: Kathryn Beck MRN: [id number] Date of Birth: May 09, 1955   Important Message Given:  N/A - LOS <3 / Initial given by admissions     [name] 12/03/2023, 2:20 PM

## 2023-12-03 NOTE — Anesthesia Postprocedure Evaluation (Signed)
 Anesthesia Post Note  Patient: Kathryn Beck  Procedure(s) Performed: EGD (ESOPHAGOGASTRODUODENOSCOPY) DILATION, ESOPHAGUS  Patient location during evaluation: PACU Anesthesia Type: General Level of consciousness: awake and alert Pain management: pain level controlled Vital Signs Assessment: post-procedure vital signs reviewed and stable Respiratory status: spontaneous breathing, nonlabored ventilation, respiratory function stable and patient connected to nasal cannula oxygen  Cardiovascular status: blood pressure returned to baseline and stable Postop Assessment: no apparent nausea or vomiting Anesthetic complications: no   No notable events documented.   Last Vitals:  Vitals:   12/03/23 1033 12/03/23 1113  BP: (!) 152/68 (!) 141/59  Pulse: 66   Resp: (!) 23   Temp: 36.7 C 36.7 C  SpO2: 99%     Last Pain:  Vitals:   12/03/23 1103  TempSrc:   PainSc: 0-No pain                 Andrea Limes

## 2023-12-03 NOTE — Progress Notes (Signed)
 Bowel prep medications rescheduled for after dialysis per Dr. Cindie.

## 2023-12-03 NOTE — Progress Notes (Signed)
 CBG 64, Glutose15 administered, pt is on clear liquid.  Will continue to monitor.

## 2023-12-03 NOTE — Progress Notes (Signed)
 2129 Report given to dialysis RN, Obas. Consent placed in chart.

## 2023-12-03 NOTE — Progress Notes (Signed)
 PROGRESS NOTE   Kathryn Beck  FMW:[id number] DOB: 03/20/1956 DOA: 12/01/2023 PCP: Antonetta Rollene BRAVO, MD   Chief Complaint  Patient presents with   low hemoglobin   Level of care: Telemetry  Brief Admission History:  68 y.o. female, with significant history of Atrial fib on apixaban , PAD  (s/p R BKA in 05/2022, prior lithotripsy and stenting of the left superficial femoral and popliteal artery in 06/2022 and ultimately L BKA in 09/2022), HTN, HLD, Type 2 DM, bilateral adrenal adenoma (followed by Endocrinology), chronic HFpEF, aortic stenosis, OSA, ESRD (MWF), history of TIA's, history of recurrent chest pain  (cath in 12/2018 showing normal coronary arteries) recent hospitalization for left hand I&D, MSSA infection on antibiotics IV Ancef  with HD - She was sent by nephrologist after HD for anemia and low hemoglobin, labs done at the HD center noted for hemoglobin of 6.3, most recent baseline was 8.4 on 8/25, he is on apixaban , sent to ED for further evaluation.  In ED hemoglobin noted to be 6.8, he was Hemoccult positive, but stools were normal in color in rectal exam the ED physician, she was ordered 1 unit PRBC, ED discussed with GI who recommended clear liquid diet, holding Eliquis , starting on Protonix  and to admit to hospitalist service.    Assessment and Plan:  Symptomatic anemia Anemia of chronic kidney disease Hemoccult positive stool - Patient sent from HD center by her nephrologist given anemia of 6.8 - transfused 1 unit PRBC. - apixaban /aspirin  on hold for now - clear liquid diet, IV Protonix  - GI team consultation and recommendations are appreciated  - EGD colon today with [facility]   ESRD -Dialysis Monday Wednesday Friday scheduled -appreciate nephrology team consultation and recommendations -HD today per nephrology    Afib  -Heart rate is controlled, continue with Toprol -XL -Hold apixaban  due to above.   Depression - resumed home meds   History of CVA -  Continue statins, holding Eliquis     Chronic diastolic (congestive) heart failure -Start HD today, euvolemic, volume management with dialysis and diuretics as she makes some urine  -echo 06/15/2023, EF 55%.  No LVH,. Left ventricular diastolic parameters are consistent with Grade I diastolic dysfunction (impaired relaxation). Elevated left ventricular end-diastolic pressure.    HTN - Continue with metoprolol , will hold Norvasc  today, can resume tomorrow if blood pressure is elevated, continue with as needed hydralazine  meanwhile    Hyperlipidemia  - Continue statins   GAD (generalized anxiety disorder) - As needed benzodiazepines, will monitor due to confusion    PVD Bilateral BKA - aspirin  on hold temporarily due to GI bleeding   Diabetes mellitus type II, insulin -dependent - Last A1c 6.7 - She is on Semglee  30 units daily, reduced to 12 units and add insulin  sliding scale    DVT prophylaxis: This post bilateral BKA, so cannot apply SCDs, cannot do pharmacologic prophylaxis due to GI bleed  Code Status: Full  Family Communication: bedside update Disposition: anticipate home    Consultants:  GI Nephrology  Procedures:   Antimicrobials:    Subjective: Pt reports no specific complaints.   Objective: Vitals:   12/02/23 0447 12/02/23 1339 12/02/23 2001 12/03/23 0516  BP: (!) 163/67 (!) 166/77 (!) 152/74 (!) 147/65  Pulse: 69 65 65 64  Resp: 20 17 16 18   Temp: 97.9 F (36.6 C) 98 F (36.7 C) 98.6 F (37 C) 98.5 F (36.9 C)  TempSrc: Oral     SpO2: 100%  99% 98%  Weight:  Height:        Intake/Output Summary (Last 24 hours) at 12/03/2023 1035 Last data filed at 12/03/2023 0758 Gross per 24 hour  Intake 480 ml  Output --  Net 480 ml   Filed Weights   12/01/23 1347 12/01/23 1732  Weight: 66.7 kg 69.3 kg   Examination:  General exam: Appears calm and comfortable  Respiratory system: Clear to auscultation. Respiratory effort normal. Cardiovascular system:  normal S1 & S2 heard. No JVD, murmurs, rubs, gallops or clicks. No pedal edema. Gastrointestinal system: Abdomen is nondistended, soft and nontender. No organomegaly or masses felt. Normal bowel sounds heard. Central nervous system: Alert and oriented. No focal neurological deficits. 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  Skin: No rashes, lesions or ulcers. Psychiatry: Judgement and insight appear normal. Mood & affect appropriate.   Data Reviewed: I have personally reviewed following labs and imaging studies  CBC: Recent Labs  Lab 12/01/23 1408 12/02/23 0505 12/03/23 0525  WBC 7.1 7.1 10.2  HGB 6.8* 7.8* 8.2*  HCT 22.8* 26.3* 27.2*  MCV 82.9 85.7 86.1  PLT 288 264 287    Basic Metabolic Panel: Recent Labs  Lab 12/01/23 1408 12/02/23 0505 12/03/23 0525  NA 141 139 132*  K 3.9 4.6 5.0  CL 99 101 99  CO2 28 27 24   GLUCOSE 209* 138* 78  BUN 15 24* 29*  CREATININE 2.15* 2.94* 3.70*  CALCIUM  8.6* 7.9* 7.9*  PHOS  --   --  4.5    CBG: Recent Labs  Lab 12/02/23 1605 12/02/23 2000 12/03/23 0725 12/03/23 0750 12/03/23 1027  GLUCAP 99 173* 66* 177* 67*    No results found for this or any previous visit (from the past 240 hours).   Radiology Studies: No results found.  Scheduled Meds:  [MAR Hold] amLODipine   5 mg Oral Daily   [MAR Hold] busPIRone   7.5 mg Oral BID   [MAR Hold] calcitRIOL   1 mcg Oral Q M,W,F-HD   [MAR Hold] Chlorhexidine  Gluconate Cloth  6 each Topical Q0600   [MAR Hold] ezetimibe   10 mg Oral Daily   [MAR Hold] insulin  aspart  0-6 Units Subcutaneous TID WC   [MAR Hold] insulin  glargine  12 Units Subcutaneous QHS   [MAR Hold] metoprolol  succinate  50 mg Oral Q T,Th,S,Su   [MAR Hold] multivitamin  1 tablet Oral QHS   [MAR Hold] pantoprazole  (PROTONIX ) IV  40 mg Intravenous Q12H   [MAR Hold] polyethylene glycol  17 g Oral Daily   [MAR Hold] sertraline   75 mg Oral Daily   [MAR Hold] silver  sulfADIAZINE    Topical Q12H    [MAR Hold] sucroferric oxyhydroxide  1,000 mg Oral TID WC   [MAR Hold] torsemide   100 mg Oral Q T,Th,S,Su   Continuous Infusions:  sodium chloride      sodium chloride      [MAR Hold] anticoagulant sodium citrate      [MAR Hold]  ceFAZolin  (ANCEF ) IV     lactated ringers       LOS: 2 days   Time spent: 55 mins  Santhosh Gulino, MD How to contact the [facility] Attending or Consulting provider 7A - 7P or covering provider during after hours 7P -7A, for this patient?  Check the care team in [facility] and look for a) attending/consulting [facility] provider listed and b) the [facility] team listed Log into [email or web address] to find provider on call.  Locate the [facility] provider you are looking for under [facility] and page to a number  that you can be directly reached. If you still have difficulty reaching the provider, please page the [facility] (Director on Call) for the Hospitalists listed on amion for assistance.  12/03/2023, 10:35 AM

## 2023-12-03 NOTE — Anesthesia Preprocedure Evaluation (Addendum)
 Anesthesia Evaluation  Patient identified by MRN, date of birth, ID band Patient confused    Reviewed: Allergy & Precautions, H&P , NPO status , Patient's Chart, lab work & pertinent test results  Airway Mallampati: IV  TM Distance: >3 FB Neck ROM: Limited  Mouth opening: Limited Mouth Opening  Dental no notable dental hx.    Pulmonary sleep apnea , pneumonia   Pulmonary exam normal breath sounds clear to auscultation       Cardiovascular hypertension, + Peripheral Vascular Disease and +CHF  Normal cardiovascular exam+ Valvular Problems/Murmurs  Rhythm:Regular Rate:Normal     Neuro/Psych  Headaches PSYCHIATRIC DISORDERS Anxiety Depression     Neuromuscular disease CVA    GI/Hepatic Neg liver ROS,GERD  ,,  Endo/Other  diabetes    Renal/GU ESRF and DialysisRenal disease  negative genitourinary   Musculoskeletal  (+) Arthritis ,    Abdominal   Peds negative pediatric ROS (+)  Hematology  (+) Blood dyscrasia, anemia   Anesthesia Other Findings   Reproductive/Obstetrics negative OB ROS                              Anesthesia Physical Anesthesia Plan  ASA: 4 and emergent  Anesthesia Plan: General   Post-op Pain Management:    Induction: Intravenous  PONV Risk Score and Plan:   Airway Management Planned: Nasal Cannula  Additional Equipment:   Intra-op Plan:   Post-operative Plan:   Informed Consent: I have reviewed the patients History and Physical, chart, labs and discussed the procedure including the risks, benefits and alternatives for the proposed anesthesia with the patient or authorized representative who has indicated his/her understanding and acceptance.     Dental advisory given  Plan Discussed with: CRNA  Anesthesia Plan Comments:          Anesthesia Quick Evaluation

## 2023-12-04 ENCOUNTER — Encounter (HOSPITAL_COMMUNITY)
Admission: EM | Disposition: A | Discharge status: Home Health | Payer: Self-pay | Source: Home / Self Care | Attending: Family Medicine

## 2023-12-04 DIAGNOSIS — G4733 Obstructive sleep apnea (adult) (pediatric): Secondary | ICD-10-CM | POA: Diagnosis not present

## 2023-12-04 DIAGNOSIS — I4891 Unspecified atrial fibrillation: Secondary | ICD-10-CM | POA: Diagnosis not present

## 2023-12-04 DIAGNOSIS — K922 Gastrointestinal hemorrhage, unspecified: Secondary | ICD-10-CM | POA: Diagnosis not present

## 2023-12-04 DIAGNOSIS — E1165 Type 2 diabetes mellitus with hyperglycemia: Secondary | ICD-10-CM | POA: Diagnosis not present

## 2023-12-04 LAB — GLUCOSE, CAPILLARY
Glucose-Capillary: 101 mg/dL — ABNORMAL HIGH (ref 70–99)
Glucose-Capillary: 56 mg/dL — ABNORMAL LOW (ref 70–99)
Glucose-Capillary: 121 mg/dL — ABNORMAL HIGH (ref 70–99)
Glucose-Capillary: 55 mg/dL — ABNORMAL LOW (ref 70–99)
Glucose-Capillary: 73 mg/dL (ref 70–99)
Glucose-Capillary: 132 mg/dL — ABNORMAL HIGH (ref 70–99)
Glucose-Capillary: 137 mg/dL — ABNORMAL HIGH (ref 70–99)
Glucose-Capillary: 162 mg/dL — ABNORMAL HIGH (ref 70–99)

## 2023-12-04 LAB — RENAL FUNCTION PANEL
Albumin: 2.7 g/dL — ABNORMAL LOW (ref 3.5–5.0)
Anion gap: 11 (ref 5–15)
BUN: 15 mg/dL (ref 8–23)
CO2: 25 mmol/L (ref 22–32)
Calcium: 7.7 mg/dL — ABNORMAL LOW (ref 8.9–10.3)
Chloride: 97 mmol/L — ABNORMAL LOW (ref 98–111)
Creatinine, Ser: 2.61 mg/dL — ABNORMAL HIGH (ref 0.44–1.00)
GFR, Estimated: 20 mL/min — ABNORMAL LOW (ref >60)
Glucose, Bld: 92 mg/dL (ref 70–99)
Phosphorus: 3.2 mg/dL (ref 2.5–4.6)
Potassium: 3.8 mmol/L (ref 3.5–5.1)
Sodium: 133 mmol/L — ABNORMAL LOW (ref 135–145)

## 2023-12-04 LAB — CBC
HCT: 27.7 % — ABNORMAL LOW (ref 36.0–46.0)
Hemoglobin: 8 g/dL — ABNORMAL LOW (ref 12.0–15.0)
MCH: 26.1 pg (ref 26.0–34.0)
MCHC: 28.9 g/dL — ABNORMAL LOW (ref 30.0–36.0)
MCV: 90.2 fL (ref 80.0–100.0)
Platelets: 178 K/uL (ref 150–400)
RBC: 3.07 MIL/uL — ABNORMAL LOW (ref 3.87–5.11)
RDW: 19.5 % — ABNORMAL HIGH (ref 11.5–15.5)
WBC: 7.4 K/uL (ref 4.0–10.5)
nRBC: 0.4 % — ABNORMAL HIGH (ref 0.0–0.2)

## 2023-12-04 SURGERY — EGD (ESOPHAGOGASTRODUODENOSCOPY)
Anesthesia: Choice

## 2023-12-04 MED ORDER — POLYETHYLENE GLYCOL 3350 17 GM/SCOOP PO POWD
238.0000 g | Freq: Once | ORAL | Status: AC
  Administered 2023-12-04: 238 g via ORAL
  Filled 2023-12-04: qty 238

## 2023-12-04 MED ORDER — DEXTROSE 50 % IV SOLN
INTRAVENOUS | Status: AC
  Administered 2023-12-04: 25 mL
  Filled 2023-12-04: qty 50

## 2023-12-04 MED ORDER — PEG 3350-KCL-NA BICARB-NACL 420 G PO SOLR
4000.0000 mL | Freq: Once | ORAL | Status: AC
  Administered 2023-12-04: 4000 mL via ORAL

## 2023-12-04 MED ORDER — DEXTROSE 50 % IV SOLN
12.5000 g | INTRAVENOUS | Status: AC
  Administered 2023-12-04: 12.5 g via INTRAVENOUS

## 2023-12-04 NOTE — Plan of Care (Signed)

## 2023-12-04 NOTE — Progress Notes (Signed)
 Pt blood sugar was 55 mg/dL. IV Dextrose  given with effective results. MD at bedside and made aware. Pt alert and oriented with no distress noted.

## 2023-12-04 NOTE — Progress Notes (Signed)
 Received patient in bed to unit.  Alert and oriented.  Informed consent signed and in chart.   TX duration:3hours  Patient tolerated well.  Transported back to the room  Alert, without acute distress.  Hand-off given to patient's nurse.   Access used: fistula Access issues: none  Total UF removed: Medication(s) given: none Post HD VS: 159/93 Post HD weight:74.1 kg   12/04/23 0115  Vitals  Temp 97.7 F (36.5 C)  Temp Source Oral  BP (!) 162/69  MAP (mmHg) 95  BP Location Right Arm  BP Method Automatic  Patient Position (if appropriate) Lying  Pulse Rate 88  Pulse Rate Source Monitor  ECG Heart Rate 82  Resp (!) 24  Weight 70.1 kg  Type of Weight Post-Dialysis  Oxygen  Therapy  SpO2 100 %  O2 Device Room Air  Patient Activity (if Appropriate) In bed  Pulse Oximetry Type Continuous  During Treatment Monitoring  Blood Flow Rate (mL/min) 0 mL/min  Arterial Pressure (mmHg) 0 mmHg  Venous Pressure (mmHg) -0.4 mmHg  TMP (mmHg) -49.09 mmHg  Ultrafiltration Rate (mL/min) 1643 mL/min  Dialysate Flow Rate (mL/min) 0 ml/min  Duration of HD Treatment -hour(s) (S)  3.05 hour(s)  Cumulative Fluid Removed (mL) per Treatment  1352.79  Post Treatment  Dialyzer Clearance Lightly streaked  Hemodialysis Intake (mL) 0 mL  Liters Processed 72  Fluid Removed (mL) 1400 mL  Tolerated HD Treatment Yes  Post-Hemodialysis Comments HD tx not cokpleted as expected due to clotting x2. no complaints, pt is stable.  AVG/AVF Arterial Site Held (minutes) 10 minutes  AVG/AVF Venous Site Held (minutes) 10 minutes  Note  Patient Observations pt is in bed resting with eyes closed.  Fistula / Graft Left Upper arm Arteriovenous fistula  Placement Date/Time: 09/02/17 1150   Placed prior to admission: Yes  Orientation: Left  Access Location: Upper arm  Access Type: Arteriovenous fistula  Site Condition No complications  Fistula / Graft Assessment Present;Thrill;Bruit  Status Deaccessed   Drainage Description None      [facility]

## 2023-12-04 NOTE — Progress Notes (Signed)
 Notified by RN patient had not started any bowel prep until reaching floor after HD around 2AM. Patient refusing prep at this time, and likely will not be adequately prepped in time for a colonoscopy today. Will switch back to clear liquid, + Reorder for a prep to start at noon and maybe can do a slow prep today.   Dorn Dawson, MD  [facility]

## 2023-12-04 NOTE — Progress Notes (Signed)
 Patient's nurse told me that patient's blood sugar was less than 70. Patient is in dialysis and the dialysis nurse told her that the clear liquids on her tray would not bring her blood sugar up. I initiated the hypoglycemic protocol and override the glucose gel to give to the patient. I have noticed that this was not charted on the [facility] that it was given but a progress note was made by the dialysis nurse at [phone] [phone])

## 2023-12-04 NOTE — Progress Notes (Signed)
 PROGRESS NOTE   Kathryn Beck  FMW:[id number] DOB: Oct 18, 1955 DOA: 12/01/2023 PCP: Antonetta Rollene BRAVO, MD   Chief Complaint  Patient presents with   low hemoglobin   Level of care: Med-Surg  Brief Admission History:  68 y.o. female, with significant history of Atrial fib on apixaban , PAD  (s/p R BKA in 05/2022, prior lithotripsy and stenting of the left superficial femoral and popliteal artery in 06/2022 and ultimately L BKA in 09/2022), HTN, HLD, Type 2 DM, bilateral adrenal adenoma (followed by Endocrinology), chronic HFpEF, aortic stenosis, OSA, ESRD (MWF), history of TIA's, history of recurrent chest pain  (cath in 12/2018 showing normal coronary arteries) recent hospitalization for left hand I&D, MSSA infection on antibiotics IV Ancef  with HD - She was sent by nephrologist after HD for anemia and low hemoglobin, labs done at the HD center noted for hemoglobin of 6.3, most recent baseline was 8.4 on 8/25, he is on apixaban , sent to ED for further evaluation.  In ED hemoglobin noted to be 6.8, he was Hemoccult positive, but stools were normal in color in rectal exam the ED physician, she was ordered 1 unit PRBC, ED discussed with GI who recommended clear liquid diet, holding Eliquis , starting on Protonix  and to admit to hospitalist service.    Assessment and Plan:  Symptomatic anemia Anemia of chronic kidney disease Hemoccult positive stool - Patient sent from HD center by her nephrologist given anemia of 6.8 - transfused 1 unit PRBC. - apixaban /aspirin  on hold for now - clear liquid diet, IV Protonix  - GI team consultation and recommendations are appreciated  - EGD colon with [facility] postponed due to inadequate prep - new plan is for colonoscopy tomorrow   ESRD -Dialysis Monday Wednesday Friday scheduled -appreciate nephrology team consultation and recommendations -HD today per nephrology    Afib  -Heart rate is controlled, continue with Toprol -XL -Hold apixaban  due to  above.   Depression - resumed home meds   History of CVA - Continue statins, holding Eliquis     Chronic diastolic (congestive) heart failure -Start HD today, euvolemic, volume management with dialysis and diuretics as she makes some urine  -echo 06/15/2023, EF 55%.  No LVH,. Left ventricular diastolic parameters are consistent with Grade I diastolic dysfunction (impaired relaxation). Elevated left ventricular end-diastolic pressure.    HTN - Continue with metoprolol , amlodipine     Hyperlipidemia  - Continue statins   GAD (generalized anxiety disorder) - As needed benzodiazepines, will monitor due to confusion    PVD Bilateral BKA - aspirin  on hold temporarily due to GI bleeding   Diabetes mellitus type II, insulin -dependent - Last A1c 6.7 - Semglee  on hold due to low BS, continue renal sensitive insulin  sliding scale  CBG (last 3)  Recent Labs    12/04/23 0815 12/04/23 1119 12/04/23 1236  GLUCAP 121* 73 132*   Hypoglycemia - from poor oral intake - treated by RN with D50  - BS improved    DVT prophylaxis: This post bilateral BKA, so cannot apply SCDs, cannot do pharmacologic prophylaxis due to GI bleed  Code Status: Full  Family Communication: bedside update Disposition: anticipate home    Consultants:  GI Nephrology  Procedures:   Antimicrobials:    Subjective: Pt says she was too tired to complete the bowel prep due to very late HD treatment    Objective: Vitals:   12/04/23 0054 12/04/23 0115 12/04/23 0152 12/04/23 0435  BP:  (!) 162/69 (!) 146/65 (!) 146/67  Pulse:  88 82  83  Resp: (!) 35 (!) 24  18  Temp:  97.7 F (36.5 C) 97.6 F (36.4 C) 98.9 F (37.2 C)  TempSrc:  Oral Oral Oral  SpO2: 100% 100% 96% 95%  Weight:  70.1 kg    Height:        Intake/Output Summary (Last 24 hours) at 12/04/2023 1245 Last data filed at 12/04/2023 1023 Gross per 24 hour  Intake 720 ml  Output 1400 ml  Net -680 ml   Filed Weights   12/01/23 1732 12/03/23  1033 12/04/23 0115  Weight: 69.3 kg 69.3 kg 70.1 kg   Examination:  General exam: Appears calm and comfortable  Respiratory system: Clear to auscultation. Respiratory effort normal. Cardiovascular system: normal S1 & S2 heard. No JVD, murmurs, rubs, gallops or clicks. No pedal edema. Gastrointestinal system: Abdomen is nondistended, soft and nontender. No organomegaly or masses felt. Normal bowel sounds heard. Central nervous system: Alert and oriented. No focal neurological deficits. 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  Skin: No rashes, lesions or ulcers. Psychiatry: Judgement and insight appear normal. Mood & affect appropriate.   Data Reviewed: I have personally reviewed following labs and imaging studies  CBC: Recent Labs  Lab 12/01/23 1408 12/02/23 0505 12/03/23 0525 12/04/23 0832  WBC 7.1 7.1 10.2 7.4  HGB 6.8* 7.8* 8.2* 8.0*  HCT 22.8* 26.3* 27.2* 27.7*  MCV 82.9 85.7 86.1 90.2  PLT 288 264 287 178    Basic Metabolic Panel: Recent Labs  Lab 12/01/23 1408 12/02/23 0505 12/03/23 0525 12/04/23 0832  NA 141 139 132* 133*  K 3.9 4.6 5.0 3.8  CL 99 101 99 97*  CO2 28 27 24 25   GLUCOSE 209* 138* 78 92  BUN 15 24* 29* 15  CREATININE 2.15* 2.94* 3.70* 2.61*  CALCIUM  8.6* 7.9* 7.9* 7.7*  PHOS  --   --  4.5 3.2    CBG: Recent Labs  Lab 12/04/23 0225 12/04/23 0739 12/04/23 0815 12/04/23 1119 12/04/23 1236  GLUCAP 101* 55* 121* 73 132*   No results found for this or any previous visit (from the past 240 hours).   Radiology Studies: No results found.  Scheduled Meds:  amLODipine   5 mg Oral Daily   bisacodyl   10 mg Oral Once   busPIRone   7.5 mg Oral BID   calcitRIOL   1 mcg Oral Q M,W,F-HD   Chlorhexidine  Gluconate Cloth  6 each Topical Q0600   dextrose   1 Tube Oral STAT   ezetimibe   10 mg Oral Daily   insulin  aspart  0-6 Units Subcutaneous TID WC   metoCLOPramide  (REGLAN ) injection  5 mg Intravenous Q8H   metoprolol   succinate  50 mg Oral Q T,Th,S,Su   multivitamin  1 tablet Oral QHS   pantoprazole  (PROTONIX ) IV  40 mg Intravenous Q12H   polyethylene glycol  17 g Oral Daily   polyethylene glycol-electrolytes  4,000 mL Oral Once   sertraline   75 mg Oral Daily   silver  sulfADIAZINE    Topical Q12H   simethicone   240 mg Oral Once   sucroferric oxyhydroxide  1,000 mg Oral TID WC   torsemide   100 mg Oral Q T,Th,S,Su   Continuous Infusions:   ceFAZolin  (ANCEF ) IV      LOS: 3 days   Time spent: 55 mins  Kathryn Kohen Vicci, MD How to contact the [facility] Attending or Consulting provider 7A - 7P or covering provider during after hours 7P -7A, for this patient?  Check the care team  in [facility] and look for a) attending/consulting TRH provider listed and b) the TRH team listed Log into [email or web address] to find provider on call.  Locate the TRH provider you are looking for under [facility] and page to a number that you can be directly reached. If you still have difficulty reaching the provider, please page the [facility] (Director on Call) for the Hospitalists listed on amion for assistance.  12/04/2023, 12:45 PM

## 2023-12-05 ENCOUNTER — Encounter (HOSPITAL_COMMUNITY)
Admission: EM | Disposition: A | Discharge status: Home Health | Payer: Self-pay | Source: Home / Self Care | Attending: Family Medicine

## 2023-12-05 ENCOUNTER — Inpatient Hospital Stay (HOSPITAL_COMMUNITY)

## 2023-12-05 DIAGNOSIS — E1165 Type 2 diabetes mellitus with hyperglycemia: Secondary | ICD-10-CM | POA: Diagnosis not present

## 2023-12-05 DIAGNOSIS — K922 Gastrointestinal hemorrhage, unspecified: Secondary | ICD-10-CM | POA: Diagnosis not present

## 2023-12-05 DIAGNOSIS — G4733 Obstructive sleep apnea (adult) (pediatric): Secondary | ICD-10-CM | POA: Diagnosis not present

## 2023-12-05 DIAGNOSIS — I4891 Unspecified atrial fibrillation: Secondary | ICD-10-CM | POA: Diagnosis not present

## 2023-12-05 HISTORY — PX: COLONOSCOPY: SHX5424

## 2023-12-05 LAB — GLUCOSE, CAPILLARY
Glucose-Capillary: 64 mg/dL — ABNORMAL LOW (ref 70–99)
Glucose-Capillary: 154 mg/dL — ABNORMAL HIGH (ref 70–99)
Glucose-Capillary: 106 mg/dL — ABNORMAL HIGH (ref 70–99)
Glucose-Capillary: 93 mg/dL (ref 70–99)
Glucose-Capillary: 101 mg/dL — ABNORMAL HIGH (ref 70–99)
Glucose-Capillary: 103 mg/dL — ABNORMAL HIGH (ref 70–99)
Glucose-Capillary: 243 mg/dL — ABNORMAL HIGH (ref 70–99)

## 2023-12-05 LAB — CBC
HCT: 27.3 % — ABNORMAL LOW (ref 36.0–46.0)
Hemoglobin: 8.3 g/dL — ABNORMAL LOW (ref 12.0–15.0)
MCH: 25.7 pg — ABNORMAL LOW (ref 26.0–34.0)
MCHC: 30.4 g/dL (ref 30.0–36.0)
MCV: 84.5 fL (ref 80.0–100.0)
Platelets: 288 K/uL (ref 150–400)
RBC: 3.23 MIL/uL — ABNORMAL LOW (ref 3.87–5.11)
RDW: 18.6 % — ABNORMAL HIGH (ref 11.5–15.5)
WBC: 7.2 K/uL (ref 4.0–10.5)
nRBC: 0.4 % — ABNORMAL HIGH (ref 0.0–0.2)

## 2023-12-05 LAB — RENAL FUNCTION PANEL
Albumin: 2.7 g/dL — ABNORMAL LOW (ref 3.5–5.0)
Anion gap: 12 (ref 5–15)
BUN: 18 mg/dL (ref 8–23)
CO2: 23 mmol/L (ref 22–32)
Calcium: 7.6 mg/dL — ABNORMAL LOW (ref 8.9–10.3)
Chloride: 90 mmol/L — ABNORMAL LOW (ref 98–111)
Creatinine, Ser: 3.4 mg/dL — ABNORMAL HIGH (ref 0.44–1.00)
GFR, Estimated: 14 mL/min — ABNORMAL LOW (ref >60)
Glucose, Bld: 100 mg/dL — ABNORMAL HIGH (ref 70–99)
Phosphorus: 4.1 mg/dL (ref 2.5–4.6)
Potassium: 4.1 mmol/L (ref 3.5–5.1)
Sodium: 125 mmol/L — ABNORMAL LOW (ref 135–145)

## 2023-12-05 SURGERY — COLONOSCOPY
Anesthesia: Monitor Anesthesia Care

## 2023-12-05 MED ORDER — FENTANYL CITRATE (PF) 100 MCG/2ML IJ SOLN
INTRAMUSCULAR | Status: DC | PRN
  Administered 2023-12-05 (×2): 50 ug via INTRAVENOUS

## 2023-12-05 MED ORDER — FENTANYL CITRATE (PF) 100 MCG/2ML IJ SOLN
INTRAMUSCULAR | Status: AC
  Filled 2023-12-05: qty 2

## 2023-12-05 MED ORDER — GUAIFENESIN ER 600 MG PO TB12
600.0000 mg | ORAL_TABLET | Freq: Two times a day (BID) | ORAL | Status: DC
  Administered 2023-12-05 – 2023-12-07 (×5): 600 mg via ORAL
  Filled 2023-12-05 (×5): qty 1

## 2023-12-05 MED ORDER — DEXTROMETHORPHAN POLISTIREX ER 30 MG/5ML PO SUER
30.0000 mg | Freq: Two times a day (BID) | ORAL | Status: DC | PRN
  Administered 2023-12-05: 30 mg via ORAL
  Filled 2023-12-05: qty 5

## 2023-12-05 MED ORDER — MIDAZOLAM HCL 5 MG/5ML IJ SOLN
INTRAMUSCULAR | Status: DC | PRN
Start: 2023-12-05 — End: 2023-12-05
  Administered 2023-12-05: 2 mg via INTRAVENOUS

## 2023-12-05 MED ORDER — TRAZODONE HCL 50 MG PO TABS
25.0000 mg | ORAL_TABLET | Freq: Every evening | ORAL | Status: DC | PRN
  Administered 2023-12-05 – 2023-12-06 (×2): 25 mg via ORAL
  Filled 2023-12-05 (×2): qty 1

## 2023-12-05 MED ORDER — MIDAZOLAM HCL 2 MG/2ML IJ SOLN
INTRAMUSCULAR | Status: AC
  Filled 2023-12-05: qty 2

## 2023-12-05 NOTE — Anesthesia Procedure Notes (Signed)
 Procedure Name: MAC Date/Time: 12/05/2023 10:09 AM  Performed by: Herschell Hollering, MDPre-anesthesia Checklist: Patient identified Patient Re-evaluated:Patient Re-evaluated prior to induction Oxygen  Delivery Method: Nasal cannula

## 2023-12-05 NOTE — Anesthesia Postprocedure Evaluation (Signed)
 Anesthesia Post Note  Patient: Kathryn Beck  Procedure(s) Performed: COLONOSCOPY  Patient location during evaluation: PACU Anesthesia Type: MAC Level of consciousness: awake and alert Pain management: pain level controlled Vital Signs Assessment: post-procedure vital signs reviewed and stable Respiratory status: spontaneous breathing, nonlabored ventilation, respiratory function stable and patient connected to nasal cannula oxygen  Cardiovascular status: stable and blood pressure returned to baseline Postop Assessment: no apparent nausea or vomiting Anesthetic complications: no   No notable events documented.   Last Vitals:  Vitals:   12/05/23 1000 12/05/23 1038  BP: (!) 166/76   Pulse: 75   Resp: 16   Temp: 36.9 C 37.1 C  SpO2:      Last Pain:  Vitals:   12/05/23 1000  TempSrc: Oral  PainSc: 0-No pain                 Andrea Limes

## 2023-12-05 NOTE — Progress Notes (Signed)
 PROGRESS NOTE   Kathryn Beck  FMW:[id number] DOB: 1955-06-08 DOA: 12/01/2023 PCP: Antonetta Rollene BRAVO, MD   Chief Complaint  Patient presents with   low hemoglobin   Level of care: Med-Surg  Brief Admission History:  68 y.o. female, with significant history of Atrial fib on apixaban , PAD  (s/p R BKA in 05/2022, prior lithotripsy and stenting of the left superficial femoral and popliteal artery in 06/2022 and ultimately L BKA in 09/2022), HTN, HLD, Type 2 DM, bilateral adrenal adenoma (followed by Endocrinology), chronic HFpEF, aortic stenosis, OSA, ESRD (MWF), history of TIA's, history of recurrent chest pain  (cath in 12/2018 showing normal coronary arteries) recent hospitalization for left hand I&D, MSSA infection on antibiotics IV Ancef  with HD - She was sent by nephrologist after HD for anemia and low hemoglobin, labs done at the HD center noted for hemoglobin of 6.3, most recent baseline was 8.4 on 8/25, he is on apixaban , sent to ED for further evaluation.  In ED hemoglobin noted to be 6.8, he was Hemoccult positive, but stools were normal in color in rectal exam the ED physician, she was ordered 1 unit PRBC, ED discussed with GI who recommended clear liquid diet, holding Eliquis , starting on Protonix  and to admit to hospitalist service.    Assessment and Plan:  Symptomatic anemia Anemia of chronic kidney disease Hemoccult positive stool - Patient sent from HD center by her nephrologist given anemia of 6.8 - transfused 1 unit PRBC. - apixaban /aspirin  on hold for now, but can restart tomorrow - renal/carb diet, IV Protonix  - GI team consultation and recommendations are appreciated  - colonoscopy with findings of right sided diverticulosis and 2 polyps removed, diverticulosis felt to be source of bleed.  - plan is for patient to restart anticoagulation tomorrow per GI team - recheck H/H in AM    ESRD -Dialysis Monday Wednesday Friday scheduled -appreciate nephrology team  consultation and recommendations -HD per nephrology    Afib  -Heart rate is controlled, continue with Toprol -XL -Hold apixaban  due to above.-- restart on 9/15   Depression - resumed home meds   History of CVA - Continue statins, holding Eliquis     Chronic diastolic (congestive) heart failure -Start HD today, euvolemic, volume management with dialysis and diuretics as she makes some urine  -echo 06/15/2023, EF 55%.  No LVH,. Left ventricular diastolic parameters are consistent with Grade I diastolic dysfunction (impaired relaxation). Elevated left ventricular end-diastolic pressure.    HTN - Continue with metoprolol , amlodipine     Hyperlipidemia  - Continue statins   GAD (generalized anxiety disorder) - As needed benzodiazepines, will monitor due to confusion    PVD Bilateral BKA - aspirin  on hold temporarily due to GI bleeding, restart on 9/15 per GI   Diabetes mellitus type II, insulin -dependent - Last A1c 6.7 - Semglee  on hold due to low BS, continue renal sensitive insulin  sliding scale  CBG (last 3)  Recent Labs    12/05/23 0727 12/05/23 0919 12/05/23 1131  GLUCAP 106* 93 101*   Hypoglycemia - from poor oral intake - treated by RN with D50  - BS improved    DVT prophylaxis: This post bilateral BKA, so cannot apply SCDs, cannot do pharmacologic prophylaxis due to GI bleed  Code Status: Full  Family Communication: bedside update Disposition: anticipate home    Consultants:  GI Nephrology  Procedures:   Antimicrobials:    Subjective: Pt was able to complete the bowel prep, agreeable to procedure, no specific complaints.  Objective: Vitals:   12/05/23 1000 12/05/23 1038 12/05/23 1045 12/05/23 1100  BP: (!) 166/76 (!) 142/64 (!) 141/53 135/67  Pulse: 75 76 74 72  Resp: 16 20 17 14   Temp: 98.4 F (36.9 C) 98.7 F (37.1 C)    TempSrc: Oral     SpO2:  95% 94% 97%  Weight:      Height:        Intake/Output Summary (Last 24 hours) at 12/05/2023  1413 Last data filed at 12/05/2023 1200 Gross per 24 hour  Intake 1200 ml  Output --  Net 1200 ml   Filed Weights   12/01/23 1732 12/03/23 1033 12/04/23 0115  Weight: 69.3 kg 69.3 kg 70.1 kg   Examination:  General exam: Appears calm and comfortable  Respiratory system: Clear to auscultation. Respiratory effort normal. Cardiovascular system: normal S1 & S2 heard. No JVD, murmurs, rubs, gallops or clicks. No pedal edema. Gastrointestinal system: Abdomen is nondistended, soft and nontender. No organomegaly or masses felt. Normal bowel sounds heard. Central nervous system: Alert and oriented. No focal neurological deficits. 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  Skin: No rashes, lesions or ulcers. Psychiatry: Judgement and insight appear normal. Mood & affect appropriate.   Data Reviewed: I have personally reviewed following labs and imaging studies  CBC: Recent Labs  Lab 12/01/23 1408 12/02/23 0505 12/03/23 0525 12/04/23 0832 12/05/23 0637  WBC 7.1 7.1 10.2 7.4 7.2  HGB 6.8* 7.8* 8.2* 8.0* 8.3*  HCT 22.8* 26.3* 27.2* 27.7* 27.3*  MCV 82.9 85.7 86.1 90.2 84.5  PLT 288 264 287 178 288    Basic Metabolic Panel: Recent Labs  Lab 12/01/23 1408 12/02/23 0505 12/03/23 0525 12/04/23 0832 12/05/23 0446  NA 141 139 132* 133* 125*  K 3.9 4.6 5.0 3.8 4.1  CL 99 101 99 97* 90*  CO2 28 27 24 25 23   GLUCOSE 209* 138* 78 92 100*  BUN 15 24* 29* 15 18  CREATININE 2.15* 2.94* 3.70* 2.61* 3.40*  CALCIUM  8.6* 7.9* 7.9* 7.7* 7.6*  PHOS  --   --  4.5 3.2 4.1    CBG: Recent Labs  Lab 12/04/23 2039 12/05/23 0455 12/05/23 0727 12/05/23 0919 12/05/23 1131  GLUCAP 162* 154* 106* 93 101*   No results found for this or any previous visit (from the past 240 hours).   Radiology Studies: No results found.  Scheduled Meds:  amLODipine   5 mg Oral Daily   bisacodyl   10 mg Oral Once   busPIRone   7.5 mg Oral BID   calcitRIOL   1 mcg Oral Q M,W,F-HD    Chlorhexidine  Gluconate Cloth  6 each Topical Q0600   ezetimibe   10 mg Oral Daily   guaiFENesin   600 mg Oral BID   insulin  aspart  0-6 Units Subcutaneous TID WC   metoprolol  succinate  50 mg Oral Q T,Th,S,Su   multivitamin  1 tablet Oral QHS   pantoprazole  (PROTONIX ) IV  40 mg Intravenous Q12H   polyethylene glycol  17 g Oral Daily   sertraline   75 mg Oral Daily   silver  sulfADIAZINE    Topical Q12H   simethicone   240 mg Oral Once   sucroferric oxyhydroxide  1,000 mg Oral TID WC   torsemide   100 mg Oral Q T,Th,S,Su   Continuous Infusions:   ceFAZolin  (ANCEF ) IV      LOS: 4 days   Time spent: 55 mins  Kathryn Salim Vicci, MD How to contact the [facility] Attending or Consulting provider 7A -  7P or covering provider during after hours 7P -7A, for this patient?  Check the care team in [facility] and look for a) attending/consulting TRH provider listed and b) the TRH team listed Log into [email or web address] to find provider on call.  Locate the TRH provider you are looking for under [facility] and page to a number that you can be directly reached. If you still have difficulty reaching the provider, please page the [facility] (Director on Call) for the Hospitalists listed on amion for assistance.  12/05/2023, 2:13 PM

## 2023-12-05 NOTE — Plan of Care (Signed)

## 2023-12-05 NOTE — Interval H&P Note (Signed)
 History and Physical Interval Note:  12/05/2023 9:52 AM  Kathryn Beck  has presented today for surgery, with the diagnosis of Acute blood loss anemia.  The various methods of treatment have been discussed with the patient and family. After consideration of risks, benefits and other options for treatment, the patient has consented to  Procedure(s): COLONOSCOPY (N/A) as a surgical intervention.  The patient's history has been reviewed, patient examined, no change in status, stable for surgery.  I have reviewed the patient's chart and labs.  Questions were answered to the patient's satisfaction.     Kathryn Beck

## 2023-12-05 NOTE — Plan of Care (Signed)
   Problem: Education: Goal: Knowledge of General Education information will improve Description Including pain rating scale, medication(s)/side effects and non-pharmacologic comfort measures Outcome: Progressing   Problem: Health Behavior/Discharge Planning: Goal: Ability to manage health-related needs will improve Outcome: Progressing

## 2023-12-05 NOTE — Anesthesia Preprocedure Evaluation (Addendum)
 Anesthesia Evaluation  Patient identified by MRN, date of birth, ID band Patient awake    Reviewed: Allergy & Precautions, H&P , NPO status , Patient's Chart, lab work & pertinent test results  Airway Mallampati: IV  TM Distance: <3 FB Neck ROM: Full  Mouth opening: Limited Mouth Opening  Dental   Pulmonary sleep apnea , pneumonia Severe OSA   Pulmonary exam normal breath sounds clear to auscultation       Cardiovascular hypertension, + Peripheral Vascular Disease and +CHF  Normal cardiovascular exam+ Valvular Problems/Murmurs  Rhythm:Regular Rate:Normal     Neuro/Psych  Headaches PSYCHIATRIC DISORDERS Anxiety Depression     Neuromuscular disease CVA    GI/Hepatic Neg liver ROS,GERD  ,,  Endo/Other  diabetes, Insulin  Dependent    Renal/GU Dialysis and ESRFRenal disease  negative genitourinary   Musculoskeletal  (+) Arthritis ,    Abdominal   Peds negative pediatric ROS (+)  Hematology  (+) Blood dyscrasia, anemia   Anesthesia Other Findings   Reproductive/Obstetrics negative OB ROS                              Anesthesia Physical Anesthesia Plan  ASA: 4 and emergent  Anesthesia Plan: MAC   Post-op Pain Management:    Induction:   PONV Risk Score and Plan:   Airway Management Planned: Nasal Cannula  Additional Equipment:   Intra-op Plan:   Post-operative Plan:   Informed Consent: I have reviewed the patients History and Physical, chart, labs and discussed the procedure including the risks, benefits and alternatives for the proposed anesthesia with the patient or authorized representative who has indicated his/her understanding and acceptance.       Plan Discussed with: Surgeon  Anesthesia Plan Comments:          Anesthesia Quick Evaluation

## 2023-12-05 NOTE — Op Note (Signed)
 [facility] Patient Name: Kathryn Beck Procedure Date: 12/05/2023 9:48 AM MRN: [id number] Date of Birth: 05/24/1955 Attending MD: Carlin POUR. Cindie HAS, [id number] CSN: [id number] Age: 68 Admit Type: Inpatient Procedure:                Colonoscopy Indications:              Rectal bleeding, Iron deficiency anemia Providers:                Carlin POUR. Cindie, DO, Olam Ada, RN, Gordy Bruckner Tech, Technician Referring MD:              Medicines:                See the Anesthesia note for documentation of the                            administered medications Complications:            No immediate complications. Estimated Blood Loss:     Estimated blood loss was minimal. Procedure:                Pre-Anesthesia Assessment:                           - The anesthesia plan was to use monitored                            anesthesia care (MAC).                           After obtaining informed consent, the colonoscope                            was passed under direct vision. Throughout the                            procedure, the patient's blood pressure, pulse, and                            oxygen  saturations were monitored continuously. The                            [id number]-[id number] ([id number]) was introduced through the                            anus and advanced to the the cecum, identified by                            appendiceal orifice and ileocecal valve. The                            colonoscopy was performed without difficulty. The                            patient tolerated the procedure well.  The quality                            of the bowel preparation was evaluated using the                            BBPS [facility] Bowel Preparation Scale) with scores                            of: Right Colon = 2 (minor amount of residual                            staining, small fragments of stool and/or opaque                            liquid, but  mucosa seen well), Transverse Colon = 2                            (minor amount of residual staining, small fragments                            of stool and/or opaque liquid, but mucosa seen                            well) and Left Colon = 2 (minor amount of residual                            staining, small fragments of stool and/or opaque                            liquid, but mucosa seen well). The total BBPS score                            equals 6. The quality of the bowel preparation was                            good. Scope In: 10:15:58 AM Scope Out: 10:34:35 AM Scope Withdrawal Time: 0 hours 11 minutes 29 seconds  Total Procedure Duration: 0 hours 18 minutes 37 seconds  Findings:      Non-bleeding internal hemorrhoids were found.      Scattered large-mouthed and small-mouthed diverticula were found in the       transverse colon, hepatic flexure and ascending colon.      A 10 mm polyp was found in the ascending colon. The polyp was sessile.       The polyp was removed with a cold snare. Resection and retrieval were       complete.      A 5 mm polyp was found in the transverse colon. The polyp was sessile.       The polyp was removed with a cold snare. Resection and retrieval were       complete.      Evidence of barotrauma in the cecum and ascending colon. Impression:               -  Non-bleeding internal hemorrhoids.                           - Diverticulosis in the sigmoid colon, at the                            hepatic flexure and in the ascending colon.                           - One 10 mm polyp in the ascending colon, removed                            with a cold snare. Resected and retrieved.                           - One 5 mm polyp in the transverse colon, removed                            with a cold snare. Resected and retrieved. Moderate Sedation:      Per Anesthesia Care Recommendation:           - Return patient to hospital ward for ongoing care.                            - Resume regular diet.                           - Continue to monitor H&H                           - Likely bled from R sided diverticular disease vs                            benign hemorrhoidal.                           - If evidence of further bleeding, would proceed                            with CT angio and consider IR consultation                           - Resume Eliquis  tomorrow. Procedure Code(s):        --- Professional ---                           [phone], Colonoscopy, flexible; with removal of                            tumor(s), polyp(s), or other lesion(s) by snare                            technique Diagnosis Code(s):        --- Professional ---  K64.8, Other hemorrhoids                           D12.2, Benign neoplasm of ascending colon                           D12.3, Benign neoplasm of transverse colon (hepatic                            flexure or splenic flexure)                           K62.5, Hemorrhage of anus and rectum                           D50.9, Iron deficiency anemia, unspecified                           K57.30, Diverticulosis of large intestine without                            perforation or abscess without bleeding CPT copyright 2022 American Medical Association. All rights reserved. The codes documented in this report are preliminary and upon coder review may  be revised to meet current compliance requirements. Carlin POUR. Cindie, DO Carlin POUR. Cindie, DO 12/05/2023 10:46:40 AM This report has been signed electronically. Number of Addenda: 0

## 2023-12-05 NOTE — Transfer of Care (Signed)
 Immediate Anesthesia Transfer of Care Note  Patient: Kathryn Beck  Procedure(s) Performed: COLONOSCOPY  Patient Location: PACU  Anesthesia Type:MAC  Level of Consciousness: awake and alert   Airway & Oxygen  Therapy: Patient Spontanous Breathing  Post-op Assessment: Report given to RN and Post -op Vital signs reviewed and stable  Post vital signs: Reviewed and stable  Last Vitals:  Vitals Value Taken Time  BP    Temp    Pulse    Resp    SpO2      Last Pain:  Vitals:   12/05/23 1000  TempSrc: Oral  PainSc: 0-No pain      Patients Stated Pain Goal: 2 (12/05/23 1000)  Complications: No notable events documented.

## 2023-12-06 ENCOUNTER — Encounter (HOSPITAL_COMMUNITY): Payer: Self-pay | Admitting: Internal Medicine

## 2023-12-06 ENCOUNTER — Inpatient Hospital Stay (HOSPITAL_COMMUNITY)

## 2023-12-06 DIAGNOSIS — N186 End stage renal disease: Secondary | ICD-10-CM | POA: Diagnosis not present

## 2023-12-06 DIAGNOSIS — R195 Other fecal abnormalities: Secondary | ICD-10-CM | POA: Diagnosis not present

## 2023-12-06 DIAGNOSIS — K922 Gastrointestinal hemorrhage, unspecified: Secondary | ICD-10-CM | POA: Diagnosis not present

## 2023-12-06 DIAGNOSIS — D500 Iron deficiency anemia secondary to blood loss (chronic): Secondary | ICD-10-CM | POA: Diagnosis not present

## 2023-12-06 DIAGNOSIS — D72829 Elevated white blood cell count, unspecified: Secondary | ICD-10-CM

## 2023-12-06 DIAGNOSIS — I96 Gangrene, not elsewhere classified: Secondary | ICD-10-CM | POA: Diagnosis present

## 2023-12-06 DIAGNOSIS — K5900 Constipation, unspecified: Secondary | ICD-10-CM | POA: Diagnosis not present

## 2023-12-06 DIAGNOSIS — E1165 Type 2 diabetes mellitus with hyperglycemia: Secondary | ICD-10-CM | POA: Diagnosis not present

## 2023-12-06 DIAGNOSIS — G4733 Obstructive sleep apnea (adult) (pediatric): Secondary | ICD-10-CM | POA: Diagnosis not present

## 2023-12-06 LAB — CBC
HCT: 25.1 % — ABNORMAL LOW (ref 36.0–46.0)
Hemoglobin: 7.8 g/dL — ABNORMAL LOW (ref 12.0–15.0)
MCH: 25.6 pg — ABNORMAL LOW (ref 26.0–34.0)
MCHC: 31.1 g/dL (ref 30.0–36.0)
MCV: 82.3 fL (ref 80.0–100.0)
Platelets: 263 K/uL (ref 150–400)
RBC: 3.05 MIL/uL — ABNORMAL LOW (ref 3.87–5.11)
RDW: 18.3 % — ABNORMAL HIGH (ref 11.5–15.5)
WBC: 15.5 K/uL — ABNORMAL HIGH (ref 4.0–10.5)
nRBC: 0.2 % (ref 0.0–0.2)

## 2023-12-06 LAB — RENAL FUNCTION PANEL
Albumin: 2.4 g/dL — ABNORMAL LOW (ref 3.5–5.0)
Anion gap: 13 (ref 5–15)
BUN: 27 mg/dL — ABNORMAL HIGH (ref 8–23)
CO2: 24 mmol/L (ref 22–32)
Calcium: 7.7 mg/dL — ABNORMAL LOW (ref 8.9–10.3)
Chloride: 93 mmol/L — ABNORMAL LOW (ref 98–111)
Creatinine, Ser: 4.53 mg/dL — ABNORMAL HIGH (ref 0.44–1.00)
GFR, Estimated: 10 mL/min — ABNORMAL LOW (ref >60)
Glucose, Bld: 178 mg/dL — ABNORMAL HIGH (ref 70–99)
Phosphorus: 4.8 mg/dL — ABNORMAL HIGH (ref 2.5–4.6)
Potassium: 4.3 mmol/L (ref 3.5–5.1)
Sodium: 130 mmol/L — ABNORMAL LOW (ref 135–145)

## 2023-12-06 LAB — GLUCOSE, CAPILLARY
Glucose-Capillary: 190 mg/dL — ABNORMAL HIGH (ref 70–99)
Glucose-Capillary: 159 mg/dL — ABNORMAL HIGH (ref 70–99)
Glucose-Capillary: 151 mg/dL — ABNORMAL HIGH (ref 70–99)
Glucose-Capillary: 240 mg/dL — ABNORMAL HIGH (ref 70–99)

## 2023-12-06 LAB — PROCALCITONIN: Procalcitonin: 23.43 ng/mL

## 2023-12-06 MED ORDER — PANTOPRAZOLE SODIUM 40 MG PO TBEC
40.0000 mg | DELAYED_RELEASE_TABLET | Freq: Every day | ORAL | Status: DC
  Administered 2023-12-06 – 2023-12-07 (×2): 40 mg via ORAL
  Filled 2023-12-06 (×2): qty 1

## 2023-12-06 MED ORDER — ASPIRIN 81 MG PO CHEW
162.0000 mg | CHEWABLE_TABLET | Freq: Every day | ORAL | Status: DC
  Administered 2023-12-06 – 2023-12-07 (×2): 162 mg via ORAL
  Filled 2023-12-06 (×2): qty 2

## 2023-12-06 MED ORDER — APIXABAN 5 MG PO TABS
5.0000 mg | ORAL_TABLET | Freq: Two times a day (BID) | ORAL | Status: DC
  Administered 2023-12-06 – 2023-12-07 (×3): 5 mg via ORAL
  Filled 2023-12-06 (×2): qty 1

## 2023-12-06 NOTE — Procedures (Signed)
 Received patient in bed to unit.  Alert and oriented.  Informed consent signed and in chart.  LUA AVF cannulated x 2 with 15g needles per policy, without difficulty. Tx initiated per MD order. Pt tolerating well. 1045 TMP alarming, system clotting. Blood returned system changed. 1110 Tx restarted 1215 TMP alarming, system clotting. Blood returned. Machine changed and system reset. With the remaining 2 hours and remaining UF goal of 1900 ml. 1300 Tx restarted with machine #1. Will continue to monitor. 1315 Machine alarming with RO failure, Tx ended blood returned. Blood returned. TX duration:1:58 Dr Rayburn notified. Needles removed, sites held x 2 until hemostasis achieved. Gauze changed prior to taping. Patient tolerated well.  Transported back to the room  Alert, without acute distress.  Hand-off given to patient's nurse.   Access used: LUA AVF Access issues: none  Total UF removed: 200 ml Medication(s) given: See MAR   [facility]

## 2023-12-06 NOTE — Plan of Care (Signed)

## 2023-12-06 NOTE — Progress Notes (Signed)
 PROGRESS NOTE   [name]  FMW:[id number] DOB: 10-10-1955 DOA: 12/01/2023 PCP: Antonetta Rollene BRAVO, MD   Chief Complaint  Patient presents with   low hemoglobin   Level of care: Med-Surg  Brief Admission History:  68 y.o. female, with significant history of Atrial fib on apixaban , PAD  (s/p R BKA in 05/2022, prior lithotripsy and stenting of the left superficial femoral and popliteal artery in 06/2022 and ultimately L BKA in 09/2022), HTN, HLD, Type 2 DM, bilateral adrenal adenoma (followed by Endocrinology), chronic HFpEF, aortic stenosis, OSA, ESRD (MWF), history of TIA's, history of recurrent chest pain  (cath in 12/2018 showing normal coronary arteries) recent hospitalization for left hand I&D, MSSA infection on antibiotics IV Ancef  with HD - She was sent by nephrologist after HD for anemia and low hemoglobin, labs done at the HD center noted for hemoglobin of 6.3, most recent baseline was 8.4 on 8/25, he is on apixaban , sent to ED for further evaluation.  In ED hemoglobin noted to be 6.8, he was Hemoccult positive, but stools were normal in color in rectal exam the ED physician, she was ordered 1 unit PRBC, ED discussed with GI who recommended clear liquid diet, holding Eliquis , starting on Protonix  and to admit to hospitalist service.    Assessment and Plan:  Symptomatic anemia Anemia of chronic kidney disease Hemoccult positive stool - Patient sent from HD center by her nephrologist given anemia of 6.8 - transfused 1 unit PRBC. - apixaban /aspirin  restarted on 9/15  - renal/carb diet, IV Protonix  - GI team consultation and recommendations are appreciated  - colonoscopy with findings of right sided diverticulosis and 2 polyps removed, diverticulosis felt to be source of bleed.  - recheck H/H in AM    ESRD -Dialysis Monday Wednesday Friday scheduled -appreciate nephrology team consultation and recommendations -HD per nephrology    Afib  -Heart rate is controlled, continue  with Toprol -XL -Hold apixaban  due to above.-- restart on 9/15   Depression - resumed home meds   History of CVA - Continue statins, holding Eliquis     Chronic diastolic (congestive) heart failure -Start HD today, euvolemic, volume management with dialysis and diuretics as she makes some urine  -echo 06/15/2023, EF 55%.  No LVH,. Left ventricular diastolic parameters are consistent with Grade I diastolic dysfunction (impaired relaxation). Elevated left ventricular end-diastolic pressure.    HTN - Continue with metoprolol , amlodipine     Hyperlipidemia  - Continue statins   GAD (generalized anxiety disorder) - As needed benzodiazepines, will monitor due to confusion    PVD Bilateral BKA - aspirin  held temporarily due to GI bleeding, restarted on 9/15 per GI   Diabetes mellitus type II, insulin -dependent - Last A1c 6.7 - Semglee  on hold due to low BS, continue renal sensitive insulin  sliding scale  CBG (last 3)  Recent Labs    12/05/23 2124 12/06/23 0309 12/06/23 0735  GLUCAP 243* 190* 159*   Hypoglycemia - from poor oral intake - treated by RN with D50  - BS improved    Leukocytosis Dry gangrene of left hand - suspect leukocytosis secondary to her gangrenous left hand  - she is on antibiotics with each HD treatment per her surgical docs managing this - she will need an amputation soon  - she is followed closely by hand surgeon outpatient for this  DVT prophylaxis: This post bilateral BKA, so cannot apply SCDs, cannot do pharmacologic prophylaxis due to GI bleed  Code Status: Full  Family Communication: bedside update Disposition:  anticipate home with [facility]    Consultants:  GI Nephrology   Procedures:   Antimicrobials:    Subjective: No specific complaints, denies rectal bleeding     Objective: Vitals:   12/06/23 1300 12/06/23 1315 12/06/23 1345 12/06/23 1500  BP:  (!) 131/59 126/65 (!) 142/64  Pulse:  74 77 84  Resp: 17 (!) 9 (!) 9 18  Temp:   98.4 F  (36.9 C) 98 F (36.7 C)  TempSrc:   Oral Oral  SpO2:  100% 99% 98%  Weight:   73.7 kg   Height:        Intake/Output Summary (Last 24 hours) at 12/06/2023 1549 Last data filed at 12/06/2023 1315 Gross per 24 hour  Intake 360 ml  Output 100 ml  Net 260 ml   Filed Weights   12/04/23 0115 12/06/23 0935 12/06/23 1345  Weight: 70.1 kg 73.5 kg 73.7 kg   Examination:  General exam: Appears calm and comfortable  Respiratory system: Clear to auscultation. Respiratory effort normal. Cardiovascular system: normal S1 & S2 heard. No JVD, murmurs, rubs, gallops or clicks. No pedal edema. Gastrointestinal system: Abdomen is nondistended, soft and nontender. No organomegaly or masses felt. Normal bowel sounds heard. Central nervous system: Alert and oriented. No focal neurological deficits. 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  Skin: No rashes, lesions or ulcers. Psychiatry: Judgement and insight appear normal. Mood & affect appropriate.   Data Reviewed: I have personally reviewed following labs and imaging studies  CBC: Recent Labs  Lab 12/02/23 0505 12/03/23 0525 12/04/23 0832 12/05/23 0637 12/06/23 0324  WBC 7.1 10.2 7.4 7.2 15.5*  HGB 7.8* 8.2* 8.0* 8.3* 7.8*  HCT 26.3* 27.2* 27.7* 27.3* 25.1*  MCV 85.7 86.1 90.2 84.5 82.3  PLT 264 287 178 288 263    Basic Metabolic Panel: Recent Labs  Lab 12/02/23 0505 12/03/23 0525 12/04/23 0832 12/05/23 0446 12/06/23 0324  NA 139 132* 133* 125* 130*  K 4.6 5.0 3.8 4.1 4.3  CL 101 99 97* 90* 93*  CO2 27 24 25 23 24   GLUCOSE 138* 78 92 100* 178*  BUN 24* 29* 15 18 27*  CREATININE 2.94* 3.70* 2.61* 3.40* 4.53*  CALCIUM  7.9* 7.9* 7.7* 7.6* 7.7*  PHOS  --  4.5 3.2 4.1 4.8*    CBG: Recent Labs  Lab 12/05/23 1131 12/05/23 1617 12/05/23 2124 12/06/23 0309 12/06/23 0735  GLUCAP 101* 103* 243* 190* 159*   No results found for this or any previous visit (from the past 240 hours).   Radiology  Studies: DG CHEST PORT 1 VIEW Result Date: 12/06/2023 CLINICAL DATA:  Leukocytosis EXAM: PORTABLE CHEST 1 VIEW COMPARISON:  Radiograph 11/10/2023 FINDINGS: Stable large cardiac silhouette. There is increased patchy airspace disease in the LEFT upper lobe and LEFT lower lobe. RIGHT lung clear. No pneumothorax. No acute osseous abnormality. IMPRESSION: New airspace patchy density in the LEFT lung. Findings concerning for multifocal pneumonia. Electronically Signed   By: Jackquline Boxer M.D.   On: 12/06/2023 13:52   Scheduled Meds:  amLODipine   5 mg Oral Daily   apixaban   5 mg Oral BID   aspirin   162 mg Oral Daily   bisacodyl   10 mg Oral Once   busPIRone   7.5 mg Oral BID   calcitRIOL   1 mcg Oral Q M,W,F-HD   Chlorhexidine  Gluconate Cloth  6 each Topical Q0600   ezetimibe   10 mg Oral Daily   guaiFENesin   600 mg Oral BID   insulin   aspart  0-6 Units Subcutaneous TID WC   metoprolol  succinate  50 mg Oral Q T,Th,S,Su   multivitamin  1 tablet Oral QHS   pantoprazole   40 mg Oral Daily   polyethylene glycol  17 g Oral Daily   sertraline   75 mg Oral Daily   silver  sulfADIAZINE    Topical Q12H   simethicone   240 mg Oral Once   sucroferric oxyhydroxide  1,000 mg Oral TID WC   torsemide   100 mg Oral Q T,Th,S,Su   Continuous Infusions:   ceFAZolin  (ANCEF ) IV 2 g (12/06/23 1445)    LOS: 5 days   Time spent: 55 mins  Jr Milliron Vicci, MD How to contact the [facility] Attending or Consulting provider 7A - 7P or covering provider during after hours 7P -7A, for this patient?  Check the care team in [facility] and look for a) attending/consulting [facility] provider listed and b) the [facility] team listed Log into [email or web address] to find provider on call.  Locate the [facility] provider you are looking for under [facility] and page to a number that you can be directly reached. If you still have difficulty reaching the provider, please page the [facility] (Director on Call) for the Hospitalists listed on amion for  assistance.  12/06/2023, 3:49 PM

## 2023-12-06 NOTE — Plan of Care (Incomplete)

## 2023-12-06 NOTE — Progress Notes (Signed)
 Patient ID: Kathryn Beck, female   DOB: 06/11/1955, 68 y.o.   MRN: [id number] S: Feels tired O:BP (!) 142/64 (BP Location: Right Wrist)   Pulse 76   Temp 98.1 F (36.7 C) (Axillary)   Resp 18   Ht 5' 4 (1.626 m)   Wt 70.1 kg   SpO2 98%   BMI 26.53 kg/m   Intake/Output Summary (Last 24 hours) at 12/06/2023 0817 Last data filed at 12/05/2023 2135 Gross per 24 hour  Intake 120 ml  Output --  Net 120 ml   Intake/Output: I/O last 3 completed shifts: In: 360 [P.O.:360] Out: -   Intake/Output this shift:  No intake/output data recorded. Weight change:  Gen: NAD CVS:RRR Resp:CTa Abd: +BS, soft, NT/ND Ext: lue avg+T/B, s/p bilateral BKA's.  Recent Labs  Lab 12/01/23 1408 12/02/23 0505 12/03/23 0525 12/04/23 0832 12/05/23 0446 12/06/23 0324  NA 141 139 132* 133* 125* 130*  K 3.9 4.6 5.0 3.8 4.1 4.3  CL 99 101 99 97* 90* 93*  CO2 28 27 24 25 23 24   GLUCOSE 209* 138* 78 92 100* 178*  BUN 15 24* 29* 15 18 27*  CREATININE 2.15* 2.94* 3.70* 2.61* 3.40* 4.53*  ALBUMIN  3.0*  --  2.8* 2.7* 2.7* 2.4*  CALCIUM  8.6* 7.9* 7.9* 7.7* 7.6* 7.7*  PHOS  --   --  4.5 3.2 4.1 4.8*  AST 18  --   --   --   --   --   ALT 8  --   --   --   --   --    Liver Function Tests: Recent Labs  Lab 12/01/23 1408 12/03/23 0525 12/04/23 0832 12/05/23 0446 12/06/23 0324  AST 18  --   --   --   --   ALT 8  --   --   --   --   ALKPHOS 81  --   --   --   --   BILITOT 0.4  --   --   --   --   PROT 6.4*  --   --   --   --   ALBUMIN  3.0*   < > 2.7* 2.7* 2.4*   < > = values in this interval not displayed.   No results for input(s): LIPASE, AMYLASE in the last 168 hours. No results for input(s): AMMONIA in the last 168 hours. CBC: Recent Labs  Lab 12/02/23 0505 12/03/23 0525 12/04/23 0832 12/05/23 0637 12/06/23 0324  WBC 7.1 10.2 7.4 7.2 15.5*  HGB 7.8* 8.2* 8.0* 8.3* 7.8*  HCT 26.3* 27.2* 27.7* 27.3* 25.1*  MCV 85.7 86.1 90.2 84.5 82.3  PLT 264 287 178 288 263   Cardiac  Enzymes: No results for input(s): CKTOTAL, CKMB, CKMBINDEX, TROPONINI in the last 168 hours. CBG: Recent Labs  Lab 12/05/23 1131 12/05/23 1617 12/05/23 2124 12/06/23 0309 12/06/23 0735  GLUCAP 101* 103* 243* 190* 159*    Iron Studies: No results for input(s): IRON, TIBC, TRANSFERRIN, FERRITIN in the last 72 hours. Studies/Results: No results found.  amLODipine   5 mg Oral Daily   apixaban   5 mg Oral BID   aspirin   162 mg Oral Daily   bisacodyl   10 mg Oral Once   busPIRone   7.5 mg Oral BID   calcitRIOL   1 mcg Oral Q M,W,F-HD   Chlorhexidine  Gluconate Cloth  6 each Topical Q0600   ezetimibe   10 mg Oral Daily   guaiFENesin   600 mg Oral BID   insulin   aspart  0-6 Units Subcutaneous TID WC   metoprolol  succinate  50 mg Oral Q T,Th,S,Su   multivitamin  1 tablet Oral QHS   pantoprazole   40 mg Oral Daily   polyethylene glycol  17 g Oral Daily   sertraline   75 mg Oral Daily   silver  sulfADIAZINE    Topical Q12H   simethicone   240 mg Oral Once   sucroferric oxyhydroxide  1,000 mg Oral TID WC   torsemide   100 mg Oral Q T,Th,S,Su    BMET    Component Value Date/Time   NA 130 (L) 12/06/2023 0324   NA 142 07/07/2022 1140   K 4.3 12/06/2023 0324   CL 93 (L) 12/06/2023 0324   CO2 24 12/06/2023 0324   GLUCOSE 178 (H) 12/06/2023 0324   BUN 27 (H) 12/06/2023 0324   BUN 19 07/07/2022 1140   CREATININE 4.53 (H) 12/06/2023 0324   CREATININE 3.74 (H) 08/23/2017 1407   CALCIUM  7.7 (L) 12/06/2023 0324   CALCIUM  9.4 10/15/2017 1258   GFRNONAA 10 (L) 12/06/2023 0324   GFRNONAA 12 (L) 08/23/2017 1407   GFRAA 8 (L) 07/19/2019 1210   GFRAA 14 (L) 08/23/2017 1407   CBC    Component Value Date/Time   WBC 15.5 (H) 12/06/2023 0324   RBC 3.05 (L) 12/06/2023 0324   HGB 7.8 (L) 12/06/2023 0324   HGB 10.9 (L) 12/15/2022 1340   HCT 25.1 (L) 12/06/2023 0324   HCT 37.0 12/15/2022 1340   PLT 263 12/06/2023 0324   PLT 354 12/15/2022 1340   MCV 82.3 12/06/2023 0324   MCV 81  12/15/2022 1340   MCH 25.6 (L) 12/06/2023 0324   MCHC 31.1 12/06/2023 0324   RDW 18.3 (H) 12/06/2023 0324   RDW 18.4 (H) 12/15/2022 1340   LYMPHSABS 1.0 11/03/2023 0357   LYMPHSABS 1.0 12/15/2022 1340   MONOABS 1.0 11/03/2023 0357   EOSABS 0.1 11/03/2023 0357   EOSABS 0.0 12/15/2022 1340   BASOSABS 0.1 11/03/2023 0357   BASOSABS 0.0 12/15/2022 1340     Outpatient HD Rx:  MWF [facility] 3 hours and 30 minutes  EDW 70.5 kg (she gets to this usually) 2k/ 2.5 ca bath BF 400 DF 500 Heparin  1000 units loading and then 1000 units an hour Meds: mircera 60 mcg every 2 weeks and last given on 11/29/23 Calcitriol  0.75 mcg three times a week; cincalcet 90 mg three weeks - She was recently re-ordered cefazolin  2 grams with HD from 9/9 until 01/10/24.  She got the cefazolin  on 12/01/23       Assessment/Plan:    ESRD on HD - HD per MWF schedule - outpatient info as above    Anemia with acute blood loss - compounded by anemia of chronic disease - on ESA outpatient with HD and recently dosed - GI performed endoscopy on 9/12 which was unrevealing. Colonoscopy performed on 12/05/23 which showed non-bleeding internal hemorrhoids, diverticulosis, 2 polyps resected.  Likely bleed from right sided diverticular disease.  - Hgb slowly trending down. Transfuse prn.       HTN - continue home meds for now  - optimize volume status with HD    Left hand osteomyelitis - she is on cefazolin  which was recently reinitiated - per team's exam concern is that this is gangrenous - per primary team   Chronic diastolic CHF - optimize volume with HD   Metabolic bone disease  - Continue binders when taking PO  - pausing sensipar  given her hypocalcemia - will need  to assess outpatient at HD unit per trends   Disposition - per primary team   Fairy RONAL Sellar, MD [facility]

## 2023-12-06 NOTE — Progress Notes (Signed)
 Gastroenterology Progress Note   Patient ID: Kathryn Beck; [id number]; 20-Aug-1955    Subjective   Tmax 99.5 overnight. Denies chills. No abdominal pain, N/V, overt GI bleeding. Denies cough or chest pain. Feels tired. Currently in dialysis session.    Objective   Vital signs in last 24 hours Temp:  [98.1 F (36.7 C)-99.5 F (37.5 C)] 98.1 F (36.7 C) (09/15 0314) Pulse Rate:  [72-85] 76 (09/15 0314) Resp:  [14-20] 18 (09/15 0314) BP: (115-166)/(52-76) 142/64 (09/15 0314) SpO2:  [94 %-98 %] 98 % (09/15 0314) Last BM Date : 12/05/23  Physical Exam General:   Alert and oriented, pleasant Head:  Normocephalic and atraumatic. Abdomen:  Bowel sounds present, soft, non-tender, non-distended.  Msk:  bilateral BKA Neurologic:  Alert and  oriented x4  Intake/Output from previous day: 09/14 0701 - 09/15 0700 In: 120 [P.O.:120] Out: -  Intake/Output this shift: No intake/output data recorded.  Lab Results  Recent Labs    12/04/23 0832 12/05/23 0637 12/06/23 0324  WBC 7.4 7.2 15.5*  HGB 8.0* 8.3* 7.8*  HCT 27.7* 27.3* 25.1*  PLT 178 288 263   BMET Recent Labs    12/04/23 0832 12/05/23 0446 12/06/23 0324  NA 133* 125* 130*  K 3.8 4.1 4.3  CL 97* 90* 93*  CO2 25 23 24   GLUCOSE 92 100* 178*  BUN 15 18 27*  CREATININE 2.61* 3.40* 4.53*  CALCIUM  7.7* 7.6* 7.7*   LFT Recent Labs    12/04/23 0832 12/05/23 0446 12/06/23 0324  ALBUMIN  2.7* 2.7* 2.4*     Studies/Results CT Head Wo Contrast Result Date: 11/10/2023 EXAM: CT HEAD WITHOUT CONTRAST 11/10/2023 10:52:18 AM TECHNIQUE: CT of the head was performed without the administration of intravenous contrast. Automated exposure control, iterative reconstruction, and/or weight based adjustment of the mA/kV was utilized to reduce the radiation dose to as low as reasonably achievable. COMPARISON: MRI of the head dated 12/17/2022. CLINICAL HISTORY: Mental status change, unknown cause. Pt arrived via [facility] from  dialysis, from home, pt was A\T\O this morning 0645 am before Tx and become lethargic and zoned out around 0800 this morning and was slow to respond, pt states she feels a little sleepy but able to answer EMSs questions. FINDINGS: BRAIN AND VENTRICLES: No acute hemorrhage. Gray-white differentiation is preserved. No hydrocephalus. No extra-axial collection. No mass effect or midline shift. Chronic encephalomalacia changes present within the left parietal and occipital lobes. Mild periventricular white matter disease. ORBITS: No acute abnormality. SINUSES: No acute abnormality. SOFT TISSUES AND SKULL: No acute soft tissue abnormality. No skull fracture. Calcifications within the carotid siphons. IMPRESSION: 1. No acute intracranial abnormality. 2. Chronic encephalomalacia changes in the left parietal and occipital lobes. 3. Mild periventricular white matter disease. 4. Calcifications within the carotid siphons. Electronically signed by: Evalene Coho MD 11/10/2023 10:58 AM EDT RP Workstation: [id number]   DG Chest Portable 1 View Result Date: 11/10/2023 CLINICAL DATA:  Confusion.  Lethargy. EXAM: PORTABLE CHEST 1 VIEW COMPARISON:  10/27/2023. FINDINGS: Stable cardiomegaly. Aortic atherosclerosis. Similar streaky left basilar opacities, favored to reflect atelectasis. The right lung is clear. No sizable pleural effusion or pneumothorax. Degenerative changes of the right glenohumeral joint. No acute osseous abnormality. IMPRESSION: 1. Similar streaky left basilar opacities, favored to reflect atelectasis. 2. Stable cardiomegaly. Electronically Signed   By: Harrietta Sherry M.D.   On: 11/10/2023 10:10    Assessment  68 y.o. female with multimorbidities and on dialysis, presenting this admission with acute on chronic anemia with  heme positive stool on Eliquis . GI consulted for further management.   Acute on chronic anemia with heme positive stool on Eliquis : Hgb 6.8 on admission, previously 8.9 several  weeks ago. Baseline appears to be around 9/10 historically, receiving 1 unit PRBCs with improvement to 7.8. Endoscopic evaluation with colonoscopy showing hemorrhoids, polyps, unable to rule out right-sided diverticula bleed vs benign hemorrhoidal in setting of anticoagulation. EGD with medium amount of food debris in stomach. Currently, Hgb is 7.8 this morning but without overt GI bleeding. Upon review of past 3-4 days, Hgb has been fluctuating in this range from 7.8 to 8.3. Unable to rule out small bowel source but recommend evaluation as outpatient for possible capsule.   Leukocytosis: WBC count 15.5, up from 7.2. Denies cough, shortness of breath, abdominal pain. As she did have debris in stomach, unable to rule out aspirating. Tmax 99.5 overnight. Discussed with hospitalist.   Dypshagia: no concerns today.   Constipation: continue Miralax  daily.     Plan / Recommendations  Continue PPI daily Monitor H/H. Eliquis  resumed today Miralax  daily If overt GI bleeding, recommend CTA May need outpatient capsule if occult GI bleed as at risk for small bowel AVMs Trend leukocytosis: discussed with hospitalist as well. Further management per hospitalist Follow-up on pending pathology from colonoscopy    LOS: 5 days    12/06/2023, 9:10 AM  Therisa MICAEL Stager, PhD, [facility] Gastroenterology

## 2023-12-07 ENCOUNTER — Other Ambulatory Visit: Payer: Self-pay | Admitting: *Deleted

## 2023-12-07 ENCOUNTER — Ambulatory Visit: Admitting: Family Medicine

## 2023-12-07 ENCOUNTER — Telehealth: Payer: Self-pay | Admitting: Gastroenterology

## 2023-12-07 DIAGNOSIS — I4891 Unspecified atrial fibrillation: Secondary | ICD-10-CM | POA: Diagnosis not present

## 2023-12-07 DIAGNOSIS — D649 Anemia, unspecified: Secondary | ICD-10-CM

## 2023-12-07 DIAGNOSIS — K922 Gastrointestinal hemorrhage, unspecified: Secondary | ICD-10-CM | POA: Diagnosis not present

## 2023-12-07 DIAGNOSIS — I259 Chronic ischemic heart disease, unspecified: Secondary | ICD-10-CM | POA: Diagnosis not present

## 2023-12-07 DIAGNOSIS — I96 Gangrene, not elsewhere classified: Secondary | ICD-10-CM | POA: Diagnosis not present

## 2023-12-07 LAB — CBC
HCT: 25.8 % — ABNORMAL LOW (ref 36.0–46.0)
Hemoglobin: 7.9 g/dL — ABNORMAL LOW (ref 12.0–15.0)
MCH: 25.4 pg — ABNORMAL LOW (ref 26.0–34.0)
MCHC: 30.6 g/dL (ref 30.0–36.0)
MCV: 83 fL (ref 80.0–100.0)
Platelets: 280 K/uL (ref 150–400)
RBC: 3.11 MIL/uL — ABNORMAL LOW (ref 3.87–5.11)
RDW: 18.7 % — ABNORMAL HIGH (ref 11.5–15.5)
WBC: 9.4 K/uL (ref 4.0–10.5)
nRBC: 0.3 % — ABNORMAL HIGH (ref 0.0–0.2)

## 2023-12-07 LAB — GLUCOSE, CAPILLARY
Glucose-Capillary: 238 mg/dL — ABNORMAL HIGH (ref 70–99)
Glucose-Capillary: 204 mg/dL — ABNORMAL HIGH (ref 70–99)

## 2023-12-07 LAB — RENAL FUNCTION PANEL
Albumin: 2.4 g/dL — ABNORMAL LOW (ref 3.5–5.0)
Anion gap: 8 (ref 5–15)
BUN: 27 mg/dL — ABNORMAL HIGH (ref 8–23)
CO2: 27 mmol/L (ref 22–32)
Calcium: 8.1 mg/dL — ABNORMAL LOW (ref 8.9–10.3)
Chloride: 100 mmol/L (ref 98–111)
Creatinine, Ser: 3.7 mg/dL — ABNORMAL HIGH (ref 0.44–1.00)
GFR, Estimated: 13 mL/min — ABNORMAL LOW (ref >60)
Glucose, Bld: 232 mg/dL — ABNORMAL HIGH (ref 70–99)
Phosphorus: 3.8 mg/dL (ref 2.5–4.6)
Potassium: 4.1 mmol/L (ref 3.5–5.1)
Sodium: 135 mmol/L (ref 135–145)

## 2023-12-07 LAB — SURGICAL PATHOLOGY

## 2023-12-07 LAB — PROCALCITONIN: Procalcitonin: 31.63 ng/mL

## 2023-12-07 MED ORDER — PANTOPRAZOLE SODIUM 40 MG PO TBEC: 40.0000 mg | DELAYED_RELEASE_TABLET | Freq: Every day | ORAL | 1 refills | Status: DC

## 2023-12-07 MED ORDER — CINACALCET HCL 30 MG PO TABS: 90.0000 mg | ORAL_TABLET | ORAL | Status: DC

## 2023-12-07 NOTE — Progress Notes (Signed)
 D/c orders noted. Contacted out-pt HD unit ([facility]) to inform of pt d/c and anticipated arrival back on Wednesday. Clinic reminded of cefazolin  2g w Hd, end date provided. Faxed over d/c summary and last nephrology progress note. No further support needed at this time.   Lavanda Sandie Swayze Dialysis Navigator [phone]

## 2023-12-07 NOTE — Progress Notes (Signed)
 Patient ID: Kathryn Beck, female   DOB: [birth year], 68 y.o.   MRN: [id number] S: No new complaints.  Had issue with machine yesterday but no blood loss. O:BP 138/67 (BP Location: Left Arm)   Pulse 75   Temp 98.2 F (36.8 C) (Oral)   Resp 18   Ht 5' 4 (1.626 m)   Wt 72.7 kg   SpO2 98%   BMI 27.51 kg/m   Intake/Output Summary (Last 24 hours) at 12/07/2023 0823 Last data filed at 12/06/2023 2000 Gross per 24 hour  Intake 460 ml  Output 100 ml  Net 360 ml   Intake/Output: I/O last 3 completed shifts: In: 580 [P.O.:480; IV Piggyback:100] Out: 100 [Other:100]  Intake/Output this shift:  No intake/output data recorded. Weight change:  Gen: NAD CVS: RRR Resp:CTA  Abd:+BS, soft, NT/ND Ext: s/p bilateral BKA's, no edema, LUE AVF +T/B  Recent Labs  Lab 12/01/23 1408 12/02/23 0505 12/03/23 0525 12/04/23 0832 12/05/23 0446 12/06/23 0324 12/07/23 0424  NA 141 139 132* 133* 125* 130* 135  K 3.9 4.6 5.0 3.8 4.1 4.3 4.1  CL 99 101 99 97* 90* 93* 100  CO2 28 27 24 25 23 24 27   GLUCOSE 209* 138* 78 92 100* 178* 232*  BUN 15 24* 29* 15 18 27* 27*  CREATININE 2.15* 2.94* 3.70* 2.61* 3.40* 4.53* 3.70*  ALBUMIN  3.0*  --  2.8* 2.7* 2.7* 2.4* 2.4*  CALCIUM  8.6* 7.9* 7.9* 7.7* 7.6* 7.7* 8.1*  PHOS  --   --  4.5 3.2 4.1 4.8* 3.8  AST 18  --   --   --   --   --   --   ALT 8  --   --   --   --   --   --    Liver Function Tests: Recent Labs  Lab 12/01/23 1408 12/03/23 0525 12/05/23 0446 12/06/23 0324 12/07/23 0424  AST 18  --   --   --   --   ALT 8  --   --   --   --   ALKPHOS 81  --   --   --   --   BILITOT 0.4  --   --   --   --   PROT 6.4*  --   --   --   --   ALBUMIN  3.0*   < > 2.7* 2.4* 2.4*   < > = values in this interval not displayed.   No results for input(s): LIPASE, AMYLASE in the last 168 hours. No results for input(s): AMMONIA in the last 168 hours. CBC: Recent Labs  Lab 12/03/23 0525 12/04/23 0832 12/05/23 0637 12/06/23 0324 12/07/23 0424  WBC  10.2 7.4 7.2 15.5* 9.4  HGB 8.2* 8.0* 8.3* 7.8* 7.9*  HCT 27.2* 27.7* 27.3* 25.1* 25.8*  MCV 86.1 90.2 84.5 82.3 83.0  PLT 287 178 288 263 280   Cardiac Enzymes: No results for input(s): CKTOTAL, CKMB, CKMBINDEX, TROPONINI in the last 168 hours. CBG: Recent Labs  Lab 12/06/23 0735 12/06/23 1637 12/06/23 2315 12/07/23 0243 12/07/23 0746  GLUCAP 159* 151* 240* 238* 204*    Iron Studies: No results for input(s): IRON, TIBC, TRANSFERRIN, FERRITIN in the last 72 hours. Studies/Results: DG CHEST PORT 1 VIEW Result Date: 12/06/2023 CLINICAL DATA:  Leukocytosis EXAM: PORTABLE CHEST 1 VIEW COMPARISON:  Radiograph 11/10/2023 FINDINGS: Stable large cardiac silhouette. There is increased patchy airspace disease in the LEFT upper lobe and LEFT lower lobe. RIGHT lung clear. No  pneumothorax. No acute osseous abnormality. IMPRESSION: New airspace patchy density in the LEFT lung. Findings concerning for multifocal pneumonia. Electronically Signed   By: Jackquline Boxer M.D.   On: 12/06/2023 13:52    amLODipine   5 mg Oral Daily   apixaban   5 mg Oral BID   aspirin   162 mg Oral Daily   bisacodyl   10 mg Oral Once   busPIRone   7.5 mg Oral BID   calcitRIOL   1 mcg Oral Q M,W,F-HD   Chlorhexidine  Gluconate Cloth  6 each Topical Q0600   ezetimibe   10 mg Oral Daily   guaiFENesin   600 mg Oral BID   insulin  aspart  0-6 Units Subcutaneous TID WC   metoprolol  succinate  50 mg Oral Q T,Th,S,Su   multivitamin  1 tablet Oral QHS   pantoprazole   40 mg Oral Daily   polyethylene glycol  17 g Oral Daily   sertraline   75 mg Oral Daily   silver  sulfADIAZINE    Topical Q12H   simethicone   240 mg Oral Once   sucroferric oxyhydroxide  1,000 mg Oral TID WC   torsemide   100 mg Oral Q T,Th,S,Su    BMET    Component Value Date/Time   NA 135 12/07/2023 0424   NA 142 07/07/2022 1140   K 4.1 12/07/2023 0424   CL 100 12/07/2023 0424   CO2 27 12/07/2023 0424   GLUCOSE 232 (H) 12/07/2023 0424   BUN  27 (H) 12/07/2023 0424   BUN 19 07/07/2022 1140   CREATININE 3.70 (H) 12/07/2023 0424   CREATININE 3.74 (H) 08/23/2017 1407   CALCIUM  8.1 (L) 12/07/2023 0424   CALCIUM  9.4 10/15/2017 1258   GFRNONAA 13 (L) 12/07/2023 0424   GFRNONAA 12 (L) 08/23/2017 1407   GFRAA 8 (L) 07/19/2019 1210   GFRAA 14 (L) 08/23/2017 1407   CBC    Component Value Date/Time   WBC 9.4 12/07/2023 0424   RBC 3.11 (L) 12/07/2023 0424   HGB 7.9 (L) 12/07/2023 0424   HGB 10.9 (L) 12/15/2022 1340   HCT 25.8 (L) 12/07/2023 0424   HCT 37.0 12/15/2022 1340   PLT 280 12/07/2023 0424   PLT 354 12/15/2022 1340   MCV 83.0 12/07/2023 0424   MCV 81 12/15/2022 1340   MCH 25.4 (L) 12/07/2023 0424   MCHC 30.6 12/07/2023 0424   RDW 18.7 (H) 12/07/2023 0424   RDW 18.4 (H) 12/15/2022 1340   LYMPHSABS 1.0 11/03/2023 0357   LYMPHSABS 1.0 12/15/2022 1340   MONOABS 1.0 11/03/2023 0357   EOSABS 0.1 11/03/2023 0357   EOSABS 0.0 12/15/2022 1340   BASOSABS 0.1 11/03/2023 0357   BASOSABS 0.0 12/15/2022 1340   Outpatient HD Rx:  MWF [facility] 3 hours and 30 minutes  EDW 70.5 kg (she gets to this usually) 2k/ 2.5 ca bath BF 400 DF 500 Heparin  1000 units loading and then 1000 units an hour Meds: mircera 60 mcg every 2 weeks and last given on 11/29/23 Calcitriol  0.75 mcg three times a week; cincalcet 90 mg three weeks - She was recently re-ordered cefazolin  2 grams with HD from 9/9 until 01/10/24.  She got the cefazolin  on 12/01/23  Assessment/Plan:    ESRD on HD - HD per MWF schedule - outpatient info as above    Anemia with acute blood loss - compounded by anemia of chronic disease - on ESA outpatient with HD and recently dosed - GI performed endoscopy on 9/12 which was unrevealing. Colonoscopy performed on 12/05/23 which showed non-bleeding internal  hemorrhoids, diverticulosis, 2 polyps resected.  Likely bleed from right sided diverticular disease.  - Hgb slowly trending down. Transfuse prn.       HTN -  continue home meds for now  - optimize volume status with HD    Left hand osteomyelitis - she is on cefazolin  which was recently reinitiated - per team's exam concern is that this is gangrenous - per primary team   Chronic diastolic CHF - optimize volume with HD   Metabolic bone disease  - Continue binders when taking PO  - pausing sensipar  given her hypocalcemia - will need to assess outpatient at HD unit per trends   Disposition - per primary team, hopeful discharge today   Fairy RONAL Sellar, MD [facility]

## 2023-12-07 NOTE — Telephone Encounter (Signed)
 Jhonny -please make hospital follow-up for patient in 3-4 weeks with AB or LSL or Dr. Cindie who seen her while inpatient.  Charmaine S/Tammy/Dena -Sari please place orders for CBC to be done in 1 week. Dx: anemia.   Charmaine Melia, MSN, APRN, FNP-BC, AGACNP-BC [facility] Gastroenterology at [facility]

## 2023-12-07 NOTE — Care Management Important Message (Signed)
 Important Message  Patient Details  Name: Kathryn Beck MRN: [id number] Date of Birth: Apr 23, 1955   Important Message Given:  Yes - Medicare IM     [name] 12/07/2023, 11:21 AM

## 2023-12-07 NOTE — Telephone Encounter (Signed)
 LMOM for pt to call office. Also, sent pt a MyChart message. Labs entered Epic.

## 2023-12-07 NOTE — Discharge Summary (Signed)
 Physician Discharge Summary  Kathryn Beck FMW:[id number] DOB: 1955/05/15 DOA: 12/01/2023  PCP: Antonetta Rollene BRAVO, MD  Admit date: 12/01/2023 Discharge date: 12/07/2023  Admitted From:  HOME with HH Disposition: HOME with [facility]   Recommendations for Outpatient Follow-up:  Follow up with PCP in 1 weeks Resume regular outpatient HD schedule tomorrow Follow up with hand surgeon orthopedist as scheduled Continue IV cefazolin  treatments with HD   Home Health: resumption orders  Discharge Condition: STABLE   CODE STATUS: FULL DIET:  renal carb modified with fluid restriction   Brief Hospitalization Summary: Please see all hospital notes, images, labs for full details of the hospitalization. Admission provider HPI:  68 y.o. female, with significant history of Atrial fib on apixaban , PAD  (s/p R BKA in 05/2022, prior lithotripsy and stenting of the left superficial femoral and popliteal artery in 06/2022 and ultimately L BKA in 09/2022), HTN, HLD, Type 2 DM, bilateral adrenal adenoma (followed by Endocrinology), chronic HFpEF, aortic stenosis, OSA, ESRD (MWF), history of TIA's, history of recurrent chest pain  (cath in 12/2018 showing normal coronary arteries) recent hospitalization for left hand I&D, MSSA infection on antibiotics IV Ancef  with HD - She was sent by nephrologist after HD for anemia and low hemoglobin, labs done at the HD center noted for hemoglobin of 6.3, most recent baseline was 8.4 on 8/25, he is on apixaban , sent to ED for further evaluation.  In ED hemoglobin noted to be 6.8, he was Hemoccult positive, but stools were normal in color in rectal exam the ED physician, she was ordered 1 unit PRBC, ED discussed with GI who recommended clear liquid diet, holding Eliquis , starting on Protonix  and to admit to hospitalist service.   Hospital Course by listed problems addressed  Symptomatic anemia Anemia of chronic kidney disease Hemoccult positive stool - Patient sent from HD center  by her nephrologist given anemia of 6.8 - transfused 1 unit PRBC. - apixaban /aspirin  restarted on 9/15  - renal/carb diet, IV Protonix  - GI team consultation and recommendations are appreciated  - colonoscopy with findings of right sided diverticulosis and 2 polyps removed, diverticulosis felt to be source of bleed.  - recheck H/H is stable Hg at 7.9  - ok to discharge home today   ESRD -Dialysis Monday Wednesday Friday scheduled -appreciate nephrology team consultation and recommendations -HD per nephrology -resume regular outpatient HD treatment tomorrow     Afib  -Heart rate is controlled, continue with Toprol -XL -Hold apixaban  due to above -- restarted on 9/15   Depression - resumed home meds   History of CVA - Continue statins, holding Eliquis     Chronic diastolic (congestive) heart failure -Start HD today, euvolemic, volume management with dialysis and diuretics as she makes some urine  -echo 06/15/2023, EF 55%.  No LVH,. Left ventricular diastolic parameters are consistent with Grade I diastolic dysfunction (impaired relaxation). Elevated left ventricular end-diastolic pressure.    HTN - resumed home meds     Hyperlipidemia  - Continue statins   GAD (generalized anxiety disorder) - As needed benzodiazepines, will monitor due to confusion    PVD Bilateral BKA - aspirin  held temporarily due to GI bleeding, restarted on 9/15 per GI   Diabetes mellitus type II, insulin -dependent - Last A1c 6.7 - resume home management   Hypoglycemia - from poor oral intake - treated by RN with D50  - BS improved    Leukocytosis -- RESOLVED  Dry gangrene of left hand - suspect leukocytosis secondary to her gangrenous left  hand  - she is on antibiotics with each HD treatment per her surgical docs managing this - she will need an amputation soon  - she is followed closely by hand surgeon outpatient for this and encouraged to follow up  Discharge Diagnoses:  Principal  Problem:   GI bleed Active Problems:   OSA (obstructive sleep apnea)   Ischemic heart disease   ESRD on hemodialysis (HCC)   Uncontrolled type 2 diabetes mellitus with hyperglycemia (HCC)   Afib (HCC)   Gangrene (HCC)   Discharge Instructions:  Allergies as of 12/07/2023       Reactions   Ace Inhibitors Anaphylaxis, Swelling   Penicillins Itching, Swelling   Has tolerated cefazolin  on multiple occasions    Statins Other (See Comments)   Elevated LFT's   Albuterol  Swelling        Medication List     TAKE these medications    acetaminophen  500 MG tablet Commonly known as: TYLENOL  Take 1,000 mg by mouth every 6 (six) hours as needed for mild pain (pain score 1-3).   amLODipine  10 MG tablet Commonly known as: NORVASC  Take 0.5 tablets (5 mg total) by mouth daily.   apixaban  5 MG Tabs tablet Commonly known as: ELIQUIS  Take 1 tablet (5 mg total) by mouth 2 (two) times daily. For Afib   aspirin  81 MG chewable tablet Chew 2 tablets (162 mg total) by mouth daily.   busPIRone  7.5 MG tablet Commonly known as: BUSPAR  Take 1 tablet (7.5 mg total) by mouth 2 (two) times daily.   calcitRIOL  0.5 MCG capsule Commonly known as: ROCALTROL  Take 2 capsules (1 mcg total) by mouth every Monday, Wednesday, and Friday with hemodialysis.   CEFAZOLIN  SODIUM IV Inject into the vein every Monday, Wednesday, and Friday with hemodialysis.   cinacalcet  30 MG tablet Commonly known as: SENSIPAR  Take 3 tablets (90 mg total) by mouth every Monday, Wednesday, and Friday. Start taking on: December 08, 2023   Darbepoetin Alfa  40 MCG/0.4ML Sosy injection Commonly known as: ARANESP  Inject 0.4 mLs (40 mcg total) into the skin every Friday at 6 PM. What changed: how to take this   ezetimibe  10 MG tablet Commonly known as: ZETIA  Take 1 tablet (10 mg total) by mouth daily.   insulin  aspart 100 UNIT/ML injection Commonly known as: novoLOG  Inject 0-6 Units into the skin 3 (three) times daily  with meals. CBG 70 - 120: 0 units  CBG 121 - 150: 0 units  CBG 151 - 200: 1 unit  CBG 201-250: 2 units  CBG 251-300: 3 units  CBG 301-350: 4 units  CBG 351-400: 5 units  CBG > 400: Give 10 units and call MD   metoprolol  succinate 50 MG 24 hr tablet Commonly known as: TOPROL -XL TAKE (1) TABLET BY MOUTH DAILY WITH FOOD *TAKE AFTER DIALYSIS* What changed:  how much to take how to take this when to take this additional instructions   ondansetron  4 MG tablet Commonly known as: ZOFRAN  Take 4 mg by mouth every 8 (eight) hours as needed for nausea or vomiting.   pantoprazole  40 MG tablet Commonly known as: PROTONIX  Take 1 tablet (40 mg total) by mouth daily. Start taking on: December 08, 2023   Rena-Vite Rx 1 MG Tabs Take 1 tablet by mouth daily.   sertraline  50 MG tablet Commonly known as: ZOLOFT  Take one and a half tablets once daily What changed:  how much to take how to take this when to take this   torsemide   100 MG tablet Commonly known as: DEMADEX  Take 1 tablet (100 mg total) by mouth every Tuesday, Thursday, Saturday, and Sunday. What changed: additional instructions   Toujeo  Max SoloStar 300 UNIT/ML Solostar Pen Generic drug: insulin  glargine (2 Unit Dial ) Inject 30 Units into the skin daily. May need to slowly increase the dose depending upon your blood sugar, follow-up with PCP   [name]  500 MG chewable tablet Generic drug: sucroferric oxyhydroxide Chew 1-2 tablets (500-1,000 mg total) by mouth See admin instructions. Take 1000mg  (2 tablets) by mouth with meals and 500mg  (1 tablet) with snacks        Follow-up Information     [facility] Follow up.   Specialty: Home Health Services Why: Home Health will call to schedule you next home visit. Contact information: [address] [place] [address] [phone]                Allergies  Allergen Reactions   Ace Inhibitors Anaphylaxis and Swelling   Penicillins  Itching and Swelling    Has tolerated cefazolin  on multiple occasions    Statins Other (See Comments)    Elevated LFT's   Albuterol  Swelling   Allergies as of 12/07/2023       Reactions   Ace Inhibitors Anaphylaxis, Swelling   Penicillins Itching, Swelling   Has tolerated cefazolin  on multiple occasions    Statins Other (See Comments)   Elevated LFT's   Albuterol  Swelling        Medication List     TAKE these medications    acetaminophen  500 MG tablet Commonly known as: TYLENOL  Take 1,000 mg by mouth every 6 (six) hours as needed for mild pain (pain score 1-3).   amLODipine  10 MG tablet Commonly known as: NORVASC  Take 0.5 tablets (5 mg total) by mouth daily.   apixaban  5 MG Tabs tablet Commonly known as: ELIQUIS  Take 1 tablet (5 mg total) by mouth 2 (two) times daily. For Afib   aspirin  81 MG chewable tablet Chew 2 tablets (162 mg total) by mouth daily.   busPIRone  7.5 MG tablet Commonly known as: BUSPAR  Take 1 tablet (7.5 mg total) by mouth 2 (two) times daily.   calcitRIOL  0.5 MCG capsule Commonly known as: ROCALTROL  Take 2 capsules (1 mcg total) by mouth every Monday, Wednesday, and Friday with hemodialysis.   CEFAZOLIN  SODIUM IV Inject into the vein every Monday, Wednesday, and Friday with hemodialysis.   cinacalcet  30 MG tablet Commonly known as: SENSIPAR  Take 3 tablets (90 mg total) by mouth every Monday, Wednesday, and Friday. Start taking on: December 08, 2023   Darbepoetin Alfa  40 MCG/0.4ML Sosy injection Commonly known as: ARANESP  Inject 0.4 mLs (40 mcg total) into the skin every Friday at 6 PM. What changed: how to take this   ezetimibe  10 MG tablet Commonly known as: ZETIA  Take 1 tablet (10 mg total) by mouth daily.   insulin  aspart 100 UNIT/ML injection Commonly known as: novoLOG  Inject 0-6 Units into the skin 3 (three) times daily with meals. CBG 70 - 120: 0 units  CBG 121 - 150: 0 units  CBG 151 - 200: 1 unit  CBG 201-250: 2 units   CBG 251-300: 3 units  CBG 301-350: 4 units  CBG 351-400: 5 units  CBG > 400: Give 10 units and call MD   metoprolol  succinate 50 MG 24 hr tablet Commonly known as: TOPROL -XL TAKE (1) TABLET BY MOUTH DAILY WITH FOOD *TAKE AFTER DIALYSIS* What changed:  how  much to take how to take this when to take this additional instructions   ondansetron  4 MG tablet Commonly known as: ZOFRAN  Take 4 mg by mouth every 8 (eight) hours as needed for nausea or vomiting.   pantoprazole  40 MG tablet Commonly known as: PROTONIX  Take 1 tablet (40 mg total) by mouth daily. Start taking on: December 08, 2023   Rena-Vite Rx 1 MG Tabs Take 1 tablet by mouth daily.   sertraline  50 MG tablet Commonly known as: ZOLOFT  Take one and a half tablets once daily What changed:  how much to take how to take this when to take this   torsemide  100 MG tablet Commonly known as: DEMADEX  Take 1 tablet (100 mg total) by mouth every Tuesday, Thursday, Saturday, and Sunday. What changed: additional instructions   Toujeo  Max SoloStar 300 UNIT/ML Solostar Pen Generic drug: insulin  glargine (2 Unit Dial ) Inject 30 Units into the skin daily. May need to slowly increase the dose depending upon your blood sugar, follow-up with PCP   [name]  500 MG chewable tablet Generic drug: sucroferric oxyhydroxide Chew 1-2 tablets (500-1,000 mg total) by mouth See admin instructions. Take 1000mg  (2 tablets) by mouth with meals and 500mg  (1 tablet) with snacks        Procedures/Studies: DG CHEST PORT 1 VIEW Result Date: 12/06/2023 CLINICAL DATA:  Leukocytosis EXAM: PORTABLE CHEST 1 VIEW COMPARISON:  Radiograph 11/10/2023 FINDINGS: Stable large cardiac silhouette. There is increased patchy airspace disease in the LEFT upper lobe and LEFT lower lobe. RIGHT lung clear. No pneumothorax. No acute osseous abnormality. IMPRESSION: New airspace patchy density in the LEFT lung. Findings concerning for multifocal pneumonia.  Electronically Signed   By: Jackquline Boxer M.D.   On: 12/06/2023 13:52   CT Head Wo Contrast Result Date: 11/10/2023 EXAM: CT HEAD WITHOUT CONTRAST 11/10/2023 10:52:18 AM TECHNIQUE: CT of the head was performed without the administration of intravenous contrast. Automated exposure control, iterative reconstruction, and/or weight based adjustment of the mA/kV was utilized to reduce the radiation dose to as low as reasonably achievable. COMPARISON: MRI of the head dated 12/17/2022. CLINICAL HISTORY: Mental status change, unknown cause. Pt arrived via [facility] from dialysis, from home, pt was A\T\O this morning 0645 am before Tx and become lethargic and zoned out around 0800 this morning and was slow to respond, pt states she feels a little sleepy but able to answer EMSs questions. FINDINGS: BRAIN AND VENTRICLES: No acute hemorrhage. Gray-white differentiation is preserved. No hydrocephalus. No extra-axial collection. No mass effect or midline shift. Chronic encephalomalacia changes present within the left parietal and occipital lobes. Mild periventricular white matter disease. ORBITS: No acute abnormality. SINUSES: No acute abnormality. SOFT TISSUES AND SKULL: No acute soft tissue abnormality. No skull fracture. Calcifications within the carotid siphons. IMPRESSION: 1. No acute intracranial abnormality. 2. Chronic encephalomalacia changes in the left parietal and occipital lobes. 3. Mild periventricular white matter disease. 4. Calcifications within the carotid siphons. Electronically signed by: Evalene Coho MD 11/10/2023 10:58 AM EDT RP Workstation: [id number]   DG Chest Portable 1 View Result Date: 11/10/2023 CLINICAL DATA:  Confusion.  Lethargy. EXAM: PORTABLE CHEST 1 VIEW COMPARISON:  10/27/2023. FINDINGS: Stable cardiomegaly. Aortic atherosclerosis. Similar streaky left basilar opacities, favored to reflect atelectasis. The right lung is clear. No sizable pleural effusion or pneumothorax.  Degenerative changes of the right glenohumeral joint. No acute osseous abnormality. IMPRESSION: 1. Similar streaky left basilar opacities, favored to reflect atelectasis. 2. Stable cardiomegaly. Electronically Signed   By: Harrietta Sherry  M.D.   On: 11/10/2023 10:10     Subjective: Pt says she is really hoping to go home today and resume her regular HD treatment outpatient tomorrow.  She has had no further bleeding since restarting anticoagulation.   Discharge Exam: Vitals:   12/07/23 0200 12/07/23 0500  BP: (!) 147/66 138/67  Pulse: 78 75  Resp: 18 18  Temp: 98.3 F (36.8 C) 98.2 F (36.8 C)  SpO2: 95% 98%   Vitals:   12/06/23 1500 12/06/23 2244 12/07/23 0200 12/07/23 0500  BP: (!) 142/64 (!) 143/58 (!) 147/66 138/67  Pulse: 84 84 78 75  Resp: 18 18 18 18   Temp: 98 F (36.7 C) 98.2 F (36.8 C) 98.3 F (36.8 C) 98.2 F (36.8 C)  TempSrc: Oral Oral Oral Oral  SpO2: 98% 95% 95% 98%  Weight:    72.7 kg  Height:       General: Pt is alert, awake, not in acute distress Cardiovascular: normal S1/S2 +, no rubs, no gallops Respiratory: CTA bilaterally, no wheezing, no rhonchi Abdominal: Soft, NT, ND, bowel sounds + Extremities: left UE with dry gangrene changes; bilateral AKAs   The results of significant diagnostics from this hospitalization (including imaging, microbiology, ancillary and laboratory) are listed below for reference.     Microbiology: No results found for this or any previous visit (from the past 240 hours).   Labs: BNP (last 3 results) Recent Labs    12/28/22 0842  BNP 2,982.0*   Basic Metabolic Panel: Recent Labs  Lab 12/03/23 0525 12/04/23 0832 12/05/23 0446 12/06/23 0324 12/07/23 0424  NA 132* 133* 125* 130* 135  K 5.0 3.8 4.1 4.3 4.1  CL 99 97* 90* 93* 100  CO2 24 25 23 24 27   GLUCOSE 78 92 100* 178* 232*  BUN 29* 15 18 27* 27*  CREATININE 3.70* 2.61* 3.40* 4.53* 3.70*  CALCIUM  7.9* 7.7* 7.6* 7.7* 8.1*  PHOS 4.5 3.2 4.1 4.8* 3.8    Liver Function Tests: Recent Labs  Lab 12/01/23 1408 12/03/23 0525 12/04/23 0832 12/05/23 0446 12/06/23 0324 12/07/23 0424  AST 18  --   --   --   --   --   ALT 8  --   --   --   --   --   ALKPHOS 81  --   --   --   --   --   BILITOT 0.4  --   --   --   --   --   PROT 6.4*  --   --   --   --   --   ALBUMIN  3.0* 2.8* 2.7* 2.7* 2.4* 2.4*   No results for input(s): LIPASE, AMYLASE in the last 168 hours. No results for input(s): AMMONIA in the last 168 hours. CBC: Recent Labs  Lab 12/03/23 0525 12/04/23 0832 12/05/23 0637 12/06/23 0324 12/07/23 0424  WBC 10.2 7.4 7.2 15.5* 9.4  HGB 8.2* 8.0* 8.3* 7.8* 7.9*  HCT 27.2* 27.7* 27.3* 25.1* 25.8*  MCV 86.1 90.2 84.5 82.3 83.0  PLT 287 178 288 263 280   Cardiac Enzymes: No results for input(s): CKTOTAL, CKMB, CKMBINDEX, TROPONINI in the last 168 hours. BNP: Invalid input(s): POCBNP CBG: Recent Labs  Lab 12/06/23 0735 12/06/23 1637 12/06/23 2315 12/07/23 0243 12/07/23 0746  GLUCAP 159* 151* 240* 238* 204*   D-Dimer No results for input(s): DDIMER in the last 72 hours. Hgb A1c No results for input(s): HGBA1C in the last 72 hours. Lipid Profile No results  for input(s): CHOL, HDL, LDLCALC, TRIG, CHOLHDL, LDLDIRECT in the last 72 hours. Thyroid  function studies No results for input(s): TSH, T4TOTAL, T3FREE, THYROIDAB in the last 72 hours.  Invalid input(s): FREET3 Anemia work up No results for input(s): VITAMINB12, FOLATE, FERRITIN, TIBC, IRON, RETICCTPCT in the last 72 hours. Urinalysis    Component Value Date/Time   COLORURINE AMBER (A) 11/10/2023 1101   APPEARANCEUR TURBID (A) 11/10/2023 1101   LABSPEC 1.017 11/10/2023 1101   PHURINE 5.0 11/10/2023 1101   GLUCOSEU NEGATIVE 11/10/2023 1101   HGBUR LARGE (A) 11/10/2023 1101   BILIRUBINUR NEGATIVE 11/10/2023 1101   BILIRUBINUR neg 02/15/2017 0913   KETONESUR NEGATIVE 11/10/2023 1101   PROTEINUR 100 (A)  11/10/2023 1101   UROBILINOGEN 0.2 02/15/2017 0913   NITRITE NEGATIVE 11/10/2023 1101   LEUKOCYTESUR MODERATE (A) 11/10/2023 1101   Sepsis Labs Recent Labs  Lab 12/04/23 0832 12/05/23 0637 12/06/23 0324 12/07/23 0424  WBC 7.4 7.2 15.5* 9.4   Microbiology No results found for this or any previous visit (from the past 240 hours).  Time coordinating discharge: 33 mins  SIGNED:  Afton Louder, MD  [facility] 12/07/2023, 10:16 AM How to contact the [facility] Attending or Consulting provider 7A - 7P or covering provider during after hours 7P -7A, for this patient?  Check the care team in [facility] and look for a) attending/consulting [facility] provider listed and b) the [facility] team listed Log into [email or web address] and use [facility] universal password to access. If you do not have the password, please contact the hospital operator. Locate the [facility] provider you are looking for under [facility] and page to a number that you can be directly reached. If you still have difficulty reaching the provider, please page the [facility] (Director on Call) for the Hospitalists listed on amion for assistance.

## 2023-12-07 NOTE — Evaluation (Signed)
 Clinical/Bedside Swallow Evaluation Patient Details  Name: Kathryn Beck MRN: [id number] Date of Birth: March 19, 1956  Today's Date: 12/07/2023 Time: SLP Start Time (ACUTE ONLY): 9061 SLP Stop Time (ACUTE ONLY): 1001 SLP Time Calculation (min) (ACUTE ONLY): 23 min  Past Medical History:  Past Medical History:  Diagnosis Date   Acid reflux    Amputated left leg (HCC)    bilateral BKA   Anemia    Arthritis    Axillary masses    Soft tissue - status post excision   Back pain    CHF (congestive heart failure) (HCC)    COVID-19 virus infection 04/06/2019   Depression    End-stage renal disease (HCC)    M/W/F dialysis   Essential hypertension    Headache    years ago   History of blood transfusion    History of cardiac catheterization    Normal coronary arteries October 2020   History of claustrophobia    History of pneumonia 2019   Hypoxia 04/03/2019   Memory loss    Mixed hyperlipidemia    Obesity    Pancreatitis    Peritoneal dialysis catheter in place [facility])    Pneumonia due to COVID-19 virus 04/02/2019   Sleep apnea    Noncompliant with CPAP   Stroke (HCC)    mini stroke   Type 2 diabetes mellitus (HCC)    Past Surgical History:  Past Surgical History:  Procedure Laterality Date   ABDOMINAL AORTOGRAM W/LOWER EXTREMITY N/A 04/30/2022   Procedure: ABDOMINAL AORTOGRAM W/LOWER EXTREMITY;  Surgeon: Magda Debby SAILOR, MD;  [facility];  Service: Cardiovascular;  Laterality: N/A;   ABDOMINAL AORTOGRAM W/LOWER EXTREMITY N/A 07/21/2022   Procedure: ABDOMINAL AORTOGRAM W/LOWER EXTREMITY;  Surgeon: Serene Gaile ORN, MD;  [facility]: [facility];  Service: Cardiovascular;  Laterality: N/A;   ABDOMINAL HYSTERECTOMY     ACHILLES TENDON LENGTHENING  08/15/2022   Procedure: ACHILLES TENDON LENGTHENING;  Surgeon: Silva Juliene SAUNDERS, DPM;  Location: [facility] OR;  Service: Podiatry;;   AMPUTATION Right 05/29/2022   Procedure: RIGHT BELOW THE KNEE AMPUTATION;  Surgeon: Harden Jerona GAILS, MD;  Location: [facility] OR;  Service: Orthopedics;  Laterality: Right;   AMPUTATION Left 09/04/2022   Procedure: AMPUTATION FOOT, serial irrigation;  Surgeon: Joya Stabs, DPM;  Location: [facility] OR;  Service: Podiatry;  Laterality: Left;  Surgical team to do block   AMPUTATION Left 10/07/2022   Procedure: LEFT BELOW KNEE AMPUTATION;  Surgeon: Harden Jerona GAILS, MD;  Location: [facility] OR;  Service: Orthopedics;  Laterality: Left;   AMPUTATION FINGER Left 09/29/2023   Procedure: AMPUTATION, FINGER;  Surgeon: Harden Jerona GAILS, MD;  Location: [facility] OR;  Service: Orthopedics;  Laterality: Left;  LEFT HAND LONG FINGER RAY AMPUTATION   AV FISTULA PLACEMENT Left 09/02/2017   Procedure: creation of left arm ARTERIOVENOUS (AV) FISTULA;  Surgeon: Serene Gaile ORN, MD;  Location: [name] OR;  Service: Vascular;  Laterality: Left;   COLONOSCOPY  2008   Dr. Harvey: normal    COLONOSCOPY N/A 12/18/2016   Dr. Harvey: multiple tubular adenomas, internal hemorrhoids. Surveillance in 3 years    COLONOSCOPY N/A 12/05/2023   Procedure: COLONOSCOPY;  Surgeon: Cindie Carlin POUR, DO;  [facility]: [facility];  Service: Endoscopy;  Laterality: N/A;   ESOPHAGEAL DILATION N/A 10/13/2015   Procedure: ESOPHAGEAL DILATION;  Surgeon: Claudis RAYMOND Rivet, MD;  [facility]: [facility];  Service: Endoscopy;  Laterality: N/A;   ESOPHAGEAL DILATION N/A 12/03/2023   Procedure: DILATION, ESOPHAGUS;  Surgeon: Cindie Carlin POUR, DO;  [facility];  Service: Endoscopy;  Laterality: N/A;   ESOPHAGOGASTRODUODENOSCOPY N/A 10/13/2015   Dr. Golda: chronic gastritis on path, no H.pylori. Empiric dilation    ESOPHAGOGASTRODUODENOSCOPY N/A 12/18/2016   Dr. Harvey: mild gastritis. BRAVO study revealed uncontrolled GERD. Dysphagia secondary to uncontrolled reflux   ESOPHAGOGASTRODUODENOSCOPY N/A 12/03/2023   Procedure: EGD (ESOPHAGOGASTRODUODENOSCOPY);  Surgeon: Cindie Carlin POUR, DO;  [facility];  Service: Endoscopy;  Laterality: N/A;   FOOT  SURGERY Bilateral    nerve     INCISION AND DRAINAGE OF WOUND Left 10/30/2023   Procedure: IRRIGATION AND DEBRIDEMENT WOUND;  Surgeon: Arlinda Buster, MD;  [facility] OR;  Service: Orthopedics;  Laterality: Left;  LEFT HAND WOUND   LEFT HEART CATH AND CORONARY ANGIOGRAPHY N/A 12/29/2018   Procedure: LEFT HEART CATH AND CORONARY ANGIOGRAPHY;  Surgeon: Dann Candyce RAMAN, MD;  Location: [facility] INVASIVE CV LAB;  Service: Cardiovascular;  Laterality: N/A;   LOWER EXTREMITY ANGIOGRAPHY Right 05/04/2022   Procedure: Lower Extremity Angiography;  Surgeon: Lanis Fonda BRAVO, MD;  Location: [facility] INVASIVE CV LAB;  Service: Cardiovascular;  Laterality: Right;   LUNG BIOPSY     MASS EXCISION Right 01/09/2013   Procedure: EXCISION OF NEOPLASM OF RIGHT  AXILLA  AND EXCISION OF NEOPLASM OF LEFT AXILLA;  Surgeon: Oneil DELENA Budge, MD;  Location: [facility] ORS;  Service: General;  Laterality: Right;  procedure end @ 08:23   MYRINGOTOMY WITH TUBE PLACEMENT Bilateral 04/28/2017   Procedure: BILATERAL MYRINGOTOMY WITH TUBE PLACEMENT;  Surgeon: Karis Clunes, MD;  [facility]: [facility] OR;  Service: ENT;  Laterality: Bilateral;   PERIPHERAL VASCULAR BALLOON ANGIOPLASTY Right 05/04/2022   Procedure: PERIPHERAL VASCULAR BALLOON ANGIOPLASTY;  Surgeon: Lanis Fonda BRAVO, MD;  Location: [facility] INVASIVE CV LAB;  Service: Cardiovascular;  Laterality: Right;  PT   PERIPHERAL VASCULAR INTERVENTION Right 05/04/2022   Procedure: PERIPHERAL VASCULAR INTERVENTION;  Surgeon: Lanis Fonda BRAVO, MD;  Location: [facility] INVASIVE CV LAB;  Service: Cardiovascular;  Laterality: Right;  SFA   PERIPHERAL VASCULAR INTERVENTION Left 07/21/2022   Procedure: PERIPHERAL VASCULAR INTERVENTION;  Surgeon: Serene Gaile ORN, MD;  [facility];  Service: Cardiovascular;  Laterality: Left;   REVISION OF ARTERIOVENOUS GORETEX GRAFT Left 05/04/2018   Procedure: TRANSPOSITION OF CEPHALIC VEIN ARTERIOVENOUS FISTULA LEFT ARM;  Surgeon: Oris Krystal FALCON, MD;  Location: [facility] OR;  Service:  Vascular;  Laterality: Left;   SAVORY DILATION N/A 12/18/2016   Procedure: SAVORY DILATION;  Surgeon: Harvey Margo CROME, MD;  [facility];  Service: Endoscopy;  Laterality: N/A;   TRANSMETATARSAL AMPUTATION Left 08/15/2022   Procedure: TRANSMETATARSAL AMPUTATION;  Surgeon: Silva Juliene SAUNDERS, DPM;  Location: [facility] OR;  Service: Podiatry;  Laterality: Left;   HPI:  68 y.o. female with a complex medical history of GERD, pancreatitis, A-fib on apixaban , moderate aortic stenosis, PAD (s/p R BKA in 05/2022), HTN, HLD, Type 2 DM, bilateral adrenal adenoma , chronic HFpEF, OSA, ESRD (MWF), history of TIA's and history of MSSA infections, who was brought to the hospital after being found to have low hemoglobin in outpatient labs at dialysis center; GI note stated Complains of solid food dysphagia. Some heartburn at times; CXR on 9/15 indicated new airspace patchy density in L lung; multifocal PNA considered. SLP consulted for swallow evaluation.    Assessment / Plan / Recommendation  Clinical Impression  Pt seen for clinical swallowing evaluation with reported coughing post EGD, but this has since resolved during meals.  Globus sensation and GERD symptoms (ie: belching) reported with medications initiated for GERD per family report.  Pt consumed thin via small sips with straw/cup and self-fed puree/solids with min verbal cues for bite size/volume required initially, but pt followed precautions readily after initial reminder.  Pt required liquid wash to clear solids (dry) from oral cavity, but otherwise, OME was unremarkable with adequate mastication efforts observed.  Delayed cough noted after entire po trial which could indicate pharyngeal retention and/or impact of dry consistency into pharynx.  Pt/daughter given esophageal precautions via hand-out and educated re: A with meals using these precautions consistently.  Pt in agreement and appreciative.  Continue pt preferred regular/thin liquid consistency  with renal diet and esophageal/general swallowing precautions in place during all po intake.  ST will s/o in acute setting.  Thank you for this consult. SLP Visit Diagnosis: Dysphagia, unspecified (R13.10)    Aspiration Risk  Mild aspiration risk    Diet Recommendation   Thin;Age appropriate regular  Medication Administration: Whole meds with liquid    Other  Recommendations Oral Care Recommendations: Oral care BID     Assistance Recommended at Discharge  PRN  Functional Status Assessment Patient has had a recent decline in their functional status and demonstrates the ability to make significant improvements in function in a reasonable and predictable amount of time.  Frequency and Duration  (evaluation only)          Prognosis Prognosis for improved oropharyngeal function: Good      Swallow Study   General Date of Onset: 12/02/23 HPI: 68 y.o. female with a complex medical history of GERD, pancreatitis, A-fib on apixaban , moderate aortic stenosis, PAD (s/p R BKA in 05/2022), HTN, HLD, Type 2 DM, bilateral adrenal adenoma , chronic HFpEF, OSA, ESRD (MWF), history of TIA's and history of MSSA infections, who was brought to the hospital after being found to have low hemoglobin in outpatient labs at dialysis center; GI note stated Complains of solid food dysphagia. Some heartburn at times; CXR on 9/15 indicated new airspace patchy density in L lung; multifocal PNA considered. SLP consulted for swallow evaluation. Type of Study: Bedside Swallow Evaluation Previous Swallow Assessment: n/a Diet Prior to this Study: Regular;Thin liquids (Level 0) Temperature Spikes Noted: No Respiratory Status: Room air History of Recent Intubation: No Behavior/Cognition: Alert;Cooperative Oral Cavity Assessment: Within Functional Limits Oral Care Completed by SLP: Other (Comment) (recent completion by pt) Oral Cavity - Dentition: Adequate natural dentition Vision: Functional for  self-feeding Self-Feeding Abilities: Able to feed self Patient Positioning: Upright in bed Baseline Vocal Quality: Normal Volitional Cough: Strong Volitional Swallow: Able to elicit    Oral/Motor/Sensory Function Overall Oral Motor/Sensory Function: Within functional limits   Ice Chips Ice chips: Not tested   Thin Liquid Thin Liquid: Within functional limits Presentation: Cup;Straw Other Comments: small sips observed    Nectar Thick Nectar Thick Liquid: Not tested   Honey Thick Honey Thick Liquid: Not tested   Puree Puree: Within functional limits Presentation: Self Fed   Solid     Solid: Impaired Presentation: Self Fed Pharyngeal Phase Impairments: Cough - Delayed Other Comments: required liquid wash      Pat Krishika Bugge,M.S., CCC-SLP 12/07/2023,11:42 AM

## 2023-12-08 ENCOUNTER — Telehealth: Payer: Self-pay

## 2023-12-08 DIAGNOSIS — Z992 Dependence on renal dialysis: Secondary | ICD-10-CM | POA: Diagnosis not present

## 2023-12-08 DIAGNOSIS — N186 End stage renal disease: Secondary | ICD-10-CM | POA: Diagnosis not present

## 2023-12-08 DIAGNOSIS — N2581 Secondary hyperparathyroidism of renal origin: Secondary | ICD-10-CM | POA: Diagnosis not present

## 2023-12-08 DIAGNOSIS — L03114 Cellulitis of left upper limb: Secondary | ICD-10-CM | POA: Diagnosis not present

## 2023-12-08 NOTE — Transitions of Care (Post Inpatient/ED Visit) (Signed)
 12/08/2023  Name: Kathryn Beck MRN: [id number] DOB: May 18, 1955  Today's TOC FU Call Status: Today's TOC FU Call Status:: Successful TOC FU Call Completed TOC FU Call Complete Date: 12/08/23 Patient's Name and Date of Birth confirmed.  Transition Care Management Follow-up Telephone Call Date of Discharge: 12/07/23 [facility] Type of Discharge: Inpatient Admission Primary Inpatient Discharge Diagnosis:: GI bleed How have you been since you were released from the hospital?: Better Any questions or concerns?: No  Items Reviewed: Did you receive and understand the discharge instructions provided?: Yes Medications obtained,verified, and reconciled?: Yes (Medications Reviewed) Any new allergies since your discharge?: No Dietary orders reviewed?: Yes Type of Diet Ordered:: Renal Carb Modified with fuid restriction Do you have support at home?: Yes People in Home [RPT]: child(ren), adult Name of Support/Comfort Primary Source: [name]  Medications Reviewed Today: Medications Reviewed Today     Reviewed by Jola Critzer, RN (Case Manager) on 12/08/23 at 1204  Med List Status: <None>   Medication Order Taking? Sig Documenting Provider Last Dose Status Informant  0.9 %  sodium chloride  infusion [id number]   Lockamy, Randi L, NP-C  Active   acetaminophen  (TYLENOL ) 500 MG tablet [id number] Yes Take 1,000 mg by mouth every 6 (six) hours as needed for mild pain (pain score 1-3). [provider]  Active Child, Pharmacy Records  amLODipine  (NORVASC ) 10 MG tablet [id number] Yes Take 0.5 tablets (5 mg total) by mouth daily. Pearlean Manus, MD  Active Child, Pharmacy Records           Med Note (WARD, CHUCK MATSU   Wed Nov 10, 2023  7:52 PM)    apixaban  (ELIQUIS ) 5 MG TABS tablet [id number] Yes Take 1 tablet (5 mg total) by mouth 2 (two) times daily. For Afib Pearlean Manus, MD  Active Child, Pharmacy Records  aspirin  81 MG chewable tablet [id number] Yes Chew 2  tablets (162 mg total) by mouth daily. Bevely Doffing, FNP  Active Child, Pharmacy Records  B Complex-C-Folic Acid  (RENA-VITE RX) 1 MG TABS [id number] Yes Take 1 tablet by mouth daily. [provider]  Active Child, Pharmacy Records           Med Note WORLEY, [place] S   Thu Nov 19, 2022 11:34 AM)    busPIRone  (BUSPAR ) 7.5 MG tablet [id number] Yes Take 1 tablet (7.5 mg total) by mouth 2 (two) times daily. Antonetta Rollene BRAVO, MD  Active Child, Pharmacy Records  calcitRIOL  (ROCALTROL ) 0.5 MCG capsule [id number] Yes Take 2 capsules (1 mcg total) by mouth every Monday, Wednesday, and Friday with hemodialysis. Caleen Burgess BROCKS, MD  Active Child, Pharmacy Records           Med Note DRENA, CHUCK MATSU [place] Dec 01, 2023  5:33 PM)    CEFAZOLIN  SODIUM IV [id number] Yes Inject into the vein every Monday, Wednesday, and Friday with hemodialysis. [provider]  Active Child, Pharmacy Records  cinacalcet  (SENSIPAR ) 30 MG tablet [id number] Yes Take 3 tablets (90 mg total) by mouth every Monday, Wednesday, and Friday. Johnson, Clanford L, MD  Active   Darbepoetin Alfa  (ARANESP ) 40 MCG/0.4ML SOSY injection [id number] Yes Inject 0.4 mLs (40 mcg total) into the skin every Friday at 6 PM.  Patient taking differently: Inject 40 mcg into the vein every Friday at 6 PM.   Caleen Burgess BROCKS, MD  Active Child, Pharmacy Records  ezetimibe  (ZETIA ) 10 MG tablet [id number] Yes Take 1 tablet (10 mg total) by mouth daily. Antonetta Rollene  E, MD  Active Child, Pharmacy Records    Discontinued 05/28/11 1302 (Patient has not taken in last 30 days)     Discontinued 05/28/11 1302 (Change in therapy)   insulin  aspart (NOVOLOG ) 100 UNIT/ML injection [id number] Yes Inject 0-6 Units into the skin 3 (three) times daily with meals. CBG 70 - 120: 0 units  CBG 121 - 150: 0 units  CBG 151 - 200: 1 unit  CBG 201-250: 2 units  CBG 251-300: 3 units  CBG 301-350: 4 units  CBG 351-400: 5 units  CBG > 400: Give 10 units and call MD  Willette Adriana LABOR, MD  Active Child, Pharmacy Records  insulin  glargine, 2 Unit Dial , (TOUJEO  MAX SOLOSTAR) 300 UNIT/ML Solostar Pen [id number] Yes Inject 30 Units into the skin daily. May need to slowly increase the dose depending upon your blood sugar, follow-up with PCP Trixie File, MD  Active Child, Pharmacy Records           Med Note WORLEY, [place] S   Wed Oct 27, 2023  2:44 PM)    metoprolol  succinate (TOPROL -XL) 50 MG 24 hr tablet [id number] Yes TAKE (1) TABLET BY MOUTH DAILY WITH FOOD *TAKE AFTER DIALYSIS*  Patient taking differently: Take 50 mg by mouth every Tuesday, Thursday, Saturday, and Sunday. Non-dialysis days   Antonetta Rollene BRAVO, MD  Active Child, Pharmacy Records           Med Note [place], CHUCK MATSU   Wed Nov 10, 2023  3:02 PM)    ondansetron  (ZOFRAN ) 4 MG tablet [id number]  Take 4 mg by mouth every 8 (eight) hours as needed for nausea or vomiting. [provider]  Active Child, Pharmacy Records  pantoprazole  (PROTONIX ) 40 MG tablet [id number] Yes Take 1 tablet (40 mg total) by mouth daily. Vicci Afton CROME, MD  Active   sertraline  (ZOLOFT ) 50 MG tablet [id number] Yes Take one and a half tablets once daily  Patient taking differently: Take 75 mg by mouth daily. Take one and a half tablets once daily   Antonetta Rollene BRAVO, MD  Active Child, Pharmacy Records  torsemide  (DEMADEX ) 100 MG tablet [id number] Yes Take 1 tablet (100 mg total) by mouth every Tuesday, Thursday, Saturday, and Sunday.  Patient taking differently: Take 100 mg by mouth every Tuesday, Thursday, Saturday, and Sunday. Non-dialysis days   Pearlean Manus, MD  Active Child, Pharmacy Records           Med Note [place], CHUCK MATSU [place] Nov 10, 2023  7:52 PM)    [name]  500 MG chewable tablet [id number] Yes Chew 1-2 tablets (500-1,000 mg total) by mouth See admin instructions. Take 1000mg  (2 tablets) by mouth with meals and 500mg  (1 tablet) with snacks Antonetta Rollene BRAVO, MD  Active Child, Pharmacy  Records  Med List Note Sydell Heron KATHEE OBIE 11/25/23 1342): Patient has dialysis on M-W-F, pt's daughter handles pt's medications 11/25/23 Medications list review with daughter, [name] on call today. Noted changes from PCP HFU visit.             Home Care and Equipment/Supplies: Were Home Health Services Ordered?: Yes Name of Home Health Agency:: [facility] Has Agency set up a time to come to your home?: No EMR reviewed for Home Health Orders:  (Daughter has contact information to call) Any new equipment or medical supplies ordered?: No  Functional Questionnaire: Do you need assistance with bathing/showering or dressing?: Yes Do you need assistance with meal preparation?: Yes Do you need assistance  with eating?: Yes Do you have difficulty maintaining continence: Yes Do you need assistance with getting out of bed/getting out of a chair/moving?: Yes Do you have difficulty managing or taking your medications?: Yes  Follow up appointments reviewed: PCP Follow-up appointment confirmed?: No (daughter states she will be calling) MD Provider Line Number:[phone] Given: No Specialist Hospital Follow-up appointment confirmed?: Yes Date of Specialist follow-up appointment?: 12/28/23 Follow-Up Specialty Provider:: Dr. Arlinda for left hand Do you need transportation to your follow-up appointment?: No Do you understand care options if your condition(s) worsen?: Yes-patient verbalized understanding ( Symptoms:  Be aware of symptoms like bloody or black stools, bloody vomit (or vomit that looks like coffee grounds), dizziness, lightheadedness, or sudden severe abdominal pain.  Seek help if you experience these symptoms.)  SDOH Interventions Today    Flowsheet Row Most Recent Value  SDOH Interventions   Food Insecurity Interventions Intervention Not Indicated  Housing Interventions Intervention Not Indicated  Transportation Interventions Intervention Not Indicated  Utilities  Interventions Intervention Not Indicated   Discussed 30 day TOC program with daughter Augusto.  She declined program at this time.  Advised her to contact physician for changes or concerns.  She verbalized understanding.    Rodriques Badie J. Shanikka Wonders RN, MSN [facility], [facility] RN Care Manager Direct Dial : [phone]  Fax: [phone] Website: [email or web address]

## 2023-12-08 NOTE — Patient Instructions (Addendum)
 Visit Information  Thank you for taking time to visit with me today. Please don't hesitate to contact me if I can be of assistance to you.  Please watch for: Symptoms: Be aware of symptoms like bloody or black stools, bloody vomit (or vomit that looks like coffee grounds), dizziness, lightheadedness, or sudden severe abdominal pain. Seek Immediate Help: If you experience any of these symptoms, especially if they are sudden or severe, seek immediate medical attention.  Follow up with PCP for hospital follow up.    Patient verbalizes understanding of instructions and care plan provided today and agrees to view in MyChart. Active MyChart status and patient understanding of how to access instructions and care plan via MyChart confirmed with patient.     The patient has been provided with contact information for the care management team and has been advised to call with any health related questions or concerns.   Please call the care guide team at [phone] if you need to cancel or reschedule your appointment.   Please call the Suicide and Crisis Lifeline: 988 if you are experiencing a Mental Health or Behavioral Health Crisis or need someone to talk to.  Monasia Lair J. Zyanya Glaza RN, MSN [facility], [facility] Health RN Care Manager Direct Dial : [phone]  Fax: [phone] Website: [email or web address]

## 2023-12-09 ENCOUNTER — Ambulatory Visit (INDEPENDENT_AMBULATORY_CARE_PROVIDER_SITE_OTHER): Admitting: Otolaryngology

## 2023-12-09 DIAGNOSIS — N186 End stage renal disease: Secondary | ICD-10-CM | POA: Diagnosis not present

## 2023-12-09 DIAGNOSIS — W19XXXA Unspecified fall, initial encounter: Secondary | ICD-10-CM | POA: Diagnosis not present

## 2023-12-09 DIAGNOSIS — G4733 Obstructive sleep apnea (adult) (pediatric): Secondary | ICD-10-CM | POA: Diagnosis not present

## 2023-12-09 DIAGNOSIS — Z8673 Personal history of transient ischemic attack (TIA), and cerebral infarction without residual deficits: Secondary | ICD-10-CM | POA: Diagnosis not present

## 2023-12-09 DIAGNOSIS — T8742 Infection of amputation stump, left upper extremity: Secondary | ICD-10-CM | POA: Diagnosis not present

## 2023-12-09 DIAGNOSIS — I132 Hypertensive heart and chronic kidney disease with heart failure and with stage 5 chronic kidney disease, or end stage renal disease: Secondary | ICD-10-CM | POA: Diagnosis not present

## 2023-12-09 DIAGNOSIS — E782 Mixed hyperlipidemia: Secondary | ICD-10-CM | POA: Diagnosis not present

## 2023-12-09 DIAGNOSIS — Z89511 Acquired absence of right leg below knee: Secondary | ICD-10-CM | POA: Diagnosis not present

## 2023-12-09 DIAGNOSIS — K219 Gastro-esophageal reflux disease without esophagitis: Secondary | ICD-10-CM | POA: Diagnosis not present

## 2023-12-09 DIAGNOSIS — I70203 Unspecified atherosclerosis of native arteries of extremities, bilateral legs: Secondary | ICD-10-CM | POA: Diagnosis not present

## 2023-12-09 DIAGNOSIS — L03114 Cellulitis of left upper limb: Secondary | ICD-10-CM | POA: Diagnosis not present

## 2023-12-09 DIAGNOSIS — I5032 Chronic diastolic (congestive) heart failure: Secondary | ICD-10-CM | POA: Diagnosis not present

## 2023-12-09 DIAGNOSIS — E1122 Type 2 diabetes mellitus with diabetic chronic kidney disease: Secondary | ICD-10-CM | POA: Diagnosis not present

## 2023-12-09 DIAGNOSIS — L02512 Cutaneous abscess of left hand: Secondary | ICD-10-CM | POA: Diagnosis not present

## 2023-12-09 DIAGNOSIS — I4891 Unspecified atrial fibrillation: Secondary | ICD-10-CM | POA: Diagnosis not present

## 2023-12-09 DIAGNOSIS — N2581 Secondary hyperparathyroidism of renal origin: Secondary | ICD-10-CM | POA: Diagnosis not present

## 2023-12-09 DIAGNOSIS — E1151 Type 2 diabetes mellitus with diabetic peripheral angiopathy without gangrene: Secondary | ICD-10-CM | POA: Diagnosis not present

## 2023-12-09 DIAGNOSIS — J9811 Atelectasis: Secondary | ICD-10-CM | POA: Diagnosis not present

## 2023-12-09 DIAGNOSIS — D631 Anemia in chronic kidney disease: Secondary | ICD-10-CM | POA: Diagnosis not present

## 2023-12-09 DIAGNOSIS — I350 Nonrheumatic aortic (valve) stenosis: Secondary | ICD-10-CM | POA: Diagnosis not present

## 2023-12-09 DIAGNOSIS — I480 Paroxysmal atrial fibrillation: Secondary | ICD-10-CM | POA: Diagnosis not present

## 2023-12-09 DIAGNOSIS — B9561 Methicillin susceptible Staphylococcus aureus infection as the cause of diseases classified elsewhere: Secondary | ICD-10-CM | POA: Diagnosis not present

## 2023-12-10 ENCOUNTER — Ambulatory Visit: Payer: Self-pay

## 2023-12-10 DIAGNOSIS — N186 End stage renal disease: Secondary | ICD-10-CM | POA: Diagnosis not present

## 2023-12-10 DIAGNOSIS — N2581 Secondary hyperparathyroidism of renal origin: Secondary | ICD-10-CM | POA: Diagnosis not present

## 2023-12-10 DIAGNOSIS — L03114 Cellulitis of left upper limb: Secondary | ICD-10-CM | POA: Diagnosis not present

## 2023-12-10 DIAGNOSIS — Z992 Dependence on renal dialysis: Secondary | ICD-10-CM | POA: Diagnosis not present

## 2023-12-10 NOTE — Telephone Encounter (Signed)
  FYI Only or Action Required?: Action required by provider: request for appointment and update on patient condition. Patient needs to schedule hospital follow up  Patient was last seen in primary care on 11/24/2023 by Antonetta Rollene BRAVO, MD.  Called Nurse Triage reporting Cough.  Symptoms began a week ago.  Interventions attempted: Other: recent hospitalization.  Symptoms are: rapidly improving.  Triage Disposition: needs to be seen next week  Patient/caregiver understands and will follow disposition?: Yes  Copied from CRM #[id number]. Topic: Clinical - Medical Advice >> Dec 10, 2023 10:38 AM Willma SAUNDERS wrote: Reason for CRM: Patient's daughter Ewing calling with questions in regards to her mother. Patient was discharged from hostpital a few days ago and while there was told she had fluid in her lungs but nothing was done about it, wants to know if they should be concerned or if she needs to be on antibiotics.  Also patient is having trouble sleeping, could she give her OTC melatonin 3 or 5 mgs.  Lawayna can be reached at [phone] Reason for Disposition  Cough  Answer Assessment - Initial Assessment Questions 1. ONSET: When did the cough begin?      A week ago 2. SEVERITY: How bad is the cough today?      Wet cough, but not coughing anything up 3. SPUTUM: Describe the color of your sputum (e.g., none, dry cough; clear, white, yellow, green)     Not coughing anything up 4. HEMOPTYSIS: Are you coughing up any blood? If Yes, ask: How much? (e.g., flecks, streaks, tablespoons, etc.)     denies 5. DIFFICULTY BREATHING: Are you having difficulty breathing? If Yes, ask: How bad is it? (e.g., mild, moderate, severe)      denies 6. FEVER: Do you have a fever? If Yes, ask: What is your temperature, how was it measured, and when did it start?     denies 7. CARDIAC HISTORY: Do you have any history of heart disease? (e.g., heart attack, congestive heart failure)       extensive 8. LUNG HISTORY: Do you have any history of lung disease?  (e.g., pulmonary embolus, asthma, emphysema)     extensive 9. PE RISK FACTORS: Do you have a history of blood clots? (or: recent major surgery, recent prolonged travel, bedridden)     Recent hospitalization 10. OTHER SYMPTOMS: Do you have any other symptoms? (e.g., runny nose, wheezing, chest pain)       denies 11. PREGNANCY: Is there any chance you are pregnant? When was your last menstrual period?       N/a 12. TRAVEL: Have you traveled out of the country in the last month? (e.g., travel history, exposures)       N/a  Protocols used: Cough - Acute Non-Productive-A-AH

## 2023-12-10 NOTE — Telephone Encounter (Signed)
 scheduled

## 2023-12-13 DIAGNOSIS — L03114 Cellulitis of left upper limb: Secondary | ICD-10-CM | POA: Diagnosis not present

## 2023-12-13 DIAGNOSIS — Z992 Dependence on renal dialysis: Secondary | ICD-10-CM | POA: Diagnosis not present

## 2023-12-13 DIAGNOSIS — N186 End stage renal disease: Secondary | ICD-10-CM | POA: Diagnosis not present

## 2023-12-13 DIAGNOSIS — N2581 Secondary hyperparathyroidism of renal origin: Secondary | ICD-10-CM | POA: Diagnosis not present

## 2023-12-14 ENCOUNTER — Ambulatory Visit: Payer: Self-pay | Admitting: Internal Medicine

## 2023-12-14 DIAGNOSIS — E1151 Type 2 diabetes mellitus with diabetic peripheral angiopathy without gangrene: Secondary | ICD-10-CM | POA: Diagnosis not present

## 2023-12-14 DIAGNOSIS — L03114 Cellulitis of left upper limb: Secondary | ICD-10-CM | POA: Diagnosis not present

## 2023-12-14 DIAGNOSIS — N2581 Secondary hyperparathyroidism of renal origin: Secondary | ICD-10-CM | POA: Diagnosis not present

## 2023-12-14 DIAGNOSIS — L02512 Cutaneous abscess of left hand: Secondary | ICD-10-CM | POA: Diagnosis not present

## 2023-12-14 DIAGNOSIS — I70203 Unspecified atherosclerosis of native arteries of extremities, bilateral legs: Secondary | ICD-10-CM | POA: Diagnosis not present

## 2023-12-14 DIAGNOSIS — B9561 Methicillin susceptible Staphylococcus aureus infection as the cause of diseases classified elsewhere: Secondary | ICD-10-CM | POA: Diagnosis not present

## 2023-12-14 DIAGNOSIS — E782 Mixed hyperlipidemia: Secondary | ICD-10-CM | POA: Diagnosis not present

## 2023-12-14 DIAGNOSIS — Z89511 Acquired absence of right leg below knee: Secondary | ICD-10-CM | POA: Diagnosis not present

## 2023-12-14 DIAGNOSIS — I132 Hypertensive heart and chronic kidney disease with heart failure and with stage 5 chronic kidney disease, or end stage renal disease: Secondary | ICD-10-CM | POA: Diagnosis not present

## 2023-12-14 DIAGNOSIS — K219 Gastro-esophageal reflux disease without esophagitis: Secondary | ICD-10-CM | POA: Diagnosis not present

## 2023-12-14 DIAGNOSIS — E1122 Type 2 diabetes mellitus with diabetic chronic kidney disease: Secondary | ICD-10-CM | POA: Diagnosis not present

## 2023-12-14 DIAGNOSIS — N186 End stage renal disease: Secondary | ICD-10-CM | POA: Diagnosis not present

## 2023-12-14 DIAGNOSIS — Z8673 Personal history of transient ischemic attack (TIA), and cerebral infarction without residual deficits: Secondary | ICD-10-CM | POA: Diagnosis not present

## 2023-12-14 DIAGNOSIS — T8742 Infection of amputation stump, left upper extremity: Secondary | ICD-10-CM | POA: Diagnosis not present

## 2023-12-14 DIAGNOSIS — I350 Nonrheumatic aortic (valve) stenosis: Secondary | ICD-10-CM | POA: Diagnosis not present

## 2023-12-14 DIAGNOSIS — G4733 Obstructive sleep apnea (adult) (pediatric): Secondary | ICD-10-CM | POA: Diagnosis not present

## 2023-12-14 DIAGNOSIS — I5032 Chronic diastolic (congestive) heart failure: Secondary | ICD-10-CM | POA: Diagnosis not present

## 2023-12-14 DIAGNOSIS — D631 Anemia in chronic kidney disease: Secondary | ICD-10-CM | POA: Diagnosis not present

## 2023-12-14 DIAGNOSIS — J9811 Atelectasis: Secondary | ICD-10-CM | POA: Diagnosis not present

## 2023-12-14 DIAGNOSIS — I480 Paroxysmal atrial fibrillation: Secondary | ICD-10-CM | POA: Diagnosis not present

## 2023-12-15 ENCOUNTER — Encounter (HOSPITAL_COMMUNITY): Payer: Self-pay

## 2023-12-15 ENCOUNTER — Encounter: Payer: Self-pay | Admitting: Internal Medicine

## 2023-12-15 ENCOUNTER — Inpatient Hospital Stay (HOSPITAL_COMMUNITY)
Admission: EM | Admit: 2023-12-15 | Discharge: 2023-12-18 | DRG: 377 | Disposition: A | Discharge status: Home / Self Care | Attending: Internal Medicine | Admitting: Internal Medicine

## 2023-12-15 ENCOUNTER — Other Ambulatory Visit: Payer: Self-pay

## 2023-12-15 DIAGNOSIS — D6832 Hemorrhagic disorder due to extrinsic circulating anticoagulants: Secondary | ICD-10-CM | POA: Diagnosis not present

## 2023-12-15 DIAGNOSIS — D128 Benign neoplasm of rectum: Secondary | ICD-10-CM | POA: Diagnosis present

## 2023-12-15 DIAGNOSIS — K922 Gastrointestinal hemorrhage, unspecified: Principal | ICD-10-CM | POA: Diagnosis present

## 2023-12-15 DIAGNOSIS — Z8673 Personal history of transient ischemic attack (TIA), and cerebral infarction without residual deficits: Secondary | ICD-10-CM

## 2023-12-15 DIAGNOSIS — Z83438 Family history of other disorder of lipoprotein metabolism and other lipidemia: Secondary | ICD-10-CM

## 2023-12-15 DIAGNOSIS — F39 Unspecified mood [affective] disorder: Secondary | ICD-10-CM | POA: Diagnosis present

## 2023-12-15 DIAGNOSIS — Z992 Dependence on renal dialysis: Secondary | ICD-10-CM

## 2023-12-15 DIAGNOSIS — T45515A Adverse effect of anticoagulants, initial encounter: Secondary | ICD-10-CM | POA: Diagnosis present

## 2023-12-15 DIAGNOSIS — K589 Irritable bowel syndrome without diarrhea: Secondary | ICD-10-CM | POA: Diagnosis present

## 2023-12-15 DIAGNOSIS — Z803 Family history of malignant neoplasm of breast: Secondary | ICD-10-CM

## 2023-12-15 DIAGNOSIS — Z7984 Long term (current) use of oral hypoglycemic drugs: Secondary | ICD-10-CM

## 2023-12-15 DIAGNOSIS — Z7901 Long term (current) use of anticoagulants: Secondary | ICD-10-CM

## 2023-12-15 DIAGNOSIS — R1310 Dysphagia, unspecified: Secondary | ICD-10-CM | POA: Diagnosis present

## 2023-12-15 DIAGNOSIS — E870 Hyperosmolality and hypernatremia: Secondary | ICD-10-CM | POA: Diagnosis not present

## 2023-12-15 DIAGNOSIS — E1151 Type 2 diabetes mellitus with diabetic peripheral angiopathy without gangrene: Secondary | ICD-10-CM | POA: Diagnosis not present

## 2023-12-15 DIAGNOSIS — I96 Gangrene, not elsewhere classified: Secondary | ICD-10-CM | POA: Diagnosis not present

## 2023-12-15 DIAGNOSIS — K219 Gastro-esophageal reflux disease without esophagitis: Secondary | ICD-10-CM | POA: Diagnosis present

## 2023-12-15 DIAGNOSIS — L02512 Cutaneous abscess of left hand: Secondary | ICD-10-CM | POA: Diagnosis not present

## 2023-12-15 DIAGNOSIS — N2889 Other specified disorders of kidney and ureter: Secondary | ICD-10-CM

## 2023-12-15 DIAGNOSIS — Z8701 Personal history of pneumonia (recurrent): Secondary | ICD-10-CM

## 2023-12-15 DIAGNOSIS — I132 Hypertensive heart and chronic kidney disease with heart failure and with stage 5 chronic kidney disease, or end stage renal disease: Secondary | ICD-10-CM | POA: Diagnosis present

## 2023-12-15 DIAGNOSIS — K641 Second degree hemorrhoids: Secondary | ICD-10-CM | POA: Diagnosis present

## 2023-12-15 DIAGNOSIS — F40240 Claustrophobia: Secondary | ICD-10-CM | POA: Diagnosis present

## 2023-12-15 DIAGNOSIS — K6389 Other specified diseases of intestine: Secondary | ICD-10-CM | POA: Diagnosis present

## 2023-12-15 DIAGNOSIS — D62 Acute posthemorrhagic anemia: Secondary | ICD-10-CM | POA: Diagnosis present

## 2023-12-15 DIAGNOSIS — Z7982 Long term (current) use of aspirin: Secondary | ICD-10-CM

## 2023-12-15 DIAGNOSIS — K2970 Gastritis, unspecified, without bleeding: Secondary | ICD-10-CM | POA: Diagnosis present

## 2023-12-15 DIAGNOSIS — E1169 Type 2 diabetes mellitus with other specified complication: Secondary | ICD-10-CM | POA: Diagnosis not present

## 2023-12-15 DIAGNOSIS — E1152 Type 2 diabetes mellitus with diabetic peripheral angiopathy with gangrene: Secondary | ICD-10-CM | POA: Diagnosis not present

## 2023-12-15 DIAGNOSIS — B9561 Methicillin susceptible Staphylococcus aureus infection as the cause of diseases classified elsewhere: Secondary | ICD-10-CM | POA: Diagnosis not present

## 2023-12-15 DIAGNOSIS — N2581 Secondary hyperparathyroidism of renal origin: Secondary | ICD-10-CM | POA: Diagnosis not present

## 2023-12-15 DIAGNOSIS — Z89512 Acquired absence of left leg below knee: Secondary | ICD-10-CM

## 2023-12-15 DIAGNOSIS — Z8249 Family history of ischemic heart disease and other diseases of the circulatory system: Secondary | ICD-10-CM

## 2023-12-15 DIAGNOSIS — D631 Anemia in chronic kidney disease: Secondary | ICD-10-CM | POA: Diagnosis not present

## 2023-12-15 DIAGNOSIS — N186 End stage renal disease: Secondary | ICD-10-CM | POA: Diagnosis present

## 2023-12-15 DIAGNOSIS — M898X9 Other specified disorders of bone, unspecified site: Secondary | ICD-10-CM | POA: Diagnosis present

## 2023-12-15 DIAGNOSIS — E871 Hypo-osmolality and hyponatremia: Secondary | ICD-10-CM | POA: Diagnosis not present

## 2023-12-15 DIAGNOSIS — Z91199 Patient's noncompliance with other medical treatment and regimen due to unspecified reason: Secondary | ICD-10-CM

## 2023-12-15 DIAGNOSIS — I5032 Chronic diastolic (congestive) heart failure: Secondary | ICD-10-CM | POA: Diagnosis present

## 2023-12-15 DIAGNOSIS — D123 Benign neoplasm of transverse colon: Secondary | ICD-10-CM | POA: Diagnosis present

## 2023-12-15 DIAGNOSIS — Z90710 Acquired absence of both cervix and uterus: Secondary | ICD-10-CM

## 2023-12-15 DIAGNOSIS — T8742 Infection of amputation stump, left upper extremity: Secondary | ICD-10-CM | POA: Diagnosis not present

## 2023-12-15 DIAGNOSIS — F32A Depression, unspecified: Secondary | ICD-10-CM | POA: Diagnosis present

## 2023-12-15 DIAGNOSIS — G4733 Obstructive sleep apnea (adult) (pediatric): Secondary | ICD-10-CM | POA: Diagnosis present

## 2023-12-15 DIAGNOSIS — Z794 Long term (current) use of insulin: Secondary | ICD-10-CM

## 2023-12-15 DIAGNOSIS — D649 Anemia, unspecified: Secondary | ICD-10-CM | POA: Diagnosis present

## 2023-12-15 DIAGNOSIS — Y838 Other surgical procedures as the cause of abnormal reaction of the patient, or of later complication, without mention of misadventure at the time of the procedure: Secondary | ICD-10-CM | POA: Diagnosis present

## 2023-12-15 DIAGNOSIS — I10 Essential (primary) hypertension: Secondary | ICD-10-CM | POA: Diagnosis not present

## 2023-12-15 DIAGNOSIS — R519 Headache, unspecified: Secondary | ICD-10-CM | POA: Diagnosis present

## 2023-12-15 DIAGNOSIS — E782 Mixed hyperlipidemia: Secondary | ICD-10-CM | POA: Diagnosis present

## 2023-12-15 DIAGNOSIS — I480 Paroxysmal atrial fibrillation: Secondary | ICD-10-CM | POA: Diagnosis present

## 2023-12-15 DIAGNOSIS — Z888 Allergy status to other drugs, medicaments and biological substances status: Secondary | ICD-10-CM

## 2023-12-15 DIAGNOSIS — E860 Dehydration: Secondary | ICD-10-CM | POA: Diagnosis not present

## 2023-12-15 DIAGNOSIS — E875 Hyperkalemia: Secondary | ICD-10-CM | POA: Diagnosis present

## 2023-12-15 DIAGNOSIS — E1122 Type 2 diabetes mellitus with diabetic chronic kidney disease: Secondary | ICD-10-CM | POA: Diagnosis not present

## 2023-12-15 DIAGNOSIS — Z89511 Acquired absence of right leg below knee: Secondary | ICD-10-CM | POA: Diagnosis not present

## 2023-12-15 DIAGNOSIS — Z880 Allergy status to penicillin: Secondary | ICD-10-CM

## 2023-12-15 DIAGNOSIS — D124 Benign neoplasm of descending colon: Secondary | ICD-10-CM | POA: Diagnosis present

## 2023-12-15 DIAGNOSIS — Z8261 Family history of arthritis: Secondary | ICD-10-CM

## 2023-12-15 DIAGNOSIS — L03114 Cellulitis of left upper limb: Secondary | ICD-10-CM | POA: Diagnosis not present

## 2023-12-15 DIAGNOSIS — K5791 Diverticulosis of intestine, part unspecified, without perforation or abscess with bleeding: Principal | ICD-10-CM | POA: Diagnosis present

## 2023-12-15 DIAGNOSIS — J9811 Atelectasis: Secondary | ICD-10-CM | POA: Diagnosis not present

## 2023-12-15 DIAGNOSIS — H9190 Unspecified hearing loss, unspecified ear: Secondary | ICD-10-CM | POA: Diagnosis present

## 2023-12-15 DIAGNOSIS — K644 Residual hemorrhoidal skin tags: Secondary | ICD-10-CM | POA: Diagnosis present

## 2023-12-15 DIAGNOSIS — I350 Nonrheumatic aortic (valve) stenosis: Secondary | ICD-10-CM | POA: Diagnosis not present

## 2023-12-15 DIAGNOSIS — Z8616 Personal history of COVID-19: Secondary | ICD-10-CM

## 2023-12-15 DIAGNOSIS — Z79899 Other long term (current) drug therapy: Secondary | ICD-10-CM

## 2023-12-15 DIAGNOSIS — D125 Benign neoplasm of sigmoid colon: Secondary | ICD-10-CM | POA: Diagnosis present

## 2023-12-15 DIAGNOSIS — I70203 Unspecified atherosclerosis of native arteries of extremities, bilateral legs: Secondary | ICD-10-CM | POA: Diagnosis not present

## 2023-12-15 DIAGNOSIS — Z723 Lack of physical exercise: Secondary | ICD-10-CM

## 2023-12-15 DIAGNOSIS — K921 Melena: Secondary | ICD-10-CM | POA: Diagnosis present

## 2023-12-15 DIAGNOSIS — K91840 Postprocedural hemorrhage and hematoma of a digestive system organ or structure following a digestive system procedure: Secondary | ICD-10-CM | POA: Diagnosis present

## 2023-12-15 LAB — COMPREHENSIVE METABOLIC PANEL WITH GFR
ALT: <5 U/L (ref 0–44)
AST: 13 U/L — ABNORMAL LOW (ref 15–41)
Albumin: 2.4 g/dL — ABNORMAL LOW (ref 3.5–5.0)
Alkaline Phosphatase: 54 U/L (ref 38–126)
Anion gap: 14 (ref 5–15)
BUN: 39 mg/dL — ABNORMAL HIGH (ref 8–23)
CO2: 27 mmol/L (ref 22–32)
Calcium: 8.2 mg/dL — ABNORMAL LOW (ref 8.9–10.3)
Chloride: 105 mmol/L (ref 98–111)
Creatinine, Ser: 3.97 mg/dL — ABNORMAL HIGH (ref 0.44–1.00)
GFR, Estimated: 12 mL/min — ABNORMAL LOW (ref >60)
Glucose, Bld: 260 mg/dL — ABNORMAL HIGH (ref 70–99)
Potassium: 4.1 mmol/L (ref 3.5–5.1)
Sodium: 146 mmol/L — ABNORMAL HIGH (ref 135–145)
Total Bilirubin: 0.5 mg/dL (ref 0.0–1.2)
Total Protein: 5 g/dL — ABNORMAL LOW (ref 6.5–8.1)

## 2023-12-15 LAB — CBC
HCT: 18 % — ABNORMAL LOW (ref 36.0–46.0)
Hemoglobin: 5.3 g/dL — CL (ref 12.0–15.0)
MCH: 25.2 pg — ABNORMAL LOW (ref 26.0–34.0)
MCHC: 29.4 g/dL — ABNORMAL LOW (ref 30.0–36.0)
MCV: 85.7 fL (ref 80.0–100.0)
Platelets: 259 K/uL (ref 150–400)
RBC: 2.1 MIL/uL — ABNORMAL LOW (ref 3.87–5.11)
RDW: 18.5 % — ABNORMAL HIGH (ref 11.5–15.5)
WBC: 9 K/uL (ref 4.0–10.5)
nRBC: 0.4 % — ABNORMAL HIGH (ref 0.0–0.2)

## 2023-12-15 LAB — PROTIME-INR
INR: 1.2 (ref 0.8–1.2)
Prothrombin Time: 15.4 s — ABNORMAL HIGH (ref 11.4–15.2)

## 2023-12-15 LAB — PREPARE RBC (CROSSMATCH)

## 2023-12-15 LAB — LACTIC ACID, PLASMA: Lactic Acid, Venous: 1.9 mmol/L (ref 0.5–1.9)

## 2023-12-15 MED ORDER — PANTOPRAZOLE SODIUM 40 MG IV SOLR
40.0000 mg | Freq: Two times a day (BID) | INTRAVENOUS | Status: DC
  Administered 2023-12-16 – 2023-12-18 (×5): 40 mg via INTRAVENOUS
  Filled 2023-12-15 (×5): qty 10

## 2023-12-15 MED ORDER — ONDANSETRON HCL 4 MG/2ML IJ SOLN
4.0000 mg | Freq: Three times a day (TID) | INTRAMUSCULAR | Status: DC | PRN
  Administered 2023-12-15 – 2023-12-16 (×2): 4 mg via INTRAVENOUS
  Filled 2023-12-15 (×2): qty 2

## 2023-12-15 MED ORDER — SODIUM CHLORIDE 0.9% IV SOLUTION: Freq: Once | INTRAVENOUS | Status: DC

## 2023-12-15 MED ORDER — PANTOPRAZOLE SODIUM 40 MG IV SOLR
40.0000 mg | INTRAVENOUS | Status: AC
  Administered 2023-12-15 (×2): 40 mg via INTRAVENOUS
  Filled 2023-12-15 (×2): qty 10

## 2023-12-15 NOTE — ED Notes (Signed)
 Pt had a moderate size dark red bloody BM. Peri care performed and new brief applied.

## 2023-12-15 NOTE — ED Provider Notes (Signed)
 [place] [facility]GENC[facility] DEPARTMENT AT [facility] Provider Note   CSN: [id number] Arrival date & time: 12/15/23  2025     History {Add pertinent medical, surgical, social history, OB history to HPI:1} Chief Complaint  Patient presents with   Rectal Bleeding    [name] is a 68 y.o. female with ESRD on hemodialysis MWF, A-fib on apixaban , HTN, HLD, discoid lupus, OSA, T2DM, irritable bowel syndrome, history of bilateral BKA, PVD, history of CVA, depression/GAD, bilateral adrenal adenoma, chronic HFpEF, aortic stenosis hearing loss who presents with BIBEMS from home. Cc of of rectal bleeding.   Per chart review patient was recently mated from 12/01/2023 to 12/07/2023  Ems called out for vaginal bleeding but reports stool has been dark for 3 days.  Has recent colonoscopy and was told she has internal hemorrhoids  Was at dialysis today and had LOC- unsure how much of treatment was completed.  With ems-- 139/59 hr 84. Cbg 260 after drinking a milk shake. Also complains of 5/10 pain in left hand .    Past Medical History:  Diagnosis Date   Acid reflux    Amputated left leg (HCC)    bilateral BKA   Anemia    Arthritis    Axillary masses    Soft tissue - status post excision   Back pain    CHF (congestive heart failure) (HCC)    COVID-19 virus infection 04/06/2019   Depression    End-stage renal disease (HCC)    M/W/F dialysis   Essential hypertension    Headache    years ago   History of blood transfusion    History of cardiac catheterization    Normal coronary arteries October 2020   History of claustrophobia    History of pneumonia 2019   Hypoxia 04/03/2019   Memory loss    Mixed hyperlipidemia    Obesity    Pancreatitis    Peritoneal dialysis catheter in place    Pneumonia due to COVID-19 virus 04/02/2019   Sleep apnea    Noncompliant with CPAP   Stroke (HCC)    mini stroke   Type 2 diabetes mellitus (HCC)        Home Medications Prior to  Admission medications   Medication Sig Start Date End Date Taking? Authorizing Provider  acetaminophen  (TYLENOL ) 500 MG tablet Take 1,000 mg by mouth every 6 (six) hours as needed for mild pain (pain score 1-3).    [provider]  amLODipine  (NORVASC ) 10 MG tablet Take 0.5 tablets (5 mg total) by mouth daily. 10/28/23   Pearlean Manus, MD  apixaban  (ELIQUIS ) 5 MG TABS tablet Take 1 tablet (5 mg total) by mouth 2 (two) times daily. For Afib 10/29/23   Pearlean Manus, MD  aspirin  81 MG chewable tablet Chew 2 tablets (162 mg total) by mouth daily. 09/16/23   Bevely Doffing, FNP  B Complex-C-Folic Acid  (RENA-VITE RX) 1 MG TABS Take 1 tablet by mouth daily. 04/15/22   [provider]  busPIRone  (BUSPAR ) 7.5 MG tablet Take 1 tablet (7.5 mg total) by mouth 2 (two) times daily. 11/24/23   Antonetta Rollene BRAVO, MD  calcitRIOL  (ROCALTROL ) 0.5 MCG capsule Take 2 capsules (1 mcg total) by mouth every Monday, Wednesday, and Friday with hemodialysis. 11/05/23   Caleen Burgess BROCKS, MD  CEFAZOLIN  SODIUM IV Inject into the vein every Monday, Wednesday, and Friday with hemodialysis. 11/30/23 01/11/24  [provider]  cinacalcet  (SENSIPAR ) 30 MG tablet Take 3 tablets (90 mg total)  by mouth every Monday, Wednesday, and Friday. 12/08/23   Vicci Afton CROME, MD  Darbepoetin Alfa  (ARANESP ) 40 MCG/0.4ML SOSY injection Inject 0.4 mLs (40 mcg total) into the skin every Friday at 6 PM. Patient taking differently: Inject 40 mcg into the vein every Friday at 6 PM. 11/05/23   Amin, Ankit C, MD  ezetimibe  (ZETIA ) 10 MG tablet Take 1 tablet (10 mg total) by mouth daily. 04/27/23   Antonetta Rollene BRAVO, MD  insulin  aspart (NOVOLOG ) 100 UNIT/ML injection Inject 0-6 Units into the skin 3 (three) times daily with meals. CBG 70 - 120: 0 units  CBG 121 - 150: 0 units  CBG 151 - 200: 1 unit  CBG 201-250: 2 units  CBG 251-300: 3 units  CBG 301-350: 4 units  CBG 351-400: 5 units  CBG > 400: Give 10 units and call MD  11/12/23   Shahmehdi, Seyed A, MD  insulin  glargine, 2 Unit Dial , (TOUJEO  MAX SOLOSTAR) 300 UNIT/ML Solostar Pen Inject 30 Units into the skin daily. May need to slowly increase the dose depending upon your blood sugar, follow-up with PCP 01/01/23   Trixie File, MD  metoprolol  succinate (TOPROL -XL) 50 MG 24 hr tablet TAKE (1) TABLET BY MOUTH DAILY WITH FOOD *TAKE AFTER DIALYSIS* Patient taking differently: Take 50 mg by mouth every Tuesday, Thursday, Saturday, and Sunday. Non-dialysis days 01/05/23   Antonetta Rollene BRAVO, MD  ondansetron  (ZOFRAN ) 4 MG tablet Take 4 mg by mouth every 8 (eight) hours as needed for nausea or vomiting. 11/26/23   [provider]  pantoprazole  (PROTONIX ) 40 MG tablet Take 1 tablet (40 mg total) by mouth daily. 12/08/23   Johnson, Clanford L, MD  sertraline  (ZOLOFT ) 50 MG tablet Take one and a half tablets once daily Patient taking differently: Take 75 mg by mouth daily. Take one and a half tablets once daily 11/24/23   Antonetta Rollene BRAVO, MD  torsemide  (DEMADEX ) 100 MG tablet Take 1 tablet (100 mg total) by mouth every Tuesday, Thursday, Saturday, and Sunday. Patient taking differently: Take 100 mg by mouth every Tuesday, Thursday, Saturday, and Sunday. Non-dialysis days 10/28/23   Pearlean Manus, MD  [name]  500 MG chewable tablet Chew 1-2 tablets (500-1,000 mg total) by mouth See admin instructions. Take 1000mg  (2 tablets) by mouth with meals and 500mg  (1 tablet) with snacks 08/23/23   Antonetta Rollene BRAVO, MD  FLUoxetine (PROZAC) 10 MG capsule Take 10 mg by mouth daily.    05/28/11  [provider]  glipiZIDE  (GLUCOTROL ) 10 MG tablet Take 10 mg by mouth 2 (two) times daily before a meal.    05/28/11  [provider]      Allergies    Ace inhibitors, Penicillins, Statins, and Albuterol     Review of Systems   Review of Systems A 10 point review of systems was performed and is negative unless otherwise reported in HPI.  Physical Exam Updated  Vital Signs BP 134/60 (BP Location: Right Arm)   Pulse 78   Temp 98.8 F (37.1 C)   Resp 13   Ht 5' 4 (1.626 m)   Wt 72.7 kg   SpO2 94%   BMI 27.51 kg/m  Physical Exam General: Normal appearing {Desc; female/female:11659}, lying in bed.  HEENT: PERRLA, Sclera anicteric, MMM, trachea midline.  Cardiology: RRR, no murmurs/rubs/gallops. BL radial and DP pulses equal bilaterally.  Resp: Normal respiratory rate and effort. CTAB, no wheezes, rhonchi, crackles.  Abd: Soft, non-tender, non-distended. No rebound tenderness or guarding.  GU: Deferred.  MSK: No peripheral edema or signs of trauma. Extremities without deformity or TTP. No cyanosis or clubbing. Skin: warm, dry. No rashes or lesions. Back: No CVA tenderness Neuro: A&Ox4, CNs II-XII grossly intact. MAEs. Sensation grossly intact.  Psych: Normal mood and affect.   ED Results / Procedures / Treatments   Labs (all labs ordered are listed, but only abnormal results are displayed) Labs Reviewed  COMPREHENSIVE METABOLIC PANEL WITH GFR  CBC  POC OCCULT BLOOD, ED  TYPE AND SCREEN    EKG None  Radiology No results found.  Procedures Procedures  {Document cardiac monitor, telemetry assessment procedure when appropriate:1}  Medications Ordered in ED Medications - No data to display  ED Course/ Medical Decision Making/ A&P                          Medical Decision Making Amount and/or Complexity of Data Reviewed Labs: ordered.    This patient presents to the ED for concern of ***, this involves an extensive number of treatment options, and is a complaint that carries with it a high risk of complications and morbidity.  I considered the following differential and admission for this acute, potentially life threatening condition.   MDM:    ***     Labs: I Ordered, and personally interpreted labs.  The pertinent results include:  ***  Imaging Studies ordered: I ordered imaging studies including *** I independently  visualized and interpreted imaging. I agree with the radiologist interpretation  Additional history obtained from ***.  External records from outside source obtained and reviewed including ***  Cardiac Monitoring: The patient was maintained on a cardiac monitor.  I personally viewed and interpreted the cardiac monitored which showed an underlying rhythm of: ***  Reevaluation: After the interventions noted above, I reevaluated the patient and found that they have :{resolved/improved/worsened:23923::improved}  Social Determinants of Health: ***  Disposition:  ***  Co morbidities that complicate the patient evaluation  Past Medical History:  Diagnosis Date   Acid reflux    Amputated left leg (HCC)    bilateral BKA   Anemia    Arthritis    Axillary masses    Soft tissue - status post excision   Back pain    CHF (congestive heart failure) (HCC)    COVID-19 virus infection 04/06/2019   Depression    End-stage renal disease (HCC)    M/W/F dialysis   Essential hypertension    Headache    years ago   History of blood transfusion    History of cardiac catheterization    Normal coronary arteries October 2020   History of claustrophobia    History of pneumonia 2019   Hypoxia 04/03/2019   Memory loss    Mixed hyperlipidemia    Obesity    Pancreatitis    Peritoneal dialysis catheter in place    Pneumonia due to COVID-19 virus 04/02/2019   Sleep apnea    Noncompliant with CPAP   Stroke (HCC)    mini stroke   Type 2 diabetes mellitus (HCC)      Medicines No orders of the defined types were placed in this encounter.   I have reviewed the patients home medicines and have made adjustments as needed  Problem List / ED Course: Problem List Items Addressed This Visit   None        {Document critical care time when appropriate:1} {Document review of labs and clinical decision tools ie heart score, Chads2Vasc2 etc:1}  {  Document your independent review of radiology  images, and any outside records:1} {Document your discussion with family members, caretakers, and with consultants:1} {Document social determinants of health affecting pt's care:1} {Document your decision making why or why not admission, treatments were needed:1}  This note was created using dictation software, which may contain spelling or grammatical errors.

## 2023-12-15 NOTE — Telephone Encounter (Signed)
 FYI:  Phoned the pt's daughter back and spoke with her daughter. Was advised that the pt still had blood in her BM but not the stool. I asked was it a lot and she stated it was about the same as before the colonoscopy. I advised her if it starts to be more and into the stool to call back over to Day Surgery because pt has not been seen in the office. Understanding was expressed.   Sent Dr Cindie a message in secure chat @ 4:46pm.

## 2023-12-15 NOTE — ED Triage Notes (Signed)
 Ccems form home. Cc of of rectal bleeding. Ems called out for vaginal bleeding but reports stool has been dark for 3 days.  Has recent colonoscopy and was told she has internal hemorrhoids  Was at dialysis today and had LOC- unsure how much of treatment was completed.  With ems-- 139/59 hr 84. Cbg 260 after drinking a milk shake 5/10 pain in left hand

## 2023-12-16 ENCOUNTER — Inpatient Hospital Stay (HOSPITAL_COMMUNITY)

## 2023-12-16 ENCOUNTER — Ambulatory Visit (HOSPITAL_COMMUNITY): Admitting: Occupational Therapy

## 2023-12-16 DIAGNOSIS — N2889 Other specified disorders of kidney and ureter: Secondary | ICD-10-CM | POA: Diagnosis not present

## 2023-12-16 DIAGNOSIS — K641 Second degree hemorrhoids: Secondary | ICD-10-CM | POA: Diagnosis not present

## 2023-12-16 DIAGNOSIS — D62 Acute posthemorrhagic anemia: Secondary | ICD-10-CM | POA: Diagnosis not present

## 2023-12-16 DIAGNOSIS — Y838 Other surgical procedures as the cause of abnormal reaction of the patient, or of later complication, without mention of misadventure at the time of the procedure: Secondary | ICD-10-CM | POA: Diagnosis present

## 2023-12-16 DIAGNOSIS — K635 Polyp of colon: Secondary | ICD-10-CM | POA: Diagnosis not present

## 2023-12-16 DIAGNOSIS — D649 Anemia, unspecified: Secondary | ICD-10-CM

## 2023-12-16 DIAGNOSIS — Z89512 Acquired absence of left leg below knee: Secondary | ICD-10-CM | POA: Diagnosis not present

## 2023-12-16 DIAGNOSIS — D128 Benign neoplasm of rectum: Secondary | ICD-10-CM | POA: Diagnosis not present

## 2023-12-16 DIAGNOSIS — K922 Gastrointestinal hemorrhage, unspecified: Secondary | ICD-10-CM | POA: Diagnosis not present

## 2023-12-16 DIAGNOSIS — K6389 Other specified diseases of intestine: Secondary | ICD-10-CM | POA: Diagnosis not present

## 2023-12-16 DIAGNOSIS — F32A Depression, unspecified: Secondary | ICD-10-CM | POA: Diagnosis present

## 2023-12-16 DIAGNOSIS — Z8616 Personal history of COVID-19: Secondary | ICD-10-CM | POA: Diagnosis not present

## 2023-12-16 DIAGNOSIS — S61402A Unspecified open wound of left hand, initial encounter: Secondary | ICD-10-CM | POA: Diagnosis not present

## 2023-12-16 DIAGNOSIS — I96 Gangrene, not elsewhere classified: Secondary | ICD-10-CM | POA: Diagnosis present

## 2023-12-16 DIAGNOSIS — I132 Hypertensive heart and chronic kidney disease with heart failure and with stage 5 chronic kidney disease, or end stage renal disease: Secondary | ICD-10-CM | POA: Diagnosis not present

## 2023-12-16 DIAGNOSIS — E860 Dehydration: Secondary | ICD-10-CM | POA: Diagnosis present

## 2023-12-16 DIAGNOSIS — E871 Hypo-osmolality and hyponatremia: Secondary | ICD-10-CM | POA: Diagnosis present

## 2023-12-16 DIAGNOSIS — K921 Melena: Secondary | ICD-10-CM | POA: Diagnosis not present

## 2023-12-16 DIAGNOSIS — N250 Renal osteodystrophy: Secondary | ICD-10-CM | POA: Diagnosis not present

## 2023-12-16 DIAGNOSIS — Z89511 Acquired absence of right leg below knee: Secondary | ICD-10-CM | POA: Diagnosis not present

## 2023-12-16 DIAGNOSIS — N186 End stage renal disease: Secondary | ICD-10-CM | POA: Diagnosis not present

## 2023-12-16 DIAGNOSIS — Z7901 Long term (current) use of anticoagulants: Secondary | ICD-10-CM

## 2023-12-16 DIAGNOSIS — Z794 Long term (current) use of insulin: Secondary | ICD-10-CM | POA: Diagnosis not present

## 2023-12-16 DIAGNOSIS — D125 Benign neoplasm of sigmoid colon: Secondary | ICD-10-CM | POA: Diagnosis not present

## 2023-12-16 DIAGNOSIS — K5730 Diverticulosis of large intestine without perforation or abscess without bleeding: Secondary | ICD-10-CM | POA: Diagnosis not present

## 2023-12-16 DIAGNOSIS — E1152 Type 2 diabetes mellitus with diabetic peripheral angiopathy with gangrene: Secondary | ICD-10-CM | POA: Diagnosis present

## 2023-12-16 DIAGNOSIS — I701 Atherosclerosis of renal artery: Secondary | ICD-10-CM | POA: Diagnosis not present

## 2023-12-16 DIAGNOSIS — D124 Benign neoplasm of descending colon: Secondary | ICD-10-CM | POA: Diagnosis not present

## 2023-12-16 DIAGNOSIS — K625 Hemorrhage of anus and rectum: Secondary | ICD-10-CM

## 2023-12-16 DIAGNOSIS — E119 Type 2 diabetes mellitus without complications: Secondary | ICD-10-CM | POA: Diagnosis not present

## 2023-12-16 DIAGNOSIS — K91840 Postprocedural hemorrhage and hematoma of a digestive system organ or structure following a digestive system procedure: Secondary | ICD-10-CM | POA: Diagnosis not present

## 2023-12-16 DIAGNOSIS — N281 Cyst of kidney, acquired: Secondary | ICD-10-CM | POA: Diagnosis not present

## 2023-12-16 DIAGNOSIS — K920 Hematemesis: Secondary | ICD-10-CM | POA: Diagnosis not present

## 2023-12-16 DIAGNOSIS — E870 Hyperosmolality and hypernatremia: Secondary | ICD-10-CM | POA: Diagnosis not present

## 2023-12-16 DIAGNOSIS — E278 Other specified disorders of adrenal gland: Secondary | ICD-10-CM | POA: Diagnosis not present

## 2023-12-16 DIAGNOSIS — D6832 Hemorrhagic disorder due to extrinsic circulating anticoagulants: Secondary | ICD-10-CM | POA: Diagnosis present

## 2023-12-16 DIAGNOSIS — I5032 Chronic diastolic (congestive) heart failure: Secondary | ICD-10-CM | POA: Diagnosis not present

## 2023-12-16 DIAGNOSIS — Z992 Dependence on renal dialysis: Secondary | ICD-10-CM | POA: Diagnosis not present

## 2023-12-16 DIAGNOSIS — I120 Hypertensive chronic kidney disease with stage 5 chronic kidney disease or end stage renal disease: Secondary | ICD-10-CM | POA: Diagnosis not present

## 2023-12-16 DIAGNOSIS — F39 Unspecified mood [affective] disorder: Secondary | ICD-10-CM | POA: Diagnosis present

## 2023-12-16 DIAGNOSIS — I480 Paroxysmal atrial fibrillation: Secondary | ICD-10-CM | POA: Diagnosis present

## 2023-12-16 DIAGNOSIS — E1122 Type 2 diabetes mellitus with diabetic chronic kidney disease: Secondary | ICD-10-CM | POA: Diagnosis not present

## 2023-12-16 DIAGNOSIS — D123 Benign neoplasm of transverse colon: Secondary | ICD-10-CM | POA: Diagnosis not present

## 2023-12-16 DIAGNOSIS — D631 Anemia in chronic kidney disease: Secondary | ICD-10-CM | POA: Diagnosis not present

## 2023-12-16 DIAGNOSIS — E1169 Type 2 diabetes mellitus with other specified complication: Secondary | ICD-10-CM | POA: Diagnosis present

## 2023-12-16 DIAGNOSIS — I350 Nonrheumatic aortic (valve) stenosis: Secondary | ICD-10-CM | POA: Diagnosis present

## 2023-12-16 DIAGNOSIS — K5791 Diverticulosis of intestine, part unspecified, without perforation or abscess with bleeding: Secondary | ICD-10-CM | POA: Diagnosis present

## 2023-12-16 LAB — CBC
HCT: 27.9 % — ABNORMAL LOW (ref 36.0–46.0)
Hemoglobin: 9.2 g/dL — ABNORMAL LOW (ref 12.0–15.0)
MCH: 28 pg (ref 26.0–34.0)
MCHC: 33 g/dL (ref 30.0–36.0)
MCV: 84.8 fL (ref 80.0–100.0)
Platelets: 235 K/uL (ref 150–400)
RBC: 3.29 MIL/uL — ABNORMAL LOW (ref 3.87–5.11)
RDW: 15.9 % — ABNORMAL HIGH (ref 11.5–15.5)
WBC: 12.4 K/uL — ABNORMAL HIGH (ref 4.0–10.5)
nRBC: 0.3 % — ABNORMAL HIGH (ref 0.0–0.2)

## 2023-12-16 LAB — HEMOGLOBIN AND HEMATOCRIT, BLOOD
HCT: 27.8 % — ABNORMAL LOW (ref 36.0–46.0)
Hemoglobin: 9.2 g/dL — ABNORMAL LOW (ref 12.0–15.0)

## 2023-12-16 LAB — BASIC METABOLIC PANEL WITH GFR
Anion gap: 16 — ABNORMAL HIGH (ref 5–15)
BUN: 47 mg/dL — ABNORMAL HIGH (ref 8–23)
CO2: 24 mmol/L (ref 22–32)
Calcium: 7.8 mg/dL — ABNORMAL LOW (ref 8.9–10.3)
Chloride: 105 mmol/L (ref 98–111)
Creatinine, Ser: 4.78 mg/dL — ABNORMAL HIGH (ref 0.44–1.00)
GFR, Estimated: 9 mL/min — ABNORMAL LOW (ref >60)
Glucose, Bld: 108 mg/dL — ABNORMAL HIGH (ref 70–99)
Potassium: 5.1 mmol/L (ref 3.5–5.1)
Sodium: 145 mmol/L (ref 135–145)

## 2023-12-16 LAB — PHOSPHORUS: Phosphorus: 5.3 mg/dL — ABNORMAL HIGH (ref 2.5–4.6)

## 2023-12-16 LAB — GLUCOSE, CAPILLARY
Glucose-Capillary: 109 mg/dL — ABNORMAL HIGH (ref 70–99)
Glucose-Capillary: 91 mg/dL (ref 70–99)
Glucose-Capillary: 109 mg/dL — ABNORMAL HIGH (ref 70–99)

## 2023-12-16 LAB — CBG MONITORING, ED
Glucose-Capillary: 164 mg/dL — ABNORMAL HIGH (ref 70–99)
Glucose-Capillary: 146 mg/dL — ABNORMAL HIGH (ref 70–99)

## 2023-12-16 LAB — HEPATITIS B SURFACE ANTIGEN: Hepatitis B Surface Ag: NONREACTIVE

## 2023-12-16 LAB — MRSA NEXT GEN BY PCR, NASAL: MRSA by PCR Next Gen: NOT DETECTED

## 2023-12-16 LAB — MAGNESIUM: Magnesium: 1.9 mg/dL (ref 1.7–2.4)

## 2023-12-16 MED ORDER — CEFAZOLIN SODIUM-DEXTROSE 2-4 GM/100ML-% IV SOLN: 2.0000 g | INTRAVENOUS | Status: DC

## 2023-12-16 MED ORDER — SODIUM CHLORIDE 0.9% FLUSH
3.0000 mL | Freq: Two times a day (BID) | INTRAVENOUS | Status: DC
  Administered 2023-12-16 – 2023-12-18 (×5): 3 mL via INTRAVENOUS

## 2023-12-16 MED ORDER — INSULIN ASPART 100 UNIT/ML IJ SOLN: 0.0000 [IU] | Freq: Three times a day (TID) | INTRAMUSCULAR | Status: DC

## 2023-12-16 MED ORDER — PEG 3350-KCL-NA BICARB-NACL 420 G PO SOLR
4000.0000 mL | Freq: Once | ORAL | Status: AC
  Administered 2023-12-16: 4000 mL via ORAL

## 2023-12-16 MED ORDER — METOPROLOL SUCCINATE ER 50 MG PO TB24
50.0000 mg | ORAL_TABLET | ORAL | Status: DC
  Administered 2023-12-16 – 2023-12-18 (×2): 50 mg via ORAL
  Filled 2023-12-16 (×2): qty 1

## 2023-12-16 MED ORDER — SODIUM CHLORIDE 0.9 % IV SOLN: INTRAVENOUS | Status: DC

## 2023-12-16 MED ORDER — EZETIMIBE 10 MG PO TABS
10.0000 mg | ORAL_TABLET | Freq: Every day | ORAL | Status: DC
  Administered 2023-12-16 – 2023-12-18 (×3): 10 mg via ORAL
  Filled 2023-12-16 (×3): qty 1

## 2023-12-16 MED ORDER — INSULIN GLARGINE 100 UNIT/ML ~~LOC~~ SOLN: 30.0000 [IU] | Freq: Every day | SUBCUTANEOUS | Status: DC

## 2023-12-16 MED ORDER — CHLORHEXIDINE GLUCONATE CLOTH 2 % EX PADS
6.0000 | MEDICATED_PAD | Freq: Every day | CUTANEOUS | Status: DC
  Administered 2023-12-16 – 2023-12-18 (×3): 6 via TOPICAL

## 2023-12-16 MED ORDER — CINACALCET HCL 30 MG PO TABS
90.0000 mg | ORAL_TABLET | ORAL | Status: DC
  Administered 2023-12-17: 90 mg via ORAL
  Filled 2023-12-16: qty 3

## 2023-12-16 MED ORDER — TORSEMIDE 20 MG PO TABS
100.0000 mg | ORAL_TABLET | ORAL | Status: DC
  Administered 2023-12-16 – 2023-12-18 (×2): 100 mg via ORAL
  Filled 2023-12-16 (×3): qty 5

## 2023-12-16 MED ORDER — SUCROFERRIC OXYHYDROXIDE 500 MG PO CHEW
500.0000 mg | CHEWABLE_TABLET | ORAL | Status: DC
  Filled 2023-12-16 (×10): qty 1

## 2023-12-16 MED ORDER — SERTRALINE HCL 50 MG PO TABS
75.0000 mg | ORAL_TABLET | Freq: Every day | ORAL | Status: DC
  Administered 2023-12-16 – 2023-12-18 (×3): 75 mg via ORAL
  Filled 2023-12-16: qty 1
  Filled 2023-12-16 (×2): qty 2

## 2023-12-16 MED ORDER — SUCROFERRIC OXYHYDROXIDE 500 MG PO CHEW
1000.0000 mg | CHEWABLE_TABLET | Freq: Three times a day (TID) | ORAL | Status: DC
  Administered 2023-12-16 – 2023-12-18 (×3): 1000 mg via ORAL
  Filled 2023-12-16 (×10): qty 2

## 2023-12-16 MED ORDER — CALCITRIOL 0.25 MCG PO CAPS: 1.0000 ug | ORAL_CAPSULE | ORAL | Status: DC

## 2023-12-16 MED ORDER — SILVER SULFADIAZINE 1 % EX CREA
TOPICAL_CREAM | Freq: Every day | CUTANEOUS | Status: DC
  Filled 2023-12-16: qty 85

## 2023-12-16 MED ORDER — IOHEXOL 350 MG/ML SOLN
100.0000 mL | Freq: Once | INTRAVENOUS | Status: AC | PRN
  Administered 2023-12-16: 100 mL via INTRAVENOUS

## 2023-12-16 MED ORDER — INSULIN ASPART 100 UNIT/ML IJ SOLN: 0.0000 [IU] | Freq: Every day | INTRAMUSCULAR | Status: DC

## 2023-12-16 MED ORDER — BUSPIRONE HCL 15 MG PO TABS
7.5000 mg | ORAL_TABLET | Freq: Two times a day (BID) | ORAL | Status: DC
  Administered 2023-12-16 – 2023-12-18 (×5): 7.5 mg via ORAL
  Filled 2023-12-16: qty 1
  Filled 2023-12-16 (×2): qty 2
  Filled 2023-12-16: qty 1
  Filled 2023-12-16: qty 2

## 2023-12-16 MED ORDER — CHLORHEXIDINE GLUCONATE CLOTH 2 % EX PADS
6.0000 | MEDICATED_PAD | Freq: Every day | CUTANEOUS | Status: DC
  Administered 2023-12-17 – 2023-12-18 (×2): 6 via TOPICAL

## 2023-12-16 NOTE — ED Notes (Addendum)
 Patient cleaned up from small bloody bowel movement.

## 2023-12-16 NOTE — Plan of Care (Signed)

## 2023-12-16 NOTE — Progress Notes (Signed)
 PROGRESS NOTE    Kathryn Beck  FMW:[id number] DOB: 1956/01/21 DOA: 12/15/2023 PCP: Antonetta Rollene BRAVO, MD   Brief Narrative:    Kathryn Beck is a 68 y.o. female with hx of Atrial fib on apixaban , PAD  (s/p R BKA in 05/2022, prior lithotripsy and stenting of the left superficial femoral and popliteal artery in 06/2022 and ultimately L BKA in 09/2022), HTN, HLD, Type 2 DM, bilateral adrenal adenoma (followed by Endocrinology), chronic HFpEF, aortic stenosis, OSA, ESRD (MWF), history of TIA's, history of recurrent chest pain  (cath in 12/2018 showing normal coronary arteries) recent hospitalization for left hand I&D, MSSA infection on antibiotics IV Ancef  with HD, who was recently hospitalized 9/10 - 9/16 for symptomatic anemia and possible GI bleeding and underwent colonoscopy demonstrating diverticulosis and 2 polyps removed, with likely diverticular bleed as source, restarted on [facility] 9/15. Returns with rectal bleeding.  Patient was admitted for severe symptomatic acute blood loss anemia and 3 units of PRBCs have been ordered for transfusion with plans for GI to further evaluate.  Nephrology also consulted for hemodialysis.  Assessment & Plan:   Principal Problem:   Lower GI bleed Active Problems:   Severe anemia  Assessment and Plan:   Lower GI bleeding  Severe symptomatic anemia, with component of acute blood loss  P/w continued dark red rectal bleeding, possible melena as well. Gross hematochezia noted in the ED. Hemodynamically stable. Hemoglobin 5.3 (down from 7.9 on 9/16). Recent hx EGD 9/12 with normal esophagus and duodenum, medium amount of food residue in stomach although no active or stigmata of bleeding noted. colonoscopy on 9/14 with nonbleeding internal hemorrhoids, diverticulosis, 2 polyps which were removed, suspected diverticular bleed last admission.  - Completed 3 unit PRBC transfusion with repeat hemoglobin pending -Hold apixaban  and likely favor discontinuation on  discharge -Planning for endoscopy today   Paroxysmal Atrial fib on apixaban  Hx new onset A-fib in 8/'25, required DCCV in the ED, echo with grade 1 diastolic dysfunction, severe dilation of left atria, moderate AS. CHA2DS2-VASc of 7; was started on anticoagulation that admission, and continued on aspirin  for hx PAD and stents. Plavix  was discontinued.  -- See above, recurrent bleeding for now [facility] and antiPLT on hold.  -- Continue home Metoprolol  50 mg QIW on nonHD days  -- favor discontinuation of anticoagulation at discharge + continuation of aspirin  with f/u as outpatient to consider restarting. May consider for watchman if she is a candidate for this procedure.    ESRD on MWF HD  -- Nephrology plans for hemodialysis in a.m. - Continue home torsemide  on non-HD days, home [name] , calcitriol , sensipar     Recent hx of dry gangrene L hand  Possible SSTI v underlying osteomyelitis  -- Follows with ID, orthopedics outpatient; may require amputation per OP review  -- Continue Cefazolin  2 g IV three times per week after HD.  -- Will continue wound care, photograph L hand wound  - EOT 01/10/2024   Chronic medical problems:  PAD s/p R BKA in 05/2022, prior lithotripsy and stenting of the left superficial femoral and popliteal artery in 06/2022 and ultimately L BKA in 09/2022: Hold antiPLT in setting of bleed, resume aspirin  as soon as able.  HTN: Hold amlodipine  in setting of bleed.  HLD: Continue home zetia   Type 2 DM: Continue home glargine 30 units daily, add SSI for very sensitive, at bedtime correction. bilateral adrenal adenoma: followed by Endocrinology, outpatient follow-up chronic HFpEF: Without acute exacerbation, continue home torsemide  on on HD  days per above aortic stenosis: History of moderate AS, echo surveillance outpatient OSA: not currently using CPAP   History of TIA's: See antiplatelet and anticoagulant management per above history of recurrent chest pain: cath in 12/2018  showing normal coronary arteries Mood d/o: Continue home sertraline     DVT prophylaxis:SCDs Code Status: Full Family Communication: Daughter at bedside 9/25 Disposition Plan:  Status is: Inpatient Remains inpatient appropriate because: Need for IV medications and inpatient evaluation.  Consultants:  GI Nephrology  Procedures:  None  Antimicrobials:  Anti-infectives (From admission, onward)    Start     Dose/Rate Route Frequency Ordered Stop   12/16/23 0115  ceFAZolin  (ANCEF ) IVPB 2g/100 mL premix       Note to Pharmacy: Give after HD on HD days   2 g 200 mL/hr over 30 Minutes Intravenous Once per day on Monday Wednesday Friday 12/16/23 0110         Subjective: Patient seen and evaluated today with no new acute complaints or concerns. No acute concerns or events noted overnight.  She has completed 3 unit PRBC transfusion and is eager to know what the plan is.  Objective: Vitals:   12/16/23 0622 12/16/23 0630 12/16/23 0639 12/16/23 0654  BP: (!) 160/62 (!) 164/64 (!) 168/64 (!) 164/60  Pulse: 69 68 69 67  Resp: 16 10 12 10   Temp: 97.9 F (36.6 C)  97.9 F (36.6 C) 98 F (36.7 C)  TempSrc: Oral  Oral Oral  SpO2: 99% 100% 100% 93%  Weight:      Height:        Intake/Output Summary (Last 24 hours) at 12/16/2023 0721 Last data filed at 12/16/2023 0321 Gross per 24 hour  Intake 315 ml  Output --  Net 315 ml   Filed Weights   12/15/23 2038  Weight: 72.7 kg    Examination:  General exam: Appears calm and comfortable  Respiratory system: Clear to auscultation. Respiratory effort normal. Cardiovascular system: S1 & S2 heard, RRR.  Gastrointestinal system: Abdomen is soft Central nervous system: Alert and awake Extremities: No edema Skin: No significant lesions noted Psychiatry: Flat affect.    Data Reviewed: I have personally reviewed following labs and imaging studies  CBC: Recent Labs  Lab 12/15/23 2105  WBC 9.0  HGB 5.3*  HCT 18.0*  MCV 85.7   PLT 259   Basic Metabolic Panel: Recent Labs  Lab 12/15/23 2105  NA 146*  K 4.1  CL 105  CO2 27  GLUCOSE 260*  BUN 39*  CREATININE 3.97*  CALCIUM  8.2*   GFR: Estimated Creatinine Clearance: 13.4 mL/min (A) (by C-G formula based on SCr of 3.97 mg/dL (H)). Liver Function Tests: Recent Labs  Lab 12/15/23 2105  AST 13*  ALT <5  ALKPHOS 54  BILITOT 0.5  PROT 5.0*  ALBUMIN  2.4*   No results for input(s): LIPASE, AMYLASE in the last 168 hours. No results for input(s): AMMONIA in the last 168 hours. Coagulation Profile: Recent Labs  Lab 12/15/23 2105  INR 1.2   Cardiac Enzymes: No results for input(s): CKTOTAL, CKMB, CKMBINDEX, TROPONINI in the last 168 hours. BNP (last 3 results) No results for input(s): PROBNP in the last 8760 hours. HbA1C: No results for input(s): HGBA1C in the last 72 hours. CBG: Recent Labs  Lab 12/16/23 0206  GLUCAP 164*   Lipid Profile: No results for input(s): CHOL, HDL, LDLCALC, TRIG, CHOLHDL, LDLDIRECT in the last 72 hours. Thyroid  Function Tests: No results for input(s): TSH, T4TOTAL, FREET4, T3FREE,  THYROIDAB in the last 72 hours. Anemia Panel: No results for input(s): VITAMINB12, FOLATE, FERRITIN, TIBC, IRON, RETICCTPCT in the last 72 hours. Sepsis Labs: Recent Labs  Lab 12/15/23 2218  LATICACIDVEN 1.9    No results found for this or any previous visit (from the past 240 hours).       Radiology Studies: No results found.      Scheduled Meds:  sodium chloride    Intravenous Once   busPIRone   7.5 mg Oral BID   [START ON 12/17/2023] calcitRIOL   1 mcg Oral Q M,W,F-HD   [START ON 12/17/2023] cinacalcet   90 mg Oral Q M,W,F   ezetimibe   10 mg Oral Daily   insulin  aspart  0-5 Units Subcutaneous QHS   insulin  aspart  0-6 Units Subcutaneous TID WC   insulin  glargine  30 Units Subcutaneous Daily   metoprolol  succinate  50 mg Oral Q T,Th,S,Su   pantoprazole  (PROTONIX ) IV   40 mg Intravenous Q12H   sertraline   75 mg Oral Daily   sodium chloride  flush  3 mL Intravenous Q12H   sucroferric oxyhydroxide  500-1,000 mg Oral See admin instructions   torsemide   100 mg Oral Q T,Th,S,Su   Continuous Infusions:   ceFAZolin  (ANCEF ) IV       LOS: 0 days    Time spent: 55 minutes    Yamil Oelke JONETTA Fairly, DO [facility]  If 7PM-7AM, please contact night-coverage [email or web address] 12/16/2023, 7:21 AM

## 2023-12-16 NOTE — Consult Note (Signed)
 Reason for Consult: To manage dialysis and dialysis related needs Referring Physician: Dr. Maree Dickey LITTIE Duanne is an 68 y.o. female.  HPI: 16F PMH ESKD on HD MWF, hx of Atrial fib on apixaban , PAD  (s/p R BKA in 05/2022, prior lithotripsy and stenting of the left superficial femoral and popliteal artery in 06/2022 and ultimately L BKA in 09/2022), HTN, HLD, Type 2 DM, bilateral adrenal adenoma (followed by Endocrinology), chronic HFpEF, aortic stenosis, OSA, history of TIA's, history of recurrent chest pain (cath in 12/2018 showing normal coronary arteries) recent hospitalization for left hand I&D, MSSA infection on antibiotics IV Ancef  with HD. Presents 9/24 for rectal bleeding.  Recently hosptialized for symptomatic anemia and underwent colonoscopy, removing 2 polyps. Restarted AC.   Hgb 5.3.  Started IV protonix .  Received pRBC x3.  Held [facility].   Seen in ICU.  Daughter at bedside.  Went over recent events.  She went to HD yesterday, syncopized at some point.  Having bloody bowel movements.  Currently conversational.  No signs of gross volume overload.  Normal work of breathing.  On room air.  Spoke to [employer].  Had normal tx yesterday.  Doesn't recall her syncopizing.    Dialyzes at [facility] EDW 70.5kg. HD Bath 2 k, 2.5 Ca, Dialyzer Nipro 17H, Heparin  Hold 2/2 GIB. Access L AVF. BFR 400 DFR 500. IV Cefazolin  2g 10/20  Past Medical History:  Diagnosis Date   Acid reflux    Amputated left leg (HCC)    bilateral BKA   Anemia    Arthritis    Axillary masses    Soft tissue - status post excision   Back pain    CHF (congestive heart failure) (HCC)    COVID-19 virus infection 04/06/2019   Depression    End-stage renal disease (HCC)    M/W/F dialysis   Essential hypertension    Headache    years ago   History of blood transfusion    History of cardiac catheterization    Normal coronary arteries October 2020   History of claustrophobia    History of pneumonia 2019    Hypoxia 04/03/2019   Memory loss    Mixed hyperlipidemia    Obesity    Pancreatitis    Peritoneal dialysis catheter in place    Pneumonia due to COVID-19 virus 04/02/2019   Sleep apnea    Noncompliant with CPAP   Stroke (HCC)    mini stroke   Type 2 diabetes mellitus (HCC)     Past Surgical History:  Procedure Laterality Date   ABDOMINAL AORTOGRAM W/LOWER EXTREMITY N/A 04/30/2022   Procedure: ABDOMINAL AORTOGRAM W/LOWER EXTREMITY;  Surgeon: Magda Debby SAILOR, MD;  [facility]: [facility];  Service: Cardiovascular;  Laterality: N/A;   ABDOMINAL AORTOGRAM W/LOWER EXTREMITY N/A 07/21/2022   Procedure: ABDOMINAL AORTOGRAM W/LOWER EXTREMITY;  Surgeon: Serene Gaile ORN, MD;  Location: [facility];  Service: Cardiovascular;  Laterality: N/A;   ABDOMINAL HYSTERECTOMY     ACHILLES TENDON LENGTHENING  08/15/2022   Procedure: ACHILLES TENDON LENGTHENING;  Surgeon: Silva Juliene SAUNDERS, DPM;  Location: [facility] OR;  Service: Podiatry;;   AMPUTATION Right 05/29/2022   Procedure: RIGHT BELOW THE KNEE AMPUTATION;  Surgeon: Harden Jerona GAILS, MD;  Location: [facility] OR;  Service: Orthopedics;  Laterality: Right;   AMPUTATION Left 09/04/2022   Procedure: AMPUTATION FOOT, serial irrigation;  Surgeon: Joya Stabs, DPM;  Location: [facility] OR;  Service: Podiatry;  Laterality: Left;  Surgical team to do block  AMPUTATION Left 10/07/2022   Procedure: LEFT BELOW KNEE AMPUTATION;  Surgeon: Harden Jerona GAILS, MD;  Location: [facility] OR;  Service: Orthopedics;  Laterality: Left;   AMPUTATION FINGER Left 09/29/2023   Procedure: AMPUTATION, FINGER;  Surgeon: Harden Jerona GAILS, MD;  Location: [facility] OR;  Service: Orthopedics;  Laterality: Left;  LEFT HAND LONG FINGER RAY AMPUTATION   AV FISTULA PLACEMENT Left 09/02/2017   Procedure: creation of left arm ARTERIOVENOUS (AV) FISTULA;  Surgeon: Serene Gaile ORN, MD;  Location: [facility] OR;  Service: Vascular;  Laterality: Left;   COLONOSCOPY  2008   Dr. Harvey: normal    COLONOSCOPY N/A 12/18/2016   Dr.  Harvey: multiple tubular adenomas, internal hemorrhoids. Surveillance in 3 years    COLONOSCOPY N/A 12/05/2023   Procedure: COLONOSCOPY;  Surgeon: Cindie Carlin POUR, DO;  [facility]: [facility];  Service: Endoscopy;  Laterality: N/A;   ESOPHAGEAL DILATION N/A 10/13/2015   Procedure: ESOPHAGEAL DILATION;  Surgeon: Claudis RAYMOND Rivet, MD;  [facility]: [facility];  Service: Endoscopy;  Laterality: N/A;   ESOPHAGEAL DILATION N/A 12/03/2023   Procedure: DILATION, ESOPHAGUS;  Surgeon: Cindie Carlin POUR, DO;  [facility];  Service: Endoscopy;  Laterality: N/A;   ESOPHAGOGASTRODUODENOSCOPY N/A 10/13/2015   Dr. Rivet: chronic gastritis on path, no H.pylori. Empiric dilation    ESOPHAGOGASTRODUODENOSCOPY N/A 12/18/2016   Dr. Harvey: mild gastritis. BRAVO study revealed uncontrolled GERD. Dysphagia secondary to uncontrolled reflux   ESOPHAGOGASTRODUODENOSCOPY N/A 12/03/2023   Procedure: EGD (ESOPHAGOGASTRODUODENOSCOPY);  Surgeon: Cindie Carlin POUR, DO;  [facility];  Service: Endoscopy;  Laterality: N/A;   FOOT SURGERY Bilateral    nerve     INCISION AND DRAINAGE OF WOUND Left 10/30/2023   Procedure: IRRIGATION AND DEBRIDEMENT WOUND;  Surgeon: Arlinda Buster, MD;  [facility]: [facility];  Service: Orthopedics;  Laterality: Left;  LEFT HAND WOUND   LEFT HEART CATH AND CORONARY ANGIOGRAPHY N/A 12/29/2018   Procedure: LEFT HEART CATH AND CORONARY ANGIOGRAPHY;  Surgeon: Dann Candyce RAMAN, MD;  Location: [facility] INVASIVE CV LAB;  Service: Cardiovascular;  Laterality: N/A;   LOWER EXTREMITY ANGIOGRAPHY Right 05/04/2022   Procedure: Lower Extremity Angiography;  Surgeon: Lanis Fonda BRAVO, MD;  Location: [facility] INVASIVE CV LAB;  Service: Cardiovascular;  Laterality: Right;   LUNG BIOPSY     MASS EXCISION Right 01/09/2013   Procedure: EXCISION OF NEOPLASM OF RIGHT  AXILLA  AND EXCISION OF NEOPLASM OF LEFT AXILLA;  Surgeon: Oneil DELENA Budge, MD;  Location: [facility] ORS;  Service: General;  Laterality: Right;   procedure end @ 08:23   MYRINGOTOMY WITH TUBE PLACEMENT Bilateral 04/28/2017   Procedure: BILATERAL MYRINGOTOMY WITH TUBE PLACEMENT;  Surgeon: Karis Clunes, MD;  Location: [facility] OR;  Service: ENT;  Laterality: Bilateral;   PERIPHERAL VASCULAR BALLOON ANGIOPLASTY Right 05/04/2022   Procedure: PERIPHERAL VASCULAR BALLOON ANGIOPLASTY;  Surgeon: Lanis Fonda BRAVO, MD;  Location: [facility] INVASIVE CV LAB;  Service: Cardiovascular;  Laterality: Right;  PT   PERIPHERAL VASCULAR INTERVENTION Right 05/04/2022   Procedure: PERIPHERAL VASCULAR INTERVENTION;  Surgeon: Lanis Fonda BRAVO, MD;  Location: [facility] INVASIVE CV LAB;  Service: Cardiovascular;  Laterality: Right;  SFA   PERIPHERAL VASCULAR INTERVENTION Left 07/21/2022   Procedure: PERIPHERAL VASCULAR INTERVENTION;  Surgeon: Serene Gaile ORN, MD;  [facility];  Service: Cardiovascular;  Laterality: Left;   REVISION OF ARTERIOVENOUS GORETEX GRAFT Left 05/04/2018   Procedure: TRANSPOSITION OF CEPHALIC VEIN ARTERIOVENOUS FISTULA LEFT ARM;  Surgeon: Oris Krystal FALCON, MD;  Location: [facility] OR;  Service:  Vascular;  Laterality: Left;   SAVORY DILATION N/A 12/18/2016   Procedure: SAVORY DILATION;  Surgeon: Harvey Margo CROME, MD;  [facility];  Service: Endoscopy;  Laterality: N/A;   TRANSMETATARSAL AMPUTATION Left 08/15/2022   Procedure: TRANSMETATARSAL AMPUTATION;  Surgeon: Silva Juliene SAUNDERS, DPM;  Location: [facility] OR;  Service: Podiatry;  Laterality: Left;    Family History  Problem Relation Age of Onset   Hypertension Father    Hypercholesterolemia Father    Arthritis Father    Hypertension Sister    Hypercholesterolemia Sister    Breast cancer Sister    Hypertension Sister    Colon cancer Neg Hx    Colon polyps Neg Hx     Social History:  reports that she has never smoked. She has never been exposed to tobacco smoke. She has never used smokeless tobacco. She reports that she does not drink alcohol and does not use drugs.  Allergies:  Allergies  Allergen  Reactions   Ace Inhibitors Anaphylaxis and Swelling   Penicillins Itching and Swelling    Has tolerated cefazolin  on multiple occasions    Statins Other (See Comments)    Elevated LFT's   Albuterol  Swelling    Medications: I have reviewed the patient's current medications.  Calcitriol  on HD day Sensilet 90mg  on HD Micera 2mcg q2w  Results for orders placed or performed during the hospital encounter of 12/15/23 (from the past 48 hours)  Comprehensive metabolic panel     Status: Abnormal   Collection Time: 12/15/23  9:05 PM  Result Value Ref Range   Sodium 146 (H) 135 - 145 mmol/L   Potassium 4.1 3.5 - 5.1 mmol/L   Chloride 105 98 - 111 mmol/L   CO2 27 22 - 32 mmol/L   Glucose, Bld 260 (H) 70 - 99 mg/dL    Comment: Glucose reference range applies only to samples taken after fasting for at least 8 hours.   BUN 39 (H) 8 - 23 mg/dL   Creatinine, Ser 6.02 (H) 0.44 - 1.00 mg/dL   Calcium  8.2 (L) 8.9 - 10.3 mg/dL   Total Protein 5.0 (L) 6.5 - 8.1 g/dL   Albumin  2.4 (L) 3.5 - 5.0 g/dL   AST 13 (L) 15 - 41 U/L   ALT <5 0 - 44 U/L   Alkaline Phosphatase 54 38 - 126 U/L   Total Bilirubin 0.5 0.0 - 1.2 mg/dL   GFR, Estimated 12 (L) >60 mL/min    Comment: (NOTE) Calculated using the CKD-EPI Creatinine Equation (2021)    Anion gap 14 5 - 15    Comment: Performed at [facility] [address] [place], [place] [address]  CBC     Status: Abnormal   Collection Time: 12/15/23  9:05 PM  Result Value Ref Range   WBC 9.0 4.0 - 10.5 K/uL   RBC 2.10 (L) 3.87 - 5.11 MIL/uL   Hemoglobin 5.3 (LL) 12.0 - 15.0 g/dL    Comment: REPEATED TO VERIFY This critical result has been called to B HICKS,RN by Earnie Sor on 12/15/2023 22:26:11, and has been read back. REPEATED TO VERIFY.    HCT 18.0 (L) 36.0 - 46.0 %   MCV 85.7 80.0 - 100.0 fL   MCH 25.2 (L) 26.0 - 34.0 pg   MCHC 29.4 (L) 30.0 - 36.0 g/dL   RDW 81.4 (H) 88.4 - 84.4 %   Platelets 259 150 - 400 K/uL   nRBC 0.4 (H) 0.0 - 0.2 %  Comment: Performed at [facility], [address], [place], [place] [address]  Type and screen [facility]     Status: None (Preliminary result)   Collection Time: 12/15/23  9:05 PM  Result Value Ref Range   ABO/RH(D) B POS    Antibody Screen NEG    Sample Expiration 12/18/2023,2359    Unit Number [id number]    Blood Component Type RED CELLS,LR    Unit division 00    Status of Unit ISSUED    Transfusion Status OK TO TRANSFUSE    Crossmatch Result      Compatible Performed at [facility], [address] [place], [place] [address]    Unit Number [id number]    Blood Component Type RED CELLS,LR    Unit division 00    Status of Unit ISSUED    Transfusion Status OK TO TRANSFUSE    Crossmatch Result Compatible    Unit Number [id number]    Blood Component Type RED CELLS,LR    Unit division 00    Status of Unit ISSUED    Transfusion Status OK TO TRANSFUSE    Crossmatch Result Compatible   Protime-INR     Status: Abnormal   Collection Time: 12/15/23  9:05 PM  Result Value Ref Range   Prothrombin Time 15.4 (H) 11.4 - 15.2 seconds   INR 1.2 0.8 - 1.2    Comment: (NOTE) INR goal varies based on device and disease states. Performed at [facility], [address], [place], [place] [address]   Prepare RBC (crossmatch)     Status: None   Collection Time: 12/15/23  9:05 PM  Result Value Ref Range   Order Confirmation      ORDER PROCESSED BY BLOOD BANK Performed at [facility], [address], [place], [place] [address]   Lactic acid, plasma     Status: None   Collection Time: 12/15/23 10:18 PM  Result Value Ref Range   Lactic Acid, Venous 1.9 0.5 - 1.9 mmol/L    Comment: Performed at [facility] [place], [address] [place], [place] [address]  CBG monitoring, ED     Status: Abnormal   Collection Time: 12/16/23  2:06 AM  Result Value Ref Range   Glucose-Capillary 164 (H) 70 - 99 mg/dL    Comment: Glucose reference range applies only to samples taken after fasting for  at least 8 hours.  CBG monitoring, ED     Status: Abnormal   Collection Time: 12/16/23  7:23 AM  Result Value Ref Range   Glucose-Capillary 146 (H) 70 - 99 mg/dL    Comment: Glucose reference range applies only to samples taken after fasting for at least 8 hours.   *Note: Due to a large number of results and/or encounters for the requested time period, some results have not been displayed. A complete set of results can be found in Results Review.    No results found.  ROS: No N/V, SOB, CP, HA, abd pain Blood pressure (!) 169/60, pulse 69, temperature 98 F (36.7 C), temperature source Oral, resp. rate 10, height 5' 4 (1.626 m), weight 72.7 kg, SpO2 98%. General appearance: alert, cooperative, and appears stated age Resp: BLBS Cardio: RRR Extremities: BL BKA, no edema L AVF  Assessment/Plan: 1 Rectal Bleed: Recent colonoscopy.  Rec'd pRBC.  GI consulted.  On PPI.   2 ESRD: MWF.  At baseline today.  Not volume overloaded.  Plan routine HD tomorrow. 3 Hypertension: Plan routine HD to help with volume.  BP  per primary 4. Anemia of ESRD: s/p pRBC as above.  5. Metabolic Bone Disease: On home [name] , calcitriol , sensipar .  Continue.  6. SSTI v osteomyelitis of hand: On IV Cefazolin  2g 3x/week after HD.  EOT 01/10/2024. 7. Hypernatremia: Pend fluids with pRBC.  Recheck.    Evalene HERO Jayziah Bankhead 12/16/2023, 9:02 AM

## 2023-12-16 NOTE — Telephone Encounter (Signed)
 Patient admitted to hospital overnight. We will see in consultation today

## 2023-12-16 NOTE — Consult Note (Addendum)
 Gastroenterology Consult   Referring Provider: No ref. provider found Primary Care Physician:  Antonetta Rollene BRAVO, MD Primary Gastroenterologist:  formerly Dr. Harvey  Patient ID: Kathryn Beck; [id number]; 09-26-1955   Admit date: 12/15/2023  LOS: 0 days   Date of Consultation: 12/16/2023  Reason for Consultation:  gi bleeding    History of Present Illness   Kathryn Beck is a 68 y.o. female with past medical history significant for GERD, pancreatitis, moderate aortic stenosis (ECHO 05/2023) recent new onset A-fib on apixaban  with cardioversion 10/27/2023, PAD (s/p R BKA in 05/2022, prior lithotripsy and stenting of the left superficial femoral and popliteal artery in 06/2022 and ultimately L BKA in 09/2022), HTN, HLD, Type 2 DM, bilateral adrenal adenoma (followed by Endocrinology), chronic HFpEF, OSA, ESRD (MWF), history of TIA's, history of recurrent chest pain  (cath in 12/2018 showing normal coronary arteries) recent hospitalization for left hand I&D, MSSA infection on antibiotics IV Ancef , last admission 9/11 with acute on chronic anemia with gi bleeding. Presenting now with persistent gi bleeding.  ED course: Sodium 146, glucose 260, BUN 39, creatinine 3.97, albumin  2.4, 5.3 down from 7.9 (nine days ago), MCV 85.7, platelets 259  She received 2 units of packed red blood cells earlier this morning, third unit currently infusing.  GI consult: During last admission patient reported hard infrequent stools with occasional bright red blood per rectum.  Chronically has black stools in the setting of [name] .  Stools were Hemoccult positive.  Also complained of solid food dysphagia. She completed EGD/colonoscopy as outlined below.   After discharge, patient and daughter Kathryn Beck) report that patient never really stopped bleeding after last hospitalization. Over the past few days however, Kathryn Beck reports that the bleeding seemed to be increasing. Stools are dark to black chronically  due to her medication. But she is also passing dark red blood. They see the blood in her pull up. She has had several episodes each day at home. Since arrival, she has had several small bloody stools. Last blood per rectum noted around 9:45 by nurse, has small clots.   She denies abdominal pain. No n/v. No heartburn. Her bowels have been moving more regularly than in the past.   Her last dose of Eliquis  was Tuesday night.    Colonoscopy December 05, 2023: - Nonbleeding internal hemorrhoids -Diverticulosis in the sigmoid colon, hepatic flexure, ascending colon -Single 10 mm polyp in the ascending colon removed, tubular adenoma -Single 5 mm polyp in the transverse colon removed, tubular adenoma -Likely left from right sided diverticular disease versus benign hemorrhoidal. -If evidence of further bleeding, would recommend CT angio and consider IR consultation - Next colonoscopy in 3 years  EGD December 03, 2023 - Normal esophagus - Medium amount of food residue in stomach - Normal duodenal bulb, first portion of duodenum and second portion of duodenum  EGD 06/2017 at [facility]: op note not viewable Path-mild chronic gastritis and no intestinal metaplasia, reactive folveolar hyperplasia, no h.pylori. negative proximal and distal esophageal biopsies.    EGD 11/2016: Dr. Harvey -no source for dysphagia -mild gastritis -bravo placed, omeprazole  does not control acid reflux   Colonoscopy 11/2016: -3 polyps removed from the rectum, tubular adenomas -redundant colon -internal hemorrhoids -next colonoscopy in 3 years     Prior to Admission medications   Medication Sig Start Date End Date Taking? Authorizing Provider  acetaminophen  (TYLENOL ) 500 MG tablet Take 1,000 mg by mouth every 6 (six) hours as needed for mild  pain (pain score 1-3).    [provider]  amLODipine  (NORVASC ) 10 MG tablet Take 0.5 tablets (5 mg total) by mouth daily. 10/28/23   Pearlean Manus, MD   apixaban  (ELIQUIS ) 5 MG TABS tablet Take 1 tablet (5 mg total) by mouth 2 (two) times daily. For Afib 10/29/23   Pearlean Manus, MD  aspirin  81 MG chewable tablet Chew 2 tablets (162 mg total) by mouth daily. 09/16/23   Bevely Doffing, FNP  B Complex-C-Folic Acid  (RENA-VITE RX) 1 MG TABS Take 1 tablet by mouth daily. 04/15/22   [provider]  busPIRone  (BUSPAR ) 7.5 MG tablet Take 1 tablet (7.5 mg total) by mouth 2 (two) times daily. 11/24/23   Antonetta Rollene BRAVO, MD  calcitRIOL  (ROCALTROL ) 0.5 MCG capsule Take 2 capsules (1 mcg total) by mouth every Monday, Wednesday, and Friday with hemodialysis. 11/05/23   Caleen Burgess BROCKS, MD  CEFAZOLIN  SODIUM IV Inject into the vein every Monday, Wednesday, and Friday with hemodialysis. 11/30/23 01/11/24  [provider]  cinacalcet  (SENSIPAR ) 30 MG tablet Take 3 tablets (90 mg total) by mouth every Monday, Wednesday, and Friday. 12/08/23   Vicci Afton CROME, MD  Darbepoetin Alfa  (ARANESP ) 40 MCG/0.4ML SOSY injection Inject 0.4 mLs (40 mcg total) into the skin every Friday at 6 PM. Patient taking differently: Inject 40 mcg into the vein every Friday at 6 PM. 11/05/23   Amin, Ankit C, MD  ezetimibe  (ZETIA ) 10 MG tablet Take 1 tablet (10 mg total) by mouth daily. 04/27/23   Antonetta Rollene BRAVO, MD  insulin  aspart (NOVOLOG ) 100 UNIT/ML injection Inject 0-6 Units into the skin 3 (three) times daily with meals. CBG 70 - 120: 0 units  CBG 121 - 150: 0 units  CBG 151 - 200: 1 unit  CBG 201-250: 2 units  CBG 251-300: 3 units  CBG 301-350: 4 units  CBG 351-400: 5 units  CBG > 400: Give 10 units and call MD 11/12/23   Shahmehdi, Seyed A, MD  insulin  glargine, 2 Unit Dial , (TOUJEO  MAX SOLOSTAR) 300 UNIT/ML Solostar Pen Inject 30 Units into the skin daily. May need to slowly increase the dose depending upon your blood sugar, follow-up with PCP 01/01/23   Trixie File, MD  metoprolol  succinate (TOPROL -XL) 50 MG 24 hr tablet TAKE (1) TABLET BY MOUTH DAILY  WITH FOOD *TAKE AFTER DIALYSIS* Patient taking differently: Take 50 mg by mouth every Tuesday, Thursday, Saturday, and Sunday. Non-dialysis days 01/05/23   Antonetta Rollene BRAVO, MD  ondansetron  (ZOFRAN ) 4 MG tablet Take 4 mg by mouth every 8 (eight) hours as needed for nausea or vomiting. 11/26/23   [provider]  pantoprazole  (PROTONIX ) 40 MG tablet Take 1 tablet (40 mg total) by mouth daily. 12/08/23   Johnson, Clanford L, MD  sertraline  (ZOLOFT ) 50 MG tablet Take one and a half tablets once daily Patient taking differently: Take 75 mg by mouth daily. Take one and a half tablets once daily 11/24/23   Antonetta Rollene BRAVO, MD  torsemide  (DEMADEX ) 100 MG tablet Take 1 tablet (100 mg total) by mouth every Tuesday, Thursday, Saturday, and Sunday. Patient taking differently: Take 100 mg by mouth every Tuesday, Thursday, Saturday, and Sunday. Non-dialysis days 10/28/23   Pearlean Manus, MD  [name]  500 MG chewable tablet Chew 1-2 tablets (500-1,000 mg total) by mouth See admin instructions. Take 1000mg  (2 tablets) by mouth with meals and 500mg  (1 tablet) with snacks 08/23/23   Antonetta Rollene BRAVO, MD  FLUoxetine (PROZAC) 10 MG capsule  Take 10 mg by mouth daily.    05/28/11  [provider]  glipiZIDE  (GLUCOTROL ) 10 MG tablet Take 10 mg by mouth 2 (two) times daily before a meal.    05/28/11  [provider]    Current Facility-Administered Medications  Medication Dose Route Frequency Provider Last Rate Last Admin   0.9 %  sodium chloride  infusion (Manually program via Guardrails IV Fluids)   Intravenous Once Franklyn Sid SAILOR, MD       busPIRone  (BUSPAR ) tablet 7.5 mg  7.5 mg Oral BID Segars, Jonathan, MD   7.5 mg at 12/16/23 0935   [START ON 12/17/2023] calcitRIOL  (ROCALTROL ) capsule 1 mcg  1 mcg Oral Q M,W,F-HD Segars, Jonathan, MD       NOREEN ON 12/17/2023] ceFAZolin  (ANCEF ) IVPB 2g/100 mL premix  2 g Intravenous Q M,W,F-HD Darden, Timothy M, DO       Chlorhexidine  Gluconate Cloth 2 %  PADS 6 each  6 each Topical Q0600 Shah, Pratik D, DO   6 each at 12/16/23 9068   Chlorhexidine  Gluconate Cloth 2 % PADS 6 each  6 each Topical Q0600 Darden, Timothy M, DO       [START ON 12/17/2023] cinacalcet  (SENSIPAR ) tablet 90 mg  90 mg Oral Q M,W,F Segars, Dorn, MD       ezetimibe  (ZETIA ) tablet 10 mg  10 mg Oral Daily Segars, Jonathan, MD   10 mg at 12/16/23 9062   insulin  aspart (novoLOG ) injection 0-5 Units  0-5 Units Subcutaneous QHS Segars, Jonathan, MD       insulin  aspart (novoLOG ) injection 0-6 Units  0-6 Units Subcutaneous TID WC Segars, Dorn, MD       metoprolol  succinate (TOPROL -XL) 24 hr tablet 50 mg  50 mg Oral Q T,Th,S,Su Segars, Jonathan, MD   50 mg at 12/16/23 0946   ondansetron  (ZOFRAN ) injection 4 mg  4 mg Intravenous Q8H PRN Franklyn Sid SAILOR, MD   4 mg at 12/15/23 2228   pantoprazole  (PROTONIX ) injection 40 mg  40 mg Intravenous Q12H Naasz, Hayley N, MD   40 mg at 12/16/23 0935   sertraline  (ZOLOFT ) tablet 75 mg  75 mg Oral Daily Segars, Jonathan, MD   75 mg at 12/16/23 9062   silver  sulfADIAZINE  (SILVADENE ) 1 % cream   Topical Daily Maree Bracken D, DO   Given at 12/16/23 1208   sodium chloride  flush (NS) 0.9 % injection 3 mL  3 mL Intravenous Q12H Segars, Jonathan, MD   3 mL at 12/16/23 9065   sucroferric oxyhydroxide ([name] ) chewable tablet 1,000 mg  1,000 mg Oral TID with meals Segars, Jonathan, MD       sucroferric oxyhydroxide ([name] ) chewable tablet 500 mg  500 mg Oral With snacks Maree, Pratik D, DO       torsemide  (DEMADEX ) tablet 100 mg  100 mg Oral Q T,Th,S,Su Segars, Jonathan, MD   100 mg at 12/16/23 1020   Facility-Administered Medications Ordered in Other Encounters  Medication Dose Route Frequency Provider Last Rate Last Admin   0.9 %  sodium chloride  infusion   Intravenous Continuous Lockamy, Randi L, NP-C 20 mL/hr at 12/05/23 1000 Continued from Pre-op  at 12/05/23 1000    Allergies as of 12/15/2023 - Review Complete 12/15/2023  Allergen  Reaction Noted   Ace inhibitors Anaphylaxis and Swelling 06/07/2007   Penicillins Itching and Swelling 06/07/2007   Statins Other (See Comments) 12/24/2012   Albuterol  Swelling 08/31/2017    Past Medical History:  Diagnosis Date   Acid  reflux    Amputated left leg (HCC)    bilateral BKA   Anemia    Arthritis    Axillary masses    Soft tissue - status post excision   Back pain    CHF (congestive heart failure) (HCC)    COVID-19 virus infection 04/06/2019   Depression    End-stage renal disease (HCC)    M/W/F dialysis   Essential hypertension    Headache    years ago   History of blood transfusion    History of cardiac catheterization    Normal coronary arteries October 2020   History of claustrophobia    History of pneumonia 2019   Hypoxia 04/03/2019   Memory loss    Mixed hyperlipidemia    Obesity    Pancreatitis    Peritoneal dialysis catheter in place    Pneumonia due to COVID-19 virus 04/02/2019   Sleep apnea    Noncompliant with CPAP   Stroke (HCC)    mini stroke   Type 2 diabetes mellitus (HCC)     Past Surgical History:  Procedure Laterality Date   ABDOMINAL AORTOGRAM W/LOWER EXTREMITY N/A 04/30/2022   Procedure: ABDOMINAL AORTOGRAM W/LOWER EXTREMITY;  Surgeon: Magda Debby SAILOR, MD;  Location: [facility] INV[facility] LAB;  Service: Cardiovascular;  Laterality: N/A;   ABDOMINAL AORTOGRAM W/LOWER EXTREMITY N/A 07/21/2022   Procedure: ABDOMINAL AORTOGRAM W/LOWER EXTREMITY;  Surgeon: Serene Gaile ORN, MD;  Location: [facility] CV [facility];  Service: Cardiovascular;  Laterality: N/A;   ABDOMINAL HYSTERECTOMY     ACHILLES TENDON LENGTHENING  08/15/2022   Procedure: ACHILLES TENDON LENGTHENING;  Surgeon: Silva Juliene SAUNDERS, DPM;  Location: [facility] OR;  Service: Podiatry;;   AMPUTATION Right 05/29/2022   Procedure: RIGHT BELOW THE KNEE AMPUTATION;  Surgeon: Harden Jerona GAILS, MD;  Location: [facility] OR;  Service: Orthopedics;  Laterality: Right;   AMPUTATION Left 09/04/2022   Procedure: AMPUTATION  FOOT, serial irrigation;  Surgeon: Joya Stabs, DPM;  Location: [facility] OR;  Service: Podiatry;  Laterality: Left;  Surgical team to do block   AMPUTATION Left 10/07/2022   Procedure: LEFT BELOW KNEE AMPUTATION;  Surgeon: Harden Jerona GAILS, MD;  Location: [facility] OR;  Service: Orthopedics;  Laterality: Left;   AMPUTATION FINGER Left 09/29/2023   Procedure: AMPUTATION, FINGER;  Surgeon: Harden Jerona GAILS, MD;  Location[facility] OR;  Service: Orthopedics;  Laterality: Left;  LEFT HAND LONG FINGER RAY AMPUTATION   AV FISTULA PLACEMENT Left 09/02/2017   Procedure: creation of left arm ARTERIOVENOUS (AV) FISTULA;  Surgeon: Serene Gaile ORN, MD;  Location: [facility] OR;  Service: Vascular;  Laterality: Left;   COLONOSCOPY  2008   Dr. Harvey: normal    COLONOSCOPY N/A 12/18/2016   Dr. Harvey: multiple tubular adenomas, internal hemorrhoids. Surveillance in 3 years    COLONOSCOPY N/A 12/05/2023   Procedure: COLONOSCOPY;  Surgeon: Cindie Carlin POUR, DO;  Location: [facility];  Service: Endoscopy;  Laterality: N/A;   ESOPHAGEAL DILATION N/A 10/13/2015   Procedure: ESOPHAGEAL DILATION;  Surgeon: Claudis RAYMOND Rivet, MD;  Location: [facility];  Service: Endoscopy;  Laterality: N/A;   ESOPHAGEAL DILATION N/A 12/03/2023   Procedure: DILATION, ESOPHAGUS;  Surgeon: Cindie Carlin POUR, DO;  Location: [facility];  Service: Endoscopy;  Laterality: N/A;   ESOPHAGOGASTRODUODENOSCOPY N/A 10/13/2015   Dr. Rivet: chronic gastritis on path, no H.pylori. Empiric dilation    ESOPHAGOGASTRODUODENOSCOPY N/A 12/18/2016   Dr. Harvey: mild gastritis. BRAVO study revealed uncontrolled GERD. Dysphagia secondary to uncontrolled reflux   ESOPHAGOGASTRODUODENOSCOPY N/A  12/03/2023   Procedure: EGD (ESOPHAGOGASTRODUODENOSCOPY);  Surgeon: Cindie Carlin POUR, DO;  Location: [facility];  Service: Endoscopy;  Laterality: N/A;   FOOT SURGERY Bilateral    nerve     INCISION AND DRAINAGE OF WOUND Left 10/30/2023   Procedure: IRRIGATION AND DEBRIDEMENT WOUND;   Surgeon: Arlinda Buster, MD;  Location: [facility] OR;  Service: Orthopedics;  Laterality: Left;  LEFT HAND WOUND   LEFT HEART CATH AND CORONARY ANGIOGRAPHY N/A 12/29/2018   Procedure: LEFT HEART CATH AND CORONARY ANGIOGRAPHY;  Surgeon: Dann Candyce RAMAN, MD;  Location: [facility] INVASIVE CV LAB;  Service: Cardiovascular;  Laterality: N/A;   LOWER EXTREMITY ANGIOGRAPHY Right 05/04/2022   Procedure: Lower Extremity Angiography;  Surgeon: Lanis Fonda BRAVO, MD;  Location: [facility] INVASIVE CV LAB;  Service: Cardiovascular;  Laterality: Right;   LUNG BIOPSY     MASS EXCISION Right 01/09/2013   Procedure: EXCISION OF NEOPLASM OF RIGHT  AXILLA  AND EXCISION OF NEOPLASM OF LEFT AXILLA;  Surgeon: Oneil DELENA Budge, MD;  Location: [facility] ORS;  Service: General;  Laterality: Right;  procedure end @ 08:23   MYRINGOTOMY WITH TUBE PLACEMENT Bilateral 04/28/2017   Procedure: BILATERAL MYRINGOTOMY WITH TUBE PLACEMENT;  Surgeon: Karis Clunes, MD;  Location: [facility] OR;  Service: ENT;  Laterality: Bilateral;   PERIPHERAL VASCULAR BALLOON ANGIOPLASTY Right 05/04/2022   Procedure: PERIPHERAL VASCULAR BALLOON ANGIOPLASTY;  Surgeon: Lanis Fonda BRAVO, MD;  Location: [facility] INVASIVE CV LAB;  Service: Cardiovascular;  Laterality: Right;  PT   PERIPHERAL VASCULAR INTERVENTION Right 05/04/2022   Procedure: PERIPHERAL VASCULAR INTERVENTION;  Surgeon: Lanis Fonda BRAVO, MD;  Location: [facility] INVASIVE CV LAB;  Service: Cardiovascular;  Laterality: Right;  SFA   PERIPHERAL VASCULAR INTERVENTION Left 07/21/2022   Procedure: PERIPHERAL VASCULAR INTERVENTION;  Surgeon: Serene Gaile ORN, MD;  Location: [facility];  Service: Cardiovascular;  Laterality: Left;   REVISION OF ARTERIOVENOUS GORETEX GRAFT Left 05/04/2018   Procedure: TRANSPOSITION OF CEPHALIC VEIN ARTERIOVENOUS FISTULA LEFT ARM;  Surgeon: Oris Krystal FALCON, MD;  Location: [facility] OR;  Service: Vascular;  Laterality: Left;   SAVORY DILATION N/A 12/18/2016   Procedure: SAVORY DILATION;  Surgeon: Harvey Margo CROME, MD;   [facility]: [facility];  Service: Endoscopy;  Laterality: N/A;   TRANSMETATARSAL AMPUTATION Left 08/15/2022   Procedure: TRANSMETATARSAL AMPUTATION;  Surgeon: Silva Juliene SAUNDERS, DPM;  Location: [facility] OR;  Service: Podiatry;  Laterality: Left;    Family History  Problem Relation Age of Onset   Hypertension Father    Hypercholesterolemia Father    Arthritis Father    Hypertension Sister    Hypercholesterolemia Sister    Breast cancer Sister    Hypertension Sister    Colon cancer Neg Hx    Colon polyps Neg Hx     Social History   Socioeconomic History   Marital status: Married    Spouse name: Venia Riveron   Number of children: 2   Years of education: 12   Highest education level: Some college, no degree  Occupational History   Occupation: retired   Tobacco Use   Smoking status: Never    Passive exposure: Never   Smokeless tobacco: Never   Tobacco comments:    Verified by Daughter, Augusto Beck  Vaping Use   Vaping status: Never Used  Substance and Sexual Activity   Alcohol use: No   Drug use: No   Sexual activity: Not Currently    Partners: Male  Other Topics Concern   Not on file  Social History  Narrative   Lives alone with Beck    Right handed   Caffeine-1/2 daily   Social Drivers of Health   Financial Resource Strain: Low Risk  (11/24/2023)   Overall Financial Resource Strain (CARDIA)    Difficulty of Paying Living Expenses: Not hard at all  Food Insecurity: No Food Insecurity (12/16/2023)   Hunger Vital Sign    Worried About Running Out of Food in the Last Year: Never true    Ran Out of Food in the Last Year: Never true  Transportation Needs: No Transportation Needs (12/16/2023)   PRAPARE - Administrator, Civil Service (Medical): No    Lack of Transportation (Non-Medical): No  Physical Activity: Inactive (11/24/2023)   Exercise Vital Sign    Days of Exercise per Week: 0 days    Minutes of Exercise per Session: Not on file  Stress: Stress  Concern Present (11/24/2023)   [employer] of Occupational Health - Occupational Stress Questionnaire    Feeling of Stress: To some extent  Social Connections: Moderately Integrated (12/16/2023)   Social Connection and Isolation Panel    Frequency of Communication with Friends and Family: More than three times a week    Frequency of Social Gatherings with Friends and Family: More than three times a week    Attends Religious Services: 1 to 4 times per year    Active Member of [employer] or Organizations: No    Attends Banker Meetings: Never    Marital Status: Married  Recent Concern: Social Connections - Moderately Isolated (11/25/2023)   Social Connection and Isolation Panel    Frequency of Communication with Friends and Family: Three times a week    Frequency of Social Gatherings with Friends and Family: Three times a week    Attends Religious Services: Patient declined    Active Member of Clubs or Organizations: No    Attends Banker Meetings: Never    Marital Status: Married  Catering manager Violence: Not At Risk (12/16/2023)   Humiliation, Afraid, Rape, and Kick questionnaire    Fear of Current or Ex-Partner: No    Emotionally Abused: No    Physically Abused: No    Sexually Abused: No     Review of System:   General: Negative for anorexia, weight loss, fever, chills, fatigue, +weakness. Eyes: Negative for vision changes.  ENT: Negative for hoarseness, difficulty swallowing , nasal congestion. CV: Negative for chest pain, angina, palpitations, dyspnea on exertion, peripheral edema.  Respiratory: Negative for dyspnea at rest, dyspnea on exertion, cough, sputum, wheezing.  GI: See history of present illness. GU:  Negative for dysuria, hematuria, urinary incontinence, urinary frequency, nocturnal urination.  MS: Negative for joint pain, low back pain.  Derm: Negative for rash or itching.  Neuro: Negative for weakness, abnormal sensation, seizure, frequent  headaches, memory loss, confusion.  Psych: Negative for anxiety, depression, suicidal ideation, hallucinations.  Endo: Negative for unusual weight change.  Heme: Negative for bruising or bleeding. Allergy: Negative for rash or hives.      Physical Examination:   Vital signs in last 24 hours: Temp:  [97.5 F (36.4 C)-98.8 F (37.1 C)] 97.7 F (36.5 C) (09/25 0943) Pulse Rate:  [62-78] 77 (09/25 1200) Resp:  [9-18] 15 (09/25 1200) BP: (118-181)/(44-136) 177/58 (09/25 1200) SpO2:  [92 %-100 %] 100 % (09/25 1200) Weight:  [70.3 kg-72.7 kg] 70.3 kg (09/25 0943) Last BM Date : 12/16/23  General: Well-nourished, well-developed in no acute distress.  Head: Normocephalic, atraumatic.  Eyes: Conjunctiva pink, no icterus. Mouth: Oropharyngeal mucosa moist and pink , no lesions erythema or exudate. Neck: Supple without thyromegaly, masses, or lymphadenopathy.  Lungs: Clear to auscultation bilaterally.  Heart: Regular rate and rhythm, 3/6 SEM. no rubs or gallops.  Abdomen: Bowel sounds are normal, nontender, nondistended, no hepatosplenomegaly or masses, no abdominal bruits or hernia , no rebound or guarding.   Rectal: not performed Extremities: bilateral BKA.  Neuro: Alert and oriented x 4 , grossly normal neurologically.  Skin: Warm and dry, no rash or jaundice.   Psych: Alert and cooperative, normal mood and affect.        Intake/Output from previous day: 09/24 0701 - 09/25 0700 In: 315 [Blood:315] Out: -  Intake/Output this shift: Total I/O In: 480 [I.V.:3; Blood:477] Out: -   Lab Results:   CBC Recent Labs    12/15/23 2105  WBC 9.0  HGB 5.3*  HCT 18.0*  MCV 85.7  PLT 259   BMET Recent Labs    12/15/23 2105  NA 146*  K 4.1  CL 105  CO2 27  GLUCOSE 260*  BUN 39*  CREATININE 3.97*  CALCIUM  8.2*   LFT Recent Labs    12/15/23 2105  BILITOT 0.5  ALKPHOS 54  AST 13*  ALT <5  PROT 5.0*  ALBUMIN  2.4*    Lipase No results for input(s): LIPASE in the  last 72 hours.  PT/INR Recent Labs    12/15/23 2105  LABPROT 15.4*  INR 1.2     Hepatitis Panel No results for input(s): HEPBSAG, HCVAB, HEPAIGM, HEPBIGM in the last 72 hours.   Imaging Studies:   DG CHEST PORT 1 VIEW Result Date: 12/06/2023 CLINICAL DATA:  Leukocytosis EXAM: PORTABLE CHEST 1 VIEW COMPARISON:  Radiograph 11/10/2023 FINDINGS: Stable large cardiac silhouette. There is increased patchy airspace disease in the LEFT upper lobe and LEFT lower lobe. RIGHT lung clear. No pneumothorax. No acute osseous abnormality. IMPRESSION: New airspace patchy density in the LEFT lung. Findings concerning for multifocal pneumonia. Electronically Signed   By: Jackquline Boxer M.D.   On: 12/06/2023 13:52  [4 week]  Assessment:   68 y/o female with past medical history significant for GERD, pancreatitis, moderate aortic stenosis (ECHO 05/2023) recent new onset A-fib on apixaban  with cardioversion 10/27/2023, PAD (s/p R BKA in 05/2022, prior lithotripsy and stenting of the left superficial femoral and popliteal artery in 06/2022 and ultimately L BKA in 09/2022), HTN, HLD, Type 2 DM, bilateral adrenal adenoma (followed by Endocrinology), chronic HFpEF, OSA, ESRD (MWF), history of TIA's, history of recurrent chest pain  (cath in 12/2018 showing normal coronary arteries) recent hospitalization for left hand I&D, MSSA infection on antibiotics IV Ancef , last admission 9/11 with acute on chronic anemia with gi bleeding. Presenting now with persistent gi bleeding.  Acute on chronic anemia/rectal bleeding in the setting of Eliquis  -ongoing blood per rectum, did not stop after recent hospitalization -colonoscopy/EGD as outlined, suspected right sided diverticular bleed at that time with recommendations to obtain stat CTA GI bleed study and consult IR if active bleeding. -she reports she has been passing blood per rectum since her last admission. It never stopped, and daughter reports it has been  somewhat worse over the past several days -Hgb 7.9 on 9/16 and 5.2 on presentation  Differential includes ongoing stuttering diverticular bleeding, post-polypectomy bleeding, less likely simple hemorrhoidal bleeding with significant drops in her hgb.   Plan:   Follow up pending post-transfusion H/H. CTA GI Bleeding scan may be needed,  potentially low yield given slow bleeding. To discuss with Dr. Cinderella. She is scheduled for HD tomorrow.  Continue to hold Eliquis .    LOS: 0 days   We would like to thank you for the opportunity to participate in the care of Kathryn Beck.  Sonny RAMAN. Ezzard RIGGERS [facility] [facility] [phone] 9/25/202512:13 PM  Addendum: Discussed with Dr. Cinderella. Plan for CTA GI bleed scan due to significant drop in Hgb from 7 to 5. Ongoing rectal bleeding, somewhat slow/stuttering, may be low yield but given previous work up and significant drop, this is next indicated step. Inpatient nephrologist, Dr. Timothy Darden, agreed with approach and will plan to dialyze patient tomorrow. Spoke with daughter, Kathryn Beck, and patient previously about this option and they were agreeable, aware of need to dialyze tomorrow due to contrast.   Sonny RAMAN. Ezzard RIGGERS [facility] [phone] 9/25/20251:07 PM

## 2023-12-16 NOTE — TOC Initial Note (Signed)
 Transition of Care [facility]) - Initial/Assessment Note    Patient Details  Name: Kathryn Beck MRN: [id number] Date of Birth: 23-Jul-1955  Transition of [facility]) CM/SW Contact:    Noreen KATHEE Pinal, LCSWA Phone Number: 12/16/2023, 3:21 PM  Clinical Narrative:                   Patient is at risk for readmission due to high admission score. Patient was admitted for Lower GI bleed. CSW spoke with daughter who reports that patient is independent some, but her and patient spouse assist with her needs. Daughter reports that patient has a shower chair, hoyer lift, hospital bed, WC , and sliding board. Family can assist with transportation depending on DC, if not, EMS will need to be arranged. Daughter anticipates that once patient is medically stable, she will return back home. CSW will continue to follow.    Expected Discharge Plan: Home/Self Care Barriers to Discharge: Continued Medical Work up   Patient Goals and CMS Choice Patient states their goals for this hospitalization and ongoing recovery are:: get better and return home CMS Medicare.gov Compare Post Acute Care list provided to:: Other (Comment Required) (Daughter- Kathryn Beck) Choice offered to / list presented to : Adult Children      Expected Discharge Plan and Services In-house Referral: Clinical Social Work   Post Acute Care Choice: Durable Medical Equipment Living arrangements for the past 2 months: Single Family Home                                      Prior Living Arrangements/Services Living arrangements for the past 2 months: Single Family Home Lives with:: Spouse Patient language and need for interpreter reviewed:: Yes Do you feel safe going back to the place where you live?: Yes      Need for Family Participation in Patient Care: Yes (Comment) Care giver support system in place?: Yes (comment) Current home services: DME Criminal Activity/Legal Involvement Pertinent to Current Situation/Hospitalization:  No - Comment as needed  Activities of Daily Living   ADL Screening (condition at time of admission) Independently performs ADLs?: No Does the patient have a NEW difficulty with bathing/dressing/toileting/self-feeding that is expected to last >3 days?: No Does the patient have a NEW difficulty with getting in/out of bed, walking, or climbing stairs that is expected to last >3 days?: No Does the patient have a NEW difficulty with communication that is expected to last >3 days?: No Is the patient deaf or have difficulty hearing?: No Does the patient have difficulty seeing, even when wearing glasses/contacts?: No Does the patient have difficulty concentrating, remembering, or making decisions?: No  Permission Sought/Granted      Share Information with NAME: Augusto     Permission granted to share info w Relationship: Daughter     Emotional Assessment Appearance:: Appears stated age   Affect (typically observed): Accepting Orientation: : Oriented to Self, Oriented to Place, Oriented to  Time   Psych Involvement: No (comment)  Admission diagnosis:  Hypernatremia [E87.0] Acute GI bleeding [K92.2] Lower GI bleed [K92.2] Acute on chronic blood loss anemia [D62] Patient Active Problem List   Diagnosis Date Noted   Lower GI bleed 12/16/2023   Gangrene (HCC) 12/06/2023   GI bleed 12/01/2023   SIRS (systemic inflammatory response syndrome) (HCC) 11/10/2023   Abscess of left hand 10/29/2023   Afib (HCC) 10/27/2023   Mass of breast 08/30/2023  Depression, major, single episode, moderate (HCC) 08/30/2023   GAD (generalized anxiety disorder) 08/30/2023   Decreased strength, endurance, and mobility 08/30/2023   Gangrene of finger of left hand (HCC) 08/30/2023   Uncontrolled type 2 diabetes mellitus with hyperglycemia (HCC) 08/30/2023   Hearing loss 04/27/2023   Otalgia of both ears 04/27/2023   CVA (cerebral vascular accident) (HCC) 12/17/2022   Acute confusion 12/10/2022    Generalized weakness 11/18/2022   Paronychia of finger of right hand 11/13/2022   Dehiscence of amputation stump of left lower extremity (HCC) 10/07/2022   Status post below-knee amputation of left lower extremity (HCC) 10/07/2022   Subacute osteomyelitis, left ankle and foot (HCC) 09/03/2022   Left foot infection 09/03/2022   Amputated left leg (HCC) 09/03/2022   Wound infection after surgery 09/03/2022   Equinus contracture of left ankle 08/15/2022   Acute osteomyelitis of toe of left foot (HCC) 08/13/2022   Peripheral vascular complication 07/21/2022   PVD (peripheral vascular disease) 07/18/2022   Toe ulcer, left, limited to breakdown of skin (HCC) 07/13/2022   S/P BKA (below knee amputation), right (HCC) 05/29/2022   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   Gangrene of right foot (HCC) 04/25/2022   PAD (peripheral artery disease) 04/25/2022   Other osteoporosis without current pathological fracture 11/10/2021   Hypocalcemia 11/10/2021   Low back pain with left-sided sciatica 10/31/2021   Left ear impacted cerumen 09/18/2021   Morbid obesity (HCC) 03/10/2021   Subungual hematoma of second toe of left foot 10/28/2020   Insomnia 10/28/2020   Memory loss or impairment 09/26/2020   Abnormal MRI, cervical spine 03/04/2020   Right leg weakness 02/28/2020   Knee pain, right 10/23/2019   Carpal tunnel syndrome 06/13/2019   Chronic pain syndrome 06/13/2019   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) 06/13/2019   ESRD on hemodialysis (HCC) 11/22/2018   Ischemic heart disease 09/25/2018   Not currently working due to disabled status 06/13/2018   HTN (hypertension) 06/12/2018   Adrenal mass, left 11/09/2017   MGUS (monoclonal gammopathy of unknown significance) 11/05/2017   Chronic diastolic (congestive) heart failure (HCC) 09/20/2017   Bilateral leg weakness 09/09/2017   Adrenal adenoma 09/03/2017   Severe anemia 03/24/2017   History of colonic polyps  02/10/2017   Dysphagia 11/05/2016   Irritable bowel syndrome 02/04/2016   Cardiac murmur 01/17/2014   Encounter for examination following treatment at hospital 01/15/2014   Back pain with left-sided radiculopathy 09/18/2013   Other seasonal allergic rhinitis 08/22/2012   OSA (obstructive sleep apnea) 06/02/2011   Neuropathy 04/16/2010   LUPUS ERYTHEMATOSUS, DISCOID 06/05/2008   Hyperlipidemia 06/07/2007   Obesity 06/07/2007   Type 2 diabetes mellitus with hyperglycemia (HCC) 06/07/2007   Diabetes mellitus (HCC) 06/07/2007   PCP:  Antonetta Rollene BRAVO, MD Pharmacy:   [facility] - [place], [place] - [address] [place] [address] Phone: [phone] Fax: [phone]     Social Drivers of Health (SDOH) Social History: SDOH Screenings   Food Insecurity: No Food Insecurity (12/16/2023)  Housing: Low Risk  (12/16/2023)  Transportation Needs: No Transportation Needs (12/16/2023)  Utilities: Not At Risk (12/16/2023)  Alcohol Screen: Low Risk  (05/03/2023)  Depression (PHQ2-9): High Risk (11/24/2023)  Financial Resource Strain: Low Risk  (11/24/2023)  Physical Activity: Inactive (11/24/2023)  Social Connections: Moderately Integrated (12/16/2023)  Recent Concern: Social Connections - Moderately Isolated (11/25/2023)  Stress: Stress Concern Present (11/24/2023)  Tobacco Use: Low Risk  (12/15/2023)  Health Literacy: Adequate Health Literacy (05/03/2023)  Recent Concern: Health Literacy - Inadequate Health Literacy (02/08/2023)   SDOH Interventions:     Readmission Risk Interventions    12/16/2023    3:19 PM 12/02/2023   12:48 PM 12/18/2022    3:14 PM  Readmission Risk Prevention Plan  Transportation Screening Complete Complete Complete  PCP or Specialist Appt within 3-5 Days  Not Complete   HRI or Home Care Consult  Complete   Social Work Consult for Recovery Care Planning/Counseling  Complete   Palliative Care Screening  Not Applicable   Medication  Review Oceanographer) Complete Complete Complete  PCP or Specialist appointment within 3-5 days of discharge   Not Complete  HRI or Home Care Consult Complete  Complete  SW Recovery Care/Counseling Consult Complete  Complete  Palliative Care Screening Not Applicable  Not Applicable  Skilled Nursing Facility Not Applicable  Not Applicable

## 2023-12-16 NOTE — H&P (Signed)
 History and Physical    Kathryn Beck FMW:[id number] DOB: 22-Nov-1955 DOA: 12/15/2023  PCP: Antonetta Rollene BRAVO, MD   Patient coming from: Home   Chief Complaint:  Chief Complaint  Patient presents with   Rectal Bleeding    HPI:  Kathryn Beck is a 68 y.o. female with hx of Atrial fib on apixaban , PAD  (s/p R BKA in 05/2022, prior lithotripsy and stenting of the left superficial femoral and popliteal artery in 06/2022 and ultimately L BKA in 09/2022), HTN, HLD, Type 2 DM, bilateral adrenal adenoma (followed by Endocrinology), chronic HFpEF, aortic stenosis, OSA, ESRD (MWF), history of TIA's, history of recurrent chest pain  (cath in 12/2018 showing normal coronary arteries) recent hospitalization for left hand I&D, MSSA infection on antibiotics IV Ancef  with HD, who was recently hospitalized 9/10 - 9/16 for symptomatic anemia and possible GI bleeding and underwent colonoscopy demonstrating diverticulosis and 2 polyps removed, with likely diverticular bleed as source, restarted on [facility] 9/15. Returns with rectal bleeding. Reports that bleeding did not resolve since last admission to hospital. Husband reports mainly dark red blood mixed with stool, but sometimes with dark black stools. Reports frequent episodes sometimes happening every 20 minutes at home. No associated abd pain. Is taking aspirin  162 mg daily, and Eliquis . Confirms no longer taking plavix .      Review of Systems:  ROS complete and negative except as marked above   Allergies  Allergen Reactions   Ace Inhibitors Anaphylaxis and Swelling   Penicillins Itching and Swelling    Has tolerated cefazolin  on multiple occasions    Statins Other (See Comments)    Elevated LFT's   Albuterol  Swelling    Prior to Admission medications   Medication Sig Start Date End Date Taking? Authorizing Provider  acetaminophen  (TYLENOL ) 500 MG tablet Take 1,000 mg by mouth every 6 (six) hours as needed for mild pain (pain score 1-3).     [provider]  amLODipine  (NORVASC ) 10 MG tablet Take 0.5 tablets (5 mg total) by mouth daily. 10/28/23   Pearlean Manus, MD  apixaban  (ELIQUIS ) 5 MG TABS tablet Take 1 tablet (5 mg total) by mouth 2 (two) times daily. For Afib 10/29/23   Pearlean Manus, MD  aspirin  81 MG chewable tablet Chew 2 tablets (162 mg total) by mouth daily. 09/16/23   Bevely Doffing, FNP  B Complex-C-Folic Acid  (RENA-VITE RX) 1 MG TABS Take 1 tablet by mouth daily. 04/15/22   [provider]  busPIRone  (BUSPAR ) 7.5 MG tablet Take 1 tablet (7.5 mg total) by mouth 2 (two) times daily. 11/24/23   Antonetta Rollene BRAVO, MD  calcitRIOL  (ROCALTROL ) 0.5 MCG capsule Take 2 capsules (1 mcg total) by mouth every Monday, Wednesday, and Friday with hemodialysis. 11/05/23   Caleen Burgess BROCKS, MD  CEFAZOLIN  SODIUM IV Inject into the vein every Monday, Wednesday, and Friday with hemodialysis. 11/30/23 01/11/24  [provider]  cinacalcet  (SENSIPAR ) 30 MG tablet Take 3 tablets (90 mg total) by mouth every Monday, Wednesday, and Friday. 12/08/23   Vicci Afton LITTIE, MD  Darbepoetin Alfa  (ARANESP ) 40 MCG/0.4ML SOSY injection Inject 0.4 mLs (40 mcg total) into the skin every Friday at 6 PM. Patient taking differently: Inject 40 mcg into the vein every Friday at 6 PM. 11/05/23   Amin, Ankit C, MD  ezetimibe  (ZETIA ) 10 MG tablet Take 1 tablet (10 mg total) by mouth daily. 04/27/23   Antonetta Rollene BRAVO, MD  insulin  aspart (NOVOLOG ) 100 UNIT/ML injection Inject 0-6 Units  into the skin 3 (three) times daily with meals. CBG 70 - 120: 0 units  CBG 121 - 150: 0 units  CBG 151 - 200: 1 unit  CBG 201-250: 2 units  CBG 251-300: 3 units  CBG 301-350: 4 units  CBG 351-400: 5 units  CBG > 400: Give 10 units and call MD 11/12/23   Shahmehdi, Seyed A, MD  insulin  glargine, 2 Unit Dial , (TOUJEO  MAX SOLOSTAR) 300 UNIT/ML Solostar Pen Inject 30 Units into the skin daily. May need to slowly increase the dose depending upon your blood sugar,  follow-up with PCP 01/01/23   Trixie File, MD  metoprolol  succinate (TOPROL -XL) 50 MG 24 hr tablet TAKE (1) TABLET BY MOUTH DAILY WITH FOOD *TAKE AFTER DIALYSIS* Patient taking differently: Take 50 mg by mouth every Tuesday, Thursday, Saturday, and Sunday. Non-dialysis days 01/05/23   Antonetta Rollene BRAVO, MD  ondansetron  (ZOFRAN ) 4 MG tablet Take 4 mg by mouth every 8 (eight) hours as needed for nausea or vomiting. 11/26/23   [provider]  pantoprazole  (PROTONIX ) 40 MG tablet Take 1 tablet (40 mg total) by mouth daily. 12/08/23   Johnson, Clanford L, MD  sertraline  (ZOLOFT ) 50 MG tablet Take one and a half tablets once daily Patient taking differently: Take 75 mg by mouth daily. Take one and a half tablets once daily 11/24/23   Antonetta Rollene BRAVO, MD  torsemide  (DEMADEX ) 100 MG tablet Take 1 tablet (100 mg total) by mouth every Tuesday, Thursday, Saturday, and Sunday. Patient taking differently: Take 100 mg by mouth every Tuesday, Thursday, Saturday, and Sunday. Non-dialysis days 10/28/23   Pearlean Manus, MD  [name]  500 MG chewable tablet Chew 1-2 tablets (500-1,000 mg total) by mouth See admin instructions. Take 1000mg  (2 tablets) by mouth with meals and 500mg  (1 tablet) with snacks 08/23/23   Antonetta Rollene BRAVO, MD  FLUoxetine (PROZAC) 10 MG capsule Take 10 mg by mouth daily.    05/28/11  [provider]  glipiZIDE  (GLUCOTROL ) 10 MG tablet Take 10 mg by mouth 2 (two) times daily before a meal.    05/28/11  [provider]    Past Medical History:  Diagnosis Date   Acid reflux    Amputated left leg (HCC)    bilateral BKA   Anemia    Arthritis    Axillary masses    Soft tissue - status post excision   Back pain    CHF (congestive heart failure) (HCC)    COVID-19 virus infection 04/06/2019   Depression    End-stage renal disease (HCC)    M/W/F dialysis   Essential hypertension    Headache    years ago   History of blood transfusion    History of cardiac  catheterization    Normal coronary arteries October 2020   History of claustrophobia    History of pneumonia 2019   Hypoxia 04/03/2019   Memory loss    Mixed hyperlipidemia    Obesity    Pancreatitis    Peritoneal dialysis catheter in place    Pneumonia due to COVID-19 virus 04/02/2019   Sleep apnea    Noncompliant with CPAP   Stroke (HCC)    mini stroke   Type 2 diabetes mellitus (HCC)     Past Surgical History:  Procedure Laterality Date   ABDOMINAL AORTOGRAM W/LOWER EXTREMITY N/A 04/30/2022   Procedure: ABDOMINAL AORTOGRAM W/LOWER EXTREMITY;  Surgeon: Magda Debby SAILOR, MD;  [facility]: [facility];  Service: Cardiovascular;  Laterality: N/A;  ABDOMINAL AORTOGRAM W/LOWER EXTREMITY N/A 07/21/2022   Procedure: ABDOMINAL AORTOGRAM W/LOWER EXTREMITY;  Surgeon: Serene Gaile ORN, MD;  Location: [facility]VAS[facility];  Service: Cardiovascular;  Laterality: N/A;   ABDOMINAL HYSTERECTOMY     ACHILLES TENDON LENGTHENING  08/15/2022   Procedure: ACHILLES TENDON LENGTHENING;  Surgeon: Silva Juliene SAUNDERS, DPM;  Location: [facility] OR;  Service: Podiatry;;   AMPUTATION Right 05/29/2022   Procedure: RIGHT BELOW THE KNEE AMPUTATION;  Surgeon: Harden Jerona GAILS, MD;  Location: [facility] OR;  Service: Orthopedics;  Laterality: Right;   AMPUTATION Left 09/04/2022   Procedure: AMPUTATION FOOT, serial irrigation;  Surgeon: Joya Stabs, DPM;  Location: [facility] OR;  Service: Podiatry;  Laterality: Left;  Surgical team to do block   AMPUTATION Left 10/07/2022   Procedure: LEFT BELOW KNEE AMPUTATION;  Surgeon: Harden Jerona GAILS, MD;  Location: [facility] OR;  Service: Orthopedics;  Laterality: Left;   AMPUTATION FINGER Left 09/29/2023   Procedure: AMPUTATION, FINGER;  Surgeon: Harden Jerona GAILS, MD;  Location: [facility] OR;  Service: Orthopedics;  Laterality: Left;  LEFT HAND LONG FINGER RAY AMPUTATION   AV FISTULA PLACEMENT Left 09/02/2017   Procedure: creation of left arm ARTERIOVENOUS (AV) FISTULA;  Surgeon: Serene Gaile ORN, MD;  Location: [facility] OR;   Service: Vascular;  Laterality: Left;   COLONOSCOPY  2008   Dr. Harvey: normal    COLONOSCOPY N/A 12/18/2016   Dr. Harvey: multiple tubular adenomas, internal hemorrhoids. Surveillance in 3 years    COLONOSCOPY N/A 12/05/2023   Procedure: COLONOSCOPY;  Surgeon: Cindie Carlin POUR, DO;  Location: [facility];  Service: Endoscopy;  Laterality: N/A;   ESOPHAGEAL DILATION N/A 10/13/2015   Procedure: ESOPHAGEAL DILATION;  Surgeon: Claudis RAYMOND Rivet, MD;  Location: [facility];  Service: Endoscopy;  Laterality: N/A;   ESOPHAGEAL DILATION N/A 12/03/2023   Procedure: DILATION, ESOPHAGUS;  Surgeon: Cindie Carlin POUR, DO;  [facility]: [facility];  Service: Endoscopy;  Laterality: N/A;   ESOPHAGOGASTRODUODENOSCOPY N/A 10/13/2015   Dr. Rivet: chronic gastritis on path, no H.pylori. Empiric dilation    ESOPHAGOGASTRODUODENOSCOPY N/A 12/18/2016   Dr. Harvey: mild gastritis. BRAVO study revealed uncontrolled GERD. Dysphagia secondary to uncontrolled reflux   ESOPHAGOGASTRODUODENOSCOPY N/A 12/03/2023   Procedure: EGD (ESOPHAGOGASTRODUODENOSCOPY);  Surgeon: Cindie Carlin POUR, DO;  [facility]: [facility];  Service: Endoscopy;  Laterality: N/A;   FOOT SURGERY Bilateral    nerve     INCISION AND DRAINAGE OF WOUND Left 10/30/2023   Procedure: IRRIGATION AND DEBRIDEMENT WOUND;  Surgeon: Arlinda Buster, MD;  Location: [facility] OR;  Service: Orthopedics;  Laterality: Left;  LEFT HAND WOUND   LEFT HEART CATH AND CORONARY ANGIOGRAPHY N/A 12/29/2018   Procedure: LEFT HEART CATH AND CORONARY ANGIOGRAPHY;  Surgeon: Dann Candyce RAMAN, MD;  Location: [facility] INVASIVE CV LAB;  Service: Cardiovascular;  Laterality: N/A;   LOWER EXTREMITY ANGIOGRAPHY Right 05/04/2022   Procedure: Lower Extremity Angiography;  Surgeon: Lanis Fonda BRAVO, MD;  Location: [facility] INVASIVE CV LAB;  Service: Cardiovascular;  Laterality: Right;   LUNG BIOPSY     MASS EXCISION Right 01/09/2013   Procedure: EXCISION OF NEOPLASM OF RIGHT  AXILLA  AND EXCISION OF  NEOPLASM OF LEFT AXILLA;  Surgeon: Oneil DELENA Budge, MD;  Location: [facility] ORS;  Service: General;  Laterality: Right;  procedure end @ 08:23   MYRINGOTOMY WITH TUBE PLACEMENT Bilateral 04/28/2017   Procedure: BILATERAL MYRINGOTOMY WITH TUBE PLACEMENT;  Surgeon: Karis Clunes, MD;  Location: [facility] OR;  Service: ENT;  Laterality: Bilateral;   PERIPHERAL VASCULAR  BALLOON ANGIOPLASTY Right 05/04/2022   Procedure: PERIPHERAL VASCULAR BALLOON ANGIOPLASTY;  Surgeon: Lanis Fonda BRAVO, MD;  Location: [facility] INVASIVE CV LAB;  Service: Cardiovascular;  Laterality: Right;  PT   PERIPHERAL VASCULAR INTERVENTION Right 05/04/2022   Procedure: PERIPHERAL VASCULAR INTERVENTION;  Surgeon: Lanis Fonda BRAVO, MD;  Location: [facility] INVASIVE CV LAB;  Service: Cardiovascular;  Laterality: Right;  SFA   PERIPHERAL VASCULAR INTERVENTION Left 07/21/2022   Procedure: PERIPHERAL VASCULAR INTERVENTION;  Surgeon: Serene Gaile ORN, MD;  [facility]: [facility];  Service: Cardiovascular;  Laterality: Left;   REVISION OF ARTERIOVENOUS GORETEX GRAFT Left 05/04/2018   Procedure: TRANSPOSITION OF CEPHALIC VEIN ARTERIOVENOUS FISTULA LEFT ARM;  Surgeon: Oris Krystal FALCON, MD;  Location: [facility] OR;  Service: Vascular;  Laterality: Left;   SAVORY DILATION N/A 12/18/2016   Procedure: SAVORY DILATION;  Surgeon: Harvey Margo CROME, MD;  [facility];  Service: Endoscopy;  Laterality: N/A;   TRANSMETATARSAL AMPUTATION Left 08/15/2022   Procedure: TRANSMETATARSAL AMPUTATION;  Surgeon: Silva Juliene SAUNDERS, DPM;  Location: [facility] OR;  Service: Podiatry;  Laterality: Left;     reports that she has never smoked. She has never been exposed to tobacco smoke. She has never used smokeless tobacco. She reports that she does not drink alcohol and does not use drugs.  Family History  Problem Relation Age of Onset   Hypertension Father    Hypercholesterolemia Father    Arthritis Father    Hypertension Sister    Hypercholesterolemia Sister    Breast cancer Sister     Hypertension Sister    Colon cancer Neg Hx    Colon polyps Neg Hx      Physical Exam: Vitals:   12/16/23 0000 12/16/23 0015 12/16/23 0017 12/16/23 0030  BP: (!) 157/51 (!) 153/57 (!) 152/65   Pulse: 72 69 68 68  Resp: 13 17 (!) 9 14  Temp:   97.8 F (36.6 C)   TempSrc:   Oral   SpO2: 99% 94% 99% 98%  Weight:      Height:        Gen: Awake, alert, chronically ill appearing.   CV: Regular, normal S1, S2, 2/6 SEM  Resp: Normal WOB, CTAB  Abd: Flat, normoactive, nontender MSK: Bilateral BKA  Skin: L hand with dressing in place, not removed for exam.  Neuro: Alert and interactive  Psych: euthymic, appropriate    Data review:   Labs reviewed, notable for:   NA 146 Blood glucose 2 6 Creatinine 3.97 (ESRD) Lactate 1.9 WBC 9 Hemoglobin 5.3 (down from 7.9 on 9/16) Platelet 259 INR 1.2   Micro:  Results for orders placed or performed during the hospital encounter of 11/10/23  Urine Culture     Status: None   Collection Time: 11/10/23 11:01 AM   Specimen: In/Out Cath Urine  Result Value Ref Range Status   Specimen Description   Final    IN/OUT CATH URINE Performed at [facility], [address] [place], [place] [address]    Special Requests   Final    NONE Performed at [facility], [address] [place], [place] [address]    Culture   Final    NO GROWTH Performed at [facility], [address], [place], [place] [address]    Report Status 11/11/2023 FINAL  Final  Culture, blood (Routine X 2) w Reflex to ID Panel     Status: None   Collection Time: 11/10/23  1:01 PM   Specimen: BLOOD  Result  Value Ref Range Status   Specimen Description BLOOD BLOOD RIGHT ARM  Final   Special Requests   Final    Blood Culture adequate volume BOTTLES DRAWN AEROBIC AND ANAEROBIC   Culture   Final    NO GROWTH 5 DAYS Performed at [facility], [address] [place], [place] [address]    Report Status 11/15/2023 FINAL  Final  Culture, blood (Routine X 2) w Reflex to  ID Panel     Status: None   Collection Time: 11/10/23  1:01 PM   Specimen: BLOOD  Result Value Ref Range Status   Specimen Description BLOOD BLOOD RIGHT HAND  Final   Special Requests   Final    BOTTLES DRAWN AEROBIC ONLY Blood Culture results may not be optimal due to an inadequate volume of blood received in culture bottles   Culture   Final    NO GROWTH 5 DAYS Performed at [facility], [address], [place], [place] [address]    Report Status 11/15/2023 FINAL  Final   *Note: Due to a large number of results and/or encounters for the requested time period, some results have not been displayed. A complete set of results can be found in Results Review.    Imaging reviewed:  No results found.   ED Course:  EDP consulted with GI Dr. Shaaron who will consult on the patient in the morning.  Ordered for 3 unit RBC. And pantoprazole  80 mg IV with 40 mg BID to continue in AM.    Assessment/Plan:  68 y.o. female with hx Atrial fib on apixaban , PAD  (s/p R BKA in 05/2022, prior lithotripsy and stenting of the left superficial femoral and popliteal artery in 06/2022 and ultimately L BKA in 09/2022), HTN, HLD, Type 2 DM, bilateral adrenal adenoma (followed by Endocrinology), chronic HFpEF, aortic stenosis, OSA, ESRD (MWF), history of TIA's, history of recurrent chest pain  (cath in 12/2018 showing normal coronary arteries) recent hospitalization for left hand I&D, MSSA infection on antibiotics IV Ancef  with HD, who was recently hospitalized 9/10 - 9/16 for symptomatic anemia and possible GI bleeding and underwent colonoscopy demonstrating diverticulosis and 2 polyps removed, with likely diverticular bleed as source, restarted on [facility] 9/15. Returns with hematochezia, and recurrent severe symptomatic anemia   Lower GI bleeding  Severe symptomatic anemia, with component of acute blood loss  P/w continued dark red rectal bleeding, possible melena as well. Gross hematochezia noted in the ED.  Hemodynamically stable. Hemoglobin 5.3 (down from 7.9 on 9/16). Recent hx EGD 9/12 with normal esophagus and duodenum, medium amount of food residue in stomach although no active or stigmata of bleeding noted. colonoscopy on 9/14 with nonbleeding internal hemorrhoids, diverticulosis, 2 polyps which were removed, suspected diverticular bleed last admission.  - EDP consulted with GI Dr. Shaaron who will consult on the patient in the morning. NPO for now  - Ordered for 3 unit RBC -- Serial CBC/Hb q8 hr for now  - Hold home apixaban , if she has continued large volume bleeding or hemodynamic changes would reverse with [facility]  -- Less likely upper GI bleed, unclear if GI has requested PPI will leave onboard for now  - Considering recurrent bleeding and severe anemia would temporarily discontinue her anticoagulation and have her follow-up outpatient to consider restarting.  Paroxysmal Atrial fib on apixaban  Hx new onset A-fib in 8/'25, required DCCV in the ED, echo with grade 1 diastolic dysfunction, severe dilation of left atria, moderate AS. CHA2DS2-VASc of 7; was started  on anticoagulation that admission, and continued on aspirin  for hx PAD and stents. Plavix  was discontinued.  -- See above, recurrent bleeding for now [facility] and antiPLT on hold.  -- Continue home Metoprolol  50 mg QIW on nonHD days  -- favor discontinuation of anticoagulation at discharge + continuation of aspirin  with f/u as outpatient to consider restarting. May consider for watchman if she is a candidate for this procedure.   ESRD on MWF HD  -- Routine nephrology consult in AM for HD management  - Continue home torsemide  on non-HD days, home [name] , calcitriol , sensipar    Recent hx of dry gangrene L hand  Possible SSTI v underlying osteomyelitis  -- Follows with ID, orthopedics outpatient; may require amputation per OP review  -- Continue Cefazolin  2 g IV three times per week after HD.  -- Will continue wound care, photograph L hand  wound   Chronic medical problems:  PAD s/p R BKA in 05/2022, prior lithotripsy and stenting of the left superficial femoral and popliteal artery in 06/2022 and ultimately L BKA in 09/2022: Hold antiPLT in setting of bleed, resume aspirin  as soon as able.  HTN: Hold amlodipine  in setting of bleed.  HLD: Continue home zetia   Type 2 DM: Continue home glargine 30 units daily, add SSI for very sensitive, at bedtime correction. bilateral adrenal adenoma: followed by Endocrinology, outpatient follow-up chronic HFpEF: Without acute exacerbation, continue home torsemide  on on HD days per above aortic stenosis: History of moderate AS, echo surveillance outpatient OSA: not currently using CPAP   History of TIA's: See antiplatelet and anticoagulant management per above history of recurrent chest pain: cath in 12/2018 showing normal coronary arteries Mood d/o: Continue home sertraline   Body mass index is 27.51 kg/m.    DVT prophylaxis:  SCDs Code Status:  Full Code Diet:  Diet Orders (From admission, onward)     Start     Ordered   12/15/23 2040  Diet NPO time specified  Diet effective now        12/15/23 2039           Family Communication:  Yes discussed with husband at bedside   Consults:  GI   Admission status:   Inpatient, Step Down Unit  Severity of Illness: The appropriate patient status for this patient is INPATIENT. Inpatient status is judged to be reasonable and necessary in order to provide the required intensity of service to ensure the patient's safety. The patient's presenting symptoms, physical exam findings, and initial radiographic and laboratory data in the context of their chronic comorbidities is felt to place them at high risk for further clinical deterioration. Furthermore, it is not anticipated that the patient will be medically stable for discharge from the hospital within 2 midnights of admission.   * I certify that at the point of admission it is my clinical  judgment that the patient will require inpatient hospital care spanning beyond 2 midnights from the point of admission due to high intensity of service, high risk for further deterioration and high frequency of surveillance required.*   Dorn Dawson, MD [facility]  How to contact the [facility] Attending or Consulting provider 7A - 7P or covering provider during after hours 7P -7A, for this patient.  Check the care team in [facility] and look for a) attending/consulting [facility] provider listed and b) the [facility] team listed Log into [email or web address] and use [facility] universal password to access. If you do not have the password, please contact the hospital operator. Locate  the [facility] provider you are looking for under [facility] and page to a number that you can be directly reached. If you still have difficulty reaching the provider, please page the [facility] (Director on Call) for the Hospitalists listed on amion for assistance.  12/16/2023, 12:41 AM

## 2023-12-16 NOTE — Consult Note (Signed)
 WOC Nurse Consult Note: Consult requested for left hand.  Pt is familiar to [facility] team from previous admission on 8/21.  She has chronic gangrene and post-op wounds related to a previous finger amputation surgery. She is followed by Ortho care hand surgery team prior to admission, most recently on 9/10.  They are awaiting the wound to demarcate before they develop a further plan of care. The line of gangrene ends above the wrist at this time. Left anterior and sides of hand is 90% dry eschar, 10% yellow slough.  Mod amt tan drainage, no odor. Affected area is approx 7X15X.2cm.  Dressing procedure/placement/frequency: Continue current plan of care as previously ordered by the hand surgery team.Topical treatment orders provided for bedside nurses to perform as follows: Apply Silvadene  cream to left hand Q day, then cover with ABD pad and kerlex and ace wrap.  Wipe previous cream off each day using moist gauze before applying more.   Pt should follow-up with the hand surgery team after discharge.   Please re-consult if further assistance is needed.  Thank-you,  Stephane Fought MSN, RN, CWOCN, CWCN-AP, CNS Contact Mon-Fri 0700-1500: [phone]

## 2023-12-16 NOTE — Progress Notes (Signed)
 Pt receives out-pt HD at [facility], MWF 0700 chair time. Will assist as needed.    [facility] Kathryn Beck Dialysis Navigator [phone]

## 2023-12-16 NOTE — ED Notes (Signed)
 Pt had small dark red bloody BM. Peri care peformed and brief changed.

## 2023-12-16 NOTE — H&P (View-Only) (Signed)
 Gastroenterology Consult   Referring Provider: No ref. provider found Primary Care Physician:  Antonetta Rollene BRAVO, MD Primary Gastroenterologist:  formerly Dr. Harvey  Patient ID: Kathryn Beck; [id number]; 09-26-1955   Admit date: 12/15/2023  LOS: 0 days   Date of Consultation: 12/16/2023  Reason for Consultation:  gi bleeding    History of Present Illness   Kathryn Beck is a 68 y.o. female with past medical history significant for GERD, pancreatitis, moderate aortic stenosis (ECHO 05/2023) recent new onset A-fib on apixaban  with cardioversion 10/27/2023, PAD (s/p R BKA in 05/2022, prior lithotripsy and stenting of the left superficial femoral and popliteal artery in 06/2022 and ultimately L BKA in 09/2022), HTN, HLD, Type 2 DM, bilateral adrenal adenoma (followed by Endocrinology), chronic HFpEF, OSA, ESRD (MWF), history of TIA's, history of recurrent chest pain  (cath in 12/2018 showing normal coronary arteries) recent hospitalization for left hand I&D, MSSA infection on antibiotics IV Ancef , last admission 9/11 with acute on chronic anemia with gi bleeding. Presenting now with persistent gi bleeding.  ED course: Sodium 146, glucose 260, BUN 39, creatinine 3.97, albumin  2.4, 5.3 down from 7.9 (nine days ago), MCV 85.7, platelets 259  She received 2 units of packed red blood cells earlier this morning, third unit currently infusing.  GI consult: During last admission patient reported hard infrequent stools with occasional bright red blood per rectum.  Chronically has black stools in the setting of [name] .  Stools were Hemoccult positive.  Also complained of solid food dysphagia. She completed EGD/colonoscopy as outlined below.   After discharge, patient and daughter Claire Husband) report that patient never really stopped bleeding after last hospitalization. Over the past few days however, Tish reports that the bleeding seemed to be increasing. Stools are dark to black chronically  due to her medication. But she is also passing dark red blood. They see the blood in her pull up. She has had several episodes each day at home. Since arrival, she has had several small bloody stools. Last blood per rectum noted around 9:45 by nurse, has small clots.   She denies abdominal pain. No n/v. No heartburn. Her bowels have been moving more regularly than in the past.   Her last dose of Eliquis  was Tuesday night.    Colonoscopy December 05, 2023: - Nonbleeding internal hemorrhoids -Diverticulosis in the sigmoid colon, hepatic flexure, ascending colon -Single 10 mm polyp in the ascending colon removed, tubular adenoma -Single 5 mm polyp in the transverse colon removed, tubular adenoma -Likely left from right sided diverticular disease versus benign hemorrhoidal. -If evidence of further bleeding, would recommend CT angio and consider IR consultation - Next colonoscopy in 3 years  EGD December 03, 2023 - Normal esophagus - Medium amount of food residue in stomach - Normal duodenal bulb, first portion of duodenum and second portion of duodenum  EGD 06/2017 at [facility]: op note not viewable Path-mild chronic gastritis and no intestinal metaplasia, reactive folveolar hyperplasia, no h.pylori. negative proximal and distal esophageal biopsies.    EGD 11/2016: Dr. Harvey -no source for dysphagia -mild gastritis -bravo placed, omeprazole  does not control acid reflux   Colonoscopy 11/2016: -3 polyps removed from the rectum, tubular adenomas -redundant colon -internal hemorrhoids -next colonoscopy in 3 years     Prior to Admission medications   Medication Sig Start Date End Date Taking? Authorizing Provider  acetaminophen  (TYLENOL ) 500 MG tablet Take 1,000 mg by mouth every 6 (six) hours as needed for mild  pain (pain score 1-3).    [provider]  amLODipine  (NORVASC ) 10 MG tablet Take 0.5 tablets (5 mg total) by mouth daily. 10/28/23   Pearlean Manus, MD   apixaban  (ELIQUIS ) 5 MG TABS tablet Take 1 tablet (5 mg total) by mouth 2 (two) times daily. For Afib 10/29/23   Pearlean Manus, MD  aspirin  81 MG chewable tablet Chew 2 tablets (162 mg total) by mouth daily. 09/16/23   Bevely Doffing, FNP  B Complex-C-Folic Acid  (RENA-VITE RX) 1 MG TABS Take 1 tablet by mouth daily. 04/15/22   [provider]  busPIRone  (BUSPAR ) 7.5 MG tablet Take 1 tablet (7.5 mg total) by mouth 2 (two) times daily. 11/24/23   Antonetta Rollene BRAVO, MD  calcitRIOL  (ROCALTROL ) 0.5 MCG capsule Take 2 capsules (1 mcg total) by mouth every Monday, Wednesday, and Friday with hemodialysis. 11/05/23   Caleen Burgess BROCKS, MD  CEFAZOLIN  SODIUM IV Inject into the vein every Monday, Wednesday, and Friday with hemodialysis. 11/30/23 01/11/24  [provider]  cinacalcet  (SENSIPAR ) 30 MG tablet Take 3 tablets (90 mg total) by mouth every Monday, Wednesday, and Friday. 12/08/23   Vicci Afton CROME, MD  Darbepoetin Alfa  (ARANESP ) 40 MCG/0.4ML SOSY injection Inject 0.4 mLs (40 mcg total) into the skin every Friday at 6 PM. Patient taking differently: Inject 40 mcg into the vein every Friday at 6 PM. 11/05/23   Amin, Ankit C, MD  ezetimibe  (ZETIA ) 10 MG tablet Take 1 tablet (10 mg total) by mouth daily. 04/27/23   Antonetta Rollene BRAVO, MD  insulin  aspart (NOVOLOG ) 100 UNIT/ML injection Inject 0-6 Units into the skin 3 (three) times daily with meals. CBG 70 - 120: 0 units  CBG 121 - 150: 0 units  CBG 151 - 200: 1 unit  CBG 201-250: 2 units  CBG 251-300: 3 units  CBG 301-350: 4 units  CBG 351-400: 5 units  CBG > 400: Give 10 units and call MD 11/12/23   Shahmehdi, Seyed A, MD  insulin  glargine, 2 Unit Dial , (TOUJEO  MAX SOLOSTAR) 300 UNIT/ML Solostar Pen Inject 30 Units into the skin daily. May need to slowly increase the dose depending upon your blood sugar, follow-up with PCP 01/01/23   Trixie File, MD  metoprolol  succinate (TOPROL -XL) 50 MG 24 hr tablet TAKE (1) TABLET BY MOUTH DAILY  WITH FOOD *TAKE AFTER DIALYSIS* Patient taking differently: Take 50 mg by mouth every Tuesday, Thursday, Saturday, and Sunday. Non-dialysis days 01/05/23   Antonetta Rollene BRAVO, MD  ondansetron  (ZOFRAN ) 4 MG tablet Take 4 mg by mouth every 8 (eight) hours as needed for nausea or vomiting. 11/26/23   [provider]  pantoprazole  (PROTONIX ) 40 MG tablet Take 1 tablet (40 mg total) by mouth daily. 12/08/23   Johnson, Clanford L, MD  sertraline  (ZOLOFT ) 50 MG tablet Take one and a half tablets once daily Patient taking differently: Take 75 mg by mouth daily. Take one and a half tablets once daily 11/24/23   Antonetta Rollene BRAVO, MD  torsemide  (DEMADEX ) 100 MG tablet Take 1 tablet (100 mg total) by mouth every Tuesday, Thursday, Saturday, and Sunday. Patient taking differently: Take 100 mg by mouth every Tuesday, Thursday, Saturday, and Sunday. Non-dialysis days 10/28/23   Pearlean Manus, MD  [name]  500 MG chewable tablet Chew 1-2 tablets (500-1,000 mg total) by mouth See admin instructions. Take 1000mg  (2 tablets) by mouth with meals and 500mg  (1 tablet) with snacks 08/23/23   Antonetta Rollene BRAVO, MD  FLUoxetine (PROZAC) 10 MG capsule  Take 10 mg by mouth daily.    05/28/11  [provider]  glipiZIDE  (GLUCOTROL ) 10 MG tablet Take 10 mg by mouth 2 (two) times daily before a meal.    05/28/11  [provider]    Current Facility-Administered Medications  Medication Dose Route Frequency Provider Last Rate Last Admin   0.9 %  sodium chloride  infusion (Manually program via Guardrails IV Fluids)   Intravenous Once Franklyn Sid SAILOR, MD       busPIRone  (BUSPAR ) tablet 7.5 mg  7.5 mg Oral BID Segars, Jonathan, MD   7.5 mg at 12/16/23 0935   [START ON 12/17/2023] calcitRIOL  (ROCALTROL ) capsule 1 mcg  1 mcg Oral Q M,W,F-HD Segars, Jonathan, MD       NOREEN ON 12/17/2023] ceFAZolin  (ANCEF ) IVPB 2g/100 mL premix  2 g Intravenous Q M,W,F-HD Darden, Timothy M, DO       Chlorhexidine  Gluconate Cloth 2 %  PADS 6 each  6 each Topical Q0600 Shah, Pratik D, DO   6 each at 12/16/23 9068   Chlorhexidine  Gluconate Cloth 2 % PADS 6 each  6 each Topical Q0600 Darden, Timothy M, DO       [START ON 12/17/2023] cinacalcet  (SENSIPAR ) tablet 90 mg  90 mg Oral Q M,W,F Segars, Dorn, MD       ezetimibe  (ZETIA ) tablet 10 mg  10 mg Oral Daily Segars, Jonathan, MD   10 mg at 12/16/23 9062   insulin  aspart (novoLOG ) injection 0-5 Units  0-5 Units Subcutaneous QHS Segars, Jonathan, MD       insulin  aspart (novoLOG ) injection 0-6 Units  0-6 Units Subcutaneous TID WC Segars, Dorn, MD       metoprolol  succinate (TOPROL -XL) 24 hr tablet 50 mg  50 mg Oral Q T,Th,S,Su Segars, Jonathan, MD   50 mg at 12/16/23 0946   ondansetron  (ZOFRAN ) injection 4 mg  4 mg Intravenous Q8H PRN Franklyn Sid SAILOR, MD   4 mg at 12/15/23 2228   pantoprazole  (PROTONIX ) injection 40 mg  40 mg Intravenous Q12H Naasz, Hayley N, MD   40 mg at 12/16/23 0935   sertraline  (ZOLOFT ) tablet 75 mg  75 mg Oral Daily Segars, Jonathan, MD   75 mg at 12/16/23 9062   silver  sulfADIAZINE  (SILVADENE ) 1 % cream   Topical Daily Maree Bracken D, DO   Given at 12/16/23 1208   sodium chloride  flush (NS) 0.9 % injection 3 mL  3 mL Intravenous Q12H Segars, Jonathan, MD   3 mL at 12/16/23 9065   sucroferric oxyhydroxide ([name] ) chewable tablet 1,000 mg  1,000 mg Oral TID with meals Segars, Jonathan, MD       sucroferric oxyhydroxide ([name] ) chewable tablet 500 mg  500 mg Oral With snacks Maree, Pratik D, DO       torsemide  (DEMADEX ) tablet 100 mg  100 mg Oral Q T,Th,S,Su Segars, Jonathan, MD   100 mg at 12/16/23 1020   Facility-Administered Medications Ordered in Other Encounters  Medication Dose Route Frequency Provider Last Rate Last Admin   0.9 %  sodium chloride  infusion   Intravenous Continuous Lockamy, Randi L, NP-C 20 mL/hr at 12/05/23 1000 Continued from Pre-op  at 12/05/23 1000    Allergies as of 12/15/2023 - Review Complete 12/15/2023  Allergen  Reaction Noted   Ace inhibitors Anaphylaxis and Swelling 06/07/2007   Penicillins Itching and Swelling 06/07/2007   Statins Other (See Comments) 12/24/2012   Albuterol  Swelling 08/31/2017    Past Medical History:  Diagnosis Date   Acid  reflux    Amputated left leg (HCC)    bilateral BKA   Anemia    Arthritis    Axillary masses    Soft tissue - status post excision   Back pain    CHF (congestive heart failure) (HCC)    COVID-19 virus infection 04/06/2019   Depression    End-stage renal disease (HCC)    M/W/F dialysis   Essential hypertension    Headache    years ago   History of blood transfusion    History of cardiac catheterization    Normal coronary arteries October 2020   History of claustrophobia    History of pneumonia 2019   Hypoxia 04/03/2019   Memory loss    Mixed hyperlipidemia    Obesity    Pancreatitis    Peritoneal dialysis catheter in place    Pneumonia due to COVID-19 virus 04/02/2019   Sleep apnea    Noncompliant with CPAP   Stroke (HCC)    mini stroke   Type 2 diabetes mellitus (HCC)     Past Surgical History:  Procedure Laterality Date   ABDOMINAL AORTOGRAM W/LOWER EXTREMITY N/A 04/30/2022   Procedure: ABDOMINAL AORTOGRAM W/LOWER EXTREMITY;  Surgeon: Magda Debby SAILOR, MD;  Location: [facility] IN[facility];  Service: Cardiovascular;  Laterality: N/A;   ABDOMINAL AORTOGRAM W/LOWER EXTREMITY N/A 07/21/2022   Procedure: ABDOMINAL AORTOGRAM W/LOWER EXTREMITY;  Surgeon: Serene Gaile ORN, MD;  Location: [facility] CV [facility];  Service: Cardiovascular;  Laterality: N/A;   ABDOMINAL HYSTERECTOMY     ACHILLES TENDON LENGTHENING  08/15/2022   Procedure: ACHILLES TENDON LENGTHENING;  Surgeon: Silva Juliene SAUNDERS, DPM;  Location: [facility] OR;  Service: Podiatry;;   AMPUTATION Right 05/29/2022   Procedure: RIGHT BELOW THE KNEE AMPUTATION;  Surgeon: Harden Jerona GAILS, MD;  Location: [facility] OR;  Service: Orthopedics;  Laterality: Right;   AMPUTATION Left 09/04/2022   Procedure: AMPUTATION  FOOT, serial irrigation;  Surgeon: Joya Stabs, DPM;  Location: [facility] OR;  Service: Podiatry;  Laterality: Left;  Surgical team to do block   AMPUTATION Left 10/07/2022   Procedure: LEFT BELOW KNEE AMPUTATION;  Surgeon: Harden Jerona GAILS, MD;  Location: [facility] OR;  Service: Orthopedics;  Laterality: Left;   AMPUTATION FINGER Left 09/29/2023   Procedure: AMPUTATION, FINGER;  Surgeon: Harden Jerona GAILS, MD;  Location[facility] OR;  Service: Orthopedics;  Laterality: Left;  LEFT HAND LONG FINGER RAY AMPUTATION   AV FISTULA PLACEMENT Left 09/02/2017   Procedure: creation of left arm ARTERIOVENOUS (AV) FISTULA;  Surgeon: Serene Gaile ORN, MD;  Location: [facility] OR;  Service: Vascular;  Laterality: Left;   COLONOSCOPY  2008   Dr. Harvey: normal    COLONOSCOPY N/A 12/18/2016   Dr. Harvey: multiple tubular adenomas, internal hemorrhoids. Surveillance in 3 years    COLONOSCOPY N/A 12/05/2023   Procedure: COLONOSCOPY;  Surgeon: Cindie Carlin POUR, DO;  Location: [facility];  Service: Endoscopy;  Laterality: N/A;   ESOPHAGEAL DILATION N/A 10/13/2015   Procedure: ESOPHAGEAL DILATION;  Surgeon: Claudis RAYMOND Rivet, MD;  Location: [facility];  Service: Endoscopy;  Laterality: N/A;   ESOPHAGEAL DILATION N/A 12/03/2023   Procedure: DILATION, ESOPHAGUS;  Surgeon: Cindie Carlin POUR, DO;  Location: [facility];  Service: Endoscopy;  Laterality: N/A;   ESOPHAGOGASTRODUODENOSCOPY N/A 10/13/2015   Dr. Rivet: chronic gastritis on path, no H.pylori. Empiric dilation    ESOPHAGOGASTRODUODENOSCOPY N/A 12/18/2016   Dr. Harvey: mild gastritis. BRAVO study revealed uncontrolled GERD. Dysphagia secondary to uncontrolled reflux   ESOPHAGOGASTRODUODENOSCOPY N/A  12/03/2023   Procedure: EGD (ESOPHAGOGASTRODUODENOSCOPY);  Surgeon: Cindie Carlin POUR, DO;  Location: [facility];  Service: Endoscopy;  Laterality: N/A;   FOOT SURGERY Bilateral    nerve     INCISION AND DRAINAGE OF WOUND Left 10/30/2023   Procedure: IRRIGATION AND DEBRIDEMENT WOUND;   Surgeon: Arlinda Buster, MD;  Location: [facility] OR;  Service: Orthopedics;  Laterality: Left;  LEFT HAND WOUND   LEFT HEART CATH AND CORONARY ANGIOGRAPHY N/A 12/29/2018   Procedure: LEFT HEART CATH AND CORONARY ANGIOGRAPHY;  Surgeon: Dann Candyce RAMAN, MD;  Location: [facility] INVASIVE CV LAB;  Service: Cardiovascular;  Laterality: N/A;   LOWER EXTREMITY ANGIOGRAPHY Right 05/04/2022   Procedure: Lower Extremity Angiography;  Surgeon: Lanis Fonda BRAVO, MD;  Location: [facility] INVASIVE CV LAB;  Service: Cardiovascular;  Laterality: Right;   LUNG BIOPSY     MASS EXCISION Right 01/09/2013   Procedure: EXCISION OF NEOPLASM OF RIGHT  AXILLA  AND EXCISION OF NEOPLASM OF LEFT AXILLA;  Surgeon: Oneil DELENA Budge, MD;  Location: [facility] ORS;  Service: General;  Laterality: Right;  procedure end @ 08:23   MYRINGOTOMY WITH TUBE PLACEMENT Bilateral 04/28/2017   Procedure: BILATERAL MYRINGOTOMY WITH TUBE PLACEMENT;  Surgeon: Karis Clunes, MD;  Location: [facility] OR;  Service: ENT;  Laterality: Bilateral;   PERIPHERAL VASCULAR BALLOON ANGIOPLASTY Right 05/04/2022   Procedure: PERIPHERAL VASCULAR BALLOON ANGIOPLASTY;  Surgeon: Lanis Fonda BRAVO, MD;  Location: [facility] INVASIVE CV LAB;  Service: Cardiovascular;  Laterality: Right;  PT   PERIPHERAL VASCULAR INTERVENTION Right 05/04/2022   Procedure: PERIPHERAL VASCULAR INTERVENTION;  Surgeon: Lanis Fonda BRAVO, MD;  Location: [facility] INVASIVE CV LAB;  Service: Cardiovascular;  Laterality: Right;  SFA   PERIPHERAL VASCULAR INTERVENTION Left 07/21/2022   Procedure: PERIPHERAL VASCULAR INTERVENTION;  Surgeon: Serene Gaile ORN, MD;  Location: [facility];  Service: Cardiovascular;  Laterality: Left;   REVISION OF ARTERIOVENOUS GORETEX GRAFT Left 05/04/2018   Procedure: TRANSPOSITION OF CEPHALIC VEIN ARTERIOVENOUS FISTULA LEFT ARM;  Surgeon: Oris Krystal FALCON, MD;  Location: [facility] OR;  Service: Vascular;  Laterality: Left;   SAVORY DILATION N/A 12/18/2016   Procedure: SAVORY DILATION;  Surgeon: Harvey Margo CROME, MD;   [facility]: [facility];  Service: Endoscopy;  Laterality: N/A;   TRANSMETATARSAL AMPUTATION Left 08/15/2022   Procedure: TRANSMETATARSAL AMPUTATION;  Surgeon: Silva Juliene SAUNDERS, DPM;  Location: [facility] OR;  Service: Podiatry;  Laterality: Left;    Family History  Problem Relation Age of Onset   Hypertension Father    Hypercholesterolemia Father    Arthritis Father    Hypertension Sister    Hypercholesterolemia Sister    Breast cancer Sister    Hypertension Sister    Colon cancer Neg Hx    Colon polyps Neg Hx     Social History   Socioeconomic History   Marital status: Married    Spouse name: Venia Riveron   Number of children: 2   Years of education: 12   Highest education level: Some college, no degree  Occupational History   Occupation: retired   Tobacco Use   Smoking status: Never    Passive exposure: Never   Smokeless tobacco: Never   Tobacco comments:    Verified by Daughter, [name]  Vaping Use   Vaping status: Never Used  Substance and Sexual Activity   Alcohol use: No   Drug use: No   Sexual activity: Not Currently    Partners: Male  Other Topics Concern   Not on file  Social History  Narrative   Lives alone with husband    Right handed   Caffeine-1/2 daily   Social Drivers of Health   Financial Resource Strain: Low Risk  (11/24/2023)   Overall Financial Resource Strain (CARDIA)    Difficulty of Paying Living Expenses: Not hard at all  Food Insecurity: No Food Insecurity (12/16/2023)   Hunger Vital Sign    Worried About Running Out of Food in the Last Year: Never true    Ran Out of Food in the Last Year: Never true  Transportation Needs: No Transportation Needs (12/16/2023)   PRAPARE - Administrator, Civil Service (Medical): No    Lack of Transportation (Non-Medical): No  Physical Activity: Inactive (11/24/2023)   Exercise Vital Sign    Days of Exercise per Week: 0 days    Minutes of Exercise per Session: Not on file  Stress: Stress  Concern Present (11/24/2023)   [employer] of Occupational Health - Occupational Stress Questionnaire    Feeling of Stress: To some extent  Social Connections: Moderately Integrated (12/16/2023)   Social Connection and Isolation Panel    Frequency of Communication with Friends and Family: More than three times a week    Frequency of Social Gatherings with Friends and Family: More than three times a week    Attends Religious Services: 1 to 4 times per year    Active Member of [employer] or Organizations: No    Attends Banker Meetings: Never    Marital Status: Married  Recent Concern: Social Connections - Moderately Isolated (11/25/2023)   Social Connection and Isolation Panel    Frequency of Communication with Friends and Family: Three times a week    Frequency of Social Gatherings with Friends and Family: Three times a week    Attends Religious Services: Patient declined    Active Member of Clubs or Organizations: No    Attends Banker Meetings: Never    Marital Status: Married  Catering manager Violence: Not At Risk (12/16/2023)   Humiliation, Afraid, Rape, and Kick questionnaire    Fear of Current or Ex-Partner: No    Emotionally Abused: No    Physically Abused: No    Sexually Abused: No     Review of System:   General: Negative for anorexia, weight loss, fever, chills, fatigue, +weakness. Eyes: Negative for vision changes.  ENT: Negative for hoarseness, difficulty swallowing , nasal congestion. CV: Negative for chest pain, angina, palpitations, dyspnea on exertion, peripheral edema.  Respiratory: Negative for dyspnea at rest, dyspnea on exertion, cough, sputum, wheezing.  GI: See history of present illness. GU:  Negative for dysuria, hematuria, urinary incontinence, urinary frequency, nocturnal urination.  MS: Negative for joint pain, low back pain.  Derm: Negative for rash or itching.  Neuro: Negative for weakness, abnormal sensation, seizure, frequent  headaches, memory loss, confusion.  Psych: Negative for anxiety, depression, suicidal ideation, hallucinations.  Endo: Negative for unusual weight change.  Heme: Negative for bruising or bleeding. Allergy: Negative for rash or hives.      Physical Examination:   Vital signs in last 24 hours: Temp:  [97.5 F (36.4 C)-98.8 F (37.1 C)] 97.7 F (36.5 C) (09/25 0943) Pulse Rate:  [62-78] 77 (09/25 1200) Resp:  [9-18] 15 (09/25 1200) BP: (118-181)/(44-136) 177/58 (09/25 1200) SpO2:  [92 %-100 %] 100 % (09/25 1200) Weight:  [70.3 kg-72.7 kg] 70.3 kg (09/25 0943) Last BM Date : 12/16/23  General: Well-nourished, well-developed in no acute distress.  Head: Normocephalic, atraumatic.  Eyes: Conjunctiva pink, no icterus. Mouth: Oropharyngeal mucosa moist and pink , no lesions erythema or exudate. Neck: Supple without thyromegaly, masses, or lymphadenopathy.  Lungs: Clear to auscultation bilaterally.  Heart: Regular rate and rhythm, 3/6 SEM. no rubs or gallops.  Abdomen: Bowel sounds are normal, nontender, nondistended, no hepatosplenomegaly or masses, no abdominal bruits or hernia , no rebound or guarding.   Rectal: not performed Extremities: bilateral BKA.  Neuro: Alert and oriented x 4 , grossly normal neurologically.  Skin: Warm and dry, no rash or jaundice.   Psych: Alert and cooperative, normal mood and affect.        Intake/Output from previous day: 09/24 0701 - 09/25 0700 In: 315 [Blood:315] Out: -  Intake/Output this shift: Total I/O In: 480 [I.V.:3; Blood:477] Out: -   Lab Results:   CBC Recent Labs    12/15/23 2105  WBC 9.0  HGB 5.3*  HCT 18.0*  MCV 85.7  PLT 259   BMET Recent Labs    12/15/23 2105  NA 146*  K 4.1  CL 105  CO2 27  GLUCOSE 260*  BUN 39*  CREATININE 3.97*  CALCIUM  8.2*   LFT Recent Labs    12/15/23 2105  BILITOT 0.5  ALKPHOS 54  AST 13*  ALT <5  PROT 5.0*  ALBUMIN  2.4*    Lipase No results for input(s): LIPASE in the  last 72 hours.  PT/INR Recent Labs    12/15/23 2105  LABPROT 15.4*  INR 1.2     Hepatitis Panel No results for input(s): HEPBSAG, HCVAB, HEPAIGM, HEPBIGM in the last 72 hours.   Imaging Studies:   DG CHEST PORT 1 VIEW Result Date: 12/06/2023 CLINICAL DATA:  Leukocytosis EXAM: PORTABLE CHEST 1 VIEW COMPARISON:  Radiograph 11/10/2023 FINDINGS: Stable large cardiac silhouette. There is increased patchy airspace disease in the LEFT upper lobe and LEFT lower lobe. RIGHT lung clear. No pneumothorax. No acute osseous abnormality. IMPRESSION: New airspace patchy density in the LEFT lung. Findings concerning for multifocal pneumonia. Electronically Signed   By: Jackquline Boxer M.D.   On: 12/06/2023 13:52  [4 week]  Assessment:   68 y/o female with past medical history significant for GERD, pancreatitis, moderate aortic stenosis (ECHO 05/2023) recent new onset A-fib on apixaban  with cardioversion 10/27/2023, PAD (s/p R BKA in 05/2022, prior lithotripsy and stenting of the left superficial femoral and popliteal artery in 06/2022 and ultimately L BKA in 09/2022), HTN, HLD, Type 2 DM, bilateral adrenal adenoma (followed by Endocrinology), chronic HFpEF, OSA, ESRD (MWF), history of TIA's, history of recurrent chest pain  (cath in 12/2018 showing normal coronary arteries) recent hospitalization for left hand I&D, MSSA infection on antibiotics IV Ancef , last admission 9/11 with acute on chronic anemia with gi bleeding. Presenting now with persistent gi bleeding.  Acute on chronic anemia/rectal bleeding in the setting of Eliquis  -ongoing blood per rectum, did not stop after recent hospitalization -colonoscopy/EGD as outlined, suspected right sided diverticular bleed at that time with recommendations to obtain stat CTA GI bleed study and consult IR if active bleeding. -she reports she has been passing blood per rectum since her last admission. It never stopped, and daughter reports it has been  somewhat worse over the past several days -Hgb 7.9 on 9/16 and 5.2 on presentation  Differential includes ongoing stuttering diverticular bleeding, post-polypectomy bleeding, less likely simple hemorrhoidal bleeding with significant drops in her hgb.   Plan:   Follow up pending post-transfusion H/H. CTA GI Bleeding scan may be needed,  potentially low yield given slow bleeding. To discuss with Dr. Cinderella. She is scheduled for HD tomorrow.  Continue to hold Eliquis .    LOS: 0 days   We would like to thank you for the opportunity to participate in the care of Jamylah L Aicher.  Sonny RAMAN. Ezzard RIGGERS [facility] [facility] [phone] 9/25/202512:13 PM  Addendum: Discussed with Dr. Cinderella. Plan for CTA GI bleed scan due to significant drop in Hgb from 7 to 5. Ongoing rectal bleeding, somewhat slow/stuttering, may be low yield but given previous work up and significant drop, this is next indicated step. Inpatient nephrologist, Dr. Timothy Darden, agreed with approach and will plan to dialyze patient tomorrow. Spoke with daughter, Jorja, and patient previously about this option and they were agreeable, aware of need to dialyze tomorrow due to contrast.   Sonny RAMAN. Ezzard RIGGERS [facility] [phone] 9/25/20251:07 PM

## 2023-12-17 ENCOUNTER — Encounter (HOSPITAL_COMMUNITY): Payer: Self-pay | Admitting: Internal Medicine

## 2023-12-17 ENCOUNTER — Inpatient Hospital Stay (HOSPITAL_COMMUNITY): Admitting: Certified Registered"

## 2023-12-17 ENCOUNTER — Encounter (HOSPITAL_COMMUNITY)
Admission: EM | Disposition: A | Discharge status: Home / Self Care | Payer: Self-pay | Source: Home / Self Care | Attending: Internal Medicine

## 2023-12-17 DIAGNOSIS — D123 Benign neoplasm of transverse colon: Secondary | ICD-10-CM

## 2023-12-17 DIAGNOSIS — D125 Benign neoplasm of sigmoid colon: Secondary | ICD-10-CM

## 2023-12-17 DIAGNOSIS — K641 Second degree hemorrhoids: Secondary | ICD-10-CM

## 2023-12-17 DIAGNOSIS — K920 Hematemesis: Secondary | ICD-10-CM

## 2023-12-17 DIAGNOSIS — E1122 Type 2 diabetes mellitus with diabetic chronic kidney disease: Secondary | ICD-10-CM

## 2023-12-17 DIAGNOSIS — K5730 Diverticulosis of large intestine without perforation or abscess without bleeding: Secondary | ICD-10-CM

## 2023-12-17 DIAGNOSIS — N186 End stage renal disease: Secondary | ICD-10-CM

## 2023-12-17 DIAGNOSIS — I120 Hypertensive chronic kidney disease with stage 5 chronic kidney disease or end stage renal disease: Secondary | ICD-10-CM

## 2023-12-17 DIAGNOSIS — K6389 Other specified diseases of intestine: Secondary | ICD-10-CM

## 2023-12-17 DIAGNOSIS — K635 Polyp of colon: Secondary | ICD-10-CM

## 2023-12-17 DIAGNOSIS — K921 Melena: Secondary | ICD-10-CM

## 2023-12-17 DIAGNOSIS — K91840 Postprocedural hemorrhage and hematoma of a digestive system organ or structure following a digestive system procedure: Secondary | ICD-10-CM

## 2023-12-17 DIAGNOSIS — D128 Benign neoplasm of rectum: Secondary | ICD-10-CM

## 2023-12-17 DIAGNOSIS — K922 Gastrointestinal hemorrhage, unspecified: Secondary | ICD-10-CM | POA: Diagnosis not present

## 2023-12-17 DIAGNOSIS — D62 Acute posthemorrhagic anemia: Secondary | ICD-10-CM

## 2023-12-17 HISTORY — PX: COLONOSCOPY: SHX5424

## 2023-12-17 LAB — TYPE AND SCREEN
ABO/RH(D): B POS
Antibody Screen: NEGATIVE
Unit division: 0
Unit division: 0
Unit division: 0

## 2023-12-17 LAB — BPAM RBC
Blood Product Expiration Date: 202510182359
Blood Product Unit Number: 202510182359
Blood Product Unit Number: 202510202359
ISSUE DATE / TIME: 202509250635
PRODUCT CODE: 202509250331
PRODUCT CODE: 202510182359
PRODUCT CODE: 202510182359
Unit Type and Rh: 1700
Unit Type and Rh: 1700
Unit Type and Rh: 1700
Unit Type and Rh: 1700
Unit Type and Rh: 202509250022
Unit Type and Rh: 202510202359

## 2023-12-17 LAB — CBC
HCT: 27.4 % — ABNORMAL LOW (ref 36.0–46.0)
HCT: 26.1 % — ABNORMAL LOW (ref 36.0–46.0)
Hemoglobin: 8.9 g/dL — ABNORMAL LOW (ref 12.0–15.0)
Hemoglobin: 8.4 g/dL — ABNORMAL LOW (ref 12.0–15.0)
MCH: 27.8 pg (ref 26.0–34.0)
MCH: 27.4 pg (ref 26.0–34.0)
MCHC: 32.5 g/dL (ref 30.0–36.0)
MCHC: 32.2 g/dL (ref 30.0–36.0)
MCV: 85.6 fL (ref 80.0–100.0)
MCV: 85 fL (ref 80.0–100.0)
Platelets: 269 K/uL (ref 150–400)
Platelets: 286 K/uL (ref 150–400)
RBC: 3.2 MIL/uL — ABNORMAL LOW (ref 3.87–5.11)
RBC: 3.07 MIL/uL — ABNORMAL LOW (ref 3.87–5.11)
RDW: 16.6 % — ABNORMAL HIGH (ref 11.5–15.5)
RDW: 16.6 % — ABNORMAL HIGH (ref 11.5–15.5)
WBC: 13.5 K/uL — ABNORMAL HIGH (ref 4.0–10.5)
WBC: 13.2 K/uL — ABNORMAL HIGH (ref 4.0–10.5)
nRBC: 0.1 % (ref 0.0–0.2)
nRBC: 0.2 % (ref 0.0–0.2)

## 2023-12-17 LAB — COMPREHENSIVE METABOLIC PANEL WITH GFR
ALT: <5 U/L (ref 0–44)
AST: 11 U/L — ABNORMAL LOW (ref 15–41)
Albumin: 2.4 g/dL — ABNORMAL LOW (ref 3.5–5.0)
Alkaline Phosphatase: 51 U/L (ref 38–126)
Anion gap: 12 (ref 5–15)
BUN: 52 mg/dL — ABNORMAL HIGH (ref 8–23)
CO2: 24 mmol/L (ref 22–32)
Calcium: 7.5 mg/dL — ABNORMAL LOW (ref 8.9–10.3)
Chloride: 106 mmol/L (ref 98–111)
Creatinine, Ser: 4.99 mg/dL — ABNORMAL HIGH (ref 0.44–1.00)
GFR, Estimated: 9 mL/min — ABNORMAL LOW (ref >60)
Glucose, Bld: 82 mg/dL (ref 70–99)
Potassium: 5.5 mmol/L — ABNORMAL HIGH (ref 3.5–5.1)
Sodium: 142 mmol/L (ref 135–145)
Total Bilirubin: 0.7 mg/dL (ref 0.0–1.2)
Total Protein: 4.9 g/dL — ABNORMAL LOW (ref 6.5–8.1)

## 2023-12-17 LAB — GLUCOSE, CAPILLARY
Glucose-Capillary: 77 mg/dL (ref 70–99)
Glucose-Capillary: 74 mg/dL (ref 70–99)
Glucose-Capillary: 55 mg/dL — ABNORMAL LOW (ref 70–99)
Glucose-Capillary: 128 mg/dL — ABNORMAL HIGH (ref 70–99)
Glucose-Capillary: 58 mg/dL — ABNORMAL LOW (ref 70–99)
Glucose-Capillary: 145 mg/dL — ABNORMAL HIGH (ref 70–99)

## 2023-12-17 LAB — RENAL FUNCTION PANEL
Albumin: 2.4 g/dL — ABNORMAL LOW (ref 3.5–5.0)
Anion gap: 16 — ABNORMAL HIGH (ref 5–15)
BUN: 54 mg/dL — ABNORMAL HIGH (ref 8–23)
CO2: 23 mmol/L (ref 22–32)
Calcium: 7.6 mg/dL — ABNORMAL LOW (ref 8.9–10.3)
Chloride: 101 mmol/L (ref 98–111)
Creatinine, Ser: 5.39 mg/dL — ABNORMAL HIGH (ref 0.44–1.00)
GFR, Estimated: 8 mL/min — ABNORMAL LOW (ref >60)
Glucose, Bld: 66 mg/dL — ABNORMAL LOW (ref 70–99)
Phosphorus: 5.6 mg/dL — ABNORMAL HIGH (ref 2.5–4.6)
Potassium: 5.5 mmol/L — ABNORMAL HIGH (ref 3.5–5.1)
Sodium: 140 mmol/L (ref 135–145)

## 2023-12-17 LAB — HEMOGLOBIN AND HEMATOCRIT, BLOOD
HCT: 26.2 % — ABNORMAL LOW (ref 36.0–46.0)
Hemoglobin: 8.6 g/dL — ABNORMAL LOW (ref 12.0–15.0)

## 2023-12-17 LAB — MAGNESIUM: Magnesium: 1.8 mg/dL (ref 1.7–2.4)

## 2023-12-17 LAB — HEPATITIS B SURFACE ANTIBODY, QUANTITATIVE: Hep B S AB Quant (Post): 142 m[IU]/mL

## 2023-12-17 SURGERY — COLONOSCOPY
Anesthesia: General

## 2023-12-17 MED ORDER — LIDOCAINE HCL (PF) 1 % IJ SOLN: 5.0000 mL | INTRAMUSCULAR | Status: DC | PRN

## 2023-12-17 MED ORDER — HEPARIN SODIUM (PORCINE) 1000 UNIT/ML DIALYSIS: 1000.0000 [IU] | INTRAMUSCULAR | Status: DC | PRN

## 2023-12-17 MED ORDER — LACTATED RINGERS IV SOLN: INTRAVENOUS | Status: DC | PRN

## 2023-12-17 MED ORDER — GLUCAGON HCL RDNA (DIAGNOSTIC) 1 MG IJ SOLR
INTRAMUSCULAR | Status: DC | PRN
  Administered 2023-12-17: 1 mg via INTRAVENOUS

## 2023-12-17 MED ORDER — LIDOCAINE-PRILOCAINE 2.5-2.5 % EX CREA: 1.0000 | TOPICAL_CREAM | CUTANEOUS | Status: DC | PRN

## 2023-12-17 MED ORDER — DEXTROSE 50 % IV SOLN: 12.5000 g | INTRAVENOUS | Status: AC

## 2023-12-17 MED ORDER — PROPOFOL 10 MG/ML IV BOLUS
INTRAVENOUS | Status: DC | PRN
  Administered 2023-12-17: 40 mg via INTRAVENOUS

## 2023-12-17 MED ORDER — PROPOFOL 500 MG/50ML IV EMUL
INTRAVENOUS | Status: DC | PRN
  Administered 2023-12-17: 100 ug/kg/min via INTRAVENOUS

## 2023-12-17 MED ORDER — DEXTROSE 50 % IV SOLN
INTRAVENOUS | Status: AC
  Filled 2023-12-17: qty 50

## 2023-12-17 MED ORDER — DEXTROSE 50 % IV SOLN
12.5000 g | INTRAVENOUS | Status: AC
  Administered 2023-12-17: 12.5 g via INTRAVENOUS

## 2023-12-17 MED ORDER — ALTEPLASE 2 MG IJ SOLR: 2.0000 mg | Freq: Once | INTRAMUSCULAR | Status: DC | PRN

## 2023-12-17 MED ORDER — PENTAFLUOROPROP-TETRAFLUOROETH EX AERO
1.0000 | INHALATION_SPRAY | CUTANEOUS | Status: DC | PRN
  Administered 2023-12-17: 1 via TOPICAL
  Filled 2023-12-17: qty 30

## 2023-12-17 MED ORDER — DEXTROSE 50 % IV SOLN
INTRAVENOUS | Status: AC
  Administered 2023-12-17: 12.5 g via INTRAVENOUS
  Filled 2023-12-17: qty 50

## 2023-12-17 NOTE — Procedures (Signed)
 Received patient in bed Alert and oriented.  Informed consent signed and in chart.  LUA AVF cannulated x 2 with 15g needles per policy, without difficulty. Labs drawn, labeled and given to primary RN Tx initiated per MD order. UF goal 1300 ml. 1025 Dr Windle at  bedside  [facility] duration:4 hours Tx complete. Blood returned. Needles removed, sites held x 2 until hemostasis achieved, gauze changed prior to taping. VSS.  Patient tolerated well.  Transported back to the room  Alert, without acute distress.  Hand-off given to patient's nurse.   Access used: LUA AVF Access issues: none  Total UF removed: 1300 Medication(s) given: See MAR   [facility]

## 2023-12-17 NOTE — Progress Notes (Signed)
 Admit: 12/15/2023 LOS: 1   35F PMH ESKD on HD MWF, hx of Atrial fib on apixaban , PAD  (s/p R BKA in 05/2022, prior lithotripsy and stenting of the left superficial femoral and popliteal artery in 06/2022 and ultimately L BKA in 09/2022), HTN, HLD, Type 2 DM, bilateral adrenal adenoma (followed by Endocrinology), chronic HFpEF, aortic stenosis, OSA, history of TIA's, history of recurrent chest pain (cath in 12/2018 showing normal coronary arteries) recent hospitalization for left hand I&D, MSSA infection on antibiotics IV Ancef  with HD. Presents 9/24 for rectal bleeding.  Recently hosptialized for symptomatic anemia and underwent colonoscopy, removing 2 polyps. Restarted AC.   Hgb 5.3.  Started IV protonix .  Received pRBC x3.  Held [facility].   Subjective:  No major events reported overnight.  Seen this morning in ICU on [facility].  Overall doing well.  Denies any additional bleeding.  Currently undergoing bowel prep with possible colonoscopy later today.  09/25 0701 - 09/26 0700 In: 1680 [P.O.:1200; I.V.:3; Blood:477] Out: -   Filed Weights   12/15/23 2038 12/16/23 0943 12/17/23 0525  Weight: 72.7 kg 70.3 kg 71.8 kg    Scheduled Meds:  sodium chloride    Intravenous Once   busPIRone   7.5 mg Oral BID   calcitRIOL   1 mcg Oral Q M,W,F-HD   Chlorhexidine  Gluconate Cloth  6 each Topical Q0600   Chlorhexidine  Gluconate Cloth  6 each Topical Q0600   cinacalcet   90 mg Oral Q M,W,F   ezetimibe   10 mg Oral Daily   insulin  aspart  0-5 Units Subcutaneous QHS   insulin  aspart  0-6 Units Subcutaneous TID WC   metoprolol  succinate  50 mg Oral Q T,Th,S,Su   pantoprazole  (PROTONIX ) IV  40 mg Intravenous Q12H   sertraline   75 mg Oral Daily   silver  sulfADIAZINE    Topical Daily   sodium chloride  flush  3 mL Intravenous Q12H   sucroferric oxyhydroxide  1,000 mg Oral TID with meals   sucroferric oxyhydroxide  500 mg Oral With snacks   torsemide   100 mg Oral Q T,Th,S,Su   Continuous Infusions:  sodium chloride        ceFAZolin  (ANCEF ) IV     PRN Meds:.alteplase , heparin , lidocaine  (PF), lidocaine -prilocaine , ondansetron  (ZOFRAN ) IV, pentafluoroprop-tetrafluoroeth  Current Labs: reviewed K 5.5, Ca 7.5, Hgb 8.9  Outpt HD Orders Dialyzes at [employer] MWF, EDW 70.5kg. HD Bath 2 k, 2.5 Ca, Dialyzer Nipro 17H, Heparin  Hold 2/2 GIB. Access L AVF. BFR 400 DFR 500. IV Cefazolin  2g 10/20   Physical Exam:  Blood pressure (!) 176/63, pulse 61, temperature 98.2 F (36.8 C), temperature source Oral, resp. rate 14, height 5' 4 (1.626 m), weight 71.8 kg, SpO2 100%. GEN: wdwn, sitting in bed, nad ENT: no nasal discharge, mmm EYES: no scleral icterus, eomi CV: normal rate, no murmurs PULM: no iwob, bilateral chest rise ABD: NABS, non-distended SKIN: no rashes or jaundice EXT: no edema, warm and well perfused  Assessment/Plan Rectal Bleed: Recent colonoscopy.  Rec'd pRBC.  GI consulted; possible colonoscopy today.  On PPI.   ESRD: MWF.  Plan routine HD today. Hypertension: Plan routine HD to help with volume.  BP per primary: On Toprol  50 daily, torsemide  100 on non-HD days. Anemia of ESRD: s/p pRBC as above.  Metabolic Bone Disease: On home [name] , calcitriol , sensipar .  Continue.  SSTI v osteomyelitis of hand: On IV Cefazolin  2g 3x/week after HD.  EOT 01/10/2024. Hypernatremia: Resolved  Right kidney with 1.3 cm exophytic enhancing lesion seen on CTA.  C/f RCC.  Defer to urology.    Dr. Evalene Lanes 12/17/2023, 9:43 AM  Recent Labs  Lab 12/15/23 2105 12/16/23 1247 12/17/23 0415  NA 146* 145 142  K 4.1 5.1 5.5*  CL 105 105 106  CO2 27 24 24   GLUCOSE 260* 108* 82  BUN 39* 47* 52*  CREATININE 3.97* 4.78* 4.99*  CALCIUM  8.2* 7.8* 7.5*  PHOS  --  5.3*  --    Recent Labs  Lab 12/15/23 2105 12/16/23 1247 12/16/23 2040 12/17/23 0415  WBC 9.0 12.4*  --  13.5*  HGB 5.3* 9.2* 9.2* 8.9*  HCT 18.0* 27.9* 27.8* 27.4*  MCV 85.7 84.8  --  85.6  PLT 259 235  --  269

## 2023-12-17 NOTE — Transfer of Care (Signed)
 Immediate Anesthesia Transfer of Care Note  Patient: LAMEEKA SCHLEIFER  Procedure(s) Performed: COLONOSCOPY  Patient Location: PACU  Anesthesia Type:General  Level of Consciousness: awake, drowsy, and patient cooperative  Airway & Oxygen  Therapy: Patient Spontanous Breathing and Patient connected to nasal cannula oxygen   Post-op Assessment: Report given to RN, Post -op Vital signs reviewed and stable, and Patient moving all extremities X 4  Post vital signs: Reviewed and stable  Last Vitals:  Vitals Value Taken Time  BP 148/66 12/17/23 15:57  Temp    Pulse 85 1558  Resp 17 12/17/23 15:58  SpO2 100 1558  Vitals shown include unfiled device data.  Last Pain:  Vitals:   12/17/23 1447  TempSrc: Oral  PainSc: 0-No pain         Complications: No notable events documented.

## 2023-12-17 NOTE — Interval H&P Note (Signed)
 History and Physical Interval Note:  12/17/2023 2:39 PM  Kathryn Beck  has presented today for surgery, with the diagnosis of hematochezia , acute post hemorrhagic anemia.  The various methods of treatment have been discussed with the patient and family. After consideration of risks, benefits and other options for treatment, the patient has consented to  Procedure(s): COLONOSCOPY (N/A) as a surgical intervention.  The patient's history has been reviewed, patient examined, no change in status, stable for surgery.  I have reviewed the patient's chart and labs.  Questions were answered to the patient's satisfaction.     Lamar Hollingshead  Some additional blood per rectum with prep last night remains hemodynamically stable dialysis done today.  Hemoglobin this morning 8.4 ileocolonoscopy plan to further evaluate as to the cause of bleeding post polypectomy bleed remains in the differential Eliquis  is washed out risk benefits and limitations have been discussed with patient ; she is agreeable.

## 2023-12-17 NOTE — Op Note (Signed)
 [facility] Patient Name: Kathryn Beck Procedure Date: 12/17/2023 2:00 PM MRN: [id number] Date of Birth: [birth year] Attending MD: Lamar Ozell Hollingshead , MD, [id number] CSN: [id number] Age: 68 Admit Type: Outpatient Procedure:                Colonoscopy Indications:              Hematochezia Providers:                Lamar Ozell Hollingshead, MD, Devere Lodge, Bascom Blush Referring MD:              Medicines:                Propofol  per Anesthesia Complications:            No immediate complications. Estimated Blood Loss:     Estimated blood loss was minimal. Estimated blood                            loss was minimal. Procedure:                Pre-Anesthesia Assessment:                           - Prior to the procedure, a History and Physical                            was performed, and patient medications and                            allergies were reviewed. The patient's tolerance of                            previous anesthesia was also reviewed. The risks                            and benefits of the procedure and the sedation                            options and risks were discussed with the patient.                            All questions were answered, and informed consent                            was obtained. Prior Anticoagulants: The patient                            last took Eliquis  (apixaban ) 2 days prior to the                            procedure. ASA Grade Assessment: III - A patient                            with severe systemic disease. After reviewing the  risks and benefits, the patient was deemed in                            satisfactory condition to undergo the procedure.                           After obtaining informed consent, the colonoscope                            was passed under direct vision. Throughout the                            procedure, the patient's blood pressure, pulse, and                             oxygen  saturations were monitored continuously. The                            [id number]-[id number] ([id number]) Colon was introduced through                            the anus and advanced to the 15 cm into the ileum.                            The colonoscopy was performed without difficulty.                            The patient tolerated the procedure well. The                            quality of the bowel preparation was adequate. The                            terminal ileum, ileocecal valve, appendiceal                            orifice, and rectum were photographed. Scope In: 3:15:20 PM Scope Out: 3:50:36 PM Scope Withdrawal Time: 0 hours 26 minutes 41 seconds  Total Procedure Duration: 0 hours 35 minutes 16 seconds  Findings:      A diffuse area of moderate melanosis was found in the rectum. Internal       hemorrhoids present grade 2. Nonbleeding. Diluted slightly blood-tinged       effluent in the ascending segment. Ascending cold snare polypectomy site       identified with activity oozing depicted on serial photographs. This       bleeder was closed with (1) 360 clip.      Six sessile polyps were found in the rectum, sigmoid colon, descending       colon and hepatic flexure. The polyps were 4 to 5 mm in size. These       polyps were removed with a hot snare. Resection and retrieval were       complete. Estimated blood loss: none.      Scattered medium-mouthed diverticula were found in the entire colon.  Distal 15 cm of terminal ileum appeared normal.      Non-bleeding external and internal hemorrhoids were found during       retroflexion. The hemorrhoids were moderate, medium-sized and Grade II       (internal hemorrhoids that prolapse but reduce spontaneously). Impression:               - Post polypectomy bleeding (ascending polypectomy                            site) closed with hemostasis clip x 1                           - Melanosis coli.                           - Six 4  to 5 mm polyps in the rectum, in the                            sigmoid colon, in the descending colon and at the                            hepatic flexure, removed with a hot snare. Resected                            and retrieved.                           - Diverticulosis in the entire examined colon.                           - Non-bleeding external and internal hemorrhoids. Moderate Sedation:      Moderate (conscious) sedation was personally administered by an       anesthesia professional. The following parameters were monitored: oxygen        saturation, heart rate, blood pressure, respiratory rate, EKG, adequacy       of pulmonary ventilation, and response to care. Recommendation:           -.                           - Advance diet as tolerated. Follow-up on                            pathology. May resume Eliquis  on October 1.                           - Return patient to ICU for observation.                           - Advance diet as tolerated. Recheck hemoglobin                            tomorrow morning. At patient request, I called  [name], daughter, [phone]. I updated                            her on my findings and recommendations. Her                            questions were answered. Procedure Code(s):        --- Professional ---                           [phone], Colonoscopy, flexible; with removal of                            tumor(s), polyp(s), or other lesion(s) by snare                            technique Diagnosis Code(s):        --- Professional ---                           K63.89, Other specified diseases of intestine                           D12.8, Benign neoplasm of rectum                           D12.5, Benign neoplasm of sigmoid colon                           D12.4, Benign neoplasm of descending colon                           D12.3, Benign neoplasm of transverse colon (hepatic                             flexure or splenic flexure)                           K64.1, Second degree hemorrhoids                           K92.1, Melena (includes Hematochezia)                           K57.30, Diverticulosis of large intestine without                            perforation or abscess without bleeding CPT copyright 2022 [employer]. All rights reserved. The codes documented in this report are preliminary and upon coder review may  be revised to meet current compliance requirements. Lamar HERO. Kyleah Pensabene, MD Lamar Ozell Hollingshead, MD 12/17/2023 4:12:14 PM This report has been signed electronically. Number of Addenda: 0

## 2023-12-17 NOTE — Anesthesia Postprocedure Evaluation (Signed)
 Anesthesia Post Note  Patient: Kathryn Beck  Procedure(s) Performed: COLONOSCOPY  Patient location during evaluation: Phase II Anesthesia Type: General Level of consciousness: awake Pain management: pain level controlled Vital Signs Assessment: post-procedure vital signs reviewed and stable Respiratory status: spontaneous breathing and respiratory function stable Cardiovascular status: blood pressure returned to baseline and stable Postop Assessment: no headache and no apparent nausea or vomiting Anesthetic complications: no Comments: Late entry   No notable events documented.   Last Vitals:  Vitals:   12/17/23 1447 12/17/23 1558  BP: 139/61   Pulse: 72   Resp: 18   Temp: 36.7 C 36.4 C  SpO2: 100% 100%    Last Pain:  Vitals:   12/17/23 1558  TempSrc:   PainSc: 0-No pain                 [name]

## 2023-12-17 NOTE — Progress Notes (Signed)
 PROGRESS NOTE    Kathryn Beck  FMW:[id number] DOB: May 27, 1955 DOA: 12/15/2023 PCP: Antonetta Rollene BRAVO, MD   Brief Narrative:    Kathryn Beck is a 68 y.o. female with hx of Atrial fib on apixaban , PAD  (s/p R BKA in 05/2022, prior lithotripsy and stenting of the left superficial femoral and popliteal artery in 06/2022 and ultimately L BKA in 09/2022), HTN, HLD, Type 2 DM, bilateral adrenal adenoma (followed by Endocrinology), chronic HFpEF, aortic stenosis, OSA, ESRD (MWF), history of TIA's, history of recurrent chest pain  (cath in 12/2018 showing normal coronary arteries) recent hospitalization for left hand I&D, MSSA infection on antibiotics IV Ancef  with HD, who was recently hospitalized 9/10 - 9/16 for symptomatic anemia and possible GI bleeding and underwent colonoscopy demonstrating diverticulosis and 2 polyps removed, with likely diverticular bleed as source, restarted on [facility] 9/15. Returns with rectal bleeding.  Patient was admitted for severe symptomatic acute blood loss anemia and 3 units of PRBCs have been ordered for transfusion with plans for GI to further evaluate with potential colonoscopy today.  Nephrology also consulted for hemodialysis which is planned for today.  Assessment & Plan:   Principal Problem:   Lower GI bleed Active Problems:   Severe anemia  Assessment and Plan:   Lower GI bleeding  Severe symptomatic anemia, with component of acute blood loss  P/w continued dark red rectal bleeding, possible melena as well. Gross hematochezia noted in the ED. Hemodynamically stable. Hemoglobin 5.3 (down from 7.9 on 9/16). Recent hx EGD 9/12 with normal esophagus and duodenum, medium amount of food residue in stomach although no active or stigmata of bleeding noted. colonoscopy on 9/14 with nonbleeding internal hemorrhoids, diverticulosis, 2 polyps which were removed, suspected diverticular bleed last admission.  - Completed 3 unit PRBC transfusion with repeat hemoglobin  pending -Hold apixaban  and likely favor lowering dose vs discontinuation on discharge -Planning for potential endoscopy today   Paroxysmal Atrial fib on apixaban  Hx new onset A-fib in 8/'25, required DCCV in the ED, echo with grade 1 diastolic dysfunction, severe dilation of left atria, moderate AS. CHA2DS2-VASc of 7; was started on anticoagulation that admission, and continued on aspirin  for hx PAD and stents. Plavix  was discontinued.  -- See above, recurrent bleeding for now [facility] and antiPLT on hold.  -- Continue home Metoprolol  50 mg QIW on nonHD days  -- favor discontinuation of anticoagulation at discharge + continuation of aspirin  with f/u as outpatient to consider restarting. May consider for watchman if she is a candidate for this procedure.    ESRD on MWF HD  with hyperkalemia -- Nephrology plans for hemodialysis today - Continue home torsemide  on non-HD days, home [name] , calcitriol , sensipar     Recent hx of dry gangrene L hand  Possible SSTI v underlying osteomyelitis  -- Follows with ID, orthopedics outpatient; may require amputation per OP review  -- Continue Cefazolin  2 g IV three times per week after HD.  -- Will continue wound care, photograph L hand wound  - EOT 01/10/2024   Chronic medical problems:  PAD s/p R BKA in 05/2022, prior lithotripsy and stenting of the left superficial femoral and popliteal artery in 06/2022 and ultimately L BKA in 09/2022: Hold antiPLT in setting of bleed, resume aspirin  as soon as able.  HTN: Hold amlodipine  in setting of bleed.  HLD: Continue home zetia   Type 2 DM: Continue home glargine 30 units daily, add SSI for very sensitive, at bedtime correction. bilateral adrenal adenoma: followed by  Endocrinology, outpatient follow-up chronic HFpEF: Without acute exacerbation, continue home torsemide  on on HD days per above aortic stenosis: History of moderate AS, echo surveillance outpatient OSA: not currently using CPAP   History of TIA's: See  antiplatelet and anticoagulant management per above history of recurrent chest pain: cath in 12/2018 showing normal coronary arteries Mood d/o: Continue home sertraline     DVT prophylaxis:SCDs Code Status: Full Family Communication: Husband at bedside 9/26 Disposition Plan:  Status is: Inpatient Remains inpatient appropriate because: Need for IV medications and inpatient evaluation.  Consultants:  GI Nephrology  Procedures:  None  Antimicrobials:  Anti-infectives (From admission, onward)    Start     Dose/Rate Route Frequency Ordered Stop   12/17/23 1400  ceFAZolin  (ANCEF ) IVPB 2g/100 mL premix  Status:  Discontinued       Note to Pharmacy: Give after HD on HD days   2 g 200 mL/hr over 30 Minutes Intravenous Once per day on Monday Wednesday Friday 12/16/23 0110 12/16/23 1131   12/17/23 1200  ceFAZolin  (ANCEF ) IVPB 2g/100 mL premix       Note to Pharmacy: Cont OP abx   2 g 200 mL/hr over 30 Minutes Intravenous Every M-W-F (Hemodialysis) 12/16/23 1131         Subjective: Patient seen and evaluated today with no new acute complaints or concerns. No acute concerns or events noted overnight.  She denies any further bleeding episodes and has been taking colon prep for potential colonoscopy today.  Objective: Vitals:   12/17/23 1115 12/17/23 1130 12/17/23 1145 12/17/23 1200  BP: (!) 178/68 (!) 174/63 (!) 164/66 (!) 173/73  Pulse: 65 (!) 57 62 65  Resp: 13 13 13 11   Temp:      TempSrc:      SpO2: 100% 99% 100% 100%  Weight:      Height:        Intake/Output Summary (Last 24 hours) at 12/17/2023 1208 Last data filed at 12/16/2023 2204 Gross per 24 hour  Intake 1200 ml  Output --  Net 1200 ml   Filed Weights   12/16/23 0943 12/17/23 0525 12/17/23 0945  Weight: 70.3 kg 71.8 kg 71.8 kg    Examination:  General exam: Appears calm and comfortable  Respiratory system: Clear to auscultation. Respiratory effort normal. Cardiovascular system: S1 & S2 heard, RRR.   Gastrointestinal system: Abdomen is soft Central nervous system: Alert and awake Extremities: No edema Skin: No significant lesions noted Psychiatry: Flat affect.    Data Reviewed: I have personally reviewed following labs and imaging studies  CBC: Recent Labs  Lab 12/15/23 2105 12/16/23 1247 12/16/23 2040 12/17/23 0415 12/17/23 0900 12/17/23 1000  WBC 9.0 12.4*  --  13.5* 13.2*  --   HGB 5.3* 9.2* 9.2* 8.9* 8.4* 8.6*  HCT 18.0* 27.9* 27.8* 27.4* 26.1* 26.2*  MCV 85.7 84.8  --  85.6 85.0  --   PLT 259 235  --  269 286  --    Basic Metabolic Panel: Recent Labs  Lab 12/15/23 2105 12/16/23 1247 12/17/23 0415 12/17/23 0900  NA 146* 145 142 140  K 4.1 5.1 5.5* 5.5*  CL 105 105 106 101  CO2 27 24 24 23   GLUCOSE 260* 108* 82 66*  BUN 39* 47* 52* 54*  CREATININE 3.97* 4.78* 4.99* 5.39*  CALCIUM  8.2* 7.8* 7.5* 7.6*  MG  --  1.9 1.8  --   PHOS  --  5.3*  --  5.6*   GFR: Estimated Creatinine Clearance: 9.8  mL/min (A) (by C-G formula based on SCr of 5.39 mg/dL (H)). Liver Function Tests: Recent Labs  Lab 12/15/23 2105 12/17/23 0415 12/17/23 0900  AST 13* 11*  --   ALT <5 <5  --   ALKPHOS 54 51  --   BILITOT 0.5 0.7  --   PROT 5.0* 4.9*  --   ALBUMIN  2.4* 2.4* 2.4*   No results for input(s): LIPASE, AMYLASE in the last 168 hours. No results for input(s): AMMONIA in the last 168 hours. Coagulation Profile: Recent Labs  Lab 12/15/23 2105  INR 1.2   Cardiac Enzymes: No results for input(s): CKTOTAL, CKMB, CKMBINDEX, TROPONINI in the last 168 hours. BNP (last 3 results) No results for input(s): PROBNP in the last 8760 hours. HbA1C: No results for input(s): HGBA1C in the last 72 hours. CBG: Recent Labs  Lab 12/16/23 1133 12/16/23 1614 12/16/23 2144 12/17/23 0750 12/17/23 1131  GLUCAP 109* 91 109* 77 74   Lipid Profile: No results for input(s): CHOL, HDL, LDLCALC, TRIG, CHOLHDL, LDLDIRECT in the last 72 hours. Thyroid   Function Tests: No results for input(s): TSH, T4TOTAL, FREET4, T3FREE, THYROIDAB in the last 72 hours. Anemia Panel: No results for input(s): VITAMINB12, FOLATE, FERRITIN, TIBC, IRON, RETICCTPCT in the last 72 hours. Sepsis Labs: Recent Labs  Lab 12/15/23 2218  LATICACIDVEN 1.9    Recent Results (from the past 240 hours)  MRSA Next Gen by PCR, Nasal     Status: None   Collection Time: 12/16/23  9:14 AM   Specimen: Nasal Mucosa; Nasal Swab  Result Value Ref Range Status   MRSA by PCR Next Gen NOT DETECTED NOT DETECTED Final    Comment: (NOTE) The GeneXpert MRSA Assay (FDA approved for NASAL specimens only), is one component of a comprehensive MRSA colonization surveillance program. It is not intended to diagnose MRSA infection nor to guide or monitor treatment for MRSA infections. Test performance is not FDA approved in patients less than 61 years old. Performed at [facility], [address] [place], [place] [address]          Radiology Studies: CT ANGIO GI BLEED Result Date: 12/16/2023 CLINICAL DATA:  Persistent GI bleed status post colonoscopy with polypectomy EXAM: CTA ABDOMEN AND PELVIS WITHOUT AND WITH CONTRAST TECHNIQUE: Multidetector CT imaging of the abdomen and pelvis was performed using the standard protocol during bolus administration of intravenous contrast. Multiplanar reconstructed images and MIPs were obtained and reviewed to evaluate the vascular anatomy. RADIATION DOSE REDUCTION: This exam was performed according to the departmental dose-optimization program which includes automated exposure control, adjustment of the mA and/or kV according to patient size and/or use of iterative reconstruction technique. CONTRAST:  OMNIPAQUE  IOHEXOL  350 MG/ML SOLN COMPARISON:  CT abdomen and pelvis dated 03/24/2017 FINDINGS: VASCULAR Aorta: Normal caliber aorta and branch vessels without aneurysm, dissection, vasculitis or significant stenosis. No active  extravasation. Aortic atherosclerosis. Conventional celiac branching pattern. Single bilateral renal arteries demonstrating mild luminal narrowing of the origins due to atherosclerotic plaque. Mild to moderate luminal narrowing of the internal iliac arteries bilaterally due to atherosclerotic plaque. Proximal Outflow: Segmental mild luminal narrowing of bilateral superficial femoral arteries due to atherosclerotic plaque. No aneurysm. Veins: No obvious venous abnormality within the limitations of this arterial phase study. Review of the MIP images confirms the above findings. NON-VASCULAR Lower chest: Unchanged left lower lobe scarring and pleural thickening. No pneumothorax. Mild multichamber cardiomegaly. Coronary artery calcifications. Hepatobiliary: Subcentimeter hypodensity in the caudate lobe (15:22), too small to characterize. No  intra or extrahepatic biliary ductal dilation. Normal gallbladder. Pancreas: No focal lesions or main ductal dilation. Spleen: Normal in size without focal abnormality. Adrenals/Urinary Tract: Unchanged bilateral adrenal nodules measuring 1.7 cm, 29 HU on the right and 1.9 cm, 20 HU on the left, likely adenomas. No specific follow-up imaging recommended. No hydronephrosis or renal calculi. Bilateral renal cysts, many of which are too small to characterize. A 1.3 cm exophytic enhancing lesion arising from the posteromedial aspect of the lower pole right kidney (15:48) is new from 2019. No focal bladder wall thickening. Stomach/Bowel: Normal appearance of the stomach. No evidence of bowel wall thickening, distention, or inflammatory changes. Colonic diverticulosis without acute diverticulitis. Appendix is not discretely seen. Lymphatic: No enlarged abdominal or pelvic lymph nodes. Reproductive: No adnexal masses. Other: No free fluid, fluid collection, or free air. Sequela of fat necrosis in the midline anterior peritoneum (4:51). Musculoskeletal: No acute or abnormal lytic or blastic  osseous lesions. Postsurgical changes of the anterior abdominal wall. Metallic clips adjacent to the right inguinal region and in the lower quadrant. Multilevel degenerative changes of the partially imaged thoracic and lumbar spine. IMPRESSION: 1. No active extravasation. 2. A 1.3 cm exophytic enhancing lesion arising from the posteromedial aspect of the lower pole right kidney is new from 2019 and suspicious for renal cell carcinoma. Recommend consultation to urology. 3. Colonic diverticulosis without acute diverticulitis. 4. Aortic Atherosclerosis (ICD10-I70.0). Coronary artery calcifications. Assessment for potential risk factor modification, dietary therapy or pharmacologic therapy may be warranted, if clinically indicated. Electronically Signed   By: Limin  Xu M.D.   On: 12/16/2023 14:54        Scheduled Meds:  sodium chloride    Intravenous Once   busPIRone   7.5 mg Oral BID   calcitRIOL   1 mcg Oral Q M,W,F-HD   Chlorhexidine  Gluconate Cloth  6 each Topical Q0600   Chlorhexidine  Gluconate Cloth  6 each Topical Q0600   cinacalcet   90 mg Oral Q M,W,F   ezetimibe   10 mg Oral Daily   insulin  aspart  0-5 Units Subcutaneous QHS   insulin  aspart  0-6 Units Subcutaneous TID WC   metoprolol  succinate  50 mg Oral Q T,Th,S,Su   pantoprazole  (PROTONIX ) IV  40 mg Intravenous Q12H   sertraline   75 mg Oral Daily   silver  sulfADIAZINE    Topical Daily   sodium chloride  flush  3 mL Intravenous Q12H   sucroferric oxyhydroxide  1,000 mg Oral TID with meals   sucroferric oxyhydroxide  500 mg Oral With snacks   torsemide   100 mg Oral Q T,Th,S,Su   Continuous Infusions:  sodium chloride       ceFAZolin  (ANCEF ) IV       LOS: 1 day    Time spent: 55 minutes    Etana Beets JONETTA Fairly, DO [facility]  If 7PM-7AM, please contact night-coverage [email or web address] 12/17/2023, 12:08 PM

## 2023-12-17 NOTE — Anesthesia Preprocedure Evaluation (Signed)
 Anesthesia Evaluation  Patient identified by MRN, date of birth, ID band Patient awake    Reviewed: Allergy & Precautions, H&P , NPO status , Patient's Chart, lab work & pertinent test results, reviewed documented beta blocker date and time   Airway Mallampati: II  TM Distance: >3 FB Neck ROM: full    Dental no notable dental hx.    Pulmonary sleep apnea , pneumonia   Pulmonary exam normal breath sounds clear to auscultation       Cardiovascular Exercise Tolerance: Good hypertension, + Peripheral Vascular Disease and +CHF  + Valvular Problems/Murmurs  Rhythm:regular Rate:Normal     Neuro/Psych  Headaches PSYCHIATRIC DISORDERS Anxiety Depression     Neuromuscular disease CVA    GI/Hepatic Neg liver ROS,GERD  ,,  Endo/Other  diabetes    Renal/GU Renal disease  negative genitourinary   Musculoskeletal   Abdominal   Peds  Hematology  (+) Blood dyscrasia, anemia   Anesthesia Other Findings   Reproductive/Obstetrics negative OB ROS                              Anesthesia Physical Anesthesia Plan  ASA: 4 and emergent  Anesthesia Plan: General   Post-op Pain Management:    Induction:   PONV Risk Score and Plan: Propofol  infusion  Airway Management Planned:   Additional Equipment:   Intra-op Plan:   Post-operative Plan:   Informed Consent: I have reviewed the patients History and Physical, chart, labs and discussed the procedure including the risks, benefits and alternatives for the proposed anesthesia with the patient or authorized representative who has indicated his/her understanding and acceptance.     Dental Advisory Given  Plan Discussed with: CRNA  Anesthesia Plan Comments:         Anesthesia Quick Evaluation

## 2023-12-17 NOTE — Progress Notes (Signed)
 Dr. Kendell, notified of bld glucose of 55 and 1/2 amp of D50 given and will check in at 1515

## 2023-12-18 DIAGNOSIS — K922 Gastrointestinal hemorrhage, unspecified: Secondary | ICD-10-CM | POA: Diagnosis not present

## 2023-12-18 LAB — BASIC METABOLIC PANEL WITH GFR
Anion gap: 10 (ref 5–15)
BUN: 22 mg/dL (ref 8–23)
CO2: 28 mmol/L (ref 22–32)
Calcium: 7.3 mg/dL — ABNORMAL LOW (ref 8.9–10.3)
Chloride: 98 mmol/L (ref 98–111)
Creatinine, Ser: 2.86 mg/dL — ABNORMAL HIGH (ref 0.44–1.00)
GFR, Estimated: 17 mL/min — ABNORMAL LOW (ref >60)
Glucose, Bld: 69 mg/dL — ABNORMAL LOW (ref 70–99)
Potassium: 3.9 mmol/L (ref 3.5–5.1)
Sodium: 136 mmol/L (ref 135–145)

## 2023-12-18 LAB — CBC
HCT: 26.5 % — ABNORMAL LOW (ref 36.0–46.0)
Hemoglobin: 8.7 g/dL — ABNORMAL LOW (ref 12.0–15.0)
MCH: 28.4 pg (ref 26.0–34.0)
MCHC: 32.8 g/dL (ref 30.0–36.0)
MCV: 86.6 fL (ref 80.0–100.0)
Platelets: 305 K/uL (ref 150–400)
RBC: 3.06 MIL/uL — ABNORMAL LOW (ref 3.87–5.11)
RDW: 16.7 % — ABNORMAL HIGH (ref 11.5–15.5)
WBC: 14.5 K/uL — ABNORMAL HIGH (ref 4.0–10.5)
nRBC: 0.1 % (ref 0.0–0.2)

## 2023-12-18 LAB — GLUCOSE, CAPILLARY: Glucose-Capillary: 72 mg/dL (ref 70–99)

## 2023-12-18 LAB — MAGNESIUM: Magnesium: 1.7 mg/dL (ref 1.7–2.4)

## 2023-12-18 MED ORDER — CEFAZOLIN SODIUM-DEXTROSE 2-4 GM/100ML-% IV SOLN: 2.0000 g | Freq: Once | INTRAVENOUS | Status: DC

## 2023-12-18 MED ORDER — APIXABAN 2.5 MG PO TABS: 2.5000 mg | ORAL_TABLET | Freq: Two times a day (BID) | ORAL | 2 refills | Status: DC

## 2023-12-18 NOTE — Plan of Care (Signed)

## 2023-12-18 NOTE — Discharge Summary (Signed)
 Physician Discharge Summary  Kathryn Beck FMW:[id number] DOB: 13-Jul-1955 DOA: 12/15/2023  PCP: Antonetta Rollene BRAVO, MD  Admit date: 12/15/2023  Discharge date: 12/18/2023  Admitted From: Home  Disposition: Home  Recommendations for Outpatient Follow-up:  Follow up with PCP in 1-2 weeks Eliquis  dosing changed from 5 mg twice daily to 2.5 mg twice daily given ESRD status and this will be resumed on 10/1 as recommended per GI.  If she has further bleeding episodes with this dosing, will anticoagulation should be indefinitely discontinued Continue other home medications as prior Continue hemodialysis as per routine on MWF with last session 9/26 Follow-up with GI Dr. Cindie which will be scheduled in 6-8 weeks Continue IV cefazolin  3 times daily with HD with EOT 01/10/2024 for dry gangrene of left hand Referral to urology for follow-up on right renal lesion  Home Health: None  Equipment/Devices: None  Discharge Condition:Stable  CODE STATUS: Full  Diet recommendation: Renal/carb modified  Brief/Interim Summary: Kathryn Beck is a 68 y.o. female with hx of Atrial fib on apixaban , PAD  (s/p R BKA in 05/2022, prior lithotripsy and stenting of the left superficial femoral and popliteal artery in 06/2022 and ultimately L BKA in 09/2022), HTN, HLD, Type 2 DM, bilateral adrenal adenoma (followed by Endocrinology), chronic HFpEF, aortic stenosis, OSA, ESRD (MWF), history of TIA's, history of recurrent chest pain  (cath in 12/2018 showing normal coronary arteries) recent hospitalization for left hand I&D, MSSA infection on antibiotics IV Ancef  with HD, who was recently hospitalized 9/10 - 9/16 for symptomatic anemia and possible GI bleeding and underwent colonoscopy demonstrating diverticulosis and 2 polyps removed, with likely diverticular bleed as source, restarted on [facility] 9/15. Returns with rectal bleeding.  Patient was admitted for severe symptomatic acute blood loss anemia and 3 units of PRBCs  and has been seen by GI and underwent colonoscopy on 9/26 with findings of post polypectomy bleed noted and 1 clip placed.  Several other polyps were removed as well and no other overt bleeding has been noted since admission with stable hemoglobin levels.  Recommendations are to resume anticoagulation on 10/1 at a lower dose as prescribed with the hopes that she will have no further bleeding.  She underwent hemodialysis on 9/26 as well and may resume her usual hemodialysis as per routine.  She was incidentally noted to have findings of a growth to her kidney for which she has been referred to urology for further evaluation.  Discharge Diagnoses:  Principal Problem:   Lower GI bleed Active Problems:   Severe anemia  Principal discharge diagnosis: Acute blood loss anemia secondary to post polypectomy bleeding in the setting of Eliquis  use status post 3 unit PRBC transfusion.  Discharge Instructions  Discharge Instructions     Diet - low sodium heart healthy   Complete by: As directed    Increase activity slowly   Complete by: As directed    Leave dressing on - Keep it clean, dry, and intact until clinic visit   Complete by: As directed       Allergies as of 12/18/2023       Reactions   Ace Inhibitors Anaphylaxis, Swelling   Penicillins Itching, Swelling   Has tolerated cefazolin  on multiple occasions    Statins Other (See Comments)   Elevated LFT's   Albuterol  Swelling        Medication List     STOP taking these medications    aspirin  81 MG chewable tablet  TAKE these medications    acetaminophen  500 MG tablet Commonly known as: TYLENOL  Take 1,000 mg by mouth every 6 (six) hours as needed for mild pain (pain score 1-3).   amLODipine  10 MG tablet Commonly known as: NORVASC  Take 0.5 tablets (5 mg total) by mouth daily.   apixaban  2.5 MG Tabs tablet Commonly known as: Eliquis  Take 1 tablet (2.5 mg total) by mouth 2 (two) times daily. Start taking on: December 22, 2023 What changed:  medication strength how much to take additional instructions These instructions start on December 22, 2023. If you are unsure what to do until then, ask your doctor or other care provider.   busPIRone  7.5 MG tablet Commonly known as: BUSPAR  Take 1 tablet (7.5 mg total) by mouth 2 (two) times daily.   calcitRIOL  0.5 MCG capsule Commonly known as: ROCALTROL  Take 2 capsules (1 mcg total) by mouth every Monday, Wednesday, and Friday with hemodialysis.   CEFAZOLIN  SODIUM IV Inject into the vein every Monday, Wednesday, and Friday with hemodialysis.   cinacalcet  30 MG tablet Commonly known as: SENSIPAR  Take 3 tablets (90 mg total) by mouth every Monday, Wednesday, and Friday.   ezetimibe  10 MG tablet Commonly known as: ZETIA  Take 1 tablet (10 mg total) by mouth daily.   insulin  aspart 100 UNIT/ML injection Commonly known as: novoLOG  Inject 0-6 Units into the skin 3 (three) times daily with meals. CBG 70 - 120: 0 units  CBG 121 - 150: 0 units  CBG 151 - 200: 1 unit  CBG 201-250: 2 units  CBG 251-300: 3 units  CBG 301-350: 4 units  CBG 351-400: 5 units  CBG > 400: Give 10 units and call MD   metoprolol  succinate 50 MG 24 hr tablet Commonly known as: TOPROL -XL TAKE (1) TABLET BY MOUTH DAILY WITH FOOD *TAKE AFTER DIALYSIS* What changed:  how much to take how to take this when to take this additional instructions   ondansetron  4 MG tablet Commonly known as: ZOFRAN  Take 4 mg by mouth every 8 (eight) hours as needed for nausea or vomiting.   pantoprazole  40 MG tablet Commonly known as: PROTONIX  Take 1 tablet (40 mg total) by mouth daily.   Rena-Vite Rx 1 MG Tabs Take 1 tablet by mouth daily.   sertraline  50 MG tablet Commonly known as: ZOLOFT  Take one and a half tablets once daily What changed:  how much to take how to take this when to take this   torsemide  100 MG tablet Commonly known as: DEMADEX  Take 1 tablet (100 mg total) by mouth every  Tuesday, Thursday, Saturday, and Sunday. What changed: additional instructions   Toujeo Max SoloStar 300 UNIT/ML Solostar Pen Generic drug: insulin glargine (2 Unit Dial) Inject 30 Units into the skin daily. May need to slowly increase the dose depending upon your blood sugar, follow-up with PCP   [name] 500 MG chewable tablet Generic drug: sucroferric oxyhydroxide Chew 1-2 tablets (500-1,000 mg total) by mouth See admin instructions. Take 1000mg (2 tablets) by mouth with meals and 500mg (1 tablet) with snacks               Discharge Care Instructions  (From admission, onward)           Start     Ordered   12/18/23 0000  Leave dressing on - Keep it clean, dry, and intact until clinic visit        09 /27/25 0904  Follow-up Information     Antonetta Rollene BRAVO, MD. Schedule an appointment as soon as possible for a visit in 1 week(s).   Specialty: Family Medicine Contact information: [address] [place] [address] [phone]         Cindie Carlin POUR, DO. Go in 6 week(s).   Specialty: Gastroenterology Why: Appt will be scheduled for 6-8 weeks. Contact information: [address] [place] [address] [phone]                Allergies  Allergen Reactions   Ace Inhibitors Anaphylaxis and Swelling   Penicillins Itching and Swelling    Has tolerated cefazolin  on multiple occasions    Statins Other (See Comments)    Elevated LFT's   Albuterol  Swelling    Consultations: GI Nephrology   Procedures/Studies: CT ANGIO GI BLEED Result Date: 12/16/2023 CLINICAL DATA:  Persistent GI bleed status post colonoscopy with polypectomy EXAM: CTA ABDOMEN AND PELVIS WITHOUT AND WITH CONTRAST TECHNIQUE: Multidetector CT imaging of the abdomen and pelvis was performed using the standard protocol during bolus administration of intravenous contrast. Multiplanar reconstructed images and MIPs were obtained and reviewed to evaluate the  vascular anatomy. RADIATION DOSE REDUCTION: This exam was performed according to the departmental dose-optimization program which includes automated exposure control, adjustment of the mA and/or kV according to patient size and/or use of iterative reconstruction technique. CONTRAST:  OMNIPAQUE  IOHEXOL  350 MG/ML SOLN COMPARISON:  CT abdomen and pelvis dated 03/24/2017 FINDINGS: VASCULAR Aorta: Normal caliber aorta and branch vessels without aneurysm, dissection, vasculitis or significant stenosis. No active extravasation. Aortic atherosclerosis. Conventional celiac branching pattern. Single bilateral renal arteries demonstrating mild luminal narrowing of the origins due to atherosclerotic plaque. Mild to moderate luminal narrowing of the internal iliac arteries bilaterally due to atherosclerotic plaque. Proximal Outflow: Segmental mild luminal narrowing of bilateral superficial femoral arteries due to atherosclerotic plaque. No aneurysm. Veins: No obvious venous abnormality within the limitations of this arterial phase study. Review of the MIP images confirms the above findings. NON-VASCULAR Lower chest: Unchanged left lower lobe scarring and pleural thickening. No pneumothorax. Mild multichamber cardiomegaly. Coronary artery calcifications. Hepatobiliary: Subcentimeter hypodensity in the caudate lobe (15:22), too small to characterize. No intra or extrahepatic biliary ductal dilation. Normal gallbladder. Pancreas: No focal lesions or main ductal dilation. Spleen: Normal in size without focal abnormality. Adrenals/Urinary Tract: Unchanged bilateral adrenal nodules measuring 1.7 cm, 29 HU on the right and 1.9 cm, 20 HU on the left, likely adenomas. No specific follow-up imaging recommended. No hydronephrosis or renal calculi. Bilateral renal cysts, many of which are too small to characterize. A 1.3 cm exophytic enhancing lesion arising from the posteromedial aspect of the lower pole right kidney (15:48) is new  from 2019. No focal bladder wall thickening. Stomach/Bowel: Normal appearance of the stomach. No evidence of bowel wall thickening, distention, or inflammatory changes. Colonic diverticulosis without acute diverticulitis. Appendix is not discretely seen. Lymphatic: No enlarged abdominal or pelvic lymph nodes. Reproductive: No adnexal masses. Other: No free fluid, fluid collection, or free air. Sequela of fat necrosis in the midline anterior peritoneum (4:51). Musculoskeletal: No acute or abnormal lytic or blastic osseous lesions. Postsurgical changes of the anterior abdominal wall. Metallic clips adjacent to the right inguinal region and in the lower quadrant. Multilevel degenerative changes of the partially imaged thoracic and lumbar spine. IMPRESSION: 1. No active extravasation. 2. A 1.3 cm exophytic enhancing lesion arising from the posteromedial aspect of the lower pole right kidney is  new from 2019 and suspicious for renal cell carcinoma. Recommend consultation to urology. 3. Colonic diverticulosis without acute diverticulitis. 4. Aortic Atherosclerosis (ICD10-I70.0). Coronary artery calcifications. Assessment for potential risk factor modification, dietary therapy or pharmacologic therapy may be warranted, if clinically indicated. Electronically Signed   By: Limin  Xu M.D.   On: 12/16/2023 14:54   DG CHEST PORT 1 VIEW Result Date: 12/06/2023 CLINICAL DATA:  Leukocytosis EXAM: PORTABLE CHEST 1 VIEW COMPARISON:  Radiograph 11/10/2023 FINDINGS: Stable large cardiac silhouette. There is increased patchy airspace disease in the LEFT upper lobe and LEFT lower lobe. RIGHT lung clear. No pneumothorax. No acute osseous abnormality. IMPRESSION: New airspace patchy density in the LEFT lung. Findings concerning for multifocal pneumonia. Electronically Signed   By: Jackquline Boxer M.D.   On: 12/06/2023 13:52     Discharge Exam: Vitals:   12/17/23 2158 12/18/23 0625  BP: (!) 154/73 (!) 158/71  Pulse: 66 95   Resp: 18 18  Temp: 98.4 F (36.9 C) 97.7 F (36.5 C)  SpO2: 100% 96%   Vitals:   12/17/23 1447 12/17/23 1558 12/17/23 2158 12/18/23 0625  BP: 139/61  (!) 154/73 (!) 158/71  Pulse: 72  66 95  Resp: 18  18 18   Temp: 98 F (36.7 C) 97.6 F (36.4 C) 98.4 F (36.9 C) 97.7 F (36.5 C)  TempSrc: Oral  Oral Oral  SpO2: 100% 100% 100% 96%  Weight: 70.5 kg   69.2 kg  Height: 5' 4 (1.626 m)       General: Pt is alert, awake, not in acute distress Cardiovascular: RRR, S1/S2 +, no rubs, no gallops Respiratory: CTA bilaterally, no wheezing, no rhonchi Abdominal: Soft, NT, ND, bowel sounds + Extremities: no edema, no cyanosis    The results of significant diagnostics from this hospitalization (including imaging, microbiology, ancillary and laboratory) are listed below for reference.     Microbiology: Recent Results (from the past 240 hours)  MRSA Next Gen by PCR, Nasal     Status: None   Collection Time: 12/16/23  9:14 AM   Specimen: Nasal Mucosa; Nasal Swab  Result Value Ref Range Status   MRSA by PCR Next Gen NOT DETECTED NOT DETECTED Final    Comment: (NOTE) The GeneXpert MRSA Assay (FDA approved for NASAL specimens only), is one component of a comprehensive MRSA colonization surveillance program. It is not intended to diagnose MRSA infection nor to guide or monitor treatment for MRSA infections. Test performance is not FDA approved in patients less than 3 years old. Performed at [facility], [address], [place], [place] [address]      Labs: BNP (last 3 results) Recent Labs    12/28/22 0842  BNP 2,982.0*   Basic Metabolic Panel: Recent Labs  Lab 12/15/23 2105 12/16/23 1247 12/17/23 0415 12/17/23 0900 12/18/23 0423  NA 146* 145 142 140 136  K 4.1 5.1 5.5* 5.5* 3.9  CL 105 105 106 101 98  CO2 27 24 24 23 28   GLUCOSE 260* 108* 82 66* 69*  BUN 39* 47* 52* 54* 22  CREATININE 3.97* 4.78* 4.99* 5.39* 2.86*  CALCIUM  8.2* 7.8* 7.5* 7.6* 7.3*  MG  --  1.9  1.8  --  1.7  PHOS  --  5.3*  --  5.6*  --    Liver Function Tests: Recent Labs  Lab 12/15/23 2105 12/17/23 0415 12/17/23 0900  AST 13* 11*  --   ALT <5 <5  --   ALKPHOS 54 51  --  BILITOT 0.5 0.7  --   PROT 5.0* 4.9*  --   ALBUMIN  2.4* 2.4* 2.4*   No results for input(s): LIPASE, AMYLASE in the last 168 hours. No results for input(s): AMMONIA in the last 168 hours. CBC: Recent Labs  Lab 12/15/23 2105 12/16/23 1247 12/16/23 2040 12/17/23 0415 12/17/23 0900 12/17/23 1000 12/18/23 0423  WBC 9.0 12.4*  --  13.5* 13.2*  --  14.5*  HGB 5.3* 9.2* 9.2* 8.9* 8.4* 8.6* 8.7*  HCT 18.0* 27.9* 27.8* 27.4* 26.1* 26.2* 26.5*  MCV 85.7 84.8  --  85.6 85.0  --  86.6  PLT 259 235  --  269 286  --  305   Cardiac Enzymes: No results for input(s): CKTOTAL, CKMB, CKMBINDEX, TROPONINI in the last 168 hours. BNP: Invalid input(s): POCBNP CBG: Recent Labs  Lab 12/17/23 1452 12/17/23 1603 12/17/23 2155 12/17/23 2235 12/18/23 0727  GLUCAP 55* 128* 58* 145* 72   D-Dimer No results for input(s): DDIMER in the last 72 hours. Hgb A1c No results for input(s): HGBA1C in the last 72 hours. Lipid Profile No results for input(s): CHOL, HDL, LDLCALC, TRIG, CHOLHDL, LDLDIRECT in the last 72 hours. Thyroid  function studies No results for input(s): TSH, T4TOTAL, T3FREE, THYROIDAB in the last 72 hours.  Invalid input(s): FREET3 Anemia work up No results for input(s): VITAMINB12, FOLATE, FERRITIN, TIBC, IRON, RETICCTPCT in the last 72 hours. Urinalysis    Component Value Date/Time   COLORURINE AMBER (A) 11/10/2023 1101   APPEARANCEUR TURBID (A) 11/10/2023 1101   LABSPEC 1.017 11/10/2023 1101   PHURINE 5.0 11/10/2023 1101   GLUCOSEU NEGATIVE 11/10/2023 1101   HGBUR LARGE (A) 11/10/2023 1101   BILIRUBINUR NEGATIVE 11/10/2023 1101   BILIRUBINUR neg 02/15/2017 0913   KETONESUR NEGATIVE 11/10/2023 1101   PROTEINUR 100 (A) 11/10/2023  1101   UROBILINOGEN 0.2 02/15/2017 0913   NITRITE NEGATIVE 11/10/2023 1101   LEUKOCYTESUR MODERATE (A) 11/10/2023 1101   Sepsis Labs Recent Labs  Lab 12/16/23 1247 12/17/23 0415 12/17/23 0900 12/18/23 0423  WBC 12.4* 13.5* 13.2* 14.5*   Microbiology Recent Results (from the past 240 hours)  MRSA Next Gen by PCR, Nasal     Status: None   Collection Time: 12/16/23  9:14 AM   Specimen: Nasal Mucosa; Nasal Swab  Result Value Ref Range Status   MRSA by PCR Next Gen NOT DETECTED NOT DETECTED Final    Comment: (NOTE) The GeneXpert MRSA Assay (FDA approved for NASAL specimens only), is one component of a comprehensive MRSA colonization surveillance program. It is not intended to diagnose MRSA infection nor to guide or monitor treatment for MRSA infections. Test performance is not FDA approved in patients less than 65 years old. Performed at [facility], [address], [place], [place] [address]      Time coordinating discharge: 35 minutes  SIGNED:   Adron JONETTA Fairly, DO [facility] 12/18/2023, 9:05 AM  If 7PM-7AM, please contact night-coverage [email or web address]

## 2023-12-18 NOTE — Progress Notes (Signed)
 Patient without complaints this morning tolerating regular diet.  No blood per rectum/no stool since procedure yesterday.  Hemoglobin holding holding steady at 8.7  Vital signs in last 24 hours: Temp:  [97.6 F (36.4 C)-98.4 F (36.9 C)] 97.7 F (36.5 C) (09/27 0625) Pulse Rate:  [40-95] 95 (09/27 0625) Resp:  [8-25] 18 (09/27 0625) BP: (111-188)/(55-95) 158/71 (09/27 0625) SpO2:  [96 %-100 %] 96 % (09/27 0625) Weight:  [69.2 kg-71.8 kg] 69.2 kg (09/27 0625) Last BM Date : 12/16/23 General:   Alert,  pleasant and cooperative in NAD; companied by her husband. Abdomen: Nondistended positive bowel sounds soft and nontender   Intake/Output from previous day: 09/26 0701 - 09/27 0700 In: 563 [P.O.:360; I.V.:203] Out: 1300  Intake/Output this shift: No intake/output data recorded.  Lab Results: Recent Labs    12/17/23 0415 12/17/23 0900 12/17/23 1000 12/18/23 0423  WBC 13.5* 13.2*  --  14.5*  HGB 8.9* 8.4* 8.6* 8.7*  HCT 27.4* 26.1* 26.2* 26.5*  PLT 269 286  --  305   BMET Recent Labs    12/17/23 0415 12/17/23 0900 12/18/23 0423  NA 142 140 136  K 5.5* 5.5* 3.9  CL 106 101 98  CO2 24 23 28   GLUCOSE 82 66* 69*  BUN 52* 54* 22  CREATININE 4.99* 5.39* 2.86*  CALCIUM  7.5* 7.6* 7.3*   LFT Recent Labs    12/17/23 0415 12/17/23 0900  PROT 4.9*  --   ALBUMIN  2.4* 2.4*  AST 11*  --   ALT <5  --   ALKPHOS 51  --   BILITOT 0.7  --    PT/INR Recent Labs    12/15/23 2105  LABPROT 15.4*  INR 1.2    Impression:  68 year old lady with multiple comorbidities including atrial fibrillation anticoagulated, aortic stenosis and chronic renal failure on renal replacement therapy admitted with hematochezia and profound anemia(hemoglobin 5 3).  CTA negative for active bleeding but revealed a suspicious right renal lesion.  EGD this admission revealed no obvious site of bleeding although there were retained gastric contents.  Colonoscopy yesterday demonstrated active  bleeding from an ascending colon cold snare polypectomy site.  This lesion was clipped with good hemostasis.  Other polyps also removed.  Prominent hemorrhoids.  Pancolonic diverticulosis.  Doing very well this morning.  In the setting of chronic constipation and anticoagulation I suspect her low volume rectal bleeding more likely due to hemorrhoids predating admission 2 weeks ago.  Postpolypectomy bleeding explains large-volume hematochezia precipitating this admission.  Recommendations:  -If anticoagulation continues to be clinically indicated, would not resume Eliquis  prior to October 1  -Follow-up on pathology from polyps removed yesterday  -Avoid constipation; MiraLAX  daily  -Further evaluation of new right renal lesion per attending  -Follow-up with Dr. Cindie in 6 to 8 weeks  -From a GI standpoint the patient could be discharged later today  -Office visit with Dr. Cindie in 6 to 8 weeks  I discussed my findings and recommendations at length with patient and her husband at the bedside.  Signing off.

## 2023-12-20 ENCOUNTER — Other Ambulatory Visit: Payer: Self-pay

## 2023-12-20 ENCOUNTER — Other Ambulatory Visit: Payer: Self-pay | Admitting: Family Medicine

## 2023-12-20 ENCOUNTER — Telehealth: Payer: Self-pay | Admitting: *Deleted

## 2023-12-20 ENCOUNTER — Encounter (HOSPITAL_COMMUNITY): Payer: Self-pay | Admitting: Internal Medicine

## 2023-12-20 DIAGNOSIS — N186 End stage renal disease: Secondary | ICD-10-CM | POA: Diagnosis not present

## 2023-12-20 DIAGNOSIS — L03114 Cellulitis of left upper limb: Secondary | ICD-10-CM | POA: Diagnosis not present

## 2023-12-20 DIAGNOSIS — N2581 Secondary hyperparathyroidism of renal origin: Secondary | ICD-10-CM | POA: Diagnosis not present

## 2023-12-20 DIAGNOSIS — Z992 Dependence on renal dialysis: Secondary | ICD-10-CM | POA: Diagnosis not present

## 2023-12-20 NOTE — Progress Notes (Signed)
 D/c over weekend noted. Contacted out-pt hd davita Benson to inform of pt d/c and that pt should have resumed this morning. D/c summary faxed with last note. No further support needed.   Jacolby Risby Dialysis Navigator [phone]

## 2023-12-20 NOTE — Transitions of Care (Post Inpatient/ED Visit) (Signed)
   12/20/2023  Name: LEQUISHA CAMMACK MRN: [id number] DOB: Nov 21, 1955  Today's TOC FU Call Status: Today's TOC FU Call Status:: Unsuccessful Call (1st Attempt) Unsuccessful Call (1st Attempt) Date: 12/20/23  Attempted to reach the patient regarding the most recent Inpatient/ED visit.  Follow Up Plan: Additional outreach attempts will be made to reach the patient to complete the Transitions of Care (Post Inpatient/ED visit) call.   Cathlean Headland BSN RN [facility] Health Care Management Coordinator [email or web address] Direct Dial : [phone]  Fax: [phone] Website: [email or web address]

## 2023-12-21 ENCOUNTER — Ambulatory Visit: Payer: Self-pay | Admitting: Internal Medicine

## 2023-12-21 ENCOUNTER — Telehealth: Payer: Self-pay

## 2023-12-21 ENCOUNTER — Encounter: Payer: Self-pay | Admitting: Internal Medicine

## 2023-12-21 ENCOUNTER — Telehealth (INDEPENDENT_AMBULATORY_CARE_PROVIDER_SITE_OTHER): Admitting: Internal Medicine

## 2023-12-21 ENCOUNTER — Inpatient Hospital Stay: Admitting: Internal Medicine

## 2023-12-21 DIAGNOSIS — K922 Gastrointestinal hemorrhage, unspecified: Secondary | ICD-10-CM

## 2023-12-21 DIAGNOSIS — L03114 Cellulitis of left upper limb: Secondary | ICD-10-CM | POA: Diagnosis not present

## 2023-12-21 DIAGNOSIS — E1122 Type 2 diabetes mellitus with diabetic chronic kidney disease: Secondary | ICD-10-CM | POA: Diagnosis not present

## 2023-12-21 DIAGNOSIS — Z8673 Personal history of transient ischemic attack (TIA), and cerebral infarction without residual deficits: Secondary | ICD-10-CM | POA: Diagnosis not present

## 2023-12-21 DIAGNOSIS — I70203 Unspecified atherosclerosis of native arteries of extremities, bilateral legs: Secondary | ICD-10-CM | POA: Diagnosis not present

## 2023-12-21 DIAGNOSIS — J9811 Atelectasis: Secondary | ICD-10-CM | POA: Diagnosis not present

## 2023-12-21 DIAGNOSIS — I4891 Unspecified atrial fibrillation: Secondary | ICD-10-CM | POA: Diagnosis not present

## 2023-12-21 DIAGNOSIS — Z89511 Acquired absence of right leg below knee: Secondary | ICD-10-CM | POA: Diagnosis not present

## 2023-12-21 DIAGNOSIS — I132 Hypertensive heart and chronic kidney disease with heart failure and with stage 5 chronic kidney disease, or end stage renal disease: Secondary | ICD-10-CM | POA: Diagnosis not present

## 2023-12-21 DIAGNOSIS — L02512 Cutaneous abscess of left hand: Secondary | ICD-10-CM | POA: Diagnosis not present

## 2023-12-21 DIAGNOSIS — E1151 Type 2 diabetes mellitus with diabetic peripheral angiopathy without gangrene: Secondary | ICD-10-CM | POA: Diagnosis not present

## 2023-12-21 DIAGNOSIS — N186 End stage renal disease: Secondary | ICD-10-CM

## 2023-12-21 DIAGNOSIS — N2581 Secondary hyperparathyroidism of renal origin: Secondary | ICD-10-CM | POA: Diagnosis not present

## 2023-12-21 DIAGNOSIS — I96 Gangrene, not elsewhere classified: Secondary | ICD-10-CM | POA: Diagnosis not present

## 2023-12-21 DIAGNOSIS — I350 Nonrheumatic aortic (valve) stenosis: Secondary | ICD-10-CM | POA: Diagnosis not present

## 2023-12-21 DIAGNOSIS — T8742 Infection of amputation stump, left upper extremity: Secondary | ICD-10-CM | POA: Diagnosis not present

## 2023-12-21 DIAGNOSIS — Z992 Dependence on renal dialysis: Secondary | ICD-10-CM

## 2023-12-21 DIAGNOSIS — B9561 Methicillin susceptible Staphylococcus aureus infection as the cause of diseases classified elsewhere: Secondary | ICD-10-CM | POA: Diagnosis not present

## 2023-12-21 DIAGNOSIS — E782 Mixed hyperlipidemia: Secondary | ICD-10-CM | POA: Diagnosis not present

## 2023-12-21 DIAGNOSIS — D649 Anemia, unspecified: Secondary | ICD-10-CM | POA: Diagnosis not present

## 2023-12-21 DIAGNOSIS — Z09 Encounter for follow-up examination after completed treatment for conditions other than malignant neoplasm: Secondary | ICD-10-CM

## 2023-12-21 DIAGNOSIS — K219 Gastro-esophageal reflux disease without esophagitis: Secondary | ICD-10-CM | POA: Diagnosis not present

## 2023-12-21 DIAGNOSIS — G4733 Obstructive sleep apnea (adult) (pediatric): Secondary | ICD-10-CM | POA: Diagnosis not present

## 2023-12-21 DIAGNOSIS — D631 Anemia in chronic kidney disease: Secondary | ICD-10-CM | POA: Diagnosis not present

## 2023-12-21 DIAGNOSIS — I5032 Chronic diastolic (congestive) heart failure: Secondary | ICD-10-CM | POA: Diagnosis not present

## 2023-12-21 DIAGNOSIS — I480 Paroxysmal atrial fibrillation: Secondary | ICD-10-CM | POA: Diagnosis not present

## 2023-12-21 LAB — SURGICAL PATHOLOGY

## 2023-12-21 NOTE — Assessment & Plan Note (Signed)
 S/p colonoscopy in 09/25 X 2 - diverticulosis and 2 polyps removed, with likely diverticular bleed as source and later, post polypectomy bleed noted and 1 clip placed S/p 3 U PRBC transfusion Last CBC reviewed - Hb improved to 8.7 Does not report any active bleeding such as melena or hematochezia Plan to restart Eliquis  at a lower dose from tomorrow Continue pantoprazole  40 mg QD Follow-up with GI in outpatient setting

## 2023-12-21 NOTE — Patient Instructions (Signed)
 Please start taking Eliquis  2.5 mg twice daily from 12/22/23.  Please follow-up with GI as scheduled.  Please get medical attention immediately if you start having dark stools or blood in stool.

## 2023-12-21 NOTE — Assessment & Plan Note (Signed)
 Hospital chart reviewed, including discharge summary Medications reconciled and reviewed with the patient in detail

## 2023-12-21 NOTE — Assessment & Plan Note (Signed)
 Rate controlled with metoprolol  50 mg QD Was on Eliquis  5 mg twice daily, was recently reduced to 2.5 mg twice daily due to recurrent GI bleeding Followed by cardiology

## 2023-12-21 NOTE — Assessment & Plan Note (Addendum)
-   On hemodialysis MWF Follow-up CBC and BMP from dialysis center

## 2023-12-21 NOTE — Transitions of Care (Post Inpatient/ED Visit) (Signed)
 12/21/2023  Name: Kathryn Beck MRN: [id number] DOB: [birth year]  Today's TOC FU Call Status: Today's TOC FU Call Status:: Successful TOC FU Call Completed Patient's Name and Date of Birth confirmed.  Transition Care Management Follow-up Telephone Call Date of Discharge: 12/18/23 [facility] Type of Discharge: Inpatient Admission Primary Inpatient Discharge Diagnosis:: GI Bleed How have you been since you were released from the hospital?: Better Any questions or concerns?: No  Items Reviewed: Did you receive and understand the discharge instructions provided?: Yes Medications obtained,verified, and reconciled?: Yes (Medications Reviewed) Any new allergies since your discharge?: No Dietary orders reviewed?: Yes Type of Diet Ordered:: Renal Carb Mod with fluid restrictions (Dialysis pt) Do you have support at home?: Yes People in Home [RPT]: child(ren), adult Name of Support/Comfort Primary Source: [name] - daughter  Medications Reviewed Today: Medications Reviewed Today     Reviewed by Eilleen Richerd GRADE, RN (Registered Nurse) on 12/21/23 at 1516  Med List Status: <None>   Medication Order Taking? Sig Documenting Provider Last Dose Status Informant  0.9 %  sodium chloride  infusion [id number]   Lockamy, Randi L, NP-C  Active   acetaminophen  (TYLENOL ) 500 MG tablet [id number] Yes Take 1,000 mg by mouth every 6 (six) hours as needed for mild pain (pain score 1-3). [provider]  Active Child, Pharmacy Records  amLODipine  (NORVASC ) 10 MG tablet [id number] Yes Take 0.5 tablets (5 mg total) by mouth daily. Pearlean Manus, MD  Active Child, Pharmacy Records           Med Note (WARD, CHUCK MATSU   Wed Nov 10, 2023  7:52 PM)    apixaban  (ELIQUIS ) 2.5 MG TABS tablet [id number] Yes Take 1 tablet (2.5 mg total) by mouth 2 (two) times daily. Maree, Pratik D, DO  Active   B Complex-C-Folic Acid  (RENA-VITE RX) 1 MG TABS [id number] Yes Take 1 tablet by mouth  daily. [provider]  Active Child, Pharmacy Records           Med Note WORLEY, ALASKA S   Thu Nov 19, 2022 11:34 AM)    busPIRone  (BUSPAR ) 7.5 MG tablet [id number] Yes Take 1 tablet (7.5 mg total) by mouth 2 (two) times daily. Antonetta Rollene BRAVO, MD  Active Child, Pharmacy Records  calcitRIOL  (ROCALTROL ) 0.5 MCG capsule [id number] Yes Take 2 capsules (1 mcg total) by mouth every Monday, Wednesday, and Friday with hemodialysis. Caleen Burgess BROCKS, MD  Active Child, Pharmacy Records           Med Note DRENA, CHUCK MATSU [place] Dec 01, 2023  5:33 PM)    CEFAZOLIN  SODIUM IV [id number] Yes Inject into the vein every Monday, Wednesday, and Friday with hemodialysis. [provider]  Active Child, Pharmacy Records  cinacalcet  (SENSIPAR ) 30 MG tablet [id number] Yes Take 3 tablets (90 mg total) by mouth every Monday, Wednesday, and Friday. Vicci Afton LITTIE, MD  Active Child, Pharmacy Records  ezetimibe  (ZETIA ) 10 MG tablet [id number] Yes Take 1 tablet (10 mg total) by mouth daily. Antonetta Rollene BRAVO, MD  Active Child, Pharmacy Records    Discontinued 05/28/11 1302 (Patient has not taken in last 30 days)     Discontinued 05/28/11 1302 (Change in therapy)   insulin  aspart (NOVOLOG ) 100 UNIT/ML injection [id number] Yes Inject 0-6 Units into the skin 3 (three) times daily with meals. CBG 70 - 120: 0 units  CBG 121 - 150: 0 units  CBG 151 - 200: 1 unit  CBG 201-250: 2  units  CBG 251-300: 3 units  CBG 301-350: 4 units  CBG 351-400: 5 units  CBG > 400: Give 10 units and call MD Willette Adriana LABOR, MD  Active Child, Pharmacy Records  insulin  glargine, 2 Unit Dial , (TOUJEO  MAX SOLOSTAR) 300 UNIT/ML Solostar Pen [id number] Yes Inject 30 Units into the skin daily. May need to slowly increase the dose depending upon your blood sugar, follow-up with PCP Trixie File, MD  Active Child, Pharmacy Records           Med Note WORLEY, ALASKA S   Wed Oct 27, 2023  2:44 PM)    metoprolol  succinate  (TOPROL -XL) 50 MG 24 hr tablet [id number] Yes TAKE ONE TABLET BY MOUTH ONCE DAILY WITH FOOD (TAKE AFTER DIALYSIS) Antonetta Rollene BRAVO, MD  Active   ondansetron  (ZOFRAN ) 4 MG tablet [id number] Yes Take 4 mg by mouth every 8 (eight) hours as needed for nausea or vomiting. [provider]  Active Child, Pharmacy Records  pantoprazole  (PROTONIX ) 40 MG tablet [id number] Yes Take 1 tablet (40 mg total) by mouth daily. Vicci Afton CROME, MD  Active Child, Pharmacy Records  sertraline  (ZOLOFT ) 50 MG tablet [id number]  Take one and a half tablets once daily  Patient taking differently: Take 75 mg by mouth daily. Take one and a half tablets once daily   Antonetta Rollene BRAVO, MD  Active Child, Pharmacy Records  torsemide  (DEMADEX ) 100 MG tablet [id number]  Take 1 tablet (100 mg total) by mouth every Tuesday, Thursday, Saturday, and Sunday.  Patient taking differently: Take 100 mg by mouth every Tuesday, Thursday, Saturday, and Sunday. Non-dialysis days   Pearlean Manus, MD  Active Child, Pharmacy Records           Med Note [place], CHUCK KANDICE [place] Nov 10, 2023  7:52 PM)    [name]  500 MG chewable tablet [id number] Yes Chew 1-2 tablets (500-1,000 mg total) by mouth See admin instructions. Take 1000mg  (2 tablets) by mouth with meals and 500mg  (1 tablet) with snacks Antonetta Rollene BRAVO, MD  Active Child, Pharmacy Records           Med Note [place], DAWN S   Thu Dec 16, 2023  2:06 PM) Pt gets meds from Dr.  Suellyn List Note Sydell Heron NOVAK, RN 11/25/23 1342): Patient has dialysis on M-W-F, pt's daughter handles pt's medications 11/25/23 Medications list review with daughter, [name] on call today. Noted changes from PCP HFU visit.             Home Care and Equipment/Supplies: Were Home Health Services Ordered?: Yes Name of Home Health Agency:: [facility] Has Agency set up a time to come to your home?: No EMR reviewed for Home Health Orders:  (Daughter to call - nurse comes weekly) Any new  equipment or medical supplies ordered?: No  Functional Questionnaire: Do you need assistance with bathing/showering or dressing?: Yes Do you need assistance with meal preparation?: Yes Do you need assistance with eating?: Yes Do you have difficulty maintaining continence: Yes Do you need assistance with getting out of bed/getting out of a chair/moving?: Yes Do you have difficulty managing or taking your medications?: Yes  Follow up appointments reviewed: PCP Follow-up appointment confirmed?: Yes MD Provider Line [phone] Given: No Date of PCP follow-up appointment?: 12/21/23 (Telemedicine) Follow-up Provider: Dr. Suzzane Blanch Specialist [address] [facility] Follow-up appointment confirmed?: Yes Date of Specialist follow-up appointment?: 12/31/23 Follow-Up Specialty Provider:: Dr. Montie Cleverly - Infectious Disease Do you need transportation to your follow-up appointment?:  No Do you understand care options if your condition(s) worsen?: Yes-patient verbalized understanding ([facility] takes her to dialysis and appointments)  SDOH Interventions Today    Flowsheet Row Most Recent Value  SDOH Interventions   Food Insecurity Interventions Intervention Not Indicated  Housing Interventions Intervention Not Indicated  Transportation Interventions Intervention Not Indicated  Utilities Interventions Intervention Not Indicated    Goals Addressed             This Visit's Progress    [facility] Transitions of Care (TOC) Care Plan        Reviewed and or Updated with daughter on 12/21/23 with daughter as informant, not with patient,  completed.   Problems:  Recent Hospitalization for treatment of Abscess of Lt hand - ongoing treatment 12/21/23 GI Bleeding PCP HFU completed on 12/21/23 PCP with Telemedicine today. Infectious disease follow up 12/31/23 ongoing follow up with left hand wound daughter states healing well, home health  Goal:  Over the next 30 days, the patient will not experience  hospital readmission  Interventions:  Transitions of Care:  Daughter states no unmet needs or new concerns for patient on this call.  Transportation to HD was arranged via [facility] to [place] per daughter.  Reviewed medications/allergies/functional status/follow up appointments/review of systems/DME/Wound Care/home health.  Daughter reporting this call: denies patient complaints of chest pain or shortness of breath, patient mildly confused.  Daughter reports overall improvement and that she is doing a lot better, participating with [facility] PT in home, less confusion, and doing more.  Doctor Visits  - discussed the importance of doctor visits' Reviewed follow up visits coming up with specialists.  Contacted Health RN/OT/PT - Hedda has been to see patient and ordered wound care supplies. (Ongoing) Wound care reviewed.   ID follow up 12/31/23, gets Antibiotics with HD MWF PT coming to see patient and patient is participating with PT, per daughter.  Contacted DME company for patient use of equipment  Patient daughter has put in a call to see when the hospital bed will be delivered.  Resolved.  12/20/33 Patient has hospital bed and hoyer lift   Patient Self Care Activities: (Daughter Managing/assisting) Attend all scheduled provider appointments Call pharmacy for medication refills 3-7 days in advance of running out of medications Call provider office for new concerns or questions  Notify RN Care Manager of TOC call rescheduling needs Participate in Transition of Care Program/Attend TOC scheduled calls Take medications as prescribed   LUE Continue to keep the drain in her hand and do daily basic dressing changes . Use Vashe if possible. (Per prior notes).  Continue to monitor blood pressure and blood sugars per daughter assistance.  Weight monitored via HD on MWF.    Plan:  Next PCP appointment scheduled for: [id number] Infectious disease Appointment  [id number]  Telephone follow up appointment with care management team member scheduled for: 12/30/23 at 1 pm.  Continue with plan of care as discussed and reviewed.  The patient (Or contact of daughter as informant) has been provided with contact information for the care management team and has been advised to call with any health-related questions or concerns.  The patient's daughter Augusto verbalized understanding with current POC.  The patient is directed to their insurance card regarding availability of benefits coverage   Daughter interested in [employer] - will follow during 30 day TOC program and refer             [place]  Eilleen, RN, BSN, CCM [employer], [facility] RN Care Manager Direct Dial : [phone]

## 2023-12-21 NOTE — Progress Notes (Signed)
 Virtual Visit via Video Note   Because of Kathryn Beck's co-morbid illnesses, she is at least at moderate risk for complications without adequate follow up.  This format is felt to be most appropriate for this patient at this time.  All issues noted in this document were discussed and addressed.  A limited physical exam was performed with this format.      Evaluation Performed:  Follow-up visit  Date:  12/21/2023   ID:  Kathryn Beck, Kathryn Beck [birth year], MRN [id number]  Patient Location: Home Provider Location: Office/Clinic  Participants: Patient Location of Patient: Home Location of Provider: Telehealth Consent was obtain for visit to be over via telehealth. I verified that I am speaking with the correct person using two identifiers.  PCP:  Antonetta Rollene BRAVO, MD   Chief Complaint: Hospital discharge follow-up  History of Present Illness:    Kathryn Beck is a 68 y.o. female with PMH of Atrial fib on apixaban , PAD  (s/p R BKA in 05/2022, prior lithotripsy and stenting of the left superficial femoral and popliteal artery in 06/2022 and ultimately L BKA in 09/2022), HTN, HLD, Type 2 DM, bilateral adrenal adenoma (followed by Endocrinology), chronic HFpEF, aortic stenosis, OSA, ESRD (MWF), history of TIA's, history of recurrent chest pain  (cath in 12/2018 showing normal coronary arteries) recent hospitalization for left hand I&D, MSSA infection on antibiotics IV Ancef  with HD, who has a video visit for follow-up of recent hospitalization from 12/01/14-12/07/23 and again on 12/15/23-12/17/23.  She was recently hospitalized 9/10 - 9/16 for symptomatic anemia and possible GI bleeding and underwent colonoscopy demonstrating diverticulosis and 2 polyps removed, with likely diverticular bleed as source, restarted on [facility] 9/15. Returns with rectal bleeding.  Patient was admitted for severe symptomatic acute blood loss anemia and 3 units of PRBCs and has been seen by GI and underwent  colonoscopy on 9/26 with findings of post polypectomy bleed noted and 1 clip placed. She was incidentally noted to have findings of a growth to her kidney for which she has been referred to urology for further evaluation.  She is to start Eliquis  2.5 mg from 12/22/23. She has follow up with GI after 3 weeks. She denies any melena or hematochezia currently.  The patient does not have symptoms concerning for COVID-19 infection (fever, chills, cough, or new shortness of breath).   Past Medical, Surgical, Social History, Allergies, and Medications have been Reviewed.  Past Medical History:  Diagnosis Date   Acid reflux    Amputated left leg (HCC)    bilateral BKA   Anemia    Arthritis    Axillary masses    Soft tissue - status post excision   Back pain    CHF (congestive heart failure) (HCC)    COVID-19 virus infection 04/06/2019   Depression    End-stage renal disease (HCC)    M/W/F dialysis   Essential hypertension    Headache    years ago   History of blood transfusion    History of cardiac catheterization    Normal coronary arteries October 2020   History of claustrophobia    History of pneumonia 2019   Hypoxia 04/03/2019   Memory loss    Mixed hyperlipidemia    Obesity    Pancreatitis    Peritoneal dialysis catheter in place    Pneumonia due to COVID-19 virus 04/02/2019   Sleep apnea    Noncompliant with CPAP   Stroke (HCC)    mini stroke  Type 2 diabetes mellitus (HCC)    Past Surgical History:  Procedure Laterality Date   ABDOMINAL AORTOGRAM W/LOWER EXTREMITY N/A 04/30/2022   Procedure: ABDOMINAL AORTOGRAM W/LOWER EXTREMITY;  Surgeon: Magda Debby SAILOR, MD;  [facility];  Service: Cardiovascular;  Laterality: N/A;   ABDOMINAL AORTOGRAM W/LOWER EXTREMITY N/A 07/21/2022   Procedure: ABDOMINAL AORTOGRAM W/LOWER EXTREMITY;  Surgeon: Serene Gaile ORN, MD;  Location: [facility];  Service: Cardiovascular;  Laterality: N/A;   ABDOMINAL HYSTERECTOMY      ACHILLES TENDON LENGTHENING  08/15/2022   Procedure: ACHILLES TENDON LENGTHENING;  Surgeon: Silva Juliene SAUNDERS, DPM;  Location: [facility] OR;  Service: Podiatry;;   AMPUTATION Right 05/29/2022   Procedure: RIGHT BELOW THE KNEE AMPUTATION;  Surgeon: Harden Jerona GAILS, MD;  Location: [facility] OR;  Service: Orthopedics;  Laterality: Right;   AMPUTATION Left 09/04/2022   Procedure: AMPUTATION FOOT, serial irrigation;  Surgeon: Joya Stabs, DPM;  Location: [facility] OR;  Service: Podiatry;  Laterality: Left;  Surgical team to do block   AMPUTATION Left 10/07/2022   Procedure: LEFT BELOW KNEE AMPUTATION;  Surgeon: Harden Jerona GAILS, MD;  Location: [facility] OR;  Service: Orthopedics;  Laterality: Left;   AMPUTATION FINGER Left 09/29/2023   Procedure: AMPUTATION, FINGER;  Surgeon: Harden Jerona GAILS, MD;  Location: [facility] OR;  Service: Orthopedics;  Laterality: Left;  LEFT HAND LONG FINGER RAY AMPUTATION   AV FISTULA PLACEMENT Left 09/02/2017   Procedure: creation of left arm ARTERIOVENOUS (AV) FISTULA;  Surgeon: Serene Gaile ORN, MD;  Location: [facility] OR;  Service: Vascular;  Laterality: Left;   COLONOSCOPY  2008   Dr. Harvey: normal    COLONOSCOPY N/A 12/18/2016   Dr. Harvey: multiple tubular adenomas, internal hemorrhoids. Surveillance in 3 years    COLONOSCOPY N/A 12/05/2023   Procedure: COLONOSCOPY;  Surgeon: Cindie Carlin POUR, DO;  [facility];  Service: Endoscopy;  Laterality: N/A;   COLONOSCOPY N/A 12/17/2023   Procedure: COLONOSCOPY;  Surgeon: Shaaron Lamar HERO, MD;  [facility];  Service: Endoscopy;  Laterality: N/A;   ESOPHAGEAL DILATION N/A 10/13/2015   Procedure: ESOPHAGEAL DILATION;  Surgeon: Claudis RAYMOND Rivet, MD;  [facility];  Service: Endoscopy;  Laterality: N/A;   ESOPHAGEAL DILATION N/A 12/03/2023   Procedure: DILATION, ESOPHAGUS;  Surgeon: Cindie Carlin POUR, DO;  [facility];  Service: Endoscopy;  Laterality: N/A;   ESOPHAGOGASTRODUODENOSCOPY N/A 10/13/2015   Dr. Rivet: chronic  gastritis on path, no H.pylori. Empiric dilation    ESOPHAGOGASTRODUODENOSCOPY N/A 12/18/2016   Dr. Harvey: mild gastritis. BRAVO study revealed uncontrolled GERD. Dysphagia secondary to uncontrolled reflux   ESOPHAGOGASTRODUODENOSCOPY N/A 12/03/2023   Procedure: EGD (ESOPHAGOGASTRODUODENOSCOPY);  Surgeon: Cindie Carlin POUR, DO;  [facility];  Service: Endoscopy;  Laterality: N/A;   FOOT SURGERY Bilateral    nerve     INCISION AND DRAINAGE OF WOUND Left 10/30/2023   Procedure: IRRIGATION AND DEBRIDEMENT WOUND;  Surgeon: Arlinda Buster, MD;  [facility]: [facility];  Service: Orthopedics;  Laterality: Left;  LEFT HAND WOUND   LEFT HEART CATH AND CORONARY ANGIOGRAPHY N/A 12/29/2018   Procedure: LEFT HEART CATH AND CORONARY ANGIOGRAPHY;  Surgeon: Dann Candyce RAMAN, MD;  Location: [facility] INVASIVE CV LAB;  Service: Cardiovascular;  Laterality: N/A;   LOWER EXTREMITY ANGIOGRAPHY Right 05/04/2022   Procedure: Lower Extremity Angiography;  Surgeon: Lanis Fonda BRAVO, MD;  Location: [facility] INVASIVE CV LAB;  Service: Cardiovascular;  Laterality: Right;   LUNG BIOPSY     MASS EXCISION  Right 01/09/2013   Procedure: EXCISION OF NEOPLASM OF RIGHT  AXILLA  AND EXCISION OF NEOPLASM OF LEFT AXILLA;  Surgeon: Oneil DELENA Budge, MD;  Location: [facility];  Service: General;  Laterality: Right;  procedure end @ 08:23   MYRINGOTOMY WITH TUBE PLACEMENT Bilateral 04/28/2017   Procedure: BILATERAL MYRINGOTOMY WITH TUBE PLACEMENT;  Surgeon: Karis Clunes, MD;  Location: [facility];  Service: ENT;  Laterality: Bilateral;   PERIPHERAL VASCULAR BALLOON ANGIOPLASTY Right 05/04/2022   Procedure: PERIPHERAL VASCULAR BALLOON ANGIOPLASTY;  Surgeon: Lanis Fonda BRAVO, MD;  Location: [facility]VASIVE CV LAB;  Service: Cardiovascular;  Laterality: Right;  PT   PERIPHERAL VASCULAR INTERVENTION Right 05/04/2022   Procedure: PERIPHERAL VASCULAR INTERVENTION;  Surgeon: Lanis Fonda BRAVO, MD;  Location: [facility] INVASIVE CV LAB;  Service: Cardiovascular;  Laterality:  Right;  SFA   PERIPHERAL VASCULAR INTERVENTION Left 07/21/2022   Procedure: PERIPHERAL VASCULAR INTERVENTION;  Surgeon: Serene Gaile ORN, MD;  [facility];  Service: Cardiovascular;  Laterality: Left;   REVISION OF ARTERIOVENOUS GORETEX GRAFT Left 05/04/2018   Procedure: TRANSPOSITION OF CEPHALIC VEIN ARTERIOVENOUS FISTULA LEFT ARM;  Surgeon: Oris Krystal FALCON, MD;  Location: [facility] OR;  Service: Vascular;  Laterality: Left;   SAVORY DILATION N/A 12/18/2016   Procedure: SAVORY DILATION;  Surgeon: Harvey Margo CROME, MD;  [facility];  Service: Endoscopy;  Laterality: N/A;   TRANSMETATARSAL AMPUTATION Left 08/15/2022   Procedure: TRANSMETATARSAL AMPUTATION;  Surgeon: Silva Juliene SAUNDERS, DPM;  Location: [facility] OR;  Service: Podiatry;  Laterality: Left;     Current Meds  Medication Sig   acetaminophen  (TYLENOL ) 500 MG tablet Take 1,000 mg by mouth every 6 (six) hours as needed for mild pain (pain score 1-3).   amLODipine  (NORVASC ) 10 MG tablet Take 0.5 tablets (5 mg total) by mouth daily.   [START ON 12/22/2023] apixaban  (ELIQUIS ) 2.5 MG TABS tablet Take 1 tablet (2.5 mg total) by mouth 2 (two) times daily.   B Complex-C-Folic Acid  (RENA-VITE RX) 1 MG TABS Take 1 tablet by mouth daily.   busPIRone  (BUSPAR ) 7.5 MG tablet Take 1 tablet (7.5 mg total) by mouth 2 (two) times daily.   calcitRIOL  (ROCALTROL ) 0.5 MCG capsule Take 2 capsules (1 mcg total) by mouth every Monday, Wednesday, and Friday with hemodialysis.   CEFAZOLIN  SODIUM IV Inject into the vein every Monday, Wednesday, and Friday with hemodialysis.   cinacalcet  (SENSIPAR ) 30 MG tablet Take 3 tablets (90 mg total) by mouth every Monday, Wednesday, and Friday.   ezetimibe  (ZETIA ) 10 MG tablet Take 1 tablet (10 mg total) by mouth daily.   insulin  aspart (NOVOLOG ) 100 UNIT/ML injection Inject 0-6 Units into the skin 3 (three) times daily with meals. CBG 70 - 120: 0 units  CBG 121 - 150: 0 units  CBG 151 - 200: 1 unit  CBG 201-250: 2  units  CBG 251-300: 3 units  CBG 301-350: 4 units  CBG 351-400: 5 units  CBG > 400: Give 10 units and call MD   insulin  glargine, 2 Unit Dial , (TOUJEO  MAX SOLOSTAR) 300 UNIT/ML Solostar Pen Inject 30 Units into the skin daily. May need to slowly increase the dose depending upon your blood sugar, follow-up with PCP   metoprolol  succinate (TOPROL -XL) 50 MG 24 hr tablet TAKE ONE TABLET BY MOUTH ONCE DAILY WITH FOOD (TAKE AFTER DIALYSIS)   ondansetron  (ZOFRAN ) 4 MG tablet Take 4 mg by mouth every 8 (eight) hours as needed for nausea or vomiting.   pantoprazole  (PROTONIX ) 40  MG tablet Take 1 tablet (40 mg total) by mouth daily.   sertraline  (ZOLOFT ) 50 MG tablet Take one and a half tablets once daily (Patient taking differently: Take 75 mg by mouth daily. Take one and a half tablets once daily)   torsemide  (DEMADEX ) 100 MG tablet Take 1 tablet (100 mg total) by mouth every Tuesday, Thursday, Saturday, and Sunday. (Patient taking differently: Take 100 mg by mouth every Tuesday, Thursday, Saturday, and Sunday. Non-dialysis days)   [name]  500 MG chewable tablet Chew 1-2 tablets (500-1,000 mg total) by mouth See admin instructions. Take 1000mg  (2 tablets) by mouth with meals and 500mg  (1 tablet) with snacks     Allergies:   Ace inhibitors, Penicillins, Statins, and Albuterol    ROS:   Please see the history of present illness. All other systems reviewed and are negative.   Labs/Other Tests and Data Reviewed:    Recent Labs: 12/28/2022: B Natriuretic Peptide 2,982.0 11/01/2023: TSH 0.393 12/17/2023: ALT <5 12/18/2023: BUN 22; Creatinine, Ser 2.86; Hemoglobin 8.7; Magnesium  1.7; Platelets 305; Potassium 3.9; Sodium 136   Recent Lipid Panel Lab Results  Component Value Date/Time   CHOL 123 12/18/2022 04:13 AM   TRIG 105 12/18/2022 04:13 AM   HDL 41 12/18/2022 04:13 AM   CHOLHDL 3.0 12/18/2022 04:13 AM   LDLCALC 61 12/18/2022 04:13 AM   LDLCALC 109 (H) 11/12/2017 09:36 AM   LDLDIRECT 120.0  11/21/2020 02:02 PM    Wt Readings from Last 3 Encounters:  12/18/23 152 lb 8.9 oz (69.2 kg)  12/07/23 160 lb 4.4 oz (72.7 kg)  11/13/23 149 lb 14.6 oz (68 kg)     Objective:    Vital Signs:  There were no vitals taken for this visit.   VITAL SIGNS:  reviewed GEN:  no acute distress EYES:  sclerae anicteric, EOMI - Extraocular Movements Intact RESPIRATORY:  normal respiratory effort, symmetric expansion NEURO:  alert and oriented x 3, no obvious focal deficit PSYCH:  normal affect  ASSESSMENT & PLAN:    Acute GI bleeding S/p colonoscopy in 09/25 X 2 - diverticulosis and 2 polyps removed, with likely diverticular bleed as source and later, post polypectomy bleed noted and 1 clip placed S/p 3 U PRBC transfusion Last CBC reviewed - Hb improved to 8.7 Does not report any active bleeding such as melena or hematochezia Plan to restart Eliquis  at a lower dose from tomorrow Continue pantoprazole  40 mg QD Follow-up with GI in outpatient setting  Afib (HCC) Rate controlled with metoprolol  50 mg QD Was on Eliquis  5 mg twice daily, was recently reduced to 2.5 mg twice daily due to recurrent GI bleeding Followed by cardiology  ESRD on hemodialysis (HCC) - On hemodialysis MWF Follow-up CBC and BMP from dialysis center  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 The patient was provided an opportunity to ask questions, and all were answered. The patient agreed with the plan and demonstrated an understanding of the instructions.   The patient was advised to call back  or seek an in-person evaluation if the symptoms worsen or if the condition fails  to improve as anticipated.  The above assessment and management plan was discussed with the patient. The patient verbalized understanding of and has agreed to the management plan.   Medication Adjustments/Labs and Tests Ordered: Current medicines are reviewed at length with the patient today.  Concerns regarding medicines are outlined above.   Tests Ordered: No orders of the defined types were placed in this encounter.   Medication Changes: No orders of the defined types were placed in this encounter.    Note: This dictation was prepared with Dragon dictation along with smaller phrase technology. Similar sounding words can be transcribed inadequately or may not be corrected upon review. Any transcriptional errors that result from this process are unintentional.      Disposition:  Follow up  Signed, Suzzane MARLA Blanch, MD  12/21/2023 2:55 PM     [facility]

## 2023-12-21 NOTE — Assessment & Plan Note (Signed)
 Hb: 5.3 on 12/15/23 due to lower GI bleeding Held Eliquis  Had coloscopy with 1 clip placement 3 U PRBC transfusion done during recent hospitalization Iron transfusion and erythropoietin  according to HD center

## 2023-12-21 NOTE — Assessment & Plan Note (Signed)
 Being managed by Vascular surgery, will likely need amputation of the hand according to chart review Gets IV cefazolin  at HD center

## 2023-12-21 NOTE — Patient Instructions (Signed)
 Visit Information  Thank you for taking time to visit with me today. Please don't hesitate to contact me if I can be of assistance to you before our next scheduled telephone appointment.  Our next appointment is by telephone on 12/30/23 at 1300  Following is a copy of your care plan:   Goals Addressed             This Visit's Progress    [facility] Transitions of Care [facility]) Care Plan        Reviewed and or Updated with daughter on 12/21/23 with daughter as informant, not with patient,  completed.   Problems:  Recent Hospitalization for treatment of Abscess of Lt hand - ongoing treatment 12/21/23 GI Bleeding PCP HFU completed on 12/21/23 PCP with Telemedicine today. Infectious disease follow up 12/31/23 ongoing follow up with left hand wound daughter states healing well, home health  Goal:  Over the next 30 days, the patient will not experience hospital readmission  Interventions:  Transitions of Care:  Daughter states no unmet needs or new concerns for patient on this call.  Transportation to HD was arranged via [facility] to [place] per daughter.  Reviewed medications/allergies/functional status/follow up appointments/review of systems/DME/Wound Care/home health.  Daughter reporting this call: denies patient complaints of chest pain or shortness of breath, patient mildly confused.  Daughter reports overall improvement and that she is doing a lot better, participating with [facility] PT in home, less confusion, and doing more.  Doctor Visits  - discussed the importance of doctor visits' Reviewed follow up visits coming up with specialists.  Contacted Health RN/OT/PT - Hedda has been to see patient and ordered wound care supplies. (Ongoing) Wound care reviewed.   ID follow up 12/31/23, gets Antibiotics with HD MWF PT coming to see patient and patient is participating with PT, per daughter.  Contacted DME company for patient use of equipment  Patient daughter has put  in a call to see when the hospital bed will be delivered.  Resolved.  12/20/33 Patient has hospital bed and hoyer lift   Patient Self Care Activities: (Daughter Managing/assisting) Attend all scheduled provider appointments Call pharmacy for medication refills 3-7 days in advance of running out of medications Call provider office for new concerns or questions  Notify RN Care Manager of TOC call rescheduling needs Participate in Transition of Care Program/Attend TOC scheduled calls Take medications as prescribed   LUE Continue to keep the drain in her hand and do daily basic dressing changes . Use Vashe if possible. (Per prior notes).  Continue to monitor blood pressure and blood sugars per daughter assistance.  Weight monitored via HD on MWF.    Plan:  Next PCP appointment scheduled for: [id number] Infectious disease Appointment [phone]  Telephone follow up appointment with care management team member scheduled for: 12/30/23 at 1 pm.  Continue with plan of care as discussed and reviewed.  The patient (Or contact of daughter as informant) has been provided with contact information for the care management team and has been advised to call with any health-related questions or concerns.  The patient's daughter Augusto verbalized understanding with current POC.  The patient is directed to their insurance card regarding availability of benefits coverage   Daughter interested in [facility] [employer] - will follow during 30 day TOC program and refer            Patient verbalizes understanding of instructions and care plan provided today and agrees to  view in MyChart. Active MyChart status and patient understanding of how to access instructions and care plan via MyChart confirmed with patient.     Telephone follow up appointment with care management team member scheduled for: The patient has been provided with contact information for the care management team and  has been advised to call with any health related questions or concerns.   Please call the care guide team at [phone] if you need to cancel or reschedule your appointment.   Please call the [place]  National Suicide Prevention Lifeline: 7124159735 or TTY: [phone] TTY (909)[phone]) to talk to a trained counselor call 1-800-273-TALK (toll free, 24 hour hotline) call 911 if you are experiencing a Mental Health or Behavioral Health Crisis or need someone to talk to.  Richerd Fish, RN, BSN, CCM [facility], [facility] RN Care Manager Direct Dial : [phone]

## 2023-12-24 ENCOUNTER — Other Ambulatory Visit: Payer: Self-pay

## 2023-12-25 DIAGNOSIS — S88912A Complete traumatic amputation of left lower leg, level unspecified, initial encounter: Secondary | ICD-10-CM | POA: Diagnosis not present

## 2023-12-28 ENCOUNTER — Ambulatory Visit: Admitting: Orthopedic Surgery

## 2023-12-28 DIAGNOSIS — I96 Gangrene, not elsewhere classified: Secondary | ICD-10-CM

## 2023-12-28 MED ORDER — NITROGLYCERIN 0.2 MG/HR TD PT24: 0.2000 mg | MEDICATED_PATCH | Freq: Every day | TRANSDERMAL | Status: DC

## 2023-12-28 MED ORDER — NITROGLYCERIN 0.2 MG/HR TD PT24: 0.2000 mg | MEDICATED_PATCH | Freq: Every day | TRANSDERMAL | 12 refills | Status: DC

## 2023-12-28 NOTE — Progress Notes (Signed)
   Kathryn Beck - 68 y.o. female MRN [id number]  Date of birth: 01/21/1956  Office Visit Note: Visit Date: 12/28/2023 PCP: Kathryn Rollene BRAVO, MD Referred by: Kathryn Rollene BRAVO, MD  Subjective:  HPI: Kathryn Beck is a 68 y.o. female who presents today for follow up 8 weeks status post left hand and wrist irrigation and debridement for ongoing infection in the setting of prior ray resection for gangrenous digit.  Has been doing dressing changes once to twice a day as instructed with Vashe.  No significant active drainage, no significant systemic symptoms.  Is getting IV antibiotics regularly with dialysis.  Pertinent ROS were reviewed with the patient and found to be negative unless otherwise specified above in HPI.   Assessment & Plan: Visit Diagnoses:  1. 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  There is unclear if this is being temporized by the ongoing and IV antibiotics, there remains slow healing on examination today.  There remains an appearance of dry gangrenous tissue throughout the left hand, however at the wrist level dorsally there is some evidence of some granulation tissue.  I explained that we can continue to take a watch and wait approach to allow for ongoing healing, would recommend continuing with IV antibiotics for the time being.  Follow-up in approximate 1 month for repeat wound check.  Continue with dressing changes.    There is still does exist the possibility of forearm amputation should she continue to demonstrate ongoing gangrenous hand and wrist.  This will be done for source control to prevent systemic symptoms. Strict return precautions were discussed with patient and her daughter today.  Patient was seen today with Dr. Harden present as well who may be able to perform some fish skin graft should the granulation progress over the dorsal aspect of the  hand.  Follow-up: No follow-ups on file.   Meds & Orders:  Meds ordered this encounter  Medications   DISCONTD: nitroGLYCERIN  (NITRODUR - Dosed in mg/24 hr) patch 0.2 mg   nitroGLYCERIN  (NITRODUR - DOSED IN MG/24 HR) 0.2 mg/hr patch    Sig: Place 1 patch (0.2 mg total) onto the skin daily.    Dispense:  30 patch    Refill:  12   No orders of the defined types were placed in this encounter.    Procedures: No procedures performed       Objective:   Vital Signs: There were no vitals taken for this visit.  Ortho Exam Left upper extremity: -  longitudinal incisions to the dorsal aspect of the hand and wrist with sutures in place demonstrating slow healing, mild drainage is expressible in these regions, no significant erythema, dusky appearance to the dorsal aspect of the hand, minimal color is appreciated, poor sensation to the distal aspects of the digits, range of motion is limited secondary to pain at the wrist and hand level  Imaging: No results found.   Kathryn Beck, M.D. [facility], Hand Surgery

## 2023-12-29 ENCOUNTER — Ambulatory Visit: Admitting: Internal Medicine

## 2023-12-29 ENCOUNTER — Telehealth: Payer: Self-pay

## 2023-12-29 ENCOUNTER — Inpatient Hospital Stay (HOSPITAL_COMMUNITY)
Admission: EM | Admit: 2023-12-29 | Discharge: 2024-01-02 | DRG: 377 | Disposition: A | Discharge status: Home Health | Attending: Family Medicine | Admitting: Family Medicine

## 2023-12-29 ENCOUNTER — Other Ambulatory Visit: Payer: Self-pay

## 2023-12-29 ENCOUNTER — Encounter (HOSPITAL_COMMUNITY): Payer: Self-pay

## 2023-12-29 ENCOUNTER — Ambulatory Visit: Payer: Self-pay

## 2023-12-29 DIAGNOSIS — I10 Essential (primary) hypertension: Secondary | ICD-10-CM | POA: Diagnosis present

## 2023-12-29 DIAGNOSIS — D631 Anemia in chronic kidney disease: Secondary | ICD-10-CM | POA: Diagnosis present

## 2023-12-29 DIAGNOSIS — I480 Paroxysmal atrial fibrillation: Secondary | ICD-10-CM | POA: Diagnosis present

## 2023-12-29 DIAGNOSIS — F32A Depression, unspecified: Secondary | ICD-10-CM | POA: Diagnosis present

## 2023-12-29 DIAGNOSIS — Z794 Long term (current) use of insulin: Secondary | ICD-10-CM | POA: Diagnosis not present

## 2023-12-29 DIAGNOSIS — K219 Gastro-esophageal reflux disease without esophagitis: Secondary | ICD-10-CM | POA: Diagnosis present

## 2023-12-29 DIAGNOSIS — N186 End stage renal disease: Secondary | ICD-10-CM | POA: Diagnosis present

## 2023-12-29 DIAGNOSIS — R413 Other amnesia: Secondary | ICD-10-CM | POA: Diagnosis not present

## 2023-12-29 DIAGNOSIS — I959 Hypotension, unspecified: Secondary | ICD-10-CM | POA: Diagnosis present

## 2023-12-29 DIAGNOSIS — Z8673 Personal history of transient ischemic attack (TIA), and cerebral infarction without residual deficits: Secondary | ICD-10-CM

## 2023-12-29 DIAGNOSIS — K5731 Diverticulosis of large intestine without perforation or abscess with bleeding: Secondary | ICD-10-CM | POA: Diagnosis present

## 2023-12-29 DIAGNOSIS — D62 Acute posthemorrhagic anemia: Secondary | ICD-10-CM | POA: Diagnosis not present

## 2023-12-29 DIAGNOSIS — K254 Chronic or unspecified gastric ulcer with hemorrhage: Secondary | ICD-10-CM | POA: Diagnosis not present

## 2023-12-29 DIAGNOSIS — E1122 Type 2 diabetes mellitus with diabetic chronic kidney disease: Secondary | ICD-10-CM | POA: Diagnosis not present

## 2023-12-29 DIAGNOSIS — Z89511 Acquired absence of right leg below knee: Secondary | ICD-10-CM | POA: Diagnosis not present

## 2023-12-29 DIAGNOSIS — I96 Gangrene, not elsewhere classified: Secondary | ICD-10-CM | POA: Diagnosis present

## 2023-12-29 DIAGNOSIS — Z860101 Personal history of adenomatous and serrated colon polyps: Secondary | ICD-10-CM

## 2023-12-29 DIAGNOSIS — I5032 Chronic diastolic (congestive) heart failure: Secondary | ICD-10-CM | POA: Diagnosis present

## 2023-12-29 DIAGNOSIS — I4891 Unspecified atrial fibrillation: Secondary | ICD-10-CM | POA: Diagnosis not present

## 2023-12-29 DIAGNOSIS — I350 Nonrheumatic aortic (valve) stenosis: Secondary | ICD-10-CM | POA: Diagnosis not present

## 2023-12-29 DIAGNOSIS — K625 Hemorrhage of anus and rectum: Secondary | ICD-10-CM | POA: Diagnosis not present

## 2023-12-29 DIAGNOSIS — L02512 Cutaneous abscess of left hand: Secondary | ICD-10-CM | POA: Diagnosis present

## 2023-12-29 DIAGNOSIS — N250 Renal osteodystrophy: Secondary | ICD-10-CM | POA: Diagnosis not present

## 2023-12-29 DIAGNOSIS — Z8616 Personal history of COVID-19: Secondary | ICD-10-CM

## 2023-12-29 DIAGNOSIS — F411 Generalized anxiety disorder: Secondary | ICD-10-CM | POA: Diagnosis present

## 2023-12-29 DIAGNOSIS — I739 Peripheral vascular disease, unspecified: Secondary | ICD-10-CM | POA: Diagnosis present

## 2023-12-29 DIAGNOSIS — Z880 Allergy status to penicillin: Secondary | ICD-10-CM

## 2023-12-29 DIAGNOSIS — I132 Hypertensive heart and chronic kidney disease with heart failure and with stage 5 chronic kidney disease, or end stage renal disease: Secondary | ICD-10-CM | POA: Diagnosis present

## 2023-12-29 DIAGNOSIS — F321 Major depressive disorder, single episode, moderate: Secondary | ICD-10-CM | POA: Diagnosis not present

## 2023-12-29 DIAGNOSIS — L089 Local infection of the skin and subcutaneous tissue, unspecified: Secondary | ICD-10-CM | POA: Diagnosis not present

## 2023-12-29 DIAGNOSIS — Z7901 Long term (current) use of anticoagulants: Secondary | ICD-10-CM

## 2023-12-29 DIAGNOSIS — Z8261 Family history of arthritis: Secondary | ICD-10-CM

## 2023-12-29 DIAGNOSIS — K922 Gastrointestinal hemorrhage, unspecified: Secondary | ICD-10-CM | POA: Diagnosis not present

## 2023-12-29 DIAGNOSIS — E1169 Type 2 diabetes mellitus with other specified complication: Secondary | ICD-10-CM | POA: Diagnosis present

## 2023-12-29 DIAGNOSIS — Z888 Allergy status to other drugs, medicaments and biological substances status: Secondary | ICD-10-CM

## 2023-12-29 DIAGNOSIS — Z89512 Acquired absence of left leg below knee: Secondary | ICD-10-CM | POA: Diagnosis not present

## 2023-12-29 DIAGNOSIS — Z803 Family history of malignant neoplasm of breast: Secondary | ICD-10-CM

## 2023-12-29 DIAGNOSIS — E782 Mixed hyperlipidemia: Secondary | ICD-10-CM | POA: Diagnosis present

## 2023-12-29 DIAGNOSIS — E1165 Type 2 diabetes mellitus with hyperglycemia: Secondary | ICD-10-CM | POA: Diagnosis not present

## 2023-12-29 DIAGNOSIS — Z83438 Family history of other disorder of lipoprotein metabolism and other lipidemia: Secondary | ICD-10-CM

## 2023-12-29 DIAGNOSIS — Z992 Dependence on renal dialysis: Secondary | ICD-10-CM | POA: Diagnosis not present

## 2023-12-29 DIAGNOSIS — Z8249 Family history of ischemic heart disease and other diseases of the circulatory system: Secondary | ICD-10-CM

## 2023-12-29 DIAGNOSIS — E1152 Type 2 diabetes mellitus with diabetic peripheral angiopathy with gangrene: Secondary | ICD-10-CM | POA: Diagnosis not present

## 2023-12-29 DIAGNOSIS — K648 Other hemorrhoids: Secondary | ICD-10-CM | POA: Diagnosis not present

## 2023-12-29 DIAGNOSIS — W449XXA Unspecified foreign body entering into or through a natural orifice, initial encounter: Secondary | ICD-10-CM | POA: Diagnosis present

## 2023-12-29 DIAGNOSIS — Z79899 Other long term (current) drug therapy: Secondary | ICD-10-CM

## 2023-12-29 DIAGNOSIS — D649 Anemia, unspecified: Secondary | ICD-10-CM | POA: Diagnosis not present

## 2023-12-29 DIAGNOSIS — E669 Obesity, unspecified: Secondary | ICD-10-CM | POA: Diagnosis present

## 2023-12-29 DIAGNOSIS — R58 Hemorrhage, not elsewhere classified: Secondary | ICD-10-CM | POA: Diagnosis not present

## 2023-12-29 DIAGNOSIS — I120 Hypertensive chronic kidney disease with stage 5 chronic kidney disease or end stage renal disease: Secondary | ICD-10-CM | POA: Diagnosis not present

## 2023-12-29 DIAGNOSIS — G4733 Obstructive sleep apnea (adult) (pediatric): Secondary | ICD-10-CM | POA: Diagnosis not present

## 2023-12-29 DIAGNOSIS — Z7984 Long term (current) use of oral hypoglycemic drugs: Secondary | ICD-10-CM

## 2023-12-29 DIAGNOSIS — T184XXA Foreign body in colon, initial encounter: Secondary | ICD-10-CM | POA: Diagnosis present

## 2023-12-29 DIAGNOSIS — Z91199 Patient's noncompliance with other medical treatment and regimen due to unspecified reason: Secondary | ICD-10-CM

## 2023-12-29 LAB — CBC
HCT: 23.3 % — ABNORMAL LOW (ref 36.0–46.0)
Hemoglobin: 7 g/dL — ABNORMAL LOW (ref 12.0–15.0)
MCH: 26.4 pg (ref 26.0–34.0)
MCHC: 30 g/dL (ref 30.0–36.0)
MCV: 87.9 fL (ref 80.0–100.0)
Platelets: 308 K/uL (ref 150–400)
RBC: 2.65 MIL/uL — ABNORMAL LOW (ref 3.87–5.11)
RDW: 16.2 % — ABNORMAL HIGH (ref 11.5–15.5)
WBC: 8.1 K/uL (ref 4.0–10.5)
nRBC: 0.2 % (ref 0.0–0.2)

## 2023-12-29 LAB — BASIC METABOLIC PANEL WITH GFR
Anion gap: 12 (ref 5–15)
BUN: 22 mg/dL (ref 8–23)
CO2: 30 mmol/L (ref 22–32)
Calcium: 8.6 mg/dL — ABNORMAL LOW (ref 8.9–10.3)
Chloride: 102 mmol/L (ref 98–111)
Creatinine, Ser: 3.04 mg/dL — ABNORMAL HIGH (ref 0.44–1.00)
GFR, Estimated: 16 mL/min — ABNORMAL LOW (ref >60)
Glucose, Bld: 127 mg/dL — ABNORMAL HIGH (ref 70–99)
Potassium: 4 mmol/L (ref 3.5–5.1)
Sodium: 144 mmol/L (ref 135–145)

## 2023-12-29 LAB — CBG MONITORING, ED: Glucose-Capillary: 105 mg/dL — ABNORMAL HIGH (ref 70–99)

## 2023-12-29 MED ORDER — PANTOPRAZOLE SODIUM 40 MG PO TBEC
40.0000 mg | DELAYED_RELEASE_TABLET | Freq: Every day | ORAL | Status: DC
  Administered 2023-12-30 – 2024-01-02 (×3): 40 mg via ORAL
  Filled 2023-12-29 (×2): qty 1

## 2023-12-29 MED ORDER — ACETAMINOPHEN 325 MG PO TABS
650.0000 mg | ORAL_TABLET | Freq: Four times a day (QID) | ORAL | Status: DC | PRN
  Administered 2023-12-31 – 2024-01-02 (×3): 650 mg via ORAL
  Filled 2023-12-29 (×3): qty 2

## 2023-12-29 MED ORDER — SODIUM CHLORIDE 0.9% FLUSH
3.0000 mL | Freq: Two times a day (BID) | INTRAVENOUS | Status: DC
  Administered 2023-12-29 – 2024-01-02 (×7): 3 mL via INTRAVENOUS

## 2023-12-29 MED ORDER — ACETAMINOPHEN 650 MG RE SUPP: 650.0000 mg | Freq: Four times a day (QID) | RECTAL | Status: DC | PRN

## 2023-12-29 MED ORDER — INSULIN ASPART 100 UNIT/ML IJ SOLN
0.0000 [IU] | INTRAMUSCULAR | Status: DC
  Administered 2024-01-01: 2 [IU] via SUBCUTANEOUS
  Administered 2024-01-01 (×2): 3 [IU] via SUBCUTANEOUS
  Administered 2024-01-02: 1 [IU] via SUBCUTANEOUS

## 2023-12-29 MED ORDER — SERTRALINE HCL 50 MG PO TABS
75.0000 mg | ORAL_TABLET | Freq: Every day | ORAL | Status: DC
  Administered 2023-12-30 – 2024-01-02 (×3): 75 mg via ORAL
  Filled 2023-12-29 (×2): qty 1

## 2023-12-29 MED ORDER — PROCHLORPERAZINE EDISYLATE 10 MG/2ML IJ SOLN: 5.0000 mg | Freq: Four times a day (QID) | INTRAMUSCULAR | Status: DC | PRN

## 2023-12-29 MED ORDER — BUSPIRONE HCL 15 MG PO TABS
7.5000 mg | ORAL_TABLET | Freq: Two times a day (BID) | ORAL | Status: DC
  Administered 2023-12-30 – 2024-01-02 (×6): 7.5 mg via ORAL
  Filled 2023-12-29 (×5): qty 1

## 2023-12-29 MED ORDER — CEFAZOLIN SODIUM-DEXTROSE 2-4 GM/100ML-% IV SOLN
2.0000 g | INTRAVENOUS | Status: DC
  Administered 2023-12-31: 2 g via INTRAVENOUS
  Filled 2023-12-29: qty 100

## 2023-12-29 MED ORDER — METOPROLOL SUCCINATE ER 50 MG PO TB24
50.0000 mg | ORAL_TABLET | Freq: Every day | ORAL | Status: DC
  Administered 2023-12-30 – 2024-01-02 (×2): 50 mg via ORAL
  Filled 2023-12-29 (×2): qty 1

## 2023-12-29 NOTE — H&P (Signed)
 History and Physical    Kathryn Beck FMW:[id number] DOB: [birth year] DOA: 12/29/2023  PCP: Antonetta Rollene BRAVO, MD   Patient coming from: Home   Chief Complaint: Rectal bleeding  HPI: Kathryn Beck is a 68 y.o. female with medical history significant for hypertension, hyperlipidemia, type 2 diabetes mellitus, atrial fibrillation on Eliquis , PAD with bilateral BKA, memory loss, ESRD, finger gangrene status post I&D currently on cefazolin  with dialysis, and recent admissions for GI bleeding who presents to the emergency department with rectal bleeding.  Patient is accompanied by her husband and daughter who assisted with the history.  She was noted to have dark red blood in her stool this morning.  Her Eliquis  was held this morning in light of this.  She had dialysis today and following that, was noted to have dark red blood from her rectum without any stool.  The patient denies any pain, nausea, or vomiting.  She had been admitted to the hospital in September with GI bleeding that was suspected to be diverticular.  She had polypectomy performed and was readmitted with post polypectomy bleeding.  Her Eliquis  had been on hold but was resumed on 12/22/2023 at 2.5 mg twice daily (she was previously on 5 mg twice daily).  ED Course: Upon arrival to the ED, patient is found to be afebrile and saturating well on room air with normal HR and stable BP.  Labs are most notable for normal BUN, creatinine 3.04, normal WBC, and hemoglobin 7.0.  Type and screen was performed in the ED.  Review of Systems:  All other systems reviewed and apart from HPI, are negative.  Past Medical History:  Diagnosis Date   Acid reflux    Amputated left leg (HCC)    bilateral BKA   Anemia    Arthritis    Axillary masses    Soft tissue - status post excision   Back pain    CHF (congestive heart failure) (HCC)    COVID-19 virus infection 04/06/2019   Depression    End-stage renal disease (HCC)    M/W/F dialysis    Essential hypertension    Headache    years ago   History of blood transfusion    History of cardiac catheterization    Normal coronary arteries October 2020   History of claustrophobia    History of pneumonia 2019   Hypoxia 04/03/2019   Memory loss    Mixed hyperlipidemia    Obesity    Pancreatitis    Peritoneal dialysis catheter in place    Pneumonia due to COVID-19 virus 04/02/2019   Sleep apnea    Noncompliant with CPAP   Stroke (HCC)    mini stroke   Type 2 diabetes mellitus (HCC)     Past Surgical History:  Procedure Laterality Date   ABDOMINAL AORTOGRAM W/LOWER EXTREMITY N/A 04/30/2022   Procedure: ABDOMINAL AORTOGRAM W/LOWER EXTREMITY;  Surgeon: Magda Debby SAILOR, MD;  Location: [facility];  Service: Cardiovascular;  Laterality: N/A;   ABDOMINAL AORTOGRAM W/LOWER EXTREMITY N/A 07/21/2022   Procedure: ABDOMINAL AORTOGRAM W/LOWER EXTREMITY;  Surgeon: Serene Gaile ORN, MD;  Location: [facility];  Service: Cardiovascular;  Laterality: N/A;   ABDOMINAL HYSTERECTOMY     ACHILLES TENDON LENGTHENING  08/15/2022   Procedure: ACHILLES TENDON LENGTHENING;  Surgeon: Silva Juliene SAUNDERS, DPM;  Location: [facility] OR;  Service: Podiatry;;   AMPUTATION Right 05/29/2022   Procedure: RIGHT BELOW THE KNEE AMPUTATION;  Surgeon: Harden Jerona GAILS, MD;  Location: [facility] OR;  Service: Orthopedics;  Laterality: Right;   AMPUTATION Left 09/04/2022   Procedure: AMPUTATION FOOT, serial irrigation;  Surgeon: Joya Stabs, DPM;  Location: [facility] OR;  Service: Podiatry;  Laterality: Left;  Surgical team to do block   AMPUTATION Left 10/07/2022   Procedure: LEFT BELOW KNEE AMPUTATION;  Surgeon: Harden Jerona GAILS, MD;  Location: [facility] OR;  Service: Orthopedics;  Laterality: Left;   AMPUTATION FINGER Left 09/29/2023   Procedure: AMPUTATION, FINGER;  Surgeon: Harden Jerona GAILS, MD;  Location: [facility] OR;  Service: Orthopedics;  Laterality: Left;  LEFT HAND LONG FINGER RAY AMPUTATION   AV FISTULA PLACEMENT Left 09/02/2017    Procedure: creation of left arm ARTERIOVENOUS (AV) FISTULA;  Surgeon: Serene Gaile ORN, MD;  Location: [facility] OR;  Service: Vascular;  Laterality: Left;   COLONOSCOPY  2008   Dr. Harvey: normal    COLONOSCOPY N/A 12/18/2016   Dr. Harvey: multiple tubular adenomas, internal hemorrhoids. Surveillance in 3 years    COLONOSCOPY N/A 12/05/2023   Procedure: COLONOSCOPY;  Surgeon: Cindie Carlin POUR, DO;  Location: [facility];  Service: Endoscopy;  Laterality: N/A;   COLONOSCOPY N/A 12/17/2023   Procedure: COLONOSCOPY;  Surgeon: Shaaron Lamar HERO, MD;  Location: [facility];  Service: Endoscopy;  Laterality: N/A;   ESOPHAGEAL DILATION N/A 10/13/2015   Procedure: ESOPHAGEAL DILATION;  Surgeon: Claudis RAYMOND Rivet, MD;  Location: [facility];  Service: Endoscopy;  Laterality: N/A;   ESOPHAGEAL DILATION N/A 12/03/2023   Procedure: DILATION, ESOPHAGUS;  Surgeon: Cindie Carlin POUR, DO;  Location: [facility];  Service: Endoscopy;  Laterality: N/A;   ESOPHAGOGASTRODUODENOSCOPY N/A 10/13/2015   Dr. Rivet: chronic gastritis on path, no H.pylori. Empiric dilation    ESOPHAGOGASTRODUODENOSCOPY N/A 12/18/2016   Dr. Harvey: mild gastritis. BRAVO study revealed uncontrolled GERD. Dysphagia secondary to uncontrolled reflux   ESOPHAGOGASTRODUODENOSCOPY N/A 12/03/2023   Procedure: EGD (ESOPHAGOGASTRODUODENOSCOPY);  Surgeon: Cindie Carlin POUR, DO;  Location: [facility];  Service: Endoscopy;  Laterality: N/A;   FOOT SURGERY Bilateral    nerve     INCISION AND DRAINAGE OF WOUND Left 10/30/2023   Procedure: IRRIGATION AND DEBRIDEMENT WOUND;  Surgeon: Arlinda Buster, MD;  Location: [facility];  Service: Orthopedics;  Laterality: Left;  LEFT HAND WOUND   LEFT HEART CATH AND CORONARY ANGIOGRAPHY N/A 12/29/2018   Procedure: LEFT HEART CATH AND CORONARY ANGIOGRAPHY;  Surgeon: Dann Candyce RAMAN, MD;  Location: [facility] INVASIVE CV LAB;  Service: Cardiovascular;  Laterality: N/A;   LOWER EXTREMITY ANGIOGRAPHY Right 05/04/2022    Procedure: Lower Extremity Angiography;  Surgeon: Lanis Fonda BRAVO, MD;  Location: [facility] INV[facility] CV LAB;  Service: Cardiovascular;  Laterality: Right;   LUNG BIOPSY     MASS EXCISION Right 01/09/2013   Procedure: EXCISION OF NEOPLASM OF RIGHT  AXILLA  AND EXCISION OF NEOPLASM OF LEFT AXILLA;  Surgeon: Oneil DELENA Budge, MD;  Location: [facility];  Service: General;  Laterality: Right;  procedure end @ 08:23   MYRINGOTOMY WITH TUBE PLACEMENT Bilateral 04/28/2017   Procedure: BILATERAL MYRINGOTOMY WITH TUBE PLACEMENT;  Surgeon: Karis Clunes, MD;  Location: [facility] OR;  Service: ENT;  Laterality: Bilateral;   PERIPHERAL VASCULAR BALLOON ANGIOPLASTY Right 05/04/2022   Procedure: PERIPHERAL VASCULAR BALLOON ANGIOPLASTY;  Surgeon: Lanis Fonda BRAVO, MD;  Location: [facility] INVASIVE CV LAB;  Service: Cardiovascular;  Laterality: Right;  PT   PERIPHERAL VASCULAR INTERVENTION Right 05/04/2022   Procedure: PERIPHERAL VASCULAR INTERVENTION;  Surgeon: Lanis Fonda BRAVO, MD;  Location: [facility] INVASIVE CV LAB;  Service: Cardiovascular;  Laterality:  Right;  SFA   PERIPHERAL VASCULAR INTERVENTION Left 07/21/2022   Procedure: PERIPHERAL VASCULAR INTERVENTION;  Surgeon: Serene Gaile ORN, MD;  Location: [facility];  Service: Cardiovascular;  Laterality: Left;   REVISION OF ARTERIOVENOUS GORETEX GRAFT Left 05/04/2018   Procedure: TRANSPOSITION OF CEPHALIC VEIN ARTERIOVENOUS FISTULA LEFT ARM;  Surgeon: Oris Krystal FALCON, MD;  Location: [facility] OR;  Service: Vascular;  Laterality: Left;   SAVORY DILATION N/A 12/18/2016   Procedure: SAVORY DILATION;  Surgeon: Harvey Margo CROME, MD;  [facility];  Service: Endoscopy;  Laterality: N/A;   TRANSMETATARSAL AMPUTATION Left 08/15/2022   Procedure: TRANSMETATARSAL AMPUTATION;  Surgeon: Silva Juliene SAUNDERS, DPM;  Location: [facility] OR;  Service: Podiatry;  Laterality: Left;    Social History:   reports that she has never smoked. She has never been exposed to tobacco smoke. She has never used smokeless tobacco. She  reports that she does not drink alcohol and does not use drugs.  Allergies  Allergen Reactions   Ace Inhibitors Anaphylaxis and Swelling   Penicillins Itching and Swelling    Has tolerated cefazolin  on multiple occasions    Statins Other (See Comments)    Elevated LFT's   Albuterol  Swelling    Family History  Problem Relation Age of Onset   Hypertension Father    Hypercholesterolemia Father    Arthritis Father    Hypertension Sister    Hypercholesterolemia Sister    Breast cancer Sister    Hypertension Sister    Colon cancer Neg Hx    Colon polyps Neg Hx      Prior to Admission medications   Medication Sig Start Date End Date Taking? Authorizing Provider  acetaminophen  (TYLENOL ) 500 MG tablet Take 1,000 mg by mouth every 6 (six) hours as needed for mild pain (pain score 1-3).    [provider]  amLODipine  (NORVASC ) 10 MG tablet Take 0.5 tablets (5 mg total) by mouth daily. 10/28/23   Pearlean Manus, MD  apixaban  (ELIQUIS ) 2.5 MG TABS tablet Take 1 tablet (2.5 mg total) by mouth 2 (two) times daily. 12/22/23   Maree, Pratik D, DO  B Complex-C-Folic Acid  (RENA-VITE RX) 1 MG TABS Take 1 tablet by mouth daily. 04/15/22   [provider]  busPIRone  (BUSPAR ) 7.5 MG tablet Take 1 tablet (7.5 mg total) by mouth 2 (two) times daily. 11/24/23   Antonetta Rollene BRAVO, MD  calcitRIOL  (ROCALTROL ) 0.5 MCG capsule Take 2 capsules (1 mcg total) by mouth every Monday, Wednesday, and Friday with hemodialysis. 11/05/23   Caleen Burgess BROCKS, MD  CEFAZOLIN  SODIUM IV Inject into the vein every Monday, Wednesday, and Friday with hemodialysis. 11/30/23 01/11/24  [provider]  cinacalcet  (SENSIPAR ) 30 MG tablet Take 3 tablets (90 mg total) by mouth every Monday, Wednesday, and Friday. 12/08/23   Johnson, Clanford L, MD  ezetimibe  (ZETIA ) 10 MG tablet Take 1 tablet (10 mg total) by mouth daily. 04/27/23   Antonetta Rollene BRAVO, MD  insulin  aspart (NOVOLOG ) 100 UNIT/ML injection Inject 0-6 Units  into the skin 3 (three) times daily with meals. CBG 70 - 120: 0 units  CBG 121 - 150: 0 units  CBG 151 - 200: 1 unit  CBG 201-250: 2 units  CBG 251-300: 3 units  CBG 301-350: 4 units  CBG 351-400: 5 units  CBG > 400: Give 10 units and call MD 11/12/23   Shahmehdi, Seyed A, MD  insulin  glargine, 2 Unit Dial , (TOUJEO  MAX SOLOSTAR) 300 UNIT/ML Solostar Pen Inject 30  Units into the skin daily. May need to slowly increase the dose depending upon your blood sugar, follow-up with PCP 01/01/23   Trixie File, MD  metoprolol  succinate (TOPROL -XL) 50 MG 24 hr tablet TAKE ONE TABLET BY MOUTH ONCE DAILY WITH FOOD (TAKE AFTER DIALYSIS) 12/20/23   Antonetta Rollene BRAVO, MD  nitroGLYCERIN  (NITRODUR - DOSED IN MG/24 HR) 0.2 mg/hr patch Place 1 patch (0.2 mg total) onto the skin daily. 12/28/23   Agarwala, Anshul, MD  ondansetron  (ZOFRAN ) 4 MG tablet Take 4 mg by mouth every 8 (eight) hours as needed for nausea or vomiting. 11/26/23   [provider]  pantoprazole  (PROTONIX ) 40 MG tablet Take 1 tablet (40 mg total) by mouth daily. 12/08/23   Johnson, Clanford L, MD  sertraline  (ZOLOFT ) 50 MG tablet Take one and a half tablets once daily Patient taking differently: Take 75 mg by mouth daily. Take one and a half tablets once daily 11/24/23   Antonetta Rollene BRAVO, MD  torsemide  (DEMADEX ) 100 MG tablet Take 1 tablet (100 mg total) by mouth every Tuesday, Thursday, Saturday, and Sunday. Patient taking differently: Take 100 mg by mouth every Tuesday, Thursday, Saturday, and Sunday. Non-dialysis days 10/28/23   Pearlean Manus, MD  [name]  500 MG chewable tablet Chew 1-2 tablets (500-1,000 mg total) by mouth See admin instructions. Take 1000mg  (2 tablets) by mouth with meals and 500mg  (1 tablet) with snacks 08/23/23   Antonetta Rollene BRAVO, MD  FLUoxetine (PROZAC) 10 MG capsule Take 10 mg by mouth daily.    05/28/11  [provider]  glipiZIDE  (GLUCOTROL ) 10 MG tablet Take 10 mg by mouth 2 (two) times daily before  a meal.    05/28/11  [provider]    Physical Exam: Vitals:   12/29/23 1753 12/29/23 1754 12/29/23 2000  BP: (!) 133/56  (!) 140/66  Pulse: 62  60  Resp: 16  17  Temp: 98.2 F (36.8 C)    TempSrc: Oral    SpO2: 97%  99%  Weight:  69.2 kg   Height:  5' 4 (1.626 m)     Constitutional: NAD, no pallor or diaphoresis   Eyes: PERTLA, lids and conjunctivae normal ENMT: Mucous membranes are moist. Posterior pharynx clear of any exudate or lesions.   Neck: supple, no masses  Respiratory: no wheezing, no crackles. No accessory muscle use.  Cardiovascular: S1 & S2 heard, regular rate and rhythm. No extremity edema.  Abdomen: No tenderness, soft. Bowel sounds active.  Musculoskeletal: no clubbing / cyanosis. S/p BKAs.  Skin: Left hand ulcerations partially visualized. Warm, dry, well-perfused. Neurologic: CN 2-12 grossly intact. Moving all extremities. Alert and oriented to person and place.  Psychiatric: Calm. Cooperative.    Labs and Imaging on Admission: I have personally reviewed following labs and imaging studies  CBC: Recent Labs  Lab 12/29/23 1842  WBC 8.1  HGB 7.0*  HCT 23.3*  MCV 87.9  PLT 308   Basic Metabolic Panel: Recent Labs  Lab 12/29/23 1842  NA 144  K 4.0  CL 102  CO2 30  GLUCOSE 127*  BUN 22  CREATININE 3.04*  CALCIUM  8.6*   GFR: Estimated Creatinine Clearance: 17.2 mL/min (A) (by C-G formula based on SCr of 3.04 mg/dL (H)). Liver Function Tests: No results for input(s): AST, ALT, ALKPHOS, BILITOT, PROT, ALBUMIN  in the last 168 hours. No results for input(s): LIPASE, AMYLASE in the last 168 hours. No results for input(s): AMMONIA in the last 168 hours. Coagulation Profile: No results for  input(s): INR, PROTIME in the last 168 hours. Cardiac Enzymes: No results for input(s): CKTOTAL, CKMB, CKMBINDEX, TROPONINI in the last 168 hours. BNP (last 3 results) No results for input(s): PROBNP in the last 8760  hours. HbA1C: No results for input(s): HGBA1C in the last 72 hours. CBG: No results for input(s): GLUCAP in the last 168 hours. Lipid Profile: No results for input(s): CHOL, HDL, LDLCALC, TRIG, CHOLHDL, LDLDIRECT in the last 72 hours. Thyroid  Function Tests: No results for input(s): TSH, T4TOTAL, FREET4, T3FREE, THYROIDAB in the last 72 hours. Anemia Panel: No results for input(s): VITAMINB12, FOLATE, FERRITIN, TIBC, IRON, RETICCTPCT in the last 72 hours. Urine analysis:    Component Value Date/Time   COLORURINE AMBER (A) 11/10/2023 1101   APPEARANCEUR TURBID (A) 11/10/2023 1101   LABSPEC 1.017 11/10/2023 1101   PHURINE 5.0 11/10/2023 1101   GLUCOSEU NEGATIVE 11/10/2023 1101   HGBUR LARGE (A) 11/10/2023 1101   BILIRUBINUR NEGATIVE 11/10/2023 1101   BILIRUBINUR neg 02/15/2017 0913   KETONESUR NEGATIVE 11/10/2023 1101   PROTEINUR 100 (A) 11/10/2023 1101   UROBILINOGEN 0.2 02/15/2017 0913   NITRITE NEGATIVE 11/10/2023 1101   LEUKOCYTESUR MODERATE (A) 11/10/2023 1101   Sepsis Labs: @LABRCNTIP (procalcitonin:4,lacticidven:4) )No results found for this or any previous visit (from the past 240 hours).   Radiological Exams on Admission: No results found.   Assessment/Plan   1. Painless hematochezia; acute on chronic anemia  - Presents with painless hematochezia one week after resuming Eliquis   - She is hemodynamically stable; initial Hgb 7.0, down from 8.7 on September 27th  - Hold Eliquis  (last dose was PM of 10/7), trend H&H, transfuse as needed, bowel rest for now, consult GI  2. ESRD  - Completed HD today  - Hold diuretic given bleeding and current NPO status, renally-dose medications    3. Left hand infection  - Followed by ortho  - Continue cefazolin  with dialysis    4. Type II DM  - A1c was 6.7% in August 2025  - Check CBGs and use low-intensity SSI   5. HTN  - Hold Norvasc  for now in light of GI bleeding   6. Atrial  fibrillation  - Hold Eliquis , continue metoprolol  as tolerated    7. Depression, anxiety  - Zoloft , Buspar    8. OSA  - Encourage CPAP while sleeping   9. Memory loss  - Use delirium precautions    DVT prophylaxis: SCDs  Code Status: Full  Level of Care: Level of care: Telemetry Family Communication: Husband and daughter updated in ED Disposition Plan:  Patient is from: Home  Anticipated d/c is to: TBD Anticipated d/c date is: TBD Patient currently: Pending stable H&H  Consults called: Routine GI consultation requested via Epic order  Admission status: Observation     Kathryn Beck Sprinkles, MD [facility]  12/29/2023, 9:18 PM

## 2023-12-29 NOTE — Telephone Encounter (Signed)
 Noted

## 2023-12-29 NOTE — ED Provider Notes (Signed)
 [facility] EMERGENCY DEPARTMENT AT [facility] Provider Note   CSN: [id number] Arrival date & time: 12/29/23  1745     Patient presents with: Hematochezia   Kathryn Beck is a 68 y.o. female with history of A-fib on Eliquis , peripheral arterial disease status post right BKA in 2024), hypertension, hyperlipidemia, obesity, type 2 diabetes, bilateral adrenal adenomas, chronic heart failure, aortic stenosis, end-stage renal disease on Monday Wednesday Friday dialysis, TIAs, recurrent chest pain, presented to ED with concern for dark stools and blood in stool.  I did review external records including hospitalization in September of this year, 2 weeks ago, with concern for gangrene of the fingers, debrided in the hospital and receiving IV antibiotics regularly with dialysis.  Patient was also found to have acute blood loss anemia which was symptomatic with and required 3 units of red blood cell transfusion and underwent a colonoscopy with GI on 926 with findings of postpolypectomy bleed and 1 clip placed.  She was recommended to resume anticoagulation on October 1, and did so.  Hgb 5.3 -> 8.7 on discharge on 12/18/23 during the last check.      {Add pertinent medical, surgical, social history, OB history to HPI:32947} HPI     Prior to Admission medications   Medication Sig Start Date End Date Taking? Authorizing Provider  acetaminophen  (TYLENOL ) 500 MG tablet Take 1,000 mg by mouth every 6 (six) hours as needed for mild pain (pain score 1-3).    [provider]  amLODipine  (NORVASC ) 10 MG tablet Take 0.5 tablets (5 mg total) by mouth daily. 10/28/23   Pearlean Manus, MD  apixaban  (ELIQUIS ) 2.5 MG TABS tablet Take 1 tablet (2.5 mg total) by mouth 2 (two) times daily. 12/22/23   Maree, Pratik D, DO  B Complex-C-Folic Acid  (RENA-VITE RX) 1 MG TABS Take 1 tablet by mouth daily. 04/15/22   [provider]  busPIRone  (BUSPAR ) 7.5 MG tablet Take 1 tablet (7.5 mg total) by  mouth 2 (two) times daily. 11/24/23   Antonetta Rollene BRAVO, MD  calcitRIOL  (ROCALTROL ) 0.5 MCG capsule Take 2 capsules (1 mcg total) by mouth every Monday, Wednesday, and Friday with hemodialysis. 11/05/23   Caleen Burgess BROCKS, MD  CEFAZOLIN  SODIUM IV Inject into the vein every Monday, Wednesday, and Friday with hemodialysis. 11/30/23 01/11/24  [provider]  cinacalcet  (SENSIPAR ) 30 MG tablet Take 3 tablets (90 mg total) by mouth every Monday, Wednesday, and Friday. 12/08/23   Johnson, Clanford L, MD  ezetimibe  (ZETIA ) 10 MG tablet Take 1 tablet (10 mg total) by mouth daily. 04/27/23   Antonetta Rollene BRAVO, MD  insulin  aspart (NOVOLOG ) 100 UNIT/ML injection Inject 0-6 Units into the skin 3 (three) times daily with meals. CBG 70 - 120: 0 units  CBG 121 - 150: 0 units  CBG 151 - 200: 1 unit  CBG 201-250: 2 units  CBG 251-300: 3 units  CBG 301-350: 4 units  CBG 351-400: 5 units  CBG > 400: Give 10 units and call MD 11/12/23   Shahmehdi, Seyed A, MD  insulin  glargine, 2 Unit Dial , (TOUJEO  MAX SOLOSTAR) 300 UNIT/ML Solostar Pen Inject 30 Units into the skin daily. May need to slowly increase the dose depending upon your blood sugar, follow-up with PCP 01/01/23   Trixie File, MD  metoprolol  succinate (TOPROL -XL) 50 MG 24 hr tablet TAKE ONE TABLET BY MOUTH ONCE DAILY WITH FOOD (TAKE AFTER DIALYSIS) 12/20/23   Antonetta Rollene BRAVO, MD  nitroGLYCERIN  (NITRODUR - DOSED IN MG/24  HR) 0.2 mg/hr patch Place 1 patch (0.2 mg total) onto the skin daily. 12/28/23   Agarwala, Anshul, MD  ondansetron  (ZOFRAN ) 4 MG tablet Take 4 mg by mouth every 8 (eight) hours as needed for nausea or vomiting. 11/26/23   [provider]  pantoprazole  (PROTONIX ) 40 MG tablet Take 1 tablet (40 mg total) by mouth daily. 12/08/23   Johnson, Clanford L, MD  sertraline  (ZOLOFT ) 50 MG tablet Take one and a half tablets once daily Patient taking differently: Take 75 mg by mouth daily. Take one and a half tablets once daily 11/24/23    Antonetta Rollene BRAVO, MD  torsemide  (DEMADEX ) 100 MG tablet Take 1 tablet (100 mg total) by mouth every Tuesday, Thursday, Saturday, and Sunday. Patient taking differently: Take 100 mg by mouth every Tuesday, Thursday, Saturday, and Sunday. Non-dialysis days 10/28/23   Pearlean Manus, MD  [name]  500 MG chewable tablet Chew 1-2 tablets (500-1,000 mg total) by mouth See admin instructions. Take 1000mg  (2 tablets) by mouth with meals and 500mg  (1 tablet) with snacks 08/23/23   Antonetta Rollene BRAVO, MD  FLUoxetine (PROZAC) 10 MG capsule Take 10 mg by mouth daily.    05/28/11  [provider]  glipiZIDE  (GLUCOTROL ) 10 MG tablet Take 10 mg by mouth 2 (two) times daily before a meal.    05/28/11  [provider]    Allergies: Ace inhibitors, Penicillins, Statins, and Albuterol     Review of Systems  Updated Vital Signs BP (!) 133/56   Pulse 62   Temp 98.2 F (36.8 C) (Oral)   Resp 16   Ht 5' 4 (1.626 m)   Wt 69.2 kg   SpO2 97%   BMI 26.19 kg/m   Physical Exam Constitutional:      General: She is not in acute distress. HENT:     Head: Normocephalic and atraumatic.  Eyes:     Conjunctiva/sclera: Conjunctivae normal.     Pupils: Pupils are equal, round, and reactive to light.  Cardiovascular:     Rate and Rhythm: Normal rate and regular rhythm.  Pulmonary:     Effort: Pulmonary effort is normal. No respiratory distress.  Abdominal:     General: There is no distension.     Tenderness: There is no abdominal tenderness.  Musculoskeletal:     Comments: Chronic wound of the fingers noted  Skin:    General: Skin is warm and dry.  Neurological:     General: No focal deficit present.     Mental Status: She is alert. Mental status is at baseline.  Psychiatric:        Mood and Affect: Mood normal.        Behavior: Behavior normal.     (all labs ordered are listed, but only abnormal results are displayed) Labs Reviewed - No data to display  EKG: None  Radiology: No  results found.  {Document cardiac monitor, telemetry assessment procedure when appropriate:32947} Procedures   Medications Ordered in the ED - No data to display    {Click here for ABCD2, HEART and other calculators REFRESH Note before signing:1}                              Medical Decision Making Bloody stool This patient presents to the ED with concern for bloody stool. This involves an extensive number of treatment options, and is a complaint that carries with it a high risk of complications and morbidity.  The differential diagnosis includes recurring GI bleed in the setting of polypectomy, and resumption of Eliquis , most likely, versus other source of GI bleeding  Co-morbidities that complicate the patient evaluation: Eliquis  use, risk of bleeding  Additional history obtained from EMS  External records from outside source obtained and reviewed including hospital discharge records most recent hospitalization for GI bleed and anemia  I ordered and personally interpreted labs.  The pertinent results include:  ***  I ordered imaging studies including *** I independently visualized and interpreted imaging which showed *** I agree with the radiologist interpretation  The patient was maintained on a cardiac monitor.  I personally viewed and interpreted the cardiac monitored which showed an underlying rhythm of: ***  Per my interpretation the patient's ECG shows ***  I ordered medication including ***  for *** I have reviewed the patients home medicines and have made adjustments as needed  Test Considered: ***  I requested consultation with the ***,  and discussed lab and imaging findings as well as pertinent plan - they recommend: ***  After the interventions noted above, I reevaluated the patient and found that they have: {resolved/improved/worsened:23923::improved}  Social Determinants of Health:***  Dispostion:  After consideration of the diagnostic results and the  patients response to treatment, I feel that the patent would benefit from ***.   {Document critical care time when appropriate  Document review of labs and clinical decision tools ie CHADS2VASC2, etc  Document your independent review of radiology images and any outside records  Document your discussion with family members, caretakers and with consultants  Document social determinants of health affecting pt's care  Document your decision making why or why not admission, treatments were needed:32947:::1}   Final diagnoses:  None    ED Discharge Orders     None

## 2023-12-29 NOTE — ED Triage Notes (Signed)
 Pt arrived via [facility] from home for recurrent rectal bleeding. Pt LUE restricted and is M,W,F Dialysis Pt. Pt endorses intermittent watery, bloody stool. Pt reports recent colonoscopy and internal hemorrhoids.

## 2023-12-29 NOTE — Telephone Encounter (Signed)
 FYIBETHA Norris from [facility] phoned advising the pt's daughter had let her know that the pt is still having some bleeding after 3 trips to the ED. Norris was wanting advise as to what to tell the pt's daughter. I advised her to go to the ED to be seen because we didn't have any preventative measures here to stop the bleeding. She agreed that she would advise the daughter to take her mother to the ED for further evaluation.

## 2023-12-29 NOTE — Telephone Encounter (Signed)
 FYI Only or Action Required?: FYI only for provider.  Patient was last seen in primary care on 12/21/2023 by Tobie Suzzane POUR, MD.  Called Nurse Triage reporting Bleeding/Bruising.  Symptoms began today.  Interventions attempted: Other: held eliquis .  Symptoms are: gradually worsening.  Triage Disposition: Go to ED Now (or PCP Triage)  Patient/caregiver understands and will follow disposition?: Yes     Copied from CRM #[id number]. Topic: Clinical - Red Word Triage >> Dec 29, 2023  3:33 PM Wess RAMAN wrote: Red Word that prompted transfer to Nurse Triage: Patient's daughter, Augusto, stated patient was recently in the hospital for bleeeding. She has been bleeding since she started apixaban  (ELIQUIS ) 2.5 MG TABS tablet Reason for Disposition  Taking Coumadin (warfarin) or other strong blood thinner, or known bleeding disorder (e.g., thrombocytopenia)  Answer Assessment - Initial Assessment Questions Additional info:  1) Dialysis today-she feels really good.  2) recently discharged from hospital for rectal bleed. Restarted eliquis  8 days ago. This morning noticed dark blood in stool, family held her morning Eliquis , then after dialysis about 1.5 hours ago had dark red blood in her brief without bowel movement.   1. APPEARANCE of BLOOD: What color is it? Is it passed separately, on the surface of the stool, or mixed in with the stool?      Dark red blood on brief 2. AMOUNT: How much blood was passed?      small 3. FREQUENCY: How many times has blood been passed with the stools?      2 4. ONSET: When was the blood first seen in the stools? (Days or weeks)      This morning and 1.5 hours ago  5. DIARRHEA: Is there also some diarrhea? If Yes, ask: How many diarrhea stools in the past 24 hours?      denies 6. CONSTIPATION: Do you have constipation? If Yes, ask: How bad is it?     denies 7. RECURRENT SYMPTOMS: Have you had blood in your stools before? If Yes, ask: When  was the last time? and What happened that time?      yes 8. BLOOD THINNERS: Do you take any blood thinners? (e.g., aspirin , clopidogrel  / Plavix , coumadin, heparin ). Notes: Other strong blood thinners include: Arixtra (fondaparinux), Eliquis  (apixaban ), Pradaxa (dabigatran), and Xarelto (rivaroxaban).     Eliquis   9. OTHER SYMPTOMS: Do you have any other symptoms?  (e.g., abdomen pain, vomiting, dizziness, fever)     Denies. Vital signs at baseline per daughter.  10. PREGNANCY: Is there any chance you are pregnant? When was your last menstrual period?  Protocols used: Rectal Bleeding-A-AH

## 2023-12-29 NOTE — Telephone Encounter (Signed)
 Vernell NP, with [facility] would like a call back concerning  dosing and how long for medication.  CB# [phone].  Please advise.  Thank you.

## 2023-12-30 ENCOUNTER — Ambulatory Visit (HOSPITAL_BASED_OUTPATIENT_CLINIC_OR_DEPARTMENT_OTHER): Admitting: Pulmonary Disease

## 2023-12-30 ENCOUNTER — Telehealth: Payer: Self-pay

## 2023-12-30 ENCOUNTER — Encounter: Payer: Self-pay | Admitting: Family

## 2023-12-30 ENCOUNTER — Encounter (HOSPITAL_COMMUNITY): Payer: Self-pay | Admitting: Family Medicine

## 2023-12-30 DIAGNOSIS — N250 Renal osteodystrophy: Secondary | ICD-10-CM | POA: Diagnosis not present

## 2023-12-30 DIAGNOSIS — K625 Hemorrhage of anus and rectum: Secondary | ICD-10-CM | POA: Diagnosis not present

## 2023-12-30 DIAGNOSIS — K922 Gastrointestinal hemorrhage, unspecified: Secondary | ICD-10-CM | POA: Diagnosis not present

## 2023-12-30 DIAGNOSIS — I120 Hypertensive chronic kidney disease with stage 5 chronic kidney disease or end stage renal disease: Secondary | ICD-10-CM | POA: Diagnosis not present

## 2023-12-30 DIAGNOSIS — D62 Acute posthemorrhagic anemia: Secondary | ICD-10-CM | POA: Diagnosis not present

## 2023-12-30 DIAGNOSIS — Z992 Dependence on renal dialysis: Secondary | ICD-10-CM | POA: Diagnosis not present

## 2023-12-30 DIAGNOSIS — I96 Gangrene, not elsewhere classified: Secondary | ICD-10-CM | POA: Diagnosis not present

## 2023-12-30 DIAGNOSIS — N186 End stage renal disease: Secondary | ICD-10-CM | POA: Diagnosis not present

## 2023-12-30 LAB — BASIC METABOLIC PANEL WITH GFR
Anion gap: 10 (ref 5–15)
BUN: 25 mg/dL — ABNORMAL HIGH (ref 8–23)
CO2: 30 mmol/L (ref 22–32)
Calcium: 8.8 mg/dL — ABNORMAL LOW (ref 8.9–10.3)
Chloride: 102 mmol/L (ref 98–111)
Creatinine, Ser: 3.29 mg/dL — ABNORMAL HIGH (ref 0.44–1.00)
GFR, Estimated: 15 mL/min — ABNORMAL LOW (ref >60)
Glucose, Bld: 95 mg/dL (ref 70–99)
Potassium: 3.9 mmol/L (ref 3.5–5.1)
Sodium: 142 mmol/L (ref 135–145)

## 2023-12-30 LAB — GLUCOSE, CAPILLARY
Glucose-Capillary: 115 mg/dL — ABNORMAL HIGH (ref 70–99)
Glucose-Capillary: 72 mg/dL (ref 70–99)
Glucose-Capillary: 83 mg/dL (ref 70–99)
Glucose-Capillary: 113 mg/dL — ABNORMAL HIGH (ref 70–99)

## 2023-12-30 LAB — HEMOGLOBIN
Hemoglobin: 7.2 g/dL — ABNORMAL LOW (ref 12.0–15.0)
Hemoglobin: 7.2 g/dL — ABNORMAL LOW (ref 12.0–15.0)
Hemoglobin: 7.7 g/dL — ABNORMAL LOW (ref 12.0–15.0)

## 2023-12-30 LAB — HEMATOCRIT
HCT: 23.7 % — ABNORMAL LOW (ref 36.0–46.0)
HCT: 23.6 % — ABNORMAL LOW (ref 36.0–46.0)
HCT: 25.5 % — ABNORMAL LOW (ref 36.0–46.0)

## 2023-12-30 LAB — HEMOGLOBIN AND HEMATOCRIT, BLOOD
HCT: 23.1 % — ABNORMAL LOW (ref 36.0–46.0)
Hemoglobin: 7 g/dL — ABNORMAL LOW (ref 12.0–15.0)

## 2023-12-30 LAB — CBG MONITORING, ED: Glucose-Capillary: 92 mg/dL (ref 70–99)

## 2023-12-30 NOTE — Consult Note (Signed)
 WOC Nurse Consult Note: Patient is admitted for GI bleed  continues to receive care to left hand for  left hand and wrist irrigation and debridement for ongoing infection in the setting of prior ray resection for gangrenous digit.  Reason for Consult: Wound care guidance for left hand.  Wound type: gangrenous/ischemic Pressure Injury POA: NA Measurement:left hand and wrist  Wound azi:wzrmnupr, nonviable tissue Drainage (amount, consistency, odor) necrotic odor, minimal serous drainage Periwound:hand and wrist with gangrenous changes  second ray amputation site with sutures noted.  Dusky appearance Dressing procedure/placement/frequency: wash left hand and wrist with VASHE (LAWSON # [id number]) twice daily.  Leave open to air.  Will not follow at this time.  Please re-consult if needed.  Darice Cooley MSN, RN, FNP-BC CWON Wound, Ostomy, Continence Nurse [facility] [phone] Work cell phone:  [phone]

## 2023-12-30 NOTE — Consult Note (Signed)
 Gastroenterology Consult   Referring Provider: No ref. provider found Primary Care Physician:  Antonetta Rollene BRAVO, MD Primary Gastroenterologist:  Carlin POUR. Cindie, DO   Patient ID: Kathryn Beck; [id number]; April 29, 1955   Admit date: 12/29/2023  LOS: 0 days   Date of Consultation: 12/30/2023  Reason for Consultation:  rectal bleeding    History of Present Illness   Kathryn Beck is a 68 y.o. female  with past medical history significant for suspicious Right exophytic renal lesion seen in CTA 11/2023, GERD, pancreatitis, moderate aortic stenosis (ECHO 05/2023) recent new onset A-fib on apixaban  with cardioversion 10/27/2023, PAD (s/p R BKA in 05/2022, prior lithotripsy and stenting of the left superficial femoral and popliteal artery in 06/2022 and ultimately L BKA in 09/2022), HTN, HLD, Type 2 DM, bilateral adrenal adenoma (followed by Endocrinology), chronic HFpEF, OSA, ESRD (MWF), history of TIA's, history of recurrent chest pain  (cath in 12/2018 showing normal coronary arteries) recent hospitalization for left hand I&D, MSSA infection on antibiotics IV Ancef , recurrent admissions with acute on chronic anemia with rectal bleeding, last admission couple of weeks ago for post-polypectomy bleeding presenting with recurrent painless rectal bleeding.   ED course: VSS. BUN 22, Hgb 7 (8.7 on 12/18/23). Repeat Hgb 7.2 at 223 this morning.   GI consult: Patient unable to provide much history to me this morning (memory loss). She states that she did stop bleeding after the she was discharged from the hospital on September 27.  She restarted Eliquis  at reduced dose of 2.5 mg twice 12/22/23 (down from 5mg  BID).  Per EMR documentation, daughter called in yesterday reporting 2 small episodes of dark red blood in patient's brief yesterday morning.  Initial episode prior to going to dialysis associated with stool and then again after coming home from dialysis just dark red blood present.  They held  her Eliquis  yesterday morning after the first episode. She was advised to take patient to ED for evaluation.   No reported abdominal pain. No N/V. She reports BMs not daily but denies constipation. Appetite good. She is starving.   She was admitted early 11/2023 with painless rectal bleeding and completed colonoscopy on September 14 and suspected to have bled from right sided diverticular disease.  She had a 10 mm polyp removed from the ascending colon and a 5 mm polyp removed from the transverse colon.  She was seen again a couple of weeks later with persisting rectal bleeding since her prior admission.  Repeat colonoscopy on September 26 showed post polypectomy bleeding from the ascending polypectomy site which was closed with hemostasis clip.  She did have a CTA GI bleeding study on September 25 prior to that colonoscopy after an episode of bleeding but no active extravasation, incidentally found to have a 1.3 cm exophytic enhancing lesion arising from the posterior medial aspect of the lower pole of the right kidney suspicious for renal cell carcinoma.    Colonoscopy 12/17/23: -post polypectomy bleeding (ascending polypectomy site) closed with hemostasis clip X 1 -melanosis coli -six 4-65mm polyps in rectum, sigmoid colon, desc colon, hepatic flexure removed with hot snare -diverticulosis -non-bleeding ext/int hemorrhoids -multiple tubular adenomas -next colonoscopy 3 years.   Colonoscopy December 05, 2023: - Nonbleeding internal hemorrhoids -Diverticulosis in the sigmoid colon, hepatic flexure, ascending colon -Single 10 mm polyp in the ascending colon removed, tubular adenoma -Single 5 mm polyp in the transverse colon removed, tubular adenoma -Likely bled from right sided diverticular disease versus benign hemorrhoidal. -If evidence of  further bleeding, would recommend CT angio and consider IR consultation - Next colonoscopy in 3 years   EGD December 03, 2023 - Normal esophagus -  Medium amount of food residue in stomach - Normal duodenal bulb, first portion of duodenum and second portion of duodenum  EGD 06/2017 at [facility]: op note not viewable Path-mild chronic gastritis and no intestinal metaplasia, reactive folveolar hyperplasia, no h.pylori. negative proximal and distal esophageal biopsies.    EGD 11/2016: Dr. Harvey -no source for dysphagia -mild gastritis -bravo placed, omeprazole  does not control acid reflux   Colonoscopy 11/2016: -3 polyps removed from the rectum, tubular adenomas -redundant colon -internal hemorrhoids -next colonoscopy in 3 years       Prior to Admission medications   Medication Sig Start Date End Date Taking? Authorizing Provider  acetaminophen  (TYLENOL ) 500 MG tablet Take 1,000 mg by mouth every 6 (six) hours as needed for mild pain (pain score 1-3).    [provider]  amLODipine  (NORVASC ) 10 MG tablet Take 0.5 tablets (5 mg total) by mouth daily. 10/28/23   Pearlean Manus, MD  apixaban  (ELIQUIS ) 2.5 MG TABS tablet Take 1 tablet (2.5 mg total) by mouth 2 (two) times daily. 12/22/23   Maree, Pratik D, DO  B Complex-C-Folic Acid  (RENA-VITE RX) 1 MG TABS Take 1 tablet by mouth daily. 04/15/22   [provider]  busPIRone  (BUSPAR ) 7.5 MG tablet Take 1 tablet (7.5 mg total) by mouth 2 (two) times daily. 11/24/23   Antonetta Rollene BRAVO, MD  calcitRIOL  (ROCALTROL ) 0.5 MCG capsule Take 2 capsules (1 mcg total) by mouth every Monday, Wednesday, and Friday with hemodialysis. 11/05/23   Caleen Burgess BROCKS, MD  CEFAZOLIN  SODIUM IV Inject into the vein every Monday, Wednesday, and Friday with hemodialysis. 11/30/23 01/11/24  [provider]  cinacalcet  (SENSIPAR ) 30 MG tablet Take 3 tablets (90 mg total) by mouth every Monday, Wednesday, and Friday. 12/08/23   Johnson, Clanford L, MD  ezetimibe  (ZETIA ) 10 MG tablet Take 1 tablet (10 mg total) by mouth daily. 04/27/23   Antonetta Rollene BRAVO, MD  insulin  aspart (NOVOLOG ) 100  UNIT/ML injection Inject 0-6 Units into the skin 3 (three) times daily with meals. CBG 70 - 120: 0 units  CBG 121 - 150: 0 units  CBG 151 - 200: 1 unit  CBG 201-250: 2 units  CBG 251-300: 3 units  CBG 301-350: 4 units  CBG 351-400: 5 units  CBG > 400: Give 10 units and call MD 11/12/23   Shahmehdi, Seyed A, MD  insulin  glargine, 2 Unit Dial , (TOUJEO  MAX SOLOSTAR) 300 UNIT/ML Solostar Pen Inject 30 Units into the skin daily. May need to slowly increase the dose depending upon your blood sugar, follow-up with PCP 01/01/23   Trixie File, MD  metoprolol  succinate (TOPROL -XL) 50 MG 24 hr tablet TAKE ONE TABLET BY MOUTH ONCE DAILY WITH FOOD (TAKE AFTER DIALYSIS) 12/20/23   Antonetta Rollene BRAVO, MD  nitroGLYCERIN  (NITRODUR - DOSED IN MG/24 HR) 0.2 mg/hr patch Place 1 patch (0.2 mg total) onto the skin daily. 12/28/23   Agarwala, Anshul, MD  ondansetron  (ZOFRAN ) 4 MG tablet Take 4 mg by mouth every 8 (eight) hours as needed for nausea or vomiting. 11/26/23   [provider]  pantoprazole  (PROTONIX ) 40 MG tablet Take 1 tablet (40 mg total) by mouth daily. 12/08/23   Johnson, Clanford L, MD  sertraline  (ZOLOFT ) 50 MG tablet Take one and a half tablets once daily Patient taking differently: Take 75 mg  by mouth daily. Take one and a half tablets once daily 11/24/23   Antonetta Rollene BRAVO, MD  torsemide  (DEMADEX ) 100 MG tablet Take 1 tablet (100 mg total) by mouth every Tuesday, Thursday, Saturday, and Sunday. Patient taking differently: Take 100 mg by mouth every Tuesday, Thursday, Saturday, and Sunday. Non-dialysis days 10/28/23   Pearlean Manus, MD  [name]  500 MG chewable tablet Chew 1-2 tablets (500-1,000 mg total) by mouth See admin instructions. Take 1000mg  (2 tablets) by mouth with meals and 500mg  (1 tablet) with snacks 08/23/23   Antonetta Rollene BRAVO, MD  FLUoxetine (PROZAC) 10 MG capsule Take 10 mg by mouth daily.    05/28/11  [provider]  glipiZIDE  (GLUCOTROL ) 10 MG tablet Take 10 mg  by mouth 2 (two) times daily before a meal.    05/28/11  [provider]    Current Facility-Administered Medications  Medication Dose Route Frequency Provider Last Rate Last Admin   acetaminophen  (TYLENOL ) tablet 650 mg  650 mg Oral Q6H PRN Opyd, Timothy S, MD       Or   acetaminophen  (TYLENOL ) suppository 650 mg  650 mg Rectal Q6H PRN Opyd, Timothy S, MD       busPIRone  (BUSPAR ) tablet 7.5 mg  7.5 mg Oral BID Opyd, Timothy S, MD       [START ON 12/31/2023] ceFAZolin  (ANCEF ) IVPB 2g/100 mL premix  2 g Intravenous Q M,W,F-HD Opyd, Timothy S, MD       insulin  aspart (novoLOG ) injection 0-6 Units  0-6 Units Subcutaneous Q4H Opyd, Timothy S, MD       metoprolol  succinate (TOPROL -XL) 24 hr tablet 50 mg  50 mg Oral Daily Opyd, Timothy S, MD       pantoprazole  (PROTONIX ) EC tablet 40 mg  40 mg Oral Daily Opyd, Timothy S, MD       prochlorperazine  (COMPAZINE ) injection 5 mg  5 mg Intravenous Q6H PRN Opyd, Timothy S, MD       sertraline  (ZOLOFT ) tablet 75 mg  75 mg Oral Daily Opyd, Timothy S, MD       sodium chloride  flush (NS) 0.9 % injection 3 mL  3 mL Intravenous Q12H Opyd, Timothy S, MD   3 mL at 12/29/23 2232   Current Outpatient Medications  Medication Sig Dispense Refill   acetaminophen  (TYLENOL ) 500 MG tablet Take 1,000 mg by mouth every 6 (six) hours as needed for mild pain (pain score 1-3).     amLODipine  (NORVASC ) 10 MG tablet Take 0.5 tablets (5 mg total) by mouth daily. 45 tablet 2   apixaban  (ELIQUIS ) 2.5 MG TABS tablet Take 1 tablet (2.5 mg total) by mouth 2 (two) times daily. 60 tablet 2   B Complex-C-Folic Acid  (RENA-VITE RX) 1 MG TABS Take 1 tablet by mouth daily.     busPIRone  (BUSPAR ) 7.5 MG tablet Take 1 tablet (7.5 mg total) by mouth 2 (two) times daily. 60 tablet 3   calcitRIOL  (ROCALTROL ) 0.5 MCG capsule Take 2 capsules (1 mcg total) by mouth every Monday, Wednesday, and Friday with hemodialysis.     CEFAZOLIN  SODIUM IV Inject into the vein every Monday, Wednesday, and  Friday with hemodialysis.     cinacalcet  (SENSIPAR ) 30 MG tablet Take 3 tablets (90 mg total) by mouth every Monday, Wednesday, and Friday.     ezetimibe  (ZETIA ) 10 MG tablet Take 1 tablet (10 mg total) by mouth daily. 28 tablet 11   insulin  aspart (NOVOLOG ) 100 UNIT/ML injection Inject 0-6 Units into the skin  3 (three) times daily with meals. CBG 70 - 120: 0 units  CBG 121 - 150: 0 units  CBG 151 - 200: 1 unit  CBG 201-250: 2 units  CBG 251-300: 3 units  CBG 301-350: 4 units  CBG 351-400: 5 units  CBG > 400: Give 10 units and call MD 10 mL 11   insulin  glargine, 2 Unit Dial , (TOUJEO  MAX SOLOSTAR) 300 UNIT/ML Solostar Pen Inject 30 Units into the skin daily. May need to slowly increase the dose depending upon your blood sugar, follow-up with PCP 24 mL 1   metoprolol  succinate (TOPROL -XL) 50 MG 24 hr tablet TAKE ONE TABLET BY MOUTH ONCE DAILY WITH FOOD (TAKE AFTER DIALYSIS) 28 tablet 11   nitroGLYCERIN  (NITRODUR - DOSED IN MG/24 HR) 0.2 mg/hr patch Place 1 patch (0.2 mg total) onto the skin daily. 30 patch 12   ondansetron  (ZOFRAN ) 4 MG tablet Take 4 mg by mouth every 8 (eight) hours as needed for nausea or vomiting.     pantoprazole  (PROTONIX ) 40 MG tablet Take 1 tablet (40 mg total) by mouth daily. 30 tablet 1   sertraline  (ZOLOFT ) 50 MG tablet Take one and a half tablets once daily (Patient taking differently: Take 75 mg by mouth daily. Take one and a half tablets once daily) 45 tablet 3   torsemide  (DEMADEX ) 100 MG tablet Take 1 tablet (100 mg total) by mouth every Tuesday, Thursday, Saturday, and Sunday. (Patient taking differently: Take 100 mg by mouth every Tuesday, Thursday, Saturday, and Sunday. Non-dialysis days) 30 tablet 1   [name]  500 MG chewable tablet Chew 1-2 tablets (500-1,000 mg total) by mouth See admin instructions. Take 1000mg  (2 tablets) by mouth with meals and 500mg  (1 tablet) with snacks 90 tablet 0   Facility-Administered Medications Ordered in Other Encounters   Medication Dose Route Frequency Provider Last Rate Last Admin   0.9 %  sodium chloride  infusion   Intravenous Continuous Lockamy, Randi L, NP-C 20 mL/hr at 12/05/23 1000 Continued from Pre-op  at 12/05/23 1000    Allergies as of 12/29/2023 - Review Complete 12/29/2023  Allergen Reaction Noted   Ace inhibitors Anaphylaxis and Swelling 06/07/2007   Penicillins Itching and Swelling 06/07/2007   Statins Other (See Comments) 12/24/2012   Albuterol  Swelling 08/31/2017    Past Medical History:  Diagnosis Date   Acid reflux    Amputated left leg (HCC)    bilateral BKA   Anemia    Arthritis    Axillary masses    Soft tissue - status post excision   Back pain    CHF (congestive heart failure) (HCC)    COVID-19 virus infection 04/06/2019   Depression    End-stage renal disease (HCC)    M/W/F dialysis   Essential hypertension    Headache    years ago   History of blood transfusion    History of cardiac catheterization    Normal coronary arteries October 2020   History of claustrophobia    History of pneumonia 2019   Hypoxia 04/03/2019   Memory loss    Mixed hyperlipidemia    Obesity    Pancreatitis    Peritoneal dialysis catheter in place    Pneumonia due to COVID-19 virus 04/02/2019   Sleep apnea    Noncompliant with CPAP   Stroke (HCC)    mini stroke   Type 2 diabetes mellitus (HCC)     Past Surgical History:  Procedure Laterality Date   ABDOMINAL AORTOGRAM W/LOWER EXTREMITY N/A 04/30/2022  Procedure: ABDOMINAL AORTOGRAM W/LOWER EXTREMITY;  Surgeon: Magda Debby SAILOR, MD;  Location: [facility];  Service: Cardiovascular;  Laterality: N/A;   ABDOMINAL AORTOGRAM W/LOWER EXTREMITY N/A 07/21/2022   Procedure: ABDOMINAL AORTOGRAM W/LOWER EXTREMITY;  Surgeon: Serene Gaile ORN, MD;  Location: [facility];  Service: Cardiovascular;  Laterality: N/A;   ABDOMINAL HYSTERECTOMY     ACHILLES TENDON LENGTHENING  08/15/2022   Procedure: ACHILLES TENDON LENGTHENING;   Surgeon: Silva Juliene SAUNDERS, DPM;  Location: [facility] OR;  Service: Podiatry;;   AMPUTATION Right 05/29/2022   Procedure: RIGHT BELOW THE KNEE AMPUTATION;  Surgeon: Harden Jerona GAILS, MD;  Location: [facility] OR;  Service: Orthopedics;  Laterality: Right;   AMPUTATION Left 09/04/2022   Procedure: AMPUTATION FOOT, serial irrigation;  Surgeon: Joya Stabs, DPM;  Location: [facility] OR;  Service: Podiatry;  Laterality: Left;  Surgical team to do block   AMPUTATION Left 10/07/2022   Procedure: LEFT BELOW KNEE AMPUTATION;  Surgeon: Harden Jerona GAILS, MD;  Location: [facility] OR;  Service: Orthopedics;  Laterality: Left;   AMPUTATION FINGER Left 09/29/2023   Procedure: AMPUTATION, FINGER;  Surgeon: Harden Jerona GAILS, MD;  Location: [facility] OR;  Service: Orthopedics;  Laterality: Left;  LEFT HAND LONG FINGER RAY AMPUTATION   AV FISTULA PLACEMENT Left 09/02/2017   Procedure: creation of left arm ARTERIOVENOUS (AV) FISTULA;  Surgeon: Serene Gaile ORN, MD;  Location: [facility] OR;  Service: Vascular;  Laterality: Left;   COLONOSCOPY  2008   Dr. Harvey: normal    COLONOSCOPY N/A 12/18/2016   Dr. Harvey: multiple tubular adenomas, internal hemorrhoids. Surveillance in 3 years    COLONOSCOPY N/A 12/05/2023   Procedure: COLONOSCOPY;  Surgeon: Cindie Carlin POUR, DO;  Location: [facility];  Service: Endoscopy;  Laterality: N/A;   COLONOSCOPY N/A 12/17/2023   Procedure: COLONOSCOPY;  Surgeon: Shaaron Lamar HERO, MD;  Location: [facility];  Service: Endoscopy;  Laterality: N/A;   ESOPHAGEAL DILATION N/A 10/13/2015   Procedure: ESOPHAGEAL DILATION;  Surgeon: Claudis RAYMOND Rivet, MD;  Location: [facility];  Service: Endoscopy;  Laterality: N/A;   ESOPHAGEAL DILATION N/A 12/03/2023   Procedure: DILATION, ESOPHAGUS;  Surgeon: Cindie Carlin POUR, DO;  Location: [facility];  Service: Endoscopy;  Laterality: N/A;   ESOPHAGOGASTRODUODENOSCOPY N/A 10/13/2015   Dr. Rivet: chronic gastritis on path, no H.pylori. Empiric dilation    ESOPHAGOGASTRODUODENOSCOPY N/A 12/18/2016    Dr. Harvey: mild gastritis. BRAVO study revealed uncontrolled GERD. Dysphagia secondary to uncontrolled reflux   ESOPHAGOGASTRODUODENOSCOPY N/A 12/03/2023   Procedure: EGD (ESOPHAGOGASTRODUODENOSCOPY);  Surgeon: Cindie Carlin POUR, DO;  [facility]: [facility];  Service: Endoscopy;  Laterality: N/A;   FOOT SURGERY Bilateral    nerve     INCISION AND DRAINAGE OF WOUND Left 10/30/2023   Procedure: IRRIGATION AND DEBRIDEMENT WOUND;  Surgeon: Arlinda Buster, MD;  Location: [facility] OR;  Service: Orthopedics;  Laterality: Left;  LEFT HAND WOUND   LEFT HEART CATH AND CORONARY ANGIOGRAPHY N/A 12/29/2018   Procedure: LEFT HEART CATH AND CORONARY ANGIOGRAPHY;  Surgeon: Dann Candyce RAMAN, MD;  Location: [facility] INVASIVE CV LAB;  Service: Cardiovascular;  Laterality: N/A;   LOWER EXTREMITY ANGIOGRAPHY Right 05/04/2022   Procedure: Lower Extremity Angiography;  Surgeon: Lanis Fonda BRAVO, MD;  Location: [facility] INVASIVE CV LAB;  Service: Cardiovascular;  Laterality: Right;   LUNG BIOPSY     MASS EXCISION Right 01/09/2013   Procedure: EXCISION OF NEOPLASM OF RIGHT  AXILLA  AND EXCISION OF NEOPLASM OF LEFT AXILLA;  Surgeon: Oneil DELENA Budge,  MD;  Location: [facility];  Service: General;  Laterality: Right;  procedure end @ 08:23   MYRINGOTOMY WITH TUBE PLACEMENT Bilateral 04/28/2017   Procedure: BILATERAL MYRINGOTOMY WITH TUBE PLACEMENT;  Surgeon: Karis Clunes, MD;  Location: [facility] OR;  Service: ENT;  Laterality: Bilateral;   PERIPHERAL VASCULAR BALLOON ANGIOPLASTY Right 05/04/2022   Procedure: PERIPHERAL VASCULAR BALLOON ANGIOPLASTY;  Surgeon: Lanis Fonda BRAVO, MD;  Location: [facility] INVASIVE CV LAB;  Service: Cardiovascular;  Laterality: Right;  PT   PERIPHERAL VASCULAR INTERVENTION Right 05/04/2022   Procedure: PERIPHERAL VASCULAR INTERVENTION;  Surgeon: Lanis Fonda BRAVO, MD;  Location: [facility] INVASIVE CV LAB;  Service: Cardiovascular;  Laterality: Right;  SFA   PERIPHERAL VASCULAR INTERVENTION Left 07/21/2022   Procedure: PERIPHERAL VASCULAR  INTERVENTION;  Surgeon: Serene Gaile ORN, MD;  [facility]: [facility];  Service: Cardiovascular;  Laterality: Left;   REVISION OF ARTERIOVENOUS GORETEX GRAFT Left 05/04/2018   Procedure: TRANSPOSITION OF CEPHALIC VEIN ARTERIOVENOUS FISTULA LEFT ARM;  Surgeon: Oris Krystal FALCON, MD;  Location: [facility] OR;  Service: Vascular;  Laterality: Left;   SAVORY DILATION N/A 12/18/2016   Procedure: SAVORY DILATION;  Surgeon: Harvey Margo CROME, MD;  [facility];  Service: Endoscopy;  Laterality: N/A;   TRANSMETATARSAL AMPUTATION Left 08/15/2022   Procedure: TRANSMETATARSAL AMPUTATION;  Surgeon: Silva Juliene SAUNDERS, DPM;  Location: [facility] OR;  Service: Podiatry;  Laterality: Left;    Family History  Problem Relation Age of Onset   Hypertension Father    Hypercholesterolemia Father    Arthritis Father    Hypertension Sister    Hypercholesterolemia Sister    Breast cancer Sister    Hypertension Sister    Colon cancer Neg Hx    Colon polyps Neg Hx     Social History   Socioeconomic History   Marital status: Married    Spouse name: Londynn Sonoda   Number of children: 2   Years of education: 12   Highest education level: Some college, no degree  Occupational History   Occupation: retired   Tobacco Use   Smoking status: Never    Passive exposure: Never   Smokeless tobacco: Never   Tobacco comments:    Verified by Daughter, [name]  Vaping Use   Vaping status: Never Used  Substance and Sexual Activity   Alcohol use: No   Drug use: No   Sexual activity: Not Currently    Partners: Male  Other Topics Concern   Not on file  Social History Narrative   Lives alone with husband    Right handed   Caffeine-1/2 daily   Social Drivers of Health   Financial Resource Strain: Low Risk  (11/24/2023)   Overall Financial Resource Strain (CARDIA)    Difficulty of Paying Living Expenses: Not hard at all  Food Insecurity: No Food Insecurity (12/21/2023)   Hunger Vital Sign    Worried About  Running Out of Food in the Last Year: Never true    Ran Out of Food in the Last Year: Never true  Transportation Needs: No Transportation Needs (12/21/2023)   PRAPARE - Administrator, Civil Service (Medical): No    Lack of Transportation (Non-Medical): No  Physical Activity: Inactive (11/24/2023)   Exercise Vital Sign    Days of Exercise per Week: 0 days    Minutes of Exercise per Session: Not on file  Stress: Stress Concern Present (11/24/2023)   [employer] of Occupational Health - Occupational Stress Questionnaire    Feeling of Stress:  To some extent  Social Connections: Moderately Integrated (12/16/2023)   Social Connection and Isolation Panel    Frequency of Communication with Friends and Family: More than three times a week    Frequency of Social Gatherings with Friends and Family: More than three times a week    Attends Religious Services: 1 to 4 times per year    Active Member of [employer] or Organizations: No    Attends Banker Meetings: Never    Marital Status: Married  Recent Concern: Social Connections - Moderately Isolated (11/25/2023)   Social Connection and Isolation Panel    Frequency of Communication with Friends and Family: Three times a week    Frequency of Social Gatherings with Friends and Family: Three times a week    Attends Religious Services: Patient declined    Active Member of Clubs or Organizations: No    Attends Banker Meetings: Never    Marital Status: Married  Catering manager Violence: Not At Risk (12/21/2023)   Humiliation, Afraid, Rape, and Kick questionnaire    Fear of Current or Ex-Partner: No    Emotionally Abused: No    Physically Abused: No    Sexually Abused: No     Review of System:  unreliable General: Negative for anorexia, weight loss, fever, chills, fatigue, weakness. Eyes: Negative for vision changes.  ENT: Negative for hoarseness, difficulty swallowing , nasal congestion. CV: Negative for chest  pain, angina, palpitations, dyspnea on exertion, peripheral edema.  Respiratory: Negative for dyspnea at rest, dyspnea on exertion, cough, sputum, wheezing.  GI: See history of present illness. GU:  Negative for dysuria, hematuria, urinary incontinence, urinary frequency, nocturnal urination.  MS: Negative for joint pain, low back pain.  Derm: Negative for rash or itching.  Neuro: Negative for weakness, abnormal sensation, seizure, frequent headaches, memory loss, confusion.  Psych: Negative for anxiety, depression, suicidal ideation, hallucinations.  Endo: Negative for unusual weight change.  Heme: Negative for bruising or bleeding. Allergy: Negative for rash or hives.      Physical Examination:   Vital signs in last 24 hours: Temp:  [98.2 F (36.8 C)] 98.2 F (36.8 C) (10/08 1753) Pulse Rate:  [57-63] 62 (10/09 0400) Resp:  [12-19] 13 (10/09 0400) BP: (133-160)/(51-68) 156/68 (10/09 0400) SpO2:  [95 %-99 %] 98 % (10/09 0400) Weight:  [69.2 kg] 69.2 kg (10/08 1754)    General: Well-nourished, well-developed in no acute distress.  Head: Normocephalic, atraumatic.   Eyes: Conjunctiva pale, no icterus. Mouth: Oropharyngeal mucosa moist and pink , no lesions erythema or exudate. Neck: Supple without thyromegaly, masses, or lymphadenopathy.  Lungs: Clear to auscultation bilaterally.  Heart: Regular rate and rhythm, no murmurs rubs or gallops.  Abdomen: Bowel sounds are normal, nontender, nondistended, no hepatosplenomegaly or masses, no abdominal bruits or hernia , no rebound or guarding.   Rectal: external inspection, no hemorrhoids noted externally, no blood noted Extremities: No lower extremity edema, clubbing, deformity.  Neuro: Alert and oriented x 4 , grossly normal neurologically.  Skin: Warm and dry, no rash or jaundice.   Psych: Alert and cooperative, normal mood and affect.        Intake/Output from previous day: No intake/output data recorded. Intake/Output this  shift: No intake/output data recorded.  Lab Results:   CBC Recent Labs    12/29/23 1842 12/30/23 0223  WBC 8.1  --   HGB 7.0* 7.2*  HCT 23.3* 23.7*  MCV 87.9  --   PLT 308  --  BMET Recent Labs    12/29/23 1842 12/30/23 0223  NA 144 142  K 4.0 3.9  CL 102 102  CO2 30 30  GLUCOSE 127* 95  BUN 22 25*  CREATININE 3.04* 3.29*  CALCIUM  8.6* 8.8*   LFT No results for input(s): BILITOT, BILIDIR, IBILI, ALKPHOS, AST, ALT, PROT, ALBUMIN  in the last 72 hours.  Lipase No results for input(s): LIPASE in the last 72 hours.  PT/INR No results for input(s): LABPROT, INR in the last 72 hours.   Hepatitis Panel No results for input(s): HEPBSAG, HCVAB, HEPAIGM, HEPBIGM in the last 72 hours.   Imaging Studies:   CT ANGIO GI BLEED Result Date: 12/16/2023 CLINICAL DATA:  Persistent GI bleed status post colonoscopy with polypectomy EXAM: CTA ABDOMEN AND PELVIS WITHOUT AND WITH CONTRAST TECHNIQUE: Multidetector CT imaging of the abdomen and pelvis was performed using the standard protocol during bolus administration of intravenous contrast. Multiplanar reconstructed images and MIPs were obtained and reviewed to evaluate the vascular anatomy. RADIATION DOSE REDUCTION: This exam was performed according to the departmental dose-optimization program which includes automated exposure control, adjustment of the mA and/or kV according to patient size and/or use of iterative reconstruction technique. CONTRAST:  OMNIPAQUE  IOHEXOL  350 MG/ML SOLN COMPARISON:  CT abdomen and pelvis dated 03/24/2017 FINDINGS: VASCULAR Aorta: Normal caliber aorta and branch vessels without aneurysm, dissection, vasculitis or significant stenosis. No active extravasation. Aortic atherosclerosis. Conventional celiac branching pattern. Single bilateral renal arteries demonstrating mild luminal narrowing of the origins due to atherosclerotic plaque. Mild to moderate luminal narrowing of the  internal iliac arteries bilaterally due to atherosclerotic plaque. Proximal Outflow: Segmental mild luminal narrowing of bilateral superficial femoral arteries due to atherosclerotic plaque. No aneurysm. Veins: No obvious venous abnormality within the limitations of this arterial phase study. Review of the MIP images confirms the above findings. NON-VASCULAR Lower chest: Unchanged left lower lobe scarring and pleural thickening. No pneumothorax. Mild multichamber cardiomegaly. Coronary artery calcifications. Hepatobiliary: Subcentimeter hypodensity in the caudate lobe (15:22), too small to characterize. No intra or extrahepatic biliary ductal dilation. Normal gallbladder. Pancreas: No focal lesions or main ductal dilation. Spleen: Normal in size without focal abnormality. Adrenals/Urinary Tract: Unchanged bilateral adrenal nodules measuring 1.7 cm, 29 HU on the right and 1.9 cm, 20 HU on the left, likely adenomas. No specific follow-up imaging recommended. No hydronephrosis or renal calculi. Bilateral renal cysts, many of which are too small to characterize. A 1.3 cm exophytic enhancing lesion arising from the posteromedial aspect of the lower pole right kidney (15:48) is new from 2019. No focal bladder wall thickening. Stomach/Bowel: Normal appearance of the stomach. No evidence of bowel wall thickening, distention, or inflammatory changes. Colonic diverticulosis without acute diverticulitis. Appendix is not discretely seen. Lymphatic: No enlarged abdominal or pelvic lymph nodes. Reproductive: No adnexal masses. Other: No free fluid, fluid collection, or free air. Sequela of fat necrosis in the midline anterior peritoneum (4:51). Musculoskeletal: No acute or abnormal lytic or blastic osseous lesions. Postsurgical changes of the anterior abdominal wall. Metallic clips adjacent to the right inguinal region and in the lower quadrant. Multilevel degenerative changes of the partially imaged thoracic and lumbar spine.  IMPRESSION: 1. No active extravasation. 2. A 1.3 cm exophytic enhancing lesion arising from the posteromedial aspect of the lower pole right kidney is new from 2019 and suspicious for renal cell carcinoma. Recommend consultation to urology. 3. Colonic diverticulosis without acute diverticulitis. 4. Aortic Atherosclerosis (ICD10-I70.0). Coronary artery calcifications. Assessment for potential risk factor modification, dietary  therapy or pharmacologic therapy may be warranted, if clinically indicated. Electronically Signed   By: Limin  Xu M.D.   On: 12/16/2023 14:54   DG CHEST PORT 1 VIEW Result Date: 12/06/2023 CLINICAL DATA:  Leukocytosis EXAM: PORTABLE CHEST 1 VIEW COMPARISON:  Radiograph 11/10/2023 FINDINGS: Stable large cardiac silhouette. There is increased patchy airspace disease in the LEFT upper lobe and LEFT lower lobe. RIGHT lung clear. No pneumothorax. No acute osseous abnormality. IMPRESSION: New airspace patchy density in the LEFT lung. Findings concerning for multifocal pneumonia. Electronically Signed   By: Jackquline Boxer M.D.   On: 12/06/2023 13:52  [4 week]  Assessment:   68 y/o female with past medical history significant for suspicious Right exophytic renal lesion seen in CTA 11/2023, GERD, pancreatitis, moderate aortic stenosis (ECHO 05/2023) recent new onset A-fib on apixaban  with cardioversion 10/27/2023, PAD (s/p R BKA in 05/2022, prior lithotripsy and stenting of the left superficial femoral and popliteal artery in 06/2022 and ultimately L BKA in 09/2022), HTN, HLD, Type 2 DM, bilateral adrenal adenoma (followed by Endocrinology), chronic HFpEF, OSA, ESRD (MWF), history of TIA's, history of recurrent chest pain  (cath in 12/2018 showing normal coronary arteries) recent hospitalization for left hand I&D, MSSA infection on antibiotics IV Ancef , recurrent admissions with acute on chronic anemia with rectal bleeding, last admission couple of weeks ago for post-polypectomy bleeding presenting  with recurrent painless rectal bleeding.   Recurrent painless hematochezia: Presenting with recurrent rectal bleeding, with some decline in Hgb from two weeks prior (8.7 to 7). Couple of episodes of dark red blood noted prior to presentation.  No bleeding noted since admission. -colonoscopy 12/05/23 with suspected right sided diverticular bleeding, 10 mm polyp removed from ascending colon (cold snare), 5 mm polyp removed from the transverse colon (cold snare), nonbleeding internal hemorrhoids.  Eliquis  5 mg twice daily resumed. -Patient had persistent hematochezia presented back 12/16/23 CTA 12/16/23 negative for active bleeding, incidental suspicious right renal lesion detected - Colonoscopy 12/17/23 with post polypectomy bleeding at the ascending polypectomy site closed with hemostasis clip. Six 4-73mm polyps in rectum, sigm, desc colon and hepatic flexure removed (hot snare) -Eliquis  restarted 12/23/23 at decreased to 2.5 mg twice daily due to appropriate -last dose of Eliquis  12/28/23 evening  Bleeding may be due to hemorrhoids, post-polypectomy, or diverticular in setting of Eliquis . She has not had any bleeding since presentation to hospital.   Suspicious right exophytic renal lesion on CTA: Recommend urology evaluation, as outpatient   Plan:   Consider monitoring for now given no further bleeding since presentation. Continue to hold Eliquis .  Outpatient urology consult for renal lesion, to be arranged by attending or PCP.  Clear liquids for now.   LOS: 0 days   We would like to thank you for the opportunity to participate in the care of DELORAS REICHARD.  Sonny RAMAN. Ezzard RIGGERS [facility] [phone] 10/9/20256:13 AM

## 2023-12-30 NOTE — Assessment & Plan Note (Addendum)
 12/30/23 on SSI.  12/31/23 stable CBG

## 2023-12-30 NOTE — Consult Note (Signed)
 Kathryn Beck Admit Date: 12/29/2023 12/30/2023 Kathryn Beck Requesting Physician:  Laurence DO  Reason for Consult:  ESRD Comanagement HPI:  68F ESRD admitted overnight recurrent lower GI bleeding after recent admission for diverticular hemorrhage and post polypectomy bleeding.  She is on apixaban  for atrial fibrillation.  Also currently dealing with gangrene of the left hand followed by surgery and treated with cefazolin  and dialysis.  GI following for her bleeding.  Presenting hemoglobin 7.0, at the time of discharge on 12/18/2023 was 8.7.  Patient receives dialysis at [facility] here on [facility] Wednesday Friday schedule using left upper arm AV fistula.  She states that she went to dialysis yesterday 10/8 and it was uneventful.  She does not appear to be the best historian.  She had a CTA of the abdomen during her previous admission.  There was an exophytic lesion at the lower pole of the right kidney suspicious for renal cell carcinoma.  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  Last Assessment & Plan:   Multiple polyps with  surveillance due in 2021.  IMO SNOMED Dx Update Oct 2024     History of pneumonia 2019   Hypoxia 04/03/2019   Memory loss    Mixed hyperlipidemia    Not currently working due to disabled status 06/13/2018   Formatting of this note might be different from the original.  Last Assessment & Plan:   Formatting of this note might be different from the original.  Reports being  disabled since April/May 2019 , hospitalized then in a niursing home, now on dialysis and is unable to work     Obesity    Pancreatitis    Peritoneal dialysis catheter in place    Pneumonia due to COVID-19 virus 04/02/2019   Sleep apnea    Noncompliant with CPAP   Stroke (HCC)    mini stroke   Type 2 diabetes mellitus (HCC)    PSH  Past Surgical History:  Procedure Laterality Date   ABDOMINAL AORTOGRAM W/LOWER EXTREMITY N/A 04/30/2022   Procedure: ABDOMINAL AORTOGRAM W/LOWER EXTREMITY;  Surgeon: Magda Debby SAILOR, MD;  Location: [facility] INV[facility];  Service: Cardiovascular;  Laterality: N/A;   ABDOMINAL AORTOGRAM W/LOWER EXTREMITY N/A 07/21/2022   Procedure: ABDOMINAL AORTOGRAM W/LOWER EXTREMITY;  Surgeon: Serene Gaile ORN, MD;  Location: [facility];  Service: Cardiovascular;  Laterality: N/A;   ABDOMINAL HYSTERECTOMY     ACHILLES TENDON LENGTHENING  08/15/2022  Procedure: ACHILLES TENDON LENGTHENING;  Surgeon: Silva Juliene SAUNDERS, DPM;  Location: [facility] OR;  Service: Podiatry;;   AMPUTATION Right 05/29/2022   Procedure: RIGHT BELOW THE KNEE AMPUTATION;  Surgeon: Harden Jerona GAILS, MD;  Location: [facility] OR;  Service: Orthopedics;  Laterality: Right;   AMPUTATION Left 09/04/2022   Procedure: AMPUTATION FOOT, serial irrigation;  Surgeon: Joya Stabs, DPM;  Location: [facility] OR;  Service: Podiatry;  Laterality: Left;  Surgical team to do block   AMPUTATION Left 10/07/2022   Procedure: LEFT BELOW KNEE AMPUTATION;  Surgeon: Harden Jerona GAILS, MD;  Location: [facility] OR;  Service: Orthopedics;  Laterality: Left;   AMPUTATION FINGER Left 09/29/2023    Procedure: AMPUTATION, FINGER;  Surgeon: Harden Jerona GAILS, MD;  Location: [facility] OR;  Service: Orthopedics;  Laterality: Left;  LEFT HAND LONG FINGER RAY AMPUTATION   AV FISTULA PLACEMENT Left 09/02/2017   Procedure: creation of left arm ARTERIOVENOUS (AV) FISTULA;  Surgeon: Serene Gaile ORN, MD;  Location: [facility] OR;  Service: Vascular;  Laterality: Left;   COLONOSCOPY  2008   Dr. Harvey: normal    COLONOSCOPY N/A 12/18/2016   Dr. Harvey: multiple tubular adenomas, internal hemorrhoids. Surveillance in 3 years    COLONOSCOPY N/A 12/05/2023   Procedure: COLONOSCOPY;  Surgeon: Cindie Carlin POUR, DO;  Location: [facility];  Service: Endoscopy;  Laterality: N/A;   COLONOSCOPY N/A 12/17/2023   Procedure: COLONOSCOPY;  Surgeon: Shaaron Lamar HERO, MD;  Location: [facility];  Service: Endoscopy;  Laterality: N/A;   ESOPHAGEAL DILATION N/A 10/13/2015   Procedure: ESOPHAGEAL DILATION;  Surgeon: Claudis RAYMOND Rivet, MD;  Location: [facility];  Service: Endoscopy;  Laterality: N/A;   ESOPHAGEAL DILATION N/A 12/03/2023   Procedure: DILATION, ESOPHAGUS;  Surgeon: Cindie Carlin POUR, DO;  [facility]: [facility];  Service: Endoscopy;  Laterality: N/A;   ESOPHAGOGASTRODUODENOSCOPY N/A 10/13/2015   Dr. Rivet: chronic gastritis on path, no H.pylori. Empiric dilation    ESOPHAGOGASTRODUODENOSCOPY N/A 12/18/2016   Dr. Harvey: mild gastritis. BRAVO study revealed uncontrolled GERD. Dysphagia secondary to uncontrolled reflux   ESOPHAGOGASTRODUODENOSCOPY N/A 12/03/2023   Procedure: EGD (ESOPHAGOGASTRODUODENOSCOPY);  Surgeon: Cindie Carlin POUR, DO;  [facility];  Service: Endoscopy;  Laterality: N/A;   FOOT SURGERY Bilateral    nerve     INCISION AND DRAINAGE OF WOUND Left 10/30/2023   Procedure: IRRIGATION AND DEBRIDEMENT WOUND;  Surgeon: Arlinda Buster, MD;  Location: [facility] OR;  Service: Orthopedics;  Laterality: Left;  LEFT HAND WOUND   LEFT HEART CATH AND CORONARY ANGIOGRAPHY N/A 12/29/2018   Procedure: LEFT  HEART CATH AND CORONARY ANGIOGRAPHY;  Surgeon: Dann Candyce RAMAN, MD;  Location: [facility] INVASIVE CV LAB;  Service: Cardiovascular;  Laterality: N/A;   LOWER EXTREMITY ANGIOGRAPHY Right 05/04/2022   Procedure: Lower Extremity Angiography;  Surgeon: Lanis Fonda BRAVO, MD;  Location: [facility] INVASIVE CV LAB;  Service: Cardiovascular;  Laterality: Right;   LUNG BIOPSY     MASS EXCISION Right 01/09/2013   Procedure: EXCISION OF NEOPLASM OF RIGHT  AXILLA  AND EXCISION OF NEOPLASM OF LEFT AXILLA;  Surgeon: Oneil DELENA Budge, MD;  Location: [facility] ORS;  Service: General;  Laterality: Right;  procedure end @ 08:23   MYRINGOTOMY WITH TUBE PLACEMENT Bilateral 04/28/2017   Procedure: BILATERAL MYRINGOTOMY WITH TUBE PLACEMENT;  Surgeon: Karis Clunes, MD;  Location: [facility] OR;  Service: ENT;  Laterality: Bilateral;   PERIPHERAL VASCULAR BALLOON ANGIOPLASTY Right 05/04/2022   Procedure: PERIPHERAL VASCULAR BALLOON ANGIOPLASTY;  Surgeon: Lanis Fonda BRAVO, MD;  Location: [facility]  INVASIVE CV LAB;  Service: Cardiovascular;  Laterality: Right;  PT   PERIPHERAL VASCULAR INTERVENTION Right 05/04/2022   Procedure: PERIPHERAL VASCULAR INTERVENTION;  Surgeon: Lanis Fonda BRAVO, MD;  Location: [facility] INVASIVE CV LAB;  Service: Cardiovascular;  Laterality: Right;  SFA   PERIPHERAL VASCULAR INTERVENTION Left 07/21/2022   Procedure: PERIPHERAL VASCULAR INTERVENTION;  Surgeon: Serene Gaile ORN, MD;  [facility]: [facility];  Service: Cardiovascular;  Laterality: Left;   REVISION OF ARTERIOVENOUS GORETEX GRAFT Left 05/04/2018   Procedure: TRANSPOSITION OF CEPHALIC VEIN ARTERIOVENOUS FISTULA LEFT ARM;  Surgeon: Oris Krystal FALCON, MD;  Location: [facility] OR;  Service: Vascular;  Laterality: Left;   SAVORY DILATION N/A 12/18/2016   Procedure: SAVORY DILATION;  Surgeon: Harvey Margo CROME, MD;  [facility];  Service: Endoscopy;  Laterality: N/A;   TRANSMETATARSAL AMPUTATION Left 08/15/2022   Procedure: TRANSMETATARSAL AMPUTATION;  Surgeon: Silva Juliene SAUNDERS, DPM;   Location: [facility] OR;  Service: Podiatry;  Laterality: Left;   FH  Family History  Problem Relation Age of Onset   Hypertension Father    Hypercholesterolemia Father    Arthritis Father    Hypertension Sister    Hypercholesterolemia Sister    Breast cancer Sister    Hypertension Sister    Colon cancer Neg Hx    Colon polyps Neg Hx    SH  reports that she has never smoked. She has never been exposed to tobacco smoke. She has never used smokeless tobacco. She reports that she does not drink alcohol and does not use drugs. Allergies  Allergies  Allergen Reactions   Ace Inhibitors Anaphylaxis and Swelling   Penicillins Itching and Swelling    Has tolerated cefazolin  on multiple occasions    Statins Other (See Comments)    Elevated LFT's   Albuterol  Swelling   Home medications Prior to Admission medications   Medication Sig Start Date End Date Taking? Authorizing Provider  acetaminophen  (TYLENOL ) 500 MG tablet Take 1,000 mg by mouth every 6 (six) hours as needed for mild pain (pain score 1-3).    [provider]  amLODipine  (NORVASC ) 10 MG tablet Take 0.5 tablets (5 mg total) by mouth daily. 10/28/23   Pearlean Manus, MD  apixaban  (ELIQUIS ) 2.5 MG TABS tablet Take 1 tablet (2.5 mg total) by mouth 2 (two) times daily. 12/22/23   Maree, Pratik D, DO  B Complex-C-Folic Acid  (RENA-VITE RX) 1 MG TABS Take 1 tablet by mouth daily. 04/15/22   [provider]  busPIRone  (BUSPAR ) 7.5 MG tablet Take 1 tablet (7.5 mg total) by mouth 2 (two) times daily. 11/24/23   Antonetta Rollene BRAVO, MD  calcitRIOL  (ROCALTROL ) 0.5 MCG capsule Take 2 capsules (1 mcg total) by mouth every Monday, Wednesday, and Friday with hemodialysis. 11/05/23   Caleen Burgess BROCKS, MD  CEFAZOLIN  SODIUM IV Inject into the vein every Monday, Wednesday, and Friday with hemodialysis. 11/30/23 01/11/24  [provider]  cinacalcet  (SENSIPAR ) 30 MG tablet Take 3 tablets (90 mg total) by mouth every Monday, Wednesday, and  Friday. 12/08/23   Johnson, Clanford L, MD  ezetimibe  (ZETIA ) 10 MG tablet Take 1 tablet (10 mg total) by mouth daily. 04/27/23   Antonetta Rollene BRAVO, MD  insulin  aspart (NOVOLOG ) 100 UNIT/ML injection Inject 0-6 Units into the skin 3 (three) times daily with meals. CBG 70 - 120: 0 units  CBG 121 - 150: 0 units  CBG 151 - 200: 1 unit  CBG 201-250: 2 units  CBG 251-300: 3 units  CBG  301-350: 4 units  CBG 351-400: 5 units  CBG > 400: Give 10 units and call MD 11/12/23   Willette Adriana LABOR, MD  insulin  glargine, 2 Unit Dial , (TOUJEO  MAX SOLOSTAR) 300 UNIT/ML Solostar Pen Inject 30 Units into the skin daily. May need to slowly increase the dose depending upon your blood sugar, follow-up with PCP 01/01/23   Trixie File, MD  metoprolol  succinate (TOPROL -XL) 50 MG 24 hr tablet TAKE ONE TABLET BY MOUTH ONCE DAILY WITH FOOD (TAKE AFTER DIALYSIS) 12/20/23   Antonetta Rollene BRAVO, MD  nitroGLYCERIN  (NITRODUR - DOSED IN MG/24 HR) 0.2 mg/hr patch Place 1 patch (0.2 mg total) onto the skin daily. 12/28/23   Agarwala, Anshul, MD  ondansetron  (ZOFRAN ) 4 MG tablet Take 4 mg by mouth every 8 (eight) hours as needed for nausea or vomiting. 11/26/23   [provider]  pantoprazole  (PROTONIX ) 40 MG tablet Take 1 tablet (40 mg total) by mouth daily. 12/08/23   Johnson, Clanford L, MD  sertraline  (ZOLOFT ) 50 MG tablet Take one and a half tablets once daily Patient taking differently: Take 75 mg by mouth daily. Take one and a half tablets once daily 11/24/23   Antonetta Rollene BRAVO, MD  torsemide  (DEMADEX ) 100 MG tablet Take 1 tablet (100 mg total) by mouth every Tuesday, Thursday, Saturday, and Sunday. Patient taking differently: Take 100 mg by mouth every Tuesday, Thursday, Saturday, and Sunday. Non-dialysis days 10/28/23   Pearlean Manus, MD  [name]  500 MG chewable tablet Chew 1-2 tablets (500-1,000 mg total) by mouth See admin instructions. Take 1000mg  (2 tablets) by mouth with meals and 500mg  (1 tablet) with  snacks 08/23/23   Antonetta Rollene BRAVO, MD  FLUoxetine (PROZAC) 10 MG capsule Take 10 mg by mouth daily.    05/28/11  [provider]  glipiZIDE  (GLUCOTROL ) 10 MG tablet Take 10 mg by mouth 2 (two) times daily before a meal.    05/28/11  [provider]    Current Medications Scheduled Meds:  busPIRone   7.5 mg Oral BID   insulin  aspart  0-6 Units Subcutaneous Q4H   metoprolol  succinate  50 mg Oral Daily   pantoprazole   40 mg Oral Daily   sertraline   75 mg Oral Daily   sodium chloride  flush  3 mL Intravenous Q12H   Continuous Infusions:  [START ON 12/31/2023]  ceFAZolin  (ANCEF ) IV     PRN Meds:.acetaminophen  **OR** acetaminophen , prochlorperazine   CBC Recent Labs  Lab 12/29/23 1842 12/30/23 0223  WBC 8.1  --   HGB 7.0* 7.2*  HCT 23.3* 23.7*  MCV 87.9  --   PLT 308  --    Basic Metabolic Panel Recent Labs  Lab 12/29/23 1842 12/30/23 0223  NA 144 142  K 4.0 3.9  CL 102 102  CO2 30 30  GLUCOSE 127* 95  BUN 22 25*  CREATININE 3.04* 3.29*  CALCIUM  8.6* 8.8*    Physical Exam  Blood pressure (!) 145/60, pulse 61, temperature 98.1 F (36.7 C), temperature source Oral, resp. rate 16, height 5' 4 (1.626 m), weight 69.2 kg, SpO2 100%. GEN: Chronically ill-appearing, NAD ENT: NCAT CV: Regular, normal S1 and S2, no rub PULM: Clear bilaterally ABD: Soft, nontender SKIN: Left hand not examined, images noted from hospitalist progress note today EXT: Right BKA, not edematous  Assessment 84F ESRD MWF here with lower GI bleeding, recurrent with worsening anemia  ABLA with lower GI bleeding; complicated by anemia of CKD ESRD on HD Monday Wednesday Friday, [facility] CKD-BMD calcium   stable with no current issues Hypertension/volume: No evidence of overload, blood pressures are stable. Gangrenous left hand followed by hand surgery on outpatient antibiotics a Atrial fibrillation on outpatient apixaban  DM2 PAD CT abdomen 10/8 with findings suggestive of  renal cell carcinoma  Plan Dialysis tomorrow on schedule Will need to balance the risks and benefits of ESA therapy with potentially active malignancy. She has numerous severe comorbidities with multiple hospitalizations recently, recommend consideration of palliative involvement.  Kathryn Beck  12/30/2023, 12:23 PM

## 2023-12-30 NOTE — Plan of Care (Signed)

## 2023-12-30 NOTE — Subjective & Objective (Signed)
 Pt seen and examined. Dtr at bedside. Pt had HD today. Got hypotensive after about 1 L UF removed. Needed to get about 500 ml IVF to support BP.  Pt had 3 bloody stools overnight.  Awaiting GI to see patient. HgB of 6.3 obtained when pt was getting IVF bolus. Repeat HgB pending.

## 2023-12-30 NOTE — Progress Notes (Signed)
   12/30/23 2100  BiPAP/CPAP/SIPAP  $ Non-Invasive Home Ventilator  Initial  $ Face Mask Medium Yes  BiPAP/CPAP/SIPAP Pt Type Adult  BiPAP/CPAP/SIPAP DREAMSTATIOND  Mask Type Full face mask  Dentures removed? Not applicable  Mask Size Medium  EPAP 6 cmH2O  FiO2 (%) 21 %  Patient Home Machine No  Patient Home Mask No  Patient Home Tubing No  Auto Titrate No  Device Plugged into RED Power Outlet Yes

## 2023-12-30 NOTE — Assessment & Plan Note (Addendum)
 12/30/23 GI consulted. Monitor HgB. Holding Eliquis .  12/31/23 had 3 bloody stools overnight. Awaiting GI input. Repeat HgB as her HgB of 6.3 was drawn while pt was getting IVF bolus after HD. Transfuse PRBC if her HgB is really <7.

## 2023-12-30 NOTE — Assessment & Plan Note (Addendum)
 12/30/23 continue ntg patch to left wrist.  12/31/23 continue NTG patch to left wrist.

## 2023-12-30 NOTE — Assessment & Plan Note (Addendum)
 12/30/23 nephrology consulted for routine HD.  12/31/23 continue routine HD. Got hypotensive today. Only 500 ml UF removed.

## 2023-12-30 NOTE — Progress Notes (Signed)
 PROGRESS NOTE    Kathryn Beck  FMW:[id number] DOB: 03-19-1956 DOA: 12/29/2023 PCP: Antonetta Rollene BRAVO, MD  Subjective: Patient seen and examined early this morning.  Went and saw her down to 2:00 for dressing and to take pictures of her left hand.  Daughter is at the bedside.  No more rectal bleeding since the ER.  GI consulted.  Patient follows with hand surgery due to her gangrene of her left hand.   Hospital Course: CC: rectal bleeding HPI:  Kathryn Beck is a 68 y.o. female with medical history significant for hypertension, hyperlipidemia, type 2 diabetes mellitus, atrial fibrillation on Eliquis , PAD with bilateral BKA, memory loss, ESRD, finger gangrene status post I&D currently on cefazolin  with dialysis, and recent admissions for GI bleeding who presents to the emergency department with rectal bleeding.   Patient is accompanied by her husband and daughter who assisted with the history.  She was noted to have dark red blood in her stool this morning.  Her Eliquis  was held this morning in light of this.  She had dialysis today and following that, was noted to have dark red blood from her rectum without any stool.  The patient denies any pain, nausea, or vomiting.   She had been admitted to the hospital in September with GI bleeding that was suspected to be diverticular.  She had polypectomy performed and was readmitted with post polypectomy bleeding.  Her Eliquis  had been on hold but was resumed on 12/22/2023 at 2.5 mg twice daily (she was previously on 5 mg twice daily).   ED Course: Upon arrival to the ED, patient is found to be afebrile and saturating well on room air with normal HR and stable BP.  Labs are most notable for normal BUN, creatinine 3.04, normal WBC, and hemoglobin 7.0.   Type and screen was performed in the ED.    Significant Events: Admitted 12/29/2023 for rectal bleeding   Admission Labs: WBC 8.1, HgB 7.0, plt 308 Na 144, K 4.0, CO2 of 30, BUN 22, Scr 3.04,  glu 127  Admission Imaging Studies:   Significant Labs:   Significant Imaging Studies:   Antibiotic Therapy: Anti-infectives (From admission, onward)    Start     Dose/Rate Route Frequency Ordered Stop   12/31/23 1200  ceFAZolin  (ANCEF ) IVPB 2g/100 mL premix        2 g 200 mL/hr over 30 Minutes Intravenous Every M-W-F (Hemodialysis) 12/29/23 2129         Procedures:   Consultants: GI nephrology    Assessment and Plan: * Rectal bleeding 12/30/23 GI consulted. Monitor HgB. Holding Eliquis .   Dry gangrene (HCC) - left hand 12/30/23 wound care consult. Follows with hand surgery.        Acute blood loss anemia 12/30/23 monitor HgB. Transfuse as needed to keep HgB >= 7.0   PAF (paroxysmal atrial fibrillation) (HCC) 12/30/23 holding Eliquis  due to GI bleeding. Continue Toprol -XL   ESRD on hemodialysis (HCC) 12/30/23 nephrology consulted for routine HD.   Uncontrolled type 2 diabetes mellitus with hyperglycemia (HCC) 12/30/23 on SSI.   PAD (peripheral artery disease) 12/30/23 continue ntg patch to left wrist.   HTN (hypertension) 12/30/23  continue Toprol -XL   OSA (obstructive sleep apnea) 12/30/23 stable   DVT prophylaxis: pt has bilateral AKA    Code Status: Full Code Family Communication: discussed with pt's dtr at bedside Disposition Plan: home Reason for continuing need for hospitalization: monitoring HgB. Gi consulted  Objective: Vitals:   12/30/23 0700  12/30/23 0715 12/30/23 0716 12/30/23 0753  BP:  (!) 157/69  (!) 145/60  Pulse: (!) 58 63  61  Resp: 14   16  Temp:   98.1 F (36.7 C) 98.1 F (36.7 C)  TempSrc:   Oral Oral  SpO2: 98% 98%  100%  Weight:      Height:       No intake or output data in the 24 hours ending 12/30/23 1209 Filed Weights   12/29/23 1754  Weight: 69.2 kg    Examination:  Physical Exam Vitals and nursing note reviewed.  Constitutional:      General: She is not in acute distress.     Appearance: She is obese. She is not toxic-appearing.  HENT:     Head: Normocephalic and atraumatic.  Cardiovascular:     Rate and Rhythm: Normal rate and regular rhythm.  Pulmonary:     Effort: Pulmonary effort is normal.     Breath sounds: Normal breath sounds.  Abdominal:     General: Bowel sounds are normal.     Palpations: Abdomen is soft.  Musculoskeletal:     Comments: Bilateral AKA  Skin:    Capillary Refill: Capillary refill takes less than 2 seconds.     Comments: Dry gangrene left hand. See pics  Neurological:     Mental Status: She is alert and oriented to person, place, and time.        Data Reviewed: I have personally reviewed following labs and imaging studies  CBC: Recent Labs  Lab 12/29/23 1842 12/30/23 0223  WBC 8.1  --   HGB 7.0* 7.2*  HCT 23.3* 23.7*  MCV 87.9  --   PLT 308  --    Basic Metabolic Panel: Recent Labs  Lab 12/29/23 1842 12/30/23 0223  NA 144 142  K 4.0 3.9  CL 102 102  CO2 30 30  GLUCOSE 127* 95  BUN 22 25*  CREATININE 3.04* 3.29*  CALCIUM  8.6* 8.8*   GFR: Estimated Creatinine Clearance: 15.8 mL/min (A) (by C-G formula based on SCr of 3.29 mg/dL (H)). CBG: Recent Labs  Lab 12/29/23 2227 12/30/23 0311 12/30/23 0750 12/30/23 1126  GLUCAP 105* 92 115* 72   Scheduled Meds:  busPIRone   7.5 mg Oral BID   insulin  aspart  0-6 Units Subcutaneous Q4H   metoprolol  succinate  50 mg Oral Daily   pantoprazole   40 mg Oral Daily   sertraline   75 mg Oral Daily   sodium chloride  flush  3 mL Intravenous Q12H   Continuous Infusions:  [START ON 12/31/2023]  ceFAZolin  (ANCEF ) IV       LOS: 0 days   Time spent: 60 minutes  Camellia Door, DO  [facility]  12/30/2023, 12:09 PM

## 2023-12-30 NOTE — Progress Notes (Signed)
 Pt receives out-pt HD at [facility], MWF 0700 chair time. Clinic informed of pt arrival. Will assist as needed.    [facility] Manoj Enriquez Dialysis Navigator [phone]

## 2023-12-30 NOTE — Assessment & Plan Note (Addendum)
 12/30/23 wound care consult. Follows with hand surgery.      12/31/23 stable. Continue wound care.

## 2023-12-30 NOTE — Assessment & Plan Note (Addendum)
 12/30/23 holding Eliquis  due to GI bleeding. Continue Toprol -XL  12/31/23 dtr states that GI bleeding did not start until after pt was placed on Eliquis . Discussed that pt may not be able to tolerate systemic anticoagulation and she may only be on ASA due to recurrent GI bleeding.

## 2023-12-30 NOTE — Assessment & Plan Note (Addendum)
 12/30/23 monitor HgB. Transfuse as needed to keep HgB >= 7.0  12/31/23 repeat HgB today. I question if her HgB of 6.3 today was accurate as it was being drawn while pt was getting IVF bolus.

## 2023-12-30 NOTE — Assessment & Plan Note (Addendum)
 12/30/23  continue Toprol -XL  12/31/23 stable.

## 2023-12-30 NOTE — TOC CM/SW Note (Signed)
 Transition of Care [facility]) - Inpatient Brief Assessment   Patient Details  Name: Kathryn Beck MRN: [id number] Date of Birth: 1955-09-04  Transition of Care [facility]) CM/SW Contact:    Lucie Lunger, LCSWA Phone Number: 12/30/2023, 9:18 AM   Clinical Narrative: Transition of Care Department [facility]) has reviewed patient and no TOC needs have been identified at this time. We will continue to monitor patient advancement through interdiciplinary progression rounds. If new patient transition needs arise, please place a TOC consult.  Transition of Care Asessment: Insurance and Status: Insurance coverage has been reviewed Patient has primary care physician: Yes Home environment has been reviewed: From home Prior level of function:: Family assistance Prior/Current Home Services: No current home services Social Drivers of Health Review: SDOH reviewed no interventions necessary Readmission risk has been reviewed: Yes Transition of care needs: no transition of care needs at this time

## 2023-12-30 NOTE — Assessment & Plan Note (Addendum)
 12/30/23 stable  12/31/23 stable.

## 2023-12-30 NOTE — Hospital Course (Addendum)
 68 y.o. female with medical history significant for hypertension, hyperlipidemia, type 2 diabetes mellitus, atrial fibrillation on Eliquis , PAD with bilateral BKA, memory loss, ESRD, finger gangrene status post I&D currently on cefazolin  with dialysis, and recent admissions for GI bleeding who presents to the emergency department with rectal bleeding.   Patient is accompanied by her husband and daughter who assisted with the history.  She was noted to have dark red blood in her stool this morning.  Her Eliquis  was held this morning in light of this.  She had dialysis today and following that, was noted to have dark red blood from her rectum without any stool.  The patient denies any pain, nausea, or vomiting.   She had been admitted to the hospital in September with GI bleeding that was suspected to be diverticular.  She had polypectomy performed and was readmitted with post polypectomy bleeding.  Her Eliquis  had been on hold but was resumed on 12/22/2023 at 2.5 mg twice daily (she was previously on 5 mg twice daily).   ED Course: Upon arrival to the ED, patient is found to be afebrile and saturating well on room air with normal HR and stable BP.  Labs are most notable for normal BUN, creatinine 3.04, normal WBC, and hemoglobin 7.0.

## 2023-12-31 ENCOUNTER — Ambulatory Visit: Admitting: Internal Medicine

## 2023-12-31 DIAGNOSIS — D62 Acute posthemorrhagic anemia: Secondary | ICD-10-CM | POA: Diagnosis not present

## 2023-12-31 DIAGNOSIS — I96 Gangrene, not elsewhere classified: Secondary | ICD-10-CM | POA: Diagnosis not present

## 2023-12-31 DIAGNOSIS — K625 Hemorrhage of anus and rectum: Secondary | ICD-10-CM | POA: Diagnosis not present

## 2023-12-31 DIAGNOSIS — N186 End stage renal disease: Secondary | ICD-10-CM | POA: Diagnosis not present

## 2023-12-31 LAB — HEMOGLOBIN
Hemoglobin: 7.1 g/dL — ABNORMAL LOW (ref 12.0–15.0)
Hemoglobin: 6.3 g/dL — CL (ref 12.0–15.0)

## 2023-12-31 LAB — BASIC METABOLIC PANEL WITH GFR
Anion gap: 15 (ref 5–15)
BUN: 40 mg/dL — ABNORMAL HIGH (ref 8–23)
CO2: 25 mmol/L (ref 22–32)
Calcium: 8.9 mg/dL (ref 8.9–10.3)
Chloride: 101 mmol/L (ref 98–111)
Creatinine, Ser: 4.46 mg/dL — ABNORMAL HIGH (ref 0.44–1.00)
GFR, Estimated: 10 mL/min — ABNORMAL LOW (ref >60)
Glucose, Bld: 72 mg/dL (ref 70–99)
Potassium: 5.4 mmol/L — ABNORMAL HIGH (ref 3.5–5.1)
Sodium: 141 mmol/L (ref 135–145)

## 2023-12-31 LAB — HEMOGLOBIN AND HEMATOCRIT, BLOOD
HCT: 20 % — ABNORMAL LOW (ref 36.0–46.0)
Hemoglobin: 6.1 g/dL — CL (ref 12.0–15.0)

## 2023-12-31 LAB — PREPARE RBC (CROSSMATCH)

## 2023-12-31 LAB — GLUCOSE, CAPILLARY
Glucose-Capillary: 119 mg/dL — ABNORMAL HIGH (ref 70–99)
Glucose-Capillary: 129 mg/dL — ABNORMAL HIGH (ref 70–99)
Glucose-Capillary: 83 mg/dL (ref 70–99)
Glucose-Capillary: 89 mg/dL (ref 70–99)
Glucose-Capillary: 91 mg/dL (ref 70–99)
Glucose-Capillary: 95 mg/dL (ref 70–99)

## 2023-12-31 LAB — HEMATOCRIT
HCT: 24.2 % — ABNORMAL LOW (ref 36.0–46.0)
HCT: 20.5 % — ABNORMAL LOW (ref 36.0–46.0)

## 2023-12-31 MED ORDER — NA SULFATE-K SULFATE-MG SULF 17.5-3.13-1.6 GM/177ML PO SOLN
0.5000 | Freq: Once | ORAL | Status: AC
  Administered 2023-12-31: 177 mL via ORAL
  Filled 2023-12-31: qty 1

## 2023-12-31 MED ORDER — SODIUM CHLORIDE 0.9% IV SOLUTION: Freq: Once | INTRAVENOUS | Status: DC

## 2023-12-31 NOTE — Progress Notes (Signed)
 PROGRESS NOTE    HAMSINI VERRILLI  FMW:[id number] DOB: Sep 06, 1955 DOA: 12/29/2023 PCP: Antonetta Rollene BRAVO, MD  Subjective: Pt seen and examined. Dtr at bedside. Pt had HD today. Got hypotensive after about 1 L UF removed. Needed to get about 500 ml IVF to support BP.  Pt had 3 bloody stools overnight.  Awaiting GI to see patient. HgB of 6.3 obtained when pt was getting IVF bolus. Repeat HgB pending.   Hospital Course: CC: rectal bleeding HPI:  LOANA SALVAGGIO is a 68 y.o. female with medical history significant for hypertension, hyperlipidemia, type 2 diabetes mellitus, atrial fibrillation on Eliquis , PAD with bilateral BKA, memory loss, ESRD, finger gangrene status post I&D currently on cefazolin  with dialysis, and recent admissions for GI bleeding who presents to the emergency department with rectal bleeding.   Patient is accompanied by her husband and daughter who assisted with the history.  She was noted to have dark red blood in her stool this morning.  Her Eliquis  was held this morning in light of this.  She had dialysis today and following that, was noted to have dark red blood from her rectum without any stool.  The patient denies any pain, nausea, or vomiting.   She had been admitted to the hospital in September with GI bleeding that was suspected to be diverticular.  She had polypectomy performed and was readmitted with post polypectomy bleeding.  Her Eliquis  had been on hold but was resumed on 12/22/2023 at 2.5 mg twice daily (she was previously on 5 mg twice daily).   ED Course: Upon arrival to the ED, patient is found to be afebrile and saturating well on room air with normal HR and stable BP.  Labs are most notable for normal BUN, creatinine 3.04, normal WBC, and hemoglobin 7.0.   Type and screen was performed in the ED.    Significant Events: Admitted 12/29/2023 for rectal bleeding   Admission Labs: WBC 8.1, HgB 7.0, plt 308 Na 144, K 4.0, CO2 of 30, BUN 22, Scr 3.04,  glu 127  Admission Imaging Studies:   Significant Labs:   Significant Imaging Studies:   Antibiotic Therapy: Anti-infectives (From admission, onward)    Start     Dose/Rate Route Frequency Ordered Stop   12/31/23 1200  ceFAZolin  (ANCEF ) IVPB 2g/100 mL premix        2 g 200 mL/hr over 30 Minutes Intravenous Every M-W-F (Hemodialysis) 12/29/23 2129         Procedures:   Consultants: GI nephrology    Assessment and Plan: * Rectal bleeding 12/30/23 GI consulted. Monitor HgB. Holding Eliquis .  12/31/23 had 3 bloody stools overnight. Awaiting GI input. Repeat HgB as her HgB of 6.3 was drawn while pt was getting IVF bolus after HD. Transfuse PRBC if her HgB is really <7.   Dry gangrene (HCC) - left hand 12/30/23 wound care consult. Follows with hand surgery.      12/31/23 stable. Continue wound care.  Acute blood loss anemia 12/30/23 monitor HgB. Transfuse as needed to keep HgB >= 7.0  12/31/23 repeat HgB today. I question if her HgB of 6.3 today was accurate as it was being drawn while pt was getting IVF bolus.   PAF (paroxysmal atrial fibrillation) (HCC) 12/30/23 holding Eliquis  due to GI bleeding. Continue Toprol -XL  12/31/23 dtr states that GI bleeding did not start until after pt was placed on Eliquis . Discussed that pt may not be able to tolerate systemic anticoagulation and she may only be  on ASA due to recurrent GI bleeding.   ESRD on hemodialysis (HCC) 12/30/23 nephrology consulted for routine HD.  12/31/23 continue routine HD. Got hypotensive today. Only 500 ml UF removed.   Uncontrolled type 2 diabetes mellitus with hyperglycemia (HCC) 12/30/23 on SSI.  12/31/23 stable CBG   PAD (peripheral artery disease) 12/30/23 continue ntg patch to left wrist.  12/31/23 continue NTG patch to left wrist.   HTN (hypertension) 12/30/23  continue Toprol -XL  12/31/23 stable.   OSA (obstructive sleep apnea) 12/30/23 stable  12/31/23  stable.   DVT prophylaxis: SCDs Start: 12/29/23 2115    Code Status: Full Code Family Communication: discussed with pt's dtr at bedside Disposition Plan: return home Reason for continuing need for hospitalization: awaiting GI input into pt's GI bleeding.  Objective: Vitals:   12/31/23 1200 12/31/23 1215 12/31/23 1230 12/31/23 1447  BP: (!) 165/59 135/81 (!) 143/52 (!) 144/60  Pulse:   (!) 51 63  Resp: (!) 24 (!) 24 20 19   Temp:   (!) 97.5 F (36.4 C) (!) 97.5 F (36.4 C)  TempSrc:   Oral Oral  SpO2: 100% 100% 100% 100%  Weight:   69.4 kg   Height:        Intake/Output Summary (Last 24 hours) at 12/31/2023 1530 Last data filed at 12/31/2023 1200 Gross per 24 hour  Intake 120 ml  Output 500 ml  Net -380 ml   Filed Weights   12/29/23 1754 12/31/23 0926 12/31/23 1230  Weight: 69.2 kg 70 kg 69.4 kg    Examination:  Physical Exam Vitals and nursing note reviewed.  Constitutional:      Comments: sleeping  HENT:     Head: Normocephalic and atraumatic.  Cardiovascular:     Rate and Rhythm: Normal rate and regular rhythm.     Heart sounds: Murmur heard.  Pulmonary:     Effort: Pulmonary effort is normal. No respiratory distress.     Breath sounds: Normal breath sounds.  Abdominal:     General: Bowel sounds are normal. There is no distension.     Palpations: Abdomen is soft.  Musculoskeletal:     Comments: Bilateral AKA  Skin:    General: Skin is warm and dry.  Neurological:     Comments: sleeping     Data Reviewed: I have personally reviewed following labs and imaging studies  CBC: Recent Labs  Lab 12/29/23 1842 12/30/23 0223 12/30/23 1030 12/30/23 1433 12/30/23 1726 12/31/23 0250 12/31/23 1328  WBC 8.1  --   --   --   --   --   --   HGB 7.0*   < > 7.2* 7.0* 7.7* 7.1* 6.3*  HCT 23.3*   < > 23.6* 23.1* 25.5* 24.2* 20.5*  MCV 87.9  --   --   --   --   --   --   PLT 308  --   --   --   --   --   --    < > = values in this interval not displayed.    Basic Metabolic Panel: Recent Labs  Lab 12/29/23 1842 12/30/23 0223 12/31/23 0250  NA 144 142 141  K 4.0 3.9 5.4*  CL 102 102 101  CO2 30 30 25   GLUCOSE 127* 95 72  BUN 22 25* 40*  CREATININE 3.04* 3.29* 4.46*  CALCIUM  8.6* 8.8* 8.9   GFR: Estimated Creatinine Clearance: 11.7 mL/min (A) (by C-G formula based on SCr of 4.46 mg/dL (H)).  CBG: Recent  Labs  Lab 12/30/23 1930 12/31/23 0002 12/31/23 0441 12/31/23 0806 12/31/23 1154  GLUCAP 113* 83 91 89 95   Scheduled Meds:  busPIRone   7.5 mg Oral BID   insulin  aspart  0-6 Units Subcutaneous Q4H   metoprolol  succinate  50 mg Oral Daily   pantoprazole   40 mg Oral Daily   sertraline   75 mg Oral Daily   sodium chloride  flush  3 mL Intravenous Q12H   Continuous Infusions:   ceFAZolin  (ANCEF ) IV       LOS: 0 days   Time spent: 55 minutes  Camellia Door, DO  [facility]  12/31/2023, 3:30 PM

## 2023-12-31 NOTE — Progress Notes (Signed)
   Repeat HgB of 6.1 g/dl. Will transfuse 1 unit PRBc.  Camellia Door, DO [facility]

## 2023-12-31 NOTE — Care Management Obs Status (Signed)
 MEDICARE OBSERVATION STATUS NOTIFICATION   Patient Details  Name: Kathryn Beck MRN: [id number] Date of Birth: 08/08/1955   Medicare Observation Status Notification Given:  Yes    [name] 12/31/2023, 10:30 AM

## 2023-12-31 NOTE — Progress Notes (Signed)
 Patient had 3 bloody w/clots stools overnight. Patient is complaining of incontinence.

## 2023-12-31 NOTE — Progress Notes (Signed)
   Discussed case with Dr. Cindie. Discussed with pt and dtr. Proceed with colonoscopy prep tonight and colonoscopy tomorrow. NPO after MN.  Camellia Door, DO [facility]

## 2023-12-31 NOTE — Progress Notes (Signed)
 Admit: 12/29/2023 LOS: 0  31F ESRD MWF here with lower GI bleeding, recurrent with worsening anemia   Subjective:  About to start dialysis, no interval events Hemoglobin reviewed overnight, stable at 7.1 A.m. RFP reviewed, K5.4 others stable  No intake/output data recorded.  Filed Weights   12/29/23 1754 12/31/23 0926  Weight: 69.2 kg 70 kg    Scheduled Meds:  busPIRone   7.5 mg Oral BID   insulin  aspart  0-6 Units Subcutaneous Q4H   metoprolol  succinate  50 mg Oral Daily   pantoprazole   40 mg Oral Daily   sertraline   75 mg Oral Daily   sodium chloride  flush  3 mL Intravenous Q12H   Continuous Infusions:   ceFAZolin  (ANCEF ) IV     PRN Meds:.acetaminophen  **OR** acetaminophen , prochlorperazine   Current Labs: reviewed   Physical Exam:  Blood pressure (!) 148/54, pulse (!) 58, temperature 98 F (36.7 C), temperature source Oral, resp. rate (!) 0, height 5' 4 (1.626 m), weight 70 kg, SpO2 100%. GEN: Chronically ill-appearing, NAD ENT: NCAT CV: Regular, normal S1 and S2, no rub PULM: Clear bilaterally ABD: Soft, nontender SKIN: Left hand not examined, images noted from hospitalist progress note today EXT: Right BKA, not edematous  A ABLA with lower GI bleeding; complicated by anemia of CKD; stable hemoglobin overnight; GI following and considering colonoscopy ESRD on HD Monday Wednesday Friday, [facility] CKD-BMD calcium  stable with no current issues Hypertension/volume: No evidence of overload, blood pressures are stable. Gangrenous left hand followed by hand surgery on outpatient antibiotics  Atrial fibrillation on outpatient apixaban , currently held DM2 PAD CT abdomen 10/8 with findings suggestive of renal cell carcinoma Hyperkalemia, mild  P Dialysis today on schedule As before I think that shared decision making regarding ESA therapy sensible here.  Largely this will be an outpatient issue as that is where most ESA is given.  I defer therefore to the  outpatient nephrology service.  Transfusion as necessary for anemia at the current time. Medication Issues; Preferred narcotic agents for pain control are hydromorphone , fentanyl , and methadone . Morphine  should not be used.  Baclofen should be avoided Avoid oral sodium phosphate  and magnesium  citrate based laxatives / bowel preps    Bernardino Gasman MD 12/31/2023, 10:14 AM  Recent Labs  Lab 12/29/23 1842 12/30/23 0223 12/31/23 0250  NA 144 142 141  K 4.0 3.9 5.4*  CL 102 102 101  CO2 30 30 25   GLUCOSE 127* 95 72  BUN 22 25* 40*  CREATININE 3.04* 3.29* 4.46*  CALCIUM  8.6* 8.8* 8.9   Recent Labs  Lab 12/29/23 1842 12/30/23 0223 12/30/23 1433 12/30/23 1726 12/31/23 0250  WBC 8.1  --   --   --   --   HGB 7.0*   < > 7.0* 7.7* 7.1*  HCT 23.3*   < > 23.1* 25.5* 24.2*  MCV 87.9  --   --   --   --   PLT 308  --   --   --   --    < > = values in this interval not displayed.

## 2023-12-31 NOTE — Procedures (Signed)
 Received patient in bed to unit.  Alert and oriented.  Informed consent signed and in chart.  LUA AVF cannulated x 2 with 15g needles per policy, without difficulty. Secured well with tape. Tx initiated per MD order. Dr Marlee at bedside.  TX duration:2 hours 4 mins  Pt was hypotensive, responded well to interventions. Request to end tx. C/O nausea. Dr Marlee notified. Tx terminated. Blood returned. Needles removed, sites held x 2 until hemostasis achieved. Gauze changed prior to taping.  Transported back to the room  Alert, without acute distress.  Hand-off given to patient's nurse.   Access used: LUA AVF Access issues: bnone  Total UF removed: 500 ml Medication(s) given: See MAR   Powell LITTIE [facility]

## 2024-01-01 ENCOUNTER — Encounter (HOSPITAL_COMMUNITY)
Admission: EM | Disposition: A | Discharge status: Home Health | Payer: Self-pay | Source: Home / Self Care | Attending: Internal Medicine

## 2024-01-01 ENCOUNTER — Observation Stay (HOSPITAL_COMMUNITY)

## 2024-01-01 ENCOUNTER — Encounter (HOSPITAL_COMMUNITY): Payer: Self-pay | Admitting: Family Medicine

## 2024-01-01 DIAGNOSIS — I5032 Chronic diastolic (congestive) heart failure: Secondary | ICD-10-CM | POA: Diagnosis present

## 2024-01-01 DIAGNOSIS — I480 Paroxysmal atrial fibrillation: Secondary | ICD-10-CM

## 2024-01-01 DIAGNOSIS — I10 Essential (primary) hypertension: Secondary | ICD-10-CM | POA: Diagnosis not present

## 2024-01-01 DIAGNOSIS — W449XXA Unspecified foreign body entering into or through a natural orifice, initial encounter: Secondary | ICD-10-CM | POA: Diagnosis present

## 2024-01-01 DIAGNOSIS — G4733 Obstructive sleep apnea (adult) (pediatric): Secondary | ICD-10-CM | POA: Diagnosis not present

## 2024-01-01 DIAGNOSIS — T184XXA Foreign body in colon, initial encounter: Secondary | ICD-10-CM | POA: Diagnosis not present

## 2024-01-01 DIAGNOSIS — F32A Depression, unspecified: Secondary | ICD-10-CM | POA: Diagnosis present

## 2024-01-01 DIAGNOSIS — Z89512 Acquired absence of left leg below knee: Secondary | ICD-10-CM | POA: Diagnosis not present

## 2024-01-01 DIAGNOSIS — R413 Other amnesia: Secondary | ICD-10-CM | POA: Diagnosis present

## 2024-01-01 DIAGNOSIS — G4730 Sleep apnea, unspecified: Secondary | ICD-10-CM | POA: Diagnosis not present

## 2024-01-01 DIAGNOSIS — K625 Hemorrhage of anus and rectum: Secondary | ICD-10-CM | POA: Diagnosis not present

## 2024-01-01 DIAGNOSIS — K633 Ulcer of intestine: Secondary | ICD-10-CM | POA: Diagnosis not present

## 2024-01-01 DIAGNOSIS — I96 Gangrene, not elsewhere classified: Secondary | ICD-10-CM | POA: Diagnosis not present

## 2024-01-01 DIAGNOSIS — I350 Nonrheumatic aortic (valve) stenosis: Secondary | ICD-10-CM | POA: Diagnosis present

## 2024-01-01 DIAGNOSIS — K648 Other hemorrhoids: Secondary | ICD-10-CM | POA: Diagnosis present

## 2024-01-01 DIAGNOSIS — K219 Gastro-esophageal reflux disease without esophagitis: Secondary | ICD-10-CM | POA: Diagnosis present

## 2024-01-01 DIAGNOSIS — D631 Anemia in chronic kidney disease: Secondary | ICD-10-CM | POA: Diagnosis present

## 2024-01-01 DIAGNOSIS — E1165 Type 2 diabetes mellitus with hyperglycemia: Secondary | ICD-10-CM | POA: Diagnosis not present

## 2024-01-01 DIAGNOSIS — K5731 Diverticulosis of large intestine without perforation or abscess with bleeding: Secondary | ICD-10-CM | POA: Diagnosis present

## 2024-01-01 DIAGNOSIS — Z743 Need for continuous supervision: Secondary | ICD-10-CM | POA: Diagnosis not present

## 2024-01-01 DIAGNOSIS — Z89511 Acquired absence of right leg below knee: Secondary | ICD-10-CM | POA: Diagnosis not present

## 2024-01-01 DIAGNOSIS — I959 Hypotension, unspecified: Secondary | ICD-10-CM | POA: Diagnosis present

## 2024-01-01 DIAGNOSIS — E1122 Type 2 diabetes mellitus with diabetic chronic kidney disease: Secondary | ICD-10-CM | POA: Diagnosis present

## 2024-01-01 DIAGNOSIS — Z992 Dependence on renal dialysis: Secondary | ICD-10-CM | POA: Diagnosis not present

## 2024-01-01 DIAGNOSIS — R531 Weakness: Secondary | ICD-10-CM | POA: Diagnosis not present

## 2024-01-01 DIAGNOSIS — E669 Obesity, unspecified: Secondary | ICD-10-CM | POA: Diagnosis present

## 2024-01-01 DIAGNOSIS — N186 End stage renal disease: Secondary | ICD-10-CM | POA: Diagnosis not present

## 2024-01-01 DIAGNOSIS — D62 Acute posthemorrhagic anemia: Secondary | ICD-10-CM | POA: Diagnosis not present

## 2024-01-01 DIAGNOSIS — E1152 Type 2 diabetes mellitus with diabetic peripheral angiopathy with gangrene: Secondary | ICD-10-CM | POA: Diagnosis present

## 2024-01-01 DIAGNOSIS — L089 Local infection of the skin and subcutaneous tissue, unspecified: Secondary | ICD-10-CM | POA: Diagnosis present

## 2024-01-01 DIAGNOSIS — K254 Chronic or unspecified gastric ulcer with hemorrhage: Secondary | ICD-10-CM | POA: Diagnosis present

## 2024-01-01 DIAGNOSIS — Z794 Long term (current) use of insulin: Secondary | ICD-10-CM | POA: Diagnosis not present

## 2024-01-01 DIAGNOSIS — Z8616 Personal history of COVID-19: Secondary | ICD-10-CM | POA: Diagnosis not present

## 2024-01-01 DIAGNOSIS — I132 Hypertensive heart and chronic kidney disease with heart failure and with stage 5 chronic kidney disease, or end stage renal disease: Secondary | ICD-10-CM | POA: Diagnosis present

## 2024-01-01 HISTORY — PX: COLONOSCOPY: SHX5424

## 2024-01-01 LAB — BPAM RBC
Blood Product Expiration Date: 202511092359
ISSUE DATE / TIME: 202510101701
Unit Type and Rh: 1700

## 2024-01-01 LAB — CBC WITH DIFFERENTIAL/PLATELET
Abs Immature Granulocytes: 0.07 K/uL (ref 0.00–0.07)
Basophils Absolute: 0 K/uL (ref 0.0–0.1)
Basophils Relative: 0 %
Eosinophils Absolute: 0 K/uL (ref 0.0–0.5)
Eosinophils Relative: 0 %
HCT: 26.3 % — ABNORMAL LOW (ref 36.0–46.0)
Hemoglobin: 8.5 g/dL — ABNORMAL LOW (ref 12.0–15.0)
Immature Granulocytes: 1 %
Lymphocytes Relative: 11 %
Lymphs Abs: 0.7 K/uL (ref 0.7–4.0)
MCH: 28.1 pg (ref 26.0–34.0)
MCHC: 32.3 g/dL (ref 30.0–36.0)
MCV: 86.8 fL (ref 80.0–100.0)
Monocytes Absolute: 0.5 K/uL (ref 0.1–1.0)
Monocytes Relative: 7 %
Neutro Abs: 5.7 K/uL (ref 1.7–7.7)
Neutrophils Relative %: 81 %
Platelets: 307 K/uL (ref 150–400)
RBC: 3.03 MIL/uL — ABNORMAL LOW (ref 3.87–5.11)
RDW: 16.1 % — ABNORMAL HIGH (ref 11.5–15.5)
WBC: 7 K/uL (ref 4.0–10.5)
nRBC: 0.3 % — ABNORMAL HIGH (ref 0.0–0.2)

## 2024-01-01 LAB — BASIC METABOLIC PANEL WITH GFR
Anion gap: 13 (ref 5–15)
BUN: 29 mg/dL — ABNORMAL HIGH (ref 8–23)
CO2: 28 mmol/L (ref 22–32)
Calcium: 8.9 mg/dL (ref 8.9–10.3)
Chloride: 101 mmol/L (ref 98–111)
Creatinine, Ser: 3.67 mg/dL — ABNORMAL HIGH (ref 0.44–1.00)
GFR, Estimated: 13 mL/min — ABNORMAL LOW (ref >60)
Glucose, Bld: 94 mg/dL (ref 70–99)
Potassium: 4.6 mmol/L (ref 3.5–5.1)
Sodium: 141 mmol/L (ref 135–145)

## 2024-01-01 LAB — TYPE AND SCREEN
ABO/RH(D): B POS
Antibody Screen: NEGATIVE
Unit division: 0

## 2024-01-01 LAB — GLUCOSE, CAPILLARY
Glucose-Capillary: 103 mg/dL — ABNORMAL HIGH (ref 70–99)
Glucose-Capillary: 106 mg/dL — ABNORMAL HIGH (ref 70–99)
Glucose-Capillary: 236 mg/dL — ABNORMAL HIGH (ref 70–99)
Glucose-Capillary: 262 mg/dL — ABNORMAL HIGH (ref 70–99)
Glucose-Capillary: 294 mg/dL — ABNORMAL HIGH (ref 70–99)
Glucose-Capillary: 79 mg/dL (ref 70–99)
Glucose-Capillary: 82 mg/dL (ref 70–99)
Glucose-Capillary: 90 mg/dL (ref 70–99)

## 2024-01-01 SURGERY — COLONOSCOPY
Anesthesia: Monitor Anesthesia Care

## 2024-01-01 MED ORDER — FENTANYL CITRATE (PF) 100 MCG/2ML IJ SOLN
INTRAMUSCULAR | Status: AC
  Filled 2024-01-01: qty 2

## 2024-01-01 MED ORDER — MIDAZOLAM HCL 2 MG/2ML IJ SOLN
INTRAMUSCULAR | Status: AC
  Filled 2024-01-01: qty 2

## 2024-01-01 MED ORDER — ONDANSETRON HCL 4 MG/2ML IJ SOLN: 4.0000 mg | Freq: Once | INTRAMUSCULAR | Status: DC | PRN

## 2024-01-01 MED ORDER — PROPOFOL 500 MG/50ML IV EMUL
INTRAVENOUS | Status: DC | PRN
Start: 2024-01-01 — End: 2024-01-01
  Administered 2024-01-01: 20 mg via INTRAVENOUS

## 2024-01-01 MED ORDER — FENTANYL CITRATE (PF) 100 MCG/2ML IJ SOLN
INTRAMUSCULAR | Status: DC | PRN
  Administered 2024-01-01 (×2): 50 ug via INTRAVENOUS
  Administered 2024-01-01: 25 ug via INTRAVENOUS

## 2024-01-01 MED ORDER — ONDANSETRON HCL 4 MG/2ML IJ SOLN
4.0000 mg | Freq: Once | INTRAMUSCULAR | Status: AC
  Administered 2024-01-01: 4 mg via INTRAVENOUS

## 2024-01-01 MED ORDER — ONDANSETRON HCL 4 MG/2ML IJ SOLN
INTRAMUSCULAR | Status: AC
  Filled 2024-01-01: qty 2

## 2024-01-01 MED ORDER — MIDAZOLAM HCL 2 MG/2ML IJ SOLN
INTRAMUSCULAR | Status: DC | PRN
Start: 2024-01-01 — End: 2024-01-01
  Administered 2024-01-01: 2 mg via INTRAVENOUS

## 2024-01-01 MED ORDER — ONDANSETRON HCL 4 MG/2ML IJ SOLN
INTRAMUSCULAR | Status: DC | PRN
  Administered 2024-01-01: 4 mg via INTRAVENOUS

## 2024-01-01 NOTE — H&P (Signed)
 Primary Care Physician:  Antonetta Rollene BRAVO, MD Primary Gastroenterologist:  Dr. Cindie  Pre-Procedure History & Physical: HPI:  Kathryn Beck is a 68 y.o. female is here for a colonoscopy to be performed for acute blood loss anemia, rectal bleeding.   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  Last Assessment & Plan:   Multiple polyps with surveillance due in 2021.  IMO SNOMED Dx Update Oct 2024     History of pneumonia 2019   Hypoxia 04/03/2019   Memory loss    Mixed hyperlipidemia    Not currently working due to disabled status 06/13/2018   Formatting of this note might be different from the original.  Last Assessment & Plan:   Formatting of this note might be different from the original.  Reports being  disabled since April/May 2019 , hospitalized then in a niursing home, now on dialysis and is unable to work     Obesity    Pancreatitis    Peritoneal dialysis catheter in place    Pneumonia due to COVID-19 virus 04/02/2019   Sleep apnea    Noncompliant with CPAP   Stroke (HCC)    mini stroke   Type 2 diabetes mellitus (HCC)     Past Surgical History:  Procedure Laterality Date    ABDOMINAL AORTOGRAM W/LOWER EXTREMITY N/A 04/30/2022   Procedure: ABDOMINAL AORTOGRAM W/LOWER EXTREMITY;  Surgeon: Magda Debby SAILOR, MD;  [facility]: [facility];  Service: Cardiovascular;  Laterality: N/A;   ABDOMINAL AORTOGRAM W/LOWER EXTREMITY N/A 07/21/2022   Procedure: ABDOMINAL AORTOGRAM W/LOWER EXTREMITY;  Surgeon: Serene Gaile ORN, MD;  Location: [facility];  Service: Cardiovascular;  Laterality: N/A;   ABDOMINAL HYSTERECTOMY     ACHILLES TENDON LENGTHENING  08/15/2022   Procedure: ACHILLES TENDON LENGTHENING;  Surgeon: Silva Juliene SAUNDERS, DPM;  Location: [facility] OR;  Service: Podiatry;;   AMPUTATION Right 05/29/2022   Procedure: RIGHT BELOW THE KNEE AMPUTATION;  Surgeon: Harden Jerona GAILS, MD;  Location: [facility] OR;  Service: Orthopedics;  Laterality: Right;   AMPUTATION Left 09/04/2022   Procedure: AMPUTATION FOOT, serial irrigation;  Surgeon: Joya Stabs, DPM;  Location: [facility] OR;  Service: Podiatry;  Laterality: Left;  Surgical team to do block   AMPUTATION Left 10/07/2022   Procedure: LEFT BELOW KNEE AMPUTATION;  Surgeon: Harden Jerona GAILS, MD;  Location: [facility] OR;  Service: Orthopedics;  Laterality: Left;   AMPUTATION FINGER Left 09/29/2023   Procedure: AMPUTATION, FINGER;  Surgeon: Harden Jerona GAILS, MD;  Location: [facility] OR;  Service: Orthopedics;  Laterality:  Left;  LEFT HAND LONG FINGER RAY AMPUTATION   AV FISTULA PLACEMENT Left 09/02/2017   Procedure: creation of left arm ARTERIOVENOUS (AV) FISTULA;  Surgeon: Serene Gaile ORN, MD;  Location: [facility] OR;  Service: Vascular;  Laterality: Left;   COLONOSCOPY  2008   Dr. Harvey: normal    COLONOSCOPY N/A 12/18/2016   Dr. Harvey: multiple tubular adenomas, internal hemorrhoids. Surveillance in 3 years    COLONOSCOPY N/A 12/05/2023   Procedure: COLONOSCOPY;  Surgeon: Cindie Carlin POUR, DO;  [facility]: [facility];  Service: Endoscopy;  Laterality: N/A;   COLONOSCOPY N/A 12/17/2023   Procedure: COLONOSCOPY;  Surgeon: Shaaron Lamar HERO, MD;  Location: [facility];  Service: Endoscopy;  Laterality: N/A;   ESOPHAGEAL DILATION N/A 10/13/2015   Procedure: ESOPHAGEAL DILATION;  Surgeon: Claudis RAYMOND Rivet, MD;  [facility]: [facility];  Service: Endoscopy;  Laterality: N/A;   ESOPHAGEAL DILATION N/A 12/03/2023   Procedure: DILATION, ESOPHAGUS;  Surgeon: Cindie Carlin POUR, DO;  [facility]: [facility];  Service: Endoscopy;  Laterality: N/A;   ESOPHAGOGASTRODUODENOSCOPY N/A 10/13/2015   Dr. Rivet: chronic gastritis on path, no H.pylori. Empiric dilation    ESOPHAGOGASTRODUODENOSCOPY N/A 12/18/2016   Dr. Harvey: mild gastritis. BRAVO study revealed uncontrolled GERD. Dysphagia secondary to uncontrolled reflux   ESOPHAGOGASTRODUODENOSCOPY N/A 12/03/2023   Procedure: EGD (ESOPHAGOGASTRODUODENOSCOPY);  Surgeon: Cindie Carlin POUR, DO;  [facility];  Service: Endoscopy;  Laterality: N/A;   FOOT SURGERY Bilateral    nerve     INCISION AND DRAINAGE OF WOUND Left 10/30/2023   Procedure: IRRIGATION AND DEBRIDEMENT WOUND;  Surgeon: Arlinda Buster, MD;  Location: [facility] OR;  Service: Orthopedics;  Laterality: Left;  LEFT HAND WOUND   LEFT HEART CATH AND CORONARY ANGIOGRAPHY N/A 12/29/2018   Procedure: LEFT HEART CATH AND CORONARY ANGIOGRAPHY;  Surgeon: Dann Candyce RAMAN, MD;  Location[facility] INVAS[facility] CV LAB;  Service: Cardiovascular;  Laterality: N/A;   LOWER EXTREMITY ANGIOGRAPHY Right 05/04/2022   Procedure: Lower Extremity Angiography;  Surgeon: Lanis Fonda BRAVO, MD;  Location: [facility] INVASIVE CV LAB;  Service: Cardiovascular;  Laterality: Right;   LUNG BIOPSY     MASS EXCISION Right 01/09/2013   Procedure: EXCISION OF NEOPLASM OF RIGHT  AXILLA  AND EXCISION OF NEOPLASM OF LEFT AXILLA;  Surgeon: Oneil DELENA Budge, MD;  Location: [facility] ORS;  Service: General;  Laterality: Right;  procedure end @ 08:23   MYRINGOTOMY WITH TUBE PLACEMENT Bilateral 04/28/2017   Procedure: BILATERAL MYRINGOTOMY WITH TUBE PLACEMENT;  Surgeon: Karis Clunes, MD;  Location: [facility] OR;  Service: ENT;   Laterality: Bilateral;   PERIPHERAL VASCULAR BALLOON ANGIOPLASTY Right 05/04/2022   Procedure: PERIPHERAL VASCULAR BALLOON ANGIOPLASTY;  Surgeon: Lanis Fonda BRAVO, MD;  Location: [facility] INVASIVE CV LAB;  Service: Cardiovascular;  Laterality: Right;  PT   PERIPHERAL VASCULAR INTERVENTION Right 05/04/2022   Procedure: PERIPHERAL VASCULAR INTERVENTION;  Surgeon: Lanis Fonda BRAVO, MD;  Location: [facility] INVASIVE CV LAB;  Service: Cardiovascular;  Laterality: Right;  SFA   PERIPHERAL VASCULAR INTERVENTION Left 07/21/2022   Procedure: PERIPHERAL VASCULAR INTERVENTION;  Surgeon: Serene Gaile ORN, MD;  [facility];  Service: Cardiovascular;  Laterality: Left;   REVISION OF ARTERIOVENOUS GORETEX GRAFT Left 05/04/2018   Procedure: TRANSPOSITION OF CEPHALIC VEIN ARTERIOVENOUS FISTULA LEFT ARM;  Surgeon: Oris Krystal FALCON, MD;  Location: [facility] OR;  Service: Vascular;  Laterality: Left;   SAVORY DILATION N/A 12/18/2016   Procedure: SAVORY DILATION;  Surgeon: Harvey Margo CROME, MD;  [facility];  Service: Endoscopy;  Laterality: N/A;   TRANSMETATARSAL AMPUTATION Left 08/15/2022   Procedure: TRANSMETATARSAL AMPUTATION;  Surgeon: Silva Juliene SAUNDERS, DPM;  Location: [facility] OR;  Service: Podiatry;  Laterality: Left;    Prior to Admission medications   Medication Sig Start Date End Date Taking? Authorizing Provider  acetaminophen  (TYLENOL ) 500 MG tablet Take 1,000 mg by mouth every 6 (six) hours as needed for mild pain (pain score 1-3).   Yes [provider]  amLODipine  (NORVASC ) 10 MG tablet Take 0.5 tablets (5 mg total) by mouth daily. 10/28/23  Yes Emokpae, Courage, MD  apixaban  (ELIQUIS ) 2.5 MG TABS tablet Take 1 tablet (2.5 mg total) by mouth 2 (two) times daily. 12/22/23  Yes Shah, Pratik D, DO  B Complex-C-Folic Acid  (RENA-VITE RX) 1 MG TABS Take 1 tablet by mouth daily. 04/15/22  Yes [provider]  busPIRone  (BUSPAR ) 7.5 MG tablet Take 1 tablet (7.5 mg total) by mouth 2 (two) times daily.  11/24/23  Yes Antonetta Rollene BRAVO, MD  calcitRIOL  (ROCALTROL ) 0.5 MCG capsule Take 2 capsules (1 mcg total) by mouth every Monday, Wednesday, and Friday with hemodialysis. 11/05/23  Yes Amin, Ankit C, MD  CEFAZOLIN  SODIUM IV Inject into the vein every Monday, Wednesday, and Friday with hemodialysis. 11/30/23 01/11/24 Yes [provider]  cinacalcet  (SENSIPAR ) 30 MG tablet Take 3 tablets (90 mg total) by mouth every Monday, Wednesday, and Friday. 12/08/23  Yes Johnson, Clanford L, MD  ezetimibe  (ZETIA ) 10 MG tablet Take 1 tablet (10 mg total) by mouth daily. 04/27/23  Yes Antonetta Rollene BRAVO, MD  insulin  aspart (NOVOLOG ) 100 UNIT/ML injection Inject 0-6 Units into the skin 3 (three) times daily with meals. CBG 70 - 120: 0 units  CBG 121 - 150: 0 units  CBG 151 - 200: 1 unit  CBG 201-250: 2 units  CBG 251-300: 3 units  CBG 301-350: 4 units  CBG 351-400: 5 units  CBG > 400: Give 10 units and call MD 11/12/23  Yes Shahmehdi, Seyed A, MD  insulin  glargine, 2 Unit Dial , (TOUJEO  MAX SOLOSTAR) 300 UNIT/ML Solostar Pen Inject 30 Units into the skin daily. May need to slowly increase the dose depending upon your blood sugar, follow-up with PCP 01/01/23  Yes Trixie File, MD  metoprolol  succinate (TOPROL -XL) 50 MG 24 hr tablet TAKE ONE TABLET BY MOUTH ONCE DAILY WITH FOOD (TAKE AFTER DIALYSIS) Patient taking differently: Take 50 mg by mouth See admin instructions. On Non-dialysis days -Tues,Thurs,Sat and Sun. 12/20/23  Yes Antonetta Rollene BRAVO, MD  nitroGLYCERIN  (NITRODUR - DOSED IN MG/24 HR) 0.2 mg/hr patch Place 1 patch (0.2 mg total) onto the skin daily. 12/28/23  Yes Agarwala, Anshul, MD  ondansetron  (ZOFRAN ) 4 MG tablet Take 4 mg by mouth every 8 (eight) hours as needed for nausea or vomiting. 11/26/23  Yes [provider]  pantoprazole  (PROTONIX ) 40 MG tablet Take 1 tablet (40 mg total) by mouth daily. 12/08/23  Yes Johnson, Clanford L, MD  sertraline  (ZOLOFT ) 50 MG tablet Take one and a half  tablets once daily Patient taking differently: Take 75 mg by mouth daily. Take one and a half tablets once daily 11/24/23  Yes Antonetta Rollene BRAVO, MD  torsemide  (DEMADEX ) 100 MG tablet Take 1 tablet (100 mg total) by mouth every Tuesday, Thursday, Saturday, and Sunday. Patient taking differently: Take 100 mg by mouth every Tuesday, Thursday, Saturday, and Sunday. Non-dialysis days 10/28/23  Yes Emokpae, Courage, MD  [name]  500 MG chewable tablet Chew 1-2 tablets (500-1,000 mg  total) by mouth See admin instructions. Take 1000mg  (2 tablets) by mouth with meals and 500mg  (1 tablet) with snacks 08/23/23  Yes Antonetta Rollene BRAVO, MD  FLUoxetine (PROZAC) 10 MG capsule Take 10 mg by mouth daily.    05/28/11  [provider]  glipiZIDE  (GLUCOTROL ) 10 MG tablet Take 10 mg by mouth 2 (two) times daily before a meal.    05/28/11  [provider]    Allergies as of 12/29/2023 - Review Complete 12/29/2023  Allergen Reaction Noted   Ace inhibitors Anaphylaxis and Swelling 06/07/2007   Penicillins Itching and Swelling 06/07/2007   Statins Other (See Comments) 12/24/2012   Albuterol  Swelling 08/31/2017    Family History  Problem Relation Age of Onset   Hypertension Father    Hypercholesterolemia Father    Arthritis Father    Hypertension Sister    Hypercholesterolemia Sister    Breast cancer Sister    Hypertension Sister    Colon cancer Neg Hx    Colon polyps Neg Hx     Social History   Socioeconomic History   Marital status: Married    Spouse name: December Hedtke   Number of children: 2   Years of education: 12   Highest education level: Some college, no degree  Occupational History   Occupation: retired   Tobacco Use   Smoking status: Never    Passive exposure: Never   Smokeless tobacco: Never   Tobacco comments:    Verified by Daughter, [name]  Vaping Use   Vaping status: Never Used  Substance and Sexual Activity   Alcohol use: No   Drug use: No   Sexual  activity: Not Currently    Partners: Male  Other Topics Concern   Not on file  Social History Narrative   Lives alone with husband    Right handed   Caffeine-1/2 daily   Social Drivers of Health   Financial Resource Strain: Low Risk  (11/24/2023)   Overall Financial Resource Strain (CARDIA)    Difficulty of Paying Living Expenses: Not hard at all  Food Insecurity: No Food Insecurity (12/30/2023)   Hunger Vital Sign    Worried About Running Out of Food in the Last Year: Never true    Ran Out of Food in the Last Year: Never true  Transportation Needs: No Transportation Needs (12/30/2023)   PRAPARE - Administrator, Civil Service (Medical): No    Lack of Transportation (Non-Medical): No  Physical Activity: Inactive (11/24/2023)   Exercise Vital Sign    Days of Exercise per Week: 0 days    Minutes of Exercise per Session: Not on file  Stress: Stress Concern Present (11/24/2023)   [employer] of Occupational Health - Occupational Stress Questionnaire    Feeling of Stress: To some extent  Social Connections: Moderately Integrated (12/30/2023)   Social Connection and Isolation Panel    Frequency of Communication with Friends and Family: More than three times a week    Frequency of Social Gatherings with Friends and Family: More than three times a week    Attends Religious Services: 1 to 4 times per year    Active Member of [employer] or Organizations: No    Attends Banker Meetings: Never    Marital Status: Married  Recent Concern: Social Connections - Moderately Isolated (11/25/2023)   Social Connection and Isolation Panel    Frequency of Communication with Friends and Family: Three times a week    Frequency of Social Gatherings  with Friends and Family: Three times a week    Attends Religious Services: Patient declined    Active Member of Clubs or Organizations: No    Attends Banker Meetings: Never    Marital Status: Married  Catering manager  Violence: Not At Risk (12/30/2023)   Humiliation, Afraid, Rape, and Kick questionnaire    Fear of Current or Ex-Partner: No    Emotionally Abused: No    Physically Abused: No    Sexually Abused: No    Review of Systems: See HPI, otherwise negative ROS  Physical Exam: Vital signs in last 24 hours: Temp:  [97.5 F (36.4 C)-98.6 F (37 C)] 97.6 F (36.4 C) (10/11 0422) Pulse Rate:  [44-63] 60 (10/11 0422) Resp:  [10-24] 20 (10/11 0422) BP: (88-165)/(44-99) 161/59 (10/11 0422) SpO2:  [97 %-100 %] 99 % (10/11 0422) FiO2 (%):  [21 %] 21 % (10/10 1723) Weight:  [69.4 kg-70 kg] 69.4 kg (10/10 1230) Last BM Date : 01/01/24 General:   Alert,  Well-developed, well-nourished, pleasant and cooperative in NAD Head:  Normocephalic and atraumatic. Eyes:  Sclera clear, no icterus.   Conjunctiva pink. Ears:  Normal auditory acuity. Nose:  No deformity, discharge,  or lesions. Msk:  Symmetrical without gross deformities. Normal posture. Extremities:  Without clubbing or edema. Neurologic:  Alert and  oriented x4;  grossly normal neurologically. Skin:  Intact without significant lesions or rashes. Psych:  Alert and cooperative. Normal mood and affect.  Impression/Plan: ZELL DOUCETTE is here for a colonoscopy to be performed for acute blood loss anemia, rectal bleeding.   The risks of the procedure including infection, bleed, or perforation as well as benefits, limitations, alternatives and imponderables have been reviewed with the patient. Questions have been answered. All parties agreeable.

## 2024-01-01 NOTE — Op Note (Signed)
 [facility] Patient Name: Kathryn Beck Procedure Date: 01/01/2024 8:40 AM MRN: [id number] Date of Birth: 1956-02-01 Attending MD: Carlin POUR. Cindie , [place], [id number] CSN: [id number] Age: 68 Admit Type: Inpatient Procedure:                Colonoscopy Indications:              Rectal bleeding, Acute post hemorrhagic anemia Providers:                Carlin POUR. Cindie, DO, Olam Ada, RN, Gordy Bruckner Tech, Technician Referring MD:              Medicines:                See the Anesthesia note for documentation of the                            administered medications Complications:            No immediate complications. Estimated Blood Loss:     Estimated blood loss was minimal. Procedure:                Pre-Anesthesia Assessment:                           - The anesthesia plan was to use monitored                            anesthesia care (MAC).                           After obtaining informed consent, the colonoscope                            was passed under direct vision. Throughout the                            procedure, the patient's blood pressure, pulse, and                            oxygen  saturations were monitored continuously. The                            PCF-HQ190L ([id number]) Peds Colon was introduced                            through the anus and advanced to the the cecum,                            identified by appendiceal orifice and ileocecal                            valve. The colonoscopy was performed without                            difficulty. The patient tolerated  the procedure                            well. The quality of the bowel preparation was                            evaluated using the BBPS [facility] Bowel Preparation                            Scale) with scores of: Right Colon = 2 (minor                            amount of residual staining, small fragments of                            stool and/or  opaque liquid, but mucosa seen well),                            Transverse Colon = 2 (minor amount of residual                            staining, small fragments of stool and/or opaque                            liquid, but mucosa seen well) and Left Colon = 2                            (minor amount of residual staining, small fragments                            of stool and/or opaque liquid, but mucosa seen                            well). The total BBPS score equals 6. The quality                            of the bowel preparation was good. Scope In: 9:28:52 AM Scope Out: 9:54:20 AM Scope Withdrawal Time: 0 hours 19 minutes 55 seconds  Total Procedure Duration: 0 hours 25 minutes 28 seconds  Findings:      Non-bleeding internal hemorrhoids were found. The hemorrhoids were       moderate.      Scattered large-mouthed and small-mouthed diverticula were found in the       entire colon.      Red blood was found in the descending colon and in the transverse colon.       Lavage of the area was performed using copious amounts of sterile water ,       resulting in clearance with good visualization.      Four 8-12 mm post polypectomy ulcers were found in the descending colon       and in the transverse colon. No active bleeding was present. Unclear       which site was culprit of bleeding after thorough lavage, so all four       ulcers/defects were  closed requiring five hemostatic clips which were       successfully placed (MR safe). Clip manufacturer: [employer].       There was no bleeding at the end of the procedure.      A hemostatic clip was found in the ascending colon. Impression:               - Non-bleeding internal hemorrhoids.                           - Diverticulosis in the entire examined colon.                           - Blood in the descending colon and in the                            transverse colon.                           - 4 ulcers in the descending  colon and in the                            transverse colon. Clip manufacturer: [employer]. Clips (MR safe) were placed.                           - Foreign body in the ascending colon.                           - No specimens collected. Moderate Sedation:      Per Anesthesia Care Recommendation:           - Return patient to hospital ward for ongoing care.                           - Resume regular diet.                           - Hold Eliquis  x2 more days. Resume on reduced dose                            of 2.5 mg BID.                           - Check H&H in the AM. Hopeful discharge tomorrow.                           - I spoke with patient's husband and daughter. All                            questions/concerns answered. Procedure Code(s):        --- Professional ---                           [phone], Colonoscopy, flexible; diagnostic,  including                            collection of specimen(s) by brushing or washing,                            when performed (separate procedure) Diagnosis Code(s):        --- Professional ---                           K64.8, Other hemorrhoids                           K92.2, Gastrointestinal hemorrhage, unspecified                           K63.3, Ulcer of intestine                           T18.4XXA, Foreign body in colon, initial encounter                           K62.5, Hemorrhage of anus and rectum                           D62, Acute posthemorrhagic anemia                           K57.30, Diverticulosis of large intestine without                            perforation or abscess without bleeding CPT copyright 2022 American Medical Association. All rights reserved. The codes documented in this report are preliminary and upon coder review may  be revised to meet current compliance requirements. Carlin POUR. Cindie, DO Carlin POUR. Cindie, DO 01/01/2024 10:24:56 AM This report has been signed  electronically. Number of Addenda: 0

## 2024-01-01 NOTE — Anesthesia Preprocedure Evaluation (Addendum)
 Anesthesia Evaluation  Patient identified by MRN, date of birth, ID band Patient awake    Reviewed: Allergy & Precautions, H&P , NPO status , Patient's Chart, lab work & pertinent test results  Airway Mallampati: III  TM Distance: >3 FB Neck ROM: Full    Dental no notable dental hx.    Pulmonary sleep apnea , pneumonia   Pulmonary exam normal breath sounds clear to auscultation       Cardiovascular hypertension, + Peripheral Vascular Disease and +CHF  Normal cardiovascular exam+ Valvular Problems/Murmurs  Rhythm:Regular Rate:Normal     Neuro/Psych  Headaches PSYCHIATRIC DISORDERS Anxiety Depression     Neuromuscular disease CVA    GI/Hepatic Neg liver ROS,GERD  ,,  Endo/Other  diabetes    Renal/GU Dialysis and ESRFRenal diseaseDialysis MWF  negative genitourinary   Musculoskeletal  (+) Arthritis ,    Abdominal   Peds negative pediatric ROS (+)  Hematology  (+) Blood dyscrasia, anemia   Anesthesia Other Findings   Reproductive/Obstetrics negative OB ROS                              Anesthesia Physical Anesthesia Plan  ASA: 4 and emergent  Anesthesia Plan: MAC   Post-op Pain Management:    Induction:   PONV Risk Score and Plan:   Airway Management Planned: Nasal Cannula  Additional Equipment:   Intra-op Plan:   Post-operative Plan:   Informed Consent: I have reviewed the patients History and Physical, chart, labs and discussed the procedure including the risks, benefits and alternatives for the proposed anesthesia with the patient or authorized representative who has indicated his/her understanding and acceptance.     Dental advisory given  Plan Discussed with: Surgeon  Anesthesia Plan Comments:          Anesthesia Quick Evaluation

## 2024-01-01 NOTE — Progress Notes (Signed)
 Patient did not wear CPAP tonight as she was preparing for morning colonoscopy.

## 2024-01-01 NOTE — Transfer of Care (Signed)
 Immediate Anesthesia Transfer of Care Note  Patient: Kathryn Beck  Procedure(s) Performed: COLONOSCOPY  Patient Location: PACU  Anesthesia Type:MAC  Level of Consciousness: oriented and sedated  Airway & Oxygen  Therapy: Patient Spontanous Breathing  Post-op Assessment: Report given to RN and Post -op Vital signs reviewed and stable  Post vital signs: Reviewed and stable  Last Vitals:  Vitals Value Taken Time  BP 119/52 01/01/24 10:02  Temp 37.1 C 01/01/24 10:00  Pulse 69 01/01/24 10:02  Resp 14 01/01/24 10:07  SpO2 100 % 01/01/24 10:02  Vitals shown include unfiled device data.  Last Pain:  Vitals:   01/01/24 0422  TempSrc: Oral  PainSc:          Complications: No notable events documented.

## 2024-01-01 NOTE — Anesthesia Postprocedure Evaluation (Signed)
 Anesthesia Post Note  Patient: Kathryn Beck  Procedure(s) Performed: COLONOSCOPY  Patient location during evaluation: PACU Anesthesia Type: MAC Level of consciousness: awake and alert Pain management: pain level controlled Vital Signs Assessment: post-procedure vital signs reviewed and stable Respiratory status: spontaneous breathing, nonlabored ventilation, respiratory function stable and patient connected to nasal cannula oxygen  Cardiovascular status: stable and blood pressure returned to baseline Postop Assessment: no apparent nausea or vomiting Anesthetic complications: no   No notable events documented.   Last Vitals:  Vitals:   01/01/24 0422 01/01/24 1000  BP: (!) 161/59 (!) 119/52  Pulse: 60 82  Resp: 20 17  Temp: 36.4 C 37.1 C  SpO2: 99% 99%    Last Pain:  Vitals:   01/01/24 0422  TempSrc: Oral  PainSc:                  Andrea Limes

## 2024-01-01 NOTE — Plan of Care (Signed)

## 2024-01-01 NOTE — Progress Notes (Signed)
 PROGRESS NOTE   Kathryn Beck  FMW:[id number] DOB: [birth year] DOA: 12/29/2023 PCP: Antonetta Rollene BRAVO, MD   Chief Complaint  Patient presents with   Hematochezia   Level of care: Telemetry  Brief Admission History:  68 y.o. female with medical history significant for hypertension, hyperlipidemia, type 2 diabetes mellitus, atrial fibrillation on Eliquis , PAD with bilateral BKA, memory loss, ESRD, finger gangrene status post I&D currently on cefazolin  with dialysis, and recent admissions for GI bleeding who presents to the emergency department with rectal bleeding.   Patient is accompanied by her husband and daughter who assisted with the history.  She was noted to have dark red blood in her stool this morning.  Her Eliquis  was held this morning in light of this.  She had dialysis today and following that, was noted to have dark red blood from her rectum without any stool.  The patient denies any pain, nausea, or vomiting.   She had been admitted to the hospital in September with GI bleeding that was suspected to be diverticular.  She had polypectomy performed and was readmitted with post polypectomy bleeding.  Her Eliquis  had been on hold but was resumed on 12/22/2023 at 2.5 mg twice daily (she was previously on 5 mg twice daily).   ED Course: Upon arrival to the ED, patient is found to be afebrile and saturating well on room air with normal HR and stable BP.  Labs are most notable for normal BUN, creatinine 3.04, normal WBC, and hemoglobin 7.0.  Assessment and Plan:  Rectal bleeding GI consulted. Monitor HgB. Holding Eliquis .  had 3 bloody stools overnight. Awaiting GI input. Repeat HgB as her HgB of 6.3 was drawn while pt was getting IVF bolus after HD. Transfuse PRBC if her HgB is really <7.  Colonoscopy per Dr. Cindie -- hold apixaban  x 2 more days   Acute blood loss anemia monitor HgB. Transfuse as needed to keep HgB >= 7.0   Dry gangrene - left hand 12/30/23 wound care  consult. Follows with hand surgery. stable. Continue wound care.  PAF (paroxysmal atrial fibrillation)  holding Eliquis  due to GI bleeding. Continue Toprol -XL  dtr states that GI bleeding did not start until after pt was placed on Eliquis . Discussed that pt may not be able to tolerate systemic anticoagulation and she may only be on ASA due to recurrent GI bleeding.   Uncontrolled type 2 diabetes mellitus with hyperglycemia   on SSI.  stable CBG   PAD (peripheral artery disease) continue ntg patch to left wrist.  continue NTG patch to left wrist.   ESRD on hemodialysis  nephrology consulted for routine HD.  continue routine HD. Got hypotensive today. Only 500 ml UF removed.   HTN (hypertension)  continue Toprol -XL   stable.   OSA (obstructive sleep apnea)  stable  stable.   DVT prophylaxis:  Code Status:  Family Communication:  Disposition: Status is: Inpatient    Consultants:  GI Procedures:  Colonoscopy 01/01/24  Antimicrobials:    Subjective: Pt seen prior to colonoscopy  Objective: Vitals:   01/01/24 1000 01/01/24 1015 01/01/24 1204 01/01/24 1346  BP: (!) 119/52 (!) 142/58 (!) 169/63 (!) 125/51  Pulse: 82  69 87  Resp: 17 20  18   Temp: 98.8 F (37.1 C)   98.3 F (36.8 C)  TempSrc:      SpO2: 99% 99% 98% 98%  Weight:      Height:        Intake/Output Summary (Last 24 hours)  at 01/01/2024 1659 Last data filed at 12/31/2023 1940 Gross per 24 hour  Intake 500 ml  Output --  Net 500 ml   Filed Weights   12/29/23 1754 12/31/23 0926 12/31/23 1230  Weight: 69.2 kg 70 kg 69.4 kg   Examination:  General exam: Appears calm and comfortable  Respiratory system: Clear to auscultation. Respiratory effort normal. Cardiovascular system: normal S1 & S2 heard. No JVD, murmurs, rubs, gallops or clicks. No pedal edema. Gastrointestinal system: Abdomen is nondistended, soft and nontender. No organomegaly or masses felt. Normal bowel sounds  heard. Central nervous system: Alert and oriented. No focal neurological deficits. Extremities: bilateral BKAs, dry gangrene of LUE  Skin: No rashes, lesions or ulcers. Psychiatry: Judgement and insight appear normal. Mood & affect appropriate.   Data Reviewed: I have personally reviewed following labs and imaging studies  CBC: Recent Labs  Lab 12/29/23 1842 12/30/23 0223 12/30/23 1726 12/31/23 0250 12/31/23 1328 12/31/23 1510 01/01/24 0412  WBC 8.1  --   --   --   --   --  7.0  NEUTROABS  --   --   --   --   --   --  5.7  HGB 7.0*   < > 7.7* 7.1* 6.3* 6.1* 8.5*  HCT 23.3*   < > 25.5* 24.2* 20.5* 20.0* 26.3*  MCV 87.9  --   --   --   --   --  86.8  PLT 308  --   --   --   --   --  307   < > = values in this interval not displayed.    Basic Metabolic Panel: Recent Labs  Lab 12/29/23 1842 12/30/23 0223 12/31/23 0250 01/01/24 0412  NA 144 142 141 141  K 4.0 3.9 5.4* 4.6  CL 102 102 101 101  CO2 30 30 25 28   GLUCOSE 127* 95 72 94  BUN 22 25* 40* 29*  CREATININE 3.04* 3.29* 4.46* 3.67*  CALCIUM  8.6* 8.8* 8.9 8.9    CBG: Recent Labs  Lab 01/01/24 0420 01/01/24 0747 01/01/24 0900 01/01/24 1202 01/01/24 1610  GLUCAP 103* 90 79 82 236*    No results found for this or any previous visit (from the past 240 hours).   Radiology Studies: No results found.  Scheduled Meds:  sodium chloride    Intravenous Once   busPIRone   7.5 mg Oral BID   insulin  aspart  0-6 Units Subcutaneous Q4H   metoprolol  succinate  50 mg Oral Daily   pantoprazole   40 mg Oral Daily   sertraline   75 mg Oral Daily   sodium chloride  flush  3 mL Intravenous Q12H   Continuous Infusions:   ceFAZolin  (ANCEF ) IV Stopped (12/31/23 1648)     LOS: 0 days   Time spent: 50 mins  Dyer Klug Vicci, MD How to contact the [facility] Attending or Consulting provider 7A - 7P or covering provider during after hours 7P -7A, for this patient?  Check the care team in [facility] and look for a) attending/consulting [facility]  provider listed and b) the [facility] team listed Log into [email or web address] to find provider on call.  Locate the [facility] provider you are looking for under [facility] and page to a number that you can be directly reached. If you still have difficulty reaching the provider, please page the [facility] (Director on Call) for the Hospitalists listed on amion for assistance.  01/01/2024, 4:59 PM

## 2024-01-02 DIAGNOSIS — I96 Gangrene, not elsewhere classified: Secondary | ICD-10-CM | POA: Diagnosis not present

## 2024-01-02 DIAGNOSIS — I480 Paroxysmal atrial fibrillation: Secondary | ICD-10-CM | POA: Diagnosis not present

## 2024-01-02 DIAGNOSIS — D62 Acute posthemorrhagic anemia: Secondary | ICD-10-CM | POA: Diagnosis not present

## 2024-01-02 DIAGNOSIS — K625 Hemorrhage of anus and rectum: Secondary | ICD-10-CM | POA: Diagnosis not present

## 2024-01-02 LAB — RENAL FUNCTION PANEL
Albumin: 2.9 g/dL — ABNORMAL LOW (ref 3.5–5.0)
Anion gap: 20 — ABNORMAL HIGH (ref 5–15)
BUN: 44 mg/dL — ABNORMAL HIGH (ref 8–23)
CO2: 27 mmol/L (ref 22–32)
Calcium: 8.8 mg/dL — ABNORMAL LOW (ref 8.9–10.3)
Chloride: 103 mmol/L (ref 98–111)
Creatinine, Ser: 5.49 mg/dL — ABNORMAL HIGH (ref 0.44–1.00)
GFR, Estimated: 8 mL/min — ABNORMAL LOW (ref >60)
Glucose, Bld: 182 mg/dL — ABNORMAL HIGH (ref 70–99)
Phosphorus: 6.2 mg/dL — ABNORMAL HIGH (ref 2.5–4.6)
Potassium: 4.3 mmol/L (ref 3.5–5.1)
Sodium: 150 mmol/L — ABNORMAL HIGH (ref 135–145)

## 2024-01-02 LAB — CBC
HCT: 23.2 % — ABNORMAL LOW (ref 36.0–46.0)
Hemoglobin: 7.4 g/dL — ABNORMAL LOW (ref 12.0–15.0)
MCH: 27.9 pg (ref 26.0–34.0)
MCHC: 31.9 g/dL (ref 30.0–36.0)
MCV: 87.5 fL (ref 80.0–100.0)
Platelets: 331 K/uL (ref 150–400)
RBC: 2.65 MIL/uL — ABNORMAL LOW (ref 3.87–5.11)
RDW: 16.8 % — ABNORMAL HIGH (ref 11.5–15.5)
WBC: 8.5 K/uL (ref 4.0–10.5)
nRBC: 0.4 % — ABNORMAL HIGH (ref 0.0–0.2)

## 2024-01-02 LAB — GLUCOSE, CAPILLARY
Glucose-Capillary: 171 mg/dL — ABNORMAL HIGH (ref 70–99)
Glucose-Capillary: 93 mg/dL (ref 70–99)
Glucose-Capillary: 197 mg/dL — ABNORMAL HIGH (ref 70–99)

## 2024-01-02 MED ORDER — APIXABAN 2.5 MG PO TABS: 2.5000 mg | ORAL_TABLET | Freq: Two times a day (BID) | ORAL | Status: DC

## 2024-01-02 MED ORDER — SERTRALINE HCL 50 MG PO TABS: 75.0000 mg | ORAL_TABLET | Freq: Every day | ORAL | Status: DC

## 2024-01-02 MED ORDER — TORSEMIDE 100 MG PO TABS: 100.0000 mg | ORAL_TABLET | ORAL | Status: DC

## 2024-01-02 NOTE — Plan of Care (Signed)
   Problem: Education: Goal: Knowledge of General Education information will improve Description Including pain rating scale, medication(s)/side effects and non-pharmacologic comfort measures Outcome: Progressing   Problem: Health Behavior/Discharge Planning: Goal: Ability to manage health-related needs will improve Outcome: Progressing

## 2024-01-02 NOTE — Progress Notes (Signed)
 Patient and family member declined CPAP for tonight.

## 2024-01-02 NOTE — TOC Initial Note (Signed)
 Transition of Care [facility]) - Initial/Assessment Note    Patient Details  Name: Kathryn Beck MRN: [id number] Date of Birth: 09/15/1955  [facility] CM/SW Contact:    Lucie Lunger, LCSWA Phone Number: 01/02/2024, 12:19 PM  Clinical Narrative:                 CSW notes per chart review that pt is high risk for readmission. CSW spoke with pts spouse at bedside to complete assessment. Pt lives with her spouse. Pts family assists with ADLs. Pt has HH with [facility] currently. Pt has a wheelchair, hospital bed, and hoyer lift in the home. TOC to follow.   Expected Discharge Plan: Home w Home Health Services Barriers to Discharge: Continued Medical Work up   Patient Goals and CMS Choice Patient states their goals for this hospitalization and ongoing recovery are:: return home CMS Medicare.gov Compare Post Acute Care list provided to:: Patient Represenative (must comment) Choice offered to / list presented to : Spouse      Expected Discharge Plan and Services In-house Referral: Clinical Social Work Discharge Planning Services: CM Consult   Living arrangements for the past 2 months: Single Family Home Expected Discharge Date: 01/04/24                                    Prior Living Arrangements/Services Living arrangements for the past 2 months: Single Family Home Lives with:: Spouse Patient language and need for interpreter reviewed:: Yes Do you feel safe going back to the place where you live?: Yes      Need for Family Participation in Patient Care: Yes (Comment) Care giver support system in place?: Yes (comment)   Criminal Activity/Legal Involvement Pertinent to Current Situation/Hospitalization: No - Comment as needed  Activities of Daily Living   ADL Screening (condition at time of admission) Independently performs ADLs?: No Does the patient have a NEW difficulty with bathing/dressing/toileting/self-feeding that is expected to last >3 days?: No Does  the patient have a NEW difficulty with getting in/out of bed, walking, or climbing stairs that is expected to last >3 days?: No Does the patient have a NEW difficulty with communication that is expected to last >3 days?: No Is the patient deaf or have difficulty hearing?: No Does the patient have difficulty seeing, even when wearing glasses/contacts?: No Does the patient have difficulty concentrating, remembering, or making decisions?: No  Permission Sought/Granted                  Emotional Assessment Appearance:: Appears stated age Attitude/Demeanor/Rapport: Engaged Affect (typically observed): Accepting Orientation: : Oriented to Self, Oriented to Place, Oriented to  Time, Oriented to Situation Alcohol / Substance Use: Not Applicable Psych Involvement: No (comment)  Admission diagnosis:  Rectal bleeding [K62.5] Gastrointestinal hemorrhage, unspecified gastrointestinal hemorrhage type [K92.2] Anemia, unspecified type [D64.9] Patient Active Problem List   Diagnosis Date Noted   Rectal bleeding 12/29/2023   Acute blood loss anemia 12/29/2023   Lower GI bleed 12/16/2023   Gangrene (HCC) 12/06/2023   Acute GI bleeding 12/01/2023   Dry gangrene (HCC) - left hand 10/29/2023   PAF (paroxysmal atrial fibrillation) (HCC) 10/27/2023   Mass of breast 08/30/2023   Depression, major, single episode, moderate (HCC) 08/30/2023   GAD (generalized anxiety disorder) 08/30/2023   Decreased strength, endurance, and mobility 08/30/2023   Gangrene of finger of left hand (HCC) 08/30/2023   Uncontrolled type 2  diabetes mellitus with hyperglycemia (HCC) 08/30/2023   Hearing loss 04/27/2023   Otalgia of both ears 04/27/2023   Acute confusion 12/10/2022   Generalized weakness 11/18/2022   Paronychia of finger of right hand 11/13/2022   Dehiscence of amputation stump of left lower extremity (HCC) 10/07/2022   Status post below-knee amputation of left lower extremity (HCC) 10/07/2022   Subacute  osteomyelitis, left ankle and foot (HCC) 09/03/2022   Left foot infection 09/03/2022   Amputated left leg (HCC) 09/03/2022   Wound infection after surgery 09/03/2022   Equinus contracture of left ankle 08/15/2022   Peripheral vascular complication 07/21/2022   PVD (peripheral vascular disease) 07/18/2022   Toe ulcer, left, limited to breakdown of skin (HCC) 07/13/2022   S/P BKA (below knee amputation), right (HCC) 05/29/2022   Gangrene of right foot (HCC) 04/25/2022   PAD (peripheral artery disease) 04/25/2022   Other osteoporosis without current pathological fracture 11/10/2021   Hypocalcemia 11/10/2021   Low back pain with left-sided sciatica 10/31/2021   Left ear impacted cerumen 09/18/2021   Morbid obesity (HCC) 03/10/2021   Insomnia 10/28/2020   Memory loss or impairment 09/26/2020   Right leg weakness 02/28/2020   Knee pain, right 10/23/2019   Carpal tunnel syndrome 06/13/2019   Chronic pain syndrome 06/13/2019   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) 06/13/2019   ESRD on hemodialysis (HCC) 11/22/2018   Ischemic heart disease 09/25/2018   HTN (hypertension) 06/12/2018   Adrenal mass, left 11/09/2017   MGUS (monoclonal gammopathy of unknown significance) 11/05/2017   Chronic diastolic (congestive) heart failure (HCC) 09/20/2017   Bilateral leg weakness 09/09/2017   Adrenal adenoma 09/03/2017   Dysphagia 11/05/2016   Irritable bowel syndrome 02/04/2016   Cardiac murmur 01/17/2014   Back pain with left-sided radiculopathy 09/18/2013   Other seasonal allergic rhinitis 08/22/2012   OSA (obstructive sleep apnea) 06/02/2011   Neuropathy 04/16/2010   LUPUS ERYTHEMATOSUS, DISCOID 06/05/2008   Hyperlipidemia 06/07/2007   Obesity 06/07/2007   Type 2 diabetes mellitus with hyperglycemia (HCC) 06/07/2007   Diabetes mellitus (HCC) 06/07/2007   PCP:  Antonetta Rollene BRAVO, MD Pharmacy:   [facility] - [place], [place] - [address] [place] [address] Phone: [phone] Fax: [phone]     Social Drivers of Health (SDOH) Social History: SDOH Screenings   Food Insecurity: No Food Insecurity (12/30/2023)  Housing: Low Risk  (12/30/2023)  Transportation Needs: No Transportation Needs (12/30/2023)  Utilities: Not At Risk (12/30/2023)  Alcohol Screen: Low Risk  (05/03/2023)  Depression (PHQ2-9): Low Risk  (12/21/2023)  Recent Concern: Depression (PHQ2-9) - High Risk (11/24/2023)  Financial Resource Strain: Low Risk  (11/24/2023)  Physical Activity: Inactive (11/24/2023)  Social Connections: Moderately Integrated (12/30/2023)  Recent Concern: Social Connections - Moderately Isolated (11/25/2023)  Stress: Stress Concern Present (11/24/2023)  Tobacco Use: Low Risk  (01/01/2024)  Health Literacy: Adequate Health Literacy (05/03/2023)  Recent Concern: Health Literacy - Inadequate Health Literacy (02/08/2023)   SDOH Interventions:     Readmission Risk Interventions    01/02/2024   12:17 PM 12/16/2023    3:19 PM 12/02/2023   12:48 PM  Readmission Risk Prevention Plan  Transportation Screening Complete Complete Complete  PCP or Specialist Appt within 3-5 Days   Not Complete  HRI or Home Care Consult   Complete  Social Work Consult for Recovery Care Planning/Counseling   Complete  Palliative Care Screening   Not Applicable  Medication Review Oceanographer) Complete Complete Complete  HRI or Home Care Consult Complete Complete   SW Recovery Care/Counseling Consult Complete Complete   Palliative Care Screening Not Applicable Not Applicable   Skilled Nursing Facility Not Applicable Not Applicable

## 2024-01-02 NOTE — Discharge Summary (Signed)
 Physician Discharge Summary  Kathryn Beck FMW:[id number] DOB: September 23, 1955 DOA: 12/29/2023  PCP: Antonetta Rollene BRAVO, MD  Admit date: 12/29/2023 Discharge date: 01/02/2024  Admitted From:  HOME  Disposition: HOME WITH HH SERVICES  Recommendations for Outpatient Follow-up:  Follow up with [facility] as scheduled  Resume regular HD schedule Please check H/H prior to HD treatment to follow up  Pt was advised to hold apixaban  for 2 days, then resume   Home Health: PT  Discharge Condition: STABLE   CODE STATUS: FULL DIET:  renal/carb    Brief Hospitalization Summary: Please see all hospital notes, images, labs for full details of the hospitalization. Admission provider HPI:  68 y.o. female with medical history significant for hypertension, hyperlipidemia, type 2 diabetes mellitus, atrial fibrillation on Eliquis , PAD with bilateral BKA, memory loss, ESRD, finger gangrene status post I&D currently on cefazolin  with dialysis, and recent admissions for GI bleeding who presents to the emergency department with rectal bleeding.   Patient is accompanied by her husband and daughter who assisted with the history.  She was noted to have dark red blood in her stool this morning.  Her Eliquis  was held this morning in light of this.  She had dialysis today and following that, was noted to have dark red blood from her rectum without any stool.  The patient denies any pain, nausea, or vomiting.   She had been admitted to the hospital in September with GI bleeding that was suspected to be diverticular.  She had polypectomy performed and was readmitted with post polypectomy bleeding.  Her Eliquis  had been on hold but was resumed on 12/22/2023 at 2.5 mg twice daily (she was previously on 5 mg twice daily).   ED Course: Upon arrival to the ED, patient is found to be afebrile and saturating well on room air with normal HR and stable BP.  Labs are most notable for normal BUN, creatinine 3.04, normal WBC, and  hemoglobin 7.0.  Hospital Course by listed problems addressed  Rectal bleeding -- resolved  GI consulted. Monitored HgB. Holding Eliquis  temporarily, can resume on 10/14 per GI   Colonoscopy performed 01/01/24 with Dr. Cindie:    - Non-bleeding internal hemorrhoids.                           - Diverticulosis in the entire examined colon.                           - Blood in the descending colon and in the transverse colon.               - 4 ulcers in the descending colon and in the transverse colon. Clip manufacturer: [employer]. Clips (MR safe) were placed.                           - Foreign body in the ascending colon.                           - No specimens collected. Colonoscopy per Dr. Cindie -- hold apixaban  x 2 more days     Acute blood loss anemia monitor HgB. Transfuse as needed to keep HgB >= 7.0 - Hg 7.4 today, discussed with GI ok to DC home today, outpatient follow up with [facility] and repeat H/H with outpatient HD treatment  Dry gangrene - left hand wound care consult. Follows with hand surgery. stable. Continue wound care.   PAF (paroxysmal atrial fibrillation)  holding Eliquis  due to GI bleeding. Continue Toprol -XL   dtr states that GI bleeding did not start until after pt was placed on Eliquis . Discussed that pt may not be able to tolerate systemic anticoagulation and she may only be on ASA due to recurrent GI bleeding. -- discussed with Dr. Cindie, ok to restart apixaban  in 2 days      Uncontrolled type 2 diabetes mellitus with hyperglycemia   on SSI.   stable CBG     PAD (peripheral artery disease) continue ntg patch to left wrist.   continue NTG patch to left wrist.     ESRD on hemodialysis  nephrology consulted for routine HD.   continue routine HD. Got hypotensive today. Only 500 ml UF removed.     HTN (hypertension)  continue Toprol -XL    stable.     OSA (obstructive sleep apnea)  stable   stable.   Discharge  Diagnoses:  Principal Problem:   Rectal bleeding Active Problems:   OSA (obstructive sleep apnea)   HTN (hypertension)   ESRD on hemodialysis (HCC)   PAD (peripheral artery disease)   Uncontrolled type 2 diabetes mellitus with hyperglycemia (HCC)   PAF (paroxysmal atrial fibrillation) (HCC)   Dry gangrene (HCC) - left hand   Acute blood loss anemia   Discharge Instructions:  Allergies as of 01/02/2024       Reactions   Ace Inhibitors Anaphylaxis, Swelling   Penicillins Itching, Swelling   Has tolerated cefazolin  on multiple occasions    Statins Other (See Comments)   Elevated LFT's   Albuterol  Swelling        Medication List     TAKE these medications    acetaminophen  500 MG tablet Commonly known as: TYLENOL  Take 1,000 mg by mouth every 6 (six) hours as needed for mild pain (pain score 1-3).   amLODipine  10 MG tablet Commonly known as: NORVASC  Take 0.5 tablets (5 mg total) by mouth daily.   apixaban  2.5 MG Tabs tablet Commonly known as: Eliquis  Take 1 tablet (2.5 mg total) by mouth 2 (two) times daily. Start taking on: January 04, 2024 What changed: These instructions start on January 04, 2024. If you are unsure what to do until then, ask your doctor or other care provider.   busPIRone  7.5 MG tablet Commonly known as: BUSPAR  Take 1 tablet (7.5 mg total) by mouth 2 (two) times daily.   calcitRIOL  0.5 MCG capsule Commonly known as: ROCALTROL  Take 2 capsules (1 mcg total) by mouth every Monday, Wednesday, and Friday with hemodialysis.   CEFAZOLIN  SODIUM IV Inject into the vein every Monday, Wednesday, and Friday with hemodialysis.   cinacalcet  30 MG tablet Commonly known as: SENSIPAR  Take 3 tablets (90 mg total) by mouth every Monday, Wednesday, and Friday.   ezetimibe  10 MG tablet Commonly known as: ZETIA  Take 1 tablet (10 mg total) by mouth daily.   insulin  aspart 100 UNIT/ML injection Commonly known as: novoLOG  Inject 0-6 Units into the skin 3  (three) times daily with meals. CBG 70 - 120: 0 units  CBG 121 - 150: 0 units  CBG 151 - 200: 1 unit  CBG 201-250: 2 units  CBG 251-300: 3 units  CBG 301-350: 4 units  CBG 351-400: 5 units  CBG > 400: Give 10 units and call MD   metoprolol  succinate 50 MG 24 hr tablet Commonly  known as: TOPROL -XL TAKE ONE TABLET BY MOUTH ONCE DAILY WITH FOOD (TAKE AFTER DIALYSIS) What changed:  how much to take how to take this when to take this additional instructions   nitroGLYCERIN  0.2 mg/hr patch Commonly known as: NITRODUR - Dosed in mg/24 hr Place 1 patch (0.2 mg total) onto the skin daily.   ondansetron  4 MG tablet Commonly known as: ZOFRAN  Take 4 mg by mouth every 8 (eight) hours as needed for nausea or vomiting.   pantoprazole  40 MG tablet Commonly known as: PROTONIX  Take 1 tablet (40 mg total) by mouth daily.   Rena-Vite Rx 1 MG Tabs Take 1 tablet by mouth daily.   sertraline  50 MG tablet Commonly known as: ZOLOFT  Take 1.5 tablets (75 mg total) by mouth daily. Take one and a half tablets once daily   torsemide  100 MG tablet Commonly known as: DEMADEX  Take 1 tablet (100 mg total) by mouth every Tuesday, Thursday, Saturday, and Sunday. Non-dialysis days   Toujeo  Max SoloStar 300 UNIT/ML Solostar Pen Generic drug: insulin  glargine (2 Unit Dial ) Inject 30 Units into the skin daily. May need to slowly increase the dose depending upon your blood sugar, follow-up with PCP   [name]  500 MG chewable tablet Generic drug: sucroferric oxyhydroxide Chew 1-2 tablets (500-1,000 mg total) by mouth See admin instructions. Take 1000mg  (2 tablets) by mouth with meals and 500mg  (1 tablet) with snacks        Follow-up Information     [facility]. Schedule an appointment as soon as possible for a visit in 2 week(s).   Why: Hospital Follow Up Contact information: [address] [place]  [phone]        Hemodialysis Follow up.    Why: RESUME REGULAR DIALYSIS SCHEDULE               Allergies  Allergen Reactions   Ace Inhibitors Anaphylaxis and Swelling   Penicillins Itching and Swelling    Has tolerated cefazolin  on multiple occasions    Statins Other (See Comments)    Elevated LFT's   Albuterol  Swelling   Allergies as of 01/02/2024       Reactions   Ace Inhibitors Anaphylaxis, Swelling   Penicillins Itching, Swelling   Has tolerated cefazolin  on multiple occasions    Statins Other (See Comments)   Elevated LFT's   Albuterol  Swelling        Medication List     TAKE these medications    acetaminophen  500 MG tablet Commonly known as: TYLENOL  Take 1,000 mg by mouth every 6 (six) hours as needed for mild pain (pain score 1-3).   amLODipine  10 MG tablet Commonly known as: NORVASC  Take 0.5 tablets (5 mg total) by mouth daily.   apixaban  2.5 MG Tabs tablet Commonly known as: Eliquis  Take 1 tablet (2.5 mg total) by mouth 2 (two) times daily. Start taking on: January 04, 2024 What changed: These instructions start on January 04, 2024. If you are unsure what to do until then, ask your doctor or other care provider.   busPIRone  7.5 MG tablet Commonly known as: BUSPAR  Take 1 tablet (7.5 mg total) by mouth 2 (two) times daily.   calcitRIOL  0.5 MCG capsule Commonly known as: ROCALTROL  Take 2 capsules (1 mcg total) by mouth every Monday, Wednesday, and Friday with hemodialysis.   CEFAZOLIN  SODIUM IV Inject into the vein every Monday, Wednesday, and Friday with hemodialysis.   cinacalcet  30 MG tablet Commonly known as: SENSIPAR  Take 3 tablets (  90 mg total) by mouth every Monday, Wednesday, and Friday.   ezetimibe  10 MG tablet Commonly known as: ZETIA  Take 1 tablet (10 mg total) by mouth daily.   insulin  aspart 100 UNIT/ML injection Commonly known as: novoLOG  Inject 0-6 Units into the skin 3 (three) times daily with meals. CBG 70 - 120: 0 units  CBG 121 - 150: 0 units  CBG 151 - 200:  1 unit  CBG 201-250: 2 units  CBG 251-300: 3 units  CBG 301-350: 4 units  CBG 351-400: 5 units  CBG > 400: Give 10 units and call MD   metoprolol  succinate 50 MG 24 hr tablet Commonly known as: TOPROL -XL TAKE ONE TABLET BY MOUTH ONCE DAILY WITH FOOD (TAKE AFTER DIALYSIS) What changed:  how much to take how to take this when to take this additional instructions   nitroGLYCERIN  0.2 mg/hr patch Commonly known as: NITRODUR - Dosed in mg/24 hr Place 1 patch (0.2 mg total) onto the skin daily.   ondansetron  4 MG tablet Commonly known as: ZOFRAN  Take 4 mg by mouth every 8 (eight) hours as needed for nausea or vomiting.   pantoprazole  40 MG tablet Commonly known as: PROTONIX  Take 1 tablet (40 mg total) by mouth daily.   Rena-Vite Rx 1 MG Tabs Take 1 tablet by mouth daily.   sertraline  50 MG tablet Commonly known as: ZOLOFT  Take 1.5 tablets (75 mg total) by mouth daily. Take one and a half tablets once daily   torsemide  100 MG tablet Commonly known as: DEMADEX  Take 1 tablet (100 mg total) by mouth every Tuesday, Thursday, Saturday, and Sunday. Non-dialysis days   Toujeo  Max SoloStar 300 UNIT/ML Solostar Pen Generic drug: insulin  glargine (2 Unit Dial ) Inject 30 Units into the skin daily. May need to slowly increase the dose depending upon your blood sugar, follow-up with PCP   [name]  500 MG chewable tablet Generic drug: sucroferric oxyhydroxide Chew 1-2 tablets (500-1,000 mg total) by mouth See admin instructions. Take 1000mg  (2 tablets) by mouth with meals and 500mg  (1 tablet) with snacks        Procedures/Studies: CT ANGIO GI BLEED Result Date: 12/16/2023 CLINICAL DATA:  Persistent GI bleed status post colonoscopy with polypectomy EXAM: CTA ABDOMEN AND PELVIS WITHOUT AND WITH CONTRAST TECHNIQUE: Multidetector CT imaging of the abdomen and pelvis was performed using the standard protocol during bolus administration of intravenous contrast. Multiplanar reconstructed  images and MIPs were obtained and reviewed to evaluate the vascular anatomy. RADIATION DOSE REDUCTION: This exam was performed according to the departmental dose-optimization program which includes automated exposure control, adjustment of the mA and/or kV according to patient size and/or use of iterative reconstruction technique. CONTRAST:  OMNIPAQUE  IOHEXOL  350 MG/ML SOLN COMPARISON:  CT abdomen and pelvis dated 03/24/2017 FINDINGS: VASCULAR Aorta: Normal caliber aorta and branch vessels without aneurysm, dissection, vasculitis or significant stenosis. No active extravasation. Aortic atherosclerosis. Conventional celiac branching pattern. Single bilateral renal arteries demonstrating mild luminal narrowing of the origins due to atherosclerotic plaque. Mild to moderate luminal narrowing of the internal iliac arteries bilaterally due to atherosclerotic plaque. Proximal Outflow: Segmental mild luminal narrowing of bilateral superficial femoral arteries due to atherosclerotic plaque. No aneurysm. Veins: No obvious venous abnormality within the limitations of this arterial phase study. Review of the MIP images confirms the above findings. NON-VASCULAR Lower chest: Unchanged left lower lobe scarring and pleural thickening. No pneumothorax. Mild multichamber cardiomegaly. Coronary artery calcifications. Hepatobiliary: Subcentimeter hypodensity in the caudate lobe (15:22), too small to characterize.  No intra or extrahepatic biliary ductal dilation. Normal gallbladder. Pancreas: No focal lesions or main ductal dilation. Spleen: Normal in size without focal abnormality. Adrenals/Urinary Tract: Unchanged bilateral adrenal nodules measuring 1.7 cm, 29 HU on the right and 1.9 cm, 20 HU on the left, likely adenomas. No specific follow-up imaging recommended. No hydronephrosis or renal calculi. Bilateral renal cysts, many of which are too small to characterize. A 1.3 cm exophytic enhancing lesion arising from the  posteromedial aspect of the lower pole right kidney (15:48) is new from 2019. No focal bladder wall thickening. Stomach/Bowel: Normal appearance of the stomach. No evidence of bowel wall thickening, distention, or inflammatory changes. Colonic diverticulosis without acute diverticulitis. Appendix is not discretely seen. Lymphatic: No enlarged abdominal or pelvic lymph nodes. Reproductive: No adnexal masses. Other: No free fluid, fluid collection, or free air. Sequela of fat necrosis in the midline anterior peritoneum (4:51). Musculoskeletal: No acute or abnormal lytic or blastic osseous lesions. Postsurgical changes of the anterior abdominal wall. Metallic clips adjacent to the right inguinal region and in the lower quadrant. Multilevel degenerative changes of the partially imaged thoracic and lumbar spine. IMPRESSION: 1. No active extravasation. 2. A 1.3 cm exophytic enhancing lesion arising from the posteromedial aspect of the lower pole right kidney is new from 2019 and suspicious for renal cell carcinoma. Recommend consultation to urology. 3. Colonic diverticulosis without acute diverticulitis. 4. Aortic Atherosclerosis (ICD10-I70.0). Coronary artery calcifications. Assessment for potential risk factor modification, dietary therapy or pharmacologic therapy may be warranted, if clinically indicated. Electronically Signed   By: Limin  Xu M.D.   On: 12/16/2023 14:54   DG CHEST PORT 1 VIEW Result Date: 12/06/2023 CLINICAL DATA:  Leukocytosis EXAM: PORTABLE CHEST 1 VIEW COMPARISON:  Radiograph 11/10/2023 FINDINGS: Stable large cardiac silhouette. There is increased patchy airspace disease in the LEFT upper lobe and LEFT lower lobe. RIGHT lung clear. No pneumothorax. No acute osseous abnormality. IMPRESSION: New airspace patchy density in the LEFT lung. Findings concerning for multifocal pneumonia. Electronically Signed   By: Jackquline Boxer M.D.   On: 12/06/2023 13:52     Subjective:   Discharge  Exam: Vitals:   01/01/24 2130 01/02/24 0406  BP: (!) 139/55 (!) 159/59  Pulse: 77 71  Resp: 18 19  Temp: 98.3 F (36.8 C) 97.9 F (36.6 C)  SpO2: 95% 97%   Vitals:   01/01/24 1204 01/01/24 1346 01/01/24 2130 01/02/24 0406  BP: (!) 169/63 (!) 125/51 (!) 139/55 (!) 159/59  Pulse: 69 87 77 71  Resp:  18 18 19   Temp:  98.3 F (36.8 C) 98.3 F (36.8 C) 97.9 F (36.6 C)  TempSrc:   Oral Oral  SpO2: 98% 98% 95% 97%  Weight:      Height:        General: Pt is alert, awake, not in acute distress Cardiovascular: RRR, S1/S2 +, no rubs, no gallops Respiratory: CTA bilaterally, no wheezing, no rhonchi Abdominal: Soft, NT, ND, bowel sounds + Extremities: no edema, no cyanosis   The results of significant diagnostics from this hospitalization (including imaging, microbiology, ancillary and laboratory) are listed below for reference.     Microbiology: No results found for this or any previous visit (from the past 240 hours).   Labs: BNP (last 3 results) No results for input(s): BNP in the last 8760 hours. Basic Metabolic Panel: Recent Labs  Lab 12/29/23 1842 12/30/23 0223 12/31/23 0250 01/01/24 0412 01/02/24 0430  NA 144 142 141 141 150*  K 4.0 3.9 5.4*  4.6 4.3  CL 102 102 101 101 103  CO2 30 30 25 28 27   GLUCOSE 127* 95 72 94 182*  BUN 22 25* 40* 29* 44*  CREATININE 3.04* 3.29* 4.46* 3.67* 5.49*  CALCIUM  8.6* 8.8* 8.9 8.9 8.8*  PHOS  --   --   --   --  6.2*   Liver Function Tests: Recent Labs  Lab 01/02/24 0430  ALBUMIN  2.9*   No results for input(s): LIPASE, AMYLASE in the last 168 hours. No results for input(s): AMMONIA in the last 168 hours. CBC: Recent Labs  Lab 12/29/23 1842 12/30/23 0223 12/31/23 0250 12/31/23 1328 12/31/23 1510 01/01/24 0412 01/02/24 0549  WBC 8.1  --   --   --   --  7.0 8.5  NEUTROABS  --   --   --   --   --  5.7  --   HGB 7.0*   < > 7.1* 6.3* 6.1* 8.5* 7.4*  HCT 23.3*   < > 24.2* 20.5* 20.0* 26.3* 23.2*  MCV 87.9  --    --   --   --  86.8 87.5  PLT 308  --   --   --   --  307 331   < > = values in this interval not displayed.   Cardiac Enzymes: No results for input(s): CKTOTAL, CKMB, CKMBINDEX, TROPONINI in the last 168 hours. BNP: Invalid input(s): POCBNP CBG: Recent Labs  Lab 01/01/24 1957 01/01/24 2341 01/02/24 0404 01/02/24 0715 01/02/24 1126  GLUCAP 262* 294* 171* 93 197*   D-Dimer No results for input(s): DDIMER in the last 72 hours. Hgb A1c No results for input(s): HGBA1C in the last 72 hours. Lipid Profile No results for input(s): CHOL, HDL, LDLCALC, TRIG, CHOLHDL, LDLDIRECT in the last 72 hours. Thyroid  function studies No results for input(s): TSH, T4TOTAL, T3FREE, THYROIDAB in the last 72 hours.  Invalid input(s): FREET3 Anemia work up No results for input(s): VITAMINB12, FOLATE, FERRITIN, TIBC, IRON, RETICCTPCT in the last 72 hours. Urinalysis    Component Value Date/Time   COLORURINE AMBER (A) 11/10/2023 1101   APPEARANCEUR TURBID (A) 11/10/2023 1101   LABSPEC 1.017 11/10/2023 1101   PHURINE 5.0 11/10/2023 1101   GLUCOSEU NEGATIVE 11/10/2023 1101   HGBUR LARGE (A) 11/10/2023 1101   BILIRUBINUR NEGATIVE 11/10/2023 1101   BILIRUBINUR neg 02/15/2017 0913   KETONESUR NEGATIVE 11/10/2023 1101   PROTEINUR 100 (A) 11/10/2023 1101   UROBILINOGEN 0.2 02/15/2017 0913   NITRITE NEGATIVE 11/10/2023 1101   LEUKOCYTESUR MODERATE (A) 11/10/2023 1101   Sepsis Labs Recent Labs  Lab 12/29/23 1842 01/01/24 0412 01/02/24 0549  WBC 8.1 7.0 8.5   Microbiology No results found for this or any previous visit (from the past 240 hours).  Time coordinating discharge:  34 mins  SIGNED:  Afton Louder, MD  [facility] 01/02/2024, 1:04 PM How to contact the [facility] Attending or Consulting provider 7A - 7P or covering provider during after hours 7P -7A, for this patient?  Check the care team in [facility] and look for a)  attending/consulting [facility] provider listed and b) the [facility] team listed Log into [email or web address] and use [place]'s universal password to access. If you do not have the password, please contact the hospital operator. Locate the [facility] provider you are looking for under [facility] and page to a number that you can be directly reached. If you still have difficulty reaching the provider, please page the [facility] (Director on Call) for the Hospitalists listed  on amion for assistance.

## 2024-01-02 NOTE — Discharge Instructions (Addendum)
 HOLD APIXABAN  FOR NOW AND RESTART ON 01/04/24   IMPORTANT INFORMATION: PAY CLOSE ATTENTION   PHYSICIAN DISCHARGE INSTRUCTIONS  Follow with Primary care provider  Antonetta Rollene BRAVO, MD  and other consultants as instructed by your Hospitalist Physician  SEEK MEDICAL CARE OR RETURN TO EMERGENCY ROOM IF SYMPTOMS COME BACK, WORSEN OR NEW PROBLEM DEVELOPS   Please note: You were cared for by a hospitalist during your hospital stay. Every effort will be made to forward records to your primary care provider.  You can request that your primary care provider send for your hospital records if they have not received them.  Once you are discharged, your primary care physician will handle any further medical issues. Please note that NO REFILLS for any discharge medications will be authorized once you are discharged, as it is imperative that you return to your primary care physician (or establish a relationship with a primary care physician if you do not have one) for your post hospital discharge needs so that they can reassess your need for medications and monitor your lab values.  Please get a complete blood count and chemistry panel checked by your Primary MD at your next visit, and again as instructed by your Primary MD.  Get Medicines reviewed and adjusted: Please take all your medications with you for your next visit with your Primary MD  Laboratory/radiological data: Please request your Primary MD to go over all hospital tests and procedure/radiological results at the follow up, please ask your primary care provider to get all Hospital records sent to his/her office.  In some cases, they will be blood work, cultures and biopsy results pending at the time of your discharge. Please request that your primary care provider follow up on these results.  If you are diabetic, please bring your blood sugar readings with you to your follow up appointment with primary care.    Please call and make your  follow up appointments as soon as possible.    Also Note the following: If you experience worsening of your admission symptoms, develop shortness of breath, life threatening emergency, suicidal or homicidal thoughts you must seek medical attention immediately by calling 911 or calling your MD immediately  if symptoms less severe.  You must read complete instructions/literature along with all the possible adverse reactions/side effects for all the Medicines you take and that have been prescribed to you. Take any new Medicines after you have completely understood and accpet all the possible adverse reactions/side effects.   Do not drive when taking Pain medications or sleeping medications (Benzodiazepines)  Do not take more than prescribed Pain, Sleep and Anxiety Medications. It is not advisable to combine anxiety,sleep and pain medications without talking with your primary care practitioner  Special Instructions: If you have smoked or chewed Tobacco  in the last 2 yrs please stop smoking, stop any regular Alcohol  and or any Recreational drug use.  Wear Seat belts while driving.  Do not drive if taking any narcotic, mind altering or controlled substances or recreational drugs or alcohol.

## 2024-01-02 NOTE — Plan of Care (Signed)

## 2024-01-03 ENCOUNTER — Ambulatory Visit (HOSPITAL_COMMUNITY): Admitting: Occupational Therapy

## 2024-01-03 ENCOUNTER — Telehealth: Payer: Self-pay | Admitting: Gastroenterology

## 2024-01-03 ENCOUNTER — Telehealth: Payer: Self-pay

## 2024-01-03 DIAGNOSIS — N186 End stage renal disease: Secondary | ICD-10-CM | POA: Diagnosis not present

## 2024-01-03 DIAGNOSIS — Z992 Dependence on renal dialysis: Secondary | ICD-10-CM | POA: Diagnosis not present

## 2024-01-03 DIAGNOSIS — E1122 Type 2 diabetes mellitus with diabetic chronic kidney disease: Secondary | ICD-10-CM | POA: Diagnosis not present

## 2024-01-03 NOTE — Telephone Encounter (Signed)
 Ladonna: needs hospital follow-up in about 4 weeks with Sonny or Dr. Cindie who saw her last inpatient. Please reach out to APP if unable to find a slot. I also saw her during hospitalization prior.

## 2024-01-03 NOTE — Telephone Encounter (Signed)
 Yes that's good, thanks!

## 2024-01-03 NOTE — Progress Notes (Signed)
 D/c over weekend noted- late note entry 01/03/24 1041am Contacted out-pt h[facility] clinic, [facility] to inform of pt d/c and that pt should have arrived back today. D/c summ and last note faxed at this time.no further support needed.   Bridgette Wolden Dialysis Navigator [phone]

## 2024-01-03 NOTE — Transitions of Care (Post Inpatient/ED Visit) (Signed)
   01/03/2024  Name: Kathryn Beck MRN: [id number] DOB: April 07, 1955  Today's TOC FU Call Status: Today's TOC FU Call Status:: Unsuccessful Call (1st Attempt) Unsuccessful Call (1st Attempt) Date: 01/03/24  Attempted to reach the patient regarding the most recent Inpatient/ED visit.  Follow Up Plan: Additional outreach attempts will be made to reach the patient to complete the Transitions of Care (Post Inpatient/ED visit) call.   Alan Ee, RN, BSN, CEN [employer]- Transition of Care Team.  [facility] [phone]

## 2024-01-04 DIAGNOSIS — S88912A Complete traumatic amputation of left lower leg, level unspecified, initial encounter: Secondary | ICD-10-CM | POA: Diagnosis not present

## 2024-01-04 DIAGNOSIS — I96 Gangrene, not elsewhere classified: Secondary | ICD-10-CM | POA: Diagnosis not present

## 2024-01-04 DIAGNOSIS — Z89511 Acquired absence of right leg below knee: Secondary | ICD-10-CM | POA: Diagnosis not present

## 2024-01-05 ENCOUNTER — Encounter (HOSPITAL_COMMUNITY): Payer: Self-pay | Admitting: Internal Medicine

## 2024-01-05 ENCOUNTER — Telehealth: Payer: Self-pay

## 2024-01-05 ENCOUNTER — Other Ambulatory Visit: Payer: Self-pay

## 2024-01-05 NOTE — Transitions of Care (Post Inpatient/ED Visit) (Signed)
 01/05/2024  Name: Kathryn Beck MRN: [id number] DOB: November 01, 1955  Today's TOC FU Call Status: Today's TOC FU Call Status:: Successful TOC FU Call Completed Patient's Name and Date of Birth confirmed.  Transition Care Management Follow-up Telephone Call Date of Discharge: 01/02/24 [facility] Type of Discharge: Inpatient Admission Primary Inpatient Discharge Diagnosis:: GI Bleed How have you been since you were released from the hospital?: Better Any questions or concerns?: No  Items Reviewed: Did you receive and understand the discharge instructions provided?: Yes Medications obtained,verified, and reconciled?: Yes (Medications Reviewed) (Reviewed with daughter, Augusto) Any new allergies since your discharge?: No Dietary orders reviewed?: NA Type of Diet Ordered:: Renal Carb Mod with fluid restrictions (Dialysis pt) (daughter states no changes) Do you have support at home?: Yes People in Home [RPT]: child(ren), adult, spouse Name of Support/Comfort Primary Source: [name] and has a caregiver  Medications Reviewed Today: Reviewed with [name], daughter, patient had dialysis today Medications Reviewed Today     Reviewed by Eilleen Richerd GRADE, RN (Registered Nurse) on 01/05/24 at 1500  Med List Status: <None>   Medication Order Taking? Sig Documenting Provider Last Dose Status Informant  0.9 %  sodium chloride  infusion [id number]   Lockamy, Randi L, NP-C  Active   acetaminophen  (TYLENOL ) 500 MG tablet [id number] Yes Take 1,000 mg by mouth every 6 (six) hours as needed for mild pain (pain score 1-3). [provider]  Active Child, Pharmacy Records  amLODipine  (NORVASC ) 10 MG tablet [id number] Yes Take 0.5 tablets (5 mg total) by mouth daily. Pearlean Manus, MD  Active Child, Pharmacy Records           Med Note (WARD, CHUCK MATSU   Wed Nov 10, 2023  7:52 PM)    apixaban  (ELIQUIS ) 2.5 MG TABS tablet [id number]  Take 1 tablet (2.5 mg total) by mouth 2  (two) times daily. Vicci Afton LITTIE, MD  Active            Med Note LESLY, RICHERD GRADE [place] Jan 05, 2024  2:59 PM) Daughter states she was going to hold until tomorrow patient's dialysis was today.  B Complex-C-Folic Acid  (RENA-VITE RX) 1 MG TABS [id number] Yes Take 1 tablet by mouth daily. [provider]  Active Child, Pharmacy Records           Med Note WORLEY, ALASKA S   Thu Nov 19, 2022 11:34 AM)    busPIRone  (BUSPAR ) 7.5 MG tablet [id number] Yes Take 1 tablet (7.5 mg total) by mouth 2 (two) times daily. Antonetta Rollene BRAVO, MD  Active Child, Pharmacy Records  calcitRIOL  (ROCALTROL ) 0.5 MCG capsule [id number] Yes Take 2 capsules (1 mcg total) by mouth every Monday, Wednesday, and Friday with hemodialysis. Caleen Burgess BROCKS, MD  Active Child, Pharmacy Records           Med Note DRENA, CHUCK MATSU [place] Dec 01, 2023  5:33 PM)    CEFAZOLIN  SODIUM IV [id number] Yes Inject into the vein every Monday, Wednesday, and Friday with hemodialysis. [provider]  Active Child, Pharmacy Records  cinacalcet  (SENSIPAR ) 30 MG tablet [id number] Yes Take 3 tablets (90 mg total) by mouth every Monday, Wednesday, and Friday. Vicci Afton LITTIE, MD  Active Child, Pharmacy Records  ezetimibe  (ZETIA ) 10 MG tablet [id number] Yes Take 1 tablet (10 mg total) by mouth daily. Antonetta Rollene BRAVO, MD  Active Child, Pharmacy Records    Discontinued 05/28/11 1302 (Patient has not taken in last 30 days)  Discontinued 05/28/11 1302 (Change in therapy)   insulin  aspart (NOVOLOG ) 100 UNIT/ML injection [id number] Yes Inject 0-6 Units into the skin 3 (three) times daily with meals. CBG 70 - 120: 0 units  CBG 121 - 150: 0 units  CBG 151 - 200: 1 unit  CBG 201-250: 2 units  CBG 251-300: 3 units  CBG 301-350: 4 units  CBG 351-400: 5 units  CBG > 400: Give 10 units and call MD Willette Adriana LABOR, MD  Active Child, Pharmacy Records  insulin  glargine, 2 Unit Dial , (TOUJEO  MAX SOLOSTAR) 300 UNIT/ML Solostar Pen  [id number] Yes Inject 30 Units into the skin daily. May need to slowly increase the dose depending upon your blood sugar, follow-up with PCP Trixie File, MD  Active Child, Pharmacy Records           Med Note WORLEY, ALASKA S   Wed Oct 27, 2023  2:44 PM)    metoprolol  succinate (TOPROL -XL) 50 MG 24 hr tablet [id number]  TAKE ONE TABLET BY MOUTH ONCE DAILY WITH FOOD (TAKE AFTER DIALYSIS)  Patient taking differently: Take 50 mg by mouth See admin instructions. On Non-dialysis days -Tues,Thurs,Sat and Sun.   Antonetta Rollene BRAVO, MD  Active Child, Pharmacy Records  nitroGLYCERIN  (NITRODUR - DOSED IN MG/24 HR) 0.2 mg/hr patch [id number] Yes Place 1 patch (0.2 mg total) onto the skin daily. Arlinda Buster, MD  Active Child, Pharmacy Records  ondansetron  (ZOFRAN ) 4 MG tablet [id number]  Take 4 mg by mouth every 8 (eight) hours as needed for nausea or vomiting. [provider]  Active Child, Pharmacy Records  pantoprazole  (PROTONIX ) 40 MG tablet [id number] Yes Take 1 tablet (40 mg total) by mouth daily. Vicci Afton CROME, MD  Active Child, Pharmacy Records  sertraline  (ZOLOFT ) 50 MG tablet [id number] Yes Take 1.5 tablets (75 mg total) by mouth daily. Take one and a half tablets once daily Vicci Afton L, MD  Active   torsemide  (DEMADEX ) 100 MG tablet [id number] Yes Take 1 tablet (100 mg total) by mouth every Tuesday, Thursday, Saturday, and Sunday. Non-dialysis days Vicci Afton CROME, MD  Active   [name]  500 MG chewable tablet [id number] Yes Chew 1-2 tablets (500-1,000 mg total) by mouth See admin instructions. Take 1000mg  (2 tablets) by mouth with meals and 500mg  (1 tablet) with snacks Antonetta Rollene BRAVO, MD  Active Child, Pharmacy Records           Med Note Calhoun, DAWN S   Thu Dec 16, 2023  2:06 PM) Pt gets meds from Dr.  Suellyn List Note Sydell Heron NOVAK, RN 11/25/23 1342): Patient has dialysis on M-W-F, pt's daughter handles pt's medications 11/25/23 Medications list review with  daughter, [name] on call today. Noted changes from PCP HFU visit.             Home Care and Equipment/Supplies: Name of Home Health Agency:: [facility] Has Agency set up a time to come to your home?: Yes First Home Health Visit Date: 01/05/24 Any new equipment or medical supplies ordered?: No  Functional Questionnaire: Do you need assistance with bathing/showering or dressing?: Yes Do you need assistance with meal preparation?: Yes Do you need assistance with eating?: Yes Do you have difficulty maintaining continence: Yes Do you need assistance with getting out of bed/getting out of a chair/moving?: Yes Do you have difficulty managing or taking your medications?: Yes (daughter and family manages medications)  Follow up appointments reviewed: PCP Follow-up appointment confirmed?: No (Daughter desires video appointment) Date of PCP follow-up  appointment?: 01/18/24 Follow-up Provider: [facility] Follow-up appointment confirmed?: Yes Date of Specialist follow-up appointment?: 01/13/24 Follow-Up Specialty Provider:: [facility] Do you need transportation to your follow-up appointment?: No Do you understand care options if your condition(s) worsen?: Yes-patient verbalized understanding  Discussed and offered 30 day TOC program.  Patient's daughter declines follow up in 30 day program and CCM at this time.  The patient has been provided with contact information for the care management team and has been advised to call with any health -related questions or concerns.  The patient verbalized understanding with current plan of care.  The patient is directed to their insurance card regarding availability of benefits coverage.    Richerd Fish, RN, BSN, CCM [employer], [facility] RN Care Manager Direct Dial : [phone]

## 2024-01-06 ENCOUNTER — Telehealth: Payer: Self-pay

## 2024-01-06 NOTE — Telephone Encounter (Signed)
 Copied from CRM [id number]. Topic: Clinical - Request for Lab/Test Order >> Jan 06, 2024  9:31 AM Travis F wrote: Reason for CRM: Lanae with [facility] is calling in checking on the status of a lab requisition they sent over on 01/04/24. He says that he received confirmation that the fax was received but he has not received anything back. He wants to know when he can expect that back. He says they are processing all the lab orders tomorrow 01/07/24, and they need it no later than tomorrow morning.

## 2024-01-06 NOTE — Telephone Encounter (Signed)
 Faxed this morning.

## 2024-01-08 DIAGNOSIS — I4891 Unspecified atrial fibrillation: Secondary | ICD-10-CM | POA: Diagnosis not present

## 2024-01-08 DIAGNOSIS — W19XXXA Unspecified fall, initial encounter: Secondary | ICD-10-CM | POA: Diagnosis not present

## 2024-01-11 ENCOUNTER — Ambulatory Visit: Payer: Self-pay

## 2024-01-12 DIAGNOSIS — G4733 Obstructive sleep apnea (adult) (pediatric): Secondary | ICD-10-CM | POA: Diagnosis not present

## 2024-01-12 DIAGNOSIS — Z0000 Encounter for general adult medical examination without abnormal findings: Secondary | ICD-10-CM | POA: Diagnosis not present

## 2024-01-13 ENCOUNTER — Ambulatory Visit (INDEPENDENT_AMBULATORY_CARE_PROVIDER_SITE_OTHER): Admitting: Internal Medicine

## 2024-01-13 ENCOUNTER — Ambulatory Visit (INDEPENDENT_AMBULATORY_CARE_PROVIDER_SITE_OTHER): Admitting: Otolaryngology

## 2024-01-13 VITALS — BP 168/81 | HR 74 | Temp 98.6°F | Ht 66.0 in | Wt 153.0 lb

## 2024-01-13 DIAGNOSIS — K219 Gastro-esophageal reflux disease without esophagitis: Secondary | ICD-10-CM

## 2024-01-13 DIAGNOSIS — Z7901 Long term (current) use of anticoagulants: Secondary | ICD-10-CM

## 2024-01-13 DIAGNOSIS — K922 Gastrointestinal hemorrhage, unspecified: Secondary | ICD-10-CM

## 2024-01-13 DIAGNOSIS — D649 Anemia, unspecified: Secondary | ICD-10-CM | POA: Diagnosis not present

## 2024-01-13 DIAGNOSIS — K625 Hemorrhage of anus and rectum: Secondary | ICD-10-CM

## 2024-01-13 DIAGNOSIS — D500 Iron deficiency anemia secondary to blood loss (chronic): Secondary | ICD-10-CM

## 2024-01-13 NOTE — Patient Instructions (Addendum)
 I am going to order blood work at the hospital to check your iron levels.  Assuming your iron levels are low, I want you to start taking over-the-counter iron 65 mg (325 mg ferrous sulfate ) daily.  Continue on pantoprazole  for your chronic acid reflux.  Follow-up in 2 to 3 months.  It was very nice seeing you again today.  Dr. Cindie

## 2024-01-13 NOTE — Progress Notes (Signed)
 Referring Provider: Antonetta Rollene BRAVO, MD Primary Care Physician:  Antonetta Rollene BRAVO, MD Primary GI:  Dr. Cindie  Chief Complaint  Patient presents with   Follow-up    Follow up from ED visit and anemia    HPI:   Kathryn Beck is a 68 y.o. female who presents to the clinic today for hospital follow-up visit.  She has an extensive past medical history significant for suspicious Right exophytic renal lesion seen in CTA 11/2023, GERD, pancreatitis, moderate aortic stenosis (ECHO 05/2023) recent new onset A-fib on apixaban  with cardioversion 10/27/2023, PAD (s/p R BKA in 05/2022, prior lithotripsy and stenting of the left superficial femoral and popliteal artery in 06/2022 and ultimately L BKA in 09/2022), HTN, HLD, Type 2 DM, bilateral adrenal adenoma (followed by Endocrinology), chronic HFpEF, OSA, ESRD (MWF), history of TIA's, history of recurrent chest pain  (cath in 12/2018 showing normal coronary arteries) recent hospitalization for left hand I&D, MSSA infection on antibiotics IV Ancef .  Recent admission to [facility] 12/29/2023 due to recurrent lower GI bleeding.  In the ER found to have hemoglobin of 7.2.  Noted bright red blood per rectum.  She had 2 prior admissions for similar issues  EGD 12/03/23 - Normal esophagus - Medium amount of food residue in stomach - Normal duodenal bulb, first portion of duodenum and second portion of duodenum  Colonoscopy 12/05/2023: - Nonbleeding internal hemorrhoids -Diverticulosis in the sigmoid colon, hepatic flexure, ascending colon -Single 10 mm polyp in the ascending colon removed, tubular adenoma -Single 5 mm polyp in the transverse colon removed, tubular adenoma -Likely bled from right sided diverticular disease versus benign hemorrhoidal. -If evidence of further bleeding, would recommend CT angio and consider IR consultation - Next colonoscopy in 3 years  Colonoscopy 12/17/23: -post polypectomy bleeding (ascending polypectomy  site) closed with hemostasis clip X 1 -melanosis coli -six 4-65mm polyps in rectum, sigmoid colon, desc colon, hepatic flexure removed with hot snare -diverticulosis -non-bleeding ext/int hemorrhoids -multiple tubular adenomas -next colonoscopy 3 years.    Colonoscopy 01/01/2024 with fresh blood in the transverse and descending colons.  After careful lavage, 4 postpolypectomy ulcers found.  All 4 sites were clipped.  Since discharge, she denies any further rectal bleeding.  Hemoglobin yesterday stable at 7.2.  Has this checked regularly with dialysis.  Chronically on Eliquis  which has been decreased to 2.5 mg twice daily.  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  Last Assessment & Plan:   Multiple polyps with surveillance due in 2021.  IMO  SNOMED Dx Update Oct 2024     History of pneumonia 2019   Hypoxia 04/03/2019   Memory loss    Mixed hyperlipidemia    Not currently working due to disabled status 06/13/2018   Formatting of this note might be different from the original.  Last Assessment & Plan:   Formatting of this note might be different from the original.  Reports being  disabled since  April/May 2019 , hospitalized then in a niursing home, now on dialysis and is unable to work     Obesity    Pancreatitis    Peritoneal dialysis catheter in place    Pneumonia due to COVID-19 virus 04/02/2019   Sleep apnea    Noncompliant with CPAP   Stroke (HCC)    mini stroke   Type 2 diabetes mellitus (HCC)     Past Surgical History:  Procedure Laterality Date   ABDOMINAL AORTOGRAM W/LOWER EXTREMITY N/A 04/30/2022   Procedure: ABDOMINAL AORTOGRAM W/LOWER EXTREMITY;  Surgeon: Magda Debby SAILOR, MD;  Location: [facility];  Service: Cardiovascular;  Laterality: N/A;   ABDOMINAL AORTOGRAM W/LOWER EXTREMITY N/A 07/21/2022   Procedure: ABDOMINAL AORTOGRAM W/LOWER EXTREMITY;  Surgeon: Serene Gaile ORN, MD;  Location: [facility];  Service: Cardiovascular;  Laterality: N/A;   ABDOMINAL HYSTERECTOMY     ACHILLES TENDON LENGTHENING  08/15/2022   Procedure: ACHILLES TENDON LENGTHENING;  Surgeon: Silva Juliene SAUNDERS, DPM;  Location: [facility] OR;  Service: Podiatry;;   AMPUTATION Right 05/29/2022   Procedure: RIGHT BELOW THE KNEE AMPUTATION;  Surgeon: Harden Jerona GAILS, MD;  Location: [facility] OR;  Service: Orthopedics;  Laterality: Right;   AMPUTATION Left 09/04/2022   Procedure: AMPUTATION FOOT, serial irrigation;  Surgeon: Joya Stabs, DPM;  Location: [facility] OR;  Service: Podiatry;  Laterality: Left;  Surgical team to do block   AMPUTATION Left 10/07/2022   Procedure: LEFT BELOW KNEE AMPUTATION;  Surgeon: Harden Jerona GAILS, MD;  Location: [facility] OR;  Service: Orthopedics;  Laterality: Left;   AMPUTATION FINGER Left 09/29/2023   Procedure: AMPUTATION, FINGER;  Surgeon: Harden Jerona GAILS, MD;  Location[facility] OR;  Service: Orthopedics;  Laterality: Left;  LEFT HAND LONG FINGER RAY AMPUTATION   AV FISTULA PLACEMENT Left 09/02/2017   Procedure: creation of left arm ARTERIOVENOUS (AV) FISTULA;  Surgeon: Serene Gaile ORN, MD;  Location: [facility] OR;  Service: Vascular;  Laterality: Left;   COLONOSCOPY  2008   Dr. Harvey: normal     COLONOSCOPY N/A 12/18/2016   Dr. Harvey: multiple tubular adenomas, internal hemorrhoids. Surveillance in 3 years    COLONOSCOPY N/A 12/05/2023   Procedure: COLONOSCOPY;  Surgeon: Cindie Carlin POUR, DO;  Location: [facility];  Service: Endoscopy;  Laterality: N/A;   COLONOSCOPY N/A 12/17/2023   Procedure: COLONOSCOPY;  Surgeon: Shaaron Lamar HERO, MD;  Location: [facility];  Service: Endoscopy;  Laterality: N/A;   COLONOSCOPY N/A 01/01/2024   Procedure: COLONOSCOPY;  Surgeon: Cindie Carlin POUR, DO;  Location: [facility];  Service: Endoscopy;  Laterality: N/A;   ESOPHAGEAL DILATION N/A 10/13/2015   Procedure: ESOPHAGEAL DILATION;  Surgeon: Claudis RAYMOND Rivet, MD;  Location: [facility];  Service: Endoscopy;  Laterality: N/A;   ESOPHAGEAL DILATION N/A 12/03/2023   Procedure: DILATION, ESOPHAGUS;  Surgeon: Cindie Carlin POUR, DO;  Location: [facility];  Service: Endoscopy;  Laterality: N/A;   ESOPHAGOGASTRODUODENOSCOPY N/A 10/13/2015   Dr. Rivet: chronic gastritis on path, no H.pylori. Empiric dilation    ESOPHAGOGASTRODUODENOSCOPY N/A 12/18/2016   Dr. Harvey:  mild gastritis. BRAVO study revealed uncontrolled GERD. Dysphagia secondary to uncontrolled reflux   ESOPHAGOGASTRODUODENOSCOPY N/A 12/03/2023   Procedure: EGD (ESOPHAGOGASTRODUODENOSCOPY);  Surgeon: Cindie Carlin POUR, DO;  [facility]: [facility];  Service: Endoscopy;  Laterality: N/A;   FOOT SURGERY Bilateral    nerve     INCISION AND DRAINAGE OF WOUND Left 10/30/2023   Procedure: IRRIGATION AND DEBRIDEMENT WOUND;  Surgeon: Arlinda Buster, MD;  Location: [facility] OR;  Service: Orthopedics;  Laterality: Left;  LEFT HAND WOUND   LEFT HEART CATH AND CORONARY ANGIOGRAPHY N/A 12/29/2018   Procedure: LEFT HEART CATH AND CORONARY ANGIOGRAPHY;  Surgeon: Dann Candyce RAMAN, MD;  Location: [facility] INVASIVE CV LAB;  Service: Cardiovascular;  Laterality: N/A;   LOWER EXTREMITY ANGIOGRAPHY Right 05/04/2022   Procedure: Lower Extremity Angiography;  Surgeon:  Lanis Fonda BRAVO, MD;  Location: [facility] INVASIVE CV LAB;  Service: Cardiovascular;  Laterality: Right;   LUNG BIOPSY     MASS EXCISION Right 01/09/2013   Procedure: EXCISION OF NEOPLASM OF RIGHT  AXILLA  AND EXCISION OF NEOPLASM OF LEFT AXILLA;  Surgeon: Oneil DELENA Budge, MD;  Location: [facility];  Service: General;  Laterality: Right;  procedure end @ 08:23   MYRINGOTOMY WITH TUBE PLACEMENT Bilateral 04/28/2017   Procedure: BILATERAL MYRINGOTOMY WITH TUBE PLACEMENT;  Surgeon: Karis Clunes, MD;  Location: [facility] OR;  Service: ENT;  Laterality: Bilateral;   PERIPHERAL VASCULAR BALLOON ANGIOPLASTY Right 05/04/2022   Procedure: PERIPHERAL VASCULAR BALLOON ANGIOPLASTY;  Surgeon: Lanis Fonda BRAVO, MD;  Location: [facility] INVASIVE CV LAB;  Service: Cardiovascular;  Laterality: Right;  PT   PERIPHERAL VASCULAR INTERVENTION Right 05/04/2022   Procedure: PERIPHERAL VASCULAR INTERVENTION;  Surgeon: Lanis Fonda BRAVO, MD;  Location: [facility] INVASIVE CV LAB;  Service: Cardiovascular;  Laterality: Right;  SFA   PERIPHERAL VASCULAR INTERVENTION Left 07/21/2022   Procedure: PERIPHERAL VASCULAR INTERVENTION;  Surgeon: Serene Gaile ORN, MD;  Location: [facility];  Service: Cardiovascular;  Laterality: Left;   REVISION OF ARTERIOVENOUS GORETEX GRAFT Left 05/04/2018   Procedure: TRANSPOSITION OF CEPHALIC VEIN ARTERIOVENOUS FISTULA LEFT ARM;  Surgeon: Oris Krystal FALCON, MD;  Location: [facility] OR;  Service: Vascular;  Laterality: Left;   SAVORY DILATION N/A 12/18/2016   Procedure: SAVORY DILATION;  Surgeon: Harvey Margo CROME, MD;  [facility]: [facility];  Service: Endoscopy;  Laterality: N/A;   TRANSMETATARSAL AMPUTATION Left 08/15/2022   Procedure: TRANSMETATARSAL AMPUTATION;  Surgeon: Silva Juliene SAUNDERS, DPM;  Location: [facility] OR;  Service: Podiatry;  Laterality: Left;    Current Outpatient Medications  Medication Sig Dispense Refill   acetaminophen  (TYLENOL ) 500 MG tablet Take 1,000 mg by mouth every 6 (six) hours as needed for mild pain (pain score 1-3).      amLODipine  (NORVASC ) 10 MG tablet Take 0.5 tablets (5 mg total) by mouth daily. 45 tablet 2   B Complex-C-Folic Acid  (RENA-VITE RX) 1 MG TABS Take 1 tablet by mouth daily.     busPIRone  (BUSPAR ) 7.5 MG tablet Take 1 tablet (7.5 mg total) by mouth 2 (two) times daily. 60 tablet 3   calcitRIOL  (ROCALTROL ) 0.5 MCG capsule Take 2 capsules (1 mcg total) by mouth every Monday, Wednesday, and Friday with hemodialysis.     cinacalcet  (SENSIPAR ) 30 MG tablet Take 3 tablets (90 mg total) by mouth every Monday, Wednesday, and Friday.     ezetimibe  (ZETIA ) 10 MG tablet Take 1 tablet (10 mg total) by mouth daily. 28 tablet 11   insulin  aspart (NOVOLOG ) 100 UNIT/ML injection Inject 0-6 Units into  the skin 3 (three) times daily with meals. CBG 70 - 120: 0 units  CBG 121 - 150: 0 units  CBG 151 - 200: 1 unit  CBG 201-250: 2 units  CBG 251-300: 3 units  CBG 301-350: 4 units  CBG 351-400: 5 units  CBG > 400: Give 10 units and call MD 10 mL 11   insulin  glargine, 2 Unit Dial , (TOUJEO  MAX SOLOSTAR) 300 UNIT/ML Solostar Pen Inject 30 Units into the skin daily. May need to slowly increase the dose depending upon your blood sugar, follow-up with PCP 24 mL 1   metoprolol  succinate (TOPROL -XL) 50 MG 24 hr tablet TAKE ONE TABLET BY MOUTH ONCE DAILY WITH FOOD (TAKE AFTER DIALYSIS) 28 tablet 11   nitroGLYCERIN  (NITRODUR - DOSED IN MG/24 HR) 0.2 mg/hr patch Place 1 patch (0.2 mg total) onto the skin daily. 30 patch 12   ondansetron  (ZOFRAN ) 4 MG tablet Take 4 mg by mouth every 8 (eight) hours as needed for nausea or vomiting.     pantoprazole  (PROTONIX ) 40 MG tablet Take 1 tablet (40 mg total) by mouth daily. 30 tablet 1   sertraline  (ZOLOFT ) 50 MG tablet Take 1.5 tablets (75 mg total) by mouth daily. Take one and a half tablets once daily     torsemide  (DEMADEX ) 100 MG tablet Take 1 tablet (100 mg total) by mouth every Tuesday, Thursday, Saturday, and Sunday. Non-dialysis days     [name]  500 MG chewable tablet Chew  1-2 tablets (500-1,000 mg total) by mouth See admin instructions. Take 1000mg  (2 tablets) by mouth with meals and 500mg  (1 tablet) with snacks 90 tablet 0   apixaban  (ELIQUIS ) 2.5 MG TABS tablet Take 1 tablet (2.5 mg total) by mouth 2 (two) times daily.     No current facility-administered medications for this visit.   Facility-Administered Medications Ordered in Other Visits  Medication Dose Route Frequency Provider Last Rate Last Admin   0.9 %  sodium chloride  infusion   Intravenous Continuous Lockamy, Randi L, NP-C 20 mL/hr at 01/01/24 0904 Continued from Pre-op  at 01/01/24 0904    Allergies as of 01/13/2024 - Review Complete 01/13/2024  Allergen Reaction Noted   Ace inhibitors Anaphylaxis and Swelling 06/07/2007   Penicillins Itching and Swelling 06/07/2007   Statins Other (See Comments) 12/24/2012   Albuterol  Swelling 08/31/2017    Family History  Problem Relation Age of Onset   Hypertension Father    Hypercholesterolemia Father    Arthritis Father    Hypertension Sister    Hypercholesterolemia Sister    Breast cancer Sister    Hypertension Sister    Colon cancer Neg Hx    Colon polyps Neg Hx     Social History   Socioeconomic History   Marital status: Married    Spouse name: Tienna Bienkowski   Number of children: 2   Years of education: 12   Highest education level: Some college, no degree  Occupational History   Occupation: retired   Tobacco Use   Smoking status: Never    Passive exposure: Never   Smokeless tobacco: Never   Tobacco comments:    Verified by Daughter, [name]  Vaping Use   Vaping status: Never Used  Substance and Sexual Activity   Alcohol use: No   Drug use: No   Sexual activity: Not Currently    Partners: Male  Other Topics Concern   Not on file  Social History Narrative   Lives alone with husband    Right handed  Caffeine-1/2 daily   Social Drivers of Health   Financial Resource Strain: Low Risk  (11/24/2023)   Overall  Financial Resource Strain (CARDIA)    Difficulty of Paying Living Expenses: Not hard at all  Food Insecurity: No Food Insecurity (12/30/2023)   Hunger Vital Sign    Worried About Running Out of Food in the Last Year: Never true    Ran Out of Food in the Last Year: Never true  Transportation Needs: No Transportation Needs (12/30/2023)   PRAPARE - Administrator, Civil Service (Medical): No    Lack of Transportation (Non-Medical): No  Physical Activity: Inactive (11/24/2023)   Exercise Vital Sign    Days of Exercise per Week: 0 days    Minutes of Exercise per Session: Not on file  Stress: Stress Concern Present (11/24/2023)   [employer] of Occupational Health - Occupational Stress Questionnaire    Feeling of Stress: To some extent  Social Connections: Moderately Integrated (12/30/2023)   Social Connection and Isolation Panel    Frequency of Communication with Friends and Family: More than three times a week    Frequency of Social Gatherings with Friends and Family: More than three times a week    Attends Religious Services: 1 to 4 times per year    Active Member of [employer] or Organizations: No    Attends Banker Meetings: Never    Marital Status: Married  Recent Concern: Social Connections - Moderately Isolated (11/25/2023)   Social Connection and Isolation Panel    Frequency of Communication with Friends and Family: Three times a week    Frequency of Social Gatherings with Friends and Family: Three times a week    Attends Religious Services: Patient declined    Active Member of Clubs or Organizations: No    Attends Banker Meetings: Never    Marital Status: Married    Subjective: Review of Systems  Constitutional:  Negative for chills and fever.  HENT:  Negative for congestion and hearing loss.   Eyes:  Negative for blurred vision and double vision.  Respiratory:  Negative for cough and shortness of breath.   Cardiovascular:  Negative for chest  pain and palpitations.  Gastrointestinal:  Negative for abdominal pain, blood in stool, constipation, diarrhea, heartburn, melena and vomiting.  Genitourinary:  Negative for dysuria and urgency.  Musculoskeletal:  Negative for joint pain and myalgias.  Skin:  Negative for itching and rash.  Neurological:  Negative for dizziness and headaches.  Psychiatric/Behavioral:  Negative for depression. The patient is not nervous/anxious.      Objective: BP (!) 168/81   Pulse 74   Temp 98.6 F (37 C)   Ht 5' 6 (1.676 m)   Wt 153 lb (69.4 kg)   BMI 24.69 kg/m  Physical Exam Constitutional:      Appearance: Normal appearance.  HENT:     Head: Normocephalic and atraumatic.  Eyes:     Extraocular Movements: Extraocular movements intact.     Conjunctiva/sclera: Conjunctivae normal.  Cardiovascular:     Rate and Rhythm: Normal rate and regular rhythm.     Heart sounds: Murmur heard.  Pulmonary:     Effort: Pulmonary effort is normal.     Breath sounds: Normal breath sounds.  Abdominal:     General: Bowel sounds are normal.     Palpations: Abdomen is soft.  Musculoskeletal:        General: No swelling. Normal range of motion.     Cervical  back: Normal range of motion and neck supple.     Comments: Left arm wrapped in bandage.  Multiple digits appeared dusky/black consistent with gangrene  Skin:    General: Skin is warm and dry.     Coloration: Skin is not jaundiced.  Neurological:     General: No focal deficit present.     Mental Status: She is alert and oriented to person, place, and time.  Psychiatric:        Mood and Affect: Mood normal.        Behavior: Behavior normal.      Assessment: *Recurrent lower GI bleeding *Acute on chronic anemia *Chronic GERD *Chronic systemic anticoagulation with Eliquis   Plan: Discussed in depth with patient today.  No further bleeding since hospital discharge.  Hemoglobin stable at 7.2 yesterday.  This will continue to be monitored with  dialysis.  Transfuse as needed.  Will check iron studies.  May benefit from oral iron therapy.  If she has further rectal bleeding, we may need to consider discontinuation of her Eliquis  going forward.  Follow-up in 2 to 3 months.  01/13/2024 9:52 AM

## 2024-01-14 DIAGNOSIS — G4733 Obstructive sleep apnea (adult) (pediatric): Secondary | ICD-10-CM | POA: Diagnosis not present

## 2024-01-17 ENCOUNTER — Observation Stay (HOSPITAL_COMMUNITY)
Admission: EM | Admit: 2024-01-17 | Discharge: 2024-01-19 | Disposition: A | Discharge status: Home Health | Source: Ambulatory Visit | Attending: Internal Medicine | Admitting: Internal Medicine

## 2024-01-17 ENCOUNTER — Other Ambulatory Visit: Payer: Self-pay

## 2024-01-17 ENCOUNTER — Encounter (HOSPITAL_COMMUNITY): Payer: Self-pay

## 2024-01-17 DIAGNOSIS — G8929 Other chronic pain: Secondary | ICD-10-CM | POA: Insufficient documentation

## 2024-01-17 DIAGNOSIS — D6869 Other thrombophilia: Secondary | ICD-10-CM | POA: Diagnosis not present

## 2024-01-17 DIAGNOSIS — Z89511 Acquired absence of right leg below knee: Secondary | ICD-10-CM | POA: Diagnosis not present

## 2024-01-17 DIAGNOSIS — E1122 Type 2 diabetes mellitus with diabetic chronic kidney disease: Secondary | ICD-10-CM | POA: Insufficient documentation

## 2024-01-17 DIAGNOSIS — Z794 Long term (current) use of insulin: Secondary | ICD-10-CM | POA: Insufficient documentation

## 2024-01-17 DIAGNOSIS — K921 Melena: Secondary | ICD-10-CM | POA: Insufficient documentation

## 2024-01-17 DIAGNOSIS — Z89519 Acquired absence of unspecified leg below knee: Secondary | ICD-10-CM | POA: Diagnosis not present

## 2024-01-17 DIAGNOSIS — E1165 Type 2 diabetes mellitus with hyperglycemia: Secondary | ICD-10-CM | POA: Insufficient documentation

## 2024-01-17 DIAGNOSIS — H9190 Unspecified hearing loss, unspecified ear: Secondary | ICD-10-CM | POA: Diagnosis not present

## 2024-01-17 DIAGNOSIS — Z992 Dependence on renal dialysis: Secondary | ICD-10-CM | POA: Insufficient documentation

## 2024-01-17 DIAGNOSIS — E876 Hypokalemia: Secondary | ICD-10-CM | POA: Insufficient documentation

## 2024-01-17 DIAGNOSIS — E785 Hyperlipidemia, unspecified: Secondary | ICD-10-CM | POA: Insufficient documentation

## 2024-01-17 DIAGNOSIS — Z7982 Long term (current) use of aspirin: Secondary | ICD-10-CM | POA: Insufficient documentation

## 2024-01-17 DIAGNOSIS — G4733 Obstructive sleep apnea (adult) (pediatric): Secondary | ICD-10-CM | POA: Diagnosis not present

## 2024-01-17 DIAGNOSIS — I96 Gangrene, not elsewhere classified: Secondary | ICD-10-CM | POA: Diagnosis present

## 2024-01-17 DIAGNOSIS — Z89512 Acquired absence of left leg below knee: Secondary | ICD-10-CM

## 2024-01-17 DIAGNOSIS — Z8673 Personal history of transient ischemic attack (TIA), and cerebral infarction without residual deficits: Secondary | ICD-10-CM | POA: Diagnosis not present

## 2024-01-17 DIAGNOSIS — D500 Iron deficiency anemia secondary to blood loss (chronic): Principal | ICD-10-CM | POA: Insufficient documentation

## 2024-01-17 DIAGNOSIS — D649 Anemia, unspecified: Principal | ICD-10-CM

## 2024-01-17 DIAGNOSIS — I480 Paroxysmal atrial fibrillation: Secondary | ICD-10-CM | POA: Diagnosis not present

## 2024-01-17 DIAGNOSIS — I509 Heart failure, unspecified: Secondary | ICD-10-CM | POA: Insufficient documentation

## 2024-01-17 DIAGNOSIS — D631 Anemia in chronic kidney disease: Secondary | ICD-10-CM | POA: Diagnosis not present

## 2024-01-17 DIAGNOSIS — R531 Weakness: Secondary | ICD-10-CM

## 2024-01-17 DIAGNOSIS — G894 Chronic pain syndrome: Secondary | ICD-10-CM | POA: Diagnosis present

## 2024-01-17 DIAGNOSIS — Z8616 Personal history of COVID-19: Secondary | ICD-10-CM | POA: Diagnosis not present

## 2024-01-17 DIAGNOSIS — Z79899 Other long term (current) drug therapy: Secondary | ICD-10-CM | POA: Insufficient documentation

## 2024-01-17 DIAGNOSIS — F411 Generalized anxiety disorder: Secondary | ICD-10-CM | POA: Insufficient documentation

## 2024-01-17 DIAGNOSIS — D62 Acute posthemorrhagic anemia: Secondary | ICD-10-CM

## 2024-01-17 DIAGNOSIS — E1169 Type 2 diabetes mellitus with other specified complication: Secondary | ICD-10-CM | POA: Diagnosis present

## 2024-01-17 DIAGNOSIS — I739 Peripheral vascular disease, unspecified: Secondary | ICD-10-CM | POA: Diagnosis not present

## 2024-01-17 DIAGNOSIS — N186 End stage renal disease: Secondary | ICD-10-CM | POA: Insufficient documentation

## 2024-01-17 DIAGNOSIS — K922 Gastrointestinal hemorrhage, unspecified: Secondary | ICD-10-CM

## 2024-01-17 DIAGNOSIS — E11649 Type 2 diabetes mellitus with hypoglycemia without coma: Secondary | ICD-10-CM | POA: Insufficient documentation

## 2024-01-17 DIAGNOSIS — I132 Hypertensive heart and chronic kidney disease with heart failure and with stage 5 chronic kidney disease, or end stage renal disease: Secondary | ICD-10-CM | POA: Diagnosis not present

## 2024-01-17 DIAGNOSIS — I10 Essential (primary) hypertension: Secondary | ICD-10-CM | POA: Diagnosis not present

## 2024-01-17 DIAGNOSIS — L930 Discoid lupus erythematosus: Secondary | ICD-10-CM | POA: Diagnosis not present

## 2024-01-17 DIAGNOSIS — R197 Diarrhea, unspecified: Secondary | ICD-10-CM | POA: Diagnosis present

## 2024-01-17 LAB — COMPREHENSIVE METABOLIC PANEL WITH GFR
ALT: 5 U/L (ref 0–44)
AST: 25 U/L (ref 15–41)
Albumin: 3.5 g/dL (ref 3.5–5.0)
Alkaline Phosphatase: 95 U/L (ref 38–126)
Anion gap: 11 (ref 5–15)
BUN: 16 mg/dL (ref 8–23)
CO2: 30 mmol/L (ref 22–32)
Calcium: 8.5 mg/dL — ABNORMAL LOW (ref 8.9–10.3)
Chloride: 100 mmol/L (ref 98–111)
Creatinine, Ser: 2 mg/dL — ABNORMAL HIGH (ref 0.44–1.00)
GFR, Estimated: 27 mL/min — ABNORMAL LOW (ref >60)
Glucose, Bld: 79 mg/dL (ref 70–99)
Potassium: 3.2 mmol/L — ABNORMAL LOW (ref 3.5–5.1)
Sodium: 140 mmol/L (ref 135–145)
Total Bilirubin: 0.3 mg/dL (ref 0.0–1.2)
Total Protein: 6.3 g/dL — ABNORMAL LOW (ref 6.5–8.1)

## 2024-01-17 LAB — IRON AND TIBC
Iron: 30 ug/dL (ref 28–170)
Saturation Ratios: 17 % (ref 10.4–31.8)
TIBC: 175 ug/dL — ABNORMAL LOW (ref 250–450)
UIBC: 145 ug/dL

## 2024-01-17 LAB — CBC WITH DIFFERENTIAL/PLATELET
Abs Immature Granulocytes: 0.06 K/uL (ref 0.00–0.07)
Basophils Absolute: 0 K/uL (ref 0.0–0.1)
Basophils Relative: 0 %
Eosinophils Absolute: 0 K/uL (ref 0.0–0.5)
Eosinophils Relative: 1 %
HCT: 23.3 % — ABNORMAL LOW (ref 36.0–46.0)
Hemoglobin: 7 g/dL — ABNORMAL LOW (ref 12.0–15.0)
Immature Granulocytes: 1 %
Lymphocytes Relative: 12 %
Lymphs Abs: 0.9 K/uL (ref 0.7–4.0)
MCH: 26.2 pg (ref 26.0–34.0)
MCHC: 30 g/dL (ref 30.0–36.0)
MCV: 87.3 fL (ref 80.0–100.0)
Monocytes Absolute: 0.5 K/uL (ref 0.1–1.0)
Monocytes Relative: 7 %
Neutro Abs: 6.2 K/uL (ref 1.7–7.7)
Neutrophils Relative %: 79 %
Platelets: 325 K/uL (ref 150–400)
RBC: 2.67 MIL/uL — ABNORMAL LOW (ref 3.87–5.11)
RDW: 17 % — ABNORMAL HIGH (ref 11.5–15.5)
WBC: 7.7 K/uL (ref 4.0–10.5)
nRBC: 1.6 % — ABNORMAL HIGH (ref 0.0–0.2)

## 2024-01-17 LAB — FERRITIN: Ferritin: 1205 ng/mL — ABNORMAL HIGH (ref 11–307)

## 2024-01-17 LAB — PROTIME-INR
INR: 1 (ref 0.8–1.2)
Prothrombin Time: 14.1 s (ref 11.4–15.2)

## 2024-01-17 LAB — CBG MONITORING, ED: Glucose-Capillary: 85 mg/dL (ref 70–99)

## 2024-01-17 LAB — POC OCCULT BLOOD, ED: Fecal Occult Blood: POSITIVE — AB

## 2024-01-17 LAB — PREPARE RBC (CROSSMATCH)

## 2024-01-17 LAB — GLUCOSE, CAPILLARY: Glucose-Capillary: 80 mg/dL (ref 70–99)

## 2024-01-17 MED ORDER — PANTOPRAZOLE SODIUM 40 MG PO TBEC
40.0000 mg | DELAYED_RELEASE_TABLET | Freq: Every evening | ORAL | Status: DC
  Administered 2024-01-18: 40 mg via ORAL
  Filled 2024-01-17: qty 1

## 2024-01-17 MED ORDER — SODIUM CHLORIDE 0.9% IV SOLUTION: Freq: Once | INTRAVENOUS | Status: AC

## 2024-01-17 MED ORDER — OXYCODONE HCL 5 MG PO TABS: 5.0000 mg | ORAL_TABLET | Freq: Three times a day (TID) | ORAL | Status: DC | PRN

## 2024-01-17 MED ORDER — FENTANYL CITRATE (PF) 100 MCG/2ML IJ SOLN: 12.5000 ug | INTRAMUSCULAR | Status: DC | PRN

## 2024-01-17 MED ORDER — HYDRALAZINE HCL 20 MG/ML IJ SOLN: 10.0000 mg | INTRAMUSCULAR | Status: DC | PRN

## 2024-01-17 MED ORDER — ACETAMINOPHEN 325 MG PO TABS: 650.0000 mg | ORAL_TABLET | Freq: Four times a day (QID) | ORAL | Status: DC | PRN

## 2024-01-17 MED ORDER — POTASSIUM CHLORIDE CRYS ER 20 MEQ PO TBCR
40.0000 meq | EXTENDED_RELEASE_TABLET | Freq: Once | ORAL | Status: AC
Start: 2024-01-17 — End: 2024-01-17
  Administered 2024-01-17: 40 meq via ORAL
  Filled 2024-01-17: qty 2

## 2024-01-17 MED ORDER — BISACODYL 5 MG PO TBEC: 5.0000 mg | DELAYED_RELEASE_TABLET | Freq: Every day | ORAL | Status: DC | PRN

## 2024-01-17 MED ORDER — PANTOPRAZOLE SODIUM 40 MG IV SOLR
40.0000 mg | Freq: Once | INTRAVENOUS | Status: AC
  Administered 2024-01-17: 40 mg via INTRAVENOUS
  Filled 2024-01-17: qty 10

## 2024-01-17 MED ORDER — INSULIN ASPART 100 UNIT/ML IJ SOLN
0.0000 [IU] | Freq: Three times a day (TID) | INTRAMUSCULAR | Status: DC
  Administered 2024-01-19: 2 [IU] via SUBCUTANEOUS

## 2024-01-17 MED ORDER — ACETAMINOPHEN 650 MG RE SUPP: 650.0000 mg | Freq: Four times a day (QID) | RECTAL | Status: DC | PRN

## 2024-01-17 MED ORDER — PANTOPRAZOLE SODIUM 40 MG PO TBEC
40.0000 mg | DELAYED_RELEASE_TABLET | Freq: Every evening | ORAL | Status: DC
Start: 2024-01-17 — End: 2024-01-17
  Filled 2024-01-17: qty 1

## 2024-01-17 MED ORDER — INSULIN ASPART 100 UNIT/ML IJ SOLN: 2.0000 [IU] | Freq: Three times a day (TID) | INTRAMUSCULAR | Status: DC

## 2024-01-17 NOTE — ED Notes (Signed)
 Pt has had 3 soft bowel movements today.

## 2024-01-17 NOTE — ED Notes (Signed)
 Pt is a dialysis pt and makes little to no urine.

## 2024-01-17 NOTE — H&P (Signed)
 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  She had a colonoscopy performed 01/01/2024 with Dr. Cindie with findings of diverticulosis, nonbleeding internal hemorrhoids and 4 ulcers in the descending colon and in the transverse colon that were clipped.  She was discharged home in stable condition with a hemoglobin of 7.4.  Hemoglobin has been monitored and dialysis.  She was sent to ER by EMS after dialysis today for hemoglobin of 6.4.  Recheck hemoglobin in the ED 7.0.  Patient is being typed and crossed and transfused 1 unit PRBC and admission requested for observation.  GI was consulted as well and recommended observation.    Past Medical History:  Diagnosis Date   Abnormal MRI, cervical spine 03/04/2020   Acid reflux    Acute osteomyelitis of toe of left foot (HCC) 08/13/2022   Amputated left leg (HCC)    bilateral BKA   Anemia    Arthritis    Axillary masses    Soft tissue - status post excision   Back pain    CHF (congestive heart failure) (HCC)    COVID-19 virus infection 04/06/2019   CVA (cerebral vascular accident) (HCC) 12/17/2022   Depression    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   End-stage renal disease (HCC)    M/W/F dialysis    Essential hypertension    Headache    years ago   History of blood transfusion    History of cardiac catheterization    Normal coronary arteries October 2020   History of claustrophobia    History of colonic polyps 02/10/2017   Formatting of this note might be different from the original.  Last Assessment & Plan:   Multiple polyps with surveillance due in 2021.  IMO SNOMED Dx Update Oct 2024     History of pneumonia 2019   Hypoxia 04/03/2019   Memory loss    Mixed hyperlipidemia    Not currently working due to disabled status 06/13/2018   Formatting of this note might be different from the original.  Last Assessment & Plan:   Formatting of this note might be different from the original.  Reports being  disabled since April/May 2019 , hospitalized then in a niursing home, now on dialysis and is unable to work     Obesity    Pancreatitis    Peritoneal dialysis catheter in place    Pneumonia due to COVID-19 virus 04/02/2019   Sleep apnea    Noncompliant with CPAP   Stroke (HCC)    mini stroke   Type 2 diabetes mellitus (HCC)     Past Surgical History:  Procedure Laterality Date   ABDOMINAL AORTOGRAM W/LOWER EXTREMITY N/A 04/30/2022   Procedure: ABDOMINAL AORTOGRAM W/LOWER EXTREMITY;  Surgeon: Magda Debby SAILOR, MD;  [facility]: [facility];  Service: Cardiovascular;  Laterality: N/A;   ABDOMINAL  AORTOGRAM W/LOWER EXTREMITY N/A 07/21/2022   Procedure: ABDOMINAL AORTOGRAM W/LOWER EXTREMITY;  Surgeon: Serene Gaile ORN, MD;  Location: [facility];  Service: Cardiovascular;  Laterality: N/A;   ABDOMINAL HYSTERECTOMY     ACHILLES TENDON LENGTHENING  08/15/2022   Procedure: ACHILLES TENDON LENGTHENING;  Surgeon: Silva Juliene SAUNDERS, DPM;  Location: [facility] OR;  Service: Podiatry;;   AMPUTATION Right 05/29/2022   Procedure: RIGHT BELOW THE KNEE AMPUTATION;  Surgeon: Harden Jerona GAILS, MD;  Location[facility] OR;  Service: Orthopedics;  Laterality: Right;   AMPUTATION Left 09/04/2022   Procedure:  AMPUTATION FOOT, serial irrigation;  Surgeon: Joya Stabs, DPM;  Location: [facility] OR;  Service: Podiatry;  Laterality: Left;  Surgical team to do block   AMPUTATION Left 10/07/2022   Procedure: LEFT BELOW KNEE AMPUTATION;  Surgeon: Harden Jerona GAILS, MD;  Location: [facility] OR;  Service: Orthopedics;  Laterality: Left;   AMPUTATION FINGER Left 09/29/2023   Procedure: AMPUTATION, FINGER;  Surgeon: Harden Jerona GAILS, MD;  Location: [facility] OR;  Service: Orthopedics;  Laterality: Left;  LEFT HAND LONG FINGER RAY AMPUTATION   AV FISTULA PLACEMENT Left 09/02/2017   Procedure: creation of left arm ARTERIOVENOUS (AV) FISTULA;  Surgeon: Serene Gaile ORN, MD;  Location: [facility] OR;  Service: Vascular;  Laterality: Left;   COLONOSCOPY  2008   Dr. Harvey: normal    COLONOSCOPY N/A 12/18/2016   Dr. Harvey: multiple tubular adenomas, internal hemorrhoids. Surveillance in 3 years    COLONOSCOPY N/A 12/05/2023   Procedure: COLONOSCOPY;  Surgeon: Cindie Carlin POUR, DO;  [facility]: [facility];  Service: Endoscopy;  Laterality: N/A;   COLONOSCOPY N/A 12/17/2023   Procedure: COLONOSCOPY;  Surgeon: Shaaron Lamar HERO, MD;  [facility]: [facility];  Service: Endoscopy;  Laterality: N/A;   COLONOSCOPY N/A 01/01/2024   Procedure: COLONOSCOPY;  Surgeon: Cindie Carlin POUR, DO;  [facility];  Service: Endoscopy;  Laterality: N/A;   ESOPHAGEAL DILATION N/A 10/13/2015   Procedure: ESOPHAGEAL DILATION;  Surgeon: Claudis RAYMOND Rivet, MD;  [facility]: [facility];  Service: Endoscopy;  Laterality: N/A;   ESOPHAGEAL DILATION N/A 12/03/2023   Procedure: DILATION, ESOPHAGUS;  Surgeon: Cindie Carlin POUR, DO;  [facility];  Service: Endoscopy;  Laterality: N/A;   ESOPHAGOGASTRODUODENOSCOPY N/A 10/13/2015   Dr. Rivet: chronic gastritis on path, no H.pylori. Empiric dilation    ESOPHAGOGASTRODUODENOSCOPY N/A 12/18/2016   Dr. Harvey: mild gastritis. BRAVO study revealed uncontrolled GERD. Dysphagia secondary to uncontrolled reflux    ESOPHAGOGASTRODUODENOSCOPY N/A 12/03/2023   Procedure: EGD (ESOPHAGOGASTRODUODENOSCOPY);  Surgeon: Cindie Carlin POUR, DO;  [facility];  Service: Endoscopy;  Laterality: N/A;   FOOT SURGERY Bilateral    nerve     INCISION AND DRAINAGE OF WOUND Left 10/30/2023   Procedure: IRRIGATION AND DEBRIDEMENT WOUND;  Surgeon: Arlinda Buster, MD;  Location: [facility] OR;  Service: Orthopedics;  Laterality: Left;  LEFT HAND WOUND   LEFT HEART CATH AND CORONARY ANGIOGRAPHY N/A 12/29/2018   Procedure: LEFT HEART CATH AND CORONARY ANGIOGRAPHY;  Surgeon: Dann Candyce RAMAN, MD;  Location: [facility] INVASIVE CV LAB;  Service: Cardiovascular;  Laterality: N/A;   LOWER EXTREMITY ANGIOGRAPHY Right 05/04/2022   Procedure: Lower Extremity Angiography;  Surgeon: Lanis Fonda BRAVO, MD;  Location: [facility] INVASIVE CV LAB;  Service: Cardiovascular;  Laterality: Right;   LUNG BIOPSY     MASS EXCISION Right 01/09/2013   Procedure: EXCISION OF NEOPLASM OF RIGHT  AXILLA  AND EXCISION OF NEOPLASM OF LEFT AXILLA;  Surgeon: Oneil DELENA Budge,  MD;  Location: [facility] ORS;  Service: General;  Laterality: Right;  procedure end @ 08:23   MYRINGOTOMY WITH TUBE PLACEMENT Bilateral 04/28/2017   Procedure: BILATERAL MYRINGOTOMY WITH TUBE PLACEMENT;  Surgeon: Karis Clunes, MD;  Location: [facility] OR;  Service: ENT;  Laterality: Bilateral;   PERIPHERAL VASCULAR BALLOON ANGIOPLASTY Right 05/04/2022   Procedure: PERIPHERAL VASCULAR BALLOON ANGIOPLASTY;  Surgeon: Lanis Fonda BRAVO, MD;  Location: [facility] INVASIVE CV LAB;  Service: Cardiovascular;  Laterality: Right;  PT   PERIPHERAL VASCULAR INTERVENTION Right 05/04/2022   Procedure: PERIPHERAL VASCULAR INTERVENTION;  Surgeon: Lanis Fonda BRAVO, MD;  Location: [facility] INVASIVE CV LAB;  Service: Cardiovascular;  Laterality: Right;  SFA   PERIPHERAL VASCULAR INTERVENTION Left 07/21/2022   Procedure: PERIPHERAL VASCULAR INTERVENTION;  Surgeon: Serene Gaile ORN, MD;  [facility]: [facility];  Service: Cardiovascular;  Laterality:  Left;   REVISION OF ARTERIOVENOUS GORETEX GRAFT Left 05/04/2018   Procedure: TRANSPOSITION OF CEPHALIC VEIN ARTERIOVENOUS FISTULA LEFT ARM;  Surgeon: Oris Krystal FALCON, MD;  Location: [facility] OR;  Service: Vascular;  Laterality: Left;   SAVORY DILATION N/A 12/18/2016   Procedure: SAVORY DILATION;  Surgeon: Harvey Margo CROME, MD;  [facility];  Service: Endoscopy;  Laterality: N/A;   TRANSMETATARSAL AMPUTATION Left 08/15/2022   Procedure: TRANSMETATARSAL AMPUTATION;  Surgeon: Silva Juliene SAUNDERS, DPM;  Location: [facility] OR;  Service: Podiatry;  Laterality: Left;     reports that she has never smoked. She has never been exposed to tobacco smoke. She has never used smokeless tobacco. She reports that she does not drink alcohol and does not use drugs.  Allergies  Allergen Reactions   Ace Inhibitors Anaphylaxis and Swelling   Penicillins Itching and Swelling    Has tolerated cefazolin  on multiple occasions    Statins Other (See Comments)    Elevated LFT's   Albuterol  Swelling    Family History  Problem Relation Age of Onset   Hypertension Father    Hypercholesterolemia Father    Arthritis Father    Hypertension Sister    Hypercholesterolemia Sister    Breast cancer Sister    Hypertension Sister    Colon cancer Neg Hx    Colon polyps Neg Hx     Prior to Admission medications   Medication Sig Start Date End Date Taking? Authorizing Provider  acetaminophen  (TYLENOL ) 500 MG tablet Take 1,000 mg by mouth every 6 (six) hours as needed for mild pain (pain score 1-3).    [provider]  amLODipine  (NORVASC ) 10 MG tablet Take 0.5 tablets (5 mg total) by mouth daily. 10/28/23   Pearlean Manus, MD  apixaban  (ELIQUIS ) 2.5 MG TABS tablet Take 1 tablet (2.5 mg total) by mouth 2 (two) times daily. 01/04/24   Dominique Calvey L, MD  B Complex-C-Folic Acid  (RENA-VITE RX) 1 MG TABS Take 1 tablet by mouth daily. 04/15/22   [provider]  busPIRone  (BUSPAR ) 7.5 MG tablet Take 1 tablet (7.5  mg total) by mouth 2 (two) times daily. 11/24/23   Antonetta Rollene BRAVO, MD  calcitRIOL  (ROCALTROL ) 0.5 MCG capsule Take 2 capsules (1 mcg total) by mouth every Monday, Wednesday, and Friday with hemodialysis. 11/05/23   Caleen Burgess BROCKS, MD  cinacalcet  (SENSIPAR ) 30 MG tablet Take 3 tablets (90 mg total) by mouth every Monday, Wednesday, and Friday. 12/08/23   Roosevelt Bisher L, MD  ezetimibe  (ZETIA ) 10 MG tablet Take 1 tablet (10 mg total) by mouth daily. 04/27/23   Antonetta Rollene BRAVO, MD  insulin  aspart (NOVOLOG )  100 UNIT/ML injection Inject 0-6 Units into the skin 3 (three) times daily with meals. CBG 70 - 120: 0 units  CBG 121 - 150: 0 units  CBG 151 - 200: 1 unit  CBG 201-250: 2 units  CBG 251-300: 3 units  CBG 301-350: 4 units  CBG 351-400: 5 units  CBG > 400: Give 10 units and call MD 11/12/23   Shahmehdi, Seyed A, MD  insulin  glargine, 2 Unit Dial , (TOUJEO  MAX SOLOSTAR) 300 UNIT/ML Solostar Pen Inject 30 Units into the skin daily. May need to slowly increase the dose depending upon your blood sugar, follow-up with PCP 01/01/23   Trixie File, MD  metoprolol  succinate (TOPROL -XL) 50 MG 24 hr tablet TAKE ONE TABLET BY MOUTH ONCE DAILY WITH FOOD (TAKE AFTER DIALYSIS) 12/20/23   Antonetta Rollene BRAVO, MD  nitroGLYCERIN  (NITRODUR - DOSED IN MG/24 HR) 0.2 mg/hr patch Place 1 patch (0.2 mg total) onto the skin daily. 12/28/23   Agarwala, Anshul, MD  ondansetron  (ZOFRAN ) 4 MG tablet Take 4 mg by mouth every 8 (eight) hours as needed for nausea or vomiting. 11/26/23   [provider]  pantoprazole  (PROTONIX ) 40 MG tablet Take 1 tablet (40 mg total) by mouth daily. 12/08/23   Kolby Schara L, MD  sertraline  (ZOLOFT ) 50 MG tablet Take 1.5 tablets (75 mg total) by mouth daily. Take one and a half tablets once daily 01/02/24   Holman Bonsignore L, MD  torsemide  (DEMADEX ) 100 MG tablet Take 1 tablet (100 mg total) by mouth every Tuesday, Thursday, Saturday, and Sunday. Non-dialysis days 01/02/24    Vicci Afton CROME, MD  [name]  500 MG chewable tablet Chew 1-2 tablets (500-1,000 mg total) by mouth See admin instructions. Take 1000mg  (2 tablets) by mouth with meals and 500mg  (1 tablet) with snacks 08/23/23   Antonetta Rollene BRAVO, MD  FLUoxetine (PROZAC) 10 MG capsule Take 10 mg by mouth daily.    05/28/11  [provider]  glipiZIDE  (GLUCOTROL ) 10 MG tablet Take 10 mg by mouth 2 (two) times daily before a meal.    05/28/11  [provider]    Physical Exam: Vitals:   01/17/24 1130 01/17/24 1145 01/17/24 1200 01/17/24 1215  BP: (!) 144/67 (!) 148/66 (!) 152/63 (!) 152/63  Pulse: 74 71 73 73  Resp: 19 18 17 13   Temp:      TempSrc:      SpO2: 93% 97% 93% 95%  Weight:      Height:       Constitutional: NAD, calm, comfortable, tired, appears chronically ill.  Eyes: PERRL, lids and conjunctivae normal ENMT: Mucous membranes are pale but moist. Posterior pharynx clear of any exudate or lesions.  Neck: normal, supple, no masses, no thyromegaly Respiratory: clear to auscultation bilaterally, no wheezing, no crackles. Normal respiratory effort. No accessory muscle use.  Cardiovascular: normal s1, s2 sounds, no murmurs / rubs / gallops. No extremity edema. 2+ pedal pulses. No carotid bruits.  Abdomen: no tenderness, no masses palpated. No hepatosplenomegaly. Bowel sounds positive.  Musculoskeletal: dry gangrene of the left hand/forearm.  no clubbing / cyanosis. No contractures. Normal muscle tone.  Skin: dry gangrene of left hand/forearm.  Neurologic: CN 2-12 grossly intact. Sensation intact, DTR normal. Strength 5/5 in all 4.  Psychiatric: Normal judgment and insight. Alert and oriented x 3. Normal mood.   Labs on Admission: I have personally reviewed following labs and imaging studies  CBC: Recent Labs  Lab 01/17/24 1129  WBC 7.7  NEUTROABS 6.2  HGB 7.0*  HCT 23.3*  MCV 87.3  PLT 325   Basic Metabolic Panel: Recent Labs  Lab 01/17/24 1129  NA 140  K 3.2*  CL  100  CO2 30  GLUCOSE 79  BUN 16  CREATININE 2.00*  CALCIUM  8.5*   GFR: Estimated Creatinine Clearance: 25.6 mL/min (A) (by C-G formula based on SCr of 2 mg/dL (H)). Liver Function Tests: Recent Labs  Lab 01/17/24 1129  AST 25  ALT 5  ALKPHOS 95  BILITOT 0.3  PROT 6.3*  ALBUMIN  3.5   No results for input(s): LIPASE, AMYLASE in the last 168 hours. No results for input(s): AMMONIA in the last 168 hours. Coagulation Profile: Recent Labs  Lab 01/17/24 1129  INR 1.0   Cardiac Enzymes: No results for input(s): CKTOTAL, CKMB, CKMBINDEX, TROPONINI in the last 168 hours. BNP (last 3 results) No results for input(s): PROBNP in the last 8760 hours. HbA1C: No results for input(s): HGBA1C in the last 72 hours. CBG: No results for input(s): GLUCAP in the last 168 hours. Lipid Profile: No results for input(s): CHOL, HDL, LDLCALC, TRIG, CHOLHDL, LDLDIRECT in the last 72 hours. Thyroid  Function Tests: No results for input(s): TSH, T4TOTAL, FREET4, T3FREE, THYROIDAB in the last 72 hours. Anemia Panel: No results for input(s): VITAMINB12, FOLATE, FERRITIN, TIBC, IRON, RETICCTPCT in the last 72 hours. Urine analysis:    Component Value Date/Time   COLORURINE AMBER (A) 11/10/2023 1101   APPEARANCEUR TURBID (A) 11/10/2023 1101   LABSPEC 1.017 11/10/2023 1101   PHURINE 5.0 11/10/2023 1101   GLUCOSEU NEGATIVE 11/10/2023 1101   HGBUR LARGE (A) 11/10/2023 1101   BILIRUBINUR NEGATIVE 11/10/2023 1101   BILIRUBINUR neg 02/15/2017 0913   KETONESUR NEGATIVE 11/10/2023 1101   PROTEINUR 100 (A) 11/10/2023 1101   UROBILINOGEN 0.2 02/15/2017 0913   NITRITE NEGATIVE 11/10/2023 1101   LEUKOCYTESUR MODERATE (A) 11/10/2023 1101    Radiological Exams on Admission: No results found.  EKG: Independently reviewed.   Assessment/Plan Principal Problem:   Anemia due to chronic blood loss Active Problems:   Hyperlipidemia   LUPUS  ERYTHEMATOSUS, DISCOID   OSA (obstructive sleep apnea)   HTN (hypertension)   Chronic pain syndrome   ESRD on hemodialysis (HCC)   PAD (peripheral artery disease)   PVD (peripheral vascular disease)   Generalized weakness   Hearing loss   GAD (generalized anxiety disorder)   Uncontrolled type 2 diabetes mellitus with hyperglycemia (HCC)   PAF (paroxysmal atrial fibrillation) (HCC)   Dry Gangrene of left upper extremity   S/P bilateral BKA (below knee amputation) (HCC)   Acquired thrombophilia    Chronic blood loss anemia -- Hg tested in our lab 7.0 which is not much change from recent discharge Hg of 7.4 -- she tested stool guaiac positive today -- holding anticoagulants for now and will monitor  -- recheck CBC in AM  -- appreciate GI consultation and recommendations  -- transfusion of 1 unit PRBC ordered by ED  Dry gangrene left hand/forearm -- she is being followed by hand surgeon and eventually will need amputation -- no s/s of infection  -- continue wrapping it   Paroxysmal atrial fibrillation  -- holding apixaban  until GI evaluation  -- unsure if she will be able to safely continue this   Uncontrolled type 2 DM with hyperglycemia and vascular complications -- given ESRD, will treat with SSI coverage (very sensitive)  PAD - severe -- resume home meds if able   ESRD on HD MWF -- pt completed  HD treatment today -- will consult to nephrology on 10/28   Essential hypertension -- resume home metoprolol  succinate  OSA -- will offer nightly CPAP therapy in hospital   DVT prophylaxis: holding apixaban  due to GI bleed, bilateral BKA so cannot use SCDs or TED hoses  Code Status: Full   Family Communication:   Disposition Plan: anticipate home   Consults called: GI   Admission status: OBV Time spent: 58 mins   Level of care: Telemetry Afton Louder MD [facility] How to contact the [facility] Attending or Consulting provider 7A - 7P or covering provider during  after hours 7P -7A, for this patient?  Check the care team in [facility] and look for a) attending/consulting [facility] provider listed and b) the [facility] team listed Log into [email or web address] and use [place]'s universal password to access. If you do not have the password, please contact the hospital operator. Locate the [facility] provider you are looking for under [facility] and page to a number that you can be directly reached. If you still have difficulty reaching the provider, please page the [facility] (Director on Call) for the Hospitalists listed on amion for assistance.   If 7PM-7AM, please contact night-coverage [email or web address] Password [email or web address]  01/17/2024, 1:57 PM

## 2024-01-17 NOTE — Hospital Course (Signed)
 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  She had a colonoscopy performed 01/01/2024 with Dr. Cindie with findings of diverticulosis, nonbleeding internal hemorrhoids and 4 ulcers in the descending colon and in the transverse colon that were clipped.  She was discharged home in stable condition with a hemoglobin of 7.4.  Hemoglobin has been monitored and dialysis.  She was sent to ER by EMS after dialysis today for hemoglobin of 6.4.  Recheck hemoglobin in the ED 7.0.  Patient is being typed and crossed and transfused 1 unit PRBC and admission requested for observation.  GI was consulted as well and recommended observation.

## 2024-01-17 NOTE — Consult Note (Signed)
 Gastroenterology Consult   Referring Provider: [facility] Primary Care Physician:  Antonetta Rollene BRAVO, MD Primary Gastroenterologist:  Dr. Cindie  Patient ID: Kathryn Beck; [id number]; 09-07-1955   Admit date: 01/17/2024  LOS: 0 days   Date of Consultation: 01/17/2024  Reason for Consultation:  Worsening anemia, heme positive stool   History of Present Illness   Kathryn Beck is a 68 y.o. year old female with past medical history significant for right renal lesion seen in CTA 11/2023, GERD, pancreatitis, moderate aortic stenosis (ECHO 05/2023) afib on apixaban  with cardioversion 10/27/2023, PAD s/p bilateral BKAs, HTN, HLD, Type 2 DM, bilateral adrenal adenoma (followed by Endocrinology), chronic HFpEF, OSA, ESRD (MWF), history of TIA's, history of recurrent chest pain  (cath in 12/2018 showing normal coronary arteries) MSSA infection of left hand s/p I&D in past, acute on chronic anemia with recurrent GI bleeding and requiring blood transfusions, last seen early October inpatient for recurrent GI bleeding and most recently as outpatient on 01/13/24. GI now consulted due to melena on exam, heme positive stool, worsening anemia.   Evaluation thus far for GI bleeding including EGD Sept 12, 2025 with normal esophagus, medium amount of food residue in stomach, normal duodenum, colonoscopy 12/05/2023 with suspected diverticular origin for bleeding vs hemorrhoidal and removal of polyps, early interval colonoscopy 12/17/23 for post-polypectomy bleed s/p clip placement and polyp removal, colonoscopy again 01/01/2024 during last hospitalization with non-bleeding internal hemorrhoids, pancolonic diverticulosis, evidence of blood in descending and transverse colon, 4 ulcers in colon s/p MR safe clips. She also had CTA while inpatient 12/16/23 that was negative for active bleeding when presenting for persistent rectal bleeding. She was seen as outpatient on 01/13/24 with recommendations to check iron  levels.    In the ED:  Hgb 7.0 today, was 7.2 about 5 days ago on outside labs. 1 unit PRBCs was ordered and currently infusing. Ferritin 1205, iron low at 30, sats 17. INR 1.0. Creatinine 2.00. She completed dialysis today. Potassium mildly low at 3.2. Melena noted on DRE in the ED.   She is drowsy but easily arousable. Difficult historian. Denies abdominal pain, N/V. Felt weak prior to admission during dialysis. Kathryn Beck Receiving blood currently. Feeling better. No chest pain or shortness of breath. Denies NSAIDs. She denies any overt Gi bleeding that she is aware.     EGD 12/03/23 - Normal esophagus - Medium amount of food residue in stomach - Normal duodenal bulb, first portion of duodenum and second portion of duodenum  Colonoscopy 12/05/2023: - Nonbleeding internal hemorrhoids -Diverticulosis in the sigmoid colon, hepatic flexure, ascending colon -Single 10 mm polyp in the ascending colon removed, tubular adenoma -Single 5 mm polyp in the transverse colon removed, tubular adenoma -Likely bled from right sided diverticular disease versus benign hemorrhoidal. -If evidence of further bleeding, would recommend CT angio and consider IR consultation - Next colonoscopy in 3 years   Colonoscopy 12/17/23: -post polypectomy bleeding (ascending polypectomy site) closed with hemostasis clip X 1 -melanosis coli -six 4-81mm polyps in rectum, sigmoid colon, desc colon, hepatic flexure removed with hot snare -diverticulosis -non-bleeding ext/int hemorrhoids -multiple tubular adenomas -next colonoscopy 3 years.    Colonoscopy 01/01/2024 with fresh blood in the transverse and descending colons.  After careful lavage, 4 postpolypectomy ulcers found.  All 4 sites were clipped.    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  Last Assessment & Plan:   Multiple polyps with surveillance due in 2021.  IMO SNOMED Dx Update Oct 2024     History of pneumonia 2019   Hypoxia 04/03/2019   Memory loss    Mixed hyperlipidemia    Not currently working due to disabled status 06/13/2018   Formatting of this note might be different from the original.  Last Assessment & Plan:   Formatting of this note might be different from the original.  Reports being  disabled since April/May 2019 , hospitalized then in a niursing home, now on dialysis and is unable to work     Obesity    Pancreatitis    Peritoneal dialysis catheter in place    Pneumonia due to COVID-19 virus 04/02/2019   Sleep apnea    Noncompliant with CPAP   Stroke (HCC)    mini stroke   Type 2 diabetes mellitus (HCC)     Past Surgical History:  Procedure Laterality Date   ABDOMINAL AORTOGRAM W/LOWER EXTREMITY N/A 04/30/2022   Procedure: ABDOMINAL AORTOGRAM W/LOWER EXTREMITY;  Surgeon: Magda Debby SAILOR, MD;  Location: [facility];  Service: Cardiovascular;  Laterality: N/A;   ABDOMINAL AORTOGRAM W/LOWER EXTREMITY N/A 07/21/2022   Procedure: ABDOMINAL AORTOGRAM W/LOWER EXTREMITY;  Surgeon: Serene Gaile ORN, MD;  Location: [facility];  Service: Cardiovascular;  Laterality: N/A;   ABDOMINAL HYSTERECTOMY     ACHILLES TENDON LENGTHENING  08/15/2022    Procedure: ACHILLES TENDON LENGTHENING;  Surgeon: Silva Juliene SAUNDERS, DPM;  Location: [facility] OR;  Service: Podiatry;;   AMPUTATION Right 05/29/2022   Procedure: RIGHT BELOW THE KNEE AMPUTATION;  Surgeon: Harden Jerona GAILS, MD;  Location: [facility] OR;  Service: Orthopedics;  Laterality: Right;   AMPUTATION Left 09/04/2022   Procedure: AMPUTATION FOOT, serial irrigation;  Surgeon: Joya Stabs, DPM;  Location: [facility] OR;  Service: Podiatry;  Laterality: Left;  Surgical team to do block   AMPUTATION Left 10/07/2022   Procedure: LEFT BELOW KNEE AMPUTATION;  Surgeon: Harden Jerona GAILS, MD;  Location: [facility] OR;  Service: Orthopedics;  Laterality: Left;   AMPUTATION FINGER Left 09/29/2023   Procedure: AMPUTATION, FINGER;  Surgeon: Harden Jerona GAILS, MD;  Location: [facility] OR;  Service: Orthopedics;  Laterality: Left;  LEFT HAND LONG FINGER RAY AMPUTATION   AV FISTULA PLACEMENT Left 09/02/2017   Procedure: creation of left arm ARTERIOVENOUS (AV) FISTULA;  Surgeon: Serene Gaile ORN, MD;  Location: [facility] OR;  Service: Vascular;  Laterality: Left;   COLONOSCOPY  2008   Dr. Harvey: normal    COLONOSCOPY N/A 12/18/2016   Dr. Harvey: multiple tubular adenomas, internal hemorrhoids. Surveillance in 3 years  COLONOSCOPY N/A 12/05/2023   Procedure: COLONOSCOPY;  Surgeon: Cindie Carlin POUR, DO;  Location: [facility];  Service: Endoscopy;  Laterality: N/A;   COLONOSCOPY N/A 12/17/2023   Procedure: COLONOSCOPY;  Surgeon: Shaaron Lamar HERO, MD;  Location: [facility];  Service: Endoscopy;  Laterality: N/A;   COLONOSCOPY N/A 01/01/2024   Procedure: COLONOSCOPY;  Surgeon: Cindie Carlin POUR, DO;  [facility]: [facility];  Service: Endoscopy;  Laterality: N/A;   ESOPHAGEAL DILATION N/A 10/13/2015   Procedure: ESOPHAGEAL DILATION;  Surgeon: Claudis RAYMOND Rivet, MD;  Location: [facility];  Service: Endoscopy;  Laterality: N/A;   ESOPHAGEAL DILATION N/A 12/03/2023   Procedure: DILATION, ESOPHAGUS;  Surgeon: Cindie Carlin POUR, DO;  [facility]: [facility];   Service: Endoscopy;  Laterality: N/A;   ESOPHAGOGASTRODUODENOSCOPY N/A 10/13/2015   Dr. Rivet: chronic gastritis on path, no H.pylori. Empiric dilation    ESOPHAGOGASTRODUODENOSCOPY N/A 12/18/2016   Dr. Harvey: mild gastritis. BRAVO study revealed uncontrolled GERD. Dysphagia secondary to uncontrolled reflux   ESOPHAGOGASTRODUODENOSCOPY N/A 12/03/2023   Procedure: EGD (ESOPHAGOGASTRODUODENOSCOPY);  Surgeon: Cindie Carlin POUR, DO;  [facility]: [facility];  Service: Endoscopy;  Laterality: N/A;   FOOT SURGERY Bilateral    nerve     INCISION AND DRAINAGE OF WOUND Left 10/30/2023   Procedure: IRRIGATION AND DEBRIDEMENT WOUND;  Surgeon: Arlinda Buster, MD;  Location: [facility] OR;  Service: Orthopedics;  Laterality: Left;  LEFT HAND WOUND   LEFT HEART CATH AND CORONARY ANGIOGRAPHY N/A 12/29/2018   Procedure: LEFT HEART CATH AND CORONARY ANGIOGRAPHY;  Surgeon: Dann Candyce RAMAN, MD;  Location: [facility] INVASIVE CV LAB;  Service: Cardiovascular;  Laterality: N/A;   LOWER EXTREMITY ANGIOGRAPHY Right 05/04/2022   Procedure: Lower Extremity Angiography;  Surgeon: Lanis Fonda BRAVO, MD;  Location: [facility] INVASIVE CV LAB;  Service: Cardiovascular;  Laterality: Right;   LUNG BIOPSY     MASS EXCISION Right 01/09/2013   Procedure: EXCISION OF NEOPLASM OF RIGHT  AXILLA  AND EXCISION OF NEOPLASM OF LEFT AXILLA;  Surgeon: Oneil DELENA Budge, MD;  Location: [facility]S;  Service: General;  Laterality: Right;  procedure end @ 08:23   MYRINGOTOMY WITH TUBE PLACEMENT Bilateral 04/28/2017   Procedure: BILATERAL MYRINGOTOMY WITH TUBE PLACEMENT;  Surgeon: Karis Clunes, MD;  Location: [facility] OR;  Service: ENT;  Laterality: Bilateral;   PERIPHERAL VASCULAR BALLOON ANGIOPLASTY Right 05/04/2022   Procedure: PERIPHERAL VASCULAR BALLOON ANGIOPLASTY;  Surgeon: Lanis Fonda BRAVO, MD;  Location: [facility] INVASIVE CV LAB;  Service: Cardiovascular;  Laterality: Right;  PT   PERIPHERAL VASCULAR INTERVENTION Right 05/04/2022   Procedure: PERIPHERAL VASCULAR INTERVENTION;   Surgeon: Lanis Fonda BRAVO, MD;  Location: [facility] INVASIVE CV LAB;  Service: Cardiovascular;  Laterality: Right;  SFA   PERIPHERAL VASCULAR INTERVENTION Left 07/21/2022   Procedure: PERIPHERAL VASCULAR INTERVENTION;  Surgeon: Serene Gaile ORN, MD;  Location: [facility];  Service: Cardiovascular;  Laterality: Left;   REVISION OF ARTERIOVENOUS GORETEX GRAFT Left 05/04/2018   Procedure: TRANSPOSITION OF CEPHALIC VEIN ARTERIOVENOUS FISTULA LEFT ARM;  Surgeon: Oris Krystal FALCON, MD;  Location: [facility] OR;  Service: Vascular;  Laterality: Left;   SAVORY DILATION N/A 12/18/2016   Procedure: SAVORY DILATION;  Surgeon: Harvey Margo CROME, MD;  [facility];  Service: Endoscopy;  Laterality: N/A;   TRANSMETATARSAL AMPUTATION Left 08/15/2022   Procedure: TRANSMETATARSAL AMPUTATION;  Surgeon: Silva Juliene SAUNDERS, DPM;  Location: [facility] OR;  Service: Podiatry;  Laterality: Left;    Prior to Admission medications   Medication Sig Start Date End  Date Taking? Authorizing Provider  acetaminophen  (TYLENOL ) 500 MG tablet Take 1,000 mg by mouth every 6 (six) hours as needed for mild pain (pain score 1-3).    [provider]  amLODipine  (NORVASC ) 10 MG tablet Take 0.5 tablets (5 mg total) by mouth daily. 10/28/23   Pearlean Manus, MD  apixaban  (ELIQUIS ) 2.5 MG TABS tablet Take 1 tablet (2.5 mg total) by mouth 2 (two) times daily. 01/04/24   Johnson, Clanford L, MD  B Complex-C-Folic Acid  (RENA-VITE RX) 1 MG TABS Take 1 tablet by mouth daily. 04/15/22   [provider]  busPIRone  (BUSPAR ) 7.5 MG tablet Take 1 tablet (7.5 mg total) by mouth 2 (two) times daily. 11/24/23   Antonetta Rollene BRAVO, MD  calcitRIOL  (ROCALTROL ) 0.5 MCG capsule Take 2 capsules (1 mcg total) by mouth every Monday, Wednesday, and Friday with hemodialysis. 11/05/23   Caleen Burgess BROCKS, MD  cinacalcet  (SENSIPAR ) 30 MG tablet Take 3 tablets (90 mg total) by mouth every Monday, Wednesday, and Friday. 12/08/23   Johnson, Clanford L, MD  ezetimibe   (ZETIA ) 10 MG tablet Take 1 tablet (10 mg total) by mouth daily. 04/27/23   Antonetta Rollene BRAVO, MD  insulin  aspart (NOVOLOG ) 100 UNIT/ML injection Inject 0-6 Units into the skin 3 (three) times daily with meals. CBG 70 - 120: 0 units  CBG 121 - 150: 0 units  CBG 151 - 200: 1 unit  CBG 201-250: 2 units  CBG 251-300: 3 units  CBG 301-350: 4 units  CBG 351-400: 5 units  CBG > 400: Give 10 units and call MD 11/12/23   Shahmehdi, Seyed A, MD  insulin  glargine, 2 Unit Dial , (TOUJEO  MAX SOLOSTAR) 300 UNIT/ML Solostar Pen Inject 30 Units into the skin daily. May need to slowly increase the dose depending upon your blood sugar, follow-up with PCP 01/01/23   Trixie File, MD  metoprolol  succinate (TOPROL -XL) 50 MG 24 hr tablet TAKE ONE TABLET BY MOUTH ONCE DAILY WITH FOOD (TAKE AFTER DIALYSIS) 12/20/23   Antonetta Rollene BRAVO, MD  nitroGLYCERIN  (NITRODUR - DOSED IN MG/24 HR) 0.2 mg/hr patch Place 1 patch (0.2 mg total) onto the skin daily. 12/28/23   Agarwala, Anshul, MD  ondansetron  (ZOFRAN ) 4 MG tablet Take 4 mg by mouth every 8 (eight) hours as needed for nausea or vomiting. 11/26/23   [provider]  pantoprazole  (PROTONIX ) 40 MG tablet Take 1 tablet (40 mg total) by mouth daily. 12/08/23   Johnson, Clanford L, MD  sertraline  (ZOLOFT ) 50 MG tablet Take 1.5 tablets (75 mg total) by mouth daily. Take one and a half tablets once daily 01/02/24   Johnson, Clanford L, MD  torsemide  (DEMADEX ) 100 MG tablet Take 1 tablet (100 mg total) by mouth every Tuesday, Thursday, Saturday, and Sunday. Non-dialysis days 01/02/24   Vicci Afton CROME, MD  [name]  500 MG chewable tablet Chew 1-2 tablets (500-1,000 mg total) by mouth See admin instructions. Take 1000mg  (2 tablets) by mouth with meals and 500mg  (1 tablet) with snacks 08/23/23   Antonetta Rollene BRAVO, MD  FLUoxetine (PROZAC) 10 MG capsule Take 10 mg by mouth daily.    05/28/11  [provider]  glipiZIDE  (GLUCOTROL ) 10 MG tablet Take 10 mg by mouth 2  (two) times daily before a meal.    05/28/11  [provider]    Current Facility-Administered Medications  Medication Dose Route Frequency Provider Last Rate Last Admin   acetaminophen  (TYLENOL ) tablet 650 mg  650 mg Oral Q6H PRN Johnson, Clanford L,  MD       Or   acetaminophen  (TYLENOL ) suppository 650 mg  650 mg Rectal Q6H PRN Johnson, Clanford L, MD       bisacodyl  (DULCOLAX) EC tablet 5 mg  5 mg Oral Daily PRN Johnson, Clanford L, MD       fentaNYL  (SUBLIMAZE ) injection 12.5 mcg  12.5 mcg Intravenous Q2H PRN Johnson, Clanford L, MD       hydrALAZINE  (APRESOLINE ) injection 10 mg  10 mg Intravenous Q4H PRN Johnson, Clanford L, MD       insulin  aspart (novoLOG ) injection 0-6 Units  0-6 Units Subcutaneous TID WC Johnson, Clanford L, MD       insulin  aspart (novoLOG ) injection 2 Units  2 Units Subcutaneous TID WC Johnson, Clanford L, MD       oxyCODONE  (Oxy IR/ROXICODONE ) immediate release tablet 5 mg  5 mg Oral TID PRN Johnson, Clanford L, MD       pantoprazole  (PROTONIX ) EC tablet 40 mg  40 mg Oral QPM Johnson, Clanford L, MD       Current Outpatient Medications  Medication Sig Dispense Refill   acetaminophen  (TYLENOL ) 500 MG tablet Take 1,000 mg by mouth every 6 (six) hours as needed for mild pain (pain score 1-3).     amLODipine  (NORVASC ) 10 MG tablet Take 0.5 tablets (5 mg total) by mouth daily. 45 tablet 2   apixaban  (ELIQUIS ) 2.5 MG TABS tablet Take 1 tablet (2.5 mg total) by mouth 2 (two) times daily.     B Complex-C-Folic Acid  (RENA-VITE RX) 1 MG TABS Take 1 tablet by mouth daily.     busPIRone  (BUSPAR ) 7.5 MG tablet Take 1 tablet (7.5 mg total) by mouth 2 (two) times daily. 60 tablet 3   calcitRIOL  (ROCALTROL ) 0.5 MCG capsule Take 2 capsules (1 mcg total) by mouth every Monday, Wednesday, and Friday with hemodialysis.     cinacalcet  (SENSIPAR ) 30 MG tablet Take 3 tablets (90 mg total) by mouth every Monday, Wednesday, and Friday.     ezetimibe  (ZETIA ) 10 MG tablet Take 1  tablet (10 mg total) by mouth daily. 28 tablet 11   insulin  aspart (NOVOLOG ) 100 UNIT/ML injection Inject 0-6 Units into the skin 3 (three) times daily with meals. CBG 70 - 120: 0 units  CBG 121 - 150: 0 units  CBG 151 - 200: 1 unit  CBG 201-250: 2 units  CBG 251-300: 3 units  CBG 301-350: 4 units  CBG 351-400: 5 units  CBG > 400: Give 10 units and call MD 10 mL 11   insulin  glargine, 2 Unit Dial , (TOUJEO  MAX SOLOSTAR) 300 UNIT/ML Solostar Pen Inject 30 Units into the skin daily. May need to slowly increase the dose depending upon your blood sugar, follow-up with PCP 24 mL 1   metoprolol  succinate (TOPROL -XL) 50 MG 24 hr tablet TAKE ONE TABLET BY MOUTH ONCE DAILY WITH FOOD (TAKE AFTER DIALYSIS) 28 tablet 11   nitroGLYCERIN  (NITRODUR - DOSED IN MG/24 HR) 0.2 mg/hr patch Place 1 patch (0.2 mg total) onto the skin daily. 30 patch 12   ondansetron  (ZOFRAN ) 4 MG tablet Take 4 mg by mouth every 8 (eight) hours as needed for nausea or vomiting.     pantoprazole  (PROTONIX ) 40 MG tablet Take 1 tablet (40 mg total) by mouth daily. 30 tablet 1   sertraline  (ZOLOFT ) 50 MG tablet Take 1.5 tablets (75 mg total) by mouth daily. Take one and a half tablets once daily     torsemide  (DEMADEX )  100 MG tablet Take 1 tablet (100 mg total) by mouth every Tuesday, Thursday, Saturday, and Sunday. Non-dialysis days     [name]  500 MG chewable tablet Chew 1-2 tablets (500-1,000 mg total) by mouth See admin instructions. Take 1000mg  (2 tablets) by mouth with meals and 500mg  (1 tablet) with snacks 90 tablet 0   Facility-Administered Medications Ordered in Other Encounters  Medication Dose Route Frequency Provider Last Rate Last Admin   0.9 %  sodium chloride  infusion   Intravenous Continuous Lockamy, Randi L, NP-C 20 mL/hr at 01/01/24 0904 Continued from Pre-op  at 01/01/24 0904    Allergies as of 01/17/2024 - Review Complete 01/17/2024  Allergen Reaction Noted   Ace inhibitors Anaphylaxis and Swelling 06/07/2007    Penicillins Itching and Swelling 06/07/2007   Statins Other (See Comments) 12/24/2012   Albuterol  Swelling 08/31/2017    Family History  Problem Relation Age of Onset   Hypertension Father    Hypercholesterolemia Father    Arthritis Father    Hypertension Sister    Hypercholesterolemia Sister    Breast cancer Sister    Hypertension Sister    Colon cancer Neg Hx    Colon polyps Neg Hx     Social History   Socioeconomic History   Marital status: Married    Spouse name: Bleu Moisan   Number of children: 2   Years of education: 12   Highest education level: Some college, no degree  Occupational History   Occupation: retired   Tobacco Use   Smoking status: Never    Passive exposure: Never   Smokeless tobacco: Never   Tobacco comments:    Verified by Daughter, [name]  Vaping Use   Vaping status: Never Used  Substance and Sexual Activity   Alcohol use: No   Drug use: No   Sexual activity: Not Currently    Partners: Male  Other Topics Concern   Not on file  Social History Narrative   Lives alone with husband    Right handed   Caffeine-1/2 daily   Social Drivers of Health   Financial Resource Strain: Low Risk  (11/24/2023)   Overall Financial Resource Strain (CARDIA)    Difficulty of Paying Living Expenses: Not hard at all  Food Insecurity: No Food Insecurity (12/30/2023)   Hunger Vital Sign    Worried About Running Out of Food in the Last Year: Never true    Ran Out of Food in the Last Year: Never true  Transportation Needs: No Transportation Needs (12/30/2023)   PRAPARE - Administrator, Civil Service (Medical): No    Lack of Transportation (Non-Medical): No  Physical Activity: Inactive (11/24/2023)   Exercise Vital Sign    Days of Exercise per Week: 0 days    Minutes of Exercise per Session: Not on file  Stress: Stress Concern Present (11/24/2023)   [employer] of Occupational Health - Occupational Stress Questionnaire    Feeling of  Stress: To some extent  Social Connections: Moderately Integrated (12/30/2023)   Social Connection and Isolation Panel    Frequency of Communication with Friends and Family: More than three times a week    Frequency of Social Gatherings with Friends and Family: More than three times a week    Attends Religious Services: 1 to 4 times per year    Active Member of [employer] or Organizations: No    Attends Banker Meetings: Never    Marital Status: Married  Recent Concern: Social Connections - Moderately Isolated (  11/25/2023)   Social Connection and Isolation Panel    Frequency of Communication with Friends and Family: Three times a week    Frequency of Social Gatherings with Friends and Family: Three times a week    Attends Religious Services: Patient declined    Active Member of Clubs or Organizations: No    Attends Banker Meetings: Never    Marital Status: Married  Catering Manager Violence: Not At Risk (12/30/2023)   Humiliation, Afraid, Rape, and Kick questionnaire    Fear of Current or Ex-Partner: No    Emotionally Abused: No    Physically Abused: No    Sexually Abused: No     Review of Systems   Gen: see HPI CV: Denies chest pain, heart palpitations, syncope, edema  Resp: Denies shortness of breath with rest, cough, wheezing, coughing up blood, and pleurisy. GI: see HPI GU : dialysis MS: bilateral BKA Derm: Denies rash, itching, dry skin, hives. Psych: Denies depression, anxiety, memory loss, hallucinations, and confusion. Heme: see HPI Neuro:  Denies any headaches, dizziness, paresthesias, shaking  Physical Exam   Vital Signs in last 24 hours: Temp:  [98 F (36.7 C)-98.3 F (36.8 C)] 98 F (36.7 C) (10/27 1411) Pulse Rate:  [67-74] 67 (10/27 1400) Resp:  [13-19] 17 (10/27 1400) BP: (144-155)/(58-67) 155/58 (10/27 1411) SpO2:  [93 %-97 %] 96 % (10/27 1400) Weight:  [65.3 kg] 65.3 kg (10/27 1111)    General:   drowsy, easily awakens to verbal  stimuli, oriented X 4 Head:  Normocephalic and atraumatic. Eyes:  Sclera clear, no icterus.   Conjunctiva pink. Lungs:  Clear throughout to auscultation.    Heart:  S1 S2 with systolic murmur Abdomen:  Soft, nontender and nondistended. Normal bowel sounds, without guarding, and without rebound.   Rectal: deferred   Msk:  bilateral BKA Psych:  flat affect  Intake/Output from previous day: No intake/output data recorded. Intake/Output this shift: No intake/output data recorded.    Labs/Studies   Recent Labs Recent Labs    01/17/24 1129  WBC 7.7  HGB 7.0*  HCT 23.3*  PLT 325   BMET Recent Labs    01/17/24 1129  NA 140  K 3.2*  CL 100  CO2 30  GLUCOSE 79  BUN 16  CREATININE 2.00*  CALCIUM  8.5*   LFT Recent Labs    01/17/24 1129  PROT 6.3*  ALBUMIN  3.5  AST 25  ALT 5  ALKPHOS 95  BILITOT 0.3   PT/INR Recent Labs    01/17/24 1129  LABPROT 14.1  INR 1.0      Assessment   Kathryn Beck is a 68 y.o. year old female  with past medical history significant for right renal lesion seen in CTA 11/2023, GERD, pancreatitis, moderate aortic stenosis (ECHO 05/2023) afib on apixaban  with cardioversion 10/27/2023, PAD s/p bilateral BKAs, HTN, HLD, Type 2 DM, bilateral adrenal adenoma (followed by Endocrinology), chronic HFpEF, OSA, ESRD (MWF), history of TIA's, history of recurrent chest pain  (cath in 12/2018 showing normal coronary arteries) MSSA infection of left hand s/p I&D in past, acute on chronic anemia with recurrent GI bleeding and requiring blood transfusions, presenting again to the ED with symptomatic anemia and GI now consulted due to melena on exam, heme positive stool, and anemia.  Acute blood loss anemia: Hgb 7.0 on admission, not much changed from 5 days ago on outside labs (was 7.2); however, she is symptomatic and melena on exam in ED via DRE, heme positive.  Evaluation thus far as noted above with EGD, multiple colonoscopies. Prior stuttering GI bleeding  on Eliquis  felt secondary to possible right-sided diverticular bleed vs hemorrhoids, then subsequently had post-polypectomy bleed. EGD without complete visualization of stomach due to retained food in Sept 2025. She has risk factors for small bowel AVMs and would benefit from capsule study as outpatient. If she has further symptomatic anemia while admitted, recommend repeat EGD with Reglan  prior to ensure appropriate visualization. Capsule could be done as outpatient. Unable to exclude occult Dieulafoy, AVMs, right-sided stuttering diverticular bleed,  etc on anticoagulation.   We will also check iron studies. I note she does have evidence for IDA this year and earlier last year. Ongoing anemia multifactorial in setting of chronic disease on dialysis, IDA, GI losses.     Plan / Recommendations    Agree with transfusion Follow H/H Can plan on outpatient capsule but would recommend modified prep prior and Reglan  dosing beforehand due to concern for delayed gastric emptying. May need EGD this admission if persistent melena or transfusion dependent anemia while inpatient Hold Eliquis  for now PPI daily     01/17/2024, 2:19 PM  Therisa MICAEL Stager, PhD, [facility]

## 2024-01-17 NOTE — ED Provider Notes (Signed)
 [place] EMERGENCY DEPARTMENT AT [facility] Provider Note   CSN: [id number] Arrival date & time: 01/17/24  1056     Patient presents with: Weakness   Kathryn Beck is a 68 y.o. female.   Patient is a 68 year old female who presents to the emergency department the chief complaint of anemia and generalized weakness.  Patient notes that she did get dialysis today but notes that she was anemic and they sent her to the emergency department for further evaluation.  She does have a history of previous GI bleeds and is currently followed by gastroenterology.  She denies any active rectal bleeding that she has noticed.  She denies any abdominal pain, nausea, vomiting.  She has had no dysuria or hematuria.  She denies any chest pain or shortness of breath.  There is been no associated syncope.   Weakness      Prior to Admission medications   Medication Sig Start Date End Date Taking? Authorizing Provider  acetaminophen  (TYLENOL ) 500 MG tablet Take 1,000 mg by mouth every 6 (six) hours as needed for mild pain (pain score 1-3).    [provider]  amLODipine  (NORVASC ) 10 MG tablet Take 0.5 tablets (5 mg total) by mouth daily. 10/28/23   Pearlean Manus, MD  apixaban  (ELIQUIS ) 2.5 MG TABS tablet Take 1 tablet (2.5 mg total) by mouth 2 (two) times daily. 01/04/24   Johnson, Clanford L, MD  B Complex-C-Folic Acid  (RENA-VITE RX) 1 MG TABS Take 1 tablet by mouth daily. 04/15/22   [provider]  busPIRone  (BUSPAR ) 7.5 MG tablet Take 1 tablet (7.5 mg total) by mouth 2 (two) times daily. 11/24/23   Antonetta Rollene BRAVO, MD  calcitRIOL  (ROCALTROL ) 0.5 MCG capsule Take 2 capsules (1 mcg total) by mouth every Monday, Wednesday, and Friday with hemodialysis. 11/05/23   Caleen Burgess BROCKS, MD  cinacalcet  (SENSIPAR ) 30 MG tablet Take 3 tablets (90 mg total) by mouth every Monday, Wednesday, and Friday. 12/08/23   Johnson, Clanford L, MD  ezetimibe  (ZETIA ) 10 MG tablet Take 1 tablet (10  mg total) by mouth daily. 04/27/23   Antonetta Rollene BRAVO, MD  insulin  aspart (NOVOLOG ) 100 UNIT/ML injection Inject 0-6 Units into the skin 3 (three) times daily with meals. CBG 70 - 120: 0 units  CBG 121 - 150: 0 units  CBG 151 - 200: 1 unit  CBG 201-250: 2 units  CBG 251-300: 3 units  CBG 301-350: 4 units  CBG 351-400: 5 units  CBG > 400: Give 10 units and call MD 11/12/23   Willette Adriana LABOR, MD  insulin  glargine, 2 Unit Dial , (TOUJEO  MAX SOLOSTAR) 300 UNIT/ML Solostar Pen Inject 30 Units into the skin daily. May need to slowly increase the dose depending upon your blood sugar, follow-up with PCP 01/01/23   Trixie File, MD  metoprolol  succinate (TOPROL -XL) 50 MG 24 hr tablet TAKE ONE TABLET BY MOUTH ONCE DAILY WITH FOOD (TAKE AFTER DIALYSIS) 12/20/23   Antonetta Rollene BRAVO, MD  nitroGLYCERIN  (NITRODUR - DOSED IN MG/24 HR) 0.2 mg/hr patch Place 1 patch (0.2 mg total) onto the skin daily. 12/28/23   Agarwala, Anshul, MD  ondansetron  (ZOFRAN ) 4 MG tablet Take 4 mg by mouth every 8 (eight) hours as needed for nausea or vomiting. 11/26/23   [provider]  pantoprazole  (PROTONIX ) 40 MG tablet Take 1 tablet (40 mg total) by mouth daily. 12/08/23   Johnson, Clanford L, MD  sertraline  (ZOLOFT ) 50 MG tablet Take 1.5 tablets (75 mg  total) by mouth daily. Take one and a half tablets once daily 01/02/24   Johnson, Clanford L, MD  torsemide  (DEMADEX ) 100 MG tablet Take 1 tablet (100 mg total) by mouth every Tuesday, Thursday, Saturday, and Sunday. Non-dialysis days 01/02/24   Vicci Afton CROME, MD  [name]  500 MG chewable tablet Chew 1-2 tablets (500-1,000 mg total) by mouth See admin instructions. Take 1000mg  (2 tablets) by mouth with meals and 500mg  (1 tablet) with snacks 08/23/23   Antonetta Rollene BRAVO, MD  FLUoxetine (PROZAC) 10 MG capsule Take 10 mg by mouth daily.    05/28/11  [provider]  glipiZIDE  (GLUCOTROL ) 10 MG tablet Take 10 mg by mouth 2 (two) times daily before a meal.     05/28/11  [provider]    Allergies: Ace inhibitors, Penicillins, Statins, and Albuterol     Review of Systems  Neurological:  Positive for weakness.  All other systems reviewed and are negative.   Updated Vital Signs BP (!) 152/63   Pulse 73   Temp 98.3 F (36.8 C) (Oral)   Resp 13   Ht 5' 6 (1.676 m)   Wt 65.3 kg   SpO2 95%   BMI 23.24 kg/m   Physical Exam Vitals and nursing note reviewed. Exam conducted with a chaperone present.  Constitutional:      General: She is not in acute distress.    Appearance: Normal appearance. She is not ill-appearing.  HENT:     Head: Normocephalic and atraumatic.     Nose: Nose normal.     Mouth/Throat:     Mouth: Mucous membranes are moist.  Eyes:     Extraocular Movements: Extraocular movements intact.     Conjunctiva/sclera: Conjunctivae normal.     Pupils: Pupils are equal, round, and reactive to light.  Cardiovascular:     Rate and Rhythm: Normal rate and regular rhythm.     Pulses: Normal pulses.     Heart sounds: Normal heart sounds. No murmur heard.    No gallop.  Pulmonary:     Effort: Pulmonary effort is normal. No respiratory distress.     Breath sounds: Normal breath sounds. No stridor. No wheezing, rhonchi or rales.  Abdominal:     General: Abdomen is flat. Bowel sounds are normal. There is no distension.     Palpations: Abdomen is soft.     Tenderness: There is no abdominal tenderness. There is no guarding.  Genitourinary:    Rectum: Guaiac result positive.     Comments: Melena noted Musculoskeletal:        General: Normal range of motion.     Cervical back: Normal range of motion and neck supple.  Skin:    General: Skin is warm and dry.  Neurological:     General: No focal deficit present.     Mental Status: She is alert and oriented to person, place, and time. Mental status is at baseline.  Psychiatric:        Mood and Affect: Mood normal.        Behavior: Behavior normal.        Thought Content:  Thought content normal.        Judgment: Judgment normal.     (all labs ordered are listed, but only abnormal results are displayed) Labs Reviewed  COMPREHENSIVE METABOLIC PANEL WITH GFR - Abnormal; Notable for the following components:      Result Value   Potassium 3.2 (*)    Creatinine, Ser 2.00 (*)  Calcium  8.5 (*)    Total Protein 6.3 (*)    GFR, Estimated 27 (*)    All other components within normal limits  CBC WITH DIFFERENTIAL/PLATELET - Abnormal; Notable for the following components:   RBC 2.67 (*)    Hemoglobin 7.0 (*)    HCT 23.3 (*)    RDW 17.0 (*)    nRBC 1.6 (*)    All other components within normal limits  POC OCCULT BLOOD, ED - Abnormal; Notable for the following components:   Fecal Occult Blood Positive (*)    All other components within normal limits  PROTIME-INR  URINALYSIS, ROUTINE W REFLEX MICROSCOPIC  TYPE AND SCREEN  PREPARE RBC (CROSSMATCH)    EKG: None  Radiology: No results found.   Procedures   Medications Ordered in the ED  0.9 %  sodium chloride  infusion (Manually program via Guardrails IV Fluids) (has no administration in time range)  pantoprazole  (PROTONIX ) injection 40 mg (40 mg Intravenous Given 01/17/24 1128)  potassium chloride  SA (KLOR-CON  M) CR tablet 40 mEq (40 mEq Oral Given 01/17/24 1247)                                    Medical Decision Making Amount and/or Complexity of Data Reviewed Labs: ordered.  Risk Prescription drug management. Decision regarding hospitalization.   This patient presents to the ED for concern of weakness, anemia, this involves an extensive number of treatment options, and is a complaint that carries with it a high risk of complications and morbidity.  The differential diagnosis includes GI bleed, anemia of chronic disease, electrolyte derangement, acute on chronic kidney disease   Co morbidities that complicate the patient evaluation  CHF, CKD, dialysis   Additional history  obtained:  Additional history obtained from medical records External records from outside source obtained and reviewed including medical records   Lab Tests:  I Ordered, and personally interpreted labs.  The pertinent results include: No leukocytosis, anemia noted, stable creatinine, mild hypokalemia, normal electrolytes, Hemoccult positive stool    Consultations Obtained:  I requested consultation with the gastroenterology, Dr. Cindie,  and discussed lab and imaging findings as well as pertinent plan - they recommend: Admission will send consult   Problem List / ED Course / Critical interventions / Medication management  Patient is doing well at this time and does remain stable.  She continues to have stable vital signs with no tachycardia or hypotension.  Blood work does demonstrate anemia and a unit of blood has been ordered.  Did discuss patient case with gastroenterology who was in agreement for admission and monitoring.  Patient was given dose of Protonix  as well given her Hemoccult positive status as well as the melena noted on exam.  Have discussed patient case with Dr. Vicci with the hospital service who has excepted for admission. I ordered medication including potassium, Protonix , packed red blood cells for anemia, hypokalemia Reevaluation of the patient after these medicines showed that the patient improved I have reviewed the patients home medicines and have made adjustments as needed   Social Determinants of Health:  None   Test / Admission - Considered:  Admission     Final diagnoses:  Anemia, unspecified type  Gastrointestinal hemorrhage, unspecified gastrointestinal hemorrhage type  Melena  Weakness    ED Discharge Orders     None          Daralene Lonni BIRCH, PA-C 01/17/24  1330    Bernard Drivers, MD 01/19/24 1450

## 2024-01-17 NOTE — ED Triage Notes (Signed)
 Pt to er via ems, per ems pt is here for a heme of 6.4, states that she finished her dialysis today and then was sent over to the er for a transfusion.  States that this is the third time.

## 2024-01-18 ENCOUNTER — Telehealth: Payer: Self-pay | Admitting: Gastroenterology

## 2024-01-18 ENCOUNTER — Inpatient Hospital Stay

## 2024-01-18 DIAGNOSIS — D500 Iron deficiency anemia secondary to blood loss (chronic): Secondary | ICD-10-CM

## 2024-01-18 DIAGNOSIS — D6869 Other thrombophilia: Secondary | ICD-10-CM | POA: Diagnosis not present

## 2024-01-18 DIAGNOSIS — I480 Paroxysmal atrial fibrillation: Secondary | ICD-10-CM

## 2024-01-18 DIAGNOSIS — I10 Essential (primary) hypertension: Secondary | ICD-10-CM | POA: Diagnosis not present

## 2024-01-18 DIAGNOSIS — R197 Diarrhea, unspecified: Secondary | ICD-10-CM

## 2024-01-18 DIAGNOSIS — R531 Weakness: Secondary | ICD-10-CM | POA: Diagnosis not present

## 2024-01-18 DIAGNOSIS — D649 Anemia, unspecified: Secondary | ICD-10-CM

## 2024-01-18 DIAGNOSIS — I739 Peripheral vascular disease, unspecified: Secondary | ICD-10-CM | POA: Diagnosis not present

## 2024-01-18 LAB — GLUCOSE, CAPILLARY
Glucose-Capillary: 83 mg/dL (ref 70–99)
Glucose-Capillary: 67 mg/dL — ABNORMAL LOW (ref 70–99)
Glucose-Capillary: 83 mg/dL (ref 70–99)
Glucose-Capillary: 121 mg/dL — ABNORMAL HIGH (ref 70–99)
Glucose-Capillary: 151 mg/dL — ABNORMAL HIGH (ref 70–99)

## 2024-01-18 LAB — RENAL FUNCTION PANEL
Albumin: 3.4 g/dL — ABNORMAL LOW (ref 3.5–5.0)
Anion gap: 11 (ref 5–15)
BUN: 23 mg/dL (ref 8–23)
CO2: 28 mmol/L (ref 22–32)
Calcium: 8.6 mg/dL — ABNORMAL LOW (ref 8.9–10.3)
Chloride: 101 mmol/L (ref 98–111)
Creatinine, Ser: 3.02 mg/dL — ABNORMAL HIGH (ref 0.44–1.00)
GFR, Estimated: 16 mL/min — ABNORMAL LOW (ref >60)
Glucose, Bld: 85 mg/dL (ref 70–99)
Phosphorus: 4.2 mg/dL (ref 2.5–4.6)
Potassium: 5.1 mmol/L (ref 3.5–5.1)
Sodium: 141 mmol/L (ref 135–145)

## 2024-01-18 LAB — HEPATITIS B SURFACE ANTIGEN: Hepatitis B Surface Ag: NONREACTIVE

## 2024-01-18 LAB — CBC
HCT: 28.1 % — ABNORMAL LOW (ref 36.0–46.0)
Hemoglobin: 8.6 g/dL — ABNORMAL LOW (ref 12.0–15.0)
MCH: 26.7 pg (ref 26.0–34.0)
MCHC: 30.6 g/dL (ref 30.0–36.0)
MCV: 87.3 fL (ref 80.0–100.0)
Platelets: 330 K/uL (ref 150–400)
RBC: 3.22 MIL/uL — ABNORMAL LOW (ref 3.87–5.11)
RDW: 17.1 % — ABNORMAL HIGH (ref 11.5–15.5)
WBC: 7.2 K/uL (ref 4.0–10.5)
nRBC: 2.4 % — ABNORMAL HIGH (ref 0.0–0.2)

## 2024-01-18 LAB — BPAM RBC
Blood Product Expiration Date: 202511182359
ISSUE DATE / TIME: 202510271354
Unit Type and Rh: 5100

## 2024-01-18 LAB — TYPE AND SCREEN
ABO/RH(D): B POS
Antibody Screen: NEGATIVE
Unit division: 0

## 2024-01-18 MED ORDER — BUSPIRONE HCL 15 MG PO TABS
7.5000 mg | ORAL_TABLET | Freq: Two times a day (BID) | ORAL | Status: DC
  Administered 2024-01-18 (×2): 7.5 mg via ORAL
  Filled 2024-01-18 (×2): qty 1

## 2024-01-18 MED ORDER — EZETIMIBE 10 MG PO TABS
10.0000 mg | ORAL_TABLET | Freq: Every day | ORAL | Status: DC
  Administered 2024-01-18: 10 mg via ORAL
  Filled 2024-01-18: qty 1

## 2024-01-18 MED ORDER — TORSEMIDE 20 MG PO TABS: 100.0000 mg | ORAL_TABLET | ORAL | Status: DC

## 2024-01-18 MED ORDER — CINACALCET HCL 30 MG PO TABS
90.0000 mg | ORAL_TABLET | ORAL | Status: DC
  Administered 2024-01-19: 90 mg via ORAL
  Filled 2024-01-18: qty 3

## 2024-01-18 MED ORDER — APIXABAN 2.5 MG PO TABS
2.5000 mg | ORAL_TABLET | Freq: Two times a day (BID) | ORAL | Status: DC
  Administered 2024-01-18: 2.5 mg via ORAL
  Filled 2024-01-18: qty 1

## 2024-01-18 MED ORDER — AMLODIPINE BESYLATE 5 MG PO TABS: 5.0000 mg | ORAL_TABLET | Freq: Every day | ORAL | Status: DC

## 2024-01-18 MED ORDER — FENTANYL CITRATE (PF) 50 MCG/ML IJ SOSY: 12.5000 ug | PREFILLED_SYRINGE | INTRAMUSCULAR | Status: DC | PRN

## 2024-01-18 MED ORDER — NITROGLYCERIN 0.2 MG/HR TD PT24
0.2000 mg | MEDICATED_PATCH | Freq: Every day | TRANSDERMAL | Status: DC
  Administered 2024-01-18: 0.2 mg via TRANSDERMAL
  Filled 2024-01-18: qty 1

## 2024-01-18 MED ORDER — CALCITRIOL 0.25 MCG PO CAPS: 1.0000 ug | ORAL_CAPSULE | ORAL | Status: DC

## 2024-01-18 MED ORDER — SERTRALINE HCL 50 MG PO TABS: 75.0000 mg | ORAL_TABLET | Freq: Every day | ORAL | Status: DC

## 2024-01-18 MED ORDER — CHLORHEXIDINE GLUCONATE CLOTH 2 % EX PADS
6.0000 | MEDICATED_PAD | Freq: Every day | CUTANEOUS | Status: DC
  Administered 2024-01-18: 6 via TOPICAL

## 2024-01-18 MED ORDER — METOPROLOL SUCCINATE ER 50 MG PO TB24
50.0000 mg | ORAL_TABLET | Freq: Every day | ORAL | Status: DC
  Administered 2024-01-18: 50 mg via ORAL
  Filled 2024-01-18: qty 1

## 2024-01-18 NOTE — Progress Notes (Signed)
   01/18/24 2006  BiPAP/CPAP/SIPAP  Reason BIPAP/CPAP not in use Non-compliant   Patient declines CPAP. No unit in room at this time.

## 2024-01-18 NOTE — Progress Notes (Addendum)
 At 0746: Made Dr Vicci aware CBG 67 alert and drank two Apple juices.   AT 0847: Made Dr Vicci aware CBG 83, and going to eat breakfast. Asymptomatic. No prns ordered for hypoglycemia.   At 1044: RN called to speak with [name] regarding reconciling patients home medications. Left voicemail.   1046: RN spoke to pharmacy, ok to give verified home medications and hold off on the meds that are not verified by pharmacy at this time.   1055 Made Dr Vicci aware that the gangrenous wound to left hand is wrapped with old dressing, son says needs to be changed daily, No wound care orders. Patient has metoprolol  50 mg ER po that pharmacy verified however unable to verify other meds such as norvasc  because unable to reach daughter Augusto to reconcile.   1102: Made Dr Vicci aware that RN spoke to [facility], and for the left hand  they are currently cleaning with vashe, applying nitro 0.2 mg/hr transdermal patch to left medial wrist, apply vashe moist gauze to wound bed, apply abd pad, kerlix, and ace bandage and changing daily and prn soilage. MD dc'd wound care nurse consult and agreed to continue this wound care for the left hand   1500: HR and BP checked and documented. Changed dressing to left hand per order once able to obtain the necessary wound cleaner Vashe from materials management. Applied the nicotine patch to left wrist as ordered. Took images of hand for chart and made Dr Vicci aware of this and that patient stated she would be getting her left hand amputated soon, but not sure when.   1537: Made Dr Vicci aware that patient had incontinent small GREEN BM that was dry and unable to be collected for specimen. Patient receiving bed bath at this time. Skin intact to buttocks, no  redness noted.

## 2024-01-18 NOTE — Progress Notes (Addendum)
 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  She had a colonoscopy performed 01/01/2024 with Dr. Cindie with findings of diverticulosis, nonbleeding internal hemorrhoids and 4 ulcers in the descending colon and in the transverse colon that were clipped.  She was discharged home in stable condition with a hemoglobin of 7.4.  Hemoglobin has been monitored and dialysis.  She was sent to ER by EMS after dialysis today for hemoglobin of 6.4.  Recheck hemoglobin in the ED 7.0.  Patient is being typed and crossed and transfused 1 unit PRBC and admission requested for observation.  GI was consulted as well and recommended observation.   Assessment and Plan:  Chronic blood loss anemia -- Hg up to 8.6 after PRBC transfusion on 10/27 -- she tested stool guaiac positive -- initially held anticoagulation but now GI says can restart on 10/28 -- recheck CBC in AM  -- appreciate GI consultation and recommendations -- they are planning outpatient EGD and capsule study   Dry gangrene left hand/forearm -- she is being followed by hand surgeon (Anshul Estela) Agarwala, M.D) and eventually will need amputation -- monitor s/s of systemic infection where in my opinion she would need more urgent surgical management for source control -- she has appt with Dr. Arlinda on 01/25/24 for recheck and decision regarding further management -- continue  wrapping it per surgery and wound care recommendations  -- reportedly has been on cefazolin  with outpatient HD MWF per her hand surgeon  Diarrhea -- reporting several days of watery diarrhea -- given she has been on cefazolin  with HD for several months, will check for C diff -- c diff study pending; enteric precautions ordered    Paroxysmal atrial fibrillation  -- ok to resume apixaban  per GI  -- unsure if she will be able to safely continue this    Uncontrolled type 2 DM with hyperglycemia and vascular complications -- given ESRD, will treat with SSI coverage (very sensitive) CBG (last 3)  Recent Labs    01/18/24 0731 01/18/24 0756 01/18/24 1124  GLUCAP 67* 83 121*   Hypoglycemia -- mild, treated -- she is not on basal insulin  at this time -- very minimal SSI coverage, stop prandial coverage -- continue frequent CBG monitoring   PAD - severe -- resume home meds if able    ESRD on HD MWF -- pt completed HD treatment 10/27 -- will consult to nephrology on 10/28  -- HD on 10/29 per nephrology team   Essential hypertension -- resume home metoprolol  succinate   OSA -- will offer nightly CPAP therapy in hospital    DVT prophylaxis: apixaban  Code Status: Full  Family Communication: daughter updated at bedside Disposition:    Consultants:  nephrology Procedures:   Antimicrobials:  Cefazolin  with HD MWF  Subjective: Pt says that she has been having diarrhea for past few days, watery stools.    Objective: Vitals:   01/17/24 2120 01/18/24 0204 01/18/24 0554 01/18/24 1030  BP: 135/82 (!) 157/70 ROLLEN)  151/66 (!) 137/59  Pulse: 67 64 68   Resp: 14 16 18 18   Temp: 98.1 F (36.7 C) 97.7 F (36.5 C) 98.1 F (36.7 C) 98.4 F (36.9 C)  TempSrc: Oral Oral Oral Oral  SpO2: 100% 94% 95% 97%  Weight:      Height:       No intake or output data in the 24 hours ending 01/18/24 1238 Filed Weights   01/17/24 1111  Weight: 65.3 kg   Examination:  General exam:  Appears calm and comfortable  Respiratory system: Clear to auscultation. Respiratory effort normal. Cardiovascular system: normal S1 & S2 heard. No JVD, murmurs, rubs, gallops or clicks. No pedal edema. Gastrointestinal system: Abdomen is nondistended, soft and nontender. No organomegaly or masses felt. Normal bowel sounds heard. Central nervous system: Alert and oriented. No focal neurological deficits. Extremities: Symmetric 5 x 5 power. Skin: No rashes, lesions or ulcers. Psychiatry: Judgement and insight appear normal. Mood & affect appropriate.   Data Reviewed: I have personally reviewed following labs and imaging studies  CBC: Recent Labs  Lab 01/17/24 1129 01/18/24 0454  WBC 7.7 7.2  NEUTROABS 6.2  --   HGB 7.0* 8.6*  HCT 23.3* 28.1*  MCV 87.3 87.3  PLT 325 330    Basic Metabolic Panel: Recent Labs  Lab 01/17/24 1129 01/18/24 0454  NA 140 141  K 3.2* 5.1  CL 100 101  CO2 30 28  GLUCOSE 79 85  BUN 16 23  CREATININE 2.00* 3.02*  CALCIUM  8.5* 8.6*  PHOS  --  4.2    CBG: Recent Labs  Lab 01/17/24 2123 01/18/24 0303 01/18/24 0731 01/18/24 0756 01/18/24 1124  GLUCAP 80 83 67* 83 121*    No results found for this or any previous visit (from the past 240 hours).   Radiology Studies: No results found.  Scheduled Meds:  amLODipine   5 mg Oral Daily   apixaban   2.5 mg Oral BID   busPIRone   7.5 mg Oral BID   [START ON 01/19/2024] calcitRIOL   1 mcg Oral Q M,W,F-HD   Chlorhexidine  Gluconate Cloth  6 each Topical Q0600   [START ON 01/19/2024] cinacalcet   90 mg Oral Q M,W,F   ezetimibe   10 mg Oral Daily   insulin  aspart  0-6 Units Subcutaneous TID WC   metoprolol  succinate  50 mg Oral Daily   nitroGLYCERIN   0.2 mg Transdermal Daily   pantoprazole   40 mg Oral QPM   sertraline   75 mg Oral Daily   torsemide   100 mg Oral Q T,Th,S,Su   Continuous Infusions:   LOS: 0 days   Time spent: 56 mins  Kevron Patella Vicci, MD How to contact the [facility] Attending or  Consulting provider 7A - 7P or covering provider during after hours 7P -7A, for this patient?  Check the care team in [facility] and look for a) attending/consulting [facility] provider listed and b) the [facility] team listed Log into [email or web address] to find provider on call.  Locate the [facility] provider you are looking for under [facility] and page to a number that you can be directly reached. If you still have difficulty reaching the provider, please page the [facility] (Director on Call) for the Hospitalists listed on amion for assistance.  01/18/2024, 12:38 PM

## 2024-01-18 NOTE — Plan of Care (Signed)

## 2024-01-18 NOTE — Consult Note (Signed)
 [facility] Renal Consultation Note    Indication for Consultation:  Management of ESRD/hemodialysis; anemia, hypertension/volume and secondary hyperparathyroidism  HPI: Kathryn Beck is a 68 y.o. female  with history of ESRD Monday Wednesday Friday at [facility], atrial fibrillation, peripheral artery disease, dry gangrene of LUE, hypertension, type 2 diabetes melitis, chronic HFpEF, aortic stenosis and recent hospitalization with recurrent GIB (thought to be due to diverticular bleeding s/p polypectomy) who presented to [facility] ER from her outpatient HD unit (after HD) with anemia.  Her Hgb was noted to be 6.4 at the HD clinic and sent to the ED.  In the ED,  Temp 98.3, BP 148/66, HR 71, SpO2 97%.  Labs notable for Hgb 7.  She was admitted under observation and received 1 unit PRBC with improved Hgb to 8.6 this morning.  We were consulted to provide dialysis during her admission.  Of note, she did receive heparin  at her outpatient unit.  She denies any hematochezia or BRBPR but her son reports dark stools.  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  Last Assessment & Plan:    Multiple polyps with surveillance due in 2021.  IMO SNOMED Dx Update Oct 2024     History of pneumonia 2019   Hypoxia 04/03/2019   Memory loss    Mixed hyperlipidemia    Not currently working due to disabled status 06/13/2018   Formatting of this note might be different from the original.  Last Assessment & Plan:   Formatting of this note might be different from the original.  Reports being  disabled since April/May 2019 , hospitalized then in a niursing home, now on dialysis and is unable to work     Obesity    Pancreatitis    Peritoneal dialysis catheter in place    Pneumonia due to COVID-19 virus 04/02/2019   Sleep apnea    Noncompliant with CPAP   Stroke (HCC)    mini stroke   Type 2 diabetes mellitus (HCC)    Past Surgical History:  Procedure Laterality Date   ABDOMINAL AORTOGRAM W/LOWER EXTREMITY N/A 04/30/2022   Procedure: ABDOMINAL AORTOGRAM W/LOWER EXTREMITY;  Surgeon: Magda Debby SAILOR, MD;  [facility]: [facility] IN[facility];  Service: Cardiovascular;  Laterality: N/A;   ABDOMINAL AORTOGRAM W/LOWER EXTREMITY N/A 07/21/2022   Procedure: ABDOMINAL AORTOGRAM W/LOWER EXTREMITY;  Surgeon: Serene Gaile ORN, MD;  Location: [facility];  Service: Cardiovascular;  Laterality: N/A;   ABDOMINAL HYSTERECTOMY     ACHILLES TENDON LENGTHENING  08/15/2022  Procedure: ACHILLES TENDON LENGTHENING;  Surgeon: Silva Juliene SAUNDERS, DPM;  Location: [facility] OR;  Service: Podiatry;;   AMPUTATION Right 05/29/2022   Procedure: RIGHT BELOW THE KNEE AMPUTATION;  Surgeon: Harden Jerona GAILS, MD;  Location: [facility] OR;  Service: Orthopedics;  Laterality: Right;   AMPUTATION Left 09/04/2022   Procedure: AMPUTATION FOOT, serial irrigation;  Surgeon: Joya Stabs, DPM;  Location: [facility] OR;  Service: Podiatry;  Laterality: Left;  Surgical team to do block   AMPUTATION Left 10/07/2022   Procedure: LEFT BELOW KNEE AMPUTATION;  Surgeon: Harden Jerona GAILS, MD;  Location: [facility] OR;  Service: Orthopedics;  Laterality: Left;   AMPUTATION FINGER  Left 09/29/2023   Procedure: AMPUTATION, FINGER;  Surgeon: Harden Jerona GAILS, MD;  Location: [facility] OR;  Service: Orthopedics;  Laterality: Left;  LEFT HAND LONG FINGER RAY AMPUTATION   AV FISTULA PLACEMENT Left 09/02/2017   Procedure: creation of left arm ARTERIOVENOUS (AV) FISTULA;  Surgeon: Serene Gaile ORN, MD;  Location: [facility] OR;  Service: Vascular;  Laterality: Left;   COLONOSCOPY  2008   Dr. Harvey: normal    COLONOSCOPY N/A 12/18/2016   Dr. Harvey: multiple tubular adenomas, internal hemorrhoids. Surveillance in 3 years    COLONOSCOPY N/A 12/05/2023   Procedure: COLONOSCOPY;  Surgeon: Cindie Carlin POUR, DO;  Location: [facility];  Service: Endoscopy;  Laterality: N/A;   COLONOSCOPY N/A 12/17/2023   Procedure: COLONOSCOPY;  Surgeon: Shaaron Lamar HERO, MD;  [facility]: [facility];  Service: Endoscopy;  Laterality: N/A;   COLONOSCOPY N/A 01/01/2024   Procedure: COLONOSCOPY;  Surgeon: Cindie Carlin POUR, DO;  [facility];  Service: Endoscopy;  Laterality: N/A;   ESOPHAGEAL DILATION N/A 10/13/2015   Procedure: ESOPHAGEAL DILATION;  Surgeon: Claudis RAYMOND Rivet, MD;  [facility]: [facility];  Service: Endoscopy;  Laterality: N/A;   ESOPHAGEAL DILATION N/A 12/03/2023   Procedure: DILATION, ESOPHAGUS;  Surgeon: Cindie Carlin POUR, DO;  [facility]: [facility];  Service: Endoscopy;  Laterality: N/A;   ESOPHAGOGASTRODUODENOSCOPY N/A 10/13/2015   Dr. Rivet: chronic gastritis on path, no H.pylori. Empiric dilation    ESOPHAGOGASTRODUODENOSCOPY N/A 12/18/2016   Dr. Harvey: mild gastritis. BRAVO study revealed uncontrolled GERD. Dysphagia secondary to uncontrolled reflux   ESOPHAGOGASTRODUODENOSCOPY N/A 12/03/2023   Procedure: EGD (ESOPHAGOGASTRODUODENOSCOPY);  Surgeon: Cindie Carlin POUR, DO;  [facility];  Service: Endoscopy;  Laterality: N/A;   FOOT SURGERY Bilateral    nerve     INCISION AND DRAINAGE OF WOUND Left 10/30/2023   Procedure: IRRIGATION AND DEBRIDEMENT WOUND;  Surgeon: Arlinda Buster, MD;  Location: [facility] OR;  Service: Orthopedics;  Laterality: Left;  LEFT HAND WOUND   LEFT HEART CATH AND CORONARY ANGIOGRAPHY N/A 12/29/2018   Procedure: LEFT HEART CATH AND CORONARY ANGIOGRAPHY;  Surgeon: Dann Candyce RAMAN, MD;  Location: [facility] INVASIVE CV LAB;  Service: Cardiovascular;  Laterality: N/A;   LOWER EXTREMITY ANGIOGRAPHY Right 05/04/2022   Procedure: Lower Extremity Angiography;  Surgeon: Lanis Fonda BRAVO, MD;  Location: [facility] INVASIVE CV LAB;  Service: Cardiovascular;  Laterality: Right;   LUNG BIOPSY     MASS EXCISION Right 01/09/2013   Procedure: EXCISION OF NEOPLASM OF RIGHT  AXILLA  AND EXCISION OF NEOPLASM OF LEFT AXILLA;  Surgeon: Oneil DELENA Budge, MD;  Location: [facility] ORS;  Service: General;  Laterality: Right;  procedure end @ 08:23   MYRINGOTOMY WITH TUBE PLACEMENT Bilateral 04/28/2017   Procedure: BILATERAL MYRINGOTOMY WITH TUBE PLACEMENT;  Surgeon: Karis Clunes, MD;  Location: [facility] OR;  Service: ENT;  Laterality: Bilateral;   PERIPHERAL VASCULAR BALLOON ANGIOPLASTY Right 05/04/2022   Procedure: PERIPHERAL VASCULAR BALLOON ANGIOPLASTY;  Surgeon: Lanis Fonda BRAVO, MD;  Location: [facility] INVASIVE CV LAB;  Service: Cardiovascular;  Laterality: Right;  PT   PERIPHERAL VASCULAR INTERVENTION Right 05/04/2022   Procedure: PERIPHERAL VASCULAR INTERVENTION;  Surgeon: Lanis Fonda BRAVO, MD;  Location: [facility] INVASIVE CV LAB;  Service: Cardiovascular;  Laterality: Right;  SFA   PERIPHERAL VASCULAR INTERVENTION Left 07/21/2022   Procedure: PERIPHERAL VASCULAR INTERVENTION;  Surgeon: Serene Gaile ORN, MD;  [facility]: [facility];  Service: Cardiovascular;  Laterality: Left;   REVISION OF ARTERIOVENOUS GORETEX GRAFT Left 05/04/2018   Procedure: TRANSPOSITION OF CEPHALIC VEIN ARTERIOVENOUS FISTULA LEFT ARM;  Surgeon: Oris Krystal FALCON, MD;  Location: [facility] OR;  Service: Vascular;  Laterality: Left;   SAVORY DILATION N/A 12/18/2016   Procedure: SAVORY DILATION;  Surgeon: Harvey Margo CROME, MD;  [facility];  Service: Endoscopy;  Laterality: N/A;   TRANSMETATARSAL AMPUTATION Left 08/15/2022   Procedure: TRANSMETATARSAL AMPUTATION;  Surgeon: Silva Juliene SAUNDERS, DPM;  Location: [facility] OR;  Service: Podiatry;  Laterality: Left;   Family History:   Family History  Problem Relation Age of Onset   Hypertension Father    Hypercholesterolemia Father    Arthritis Father    Hypertension Sister    Hypercholesterolemia Sister    Breast cancer Sister    Hypertension Sister    Colon cancer Neg Hx    Colon polyps Neg Hx    Social History:  reports that she has never smoked. She has never been exposed to tobacco smoke. She has never used smokeless tobacco. She reports that she does not drink alcohol and does not use drugs. Allergies  Allergen Reactions   Ace Inhibitors Anaphylaxis and Swelling   Penicillins Itching and Swelling    Has tolerated cefazolin  on multiple occasions    Statins Other (See Comments)    Elevated LFT's   Albuterol  Swelling   Prior to Admission medications   Medication Sig Start Date End Date Taking? Authorizing Provider  acetaminophen  (TYLENOL ) 500 MG tablet Take 1,000 mg by mouth every 6 (six) hours as needed for mild pain (pain score 1-3).    [provider]  amLODipine  (NORVASC ) 10 MG tablet Take 0.5 tablets (5 mg total) by mouth daily. 10/28/23   Pearlean Manus, MD  apixaban  (ELIQUIS ) 2.5 MG TABS tablet Take 1 tablet (2.5 mg total) by mouth 2 (two) times daily. 01/04/24   Johnson, Clanford L, MD  B Complex-C-Folic Acid  (RENA-VITE RX) 1 MG TABS Take 1 tablet by mouth daily. 04/15/22   [provider]  busPIRone  (BUSPAR ) 7.5 MG tablet Take 1 tablet (7.5 mg total) by mouth 2 (two) times daily. 11/24/23   Antonetta Rollene BRAVO, MD  calcitRIOL  (ROCALTROL ) 0.5 MCG capsule Take 2 capsules (1 mcg total) by mouth every Monday, Wednesday, and Friday with hemodialysis. 11/05/23   Caleen Burgess BROCKS, MD  cinacalcet  (SENSIPAR ) 30 MG tablet Take 3 tablets (90 mg total) by mouth  every Monday, Wednesday, and Friday. 12/08/23   Johnson, Clanford L, MD  ezetimibe  (ZETIA ) 10 MG tablet Take 1 tablet (10 mg total) by mouth daily. 04/27/23   Antonetta Rollene BRAVO, MD  insulin  aspart (NOVOLOG ) 100 UNIT/ML injection Inject 0-6 Units into the skin 3 (three) times daily with meals. CBG 70 - 120: 0 units  CBG 121 - 150: 0 units  CBG 151 - 200: 1 unit  CBG 201-250: 2 units  CBG 251-300:  3 units  CBG 301-350: 4 units  CBG 351-400: 5 units  CBG > 400: Give 10 units and call MD 11/12/23   Willette Adriana LABOR, MD  insulin  glargine, 2 Unit Dial , (TOUJEO  MAX SOLOSTAR) 300 UNIT/ML Solostar Pen Inject 30 Units into the skin daily. May need to slowly increase the dose depending upon your blood sugar, follow-up with PCP 01/01/23   Trixie File, MD  metoprolol  succinate (TOPROL -XL) 50 MG 24 hr tablet TAKE ONE TABLET BY MOUTH ONCE DAILY WITH FOOD (TAKE AFTER DIALYSIS) 12/20/23   Antonetta Rollene BRAVO, MD  nitroGLYCERIN  (NITRODUR - DOSED IN MG/24 HR) 0.2 mg/hr patch Place 1 patch (0.2 mg total) onto the skin daily. 12/28/23   Agarwala, Anshul, MD  ondansetron  (ZOFRAN ) 4 MG tablet Take 4 mg by mouth every 8 (eight) hours as needed for nausea or vomiting. 11/26/23   [provider]  pantoprazole  (PROTONIX ) 40 MG tablet Take 1 tablet (40 mg total) by mouth daily. 12/08/23   Johnson, Clanford L, MD  sertraline  (ZOLOFT ) 50 MG tablet Take 1.5 tablets (75 mg total) by mouth daily. Take one and a half tablets once daily 01/02/24   Johnson, Clanford L, MD  torsemide  (DEMADEX ) 100 MG tablet Take 1 tablet (100 mg total) by mouth every Tuesday, Thursday, Saturday, and Sunday. Non-dialysis days 01/02/24   Vicci Afton CROME, MD  [name]  500 MG chewable tablet Chew 1-2 tablets (500-1,000 mg total) by mouth See admin instructions. Take 1000mg  (2 tablets) by mouth with meals and 500mg  (1 tablet) with snacks 08/23/23   Antonetta Rollene BRAVO, MD  FLUoxetine (PROZAC) 10 MG capsule Take 10 mg by mouth daily.    05/28/11   [provider]  glipiZIDE  (GLUCOTROL ) 10 MG tablet Take 10 mg by mouth 2 (two) times daily before a meal.    05/28/11  [provider]   Current Facility-Administered Medications  Medication Dose Route Frequency Provider Last Rate Last Admin   acetaminophen  (TYLENOL ) tablet 650 mg  650 mg Oral Q6H PRN Johnson, Clanford L, MD       Or   acetaminophen  (TYLENOL ) suppository 650 mg  650 mg Rectal Q6H PRN Johnson, Clanford L, MD       amLODipine  (NORVASC ) tablet 5 mg  5 mg Oral Daily Johnson, Clanford L, MD       bisacodyl  (DULCOLAX) EC tablet 5 mg  5 mg Oral Daily PRN Johnson, Clanford L, MD       busPIRone  (BUSPAR ) tablet 7.5 mg  7.5 mg Oral BID Johnson, Clanford L, MD       [START ON 01/19/2024] calcitRIOL  (ROCALTROL ) capsule 1 mcg  1 mcg Oral Q M,W,F-HD Johnson, Clanford L, MD       [START ON 01/19/2024] cinacalcet  (SENSIPAR ) tablet 90 mg  90 mg Oral Q M,W,F Johnson, Clanford L, MD       ezetimibe  (ZETIA ) tablet 10 mg  10 mg Oral Daily Johnson, Clanford L, MD       fentaNYL  (SUBLIMAZE ) injection 12.5 mcg  12.5 mcg Intravenous Q2H PRN Johnson, Clanford L, MD       hydrALAZINE  (APRESOLINE ) injection 10 mg  10 mg Intravenous Q4H PRN Johnson, Clanford L, MD       insulin  aspart (novoLOG ) injection 0-6 Units  0-6 Units Subcutaneous TID WC Johnson, Clanford L, MD       insulin  aspart (novoLOG ) injection 2 Units  2 Units Subcutaneous TID WC Johnson, Clanford L, MD       metoprolol  succinate (TOPROL -XL) 24  hr tablet 50 mg  50 mg Oral Daily Johnson, Clanford L, MD       nitroGLYCERIN  (NITRODUR - Dosed in mg/24 hr) patch 0.2 mg  0.2 mg Transdermal Daily Johnson, Clanford L, MD       oxyCODONE  (Oxy IR/ROXICODONE ) immediate release tablet 5 mg  5 mg Oral TID PRN Johnson, Clanford L, MD       pantoprazole  (PROTONIX ) EC tablet 40 mg  40 mg Oral QPM Johnson, Clanford L, MD       sertraline  (ZOLOFT ) tablet 75 mg  75 mg Oral Daily Johnson, Clanford L, MD       torsemide  (DEMADEX ) tablet 100 mg   100 mg Oral Q T,Th,S,Su Johnson, Clanford L, MD       Facility-Administered Medications Ordered in Other Encounters  Medication Dose Route Frequency Provider Last Rate Last Admin   0.9 %  sodium chloride  infusion   Intravenous Continuous Lockamy, Randi L, NP-C 20 mL/hr at 01/01/24 0904 Continued from Pre-op  at 01/01/24 0904   Labs: Basic Metabolic Panel: Recent Labs  Lab 01/17/24 1129 01/18/24 0454  NA 140 141  K 3.2* 5.1  CL 100 101  CO2 30 28  GLUCOSE 79 85  BUN 16 23  CREATININE 2.00* 3.02*  CALCIUM  8.5* 8.6*  PHOS  --  4.2   Liver Function Tests: Recent Labs  Lab 01/17/24 1129 01/18/24 0454  AST 25  --   ALT 5  --   ALKPHOS 95  --   BILITOT 0.3  --   PROT 6.3*  --   ALBUMIN  3.5 3.4*   No results for input(s): LIPASE, AMYLASE in the last 168 hours. No results for input(s): AMMONIA in the last 168 hours. CBC: Recent Labs  Lab 01/17/24 1129 01/18/24 0454  WBC 7.7 7.2  NEUTROABS 6.2  --   HGB 7.0* 8.6*  HCT 23.3* 28.1*  MCV 87.3 87.3  PLT 325 330   Cardiac Enzymes: No results for input(s): CKTOTAL, CKMB, CKMBINDEX, TROPONINI in the last 168 hours. CBG: Recent Labs  Lab 01/17/24 1705 01/17/24 2123 01/18/24 0303 01/18/24 0731 01/18/24 0756  GLUCAP 85 80 83 67* 83   Iron Studies:  Recent Labs    01/17/24 1129  IRON 30  TIBC 175*  FERRITIN 1,205*   Studies/Results: No results found.  ROS: Pertinent items are noted in HPI. Physical Exam: Vitals:   01/17/24 1746 01/17/24 2120 01/18/24 0204 01/18/24 0554  BP: (!) 152/66 135/82 (!) 157/70 (!) 151/66  Pulse: 66 67 64 68  Resp: 20 14 16 18   Temp: 98.3 F (36.8 C) 98.1 F (36.7 C) 97.7 F (36.5 C) 98.1 F (36.7 C)  TempSrc: Oral Oral Oral Oral  SpO2: 98% 100% 94% 95%  Weight:      Height:          Weight change:  No intake or output data in the 24 hours ending 01/18/24 1025 BP (!) 151/66 (BP Location: Right Arm)   Pulse 68   Temp 98.1 F (36.7 C) (Oral)   Resp 18   Ht  5' 6 (1.676 m)   Wt 65.3 kg   SpO2 95%   BMI 23.24 kg/m  General appearance: alert, cooperative, and no distress Head: Normocephalic, without obvious abnormality, atraumatic Resp: clear to auscultation bilaterally Cardio: regular rate and rhythm, S1, S2 normal, no murmur, click, rub or gallop GI: soft, non-tender; bowel sounds normal; no masses,  no organomegaly Extremities: s/p bilateral BKA's, no edema, LUE AVF +T/B, dry  gangrene of left 3rd finger and wrapping in place over left hand Dialysis Access:  LUE AVF  Dialysis Orders: MWF [facility] 3 hours and 30 minutes  EDW 70.5 kg (below edw here but weighs with lift equipment) 2k/ 2.5 ca bath LUE AVF BF 400 DF 500 Heparin  1000 units loading and then 1000 units an hour (will stop here) Meds: mircera 90 mcg every 2 weeks and last given on 01/10/24   Assessment/Plan:  Recurrent GIB - given 1 unit PRBC with appropriate increase in Hgb.  Eliquis  on hold.  Will stop heparin  with HD as well.  GI to see  ESRD -  continue with HD on MWF schedule  Hypertension/volume  -  stable  Anemia  - as above, received micera 90 mcg on 01/10/24  Metabolic bone disease -  continue with home meds  Nutrition -  renal diet, carb modified  Dry gangrene of left hand/forearm - followed by ortho and may need amputation.  Unclear if she has seen vascular surgery to r/o steal syndrome from her LUE AVF.  She should follow up with [facility] after discharge.   Fairy RONAL Sellar, MD [facility], [facility]  01/18/2024, 10:25 AM

## 2024-01-18 NOTE — Progress Notes (Signed)
 Pt receives out-pt HD at [facility], MWF 0700 chair time. Clinic informed of pt arrival. Will assist as needed.    [facility] Manoj Enriquez Dialysis Navigator [phone]

## 2024-01-18 NOTE — Care Management Obs Status (Signed)
 MEDICARE OBSERVATION STATUS NOTIFICATION   Patient Details  Name: Kathryn Beck MRN: [id number] Date of Birth: 1955/09/12   Medicare Observation Status Notification Given:  Yes    [name] 01/18/2024, 10:33 AM

## 2024-01-18 NOTE — TOC Initial Note (Signed)
 Transition of Care [facility]) - Initial/Assessment Note    Patient Details  Name: Kathryn Beck MRN: [id number] Date of Birth: 12/20/1955  Transition of [facility]) CM/SW Contact:    Sharlyne Stabs, RN Phone Number: 01/18/2024, 10:17 AM  Clinical Narrative:          Patient admitted in OBS with anemia due to chronic blood loss. IPCM notes per chart review that pt is high risk for readmission. Patient  lives with her spouse. Family assists with ADLs. Currently acitve with with [facility] HHRN/PT.  She has a wheelchair, hospital bed, and hoyer lift in the home. IPCM following.    Expected Discharge Plan: Home w Home Health Services Barriers to Discharge: Continued Medical Work up   Patient Goals and CMS Choice Patient states their goals for this hospitalization and ongoing recovery are:: return home CMS Medicare.gov Compare Post Acute Care list provided to:: Patient Represenative (must comment) Choice offered to / list presented to : Spouse [place] ownership interest in [facility].provided to:: Spouse    Expected Discharge Plan and Services       Living arrangements for the past 2 months: Single Family Home                    HH Agency: [facility]     Prior Living Arrangements/Services Living arrangements for the past 2 months: Single Family Home Lives with:: Spouse Patient language and need for interpreter reviewed:: Yes Do you feel safe going back to the place where you live?: Yes      Need for Family Participation in Patient Care: Yes (Comment) Care giver support system in place?: Yes (comment) Current home services: DME Criminal Activity/Legal Involvement Pertinent to Current Situation/Hospitalization: No - Comment as needed  Activities of Daily Living   ADL Screening (condition at time of admission) Independently performs ADLs?: No Does the patient have a NEW difficulty with bathing/dressing/toileting/self-feeding that is expected to last >3  days?: No Does the patient have a NEW difficulty with getting in/out of bed, walking, or climbing stairs that is expected to last >3 days?: No Does the patient have a NEW difficulty with communication that is expected to last >3 days?: No Is the patient deaf or have difficulty hearing?: No Does the patient have difficulty seeing, even when wearing glasses/contacts?: No Does the patient have difficulty concentrating, remembering, or making decisions?: Yes  Permission Sought/Granted                  Emotional Assessment     Affect (typically observed): Accepting Orientation: : Oriented to Self, Oriented to Place, Oriented to  Time, Oriented to Situation Alcohol / Substance Use: Not Applicable Psych Involvement: No (comment)  Admission diagnosis:  Melena [K92.1] Hypokalemia [E87.6] Weakness [R53.1] Anemia due to chronic blood loss [D50.0] Gastrointestinal hemorrhage, unspecified gastrointestinal hemorrhage type [K92.2] Anemia, unspecified type [D64.9] Patient Active Problem List   Diagnosis Date Noted   Anemia due to chronic blood loss 01/17/2024   S/P bilateral BKA (below knee amputation) (HCC) 01/17/2024   Acquired thrombophilia 01/17/2024   Rectal bleeding 12/29/2023   Acute blood loss anemia 12/29/2023   Lower GI bleed 12/16/2023   Dry Gangrene of left upper extremity 12/06/2023   Acute GI bleeding 12/01/2023   Dry gangrene (HCC) - left hand 10/29/2023   PAF (paroxysmal atrial fibrillation) (HCC) 10/27/2023   Mass of breast 08/30/2023   Depression, major, single episode, moderate (HCC) 08/30/2023   GAD (generalized anxiety disorder)  08/30/2023   Decreased strength, endurance, and mobility 08/30/2023   Gangrene of finger of left hand (HCC) 08/30/2023   Uncontrolled type 2 diabetes mellitus with hyperglycemia (HCC) 08/30/2023   Hearing loss 04/27/2023   Otalgia of both ears 04/27/2023   Acute confusion 12/10/2022   Generalized weakness 11/18/2022   Paronychia of  finger of right hand 11/13/2022   Dehiscence of amputation stump of left lower extremity (HCC) 10/07/2022   Status post below-knee amputation of left lower extremity (HCC) 10/07/2022   Subacute osteomyelitis, left ankle and foot (HCC) 09/03/2022   Left foot infection 09/03/2022   Amputated left leg (HCC) 09/03/2022   Wound infection after surgery 09/03/2022   Equinus contracture of left ankle 08/15/2022   Peripheral vascular complication 07/21/2022   PVD (peripheral vascular disease) 07/18/2022   Toe ulcer, left, limited to breakdown of skin (HCC) 07/13/2022   S/P BKA (below knee amputation), right (HCC) 05/29/2022   Gangrene of right foot (HCC) 04/25/2022   PAD (peripheral artery disease) 04/25/2022   Other osteoporosis without current pathological fracture 11/10/2021   Hypocalcemia 11/10/2021   Low back pain with left-sided sciatica 10/31/2021   Left ear impacted cerumen 09/18/2021   Morbid obesity (HCC) 03/10/2021   Insomnia 10/28/2020   Memory loss or impairment 09/26/2020   Right leg weakness 02/28/2020   Knee pain, right 10/23/2019   Carpal tunnel syndrome 06/13/2019   Chronic pain syndrome 06/13/2019   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) 06/13/2019   ESRD on hemodialysis (HCC) 11/22/2018   Ischemic heart disease 09/25/2018   HTN (hypertension) 06/12/2018   Adrenal mass, left 11/09/2017   MGUS (monoclonal gammopathy of unknown significance) 11/05/2017   Chronic diastolic (congestive) heart failure (HCC) 09/20/2017   Bilateral leg weakness 09/09/2017   Adrenal adenoma 09/03/2017   Dysphagia 11/05/2016   Irritable bowel syndrome 02/04/2016   Cardiac murmur 01/17/2014   Back pain with left-sided radiculopathy 09/18/2013   Other seasonal allergic rhinitis 08/22/2012   OSA (obstructive sleep apnea) 06/02/2011   Neuropathy 04/16/2010   LUPUS ERYTHEMATOSUS, DISCOID 06/05/2008   Hyperlipidemia 06/07/2007   Obesity 06/07/2007   Type 2 diabetes mellitus  with hyperglycemia (HCC) 06/07/2007   Diabetes mellitus (HCC) 06/07/2007   PCP:  Antonetta Rollene BRAVO, MD Pharmacy:   [facility] - [place], [place] - [address] [place] [address] Phone: [phone] Fax: [phone]     Social Drivers of Health (SDOH) Social History: SDOH Screenings   Food Insecurity: No Food Insecurity (01/17/2024)  Housing: Unknown (01/17/2024)  Transportation Needs: No Transportation Needs (01/17/2024)  Utilities: Not At Risk (01/17/2024)  Alcohol Screen: Low Risk  (05/03/2023)  Depression (PHQ2-9): Low Risk  (12/21/2023)  Recent Concern: Depression (PHQ2-9) - High Risk (11/24/2023)  Financial Resource Strain: Low Risk  (11/24/2023)  Physical Activity: Inactive (11/24/2023)  Social Connections: Socially Integrated (01/17/2024)  Recent Concern: Social Connections - Moderately Isolated (11/25/2023)  Stress: Stress Concern Present (11/24/2023)  Tobacco Use: Low Risk  (01/17/2024)  Health Literacy: Adequate Health Literacy (05/03/2023)  Recent Concern: Health Literacy - Inadequate Health Literacy (02/08/2023)   SDOH Interventions:     Readmission Risk Interventions    01/02/2024   12:17 PM 12/16/2023    3:19 PM 12/02/2023   12:48 PM  Readmission Risk Prevention Plan  Transportation Screening Complete Complete Complete  PCP or Specialist Appt within 3-5 Days   Not Complete  HRI or Home Care Consult   Complete  Social Work Consult for Recovery  Care Planning/Counseling   Complete  Palliative Care Screening   Not Applicable  Medication Review (RN Care Manager) Complete Complete Complete  HRI or Home Care Consult Complete Complete   SW Recovery Care/Counseling Consult Complete Complete   Palliative Care Screening Not Applicable Not Applicable   Skilled Nursing Facility Not Applicable Not Applicable

## 2024-01-18 NOTE — Progress Notes (Signed)
 Gastroenterology Progress Note    Primary Gastroenterologist:  Dr. Cindie  Patient ID: Kathryn Beck; [id number]; 1956/02/04    Subjective   No overt GI bleeding. No abdominal pain, denies N/V. Tolerated breakfast. No concerns currently. Denies any fatigue, weakness.    Objective   Vital signs in last 24 hours Temp:  [97.7 F (36.5 C)-98.3 F (36.8 C)] 98.1 F (36.7 C) (10/28 0554) Pulse Rate:  [64-74] 68 (10/28 0554) Resp:  [12-21] 18 (10/28 0554) BP: (114-162)/(58-99) 151/66 (10/28 0554) SpO2:  [90 %-100 %] 95 % (10/28 0554) Weight:  [65.3 kg] 65.3 kg (10/27 1111) Last BM Date : 01/17/24  Physical Exam General:   Alert and oriented, pleasant Head:  Normocephalic and atraumatic. Eyes:  No icterus, sclera clear. Conjuctiva pink.  Abdomen:  Bowel sounds present, soft, non-tender, non-distended. Extremities:  bilateral BKA Neurologic:  Alert and  oriented x4   Intake/Output from previous day: No intake/output data recorded. Intake/Output this shift: No intake/output data recorded.  Lab Results  Recent Labs    01/17/24 1129 01/18/24 0454  WBC 7.7 7.2  HGB 7.0* 8.6*  HCT 23.3* 28.1*  PLT 325 330   BMET Recent Labs    01/17/24 1129 01/18/24 0454  NA 140 141  K 3.2* 5.1  CL 100 101  CO2 30 28  GLUCOSE 79 85  BUN 16 23  CREATININE 2.00* 3.02*  CALCIUM  8.5* 8.6*   LFT Recent Labs    01/17/24 1129 01/18/24 0454  PROT 6.3*  --   ALBUMIN  3.5 3.4*  AST 25  --   ALT 5  --   ALKPHOS 95  --   BILITOT 0.3  --    PT/INR Recent Labs    01/17/24 1129  LABPROT 14.1  INR 1.0     Assessment  68 y.o. female with a history of  right renal lesion seen in CTA 11/2023, GERD, pancreatitis, moderate aortic stenosis (ECHO 05/2023) afib on apixaban  with cardioversion 10/27/2023, PAD s/p bilateral BKAs, HTN, HLD, Type 2 DM, bilateral adrenal adenoma (followed by Endocrinology), chronic HFpEF, OSA, ESRD (MWF), history of TIA's, history of recurrent chest pain   (cath in 12/2018 showing normal coronary arteries) MSSA infection of left hand s/p I&D in past, acute on chronic anemia with recurrent GI bleeding and requiring blood transfusions, presenting again to the ED with symptomatic anemia and GI now consulted due to melena on exam, heme positive stool, and anemia.   Acute blood loss anemia: Hgb 7 on admission, similar to several days prior on outside labs, receiving 1 unit PRBCs and improvement in Hgb to 8.6. Iron studies in the past consistent with IDA, and most updated ones yesterday with ferritin elevated at 1205 but iron 30, sats 17. Ferritin likely acute reactant and also elevated in light of recent transfusions. Recommend Hematology evaluation and will plan on outpatient EGD with capsule placement, as gastric mucosa not entirely visualized at time of last EGD due to gastric contents. Suspect will do best with capsule deployment due to likely gastroparesis.   GI will sign off and appropriate for discharge home. May resume anticoagulation. Will recheck CBC Thursday as outpatient.     Plan / Recommendations  PPI daily Outpatient hematology referral due to IDA component Appropriate for discharge: we will arrange outpatient EGD with capsule deployment and have modified prep prior to ensure adequate visualization GI signing off: will repeat CBC Thursday. Discussed with Dr. Vicci.     LOS: 0 days    01/18/2024,  9:38 AM  Therisa MICAEL Stager, PhD, ANP-BC [facility]

## 2024-01-18 NOTE — Telephone Encounter (Signed)
 Kathryn Beck/Kathryn Beck:  Please refer to Hematology due to anemia, multifactorial in setting of chronic disease and IDA, hx of transfusion dependent anemia, we are still working her up Please arrange EGD with Dr Cindie as outpatient with capsule deployment. She will need to be on clear liquids for 24 hours prior to the procedure. (Clear liquids all day the day prior). She would also benefit from IV Reglan  10 mg prior to the procedure in pre-op  (at least 30 minutes prior), if we can arrange this with Anitra, RN.  Let me know when this is planned, so I can find a place to read the capsule.    Kathryn Beck: needs office follow-up next available, Cindie or myself. This can be after the procedures.

## 2024-01-19 ENCOUNTER — Other Ambulatory Visit: Payer: Self-pay

## 2024-01-19 DIAGNOSIS — N186 End stage renal disease: Secondary | ICD-10-CM | POA: Diagnosis not present

## 2024-01-19 DIAGNOSIS — I739 Peripheral vascular disease, unspecified: Secondary | ICD-10-CM | POA: Diagnosis not present

## 2024-01-19 DIAGNOSIS — D500 Iron deficiency anemia secondary to blood loss (chronic): Secondary | ICD-10-CM | POA: Diagnosis not present

## 2024-01-19 DIAGNOSIS — I480 Paroxysmal atrial fibrillation: Secondary | ICD-10-CM | POA: Diagnosis not present

## 2024-01-19 LAB — CBC
HCT: 27.2 % — ABNORMAL LOW (ref 36.0–46.0)
Hemoglobin: 8.2 g/dL — ABNORMAL LOW (ref 12.0–15.0)
MCH: 26.5 pg (ref 26.0–34.0)
MCHC: 30.1 g/dL (ref 30.0–36.0)
MCV: 88 fL (ref 80.0–100.0)
Platelets: 316 K/uL (ref 150–400)
RBC: 3.09 MIL/uL — ABNORMAL LOW (ref 3.87–5.11)
RDW: 17.3 % — ABNORMAL HIGH (ref 11.5–15.5)
WBC: 7.7 K/uL (ref 4.0–10.5)
nRBC: 1.7 % — ABNORMAL HIGH (ref 0.0–0.2)

## 2024-01-19 LAB — RENAL FUNCTION PANEL
Albumin: 3.4 g/dL — ABNORMAL LOW (ref 3.5–5.0)
Anion gap: 14 (ref 5–15)
BUN: 46 mg/dL — ABNORMAL HIGH (ref 8–23)
CO2: 26 mmol/L (ref 22–32)
Calcium: 8.6 mg/dL — ABNORMAL LOW (ref 8.9–10.3)
Chloride: 101 mmol/L (ref 98–111)
Creatinine, Ser: 4.76 mg/dL — ABNORMAL HIGH (ref 0.44–1.00)
GFR, Estimated: 9 mL/min — ABNORMAL LOW (ref >60)
Glucose, Bld: 324 mg/dL — ABNORMAL HIGH (ref 70–99)
Phosphorus: 5.5 mg/dL — ABNORMAL HIGH (ref 2.5–4.6)
Potassium: 5.2 mmol/L — ABNORMAL HIGH (ref 3.5–5.1)
Sodium: 141 mmol/L (ref 135–145)

## 2024-01-19 LAB — GLUCOSE, CAPILLARY
Glucose-Capillary: 309 mg/dL — ABNORMAL HIGH (ref 70–99)
Glucose-Capillary: 307 mg/dL — ABNORMAL HIGH (ref 70–99)
Glucose-Capillary: 249 mg/dL — ABNORMAL HIGH (ref 70–99)

## 2024-01-19 LAB — HEPATITIS B SURFACE ANTIBODY, QUANTITATIVE: Hep B S AB Quant (Post): 429 m[IU]/mL

## 2024-01-19 MED ORDER — INSULIN GLARGINE-YFGN 100 UNIT/ML ~~LOC~~ SOLN: 5.0000 [IU] | Freq: Every day | SUBCUTANEOUS | Status: DC

## 2024-01-19 NOTE — Procedures (Signed)
 I was present at this dialysis session. I have reviewed the session itself and made appropriate changes.   Vital signs in last 24 hours:  Temp:  [98.1 F (36.7 C)-98.8 F (37.1 C)] 98.6 F (37 C) (10/29 0952) Pulse Rate:  [68-75] 68 (10/29 0952) Resp:  [18-20] 18 (10/29 0417) BP: (137-166)/(59-81) 166/81 (10/29 0958) SpO2:  [93 %-100 %] 100 % (10/29 0952) Weight:  [65 kg] 65 kg (10/29 0952) Weight change:  Filed Weights   01/17/24 1111 01/19/24 0952  Weight: 65.3 kg 65 kg    Recent Labs  Lab 01/19/24 0429  NA 141  K 5.2*  CL 101  CO2 26  GLUCOSE 324*  BUN 46*  CREATININE 4.76*  CALCIUM  8.6*  PHOS 5.5*    Recent Labs  Lab 01/17/24 1129 01/18/24 0454 01/19/24 0429  WBC 7.7 7.2 7.7  NEUTROABS 6.2  --   --   HGB 7.0* 8.6* 8.2*  HCT 23.3* 28.1* 27.2*  MCV 87.3 87.3 88.0  PLT 325 330 316    Scheduled Meds:  amLODipine   5 mg Oral Daily   apixaban   2.5 mg Oral BID   busPIRone   7.5 mg Oral BID   calcitRIOL   1 mcg Oral Q M,W,F-HD   cinacalcet   90 mg Oral Q M,W,F   ezetimibe   10 mg Oral Daily   insulin  aspart  0-6 Units Subcutaneous TID WC   metoprolol  succinate  50 mg Oral Daily   nitroGLYCERIN   0.2 mg Transdermal Daily   pantoprazole   40 mg Oral QPM   sertraline   75 mg Oral Daily   torsemide   100 mg Oral Q T,Th,S,Su   Continuous Infusions: PRN Meds:.acetaminophen  **OR** acetaminophen , bisacodyl , fentaNYL  (SUBLIMAZE ) injection, hydrALAZINE , oxyCODONE     Dialysis Orders: MWF [facility] 3 hours and 30 minutes  EDW 70.5 kg (below edw here but weighs with lift equipment) 2k/ 2.5 ca bath LUE AVF BF 400 DF 500 Heparin  1000 units loading and then 1000 units an hour (will stop here) Meds: mircera 90 mcg every 2 weeks and last given on 01/10/24     Assessment/Plan:  Recurrent GIB - given 1 unit PRBC with appropriate increase in Hgb.  Eliquis  on hold.  Will stop heparin  with HD as well.  GI to arrange further outpatient workup.  ESRD -  continue with HD  on MWF schedule  Hypertension/volume  -  stable  Anemia  - as above, received micera 90 mcg on 01/10/24.  Per GI, they requested heme onc evaluation as an outpatient.   Metabolic bone disease -  continue with home meds  Nutrition -  renal diet, carb modified  Dry gangrene of left hand/forearm - followed by ortho and may need amputation.  Unclear if she has seen vascular surgery to r/o steal syndrome from her LUE AVF.  She should follow up with [facility] after discharge.  Disposition - for possible discharge today after HD.     Kathryn DELENA Sellar,  MD 01/19/2024, 10:08 AM

## 2024-01-19 NOTE — Progress Notes (Signed)
 Hemodialysis called for report and patient is ready to go for her treatment.

## 2024-01-19 NOTE — Progress Notes (Signed)
 The patient has completed HD treatment without issue and goal met. The patient had hypertension with no c/o.  01/19/24 1330  Vitals  Temp 98.1 F (36.7 C)  Temp Source Oral  BP (!) 163/76  BP Location Right Arm  BP Method Automatic  Patient Position (if appropriate) Lying  Pulse Rate 70  Resp 18  Oxygen  Therapy  SpO2 100 %  O2 Device Room Air  During Treatment Monitoring  Intra-Hemodialysis Comments Tx completed  Post Treatment  Dialyzer Clearance Lightly streaked  Hemodialysis Intake (mL) 0 mL  Liters Processed 80  Fluid Removed (mL) 2000 mL  Tolerated HD Treatment Yes  Post-Hemodialysis Comments goal met.  AVG/AVF Arterial Site Held (minutes) 7 minutes  AVG/AVF Venous Site Held (minutes) 7 minutes  Fistula / Graft Left Upper arm Arteriovenous fistula  Placement Date/Time: 09/02/17 1150   Placed prior to admission: Yes  Orientation: Left  Access Location: Upper arm  Access Type: Arteriovenous fistula  Site Condition No complications  Fistula / Graft Assessment Present;Thrill;Bruit  Status Deaccessed  Needle Size 15  Drainage Description None

## 2024-01-19 NOTE — Discharge Summary (Addendum)
 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  She had a colonoscopy performed 01/01/2024 with Dr. Cindie with findings of diverticulosis, nonbleeding internal hemorrhoids and 4 ulcers in the descending colon and in the transverse colon that were clipped.  She was discharged home in stable condition with a hemoglobin of 7.4.  Hemoglobin has been monitored and dialysis.  She was sent to ER by EMS after dialysis today for hemoglobin of 6.4.  Recheck hemoglobin in the ED 7.0.  Patient is being typed and crossed and transfused 1 unit PRBC and admission requested for observation.  GI was consulted as well and recommended observation. GI was consulted And saw the patient.  They felt the patient was stable for discharge for outpatient EGD and capsule deployment.  The patient did have occasional loose stools without any abdominal pain or leukocytosis.  C. difficile was ordered, but the patient did not produce significant stool for collection.  She was tolerating a diet at the time of discharge.  There were no signs of active blood loss during hospitalization. Nephrology was consulted and the patient underwent dialysis.  Her last dialysis was on 01/19/2024 prior to discharge.  12/03/2023 EGD--normal  esophagus, normal duodenum  12/05/2023 colonoscopy--nonbleeding internal hemorrhoids -Sigmoid diverticulosis -Polyps in the ascending and transverse colon removed  12/17/2023 colonoscopy -Consistent with post polypectomy bleed -Hemoclips placed at the site of polypectomy -Polyps noted in the rectum sigmoid, and descending colon -Nonbleeding internal and external hemorrhoids - Melanosis coli  01/01/2024 colonoscopy -Pandiverticulosis -Blood in the descending and transverse colon -4 ulcers in the descending and transverse colon biopsied -Nonbleeding internal hemorrhoids  Assessment and Plan: Acute blood loss anemia -- Hg up to 8.6 after PRBC transfusion on 10/27 -- she tested stool guaiac positive but no hematochezia or melena -- initially held anticoagulation but now GI says can restart on 10/28 -- Hgb stable after one unit PRBC -- appreciate GI consultation and recommendations -- they are planning outpatient EGD and capsule study   Dry gangrene left hand/forearm -- she is being followed by hand surgeon (Anshul Estela) Agarwala, M.D) and eventually will need amputation -- monitor s/s of systemic infection where in my opinion she would need more urgent surgical management for source control -- she has appt with Dr. Arlinda on 01/25/24 for recheck and decision regarding further management -- continue wrapping it per surgery and wound care recommendations  -- reportedly has been on cefazolin  with outpatient HD MWF per her hand surgeon -- she will also need to follow up [facility], Dr. Serene to r/o steal syndrome --07/21/22-- Stent, left superficial femoral and popliteal artery    Diarrhea -- given she has been on cefazolin  with HD for several months, will check for C diff -- no significant stool to collect   Paroxysmal atrial fibrillation  -- ok to  resume apixaban  per GI  --risks/benefits and alternatives were discussed with patient's daughter --DTR concerned with apixaban  and risk of  bleeding, would prefer to switch to ASA 81 mg daily   Uncontrolled type 2 DM with hyperglycemia and vascular complications -- given ESRD, will treat with SSI coverage (very sensitive)  Hypoglycemia -- mild, treated -- she is not on basal insulin  at this time -- very minimal SSI coverage, stop prandial coverage -- continue frequent CBG monitoring   PAD - severe -- resume home meds if able    ESRD on HD MWF -- pt completed HD treatment 10/27 -- will consult to nephrology on 10/28  -- HD on 10/29 per nephrology team   Essential hypertension -- resume home metoprolol  succinate   OSA -- will offer nightly CPAP therapy in hospital        Consultants: GI, renal Procedures performed: none  Disposition: Home Diet recommendation:  Renal diet DISCHARGE MEDICATION: Allergies as of 01/19/2024       Reactions   Ace Inhibitors Anaphylaxis, Swelling   Penicillins Itching, Swelling   Has tolerated cefazolin  on multiple occasions    Statins Other (See Comments)   Elevated LFT's   Albuterol  Swelling        Medication List     STOP taking these medications    apixaban  2.5 MG Tabs tablet Commonly known as: Eliquis        TAKE these medications    acetaminophen  500 MG tablet Commonly known as: TYLENOL  Take 1,000 mg by mouth every 6 (six) hours as needed for mild pain (pain score 1-3).   amLODipine  10 MG tablet Commonly known as: NORVASC  Take 0.5 tablets (5 mg total) by mouth daily.   Aspirin  Low Dose 81 MG chewable tablet Generic drug: aspirin  Chew 162 mg by mouth daily.   busPIRone  7.5 MG tablet Commonly known as: BUSPAR  Take 1 tablet (7.5 mg total) by mouth 2 (two) times daily.   calcitRIOL  0.5 MCG capsule Commonly known as: ROCALTROL  Take 2 capsules (1 mcg total) by mouth every Monday, Wednesday, and Friday with hemodialysis.   cinacalcet  30 MG tablet Commonly known as: SENSIPAR  Take 3 tablets (90 mg total) by mouth every Monday, Wednesday, and Friday.    ezetimibe  10 MG tablet Commonly known as: ZETIA  Take 1 tablet (10 mg total) by mouth daily.   insulin  aspart 100 UNIT/ML injection Commonly known as: novoLOG  Inject 0-6 Units into the skin 3 (three) times daily with meals. CBG 70 - 120: 0 units  CBG 121 - 150: 0 units  CBG 151 - 200: 1 unit  CBG 201-250: 2 units  CBG 251-300: 3 units  CBG 301-350: 4 units  CBG 351-400: 5 units  CBG > 400: Give 10 units and call MD   metoprolol  succinate 50 MG 24 hr tablet Commonly known as: TOPROL -XL TAKE ONE TABLET BY MOUTH ONCE DAILY WITH FOOD (TAKE AFTER DIALYSIS)   nitroGLYCERIN  0.2 mg/hr patch Commonly known as: NITRODUR - Dosed in mg/24 hr Place 1 patch (0.2 mg total) onto the skin daily.   ondansetron  4 MG tablet Commonly known as: ZOFRAN  Take 4 mg by mouth every 8 (eight) hours as needed for nausea or vomiting.   pantoprazole  40 MG tablet Commonly known as: PROTONIX  Take 1 tablet (40 mg total) by mouth daily.   Rena-Vite Rx 1 MG Tabs Take 1 tablet by mouth daily.   sertraline  50 MG tablet Commonly known as: ZOLOFT  Take 1.5 tablets (75 mg total) by mouth daily. Take  one and a half tablets once daily   torsemide  100 MG tablet Commonly known as: DEMADEX  Take 1 tablet (100 mg total) by mouth every Tuesday, Thursday, Saturday, and Sunday. Non-dialysis days   Toujeo  Max SoloStar 300 UNIT/ML Solostar Pen Generic drug: insulin  glargine (2 Unit Dial ) Inject 30 Units into the skin daily. May need to slowly increase the dose depending upon your blood sugar, follow-up with PCP   [name]  500 MG chewable tablet Generic drug: sucroferric oxyhydroxide Chew 1-2 tablets (500-1,000 mg total) by mouth See admin instructions. Take 1000mg  (2 tablets) by mouth with meals and 500mg  (1 tablet) with snacks        Follow-up Information     [facility]) Follow up.   Specialty: Home Health Services Why: Home health will call to schedule your next home visit. Contact  information: [address] [place]  [address] [phone]        Serene Gaile ORN, MD Follow up in 2 week(s).   Specialties: Vascular Surgery, Cardiology Contact information: [address] [place] [address] [phone]                Discharge Exam: Fredricka Weights   01/17/24 1111 01/19/24 0952 01/19/24 1329  Weight: 65.3 kg 65 kg 63 kg   HEENT:  [place]/AT, No thrush, no icterus CV:  RRR, no rub, no S3, no S4 Lung:  CTA, no wheeze, no rhonchi Abd:  soft/+BS, NT Ext:  No edema, no lymphangitis, no synovitis, no rash   Condition at discharge: stable  The results of significant diagnostics from this hospitalization (including imaging, microbiology, ancillary and laboratory) are listed below for reference.   Imaging Studies: No results found.  Microbiology: Results for orders placed or performed during the hospital encounter of 12/15/23  MRSA Next Gen by PCR, Nasal     Status: None   Collection Time: 12/16/23  9:14 AM   Specimen: Nasal Mucosa; Nasal Swab  Result Value Ref Range Status   MRSA by PCR Next Gen NOT DETECTED NOT DETECTED Final    Comment: (NOTE) The GeneXpert MRSA Assay (FDA approved for NASAL specimens only), is one component of a comprehensive MRSA colonization surveillance program. It is not intended to diagnose MRSA infection nor to guide or monitor treatment for MRSA infections. Test performance is not FDA approved in patients less than 29 years old. Performed at [facility], [address], [place], [place] [address]    *Note: Due to a large number of results and/or encounters for the requested time period, some results have not been displayed. A complete set of results can be found in Results Review.    Labs: CBC: Recent Labs  Lab 01/17/24 1129 01/18/24 0454 01/19/24 0429  WBC 7.7 7.2 7.7  NEUTROABS 6.2  --   --   HGB 7.0* 8.6* 8.2*  HCT 23.3* 28.1* 27.2*  MCV 87.3 87.3 88.0  PLT 325 330 316    Basic Metabolic Panel: Recent Labs  Lab 01/17/24 1129 01/18/24 0454 01/19/24 0429  NA 140 141 141  K 3.2* 5.1 5.2*  CL 100 101 101  CO2 30 28 26   GLUCOSE 79 85 324*  BUN 16 23 46*  CREATININE 2.00* 3.02* 4.76*  CALCIUM  8.5* 8.6* 8.6*  PHOS  --  4.2 5.5*   Liver Function Tests: Recent Labs  Lab 01/17/24 1129 01/18/24 0454 01/19/24 0429  AST 25  --   --   ALT 5  --   --  ALKPHOS 95  --   --   BILITOT 0.3  --   --   PROT 6.3*  --   --   ALBUMIN  3.5 3.4* 3.4*   CBG: Recent Labs  Lab 01/18/24 1124 01/18/24 1601 01/18/24 2052 01/19/24 0304 01/19/24 0733  GLUCAP 121* 151* 309* 307* 249*    Discharge time spent: greater than 30 minutes.  Signed: Alm Schneider, MD [facility] 01/19/2024

## 2024-01-19 NOTE — Plan of Care (Signed)
  Problem: Clinical Measurements: Goal: Diagnostic test results will improve Outcome: Not Progressing   Problem: Clinical Measurements: Goal: Cardiovascular complication will be avoided Outcome: Not Progressing   Problem: Safety: Goal: Ability to remain free from injury will improve Outcome: Not Progressing   Problem: Elimination: Goal: Will not experience complications related to bowel motility Outcome: Not Progressing   Problem: Metabolic: Goal: Ability to maintain appropriate glucose levels will improve Outcome: Not Progressing   Problem: Skin Integrity: Goal: Risk for impaired skin integrity will decrease Outcome: Not Progressing   Problem: Tissue Perfusion: Goal: Adequacy of tissue perfusion will improve Outcome: Not Progressing

## 2024-01-19 NOTE — Progress Notes (Addendum)
 Late note entry 2:53 pm 10/29 D/c noted. Contacted out-pt HD clinic, [facility], and informed of pt d/c and anticipated arrival back to clinic Friday. D/c summ and last neph note faxed at this time. No further support needed.   Anais Denslow Dialysis Nav [phone]

## 2024-01-19 NOTE — Progress Notes (Signed)
 Spoke with daughter about discharge she requested that we call transport to assist getting her home, we will call them once patient is back from dialysis and has her discharge paper work discussed with her.

## 2024-01-20 ENCOUNTER — Telehealth: Payer: Self-pay

## 2024-01-20 DIAGNOSIS — K921 Melena: Secondary | ICD-10-CM

## 2024-01-20 NOTE — Transitions of Care (Post Inpatient/ED Visit) (Signed)
 01/20/2024  Name: Kathryn Beck MRN: [id number] DOB: [birth year]  Today's TOC FU Call Status: Today's TOC FU Call Status:: Successful TOC FU Call Completed TOC FU Call Complete Date: 01/20/24 Patient's Name and Date of Birth confirmed.  Transition Care Management Follow-up Telephone Call Date of Discharge: 01/19/24 [facility] Type of Discharge: Inpatient Admission Primary Inpatient Discharge Diagnosis:: anemia How have you been since you were released from the hospital?: Better Any questions or concerns?: No  Items Reviewed: Did you receive and understand the discharge instructions provided?: Yes Medications obtained,verified, and reconciled?: Yes (Medications Reviewed) Any new allergies since your discharge?: No Dietary orders reviewed?: Yes Do you have support at home?: Yes People in Home [RPT]: child(ren), adult  Medications Reviewed Today: Medications Reviewed Today     Reviewed by Emmitt Pan, LPN (Licensed Practical Nurse) on 01/20/24 at 1434  Med List Status: <None>   Medication Order Taking? Sig Documenting Provider Last Dose Status Informant  0.9 %  sodium chloride  infusion [id number]   Lockamy, Randi L, NP-C  Active   acetaminophen  (TYLENOL ) 500 MG tablet [id number] Yes Take 1,000 mg by mouth every 6 (six) hours as needed for mild pain (pain score 1-3). [provider]  Active Child, Pharmacy Records  amLODipine  (NORVASC ) 10 MG tablet [id number] Yes Take 0.5 tablets (5 mg total) by mouth daily. Pearlean Manus, MD  Active Child, Pharmacy Records           Med Note (WARD, CHUCK KANDICE [place] Nov 10, 2023  7:52 PM)    ASPIRIN  LOW DOSE 81 MG chewable tablet [id number] Yes Chew 162 mg by mouth daily. [provider]  Active Child, Pharmacy Records  B Complex-C-Folic Acid  (RENA-VITE RX) 1 MG TABS [id number] Yes Take 1 tablet by mouth daily. [provider]  Active Child, Pharmacy Records           Med Note WORLEY,  ALASKA S   Thu Nov 19, 2022 11:34 AM)    busPIRone  (BUSPAR ) 7.5 MG tablet [id number] Yes Take 1 tablet (7.5 mg total) by mouth 2 (two) times daily. Antonetta Rollene BRAVO, MD  Active Child, Pharmacy Records  calcitRIOL  (ROCALTROL ) 0.5 MCG capsule [id number] Yes Take 2 capsules (1 mcg total) by mouth every Monday, Wednesday, and Friday with hemodialysis. Caleen Burgess BROCKS, MD  Active Child, Pharmacy Records           Med Note (WARD, CHUCK KANDICE [place] Dec 01, 2023  5:33 PM)    cinacalcet  (SENSIPAR ) 30 MG tablet [id number] Yes Take 3 tablets (90 mg total) by mouth every Monday, Wednesday, and Friday. Vicci Afton LITTIE, MD  Active Child, Pharmacy Records  ezetimibe  (ZETIA ) 10 MG tablet [id number] Yes Take 1 tablet (10 mg total) by mouth daily. Antonetta Rollene BRAVO, MD  Active Child, Pharmacy Records    Discontinued 05/28/11 1302 (Patient has not taken in last 30 days)     Discontinued 05/28/11 1302 (Change in therapy)   insulin  aspart (NOVOLOG ) 100 UNIT/ML injection [id number] Yes Inject 0-6 Units into the skin 3 (three) times daily with meals. CBG 70 - 120: 0 units  CBG 121 - 150: 0 units  CBG 151 - 200: 1 unit  CBG 201-250: 2 units  CBG 251-300: 3 units  CBG 301-350: 4 units  CBG 351-400: 5 units  CBG > 400: Give 10 units and call MD Willette Adriana LABOR, MD  Active Child, Pharmacy Records  insulin  glargine, 2 Unit Dial , (TOUJEO  MAX SOLOSTAR)  300 UNIT/ML Solostar Pen [id number] Yes Inject 30 Units into the skin daily. May need to slowly increase the dose depending upon your blood sugar, follow-up with PCP Trixie File, MD  Active Child, Pharmacy Records           Med Note WORLEY, ALASKA S   Wed Oct 27, 2023  2:44 PM)    metoprolol  succinate (TOPROL -XL) 50 MG 24 hr tablet [id number] Yes TAKE ONE TABLET BY MOUTH ONCE DAILY WITH FOOD (TAKE AFTER DIALYSIS) Antonetta Rollene BRAVO, MD  Active Child, Pharmacy Records  nitroGLYCERIN  (NITRODUR - DOSED IN MG/24 HR) 0.2 mg/hr patch [id number] Yes Place 1 patch (0.2 mg total)  onto the skin daily. Arlinda Buster, MD  Active Child, Pharmacy Records  ondansetron  (ZOFRAN ) 4 MG tablet [id number] Yes Take 4 mg by mouth every 8 (eight) hours as needed for nausea or vomiting. [provider]  Active Child, Pharmacy Records  pantoprazole  (PROTONIX ) 40 MG tablet [id number] Yes Take 1 tablet (40 mg total) by mouth daily. Vicci Afton CROME, MD  Active Child, Pharmacy Records  sertraline  (ZOLOFT ) 50 MG tablet [id number] Yes Take 1.5 tablets (75 mg total) by mouth daily. Take one and a half tablets once daily Vicci Afton CROME, MD  Active Child, Pharmacy Records  torsemide  (DEMADEX ) 100 MG tablet [id number] Yes Take 1 tablet (100 mg total) by mouth every Tuesday, Thursday, Saturday, and Sunday. Non-dialysis days Vicci Afton CROME, MD  Active Child, Pharmacy Records  [name]  500 MG chewable tablet [id number] Yes Chew 1-2 tablets (500-1,000 mg total) by mouth See admin instructions. Take 1000mg  (2 tablets) by mouth with meals and 500mg  (1 tablet) with snacks Antonetta Rollene BRAVO, MD  Active Child, Pharmacy Records           Med Note [place], DAWN S   Thu Dec 16, 2023  2:06 PM) Pt gets meds from Dr.  Suellyn List Note Sydell Heron NOVAK, RN 11/25/23 1342): Patient has dialysis on M-W-F, pt's daughter handles pt's medications 11/25/23 Medications list review with daughter, [name] on call today. Noted changes from PCP HFU visit.             Home Care and Equipment/Supplies: Were Home Health Services Ordered?: Yes Name of Home Health Agency:: [facility] Any new equipment or medical supplies ordered?: NA  Functional Questionnaire: Do you need assistance with bathing/showering or dressing?: Yes Do you need assistance with meal preparation?: Yes Do you need assistance with eating?: No Do you have difficulty maintaining continence: Yes Do you need assistance with getting out of bed/getting out of a chair/moving?: No Do you have difficulty managing or taking your  medications?: Yes  Follow up appointments reviewed: PCP Follow-up appointment confirmed?: No (no avail appts, sent message to staff to schedule) MD Provider Line Number:[phone] Given: No Specialist Hospital Follow-up appointment confirmed?: No Reason Specialist Follow-Up Not Confirmed: Patient has Specialist Provider Number and will Call for Appointment Do you need transportation to your follow-up appointment?: No Do you understand care options if your condition(s) worsen?: Yes-patient verbalized understanding    SIGNATURE Julian Lemmings, LPN [facility] Nurse Health Advisor Direct Dial  [phone]

## 2024-01-20 NOTE — Telephone Encounter (Addendum)
 Referral placed.  Spoke with daughter and Scheduled for EGD/GIVENS 11/17. Aware will send instructions via mychart. Secure message sent to anitra regarding reglan     Per [facility] for both insurances Notification/Precertification Requirement This member's plan does not currently require notification or prior-authorization through the [employer] Notification or Prior-Authorization Program. Please contact a Customer Care Professional at [phone] if you believe the information returned to be in error.

## 2024-01-20 NOTE — Addendum Note (Signed)
 Addended by: JEANELL GRAEME RAMAN on: 01/20/2024 01:32 PM   Modules accepted: Orders

## 2024-01-21 ENCOUNTER — Other Ambulatory Visit: Payer: Self-pay

## 2024-01-24 ENCOUNTER — Other Ambulatory Visit: Payer: Self-pay

## 2024-01-24 ENCOUNTER — Emergency Department (HOSPITAL_COMMUNITY)
Admission: EM | Admit: 2024-01-24 | Discharge: 2024-01-24 | Disposition: A | Discharge status: Home / Self Care | Attending: Emergency Medicine | Admitting: Emergency Medicine

## 2024-01-24 ENCOUNTER — Encounter (HOSPITAL_COMMUNITY): Payer: Self-pay | Admitting: Emergency Medicine

## 2024-01-24 ENCOUNTER — Encounter: Payer: Self-pay | Admitting: Radiology

## 2024-01-24 DIAGNOSIS — E1122 Type 2 diabetes mellitus with diabetic chronic kidney disease: Secondary | ICD-10-CM | POA: Insufficient documentation

## 2024-01-24 DIAGNOSIS — Z79899 Other long term (current) drug therapy: Secondary | ICD-10-CM | POA: Insufficient documentation

## 2024-01-24 DIAGNOSIS — I120 Hypertensive chronic kidney disease with stage 5 chronic kidney disease or end stage renal disease: Secondary | ICD-10-CM | POA: Insufficient documentation

## 2024-01-24 DIAGNOSIS — I96 Gangrene, not elsewhere classified: Secondary | ICD-10-CM | POA: Diagnosis not present

## 2024-01-24 DIAGNOSIS — Z4800 Encounter for change or removal of nonsurgical wound dressing: Secondary | ICD-10-CM | POA: Diagnosis present

## 2024-01-24 DIAGNOSIS — Z794 Long term (current) use of insulin: Secondary | ICD-10-CM | POA: Diagnosis not present

## 2024-01-24 DIAGNOSIS — N186 End stage renal disease: Secondary | ICD-10-CM | POA: Diagnosis not present

## 2024-01-24 MED ORDER — ACETAMINOPHEN 325 MG PO TABS
650.0000 mg | ORAL_TABLET | Freq: Once | ORAL | Status: AC
  Administered 2024-01-24: 650 mg via ORAL
  Filled 2024-01-24: qty 2

## 2024-01-24 NOTE — ED Provider Notes (Signed)
 [place] EMERGENCY DEPARTMENT AT [facility] Provider Note  CSN: [id number] Arrival date & time: 01/24/24 1035  Chief Complaint(s) Wound Infection  HPI Kathryn Beck is a 68 y.o. female with past medical history as below, significant for ESRD Monday Wednesday Friday, DM 2, PAD, hypertension, A-fib on apixaban , BKA, GIB who presents to the ED with complaint of wound check  She was admitted last month and discharged on 10/29 with ABLA, possible GIB, GI saw pt, she was discharged in stable condition, cleared to resume anticoagulation from GI  Patient completed her dialysis this morning, she was directed by nurse practitioner at dialysis clinic to be transported to the ER for evaluation of her left hand wound.  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  She has plan to see him in the office tomorrow.  She has been on Ancef  with her dialysis.  Patient feels her hand hurts slightly worse than normal but does not appear grossly different.  No fevers or chills, nausea or vomiting.  Past Medical History Past Medical History:  Diagnosis Date   Abnormal MRI, cervical spine 03/04/2020   Acid reflux    Acute osteomyelitis of toe of left foot (HCC) 08/13/2022   Amputated left leg (HCC)    bilateral BKA   Anemia    Arthritis    Axillary masses    Soft tissue - status post excision   Back pain    CHF (congestive heart failure) (HCC)    COVID-19 virus infection 04/06/2019   CVA (cerebral vascular accident) (HCC) 12/17/2022   Depression    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  Last  Assessment & Plan:   Multiple polyps with surveillance due in 2021.  IMO SNOMED Dx Update Oct 2024     History of pneumonia 2019   Hypoxia 04/03/2019   Memory loss    Mixed hyperlipidemia    Not currently working due to disabled status 06/13/2018   Formatting of this note might be different from the original.  Last Assessment & Plan:   Formatting of this note might be different from the original.  Reports being  disabled since April/May 2019 , hospitalized then in a niursing home, now on dialysis and is unable to work     Obesity    Pancreatitis    Peritoneal dialysis catheter in place    Pneumonia due to COVID-19 virus 04/02/2019   Sleep apnea    Noncompliant with CPAP   Stroke (HCC)    mini stroke   Type 2 diabetes mellitus (HCC)    Patient Active Problem List   Diagnosis Date Noted   Melena 01/20/2024   Anemia due to chronic blood loss 01/17/2024   S/P bilateral BKA (below knee amputation) (HCC) 01/17/2024   Acquired thrombophilia 01/17/2024   Rectal bleeding 12/29/2023   Acute blood loss anemia 12/29/2023   Lower GI bleed 12/16/2023   Dry Gangrene of left upper extremity 12/06/2023   Acute GI bleeding 12/01/2023   Dry gangrene (HCC) - left hand 10/29/2023  PAF (paroxysmal atrial fibrillation) (HCC) 10/27/2023   Mass of breast 08/30/2023   Depression, major, single episode, moderate (HCC) 08/30/2023   GAD (generalized anxiety disorder) 08/30/2023   Decreased strength, endurance, and mobility 08/30/2023   Gangrene of finger of left hand (HCC) 08/30/2023   Uncontrolled type 2 diabetes mellitus with hyperglycemia (HCC) 08/30/2023   Diarrhea 05/04/2023   Hearing loss 04/27/2023   Otalgia of both ears 04/27/2023   Acute confusion 12/10/2022   Generalized weakness 11/18/2022   Paronychia of finger of right hand 11/13/2022   Dehiscence of amputation stump of left lower extremity (HCC) 10/07/2022   Status post below-knee amputation of left lower extremity (HCC) 10/07/2022    Subacute osteomyelitis, left ankle and foot (HCC) 09/03/2022   Left foot infection 09/03/2022   Amputated left leg (HCC) 09/03/2022   Wound infection after surgery 09/03/2022   Equinus contracture of left ankle 08/15/2022   Peripheral vascular complication 07/21/2022   PVD (peripheral vascular disease) 07/18/2022   Toe ulcer, left, limited to breakdown of skin (HCC) 07/13/2022   S/P BKA (below knee amputation), right (HCC) 05/29/2022   Gangrene of right foot (HCC) 04/25/2022   PAD (peripheral artery disease) 04/25/2022   Other osteoporosis without current pathological fracture 11/10/2021   Hypocalcemia 11/10/2021   Low back pain with left-sided sciatica 10/31/2021   Left ear impacted cerumen 09/18/2021   Morbid obesity (HCC) 03/10/2021   Insomnia 10/28/2020   Memory loss or impairment 09/26/2020   Right leg weakness 02/28/2020   Knee pain, right 10/23/2019   Carpal tunnel syndrome 06/13/2019   Chronic pain syndrome 06/13/2019   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) 06/13/2019   ESRD on hemodialysis (HCC) 11/22/2018   Ischemic heart disease 09/25/2018   HTN (hypertension) 06/12/2018   Adrenal mass, left 11/09/2017   MGUS (monoclonal gammopathy of unknown significance) 11/05/2017   Chronic diastolic (congestive) heart failure (HCC) 09/20/2017   Bilateral leg weakness 09/09/2017   Adrenal adenoma 09/03/2017   Dysphagia 11/05/2016   Irritable bowel syndrome 02/04/2016   Cardiac murmur 01/17/2014   Back pain with left-sided radiculopathy 09/18/2013   Other seasonal allergic rhinitis 08/22/2012   OSA (obstructive sleep apnea) 06/02/2011   Neuropathy 04/16/2010   LUPUS ERYTHEMATOSUS, DISCOID 06/05/2008   Hyperlipidemia 06/07/2007   Obesity 06/07/2007   Type 2 diabetes mellitus with hyperglycemia (HCC) 06/07/2007   Diabetes mellitus (HCC) 06/07/2007   Home Medication(s) Prior to Admission medications   Medication Sig Start Date End Date Taking? Authorizing  Provider  acetaminophen  (TYLENOL ) 500 MG tablet Take 1,000 mg by mouth every 6 (six) hours as needed for mild pain (pain score 1-3).    [provider]  amLODipine  (NORVASC ) 10 MG tablet Take 0.5 tablets (5 mg total) by mouth daily. 10/28/23   Pearlean Manus, MD  ASPIRIN  LOW DOSE 81 MG chewable tablet Chew 162 mg by mouth daily. 01/05/24   [provider]  B Complex-C-Folic Acid  (RENA-VITE RX) 1 MG TABS Take 1 tablet by mouth daily. 04/15/22   [provider]  busPIRone  (BUSPAR ) 7.5 MG tablet Take 1 tablet (7.5 mg total) by mouth 2 (two) times daily. 11/24/23   Antonetta Rollene BRAVO, MD  calcitRIOL  (ROCALTROL ) 0.5 MCG capsule Take 2 capsules (1 mcg total) by mouth every Monday, Wednesday, and Friday with hemodialysis. 11/05/23   Caleen Burgess BROCKS, MD  cinacalcet  (SENSIPAR ) 30 MG tablet Take 3 tablets (90 mg total) by mouth every Monday, Wednesday, and Friday. 12/08/23   Johnson, Clanford L,  MD  ezetimibe  (ZETIA ) 10 MG tablet Take 1 tablet (10 mg total) by mouth daily. 04/27/23   Antonetta Rollene BRAVO, MD  insulin  aspart (NOVOLOG ) 100 UNIT/ML injection Inject 0-6 Units into the skin 3 (three) times daily with meals. CBG 70 - 120: 0 units  CBG 121 - 150: 0 units  CBG 151 - 200: 1 unit  CBG 201-250: 2 units  CBG 251-300: 3 units  CBG 301-350: 4 units  CBG 351-400: 5 units  CBG > 400: Give 10 units and call MD 11/12/23   Shahmehdi, Seyed A, MD  insulin  glargine, 2 Unit Dial , (TOUJEO  MAX SOLOSTAR) 300 UNIT/ML Solostar Pen Inject 30 Units into the skin daily. May need to slowly increase the dose depending upon your blood sugar, follow-up with PCP 01/01/23   Trixie File, MD  metoprolol  succinate (TOPROL -XL) 50 MG 24 hr tablet TAKE ONE TABLET BY MOUTH ONCE DAILY WITH FOOD (TAKE AFTER DIALYSIS) 12/20/23   Antonetta Rollene BRAVO, MD  nitroGLYCERIN  (NITRODUR - DOSED IN MG/24 HR) 0.2 mg/hr patch Place 1 patch (0.2 mg total) onto the skin daily. 12/28/23   Agarwala, Anshul, MD  ondansetron   (ZOFRAN ) 4 MG tablet Take 4 mg by mouth every 8 (eight) hours as needed for nausea or vomiting. 11/26/23   [provider]  pantoprazole  (PROTONIX ) 40 MG tablet Take 1 tablet (40 mg total) by mouth daily. 12/08/23   Johnson, Clanford L, MD  sertraline  (ZOLOFT ) 50 MG tablet Take 1.5 tablets (75 mg total) by mouth daily. Take one and a half tablets once daily 01/02/24   Johnson, Clanford L, MD  torsemide  (DEMADEX ) 100 MG tablet Take 1 tablet (100 mg total) by mouth every Tuesday, Thursday, Saturday, and Sunday. Non-dialysis days 01/02/24   Vicci Afton CROME, MD  [name]  500 MG chewable tablet Chew 1-2 tablets (500-1,000 mg total) by mouth See admin instructions. Take 1000mg  (2 tablets) by mouth with meals and 500mg  (1 tablet) with snacks 08/23/23   Antonetta Rollene BRAVO, MD  FLUoxetine (PROZAC) 10 MG capsule Take 10 mg by mouth daily.    05/28/11  [provider]  glipiZIDE  (GLUCOTROL ) 10 MG tablet Take 10 mg by mouth 2 (two) times daily before a meal.    05/28/11  [provider]                                                                                                                                    Past Surgical History Past Surgical History:  Procedure Laterality Date   ABDOMINAL AORTOGRAM W/LOWER EXTREMITY N/A 04/30/2022   Procedure: ABDOMINAL AORTOGRAM W/LOWER EXTREMITY;  Surgeon: Magda Debby SAILOR, MD;  Location: [facility];  Service: Cardiovascular;  Laterality: N/A;   ABDOMINAL AORTOGRAM W/LOWER EXTREMITY N/A 07/21/2022   Procedure: ABDOMINAL AORTOGRAM W/LOWER EXTREMITY;  Surgeon: Serene Gaile ORN, MD;  Location: [facility];  Service: Cardiovascular;  Laterality: N/A;   ABDOMINAL HYSTERECTOMY  ACHILLES TENDON LENGTHENING  08/15/2022   Procedure: ACHILLES TENDON LENGTHENING;  Surgeon: Silva Juliene SAUNDERS, DPM;  Location: [facility] OR;  Service: Podiatry;;   AMPUTATION Right 05/29/2022   Procedure: RIGHT BELOW THE KNEE AMPUTATION;  Surgeon: Harden Jerona GAILS, MD;   Location: [facility] OR;  Service: Orthopedics;  Laterality: Right;   AMPUTATION Left 09/04/2022   Procedure: AMPUTATION FOOT, serial irrigation;  Surgeon: Joya Stabs, DPM;  Location: [facility] OR;  Service: Podiatry;  Laterality: Left;  Surgical team to do block   AMPUTATION Left 10/07/2022   Procedure: LEFT BELOW KNEE AMPUTATION;  Surgeon: Harden Jerona GAILS, MD;  Location: [facility] OR;  Service: Orthopedics;  Laterality: Left;   AMPUTATION FINGER Left 09/29/2023   Procedure: AMPUTATION, FINGER;  Surgeon: Harden Jerona GAILS, MD;  Location: [facility] OR;  Service: Orthopedics;  Laterality: Left;  LEFT HAND LONG FINGER RAY AMPUTATION   AV FISTULA PLACEMENT Left 09/02/2017   Procedure: creation of left arm ARTERIOVENOUS (AV) FISTULA;  Surgeon: Serene Gaile ORN, MD;  Location: [facility] OR;  Service: Vascular;  Laterality: Left;   COLONOSCOPY  2008   Dr. Harvey: normal    COLONOSCOPY N/A 12/18/2016   Dr. Harvey: multiple tubular adenomas, internal hemorrhoids. Surveillance in 3 years    COLONOSCOPY N/A 12/05/2023   Procedure: COLONOSCOPY;  Surgeon: Cindie Carlin POUR, DO;  Location: [facility];  Service: Endoscopy;  Laterality: N/A;   COLONOSCOPY N/A 12/17/2023   Procedure: COLONOSCOPY;  Surgeon: Shaaron Lamar HERO, MD;  [facility]: [facility];  Service: Endoscopy;  Laterality: N/A;   COLONOSCOPY N/A 01/01/2024   Procedure: COLONOSCOPY;  Surgeon: Cindie Carlin POUR, DO;  [facility]: [facility];  Service: Endoscopy;  Laterality: N/A;   ESOPHAGEAL DILATION N/A 10/13/2015   Procedure: ESOPHAGEAL DILATION;  Surgeon: Claudis RAYMOND Rivet, MD;  [facility];  Service: Endoscopy;  Laterality: N/A;   ESOPHAGEAL DILATION N/A 12/03/2023   Procedure: DILATION, ESOPHAGUS;  Surgeon: Cindie Carlin POUR, DO;  [facility];  Service: Endoscopy;  Laterality: N/A;   ESOPHAGOGASTRODUODENOSCOPY N/A 10/13/2015   Dr. Rivet: chronic gastritis on path, no H.pylori. Empiric dilation    ESOPHAGOGASTRODUODENOSCOPY N/A 12/18/2016   Dr. Harvey: mild  gastritis. BRAVO study revealed uncontrolled GERD. Dysphagia secondary to uncontrolled reflux   ESOPHAGOGASTRODUODENOSCOPY N/A 12/03/2023   Procedure: EGD (ESOPHAGOGASTRODUODENOSCOPY);  Surgeon: Cindie Carlin POUR, DO;  [facility];  Service: Endoscopy;  Laterality: N/A;   FOOT SURGERY Bilateral    nerve     INCISION AND DRAINAGE OF WOUND Left 10/30/2023   Procedure: IRRIGATION AND DEBRIDEMENT WOUND;  Surgeon: Arlinda Buster, MD;  Location: [facility] OR;  Service: Orthopedics;  Laterality: Left;  LEFT HAND WOUND   LEFT HEART CATH AND CORONARY ANGIOGRAPHY N/A 12/29/2018   Procedure: LEFT HEART CATH AND CORONARY ANGIOGRAPHY;  Surgeon: Dann Candyce RAMAN, MD;  Location: [facility] INVASIVE CV LAB;  Service: Cardiovascular;  Laterality: N/A;   LOWER EXTREMITY ANGIOGRAPHY Right 05/04/2022   Procedure: Lower Extremity Angiography;  Surgeon: Lanis Fonda BRAVO, MD;  Location: [facility] INVASIVE CV LAB;  Service: Cardiovascular;  Laterality: Right;   LUNG BIOPSY     MASS EXCISION Right 01/09/2013   Procedure: EXCISION OF NEOPLASM OF RIGHT  AXILLA  AND EXCISION OF NEOPLASM OF LEFT AXILLA;  Surgeon: Oneil DELENA Budge, MD;  Location: [facility] ORS;  Service: General;  Laterality: Right;  procedure end @ 08:23   MYRINGOTOMY WITH TUBE PLACEMENT Bilateral 04/28/2017   Procedure: BILATERAL MYRINGOTOMY WITH TUBE PLACEMENT;  Surgeon: Karis Clunes, MD;  Location: [facility] OR;  Service: ENT;  Laterality: Bilateral;   PERIPHERAL VASCULAR BALLOON ANGIOPLASTY Right 05/04/2022   Procedure: PERIPHERAL VASCULAR BALLOON ANGIOPLASTY;  Surgeon: Lanis Fonda BRAVO, MD;  Location: [facility] INVASIVE CV LAB;  Service: Cardiovascular;  Laterality: Right;  PT   PERIPHERAL VASCULAR INTERVENTION Right 05/04/2022   Procedure: PERIPHERAL VASCULAR INTERVENTION;  Surgeon: Lanis Fonda BRAVO, MD;  Location: [facility] INVASIVE CV LAB;  Service: Cardiovascular;  Laterality: Right;  SFA   PERIPHERAL VASCULAR INTERVENTION Left 07/21/2022   Procedure: PERIPHERAL VASCULAR INTERVENTION;   Surgeon: Serene Gaile ORN, MD;  [facility]: [facility];  Service: Cardiovascular;  Laterality: Left;   REVISION OF ARTERIOVENOUS GORETEX GRAFT Left 05/04/2018   Procedure: TRANSPOSITION OF CEPHALIC VEIN ARTERIOVENOUS FISTULA LEFT ARM;  Surgeon: Oris Krystal FALCON, MD;  Location: [facility] OR;  Service: Vascular;  Laterality: Left;   SAVORY DILATION N/A 12/18/2016   Procedure: SAVORY DILATION;  Surgeon: Harvey Margo CROME, MD;  [facility];  Service: Endoscopy;  Laterality: N/A;   TRANSMETATARSAL AMPUTATION Left 08/15/2022   Procedure: TRANSMETATARSAL AMPUTATION;  Surgeon: Silva Juliene SAUNDERS, DPM;  Location: [facility] OR;  Service: Podiatry;  Laterality: Left;   Family History Family History  Problem Relation Age of Onset   Hypertension Father    Hypercholesterolemia Father    Arthritis Father    Hypertension Sister    Hypercholesterolemia Sister    Breast cancer Sister    Hypertension Sister    Colon cancer Neg Hx    Colon polyps Neg Hx     Social History Social History   Tobacco Use   Smoking status: Never    Passive exposure: Never   Smokeless tobacco: Never   Tobacco comments:    Verified by Daughter, [name]  Vaping Use   Vaping status: Never Used  Substance Use Topics   Alcohol use: No   Drug use: No   Allergies Ace inhibitors, Penicillins, Statins, and Albuterol   Review of Systems A thorough review of systems was obtained and all systems are negative except as noted in the HPI and PMH.   Physical Exam Vital Signs  I have reviewed the triage vital signs BP (!) 160/76 (BP Location: Right Arm)   Pulse 89   Temp 98.2 F (36.8 C) (Oral)   Resp 20   Ht 5' 6 (1.676 m)   SpO2 94%   BMI 22.42 kg/m  Physical Exam Vitals and nursing note reviewed.  Constitutional:      General: She is not in acute distress.    Appearance: Normal appearance. She is well-developed. She is not ill-appearing.  HENT:     Head: Normocephalic and atraumatic.     Right Ear: External  ear normal.     Left Ear: External ear normal.     Nose: Nose normal.     Mouth/Throat:     Mouth: Mucous membranes are moist.  Eyes:     General: No scleral icterus.       Right eye: No discharge.        Left eye: No discharge.  Cardiovascular:     Rate and Rhythm: Normal rate.  Pulmonary:     Effort: Pulmonary effort is normal. No respiratory distress.     Breath sounds: No stridor.  Abdominal:     General: Abdomen is flat. There is no distension.     Tenderness: There is no guarding.  Musculoskeletal:        General: No deformity.     Cervical back:  No rigidity.  Skin:    General: Skin is warm and dry.     Coloration: Skin is not cyanotic, jaundiced or pale.      Neurological:     Mental Status: She is alert.  Psychiatric:        Speech: Speech normal.        Behavior: Behavior normal. Behavior is cooperative.            ED Results and Treatments Labs (all labs ordered are listed, but only abnormal results are displayed) Labs Reviewed - No data to display                                                                                                                        Radiology No results found.  Pertinent labs & imaging results that were available during my care of the patient were reviewed by me and considered in my medical decision making (see MDM for details).  Medications Ordered in ED Medications  acetaminophen  (TYLENOL ) tablet 650 mg (650 mg Oral Given 01/24/24 1237)                                                                                                                                     Procedures Procedures  (including critical care time)  Medical Decision Making / ED Course    Medical Decision Making:    LA SHEHAN is a 68 y.o. female with past medical history as below, significant for ESRD Monday Wednesday Friday, DM 2, PAD, hypertension, A-fib on apixaban , BKA, GIB who presents to the ED with complaint of wound check.  The complaint involves an extensive differential diagnosis and also carries with it a high risk of complications and morbidity.  Serious etiology was considered. Ddx includes but is not limited to: Dry gangrene, wet gangrene, cellulitis, pain from chronic wound, etc.  Complete initial physical exam performed, notably the patient was in no acute distress.    Reviewed and confirmed nursing documentation for past medical history, family history, social history.  Vital signs reviewed.    Dry gangrene left hand> - Wound appears very similar when she was hospitalized here recently, see photos - Will discuss w/ ortho, patient follows with Dr Gildardo Estela) Agarwala (hand surg/ [facility]) - Spoke with orthopedics, recommend follow-up in the office tomorrow. - Patient reports she is feeling better, he denies any fevers or vomiting.  Wound  appears unchanged from prior  1:06 PM:  I have discussed the diagnosis/risks/treatment options with the patient.  Evaluation and diagnostic testing in the emergency department does not suggest an emergent condition requiring admission or immediate intervention beyond what has been performed at this time.  They will follow up with ortho tomorrow. We also discussed returning to the ED immediately if new or worsening sx occur. We discussed the sx which are most concerning (e.g., sudden worsening pain, fever, inability to tolerate by mouth , worsening arm pain/wound) that necessitate immediate return.    The patient appears reasonably screened and/or stabilized for discharge and I doubt any other medical condition or other [facility] requiring further screening, evaluation, or treatment in the ED at this time prior to discharge.                     Additional history obtained: -Additional history obtained from na -External records from outside source obtained and reviewed including: Chart review including previous notes, labs, imaging, consultation notes including   Recent admit Prior media   Lab Tests: na  EKG   EKG Interpretation Date/Time:    Ventricular Rate:    PR Interval:    QRS Duration:    QT Interval:    QTC Calculation:   R Axis:      Text Interpretation:           Imaging Studies ordered: na   Medicines ordered and prescription drug management: Meds ordered this encounter  Medications   acetaminophen  (TYLENOL ) tablet 650 mg    -I have reviewed the patients home medicines and have made adjustments as needed   Consultations Obtained: I requested consultation with the ortho as above,  and discussed lab and imaging findings as well as pertinent plan - they recommend: f/u in office    Cardiac Monitoring: Continuous pulse oximetry interpreted by myself, 97% on RA.    Social Determinants of Health:  Diagnosis or treatment significantly limited by social determinants of health: ESRD on HD   Reevaluation: After the interventions noted above, I reevaluated the patient and found that they have improved  Co morbidities that complicate the patient evaluation  Past Medical History:  Diagnosis Date   Abnormal MRI, cervical spine 03/04/2020   Acid reflux    Acute osteomyelitis of toe of left foot (HCC) 08/13/2022   Amputated left leg (HCC)    bilateral BKA   Anemia    Arthritis    Axillary masses    Soft tissue - status post excision   Back pain    CHF (congestive heart failure) (HCC)    COVID-19 virus infection 04/06/2019   CVA (cerebral vascular accident) (HCC) 12/17/2022   Depression    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  Last Assessment & Plan:    Multiple polyps with surveillance due in 2021.  IMO SNOMED Dx Update Oct 2024     History of pneumonia 2019   Hypoxia 04/03/2019   Memory loss    Mixed hyperlipidemia    Not currently working due to disabled status 06/13/2018  Formatting of this note might be different from the original.  Last Assessment & Plan:   Formatting of this note might be different from the original.  Reports being  disabled since April/May 2019 , hospitalized then in a niursing home, now on dialysis and is unable to work     Obesity    Pancreatitis    Peritoneal dialysis catheter in place    Pneumonia due to COVID-19 virus 04/02/2019   Sleep apnea    Noncompliant with CPAP   Stroke (HCC)    mini stroke   Type 2 diabetes mellitus (HCC)       Dispostion: Disposition decision including need for hospitalization was considered, and patient discharged from emergency department.    Final Clinical Impression(s) / ED Diagnoses Final diagnoses:  Dry gangrene (HCC)        Elnor Jayson LABOR, DO 01/24/24 1307

## 2024-01-24 NOTE — Discharge Instructions (Addendum)
 It was a pleasure caring for you today in the emergency department.  Please follow up with orthopedics tomorrow in the office  Continue daily dressing changes at home  Please return to the emergency department for any worsening or worrisome symptoms.

## 2024-01-24 NOTE — ED Triage Notes (Signed)
 Pt bib [facility] from dialysis. NP at [facility] insisted that pt be brought to the ED immediatley to have gangrene wound on right hand and fingers assessed. PT has a scheduled appt today at [facility] to have this issue addressed. PT and EMS attempted to inform provider of this. Provider preferred that pt be seen at the ED. Pt was able to complete dialysis today. Pt states pain in her hand is worse than normal, however wound is on arm with fistula and fistula is clamped with a dialysis clamp at this time. Odor from wound is very strong

## 2024-01-24 NOTE — ED Notes (Signed)
 Wound care provided. Dressing placed.

## 2024-01-24 NOTE — ED Notes (Signed)
 Daughter notified pt has left with EMS upon her request

## 2024-01-25 ENCOUNTER — Ambulatory Visit: Admitting: Orthopedic Surgery

## 2024-01-25 ENCOUNTER — Ambulatory Visit (INDEPENDENT_AMBULATORY_CARE_PROVIDER_SITE_OTHER): Admitting: Otolaryngology

## 2024-01-25 ENCOUNTER — Encounter (INDEPENDENT_AMBULATORY_CARE_PROVIDER_SITE_OTHER): Payer: Self-pay

## 2024-01-25 DIAGNOSIS — I96 Gangrene, not elsewhere classified: Secondary | ICD-10-CM | POA: Diagnosis not present

## 2024-01-25 NOTE — Progress Notes (Signed)
   [name] - 68 y.o. female MRN [id number]  Date of birth: [birth year]  Office Visit Note: Visit Date: 01/25/2024 PCP: Antonetta Rollene BRAVO, MD Referred by: Antonetta Rollene BRAVO, MD  Subjective:  HPI: Kathryn Beck is a 68 y.o. female who presents today for follow up 12 weeks status post left hand and wrist irrigation and debridement for ongoing infection in the setting of prior ray resection for gangrenous digit.  Has been doing dressing changes once to twice a day as instructed with Vashe.  No significant active drainage, no significant systemic symptoms.  Is getting IV antibiotics regularly with dialysis.  Pertinent ROS were reviewed with the patient and found to be negative unless otherwise specified above in HPI.   Assessment & Plan: Visit Diagnoses:  1. 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  The ring finger dry gangrene is progressing to ongoing necrosis, the small finger is also likely following suit.  Fortunately, the thumb and index finger demonstrate appropriate color viability on examination today which is promising.  We may be able to preserve these digits and perform amputation of the ulnar aspect of the right hand in the future.  The distal forearm has improved drastically from a healing standpoint which is great.  Continue with Nitro patches as as well in order to help stimulate blood flow.  Follow-up in 4 to 6 weeks for wound recheck.  Once again, return precautions were discussed.  Follow-up: No follow-ups on file.   Meds & Orders:  No orders of the defined types were placed in this encounter.  No orders of the defined types were placed in this encounter.    Procedures: No procedures performed       Objective:   Vital Signs: There were no vitals taken for this visit.  Ortho Exam Left upper extremity: -  longitudinal incisions to the  dorsal aspect of the hand and wrist demonstrating slow healing, mild drainage is expressible in these regions, no significant erythema - Appropriate color and viability to the thumb and index finger as well as the distal forearm - Ring finger with notable discoloration and necrotic tissue diffusely back to the MCP level - There is viability to the distal aspect of the small finger, dry gangrenous necrotic tissue is seen at the P1 region  Images in the media tab  Imaging: No results found.   Jenetta Wease Afton Alderton, M.D. [facility], Hand Surgery

## 2024-01-27 ENCOUNTER — Encounter: Payer: Self-pay | Admitting: Neurology

## 2024-01-27 ENCOUNTER — Ambulatory Visit: Admitting: Neurology

## 2024-01-27 VITALS — BP 159/75 | HR 71

## 2024-01-27 DIAGNOSIS — G3184 Mild cognitive impairment, so stated: Secondary | ICD-10-CM | POA: Diagnosis not present

## 2024-01-27 DIAGNOSIS — G5603 Carpal tunnel syndrome, bilateral upper limbs: Secondary | ICD-10-CM | POA: Diagnosis not present

## 2024-01-27 DIAGNOSIS — Z992 Dependence on renal dialysis: Secondary | ICD-10-CM

## 2024-01-27 DIAGNOSIS — N186 End stage renal disease: Secondary | ICD-10-CM

## 2024-01-27 DIAGNOSIS — I63512 Cerebral infarction due to unspecified occlusion or stenosis of left middle cerebral artery: Secondary | ICD-10-CM

## 2024-01-27 DIAGNOSIS — I96 Gangrene, not elsewhere classified: Secondary | ICD-10-CM

## 2024-01-27 NOTE — Progress Notes (Signed)
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 12/17/2022: 1. Motion degraded exam. 2. Chronic left MCA distribution infarct involving the left parieto-occipital region, corresponding with abnormality on prior CT. 3. No other acute intracranial abnormality. 4. Underlying mild chronic microvascular ischemic disease.   MRA HEAD IMPRESSION 12/17/2022: 1. Motion degraded exam. 2. Negative intracranial MRA for large vessel occlusion. No other visible acute abnormality on this motion degraded exam. 3. Fetal type origin of the PCAs with overall diminutive vertebrobasilar system.   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 Right-sided Ulnar neuropathy which could not be localized on this exam. 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 year old female with multiple medical conditions  including hypertension, hyperlipidemia, diabetes, heart disease, end-stage renal disease on dialysis, peripheral vascular disease status post bilateral BKA, bilateral carpal tunnel syndrome who is presenting for follow up.     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 The condition is slowly improving but remains a concern due to potential for further deterioration. The hand surgeon is monitoring. - Continue daily cleaning and wrapping of the affected area - Monitor for signs of infection or worsening condition - Consult with hand surgeon as needed  Cognitive impairment due to medical conditions.  Her ATN profile was completed and was negative for presence of Alzheimer's biomarkers Cognitive impairment likely secondary to poorly controlled diabetes, chronic kidney disease , and dialysis, IV antibiotic treatment. Memory issues include difficulty recalling recent events and conversations, but she remains functionally independent. Previous Alzheimer's markers were negative. Current medications do not appear to be contributing to memory issues. Aricept was considered but not initiated due to potential side effects and existing medical conditions. - Continue to monitor cognitive function and memory issues - Will discuss potential use of Aricept if cognitive impairment worsens, considering side effects such as diarrhea, dizziness, and vivid dreams  Carpal tunnel syndrome, bilateral upper limbs Bilateral carpal tunnel syndrome with the left hand being more affected. Surgery has been delayed due to current hand condition and gangrene. The condition is being managed conservatively until the hand condition improves. - Continue conservative management until hand condition improves - Will consider surgical intervention for  carpal tunnel release once hand condition  stabilizes  Chronic kidney disease on dialysis Chronic kidney disease managed with dialysis. Dialysis may contribute to cognitive impairment and blood pressure fluctuations. There is a need to discuss the maintenance of the dialysis port with the nephrologist. - Discuss dialysis port maintenance with nephrologist - Monitor blood pressure and cognitive function    1. Mild cognitive impairment   2. Bilateral carpal tunnel syndrome   3. Cerebrovascular accident (CVA) due to occlusion of left middle cerebral artery (HCC)   4. ESRD on hemodialysis (HCC)   5. Gangrene of finger of left hand [facility])      Patient Instructions  Continue current medications Continue follow-up with your doctors Return as needed   No orders of the defined types were placed in this encounter.   No orders of the defined types were placed in this encounter.   Return if symptoms worsen or fail to improve.   Pastor Falling, MD 01/27/2024, 10:22 AM  [facility] [address], [address] [place], [place] [address] [phone]

## 2024-01-27 NOTE — Patient Instructions (Signed)
 Continue current medications Continue follow-up with your doctors Return as needed

## 2024-02-01 ENCOUNTER — Encounter (HOSPITAL_COMMUNITY): Payer: Self-pay | Admitting: Emergency Medicine

## 2024-02-01 ENCOUNTER — Other Ambulatory Visit: Payer: Self-pay

## 2024-02-01 ENCOUNTER — Inpatient Hospital Stay (HOSPITAL_COMMUNITY)
Admission: EM | Admit: 2024-02-01 | Discharge: 2024-02-06 | DRG: 252 | Disposition: A | Discharge status: Home Health | Attending: Internal Medicine | Admitting: Internal Medicine

## 2024-02-01 ENCOUNTER — Emergency Department (HOSPITAL_COMMUNITY)

## 2024-02-01 ENCOUNTER — Inpatient Hospital Stay: Admitting: Family Medicine

## 2024-02-01 DIAGNOSIS — Z794 Long term (current) use of insulin: Secondary | ICD-10-CM

## 2024-02-01 DIAGNOSIS — I739 Peripheral vascular disease, unspecified: Secondary | ICD-10-CM | POA: Diagnosis present

## 2024-02-01 DIAGNOSIS — F322 Major depressive disorder, single episode, severe without psychotic features: Secondary | ICD-10-CM | POA: Diagnosis present

## 2024-02-01 DIAGNOSIS — Z8249 Family history of ischemic heart disease and other diseases of the circulatory system: Secondary | ICD-10-CM

## 2024-02-01 DIAGNOSIS — Z8673 Personal history of transient ischemic attack (TIA), and cerebral infarction without residual deficits: Secondary | ICD-10-CM

## 2024-02-01 DIAGNOSIS — E782 Mixed hyperlipidemia: Secondary | ICD-10-CM | POA: Diagnosis present

## 2024-02-01 DIAGNOSIS — Z7982 Long term (current) use of aspirin: Secondary | ICD-10-CM

## 2024-02-01 DIAGNOSIS — Z8616 Personal history of COVID-19: Secondary | ICD-10-CM

## 2024-02-01 DIAGNOSIS — I5032 Chronic diastolic (congestive) heart failure: Secondary | ICD-10-CM | POA: Diagnosis present

## 2024-02-01 DIAGNOSIS — Z8719 Personal history of other diseases of the digestive system: Secondary | ICD-10-CM

## 2024-02-01 DIAGNOSIS — I96 Gangrene, not elsewhere classified: Secondary | ICD-10-CM | POA: Diagnosis not present

## 2024-02-01 DIAGNOSIS — I480 Paroxysmal atrial fibrillation: Secondary | ICD-10-CM | POA: Diagnosis present

## 2024-02-01 DIAGNOSIS — E1122 Type 2 diabetes mellitus with diabetic chronic kidney disease: Secondary | ICD-10-CM | POA: Diagnosis present

## 2024-02-01 DIAGNOSIS — G4733 Obstructive sleep apnea (adult) (pediatric): Secondary | ICD-10-CM | POA: Diagnosis present

## 2024-02-01 DIAGNOSIS — Z8701 Personal history of pneumonia (recurrent): Secondary | ICD-10-CM

## 2024-02-01 DIAGNOSIS — F40240 Claustrophobia: Secondary | ICD-10-CM | POA: Diagnosis present

## 2024-02-01 DIAGNOSIS — E119 Type 2 diabetes mellitus without complications: Secondary | ICD-10-CM

## 2024-02-01 DIAGNOSIS — F32A Depression, unspecified: Secondary | ICD-10-CM | POA: Diagnosis present

## 2024-02-01 DIAGNOSIS — K219 Gastro-esophageal reflux disease without esophagitis: Secondary | ICD-10-CM | POA: Diagnosis present

## 2024-02-01 DIAGNOSIS — Z7984 Long term (current) use of oral hypoglycemic drugs: Secondary | ICD-10-CM

## 2024-02-01 DIAGNOSIS — L089 Local infection of the skin and subcutaneous tissue, unspecified: Principal | ICD-10-CM | POA: Diagnosis present

## 2024-02-01 DIAGNOSIS — E875 Hyperkalemia: Secondary | ICD-10-CM | POA: Diagnosis present

## 2024-02-01 DIAGNOSIS — N186 End stage renal disease: Secondary | ICD-10-CM | POA: Diagnosis present

## 2024-02-01 DIAGNOSIS — M1990 Unspecified osteoarthritis, unspecified site: Secondary | ICD-10-CM | POA: Diagnosis present

## 2024-02-01 DIAGNOSIS — N2581 Secondary hyperparathyroidism of renal origin: Secondary | ICD-10-CM | POA: Diagnosis present

## 2024-02-01 DIAGNOSIS — I132 Hypertensive heart and chronic kidney disease with heart failure and with stage 5 chronic kidney disease, or end stage renal disease: Secondary | ICD-10-CM | POA: Diagnosis present

## 2024-02-01 DIAGNOSIS — D6859 Other primary thrombophilia: Secondary | ICD-10-CM | POA: Diagnosis present

## 2024-02-01 DIAGNOSIS — Z83438 Family history of other disorder of lipoprotein metabolism and other lipidemia: Secondary | ICD-10-CM

## 2024-02-01 DIAGNOSIS — E1152 Type 2 diabetes mellitus with diabetic peripheral angiopathy with gangrene: Principal | ICD-10-CM | POA: Diagnosis present

## 2024-02-01 DIAGNOSIS — E11649 Type 2 diabetes mellitus with hypoglycemia without coma: Secondary | ICD-10-CM | POA: Diagnosis not present

## 2024-02-01 DIAGNOSIS — K2950 Unspecified chronic gastritis without bleeding: Secondary | ICD-10-CM | POA: Diagnosis present

## 2024-02-01 DIAGNOSIS — Z91199 Patient's noncompliance with other medical treatment and regimen due to unspecified reason: Secondary | ICD-10-CM

## 2024-02-01 DIAGNOSIS — K922 Gastrointestinal hemorrhage, unspecified: Secondary | ICD-10-CM | POA: Diagnosis present

## 2024-02-01 DIAGNOSIS — Z860100 Personal history of colon polyps, unspecified: Secondary | ICD-10-CM

## 2024-02-01 DIAGNOSIS — D631 Anemia in chronic kidney disease: Secondary | ICD-10-CM | POA: Diagnosis present

## 2024-02-01 DIAGNOSIS — Z90710 Acquired absence of both cervix and uterus: Secondary | ICD-10-CM

## 2024-02-01 DIAGNOSIS — Z880 Allergy status to penicillin: Secondary | ICD-10-CM

## 2024-02-01 DIAGNOSIS — Z992 Dependence on renal dialysis: Secondary | ICD-10-CM

## 2024-02-01 DIAGNOSIS — I10 Essential (primary) hypertension: Secondary | ICD-10-CM | POA: Diagnosis present

## 2024-02-01 DIAGNOSIS — Z888 Allergy status to other drugs, medicaments and biological substances status: Secondary | ICD-10-CM

## 2024-02-01 DIAGNOSIS — E1169 Type 2 diabetes mellitus with other specified complication: Secondary | ICD-10-CM | POA: Diagnosis present

## 2024-02-01 DIAGNOSIS — Z79899 Other long term (current) drug therapy: Secondary | ICD-10-CM

## 2024-02-01 DIAGNOSIS — Z8261 Family history of arthritis: Secondary | ICD-10-CM

## 2024-02-01 DIAGNOSIS — E1165 Type 2 diabetes mellitus with hyperglycemia: Secondary | ICD-10-CM | POA: Diagnosis present

## 2024-02-01 DIAGNOSIS — Z89511 Acquired absence of right leg below knee: Secondary | ICD-10-CM

## 2024-02-01 DIAGNOSIS — F321 Major depressive disorder, single episode, moderate: Secondary | ICD-10-CM | POA: Diagnosis present

## 2024-02-01 DIAGNOSIS — Z89512 Acquired absence of left leg below knee: Secondary | ICD-10-CM

## 2024-02-01 LAB — CBC WITH DIFFERENTIAL/PLATELET
Abs Immature Granulocytes: 0.14 K/uL — ABNORMAL HIGH (ref 0.00–0.07)
Basophils Absolute: 0 K/uL (ref 0.0–0.1)
Basophils Relative: 0 %
Eosinophils Absolute: 0 K/uL (ref 0.0–0.5)
Eosinophils Relative: 0 %
HCT: 29.2 % — ABNORMAL LOW (ref 36.0–46.0)
Hemoglobin: 8.6 g/dL — ABNORMAL LOW (ref 12.0–15.0)
Immature Granulocytes: 1 %
Lymphocytes Relative: 11 %
Lymphs Abs: 1.2 K/uL (ref 0.7–4.0)
MCH: 25.7 pg — ABNORMAL LOW (ref 26.0–34.0)
MCHC: 29.5 g/dL — ABNORMAL LOW (ref 30.0–36.0)
MCV: 87.4 fL (ref 80.0–100.0)
Monocytes Absolute: 0.6 K/uL (ref 0.1–1.0)
Monocytes Relative: 6 %
Neutro Abs: 8.9 K/uL — ABNORMAL HIGH (ref 1.7–7.7)
Neutrophils Relative %: 82 %
Platelets: 496 K/uL — ABNORMAL HIGH (ref 150–400)
RBC: 3.34 MIL/uL — ABNORMAL LOW (ref 3.87–5.11)
RDW: 18.4 % — ABNORMAL HIGH (ref 11.5–15.5)
WBC: 11 K/uL — ABNORMAL HIGH (ref 4.0–10.5)
nRBC: 1.4 % — ABNORMAL HIGH (ref 0.0–0.2)

## 2024-02-01 LAB — PROTIME-INR
INR: 1.1 (ref 0.8–1.2)
Prothrombin Time: 14.7 s (ref 11.4–15.2)

## 2024-02-01 MED ORDER — VANCOMYCIN HCL 1250 MG/250ML IV SOLN
1250.0000 mg | Freq: Once | INTRAVENOUS | Status: AC
  Administered 2024-02-02: 1250 mg via INTRAVENOUS
  Filled 2024-02-01: qty 250

## 2024-02-01 MED ORDER — SODIUM CHLORIDE 0.9 % IV SOLN
2.0000 g | INTRAVENOUS | Status: DC
  Administered 2024-02-02: 2 g via INTRAVENOUS
  Filled 2024-02-01: qty 20

## 2024-02-01 MED ORDER — METRONIDAZOLE 500 MG/100ML IV SOLN: 500.0000 mg | Freq: Three times a day (TID) | INTRAVENOUS | Status: DC

## 2024-02-01 NOTE — ED Provider Notes (Signed)
 [place] EMERGENCY DEPARTMENT AT [facility] Provider Note   CSN: [id number] Arrival date & time: 02/01/24  1851     Patient presents with: Wound Check   Kathryn Beck is a 68 y.o. female.   Patient presents to the emergency department for evaluation of wound infection of left hand.  Patient has known history of gangrene in the left hand and has been monitored by hand surgery.  Family has been performing wound care.  Family accompanying her today reports that things have been going okay but then it was noted that she had maggots in her hand today.       Prior to Admission medications   Medication Sig Start Date End Date Taking? Authorizing Provider  acetaminophen  (TYLENOL ) 500 MG tablet Take 1,000 mg by mouth every 6 (six) hours as needed for mild pain (pain score 1-3).    [provider]  amLODipine  (NORVASC ) 10 MG tablet Take 0.5 tablets (5 mg total) by mouth daily. 10/28/23   Pearlean Manus, MD  ASPIRIN  LOW DOSE 81 MG chewable tablet Chew 162 mg by mouth daily. 01/05/24   [provider]  B Complex-C-Folic Acid  (RENA-VITE RX) 1 MG TABS Take 1 tablet by mouth daily. 04/15/22   [provider]  busPIRone  (BUSPAR ) 7.5 MG tablet Take 1 tablet (7.5 mg total) by mouth 2 (two) times daily. 11/24/23   Antonetta Rollene BRAVO, MD  calcitRIOL  (ROCALTROL ) 0.5 MCG capsule Take 2 capsules (1 mcg total) by mouth every Monday, Wednesday, and Friday with hemodialysis. 11/05/23   Caleen Burgess BROCKS, MD  cinacalcet  (SENSIPAR ) 30 MG tablet Take 3 tablets (90 mg total) by mouth every Monday, Wednesday, and Friday. 12/08/23   Johnson, Clanford L, MD  ezetimibe  (ZETIA ) 10 MG tablet Take 1 tablet (10 mg total) by mouth daily. 04/27/23   Antonetta Rollene BRAVO, MD  insulin  aspart (NOVOLOG ) 100 UNIT/ML injection Inject 0-6 Units into the skin 3 (three) times daily with meals. CBG 70 - 120: 0 units  CBG 121 - 150: 0 units  CBG 151 - 200: 1 unit  CBG 201-250: 2 units  CBG 251-300: 3  units  CBG 301-350: 4 units  CBG 351-400: 5 units  CBG > 400: Give 10 units and call MD 11/12/23   Shahmehdi, Seyed A, MD  insulin  glargine, 2 Unit Dial , (TOUJEO  MAX SOLOSTAR) 300 UNIT/ML Solostar Pen Inject 30 Units into the skin daily. May need to slowly increase the dose depending upon your blood sugar, follow-up with PCP 01/01/23   Trixie File, MD  metoprolol  succinate (TOPROL -XL) 50 MG 24 hr tablet TAKE ONE TABLET BY MOUTH ONCE DAILY WITH FOOD (TAKE AFTER DIALYSIS) 12/20/23   Antonetta Rollene BRAVO, MD  nitroGLYCERIN  (NITRODUR - DOSED IN MG/24 HR) 0.2 mg/hr patch Place 1 patch (0.2 mg total) onto the skin daily. 12/28/23   Agarwala, Anshul, MD  ondansetron  (ZOFRAN ) 4 MG tablet Take 4 mg by mouth every 8 (eight) hours as needed for nausea or vomiting. 11/26/23   [provider]  pantoprazole  (PROTONIX ) 40 MG tablet Take 1 tablet (40 mg total) by mouth daily. 12/08/23   Johnson, Clanford L, MD  sertraline  (ZOLOFT ) 50 MG tablet Take 1.5 tablets (75 mg total) by mouth daily. Take one and a half tablets once daily 01/02/24   Johnson, Clanford L, MD  torsemide  (DEMADEX ) 100 MG tablet Take 1 tablet (100 mg total) by mouth every Tuesday, Thursday, Saturday, and Sunday. Non-dialysis days 01/02/24   Vicci Afton LITTIE, MD  [name]  500 MG chewable tablet Chew 1-2 tablets (500-1,000 mg total) by mouth See admin instructions. Take 1000mg  (2 tablets) by mouth with meals and 500mg  (1 tablet) with snacks 08/23/23   Antonetta Rollene BRAVO, MD  FLUoxetine (PROZAC) 10 MG capsule Take 10 mg by mouth daily.    05/28/11  [provider]  glipiZIDE  (GLUCOTROL ) 10 MG tablet Take 10 mg by mouth 2 (two) times daily before a meal.    05/28/11  [provider]    Allergies: Ace inhibitors, Penicillins, Statins, and Albuterol     Review of Systems  Updated Vital Signs BP (!) 170/90 (BP Location: Right Arm)   Pulse 81   Temp 98.1 F (36.7 C) (Oral)   Resp 18   SpO2 99%   Physical Exam Vitals and  nursing note reviewed.  Constitutional:      General: She is not in acute distress.    Appearance: She is well-developed.  HENT:     Head: Normocephalic and atraumatic.     Mouth/Throat:     Mouth: Mucous membranes are moist.  Eyes:     General: Vision grossly intact. Gaze aligned appropriately.     Extraocular Movements: Extraocular movements intact.     Conjunctiva/sclera: Conjunctivae normal.  Cardiovascular:     Rate and Rhythm: Normal rate and regular rhythm.     Pulses: Normal pulses.     Heart sounds: Normal heart sounds, S1 normal and S2 normal. No murmur heard.    No friction rub. No gallop.  Pulmonary:     Effort: Pulmonary effort is normal. No respiratory distress.     Breath sounds: Normal breath sounds.  Abdominal:     General: Bowel sounds are normal.     Palpations: Abdomen is soft.     Tenderness: There is no abdominal tenderness. There is no guarding or rebound.     Hernia: No hernia is present.  Musculoskeletal:        General: No swelling.     Cervical back: Full passive range of motion without pain, normal range of motion and neck supple. No spinous process tenderness or muscular tenderness. Normal range of motion.     Right lower leg: No edema.     Left lower leg: No edema.     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     Left Lower Extremity: Left leg is amputated below ankle.  Skin:    General: Skin is warm and dry.     Capillary Refill: Capillary refill takes less than 2 seconds.     Findings: No ecchymosis, erythema, rash or wound.  Neurological:     General: No focal deficit present.     Mental Status: She is alert and oriented to person, place, and time.     GCS: GCS eye subscore is 4. GCS verbal subscore is 5. GCS motor subscore is 6.     Cranial Nerves: Cranial nerves 2-12 are intact.     Sensory: Sensation is intact.     Motor: Motor function is intact.     Coordination: Coordination is intact.  Psychiatric:        Attention  and Perception: Attention normal.        Mood and Affect: Mood normal.        Speech: Speech normal.        Behavior: Behavior normal.          (all labs ordered are listed, but only abnormal results are displayed) Labs  Reviewed  CBC WITH DIFFERENTIAL/PLATELET - Abnormal; Notable for the following components:      Result Value   WBC 11.0 (*)    RBC 3.34 (*)    Hemoglobin 8.6 (*)    HCT 29.2 (*)    MCH 25.7 (*)    MCHC 29.5 (*)    RDW 18.4 (*)    Platelets 496 (*)    nRBC 1.4 (*)    Neutro Abs 8.9 (*)    Abs Immature Granulocytes 0.14 (*)    All other components within normal limits  CULTURE, BLOOD (ROUTINE X 2)  CULTURE, BLOOD (ROUTINE X 2)  AEROBIC CULTURE W GRAM STAIN (SUPERFICIAL SPECIMEN)  COMPREHENSIVE METABOLIC PANEL WITH GFR  PROTIME-INR  URINALYSIS, W/ REFLEX TO CULTURE (INFECTION SUSPECTED)  SEDIMENTATION RATE  C-REACTIVE PROTEIN  PREALBUMIN  I-STAT CG4 LACTIC ACID, ED  I-STAT CG4 LACTIC ACID, ED    EKG: None  Radiology: DG Hand Complete Left Result Date: 02/01/2024 EXAM: 3 OR MORE VIEW(S) XRAY OF THE LEFT HAND 02/01/2024 10:32:00 PM COMPARISON: 11/15/2023 CLINICAL HISTORY: known gangrene, maggot infested wound FINDINGS: BONES AND JOINTS: No acute fracture. No focal osseous lesion. No joint dislocation. Diffuse vascular calcifications. 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 No bone destruction seen to suggest acute osteomyelitis. SOFT TISSUES: Soft tissue gas noted concerning for gas forming infection. IMPRESSION: 1. Soft tissue gas concerning for gas-forming infection. 2. No radiographic evidence of acute osteomyelitis. Electronically signed by: Franky Crease MD 02/01/2024 10:37 PM EST RP Workstation: [id number]     Procedures   Medications Ordered in the ED  cefTRIAXone  (ROCEPHIN ) 2 g in sodium chloride  0.9 % 100 mL IVPB (has no administration in time  range)    And  metroNIDAZOLE  (FLAGYL ) IVPB 500 mg (has no administration in time range)  vancomycin  (VANCOREADY) IVPB 1250 mg/250 mL (has no administration in time range)                                    Medical Decision Making Amount and/or Complexity of Data Reviewed External Data Reviewed: labs, radiology and notes. Labs: ordered. Decision-making details documented in ED Course. Radiology: ordered and independent interpretation performed. Decision-making details documented in ED Course.  Risk Prescription drug management.   Differential diagnosis considered includes, but not limited to: Cellulitis; nec fasciitis; gangrene; osteomyelitis  Patient with known gangrene of the left hand, being monitored by Dr. Arlinda, hand surgery, presents with worsening.  Family reports that this area has become worse, now draining and today they noticed maggots deep within the wound area.  Examination reveals significant drainage from the midportion of the hand with multiple maggots visible in the tissues.  X-ray does not show any evidence of osteo.  Gas formation is consistent with exam as there is deep penetration into the tissues of the hand visible.  Initiated on broad-spectrum antibiotics, will ask hospitalist to admit patient for further management of active infection.      Final diagnoses:  Wound infection    ED Discharge Orders     None          Haze Lonni PARAS, MD 02/01/24 2355

## 2024-02-01 NOTE — ED Triage Notes (Signed)
 Pt arrive POV from home with family to have a wound on her left hand check, pt states she started having magets on her hand today.

## 2024-02-01 NOTE — ED Provider Triage Note (Signed)
 Emergency Medicine Provider Triage Evaluation Note  KATHLEENA FREEMAN , a 68 y.o. female  was evaluated in triage.  Pt complains of left hand wound check. Patient has known dry gangrene however daughter on phone states that maggots are now in wound. Daughter and patient states that they have been changing the wound dressing daily. Denies systemic symptoms.   Review of Systems  Positive: Left hand pain, gangrene of L hand Negative: Fever, chills, chest pain, shortness of breath  Physical Exam  BP (!) 170/90 (BP Location: Right Arm)   Pulse 81   Temp 98.1 F (36.7 C) (Oral)   Resp 18   SpO2 99%  Gen:   Awake, no distress   Resp:  Normal effort  MSK:   Moves extremities without difficulty  Other:  Dry gangrene present to L hand, maggots noticeably in wound, purulent drainage present   Medical Decision Making  Medically screening exam initiated at 10:06 PM.  Appropriate orders placed.  ALEATHA TAITE was informed that the remainder of the evaluation will be completed by another provider, this initial triage assessment does not replace that evaluation, and the importance of remaining in the ED until their evaluation is complete.  Orders: sepsis work-up, xray of L hand   Janetta Terrall F[place], [place] 02/01/24 2211

## 2024-02-01 NOTE — ED Notes (Signed)
 Attempted to draw blood, shook her head no, stating she needs to use bathroom.  Pt can only use bed pan. Notified nurse.

## 2024-02-02 ENCOUNTER — Encounter (HOSPITAL_COMMUNITY)
Admission: RE | Admit: 2024-02-02 | Discharge: 2024-02-02 | Disposition: A | Discharge status: Home / Self Care | Source: Ambulatory Visit | Attending: Internal Medicine | Admitting: Internal Medicine

## 2024-02-02 ENCOUNTER — Encounter (HOSPITAL_COMMUNITY): Payer: Self-pay | Admitting: Family Medicine

## 2024-02-02 DIAGNOSIS — F32A Depression, unspecified: Secondary | ICD-10-CM | POA: Diagnosis present

## 2024-02-02 DIAGNOSIS — Z79899 Other long term (current) drug therapy: Secondary | ICD-10-CM | POA: Diagnosis not present

## 2024-02-02 DIAGNOSIS — E1152 Type 2 diabetes mellitus with diabetic peripheral angiopathy with gangrene: Secondary | ICD-10-CM | POA: Diagnosis present

## 2024-02-02 DIAGNOSIS — Z89511 Acquired absence of right leg below knee: Secondary | ICD-10-CM | POA: Diagnosis not present

## 2024-02-02 DIAGNOSIS — N2581 Secondary hyperparathyroidism of renal origin: Secondary | ICD-10-CM | POA: Diagnosis present

## 2024-02-02 DIAGNOSIS — I96 Gangrene, not elsewhere classified: Secondary | ICD-10-CM | POA: Diagnosis present

## 2024-02-02 DIAGNOSIS — Z992 Dependence on renal dialysis: Secondary | ICD-10-CM

## 2024-02-02 DIAGNOSIS — I5032 Chronic diastolic (congestive) heart failure: Secondary | ICD-10-CM | POA: Diagnosis present

## 2024-02-02 DIAGNOSIS — Z794 Long term (current) use of insulin: Secondary | ICD-10-CM | POA: Diagnosis not present

## 2024-02-02 DIAGNOSIS — E1169 Type 2 diabetes mellitus with other specified complication: Secondary | ICD-10-CM

## 2024-02-02 DIAGNOSIS — I4891 Unspecified atrial fibrillation: Secondary | ICD-10-CM

## 2024-02-02 DIAGNOSIS — E875 Hyperkalemia: Secondary | ICD-10-CM | POA: Diagnosis present

## 2024-02-02 DIAGNOSIS — N186 End stage renal disease: Secondary | ICD-10-CM

## 2024-02-02 DIAGNOSIS — I509 Heart failure, unspecified: Secondary | ICD-10-CM | POA: Diagnosis not present

## 2024-02-02 DIAGNOSIS — I480 Paroxysmal atrial fibrillation: Secondary | ICD-10-CM | POA: Diagnosis present

## 2024-02-02 DIAGNOSIS — E1122 Type 2 diabetes mellitus with diabetic chronic kidney disease: Secondary | ICD-10-CM | POA: Diagnosis present

## 2024-02-02 DIAGNOSIS — E782 Mixed hyperlipidemia: Secondary | ICD-10-CM | POA: Diagnosis present

## 2024-02-02 DIAGNOSIS — D631 Anemia in chronic kidney disease: Secondary | ICD-10-CM | POA: Diagnosis present

## 2024-02-02 DIAGNOSIS — E11649 Type 2 diabetes mellitus with hypoglycemia without coma: Secondary | ICD-10-CM | POA: Diagnosis not present

## 2024-02-02 DIAGNOSIS — Z7982 Long term (current) use of aspirin: Secondary | ICD-10-CM | POA: Diagnosis not present

## 2024-02-02 DIAGNOSIS — I132 Hypertensive heart and chronic kidney disease with heart failure and with stage 5 chronic kidney disease, or end stage renal disease: Secondary | ICD-10-CM | POA: Diagnosis present

## 2024-02-02 DIAGNOSIS — Z8616 Personal history of COVID-19: Secondary | ICD-10-CM | POA: Diagnosis not present

## 2024-02-02 DIAGNOSIS — Z8249 Family history of ischemic heart disease and other diseases of the circulatory system: Secondary | ICD-10-CM | POA: Diagnosis not present

## 2024-02-02 DIAGNOSIS — G4733 Obstructive sleep apnea (adult) (pediatric): Secondary | ICD-10-CM | POA: Diagnosis present

## 2024-02-02 DIAGNOSIS — Z888 Allergy status to other drugs, medicaments and biological substances status: Secondary | ICD-10-CM | POA: Diagnosis not present

## 2024-02-02 DIAGNOSIS — D6859 Other primary thrombophilia: Secondary | ICD-10-CM | POA: Diagnosis present

## 2024-02-02 DIAGNOSIS — Z89512 Acquired absence of left leg below knee: Secondary | ICD-10-CM | POA: Diagnosis not present

## 2024-02-02 DIAGNOSIS — Z7984 Long term (current) use of oral hypoglycemic drugs: Secondary | ICD-10-CM | POA: Diagnosis not present

## 2024-02-02 DIAGNOSIS — L089 Local infection of the skin and subcutaneous tissue, unspecified: Secondary | ICD-10-CM | POA: Diagnosis present

## 2024-02-02 LAB — BASIC METABOLIC PANEL WITH GFR
Anion gap: 16 — ABNORMAL HIGH (ref 5–15)
BUN: 46 mg/dL — ABNORMAL HIGH (ref 8–23)
CO2: 26 mmol/L (ref 22–32)
Calcium: 8.9 mg/dL (ref 8.9–10.3)
Chloride: 100 mmol/L (ref 98–111)
Creatinine, Ser: 4.51 mg/dL — ABNORMAL HIGH (ref 0.44–1.00)
GFR, Estimated: 10 mL/min — ABNORMAL LOW (ref >60)
Glucose, Bld: 114 mg/dL — ABNORMAL HIGH (ref 70–99)
Potassium: 5.7 mmol/L — ABNORMAL HIGH (ref 3.5–5.1)
Sodium: 142 mmol/L (ref 135–145)

## 2024-02-02 LAB — GLUCOSE, CAPILLARY
Glucose-Capillary: 102 mg/dL — ABNORMAL HIGH (ref 70–99)
Glucose-Capillary: 89 mg/dL (ref 70–99)
Glucose-Capillary: 62 mg/dL — ABNORMAL LOW (ref 70–99)
Glucose-Capillary: 88 mg/dL (ref 70–99)
Glucose-Capillary: 57 mg/dL — ABNORMAL LOW (ref 70–99)
Glucose-Capillary: 99 mg/dL (ref 70–99)

## 2024-02-02 LAB — COMPREHENSIVE METABOLIC PANEL WITH GFR
ALT: 22 U/L (ref 0–44)
AST: 22 U/L (ref 15–41)
Albumin: 2.6 g/dL — ABNORMAL LOW (ref 3.5–5.0)
Alkaline Phosphatase: 109 U/L (ref 38–126)
Anion gap: 14 (ref 5–15)
BUN: 44 mg/dL — ABNORMAL HIGH (ref 8–23)
CO2: 26 mmol/L (ref 22–32)
Calcium: 9 mg/dL (ref 8.9–10.3)
Chloride: 100 mmol/L (ref 98–111)
Creatinine, Ser: 4.51 mg/dL — ABNORMAL HIGH (ref 0.44–1.00)
GFR, Estimated: 10 mL/min — ABNORMAL LOW (ref >60)
Glucose, Bld: 163 mg/dL — ABNORMAL HIGH (ref 70–99)
Potassium: 5.6 mmol/L — ABNORMAL HIGH (ref 3.5–5.1)
Sodium: 140 mmol/L (ref 135–145)
Total Bilirubin: 0.5 mg/dL (ref 0.0–1.2)
Total Protein: 6.8 g/dL (ref 6.5–8.1)

## 2024-02-02 LAB — CBC
HCT: 28.1 % — ABNORMAL LOW (ref 36.0–46.0)
HCT: 26.8 % — ABNORMAL LOW (ref 36.0–46.0)
Hemoglobin: 8.4 g/dL — ABNORMAL LOW (ref 12.0–15.0)
Hemoglobin: 8.1 g/dL — ABNORMAL LOW (ref 12.0–15.0)
MCH: 25.4 pg — ABNORMAL LOW (ref 26.0–34.0)
MCH: 25.6 pg — ABNORMAL LOW (ref 26.0–34.0)
MCHC: 29.9 g/dL — ABNORMAL LOW (ref 30.0–36.0)
MCHC: 30.2 g/dL (ref 30.0–36.0)
MCV: 84.9 fL (ref 80.0–100.0)
MCV: 84.8 fL (ref 80.0–100.0)
Platelets: 483 K/uL — ABNORMAL HIGH (ref 150–400)
Platelets: 470 K/uL — ABNORMAL HIGH (ref 150–400)
RBC: 3.31 MIL/uL — ABNORMAL LOW (ref 3.87–5.11)
RBC: 3.16 MIL/uL — ABNORMAL LOW (ref 3.87–5.11)
RDW: 18.1 % — ABNORMAL HIGH (ref 11.5–15.5)
RDW: 18 % — ABNORMAL HIGH (ref 11.5–15.5)
WBC: 11.5 K/uL — ABNORMAL HIGH (ref 4.0–10.5)
WBC: 11 K/uL — ABNORMAL HIGH (ref 4.0–10.5)
nRBC: 0.9 % — ABNORMAL HIGH (ref 0.0–0.2)
nRBC: 0.5 % — ABNORMAL HIGH (ref 0.0–0.2)

## 2024-02-02 LAB — RENAL FUNCTION PANEL
Albumin: 2.4 g/dL — ABNORMAL LOW (ref 3.5–5.0)
Anion gap: 16 — ABNORMAL HIGH (ref 5–15)
BUN: 52 mg/dL — ABNORMAL HIGH (ref 8–23)
CO2: 26 mmol/L (ref 22–32)
Calcium: 8.9 mg/dL (ref 8.9–10.3)
Chloride: 98 mmol/L (ref 98–111)
Creatinine, Ser: 5.03 mg/dL — ABNORMAL HIGH (ref 0.44–1.00)
GFR, Estimated: 9 mL/min — ABNORMAL LOW (ref >60)
Glucose, Bld: 76 mg/dL (ref 70–99)
Phosphorus: 5.3 mg/dL — ABNORMAL HIGH (ref 2.5–4.6)
Potassium: 5.3 mmol/L — ABNORMAL HIGH (ref 3.5–5.1)
Sodium: 140 mmol/L (ref 135–145)

## 2024-02-02 LAB — C-REACTIVE PROTEIN: CRP: 17.4 mg/dL — ABNORMAL HIGH (ref <1.0)

## 2024-02-02 LAB — HEPATITIS B SURFACE ANTIGEN: Hepatitis B Surface Ag: NONREACTIVE

## 2024-02-02 LAB — CG4 I-STAT (LACTIC ACID): Lactic Acid, Venous: 1.3 mmol/L (ref 0.5–1.9)

## 2024-02-02 LAB — PREALBUMIN: Prealbumin: 22 mg/dL (ref 18–38)

## 2024-02-02 LAB — SEDIMENTATION RATE: Sed Rate: 72 mm/h — ABNORMAL HIGH (ref 0–22)

## 2024-02-02 MED ORDER — ACETAMINOPHEN 650 MG RE SUPP: 650.0000 mg | Freq: Four times a day (QID) | RECTAL | Status: DC | PRN

## 2024-02-02 MED ORDER — PENTAFLUOROPROP-TETRAFLUOROETH EX AERO: 1.0000 | INHALATION_SPRAY | CUTANEOUS | Status: DC | PRN

## 2024-02-02 MED ORDER — CHLORHEXIDINE GLUCONATE 4 % EX SOLN: 60.0000 mL | Freq: Once | CUTANEOUS | Status: DC

## 2024-02-02 MED ORDER — SERTRALINE HCL 50 MG PO TABS
75.0000 mg | ORAL_TABLET | Freq: Every day | ORAL | Status: DC
  Administered 2024-02-02 – 2024-02-06 (×4): 75 mg via ORAL
  Filled 2024-02-02 (×4): qty 1

## 2024-02-02 MED ORDER — ANTICOAGULANT SODIUM CITRATE 4% (200MG/5ML) IV SOLN: 5.0000 mL | Status: DC | PRN

## 2024-02-02 MED ORDER — CEFAZOLIN SODIUM-DEXTROSE 2-4 GM/100ML-% IV SOLN
2.0000 g | INTRAVENOUS | Status: AC
  Administered 2024-02-03: 2 g via INTRAVENOUS
  Filled 2024-02-02: qty 100

## 2024-02-02 MED ORDER — AMLODIPINE BESYLATE 5 MG PO TABS
5.0000 mg | ORAL_TABLET | Freq: Every day | ORAL | Status: DC
  Administered 2024-02-02 – 2024-02-06 (×5): 5 mg via ORAL
  Filled 2024-02-02 (×5): qty 1

## 2024-02-02 MED ORDER — POVIDONE-IODINE 10 % EX SWAB
2.0000 | Freq: Once | CUTANEOUS | Status: AC
  Administered 2024-02-03: 2 via TOPICAL

## 2024-02-02 MED ORDER — DEXTROSE 50 % IV SOLN
12.5000 g | INTRAVENOUS | Status: AC
  Administered 2024-02-02: 12.5 g via INTRAVENOUS
  Filled 2024-02-02: qty 50

## 2024-02-02 MED ORDER — VANCOMYCIN HCL 750 MG/150ML IV SOLN
750.0000 mg | INTRAVENOUS | Status: AC
  Filled 2024-02-02 (×4): qty 150

## 2024-02-02 MED ORDER — ACETAMINOPHEN 325 MG PO TABS
650.0000 mg | ORAL_TABLET | Freq: Four times a day (QID) | ORAL | Status: DC | PRN
  Administered 2024-02-04 – 2024-02-06 (×6): 650 mg via ORAL
  Filled 2024-02-02 (×6): qty 2

## 2024-02-02 MED ORDER — LIDOCAINE HCL (PF) 1 % IJ SOLN: 5.0000 mL | INTRAMUSCULAR | Status: DC | PRN

## 2024-02-02 MED ORDER — FENTANYL CITRATE (PF) 50 MCG/ML IJ SOSY
12.5000 ug | PREFILLED_SYRINGE | INTRAMUSCULAR | Status: DC | PRN
  Administered 2024-02-03 – 2024-02-04 (×2): 25 ug via INTRAVENOUS
  Filled 2024-02-02 (×2): qty 1

## 2024-02-02 MED ORDER — METRONIDAZOLE 500 MG/100ML IV SOLN
500.0000 mg | Freq: Two times a day (BID) | INTRAVENOUS | Status: AC
Start: 2024-02-02 — End: 2024-02-04
  Administered 2024-02-02 – 2024-02-04 (×4): 500 mg via INTRAVENOUS
  Filled 2024-02-02 (×5): qty 100

## 2024-02-02 MED ORDER — PANTOPRAZOLE SODIUM 40 MG PO TBEC
40.0000 mg | DELAYED_RELEASE_TABLET | Freq: Every day | ORAL | Status: DC
  Administered 2024-02-02 – 2024-02-06 (×4): 40 mg via ORAL
  Filled 2024-02-02 (×4): qty 1

## 2024-02-02 MED ORDER — SODIUM CHLORIDE 0.9 % IV SOLN
2.0000 g | INTRAVENOUS | Status: AC
  Administered 2024-02-02 – 2024-02-04 (×3): 2 g via INTRAVENOUS
  Filled 2024-02-02 (×3): qty 20

## 2024-02-02 MED ORDER — LIDOCAINE-PRILOCAINE 2.5-2.5 % EX CREA: 1.0000 | TOPICAL_CREAM | CUTANEOUS | Status: DC | PRN

## 2024-02-02 MED ORDER — GLUCOSE 40 % PO GEL
1.0000 | ORAL | Status: AC
  Administered 2024-02-02: 31 g via ORAL
  Filled 2024-02-02: qty 1.21

## 2024-02-02 MED ORDER — NEPRO/CARBSTEADY PO LIQD: 237.0000 mL | ORAL | Status: DC | PRN

## 2024-02-02 MED ORDER — ALTEPLASE 2 MG IJ SOLR: 2.0000 mg | Freq: Once | INTRAMUSCULAR | Status: DC | PRN

## 2024-02-02 MED ORDER — SODIUM CHLORIDE 0.9% FLUSH
3.0000 mL | Freq: Two times a day (BID) | INTRAVENOUS | Status: DC
  Administered 2024-02-02 – 2024-02-06 (×9): 3 mL via INTRAVENOUS

## 2024-02-02 MED ORDER — OXYCODONE HCL 5 MG PO TABS
5.0000 mg | ORAL_TABLET | ORAL | Status: DC | PRN
  Administered 2024-02-04 – 2024-02-06 (×11): 5 mg via ORAL
  Filled 2024-02-02 (×11): qty 1

## 2024-02-02 MED ORDER — CALCITRIOL 0.5 MCG PO CAPS
1.0000 ug | ORAL_CAPSULE | ORAL | Status: DC
  Administered 2024-02-02 – 2024-02-04 (×2): 1 ug via ORAL
  Filled 2024-02-02 (×2): qty 2

## 2024-02-02 MED ORDER — BUSPIRONE HCL 5 MG PO TABS
7.5000 mg | ORAL_TABLET | Freq: Two times a day (BID) | ORAL | Status: DC
  Administered 2024-02-02 – 2024-02-06 (×8): 7.5 mg via ORAL
  Filled 2024-02-02 (×9): qty 2

## 2024-02-02 MED ORDER — PROCHLORPERAZINE EDISYLATE 10 MG/2ML IJ SOLN: 5.0000 mg | Freq: Four times a day (QID) | INTRAMUSCULAR | Status: DC | PRN

## 2024-02-02 MED ORDER — HEPARIN SODIUM (PORCINE) 1000 UNIT/ML DIALYSIS: 1000.0000 [IU] | INTRAMUSCULAR | Status: DC | PRN

## 2024-02-02 MED ORDER — INSULIN ASPART 100 UNIT/ML IJ SOLN: 0.0000 [IU] | INTRAMUSCULAR | Status: DC

## 2024-02-02 MED ORDER — HEPARIN SODIUM (PORCINE) 1000 UNIT/ML DIALYSIS: 1500.0000 [IU] | INTRAMUSCULAR | Status: DC | PRN

## 2024-02-02 MED ORDER — TORSEMIDE 100 MG PO TABS
100.0000 mg | ORAL_TABLET | ORAL | Status: DC
  Administered 2024-02-04 – 2024-02-06 (×3): 100 mg via ORAL
  Filled 2024-02-02 (×3): qty 1

## 2024-02-02 MED ORDER — SENNA 8.6 MG PO TABS: 1.0000 | ORAL_TABLET | Freq: Every day | ORAL | Status: DC | PRN

## 2024-02-02 MED ORDER — METOPROLOL SUCCINATE ER 50 MG PO TB24
50.0000 mg | ORAL_TABLET | Freq: Every day | ORAL | Status: DC
  Administered 2024-02-02 – 2024-02-06 (×5): 50 mg via ORAL
  Filled 2024-02-02 (×5): qty 1

## 2024-02-02 MED ORDER — HEPARIN SODIUM (PORCINE) 1000 UNIT/ML DIALYSIS
1500.0000 [IU] | Freq: Once | INTRAMUSCULAR | Status: DC
  Filled 2024-02-02: qty 2

## 2024-02-02 MED ORDER — SODIUM ZIRCONIUM CYCLOSILICATE 10 G PO PACK
10.0000 g | PACK | Freq: Once | ORAL | Status: AC
  Administered 2024-02-02: 10 g via ORAL
  Filled 2024-02-02: qty 1

## 2024-02-02 MED ORDER — CINACALCET HCL 30 MG PO TABS
90.0000 mg | ORAL_TABLET | ORAL | Status: DC
  Administered 2024-02-02 – 2024-02-04 (×2): 90 mg via ORAL
  Filled 2024-02-02 (×2): qty 3

## 2024-02-02 MED ORDER — CHLORHEXIDINE GLUCONATE CLOTH 2 % EX PADS
6.0000 | MEDICATED_PAD | Freq: Every day | CUTANEOUS | Status: DC
  Administered 2024-02-03 – 2024-02-06 (×4): 6 via TOPICAL

## 2024-02-02 MED ORDER — HEPARIN SODIUM (PORCINE) 5000 UNIT/ML IJ SOLN
5000.0000 [IU] | Freq: Three times a day (TID) | INTRAMUSCULAR | Status: DC
  Administered 2024-02-02 – 2024-02-06 (×9): 5000 [IU] via SUBCUTANEOUS
  Filled 2024-02-02 (×10): qty 1

## 2024-02-02 NOTE — Progress Notes (Signed)
 Pt receives out-pt HD at [facility], MWF 0700 chair time. Clinic informed of pt arrival. Will assist as needed.    [facility] Manoj Enriquez Dialysis Navigator [phone]

## 2024-02-02 NOTE — Assessment & Plan Note (Signed)
 Baseline ESRD on hemodialysis Monday Wednesday Friday Will consult Dr. Vassie for initiation of hemodialysis while in house

## 2024-02-02 NOTE — ED Notes (Signed)
 Delay in pt care and trx to unit due to no IV access. IV team consulted and completed.

## 2024-02-02 NOTE — Consult Note (Addendum)
 Renal Service Consult Note [facility] Kathryn JONETTA Fret, MD  Patient: Kathryn Beck Date: 02/02/2024 Requesting Physician: Dr. Eldonna  Reason for Consult: ESRD pt w/ gangrene of the L hand HPI: The patient is a 69 y.o. year-old w/ PMH as below who presented to ED last night c/o worsening foul smell to the L hand. On HD MWF, last HD Monday. In ED BP 160/70, HR 80s, RR 16.  K+ 5.6, creat 4. Pt was admitted. We are asked to see for dialysis.    Pt seen in room. No c/o's, has not missed HD. No SOB or leg swelling. Has bilat BKA.    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  Last Assessment & Plan:   Multiple polyps with surveillance due in 2021.  IMO SNOMED Dx Update Oct 2024     History of pneumonia 2019   Hypoxia 04/03/2019   Memory loss    Mixed hyperlipidemia    Not currently working due to disabled status 06/13/2018   Formatting of this note might be different from the original.  Last Assessment & Plan:   Formatting of this note might be different from  the original.  Reports being  disabled since April/May 2019 , hospitalized then in a niursing home, now on dialysis and is unable to work     Obesity    Pancreatitis    Peritoneal dialysis catheter in place    Pneumonia due to COVID-19 virus 04/02/2019   Sleep apnea    Noncompliant with CPAP   Stroke (HCC)    mini stroke   Type 2 diabetes mellitus (HCC)    Past Surgical History  Past Surgical History:  Procedure Laterality Date   ABDOMINAL AORTOGRAM W/LOWER EXTREMITY N/A 04/30/2022   Procedure: ABDOMINAL AORTOGRAM W/LOWER EXTREMITY;  Surgeon: Magda Debby SAILOR, MD;  Location: [facility];  Service: Cardiovascular;  Laterality: N/A;   ABDOMINAL AORTOGRAM W/LOWER EXTREMITY N/A 07/21/2022   Procedure: ABDOMINAL AORTOGRAM W/LOWER EXTREMITY;  Surgeon: Serene Gaile ORN, MD;  Location: [facility];  Service: Cardiovascular;  Laterality: N/A;   ABDOMINAL HYSTERECTOMY     ACHILLES TENDON LENGTHENING  08/15/2022   Procedure: ACHILLES TENDON LENGTHENING;  Surgeon: Silva Juliene SAUNDERS, DPM;  Location: [facility] OR;  Service: Podiatry;;   AMPUTATION Right 05/29/2022   Procedure: RIGHT BELOW THE KNEE AMPUTATION;  Surgeon: Harden Jerona GAILS, MD;  Location: [facility] OR;  Service: Orthopedics;  Laterality: Right;   AMPUTATION Left 09/04/2022   Procedure: AMPUTATION FOOT, serial irrigation;  Surgeon: Joya Stabs, DPM;  Location: [facility] OR;  Service: Podiatry;  Laterality: Left;  Surgical team to do block   AMPUTATION Left 10/07/2022   Procedure: LEFT BELOW KNEE AMPUTATION;  Surgeon: Harden Jerona GAILS, MD;  Location: [facility] OR;  Service: Orthopedics;  Laterality: Left;   AMPUTATION FINGER Left 09/29/2023   Procedure: AMPUTATION, FINGER;  Surgeon: Harden Jerona GAILS, MD;  Location: [facility] OR;  Service: Orthopedics;  Laterality: Left;  LEFT HAND LONG FINGER RAY AMPUTATION   AV FISTULA PLACEMENT Left 09/02/2017   Procedure: creation of left arm ARTERIOVENOUS (AV) FISTULA;  Surgeon: Serene Gaile ORN, MD;  Location: [facility] OR;  Service:  Vascular;  Laterality: Left;   COLONOSCOPY  2008   Dr. Harvey: normal    COLONOSCOPY N/A 12/18/2016   Dr. Harvey: multiple tubular adenomas, internal hemorrhoids. Surveillance in 3 years    COLONOSCOPY N/A 12/05/2023   Procedure: COLONOSCOPY;  Surgeon: Cindie Carlin POUR, DO;  Location: [facility];  Service: Endoscopy;  Laterality: N/A;   COLONOSCOPY N/A 12/17/2023   Procedure: COLONOSCOPY;  Surgeon: Shaaron Kathryn HERO, MD;  Location: [facility];  Service: Endoscopy;  Laterality: N/A;   COLONOSCOPY N/A 01/01/2024   Procedure: COLONOSCOPY;  Surgeon: Cindie Carlin POUR, DO;  Location: [facility];  Service: Endoscopy;  Laterality: N/A;   ESOPHAGEAL DILATION N/A 10/13/2015   Procedure: ESOPHAGEAL DILATION;  Surgeon: Claudis RAYMOND Rivet, MD;  Location: [facility];  Service: Endoscopy;  Laterality: N/A;   ESOPHAGEAL DILATION N/A 12/03/2023   Procedure: DILATION, ESOPHAGUS;  Surgeon: Cindie Carlin POUR, DO;  [facility]: [facility];  Service: Endoscopy;  Laterality: N/A;   ESOPHAGOGASTRODUODENOSCOPY N/A 10/13/2015   Dr. Rivet: chronic gastritis on path, no H.pylori. Empiric dilation    ESOPHAGOGASTRODUODENOSCOPY N/A 12/18/2016   Dr. Harvey: mild gastritis. BRAVO study revealed uncontrolled GERD. Dysphagia secondary to uncontrolled reflux   ESOPHAGOGASTRODUODENOSCOPY N/A 12/03/2023   Procedure: EGD (ESOPHAGOGASTRODUODENOSCOPY);  Surgeon: Cindie Carlin POUR, DO;  [facility]: [facility];  Service: Endoscopy;  Laterality: N/A;   FOOT SURGERY Bilateral    nerve     INCISION AND DRAINAGE OF WOUND Left 10/30/2023   Procedure: IRRIGATION AND DEBRIDEMENT WOUND;  Surgeon: Arlinda Buster, MD;  Location: [facility] OR;  Service: Orthopedics;  Laterality: Left;  LEFT HAND WOUND   LEFT HEART CATH AND CORONARY ANGIOGRAPHY N/A 12/29/2018   Procedure: LEFT HEART CATH AND CORONARY ANGIOGRAPHY;  Surgeon: Dann Candyce RAMAN, MD;  Location: [facility] INVASIVE CV LAB;  Service: Cardiovascular;  Laterality: N/A;   LOWER EXTREMITY  ANGIOGRAPHY Right 05/04/2022   Procedure: Lower Extremity Angiography;  Surgeon: Lanis Fonda BRAVO, MD;  Location: [facility] INVASIVE CV LAB;  Service: Cardiovascular;  Laterality: Right;   LUNG BIOPSY     MASS EXCISION Right 01/09/2013   Procedure: EXCISION OF NEOPLASM OF RIGHT  AXILLA  AND EXCISION OF NEOPLASM OF LEFT AXILLA;  Surgeon: Oneil DELENA Budge, MD;  Location: [facility];  Service: General;  Laterality: Right;  procedure end @ 08:23   MYRINGOTOMY WITH TUBE PLACEMENT Bilateral 04/28/2017   Procedure: BILATERAL MYRINGOTOMY WITH TUBE PLACEMENT;  Surgeon: Karis Clunes, MD;  Location: [facility] OR;  Service: ENT;  Laterality: Bilateral;   PERIPHERAL VASCULAR BALLOON ANGIOPLASTY Right 05/04/2022   Procedure: PERIPHERAL VASCULAR BALLOON ANGIOPLASTY;  Surgeon: Lanis Fonda BRAVO, MD;  Location: [facility] INVASIVE CV LAB;  Service: Cardiovascular;  Laterality: Right;  PT  PERIPHERAL VASCULAR INTERVENTION Right 05/04/2022   Procedure: PERIPHERAL VASCULAR INTERVENTION;  Surgeon: Lanis Fonda BRAVO, MD;  Location: [facility] INVASIVE CV LAB;  Service: Cardiovascular;  Laterality: Right;  SFA   PERIPHERAL VASCULAR INTERVENTION Left 07/21/2022   Procedure: PERIPHERAL VASCULAR INTERVENTION;  Surgeon: Serene Gaile ORN, MD;  Location: [facility];  Service: Cardiovascular;  Laterality: Left;   REVISION OF ARTERIOVENOUS GORETEX GRAFT Left 05/04/2018   Procedure: TRANSPOSITION OF CEPHALIC VEIN ARTERIOVENOUS FISTULA LEFT ARM;  Surgeon: Oris Krystal FALCON, MD;  Location: [facility] OR;  Service: Vascular;  Laterality: Left;   SAVORY DILATION N/A 12/18/2016   Procedure: SAVORY DILATION;  Surgeon: Harvey Margo CROME, MD;  [facility];  Service: Endoscopy;  Laterality: N/A;   TRANSMETATARSAL AMPUTATION Left 08/15/2022   Procedure: TRANSMETATARSAL AMPUTATION;  Surgeon: Silva Juliene SAUNDERS, DPM;  Location: [facility] OR;  Service: Podiatry;  Laterality: Left;   Family History  Family History  Problem Relation Age of Onset   Hypertension Father    Hypercholesterolemia  Father    Arthritis Father    Hypertension Sister    Hypercholesterolemia Sister    Breast cancer Sister    Hypertension Sister    Colon cancer Neg Hx    Colon polyps Neg Hx    Social History  reports that she has never smoked. She has never been exposed to tobacco smoke. She has never used smokeless tobacco. She reports that she does not drink alcohol and does not use drugs. Allergies  Allergies  Allergen Reactions   Ace Inhibitors Anaphylaxis and Swelling   Penicillins Itching and Swelling    Has tolerated cefazolin  on multiple occasions    Statins Other (See Comments)    Elevated LFT's   Albuterol  Swelling   Home medications Prior to Admission medications   Medication Sig Start Date End Date Taking? Authorizing Provider  acetaminophen  (TYLENOL ) 500 MG tablet Take 1,000 mg by mouth every 6 (six) hours as needed for mild pain (pain score 1-3).   Yes [provider]  amLODipine  (NORVASC ) 10 MG tablet Take 0.5 tablets (5 mg total) by mouth daily. 10/28/23  Yes Emokpae, Courage, MD  ASPIRIN  LOW DOSE 81 MG chewable tablet Chew 162 mg by mouth daily. 01/05/24  Yes [provider]  B Complex-C-Folic Acid  (RENA-VITE RX) 1 MG TABS Take 1 tablet by mouth daily. 04/15/22  Yes [provider]  busPIRone  (BUSPAR ) 7.5 MG tablet Take 1 tablet (7.5 mg total) by mouth 2 (two) times daily. 11/24/23  Yes Antonetta Rollene BRAVO, MD  calcitRIOL  (ROCALTROL ) 0.5 MCG capsule Take 2 capsules (1 mcg total) by mouth every Monday, Wednesday, and Friday with hemodialysis. 11/05/23  Yes Amin, Burgess BROCKS, MD  cinacalcet  (SENSIPAR ) 30 MG tablet Take 3 tablets (90 mg total) by mouth every Monday, Wednesday, and Friday. 12/08/23  Yes Johnson, Clanford L, MD  ezetimibe  (ZETIA ) 10 MG tablet Take 1 tablet (10 mg total) by mouth daily. 04/27/23  Yes Antonetta Rollene BRAVO, MD  insulin  aspart (NOVOLOG ) 100 UNIT/ML injection Inject 0-6 Units into the skin 3 (three) times daily with meals. CBG 70 - 120: 0 units   CBG 121 - 150: 0 units  CBG 151 - 200: 1 unit  CBG 201-250: 2 units  CBG 251-300: 3 units  CBG 301-350: 4 units  CBG 351-400: 5 units  CBG > 400: Give 10 units and call MD 11/12/23  Yes Shahmehdi, Seyed A, MD  insulin  glargine, 2 Unit Dial , (TOUJEO  MAX SOLOSTAR) 300 UNIT/ML Solostar Pen  Inject 30 Units into the skin daily. May need to slowly increase the dose depending upon your blood sugar, follow-up with PCP 01/01/23  Yes Trixie File, MD  metoprolol  succinate (TOPROL -XL) 50 MG 24 hr tablet TAKE ONE TABLET BY MOUTH ONCE DAILY WITH FOOD (TAKE AFTER DIALYSIS) 12/20/23  Yes Antonetta Rollene BRAVO, MD  ondansetron  (ZOFRAN ) 4 MG tablet Take 4 mg by mouth every 8 (eight) hours as needed for nausea or vomiting. 11/26/23  Yes [provider]  pantoprazole  (PROTONIX ) 40 MG tablet Take 1 tablet (40 mg total) by mouth daily. 12/08/23  Yes Johnson, Clanford L, MD  sertraline  (ZOLOFT ) 50 MG tablet Take 1.5 tablets (75 mg total) by mouth daily. Take one and a half tablets once daily 01/02/24  Yes Johnson, Clanford L, MD  torsemide  (DEMADEX ) 100 MG tablet Take 1 tablet (100 mg total) by mouth every Tuesday, Thursday, Saturday, and Sunday. Non-dialysis days 01/02/24  Yes Johnson, Clanford L, MD  [name]  500 MG chewable tablet Chew 1-2 tablets (500-1,000 mg total) by mouth See admin instructions. Take 1000mg  (2 tablets) by mouth with meals and 500mg  (1 tablet) with snacks 08/23/23  Yes Antonetta Rollene BRAVO, MD  nitroGLYCERIN  (NITRODUR - DOSED IN MG/24 HR) 0.2 mg/hr patch Place 1 patch (0.2 mg total) onto the skin daily. Patient not taking: Reported on 02/02/2024 12/28/23   Arlinda Buster, MD  FLUoxetine (PROZAC) 10 MG capsule Take 10 mg by mouth daily.    05/28/11  [provider]  glipiZIDE  (GLUCOTROL ) 10 MG tablet Take 10 mg by mouth 2 (two) times daily before a meal.    05/28/11  [provider]     Vitals:   02/01/24 2012 02/02/24 0029 02/02/24 0317 02/02/24 0726  BP: (!) 170/90 (!)  165/78 (!) 158/78 (!) 155/77  Pulse: 81 68 62 69  Resp: 18 15 18 16   Temp: 98.1 F (36.7 C) 98 F (36.7 C) 98.1 F (36.7 C) 98 F (36.7 C)  TempSrc: Oral Oral Oral Oral  SpO2: 99% 98% 100% 95%   Exam Gen alert, no distress, on RA, watching TV Sclera anicteric, throat clear  No jvd or bruits Chest clear bilat to bases RRR no MRG Abd soft ntnd no mass or ascites +bs Ext bilat BKA, no stump edema, no other edema Neuro is alert, Ox 3 , nf    AVF+bruit   Home bp meds: Norvasc  5 mg every day Metoprolol  xl 50mg  daily (after hd on hd days) Torsemide  100mg  every day on non hd days   OP HD: [facility] MWF oct 2025 -> 3.5h   70.5kg AVF Heparin  1000 + 1000u/hr   K+ 5.7  , BUN 46, creat 4.51  hb 8.4     Assessment/ Plan: Gangrene: of L hand. Per hand surgeon/ pmd.  ESRD: on HD MWF. HD today most likely.  HTN: cont home meds, bp's up a bit.  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 Transfuse prn.    Myer Fret  MD [facility] 02/02/2024, 12:59 PM  Recent Labs  Lab 02/01/24 2312 02/02/24 0444  HGB 8.6* 8.4*  ALBUMIN  2.6*  --   CALCIUM  9.0 8.9  CREATININE 4.51* 4.51*  K 5.6* 5.7*   Inpatient medications:  amLODipine   5 mg Oral Daily   busPIRone   7.5 mg Oral BID   calcitRIOL   1 mcg Oral Q M,W,F-HD   cinacalcet   90 mg Oral Q M,W,F   heparin   5,000  Units Subcutaneous Q8H   insulin  aspart  0-6 Units Subcutaneous Q4H   metoprolol  succinate  50 mg Oral Daily   pantoprazole   40 mg Oral Daily   sertraline   75 mg Oral Daily   sodium chloride  flush  3 mL Intravenous Q12H   [START ON 02/03/2024] torsemide   100 mg Oral Q T,Th,S,Su    cefTRIAXone  (ROCEPHIN )  IV     metronidazole  500 mg (02/02/24 0848)   vancomycin      acetaminophen  **OR** acetaminophen , fentaNYL  (SUBLIMAZE ) injection, oxyCODONE , prochlorperazine , senna

## 2024-02-02 NOTE — Procedures (Signed)
 S: seen in HD unit, tolerating 2 L goal so far.   Vitals:   02/02/24 1500 02/02/24 1619 02/02/24 1630 02/02/24 1700  BP:  (!) 154/77 (!) 147/67 136/64  Pulse:  60 65 (!) 58  Resp:      Temp:  98.2 F (36.8 C)    TempSrc:  Oral    SpO2:  99%    Weight: 74.2 kg 71 kg      Recent Labs  Lab 02/01/24 2312 02/02/24 0444 02/02/24 1502  HGB 8.6* 8.4* 8.1*  ALBUMIN  2.6*  --  2.4*  CALCIUM  9.0 8.9 8.9  PHOS  --   --  5.3*  CREATININE 4.51* 4.51* 5.03*  K 5.6* 5.7* 5.3*    Inpatient medications:  amLODipine   5 mg Oral Daily   busPIRone   7.5 mg Oral BID   calcitRIOL   1 mcg Oral Q M,W,F-HD   [START ON 02/03/2024] Chlorhexidine  Gluconate Cloth  6 each Topical Q0600   cinacalcet   90 mg Oral Q M,W,F   heparin   1,500 Units Dialysis Once in dialysis   heparin   5,000 Units Subcutaneous Q8H   insulin  aspart  0-6 Units Subcutaneous Q4H   metoprolol  succinate  50 mg Oral Daily   pantoprazole   40 mg Oral Daily   sertraline   75 mg Oral Daily   sodium chloride  flush  3 mL Intravenous Q12H   [START ON 02/03/2024] torsemide   100 mg Oral Q T,Th,S,Su    anticoagulant sodium citrate      cefTRIAXone  (ROCEPHIN )  IV     metronidazole  500 mg (02/02/24 0848)   vancomycin      acetaminophen  **OR** acetaminophen , alteplase , anticoagulant sodium citrate , feeding supplement (NEPRO CARB STEADY), fentaNYL  (SUBLIMAZE ) injection, heparin , heparin , lidocaine  (PF), lidocaine -prilocaine , oxyCODONE , pentafluoroprop-tetrafluoroeth, prochlorperazine , senna  I was present at the procedure, reviewed the HD regimen and made appropriate changes.   Myer Fret MD  [facility] 02/02/2024, 5:10 PM

## 2024-02-02 NOTE — Pre-Procedure Instructions (Signed)
 Attempted pre-op  phone call. VM is full, could not leave a message.

## 2024-02-02 NOTE — H&P (Addendum)
 History and Physical    Patient: Kathryn Beck FMW:[id number] DOB: [birth year] DOA: 02/01/2024 DOS: the patient was seen and examined on 02/02/2024 PCP: Antonetta Rollene BRAVO, MD  Patient coming from: Home  Chief Complaint:  Chief Complaint  Patient presents with   Wound Check   HPI: Kathryn Beck is a 68 y.o. 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  History from patient as well as family at bedside.  Per report, patient followed chronically in the patient today by Dr. Brutus for left extremity edema.  Has been monitored closely in the outpatient setting is likely.  Margins have been fairly dry present.  However, per the family, there has been worsening foul smell in addition to maggots present in the inner finger creases.  No reported trauma to the affected area.  Sugars have been high at home though full specifications not given.  No chest pain or shortness of breath.  No nausea or vomiting.  On hemodialysis Monday Wednesday Friday.  No missed dosing.  Noted to have been admitted in the antibiotics over for GI bleed.  Anticoagulation held from that admission.  Denies any further episodes of GI bleeding at home Presented to the ER afebrile, blood pressures 150s to 170s over 70s.  Satting well room air.  White count 11, hemoglobin 8.6, platelets 496.  Creatinine 4 point potassium 5.6.  Blood cultures drawn.  CRP 17.4.  Sed rate 72.  Left hand x-ray showing soft tissue gas concerning for gas-forming infection.  Otherwise no radiographic evidence for osteo myelitis.  Started on broad-spectrum antibiotics in the ER.  EKG normal sinus rhythm with no peaked T waves. Review of Systems: As mentioned in the history of present illness. All other systems  reviewed and are negative. 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  Last Assessment & Plan:   Multiple polyps with surveillance due in 2021.  IMO SNOMED Dx Update Oct 2024     History of pneumonia 2019   Hypoxia 04/03/2019   Memory loss    Mixed hyperlipidemia    Not currently working due to disabled status 06/13/2018   Formatting of this note might be different from the original.  Last Assessment & Plan:   Formatting of this note might be different from the original.  Reports being  disabled since April/May 2019 ,  hospitalized then in a niursing home, now on dialysis and is unable to work     Obesity    Pancreatitis    Peritoneal dialysis catheter in place    Pneumonia due to COVID-19 virus 04/02/2019   Sleep apnea    Noncompliant with CPAP   Stroke (HCC)    mini stroke   Type 2 diabetes mellitus (HCC)    Past Surgical History:  Procedure Laterality Date   ABDOMINAL AORTOGRAM W/LOWER EXTREMITY N/A 04/30/2022   Procedure: ABDOMINAL AORTOGRAM W/LOWER EXTREMITY;  Surgeon: Magda Debby SAILOR, MD;  Location: [facility];  Service: Cardiovascular;  Laterality: N/A;   ABDOMINAL AORTOGRAM W/LOWER  EXTREMITY N/A 07/21/2022   Procedure: ABDOMINAL AORTOGRAM W/LOWER EXTREMITY;  Surgeon: Serene Gaile ORN, MD;  Location: [facility];  Service: Cardiovascular;  Laterality: N/A;   ABDOMINAL HYSTERECTOMY     ACHILLES TENDON LENGTHENING  08/15/2022   Procedure: ACHILLES TENDON LENGTHENING;  Surgeon: Silva Juliene SAUNDERS, DPM;  Location: [facility] OR;  Service: Podiatry;;   AMPUTATION Right 05/29/2022   Procedure: RIGHT BELOW THE KNEE AMPUTATION;  Surgeon: Harden Jerona GAILS, MD;  Location: [facility] OR;  Service: Orthopedics;  Laterality: Right;   AMPUTATION Left 09/04/2022   Procedure: AMPUTATION FOOT, serial irrigation;  Surgeon: Joya Stabs, DPM;  Location: [facility] OR;  Service: Podiatry;  Laterality: Left;  Surgical team to do block   AMPUTATION Left 10/07/2022   Procedure: LEFT BELOW KNEE AMPUTATION;  Surgeon: Harden Jerona GAILS, MD;  Location: [facility] OR;  Service: Orthopedics;  Laterality: Left;   AMPUTATION FINGER Left 09/29/2023   Procedure: AMPUTATION, FINGER;  Surgeon: Harden Jerona GAILS, MD;  Location: [facility] OR;  Service: Orthopedics;  Laterality: Left;  LEFT HAND LONG FINGER RAY AMPUTATION   AV FISTULA PLACEMENT Left 09/02/2017   Procedure: creation of left arm ARTERIOVENOUS (AV) FISTULA;  Surgeon: Serene Gaile ORN, MD;  Location: [facility] OR;  Service: Vascular;  Laterality: Left;   COLONOSCOPY  2008   Dr. Harvey: normal    COLONOSCOPY N/A 12/18/2016   Dr. Harvey: multiple tubular adenomas, internal hemorrhoids. Surveillance in 3 years    COLONOSCOPY N/A 12/05/2023   Procedure: COLONOSCOPY;  Surgeon: Cindie Carlin POUR, DO;  Location: [facility];  Service: Endoscopy;  Laterality: N/A;   COLONOSCOPY N/A 12/17/2023   Procedure: COLONOSCOPY;  Surgeon: Shaaron Lamar HERO, MD;  Location: [facility];  Service: Endoscopy;  Laterality: N/A;   COLONOSCOPY N/A 01/01/2024   Procedure: COLONOSCOPY;  Surgeon: Cindie Carlin POUR, DO;  [facility]: [facility];  Service: Endoscopy;  Laterality: N/A;   ESOPHAGEAL DILATION N/A 10/13/2015    Procedure: ESOPHAGEAL DILATION;  Surgeon: Claudis RAYMOND Rivet, MD;  Location: [facility];  Service: Endoscopy;  Laterality: N/A;   ESOPHAGEAL DILATION N/A 12/03/2023   Procedure: DILATION, ESOPHAGUS;  Surgeon: Cindie Carlin POUR, DO;  [facility]: [facility];  Service: Endoscopy;  Laterality: N/A;   ESOPHAGOGASTRODUODENOSCOPY N/A 10/13/2015   Dr. Rivet: chronic gastritis on path, no H.pylori. Empiric dilation    ESOPHAGOGASTRODUODENOSCOPY N/A 12/18/2016   Dr. Harvey: mild gastritis. BRAVO study revealed uncontrolled GERD. Dysphagia secondary to uncontrolled reflux   ESOPHAGOGASTRODUODENOSCOPY N/A 12/03/2023   Procedure: EGD (ESOPHAGOGASTRODUODENOSCOPY);  Surgeon: Cindie Carlin POUR, DO;  [facility]: [facility];  Service: Endoscopy;  Laterality: N/A;   FOOT SURGERY Bilateral    nerve     INCISION AND DRAINAGE OF WOUND Left 10/30/2023   Procedure: IRRIGATION AND DEBRIDEMENT WOUND;  Surgeon: Arlinda Buster, MD;  Location: [facility] OR;  Service: Orthopedics;  Laterality: Left;  LEFT HAND WOUND   LEFT HEART CATH AND CORONARY ANGIOGRAPHY N/A 12/29/2018   Procedure: LEFT HEART CATH AND CORONARY ANGIOGRAPHY;  Surgeon: Dann Candyce RAMAN, MD;  Location: [facility] INVASIVE CV LAB;  Service: Cardiovascular;  Laterality: N/A;   LOWER EXTREMITY ANGIOGRAPHY Right 05/04/2022   Procedure: Lower Extremity Angiography;  Surgeon: Lanis Fonda BRAVO, MD;  Location: [facility] INVASIVE CV LAB;  Service: Cardiovascular;  Laterality: Right;   LUNG BIOPSY     MASS EXCISION Right 01/09/2013   Procedure: EXCISION OF NEOPLASM OF RIGHT  AXILLA  AND EXCISION OF NEOPLASM OF LEFT AXILLA;  Surgeon: Oneil DELENA Budge, MD;  Location: [facility];  Service: General;  Laterality: Right;  procedure end @ 08:23   MYRINGOTOMY WITH TUBE PLACEMENT Bilateral 04/28/2017   Procedure: BILATERAL MYRINGOTOMY WITH TUBE PLACEMENT;  Surgeon: Karis Clunes, MD;  Location: [facility] OR;  Service: ENT;  Laterality: Bilateral;   PERIPHERAL VASCULAR BALLOON ANGIOPLASTY Right 05/04/2022    Procedure: PERIPHERAL VASCULAR BALLOON ANGIOPLASTY;  Surgeon: Lanis Fonda BRAVO, MD;  Location: [facility] INVASIVE CV LAB;  Service: Cardiovascular;  Laterality: Right;  PT   PERIPHERAL VASCULAR INTERVENTION Right 05/04/2022   Procedure: PERIPHERAL VASCULAR INTERVENTION;  Surgeon: Lanis Fonda BRAVO, MD;  Location: [facility] INVASIVE CV LAB;  Service: Cardiovascular;  Laterality: Right;  SFA   PERIPHERAL VASCULAR INTERVENTION Left 07/21/2022   Procedure: PERIPHERAL VASCULAR INTERVENTION;  Surgeon: Serene Gaile ORN, MD;  Location: [facility];  Service: Cardiovascular;  Laterality: Left;   REVISION OF ARTERIOVENOUS GORETEX GRAFT Left 05/04/2018   Procedure: TRANSPOSITION OF CEPHALIC VEIN ARTERIOVENOUS FISTULA LEFT ARM;  Surgeon: Oris Krystal FALCON, MD;  Location: [facility] OR;  Service: Vascular;  Laterality: Left;   SAVORY DILATION N/A 12/18/2016   Procedure: SAVORY DILATION;  Surgeon: Harvey Margo CROME, MD;  [facility];  Service: Endoscopy;  Laterality: N/A;   TRANSMETATARSAL AMPUTATION Left 08/15/2022   Procedure: TRANSMETATARSAL AMPUTATION;  Surgeon: Silva Juliene SAUNDERS, DPM;  Location: [facility] OR;  Service: Podiatry;  Laterality: Left;   Social History:  reports that she has never smoked. She has never been exposed to tobacco smoke. She has never used smokeless tobacco. She reports that she does not drink alcohol and does not use drugs.  Allergies  Allergen Reactions   Ace Inhibitors Anaphylaxis and Swelling   Penicillins Itching and Swelling    Has tolerated cefazolin  on multiple occasions    Statins Other (See Comments)    Elevated LFT's   Albuterol  Swelling    Family History  Problem Relation Age of Onset   Hypertension Father    Hypercholesterolemia Father    Arthritis Father    Hypertension Sister    Hypercholesterolemia Sister    Breast cancer Sister    Hypertension Sister    Colon cancer Neg Hx    Colon polyps Neg Hx     Prior to Admission medications   Medication Sig Start Date End Date  Taking? Authorizing Provider  acetaminophen  (TYLENOL ) 500 MG tablet Take 1,000 mg by mouth every 6 (six) hours as needed for mild pain (pain score 1-3).   Yes [provider]  amLODipine  (NORVASC ) 10 MG tablet Take 0.5 tablets (5 mg total) by mouth daily. 10/28/23  Yes Emokpae, Courage, MD  ASPIRIN  LOW DOSE 81 MG chewable tablet Chew 162 mg by mouth daily. 01/05/24  Yes [provider]  B Complex-C-Folic Acid  (RENA-VITE RX) 1 MG TABS Take 1 tablet by mouth daily. 04/15/22  Yes [provider]  busPIRone  (BUSPAR ) 7.5 MG tablet Take 1 tablet (7.5 mg total) by mouth 2 (two) times daily. 11/24/23  Yes Antonetta Rollene BRAVO, MD  calcitRIOL  (ROCALTROL ) 0.5 MCG capsule Take 2 capsules (1 mcg total) by mouth every Monday, Wednesday, and Friday with hemodialysis. 11/05/23  Yes Amin, Ankit C, MD  cinacalcet  (SENSIPAR ) 30 MG tablet Take 3 tablets (90 mg total) by mouth every Monday, Wednesday, and Friday. 12/08/23  Yes Johnson, Clanford L, MD  ezetimibe  (ZETIA ) 10 MG tablet Take 1 tablet (10 mg total) by mouth daily. 04/27/23  Yes Antonetta Rollene BRAVO, MD  insulin  aspart (NOVOLOG ) 100 UNIT/ML injection Inject 0-6 Units into the skin 3 (three) times daily with meals. CBG 70 - 120: 0 units  CBG 121 - 150: 0 units  CBG 151 - 200: 1 unit  CBG 201-250: 2 units  CBG 251-300: 3 units  CBG 301-350: 4 units  CBG 351-400: 5 units  CBG > 400: Give 10 units and call MD 11/12/23  Yes Shahmehdi, Seyed A, MD  insulin  glargine, 2 Unit Dial , (TOUJEO  MAX SOLOSTAR) 300 UNIT/ML Solostar Pen Inject 30 Units into the skin daily. May need to slowly increase the dose depending upon your blood sugar, follow-up with PCP 01/01/23  Yes Trixie File, MD  metoprolol  succinate (TOPROL -XL) 50 MG 24 hr tablet TAKE ONE TABLET BY MOUTH ONCE DAILY WITH FOOD (TAKE AFTER DIALYSIS) 12/20/23  Yes Antonetta Rollene BRAVO, MD  ondansetron  (ZOFRAN ) 4 MG tablet Take 4 mg by mouth every 8 (eight) hours as needed for nausea or vomiting.  11/26/23  Yes [provider]  pantoprazole  (PROTONIX ) 40 MG tablet Take 1 tablet (40 mg total) by mouth daily. 12/08/23  Yes Johnson, Clanford L, MD  sertraline  (ZOLOFT ) 50 MG tablet Take 1.5 tablets (75 mg total) by mouth daily. Take one and a half tablets once daily 01/02/24  Yes Johnson, Clanford L, MD  torsemide  (DEMADEX ) 100 MG tablet Take 1 tablet (100 mg total) by mouth every Tuesday, Thursday, Saturday, and Sunday. Non-dialysis days 01/02/24  Yes Johnson, Clanford L, MD  [name]  500 MG chewable tablet Chew 1-2 tablets (500-1,000 mg total) by mouth See admin instructions. Take 1000mg  (2 tablets) by mouth with meals and 500mg  (1 tablet) with snacks 08/23/23  Yes Antonetta Rollene BRAVO, MD  nitroGLYCERIN  (NITRODUR - DOSED IN MG/24 HR) 0.2 mg/hr patch Place 1 patch (0.2 mg total) onto the skin daily. Patient not taking: Reported on 02/02/2024 12/28/23   Arlinda Buster, MD  FLUoxetine (PROZAC) 10 MG capsule Take 10 mg by mouth daily.    05/28/11  [provider]  glipiZIDE  (GLUCOTROL ) 10 MG tablet Take 10 mg by mouth 2 (two) times daily before a meal.    05/28/11  [provider]    Physical Exam: Vitals:   02/01/24 2012 02/02/24 0029 02/02/24 0317 02/02/24 0726  BP: (!) 170/90 (!) 165/78 (!) 158/78 (!) 155/77  Pulse: 81 68 62 69  Resp: 18 15 18 16   Temp: 98.1 F (36.7 C) 98 F (36.7 C) 98.1 F (36.7 C) 98 F (36.7 C)  TempSrc: Oral Oral Oral Oral  SpO2: 99% 98% 100% 95%   Physical Exam Constitutional:      Appearance: She is obese.  HENT:     Head: Normocephalic and atraumatic.     Nose: Nose normal.     Mouth/Throat:     Mouth: Mucous membranes are moist.  Eyes:     Pupils: Pupils are equal, round, and reactive  to light.  Cardiovascular:     Rate and Rhythm: Normal rate and regular rhythm.  Pulmonary:     Effort: Pulmonary effort is normal.  Abdominal:     Comments: Obese abdomen    Musculoskeletal:     Comments: S/p bilateral BKA   Skin:     Comments: See pictures    Psychiatric:        Mood and Affect: Mood normal.            Data Reviewed:   There are no new results to review at this time.  DG Hand Complete Left EXAM: 3 OR MORE VIEW(S) XRAY OF THE LEFT HAND 02/01/2024 10:32:00 PM  COMPARISON: 11/15/2023  CLINICAL HISTORY: known gangrene, maggot infested wound  FINDINGS:  BONES AND JOINTS: No acute fracture. No focal osseous lesion. No joint dislocation. Diffuse vascular calcifications. 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 No bone destruction seen to suggest acute osteomyelitis.  SOFT TISSUES: Soft tissue gas noted concerning for gas forming infection.  IMPRESSION: 1. Soft tissue gas concerning for gas-forming infection. 2. No radiographic evidence of acute osteomyelitis.  Electronically signed by: Franky Crease MD 02/01/2024 10:37 PM EST RP Workstation: [id number]  Lab Results  Component Value Date   WBC 11.5 (H) 02/02/2024   HGB 8.4 (L) 02/02/2024   HCT 28.1 (L) 02/02/2024   MCV 84.9 02/02/2024   PLT 483 (H) 02/02/2024   Last metabolic panel Lab Results  Component Value Date   GLUCOSE 114 (H) 02/02/2024   NA 142 02/02/2024   K 5.7 (H) 02/02/2024   CL 100 02/02/2024   CO2 26 02/02/2024   BUN 46 (H) 02/02/2024   CREATININE 4.51 (H) 02/02/2024   GFRNONAA 10 (L) 02/02/2024   CALCIUM  8.9 02/02/2024   PHOS 5.5 (H) 01/19/2024   PROT 6.8 02/01/2024   ALBUMIN  2.6 (L) 02/01/2024   LABGLOB 3.2 09/29/2022   AGRATIO 1.1 09/29/2022   BILITOT 0.5 02/01/2024   ALKPHOS 109 02/01/2024   AST 22 02/01/2024   ALT 22 02/01/2024   ANIONGAP 16 (H) 02/02/2024    Assessment and Plan: Dry gangrene (HCC) - left hand Worsening L hand gangrene with increasing foul smell and maggots present in the wound.  followed by hand surgeon (Anshul Estela) Agarwala, M.D with likely need for amputation in review of  prior outpatient documentation  Left hand plain film showing Soft tissue gas concerning for gas-forming infection.  Started on empiric Rocephin , Flagyl , vancomycin  Blood cultures Consult ortho  Follow-up recommendations    PAF (paroxysmal atrial fibrillation) (HCC) Rate controlled at present Off anticoagulation secondary to history of GI bleeding On baby aspirin  Hold perioperatively Monitor  ESRD on hemodialysis [facility]) Baseline ESRD on hemodialysis Monday Wednesday Friday Will consult Dr. Vassie for initiation of hemodialysis while in house  Chronic diastolic (congestive) heart failure (HCC) 2D ECHO 05/2023 w/ EF 55% and grade 1 DD Dialysis dependent volume status  Appears euvolemic  Monitor volume status closely   HTN (hypertension) BP stable Titrating regimen  OSA (obstructive sleep apnea) CPAP  History of GI bleeding Noted admission October 2017 through October 29 for anticoagulation associated GI bleed Resolved at present with no reports of further events discharge Hemoglobin stable 8.4 Monitor  Diabetes mellitus (HCC) Blood sugar in 60s while n.p.o. Hypoglycemia protocol Hold outpatient insulin  regimen Monitor      Advance Care Planning:   Code Status: Full Code  Consults: Orthopedics, Nephrology   Family Communication: Family at the bedside   Severity of Illness: The appropriate patient status for this patient is OBSERVATION. Observation status is judged to be reasonable and necessary in order to provide the required intensity of service to ensure the patient's safety. The patient's presenting symptoms, physical exam findings, and initial radiographic and laboratory data in the context of their medical condition is felt to place them at decreased risk for further clinical deterioration. Furthermore, it is anticipated that the patient will be medically stable for discharge from the hospital within 2 midnights of admission.   Author: Elspeth JINNY Masters,  MD 02/02/2024 1:37 PM  For on call review [email or web address].

## 2024-02-02 NOTE — ED Notes (Signed)
 Patient left and is soaking in wound care solution and saline and bedside.

## 2024-02-02 NOTE — Assessment & Plan Note (Signed)
 Noted admission October 2017 through October 29 for anticoagulation associated GI bleed Resolved at present with no reports of further events discharge Hemoglobin stable 8.4 Monitor

## 2024-02-02 NOTE — Assessment & Plan Note (Signed)
 Rate controlled at present Off anticoagulation secondary to history of GI bleeding On baby aspirin  Hold perioperatively Monitor

## 2024-02-02 NOTE — Progress Notes (Signed)
 The patient completed dialysis treatment without issue, and goal met.  02/02/24 1940  Vitals  Temp 98 F (36.7 C)  Temp Source Oral  BP 130/78  BP Location Right Arm  BP Method Automatic  Patient Position (if appropriate) Lying  Pulse Rate 72  ECG Heart Rate 72  Weight 69 kg  Type of Weight Post-Dialysis  During Treatment Monitoring  Intra-Hemodialysis Comments Tx completed  Post Treatment  Dialyzer Clearance Lightly streaked  Hemodialysis Intake (mL) 0 mL  Liters Processed 78  Fluid Removed (mL) 2000 mL  Tolerated HD Treatment Yes  Post-Hemodialysis Comments goal met.  AVG/AVF Arterial Site Held (minutes) 7 minutes  AVG/AVF Venous Site Held (minutes) 7 minutes  Fistula / Graft Left Upper arm Arteriovenous fistula  Placement Date/Time: 09/02/17 1150   Placed prior to admission: Yes  Orientation: Left  Access Location: Upper arm  Access Type: Arteriovenous fistula  Site Condition No complications  Fistula / Graft Assessment Present;Thrill;Bruit  Status Deaccessed  Needle Size 15  Drainage Description None

## 2024-02-02 NOTE — Progress Notes (Signed)
 Initial Nutrition Assessment  DOCUMENTATION CODES:   Not applicable  INTERVENTION:  Recommend Renal, 1200 ml diet  Encourage PO intake  Ensure Plus High Protein po BID, each supplement provides 350 kcal and 20 grams of protein. -1 packet Juven BID, each packet provides 95 calories, 2.5 grams of protein (collagen), and 9.8 grams of carbohydrate (3 grams sugar); also contains 7 grams of L-arginine and L-glutamine, 300 mg vitamin C , 15 mg vitamin E, 1.2 mcg vitamin B-12, 9.5 mg zinc , 200 mg calcium , and 1.5 g  Calcium  Beta-hydroxy-Beta-methylbutyrate to support wound healing  Start Renal MVI    NUTRITION DIAGNOSIS:   Increased nutrient needs related to wound healing as evidenced by  (gangrene LUE).   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  Patient seen in room, son at bedside. NPO today for potential surgical intervention for LUE, likely to need either I&D or amputation. Pt with bilateral BKAs.  Pt reports having a good appetite at home and does her best to include protein with each meal, particularly likes to eat salads especially before HD. Pt will sometimes drink either Nepro or Ensure shakes at dialysis, open to doing ensure while admitted as well as Juven packets to help promote wound healing. No N/V/C/D reported. Pt reports usual dry weight ~ 144 lbs, bedscale weight taken during visit and updated in chart. weight may be high due to need for HD session.   Admit/current weight: 74.2 kg  Weights in chart hx varying significantly from 63-84.8 kg over the past three months likely due to fluid  changes with HD. Per HD RN note on 12/04/23, post HD weight listed as 70.1 kg (154 lbs).  Nutritionally Relevant Medications:  SSI 0-6 units q 4 hrs, protonix     Labs Reviewed: K 5.7, Glu 114, BUN 46, Cr 4.51  NUTRITION - FOCUSED PHYSICAL EXAM:  Flowsheet Row Most Recent Value  Orbital Region No depletion  Upper Arm Region No depletion  Thoracic and Lumbar Region No depletion  Buccal Region No depletion  Temple Region No depletion  Clavicle Bone Region No depletion  Clavicle and Acromion Bone Region No depletion  Scapular Bone Region No depletion  Dorsal Hand Unable to assess  Patellar Region Mild depletion  Anterior Thigh Region Mild depletion  Posterior Calf Region Unable to assess  [bilateral BKA]  Hair Reviewed  Eyes Reviewed  Mouth Reviewed  Skin Reviewed  Nails Unable to assess    Diet Order:   Diet Order             Diet NPO time specified  Diet effective midnight           Diet renal with fluid restriction Fluid restriction: 1200 mL Fluid; Room service appropriate? Yes; Fluid consistency: Thin  Diet effective now                   EDUCATION NEEDS:   Education needs have been addressed  Skin:  Skin Assessment: Skin Integrity Issues: Skin Integrity Issues:: Other (Comment) Other: gangrene, left hand  Last BM:  PTA  Height:   Ht Readings from Last 1 Encounters:  01/24/24 5' 6 (1.676 m)    Weight:   Wt Readings from Last 1 Encounters:  02/02/24 71 kg     BMI:  Body mass index is 25.26 kg/m.  Estimated Nutritional Needs:   Kcal:  7999-7624  Protein:  89-111  Fluid:  1L + UOP  Kathryn Gangwer, MS, RD, LDN Clinical Dietitian  Contact via secure chat. If unavailable, use group chat RD Inpatient.

## 2024-02-02 NOTE — Assessment & Plan Note (Signed)
 CPAP.

## 2024-02-02 NOTE — Assessment & Plan Note (Signed)
 BP stable Titrating regimen

## 2024-02-02 NOTE — Assessment & Plan Note (Signed)
 Blood sugar in 60s while n.p.o. Hypoglycemia protocol Hold outpatient insulin  regimen Monitor

## 2024-02-02 NOTE — Assessment & Plan Note (Addendum)
 Worsening L hand gangrene with increasing foul smell and maggots present in the wound.  followed by hand surgeon (Anshul Estela) Agarwala, M.D with likely need for amputation in review of prior outpatient documentation  Left hand plain film showing Soft tissue gas concerning for gas-forming infection.  Started on empiric Rocephin , Flagyl , vancomycin  Blood cultures Consult ortho  Follow-up recommendations

## 2024-02-02 NOTE — ED Notes (Signed)
 Pt hand soaking in basin with

## 2024-02-02 NOTE — Consult Note (Signed)
 Reason for Consult:Left hand infection Referring Physician: Elspeth Masters Time called: 1246 Time at bedside: 1313   Kathryn Beck is an 68 y.o. female.  HPI: [name] developed increased pain and odor in her left hand starting Sunday. She then developed a wound on her palm that began draining. She was brought to the ED and was admitted. Hand surgery was consulted the following day.  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  Last Assessment & Plan:   Multiple polyps with surveillance due in 2021.  IMO SNOMED Dx Update Oct 2024     History of pneumonia 2019   Hypoxia 04/03/2019   Memory loss    Mixed hyperlipidemia    Not currently working due to disabled status 06/13/2018   Formatting of this note might be different from the original.  Last Assessment & Plan:   Formatting of this note might be different from the original.  Reports being  disabled since April/May 2019 , hospitalized then in a niursing home, now on dialysis and is unable to work     Obesity    Pancreatitis    Peritoneal dialysis catheter in place    Pneumonia due to COVID-19 virus 04/02/2019   Sleep apnea    Noncompliant with CPAP    Stroke (HCC)    mini stroke   Type 2 diabetes mellitus (HCC)     Past Surgical History:  Procedure Laterality Date   ABDOMINAL AORTOGRAM W/LOWER EXTREMITY N/A 04/30/2022   Procedure: ABDOMINAL AORTOGRAM W/LOWER EXTREMITY;  Surgeon: Magda Debby SAILOR, MD;  [facility]: [facility];  Service: Cardiovascular;  Laterality: N/A;   ABDOMINAL AORTOGRAM W/LOWER EXTREMITY N/A 07/21/2022   Procedure: ABDOMINAL AORTOGRAM W/LOWER EXTREMITY;  Surgeon: Serene Gaile ORN, MD;  Location: [facility];  Service: Cardiovascular;  Laterality: N/A;   ABDOMINAL HYSTERECTOMY     ACHILLES TENDON LENGTHENING  08/15/2022   Procedure: ACHILLES TENDON LENGTHENING;  Surgeon: Silva Juliene SAUNDERS, DPM;  Location: [facility] OR;  Service: Podiatry;;   AMPUTATION Right 05/29/2022   Procedure: RIGHT BELOW THE KNEE AMPUTATION;  Surgeon: Harden Jerona GAILS, MD;  Location: [facility] OR;  Service: Orthopedics;  Laterality: Right;   AMPUTATION Left 09/04/2022   Procedure: AMPUTATION FOOT, serial irrigation;  Surgeon: Joya Stabs, DPM;  Location: [facility] OR;  Service: Podiatry;  Laterality: Left;  Surgical team to do block   AMPUTATION Left 10/07/2022   Procedure: LEFT BELOW KNEE AMPUTATION;  Surgeon: Harden Jerona GAILS, MD;  Location: [facility] OR;  Service: Orthopedics;  Laterality: Left;   AMPUTATION  FINGER Left 09/29/2023   Procedure: AMPUTATION, FINGER;  Surgeon: Harden Jerona GAILS, MD;  Location: [facility] OR;  Service: Orthopedics;  Laterality: Left;  LEFT HAND LONG FINGER RAY AMPUTATION   AV FISTULA PLACEMENT Left 09/02/2017   Procedure: creation of left arm ARTERIOVENOUS (AV) FISTULA;  Surgeon: Serene Gaile ORN, MD;  Location: [facility] OR;  Service: Vascular;  Laterality: Left;   COLONOSCOPY  2008   Dr. Harvey: normal    COLONOSCOPY N/A 12/18/2016   Dr. Harvey: multiple tubular adenomas, internal hemorrhoids. Surveillance in 3 years    COLONOSCOPY N/A 12/05/2023   Procedure: COLONOSCOPY;  Surgeon: Cindie Carlin POUR, DO;  [facility]: [facility];  Service: Endoscopy;   Laterality: N/A;   COLONOSCOPY N/A 12/17/2023   Procedure: COLONOSCOPY;  Surgeon: Shaaron Lamar HERO, MD;  [facility]: [facility];  Service: Endoscopy;  Laterality: N/A;   COLONOSCOPY N/A 01/01/2024   Procedure: COLONOSCOPY;  Surgeon: Cindie Carlin POUR, DO;  [facility];  Service: Endoscopy;  Laterality: N/A;   ESOPHAGEAL DILATION N/A 10/13/2015   Procedure: ESOPHAGEAL DILATION;  Surgeon: Claudis RAYMOND Rivet, MD;  [facility];  Service: Endoscopy;  Laterality: N/A;   ESOPHAGEAL DILATION N/A 12/03/2023   Procedure: DILATION, ESOPHAGUS;  Surgeon: Cindie Carlin POUR, DO;  [facility];  Service: Endoscopy;  Laterality: N/A;   ESOPHAGOGASTRODUODENOSCOPY N/A 10/13/2015   Dr. Rivet: chronic gastritis on path, no H.pylori. Empiric dilation    ESOPHAGOGASTRODUODENOSCOPY N/A 12/18/2016   Dr. Harvey: mild gastritis. BRAVO study revealed uncontrolled GERD. Dysphagia secondary to uncontrolled reflux   ESOPHAGOGASTRODUODENOSCOPY N/A 12/03/2023   Procedure: EGD (ESOPHAGOGASTRODUODENOSCOPY);  Surgeon: Cindie Carlin POUR, DO;  [facility];  Service: Endoscopy;  Laterality: N/A;   FOOT SURGERY Bilateral    nerve     INCISION AND DRAINAGE OF WOUND Left 10/30/2023   Procedure: IRRIGATION AND DEBRIDEMENT WOUND;  Surgeon: Arlinda Buster, MD;  [facility]: [facility];  Service: Orthopedics;  Laterality: Left;  LEFT HAND WOUND   LEFT HEART CATH AND CORONARY ANGIOGRAPHY N/A 12/29/2018   Procedure: LEFT HEART CATH AND CORONARY ANGIOGRAPHY;  Surgeon: Dann Candyce RAMAN, MD;  Location: [facility] INVASIVE CV LAB;  Service: Cardiovascular;  Laterality: N/A;   LOWER EXTREMITY ANGIOGRAPHY Right 05/04/2022   Procedure: Lower Extremity Angiography;  Surgeon: Lanis Fonda BRAVO, MD;  Location: [facility] INVASIVE CV LAB;  Service: Cardiovascular;  Laterality: Right;   LUNG BIOPSY     MASS EXCISION Right 01/09/2013   Procedure: EXCISION OF NEOPLASM OF RIGHT  AXILLA  AND EXCISION OF NEOPLASM OF LEFT AXILLA;   Surgeon: Oneil DELENA Budge, MD;  Location: [facility] ORS;  Service: General;  Laterality: Right;  procedure end @ 08:23   MYRINGOTOMY WITH TUBE PLACEMENT Bilateral 04/28/2017   Procedure: BILATERAL MYRINGOTOMY WITH TUBE PLACEMENT;  Surgeon: Karis Clunes, MD;  Location: [facility] OR;  Service: ENT;  Laterality: Bilateral;   PERIPHERAL VASCULAR BALLOON ANGIOPLASTY Right 05/04/2022   Procedure: PERIPHERAL VASCULAR BALLOON ANGIOPLASTY;  Surgeon: Lanis Fonda BRAVO, MD;  Location: [facility] INVASIVE CV LAB;  Service: Cardiovascular;  Laterality: Right;  PT   PERIPHERAL VASCULAR INTERVENTION Right 05/04/2022   Procedure: PERIPHERAL VASCULAR INTERVENTION;  Surgeon: Lanis Fonda BRAVO, MD;  Location: [facility] INVASIVE CV LAB;  Service: Cardiovascular;  Laterality: Right;  SFA   PERIPHERAL VASCULAR INTERVENTION Left 07/21/2022   Procedure: PERIPHERAL VASCULAR INTERVENTION;  Surgeon: Serene Gaile ORN, MD;  [facility];  Service: Cardiovascular;  Laterality: Left;   REVISION OF ARTERIOVENOUS GORETEX GRAFT Left 05/04/2018  Procedure: TRANSPOSITION OF CEPHALIC VEIN ARTERIOVENOUS FISTULA LEFT ARM;  Surgeon: Oris Krystal FALCON, MD;  Location: [facility] OR;  Service: Vascular;  Laterality: Left;   SAVORY DILATION N/A 12/18/2016   Procedure: SAVORY DILATION;  Surgeon: Harvey Margo CROME, MD;  [facility];  Service: Endoscopy;  Laterality: N/A;   TRANSMETATARSAL AMPUTATION Left 08/15/2022   Procedure: TRANSMETATARSAL AMPUTATION;  Surgeon: Silva Juliene SAUNDERS, DPM;  Location: [facility] OR;  Service: Podiatry;  Laterality: Left;    Family History  Problem Relation Age of Onset   Hypertension Father    Hypercholesterolemia Father    Arthritis Father    Hypertension Sister    Hypercholesterolemia Sister    Breast cancer Sister    Hypertension Sister    Colon cancer Neg Hx    Colon polyps Neg Hx     Social History:  reports that she has never smoked. She has never been exposed to tobacco smoke. She has never used smokeless tobacco. She reports that  she does not drink alcohol and does not use drugs.  Allergies:  Allergies  Allergen Reactions   Ace Inhibitors Anaphylaxis and Swelling   Penicillins Itching and Swelling    Has tolerated cefazolin  on multiple occasions    Statins Other (See Comments)    Elevated LFT's   Albuterol  Swelling    Medications: I have reviewed the patient's current medications.  Results for orders placed or performed during the hospital encounter of 02/01/24 (from the past 48 hours)  Comprehensive metabolic panel     Status: Abnormal   Collection Time: 02/01/24 11:12 PM  Result Value Ref Range   Sodium 140 135 - 145 mmol/L   Potassium 5.6 (H) 3.5 - 5.1 mmol/L   Chloride 100 98 - 111 mmol/L   CO2 26 22 - 32 mmol/L   Glucose, Bld 163 (H) 70 - 99 mg/dL    Comment: Glucose reference range applies only to samples taken after fasting for at least 8 hours.   BUN 44 (H) 8 - 23 mg/dL   Creatinine, Ser 5.48 (H) 0.44 - 1.00 mg/dL   Calcium  9.0 8.9 - 10.3 mg/dL   Total Protein 6.8 6.5 - 8.1 g/dL   Albumin  2.6 (L) 3.5 - 5.0 g/dL   AST 22 15 - 41 U/L   ALT 22 0 - 44 U/L   Alkaline Phosphatase 109 38 - 126 U/L   Total Bilirubin 0.5 0.0 - 1.2 mg/dL   GFR, Estimated 10 (L) >60 mL/min    Comment: (NOTE) Calculated using the CKD-EPI Creatinine Equation (2021)    Anion gap 14 5 - 15    Comment: Performed at [facility] Lab, [address], [place], [place] [address]  CBC with Differential     Status: Abnormal   Collection Time: 02/01/24 11:12 PM  Result Value Ref Range   WBC 11.0 (H) 4.0 - 10.5 K/uL   RBC 3.34 (L) 3.87 - 5.11 MIL/uL   Hemoglobin 8.6 (L) 12.0 - 15.0 g/dL   HCT 70.7 (L) 63.9 - 53.9 %   MCV 87.4 80.0 - 100.0 fL   MCH 25.7 (L) 26.0 - 34.0 pg   MCHC 29.5 (L) 30.0 - 36.0 g/dL   RDW 81.5 (H) 88.4 - 84.4 %   Platelets 496 (H) 150 - 400 K/uL   nRBC 1.4 (H) 0.0 - 0.2 %   Neutrophils Relative % 82 %   Neutro Abs 8.9 (H) 1.7 - 7.7 K/uL   Lymphocytes Relative 11 %  Lymphs Abs 1.2 0.7 - 4.0 K/uL    Monocytes Relative 6 %   Monocytes Absolute 0.6 0.1 - 1.0 K/uL   Eosinophils Relative 0 %   Eosinophils Absolute 0.0 0.0 - 0.5 K/uL   Basophils Relative 0 %   Basophils Absolute 0.0 0.0 - 0.1 K/uL   Immature Granulocytes 1 %   Abs Immature Granulocytes 0.14 (H) 0.00 - 0.07 K/uL    Comment: Performed at [facility] Lab, [address], [place], [place] [address]  Protime-INR     Status: None   Collection Time: 02/01/24 11:12 PM  Result Value Ref Range   Prothrombin Time 14.7 11.4 - 15.2 seconds   INR 1.1 0.8 - 1.2    Comment: (NOTE) INR goal varies based on device and disease states. Performed at [facility] Lab, [address], [place], [place] [address]   Blood Culture (routine x 2)     Status: None (Preliminary result)   Collection Time: 02/01/24 11:12 PM   Specimen: BLOOD  Result Value Ref Range   Specimen Description BLOOD SITE NOT SPECIFIED    Special Requests      BOTTLES DRAWN AEROBIC AND ANAEROBIC Blood Culture adequate volume   Culture      NO GROWTH < 12 HOURS Performed at [facility] Lab, [address], [place], [place] [address]    Report Status PENDING   CG4 I-STAT (Lactic acid)     Status: None   Collection Time: 02/01/24 11:20 PM  Result Value Ref Range   Lactic Acid, Venous 1.3 0.5 - 1.9 mmol/L  Glucose, capillary     Status: Abnormal   Collection Time: 02/02/24  4:20 AM  Result Value Ref Range   Glucose-Capillary 102 (H) 70 - 99 mg/dL    Comment: Glucose reference range applies only to samples taken after fasting for at least 8 hours.  Sedimentation rate     Status: Abnormal   Collection Time: 02/02/24  4:44 AM  Result Value Ref Range   Sed Rate 72 (H) 0 - 22 mm/hr    Comment: Performed at [facility] Lab, [address], [place], [place] [address]  C-reactive protein     Status: Abnormal   Collection Time: 02/02/24  4:44 AM  Result Value Ref Range   CRP 17.4 (H) <1.0 mg/dL    Comment: Performed at [facility] Lab, [address], [place], [place] [address]  Prealbumin     Status: None   Collection Time: 02/02/24  4:44 AM  Result Value Ref Range   Prealbumin 22 18 - 38 mg/dL    Comment: Performed at [facility] Lab, [address] [place], [place] [address]  Basic metabolic panel     Status: Abnormal   Collection Time: 02/02/24  4:44 AM  Result Value Ref Range   Sodium 142 135 - 145 mmol/L   Potassium 5.7 (H) 3.5 - 5.1 mmol/L   Chloride 100 98 - 111 mmol/L   CO2 26 22 - 32 mmol/L   Glucose, Bld 114 (H) 70 - 99 mg/dL    Comment: Glucose reference range applies only to samples taken after fasting for at least 8 hours.   BUN 46 (H) 8 - 23 mg/dL   Creatinine, Ser 5.48 (H) 0.44 - 1.00 mg/dL   Calcium  8.9 8.9 - 10.3 mg/dL   GFR, Estimated 10 (L) >60 mL/min    Comment: (NOTE) Calculated using the CKD-EPI Creatinine Equation (2021)  Anion gap 16 (H) 5 - 15    Comment: Performed at [facility] Lab, [address] [place], [place] [address]  CBC     Status: Abnormal   Collection Time: 02/02/24  4:44 AM  Result Value Ref Range   WBC 11.5 (H) 4.0 - 10.5 K/uL   RBC 3.31 (L) 3.87 - 5.11 MIL/uL   Hemoglobin 8.4 (L) 12.0 - 15.0 g/dL   HCT 71.8 (L) 63.9 - 53.9 %   MCV 84.9 80.0 - 100.0 fL   MCH 25.4 (L) 26.0 - 34.0 pg   MCHC 29.9 (L) 30.0 - 36.0 g/dL   RDW 81.8 (H) 88.4 - 84.4 %   Platelets 483 (H) 150 - 400 K/uL   nRBC 0.9 (H) 0.0 - 0.2 %    Comment: Performed at [facility] Lab, [address], [place], [place] [address]  Glucose, capillary     Status: None   Collection Time: 02/02/24  8:07 AM  Result Value Ref Range   Glucose-Capillary 89 70 - 99 mg/dL    Comment: Glucose reference range applies only to samples taken after fasting for at least 8 hours.  Glucose, capillary     Status: Abnormal   Collection Time: 02/02/24 11:30 AM  Result Value Ref Range   Glucose-Capillary 62 (L) 70 - 99 mg/dL    Comment: Glucose reference range applies only to samples taken after fasting for at least 8 hours.    *Note: Due to a large number of results and/or encounters for the requested time period, some results have not been displayed. A complete set of results can be found in Results Review.    DG Hand Complete Left Result Date: 02/01/2024 EXAM: 3 OR MORE VIEW(S) XRAY OF THE LEFT HAND 02/01/2024 10:32:00 PM COMPARISON: 11/15/2023 CLINICAL HISTORY: known gangrene, maggot infested wound FINDINGS: BONES AND JOINTS: No acute fracture. No focal osseous lesion. No joint dislocation. Diffuse vascular calcifications. 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 No bone destruction seen to suggest acute osteomyelitis. SOFT TISSUES: Soft tissue gas noted concerning for gas forming infection. IMPRESSION: 1. Soft tissue gas concerning for gas-forming infection. 2. No radiographic evidence of acute osteomyelitis. Electronically signed by: Franky Crease MD 02/01/2024 10:37 PM EST RP Workstation: [id number]    Review of Systems  HENT:  Negative for ear discharge, ear pain, hearing loss and tinnitus.   Eyes:  Negative for photophobia and pain.  Respiratory:  Negative for cough and shortness of breath.   Cardiovascular:  Negative for chest pain.  Gastrointestinal:  Negative for abdominal pain, nausea and vomiting.  Genitourinary:  Negative for dysuria, flank pain, frequency and urgency.  Musculoskeletal:  Positive for arthralgias (Left hand). Negative for back pain, myalgias and neck pain.  Neurological:  Negative for dizziness and headaches.  Hematological:  Does not bruise/bleed easily.  Psychiatric/Behavioral:  The patient is not nervous/anxious.    Blood pressure (!) 155/77, pulse 69, temperature 98 F (36.7 C), temperature source Oral, resp. rate 16, SpO2 95%. Physical Exam Constitutional:      General: She is not in acute distress.    Appearance: She is well-developed. She is not diaphoretic.  HENT:     Head:  Normocephalic and atraumatic.  Eyes:     General: No scleral icterus.       Right eye: No discharge.        Left eye: No discharge.  Conjunctiva/sclera: Conjunctivae normal.  Cardiovascular:     Rate and Rhythm: Normal rate and regular rhythm.  Pulmonary:     Effort: Pulmonary effort is normal. No respiratory distress.  Musculoskeletal:     Cervical back: Normal range of motion.     Comments: Left shoulder, elbow, wrist, digits- Dorsal necrosis with necrotic ring finger, palmar base of ulnar hand with malodorous wound, purulent discharge, no instability, no blocks to motion  Sens  Ax/R/M/U intact  Mot   Thumb and index motion intact  Skin:    General: Skin is warm and dry.  Neurological:     Mental Status: She is alert.  Psychiatric:        Mood and Affect: Mood normal.        Behavior: Behavior normal.     Assessment/Plan: Left hand infection -- Dr. Arlinda to review and decide on plan which may include I&D or amputation.    Ozell DOROTHA Ned, PA-C Orthopedic Surgery [phone] 02/02/2024, 1:34 PM

## 2024-02-02 NOTE — Progress Notes (Signed)
 Pharmacy Antibiotic Note  GLANDA SPANBAUER is a 68 y.o. female with ESRD on HD MWF admitted on 02/01/2024 with cellulitis.  Pharmacy has been consulted for Vancomycin   dosing.  Plan: Vancomycin  750 mg IV after each HD     Temp (24hrs), Avg:98.1 F (36.7 C), Min:98 F (36.7 C), Max:98.1 F (36.7 C)  Recent Labs  Lab 02/01/24 2312  WBC 11.0*  CREATININE 4.51*    Estimated Creatinine Clearance: 11.3 mL/min (A) (by C-G formula based on SCr of 4.51 mg/dL (H)).    Allergies  Allergen Reactions   Ace Inhibitors Anaphylaxis and Swelling   Penicillins Itching and Swelling    Has tolerated cefazolin  on multiple occasions    Statins Other (See Comments)    Elevated LFT's   Albuterol  Swelling   Dail Cordella Misty 02/02/2024 2:48 AM

## 2024-02-03 ENCOUNTER — Telehealth: Payer: Self-pay | Admitting: *Deleted

## 2024-02-03 ENCOUNTER — Encounter (HOSPITAL_COMMUNITY): Payer: Self-pay | Admitting: Family Medicine

## 2024-02-03 ENCOUNTER — Inpatient Hospital Stay (HOSPITAL_COMMUNITY): Admitting: Certified Registered Nurse Anesthetist

## 2024-02-03 ENCOUNTER — Inpatient Hospital Stay (HOSPITAL_COMMUNITY)

## 2024-02-03 ENCOUNTER — Encounter (HOSPITAL_COMMUNITY)
Admission: EM | Disposition: A | Discharge status: Home Health | Payer: Self-pay | Source: Home / Self Care | Attending: Internal Medicine

## 2024-02-03 ENCOUNTER — Inpatient Hospital Stay: Admitting: Internal Medicine

## 2024-02-03 ENCOUNTER — Ambulatory Visit (HOSPITAL_BASED_OUTPATIENT_CLINIC_OR_DEPARTMENT_OTHER): Admitting: Pulmonary Disease

## 2024-02-03 DIAGNOSIS — N186 End stage renal disease: Secondary | ICD-10-CM

## 2024-02-03 DIAGNOSIS — Z89202 Acquired absence of left upper limb, unspecified level: Secondary | ICD-10-CM

## 2024-02-03 DIAGNOSIS — Z48812 Encounter for surgical aftercare following surgery on the circulatory system: Secondary | ICD-10-CM

## 2024-02-03 DIAGNOSIS — I132 Hypertensive heart and chronic kidney disease with heart failure and with stage 5 chronic kidney disease, or end stage renal disease: Secondary | ICD-10-CM

## 2024-02-03 DIAGNOSIS — I509 Heart failure, unspecified: Secondary | ICD-10-CM | POA: Diagnosis not present

## 2024-02-03 DIAGNOSIS — E1122 Type 2 diabetes mellitus with diabetic chronic kidney disease: Secondary | ICD-10-CM

## 2024-02-03 DIAGNOSIS — Z89512 Acquired absence of left leg below knee: Secondary | ICD-10-CM

## 2024-02-03 DIAGNOSIS — Z89511 Acquired absence of right leg below knee: Secondary | ICD-10-CM | POA: Diagnosis not present

## 2024-02-03 DIAGNOSIS — Z992 Dependence on renal dialysis: Secondary | ICD-10-CM | POA: Diagnosis not present

## 2024-02-03 DIAGNOSIS — I96 Gangrene, not elsewhere classified: Secondary | ICD-10-CM | POA: Diagnosis not present

## 2024-02-03 HISTORY — PX: INSERTION OF DIALYSIS CATHETER: SHX1324

## 2024-02-03 HISTORY — PX: LIGATION OF ARTERIOVENOUS  FISTULA: SHX5948

## 2024-02-03 HISTORY — PX: AMPUTATION: SHX166

## 2024-02-03 LAB — CBC
HCT: 28.4 % — ABNORMAL LOW (ref 36.0–46.0)
Hemoglobin: 8.7 g/dL — ABNORMAL LOW (ref 12.0–15.0)
MCH: 25.6 pg — ABNORMAL LOW (ref 26.0–34.0)
MCHC: 30.6 g/dL (ref 30.0–36.0)
MCV: 83.5 fL (ref 80.0–100.0)
Platelets: 457 K/uL — ABNORMAL HIGH (ref 150–400)
RBC: 3.4 MIL/uL — ABNORMAL LOW (ref 3.87–5.11)
RDW: 18 % — ABNORMAL HIGH (ref 11.5–15.5)
WBC: 11 K/uL — ABNORMAL HIGH (ref 4.0–10.5)
nRBC: 1.1 % — ABNORMAL HIGH (ref 0.0–0.2)

## 2024-02-03 LAB — BASIC METABOLIC PANEL WITH GFR
Anion gap: 13 (ref 5–15)
BUN: 25 mg/dL — ABNORMAL HIGH (ref 8–23)
CO2: 27 mmol/L (ref 22–32)
Calcium: 8.6 mg/dL — ABNORMAL LOW (ref 8.9–10.3)
Chloride: 96 mmol/L — ABNORMAL LOW (ref 98–111)
Creatinine, Ser: 2.98 mg/dL — ABNORMAL HIGH (ref 0.44–1.00)
GFR, Estimated: 17 mL/min — ABNORMAL LOW (ref >60)
Glucose, Bld: 82 mg/dL (ref 70–99)
Potassium: 4 mmol/L (ref 3.5–5.1)
Sodium: 136 mmol/L (ref 135–145)

## 2024-02-03 LAB — GLUCOSE, CAPILLARY
Glucose-Capillary: 102 mg/dL — ABNORMAL HIGH (ref 70–99)
Glucose-Capillary: 104 mg/dL — ABNORMAL HIGH (ref 70–99)
Glucose-Capillary: 105 mg/dL — ABNORMAL HIGH (ref 70–99)
Glucose-Capillary: 66 mg/dL — ABNORMAL LOW (ref 70–99)
Glucose-Capillary: 66 mg/dL — ABNORMAL LOW (ref 70–99)
Glucose-Capillary: 72 mg/dL (ref 70–99)
Glucose-Capillary: 75 mg/dL (ref 70–99)
Glucose-Capillary: 76 mg/dL (ref 70–99)
Glucose-Capillary: 78 mg/dL (ref 70–99)
Glucose-Capillary: 81 mg/dL (ref 70–99)
Glucose-Capillary: 89 mg/dL (ref 70–99)

## 2024-02-03 LAB — SURGICAL PCR SCREEN
MRSA, PCR: NEGATIVE
Staphylococcus aureus: POSITIVE — AB

## 2024-02-03 LAB — HEPATITIS B SURFACE ANTIBODY, QUANTITATIVE: Hep B S AB Quant (Post): 461 m[IU]/mL

## 2024-02-03 SURGERY — AMPUTATION, HAND
Anesthesia: General | Site: Hand

## 2024-02-03 MED ORDER — ONDANSETRON HCL 4 MG/2ML IJ SOLN
INTRAMUSCULAR | Status: DC | PRN
  Administered 2024-02-03: 4 mg via INTRAVENOUS

## 2024-02-03 MED ORDER — LIDOCAINE 2% (20 MG/ML) 5 ML SYRINGE
INTRAMUSCULAR | Status: DC | PRN
Start: 2024-02-03 — End: 2024-02-03
  Administered 2024-02-03: 60 mg via INTRAVENOUS

## 2024-02-03 MED ORDER — HEPARIN 6000 UNIT IRRIGATION SOLUTION
Status: AC
  Filled 2024-02-03: qty 500

## 2024-02-03 MED ORDER — PROPOFOL 10 MG/ML IV BOLUS
INTRAVENOUS | Status: AC
Start: 2024-02-03 — End: 2024-02-03
  Filled 2024-02-03: qty 20

## 2024-02-03 MED ORDER — PHENYLEPHRINE HCL-NACL 20-0.9 MG/250ML-% IV SOLN
INTRAVENOUS | Status: DC | PRN
  Administered 2024-02-03: 25 ug/min via INTRAVENOUS

## 2024-02-03 MED ORDER — SUCCINYLCHOLINE CHLORIDE 200 MG/10ML IV SOSY
PREFILLED_SYRINGE | INTRAVENOUS | Status: DC | PRN
  Administered 2024-02-03: 100 mg via INTRAVENOUS

## 2024-02-03 MED ORDER — SUGAMMADEX SODIUM 200 MG/2ML IV SOLN
INTRAVENOUS | Status: DC | PRN
Start: 2024-02-03 — End: 2024-02-03
  Administered 2024-02-03: 138 mg via INTRAVENOUS

## 2024-02-03 MED ORDER — PROPOFOL 10 MG/ML IV BOLUS
INTRAVENOUS | Status: DC | PRN
Start: 2024-02-03 — End: 2024-02-03
  Administered 2024-02-03 (×2): 100 mg via INTRAVENOUS

## 2024-02-03 MED ORDER — FENTANYL CITRATE (PF) 100 MCG/2ML IJ SOLN
INTRAMUSCULAR | Status: AC
  Filled 2024-02-03: qty 2

## 2024-02-03 MED ORDER — FENTANYL CITRATE (PF) 100 MCG/2ML IJ SOLN: 25.0000 ug | INTRAMUSCULAR | Status: DC | PRN

## 2024-02-03 MED ORDER — ACETAMINOPHEN 10 MG/ML IV SOLN: 1000.0000 mg | Freq: Once | INTRAVENOUS | Status: DC | PRN

## 2024-02-03 MED ORDER — DEXTROSE 10 % IV SOLN: INTRAVENOUS | Status: DC

## 2024-02-03 MED ORDER — HEPARIN 6000 UNIT IRRIGATION SOLUTION
Status: DC | PRN
  Administered 2024-02-03: 1

## 2024-02-03 MED ORDER — ONDANSETRON HCL 4 MG/2ML IJ SOLN
INTRAMUSCULAR | Status: AC
  Filled 2024-02-03: qty 2

## 2024-02-03 MED ORDER — ROCURONIUM BROMIDE 100 MG/10ML IV SOLN
INTRAVENOUS | Status: DC | PRN
  Administered 2024-02-03: 20 mg via INTRAVENOUS

## 2024-02-03 MED ORDER — CEFAZOLIN SODIUM-DEXTROSE 2-4 GM/100ML-% IV SOLN
INTRAVENOUS | Status: AC
  Filled 2024-02-03: qty 100

## 2024-02-03 MED ORDER — PHENYLEPHRINE 80 MCG/ML (10ML) SYRINGE FOR IV PUSH (FOR BLOOD PRESSURE SUPPORT)
PREFILLED_SYRINGE | INTRAVENOUS | Status: AC
  Filled 2024-02-03: qty 50

## 2024-02-03 MED ORDER — ROCURONIUM BROMIDE 10 MG/ML (PF) SYRINGE
PREFILLED_SYRINGE | INTRAVENOUS | Status: AC
  Filled 2024-02-03: qty 10

## 2024-02-03 MED ORDER — MUPIROCIN 2 % EX OINT
1.0000 | TOPICAL_OINTMENT | Freq: Two times a day (BID) | CUTANEOUS | Status: DC
  Administered 2024-02-03 – 2024-02-06 (×7): 1 via NASAL
  Filled 2024-02-03: qty 22

## 2024-02-03 MED ORDER — CHLORHEXIDINE GLUCONATE 0.12 % MT SOLN
OROMUCOSAL | Status: AC
  Administered 2024-02-03: 15 mL via OROMUCOSAL
  Filled 2024-02-03: qty 15

## 2024-02-03 MED ORDER — SODIUM CHLORIDE 0.9 % IV SOLN: INTRAVENOUS | Status: DC

## 2024-02-03 MED ORDER — HEPARIN SODIUM (PORCINE) 1000 UNIT/ML IJ SOLN
INTRAMUSCULAR | Status: AC
  Filled 2024-02-03: qty 10

## 2024-02-03 MED ORDER — ORAL CARE MOUTH RINSE: 15.0000 mL | Freq: Once | OROMUCOSAL | Status: AC

## 2024-02-03 MED ORDER — AMISULPRIDE (ANTIEMETIC) 5 MG/2ML IV SOLN: 10.0000 mg | Freq: Once | INTRAVENOUS | Status: DC | PRN

## 2024-02-03 MED ORDER — LIDOCAINE 2% (20 MG/ML) 5 ML SYRINGE
INTRAMUSCULAR | Status: AC
  Filled 2024-02-03: qty 5

## 2024-02-03 MED ORDER — CHLORHEXIDINE GLUCONATE 0.12 % MT SOLN: 15.0000 mL | Freq: Once | OROMUCOSAL | Status: AC

## 2024-02-03 MED ORDER — ONDANSETRON HCL 4 MG/2ML IJ SOLN: 4.0000 mg | Freq: Once | INTRAMUSCULAR | Status: DC | PRN

## 2024-02-03 MED ORDER — LIDOCAINE-EPINEPHRINE (PF) 1 %-1:200000 IJ SOLN
INTRAMUSCULAR | Status: AC
  Filled 2024-02-03: qty 30

## 2024-02-03 MED ORDER — FENTANYL CITRATE (PF) 250 MCG/5ML IJ SOLN
INTRAMUSCULAR | Status: DC | PRN
  Administered 2024-02-03: 100 ug via INTRAVENOUS

## 2024-02-03 MED ORDER — 0.9 % SODIUM CHLORIDE (POUR BTL) OPTIME
TOPICAL | Status: DC | PRN
  Administered 2024-02-03: 1000 mL

## 2024-02-03 MED ORDER — EPHEDRINE SULFATE-NACL 50-0.9 MG/10ML-% IV SOSY
PREFILLED_SYRINGE | INTRAVENOUS | Status: DC | PRN
Start: 2024-02-03 — End: 2024-02-03
  Administered 2024-02-03: 10 mg via INTRAVENOUS

## 2024-02-03 MED ORDER — HEPARIN SODIUM (PORCINE) 1000 UNIT/ML IJ SOLN
INTRAMUSCULAR | Status: DC | PRN
  Administered 2024-02-03: 4000 [IU] via INTRAVENOUS

## 2024-02-03 SURGICAL SUPPLY — 44 items
BAG COUNTER SPONGE SURGICOUNT (BAG) ×3 IMPLANT
BIOPATCH RED 1 DISK 7.0 (GAUZE/BANDAGES/DRESSINGS) IMPLANT
BLADE OSCILLATING /SAGITTAL (BLADE) IMPLANT
BLADE SAW SGTL 81X20 HD (BLADE) IMPLANT
BNDG COHESIVE 6X5 TAN NS LF (GAUZE/BANDAGES/DRESSINGS) IMPLANT
BNDG ELASTIC 4INX 5YD STR LF (GAUZE/BANDAGES/DRESSINGS) IMPLANT
BNDG ELASTIC 4X5.8 VLCR STR LF (GAUZE/BANDAGES/DRESSINGS) ×3 IMPLANT
BNDG GAUZE DERMACEA FLUFF 4 (GAUZE/BANDAGES/DRESSINGS) ×3 IMPLANT
CATH PALINDROME-P 19CM W/VT (CATHETERS) IMPLANT
CORD BIPOLAR FORCEPS 12FT (ELECTRODE) ×3 IMPLANT
COVER SURGICAL LIGHT HANDLE (MISCELLANEOUS) IMPLANT
DERMABOND ADVANCED .7 DNX12 (GAUZE/BANDAGES/DRESSINGS) IMPLANT
DRAPE C-ARM 42X72 X-RAY (DRAPES) IMPLANT
DRAPE SURG ORHT 6 SPLT 77X108 (DRAPES) IMPLANT
DRSG ADAPTIC 3X8 NADH LF (GAUZE/BANDAGES/DRESSINGS) ×3 IMPLANT
FACESHIELD WRAPAROUND OR TEAM (MASK) IMPLANT
GAUZE SPONGE 4X4 12PLY STRL (GAUZE/BANDAGES/DRESSINGS) ×3 IMPLANT
GAUZE XEROFORM 1X8 LF (GAUZE/BANDAGES/DRESSINGS) ×3 IMPLANT
GLOVE BIOGEL M 8.0 STRL (GLOVE) ×3 IMPLANT
GOWN STRL REUS W/ TWL LRG LVL3 (GOWN DISPOSABLE) ×3 IMPLANT
GOWN STRL REUS W/ TWL XL LVL3 (GOWN DISPOSABLE) ×3 IMPLANT
GRAFT MYRIAD 3 LAYER 5X5 (Graft) IMPLANT
KIT BASIN OR (CUSTOM PROCEDURE TRAY) ×3 IMPLANT
KIT TURNOVER KIT B (KITS) ×3 IMPLANT
PACK ORTHO EXTREMITY (CUSTOM PROCEDURE TRAY) ×3 IMPLANT
PAD ARMBOARD POSITIONER FOAM (MISCELLANEOUS) ×6 IMPLANT
PAD CAST 4YDX4 CTTN HI CHSV (CAST SUPPLIES) ×3 IMPLANT
PADDING CAST COTTON 6X4 STRL (CAST SUPPLIES) IMPLANT
PENCIL BUTTON HOLSTER BLD 10FT (ELECTRODE) IMPLANT
SET MICROPUNCTURE 5F STIFF (MISCELLANEOUS) IMPLANT
SOAP 2 % CHG 4 OZ (WOUND CARE) ×3 IMPLANT
SOLN 0.9% NACL POUR BTL 1000ML (IV SOLUTION) ×3 IMPLANT
SOLN STERILE WATER BTL 1000 ML (IV SOLUTION) ×3 IMPLANT
SPONGE T-LAP 18X18 ~~LOC~~+RFID (SPONGE) ×3 IMPLANT
SPONGE T-LAP 4X18 ~~LOC~~+RFID (SPONGE) ×3 IMPLANT
STOCKINETTE IMPERVIOUS LG (DRAPES) IMPLANT
STRIP CLOSURE SKIN 1/2X4 (GAUZE/BANDAGES/DRESSINGS) IMPLANT
SUT ETHILON 3 0 PS 1 (SUTURE) IMPLANT
SUT SILK 3 0SH CR/8 30 (SUTURE) IMPLANT
SUT VIC AB 3-0 SH 27X BRD (SUTURE) IMPLANT
TOWEL GREEN STERILE (TOWEL DISPOSABLE) ×3 IMPLANT
TOWEL GREEN STERILE FF (TOWEL DISPOSABLE) ×3 IMPLANT
TUBE CONNECTING 12X1/4 (SUCTIONS) ×3 IMPLANT
YANKAUER SUCT BULB TIP NO VENT (SUCTIONS) ×3 IMPLANT

## 2024-02-03 NOTE — Transfer of Care (Signed)
 Immediate Anesthesia Transfer of Care Note  Patient: Kathryn Beck  Procedure(s) Performed: AMPUTATION, HAND (Left: Hand) INSERTION OF DIALYSIS CATHETER LIGATION OF ARTERIOVENOUS  FISTULA (Left)  Patient Location: PACU  Anesthesia Type:General  Level of Consciousness: drowsy  Airway & Oxygen  Therapy: Patient Spontanous Breathing and Patient connected to face mask oxygen   Post-op Assessment: Report given to RN and Post -op Vital signs reviewed and stable  Post vital signs: Reviewed and stable  Last Vitals:  Vitals Value Taken Time  BP 151/76 02/03/24 19:15  Temp 36.6 C 02/03/24 19:07  Pulse 52 02/03/24 19:21  Resp 15 02/03/24 19:21  SpO2 98 % 02/03/24 19:21  Vitals shown include unfiled device data.  Last Pain:  Vitals:   02/03/24 1915  TempSrc:   PainSc: Asleep         Complications: No notable events documented.

## 2024-02-03 NOTE — Assessment & Plan Note (Signed)
 2D ECHO 05/2023 w/ EF 55% and grade 1 DD Dialysis dependent volume status  Appears euvolemic  Monitor volume status closely

## 2024-02-03 NOTE — Progress Notes (Addendum)
 [facility] Progress Note   Subjective:   Seen in room - no CP/dyspnea. Ongoing L hand pain - for additional surgery today.   Objective Vitals:   02/02/24 2327 02/03/24 0322 02/03/24 0725 02/03/24 1117  BP: (!) 167/87 (!) 160/71 (!) 151/67 136/66  Pulse: 75 (!) 57 64 (!) 58  Resp: 15 18 16 16   Temp: 97.6 F (36.4 C) (!) 97.5 F (36.4 C) 97.7 F (36.5 C) 97.8 F (36.6 C)  TempSrc:  Oral Oral Oral  SpO2: 95% 97% 100% 97%  Weight:       Physical Exam General: Well appearing, NAD. Room air Heart: RRR Lungs: CTAB Abdomen: soft Extremities: B BKA, no stump edema. L hand bandaged - ischemic/gangrenous finger tip poking out Dialysis Access:  LUE AVF +t/b  Additional Objective Labs: Basic Metabolic Panel: Recent Labs  Lab 02/02/24 0444 02/02/24 1502 02/03/24 0442  NA 142 140 136  K 5.7* 5.3* 4.0  CL 100 98 96*  CO2 26 26 27   GLUCOSE 114* 76 82  BUN 46* 52* 25*  CREATININE 4.51* 5.03* 2.98*  CALCIUM  8.9 8.9 8.6*  PHOS  --  5.3*  --    Liver Function Tests: Recent Labs  Lab 02/01/24 2312 02/02/24 1502  AST 22  --   ALT 22  --   ALKPHOS 109  --   BILITOT 0.5  --   PROT 6.8  --   ALBUMIN  2.6* 2.4*   CBC: Recent Labs  Lab 02/01/24 2312 02/02/24 0444 02/02/24 1502 02/03/24 0442  WBC 11.0* 11.5* 11.0* 11.0*  NEUTROABS 8.9*  --   --   --   HGB 8.6* 8.4* 8.1* 8.7*  HCT 29.2* 28.1* 26.8* 28.4*  MCV 87.4 84.9 84.8 83.5  PLT 496* 483* 470* 457*   Studies/Results: DG Hand Complete Left Result Date: 02/01/2024 EXAM: 3 OR MORE VIEW(S) XRAY OF THE LEFT HAND 02/01/2024 10:32:00 PM COMPARISON: 11/15/2023 CLINICAL HISTORY: known gangrene, maggot infested wound FINDINGS: BONES AND JOINTS: No acute fracture. No focal osseous lesion. No joint dislocation. Diffuse vascular calcifications. 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 No bone destruction seen to  suggest acute osteomyelitis. SOFT TISSUES: Soft tissue gas noted concerning for gas forming infection. IMPRESSION: 1. Soft tissue gas concerning for gas-forming infection. 2. No radiographic evidence of acute osteomyelitis. Electronically signed by: Kevin Dover MD 02/01/2024 10:37 PM EST RP Workstation: [id number]   Medications:   ceFAZolin  (ANCEF ) IV     cefTRIAXone  (ROCEPHIN )  IV Stopped (02/02/24 2245)   metronidazole  Stopped (02/03/24 0020)   vancomycin       amLODipine   5 mg Oral Daily   busPIRone   7.5 mg Oral BID   calcitRIOL   1 mcg Oral Q M,W,F-HD   chlorhexidine   60 mL Topical Once   Chlorhexidine  Gluconate Cloth  6 each Topical Q0600   cinacalcet   90 mg Oral Q M,W,F   heparin   5,000 Units Subcutaneous Q8H   insulin  aspart  0-6 Units Subcutaneous Q4H   metoprolol  succinate  50 mg Oral Daily   mupirocin  ointment  1 Application Nasal BID   pantoprazole   40 mg Oral Daily   povidone-iodine   2 Application Topical Once   sertraline   75 mg Oral Daily   sodium chloride  flush  3 mL Intravenous Q12H   torsemide   100 mg Oral Q T,Th,S,Su    Dialysis Orders: MWF - [facility] 3.5hr, 400/500, EDW  70.5kg, 2K/2.5Ca bath, AVF, no heparin  - Mircera 100mcg Iv q 2 weeks (last given on 11/3)  Assessment/Plan: 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 Imaging concerning for gas-forming infection. Ortho involved, for further amputation today. On IV Vanc, Ceftriaxone , Flagyl . Has LUE AVF - doesn't appear vascular has been involved with her care, concerning for steal syndrome, I have consulted vascular - may need to ligate AVF to better perfuse the hand to attempt to heal her wounds. Disc with family - they are agreeable with consult. ESRD: Usual MWF schedule - next HD tomorrow. HTN/volume: BP variable, no LE edema. UF as tolerated, continue home meds. 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 Secondary HPTH: CorrCa and Phos ok, continue home  binders/VDRA. Nutrition: Adding protein supps for wound healing. T2DM   Kathryn Beck, Kathryn Beck 02/03/2024, 11:35 AM  [employer]

## 2024-02-03 NOTE — Anesthesia Postprocedure Evaluation (Signed)
 Anesthesia Post Note  Patient: Kathryn Beck  Procedure(s) Performed: AMPUTATION, HAND (Left: Hand) INSERTION OF DIALYSIS CATHETER LIGATION OF ARTERIOVENOUS  FISTULA (Left)     Patient location during evaluation: PACU Anesthesia Type: General Level of consciousness: awake and alert Pain management: pain level controlled Vital Signs Assessment: post-procedure vital signs reviewed and stable Respiratory status: spontaneous breathing, nonlabored ventilation, respiratory function stable and patient connected to nasal cannula oxygen  Cardiovascular status: blood pressure returned to baseline and stable Postop Assessment: no apparent nausea or vomiting Anesthetic complications: no   No notable events documented.  Last Vitals:  Vitals:   02/03/24 2000 02/03/24 2019  BP: (!) 140/71 (!) 149/71  Pulse: (!) 53 (!) 55  Resp: 15 16  Temp: 36.4 C 36.4 C  SpO2: 92% 96%    Last Pain:  Vitals:   02/03/24 2217  TempSrc:   PainSc: 9                  Lynwood MARLA Cornea

## 2024-02-03 NOTE — Telephone Encounter (Signed)
-----   Message from Nurse Luke BROCKS sent at 02/03/2024 10:34 AM EST ----- Regarding: Cancel Good morning. We need to reschedule Kathryn Beck. She is in the hospital in [place] and going to have a partial amputation of her left hand due to an infection.

## 2024-02-03 NOTE — Telephone Encounter (Addendum)
Message sent to endo to cancel for now

## 2024-02-03 NOTE — Consult Note (Signed)
   Kathryn Beck - 68 y.o. female MRN [id number]  Date of birth: 1955/07/15    Subjective:  68 year old female with ongoing gangrenous infection of left hand.  She has been followed for past multiple weeks for ongoing demarcation of necrosis in the left hand with the effort to maintain as much of the upper extremity moving forward.   Objective:   VITALS:   Vitals:   02/02/24 1940 02/02/24 1956 02/02/24 2327 02/03/24 0322  BP: 130/78 (!) 158/73 (!) 167/87 (!) 160/71  Pulse: 72 79 75 (!) 57  Resp:  18 15 18   Temp: 98 F (36.7 C) (!) 97.1 F (36.2 C) 97.6 F (36.4 C) (!) 97.5 F (36.4 C)  TempSrc: Oral   Oral  SpO2:  96% 95% 97%  Weight: 69 kg       Left hand: - prior incisional sites from previous ray resection with ongoing necrotic edges, ring finger with ongoing gangrene, devitalized tissue - notable discoloration at the ring and small finger with odor and drainage  - dehiscence of tissue both volarly and dorsally tracking into the palmar aspect of hand, no interval healing or consolidation of soft tissue    Lab Results  Component Value Date   WBC 11.0 (H) 02/03/2024   HGB 8.7 (L) 02/03/2024   HCT 28.4 (L) 02/03/2024   MCV 83.5 02/03/2024   PLT 457 (H) 02/03/2024     Assessment/Plan:     68 year old female with ongoing gangrenous infection of left hand with notable demarcation of tissue to the ring and small finger  Unfortunately due to her underlying co-morbidities and notable lack of blood flow, patient has been unable to clear infection from the left hand despite prior irrigation and debridement efforts   At this juncture, she is indicated for left hand partial amputation with ray resection of the ring and small finger for source control. Extensive discussion has been had with patient and family to preserve any portions of the hand that remain viable, for now will try to preserve thumb and index finger.   Plan for OR today   Nain Rudd, MD [place],  [facility] 02/03/2024, 7:08 AM

## 2024-02-03 NOTE — Telephone Encounter (Signed)
 Per Dr. Cindie, once discharged from hospital, have come in for OV in 4 weeks after.

## 2024-02-03 NOTE — Progress Notes (Signed)
 PROGRESS NOTE  IMAGINE NEST  DOB: Jul 27, 1955  PCP: Antonetta Rollene BRAVO, MD FMW:[id number]  DOA: 02/01/2024  LOS: 1 day  Hospital Day: 3  Subjective: Patient was seen and examined this morning. Pleasant elderly African-American female.  Propped up in bed.  Not in distress.  Daughter at bedside. Seen by nephrology later this morning. Also seen by vascular surgery because of concern of steal syndrome from AVF  Brief narrative: Kathryn Beck is a 68 y.o. female with PMH significant for ESRD-HD-MWF, DM2, HTN, HLD, OSA, A-fib no longer on anticoagulation because of GI bleed, CVA, PAD, bilateral BKA status, acquired thrombophilia who also has a left upper extremity dry gangrene that was being monitored by hand surgeon Dr. Brutus as an outpatient.  The gangrene margins have been fairly dry.  However for the last few days, family has noted worsening foul smell as well as maggots present in the inner finger creases.  No reported trauma to the affected area.  Sugars have been high at home.   In the ED, afebrile, hemodynamically stable WBC count 11 Blood cultures drawn CRP 17.4.  Sed rate 72.   Left hand x-ray showing soft tissue gas concerning for gas-forming infection.  Otherwise no radiographic evidence for osteo myelitis.   Started on broad-spectrum antibiotics  Admitted to [facility] Hand surgery and nephrology consulted  Assessment and plan: Left hand infection  Pre-existing left hand dry gangrene  Presented with foul-smelling drainage and maggots present in the wound  Seen by hand surgery Dr. Gildardo Alderton, Plan for partial amputation today.  Remains n.p.o. Continue broad-spectrum IV antibiotics Pending culture report vascular surgery was involved by nephrology because of concern of steal syndrome from AVF Pain regimen --- Scheduled:  --- PRN: Tylenol , oxycodone , fentanyl    ESRD-HD-MWF  Nephrology following.   Underwent last dialysis on 11/12 in hospital    Chronic diastolic  CHF HTN 2D ECHO 05/2023 w/ EF 55% and grade 1 DD Dialysis dependent volume status  Appears euvolemic  Monitor volume status closely  PTA meds-Toprol  50 mg daily, torsemide  100 mg TTSS (on nondialysis days), amlodipine  5 mg daily   PAF (paroxysmal atrial fibrillation) rate controlled with Toprol  50 mg daily Off anticoagulation secondary to history of GI bleeding On baby aspirin , currently on hold  H/o stroke PAD s/p b/l BKA HLD Aspirin  on hold as above Not on statin??    H/o GI bleeding Noted recent admission in October for GI bleeding after which anticoagulation was held Hemoglobin has remained stable above 8. Continue Chantix Recent Labs    11/01/23 1314 11/02/23 0529 11/11/23 0427 11/11/23 1241 01/17/24 1129 01/18/24 0454 01/19/24 0429 02/01/24 2312 02/02/24 0444 02/02/24 1502 02/03/24 0442  HGB  --    < > 8.7*   < > 7.0*   < > 8.2* 8.6* 8.4* 8.1* 8.7*  MCV  --    < > 80.3   < > 87.3   < > 88.0 87.4 84.9 84.8 83.5  VITAMINB12 1,172*  --  997*  --   --   --   --   --   --   --   --   FOLATE 32.4  --  17.3  --   --   --   --   --   --   --   --   FERRITIN  --   --   --   --  1,205*  --   --   --   --   --   --  TIBC  --   --  NOT CALCULATED  --  175*  --   --   --   --   --   --   IRON  --   --  27*  --  30  --   --   --   --   --   --    < > = values in this interval not displayed.   Type 2 diabetes mellitus Hypoglycemia A1c 6.7 in August 2025 PTA meds-Toujeo  30 units daily, Premeal sliding scale insulin  Blood sugar level low in 60s this morning.  Remains n.p.o. preprocedure. Start D10 drip and continue to monitor. Currently only on SSI/Accu-Cheks.  Recent Labs  Lab 02/03/24 0000 02/03/24 0323 02/03/24 0755 02/03/24 1155 02/03/24 1235  GLUCAP 104* 89 78 66* 66*   OSA (obstructive sleep apnea) CPAP  Anxiety/depression BuSpar  7.5 mg twice daily, Zoloft  75 mg daily   Mobility: Has prosthetics for both legs but not yet learned to use them.  PT  Orders:   PT Follow up Rec:     Goals of care   Code Status: Full Code     DVT prophylaxis:  heparin  injection 5,000 Units Start: 02/02/24 1400   Antimicrobials: IV Rocephin , vancomycin , Flagyl  Fluid: D10 drip Consultants: Orthopedics, nephrology, vascular Family Communication: Daughter at bedside  Status: Inpatient Level of care:  Telemetry   Patient is from: Home Needs to continue in-hospital care: Ongoing workup and management Anticipated d/c to: Pending clinical course   Diet:  Diet Order             Diet NPO time specified  Diet effective midnight                   Scheduled Meds:  amLODipine   5 mg Oral Daily   busPIRone   7.5 mg Oral BID   calcitRIOL   1 mcg Oral Q M,W,F-HD   chlorhexidine   60 mL Topical Once   Chlorhexidine  Gluconate Cloth  6 each Topical Q0600   cinacalcet   90 mg Oral Q M,W,F   heparin   5,000 Units Subcutaneous Q8H   insulin  aspart  0-6 Units Subcutaneous Q4H   metoprolol  succinate  50 mg Oral Daily   mupirocin  ointment  1 Application Nasal BID   pantoprazole   40 mg Oral Daily   povidone-iodine   2 Application Topical Once   sertraline   75 mg Oral Daily   sodium chloride  flush  3 mL Intravenous Q12H   torsemide   100 mg Oral Q T,Th,S,Su    PRN meds: acetaminophen  **OR** acetaminophen , fentaNYL  (SUBLIMAZE ) injection, oxyCODONE , prochlorperazine , senna   Infusions:    ceFAZolin  (ANCEF ) IV     cefTRIAXone  (ROCEPHIN )  IV Stopped (02/02/24 2245)   dextrose  50 mL/hr at 02/03/24 1251   metronidazole  Stopped (02/03/24 0020)   vancomycin       Antimicrobials: Anti-infectives (From admission, onward)    Start     Dose/Rate Route Frequency Ordered Stop   02/03/24 0600  ceFAZolin  (ANCEF ) IVPB 2g/100 mL premix        2 g 200 mL/hr over 30 Minutes Intravenous On call to O.R. 02/02/24 2344 02/04/24 0559   02/02/24 2200  cefTRIAXone  (ROCEPHIN ) 2 g in sodium chloride  0.9 % 100 mL IVPB        2 g 200 mL/hr over 30 Minutes Intravenous  Every 24 hours 02/02/24 0241     02/02/24 1200  vancomycin  (VANCOREADY) IVPB 750 mg/150 mL        750 mg  150 mL/hr over 60 Minutes Intravenous Every M-W-F (Hemodialysis) 02/02/24 0251     02/02/24 1000  metroNIDAZOLE  (FLAGYL ) IVPB 500 mg        500 mg 100 mL/hr over 60 Minutes Intravenous Every 12 hours 02/02/24 0241     02/02/24 0000  cefTRIAXone  (ROCEPHIN ) 2 g in sodium chloride  0.9 % 100 mL IVPB  Status:  Discontinued       Placed in And Linked Group   2 g 200 mL/hr over 30 Minutes Intravenous Every 24 hours 02/01/24 2352 02/02/24 0244   02/02/24 0000  metroNIDAZOLE  (FLAGYL ) IVPB 500 mg  Status:  Discontinued       Placed in And Linked Group   500 mg 100 mL/hr over 60 Minutes Intravenous Every 8 hours 02/01/24 2352 02/02/24 0244   02/02/24 0000  vancomycin  (VANCOREADY) IVPB 1250 mg/250 mL        1,250 mg 166.7 mL/hr over 90 Minutes Intravenous  Once 02/01/24 2355 02/02/24 0435       Objective: Vitals:   02/03/24 0725 02/03/24 1117  BP: (!) 151/67 136/66  Pulse: 64 (!) 58  Resp: 16 16  Temp: 97.7 F (36.5 C) 97.8 F (36.6 C)  SpO2: 100% 97%    Intake/Output Summary (Last 24 hours) at 02/03/2024 1252 Last data filed at 02/03/2024 0900 Gross per 24 hour  Intake 300 ml  Output 2000 ml  Net -1700 ml   Filed Weights   02/02/24 1500 02/02/24 1619 02/02/24 1940  Weight: 74.2 kg 71 kg 69 kg   Weight change:  Body mass index is 24.55 kg/m.   Physical Exam: General exam: Pleasant, elderly African-American female Skin: No rashes, lesions or ulcers. HEENT: Atraumatic, normocephalic, no obvious bleeding Lungs: Clear to auscultation bilaterally,  CVS: S1, S2, no murmur,   GI/Abd: Soft, nontender, nondistended, bowel sound present,   CNS: Alert, awake, oriented x 3 Psychiatry: Mood appropriate Extremities: Bilateral prior BKA status.  Data Review: I have personally reviewed the laboratory data and studies available.  F/u labs ordered Unresulted Labs (From  admission, onward)     Start     Ordered   02/02/24 0500  Basic metabolic panel  Daily,   R      02/02/24 0241   02/02/24 0500  CBC  Daily,   R      02/02/24 0241   02/01/24 2351  Aerobic Culture w Gram Stain (superficial specimen)  Once,   URGENT        02/01/24 2350   02/01/24 2206  Urinalysis, w/ Reflex to Culture (Infection Suspected) -Urine, Clean Catch  (Undifferentiated presentation (screening labs and basic nursing orders))  ONCE - URGENT,   URGENT       Question:  Specimen Source  Answer:  Urine, Clean Catch   02/01/24 2206            Signed, Chapman Rota, MD [facility] 02/03/2024

## 2024-02-03 NOTE — Consult Note (Addendum)
 Hospital Consult    Reason for Consult: Left hand gangrene Requesting Physician: Nephrology MRN #:  [id number]  History of Present Illness: This is a 68 y.o. female with ESRD on HD being seen in consultation for evaluation of left hand gangrene.  She has a left arm AV fistula which is her only dialysis access.  She reports fistula is functioning appropriately.  She has had numerous operations on the left hand over the past several months however has not been able to heal her incisions.  She is posted for a partial handed potation today with orthopedics for source control.  Surgical history also significant for bilateral BKA's.  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  Last Assessment & Plan:   Multiple polyps with surveillance due in 2021.  IMO SNOMED Dx Update Oct 2024     History of pneumonia 2019   Hypoxia 04/03/2019   Memory loss    Mixed hyperlipidemia    Not currently working due to disabled status 06/13/2018   Formatting of this note might be different from the original.  Last Assessment & Plan:   Formatting of this note might be different from the original.  Reports being   disabled since April/May 2019 , hospitalized then in a niursing home, now on dialysis and is unable to work     Obesity    Pancreatitis    Peritoneal dialysis catheter in place    Pneumonia due to COVID-19 virus 04/02/2019   Sleep apnea    Noncompliant with CPAP   Stroke (HCC)    mini stroke   Type 2 diabetes mellitus (HCC)     Past Surgical History:  Procedure Laterality Date   ABDOMINAL AORTOGRAM W/LOWER EXTREMITY N/A 04/30/2022   Procedure: ABDOMINAL AORTOGRAM W/LOWER EXTREMITY;  Surgeon: Magda Debby SAILOR, MD;  Location: [facility];  Service: Cardiovascular;  Laterality: N/A;   ABDOMINAL AORTOGRAM W/LOWER EXTREMITY N/A 07/21/2022   Procedure: ABDOMINAL AORTOGRAM W/LOWER EXTREMITY;  Surgeon: Serene Gaile ORN, MD;  Location: [facility];  Service: Cardiovascular;  Laterality: N/A;   ABDOMINAL HYSTERECTOMY     ACHILLES TENDON LENGTHENING  08/15/2022   Procedure: ACHILLES TENDON LENGTHENING;  Surgeon: Silva Juliene SAUNDERS, DPM;  Location: [facility] OR;  Service: Podiatry;;   AMPUTATION Right 05/29/2022   Procedure: RIGHT BELOW THE KNEE AMPUTATION;  Surgeon: Harden Jerona GAILS, MD;  Location: [facility] OR;  Service: Orthopedics;  Laterality: Right;   AMPUTATION Left 09/04/2022   Procedure: AMPUTATION FOOT, serial irrigation;  Surgeon: Joya Stabs, DPM;  Location: [facility] OR;  Service: Podiatry;  Laterality: Left;  Surgical team to do block   AMPUTATION Left 10/07/2022   Procedure: LEFT BELOW KNEE AMPUTATION;  Surgeon: Harden Jerona GAILS, MD;  Location: [facility] OR;  Service: Orthopedics;  Laterality: Left;   AMPUTATION FINGER Left 09/29/2023   Procedure: AMPUTATION, FINGER;  Surgeon: Harden Jerona GAILS, MD;  Location[facility] OR;  Service: Orthopedics;  Laterality: Left;  LEFT HAND LONG FINGER RAY AMPUTATION   AV FISTULA PLACEMENT Left 09/02/2017   Procedure: creation of left arm ARTERIOVENOUS (AV) FISTULA;  Surgeon: Serene Gaile ORN, MD;  Location: [facility] OR;  Service: Vascular;  Laterality: Left;   COLONOSCOPY  2008   Dr.  Harvey: normal    COLONOSCOPY N/A 12/18/2016   Dr. Harvey: multiple tubular adenomas, internal hemorrhoids. Surveillance in 3 years    COLONOSCOPY N/A 12/05/2023   Procedure: COLONOSCOPY;  Surgeon: Cindie Carlin POUR, DO;  [facility]: [facility];  Service: Endoscopy;  Laterality: N/A;   COLONOSCOPY N/A 12/17/2023   Procedure: COLONOSCOPY;  Surgeon: Shaaron Lamar HERO, MD;  [facility]: [facility];  Service: Endoscopy;  Laterality: N/A;   COLONOSCOPY N/A 01/01/2024   Procedure: COLONOSCOPY;  Surgeon: Cindie Carlin POUR, DO;  [facility];  Service: Endoscopy;  Laterality: N/A;   ESOPHAGEAL DILATION N/A 10/13/2015   Procedure: ESOPHAGEAL DILATION;  Surgeon: Claudis RAYMOND Rivet, MD;  [facility]: [facility];  Service: Endoscopy;  Laterality: N/A;   ESOPHAGEAL DILATION N/A 12/03/2023   Procedure: DILATION, ESOPHAGUS;  Surgeon: Cindie Carlin POUR, DO;  [facility]: [facility];  Service: Endoscopy;  Laterality: N/A;   ESOPHAGOGASTRODUODENOSCOPY N/A 10/13/2015   Dr. Rivet: chronic gastritis on path, no H.pylori. Empiric dilation    ESOPHAGOGASTRODUODENOSCOPY N/A 12/18/2016   Dr. Harvey: mild gastritis. BRAVO study revealed uncontrolled GERD. Dysphagia secondary to uncontrolled reflux   ESOPHAGOGASTRODUODENOSCOPY N/A 12/03/2023   Procedure: EGD (ESOPHAGOGASTRODUODENOSCOPY);  Surgeon: Cindie Carlin POUR, DO;  [facility];  Service: Endoscopy;  Laterality: N/A;   FOOT SURGERY Bilateral    nerve     INCISION AND DRAINAGE OF WOUND Left 10/30/2023   Procedure: IRRIGATION AND DEBRIDEMENT WOUND;  Surgeon: Arlinda Buster, MD;  Location: [facility] OR;  Service: Orthopedics;  Laterality: Left;  LEFT HAND WOUND   LEFT HEART CATH AND CORONARY ANGIOGRAPHY N/A 12/29/2018   Procedure: LEFT HEART CATH AND CORONARY ANGIOGRAPHY;  Surgeon: Dann Candyce RAMAN, MD;  Location: [facility] INVASIVE CV LAB;  Service: Cardiovascular;  Laterality: N/A;   LOWER EXTREMITY ANGIOGRAPHY Right 05/04/2022   Procedure: Lower Extremity  Angiography;  Surgeon: Lanis Fonda BRAVO, MD;  Location: [facility] INVASIVE CV LAB;  Service: Cardiovascular;  Laterality: Right;   LUNG BIOPSY     MASS EXCISION Right 01/09/2013   Procedure: EXCISION OF NEOPLASM OF RIGHT  AXILLA  AND EXCISION OF NEOPLASM OF LEFT AXILLA;  Surgeon: Oneil DELENA Budge, MD;  Location: [facility];  Service: General;  Laterality: Right;  procedure end @ 08:23   MYRINGOTOMY WITH TUBE PLACEMENT Bilateral 04/28/2017   Procedure: BILATERAL MYRINGOTOMY WITH TUBE PLACEMENT;  Surgeon: Karis Clunes, MD;  Location: [facility] OR;  Service: ENT;  Laterality: Bilateral;   PERIPHERAL VASCULAR BALLOON ANGIOPLASTY Right 05/04/2022   Procedure: PERIPHERAL VASCULAR BALLOON ANGIOPLASTY;  Surgeon: Lanis Fonda BRAVO, MD;  Location[facility] INVASIVE CV LAB;  Service: Cardiovascular;  Laterality: Right;  PT   PERIPHERAL VASCULAR INTERVENTION Right 05/04/2022   Procedure: PERIPHERAL VASCULAR INTERVENTION;  Surgeon: Lanis Fonda BRAVO, MD;  Location: [facility] INVASIVE CV LAB;  Service:  Cardiovascular;  Laterality: Right;  SFA   PERIPHERAL VASCULAR INTERVENTION Left 07/21/2022   Procedure: PERIPHERAL VASCULAR INTERVENTION;  Surgeon: Serene Gaile ORN, MD;  [facility]: [facility];  Service: Cardiovascular;  Laterality: Left;   REVISION OF ARTERIOVENOUS GORETEX GRAFT Left 05/04/2018   Procedure: TRANSPOSITION OF CEPHALIC VEIN ARTERIOVENOUS FISTULA LEFT ARM;  Surgeon: Oris Krystal FALCON, MD;  Location: [facility] OR;  Service: Vascular;  Laterality: Left;   SAVORY DILATION N/A 12/18/2016   Procedure: SAVORY DILATION;  Surgeon: Harvey Margo CROME, MD;  [facility];  Service: Endoscopy;  Laterality: N/A;   TRANSMETATARSAL AMPUTATION Left 08/15/2022   Procedure: TRANSMETATARSAL AMPUTATION;  Surgeon: Silva Juliene SAUNDERS, DPM;  Location: [facility] OR;  Service: Podiatry;  Laterality: Left;    Allergies  Allergen Reactions   Ace Inhibitors Anaphylaxis and Swelling   Penicillins Itching and Swelling    Has tolerated cefazolin  on multiple occasions     Statins Other (See Comments)    Elevated LFT's   Albuterol  Swelling    Prior to Admission medications   Medication Sig Start Date End Date Taking? Authorizing Provider  acetaminophen  (TYLENOL ) 500 MG tablet Take 1,000 mg by mouth every 6 (six) hours as needed for mild pain (pain score 1-3).   Yes [provider]  amLODipine  (NORVASC ) 10 MG tablet Take 0.5 tablets (5 mg total) by mouth daily. 10/28/23  Yes Emokpae, Courage, MD  ASPIRIN  LOW DOSE 81 MG chewable tablet Chew 162 mg by mouth daily. 01/05/24  Yes [provider]  B Complex-C-Folic Acid  (RENA-VITE RX) 1 MG TABS Take 1 tablet by mouth daily. 04/15/22  Yes [provider]  busPIRone  (BUSPAR ) 7.5 MG tablet Take 1 tablet (7.5 mg total) by mouth 2 (two) times daily. 11/24/23  Yes Antonetta Rollene BRAVO, MD  calcitRIOL  (ROCALTROL ) 0.5 MCG capsule Take 2 capsules (1 mcg total) by mouth every Monday, Wednesday, and Friday with hemodialysis. 11/05/23  Yes Amin, Ankit C, MD  cinacalcet  (SENSIPAR ) 30 MG tablet Take 3 tablets (90 mg total) by mouth every Monday, Wednesday, and Friday. 12/08/23  Yes Johnson, Clanford L, MD  ezetimibe  (ZETIA ) 10 MG tablet Take 1 tablet (10 mg total) by mouth daily. 04/27/23  Yes Antonetta Rollene BRAVO, MD  insulin  aspart (NOVOLOG ) 100 UNIT/ML injection Inject 0-6 Units into the skin 3 (three) times daily with meals. CBG 70 - 120: 0 units  CBG 121 - 150: 0 units  CBG 151 - 200: 1 unit  CBG 201-250: 2 units  CBG 251-300: 3 units  CBG 301-350: 4 units  CBG 351-400: 5 units  CBG > 400: Give 10 units and call MD 11/12/23  Yes Shahmehdi, Seyed A, MD  insulin  glargine, 2 Unit Dial , (TOUJEO  MAX SOLOSTAR) 300 UNIT/ML Solostar Pen Inject 30 Units into the skin daily. May need to slowly increase the dose depending upon your blood sugar, follow-up with PCP 01/01/23  Yes Trixie File, MD  metoprolol  succinate (TOPROL -XL) 50 MG 24 hr tablet TAKE ONE TABLET BY MOUTH ONCE DAILY WITH FOOD (TAKE AFTER DIALYSIS)  12/20/23  Yes Antonetta Rollene BRAVO, MD  ondansetron  (ZOFRAN ) 4 MG tablet Take 4 mg by mouth every 8 (eight) hours as needed for nausea or vomiting. 11/26/23  Yes [provider]  pantoprazole  (PROTONIX ) 40 MG tablet Take 1 tablet (40 mg total) by mouth daily. 12/08/23  Yes Johnson, Clanford L, MD  sertraline  (ZOLOFT ) 50 MG tablet Take 1.5 tablets (75 mg total) by mouth daily. Take one and a half tablets  once daily 01/02/24  Yes Johnson, Clanford L, MD  torsemide  (DEMADEX ) 100 MG tablet Take 1 tablet (100 mg total) by mouth every Tuesday, Thursday, Saturday, and Sunday. Non-dialysis days 01/02/24  Yes Johnson, Clanford L, MD  [name]  500 MG chewable tablet Chew 1-2 tablets (500-1,000 mg total) by mouth See admin instructions. Take 1000mg  (2 tablets) by mouth with meals and 500mg  (1 tablet) with snacks 08/23/23  Yes Antonetta Rollene BRAVO, MD  nitroGLYCERIN  (NITRODUR - DOSED IN MG/24 HR) 0.2 mg/hr patch Place 1 patch (0.2 mg total) onto the skin daily. Patient not taking: Reported on 02/02/2024 12/28/23   Arlinda Buster, MD  FLUoxetine (PROZAC) 10 MG capsule Take 10 mg by mouth daily.    05/28/11  [provider]  glipiZIDE  (GLUCOTROL ) 10 MG tablet Take 10 mg by mouth 2 (two) times daily before a meal.    05/28/11  [provider]    Social History   Socioeconomic History   Marital status: Married    Spouse name: Natiya Seelinger   Number of children: 2   Years of education: 12   Highest education level: Some college, no degree  Occupational History   Occupation: retired   Tobacco Use   Smoking status: Never    Passive exposure: Never   Smokeless tobacco: Never   Tobacco comments:    Verified by Daughter, [name]  Vaping Use   Vaping status: Never Used  Substance and Sexual Activity   Alcohol use: No   Drug use: No   Sexual activity: Not Currently    Partners: Male  Other Topics Concern   Not on file  Social History Narrative   Lives alone with husband     Right handed   Caffeine-1/2 daily   Social Drivers of Health   Financial Resource Strain: Low Risk  (11/24/2023)   Overall Financial Resource Strain (CARDIA)    Difficulty of Paying Living Expenses: Not hard at all  Food Insecurity: No Food Insecurity (01/17/2024)   Hunger Vital Sign    Worried About Running Out of Food in the Last Year: Never true    Ran Out of Food in the Last Year: Never true  Transportation Needs: No Transportation Needs (01/17/2024)   PRAPARE - Administrator, Civil Service (Medical): No    Lack of Transportation (Non-Medical): No  Physical Activity: Inactive (11/24/2023)   Exercise Vital Sign    Days of Exercise per Week: 0 days    Minutes of Exercise per Session: Not on file  Stress: Stress Concern Present (11/24/2023)   [employer] of Occupational Health - Occupational Stress Questionnaire    Feeling of Stress: To some extent  Social Connections: Socially Integrated (01/17/2024)   Social Connection and Isolation Panel    Frequency of Communication with Friends and Family: More than three times a week    Frequency of Social Gatherings with Friends and Family: More than three times a week    Attends Religious Services: More than 4 times per year    Active Member of [employer] or Organizations: Yes    Attends Banker Meetings: 1 to 4 times per year    Marital Status: Married  Recent Concern: Social Connections - Moderately Isolated (11/25/2023)   Social Connection and Isolation Panel    Frequency of Communication with Friends and Family: Three times a week    Frequency of Social Gatherings with Friends and Family: Three times a week    Attends Religious Services: Patient declined  Active Member of Clubs or Organizations: No    Attends Banker Meetings: Never    Marital Status: Married  Catering Manager Violence: Not At Risk (01/17/2024)   Humiliation, Afraid, Rape, and Kick questionnaire    Fear of Current or Ex-Partner:  No    Emotionally Abused: No    Physically Abused: No    Sexually Abused: No    Family History  Problem Relation Age of Onset   Hypertension Father    Hypercholesterolemia Father    Arthritis Father    Hypertension Sister    Hypercholesterolemia Sister    Breast cancer Sister    Hypertension Sister    Colon cancer Neg Hx    Colon polyps Neg Hx     ROS: Otherwise negative unless mentioned in HPI  Physical Examination  Vitals:   02/03/24 0725 02/03/24 1117  BP: (!) 151/67 136/66  Pulse: 64 (!) 58  Resp: 16 16  Temp: 97.7 F (36.5 C) 97.8 F (36.6 C)  SpO2: 100% 97%   Body mass index is 24.55 kg/m.  General:  WDWN in NAD Gait: Not observed HENT: WNL, normocephalic Pulmonary: normal non-labored breathing Cardiac: regular Abdomen:  soft, NT/ND, no masses Skin: without rashes Extremities: Dressing left in place left hand Musculoskeletal: no muscle wasting or atrophy  Neurologic: A&O X 3;  No focal weakness or paresthesias are detected; speech is fluent/normal Psychiatric:  The pt has Normal affect. Lymph:  Unremarkable       CBC    Component Value Date/Time   WBC 11.0 (H) 02/03/2024 0442   RBC 3.40 (L) 02/03/2024 0442   HGB 8.7 (L) 02/03/2024 0442   HGB 10.9 (L) 12/15/2022 1340   HCT 28.4 (L) 02/03/2024 0442   HCT 37.0 12/15/2022 1340   PLT 457 (H) 02/03/2024 0442   PLT 354 12/15/2022 1340   MCV 83.5 02/03/2024 0442   MCV 81 12/15/2022 1340   MCH 25.6 (L) 02/03/2024 0442   MCHC 30.6 02/03/2024 0442   RDW 18.0 (H) 02/03/2024 0442   RDW 18.4 (H) 12/15/2022 1340   LYMPHSABS 1.2 02/01/2024 2312   LYMPHSABS 1.0 12/15/2022 1340   MONOABS 0.6 02/01/2024 2312   EOSABS 0.0 02/01/2024 2312   EOSABS 0.0 12/15/2022 1340   BASOSABS 0.0 02/01/2024 2312   BASOSABS 0.0 12/15/2022 1340    BMET    Component Value Date/Time   NA 136 02/03/2024 0442   NA 142 07/07/2022 1140   K 4.0 02/03/2024 0442   CL 96 (L) 02/03/2024 0442   CO2 27 02/03/2024 0442    GLUCOSE 82 02/03/2024 0442   BUN 25 (H) 02/03/2024 0442   BUN 19 07/07/2022 1140   CREATININE 2.98 (H) 02/03/2024 0442   CREATININE 3.74 (H) 08/23/2017 1407   CALCIUM  8.6 (L) 02/03/2024 0442   CALCIUM  9.4 10/15/2017 1258   GFRNONAA 17 (L) 02/03/2024 0442   GFRNONAA 12 (L) 08/23/2017 1407   GFRAA 8 (L) 07/19/2019 1210   GFRAA 14 (L) 08/23/2017 1407    COAGS: Lab Results  Component Value Date   INR 1.1 02/01/2024   INR 1.0 01/17/2024   INR 1.2 12/15/2023      ASSESSMENT/PLAN: This is a 68 y.o. female with gangrene left hand  Ms. Rosten is a 68 year old female with a necrotic/gangrenous left hand.  She has a left arm AV fistula as well.  She will require ligation of left arm AV fistula to improve her chances of healing a left hand amputation.  Given that  this is her only dialysis access she will also require TDC or trialysis catheter placement.  Case was discussed in detail with the patient and her daughter and they are agreeable to proceed.  On-call vascular surgeon Dr. Magda will evaluate the patient later today and provide further treatment plans.  Plans noted for partial hand amputation today in the OR with orthopedics.   Donnice Sender PA-C [facility] [phone]  VASCULAR STAFF ADDENDUM: I have independently interviewed and examined the patient. I agree with the above.  Left arm access related hand ischemia with significant gangrenous change to the left hand.  Plan partial hand amputation in the operating room with Dr. Agarwala today.  I will plan to join him for fistula ligation and placement of tunneled dialysis catheter to maintain dialysis access.  Discussed in detail with the family and the patient who are willing to proceed.  Debby SAILOR. Magda, MD [facility] Phone Number: [phone] 02/03/2024 3:53 PM

## 2024-02-03 NOTE — Anesthesia Preprocedure Evaluation (Addendum)
 Anesthesia Evaluation  Patient identified by MRN, date of birth, ID band Patient awake    Reviewed: Allergy & Precautions, NPO status , Patient's Chart, lab work & pertinent test results  Airway Mallampati: III  TM Distance: >3 FB Neck ROM: Full    Dental no notable dental hx.    Pulmonary sleep apnea    Pulmonary exam normal        Cardiovascular hypertension, Pt. on home beta blockers and Pt. on medications + Peripheral Vascular Disease and +CHF  Normal cardiovascular exam  ECHO: 1. Left ventricular ejection fraction, by estimation, is 55%. The left  ventricle has normal function. The left ventricle has no regional wall  motion abnormalities. There is mild left ventricular hypertrophy. Left  ventricular diastolic parameters are  consistent with Grade I diastolic dysfunction (impaired relaxation).  Elevated left ventricular end-diastolic pressure.   2. Right ventricular systolic function is normal. The right ventricular  size is normal. Tricuspid regurgitation signal is inadequate for assessing  PA pressure.   3. Left atrial size was severely dilated.   4. The mitral valve is abnormal. Trivial mitral valve regurgitation. Mild  mitral stenosis. Severe mitral annular calcification.   5. The aortic valve has an indeterminant number of cusps. Aortic valve  regurgitation is not visualized. Moderate aortic valve stenosis. Aortic  valve area, by VTI measures 1.14 cm. Aortic valve mean gradient measures  21.0 mmHg. Aortic valve Vmax  measures 3.04 m/s.   6. The inferior vena cava is normal in size with greater than 50%  respiratory variability, suggesting right atrial pressure of 3 mmHg.     Neuro/Psych  Headaches PSYCHIATRIC DISORDERS Anxiety Depression    CVA    GI/Hepatic Neg liver ROS,GERD  Medicated and Controlled,,  Endo/Other  diabetes, Insulin  Dependent    Renal/GU ESRF and DialysisRenal diseaseOn HD M, W, F      Musculoskeletal  (+) Arthritis ,    Abdominal   Peds  Hematology  (+) Blood dyscrasia, anemia   Anesthesia Other Findings Left hand Gangrene   Reproductive/Obstetrics                              Anesthesia Physical Anesthesia Plan  ASA: 3  Anesthesia Plan: General   Post-op Pain Management:    Induction: Intravenous  PONV Risk Score and Plan: 3 and Ondansetron , Dexamethasone  and Treatment may vary due to age or medical condition  Airway Management Planned: Oral ETT  Additional Equipment:   Intra-op Plan:   Post-operative Plan: Extubation in OR  Informed Consent: I have reviewed the patients History and Physical, chart, labs and discussed the procedure including the risks, benefits and alternatives for the proposed anesthesia with the patient or authorized representative who has indicated his/her understanding and acceptance.     Dental advisory given  Plan Discussed with: CRNA  Anesthesia Plan Comments:          Anesthesia Quick Evaluation

## 2024-02-03 NOTE — Op Note (Signed)
 DATE OF SERVICE: 02/03/2024  PATIENT:  Kathryn Beck  68 y.o. female  PRE-OPERATIVE DIAGNOSIS:  ESRD, access related left hand ischemia  POST-OPERATIVE DIAGNOSIS:  Same  PROCEDURE:   1) ligation of left arm arteriovenous fistula 2) placement of right internal jugular tunneled dialysis catheter  SURGEON:      * Magda Debby SAILOR, MD - Primary  ASSISTANT: none  An experienced assistant was required given the complexity of this procedure and the standard of surgical care. My assistant helped with exposure through counter tension, suctioning, ligation and retraction to better visualize the surgical field.  My assistant expedited sewing during the case by following my sutures. Wherever I use the term we in the report, my assistant actively helped me with that portion of the procedure.  ANESTHESIA:   general  EBL: 50mL  BLOOD ADMINISTERED:none  DRAINS: none   LOCAL MEDICATIONS USED:  NONE  SPECIMEN:  none  COUNTS: confirmed correct.  TOURNIQUET:  none  PATIENT DISPOSITION:  PACU - hemodynamically stable.   Delay start of Pharmacological VTE agent (>24hrs) due to surgical blood loss or risk of bleeding: no  INDICATION FOR PROCEDURE: Kathryn Beck is a 68 y.o. female with ESRD on HD via LUE AVF. She has a gangrenous hand and needs a hand amputation. She has a left brachiocephalic AVF. After careful discussion of risks, benefits, and alternatives the patient was offered fistula ligation and TDC placement. The patient understood and wished to proceed.  OPERATIVE FINDINGS: unremarkable ligation of fistula and TDC placement. TDC functioning well at completion.  DESCRIPTION OF PROCEDURE: After identification of the patient in the pre-operative holding area, the patient was transferred to the operating room. The patient was positioned supine on the operating room table. Anesthesia was induced. The left arm and right neck and chest were prepped and draped in standard fashion. A  surgical pause was performed confirming correct patient, procedure, and operative location.  Transverse incision was made over the course of the fistula near its anastomosis.  The fistula was isolated in the incision and encircled with silk suture.  The fistula was ligated and then divided.  I reinforced the repair with 4-0 Prolene suture after it appeared that one of the silks was starting to slip.  Hemostasis was then confirmed and the wound closed with 3-0 Vicryl and 4-0 Monocryl.  Using ultrasound guidance the right internal jugular vein was accessed with micropuncture technique.  Through the micropuncture sheath a floppy J-wire was advanced into the superior vena cava.  A small incision was made around the skin access point.  The access point was serially dilated under direct fluoroscopic guidance.  A peel-away sheath was introduced into the superior vena cava under fluoroscopic guidance.  A counterincision was made in the chest under the clavicle.  A 19 cm tunnel dialysis catheter was then tunneled under the skin, over the clavicle into the incision in the neck.  The tunneling device was removed and the catheter fed through the peel-away sheath into the superior vena cava.  The peel-away sheath was removed and the catheter gently pulled back.  Adequate position was confirmed with x-ray.  The catheter was tested and found to flush and draw back well.  Catheter was heparin  locked.  Caps were applied.  Catheter was sutured to the skin.  The neck incision was closed with 4-0 Monocryl.  Upon completion of the case instrument and sharps counts were confirmed correct. The patient was transferred to the PACU in good condition. I  was present for all portions of the procedure.  FOLLOW UP PLAN: OK to use catheter. Keep ACE bandage on arm to reduce risk of phlebitis in ligated fistula.   Debby SAILOR. Magda, MD [facility] Phone Number: [phone] 02/03/2024 6:56  PM

## 2024-02-03 NOTE — Plan of Care (Signed)
  Problem: Clinical Measurements: Goal: Will remain free from infection Outcome: Progressing Goal: Respiratory complications will improve Outcome: Progressing   Problem: Activity: Goal: Risk for activity intolerance will decrease Outcome: Progressing   Problem: Safety: Goal: Ability to remain free from injury will improve Outcome: Progressing   

## 2024-02-03 NOTE — Progress Notes (Signed)
 Hypoglycemic Event  CBG: 66   Treatment: D10 @ 50    Symptoms: None   Follow-up CBG: Time:1235  CBG Result: 72  Possible Reasons for Event: Other:   NPO for surgery  Comments/MD notified: Dr. Arlice notified and placed order for D10 @ 25. At 15 minute check, CBG was the same. MD placed new order for D10 @ 50. CBG then increased to 72.     Kathryn Beck Kathryn Beck

## 2024-02-03 NOTE — Progress Notes (Signed)
  Hypoglycemic Event  CBG: 57  Treatment: D50 25 mL (12.5 gm)  Symptoms: None  Follow-up CBG: Time: 2038 CBG Result: 99  Possible Reasons for Event: Unknown   Kathryn Beck T Kathryn Beck

## 2024-02-03 NOTE — Anesthesia Procedure Notes (Signed)
 Procedure Name: Intubation Date/Time: 02/03/2024 4:49 PM  Performed by: Mannie Krystal LABOR, CRNAPre-anesthesia Checklist: Patient identified, Emergency Drugs available, Suction available and Patient being monitored Patient Re-evaluated:Patient Re-evaluated prior to induction Oxygen  Delivery Method: Circle system utilized Preoxygenation: Pre-oxygenation with 100% oxygen  Induction Type: Combination inhalational/ intravenous induction Ventilation: Mask ventilation without difficulty and Oral airway inserted - appropriate to patient size Laryngoscope Size: Glidescope and 3 Grade View: Grade I Tube type: Oral Tube size: 7.0 mm Number of attempts: 1 Airway Equipment and Method: Oral airway, Video-laryngoscopy and Rigid stylet Placement Confirmation: ETT inserted through vocal cords under direct vision, positive ETCO2 and breath sounds checked- equal and bilateral Secured at: 21 cm Tube secured with: Tape Dental Injury: Teeth and Oropharynx as per pre-operative assessment  Comments: Unable to obtain view better than Grade 3 with Mac 4 and Mil 2; patient easy intubation with glidescope.

## 2024-02-03 NOTE — Pre-Procedure Instructions (Signed)
 Dr Madelin office messaged about patients inpatient status and another surgery being done on her.

## 2024-02-03 NOTE — Op Note (Signed)
 NAME: Kathryn Beck MEDICAL RECORD NO: [id number] DATE OF BIRTH: Sep 11, 1955 FACILITY: [facility] LOCATION: [facility] OR PHYSICIAN: GILDARDO ALDERTON, MD   OPERATIVE REPORT   DATE OF PROCEDURE: 02/03/24    PREOPERATIVE DIAGNOSIS: Left hand gangrenous infection   POSTOPERATIVE DIAGNOSIS: Left hand gangrenous infection   PROCEDURE: Left hand irrigation debridement Left forearm amputation   SURGEON:  Gildardo Alderton, M.D.   ASSISTANT: Kathryn Rummer, PA   ANESTHESIA:  General   INTRAVENOUS FLUIDS:  Per anesthesia flow sheet.   ESTIMATED BLOOD LOSS:  Minimal.   COMPLICATIONS:  None.   SPECIMENS: Amputated hand was sent as a permanent specimen   TOURNIQUET TIME: Not utilized  DISPOSITION:  Stable to PACU.   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  Initially had significant infection of left hand and underwent aggressive I&D with subsequent close outpatient follow-up.  Patient had been receiving ongoing IV antibiotics with dialysis as well.  Yesterday, patient was admitted to the medical service for dry gangrene that started to become more symptomatic and concerning for wet gangrenous infection.  Patient was admitted to the medical service and was indicated for left hand partial versus complete amputation with mid forearm amputation as a possibility for source control.  Risks and benefits of surgery were discussed with the patient and family including the risks of infection, bleeding, scarring, stiffness, nerve injury, vascular injury, tendon injury, need for subsequent operation, need for repeat I&D, recurrence.  She voiced understanding of these risks and elected to proceed.  Consent was obtained through the family as well given the patient's underlying  confusion.  OPERATIVE COURSE: Patient was seen and identified in the preoperative area and marked appropriately.  Surgical consent had been signed. She was transferred to the operating room and placed in supine position with the left upper extremity on an arm board.  General anesthesia was induced by the anesthesiologist.  Left upper extremity was prepped and draped in normal sterile orthopedic fashion.  Upon closer inspection of the left hand with the patient under anesthesia, it was deemed that the tissues were nonviable and the infection was quite deep indicating the need for forearm amputation for source control.  This was discussed in detail with the family prior to starting the case, they agreed to proceed with forearm amputation.  A surgical pause was performed between the surgeons, anesthesia, and operating room staff and all were in agreement as to the patient, procedure, and site of procedure.   A fishmouth style incision was marked at the distal forearm level, just proximal to the wrist crease.  All tissue distal to this region was deemed to be nonviable with notable ongoing wet gangrenous infection.  Once the appropriate fishmouth incision was marked to ensure adequate soft tissue coverage with a longer volar flap, we proceeded.  Skin and subcutaneous tissues were incised sharply.  The dorsal and volar compartments were divided utilizing electrocautery.  Care was taken to maintain appropriate muscle length for soft tissue padding.  Median ulnar and radial nerves were identified proximally, gently traction and sharply transected, these were then subsequently buried in the surrounding soft tissue.  Smaller sensory branches were treated in similar fashion.  The radius and the ulna were exposed subperiosteally.  Utilizing an oscillating blade, the bones were cut at the distal forearm level.  We  performed staggered bone cuts to reduce risk of synostosis as well.  The sharp edges were beveled and a rasp  was utilized to smooth the surfaces.  Radial and ulnar arteries were identified as well, 3-0 silk ties were utilized for ligation.  The amputated hand was then sent as a permanent specimen for pathology.  Wound was then copiously irrigated with normal saline.  Tourniquet was not utilized for this case given the significant peripheral arterial disease and poor blood flow.  However, it was deemed that the tissue level of the amputation was a viable with appropriate color and moderate bleeding.  Layered closure was then performed.  Muscle was reapproximated over the bone ends to provide a myoplasty layer.  The volar flap was brought dorsally and the skin was closed utilizing 3-0 nylon suture in horizontal mattress fashion.  Appropriate soft tissue padding was achieved.  Sterile dressing was then applied.  The vascular team then proceeded with their aspect of the case.  Please see their operative note for further details.  Post-operative plan: The patient will recover in the post-anesthesia care unit and then be readmitted to the medical service.  The patient will be non weight bearing on the left upper extremity in a soft dressing.   I will see the patient back in the office in 1 week for postoperative followup.    A surgical assistant was utilized throughout this procedure.  Their presence was helpful in protecting critical neurovascular structures and during the critical portions of the procedure.  The assistant was also critical in minimizing operative time and patient morbidity.   Ruby Logiudice, MD Electronically signed, 02/03/24

## 2024-02-03 NOTE — Progress Notes (Signed)
 After further inspection in the operating room, there is significant gangrenous infection spreading throughout the thumb and index finger as well.  I went and spoke with the family personally regarding this before we begin procedure in order to update the consent form to possible mid forearm amputation as well.  We discussed this in order to gain better source control and prevent repeat irrigation debridement or further resection in the future.  Daughter who performs her consents was present and agreed to this procedure, this was discussed with all family.  Jericca Russett, MD Hand Surgery, [place]

## 2024-02-04 ENCOUNTER — Encounter (HOSPITAL_COMMUNITY): Payer: Self-pay | Admitting: Orthopedic Surgery

## 2024-02-04 DIAGNOSIS — I96 Gangrene, not elsewhere classified: Secondary | ICD-10-CM | POA: Diagnosis not present

## 2024-02-04 LAB — GLUCOSE, CAPILLARY
Glucose-Capillary: 127 mg/dL — ABNORMAL HIGH (ref 70–99)
Glucose-Capillary: 145 mg/dL — ABNORMAL HIGH (ref 70–99)
Glucose-Capillary: 131 mg/dL — ABNORMAL HIGH (ref 70–99)
Glucose-Capillary: 163 mg/dL — ABNORMAL HIGH (ref 70–99)
Glucose-Capillary: 155 mg/dL — ABNORMAL HIGH (ref 70–99)

## 2024-02-04 LAB — BASIC METABOLIC PANEL WITH GFR
Anion gap: 15 (ref 5–15)
BUN: 34 mg/dL — ABNORMAL HIGH (ref 8–23)
CO2: 22 mmol/L (ref 22–32)
Calcium: 8.2 mg/dL — ABNORMAL LOW (ref 8.9–10.3)
Chloride: 95 mmol/L — ABNORMAL LOW (ref 98–111)
Creatinine, Ser: 3.85 mg/dL — ABNORMAL HIGH (ref 0.44–1.00)
GFR, Estimated: 12 mL/min — ABNORMAL LOW (ref >60)
Glucose, Bld: 119 mg/dL — ABNORMAL HIGH (ref 70–99)
Potassium: 4.1 mmol/L (ref 3.5–5.1)
Sodium: 132 mmol/L — ABNORMAL LOW (ref 135–145)

## 2024-02-04 LAB — CBC
HCT: 25.9 % — ABNORMAL LOW (ref 36.0–46.0)
Hemoglobin: 7.9 g/dL — ABNORMAL LOW (ref 12.0–15.0)
MCH: 25.4 pg — ABNORMAL LOW (ref 26.0–34.0)
MCHC: 30.5 g/dL (ref 30.0–36.0)
MCV: 83.3 fL (ref 80.0–100.0)
Platelets: 410 K/uL — ABNORMAL HIGH (ref 150–400)
RBC: 3.11 MIL/uL — ABNORMAL LOW (ref 3.87–5.11)
RDW: 17.8 % — ABNORMAL HIGH (ref 11.5–15.5)
WBC: 10.5 K/uL (ref 4.0–10.5)
nRBC: 0.8 % — ABNORMAL HIGH (ref 0.0–0.2)

## 2024-02-04 MED ORDER — CALCITRIOL 0.5 MCG PO CAPS
ORAL_CAPSULE | ORAL | Status: AC
  Filled 2024-02-04: qty 2

## 2024-02-04 MED ORDER — HEPARIN SODIUM (PORCINE) 1000 UNIT/ML IJ SOLN
INTRAMUSCULAR | Status: AC
  Filled 2024-02-04: qty 4

## 2024-02-04 MED ORDER — CINACALCET HCL 30 MG PO TABS
ORAL_TABLET | ORAL | Status: AC
  Filled 2024-02-04: qty 1

## 2024-02-04 MED ORDER — HEPARIN SODIUM (PORCINE) 1000 UNIT/ML IJ SOLN
3200.0000 [IU] | Freq: Once | INTRAMUSCULAR | Status: AC
  Administered 2024-02-04: 3200 [IU]

## 2024-02-04 MED ORDER — INSULIN ASPART 100 UNIT/ML IJ SOLN: 0.0000 [IU] | Freq: Every day | INTRAMUSCULAR | Status: DC

## 2024-02-04 MED ORDER — INSULIN ASPART 100 UNIT/ML IJ SOLN
0.0000 [IU] | Freq: Three times a day (TID) | INTRAMUSCULAR | Status: DC
  Administered 2024-02-04: 2 [IU] via SUBCUTANEOUS
  Administered 2024-02-05: 1 [IU] via SUBCUTANEOUS
  Administered 2024-02-05 – 2024-02-06 (×3): 2 [IU] via SUBCUTANEOUS
  Filled 2024-02-04 (×3): qty 2
  Filled 2024-02-04: qty 1
  Filled 2024-02-04: qty 2

## 2024-02-04 MED ORDER — OXYCODONE HCL 5 MG PO TABS
ORAL_TABLET | ORAL | Status: AC
  Filled 2024-02-04: qty 1

## 2024-02-04 MED ORDER — CINACALCET HCL 30 MG PO TABS
ORAL_TABLET | ORAL | Status: AC
  Filled 2024-02-04: qty 2

## 2024-02-04 NOTE — Progress Notes (Signed)
   02/04/24 1206  Vitals  Temp 98.7 F (37.1 C)  Pulse Rate 78  Resp (!) 8  BP (!) 183/90  SpO2 100 %  O2 Device Room Air  Weight 68.7 kg  Oxygen  Therapy  Patient Activity (if Appropriate) In bed  Pulse Oximetry Type Continuous  Oximetry Probe Site Changed No  Post Treatment  Dialyzer Clearance Clear  Liters Processed 84  Fluid Removed (mL) 2000 mL  Tolerated HD Treatment Yes  Post-Hemodialysis Comments pt. tolerated tx well

## 2024-02-04 NOTE — Telephone Encounter (Signed)
 Spoke to pt's daughter Augusto (on dpr) she states she will call to schedule pt an OV when pt is discharged from hospital.

## 2024-02-04 NOTE — Progress Notes (Signed)
 Pt. Came in on bed, assisted by porter. Pt. awake and oriented. Consent signed and on file. Pt. Has a left arm stamped wrapped in bandage.  Pt. Started with no complaints  UF goal: Tx duration: 3.5 hours  Access used: Right CVC Catheter length: 1.6cc arterial; 1.6cc Venous Access issue: None  Dressing due: Next Dialysis tx  Pt. Moved bowels today. Pt. Stated that she sometimes moved bowels and cannot be controlled. I already informed the Floor nurse Bernarda about it and also the dextrose  10% was already discontinued by the AP Chapman Rota, MD.  Complained of pain. See MAR  Tx completed and tolerated. Catheter locked and dwelled with heparin . Endorsed to floor nurse. Transported to room.  Samaiya Awadallah Rubi B. Jeannemarie Sawaya, RN [facility]

## 2024-02-04 NOTE — Progress Notes (Signed)
 HAND SURGERY PROGRESS NOTE:  Patient currently off the floor for dialysis.  Per chart, dressing remains in place.  Keep clean dry and intact until orthopedic follow-up in 1 week.  Kathryn Pavon, MD Hand Surgery, [place]

## 2024-02-04 NOTE — Progress Notes (Addendum)
  Progress Note    02/04/2024 8:32 AM 1 Day Post-Op  Subjective: Says she has some pain at her incision sites, otherwise she is doing okay    Vitals:   02/04/24 0811 02/04/24 0822  BP: (!) 167/79 (!) 159/73  Pulse: 64 62  Resp: 16 15  Temp: 98.2 F (36.8 C)   SpO2: 95% 96%    Physical Exam: General: Sitting up in bed, NAD Lungs: Nonlabored Extremities: Left forearm dressed and dry.  Left upper arm incision dressed with Ace bandage and dry   CBC    Component Value Date/Time   WBC 10.5 02/04/2024 0257   RBC 3.11 (L) 02/04/2024 0257   HGB 7.9 (L) 02/04/2024 0257   HGB 10.9 (L) 12/15/2022 1340   HCT 25.9 (L) 02/04/2024 0257   HCT 37.0 12/15/2022 1340   PLT 410 (H) 02/04/2024 0257   PLT 354 12/15/2022 1340   MCV 83.3 02/04/2024 0257   MCV 81 12/15/2022 1340   MCH 25.4 (L) 02/04/2024 0257   MCHC 30.5 02/04/2024 0257   RDW 17.8 (H) 02/04/2024 0257   RDW 18.4 (H) 12/15/2022 1340   LYMPHSABS 1.2 02/01/2024 2312   LYMPHSABS 1.0 12/15/2022 1340   MONOABS 0.6 02/01/2024 2312   EOSABS 0.0 02/01/2024 2312   EOSABS 0.0 12/15/2022 1340   BASOSABS 0.0 02/01/2024 2312   BASOSABS 0.0 12/15/2022 1340    BMET    Component Value Date/Time   NA 132 (L) 02/04/2024 0257   NA 142 07/07/2022 1140   K 4.1 02/04/2024 0257   CL 95 (L) 02/04/2024 0257   CO2 22 02/04/2024 0257   GLUCOSE 119 (H) 02/04/2024 0257   BUN 34 (H) 02/04/2024 0257   BUN 19 07/07/2022 1140   CREATININE 3.85 (H) 02/04/2024 0257   CREATININE 3.74 (H) 08/23/2017 1407   CALCIUM  8.2 (L) 02/04/2024 0257   CALCIUM  9.4 10/15/2017 1258   GFRNONAA 12 (L) 02/04/2024 0257   GFRNONAA 12 (L) 08/23/2017 1407   GFRAA 8 (L) 07/19/2019 1210   GFRAA 14 (L) 08/23/2017 1407    INR    Component Value Date/Time   INR 1.1 02/01/2024 2312     Intake/Output Summary (Last 24 hours) at 02/04/2024 9167 Last data filed at 02/03/2024 1908 Gross per 24 hour  Intake 100 ml  Output 50 ml  Net 50 ml       Assessment/Plan:  68 y.o. female is 1 day postop, s/p: Left forearm amputation and left upper arm AV fistula ligation   - Overall she is doing fairly well this morning.  She does endorse some pain at her incision sites -Left forearm is bandaged and dry, care per Ortho -Left upper arm incision is bandaged with Ace and dry.   -Okay to use [facility] for dialysis   Ahmed Holster, PA-C [facility] [phone] 02/04/2024 8:32 AM    VASCULAR STAFF ADDENDUM: I have independently interviewed and examined the patient. I agree with the above.  Upper arm bandage uncomfortable for patient so I discontinued this this morning. If upper arm sore, encourage gentle ace wrap. Wrist bandage per orthopedics. Likely will be catheter dependent. Can follow up with us  in the office once she is through this acute episode. Call for questions.  Debby SAILOR. Magda, MD [facility] of [facility] Phone Number: [phone] 02/04/2024 9:13 AM

## 2024-02-04 NOTE — Progress Notes (Addendum)
 [facility] Progress Note   Subjective:   Seen in [facility]. On HD this AM with 2L UFG and tolerating. [facility] working well. No CP/dyspnea, is having arm pain.  Objective Vitals:   02/04/24 0900 02/04/24 0930 02/04/24 1000 02/04/24 1030  BP: (!) 186/91 (!) 170/89 (!) 174/74 (!) 175/79  Pulse: (!) 58 60 69 65  Resp: 12 19 19 19   Temp:      TempSrc:      SpO2: 99% 100% 100% 95%  Weight:       Physical Exam General: Well appearing, NAD. Room air Heart: RRR Lungs: CTAB Abdomen: soft Extremities: B BKA, no stump edema. L arm bandaged s/p hand amputation and L AVF ligation Dialysis Access:  TDC in R chest  Additional Objective Labs: Basic Metabolic Panel: Recent Labs  Lab 02/02/24 1502 02/03/24 0442 02/04/24 0257  NA 140 136 132*  K 5.3* 4.0 4.1  CL 98 96* 95*  CO2 26 27 22   GLUCOSE 76 82 119*  BUN 52* 25* 34*  CREATININE 5.03* 2.98* 3.85*  CALCIUM  8.9 8.6* 8.2*  PHOS 5.3*  --   --    Liver Function Tests: Recent Labs  Lab 02/01/24 2312 02/02/24 1502  AST 22  --   ALT 22  --   ALKPHOS 109  --   BILITOT 0.5  --   PROT 6.8  --   ALBUMIN  2.6* 2.4*   CBC: Recent Labs  Lab 02/01/24 2312 02/02/24 0444 02/02/24 1502 02/03/24 0442 02/04/24 0257  WBC 11.0* 11.5* 11.0* 11.0* 10.5  NEUTROABS 8.9*  --   --   --   --   HGB 8.6* 8.4* 8.1* 8.7* 7.9*  HCT 29.2* 28.1* 26.8* 28.4* 25.9*  MCV 87.4 84.9 84.8 83.5 83.3  PLT 496* 483* 470* 457* 410*   Blood Culture    Component Value Date/Time   SDES BLOOD SITE NOT SPECIFIED 02/01/2024 2312   SPECREQUEST  02/01/2024 2312    BOTTLES DRAWN AEROBIC AND ANAEROBIC Blood Culture adequate volume   CULT  02/01/2024 2312    NO GROWTH 3 DAYS Performed at [facility], [address], [place], [place] [address]    REPTSTATUS PENDING 02/01/2024 2312   Studies/Results: DG C-Arm 1-60 Min Result Date: 02/03/2024 CLINICAL DATA:  Surgery, dialysis catheter.  Mixed field EXAM: DG C-ARM 1-60 MIN FLUOROSCOPY:  Fluoroscopy Time:  49 seconds Radiation Exposure Index (if provided by the fluoroscopic device): 8.05 micro gray Number of Acquired Spot Images: 1 COMPARISON:  None Available. FINDINGS: Single intraoperative view of the chest. Right-sided central venous catheter tip projects over the distal SVC. Enteric tube is present above the carina. IMPRESSION: Right-sided central venous catheter tip projects over the distal SVC. Electronically Signed   By: Greig Pique M.D.   On: 02/03/2024 19:49   Medications:  cefTRIAXone  (ROCEPHIN )  IV 2 g (02/03/24 2225)   metronidazole  500 mg (02/03/24 2346)   vancomycin       amLODipine   5 mg Oral Daily   busPIRone   7.5 mg Oral BID   calcitRIOL   1 mcg Oral Q M,W,F-HD   Chlorhexidine  Gluconate Cloth  6 each Topical Q0600   cinacalcet   90 mg Oral Q M,W,F   heparin   5,000 Units Subcutaneous Q8H   insulin  aspart  0-6 Units Subcutaneous Q4H   metoprolol  succinate  50 mg Oral Daily   mupirocin  ointment  1 Application Nasal BID   pantoprazole   40 mg Oral Daily   sertraline   75 mg  Oral Daily   sodium chloride  flush  3 mL Intravenous Q12H   torsemide   100 mg Oral Q T,Th,S,Su    Dialysis Orders MWF - [facility] 3.5hr, 400/500, EDW 70.5kg, 2K/2.5Ca bath, AVF, no heparin  - Mircera 100mcg Iv q 2 weeks (last given on 11/3)   Assessment/Plan: 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 On IV Vanc, Ceftriaxone , Flagyl .  Dialysis access: Likely steal syndrome component. LUE AVF ligated and TDC placed by [facility] 11/13.  ESRD: Usual MWF schedule - HD now, using new TDC, 2L UFG. HTN/volume: BP high, follow with HD and can resume home meds. Anemia of ESRD: Hgb 7.9 - not due for ESA yet. Secondary HPTH: CorrCa and Phos ok, continue home binders/VDRA. Nutrition: Continue protein supplements for wound healing. T2DM   Izetta Boehringer, PA-C 02/04/2024, 10:58 AM  [employer]

## 2024-02-04 NOTE — Progress Notes (Signed)
 PROGRESS NOTE  Kathryn Beck  DOB: May 03, 1955  PCP: Antonetta Rollene BRAVO, MD FMW:[id number]  DOA: 02/01/2024  LOS: 2 days  Hospital Day: 4  Subjective: Patient was seen and examined this morning. Elderly African-American female.  Seen in dialysis. Underwent left forearm amputation yesterday. Afebrile, blood pressure elevated to 160s to 170s, breathing on room air Labs from this morning with hemoglobin 7.9, WC count 10.5, sodium 132  Brief narrative: Kathryn Beck is a 68 y.o. female with PMH significant for ESRD-HD-MWF, DM2, HTN, HLD, OSA, A-fib no longer on anticoagulation because of GI bleed, CVA, PAD, bilateral BKA status, acquired thrombophilia who also has a left upper extremity dry gangrene that was being monitored by hand surgeon Dr. Brutus as an outpatient.  The gangrene margins have been fairly dry.  However for the last few days, family has noted worsening foul smell as well as maggots present in the inner finger creases.  No reported trauma to the affected area.  Sugars have been high at home.   In the ED, afebrile, hemodynamically stable WBC count 11 Blood cultures drawn CRP 17.4.  Sed rate 72.   Left hand x-ray showing soft tissue gas concerning for gas-forming infection.  Otherwise no radiographic evidence for osteo myelitis.   Started on broad-spectrum antibiotics  Admitted to [facility] Hand surgery and nephrology consulted  Assessment and plan: Left hand ischemia and gangrene Presented with foul-smelling drainage and maggots present in the wound  Was seen by orthopedics Was also seen by vascular surgery because of the concern of AV fistula related ischemia leading to gangrene. 11/13, underwent ligation of left arm AV fistula by vascular surgeon Dr. Magda and left hand I&D and left forearm amputation by hand surgeon Dr. Arlinda Currently on broad-spectrum IV antibiotics Pending culture report Pain regimen --- Scheduled:  --- PRN: Tylenol , oxycodone , fentanyl     ESRD-HD-MWF  Nephrology following.   1113, AV fistula ligated and right IJ tunneled dialysis catheter placed   Chronic diastolic CHF HTN 2D ECHO 05/2023 w/ EF 55% and grade 1 DD Dialysis dependent volume status  Appears euvolemic  Monitor volume status closely  PTA meds-Toprol  50 mg daily, torsemide  100 mg TTSS (on nondialysis days), amlodipine  5 mg daily Continue all   PAF (paroxysmal atrial fibrillation) rate controlled with Toprol  50 mg daily Off anticoagulation secondary to history of GI bleeding On baby aspirin , currently on hold  H/o stroke PAD s/p b/l BKA HLD Aspirin  on hold as above Not on statin??    H/o GI bleeding Noted recent admission in October for GI bleeding after which anticoagulation was held Hemoglobin has remained stable above 8. Continue Chantix Recent Labs    11/01/23 1314 11/02/23 0529 11/11/23 0427 11/11/23 1241 01/17/24 1129 01/18/24 0454 02/01/24 2312 02/02/24 0444 02/02/24 1502 02/03/24 0442 02/04/24 0257  HGB  --    < > 8.7*   < > 7.0*   < > 8.6* 8.4* 8.1* 8.7* 7.9*  MCV  --    < > 80.3   < > 87.3   < > 87.4 84.9 84.8 83.5 83.3  VITAMINB12 1,172*  --  997*  --   --   --   --   --   --   --   --   FOLATE 32.4  --  17.3  --   --   --   --   --   --   --   --   FERRITIN  --   --   --   --  1,205*  --   --   --   --   --   --   TIBC  --   --  NOT CALCULATED  --  175*  --   --   --   --   --   --   IRON  --   --  27*  --  30  --   --   --   --   --   --    < > = values in this interval not displayed.   Type 2 diabetes mellitus Hypoglycemia A1c 6.7 in August 2025 PTA meds-Toujeo  30 units daily, Premeal sliding scale insulin  Blood sugar level was running low yesterday while n.p.o. and hence started on D10 drip.  Blood glucose level improving.  Stop D10 drip today.  Oral intake allowed Currently only on SSI/Accu-Cheks.  Recent Labs  Lab 02/03/24 2036 02/03/24 2318 02/04/24 0424 02/04/24 0743 02/04/24 1402  GLUCAP 81 102* 127* 145*  131*   OSA (obstructive sleep apnea) CPAP  Anxiety/depression BuSpar  7.5 mg twice daily, Zoloft  75 mg daily   Mobility: Has prosthetics for both legs but not yet learned to use them.  PT Orders:   PT Follow up Rec:     Goals of care   Code Status: Full Code     DVT prophylaxis:  heparin  injection 5,000 Units Start: 02/02/24 1400   Antimicrobials: IV Rocephin , vancomycin , Flagyl  Fluid: Stop D10 drip today. Consultants: Orthopedics, nephrology, vascular Family Communication: Family not at bedside  Status: Inpatient Level of care:  Telemetry   Patient is from: Home Needs to continue in-hospital care: Ongoing workup and management Anticipated d/c to: Pending clinical course   Diet:  Diet Order             Diet renal with fluid restriction Fluid restriction: 1200 mL Fluid; Room service appropriate? Yes; Fluid consistency: Thin  Diet effective now                   Scheduled Meds:  amLODipine   5 mg Oral Daily   busPIRone   7.5 mg Oral BID   calcitRIOL   1 mcg Oral Q M,W,F-HD   Chlorhexidine  Gluconate Cloth  6 each Topical Q0600   cinacalcet   90 mg Oral Q M,W,F   heparin   5,000 Units Subcutaneous Q8H   insulin  aspart  0-6 Units Subcutaneous Q4H   metoprolol  succinate  50 mg Oral Daily   mupirocin  ointment  1 Application Nasal BID   pantoprazole   40 mg Oral Daily   sertraline   75 mg Oral Daily   sodium chloride  flush  3 mL Intravenous Q12H   torsemide   100 mg Oral Q T,Th,S,Su    PRN meds: acetaminophen  **OR** acetaminophen , fentaNYL  (SUBLIMAZE ) injection, oxyCODONE , prochlorperazine , senna   Infusions:   cefTRIAXone  (ROCEPHIN )  IV 2 g (02/03/24 2225)   metronidazole  500 mg (02/04/24 1345)   vancomycin       Antimicrobials: Anti-infectives (From admission, onward)    Start     Dose/Rate Route Frequency Ordered Stop   02/03/24 1425  ceFAZolin  (ANCEF ) 2-4 GM/100ML-% IVPB       Note to Pharmacy: Coni Sensor M: cabinet override      02/03/24 1425  02/03/24 1702   02/03/24 0600  ceFAZolin  (ANCEF ) IVPB 2g/100 mL premix        2 g 200 mL/hr over 30 Minutes Intravenous On call to O.R. 02/02/24 2344 02/03/24 1717   02/02/24 2200  cefTRIAXone  (ROCEPHIN ) 2 g in  sodium chloride  0.9 % 100 mL IVPB        2 g 200 mL/hr over 30 Minutes Intravenous Every 24 hours 02/02/24 0241     02/02/24 1200  vancomycin  (VANCOREADY) IVPB 750 mg/150 mL        750 mg 150 mL/hr over 60 Minutes Intravenous Every M-W-F (Hemodialysis) 02/02/24 0251     02/02/24 1000  metroNIDAZOLE  (FLAGYL ) IVPB 500 mg        500 mg 100 mL/hr over 60 Minutes Intravenous Every 12 hours 02/02/24 0241     02/02/24 0000  cefTRIAXone  (ROCEPHIN ) 2 g in sodium chloride  0.9 % 100 mL IVPB  Status:  Discontinued       Placed in And Linked Group   2 g 200 mL/hr over 30 Minutes Intravenous Every 24 hours 02/01/24 2352 02/02/24 0244   02/02/24 0000  metroNIDAZOLE  (FLAGYL ) IVPB 500 mg  Status:  Discontinued       Placed in And Linked Group   500 mg 100 mL/hr over 60 Minutes Intravenous Every 8 hours 02/01/24 2352 02/02/24 0244   02/02/24 0000  vancomycin  (VANCOREADY) IVPB 1250 mg/250 mL        1,250 mg 166.7 mL/hr over 90 Minutes Intravenous  Once 02/01/24 2355 02/02/24 0435       Objective: Vitals:   02/04/24 1206 02/04/24 1323  BP: (!) 183/90 (!) 169/119  Pulse: 78 69  Resp: (!) 8 17  Temp: 98.7 F (37.1 C) (!) 97.5 F (36.4 C)  SpO2: 100% 100%    Intake/Output Summary (Last 24 hours) at 02/04/2024 1413 Last data filed at 02/04/2024 1206 Gross per 24 hour  Intake 100 ml  Output 2050 ml  Net -1950 ml   Filed Weights   02/02/24 1940 02/04/24 0811 02/04/24 1206  Weight: 69 kg 72.3 kg 68.7 kg   Weight change:  Body mass index is 24.45 kg/m.   Physical Exam: General exam: Pleasant, elderly African-American female.  Not in distress Skin: No rashes, lesions or ulcers. HEENT: Atraumatic, normocephalic, no obvious bleeding Lungs: Clear to auscultation bilaterally,   CVS: S1, S2, no murmur,   GI/Abd: Soft, nontender, nondistended, bowel sound present,   CNS: Alert, awake, oriented x 3 Psychiatry: Mood appropriate Extremities: Bilateral prior BKA status..  Left forearm amputation status  Data Review: I have personally reviewed the laboratory data and studies available.  F/u labs ordered Unresulted Labs (From admission, onward)     Start     Ordered   02/02/24 0500  Basic metabolic panel  Daily,   R      02/02/24 0241   02/02/24 0500  CBC  Daily,   R      02/02/24 0241   02/01/24 2351  Aerobic Culture w Gram Stain (superficial specimen)  Once,   URGENT        02/01/24 2350   02/01/24 2206  Urinalysis, w/ Reflex to Culture (Infection Suspected) -Urine, Clean Catch  (Undifferentiated presentation (screening labs and basic nursing orders))  ONCE - URGENT,   URGENT       Question:  Specimen Source  Answer:  Urine, Clean Catch   02/01/24 2206            Signed, Chapman Rota, MD [facility] 02/04/2024

## 2024-02-05 DIAGNOSIS — I96 Gangrene, not elsewhere classified: Secondary | ICD-10-CM | POA: Diagnosis not present

## 2024-02-05 LAB — CBC
HCT: 25.7 % — ABNORMAL LOW (ref 36.0–46.0)
Hemoglobin: 7.8 g/dL — ABNORMAL LOW (ref 12.0–15.0)
MCH: 25.1 pg — ABNORMAL LOW (ref 26.0–34.0)
MCHC: 30.4 g/dL (ref 30.0–36.0)
MCV: 82.6 fL (ref 80.0–100.0)
Platelets: 404 K/uL — ABNORMAL HIGH (ref 150–400)
RBC: 3.11 MIL/uL — ABNORMAL LOW (ref 3.87–5.11)
RDW: 17.8 % — ABNORMAL HIGH (ref 11.5–15.5)
WBC: 8.1 K/uL (ref 4.0–10.5)
nRBC: 1.4 % — ABNORMAL HIGH (ref 0.0–0.2)

## 2024-02-05 LAB — GLUCOSE, CAPILLARY
Glucose-Capillary: 121 mg/dL — ABNORMAL HIGH (ref 70–99)
Glucose-Capillary: 154 mg/dL — ABNORMAL HIGH (ref 70–99)
Glucose-Capillary: 173 mg/dL — ABNORMAL HIGH (ref 70–99)
Glucose-Capillary: 182 mg/dL — ABNORMAL HIGH (ref 70–99)
Glucose-Capillary: 186 mg/dL — ABNORMAL HIGH (ref 70–99)
Glucose-Capillary: 193 mg/dL — ABNORMAL HIGH (ref 70–99)

## 2024-02-05 LAB — BASIC METABOLIC PANEL WITH GFR
Anion gap: 11 (ref 5–15)
BUN: 18 mg/dL (ref 8–23)
CO2: 27 mmol/L (ref 22–32)
Calcium: 8 mg/dL — ABNORMAL LOW (ref 8.9–10.3)
Chloride: 95 mmol/L — ABNORMAL LOW (ref 98–111)
Creatinine, Ser: 2.72 mg/dL — ABNORMAL HIGH (ref 0.44–1.00)
GFR, Estimated: 19 mL/min — ABNORMAL LOW (ref >60)
Glucose, Bld: 171 mg/dL — ABNORMAL HIGH (ref 70–99)
Potassium: 3.8 mmol/L (ref 3.5–5.1)
Sodium: 133 mmol/L — ABNORMAL LOW (ref 135–145)

## 2024-02-05 NOTE — Progress Notes (Signed)
 [facility] Progress Note   Subjective:   Having pain in her hand, otherwise no concerns. Denies SOB, CP, dizziness.   Objective Vitals:   02/04/24 2309 02/05/24 0456 02/05/24 0500 02/05/24 0757  BP: (!) 179/90 (!) 168/83  (!) 159/81  Pulse: 63 61  60  Resp: 15 15  17   Temp: 98.1 F (36.7 C) 97.9 F (36.6 C)  97.8 F (36.6 C)  TempSrc:    Oral  SpO2: 97% 95%  100%  Weight:   64.2 kg    Physical Exam General: Alert female in NAD Heart: RRR, no murmurs Lungs: CTA bilaterally, respirations unlabored Abdomen: Soft, non-distended, +BS Extremities: No stump edema Dialysis Access: TDC in R chest  Additional Objective Labs: Basic Metabolic Panel: Recent Labs  Lab 02/02/24 1502 02/03/24 0442 02/04/24 0257 02/05/24 0423  NA 140 136 132* 133*  K 5.3* 4.0 4.1 3.8  CL 98 96* 95* 95*  CO2 26 27 22 27   GLUCOSE 76 82 119* 171*  BUN 52* 25* 34* 18  CREATININE 5.03* 2.98* 3.85* 2.72*  CALCIUM  8.9 8.6* 8.2* 8.0*  PHOS 5.3*  --   --   --    Liver Function Tests: Recent Labs  Lab 02/01/24 2312 02/02/24 1502  AST 22  --   ALT 22  --   ALKPHOS 109  --   BILITOT 0.5  --   PROT 6.8  --   ALBUMIN  2.6* 2.4*   No results for input(s): LIPASE, AMYLASE in the last 168 hours. CBC: Recent Labs  Lab 02/01/24 2312 02/02/24 0444 02/02/24 1502 02/03/24 0442 02/04/24 0257 02/05/24 0423  WBC 11.0* 11.5* 11.0* 11.0* 10.5 8.1  NEUTROABS 8.9*  --   --   --   --   --   HGB 8.6* 8.4* 8.1* 8.7* 7.9* 7.8*  HCT 29.2* 28.1* 26.8* 28.4* 25.9* 25.7*  MCV 87.4 84.9 84.8 83.5 83.3 82.6  PLT 496* 483* 470* 457* 410* 404*   Blood Culture    Component Value Date/Time   SDES BLOOD SITE NOT SPECIFIED 02/01/2024 2312   SPECREQUEST  02/01/2024 2312    BOTTLES DRAWN AEROBIC AND ANAEROBIC Blood Culture adequate volume   CULT  02/01/2024 2312    NO GROWTH 4 DAYS Performed at [facility], [address] [place], [place] [address]    REPTSTATUS PENDING 02/01/2024  2312    Cardiac Enzymes: No results for input(s): CKTOTAL, CKMB, CKMBINDEX, TROPONINI in the last 168 hours. CBG: Recent Labs  Lab 02/04/24 2018 02/05/24 0012 02/05/24 0452 02/05/24 0755 02/05/24 1211  GLUCAP 155* 173* 182* 154* 186*   Iron Studies: No results for input(s): IRON, TIBC, TRANSFERRIN, FERRITIN in the last 72 hours. @lablastinr3 @ Studies/Results: DG C-Arm 1-60 Min Result Date: 02/03/2024 CLINICAL DATA:  Surgery, dialysis catheter.  Mixed field EXAM: DG C-ARM 1-60 MIN FLUOROSCOPY: Fluoroscopy Time:  49 seconds Radiation Exposure Index (if provided by the fluoroscopic device): 8.05 micro gray Number of Acquired Spot Images: 1 COMPARISON:  None Available. FINDINGS: Single intraoperative view of the chest. Right-sided central venous catheter tip projects over the distal SVC. Enteric tube is present above the carina. IMPRESSION: Right-sided central venous catheter tip projects over the distal SVC. Electronically Signed   By: Greig Pique M.D.   On: 02/03/2024 19:49   Medications:   amLODipine   5 mg Oral Daily   busPIRone   7.5 mg Oral BID   calcitRIOL   1 mcg Oral Q M,W,F-HD   Chlorhexidine  Gluconate Cloth  6  each Topical Q0600   cinacalcet   90 mg Oral Q M,W,F   heparin   5,000 Units Subcutaneous Q8H   insulin  aspart  0-5 Units Subcutaneous QHS   insulin  aspart  0-9 Units Subcutaneous TID WC   metoprolol  succinate  50 mg Oral Daily   mupirocin  ointment  1 Application Nasal BID   pantoprazole   40 mg Oral Daily   sertraline   75 mg Oral Daily   sodium chloride  flush  3 mL Intravenous Q12H   torsemide   100 mg Oral Q T,Th,S,Su    Dialysis Orders: MWF - [facility] 3.5hr, 400/500, EDW 70.5kg, 2K/2.5Ca bath, AVF, no heparin  - Mircera 100mcg Iv q 2 weeks (last given on 11/3)  Assessment/Plan: 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 On IV Vanc, Ceftriaxone , Flagyl .  Dialysis access:  Likely steal syndrome component. LUE AVF ligated and TDC placed by [facility] 11/13.  ESRD: Usual MWF schedule HTN/volume: BP high, home meds resumed Anemia of ESRD: Hgb 7.9 - not due for ESA yet. Follow post op and transfuse PRN Secondary HPTH: CorrCa and Phos ok, continue home binders/VDRA. Nutrition: Continue protein supplements for wound healing. T2DM  Lucie Collet, PA-C 02/05/2024, 12:17 PM  [facility] Pager: [phone]

## 2024-02-05 NOTE — Plan of Care (Signed)

## 2024-02-05 NOTE — Progress Notes (Signed)
 PROGRESS NOTE  Kathryn Beck  DOB: 12/21/1955  PCP: Antonetta Rollene BRAVO, MD FMW:[id number]  DOA: 02/01/2024  LOS: 3 days  Hospital Day: 5  Subjective: Patient was seen and examined this morning. Sitting up in bed.  Not in distress.  Answered bedside.  I called and updated her daughter as well. Afebrile, blood pressure ranging from 150s to 170s systolic Labs this morning with sodium 133, hemoglobin 7.8  Brief narrative: Kathryn Beck is a 68 y.o. female with PMH significant for ESRD-HD-MWF, DM2, HTN, HLD, OSA, A-fib no longer on anticoagulation because of GI bleed, CVA, PAD, bilateral BKA status, acquired thrombophilia who also has a left upper extremity dry gangrene that was being monitored by hand surgeon Dr. Brutus as an outpatient.  The gangrene margins have been fairly dry.  However for the last few days, family has noted worsening foul smell as well as maggots present in the inner finger creases.  No reported trauma to the affected area.  Sugars have been high at home.   In the ED, afebrile, hemodynamically stable WBC count 11 Blood cultures drawn CRP 17.4.  Sed rate 72.   Left hand x-ray showed soft tissue gas concerning for gas-forming infection.  Otherwise no radiographic evidence for osteo myelitis.   Started on broad-spectrum antibiotics  Admitted to [facility] Hand surgery, nephrology and vascular surgery were consulted.  Assessment and plan: Left hand gangrene S/p left forearm amputation and left upper arm AV fistula ligation Presented with foul-smelling drainage and maggots present in the wound  Was seen by orthopedics and vascular surgery Gangrene was progressive likely because of the presence of diabetes, peripheral artery disease as well as vascular steal from presence of AV fistula proximally on left arm. 11/13, underwent ligation of left arm AV fistula by vascular surgeon Dr. Magda and left hand I&D and left forearm amputation by hand surgeon Dr.  Arlinda Initially treated with broad-spectrum IV antibiotics Blood culture and wound culture has not shown any growth 11/14, discussed with hand surgery and pharmacy.  Since the source has been controlled, no further indication of antibiotics and hence antibiotics were stopped. OT eval requested Pain regimen --- Scheduled:  --- PRN: Tylenol , oxycodone , fentanyl    ESRD-HD-MWF  Nephrology following.   11/13, AV fistula ligated and right IJ tunneled dialysis catheter placed   Chronic diastolic CHF HTN 2D ECHO 05/2023 w/ EF 55% and grade 1 DD Dialysis dependent volume status  Appears euvolemic  Monitor volume status closely  PTA meds-Toprol  50 mg daily, torsemide  100 mg TTSS (on nondialysis days), amlodipine  5 mg daily Currently continued on all   PAF (paroxysmal atrial fibrillation) rate controlled with Toprol  50 mg daily Off anticoagulation secondary to history of GI bleeding Baby aspirin  on hold  H/o stroke PAD s/p b/l BKA HLD Aspirin  on hold as above Not on statin??    H/o GI bleeding Noted recent admission in October for GI bleeding after which anticoagulation was held Hemoglobin downtrend noted.  No active bleeding.  Continue to monitor.  Transfuse if less than 7 Recent Labs    11/01/23 1314 11/02/23 0529 11/11/23 0427 11/11/23 1241 01/17/24 1129 01/18/24 0454 02/02/24 0444 02/02/24 1502 02/03/24 0442 02/04/24 0257 02/05/24 0423  HGB  --    < > 8.7*   < > 7.0*   < > 8.4* 8.1* 8.7* 7.9* 7.8*  MCV  --    < > 80.3   < > 87.3   < > 84.9 84.8 83.5 83.3 82.6  VITAMINB12  1,172*  --  997*  --   --   --   --   --   --   --   --   FOLATE 32.4  --  17.3  --   --   --   --   --   --   --   --   FERRITIN  --   --   --   --  1,205*  --   --   --   --   --   --   TIBC  --   --  NOT CALCULATED  --  175*  --   --   --   --   --   --   IRON  --   --  27*  --  30  --   --   --   --   --   --    < > = values in this interval not displayed.   Type 2 diabetes  mellitus Hypoglycemia A1c 6.7 in August 2025 PTA meds-Toujeo  30 units daily, Premeal sliding scale insulin  Currently only on SSI/Accu-Cheks.  Recent Labs  Lab 02/04/24 2018 02/05/24 0012 02/05/24 0452 02/05/24 0755 02/05/24 1211  GLUCAP 155* 173* 182* 154* 186*   OSA (obstructive sleep apnea) CPAP  Anxiety/depression BuSpar  7.5 mg twice daily, Zoloft  75 mg daily   Mobility: Has prosthetics for both legs but not yet learned to use them.  PT Orders:   PT Follow up Rec:     Goals of care   Code Status: Full Code     DVT prophylaxis:  heparin  injection 5,000 Units Start: 02/02/24 1400   Antimicrobials: Not on antibiotics currently Fluid: Not on IV fluid Consultants: Orthopedics, nephrology, vascular Family Communication: Family not at bedside  Status: Inpatient Level of care:  Telemetry   Patient is from: Home Needs to continue in-hospital care: Ongoing workup and management.  On IV pain meds.  Pending OT eval Anticipated d/c to: Pending clinical course.  Hopefully home in 1 to 2 days   Diet:  Diet Order             Diet renal with fluid restriction Fluid restriction: 1200 mL Fluid; Room service appropriate? Yes; Fluid consistency: Thin  Diet effective now                   Scheduled Meds:  amLODipine   5 mg Oral Daily   busPIRone   7.5 mg Oral BID   calcitRIOL   1 mcg Oral Q M,W,F-HD   Chlorhexidine  Gluconate Cloth  6 each Topical Q0600   cinacalcet   90 mg Oral Q M,W,F   heparin   5,000 Units Subcutaneous Q8H   insulin  aspart  0-5 Units Subcutaneous QHS   insulin  aspart  0-9 Units Subcutaneous TID WC   metoprolol  succinate  50 mg Oral Daily   mupirocin  ointment  1 Application Nasal BID   pantoprazole   40 mg Oral Daily   sertraline   75 mg Oral Daily   sodium chloride  flush  3 mL Intravenous Q12H   torsemide   100 mg Oral Q T,Th,S,Su    PRN meds: acetaminophen  **OR** acetaminophen , fentaNYL  (SUBLIMAZE ) injection, oxyCODONE , prochlorperazine , senna    Infusions:     Antimicrobials: Anti-infectives (From admission, onward)    Start     Dose/Rate Route Frequency Ordered Stop   02/03/24 1425  ceFAZolin  (ANCEF ) 2-4 GM/100ML-% IVPB       Note to Pharmacy: Coni Sensor M: cabinet override  02/03/24 1425 02/03/24 1702   02/03/24 0600  ceFAZolin  (ANCEF ) IVPB 2g/100 mL premix        2 g 200 mL/hr over 30 Minutes Intravenous On call to O.R. 02/02/24 2344 02/03/24 1717   02/02/24 2200  cefTRIAXone  (ROCEPHIN ) 2 g in sodium chloride  0.9 % 100 mL IVPB        2 g 200 mL/hr over 30 Minutes Intravenous Every 24 hours 02/02/24 0241 02/04/24 2242   02/02/24 1200  vancomycin  (VANCOREADY) IVPB 750 mg/150 mL        750 mg 150 mL/hr over 60 Minutes Intravenous Every M-W-F (Hemodialysis) 02/02/24 0251 02/04/24 2359   02/02/24 1000  metroNIDAZOLE  (FLAGYL ) IVPB 500 mg        500 mg 100 mL/hr over 60 Minutes Intravenous Every 12 hours 02/02/24 0241 02/04/24 2359   02/02/24 0000  cefTRIAXone  (ROCEPHIN ) 2 g in sodium chloride  0.9 % 100 mL IVPB  Status:  Discontinued       Placed in And Linked Group   2 g 200 mL/hr over 30 Minutes Intravenous Every 24 hours 02/01/24 2352 02/02/24 0244   02/02/24 0000  metroNIDAZOLE  (FLAGYL ) IVPB 500 mg  Status:  Discontinued       Placed in And Linked Group   500 mg 100 mL/hr over 60 Minutes Intravenous Every 8 hours 02/01/24 2352 02/02/24 0244   02/02/24 0000  vancomycin  (VANCOREADY) IVPB 1250 mg/250 mL        1,250 mg 166.7 mL/hr over 90 Minutes Intravenous  Once 02/01/24 2355 02/02/24 0435       Objective: Vitals:   02/05/24 0456 02/05/24 0757  BP: (!) 168/83 (!) 159/81  Pulse: 61 60  Resp: 15 17  Temp: 97.9 F (36.6 C) 97.8 F (36.6 C)  SpO2: 95% 100%    Intake/Output Summary (Last 24 hours) at 02/05/2024 1437 Last data filed at 02/04/2024 2100 Gross per 24 hour  Intake 360 ml  Output --  Net 360 ml   Filed Weights   02/04/24 0811 02/04/24 1206 02/05/24 0500  Weight: 72.3 kg 68.7  kg 64.2 kg   Weight change:  Body mass index is 22.84 kg/m.   Physical Exam: General exam: Pleasant, elderly African-American female.  Not in distress. Skin: No rashes, lesions or ulcers. HEENT: Atraumatic, normocephalic, no obvious bleeding Lungs: Clear to auscultation bilaterally,  CVS: S1, S2, no murmur,   GI/Abd: Soft, nontender, nondistended, bowel sound present,   CNS: Alert, awake, oriented x 3 Psychiatry: Mood appropriate Extremities: Bilateral prior BKA status. Left forearm amputation status  Data Review: I have personally reviewed the laboratory data and studies available.  F/u labs ordered Unresulted Labs (From admission, onward)     Start     Ordered   02/01/24 2351  Aerobic Culture w Gram Stain (superficial specimen)  Once,   URGENT        02/01/24 2350   02/01/24 2206  Urinalysis, w/ Reflex to Culture (Infection Suspected) -Urine, Clean Catch  (Undifferentiated presentation (screening labs and basic nursing orders))  ONCE - URGENT,   URGENT       Question:  Specimen Source  Answer:  Urine, Clean Catch   02/01/24 2206            Signed, Chapman Rota, MD [facility] 02/05/2024

## 2024-02-06 ENCOUNTER — Other Ambulatory Visit (HOSPITAL_COMMUNITY): Payer: Self-pay

## 2024-02-06 ENCOUNTER — Encounter: Payer: Self-pay | Admitting: Family

## 2024-02-06 DIAGNOSIS — I96 Gangrene, not elsewhere classified: Secondary | ICD-10-CM | POA: Diagnosis not present

## 2024-02-06 LAB — GLUCOSE, CAPILLARY
Glucose-Capillary: 156 mg/dL — ABNORMAL HIGH (ref 70–99)
Glucose-Capillary: 192 mg/dL — ABNORMAL HIGH (ref 70–99)
Glucose-Capillary: 194 mg/dL — ABNORMAL HIGH (ref 70–99)

## 2024-02-06 LAB — CULTURE, BLOOD (ROUTINE X 2)
Culture: NO GROWTH
Special Requests: ADEQUATE

## 2024-02-06 MED ORDER — CHLORHEXIDINE GLUCONATE CLOTH 2 % EX PADS
6.0000 | MEDICATED_PAD | Freq: Every day | CUTANEOUS | Status: DC
  Administered 2024-02-06: 6 via TOPICAL

## 2024-02-06 MED ORDER — SENNA 8.6 MG PO TABS
1.0000 | ORAL_TABLET | Freq: Every day | ORAL | 0 refills | Status: DC | PRN
  Filled 2024-02-06: qty 30, 30d supply, fill #0

## 2024-02-06 MED ORDER — OXYCODONE HCL 5 MG PO TABS
5.0000 mg | ORAL_TABLET | ORAL | 0 refills | Status: DC | PRN
  Filled 2024-02-06: qty 20, 4d supply, fill #0

## 2024-02-06 NOTE — TOC Transition Note (Signed)
 Transition of Care [facility]) - Discharge Note   Patient Details  Name: Kathryn Beck MRN: [id number] Date of Birth: 02-14-1956  [facility] CM/SW Contact:  Robynn Eileen Hoose, RN Phone Number: 02/06/2024, 11:38 AM   Clinical Narrative:   Patient is being discharged today. Cory with [facility] made aware.          Patient Goals and CMS Choice            Discharge Placement                       Discharge Plan and Services Additional resources added to the After Visit Summary for                                       Social Drivers of Health (SDOH) Interventions SDOH Screenings   Food Insecurity: No Food Insecurity (02/05/2024)  Housing: Low Risk  (02/05/2024)  Transportation Needs: No Transportation Needs (02/05/2024)  Utilities: Not At Risk (02/05/2024)  Alcohol Screen: Low Risk  (05/03/2023)  Depression (PHQ2-9): Low Risk  (12/21/2023)  Recent Concern: Depression (PHQ2-9) - High Risk (11/24/2023)  Financial Resource Strain: Low Risk  (11/24/2023)  Physical Activity: Inactive (11/24/2023)  Social Connections: Socially Integrated (02/05/2024)  Recent Concern: Social Connections - Moderately Isolated (11/25/2023)  Stress: Stress Concern Present (11/24/2023)  Tobacco Use: Low Risk  (02/03/2024)  Health Literacy: Adequate Health Literacy (05/03/2023)  Recent Concern: Health Literacy - Inadequate Health Literacy (02/08/2023)     Readmission Risk Interventions    01/02/2024   12:17 PM 12/16/2023    3:19 PM 12/02/2023   12:48 PM  Readmission Risk Prevention Plan  Transportation Screening Complete Complete Complete  PCP or Specialist Appt within 3-5 Days   Not Complete  HRI or Home Care Consult   Complete  Social Work Consult for Recovery Care Planning/Counseling   Complete  Palliative Care Screening   Not Applicable  Medication Review Oceanographer) Complete Complete Complete  HRI or Home Care Consult Complete Complete   SW Recovery  Care/Counseling Consult Complete Complete   Palliative Care Screening Not Applicable Not Applicable   Skilled Nursing Facility Not Applicable Not Applicable

## 2024-02-06 NOTE — Plan of Care (Signed)

## 2024-02-06 NOTE — Evaluation (Signed)
 Occupational Therapy Evaluation Patient Details Name: Kathryn Beck MRN: [id number] DOB: 07-20-1955 Today's Date: 02/06/2024   History of Present Illness   Pt is a 68 y.o. female presenting 11/11 from home with wound with maggots on her L hand. 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 S/p placement of R internal jugular tunneled dialysis catheter 11/13; s/p L forearm amputation. PMH includes: bilateral BKA, anemia, arthritis, CHF, ESRD on HD MWF, HTN, memory loss, obesity, OSA, stroke, HTN, and DM II.     Clinical Impressions PTA, pt lived with spouse who provides most of her history this eval. Typically, spouse assists with most ADL and all transfers. Recently using bed pan at home and transitioned from using slide board to no equipment with husband lifting her in and OOB to and from wheelchair. Has hoyer as well. Upon eval, pt with generalized weakness, decreased knowledge of precautions, decreased carryover during new learning, and needing up to max A for basic mobility. Pt appears close to her functional baseline on eval, but both she and husband could benefit from further education in the home setting, thus, recommending HHOT at discharge.      If plan is discharge home, recommend the following:   A lot of help with walking and/or transfers;Two people to help with walking and/or transfers;A lot of help with bathing/dressing/bathroom;Two people to help with bathing/dressing/bathroom;Assistance with cooking/housework;Assist for transportation;Help with stairs or ramp for entrance     Functional Status Assessment   Patient has had a recent decline in their functional status and demonstrates the ability to make significant improvements in function in a reasonable and predictable amount of time.     Equipment Recommendations   None recommended by OT (pt has needed equipment)     Recommendations for Other Services   PT consult     Precautions/Restrictions    Precautions Precautions: Fall Restrictions Weight Bearing Restrictions Per Provider Order: Yes LUE Weight Bearing Per Provider Order: Non weight bearing     Mobility Bed Mobility Overal bed mobility: Needs Assistance Bed Mobility: Supine to Sit, Sit to Supine     Supine to sit: Min assist (with physical asisst and cues for sequence) Sit to supine: Mod assist   General bed mobility comments: MOd A to bring BLE back into bed and reposition    Transfers Overall transfer level: Needs assistance                 General transfer comment: simulated via scoot along EOB with max A      Balance                                           ADL either performed or assessed with clinical judgement   ADL Overall ADL's : Needs assistance/impaired Eating/Feeding: Set up;Sitting;Bed level;Minimal assistance   Grooming: Minimal assistance;Sitting;Moderate assistance   Upper Body Bathing: Minimal assistance;Sitting   Lower Body Bathing: Total assistance;Sitting/lateral leans   Upper Body Dressing : Minimal assistance;Sitting;Moderate assistance   Lower Body Dressing: Total assistance;Sitting/lateral leans   Toilet Transfer: Maximal assistance;Transfer board Toilet Transfer Details (indicate cue type and reason): simulated along EOB                 Vision Patient Visual Report: No change from baseline       Perception         Praxis  Pertinent Vitals/Pain Pain Assessment Pain Assessment: Faces Faces Pain Scale: Hurts little more Pain Location: L UE Pain Descriptors / Indicators: Aching Pain Intervention(s): Limited activity within patient's tolerance, Monitored during session     Extremity/Trunk Assessment Upper Extremity Assessment Upper Extremity Assessment: Generalized weakness;LUE deficits/detail LUE Deficits / Details: new forearm amputation. Able to flex nd extend elbow, shoulder flexion 0-120, some pronation and supination.  edema in elbow area   Lower Extremity Assessment Lower Extremity Assessment: Generalized weakness (slightly weaker on R with hip flexion EOB)       Communication Communication Communication: No apparent difficulties   Cognition Arousal: Alert Behavior During Therapy: WFL for tasks assessed/performed Cognition: Cognition impaired   Orientation impairments: Time, Situation (pt seems unclear on timing of surgery and admission) Awareness: Intellectual awareness impaired, Online awareness impaired, Intellectual awareness intact Memory impairment (select all impairments): Short-term memory, Declarative long-term memory (per chart, memory impairment at baseline) Attention impairment (select first level of impairment): Sustained attention Executive functioning impairment (select all impairments): Organization, Sequencing, Problem solving OT - Cognition Comments: needing dense cues for sequencing through transfers and repeated cues during new learning                 Following commands: Impaired Following commands impaired: Follows one step commands with increased time     Cueing  General Comments   Cueing Techniques: Verbal cues;Gestural cues;Tactile cues      Exercises     Shoulder Instructions      Home Living Family/patient expects to be discharged to:: Private residence Living Arrangements: Spouse/significant other Available Help at Discharge: Family;Available PRN/intermittently Type of Home: House Home Access: Stairs to enter (supposed to be getting ramp) Entrance Stairs-Number of Steps: 2 Entrance Stairs-Rails: None Home Layout: Two level;Able to live on main level with bedroom/bathroom     Bathroom Shower/Tub: Sponge bathes at baseline   Bathroom Toilet: Standard     Home Equipment: BSC/3in1;Wheelchair - Forensic Psychologist (2 wheels);Cane - single point;Grab bars - tub/shower;Wheelchair - power;Hand held shower head;Other (comment);Hospital bed (hoyer)           Prior Functioning/Environment Prior Level of Function : Needs assist             Mobility Comments: husband reports he picks her up and puts her in chair; used to use slide board until L arm got infection ADLs Comments: Assist for bathing, dressing, grooming, and IADL's. Pt can reported feed herself and complete basic grooming tasks.    OT Problem List: Decreased strength;Impaired balance (sitting and/or standing);Decreased activity tolerance;Decreased cognition;Decreased safety awareness;Decreased knowledge of use of DME or AE;Decreased knowledge of precautions;Pain   OT Treatment/Interventions: Self-care/ADL training;Therapeutic exercise;DME and/or AE instruction;Therapeutic activities;Patient/family education;Balance training;Cognitive remediation/compensation      OT Goals(Current goals can be found in the care plan section)   Acute Rehab OT Goals Patient Stated Goal: get better OT Goal Formulation: With patient Time For Goal Achievement: 02/20/24 Potential to Achieve Goals: Good   OT Frequency:  Min 2X/week    Co-evaluation              AM-PAC OT 6 Clicks Daily Activity     Outcome Measure Help from another person eating meals?: A Little Help from another person taking care of personal grooming?: A Lot Help from another person toileting, which includes using toliet, bedpan, or urinal?: Total Help from another person bathing (including washing, rinsing, drying)?: A Lot Help from another person to put on and taking off regular upper body clothing?: A  Lot Help from another person to put on and taking off regular lower body clothing?: Total 6 Click Score: 11   End of Session Equipment Utilized During Treatment: Gait belt Nurse Communication: Mobility status  Activity Tolerance: Patient tolerated treatment well Patient left: in bed;with call bell/phone within reach;with bed alarm set  OT Visit Diagnosis: Unsteadiness on feet (R26.81);Muscle weakness  (generalized) (M62.81);Other symptoms and signs involving cognitive function;Pain Pain - Right/Left: Left Pain - part of body: Arm                Time: 9055-8985 OT Time Calculation (min): 30 min Charges:  OT General Charges $OT Visit: 1 Visit OT Evaluation $OT Eval Moderate Complexity: 1 Mod OT Treatments $Self Care/Home Management : 8-22 mins  Elma JONETTA Lebron FREDERICK, OTR/L [facility] Office: [phone]   Elma JONETTA Lebron 02/06/2024, 11:37 AM

## 2024-02-06 NOTE — Discharge Summary (Signed)
 Physician Discharge Summary  Kathryn Beck FMW:[id number] DOB: 04/28/1955 DOA: 02/01/2024  PCP: Antonetta Rollene BRAVO, MD  Admit date: 02/01/2024 Discharge date: 02/06/2024  Admitted from: Home Discharge disposition: Home with home health PT OT  Recommendations at discharge:  Per vascular surgery, if left arm is sore, encourage gentle Ace wrap. Per orthopedic surgery, to keep the left amputation stump clean dry and intact until orthopedic follow-up in 1 week. Exercise caution with use of opioids for pain control. Aspirin  to be on hold till orthopedic follow-up   Subjective: Patient was seen and examined this morning. Propped up in bed.  Not in distress.  Family at bedside. Seen by OT this morning. Afebrile, hemodynamically stable, breathing on room air. Blood sugar level elevated to 190s this morning  Brief narrative: Kathryn Beck is a 68 y.o. female with PMH significant for ESRD-HD-MWF, DM2, HTN, HLD, OSA, A-fib no longer on anticoagulation because of GI bleed, CVA, PAD, bilateral BKA status, acquired thrombophilia who also has a left upper extremity dry gangrene that was being monitored by hand surgeon Dr. Brutus as an outpatient.  The gangrene margins have been fairly dry.  However for the last few days, family has noted worsening foul smell as well as maggots present in the inner finger creases.  No reported trauma to the affected area.  Sugars have been high at home.   In the ED, afebrile, hemodynamically stable WBC count 11 Blood cultures drawn CRP 17.4.  Sed rate 72.   Left hand x-ray showed soft tissue gas concerning for gas-forming infection.  Otherwise no radiographic evidence for osteo myelitis.   Started on broad-spectrum antibiotics  Admitted to [facility] Hand surgery, nephrology and vascular surgery were consulted.  Hospital course: Left hand gangrene S/p left forearm amputation and left upper arm AV fistula ligation -11/13 Presented with foul-smelling drainage  and maggots present in the wound  Was seen by orthopedics and vascular surgery Gangrene was progressive likely because of the presence of diabetes, peripheral artery disease as well as vascular steal from presence of AV fistula proximally on left arm. 11/13, underwent ligation of left arm AV fistula by vascular surgeon Dr. Magda and left hand I&D and left forearm amputation by hand surgeon Dr. Arlinda Initially treated with broad-spectrum IV antibiotics Blood culture and wound culture has not shown any growth 11/14, discussed with hand surgery and pharmacy.  Since the source has been controlled, no further indication of antibiotics and hence antibiotics were stopped. OT eval was obtained. Plan to discharge home today with as needed Tylenol  and oxycodone . Per vascular surgery, if left arm is sore, encourage gentle Ace wrap. Per orthopedic surgery, to keep the left amputation stump clean dry and intact until orthopedic follow-up in 1 week.   ESRD-HD-MWF  Nephrology following.   11/13, AV fistula ligated and right IJ tunneled dialysis catheter placed   Chronic diastolic CHF HTN 2D ECHO 05/2023 w/ EF 55% and grade 1 DD Dialysis dependent volume status  Appears euvolemic  Monitor volume status closely  PTA meds-Toprol  50 mg daily, torsemide  100 mg TTSS (on nondialysis days), amlodipine  5 mg daily Currently continued on all   PAF (paroxysmal atrial fibrillation) rate controlled with Toprol  50 mg daily Off anticoagulation secondary to history of GI bleeding Aspirin  to be on hold till orthopedic follow-up  H/o stroke PAD s/p b/l BKA HLD Aspirin  on hold as above Not on statin??    H/o GI bleeding Noted recent admission in October for GI bleeding after which  anticoagulation was held Hemoglobin downtrend noted.  But remained stable between 7 and 8 for last 48 hours. Continue to monitor as an outpatient. Recent Labs    11/01/23 1314 11/02/23 0529 11/11/23 0427 11/11/23 1241  01/17/24 1129 01/18/24 0454 02/02/24 0444 02/02/24 1502 02/03/24 0442 02/04/24 0257 02/05/24 0423  HGB  --    < > 8.7*   < > 7.0*   < > 8.4* 8.1* 8.7* 7.9* 7.8*  MCV  --    < > 80.3   < > 87.3   < > 84.9 84.8 83.5 83.3 82.6  VITAMINB12 1,172*  --  997*  --   --   --   --   --   --   --   --   FOLATE 32.4  --  17.3  --   --   --   --   --   --   --   --   FERRITIN  --   --   --   --  1,205*  --   --   --   --   --   --   TIBC  --   --  NOT CALCULATED  --  175*  --   --   --   --   --   --   IRON  --   --  27*  --  30  --   --   --   --   --   --    < > = values in this interval not displayed.   Type 2 diabetes mellitus Hypoglycemia A1c 6.7 in August 2025 PTA meds-Toujeo  30 units daily, Premeal sliding scale insulin  Continue the same at home. Recent Labs  Lab 02/05/24 1633 02/05/24 2034 02/06/24 0015 02/06/24 0411 02/06/24 0818  GLUCAP 121* 193* 192* 194* 156*   OSA (obstructive sleep apnea) CPAP  Anxiety/depression BuSpar  7.5 mg twice daily, Zoloft  75 mg daily   Mobility: Has prosthetics for both legs but not yet learned to use them.  Goals of care   Code Status: Full Code   Diet:  Diet Order             Diet general           Diet renal with fluid restriction Fluid restriction: 1200 mL Fluid; Room service appropriate? Yes; Fluid consistency: Thin  Diet effective now                   Nutritional status:  Body mass index is 22.84 kg/m.  Nutrition Problem: Increased nutrient needs Etiology: wound healing Signs/Symptoms:  (gangrene LUE)  Wounds:  - Wound 02/03/24 1831 Surgical Closed Surgical Incision Arm Left (Active)  Date First Assessed/Time First Assessed: 02/03/24 1831   Primary Wound Type: Surgical  Secondary Wound Type - Surgical: Closed Surgical Incision  Location: Arm  Location Orientation: Left    Assessments 02/03/2024  7:15 PM 02/05/2024  8:00 PM  Site / Wound Assessment Dressing in place / Unable to assess Dressing in place / Unable to  assess  Drainage Amount None None  Dressing Type Compression wrap Compression wrap  Dressing Status Clean, Dry, Intact Clean, Dry, Intact     No associated orders.     Wound 02/03/24 1831 Surgical Closed Surgical Incision Arm Left (Active)  Date First Assessed/Time First Assessed: 02/03/24 1831   Primary Wound Type: Surgical  Secondary Wound Type - Surgical: Closed Surgical Incision  Location: Arm  Location Orientation: Left  Assessments 02/03/2024  7:15 PM 02/05/2024  3:00 PM  Site / Wound Assessment Dressing in place / Unable to assess Clean;Dry  Drainage Amount None None  Dressing Type Compression wrap Gauze (Comment);Abdominal pads  Dressing Changed -- New  Dressing Status Clean, Dry, Intact Clean, Dry, Intact     No associated orders.     Wound 02/03/24 1854 Surgical Closed Surgical Incision Chest (Active)  Date First Assessed/Time First Assessed: 02/03/24 1854   Primary Wound Type: Surgical  Secondary Wound Type - Surgical: Closed Surgical Incision  Location: Chest    Assessments 02/03/2024  7:15 PM 02/05/2024  8:30 AM  Site / Wound Assessment Dressing in place / Unable to assess Dressing in place / Unable to assess  Drainage Amount None None  Dressing Type Gauze (Comment);Tape dressing Gauze (Comment)  Dressing Status Clean, Dry, Intact Clean, Dry, Intact     No associated orders.    Discharge Medications:   Allergies as of 02/06/2024       Reactions   Ace Inhibitors Anaphylaxis, Swelling   Penicillins Itching, Swelling   Has tolerated cefazolin  on multiple occasions    Statins Other (See Comments)   Elevated LFT's   Albuterol  Swelling        Medication List     PAUSE taking these medications    Aspirin  Low Dose 81 MG chewable tablet Wait to take this until your doctor or other care provider tells you to start again. Generic drug: aspirin  Chew 162 mg by mouth daily.       TAKE these medications    acetaminophen  500 MG tablet Commonly known as:  TYLENOL  Take 1,000 mg by mouth every 6 (six) hours as needed for mild pain (pain score 1-3).   amLODipine  10 MG tablet Commonly known as: NORVASC  Take 0.5 tablets (5 mg total) by mouth daily.   busPIRone  7.5 MG tablet Commonly known as: BUSPAR  Take 1 tablet (7.5 mg total) by mouth 2 (two) times daily.   calcitRIOL  0.5 MCG capsule Commonly known as: ROCALTROL  Take 2 capsules (1 mcg total) by mouth every Monday, Wednesday, and Friday with hemodialysis.   cinacalcet  30 MG tablet Commonly known as: SENSIPAR  Take 3 tablets (90 mg total) by mouth every Monday, Wednesday, and Friday.   ezetimibe  10 MG tablet Commonly known as: ZETIA  Take 1 tablet (10 mg total) by mouth daily.   insulin  aspart 100 UNIT/ML injection Commonly known as: novoLOG  Inject 0-6 Units into the skin 3 (three) times daily with meals. CBG 70 - 120: 0 units  CBG 121 - 150: 0 units  CBG 151 - 200: 1 unit  CBG 201-250: 2 units  CBG 251-300: 3 units  CBG 301-350: 4 units  CBG 351-400: 5 units  CBG > 400: Give 10 units and call MD   metoprolol  succinate 50 MG 24 hr tablet Commonly known as: TOPROL -XL TAKE ONE TABLET BY MOUTH ONCE DAILY WITH FOOD (TAKE AFTER DIALYSIS)   nitroGLYCERIN  0.2 mg/hr patch Commonly known as: NITRODUR - Dosed in mg/24 hr Place 1 patch (0.2 mg total) onto the skin daily.   ondansetron  4 MG tablet Commonly known as: ZOFRAN  Take 4 mg by mouth every 8 (eight) hours as needed for nausea or vomiting.   oxyCODONE  5 MG immediate release tablet Commonly known as: Oxy IR/ROXICODONE  Take 1 tablet (5 mg total) by mouth every 4 (four) hours as needed for moderate pain (pain score 4-6).   pantoprazole  40 MG tablet Commonly known as: PROTONIX  Take 1 tablet (40  mg total) by mouth daily.   Rena-Vite Rx 1 MG Tabs Take 1 tablet by mouth daily.   senna 8.6 MG Tabs tablet Commonly known as: SENOKOT Take 1 tablet (8.6 mg total) by mouth daily as needed for mild constipation or moderate  constipation.   sertraline  50 MG tablet Commonly known as: ZOLOFT  Take 1.5 tablets (75 mg total) by mouth daily. Take one and a half tablets once daily   torsemide  100 MG tablet Commonly known as: DEMADEX  Take 1 tablet (100 mg total) by mouth every Tuesday, Thursday, Saturday, and Sunday. Non-dialysis days   Toujeo Max SoloStar 300 UNIT/ML Solostar Pen Generic drug: insulin glargine (2 Unit Dial) Inject 30 Units into the skin daily. May need to slowly increase the dose depending upon your blood sugar, follow-up with PCP   [name] 500 MG chewable tablet Generic drug: sucroferric oxyhydroxide Chew 1-2 tablets (500-1,000 mg total) by mouth See admin instructions. Take 1000mg (2 tablets) by mouth with meals and 500mg (1 tablet) with snacks               Discharge Care Instructions  (From admission, onward)           Start     Ordered   02/06/24 0000  Discharge wound care:        11 /16/25 1128             Follow ups:    Follow-up Information     Antonetta Rollene BRAVO, MD Follow up.   Specialty: Family Medicine Contact information: [address], [address] [place] [address] [phone]                 Discharge Instructions:   Discharge Instructions     Call MD for:  difficulty breathing, headache or visual disturbances   Complete by: As directed    Call MD for:  extreme fatigue   Complete by: As directed    Call MD for:  hives   Complete by: As directed    Call MD for:  persistant dizziness or light-headedness   Complete by: As directed    Call MD for:  persistant nausea and vomiting   Complete by: As directed    Call MD for:  severe uncontrolled pain   Complete by: As directed    Call MD for:  temperature >100.4   Complete by: As directed    Diet general   Complete by: As directed    Discharge instructions   Complete by: As directed    Recommendations at discharge:   Per vascular surgery, if left arm is sore, encourage gentle  Ace wrap.  Per orthopedic surgery, to keep the left amputation stump clean dry and intact until orthopedic follow-up in 1 week.  Exercise caution with use of opioids for pain control.  Aspirin  to be on hold till orthopedic follow-up  PDMP reviewed this encounter.   Opioid taper instructions: It is important to wean off of your opioid medication as soon as possible. If you do not need pain medication after your surgery it is ok to stop day one. Opioids include: Codeine , Hydrocodone (Norco, Vicodin), Oxycodone (Percocet, oxycontin ) and hydromorphone  amongst others.  Long term and even short term use of opiods can cause: Increased pain response Dependence Constipation Depression Respiratory depression And more.  Withdrawal symptoms can include Flu like symptoms Nausea, vomiting And more Techniques to manage these symptoms Hydrate well Eat regular healthy meals Stay active Use relaxation techniques(deep breathing, meditating, yoga) Do Not substitute  Alcohol to help with tapering If you have been on opioids for less than two weeks and do not have pain than it is ok to stop all together.  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 Maintain the same interval or time between taking each dose and first decrease the dose.  Cut the total daily intake of opioids by one tablet each day Next start to increase the time between doses. The last dose that should be eliminated is the evening dose.        General discharge instructions: Follow with Primary MD Antonetta Rollene BRAVO, MD in 7 days  Please request your PCP  to go over your hospital tests, procedures, radiology results at the follow up. Please get your medicines reviewed and adjusted.  Your PCP may decide to repeat certain labs or tests as needed. Do not drive, operate heavy machinery, perform activities at heights, swimming or participation in water  activities or provide  baby sitting services if your were admitted for syncope or siezures until you have seen by Primary MD or a Neurologist and advised to do so again. [place]  Controlled Substance Reporting System database was reviewed. Do not drive, operate heavy machinery, perform activities at heights, swim, participate in water  activities or provide baby-sitting services while on medications for pain, sleep and mood until your outpatient physician has reevaluated you and advised to do so again.  You are strongly recommended to comply with the dose, frequency and duration of prescribed medications. Activity: As tolerated with Full fall precautions use walker/cane & assistance as needed Avoid using any recreational substances like cigarette, tobacco, alcohol, or non-prescribed drug. If you experience worsening of your admission symptoms, develop shortness of breath, life threatening emergency, suicidal or homicidal thoughts you must seek medical attention immediately by calling 911 or calling your MD immediately  if symptoms less severe. You must read complete instructions/literature along with all the possible adverse reactions/side effects for all the medicines you take and that have been prescribed to you. Take any new medicine only after you have completely understood and accepted all the possible adverse reactions/side effects.  Wear Seat belts while driving. You were cared for by a hospitalist during your hospital stay. If you have any questions about your discharge medications or the care you received while you were in the hospital after you are discharged, you can call the unit and ask to speak with the hospitalist or the covering physician. Once you are discharged, your primary care physician will handle any further medical issues. Please note that NO REFILLS for any discharge medications will be authorized once you are discharged, as it is imperative that you return to your primary care physician (or establish  a relationship with a primary care physician if you do not have one).   Discharge wound care:   Complete by: As directed    Increase activity slowly   Complete by: As directed        Discharge Exam:   Vitals:   02/05/24 1504 02/05/24 1932 02/06/24 0329 02/06/24 1016  BP: (!) 148/74 128/64 (!) 158/75 (!) 165/76  Pulse: 63 64 65 66  Resp: 17 14 15 16   Temp: 98.4 F (36.9 C) 98.2 F (36.8 C) 97.6 F (36.4 C) 98 F (36.7 C)  TempSrc: Oral   Oral  SpO2: 96% 98% 97% 96%  Weight:        Body mass index is 22.84 kg/m.  General exam: Pleasant, elderly African-American female.  Not in distress. Skin: No rashes, lesions or ulcers. HEENT: Atraumatic, normocephalic, no obvious bleeding Lungs: Clear to auscultation bilaterally,  CVS: S1, S2, no murmur,   GI/Abd: Soft, nontender, nondistended, bowel sound present,   CNS: Alert, awake, oriented x 3 Psychiatry: Mood appropriate Extremities: Bilateral prior BKA status. Left forearm amputation status.  Amputation site remains bandaged.   The results of significant diagnostics from this hospitalization (including imaging, microbiology, ancillary and laboratory) are listed below for reference.    Procedures and Diagnostic Studies:   DG C-Arm 1-60 Min Result Date: 02/03/2024 CLINICAL DATA:  Surgery, dialysis catheter.  Mixed field EXAM: DG C-ARM 1-60 MIN FLUOROSCOPY: Fluoroscopy Time:  49 seconds Radiation Exposure Index (if provided by the fluoroscopic device): 8.05 micro gray Number of Acquired Spot Images: 1 COMPARISON:  None Available. FINDINGS: Single intraoperative view of the chest. Right-sided central venous catheter tip projects over the distal SVC. Enteric tube is present above the carina. IMPRESSION: Right-sided central venous catheter tip projects over the distal SVC. Electronically Signed   By: Greig Pique M.D.   On: 02/03/2024 19:49     Labs:   Basic Metabolic Panel: Recent Labs  Lab 02/02/24 0444 02/02/24 1502  02/03/24 0442 02/04/24 0257 02/05/24 0423  NA 142 140 136 132* 133*  K 5.7* 5.3* 4.0 4.1 3.8  CL 100 98 96* 95* 95*  CO2 26 26 27 22 27   GLUCOSE 114* 76 82 119* 171*  BUN 46* 52* 25* 34* 18  CREATININE 4.51* 5.03* 2.98* 3.85* 2.72*  CALCIUM  8.9 8.9 8.6* 8.2* 8.0*  PHOS  --  5.3*  --   --   --    GFR Estimated Creatinine Clearance: 18.8 mL/min (A) (by C-G formula based on SCr of 2.72 mg/dL (H)). Liver Function Tests: Recent Labs  Lab 02/01/24 2312 02/02/24 1502  AST 22  --   ALT 22  --   ALKPHOS 109  --   BILITOT 0.5  --   PROT 6.8  --   ALBUMIN  2.6* 2.4*   No results for input(s): LIPASE, AMYLASE in the last 168 hours. No results for input(s): AMMONIA in the last 168 hours. Coagulation profile Recent Labs  Lab 02/01/24 2312  INR 1.1    CBC: Recent Labs  Lab 02/01/24 2312 02/02/24 0444 02/02/24 1502 02/03/24 0442 02/04/24 0257 02/05/24 0423  WBC 11.0* 11.5* 11.0* 11.0* 10.5 8.1  NEUTROABS 8.9*  --   --   --   --   --   HGB 8.6* 8.4* 8.1* 8.7* 7.9* 7.8*  HCT 29.2* 28.1* 26.8* 28.4* 25.9* 25.7*  MCV 87.4 84.9 84.8 83.5 83.3 82.6  PLT 496* 483* 470* 457* 410* 404*   Cardiac Enzymes: No results for input(s): CKTOTAL, CKMB, CKMBINDEX, TROPONINI in the last 168 hours. BNP: Invalid input(s): POCBNP CBG: Recent Labs  Lab 02/05/24 1633 02/05/24 2034 02/06/24 0015 02/06/24 0411 02/06/24 0818  GLUCAP 121* 193* 192* 194* 156*   D-Dimer No results for input(s): DDIMER in the last 72 hours. Hgb A1c No results for input(s): HGBA1C in the last 72 hours. Lipid Profile No results for input(s): CHOL, HDL, LDLCALC, TRIG, CHOLHDL, LDLDIRECT in the last 72 hours. Thyroid  function studies No results for input(s): TSH, T4TOTAL, T3FREE, THYROIDAB in the last 72 hours.  Invalid input(s): FREET3 Anemia work up No results for input(s): VITAMINB12, FOLATE, FERRITIN, TIBC, IRON, RETICCTPCT in the last 72  hours. Microbiology Recent Results (from the past 240 hours)  Blood Culture (routine x 2)     Status: None (Preliminary result)   Collection Time: 02/01/24 10:11 PM   Specimen: BLOOD  Result Value Ref Range Status   Specimen Description BLOOD RIGHT ANTECUBITAL  Final   Special Requests   Final    BOTTLES DRAWN AEROBIC AND ANAEROBIC Blood Culture results may not be optimal due to an inadequate volume of blood received in culture bottles   Culture   Final    NO GROWTH 4 DAYS Performed at [facility], [address] [place], [place] [address]    Report Status PENDING  Incomplete  Blood Culture (routine x 2)     Status: None   Collection Time: 02/01/24 11:12 PM   Specimen: BLOOD  Result Value Ref Range Status   Specimen Description BLOOD SITE NOT SPECIFIED  Final   Special Requests   Final    BOTTLES DRAWN AEROBIC AND ANAEROBIC Blood Culture adequate volume   Culture   Final    NO GROWTH 5 DAYS Performed at [facility], [address] [place], [place] [address]    Report Status 02/06/2024 FINAL  Final  Surgical PCR screen     Status: Abnormal   Collection Time: 02/03/24  1:01 AM   Specimen: Nasal Mucosa; Nasal Swab  Result Value Ref Range Status   MRSA, PCR NEGATIVE NEGATIVE Final   Staphylococcus aureus POSITIVE (A) NEGATIVE Final    Comment: (NOTE) The Xpert SA Assay (FDA approved for NASAL specimens in patients 32 years of age and older), is one component of a comprehensive surveillance program. It is not intended to diagnose infection nor to guide or monitor treatment. Performed at [facility], [address] [place], [place] [address]     Time coordinating discharge: 45 minutes  Signed: Leith Hedlund  [facility] 02/06/2024, 11:28 AM

## 2024-02-06 NOTE — Progress Notes (Signed)
 [facility] Progress Note   Subjective:   Upset about loss of her hand this morning, but feeling well otherwise. Denies SOB, CP, dizziness.   Objective Vitals:   02/05/24 1504 02/05/24 1932 02/06/24 0329 02/06/24 1016  BP: (!) 148/74 128/64 (!) 158/75 (!) 165/76  Pulse: 63 64 65 66  Resp: 17 14 15 16   Temp: 98.4 F (36.9 C) 98.2 F (36.8 C) 97.6 F (36.4 C) 98 F (36.7 C)  TempSrc: Oral   Oral  SpO2: 96% 98% 97% 96%  Weight:       Physical Exam  General: Alert female in NAD Heart: RRR, no murmurs Lungs: CTA bilaterally, respirations unlabored Abdomen: Soft, non-distended, +BS Extremities: No stump edema Dialysis Access: TDC in R chest  Additional Objective Labs: Basic Metabolic Panel: Recent Labs  Lab 02/02/24 1502 02/03/24 0442 02/04/24 0257 02/05/24 0423  NA 140 136 132* 133*  K 5.3* 4.0 4.1 3.8  CL 98 96* 95* 95*  CO2 26 27 22 27   GLUCOSE 76 82 119* 171*  BUN 52* 25* 34* 18  CREATININE 5.03* 2.98* 3.85* 2.72*  CALCIUM  8.9 8.6* 8.2* 8.0*  PHOS 5.3*  --   --   --    Liver Function Tests: Recent Labs  Lab 02/01/24 2312 02/02/24 1502  AST 22  --   ALT 22  --   ALKPHOS 109  --   BILITOT 0.5  --   PROT 6.8  --   ALBUMIN  2.6* 2.4*   No results for input(s): LIPASE, AMYLASE in the last 168 hours. CBC: Recent Labs  Lab 02/01/24 2312 02/02/24 0444 02/02/24 1502 02/03/24 0442 02/04/24 0257 02/05/24 0423  WBC 11.0* 11.5* 11.0* 11.0* 10.5 8.1  NEUTROABS 8.9*  --   --   --   --   --   HGB 8.6* 8.4* 8.1* 8.7* 7.9* 7.8*  HCT 29.2* 28.1* 26.8* 28.4* 25.9* 25.7*  MCV 87.4 84.9 84.8 83.5 83.3 82.6  PLT 496* 483* 470* 457* 410* 404*   Blood Culture    Component Value Date/Time   SDES BLOOD SITE NOT SPECIFIED 02/01/2024 2312   SPECREQUEST  02/01/2024 2312    BOTTLES DRAWN AEROBIC AND ANAEROBIC Blood Culture adequate volume   CULT  02/01/2024 2312    NO GROWTH 5 DAYS Performed at [facility], [address],  [place], [place] [address]    REPTSTATUS 02/06/2024 FINAL 02/01/2024 2312    Cardiac Enzymes: No results for input(s): CKTOTAL, CKMB, CKMBINDEX, TROPONINI in the last 168 hours. CBG: Recent Labs  Lab 02/05/24 1633 02/05/24 2034 02/06/24 0015 02/06/24 0411 02/06/24 0818  GLUCAP 121* 193* 192* 194* 156*   Iron Studies: No results for input(s): IRON, TIBC, TRANSFERRIN, FERRITIN in the last 72 hours. @lablastinr3 @ Studies/Results: No results found. Medications:   amLODipine   5 mg Oral Daily   busPIRone   7.5 mg Oral BID   calcitRIOL   1 mcg Oral Q M,W,F-HD   Chlorhexidine  Gluconate Cloth  6 each Topical Q0600   cinacalcet   90 mg Oral Q M,W,F   heparin   5,000 Units Subcutaneous Q8H   insulin  aspart  0-5 Units Subcutaneous QHS   insulin  aspart  0-9 Units Subcutaneous TID WC   metoprolol  succinate  50 mg Oral Daily   mupirocin  ointment  1 Application Nasal BID   pantoprazole   40 mg Oral Daily   sertraline   75 mg Oral Daily   sodium chloride  flush  3 mL Intravenous Q12H   torsemide   100 mg  Oral Q T,Th,S,Su    Dialysis Orders:  MWF - [facility] 3.5hr, 400/500, EDW 70.5kg, 2K/2.5Ca bath, AVF, no heparin  - Mircera 100mcg Iv q 2 weeks (last given on 11/3)  Assessment/Plan: 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 On IV Vanc, Ceftriaxone , Flagyl .  Dialysis access: Likely steal syndrome component. LUE AVF ligated and TDC placed by [facility] 11/13.  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 Follow post op and transfuse PRN Secondary HPTH: CorrCa and Phos ok, continue home binders/VDRA. Nutrition: Continue protein supplements for wound healing. T2DM  Lucie Collet, PA-C 02/06/2024, 10:33 AM  [facility] Pager: [phone]

## 2024-02-07 ENCOUNTER — Telehealth: Payer: Self-pay

## 2024-02-07 ENCOUNTER — Encounter (HOSPITAL_COMMUNITY): Admission: RE | Payer: Self-pay | Source: Home / Self Care

## 2024-02-07 ENCOUNTER — Ambulatory Visit (HOSPITAL_COMMUNITY): Admission: RE | Admit: 2024-02-07 | Source: Home / Self Care | Admitting: Internal Medicine

## 2024-02-07 LAB — CULTURE, BLOOD (ROUTINE X 2): Culture: NO GROWTH

## 2024-02-07 SURGERY — EGD (ESOPHAGOGASTRODUODENOSCOPY)
Anesthesia: Choice

## 2024-02-07 NOTE — Progress Notes (Addendum)
 Late note entry 11/17 0904am  D/c over weekend noted. Contacted out-pt HD clinic, [facility], to inform of pt d/c and that pt should have returned this morning. D/c summ and last neph note faxed over at this time. No further support needed.   Lavanda Enrique Manganaro Dialysis Navigator [phone]

## 2024-02-07 NOTE — Transitions of Care (Post Inpatient/ED Visit) (Signed)
   02/07/2024  Name: Kathryn Beck MRN: [id number] DOB: 1955-05-20  Today's TOC FU Call Status: Today's TOC FU Call Status:: Unsuccessful Call (1st Attempt) Unsuccessful Call (1st Attempt) Date: 02/07/24  Attempted to reach the patient regarding the most recent Inpatient/ED visit.  Unable to leave a voicemail message as automated voice states mailbox is full and cannot accept messages.  Follow Up Plan: Additional outreach attempts will be made to reach the patient to complete the Transitions of Care (Post Inpatient/ED visit) call.   Richerd Fish, RN, BSN, CCM [facility], [facility] Management Coordinator Direct Dial : [phone]

## 2024-02-08 ENCOUNTER — Encounter: Payer: Self-pay | Admitting: Family Medicine

## 2024-02-08 ENCOUNTER — Telehealth: Payer: Self-pay | Admitting: Family Medicine

## 2024-02-08 ENCOUNTER — Telehealth: Payer: Self-pay

## 2024-02-08 NOTE — Transitions of Care (Post Inpatient/ED Visit) (Signed)
   02/08/2024  Name: Kathryn Beck MRN: [id number] DOB: [birth year]  Today's TOC FU Call Status: Today's TOC FU Call Status:: Unsuccessful Call (2nd Attempt) Unsuccessful Call (2nd Attempt) Date: 02/08/24  Attempted to reach the patient regarding the most recent Inpatient/ED visit. Mailbox is full and unable to leave a HIPAA appropriate voicemail message.  Follow Up Plan: Additional outreach attempts will be made to reach the patient to complete the Transitions of Care (Post Inpatient/ED visit) call.   Richerd Fish, RN, BSN, CCM [facility], [facility] Management Coordinator Direct Dial : [phone]

## 2024-02-08 NOTE — Telephone Encounter (Signed)
 Copied from CRM [id number]. Topic: General - Other >> Feb 08, 2024  9:29 AM Antony RAMAN wrote: Reason for CRM: applied of electric scooter a month or two ago and haven't heard back from them, doesn't remember name of company

## 2024-02-09 ENCOUNTER — Other Ambulatory Visit: Payer: Self-pay

## 2024-02-09 ENCOUNTER — Telehealth: Payer: Self-pay

## 2024-02-09 DIAGNOSIS — R531 Weakness: Secondary | ICD-10-CM

## 2024-02-09 LAB — SURGICAL PATHOLOGY

## 2024-02-09 MED ORDER — UNABLE TO FIND: 0 refills | Status: DC

## 2024-02-09 NOTE — Transitions of Care (Post Inpatient/ED Visit) (Signed)
   02/09/2024  Name: Kathryn Beck MRN: [id number] DOB: 09-18-1955  Today's TOC FU Call Status: Today's TOC FU Call Status:: Unsuccessful Call (3rd Attempt) Unsuccessful Call (3rd Attempt) Date: 02/09/24  Attempted to reach the patient regarding the most recent Inpatient/ED visit.  Left a HIPAA approved voicemail message to phone number provided in demographics per DPR.    Follow Up Plan: No further outreach attempts will be made at this time. We have been unable to contact the patient.  Richerd Fish, RN, BSN, CCM [facility], [facility] Management Coordinator Direct Dial : [phone]

## 2024-02-09 NOTE — Progress Notes (Unsigned)
   Kathryn Beck - 68 y.o. female MRN [id number]  Date of birth: 06-Sep-1955  Office Visit Note: Visit Date: 02/10/2024 PCP: Antonetta Rollene BRAVO, MD Referred by: Antonetta Rollene BRAVO, MD  Subjective:  HPI: Kathryn Beck is a 68 y.o. female who presents today for follow up 1 week status post left hand irrigation and debridement. Left forearm amputation.  Pertinent ROS were reviewed with the patient and found to be negative unless otherwise specified above in HPI.   Assessment & Plan: Visit Diagnoses: No diagnosis found.  Plan: ***  Follow-up: No follow-ups on file.   Meds & Orders: No orders of the defined types were placed in this encounter.  No orders of the defined types were placed in this encounter.    Procedures: No procedures performed       Objective:   Vital Signs: There were no vitals taken for this visit.  Ortho Exam ***  Imaging: No results found.   Jkwon Treptow Afton Alderton, M.D. [facility], Hand Surgery

## 2024-02-09 NOTE — Telephone Encounter (Signed)
 The order was sent to [facility] and [facility] but was cancelled due to her being admitted in a facility. They said when/if she is no longer in facility to call them back at [phone] and let them know to open the work order back up but that if she still had skilled facility as her primary coverage and in a facility, they could not process the order

## 2024-02-10 ENCOUNTER — Ambulatory Visit: Admitting: Orthopedic Surgery

## 2024-02-10 DIAGNOSIS — S58912S Complete traumatic amputation of left forearm, level unspecified, sequela: Secondary | ICD-10-CM

## 2024-02-10 DIAGNOSIS — Z89122 Acquired absence of left wrist: Secondary | ICD-10-CM

## 2024-02-14 ENCOUNTER — Encounter: Payer: Self-pay | Admitting: Family

## 2024-02-14 ENCOUNTER — Telehealth: Payer: Self-pay

## 2024-02-14 DIAGNOSIS — Z89511 Acquired absence of right leg below knee: Secondary | ICD-10-CM

## 2024-02-14 DIAGNOSIS — T8149XA Infection following a procedure, other surgical site, initial encounter: Secondary | ICD-10-CM

## 2024-02-21 ENCOUNTER — Other Ambulatory Visit: Payer: Self-pay | Admitting: *Deleted

## 2024-02-21 DIAGNOSIS — D638 Anemia in other chronic diseases classified elsewhere: Secondary | ICD-10-CM

## 2024-02-21 DIAGNOSIS — D472 Monoclonal gammopathy: Secondary | ICD-10-CM

## 2024-02-21 NOTE — Progress Notes (Deleted)
 [address]ryn 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 She was last seen by Pleasant Barefoot PA-C on 05/21/2022, but was lost to follow-up since that time.  ***  Recent hospitalizations and notable medical events include the following: Continuing ESRD with hemodialysis on Monday, Wednesday, and Friday Hospitalized 02/01/2024 to 02/06/2024: Left hand gangrene, s/p left forearm amputation and AV fistula ligation Hospitalized for new onset atrial fibrillation in August 2025, started on Eliquis  at that time.  After being started on Eliquis , she had several episodes of acute blood loss anemia/GI hemorrhage thought secondary to diverticular bleeding (hospitalized for this on 4 occasions, with discharge dates on 12/07/2023, 12/18/2023, 01/02/2024, and 01/19/2024).  She required multiple PRBC transfusions during this time.  Anticoagulation (for A-fib) was initially held, but was cleared by GI to restart Eliquis .  However, patient's daughter had concerns regarding risk of bleeding from Eliquis , and patient was therefore switched to aspirin  81 mg daily.***  At today's visit, she reports feeling ***.  She  reports ***% energy and ***% appetite.   ***She  is maintaining stable weight at this time.  NORMOCYTIC ANEMIA: ***She denies any recurrent rectal bleeding after stopping Eliquis .  *** No melena. She reports significant fatigue fatigue as well as some dyspnea on exertion.*** ***Chest pain *** She has occasional lightheadedness and near syncopal episodes when her blood pressure drops during dialysis.*** *** She denies any pica,  restless legs, or headaches. ***Oral iron or IV iron with dialysis?  ***  QUESTIONABLE MGUS: ***She she denies any new bone pain or recent fractures.  *** *** She denies any B symptoms such as fever, chills, night sweats, unintentional weight loss. *** She has chronic diabetic neuropathy. *** No new masses or lymphadenopathy per her report.   ASSESSMENT & PLAN:  1.  IgG Kappa MGUS, questionable - Patient with a history of ESRD on dialysis, work-up for monoclonal gammopathy on 08/09/2017 showed an M spike of 0.1 g/dL of IgG kappa monoclonal protein.   - M-Spike has been intermittently elevated, peaked at 0.4 in August 2019 - has been undetectable since April 2021 - Immunofixation initially showed biclonal IgG kappa, has since showed polyclonal gammopathy and was negative in 2022 and 2023 - Bone marrow biopsy done on 03/03/2019 showed slightly hypercellular marrow (50% with trilineage hematopoiesis) polytypic plasmacytosis. - Skeletal survey done on 06/20/2021 showed no lytic lesions. - Most recent MGUS/myeloma labs (09/29/2022) were UNREMARKABLE: Immunofixation showing POLYCLONAL increase in 1 or more immunoglobulins. SPEP negative for M spike. Stable elevation in free light chain ratio at 1.85 (elevated kappa 202.5, elevated lambda 109.6), in keeping with ESRD. - MGUS/myeloma labs obtained today (02/22/2024) are PENDING.*** - No bone pain, B symptoms, or neurologic changes. *** She has chronic diabetic neuropathy. - DIFFERENTIAL DIAGNOSIS includes MGUS versus polyclonal gammopathy *** - PLAN: Follow results of MGUS/myeloma panel above.  If any abnormalities, we will recheck in 3 to 6 months.  Otherwise, no further workup indicated at this time.***   2.  ESRD on dialysis - Secondary to a long history of diabetes, hypertension, obesity related glomerulopathy. - PLAN: Continue dialysis with nephrology follow-up   3.  Normocytic  anemia # Anemia of ESRD # Anemia due to blood loss - She receives  Procrit  with dialysis, which is being managed by her nephrologist.*** - Hospitalized for new onset atrial fibrillation in August 2025, started on Eliquis  at that time.  After being started on Eliquis , she had several episodes of acute blood loss anemia/GI hemorrhage.  After multiple EGD and colonoscopy, her GI bleeding was felt to be secondary to diverticular bleeding (hospitalized for this on 4 occasions, with discharge dates on 12/07/2023, 12/18/2023, 01/02/2024, and 01/19/2024).  She required multiple PRBC transfusions during this time.  Anticoagulation (for A-fib) was initially held, but was cleared by GI to restart Eliquis .  However, patient's daughter had concerns regarding risk of bleeding from Eliquis , and patient was therefore switched to aspirin  81 mg daily.*** - She denies any recurrent rectal bleeding or melena since stopping Eliquis .  *** - *** Symptoms *** - *** Oral iron ***  - Labs today (02/22/2024): *** - PLAN: *** TBD.  *** - Continue Procrit  via nephrologist.***   4.  Elevated ferritin *** TBD *** - Moderately elevated ferritin since at least 2012, with marked increase from 2021 onwards - Most recent iron panel (09/29/2022): Ferritin elevated at 824, iron saturation 31 %.  CMP shows normal LFTs. - She will intermittently receive IV iron at dialysis - No family history of hemochromatosis.  Hemochromatosis DNA panel (10/02/2021) negative. - No personal history of liver disease or alcohol use - Ferritin is difficult to interpret marker of iron overload in CKD patients and is highly variable, especially in those with ESRD. - PLAN: Suspect elevated ferritin related to ESRD and recent osteomyelitis, surgeries, and ongoing inflammation.  *** - No indication for liver imaging at this time, unless patient has any abnormal LFTs. - Repeat iron panel at follow-up in 1 year.  PLAN SUMMARY: *** TBD *** >> *** >> ***     REVIEW OF SYSTEMS: ***  Review of Systems - Oncology   PHYSICAL EXAM:   ECOG PERFORMANCE STATUS: {CHL ONC ECOG ED:[id number]} *** There were no vitals filed for this visit. There were no vitals filed for this visit. 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  Last Assessment & Plan:   Multiple polyps with surveillance due in 2021.  IMO SNOMED Dx Update Oct 2024     History of pneumonia 2019   Hypoxia 04/03/2019   Memory loss    Mixed hyperlipidemia    Not currently working due to disabled status 06/13/2018  Formatting of this note might be different from the original.  Last Assessment & Plan:   Formatting of this note might be different from the original.  Reports being  disabled since April/May 2019 , hospitalized then in a niursing home, now on dialysis and is unable to work     Obesity    Pancreatitis    Peritoneal dialysis catheter in place    Pneumonia due to COVID-19 virus 04/02/2019   Sleep apnea    Noncompliant with CPAP   Stroke (HCC)    mini stroke   Type 2 diabetes mellitus (HCC)    Past Surgical History:  Procedure Laterality Date   ABDOMINAL AORTOGRAM W/LOWER EXTREMITY N/A 04/30/2022    Procedure: ABDOMINAL AORTOGRAM W/LOWER EXTREMITY;  Surgeon: Magda Debby SAILOR, MD;  [facility];  Service: Cardiovascular;  Laterality: N/A;   ABDOMINAL AORTOGRAM W/LOWER EXTREMITY N/A 07/21/2022   Procedure: ABDOMINAL AORTOGRAM W/LOWER EXTREMITY;  Surgeon: Serene Gaile ORN, MD;  Location: [facility];  Service: Cardiovascular;  Laterality: N/A;   ABDOMINAL HYSTERECTOMY     ACHILLES TENDON LENGTHENING  08/15/2022   Procedure: ACHILLES TENDON LENGTHENING;  Surgeon: Silva Juliene SAUNDERS, DPM;  Location: [facility] OR;  Service: Podiatry;;   AMPUTATION Right 05/29/2022   Procedure: RIGHT BELOW THE KNEE AMPUTATION;  Surgeon: Harden Jerona GAILS, MD;  Location: [facility] OR;  Service: Orthopedics;  Laterality: Right;   AMPUTATION Left 09/04/2022   Procedure: AMPUTATION FOOT, serial irrigation;  Surgeon: Joya Stabs, DPM;  Location: [facility] OR;  Service: Podiatry;  Laterality: Left;  Surgical team to do block   AMPUTATION Left 10/07/2022   Procedure: LEFT BELOW KNEE AMPUTATION;  Surgeon: Harden Jerona GAILS, MD;  Location: [facility] OR;  Service: Orthopedics;  Laterality: Left;   AMPUTATION Left 02/03/2024   Procedure: AMPUTATION, HAND;  Surgeon: Arlinda Buster, MD;  Location: [facility] OR;  Service: Orthopedics;  Laterality: Left;  LEFT HAND AMPUTATION   AMPUTATION FINGER Left 09/29/2023   Procedure: AMPUTATION, FINGER;  Surgeon: Harden Jerona GAILS, MD;  Location: [facility] OR;  Service: Orthopedics;  Laterality: Left;  LEFT HAND LONG FINGER RAY AMPUTATION   AV FISTULA PLACEMENT Left 09/02/2017   Procedure: creation of left arm ARTERIOVENOUS (AV) FISTULA;  Surgeon: Serene Gaile ORN, MD;  Location: [facility] OR;  Service: Vascular;  Laterality: Left;   COLONOSCOPY  2008   Dr. Harvey: normal    COLONOSCOPY N/A 12/18/2016   Dr. Harvey: multiple tubular adenomas, internal hemorrhoids. Surveillance in 3 years    COLONOSCOPY N/A 12/05/2023   Procedure: COLONOSCOPY;  Surgeon: Cindie Carlin POUR, DO;  [facility]: [facility];  Service: Endoscopy;   Laterality: N/A;   COLONOSCOPY N/A 12/17/2023   Procedure: COLONOSCOPY;  Surgeon: Shaaron Lamar HERO, MD;  [facility]: [facility];  Service: Endoscopy;  Laterality: N/A;   COLONOSCOPY N/A 01/01/2024   Procedure: COLONOSCOPY;  Surgeon: Cindie Carlin POUR, DO;  [facility];  Service: Endoscopy;  Laterality: N/A;   ESOPHAGEAL DILATION N/A 10/13/2015   Procedure: ESOPHAGEAL DILATION;  Surgeon: Claudis RAYMOND Rivet, MD;  [facility];  Service: Endoscopy;  Laterality: N/A;   ESOPHAGEAL DILATION N/A 12/03/2023   Procedure: DILATION, ESOPHAGUS;  Surgeon: Cindie Carlin POUR, DO;  [facility];  Service: Endoscopy;  Laterality: N/A;   ESOPHAGOGASTRODUODENOSCOPY N/A 10/13/2015   Dr. Rivet: chronic gastritis on path, no H.pylori. Empiric dilation    ESOPHAGOGASTRODUODENOSCOPY N/A 12/18/2016   Dr. Harvey: mild gastritis. [facility] study revealed uncontrolled GERD. Dysphagia secondary to uncontrolled  reflux   ESOPHAGOGASTRODUODENOSCOPY N/A 12/03/2023   Procedure: EGD (ESOPHAGOGASTRODUODENOSCOPY);  Surgeon: Cindie Carlin POUR, DO;  [facility];  Service: Endoscopy;  Laterality: N/A;   FOOT SURGERY Bilateral    nerve     INCISION AND DRAINAGE OF WOUND Left 10/30/2023   Procedure: IRRIGATION AND DEBRIDEMENT WOUND;  Surgeon: Arlinda Buster, MD;  Location: [facility];  Service: Orthopedics;  Laterality: Left;  LEFT HAND WOUND   INSERTION OF DIALYSIS CATHETER N/A 02/03/2024   Procedure: INSERTION OF DIALYSIS CATHETER;  Surgeon: Magda Debby SAILOR, MD;  Location: [facility] OR;  Service: Vascular;  Laterality: N/A;   LEFT HEART CATH AND CORONARY ANGIOGRAPHY N/A 12/29/2018   Procedure: LEFT HEART CATH AND CORONARY ANGIOGRAPHY;  Surgeon: Dann Candyce RAMAN, MD;  Location: [facility];  Service: Cardiovascular;  Laterality: N/A;   LIGATION OF ARTERIOVENOUS  FISTULA Left 02/03/2024   Procedure: LIGATION OF ARTERIOVENOUS  FISTULA;  Surgeon: Magda Debby SAILOR, MD;  Location: [facility];  Service:  Vascular;  Laterality: Left;   LOWER EXTREMITY ANGIOGRAPHY Right 05/04/2022   Procedure: Lower Extremity Angiography;  Surgeon: Lanis Fonda BRAVO, MD;  Location: [facility];  Service: Cardiovascular;  Laterality: Right;   LUNG BIOPSY     MASS EXCISION Right 01/09/2013   Procedure: EXCISION OF NEOPLASM OF RIGHT  AXILLA  AND EXCISION OF NEOPLASM OF LEFT AXILLA;  Surgeon: Oneil DELENA Budge, MD;  Location: [facility];  Service: General;  Laterality: Right;  procedure end @ 08:23   MYRINGOTOMY WITH TUBE PLACEMENT Bilateral 04/28/2017   Procedure: BILATERAL MYRINGOTOMY WITH TUBE PLACEMENT;  Surgeon: Karis Clunes, MD;  Location: [facility];  Service: ENT;  Laterality: Bilateral;   PERIPHERAL VASCULAR BALLOON ANGIOPLASTY Right 05/04/2022   Procedure: PERIPHERAL VASCULAR BALLOON ANGIOPLASTY;  Surgeon: Lanis Fonda BRAVO, MD;  Location: [facility]AS[facility] CV LAB;  Service: Cardiovascular;  Laterality: Right;  PT   PERIPHERAL VASCULAR INTERVENTION Right 05/04/2022   Procedure: PERIPHERAL VASCULAR INTERVENTION;  Surgeon: Lanis Fonda BRAVO, MD;  Location: [facility] INVASIVE CV LAB;  Service: Cardiovascular;  Laterality: Right;  SFA   PERIPHERAL VASCULAR INTERVENTION Left 07/21/2022   Procedure: PERIPHERAL VASCULAR INTERVENTION;  Surgeon: Serene Gaile ORN, MD;  [facility];  Service: Cardiovascular;  Laterality: Left;   REVISION OF ARTERIOVENOUS GORETEX GRAFT Left 05/04/2018   Procedure: TRANSPOSITION OF CEPHALIC VEIN ARTERIOVENOUS FISTULA LEFT ARM;  Surgeon: Oris Krystal FALCON, MD;  Location: [facility];  Service: Vascular;  Laterality: Left;   SAVORY DILATION N/A 12/18/2016   Procedure: SAVORY DILATION;  Surgeon: Harvey Margo CROME, MD;  [facility];  Service: Endoscopy;  Laterality: N/A;   TRANSMETATARSAL AMPUTATION Left 08/15/2022   Procedure: TRANSMETATARSAL AMPUTATION;  Surgeon: Silva Juliene SAUNDERS, DPM;  Location: [facility] OR;  Service: Podiatry;  Laterality: Left;    SOCIAL HISTORY:  Social History   Socioeconomic History    Marital status: Married    Spouse name: Maisy Newport   Number of children: 2   Years of education: 12   Highest education level: Some college, no degree  Occupational History   Occupation: retired   Tobacco Use   Smoking status: Never    Passive exposure: Never   Smokeless tobacco: Never   Tobacco comments:    Verified by Daughter, [name]  Vaping Use   Vaping status: Never Used  Substance and Sexual Activity   Alcohol use: No   Drug use: No   Sexual activity: Not Currently    Partners: Male  Other Topics Concern   Not on file  Social History Narrative   Lives alone with husband    Right handed   Caffeine-1/2 daily   Social Drivers of Health   Financial Resource Strain: Low Risk  (11/24/2023)   Overall Financial Resource Strain (CARDIA)    Difficulty of Paying Living Expenses: Not hard at all  Food Insecurity: No Food Insecurity (02/05/2024)   Hunger Vital Sign    Worried About Running Out of Food in the Last Year: Never true    Ran Out of Food in the Last Year: Never true  Transportation Needs: No Transportation Needs (02/05/2024)   PRAPARE - Administrator, Civil Service (Medical): No    Lack of Transportation (Non-Medical): No  Physical Activity: Inactive (11/24/2023)   Exercise Vital Sign    Days of Exercise per Week: 0 days    Minutes of Exercise per Session: Not on file  Stress: Stress Concern Present (11/24/2023)   [employer] of Occupational Health - Occupational Stress Questionnaire    Feeling of Stress: To some extent  Social Connections: Socially Integrated (02/05/2024)   Social Connection and Isolation Panel    Frequency of Communication with Friends and Family: More than three times a week    Frequency of Social Gatherings with Friends and Family: Not on file    Attends Religious Services: More than 4 times per year    Active Member of [employer] or Organizations: Yes    Attends Banker Meetings: 1 to 4 times per year     Marital Status: Married  Recent Concern: Social Connections - Moderately Isolated (11/25/2023)   Social Connection and Isolation Panel    Frequency of Communication with Friends and Family: Three times a week    Frequency of Social Gatherings with Friends and Family: Three times a week    Attends Religious Services: Patient declined    Active Member of Clubs or Organizations: No    Attends Banker Meetings: Never    Marital Status: Married  Catering Manager Violence: Not At Risk (02/05/2024)   Humiliation, Afraid, Rape, and Kick questionnaire    Fear of Current or Ex-Partner: No    Emotionally Abused: No    Physically Abused: No    Sexually Abused: No    FAMILY HISTORY:  Family History  Problem Relation Age of Onset   Hypertension Father    Hypercholesterolemia Father    Arthritis Father    Hypertension Sister    Hypercholesterolemia Sister    Breast cancer Sister    Hypertension Sister    Colon cancer Neg Hx    Colon polyps Neg Hx     CURRENT MEDICATIONS:  Outpatient Encounter Medications as of 02/22/2024  Medication Sig Note   acetaminophen  (TYLENOL ) 500 MG tablet Take 1,000 mg by mouth every 6 (six) hours as needed for mild pain (pain score 1-3).    amLODipine  (NORVASC ) 10 MG tablet Take 0.5 tablets (5 mg total) by mouth daily.    [Paused] ASPIRIN  LOW DOSE 81 MG chewable tablet Chew 162 mg by mouth daily.    B Complex-C-Folic Acid  (RENA-VITE RX) 1 MG TABS Take 1 tablet by mouth daily.    busPIRone  (BUSPAR ) 7.5 MG tablet Take 1 tablet (7.5 mg total) by mouth 2 (two) times daily.    calcitRIOL  (ROCALTROL ) 0.5 MCG capsule Take 2 capsules (1 mcg total) by mouth every Monday, Wednesday, and Friday with hemodialysis.    cinacalcet  (SENSIPAR ) 30 MG tablet Take  3 tablets (90 mg total) by mouth every Monday, Wednesday, and Friday.    ezetimibe  (ZETIA ) 10 MG tablet Take 1 tablet (10 mg total) by mouth daily.    insulin  aspart (NOVOLOG ) 100 UNIT/ML injection Inject 0-6  Units into the skin 3 (three) times daily with meals. CBG 70 - 120: 0 units  CBG 121 - 150: 0 units  CBG 151 - 200: 1 unit  CBG 201-250: 2 units  CBG 251-300: 3 units  CBG 301-350: 4 units  CBG 351-400: 5 units  CBG > 400: Give 10 units and call MD    insulin  glargine, 2 Unit Dial , (TOUJEO  MAX SOLOSTAR) 300 UNIT/ML Solostar Pen Inject 30 Units into the skin daily. May need to slowly increase the dose depending upon your blood sugar, follow-up with PCP    metoprolol  succinate (TOPROL -XL) 50 MG 24 hr tablet TAKE ONE TABLET BY MOUTH ONCE DAILY WITH FOOD (TAKE AFTER DIALYSIS)    nitroGLYCERIN  (NITRODUR - DOSED IN MG/24 HR) 0.2 mg/hr patch Place 1 patch (0.2 mg total) onto the skin daily. (Patient not taking: Reported on 02/02/2024)    ondansetron  (ZOFRAN ) 4 MG tablet Take 4 mg by mouth every 8 (eight) hours as needed for nausea or vomiting.    oxyCODONE  (OXY IR/ROXICODONE ) 5 MG immediate release tablet Take 1 tablet (5 mg total) by mouth every 4 (four) hours as needed for moderate pain (pain score 4-6).    pantoprazole  (PROTONIX ) 40 MG tablet Take 1 tablet (40 mg total) by mouth daily.    senna (SENOKOT) 8.6 MG TABS tablet Take 1 tablet (8.6 mg total) by mouth daily as needed for mild constipation or moderate constipation.    sertraline  (ZOLOFT ) 50 MG tablet Take 1.5 tablets (75 mg total) by mouth daily. Take one and a half tablets once daily    torsemide  (DEMADEX ) 100 MG tablet Take 1 tablet (100 mg total) by mouth every Tuesday, Thursday, Saturday, and Sunday. Non-dialysis days    UNABLE TO FIND Med Name: Power Chair    [name]  500 MG chewable tablet Chew 1-2 tablets (500-1,000 mg total) by mouth See admin instructions. Take 1000mg  (2 tablets) by mouth with meals and 500mg  (1 tablet) with snacks 12/16/2023: Pt gets meds from Dr.   [DISCONTINUED] FLUoxetine (PROZAC) 10 MG capsule Take 10 mg by mouth daily.      [DISCONTINUED] glipiZIDE  (GLUCOTROL ) 10 MG tablet Take 10 mg by mouth 2 (two) times daily  before a meal.      Facility-Administered Encounter Medications as of 02/22/2024  Medication   0.9 %  sodium chloride  infusion    ALLERGIES:  Allergies  Allergen Reactions   Ace Inhibitors Anaphylaxis and Swelling   Penicillins Itching and Swelling    Has tolerated cefazolin  on multiple occasions    Statins Other (See Comments)    Elevated LFT's   Albuterol  Swelling    LABORATORY DATA:  I have reviewed the labs as listed.  CBC    Component Value Date/Time   WBC 8.1 02/05/2024 0423   RBC 3.11 (L) 02/05/2024 0423   HGB 7.8 (L) 02/05/2024 0423   HGB 10.9 (L) 12/15/2022 1340   HCT 25.7 (L) 02/05/2024 0423   HCT 37.0 12/15/2022 1340   PLT 404 (H) 02/05/2024 0423   PLT 354 12/15/2022 1340   MCV 82.6 02/05/2024 0423   MCV 81 12/15/2022 1340   MCH 25.1 (L) 02/05/2024 0423   MCHC 30.4 02/05/2024 0423   RDW 17.8 (H) 02/05/2024 0423   RDW  18.4 (H) 12/15/2022 1340   LYMPHSABS 1.2 02/01/2024 2312   LYMPHSABS 1.0 12/15/2022 1340   MONOABS 0.6 02/01/2024 2312   EOSABS 0.0 02/01/2024 2312   EOSABS 0.0 12/15/2022 1340   BASOSABS 0.0 02/01/2024 2312   BASOSABS 0.0 12/15/2022 1340      Latest Ref Rng & Units 02/05/2024    4:23 AM 02/04/2024    2:57 AM 02/03/2024    4:42 AM  CMP  Glucose 70 - 99 mg/dL 828  880  82   BUN 8 - 23 mg/dL 18  34  25   Creatinine 0.44 - 1.00 mg/dL 7.27  6.14  7.01   Sodium 135 - 145 mmol/L 133  132  136   Potassium 3.5 - 5.1 mmol/L 3.8  4.1  4.0   Chloride 98 - 111 mmol/L 95  95  96   CO2 22 - 32 mmol/L 27  22  27    Calcium  8.9 - 10.3 mg/dL 8.0  8.2  8.6     DIAGNOSTIC IMAGING:  I have independently reviewed the relevant imaging and discussed with the patient.   WRAP UP:  All questions were answered. The patient knows to call the clinic with any problems, questions or concerns.  Medical decision making: ***  Time spent on visit: I spent *** minutes counseling the patient face to face. The total time spent in the appointment was *** minutes  and more than 50% was on counseling.  Pleasant CHRISTELLA Barefoot, PA-C  ***

## 2024-02-21 NOTE — Progress Notes (Signed)
 Orders placed for labs

## 2024-02-22 ENCOUNTER — Inpatient Hospital Stay: Admitting: Physician Assistant

## 2024-02-22 ENCOUNTER — Inpatient Hospital Stay

## 2024-02-22 ENCOUNTER — Ambulatory Visit: Payer: Self-pay | Admitting: Family Medicine

## 2024-02-22 ENCOUNTER — Inpatient Hospital Stay: Admitting: Oncology

## 2024-02-22 VITALS — BP 139/65 | HR 73 | Resp 16

## 2024-02-22 DIAGNOSIS — N186 End stage renal disease: Secondary | ICD-10-CM

## 2024-02-22 DIAGNOSIS — Z992 Dependence on renal dialysis: Secondary | ICD-10-CM

## 2024-02-22 DIAGNOSIS — F322 Major depressive disorder, single episode, severe without psychotic features: Secondary | ICD-10-CM

## 2024-02-22 DIAGNOSIS — F411 Generalized anxiety disorder: Secondary | ICD-10-CM

## 2024-02-22 DIAGNOSIS — Z794 Long term (current) use of insulin: Secondary | ICD-10-CM

## 2024-02-22 DIAGNOSIS — E1169 Type 2 diabetes mellitus with other specified complication: Secondary | ICD-10-CM

## 2024-02-22 DIAGNOSIS — I151 Hypertension secondary to other renal disorders: Secondary | ICD-10-CM

## 2024-02-22 MED ORDER — BUSPIRONE HCL 7.5 MG PO TABS: 7.5000 mg | ORAL_TABLET | Freq: Three times a day (TID) | ORAL | 3 refills | Status: DC

## 2024-02-22 MED ORDER — SERTRALINE HCL 100 MG PO TABS: 100.0000 mg | ORAL_TABLET | Freq: Every day | ORAL | 3 refills | Status: DC

## 2024-02-22 NOTE — Patient Instructions (Addendum)
  F/U in 8 to 10 weeks  Verify with Orthopedics when to resume aspirin  please   Pls call for referral for in home PT twice weekly once cleared by Ortho  Dose increase to zoloft  100 mg daily  Dose inc Buspar  7.5 mg three times daily  Melatonin 5 mg one at night is fine  You are being referred for Psychotherapy  Thanks for choosing [facility], we consider it a privelige to serve you.

## 2024-02-23 ENCOUNTER — Encounter: Payer: Self-pay | Admitting: Family

## 2024-02-23 ENCOUNTER — Ambulatory Visit: Admitting: Urology

## 2024-02-23 NOTE — Progress Notes (Unsigned)
 Kathryn Beck     MRN: [id number]      DOB: [birth year]  Chief Complaint  Patient presents with   Diabetes    Follow up. Questions about aspirin  and oxycodone . Needing letter renewal for window tint    HPI Kathryn Beck is here for follow up and re-evaluation of chronic medical conditions, medication management and review of any available recent lab and radiology data.  Hospitalized 11/11 to 02/06/2024 due to left hand gangrene , had left forearm amputation and left upper arm AV fistula ligation on 02/03/2024. Doing well post op has next Ortho f/u in next 1 week, aspirin  to remain on hold until Ortho decides  Pt mentally and emotionally overwhelmed with health challenges, states she cries often and often feels that her family is tired of her Asking about kidney transplant states this seems to be overlooked, everyone saying she is doing well but she does not feel that way Wants to get PT to be able to walk, has to get Ortho clearance re ability to use upper extremities to balance on walker  ROS Denies recent fever or chills. Denies sinus pressure, nasal congestion, ear pain or sore throat. Denies chest congestion, productive cough or wheezing. Denies chest pains, palpitations and leg swelling Denies abdominal pain, nausea, vomiting,diarrhea or constipation.   Passes no urine  C/o uncontrolled depression and  anxiety and  insomnia.Not suicidal or homicidal Wound is healing very well PE  BP 139/65   Pulse 73   Resp 16   SpO2 97%   Patient alert and oriented and in no cardiopulmonary distress.  HEENT: No facial asymmetry, EOMI,     Neck decreased ROM  Chest: Clear to auscultation bilaterally.  CVS: S1, S2 no murmurs, no S3.Regular rate.  ABD: Soft non tender.   Ext: No edema  MS: decreased  ROM spine, shoulders, hips and knees.  Skin: surgical wound of left forearm bandaged , reportedly  Psych: Good eye contact, normal affect. Memory intact  anxious and  depressed  appearing.  CNS: CN 2-12 intact, power,  normal throughout.no focal deficits noted.   Assessment & Plan  Depression, major, single episode, severe (HCC) Increase zoloft  to 100 mg daily and refer to therapy  GAD (generalized anxiety disorder) Uncontrolled increase dose buspar  and refer to therapy  HTN (hypertension) Controlled, no change in medication   Type 2 diabetes mellitus with other specified complication (HCC) Diabetes associated with hypertension, CKD, arthritis, and depression  Kathryn Beck is reminded of the importance of commitment to daily physical activity for 30 minutes or more, as able and the need to limit carbohydrate intake to 30 to 60 grams per meal to help with blood sugar control.   The need to take medication as prescribed, test blood sugar as directed, and to call between visits if there is a concern that blood sugar is uncontrolled is also discussed.   Kathryn Beck is reminded of the importance ofannual eye examination, and good blood sugar, blood pressure and cholesterol control.     Latest Ref Rng & Units 02/05/2024    4:23 AM 02/04/2024    2:57 AM 02/03/2024    4:42 AM 02/02/2024    3:02 PM 02/02/2024    4:44 AM  Diabetic Labs  Creatinine 0.44 - 1.00 mg/dL 7.27  6.14  7.01  4.96  4.51       02/22/2024   10:59 AM 02/06/2024   10:16 AM 02/06/2024    3:29 AM 02/05/2024    7:32  PM 02/05/2024    3:04 PM 02/05/2024    7:57 AM 02/05/2024    5:00 AM  BP/Weight  Systolic BP 139 165 158 128 148 159   Diastolic BP 65 76 75 64 74 81   Wt. (Lbs)       141.54  BMI       22.84 kg/m2      Latest Ref Rng & Units 04/16/2022   11:20 AM 07/24/2021    3:42 PM  Foot/eye exam completion dates  Eye Exam No Retinopathy  No Retinopathy      Foot Form Completion  Done      This result is from an external source.      Controlled, no change in medication Managed by Endo Updated lab needed at/ before next visit.   ESRD on hemodialysis [facility]) Questions about  kidney transplant, I recommend that based on current co morbidities , and medical history, continuing with dialysis is the better and likely her only option now, I ewncouraged her to d/w her nephrologist and Endo doc as well

## 2024-02-24 ENCOUNTER — Encounter: Admitting: Orthopedic Surgery

## 2024-02-25 ENCOUNTER — Telehealth: Payer: Self-pay

## 2024-02-25 NOTE — Telephone Encounter (Signed)
 Pt daughter informed that pt is scheduled for evaluation with company on 12/30

## 2024-02-25 NOTE — Telephone Encounter (Signed)
 Copied from CRM [id number]. Topic: Clinical - Order For Equipment >> Feb 24, 2024  4:32 PM Kathryn Beck wrote: Reason for CRM: Patient called in to check status of electric scooter, I adv patient of the following info: The order was sent to [facility] and [facility] but was cancelled due to her being admitted in a facility. They said when/if she is no longer in facility to call them back at [phone] and let them know to open the work order back up but that if she still had skilled facility as her primary coverage and in a facility, they could not process the order.  Patient stated she has not been in a facility in over a year, patient also stated that she has no way to get around and needs this equipment ASAP.

## 2024-02-25 NOTE — Progress Notes (Signed)
 Complex Care Management Note  Care Guide Note 02/25/2024 Name: Kathryn Beck MRN: [id number] DOB: 01-Dec-1955  Kathryn Beck is a 68 y.o. year old female who sees Antonetta Rollene BRAVO, MD for primary care. I reached out to [name] by phone today to offer complex care management services.  Ms. Coonan was given information about Complex Care Management services today including:   The Complex Care Management services include support from the care team which includes your Nurse Care Manager, Clinical Social Worker, or Pharmacist.  The Complex Care Management team is here to help remove barriers to the health concerns and goals most important to you. Complex Care Management services are voluntary, and the patient may decline or stop services at any time by request to their care team member.   Complex Care Management Consent Status: Patient did not agree to participate in complex care management services at this time.  Follow up plan:   Encounter Outcome:  Patient Refused  Jeoffrey Buffalo , RMA     [place], [facility] Guide  Direct Dial : [phone]  Website: [email or web address]

## 2024-02-28 NOTE — Assessment & Plan Note (Signed)
 Increase zoloft  to 100 mg daily and refer to therapy

## 2024-02-28 NOTE — Assessment & Plan Note (Signed)
 Uncontrolled increase dose buspar  and refer to therapy

## 2024-02-28 NOTE — Assessment & Plan Note (Signed)
 Questions about kidney transplant, I recommend that based on current co morbidities , and medical history, continuing with dialysis is the better and likely her only option now, I ewncouraged her to d/w her nephrologist and Endo doc as well

## 2024-02-28 NOTE — Assessment & Plan Note (Addendum)
 Diabetes associated with hypertension, CKD, arthritis, and depression  Kathryn Beck is reminded of the importance of commitment to daily physical activity for 30 minutes or more, as able and the need to limit carbohydrate intake to 30 to 60 grams per meal to help with blood sugar control.   The need to take medication as prescribed, test blood sugar as directed, and to call between visits if there is a concern that blood sugar is uncontrolled is also discussed.   Kathryn Beck is reminded of the importance ofannual eye examination, and good blood sugar, blood pressure and cholesterol control.     Latest Ref Rng & Units 02/05/2024    4:23 AM 02/04/2024    2:57 AM 02/03/2024    4:42 AM 02/02/2024    3:02 PM 02/02/2024    4:44 AM  Diabetic Labs  Creatinine 0.44 - 1.00 mg/dL 7.27  6.14  7.01  4.96  4.51       02/22/2024   10:59 AM 02/06/2024   10:16 AM 02/06/2024    3:29 AM 02/05/2024    7:32 PM 02/05/2024    3:04 PM 02/05/2024    7:57 AM 02/05/2024    5:00 AM  BP/Weight  Systolic BP 139 165 158 128 148 159   Diastolic BP 65 76 75 64 74 81   Wt. (Lbs)       141.54  BMI       22.84 kg/m2      Latest Ref Rng & Units 04/16/2022   11:20 AM 07/24/2021    3:42 PM  Foot/eye exam completion dates  Eye Exam No Retinopathy  No Retinopathy      Foot Form Completion  Done      This result is from an external source.      Controlled, no change in medication Managed by Endo Updated lab needed at/ before next visit.

## 2024-02-28 NOTE — Assessment & Plan Note (Signed)
 Controlled, no change in medication

## 2024-02-29 ENCOUNTER — Encounter: Admitting: Orthopedic Surgery

## 2024-03-01 ENCOUNTER — Other Ambulatory Visit: Payer: Self-pay | Admitting: Family Medicine

## 2024-03-02 ENCOUNTER — Ambulatory Visit

## 2024-03-02 ENCOUNTER — Ambulatory Visit (INDEPENDENT_AMBULATORY_CARE_PROVIDER_SITE_OTHER): Admitting: Otolaryngology

## 2024-03-02 MED ORDER — INCLISIRAN SODIUM 284 MG/1.5ML ~~LOC~~ SOSY: 284.0000 mg | PREFILLED_SYRINGE | Freq: Once | SUBCUTANEOUS | Status: AC

## 2024-03-02 NOTE — Addendum Note (Signed)
 Addended by: ANTONETTA QUANT E on: 03/02/2024 11:08 AM   Modules accepted: Orders

## 2024-03-03 ENCOUNTER — Telehealth: Payer: Self-pay | Admitting: Family Medicine

## 2024-03-03 MED ORDER — PREGABALIN 50 MG PO CAPS: 50.0000 mg | ORAL_CAPSULE | Freq: Three times a day (TID) | ORAL | 3 refills | Status: DC

## 2024-03-03 NOTE — Telephone Encounter (Signed)
 Lyrica  is prescribed and at [facility]

## 2024-03-03 NOTE — Telephone Encounter (Signed)
 Tried calling, n/a, vm full

## 2024-03-03 NOTE — Telephone Encounter (Unsigned)
 Copied from CRM #[id number]. Topic: Clinical - Medication Refill >> Mar 03, 2024  8:48 AM Treva T wrote: Medication: oxyCODONE  (Oxy IR/ROXICODONE ) immediate release tablet 5 mg   Has the patient contacted their pharmacy? Yes, unable to reach pharmacy per pt   This is the patient's preferred pharmacy:  [facility] - [place], [place] - [address] [place] [address] Phone: [phone] Fax: [phone]    Is this the correct pharmacy for this prescription? Yes If no, delete pharmacy and type the correct one.   Has the prescription been filled recently? Yes  Is the patient out of the medication? Yes  Has the patient been seen for an appointment in the last year OR does the patient have an upcoming appointment? Yes  Can we respond through MyChart? Yes  Agent: Please be advised that Rx refills may take up to 3 business days. We ask that you follow-up with your pharmacy.

## 2024-03-03 NOTE — Telephone Encounter (Signed)
 Copied from CRM #[id number]. Topic: General - Other >> Mar 02, 2024  4:59 PM Victoria B wrote: Reason for CRM: Patient called in about refill of med for pain that starts with an L. Spoke with pharmacy and they mentioned pregablin, so just need to confirm what pain med. Please cb

## 2024-03-03 NOTE — Addendum Note (Signed)
 Addended by: ANTONETTA ROLLENE BRAVO on: 03/03/2024 09:54 AM   Modules accepted: Orders

## 2024-03-06 ENCOUNTER — Encounter: Payer: Self-pay | Admitting: Family Medicine

## 2024-03-06 MED ORDER — MIRTAZAPINE 7.5 MG PO TABS: 7.5000 mg | ORAL_TABLET | Freq: Every day | ORAL | 3 refills | Status: DC

## 2024-03-07 ENCOUNTER — Encounter: Admitting: Vascular Surgery

## 2024-03-07 ENCOUNTER — Encounter: Admitting: Orthopedic Surgery

## 2024-03-07 ENCOUNTER — Ambulatory Visit

## 2024-03-07 ENCOUNTER — Other Ambulatory Visit

## 2024-03-07 MED ORDER — INCLISIRAN SODIUM 284 MG/1.5ML ~~LOC~~ SOSY: 284.0000 mg | PREFILLED_SYRINGE | Freq: Once | SUBCUTANEOUS | Status: AC

## 2024-03-07 NOTE — Progress Notes (Deleted)
° °  Kathryn Beck - 68 y.o. female MRN [id number]  Date of birth: 1955-03-24  Office Visit Note: Visit Date: 03/07/2024 PCP: Antonetta Rollene BRAVO, MD Referred by: Antonetta Rollene BRAVO, MD  Subjective:  HPI: Kathryn Beck is a 68 y.o. female who presents today for follow up 4 weeks status post left hand irrigation and debridement. Left forearm amputation.  Pertinent ROS were reviewed with the patient and found to be negative unless otherwise specified above in HPI.   Assessment & Plan: Visit Diagnoses: No diagnosis found.  Plan: ***  Follow-up: No follow-ups on file.   Meds & Orders: No orders of the defined types were placed in this encounter.  No orders of the defined types were placed in this encounter.    Procedures: No procedures performed       Objective:   Vital Signs: There were no vitals taken for this visit.  Ortho Exam ***  Imaging: No results found.   Anshul Afton Alderton, M.D. [facility], Hand Surgery

## 2024-03-09 ENCOUNTER — Ambulatory Visit: Admitting: Gastroenterology

## 2024-03-14 ENCOUNTER — Ambulatory Visit

## 2024-03-14 ENCOUNTER — Ambulatory Visit (INDEPENDENT_AMBULATORY_CARE_PROVIDER_SITE_OTHER): Admitting: Otolaryngology

## 2024-03-14 ENCOUNTER — Ambulatory Visit (INDEPENDENT_AMBULATORY_CARE_PROVIDER_SITE_OTHER)

## 2024-03-14 VITALS — BP 165/81 | HR 77 | Temp 98.4°F | Resp 18

## 2024-03-14 DIAGNOSIS — S58912S Complete traumatic amputation of left forearm, level unspecified, sequela: Secondary | ICD-10-CM

## 2024-03-14 DIAGNOSIS — E785 Hyperlipidemia, unspecified: Secondary | ICD-10-CM

## 2024-03-14 MED ORDER — INCLISIRAN SODIUM 284 MG/1.5ML ~~LOC~~ SOSY
284.0000 mg | PREFILLED_SYRINGE | Freq: Once | SUBCUTANEOUS | Status: AC
  Administered 2024-03-14: 284 mg via SUBCUTANEOUS
  Filled 2024-03-14: qty 1.5

## 2024-03-14 NOTE — Progress Notes (Signed)
 Diagnosis: Hyperlipidemia  Provider:  Chilton Greathouse MD  Procedure: Injection  Leqvio (inclisiran), Dose: 284 mg, Site: subcutaneous, Number of injections: 1  Injection Site(s): Right arm  Post Care: Patient declined observation  Discharge: Condition: Good, Destination: Home . AVS Declined  Performed by:  Loney Hering, LPN

## 2024-03-14 NOTE — Progress Notes (Signed)
 Kathryn Beck came in today to have her stitches removed from her left hand amputation surgery on 02/03/24. I was able to remove all stitches from her incision. There were no signs of swelling, redness, or discharge. The pt had no questions or concerns. I told the pt and her son who accompanied her to call the office if they had any questions that arise.

## 2024-03-15 ENCOUNTER — Other Ambulatory Visit: Payer: Self-pay

## 2024-03-15 ENCOUNTER — Encounter: Payer: Self-pay | Admitting: Family

## 2024-03-15 ENCOUNTER — Emergency Department (HOSPITAL_COMMUNITY)
Admission: EM | Admit: 2024-03-15 | Discharge: 2024-03-15 | Disposition: A | Discharge status: Home / Self Care | Source: Other Acute Inpatient Hospital | Attending: Emergency Medicine | Admitting: Emergency Medicine

## 2024-03-15 ENCOUNTER — Encounter (HOSPITAL_COMMUNITY): Payer: Self-pay

## 2024-03-15 ENCOUNTER — Emergency Department (HOSPITAL_COMMUNITY)

## 2024-03-15 DIAGNOSIS — T8242XA Displacement of vascular dialysis catheter, initial encounter: Secondary | ICD-10-CM | POA: Insufficient documentation

## 2024-03-15 DIAGNOSIS — N186 End stage renal disease: Secondary | ICD-10-CM

## 2024-03-15 DIAGNOSIS — T829XXA Unspecified complication of cardiac and vascular prosthetic device, implant and graft, initial encounter: Secondary | ICD-10-CM

## 2024-03-15 DIAGNOSIS — Y732 Prosthetic and other implants, materials and accessory gastroenterology and urology devices associated with adverse incidents: Secondary | ICD-10-CM | POA: Diagnosis not present

## 2024-03-15 LAB — I-STAT CHEM 8, ED
BUN: 30 mg/dL — ABNORMAL HIGH (ref 8–23)
Calcium, Ion: 1.1 mmol/L — ABNORMAL LOW (ref 1.15–1.40)
Chloride: 104 mmol/L (ref 98–111)
Creatinine, Ser: 4.6 mg/dL — ABNORMAL HIGH (ref 0.44–1.00)
Glucose, Bld: 183 mg/dL — ABNORMAL HIGH (ref 70–99)
HCT: 28 % — ABNORMAL LOW (ref 36.0–46.0)
Hemoglobin: 9.5 g/dL — ABNORMAL LOW (ref 12.0–15.0)
Potassium: 3.4 mmol/L — ABNORMAL LOW (ref 3.5–5.1)
Sodium: 144 mmol/L (ref 135–145)
TCO2: 24 mmol/L (ref 22–32)

## 2024-03-15 NOTE — ED Notes (Signed)
 Attempted to call to notify of appointment. Dialled phone number on file multiple times, unable to get through. Called second emergency contact, voicemail full.

## 2024-03-15 NOTE — Discharge Instructions (Addendum)
 You should arrive at [facility] at 7:00 AM on Friday for replacement of your dialysis catheter.

## 2024-03-15 NOTE — ED Triage Notes (Signed)
 Pt via [facility] from dialysis for pt pulling on port access and dialysis no longer being able to use it. No bleeding and does not appear to be pullover all the way out. Pt got 2 hours of dialysis today.

## 2024-03-15 NOTE — Progress Notes (Signed)
 Pt receives out-pt HD at [facility], MWF 0700 chair time. Clinic informed of pt arrival. Will assist as needed.    [facility] Manoj Enriquez Dialysis Navigator [phone]

## 2024-03-15 NOTE — ED Provider Notes (Signed)
 " [place] EMERGENCY DEPARTMENT AT [facility] Provider Note   CSN: [id number] Arrival date & time: 03/15/24  9092     Patient presents with: No chief complaint on file.   Kathryn Beck is a 68 y.o. female.   HPI Elderly female arrives via EMS from dialysis.  Patient has no complaints.  Per EMS patient had an episode of either sleepiness, or near syncope, during which she dislodged her dialysis catheter.  Poorly she was subsequently mildly hypotensive, but EMS reports in transport patient was normotensive, without oxygen  requirement, was awake and alert. Patient denies pain.    Prior to Admission medications  Medication Sig Start Date End Date Taking? Authorizing Provider  acetaminophen  (TYLENOL ) 500 MG tablet Take 1,000 mg by mouth every 6 (six) hours as needed for mild pain (pain score 1-3).    [provider]  amLODipine  (NORVASC ) 10 MG tablet Take 0.5 tablets (5 mg total) by mouth daily. 10/28/23   Pearlean Manus, MD  [Paused] ASPIRIN  LOW DOSE 81 MG chewable tablet Chew 162 mg by mouth daily. Wait to take this until your doctor or other care provider tells you to start again. 01/05/24   [provider]  B Complex-C-Folic Acid  (RENA-VITE RX) 1 MG TABS Take 1 tablet by mouth daily. 04/15/22   [provider]  busPIRone  (BUSPAR ) 7.5 MG tablet Take 1 tablet (7.5 mg total) by mouth 3 (three) times daily. 02/22/24   Antonetta Rollene BRAVO, MD  calcitRIOL  (ROCALTROL ) 0.5 MCG capsule Take 2 capsules (1 mcg total) by mouth every Monday, Wednesday, and Friday with hemodialysis. 11/05/23   Caleen Burgess BROCKS, MD  cinacalcet  (SENSIPAR ) 30 MG tablet Take 3 tablets (90 mg total) by mouth every Monday, Wednesday, and Friday. 12/08/23   Johnson, Clanford L, MD  ezetimibe  (ZETIA ) 10 MG tablet Take 1 tablet (10 mg total) by mouth daily. 04/27/23   Antonetta Rollene BRAVO, MD  insulin  aspart (NOVOLOG ) 100 UNIT/ML injection Inject 0-6 Units into the skin 3 (three) times daily with  meals. CBG 70 - 120: 0 units  CBG 121 - 150: 0 units  CBG 151 - 200: 1 unit  CBG 201-250: 2 units  CBG 251-300: 3 units  CBG 301-350: 4 units  CBG 351-400: 5 units  CBG > 400: Give 10 units and call MD 11/12/23   Shahmehdi, Seyed A, MD  insulin  glargine, 2 Unit Dial , (TOUJEO  MAX SOLOSTAR) 300 UNIT/ML Solostar Pen Inject 30 Units into the skin daily. May need to slowly increase the dose depending upon your blood sugar, follow-up with PCP 01/01/23   Trixie File, MD  metoprolol  succinate (TOPROL -XL) 50 MG 24 hr tablet TAKE ONE TABLET BY MOUTH ONCE DAILY WITH FOOD (TAKE AFTER DIALYSIS) 12/20/23   Antonetta Rollene BRAVO, MD  mirtazapine  (REMERON ) 7.5 MG tablet Take 1 tablet (7.5 mg total) by mouth at bedtime. 03/06/24   Antonetta Rollene BRAVO, MD  nitroGLYCERIN  (NITRODUR - DOSED IN MG/24 HR) 0.2 mg/hr patch Place 1 patch (0.2 mg total) onto the skin daily. Patient not taking: Reported on 02/22/2024 12/28/23   Arlinda Buster, MD  ondansetron  (ZOFRAN ) 4 MG tablet Take 4 mg by mouth every 8 (eight) hours as needed for nausea or vomiting. 11/26/23   [provider]  pantoprazole  (PROTONIX ) 40 MG tablet TAKE ONE TABLET BY MOUTH EVERY DAY 03/01/24   Antonetta Rollene BRAVO, MD  pregabalin  (LYRICA ) 50 MG capsule Take 1 capsule (50 mg total) by mouth 3 (three) times daily. 03/03/24  Antonetta Rollene BRAVO, MD  senna (SENOKOT) 8.6 MG TABS tablet Take 1 tablet (8.6 mg total) by mouth daily as needed for mild constipation or moderate constipation. 02/06/24   Arlice Reichert, MD  sertraline  (ZOLOFT ) 100 MG tablet Take 1 tablet (100 mg total) by mouth daily. 02/22/24   Antonetta Rollene BRAVO, MD  UNABLE TO FIND Med Name: Power Chair 02/09/24   Antonetta Rollene BRAVO, MD  [name]  500 MG chewable tablet Chew 1-2 tablets (500-1,000 mg total) by mouth See admin instructions. Take 1000mg  (2 tablets) by mouth with meals and 500mg  (1 tablet) with snacks 08/23/23   Antonetta Rollene BRAVO, MD  FLUoxetine (PROZAC) 10 MG capsule Take 10  mg by mouth daily.    05/28/11  [provider]  glipiZIDE  (GLUCOTROL ) 10 MG tablet Take 10 mg by mouth 2 (two) times daily before a meal.    05/28/11  [provider]    Allergies: Ace inhibitors, Penicillins, Statins, and Albuterol     Review of Systems  Updated Vital Signs BP (!) 163/79   Pulse 67   Temp 98.2 F (36.8 C)   Resp 18   SpO2 97%   Physical Exam Vitals and nursing note reviewed.  Constitutional:      General: She is not in acute distress.    Appearance: She is well-developed.  HENT:     Head: Normocephalic and atraumatic.  Eyes:     Conjunctiva/sclera: Conjunctivae normal.  Cardiovascular:     Rate and Rhythm: Normal rate and regular rhythm.  Pulmonary:     Effort: Pulmonary effort is normal. No respiratory distress.     Breath sounds: Normal breath sounds. No stridor.  Chest:    Abdominal:     General: There is no distension.  Musculoskeletal:       Arms:  Skin:    General: Skin is warm and dry.  Neurological:     Mental Status: She is alert and oriented to person, place, and time.     Cranial Nerves: No cranial nerve deficit.  Psychiatric:        Mood and Affect: Mood normal.     (all labs ordered are listed, but only abnormal results are displayed) Labs Reviewed  I-STAT CHEM 8, ED - Abnormal; Notable for the following components:      Result Value   Potassium 3.4 (*)    BUN 30 (*)    Creatinine, Ser 4.60 (*)    Glucose, Bld 183 (*)    Calcium , Ion 1.10 (*)    Hemoglobin 9.5 (*)    HCT 28.0 (*)    All other components within normal limits    EKG: None  Radiology: DG Chest 2 View Result Date: 03/15/2024 CLINICAL DATA:  Malfunctioning dialysis port EXAM: CHEST - 2 VIEW COMPARISON:  December 06, 2023 FINDINGS: Mild cardiomegaly. Right internal jugular dialysis catheter appears to have been significantly retracted with distal tip in soft tissues common not in vein. Mild central pulmonary vascular congestion is noted.  Minimal left basilar subsegmental atelectasis is noted with small left pleural effusion. Bony thorax is unremarkable. IMPRESSION: Right internal jugular dialysis catheter appears to have been significantly retracted with distal tip in soft tissues, not in vein. Mild cardiomegaly with mild central pulmonary vascular congestion. Electronically Signed   By: Lynwood Landy Raddle M.D.   On: 03/15/2024 10:37     Procedures   Medications Ordered in the ED - No data to display  Medical Decision Making Arrives from dialysis after potentially dislodging her dialysis catheter.  Patient initial vitals are reassuring, concern for dislodged catheter and less likely infection, introduction of air into the thoracic cavity, though these are considerations. Initial vitals reassuring, pulse ox 98% room air   Amount and/or Complexity of Data Reviewed Independent Historian: EMS External Data Reviewed: notes.    Details: Yesterday's visit note:  Shakara came in today to have her stitches removed from her left hand amputation surgery on 02/03/24. I was able to remove all stitches from her incision. There were no signs of swelling, redness, or discharge. The pt had no questions or concerns. I told the pt and her son who accompanied her to call the office if they had any questions that arise.   Labs: ordered. Decision-making details documented in ED Course. Radiology: ordered and independent interpretation performed. Decision-making details documented in ED Course.  Risk Decision regarding hospitalization. Diagnosis or treatment significantly limited by social determinants of health.   Patient accompanied by her husband.  We discussed lab results, x-ray, essentially the dialysis catheter has been entirely dislodged, tip barely subcutaneous.  I discussed the patient's case with our vascular surgeon, Dr. Serene who will graciously assist with scheduling for replacement of catheter in 2  days at [facility], today, advises for complete removal of catheter, pressure application, discharged home, as above to return in 2 [facility] to [facility] for new catheter, and likely dialysis.  Catheter removed with assistance of nurses.  Vitals reassuring, labs reassuring, patient discharged in stable condition.     Final diagnoses:  Complication of vascular access for dialysis, initial encounter    ED Discharge Orders     None          Garrick Charleston, MD 03/15/24 1142  "

## 2024-03-17 ENCOUNTER — Other Ambulatory Visit: Payer: Self-pay

## 2024-03-17 ENCOUNTER — Ambulatory Visit (HOSPITAL_COMMUNITY)
Admission: RE | Admit: 2024-03-17 | Discharge: 2024-03-17 | Disposition: A | Discharge status: Home / Self Care | Attending: Vascular Surgery | Admitting: Vascular Surgery

## 2024-03-17 ENCOUNTER — Encounter (HOSPITAL_COMMUNITY): Admission: RE | Disposition: A | Payer: Self-pay | Source: Home / Self Care | Attending: Vascular Surgery

## 2024-03-17 ENCOUNTER — Institutional Professional Consult (permissible substitution): Payer: Self-pay | Admitting: Professional Counselor

## 2024-03-17 DIAGNOSIS — N186 End stage renal disease: Secondary | ICD-10-CM | POA: Diagnosis not present

## 2024-03-17 DIAGNOSIS — Z992 Dependence on renal dialysis: Secondary | ICD-10-CM | POA: Diagnosis not present

## 2024-03-17 DIAGNOSIS — I132 Hypertensive heart and chronic kidney disease with heart failure and with stage 5 chronic kidney disease, or end stage renal disease: Secondary | ICD-10-CM | POA: Diagnosis not present

## 2024-03-17 DIAGNOSIS — Z89511 Acquired absence of right leg below knee: Secondary | ICD-10-CM | POA: Diagnosis not present

## 2024-03-17 DIAGNOSIS — Z89512 Acquired absence of left leg below knee: Secondary | ICD-10-CM | POA: Diagnosis not present

## 2024-03-17 HISTORY — PX: DIALYSIS/PERMA CATHETER INSERTION: CATH118288

## 2024-03-17 LAB — GLUCOSE, CAPILLARY
Glucose-Capillary: 181 mg/dL — ABNORMAL HIGH (ref 70–99)
Glucose-Capillary: 149 mg/dL — ABNORMAL HIGH (ref 70–99)

## 2024-03-17 LAB — POCT I-STAT, CHEM 8
BUN: 62 mg/dL — ABNORMAL HIGH (ref 8–23)
Calcium, Ion: 1.17 mmol/L (ref 1.15–1.40)
Chloride: 108 mmol/L (ref 98–111)
Creatinine, Ser: 7.3 mg/dL — ABNORMAL HIGH (ref 0.44–1.00)
Glucose, Bld: 180 mg/dL — ABNORMAL HIGH (ref 70–99)
HCT: 26 % — ABNORMAL LOW (ref 36.0–46.0)
Hemoglobin: 8.8 g/dL — ABNORMAL LOW (ref 12.0–15.0)
Potassium: 4.1 mmol/L (ref 3.5–5.1)
Sodium: 145 mmol/L (ref 135–145)
TCO2: 24 mmol/L (ref 22–32)

## 2024-03-17 SURGERY — DIALYSIS/PERMA CATHETER INSERTION
Anesthesia: LOCAL

## 2024-03-17 MED ORDER — MIDAZOLAM HCL (PF) 2 MG/2ML IJ SOLN: INTRAMUSCULAR | Status: DC | PRN

## 2024-03-17 MED ORDER — HEPARIN (PORCINE) IN NACL 1000-0.9 UT/500ML-% IV SOLN
INTRAVENOUS | Status: DC | PRN
  Administered 2024-03-17: 500 mL

## 2024-03-17 MED ORDER — LIDOCAINE HCL (PF) 1 % IJ SOLN
INTRAMUSCULAR | Status: AC
  Filled 2024-03-17: qty 30

## 2024-03-17 MED ORDER — MIDAZOLAM HCL 2 MG/2ML IJ SOLN
INTRAMUSCULAR | Status: AC
  Filled 2024-03-17: qty 2

## 2024-03-17 MED ORDER — ACETAMINOPHEN 500 MG PO TABS
1000.0000 mg | ORAL_TABLET | Freq: Four times a day (QID) | ORAL | Status: DC | PRN
  Administered 2024-03-17: 1000 mg via ORAL
  Filled 2024-03-17: qty 2

## 2024-03-17 MED ORDER — FENTANYL CITRATE (PF) 100 MCG/2ML IJ SOLN: INTRAMUSCULAR | Status: DC | PRN

## 2024-03-17 MED ORDER — FENTANYL CITRATE (PF) 100 MCG/2ML IJ SOLN
INTRAMUSCULAR | Status: AC
  Filled 2024-03-17: qty 2

## 2024-03-17 MED ORDER — SODIUM CHLORIDE 0.9% FLUSH: 3.0000 mL | INTRAVENOUS | Status: DC | PRN

## 2024-03-17 MED ORDER — HEPARIN SODIUM (PORCINE) 1000 UNIT/ML IJ SOLN
INTRAMUSCULAR | Status: AC
  Filled 2024-03-17: qty 10

## 2024-03-17 MED ORDER — LIDOCAINE HCL (PF) 1 % IJ SOLN
INTRAMUSCULAR | Status: DC | PRN
  Administered 2024-03-17: 20 mL via INTRADERMAL

## 2024-03-17 SURGICAL SUPPLY — 5 items
CATH PALINDROME-P 19CM W/VT (CATHETERS) IMPLANT
GLIDEWIRE ANGLED SS 035X260CM (WIRE) IMPLANT
KIT MICROPUNCTURE NIT STIFF (SHEATH) IMPLANT
PACK CARDIAC CATHETERIZATION (CUSTOM PROCEDURE TRAY) IMPLANT
SHEATH PROBE COVER 6X72 (BAG) IMPLANT

## 2024-03-17 NOTE — Progress Notes (Signed)
 Discharge instructions reviewed with patient and daughter at bedside. Denies questions concerns. PT tolerated PO intake. Incision site remains clean dry and intact. No s/s of complications. PT escorted from the unit via wheel chair to personal vehicle.

## 2024-03-17 NOTE — Discharge Instructions (Signed)
 [name]

## 2024-03-17 NOTE — H&P (Signed)
 [facility] [place]  ASSESSMENT / PLAN: 68 y.o. female with no permanent dialysis access. TDC removed inadvertently by patient. Plan TDC placement today in cath lab  CHIEF COMPLAINT: ESRD  HISTORY OF PRESENT ILLNESS: Kathryn Beck is a 68 y.o. female with ESRD on HD via TDC. She inadvertently removed her [facility] about two days ago. He last dialysis was about that same time. No signs / symptoms of TDC infection.   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  Last Assessment & Plan:   Multiple polyps with surveillance due in 2021.  IMO SNOMED Dx Update Oct 2024     History of pneumonia 2019   Hypoxia 04/03/2019   Memory loss    Mixed hyperlipidemia    Not currently working due to disabled status 06/13/2018   Formatting of this note might be different from the original.  Last Assessment & Plan:   Formatting of this note might be different from the original.  Reports being  disabled since April/May 2019 , hospitalized then in a niursing home, now on dialysis and is unable to work     Obesity    Pancreatitis    Peritoneal dialysis catheter in place    Pneumonia due  to COVID-19 virus 04/02/2019   Sleep apnea    Noncompliant with CPAP   Stroke (HCC)    mini stroke   Type 2 diabetes mellitus (HCC)     Past Surgical History:  Procedure Laterality Date   ABDOMINAL AORTOGRAM W/LOWER EXTREMITY N/A 04/30/2022   Procedure: ABDOMINAL AORTOGRAM W/LOWER EXTREMITY;  Surgeon: Magda Debby SAILOR, MD;  [facility];  Service: Cardiovascular;  Laterality: N/A;   ABDOMINAL AORTOGRAM W/LOWER EXTREMITY N/A 07/21/2022   Procedure: ABDOMINAL AORTOGRAM W/LOWER EXTREMITY;  Surgeon: Serene Gaile ORN, MD;  [facility]: [facility];  Service: Cardiovascular;  Laterality: N/A;   ABDOMINAL HYSTERECTOMY     ACHILLES TENDON LENGTHENING  08/15/2022   Procedure: ACHILLES TENDON LENGTHENING;  Surgeon: Silva Juliene SAUNDERS, DPM;  Location: [facility] OR;  Service: Podiatry;;   AMPUTATION Right 05/29/2022   Procedure: RIGHT BELOW THE KNEE AMPUTATION;  Surgeon: Harden Jerona GAILS, MD;  Location: [facility] OR;  Service: Orthopedics;  Laterality: Right;   AMPUTATION Left 09/04/2022   Procedure: AMPUTATION FOOT, serial irrigation;  Surgeon: Joya Stabs, DPM;  Location: [facility] OR;  Service: Podiatry;  Laterality: Left;  Surgical team to do block   AMPUTATION Left 10/07/2022   Procedure: LEFT BELOW KNEE AMPUTATION;  Surgeon: Harden Jerona GAILS, MD;  [facility]: [facility] OR;  Service: Orthopedics;  Laterality: Left;   AMPUTATION Left 02/03/2024   Procedure: AMPUTATION, HAND;  Surgeon: Arlinda Buster, MD;  [facility] OR;  Service: Orthopedics;  Laterality: Left;  LEFT HAND AMPUTATION   AMPUTATION FINGER Left 09/29/2023   Procedure: AMPUTATION, FINGER;  Surgeon: Harden Jerona GAILS, MD;  Location: [facility] OR;  Service: Orthopedics;  Laterality: Left;  LEFT HAND LONG FINGER RAY AMPUTATION   AV FISTULA PLACEMENT Left 09/02/2017   Procedure: creation of left arm ARTERIOVENOUS (AV) FISTULA;  Surgeon: Serene Gaile ORN, MD;  Location: [facility];  Service: Vascular;  Laterality: Left;   COLONOSCOPY  2008   Dr. Harvey: normal     COLONOSCOPY N/A 12/18/2016   Dr. Harvey: multiple tubular adenomas, internal hemorrhoids. Surveillance in 3 years    COLONOSCOPY N/A 12/05/2023   Procedure: COLONOSCOPY;  Surgeon: Cindie Carlin POUR, DO;  [facility];  Service: Endoscopy;  Laterality: N/A;   COLONOSCOPY N/A 12/17/2023   Procedure: COLONOSCOPY;  Surgeon: Shaaron Lamar HERO, MD;  [facility];  Service: Endoscopy;  Laterality: N/A;   COLONOSCOPY N/A 01/01/2024   Procedure: COLONOSCOPY;  Surgeon: Cindie Carlin POUR, DO;  [facility];  Service: Endoscopy;  Laterality: N/A;   ESOPHAGEAL DILATION N/A 10/13/2015   Procedure: ESOPHAGEAL DILATION;  Surgeon: Claudis RAYMOND Rivet, MD;  [facility];  Service: Endoscopy;  Laterality: N/A;   ESOPHAGEAL DILATION N/A 12/03/2023   Procedure: DILATION, ESOPHAGUS;  Surgeon: Cindie Carlin POUR, DO;  [facility];  Service: Endoscopy;  Laterality: N/A;   ESOPHAGOGASTRODUODENOSCOPY N/A 10/13/2015   Dr. Rivet: chronic gastritis on path, no H.pylori. Empiric dilation    ESOPHAGOGASTRODUODENOSCOPY N/A 12/18/2016   Dr. Harvey: mild gastritis. BRAVO study revealed uncontrolled GERD. Dysphagia secondary to uncontrolled reflux   ESOPHAGOGASTRODUODENOSCOPY N/A 12/03/2023   Procedure: EGD (ESOPHAGOGASTRODUODENOSCOPY);  Surgeon: Cindie Carlin POUR, DO;  [facility];  Service: Endoscopy;  Laterality: N/A;   FOOT SURGERY Bilateral    nerve     INCISION AND DRAINAGE OF WOUND Left 10/30/2023   Procedure: IRRIGATION AND DEBRIDEMENT WOUND;  Surgeon: Arlinda Buster, MD;  [facility];  Service: Orthopedics;  Laterality: Left;  LEFT HAND WOUND   INSERTION OF DIALYSIS CATHETER N/A 02/03/2024   Procedure: INSERTION OF DIALYSIS CATHETER;  Surgeon: Magda Debby SAILOR, MD;  [facility];  Service: Vascular;  Laterality: N/A;   LEFT HEART CATH AND CORONARY ANGIOGRAPHY N/A 12/29/2018   Procedure: LEFT HEART CATH AND CORONARY ANGIOGRAPHY;  Surgeon: Dann Candyce RAMAN, MD;  Location: [facility] INVASIVE CV LAB;  Service: Cardiovascular;  Laterality: N/A;   LIGATION OF ARTERIOVENOUS  FISTULA Left 02/03/2024   Procedure: LIGATION OF ARTERIOVENOUS  FISTULA;  Surgeon: Magda Debby SAILOR, MD;  Location: [facility] OR;  Service: Vascular;  Laterality: Left;   LOWER EXTREMITY ANGIOGRAPHY Right 05/04/2022   Procedure: Lower Extremity Angiography;  Surgeon: Lanis Fonda BRAVO, MD;  Location: [facility] INVASIVE CV LAB;  Service: Cardiovascular;  Laterality: Right;   LUNG BIOPSY     MASS EXCISION Right 01/09/2013   Procedure: EXCISION OF NEOPLASM OF RIGHT  AXILLA  AND EXCISION OF NEOPLASM OF LEFT AXILLA;  Surgeon: Oneil DELENA Budge, MD;  Location: [facility] ORS;  Service: General;  Laterality: Right;  procedure end @ 08:23   MYRINGOTOMY WITH TUBE PLACEMENT Bilateral 04/28/2017   Procedure: BILATERAL MYRINGOTOMY WITH TUBE PLACEMENT;  Surgeon: Karis Clunes, MD;  [facility] OR;  Service: ENT;  Laterality: Bilateral;   PERIPHERAL  VASCULAR BALLOON ANGIOPLASTY Right 05/04/2022   Procedure: PERIPHERAL VASCULAR BALLOON ANGIOPLASTY;  Surgeon: Lanis Fonda BRAVO, MD;  Location: [facility] INVASIVE CV LAB;  Service: Cardiovascular;  Laterality: Right;  PT   PERIPHERAL VASCULAR INTERVENTION Right 05/04/2022   Procedure: PERIPHERAL VASCULAR INTERVENTION;  Surgeon: Lanis Fonda BRAVO, MD;  Location: [facility] INVASIVE CV LAB;  Service: Cardiovascular;  Laterality: Right;  SFA   PERIPHERAL VASCULAR INTERVENTION Left 07/21/2022   Procedure: PERIPHERAL VASCULAR INTERVENTION;  Surgeon: Serene Gaile ORN, MD;  [facility];  Service: Cardiovascular;  Laterality: Left;   REVISION OF ARTERIOVENOUS GORETEX GRAFT Left 05/04/2018   Procedure: TRANSPOSITION OF CEPHALIC VEIN ARTERIOVENOUS FISTULA LEFT ARM;  Surgeon: Oris Krystal FALCON, MD;  [facility];  Service: Vascular;  Laterality: Left;   SAVORY DILATION N/A 12/18/2016   Procedure: SAVORY DILATION;  Surgeon: Harvey Margo CROME, MD;  [facility];  Service: Endoscopy;  Laterality:  N/A;   TRANSMETATARSAL AMPUTATION Left 08/15/2022   Procedure: TRANSMETATARSAL AMPUTATION;  Surgeon: Silva Juliene SAUNDERS, DPM;  [facility];  Service: Podiatry;  Laterality: Left;    Family History  Problem Relation Age of Onset   Hypertension Father    Hypercholesterolemia Father    Arthritis Father    Hypertension Sister    Hypercholesterolemia Sister    Breast cancer Sister    Hypertension Sister    Colon cancer Neg Hx    Colon polyps Neg Hx     Social History   Socioeconomic History   Marital status: Married    Spouse name: Enes Wegener   Number of children: 2   Years of education: 12   Highest education level: Associate degree: occupational, scientist, product/process development, or vocational program  Occupational History   Occupation: retired   Tobacco Use   Smoking status: Never    Passive exposure: Never   Smokeless tobacco: Never   Tobacco comments:    Verified by Daughter, [name]  Vaping Use   Vaping status: Never Used  Substance and Sexual Activity   Alcohol use: No   Drug use: No   Sexual activity: Not Currently    Partners: Male  Other Topics Concern   Not on file  Social History Narrative   Lives alone with husband    Right handed   Caffeine-1/2 daily   Social Drivers of Health   Tobacco Use: Low Risk (03/15/2024)   Patient History    Smoking Tobacco Use: Never    Smokeless Tobacco Use: Never    Passive Exposure: Never  Financial Resource Strain: Low Risk (02/22/2024)   Overall Financial Resource Strain (CARDIA)    Difficulty of Paying Living Expenses: Not hard at all  Food Insecurity: No Food Insecurity (02/22/2024)   Epic    Worried About Radiation Protection Practitioner of Food in the Last Year: Never true    Ran Out of Food in the Last Year: Never true  Transportation Needs: No Transportation Needs (02/22/2024)   Epic    Lack of Transportation (Medical): No    Lack of Transportation (Non-Medical): No  Physical Activity: Inactive (02/22/2024)   Exercise Vital Sign    Days  of Exercise per Week: 0 days    Minutes of Exercise per Session: Not on file  Stress: Stress Concern Present (02/22/2024)   [employer] of Occupational Health - Occupational Stress Questionnaire    Feeling of Stress: To some extent  Social Connections: Moderately Integrated (02/22/2024)   Social Connection and Isolation Panel    Frequency of Communication  with Friends and Family: More than three times a week    Frequency of Social Gatherings with Friends and Family: More than three times a week    Attends Religious Services: 1 to 4 times per year    Active Member of [employer] or Organizations: No    Attends Banker Meetings: Not on file    Marital Status: Married  Recent Concern: Social Connections - Moderately Isolated (11/25/2023)   Social Connection and Isolation Panel    Frequency of Communication with Friends and Family: Three times a week    Frequency of Social Gatherings with Friends and Family: Three times a week    Attends Religious Services: Patient declined    Active Member of Clubs or Organizations: No    Attends Banker Meetings: Never    Marital Status: Married  Catering Manager Violence: Not At Risk (02/05/2024)   Epic    Fear of Current or Ex-Partner: No    Emotionally Abused: No    Physically Abused: No    Sexually Abused: No  Depression (PHQ2-9): High Risk (02/22/2024)   Depression (PHQ2-9)    PHQ-2 Score: 14  Alcohol Screen: Low Risk (05/03/2023)   Alcohol Screen    Last Alcohol Screening Score (AUDIT): 0  Housing: Low Risk (02/22/2024)   Epic    Unable to Pay for Housing in the Last Year: No    Number of Times Moved in the Last Year: 0    Homeless in the Last Year: No  Utilities: Not At Risk (02/05/2024)   Epic    Threatened with loss of utilities: No  Health Literacy: Adequate Health Literacy (05/03/2023)   B1300 Health Literacy    Frequency of need for help with medical instructions: Rarely  Recent Concern: Health Literacy -  Inadequate Health Literacy (02/08/2023)   B1300 Health Literacy    Frequency of need for help with medical instructions: Often    Allergies[1]  Current Facility-Administered Medications  Medication Dose Route Frequency Provider Last Rate Last Admin   fentaNYL  (SUBLIMAZE ) injection    PRN Magda Debby SAILOR, MD       Heparin  (Porcine) in NaCl 1000-0.9 UT/500ML-% SOLN    PRN Braydin Aloi N, MD   500 mL at 03/17/24 1028   lidocaine  (PF) (XYLOCAINE ) 1 % injection    PRN Magda Debby SAILOR, MD   20 mL at 03/17/24 1029   midazolam  PF (VERSED ) injection    PRN Magda Debby SAILOR, MD       sodium chloride  flush (NS) 0.9 % injection 3 mL  3 mL Intravenous PRN Magda Debby SAILOR, MD       Facility-Administered Medications Ordered in Other Encounters  Medication Dose Route Frequency Provider Last Rate Last Admin   0.9 %  sodium chloride  infusion   Intravenous Continuous Lockamy, Randi L, NP-C 20 mL/hr at 02/03/24 1625 New Bag at 02/03/24 1657   inclisiran (LEQVIO ) injection 284 mg  284 mg Subcutaneous Once Patel, Vaishali K, RPH       inclisiran (LEQVIO ) injection 284 mg  284 mg Subcutaneous Once Patel, Vaishali K, [facility]        PHYSICAL EXAM Vitals:   03/17/24 0804 03/17/24 1005  BP: (!) 173/78   Pulse: 69   Resp: 17   Temp: 98.6 F (37 C)   TempSrc: Oral   SpO2: 97% 99%  Weight: 64.9 kg   Height: 5' 4 (1.626 m)    No distress Scarred R neck c/w TDC removal.  PERTINENT LABORATORY AND RADIOLOGIC DATA  Most recent CBC    Latest Ref Rng & Units 03/17/2024    8:50 AM 03/15/2024    9:35 AM 02/05/2024    4:23 AM  CBC  WBC 4.0 - 10.5 K/uL   8.1   Hemoglobin 12.0 - 15.0 g/dL 8.8  9.5  7.8   Hematocrit 36.0 - 46.0 % 26.0  28.0  25.7   Platelets 150 - 400 K/uL   404      Most recent CMP    Latest Ref Rng & Units 03/17/2024    8:50 AM 03/15/2024    9:35 AM 02/05/2024    4:23 AM  CMP  Glucose 70 - 99 mg/dL 819  816  828   BUN 8 - 23 mg/dL 62  30  18   Creatinine 0.44 - 1.00 mg/dL 2.69   5.39  7.27   Sodium 135 - 145 mmol/L 145  144  133   Potassium 3.5 - 5.1 mmol/L 4.1  3.4  3.8   Chloride 98 - 111 mmol/L 108  104  95   CO2 22 - 32 mmol/L   27   Calcium  8.9 - 10.3 mg/dL   8.0     Renal function Estimated Creatinine Clearance: 6.5 mL/min (A) (by C-G formula based on SCr of 7.3 mg/dL (H)).  Hemoglobin-A1c (no units)  Date Value  08/22/2013 10.3   Hemoglobin A1C (no units)  Date Value  07/17/2019 7.8   HbA1c, POC (controlled diabetic range) (%)  Date Value  06/18/2020 8.4 (A)   Hgb A1c MFr Bld (%)  Date Value  10/27/2023 6.7 (H)    LDL Cholesterol (Calc)  Date Value Ref Range Status  11/12/2017 109 (H) mg/dL (calc) Final    Comment:    Reference range: <100 . Desirable range <100 mg/dL for primary prevention;   <70 mg/dL for patients with CHD or diabetic patients  with > or = 2 CHD risk factors. [name] LDL-C is now calculated using the Martin-Hopkins  calculation, which is a validated novel method providing  better accuracy than the Friedewald equation in the  estimation of LDL-C.  Gladis APPLETHWAITE et al. SANDREA. 7986;689(80): 2061-2068  (http://education.[employer].com/faq/FAQ164)    LDL Cholesterol  Date Value Ref Range Status  12/18/2022 61 0 - 99 mg/dL Final    Comment:           Total Cholesterol/HDL:CHD Risk Coronary Heart Disease Risk Table                     Men   Women  1/2 Average Risk   3.4   3.3  Average Risk       5.0   4.4  2 X Average Risk   9.6   7.1  3 X Average Risk  23.4   11.0        Use the calculated Patient Ratio above and the CHD Risk Table to determine the patient's CHD Risk.        ATP III CLASSIFICATION (LDL):  <100     mg/dL   Optimal  899-870  mg/dL   Near or Above                    Optimal  130-159  mg/dL   Borderline  839-810  mg/dL   High  >809     mg/dL   Very High Performed at [facility], [address], [place], [place] [address]  Direct LDL  Date Value Ref Range Status  11/21/2020 120.0 mg/dL  Final    Comment:    Optimal:  <100 mg/dLNear or Above Optimal:  100-129 mg/dLBorderline High:  130-159 mg/dLHigh:  160-189 mg/dLVery High:  >190 mg/dL     Debby SAILOR. Magda, MD FACS [facility] Phone Number: [phone] 03/17/2024 10:45 AM   Total time spent on preparing this encounter including chart review, data review, collecting history, examining the patient, and coordinating care: 30 min  Portions of this report may have been transcribed using voice recognition software.  Every effort has been made to ensure accuracy; however, inadvertent computerized transcription errors may still be present.       [1]  Allergies Allergen Reactions   Ace Inhibitors Anaphylaxis and Swelling   Penicillins Itching and Swelling    Has tolerated cefazolin  on multiple occasions    Statins Other (See Comments)    Elevated LFT's   Albuterol  Swelling

## 2024-03-17 NOTE — Op Note (Signed)
 DATE OF SERVICE: 03/17/2024  PATIENT:  Kathryn Beck  68 y.o. female  PRE-OPERATIVE DIAGNOSIS:  end-stage renal disease;  POST-OPERATIVE DIAGNOSIS:  Same  PROCEDURE:   1) Ultrasound guided right internal jugular access  2) placement of tunneled dialysis catheter   SURGEON:  Debby SAILOR. Magda, MD  ASSISTANT: none  ANESTHESIA:   local  ESTIMATED BLOOD LOSS: min  LOCAL MEDICATIONS USED:  LIDOCAINE    COUNTS: confirmed correct.  PATIENT DISPOSITION:  PACU - hemodynamically stable.   Delay start of Pharmacological VTE agent (>24hrs) due to surgical blood loss or risk of bleeding: no  INDICATION FOR PROCEDURE: Kathryn Beck is a 68 y.o. female with ESRD on HD. Her TDC was inadvertently removed. After careful discussion of risks, benefits, and alternatives the patient was offered [facility] placement. The patient understood and wished to proceed.  OPERATIVE FINDINGS:  Successful placement of 19cm TDC via right internal jugular vein  DESCRIPTION OF PROCEDURE: After identification of the patient in the pre-operative holding area, the patient was transferred to the operating room. The patient was positioned supine on the operating room table.  The right neck was prepped and draped in standard fashion. A surgical pause was performed confirming correct patient, procedure, and operative location.  Using ultrasound guidance the right internal jugular vein was accessed with micropuncture technique.  Through the micropuncture sheath a floppy J-wire was advanced into the superior vena cava.  A small incision was made around the skin access point.  The access point was serially dilated under direct fluoroscopic guidance.  A peel-away sheath was introduced into the superior vena cava under fluoroscopic guidance.  A counterincision was made in the chest under the clavicle.  A 19 cm tunnel dialysis catheter was then tunneled under the skin, over the clavicle into the incision in the neck.  The tunneling  device was removed and the catheter fed through the peel-away sheath into the superior vena cava.  The peel-away sheath was removed and the catheter gently pulled back.  Adequate position was confirmed with x-ray.  The catheter was tested and found to flush and draw back well.  Catheter was heparin  locked.  Caps were applied.  Catheter was sutured to the skin.  The neck incision was closed with 4-0 Monocryl.  Upon completion of the case instrument and sharps counts were confirmed correct. The patient was transferred to the PACU in good condition. I was present for all portions of the procedure.  PLAN: OK to use TDC. Follow up as scheduled.  Debby SAILOR. Magda, MD [facility] Phone Number: [phone] 03/17/2024 10:46 AM

## 2024-03-20 ENCOUNTER — Telehealth: Payer: Self-pay

## 2024-03-20 ENCOUNTER — Encounter (HOSPITAL_COMMUNITY): Payer: Self-pay | Admitting: Vascular Surgery

## 2024-03-20 ENCOUNTER — Encounter: Payer: Self-pay | Admitting: *Deleted

## 2024-03-20 MED FILL — Heparin Sodium (Porcine) Inj 1000 Unit/ML: INTRAMUSCULAR | Qty: 10 | Status: AC

## 2024-03-20 NOTE — Telephone Encounter (Signed)
 Verbal orders given to chelsea

## 2024-03-20 NOTE — Telephone Encounter (Signed)
 Copied from CRM #[id number]. Topic: Clinical - Home Health Verbal Orders >> Mar 17, 2024 12:52 PM Delon DASEN wrote: Caller/Agency: Chelsea with [facility] Callback Number: [phone] Service Requested: Skilled Nursing Frequency: n/a Any new concerns about the patient? No- missing notes from 05/26/2023 for claim

## 2024-03-21 ENCOUNTER — Ambulatory Visit: Admitting: Gastroenterology

## 2024-03-21 ENCOUNTER — Ambulatory Visit (HOSPITAL_COMMUNITY): Attending: Occupational Therapy | Admitting: Occupational Therapy

## 2024-03-21 ENCOUNTER — Telehealth (HOSPITAL_COMMUNITY): Payer: Self-pay | Admitting: Occupational Therapy

## 2024-03-21 NOTE — Telephone Encounter (Signed)
 Attempted to call pt, as well WC rep attempting to call - unable to leave VM. WC Rep reports that he will continue to attempt to call to reschedule evaluation.   Valentin Nightingale, OTR/L [facility] [phone]

## 2024-03-24 ENCOUNTER — Encounter (HOSPITAL_COMMUNITY): Payer: Self-pay

## 2024-03-24 ENCOUNTER — Emergency Department (HOSPITAL_COMMUNITY)
Admission: EM | Admit: 2024-03-24 | Discharge: 2024-03-24 | Disposition: A | Discharge status: Home / Self Care | Source: Home / Self Care | Attending: Emergency Medicine | Admitting: Emergency Medicine

## 2024-03-24 ENCOUNTER — Other Ambulatory Visit: Payer: Self-pay

## 2024-03-24 DIAGNOSIS — I491 Atrial premature depolarization: Secondary | ICD-10-CM | POA: Insufficient documentation

## 2024-03-24 DIAGNOSIS — Z992 Dependence on renal dialysis: Secondary | ICD-10-CM | POA: Insufficient documentation

## 2024-03-24 DIAGNOSIS — N186 End stage renal disease: Secondary | ICD-10-CM | POA: Insufficient documentation

## 2024-03-24 DIAGNOSIS — J029 Acute pharyngitis, unspecified: Secondary | ICD-10-CM | POA: Insufficient documentation

## 2024-03-24 DIAGNOSIS — Z7982 Long term (current) use of aspirin: Secondary | ICD-10-CM | POA: Insufficient documentation

## 2024-03-24 LAB — RESP PANEL BY RT-PCR (RSV, FLU A&B, COVID)  RVPGX2
Influenza A by PCR: NEGATIVE
Influenza B by PCR: NEGATIVE
Resp Syncytial Virus by PCR: NEGATIVE
SARS Coronavirus 2 by RT PCR: NEGATIVE

## 2024-03-24 NOTE — ED Triage Notes (Signed)
 Pt bib rcems from dialysis with complaints of sore throat and ear pain. Pt states that it started 3 days ago. Pt did receive her full dialysis today.

## 2024-03-24 NOTE — Discharge Instructions (Signed)
 Your testing has, no signs of COVID or the flu.  Your EKG does not show atrial fibrillation Return to the ER for severe or worsening symptoms

## 2024-03-24 NOTE — ED Provider Notes (Signed)
 " [place] [facility] AT [facility] Provider Note   CSN: [id number] Arrival date & time: 03/24/24  1229     Patient presents with: Sore Throat   Kathryn Beck is a 69 y.o. female.    Sore Throat   This patient is a 69 year old female coming from dialysis with a complaint of feeling generally weak and having a decreased level of consciousness.  According to the nurse practitioner that I spoke with by phone at the [facility] dialysis center the patient had completed her entire treatment and at the end started to feel like she was generally weak, they felt like she had some altered mental status and was somnolent thus they called the paramedics.  The paramedics report that the patient only complains of a sore throat and feeling some general fatigue.  The patient does not have a sore throat at this time and states that several days ago she did have a mild upper respiratory infection with a sore throat and a stuffy head but that has gone away now.  She states that she feels generally tired but does not have any chest pain shortness of breath fevers nausea vomiting diarrhea or any other complaints.    Prior to Admission medications  Medication Sig Start Date End Date Taking? Authorizing Provider  acetaminophen  (TYLENOL ) 500 MG tablet Take 1,000 mg by mouth every 6 (six) hours as needed for mild pain (pain score 1-3).    [provider]  amLODipine  (NORVASC ) 10 MG tablet Take 0.5 tablets (5 mg total) by mouth daily. 10/28/23   Pearlean Manus, MD  ASPIRIN  LOW DOSE 81 MG chewable tablet Chew 162 mg by mouth daily. 01/05/24   [provider]  B Complex-C-Folic Acid  (RENA-VITE RX) 1 MG TABS Take 1 tablet by mouth daily. 04/15/22   [provider]  busPIRone  (BUSPAR ) 7.5 MG tablet Take 1 tablet (7.5 mg total) by mouth 3 (three) times daily. 02/22/24   Antonetta Rollene BRAVO, MD  calcitRIOL  (ROCALTROL ) 0.5 MCG capsule Take 2 capsules (1 mcg total) by mouth every  Monday, Wednesday, and Friday with hemodialysis. 11/05/23   Caleen Burgess BROCKS, MD  cinacalcet  (SENSIPAR ) 30 MG tablet Take 3 tablets (90 mg total) by mouth every Monday, Wednesday, and Friday. 12/08/23   Johnson, Clanford L, MD  ezetimibe  (ZETIA ) 10 MG tablet Take 1 tablet (10 mg total) by mouth daily. 04/27/23   Antonetta Rollene BRAVO, MD  insulin  aspart (NOVOLOG ) 100 UNIT/ML injection Inject 0-6 Units into the skin 3 (three) times daily with meals. CBG 70 - 120: 0 units  CBG 121 - 150: 0 units  CBG 151 - 200: 1 unit  CBG 201-250: 2 units  CBG 251-300: 3 units  CBG 301-350: 4 units  CBG 351-400: 5 units  CBG > 400: Give 10 units and call MD 11/12/23   Shahmehdi, Seyed A, MD  insulin  glargine, 2 Unit Dial , (TOUJEO  MAX SOLOSTAR) 300 UNIT/ML Solostar Pen Inject 30 Units into the skin daily. May need to slowly increase the dose depending upon your blood sugar, follow-up with PCP 01/01/23   Trixie File, MD  metoprolol  succinate (TOPROL -XL) 50 MG 24 hr tablet TAKE ONE TABLET BY MOUTH ONCE DAILY WITH FOOD (TAKE AFTER DIALYSIS) 12/20/23   Antonetta Rollene BRAVO, MD  mirtazapine  (REMERON ) 7.5 MG tablet Take 1 tablet (7.5 mg total) by mouth at bedtime. 03/06/24   Antonetta Rollene BRAVO, MD  ondansetron  (ZOFRAN ) 4 MG tablet Take 4 mg by mouth every 8 (eight)  hours as needed for nausea or vomiting. 11/26/23   [provider]  pantoprazole  (PROTONIX ) 40 MG tablet TAKE ONE TABLET BY MOUTH EVERY DAY 03/01/24   Antonetta Rollene BRAVO, MD  pregabalin  (LYRICA ) 50 MG capsule Take 1 capsule (50 mg total) by mouth 3 (three) times daily. 03/03/24   Antonetta Rollene BRAVO, MD  senna (SENOKOT) 8.6 MG TABS tablet Take 1 tablet (8.6 mg total) by mouth daily as needed for mild constipation or moderate constipation. 02/06/24   Arlice Reichert, MD  sertraline  (ZOLOFT ) 100 MG tablet Take 1 tablet (100 mg total) by mouth daily. 02/22/24   Antonetta Rollene BRAVO, MD  [name]  500 MG chewable tablet Chew 1-2 tablets (500-1,000 mg total) by mouth  See admin instructions. Take 1000mg  (2 tablets) by mouth with meals and 500mg  (1 tablet) with snacks 08/23/23   Antonetta Rollene BRAVO, MD  FLUoxetine (PROZAC) 10 MG capsule Take 10 mg by mouth daily.    05/28/11  [provider]  glipiZIDE  (GLUCOTROL ) 10 MG tablet Take 10 mg by mouth 2 (two) times daily before a meal.    05/28/11  [provider]    Allergies: Ace inhibitors, Penicillins, Statins, and Albuterol     Review of Systems  All other systems reviewed and are negative.   Updated Vital Signs BP 130/75   Pulse 76   Temp 98.6 F (37 C) (Oral)   Resp 17   Ht 1.626 m (5' 4)   Wt 64.9 kg   SpO2 92%   BMI 24.54 kg/m   Physical Exam Vitals and nursing note reviewed.  Constitutional:      General: She is not in acute distress.    Appearance: She is well-developed.  HENT:     Head: Normocephalic and atraumatic.     Mouth/Throat:     Pharynx: No oropharyngeal exudate.  Eyes:     General: No scleral icterus.       Right eye: No discharge.        Left eye: No discharge.     Conjunctiva/sclera: Conjunctivae normal.     Pupils: Pupils are equal, round, and reactive to light.  Neck:     Thyroid : No thyromegaly.     Vascular: No JVD.  Cardiovascular:     Rate and Rhythm: Normal rate and regular rhythm.     Heart sounds: Murmur heard.     No friction rub. No gallop.     Comments: Normal heart rate, systolic murmur auscultated Pulmonary:     Effort: Pulmonary effort is normal. No respiratory distress.     Breath sounds: Normal breath sounds. No wheezing or rales.  Abdominal:     General: Bowel sounds are normal. There is no distension.     Palpations: Abdomen is soft. There is no mass.     Tenderness: There is no abdominal tenderness.  Musculoskeletal:        General: No tenderness. Normal range of motion.     Cervical back: Normal range of motion and neck supple.     Right lower leg: No edema.     Left lower leg: No edema.     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  Lymphadenopathy:     Cervical: No cervical adenopathy.  Skin:    General: Skin is warm and dry.     Findings: No erythema or rash.  Neurological:     Mental Status: She is alert.     Coordination:  Coordination normal.     Comments: The patient is able to follow commands and answer my questions, she does not appear to be in distress, she is sleeping but easily arousable and answers questions appropriately with no slurred speech or facial droop  Psychiatric:        Behavior: Behavior normal.     (all labs ordered are listed, but only abnormal results are displayed) Labs Reviewed  RESP PANEL BY RT-PCR (RSV, FLU A&B, COVID)  [id number]    EKG: EKG Interpretation Date/Time:  Friday March 24 2024 13:16:19 EST Ventricular Rate:  84 PR Interval:  151 QRS Duration:  89 QT Interval:  407 QTC Calculation: 482 R Axis:   38  Text Interpretation: Sinus rhythm Atrial premature complex Probable left atrial enlargement Probable LVH with secondary repol abnrm Confirmed by Cleotilde Rogue ([id number]) on 03/24/2024 1:31:29 PM  Radiology: No results found.   Procedures   Medications Ordered in the ED - No data to display                                  Medical Decision Making Amount and/or Complexity of Data Reviewed ECG/medicine tests: ordered.   This patient presents with some changes in mental status after dialysis although it appears to be extremely mild if any.  She does not have any confusion or disorientation nor does she have any focal findings on her exam.  Her vital signs are unremarkable and shows no signs of hypotension fever or tachycardia.  She does have a heart murmur, she had an echocardiogram performed March 2025, this showed that the patient had moderate aortic valve stenosis that is likely consistent with this finding.  Labs:  I  personally viewed and interpreted the labs which show negative for COVID and  flu  Cardiac monitoring/EKG: EKG is unremarkable showing no signs of A-fib  Repeat evaluation with the significant other at the bedside and the daughter on the phone, the patient has normal vital signs, occasional PACs but not in atrial fibrillation, vital signs reflect no fever tachycardia or hypotension, she is not hypoxic, she states that she feels better and is less somnolent, appears stable for discharge     Final diagnoses:  Premature atrial contraction  ESRD (end stage renal disease) (HCC)  Sore throat    ED Discharge Orders     None          Cleotilde Rogue, MD 03/24/24 1541  "

## 2024-03-26 ENCOUNTER — Emergency Department (HOSPITAL_COMMUNITY)

## 2024-03-26 ENCOUNTER — Inpatient Hospital Stay (HOSPITAL_COMMUNITY)

## 2024-03-26 ENCOUNTER — Encounter (HOSPITAL_COMMUNITY): Payer: Self-pay | Admitting: Family Medicine

## 2024-03-26 ENCOUNTER — Other Ambulatory Visit: Payer: Self-pay

## 2024-03-26 DIAGNOSIS — Y848 Other medical procedures as the cause of abnormal reaction of the patient, or of later complication, without mention of misadventure at the time of the procedure: Secondary | ICD-10-CM | POA: Diagnosis present

## 2024-03-26 DIAGNOSIS — N186 End stage renal disease: Secondary | ICD-10-CM | POA: Diagnosis present

## 2024-03-26 DIAGNOSIS — D631 Anemia in chronic kidney disease: Secondary | ICD-10-CM | POA: Diagnosis present

## 2024-03-26 DIAGNOSIS — Z66 Do not resuscitate: Secondary | ICD-10-CM | POA: Diagnosis present

## 2024-03-26 DIAGNOSIS — Z8616 Personal history of COVID-19: Secondary | ICD-10-CM | POA: Diagnosis not present

## 2024-03-26 DIAGNOSIS — I132 Hypertensive heart and chronic kidney disease with heart failure and with stage 5 chronic kidney disease, or end stage renal disease: Secondary | ICD-10-CM | POA: Diagnosis present

## 2024-03-26 DIAGNOSIS — T80211A Bloodstream infection due to central venous catheter, initial encounter: Principal | ICD-10-CM | POA: Diagnosis present

## 2024-03-26 DIAGNOSIS — I5033 Acute on chronic diastolic (congestive) heart failure: Secondary | ICD-10-CM | POA: Diagnosis present

## 2024-03-26 DIAGNOSIS — G4733 Obstructive sleep apnea (adult) (pediatric): Secondary | ICD-10-CM | POA: Diagnosis present

## 2024-03-26 DIAGNOSIS — Z8673 Personal history of transient ischemic attack (TIA), and cerebral infarction without residual deficits: Secondary | ICD-10-CM

## 2024-03-26 DIAGNOSIS — Z888 Allergy status to other drugs, medicaments and biological substances status: Secondary | ICD-10-CM

## 2024-03-26 DIAGNOSIS — E669 Obesity, unspecified: Secondary | ICD-10-CM | POA: Diagnosis present

## 2024-03-26 DIAGNOSIS — Z79899 Other long term (current) drug therapy: Secondary | ICD-10-CM

## 2024-03-26 DIAGNOSIS — F40240 Claustrophobia: Secondary | ICD-10-CM | POA: Diagnosis present

## 2024-03-26 DIAGNOSIS — F32A Depression, unspecified: Secondary | ICD-10-CM | POA: Diagnosis present

## 2024-03-26 DIAGNOSIS — E1140 Type 2 diabetes mellitus with diabetic neuropathy, unspecified: Secondary | ICD-10-CM | POA: Diagnosis present

## 2024-03-26 DIAGNOSIS — N2581 Secondary hyperparathyroidism of renal origin: Secondary | ICD-10-CM | POA: Diagnosis present

## 2024-03-26 DIAGNOSIS — E1122 Type 2 diabetes mellitus with diabetic chronic kidney disease: Secondary | ICD-10-CM | POA: Diagnosis present

## 2024-03-26 DIAGNOSIS — Z89511 Acquired absence of right leg below knee: Secondary | ICD-10-CM

## 2024-03-26 DIAGNOSIS — R6521 Severe sepsis with septic shock: Secondary | ICD-10-CM | POA: Diagnosis present

## 2024-03-26 DIAGNOSIS — Z1152 Encounter for screening for COVID-19: Secondary | ICD-10-CM | POA: Diagnosis not present

## 2024-03-26 DIAGNOSIS — I120 Hypertensive chronic kidney disease with stage 5 chronic kidney disease or end stage renal disease: Secondary | ICD-10-CM | POA: Diagnosis not present

## 2024-03-26 DIAGNOSIS — Z7901 Long term (current) use of anticoagulants: Secondary | ICD-10-CM | POA: Diagnosis not present

## 2024-03-26 DIAGNOSIS — Z794 Long term (current) use of insulin: Secondary | ICD-10-CM

## 2024-03-26 DIAGNOSIS — I10 Essential (primary) hypertension: Secondary | ICD-10-CM | POA: Diagnosis not present

## 2024-03-26 DIAGNOSIS — A4101 Sepsis due to Methicillin susceptible Staphylococcus aureus: Secondary | ICD-10-CM | POA: Diagnosis present

## 2024-03-26 DIAGNOSIS — Z880 Allergy status to penicillin: Secondary | ICD-10-CM

## 2024-03-26 DIAGNOSIS — Z803 Family history of malignant neoplasm of breast: Secondary | ICD-10-CM

## 2024-03-26 DIAGNOSIS — R4182 Altered mental status, unspecified: Secondary | ICD-10-CM | POA: Diagnosis not present

## 2024-03-26 DIAGNOSIS — E1151 Type 2 diabetes mellitus with diabetic peripheral angiopathy without gangrene: Secondary | ICD-10-CM | POA: Diagnosis present

## 2024-03-26 DIAGNOSIS — G9341 Metabolic encephalopathy: Secondary | ICD-10-CM | POA: Diagnosis present

## 2024-03-26 DIAGNOSIS — I5082 Biventricular heart failure: Secondary | ICD-10-CM | POA: Diagnosis not present

## 2024-03-26 DIAGNOSIS — K922 Gastrointestinal hemorrhage, unspecified: Secondary | ICD-10-CM | POA: Diagnosis present

## 2024-03-26 DIAGNOSIS — A419 Sepsis, unspecified organism: Secondary | ICD-10-CM | POA: Diagnosis present

## 2024-03-26 DIAGNOSIS — Z7984 Long term (current) use of oral hypoglycemic drugs: Secondary | ICD-10-CM

## 2024-03-26 DIAGNOSIS — D3501 Benign neoplasm of right adrenal gland: Secondary | ICD-10-CM | POA: Diagnosis present

## 2024-03-26 DIAGNOSIS — I480 Paroxysmal atrial fibrillation: Secondary | ICD-10-CM | POA: Diagnosis present

## 2024-03-26 DIAGNOSIS — Z515 Encounter for palliative care: Secondary | ICD-10-CM

## 2024-03-26 DIAGNOSIS — R569 Unspecified convulsions: Secondary | ICD-10-CM | POA: Diagnosis not present

## 2024-03-26 DIAGNOSIS — F411 Generalized anxiety disorder: Secondary | ICD-10-CM | POA: Diagnosis present

## 2024-03-26 DIAGNOSIS — Z91199 Patient's noncompliance with other medical treatment and regimen due to unspecified reason: Secondary | ICD-10-CM

## 2024-03-26 DIAGNOSIS — Z992 Dependence on renal dialysis: Secondary | ICD-10-CM | POA: Diagnosis not present

## 2024-03-26 DIAGNOSIS — I468 Cardiac arrest due to other underlying condition: Secondary | ICD-10-CM | POA: Diagnosis not present

## 2024-03-26 DIAGNOSIS — I2510 Atherosclerotic heart disease of native coronary artery without angina pectoris: Secondary | ICD-10-CM | POA: Diagnosis present

## 2024-03-26 DIAGNOSIS — R7989 Other specified abnormal findings of blood chemistry: Secondary | ICD-10-CM | POA: Diagnosis not present

## 2024-03-26 DIAGNOSIS — I739 Peripheral vascular disease, unspecified: Secondary | ICD-10-CM | POA: Diagnosis present

## 2024-03-26 DIAGNOSIS — N3000 Acute cystitis without hematuria: Secondary | ICD-10-CM | POA: Diagnosis present

## 2024-03-26 DIAGNOSIS — E876 Hypokalemia: Secondary | ICD-10-CM | POA: Diagnosis present

## 2024-03-26 DIAGNOSIS — I4819 Other persistent atrial fibrillation: Secondary | ICD-10-CM | POA: Diagnosis present

## 2024-03-26 DIAGNOSIS — J9601 Acute respiratory failure with hypoxia: Secondary | ICD-10-CM | POA: Diagnosis present

## 2024-03-26 DIAGNOSIS — T80211D Bloodstream infection due to central venous catheter, subsequent encounter: Secondary | ICD-10-CM | POA: Diagnosis not present

## 2024-03-26 DIAGNOSIS — D3502 Benign neoplasm of left adrenal gland: Secondary | ICD-10-CM | POA: Diagnosis present

## 2024-03-26 DIAGNOSIS — Z90710 Acquired absence of both cervix and uterus: Secondary | ICD-10-CM

## 2024-03-26 DIAGNOSIS — Z7982 Long term (current) use of aspirin: Secondary | ICD-10-CM

## 2024-03-26 DIAGNOSIS — G9389 Other specified disorders of brain: Secondary | ICD-10-CM | POA: Diagnosis present

## 2024-03-26 DIAGNOSIS — N390 Urinary tract infection, site not specified: Secondary | ICD-10-CM | POA: Diagnosis present

## 2024-03-26 DIAGNOSIS — I471 Supraventricular tachycardia, unspecified: Secondary | ICD-10-CM | POA: Diagnosis present

## 2024-03-26 DIAGNOSIS — Z89112 Acquired absence of left hand: Secondary | ICD-10-CM

## 2024-03-26 DIAGNOSIS — I350 Nonrheumatic aortic (valve) stenosis: Secondary | ICD-10-CM | POA: Diagnosis present

## 2024-03-26 DIAGNOSIS — I21A1 Myocardial infarction type 2: Secondary | ICD-10-CM | POA: Diagnosis present

## 2024-03-26 DIAGNOSIS — Z8249 Family history of ischemic heart disease and other diseases of the circulatory system: Secondary | ICD-10-CM

## 2024-03-26 DIAGNOSIS — G9340 Encephalopathy, unspecified: Secondary | ICD-10-CM | POA: Diagnosis not present

## 2024-03-26 DIAGNOSIS — I429 Cardiomyopathy, unspecified: Secondary | ICD-10-CM | POA: Diagnosis present

## 2024-03-26 DIAGNOSIS — E782 Mixed hyperlipidemia: Secondary | ICD-10-CM | POA: Diagnosis present

## 2024-03-26 DIAGNOSIS — I4891 Unspecified atrial fibrillation: Secondary | ICD-10-CM | POA: Diagnosis not present

## 2024-03-26 DIAGNOSIS — Z83438 Family history of other disorder of lipoprotein metabolism and other lipidemia: Secondary | ICD-10-CM

## 2024-03-26 DIAGNOSIS — B9561 Methicillin susceptible Staphylococcus aureus infection as the cause of diseases classified elsewhere: Secondary | ICD-10-CM | POA: Diagnosis not present

## 2024-03-26 DIAGNOSIS — Z8261 Family history of arthritis: Secondary | ICD-10-CM

## 2024-03-26 DIAGNOSIS — Z89512 Acquired absence of left leg below knee: Secondary | ICD-10-CM

## 2024-03-26 DIAGNOSIS — E1169 Type 2 diabetes mellitus with other specified complication: Secondary | ICD-10-CM | POA: Diagnosis present

## 2024-03-26 DIAGNOSIS — E785 Hyperlipidemia, unspecified: Secondary | ICD-10-CM | POA: Diagnosis present

## 2024-03-26 LAB — BLOOD GAS, VENOUS
Acid-Base Excess: 0.5 mmol/L (ref 0.0–2.0)
Acid-Base Excess: 1.9 mmol/L (ref 0.0–2.0)
Bicarbonate: 25.4 mmol/L (ref 20.0–28.0)
Bicarbonate: 26.6 mmol/L (ref 20.0–28.0)
Drawn by: 44828
Drawn by: 61327
O2 Saturation: 91 %
O2 Saturation: 88.7 %
Patient temperature: 37.9
Patient temperature: 36.5
pCO2, Ven: 43 mmHg — ABNORMAL LOW (ref 44–60)
pCO2, Ven: 40 mmHg — ABNORMAL LOW (ref 44–60)
pH, Ven: 7.39 (ref 7.25–7.43)
pH, Ven: 7.43 (ref 7.25–7.43)
pO2, Ven: 65 mmHg — ABNORMAL HIGH (ref 32–45)
pO2, Ven: 52 mmHg — ABNORMAL HIGH (ref 32–45)

## 2024-03-26 LAB — COMPREHENSIVE METABOLIC PANEL WITH GFR
ALT: 23 U/L (ref 0–44)
AST: 25 U/L (ref 15–41)
Albumin: 3 g/dL — ABNORMAL LOW (ref 3.5–5.0)
Alkaline Phosphatase: 140 U/L — ABNORMAL HIGH (ref 38–126)
Anion gap: 20 — ABNORMAL HIGH (ref 5–15)
BUN: 40 mg/dL — ABNORMAL HIGH (ref 8–23)
CO2: 22 mmol/L (ref 22–32)
Calcium: 9.3 mg/dL (ref 8.9–10.3)
Chloride: 101 mmol/L (ref 98–111)
Creatinine, Ser: 4.82 mg/dL — ABNORMAL HIGH (ref 0.44–1.00)
GFR, Estimated: 9 mL/min — ABNORMAL LOW
Glucose, Bld: 224 mg/dL — ABNORMAL HIGH (ref 70–99)
Potassium: 4.1 mmol/L (ref 3.5–5.1)
Sodium: 142 mmol/L (ref 135–145)
Total Bilirubin: 0.3 mg/dL (ref 0.0–1.2)
Total Protein: 6.3 g/dL — ABNORMAL LOW (ref 6.5–8.1)

## 2024-03-26 LAB — TROPONIN T, HIGH SENSITIVITY
Troponin T High Sensitivity: 595 ng/L (ref 0–19)
Troponin T High Sensitivity: 662 ng/L (ref 0–19)
Troponin T High Sensitivity: 617 ng/L (ref 0–19)

## 2024-03-26 LAB — CBC WITH DIFFERENTIAL/PLATELET
Abs Immature Granulocytes: 0.15 K/uL — ABNORMAL HIGH (ref 0.00–0.07)
Basophils Absolute: 0 K/uL (ref 0.0–0.1)
Basophils Relative: 0 %
Eosinophils Absolute: 0 K/uL (ref 0.0–0.5)
Eosinophils Relative: 0 %
HCT: 29.8 % — ABNORMAL LOW (ref 36.0–46.0)
Hemoglobin: 8.7 g/dL — ABNORMAL LOW (ref 12.0–15.0)
Immature Granulocytes: 1 %
Lymphocytes Relative: 3 %
Lymphs Abs: 0.4 K/uL — ABNORMAL LOW (ref 0.7–4.0)
MCH: 23.9 pg — ABNORMAL LOW (ref 26.0–34.0)
MCHC: 29.2 g/dL — ABNORMAL LOW (ref 30.0–36.0)
MCV: 81.9 fL (ref 80.0–100.0)
Monocytes Absolute: 0.8 K/uL (ref 0.1–1.0)
Monocytes Relative: 7 %
Neutro Abs: 10.8 K/uL — ABNORMAL HIGH (ref 1.7–7.7)
Neutrophils Relative %: 89 %
Platelets: 276 K/uL (ref 150–400)
RBC: 3.64 MIL/uL — ABNORMAL LOW (ref 3.87–5.11)
RDW: 21.4 % — ABNORMAL HIGH (ref 11.5–15.5)
Smear Review: NORMAL
WBC: 12.2 K/uL — ABNORMAL HIGH (ref 4.0–10.5)
nRBC: 0.3 % — ABNORMAL HIGH (ref 0.0–0.2)

## 2024-03-26 LAB — CBG MONITORING, ED
Glucose-Capillary: 219 mg/dL — ABNORMAL HIGH (ref 70–99)
Glucose-Capillary: 174 mg/dL — ABNORMAL HIGH (ref 70–99)

## 2024-03-26 LAB — LACTIC ACID, PLASMA
Lactic Acid, Venous: 2.6 mmol/L (ref 0.5–1.9)
Lactic Acid, Venous: 3.5 mmol/L (ref 0.5–1.9)
Lactic Acid, Venous: 1.5 mmol/L (ref 0.5–1.9)

## 2024-03-26 LAB — GLUCOSE, CAPILLARY
Glucose-Capillary: 147 mg/dL — ABNORMAL HIGH (ref 70–99)
Glucose-Capillary: 177 mg/dL — ABNORMAL HIGH (ref 70–99)

## 2024-03-26 LAB — MRSA NEXT GEN BY PCR, NASAL: MRSA by PCR Next Gen: NOT DETECTED

## 2024-03-26 LAB — PROTIME-INR
INR: 1.4 — ABNORMAL HIGH (ref 0.8–1.2)
Prothrombin Time: 17.5 s — ABNORMAL HIGH (ref 11.4–15.2)

## 2024-03-26 MED ORDER — CHLORHEXIDINE GLUCONATE CLOTH 2 % EX PADS
6.0000 | MEDICATED_PAD | Freq: Every day | CUTANEOUS | Status: DC
  Administered 2024-03-27: 6 via TOPICAL

## 2024-03-26 MED ORDER — LACTATED RINGERS IV SOLN: INTRAVENOUS | Status: DC

## 2024-03-26 MED ORDER — ACETAMINOPHEN 325 MG PO TABS: 650.0000 mg | ORAL_TABLET | Freq: Four times a day (QID) | ORAL | Status: DC | PRN

## 2024-03-26 MED ORDER — CHLORHEXIDINE GLUCONATE CLOTH 2 % EX PADS
6.0000 | MEDICATED_PAD | Freq: Every day | CUTANEOUS | Status: DC
  Administered 2024-03-27 – 2024-03-28 (×2): 6 via TOPICAL

## 2024-03-26 MED ORDER — POLYETHYLENE GLYCOL 3350 17 G PO PACK: 17.0000 g | PACK | Freq: Every day | ORAL | Status: DC | PRN

## 2024-03-26 MED ORDER — PROCHLORPERAZINE EDISYLATE 10 MG/2ML IJ SOLN: 5.0000 mg | Freq: Four times a day (QID) | INTRAMUSCULAR | Status: DC | PRN

## 2024-03-26 MED ORDER — MELATONIN 3 MG PO TABS: 6.0000 mg | ORAL_TABLET | Freq: Every evening | ORAL | Status: DC | PRN

## 2024-03-26 MED ORDER — SODIUM CHLORIDE 0.9 % IV SOLN
2.0000 g | Freq: Once | INTRAVENOUS | Status: AC
  Administered 2024-03-26: 2 g via INTRAVENOUS
  Filled 2024-03-26: qty 20

## 2024-03-26 MED ORDER — LACTATED RINGERS IV BOLUS (SEPSIS)
1000.0000 mL | Freq: Once | INTRAVENOUS | Status: AC
  Administered 2024-03-26: 1000 mL via INTRAVENOUS

## 2024-03-26 MED ORDER — INSULIN ASPART 100 UNIT/ML IJ SOLN
0.0000 [IU] | INTRAMUSCULAR | Status: DC
  Administered 2024-03-26 – 2024-03-27 (×2): 2 [IU] via SUBCUTANEOUS
  Administered 2024-03-27 (×4): 1 [IU] via SUBCUTANEOUS
  Administered 2024-03-27 – 2024-03-28 (×2): 2 [IU] via SUBCUTANEOUS
  Administered 2024-03-28: 3 [IU] via SUBCUTANEOUS
  Administered 2024-03-28 (×2): 2 [IU] via SUBCUTANEOUS
  Administered 2024-03-29: 3 [IU] via SUBCUTANEOUS
  Filled 2024-03-26 (×2): qty 1
  Filled 2024-03-26: qty 3
  Filled 2024-03-26: qty 2
  Filled 2024-03-26 (×2): qty 1
  Filled 2024-03-26 (×2): qty 2
  Filled 2024-03-26 (×2): qty 1
  Filled 2024-03-26: qty 2

## 2024-03-26 MED ORDER — IOHEXOL 300 MG/ML  SOLN
100.0000 mL | Freq: Once | INTRAMUSCULAR | Status: AC | PRN
  Administered 2024-03-26: 100 mL via INTRAVENOUS

## 2024-03-26 MED ORDER — SODIUM CHLORIDE 0.9 % IV SOLN: 2.0000 g | INTRAVENOUS | Status: DC

## 2024-03-26 MED ORDER — ACETAMINOPHEN 650 MG RE SUPP
650.0000 mg | Freq: Once | RECTAL | Status: AC
  Administered 2024-03-26: 650 mg via RECTAL
  Filled 2024-03-26: qty 1

## 2024-03-26 MED ORDER — LACTATED RINGERS IV BOLUS (SEPSIS): 1000.0000 mL | Freq: Once | INTRAVENOUS | Status: DC

## 2024-03-26 NOTE — H&P (Signed)
 " History and Physical    Patient: Kathryn Beck FMW:[id number] DOB: 1955/12/01 DOA: 03/26/2024 DOS: the patient was seen and examined on 03/26/2024 PCP: Antonetta Rollene BRAVO, MD  Patient coming from: Home  Chief Complaint:  Chief Complaint  Patient presents with   Altered Mental Status   HPI: Kathryn Beck is a 69 y.o. female with a history including ESRD (HD MWF, complete Tx 1/2), PVD s/p bilateral BKAs and left hand amputation, HFpEF, HTN, T2DM, obesity, PAF, recently seen in this ED 1/2 for sore throat, who presented to the ED this afternoon with concern for altered mental status.  Family members reported to EMS that the patient appeared lethargic and was not answering questions appropriately, change from baseline noted around 8am when her spouse noted that the patient seemed somewhat confused and was slurring her words.   On arrival to the Emergency Department, the patient was awake and answering questions, no focal deficits. She reported abdominal pain and nausea, some shortness of breath and some mild chest discomfort. She was following commands. Rectal temperature 101.23F, HR in 100's, normotensive, 99% on 2L O2.  Lactic acid 2.6 > 3.5 despite 2L IVF. WBC 12.2k with left shift, hgb 8.7g/dl. CXR demonstrated interstitial opacities and a small right pleural effusion. CT C/A/P showed bladder wall thickening with fat stranding, possible distal left UVJ stone. She was given ceftriaxone , blood cultures drawn, and admission requested to the hospitalist service. Urology weighed in that no intervention would be needed. On assessment, RN and family both express that the patient's mentation seemed to be clearing and she was more responsive and alert, but has since grown more lethargic again, breathing quickly, not following commands. She appears to be in respiratory distress with increasing crackles on exam. Unable to obtain additional history from patient at this time.  Started BiPAP, consulted  nephrology to urgent hemodialysis here tonight, will be able to perform this in a few hours. Will admit to ICU on BiPAP, hoping to avoid intubation.    Review of Systems: unable to review all systems due to the inability of the patient to answer questions. 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  Last Assessment & Plan:   Multiple polyps with surveillance due in 2021.  IMO SNOMED Dx Update Oct 2024     History of pneumonia 2019   Hypoxia 04/03/2019   Memory loss    Mixed hyperlipidemia    Not currently working due to disabled status 06/13/2018   Formatting of this note might be different from  the original.  Last Assessment & Plan:   Formatting of this note might be different from the original.  Reports being  disabled since April/May 2019 , hospitalized then in a niursing home, now on dialysis and is unable to work     Obesity    Pancreatitis    Peritoneal dialysis catheter in place    Pneumonia due to COVID-19 virus 04/02/2019   Sleep apnea    Noncompliant with CPAP   Stroke (HCC)    mini stroke   Type 2 diabetes mellitus (HCC)    Past Surgical History:  Procedure Laterality  Date   ABDOMINAL AORTOGRAM W/LOWER EXTREMITY N/A 04/30/2022   Procedure: ABDOMINAL AORTOGRAM W/LOWER EXTREMITY;  Surgeon: Magda Debby SAILOR, MD;  Location: [facility];  Service: Cardiovascular;  Laterality: N/A;   ABDOMINAL AORTOGRAM W/LOWER EXTREMITY N/A 07/21/2022   Procedure: ABDOMINAL AORTOGRAM W/LOWER EXTREMITY;  Surgeon: Serene Gaile ORN, MD;  Location: [facility];  Service: Cardiovascular;  Laterality: N/A;   ABDOMINAL HYSTERECTOMY     ACHILLES TENDON LENGTHENING  08/15/2022   Procedure: ACHILLES TENDON LENGTHENING;  Surgeon: Silva Juliene SAUNDERS, DPM;  Location: [facility] OR;  Service: Podiatry;;   AMPUTATION Right 05/29/2022   Procedure: RIGHT BELOW THE KNEE AMPUTATION;  Surgeon: Harden Jerona GAILS, MD;  Location: [facility] OR;  Service: Orthopedics;  Laterality: Right;   AMPUTATION Left 09/04/2022   Procedure: AMPUTATION FOOT, serial irrigation;  Surgeon: Joya Stabs, DPM;  Location: [facility] OR;  Service: Podiatry;  Laterality: Left;  Surgical team to do block   AMPUTATION Left 10/07/2022   Procedure: LEFT BELOW KNEE AMPUTATION;  Surgeon: Harden Jerona GAILS, MD;  Location: [facility] OR;  Service: Orthopedics;  Laterality: Left;   AMPUTATION Left 02/03/2024   Procedure: AMPUTATION, HAND;  Surgeon: Arlinda Buster, MD;  Location: [facility] OR;  Service: Orthopedics;  Laterality: Left;  LEFT HAND AMPUTATION   AMPUTATION FINGER Left 09/29/2023   Procedure: AMPUTATION, FINGER;  Surgeon: Harden Jerona GAILS, MD;  Location: [facility] OR;  Service: Orthopedics;  Laterality: Left;  LEFT HAND LONG FINGER RAY AMPUTATION   AV FISTULA PLACEMENT Left 09/02/2017   Procedure: creation of left arm ARTERIOVENOUS (AV) FISTULA;  Surgeon: Serene Gaile ORN, MD;  Location: [facility] OR;  Service: Vascular;  Laterality: Left;   COLONOSCOPY  2008   Dr. Harvey: normal    COLONOSCOPY N/A 12/18/2016   Dr. Harvey: multiple tubular adenomas, internal hemorrhoids. Surveillance in 3 years    COLONOSCOPY N/A 12/05/2023   Procedure: COLONOSCOPY;  Surgeon: Cindie Carlin POUR, DO;  Location: [facility];  Service: Endoscopy;  Laterality: N/A;   COLONOSCOPY N/A 12/17/2023   Procedure: COLONOSCOPY;  Surgeon: Shaaron Lamar HERO, MD;  Location: [facility];  Service: Endoscopy;  Laterality: N/A;   COLONOSCOPY N/A 01/01/2024   Procedure: COLONOSCOPY;  Surgeon: Cindie Carlin POUR, DO;  Location: [facility];  Service: Endoscopy;  Laterality: N/A;   DIALYSIS/PERMA CATHETER INSERTION N/A 03/17/2024   Procedure: DIALYSIS/PERMA CATHETER INSERTION;  Surgeon: Magda Debby SAILOR, MD;  [facility];  Service: Cardiovascular;  Laterality: N/A;   ESOPHAGEAL DILATION N/A 10/13/2015   Procedure: ESOPHAGEAL DILATION;  Surgeon: Claudis RAYMOND Rivet, MD;  Location: [facility];  Service: Endoscopy;  Laterality: N/A;   ESOPHAGEAL DILATION N/A 12/03/2023   Procedure: DILATION, ESOPHAGUS;  Surgeon: Cindie Carlin POUR, DO;  Location: [facility];  Service: Endoscopy;  Laterality: N/A;   ESOPHAGOGASTRODUODENOSCOPY N/A 10/13/2015   Dr. Rivet: chronic gastritis on path, no H.pylori.  Empiric dilation    ESOPHAGOGASTRODUODENOSCOPY N/A 12/18/2016   Dr. Harvey: mild gastritis. BRAVO study revealed uncontrolled GERD. Dysphagia secondary to uncontrolled reflux   ESOPHAGOGASTRODUODENOSCOPY N/A 12/03/2023   Procedure: EGD (ESOPHAGOGASTRODUODENOSCOPY);  Surgeon: Cindie Carlin POUR, DO;  Location: [facility];  Service: Endoscopy;  Laterality: N/A;   FOOT SURGERY Bilateral    nerve     INCISION AND DRAINAGE OF WOUND Left 10/30/2023   Procedure: IRRIGATION AND DEBRIDEMENT WOUND;  Surgeon: Arlinda Buster, MD;  Location: [facility] OR;  Service: Orthopedics;  Laterality: Left;  LEFT HAND WOUND   INSERTION OF DIALYSIS CATHETER N/A 02/03/2024   Procedure: INSERTION OF DIALYSIS CATHETER;  Surgeon: Magda Debby SAILOR, MD;  Location: [facility] OR;  Service: Vascular;  Laterality: N/A;   LEFT HEART CATH AND CORONARY ANGIOGRAPHY N/A 12/29/2018   Procedure: LEFT HEART CATH AND CORONARY ANGIOGRAPHY;  Surgeon:  Dann Candyce RAMAN, MD;  Location: [facility] INVASIVE CV LAB;  Service: Cardiovascular;  Laterality: N/A;   LIGATION OF ARTERIOVENOUS  FISTULA Left 02/03/2024   Procedure: LIGATION OF ARTERIOVENOUS  FISTULA;  Surgeon: Magda Debby SAILOR, MD;  Location: [facility] OR;  Service: Vascular;  Laterality: Left;   LOWER EXTREMITY ANGIOGRAPHY Right 05/04/2022   Procedure: Lower Extremity Angiography;  Surgeon: Lanis Fonda BRAVO, MD;  Location: [facility] INVASIVE CV LAB;  Service: Cardiovascular;  Laterality: Right;   LUNG BIOPSY     MASS EXCISION Right 01/09/2013   Procedure: EXCISION OF NEOPLASM OF RIGHT  AXILLA  AND EXCISION OF NEOPLASM OF LEFT AXILLA;  Surgeon: Oneil DELENA Budge, MD;  Location: [facility] ORS;  Service: General;  Laterality: Right;  procedure end @ 08:23   MYRINGOTOMY WITH TUBE PLACEMENT Bilateral 04/28/2017   Procedure: BILATERAL MYRINGOTOMY WITH TUBE PLACEMENT;  Surgeon: Karis Clunes, MD;  Location: [facility] OR;  Service: ENT;  Laterality: Bilateral;   PERIPHERAL VASCULAR BALLOON ANGIOPLASTY Right 05/04/2022   Procedure: PERIPHERAL VASCULAR BALLOON ANGIOPLASTY;  Surgeon: Lanis Fonda BRAVO, MD;  Location: [facility] INVASIVE CV LAB;  Service: Cardiovascular;  Laterality: Right;  PT   PERIPHERAL VASCULAR INTERVENTION Right 05/04/2022   Procedure: PERIPHERAL VASCULAR INTERVENTION;  Surgeon: Lanis Fonda BRAVO, MD;  Location: [facility] INVASIVE CV LAB;  Service: Cardiovascular;  Laterality: Right;  SFA   PERIPHERAL VASCULAR INTERVENTION Left 07/21/2022   Procedure: PERIPHERAL VASCULAR INTERVENTION;  Surgeon: Serene Gaile ORN, MD;  [facility];  Service: Cardiovascular;  Laterality: Left;   REVISION OF ARTERIOVENOUS GORETEX GRAFT Left 05/04/2018   Procedure: TRANSPOSITION OF CEPHALIC VEIN ARTERIOVENOUS FISTULA LEFT ARM;  Surgeon: Oris Krystal FALCON, MD;  Location: [facility] OR;  Service: Vascular;  Laterality: Left;   SAVORY DILATION N/A 12/18/2016   Procedure: SAVORY DILATION;  Surgeon: Harvey Margo CROME, MD;  Location: [facility];  Service:  Endoscopy;  Laterality: N/A;   TRANSMETATARSAL AMPUTATION Left 08/15/2022   Procedure: TRANSMETATARSAL AMPUTATION;  Surgeon: Silva Juliene SAUNDERS, DPM;  Location: [facility] OR;  Service: Podiatry;  Laterality: Left;   Social History:  reports that she has never smoked. She has never been exposed to tobacco smoke. She has never used smokeless tobacco. She reports that she does not drink alcohol  and does not use drugs.  Allergies[1]  Family History  Problem Relation Age of Onset   Hypertension Father    Hypercholesterolemia Father    Arthritis Father    Hypertension Sister    Hypercholesterolemia Sister    Breast cancer Sister    Hypertension Sister    Colon cancer Neg Hx    Colon polyps Neg  Hx     Prior to Admission medications  Medication Sig Start Date End Date Taking? Authorizing Provider  acetaminophen  (TYLENOL ) 500 MG tablet Take 1,000 mg by mouth every 6 (six) hours as needed for mild pain (pain score 1-3).    [provider]  amLODipine  (NORVASC ) 10 MG tablet Take 0.5 tablets (5 mg total) by mouth daily. 10/28/23   Pearlean Manus, MD  ASPIRIN  LOW DOSE 81 MG chewable tablet Chew 162 mg by mouth daily. 01/05/24   [provider]  B Complex-C-Folic Acid  (RENA-VITE RX) 1 MG TABS Take 1 tablet by mouth daily. 04/15/22   [provider]  busPIRone  (BUSPAR ) 7.5 MG tablet Take 1 tablet (7.5 mg total) by mouth 3 (three) times daily. 02/22/24   Antonetta Rollene BRAVO, MD  calcitRIOL  (ROCALTROL ) 0.5 MCG capsule Take 2 capsules (1 mcg total) by mouth every Monday, Wednesday, and Friday with hemodialysis. 11/05/23   Caleen Burgess BROCKS, MD  cinacalcet  (SENSIPAR ) 30 MG tablet Take 3 tablets (90 mg total) by mouth every Monday, Wednesday, and Friday. 12/08/23   Johnson, Clanford L, MD  ezetimibe  (ZETIA ) 10 MG tablet Take 1 tablet (10 mg total) by mouth daily. 04/27/23   Antonetta Rollene BRAVO, MD  insulin  aspart (NOVOLOG ) 100 UNIT/ML injection Inject 0-6 Units into the skin 3 (three) times daily  with meals. CBG 70 - 120: 0 units  CBG 121 - 150: 0 units  CBG 151 - 200: 1 unit  CBG 201-250: 2 units  CBG 251-300: 3 units  CBG 301-350: 4 units  CBG 351-400: 5 units  CBG > 400: Give 10 units and call MD 11/12/23   Shahmehdi, Seyed A, MD  insulin  glargine, 2 Unit Dial , (TOUJEO  MAX SOLOSTAR) 300 UNIT/ML Solostar Pen Inject 30 Units into the skin daily. May need to slowly increase the dose depending upon your blood sugar, follow-up with PCP 01/01/23   Trixie File, MD  metoprolol  succinate (TOPROL -XL) 50 MG 24 hr tablet TAKE ONE TABLET BY MOUTH ONCE DAILY WITH FOOD (TAKE AFTER DIALYSIS) 12/20/23   Antonetta Rollene BRAVO, MD  mirtazapine  (REMERON ) 7.5 MG tablet Take 1 tablet (7.5 mg total) by mouth at bedtime. 03/06/24   Antonetta Rollene BRAVO, MD  ondansetron  (ZOFRAN ) 4 MG tablet Take 4 mg by mouth every 8 (eight) hours as needed for nausea or vomiting. 11/26/23   [provider]  pantoprazole  (PROTONIX ) 40 MG tablet TAKE ONE TABLET BY MOUTH EVERY DAY 03/01/24   Antonetta Rollene BRAVO, MD  pregabalin  (LYRICA ) 50 MG capsule Take 1 capsule (50 mg total) by mouth 3 (three) times daily. 03/03/24   Antonetta Rollene BRAVO, MD  senna (SENOKOT) 8.6 MG TABS tablet Take 1 tablet (8.6 mg total) by mouth daily as needed for mild constipation or moderate constipation. 02/06/24   Arlice Reichert, MD  sertraline  (ZOLOFT ) 100 MG tablet Take 1 tablet (100 mg total) by mouth daily. 02/22/24   Antonetta Rollene BRAVO, MD  [name]  500 MG chewable tablet Chew 1-2 tablets (500-1,000 mg total) by mouth See admin instructions. Take 1000mg  (2 tablets) by mouth with meals and 500mg  (1 tablet) with snacks 08/23/23   Antonetta Rollene BRAVO, MD  FLUoxetine (PROZAC) 10 MG capsule Take 10 mg by mouth daily.    05/28/11  [provider]  glipiZIDE  (GLUCOTROL ) 10 MG tablet Take 10 mg by mouth 2 (two) times daily before a meal.    05/28/11  [provider]    Physical Exam: Vitals:   03/26/24 1345 03/26/24 1359  03/26/24 1430  03/26/24 1500  BP: 111/70  99/60 98/69  Pulse: (!) 106  (!) 108 (!) 107  Resp: 17  (!) 25 (!) 30  Temp:  (!) 100.6 F (38.1 C)    TempSrc:  Rectal    SpO2: 98%  99% 100%  Weight:      Height:      Gen: Obese chronically ill-appearing female in respiratory distress Neck: No meningismus Pulm: RR ~35/min, shallow, limited excursions, with max assist rises and has bibasilar crackles without wheezes or stridor.  CV: Regular tachycardia, trace pitting dependent edema at thighs, UTD JVD. No rub or gallop.  GI: Soft, NT, ND, +BS Neuro: Limited eye movement to target, but does respond. Maintaining airway. Grunts single syllable answers with uncertain accuracy. Does move all extremities.  Ext: Warm, dry, LUE and bilateral LE amputations noted.  Skin: HD cath in right IJ nontender without erythema or discharge. No open ulcers on visualized skin.  Data Reviewed: ECG (personal interpretation): Sinus tachycardia with normal ST/T's.  CXR (personal review): Airspace opacities and interstitial opacities suggestive of pulmonary edema, possible right pleural effusion.  CXR (personal interpretation): Slightly advanced airspace opacities patchy on the right, otherwise no change.  CT head nonacute with stable left parieto-occipital encephalomalacia.  CT chest, abd, pelvis 1. Diffuse circumferential bladder wall thickening with surrounding stranding and a 3 mm punctate calcification at the left UVJ without significant left hydroureteronephrosis, most consistent with acute cystitis/ureteritis with a distal ureteral stone follow-up urinalysis/culture. 2. Stable 6 mm hyperdense filling defect with internal calcifications in the right renal pelvis, indeterminate (differential includes nonobstructing calculus, sloughed papilla, blood clot, or upper tract urothelial neoplasm). 3. Slight interval increase in size of the enhancing right lower pole renal lesion to 1.9 x 1.6 cm, suspicious for renal cell carcinoma  (differential includes oncocytoma and lipid-poor angiomyolipoma); recommend urology consultation and renal protocol MRI (preferred) or CT without and with contrast for staging/characterization and management planning. 4. Aortic atherosclerosis and coronary artery calcifications.  WBC 12.2k (10.8k ANC w/elevated immature gran's) ALC 400 LA 2.6 > 3.5 Anion gap 20. Cr 4.8, BUN 40, K and bicarb normal.  Hgb 8.7g/dl near baseline INR 1.4 Glucose 224 Albumin  3.0 Blood cultures x2 pending VBG shortly after starting BiPAP: pH 7.39, pCO2 43, pO2 65, bicarbonate 25.4 Troponin 595 Repeat lactate, troponin, RVP pending.  Assessment and Plan: Sepsis due to UTI: with organ dysfunction (altered mental status, rising lactate, leukocytosis with left shift). - Ceftriaxone  2g given, will continue.  - Follow blood cultures. HD cath does not appear infected. No longer has gangrene of left hand s/p amputation.  - Since lactic acid is cleared and normotensive, and pulmonary edema, we've halted further IVF in this anuric ESRD patient.   Acute metabolic encephalopathy: Suspect this is related to sepsis due to UTI. CT head nonacute, stable encephalomalacia.  - EDP spoke with neurology who stated low suspicion for stroke, also out of window for TNK. Could consider MRI brain.  - Given encephalomalacia, will order EEG  - Neuro checks q4h  Acute respiratory distress, acute on chronic HFpEF, pulmonary edema: Appropriate IVF resuscitation for sepsis, though worsened pulmonary edema. 2D ECHO 05/2023 w/ EF 55% and grade 1 DD  - Oxygenating but very tachypneic with belly breathing at times. VBG checked shortly after BiPAP is reassuring. Reassessed, respiratory effort normalizing. Will continue BiPAP prn.  - Manage volume with HD  ESRD:  - Given her trajectory, I've consulted nephrology who will place dialysis orders for tonight.  Ok to admit to ICU at [facility]. - RFP in AM  Possible left UVJ ureterolithiasis:  -  Urology, Dr. Alvaro, consulted. No intervention necessary at this time.   Elevated troponin: No chest pain at present. Some elevation is expected in ESRD patient with sepsis and respiratory distress.  - Trend troponin, repeat ECG is pending, but initial had no ischemic features - Check echo for [facility] - Consider cardiology evaluation depending on symptoms/trend.  - Consider heparin  if precipitously rising, though there are plenty of conditions to which this could be secondary/demand. Pt also has history of GI bleeding and anemia.   PAF: Not on anticoagulation due to GI bleed history - Currently in NSR/ST. Will monitor  Anemia of ESRD: Roughly stable - Monitor  HTN:  - Restart medications once more stable and confirmed  OSA:  - BiPAP as above  IDT2DM:  - Will provide SSI q4h for now.   Depression, anxiety:  - Continue home medications once more stable   Advance Care Planning: Full code. D/w family, no restrictions on aggressive measures up to and including intubation.   Consults: Nephrology, urology, neurology  Family Communication: Many at bedside  Severity of Illness: The appropriate patient status for this patient is INPATIENT. Inpatient status is judged to be reasonable and necessary in order to provide the required intensity of service to ensure the patient's safety. The patient's presenting symptoms, physical exam findings, and initial radiographic and laboratory data in the context of their chronic comorbidities is felt to place them at high risk for further clinical deterioration. Furthermore, it is not anticipated that the patient will be medically stable for discharge from the hospital within 2 midnights of admission.   * I certify that at the point of admission it is my clinical judgment that the patient will require inpatient hospital care spanning beyond 2 midnights from the point of admission due to high intensity of service, high risk for further deterioration and high  frequency of surveillance required.*  Author: [name], MD 03/26/2024 4:39 PM  For on call review [email or web address].      [1]  Allergies Allergen Reactions   Ace Inhibitors Anaphylaxis and Swelling   Penicillins Itching and Swelling    Has tolerated cefazolin  on multiple occasions    Statins Other (See Comments)    Elevated LFT's   Albuterol  Swelling   "

## 2024-03-26 NOTE — Progress Notes (Signed)
 Asked to provide dialysis services for this patient. Pt presented to ED at [facility] w/ AMS. Family said she appeared lethargic, slurring speech somewhat. In ED pt was awake and interacting, c/o some SOB and mild chest discomfort. Temp was 101.6, HR 100s, BP wnl, 99% on 2L O2. LA 2.6 then 3.5 despite 2 L bolus IVF's. WBC 12K w/ left shift, Hb 8.7.  CXR showed IS opacities and small R effusion. CT C/A/P showed stranding around the bladder, possible distal UVJ stone. Pt rec'd IV rocephin , blood cx's sent, and pt admitted. The admitting physician examined the patient and found her to have SOB, almost resp distress, w/ crackles on lung exam. Per resp therapy pt was placed on Bipap and I was called for possible dialysis this evening. CXR showed CM, vasc congestion and mild IS edema. Per the attending MD, pt responded well to bipap and pt has a bed in the ICU there. Plan is for HD this evening at [facility]. See orders.   OP HD: MWF [facility]  From nov 2025 --> 3.5h  B400   70.5kg  2K bath  AVF  Hep none   Pt seen, examined and agree w A/P as above.  Myer Fret MD  [facility] 03/26/2024, 6:56 PM

## 2024-03-26 NOTE — Progress Notes (Signed)
"                                  TRH, overnight cross coverage  Rising troponin from 595-662.  Small delta, consistent with type II MI.  Will follow twelve-lead EKG.  Continue to treat sepsis aggressively.    The patient was seen at her bedside.  She is on BiPAP.  She denies having any chest pain.  Trend troponin.  Will hold off on heparin  drip for now.  Time: 15 minutes. "

## 2024-03-26 NOTE — ED Provider Notes (Signed)
 " [place] EMERGENCY DEPARTMENT AT [facility] Provider Note   CSN: [id number] Arrival date & time: 03/26/24  9052     Patient presents with: Altered Mental Status   Kathryn Beck is a 69 y.o. female.    Altered Mental Status       Kathryn Beck is a 69 y.o. 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  Per EMS, they were called out for possible stroke.  Family reported to EMS that patient seemed lethargic not answering questions, was seen in the emergency department Friday for sore throat.  Was reported to me that she had negative stroke screen by EMS.  Patient is awake and will answer questions.  States to me that she has some abdominal pain and nausea.  Feels short of breath and some mild chest discomfort.  She is oriented to place and time.   Prior to Admission medications  Medication Sig Start Date End Date Taking? Authorizing Provider  acetaminophen  (TYLENOL ) 500 MG tablet Take 1,000 mg by mouth every 6 (six) hours as needed for mild pain (pain score 1-3).    [provider]  amLODipine  (NORVASC ) 10 MG tablet Take 0.5 tablets (5 mg total) by mouth daily. 10/28/23   Pearlean Manus, MD  ASPIRIN  LOW DOSE 81 MG chewable tablet Chew 162 mg by mouth daily. 01/05/24   [provider]  B Complex-C-Folic Acid  (RENA-VITE RX) 1 MG TABS Take 1 tablet by mouth daily. 04/15/22   [provider]  busPIRone  (BUSPAR ) 7.5 MG tablet Take 1 tablet (7.5 mg total) by mouth 3 (three) times daily. 02/22/24   Antonetta Rollene BRAVO, MD  calcitRIOL  (ROCALTROL ) 0.5 MCG capsule Take 2 capsules (1 mcg total) by mouth every Monday, Wednesday, and Friday with hemodialysis. 11/05/23   Caleen Burgess BROCKS, MD  cinacalcet   (SENSIPAR ) 30 MG tablet Take 3 tablets (90 mg total) by mouth every Monday, Wednesday, and Friday. 12/08/23   Johnson, Clanford L, MD  ezetimibe  (ZETIA ) 10 MG tablet Take 1 tablet (10 mg total) by mouth daily. 04/27/23   Antonetta Rollene BRAVO, MD  insulin  aspart (NOVOLOG ) 100 UNIT/ML injection Inject 0-6 Units into the skin 3 (three) times daily with meals. CBG 70 - 120: 0 units  CBG 121 - 150: 0 units  CBG 151 - 200: 1 unit  CBG 201-250: 2 units  CBG 251-300: 3 units  CBG 301-350: 4 units  CBG 351-400: 5 units  CBG > 400: Give 10 units and call MD 11/12/23   Willette Adriana LABOR, MD  insulin  glargine, 2 Unit Dial , (TOUJEO  MAX SOLOSTAR) 300 UNIT/ML Solostar Pen Inject 30 Units into the skin daily. May need to slowly increase the dose depending upon your blood sugar, follow-up with PCP 01/01/23   Trixie File, MD  metoprolol  succinate (TOPROL -XL) 50 MG 24 hr tablet TAKE ONE TABLET BY MOUTH ONCE DAILY WITH FOOD (TAKE AFTER DIALYSIS) 12/20/23   Antonetta Rollene BRAVO, MD  mirtazapine  (REMERON ) 7.5 MG tablet Take 1 tablet (7.5 mg total) by mouth at bedtime. 03/06/24   Antonetta Rollene BRAVO, MD  ondansetron  (ZOFRAN ) 4 MG tablet Take 4 mg by mouth every 8 (eight) hours as needed for nausea or vomiting. 11/26/23   [provider]  pantoprazole  (PROTONIX )  40 MG tablet TAKE ONE TABLET BY MOUTH EVERY DAY 03/01/24   Antonetta Rollene BRAVO, MD  pregabalin  (LYRICA ) 50 MG capsule Take 1 capsule (50 mg total) by mouth 3 (three) times daily. 03/03/24   Antonetta Rollene BRAVO, MD  senna (SENOKOT) 8.6 MG TABS tablet Take 1 tablet (8.6 mg total) by mouth daily as needed for mild constipation or moderate constipation. 02/06/24   Arlice Reichert, MD  sertraline  (ZOLOFT ) 100 MG tablet Take 1 tablet (100 mg total) by mouth daily. 02/22/24   Antonetta Rollene BRAVO, MD  [name]  500 MG chewable tablet Chew 1-2 tablets (500-1,000 mg total) by mouth See admin instructions. Take 1000mg  (2 tablets) by mouth with meals and 500mg  (1 tablet)  with snacks 08/23/23   Antonetta Rollene BRAVO, MD  FLUoxetine (PROZAC) 10 MG capsule Take 10 mg by mouth daily.    05/28/11  [provider]  glipiZIDE  (GLUCOTROL ) 10 MG tablet Take 10 mg by mouth 2 (two) times daily before a meal.    05/28/11  [provider]    Allergies: Ace inhibitors, Penicillins, Statins, and Albuterol     Review of Systems  Unable to perform ROS: Mental status change    Updated Vital Signs BP 120/62 (BP Location: Right Arm)   Pulse 99   Temp (!) 101.6 F (38.7 C) (Rectal)   Resp 18   Ht 5' 4 (1.626 m)   Wt 64.9 kg   BMI 24.54 kg/m   Physical Exam Vitals and nursing note reviewed.  Constitutional:      Appearance: She is ill-appearing.     Comments: Patient is somnolent but will answer questions appropriately  HENT:     Mouth/Throat:     Mouth: Mucous membranes are dry.  Eyes:     Extraocular Movements: Extraocular movements intact.     Conjunctiva/sclera: Conjunctivae normal.     Pupils: Pupils are equal, round, and reactive to light.  Cardiovascular:     Rate and Rhythm: Normal rate and regular rhythm.     Pulses: Normal pulses.  Pulmonary:     Effort: Pulmonary effort is normal.  Abdominal:     General: There is no distension.     Palpations: Abdomen is soft.     Tenderness: There is abdominal tenderness.     Comments: Minimal abdominal tenderness, generalized on my exam.  There is no guarding or rebound tenderness.  Abdomen is soft  Musculoskeletal:     Cervical back: Normal range of motion. No rigidity.     Comments: Bilateral BKA's and amputation left hand.  Stumps appear well-healed.  No erythema or excessive warmth.  Skin:    General: Skin is warm.     Capillary Refill: Capillary refill takes less than 2 seconds.  Neurological:     Mental Status: She is alert.     Sensory: Sensation is intact.     Comments: Patient appears somnolent although alert and will answer questions appropriately.  Oriented to place and time only on  my exam.  She will follow commands.  No obvious facial weakness or droop.  No upper extremity drift.  Sensation intact     (all labs ordered are listed, but only abnormal results are displayed) Labs Reviewed  LACTIC ACID, PLASMA - Abnormal; Notable for the following components:      Result Value   Lactic Acid, Venous 2.6 (*)    All other components within normal limits  LACTIC ACID, PLASMA - Abnormal; Notable for the following components:   Lactic  Acid, Venous 3.5 (*)    All other components within normal limits  COMPREHENSIVE METABOLIC PANEL WITH GFR - Abnormal; Notable for the following components:   Glucose, Bld 224 (*)    BUN 40 (*)    Creatinine, Ser 4.82 (*)    Total Protein 6.3 (*)    Albumin  3.0 (*)    Alkaline Phosphatase 140 (*)    GFR, Estimated 9 (*)    Anion gap 20 (*)    All other components within normal limits  CBC WITH DIFFERENTIAL/PLATELET - Abnormal; Notable for the following components:   WBC 12.2 (*)    RBC 3.64 (*)    Hemoglobin 8.7 (*)    HCT 29.8 (*)    MCH 23.9 (*)    MCHC 29.2 (*)    RDW 21.4 (*)    nRBC 0.3 (*)    Neutro Abs 10.8 (*)    Lymphs Abs 0.4 (*)    Abs Immature Granulocytes 0.15 (*)    All other components within normal limits  PROTIME-INR - Abnormal; Notable for the following components:   Prothrombin Time 17.5 (*)    INR 1.4 (*)    All other components within normal limits  CBG MONITORING, ED - Abnormal; Notable for the following components:   Glucose-Capillary 219 (*)    All other components within normal limits  CULTURE, BLOOD (ROUTINE X 2)  CULTURE, BLOOD (ROUTINE X 2)    EKG: EKG Interpretation Date/Time:  Sunday March 26 2024 10:21:57 EST Ventricular Rate:  101 PR Interval:  171 QRS Duration:  90 QT Interval:  355 QTC Calculation: 461 R Axis:   24  Text Interpretation: Sinus tachycardia Probable left atrial enlargement Confirmed by Cleotilde Rogue ([id number]) on 03/26/2024 12:31:21 PM  Radiology: CT CHEST ABDOMEN PELVIS  W CONTRAST Result Date: 03/26/2024 EXAM: CT CHEST, ABDOMEN AND PELVIS WITH CONTRAST 03/26/2024 11:54:52 AM TECHNIQUE: CT of the chest, abdomen and pelvis was performed with the administration of 100 mL of iohexol  (OMNIPAQUE ) 300 MG/ML solution. Multiplanar reformatted images are provided for review. Automated exposure control, iterative reconstruction, and/or weight based adjustment of the mA/kV was utilized to reduce the radiation dose to as low as reasonably achievable. COMPARISON: Previous CT from 11/18/2022, 12/19/2022, and 12/16/2023. CLINICAL HISTORY: Sepsis. FINDINGS: CHEST: MEDIASTINUM AND LYMPH NODES: Aortic atherosclerosis and coronary artery calcifications. Mild cardiac enlargement. No pericardial effusion. Aortic and mitral valve calcifications. The central airways are clear. No mediastinal, hilar or axillary lymphadenopathy. LUNGS AND PLEURA: Lungs are hypoinflated. Scarring, architectural distortion and volume loss are again noted within the lung bases, similar to the previous exam. Several small lung nodules are identified appear unchanged from the previous exam. The largest is in the posterior right lower lobe measuring 5 mm, image 82/5. Stable since 11/18/2022. These are compatible with benign nodules. No focal consolidation or pulmonary edema. No pleural effusion. No pneumothorax. ABDOMEN AND PELVIS: LIVER: Geographic focal fatty infiltration deposition is identified within segment 7 and 6 of the inferior right lobe of liver. GALLBLADDER AND BILE DUCTS: Gallbladder appears normal. No biliary ductal dilatation. SPLEEN: No acute abnormality. PANCREAS: Pancreas is normal in size and contour without focal lesion or ductal dilatation. ADRENAL GLANDS: Bilateral adrenal nodules are again noted which have been stable 12/19/2022 compatible with benign adenomas. On the left this measures 2.9 x 1.8 cm, image 52/2. Unchanged. On the right this measures 2.8 x 1.6 cm. No follow-up imaging recommended. KIDNEYS,  URETERS AND BLADDER: Bilateral kidney cysts are again noted. Previously characterized enhancing  lesion of the inferior pole of the right kidney measures 1.9 x 1.6 cm, increased from 1.7 x 1.6 cm previously and remained suspicious for renal cell carcinoma. The remaining cysts have imaging characteristics typical of Bosniak class 1 and 2 kidney lesions. Per consensus, no follow-up is needed for simple Bosniak type 1 and 2 renal cysts, unless the patient has a malignancy history or risk factors. The largest cyst arises from the inferior pole, 3.6 cm. Filling defect within the right renal pelvis appears hyperdense, measuring 6 mm, image 340/4. This is unchanged from the previous exam and is indeterminate but does contain some internal calcifications as demonstrated on the unenhanced CT from 12/16/2023. There is a calcification identified at the left UVJ without significant left-sided hydronephrosis or hydroureter. The calcification is punctate, image 500 35/4 and measures 3 mm. The urinary bladder is decompressed, and there is diffuse circumferential bladder wall thickening with overlying soft tissue stranding. No perinephric or periureteral stranding. GI AND BOWEL: Stomach demonstrates no acute abnormality. The appendix is visualized and appears normal. The colon is largely decompressed with colonic wall thickness measuring up to 5 mm, likely reflecting incomplete distention given the absence of surrounding pericolonic soft tissue stranding/inflammation. Moderate well-formed desiccated stool noted within the rectum. There is no bowel obstruction. REPRODUCTIVE ORGANS: The uterus appears surgically absent. No adnexal mass. PERITONEUM AND RETROPERITONEUM: No ascites. No free air. No focal fluid collections within the abdomen or pelvis. VASCULATURE: Aorta is normal in caliber. Sclerotic and branch vessel disease. ABDOMINAL AND PELVIS LYMPH NODES: No lymphadenopathy. BONES AND SOFT TISSUES: Thoracic spondylosis. Unchanged  anterior superior endplate deformity of T12. Unchanged superior endplate Schmorl's node deformity noted at L4. Focal rim-calcified fat density structure within the ventral abdomen is unchanged and most likely the sequelae of remote fat necrosis. Fat-containing umbilical hernia. Mild diffuse body wall edema. No acute osseous abnormality, other than the described chronic changes. No focal soft tissue abnormality, other than the described findings. IMPRESSION: 1. Diffuse circumferential bladder wall thickening with surrounding stranding and a 3 mm punctate calcification at the left UVJ without significant left hydroureteronephrosis, most consistent with acute cystitis/ureteritis with a distal ureteral stone follow-up urinalysis/culture. 2. Stable 6 mm hyperdense filling defect with internal calcifications in the right renal pelvis, indeterminate (differential includes nonobstructing calculus, sloughed papilla, blood clot, or upper tract urothelial neoplasm). 3. Slight interval increase in size of the enhancing right lower pole renal lesion to 1.9 x 1.6 cm, suspicious for renal cell carcinoma (differential includes oncocytoma and lipid-poor angiomyolipoma); recommend urology consultation and renal protocol MRI (preferred) or CT without and with contrast for staging/characterization and management planning. 4. Aortic atherosclerosis and coronary artery calcifications. Electronically signed by: Waddell Calk MD 03/26/2024 12:19 PM EST RP Workstation: [id number]   CT Head Wo Contrast Result Date: 03/26/2024 EXAM: CT HEAD WITHOUT CONTRAST 03/26/2024 11:54:52 AM TECHNIQUE: CT of the head was performed without the administration of intravenous contrast. Automated exposure control, iterative reconstruction, and/or weight based adjustment of the mA/kV was utilized to reduce the radiation dose to as low as reasonably achievable. COMPARISON: 11/10/2023 CLINICAL HISTORY: Mental status change, unknown cause. FINDINGS: BRAIN AND  VENTRICLES: No acute hemorrhage. No evidence of acute infarct. No hydrocephalus. No extra-axial collection. No mass effect or midline shift. Stable encephalomalacia within the left parieto-occipital region. Atherosclerotic calcifications within the cavernous internal carotid arteries. ORBITS: No acute abnormality. SINUSES: No acute abnormality. SOFT TISSUES AND SKULL: No acute soft tissue abnormality. No skull fracture. Mild opacification of the left mastoid air  cells. IMPRESSION: 1. No acute intracranial abnormality. 2. Stable encephalomalacia in the left parieto-occipital region. 3. Mild left mastoid air cell opacification. Electronically signed by: Toribio Agreste MD 03/26/2024 12:02 PM EST RP Workstation: [id number]   DG Chest Port 1 View Result Date: 03/26/2024 EXAM: 1 VIEW(S) XRAY OF THE CHEST 03/26/2024 10:27:02 AM COMPARISON: 03/15/2024 CLINICAL HISTORY: Questionable sepsis - evaluate for abnormality FINDINGS: LINES, TUBES AND DEVICES: Right IJ central venous catheter in place with tip terminating over the superior cavoatrial junction. LUNGS AND PLEURA: Small left pleural effusion. Mild diffuse interstitial opacities, likely edema. No pneumothorax. HEART AND MEDIASTINUM: Unchanged cardiomegaly. Aortic arch atherosclerosis. BONES AND SOFT TISSUES: No acute osseous abnormality. IMPRESSION: 1. Mild diffuse interstitial opacities, likely edema. 2. Small left pleural effusion. Electronically signed by: Waddell Calk MD 03/26/2024 10:40 AM EST RP Workstation: [id number]     Procedures    CRITICAL CARE Performed by: Sebasthian Stailey Total critical care time: 45 minutes Critical care time was exclusive of separately billable procedures and treating other patients. Critical care was necessary to treat or prevent imminent or life-threatening deterioration. Critical care was time spent personally by me on the following activities: development of treatment plan with patient and/or surrogate as well as nursing,  discussions with consultants, evaluation of patient's response to treatment, examination of patient, obtaining history from patient or surrogate, ordering and performing treatments and interventions, ordering and review of laboratory studies, ordering and review of radiographic studies, pulse oximetry and re-evaluation of patient's condition.  Medications Ordered in the ED  lactated ringers  bolus 1,000 mL (has no administration in time range)                                    Medical Decision Making   Patient brought from home by EMS that was called to scene for altered mental status.  Patient is diabetic, end-stage renal disease on hemodialysis was last dialyzed on Friday.  Family reported patient seemed somnolent and altered today.  Last known well is unclear.  On my exam, she is alert will answer questions appropriately appears to be mentating well I do not appreciate any focal neurodeficit.  Her CBG upon arrival to the ER was 219 and she is febrile rectally at 101.6.  I suspect her altered state is from encephalopathy.  She does complain of some abdominal pain and nausea.  No reported cough.  Patient does appear critically ill and I suspect septic from possible abdominal or chest process.  Family now at bedside, daughter states that she spoke with her mother around 5 AM this morning and she seemed to be at her baseline.  Patient's spouse also at bedside states around 8 AM she seemed to be somewhat confused and slurring her words. Per family, pt does not make urine and her bladder scan here was 0     Amount and/or Complexity of Data Reviewed Labs: ordered.    Details: Labs white count shows mild leukocytosis, hemoglobin 8.7 this is near her baseline.  Lactic acid is elevated and trending upward kidney functions elevated, patient dialysis patient Radiology: ordered.    Details: Chest x-ray with mild diffuse interstitial opacities likely edema there is a small pleural effusion  CT head  without acute intracranial abnormality  CT chest abdomen and pelvis showed bladder wall thickening and stranding with calcification at the left UVJ without hydronephrosis.  There is slight increase in size of a right renal lesion,  suspicious for renal cell carcinoma ECG/medicine tests: ordered.    Details: EKG shows sinus tachycardia probable left atrial enlargement Discussion of management or test interpretation with external provider(s):   Discussed findings with neurology, Dr. Michaela and discussed findings.  Felt that patient change in mental status likely from encephalopathy.  Out of window for TNK no indication for code stroke.  MRI brain reasonable, possible LP if no other source of infection  Patient also seen by Dr. Cleotilde and care plan discussed.    Definitive source of sepsis unclear, possible renal source.  Patient given fluid resuscitation per sepsis protocol antibiotics initiated.  Spoke with [facility] hospitalist, Dr. Bryn.  He request that urology be consulted for possible need for stent placement.  I spoke with Dr. Alvaro with urology, he has reviewed imaging, does not feel that any urologic intervention is needed at this time and the patient appropriate for hospital admission here at [facility].  Discussed with Dr. Bryn again who agrees to admit  Possible urosepsis, CT with possible stone.  Discussed findings with [facility] hospitalist, Dr. Bryn who agrees to admit patient but requesting consultation with urology regarding possible placement of stent and if patient will need to be transferred to [facility] long for further management and possible stent placement  Risk OTC drugs. Prescription drug management. Decision regarding hospitalization.        Final diagnoses:  Sepsis, due to unspecified organism, unspecified whether acute organ dysfunction present [facility])    ED Discharge Orders     None          Herlinda Milling, PA-C 03/26/24 1724  "

## 2024-03-26 NOTE — Progress Notes (Signed)
 eLink Physician-Brief Progress Note Patient Name: TYTIONNA CLOYD DOB: 26-May-1955 MRN: [id number]   Date of Service  03/26/2024  HPI/Events of Note  69 year old female with ESRD, PVD status post bilateral BKA, left hand amputation, heart failure and A-fib seen for sore throat presented again for altered mentation thought to have sepsis secondary to urinary tract infection and encephalopathy complicated by acute respiratory failure with hypoxemia requiring noninvasive ventilation.  Vitals, studies, and imaging reviewed with evidence of fluid overload, kidney injury, chronic anemia and minimal troponin leak.  eICU Interventions  Continue empiric antibiotics with ceftriaxone , follow cultures  Maintain BiPAP therapy for supportive care, HD for volume management. VBG pending  Glycemic control  DVT prophylaxis with SCDs GI prophylaxis not indicated   0308 - Slightly more tachycardic with initiation of HD, GPC noted in blood culture.  Will attempt albumin  25% bolus, maintain norepinephrine , additional amiodarone  bolus, continue the drip.  Echocardiogram already in place.  Question whether the patient will need a line holiday after sufficient ultrafiltration.  Intervention Category Evaluation Type: New Patient Evaluation  Sarahlynn Cisnero 03/26/2024, 10:59 PM

## 2024-03-26 NOTE — Care Plan (Signed)
 Asked to eval for procedural / GU intervention in setting of possible stone in lady who is ESRD anuric.  CT 03/26/24 reviewed.  I see no obstructing stones. Only porcalin renal vesselas and <2cm cysts / low risk renal masses for which no intervention is warranted at this time.No hydro / abscesses.   Pt to stay at [facility] where they have on site dialysis, do not feel transfer to [facility] warranted as they do not have on site hemodialysis and no GU intervention warranted.recommended.

## 2024-03-26 NOTE — ED Triage Notes (Signed)
 Pt [facility] EMS, hx of stroke, on dialysis; pt was evaluated on Friday with no acute findings; provider at bedside during triage.

## 2024-03-26 NOTE — Progress Notes (Signed)
 Pt transported on Bipap to ICU-9 without complications

## 2024-03-26 NOTE — ED Notes (Signed)
 Hall MD notifies of critical troponin of 662.

## 2024-03-27 ENCOUNTER — Inpatient Hospital Stay (HOSPITAL_COMMUNITY)

## 2024-03-27 ENCOUNTER — Inpatient Hospital Stay (HOSPITAL_COMMUNITY)
Admit: 2024-03-27 | Discharge: 2024-03-27 | Disposition: A | Discharge status: Home / Self Care | Attending: Family Medicine | Admitting: Family Medicine

## 2024-03-27 ENCOUNTER — Other Ambulatory Visit (HOSPITAL_COMMUNITY): Payer: Self-pay | Admitting: *Deleted

## 2024-03-27 DIAGNOSIS — G9340 Encephalopathy, unspecified: Secondary | ICD-10-CM | POA: Diagnosis not present

## 2024-03-27 DIAGNOSIS — Z992 Dependence on renal dialysis: Secondary | ICD-10-CM | POA: Diagnosis not present

## 2024-03-27 DIAGNOSIS — R4182 Altered mental status, unspecified: Secondary | ICD-10-CM

## 2024-03-27 DIAGNOSIS — A4101 Sepsis due to Methicillin susceptible Staphylococcus aureus: Secondary | ICD-10-CM | POA: Diagnosis not present

## 2024-03-27 DIAGNOSIS — A419 Sepsis, unspecified organism: Secondary | ICD-10-CM | POA: Diagnosis present

## 2024-03-27 DIAGNOSIS — R7989 Other specified abnormal findings of blood chemistry: Secondary | ICD-10-CM

## 2024-03-27 DIAGNOSIS — N186 End stage renal disease: Secondary | ICD-10-CM | POA: Diagnosis not present

## 2024-03-27 DIAGNOSIS — I4819 Other persistent atrial fibrillation: Secondary | ICD-10-CM

## 2024-03-27 DIAGNOSIS — I5033 Acute on chronic diastolic (congestive) heart failure: Secondary | ICD-10-CM

## 2024-03-27 DIAGNOSIS — I120 Hypertensive chronic kidney disease with stage 5 chronic kidney disease or end stage renal disease: Secondary | ICD-10-CM

## 2024-03-27 DIAGNOSIS — R6521 Severe sepsis with septic shock: Secondary | ICD-10-CM | POA: Diagnosis not present

## 2024-03-27 DIAGNOSIS — R569 Unspecified convulsions: Secondary | ICD-10-CM

## 2024-03-27 DIAGNOSIS — G4733 Obstructive sleep apnea (adult) (pediatric): Secondary | ICD-10-CM

## 2024-03-27 DIAGNOSIS — E1122 Type 2 diabetes mellitus with diabetic chronic kidney disease: Secondary | ICD-10-CM | POA: Diagnosis not present

## 2024-03-27 DIAGNOSIS — I350 Nonrheumatic aortic (valve) stenosis: Secondary | ICD-10-CM | POA: Diagnosis not present

## 2024-03-27 DIAGNOSIS — I4891 Unspecified atrial fibrillation: Secondary | ICD-10-CM

## 2024-03-27 DIAGNOSIS — I10 Essential (primary) hypertension: Secondary | ICD-10-CM | POA: Diagnosis not present

## 2024-03-27 DIAGNOSIS — N390 Urinary tract infection, site not specified: Secondary | ICD-10-CM | POA: Diagnosis not present

## 2024-03-27 LAB — COMPREHENSIVE METABOLIC PANEL WITH GFR
ALT: 20 U/L (ref 0–44)
AST: 32 U/L (ref 15–41)
Albumin: 3 g/dL — ABNORMAL LOW (ref 3.5–5.0)
Alkaline Phosphatase: 122 U/L (ref 38–126)
Anion gap: 24 — ABNORMAL HIGH (ref 5–15)
BUN: 24 mg/dL — ABNORMAL HIGH (ref 8–23)
CO2: 20 mmol/L — ABNORMAL LOW (ref 22–32)
Calcium: 9.5 mg/dL (ref 8.9–10.3)
Chloride: 90 mmol/L — ABNORMAL LOW (ref 98–111)
Creatinine, Ser: 2.86 mg/dL — ABNORMAL HIGH (ref 0.44–1.00)
GFR, Estimated: 17 mL/min — ABNORMAL LOW
Glucose, Bld: 395 mg/dL — ABNORMAL HIGH (ref 70–99)
Potassium: 3.6 mmol/L (ref 3.5–5.1)
Sodium: 133 mmol/L — ABNORMAL LOW (ref 135–145)
Total Bilirubin: <0.2 mg/dL (ref 0.0–1.2)
Total Protein: 6.3 g/dL — ABNORMAL LOW (ref 6.5–8.1)

## 2024-03-27 LAB — RESPIRATORY PANEL BY PCR

## 2024-03-27 LAB — BLOOD CULTURE ID PANEL (REFLEXED) - BCID2

## 2024-03-27 LAB — RENAL FUNCTION PANEL
Albumin: 3.7 g/dL (ref 3.5–5.0)
Anion gap: 15 (ref 5–15)
BUN: 12 mg/dL (ref 8–23)
CO2: 28 mmol/L (ref 22–32)
Calcium: 9.2 mg/dL (ref 8.9–10.3)
Chloride: 95 mmol/L — ABNORMAL LOW (ref 98–111)
Creatinine, Ser: 1.6 mg/dL — ABNORMAL HIGH (ref 0.44–1.00)
GFR, Estimated: 35 mL/min — ABNORMAL LOW
Glucose, Bld: 126 mg/dL — ABNORMAL HIGH (ref 70–99)
Phosphorus: 2 mg/dL — ABNORMAL LOW (ref 2.5–4.6)
Potassium: 3.3 mmol/L — ABNORMAL LOW (ref 3.5–5.1)
Sodium: 138 mmol/L (ref 135–145)

## 2024-03-27 LAB — CBC WITH DIFFERENTIAL/PLATELET
Abs Immature Granulocytes: 0.32 K/uL — ABNORMAL HIGH (ref 0.00–0.07)
Abs Immature Granulocytes: 0.23 K/uL — ABNORMAL HIGH (ref 0.00–0.07)
Basophils Absolute: 0.1 K/uL (ref 0.0–0.1)
Basophils Absolute: 0 K/uL (ref 0.0–0.1)
Basophils Relative: 0 %
Basophils Relative: 0 %
Eosinophils Absolute: 0 K/uL (ref 0.0–0.5)
Eosinophils Absolute: 0 K/uL (ref 0.0–0.5)
Eosinophils Relative: 0 %
Eosinophils Relative: 0 %
HCT: 25.7 % — ABNORMAL LOW (ref 36.0–46.0)
HCT: 23.2 % — ABNORMAL LOW (ref 36.0–46.0)
Hemoglobin: 8 g/dL — ABNORMAL LOW (ref 12.0–15.0)
Hemoglobin: 7.2 g/dL — ABNORMAL LOW (ref 12.0–15.0)
Immature Granulocytes: 2 %
Immature Granulocytes: 2 %
Lymphocytes Relative: 4 %
Lymphocytes Relative: 6 %
Lymphs Abs: 0.8 K/uL (ref 0.7–4.0)
Lymphs Abs: 0.8 K/uL (ref 0.7–4.0)
MCH: 24 pg — ABNORMAL LOW (ref 26.0–34.0)
MCH: 23.6 pg — ABNORMAL LOW (ref 26.0–34.0)
MCHC: 31.1 g/dL (ref 30.0–36.0)
MCHC: 31 g/dL (ref 30.0–36.0)
MCV: 77.2 fL — ABNORMAL LOW (ref 80.0–100.0)
MCV: 76.1 fL — ABNORMAL LOW (ref 80.0–100.0)
Monocytes Absolute: 2.1 K/uL — ABNORMAL HIGH (ref 0.1–1.0)
Monocytes Absolute: 1.7 K/uL — ABNORMAL HIGH (ref 0.1–1.0)
Monocytes Relative: 11 %
Monocytes Relative: 12 %
Neutro Abs: 16.4 K/uL — ABNORMAL HIGH (ref 1.7–7.7)
Neutro Abs: 11.3 K/uL — ABNORMAL HIGH (ref 1.7–7.7)
Neutrophils Relative %: 83 %
Neutrophils Relative %: 80 %
Platelets: 234 K/uL (ref 150–400)
Platelets: 169 K/uL (ref 150–400)
RBC: 3.33 MIL/uL — ABNORMAL LOW (ref 3.87–5.11)
RBC: 3.05 MIL/uL — ABNORMAL LOW (ref 3.87–5.11)
RDW: 21.8 % — ABNORMAL HIGH (ref 11.5–15.5)
RDW: 21.3 % — ABNORMAL HIGH (ref 11.5–15.5)
Smear Review: NORMAL
WBC: 19.7 K/uL — ABNORMAL HIGH (ref 4.0–10.5)
WBC: 14.1 K/uL — ABNORMAL HIGH (ref 4.0–10.5)
nRBC: 0.9 % — ABNORMAL HIGH (ref 0.0–0.2)
nRBC: 0.6 % — ABNORMAL HIGH (ref 0.0–0.2)

## 2024-03-27 LAB — TYPE AND SCREEN
ABO/RH(D): B POS
Antibody Screen: NEGATIVE

## 2024-03-27 LAB — POCT I-STAT 7, (LYTES, BLD GAS, ICA,H+H)
Acid-Base Excess: 1 mmol/L (ref 0.0–2.0)
Bicarbonate: 22.8 mmol/L (ref 20.0–28.0)
Calcium, Ion: 1.24 mmol/L (ref 1.15–1.40)
HCT: 43 % (ref 36.0–46.0)
Hemoglobin: 14.6 g/dL (ref 12.0–15.0)
O2 Saturation: 100 %
Potassium: 3.7 mmol/L (ref 3.5–5.1)
Sodium: 137 mmol/L (ref 135–145)
TCO2: 24 mmol/L (ref 22–32)
pCO2 arterial: 27.2 mmHg — ABNORMAL LOW (ref 32–48)
pH, Arterial: 7.53 — ABNORMAL HIGH (ref 7.35–7.45)
pO2, Arterial: 206 mmHg — ABNORMAL HIGH (ref 83–108)

## 2024-03-27 LAB — GLUCOSE, CAPILLARY
Glucose-Capillary: 123 mg/dL — ABNORMAL HIGH (ref 70–99)
Glucose-Capillary: 146 mg/dL — ABNORMAL HIGH (ref 70–99)
Glucose-Capillary: 147 mg/dL — ABNORMAL HIGH (ref 70–99)
Glucose-Capillary: 148 mg/dL — ABNORMAL HIGH (ref 70–99)
Glucose-Capillary: 154 mg/dL — ABNORMAL HIGH (ref 70–99)
Glucose-Capillary: 162 mg/dL — ABNORMAL HIGH (ref 70–99)
Glucose-Capillary: 174 mg/dL — ABNORMAL HIGH (ref 70–99)

## 2024-03-27 LAB — MAGNESIUM
Magnesium: 2 mg/dL (ref 1.7–2.4)
Magnesium: 1.8 mg/dL (ref 1.7–2.4)

## 2024-03-27 LAB — BLOOD GAS, VENOUS
Acid-Base Excess: 3.9 mmol/L — ABNORMAL HIGH (ref 0.0–2.0)
Acid-Base Excess: 1.3 mmol/L (ref 0.0–2.0)
Bicarbonate: 28.5 mmol/L — ABNORMAL HIGH (ref 20.0–28.0)
Bicarbonate: 25.9 mmol/L (ref 20.0–28.0)
Drawn by: 74115
Drawn by: 66297
O2 Saturation: 43.5 %
O2 Saturation: 80.8 %
Patient temperature: 36.4
Patient temperature: 36.7
pCO2, Ven: 41 mmHg — ABNORMAL LOW (ref 44–60)
pCO2, Ven: 40 mmHg — ABNORMAL LOW (ref 44–60)
pH, Ven: 7.45 — ABNORMAL HIGH (ref 7.25–7.43)
pH, Ven: 7.42 (ref 7.25–7.43)
pO2, Ven: <31 mmHg — CL (ref 32–45)
pO2, Ven: 45 mmHg (ref 32–45)

## 2024-03-27 LAB — D-DIMER, QUANTITATIVE: D-Dimer, Quant: 3.69 ug{FEU}/mL — ABNORMAL HIGH (ref 0.00–0.50)

## 2024-03-27 LAB — PHOSPHORUS: Phosphorus: 3.7 mg/dL (ref 2.5–4.6)

## 2024-03-27 LAB — HEPATITIS B SURFACE ANTIGEN: Hepatitis B Surface Ag: NONREACTIVE

## 2024-03-27 LAB — CG4 I-STAT (LACTIC ACID): Lactic Acid, Venous: 1.8 mmol/L (ref 0.5–1.9)

## 2024-03-27 LAB — PROTIME-INR
INR: 1.4 — ABNORMAL HIGH (ref 0.8–1.2)
Prothrombin Time: 18.1 s — ABNORMAL HIGH (ref 11.4–15.2)

## 2024-03-27 LAB — LIPASE, BLOOD: Lipase: <10 U/L — ABNORMAL LOW (ref 11–51)

## 2024-03-27 LAB — APTT: aPTT: 38 s — ABNORMAL HIGH (ref 24–36)

## 2024-03-27 LAB — PRO BRAIN NATRIURETIC PEPTIDE: Pro Brain Natriuretic Peptide: >35000 pg/mL — ABNORMAL HIGH

## 2024-03-27 MED ORDER — ALTEPLASE 2 MG IJ SOLR: 2.0000 mg | Freq: Once | INTRAMUSCULAR | Status: DC | PRN

## 2024-03-27 MED ORDER — NEPRO/CARBSTEADY PO LIQD: 237.0000 mL | ORAL | Status: DC | PRN

## 2024-03-27 MED ORDER — SUCROFERRIC OXYHYDROXIDE 500 MG PO CHEW
1000.0000 mg | CHEWABLE_TABLET | Freq: Three times a day (TID) | ORAL | Status: DC
  Filled 2024-03-27 (×7): qty 2

## 2024-03-27 MED ORDER — SUCROFERRIC OXYHYDROXIDE 500 MG PO CHEW
500.0000 mg | CHEWABLE_TABLET | ORAL | Status: DC
  Filled 2024-03-27 (×7): qty 1

## 2024-03-27 MED ORDER — PIPERACILLIN-TAZOBACTAM 3.375 G IVPB: 3.3750 g | Freq: Four times a day (QID) | INTRAVENOUS | Status: DC

## 2024-03-27 MED ORDER — AMIODARONE HCL IN DEXTROSE 360-4.14 MG/200ML-% IV SOLN
60.0000 mg/h | INTRAVENOUS | Status: AC
  Administered 2024-03-27: 60 mg/h via INTRAVENOUS

## 2024-03-27 MED ORDER — CALCITRIOL 0.5 MCG PO CAPS
1.0000 ug | ORAL_CAPSULE | ORAL | Status: DC
  Filled 2024-03-27: qty 2

## 2024-03-27 MED ORDER — HEPARIN SODIUM (PORCINE) 1000 UNIT/ML DIALYSIS: 1000.0000 [IU] | INTRAMUSCULAR | Status: DC | PRN

## 2024-03-27 MED ORDER — VANCOMYCIN HCL 750 MG/150ML IV SOLN: 750.0000 mg | INTRAVENOUS | Status: DC

## 2024-03-27 MED ORDER — AMIODARONE HCL IN DEXTROSE 360-4.14 MG/200ML-% IV SOLN
30.0000 mg/h | INTRAVENOUS | Status: DC
  Administered 2024-03-27 – 2024-03-29 (×5): 30 mg/h via INTRAVENOUS
  Filled 2024-03-27 (×5): qty 200

## 2024-03-27 MED ORDER — VANCOMYCIN HCL 1750 MG/350ML IV SOLN
1750.0000 mg | Freq: Once | INTRAVENOUS | Status: AC
  Administered 2024-03-27: 1750 mg via INTRAVENOUS
  Filled 2024-03-27: qty 350

## 2024-03-27 MED ORDER — VANCOMYCIN HCL 1750 MG/350ML IV SOLN
1750.0000 mg | Freq: Once | INTRAVENOUS | Status: DC
  Filled 2024-03-27: qty 350

## 2024-03-27 MED ORDER — CEFAZOLIN SODIUM-DEXTROSE 2-4 GM/100ML-% IV SOLN
2.0000 g | Freq: Once | INTRAVENOUS | Status: AC
  Administered 2024-03-27: 2 g via INTRAVENOUS
  Filled 2024-03-27: qty 100

## 2024-03-27 MED ORDER — POTASSIUM CHLORIDE CRYS ER 20 MEQ PO TBCR: 40.0000 meq | EXTENDED_RELEASE_TABLET | Freq: Two times a day (BID) | ORAL | Status: AC

## 2024-03-27 MED ORDER — MAGNESIUM SULFATE 2 GM/50ML IV SOLN: 2.0000 g | Freq: Once | INTRAVENOUS | Status: DC

## 2024-03-27 MED ORDER — ALBUMIN HUMAN 25 % IV SOLN
25.0000 g | Freq: Once | INTRAVENOUS | Status: AC
  Administered 2024-03-27: 25 g via INTRAVENOUS
  Filled 2024-03-27: qty 100

## 2024-03-27 MED ORDER — METOPROLOL TARTRATE 5 MG/5ML IV SOLN
5.0000 mg | Freq: Once | INTRAVENOUS | Status: AC
  Administered 2024-03-27: 5 mg via INTRAVENOUS
  Filled 2024-03-27: qty 5

## 2024-03-27 MED ORDER — LIDOCAINE-PRILOCAINE 2.5-2.5 % EX CREA: 1.0000 | TOPICAL_CREAM | CUTANEOUS | Status: DC | PRN

## 2024-03-27 MED ORDER — SENNA 8.6 MG PO TABS: 1.0000 | ORAL_TABLET | Freq: Two times a day (BID) | ORAL | Status: DC | PRN

## 2024-03-27 MED ORDER — AMIODARONE HCL IN DEXTROSE 360-4.14 MG/200ML-% IV SOLN
INTRAVENOUS | Status: AC
  Filled 2024-03-27: qty 200

## 2024-03-27 MED ORDER — NOREPINEPHRINE 4 MG/250ML-% IV SOLN
0.0000 ug/min | INTRAVENOUS | Status: DC
  Administered 2024-03-27: 2 ug/min via INTRAVENOUS
  Administered 2024-03-28: 4 ug/min via INTRAVENOUS
  Administered 2024-03-28: 5 ug/min via INTRAVENOUS
  Filled 2024-03-27 (×3): qty 250

## 2024-03-27 MED ORDER — LIDOCAINE HCL (PF) 1 % IJ SOLN
INTRAMUSCULAR | Status: AC
  Filled 2024-03-27: qty 5

## 2024-03-27 MED ORDER — HEPARIN SODIUM (PORCINE) 5000 UNIT/ML IJ SOLN
5000.0000 [IU] | Freq: Three times a day (TID) | INTRAMUSCULAR | Status: DC
  Administered 2024-03-27 (×2): 5000 [IU] via SUBCUTANEOUS
  Filled 2024-03-27 (×2): qty 1

## 2024-03-27 MED ORDER — HEPARIN SODIUM (PORCINE) 1000 UNIT/ML IJ SOLN
INTRAMUSCULAR | Status: AC
  Filled 2024-03-27: qty 4

## 2024-03-27 MED ORDER — POLYETHYLENE GLYCOL 3350 17 G PO PACK: 17.0000 g | PACK | Freq: Every day | ORAL | Status: DC | PRN

## 2024-03-27 MED ORDER — SODIUM CHLORIDE 0.9 % IV SOLN: 250.0000 mL | INTRAVENOUS | Status: AC

## 2024-03-27 MED ORDER — ANTICOAGULANT SODIUM CITRATE 4% (200MG/5ML) IV SOLN: 5.0000 mL | Status: DC | PRN

## 2024-03-27 MED ORDER — CEFAZOLIN SODIUM-DEXTROSE 1-4 GM/50ML-% IV SOLN: 1.0000 g | INTRAVENOUS | Status: DC

## 2024-03-27 MED ORDER — HEPARIN SODIUM (PORCINE) 5000 UNIT/ML IJ SOLN
5000.0000 [IU] | Freq: Three times a day (TID) | INTRAMUSCULAR | Status: DC
  Administered 2024-03-27 – 2024-03-28 (×3): 5000 [IU] via SUBCUTANEOUS
  Filled 2024-03-27 (×3): qty 1

## 2024-03-27 MED ORDER — CINACALCET HCL 30 MG PO TABS: 90.0000 mg | ORAL_TABLET | ORAL | Status: DC

## 2024-03-27 MED ORDER — AMIODARONE IV BOLUS ONLY 150 MG/100ML
150.0000 mg | Freq: Once | INTRAVENOUS | Status: AC
  Administered 2024-03-27: 150 mg via INTRAVENOUS
  Filled 2024-03-27: qty 100

## 2024-03-27 MED ORDER — SODIUM CHLORIDE 0.9 % IV SOLN
1.0000 g | INTRAVENOUS | Status: DC
  Administered 2024-03-27: 1 g via INTRAVENOUS
  Filled 2024-03-27: qty 20

## 2024-03-27 MED ORDER — LIDOCAINE HCL (PF) 1 % IJ SOLN: 5.0000 mL | INTRAMUSCULAR | Status: DC | PRN

## 2024-03-27 MED ORDER — LIDOCAINE HCL (PF) 1 % IJ SOLN
5.0000 mL | Freq: Once | INTRAMUSCULAR | Status: AC
  Administered 2024-03-27: 5 mL

## 2024-03-27 MED ORDER — PENTAFLUOROPROP-TETRAFLUOROETH EX AERO: 1.0000 | INHALATION_SPRAY | CUTANEOUS | Status: DC | PRN

## 2024-03-27 NOTE — Progress Notes (Signed)
 Patient started on Amiodarone  gtt per ICU Nurse

## 2024-03-27 NOTE — Progress Notes (Addendum)
"                   Overnight cross coverage [facility], hospitalist service.  Notified regarding the patient's persistent A-fib with RVR.  Received amiodarone  bolus and 25 g of IV albumin  ordered by CCM E-link.    Returned to bedside.  Due to persistent A-fib with RVR, rates in the 150s, added IV Lopressor  5 mg x 1.  Heart rate is now improved in the 100s to 110s.  The patient is on BiPAP with ongoing hemodialysis.  Plan is to remove 2-1/2 L of fluid.  Somnolent.  Faint rales noted on lungs auscultation bilaterally.  Bilateral lower extremity below the knee amputation with trace edema.  VBG ordered to be repeated.    She is currently on amiodarone  drip and Levophed .  Updated the patient's husband at bedside.  Critical care time: 35 minutes. "

## 2024-03-27 NOTE — TOC Initial Note (Signed)
 Transition of Care [facility]) - Initial/Assessment Note    Patient Details  Name: Kathryn Beck MRN: [id number] Date of Birth: [birth year]  Transition of Care [facility]) CM/SW Contact:    Lucie Lunger, LCSWA Phone Number: 03/27/2024, 9:51 AM  Clinical Narrative:                 Pt is high risk for readmission. CSW spoke with pts spouse to complete assessment. Pt and spouse live together. Pts spouse assists her with completing her ADLs. Pt has transportation provided through [employer] or family. Pt has not had HH in the past per spouse. Pt uses a wheelchair. Pt goes to HD MWF. TOC to follow.   Expected Discharge Plan: Home/Self Care Barriers to Discharge: Continued Medical Work up   Patient Goals and CMS Choice Patient states their goals for this hospitalization and ongoing recovery are:: get better CMS Medicare.gov Compare Post Acute Care list provided to:: Patient Represenative (must comment) Choice offered to / list presented to : Spouse      Expected Discharge Plan and Services In-house Referral: Clinical Social Work Discharge Planning Services: CM Consult   Living arrangements for the past 2 months: Single Family Home                                      Prior Living Arrangements/Services Living arrangements for the past 2 months: Single Family Home Lives with:: Spouse Patient language and need for interpreter reviewed:: Yes Do you feel safe going back to the place where you live?: Yes      Need for Family Participation in Patient Care: Yes (Comment) Care giver support system in place?: Yes (comment) Current home services: DME Criminal Activity/Legal Involvement Pertinent to Current Situation/Hospitalization: No - Comment as needed  Activities of Daily Living   ADL Screening (condition at time of admission) Independently performs ADLs?: No Does the patient have a NEW difficulty with bathing/dressing/toileting/self-feeding that is expected to last >3 days?: Yes  (Initiates electronic notice to provider for possible OT consult) Does the patient have a NEW difficulty with getting in/out of bed, walking, or climbing stairs that is expected to last >3 days?: Yes (Initiates electronic notice to provider for possible PT consult) Does the patient have a NEW difficulty with communication that is expected to last >3 days?: No Is the patient deaf or have difficulty hearing?: No Does the patient have difficulty seeing, even when wearing glasses/contacts?: No Does the patient have difficulty concentrating, remembering, or making decisions?: No  Permission Sought/Granted                  Emotional Assessment Appearance:: Appears stated age Attitude/Demeanor/Rapport: Engaged Affect (typically observed): Accepting   Alcohol  / Substance Use: Not Applicable Psych Involvement: No (comment)  Admission diagnosis:  Sepsis due to urinary tract infection (HCC) [A41.9, N39.0] Sepsis, due to unspecified organism, unspecified whether acute organ dysfunction present [facility]) [A41.9] Patient Active Problem List   Diagnosis Date Noted   Sepsis due to urinary tract infection (HCC) 03/26/2024   Gangrene of hand (HCC) 02/02/2024   Hyperkalemia 02/02/2024   Melena 01/20/2024   Anemia due to chronic blood loss 01/17/2024   S/P bilateral BKA (below knee amputation) (HCC) 01/17/2024   Acquired thrombophilia 01/17/2024   Rectal bleeding 12/29/2023   Lower GI bleed 12/16/2023   Dry Gangrene of left upper extremity 12/06/2023   History of GI bleeding 12/01/2023  Dry gangrene (HCC) - left hand 10/29/2023   PAF (paroxysmal atrial fibrillation) (HCC) 10/27/2023   Mass of breast 08/30/2023   Depression, major, single episode, severe (HCC) 08/30/2023   GAD (generalized anxiety disorder) 08/30/2023   Decreased strength, endurance, and mobility 08/30/2023   Gangrene of finger of left hand (HCC) 08/30/2023   Type 2 diabetes mellitus with other specified complication (HCC)  08/30/2023   Hearing loss 04/27/2023   Otalgia of both ears 04/27/2023   Generalized weakness 11/18/2022   Paronychia of finger of right hand 11/13/2022   Status post below-knee amputation of left lower extremity (HCC) 10/07/2022   Local infection of the skin and subcutaneous tissue, unspecified 09/08/2022   Polyneuropathy, unspecified 09/08/2022   Left foot infection 09/03/2022   Wound infection after surgery 09/03/2022   Cognitive communication deficit 08/21/2022   Other abnormalities of gait and mobility 08/21/2022   Dry eye syndrome of bilateral lacrimal glands 08/20/2022   Pruritus 08/20/2022   Peripheral vascular complication 07/21/2022   PVD (peripheral vascular disease) 07/18/2022   Personal history of transient ischemic attack (TIA), and cerebral infarction without residual deficits 06/10/2022   Repeated falls 06/10/2022   Unspecified osteoarthritis, unspecified site 06/10/2022   S/P BKA (below knee amputation), right (HCC) 05/29/2022   Gangrene of right foot (HCC) 04/25/2022   PAD (peripheral artery disease) 04/25/2022   Other osteoporosis without current pathological fracture 11/10/2021   Hypocalcemia 11/10/2021   Low back pain with left-sided sciatica 10/31/2021   Left ear impacted cerumen 09/18/2021   Insomnia 10/28/2020   Memory loss or impairment 09/26/2020   Right leg weakness 02/28/2020   Knee pain, right 10/23/2019   Carpal tunnel syndrome 06/13/2019   Chronic pain syndrome 06/13/2019   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) 06/13/2019   ESRD on hemodialysis (HCC) 11/22/2018   Ischemic heart disease 09/25/2018   HTN (hypertension) 06/12/2018   Adrenal mass, left 11/09/2017   MGUS (monoclonal gammopathy of unknown significance) 11/05/2017   Chronic diastolic (congestive) heart failure (HCC) 09/20/2017   Adrenal adenoma 09/03/2017   Dysphagia 11/05/2016   Irritable bowel syndrome 02/04/2016   Cardiac murmur 01/17/2014   Back pain with  left-sided radiculopathy 09/18/2013   Other seasonal allergic rhinitis 08/22/2012   OSA (obstructive sleep apnea) 06/02/2011   Neuropathy 04/16/2010   LUPUS ERYTHEMATOSUS, DISCOID 06/05/2008   Hyperlipidemia 06/07/2007   Obesity 06/07/2007   Diabetes mellitus (HCC) 06/07/2007   Neuropathy due to type 2 diabetes mellitus (HCC) 06/07/2007   PCP:  Antonetta Rollene BRAVO, MD Pharmacy:   [facility] - [place], [place] - [address] [place] [address] Phone: [phone] Fax: [phone]  [facility] [address] [place] [address] Phone: [phone] Fax: [phone]     Social Drivers of Health (SDOH) Social History: SDOH Screenings   Food Insecurity: No Food Insecurity (03/26/2024)  Housing: Low Risk (03/26/2024)  Transportation Needs: No Transportation Needs (03/26/2024)  Utilities: Not At Risk (03/26/2024)  Alcohol  Screen: Low Risk (05/03/2023)  Depression (PHQ2-9): High Risk (02/22/2024)  Financial Resource Strain: Low Risk (02/22/2024)  Physical Activity: Inactive (02/22/2024)  Social Connections: Moderately Integrated (03/26/2024)  Stress: Stress Concern Present (02/22/2024)  Tobacco Use: Low Risk (03/26/2024)  Health Literacy: Adequate Health Literacy (05/03/2023)  Recent Concern: Health Literacy - Inadequate Health Literacy (02/08/2023)   SDOH Interventions:     Readmission Risk Interventions    03/27/2024    9:49 AM 01/02/2024   12:17 PM  12/16/2023    3:19 PM  Readmission Risk Prevention Plan  Transportation Screening Complete Complete Complete  Medication Review [employer]) Complete Complete Complete  HRI or Home Care Consult Complete Complete Complete  SW Recovery Care/Counseling Consult Complete Complete Complete  Palliative Care Screening Not Applicable Not Applicable Not Applicable  Skilled Nursing Facility Not Applicable Not Applicable Not Applicable

## 2024-03-27 NOTE — Consult Note (Signed)
 "  Cardiology Consultation   Patient ID: SANDRALEE Beck MRN: [id number]; DOB: 1955-05-04  Admit date: 03/26/2024 Date of Consult: 03/27/2024  PCP:  Antonetta Rollene BRAVO, MD   [facility] Providers Cardiologist:  Jayson Sierras, MD     Patient Profile: Kathryn Beck is a 69 y.o. female with a hx of  PAD (s/p R BKA in 05/2022, prior lithotripsy and stenting of the left superficial femoral and popliteal artery in 06/2022 and ultimately L BKA in 09/2022), HTN, HLD, Type 2 DM, bilateral adrenal adenoma (followed by Endocrinology), chronic HFpEF, aortic stenosis, OSA, ESRD (HD MWF, complete Tx 1/2, history of TIA's and history of chest pain, paroxysmal atrial fibrillation s/p DCCV in 10/2023 who is being seen 03/27/2024 for the evaluation of  at the request of  Dr. Bryn.  History of Present Illness: Ms. Carnegie was the following pertinent cardiac history:  Cath in 12/2018 showing normal coronary arteries Echocardiogram in 05/2023: preserved EF of 55% with grade 1 diastolic dysfunction, moderate aortic stenosis, severe dilatation of her left atria   Last seen by heart care 10/2023 in hospital consult for new atrial fibrillation.  He underwent DCCV by ER given hypotension to NSR.  Afterwards, telemetry showed NSR with only brief episodes of tachycardia for less than 6 seconds consistent with SVT that spontaneously.  Discontinued Plavix  due to concern for increased bleeding with triple therapy.  Transitioned from heparin  to Eliquis  5 mg twice daily. Continued on ASA 81 mg daily, amlodipine  5 mg daily, Eliquis  5 mg twice daily, Zetia  10 mg daily, metoprolol  XL 50 mg daily, torsemide  on T/TH/S/S  Presented to [facility] ED 1/4 for AMS.  Admitted for sepsis due to UTI with organ dysfunction, acute metabolic encephalopathy, and acute respiratory distress requiring BiPaP.  Initial EKG: Sinus Tachycardia, HR 101 with no acute changes Lactic acid 2.6 > 3.5 despite 2L IVF. WBC 12.2k with left shift, hgb  8.7g/dl, TN  404>337>382, K 4.1 > 3.3, Mg 1.7 CXR: interstitial opacities and a small right pleural effusion.  CT C/A/P: bladder wall thickening with fat stranding, possible distal left UVJ stone  ECHO pending.  Cardio consulted due to patient developing persistent A-fib with RVR on 1/4 around 1 am. EKG showed afib with RVR , HR 130's. No improvement with IV Lopressor  x1 . Started on IV amio.  Also consults pending for infectious disease and neurology.   On interview, history obtained from daughter at bedside due to patient on BiPAP and unresponsive.  Per daughter, reported that around 8 AM Sunday morning patient was found by husband slumped over with strokelike symptoms and making gurgling noise.  Noted that SOB started Sunday morning.  Patient was last seen normal early Sunday morning around 6 AM.  Noted that on Saturday patient was her normal self with only complaint of body aches and sore throat.  Patient is normally verbally responsive.  Patient is normally able to use electrical wheelchair around the house without any concerns.  Patient is supposed to wear CPAP at night but does not wear regularly.  Notes compliance with medications. She has not missed any dialysis.   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  Last Assessment & Plan:   Multiple polyps with surveillance due in 2021.  IMO SNOMED Dx Update Oct 2024     History of pneumonia 2019   Hypoxia 04/03/2019   Memory loss    Mixed hyperlipidemia    Not currently working due to disabled status 06/13/2018   Formatting of this note might be different from the original.  Last Assessment & Plan:   Formatting of this note might be different from the original.  Reports being  disabled since April/May 2019 , hospitalized then in a niursing home, now on dialysis and is unable to work     Obesity    Pancreatitis    Peritoneal dialysis catheter in place    Pneumonia due to COVID-19 virus 04/02/2019   Sleep apnea    Noncompliant with CPAP   Stroke (HCC)    mini stroke   Type 2 diabetes mellitus (HCC)     Past Surgical History:  Procedure Laterality Date   ABDOMINAL AORTOGRAM W/LOWER EXTREMITY N/A 04/30/2022   Procedure: ABDOMINAL AORTOGRAM W/LOWER EXTREMITY;  Surgeon: Magda Debby SAILOR, MD;  [facility]: [facility];  Service: Cardiovascular;  Laterality: N/A;   ABDOMINAL AORTOGRAM W/LOWER EXTREMITY N/A 07/21/2022   Procedure: ABDOMINAL AORTOGRAM W/LOWER EXTREMITY;  Surgeon: Serene Gaile ORN, MD;  Location: [facility];  Service: Cardiovascular;  Laterality: N/A;   ABDOMINAL HYSTERECTOMY     ACHILLES TENDON LENGTHENING  08/15/2022   Procedure: ACHILLES TENDON LENGTHENING;  Surgeon: Silva Juliene SAUNDERS, DPM;  Location: [facility] OR;  Service: Podiatry;;   AMPUTATION Right 05/29/2022   Procedure: RIGHT BELOW THE KNEE AMPUTATION;  Surgeon: Harden Jerona GAILS, MD;  Location[facility] OR;  Service: Orthopedics;  Laterality: Right;   AMPUTATION Left 09/04/2022   Procedure: AMPUTATION FOOT, serial irrigation;  Surgeon: Joya Stabs, DPM;  Location: [facility] OR;  Service: Podiatry;  Laterality: Left;  Surgical team to do block   AMPUTATION Left  10/07/2022   Procedure: LEFT BELOW KNEE AMPUTATION;  Surgeon: Harden Jerona GAILS, MD;  Location: [facility] OR;  Service: Orthopedics;  Laterality: Left;   AMPUTATION Left 02/03/2024   Procedure: AMPUTATION, HAND;  Surgeon: Arlinda Buster, MD;  Location: [facility] OR;  Service: Orthopedics;  Laterality: Left;  LEFT HAND AMPUTATION   AMPUTATION FINGER Left 09/29/2023   Procedure: AMPUTATION, FINGER;  Surgeon: Harden Jerona GAILS, MD;  Location: [facility] OR;  Service: Orthopedics;  Laterality: Left;  LEFT HAND LONG FINGER RAY AMPUTATION   AV FISTULA PLACEMENT Left 09/02/2017   Procedure: creation of left arm ARTERIOVENOUS (AV) FISTULA;  Surgeon: Serene Gaile ORN, MD;  Location: [facility] OR;  Service: Vascular;  Laterality: Left;   COLONOSCOPY  2008   Dr. Harvey: normal    COLONOSCOPY N/A 12/18/2016   Dr. Harvey: multiple tubular adenomas, internal hemorrhoids. Surveillance in 3 years    COLONOSCOPY N/A 12/05/2023  Procedure: COLONOSCOPY;  Surgeon: Cindie Carlin POUR, DO;  [facility]: [facility];  Service: Endoscopy;  Laterality: N/A;   COLONOSCOPY N/A 12/17/2023   Procedure: COLONOSCOPY;  Surgeon: Shaaron Lamar HERO, MD;  Location: [facility];  Service: Endoscopy;  Laterality: N/A;   COLONOSCOPY N/A 01/01/2024   Procedure: COLONOSCOPY;  Surgeon: Cindie Carlin POUR, DO;  [facility];  Service: Endoscopy;  Laterality: N/A;   DIALYSIS/PERMA CATHETER INSERTION N/A 03/17/2024   Procedure: DIALYSIS/PERMA CATHETER INSERTION;  Surgeon: Magda Debby SAILOR, MD;  [facility];  Service: Cardiovascular;  Laterality: N/A;   ESOPHAGEAL DILATION N/A 10/13/2015   Procedure: ESOPHAGEAL DILATION;  Surgeon: Claudis RAYMOND Rivet, MD;  Location: [facility];  Service: Endoscopy;  Laterality: N/A;   ESOPHAGEAL DILATION N/A 12/03/2023   Procedure: DILATION, ESOPHAGUS;  Surgeon: Cindie Carlin POUR, DO;  [facility]: [facility];  Service: Endoscopy;  Laterality: N/A;   ESOPHAGOGASTRODUODENOSCOPY N/A 10/13/2015   Dr. Rivet: chronic  gastritis on path, no H.pylori. Empiric dilation    ESOPHAGOGASTRODUODENOSCOPY N/A 12/18/2016   Dr. Harvey: mild gastritis. BRAVO study revealed uncontrolled GERD. Dysphagia secondary to uncontrolled reflux   ESOPHAGOGASTRODUODENOSCOPY N/A 12/03/2023   Procedure: EGD (ESOPHAGOGASTRODUODENOSCOPY);  Surgeon: Cindie Carlin POUR, DO;  [facility];  Service: Endoscopy;  Laterality: N/A;   FOOT SURGERY Bilateral    nerve     INCISION AND DRAINAGE OF WOUND Left 10/30/2023   Procedure: IRRIGATION AND DEBRIDEMENT WOUND;  Surgeon: Arlinda Buster, MD;  Location: [facility];  Service: Orthopedics;  Laterality: Left;  LEFT HAND WOUND   INSERTION OF DIALYSIS CATHETER N/A 02/03/2024   Procedure: INSERTION OF DIALYSIS CATHETER;  Surgeon: Magda Debby SAILOR, MD;  Location: [facility];  Service: Vascular;  Laterality: N/A;   LEFT HEART CATH AND CORONARY ANGIOGRAPHY N/A 12/29/2018   Procedure: LEFT HEART CATH AND CORONARY ANGIOGRAPHY;  Surgeon: Dann Candyce RAMAN, MD;  Location: [facility] INVASIVE CV LAB;  Service: Cardiovascular;  Laterality: N/A;   LIGATION OF ARTERIOVENOUS  FISTULA Left 02/03/2024   Procedure: LIGATION OF ARTERIOVENOUS  FISTULA;  Surgeon: Magda Debby SAILOR, MD;  Location: [facility] OR;  Service: Vascular;  Laterality: Left;   LOWER EXTREMITY ANGIOGRAPHY Right 05/04/2022   Procedure: Lower Extremity Angiography;  Surgeon: Lanis Fonda BRAVO, MD;  Location: [facility] INVASIVE CV LAB;  Service: Cardiovascular;  Laterality: Right;   LUNG BIOPSY     MASS EXCISION Right 01/09/2013   Procedure: EXCISION OF NEOPLASM OF RIGHT  AXILLA  AND EXCISION OF NEOPLASM OF LEFT AXILLA;  Surgeon: Oneil DELENA Budge, MD;  Location: [facility];  Service: General;  Laterality: Right;  procedure end @ 08:23   MYRINGOTOMY WITH TUBE PLACEMENT Bilateral 04/28/2017   Procedure: BILATERAL MYRINGOTOMY WITH TUBE PLACEMENT;  Surgeon: Karis Clunes, MD;  Location: [facility];  Service: ENT;  Laterality: Bilateral;   PERIPHERAL VASCULAR BALLOON ANGIOPLASTY Right 05/04/2022    Procedure: PERIPHERAL VASCULAR BALLOON ANGIOPLASTY;  Surgeon: Lanis Fonda BRAVO, MD;  Location: [facility] INVASIVE CV LAB;  Service: Cardiovascular;  Laterality: Right;  PT   PERIPHERAL VASCULAR INTERVENTION Right 05/04/2022   Procedure: PERIPHERAL VASCULAR INTERVENTION;  Surgeon: Lanis Fonda BRAVO, MD;  Location: [facility] INVASIVE CV LAB;  Service: Cardiovascular;  Laterality: Right;  SFA   PERIPHERAL VASCULAR INTERVENTION Left 07/21/2022   Procedure: PERIPHERAL VASCULAR INTERVENTION;  Surgeon: Serene Gaile ORN, MD;  [facility];  Service: Cardiovascular;  Laterality: Left;   REVISION OF ARTERIOVENOUS GORETEX GRAFT Left 05/04/2018   Procedure: TRANSPOSITION OF CEPHALIC  VEIN ARTERIOVENOUS FISTULA LEFT ARM;  Surgeon: Oris Krystal FALCON, MD;  Location: [facility] OR;  Service: Vascular;  Laterality: Left;   SAVORY DILATION N/A 12/18/2016   Procedure: SAVORY DILATION;  Surgeon: Harvey Margo CROME, MD;  [facility];  Service: Endoscopy;  Laterality: N/A;   TRANSMETATARSAL AMPUTATION Left 08/15/2022   Procedure: TRANSMETATARSAL AMPUTATION;  Surgeon: Silva Juliene SAUNDERS, DPM;  Location: [facility] OR;  Service: Podiatry;  Laterality: Left;     Home Medications:  Prior to Admission medications  Medication Sig Start Date End Date Taking? Authorizing Provider  acetaminophen  (TYLENOL ) 500 MG tablet Take 1,000 mg by mouth every 6 (six) hours as needed for mild pain (pain score 1-3).    [provider]  amLODipine  (NORVASC ) 10 MG tablet Take 0.5 tablets (5 mg total) by mouth daily. 10/28/23   Pearlean Manus, MD  ASPIRIN  LOW DOSE 81 MG chewable tablet Chew 162 mg by mouth daily. 01/05/24   [provider]  B Complex-C-Folic Acid  (RENA-VITE RX) 1 MG TABS Take 1 tablet by mouth daily. 04/15/22   [provider]  busPIRone  (BUSPAR ) 7.5 MG tablet Take 1 tablet (7.5 mg total) by mouth 3 (three) times daily. 02/22/24   Antonetta Rollene BRAVO, MD  calcitRIOL  (ROCALTROL ) 0.5 MCG capsule Take 2 capsules (1 mcg  total) by mouth every Monday, Wednesday, and Friday with hemodialysis. 11/05/23   Caleen Burgess BROCKS, MD  cinacalcet  (SENSIPAR ) 30 MG tablet Take 3 tablets (90 mg total) by mouth every Monday, Wednesday, and Friday. 12/08/23   Johnson, Clanford L, MD  ezetimibe  (ZETIA ) 10 MG tablet Take 1 tablet (10 mg total) by mouth daily. 04/27/23   Antonetta Rollene BRAVO, MD  insulin  aspart (NOVOLOG ) 100 UNIT/ML injection Inject 0-6 Units into the skin 3 (three) times daily with meals. CBG 70 - 120: 0 units  CBG 121 - 150: 0 units  CBG 151 - 200: 1 unit  CBG 201-250: 2 units  CBG 251-300: 3 units  CBG 301-350: 4 units  CBG 351-400: 5 units  CBG > 400: Give 10 units and call MD 11/12/23   Willette Adriana LABOR, MD  insulin  glargine, 2 Unit Dial , (TOUJEO  MAX SOLOSTAR) 300 UNIT/ML Solostar Pen Inject 30 Units into the skin daily. May need to slowly increase the dose depending upon your blood sugar, follow-up with PCP 01/01/23   Trixie File, MD  metoprolol  succinate (TOPROL -XL) 50 MG 24 hr tablet TAKE ONE TABLET BY MOUTH ONCE DAILY WITH FOOD (TAKE AFTER DIALYSIS) 12/20/23   Antonetta Rollene BRAVO, MD  mirtazapine  (REMERON ) 7.5 MG tablet Take 1 tablet (7.5 mg total) by mouth at bedtime. 03/06/24   Antonetta Rollene BRAVO, MD  ondansetron  (ZOFRAN ) 4 MG tablet Take 4 mg by mouth every 8 (eight) hours as needed for nausea or vomiting. 11/26/23   [provider]  pantoprazole  (PROTONIX ) 40 MG tablet TAKE ONE TABLET BY MOUTH EVERY DAY 03/01/24   Antonetta Rollene BRAVO, MD  pregabalin  (LYRICA ) 50 MG capsule Take 1 capsule (50 mg total) by mouth 3 (three) times daily. 03/03/24   Antonetta Rollene BRAVO, MD  senna (SENOKOT) 8.6 MG TABS tablet Take 1 tablet (8.6 mg total) by mouth daily as needed for mild constipation or moderate constipation. 02/06/24   Arlice Reichert, MD  sertraline  (ZOLOFT ) 100 MG tablet Take 1 tablet (100 mg total) by mouth daily. 02/22/24   Antonetta Rollene BRAVO, MD  [name]  500 MG chewable tablet Chew 1-2 tablets  (500-1,000 mg total) by mouth See admin instructions.  Take 1000mg  (2 tablets) by mouth with meals and 500mg  (1 tablet) with snacks 08/23/23   Antonetta Rollene BRAVO, MD  FLUoxetine (PROZAC) 10 MG capsule Take 10 mg by mouth daily.    05/28/11  [provider]  glipiZIDE  (GLUCOTROL ) 10 MG tablet Take 10 mg by mouth 2 (two) times daily before a meal.    05/28/11  [provider]    Scheduled Meds:  Chlorhexidine  Gluconate Cloth  6 each Topical Q0600   Chlorhexidine  Gluconate Cloth  6 each Topical Q0600   heparin   5,000 Units Subcutaneous Q8H   insulin  aspart  0-9 Units Subcutaneous Q4H   Continuous Infusions:  sodium chloride      amiodarone  30 mg/hr (03/27/24 0750)   amiodarone      anticoagulant sodium citrate      cefTRIAXone  (ROCEPHIN )  IV     norepinephrine  (LEVOPHED ) Adult infusion 5 mcg/min (03/27/24 0720)   vancomycin      [START ON 04/16/2024] vancomycin      PRN Meds: acetaminophen , alteplase , amiodarone , anticoagulant sodium citrate , feeding supplement (NEPRO CARB STEADY), heparin , lidocaine  (PF), lidocaine -prilocaine , melatonin, pentafluoroprop-tetrafluoroeth, polyethylene glycol, prochlorperazine   Allergies:   Allergies[1]  Social History:   Social History   Socioeconomic History   Marital status: Married    Spouse name: Scarlettrose Costilow   Number of children: 2   Years of education: 12   Highest education level: Associate degree: occupational, scientist, product/process development, or vocational program  Occupational History   Occupation: retired   Tobacco Use   Smoking status: Never    Passive exposure: Never   Smokeless tobacco: Never   Tobacco comments:    Verified by Daughter, [name]  Vaping Use   Vaping status: Never Used  Substance and Sexual Activity   Alcohol  use: No   Drug use: No   Sexual activity: Not Currently    Partners: Male  Other Topics Concern   Not on file  Social History Narrative   Lives alone with husband    Right handed   Caffeine-1/2 daily    Family History:   Family History  Problem Relation Age of Onset   Hypertension Father    Hypercholesterolemia Father    Arthritis Father    Hypertension Sister    Hypercholesterolemia Sister    Breast cancer Sister    Hypertension Sister    Colon cancer Neg Hx    Colon polyps Neg Hx      ROS:  Please see the history of present illness.  All other ROS reviewed and negative.     Physical Exam/Data: Vitals:   03/27/24 0845 03/27/24 0900 03/27/24 0915 03/27/24 0930  BP: (!) 115/56  (!) 120/98 (!) 120/92  Pulse:      Resp: (!) 21 19    Temp:      TempSrc:      SpO2:      Weight:      Height:        Intake/Output Summary (Last 24 hours) at 03/27/2024 1049 Last data filed at 03/27/2024 0810 Gross per 24 hour  Intake 2430.23 ml  Output 2300 ml  Net 130.23 ml      03/27/2024    8:10 AM 03/27/2024    1:30 AM 03/26/2024    8:00 PM  Last 3 Weights  Weight (lbs) 151 lb 10.8 oz 156 lb 12 oz 156 lb 12 oz  Weight (kg) 68.8 kg 71.1 kg 71.1 kg     Body mass index is 26.04 kg/m.  General:  in no acute distress;  accompanied by family wearing CPAP; non verbal  HEENT: normal Neck: no JVD Vascular: No carotid bruits; Distal pulses 2+ bilaterally Cardiac:  normal S1, S2; RRR; 2/6 systolic murmur in RUSB/LUSB Lungs: no able to assess.  Abd: soft, nontender, no hepatomegaly  Ext: BKA with no edema Musculoskeletal:  No deformities, BUE and BLE strength normal and equal Skin: warm and dry  Neuro:  CNs 2-12 intact, no focal abnormalities noted Psych:  Normal affect   EKG:  The EKG was personally reviewed and demonstrates:  afib with RVR , HR 130's. Telemetry:  Telemetry was personally reviewed and demonstrates:  Converted to A-fib with RVR on 1/4 around 1 am with HR up to 140's and most recent HR 100-120's.   Relevant CV Studies: Cath 2020 Large, widely patent coronary arteries. LV end diastolic pressure is mildly elevated. There is no aortic valve stenosis.   No significant CAD.   Continue medical therapy. Diagnostic Dominance: Right    ECHO 05/2023 IMPRESSIONS   1. Left ventricular ejection fraction, by estimation, is 55%. The left  ventricle has normal function. The left ventricle has no regional wall  motion abnormalities. There is mild left ventricular hypertrophy. Left  ventricular diastolic parameters are  consistent with Grade I diastolic dysfunction (impaired relaxation).  Elevated left ventricular end-diastolic pressure.   2. Right ventricular systolic function is normal. The right ventricular  size is normal. Tricuspid regurgitation signal is inadequate for assessing  PA pressure.   3. Left atrial size was severely dilated.   4. The mitral valve is abnormal. Trivial mitral valve regurgitation. Mild  mitral stenosis. Severe mitral annular calcification.   5. The aortic valve has an indeterminant number of cusps. Aortic valve  regurgitation is not visualized. Moderate aortic valve stenosis. Aortic  valve area, by VTI measures 1.14 cm. Aortic valve mean gradient measures  21.0 mmHg. Aortic valve Vmax  measures 3.04 m/s.   6. The inferior vena cava is normal in size with greater than 50%  respiratory variability, suggesting right atrial pressure of 3 mmHg.   Comparison(s): Changes from prior study are noted. AS has progressed from  mild to moderate.   Laboratory Data: High Sensitivity Troponin:  No results for input(s): TROPONINIHS in the last 720 hours.  Recent Labs  Lab 03/26/24 1726 03/26/24 1941 03/26/24 2146  TRNPT 595* 662* 617*      Chemistry Recent Labs  Lab 03/26/24 1240 03/27/24 0500  NA 142 138  K 4.1 3.3*  CL 101 95*  CO2 22 28  GLUCOSE 224* 126*  BUN 40* 12  CREATININE 4.82* 1.60*  CALCIUM  9.3 9.2  GFRNONAA 9* 35*  ANIONGAP 20* 15    Recent Labs  Lab 03/26/24 1240 03/27/24 0500  PROT 6.3*  --   ALBUMIN  3.0* 3.7  AST 25  --   ALT 23  --   ALKPHOS 140*  --   BILITOT 0.3  --    Lipids No results for input(s):  CHOL, TRIG, HDL, LABVLDL, LDLCALC, CHOLHDL in the last 168 hours.  Hematology Recent Labs  Lab 03/26/24 1240 03/27/24 0500  WBC 12.2* 19.7*  RBC 3.64* 3.33*  HGB 8.7* 8.0*  HCT 29.8* 25.7*  MCV 81.9 77.2*  MCH 23.9* 24.0*  MCHC 29.2* 31.1  RDW 21.4* 21.8*  PLT 276 234   Thyroid  No results for input(s): TSH, FREET4 in the last 168 hours.  BNPNo results for input(s): BNP, PROBNP in the last 168 hours.  DDimer No results for input(s): DDIMER in  the last 168 hours.  Radiology/Studies:  DG CHEST PORT 1 VIEW Result Date: 03/26/2024 CLINICAL DATA:  Tachypnea EXAM: PORTABLE CHEST 1 VIEW COMPARISON:  03/26/2024, 03/15/2024 FINDINGS: Right-sided central venous catheter tip at the SVC. Cardiomegaly and aortic atherosclerosis. Vascular congestion and mild interstitial edema. Probable small pleural effusions. No pneumothorax IMPRESSION: Cardiomegaly with vascular congestion and mild interstitial edema. Probable small pleural effusions. Electronically Signed   By: Luke Bun M.D.   On: 03/26/2024 17:11   CT CHEST ABDOMEN PELVIS W CONTRAST Result Date: 03/26/2024 EXAM: CT CHEST, ABDOMEN AND PELVIS WITH CONTRAST 03/26/2024 11:54:52 AM TECHNIQUE: CT of the chest, abdomen and pelvis was performed with the administration of 100 mL of iohexol  (OMNIPAQUE ) 300 MG/ML solution. Multiplanar reformatted images are provided for review. Automated exposure control, iterative reconstruction, and/or weight based adjustment of the mA/kV was utilized to reduce the radiation dose to as low as reasonably achievable. COMPARISON: Previous CT from 11/18/2022, 12/19/2022, and 12/16/2023. CLINICAL HISTORY: Sepsis. FINDINGS: CHEST: MEDIASTINUM AND LYMPH NODES: Aortic atherosclerosis and coronary artery calcifications. Mild cardiac enlargement. No pericardial effusion. Aortic and mitral valve calcifications. The central airways are clear. No mediastinal, hilar or axillary lymphadenopathy. LUNGS AND PLEURA:  Lungs are hypoinflated. Scarring, architectural distortion and volume loss are again noted within the lung bases, similar to the previous exam. Several small lung nodules are identified appear unchanged from the previous exam. The largest is in the posterior right lower lobe measuring 5 mm, image 82/5. Stable since 11/18/2022. These are compatible with benign nodules. No focal consolidation or pulmonary edema. No pleural effusion. No pneumothorax. ABDOMEN AND PELVIS: LIVER: Geographic focal fatty infiltration deposition is identified within segment 7 and 6 of the inferior right lobe of liver. GALLBLADDER AND BILE DUCTS: Gallbladder appears normal. No biliary ductal dilatation. SPLEEN: No acute abnormality. PANCREAS: Pancreas is normal in size and contour without focal lesion or ductal dilatation. ADRENAL GLANDS: Bilateral adrenal nodules are again noted which have been stable 12/19/2022 compatible with benign adenomas. On the left this measures 2.9 x 1.8 cm, image 52/2. Unchanged. On the right this measures 2.8 x 1.6 cm. No follow-up imaging recommended. KIDNEYS, URETERS AND BLADDER: Bilateral kidney cysts are again noted. Previously characterized enhancing lesion of the inferior pole of the right kidney measures 1.9 x 1.6 cm, increased from 1.7 x 1.6 cm previously and remained suspicious for renal cell carcinoma. The remaining cysts have imaging characteristics typical of Bosniak class 1 and 2 kidney lesions. Per consensus, no follow-up is needed for simple Bosniak type 1 and 2 renal cysts, unless the patient has a malignancy history or risk factors. The largest cyst arises from the inferior pole, 3.6 cm. Filling defect within the right renal pelvis appears hyperdense, measuring 6 mm, image 340/4. This is unchanged from the previous exam and is indeterminate but does contain some internal calcifications as demonstrated on the unenhanced CT from 12/16/2023. There is a calcification identified at the left UVJ without  significant left-sided hydronephrosis or hydroureter. The calcification is punctate, image 500 35/4 and measures 3 mm. The urinary bladder is decompressed, and there is diffuse circumferential bladder wall thickening with overlying soft tissue stranding. No perinephric or periureteral stranding. GI AND BOWEL: Stomach demonstrates no acute abnormality. The appendix is visualized and appears normal. The colon is largely decompressed with colonic wall thickness measuring up to 5 mm, likely reflecting incomplete distention given the absence of surrounding pericolonic soft tissue stranding/inflammation. Moderate well-formed desiccated stool noted within the rectum. There is no bowel obstruction.  REPRODUCTIVE ORGANS: The uterus appears surgically absent. No adnexal mass. PERITONEUM AND RETROPERITONEUM: No ascites. No free air. No focal fluid collections within the abdomen or pelvis. VASCULATURE: Aorta is normal in caliber. Sclerotic and branch vessel disease. ABDOMINAL AND PELVIS LYMPH NODES: No lymphadenopathy. BONES AND SOFT TISSUES: Thoracic spondylosis. Unchanged anterior superior endplate deformity of T12. Unchanged superior endplate Schmorl's node deformity noted at L4. Focal rim-calcified fat density structure within the ventral abdomen is unchanged and most likely the sequelae of remote fat necrosis. Fat-containing umbilical hernia. Mild diffuse body wall edema. No acute osseous abnormality, other than the described chronic changes. No focal soft tissue abnormality, other than the described findings. IMPRESSION: 1. Diffuse circumferential bladder wall thickening with surrounding stranding and a 3 mm punctate calcification at the left UVJ without significant left hydroureteronephrosis, most consistent with acute cystitis/ureteritis with a distal ureteral stone follow-up urinalysis/culture. 2. Stable 6 mm hyperdense filling defect with internal calcifications in the right renal pelvis, indeterminate (differential  includes nonobstructing calculus, sloughed papilla, blood clot, or upper tract urothelial neoplasm). 3. Slight interval increase in size of the enhancing right lower pole renal lesion to 1.9 x 1.6 cm, suspicious for renal cell carcinoma (differential includes oncocytoma and lipid-poor angiomyolipoma); recommend urology consultation and renal protocol MRI (preferred) or CT without and with contrast for staging/characterization and management planning. 4. Aortic atherosclerosis and coronary artery calcifications. Electronically signed by: Waddell Calk MD 03/26/2024 12:19 PM EST RP Workstation: [id number]   CT Head Wo Contrast Result Date: 03/26/2024 EXAM: CT HEAD WITHOUT CONTRAST 03/26/2024 11:54:52 AM TECHNIQUE: CT of the head was performed without the administration of intravenous contrast. Automated exposure control, iterative reconstruction, and/or weight based adjustment of the mA/kV was utilized to reduce the radiation dose to as low as reasonably achievable. COMPARISON: 11/10/2023 CLINICAL HISTORY: Mental status change, unknown cause. FINDINGS: BRAIN AND VENTRICLES: No acute hemorrhage. No evidence of acute infarct. No hydrocephalus. No extra-axial collection. No mass effect or midline shift. Stable encephalomalacia within the left parieto-occipital region. Atherosclerotic calcifications within the cavernous internal carotid arteries. ORBITS: No acute abnormality. SINUSES: No acute abnormality. SOFT TISSUES AND SKULL: No acute soft tissue abnormality. No skull fracture. Mild opacification of the left mastoid air cells. IMPRESSION: 1. No acute intracranial abnormality. 2. Stable encephalomalacia in the left parieto-occipital region. 3. Mild left mastoid air cell opacification. Electronically signed by: Toribio Agreste MD 03/26/2024 12:02 PM EST RP Workstation: [id number]   DG Chest Port 1 View Result Date: 03/26/2024 EXAM: 1 VIEW(S) XRAY OF THE CHEST 03/26/2024 10:27:02 AM COMPARISON: 03/15/2024 CLINICAL  HISTORY: Questionable sepsis - evaluate for abnormality FINDINGS: LINES, TUBES AND DEVICES: Right IJ central venous catheter in place with tip terminating over the superior cavoatrial junction. LUNGS AND PLEURA: Small left pleural effusion. Mild diffuse interstitial opacities, likely edema. No pneumothorax. HEART AND MEDIASTINUM: Unchanged cardiomegaly. Aortic arch atherosclerosis. BONES AND SOFT TISSUES: No acute osseous abnormality. IMPRESSION: 1. Mild diffuse interstitial opacities, likely edema. 2. Small left pleural effusion. Electronically signed by: Waddell Calk MD 03/26/2024 10:40 AM EST RP Workstation: [id number]     Assessment and Plan:  Afib with RVR  Tele: Converted to A-fib with RVR on 1/4 around 1 am with HR up to 140's and most recent HR 100-120's.  No improvement with IV Lopressor  x1 . Started on IV amio.  Continue IV Amio.  ECHO pending.  Can resume PTA Toprol  XL based on ECHO results.  Will expect elevated HR in the setting on sepsis.  Most likely secondary to sepsis  Previously not on anticoagulation due to GI bleed history.   Hypokalemia, hypomagnesia K 3.3, mag 1.7 In the setting of arrhythmia, goal K 4-5 and Mg 2-3.  Order KCL and MG supplement.   Elevated troponin  No known angina.  No acute ischemic changes on EKG ECHO pending.  Most likely 2/2 to ESRD.  No need for further ischemic evaluation at this time  HTN  BP fluctuating between soft and hypotensive. Would avoid home antihypertensives for now in setting of sepsis.   Aortic Stenosis  Echo pending Continue to monitor as outpatient  OSA Not compliant with CPAP On BiPAP currently.  Sepsis due to UTI  Acute metabolic encephalopathy Acute respiratory distress Managed by hospitalist  ESRD Awaiting nephrology consult  Risk Assessment/Risk Scores:   CHA2DS2-VASc Score = 7   This indicates a 11.2% annual risk of stroke. The patient's score is based upon: CHF History: 0 HTN History:  1 Diabetes History: 1 Stroke History: 2 Vascular Disease History: 1 Age Score: 1 Gender Score: 1   For questions or updates, please contact [facility] Please consult [email or web address] for contact info under      Signed, Lorette CINDERELLA Kapur, PA-C  03/27/2024 10:49 AM      [1]  Allergies Allergen Reactions   Ace Inhibitors Anaphylaxis and Swelling   Penicillins Itching and Swelling    Has tolerated cefazolin  on multiple occasions    Statins Other (See Comments)    Elevated LFT's   Albuterol  Swelling   "

## 2024-03-27 NOTE — Progress Notes (Signed)
 Pt receives out-pt HD at [facility], MWF 0700 chair time. Will assist as needed.    [facility] Labresha Mellor Dialysis Navigator [phone]

## 2024-03-27 NOTE — Progress Notes (Signed)
 Pharmacy: Antimicrobial Stewardship Note  77 YOF with GPC in 3 of 3 bottles from 1/4 with BCID detecting MSSA with plans to narrow Rocephin  and Vancomycin  down to Cefazolin .  Noted admitted with AMS, with head CT negative - no MRI ordered at this time. Known prior MSSA L-hand infection in August 2025 with poor wound healing/osteo requiring amputation in November 2025.   ESRD-normal HD MWF, received HD early this AM. Noted tunneled RIJ HD cath placed 03/17/24.  Plan - Cefazolin  2g IV x 1 dose now - Start Cefazolin  1g IV every 24 hours on 1/6 evening - Will follow-up on plans for line holiday, dialysis plans, and continued work-up  Thank you for allowing pharmacy to be a part of this patients care.  Almarie Lunger, PharmD, BCPS, BCIDP Infectious Diseases Clinical Pharmacist 03/27/2024 11:08 AM   **Pharmacist phone directory can now be found on [email or web address] ([email or web address]).  Listed under [facility].

## 2024-03-27 NOTE — Progress Notes (Signed)
 Pharmacy Antibiotic Note  Kathryn Beck is a 69 y.o. female admitted on 03/26/2024,  Pharmacy has been consulted for vancomycin  dosing. Growing out MSSA was narrowed to cefazolin . Given worsening clinical condition and history of penicillin allergy  coverage was broadened to Vancomycin  and Meropenem .  Plan: Vancomycin  1750mg  x1 now then 750mg  IV every HD.  Goal pre-HD level 15-25 mcg/mL.    Height: 5' 4 (162.6 cm) Weight: 68.8 kg (151 lb 10.8 oz) IBW/kg (Calculated) : 54.7  Temp (24hrs), Avg:97.8 F (36.6 C), Min:97.4 F (36.3 C), Max:98.5 F (36.9 C)  Recent Labs  Lab 03/26/24 1021 03/26/24 1240 03/26/24 1726 03/27/24 0500  WBC  --  12.2*  --  19.7*  CREATININE  --  4.82*  --  1.60*  LATICACIDVEN 2.6* 3.5* 1.5  --     Estimated Creatinine Clearance: 32.5 mL/min (A) (by C-G formula based on SCr of 1.6 mg/dL (H)).    Allergies[1]   Thank you for allowing pharmacy to be a part of this patients care.  Larraine Brazier, PharmD Clinical Pharmacist 03/27/2024  8:51 PM **Pharmacist phone directory can now be found on [email or web address] ([email or web address]).  Listed under [facility].       [1]  Allergies Allergen Reactions   Ace Inhibitors Anaphylaxis and Swelling   Penicillins Itching and Swelling    Has tolerated cefazolin  on multiple occasions    Statins Other (See Comments)    Elevated LFT's   Albuterol  Swelling

## 2024-03-27 NOTE — Plan of Care (Signed)
  Problem: Education: Goal: Knowledge of General Education information will improve Description: Including pain rating scale, medication(s)/side effects and non-pharmacologic comfort measures Outcome: Not Progressing   Problem: Health Behavior/Discharge Planning: Goal: Ability to manage health-related needs will improve Outcome: Not Progressing   Problem: Clinical Measurements: Goal: Ability to maintain clinical measurements within normal limits will improve Outcome: Not Progressing Goal: Will remain free from infection Outcome: Not Progressing Goal: Diagnostic test results will improve Outcome: Not Progressing Goal: Respiratory complications will improve Outcome: Not Progressing Goal: Cardiovascular complication will be avoided Outcome: Not Progressing   Problem: Activity: Goal: Risk for activity intolerance will decrease Outcome: Not Progressing   Problem: Nutrition: Goal: Adequate nutrition will be maintained Outcome: Not Progressing   Problem: Coping: Goal: Level of anxiety will decrease Outcome: Not Progressing   Problem: Elimination: Goal: Will not experience complications related to bowel motility Outcome: Not Progressing Goal: Will not experience complications related to urinary retention Outcome: Not Progressing   Problem: Pain Managment: Goal: General experience of comfort will improve and/or be controlled Outcome: Not Progressing   Problem: Safety: Goal: Ability to remain free from injury will improve Outcome: Not Progressing   Problem: Skin Integrity: Goal: Risk for impaired skin integrity will decrease Outcome: Not Progressing   Problem: Education: Goal: Ability to describe self-care measures that may prevent or decrease complications (Diabetes Survival Skills Education) will improve Outcome: Not Progressing Goal: Individualized Educational Video(s) Outcome: Not Progressing   Problem: Coping: Goal: Ability to adjust to condition or change in  health will improve Outcome: Not Progressing   Problem: Fluid Volume: Goal: Ability to maintain a balanced intake and output will improve Outcome: Not Progressing   Problem: Health Behavior/Discharge Planning: Goal: Ability to identify and utilize available resources and services will improve Outcome: Not Progressing Goal: Ability to manage health-related needs will improve Outcome: Not Progressing   Problem: Metabolic: Goal: Ability to maintain appropriate glucose levels will improve Outcome: Not Progressing   Problem: Nutritional: Goal: Maintenance of adequate nutrition will improve Outcome: Not Progressing Goal: Progress toward achieving an optimal weight will improve Outcome: Not Progressing   Problem: Skin Integrity: Goal: Risk for impaired skin integrity will decrease Outcome: Not Progressing   Problem: Tissue Perfusion: Goal: Adequacy of tissue perfusion will improve Outcome: Not Progressing

## 2024-03-27 NOTE — Consult Note (Signed)
 ESRD Consult Note  Requesting provider: Bernardino Come Service requesting consult: Hospitalist Reason for consult: ESRD, provision of dialysis Indication for acute dialysis?: End Stage Renal Disease  Outpatient dialysis unit: [facility] Outpatient dialysis prescription: 3.5hr, 400/500, EDW 70.5kg, 2K/2.5Ca bath, AVF, no heparin  - Mircera 100mcg Iv q 2 weeks  Assessment/Recommendations: Kathryn Beck is a/an 69 y.o. female with a past medical history notable for ESRD on HD admitted with sepsis.   # ESRD: Received HD last night. Plan for line holiday. Likely next HD Thursday.  # Volume/ hypertension: some concern for volume excess on admission. More aggressive UF was limited by BP but able to get ~2.5L off. Sounds clear at this time. Low bps so no HTN meds  # Anemia of Chronic Kidney Disease: Hemoglobin 8. No iron given infection. Will follow trend and likely provide ESA. Transfusion if needed  # Secondary Hyperparathyroidism/Hyperphosphatemia: Cont home binders, sensipar , and calcitriol   # Vascular access: Remove TDC. Plan for line holiday and likely replace catheter on 1/8.  # Severe Sepsis: GPC on Bcx. Likely source is line but pneumonia and UTI other possible areas of concern. Continue antibiotics per primary. Remove line today.   # Shock: 2/2 sepsis. On levophed .  # Additional recommendations: - Dose all meds for creatinine clearance < 10 ml/min  - Unless absolutely necessary, no MRIs with gadolinium.  - Implement save arm precautions.  Prefer needle sticks in the dorsum of the hands or wrists.  No blood pressure measurements in arm. - If blood transfusion is requested during hemodialysis sessions, please alert us  prior to the session.  - Use synthetic opioids (Fentanyl /Dilaudid ) if needed  Recommendations were discussed with the primary team.   History of Present Illness: Kathryn Beck is a/an 69 y.o. female with a past medical history of ESRD who presents with  confusion.  Patient was notably admitted in November requiring hand amputation 2/2 steal associated with an HD access. She had a catheter placed and was using this for HD. Her catheter became dislodged and she was seen in the hospital around 12/24. Her catheter was exchanged during that time. She was feeling well, per husband, until Sunday morning when she started acting abnormally. She was lethargic and not answering questions. They denied knowledge of fevers, chills, sob, cp, n/v/d, dysuria.   In the ER she was more alert but was found to have a fever and concerning signs of CXR and CT scan for infection involving either the lungs or the bladder. She was started on antibiotics and admitted for further mgmt. Due to some hypoxia she was given dialysis overnight but had some hypotension requiring levophed . Bcx returned + for GPCs.   Medications:  Current Facility-Administered Medications  Medication Dose Route Frequency Provider Last Rate Last Admin   0.9 %  sodium chloride  infusion  250 mL Intravenous Continuous Hall, Carole N, DO       acetaminophen  (TYLENOL ) tablet 650 mg  650 mg Oral Q6H PRN Shona Laurence N, DO       alteplase  (CATHFLO ACTIVASE ) injection 2 mg  2 mg Intracatheter Once PRN Geralynn Charleston, MD       amiodarone  (NEXTERONE  PREMIX) 360-4.14 MG/200ML-% (1.8 mg/mL) IV infusion  30 mg/hr Intravenous Continuous Shona Laurence N, DO 16.67 mL/hr at 03/27/24 0750 30 mg/hr at 03/27/24 0750   amiodarone  (NEXTERONE  PREMIX) 360-4.14 MG/200ML-% (1.8 mg/mL) IV infusion            anticoagulant sodium citrate  solution 5 mL  5 mL Intracatheter  PRN Geralynn Charleston, MD       cefTRIAXone  (ROCEPHIN ) 2 g in sodium chloride  0.9 % 100 mL IVPB  2 g Intravenous Q24H Bryn Bernardino NOVAK, MD       Chlorhexidine  Gluconate Cloth 2 % PADS 6 each  6 each Topical Q0600 Bryn Bernardino NOVAK, MD   6 each at 03/27/24 0537   Chlorhexidine  Gluconate Cloth 2 % PADS 6 each  6 each Topical Q0600 Geralynn Charleston, MD   6 each at 03/27/24  0537   feeding supplement (NEPRO CARB STEADY) liquid 237 mL  237 mL Oral PRN Geralynn Charleston, MD       heparin  injection 1,000 Units  1,000 Units Intracatheter PRN Geralynn Charleston, MD       heparin  injection 5,000 Units  5,000 Units Subcutaneous Q8H Shona Terry SAILOR, DO   5,000 Units at 03/27/24 9460   insulin  aspart (novoLOG ) injection 0-9 Units  0-9 Units Subcutaneous Q4H Bryn Bernardino NOVAK, MD   1 Units at 03/27/24 9247   lidocaine  (PF) (XYLOCAINE ) 1 % injection 5 mL  5 mL Intradermal PRN Geralynn Charleston, MD       lidocaine -prilocaine  (EMLA ) cream 1 Application  1 Application Topical PRN Geralynn Charleston, MD       melatonin tablet 6 mg  6 mg Oral QHS PRN Shona Terry SAILOR, DO       norepinephrine  (LEVOPHED ) 4mg  in (0.016 mg/mL) premix infusion  0-10 mcg/min Intravenous Titrated Shona Terry SAILOR, DO 18.75 mL/hr at 03/27/24 0720 5 mcg/min at 03/27/24 0720   pentafluoroprop-tetrafluoroeth (GEBAUERS) aerosol 1 Application  1 Application Topical PRN Geralynn Charleston, MD       polyethylene glycol (MIRALAX  / GLYCOLAX ) packet 17 g  17 g Oral Daily PRN Shona Terry SAILOR, DO       prochlorperazine  (COMPAZINE ) injection 5 mg  5 mg Intravenous Q6H PRN Shona Terry SAILOR, DO       vancomycin  (VANCOREADY) IVPB 1750 mg/350 mL  1,750 mg Intravenous Once Bryk, Veronda P, RPH       [START ON 03/31/2024] vancomycin  (VANCOREADY) IVPB 750 mg/150 mL  750 mg Intravenous Q M,W,F-HD Bryk, Veronda P, RPH       Facility-Administered Medications Ordered in Other Encounters  Medication Dose Route Frequency Provider Last Rate Last Admin   0.9 %  sodium chloride  infusion   Intravenous Continuous Lockamy, Randi L, NP-C 20 mL/hr at 02/03/24 1625 New Bag at 02/03/24 1657   inclisiran (LEQVIO ) injection 284 mg  284 mg Subcutaneous Once Patel, Vaishali K, RPH       inclisiran (LEQVIO ) injection 284 mg  284 mg Subcutaneous Once Patel, Vaishali K, RPH         ALLERGIES Ace inhibitors, Penicillins, Statins, and Albuterol   MEDICAL HISTORY Past  Medical History:  Diagnosis Date   Abnormal MRI, cervical spine 03/04/2020   Acid reflux    Acute osteomyelitis of toe of left foot (HCC) 08/13/2022   Amputated left leg (HCC)    bilateral BKA   Anemia    Arthritis    Axillary masses    Soft tissue - status post excision   Back pain    CHF (congestive heart failure) (HCC)    COVID-19 virus infection 04/06/2019   CVA (cerebral vascular accident) (HCC) 12/17/2022   Depression    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  Last Assessment & Plan:   Multiple polyps with surveillance due in 2021.  IMO SNOMED Dx Update Oct 2024     History of pneumonia 2019   Hypoxia 04/03/2019   Memory loss    Mixed hyperlipidemia    Not currently working due to disabled status 06/13/2018   Formatting of this note might be different from the original.  Last Assessment & Plan:   Formatting of this note might be different from the original.  Reports being  disabled since April/May 2019 , hospitalized then in a niursing home, now on dialysis and is unable to work     Obesity    Pancreatitis    Peritoneal dialysis catheter in place    Pneumonia due to COVID-19 virus 04/02/2019   Sleep apnea    Noncompliant with CPAP   Stroke (HCC)    mini stroke   Type 2 diabetes mellitus (HCC)      SOCIAL HISTORY Social History   Socioeconomic History   Marital status: Married    Spouse name: Layani Foronda   Number of children: 2   Years of education: 12   Highest education level: Associate degree: occupational, scientist, product/process development, or vocational program  Occupational History   Occupation: retired    Tobacco Use   Smoking status: Never    Passive exposure: Never   Smokeless tobacco: Never   Tobacco comments:    Verified by Daughter, [name]  Vaping Use   Vaping status: Never Used  Substance and Sexual Activity   Alcohol  use: No   Drug use: No   Sexual activity: Not Currently    Partners: Male  Other Topics Concern   Not on file  Social History Narrative   Lives alone with husband    Right handed   Caffeine-1/2 daily   Social Drivers of Health   Tobacco Use: Low Risk (03/26/2024)   Patient History    Smoking Tobacco Use: Never    Smokeless Tobacco Use: Never    Passive Exposure: Never  Financial Resource Strain: Low Risk (02/22/2024)   Overall Financial Resource Strain (CARDIA)    Difficulty of Paying Living Expenses: Not hard at all  Food Insecurity: No Food Insecurity (03/26/2024)   Epic    Worried About Radiation Protection Practitioner of Food in the Last Year: Never true    Ran Out of Food in the Last Year: Never true  Transportation Needs: No Transportation Needs (03/26/2024)   Epic    Lack of Transportation (Medical): No    Lack of Transportation (Non-Medical): No  Physical Activity: Inactive (02/22/2024)   Exercise Vital Sign    Days of Exercise per Week: 0 days    Minutes of Exercise per Session: Not on file  Stress: Stress Concern Present (02/22/2024)   [employer] of Occupational Health - Occupational Stress Questionnaire    Feeling of Stress: To some extent  Social Connections: Moderately Integrated (03/26/2024)   Social Connection and Isolation Panel    Frequency of Communication with Friends and Family: More than three times a week    Frequency of Social Gatherings with Friends and Family: More than three times a week    Attends Religious Services: 1 to 4 times per year  Active Member of Clubs or Organizations: No    Attends Banker Meetings: Never    Marital Status: Married  Catering Manager Violence: Not At Risk (03/26/2024)   Epic    Fear of  Current or Ex-Partner: No    Emotionally Abused: No    Physically Abused: No    Sexually Abused: No  Depression (PHQ2-9): High Risk (02/22/2024)   Depression (PHQ2-9)    PHQ-2 Score: 14  Alcohol  Screen: Low Risk (05/03/2023)   Alcohol  Screen    Last Alcohol  Screening Score (AUDIT): 0  Housing: Low Risk (03/26/2024)   Epic    Unable to Pay for Housing in the Last Year: No    Number of Times Moved in the Last Year: 0    Homeless in the Last Year: No  Utilities: Not At Risk (03/26/2024)   Epic    Threatened with loss of utilities: No  Health Literacy: Adequate Health Literacy (05/03/2023)   B1300 Health Literacy    Frequency of need for help with medical instructions: Rarely  Recent Concern: Health Literacy - Inadequate Health Literacy (02/08/2023)   B1300 Health Literacy    Frequency of need for help with medical instructions: Often     FAMILY HISTORY Family History  Problem Relation Age of Onset   Hypertension Father    Hypercholesterolemia Father    Arthritis Father    Hypertension Sister    Hypercholesterolemia Sister    Breast cancer Sister    Hypertension Sister    Colon cancer Neg Hx    Colon polyps Neg Hx      Review of Systems: Unable to obtain due to the patient's altered mental status  Physical Exam: Vitals:   03/27/24 0915 03/27/24 0930  BP: (!) 120/98 (!) 120/92  Pulse:    Resp:    Temp:    SpO2:     Total I/O In: 326 [I.V.:326] Out: 2300 [Other:2300]  Intake/Output Summary (Last 24 hours) at 03/27/2024 1002 Last data filed at 03/27/2024 0810 Gross per 24 hour  Intake 2430.23 ml  Output 2300 ml  Net 130.23 ml   General: Lying in bed in no distress, minimally interactive, lethargic HEENT: anicteric sclera, MMM CV: Tachycardic, irregularly irregular rhythm, no lower extremity edema, no rub Lungs: bilateral chest rise, normal wob on BiPAP Abd: soft, non-tender, non-distended Skin: no visible lesions or rashes Psych: Not interactive.  Unable to assess  mood and affect Neuro: Lethargic, minimally responsive to pain  Test Results Reviewed Lab Results  Component Value Date   NA 138 03/27/2024   K 3.3 (L) 03/27/2024   CL 95 (L) 03/27/2024   CO2 28 03/27/2024   BUN 12 03/27/2024   CREATININE 1.60 (H) 03/27/2024   GLU 211 07/17/2019   CALCIUM  9.2 03/27/2024   ALBUMIN  3.7 03/27/2024   PHOS 2.0 (L) 03/27/2024    I have reviewed relevant outside healthcare records

## 2024-03-27 NOTE — H&P (Addendum)
 "  NAME:  Kathryn Beck, MRN:  [id number], DOB:  29-Aug-1955, LOS: 1 ADMISSION DATE:  03/26/2024, CONSULTATION DATE:  03/27/2024 REFERRING MD:  Bryn Bernardino NOVAK, MD  CHIEF COMPLAINT:  septic shock   History of Present Illness:  69 year old female with history of ESRD-HD (MWF) via tunneled HD placed 12/26, PVD s/p B/L BKAs, left hand amputation, HFpEF, HTN, DM2, obesity, PAF, who presented to [facility] on 1/4 with sepsis and AMS.  Patient also required BiPAP and urgent hemodialysis.  Blood cultures grew MSSA bacteremia Vanco changed to cefazolin  by ID.  Patient also went to A-fib RVR requiring amiodarone  gtt. Patient was transferred to [facility], ICU for septic shock requiring Levophed , and persistent encephalopathy & respiratory failure   Pertinent  Medical History  ESRD-HD, PVD s/p bilateral BKA's, left hand amputation, HFpEF, HTN, DM2, obesity, paroxysmal A-fib  Significant Hospital Events: Including procedures, antibiotic start and stop dates in addition to other pertinent events   Transferred from [facility] on 1/5 for septic shock requiring levo  Interim History / Subjective:    Objective    Blood pressure 104/77, pulse 79, temperature 98.1 F (36.7 C), temperature source Axillary, resp. rate 19, height 5' 4 (1.626 m), weight 68.8 kg, SpO2 100%.    Vent Mode: PCV;BIPAP FiO2 (%):  [40 %-60 %] 60 % Set Rate:  [14 bmp] 14 bmp PEEP:  [8 cmH20-9 cmH20] 8 cmH20   Intake/Output Summary (Last 24 hours) at 03/27/2024 1928 Last data filed at 03/27/2024 1850 Gross per 24 hour  Intake 660.87 ml  Output 2300 ml  Net -1639.13 ml   Filed Weights   03/26/24 2000 03/27/24 0130 03/27/24 0810  Weight: 71.1 kg 71.1 kg 68.8 kg    Examination: General: Critically ill, on BiPAP HENT: NCAT Lungs: Symmetrical chest wall movement, coarse bronchial sounds bilaterally, on BiPAP Cardiovascular: S1-S2 normal Abdomen: Soft nontender nondistended Extremities: Bilateral BKA's, left hand amputation Neuro:  Lethargic, nonpurposeful movement GU: Deferred  Resolved problem list   Assessment and Plan   69 year old female with history of ESRD-HD (MWF) PVD s/p B/L BKAs, left lower amputation, HFpEF, HTN, DM2, PAF who was transferred from [facility] for septic shock requiring Levophed , persistent encephalopathy and respiratory failure.  -Acute metabolic encephalopathy Likely due to sepsis, possible septic brain infarct due to staph bacteremia EEG of 1/5-no seizures appreciated - Respiratory failure - Septic shock - A-fib RVR-on amiodarone  - PAF - HFpEF - CAD-last cath 10/20 normal coronaries, echo 3/25: Preserved EF 55% with grade 1 diastolic dysfunction, moderate AAS, severe dilatation of the left atrial 8/25-hypotension A-fib RVR underwent DCCV-to NSR - MSSA bacteremia 12/26 placement of tunneled HD cath by vascular, DC'd on 1/5 CT-consistent with acute cystitis - ESRD - Possible left UVJ ureterolithiasis without significant left hydro nephrosis-per urology no intervention At this time - Chronic anemia likely due to ESRD-stable - HTN - OSA - DM 2 -hypokalemia -Hypomagnesemia    Plan - ICU monitoring and management - Wean off BiPAP as tolerated - Wean off levo as tolerated, MAP goal> 65 - Continue amiodarone , not on AC due to significant GI bleeding history - Due to vasopressor requirement, and respiratory failure, and history of MDRO, broadening Abx to vancomycin  and meropenem  (allergic to penicillin) - ID team is on board - Follow-up echo - Cardiology team on board, not a candidate for TEE at this time - Next blood culture ordered from 1/6, Line holiday - Nephrology team on board for HD - Monitor and replace electrolyte as  needed    Discussed with husband at the bedside, he is aware patient is critically ill, high risk for decompensation.  At this time still wants full code.   Labs   CBC: Recent Labs  Lab 03/26/24 1240 03/27/24 0500  WBC 12.2* 19.7*  NEUTROABS  10.8* 16.4*  HGB 8.7* 8.0*  HCT 29.8* 25.7*  MCV 81.9 77.2*  PLT 276 234    Basic Metabolic Panel: Recent Labs  Lab 03/26/24 1240 03/27/24 0500  NA 142 138  K 4.1 3.3*  CL 101 95*  CO2 22 28  GLUCOSE 224* 126*  BUN 40* 12  CREATININE 4.82* 1.60*  CALCIUM  9.3 9.2  MG  --  2.0  PHOS  --  2.0*   GFR: Estimated Creatinine Clearance: 32.5 mL/min (A) (by C-G formula based on SCr of 1.6 mg/dL (H)). Recent Labs  Lab 03/26/24 1021 03/26/24 1240 03/26/24 1726 03/27/24 0500  WBC  --  12.2*  --  19.7*  LATICACIDVEN 2.6* 3.5* 1.5  --     Liver Function Tests: Recent Labs  Lab 03/26/24 1240 03/27/24 0500  AST 25  --   ALT 23  --   ALKPHOS 140*  --   BILITOT 0.3  --   PROT 6.3*  --   ALBUMIN  3.0* 3.7   No results for input(s): LIPASE, AMYLASE in the last 168 hours. No results for input(s): AMMONIA in the last 168 hours.  ABG    Component Value Date/Time   HCO3 25.9 03/27/2024 1732   TCO2 24 03/17/2024 0850   O2SAT 80.8 03/27/2024 1732     Coagulation Profile: Recent Labs  Lab 03/26/24 1240  INR 1.4*    Cardiac Enzymes: No results for input(s): CKTOTAL, CKMB, CKMBINDEX, TROPONINI in the last 168 hours.  HbA1C: Hemoglobin-A1c  Date/Time Value Ref Range Status  08/22/2013 12:00 AM 10.3  Final   Hemoglobin A1C  Date/Time Value Ref Range Status  04/20/2023 10:05 AM 8.4 (A) 4.0 - 5.6 % Final  12/15/2022 01:30 PM 7.7 (A) 4.0 - 5.6 % Final  07/17/2019 12:00 AM 7.8  Final   HbA1c, POC (controlled diabetic range)  Date/Time Value Ref Range Status  06/18/2020 11:28 AM 8.4 (A) 0.0 - 7.0 % Final  02/28/2020 03:59 PM 7.2 (A) 0.0 - 7.0 % Final   Hgb A1c MFr Bld  Date/Time Value Ref Range Status  10/27/2023 11:08 AM 6.7 (H) 4.8 - 5.6 % Final    Comment:    (NOTE)         Prediabetes: 5.7 - 6.4         Diabetes: >6.4         Glycemic control for adults with diabetes: <7.0   07/07/2022 11:40 AM 6.3 (H) 4.8 - 5.6 % Final    Comment:              Prediabetes: 5.7 - 6.4          Diabetes: >6.4          Glycemic control for adults with diabetes: <7.0     CBG: Recent Labs  Lab 03/27/24 0343 03/27/24 0752 03/27/24 1137 03/27/24 1612 03/27/24 1850  GLUCAP 123* 148* 154* 146* 147*    Review of Systems:   Negative except above   Past Medical History:  She,  has a past medical history of Abnormal MRI, cervical spine (03/04/2020), Acid reflux, Acute osteomyelitis of toe of left foot (HCC) (08/13/2022), Amputated left leg (HCC), Anemia, Arthritis, Axillary masses, Back pain,  CHF (congestive heart failure) (HCC), COVID-19 virus infection (04/06/2019), CVA (cerebral vascular accident) (HCC) (12/17/2022), Depression, 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   Surgical History:   Past Surgical History:  Procedure Laterality Date   ABDOMINAL AORTOGRAM W/LOWER EXTREMITY N/A 04/30/2022   Procedure: ABDOMINAL AORTOGRAM W/LOWER EXTREMITY;  Surgeon: Magda Debby SAILOR, MD;  [facility]: [facility];  Service: Cardiovascular;  Laterality: N/A;   ABDOMINAL AORTOGRAM W/LOWER EXTREMITY N/A 07/21/2022   Procedure: ABDOMINAL AORTOGRAM W/LOWER EXTREMITY;  Surgeon: Serene Gaile ORN, MD;  Location: [facility];  Service: Cardiovascular;  Laterality: N/A;   ABDOMINAL HYSTERECTOMY     ACHILLES TENDON LENGTHENING  08/15/2022   Procedure: ACHILLES TENDON LENGTHENING;  Surgeon: Silva Juliene SAUNDERS, DPM;  Location: [facility] OR;  Service:  Podiatry;;   AMPUTATION Right 05/29/2022   Procedure: RIGHT BELOW THE KNEE AMPUTATION;  Surgeon: Harden Jerona GAILS, MD;  Location: [facility] OR;  Service: Orthopedics;  Laterality: Right;   AMPUTATION Left 09/04/2022   Procedure: AMPUTATION FOOT, serial irrigation;  Surgeon: Joya Stabs, DPM;  Location: [facility] OR;  Service: Podiatry;  Laterality: Left;  Surgical team to do block   AMPUTATION Left 10/07/2022   Procedure: LEFT BELOW KNEE AMPUTATION;  Surgeon: Harden Jerona GAILS, MD;  Location: [facility] OR;  Service: Orthopedics;  Laterality: Left;   AMPUTATION Left 02/03/2024   Procedure: AMPUTATION, HAND;  Surgeon: Arlinda Buster, MD;  Location: [facility] OR;  Service: Orthopedics;  Laterality: Left;  LEFT HAND AMPUTATION   AMPUTATION FINGER Left 09/29/2023   Procedure: AMPUTATION, FINGER;  Surgeon: Harden Jerona GAILS, MD;  Location: [facility] OR;  Service: Orthopedics;  Laterality: Left;  LEFT HAND LONG FINGER RAY AMPUTATION   AV FISTULA PLACEMENT Left 09/02/2017   Procedure: creation of left arm ARTERIOVENOUS (AV) FISTULA;  Surgeon: Serene Gaile ORN, MD;  Location: [facility] OR;  Service: Vascular;  Laterality: Left;   COLONOSCOPY  2008   Dr. Harvey: normal    COLONOSCOPY N/A 12/18/2016   Dr. Harvey: multiple tubular adenomas, internal hemorrhoids. Surveillance in 3 years    COLONOSCOPY N/A 12/05/2023   Procedure: COLONOSCOPY;  Surgeon: Cindie Carlin POUR, DO;  Location: [facility];  Service: Endoscopy;  Laterality: N/A;   COLONOSCOPY N/A 12/17/2023   Procedure: COLONOSCOPY;  Surgeon: Shaaron Lamar HERO, MD;  [facility]: [facility];  Service: Endoscopy;  Laterality: N/A;   COLONOSCOPY N/A 01/01/2024   Procedure: COLONOSCOPY;  Surgeon: Cindie Carlin POUR, DO;  [facility];  Service: Endoscopy;  Laterality: N/A;   DIALYSIS/PERMA CATHETER INSERTION N/A 03/17/2024   Procedure: DIALYSIS/PERMA CATHETER INSERTION;  Surgeon: Magda Debby SAILOR, MD;  [facility];  Service: Cardiovascular;  Laterality: N/A;   ESOPHAGEAL  DILATION N/A 10/13/2015   Procedure: ESOPHAGEAL DILATION;  Surgeon: Claudis RAYMOND Rivet, MD;  [facility]: [facility];  Service: Endoscopy;  Laterality: N/A;   ESOPHAGEAL DILATION N/A 12/03/2023   Procedure: DILATION, ESOPHAGUS;  Surgeon: Cindie Carlin POUR, DO;  [facility];  Service: Endoscopy;  Laterality: N/A;   ESOPHAGOGASTRODUODENOSCOPY N/A 10/13/2015   Dr. Rivet: chronic gastritis on path, no H.pylori. Empiric dilation  ESOPHAGOGASTRODUODENOSCOPY N/A 12/18/2016   Dr. Harvey: mild gastritis. BRAVO study revealed uncontrolled GERD. Dysphagia secondary to uncontrolled reflux   ESOPHAGOGASTRODUODENOSCOPY N/A 12/03/2023   Procedure: EGD (ESOPHAGOGASTRODUODENOSCOPY);  Surgeon: Cindie Carlin POUR, DO;  [facility];  Service: Endoscopy;  Laterality: N/A;   FOOT SURGERY Bilateral    nerve     INCISION AND DRAINAGE OF WOUND Left 10/30/2023   Procedure: IRRIGATION AND DEBRIDEMENT WOUND;  Surgeon: Arlinda Buster, MD;  Location: [facility] OR;  Service: Orthopedics;  Laterality: Left;  LEFT HAND WOUND   INSERTION OF DIALYSIS CATHETER N/A 02/03/2024   Procedure: INSERTION OF DIALYSIS CATHETER;  Surgeon: Magda Debby SAILOR, MD;  Location: [facility] OR;  Service: Vascular;  Laterality: N/A;   LEFT HEART CATH AND CORONARY ANGIOGRAPHY N/A 12/29/2018   Procedure: LEFT HEART CATH AND CORONARY ANGIOGRAPHY;  Surgeon: Dann Candyce RAMAN, MD;  Location: [facility] INVASIVE CV LAB;  Service: Cardiovascular;  Laterality: N/A;   LIGATION OF ARTERIOVENOUS  FISTULA Left 02/03/2024   Procedure: LIGATION OF ARTERIOVENOUS  FISTULA;  Surgeon: Magda Debby SAILOR, MD;  Location: [facility] OR;  Service: Vascular;  Laterality: Left;   LOWER EXTREMITY ANGIOGRAPHY Right 05/04/2022   Procedure: Lower Extremity Angiography;  Surgeon: Lanis Fonda BRAVO, MD;  Location: [facility] INVASIVE CV LAB;  Service: Cardiovascular;  Laterality: Right;   LUNG BIOPSY     MASS EXCISION Right 01/09/2013   Procedure: EXCISION OF NEOPLASM OF RIGHT  AXILLA  AND  EXCISION OF NEOPLASM OF LEFT AXILLA;  Surgeon: Oneil DELENA Budge, MD;  Location: [facility]S;  Service: General;  Laterality: Right;  procedure end @ 08:23   MYRINGOTOMY WITH TUBE PLACEMENT Bilateral 04/28/2017   Procedure: BILATERAL MYRINGOTOMY WITH TUBE PLACEMENT;  Surgeon: Karis Clunes, MD;  Location: [facility] OR;  Service: ENT;  Laterality: Bilateral;   PERIPHERAL VASCULAR BALLOON ANGIOPLASTY Right 05/04/2022   Procedure: PERIPHERAL VASCULAR BALLOON ANGIOPLASTY;  Surgeon: Lanis Fonda BRAVO, MD;  Location: [facility] INVASIVE CV LAB;  Service: Cardiovascular;  Laterality: Right;  PT   PERIPHERAL VASCULAR INTERVENTION Right 05/04/2022   Procedure: PERIPHERAL VASCULAR INTERVENTION;  Surgeon: Lanis Fonda BRAVO, MD;  Location: [facility] INVASIVE CV LAB;  Service: Cardiovascular;  Laterality: Right;  SFA   PERIPHERAL VASCULAR INTERVENTION Left 07/21/2022   Procedure: PERIPHERAL VASCULAR INTERVENTION;  Surgeon: Serene Gaile ORN, MD;  [facility];  Service: Cardiovascular;  Laterality: Left;   REVISION OF ARTERIOVENOUS GORETEX GRAFT Left 05/04/2018   Procedure: TRANSPOSITION OF CEPHALIC VEIN ARTERIOVENOUS FISTULA LEFT ARM;  Surgeon: Oris Krystal FALCON, MD;  Location: [facility] OR;  Service: Vascular;  Laterality: Left;   SAVORY DILATION N/A 12/18/2016   Procedure: SAVORY DILATION;  Surgeon: Harvey Margo CROME, MD;  [facility];  Service: Endoscopy;  Laterality: N/A;   TRANSMETATARSAL AMPUTATION Left 08/15/2022   Procedure: TRANSMETATARSAL AMPUTATION;  Surgeon: Silva Juliene SAUNDERS, DPM;  Location: [facility] OR;  Service: Podiatry;  Laterality: Left;     Social History:   reports that she has never smoked. She has never been exposed to tobacco smoke. She has never used smokeless tobacco. She reports that she does not drink alcohol  and does not use drugs.   Family History:  Her family history includes Arthritis in her father; Breast cancer in her sister; Hypercholesterolemia in her father and sister; Hypertension in her father, sister, and  sister. There is no history of Colon cancer or Colon polyps.   Allergies Allergies[1]   Home Medications  Prior to Admission medications  Medication Sig Start Date End Date  Taking? Authorizing Provider  acetaminophen  (TYLENOL ) 500 MG tablet Take 1,000 mg by mouth every 6 (six) hours as needed for mild pain (pain score 1-3).    [provider]  amLODipine  (NORVASC ) 10 MG tablet Take 0.5 tablets (5 mg total) by mouth daily. 10/28/23   Pearlean Manus, MD  ASPIRIN  LOW DOSE 81 MG chewable tablet Chew 162 mg by mouth daily. 01/05/24   [provider]  B Complex-C-Folic Acid  (RENA-VITE RX) 1 MG TABS Take 1 tablet by mouth daily. 04/15/22   [provider]  busPIRone  (BUSPAR ) 7.5 MG tablet Take 1 tablet (7.5 mg total) by mouth 3 (three) times daily. 02/22/24   Antonetta Rollene BRAVO, MD  calcitRIOL  (ROCALTROL ) 0.5 MCG capsule Take 2 capsules (1 mcg total) by mouth every Monday, Wednesday, and Friday with hemodialysis. 11/05/23   Caleen Burgess BROCKS, MD  cinacalcet  (SENSIPAR ) 30 MG tablet Take 3 tablets (90 mg total) by mouth every Monday, Wednesday, and Friday. 12/08/23   Johnson, Clanford L, MD  ezetimibe  (ZETIA ) 10 MG tablet Take 1 tablet (10 mg total) by mouth daily. 04/27/23   Antonetta Rollene BRAVO, MD  insulin  aspart (NOVOLOG ) 100 UNIT/ML injection Inject 0-6 Units into the skin 3 (three) times daily with meals. CBG 70 - 120: 0 units  CBG 121 - 150: 0 units  CBG 151 - 200: 1 unit  CBG 201-250: 2 units  CBG 251-300: 3 units  CBG 301-350: 4 units  CBG 351-400: 5 units  CBG > 400: Give 10 units and call MD 11/12/23   Shahmehdi, Seyed A, MD  insulin  glargine, 2 Unit Dial , (TOUJEO  MAX SOLOSTAR) 300 UNIT/ML Solostar Pen Inject 30 Units into the skin daily. May need to slowly increase the dose depending upon your blood sugar, follow-up with PCP 01/01/23   Trixie File, MD  metoprolol  succinate (TOPROL -XL) 50 MG 24 hr tablet TAKE ONE TABLET BY MOUTH ONCE DAILY WITH FOOD (TAKE AFTER  DIALYSIS) 12/20/23   Antonetta Rollene BRAVO, MD  mirtazapine  (REMERON ) 7.5 MG tablet Take 1 tablet (7.5 mg total) by mouth at bedtime. 03/06/24   Antonetta Rollene BRAVO, MD  ondansetron  (ZOFRAN ) 4 MG tablet Take 4 mg by mouth every 8 (eight) hours as needed for nausea or vomiting. 11/26/23   [provider]  pantoprazole  (PROTONIX ) 40 MG tablet TAKE ONE TABLET BY MOUTH EVERY DAY 03/01/24   Antonetta Rollene BRAVO, MD  pregabalin  (LYRICA ) 50 MG capsule Take 1 capsule (50 mg total) by mouth 3 (three) times daily. 03/03/24   Antonetta Rollene BRAVO, MD  senna (SENOKOT) 8.6 MG TABS tablet Take 1 tablet (8.6 mg total) by mouth daily as needed for mild constipation or moderate constipation. 02/06/24   Arlice Reichert, MD  sertraline  (ZOLOFT ) 100 MG tablet Take 1 tablet (100 mg total) by mouth daily. 02/22/24   Antonetta Rollene BRAVO, MD  [name]  500 MG chewable tablet Chew 1-2 tablets (500-1,000 mg total) by mouth See admin instructions. Take 1000mg  (2 tablets) by mouth with meals and 500mg  (1 tablet) with snacks 08/23/23   Antonetta Rollene BRAVO, MD  FLUoxetine (PROZAC) 10 MG capsule Take 10 mg by mouth daily.    05/28/11  [provider]  glipiZIDE  (GLUCOTROL ) 10 MG tablet Take 10 mg by mouth 2 (two) times daily before a meal.    05/28/11  [provider]     Critical care time: 40 mins     Lenny Drought, MD  [facility] Prefer epic messenger for cross cover needs  Critical care time was exclusive of separately billable procedures and treating other patients.  Critical care was necessary to treat or prevent imminent or life-threatening deterioration.  Critical care was time spent personally by me on the following activities: development of treatment plan with patient and/or surrogate as well as nursing, discussions with consultants, evaluation of patient's response to treatment, examination of patient, obtaining history from patient or surrogate, ordering and performing  treatments and interventions, ordering and review of laboratory studies, ordering and review of radiographic studies, pulse oximetry, re-evaluation of patient's condition and participation in multidisciplinary rounds.             [1]  Allergies Allergen Reactions   Ace Inhibitors Anaphylaxis and Swelling   Penicillins Itching and Swelling    Has tolerated cefazolin  on multiple occasions    Statins Other (See Comments)    Elevated LFT's   Albuterol  Swelling   "

## 2024-03-27 NOTE — Progress Notes (Addendum)
 eLink Physician-Brief Progress Note Patient Name: Kathryn Beck DOB: 1955-12-17 MRN: [id number]   Date of Service  03/27/2024  HPI/Events of Note  Asked to review admission labs-ABG, CMP, BNP, CBC, and D-dimer all reviewed  eICU Interventions  No actionable findings.  Type and screen with neck set of labs.   9374 -BMP glucose is inconsistent with CBG, suspect lab error, intervene per CBG.  Repeat BMP pending.  Intervention Category Minor Interventions: Clinical assessment - ordering diagnostic tests  Maragret Vanacker 03/27/2024, 10:56 PM

## 2024-03-27 NOTE — Progress Notes (Signed)
*  PRELIMINARY RESULTS* Echocardiogram 2D Echocardiogram has been performed.  Kathryn Beck 03/27/2024, 4:25 PM

## 2024-03-27 NOTE — Plan of Care (Signed)

## 2024-03-27 NOTE — Progress Notes (Signed)
 Dialysis nurse in to initiate HD treatment, HR up to 130's. ICU Nurse consulting with Provider

## 2024-03-27 NOTE — Progress Notes (Signed)
 Pharmacy Antibiotic Note  Kathryn Beck is a 69 y.o. female admitted on 03/26/2024, now with bacteremia (GPC, awaiting C/S).  Pharmacy has been consulted for vancomycin  dosing.  Plan: Vancomycin  1750mg  x1 now then 750mg  IV every HD.  Goal pre-HD level 15-25 mcg/mL.  Pt getting HD tx now, started at 0200.  Height: 5' 4 (162.6 cm) Weight: 71.1 kg (156 lb 12 oz) IBW/kg (Calculated) : 54.7  Temp (24hrs), Avg:98.8 F (37.1 C), Min:97.5 F (36.4 C), Max:101.6 F (38.7 C)  Recent Labs  Lab 03/26/24 1021 03/26/24 1240 03/26/24 1726  WBC  --  12.2*  --   CREATININE  --  4.82*  --   LATICACIDVEN 2.6* 3.5* 1.5    Estimated Creatinine Clearance: 11 mL/min (A) (by C-G formula based on SCr of 4.82 mg/dL (H)).    Allergies[1]   Thank you for allowing pharmacy to be a part of this patients care.  Marvetta Dauphin, PharmD, BCPS  03/27/2024 2:33 AM     [1]  Allergies Allergen Reactions   Ace Inhibitors Anaphylaxis and Swelling   Penicillins Itching and Swelling    Has tolerated cefazolin  on multiple occasions    Statins Other (See Comments)    Elevated LFT's   Albuterol  Swelling

## 2024-03-27 NOTE — Progress Notes (Addendum)
"                                    TRH, overnight cross coverage.  IV vancomycin  added for 2/2 bottles positive for gram positive cocci.  A transthoracic echocardiogram has already been ordered and is pending.  No charge note.  Addendum: Amiodarone  drip added for A-fib with RVR with rates in the 150s.  IV vasopressor, Levophed , also added for hypotension. "

## 2024-03-27 NOTE — Progress Notes (Signed)
 [facility] PROGRESS NOTE  Kathryn Beck (DOB: 07-17-1955) FMW:[id number] PCP: Kathryn Rollene BRAVO, MD  Brief Narrative: Kathryn Beck is a 69 y.o. female with a history including ESRD (HD MWF) through tunneled HD catheter placed 12/26, PVD s/p bilateral BKAs and left hand amputation, HFpEF, HTN, T2DM, obesity, PAF, OSA not tolerant of CPAP recently seen in this ED 1/2 for sore throat, who presented to the ED 1/4 with concern for altered mental status. Workup was consistent with sepsis with initial source suspected to be cystitis based on CT scan. She was given ceftriaxone  and sepsis IVF with subsequent worsening of respiratory status requiring BiPAP and urgent hemodialysis. She has remained with improved respiratory effort and oxygenation on BiPAP. Blood cultures ultimately grew GPCs with rapid ID of MSSA thus far, for which antibiotics were broadened and HD catheter has been removed. Hospitalization complicated by persistent encephalopathy, respiratory failure, shock state requiring levophed , AFib with RVR requiring amiodarone  infusion.   Subjective: Not responsive to sternal rub or loud sounds this morning, later perked up and holds eyes open, tracks, still not following commands. Many family members at bedside, inquiring into transfer to [facility].  Objective: BP 105/82   Pulse (!) 119   Temp 98.5 F (36.9 C) (Axillary)   Resp (!) 36   Ht 5' 4 (1.626 m)   Wt 68.8 kg   SpO2 100%   BMI 26.04 kg/m   Gen: Critically ill-appearing female in no distress Pulm: Crackles improved, tachypneic with normal WOB on BiPAP CV: Rapid irregular, no MRG, no dependent edema at hip GI: Soft, NT, ND, +BS  Neuro: Poorly responsive, later in day spontaneously moving all extremities. Ext: Warm, dry, BL BKA, LUE amputation Skin: No new rashes, lesions or ulcers on visualized skin. The AVF site is nontender without exudate or erythema. HD cath site has no discharge or erythema or tenderness.   Assessment &  Plan: Septic shock due to MSSA bacteremia: - ID consulted, pending TTE, will repeat blood cultures. Currently not a candidate for TEE.  - Source control: Remove HD cath today ASAP - Abx per ID -> change to ancef   - Continue levophed  thru current access, attempting to avoid central line. Pt is critically ill with multiple consultant needs, family inquired into transfer to [facility] which I believe is appropriate. Discussed with PCCM, Dr. Claudene, who accepts transfer to [facility] ICU.    Acute metabolic encephalopathy: Suspect this is related to sepsis due to bacteremia. CT head nonacute, stable encephalomalacia.  - EEG nonspecific. - Neuro checks q4h - If persistent would require brain MRI.    Acute respiratory distress, acute on chronic HFpEF, pulmonary edema: Appropriate IVF resuscitation for sepsis, though worsened pulmonary edema. 2D ECHO 05/2023 w/ EF 55% and grade 1 DD  - Managed volume with HD - Continue BiPAP prn, tolerating well.    ESRD:  - Continue HD per nephrology. Will remove HD cath today now that she's completed dialysis and give line holiday.    PAF, Atrial fibrillation with RVR:  - Options limited, treat infection/precipitant.  - Continue amiodarone  pending cardiology recommendations.  - Not on anticoagulation due to significant GI bleed history.   Possible left UVJ ureterolithiasis:  - Urology, Dr. Alvaro, consulted. No intervention necessary at this time.    Elevated troponin: No chest pain at present. Some elevation is expected in ESRD patient with sepsis and respiratory distress.  - Troponin trend reassuring, cardiology consulted for this and AFib w/RVR - Check echo for [facility]  Anemia of ESRD: Roughly stable - Monitor   HTN:  - Restart medications once more stable    OSA: Per daughter, does not tolerate CPAP at home. - BiPAP as above   IDT2DM:  - SSI q4h for now.    Depression, anxiety:  - Continue home medications once more stable  Goals of care discussion: Pt's  family aware of grim prognosis at this time, prefer to continue all aggressive measures. Very poor candidate for intubation. Daughter understands this and wants to avoid it at all costs.  Kathryn KATHEE Come, MD [facility] [email or web address] 03/27/2024, 3:28 PM

## 2024-03-27 NOTE — Consult Note (Addendum)
 "                                                                  [facility]                                                                                        Patient Identification: Patient Name: MARKEISHA MANCIAS MRN: [id number] Admit Date: 03/26/2024  9:51 AM Today's Date: 03/27/2024 Reason for consult: Staph bacteremia Requesting provider: Sharyon auto consult  Principal Problem:   Sepsis due to urinary tract infection (HCC) Active Problems:   Hyperlipidemia   Obesity   OSA (obstructive sleep apnea)   HTN (hypertension)   ESRD on hemodialysis (HCC)   PAD (peripheral artery disease)   GAD (generalized anxiety disorder)   Type 2 diabetes mellitus with other specified complication (HCC)   PAF (paroxysmal atrial fibrillation) (HCC)   History of GI bleeding   S/P bilateral BKA (below knee amputation) (HCC)   Neuropathy due to type 2 diabetes mellitus (HCC)   Antibiotics: Ceftriaxone  1/4-  Lines/Hardware: Right IJ tunneled HD catheter, AVF   Assessment # Septic shock  # MSSA bacteremia  # Possible CLABSI - 12/26 placement of tunneled dialysis catheter by vascular - off pressors this   # CT with diffuse circumferential bladder wall thickening with surrounding stranding and a 3 mm punctate calcification at the left UVJ without significant left hydroureteronephrosis, most consistent with acute cystitis/ureteritis with a  ureteral stone. Stable 6 mm hyperdense filling defect with internal calcifications in the right renal pelvis, indeterminate. Slight interval increase in size of the enhancing right lower pole renal lesion to 1.9 x 1.6 cm, suspicious for renal cell carcinoma - Urology consulted from ED, no intervention recommended   # Encephalopathy  - waxing and waning mental status per primary  - likely 2/2 above but also concerns for septic brain infarcts due to staph bacteremia   # A Fib w RVR  - Cardiology following  - on IV amiodarone   #  ESRD on HD  - no concerns at AVF site per RN  Recommendations  - DC vancomycin , ceftriaxone .  Will start IV cefazolin  for MSSA bacteremia - 2 sets of repeat blood cultures ordered for 1/6 - Follow-up TTE.  If negative, needs TEE - Needs line holiday at least 48-72 hrs to be coordinated with nephrology depending on HD schedule - Monitor AV fistula site for any signs of infection or any other sites of metastatic infection including amputation sites. Per family members in room, no reported back pain or joint pain - Need to consider MRI brain if continues to be altered/no improvement  - Patient getting transferred to [facility] for higher level of care, would benefit from formal Urology evaluation given also concerns for renal cell ca .  - Universal/standard isolation precautions D/W primary team including RN   Rest of the management as per the primary  team. Please call with questions or concerns.  Thank you for the consult  __________________________________________________________________________________________________________ HPI and Hospital Course: 69 year old female with prior history as below including Obesity, DM 2, HTN, HLD, CHF, paroxysmal A-fib, PVD s/p bilateral BKA, amputation of the left hand, ESRD on HD who presented to the ED on 1/2 via EMS from dialysis center with generalized weakness, concern for AMS/somnolence.  Paramedics reported sore throat, general fatigue.  At ED reported mild upper respiratory tract infection several days ago with a sore throat, stuffy head which has resolved.  No reported fever, chills, chest pain, shortness of breath, nausea vomiting or diarrhea.   At ED, she was not altered or confused, but febrile, reported mild abdominal pain, nausea, mild SOB, chest discomfort.  Labs remarkable for BG 224, creatinine 4.82, ALP 140, albumin  3.0, troponin 595, lactic acid 2.6, WBC 12.2, hemoglobin 8.7, platelets 276  RVP negative.  Hepatitis B surface antigen NR MRSA  PCR negative   1/4 2/2 sets GPC.  BCID as MSSA  CT CAP  IMPRESSION: 1. Diffuse circumferential bladder wall thickening with surrounding stranding and a 3 mm punctate calcification at the left UVJ without significant left hydroureteronephrosis, most consistent with acute cystitis/ureteritis with a distal ureteral stone follow-up urinalysis/culture. 2. Stable 6 mm hyperdense filling defect with internal calcifications in the right renal pelvis, indeterminate (differential includes nonobstructing calculus, sloughed papilla, blood clot, or upper tract urothelial neoplasm). 3. Slight interval increase in size of the enhancing right lower pole renal lesion to 1.9 x 1.6 cm, suspicious for renal cell carcinoma (differential includes oncocytoma and lipid-poor angiomyolipoma); recommend urology consultation and renal protocol MRI (preferred) or CT without and with contrast for staging/characterization and management planning. 4. Aortic atherosclerosis and coronary artery calcifications.    Was given IVF, IV ceftriaxone  Urology consulted, no urology intervention needed Mental status again became lethargic Started on BiPAP Nephrology consulted for urgent HD  ROS: unavailable. Patient is on BIPAP.   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  Last Assessment & Plan:   Multiple polyps with surveillance due in 2021.  IMO SNOMED Dx Update Oct 2024     History of pneumonia 2019   Hypoxia 04/03/2019   Memory loss    Mixed hyperlipidemia    Not currently working due to disabled status 06/13/2018   Formatting of this note might be different from the original.  Last Assessment & Plan:   Formatting of this note might be different from the original.  Reports being  disabled since April/May 2019 ,  hospitalized then in a niursing home, now on dialysis and is unable to work     Obesity    Pancreatitis    Peritoneal dialysis catheter in place    Pneumonia due to COVID-19 virus 04/02/2019   Sleep apnea    Noncompliant with CPAP   Stroke (HCC)    mini stroke   Type 2 diabetes mellitus (HCC)    Past Surgical History:  Procedure Laterality Date   ABDOMINAL AORTOGRAM W/LOWER EXTREMITY N/A 04/30/2022   Procedure: ABDOMINAL AORTOGRAM W/LOWER EXTREMITY;  Surgeon: Magda Debby SAILOR, MD;  [facility];  Service: Cardiovascular;  Laterality: N/A;   ABDOMINAL AORTOGRAM W/LOWER EXTREMITY N/A 07/21/2022   Procedure: ABDOMINAL AORTOGRAM W/LOWER EXTREMITY;  Surgeon: Serene Gaile ORN, MD;  [facility]: [facility];  Service: Cardiovascular;  Laterality: N/A;   ABDOMINAL HYSTERECTOMY     ACHILLES TENDON LENGTHENING  08/15/2022   Procedure: ACHILLES TENDON LENGTHENING;  Surgeon: Silva Juliene SAUNDERS, DPM;  Location: [facility] OR;  Service: Podiatry;;   AMPUTATION Right 05/29/2022   Procedure: RIGHT BELOW THE KNEE AMPUTATION;  Surgeon: Harden Jerona GAILS, MD;  Location: [facility] OR;  Service: Orthopedics;  Laterality: Right;   AMPUTATION Left 09/04/2022   Procedure: AMPUTATION FOOT, serial irrigation;  Surgeon: Joya Stabs, DPM;  Location: [facility] OR;  Service: Podiatry;  Laterality: Left;  Surgical team  to do block   AMPUTATION Left 10/07/2022   Procedure: LEFT BELOW KNEE AMPUTATION;  Surgeon: Harden Jerona GAILS, MD;  Location: [facility] OR;  Service: Orthopedics;  Laterality: Left;   AMPUTATION Left 02/03/2024   Procedure: AMPUTATION, HAND;  Surgeon: Arlinda Buster, MD;  Location: [facility] OR;  Service: Orthopedics;  Laterality: Left;  LEFT HAND AMPUTATION   AMPUTATION FINGER Left 09/29/2023   Procedure: AMPUTATION, FINGER;  Surgeon: Harden Jerona GAILS, MD;  Location: [facility] OR;  Service: Orthopedics;  Laterality: Left;  LEFT HAND LONG FINGER RAY AMPUTATION   AV FISTULA PLACEMENT Left 09/02/2017   Procedure: creation of left arm ARTERIOVENOUS (AV) FISTULA;  Surgeon: Serene Gaile ORN, MD;  Location: [facility] OR;  Service: Vascular;  Laterality: Left;   COLONOSCOPY  2008   Dr. Harvey: normal    COLONOSCOPY N/A 12/18/2016   Dr. Harvey: multiple tubular adenomas, internal hemorrhoids. Surveillance in 3 years    COLONOSCOPY N/A 12/05/2023   Procedure: COLONOSCOPY;  Surgeon: Cindie Carlin POUR, DO;  [facility];  Service: Endoscopy;  Laterality: N/A;   COLONOSCOPY N/A 12/17/2023   Procedure: COLONOSCOPY;  Surgeon: Shaaron Lamar HERO, MD;  [facility];  Service: Endoscopy;  Laterality: N/A;   COLONOSCOPY N/A 01/01/2024   Procedure: COLONOSCOPY;  Surgeon: Cindie Carlin POUR, DO;  [facility];  Service: Endoscopy;  Laterality: N/A;   DIALYSIS/PERMA CATHETER INSERTION N/A 03/17/2024   Procedure: DIALYSIS/PERMA CATHETER INSERTION;  Surgeon: Magda Debby SAILOR, MD;  [facility];  Service: Cardiovascular;  Laterality: N/A;   ESOPHAGEAL DILATION N/A 10/13/2015   Procedure: ESOPHAGEAL DILATION;  Surgeon: Claudis RAYMOND Rivet, MD;  [facility];  Service: Endoscopy;  Laterality: N/A;   ESOPHAGEAL DILATION N/A 12/03/2023   Procedure: DILATION, ESOPHAGUS;  Surgeon: Cindie Carlin POUR, DO;  [facility];  Service: Endoscopy;  Laterality: N/A;   ESOPHAGOGASTRODUODENOSCOPY N/A 10/13/2015    Dr. Rivet: chronic gastritis on path, no H.pylori. Empiric dilation    ESOPHAGOGASTRODUODENOSCOPY N/A 12/18/2016   Dr. Harvey: mild gastritis. BRAVO study revealed uncontrolled GERD. Dysphagia secondary to uncontrolled reflux   ESOPHAGOGASTRODUODENOSCOPY N/A  12/03/2023   Procedure: EGD (ESOPHAGOGASTRODUODENOSCOPY);  Surgeon: Cindie Carlin POUR, DO;  [facility];  Service: Endoscopy;  Laterality: N/A;   FOOT SURGERY Bilateral    nerve     INCISION AND DRAINAGE OF WOUND Left 10/30/2023   Procedure: IRRIGATION AND DEBRIDEMENT WOUND;  Surgeon: Arlinda Buster, MD;  [facility]: [facility];  Service: Orthopedics;  Laterality: Left;  LEFT HAND WOUND   INSERTION OF DIALYSIS CATHETER N/A 02/03/2024   Procedure: INSERTION OF DIALYSIS CATHETER;  Surgeon: Magda Debby SAILOR, MD;  [facility];  Service: Vascular;  Laterality: N/A;   LEFT HEART CATH AND CORONARY ANGIOGRAPHY N/A 12/29/2018   Procedure: LEFT HEART CATH AND CORONARY ANGIOGRAPHY;  Surgeon: Dann Candyce RAMAN, MD;  Location: [facility] INVASIVE CV LAB;  Service: Cardiovascular;  Laterality: N/A;   LIGATION OF ARTERIOVENOUS  FISTULA Left 02/03/2024   Procedure: LIGATION OF ARTERIOVENOUS  FISTULA;  Surgeon: Magda Debby SAILOR, MD;  Location: [facility] OR;  Service: Vascular;  Laterality: Left;   LOWER EXTREMITY ANGIOGRAPHY Right 05/04/2022   Procedure: Lower Extremity Angiography;  Surgeon: Lanis Fonda BRAVO, MD;  Location: [facility] INVASIVE CV LAB;  Service: Cardiovascular;  Laterality: Right;   LUNG BIOPSY     MASS EXCISION Right 01/09/2013   Procedure: EXCISION OF NEOPLASM OF RIGHT  AXILLA  AND EXCISION OF NEOPLASM OF LEFT AXILLA;  Surgeon: Oneil DELENA Budge, MD;  Location: [facility];  Service: General;  Laterality: Right;  procedure end @ 08:23   MYRINGOTOMY WITH TUBE PLACEMENT Bilateral 04/28/2017   Procedure: BILATERAL MYRINGOTOMY WITH TUBE PLACEMENT;  Surgeon: Karis Clunes, MD;  [facility];  Service: ENT;  Laterality: Bilateral;   PERIPHERAL VASCULAR BALLOON  ANGIOPLASTY Right 05/04/2022   Procedure: PERIPHERAL VASCULAR BALLOON ANGIOPLASTY;  Surgeon: Lanis Fonda BRAVO, MD;  Location: [facility] INVASIVE CV LAB;  Service: Cardiovascular;  Laterality: Right;  PT   PERIPHERAL VASCULAR INTERVENTION Right 05/04/2022   Procedure: PERIPHERAL VASCULAR INTERVENTION;  Surgeon: Lanis Fonda BRAVO, MD;  Location: [facility] INVASIVE CV LAB;  Service: Cardiovascular;  Laterality: Right;  SFA   PERIPHERAL VASCULAR INTERVENTION Left 07/21/2022   Procedure: PERIPHERAL VASCULAR INTERVENTION;  Surgeon: Serene Gaile ORN, MD;  [facility];  Service: Cardiovascular;  Laterality: Left;   REVISION OF ARTERIOVENOUS GORETEX GRAFT Left 05/04/2018   Procedure: TRANSPOSITION OF CEPHALIC VEIN ARTERIOVENOUS FISTULA LEFT ARM;  Surgeon: Oris Krystal FALCON, MD;  Location: [facility];  Service: Vascular;  Laterality: Left;   SAVORY DILATION N/A 12/18/2016   Procedure: SAVORY DILATION;  Surgeon: Harvey Margo CROME, MD;  [facility];  Service: Endoscopy;  Laterality: N/A;   TRANSMETATARSAL AMPUTATION Left 08/15/2022   Procedure: TRANSMETATARSAL AMPUTATION;  Surgeon: Silva Juliene SAUNDERS, DPM;  Location: [facility];  Service: Podiatry;  Laterality: Left;   Scheduled Meds:  Chlorhexidine  Gluconate Cloth  6 each Topical Q0600   Chlorhexidine  Gluconate Cloth  6 each Topical Q0600   heparin   5,000 Units Subcutaneous Q8H   insulin  aspart  0-9 Units Subcutaneous Q4H   Continuous Infusions:  sodium chloride      amiodarone  30 mg/hr (03/27/24 0750)   amiodarone      anticoagulant sodium citrate      cefTRIAXone  (ROCEPHIN )  IV     norepinephrine  (LEVOPHED ) Adult infusion 5 mcg/min (03/27/24 0720)   vancomycin      [START ON 04/21/2024] vancomycin      PRN Meds:.acetaminophen , alteplase , amiodarone , anticoagulant sodium citrate , feeding supplement (NEPRO CARB STEADY), heparin , lidocaine  (PF), lidocaine -prilocaine , melatonin, pentafluoroprop-tetrafluoroeth, polyethylene glycol,  prochlorperazine   Allergies[1]  Social History   Socioeconomic History   Marital status: Married    Spouse name: Ayako Tapanes   Number of children: 2   Years of education: 12   Highest education level: Associate degree: occupational, scientist, product/process development, or vocational program  Occupational History   Occupation: retired   Tobacco Use   Smoking status: Never    Passive exposure: Never   Smokeless tobacco: Never   Tobacco comments:    Verified by Daughter, [name]  Vaping Use   Vaping status: Never Used  Substance and Sexual Activity   Alcohol  use: No   Drug use: No   Sexual activity: Not Currently    Partners: Male  Other Topics Concern   Not on file  Social History Narrative   Lives alone with husband    Right handed   Caffeine-1/2 daily   Social Drivers of Health   Tobacco Use: Low Risk (03/26/2024)   Patient History    Smoking Tobacco Use: Never    Smokeless Tobacco Use: Never    Passive Exposure: Never  Financial Resource Strain: Low Risk (02/22/2024)   Overall Financial Resource Strain (CARDIA)    Difficulty of Paying Living Expenses: Not hard at all  Food Insecurity: No Food Insecurity (03/26/2024)   Epic    Worried About Programme Researcher, Broadcasting/film/video in the Last Year: Never true    Ran Out of Food in the Last Year: Never true  Transportation Needs: No Transportation Needs (03/26/2024)   Epic    Lack of Transportation (Medical): No    Lack of Transportation (Non-Medical): No  Physical Activity: Inactive (02/22/2024)   Exercise Vital Sign    Days of Exercise per Week: 0 days    Minutes of Exercise per Session: Not on file  Stress: Stress Concern Present (02/22/2024)   [employer] of Occupational Health - Occupational Stress Questionnaire    Feeling of Stress: To some extent  Social Connections: Moderately Integrated (03/26/2024)   Social Connection and Isolation Panel    Frequency of Communication with Friends and Family: More than three times a week    Frequency of  Social Gatherings with Friends and Family: More than three times a week    Attends Religious Services: 1 to 4 times per year    Active Member of Clubs or Organizations: No    Attends Banker Meetings: Never    Marital Status: Married  Catering Manager Violence: Not At Risk (03/26/2024)   Epic    Fear of Current or Ex-Partner: No    Emotionally Abused: No    Physically Abused: No    Sexually Abused: No  Depression (PHQ2-9): High Risk (02/22/2024)   Depression (PHQ2-9)    PHQ-2 Score: 14  Alcohol  Screen: Low Risk (05/03/2023)   Alcohol  Screen    Last Alcohol  Screening Score (AUDIT): 0  Housing: Low Risk (03/26/2024)   Epic    Unable to Pay for Housing in the Last Year: No    Number of Times Moved in the Last Year: 0    Homeless in the Last Year: No  Utilities: Not At Risk (03/26/2024)   Epic    Threatened with loss of utilities: No  Health Literacy: Adequate Health Literacy (05/03/2023)   B1300 Health Literacy    Frequency of need for help with medical instructions: Rarely  Recent Concern: Health Literacy - Inadequate Health Literacy (02/08/2023)   B1300 Health Literacy    Frequency of need for help with medical instructions: Often   Family History  Problem Relation Age of Onset   Hypertension Father    Hypercholesterolemia Father    Arthritis Father    Hypertension Sister    Hypercholesterolemia Sister    Breast cancer Sister    Hypertension Sister    Colon cancer Neg Hx    Colon polyps Neg Hx    Family History  Problem Relation Age of Onset   Hypertension Father    Hypercholesterolemia Father    Arthritis Father    Hypertension Sister    Hypercholesterolemia Sister    Breast cancer Sister    Hypertension Sister    Colon cancer Neg Hx    Colon polyps Neg Hx    Vitals BP (!) 120/92   Pulse (!) 108   Temp (!) 97.4 F (36.3 C) (Axillary)   Resp 19   Ht 5' 4 (1.626 m)   Wt 68.8 kg   SpO2 100%   BMI 26.04 kg/m   Physical Exam Sitting in the bed, on  BIPAP. Per RN, altered and does not follow commands.   Pertinent Microbiology Results for orders placed or performed during the hospital encounter of 03/26/24  Blood Culture (routine x 2)     Status: None (Preliminary result)   Collection Time: 03/26/24 10:21 AM   Specimen: BLOOD RIGHT FOREARM  Result Value Ref Range Status   Specimen Description   Final    BLOOD RIGHT FOREARM BLOOD Performed at [facility], [address], [place], [place] [address]    Special Requests   Final    BOTTLES DRAWN AEROBIC AND ANAEROBIC Blood Culture adequate volume Performed at [facility], [address] [place], [place] [address]    Culture  Setup Time   Final    GRAM POSITIVE COCCI IN BOTH AEROBIC AND ANAEROBIC BOTTLES CRITICAL RESULT CALLED TO, READ BACK BY AND VERIFIED WITH: KINDLY,C ON 03/27/24 AT 0208 BY PURDIE,J REVA GPC BY DD 03/27/2024 @ 0545 CRITICAL VALUE NOTED.  VALUE IS CONSISTENT WITH PREVIOUSLY REPORTED AND CALLED VALUE. Performed at [facility], [address] [place], [place] [address]    Culture GRAM POSITIVE COCCI  Final   Report Status PENDING  Incomplete  Blood Culture (routine x 2)     Status: None (Preliminary result)   Collection Time: 03/26/24 12:40 PM   Specimen: BLOOD  Result Value Ref Range Status   Specimen Description   Final    BLOOD LEFT ANTECUBITAL Performed at [facility], [address], [place], [place] [address]    Special Requests   Final    AEROBIC BOTTLE ONLY Blood Culture adequate volume Performed at [facility], [address], [place], [place] [address]    Culture  Setup Time   Final    GRAM POSITIVE COCCI AEROBIC BOTTLE ONLY CRITICAL RESULT CALLED TO, READ BACK BY AND VERIFIED WITH: KINDLY,C ON 03/27/24 AT 0208 BY PURDIE,J  REVA GPC BY DD 03/27/2024 @ 0538 Organism ID to follow  CODY [facility] CHARGE RN 03/27/2024 BY DD @ 0650 Performed at [facility], [address], [place], [place] [address]    Culture GRAM POSITIVE COCCI  Final    Report Status PENDING  Incomplete  Blood Culture ID Panel (Reflexed)     Status: Abnormal   Collection Time: 03/26/24 12:40 PM  Result Value Ref Range Status   Enterococcus faecalis NOT DETECTED NOT DETECTED Final   Enterococcus Faecium NOT DETECTED NOT DETECTED Final   Listeria monocytogenes NOT DETECTED NOT DETECTED Final   Staphylococcus species  DETECTED (A) NOT DETECTED Final    Comment: CRITICAL RESULT CALLED TO, READ BACK BY AND VERIFIED WITH:  CODY KINDLEY CHARGE RN 03/27/2024 BY DD @ 0650    Staphylococcus aureus (BCID) DETECTED (A) NOT DETECTED Final    Comment: CRITICAL RESULT CALLED TO, READ BACK BY AND VERIFIED WITH:  CODY KINDLEY CHARGE RN 03/27/2024 BY DD @ 0650    Staphylococcus epidermidis NOT DETECTED NOT DETECTED Final   Staphylococcus lugdunensis NOT DETECTED NOT DETECTED Final   Streptococcus species NOT DETECTED NOT DETECTED Final   Streptococcus agalactiae NOT DETECTED NOT DETECTED Final   Streptococcus pneumoniae NOT DETECTED NOT DETECTED Final   Streptococcus pyogenes NOT DETECTED NOT DETECTED Final   A.calcoaceticus-baumannii NOT DETECTED NOT DETECTED Final   Bacteroides fragilis NOT DETECTED NOT DETECTED Final   Enterobacterales NOT DETECTED NOT DETECTED Final   Enterobacter cloacae complex NOT DETECTED NOT DETECTED Final   Escherichia coli NOT DETECTED NOT DETECTED Final   Klebsiella aerogenes NOT DETECTED NOT DETECTED Final   Klebsiella oxytoca NOT DETECTED NOT DETECTED Final   Klebsiella pneumoniae NOT DETECTED NOT DETECTED Final   Proteus species NOT DETECTED NOT DETECTED Final   Salmonella species NOT DETECTED NOT DETECTED Final   Serratia marcescens NOT DETECTED NOT DETECTED Final   Haemophilus influenzae NOT DETECTED NOT DETECTED Final   Neisseria meningitidis NOT DETECTED NOT DETECTED Final   Pseudomonas aeruginosa NOT DETECTED NOT DETECTED Final   Stenotrophomonas maltophilia NOT DETECTED NOT DETECTED Final   Candida albicans NOT DETECTED NOT  DETECTED Final   Candida auris NOT DETECTED NOT DETECTED Final   Candida glabrata NOT DETECTED NOT DETECTED Final   Candida krusei NOT DETECTED NOT DETECTED Final   Candida parapsilosis NOT DETECTED NOT DETECTED Final   Candida tropicalis NOT DETECTED NOT DETECTED Final   Cryptococcus neoformans/gattii NOT DETECTED NOT DETECTED Final   Meth resistant mecA/C and MREJ NOT DETECTED NOT DETECTED Final    Comment: Performed at [facility], [address], [place], [place] [address]  MRSA Next Gen by PCR, Nasal     Status: None   Collection Time: 03/26/24  5:28 PM   Specimen: Nasal Mucosa; Nasal Swab  Result Value Ref Range Status   MRSA by PCR Next Gen NOT DETECTED NOT DETECTED Final    Comment: (NOTE) The GeneXpert MRSA Assay (FDA approved for NASAL specimens only), is one component of a comprehensive MRSA colonization surveillance program. It is not intended to diagnose MRSA infection nor to guide or monitor treatment for MRSA infections. Test performance is not FDA approved in patients less than 31 years old. Performed at [facility], [address], [place], [place] [address]   Respiratory (~20 pathogens) panel by PCR     Status: None   Collection Time: 03/26/24  5:28 PM   Specimen: Nasopharyngeal Swab; Respiratory  Result Value Ref Range Status   Adenovirus NOT DETECTED NOT DETECTED Final   Coronavirus 229E NOT DETECTED NOT DETECTED Final    Comment: (NOTE) The Coronavirus on the Respiratory Panel, DOES NOT test for the novel  Coronavirus (2019 nCoV)    Coronavirus HKU1 NOT DETECTED NOT DETECTED Final   Coronavirus NL63 NOT DETECTED NOT DETECTED Final   Coronavirus OC43 NOT DETECTED NOT DETECTED Final   Metapneumovirus NOT DETECTED NOT DETECTED Final   Rhinovirus / Enterovirus NOT DETECTED NOT DETECTED Final   Influenza A NOT DETECTED NOT DETECTED Final   Influenza B NOT DETECTED NOT DETECTED Final   Parainfluenza Virus 1 NOT  DETECTED NOT DETECTED Final   Parainfluenza  Virus 2 NOT DETECTED NOT DETECTED Final   Parainfluenza Virus 3 NOT DETECTED NOT DETECTED Final   Parainfluenza Virus 4 NOT DETECTED NOT DETECTED Final   Respiratory Syncytial Virus NOT DETECTED NOT DETECTED Final   Bordetella pertussis NOT DETECTED NOT DETECTED Final   Bordetella Parapertussis NOT DETECTED NOT DETECTED Final   Chlamydophila pneumoniae NOT DETECTED NOT DETECTED Final   Mycoplasma pneumoniae NOT DETECTED NOT DETECTED Final    Comment: Performed at [facility], [address], [place], [place] [address]   *Note: Due to a large number of results and/or encounters for the requested time period, some results have not been displayed. A complete set of results can be found in Results Review.   Pertinent Lab seen by me:    Latest Ref Rng & Units 03/27/2024    5:00 AM 03/26/2024   12:40 PM 03/17/2024    8:50 AM  CBC  WBC 4.0 - 10.5 K/uL 19.7  12.2    Hemoglobin 12.0 - 15.0 g/dL 8.0  8.7  8.8   Hematocrit 36.0 - 46.0 % 25.7  29.8  26.0   Platelets 150 - 400 K/uL 234  276        Latest Ref Rng & Units 03/27/2024    5:00 AM 03/26/2024   12:40 PM 03/17/2024    8:50 AM  CMP  Glucose 70 - 99 mg/dL 873  775  819   BUN 8 - 23 mg/dL 12  40  62   Creatinine 0.44 - 1.00 mg/dL 8.39  5.17  2.69   Sodium 135 - 145 mmol/L 138  142  145   Potassium 3.5 - 5.1 mmol/L 3.3  4.1  4.1   Chloride 98 - 111 mmol/L 95  101  108   CO2 22 - 32 mmol/L 28  22    Calcium  8.9 - 10.3 mg/dL 9.2  9.3    Total Protein 6.5 - 8.1 g/dL  6.3    Total Bilirubin 0.0 - 1.2 mg/dL  0.3    Alkaline Phos 38 - 126 U/L  140    AST 15 - 41 U/L  25    ALT 0 - 44 U/L  23      Pertinent Imagings/Other Imagings Plain films and CT images have been personally visualized and interpreted; radiology reports have been reviewed. Decision making incorporated into the Impression / Recommendations.  I personally spent a total of 80 minutes in the care of the patient today including preparing to see the patient,  getting/reviewing separately obtained history, counseling and educating, placing orders, referring and communicating with other health care professionals, documenting clinical information in the EHR, independently interpreting results, communicating results, and coordinating care.   Annalee Orem, MD Infectious Disease Physician [facility] Pager: [phone]      [1]  Allergies Allergen Reactions   Ace Inhibitors Anaphylaxis and Swelling   Penicillins Itching and Swelling    Has tolerated cefazolin  on multiple occasions    Statins Other (See Comments)    Elevated LFT's   Albuterol  Swelling   "

## 2024-03-27 NOTE — Procedures (Signed)
 Patient Name: Kathryn Beck  MRN: [id number]  Epilepsy Attending: Arlin MALVA Krebs  Referring Physician/Provider: Bryn Bernardino NOVAK, MD  Date: 03/27/2024 Duration: 23.01 mins  Patient history: 69 year old female with altered mental status.  EEG evaluate for seizure.  Level of alertness: Awake  AEDs during EEG study: None   Technical aspects: This EEG study was done with scalp electrodes positioned according to the 10-20 International system of electrode placement. Electrical activity was reviewed with band pass filter of 1-70Hz , sensitivity of 7 uV/mm, display speed of 71mm/sec with a 60Hz  notched filter applied as appropriate. EEG data were recorded continuously and digitally stored.  Video monitoring was available and reviewed as appropriate.  Description: EEG showed continuous generalized and maximal left left parieto-occipital region 3 to 6 Hz theta-delta slowing. Sharp waves were noted in left parieto-occipital region. Hyperventilation and photic stimulation were not performed.     ABNORMALITY -Sharp wave, left parieto-occipital region - Continuous slow, generalized and maximal left left parieto-occipital region  IMPRESSION: This study showed evidence of epileptogenicity and cortical dysfunction arising from left parieto-occipital region. Additionally there is generalized cerebral dysfunction (encephalopathy). No seizures were seen throughout the recording.   Dathan Attia O Kristian Hazzard

## 2024-03-27 NOTE — Progress Notes (Signed)
 EEG complete - results pending

## 2024-03-27 NOTE — Procedures (Signed)
 Pre procedural Dx: ESRD, concern for line infection Post procedural Dx: Same  Discussed procedure with patient's granddaughter at bedside and daughter via phone, they give verbal consent to proceed.  Successful removal of tunneled right internal jugular HD catheter. Catheter and cuff removed in tact.  Hemostasis achieved with manual pressure and sterile dressing applied.  If bleeding occurs from insertion site: - Hold firm pressure at insertion site and right internal jugular for 20 minutes. Repeat x 1 if bleeding persists. - If bleeding persists despite above consider DDAVP and notify on call IR MD  EBL < 5 mL No immediate complications.  Please see imaging section of Epic for full dictation.  Clotilda DELENA Hesselbach PA-C 03/27/2024 3:32 PM

## 2024-03-28 DIAGNOSIS — B9561 Methicillin susceptible Staphylococcus aureus infection as the cause of diseases classified elsewhere: Secondary | ICD-10-CM

## 2024-03-28 DIAGNOSIS — T80211D Bloodstream infection due to central venous catheter, subsequent encounter: Secondary | ICD-10-CM

## 2024-03-28 DIAGNOSIS — I5082 Biventricular heart failure: Secondary | ICD-10-CM | POA: Diagnosis not present

## 2024-03-28 DIAGNOSIS — G9341 Metabolic encephalopathy: Secondary | ICD-10-CM

## 2024-03-28 DIAGNOSIS — N390 Urinary tract infection, site not specified: Secondary | ICD-10-CM | POA: Diagnosis not present

## 2024-03-28 DIAGNOSIS — N186 End stage renal disease: Secondary | ICD-10-CM | POA: Diagnosis not present

## 2024-03-28 DIAGNOSIS — A419 Sepsis, unspecified organism: Secondary | ICD-10-CM | POA: Diagnosis not present

## 2024-03-28 DIAGNOSIS — Z992 Dependence on renal dialysis: Secondary | ICD-10-CM | POA: Diagnosis not present

## 2024-03-28 DIAGNOSIS — D631 Anemia in chronic kidney disease: Secondary | ICD-10-CM

## 2024-03-28 DIAGNOSIS — R6521 Severe sepsis with septic shock: Secondary | ICD-10-CM | POA: Diagnosis not present

## 2024-03-28 DIAGNOSIS — A4101 Sepsis due to Methicillin susceptible Staphylococcus aureus: Secondary | ICD-10-CM | POA: Diagnosis not present

## 2024-03-28 LAB — CBC
HCT: 22.1 % — ABNORMAL LOW (ref 36.0–46.0)
Hemoglobin: 7 g/dL — ABNORMAL LOW (ref 12.0–15.0)
MCH: 23.8 pg — ABNORMAL LOW (ref 26.0–34.0)
MCHC: 31.7 g/dL (ref 30.0–36.0)
MCV: 75.2 fL — ABNORMAL LOW (ref 80.0–100.0)
Platelets: 164 K/uL (ref 150–400)
RBC: 2.94 MIL/uL — ABNORMAL LOW (ref 3.87–5.11)
RDW: 21.3 % — ABNORMAL HIGH (ref 11.5–15.5)
WBC: 15.2 K/uL — ABNORMAL HIGH (ref 4.0–10.5)
nRBC: 0.3 % — ABNORMAL HIGH (ref 0.0–0.2)

## 2024-03-28 LAB — MAGNESIUM: Magnesium: 1.8 mg/dL (ref 1.7–2.4)

## 2024-03-28 LAB — BASIC METABOLIC PANEL WITH GFR
Anion gap: 33 — ABNORMAL HIGH (ref 5–15)
Anion gap: 28 — ABNORMAL HIGH (ref 5–15)
BUN: 27 mg/dL — ABNORMAL HIGH (ref 8–23)
BUN: 32 mg/dL — ABNORMAL HIGH (ref 8–23)
CO2: 16 mmol/L — ABNORMAL LOW (ref 22–32)
CO2: 17 mmol/L — ABNORMAL LOW (ref 22–32)
Calcium: 8.7 mg/dL — ABNORMAL LOW (ref 8.9–10.3)
Calcium: 10 mg/dL (ref 8.9–10.3)
Chloride: 81 mmol/L — ABNORMAL LOW (ref 98–111)
Chloride: 92 mmol/L — ABNORMAL LOW (ref 98–111)
Creatinine, Ser: 2.87 mg/dL — ABNORMAL HIGH (ref 0.44–1.00)
Creatinine, Ser: 3.35 mg/dL — ABNORMAL HIGH (ref 0.44–1.00)
GFR, Estimated: 17 mL/min — ABNORMAL LOW
GFR, Estimated: 14 mL/min — ABNORMAL LOW
Glucose, Bld: 720 mg/dL (ref 70–99)
Glucose, Bld: 331 mg/dL — ABNORMAL HIGH (ref 70–99)
Potassium: 3.4 mmol/L — ABNORMAL LOW (ref 3.5–5.1)
Potassium: 3.6 mmol/L (ref 3.5–5.1)
Sodium: 129 mmol/L — ABNORMAL LOW (ref 135–145)
Sodium: 138 mmol/L (ref 135–145)

## 2024-03-28 LAB — RENAL FUNCTION PANEL
Albumin: 2.7 g/dL — ABNORMAL LOW (ref 3.5–5.0)
Anion gap: 33 — ABNORMAL HIGH (ref 5–15)
BUN: 27 mg/dL — ABNORMAL HIGH (ref 8–23)
CO2: 16 mmol/L — ABNORMAL LOW (ref 22–32)
Calcium: 8.8 mg/dL — ABNORMAL LOW (ref 8.9–10.3)
Chloride: 81 mmol/L — ABNORMAL LOW (ref 98–111)
Creatinine, Ser: 2.89 mg/dL — ABNORMAL HIGH (ref 0.44–1.00)
GFR, Estimated: 17 mL/min — ABNORMAL LOW
Glucose, Bld: 732 mg/dL (ref 70–99)
Phosphorus: 3.8 mg/dL (ref 2.5–4.6)
Potassium: 3.4 mmol/L — ABNORMAL LOW (ref 3.5–5.1)
Sodium: 130 mmol/L — ABNORMAL LOW (ref 135–145)

## 2024-03-28 LAB — GLUCOSE, CAPILLARY
Glucose-Capillary: 188 mg/dL — ABNORMAL HIGH (ref 70–99)
Glucose-Capillary: 199 mg/dL — ABNORMAL HIGH (ref 70–99)
Glucose-Capillary: 204 mg/dL — ABNORMAL HIGH (ref 70–99)
Glucose-Capillary: 177 mg/dL — ABNORMAL HIGH (ref 70–99)
Glucose-Capillary: 193 mg/dL — ABNORMAL HIGH (ref 70–99)

## 2024-03-28 LAB — PHOSPHORUS: Phosphorus: 3.8 mg/dL (ref 2.5–4.6)

## 2024-03-28 LAB — ECHOCARDIOGRAM COMPLETE
AR max vel: 0.64 cm2
AV Area VTI: 0.58 cm2
AV Area mean vel: 0.67 cm2
AV Mean grad: 21 mmHg
AV Peak grad: 36 mmHg
Ao pk vel: 3 m/s
Area-P 1/2: 5.97 cm2
Est EF: 20
Height: 64 in
S' Lateral: 3.6 cm
Single Plane A4C EF: 43.9 %
Weight: 2426.82 [oz_av]

## 2024-03-28 LAB — AMMONIA: Ammonia: 30 umol/L (ref 9–35)

## 2024-03-28 LAB — HEPATITIS B SURFACE ANTIBODY, QUANTITATIVE: Hep B S AB Quant (Post): 331 m[IU]/mL

## 2024-03-28 LAB — AMYLASE: Amylase: 100 U/L (ref 28–100)

## 2024-03-28 MED ORDER — POLYVINYL ALCOHOL 1.4 % OP SOLN: 1.0000 [drp] | Freq: Four times a day (QID) | OPHTHALMIC | Status: DC | PRN

## 2024-03-28 MED ORDER — ACETAMINOPHEN 325 MG PO TABS: 650.0000 mg | ORAL_TABLET | Freq: Four times a day (QID) | ORAL | Status: DC | PRN

## 2024-03-28 MED ORDER — CEFAZOLIN SODIUM-DEXTROSE 1-4 GM/50ML-% IV SOLN
1.0000 g | INTRAVENOUS | Status: DC
  Filled 2024-03-28 (×2): qty 50

## 2024-03-28 MED ORDER — THIAMINE MONONITRATE 100 MG PO TABS: 100.0000 mg | ORAL_TABLET | Freq: Every day | ORAL | Status: DC

## 2024-03-28 MED ORDER — GLYCOPYRROLATE 0.2 MG/ML IJ SOLN: 0.2000 mg | INTRAMUSCULAR | Status: DC | PRN

## 2024-03-28 MED ORDER — SODIUM CHLORIDE 0.9 % IV SOLN: INTRAVENOUS | Status: AC

## 2024-03-28 MED ORDER — METOPROLOL TARTRATE 5 MG/5ML IV SOLN
5.0000 mg | Freq: Once | INTRAVENOUS | Status: AC
  Administered 2024-03-28: 5 mg via INTRAVENOUS
  Filled 2024-03-28: qty 5

## 2024-03-28 MED ORDER — FENTANYL CITRATE (PF) 50 MCG/ML IJ SOSY: 25.0000 ug | PREFILLED_SYRINGE | INTRAMUSCULAR | Status: DC | PRN

## 2024-03-28 MED ORDER — GLYCOPYRROLATE 1 MG PO TABS: 1.0000 mg | ORAL_TABLET | ORAL | Status: DC | PRN

## 2024-03-28 MED ORDER — GLYCOPYRROLATE 0.2 MG/ML IJ SOLN
0.2000 mg | INTRAMUSCULAR | Status: DC | PRN
  Filled 2024-03-28: qty 1

## 2024-03-28 MED ORDER — PROSOURCE TF20 ENFIT COMPATIBL EN LIQD: 60.0000 mL | Freq: Every day | ENTERAL | Status: DC

## 2024-03-28 MED ORDER — OSMOLITE 1.5 CAL PO LIQD
1000.0000 mL | ORAL | Status: DC
  Filled 2024-03-28: qty 1000

## 2024-03-28 MED ORDER — METOPROLOL TARTRATE 5 MG/5ML IV SOLN
5.0000 mg | Freq: Four times a day (QID) | INTRAVENOUS | Status: DC | PRN
  Administered 2024-03-28 (×2): 5 mg via INTRAVENOUS
  Filled 2024-03-28 (×2): qty 5

## 2024-03-28 MED ORDER — ACETAMINOPHEN 650 MG RE SUPP: 650.0000 mg | Freq: Four times a day (QID) | RECTAL | Status: DC | PRN

## 2024-03-28 MED ORDER — RENA-VITE PO TABS: 1.0000 | ORAL_TABLET | Freq: Every day | ORAL | Status: DC

## 2024-03-28 MED ORDER — INSULIN GLARGINE-YFGN 100 UNIT/ML ~~LOC~~ SOLN
10.0000 [IU] | Freq: Every day | SUBCUTANEOUS | Status: DC
  Administered 2024-03-28: 10 [IU] via SUBCUTANEOUS
  Filled 2024-03-28 (×3): qty 0.1

## 2024-03-28 NOTE — Progress Notes (Signed)
 Noted that pt has been transferred to ICU at [facility]. Contacted [facility] and provided this update. Will continue to assist.   Nickolis Diel Dialysis navigator [phone]

## 2024-03-28 NOTE — Plan of Care (Signed)
" °  Problem: Clinical Measurements: Goal: Will remain free from infection Outcome: Progressing   Problem: Nutrition: Goal: Adequate nutrition will be maintained Outcome: Progressing   Problem: Coping: Goal: Level of anxiety will decrease Outcome: Progressing   Problem: Pain Managment: Goal: General experience of comfort will improve and/or be controlled Outcome: Progressing   Problem: Education: Goal: Knowledge of General Education information will improve Description: Including pain rating scale, medication(s)/side effects and non-pharmacologic comfort measures Outcome: Not Progressing   Problem: Health Behavior/Discharge Planning: Goal: Ability to manage health-related needs will improve Outcome: Not Progressing   Problem: Clinical Measurements: Goal: Ability to maintain clinical measurements within normal limits will improve Outcome: Not Progressing   Problem: Activity: Goal: Risk for activity intolerance will decrease Outcome: Not Progressing   "

## 2024-03-28 NOTE — Progress Notes (Addendum)
 "  Progress Note  Patient Name: Kathryn Beck Date of Encounter: 03/28/2024 [facility] Cardiologist: Jayson Sierras, MD   Interval Summary   Admitted with urosepsis and bacteremia with staph.  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  She has type 2 diabetes chronic HFpEF, aortic stenosis, OSA, ESRD on hemodialysis Monday Wednesday Friday, bilateral adrenal adenoma, paroxysmal atrial fibrillation status post DCCV 11/10/2023.  She has previously had a normal cardiac catheterization in October 2020 and an echocardiogram in March 2025 showed preserved EF.  History obtained from her daughter at the bedside.  She presented to [facility], ED admitted for sepsis due to UTI with acute metabolic encephalopathy and acute respiratory distress requiring BiPAP.  She has atrial fibrillation with rapid ventricular response and is not on anticoagulation due to recent GI bleeding.  IV amiodarone  initiated cautiously with risk-benefit discussed with family.  She remains in the medical ICU on norepinephrine  for blood pressure support with persistent hypotension and atrial fibrillation with rates between 120-140 difficult to control despite amiodarone .  Echocardiogram obtained yesterday demonstrates ejection fraction of 20%, elevated left atrial pressure, and possible severe low-flow low gradient AS.  On exam she remains on BiPAP and is encephalopathic.  Vital Signs Vitals:   03/28/24 1120 03/28/24 1130 03/28/24 1145 03/28/24 1225  BP:  110/72 134/82   Pulse: (!) 109 79 (!) 117   Resp: 16 16 (!) 21   Temp:    98.1 F (36.7 C)  TempSrc:    Axillary  SpO2: 100% 100% 100%   Weight:      Height:        Intake/Output Summary (Last 24 hours) at 03/28/2024 1322 Last data filed at 03/28/2024 1100 Gross per 24 hour  Intake 1189.83 ml  Output --  Net 1189.83 ml      03/28/2024    5:45 AM 03/27/2024    8:10 AM 03/27/2024    1:30 AM  Last  3 Weights  Weight (lbs) 151 lb 14.4 oz 151 lb 10.8 oz 156 lb 12 oz  Weight (kg) 68.9 kg 68.8 kg 71.1 kg      Telemetry/ECG  Afib rvr rates 120s. - Personally Reviewed  Physical Exam  GEN: Somnolent, unresponsive Neck: No JVD Cardiac: Irregular and tachycardic no definite murmurs Respiratory: Clear to auscultation bilaterally. GI: Soft, nontender, non-distended  MS: No edema and bilateral BKA stumps, stumps are cool to the touch.  Assessment & Plan  Kathryn Beck is a 69 y.o. female with a hx of  PAD (, HTN, HLD, Type 2 DM, bilateral adrenal adenoma, chronic HFpEF, aortic stenosis, OSA, ESRD on HD, history of TIA's, paroxysmal atrial fibrillation s/p DCCV in 10/2023 who presented to [facility] on 1/4 for AMS. Lactic acid 2.6 ->3.5. She was admitted with sepsis. She did require BiPAP and urgent hemodialysis. Blood cultures grew MSSA. C/ by AF RVR.She was transferred to [facility] for further evaluation and treatment.   Biventricular heart failure -Patient has a newly reduced ejection fraction with at least moderate RV dysfunction.  Likely related to critical illness given no prior note of coronary disease on cath in 2020. -She is on norepinephrine  for blood pressure support, may need to consider inotrope as well. -Cool on exam with low stroke-volume on echo, and volume is managed by dialysis, had urgent dialysis 03/27/2024 with 2.5 L removed, appears volume even today. -Poor prognosis overall given multiple comorbidities, not  likely a candidate for advanced heart failure therapies. -Conversations about palliative care have been had, this seems most appropriate.  Atrial Fibrillation, with episode of RVR Currently in Afib RVR rates 120s. Chad Vas score 7 Not on anticoagulation with GI bleed history.  Keep K > 4 and Mag > 2  Continue IV amiodarone . No other good options at this time. Avoiding digoxin with renal disease.   Elevated Troponin 595 -> 662-> 617 CT shows coronary calcifications, though LHC  12/2018 showed no significant CAD Echo shows reduced EF, troponin most likely related to demand ischemia with critical illness.  Aortic stenosis Echo shows possible severe low flow low gradient AS. She is not an intervention candidate currently.   PAD  s/p R BKA in 05/2022, and s/p L BKA in 09/2022  Per primary Septic shock on vasopressors Acute metabolic encephalopathy with possible septic brain infarct OSA not on CPAP ESRD on HD T2DM  Poor prognosis, on pressors but remains hypotensive, severe AS and severely reduced EF. Consider palliative care consult.   CRITICAL CARE Performed by: Soyla Merck, MD   Total critical care time: 45 minutes   Critical care time was exclusive of separately billable procedures and treating other patients.   Critical care was necessary to treat or prevent imminent or life-threatening deterioration.   Critical care was time spent personally by me (independent of APPs or residents) on the following activities: development of treatment plan with patient and/or surrogate as well as nursing, discussions with consultants, evaluation of patient's response to treatment, examination of patient, obtaining history from patient or surrogate, ordering and performing treatments and interventions, ordering and review of laboratory studies, ordering and review of radiographic studies, pulse oximetry and re-evaluation of patient's condition.   For questions or updates, please contact [facility] Please consult [email or web address] for contact info under       Signed, Soyla DELENA Merck, MD   "

## 2024-03-28 NOTE — Progress Notes (Signed)
 Patient transferred to [facility]. [facility] ID team notified for ID follow up.   Annalee Orem, MD Infectious Disease Physician [facility] [address] [place], [place] [address] Phone: [phone]  Fax: [phone]

## 2024-03-28 NOTE — Progress Notes (Signed)
 [facility] Progress Note  Subjective:  Pt seen in ICU Is on bipap Is not responding at this time Pt is now DNR-Limited  Presentation summary: 69 y.o. female with a past medical history of ESRD who presents with confusion. Patient was notably admitted in November requiring hand amputation 2/2 steal associated with an HD access. She had a catheter placed and was using this for HD. Her catheter became dislodged and she was seen in the hospital around 12/24. Her catheter was exchanged during that time. She was feeling well per husband until Sunday morning when she started acting abnormally. She was lethargic and not answering questions. In the ER she was more alert but was found to have a fever and concerning signs of CXR and CT scan for infection. She was started on antibiotics and admitted for further mgmt. Due to some hypoxia she was given dialysis overnight at [facility] but had some hypotension requiring levophed . Bcx returned + for GPCs.  Vitals:   03/28/24 1120 03/28/24 1130 03/28/24 1145 03/28/24 1225  BP:  110/72 134/82   Pulse: (!) 109 79 (!) 117   Resp: 16 16 (!) 21   Temp:    98.1 F (36.7 C)  TempSrc:    Axillary  SpO2: 100% 100% 100%   Weight:      Height:        Exam: General: Lying in bed in no distress, on bipap, eyes closed and not responsive to voice CV: RRR, no R/G  Lungs: bilateral chest rise, normal wob on BiPAP Abd: soft, non-tender, non-distended Skin: no visible lesions or rashes Psych: not interactive, unable to assess mood and affect Neuro: as above  CXR 1/05 - persistent CM, no edema or vasc congestion  Home BP meds Norvasc  Metoprolol  XL    OP HD: [facility] MWF  Nov 2025 -> 3.5h   B400  70.5kg   2K bath   AVF   Hep none    Assessment/ Plan:  # ESRD - had HD in ICU Sunday night - next HD likely 1/08 after cath replaced   # Volume - some concern for volume excess on admission - got off 2.5 L w/ HD Sunday night - lungs clear  today on exam  # MSSA bacteremia - likely HD catheter related bacteremia - TDC removed 1/05, replace on 1/08  # BP - bp's labile here, low to low-normal - home bp meds (BB, norvasc ) on hold   # Anemia esrd - Hgb 7- 9 here  - don't give IV iron given infection - follow trend and give esa if needed   # Shock d/t sepsis - on low dose levo gtt 2-3 mcg/ min   # DNR- Limited     Rob Geralynn MD  [facility] 03/28/2024, 12:41 PM  Recent Labs  Lab 03/27/24 2016 03/27/24 2133 03/28/24 0343 03/28/24 0345 03/28/24 0613  HGB 7.2* 14.6 7.0*  --   --   ALBUMIN  3.0*  --   --  2.7*  --   CALCIUM  9.5  --  8.7* 8.8* 10.0  PHOS 3.7  --  3.8 3.8  --   CREATININE 2.86*  --  2.87* 2.89* 3.35*  K 3.6 3.7 3.4* 3.4* 3.6   No results for input(s): IRON, TIBC, FERRITIN in the last 168 hours. Inpatient medications:  [START ON 04/09/2024] calcitRIOL   1 mcg Oral Q M,W,F-HD   Chlorhexidine  Gluconate Cloth  6 each Topical Q0600   [START ON 04/17/2024] cinacalcet   90 mg Oral  Q M,W,F   heparin   5,000 Units Subcutaneous Q8H   insulin  aspart  0-9 Units Subcutaneous Q4H   insulin  glargine-yfgn  10 Units Subcutaneous Daily   sucroferric oxyhydroxide  1,000 mg Oral TID WC   sucroferric oxyhydroxide  500 mg Oral With snacks    amiodarone  30 mg/hr (03/28/24 1100)   anticoagulant sodium citrate       ceFAZolin  (ANCEF ) IV     norepinephrine  (LEVOPHED ) Adult infusion 3 mcg/min (03/28/24 1100)   acetaminophen , alteplase , anticoagulant sodium citrate , feeding supplement (NEPRO CARB STEADY), heparin , lidocaine  (PF), lidocaine -prilocaine , melatonin, metoprolol  tartrate, pentafluoroprop-tetrafluoroeth, polyethylene glycol, prochlorperazine , senna

## 2024-03-28 NOTE — Progress Notes (Signed)
 Initial Nutrition Assessment  DOCUMENTATION CODES:  Not applicable  INTERVENTION:  Initiate tube feeding via NGT at trickle once in place. Exchange for cortrak 1/7. Once able to advance, recommend the following: Osmolite 1.5 at 45 ml/h (1080 ml per day) Prosource TF20 60 ml 1x/d Provides 1700 kcal, 88 gm protein, 823 ml free water  daily Renal MVI daily  NUTRITION DIAGNOSIS:  Inadequate oral intake related to inability to eat as evidenced by NPO status.  GOAL:  Patient will meet greater than or equal to 90% of their needs  MONITOR:  TF tolerance, I & O's, Labs  REASON FOR ASSESSMENT:  Consult Enteral/tube feeding initiation and management  ASSESSMENT:  Pt with hx of HTN, ESRD on HD, adrenal mass, CHF, DM type 2, atrial fibrillation, PVD with bilateral BKAs and LUE amputation presented to ED with AMS. Presentation consistent with sepsis related to UTI.  1/4 - presented to [facility] ED 1/5 - transferred to [facility] ICU  Pt discussed in rounds with MD. Currently requiring BiPAP and not able to take in PO. RN to place NGT this afternoon and MD requests trickles. Will exchange for cortrak tomorrow for higher level of comfort. Will obtain hx and physical exam at follow-up assessment.    Admit weight: 64.9 kg  Current weight: 68.9 kg   Outpatient HD: [facility]  EDW 70.5 kg    Intake/Output Summary (Last 24 hours) at 03/28/2024 1420 Last data filed at 03/28/2024 1100 Gross per 24 hour  Intake 1189.83 ml  Output --  Net 1189.83 ml  Net IO Since Admission: 1,320.06 mL [03/28/24 1420]  Drains/Lines: Left AV fistula  Nutritionally Relevant Medications: Scheduled Meds:  [START ON 04/03/2024] calcitRIOL   1 mcg Oral Q M,W,F-HD   [START ON 04/09/2024] cinacalcet   90 mg Oral Q M,W,F   insulin  aspart  0-9 Units Subcutaneous Q4H   insulin  glargine-yfgn  10 Units Subcutaneous Daily   sucroferric oxyhydroxide  1,000 mg Oral TID WC   Continuous Infusions:   ceFAZolin  (ANCEF )  IV     norepinephrine  (LEVOPHED ) Adult infusion 3 mcg/min (03/28/24 1100)   PRN Meds: polyethylene glycol, prochlorperazine , senna  Labs Reviewed: Chloride 92 BUN 32, creatinine 3.35 CBG ranges from 123-204 mg/dL over the last 24 hours HgbA1c 6.7% (10/27/23)  NUTRITION - FOCUSED PHYSICAL EXAM: Defer to in-person assessment  Diet Order:   Diet Order             Diet NPO time specified  Diet effective now                   EDUCATION NEEDS:  Not appropriate for education at this time  Skin:  Skin Assessment: Reviewed RN Assessment  Last BM:  1/5 - type 6  Height:  Ht Readings from Last 1 Encounters:  03/26/24 5' 4 (1.626 m)    Weight:  Wt Readings from Last 1 Encounters:  03/28/24 68.9 kg    Ideal Body Weight:  47.7 kg (adjusted by 12.6 for bilateral BKA and hand amputation)  BMI:  Body mass index is 26.07 kg/m.  Estimated Nutritional Needs:  Kcal:  1600-1800 kcal/d Protein:  80-95g/d Fluid:  1L + UOP    Kathryn Beck, RD, LDN, CNSC Registered Dietitian II Please reach out via secure chat

## 2024-03-28 NOTE — Progress Notes (Signed)
 "  NAME:  Kathryn Beck, MRN:  [id number], DOB:  November 25, 1955, LOS: 2 ADMISSION DATE:  03/26/2024, CONSULTATION DATE:  03/27/2024 REFERRING MD:  Bryn Bernardino NOVAK, MD  CHIEF COMPLAINT:  septic shock   History of Present Illness:  69 year old female with history of ESRD-HD (MWF) via tunneled HD placed 12/26, PVD s/p B/L BKAs, left hand amputation, HFpEF, HTN, DM2, obesity, PAF, who presented to [facility] on 1/4 with sepsis and AMS.  Patient also required BiPAP and urgent hemodialysis.  Blood cultures grew MSSA bacteremia Vanco changed to cefazolin  by ID.  Patient also went to A-fib RVR requiring amiodarone  gtt. Patient was transferred to [facility], ICU for septic shock requiring Levophed , and persistent encephalopathy & respiratory failure   Pertinent  Medical History  ESRD-HD, PVD s/p bilateral BKA's, left hand amputation, HFpEF, HTN, DM2, obesity, paroxysmal A-fib  Significant Hospital Events: Including procedures, antibiotic start and stop dates in addition to other pertinent events   Transferred from [facility] on 1/5 for septic shock requiring levo  Interim History / Subjective:  Remains on BIPAP Lethargic Opens eye to name calling A fib with RVR in 120-140s  Objective    Blood pressure 92/70, pulse 75, temperature 98.6 F (37 C), temperature source Axillary, resp. rate 20, height 5' 4 (1.626 m), weight 68.9 kg, SpO2 100%.    Vent Mode: BIPAP FiO2 (%):  [40 %-100 %] 40 % Set Rate:  [14 bmp] 14 bmp PEEP:  [8 cmH20] 8 cmH20   Intake/Output Summary (Last 24 hours) at 03/28/2024 1641 Last data filed at 03/28/2024 1600 Gross per 24 hour  Intake 1311.36 ml  Output --  Net 1311.36 ml   Filed Weights   03/27/24 0130 03/27/24 0810 03/28/24 0545  Weight: 71.1 kg 68.8 kg 68.9 kg    Examination: General: Critically ill, on BiPAP HENT: NCAT Lungs: Symmetrical chest wall movement, coarse bronchial sounds bilaterally, on BiPAP Cardiovascular: S1-S2 normal Abdomen: Soft nontender  nondistended Extremities: Bilateral BKA's, left hand amputation Neuro: lethargic, open eyes to  her name GU: Deferred  Resolved problem list   Assessment and Plan   69 year old female with history of ESRD-HD (MWF) PVD s/p B/L BKAs, left lower amputation, HFpEF, HTN, DM2, PAF who was transferred from [facility] for septic shock requiring Levophed , persistent encephalopathy and respiratory failure.  # Acute metabolic encephalopathy # A fib with RVR # septic shock # MSSA bacteremia, likely line related infection # ESRD on HD # HFpEF # OSA, DM # acute cystitis # chronic anemia  Plan: EEG of 1/5-no seizures appreciated Opens eyes to name calling but remains lethargic. CT head unimpressive HR uncontrolled despite amiodarone  drip, start iv metoprolol  Cardiology consulted, echo pending Continue cefazolin / d/c vancomycin  ABG reviewed. Continue BIPAP HD line removed- will replace one prior to next HD session. No urgent need for HD today- last HD 1/04 Place clotrak tomorrow FSG q 6 hrs with SS   D/w daughter and son at bedside. Explained critically ill status and potential for further worsening. They decided to make the pt DNR, which her husband agrees with.     Labs   ECHO 03/27/2023  1. Left ventricular ejection fraction, by estimation, is 20%. The left  ventricle has severely decreased function. The left ventricle demonstrates  global hypokinesis. There is moderate left ventricular hypertrophy. Left  ventricular diastolic parameters  are indeterminate. Elevated left atrial pressure. The E/e' is 18.   2. Right ventricular systolic function is moderately reduced. The right  ventricular size  is normal. There is normal pulmonary artery systolic  pressure. The estimated right ventricular systolic pressure is 31.0 mmHg.   3. Left atrial size was moderately dilated.   4. Right atrial size was mildly dilated.   5. The mitral valve is degenerative. Mild to moderate mitral valve   regurgitation. No evidence of mitral stenosis. Severe mitral annular  calcification.   6. The aortic valve is abnormal. There is severe calcifcation of the  aortic valve. Aortic valve regurgitation is mild. Possible severe low flow  low gradient aortic valve stenosis. Aortic valve area, by VTI measures  0.58 cm. Aortic valve mean gradient  measures 21.0 mmHg. Aortic valve Vmax measures 3.00 m/s. DVI 0.20.   7. Patient on Bipap. The inferior vena cava is dilated in size with <50%  respiratory variability, suggesting right atrial pressure of 15 mmHg.   CBC: Recent Labs  Lab 03/26/24 1240 03/27/24 0500 03/27/24 2016 03/27/24 2133 03/28/24 0343  WBC 12.2* 19.7* 14.1*  --  15.2*  NEUTROABS 10.8* 16.4* 11.3*  --   --   HGB 8.7* 8.0* 7.2* 14.6 7.0*  HCT 29.8* 25.7* 23.2* 43.0 22.1*  MCV 81.9 77.2* 76.1*  --  75.2*  PLT 276 234 169  --  164    Basic Metabolic Panel: Recent Labs  Lab 03/27/24 0500 03/27/24 2016 03/27/24 2133 03/28/24 0343 03/28/24 0345 03/28/24 0613  NA 138 133* 137 129* 130* 138  K 3.3* 3.6 3.7 3.4* 3.4* 3.6  CL 95* 90*  --  81* 81* 92*  CO2 28 20*  --  16* 16* 17*  GLUCOSE 126* 395*  --  720* 732* 331*  BUN 12 24*  --  27* 27* 32*  CREATININE 1.60* 2.86*  --  2.87* 2.89* 3.35*  CALCIUM  9.2 9.5  --  8.7* 8.8* 10.0  MG 2.0 1.8  --  1.8  --   --   PHOS 2.0* 3.7  --  3.8 3.8  --    GFR: Estimated Creatinine Clearance: 15.5 mL/min (A) (by C-G formula based on SCr of 3.35 mg/dL (H)). Recent Labs  Lab 03/26/24 1021 03/26/24 1240 03/26/24 1726 03/27/24 0500 03/27/24 2016 03/27/24 2137 03/28/24 0343  WBC  --  12.2*  --  19.7* 14.1*  --  15.2*  LATICACIDVEN 2.6* 3.5* 1.5  --   --  1.8  --     Liver Function Tests: Recent Labs  Lab 03/26/24 1240 03/27/24 0500 03/27/24 2016 03/28/24 0345  AST 25  --  32  --   ALT 23  --  20  --   ALKPHOS 140*  --  122  --   BILITOT 0.3  --  <0.2  --   PROT 6.3*  --  6.3*  --   ALBUMIN  3.0* 3.7 3.0* 2.7*    Recent Labs  Lab 03/27/24 2016  LIPASE <10*  AMYLASE 100   Recent Labs  Lab 03/28/24 0343  AMMONIA 30    ABG    Component Value Date/Time   PHART 7.530 (H) 03/27/2024 2133   PCO2ART 27.2 (L) 03/27/2024 2133   PO2ART 206 (H) 03/27/2024 2133   HCO3 22.8 03/27/2024 2133   TCO2 24 03/27/2024 2133   O2SAT 100 03/27/2024 2133     Coagulation Profile: Recent Labs  Lab 03/26/24 1240 03/27/24 2016  INR 1.4* 1.4*    Cardiac Enzymes: No results for input(s): CKTOTAL, CKMB, CKMBINDEX, TROPONINI in the last 168 hours.  HbA1C: Hemoglobin-A1c  Date/Time Value Ref Range Status  08/22/2013 12:00 AM 10.3  Final   Hemoglobin A1C  Date/Time Value Ref Range Status  04/20/2023 10:05 AM 8.4 (A) 4.0 - 5.6 % Final  12/15/2022 01:30 PM 7.7 (A) 4.0 - 5.6 % Final  07/17/2019 12:00 AM 7.8  Final   HbA1c, POC (controlled diabetic range)  Date/Time Value Ref Range Status  06/18/2020 11:28 AM 8.4 (A) 0.0 - 7.0 % Final  02/28/2020 03:59 PM 7.2 (A) 0.0 - 7.0 % Final   Hgb A1c MFr Bld  Date/Time Value Ref Range Status  10/27/2023 11:08 AM 6.7 (H) 4.8 - 5.6 % Final    Comment:    (NOTE)         Prediabetes: 5.7 - 6.4         Diabetes: >6.4         Glycemic control for adults with diabetes: <7.0   07/07/2022 11:40 AM 6.3 (H) 4.8 - 5.6 % Final    Comment:             Prediabetes: 5.7 - 6.4          Diabetes: >6.4          Glycemic control for adults with diabetes: <7.0     CBG: Recent Labs  Lab 03/28/24 0344 03/28/24 0524 03/28/24 0738 03/28/24 1227 03/28/24 1530  GLUCAP 188* 199* 204* 177* 193*    Review of Systems:   Unable to obtain  Past Medical History:  She,  has a past medical history of Abnormal MRI, cervical spine (03/04/2020), Acid reflux, Acute osteomyelitis of toe of left foot (HCC) (08/13/2022), Amputated left leg (HCC), Anemia, Arthritis, Axillary masses, Back pain, CHF (congestive heart failure) (HCC), COVID-19 virus infection (04/06/2019), CVA  (cerebral vascular accident) (HCC) (12/17/2022), Depression, 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   Surgical History:   Past Surgical History:  Procedure Laterality Date   ABDOMINAL AORTOGRAM W/LOWER EXTREMITY N/A 04/30/2022   Procedure: ABDOMINAL AORTOGRAM W/LOWER EXTREMITY;  Surgeon: Magda Debby SAILOR, MD;  Location: [facility];  Service: Cardiovascular;  Laterality: N/A;   ABDOMINAL AORTOGRAM W/LOWER EXTREMITY N/A 07/21/2022   Procedure: ABDOMINAL AORTOGRAM W/LOWER EXTREMITY;  Surgeon: Serene Gaile ORN, MD;  Location: [facility];  Service: Cardiovascular;  Laterality: N/A;   ABDOMINAL HYSTERECTOMY     ACHILLES TENDON LENGTHENING  08/15/2022   Procedure: ACHILLES TENDON LENGTHENING;  Surgeon: Silva Juliene SAUNDERS, DPM;  Location: [facility] OR;  Service: Podiatry;;   AMPUTATION Right 05/29/2022   Procedure: RIGHT BELOW THE KNEE AMPUTATION;  Surgeon: Harden Jerona GAILS, MD;  Location: [facility] OR;  Service: Orthopedics;  Laterality: Right;   AMPUTATION Left 09/04/2022   Procedure: AMPUTATION FOOT, serial irrigation;  Surgeon: Joya Stabs, DPM;  Location: [facility] OR;  Service: Podiatry;  Laterality: Left;  Surgical team to do block   AMPUTATION Left 10/07/2022   Procedure: LEFT BELOW KNEE AMPUTATION;  Surgeon: Harden Jerona GAILS, MD;  Location: [facility] OR;  Service: Orthopedics;  Laterality: Left;   AMPUTATION Left 02/03/2024   Procedure: AMPUTATION, HAND;  Surgeon: Arlinda Buster, MD;  Location:  [facility] OR;  Service: Orthopedics;  Laterality: Left;  LEFT HAND AMPUTATION   AMPUTATION FINGER Left 09/29/2023   Procedure: AMPUTATION, FINGER;  Surgeon: Harden Jerona GAILS, MD;  Location: [facility] OR;  Service: Orthopedics;  Laterality: Left;  LEFT HAND LONG FINGER RAY AMPUTATION   AV FISTULA PLACEMENT Left 09/02/2017   Procedure: creation of left arm ARTERIOVENOUS (AV) FISTULA;  Surgeon: Serene Gaile ORN, MD;  Location: [facility] OR;  Service: Vascular;  Laterality: Left;   COLONOSCOPY  2008   Dr. Harvey: normal    COLONOSCOPY N/A 12/18/2016   Dr. Harvey: multiple tubular adenomas, internal hemorrhoids. Surveillance in 3 years    COLONOSCOPY N/A 12/05/2023   Procedure: COLONOSCOPY;  Surgeon: Cindie Carlin POUR, DO;  Location: [facility];  Service: Endoscopy;  Laterality: N/A;   COLONOSCOPY N/A 12/17/2023   Procedure: COLONOSCOPY;  Surgeon: Shaaron Lamar HERO, MD;  Location: [facility];  Service: Endoscopy;  Laterality: N/A;   COLONOSCOPY N/A 01/01/2024   Procedure: COLONOSCOPY;  Surgeon: Cindie Carlin POUR, DO;  [facility]: [facility];  Service: Endoscopy;  Laterality: N/A;   DIALYSIS/PERMA CATHETER INSERTION N/A 03/17/2024   Procedure: DIALYSIS/PERMA CATHETER INSERTION;  Surgeon: Magda Debby SAILOR, MD;  [facility]: [facility];  Service: Cardiovascular;  Laterality: N/A;   ESOPHAGEAL DILATION N/A 10/13/2015   Procedure: ESOPHAGEAL DILATION;  Surgeon: Claudis RAYMOND Rivet, MD;  [facility]: [facility];  Service: Endoscopy;  Laterality: N/A;   ESOPHAGEAL DILATION N/A 12/03/2023   Procedure: DILATION, ESOPHAGUS;  Surgeon: Cindie Carlin POUR, DO;  [facility]: [facility];  Service: Endoscopy;  Laterality: N/A;   ESOPHAGOGASTRODUODENOSCOPY N/A 10/13/2015   Dr. Rivet: chronic gastritis on path, no H.pylori. Empiric dilation    ESOPHAGOGASTRODUODENOSCOPY N/A 12/18/2016   Dr. Harvey: mild gastritis. BRAVO study revealed uncontrolled GERD. Dysphagia secondary to uncontrolled reflux   ESOPHAGOGASTRODUODENOSCOPY N/A 12/03/2023    Procedure: EGD (ESOPHAGOGASTRODUODENOSCOPY);  Surgeon: Cindie Carlin POUR, DO;  [facility]: [facility];  Service: Endoscopy;  Laterality: N/A;   FOOT SURGERY Bilateral    nerve     INCISION AND DRAINAGE OF WOUND Left 10/30/2023   Procedure: IRRIGATION AND DEBRIDEMENT WOUND;  Surgeon: Arlinda Buster, MD;  Location: [facility] OR;  Service: Orthopedics;  Laterality: Left;  LEFT HAND WOUND   INSERTION OF DIALYSIS CATHETER N/A 02/03/2024   Procedure: INSERTION OF DIALYSIS CATHETER;  Surgeon: Magda Debby SAILOR, MD;  Location: [facility] OR;  Service: Vascular;  Laterality: N/A;   LEFT HEART CATH AND CORONARY ANGIOGRAPHY N/A 12/29/2018   Procedure: LEFT HEART CATH AND CORONARY ANGIOGRAPHY;  Surgeon: Dann Candyce RAMAN, MD;  Location: [facility] INVASIVE CV LAB;  Service: Cardiovascular;  Laterality: N/A;   LIGATION OF ARTERIOVENOUS  FISTULA Left 02/03/2024   Procedure: LIGATION OF ARTERIOVENOUS  FISTULA;  Surgeon: Magda Debby SAILOR, MD;  Location: [facility] OR;  Service: Vascular;  Laterality: Left;   LOWER EXTREMITY ANGIOGRAPHY Right 05/04/2022   Procedure: Lower Extremity Angiography;  Surgeon: Lanis Fonda BRAVO, MD;  Location: [facility] INVASIVE CV LAB;  Service: Cardiovascular;  Laterality: Right;   LUNG BIOPSY     MASS EXCISION Right 01/09/2013   Procedure: EXCISION OF NEOPLASM OF RIGHT  AXILLA  AND EXCISION OF NEOPLASM OF LEFT AXILLA;  Surgeon: Oneil DELENA Budge, MD;  Location: [facility];  Service: General;  Laterality: Right;  procedure end @ 08:23   MYRINGOTOMY WITH TUBE PLACEMENT Bilateral 04/28/2017   Procedure: BILATERAL MYRINGOTOMY WITH TUBE PLACEMENT;  Surgeon: Karis Clunes, MD;  Location: [facility] OR;  Service: ENT;  Laterality: Bilateral;   PERIPHERAL VASCULAR BALLOON ANGIOPLASTY Right 05/04/2022  Procedure: PERIPHERAL VASCULAR BALLOON ANGIOPLASTY;  Surgeon: Lanis Fonda BRAVO, MD;  Location: [facility] INVAS[facility] CV LAB;  Service: Cardiovascular;  Laterality: Right;  PT   PERIPHERAL VASCULAR INTERVENTION Right 05/04/2022   Procedure: PERIPHERAL VASCULAR  INTERVENTION;  Surgeon: Lanis Fonda BRAVO, MD;  Location: [facility] INVASIVE CV LAB;  Service: Cardiovascular;  Laterality: Right;  SFA   PERIPHERAL VASCULAR INTERVENTION Left 07/21/2022   Procedure: PERIPHERAL VASCULAR INTERVENTION;  Surgeon: Serene Gaile ORN, MD;  Location: [facility];  Service: Cardiovascular;  Laterality: Left;   REVISION OF ARTERIOVENOUS GORETEX GRAFT Left 05/04/2018   Procedure: TRANSPOSITION OF CEPHALIC VEIN ARTERIOVENOUS FISTULA LEFT ARM;  Surgeon: Oris Krystal FALCON, MD;  Location: [facility];  Service: Vascular;  Laterality: Left;   SAVORY DILATION N/A 12/18/2016   Procedure: SAVORY DILATION;  Surgeon: Harvey Margo CROME, MD;  [facility];  Service: Endoscopy;  Laterality: N/A;   TRANSMETATARSAL AMPUTATION Left 08/15/2022   Procedure: TRANSMETATARSAL AMPUTATION;  Surgeon: Silva Juliene SAUNDERS, DPM;  Location: [facility] OR;  Service: Podiatry;  Laterality: Left;     Social History:   reports that she has never smoked. She has never been exposed to tobacco smoke. She has never used smokeless tobacco. She reports that she does not drink alcohol  and does not use drugs.   Family History:  Her family history includes Arthritis in her father; Breast cancer in her sister; Hypercholesterolemia in her father and sister; Hypertension in her father, sister, and sister. There is no history of Colon cancer or Colon polyps.   Allergies Allergies[1]   Home Medications  Prior to Admission medications  Medication Sig Start Date End Date Taking? Authorizing Provider  acetaminophen  (TYLENOL ) 500 MG tablet Take 1,000 mg by mouth every 6 (six) hours as needed for mild pain (pain score 1-3).    [provider]  amLODipine  (NORVASC ) 10 MG tablet Take 0.5 tablets (5 mg total) by mouth daily. 10/28/23   Pearlean Manus, MD  ASPIRIN  LOW DOSE 81 MG chewable tablet Chew 162 mg by mouth daily. 01/05/24   [provider]  B Complex-C-Folic Acid  (RENA-VITE RX) 1 MG TABS Take 1 tablet by mouth  daily. 04/15/22   [provider]  busPIRone  (BUSPAR ) 7.5 MG tablet Take 1 tablet (7.5 mg total) by mouth 3 (three) times daily. 02/22/24   Antonetta Rollene BRAVO, MD  calcitRIOL  (ROCALTROL ) 0.5 MCG capsule Take 2 capsules (1 mcg total) by mouth every Monday, Wednesday, and Friday with hemodialysis. 11/05/23   Caleen Burgess BROCKS, MD  cinacalcet  (SENSIPAR ) 30 MG tablet Take 3 tablets (90 mg total) by mouth every Monday, Wednesday, and Friday. 12/08/23   Johnson, Clanford L, MD  ezetimibe  (ZETIA ) 10 MG tablet Take 1 tablet (10 mg total) by mouth daily. 04/27/23   Antonetta Rollene BRAVO, MD  insulin  aspart (NOVOLOG ) 100 UNIT/ML injection Inject 0-6 Units into the skin 3 (three) times daily with meals. CBG 70 - 120: 0 units  CBG 121 - 150: 0 units  CBG 151 - 200: 1 unit  CBG 201-250: 2 units  CBG 251-300: 3 units  CBG 301-350: 4 units  CBG 351-400: 5 units  CBG > 400: Give 10 units and call MD 11/12/23   Shahmehdi, Seyed A, MD  insulin  glargine, 2 Unit Dial , (TOUJEO  MAX SOLOSTAR) 300 UNIT/ML Solostar Pen Inject 30 Units into the skin daily. May need to slowly increase the dose depending upon your blood sugar, follow-up with PCP 01/01/23   Trixie File, MD  metoprolol  succinate (TOPROL -XL)  50 MG 24 hr tablet TAKE ONE TABLET BY MOUTH ONCE DAILY WITH FOOD (TAKE AFTER DIALYSIS) 12/20/23   Antonetta Rollene BRAVO, MD  mirtazapine  (REMERON ) 7.5 MG tablet Take 1 tablet (7.5 mg total) by mouth at bedtime. 03/06/24   Antonetta Rollene BRAVO, MD  ondansetron  (ZOFRAN ) 4 MG tablet Take 4 mg by mouth every 8 (eight) hours as needed for nausea or vomiting. 11/26/23   [provider]  pantoprazole  (PROTONIX ) 40 MG tablet TAKE ONE TABLET BY MOUTH EVERY DAY 03/01/24   Antonetta Rollene BRAVO, MD  pregabalin  (LYRICA ) 50 MG capsule Take 1 capsule (50 mg total) by mouth 3 (three) times daily. 03/03/24   Antonetta Rollene BRAVO, MD  senna (SENOKOT) 8.6 MG TABS tablet Take 1 tablet (8.6 mg total) by mouth daily as needed for mild  constipation or moderate constipation. 02/06/24   Arlice Reichert, MD  sertraline  (ZOLOFT ) 100 MG tablet Take 1 tablet (100 mg total) by mouth daily. 02/22/24   Antonetta Rollene BRAVO, MD  [name]  500 MG chewable tablet Chew 1-2 tablets (500-1,000 mg total) by mouth See admin instructions. Take 1000mg  (2 tablets) by mouth with meals and 500mg  (1 tablet) with snacks 08/23/23   Antonetta Rollene BRAVO, MD  FLUoxetine (PROZAC) 10 MG capsule Take 10 mg by mouth daily.    05/28/11  [provider]  glipiZIDE  (GLUCOTROL ) 10 MG tablet Take 10 mg by mouth 2 (two) times daily before a meal.    05/28/11  [provider]     Critical care time: 45 mins     Taliah Porche Pleas, MD  [facility] Prefer epic messenger for cross cover needs   Critical care time was exclusive of separately billable procedures and treating other patients.  Critical care was necessary to treat or prevent imminent or life-threatening deterioration.  Critical care was time spent personally by me on the following activities: development of treatment plan with patient and/or surrogate as well as nursing, discussions with consultants, evaluation of patient's response to treatment, examination of patient, obtaining history from patient or surrogate, ordering and performing treatments and interventions, ordering and review of laboratory studies, ordering and review of radiographic studies, pulse oximetry, re-evaluation of patient's condition and participation in multidisciplinary rounds.    [1]  Allergies Allergen Reactions   Ace Inhibitors Anaphylaxis and Swelling   Penicillins Itching and Swelling    Has tolerated cefazolin  on multiple occasions    Statins Other (See Comments)    Elevated LFT's   Albuterol  Swelling   "

## 2024-03-28 NOTE — Progress Notes (Signed)
 Discussed case with Dr. Loni, cardiology. Patient's new echo shows newly reduced EF of 20%, reduced RV function with severe aortic stenosis. Patient is not a candidate for any valvular surgery She has a history of A-fib with RVR and currently has uncontrolled heart rate despite amiodarone .  Not a candidate for cardioversion as she was not on anticoagulation, prior history of GI bleed.  Patient remains encephalopathic on BiPAP on vasopressors   Discussed again with the patient's son and daughter.  Updated her critical illness and new echo findings. Given her multiple medical comorbidities-including biventricular heart failure, severe aortic stenosis, A-fib with RVR, history of GI bleed, ESRD on HD, MSSA bacteremia, peripheral artery disease with need for multiple amputations, I recommended comfort care/hospice.  Patient's family understood her critical illness and were aware that it was unlikely for her to recover from this. They decided to pursue comfort care pathways. They will call rest of the family and when family is ready, will place comfort care orders.

## 2024-03-28 NOTE — Plan of Care (Signed)
" °  Problem: Clinical Measurements: Goal: Diagnostic test results will improve Outcome: Progressing Goal: Respiratory complications will improve Outcome: Progressing   Problem: Elimination: Goal: Will not experience complications related to bowel motility Outcome: Progressing   Problem: Safety: Goal: Ability to remain free from injury will improve Outcome: Progressing   Problem: Education: Goal: Knowledge of General Education information will improve Description: Including pain rating scale, medication(s)/side effects and non-pharmacologic comfort measures Outcome: Not Progressing   "

## 2024-03-28 NOTE — Progress Notes (Signed)
 "        [facility]  Date of Admission:  03/26/2024       Lines: Peripheral iv's  Abx: 1/05; 1/6-c cefazolin   1/05-06 meropenem  1/05-06 vanc 1/4 ceftriaxone   ASSESSMENT: 69 yo female bilateral bka, dm2esrd admitted for septic shock severe sepsis found to have mssa bsi in setting clabsi   Right internal jugular hd catheter removed 1/05 1/4 bcx mssa 1/4 mrsa nares pcr negative 1/5 bcx ngtd  Tte ef 25% new from 05/2023 (ef then 55%). 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 Xray reviewed appear volume rather than aspiration pna. Tolerating bipap as of 03/29/23    Shock on pressors improving   Cards following for chf/afib   PLAN: Continue cefazolin  Will review for metastatic foci of involvement once more awake Maintain standard isolation precaution F/u repeat bcx from 1/5; if negative on 1/7 afternoon can replace hd catheter Would need to get tee once more stable    Principal Problem:   Sepsis due to urinary tract infection (HCC) Active Problems:   Hyperlipidemia   Obesity   OSA (obstructive sleep apnea)   HTN (hypertension)   ESRD on hemodialysis (HCC)   PAD (peripheral artery disease)   GAD (generalized anxiety disorder)   Type 2 diabetes mellitus with other specified complication (HCC)   PAF (paroxysmal atrial fibrillation) (HCC)   History of GI bleeding   S/P bilateral BKA (below knee amputation) (HCC)   Neuropathy due to type 2 diabetes mellitus (HCC)   Persistent atrial fibrillation (HCC)   Septic shock (HCC)   Allergies[1]  Scheduled Meds:  [START ON 04/17/2024] calcitRIOL   1 mcg Oral Q M,W,F-HD   Chlorhexidine  Gluconate Cloth  6 each Topical Q0600   [START ON 04/14/2024] cinacalcet   90 mg Oral Q M,W,F   feeding supplement (PROSource TF20)  60 mL Per Tube  Daily   heparin   5,000 Units Subcutaneous Q8H   insulin  aspart  0-9 Units Subcutaneous Q4H   insulin  glargine-yfgn  10 Units Subcutaneous Daily   multivitamin  1 tablet Per Tube QHS   sucroferric oxyhydroxide  1,000 mg Oral TID WC   sucroferric oxyhydroxide  500 mg Oral With snacks   thiamine   100 mg Per Tube Daily   Continuous Infusions:  amiodarone  30 mg/hr (03/28/24 1600)   anticoagulant sodium citrate       ceFAZolin  (ANCEF ) IV     feeding supplement (OSMOLITE 1.5 CAL)     norepinephrine  (LEVOPHED ) Adult infusion 4 mcg/min (03/28/24 1600)   PRN Meds:.acetaminophen , alteplase , anticoagulant sodium citrate , heparin , lidocaine  (PF), lidocaine -prilocaine , melatonin, metoprolol  tartrate, pentafluoroprop-tetrafluoroeth, polyethylene glycol, prochlorperazine , senna   SUBJECTIVE: Transferred to mc, in icu Pressor requirement improving Remains on bipap Sleepy/ams still  Multiple family members at bedside Fever resolved  Brief bsAbx escalation narrowed back to cefazolin  by primary tea  Review of Systems: ROS All other ROS was negative, except mentioned above     OBJECTIVE: Vitals:   03/28/24 1530 03/28/24 1545 03/28/24 1550 03/28/24 1600  BP: 95/68 (!) 70/56  92/70  Pulse: (!) 109 (!) 130 (!) 121 75  Resp: 18 (!) 30 17 20   Temp:      TempSrc:      SpO2: 97% 99% 100% 100%  Weight:      Height:       Body mass index is 26.07 kg/m.  Physical Exam General/constitutional: acutely ill appearing; on bipap HEENT: Normocephalic Neck supple CV: rrr no mrg Lungs: clear to auscultation, normal respiratory effort on bipap Abd: Soft, Nontender Ext: no edema Skin: No Rash Neuro: sleepy/comatose MSK: bilateral bka stump no ulcer    Lab Results Lab Results  Component Value Date   WBC 15.2 (H) 03/28/2024   HGB 7.0 (L) 03/28/2024   HCT 22.1 (L) 03/28/2024   MCV 75.2 (L) 03/28/2024   PLT 164 03/28/2024    Lab Results  Component Value Date   CREATININE 3.35 (H)  03/28/2024   BUN 32 (H) 03/28/2024   NA 138 03/28/2024   K 3.6 03/28/2024   CL 92 (L) 03/28/2024   CO2 17 (L) 03/28/2024    Lab Results  Component Value Date   ALT 20 03/27/2024   AST 32 03/27/2024   ALKPHOS 122 03/27/2024   BILITOT <0.2 03/27/2024      Microbiology: Recent Results (from the past 240 hours)  Resp panel by RT-PCR (RSV, Flu A&B, Covid) Anterior Nasal Swab     Status: None   Collection Time: 03/24/24 12:52 PM   Specimen: Anterior Nasal Swab  Result Value Ref Range Status   SARS Coronavirus 2 by RT PCR NEGATIVE NEGATIVE Final    Comment: (NOTE) SARS-CoV-2 target nucleic acids are NOT DETECTED.  The SARS-CoV-2 RNA is generally detectable in upper respiratory specimens during the acute phase of infection. The lowest concentration of SARS-CoV-2 viral copies this assay can detect is 138 copies/mL. A negative result does not preclude SARS-Cov-2 infection and should not be used as the sole basis for treatment or other patient management decisions. A negative result may occur with  improper specimen collection/handling, submission of specimen other than nasopharyngeal swab, presence of viral mutation(s) within the areas targeted by this assay, and inadequate number of viral copies(<138 copies/mL). A negative result must be combined with clinical observations, patient history, and epidemiological information. The expected result is Negative.  Fact Sheet for Patients:  bloggercourse.com  Fact Sheet for Healthcare Providers:  seriousbroker.it  This test is no t yet approved or cleared by the [place]  FDA and  has been authorized for detection and/or diagnosis of SARS-CoV-2 by FDA under an Emergency Use Authorization (EUA). This EUA will remain  in effect (meaning this test can be used) for the duration of the COVID-19 declaration under Section 564(b)(1) of the Act, 21 U.S.C.section 360bbb-3(b)(1), unless the  authorization is terminated  or revoked sooner.       Influenza A by PCR NEGATIVE NEGATIVE Final   Influenza B by PCR NEGATIVE NEGATIVE Final    Comment: (NOTE) The Xpert Xpress SARS-CoV-2/FLU/RSV plus assay is intended as an aid in the diagnosis of influenza from Nasopharyngeal swab specimens and should not be used as a sole basis for treatment. Nasal washings and aspirates are unacceptable for Xpert Xpress SARS-CoV-2/FLU/RSV testing.  Fact Sheet for Patients: bloggercourse.com  Fact Sheet for Healthcare Providers: seriousbroker.it  This test is not yet approved or cleared by the [place]  FDA and has been authorized for detection and/or diagnosis of SARS-CoV-2 by FDA under an Emergency Use Authorization (EUA). This EUA will remain in effect (meaning this test can be used) for the duration of the COVID-19 declaration under Section 564(b)(1) of the Act, 21 U.S.C. section 360bbb-3(b)(1), unless the authorization is terminated or revoked.     Resp Syncytial Virus by PCR NEGATIVE NEGATIVE Final    Comment: (NOTE) Fact Sheet for Patients: bloggercourse.com  Fact Sheet  for Healthcare Providers: seriousbroker.it  This test is not yet approved or cleared by the [place]  FDA and has been authorized for detection and/or diagnosis of SARS-CoV-2 by FDA under an Emergency Use Authorization (EUA). This EUA will remain in effect (meaning this test can be used) for the duration of the COVID-19 declaration under Section 564(b)(1) of the Act, 21 U.S.C. section 360bbb-3(b)(1), unless the authorization is terminated or revoked.  Performed at [facility], [address], [place], [place] [address]   Blood Culture (routine x 2)     Status: Abnormal (Preliminary result)   Collection Time: 03/26/24 10:21 AM   Specimen: BLOOD RIGHT FOREARM  Result Value Ref Range Status   Specimen  Description   Final    BLOOD RIGHT FOREARM BLOOD Performed at [facility], [address], [place], [place] [address]    Special Requests   Final    BOTTLES DRAWN AEROBIC AND ANAEROBIC Blood Culture adequate volume Performed at [facility], [address], [place], [place] [address]    Culture  Setup Time   Final    GRAM POSITIVE COCCI IN BOTH AEROBIC AND ANAEROBIC BOTTLES CRITICAL RESULT CALLED TO, READ BACK BY AND VERIFIED WITH: KINDLY,C ON 03/27/24 AT 0208 BY PURDIE,J REVA GPC BY DD 03/27/2024 @ 0545 CRITICAL VALUE NOTED.  VALUE IS CONSISTENT WITH PREVIOUSLY REPORTED AND CALLED VALUE. Performed at [facility], [address], [place], [place] [address]    Culture STAPHYLOCOCCUS AUREUS (A)  Final   Report Status PENDING  Incomplete  Blood Culture (routine x 2)     Status: Abnormal (Preliminary result)   Collection Time: 03/26/24 12:40 PM   Specimen: BLOOD  Result Value Ref Range Status   Specimen Description   Final    BLOOD LEFT ANTECUBITAL Performed at [facility], [address], [place], [place] [address]    Special Requests   Final    AEROBIC BOTTLE ONLY Blood Culture adequate volume Performed at [facility], [address], [place], [place] [address]    Culture  Setup Time   Final    GRAM POSITIVE COCCI AEROBIC BOTTLE ONLY CRITICAL RESULT CALLED TO, READ BACK BY AND VERIFIED WITH: KINDLY,C ON 03/27/24 AT 0208 BY PURDIE,J  REVA GPC BY DD 03/27/2024 @ 0538 Organism ID to follow  CODY [facility] CHARGE RN 03/27/2024 BY DD @ 0650    Culture (A)  Final    STAPHYLOCOCCUS AUREUS SUSCEPTIBILITIES TO FOLLOW Performed at [facility], [address], [place], [place] [address]    Report Status PENDING  Incomplete  Blood Culture ID Panel (Reflexed)     Status: Abnormal   Collection Time: 03/26/24 12:40 PM  Result Value Ref Range Status   Enterococcus faecalis NOT DETECTED NOT DETECTED Final   Enterococcus Faecium NOT DETECTED NOT DETECTED Final   Listeria monocytogenes  NOT DETECTED NOT DETECTED Final   Staphylococcus species DETECTED (A) NOT DETECTED Final    Comment: CRITICAL RESULT CALLED TO, READ BACK BY AND VERIFIED WITH:  CODY KINDLEY CHARGE RN 03/27/2024 BY DD @ 0650    Staphylococcus aureus (BCID) DETECTED (A) NOT DETECTED Final    Comment: CRITICAL RESULT CALLED TO, READ BACK BY AND VERIFIED WITH:  CODY KINDLEY CHARGE RN 03/27/2024 BY DD @ 0650    Staphylococcus epidermidis NOT DETECTED NOT DETECTED Final   Staphylococcus lugdunensis NOT DETECTED NOT DETECTED Final   Streptococcus species NOT DETECTED NOT DETECTED Final   Streptococcus agalactiae NOT DETECTED NOT DETECTED Final  Streptococcus pneumoniae NOT DETECTED NOT DETECTED Final   Streptococcus pyogenes NOT DETECTED NOT DETECTED Final   A.calcoaceticus-baumannii NOT DETECTED NOT DETECTED Final   Bacteroides fragilis NOT DETECTED NOT DETECTED Final   Enterobacterales NOT DETECTED NOT DETECTED Final   Enterobacter cloacae complex NOT DETECTED NOT DETECTED Final   Escherichia coli NOT DETECTED NOT DETECTED Final   Klebsiella aerogenes NOT DETECTED NOT DETECTED Final   Klebsiella oxytoca NOT DETECTED NOT DETECTED Final   Klebsiella pneumoniae NOT DETECTED NOT DETECTED Final   Proteus species NOT DETECTED NOT DETECTED Final   Salmonella species NOT DETECTED NOT DETECTED Final   Serratia marcescens NOT DETECTED NOT DETECTED Final   Haemophilus influenzae NOT DETECTED NOT DETECTED Final   Neisseria meningitidis NOT DETECTED NOT DETECTED Final   Pseudomonas aeruginosa NOT DETECTED NOT DETECTED Final   Stenotrophomonas maltophilia NOT DETECTED NOT DETECTED Final   Candida albicans NOT DETECTED NOT DETECTED Final   Candida auris NOT DETECTED NOT DETECTED Final   Candida glabrata NOT DETECTED NOT DETECTED Final   Candida krusei NOT DETECTED NOT DETECTED Final   Candida parapsilosis NOT DETECTED NOT DETECTED Final   Candida tropicalis NOT DETECTED NOT DETECTED Final   Cryptococcus  neoformans/gattii NOT DETECTED NOT DETECTED Final   Meth resistant mecA/C and MREJ NOT DETECTED NOT DETECTED Final    Comment: Performed at [facility], [address], [place], [place] [address]  MRSA Next Gen by PCR, Nasal     Status: None   Collection Time: 03/26/24  5:28 PM   Specimen: Nasal Mucosa; Nasal Swab  Result Value Ref Range Status   MRSA by PCR Next Gen NOT DETECTED NOT DETECTED Final    Comment: (NOTE) The GeneXpert MRSA Assay (FDA approved for NASAL specimens only), is one component of a comprehensive MRSA colonization surveillance program. It is not intended to diagnose MRSA infection nor to guide or monitor treatment for MRSA infections. Test performance is not FDA approved in patients less than 27 years old. Performed at [facility], [address] [place], [place] [address]   Respiratory (~20 pathogens) panel by PCR     Status: None   Collection Time: 03/26/24  5:28 PM   Specimen: Nasopharyngeal Swab; Respiratory  Result Value Ref Range Status   Adenovirus NOT DETECTED NOT DETECTED Final   Coronavirus 229E NOT DETECTED NOT DETECTED Final    Comment: (NOTE) The Coronavirus on the Respiratory Panel, DOES NOT test for the novel  Coronavirus (2019 nCoV)    Coronavirus HKU1 NOT DETECTED NOT DETECTED Final   Coronavirus NL63 NOT DETECTED NOT DETECTED Final   Coronavirus OC43 NOT DETECTED NOT DETECTED Final   Metapneumovirus NOT DETECTED NOT DETECTED Final   Rhinovirus / Enterovirus NOT DETECTED NOT DETECTED Final   Influenza A NOT DETECTED NOT DETECTED Final   Influenza B NOT DETECTED NOT DETECTED Final   Parainfluenza Virus 1 NOT DETECTED NOT DETECTED Final   Parainfluenza Virus 2 NOT DETECTED NOT DETECTED Final   Parainfluenza Virus 3 NOT DETECTED NOT DETECTED Final   Parainfluenza Virus 4 NOT DETECTED NOT DETECTED Final   Respiratory Syncytial Virus NOT DETECTED NOT DETECTED Final   Bordetella pertussis NOT DETECTED NOT DETECTED Final   Bordetella  Parapertussis NOT DETECTED NOT DETECTED Final   Chlamydophila pneumoniae NOT DETECTED NOT DETECTED Final   Mycoplasma pneumoniae NOT DETECTED NOT DETECTED Final    Comment: Performed at [facility], [address] [place], [place] [address]  Culture, blood (Routine X  2) w Reflex to ID Panel     Status: None (Preliminary result)   Collection Time: 03/27/24  8:16 PM   Specimen: BLOOD RIGHT ARM  Result Value Ref Range Status   Specimen Description BLOOD RIGHT ARM  Final   Special Requests   Final    AEROBIC BOTTLE ONLY Blood Culture results may not be optimal due to an inadequate volume of blood received in culture bottles   Culture   Final    NO GROWTH < 12 HOURS Performed at [facility], [address], [place], [place] [address]    Report Status PENDING  Incomplete  Culture, blood (Routine X 2) w Reflex to ID Panel     Status: None (Preliminary result)   Collection Time: 03/27/24  8:16 PM   Specimen: BLOOD RIGHT ARM  Result Value Ref Range Status   Specimen Description BLOOD RIGHT ARM  Final   Special Requests   Final    AEROBIC BOTTLE ONLY Blood Culture results may not be optimal due to an inadequate volume of blood received in culture bottles   Culture   Final    NO GROWTH < 12 HOURS Performed at [facility], [address], [place], [place] [address]    Report Status PENDING  Incomplete     Serology:   Imaging: If present, new imagings (plain films, ct scans, and mri) have been personally visualized and interpreted; radiology reports have been reviewed. Decision making incorporated into the Impression / Recommendations.  1/5 cxr 1. Linear scarring or atelectasis in the lingula/left base. 2. Cardiomegaly.  1/4 ct chest abd pelv LUNGS AND PLEURA: Lungs are hypoinflated. Scarring, architectural distortion and volume loss are again noted within the lung bases, similar to the previous exam. Several small lung nodules are identified appear unchanged from  the previous exam. The largest is in the posterior right lower lobe measuring 5 mm, image 82/5. Stable since 11/18/2022. These are compatible with benign nodules. No focal consolidation or pulmonary edema. No pleural effusion. No pneumothorax.  1. Diffuse circumferential bladder wall thickening with surrounding stranding and a 3 mm punctate calcification at the left UVJ without significant left hydroureteronephrosis, most consistent with acute cystitis/ureteritis with a distal ureteral stone follow-up urinalysis/culture. 2. Stable 6 mm hyperdense filling defect with internal calcifications in the right renal pelvis, indeterminate (differential includes nonobstructing calculus, sloughed papilla, blood clot, or upper tract urothelial neoplasm). 3. Slight interval increase in size of the enhancing right lower pole renal lesion to 1.9 x 1.6 cm, suspicious for renal cell carcinoma (differential includes oncocytoma and lipid-poor angiomyolipoma); recommend urology consultation and renal protocol MRI (preferred) or CT without and with contrast for staging/characterization and management planning. 4. Aortic atherosclerosis and coronary artery calcifications.   1/5 tte  1. Left ventricular ejection fraction, by estimation, is 20%. The left  ventricle has severely decreased function. The left ventricle demonstrates  global hypokinesis. There is moderate left ventricular hypertrophy. Left  ventricular diastolic parameters  are indeterminate. Elevated left atrial pressure. The E/e' is 18.   2. Right ventricular systolic function is moderately reduced. The right  ventricular size is normal. There is normal pulmonary artery systolic  pressure. The estimated right ventricular systolic pressure is 31.0 mmHg.   3. Left atrial size was moderately dilated.   4. Right atrial size was mildly dilated.   5. The mitral valve is degenerative. Mild to moderate mitral valve  regurgitation. No evidence of mitral  stenosis. Severe mitral annular  calcification.   6. The aortic valve is abnormal. There is severe calcifcation of the  aortic valve. Aortic valve regurgitation is mild. Possible severe low flow  low gradient aortic valve stenosis. Aortic valve area, by VTI measures  0.58 cm. Aortic valve mean gradient  measures 21.0 mmHg. Aortic valve Vmax measures 3.00 m/s. DVI 0.20.   7. Patient on Bipap. The inferior vena cava is dilated in size with <50%  respiratory variability, suggesting right atrial pressure of 15 mmHg.   Constance ONEIDA Passer, MD [facility] [phone] pager    03/28/2024, 4:45 PM     [1]  Allergies Allergen Reactions   Ace Inhibitors Anaphylaxis and Swelling   Penicillins Itching and Swelling    Has tolerated cefazolin  on multiple occasions    Statins Other (See Comments)    Elevated LFT's   Albuterol  Swelling   "

## 2024-03-29 LAB — CULTURE, BLOOD (ROUTINE X 2)
Culture  Setup Time: NO GROWTH
Special Requests: ADEQUATE
Special Requests: ADEQUATE

## 2024-03-29 LAB — GLUCOSE, CAPILLARY: Glucose-Capillary: 226 mg/dL — ABNORMAL HIGH (ref 70–99)

## 2024-03-29 MED ORDER — SODIUM CHLORIDE 0.9 % IV SOLN: INTRAVENOUS | Status: DC

## 2024-03-29 MED ORDER — MIDAZOLAM HCL (PF) 2 MG/2ML IJ SOLN: 2.0000 mg | INTRAMUSCULAR | Status: DC | PRN

## 2024-03-29 MED ORDER — ACETAMINOPHEN 650 MG RE SUPP: 650.0000 mg | Freq: Four times a day (QID) | RECTAL | Status: DC | PRN

## 2024-03-29 MED ORDER — GLYCOPYRROLATE 0.2 MG/ML IJ SOLN
0.2000 mg | INTRAMUSCULAR | Status: DC | PRN
  Administered 2024-03-29: 0.2 mg via INTRAVENOUS

## 2024-03-29 MED ORDER — MORPHINE BOLUS VIA INFUSION
5.0000 mg | INTRAVENOUS | Status: DC | PRN
  Administered 2024-03-29 (×2): 5 mg via INTRAVENOUS

## 2024-03-29 MED ORDER — ACETAMINOPHEN 325 MG PO TABS: 650.0000 mg | ORAL_TABLET | Freq: Four times a day (QID) | ORAL | Status: DC | PRN

## 2024-03-29 MED ORDER — MORPHINE 100MG IN NS 100ML (1MG/ML) PREMIX INFUSION
0.0000 mg/h | INTRAVENOUS | Status: DC
  Administered 2024-03-29: 1 mg/h via INTRAVENOUS
  Filled 2024-03-29: qty 100

## 2024-03-29 MED ORDER — GLYCOPYRROLATE 1 MG PO TABS: 1.0000 mg | ORAL_TABLET | ORAL | Status: DC | PRN

## 2024-03-29 MED ORDER — GLYCOPYRROLATE 0.2 MG/ML IJ SOLN: 0.2000 mg | INTRAMUSCULAR | Status: DC | PRN

## 2024-03-30 ENCOUNTER — Telehealth: Payer: Self-pay | Admitting: Family Medicine

## 2024-03-30 NOTE — Telephone Encounter (Signed)
 Copied from CRM [id number]. Topic: General - Other >> Mar 30, 2024 12:00 PM Delon DASEN wrote: Reason for CRM: Need to know the name of the company that sent the hospital bed, it needs to be picked up- please call daugther [phone]

## 2024-03-30 NOTE — Telephone Encounter (Signed)
 Done

## 2024-04-01 LAB — CULTURE, BLOOD (ROUTINE X 2)
Culture: NO GROWTH
Culture: NO GROWTH

## 2024-04-11 ENCOUNTER — Encounter: Admitting: Orthopedic Surgery

## 2024-04-18 ENCOUNTER — Encounter

## 2024-04-23 NOTE — Progress Notes (Signed)
 Nutrition Brief Note  Chart reviewed. Pt now transitioning to comfort care.  No further nutrition interventions planned at this time.  Please re-consult as needed.   Vernell Lukes, RD, LDN, CNSC Registered Dietitian II Please reach out via secure chat

## 2024-04-23 NOTE — Progress Notes (Addendum)
 0800 patient non verbal unable to make any needs known on BIPAP. Patient does not follow commands or open eyes.  1000 family present request morphine  before pulling BIPAP for patient to go comfort care  1158 morphine  started with bolus family all at bedside  1214 BIPAP removed patient on room air  1220 increased morphine  and gave bolus per daughter at bedside patient resp over 30

## 2024-04-23 NOTE — Progress Notes (Signed)
 Noted that pt is going to comfort care. Contacted [facility] and informed. No further support needed at this time.   Bodie Abernethy Dialysis Navigator [phone]

## 2024-04-23 NOTE — Plan of Care (Signed)
" °  Problem: Education: Goal: Knowledge of General Education information will improve Description: Including pain rating scale, medication(s)/side effects and non-pharmacologic comfort measures Outcome: Not Progressing   Problem: Health Behavior/Discharge Planning: Goal: Ability to manage health-related needs will improve Outcome: Not Progressing   Problem: Clinical Measurements: Goal: Ability to maintain clinical measurements within normal limits will improve Outcome: Not Progressing Goal: Will remain free from infection Outcome: Not Progressing Goal: Diagnostic test results will improve Outcome: Not Progressing Goal: Respiratory complications will improve Outcome: Not Progressing Goal: Cardiovascular complication will be avoided Outcome: Not Progressing   Problem: Activity: Goal: Risk for activity intolerance will decrease Outcome: Not Progressing   Problem: Nutrition: Goal: Adequate nutrition will be maintained Outcome: Not Progressing   Problem: Coping: Goal: Level of anxiety will decrease Outcome: Not Progressing   Problem: Elimination: Goal: Will not experience complications related to bowel motility Outcome: Not Progressing Goal: Will not experience complications related to urinary retention Outcome: Not Progressing   Problem: Pain Managment: Goal: General experience of comfort will improve and/or be controlled Outcome: Not Progressing   Problem: Safety: Goal: Ability to remain free from injury will improve Outcome: Not Progressing   Problem: Skin Integrity: Goal: Risk for impaired skin integrity will decrease Outcome: Not Progressing   Problem: Education: Goal: Ability to describe self-care measures that may prevent or decrease complications (Diabetes Survival Skills Education) will improve Outcome: Not Progressing Goal: Individualized Educational Video(s) Outcome: Not Progressing   Problem: Coping: Goal: Ability to adjust to condition or change in  health will improve Outcome: Not Progressing   Problem: Fluid Volume: Goal: Ability to maintain a balanced intake and output will improve Outcome: Not Progressing   Problem: Health Behavior/Discharge Planning: Goal: Ability to identify and utilize available resources and services will improve Outcome: Not Progressing Goal: Ability to manage health-related needs will improve Outcome: Not Progressing   Problem: Metabolic: Goal: Ability to maintain appropriate glucose levels will improve Outcome: Not Progressing   Problem: Nutritional: Goal: Maintenance of adequate nutrition will improve Outcome: Not Progressing Goal: Progress toward achieving an optimal weight will improve Outcome: Not Progressing   Problem: Skin Integrity: Goal: Risk for impaired skin integrity will decrease Outcome: Not Progressing   Problem: Tissue Perfusion: Goal: Adequacy of tissue perfusion will improve Outcome: Not Progressing   Problem: Education: Goal: Knowledge of the prescribed therapeutic regimen will improve Outcome: Not Progressing   Problem: Coping: Goal: Ability to identify and develop effective coping behavior will improve Outcome: Not Progressing   Problem: Clinical Measurements: Goal: Quality of life will improve Outcome: Not Progressing   Problem: Respiratory: Goal: Verbalizations of increased ease of respirations will increase Outcome: Not Progressing   Problem: Role Relationship: Goal: Family's ability to cope with current situation will improve Outcome: Not Progressing Goal: Ability to verbalize concerns, feelings, and thoughts to partner or family member will improve Outcome: Not Progressing   Problem: Pain Management: Goal: Satisfaction with pain management regimen will improve Outcome: Not Progressing  Patient is comfort care "

## 2024-04-23 NOTE — Plan of Care (Signed)
" °  Problem: Pain Managment: Goal: General experience of comfort will improve and/or be controlled Outcome: Progressing    Problem: Role Relationship: Goal: Family's ability to cope with current situation will improve Outcome: Progressing   Problem: Role Relationship: Goal: Ability to verbalize concerns, feelings, and thoughts to partner or family member will improve Outcome: Progressing   Problem: Coping: Goal: Ability to identify and develop effective coping behavior will improve Outcome: Progressing   Plan of care, monitoring, assessment, ongoing, see MAR see flowsheet "

## 2024-04-23 NOTE — Discharge Summary (Signed)
 "  DEATH SUMMARY   Patient Details  Name: Kathryn Beck MRN: [id number] DOB: 11/24/1955 ERE:Dpfednw, Rollene BRAVO, Kathryn Beck  Admission/Discharge Information   Admit Date:  17-Apr-2024  Date of Death: Date of Death: 04-20-2024  Time of Death: Time of Death: May 02, 2230  Length of Stay: 3   Principle Cause of death: cardiac arrest  Hospital Diagnoses: Principal Problem:   Septic shock- MSSA bacteremia and likely UTI Active Problems:   Hyperlipidemia   Obesity   OSA (obstructive sleep apnea)   HTN (hypertension)   ESRD on hemodialysis (HCC)   PAD (peripheral artery disease)   GAD (generalized anxiety disorder)   Type 2 diabetes mellitus with other specified complication (HCC)   PAF (paroxysmal atrial fibrillation) (HCC)   History of GI bleeding   S/P bilateral BKA (below knee amputation) (HCC)   Neuropathy due to type 2 diabetes mellitus (HCC)   Persistent atrial fibrillation (HCC)   Septic shock (HCC) Acute metabolic encephalopathy    Hospital Course:  69 year old female with history of ESRD-HD (MWF) PVD s/p B/L BKAs, left lower amputation, HFpEF, HTN, DM2, PAF who was transferred from [facility] for septic shock requiring Levophed , persistent encephalopathy and respiratory failure.  her HD line was removed due to infection She remained on BIPAP Pt remained A fib with RVR despite amiodarone  drip. BP was low. Remained encephalopathic New ECHO showed worsened biventricular failure and severe AS. D/w cardiology. D/w family- who decided to proceed with comfort care. Comfort pathways were initiated and pt passed away peacefully on the floor once moved out of ICU.       Procedures:   Consultations: cardiolgy, nephrology  The results of significant diagnostics from this hospitalization (including imaging, microbiology, ancillary and laboratory) are listed below for reference.   Significant Diagnostic Studies: ECHOCARDIOGRAM COMPLETE Result Date: 03/28/2024    ECHOCARDIOGRAM REPORT    Patient Name:   Kathryn Beck Date of Exam: 03/27/2024 Medical Rec #:  [id number]       Height:       64.0 in Accession #:    [id number]      Weight:       151.7 lb Date of Birth:  [birth year]       BSA:          1.739 m Patient Age:    68 years        BP:           105/82 mmHg Patient Gender: F               HR:           98 bpm. Exam Location:  Kathryn Beck Procedure: 2D Echo, Cardiac Doppler and Color Doppler (Both Spectral and Color            Flow Doppler were utilized during procedure). Indications:    Elevated Troponin  History:        Patient has prior history of Echocardiogram examinations, most                 recent 05/17/2023. CHF, Stroke, Arrythmias:Atrial Fibrillation;                 Risk Factors:Hypertension, Diabetes and Dyslipidemia. ESRD on                 Dialysis.  Sonographer:    Kathryn Beck RCS Referring Phys: [phone] Kathryn Beck IMPRESSIONS  1. Left ventricular ejection fraction, by estimation, is 20%. The left ventricle has severely  decreased function. The left ventricle demonstrates global hypokinesis. There is moderate left ventricular hypertrophy. Left ventricular diastolic parameters are indeterminate. Elevated left atrial pressure. The E/e' is 18.  2. Right ventricular systolic function is moderately reduced. The right ventricular size is normal. There is normal pulmonary artery systolic pressure. The estimated right ventricular systolic pressure is 31.0 mmHg.  3. Left atrial size was moderately dilated.  4. Right atrial size was mildly dilated.  5. The mitral valve is degenerative. Mild to moderate mitral valve regurgitation. No evidence of mitral stenosis. Severe mitral annular calcification.  6. The aortic valve is abnormal. There is severe calcifcation of the aortic valve. Aortic valve regurgitation is mild. Possible severe low flow low gradient aortic valve stenosis. Aortic valve area, by VTI measures 0.58 cm. Aortic valve mean gradient measures 21.0 mmHg. Aortic valve Vmax measures  3.00 m/s. DVI 0.20.  7. Patient on Bipap. The inferior vena cava is dilated in size with <50% respiratory variability, suggesting right atrial pressure of 15 mmHg. FINDINGS  Left Ventricle: Left ventricular ejection fraction, by estimation, is 20%. The left ventricle has severely decreased function. The left ventricle demonstrates global hypokinesis. The left ventricular internal cavity size was normal in size. There is moderate left ventricular hypertrophy. Left ventricular diastolic parameters are indeterminate. Elevated left atrial pressure. The E/e' is 21. Right Ventricle: The right ventricular size is normal. Right vetricular wall thickness was not well visualized. Right ventricular systolic function is moderately reduced. There is normal pulmonary artery systolic pressure. The tricuspid regurgitant velocity is 2.00 m/s, and with an assumed right atrial pressure of 15 mmHg, the estimated right ventricular systolic pressure is 31.0 mmHg. Left Atrium: Left atrial size was moderately dilated. Right Atrium: Right atrial size was mildly dilated. Pericardium: There is no evidence of pericardial effusion. Mitral Valve: The mitral valve is degenerative in appearance. Severe mitral annular calcification. Mild to moderate mitral valve regurgitation. No evidence of mitral valve stenosis. Tricuspid Valve: The tricuspid valve is grossly normal. Tricuspid valve regurgitation is mild . No evidence of tricuspid stenosis. Aortic Valve: The aortic valve is abnormal. There is severe calcifcation of the aortic valve. Aortic valve regurgitation is mild. Severe aortic stenosis is present. Aortic valve mean gradient measures 21.0 mmHg. Aortic valve peak gradient measures 36.0 mmHg. Aortic valve area, by VTI measures 0.58 cm. Pulmonic Valve: The pulmonic valve was grossly normal. Pulmonic valve regurgitation is trivial. No evidence of pulmonic stenosis. Aorta: The aortic root is normal in size and structure. Venous: The inferior vena  cava is dilated in size with less than 50% respiratory variability, suggesting right atrial pressure of 15 mmHg. IAS/Shunts: No atrial level shunt detected by color flow Doppler.  LEFT VENTRICLE PLAX 2D LVIDd:         4.60 cm LVIDs:         3.60 cm LV PW:         1.40 cm LV IVS:        1.50 cm LVOT diam:     1.90 cm LV SV:         32 LV SV Index:   19 LVOT Area:     2.84 cm  LV Volumes (MOD) LV vol d, MOD A4C: 98.1 ml LV vol s, MOD A4C: 55.0 ml LV SV MOD A4C:     98.1 ml RIGHT VENTRICLE TAPSE (M-mode): 1.2 cm LEFT ATRIUM              Index  RIGHT ATRIUM           Index LA diam:        4.40 cm  2.53 cm/m   RA Area:     16.50 cm LA Vol (A2C):   76.8 ml  44.15 ml/m  RA Volume:   39.30 ml  22.59 ml/m LA Vol (A4C):   112.0 ml 64.39 ml/m LA Biplane Vol: 93.8 ml  53.93 ml/m  AORTIC VALVE AV Area (Vmax):    0.64 cm AV Area (Vmean):   0.67 cm AV Area (VTI):     0.58 cm AV Vmax:           300.00 cm/s AV Vmean:          202.667 cm/s AV VTI:            0.561 m AV Peak Grad:      36.0 mmHg AV Mean Grad:      21.0 mmHg LVOT Vmax:         67.90 cm/s LVOT Vmean:        47.800 cm/s LVOT VTI:          0.114 m LVOT/AV VTI ratio: 0.20  AORTA Ao Root diam: 3.40 cm MITRAL VALVE                TRICUSPID VALVE MV Area (PHT): 5.97 cm     TR Peak grad:   16.0 mmHg MV Decel Time: 127 msec     TR Vmax:        200.00 cm/s MV E velocity: 107.00 cm/s                             SHUNTS                             Systemic VTI:  0.11 m                             Systemic Diam: 1.90 cm Kathryn Merck Kathryn Beck Electronically signed by Kathryn Merck Kathryn Beck Signature Date/Time: 03/28/2024/2:00:24 PM    Final    DG CHEST PORT 1 VIEW Result Date: 03/27/2024 EXAM: 1 VIEW(S) XRAY OF THE CHEST 03/27/2024 08:51:56 PM COMPARISON: Comparison to study dated 03/26/2024. CLINICAL HISTORY: Septic shock (HCC) [id number]; [id number] Respiratory failure (HCC) [id number]. FINDINGS: LINES, TUBES AND DEVICES: Interval removal of right dialysis catheter. LUNGS AND PLEURA:  Linear scarring or atelectasis in the lingula/left base. No pleural effusion. No pneumothorax. HEART AND MEDIASTINUM: Cardiomegaly. BONES AND SOFT TISSUES: No acute osseous abnormality. IMPRESSION: 1. Linear scarring or atelectasis in the lingula/left base. 2. Cardiomegaly. Electronically signed by: Kathryn Crease Kathryn Beck 03/27/2024 08:59 PM EST RP Workstation: [id number]   DG Radiologist Eval And Mgmt Result Date: 03/27/2024 INDICATION: Patient history of end-stage renal disease on hemodialysis admitted with concern for central line infection. Request for removal of tunneled right IJ hemodialysis catheter for line holiday. The catheter was originally placed 03/17/2024 by vascular surgery. EXAM: REMOVAL OF TUNNELED HEMODIALYSIS CATHETER MEDICATIONS: 6 mL 1% lidocaine  COMPLICATIONS: None immediate. PROCEDURE: Informed written consent was obtained from the patient's family following an explanation of the procedure, risks, benefits and alternatives to treatment. A time out was performed prior to the initiation of the procedure. Maximal barrier sterile technique was utilized including caps, mask, sterile gowns, sterile gloves, large sterile drape, hand hygiene, and ChloraPrep. 1%  lidocaine  was injected under sterile conditions along the subcutaneous tunnel. Utilizing gentle traction, the catheter was removed intact. Hemostasis was obtained with manual compression. A dressing was placed. The patient tolerated the procedure well without immediate post procedural complication. IMPRESSION: Successful removal of tunneled dialysis catheter. Performed by Kathryn Hesselbach, PA-C Electronically Signed   By: Kathryn Beck.  Shick M.D.   On: 03/27/2024 16:22   EEG adult Result Date: 03/27/2024 Kathryn Arlin KIDD, Kathryn Beck     03/27/2024  2:08 PM Patient Name: Kathryn Beck MRN: [id number] Epilepsy Attending: Arlin Beck Kathryn Referring Physician/Provider: Bryn Kathryn NOVAK, Kathryn Beck Date: 03/27/2024 Duration: 23.01 mins Patient history: 69 year old female with  altered mental status.  EEG evaluate for seizure. Level of alertness: Awake AEDs during EEG study: None Technical aspects: This EEG study was done with scalp electrodes positioned according to the 10-20 International system of electrode placement. Electrical activity was reviewed with band pass filter of 1-70Hz , sensitivity of 7 uV/mm, display speed of 49mm/sec with a 60Hz  notched filter applied as appropriate. EEG data were recorded continuously and digitally stored.  Video monitoring was available and reviewed as appropriate. Description: EEG showed continuous generalized and maximal left left parieto-occipital region 3 to 6 Hz theta-delta slowing. Sharp waves were noted in left parieto-occipital region. Hyperventilation and photic stimulation were not performed.   ABNORMALITY -Sharp wave, left parieto-occipital region - Continuous slow, generalized and maximal left left parieto-occipital region IMPRESSION: This study showed evidence of epileptogenicity and cortical dysfunction arising from left parieto-occipital region. Additionally there is generalized cerebral dysfunction (encephalopathy). No seizures were seen throughout the recording. Arlin Beck Kathryn   DG CHEST PORT 1 VIEW Result Date: 03/26/2024 CLINICAL DATA:  Tachypnea EXAM: PORTABLE CHEST 1 VIEW COMPARISON:  03/26/2024, 03/15/2024 FINDINGS: Right-sided central venous catheter tip at the SVC. Cardiomegaly and aortic atherosclerosis. Vascular congestion and mild interstitial edema. Probable small pleural effusions. No pneumothorax IMPRESSION: Cardiomegaly with vascular congestion and mild interstitial edema. Probable small pleural effusions. Electronically Signed   By: Luke Bun M.D.   On: 03/26/2024 17:11   CT CHEST ABDOMEN PELVIS W CONTRAST Result Date: 03/26/2024 EXAM: CT CHEST, ABDOMEN AND PELVIS WITH CONTRAST 03/26/2024 11:54:52 AM TECHNIQUE: CT of the chest, abdomen and pelvis was performed with the administration of 100 mL of iohexol   (OMNIPAQUE ) 300 MG/ML solution. Multiplanar reformatted images are provided for review. Automated exposure control, iterative reconstruction, and/or weight based adjustment of the mA/kV was utilized to reduce the radiation dose to as low as reasonably achievable. COMPARISON: Previous CT from 11/18/2022, 12/19/2022, and 12/16/2023. CLINICAL HISTORY: Sepsis. FINDINGS: CHEST: MEDIASTINUM AND LYMPH NODES: Aortic atherosclerosis and coronary artery calcifications. Mild cardiac enlargement. No pericardial effusion. Aortic and mitral valve calcifications. The central airways are clear. No mediastinal, hilar or axillary lymphadenopathy. LUNGS AND PLEURA: Lungs are hypoinflated. Scarring, architectural distortion and volume loss are again noted within the lung bases, similar to the previous exam. Several small lung nodules are identified appear unchanged from the previous exam. The largest is in the posterior right lower lobe measuring 5 mm, image 82/5. Stable since 11/18/2022. These are compatible with benign nodules. No focal consolidation or pulmonary edema. No pleural effusion. No pneumothorax. ABDOMEN AND PELVIS: LIVER: Geographic focal fatty infiltration deposition is identified within segment 7 and 6 of the inferior right lobe of liver. GALLBLADDER AND BILE DUCTS: Gallbladder appears normal. No biliary ductal dilatation. SPLEEN: No acute abnormality. PANCREAS: Pancreas is normal in size and contour without focal lesion or ductal dilatation. ADRENAL GLANDS: Bilateral adrenal nodules are  again noted which have been stable 12/19/2022 compatible with benign adenomas. On the left this measures 2.9 x 1.8 cm, image 52/2. Unchanged. On the right this measures 2.8 x 1.6 cm. No follow-up imaging recommended. KIDNEYS, URETERS AND BLADDER: Bilateral kidney cysts are again noted. Previously characterized enhancing lesion of the inferior pole of the right kidney measures 1.9 x 1.6 cm, increased from 1.7 x 1.6 cm previously and  remained suspicious for renal cell carcinoma. The remaining cysts have imaging characteristics typical of Bosniak class 1 and 2 kidney lesions. Per consensus, no follow-up is needed for simple Bosniak type 1 and 2 renal cysts, unless the patient has a malignancy history or risk factors. The largest cyst arises from the inferior pole, 3.6 cm. Filling defect within the right renal pelvis appears hyperdense, measuring 6 mm, image 340/4. This is unchanged from the previous exam and is indeterminate but does contain some internal calcifications as demonstrated on the unenhanced CT from 12/16/2023. There is a calcification identified at the left UVJ without significant left-sided hydronephrosis or hydroureter. The calcification is punctate, image 500 35/4 and measures 3 mm. The urinary bladder is decompressed, and there is diffuse circumferential bladder wall thickening with overlying soft tissue stranding. No perinephric or periureteral stranding. GI AND BOWEL: Stomach demonstrates no acute abnormality. The appendix is visualized and appears normal. The colon is largely decompressed with colonic wall thickness measuring up to 5 mm, likely reflecting incomplete distention given the absence of surrounding pericolonic soft tissue stranding/inflammation. Moderate well-formed desiccated stool noted within the rectum. There is no bowel obstruction. REPRODUCTIVE ORGANS: The uterus appears surgically absent. No adnexal mass. PERITONEUM AND RETROPERITONEUM: No ascites. No free air. No focal fluid collections within the abdomen or pelvis. VASCULATURE: Aorta is normal in caliber. Sclerotic and branch vessel disease. ABDOMINAL AND PELVIS LYMPH NODES: No lymphadenopathy. BONES AND SOFT TISSUES: Thoracic spondylosis. Unchanged anterior superior endplate deformity of T12. Unchanged superior endplate Schmorl's node deformity noted at L4. Focal rim-calcified fat density structure within the ventral abdomen is unchanged and most likely  the sequelae of remote fat necrosis. Fat-containing umbilical hernia. Mild diffuse body wall edema. No acute osseous abnormality, other than the described chronic changes. No focal soft tissue abnormality, other than the described findings. IMPRESSION: 1. Diffuse circumferential bladder wall thickening with surrounding stranding and a 3 mm punctate calcification at the left UVJ without significant left hydroureteronephrosis, most consistent with acute cystitis/ureteritis with a distal ureteral stone follow-up urinalysis/culture. 2. Stable 6 mm hyperdense filling defect with internal calcifications in the right renal pelvis, indeterminate (differential includes nonobstructing calculus, sloughed papilla, blood clot, or upper tract urothelial neoplasm). 3. Slight interval increase in size of the enhancing right lower pole renal lesion to 1.9 x 1.6 cm, suspicious for renal cell carcinoma (differential includes oncocytoma and lipid-poor angiomyolipoma); recommend urology consultation and renal protocol MRI (preferred) or CT without and with contrast for staging/characterization and management planning. 4. Aortic atherosclerosis and coronary artery calcifications. Electronically signed by: Waddell Calk Kathryn Beck 03/26/2024 12:19 PM EST RP Workstation: [id number]   CT Head Wo Contrast Result Date: 03/26/2024 EXAM: CT HEAD WITHOUT CONTRAST 03/26/2024 11:54:52 AM TECHNIQUE: CT of the head was performed without the administration of intravenous contrast. Automated exposure control, iterative reconstruction, and/or weight based adjustment of the mA/kV was utilized to reduce the radiation dose to as low as reasonably achievable. COMPARISON: 11/10/2023 CLINICAL HISTORY: Mental status change, unknown cause. FINDINGS: BRAIN AND VENTRICLES: No acute hemorrhage. No evidence of acute infarct. No hydrocephalus. No extra-axial  collection. No mass effect or midline shift. Stable encephalomalacia within the left parieto-occipital region.  Atherosclerotic calcifications within the cavernous internal carotid arteries. ORBITS: No acute abnormality. SINUSES: No acute abnormality. SOFT TISSUES AND SKULL: No acute soft tissue abnormality. No skull fracture. Mild opacification of the left mastoid air cells. IMPRESSION: 1. No acute intracranial abnormality. 2. Stable encephalomalacia in the left parieto-occipital region. 3. Mild left mastoid air cell opacification. Electronically signed by: Toribio Agreste Kathryn Beck 03/26/2024 12:02 PM EST RP Workstation: [id number]   DG Chest Port 1 View Result Date: 03/26/2024 EXAM: 1 VIEW(S) XRAY OF THE CHEST 03/26/2024 10:27:02 AM COMPARISON: 03/15/2024 CLINICAL HISTORY: Questionable sepsis - evaluate for abnormality FINDINGS: LINES, TUBES AND DEVICES: Right IJ central venous catheter in place with tip terminating over the superior cavoatrial junction. LUNGS AND PLEURA: Small left pleural effusion. Mild diffuse interstitial opacities, likely edema. No pneumothorax. HEART AND MEDIASTINUM: Unchanged cardiomegaly. Aortic arch atherosclerosis. BONES AND SOFT TISSUES: No acute osseous abnormality. IMPRESSION: 1. Mild diffuse interstitial opacities, likely edema. 2. Small left pleural effusion. Electronically signed by: Waddell Calk Kathryn Beck 03/26/2024 10:40 AM EST RP Workstation: [id number]   PERIPHERAL VASCULAR CATHETERIZATION Result Date: 03/17/2024 DATE OF SERVICE: 03/17/2024  PATIENT:  Kathryn Beck  69 y.o. female  PRE-OPERATIVE DIAGNOSIS:  end-stage renal disease;  POST-OPERATIVE DIAGNOSIS:  Same  PROCEDURE:  1) Ultrasound guided right internal jugular access 2) placement of tunneled dialysis catheter  SURGEON:  Kathryn SAILOR. Magda, Kathryn Beck  ASSISTANT: none  ANESTHESIA:   local  ESTIMATED BLOOD LOSS: min  LOCAL MEDICATIONS USED:  LIDOCAINE   COUNTS: confirmed correct.  PATIENT DISPOSITION:  PACU - hemodynamically stable.  Delay start of Pharmacological VTE agent (>24hrs) due to surgical blood loss or risk of bleeding: no  INDICATION  FOR PROCEDURE: Kathryn Beck is a 69 y.o. female with ESRD on HD. Her TDC was inadvertently removed. After careful discussion of risks, benefits, and alternatives the patient was offered [facility] placement. The patient understood and wished to proceed.  OPERATIVE FINDINGS: Successful placement of 19cm TDC via right internal jugular vein  DESCRIPTION OF PROCEDURE: After identification of the patient in the pre-operative holding area, the patient was transferred to the operating room. The patient was positioned supine on the operating room table.  The right neck was prepped and draped in standard fashion. A surgical pause was performed confirming correct patient, procedure, and operative location.  Using ultrasound guidance the right internal jugular vein was accessed with micropuncture technique.  Through the micropuncture sheath a floppy J-wire was advanced into the superior vena cava.  A small incision was made around the skin access point.  The access point was serially dilated under direct fluoroscopic guidance.  A peel-away sheath was introduced into the superior vena cava under fluoroscopic guidance.  A counterincision was made in the chest under the clavicle.  A 19 cm tunnel dialysis catheter was then tunneled under the skin, over the clavicle into the incision in the neck.  The tunneling device was removed and the catheter fed through the peel-away sheath into the superior vena cava.  The peel-away sheath was removed and the catheter gently pulled back.  Adequate position was confirmed with x-ray.  The catheter was tested and found to flush and draw back well.  Catheter was heparin  locked.  Caps were applied.  Catheter was sutured to the skin.  The neck incision was closed with 4-0 Monocryl.  Upon completion of the case instrument and sharps counts were confirmed correct. The patient was transferred  to the PACU in good condition. I was present for all portions of the procedure.  PLAN: OK to use TDC. Follow up as  scheduled.  Kathryn SAILOR. Magda, Kathryn Beck FACS [facility] Phone Number: [phone] 03/17/2024 10:46 AM   DG Chest 2 View Result Date: 03/15/2024 CLINICAL DATA:  Malfunctioning dialysis port EXAM: CHEST - 2 VIEW COMPARISON:  December 06, 2023 FINDINGS: Mild cardiomegaly. Right internal jugular dialysis catheter appears to have been significantly retracted with distal tip in soft tissues common not in vein. Mild central pulmonary vascular congestion is noted. Minimal left basilar subsegmental atelectasis is noted with small left pleural effusion. Bony thorax is unremarkable. IMPRESSION: Right internal jugular dialysis catheter appears to have been significantly retracted with distal tip in soft tissues, not in vein. Mild cardiomegaly with mild central pulmonary vascular congestion. Electronically Signed   By: Lynwood Landy Raddle M.D.   On: 03/15/2024 10:37    Microbiology: Recent Results (from the past 240 hours)  Resp panel by RT-PCR (RSV, Flu A&B, Covid) Anterior Nasal Swab     Status: None   Collection Time: 03/24/24 12:52 PM   Specimen: Anterior Nasal Swab  Result Value Ref Range Status   SARS Coronavirus 2 by RT PCR NEGATIVE NEGATIVE Final    Comment: (NOTE) SARS-CoV-2 target nucleic acids are NOT DETECTED.  The SARS-CoV-2 RNA is generally detectable in upper respiratory specimens during the acute phase of infection. The lowest concentration of SARS-CoV-2 viral copies this assay can detect is 138 copies/mL. A negative result does not preclude SARS-Cov-2 infection and should not be used as the sole basis for treatment or other patient management decisions. A negative result may occur with  improper specimen collection/handling, submission of specimen other than nasopharyngeal swab, presence of viral mutation(s) within the areas targeted by this assay, and inadequate number of viral copies(<138 copies/mL). A negative result must be combined with clinical  observations, patient history, and epidemiological information. The expected result is Negative.  Fact Sheet for Patients:  bloggercourse.com  Fact Sheet for Healthcare Providers:  seriousbroker.it  This test is no t yet approved or cleared by the [place]  FDA and  has been authorized for detection and/or diagnosis of SARS-CoV-2 by FDA under an Emergency Use Authorization (EUA). This EUA will remain  in effect (meaning this test can be used) for the duration of the COVID-19 declaration under Section 564(b)(1) of the Act, 21 U.S.C.section 360bbb-3(b)(1), unless the authorization is terminated  or revoked sooner.       Influenza A by PCR NEGATIVE NEGATIVE Final   Influenza B by PCR NEGATIVE NEGATIVE Final    Comment: (NOTE) The Xpert Xpress SARS-CoV-2/FLU/RSV plus assay is intended as an aid in the diagnosis of influenza from Nasopharyngeal swab specimens and should not be used as a sole basis for treatment. Nasal washings and aspirates are unacceptable for Xpert Xpress SARS-CoV-2/FLU/RSV testing.  Fact Sheet for Patients: bloggercourse.com  Fact Sheet for Healthcare Providers: seriousbroker.it  This test is not yet approved or cleared by the [place]  FDA and has been authorized for detection and/or diagnosis of SARS-CoV-2 by FDA under an Emergency Use Authorization (EUA). This EUA will remain in effect (meaning this test can be used) for the duration of the COVID-19 declaration under Section 564(b)(1) of the Act, 21 U.S.C. section 360bbb-3(b)(1), unless the authorization is terminated or revoked.     Resp Syncytial Virus by PCR NEGATIVE NEGATIVE Final    Comment: (NOTE) Fact Sheet for  Patients: bloggercourse.com  Fact Sheet for Healthcare Providers: seriousbroker.it  This test is not yet approved or cleared by  the [place]  FDA and has been authorized for detection and/or diagnosis of SARS-CoV-2 by FDA under an Emergency Use Authorization (EUA). This EUA will remain in effect (meaning this test can be used) for the duration of the COVID-19 declaration under Section 564(b)(1) of the Act, 21 U.S.C. section 360bbb-3(b)(1), unless the authorization is terminated or revoked.  Performed at [facility], [address] [place], [place] [address]   Blood Culture (routine x 2)     Status: Abnormal   Collection Time: 03/26/24 10:21 AM   Specimen: BLOOD RIGHT FOREARM  Result Value Ref Range Status   Specimen Description   Final    BLOOD RIGHT FOREARM BLOOD Performed at [facility], [address], [place], [place] [address]    Special Requests   Final    BOTTLES DRAWN AEROBIC AND ANAEROBIC Blood Culture adequate volume Performed at [facility], [address], [place], [place] [address]    Culture  Setup Time   Final    GRAM POSITIVE COCCI IN BOTH AEROBIC AND ANAEROBIC BOTTLES CRITICAL RESULT CALLED TO, READ BACK BY AND VERIFIED WITH: KINDLY,C ON 03/27/24 AT 0208 BY PURDIE,J REVA GPC BY DD 03/27/2024 @ 0545 CRITICAL VALUE NOTED.  VALUE IS CONSISTENT WITH PREVIOUSLY REPORTED AND CALLED VALUE.    Culture (A)  Final    STAPHYLOCOCCUS AUREUS SUSCEPTIBILITIES PERFORMED ON PREVIOUS CULTURE WITHIN THE LAST 5 DAYS. Performed at [facility], [address] [place], [place] [address]    Report Status Apr 06, 2024 FINAL  Final  Blood Culture (routine x 2)     Status: Abnormal   Collection Time: 03/26/24 12:40 PM   Specimen: BLOOD  Result Value Ref Range Status   Specimen Description   Final    BLOOD LEFT ANTECUBITAL Performed at [facility], [address] [place], [place] [address]    Special Requests   Final    AEROBIC BOTTLE ONLY Blood Culture adequate volume Performed at [facility], [address] [place], [place] [address]    Culture  Setup Time   Final    GRAM POSITIVE  COCCI AEROBIC BOTTLE ONLY CRITICAL RESULT CALLED TO, READ BACK BY AND VERIFIED WITH: KINDLY,C ON 03/27/24 AT 0208 BY PURDIE,J  REVA GPC BY DD 03/27/2024 @ 0538 Organism ID to follow  CODY [facility] CHARGE RN 03/27/2024 BY DD @ 0650 Performed at [facility], [address], [place], [place] [address]    Culture STAPHYLOCOCCUS AUREUS (A)  Final   Report Status 04-06-2024 FINAL  Final   Organism ID, Bacteria STAPHYLOCOCCUS AUREUS  Final      Susceptibility   Staphylococcus aureus - MIC*    CIPROFLOXACIN  >=8 RESISTANT Resistant     ERYTHROMYCIN >=8 RESISTANT Resistant     GENTAMICIN <=0.5 SENSITIVE Sensitive     OXACILLIN 0.5 SENSITIVE Sensitive     TETRACYCLINE >=16 RESISTANT Resistant     VANCOMYCIN  <=0.5 SENSITIVE Sensitive     TRIMETH /SULFA  160 RESISTANT Resistant     CLINDAMYCIN  >=8 RESISTANT Resistant     RIFAMPIN <=0.5 SENSITIVE Sensitive     Inducible Clindamycin  NEGATIVE Sensitive     LINEZOLID 2 SENSITIVE Sensitive     * STAPHYLOCOCCUS AUREUS  Blood Culture ID Panel (Reflexed)     Status: Abnormal   Collection Time: 03/26/24 12:40 PM  Result Value Ref Range Status   Enterococcus faecalis NOT DETECTED NOT DETECTED  Final   Enterococcus Faecium NOT DETECTED NOT DETECTED Final   Listeria monocytogenes NOT DETECTED NOT DETECTED Final   Staphylococcus species DETECTED (A) NOT DETECTED Final    Comment: CRITICAL RESULT CALLED TO, READ BACK BY AND VERIFIED WITH:  CODY KINDLEY CHARGE RN 03/27/2024 BY DD @ 0650    Staphylococcus aureus (BCID) DETECTED (A) NOT DETECTED Final    Comment: CRITICAL RESULT CALLED TO, READ BACK BY AND VERIFIED WITH:  CODY KINDLEY CHARGE RN 03/27/2024 BY DD @ 0650    Staphylococcus epidermidis NOT DETECTED NOT DETECTED Final   Staphylococcus lugdunensis NOT DETECTED NOT DETECTED Final   Streptococcus species NOT DETECTED NOT DETECTED Final   Streptococcus agalactiae NOT DETECTED NOT DETECTED Final   Streptococcus pneumoniae NOT DETECTED NOT DETECTED Final    Streptococcus pyogenes NOT DETECTED NOT DETECTED Final   A.calcoaceticus-baumannii NOT DETECTED NOT DETECTED Final   Bacteroides fragilis NOT DETECTED NOT DETECTED Final   Enterobacterales NOT DETECTED NOT DETECTED Final   Enterobacter cloacae complex NOT DETECTED NOT DETECTED Final   Escherichia coli NOT DETECTED NOT DETECTED Final   Klebsiella aerogenes NOT DETECTED NOT DETECTED Final   Klebsiella oxytoca NOT DETECTED NOT DETECTED Final   Klebsiella pneumoniae NOT DETECTED NOT DETECTED Final   Proteus species NOT DETECTED NOT DETECTED Final   Salmonella species NOT DETECTED NOT DETECTED Final   Serratia marcescens NOT DETECTED NOT DETECTED Final   Haemophilus influenzae NOT DETECTED NOT DETECTED Final   Neisseria meningitidis NOT DETECTED NOT DETECTED Final   Pseudomonas aeruginosa NOT DETECTED NOT DETECTED Final   Stenotrophomonas maltophilia NOT DETECTED NOT DETECTED Final   Candida albicans NOT DETECTED NOT DETECTED Final   Candida auris NOT DETECTED NOT DETECTED Final   Candida glabrata NOT DETECTED NOT DETECTED Final   Candida krusei NOT DETECTED NOT DETECTED Final   Candida parapsilosis NOT DETECTED NOT DETECTED Final   Candida tropicalis NOT DETECTED NOT DETECTED Final   Cryptococcus neoformans/gattii NOT DETECTED NOT DETECTED Final   Meth resistant mecA/C and MREJ NOT DETECTED NOT DETECTED Final    Comment: Performed at [facility], [address], [place], [place] [address]  MRSA Next Gen by PCR, Nasal     Status: None   Collection Time: 03/26/24  5:28 PM   Specimen: Nasal Mucosa; Nasal Swab  Result Value Ref Range Status   MRSA by PCR Next Gen NOT DETECTED NOT DETECTED Final    Comment: (NOTE) The GeneXpert MRSA Assay (FDA approved for NASAL specimens only), is one component of a comprehensive MRSA colonization surveillance program. It is not intended to diagnose MRSA infection nor to guide or monitor treatment for MRSA infections. Test performance is not FDA  approved in patients less than 67 years old. Performed at [facility], [address], [place], [place] [address]   Respiratory (~20 pathogens) panel by PCR     Status: None   Collection Time: 03/26/24  5:28 PM   Specimen: Nasopharyngeal Swab; Respiratory  Result Value Ref Range Status   Adenovirus NOT DETECTED NOT DETECTED Final   Coronavirus 229E NOT DETECTED NOT DETECTED Final    Comment: (NOTE) The Coronavirus on the Respiratory Panel, DOES NOT test for the novel  Coronavirus (2019 nCoV)    Coronavirus HKU1 NOT DETECTED NOT DETECTED Final   Coronavirus NL63 NOT DETECTED NOT DETECTED Final   Coronavirus OC43 NOT DETECTED NOT DETECTED Final   Metapneumovirus NOT DETECTED NOT DETECTED Final   Rhinovirus / Enterovirus NOT DETECTED NOT DETECTED Final  Influenza A NOT DETECTED NOT DETECTED Final   Influenza B NOT DETECTED NOT DETECTED Final   Parainfluenza Virus 1 NOT DETECTED NOT DETECTED Final   Parainfluenza Virus 2 NOT DETECTED NOT DETECTED Final   Parainfluenza Virus 3 NOT DETECTED NOT DETECTED Final   Parainfluenza Virus 4 NOT DETECTED NOT DETECTED Final   Respiratory Syncytial Virus NOT DETECTED NOT DETECTED Final   Bordetella pertussis NOT DETECTED NOT DETECTED Final   Bordetella Parapertussis NOT DETECTED NOT DETECTED Final   Chlamydophila pneumoniae NOT DETECTED NOT DETECTED Final   Mycoplasma pneumoniae NOT DETECTED NOT DETECTED Final    Comment: Performed at [facility], [address], [place], [place] [address]  Culture, blood (Routine X 2) w Reflex to ID Panel     Status: None (Preliminary result)   Collection Time: 03/27/24  8:16 PM   Specimen: BLOOD RIGHT ARM  Result Value Ref Range Status   Specimen Description BLOOD RIGHT ARM  Final   Special Requests   Final    AEROBIC BOTTLE ONLY Blood Culture results may not be optimal due to an inadequate volume of blood received in culture bottles   Culture   Final    NO GROWTH 3 DAYS Performed at [facility], [address] [place], [place] [address]    Report Status PENDING  Incomplete  Culture, blood (Routine X 2) w Reflex to ID Panel     Status: None (Preliminary result)   Collection Time: 03/27/24  8:16 PM   Specimen: BLOOD RIGHT ARM  Result Value Ref Range Status   Specimen Description BLOOD RIGHT ARM  Final   Special Requests   Final    AEROBIC BOTTLE ONLY Blood Culture results may not be optimal due to an inadequate volume of blood received in culture bottles   Culture   Final    NO GROWTH 3 DAYS Performed at [facility], [address] [place], [place] [address]    Report Status PENDING  Incomplete    Time spent: 30 minutes  Signed: Briston Lax Pleas, Kathryn Beck April 08, 2024     "

## 2024-04-23 NOTE — Progress Notes (Addendum)
 Family called this RN on the room ,assessed the patient ,breathing was absent, on auscultation heartbeat was  absent ,pupils fixed and dilated, verified by two RN and patient was pronounced death at 04/30/2230.Informed the on call Dr Epimenio ,all the after death care was done and completed, Inj Morphine  50ml was wasted and was witness by [employer].

## 2024-04-23 DEATH — deceased

## 2024-05-04 ENCOUNTER — Ambulatory Visit: Admitting: Family Medicine

## 2024-05-16 ENCOUNTER — Ambulatory Visit: Admitting: Gastroenterology

## 2024-05-23 ENCOUNTER — Ambulatory Visit: Admitting: Family Medicine

## 2024-05-23 ENCOUNTER — Inpatient Hospital Stay

## 2024-05-30 ENCOUNTER — Inpatient Hospital Stay: Admitting: Physician Assistant

## 2024-09-12 ENCOUNTER — Ambulatory Visit
